# Patient Record
Sex: Female | Born: 1990 | Race: Black or African American | Hispanic: No | Marital: Single | Smoking: Never smoker
Health system: Southern US, Community
[De-identification: ages and names within clinical notes are randomized; demographics above are authoritative.]

## PROBLEM LIST (undated history)

## (undated) ENCOUNTER — Emergency Department (HOSPITAL_COMMUNITY): Discharge status: Absent without leave | Payer: Medicaid Other

## (undated) ENCOUNTER — Inpatient Hospital Stay (HOSPITAL_COMMUNITY): Payer: Self-pay

## (undated) DIAGNOSIS — R160 Hepatomegaly, not elsewhere classified: Secondary | ICD-10-CM

## (undated) DIAGNOSIS — F419 Anxiety disorder, unspecified: Secondary | ICD-10-CM

## (undated) DIAGNOSIS — J45909 Unspecified asthma, uncomplicated: Secondary | ICD-10-CM

## (undated) DIAGNOSIS — K3184 Gastroparesis: Secondary | ICD-10-CM

## (undated) DIAGNOSIS — K219 Gastro-esophageal reflux disease without esophagitis: Secondary | ICD-10-CM

## (undated) DIAGNOSIS — E109 Type 1 diabetes mellitus without complications: Secondary | ICD-10-CM

## (undated) DIAGNOSIS — I10 Essential (primary) hypertension: Secondary | ICD-10-CM

## (undated) DIAGNOSIS — K760 Fatty (change of) liver, not elsewhere classified: Secondary | ICD-10-CM

## (undated) DIAGNOSIS — E1143 Type 2 diabetes mellitus with diabetic autonomic (poly)neuropathy: Secondary | ICD-10-CM

## (undated) DIAGNOSIS — M1990 Unspecified osteoarthritis, unspecified site: Secondary | ICD-10-CM

## (undated) DIAGNOSIS — J189 Pneumonia, unspecified organism: Secondary | ICD-10-CM

## (undated) DIAGNOSIS — R1115 Cyclical vomiting syndrome unrelated to migraine: Secondary | ICD-10-CM

## (undated) DIAGNOSIS — K859 Acute pancreatitis without necrosis or infection, unspecified: Secondary | ICD-10-CM

## (undated) DIAGNOSIS — R011 Cardiac murmur, unspecified: Secondary | ICD-10-CM

## (undated) DIAGNOSIS — K8020 Calculus of gallbladder without cholecystitis without obstruction: Secondary | ICD-10-CM

## (undated) HISTORY — PX: WISDOM TOOTH EXTRACTION: SHX21

---
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
## 2009-10-10 ENCOUNTER — Ambulatory Visit: Payer: Self-pay | Admitting: "Endocrinology

## 2009-12-24 ENCOUNTER — Ambulatory Visit: Payer: Self-pay | Admitting: "Endocrinology

## 2010-01-02 ENCOUNTER — Ambulatory Visit: Payer: Self-pay | Admitting: "Endocrinology

## 2010-04-03 ENCOUNTER — Ambulatory Visit: Payer: Self-pay | Admitting: "Endocrinology

## 2010-05-07 ENCOUNTER — Ambulatory Visit: Payer: Self-pay | Admitting: "Endocrinology

## 2010-06-03 NOTE — Discharge Summary (Signed)
[name], VANORMAN NO.:  [id number]   MEDICAL RECORD NO.:  [id number]          PATIENT TYPE:  INP   LOCATION:  6123                         FACILITY:  [facility]   PHYSICIAN:  Orie Rout, M.D.DATE OF BIRTH:  [birth year]   DATE OF ADMISSION:  10/23/2007  DATE OF DISCHARGE:  11/02/2007                               DISCHARGE SUMMARY   REASON FOR HOSPITALIZATION:  Cellulitis and Type 1 Diabetes.   The patient is an 20 year old female with type 1 diabetes, hypertension,  and asthma who presented with cellulitis of the right buttocks and hip,  likely from insulin pump injection site.  The patient was initially  started on IV clindamycin and then transitioned to oral . clindamycin.  However, the patient continued to spike fevers and clinically did not  improve, so an ultrasound was obtained which did not show any abscess or  drainable fluid collection.  The patient was then switched to IV  vancomycin.  The patient continued to have fevers and then developed  right lower leg swelling.  The patient got Dopplers to rule out DVT and  MRI to rule out muscle necrosis, osteomyelitis, and myositis.  The  patient also complained of pain with inspiration.   Chest x-ray showed left pleural effusion and atelectasis.  D-dimers were  drawn and were 0.973.  Blood cultures were drawn over the course of the  patient's stay and are all no growth to date.  On day of discharge, the  patient is breathing comfortably on room air.  She is afebrile and  taking adequate p.o. intake.   OPERATIONS AND PROCEDURES:  1. Ultrasound on right buttock and hip.  2. Chest x-ray.  3. MRI of right leg.  4. Right lower leg Doppler.   FINAL DIAGNOSES:  1. Right buttock hip cellulitis  2. Parapneumonic effusion.   DISCHARGE MEDICATIONS AND INSTRUCTIONS:  1. Azithromycin 250 mg p.o. daily x3 days.  2. Linezolid 600 mg p.o. b.i.d. x7 days.   Please seek medical attention for increased work of  breathing, coughing  of blood, increased pain with breathing, or any other medical concerns.   PENDING RESULTS:  None.   FOLLOWUP:  Follow up with Dr. Cori Razor at [facility] on November 07, 2007, at  2 p.m.   DISCHARGE WEIGHT:  35 kilos.   DISCHARGE CONDITION:  Stable and improved.      Pediatrics Resident      Orie Rout, M.D.  Electronically Signed    PR/MEDQ  D:  11/02/2007  T:  11/03/2007  Job:  [id number]

## 2010-06-03 NOTE — Consult Note (Signed)
NAMEMarland Braun  [name], [name] NO.:  [id number]   MEDICAL RECORD NO.:  [id number]          PATIENT TYPE:  INP   LOCATION:  6153                         FACILITY:  [facility]   PHYSICIAN:  David Stall, M.D.DATE OF BIRTH:  25-Jan-1990   DATE OF CONSULTATION:  04/20/2007  DATE OF DISCHARGE:  04/22/2007                                 CONSULTATION   SOURCE OF CONSULTATION:  Dr. Posey Rea, pediatric teaching service.   CHIEF COMPLAINT:  Asthma, shortness of breath, type 1 diabetes mellitus,  and recurrent diabetic ketoacidosis.   HISTORY OF PRESENT ILLNESS:  [name] is a 20-year-old African-  American female.  She was interviewed both privately and in the presence  of her mother and sister.   [name] was admitted on April 19, 2007 for severe asthma exacerbation  and shortness of breath.  Three weeks prior to admission, she had an  acute gastroenteritis which resolved.  She developed URI symptoms for  several days prior to this admission.  She had an albuterol inhaler at  home but did not use it.  When she presented to the emergency room on  the day of admission, she was given prednisone 60 mg dose.   Patient has had type 1 diabetes mellitus for about four years.  She has  been on insulin pumps since early 2007 and uses Apidra insulin in her  pump.  She has a long history of noncompliance.  She has had multiple  admissions to the PICU for diabetic ketoacidosis.  Sometimes these have  been triggered by intercurrent illness, sometimes by noncompliance per  say, and sometimes by both.   She was last seen at [facility] on February 22, 2007.  Hemoglobin A1C was 11.  At that point, she was checking her CBGs 2-3 times per day.  She would  take 0-3 boluses of Apidra insulin per day.  She also appeared to be  using control solution in place of her own blood on many of the blood  tests.  She has done this in the past.  In the case, she was having many  values in the 142-149 range,  consistent with the range of the control  solution.   PAST MEDICAL HISTORY:  1. She has asthma, as noted.  2. Goiter.  3. Autonomic neuropathy with tachycardia.  4. Hypertension.  5. Oligomenorrhea.  6. Depression.  7. Surgical history:  None.  8. Allergies:  No known drug allergies.  9. GYN:  She had her last menstrual period the second week of March.      She has been fairly regular in the past few months.   SOCIAL HISTORY:  She lives with her parents and three siblings.  She is  the 11th grade.   FAMILY HISTORY:  Her mom has type 1 diabetes mellitus, which was  diagnosed in her 30s.  There is some atherosclerotic heart disease in  both paternal grandparents.   REVIEW OF SYSTEMS:  Otherwise unremarkable.   PHYSICAL EXAMINATION:  Temperature 36.5, heart rate 105, blood pressure  142/49.  Her peak blood sugar had been 497 at 1700 but it had dropped  down to the 180s range.  GENERAL:  She was alert and oriented to person, place, and time.  Her  affect was normal.  She was breathing comfortably at the time I saw her.  EYES:  The eyes were slightly dry.  MOUTH:  Slightly dry.  NECK:  She has an 18-20 gm goiter.  The goiter is nontender.  LUNGS:  Clear.  She moves air well.  HEART SOUNDS:  S1 and S2 are normal.  ABDOMEN:  Abdomen is soft and nontender.  HANDS:  She had no symptoms.  LEGS:  She had no edema.  NEUROLOGIC:  Sensation was intact in her legs.  She had 5+ strength in  her upper and lower extremities.   ASSESSMENT:  1. Diabetes mellitus:  She has poor control secondary to      noncompliance.  2. Asthma:  She had an acute exacerbation.  This improved during the      hospitalization.  3. Diabetic ketoacidosis:  Resolving.  4. Dehydration:  This was also resolving.   HOSPITAL COURSE:  We put the patient back on her insulin pump and  tapered her insulin infusion over several hours.  On the second day,  being back on her insulin pump, she had several blood glucose  values in  the 40s and 50s.  I then reduced her basal rates by approximately 15%  at each setting.  On April 22, 2007, that brought her blood glucose  values up into the 100s to 200s range.  I made some further increases in  her basal rates at 1700, at midnight, and 0200. At the time of  discharge, she was feeling well.   PLAN:  1. Patient will continue on her current insulin pump settings.  2. She will call me in the middle of the week next week to describe to      me her follow-up blood sugar pattern.  We will determine her follow-      up plan at her clinic visits at that time.           ______________________________  David Stall, M.D.     MJB/MEDQ  D:  04/22/2007  T:  04/23/2007  Job:  [id number]

## 2010-06-03 NOTE — Discharge Summary (Signed)
[name], BRYAND NO.:  [id number]   MEDICAL RECORD NO.:  [id number]          PATIENT TYPE:  INP   LOCATION:  6153                         FACILITY:  [facility]   PHYSICIAN:  Pediatrics Resident    DATE OF BIRTH:  September 24, 1990   DATE OF ADMISSION:  04/19/2007  DATE OF DISCHARGE:  04/22/2007                               DISCHARGE SUMMARY   REASON FOR HOSPITALIZATION:  Diabetic ketoacidosis and wheezing.   SIGNIFICANT FINDINGS.:  Ms. [name] is a 20 year old with known type 1  diabetes admitted with DKA and with question of wheezing.  Wheezing  resolved after first few hours of admission with albuterol and  prednisone.  DKA resolved on insulin pump with 2 bags of IV fluid  resuscitation.  She was transitioned back to her insulin pump and  stabilized on her current regimen.  Her initial pH was 7.12, bicarb was  16, and hemoglobin A1c was 10.5.  Her ketosis resolved, and she was  stable on regular diet on her pump prior to discharge.   TREATMENT:  1. Insulin pump.  2. IV fluids.  3. Albuterol.  4. Prednisone x1 dose.  5. Increased basal rate on insulin pump and restarted back on her      pump.   OPERATIONS/PROCEDURES:  None.   FINAL DIAGNOSES:  1. Diabetic ketoacidosis, resolved.  2. Type 1 diabetes.   DISCHARGE MEDICATIONS AND INSTRUCTIONS:  Insulin pump basal rate at  2400, 0.85; 0400, 1.05; 0800, 1.80; 1700, 1.05; factor 30 one U/10 g of  carbs.  Please continue carbohydrate-consistent diet.  Other medications include;  1. Lisinopril 5 mg p.o. daily.  2. Imitrex 100 mg p.o. p.r.n. migraine.  3. Albuterol MDI 2 puffs with spacer q. 4 hours p.r.n. wheezing.   PENDING RESULTS ISSUES TO BE FOLLOWED:  None.   FOLLOWUP:  1. At [facility] on April 26, 2007, at 9:30 a.m.  2. With Dr. Apolinar Junes of endocrinology.  The patient will call Tuesday      and Wednesday and follow up per Dr. Apolinar Junes.   DISCHARGE WEIGHT:  57 kg.   DISCHARGE CONDITION:  Good.   This was faxed to the primary care doctor, Dr. Carlynn Purl at [facility] and  also faxed to Dr. Apolinar Junes.      Pediatrics Resident     PR/MEDQ  D:  04/22/2007  T:  04/23/2007  Job:  [id number]

## 2010-06-06 NOTE — Discharge Summary (Signed)
NAMEMarland Kitchen  [name], [name] NO.:  [id number]   MEDICAL RECORD NO.:  [id number]          PATIENT TYPE:  INP   LOCATION:  6155                         FACILITY:  [facility]   PHYSICIAN:  Flint Melter, MD      DATE OF BIRTH:  1990-10-19   DATE OF ADMISSION:  11/27/2004  DATE OF DISCHARGE:  11/28/2004                                 DISCHARGE SUMMARY   PRIMARY CARE PHYSICIAN:  [facility].   CONSULTATIONS:  Dr. Marita Kansas, pediatric endocrinology.   REASON FOR ADMISSION:  Diabetic ketoacidosis.   HOSPITAL COURSE:  [name] is a 20 year old African-American female with  insulin-dependent diabetes who was admitted with nausea, vomiting, elevated  blood glucose to the 200s, and large ketones in urine. On presentation to  the emergency department, her initial chemistries showed an anion gap of 12  with serum bicarbonate of 12 and acidosis on her venous blood gas of  7.27/35.  She was given normal saline boluses and started on lactated  Ringers, IV fluids, and her home insulin regimen which included Lantus at  night and sliding scale insulin with carbohydrate counting.  With the IV  fluids and home regimen of insulin, her acidosis corrected, and her urine  ketones cleared.  She tolerated a regular diet and remained off IV fluids  for several hours before discharge.   FINAL DIAGNOSES:  1.  Diabetic ketoacidosis.  2.  Insulin-dependent diabetes mellitus.   DISCHARGE MEDICATIONS:  1.  Lantus 42 units subcutaneously nightly.  2.  NovoLog sliding scale insulin with carbohydrate counting.   FOLLOW UP:  Please follow up with Dr. Marita Kansas of pediatric endocrinology next  week as directed.     ______________________________  Pediatrics Resident    ______________________________  Flint Melter, MD   PR/MEDQ  D:  11/29/2004  T:  11/30/2004  Job:  [id number]   cc:   [facility]   Dr. Earl Many  FAX [phone]  [facility]

## 2010-06-06 NOTE — Discharge Summary (Signed)
NAMEMarland Braun  AYNSLEY, FLEET NO.:  [id number]   MEDICAL RECORD NO.:  [id number]          PATIENT TYPE:  INP   LOCATION:  6153                         FACILITY:  [facility]   PHYSICIAN:  Benn Moulder, M.D.      DATE OF BIRTH:  1990/12/09   DATE OF ADMISSION:  12/03/2004  DATE OF DISCHARGE:  12/05/2004                                 DISCHARGE SUMMARY   DISCHARGE DIAGNOSES:  1.  Type 1 diabetes mellitus.  2.  Diabetic ketoacidosis.   CONSULTATIONS:  1.  Dr. Fransico Michael, Pediatric Endocrinologist.  2.  Dr. Lindie Braun, Pediatric Psychologist.   ADMISSION LABORATORY DATA:  Venous pH 7.024, pCO2 22.9, sodium 132,  potassium 5.5, chloride 114, bicarb 6, BUN 21, creatinine 0.6, glucose 422.  Urine pregnancy negative. LFT's were within normal limits as were amylase  and lipase. White blood cell count 21.5. Hemoglobin and hematocrit 15.6 and  46.5. Platelets 517,000. Urinalysis showed a glucose greater than 1,000,  ketones greater than 80, and was negative for nitrite and leukocyte  esterase.   HISTORY OF PRESENT ILLNESS:  The patient is a 20 year old female with a  history of type 1 diabetes mellitus diagnosed in January of 2006, who  presented to the emergency department with vomiting and abdominal pain x1  day and was found to have ketones in her urine when she checked it at home.  Laboratory data in the emergency room showed DKA with a pH of 7.024, a  glucose of 422, and a bicarb of 6. Of note, the patient had recently been  admitted to the hospital last week and discharge on November 28, 2004 for a  similar episode of DKA.   HOSPITAL COURSE:  The patient was admitted to the Pediatric Intensive Care  Unit and re-hydrated aggressively with IV fluids. An insulin drip was run at  0.04 mg per kg per hour until the acidosis corrected. The patient was then  restarted on her home regimen of Lantus 42 units SQ q.h.s. as well as  NovoLog sliding scale correction and carb dosing. The patient  was tolerating  a regular diet prior to discharge. The patient received extensive teaching  from Dr. Fransico Michael on the importance of checking her blood sugars as directed  and taking her insulin as scheduled. The patient's mother was also made  aware of this importance and she stated that she would help out with  checking Arodhi'kas sugars, to make sure that she is compliant. The patient  also met with the Pediatric Psychologist, Dr. Lindie Braun.   DISCHARGE MEDICATIONS:  1.  Lantus 42 units SQ q.h.s.  2.  NovoLog insulin based on correction dose and carb counting as directed      by Dr. Fransico Michael.   DISCHARGE INSTRUCTIONS:  1.  The patient is to check blood sugars before and after meals and as      directed by Dr. Fransico Michael.  2.  The patient is also advised to drink plenty of fluids.   DISCHARGE WEIGHT:  48.8 kg.   CONDITION ON DISCHARGE:  Improved.   FOLLOW UP:  1.  Dr. Carlynn Purl at [facility] on December 08, 2004 at 4:15 p.m. (Fax# [phone])  2.  Dr. Fransico Michael. The patient will call his office to schedule an      appointment. (Fax# [phone])      Benn Moulder, M.D.     MR/MEDQ  D:  12/05/2004  T:  12/05/2004  Job:  [id number]   cc:   David Stall, M.D.  Fax: [phone]   Maia Breslow, M.D.  Fax: [phone]

## 2010-06-06 NOTE — Consult Note (Signed)
NAMEMarland Braun  JENNIER, [name]:  [id number]   MEDICAL RECORD NO.:  [id number]          PATIENT TYPE:  INP   LOCATION:  6120                         FACILITY:  [facility]   PHYSICIAN:  David Stall, M.D.DATE OF BIRTH:  1990-09-14   DATE OF CONSULTATION:  01/27/2006  DATE OF DISCHARGE:                                 CONSULTATION   CHIEF COMPLAINT:  This patient was admitted on January 25, 2006, for  recurrence of diabetic ketoacidosis, in association with known type 1  diabetes mellitus.   HISTORY OF PRESENT ILLNESS:  [name] is a 20 year old  African/American female.  I saw her and interviewed her privately.  Her  parents were not available at the time.  I have also reviewed the  medical record.  The patient was admitted by the emergency department  for DKA on January 25, 2006.  In retrospect, the patient complained of  having some nausea two nights prior to admission.  She felt better the  next day.  Moreover, the night prior to admission she had more nausea.  On the day of admission she had some nausea and vomiting.  She  attributed this to some stomach flu that other family members had.  She  was therefore brought to the emergency department.  In the emergency  department serum sodium was 130, potassium 4.9, chloride 106 and CO2 of  9.3.  Her glucose was 443.  Her pH was 7.178.  She was placed on an  insulin drip and given flow __________  insulin.  She was then  transferred to the floor when the DKA resolved.  The patient has been on  an insulin pump since February 20, 2005.  She has a Medtronic Mini-Med  Paradim-715 pump.  She currently uses Apidra insulin in her pump.  Her  settings are an insulin to carbohydrate ratio of 10, an insulin  sensitivity of __________  and a blood glucose target of 110.  Her basal  rates are as follows:  As of midnight her basal rate is 0.9, at 4 a.m.  is 1.1, at 8 a.m. is 1.0 and at 5 p.m. is 1.1.   The patient was last seen  on February 02, 2005.  At that time her  hemoglobin A1c was 11.  It was clear that she was not being compliant  with her insulin plan.   PAST MEDICAL HISTORY:  1. She has had type 1 diabetes mellitus for two years.  2. She has frequent hypoglycemia.  3. She also has had tachycardia, secondary to autonomic neuropathy.  4. She also has a goiter.   PAST SURGICAL HISTORY:  None.   ALLERGIES:  No known drug allergies.   REVIEW OF SYSTEMS:  GYN:  Her last menstrual period was the week prior  to Christmas.  The patient now feels well.   SOCIAL HISTORY:  She lives with her parents.  She is in the 10th grade.   FAMILY HISTORY:  The patient's mother has type 1 diabetes mellitus.   PHYSICAL EXAMINATION:  VITAL SIGNS:  Temperature 36.6 degrees C, heart  rate 86, blood pressure 101/65.  Her CBG's today were 116 and 215 in the  morning, 167 at 0725 hours in the morning and 224 at noon.  GENERAL:  She is alert and oriented to person, place and time.  She has  fair insight.  She is in no acute distress.  She admits that she has not  been taking care of herself the way she knows she should.  She is blase  about being just a teenager, and that is kind of the way teenagers are.  HEENT:  Eyes:  She has no arcus.  There is no proptosis.  Mouth:  She  has a geographic tongue.  Her mouth is slightly dry.  NECK:  There were no bruits present.  She has a 20+ gram goiter.  The  goiter is nontender.  LUNGS:  Clear.  She moves air well.  HEART:  Sounds S1 and S2 normal.  ABDOMEN:  Soft, nontender.  HANDS:  She has no tremor.  Her palms are normal.  LEGS:  There is no edema present.  NEUROLOGIC:  She has 5 positive strength in the upper and lower  extremities.  Sensation to touch is intact in her soles.   ASSESSMENT:  1. The patient has type 1 diabetes mellitus.  Her recent hemoglobin      A1c of 11.1 was a little bit better than her 11.8 in November.  She      is still, however, not taking care of  herself adequately.  2. The child was admitted with diabetic ketoacidosis.  It is unclear      whether all of her symptoms were just due to the diabetic      ketoacidosis per se, or if she developed some mild acute      gastroenteritis which tipped her over.  Since she has not had any      diarrhea and since her nausea and vomiting cleared promptly, it is      most likely that this was yet again another episode of admission of      diabetic ketoacidosis.  3. Goiter:  Her goiter is small.  She was euthyroid in October.  4. Hypoglycemia:  None lately.  5. Tachycardia, secondary to autonomic neuropathy:  This is actually      better at this point.   PLAN:  1. I have discussed her pervious course with her.  She will follow up      with me next week at [facility] as planned.  2. If the patient does not straighten out in two months period of      time, I will stop her insulin pump supplies.  She will then be back      on insulin injections daily.  I will also restrict her driving      permit through the state of [place].  I threatened her this      as a way to get her attention.  I hope it works.           ______________________________  David Stall, M.D.     MJB/MEDQ  D:  01/27/2006  T:  01/27/2006  Job:  [id number]   cc:   [facility]

## 2010-06-06 NOTE — Discharge Summary (Signed)
NAMEMarland Kitchen  [name], [name] NO.:  [id number]   MEDICAL RECORD NO.:  [id number]          PATIENT TYPE:  INP   LOCATION:  6124                         FACILITY[facility]  [facility]   PHYSICIAN:  Dyann Ruddle, MDDATE OF BIRTH:  [birth year]   DATE OF ADMISSION:  03/11/2006  DATE OF DISCHARGE:  03/12/2006                               DISCHARGE SUMMARY   PRIMARY CARE PHYSICIAN:  Dr. Cori Razor at [facility].   REASON FOR HOSPITALIZATION:  Diabetic ketoacidosis.   SIGNIFICANT FINDINGS:  The patient was admitted PICU with a capillary  blood glucose of 542, and an ABG that had a pH of 7.23 and a CO2 of 13.  The patient was also complaining of diffuse abdominal pain with some  nausea and vomiting.  Treatment was initiated with a 2 bag method for  fluids and insulin, the DKA quickly resolved and patient was transferred  to the floor on the same day.  Patient was seen by Dr. Fransico Michael and her  insulin pump restarted.  Patient's CBGs has since been well controlled  as follows, at 6 p.m. was 224, at 8 p.m. it was 239, at 10:40 p.m. it  was 212, at 2 a.m. it was 93, at 7 a.m. it was 98.  The patient's  ketones have since resolved and is being discharged home on her insulin  regimen per Dr. Fransico Michael.   OPERATION/PROCEDURE:  None.   FINAL DIAGNOSES:  1. Diabetes mellitus type 1.  2. Diabetic ketoacidosis.   DISCHARGE MEDICATIONS AND INSTRUCTIONS:  1. Insulin pump from 2 a.m. to 4 a.m. 0.59 units per hour; 4 a.m. to 8      a.m. 1.2 units per hour; 8 a.m. to 5 p.m. 2.0 units per hour; and 5      p.m. to 12 a.m. 1.2 units per hour.  2. Carb counts 1 unit for every 15 grams of carbohydrates.  3. Please call Dr. Fransico Michael with any questions or concerns.   PENDING RESULTS/PLUS ISSUES TO BE FOLLOWED:  Not applicable.   FOLLOWUP:  1. With Dr. Cori Razor, [facility], on February 25, at 2:15 p.m.  2. Patient should call Dr. Juluis Mire office to schedule a followup      appointment in 2-3  weeks.   DISCHARGE WEIGHT:  54.9 kilograms.   DISCHARGE ACTIVITIES:  Improved.           ______________________________  Dyann Ruddle, MD     LSP/MEDQ  D:  03/12/2006  T:  03/13/2006  Job:  [id number]   cc:   Maia Breslow, M.D.

## 2010-06-06 NOTE — Consult Note (Signed)
NAMEMarland Kitchen  DEANNAH, ROSSI NO.:  [id number]   MEDICAL RECORD NO.:  [id number]          PATIENT TYPE:  INP   LOCATION:  6124                         FACILITY:  [facility]   PHYSICIAN:  David Stall, M.D.DATE OF BIRTH:  08/26/1990   DATE OF CONSULTATION:  DATE OF DISCHARGE:  03/12/2006                                 CONSULTATION   PEDIATRIC ENDOCRINE CONSULTATION:   SOURCE OF CONSULTATION:  Dr. Gerome Sam with the Pediatric Intensive  Care Unit.   CHIEF COMPLAINT:  Recurrence of diabetic ketoacidosis in a 20 year old  African-American female with type 1 diabetes mellitus.   HISTORY OF PRESENT ILLNESS:  Trini is a 20 year old African-American  woman.  She was interviewed both in the morning and in the evening in  the Pediatric Intensive Care Unit.  1. The patient woke up at about 6:30 a.m. on 03/10/2006 and just did      not feel good.  She was having stomach pains and some nausea, but      no vomiting or diarrhea.  Her blood glucose at that point was in      excess of 300.  She took a correction bolus by her insulin pump but      did not take a fluid bolus and she was not eating at that time.      She checked her ketones approximately a half hour later and they      were negative.  She then checked her blood sugar about every two      hours.  Her blood sugars dropped to the high 200s but did not drop      lower.  She took insulin about two to two and a half hours.  2. The patient went to her primary care [facility] at about 1400 hours.      There the urine ketones were positive.  She was told that she was      dehydrated.  She was told to drink fluids and that she could return      home.  When she returned home, she still did not feel well so she      did not eat.  She drank diet sodas and water.  She continued to      check her blood sugars every two hours for several more hours and      took correction boluses.  Once again her blood sugars did not drop   much below the upper 200s.  She tried to nap and go to sleep but      just did not feel well.  3. She woke up this morning at approximately 0200 hours with severe      nausea and vomiting.  She vomited several times.  Blood glucose at      that point was 524.  Urine ketones were large.  Her mom very      appropriately took her to the emergency department.  She was      admitted through the Emergency Department to the Pediatric      Intensive Care Unit.  4. In the Pediatric Intensive Care Unit  she was found to be quite      dehydrated.  Blood glucose was in excess of 450.  Laboratory data      showed a sodium of 135, potassium 4.3, chloride of 103, and CO2 of      13.  The glucose was 443.  Her venous pH was 7.23.  She was judged      to be in moderate diabetic ketoacidosis.  She therefore was put on      insulin infusion.  Her insulin pump was discontinued at that point.      She was also given IV fluids, initially without glucose and then      with glucose.  5. In retrospect, the patient thinks that her last pump site change      was on Sunday or Monday.  She does not really remember      specifically.  6. The patient was last seen [facility] in January.      Her last hemoglobin A1c was in November.  At that point her      hemoglobin A1c was 11.  7. The patient has had a rather stormy course over the last two years.      She was diagnosed with type 1 diabetes mellitus approximately two      years ago in the setting of new onset type 1 and DKA.  She has had      approximately four admissions for DKA.  On at least one of these      admissions she really had a precipitating illness, but on the      others, it was essentially non compliance and non adherence to her      insulin plan.  On one admission she had been using her glucose      meter control solution and telling her mother those were her real      blood sugar readings in an attempt to fake out her mother and  to      cause her mother to allow her to stay out late and visit friends at      night.  When she checks her blood sugars and is adherent with the      insulin plan, she does very well.  However, in January it was      apparent that she was usually only checking her blood sugars about      once a day.   PAST MEDICAL HISTORY:  1. Medical:  Diabetes and migraines.  She also has a goiter.  Her      thyroid function tests on 11/16/2005 showed a TSH of 0.824, free T4      1.28, free T3 2.4.  2. Surgical:  None.  3. Allergies:  No known drug allergies.   FAMILY HISTORY:  Mother has type 1 diabetes mellitus.  There are other  family members and extended family have type 2 diabetes   SOCIAL HISTORY:  She lives with her five brothers, mom, and dad.  She is  in the 10th grade.  She is an Chief Executive Officer.   REVIEW OF SYSTEMS:  She feels really well tonight.  The remainder of the  review of systems is entirely normal.   PHYSICAL EXAMINATION:  VITAL SIGNS:  Temperature at 97, heart rate 104,  blood pressure 128/80.  CBGs since transfer from the Pediatric Intensive  Care Unit to the ward have all been in the middle 200s.  GENERAL:  She is alert,  bright, and active.  She is in no acute  distress.  HEENT:  Eyes:  Eyes were initially dry this morning when I first saw her  in the PICU but later much improved.  In a similar way, her mouth was  quite dry this morning but has improved substantially since then.  NECK:  Shows no bruits present.  She has a 20-25 gm goiter.  The goiter  is nontender.  LUNGS:  Lungs are clear.  She moves them well.  HEART:  Heart sounds S1 and S2 are normal.  ABDOMEN:  The abdomen is soft and nontender.  HANDS:  There was no tremor.  Her upper arms are normal.  LEGS:  There is no edema.  NEUROLOGIC EXAM:  She has 5+ strength in her upper and lower  extremities.  She had normal sensation to touch in her legs.   ASSESSMENT: 1. Diabetic ketoacidosis and type 1 diabetes  mellitus.      a.     I suspect that the pump site was into its fourth day of       operation and was not working as well as it should.  She was not       getting good blood glucose control yesterday.  Because she was not       eating, her blood sugars to the lower 200s.  She was at that point       afraid to take insulin, so she did not take adequate amounts of       insulin.  In this setting, evolving ketoacidosis or inadequate       insulinization resulted in progression of the DKA.  By the time       she woke up early this morning with nausea and vomiting, she was       in full blown DKA.      b.     Tomeko is a bright kid.  At times her creativity in       dodging her diabetes healthcare responsibilities has gotten her       into severe trouble.  Ironically, she is bright enough to do       really well in diabetes health care when she finally decides to do       them.  2. Dehydration:  Resolving.  3. Goiter:  She was euthyroid in November of 2007.  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  This gives her two different      autoimmune diseases: Type 1 diabetes mellitus and Hashimoto      disease.   PLAN:  1. I think she can safely be discharged in the morning.  2. To preclude late night hypoglycemia, I have changed her correction      bolus targets as of tonight.  Her current targets are: At 2400,      target is 150.  Below 600, the target is 110.  At 2100, the target      is 150.  3. I have continued her at her current insulin pump settings.  Her      insulin sensitivity factor is 30.  Her insulin/carb ratio is 10.  4. Patient will follow up with me in the [facility] in      [place].           ______________________________  David Stall, M.D.     MJB/MEDQ  D:  03/11/2006  T:  03/12/2006  Job:  [id number]   cc:   Dr. T[facility]

## 2010-06-06 NOTE — Discharge Summary (Signed)
NAME:  BURNIE, HANK NO.:  [id number]   MEDICAL RECORD NO.:  [id number]          PATIENT TYPE:  INP   LOCATION:  6120                         FACILITY:  [facility]   PHYSICIAN:  Pediatrics Resident    DATE OF BIRTH:  [birth year]   DATE OF ADMISSION:  01/25/2006  DATE OF DISCHARGE:  01/27/2006                               DISCHARGE SUMMARY   REASON FOR ADMISSION:  This is a 20 year old female with a history of  type 1 diabetes mellitus that was diagnosed 2 years ago, who presented  to the emergency department in DKA who was also noted to not be doing  her accurate carb-counting or correction doses of her insulin.   SIGNIFICANT PHYSICAL FINDINGS:  On admission, the patient had malaise  and emesis with a CBG of 490 and urinary ketones.  She had a normal  physical exam and her vital signs were stable with the exception of her  pulse being 114.  She at home is on basal insulin of 2 units per hour  with correction doses and carb counting.  Her bicarb on admission was  9.3 with an anion gap of 15, and a pH of 7.178.  Her white blood cell  count was 16.2.  Her BMPs were followed and her bicarb normalized (it  was 26 on discharge).  Her A1c is 11.1.  Her CBGs were 116 to 254 in the  24 hours leading up to discharge.  Dr. Fransico Michael was consulted as he has  been following this patient outside the hospital and said that he would  follow up with her outside of this hospitalization as well.  He stated  that if she continues to be nonadherent with this treatment and plan for  the next 2 months he will change her back to her subcutaneous insulin as  she had previously been on.  She had good p.o. intake prior to  discharge.   TREATMENT:  Patient was treated with the 2-bag method for treating DKA  with aggressive rehydration.  She was also given insulin 0.05 units per  kilo per hour and then changed over to her home regimen on the insulin  pump with correction doses and carb counting.   She was given Zofran as  needed for nausea.   OPERATION/PROCEDURE:  None.   FINAL DIAGNOSES:  1. Diabetic ketoacidosis.  2. Diabetes mellitus type 1.  3. Questionable viral gastroenteritis.   DISCHARGE MEDICATIONS/INSTRUCTIONS:  Insulin as per home regimen.   PENDING RESULTS/ISSUES TO BE FOLLOWED:  None.   FOLLOWUP:  Follow up with Dr. Carlynn Purl at 1:30 p.m. on February 01, 2006.  Patient is also to follow up with Dr. Fransico Michael at the time he had set up  with the patient.   DISCHARGE WEIGHT:  52.4 kg.   CONDITION ON DISCHARGE:  Good.           ______________________________  Pediatrics Resident     PR/MEDQ  D:  01/27/2006  T:  01/28/2006  Job:  [id number]   cc:   Maia Breslow, M.D.  David Stall, M.D.

## 2010-06-19 ENCOUNTER — Encounter: Payer: Self-pay | Admitting: Pediatrics

## 2010-06-19 DIAGNOSIS — I10 Essential (primary) hypertension: Secondary | ICD-10-CM | POA: Insufficient documentation

## 2010-06-19 DIAGNOSIS — E049 Nontoxic goiter, unspecified: Secondary | ICD-10-CM | POA: Insufficient documentation

## 2010-06-19 DIAGNOSIS — E1065 Type 1 diabetes mellitus with hyperglycemia: Secondary | ICD-10-CM

## 2010-07-22 ENCOUNTER — Telehealth: Payer: Self-pay | Admitting: *Deleted

## 2010-07-22 ENCOUNTER — Encounter: Payer: Self-pay | Admitting: *Deleted

## 2010-07-22 NOTE — Telephone Encounter (Signed)
6 refils given on Novolog RX.   Pt's insurance only covers Novolog vials.

## 2010-08-12 ENCOUNTER — Ambulatory Visit: Payer: Self-pay | Admitting: "Endocrinology

## 2010-10-10 LAB — URINE MICROSCOPIC-ADD ON

## 2010-10-10 LAB — APTT: aPTT: 27

## 2010-10-10 LAB — I-STAT 8, (EC8 V) (CONVERTED LAB)
Acid-base deficit: 3 — ABNORMAL HIGH
Bicarbonate: 24.8 — ABNORMAL HIGH
Chloride: 103
Glucose, Bld: 139 — ABNORMAL HIGH
Hemoglobin: 15.3
Operator id: 151321
Sodium: 136
pCO2, Ven: 51.2 — ABNORMAL HIGH
pH, Ven: 7.293

## 2010-10-10 LAB — URINE CULTURE

## 2010-10-10 LAB — HEPATIC FUNCTION PANEL: Albumin: 3.6

## 2010-10-10 LAB — DIFFERENTIAL
Basophils Relative: 0
Eosinophils Absolute: 0.2
Eosinophils Relative: 2
Lymphs Abs: 0.7 — ABNORMAL LOW
Monocytes Relative: 6

## 2010-10-10 LAB — POCT I-STAT CREATININE
Creatinine, Ser: 0.8
Operator id: 151321

## 2010-10-10 LAB — CBC
HCT: 41.1
MCV: 90.2
RDW: 12.8

## 2010-10-10 LAB — URINALYSIS, ROUTINE W REFLEX MICROSCOPIC
Bilirubin Urine: NEGATIVE
Ketones, ur: 15 — AB
Specific Gravity, Urine: 1.022
Urobilinogen, UA: 1

## 2010-10-10 LAB — PREGNANCY, URINE: Preg Test, Ur: NEGATIVE

## 2010-10-13 LAB — BASIC METABOLIC PANEL
BUN: 10
BUN: 11
BUN: 13
BUN: 9
CO2: 10 — ABNORMAL LOW
CO2: 11 — ABNORMAL LOW
CO2: 14 — ABNORMAL LOW
CO2: 16 — ABNORMAL LOW
Calcium: 8.6
Calcium: 8.9
Calcium: 9.1
Calcium: 9.1
Calcium: 9.2
Chloride: 110
Chloride: 97
Creatinine, Ser: 1
Creatinine, Ser: 1.05
Glucose, Bld: 269 — ABNORMAL HIGH
Glucose, Bld: 366 — ABNORMAL HIGH
Glucose, Bld: 377 — ABNORMAL HIGH
Glucose, Bld: 510
Glucose, Bld: 518
Potassium: 3.7
Potassium: 5.4 — ABNORMAL HIGH
Sodium: 131 — ABNORMAL LOW
Sodium: 133 — ABNORMAL LOW
Sodium: 134 — ABNORMAL LOW

## 2010-10-13 LAB — POCT I-STAT EG7
Acid-base deficit: 16 — ABNORMAL HIGH
Acid-base deficit: 20 — ABNORMAL HIGH
Bicarbonate: 12.8 — ABNORMAL LOW
Calcium, Ion: 1.32
Calcium, Ion: 1.33 — ABNORMAL HIGH
Calcium, Ion: 1.34 — ABNORMAL HIGH
HCT: 39
Hemoglobin: 13.3
Hemoglobin: 14.3
O2 Saturation: 78
O2 Saturation: 93
Operator id: 177261
Potassium: 4.9
Potassium: 5
Potassium: 5.5 — ABNORMAL HIGH
Sodium: 135
Sodium: 138
TCO2: 10
pCO2, Ven: 23.9 — ABNORMAL LOW
pH, Ven: 7.206 — ABNORMAL LOW
pH, Ven: 7.267
pO2, Ven: 32
pO2, Ven: 56 — ABNORMAL HIGH

## 2010-10-13 LAB — POCT I-STAT, CHEM 8
BUN: 15
Calcium, Ion: 1.15
Calcium, Ion: 1.36 — ABNORMAL HIGH
Creatinine, Ser: 0.8
Glucose, Bld: 524
HCT: 43
Hemoglobin: 14.6
Hemoglobin: 16
Sodium: 133 — ABNORMAL LOW
Sodium: 136
TCO2: 18
TCO2: 9

## 2010-10-13 LAB — POCT I-STAT 3, VENOUS BLOOD GAS (G3P V)
Acid-base deficit: 11 — ABNORMAL HIGH
Acid-base deficit: 18 — ABNORMAL HIGH
Bicarbonate: 16.7 — ABNORMAL LOW
Bicarbonate: 9.9 — ABNORMAL LOW
Operator id: 294521
TCO2: 11
TCO2: 18
pO2, Ven: 41

## 2010-10-13 LAB — HEMOGLOBIN A1C: Hgb A1c MFr Bld: 10.5 — ABNORMAL HIGH

## 2010-10-13 LAB — CBC
HCT: 39
Hemoglobin: 13.1
MCHC: 33.7
Platelets: 320
RDW: 13.4

## 2010-10-13 LAB — URINE MICROSCOPIC-ADD ON

## 2010-10-13 LAB — DIFFERENTIAL
Basophils Absolute: 0
Basophils Relative: 0
Eosinophils Relative: 0
Lymphocytes Relative: 4 — ABNORMAL LOW
Monocytes Absolute: 0.1 — ABNORMAL LOW
Neutro Abs: 15.9 — ABNORMAL HIGH

## 2010-10-13 LAB — URINALYSIS, ROUTINE W REFLEX MICROSCOPIC: Leukocytes, UA: NEGATIVE

## 2010-10-13 LAB — RAPID URINE DRUG SCREEN, HOSP PERFORMED
Barbiturates: NOT DETECTED
Cocaine: NOT DETECTED
Opiates: NOT DETECTED
Tetrahydrocannabinol: NOT DETECTED

## 2010-10-14 LAB — POCT I-STAT EG7
Acid-base deficit: 6 — ABNORMAL HIGH
Bicarbonate: 17.9 — ABNORMAL LOW
HCT: 30 — ABNORMAL LOW
Hemoglobin: 10.2 — ABNORMAL LOW
O2 Saturation: 55
Operator id: 130981
Patient temperature: 36.5
Potassium: 4.3
Potassium: 4.6
Sodium: 140
TCO2: 19
pCO2, Ven: 34.1 — ABNORMAL LOW
pH, Ven: 7.351 — ABNORMAL HIGH

## 2010-10-14 LAB — BASIC METABOLIC PANEL
BUN: 8
BUN: 9
Calcium: 8.8
Calcium: 9
Creatinine, Ser: 0.59
Creatinine, Ser: 0.62
Glucose, Bld: 187 — ABNORMAL HIGH

## 2010-10-16 LAB — RAPID STREP SCREEN (MED CTR MEBANE ONLY): Streptococcus, Group A Screen (Direct): NEGATIVE

## 2010-10-20 LAB — GLUCOSE, CAPILLARY
Glucose-Capillary: 202 — ABNORMAL HIGH
Glucose-Capillary: 115 — ABNORMAL HIGH
Glucose-Capillary: 119 — ABNORMAL HIGH
Glucose-Capillary: 142 — ABNORMAL HIGH
Glucose-Capillary: 155 — ABNORMAL HIGH
Glucose-Capillary: 160 — ABNORMAL HIGH
Glucose-Capillary: 167 — ABNORMAL HIGH
Glucose-Capillary: 169 — ABNORMAL HIGH
Glucose-Capillary: 173 — ABNORMAL HIGH
Glucose-Capillary: 189 — ABNORMAL HIGH
Glucose-Capillary: 197 — ABNORMAL HIGH
Glucose-Capillary: 208 — ABNORMAL HIGH
Glucose-Capillary: 210 — ABNORMAL HIGH
Glucose-Capillary: 210 — ABNORMAL HIGH
Glucose-Capillary: 218 — ABNORMAL HIGH
Glucose-Capillary: 227 — ABNORMAL HIGH
Glucose-Capillary: 228 — ABNORMAL HIGH
Glucose-Capillary: 250 — ABNORMAL HIGH
Glucose-Capillary: 254 — ABNORMAL HIGH
Glucose-Capillary: 260 — ABNORMAL HIGH
Glucose-Capillary: 260 — ABNORMAL HIGH
Glucose-Capillary: 265 — ABNORMAL HIGH
Glucose-Capillary: 266 — ABNORMAL HIGH
Glucose-Capillary: 268 — ABNORMAL HIGH
Glucose-Capillary: 274 — ABNORMAL HIGH
Glucose-Capillary: 275 — ABNORMAL HIGH
Glucose-Capillary: 285 — ABNORMAL HIGH
Glucose-Capillary: 294 — ABNORMAL HIGH
Glucose-Capillary: 304 — ABNORMAL HIGH
Glucose-Capillary: 311 — ABNORMAL HIGH
Glucose-Capillary: 329 — ABNORMAL HIGH
Glucose-Capillary: 368 — ABNORMAL HIGH
Glucose-Capillary: 83
Glucose-Capillary: 93
Glucose-Capillary: 96

## 2010-10-20 LAB — CBC
HCT: 43.8
HCT: 34 — ABNORMAL LOW
HCT: 36.2
Hemoglobin: 15.1
Hemoglobin: 12.5
MCV: 89.5
MCV: 90.2
MCV: 90.6
Platelets: 290
Platelets: 362
RBC: 3.77 — ABNORMAL LOW
RBC: 4.4
WBC: 7.5
WBC: 12.7
WBC: 15.4 — ABNORMAL HIGH
WBC: 20.1 — ABNORMAL HIGH

## 2010-10-20 LAB — URINALYSIS, ROUTINE W REFLEX MICROSCOPIC
Bilirubin Urine: NEGATIVE
Glucose, UA: >1000 — AB
Nitrite: NEGATIVE
Protein, ur: 30 — AB
Specific Gravity, Urine: >1.046 — ABNORMAL HIGH
pH: 6

## 2010-10-20 LAB — WOUND CULTURE

## 2010-10-20 LAB — DIFFERENTIAL
Basophils Absolute: 0
Basophils Relative: 0
Basophils Relative: 1
Eosinophils Relative: 0
Eosinophils Relative: 1
Eosinophils Relative: 2
Lymphocytes Relative: 48
Lymphocytes Relative: 56 — ABNORMAL HIGH
Lymphs Abs: 6.9 — ABNORMAL HIGH
Monocytes Absolute: 1.1 — ABNORMAL HIGH
Monocytes Relative: 2 — ABNORMAL LOW
Monocytes Relative: 7
Neutro Abs: 4.3
Neutro Abs: 6.9
Neutrophils Relative %: 57
Neutrophils Relative %: 38 — ABNORMAL LOW

## 2010-10-20 LAB — COMPREHENSIVE METABOLIC PANEL
ALT: 68 — ABNORMAL HIGH
AST: 60 — ABNORMAL HIGH
Alkaline Phosphatase: 114
Alkaline Phosphatase: 179 — ABNORMAL HIGH
BUN: 11
CO2: 26
Calcium: 8 — ABNORMAL LOW
Chloride: 100
Creatinine, Ser: 0.9
Glucose, Bld: 219 — ABNORMAL HIGH
Glucose, Bld: 259 — ABNORMAL HIGH
Potassium: 3.7
Potassium: 3.9
Sodium: 133 — ABNORMAL LOW
Total Bilirubin: 0.9
Total Protein: 5.6 — ABNORMAL LOW

## 2010-10-20 LAB — POCT I-STAT, CHEM 8
BUN: 4 — ABNORMAL LOW
Calcium, Ion: 1.09 — ABNORMAL LOW
Creatinine, Ser: 0.9
TCO2: 25

## 2010-10-20 LAB — URINE MICROSCOPIC-ADD ON

## 2010-10-20 LAB — CULTURE, BLOOD (SINGLE): Culture: NO GROWTH

## 2010-10-20 LAB — CULTURE, BLOOD (ROUTINE X 2): Culture: NO GROWTH

## 2010-10-20 LAB — D-DIMER, QUANTITATIVE: D-Dimer, Quant: 0.97 — ABNORMAL HIGH

## 2010-10-20 LAB — LACTIC ACID, PLASMA: Lactic Acid, Venous: 1.2

## 2010-10-20 LAB — GRAM STAIN

## 2010-10-20 LAB — C-REACTIVE PROTEIN: CRP: 4.1 — ABNORMAL HIGH (ref <0.6)

## 2010-10-20 LAB — CK TOTAL AND CKMB (NOT AT ARMC): CK, MB: 0.6

## 2010-10-20 LAB — PATHOLOGIST SMEAR REVIEW

## 2010-10-20 LAB — URINE CULTURE: Colony Count: >=100000

## 2010-11-06 LAB — CULTURE, ROUTINE-ABSCESS

## 2010-11-13 ENCOUNTER — Ambulatory Visit: Payer: Self-pay | Admitting: "Endocrinology

## 2011-01-20 NOTE — L&D Delivery Note (Signed)
Delivery Note At 9:42 PM a viable, healthy, late preterm, and LGA female was delivered via Vaginal, Vacuum (Extractor) (Presentation: Left Occiput Anterior).  +3 station, pushed for 3 hours.  APGAR: 8, 8; weight 7 lb 7.2 oz (3380 g).   Placenta status: Intact, Spontaneous.  Cord: 3 vessels.  Uterus explored, Cervix inspected.  Anesthesia: Epidural  Episiotomy: None Lacerations: 2nd degree;Perineal Suture Repair: 3.0 chromic Est. Blood Loss (mL): 400  Mom to postpartum.  Baby to nursery-stable.  Malique Driskill D 01/06/2012, 10:17 PM

## 2011-02-12 ENCOUNTER — Ambulatory Visit: Payer: Self-pay | Admitting: "Endocrinology

## 2011-03-02 ENCOUNTER — Other Ambulatory Visit: Payer: Self-pay | Admitting: *Deleted

## 2011-03-02 DIAGNOSIS — E1065 Type 1 diabetes mellitus with hyperglycemia: Secondary | ICD-10-CM

## 2011-03-02 MED ORDER — INSULIN ASPART 100 UNIT/ML ~~LOC~~ SOLN: SUBCUTANEOUS | Status: DC

## 2011-03-03 ENCOUNTER — Other Ambulatory Visit: Payer: Self-pay | Admitting: *Deleted

## 2011-03-03 DIAGNOSIS — E1065 Type 1 diabetes mellitus with hyperglycemia: Secondary | ICD-10-CM

## 2011-03-03 MED ORDER — INSULIN ASPART 100 UNIT/ML ~~LOC~~ SOLN: SUBCUTANEOUS | Status: DC

## 2011-08-26 LAB — OB RESULTS CONSOLE HEPATITIS B SURFACE ANTIGEN: Hepatitis B Surface Ag: NEGATIVE

## 2011-08-26 LAB — OB RESULTS CONSOLE ANTIBODY SCREEN: Antibody Screen: NEGATIVE

## 2011-08-26 LAB — OB RESULTS CONSOLE ABO/RH

## 2011-08-26 LAB — OB RESULTS CONSOLE HIV ANTIBODY (ROUTINE TESTING): HIV: NONREACTIVE

## 2011-08-26 LAB — OB RESULTS CONSOLE RUBELLA ANTIBODY, IGM: Rubella: IMMUNE

## 2011-08-31 ENCOUNTER — Encounter (HOSPITAL_COMMUNITY): Payer: Self-pay | Admitting: Neurology

## 2011-08-31 ENCOUNTER — Emergency Department (HOSPITAL_COMMUNITY)
Admission: EM | Admit: 2011-08-31 | Discharge: 2011-08-31 | Disposition: A | Discharge status: Home / Self Care | Payer: BC Managed Care – PPO | Attending: Emergency Medicine | Admitting: Emergency Medicine

## 2011-08-31 DIAGNOSIS — J45909 Unspecified asthma, uncomplicated: Secondary | ICD-10-CM | POA: Insufficient documentation

## 2011-08-31 DIAGNOSIS — R51 Headache: Secondary | ICD-10-CM | POA: Insufficient documentation

## 2011-08-31 DIAGNOSIS — Z9641 Presence of insulin pump (external) (internal): Secondary | ICD-10-CM | POA: Insufficient documentation

## 2011-08-31 DIAGNOSIS — E119 Type 2 diabetes mellitus without complications: Secondary | ICD-10-CM | POA: Insufficient documentation

## 2011-08-31 HISTORY — DX: Unspecified asthma, uncomplicated: J45.909

## 2011-08-31 LAB — POCT I-STAT, CHEM 8
BUN: 7 mg/dL (ref 6–23)
Calcium, Ion: 1.25 mmol/L — ABNORMAL HIGH (ref 1.12–1.23)
Creatinine, Ser: 0.6 mg/dL (ref 0.50–1.10)
TCO2: 21 mmol/L (ref 0–100)

## 2011-08-31 MED ORDER — METOCLOPRAMIDE HCL 5 MG/ML IJ SOLN
10.0000 mg | Freq: Once | INTRAMUSCULAR | Status: AC
  Administered 2011-08-31: 10 mg via INTRAVENOUS
  Filled 2011-08-31: qty 2

## 2011-08-31 MED ORDER — SODIUM CHLORIDE 0.9 % IV BOLUS (SEPSIS)
1000.0000 mL | Freq: Once | INTRAVENOUS | Status: AC
  Administered 2011-08-31: 1000 mL via INTRAVENOUS

## 2011-08-31 NOTE — ED Notes (Signed)
Pt reporting h/a x 1 week, comes and goes, reporting nausea/vomiting, pt is 4 months pregnant, denying any abdominal pain/vaginal bleeding. Headache pain is frontal, photophobia. 9/10 h/a. A x 4. Hx of migranes, has been taking tylenol for pain. Last took last night at 11 pm

## 2011-08-31 NOTE — ED Provider Notes (Addendum)
History     CSN: [id number]  Arrival date & time 08/31/11  0909   First MD Initiated Contact with Patient 08/31/11 [phone]      Chief Complaint  Patient presents with  . Headache    (Consider location/radiation/quality/duration/timing/severity/associated sxs/prior treatment) HPI Complains of throbbing headache gradual onset yesterday. Symptoms accompanied by 4 episodes of vomiting 4 or 5 episodes of diarrhea. Treated with Tylenol and Excedrin PM, without relief. No other associated symptoms. Past Medical History  Diagnosis Date  . Diabetes mellitus   . Asthma    Migraine headache History reviewed. No pertinent past surgical history.  No family history on file.  History  Substance Use Topics  . Smoking status: Never Smoker   . Smokeless tobacco: Not on file  . Alcohol Use: No   no drug use  OB History    Grav Para Term Preterm Abortions TAB SAB Ect Mult Living   1               Review of Systems  Constitutional: Negative.   Respiratory: Negative.   Cardiovascular: Negative.   Gastrointestinal: Positive for nausea, vomiting and diarrhea.  Genitourinary:       Currently 4 months pregnant  Musculoskeletal: Negative.   Skin: Negative.   Neurological: Positive for headaches.  Hematological: Negative.   Psychiatric/Behavioral: Negative.   All other systems reviewed and are negative.    Allergies  Review of patient's allergies indicates no known allergies.  Home Medications   Current Outpatient Rx  Name Route Sig Dispense Refill  . ALBUTEROL SULFATE HFA 108 (90 BASE) MCG/ACT IN AERS Inhalation Inhale 2 puffs into the lungs every 6 (six) hours as needed. For shortness of breath    . BECLOMETHASONE DIPROPIONATE 40 MCG/ACT IN AERS Inhalation Inhale 2 puffs into the lungs 2 (two) times daily.    [name] FLUTICASONE PROPIONATE 50 MCG/ACT NA SUSP Nasal Place 2 sprays into the nose daily.    . INSULIN PUMP Subcutaneous Inject 1 each into the skin continuous. Novolog in pump     . PRENATAL MULTIVITAMIN CH Oral Take 1 tablet by mouth daily.      BP 118/69  Pulse 98  Temp 98.6 F (37 C) (Oral)  Resp 16  SpO2 100%  Physical Exam  Nursing note and vitals reviewed. Constitutional: She is oriented to person, place, and time. She appears well-developed and well-nourished.  HENT:  Head: Normocephalic and atraumatic.  Eyes: Conjunctivae are normal. Pupils are equal, round, and reactive to light.  Neck: Neck supple. No tracheal deviation present. No thyromegaly present.  Cardiovascular: Normal rate and regular rhythm.   No murmur heard. Pulmonary/Chest: Effort normal and breath sounds normal.  Abdominal: Soft. Bowel sounds are normal. She exhibits no distension. There is no tenderness.       Fetal heart tones 168  Musculoskeletal: Normal range of motion. She exhibits no edema and no tenderness.  Neurological: She is alert and oriented to person, place, and time. She has normal reflexes. Coordination normal.       Gait normal Romberg normal prior drift normal  Skin: Skin is warm and dry. No rash noted.  Psychiatric: She has a normal mood and affect.    ED Course  Procedures (including critical care time)  Labs Reviewed - No data to display No results found. 30 p.m. feels much improved after treatment with intravenous Reglan and intravenous fluids.Nausea has resolved. Awake alert Glasgow Coma Score 15  No diagnosis found.  Results for orders placed  during the hospital encounter of 08/31/11  POCT I-STAT, CHEM 8      Component Value Range   Sodium 140  135 - 145 mEq/L   Potassium 3.5  3.5 - 5.1 mEq/L   Chloride 104  96 - 112 mEq/L   BUN 7  6 - 23 mg/dL   Creatinine, Ser 1.61  0.50 - 1.10 mg/dL   Glucose, Bld 096 (*) 70 - 99 mg/dL   Calcium, Ion 0.45 (*) 1.12 - 1.23 mmol/L   TCO2 21  0 - 100 mmol/L   Hemoglobin 12.6  12.0 - 15.0 g/dL   HCT 40.9  81.1 - 91.4 %   No results found.   MDM  Plan Tylenol for pain Diagnosis #1 nonspecific headache #2  hyperglycemia        Doug Sou, MD 08/31/11 1237  Doug Sou, MD 08/31/11 1238

## 2011-10-14 ENCOUNTER — Ambulatory Visit (INDEPENDENT_AMBULATORY_CARE_PROVIDER_SITE_OTHER): Payer: BC Managed Care – PPO | Admitting: Cardiovascular Disease

## 2011-10-14 ENCOUNTER — Encounter: Payer: Self-pay | Admitting: Cardiovascular Disease

## 2011-10-14 VITALS — BP 106/64 | HR 97 | Ht 64.0 in | Wt 143.0 lb

## 2011-10-14 DIAGNOSIS — R0989 Other specified symptoms and signs involving the circulatory and respiratory systems: Secondary | ICD-10-CM

## 2011-10-14 DIAGNOSIS — R0600 Dyspnea, unspecified: Secondary | ICD-10-CM | POA: Insufficient documentation

## 2011-10-14 DIAGNOSIS — R011 Cardiac murmur, unspecified: Secondary | ICD-10-CM

## 2011-10-14 NOTE — Assessment & Plan Note (Signed)
I suspect that the patient's increased dyspnea and lower extremity edema is a physiologic response to her pregnancy. However, she reports having a cardiac murmur which she was born. She does have a faint murmur on exam currently but this seems to be a physiologic flow murmur. Nonetheless, I think it's important to rule out structural or valvular heart abnormalities. Thus, I will obtain an echocardiogram for evaluation. If her echocardiogram is normal, no further cardiac workup will be needed.

## 2011-10-14 NOTE — Patient Instructions (Addendum)
Your physician has requested that you have an echocardiogram. Echocardiography is a painless test that uses sound waves to create images of your heart. It provides your doctor with information about the size and shape of your heart and how well your heart's chambers and valves are working. This procedure takes approximately one hour. There are no restrictions for this procedure.  Your physician recommends that you schedule a follow-up appointment in: as needed  

## 2011-10-14 NOTE — Progress Notes (Signed)
HPI  This is a 21 year old female who is here today for consultation regarding dyspnea and a high risk pregnancy. She is [redacted] weeks pregnant. She has a history of type 1 diabetes. She was also told in the past that she had a cardiac murmur when she was born. She used to be followed up regularly for that condition up until she was 21 years old. No cardiac workup since then. She reports progressive dyspnea over the last few weeks. She also has noticed significant lower extremity edema especially after standing up for a long time. She denies any chest pain or palpitations. She has no history of hypertension. No tobacco or alcohol use.  No Known Allergies   Current Outpatient Prescriptions on File Prior to Visit  Medication Sig Dispense Refill  . albuterol (PROVENTIL HFA;VENTOLIN HFA) 108 (90 BASE) MCG/ACT inhaler Inhale 2 puffs into the lungs every 6 (six) hours as needed. For shortness of breath      . beclomethasone (QVAR) 40 MCG/ACT inhaler Inhale 2 puffs into the lungs 2 (two) times daily.      . fluticasone (FLONASE) 50 MCG/ACT nasal spray Place 2 sprays into the nose daily.      . insulin glargine (LANTUS) 100 UNIT/ML injection Inject into the skin as directed.      . Prenatal Vit-Fe Fumarate-FA (PRENATAL MULTIVITAMIN) TABS Take 1 tablet by mouth daily.         Past Medical History  Diagnosis Date  . Diabetes mellitus   . Asthma      No past surgical history on file.   History reviewed. No pertinent family history.   History   Social History  . Marital Status: Single    Spouse Name: N/A    Number of Children: N/A  . Years of Education: N/A   Occupational History  . Not on file.   Social History Main Topics  . Smoking status: Never Smoker   . Smokeless tobacco: Not on file  . Alcohol Use: No  . Drug Use: No  . Sexually Active:    Other Topics Concern  . Not on file   Social History Narrative  . No narrative on file     ROS Constitutional: Negative for  fever, chills, diaphoresis, activity change, appetite change . HENT: Negative for hearing loss, nosebleeds, congestion, sore throat, facial swelling, drooling, trouble swallowing, neck pain, voice change, sinus pressure and tinnitus.  Eyes: Negative for photophobia, pain, discharge and visual disturbance.  Respiratory: Negative for apnea, cough, chest tightness and wheezing.  Cardiovascular: Negative for chest pain, palpitations. Gastrointestinal: Negative for nausea, vomiting, abdominal pain, diarrhea, constipation, blood in stool and abdominal distention.  Genitourinary: Negative for dysuria, urgency, frequency, hematuria and decreased urine volume.  Musculoskeletal: Negative for myalgias, back pain, joint swelling, arthralgias and gait problem.  Skin: Negative for color change, pallor, rash and wound.  Neurological: Negative for dizziness, tremors, seizures, syncope, speech difficulty, weakness, light-headedness, numbness and headaches.  Psychiatric/Behavioral: Negative for suicidal ideas, hallucinations, behavioral problems and agitation. The patient is not nervous/anxious.     PHYSICAL EXAM   BP 106/64  Pulse 97  Ht 5\' 4"  (1.626 m)  Wt 143 lb (64.864 kg)  BMI 24.55 kg/m2 Constitutional: She is oriented to person, place, and time. She appears well-developed and well-nourished. No distress.  HENT: No nasal discharge.  Head: Normocephalic and atraumatic.  Eyes: Pupils are equal and round. Right eye exhibits no discharge. Left eye exhibits no discharge.  Neck: Normal range of  motion. Neck supple. No JVD present. No thyromegaly present.  Cardiovascular: Normal rate, regular rhythm, normal heart sounds. Exam reveals no gallop and no friction rub. Is a 1/6 systolic ejection murmur at the aortic area which seems to be a flow murmur. There is normal splitting of S2.  Pulmonary/Chest: Effort normal and breath sounds normal. No stridor. No respiratory distress. She has no wheezes. She has no  rales. She exhibits no tenderness.  Abdominal: Soft. Bowel sounds are normal. She exhibits no distension. There is no tenderness. There is no rebound and no guarding.  Musculoskeletal: Normal range of motion. She exhibits +1 edema and no tenderness.  Neurological: She is alert and oriented to person, place, and time. Coordination normal.  Skin: Skin is warm and dry. No rash noted. She is not diaphoretic. No erythema. No pallor.  Psychiatric: She has a normal mood and affect. Her behavior is normal. Judgment and thought content normal.     EKG: Normal sinus rhythm with no significant ST or T wave changes. Normal PR and QT intervals.   ASSESSMENT AND PLAN

## 2011-10-15 ENCOUNTER — Telehealth: Payer: Self-pay | Admitting: *Deleted

## 2011-10-15 NOTE — Telephone Encounter (Signed)
Telephone call to patient re Diabetes Testing Supplies Refill Request from [facility].  Pt not seen since 01/02/2010 per EPIC.  Per pt, she was discharged from Dr. Juluis Mire care in Jan. 2013.  [facility] Request was shredded.

## 2011-10-28 ENCOUNTER — Other Ambulatory Visit: Payer: Self-pay

## 2011-10-28 ENCOUNTER — Ambulatory Visit (HOSPITAL_COMMUNITY): Payer: BC Managed Care – PPO | Attending: Cardiovascular Disease | Admitting: Radiology

## 2011-10-28 DIAGNOSIS — R0989 Other specified symptoms and signs involving the circulatory and respiratory systems: Secondary | ICD-10-CM | POA: Insufficient documentation

## 2011-10-28 DIAGNOSIS — I059 Rheumatic mitral valve disease, unspecified: Secondary | ICD-10-CM | POA: Insufficient documentation

## 2011-10-28 DIAGNOSIS — E119 Type 2 diabetes mellitus without complications: Secondary | ICD-10-CM | POA: Insufficient documentation

## 2011-10-28 DIAGNOSIS — R0600 Dyspnea, unspecified: Secondary | ICD-10-CM

## 2011-10-28 DIAGNOSIS — R0609 Other forms of dyspnea: Secondary | ICD-10-CM | POA: Insufficient documentation

## 2011-10-28 DIAGNOSIS — R011 Cardiac murmur, unspecified: Secondary | ICD-10-CM | POA: Insufficient documentation

## 2011-10-28 DIAGNOSIS — I369 Nonrheumatic tricuspid valve disorder, unspecified: Secondary | ICD-10-CM | POA: Insufficient documentation

## 2011-10-28 NOTE — Progress Notes (Signed)
Echocardiogram performed.  

## 2011-12-04 ENCOUNTER — Inpatient Hospital Stay (HOSPITAL_COMMUNITY)
Admission: AD | Admit: 2011-12-04 | Discharge: 2011-12-04 | Disposition: A | Discharge status: Home / Self Care | Payer: BC Managed Care – PPO | Source: Ambulatory Visit | Attending: Obstetrics and Gynecology | Admitting: Obstetrics and Gynecology

## 2011-12-04 ENCOUNTER — Encounter (HOSPITAL_COMMUNITY): Payer: Self-pay | Admitting: *Deleted

## 2011-12-04 DIAGNOSIS — O24319 Unspecified pre-existing diabetes mellitus in pregnancy, unspecified trimester: Secondary | ICD-10-CM

## 2011-12-04 DIAGNOSIS — E119 Type 2 diabetes mellitus without complications: Secondary | ICD-10-CM | POA: Insufficient documentation

## 2011-12-04 DIAGNOSIS — O4700 False labor before 37 completed weeks of gestation, unspecified trimester: Secondary | ICD-10-CM | POA: Insufficient documentation

## 2011-12-04 DIAGNOSIS — O24919 Unspecified diabetes mellitus in pregnancy, unspecified trimester: Secondary | ICD-10-CM | POA: Insufficient documentation

## 2011-12-04 HISTORY — DX: Essential (primary) hypertension: I10

## 2011-12-04 LAB — COMPREHENSIVE METABOLIC PANEL
BUN: 6 mg/dL (ref 6–23)
CO2: 23 mEq/L (ref 19–32)
Calcium: 8.8 mg/dL (ref 8.4–10.5)
Creatinine, Ser: 0.47 mg/dL — ABNORMAL LOW (ref 0.50–1.10)
GFR calc Af Amer: >90 mL/min (ref >90)
GFR calc non Af Amer: >90 mL/min (ref >90)
Glucose, Bld: 172 mg/dL — ABNORMAL HIGH (ref 70–99)

## 2011-12-04 LAB — CBC
Hemoglobin: 11.8 g/dL — ABNORMAL LOW (ref 12.0–15.0)
MCH: 30.3 pg (ref 26.0–34.0)
MCHC: 35.2 g/dL (ref 30.0–36.0)
MCV: 86.1 fL (ref 78.0–100.0)
RBC: 3.89 MIL/uL (ref 3.87–5.11)

## 2011-12-04 LAB — URINE MICROSCOPIC-ADD ON

## 2011-12-04 LAB — URINALYSIS, ROUTINE W REFLEX MICROSCOPIC
Glucose, UA: >1000 mg/dL — AB
Hgb urine dipstick: NEGATIVE
Ketones, ur: 40 mg/dL — AB
Protein, ur: NEGATIVE mg/dL

## 2011-12-04 MED ORDER — LACTATED RINGERS IV BOLUS (SEPSIS)
1000.0000 mL | Freq: Once | INTRAVENOUS | Status: AC
  Administered 2011-12-04: 1000 mL via INTRAVENOUS

## 2011-12-04 MED ORDER — NIFEDIPINE 10 MG PO CAPS
10.0000 mg | ORAL_CAPSULE | ORAL | Status: DC | PRN
  Administered 2011-12-04 (×2): 10 mg via ORAL
  Filled 2011-12-04 (×2): qty 1

## 2011-12-04 NOTE — MAU Note (Signed)
Pt presents to MAU with chief complaint of bilateral side pain and lower back pain times 3 days. Pt is [redacted]w[redacted]d pregnant; G2P0. Says she is having a difficult time sleeping and working due to the pain.

## 2011-12-04 NOTE — MAU Provider Note (Signed)
Chief Complaint:  Back Pain  First Provider Initiated Contact with Patient 12/04/11 1454    HPI: [name] is a 21 y.o. G2P0010 at [redacted]w[redacted]d who presents to maternity admissions reporting bilateral side pain and lower back pain times 3 days. Interferes w/ work and sleep. Unsure if she is contracting. Denies leakage of fluid, urinary complaints, diarrhea, constipation or vaginal bleeding. Good fetal movement. Some nausea this morning and vomited once. Able to eat and drink normally. Type I Diabetic.   Past Medical History: Past Medical History  Diagnosis Date  . Diabetes mellitus   . Asthma   . Hypertension     Past obstetric history: OB History    Grav Para Term Preterm Abortions TAB SAB Ect Mult Living   2 0 0 0 1 1 0 0 0 0      # Outc Date GA Lbr Len/2nd Wgt Sex Del Anes PTL Lv   1 TAB            2 CUR               Past Surgical History: Past Surgical History  Procedure Date  . Wisdom tooth extraction     Family History: History reviewed. No pertinent family history.  Social History: History  Substance Use Topics  . Smoking status: Never Smoker   . Smokeless tobacco: Not on file  . Alcohol Use: No    Allergies:  Allergies  Allergen Reactions  . Peanut-Containing Drug Products Anaphylaxis  . Strawberry Swelling    Meds:  No prescriptions prior to admission    ROS: Pertinent findings in history of present illness.  Physical Exam  Blood pressure 127/88, pulse 110, temperature 98 F (36.7 C), temperature source Oral, resp. rate 18, height 5\' 4"  (1.626 m), weight 64.592 kg (142 lb 6.4 oz).  height is 5\' 4"  (1.626 m) and weight is 64.592 kg (142 lb 6.4 oz). Her oral temperature is 98 F (36.7 C). Her blood pressure is 127/88 and her pulse is 110. Her respiration is 18.   GENERAL: Well-developed, well-nourished female in no acute distress.  HEENT: normocephalic HEART: normal rate RESP: normal effort ABDOMEN: Soft, non-tender, gravid appropriate for  gestational age, No CVAT. Back NT EXTREMITIES: Nontender, no edema NEURO: alert and oriented SPECULUM EXAM: NEFG, physiologic discharge, no blood, cervix clean Dilation: Fingertip Effacement (%): Thick Cervical Position: Posterior Station: Ballotable Presentation: Undeterminable Exam by:: Ivonne Andrew, CNM  FHT:  Baseline 130 , moderate variability, accelerations present, no decelerations Contractions: frequent UI   Labs: Results for orders placed during the hospital encounter of 12/04/11 (from the past 24 hour(s))  URINALYSIS, ROUTINE W REFLEX MICROSCOPIC     Status: Abnormal   Collection Time   12/04/11  1:35 PM      Component Value Range   Color, Urine YELLOW  YELLOW   APPearance HAZY (*) CLEAR   Specific Gravity, Urine 1.015  1.005 - 1.030   pH 6.5  5.0 - 8.0   Glucose, UA >1000 (*) NEGATIVE mg/dL   Hgb urine dipstick NEGATIVE  NEGATIVE   Bilirubin Urine NEGATIVE  NEGATIVE   Ketones, ur 40 (*) NEGATIVE mg/dL   Protein, ur NEGATIVE  NEGATIVE mg/dL   Urobilinogen, UA 0.2  0.0 - 1.0 mg/dL   Nitrite NEGATIVE  NEGATIVE   Leukocytes, UA SMALL (*) NEGATIVE  URINE MICROSCOPIC-ADD ON     Status: Abnormal   Collection Time   12/04/11  1:35 PM      Component Value Range  Squamous Epithelial / LPF MANY (*) RARE   WBC, UA 21-50  <3 WBC/hpf   RBC / HPF 3-6  <3 RBC/hpf   Bacteria, UA MANY (*) RARE   Urine-Other MUCOUS PRESENT    GLUCOSE, CAPILLARY     Status: Abnormal   Collection Time   12/04/11  2:46 PM      Component Value Range   Glucose-Capillary 160 (*) 70 - 99 mg/dL  CBC     Status: Abnormal   Collection Time   12/04/11  2:59 PM      Component Value Range   WBC 7.4  4.0 - 10.5 K/uL   RBC 3.89  3.87 - 5.11 MIL/uL   Hemoglobin 11.8 (*) 12.0 - 15.0 g/dL   HCT 16.1 (*) 09.6 - 04.5 %   MCV 86.1  78.0 - 100.0 fL   MCH 30.3  26.0 - 34.0 pg   MCHC 35.2  30.0 - 36.0 g/dL   RDW 40.9  81.1 - 91.4 %   Platelets 282  150 - 400 K/uL  COMPREHENSIVE METABOLIC PANEL     Status:  Abnormal   Collection Time   12/04/11  2:59 PM      Component Value Range   Sodium 133 (*) 135 - 145 mEq/L   Potassium 3.7  3.5 - 5.1 mEq/L   Chloride 100  96 - 112 mEq/L   CO2 23  19 - 32 mEq/L   Glucose, Bld 172 (*) 70 - 99 mg/dL   BUN 6  6 - 23 mg/dL   Creatinine, Ser 7.82 (*) 0.50 - 1.10 mg/dL   Calcium 8.8  8.4 - 95.6 mg/dL   Total Protein 5.9 (*) 6.0 - 8.3 g/dL   Albumin 2.5 (*) 3.5 - 5.2 g/dL   AST 12  0 - 37 U/L   ALT 8  0 - 35 U/L   Alkaline Phosphatase 124 (*) 39 - 117 U/L   Total Bilirubin 0.2 (*) 0.3 - 1.2 mg/dL   GFR calc non Af Amer >90  >90 mL/min   GFR calc Af Amer >90  >90 mL/min    Imaging:  No results found. MAU Course: UI and pain resolved w/ IV fluids and Procardia 10 mg x 2 doses. Tolerating POs.  Assessment: 1. Preterm uterine contractions, antepartum   2. Preexisting diabetes complicating pregnancy, antepartum     Plan: Discharge home Preterm labor precautions and fetal kick counts Increase fluids. Pelvic rest x 1 week.      Follow-up Information    Follow up with CALLAHAN, SIDNEY, DO. On 12/08/2011.   Contact information:   [address] [place] [address] [phone]       Follow up with [facility] [place] MATERN[facility] ADMISSIONS. (If symptoms worsen as needed)    Contact information:   [address] [id number] mc [place] [address] [phone]          Medication List     As of 12/05/2011 12:07 AM    TAKE these medications         albuterol 108 (90 BASE) MCG/ACT inhaler   Commonly known as: PROVENTIL HFA;VENTOLIN HFA   Inhale 2 puffs into the lungs every 6 (six) hours as needed. For shortness of breath      beclomethasone 40 MCG/ACT inhaler   Commonly known as: QVAR   Inhale 2 puffs into the lungs 2 (two) times daily.      fluticasone 50 MCG/ACT nasal spray  Commonly known as: FLONASE   Place 2 sprays into the nose daily.      insulin detemir 100 UNIT/ML  injection   Commonly known as: LEVEMIR   Inject 32 Units into the skin daily.      insulin detemir 100 UNIT/ML injection   Commonly known as: LEVEMIR   Inject 26 Units into the skin at bedtime.      NOVOLOG FLEXPEN 100 UNIT/ML injection   Generic drug: insulin aspart   Inject into the skin as directed. Pt injects according to sliding scale, 3-10 units min/max      prenatal multivitamin Tabs   Take 1 tablet by mouth daily.          [place], CNM 12/05/2011 4:59 PM

## 2011-12-04 NOTE — MAU Note (Signed)
Pt reports having bad lower back and side pain for the past 3 days.

## 2011-12-05 LAB — URINE CULTURE

## 2011-12-08 ENCOUNTER — Inpatient Hospital Stay (HOSPITAL_COMMUNITY)
Admission: AD | Admit: 2011-12-08 | Discharge: 2011-12-12 | DRG: 379 | Disposition: A | Discharge status: Home / Self Care | Payer: BC Managed Care – PPO | Source: Ambulatory Visit | Attending: Obstetrics and Gynecology | Admitting: Obstetrics and Gynecology

## 2011-12-08 ENCOUNTER — Encounter (HOSPITAL_COMMUNITY): Payer: Self-pay | Admitting: *Deleted

## 2011-12-08 DIAGNOSIS — E109 Type 1 diabetes mellitus without complications: Secondary | ICD-10-CM | POA: Diagnosis present

## 2011-12-08 DIAGNOSIS — O24919 Unspecified diabetes mellitus in pregnancy, unspecified trimester: Secondary | ICD-10-CM | POA: Diagnosis present

## 2011-12-08 DIAGNOSIS — O4700 False labor before 37 completed weeks of gestation, unspecified trimester: Secondary | ICD-10-CM

## 2011-12-08 DIAGNOSIS — O26879 Cervical shortening, unspecified trimester: Secondary | ICD-10-CM | POA: Diagnosis present

## 2011-12-08 DIAGNOSIS — O368390 Maternal care for abnormalities of the fetal heart rate or rhythm, unspecified trimester, not applicable or unspecified: Secondary | ICD-10-CM | POA: Diagnosis present

## 2011-12-08 LAB — URINALYSIS, ROUTINE W REFLEX MICROSCOPIC
Bilirubin Urine: NEGATIVE
Nitrite: NEGATIVE
Specific Gravity, Urine: 1.01 (ref 1.005–1.030)
Urobilinogen, UA: 0.2 mg/dL (ref 0.0–1.0)

## 2011-12-08 LAB — URINE MICROSCOPIC-ADD ON

## 2011-12-08 LAB — GLUCOSE, CAPILLARY
Glucose-Capillary: 190 mg/dL — ABNORMAL HIGH (ref 70–99)
Glucose-Capillary: 307 mg/dL — ABNORMAL HIGH (ref 70–99)

## 2011-12-08 LAB — CBC
Hemoglobin: 11.1 g/dL — ABNORMAL LOW (ref 12.0–15.0)
MCHC: 33.8 g/dL (ref 30.0–36.0)
RDW: 13.4 % (ref 11.5–15.5)

## 2011-12-08 MED ORDER — DOCUSATE SODIUM 100 MG PO CAPS
100.0000 mg | ORAL_CAPSULE | Freq: Every day | ORAL | Status: DC
  Administered 2011-12-10 – 2011-12-11 (×2): 100 mg via ORAL
  Filled 2011-12-08 (×2): qty 1

## 2011-12-08 MED ORDER — LACTATED RINGERS IV SOLN
INTRAVENOUS | Status: DC
  Administered 2011-12-08 – 2011-12-11 (×6): via INTRAVENOUS

## 2011-12-08 MED ORDER — INSULIN DETEMIR 100 UNIT/ML ~~LOC~~ SOLN: 26.0000 [IU] | Freq: Every day | SUBCUTANEOUS | Status: DC

## 2011-12-08 MED ORDER — MAGNESIUM SULFATE 40 MG/ML IJ SOLN
4.0000 g | Freq: Once | INTRAMUSCULAR | Status: AC
  Administered 2011-12-08: 4 g via INTRAVENOUS

## 2011-12-08 MED ORDER — IBUPROFEN 600 MG PO TABS
600.0000 mg | ORAL_TABLET | Freq: Once | ORAL | Status: AC
  Administered 2011-12-08: 600 mg via ORAL
  Filled 2011-12-08: qty 1

## 2011-12-08 MED ORDER — CALCIUM CARBONATE ANTACID 500 MG PO CHEW: 2.0000 | CHEWABLE_TABLET | ORAL | Status: DC | PRN

## 2011-12-08 MED ORDER — DOCUSATE SODIUM 100 MG PO CAPS: 100.0000 mg | ORAL_CAPSULE | Freq: Every day | ORAL | Status: DC

## 2011-12-08 MED ORDER — INSULIN ASPART 100 UNIT/ML ~~LOC~~ SOLN: 10.0000 [IU] | Freq: Three times a day (TID) | SUBCUTANEOUS | Status: DC

## 2011-12-08 MED ORDER — ZOLPIDEM TARTRATE 5 MG PO TABS: 5.0000 mg | ORAL_TABLET | Freq: Every evening | ORAL | Status: DC | PRN

## 2011-12-08 MED ORDER — INSULIN ASPART 100 UNIT/ML ~~LOC~~ SOLN: 5.0000 [IU] | Freq: Once | SUBCUTANEOUS | Status: DC

## 2011-12-08 MED ORDER — LACTATED RINGERS IV BOLUS (SEPSIS)
500.0000 mL | Freq: Once | INTRAVENOUS | Status: AC
  Administered 2011-12-08: 1000 mL via INTRAVENOUS

## 2011-12-08 MED ORDER — ALBUTEROL SULFATE HFA 108 (90 BASE) MCG/ACT IN AERS
2.0000 | INHALATION_SPRAY | Freq: Four times a day (QID) | RESPIRATORY_TRACT | Status: DC | PRN
  Filled 2011-12-08: qty 6.7

## 2011-12-08 MED ORDER — INSULIN DETEMIR 100 UNIT/ML ~~LOC~~ SOLN: 32.0000 [IU] | Freq: Every day | SUBCUTANEOUS | Status: DC

## 2011-12-08 MED ORDER — FLUTICASONE PROPIONATE 50 MCG/ACT NA SUSP
2.0000 | Freq: Every day | NASAL | Status: DC
  Filled 2011-12-08: qty 16

## 2011-12-08 MED ORDER — BETAMETHASONE SOD PHOS & ACET 6 (3-3) MG/ML IJ SUSP
12.0000 mg | Freq: Once | INTRAMUSCULAR | Status: AC
  Administered 2011-12-08: 12 mg via INTRAMUSCULAR
  Filled 2011-12-08: qty 2

## 2011-12-08 MED ORDER — INSULIN DETEMIR 100 UNIT/ML ~~LOC~~ SOLN
26.0000 [IU] | Freq: Every day | SUBCUTANEOUS | Status: DC
  Administered 2011-12-08 – 2011-12-11 (×4): 26 [IU] via SUBCUTANEOUS

## 2011-12-08 MED ORDER — INSULIN ASPART 100 UNIT/ML ~~LOC~~ SOLN
1.0000 [IU] | Freq: Three times a day (TID) | SUBCUTANEOUS | Status: DC
  Administered 2011-12-08: 8 [IU] via SUBCUTANEOUS
  Administered 2011-12-09: 7 [IU] via SUBCUTANEOUS
  Administered 2011-12-09: 4 [IU] via SUBCUTANEOUS
  Administered 2011-12-09: 8 [IU] via SUBCUTANEOUS
  Administered 2011-12-10: 7 [IU] via SUBCUTANEOUS
  Administered 2011-12-10: 5 [IU] via SUBCUTANEOUS
  Administered 2011-12-10 – 2011-12-11 (×2): 4 [IU] via SUBCUTANEOUS

## 2011-12-08 MED ORDER — CALCIUM CARBONATE ANTACID 500 MG PO CHEW
2.0000 | CHEWABLE_TABLET | ORAL | Status: DC | PRN
  Administered 2011-12-10: 400 mg via ORAL
  Filled 2011-12-08: qty 2

## 2011-12-08 MED ORDER — ACETAMINOPHEN 325 MG PO TABS: 650.0000 mg | ORAL_TABLET | ORAL | Status: DC | PRN

## 2011-12-08 MED ORDER — INSULIN ASPART 100 UNIT/ML ~~LOC~~ SOLN
10.0000 [IU] | Freq: Once | SUBCUTANEOUS | Status: AC
  Administered 2011-12-08: 10 [IU] via SUBCUTANEOUS

## 2011-12-08 MED ORDER — PRENATAL MULTIVITAMIN CH: 1.0000 | ORAL_TABLET | Freq: Every day | ORAL | Status: DC

## 2011-12-08 MED ORDER — MAGNESIUM SULFATE 40 G IN LACTATED RINGERS - SIMPLE
2.0000 g/h | INTRAVENOUS | Status: DC
  Administered 2011-12-09 – 2011-12-10 (×2): 2 g/h via INTRAVENOUS
  Filled 2011-12-08 (×3): qty 500

## 2011-12-08 MED ORDER — INSULIN DETEMIR 100 UNIT/ML ~~LOC~~ SOLN
32.0000 [IU] | Freq: Every day | SUBCUTANEOUS | Status: DC
  Administered 2011-12-09 – 2011-12-12 (×4): 32 [IU] via SUBCUTANEOUS
  Filled 2011-12-08: qty 10

## 2011-12-08 MED ORDER — BETAMETHASONE SOD PHOS & ACET 6 (3-3) MG/ML IJ SUSP
12.0000 mg | Freq: Once | INTRAMUSCULAR | Status: AC
  Administered 2011-12-09: 12 mg via INTRAMUSCULAR
  Filled 2011-12-08: qty 2

## 2011-12-08 MED ORDER — NIFEDIPINE 10 MG PO CAPS
10.0000 mg | ORAL_CAPSULE | Freq: Once | ORAL | Status: AC
  Administered 2011-12-08: 10 mg via ORAL
  Filled 2011-12-08: qty 1

## 2011-12-08 MED ORDER — INSULIN ASPART 100 UNIT/ML ~~LOC~~ SOLN
3.0000 [IU] | SUBCUTANEOUS | Status: DC
  Administered 2011-12-08: 10 [IU] via SUBCUTANEOUS
  Administered 2011-12-09: 8 [IU] via SUBCUTANEOUS
  Administered 2011-12-09: 3 [IU] via SUBCUTANEOUS
  Administered 2011-12-09: 7 [IU] via SUBCUTANEOUS
  Administered 2011-12-09: 6 [IU] via SUBCUTANEOUS
  Administered 2011-12-09: 7 [IU] via SUBCUTANEOUS
  Administered 2011-12-10: 6 [IU] via SUBCUTANEOUS
  Administered 2011-12-10: 8 [IU] via SUBCUTANEOUS
  Administered 2011-12-10: 5 [IU] via SUBCUTANEOUS
  Administered 2011-12-10 (×2): 9 [IU] via SUBCUTANEOUS
  Administered 2011-12-11: 5 [IU] via SUBCUTANEOUS
  Administered 2011-12-11: 3 [IU] via SUBCUTANEOUS
  Administered 2011-12-11: 6 [IU] via SUBCUTANEOUS
  Administered 2011-12-11: 9 [IU] via SUBCUTANEOUS
  Administered 2011-12-11: 5 [IU] via SUBCUTANEOUS
  Administered 2011-12-11: 4 [IU] via SUBCUTANEOUS

## 2011-12-08 MED ORDER — BECLOMETHASONE DIPROPIONATE 40 MCG/ACT IN AERS
2.0000 | INHALATION_SPRAY | Freq: Two times a day (BID) | RESPIRATORY_TRACT | Status: DC
Start: 2011-12-09 — End: 2011-12-12
  Administered 2011-12-09 – 2011-12-10 (×2): 2 via RESPIRATORY_TRACT
  Filled 2011-12-08: qty 8.7

## 2011-12-08 MED ORDER — PRENATAL MULTIVITAMIN CH
1.0000 | ORAL_TABLET | Freq: Every day | ORAL | Status: DC
  Administered 2011-12-10 – 2011-12-11 (×2): 1 via ORAL
  Filled 2011-12-08 (×2): qty 1

## 2011-12-08 NOTE — MAU Note (Signed)
Patient was given pitcher of water encourage to drink as she has had very little to drink today.

## 2011-12-08 NOTE — MAU Note (Signed)
Notified Dr. Dareen Piano of CBC results, contractions still every 2-3 minutes.

## 2011-12-08 NOTE — Progress Notes (Signed)
Patient with UC's on monitor but she states that she is not feeling them.  Exam:  Vertex in lower segment.  Cervix is posterior, soft.  External os is open.  I could not easily reach the internal os to confirm effacement or dilatation.  I will give steroid protocol to minimize neonatal risks of prematurity if labor progresses.

## 2011-12-08 NOTE — MAU Note (Signed)
Patient states she is having contractions that are frequent. Denies leaking or bleeding and reports good fetal movement. Patient states she was in the office and was having contractions on the monitor and sent to MAU for evaluation.

## 2011-12-08 NOTE — MAU Provider Note (Signed)
History     CSN: [id number]  Arrival date and time: 12/08/11 1100   First Provider Initiated Contact with Patient 12/08/11 1149      Chief Complaint  Patient presents with  . Labor Eval   HPI [name] is 21 y.o. G2P0010 [redacted]w[redacted]d weeks presenting with contractions.  She was seen today in the office by Dr. Dareen Piano.  Sent her to monitor contractions.  She was here 4 days ago with same sxs.  Was given 2 procardia tabs and IV hydration.  No sexual activity since  During pregnancy.  Denies vaginal bleeding, loss of fluid, abnormal discharge, and decreased fetal movement.  Patient does feel the contractions "in my sides and my lower back".  Hx of Diabetic type 1.  Missed one of her insulin doses today.      Past Medical History  Diagnosis Date  . Diabetes mellitus   . Asthma   . Hypertension     Past Surgical History  Procedure Date  . Wisdom tooth extraction     No family history on file.  History  Substance Use Topics  . Smoking status: Never Smoker   . Smokeless tobacco: Not on file  . Alcohol Use: No    Allergies:  Allergies  Allergen Reactions  . Peanut-Containing Drug Products Anaphylaxis  . Strawberry Swelling    Prescriptions prior to admission  Medication Sig Dispense Refill  . acetaminophen (TYLENOL) 325 MG tablet Take 650 mg by mouth every 6 (six) hours as needed. For pain      . albuterol (PROVENTIL HFA;VENTOLIN HFA) 108 (90 BASE) MCG/ACT inhaler Inhale 2 puffs into the lungs every 6 (six) hours as needed. For shortness of breath      . beclomethasone (QVAR) 40 MCG/ACT inhaler Inhale 2 puffs into the lungs 2 (two) times daily.      . fluticasone (FLONASE) 50 MCG/ACT nasal spray Place 2 sprays into the nose daily.      . insulin aspart (NOVOLOG FLEXPEN) 100 UNIT/ML injection Inject into the skin as directed. Pt injects according to sliding scale, 3-10 units min/max      . insulin detemir (LEVEMIR) 100 UNIT/ML injection Inject 32 Units into the skin  daily.      . insulin detemir (LEVEMIR) 100 UNIT/ML injection Inject 26 Units into the skin at bedtime.      . Prenatal Vit-Fe Fumarate-FA (PRENATAL MULTIVITAMIN) TABS Take 1 tablet by mouth daily.        ROS Physical Exam   Blood pressure 129/84, pulse 107, temperature 98.2 F (36.8 C), temperature source Oral, resp. rate 16, height 5' 3.25" (1.607 m), weight 144 lb 3.2 oz (65.409 kg), SpO2 98.00%.  Physical Exam  Constitutional: She is oriented to person, place, and time. She appears well-developed and well-nourished. No distress.  Neck: Normal range of motion.  Cardiovascular: Normal rate.   Respiratory: Effort normal.  Genitourinary:       Cervical exam by Elnita Maxwell, RN   1cm 40%  Neurological: She is alert and oriented to person, place, and time.   Results for orders placed during the hospital encounter of 12/08/11 (from the past 24 hour(s))  URINALYSIS, ROUTINE W REFLEX MICROSCOPIC     Status: Abnormal   Collection Time   12/08/11 11:15 AM      Component Value Range   Color, Urine YELLOW  YELLOW   APPearance CLEAR  CLEAR   Specific Gravity, Urine 1.010  1.005 - 1.030   pH 6.5  5.0 - 8.0  Glucose, UA >1000 (*) NEGATIVE mg/dL   Hgb urine dipstick NEGATIVE  NEGATIVE   Bilirubin Urine NEGATIVE  NEGATIVE   Ketones, ur 15 (*) NEGATIVE mg/dL   Protein, ur NEGATIVE  NEGATIVE mg/dL   Urobilinogen, UA 0.2  0.0 - 1.0 mg/dL   Nitrite NEGATIVE  NEGATIVE   Leukocytes, UA TRACE (*) NEGATIVE  URINE MICROSCOPIC-ADD ON     Status: Abnormal   Collection Time   12/08/11 11:15 AM      Component Value Range   Squamous Epithelial / LPF FEW (*) RARE   WBC, UA 3-6  <3 WBC/hpf   RBC / HPF 0-2  <3 RBC/hpf   Bacteria, UA FEW (*) RARE  CBC     Status: Abnormal   Collection Time   12/08/11 12:52 PM      Component Value Range   WBC 7.3  4.0 - 10.5 K/uL   RBC 3.81 (*) 3.87 - 5.11 MIL/uL   Hemoglobin 11.1 (*) 12.0 - 15.0 g/dL   HCT 16.1 (*) 09.6 - 04.5 %   MCV 86.1  78.0 - 100.0 fL   MCH  29.1  26.0 - 34.0 pg   MCHC 33.8  30.0 - 36.0 g/dL   RDW 40.9  81.1 - 91.4 %   Platelets 253  150 - 400 K/uL   MAU Course  Procedures  MDM   FMS contracting q2-3 minutes.  FMS REACTIVE with baseline of 130.  MSE reported to DR. Anderson.  Order to check cervix, start IV LR and given Procardia 10mg  po now.   12:25 He is in to see patient.  Order given for a moderate carb diet and give NovoLog 5 units before meal.   Per Cheryl's report, Dr. Dareen Piano is admitting patient for Mag.    Assessment and Plan  A:  Preterm contractions at [redacted]w[redacted]d    Type 1 diabetic\     P: Dr Dareen Piano is admitting patient.  Donis Pinder,EVE M 12/08/2011, 11:56 AM

## 2011-12-09 ENCOUNTER — Encounter (HOSPITAL_COMMUNITY): Payer: Self-pay | Admitting: *Deleted

## 2011-12-09 LAB — GLUCOSE, CAPILLARY
Glucose-Capillary: 207 mg/dL — ABNORMAL HIGH (ref 70–99)
Glucose-Capillary: 147 mg/dL — ABNORMAL HIGH (ref 70–99)

## 2011-12-09 MED ORDER — OXYCODONE-ACETAMINOPHEN 5-325 MG PO TABS: 1.0000 | ORAL_TABLET | ORAL | Status: DC | PRN

## 2011-12-09 MED ORDER — ONDANSETRON HCL 4 MG/2ML IJ SOLN
4.0000 mg | Freq: Once | INTRAMUSCULAR | Status: AC
  Administered 2011-12-09: 4 mg via INTRAVENOUS
  Filled 2011-12-09: qty 2

## 2011-12-09 NOTE — H&P (Signed)
NAMEMarland Kitchen  Stefanie, Braun.:  [id number]  MEDICAL RECORD NO.:  [id number]  LOCATION:  9151                          FACILITY:  [facility]  PHYSICIAN:  Malva Limes, M.D.    DATE OF BIRTH:  04-May-1990  DATE OF ADMISSION:  12/08/2011 DATE OF DISCHARGE:                             HISTORY & PHYSICAL   She was admitted today to [facility] on December 08, 2011.  HISTORY OF PRESENT ILLNESS:  [name] is a 21 year old, black female, G2, P0-0-1-0, [facility] February 05, 2012 at 37 and 4/7th weeks estimated gestational age for preterm labor.  The patient's pregnancy has been complicated by type 1 diabetes mellitus Class B.  She has been followed by Dr. Lucianne Muss, her endocrinologist.  Initially, her blood sugars were not well controlled, however, recently she was attaining significantly better fasting and postprandial glucose levels.  The patient had been seen in the emergency room 4 days ago, complaining of similar complaints.  At that time, she was having contractions every 2-4 minutes.  She was treated with IV fluids and p.o. Procardia.  Her contractions resolved and she was discharged.  At that time, it was reported that her cervix was closed.  Today, she was seen for routine office visit and was noted to have contractions every 2-3 minutes.  She was sent to [facility], where again she was given p.o. Procardia and IV fluids.  At this time, her cervix was noted to be 1 cm dilated and soft.  Despite these measures, her contractions did not abate and therefore, she was admitted for IV Procardia.  The patient was admitted to the antenatal.  The patient did have a ultrasound on November 24, 2011, with estimated fetal weight at the 43rd percentile.  PAST MEDICAL HISTORY:  The patient has type 1 diabetes, as noted above. She also has a history of asthma, but has had no exacerbation recently. The patient had late prenatal care.  Her 1st visit was at 16 weeks.  FAMILY HISTORY:   She has a family history of hypertension and diabetes.  PHYSICAL EXAMINATION:  GENERAL:  The patient is a thin black female, in no apparent distress. HEENT:  Within normal limits. LUNGS:  Clear to auscultation. ABDOMEN:  Gravid.  There is palpable contractions and fetal movement. EXTREMITIES:  Within normal limits. PELVIC:  Her fetal heart tones were reactive without D cells.  She was noted to have contractions every 2-3 minutes.  Per nursing exam, her cervix was 20% effaced, 1 cm dilated and soft.  It was felt that the infant was in the vertex presentation.  IMPRESSION: 1. Intrauterine pregnancy at 71 and half weeks estimated gestational     age. 2. Preterm labor. 3. Type 1 Class B diabetes.  PLAN:  Admit for tocolysis.          ______________________________ Malva Limes, M.D.     MA/MEDQ  D:  12/08/2011  T:  12/09/2011  Job:  [id number]

## 2011-12-09 NOTE — Progress Notes (Signed)
TC from Dr. Arlyce Dice viewing strip from remote location.  Informed that pt continues to have regular UC's and aware of only uc's of longer duration.  No orders.

## 2011-12-09 NOTE — Progress Notes (Signed)
TC to Dr. Arlyce Dice to report elevated CBG after BMZ.  Nurse to recheck CBG in 2 hrs.  Pt to have after meal coverage of Novalog..  See orders.

## 2011-12-09 NOTE — Progress Notes (Signed)
Late entry  Pt comfortable.  Denies ctx, but then states has some mild ctx q52min.  Denies LOF or VB.  Denies any asthma exacerbation.  Good FM.  No other complaints.  Filed Vitals:   12/09/11 1205 12/09/11 1305 12/09/11 1354 12/09/11 1500  BP: 132/81     Pulse: 111   112  Temp: 97.6 F (36.4 C)     TempSrc: Oral     Resp: 18 18 24 24   Height:      Weight:      SpO2:    100%    Abd: soft, NT Ext: SCDs in place, no CT  Lab Results  Component Value Date   WBC 7.3 12/08/2011   HGB 11.1* 12/08/2011   HCT 32.8* 12/08/2011   MCV 86.1 12/08/2011   PLT 253 12/08/2011      A/P Preterm contractions S/p beta x 1, 2nd dose tonight approx 8pm S/p procardia 10mg  x 1 dose Mag sulfate ordered by Dr. Dareen Piano for tocolysis.  Now has had approx 24hrs.  Will d/c tomorrow. Continue current insulin regimen.  Increased BSs noted due to steroid administration.  If labor progresses will switch to insulin drip for tighter sugar control at delivery. Continuous FHT and toco  Routine care.   GBS not yet collected- will order.  Philip Aspen

## 2011-12-09 NOTE — Plan of Care (Signed)
Problem: Consults Goal: Birthing Suites Patient Information Press F2 to bring up selections list   Pt < [redacted] weeks EGA     

## 2011-12-10 ENCOUNTER — Inpatient Hospital Stay (HOSPITAL_COMMUNITY): Payer: BC Managed Care – PPO

## 2011-12-10 LAB — FETAL FIBRONECTIN: Fetal Fibronectin: NEGATIVE

## 2011-12-10 LAB — COMPREHENSIVE METABOLIC PANEL
ALT: 7 U/L (ref 0–35)
Alkaline Phosphatase: 131 U/L — ABNORMAL HIGH (ref 39–117)
CO2: 9 mEq/L — CL (ref 19–32)
Chloride: 103 mEq/L (ref 96–112)
GFR calc Af Amer: >90 mL/min (ref >90)
GFR calc non Af Amer: >90 mL/min (ref >90)
Glucose, Bld: 222 mg/dL — ABNORMAL HIGH (ref 70–99)
Potassium: 4.3 mEq/L (ref 3.5–5.1)
Sodium: 132 mEq/L — ABNORMAL LOW (ref 135–145)
Total Protein: 6.2 g/dL (ref 6.0–8.3)

## 2011-12-10 LAB — GLUCOSE, CAPILLARY
Glucose-Capillary: 225 mg/dL — ABNORMAL HIGH (ref 70–99)
Glucose-Capillary: 242 mg/dL — ABNORMAL HIGH (ref 70–99)

## 2011-12-10 LAB — URINE MICROSCOPIC-ADD ON

## 2011-12-10 LAB — URINALYSIS, ROUTINE W REFLEX MICROSCOPIC
Bilirubin Urine: NEGATIVE
Leukocytes, UA: NEGATIVE
Nitrite: NEGATIVE
Specific Gravity, Urine: >1.03 — ABNORMAL HIGH (ref 1.005–1.030)
pH: 5.5 (ref 5.0–8.0)

## 2011-12-10 LAB — URINE CULTURE

## 2011-12-10 MED ORDER — LACTATED RINGERS IV BOLUS (SEPSIS)
500.0000 mL | Freq: Once | INTRAVENOUS | Status: AC
  Administered 2011-12-10: 500 mL via INTRAVENOUS

## 2011-12-10 NOTE — Progress Notes (Signed)
[name], female   DOB: [birth year], 21 y.o.   MRN: [id number]   S: Pt feeling contractions despite 2 grams of magnesium sulfate per hr.  States pain 7/10 with contractions, however her appearance during contractions does not support this level of discomfort.  Denies bleeding or leaking of fluid.  She only complains of pressure. O:  Filed Vitals:   12/10/11 1102 12/10/11 1205 12/10/11 1215 12/10/11 1259  BP:  161/76 135/68   Pulse:  107 95   Temp:  97.8 F (36.6 C)    TempSrc:  Oral    Resp: 24 18 18 24   Height:      Weight:      SpO2:       AOX3, NAD Gravid, soft, NT FHT 140-150 + accels, no decels. Reactive cvx 1-2/50/-2 toco q2-4  FFN: negative 12/10/11  A/P: 21 yo G1P0 @ 32+1 type 1 DM admitted with Preterm contractions.   1) Contractions: Pt contracting through 2 grams of Magnesium sulfate, however no significant cervical change since sdmission.  FFN not done on admission but done today and negative.  Will stop magnesium sulfate. 2) Glucoreg:  Pt given BMZ for enhancement of lung maturity on admission.  2nd dose last night at 8pm. Blood sugars now elevated due to steroid effect.  Pt now also carb counting and covering herself with novolog insulin. 3) Will obtain an ultrasound for growth, AFI and TVUS

## 2011-12-10 NOTE — Progress Notes (Signed)
Ur done 

## 2011-12-11 LAB — GLUCOSE, CAPILLARY
Glucose-Capillary: 248 mg/dL — ABNORMAL HIGH (ref 70–99)
Glucose-Capillary: 170 mg/dL — ABNORMAL HIGH (ref 70–99)
Glucose-Capillary: 117 mg/dL — ABNORMAL HIGH (ref 70–99)
Glucose-Capillary: 197 mg/dL — ABNORMAL HIGH (ref 70–99)
Glucose-Capillary: 190 mg/dL — ABNORMAL HIGH (ref 70–99)

## 2011-12-11 MED ORDER — NIFEDIPINE 10 MG PO CAPS
10.0000 mg | ORAL_CAPSULE | Freq: Four times a day (QID) | ORAL | Status: DC
  Administered 2011-12-11 – 2011-12-12 (×2): 10 mg via ORAL
  Filled 2011-12-11 (×2): qty 1

## 2011-12-11 MED ORDER — CITRIC ACID-SODIUM CITRATE 334-500 MG/5ML PO SOLN
30.0000 mL | Freq: Once | ORAL | Status: AC
  Administered 2011-12-11: 30 mL via ORAL
  Filled 2011-12-11: qty 15

## 2011-12-11 MED ORDER — FAMOTIDINE 20 MG PO TABS
20.0000 mg | ORAL_TABLET | Freq: Two times a day (BID) | ORAL | Status: DC
  Administered 2011-12-11 – 2011-12-12 (×3): 20 mg via ORAL
  Filled 2011-12-11 (×3): qty 1

## 2011-12-11 NOTE — Progress Notes (Signed)
21 y.o. G2P0010 [redacted]w[redacted]d HD#3 admitted for CONTRACTIONS.  Pt currently stable with c/o ctxes.  Good FM.  Filed Vitals:   12/11/11 0035  BP: 128/60  Pulse: 101  Temp: 98.4 F (36.9 C)  Resp: 18  ;  Lungs CTA Cor RRR Abd  Soft, gravid, nontender Ex SCDs FHTs  120-130ss, good short term variability, NST R; now moderate variables with ctxes Toco  irreg  Results for orders placed during the hospital encounter of 12/08/11 (from the past 24 hour(s))  COMPREHENSIVE METABOLIC PANEL     Status: Abnormal   Collection Time   12/10/11  9:08 AM      Component Value Range   Sodium 132 (*) 135 - 145 mEq/L   Potassium 4.3  3.5 - 5.1 mEq/L   Chloride 103  96 - 112 mEq/L   CO2 9 (*) 19 - 32 mEq/L   Glucose, Bld 222 (*) 70 - 99 mg/dL   BUN 8  6 - 23 mg/dL   Creatinine, Ser 5.40  0.50 - 1.10 mg/dL   Calcium 8.2 (*) 8.4 - 10.5 mg/dL   Total Protein 6.2  6.0 - 8.3 g/dL   Albumin 2.5 (*) 3.5 - 5.2 g/dL   AST 11  0 - 37 U/L   ALT 7  0 - 35 U/L   Alkaline Phosphatase 131 (*) 39 - 117 U/L   Total Bilirubin 0.2 (*) 0.3 - 1.2 mg/dL   GFR calc non Af Amer >90  >90 mL/min   GFR calc Af Amer >90  >90 mL/min  GLUCOSE, CAPILLARY     Status: Abnormal   Collection Time   12/10/11 10:54 AM      Component Value Range   Glucose-Capillary 196 (*) 70 - 99 mg/dL   Comment 1 Documented in Chart     Comment 2 Notify RN    FETAL FIBRONECTIN     Status: Normal   Collection Time   12/10/11 12:05 PM      Component Value Range   Fetal Fibronectin NEGATIVE  NEGATIVE  GLUCOSE, CAPILLARY     Status: Abnormal   Collection Time   12/10/11  1:41 PM      Component Value Range   Glucose-Capillary 204 (*) 70 - 99 mg/dL   Comment 1 Documented in Chart     Comment 2 Notify RN    URINALYSIS, ROUTINE W REFLEX MICROSCOPIC     Status: Abnormal   Collection Time   12/10/11  2:30 PM      Component Value Range   Color, Urine YELLOW  YELLOW   APPearance CLEAR  CLEAR   Specific Gravity, Urine >1.030 (*) 1.005 - 1.030   pH 5.5  5.0 - 8.0   Glucose, UA 250 (*) NEGATIVE mg/dL   Hgb urine dipstick SMALL (*) NEGATIVE   Bilirubin Urine NEGATIVE  NEGATIVE   Ketones, ur >80 (*) NEGATIVE mg/dL   Protein, ur NEGATIVE  NEGATIVE mg/dL   Urobilinogen, UA 0.2  0.0 - 1.0 mg/dL   Nitrite NEGATIVE  NEGATIVE   Leukocytes, UA NEGATIVE  NEGATIVE  URINE MICROSCOPIC-ADD ON     Status: Abnormal   Collection Time   12/10/11  2:30 PM      Component Value Range   Squamous Epithelial / LPF RARE  RARE   WBC, UA 0-2  <3 WBC/hpf   RBC / HPF 0-2  <3 RBC/hpf   Bacteria, UA FEW (*) RARE  GLUCOSE, CAPILLARY     Status: Abnormal  Collection Time   12/10/11  4:45 PM      Component Value Range   Glucose-Capillary 192 (*) 70 - 99 mg/dL   Comment 1 Notify RN    GLUCOSE, CAPILLARY     Status: Abnormal   Collection Time   12/10/11  6:59 PM      Component Value Range   Glucose-Capillary 225 (*) 70 - 99 mg/dL   Comment 1 Documented in Chart     Comment 2 Notify RN    GLUCOSE, CAPILLARY     Status: Abnormal   Collection Time   12/10/11  9:12 PM      Component Value Range   Glucose-Capillary 242 (*) 70 - 99 mg/dL   Comment 1 Documented in Chart     Comment 2 Notify RN    GLUCOSE, CAPILLARY     Status: Abnormal   Collection Time   12/10/11 11:16 PM      Component Value Range   Glucose-Capillary 256 (*) 70 - 99 mg/dL  GLUCOSE, CAPILLARY     Status: Abnormal   Collection Time   12/11/11  2:55 AM      Component Value Range   Glucose-Capillary 248 (*) 70 - 99 mg/dL  GLUCOSE, CAPILLARY     Status: Abnormal   Collection Time   12/11/11  6:39 AM      Component Value Range   Glucose-Capillary 170 (*) 70 - 99 mg/dL   U/S VTX, EFW 1610 - 96%EAV.  AFI 12.9; cervix 2.1 with dynamic change.  A:  HD#3  [redacted]w[redacted]d with Type 1 DM, shortened cervix and preterm ctxes.  P: 1.  S/p 2 doses BMZ- likely cause of sugars being elevated. 2.  Type 1 DM- continue current care- pt adjusting insulin herself. 3.  Moderate variables- will observe for  now.  Myla Mauriello A

## 2011-12-11 NOTE — Progress Notes (Addendum)
Pt having occ ctxes now.  FHTs 130s gstv, NST r. Toco q10-20.  Will give procardia.

## 2011-12-11 NOTE — Progress Notes (Signed)
I visited with pt while making rounds on the unit.  [name] was in good spirits and has a good support system.  She is taking on-line courses and has been able to do some work while in the hospital.  Although this hospitalization was unexpected, she is dealing well with it and has a very focused and positive attitude.  Please page as needs arise, [phone].  [name] 11:35 AM   12/11/11 1100  Clinical Encounter Type  Visited With Family  Visit Type Spiritual support

## 2011-12-11 NOTE — Progress Notes (Signed)
32 2/[redacted] weeks gestation, with PTL.  Height  64" Weight 149 Lbs pre-pregnancy weight 125 Lbs.Pre-pregnancy  BMI 21.5  IBW 120 Lbs  Total weight gain 24 Lbs. Weight gain goals 25-35 Lbs.   Estimated needs: 1650-1850 kcal/day, 60-70 grams protein/day, 1.9 liters fluid/day Carbohydrate modified gestational  diet tolerated well, appetite good. Followed by Dr. Lucianne Muss. For endocrinology . Adjusts her own insulin doses Current diet prescription will provide for increased needs. Nutrition related labs: elevated s/p betamethasone CBG (last 3)   Basename 12/11/11 1146 12/11/11 0639 12/11/11 0255  GLUCAP 117* 170* 248*      Nutrition Dx: Increased nutrient needs r/t pregnancy and fetal growth requirements aeb [redacted] weeks gestation.  No educational needs assessed at this time.  Elisabeth Cara M.Odis Luster LDN Neonatal Nutrition Support Specialist Pager [phone]

## 2011-12-12 LAB — GLUCOSE, CAPILLARY
Glucose-Capillary: 55 mg/dL — ABNORMAL LOW (ref 70–99)
Glucose-Capillary: 58 mg/dL — ABNORMAL LOW (ref 70–99)
Glucose-Capillary: 78 mg/dL (ref 70–99)

## 2011-12-12 MED ORDER — NIFEDIPINE 10 MG PO CAPS: 10.0000 mg | ORAL_CAPSULE | Freq: Four times a day (QID) | ORAL | Status: DC

## 2011-12-12 NOTE — Discharge Summary (Signed)
Physician Discharge Summary  [name] MRN: [id number] DOB/AGE: 10-Oct-1990 21 y.o.  Admit date: 12/08/2011 Discharge date: 12/12/2011  Admission Diagnoses:Preterm contractions, Type 1 DM  Discharge Diagnoses: same Active Problems:  * No active hospital problems. *    Discharged Condition: good  Hospital Course: Pt admitted for preterm contractions and treated with magnesium sulfate for 24 hours.  FFN at that time was negative and magnesium was stopped.  Pt received 2 doses of betamethasone which made her glucose control difficult.  On HD #4, pt's FHTs were reassuring, contractions were gone and glucose control was significantly better.  She was then discharged to home.  Consults: None  Significant Diagnostic Studies: see U/S results; cervix was 2.1 cm.  Treatments: betamethasone and magnesium sulfate.  Discharge Exam: Blood pressure 104/54, pulse 78, temperature 98.6 F (37 C), temperature source Oral, resp. rate 20, height 5\' 4"  (1.626 m), weight 67.677 kg (149 lb 3.2 oz), SpO2 100.00%.   Disposition: 01-Home or Self Care  Discharge Orders    Future Orders Please Complete By Expires   Discharge instructions      Comments:   Modified bedrest.  Refrain from intercourse.  Count baby's movements in 1 hour per day- if you don't get 6 in that hour, call.   LABOR:  When conractions begin, you should start to time them from the beginning of one contraction to the beginning  of the next.  When contractions are 5 - 10 minutes apart or less and have been regular for at least an hour, you should call your health care provider.      Notify physician for bleeding from the vagina      Notify physician for pain or burning when urinating      Notify physician for chills or fever      Notify physician for increase in vaginal discharge      Notify physician for pelvic pressure (sudden increase)      Notify physician if baby moving less than usual      Notify physician for  sudden, constant, or occasional abdominal pain      Notify physician for sudden gushing of fluid from the vagina (with or without continued leaking)      Notify physician for leaking of fluid      Notify physician for fainting spells, "black outs" or loss of consciousness      Notify physician for severe or continued nausea or vomiting      Notify physician for blurring of vision or spots before the eyes      Fetal Kick Count:  Lie on our left side for one hour after a meal, and count the number of times your baby kicks.  If it is less than 5 times, get up, move around and drink some juice.  Repeat the test 30 minutes later.  If it is still less than 5 kicks in an hour, notify your doctor.      Discharge diet:  No restrictions      Do not have sex or do anything that might make you have an orgasm      Discharge activity:  No Restrictions          Medication List     As of 12/12/2011  9:54 AM    TAKE these medications         acetaminophen 325 MG tablet   Commonly known as: TYLENOL   Take 650 mg by mouth every 6 (six) hours as  needed. For pain      albuterol 108 (90 BASE) MCG/ACT inhaler   Commonly known as: PROVENTIL HFA;VENTOLIN HFA   Inhale 2 puffs into the lungs every 6 (six) hours as needed. For shortness of breath      beclomethasone 40 MCG/ACT inhaler   Commonly known as: QVAR   Inhale 2 puffs into the lungs 2 (two) times daily.      fluticasone 50 MCG/ACT nasal spray   Commonly known as: FLONASE   Place 2 sprays into the nose daily.      insulin detemir 100 UNIT/ML injection   Commonly known as: LEVEMIR   Inject 32 Units into the skin daily.      insulin detemir 100 UNIT/ML injection   Commonly known as: LEVEMIR   Inject 26 Units into the skin at bedtime.      NIFEdipine 10 MG capsule   Commonly known as: PROCARDIA   Take 1 capsule (10 mg total) by mouth every 6 (six) hours.      NOVOLOG FLEXPEN 100 UNIT/ML injection   Generic drug: insulin aspart   Inject into  the skin as directed. Pt injects according to sliding scale, 3-10 units min/max      prenatal multivitamin Tabs   Take 1 tablet by mouth daily.           Follow-up Information    Follow up with Levi Aland, MD. Schedule an appointment as soon as possible for a visit in 1 week.   Contact information:   [address] [place] [address] [phone]          Signed: Loney Laurence 12/12/2011, 9:54 AM

## 2011-12-12 NOTE — Progress Notes (Signed)
Discharge instructions reviewed with patient, pt. Verbalized understanding, copy of instructions given.  Pt. Has friend at the bedside to carry her home.  Pt. Discharged via WC.  Prescription called into pharmacy. Pt. Verbalized understanding of how to take medication.

## 2011-12-12 NOTE — Progress Notes (Signed)
21 y.o. G2P0010 [redacted]w[redacted]d HD#4 admitted for CONTRACTIONS.  Pt currently stable with no c/o since last night.  Good FM.  Filed Vitals:   12/12/11 0820  BP: 104/54  Pulse: 78  Temp: 98.6 F (37 C)  Resp: 20     Lungs CTA Cor RRR Abd  Soft, gravid, nontender Ex SCDs FHTs  120s, good short term variability, NST R Toco  rare  Results for orders placed during the hospital encounter of 12/08/11 (from the past 24 hour(s))  GLUCOSE, CAPILLARY     Status: Abnormal   Collection Time   12/11/11 11:46 AM      Component Value Range   Glucose-Capillary 117 (*) 70 - 99 mg/dL  GLUCOSE, CAPILLARY     Status: Abnormal   Collection Time   12/11/11  2:11 PM      Component Value Range   Glucose-Capillary 197 (*) 70 - 99 mg/dL  GLUCOSE, CAPILLARY     Status: Abnormal   Collection Time   12/11/11  5:18 PM      Component Value Range   Glucose-Capillary 190 (*) 70 - 99 mg/dL   Comment 1 Documented in Chart     Comment 2 Notify RN    GLUCOSE, CAPILLARY     Status: Abnormal   Collection Time   12/11/11  7:22 PM      Component Value Range   Glucose-Capillary 219 (*) 70 - 99 mg/dL   Comment 1 Documented in Chart     Comment 2 Notify RN    GLUCOSE, CAPILLARY     Status: Abnormal   Collection Time   12/11/11 10:08 PM      Component Value Range   Glucose-Capillary 190 (*) 70 - 99 mg/dL  GLUCOSE, CAPILLARY     Status: Abnormal   Collection Time   12/12/11  3:01 AM      Component Value Range   Glucose-Capillary 58 (*) 70 - 99 mg/dL  GLUCOSE, CAPILLARY     Status: Abnormal   Collection Time   12/12/11  3:11 AM      Component Value Range   Glucose-Capillary 55 (*) 70 - 99 mg/dL  GLUCOSE, CAPILLARY     Status: Normal   Collection Time   12/12/11  3:30 AM      Component Value Range   Glucose-Capillary 78  70 - 99 mg/dL  GLUCOSE, CAPILLARY     Status: Normal   Collection Time   12/12/11  6:13 AM      Component Value Range   Glucose-Capillary 87  70 - 99 mg/dL    A:  HD#4  [redacted]w[redacted]d with Type  1 DM and preterm ctxes and shortened cervix.  P: Pt managing sugars- they are better now pt is 24 hours after last dose of BMZ. Procardia at home for ctxes.   Korayma Hagwood A

## 2011-12-13 LAB — CULTURE, BETA STREP (GROUP B ONLY)

## 2011-12-28 ENCOUNTER — Inpatient Hospital Stay (HOSPITAL_COMMUNITY)
Admission: AD | Admit: 2011-12-28 | Discharge: 2011-12-28 | Disposition: A | Discharge status: Hospice | Payer: BC Managed Care – PPO | Source: Ambulatory Visit | Attending: Obstetrics & Gynecology | Admitting: Obstetrics & Gynecology

## 2011-12-28 ENCOUNTER — Encounter (HOSPITAL_COMMUNITY): Payer: Self-pay | Admitting: *Deleted

## 2011-12-28 DIAGNOSIS — O4700 False labor before 37 completed weeks of gestation, unspecified trimester: Secondary | ICD-10-CM

## 2011-12-28 LAB — URINE MICROSCOPIC-ADD ON

## 2011-12-28 LAB — URINALYSIS, ROUTINE W REFLEX MICROSCOPIC
Bilirubin Urine: NEGATIVE
Glucose, UA: >1000 mg/dL — AB
Hgb urine dipstick: NEGATIVE
Ketones, ur: >80 mg/dL — AB
Leukocytes, UA: NEGATIVE
Protein, ur: NEGATIVE mg/dL
pH: 5.5 (ref 5.0–8.0)

## 2011-12-28 MED ORDER — ZOLPIDEM TARTRATE 5 MG PO TABS
5.0000 mg | ORAL_TABLET | Freq: Once | ORAL | Status: AC
  Administered 2011-12-28: 5 mg via ORAL
  Filled 2011-12-28: qty 1

## 2011-12-28 MED ORDER — NIFEDIPINE 10 MG PO CAPS
10.0000 mg | ORAL_CAPSULE | Freq: Once | ORAL | Status: AC
  Administered 2011-12-28: 10 mg via ORAL
  Filled 2011-12-28: qty 1

## 2011-12-28 MED ORDER — ZOLPIDEM TARTRATE 5 MG PO TABS: 5.0000 mg | ORAL_TABLET | Freq: Once | ORAL | Status: DC

## 2011-12-28 NOTE — MAU Provider Note (Signed)
History     CSN: [id number]  Arrival date and time: 12/28/11 1191   First Provider Initiated Contact with Patient 12/28/11 2028      Chief Complaint  Patient presents with  . Contractions   HPI [name] 21 y.o. [redacted]w[redacted]d  Comes to MAU with contractions.  Has been admitted for preterm labor previously.  Was seen in the office today and her cervix was 2 cm.  Went home and took Procardia at 3 pm.  Contractions have not stopped and client reports the contractions today as more severe than when she was admitted.  History of Type 1 Diabetes.  OB History    Grav Para Term Preterm Abortions TAB SAB Ect Mult Living   2 0 0 0 1 1 0 0 0 0       Past Medical History  Diagnosis Date  . Diabetes mellitus   . Asthma   . Hypertension   . Headache     Past Surgical History  Procedure Date  . Wisdom tooth extraction     Family History  Problem Relation Age of Onset  . Diabetes Mother   . Hyperlipidemia Mother   . Hypertension Father   . Heart disease Father   . Heart disease Maternal Grandmother   . Heart disease Maternal Grandfather   . Hypertension Paternal Grandmother   . Cancer Paternal Grandfather     History  Substance Use Topics  . Smoking status: Never Smoker   . Smokeless tobacco: Not on file  . Alcohol Use: No    Allergies:  Allergies  Allergen Reactions  . Peanut-Containing Drug Products Anaphylaxis  . Strawberry Swelling    Prescriptions prior to admission  Medication Sig Dispense Refill  . acetaminophen (TYLENOL) 325 MG tablet Take 650 mg by mouth every 6 (six) hours as needed. For pain      . albuterol (PROVENTIL HFA;VENTOLIN HFA) 108 (90 BASE) MCG/ACT inhaler Inhale 2 puffs into the lungs every 6 (six) hours as needed. For shortness of breath      . beclomethasone (QVAR) 40 MCG/ACT inhaler Inhale 2 puffs into the lungs 2 (two) times daily.      . fluticasone (FLONASE) 50 MCG/ACT nasal spray Place 2 sprays into the nose daily.      . insulin aspart  (NOVOLOG FLEXPEN) 100 UNIT/ML injection Inject into the skin as directed. Pt injects according to sliding scale, 3-10 units min/max      . insulin detemir (LEVEMIR) 100 UNIT/ML injection Inject 32 Units into the skin daily.      . insulin detemir (LEVEMIR) 100 UNIT/ML injection Inject 26 Units into the skin at bedtime.      [name] NIFEdipine (PROCARDIA) 10 MG capsule Take 1 capsule (10 mg total) by mouth every 6 (six) hours.  30 capsule  1  . Prenatal Vit-Fe Fumarate-FA (PRENATAL MULTIVITAMIN) TABS Take 1 tablet by mouth daily.        Review of Systems  Constitutional: Negative for fever.  Gastrointestinal: Positive for abdominal pain. Negative for nausea, vomiting, diarrhea and constipation.  Genitourinary: Negative for dysuria.       Having contractions. No vaginal bleeding. No leaking.   Physical Exam   Blood pressure 138/86, pulse 109, temperature 98.2 F (36.8 C), temperature source Oral, resp. rate 18, height 5\' 4"  (1.626 m), weight 65.318 kg (144 lb), SpO2 100.00%.  Physical Exam  Nursing note and vitals reviewed. Constitutional: She is oriented to person, place, and time. She appears well-developed and well-nourished.  HENT:  Head: Normocephalic.  Eyes: EOM are normal.  Neck: Neck supple.  GI: Soft. There is tenderness. There is no rebound and no guarding.       Contractions are 3-4 minutes apart.  Genitourinary:       Bimanual - cervix soft, posterior, 2 cm, vertex, -2  Musculoskeletal: Normal range of motion.  Neurological: She is alert and oriented to person, place, and time.  Skin: Skin is warm and dry.  Psychiatric: She has a normal mood and affect.  FHT baseline 155.  FHT reactive with movement while provider in room with client palpating contractions.  MAU Course  Procedures  MDM 2050  Consult with Dr. Arlyce Dice.  Will give Procardia 10 mg PO now. 2100   Care assumed by H. Mathews Robinsons, CNM  Assessment and Plan    BURLESON,TERRI 12/28/2011, 8:53 PM

## 2011-12-28 NOTE — MAU Note (Signed)
Contractions all day today, seen at MD today. Taking procardia since yesterday but it doesn't seem to be working. Denies bleeding or ROM

## 2011-12-28 NOTE — MAU Provider Note (Signed)
History     CSN: [id number]  Arrival date and time: 12/28/11 7829   First Provider Initiated Contact with Patient 12/28/11 2028      Chief Complaint  Patient presents with  . Contractions   HPI  Please see prior note. Note was not signed, and I was unable to record on that note.   Past Medical History  Diagnosis Date  . Diabetes mellitus   . Asthma   . Hypertension   . Headache     Past Surgical History  Procedure Date  . Wisdom tooth extraction     Family History  Problem Relation Age of Onset  . Diabetes Mother   . Hyperlipidemia Mother   . Hypertension Father   . Heart disease Father   . Heart disease Maternal Grandmother   . Heart disease Maternal Grandfather   . Hypertension Paternal Grandmother   . Cancer Paternal Grandfather     History  Substance Use Topics  . Smoking status: Never Smoker   . Smokeless tobacco: Not on file  . Alcohol Use: No    Allergies:  Allergies  Allergen Reactions  . Peanut-Containing Drug Products Anaphylaxis  . Strawberry Swelling    Prescriptions prior to admission  Medication Sig Dispense Refill  . beclomethasone (QVAR) 40 MCG/ACT inhaler Inhale 2 puffs into the lungs 2 (two) times daily.      . fluticasone (FLONASE) 50 MCG/ACT nasal spray Place 2 sprays into the nose daily.      . insulin aspart (NOVOLOG FLEXPEN) 100 UNIT/ML injection Inject into the skin as directed. Pt injects according to sliding scale, 3-10 units min/max      . insulin detemir (LEVEMIR) 100 UNIT/ML injection Inject 32 Units into the skin daily.      . insulin detemir (LEVEMIR) 100 UNIT/ML injection Inject 26 Units into the skin at bedtime.      [name] NIFEdipine (PROCARDIA) 10 MG capsule Take 1 capsule (10 mg total) by mouth every 6 (six) hours.  30 capsule  1  . Prenatal Vit-Fe Fumarate-FA (PRENATAL MULTIVITAMIN) TABS Take 1 tablet by mouth daily.      [name] acetaminophen (TYLENOL) 325 MG tablet Take 650 mg by mouth every 6 (six) hours as needed. For  pain      . albuterol (PROVENTIL HFA;VENTOLIN HFA) 108 (90 BASE) MCG/ACT inhaler Inhale 2 puffs into the lungs every 6 (six) hours as needed. For shortness of breath        ROS Physical Exam   Blood pressure 138/86, pulse 109, temperature 98.2 F (36.8 C), temperature source Oral, resp. rate 18, height 5\' 4"  (1.626 m), weight 65.318 kg (144 lb), SpO2 100.00%.  Physical Exam  MAU Course  Procedures  2200: Pt states that UCs are decreased in strength since dose of procardia.  SVE: 2/50/high  2203: Spoke with Dr. Arlyce Dice. Pt is OK for d/c home. Please give ambien prior to dc.   Assessment and Plan   1. Preterm uterine contractions, antepartum    FU with Dr. Marzetta Board office as scheduled.   [name], [name]  Home Medication Instructions FAO:[id number]   Printed on:12/28/11 2207  Medication Information                    Prenatal Vit-Fe Fumarate-FA (PRENATAL MULTIVITAMIN) TABS Take 1 tablet by mouth daily.           fluticasone (FLONASE) 50 MCG/ACT nasal spray Place 2 sprays into the nose daily.  albuterol (PROVENTIL HFA;VENTOLIN HFA) 108 (90 BASE) MCG/ACT inhaler Inhale 2 puffs into the lungs every 6 (six) hours as needed. For shortness of breath           beclomethasone (QVAR) 40 MCG/ACT inhaler Inhale 2 puffs into the lungs 2 (two) times daily.           insulin aspart (NOVOLOG FLEXPEN) 100 UNIT/ML injection Inject into the skin as directed. Pt injects according to sliding scale, 3-10 units min/max           insulin detemir (LEVEMIR) 100 UNIT/ML injection Inject 32 Units into the skin daily.           insulin detemir (LEVEMIR) 100 UNIT/ML injection Inject 26 Units into the skin at bedtime.           acetaminophen (TYLENOL) 325 MG tablet Take 650 mg by mouth every 6 (six) hours as needed. For pain           NIFEdipine (PROCARDIA) 10 MG capsule Take 1 capsule (10 mg total) by mouth every 6 (six) hours.           zolpidem (AMBIEN) 5 MG tablet Take 1 tablet  (5 mg total) by mouth once.              Tawnya Crook 12/28/2011, 10:00 PM

## 2012-01-06 ENCOUNTER — Encounter (HOSPITAL_COMMUNITY): Payer: Self-pay | Admitting: Anesthesiology

## 2012-01-06 ENCOUNTER — Encounter (HOSPITAL_COMMUNITY): Payer: Self-pay | Admitting: *Deleted

## 2012-01-06 ENCOUNTER — Inpatient Hospital Stay (HOSPITAL_COMMUNITY)
Admission: AD | Admit: 2012-01-06 | Discharge: 2012-01-09 | DRG: 372 | Disposition: A | Discharge status: Home / Self Care | Payer: BC Managed Care – PPO | Source: Ambulatory Visit | Attending: Obstetrics and Gynecology | Admitting: Obstetrics and Gynecology

## 2012-01-06 ENCOUNTER — Inpatient Hospital Stay (HOSPITAL_COMMUNITY): Payer: BC Managed Care – PPO | Admitting: Anesthesiology

## 2012-01-06 DIAGNOSIS — E119 Type 2 diabetes mellitus without complications: Secondary | ICD-10-CM | POA: Diagnosis present

## 2012-01-06 DIAGNOSIS — E049 Nontoxic goiter, unspecified: Secondary | ICD-10-CM

## 2012-01-06 DIAGNOSIS — O2432 Unspecified pre-existing diabetes mellitus in childbirth: Secondary | ICD-10-CM | POA: Diagnosis present

## 2012-01-06 DIAGNOSIS — I10 Essential (primary) hypertension: Secondary | ICD-10-CM

## 2012-01-06 DIAGNOSIS — E1065 Type 1 diabetes mellitus with hyperglycemia: Secondary | ICD-10-CM

## 2012-01-06 DIAGNOSIS — IMO0002 Reserved for concepts with insufficient information to code with codable children: Principal | ICD-10-CM

## 2012-01-06 DIAGNOSIS — Z3480 Encounter for supervision of other normal pregnancy, unspecified trimester: Secondary | ICD-10-CM

## 2012-01-06 DIAGNOSIS — R0600 Dyspnea, unspecified: Secondary | ICD-10-CM

## 2012-01-06 LAB — URINALYSIS, ROUTINE W REFLEX MICROSCOPIC
Ketones, ur: 15 mg/dL — AB
Leukocytes, UA: NEGATIVE
Nitrite: NEGATIVE
Protein, ur: 100 mg/dL — AB
Urobilinogen, UA: 0.2 mg/dL (ref 0.0–1.0)

## 2012-01-06 LAB — GLUCOSE, CAPILLARY
Glucose-Capillary: 200 mg/dL — ABNORMAL HIGH (ref 70–99)
Glucose-Capillary: 246 mg/dL — ABNORMAL HIGH (ref 70–99)
Glucose-Capillary: 226 mg/dL — ABNORMAL HIGH (ref 70–99)
Glucose-Capillary: 96 mg/dL (ref 70–99)
Glucose-Capillary: 102 mg/dL — ABNORMAL HIGH (ref 70–99)
Glucose-Capillary: 117 mg/dL — ABNORMAL HIGH (ref 70–99)

## 2012-01-06 LAB — RPR: RPR Ser Ql: NONREACTIVE

## 2012-01-06 LAB — COMPREHENSIVE METABOLIC PANEL
ALT: 16 U/L (ref 0–35)
Albumin: 2.3 g/dL — ABNORMAL LOW (ref 3.5–5.2)
BUN: 6 mg/dL (ref 6–23)
Calcium: 8.8 mg/dL (ref 8.4–10.5)
GFR calc Af Amer: >90 mL/min (ref >90)
Glucose, Bld: 219 mg/dL — ABNORMAL HIGH (ref 70–99)
Sodium: 136 mEq/L (ref 135–145)
Total Protein: 5.8 g/dL — ABNORMAL LOW (ref 6.0–8.3)

## 2012-01-06 LAB — OB RESULTS CONSOLE GC/CHLAMYDIA
Chlamydia: NEGATIVE
Gonorrhea: NEGATIVE

## 2012-01-06 LAB — CBC
Hemoglobin: 12.8 g/dL (ref 12.0–15.0)
MCH: 29.2 pg (ref 26.0–34.0)
MCHC: 35.3 g/dL (ref 30.0–36.0)
RDW: 13.4 % (ref 11.5–15.5)

## 2012-01-06 LAB — URIC ACID: Uric Acid, Serum: 4.5 mg/dL (ref 2.4–7.0)

## 2012-01-06 LAB — OB RESULTS CONSOLE GBS: GBS: NEGATIVE

## 2012-01-06 LAB — WET PREP, GENITAL
Clue Cells Wet Prep HPF POC: NONE SEEN
Yeast Wet Prep HPF POC: NONE SEEN

## 2012-01-06 MED ORDER — OXYTOCIN 40 UNITS IN LACTATED RINGERS INFUSION - SIMPLE MED
62.5000 mL/h | INTRAVENOUS | Status: DC
  Filled 2012-01-06: qty 1000

## 2012-01-06 MED ORDER — PHENYLEPHRINE 40 MCG/ML (10ML) SYRINGE FOR IV PUSH (FOR BLOOD PRESSURE SUPPORT): 80.0000 ug | PREFILLED_SYRINGE | INTRAVENOUS | Status: DC | PRN

## 2012-01-06 MED ORDER — OXYTOCIN BOLUS FROM INFUSION
500.0000 mL | INTRAVENOUS | Status: DC
  Administered 2012-01-06: 500 mL via INTRAVENOUS

## 2012-01-06 MED ORDER — LACTATED RINGERS IV SOLN: 500.0000 mL | Freq: Once | INTRAVENOUS | Status: DC

## 2012-01-06 MED ORDER — LACTATED RINGERS IV SOLN: 500.0000 mL | INTRAVENOUS | Status: DC | PRN

## 2012-01-06 MED ORDER — OXYCODONE-ACETAMINOPHEN 5-325 MG PO TABS: 1.0000 | ORAL_TABLET | ORAL | Status: DC | PRN

## 2012-01-06 MED ORDER — BUTORPHANOL TARTRATE 1 MG/ML IJ SOLN
1.0000 mg | INTRAMUSCULAR | Status: DC | PRN
Start: 2012-01-06 — End: 2012-01-07
  Administered 2012-01-06 (×2): 1 mg via INTRAVENOUS
  Filled 2012-01-06 (×2): qty 1

## 2012-01-06 MED ORDER — EPHEDRINE 5 MG/ML INJ
10.0000 mg | INTRAVENOUS | Status: DC | PRN
  Filled 2012-01-06: qty 4

## 2012-01-06 MED ORDER — SODIUM CHLORIDE 0.9 % IV SOLN
INTRAVENOUS | Status: DC
  Administered 2012-01-06: 1.9 [IU]/h via INTRAVENOUS
  Filled 2012-01-06: qty 1

## 2012-01-06 MED ORDER — ONDANSETRON HCL 4 MG/2ML IJ SOLN: 4.0000 mg | Freq: Four times a day (QID) | INTRAMUSCULAR | Status: DC | PRN

## 2012-01-06 MED ORDER — CITRIC ACID-SODIUM CITRATE 334-500 MG/5ML PO SOLN: 30.0000 mL | ORAL | Status: DC | PRN

## 2012-01-06 MED ORDER — OXYTOCIN 40 UNITS IN LACTATED RINGERS INFUSION - SIMPLE MED
1.0000 m[IU]/min | INTRAVENOUS | Status: DC
  Administered 2012-01-06: 2 m[IU]/min via INTRAVENOUS

## 2012-01-06 MED ORDER — FENTANYL 2.5 MCG/ML BUPIVACAINE 1/10 % EPIDURAL INFUSION (WH - ANES)
14.0000 mL/h | INTRAMUSCULAR | Status: DC
  Administered 2012-01-06: 14 mL/h via EPIDURAL
  Filled 2012-01-06 (×2): qty 125

## 2012-01-06 MED ORDER — LIDOCAINE HCL (PF) 1 % IJ SOLN
30.0000 mL | INTRAMUSCULAR | Status: DC | PRN
  Filled 2012-01-06: qty 30

## 2012-01-06 MED ORDER — FENTANYL 2.5 MCG/ML BUPIVACAINE 1/10 % EPIDURAL INFUSION (WH - ANES)
INTRAMUSCULAR | Status: DC | PRN
  Administered 2012-01-06: 14 mL/h via EPIDURAL

## 2012-01-06 MED ORDER — DIPHENHYDRAMINE HCL 50 MG/ML IJ SOLN: 12.5000 mg | INTRAMUSCULAR | Status: DC | PRN

## 2012-01-06 MED ORDER — EPHEDRINE 5 MG/ML INJ: 10.0000 mg | INTRAVENOUS | Status: DC | PRN

## 2012-01-06 MED ORDER — TERBUTALINE SULFATE 1 MG/ML IJ SOLN: 0.2500 mg | Freq: Once | INTRAMUSCULAR | Status: AC | PRN

## 2012-01-06 MED ORDER — PHENYLEPHRINE 40 MCG/ML (10ML) SYRINGE FOR IV PUSH (FOR BLOOD PRESSURE SUPPORT)
80.0000 ug | PREFILLED_SYRINGE | INTRAVENOUS | Status: DC | PRN
  Filled 2012-01-06: qty 5

## 2012-01-06 MED ORDER — ACETAMINOPHEN 325 MG PO TABS: 650.0000 mg | ORAL_TABLET | ORAL | Status: DC | PRN

## 2012-01-06 MED ORDER — LACTATED RINGERS IV SOLN
INTRAVENOUS | Status: DC
  Administered 2012-01-06 (×2): via INTRAVENOUS

## 2012-01-06 MED ORDER — NIFEDIPINE 10 MG PO CAPS
10.0000 mg | ORAL_CAPSULE | Freq: Once | ORAL | Status: AC
  Administered 2012-01-06: 10 mg via ORAL
  Filled 2012-01-06: qty 1

## 2012-01-06 MED ORDER — LIDOCAINE HCL (PF) 1 % IJ SOLN
INTRAMUSCULAR | Status: DC | PRN
  Administered 2012-01-06 (×2): 4 mL

## 2012-01-06 MED ORDER — IBUPROFEN 600 MG PO TABS: 600.0000 mg | ORAL_TABLET | Freq: Four times a day (QID) | ORAL | Status: DC | PRN

## 2012-01-06 NOTE — MAU Provider Note (Signed)
History     CSN: [id number]  Arrival date and time: 01/06/12 0547   Seen by provider at 6:15 am     No chief complaint on file.  HPI [name] 21 y.o. [redacted]w[redacted]d  Comes to MAU thinking her water broke.  When she got up to the bathroom this morning she noticed she was wet between her legs.  No gush of fluid, no puddle of fluid, no leaking with contractions.  Having contractions with abdominal pain.n  Has been on procardia and took last dose at 7 pm last night.  OB History    Grav Para Term Preterm Abortions TAB SAB Ect Mult Living   2 0 0 0 1 1 0 0 0 0       Past Medical History  Diagnosis Date  . Diabetes mellitus   . Asthma   . Hypertension   . Headache     Past Surgical History  Procedure Date  . Wisdom tooth extraction     Family History  Problem Relation Age of Onset  . Diabetes Mother   . Hyperlipidemia Mother   . Hypertension Father   . Heart disease Father   . Heart disease Maternal Grandmother   . Heart disease Maternal Grandfather   . Hypertension Paternal Grandmother   . Cancer Paternal Grandfather     History  Substance Use Topics  . Smoking status: Never Smoker   . Smokeless tobacco: Not on file  . Alcohol Use: No    Allergies:  Allergies  Allergen Reactions  . Peanut-Containing Drug Products Anaphylaxis  . Strawberry Swelling    Prescriptions prior to admission  Medication Sig Dispense Refill  . beclomethasone (QVAR) 40 MCG/ACT inhaler Inhale 2 puffs into the lungs 2 (two) times daily.      . insulin aspart (NOVOLOG FLEXPEN) 100 UNIT/ML injection Inject into the skin as directed. Pt injects according to sliding scale, 3-10 units min/max      . insulin detemir (LEVEMIR) 100 UNIT/ML injection Inject 32 Units into the skin daily.      . insulin detemir (LEVEMIR) 100 UNIT/ML injection Inject 26 Units into the skin at bedtime.      [name] NIFEdipine (PROCARDIA) 10 MG capsule Take 1 capsule (10 mg total) by mouth every 6 (six) hours.  30 capsule   1  . Prenatal Vit-Fe Fumarate-FA (PRENATAL MULTIVITAMIN) TABS Take 1 tablet by mouth daily.      [name] zolpidem (AMBIEN) 5 MG tablet Take 1 tablet (5 mg total) by mouth once.  10 tablet  0  . acetaminophen (TYLENOL) 325 MG tablet Take 650 mg by mouth every 6 (six) hours as needed. For pain      . albuterol (PROVENTIL HFA;VENTOLIN HFA) 108 (90 BASE) MCG/ACT inhaler Inhale 2 puffs into the lungs every 6 (six) hours as needed. For shortness of breath      . fluticasone (FLONASE) 50 MCG/ACT nasal spray Place 2 sprays into the nose daily.        Review of Systems  Gastrointestinal: Positive for abdominal pain. Negative for nausea, vomiting, diarrhea and constipation.  Genitourinary:       Leaking of fluid No dysuria No vaginal bleeding   Physical Exam   Blood pressure 141/98, pulse 96, temperature 98.3 F (36.8 C), temperature source Oral, resp. rate 20, height 5\' 3"  (1.6 m), weight 150 lb 8 oz (68.266 kg). Repeat BP 153/89 Physical Exam  Nursing note and vitals reviewed. Constitutional: She is oriented to person, place, and time. She  appears well-developed and well-nourished.  HENT:  Head: Normocephalic.  Eyes: EOM are normal.  Neck: Neck supple.  GI: Soft. There is tenderness. There is no rebound and no guarding.       Contractions every 3 minutes.  FHT baseline 140.  Genitourinary:       Speculum exam No bleeding, yellow vaginal discharge, no pooling, no leaking with valsalva. Cervical exam- 3 cm, 60 %, bloody show noted with cervical exam  Musculoskeletal: Normal range of motion.  Neurological: She is alert and oriented to person, place, and time.  Skin: Skin is warm and dry.  Psychiatric: She has a normal mood and affect.    MAU Course  Procedures  MDM Fern slide was negative. Consult with Dr. Dareen Piano re: plan of care - Procardia, [facility] labs Results for orders placed during the hospital encounter of 01/06/12 (from the past 24 hour(s))  WET PREP, GENITAL     Status: Abnormal    Collection Time   01/06/12  6:15 AM      Component Value Range   Yeast Wet Prep HPF POC NONE SEEN  NONE SEEN   Trich, Wet Prep NONE SEEN  NONE SEEN   Clue Cells Wet Prep HPF POC NONE SEEN  NONE SEEN   WBC, Wet Prep HPF POC MANY (*) NONE SEEN  GLUCOSE, CAPILLARY     Status: Abnormal   Collection Time   01/06/12  6:47 AM      Component Value Range   Glucose-Capillary 200 (*) 70 - 99 mg/dL  CBC     Status: Normal   Collection Time   01/06/12  6:55 AM      Component Value Range   WBC 5.3  4.0 - 10.5 K/uL   RBC 4.39  3.87 - 5.11 MIL/uL   Hemoglobin 12.8  12.0 - 15.0 g/dL   HCT 62.1  30.8 - 65.7 %   MCV 82.7  78.0 - 100.0 fL   MCH 29.2  26.0 - 34.0 pg   MCHC 35.3  30.0 - 36.0 g/dL   RDW 84.6  96.2 - 95.2 %   Platelets 258  150 - 400 K/uL  COMPREHENSIVE METABOLIC PANEL     Status: Abnormal   Collection Time   01/06/12  6:55 AM      Component Value Range   Sodium 136  135 - 145 mEq/L   Potassium 3.0 (*) 3.5 - 5.1 mEq/L   Chloride 102  96 - 112 mEq/L   CO2 22  19 - 32 mEq/L   Glucose, Bld 219 (*) 70 - 99 mg/dL   BUN 6  6 - 23 mg/dL   Creatinine, Ser 8.41  0.50 - 1.10 mg/dL   Calcium 8.8  8.4 - 32.4 mg/dL   Total Protein 5.8 (*) 6.0 - 8.3 g/dL   Albumin 2.3 (*) 3.5 - 5.2 g/dL   AST 23  0 - 37 U/L   ALT 16  0 - 35 U/L   Alkaline Phosphatase 186 (*) 39 - 117 U/L   Total Bilirubin 0.4  0.3 - 1.2 mg/dL   GFR calc non Af Amer >90  >90 mL/min   GFR calc Af Amer >90  >90 mL/min  URIC ACID     Status: Normal   Collection Time   01/06/12  6:55 AM      Component Value Range   Uric Acid, Serum 4.5  2.4 - 7.0 mg/dL  LACTATE DEHYDROGENASE     Status: Normal   Collection  Time   01/06/12  6:55 AM      Component Value Range   LDH 173  94 - 250 U/L   Arnol Mcgibbon 01/06/2012, 8:48 AM   Discussed lab results, blood pressure and elevated blood glucose with Dr. Arlyce Dice. Will order urine and he will be in to evaluate.   Patient having contractions and complains of nausea now.   BP  141/98  Pulse 96  Temp 98.3 F (36.8 C) (Oral)  Resp 20  Ht 5\' 3"  (1.6 m)  Wt 150 lb 8 oz (68.266 kg)  BMI 26.66 kg/m2

## 2012-01-06 NOTE — Anesthesia Preprocedure Evaluation (Addendum)
Anesthesia Evaluation  Patient identified by MRN, date of birth, ID band Patient awake    Reviewed: Allergy & Precautions, H&P , Patient's Chart, lab work & pertinent test results  Airway Mallampati: III TM Distance: >3 FB Neck ROM: full    Dental No notable dental hx. (+) Teeth Intact   Pulmonary shortness of breath and with exertion, asthma ,  breath sounds clear to auscultation  Pulmonary exam normal       Cardiovascular hypertension, Pt. on medications Rhythm:regular Rate:Normal     Neuro/Psych  Headaches, negative psych ROS   GI/Hepatic negative GI ROS, Neg liver ROS,   Endo/Other  diabetes, Well Controlled, Type 1, Insulin Dependent  Renal/GU negative Renal ROS  negative genitourinary   Musculoskeletal   Abdominal Normal abdominal exam  (+)   Peds  Hematology negative hematology ROS (+)   Anesthesia Other Findings   Reproductive/Obstetrics (+) Pregnancy                           Anesthesia Physical Anesthesia Plan  ASA: II  Anesthesia Plan: Epidural   Post-op Pain Management:    Induction:   Airway Management Planned:   Additional Equipment:   Intra-op Plan:   Post-operative Plan:   Informed Consent: I have reviewed the patients History and Physical, chart, labs and discussed the procedure including the risks, benefits and alternatives for the proposed anesthesia with the patient or authorized representative who has indicated his/her understanding and acceptance.     Plan Discussed with: Anesthesiologist  Anesthesia Plan Comments:         Anesthesia Quick Evaluation

## 2012-01-06 NOTE — MAU Note (Signed)
PT SAYS  SHE WAS ASLEEP AND AWOKE TO GO TO B-ROOM AT 0530-  FELT  HER  INNER THIGHS WERE WET.     DOES  NOT FEEL ANY FLUID COMING  OUT NOW.   VE  ON MON 12-16  3 CM.  TAKING PROCARDIA.   DENIES HSV AND MRSA.

## 2012-01-06 NOTE — Anesthesia Procedure Notes (Signed)
Epidural Patient location during procedure: OB Start time: 01/06/2012 12:32 PM  Staffing Anesthesiologist: Jacobi Ryant A. Performed by: anesthesiologist   Preanesthetic Checklist Completed: patient identified, site marked, surgical consent, pre-op evaluation, timeout performed, IV checked, risks and benefits discussed and monitors and equipment checked  Epidural Patient position: sitting Prep: site prepped and draped and DuraPrep Patient monitoring: continuous pulse ox and blood pressure Approach: midline Injection technique: LOR air  Needle:  Needle type: Tuohy  Needle gauge: 17 G Needle length: 9 cm and 9 Needle insertion depth: 4 cm Catheter type: closed end flexible Catheter size: 19 Gauge Catheter at skin depth: 9 cm Test dose: negative and Other  Assessment Events: blood not aspirated, injection not painful, no injection resistance, negative IV test and no paresthesia  Additional Notes Patient identified. Risks and benefits discussed including failed block, incomplete  Pain control, post dural puncture headache, nerve damage, paralysis, blood pressure Changes, nausea, vomiting, reactions to medications-both toxic and allergic and post Partum back pain. All questions were answered. Patient expressed understanding and wished to proceed. Sterile technique was used throughout procedure. Epidural site was Dressed with sterile barrier dressing. No paresthesias, signs of intravascular injection Or signs of intrathecal spread were encountered.  Patient was more comfortable after the epidural was dosed. Please see RN's note for documentation of vital signs and FHR which are stable.

## 2012-01-06 NOTE — Plan of Care (Signed)
Problem: Consults Goal: Birthing Suites Patient Information Press F2 to bring up selections list   Pt < [redacted] weeks EGA     

## 2012-01-06 NOTE — MAU Note (Signed)
PT SAYS SHE VOMITING ON MON NIGHT AND Tuesday-  STOPPED AT 3 PM ON Tuesday.  WAS ABLE TO KEEP DOWN PROCARDIA  AND BS WERE   NL.

## 2012-01-06 NOTE — H&P (Signed)
21 y.o. G2 P0010  Estimated Date of Delivery: 02/05/12 admitted at 35/[redacted] weeks gestation in labor.  Patient presented to MAU with possible leaking.  In MAU she appeared not to be ruptured but she has been having painful uterine contractions 2-3 minutes with cervical change from 3/50 to 4/90 in 2 hours.  She has persistent BP elevations above 140/90.  Her PIH labs are normal.  Her spot urine showed 100 mg/dl of protein.  Prenatal Transfer Tool  Maternal Diabetes: Yes:  Diabetes Type:  Pre-pregnancy Genetic Screening: Normal Maternal Ultrasounds/Referrals: Normal Fetal Ultrasounds or other Referrals:  None Maternal Substance Abuse:  No Significant Maternal Medications:  Meds include: Other: Insulin Significant Maternal Lab Results: Lab values include: Other: Normal 1st trimester 24 hour urine protein, Creatinine clearance, and TSH.  1st trimester Hb A1C was 8.5. Other Significant Pregnancy Complications:  Late preterm labor, Preeclampsia, Poorly controlled pre-pregnancy DM.   Afebrile, 140/101 Heart and Lungs: No active disease Abdomen: soft, gravid, EFW AGA. Cervical exam:  4/90, vtx -1  Impression: Late preterm labor, pre-eclampsia without severe features, poorly controlled pre-pregnancy diabetes.  Plan:  Vaginal delivery

## 2012-01-07 LAB — GLUCOSE, CAPILLARY
Glucose-Capillary: 136 mg/dL — ABNORMAL HIGH (ref 70–99)
Glucose-Capillary: 226 mg/dL — ABNORMAL HIGH (ref 70–99)
Glucose-Capillary: 269 mg/dL — ABNORMAL HIGH (ref 70–99)
Glucose-Capillary: 399 mg/dL — ABNORMAL HIGH (ref 70–99)
Glucose-Capillary: 88 mg/dL (ref 70–99)

## 2012-01-07 LAB — CBC
MCH: 29.3 pg (ref 26.0–34.0)
MCHC: 35.6 g/dL (ref 30.0–36.0)
MCV: 82.4 fL (ref 78.0–100.0)
Platelets: 232 10*3/uL (ref 150–400)
RDW: 13.5 % (ref 11.5–15.5)
WBC: 12.4 10*3/uL — ABNORMAL HIGH (ref 4.0–10.5)

## 2012-01-07 MED ORDER — TETANUS-DIPHTH-ACELL PERTUSSIS 5-2.5-18.5 LF-MCG/0.5 IM SUSP
0.5000 mL | Freq: Once | INTRAMUSCULAR | Status: AC
  Administered 2012-01-08: 0.5 mL via INTRAMUSCULAR

## 2012-01-07 MED ORDER — PRENATAL MULTIVITAMIN CH
1.0000 | ORAL_TABLET | Freq: Every day | ORAL | Status: DC
  Administered 2012-01-07 – 2012-01-08 (×2): 1 via ORAL
  Filled 2012-01-07 (×2): qty 1

## 2012-01-07 MED ORDER — ONDANSETRON HCL 4 MG/2ML IJ SOLN: 4.0000 mg | INTRAMUSCULAR | Status: DC | PRN

## 2012-01-07 MED ORDER — OXYCODONE-ACETAMINOPHEN 5-325 MG PO TABS
1.0000 | ORAL_TABLET | ORAL | Status: DC | PRN
  Administered 2012-01-07: 1 via ORAL
  Administered 2012-01-07: 2 via ORAL
  Administered 2012-01-07 (×2): 1 via ORAL
  Administered 2012-01-07 – 2012-01-09 (×6): 2 via ORAL
  Filled 2012-01-07 (×2): qty 2
  Filled 2012-01-07: qty 1
  Filled 2012-01-07 (×3): qty 2
  Filled 2012-01-07: qty 1
  Filled 2012-01-07: qty 2
  Filled 2012-01-07: qty 1
  Filled 2012-01-07: qty 2

## 2012-01-07 MED ORDER — ZOLPIDEM TARTRATE 5 MG PO TABS: 5.0000 mg | ORAL_TABLET | Freq: Every evening | ORAL | Status: DC | PRN

## 2012-01-07 MED ORDER — INSULIN DETEMIR 100 UNIT/ML ~~LOC~~ SOLN
32.0000 [IU] | Freq: Every day | SUBCUTANEOUS | Status: DC
  Administered 2012-01-07 – 2012-01-09 (×3): 32 [IU] via SUBCUTANEOUS
  Filled 2012-01-07: qty 10

## 2012-01-07 MED ORDER — SIMETHICONE 80 MG PO CHEW
80.0000 mg | CHEWABLE_TABLET | ORAL | Status: DC | PRN
  Administered 2012-01-07: 80 mg via ORAL

## 2012-01-07 MED ORDER — BENZOCAINE-MENTHOL 20-0.5 % EX AERO
1.0000 "application " | INHALATION_SPRAY | CUTANEOUS | Status: DC | PRN
  Filled 2012-01-07 (×2): qty 56

## 2012-01-07 MED ORDER — DIPHENHYDRAMINE HCL 25 MG PO CAPS: 25.0000 mg | ORAL_CAPSULE | Freq: Four times a day (QID) | ORAL | Status: DC | PRN

## 2012-01-07 MED ORDER — INSULIN ASPART 100 UNIT/ML ~~LOC~~ SOLN
0.0000 [IU] | Freq: Every day | SUBCUTANEOUS | Status: DC
  Administered 2012-01-07: 2 [IU] via SUBCUTANEOUS

## 2012-01-07 MED ORDER — WITCH HAZEL-GLYCERIN EX PADS: 1.0000 "application " | MEDICATED_PAD | CUTANEOUS | Status: DC | PRN

## 2012-01-07 MED ORDER — LANOLIN HYDROUS EX OINT: TOPICAL_OINTMENT | CUTANEOUS | Status: DC | PRN

## 2012-01-07 MED ORDER — INSULIN DETEMIR 100 UNIT/ML ~~LOC~~ SOLN: 26.0000 [IU] | Freq: Every day | SUBCUTANEOUS | Status: DC

## 2012-01-07 MED ORDER — PNEUMOCOCCAL VAC POLYVALENT 25 MCG/0.5ML IJ INJ
0.5000 mL | INJECTION | Freq: Once | INTRAMUSCULAR | Status: AC
  Administered 2012-01-07: 0.5 mL via INTRAMUSCULAR
  Filled 2012-01-07: qty 0.5

## 2012-01-07 MED ORDER — INSULIN ASPART 100 UNIT/ML ~~LOC~~ SOLN: 3.0000 [IU] | SUBCUTANEOUS | Status: DC

## 2012-01-07 MED ORDER — IBUPROFEN 600 MG PO TABS
600.0000 mg | ORAL_TABLET | Freq: Four times a day (QID) | ORAL | Status: DC
  Administered 2012-01-07: 600 mg via ORAL

## 2012-01-07 MED ORDER — SENNOSIDES-DOCUSATE SODIUM 8.6-50 MG PO TABS
2.0000 | ORAL_TABLET | Freq: Every day | ORAL | Status: DC
  Administered 2012-01-07 – 2012-01-08 (×2): 2 via ORAL

## 2012-01-07 MED ORDER — INSULIN ASPART 100 UNIT/ML ~~LOC~~ SOLN
0.0000 [IU] | Freq: Three times a day (TID) | SUBCUTANEOUS | Status: DC
Start: 2012-01-07 — End: 2012-01-09
  Administered 2012-01-07: 12 [IU] via SUBCUTANEOUS
  Administered 2012-01-07: 4 [IU] via SUBCUTANEOUS
  Administered 2012-01-07: 15 [IU] via SUBCUTANEOUS
  Administered 2012-01-08 (×2): 6 [IU] via SUBCUTANEOUS
  Administered 2012-01-08: 5 [IU] via SUBCUTANEOUS
  Administered 2012-01-09: 4 [IU] via SUBCUTANEOUS

## 2012-01-07 MED ORDER — INSULIN ASPART 100 UNIT/ML ~~LOC~~ SOLN
10.0000 [IU] | Freq: Once | SUBCUTANEOUS | Status: AC
  Administered 2012-01-07: 10 [IU] via SUBCUTANEOUS

## 2012-01-07 MED ORDER — INSULIN ASPART 100 UNIT/ML ~~LOC~~ SOLN
5.0000 [IU] | Freq: Once | SUBCUTANEOUS | Status: AC
  Administered 2012-01-07: 5 [IU] via SUBCUTANEOUS

## 2012-01-07 MED ORDER — INSULIN DETEMIR 100 UNIT/ML ~~LOC~~ SOLN
26.0000 [IU] | Freq: Every day | SUBCUTANEOUS | Status: DC
  Administered 2012-01-07 – 2012-01-08 (×2): 26 [IU] via SUBCUTANEOUS

## 2012-01-07 MED ORDER — DIBUCAINE 1 % RE OINT: 1.0000 "application " | TOPICAL_OINTMENT | RECTAL | Status: DC | PRN

## 2012-01-07 MED ORDER — ONDANSETRON HCL 4 MG PO TABS: 4.0000 mg | ORAL_TABLET | ORAL | Status: DC | PRN

## 2012-01-07 NOTE — Progress Notes (Signed)
0645 CBG 399.  Ordered serum gluccose STAT and contacted Arlyce Dice MD.  Informed Arlyce Dice of BS of 399 and according to custom scale patient will be receiving 12 units of Novolog. Arlyce Dice agreed.  Will wait to give insulin until breakfast tray arrives and serum glucose confirms. [name]

## 2012-01-07 NOTE — Progress Notes (Signed)
Patient will give herself short acting insulin on sliding scale.  Will start long acting insulin ppd 1 or 2 when patient has reestablished a normal diet and her post partum insulin requirements are evaluated.

## 2012-01-07 NOTE — Progress Notes (Addendum)
Patient is eating, ambulating, voiding.  Pain control is good.  Filed Vitals:   01/07/12 0002 01/07/12 0100 01/07/12 0205 01/07/12 0524  BP: 161/98 164/96 144/79 135/90  Pulse: 99 110 95 114  Temp:  98.8 F (37.1 C) 99.2 F (37.3 C) 98.4 F (36.9 C)  TempSrc:  Oral Oral Oral  Resp: 18 18 18 18   Height:      Weight:      SpO2:  98% 100%     Fundus firm Perineum without swelling.  Lab Results  Component Value Date   WBC 12.4* 01/07/2012   HGB 12.0 01/07/2012   HCT 33.7* 01/07/2012   MCV 82.4 01/07/2012   PLT 232 01/07/2012    --/--/O POS (12/18 2142)/RI  A/P Post partum day 1.  Pt's BPs are stabilizing.  Pt's blood glucose today was 399-  Received insulin sliding scale.  Eating full diet - will restart Levamir 32 units AM/26 units PM.  Routine care.  Expect d/c tomorrow.    Reighan Hipolito A

## 2012-01-07 NOTE — Progress Notes (Signed)
0100  Pt requested custom insulin scale and Levimir long acting insulin.  Dierdre Searles MD.  Orders given to discontinue Motrin due to correlation with high blood pressures and agreed to have patient use personal customized sliding scale.  Arlyce Dice will wait to order Levimir until patient is eating and post partum insulin requirements are evaluated.  Dahlia Byes Boschen

## 2012-01-07 NOTE — Anesthesia Postprocedure Evaluation (Signed)
   Anesthesia Post-op Note  [name]  Procedure(s) Performed: * No procedures listed *  Patient Location: Mother/Baby  Anesthesia Type:Epidural  Level of Consciousness: awake, alert  and oriented  Airway and Oxygen Therapy: Patient Spontanous Breathing  Post-op Pain: mild  Post-op Assessment: Post-op Vital signs reviewed, Patient's Cardiovascular Status Stable, No headache, No backache, No residual numbness and No residual motor weakness  Post-op Vital Signs: Reviewed and stable  Complications: No apparent anesthesia complications

## 2012-01-07 NOTE — Progress Notes (Signed)
Patient states she takes sliding scale insulin as ordered according to her blood sugar and then adds a meal coverage insulin to it according to what she eats.  Notified Dr. Henderson Cloud in order to add order to [facility].  Dr. Henderson Cloud stated that we may include the meal coverage dose with the sliding scale and have the patient determine her dosage according to her home regimen, then chart along with sliding scale.  Patient also states that she takes levimir at breakfast and at bedtime and takes blood sugars achs and 2 hours postprandial.  Dr. Henderson Cloud ordered that we may adjust orders according to how patient monitors at home.  Will continue to monitor closely. Earl Gala, Linda Hedges Anderson

## 2012-01-08 LAB — COMPREHENSIVE METABOLIC PANEL
ALT: 14 U/L (ref 0–35)
Alkaline Phosphatase: 160 U/L — ABNORMAL HIGH (ref 39–117)
BUN: 8 mg/dL (ref 6–23)
CO2: 21 mEq/L (ref 19–32)
GFR calc Af Amer: >90 mL/min (ref >90)
GFR calc non Af Amer: >90 mL/min (ref >90)
Glucose, Bld: 124 mg/dL — ABNORMAL HIGH (ref 70–99)
Potassium: 3.6 mEq/L (ref 3.5–5.1)
Sodium: 134 mEq/L — ABNORMAL LOW (ref 135–145)

## 2012-01-08 LAB — GLUCOSE, CAPILLARY: Glucose-Capillary: 111 mg/dL — ABNORMAL HIGH (ref 70–99)

## 2012-01-08 LAB — CBC
HCT: 31.7 % — ABNORMAL LOW (ref 36.0–46.0)
Hemoglobin: 11.1 g/dL — ABNORMAL LOW (ref 12.0–15.0)
MCH: 28.8 pg (ref 26.0–34.0)
RBC: 3.85 MIL/uL — ABNORMAL LOW (ref 3.87–5.11)

## 2012-01-08 MED ORDER — INSULIN ASPART 100 UNIT/ML ~~LOC~~ SOLN
2.0000 [IU] | Freq: Once | SUBCUTANEOUS | Status: AC
  Administered 2012-01-08: 2 [IU] via SUBCUTANEOUS

## 2012-01-08 MED ORDER — LABETALOL HCL 200 MG PO TABS
200.0000 mg | ORAL_TABLET | Freq: Three times a day (TID) | ORAL | Status: DC
  Administered 2012-01-08 – 2012-01-09 (×4): 200 mg via ORAL
  Filled 2012-01-08 (×7): qty 1

## 2012-01-08 MED ORDER — INSULIN ASPART 100 UNIT/ML ~~LOC~~ SOLN
4.0000 [IU] | Freq: Once | SUBCUTANEOUS | Status: AC
  Administered 2012-01-08: 4 [IU] via SUBCUTANEOUS

## 2012-01-08 NOTE — Progress Notes (Signed)
Pt started on Labetalol today.  1st dose given at 10:00am.  Pt's BP remains stable at 15:30  - 130/87.  Phoned Dr. Dareen Piano with BP value.  He stated he wants patient monitored for another night and will reevaluate for d/c in the AM.

## 2012-01-08 NOTE — Progress Notes (Signed)
PPD#2 Pt without complaints. Has had elevated B/P's in the last 48 hours. No HA. Baby still on double bili lights. ABD/ non tender. IMP/ HTN PLAN/ Will  Check CMET, CBC.             Start Labetalol

## 2012-01-09 LAB — GLUCOSE, CAPILLARY
Glucose-Capillary: 109 mg/dL — ABNORMAL HIGH (ref 70–99)
Glucose-Capillary: 126 mg/dL — ABNORMAL HIGH (ref 70–99)
Glucose-Capillary: 33 mg/dL — CL (ref 70–99)
Glucose-Capillary: 65 mg/dL — ABNORMAL LOW (ref 70–99)
Glucose-Capillary: 86 mg/dL (ref 70–99)

## 2012-01-09 MED ORDER — OXYCODONE-ACETAMINOPHEN 5-325 MG PO TABS: 1.0000 | ORAL_TABLET | ORAL | Status: DC | PRN

## 2012-01-09 MED ORDER — LABETALOL HCL 200 MG PO TABS: 200.0000 mg | ORAL_TABLET | Freq: Three times a day (TID) | ORAL | Status: DC

## 2012-01-09 NOTE — Discharge Summary (Signed)
Obstetric Discharge Summary Reason for Admission: onset of labor Prenatal Procedures: ultrasound and diabetes management Intrapartum Procedures: vacuum Postpartum Procedures: none Complications-Operative and Postpartum: 2  degree perineal laceration and hypertension Hemoglobin  Date Value Range Status  01/08/2012 11.1* 12.0 - 15.0 g/dL Final     HCT  Date Value Range Status  01/08/2012 31.7* 36.0 - 46.0 % Final    Physical Exam:  General: alert Lochia: appropriate Uterine Fundus: firm  Discharge Diagnoses: Premature labor and insulin dependent diabetes and hypertension  Discharge Information: Date: 01/09/2012 Activity: pelvic rest Diet: routine Medications: PNV, Percocet and labetalol and insulin Condition: stable Instructions: refer to practice specific booklet Discharge to: home Follow-up Information    Follow up with Mickel Baas, MD. Schedule an appointment as soon as possible for a visit in 5 days.   Contact information:   [address] [place] [address] [phone]          Newborn Data: Live born female  Birth Weight: 7 lb 7.2 oz (3379 g) APGAR: 6, 9  Home with mother.  Arvella Massingale E 01/09/2012, 10:43 AM

## 2012-01-09 NOTE — Progress Notes (Signed)
Pt without complaints. B/Ps improved. Baby's bili improved. IMP/ Improved PLAN/ Will discharge to home and follow up in office Thurs. She has restarted her previous insulin.

## 2012-06-29 ENCOUNTER — Inpatient Hospital Stay (HOSPITAL_COMMUNITY)
Admission: EM | Admit: 2012-06-29 | Discharge: 2012-07-01 | DRG: 295 | Disposition: A | Discharge status: Home / Self Care | Payer: BC Managed Care – PPO | Attending: Family Medicine | Admitting: Family Medicine

## 2012-06-29 ENCOUNTER — Emergency Department (HOSPITAL_COMMUNITY): Payer: BC Managed Care – PPO

## 2012-06-29 ENCOUNTER — Encounter (HOSPITAL_COMMUNITY): Payer: Self-pay | Admitting: Emergency Medicine

## 2012-06-29 DIAGNOSIS — I10 Essential (primary) hypertension: Secondary | ICD-10-CM | POA: Diagnosis present

## 2012-06-29 DIAGNOSIS — J45909 Unspecified asthma, uncomplicated: Secondary | ICD-10-CM | POA: Diagnosis present

## 2012-06-29 DIAGNOSIS — E1065 Type 1 diabetes mellitus with hyperglycemia: Secondary | ICD-10-CM

## 2012-06-29 DIAGNOSIS — IMO0002 Reserved for concepts with insufficient information to code with codable children: Secondary | ICD-10-CM | POA: Diagnosis present

## 2012-06-29 DIAGNOSIS — N390 Urinary tract infection, site not specified: Secondary | ICD-10-CM | POA: Diagnosis present

## 2012-06-29 DIAGNOSIS — E109 Type 1 diabetes mellitus without complications: Secondary | ICD-10-CM

## 2012-06-29 DIAGNOSIS — E1110 Type 2 diabetes mellitus with ketoacidosis without coma: Secondary | ICD-10-CM | POA: Diagnosis present

## 2012-06-29 DIAGNOSIS — R0600 Dyspnea, unspecified: Secondary | ICD-10-CM

## 2012-06-29 DIAGNOSIS — E049 Nontoxic goiter, unspecified: Secondary | ICD-10-CM

## 2012-06-29 DIAGNOSIS — E1010 Type 1 diabetes mellitus with ketoacidosis without coma: Principal | ICD-10-CM | POA: Diagnosis present

## 2012-06-29 LAB — GLUCOSE, CAPILLARY
Glucose-Capillary: >600 mg/dL (ref 70–99)
Glucose-Capillary: 383 mg/dL — ABNORMAL HIGH (ref 70–99)
Glucose-Capillary: 276 mg/dL — ABNORMAL HIGH (ref 70–99)
Glucose-Capillary: 177 mg/dL — ABNORMAL HIGH (ref 70–99)
Glucose-Capillary: 192 mg/dL — ABNORMAL HIGH (ref 70–99)
Glucose-Capillary: 132 mg/dL — ABNORMAL HIGH (ref 70–99)
Glucose-Capillary: 157 mg/dL — ABNORMAL HIGH (ref 70–99)

## 2012-06-29 LAB — BASIC METABOLIC PANEL
BUN: 10 mg/dL (ref 6–23)
BUN: 9 mg/dL (ref 6–23)
BUN: 10 mg/dL (ref 6–23)
CO2: 10 mEq/L — CL (ref 19–32)
CO2: 16 mEq/L — ABNORMAL LOW (ref 19–32)
CO2: 19 mEq/L (ref 19–32)
Calcium: 8.3 mg/dL — ABNORMAL LOW (ref 8.4–10.5)
Calcium: 8.1 mg/dL — ABNORMAL LOW (ref 8.4–10.5)
Calcium: 8 mg/dL — ABNORMAL LOW (ref 8.4–10.5)
Calcium: 8 mg/dL — ABNORMAL LOW (ref 8.4–10.5)
Chloride: 104 mEq/L (ref 96–112)
Chloride: 105 mEq/L (ref 96–112)
Creatinine, Ser: 0.63 mg/dL (ref 0.50–1.10)
Creatinine, Ser: 0.53 mg/dL (ref 0.50–1.10)
Creatinine, Ser: 0.56 mg/dL (ref 0.50–1.10)
Creatinine, Ser: 0.57 mg/dL (ref 0.50–1.10)
GFR calc Af Amer: >90 mL/min (ref >90)
GFR calc Af Amer: >90 mL/min (ref >90)
GFR calc Af Amer: >90 mL/min (ref >90)
GFR calc Af Amer: >90 mL/min (ref >90)
GFR calc non Af Amer: >90 mL/min (ref >90)
GFR calc non Af Amer: >90 mL/min (ref >90)
GFR calc non Af Amer: >90 mL/min (ref >90)
GFR calc non Af Amer: >90 mL/min (ref >90)
GFR calc non Af Amer: >90 mL/min (ref >90)
Glucose, Bld: 210 mg/dL — ABNORMAL HIGH (ref 70–99)
Glucose, Bld: 228 mg/dL — ABNORMAL HIGH (ref 70–99)
Glucose, Bld: 141 mg/dL — ABNORMAL HIGH (ref 70–99)
Potassium: 4.6 mEq/L (ref 3.5–5.1)
Potassium: 4.5 mEq/L (ref 3.5–5.1)
Potassium: 3.7 mEq/L (ref 3.5–5.1)
Potassium: 3.4 mEq/L — ABNORMAL LOW (ref 3.5–5.1)
Sodium: 139 mEq/L (ref 135–145)
Sodium: 138 mEq/L (ref 135–145)
Sodium: 136 mEq/L (ref 135–145)
Sodium: 134 mEq/L — ABNORMAL LOW (ref 135–145)

## 2012-06-29 LAB — POCT I-STAT, CHEM 8
BUN: 16 mg/dL (ref 6–23)
Calcium, Ion: 1.16 mmol/L (ref 1.12–1.23)
Chloride: 106 mEq/L (ref 96–112)
Creatinine, Ser: 0.7 mg/dL (ref 0.50–1.10)
Glucose, Bld: 624 mg/dL (ref 70–99)
TCO2: 14 mmol/L (ref 0–100)

## 2012-06-29 LAB — COMPREHENSIVE METABOLIC PANEL
ALT: 77 U/L — ABNORMAL HIGH (ref 0–35)
CO2: 12 mEq/L — ABNORMAL LOW (ref 19–32)
Calcium: 9.1 mg/dL (ref 8.4–10.5)
Chloride: 94 mEq/L — ABNORMAL LOW (ref 96–112)
Creatinine, Ser: 0.66 mg/dL (ref 0.50–1.10)
GFR calc Af Amer: >90 mL/min (ref >90)
GFR calc non Af Amer: >90 mL/min (ref >90)
Glucose, Bld: 660 mg/dL (ref 70–99)
Sodium: 134 mEq/L — ABNORMAL LOW (ref 135–145)
Total Bilirubin: 0.7 mg/dL (ref 0.3–1.2)

## 2012-06-29 LAB — URINE MICROSCOPIC-ADD ON

## 2012-06-29 LAB — CBC WITH DIFFERENTIAL/PLATELET
Eosinophils Relative: 0 % (ref 0–5)
HCT: 41 % (ref 36.0–46.0)
Lymphocytes Relative: 10 % — ABNORMAL LOW (ref 12–46)
Lymphs Abs: 1.8 10*3/uL (ref 0.7–4.0)
MCV: 93 fL (ref 78.0–100.0)
Monocytes Absolute: 0.8 10*3/uL (ref 0.1–1.0)
RBC: 4.41 MIL/uL (ref 3.87–5.11)
WBC: 17.9 10*3/uL — ABNORMAL HIGH (ref 4.0–10.5)

## 2012-06-29 LAB — BLOOD GAS, VENOUS
Acid-base deficit: 17 mmol/L — ABNORMAL HIGH (ref 0.0–2.0)
Patient temperature: 98.6
pCO2, Ven: 37.9 mmHg — ABNORMAL LOW (ref 45.0–50.0)

## 2012-06-29 LAB — URINALYSIS, ROUTINE W REFLEX MICROSCOPIC
Bilirubin Urine: NEGATIVE
Ketones, ur: >80 mg/dL — AB
Nitrite: NEGATIVE
Protein, ur: NEGATIVE mg/dL
Specific Gravity, Urine: 1.032 — ABNORMAL HIGH (ref 1.005–1.030)
Urobilinogen, UA: 0.2 mg/dL (ref 0.0–1.0)

## 2012-06-29 LAB — MRSA PCR SCREENING: MRSA by PCR: NEGATIVE

## 2012-06-29 MED ORDER — BUDESONIDE-FORMOTEROL FUMARATE 160-4.5 MCG/ACT IN AERO
2.0000 | INHALATION_SPRAY | Freq: Two times a day (BID) | RESPIRATORY_TRACT | Status: DC
  Administered 2012-06-29 – 2012-07-01 (×4): 2 via RESPIRATORY_TRACT
  Filled 2012-06-29: qty 6

## 2012-06-29 MED ORDER — MORPHINE SULFATE 4 MG/ML IJ SOLN
4.0000 mg | Freq: Once | INTRAMUSCULAR | Status: AC
  Administered 2012-06-29: 4 mg via INTRAVENOUS
  Filled 2012-06-29: qty 1

## 2012-06-29 MED ORDER — ONDANSETRON HCL 4 MG/2ML IJ SOLN: 4.0000 mg | Freq: Four times a day (QID) | INTRAMUSCULAR | Status: DC | PRN

## 2012-06-29 MED ORDER — SODIUM CHLORIDE 0.9 % IV SOLN
INTRAVENOUS | Status: DC
  Administered 2012-06-29: 5.8 [IU]/h via INTRAVENOUS
  Filled 2012-06-29: qty 1

## 2012-06-29 MED ORDER — ONDANSETRON HCL 4 MG/2ML IJ SOLN
4.0000 mg | Freq: Once | INTRAMUSCULAR | Status: AC
  Administered 2012-06-29: 4 mg via INTRAVENOUS
  Filled 2012-06-29: qty 2

## 2012-06-29 MED ORDER — SODIUM CHLORIDE 0.9 % IJ SOLN: 3.0000 mL | Freq: Two times a day (BID) | INTRAMUSCULAR | Status: DC

## 2012-06-29 MED ORDER — DEXTROSE-NACL 5-0.45 % IV SOLN
INTRAVENOUS | Status: DC
  Administered 2012-06-29: 17:00:00 via INTRAVENOUS

## 2012-06-29 MED ORDER — LORATADINE 10 MG PO TABS
10.0000 mg | ORAL_TABLET | Freq: Every day | ORAL | Status: DC
  Administered 2012-06-29 – 2012-07-01 (×3): 10 mg via ORAL
  Filled 2012-06-29 (×3): qty 1

## 2012-06-29 MED ORDER — ONDANSETRON HCL 4 MG PO TABS: 4.0000 mg | ORAL_TABLET | Freq: Four times a day (QID) | ORAL | Status: DC | PRN

## 2012-06-29 MED ORDER — ONDANSETRON HCL 4 MG/2ML IJ SOLN: 4.0000 mg | Freq: Three times a day (TID) | INTRAMUSCULAR | Status: DC | PRN

## 2012-06-29 MED ORDER — FLUTICASONE PROPIONATE 50 MCG/ACT NA SUSP: 2.0000 | Freq: Every day | NASAL | Status: DC | PRN

## 2012-06-29 MED ORDER — SODIUM CHLORIDE 0.9 % IV BOLUS (SEPSIS)
1000.0000 mL | Freq: Once | INTRAVENOUS | Status: AC
  Administered 2012-06-29: 1000 mL via INTRAVENOUS

## 2012-06-29 MED ORDER — SODIUM CHLORIDE 0.9 % IV SOLN
INTRAVENOUS | Status: DC
  Administered 2012-06-29: 5.1 [IU]/h via INTRAVENOUS
  Administered 2012-06-29: 4.2 [IU]/h via INTRAVENOUS
  Filled 2012-06-29: qty 1

## 2012-06-29 MED ORDER — HEPARIN SODIUM (PORCINE) 5000 UNIT/ML IJ SOLN
5000.0000 [IU] | Freq: Three times a day (TID) | INTRAMUSCULAR | Status: DC
  Administered 2012-06-29 – 2012-07-01 (×5): 5000 [IU] via SUBCUTANEOUS
  Filled 2012-06-29 (×8): qty 1

## 2012-06-29 MED ORDER — DEXTROSE 50 % IV SOLN: 25.0000 mL | INTRAVENOUS | Status: DC | PRN

## 2012-06-29 MED ORDER — CEFTRIAXONE SODIUM 1 G IJ SOLR
1.0000 g | INTRAMUSCULAR | Status: DC
  Administered 2012-06-29 – 2012-06-30 (×2): 1 g via INTRAVENOUS
  Filled 2012-06-29 (×4): qty 10

## 2012-06-29 MED ORDER — INSULIN REGULAR BOLUS VIA INFUSION
0.0000 [IU] | Freq: Three times a day (TID) | INTRAVENOUS | Status: DC
  Filled 2012-06-29: qty 10

## 2012-06-29 MED ORDER — ALBUTEROL SULFATE HFA 108 (90 BASE) MCG/ACT IN AERS: 2.0000 | INHALATION_SPRAY | Freq: Four times a day (QID) | RESPIRATORY_TRACT | Status: DC | PRN

## 2012-06-29 MED ORDER — SODIUM CHLORIDE 0.9 % IV SOLN: INTRAVENOUS | Status: DC

## 2012-06-29 MED ORDER — SODIUM CHLORIDE 0.9 % IV SOLN
INTRAVENOUS | Status: DC
  Administered 2012-06-29: 15:00:00 via INTRAVENOUS

## 2012-06-29 NOTE — ED Notes (Signed)
JYN:[id number]<AO> Expected date:<BR> Expected time:<BR> Means of arrival:<BR> Comments:<BR> Waiting for transportation

## 2012-06-29 NOTE — ED Notes (Addendum)
MD at bedside. 

## 2012-06-29 NOTE — ED Notes (Signed)
Per EMS, Pt, from home, c/o hyperglycemia and n/v, starting this morning.  Denies pain.  Sts her CBG this morning was 120, but CBG was 579 in route.  Vitals stable.  4mg  Zofran given in route.

## 2012-06-29 NOTE — ED Notes (Signed)
ZOX:WRUE<AV> Expected date:<BR> Expected time:<BR> Means of arrival:<BR> Comments:<BR> Hyperglycemic 519

## 2012-06-29 NOTE — ED Provider Notes (Signed)
History     CSN: [id number]  Arrival date & time 06/29/12  1011   First MD Initiated Contact with Patient 06/29/12 1014      Chief Complaint  Patient presents with  . Hyperglycemia  . Emesis    (Consider location/radiation/quality/duration/timing/severity/associated sxs/prior treatment) HPI Comments: Patient presents by EMS with hyperglycemia, nausea and vomiting. She states her sugar was 120 this morning it was 579 for EMS. She reports waking up with multiple episodes of nonbilious emesis with red streaks. Denies any abdominal pain or diarrhea. Denies any fever. She also felt unwell 2 days ago and had 2 episodes of vomiting then. 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 She also states she has been outside in the sun a lot. Denies cough, congestion, chest pain, SOB. She was able to eat and drink normally yesterday.   The history is provided by the patient.    Past Medical History  Diagnosis Date  . Diabetes mellitus   . Asthma   . Hypertension   . IONGEXBM(841.3)     Past Surgical History  Procedure Laterality Date  . Wisdom tooth extraction      Family History  Problem Relation Age of Onset  . Diabetes Mother   . Hyperlipidemia Mother   . Hypertension Father   . Heart disease Father   . Heart disease Maternal Grandmother   . Heart disease Maternal Grandfather   . Hypertension Paternal Grandmother   . Cancer Paternal Grandfather     History  Substance Use Topics  . Smoking status: Never Smoker   . Smokeless tobacco: Never Used  . Alcohol Use: No    OB History   Grav Para Term Preterm Abortions TAB SAB Ect Mult Living   2 1 0 1 1 1 0 0 0 1       Review of Systems  Constitutional: Positive for activity change and fatigue. Negative for fever.  HENT: Negative for congestion and rhinorrhea.   Respiratory: Negative for cough, chest tightness and shortness of breath.   Cardiovascular: Negative for chest pain.  Gastrointestinal:  Positive for nausea, vomiting and abdominal pain. Negative for diarrhea.  Genitourinary: Negative for dysuria and hematuria.  Musculoskeletal: Positive for myalgias and arthralgias. Negative for back pain.  Skin: Negative for rash.  Neurological: Positive for weakness. Negative for dizziness and headaches.  A complete 10 system review of systems was obtained and all systems are negative except as noted in the HPI and PMH.    Allergies  Peanut-containing drug products and Strawberry  Home Medications   No current outpatient prescriptions on file.  BP 121/81  Pulse 103  Temp(Src) 97.9 F (36.6 C) (Oral)  Resp 22  Ht 5\' 4"  (1.626 m)  Wt 119 lb 9.6 oz (54.25 kg)  BMI 20.52 kg/m2  SpO2 100%  LMP 06/26/2012  Breastfeeding? No  Physical Exam  Constitutional: She is oriented to person, place, and time. She appears well-developed and well-nourished. No distress.  HENT:  Head: Normocephalic and atraumatic.  Mouth/Throat: Oropharynx is clear and moist.  Dry mucus membranes  Eyes: Conjunctivae and EOM are normal. Pupils are equal, round, and reactive to light.  Neck: Normal range of motion. Neck supple.  Cardiovascular: Regular rhythm and normal heart sounds.   No murmur heard. tachycardia  Pulmonary/Chest: Effort normal and breath sounds normal. No respiratory distress. She exhibits no tenderness.  Abdominal: Soft. There is no tenderness. There is no rebound and no guarding.  Musculoskeletal: Normal  range of motion. She exhibits no edema and no tenderness.  Neurological: She is alert and oriented to person, place, and time. No cranial nerve deficit. She exhibits normal muscle tone. Coordination normal.  Skin: Skin is warm.    ED Course  Procedures (including critical care time)  Labs Reviewed  CBC WITH DIFFERENTIAL - Abnormal; Notable for the following:    WBC 17.9 (*)    Platelets 415 (*)    Neutrophils Relative % 86 (*)    Neutro Abs 15.3 (*)    Lymphocytes Relative 10  (*)    All other components within normal limits  COMPREHENSIVE METABOLIC PANEL - Abnormal; Notable for the following:    Sodium 134 (*)    Chloride 94 (*)    CO2 12 (*)    Glucose, Bld 660 (*)    AST 181 (*)    ALT 77 (*)    Alkaline Phosphatase 155 (*)    All other components within normal limits  KETONES, QUALITATIVE - Abnormal; Notable for the following:    Acetone, Bld LARGE (*)    All other components within normal limits  BLOOD GAS, VENOUS - Abnormal; Notable for the following:    pH, Ven 7.120 (*)    pCO2, Ven 37.9 (*)    Bicarbonate 11.8 (*)    Acid-base deficit 17.0 (*)    All other components within normal limits  URINALYSIS, ROUTINE W REFLEX MICROSCOPIC - Abnormal; Notable for the following:    APPearance CLOUDY (*)    Specific Gravity, Urine 1.032 (*)    Glucose, UA >1000 (*)    Hgb urine dipstick LARGE (*)    Ketones, ur >80 (*)    Leukocytes, UA SMALL (*)    All other components within normal limits  GLUCOSE, CAPILLARY - Abnormal; Notable for the following:    Glucose-Capillary >600 (*)    All other components within normal limits  URINE MICROSCOPIC-ADD ON - Abnormal; Notable for the following:    Squamous Epithelial / LPF FEW (*)    Bacteria, UA FEW (*)    All other components within normal limits  GLUCOSE, CAPILLARY - Abnormal; Notable for the following:    Glucose-Capillary 574 (*)    All other components within normal limits  GLUCOSE, CAPILLARY - Abnormal; Notable for the following:    Glucose-Capillary 482 (*)    All other components within normal limits  BASIC METABOLIC PANEL - Abnormal; Notable for the following:    CO2 10 (*)    Glucose, Bld 308 (*)    Calcium 8.3 (*)    All other components within normal limits  BASIC METABOLIC PANEL - Abnormal; Notable for the following:    CO2 12 (*)    Glucose, Bld 210 (*)    Calcium 8.1 (*)    All other components within normal limits  GLUCOSE, CAPILLARY - Abnormal; Notable for the following:     Glucose-Capillary 383 (*)    All other components within normal limits  GLUCOSE, CAPILLARY - Abnormal; Notable for the following:    Glucose-Capillary 276 (*)    All other components within normal limits  GLUCOSE, CAPILLARY - Abnormal; Notable for the following:    Glucose-Capillary 224 (*)    All other components within normal limits  POCT I-STAT, CHEM 8 - Abnormal; Notable for the following:    Glucose, Bld 624 (*)    Hemoglobin 15.3 (*)    All other components within normal limits  URINE CULTURE  URINE CULTURE  MRSA PCR SCREENING  PREGNANCY, URINE  BASIC METABOLIC PANEL  BASIC METABOLIC PANEL  BASIC METABOLIC PANEL  BASIC METABOLIC PANEL  BASIC METABOLIC PANEL  BASIC METABOLIC PANEL   Dg Chest 2 View  06/29/2012   *RADIOLOGY REPORT*  Clinical Data: Asthma, diabetes, vomiting  CHEST - 2 VIEW  Comparison:  11/01/2007  Findings:  The heart size and mediastinal contours are within normal limits.  Both lungs are clear.  The visualized skeletal structures are unremarkable.  IMPRESSION: No active cardiopulmonary disease.   Original Report Authenticated By: Judie Petit. Miles Costain, M.D.     1. DKA (diabetic ketoacidoses)   2. DM type 1 (diabetes mellitus, type 1)   3. Hypertension   4. Type I (juvenile type) diabetes mellitus without mention of complication, uncontrolled   5. UTI (lower urinary tract infection)   6. Asthma, unspecified asthma severity, uncomplicated       MDM  Nausea, vomiting, abdominal pain with hyperglycemia, rule out DKA.  Tachycardic, afebrile. Dry mucus membranes  High gap metabolic acidosis on labs. Anion gap 28 with large ketones. Aggressive IVF hydration and insulin gtt for DKA.  Patient states compliance with meds. Possible dehydration from sun exposure, possible UTI.  CRITICAL CARE Performed by: Glynn Octave Total critical care time: 30 Critical care time was exclusive of separately billable procedures and treating other patients. Critical care was  necessary to treat or prevent imminent or life-threatening deterioration. Critical care was time spent personally by me on the following activities: development of treatment plan with patient and/or surrogate as well as nursing, discussions with consultants, evaluation of patient's response to treatment, examination of patient, obtaining history from patient or surrogate, ordering and performing treatments and interventions, ordering and review of laboratory studies, ordering and review of radiographic studies, pulse oximetry and re-evaluation of patient's condition.         Glynn Octave, MD 06/29/12 [phone]

## 2012-06-29 NOTE — Progress Notes (Signed)
Utilization Review completed.  Calvary Difranco RN CM  

## 2012-06-29 NOTE — H&P (Signed)
[facility] History and Physical  [name] ZOX:[id number] DOB: 05-Dec-1990 DOA: 06/29/2012  Referring physician: Dr. Manus Gunning PCP: No primary provider on file.  Specialists: none  Chief Complaint: emesis and hyperglycemia  HPI: [name] is a 22 y.o. female  With history of DM type I, HTN, Asthma.  Presented to the ED complaining of multiple episodes of non bilious emesis.  She denies any recent illnesses and states that she has been out in the sun a lot and at times this has in the past contributed to her developing dehydration and subsequent nausea and emesis.  She reports sick contacts but denies any fevers, chills, sob, cough, or dysuria.   In the ED was found to have an elevated blood sugar level of 579 per ems and 660 on BMP, Patient has blood gas which showed PH of 7.12 with CO2 of 12 on BMP.  We were consulted for admission evaluation and recommendations given DKA.  Review of Systems: 10 point review of system reviewed and negative unless otherwise mentioned above.  Past Medical History  Diagnosis Date  . Diabetes mellitus   . Asthma   . Hypertension   . [id number](295.6)    Past Surgical History  Procedure Laterality Date  . Wisdom tooth extraction     Social History:  reports that she has never smoked. She has never used smokeless tobacco. She reports that she does not drink alcohol or use illicit drugs. Lives at home  Can patient participate in ADLs?yes  Allergies  Allergen Reactions  . Peanut-Containing Drug Products Swelling    Swelling of mouth, lips  . Strawberry Swelling    Swelling of mouth, lips    Family History  Problem Relation Age of Onset  . Diabetes Mother   . Hyperlipidemia Mother   . Hypertension Father   . Heart disease Father   . Heart disease Maternal Grandmother   . Heart disease Maternal Grandfather   . Hypertension Paternal Grandmother   . Cancer Paternal Grandfather     Prior to Admission medications   Medication  Sig Start Date End Date Taking? Authorizing Provider  albuterol (PROVENTIL HFA;VENTOLIN HFA) 108 (90 BASE) MCG/ACT inhaler Inhale 2 puffs into the lungs every 6 (six) hours as needed for shortness of breath.   Yes Historical Provider, MD  albuterol (PROVENTIL) (2.5 MG/3ML) 0.083% nebulizer solution Take 2.5 mg by nebulization every 6 (six) hours as needed for shortness of breath.   Yes Historical Provider, MD  budesonide-formoterol (SYMBICORT) 160-4.5 MCG/ACT inhaler Inhale 2 puffs into the lungs 2 (two) times daily.   Yes Historical Provider, MD  fluticasone (FLONASE) 50 MCG/ACT nasal spray Place 2 sprays into the nose daily as needed for rhinitis or allergies.    Yes Historical Provider, MD  ibuprofen (ADVIL,MOTRIN) 200 MG tablet Take 400 mg by mouth every 6 (six) hours as needed for pain.   Yes Historical Provider, MD  insulin aspart (NOVOLOG FLEXPEN) 100 UNIT/ML injection Inject 0-20 Units into the skin as directed. SSI - carb scale, 0-20 units   Yes Historical Provider, MD  insulin glargine (LANTUS) 100 UNIT/ML injection Inject into the skin 2 (two) times daily. 26 units in the morning, 36 units at night   Yes Historical Provider, MD  loratadine (CLARITIN) 10 MG tablet Take 10 mg by mouth daily.   Yes Historical Provider, MD  Probiotic Product (PROBIOTIC DAILY PO) Take 1 capsule by mouth daily.   Yes Historical Provider, MD   Physical Exam: Filed Vitals:   06/29/12  1028 06/29/12 1153 06/29/12 1200  BP: 109/76 113/55 115/68  Pulse: 118 118 117  Temp: 97.8 F (36.6 C)    TempSrc: Oral    Resp:  24 21  SpO2: 99% 100% 100%     General:  Pt alert and awake, in NAD, non toxic appearing  Eyes: non icteric, EOMI  ENT: normal exterior appearance, dry Mucous membranes  Neck: supple, no goiter  Cardiovascular: RRR, no MRG  Respiratory: CTA BL, no wheezes  Abdomen: soft, Nt, ND  Skin: warm and dry no obvious skin rashes  Musculoskeletal: no cyanosis or clubbing  Psychiatric: mood  and affect appropriate  Neurologic: answers questions appropriately and moves all extremities.  Labs on Admission:  Basic Metabolic Panel:  Recent Labs Lab 06/29/12 1040 06/29/12 1048  NA 134* 135  K 4.6 4.7  CL 94* 106  CO2 12*  --   GLUCOSE 660* 624*  BUN 16 16  CREATININE 0.66 0.70  CALCIUM 9.1  --    Liver Function Tests:  Recent Labs Lab 06/29/12 1040  AST 181*  ALT 77*  ALKPHOS 155*  BILITOT 0.7  PROT 7.7  ALBUMIN 3.9   No results found for this basename: LIPASE, AMYLASE,  in the last 168 hours No results found for this basename: AMMONIA,  in the last 168 hours CBC:  Recent Labs Lab 06/29/12 1040 06/29/12 1048  WBC 17.9*  --   NEUTROABS 15.3*  --   HGB 13.5 15.3*  HCT 41.0 45.0  MCV 93.0  --   PLT 415*  --    Cardiac Enzymes: No results found for this basename: CKTOTAL, CKMB, CKMBINDEX, TROPONINI,  in the last 168 hours  BNP (last 3 results) No results found for this basename: PROBNP,  in the last 8760 hours CBG:  Recent Labs Lab 06/29/12 1039 06/29/12 1146 06/29/12 1249  GLUCAP >600* 574* 482*    Radiological Exams on Admission: No results found.  Assessment/Plan Active Problems:   1. DKA - Will place on step down unit for closer monitoring - Insulin gtt per glucostabilizer protocol - question whether it was 2ary to uti. Patient asymptomatic - BMP q 2 hours  2. DM - As indicated above once patient meets criteria will transition to insulin sq and diabetic diet.  3. UTI - questionable u/a.  Will place order for urine culture and reassess - patient is asymptomatic however has leukocytosis.  4. Asthma - stable, will continue home albuterol regimen   Code Status: full code Family Communication:  Discussed with patient and family at bedside. Disposition Plan:  Pending improvement in condition, for now stepdown.  Time spent: > 60 minutes  Penny Pia [facility] Pager [phone]  If 7PM-7AM, please contact  night-coverage [email or web address] Password [email or web address] 06/29/2012, 1:18 PM

## 2012-06-29 NOTE — ED Notes (Signed)
Notified Rancour, MD glucose 624.

## 2012-06-29 NOTE — Progress Notes (Signed)
Pharmacy Consult - Rocephin   21 yof presented 6/11 with DKA, UA c/w UTI - MD ordered Rocephin for UTI. Afebrile, WBC 17.9K.  Pending urine culture.   Plan: Rocephin 1gm IV q24h.  Pharmacy will sign off.  Geoffry Paradise, PharmD, BCPS Pager: [phone] 2:09 PM Pharmacy #: [id number]

## 2012-06-30 LAB — BASIC METABOLIC PANEL
BUN: 9 mg/dL (ref 6–23)
CO2: 21 mEq/L (ref 19–32)
Calcium: 8.3 mg/dL — ABNORMAL LOW (ref 8.4–10.5)
Calcium: 8.3 mg/dL — ABNORMAL LOW (ref 8.4–10.5)
Calcium: 8.4 mg/dL (ref 8.4–10.5)
Calcium: 8.5 mg/dL (ref 8.4–10.5)
Creatinine, Ser: 0.51 mg/dL (ref 0.50–1.10)
Creatinine, Ser: 0.52 mg/dL (ref 0.50–1.10)
Creatinine, Ser: 0.51 mg/dL (ref 0.50–1.10)
GFR calc Af Amer: >90 mL/min (ref >90)
GFR calc Af Amer: >90 mL/min (ref >90)
GFR calc non Af Amer: >90 mL/min (ref >90)
GFR calc non Af Amer: >90 mL/min (ref >90)
GFR calc non Af Amer: >90 mL/min (ref >90)
Glucose, Bld: 66 mg/dL — ABNORMAL LOW (ref 70–99)
Glucose, Bld: 58 mg/dL — ABNORMAL LOW (ref 70–99)
Sodium: 136 mEq/L (ref 135–145)

## 2012-06-30 LAB — GLUCOSE, CAPILLARY
Glucose-Capillary: 162 mg/dL — ABNORMAL HIGH (ref 70–99)
Glucose-Capillary: 72 mg/dL (ref 70–99)
Glucose-Capillary: 78 mg/dL (ref 70–99)
Glucose-Capillary: 56 mg/dL — ABNORMAL LOW (ref 70–99)
Glucose-Capillary: 72 mg/dL (ref 70–99)
Glucose-Capillary: 110 mg/dL — ABNORMAL HIGH (ref 70–99)
Glucose-Capillary: 316 mg/dL — ABNORMAL HIGH (ref 70–99)

## 2012-06-30 LAB — URINE CULTURE: Colony Count: 30000

## 2012-06-30 MED ORDER — INSULIN GLARGINE 100 UNIT/ML ~~LOC~~ SOLN
36.0000 [IU] | Freq: Every day | SUBCUTANEOUS | Status: DC
  Administered 2012-06-30 (×2): 36 [IU] via SUBCUTANEOUS
  Filled 2012-06-30 (×3): qty 0.36

## 2012-06-30 MED ORDER — SODIUM CHLORIDE 0.9 % IV SOLN
INTRAVENOUS | Status: DC
  Administered 2012-06-30 – 2012-07-01 (×3): via INTRAVENOUS

## 2012-06-30 MED ORDER — INSULIN ASPART 100 UNIT/ML ~~LOC~~ SOLN
0.0000 [IU] | Freq: Three times a day (TID) | SUBCUTANEOUS | Status: DC
  Administered 2012-06-30: 3 [IU] via SUBCUTANEOUS
  Administered 2012-06-30: 2 [IU] via SUBCUTANEOUS

## 2012-06-30 NOTE — Progress Notes (Signed)
[facility] PROGRESS NOTE  Khushbu Pippen LKG:[id number] DOB: 08/31/1990 DOA: 06/29/2012 PCP: No primary provider on file.  Assessment/Plan: 1. DKA - Patient initially placed in step down - Anion gap back to normal at 8. Therefore will stop BMP's q 2 hours - Patient has been transitioned to insulin SQ and this AM had some hypoglycemia which responded well to apple juice. - question whether it was precipitated by uti. Patient asymptomatic for uti though.  - Given reports of sinus tachycardia will transition patient to telemetry  2. DM  - Pt currently on lantus and SSI - continue diabetic  .  3. UTI  - questionable u/a. Urine culture Pending  - patient is asymptomatic however has leukocytosis.   4. Asthma  - stable, will continue home albuterol regimen   Code Status: full Family Communication: No family present at bedside. Disposition Plan: transition to floor   Consultants:  None  Procedures:  Was on insulin drip initially  Antibiotics:  Ceftriaxone   HPI/Subjective: Patient has no new complaints. No acute issues reported overnight.  Objective: Filed Vitals:   06/29/12 2000 06/30/12 0000 06/30/12 0408 06/30/12 0755  BP:  107/68 120/76 121/75  Pulse:  93 83 100  Temp: 98.9 F (37.2 C) 98.3 F (36.8 C) 98 F (36.7 C)   TempSrc: Oral Oral Oral   Resp:  17 15 19   Height:      Weight:      SpO2:  100% 100% 100%    Intake/Output Summary (Last 24 hours) at 06/30/12 0908 Last data filed at 06/30/12 0800  Gross per 24 hour  Intake 3327.74 ml  Output      0 ml  Net 3327.74 ml   Filed Weights   06/29/12 1340  Weight: 54.25 kg (119 lb 9.6 oz)    Exam:   General:  Pt in NAD, Alert and Awake  Cardiovascular: RRR, no MRG  Respiratory: CTA BL, no wheezes  Abdomen: soft, NT, ND  Musculoskeletal: no cyanosis or clubbing   Data Reviewed: Basic Metabolic Panel:  Recent Labs Lab 06/29/12 1947 06/29/12 2220 06/30/12 0343 06/30/12 0630  06/30/12 0745  NA 136 134* 135 136 135  K 3.7 3.4* 3.5 3.5 3.5  CL 104 105 107 108 106  CO2 16* 19 18* 20 21  GLUCOSE 228* 141* 82 66* 58*  BUN 9 10 9 9 9   CREATININE 0.56 0.57 0.51 0.53 0.52  CALCIUM 8.0* 8.0* 8.3* 8.3* 8.4   Liver Function Tests:  Recent Labs Lab 06/29/12 1040  AST 181*  ALT 77*  ALKPHOS 155*  BILITOT 0.7  PROT 7.7  ALBUMIN 3.9   No results found for this basename: LIPASE, AMYLASE,  in the last 168 hours No results found for this basename: AMMONIA,  in the last 168 hours CBC:  Recent Labs Lab 06/29/12 1040 06/29/12 1048  WBC 17.9*  --   NEUTROABS 15.3*  --   HGB 13.5 15.3*  HCT 41.0 45.0  MCV 93.0  --   PLT 415*  --    Cardiac Enzymes: No results found for this basename: CKTOTAL, CKMB, CKMBINDEX, TROPONINI,  in the last 168 hours BNP (last 3 results) No results found for this basename: PROBNP,  in the last 8760 hours CBG:  Recent Labs Lab 06/30/12 0200 06/30/12 0303 06/30/12 0406 06/30/12 0508 06/30/12 0732  GLUCAP 90 72 78 72 56*    Recent Results (from the past 240 hour(s))  MRSA PCR SCREENING     Status: None  Collection Time    06/29/12  2:52 PM      Result Value Range Status   MRSA by PCR NEGATIVE  NEGATIVE Final   Comment:            The GeneXpert MRSA Assay (FDA     approved for NASAL specimens     only), is one component of a     comprehensive MRSA colonization     surveillance program. It is not     intended to diagnose MRSA     infection nor to guide or     monitor treatment for     MRSA infections.     Studies: Dg Chest 2 View  06/29/2012   *RADIOLOGY REPORT*  Clinical Data: Asthma, diabetes, vomiting  CHEST - 2 VIEW  Comparison:  11/01/2007  Findings:  The heart size and mediastinal contours are within normal limits.  Both lungs are clear.  The visualized skeletal structures are unremarkable.  IMPRESSION: No active cardiopulmonary disease.   Original Report Authenticated By: Judie Petit. Shick, M.D.    Scheduled  Meds: . budesonide-formoterol  2 puff Inhalation BID  . cefTRIAXone (ROCEPHIN)  IV  1 g Intravenous Q24H  . heparin  5,000 Units Subcutaneous Q8H  . insulin aspart  0-9 Units Subcutaneous TID WC  . insulin glargine  36 Units Subcutaneous QHS  . loratadine  10 mg Oral Daily  . sodium chloride  3 mL Intravenous Q12H   Continuous Infusions: . sodium chloride 100 mL/hr at 06/30/12 0059    Principal Problem:   DKA (diabetic ketoacidoses) Active Problems:   Type I (juvenile type) diabetes mellitus without mention of complication, uncontrolled   Hypertension   Asthma   DM type 1 (diabetes mellitus, type 1)   UTI (lower urinary tract infection)    Time spent: > 35 minutes    [name]  [facility] Pager [phone] If 7PM-7AM, please contact night-coverage at [email or web address], password [facility] 06/30/2012, 9:08 AM  LOS: 1 day

## 2012-07-01 LAB — BASIC METABOLIC PANEL
Chloride: 106 mEq/L (ref 96–112)
Creatinine, Ser: 0.51 mg/dL (ref 0.50–1.10)
GFR calc Af Amer: >90 mL/min (ref >90)
Potassium: 3.6 mEq/L (ref 3.5–5.1)

## 2012-07-01 LAB — CBC
HCT: 32.3 % — ABNORMAL LOW (ref 36.0–46.0)
Hemoglobin: 10.8 g/dL — ABNORMAL LOW (ref 12.0–15.0)
MCH: 30.2 pg (ref 26.0–34.0)
MCHC: 33.4 g/dL (ref 30.0–36.0)

## 2012-07-01 LAB — GLUCOSE, CAPILLARY: Glucose-Capillary: 120 mg/dL — ABNORMAL HIGH (ref 70–99)

## 2012-07-01 MED ORDER — INSULIN GLARGINE 100 UNIT/ML ~~LOC~~ SOLN: 36.0000 [IU] | Freq: Every day | SUBCUTANEOUS | Status: DC

## 2012-07-01 NOTE — Care Management Note (Signed)
    Page 1 of 1   07/01/2012     1:03:26 PM   CARE MANAGEMENT NOTE 07/01/2012  Patient:  [name], [name]   Account Number:  [id number]  Date Initiated:  07/01/2012  Documentation initiated by:  Lanier Clam  Subjective/Objective Assessment:   ADMITTED W/DKA.     Action/Plan:   FROM HOME.   Anticipated DC Date:  07/01/2012   Anticipated DC Plan:  HOME/SELF CARE      DC Planning Services  CM consult      Choice offered to / List presented to:             Status of service:  Completed, signed off Medicare Important Message given?   (If response is "NO", the following Medicare IM given date fields will be blank) Date Medicare IM given:   Date Additional Medicare IM given:    Discharge Disposition:  HOME/SELF CARE  Per UR Regulation:  Reviewed for med. necessity/level of care/duration of stay  If discussed at Long Length of Stay Meetings, dates discussed:    Comments:  07/01/12 [facility] RN,BSN NCM [phone]

## 2012-07-01 NOTE — Discharge Summary (Signed)
Physician Discharge Summary  Armando Lauman ZOX:[id number] DOB: 07/06/1990 DOA: 06/29/2012  PCP: No primary provider on file.  Admit date: 06/29/2012 Discharge date: 07/01/2012  Time spent: > 35 minutes  Recommendations for Outpatient Follow-up:  1. Please be sure to follow up with blood sugars and adjust patient's insulin regimen pending values  Discharge Diagnoses:  Principal Problem:   DKA (diabetic ketoacidoses) Active Problems:   Type I (juvenile type) diabetes mellitus without mention of complication, uncontrolled   Hypertension   Asthma   DM type 1 (diabetes mellitus, type 1)   UTI (lower urinary tract infection)   Discharge Condition: stable  Diet recommendation: diabetic diet  Filed Weights   06/29/12 1340  Weight: 54.25 kg (119 lb 9.6 oz)    History of present illness:  22 y/o with h/o DM I that presented to the ED in DKA.  Hospital Course:  1. DKA - Patient initially placed in step down  - Anion gap back to normal after insulin gtt to 8.  - Patient has was transitioned to SQ insulin and blood sugars fluctuated from 120-316 on lantus 36 Units SQ qhs and novolog sliding scale. - Etiology uncertain thought to be possibly 2ary to UTI but urine culture did not grow > 100,000 cfu of any organism.   2. DM  - Pt currently on lantus and SSI  - continue diabetic diet - pt instructed to log her blood sugars and present to her pcp on post discharge pcp follow up. .  3. UTI  - urine culture results as mentioned above - will discontinue antibiotics  4. Asthma  - stable, will continue home albuterol regimen   Procedures:  Was initially on insulin gtt  Consultations:  Diabetic coordinator  Discharge Exam: Filed Vitals:   06/30/12 1949 06/30/12 2147 07/01/12 0534 07/01/12 0924  BP:  123/67 122/89   Pulse:  99 90   Temp:  98.4 F (36.9 C) 97.9 F (36.6 C)   TempSrc:  Oral Oral   Resp:  18 16   Height:      Weight:      SpO2: 99% 100% 100% 100%     General: Pt in NAD, Alert and Awake Cardiovascular: RRR, no MRG Respiratory: CTA BL, no wheezes   Discharge Instructions  Discharge Orders   Future Appointments Provider Department Dept Phone   08/08/2012 10:30 AM Reather Littler, MD [facility] [phone]   Future Orders Complete By Expires     Call MD for:  extreme fatigue  As directed     Call MD for:  persistant nausea and vomiting  As directed     Diet - low sodium heart healthy  As directed     Discharge instructions  As directed     Comments:      Please be sure to continue monitoring your blood sugars.  You will need to follow up with your pcp for further evaluation and recommendations regarding your diabetes.    Increase activity slowly  As directed         Medication List    TAKE these medications       albuterol 108 (90 BASE) MCG/ACT inhaler  Commonly known as:  PROVENTIL HFA;VENTOLIN HFA  Inhale 2 puffs into the lungs every 6 (six) hours as needed for shortness of breath.     albuterol (2.5 MG/3ML) 0.083% nebulizer solution  Commonly known as:  PROVENTIL  Take 2.5 mg by nebulization every 6 (six) hours as needed for shortness  of breath.     budesonide-formoterol 160-4.5 MCG/ACT inhaler  Commonly known as:  SYMBICORT  Inhale 2 puffs into the lungs 2 (two) times daily.     fluticasone 50 MCG/ACT nasal spray  Commonly known as:  FLONASE  Place 2 sprays into the nose daily as needed for rhinitis or allergies.     ibuprofen 200 MG tablet  Commonly known as:  ADVIL,MOTRIN  Take 400 mg by mouth every 6 (six) hours as needed for pain.     insulin glargine 100 UNIT/ML injection  Commonly known as:  LANTUS  Inject 0.36 mLs (36 Units total) into the skin at bedtime. 36 units at night     loratadine 10 MG tablet  Commonly known as:  CLARITIN  Take 10 mg by mouth daily.     NOVOLOG FLEXPEN 100 UNIT/ML injection  Generic drug:  insulin aspart  Inject 0-20 Units into the skin as directed.  SSI - carb scale, 0-20 units     PROBIOTIC DAILY PO  Take 1 capsule by mouth daily.       Allergies  Allergen Reactions  . Peanut-Containing Drug Products Swelling    Swelling of mouth, lips  . Strawberry Swelling    Swelling of mouth, lips      The results of significant diagnostics from this hospitalization (including imaging, microbiology, ancillary and laboratory) are listed below for reference.    Significant Diagnostic Studies: Dg Chest 2 View  06/29/2012   *RADIOLOGY REPORT*  Clinical Data: Asthma, diabetes, vomiting  CHEST - 2 VIEW  Comparison:  11/01/2007  Findings:  The heart size and mediastinal contours are within normal limits.  Both lungs are clear.  The visualized skeletal structures are unremarkable.  IMPRESSION: No active cardiopulmonary disease.   Original Report Authenticated By: Judie Petit. Miles Costain, M.D.    Microbiology: Recent Results (from the past 240 hour(s))  URINE CULTURE     Status: None   Collection Time    06/29/12 10:40 AM      Result Value Range Status   Specimen Description URINE, CLEAN CATCH   Final   Special Requests NONE   Final   Culture  Setup Time 06/29/2012 15:57   Final   Colony Count 30,000 COLONIES/ML   Final   Culture     Final   Value: GROUP B STREP(S.AGALACTIAE)ISOLATED     Note: TESTING AGAINST S. AGALACTIAE NOT ROUTINELY PERFORMED DUE TO PREDICTABILITY OF AMP/PEN/VAN SUSCEPTIBILITY.   Report Status 06/30/2012 FINAL   Final  MRSA PCR SCREENING     Status: None   Collection Time    06/29/12  2:52 PM      Result Value Range Status   MRSA by PCR NEGATIVE  NEGATIVE Final   Comment:            The GeneXpert MRSA Assay (FDA     approved for NASAL specimens     only), is one component of a     comprehensive MRSA colonization     surveillance program. It is not     intended to diagnose MRSA     infection nor to guide or     monitor treatment for     MRSA infections.     Labs: Basic Metabolic Panel:  Recent Labs Lab 06/30/12 0343  06/30/12 0630 06/30/12 0745 06/30/12 1303 07/01/12 0519  NA 135 136 135 136 140  K 3.5 3.5 3.5 3.8 3.6  CL 107 108 106 105 106  CO2 18* 20 21 19  23  GLUCOSE 82 66* 58* 166* 166*  BUN 9 9 9 8 6   CREATININE 0.51 0.53 0.52 0.51 0.51  CALCIUM 8.3* 8.3* 8.4 8.5 8.2*   Liver Function Tests:  Recent Labs Lab 06/29/12 1040  AST 181*  ALT 77*  ALKPHOS 155*  BILITOT 0.7  PROT 7.7  ALBUMIN 3.9   No results found for this basename: LIPASE, AMYLASE,  in the last 168 hours No results found for this basename: AMMONIA,  in the last 168 hours CBC:  Recent Labs Lab 06/29/12 1040 06/29/12 1048 07/01/12 0519  WBC 17.9*  --  5.2  NEUTROABS 15.3*  --   --   HGB 13.5 15.3* 10.8*  HCT 41.0 45.0 32.3*  MCV 93.0  --  90.2  PLT 415*  --  274   Cardiac Enzymes: No results found for this basename: CKTOTAL, CKMB, CKMBINDEX, TROPONINI,  in the last 168 hours BNP: BNP (last 3 results) No results found for this basename: PROBNP,  in the last 8760 hours CBG:  Recent Labs Lab 06/30/12 0818 06/30/12 1254 06/30/12 1650 06/30/12 2145 07/01/12 0736  GLUCAP 110* 156* 221* 316* 120*       Signed:  Penny Pia  [facility] 07/01/2012, 10:42 AM

## 2012-08-03 ENCOUNTER — Other Ambulatory Visit: Payer: Self-pay | Admitting: *Deleted

## 2012-08-03 DIAGNOSIS — E1065 Type 1 diabetes mellitus with hyperglycemia: Secondary | ICD-10-CM

## 2012-08-04 ENCOUNTER — Other Ambulatory Visit: Payer: BC Managed Care – PPO

## 2012-08-08 ENCOUNTER — Ambulatory Visit: Payer: BC Managed Care – PPO | Admitting: Endocrinology

## 2012-10-04 ENCOUNTER — Other Ambulatory Visit: Payer: Self-pay | Admitting: *Deleted

## 2012-10-04 MED ORDER — "INSULIN SYRINGE 30G X 5/16"" 0.5 ML MISC": 1.0000 | Freq: Three times a day (TID) | Status: DC

## 2012-10-10 ENCOUNTER — Ambulatory Visit: Payer: BC Managed Care – PPO | Admitting: Endocrinology

## 2012-11-04 ENCOUNTER — Ambulatory Visit: Payer: BC Managed Care – PPO | Admitting: Endocrinology

## 2012-11-07 ENCOUNTER — Encounter: Payer: BC Managed Care – PPO | Admitting: Endocrinology

## 2012-11-07 ENCOUNTER — Other Ambulatory Visit (INDEPENDENT_AMBULATORY_CARE_PROVIDER_SITE_OTHER): Payer: BC Managed Care – PPO

## 2012-11-07 ENCOUNTER — Encounter: Payer: Self-pay | Admitting: Endocrinology

## 2012-11-07 ENCOUNTER — Other Ambulatory Visit: Payer: Self-pay | Admitting: *Deleted

## 2012-11-07 ENCOUNTER — Other Ambulatory Visit (INDEPENDENT_AMBULATORY_CARE_PROVIDER_SITE_OTHER): Payer: BC Managed Care – PPO | Admitting: Endocrinology

## 2012-11-07 DIAGNOSIS — E1065 Type 1 diabetes mellitus with hyperglycemia: Secondary | ICD-10-CM

## 2012-11-07 DIAGNOSIS — IMO0002 Reserved for concepts with insufficient information to code with codable children: Secondary | ICD-10-CM

## 2012-11-07 DIAGNOSIS — Z23 Encounter for immunization: Secondary | ICD-10-CM

## 2012-11-07 LAB — HEMOGLOBIN A1C: Hgb A1c MFr Bld: 8.7 % — ABNORMAL HIGH (ref 4.6–6.5)

## 2012-11-07 LAB — BASIC METABOLIC PANEL
CO2: 23 mEq/L (ref 19–32)
Chloride: 104 mEq/L (ref 96–112)
Creatinine, Ser: 0.7 mg/dL (ref 0.4–1.2)
Glucose, Bld: 136 mg/dL — ABNORMAL HIGH (ref 70–99)

## 2012-11-07 LAB — MICROALBUMIN / CREATININE URINE RATIO: Microalb, Ur: 0.2 mg/dL (ref 0.0–1.9)

## 2012-11-07 LAB — URINALYSIS
Bilirubin Urine: NEGATIVE
Hgb urine dipstick: NEGATIVE
Leukocytes, UA: NEGATIVE
Nitrite: NEGATIVE
Total Protein, Urine: NEGATIVE
Urobilinogen, UA: 0.2 (ref 0.0–1.0)

## 2012-11-11 ENCOUNTER — Telehealth: Payer: Self-pay | Admitting: Endocrinology

## 2012-11-11 ENCOUNTER — Ambulatory Visit: Payer: BC Managed Care – PPO | Admitting: Endocrinology

## 2012-11-11 DIAGNOSIS — Z0289 Encounter for other administrative examinations: Secondary | ICD-10-CM

## 2012-11-11 NOTE — Telephone Encounter (Signed)
Please see the note below.

## 2012-11-11 NOTE — Telephone Encounter (Signed)
Pt has no showed or cancelled in less 24 hours, the last 2 appts she has had with Dr. Lucianne Muss. She has done the same again today - calling the day of the appt to r/s / Sherri

## 2012-11-16 ENCOUNTER — Ambulatory Visit (INDEPENDENT_AMBULATORY_CARE_PROVIDER_SITE_OTHER): Payer: BC Managed Care – PPO | Admitting: Endocrinology

## 2012-11-16 ENCOUNTER — Encounter: Payer: Self-pay | Admitting: Endocrinology

## 2012-11-16 ENCOUNTER — Ambulatory Visit: Payer: BC Managed Care – PPO | Admitting: Endocrinology

## 2012-11-16 VITALS — BP 122/78 | HR 110 | Temp 97.9°F | Resp 12 | Ht 64.0 in | Wt 129.3 lb

## 2012-11-16 DIAGNOSIS — E1065 Type 1 diabetes mellitus with hyperglycemia: Secondary | ICD-10-CM

## 2012-11-16 NOTE — Patient Instructions (Addendum)
Divide carbs by 5 for Novolog  Please check blood sugars at least half the time about 2 hours after any meal and as directed on waking up.  Please bring blood sugar monitor to each visit

## 2012-11-16 NOTE — Progress Notes (Signed)
[name], female   DOB: 03/13/1990, 22 y.o.   MRN: [id number]  [name] is an 22 y.o. female.   Reason for Appointment : Follow up for Type 1 Diabetes  History of Present Illness          Diagnosis: Type 1 diabetes mellitus, long-standing  Past history: She had previously been on an insulin pump with fair control but stopped this because of the cost of supplies She has generally had poor control in the last couple of years at least because of noncompliance with glucose monitoring and periodically running out of insulin. Also has had significant variability in her blood sugars Records from her recent visits are not available yet  INSULIN regimen is described as: Novolog upto 15 lunch and supper;  probably covering meals with 1:6 carb ratio lantus 36 at bedtime  Recent history:  She has checked her blood sugar only sporadically and has started monitoring only in the last week or so She says she has had a lot of personal issues and stress and has not been paying attention to her diabetes management Again blood sugars are overall poorly controlled with high A1c Does not have any consistent pattern of her blood sugars, recently higher in the morning but lower about a week ago including a reading of 50 May possibly forget to take her insulin at mealtimes occasionally She thinks she is counting carbohydrates but appears to be doing her insulin doses somewhat arbitrarily    Glucose monitoring:  done times a day         Glucometer: One Touch.      Blood Glucose readings from meter download: readings before breakfast:  50-264, 11 AM 173, 4 PM 312          Hypoglycemia:  has had one low reading at 8 AM which is unexplained  Self-care: The diet that the patient has been following is: None, eating out at restaurants frequently and not paying attention to choices  Meals: 2- 3 meals per day.          Physical activity: exercise:.None           Dietician visit: Most  recent:.?           Retinal exam: Most recent:    Lab Results  Component Value Date   HGBA1C 8.7* 11/07/2012    Lab Results  Component Value Date   MICROALBUR 0.2 11/07/2012      Medication List       This list is accurate as of: 11/16/12 10:47 AM.  Always use your most recent med list.               albuterol 108 (90 BASE) MCG/ACT inhaler  Commonly known as:  PROVENTIL HFA;VENTOLIN HFA  Inhale 2 puffs into the lungs every 6 (six) hours as needed for shortness of breath.     albuterol (2.5 MG/3ML) 0.083% nebulizer solution  Commonly known as:  PROVENTIL  Take 2.5 mg by nebulization every 6 (six) hours as needed for shortness of breath.     budesonide-formoterol 160-4.5 MCG/ACT inhaler  Commonly known as:  SYMBICORT  Inhale 2 puffs into the lungs 2 (two) times daily.     fluticasone 50 MCG/ACT nasal spray  Commonly known as:  FLONASE  Place 2 sprays into the nose daily as needed for rhinitis or allergies.     ibuprofen 200 MG tablet  Commonly known as:  ADVIL,MOTRIN  Take 400 mg by mouth every 6 (  six) hours as needed for pain.     insulin glargine 100 UNIT/ML injection  Commonly known as:  LANTUS  Inject 0.36 mLs (36 Units total) into the skin at bedtime. 36 units at night     INSULIN SYRINGE .5CC/30GX5/16" 30G X 5/16" 0.5 ML Misc  1 each by Does not apply route 3 (three) times daily.     loratadine 10 MG tablet  Commonly known as:  CLARITIN  Take 10 mg by mouth daily.     NOVOLOG FLEXPEN 100 UNIT/ML injection  Generic drug:  insulin aspart  Inject 0-20 Units into the skin as directed. SSI - carb scale, 0-20 units     PROBIOTIC DAILY PO  Take 1 capsule by mouth daily.        Allergies:  Allergies  Allergen Reactions  . Peanut-Containing Drug Products Swelling    Swelling of mouth, lips  . Strawberry Swelling    Swelling of mouth, lips    Past Medical History  Diagnosis Date  . Diabetes mellitus   . Asthma   . Hypertension   .  ZOXWRUEA(540.9)     Past Surgical History  Procedure Laterality Date  . Wisdom tooth extraction      Family History  Problem Relation Age of Onset  . Diabetes Mother   . Hyperlipidemia Mother   . Hypertension Father   . Heart disease Father   . Heart disease Maternal Grandmother   . Heart disease Maternal Grandfather   . Hypertension Paternal Grandmother   . Cancer Paternal Grandfather     Social History:  reports that she has never smoked. She has never used smokeless tobacco. She reports that she does not drink alcohol or use illicit drugs.    Review of Systems:   No history of hypertension recently  LABS:  No visits with results within 1 Week(s) from this visit. Latest known visit with results is:  Appointment on 11/07/2012  Component Date Value Range Status  . Color, Urine 11/07/2012 LT. YELLOW  Yellow;Lt. Yellow Final  . APPearance 11/07/2012 CLEAR  Clear Final  . Specific Gravity, Urine 11/07/2012 1.020  1.000-1.030 Final  . pH 11/07/2012 5.5  5.0 - 8.0 Final  . Total Protein, Urine 11/07/2012 NEGATIVE  Negative Final  . Urine Glucose 11/07/2012 >=1000  Negative Final  . Ketones, ur 11/07/2012 40  Negative Final  . Bilirubin Urine 11/07/2012 NEGATIVE  Negative Final  . Hgb urine dipstick 11/07/2012 NEGATIVE  Negative Final  . Urobilinogen, UA 11/07/2012 0.2  0.0 - 1.0 Final  . Leukocytes, UA 11/07/2012 NEGATIVE  Negative Final  . Nitrite 11/07/2012 NEGATIVE  Negative Final  . Microalb, Ur 11/07/2012 0.2  0.0 - 1.9 mg/dL Final  . Creatinine,U 81/19/1478 40.9   Final  . Microalb Creat Ratio 11/07/2012 0.5  0.0 - 30.0 mg/g Final  . Hemoglobin A1C 11/07/2012 8.7* 4.6 - 6.5 % Final   Glycemic Control Guidelines for People with Diabetes:Non Diabetic:  <6%Goal of Therapy: <7%Additional Action Suggested:  >8%   . Sodium 11/07/2012 139  135 - 145 mEq/L Final  . Potassium 11/07/2012 4.3  3.5 - 5.1 mEq/L Final  . Chloride 11/07/2012 104  96 - 112 mEq/L Final  . CO2  11/07/2012 23  19 - 32 mEq/L Final  . Glucose, Bld 11/07/2012 136* 70 - 99 mg/dL Final  . BUN 29/56/2130 13  6 - 23 mg/dL Final  . Creatinine, Ser 11/07/2012 0.7  0.4 - 1.2 mg/dL Final  . Calcium 86/57/8469 9.5  8.4 - 10.5 mg/dL Final  . GFR 16/10/9602 142.69  >60.00 mL/min Final    Physical Examination:  BP 122/78  Pulse 110  Temp(Src) 97.9 F (36.6 C)  Resp 12  Ht 5\' 4"  (1.626 m)  Wt 129 lb 4.8 oz (58.65 kg)  BMI 22.18 kg/m2  SpO2 98%  LMP 10/18/2012  Breastfeeding? No         ASSESSMENT:  Diabetes type 1: She has poor control and the following problems were identified and discussed with the patient 1. Very infrequent glucose monitoring and checking mostly in the morning 2. No consistent pattern of blood sugars in the morning 3. Taking mealtime coverage somewhat arbitrarily. Discussed that she will need to try and do carbohydrate counting and for simplicity use a carbohydrate ratio of 1:5 and also adjust for high readings 4. Periodically forgetting mealtime doses 5.  poor diet and eating out frequently 6. Occasional hypoglycemia on waking up which is unexplained 7. Sometimes having difficulty affording her insulin 8. Not clear if he is on a relatively high Lantus insulin dose since morning sugars are variable. Discussed possibly reducing it back by 2- 3 units if morning sugars are more consistently low after doing better with mealtime coverage   Counseling time over 50% of today's 25 minute visit  Kaysia Willard 11/16/2012, 10:47 AM

## 2012-11-29 ENCOUNTER — Encounter: Payer: BC Managed Care – PPO | Attending: Endocrinology | Admitting: Nutrition

## 2012-12-02 ENCOUNTER — Other Ambulatory Visit: Payer: Self-pay | Admitting: *Deleted

## 2012-12-02 MED ORDER — INSULIN ASPART 100 UNIT/ML ~~LOC~~ SOLN: 0.0000 [IU] | SUBCUTANEOUS | Status: DC

## 2012-12-06 ENCOUNTER — Encounter (HOSPITAL_COMMUNITY): Payer: Self-pay | Admitting: Emergency Medicine

## 2012-12-06 ENCOUNTER — Emergency Department (HOSPITAL_COMMUNITY)
Admission: EM | Admit: 2012-12-06 | Discharge: 2012-12-06 | Disposition: A | Discharge status: Home / Self Care | Payer: BC Managed Care – PPO | Attending: Emergency Medicine | Admitting: Emergency Medicine

## 2012-12-06 ENCOUNTER — Emergency Department (HOSPITAL_COMMUNITY): Payer: BC Managed Care – PPO

## 2012-12-06 DIAGNOSIS — J45901 Unspecified asthma with (acute) exacerbation: Secondary | ICD-10-CM | POA: Insufficient documentation

## 2012-12-06 DIAGNOSIS — Z791 Long term (current) use of non-steroidal anti-inflammatories (NSAID): Secondary | ICD-10-CM | POA: Insufficient documentation

## 2012-12-06 DIAGNOSIS — R000 Tachycardia, unspecified: Secondary | ICD-10-CM | POA: Insufficient documentation

## 2012-12-06 DIAGNOSIS — E119 Type 2 diabetes mellitus without complications: Secondary | ICD-10-CM | POA: Insufficient documentation

## 2012-12-06 DIAGNOSIS — Z794 Long term (current) use of insulin: Secondary | ICD-10-CM | POA: Insufficient documentation

## 2012-12-06 DIAGNOSIS — I10 Essential (primary) hypertension: Secondary | ICD-10-CM | POA: Insufficient documentation

## 2012-12-06 DIAGNOSIS — R011 Cardiac murmur, unspecified: Secondary | ICD-10-CM | POA: Insufficient documentation

## 2012-12-06 DIAGNOSIS — Z79899 Other long term (current) drug therapy: Secondary | ICD-10-CM | POA: Insufficient documentation

## 2012-12-06 DIAGNOSIS — R739 Hyperglycemia, unspecified: Secondary | ICD-10-CM

## 2012-12-06 LAB — BASIC METABOLIC PANEL
BUN: 18 mg/dL (ref 6–23)
CO2: 20 mEq/L (ref 19–32)
Calcium: 10.4 mg/dL (ref 8.4–10.5)
Chloride: 99 mEq/L (ref 96–112)
Creatinine, Ser: 0.66 mg/dL (ref 0.50–1.10)
GFR calc Af Amer: >90 mL/min (ref >90)

## 2012-12-06 LAB — URINALYSIS, ROUTINE W REFLEX MICROSCOPIC
Glucose, UA: >1000 mg/dL — AB
Ketones, ur: 15 mg/dL — AB
Nitrite: NEGATIVE
Protein, ur: NEGATIVE mg/dL
Urobilinogen, UA: 0.2 mg/dL (ref 0.0–1.0)
pH: 6 (ref 5.0–8.0)

## 2012-12-06 LAB — POCT I-STAT, CHEM 8
BUN: 21 mg/dL (ref 6–23)
Calcium, Ion: 1.16 mmol/L (ref 1.12–1.23)
Chloride: 103 mEq/L (ref 96–112)
Creatinine, Ser: 0.8 mg/dL (ref 0.50–1.10)
Glucose, Bld: 139 mg/dL — ABNORMAL HIGH (ref 70–99)
HCT: 37 % (ref 36.0–46.0)
Hemoglobin: 12.6 g/dL (ref 12.0–15.0)
Potassium: 3.4 mEq/L — ABNORMAL LOW (ref 3.5–5.1)
Sodium: 141 mEq/L (ref 135–145)
TCO2: 23 mmol/L (ref 0–100)

## 2012-12-06 LAB — URINE MICROSCOPIC-ADD ON

## 2012-12-06 LAB — CBC
HCT: 42.1 % (ref 36.0–46.0)
MCH: 31.8 pg (ref 26.0–34.0)
MCV: 94.2 fL (ref 78.0–100.0)
RDW: 12.6 % (ref 11.5–15.5)
WBC: 8.8 10*3/uL (ref 4.0–10.5)

## 2012-12-06 MED ORDER — SODIUM CHLORIDE 0.9 % IV BOLUS (SEPSIS)
1000.0000 mL | Freq: Once | INTRAVENOUS | Status: AC
  Administered 2012-12-06: 1000 mL via INTRAVENOUS

## 2012-12-06 MED ORDER — ALBUTEROL SULFATE (5 MG/ML) 0.5% IN NEBU
5.0000 mg | INHALATION_SOLUTION | Freq: Once | RESPIRATORY_TRACT | Status: AC
  Administered 2012-12-06: 5 mg via RESPIRATORY_TRACT

## 2012-12-06 MED ORDER — IBUPROFEN 800 MG PO TABS: 800.0000 mg | ORAL_TABLET | Freq: Three times a day (TID) | ORAL | Status: DC

## 2012-12-06 MED ORDER — IBUPROFEN 800 MG PO TABS
800.0000 mg | ORAL_TABLET | Freq: Once | ORAL | Status: AC
  Administered 2012-12-06: 800 mg via ORAL
  Filled 2012-12-06: qty 1

## 2012-12-06 NOTE — ED Notes (Signed)
Pt was in radiology when i went for EKG

## 2012-12-06 NOTE — ED Notes (Signed)
Pt c/o of SOB that started last week. States that she went to her asthma and allergy dr, dx bronchitis. Antibiotics no relief. Also c/o of chest pain 8/10 centralized x2 days. Hx diabetes.

## 2012-12-06 NOTE — ED Provider Notes (Signed)
CSN: [id number]     Arrival date & time 12/06/12  1535 History   First MD Initiated Contact with Patient 12/06/12 1603     Chief Complaint  Patient presents with  . Shortness of Breath  . Chest Pain  . Hyperglycemia   (Consider location/radiation/quality/duration/timing/severity/associated sxs/prior Treatment) The history is provided by the patient and medical records. No language interpreter was used.    [name] is a 22 y.o. female  with a hx of IDDM, Asthma, allergies presents to the Emergency Department complaining of gradual, persistent, progressively worsening SOB, cough onset 3 weeks ago. Associated symptoms include fatigues, fever.  Pt saw asthma and allergy center last week and was dx with bronchitis and given prednisone and azirthromycin without relief.  She reports using her albuterol inhaler, symbicort and singulair at home without relief.  She reports developing L sided chest pain today which is worsened by deep breathing and cold air.  She reports that her coughing episodes are aggravating her asthma.  She has an appointment with the asthma, allergist tomorrow.  Pt reports the albuterol makes the SOB and coughing better only sometimes and being outside and activity makes it worse.  Even with simple tasks like walking to her car are causing SOB and chest pain.  Pt denies current fever, chills, headache, neck pain, feeling of throat closing, neck stiffness, abd pain, N/V/D, weakness, dizziness, syncope, dysuira.     Allergist: ? Darrol Poke   Past Medical History  Diagnosis Date  . Diabetes mellitus   . Asthma   . Hypertension   . [id number](253.6)    Past Surgical History  Procedure Laterality Date  . Wisdom tooth extraction     Family History  Problem Relation Age of Onset  . Diabetes Mother   . Hyperlipidemia Mother   . Hypertension Father   . Heart disease Father   . Heart disease Maternal Grandmother   . Heart disease Maternal Grandfather   .  Hypertension Paternal Grandmother   . Cancer Paternal Grandfather    History  Substance Use Topics  . Smoking status: Never Smoker   . Smokeless tobacco: Never Used  . Alcohol Use: No   OB History   Grav Para Term Preterm Abortions TAB SAB Ect Mult Living   2 1 0 1 1 1 0 0 0 1      Review of Systems  Constitutional: Positive for fever. Negative for diaphoresis, appetite change, fatigue and unexpected weight change.  HENT: Negative for mouth sores.   Eyes: Negative for visual disturbance.  Respiratory: Positive for cough, chest tightness, shortness of breath and wheezing.   Cardiovascular: Positive for chest pain.  Gastrointestinal: Negative for nausea, vomiting, abdominal pain, diarrhea and constipation.  Endocrine: Positive for polyuria. Negative for polydipsia and polyphagia.  Genitourinary: Negative for dysuria, urgency, frequency and hematuria.  Musculoskeletal: Negative for back pain and neck stiffness.  Skin: Negative for rash.  Allergic/Immunologic: Negative for immunocompromised state.  Neurological: Negative for syncope, light-headedness and headaches.  Hematological: Does not bruise/bleed easily.  Psychiatric/Behavioral: Negative for sleep disturbance. The patient is not nervous/anxious.     Allergies  Peanut-containing drug products and Strawberry  Home Medications   Current Outpatient Rx  Name  Route  Sig  Dispense  Refill  . albuterol (PROVENTIL HFA;VENTOLIN HFA) 108 (90 BASE) MCG/ACT inhaler   Inhalation   Inhale 2 puffs into the lungs every 6 (six) hours as needed for shortness of breath.         [name] albuterol (  PROVENTIL) (2.5 MG/3ML) 0.083% nebulizer solution   Nebulization   Take 2.5 mg by nebulization every 6 (six) hours as needed for shortness of breath.         . budesonide-formoterol (SYMBICORT) 160-4.5 MCG/ACT inhaler   Inhalation   Inhale 2 puffs into the lungs 2 (two) times daily.         [name] EPINEPHrine (EPIPEN) 0.3 mg/0.3 mL SOAJ  injection   Intramuscular   Inject 0.3 mg into the muscle once.         . fluticasone (FLONASE) 50 MCG/ACT nasal spray   Nasal   Place 2 sprays into the nose daily as needed for rhinitis or allergies.          [name] ibuprofen (ADVIL,MOTRIN) 200 MG tablet   Oral   Take 400 mg by mouth every 6 (six) hours as needed for pain.         [name] insulin aspart (NOVOLOG FLEXPEN) 100 UNIT/ML injection   Subcutaneous   Inject 0-20 Units into the skin as directed. SSI - carb scale, 0-20 units   4 vial   5     Please disregard previous rx, patient needs 4 vial ...   . insulin glargine (LANTUS) 100 UNIT/ML injection   Subcutaneous   Inject 0.36 mLs (36 Units total) into the skin at bedtime. 36 units at night   10 mL   0   . montelukast (SINGULAIR) 10 MG tablet   Oral   Take 10 mg by mouth daily.         . Probiotic Product (PROBIOTIC DAILY PO)   Oral   Take 1 capsule by mouth daily.         [name] ibuprofen (ADVIL,MOTRIN) 800 MG tablet   Oral   Take 1 tablet (800 mg total) by mouth 3 (three) times daily.   21 tablet   0    BP 124/83  Pulse 104  Temp(Src) 98 F (36.7 C) (Oral)  Resp 20  SpO2 100%  LMP 11/19/2012  Breastfeeding? No Physical Exam  Nursing note and vitals reviewed. Constitutional: She is oriented to person, place, and time. She appears well-developed and well-nourished. No distress.  Awake, alert, nontoxic appearance  HENT:  Head: Normocephalic and atraumatic.  Right Ear: Tympanic membrane, external ear and ear canal normal.  Left Ear: Tympanic membrane, external ear and ear canal normal.  Nose: Mucosal edema and rhinorrhea present. No epistaxis. Right sinus exhibits no maxillary sinus tenderness and no frontal sinus tenderness. Left sinus exhibits no maxillary sinus tenderness and no frontal sinus tenderness.  Mouth/Throat: Uvula is midline, oropharynx is clear and moist and mucous membranes are normal. Mucous membranes are not pale and not cyanotic. No  oropharyngeal exudate, posterior oropharyngeal edema, posterior oropharyngeal erythema or tonsillar abscesses.  Eyes: Conjunctivae are normal. Pupils are equal, round, and reactive to light. No scleral icterus.  Neck: Normal range of motion and full passive range of motion without pain. Neck supple.  Cardiovascular: Regular rhythm and intact distal pulses.  Tachycardia present.   Murmur heard. Pulses:      Radial pulses are 2+ on the right side, and 2+ on the left side.       Dorsalis pedis pulses are 2+ on the right side, and 2+ on the left side.       Posterior tibial pulses are 2+ on the right side, and 2+ on the left side.  tachycardia  Pulmonary/Chest: Effort normal. No accessory muscle usage or stridor. Not tachypneic.  No respiratory distress. She has decreased breath sounds (througout). She has no wheezes. She has rhonchi (in bases). She has no rales. She exhibits tenderness.    Tenderness to palpation along the Left ribs  Abdominal: Soft. Bowel sounds are normal. She exhibits no distension and no mass. There is no tenderness. There is no rebound and no guarding.  Musculoskeletal: Normal range of motion. She exhibits no edema.  Lymphadenopathy:    She has no cervical adenopathy.  Neurological: She is alert and oriented to person, place, and time. She exhibits normal muscle tone. Coordination normal.  Speech is clear and goal oriented Moves extremities without ataxia  Skin: Skin is warm and dry. No rash noted. She is not diaphoretic. No erythema.  Psychiatric: She has a normal mood and affect.    ED Course  Procedures (including critical care time) Labs Review Labs Reviewed  GLUCOSE, CAPILLARY - Abnormal; Notable for the following:    Glucose-Capillary 463 (*)    All other components within normal limits  BASIC METABOLIC PANEL - Abnormal; Notable for the following:    Glucose, Bld 246 (*)    All other components within normal limits  URINALYSIS, ROUTINE W REFLEX MICROSCOPIC -  Abnormal; Notable for the following:    Specific Gravity, Urine 1.041 (*)    Glucose, UA >1000 (*)    Hgb urine dipstick MODERATE (*)    Ketones, ur 15 (*)    Leukocytes, UA SMALL (*)    All other components within normal limits  URINE MICROSCOPIC-ADD ON - Abnormal; Notable for the following:    Squamous Epithelial / LPF FEW (*)    All other components within normal limits  POCT I-STAT, CHEM 8 - Abnormal; Notable for the following:    Potassium 3.4 (*)    Glucose, Bld 139 (*)    All other components within normal limits  CBC   Imaging Review Dg Chest 2 View  12/06/2012   CLINICAL DATA:  Cough, shortness of breath, history of asthma  EXAM: CHEST  2 VIEW  COMPARISON:  06/29/2012  FINDINGS: The heart size and mediastinal contours are within normal limits. Both lungs are clear. The visualized skeletal structures are unremarkable.  IMPRESSION: No active cardiopulmonary disease.   Electronically Signed   By: Elige Ko   On: 12/06/2012 16:55    EKG Interpretation    Date/Time:  Tuesday December 06 2012 15:46:25 EST Ventricular Rate:  116 PR Interval:  140 QRS Duration: 77 QT Interval:  306 QTC Calculation: 425 R Axis:   69 Text Interpretation:  Sinus tachycardia Baseline wander in lead(s) II III aVF V3 V4 Non-specific ST-t changes No old tracing to compare Confirmed by KOHUT  MD, STEPHEN ([id number]) on 12/06/2012 7:07:47 PM            MDM   1. Asthma exacerbation   2. Hyperglycemia      [name] presents with SOB, CP, cough and hyperglycemia.  Concern for possible PNA vs asthma exacerbation.  Awaiting labs to determine if pt is in DKA.    6:16 PM Patient with increased tidal volume after albuterol and audible wheezes in the bases. Will repeat. Chest x-ray without evidence of pneumonia, pneumothorax or pulmonary edema. AG is 19 with hyperglycemia.  Hyperglycemia is likely 2/2 poor control and steroid use.  Will give second fluid bolus and recheck chem 8 to ensure  closing of anion gap.     6:53 PM AG 15 after 1 L NS.  Will give 2nd  liter.  PT with clear and equal breath sounds after 2nd albuterol treatment and reports that L rib pain is almost resolved after albuterol and ibuprofen.    8:24 PM Pt remains alert and oriented, nontoxic, nonseptic appearing. Patient with clear and equal breath sounds after second albuterol treatment. No respiratory distress. Patient given second liter of normal saline. Expect that her anion gap has closed this time and only 15 ketones were found in her urine.  Patient has a followup with her asthma and allergy physician [facility] pulmonology tomorrow. Encourage her to keep that appointment.  It has been determined that no acute conditions requiring further emergency intervention are present at this time. The patient/guardian have been advised of the diagnosis and plan. We have discussed signs and symptoms that warrant return to the ED, such as changes or worsening in symptoms.   Patient/guardian has voiced understanding and agreed to follow-up with the PCP or specialist.      Dierdre Forth, PA-C 12/06/12 2337

## 2012-12-07 NOTE — ED Provider Notes (Signed)
Medical screening examination/treatment/procedure(s) were performed by non-physician practitioner and as supervising physician I was immediately available for consultation/collaboration.  EKG Interpretation    Date/Time:  Tuesday December 06 2012 15:46:25 EST Ventricular Rate:  116 PR Interval:  140 QRS Duration: 77 QT Interval:  306 QTC Calculation: 425 R Axis:   69 Text Interpretation:  Sinus tachycardia Baseline wander in lead(s) II III aVF V3 V4 Non-specific ST-t changes No old tracing to compare Confirmed by Juleen China  MD, Salih Williamson [phone]) on 12/06/2012 7:07:47 PM             Raeford Razor, MD 12/07/12 [phone]

## 2013-01-16 ENCOUNTER — Ambulatory Visit: Payer: BC Managed Care – PPO | Admitting: Endocrinology

## 2013-01-24 ENCOUNTER — Other Ambulatory Visit: Payer: Self-pay | Admitting: *Deleted

## 2013-01-24 MED ORDER — INSULIN GLARGINE 100 UNIT/ML SOLOSTAR PEN: 36.0000 [IU] | PEN_INJECTOR | Freq: Every day | SUBCUTANEOUS | Status: DC

## 2013-02-20 ENCOUNTER — Other Ambulatory Visit: Payer: Self-pay | Admitting: *Deleted

## 2013-02-20 MED ORDER — INSULIN PEN NEEDLE 32G X 4 MM MISC
Status: DC
Start: 2013-02-20 — End: 2014-11-24

## 2013-04-18 ENCOUNTER — Encounter (HOSPITAL_COMMUNITY): Payer: Self-pay | Admitting: Emergency Medicine

## 2013-04-18 ENCOUNTER — Emergency Department (HOSPITAL_COMMUNITY)
Admission: EM | Admit: 2013-04-18 | Discharge: 2013-04-18 | Disposition: A | Discharge status: Home / Self Care | Payer: BC Managed Care – PPO | Attending: Emergency Medicine | Admitting: Emergency Medicine

## 2013-04-18 ENCOUNTER — Emergency Department (HOSPITAL_COMMUNITY): Payer: BC Managed Care – PPO

## 2013-04-18 DIAGNOSIS — I10 Essential (primary) hypertension: Secondary | ICD-10-CM | POA: Insufficient documentation

## 2013-04-18 DIAGNOSIS — G8929 Other chronic pain: Secondary | ICD-10-CM | POA: Insufficient documentation

## 2013-04-18 DIAGNOSIS — R5383 Other fatigue: Secondary | ICD-10-CM

## 2013-04-18 DIAGNOSIS — J45909 Unspecified asthma, uncomplicated: Secondary | ICD-10-CM | POA: Insufficient documentation

## 2013-04-18 DIAGNOSIS — IMO0002 Reserved for concepts with insufficient information to code with codable children: Secondary | ICD-10-CM | POA: Insufficient documentation

## 2013-04-18 DIAGNOSIS — Z794 Long term (current) use of insulin: Secondary | ICD-10-CM | POA: Insufficient documentation

## 2013-04-18 DIAGNOSIS — Z79899 Other long term (current) drug therapy: Secondary | ICD-10-CM | POA: Insufficient documentation

## 2013-04-18 DIAGNOSIS — E119 Type 2 diabetes mellitus without complications: Secondary | ICD-10-CM | POA: Insufficient documentation

## 2013-04-18 DIAGNOSIS — M25569 Pain in unspecified knee: Secondary | ICD-10-CM | POA: Insufficient documentation

## 2013-04-18 DIAGNOSIS — R5381 Other malaise: Secondary | ICD-10-CM | POA: Insufficient documentation

## 2013-04-18 DIAGNOSIS — Z791 Long term (current) use of non-steroidal anti-inflammatories (NSAID): Secondary | ICD-10-CM | POA: Insufficient documentation

## 2013-04-18 MED ORDER — IBUPROFEN 600 MG PO TABS: 600.0000 mg | ORAL_TABLET | Freq: Four times a day (QID) | ORAL | Status: DC | PRN

## 2013-04-18 NOTE — ED Notes (Signed)
Pt c/o rt knee pain since this morning.  States it "gave out today and she almost fell".  Pain w/ ambulation.

## 2013-04-18 NOTE — ED Provider Notes (Signed)
Medical screening examination/treatment/procedure(s) were performed by non-physician practitioner and as supervising physician I was immediately available for consultation/collaboration.   EKG Interpretation None        Mervin Kung, MD 04/18/13 1438

## 2013-04-18 NOTE — ED Provider Notes (Signed)
CSN: [id number]     Arrival date & time 04/18/13  1241 History  This chart was scribed for non-physician practitioner Carlisle Cater, PA-C, working with Mervin Kung, MD, by Neta Ehlers, ED Scribe. This patient was seen in room WTR7/WTR7 and the patient's care was started at 2:05 PM.  First MD Initiated Contact with Patient 04/18/13 1359     Chief Complaint  Patient presents with  . Knee Pain    The history is provided by the patient. No language interpreter was used.   HPI Comments: [name] is a 23 y.o. female who presents to the Emergency Department complaining of chronic right knee pain which worsened this morning. She reports this morning the knee "locked" and became immobile for a time; this was a new symptom. She rates the pain as 8/10 and states the pain radiates up her thigh and down her calf.  She reports a h/o her knee giving out and states intermittent pain and difficulty ambulating. She reports that her knee intermittently gives out  with pressure, such as stepping out of a car. She denies numbness/paresthesia and  loss of sensation. 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 Onset: chronic. Course: worsening. Aggravating factors: ambulation and weight-bearing. Alleviating factors: none.   Past Medical History  Diagnosis Date  . Diabetes mellitus   . Asthma   . Hypertension   . WSFKCLEX(517.0)    Past Surgical History  Procedure Laterality Date  . Wisdom tooth extraction     Family History  Problem Relation Age of Onset  . Diabetes Mother   . Hyperlipidemia Mother   . Hypertension Father   . Heart disease Father   . Heart disease Maternal Grandmother   . Heart disease Maternal Grandfather   . Hypertension Paternal Grandmother   . Cancer Paternal Grandfather    History  Substance Use Topics  . Smoking status: Never Smoker   . Smokeless tobacco: Never Used  . Alcohol Use: No   OB History   Grav Para  Term Preterm Abortions TAB SAB Ect Mult Living   2 1 0 1 1 1 0 0 0 1      Review of Systems  Constitutional: Negative for activity change.  Musculoskeletal: Positive for arthralgias. Negative for back pain, joint swelling and neck pain.  Skin: Negative for wound.  Neurological: Positive for weakness. Negative for numbness.      Allergies  Peanut-containing drug products and Strawberry  Home Medications   Current Outpatient Rx  Name  Route  Sig  Dispense  Refill  . albuterol (PROVENTIL HFA;VENTOLIN HFA) 108 (90 BASE) MCG/ACT inhaler   Inhalation   Inhale 2 puffs into the lungs every 6 (six) hours as needed for shortness of breath.         [name] albuterol (PROVENTIL) (2.5 MG/3ML) 0.083% nebulizer solution   Nebulization   Take 2.5 mg by nebulization every 6 (six) hours as needed for shortness of breath.         . budesonide-formoterol (SYMBICORT) 160-4.5 MCG/ACT inhaler   Inhalation   Inhale 2 puffs into the lungs 2 (two) times daily.         [name] EPINEPHrine (EPIPEN) 0.3 mg/0.3 mL SOAJ injection   Intramuscular   Inject 0.3 mg into the muscle once.         . fluticasone (FLONASE) 50 MCG/ACT nasal spray   Nasal   Place 2 sprays into the nose daily as needed for rhinitis or  allergies.          [name] ibuprofen (ADVIL,MOTRIN) 200 MG tablet   Oral   Take 400 mg by mouth every 6 (six) hours as needed for pain.         [name] ibuprofen (ADVIL,MOTRIN) 800 MG tablet   Oral   Take 1 tablet (800 mg total) by mouth 3 (three) times daily.   21 tablet   0   . insulin aspart (NOVOLOG FLEXPEN) 100 UNIT/ML injection   Subcutaneous   Inject 0-20 Units into the skin as directed. SSI - carb scale, 0-20 units   4 vial   5     Please disregard previous rx, patient needs 4 vial ...   . Insulin Glargine (LANTUS SOLOSTAR) 100 UNIT/ML Solostar Pen   Subcutaneous   Inject 36 Units into the skin daily at 10 pm.   5 pen   1   . Insulin Pen Needle 32G X 4 MM MISC      Use as directed 3  times per day   90 each   3   . montelukast (SINGULAIR) 10 MG tablet   Oral   Take 10 mg by mouth daily.         . Probiotic Product (PROBIOTIC DAILY PO)   Oral   Take 1 capsule by mouth daily.          Triage Vitals: BP 127/80  Pulse 95  Temp(Src) 98.4 F (36.9 C) (Oral)  Resp 16  SpO2 96%  LMP 04/03/2013  Physical Exam  Nursing note and vitals reviewed. Constitutional: She appears well-developed and well-nourished. No distress.  HENT:  Head: Normocephalic and atraumatic.  Eyes: EOM are normal. Pupils are equal, round, and reactive to light.  Neck: Normal range of motion. Neck supple. No tracheal deviation present.  Cardiovascular: Normal rate.  Exam reveals no decreased pulses.   Pulses:      Dorsalis pedis pulses are 2+ on the right side, and 2+ on the left side.       Posterior tibial pulses are 2+ on the right side, and 2+ on the left side.  Pulmonary/Chest: Effort normal. No respiratory distress.  Musculoskeletal: Normal range of motion. She exhibits edema and tenderness.  Medial/lateral joint-line tenderness.  No effusion. Full ROM. Patient ambulatory with pain.   Neurological: She is alert. No sensory deficit.  Motor, sensation, and vascular distal to the injury is fully intact.   Skin: Skin is warm and dry.  Psychiatric: She has a normal mood and affect. Her behavior is normal.    ED Course  Procedures (including critical care time)  DIAGNOSTIC STUDIES: Oxygen Saturation is 96% on room air, normal by my interpretation.    COORDINATION OF CARE:  2:08 PM- Discussed treatment plan with patient, and the patient agreed to the plan. The plan includes imaging and a referral to an orthopedist.   Labs Review Labs Reviewed - No data to display Imaging Review Dg Knee Complete 4 Views Right  04/18/2013   CLINICAL DATA:  Knee pain  EXAM: RIGHT KNEE - COMPLETE 4+ VIEW  COMPARISON:  None.  FINDINGS: There is no evidence of fracture, dislocation, or joint  effusion. There is no evidence of arthropathy or other focal bone abnormality. Soft tissues are unremarkable.  IMPRESSION: Negative.   Electronically Signed   By: Franchot Gallo M.D.   On: 04/18/2013 13:22     EKG Interpretation None      2:31 PM Patient seen and examined. Pt informed  of results.   Vital signs reviewed and are as follows: Filed Vitals:   04/18/13 1253  BP: 127/80  Pulse: 95  Temp: 98.4 F (36.9 C)  Resp: 16   Patient was counseled on RICE protocol and told to rest injury, use ice for no longer than 15 minutes every hour, compress the area, and elevate above the level of their heart as much as possible to reduce swelling. Questions answered. Patient verbalized understanding.      MDM   Final diagnoses:  Knee pain   Patient with knee pain, negative x-ray. Concern exists for meniscal injury given clicking and locking described by the patient. Lower extremity is neurovascularly intact. I feel that she will benefit from orthopedic followup for further evaluation. Rice protocol indicated.  I personally performed the services described in this documentation, which was scribed in my presence. The recorded information has been reviewed and is accurate.     Carlisle Cater, PA-C 04/18/13 1432

## 2013-04-18 NOTE — Discharge Instructions (Signed)
Please read and follow all provided instructions.  Your diagnoses today include:  1. Knee pain     Tests performed today include:  An x-ray of the affected area - does NOT show any broken bones  Vital signs. See below for your results today.   Medications prescribed:   Ibuprofen (Motrin, Advil) - anti-inflammatory pain medication  Do not exceed 600mg  ibuprofen every 6 hours, take with food  You have been prescribed an anti-inflammatory medication or NSAID. Take with food. Take smallest effective dose for the shortest duration needed for your pain. Stop taking if you experience stomach pain or vomiting.   Take any prescribed medications only as directed.  Home care instructions:   Follow any educational materials contained in this packet  Follow R.I.C.E. Protocol:  R - rest your injury   I  - use ice on injury without applying directly to skin  C - compress injury with bandage or splint  E - elevate the injury as much as possible  Follow-up instructions: Please follow-up with your primary care provider or the provided orthopedic physician (bone specialist).   If you do not have a primary care doctor -- see below for referral information.   Return instructions:   Please return if your toes are numb or tingling, appear gray or blue, or you have severe pain (also elevate leg and loosen splint or wrap if you were given one)  Please return to the Emergency Department if you experience worsening symptoms.   Please return if you have any other emergent concerns.  Additional Information:  Your vital signs today were: BP 127/80   Pulse 95   Temp(Src) 98.4 F (36.9 C) (Oral)   Resp 16   SpO2 96%   LMP 04/03/2013 If your blood pressure (BP) was elevated above 135/85 this visit, please have this repeated by your doctor within one month. --------------  Emergency Department Resource Guide 1) Find a Doctor and Pay Out of Pocket Although you won't have to find out who is  covered by your insurance plan, it is a good idea to ask around and get recommendations. You will then need to call the office and see if the doctor you have chosen will accept you as a new patient and what types of options they offer for patients who are self-pay. Some doctors offer discounts or will set up payment plans for their patients who do not have insurance, but you will need to ask so you aren't surprised when you get to your appointment.  2) Contact Your Local Health Department Not all health departments have doctors that can see patients for sick visits, but many do, so it is worth a call to see if yours does. If you don't know where your local health department is, you can check in your phone book. The CDC also has a tool to help you locate your state's health department, and many state websites also have listings of all of their local health departments.  3) Find a Walk-in Clinic If your illness is not likely to be very severe or complicated, you may want to try a walk in clinic. These are popping up all over the country in pharmacies, drugstores, and shopping centers. They're usually staffed by nurse practitioners or physician assistants that have been trained to treat common illnesses and complaints. They're usually fairly quick and inexpensive. However, if you have serious medical issues or chronic medical problems, these are probably not your best option.  No Primary Care Doctor: -  Call Health Connect at  [phone] - they can help you locate a primary care doctor that  accepts your insurance, provides certain services, etc. - Physician Referral Service- [phone]  Chronic Pain Problems: Organization         Address  Phone   Notes  [facility]  [phone] Patients need to be referred by their primary care doctor.   Medication Assistance: Organization         Address  Phone   Notes  [facility] Medication [facility] [address] [place], [place] [address] [phone] --Must be a resident of [facility] -- Must have NO insurance coverage whatsoever (no Medicaid/ Medicare, etc.) -- The pt. MUST have a primary care doctor that directs their care regularly and follows them in the community   MedAssist  [phone]   [employer]  [phone]    Agencies that provide inexpensive medical care: Organization         Address  Phone   Notes  [place]  [phone]   [facility]    [phone]   [facility] [place],  [address] [phone]   [place] [address] [place] [phone]   Planned Parenthood    [phone]   [facility]    [phone]   [place] and [place], [place] Phone:  [phone], Fax:  [phone] Hours of Operation:  9 am - 6 pm, M-F.  Also accepts Medicaid/Medicare and self-pay.  [facility] for [place] [address], [address], [place] Phone: [phone], Fax: [phone]. Hours of Operation:  8:30 am - 5:30 pm, M-F.  Also accepts Medicaid and self-pay.  [facility] [place] [address], [place] Phone: [phone]   [place], [place], [place] [phone], Ext. [phone] Mondays & Thursdays: 7-9 AM.  First 15 patients are seen on a first come, first serve basis.    [place] Providers:  Organization         Address  Phone   Notes  [facility] [address], [place] [phone] Also accepts self-pay patients.  [facility] [id number] [place], [place]  [phone]   [place], [address], [place] [phone]   [facility] [address], [place] [phone]   Lucianne Lei [address], [place]   [phone] Only accepts [place] Access [place] patients after they have their name applied to their card.   Self-Pay (no insurance) in [facility]:  Organization         Address  Phone   Notes  Sickle Cell Patients, [facility] [place] [phone]   [facility] [place] [phone]   [facility] [place]  [place], [place], [place] [phone]   [facility]/Dr. Osei-Bonsu  [address], [place] or [address], [place] [phone] Phone number for both [place] and [place] locations is the same.  Urgent Medical and [facility] [address], [place] [phone]   [place]  [address], [place] or [address] [phone]   [facility] [place] [phone], phone; [phone], fax Sees patients 1st and 3rd Saturday of every month.  Must not qualify for public or private insurance (i.e. Medicaid, Medicare, [place] Health Choice, Veterans' Benefits)  Household income should be no more than 200% of the poverty level The clinic cannot treat you if you are pregnant or think you are pregnant  Sexually transmitted diseases are not treated at the clinic.    Dental Care: Organization         Address  Phone  Notes  [facility] Department of [facility] [phone] Accepts children up to age 42 who are enrolled in [place] or [place]; pregnant women with a Medicaid card; and children who have applied for Medicaid or [place] Health Choice, but were declined, whose parents can pay a reduced fee at time of service.  [facility] Department of [facility]  [address], [place] [phone] Accepts children up to age 20 who are enrolled  in [place] or [place]; pregnant women with a Medicaid card; and children who have applied for Medicaid or [place] Health Choice, but were declined, whose parents can pay a reduced fee at time of service.  [place] Adult [facility] Access PROGRAM  [place] [phone] Patients are seen by appointment only. Walk-ins are not accepted. [place] will see patients 56 years of age and older. Monday - Tuesday (8am-5pm) Most Wednesdays (8:30-5pm) $30 per visit, cash only  [facility] Adult Dental Access PROGRAM  [address], [facility] [phone] Patients are seen by appointment only. Walk-ins are not accepted. [place] will see patients 83 years of age and older. One Wednesday Evening (Monthly: Volunteer Based).  $30 per visit, cash only  [place]  [phone] for adults; Children under age 31, call Graduate Pediatric Dentistry at [phone]. Children aged 18-14, please call [phone] to request a pediatric application.  Dental services are provided in all areas of dental care including fillings, crowns and bridges, complete and partial dentures, implants, gum treatment, root canals, and extractions. Preventive care is also provided. Treatment is provided to both adults and children. Patients are selected via a lottery and there is often a waiting list.   [facility] [address] [place]  [phone] [email or web address]   [facility] [address] [place], [place] [phone], Ext. [phone] Second and Fourth Thursday of each month, opens at 6:30 AM; Clinic ends at 9 AM.  Patients are seen on a first-come first-served basis, and a limited number are seen during each clinic.   [facility]  [address] Hillard Danker [place], [place] [phone]   Eligibility Requirements You must have lived in [place], [place], or [place] counties for at least the last three months.   You cannot be  eligible for state or federal sponsored [employer], including [employer], [place], or [employer].   You generally cannot be eligible for healthcare insurance through your employer.    How to apply: Eligibility screenings are held every Tuesday and Wednesday afternoon from 1:00 pm until 4:00 pm. You do not need an appointment for the interview!  [facility] [address] [place], [place]   [facility]  Department  [place] Department  [place]  [phone]    Behavioral Health Resources in the Community: Intensive Outpatient Programs Organization         Address  Phone  Notes  [place]. [address], [place], [place] [phone]   [facility] Outpatient [address] [place], [place]   ADS: Alcohol & Drug Svcs [address], [place], [place]   [place] [address]  [place], [place] or [phone]   Substance Abuse Resources Organization         Address  Phone  Notes  Alcohol and Drug Services  [phone]   [place]  [phone]   [facility]   Chinita Pester  [phone]   Residential & Outpatient Substance Abuse Program  [phone]   Psychological Services Organization         Address  Phone  Notes  [facility] [place]  [place]  [phone]   [place] [address] [place] or [phone]    Mobile Crisis Teams Organization         Address  Phone  Notes  [facility], Mobile Crisis Care Unit  [phone]   [facility] Psychotherapeutic Services  [address] [place], [place]   [place] [address], [place] [phone]    Self-Help/Support Groups Organization          Address  Phone             Notes  [facility] [place] - variety of support groups  [place] Call for more information  Narcotics Anonymous (NA), [facility] [address], [employer] [place]  2 meetings at this location   Special educational needs teacher         Address  Phone  Notes  [facility]idential Treatment [place],    [place]  [phone]   [facility]  [address], [place] [address]24235, [place], [place]   [place], [place] [phone] Admissions: 8am-3pm M-F  Incentives Substance [place] [address],    [place], [place] [phone]   [facility] [address] [place], [place], [place]   [facility] [address]  [place], [place]   [facility] - Intensive Outpatient [address] Kristeen [address], [place], [place]   [facility] ([place] [address]  [place], [place] [phone] or [phone]   [facility] (RTS) [address] [place], [place] Accepts Medicaid  [facility] [place] [address],  [place] [phone] Substance Abuse/Addiction Treatment   [facility] Organization         Address  Phone  Notes  [facility]  [phone]   Domenic Schwab, PhD [address] Arlis Porta [place], [place]   [phone] or [phone]   [place] Dexter Novice [place], [place] [phone]   [place] [address], [place], [place] [phone] Insurance/Medicaid/sponsorship through [employer] and Families [address]  [place], [place] [phone] [place], [place] [phone]    Dr. Adele Schilder  [phone]   [facility] Dept. 1) [address], [place] 2) [place] 3)  [place] [address], [place] [phone]  [phone]   [place] [phone] or [phone] (After Hours)

## 2013-04-19 ENCOUNTER — Ambulatory Visit: Payer: BC Managed Care – PPO | Admitting: Endocrinology

## 2013-05-04 ENCOUNTER — Ambulatory Visit: Payer: BC Managed Care – PPO | Admitting: Endocrinology

## 2013-07-05 ENCOUNTER — Other Ambulatory Visit: Payer: Self-pay | Admitting: Endocrinology

## 2013-07-09 NOTE — Progress Notes (Signed)
This encounter was created in error - please disregard.

## 2013-08-01 ENCOUNTER — Other Ambulatory Visit: Payer: Self-pay | Admitting: Endocrinology

## 2013-08-10 ENCOUNTER — Other Ambulatory Visit (INDEPENDENT_AMBULATORY_CARE_PROVIDER_SITE_OTHER): Payer: BC Managed Care – PPO

## 2013-08-10 ENCOUNTER — Encounter: Payer: Self-pay | Admitting: Endocrinology

## 2013-08-10 ENCOUNTER — Encounter: Payer: BC Managed Care – PPO | Admitting: Endocrinology

## 2013-08-10 DIAGNOSIS — IMO0002 Reserved for concepts with insufficient information to code with codable children: Secondary | ICD-10-CM

## 2013-08-10 DIAGNOSIS — E1065 Type 1 diabetes mellitus with hyperglycemia: Secondary | ICD-10-CM

## 2013-08-10 LAB — BASIC METABOLIC PANEL
BUN: 13 mg/dL (ref 6–23)
CHLORIDE: 106 meq/L (ref 96–112)
CO2: 25 meq/L (ref 19–32)
Calcium: 9.3 mg/dL (ref 8.4–10.5)
Creatinine, Ser: 0.6 mg/dL (ref 0.4–1.2)
GFR: 164.55 mL/min (ref >60.00)
Glucose, Bld: 162 mg/dL — ABNORMAL HIGH (ref 70–99)
POTASSIUM: 3.7 meq/L (ref 3.5–5.1)
SODIUM: 142 meq/L (ref 135–145)

## 2013-08-10 NOTE — Progress Notes (Deleted)
   Subjective:    [name], female    DOB: 1990-11-13, 23 y.o.   MRN: [id number]  HPI    Review of Systems     Objective:   Physical Exam        Assessment & Plan:

## 2013-08-10 NOTE — Progress Notes (Deleted)
[name], female   DOB: 10/31/1990, 23 y.o.   MRN: [id number]    Reason for Appointment : Follow up for Type 1 Diabetes  History of Present Illness           Diagnosis: Type 1 diabetes mellitus, long-standing  Past history: She had previously been on an insulin pump with fair control but stopped this because of the cost of supplies She has generally had poor control in the last couple of years at least because of noncompliance with glucose monitoring and periodically running out of insulin. Also has had significant variability in her blood sugars Records from her recent visits are not available yet  INSULIN regimen is described as: Novolog upto 15 lunch and supper;  probably covering meals with 1:6 carb ratio lantus 36 at bedtime  Recent history:  She has checked her blood sugar only sporadically and has started monitoring only in the last week or so She says she has had a lot of personal issues and stress and has not been paying attention to her diabetes management Again blood sugars are overall poorly controlled with high A1c Does not have any consistent pattern of her blood sugars, recently higher in the morning but lower about a week ago including a reading of 50 May possibly forget to take her insulin at mealtimes occasionally She thinks she is counting carbohydrates but appears to be doing her insulin doses somewhat arbitrarily    Glucose monitoring:  done times a day         Glucometer: One Touch.      Blood Glucose readings from meter download: readings before breakfast:  50-264, 11 AM 173, 4 PM 312          Hypoglycemia:  has had one low reading at 8 AM which is unexplained  Self-care: The diet that the patient has been following is: None, eating out at restaurants frequently and not paying attention to choices  Meals: 2- 3 meals per day.          Physical activity: exercise:.None           Dietician visit: Most recent:.?           Retinal exam: Most  recent:    Lab Results  Component Value Date   HGBA1C 8.7* 11/07/2012    Lab Results  Component Value Date   MICROALBUR 0.2 11/07/2012      Medication List       This list is accurate as of: 08/10/13  9:16 AM.  Always use your most recent med list.               albuterol 108 (90 BASE) MCG/ACT inhaler  Commonly known as:  PROVENTIL HFA;VENTOLIN HFA  Inhale 2 puffs into the lungs every 6 (six) hours as needed for shortness of breath.     albuterol (2.5 MG/3ML) 0.083% nebulizer solution  Commonly known as:  PROVENTIL  Take 2.5 mg by nebulization every 6 (six) hours as needed for shortness of breath.     budesonide-formoterol 160-4.5 MCG/ACT inhaler  Commonly known as:  SYMBICORT  Inhale 2 puffs into the lungs 2 (two) times daily.     EPIPEN 0.3 mg/0.3 mL Soaj injection  Generic drug:  EPINEPHrine  Inject 0.3 mg into the muscle once.     fluticasone 50 MCG/ACT nasal spray  Commonly known as:  FLONASE  Place 2 sprays into the nose daily as needed for rhinitis or allergies.  ibuprofen 800 MG tablet  Commonly known as:  ADVIL,MOTRIN  Take 1 tablet (800 mg total) by mouth 3 (three) times daily.     ibuprofen 600 MG tablet  Commonly known as:  ADVIL,MOTRIN  Take 1 tablet (600 mg total) by mouth every 6 (six) hours as needed.     ibuprofen 200 MG tablet  Commonly known as:  ADVIL,MOTRIN  Take 400 mg by mouth every 6 (six) hours as needed for pain.     Insulin Glargine 100 UNIT/ML Solostar Pen  Commonly known as:  LANTUS SOLOSTAR  Inject 36 Units into the skin daily at 10 pm.     Insulin Pen Needle 32G X 4 MM Misc  Use as directed 3 times per day     montelukast 10 MG tablet  Commonly known as:  SINGULAIR  Take 10 mg by mouth daily.     NOVOLOG FLEXPEN 100 UNIT/ML FlexPen  Generic drug:  insulin aspart  INJECT 0-20 UNITS INTO THE SKIN AS DIRECTED. SSI - CARB SCALE, 0-20 UNITS     PROBIOTIC DAILY PO  Take 1 capsule by mouth daily.        Allergies:   Allergies  Allergen Reactions  . Peanut-Containing Drug Products Swelling    Swelling of mouth, lips  . Strawberry Swelling    Swelling of mouth, lips    Past Medical History  Diagnosis Date  . Diabetes mellitus   . Asthma   . Hypertension   . NFAOZHYQ(657.8)     Past Surgical History  Procedure Laterality Date  . Wisdom tooth extraction      Family History  Problem Relation Age of Onset  . Diabetes Mother   . Hyperlipidemia Mother   . Hypertension Father   . Heart disease Father   . Heart disease Maternal Grandmother   . Heart disease Maternal Grandfather   . Hypertension Paternal Grandmother   . Cancer Paternal Grandfather     Social History:  reports that she has never smoked. She has never used smokeless tobacco. She reports that she does not drink alcohol or use illicit drugs.    Review of Systems:   No history of hypertension recently  LABS:  No visits with results within 1 Week(s) from this visit. Latest known visit with results is:  Admission on 12/06/2012, Discharged on 12/06/2012  Component Date Value Ref Range Status  . Glucose-Capillary 12/06/2012 463* 70 - 99 mg/dL Final  . WBC 12/06/2012 8.8  4.0 - 10.5 K/uL Final  . RBC 12/06/2012 4.47  3.87 - 5.11 MIL/uL Final  . Hemoglobin 12/06/2012 14.2  12.0 - 15.0 g/dL Final  . HCT 12/06/2012 42.1  36.0 - 46.0 % Final  . MCV 12/06/2012 94.2  78.0 - 100.0 fL Final  . MCH 12/06/2012 31.8  26.0 - 34.0 pg Final  . MCHC 12/06/2012 33.7  30.0 - 36.0 g/dL Final  . RDW 12/06/2012 12.6  11.5 - 15.5 % Final  . Platelets 12/06/2012 355  150 - 400 K/uL Final  . Sodium 12/06/2012 138  135 - 145 mEq/L Final  . Potassium 12/06/2012 3.9  3.5 - 5.1 mEq/L Final  . Chloride 12/06/2012 99  96 - 112 mEq/L Final  . CO2 12/06/2012 20  19 - 32 mEq/L Final  . Glucose, Bld 12/06/2012 246* 70 - 99 mg/dL Final  . BUN 12/06/2012 18  6 - 23 mg/dL Final  . Creatinine, Ser 12/06/2012 0.66  0.50 - 1.10 mg/dL Final  . Calcium  12/06/2012  10.4  8.4 - 10.5 mg/dL Final  . GFR calc non Af Amer 12/06/2012 >90  >90 mL/min Final  . GFR calc Af Amer 12/06/2012 >90  >90 mL/min Final   Comment: (NOTE)                          The eGFR has been calculated using the CKD EPI equation.                          This calculation has not been validated in all clinical situations.                          eGFR's persistently <90 mL/min signify possible Chronic Kidney                          Disease.  . Color, Urine 12/06/2012 YELLOW  YELLOW Final  . APPearance 12/06/2012 CLEAR  CLEAR Final  . Specific Gravity, Urine 12/06/2012 1.041* 1.005 - 1.030 Final  . pH 12/06/2012 6.0  5.0 - 8.0 Final  . Glucose, UA 12/06/2012 >1000* NEGATIVE mg/dL Final  . Hgb urine dipstick 12/06/2012 MODERATE* NEGATIVE Final  . Bilirubin Urine 12/06/2012 NEGATIVE  NEGATIVE Final  . Ketones, ur 12/06/2012 15* NEGATIVE mg/dL Final  . Protein, ur 12/06/2012 NEGATIVE  NEGATIVE mg/dL Final  . Urobilinogen, UA 12/06/2012 0.2  0.0 - 1.0 mg/dL Final  . Nitrite 12/06/2012 NEGATIVE  NEGATIVE Final  . Leukocytes, UA 12/06/2012 SMALL* NEGATIVE Final  . Sodium 12/06/2012 141  135 - 145 mEq/L Final  . Potassium 12/06/2012 3.4* 3.5 - 5.1 mEq/L Final  . Chloride 12/06/2012 103  96 - 112 mEq/L Final  . BUN 12/06/2012 21  6 - 23 mg/dL Final  . Creatinine, Ser 12/06/2012 0.80  0.50 - 1.10 mg/dL Final  . Glucose, Bld 12/06/2012 139* 70 - 99 mg/dL Final  . Calcium, Ion 12/06/2012 1.16  1.12 - 1.23 mmol/L Final  . TCO2 12/06/2012 23  0 - 100 mmol/L Final  . Hemoglobin 12/06/2012 12.6  12.0 - 15.0 g/dL Final  . HCT 12/06/2012 37.0  36.0 - 46.0 % Final  . Squamous Epithelial / LPF 12/06/2012 FEW* RARE Final  . WBC, UA 12/06/2012 3-6  <3 WBC/hpf Final  . RBC / HPF 12/06/2012 7-10  <3 RBC/hpf Final    Physical Examination:  BP 122/74  Pulse 101  Temp(Src) 98.3 F (36.8 C) (Oral)  Ht 5' 4"  (1.626 m)  Wt 120 lb (54.432 kg)  BMI 20.59 kg/m2  SpO2 93%          ASSESSMENT:  Diabetes type 1: She has poor control and the following problems were identified and discussed with the patient 1. Very infrequent glucose monitoring and checking mostly in the morning 2. No consistent pattern of blood sugars in the morning 3. Taking mealtime coverage somewhat arbitrarily. Discussed that she will need to try and do carbohydrate counting and for simplicity use a carbohydrate ratio of 1:5 and also adjust for high readings 4. Periodically forgetting mealtime doses 5.  poor diet and eating out frequently 6. Occasional hypoglycemia on waking up which is unexplained 7. Sometimes having difficulty affording her insulin 8. Not clear if he is on a relatively high Lantus insulin dose since morning sugars are variable. Discussed possibly reducing it back by 2- 3 units if morning sugars are more consistently  low after doing better with mealtime coverage   Counseling time over 50% of today's 25 minute visit  Albert Devaul 08/10/2013, 9:16 AM

## 2013-08-11 ENCOUNTER — Other Ambulatory Visit: Payer: Self-pay | Admitting: Endocrinology

## 2013-08-14 LAB — FRUCTOSAMINE: Fructosamine: 531 umol/L — ABNORMAL HIGH (ref 190–270)

## 2013-08-21 ENCOUNTER — Ambulatory Visit: Payer: BC Managed Care – PPO | Admitting: Endocrinology

## 2013-08-29 ENCOUNTER — Ambulatory Visit: Payer: BC Managed Care – PPO | Admitting: Endocrinology

## 2013-09-07 ENCOUNTER — Ambulatory Visit: Payer: BC Managed Care – PPO | Admitting: Endocrinology

## 2013-09-07 ENCOUNTER — Telehealth: Payer: Self-pay | Admitting: Endocrinology

## 2013-09-07 NOTE — Telephone Encounter (Signed)
Patient ask for refill on Novalog.

## 2013-09-07 NOTE — Telephone Encounter (Signed)
Denied, she needs to be seen with meter for refills

## 2013-09-19 ENCOUNTER — Telehealth: Payer: Self-pay | Admitting: Endocrinology

## 2013-09-19 ENCOUNTER — Other Ambulatory Visit: Payer: Self-pay | Admitting: *Deleted

## 2013-09-19 ENCOUNTER — Ambulatory Visit: Payer: BC Managed Care – PPO | Admitting: Endocrinology

## 2013-09-19 MED ORDER — INSULIN ASPART 100 UNIT/ML FLEXPEN: PEN_INJECTOR | SUBCUTANEOUS | Status: DC

## 2013-09-19 NOTE — Telephone Encounter (Signed)
rx was sent in for a 30 day supply

## 2013-09-19 NOTE — Telephone Encounter (Signed)
Patient would like to have her Novolog called in She has an appointment scheduled Patient was advised to make sire she brings her meter at every visit  Thank you

## 2013-10-13 ENCOUNTER — Encounter: Payer: Self-pay | Admitting: Endocrinology

## 2013-10-13 ENCOUNTER — Other Ambulatory Visit: Payer: BC Managed Care – PPO

## 2013-10-16 ENCOUNTER — Telehealth: Payer: Self-pay | Admitting: Endocrinology

## 2013-10-16 ENCOUNTER — Other Ambulatory Visit: Payer: BC Managed Care – PPO

## 2013-10-16 NOTE — Telephone Encounter (Addendum)
Patient dismissed from [facility] by Elayne Snare MD , effective October 13, 2013. Dismissal letter sent out by certified / registered mail. DAJ  Received signed domestic return receipt verifying delivery of certified letter on October 20, 2013. Article number [id number] [phone] DAJ

## 2013-10-18 ENCOUNTER — Telehealth: Payer: Self-pay | Admitting: Endocrinology

## 2013-10-18 NOTE — Telephone Encounter (Signed)
Please see below and advise.

## 2013-10-18 NOTE — Telephone Encounter (Signed)
Can we submit the 30 day supply since she has been discharged to cover her until she finds a new MD  Lantus, Accucheck test strips, novolog is needed

## 2013-10-18 NOTE — Telephone Encounter (Signed)
ok 

## 2013-10-19 ENCOUNTER — Ambulatory Visit: Payer: BC Managed Care – PPO | Admitting: Endocrinology

## 2013-10-19 ENCOUNTER — Other Ambulatory Visit: Payer: Self-pay | Admitting: *Deleted

## 2013-10-19 ENCOUNTER — Other Ambulatory Visit: Payer: BC Managed Care – PPO

## 2013-10-19 MED ORDER — INSULIN GLARGINE 100 UNIT/ML SOLOSTAR PEN: 36.0000 [IU] | PEN_INJECTOR | Freq: Every day | SUBCUTANEOUS | Status: DC

## 2013-10-19 MED ORDER — INSULIN ASPART 100 UNIT/ML FLEXPEN: PEN_INJECTOR | SUBCUTANEOUS | Status: DC

## 2013-11-07 NOTE — Progress Notes (Signed)
This encounter was created in error - please disregard.

## 2013-11-20 ENCOUNTER — Encounter: Payer: Self-pay | Admitting: Endocrinology

## 2014-01-31 DIAGNOSIS — Z794 Long term (current) use of insulin: Secondary | ICD-10-CM | POA: Insufficient documentation

## 2014-03-07 ENCOUNTER — Emergency Department (HOSPITAL_COMMUNITY)
Admission: EM | Admit: 2014-03-07 | Discharge: 2014-03-07 | Disposition: A | Discharge status: Home / Self Care | Payer: Medicaid Other | Attending: Emergency Medicine | Admitting: Emergency Medicine

## 2014-03-07 ENCOUNTER — Emergency Department (HOSPITAL_COMMUNITY): Payer: Medicaid Other

## 2014-03-07 DIAGNOSIS — Z791 Long term (current) use of non-steroidal anti-inflammatories (NSAID): Secondary | ICD-10-CM | POA: Insufficient documentation

## 2014-03-07 DIAGNOSIS — Z794 Long term (current) use of insulin: Secondary | ICD-10-CM | POA: Insufficient documentation

## 2014-03-07 DIAGNOSIS — I10 Essential (primary) hypertension: Secondary | ICD-10-CM | POA: Insufficient documentation

## 2014-03-07 DIAGNOSIS — Y998 Other external cause status: Secondary | ICD-10-CM | POA: Diagnosis not present

## 2014-03-07 DIAGNOSIS — R42 Dizziness and giddiness: Secondary | ICD-10-CM | POA: Insufficient documentation

## 2014-03-07 DIAGNOSIS — E1165 Type 2 diabetes mellitus with hyperglycemia: Secondary | ICD-10-CM | POA: Diagnosis not present

## 2014-03-07 DIAGNOSIS — Y92218 Other school as the place of occurrence of the external cause: Secondary | ICD-10-CM | POA: Insufficient documentation

## 2014-03-07 DIAGNOSIS — W1830XA Fall on same level, unspecified, initial encounter: Secondary | ICD-10-CM | POA: Diagnosis not present

## 2014-03-07 DIAGNOSIS — R200 Anesthesia of skin: Secondary | ICD-10-CM | POA: Insufficient documentation

## 2014-03-07 DIAGNOSIS — Y9389 Activity, other specified: Secondary | ICD-10-CM | POA: Diagnosis not present

## 2014-03-07 DIAGNOSIS — R000 Tachycardia, unspecified: Secondary | ICD-10-CM

## 2014-03-07 DIAGNOSIS — J45909 Unspecified asthma, uncomplicated: Secondary | ICD-10-CM | POA: Diagnosis not present

## 2014-03-07 DIAGNOSIS — R55 Syncope and collapse: Secondary | ICD-10-CM | POA: Diagnosis present

## 2014-03-07 DIAGNOSIS — Z7951 Long term (current) use of inhaled steroids: Secondary | ICD-10-CM | POA: Diagnosis not present

## 2014-03-07 DIAGNOSIS — Z3202 Encounter for pregnancy test, result negative: Secondary | ICD-10-CM | POA: Insufficient documentation

## 2014-03-07 DIAGNOSIS — R739 Hyperglycemia, unspecified: Secondary | ICD-10-CM

## 2014-03-07 LAB — URINALYSIS, ROUTINE W REFLEX MICROSCOPIC
BILIRUBIN URINE: NEGATIVE
Glucose, UA: >1000 mg/dL — AB
Hgb urine dipstick: NEGATIVE
Ketones, ur: 15 mg/dL — AB
Leukocytes, UA: NEGATIVE
NITRITE: NEGATIVE
PROTEIN: NEGATIVE mg/dL
Specific Gravity, Urine: 1.042 — ABNORMAL HIGH (ref 1.005–1.030)
UROBILINOGEN UA: 0.2 mg/dL (ref 0.0–1.0)
pH: 5 (ref 5.0–8.0)

## 2014-03-07 LAB — D-DIMER, QUANTITATIVE (NOT AT ARMC): D DIMER QUANT: 0.28 ug{FEU}/mL (ref 0.00–0.48)

## 2014-03-07 LAB — I-STAT VENOUS BLOOD GAS, ED
Acid-Base Excess: 1 mmol/L (ref 0.0–2.0)
Bicarbonate: 26.8 mEq/L — ABNORMAL HIGH (ref 20.0–24.0)
O2 SAT: 49 %
TCO2: 28 mmol/L (ref 0–100)
pCO2, Ven: 45 mmHg (ref 45.0–50.0)
pH, Ven: 7.383 — ABNORMAL HIGH (ref 7.250–7.300)
pO2, Ven: 27 mmHg — CL (ref 30.0–45.0)

## 2014-03-07 LAB — BASIC METABOLIC PANEL
ANION GAP: 16 — AB (ref 5–15)
ANION GAP: 6 (ref 5–15)
BUN: 16 mg/dL (ref 6–23)
BUN: 15 mg/dL (ref 6–23)
CALCIUM: 9.5 mg/dL (ref 8.4–10.5)
CHLORIDE: 105 mmol/L (ref 96–112)
CO2: 21 mmol/L (ref 19–32)
CO2: 25 mmol/L (ref 19–32)
Calcium: 8.4 mg/dL (ref 8.4–10.5)
Chloride: 99 mmol/L (ref 96–112)
Creatinine, Ser: 0.87 mg/dL (ref 0.50–1.10)
Creatinine, Ser: 0.73 mg/dL (ref 0.50–1.10)
GFR calc non Af Amer: >90 mL/min (ref >90)
Glucose, Bld: 253 mg/dL — ABNORMAL HIGH (ref 70–99)
Glucose, Bld: 272 mg/dL — ABNORMAL HIGH (ref 70–99)
POTASSIUM: 4.2 mmol/L (ref 3.5–5.1)
POTASSIUM: 4.1 mmol/L (ref 3.5–5.1)
SODIUM: 136 mmol/L (ref 135–145)
Sodium: 136 mmol/L (ref 135–145)

## 2014-03-07 LAB — CBC
HCT: 36.2 % (ref 36.0–46.0)
HEMOGLOBIN: 12 g/dL (ref 12.0–15.0)
MCH: 30.9 pg (ref 26.0–34.0)
MCHC: 33.1 g/dL (ref 30.0–36.0)
MCV: 93.3 fL (ref 78.0–100.0)
Platelets: 347 10*3/uL (ref 150–400)
RBC: 3.88 MIL/uL (ref 3.87–5.11)
RDW: 13.3 % (ref 11.5–15.5)
WBC: 6.3 10*3/uL (ref 4.0–10.5)

## 2014-03-07 LAB — POC URINE PREG, ED: Preg Test, Ur: NEGATIVE

## 2014-03-07 LAB — URINE MICROSCOPIC-ADD ON

## 2014-03-07 LAB — KETONES, QUALITATIVE: Acetone, Bld: NEGATIVE

## 2014-03-07 LAB — CBG MONITORING, ED: GLUCOSE-CAPILLARY: 241 mg/dL — AB (ref 70–99)

## 2014-03-07 MED ORDER — SODIUM CHLORIDE 0.9 % IV BOLUS (SEPSIS)
1000.0000 mL | Freq: Once | INTRAVENOUS | Status: AC
  Administered 2014-03-07: 1000 mL via INTRAVENOUS

## 2014-03-07 MED ORDER — SODIUM CHLORIDE 0.9 % IV BOLUS (SEPSIS): 1000.0000 mL | Freq: Once | INTRAVENOUS | Status: DC

## 2014-03-07 NOTE — ED Provider Notes (Signed)
CSN: [id number]     Arrival date & time 03/07/14  1427 History   First MD Initiated Contact with Patient 03/07/14 1525     Chief Complaint  Patient presents with  . Loss of Consciousness     (Consider location/radiation/quality/duration/timing/severity/associated sxs/prior Treatment) HPI Comments: The patient is a 24 year old female with past medical history of asthma, diabetes present emergency room chief complaint of syncope today. Patient reports feeling lightheaded while walking on a nature trail. She reports a syncopal event, witnessed lasting 2-3 minutes. Patient denies lightheadedness, visual defects. She reports persistent left sided facial numbness, decrease in sensation. No ataxia or aphasia.  No recent travel, family history or personal history of DVT/PE, lower extremity swelling, smoking, cancer, or exogenous estrogen. Denies cardiac history. Reports CBG 150 prior to eating and administration of novolog 6 units at 1100, reports compliance with Lantus 36 units at night.  Reports previous syncopal event with glucose was elevated. PCP: Coletta Memos, PA-C Endocrinologist: Ouida Sills  The history is provided by the patient. No language interpreter was used.    Past Medical History  Diagnosis Date  . Diabetes mellitus   . Asthma   . Hypertension   . UXNATFTD(322.0)    Past Surgical History  Procedure Laterality Date  . Wisdom tooth extraction     Family History  Problem Relation Age of Onset  . Diabetes Mother   . Hyperlipidemia Mother   . Hypertension Father   . Heart disease Father   . Heart disease Maternal Grandmother   . Heart disease Maternal Grandfather   . Hypertension Paternal Grandmother   . Cancer Paternal Grandfather    History  Substance Use Topics  . Smoking status: Never Smoker   . Smokeless tobacco: Never Used  . Alcohol Use: No   OB History    Gravida Para Term Preterm AB TAB SAB Ectopic Multiple Living   2 1 0 1 1 1 0 0 0 1      Review of  Systems  Constitutional: Negative for fever and chills.  Respiratory: Negative for shortness of breath.   Cardiovascular: Negative for chest pain and leg swelling.  Gastrointestinal: Negative for nausea and vomiting.  Genitourinary: Negative for dysuria and urgency.  Neurological: Positive for syncope, light-headedness and numbness. Negative for dizziness, seizures, facial asymmetry and speech difficulty.      Allergies  Peanut-containing drug products and Strawberry  Home Medications   Prior to Admission medications   Medication Sig Start Date End Date Taking? Authorizing Provider  albuterol (PROVENTIL HFA;VENTOLIN HFA) 108 (90 BASE) MCG/ACT inhaler Inhale 2 puffs into the lungs every 6 (six) hours as needed for shortness of breath.   Yes Historical Provider, MD  albuterol (PROVENTIL) (2.5 MG/3ML) 0.083% nebulizer solution Take 2.5 mg by nebulization every 6 (six) hours as needed for shortness of breath.   Yes Historical Provider, MD  budesonide-formoterol (SYMBICORT) 160-4.5 MCG/ACT inhaler Inhale 2 puffs into the lungs 2 (two) times daily.   Yes Historical Provider, MD  fluticasone (FLONASE) 50 MCG/ACT nasal spray Place 2 sprays into the nose daily as needed for rhinitis or allergies.    Yes Historical Provider, MD  insulin aspart (NOVOLOG FLEXPEN) 100 UNIT/ML FlexPen INJECT 0-20 UNITS INTO THE SKIN AS DIRECTED. SSI - CARB SCALE, 0-20 UNITS 10/19/13  Yes Elayne Snare, MD  Insulin Glargine (LANTUS SOLOSTAR) 100 UNIT/ML Solostar Pen Inject 36 Units into the skin daily at 10 pm. 10/19/13  Yes Elayne Snare, MD  Insulin Pen Needle 32G X 4 MM MISC  Use as directed 3 times per day 02/20/13  Yes Elayne Snare, MD  montelukast (SINGULAIR) 10 MG tablet Take 10 mg by mouth daily.   Yes Historical Provider, MD  Probiotic Product (PROBIOTIC DAILY PO) Take 1 capsule by mouth daily.   Yes Historical Provider, MD  EPINEPHrine (EPIPEN) 0.3 mg/0.3 mL SOAJ injection Inject 0.3 mg into the muscle once.    Historical  Provider, MD  ibuprofen (ADVIL,MOTRIN) 600 MG tablet Take 1 tablet (600 mg total) by mouth every 6 (six) hours as needed. 04/18/13   Carlisle Cater, PA-C  ibuprofen (ADVIL,MOTRIN) 800 MG tablet Take 1 tablet (800 mg total) by mouth 3 (three) times daily. 12/06/12   Hannah Muthersbaugh, PA-C   BP 117/85 mmHg  Pulse 108  Temp(Src) 99.2 F (37.3 C) (Oral)  Resp 23  SpO2 100%  LMP 02/28/2014 Physical Exam  Constitutional: She is oriented to person, place, and time. She appears well-developed and well-nourished. No distress.  HENT:  Head: Normocephalic and atraumatic.  Eyes: EOM are normal. Pupils are equal, round, and reactive to light.  Neck: Neck supple.  Cardiovascular: Regular rhythm.  Tachycardia present.   No lower extremity edema. No calf tenderness.  Pulmonary/Chest: Effort normal and breath sounds normal. She has no wheezes. She has no rales.  Abdominal: Soft. There is no tenderness. There is no rebound and no guarding.  Musculoskeletal: Normal range of motion.  Neurological: She is alert and oriented to person, place, and time. She is not disoriented. She displays no tremor. A sensory deficit is present. She exhibits normal muscle tone. Gait normal. GCS eye subscore is 4. GCS verbal subscore is 5. GCS motor subscore is 6.  Speech is clear and goal oriented, follows commands II-Visual fields were normal.   III/IV/VI-Pupils were equal and reacted. Extraocular movements were full and conjugate.  V/VII-no facial droop. Smile equal. Mild decrease in sensation to sharp and soft pressure to left lower face.  VIII-normal.   Motor: Strength 5/5 to upper and lower extremities bilaterally. Moves all 4 extremities equally. Sensory: normal sensation to upper and lower extremities. Cerebellar: Normal finger to nose bilaterally No pronator drift. Normal gait unassisted.   Skin: Skin is warm and dry. She is not diaphoretic.  Psychiatric: She has a normal mood and affect. Her behavior is normal.   Nursing note and vitals reviewed.   ED Course  Procedures (including critical care time) Labs Review Labs Reviewed  BASIC METABOLIC PANEL - Abnormal; Notable for the following:    Glucose, Bld 253 (*)    Anion gap 16 (*)    All other components within normal limits  URINALYSIS, ROUTINE W REFLEX MICROSCOPIC - Abnormal; Notable for the following:    Specific Gravity, Urine 1.042 (*)    Glucose, UA >1000 (*)    Ketones, ur 15 (*)    All other components within normal limits  URINE MICROSCOPIC-ADD ON - Abnormal; Notable for the following:    Squamous Epithelial / LPF FEW (*)    Bacteria, UA FEW (*)    All other components within normal limits  BASIC METABOLIC PANEL - Abnormal; Notable for the following:    Glucose, Bld 272 (*)    All other components within normal limits  CBG MONITORING, ED - Abnormal; Notable for the following:    Glucose-Capillary 241 (*)    All other components within normal limits  I-STAT VENOUS BLOOD GAS, ED - Abnormal; Notable for the following:    pH, Ven 7.383 (*)  pO2, Ven 27.0 (*)    Bicarbonate 26.8 (*)    All other components within normal limits  CBC  D-DIMER, QUANTITATIVE  KETONES, QUALITATIVE  POC URINE PREG, ED    Imaging Review Ct Head Wo Contrast  03/07/2014   CLINICAL DATA:  Syncope episode today at work Headache and lightheaded x 2 days Has had episode several years ago due to diabetes (Type I)No hx of ca No injury No hx of stroke or seizures  EXAM: CT HEAD WITHOUT CONTRAST  TECHNIQUE: Contiguous axial images were obtained from the base of the skull through the vertex without intravenous contrast.  COMPARISON:  06/30/2007  FINDINGS: Ventricles, cisterns and other CSF spaces are normal. There is no mass, mass effect, shift of midline structures or acute hemorrhage. There is no evidence of acute infarction. Bones and soft tissues are unremarkable.  IMPRESSION: No acute intracranial findings.   Electronically Signed   By: Marin Olp M.D.    On: 03/07/2014 16:28     EKG Interpretation   Date/Time:  Wednesday March 07 2014 14:38:05 EST Ventricular Rate:  110 PR Interval:  140 QRS Duration: 77 QT Interval:  325 QTC Calculation: 440 R Axis:   74 Text Interpretation:  Sinus tachycardia Probable left atrial enlargement  No significant change was found Confirmed by Wyvonnia Dusky  MD, STEPHEN [phone])  on 03/07/2014 3:30:57 PM      MDM   Final diagnoses:  Syncope  Hyperglycemia without ketosis  Tachycardia   Patient presents after syncopal episode today, CBG on arrival 218. Patient is tachycardic on exam and has subjective facial numbness. Labs ordered, EKG ordered. EKG shows sinus tachycardia, given tachycardia and syncope will order a d-dimer no clinical signs of DVT on exam, no risk factors.  Pt with no focal deficits, reports subjective decrease in facial sensation, Even smile, able to fully inflate cheeks without difficult.  Dr. Melanee Left also evaluated the patient during this encounter. CMP shows elevated glucose without an elevated anion gap of 16. Patient shows 15 ketones and high specific gravity glucose greater than 100. CT negative for acute findings. Discussed current lab results and CT results with the patient patient's family. Patient reports laying on left side of face during is a component and likely due to cost. Patient in no acute distress. Second fluid bolus given, patient remains tachycardic.  Repeat BMP shows anion gap of 6. I believe the syncopal episode is secondary due to elevated glucose. Discussed lab results, imaging results, and treatment plan with the patient. Return precautions given. Reports understanding and no other concerns at this time.  Patient is stable for discharge at this time.  Meds given in ED:  Medications  sodium chloride 0.9 % bolus 1,000 mL (0 mLs Intravenous Stopped 03/07/14 1814)  sodium chloride 0.9 % bolus 1,000 mL (0 mLs Intravenous Stopped 03/07/14 1841)    Discharge Medication  List as of 03/07/2014  7:22 PM          Harvie Heck, PA-C 03/07/14 2133  Ezequiel Essex, MD 03/07/14 2306

## 2014-03-07 NOTE — ED Notes (Signed)
Pt reports numbness on face. Pt reports pinprick feeling duller to right side than the left side. Pt reports face feels "uneven and numb."

## 2014-03-07 NOTE — Discharge Instructions (Signed)
Call for a follow up appointment with a Family or Primary Care Provider and Endocrinologist. Return if Symptoms worsen.   Take medication as prescribed.  Continue to monitor your blood sugar levels.  Drink plenty of fluids and a well-balanced diet.  Take your insulin as prescribed by your endocrinologist.

## 2014-03-07 NOTE — ED Provider Notes (Addendum)
Medical screening examination/treatment/procedure(s) were conducted as a shared visit with non-physician practitioner(s) and myself.  I personally evaluated the patient during the encounter.   EKG Interpretation   Date/Time:  Wednesday March 07 2014 14:38:05 EST Ventricular Rate:  110 PR Interval:  140 QRS Duration: 77 QT Interval:  325 QTC Calculation: 440 R Axis:   74 Text Interpretation:  Sinus tachycardia Probable left atrial enlargement  No significant change was found Confirmed by Wyvonnia Dusky  MD, Ahlia Lemanski [phone])  on 03/07/2014 3:30:57 PM       Type I diabetic presenting with syncopal episode. She was walking on a trail with children when she began to feel dizzy and lightheaded. She fell to the ground losing consciousness. No chest pain or shortness of breath. Sugar 218. Sinus tachycardia and EKG. Describes bilateral facial numbness left worse than right. Neuro exam otherwise nonfocal.  Oriented 3. She is tachycardic to 120s. No chest pain or shortness of breath. EKG, labs, UA, hCG, orthostatics checked d-dimer given her unexplained tachycardia  Ezequiel Essex, MD 03/07/14 [place], MD 03/07/14 [phone]

## 2014-03-07 NOTE — ED Notes (Signed)
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 Fall witnessed by coworker. LOC approx 2-69min. No seizure activity noted. Denied neck/back pain for EMS. Pt diabetic. CBG 218. 12 lead unremarkable. 20g LAC. C/o some facial numbness at present. Stroke scale neg. Awake, alert, oriented x4.

## 2014-05-10 ENCOUNTER — Telehealth: Payer: Self-pay | Admitting: Cardiovascular Disease

## 2014-05-10 NOTE — Telephone Encounter (Signed)
Received medical records from [facility]. Sent to Dr. Acie Fredrickson. 05/10/14/ss

## 2014-05-18 ENCOUNTER — Ambulatory Visit (INDEPENDENT_AMBULATORY_CARE_PROVIDER_SITE_OTHER): Payer: Self-pay | Admitting: Cardiovascular Disease

## 2014-05-18 ENCOUNTER — Encounter: Payer: Self-pay | Admitting: Cardiovascular Disease

## 2014-05-18 VITALS — BP 122/104 | HR 100 | Ht 64.0 in | Wt 132.4 lb

## 2014-05-18 DIAGNOSIS — R42 Dizziness and giddiness: Secondary | ICD-10-CM

## 2014-05-18 DIAGNOSIS — R55 Syncope and collapse: Secondary | ICD-10-CM

## 2014-05-18 NOTE — Patient Instructions (Addendum)
Medication Instructions:  Your physician recommends that you continue on your current medications as directed. Please refer to the Current Medication list given to you today.   Labwork: None  Testing/Procedures: Your physician has requested that you have an echocardiogram. Echocardiography is a painless test that uses sound waves to create images of your heart. It provides your doctor with information about the size and shape of your heart and how well your heart's chambers and valves are working. This procedure takes approximately one hour. There are no restrictions for this procedure.  Your physician has recommended that you wear an event monitor. Event monitors are medical devices that record the heart's electrical activity. Doctors most often [place] these monitors to diagnose arrhythmias. Arrhythmias are problems with the speed or rhythm of the heartbeat. The monitor is a small, portable device. You can wear one while you do your normal daily activities. This is usually used to diagnose what is causing palpitations/syncope (passing out).   Follow-Up: Your physician recommends that you schedule a follow-up appointment in: 6 weeks with Dr. Acie Fredrickson.  (You may work her in on Friday June 3)

## 2014-05-18 NOTE — Progress Notes (Signed)
Cardiology Office Note  Date:  05/18/2014   ID:  [name], DOB 30-Nov-1990, MRN [id number]  PCP:  Coletta Memos, PA-C  Cardiologist:   Acie Fredrickson Wonda Cheng, MD   Chief Complaint  Patient presents with  . Dizziness   1. Dizziness 2. Type 1 diabetes mellitus 3. Asthma    History of Present Illness: [name] is a 24 y.o. 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  She turned her head and then passed out.  Was out for several minutes.  No seizure activity.  No bowel or bladder incontinence  Has not occurred previously.  Walks regularly .  No CP  Has asthma - is occasionally short of breath She has had some episodes since then - did not last as long     Past Medical History  Diagnosis Date  . Diabetes mellitus   . Asthma   . Hypertension   . VWPVXYIA(165.5)     Past Surgical History  Procedure Laterality Date  . Wisdom tooth extraction       Current Outpatient Prescriptions  Medication Sig Dispense Refill  . albuterol (PROVENTIL HFA;VENTOLIN HFA) 108 (90 BASE) MCG/ACT inhaler Inhale 2 puffs into the lungs every 6 (six) hours as needed for shortness of breath.    [name] albuterol (PROVENTIL) (2.5 MG/3ML) 0.083% nebulizer solution Take 2.5 mg by nebulization every 6 (six) hours as needed for shortness of breath.    . budesonide-formoterol (SYMBICORT) 160-4.5 MCG/ACT inhaler Inhale 2 puffs into the lungs 2 (two) times daily.    [name] EPINEPHrine (EPIPEN) 0.3 mg/0.3 mL SOAJ injection Inject 0.3 mg into the muscle once.    . fluticasone (FLONASE) 50 MCG/ACT nasal spray Place 2 sprays into the nose daily as needed for rhinitis or allergies.     [name] glucagon 1 MG injection Inject 1 mg into the muscle as needed. FOR LOW BLOOD SUGAR    . ibuprofen (ADVIL,MOTRIN) 800 MG tablet Take 1 tablet (800 mg total) by mouth 3 (three) times daily. 21 tablet 0  . insulin aspart (NOVOLOG FLEXPEN) 100 UNIT/ML FlexPen INJECT 0-20  UNITS INTO THE SKIN AS DIRECTED. SSI - CARB SCALE, 0-20 UNITS 5 pen 0  . Insulin Glargine (LANTUS SOLOSTAR) 100 UNIT/ML Solostar Pen Inject 36 Units into the skin daily at 10 pm. 5 pen 0  . Insulin Pen Needle 32G X 4 MM MISC Use as directed 3 times per day 90 each 3  . montelukast (SINGULAIR) 10 MG tablet Take 10 mg by mouth daily.    . Probiotic Product (PROBIOTIC DAILY PO) Take 1 capsule by mouth daily.     No current facility-administered medications for this visit.    Allergies:   Peanut-containing drug products and Strawberry    Social History:  The patient  reports that she has never smoked. She has never used smokeless tobacco. She reports that she does not drink alcohol or use illicit drugs.   Family History:  The patient's family history includes Cancer in her paternal grandfather; Diabetes in her mother; Heart disease in her father, maternal grandfather, and maternal grandmother; Hyperlipidemia in her mother; Hypertension in her father and paternal grandmother.    ROS:  Please see the history of present illness.    Review of Systems: Constitutional:  denies fever, chills, diaphoresis, appetite change and fatigue.  HEENT: denies photophobia, eye pain, redness, hearing loss, ear pain, congestion, sore throat, rhinorrhea, sneezing, neck pain, neck  stiffness and tinnitus.  Respiratory: denies SOB, DOE, cough, chest tightness, and wheezing.  Cardiovascular: denies chest pain, palpitations and leg swelling.  Gastrointestinal: denies nausea, vomiting, abdominal pain, diarrhea, constipation, blood in stool.  Genitourinary: denies dysuria, urgency, frequency, hematuria, flank pain and difficulty urinating.  Musculoskeletal: denies  myalgias, back pain, joint swelling, arthralgias and gait problem.   Skin: denies pallor, rash and wound.  Neurological: denies dizziness, seizures, syncope, weakness, light-headedness, numbness and headaches.   Hematological: denies adenopathy, easy  bruising, personal or family bleeding history.  Psychiatric/ Behavioral: denies suicidal ideation, mood changes, confusion, nervousness, sleep disturbance and agitation.       All other systems are reviewed and negative.    PHYSICAL EXAM: VS:  BP 122/104 mmHg  Pulse 100  Ht 5\' 4"  (1.626 m)  Wt 132 lb 6.4 oz (60.056 kg)  BMI 22.72 kg/m2 , BMI Body mass index is 22.72 kg/(m^2). GEN: Well nourished, well developed, in no acute distress HEENT: normal Neck: no JVD, carotid bruits, or masses Cardiac: RRR; no murmurs, rubs, or gallops,no edema  Respiratory:  clear to auscultation bilaterally, normal work of breathing GI: soft, nontender, nondistended, + BS MS: no deformity or atrophy Skin: warm and dry, no rash Neuro:  Strength and sensation are intact Psych: normal   EKG:  EKG is ordered 03/07/14. . The ekg ordered today demonstrates sinus tach at 110.  No ST or T wave changes.    Recent Labs: 03/07/2014: BUN 15; Creatinine 0.73; Hemoglobin 12.0; Platelets 347; Potassium 4.1; Sodium 136    Lipid Panel No results found for: CHOL, TRIG, HDL, CHOLHDL, VLDL, LDLCALC, LDLDIRECT    Wt Readings from Last 3 Encounters:  05/18/14 132 lb 6.4 oz (60.056 kg)  08/10/13 120 lb (54.432 kg)  11/16/12 129 lb 4.8 oz (58.65 kg)      Other studies Reviewed: Additional studies/ records that were reviewed today include: . Review of the above records demonstrates:    ASSESSMENT AND PLAN:  1. Dizziness - she has sinus tachycardia at baseline. I doubt that her episodes of dizziness/syncopal or due to a cardiac etiology. We will place a 30 day event monitor on her to evaluate her for the possibility of arrhythmias. We will also get an echocardiogram to assess her LV function and chamber size. I'll see her in 6 weeks for follow-up visit.  2. Type 1 diabetes mellitus - her glucose levels have not been well-controlled. Her last hemoglobin A1c was over 10. I suspect that her symptoms of dizziness  have more to do with her diabetes and glucose management.  3. Asthma - no wheezing today    Current medicines are reviewed at length with the patient today.  The patient does not have concerns regarding medicines.  The following changes have been made:  no change  Labs/ tests ordered today include:  No orders of the defined types were placed in this encounter.     Disposition:   FU with me in 6 weeks      Lehua Flores, Wonda Cheng, MD  05/18/2014 9:01 AM    [facility] [place], [place], [place]  [address] Phone: [phone]; Fax: [phone]   [facility]  [address] [place], [place]  [address] [phone]    Fax [phone]

## 2014-05-24 ENCOUNTER — Other Ambulatory Visit: Payer: Self-pay

## 2014-05-24 ENCOUNTER — Telehealth: Payer: Self-pay | Admitting: Nurse Practitioner

## 2014-05-24 ENCOUNTER — Ambulatory Visit (HOSPITAL_COMMUNITY): Payer: Medicaid Other | Attending: Internal Medicine

## 2014-05-24 ENCOUNTER — Ambulatory Visit (INDEPENDENT_AMBULATORY_CARE_PROVIDER_SITE_OTHER): Payer: BLUE CROSS/BLUE SHIELD

## 2014-05-24 DIAGNOSIS — R42 Dizziness and giddiness: Secondary | ICD-10-CM

## 2014-05-24 DIAGNOSIS — R55 Syncope and collapse: Secondary | ICD-10-CM | POA: Insufficient documentation

## 2014-05-24 NOTE — Telephone Encounter (Signed)
Notes Recorded by Emmaline Life, RN on 05/24/2014 at 5:28 PM Results reviewed with patient who verbalized understanding Notes Recorded by Thayer Headings, MD on 05/24/2014 at 5:12 PM Echo looks great

## 2014-06-22 ENCOUNTER — Ambulatory Visit: Payer: Medicaid Other | Admitting: Cardiovascular Disease

## 2014-07-10 ENCOUNTER — Telehealth: Payer: Self-pay | Admitting: Nurse Practitioner

## 2014-07-10 NOTE — Telephone Encounter (Signed)
Reported results of 30 day monitor per Dr. Acie Fredrickson:  NSR, Sinus tachycardia.  Patient states she continues to feel the fast heart rate and occasional dizziness.  I advised her to keep follow-up appointment to discuss treatment options.  She verbalized understanding and agreement.

## 2014-07-30 ENCOUNTER — Ambulatory Visit: Payer: Medicaid Other | Admitting: Physician Assistant

## 2014-09-16 ENCOUNTER — Encounter (HOSPITAL_COMMUNITY): Payer: Self-pay | Admitting: *Deleted

## 2014-09-16 ENCOUNTER — Inpatient Hospital Stay (HOSPITAL_COMMUNITY)
Admission: EM | Admit: 2014-09-16 | Discharge: 2014-09-21 | DRG: 638 | Disposition: A | Discharge status: Home / Self Care | Payer: Medicaid Other | Attending: Internal Medicine | Admitting: Internal Medicine

## 2014-09-16 DIAGNOSIS — IMO0002 Reserved for concepts with insufficient information to code with codable children: Secondary | ICD-10-CM | POA: Diagnosis present

## 2014-09-16 DIAGNOSIS — Z794 Long term (current) use of insulin: Secondary | ICD-10-CM

## 2014-09-16 DIAGNOSIS — K920 Hematemesis: Secondary | ICD-10-CM | POA: Diagnosis present

## 2014-09-16 DIAGNOSIS — Z7951 Long term (current) use of inhaled steroids: Secondary | ICD-10-CM | POA: Diagnosis not present

## 2014-09-16 DIAGNOSIS — J45909 Unspecified asthma, uncomplicated: Secondary | ICD-10-CM | POA: Diagnosis present

## 2014-09-16 DIAGNOSIS — K2970 Gastritis, unspecified, without bleeding: Secondary | ICD-10-CM | POA: Diagnosis present

## 2014-09-16 DIAGNOSIS — Z79899 Other long term (current) drug therapy: Secondary | ICD-10-CM

## 2014-09-16 DIAGNOSIS — R05 Cough: Secondary | ICD-10-CM

## 2014-09-16 DIAGNOSIS — E1010 Type 1 diabetes mellitus with ketoacidosis without coma: Secondary | ICD-10-CM | POA: Diagnosis present

## 2014-09-16 DIAGNOSIS — K449 Diaphragmatic hernia without obstruction or gangrene: Secondary | ICD-10-CM | POA: Diagnosis present

## 2014-09-16 DIAGNOSIS — Z91018 Allergy to other foods: Secondary | ICD-10-CM

## 2014-09-16 DIAGNOSIS — E1065 Type 1 diabetes mellitus with hyperglycemia: Secondary | ICD-10-CM | POA: Diagnosis present

## 2014-09-16 DIAGNOSIS — Z833 Family history of diabetes mellitus: Secondary | ICD-10-CM

## 2014-09-16 DIAGNOSIS — N179 Acute kidney failure, unspecified: Secondary | ICD-10-CM | POA: Diagnosis present

## 2014-09-16 DIAGNOSIS — R110 Nausea: Secondary | ICD-10-CM | POA: Diagnosis not present

## 2014-09-16 DIAGNOSIS — R7989 Other specified abnormal findings of blood chemistry: Secondary | ICD-10-CM | POA: Diagnosis present

## 2014-09-16 DIAGNOSIS — E1110 Type 2 diabetes mellitus with ketoacidosis without coma: Secondary | ICD-10-CM | POA: Diagnosis present

## 2014-09-16 DIAGNOSIS — I10 Essential (primary) hypertension: Secondary | ICD-10-CM | POA: Diagnosis present

## 2014-09-16 DIAGNOSIS — R000 Tachycardia, unspecified: Secondary | ICD-10-CM | POA: Diagnosis not present

## 2014-09-16 DIAGNOSIS — E0810 Diabetes mellitus due to underlying condition with ketoacidosis without coma: Secondary | ICD-10-CM | POA: Diagnosis not present

## 2014-09-16 DIAGNOSIS — Z91010 Allergy to peanuts: Secondary | ICD-10-CM

## 2014-09-16 DIAGNOSIS — R059 Cough, unspecified: Secondary | ICD-10-CM | POA: Insufficient documentation

## 2014-09-16 DIAGNOSIS — R945 Abnormal results of liver function studies: Secondary | ICD-10-CM

## 2014-09-16 DIAGNOSIS — E876 Hypokalemia: Secondary | ICD-10-CM | POA: Diagnosis present

## 2014-09-16 LAB — COMPREHENSIVE METABOLIC PANEL
ALBUMIN: 3.5 g/dL (ref 3.5–5.0)
ALT: 43 U/L (ref 14–54)
AST: 83 U/L — ABNORMAL HIGH (ref 15–41)
Alkaline Phosphatase: 168 U/L — ABNORMAL HIGH (ref 38–126)
Anion gap: 26 — ABNORMAL HIGH (ref 5–15)
BUN: 10 mg/dL (ref 6–20)
CO2: 7 mmol/L — AB (ref 22–32)
CREATININE: 1.57 mg/dL — AB (ref 0.44–1.00)
Calcium: 9 mg/dL (ref 8.9–10.3)
Chloride: 103 mmol/L (ref 101–111)
GFR calc Af Amer: 53 mL/min — ABNORMAL LOW (ref >60)
GFR calc non Af Amer: 45 mL/min — ABNORMAL LOW (ref >60)
Glucose, Bld: 547 mg/dL — ABNORMAL HIGH (ref 65–99)
POTASSIUM: 4.9 mmol/L (ref 3.5–5.1)
SODIUM: 136 mmol/L (ref 135–145)
Total Bilirubin: 2.4 mg/dL — ABNORMAL HIGH (ref 0.3–1.2)
Total Protein: 7.3 g/dL (ref 6.5–8.1)

## 2014-09-16 LAB — GLUCOSE, CAPILLARY
GLUCOSE-CAPILLARY: 241 mg/dL — AB (ref 65–99)
GLUCOSE-CAPILLARY: 183 mg/dL — AB (ref 65–99)
GLUCOSE-CAPILLARY: 165 mg/dL — AB (ref 65–99)
GLUCOSE-CAPILLARY: 177 mg/dL — AB (ref 65–99)
GLUCOSE-CAPILLARY: 177 mg/dL — AB (ref 65–99)
GLUCOSE-CAPILLARY: 182 mg/dL — AB (ref 65–99)
GLUCOSE-CAPILLARY: 167 mg/dL — AB (ref 65–99)
GLUCOSE-CAPILLARY: 143 mg/dL — AB (ref 65–99)
GLUCOSE-CAPILLARY: 167 mg/dL — AB (ref 65–99)
GLUCOSE-CAPILLARY: 163 mg/dL — AB (ref 65–99)
Glucose-Capillary: 388 mg/dL — ABNORMAL HIGH (ref 65–99)
Glucose-Capillary: 322 mg/dL — ABNORMAL HIGH (ref 65–99)
Glucose-Capillary: 190 mg/dL — ABNORMAL HIGH (ref 65–99)
Glucose-Capillary: 135 mg/dL — ABNORMAL HIGH (ref 65–99)
Glucose-Capillary: 170 mg/dL — ABNORMAL HIGH (ref 65–99)
Glucose-Capillary: 173 mg/dL — ABNORMAL HIGH (ref 65–99)

## 2014-09-16 LAB — CBC WITH DIFFERENTIAL/PLATELET
BASOS ABS: 0 10*3/uL (ref 0.0–0.1)
BASOS PCT: 0 % (ref 0–1)
EOS PCT: 2 % (ref 0–5)
Eosinophils Absolute: 0.2 10*3/uL (ref 0.0–0.7)
HCT: 45 % (ref 36.0–46.0)
Hemoglobin: 14.7 g/dL (ref 12.0–15.0)
LYMPHS PCT: 28 % (ref 12–46)
Lymphs Abs: 3.3 10*3/uL (ref 0.7–4.0)
MCH: 32 pg (ref 26.0–34.0)
MCHC: 32.7 g/dL (ref 30.0–36.0)
MCV: 97.8 fL (ref 78.0–100.0)
Monocytes Absolute: 1.1 10*3/uL — ABNORMAL HIGH (ref 0.1–1.0)
Monocytes Relative: 9 % (ref 3–12)
Neutro Abs: 7.3 10*3/uL (ref 1.7–7.7)
Neutrophils Relative %: 61 % (ref 43–77)
Platelets: 308 10*3/uL (ref 150–400)
RBC: 4.6 MIL/uL (ref 3.87–5.11)
RDW: 13.2 % (ref 11.5–15.5)
WBC: 11.9 10*3/uL — AB (ref 4.0–10.5)

## 2014-09-16 LAB — I-STAT VENOUS BLOOD GAS, ED
ACID-BASE DEFICIT: 22 mmol/L — AB (ref 0.0–2.0)
BICARBONATE: 6.8 meq/L — AB (ref 20.0–24.0)
O2 SAT: 46 %
PH VEN: 7.054 — AB (ref 7.250–7.300)
PO2 VEN: 34 mmHg (ref 30.0–45.0)
Patient temperature: 97.7
TCO2: 8 mmol/L (ref 0–100)
pCO2, Ven: 24.3 mmHg — ABNORMAL LOW (ref 45.0–50.0)

## 2014-09-16 LAB — OCCULT BLOOD GASTRIC / DUODENUM (SPECIMEN CUP): OCCULT BLOOD, GASTRIC: POSITIVE — AB

## 2014-09-16 LAB — BASIC METABOLIC PANEL
Anion gap: 18 — ABNORMAL HIGH (ref 5–15)
Anion gap: 17 — ABNORMAL HIGH (ref 5–15)
Anion gap: 12 (ref 5–15)
BUN: 8 mg/dL (ref 6–20)
BUN: 5 mg/dL — AB (ref 6–20)
BUN: 6 mg/dL (ref 6–20)
CALCIUM: 8.8 mg/dL — AB (ref 8.9–10.3)
CHLORIDE: 118 mmol/L — AB (ref 101–111)
CHLORIDE: 116 mmol/L — AB (ref 101–111)
CHLORIDE: 115 mmol/L — AB (ref 101–111)
CO2: 7 mmol/L — ABNORMAL LOW (ref 22–32)
CO2: 11 mmol/L — ABNORMAL LOW (ref 22–32)
CO2: 15 mmol/L — AB (ref 22–32)
CREATININE: 1.28 mg/dL — AB (ref 0.44–1.00)
CREATININE: 1.16 mg/dL — AB (ref 0.44–1.00)
CREATININE: 0.93 mg/dL (ref 0.44–1.00)
Calcium: 9.2 mg/dL (ref 8.9–10.3)
Calcium: 9.3 mg/dL (ref 8.9–10.3)
GFR calc Af Amer: >60 mL/min (ref >60)
GFR calc Af Amer: >60 mL/min (ref >60)
GFR calc Af Amer: >60 mL/min (ref >60)
GFR calc non Af Amer: >60 mL/min (ref >60)
GFR calc non Af Amer: >60 mL/min (ref >60)
GFR, EST NON AFRICAN AMERICAN: 58 mL/min — AB (ref >60)
Glucose, Bld: 221 mg/dL — ABNORMAL HIGH (ref 65–99)
Glucose, Bld: 221 mg/dL — ABNORMAL HIGH (ref 65–99)
Glucose, Bld: 188 mg/dL — ABNORMAL HIGH (ref 65–99)
Potassium: 4.7 mmol/L (ref 3.5–5.1)
Potassium: 4.7 mmol/L (ref 3.5–5.1)
Potassium: 4 mmol/L (ref 3.5–5.1)
SODIUM: 143 mmol/L (ref 135–145)
SODIUM: 144 mmol/L (ref 135–145)
SODIUM: 142 mmol/L (ref 135–145)

## 2014-09-16 LAB — I-STAT CHEM 8, ED
BUN: 9 mg/dL (ref 6–20)
CALCIUM ION: 1.19 mmol/L (ref 1.12–1.23)
CHLORIDE: 107 mmol/L (ref 101–111)
CREATININE: 0.5 mg/dL (ref 0.44–1.00)
GLUCOSE: 576 mg/dL — AB (ref 65–99)
HCT: 50 % — ABNORMAL HIGH (ref 36.0–46.0)
Hemoglobin: 17 g/dL — ABNORMAL HIGH (ref 12.0–15.0)
Potassium: 4.8 mmol/L (ref 3.5–5.1)
Sodium: 137 mmol/L (ref 135–145)
TCO2: 8 mmol/L (ref 0–100)

## 2014-09-16 LAB — I-STAT TROPONIN, ED: Troponin i, poc: 0 ng/mL (ref 0.00–0.08)

## 2014-09-16 LAB — SAMPLE TO BLOOD BANK

## 2014-09-16 LAB — URINE MICROSCOPIC-ADD ON

## 2014-09-16 LAB — POC OCCULT BLOOD, ED: Fecal Occult Bld: POSITIVE — AB

## 2014-09-16 LAB — URINALYSIS, ROUTINE W REFLEX MICROSCOPIC
Bilirubin Urine: NEGATIVE
Glucose, UA: >1000 mg/dL — AB
Hgb urine dipstick: NEGATIVE
Ketones, ur: >80 mg/dL — AB
Leukocytes, UA: NEGATIVE
NITRITE: NEGATIVE
PROTEIN: 30 mg/dL — AB
SPECIFIC GRAVITY, URINE: 1.023 (ref 1.005–1.030)
Urobilinogen, UA: 0.2 mg/dL (ref 0.0–1.0)
pH: 5 (ref 5.0–8.0)

## 2014-09-16 LAB — CBG MONITORING, ED
GLUCOSE-CAPILLARY: 515 mg/dL — AB (ref 65–99)
Glucose-Capillary: 473 mg/dL — ABNORMAL HIGH (ref 65–99)

## 2014-09-16 LAB — LIPASE, BLOOD: Lipase: 15 U/L — ABNORMAL LOW (ref 22–51)

## 2014-09-16 LAB — I-STAT CG4 LACTIC ACID, ED: Lactic Acid, Venous: 1.83 mmol/L (ref 0.5–2.0)

## 2014-09-16 LAB — PREGNANCY, URINE: Preg Test, Ur: NEGATIVE

## 2014-09-16 LAB — HEMOGLOBIN AND HEMATOCRIT, BLOOD
HCT: 42.4 % (ref 36.0–46.0)
HEMOGLOBIN: 13.9 g/dL (ref 12.0–15.0)

## 2014-09-16 LAB — PROTIME-INR
INR: 1.34 (ref 0.00–1.49)
PROTHROMBIN TIME: 16.7 s — AB (ref 11.6–15.2)

## 2014-09-16 MED ORDER — INSULIN ASPART 100 UNIT/ML ~~LOC~~ SOLN
10.0000 [IU] | Freq: Once | SUBCUTANEOUS | Status: DC
Start: 2014-09-16 — End: 2014-09-16

## 2014-09-16 MED ORDER — ONDANSETRON HCL 4 MG/2ML IJ SOLN
4.0000 mg | Freq: Four times a day (QID) | INTRAMUSCULAR | Status: DC | PRN
  Administered 2014-09-16 – 2014-09-20 (×8): 4 mg via INTRAVENOUS
  Filled 2014-09-16 (×8): qty 2

## 2014-09-16 MED ORDER — PANTOPRAZOLE SODIUM 40 MG IV SOLR
40.0000 mg | Freq: Two times a day (BID) | INTRAVENOUS | Status: DC
  Administered 2014-09-16 – 2014-09-21 (×11): 40 mg via INTRAVENOUS
  Filled 2014-09-16 (×15): qty 40

## 2014-09-16 MED ORDER — MONTELUKAST SODIUM 10 MG PO TABS
10.0000 mg | ORAL_TABLET | Freq: Every day | ORAL | Status: DC
  Administered 2014-09-17 – 2014-09-21 (×5): 10 mg via ORAL
  Filled 2014-09-16 (×6): qty 1

## 2014-09-16 MED ORDER — SODIUM CHLORIDE 0.9 % IV BOLUS (SEPSIS)
2000.0000 mL | Freq: Once | INTRAVENOUS | Status: AC
  Administered 2014-09-16: 2000 mL via INTRAVENOUS

## 2014-09-16 MED ORDER — ONDANSETRON HCL 4 MG/2ML IJ SOLN
4.0000 mg | Freq: Once | INTRAMUSCULAR | Status: AC | PRN
  Administered 2014-09-16: 4 mg via INTRAVENOUS
  Filled 2014-09-16: qty 2

## 2014-09-16 MED ORDER — FAMOTIDINE IN NACL 20-0.9 MG/50ML-% IV SOLN
20.0000 mg | Freq: Once | INTRAVENOUS | Status: AC
  Administered 2014-09-16: 20 mg via INTRAVENOUS
  Filled 2014-09-16: qty 50

## 2014-09-16 MED ORDER — SODIUM CHLORIDE 0.9 % IV SOLN
INTRAVENOUS | Status: DC
  Administered 2014-09-17: 2.3 [IU]/h via INTRAVENOUS
  Administered 2014-09-17: 17.8 [IU]/h via INTRAVENOUS
  Administered 2014-09-17: 5.1 [IU]/h via INTRAVENOUS
  Administered 2014-09-17: 0.9 [IU]/h via INTRAVENOUS
  Filled 2014-09-16: qty 2.5

## 2014-09-16 MED ORDER — SODIUM CHLORIDE 0.9 % IV SOLN
INTRAVENOUS | Status: DC
Start: 2014-09-16 — End: 2014-09-19
  Administered 2014-09-16: 06:00:00 via INTRAVENOUS

## 2014-09-16 MED ORDER — SODIUM CHLORIDE 0.9 % IV SOLN
INTRAVENOUS | Status: DC
  Administered 2014-09-16: 4.1 [IU]/h via INTRAVENOUS
  Filled 2014-09-16: qty 2.5

## 2014-09-16 MED ORDER — ALBUTEROL SULFATE HFA 108 (90 BASE) MCG/ACT IN AERS: 2.0000 | INHALATION_SPRAY | Freq: Four times a day (QID) | RESPIRATORY_TRACT | Status: DC | PRN

## 2014-09-16 MED ORDER — PROMETHAZINE HCL 25 MG/ML IJ SOLN
12.5000 mg | Freq: Once | INTRAMUSCULAR | Status: AC
  Administered 2014-09-16: 12.5 mg via INTRAVENOUS
  Filled 2014-09-16: qty 1

## 2014-09-16 MED ORDER — POTASSIUM CHLORIDE 10 MEQ/100ML IV SOLN
10.0000 meq | INTRAVENOUS | Status: AC
  Filled 2014-09-16 (×2): qty 100

## 2014-09-16 MED ORDER — DEXTROSE-NACL 5-0.45 % IV SOLN
INTRAVENOUS | Status: DC
  Administered 2014-09-16 (×2): via INTRAVENOUS

## 2014-09-16 MED ORDER — MORPHINE SULFATE (PF) 2 MG/ML IV SOLN
2.0000 mg | Freq: Four times a day (QID) | INTRAVENOUS | Status: DC | PRN
  Administered 2014-09-17 – 2014-09-21 (×11): 2 mg via INTRAVENOUS
  Filled 2014-09-16 (×10): qty 1

## 2014-09-16 MED ORDER — ALBUTEROL SULFATE (2.5 MG/3ML) 0.083% IN NEBU: 2.5000 mg | INHALATION_SOLUTION | Freq: Four times a day (QID) | RESPIRATORY_TRACT | Status: DC | PRN

## 2014-09-16 MED ORDER — BUDESONIDE-FORMOTEROL FUMARATE 160-4.5 MCG/ACT IN AERO
2.0000 | INHALATION_SPRAY | Freq: Two times a day (BID) | RESPIRATORY_TRACT | Status: DC
  Administered 2014-09-16 – 2014-09-19 (×5): 2 via RESPIRATORY_TRACT
  Filled 2014-09-16 (×2): qty 6

## 2014-09-16 NOTE — ED Notes (Signed)
Mom at bedside.

## 2014-09-16 NOTE — ED Notes (Signed)
CBG of 473 checked

## 2014-09-16 NOTE — ED Notes (Signed)
Dr Claudine Mouton given a copy of Chem 8 and venous blood gas results

## 2014-09-16 NOTE — ED Notes (Signed)
Attempted report 

## 2014-09-16 NOTE — H&P (Signed)
[facility] History and Physical  [name] OEU:[id number] DOB: [birth year] DOA: 09/16/2014  Referring physician: EDP PCP: Stefanie Memos, PA-C   Chief Complaint: DKA   HPI: [name] is a 24 y.o. female with h/o DM1 who presents to the ED with c/o N/V.  Vomiting was initially food stuff but ultimately turned dark and coffee ground.  No melena, no bright red blood,   Review of Systems: Systems reviewed.  As above, otherwise negative  Past Medical History  Diagnosis Date  . Diabetes mellitus   . Asthma   . Hypertension   . XVQMGQQP(619.5)    Past Surgical History  Procedure Laterality Date  . Wisdom tooth extraction     Social History:  reports that she has never smoked. She has never used smokeless tobacco. She reports that she does not drink alcohol or use illicit drugs.  Allergies  Allergen Reactions  . Peanut-Containing Drug Products Swelling    Swelling of mouth, lips  . Strawberry Swelling    Swelling of mouth, lips    Family History  Problem Relation Age of Onset  . Diabetes Mother   . Hyperlipidemia Mother   . Hypertension Father   . Heart disease Father   . Heart disease Maternal Grandmother   . Heart disease Maternal Grandfather   . Hypertension Paternal Grandmother   . Cancer Paternal Grandfather      Prior to Admission medications   Medication Sig Start Date End Date Taking? Authorizing Provider  albuterol (PROVENTIL HFA;VENTOLIN HFA) 108 (90 BASE) MCG/ACT inhaler Inhale 2 puffs into the lungs every 6 (six) hours as needed for shortness of breath.    Historical Provider, MD  albuterol (PROVENTIL) (2.5 MG/3ML) 0.083% nebulizer solution Take 2.5 mg by nebulization every 6 (six) hours as needed for shortness of breath.    Historical Provider, MD  budesonide-formoterol (SYMBICORT) 160-4.5 MCG/ACT inhaler Inhale 2 puffs into the lungs 2 (two) times daily.    Historical Provider, MD  EPINEPHrine (EPIPEN) 0.3 mg/0.3 mL SOAJ injection  Inject 0.3 mg into the muscle once.    Historical Provider, MD  fluticasone (FLONASE) 50 MCG/ACT nasal spray Place 2 sprays into the nose daily as needed for rhinitis or allergies.     Historical Provider, MD  glucagon 1 MG injection Inject 1 mg into the muscle as needed. FOR LOW BLOOD SUGAR 01/31/14 01/31/15  Historical Provider, MD  ibuprofen (ADVIL,MOTRIN) 800 MG tablet Take 1 tablet (800 mg total) by mouth 3 (three) times daily. 12/06/12   Hannah Muthersbaugh, PA-C  insulin aspart (NOVOLOG FLEXPEN) 100 UNIT/ML FlexPen INJECT 0-20 UNITS INTO THE SKIN AS DIRECTED. SSI - CARB SCALE, 0-20 UNITS 10/19/13   Elayne Snare, MD  Insulin Glargine (LANTUS SOLOSTAR) 100 UNIT/ML Solostar Pen Inject 36 Units into the skin daily at 10 pm. 10/19/13   Elayne Snare, MD  Insulin Pen Needle 32G X 4 MM MISC Use as directed 3 times per day 02/20/13   Elayne Snare, MD  montelukast (SINGULAIR) 10 MG tablet Take 10 mg by mouth daily.    Historical Provider, MD  Probiotic Product (PROBIOTIC DAILY PO) Take 1 capsule by mouth daily.    Historical Provider, MD   Physical Exam: Filed Vitals:   09/16/14 0515  BP: 129/73  Pulse: 122  Temp:   Resp: 21    BP 129/73 mmHg  Pulse 122  Temp(Src) 97.7 F (36.5 C) (Oral)  Resp 21  SpO2 100%  LMP 08/02/2014 (Approximate)  General Appearance:    Sleepy but  awakens and is oriented, no distress, appears stated age  Head:    Normocephalic, atraumatic  Eyes:    PERRL, EOMI, sclera non-icteric        Nose:   Nares without drainage or epistaxis. Mucosa, turbinates normal  Throat:   Moist mucous membranes. Oropharynx without erythema or exudate.  Neck:   Supple. No carotid bruits.  No thyromegaly.  No lymphadenopathy.   Back:     No CVA tenderness, no spinal tenderness  Lungs:     Clear to auscultation bilaterally, without wheezes, rhonchi or rales  Chest wall:    No tenderness to palpitation  Heart:    Regular rate and rhythm without murmurs, gallops, rubs  Abdomen:     Soft,  non-tender, nondistended, normal bowel sounds, no organomegaly  Genitalia:    deferred  Rectal:    deferred  Extremities:   No clubbing, cyanosis or edema.  Pulses:   2+ and symmetric all extremities  Skin:   Skin color, texture, turgor normal, no rashes or lesions  Lymph nodes:   Cervical, supraclavicular, and axillary nodes normal  Neurologic:   CNII-XII intact. Normal strength, sensation and reflexes      throughout    Labs on Admission:  Basic Metabolic Panel:  Recent Labs Lab 09/16/14 0448  NA 137  K 4.8  CL 107  GLUCOSE 576*  BUN 9  CREATININE 0.50   Liver Function Tests: No results for input(s): AST, ALT, ALKPHOS, BILITOT, PROT, ALBUMIN in the last 168 hours. No results for input(s): LIPASE, AMYLASE in the last 168 hours. No results for input(s): AMMONIA in the last 168 hours. CBC:  Recent Labs Lab 09/16/14 0420 09/16/14 0448  WBC 11.9*  --   NEUTROABS 7.3  --   HGB 14.7 17.0*  HCT 45.0 50.0*  MCV 97.8  --   PLT 308  --    Cardiac Enzymes: No results for input(s): CKTOTAL, CKMB, CKMBINDEX, TROPONINI in the last 168 hours.  BNP (last 3 results) No results for input(s): PROBNP in the last 8760 hours. CBG:  Recent Labs Lab 09/16/14 0412  GLUCAP 515*    Radiological Exams on Admission: No results found.  EKG: Independently reviewed.  Assessment/Plan Principal Problem:   DKA (diabetic ketoacidoses) Active Problems:   Uncontrolled type 1 diabetes mellitus   Hematemesis   1. DKA - 1. DKA pathway 2. 2L IVF finishing up in ED 3. Insulin gtt per pathway 4. q4h BMP checks 2. Hematemesis - 1. No further vomiting since Zofran given on arrival 2. Suspicion of mallory-weis tear given the history, vomiting due to DKA ultimately turning to coffee ground 3. Ordering Protonix 4. Repeat H/H ordered for 10:00 5. Gastroccult positive 6. SCDs only for DVT ppx 7. Holding NSAIDS 8. Needs GI consult in AM    Code Status: Full Code  Family  Communication: No family in room Disposition Plan: Admit to inpatient   Time spent: 70 min  GARDNER, JARED M. [facility] Pager [phone]  If 7AM-7PM, please contact the day team taking care of the patient [email or web address] Password [email or web address] 09/16/2014, 5:35 AM

## 2014-09-16 NOTE — ED Notes (Addendum)
Per [facility] - pt from home, c/o n/v, abd pain and migraines - pt recently started on imitrex x2 days ago - pt reports no PO intake since Thursday - pt w/ bloody/coffee ground emesis and admits to hx of GI bleed. Pt w/ CBG of 468 by EMS, pt is lethargic on arrival to department however responds to verbal stimuli. Pt administered 4mg  zofran en route d/t continued vomiting.

## 2014-09-16 NOTE — Care Management Note (Signed)
Case Management Note  Patient Details  Name: [name] MRN: [id number] Date of Birth: 01-25-1990  Subjective/Objective:                 Admitted with DKA, hx of DM1. Independent with activities of daily.   Action/Plan: Return to home when medically stable. CM to f/u with d/cdisposition.  Expected Discharge Date:       unknown           Expected Discharge Plan:  Home/Self Care  In-House Referral:     Discharge planning Services  CM Consult  Post Acute Care Choice:    Choice offered to:     DME Arranged:    DME Agency:     HH Arranged:    HH Agency:     Status of Service:  In process, will continue to follow  Medicare Important Message Given:    Date Medicare IM Given:    Medicare IM give by:    Date Additional Medicare IM Given:    Additional Medicare Important Message give by:     If discussed at [place] of Stay Meetings, dates discussed:    Racine Erby (Mother)  [phone] itional Comments:  Whitman Hero [place], [place] [phone] 09/16/2014, 10:04 PM

## 2014-09-16 NOTE — Progress Notes (Signed)
   [facility]                                                                              Patient Demographics  [name], is a 24 y.o. female, DOB - Jun 07, 1990, CHY:[id number]  Admit date - 09/16/2014   Admitting Physician Etta Quill, DO  Outpatient Primary MD for the patient is MICHAELS,CHASE A, PA-C  LOS - 0   Chief Complaint  Patient presents with  . Hyperglycemia  . Emesis  . Abdominal Pain      HPI on 09/16/2014 by Dr. Jennette Kettle Mackenzie Braun is a 24 y.o. female with h/o DM1 who presents to the ED with c/o N/V. Vomiting was initially food stuff but ultimately turned dark and coffee ground. No melena, no bright red blood,   Assessment & Plan   Patient admitted earlier this morning by Dr. Jennette Kettle. See H&P for details.  Diabetic ketoacidosis/Anion gap metabolic acidosis -Continue glucostabilizer and IVF -Continue to monitor BMP q4hrs -Possibly exacerbated by headache  -UA negative for infection -Gap 18, CO2 7  Acute kidney injury -Secondary to the above -Continue to monitor BMP  Nausea/vomiting -Secondary DKA -Continue antiemetics  Hematemesis -? Mallory-Weiss tear from continued vomiting due to DKA -Gastroenterology consult is appreciated -Continue PPI -EGD possibly in the next 24-48 hours electively -Hemoglobin currently stable, will continue to monitor CBC  Code Status: Full   Family Communication: None at bedside, mother via phone  Disposition Plan: Admitted.  Continue to treat DKA  Time Spent in minutes   30 minutes  Procedures  None  Consults   Gastroenterology  DVT Prophylaxis  SCDs  Bradley Handyside D.O. on 09/16/2014 at 12:51 PM  Between 7am to 7pm - Pager - [phone]  After 7pm go to [email or web address] - password [email or web address]  And look for the night coverage person covering for me after hours  [facility] Office  [phone]

## 2014-09-16 NOTE — ED Provider Notes (Signed)
CSN: [id number]     Arrival date & time 09/16/14  0400 History   First MD Initiated Contact with Patient 09/16/14 0402     Chief Complaint  Patient presents with  . Hyperglycemia  . Emesis  . Abdominal Pain     (Consider location/radiation/quality/duration/timing/severity/associated sxs/prior Treatment) HPI Comments: 24 year old female with a history of diabetes mellitus and hypertension presents to the emergency department for further evaluation of nausea and vomiting. Patient also complaining of abdominal pain. Symptoms began yesterday. Patient reports progressive emesis since onset of symptoms. She states that her emesis has now become dark and coffee ground like. Patient states that she has been compliant with her insulin regimen. CBG 468 with EMS. Patient given 4 mg Zofran en route. She states that this did not help her nausea very much. She has no complaints of chest pain, shortness of breath, dysuria, melanotic, or hematochezia. No recent fevers.  Patient is a 24 y.o. female presenting with hyperglycemia, vomiting, and abdominal pain. The history is provided by the patient. No language interpreter was used.  Hyperglycemia Associated symptoms: abdominal pain, fatigue, nausea, vomiting and weakness (generalized)   Associated symptoms: no chest pain, no fever and no shortness of breath   Emesis Associated symptoms: abdominal pain   Associated symptoms: no diarrhea   Abdominal Pain Associated symptoms: fatigue, nausea and vomiting   Associated symptoms: no chest pain, no diarrhea, no fever and no shortness of breath     Past Medical History  Diagnosis Date  . Diabetes mellitus   . Asthma   . Hypertension   . ZSWFUXNA(355.7)    Past Surgical History  Procedure Laterality Date  . Wisdom tooth extraction     Family History  Problem Relation Age of Onset  . Diabetes Mother   . Hyperlipidemia Mother   . Hypertension Father   . Heart disease Father   . Heart disease Maternal  Grandmother   . Heart disease Maternal Grandfather   . Hypertension Paternal Grandmother   . Cancer Paternal Grandfather    Social History  Substance Use Topics  . Smoking status: Never Smoker   . Smokeless tobacco: Never Used  . Alcohol Use: No   OB History    Gravida Para Term Preterm AB TAB SAB Ectopic Multiple Living   2 1 0 1 1 1 0 0 0 1       Review of Systems  Constitutional: Positive for fatigue. Negative for fever.  Respiratory: Negative for shortness of breath.   Cardiovascular: Negative for chest pain.  Gastrointestinal: Positive for nausea, vomiting and abdominal pain. Negative for diarrhea and blood in stool.  Neurological: Positive for weakness (generalized).  All other systems reviewed and are negative.   Allergies  Peanut-containing drug products and Strawberry  Home Medications   Prior to Admission medications   Medication Sig Start Date End Date Taking? Authorizing Provider  albuterol (PROVENTIL HFA;VENTOLIN HFA) 108 (90 BASE) MCG/ACT inhaler Inhale 2 puffs into the lungs every 6 (six) hours as needed for shortness of breath.    Historical Provider, MD  albuterol (PROVENTIL) (2.5 MG/3ML) 0.083% nebulizer solution Take 2.5 mg by nebulization every 6 (six) hours as needed for shortness of breath.    Historical Provider, MD  budesonide-formoterol (SYMBICORT) 160-4.5 MCG/ACT inhaler Inhale 2 puffs into the lungs 2 (two) times daily.    Historical Provider, MD  EPINEPHrine (EPIPEN) 0.3 mg/0.3 mL SOAJ injection Inject 0.3 mg into the muscle once.    Historical Provider, MD  fluticasone (FLONASE) 50  MCG/ACT nasal spray Place 2 sprays into the nose daily as needed for rhinitis or allergies.     Historical Provider, MD  glucagon 1 MG injection Inject 1 mg into the muscle as needed. FOR LOW BLOOD SUGAR 01/31/14 01/31/15  Historical Provider, MD  ibuprofen (ADVIL,MOTRIN) 800 MG tablet Take 1 tablet (800 mg total) by mouth 3 (three) times daily. 12/06/12   Hannah  Muthersbaugh, PA-C  insulin aspart (NOVOLOG FLEXPEN) 100 UNIT/ML FlexPen INJECT 0-20 UNITS INTO THE SKIN AS DIRECTED. SSI - CARB SCALE, 0-20 UNITS 10/19/13   Elayne Snare, MD  Insulin Glargine (LANTUS SOLOSTAR) 100 UNIT/ML Solostar Pen Inject 36 Units into the skin daily at 10 pm. 10/19/13   Elayne Snare, MD  Insulin Pen Needle 32G X 4 MM MISC Use as directed 3 times per day 02/20/13   Elayne Snare, MD  montelukast (SINGULAIR) 10 MG tablet Take 10 mg by mouth daily.    Historical Provider, MD  Probiotic Product (PROBIOTIC DAILY PO) Take 1 capsule by mouth daily.    Historical Provider, MD   BP 115/61 mmHg  Pulse 116  Temp(Src) 97.7 F (36.5 C) (Oral)  Resp 20  SpO2 100%  LMP 08/02/2014 (Approximate)   Physical Exam  Constitutional: She is oriented to person, place, and time. She appears well-developed and well-nourished. No distress.  Patient lethargic  HENT:  Head: Normocephalic and atraumatic.  Dry mm  Eyes: Conjunctivae and EOM are normal. No scleral icterus.  Neck: Normal range of motion.  Cardiovascular: Regular rhythm.  Tachycardia present.   Tachycardia  Pulmonary/Chest: Effort normal. No respiratory distress. She has no wheezes.  Mild tachypnea noted. Lungs CTAB.  Abdominal:  Actively vomiting brown emesis; no gross blood.  Musculoskeletal: Normal range of motion.  Neurological: She is alert and oriented to person, place, and time. She exhibits normal muscle tone. Coordination normal.  GCS 15. Speech is goal oriented. Patient moving all extremities.  Skin: Skin is warm and dry. No rash noted. She is not diaphoretic. No erythema. No pallor.  Psychiatric: She has a normal mood and affect. Her behavior is normal.  Nursing note and vitals reviewed.   ED Course  Procedures (including critical care time) Labs Review Labs Reviewed  CBC WITH DIFFERENTIAL/PLATELET - Abnormal; Notable for the following:    WBC 11.9 (*)    Monocytes Absolute 1.1 (*)    All other components within  normal limits  PROTIME-INR - Abnormal; Notable for the following:    Prothrombin Time 16.7 (*)    All other components within normal limits  CBG MONITORING, ED - Abnormal; Notable for the following:    Glucose-Capillary 515 (*)    All other components within normal limits  POC OCCULT BLOOD, ED - Abnormal; Notable for the following:    Fecal Occult Bld POSITIVE (*)    All other components within normal limits  I-STAT CHEM 8, ED - Abnormal; Notable for the following:    Glucose, Bld 576 (*)    Hemoglobin 17.0 (*)    HCT 50.0 (*)    All other components within normal limits  I-STAT VENOUS BLOOD GAS, ED - Abnormal; Notable for the following:    pH, Ven 7.054 (*)    pCO2, Ven 24.3 (*)    Bicarbonate 6.8 (*)    Acid-base deficit 22.0 (*)    All other components within normal limits  COMPREHENSIVE METABOLIC PANEL  BLOOD GAS, VENOUS  PREGNANCY, URINE  URINALYSIS, ROUTINE W REFLEX MICROSCOPIC (NOT AT [facility])  LIPASE,  BLOOD  I-STAT CG4 LACTIC ACID, ED  POCT GASTRIC OCCULT BLOOD (1-CARD TO LAB)  I-STAT TROPOININ, ED  POCT GASTRIC OCCULT BLOOD (1-CARD TO LAB)  SAMPLE TO BLOOD BANK   0455 - Notified by nurse that gastric sample placed on hemoccult card in error. Patient is NOT heme positive from below; she denies melena and hematochezia  Imaging Review No results found.   I have personally reviewed and evaluated these images and lab results as part of my medical decision-making.   EKG Interpretation   Date/Time:  Sunday September 16 2014 04:06:53 EDT Ventricular Rate:  110 PR Interval:  131 QRS Duration: 83 QT Interval:  334 QTC Calculation: 452 R Axis:   77 Text Interpretation:  Sinus tachycardia Biatrial enlargement No  significant change since last tracing Confirmed by Glynn Octave  [id number]) on 09/16/2014 5:03:35 AM       CRITICAL CARE Performed by: Antonietta Breach   Total critical care time: 40  Critical care time was exclusive of separately billable procedures and  treating other patients.  Critical care was necessary to treat or prevent imminent or life-threatening deterioration.  Critical care was time spent personally by me on the following activities: development of treatment plan with patient and/or surrogate as well as nursing, discussions with consultants, evaluation of patient's response to treatment, examination of patient, obtaining history from patient or surrogate, ordering and performing treatments and interventions, ordering and review of laboratory studies, ordering and review of radiographic studies, pulse oximetry and re-evaluation of patient's condition.   Medications  insulin regular (NOVOLIN R,HUMULIN R) 250 Units in sodium chloride 0.9 % 250 mL (1 Units/mL) infusion (not administered)  insulin aspart (novoLOG) injection 10 Units (not administered)  sodium chloride 0.9 % bolus 2,000 mL (2,000 mLs Intravenous New Bag/Given 09/16/14 0411)  ondansetron (ZOFRAN) injection 4 mg (4 mg Intravenous Given 09/16/14 0411)  promethazine (PHENERGAN) injection 12.5 mg (12.5 mg Intravenous Given 09/16/14 0434)  famotidine (PEPCID) IVPB 20 mg premix (20 mg Intravenous New Bag/Given 09/16/14 0429)    MDM   Final diagnoses:  Diabetic ketoacidosis without coma associated with type 1 diabetes mellitus  Hematemesis with nausea    24 year old female presents to the emergency department for abdominal pain, nausea, and vomiting. Patient vomiting coffee-ground emesis on arrival. Vomiting controlled with Pepcid and Zofran. Patient denies any melanoma or hematochezia. Her gastric sample was placed on a heme occult card accidentally. A heme occult was NOT done in the emergency department. Suspect gastric occult would also be positive. Patient reports progression to hematemesis; symptoms thought to be secondary to PUD vs mallory weiss tear from persistent emesis.  Laboratory work up c/w DKA. Patient given 10mg  IV insulin and started on glucose stabilizer. Will  admit to Step Down. Case discussed with Dr. Alcario Drought who will admit.   Filed Vitals:   09/16/14 0426 09/16/14 0445 09/16/14 0500  BP: 141/90 104/57 115/61  Pulse: 113 116 116  Temp: 97.7 F (36.5 C)    TempSrc: Oral    Resp: 20 21 20   SpO2: 100% 100% 100%       Antonietta Breach, PA-C 09/16/14 3300  Everlene Balls, MD 09/16/14 [phone]

## 2014-09-16 NOTE — Consult Note (Signed)
[facility] CONSULT Reason for consult: Hematemesis Referring Physician: [facility] PCP: Vicenta Aly, NP. Primary G.I.: patient unassigned   [name] is an 24 y.o. female.  HPI: she That has a fairly long history of type I diabetes. She was admitted to the emergency room with DKA, abdominal pain nausea and vomiting. Initially the vomiting was food material and greenish gastric liquid but she did have some coffee ground material area she's had no melena or hematochezia. Her vital signs stable. She is not real cooperative and giving the history but states that she has not had previous ulcers and has never had EGD. She did have a negative gallbladder ultrasound as a teenager.  Past Medical History  Diagnosis Date  . Diabetes mellitus   . Asthma   . Hypertension   . LZJQBHAL(937.9)     Past Surgical History  Procedure Laterality Date  . Wisdom tooth extraction      Family History  Problem Relation Age of Onset  . Diabetes Mother   . Hyperlipidemia Mother   . Hypertension Father   . Heart disease Father   . Heart disease Maternal Grandmother   . Heart disease Maternal Grandfather   . Hypertension Paternal Grandmother   . Cancer Paternal Grandfather     Social History:  reports that she has never smoked. She has never used smokeless tobacco. She reports that she does not drink alcohol or use illicit drugs.  Allergies:  Allergies  Allergen Reactions  . Peanut-Containing Drug Products Swelling    Swelling of mouth, lips  . Strawberry Swelling    Swelling of mouth, lips    Medications; Prior to Admission medications   Medication Sig Start Date End Date Taking? Authorizing Provider  budesonide-formoterol (SYMBICORT) 160-4.5 MCG/ACT inhaler Inhale 2 puffs into the lungs 2 (two) times daily.   Yes Historical Provider, MD  fluticasone (FLONASE) 50 MCG/ACT nasal spray Place 2 sprays into the nose daily as needed for rhinitis or allergies.    Yes  Historical Provider, MD  insulin aspart (NOVOLOG FLEXPEN) 100 UNIT/ML FlexPen INJECT 0-20 UNITS INTO THE SKIN AS DIRECTED. SSI - CARB SCALE, 0-20 UNITS 10/19/13  Yes Elayne Snare, MD  Insulin Glargine (LANTUS SOLOSTAR) 100 UNIT/ML Solostar Pen Inject 36 Units into the skin daily at 10 pm. 10/19/13  Yes Elayne Snare, MD  montelukast (SINGULAIR) 10 MG tablet Take 10 mg by mouth daily.   Yes Historical Provider, MD  Probiotic Product (PROBIOTIC DAILY PO) Take 1 capsule by mouth daily.   Yes Historical Provider, MD  albuterol (PROVENTIL HFA;VENTOLIN HFA) 108 (90 BASE) MCG/ACT inhaler Inhale 2 puffs into the lungs every 6 (six) hours as needed for shortness of breath.    Historical Provider, MD  albuterol (PROVENTIL) (2.5 MG/3ML) 0.083% nebulizer solution Take 2.5 mg by nebulization every 6 (six) hours as needed for shortness of breath.    Historical Provider, MD  EPINEPHrine (EPIPEN) 0.3 mg/0.3 mL SOAJ injection Inject 0.3 mg into the muscle once.    Historical Provider, MD  glucagon 1 MG injection Inject 1 mg into the muscle as needed. FOR LOW BLOOD SUGAR 01/31/14 01/31/15  Historical Provider, MD  ibuprofen (ADVIL,MOTRIN) 800 MG tablet Take 1 tablet (800 mg total) by mouth 3 (three) times daily. Patient not taking: Reported on 09/16/2014 12/06/12   Jarrett Soho Muthersbaugh, PA-C  Insulin Pen Needle 32G X 4 MM MISC Use as directed 3 times per day 02/20/13   Elayne Snare, MD   . budesonide-formoterol  2 puff Inhalation  BID  . montelukast  10 mg Oral Daily  . pantoprazole (PROTONIX) IV  40 mg Intravenous Q12H   PRN Meds albuterol, morphine injection, ondansetron (ZOFRAN) IV Results for orders placed or performed during the hospital encounter of 09/16/14 (from the past 48 hour(s))  CBG monitoring, ED     Status: Abnormal   Collection Time: 09/16/14  4:12 AM  Result Value Ref Range   Glucose-Capillary 515 (H) 65 - 99 mg/dL  CBC with Differential     Status: Abnormal   Collection Time: 09/16/14  4:20 AM  Result  Value Ref Range   WBC 11.9 (H) 4.0 - 10.5 K/uL   RBC 4.60 3.87 - 5.11 MIL/uL   Hemoglobin 14.7 12.0 - 15.0 g/dL   HCT 45.0 36.0 - 46.0 %   MCV 97.8 78.0 - 100.0 fL   MCH 32.0 26.0 - 34.0 pg   MCHC 32.7 30.0 - 36.0 g/dL   RDW 13.2 11.5 - 15.5 %   Platelets 308 150 - 400 K/uL   Neutrophils Relative % 61 43 - 77 %   Neutro Abs 7.3 1.7 - 7.7 K/uL   Lymphocytes Relative 28 12 - 46 %   Lymphs Abs 3.3 0.7 - 4.0 K/uL   Monocytes Relative 9 3 - 12 %   Monocytes Absolute 1.1 (H) 0.1 - 1.0 K/uL   Eosinophils Relative 2 0 - 5 %   Eosinophils Absolute 0.2 0.0 - 0.7 K/uL   Basophils Relative 0 0 - 1 %   Basophils Absolute 0.0 0.0 - 0.1 K/uL  Comprehensive metabolic panel     Status: Abnormal   Collection Time: 09/16/14  4:20 AM  Result Value Ref Range   Sodium 136 135 - 145 mmol/L   Potassium 4.9 3.5 - 5.1 mmol/L   Chloride 103 101 - 111 mmol/L   CO2 7 (L) 22 - 32 mmol/L   Glucose, Bld 547 (H) 65 - 99 mg/dL   BUN 10 6 - 20 mg/dL   Creatinine, Ser 1.57 (H) 0.44 - 1.00 mg/dL   Calcium 9.0 8.9 - 10.3 mg/dL   Total Protein 7.3 6.5 - 8.1 g/dL   Albumin 3.5 3.5 - 5.0 g/dL   AST 83 (H) 15 - 41 U/L   ALT 43 14 - 54 U/L   Alkaline Phosphatase 168 (H) 38 - 126 U/L   Total Bilirubin 2.4 (H) 0.3 - 1.2 mg/dL   GFR calc non Af Amer 45 (L) >60 mL/min   GFR calc Af Amer 53 (L) >60 mL/min    Comment: (NOTE) The eGFR has been calculated using the CKD EPI equation. This calculation has not been validated in all clinical situations. eGFR's persistently <60 mL/min signify possible Chronic Kidney Disease.    Anion gap 26 (H) 5 - 15    Comment: RESULT CHECKED  Protime-INR     Status: Abnormal   Collection Time: 09/16/14  4:20 AM  Result Value Ref Range   Prothrombin Time 16.7 (H) 11.6 - 15.2 seconds   INR 1.34 0.00 - 1.49  Lipase, blood     Status: Abnormal   Collection Time: 09/16/14  4:20 AM  Result Value Ref Range   Lipase 15 (L) 22 - 51 U/L  Sample to Blood Bank     Status: None   Collection  Time: 09/16/14  4:27 AM  Result Value Ref Range   Blood Bank Specimen SAMPLE AVAILABLE FOR TESTING    Sample Expiration 09/17/2014   POC occult blood, ED RN  will collect     Status: Abnormal   Collection Time: 09/16/14  4:34 AM  Result Value Ref Range   Fecal Occult Bld POSITIVE (A) NEGATIVE  I-stat troponin, ED     Status: None   Collection Time: 09/16/14  4:46 AM  Result Value Ref Range   Troponin i, poc 0.00 0.00 - 0.08 ng/mL   Comment 3            Comment: Due to the release kinetics of cTnI, a negative result within the first hours of the onset of symptoms does not rule out myocardial infarction with certainty. If myocardial infarction is still suspected, repeat the test at appropriate intervals.   I-stat chem 8, ed     Status: Abnormal   Collection Time: 09/16/14  4:48 AM  Result Value Ref Range   Sodium 137 135 - 145 mmol/L   Potassium 4.8 3.5 - 5.1 mmol/L   Chloride 107 101 - 111 mmol/L   BUN 9 6 - 20 mg/dL   Creatinine, Ser 0.50 0.44 - 1.00 mg/dL   Glucose, Bld 576 (HH) 65 - 99 mg/dL   Calcium, Ion 1.19 1.12 - 1.23 mmol/L   TCO2 8 0 - 100 mmol/L   Hemoglobin 17.0 (H) 12.0 - 15.0 g/dL   HCT 50.0 (H) 36.0 - 46.0 %   Comment NOTIFIED PHYSICIAN   I-Stat CG4 Lactic Acid, ED     Status: None   Collection Time: 09/16/14  4:49 AM  Result Value Ref Range   Lactic Acid, Venous 1.83 0.5 - 2.0 mmol/L  I-Stat venous blood gas, ED     Status: Abnormal   Collection Time: 09/16/14  4:51 AM  Result Value Ref Range   pH, Ven 7.054 (LL) 7.250 - 7.300   pCO2, Ven 24.3 (L) 45.0 - 50.0 mmHg   pO2, Ven 34.0 30.0 - 45.0 mmHg   Bicarbonate 6.8 (L) 20.0 - 24.0 mEq/L   TCO2 8 0 - 100 mmol/L   O2 Saturation 46.0 %   Acid-base deficit 22.0 (H) 0.0 - 2.0 mmol/L   Patient temperature 97.7 F    Sample type VENOUS    Comment NOTIFIED PHYSICIAN   Occult bld gastric/duodenum (cup to lab)     Status: Abnormal   Collection Time: 09/16/14  5:29 AM  Result Value Ref Range   pH, Gastric NOT  DONE    Occult Blood, Gastric POSITIVE (A) NEGATIVE  CBG monitoring, ED     Status: Abnormal   Collection Time: 09/16/14  5:43 AM  Result Value Ref Range   Glucose-Capillary 473 (H) 65 - 99 mg/dL   Comment 1 Notify RN    Comment 2 Document in Chart   Pregnancy, urine     Status: None   Collection Time: 09/16/14  5:59 AM  Result Value Ref Range   Preg Test, Ur NEGATIVE NEGATIVE    Comment:        THE SENSITIVITY OF THIS METHODOLOGY IS >20 mIU/mL.   Urinalysis, Routine w reflex microscopic (not at [facility])     Status: Abnormal   Collection Time: 09/16/14  5:59 AM  Result Value Ref Range   Color, Urine YELLOW YELLOW   APPearance CLEAR CLEAR   Specific Gravity, Urine 1.023 1.005 - 1.030   pH 5.0 5.0 - 8.0   Glucose, UA >1000 (A) NEGATIVE mg/dL   Hgb urine dipstick NEGATIVE NEGATIVE   Bilirubin Urine NEGATIVE NEGATIVE   Ketones, ur >80 (A) NEGATIVE mg/dL   Protein, ur  30 (A) NEGATIVE mg/dL   Urobilinogen, UA 0.2 0.0 - 1.0 mg/dL   Nitrite NEGATIVE NEGATIVE   Leukocytes, UA NEGATIVE NEGATIVE  Urine microscopic-add on     Status: None   Collection Time: 09/16/14  5:59 AM  Result Value Ref Range   Squamous Epithelial / LPF RARE RARE   WBC, UA 0-2 <3 WBC/hpf   RBC / HPF 0-2 <3 RBC/hpf   Urine-Other RARE YEAST   Glucose, capillary     Status: Abnormal   Collection Time: 09/16/14  7:40 AM  Result Value Ref Range   Glucose-Capillary 388 (H) 65 - 99 mg/dL   Comment 1 Notify RN    Comment 2 Document in Chart   Glucose, capillary     Status: Abnormal   Collection Time: 09/16/14  8:44 AM  Result Value Ref Range   Glucose-Capillary 322 (H) 65 - 99 mg/dL   Comment 1 Notify RN    Comment 2 Document in Chart     No results found.              Blood pressure 129/79, pulse 130, temperature 98.2 F (36.8 C), temperature source Axillary, resp. rate 24, last menstrual period 08/02/2014, SpO2 100 %.  Physical exam:   General--uncooperative African-American female who is  retching somewhat. ENT-- nonicteric Neck-- supple full range of motion Heart-- slightly tachycardic Lungs--clear Abdomen-- upper abdominal discomfort but bowel sounds are normal Psych-- alert and oriented, uncooperative   Assessment: 1. Hematemesis. Patient is having severe nausea and vomiting probably due to her DKA. She's not currently having any ongoing G.I. bleeding area 2. DKA  Plan: 1. Would continue empiric PPI therapy as you are. Would go ahead and aggressively treat her DKA reevaluate in 1 to 2 days consider elective EGD at that time.   Reniyah Gootee JR,Kathleene Bergemann L 09/16/2014, 9:22 AM   Pager: [phone] If no answer or after hours call [phone]

## 2014-09-17 DIAGNOSIS — E876 Hypokalemia: Secondary | ICD-10-CM

## 2014-09-17 DIAGNOSIS — R000 Tachycardia, unspecified: Secondary | ICD-10-CM

## 2014-09-17 DIAGNOSIS — N179 Acute kidney failure, unspecified: Secondary | ICD-10-CM

## 2014-09-17 LAB — GLUCOSE, CAPILLARY
GLUCOSE-CAPILLARY: 140 mg/dL — AB (ref 65–99)
GLUCOSE-CAPILLARY: 143 mg/dL — AB (ref 65–99)
GLUCOSE-CAPILLARY: 148 mg/dL — AB (ref 65–99)
GLUCOSE-CAPILLARY: 150 mg/dL — AB (ref 65–99)
GLUCOSE-CAPILLARY: 153 mg/dL — AB (ref 65–99)
GLUCOSE-CAPILLARY: 156 mg/dL — AB (ref 65–99)
GLUCOSE-CAPILLARY: 193 mg/dL — AB (ref 65–99)
Glucose-Capillary: 144 mg/dL — ABNORMAL HIGH (ref 65–99)
Glucose-Capillary: 145 mg/dL — ABNORMAL HIGH (ref 65–99)
Glucose-Capillary: 143 mg/dL — ABNORMAL HIGH (ref 65–99)
Glucose-Capillary: 165 mg/dL — ABNORMAL HIGH (ref 65–99)
Glucose-Capillary: 145 mg/dL — ABNORMAL HIGH (ref 65–99)
Glucose-Capillary: 136 mg/dL — ABNORMAL HIGH (ref 65–99)
Glucose-Capillary: 178 mg/dL — ABNORMAL HIGH (ref 65–99)
Glucose-Capillary: 105 mg/dL — ABNORMAL HIGH (ref 65–99)
Glucose-Capillary: 229 mg/dL — ABNORMAL HIGH (ref 65–99)
Glucose-Capillary: 148 mg/dL — ABNORMAL HIGH (ref 65–99)
Glucose-Capillary: 157 mg/dL — ABNORMAL HIGH (ref 65–99)
Glucose-Capillary: 126 mg/dL — ABNORMAL HIGH (ref 65–99)
Glucose-Capillary: 157 mg/dL — ABNORMAL HIGH (ref 65–99)
Glucose-Capillary: 165 mg/dL — ABNORMAL HIGH (ref 65–99)

## 2014-09-17 LAB — BASIC METABOLIC PANEL
ANION GAP: 11 (ref 5–15)
Anion gap: 12 (ref 5–15)
Anion gap: 9 (ref 5–15)
Anion gap: 7 (ref 5–15)
Anion gap: 10 (ref 5–15)
Anion gap: 8 (ref 5–15)
BUN: 5 mg/dL — ABNORMAL LOW (ref 6–20)
BUN: 6 mg/dL (ref 6–20)
BUN: 6 mg/dL (ref 6–20)
BUN: 6 mg/dL (ref 6–20)
BUN: 7 mg/dL (ref 6–20)
BUN: 7 mg/dL (ref 6–20)
CALCIUM: 9.6 mg/dL (ref 8.9–10.3)
CALCIUM: 9.2 mg/dL (ref 8.9–10.3)
CALCIUM: 9.2 mg/dL (ref 8.9–10.3)
CALCIUM: 9 mg/dL (ref 8.9–10.3)
CHLORIDE: 112 mmol/L — AB (ref 101–111)
CHLORIDE: 113 mmol/L — AB (ref 101–111)
CO2: 17 mmol/L — AB (ref 22–32)
CO2: 17 mmol/L — AB (ref 22–32)
CO2: 18 mmol/L — ABNORMAL LOW (ref 22–32)
CO2: 20 mmol/L — ABNORMAL LOW (ref 22–32)
CO2: 20 mmol/L — ABNORMAL LOW (ref 22–32)
CO2: 21 mmol/L — AB (ref 22–32)
CREATININE: 0.87 mg/dL (ref 0.44–1.00)
CREATININE: 0.8 mg/dL (ref 0.44–1.00)
CREATININE: 0.87 mg/dL (ref 0.44–1.00)
CREATININE: 0.78 mg/dL (ref 0.44–1.00)
Calcium: 9.2 mg/dL (ref 8.9–10.3)
Calcium: 9.5 mg/dL (ref 8.9–10.3)
Chloride: 113 mmol/L — ABNORMAL HIGH (ref 101–111)
Chloride: 114 mmol/L — ABNORMAL HIGH (ref 101–111)
Chloride: 112 mmol/L — ABNORMAL HIGH (ref 101–111)
Chloride: 112 mmol/L — ABNORMAL HIGH (ref 101–111)
Creatinine, Ser: 0.82 mg/dL (ref 0.44–1.00)
Creatinine, Ser: 0.96 mg/dL (ref 0.44–1.00)
GFR calc Af Amer: >60 mL/min (ref >60)
GFR calc Af Amer: >60 mL/min (ref >60)
GFR calc non Af Amer: >60 mL/min (ref >60)
GFR calc non Af Amer: >60 mL/min (ref >60)
GFR calc non Af Amer: >60 mL/min (ref >60)
GLUCOSE: 170 mg/dL — AB (ref 65–99)
GLUCOSE: 190 mg/dL — AB (ref 65–99)
Glucose, Bld: 172 mg/dL — ABNORMAL HIGH (ref 65–99)
Glucose, Bld: 185 mg/dL — ABNORMAL HIGH (ref 65–99)
Glucose, Bld: 208 mg/dL — ABNORMAL HIGH (ref 65–99)
Glucose, Bld: 162 mg/dL — ABNORMAL HIGH (ref 65–99)
POTASSIUM: 3.5 mmol/L (ref 3.5–5.1)
POTASSIUM: 3.6 mmol/L (ref 3.5–5.1)
Potassium: 3.5 mmol/L (ref 3.5–5.1)
Potassium: 3.4 mmol/L — ABNORMAL LOW (ref 3.5–5.1)
Potassium: 5.1 mmol/L (ref 3.5–5.1)
Potassium: 3.5 mmol/L (ref 3.5–5.1)
SODIUM: 142 mmol/L (ref 135–145)
SODIUM: 141 mmol/L (ref 135–145)
SODIUM: 140 mmol/L (ref 135–145)
SODIUM: 141 mmol/L (ref 135–145)
Sodium: 140 mmol/L (ref 135–145)
Sodium: 142 mmol/L (ref 135–145)

## 2014-09-17 LAB — MRSA PCR SCREENING: MRSA BY PCR: NEGATIVE

## 2014-09-17 MED ORDER — INSULIN GLARGINE 100 UNIT/ML ~~LOC~~ SOLN
5.0000 [IU] | Freq: Once | SUBCUTANEOUS | Status: AC
  Administered 2014-09-17: 5 [IU] via SUBCUTANEOUS
  Filled 2014-09-17: qty 0.05

## 2014-09-17 MED ORDER — POTASSIUM CHLORIDE 10 MEQ/100ML IV SOLN
10.0000 meq | INTRAVENOUS | Status: AC
  Administered 2014-09-17 (×4): 10 meq via INTRAVENOUS
  Filled 2014-09-17 (×4): qty 100

## 2014-09-17 MED ORDER — INSULIN ASPART 100 UNIT/ML ~~LOC~~ SOLN
0.0000 [IU] | SUBCUTANEOUS | Status: DC
  Administered 2014-09-17: 2 [IU] via SUBCUTANEOUS
  Administered 2014-09-18: 7 [IU] via SUBCUTANEOUS
  Administered 2014-09-18: 9 [IU] via SUBCUTANEOUS

## 2014-09-17 MED ORDER — PROMETHAZINE HCL 25 MG/ML IJ SOLN
25.0000 mg | Freq: Four times a day (QID) | INTRAMUSCULAR | Status: DC | PRN
  Administered 2014-09-17: 25 mg via INTRAVENOUS
  Filled 2014-09-17: qty 1

## 2014-09-17 MED ORDER — PROMETHAZINE HCL 25 MG/ML IJ SOLN
12.5000 mg | Freq: Four times a day (QID) | INTRAMUSCULAR | Status: DC | PRN
  Administered 2014-09-17 – 2014-09-20 (×7): 12.5 mg via INTRAVENOUS
  Filled 2014-09-17 (×7): qty 1

## 2014-09-17 NOTE — Progress Notes (Signed)
Inpatient Diabetes Program Recommendations  AACE/ADA: New Consensus Statement on Inpatient Glycemic Control (2013)  Target Ranges:  Prepandial:   less than 140 mg/dL      Peak postprandial:   less than 180 mg/dL (1-2 hours)      Critically ill patients:  140 - 180 mg/dL    Results for [name], [name] (MRN [id number]) as of 09/17/2014 09:07  Ref. Range 09/17/2014 07:09  Sodium Latest Ref Range: 135-145 mmol/L 142  Potassium Latest Ref Range: 3.5-5.1 mmol/L 3.5  Chloride Latest Ref Range: 101-111 mmol/L 113 (H)  CO2 Latest Ref Range: 22-32 mmol/L 20 (L)  BUN Latest Ref Range: 6-20 mg/dL 7  Creatinine Latest Ref Range: 0.44-1.00 mg/dL 0.87  Calcium Latest Ref Range: 8.9-10.3 mg/dL 9.6  EGFR (Non-African Amer.) Latest Ref Range: >60 mL/min >60  EGFR (African American) Latest Ref Range: >60 mL/min >60  Glucose Latest Ref Range: 65-99 mg/dL 172 (H)  Anion gap Latest Ref Range: 5-15  9    Admit with: DKA (initial BMET showed glucose 547 mg/dl, Anion gap 26, CO2 7)  History: Type 1 DM  Home DM Meds: Lantus 36 units QHS        Novolog SSI + Carbohydrate Coverage  Current DM Orders: IV insulin drip per DKA protocol     -AM BMET today shows significant improvement.  Anion gap 9 and CO2 up to 20 this AM.  -Patient likely ready to transition off IV insulin drip.    MD- When you decide to transition patient off IV insulin drip to SQ insulin, please consider the following:  1. Start patient's home dose of Lantus 36 units daily.  Give 1st dose Lantus, continue IV insulin drip for at least 1 hour after Lantus given and then d/c IV insulin drip.  2. Start Novolog Sensitive SSI (0-9 units) TID AC + HS immediately upon d/c of IV insulin drip  3. Start Novolog Meal Coverage as well if patient has appetite and is ready to eat.  Could start with Novolog 4 units tid with meals      Will follow Wyn Quaker RN, MSN, CDE Diabetes Coordinator Inpatient Glycemic Control  Team Team Pager: [phone] (8a-5p)

## 2014-09-17 NOTE — Progress Notes (Signed)
Subjective: Still with nausea and vomiting.  Objective: Vital signs in last 24 hours: Temp:  [97.8 F (36.6 C)-98.5 F (36.9 C)] 97.8 F (36.6 C) (08/29 0742) Pulse Rate:  [118-121] 118 (08/29 0854) Resp:  [17-21] 21 (08/29 0854) BP: (120-133)/(76-98) 120/91 mmHg (08/29 0854) SpO2:  [97 %-100 %] 98 % (08/29 0854) Weight:  [49.5 kg (109 lb 2 oz)] 49.5 kg (109 lb 2 oz) (08/29 0000) Weight change:  Last BM Date: 09/16/14  PE: GEN:  Nauseated, vomiting thin brown liquid into emesis bag  Lab Results: CBC    Component Value Date/Time   WBC 11.9* 09/16/2014 0420   RBC 4.60 09/16/2014 0420   HGB 13.9 09/16/2014 1104   HCT 42.4 09/16/2014 1104   PLT 308 09/16/2014 0420   MCV 97.8 09/16/2014 0420   MCH 32.0 09/16/2014 0420   MCHC 32.7 09/16/2014 0420   RDW 13.2 09/16/2014 0420   LYMPHSABS 3.3 09/16/2014 0420   MONOABS 1.1* 09/16/2014 0420   EOSABS 0.2 09/16/2014 0420   BASOSABS 0.0 09/16/2014 0420   CMP     Component Value Date/Time   NA 141 09/17/2014 1047   K 3.4* 09/17/2014 1047   CL 114* 09/17/2014 1047   CO2 20* 09/17/2014 1047   GLUCOSE 185* 09/17/2014 1047   BUN 7 09/17/2014 1047   CREATININE 0.80 09/17/2014 1047   CALCIUM 9.2 09/17/2014 1047   PROT 7.3 09/16/2014 0420   ALBUMIN 3.5 09/16/2014 0420   AST 83* 09/16/2014 0420   ALT 43 09/16/2014 0420   ALKPHOS 168* 09/16/2014 0420   BILITOT 2.4* 09/16/2014 0420   GFRNONAA >60 09/17/2014 1047   GFRAA >60 09/17/2014 1047   Assessment:  1.  Nausea and vomiting.  Suspect from diabetic ketoacidosis. 2.  Hematemesis.  No bloody emesis at present. 3.  Diabetic ketoacidosis.  Plan:  1.  Keep NPO, at least sips ice chips only. 2.  Continue antiemetics. 3.  Empiric PPI. 4.  Consider EGD Wednesday, assuming ongoing treatment for DKA is successful.  There is no active GI bleeding insofar as I can tell. 5.  [facility] will follow.   Landry Dyke 09/17/2014, 1:53 PM   Pager [phone] If no answer or  after 5 PM call [phone]

## 2014-09-17 NOTE — Progress Notes (Signed)
[facility]                                                                              Patient Demographics  [name], is a 24 y.o. female, DOB - 12/23/1990, CHY:[id number]  Admit date - 09/16/2014   Admitting Physician Etta Quill, DO  Outpatient Primary MD for the patient is MICHAELS,CHASE A, PA-C  LOS - 1   Chief Complaint  Patient presents with  . Hyperglycemia  . Emesis  . Abdominal Pain      HPI on 09/16/2014 by Dr. Jennette Kettle Rodneisha Bonnet is a 24 y.o. female with h/o DM1 who presents to the ED with c/o N/V. Vomiting was initially food stuff but ultimately turned dark and coffee ground. No melena, no bright red blood,   Assessment & Plan   Diabetic ketoacidosis/Anion gap metabolic acidosis -Continue glucostabilizer and IVF, will begin to transition off of glucostabilizer once patient is feeling improved and vomiting has subsided -Continue to monitor BMP q4hrs -Possibly exacerbated by headache and not eating  -UA negative for infection -Gap closed 7, CO2 20 -Hemoglobin A1c 8.7 -Diabetes coordinator consulted and appreciated, recommended using home dose of Lantus 36 units along with 4 units insulin per meal  Acute kidney injury -Resolved.  Secondary to the above -Cr currently 0.08 -Continue to monitor BMP  Nausea/vomiting -Secondary DKA -Continue antiemetics, will add on Phenergan  Hematemesis -? Mallory-Weiss tear from continued vomiting due to DKA -Gastroenterology consult is appreciated -Continue PPI -EGD possibly in the next 24-48 hours electively -Hemoglobin currently stable, will continue to monitor CBC  Hypokalemia -Will replace and continue to monitor BMP  Tachycardia -Upon reviewing patient's chart, patient has a history of sinus tachycardia at baseline. This was seen in a note from Dr. Acie Fredrickson in April 2016.  -Per patient she has always been tachycardic, no medications  Code Status: Full  Family  Communication: None at bedside  Disposition Plan: Admitted. Continue to treat DKA  Time Spent in minutes 30 minutes  Procedures  None  Consults  Gastroenterology  DVT Prophylaxis SCDs  Lab Results  Component Value Date   PLT 308 09/16/2014    Medications  Scheduled Meds: . budesonide-formoterol  2 puff Inhalation BID  . montelukast  10 mg Oral Daily  . pantoprazole (PROTONIX) IV  40 mg Intravenous Q12H   Continuous Infusions: . sodium chloride 150 mL/hr at 09/16/14 0558  . dextrose 5 % and 0.45% NaCl 100 mL/hr at 09/16/14 2106  . insulin (NOVOLIN-R) infusion 0.9 Units/hr (09/17/14 1217)   PRN Meds:.albuterol, morphine injection, ondansetron (ZOFRAN) IV, promethazine  Antibiotics    Anti-infectives    None      Subjective:   Brooklyne Karpf seen and examined today.  Patient continues to complain of vomiting and nausea. Still has dark emesis. Denies current abdominal pain. Wishes to have some fluids or ice chips. Denies chest pain or shortness of breath, headache.  Objective:   Filed Vitals:   09/17/14 0000 09/17/14 0327 09/17/14 0742 09/17/14 0854  BP: 133/98   120/91  Pulse:    118  Temp:  98.1 F (36.7 C) 97.8 F (36.6 C)   TempSrc:  Oral Oral  Resp: 20   21  Height: 5\' 4"  (1.626 m)     Weight: 49.5 kg (109 lb 2 oz)     SpO2:  98%  98%    Wt Readings from Last 3 Encounters:  09/17/14 49.5 kg (109 lb 2 oz)  05/18/14 60.056 kg (132 lb 6.4 oz)  08/10/13 54.432 kg (120 lb)     Intake/Output Summary (Last 24 hours) at 09/17/14 1316 Last data filed at 09/16/14 1400  Gross per 24 hour  Intake  105.2 ml  Output      0 ml  Net  105.2 ml    Exam  General: Well developed, well nourished, mild distress  HEENT: NCAT,  mucous membranes moist.   Cardiovascular: S1 S2 auscultated, no rubs, murmurs or gallops. Tachycardic  Respiratory: Clear to auscultation   Abdomen: Soft, nontender, nondistended, + bowel sounds  Extremities: warm dry  without cyanosis clubbing or edema  Neuro: AAOx3, nonfocal  Psych: Normal affect and demeanor   Data Review   Micro Results No results found for this or any previous visit (from the past 240 hour(s)).  Radiology Reports No results found.  CBC  Recent Labs Lab 09/16/14 0420 09/16/14 0448 09/16/14 1104  WBC 11.9*  --   --   HGB 14.7 17.0* 13.9  HCT 45.0 50.0* 42.4  PLT 308  --   --   MCV 97.8  --   --   MCH 32.0  --   --   MCHC 32.7  --   --   RDW 13.2  --   --   LYMPHSABS 3.3  --   --   MONOABS 1.1*  --   --   EOSABS 0.2  --   --   BASOSABS 0.0  --   --     Chemistries   Recent Labs Lab 09/16/14 0420  09/16/14 1915 09/16/14 2330 09/17/14 0217 09/17/14 0709 09/17/14 1047  NA 136  < > 142 140 142 142 141  K 4.9  < > 4.0 3.6 3.5 3.5 3.4*  CL 103  < > 115* 112* 113* 113* 114*  CO2 7*  < > 15* 17* 17* 20* 20*  GLUCOSE 547*  < > 188* 190* 170* 172* 185*  BUN 10  < > 6 5* 6 7 7   CREATININE 1.57*  < > 0.93 0.96 0.82 0.87 0.80  CALCIUM 9.0  < > 9.3 9.2 9.5 9.6 9.2  AST 83*  --   --   --   --   --   --   ALT 43  --   --   --   --   --   --   ALKPHOS 168*  --   --   --   --   --   --   BILITOT 2.4*  --   --   --   --   --   --   < > = values in this interval not displayed. ------------------------------------------------------------------------------------------------------------------ estimated creatinine clearance is 84.7 mL/min (by C-G formula based on Cr of 0.8). ------------------------------------------------------------------------------------------------------------------ No results for input(s): HGBA1C in the last 72 hours. ------------------------------------------------------------------------------------------------------------------ No results for input(s): CHOL, HDL, LDLCALC, TRIG, CHOLHDL, LDLDIRECT in the last 72 hours. ------------------------------------------------------------------------------------------------------------------ No results for  input(s): TSH, T4TOTAL, T3FREE, THYROIDAB in the last 72 hours.  Invalid input(s): FREET3 ------------------------------------------------------------------------------------------------------------------ No results for input(s): VITAMINB12, FOLATE, FERRITIN, TIBC, IRON, RETICCTPCT in the last 72 hours.  Coagulation profile  Recent Labs Lab 09/16/14 0420  INR 1.34    No results for input(s): DDIMER in the last 72 hours.  Cardiac Enzymes No results for input(s): CKMB, TROPONINI, MYOGLOBIN in the last 168 hours.  Invalid input(s): CK ------------------------------------------------------------------------------------------------------------------ Invalid input(s): POCBNP    Hurman Ketelsen D.O. on 09/17/2014 at 1:16 PM  Between 7am to 7pm - Pager - [phone]  After 7pm go to [email or web address] - password [email or web address]  And look for the night coverage person covering for me after hours  [facility] Office  [phone]

## 2014-09-18 ENCOUNTER — Inpatient Hospital Stay (HOSPITAL_COMMUNITY): Payer: Medicaid Other

## 2014-09-18 DIAGNOSIS — R05 Cough: Secondary | ICD-10-CM | POA: Insufficient documentation

## 2014-09-18 DIAGNOSIS — R059 Cough, unspecified: Secondary | ICD-10-CM | POA: Insufficient documentation

## 2014-09-18 LAB — BASIC METABOLIC PANEL
ANION GAP: 9 (ref 5–15)
Anion gap: 18 — ABNORMAL HIGH (ref 5–15)
Anion gap: 8 (ref 5–15)
Anion gap: 9 (ref 5–15)
BUN: 8 mg/dL (ref 6–20)
BUN: 5 mg/dL — AB (ref 6–20)
CALCIUM: 8.5 mg/dL — AB (ref 8.9–10.3)
CALCIUM: 7.8 mg/dL — AB (ref 8.9–10.3)
CALCIUM: 8 mg/dL — AB (ref 8.9–10.3)
CALCIUM: 7.9 mg/dL — AB (ref 8.9–10.3)
CHLORIDE: 107 mmol/L (ref 101–111)
CO2: 12 mmol/L — AB (ref 22–32)
CO2: 18 mmol/L — AB (ref 22–32)
CO2: 20 mmol/L — AB (ref 22–32)
CO2: 21 mmol/L — AB (ref 22–32)
CREATININE: 1.16 mg/dL — AB (ref 0.44–1.00)
CREATININE: 0.57 mg/dL (ref 0.44–1.00)
CREATININE: 0.54 mg/dL (ref 0.44–1.00)
CREATININE: 0.53 mg/dL (ref 0.44–1.00)
Chloride: 111 mmol/L (ref 101–111)
Chloride: 107 mmol/L (ref 101–111)
Chloride: 106 mmol/L (ref 101–111)
GFR calc Af Amer: >60 mL/min (ref >60)
GFR calc Af Amer: >60 mL/min (ref >60)
GFR calc Af Amer: >60 mL/min (ref >60)
GFR calc non Af Amer: >60 mL/min (ref >60)
GFR calc non Af Amer: >60 mL/min (ref >60)
GFR calc non Af Amer: >60 mL/min (ref >60)
GLUCOSE: 198 mg/dL — AB (ref 65–99)
GLUCOSE: 175 mg/dL — AB (ref 65–99)
GLUCOSE: 157 mg/dL — AB (ref 65–99)
Glucose, Bld: 280 mg/dL — ABNORMAL HIGH (ref 65–99)
Potassium: 3.7 mmol/L (ref 3.5–5.1)
Potassium: 3.5 mmol/L (ref 3.5–5.1)
Potassium: 3.1 mmol/L — ABNORMAL LOW (ref 3.5–5.1)
Potassium: 2.8 mmol/L — ABNORMAL LOW (ref 3.5–5.1)
SODIUM: 137 mmol/L (ref 135–145)
Sodium: 137 mmol/L (ref 135–145)
Sodium: 136 mmol/L (ref 135–145)
Sodium: 136 mmol/L (ref 135–145)

## 2014-09-18 LAB — GLUCOSE, CAPILLARY
GLUCOSE-CAPILLARY: 149 mg/dL — AB (ref 65–99)
GLUCOSE-CAPILLARY: 159 mg/dL — AB (ref 65–99)
GLUCOSE-CAPILLARY: 162 mg/dL — AB (ref 65–99)
GLUCOSE-CAPILLARY: 166 mg/dL — AB (ref 65–99)
GLUCOSE-CAPILLARY: 173 mg/dL — AB (ref 65–99)
GLUCOSE-CAPILLARY: 179 mg/dL — AB (ref 65–99)
GLUCOSE-CAPILLARY: 408 mg/dL — AB (ref 65–99)
Glucose-Capillary: 122 mg/dL — ABNORMAL HIGH (ref 65–99)
Glucose-Capillary: 151 mg/dL — ABNORMAL HIGH (ref 65–99)
Glucose-Capillary: 153 mg/dL — ABNORMAL HIGH (ref 65–99)
Glucose-Capillary: 157 mg/dL — ABNORMAL HIGH (ref 65–99)
Glucose-Capillary: 171 mg/dL — ABNORMAL HIGH (ref 65–99)
Glucose-Capillary: 187 mg/dL — ABNORMAL HIGH (ref 65–99)
Glucose-Capillary: 189 mg/dL — ABNORMAL HIGH (ref 65–99)
Glucose-Capillary: 248 mg/dL — ABNORMAL HIGH (ref 65–99)
Glucose-Capillary: 346 mg/dL — ABNORMAL HIGH (ref 65–99)
Glucose-Capillary: 433 mg/dL — ABNORMAL HIGH (ref 65–99)

## 2014-09-18 LAB — CBC
HEMATOCRIT: 40.3 % (ref 36.0–46.0)
Hemoglobin: 13.4 g/dL (ref 12.0–15.0)
MCH: 31.7 pg (ref 26.0–34.0)
MCHC: 33.3 g/dL (ref 30.0–36.0)
MCV: 95.3 fL (ref 78.0–100.0)
PLATELETS: 255 10*3/uL (ref 150–400)
RBC: 4.23 MIL/uL (ref 3.87–5.11)
RDW: 13.3 % (ref 11.5–15.5)
WBC: 8.2 10*3/uL (ref 4.0–10.5)

## 2014-09-18 MED ORDER — SODIUM CHLORIDE 0.9 % IV SOLN
INTRAVENOUS | Status: AC
  Administered 2014-09-18: 10:00:00 via INTRAVENOUS

## 2014-09-18 MED ORDER — INSULIN ASPART 100 UNIT/ML ~~LOC~~ SOLN: 10.0000 [IU] | Freq: Once | SUBCUTANEOUS | Status: DC

## 2014-09-18 MED ORDER — INSULIN GLARGINE 100 UNIT/ML ~~LOC~~ SOLN
10.0000 [IU] | Freq: Once | SUBCUTANEOUS | Status: AC
  Administered 2014-09-18: 10 [IU] via SUBCUTANEOUS
  Filled 2014-09-18: qty 0.1

## 2014-09-18 MED ORDER — BENZONATATE 100 MG PO CAPS
100.0000 mg | ORAL_CAPSULE | Freq: Three times a day (TID) | ORAL | Status: DC | PRN
  Filled 2014-09-18: qty 1

## 2014-09-18 MED ORDER — POTASSIUM CHLORIDE 10 MEQ/100ML IV SOLN
10.0000 meq | INTRAVENOUS | Status: AC
  Administered 2014-09-18 (×2): 10 meq via INTRAVENOUS
  Filled 2014-09-18 (×2): qty 100

## 2014-09-18 MED ORDER — SODIUM CHLORIDE 0.9 % IV SOLN
INTRAVENOUS | Status: DC
  Administered 2014-09-18: 10:00:00 via INTRAVENOUS

## 2014-09-18 MED ORDER — DEXTROSE-NACL 5-0.45 % IV SOLN
INTRAVENOUS | Status: DC
  Administered 2014-09-18: 10:00:00 via INTRAVENOUS

## 2014-09-18 MED ORDER — INSULIN GLARGINE 100 UNIT/ML ~~LOC~~ SOLN: 30.0000 [IU] | Freq: Once | SUBCUTANEOUS | Status: DC

## 2014-09-18 MED ORDER — SODIUM CHLORIDE 0.9 % IV SOLN
INTRAVENOUS | Status: DC
  Administered 2014-09-18: 1.3 [IU]/h via INTRAVENOUS
  Filled 2014-09-18: qty 2.5

## 2014-09-18 MED ORDER — INSULIN GLARGINE 100 UNIT/ML ~~LOC~~ SOLN
20.0000 [IU] | Freq: Once | SUBCUTANEOUS | Status: AC
  Administered 2014-09-18: 20 [IU] via SUBCUTANEOUS
  Filled 2014-09-18: qty 0.2

## 2014-09-18 NOTE — Progress Notes (Addendum)
Inpatient Diabetes Program Recommendations  AACE/ADA: New Consensus Statement on Inpatient Glycemic Control (2013)  Target Ranges:  Prepandial:   less than 140 mg/dL      Peak postprandial:   less than 180 mg/dL (1-2 hours)      Critically ill patients:  140 - 180 mg/dL    Results for [name], [name] (MRN [id number]) as of 09/18/2014 09:18  Ref. Range 09/18/2014 06:47  Sodium Latest Ref Range: 135-145 mmol/L 137  Potassium Latest Ref Range: 3.5-5.1 mmol/L 3.7  Chloride Latest Ref Range: 101-111 mmol/L 107  CO2 Latest Ref Range: 22-32 mmol/L 12 (L)  BUN Latest Ref Range: 6-20 mg/dL 8  Creatinine Latest Ref Range: 0.44-1.00 mg/dL 1.16 (H)  Calcium Latest Ref Range: 8.9-10.3 mg/dL 8.5 (L)  EGFR (Non-African Amer.) Latest Ref Range: >60 mL/min >60  EGFR (African American) Latest Ref Range: >60 mL/min >60  Glucose Latest Ref Range: 65-99 mg/dL 280 (H)  Anion gap Latest Ref Range: 5-15  18 (H)     Admit with: DKA (initial BMET showed glucose 547 mg/dl, Anion gap 26, CO2 7)  History: Type 1 DM  Home DM Meds: Lantus 36 units QHS  Novolog SSI + Carbohydrate Coverage  Current DM Orders: IV insulin drip per DKA protocol    -8PM BMET last night showed CO2 of 21 and Anion Gap of 8.  Patient was transitioned off IV insulin drip but was only given 5 units Lantus at 10PM to transition.  Patient was not given enough basal insulin and she was still having issues with nausea and vomiting.  -As a result, 7am BMET today showed CO2 down to 12 and Anion Gap up to 18.  -Called Dr. Ree Kida to discuss.  Dr. Ree Kida to place orders to restart IV insulin drip with the DKA protocol.   Addendum 1300: Spoke with patient about all the treatments we are providing to her.  Discussed diagnosis of DKA, treatment, plan for transition off IV insulin drip.  Patient stated she has had DKA before.  Sees Dr. Nonda Lou with [facility] for DM management.  Patient also stated she  uses Lantus and Novolog at home.  Lantus 36 units QHS and Novolog SSI along with carbohydrate coverage.  Patient stated she usually takes about 1 unit Novolog for every 10 grams of carbohydrates she eats.    Will follow Wyn Quaker RN, MSN, CDE Diabetes Coordinator Inpatient Glycemic Control Team Team Pager: [phone] (8a-5p)

## 2014-09-18 NOTE — Progress Notes (Signed)
[facility]                                                                              Patient Demographics  [name], is a 24 y.o. female, DOB - [birth year], JIR:[id number]  Admit date - 09/16/2014   Admitting Physician Etta Quill, DO  Outpatient Primary MD for the patient is MICHAELS,CHASE A, PA-C  LOS - 2   Chief Complaint  Patient presents with  . Hyperglycemia  . Emesis  . Abdominal Pain      HPI on 09/16/2014 by Dr. Jennette Kettle [name] is a 24 y.o. female with h/o DM1 who presents to the ED with c/o N/V. Vomiting was initially food stuff but ultimately turned dark and coffee ground. No melena, no bright red blood  Interim history Continues to need gluocstabilizer.  GI consulted for hematemesis. Currently Hb stable.   Assessment & Plan   Diabetic ketoacidosis/Anion gap metabolic acidosis -Overnight, patient was transitioned to Lantus and sliding scale, however, gap has reopended and CO2 12 -Continue glucostabilizer and IVF -Continue to monitor BMP q4hrs -Possibly exacerbated by headache and not eating  -UA negative for infection -Hemoglobin A1c 8.7 -Diabetes coordinator consulted and appreciated, recommended using home dose of Lantus 36 units along with 4 units insulin per meal  Acute kidney injury -Secondary to the above -Cr currently 1.16 -Continue to monitor BMP  Nausea/vomiting -Secondary DKA -Continue antiemetics  Hematemesis -? Mallory-Weiss tear from continued vomiting due to DKA -Gastroenterology consult is appreciated -Continue PPI -EGD possibly in the next 24-48 hours electively -Hemoglobin currently stable, will continue to monitor CBC  Hypokalemia -Continue to monitor BMP and replace as needed  Tachycardia -Upon reviewing patient's chart, patient has a history of sinus tachycardia at baseline. This was seen in a note from Dr. Acie Fredrickson in April 2016.  -Per patient she has always been tachycardic, no  medications  Code Status: Full  Family Communication: None at bedside  Disposition Plan: Admitted. Continue to treat DKA  Time Spent in minutes 30 minutes  Procedures  None  Consults  Gastroenterology Diabetes Coordinator  DVT Prophylaxis SCDs  Lab Results  Component Value Date   PLT 255 09/18/2014    Medications  Scheduled Meds: . budesonide-formoterol  2 puff Inhalation BID  . montelukast  10 mg Oral Daily  . pantoprazole (PROTONIX) IV  40 mg Intravenous Q12H  . potassium chloride  10 mEq Intravenous Q1H   Continuous Infusions: . sodium chloride 100 mL/hr at 09/17/14 2345  . sodium chloride 999 mL/hr at 09/18/14 0948  . sodium chloride 100 mL/hr at 09/18/14 0945  . dextrose 5 % and 0.45% NaCl 100 mL/hr at 09/18/14 0954  . insulin (NOVOLIN-R) infusion     PRN Meds:.albuterol, benzonatate, morphine injection, ondansetron (ZOFRAN) IV, promethazine  Antibiotics    Anti-infectives    None      Subjective:   [name] seen and examined today.  Patient continues to complain of vomiting and nausea, but is no longer seeing dark emesis.  Denies current abdominal pain. Denies chest pain or shortness of breath, headache.  Objective:   Filed Vitals:   09/18/14 0300 09/18/14 0350 09/18/14 0400 09/18/14 0730  BP:  144/100 136/89   Pulse:      Temp: 98.8 F (37.1 C)   98.8 F (37.1 C)  TempSrc: Oral   Oral  Resp:  12 10   Height:      Weight:      SpO2:  97%      Wt Readings from Last 3 Encounters:  09/17/14 49.5 kg (109 lb 2 oz)  05/18/14 60.056 kg (132 lb 6.4 oz)  08/10/13 54.432 kg (120 lb)     Intake/Output Summary (Last 24 hours) at 09/18/14 1003 Last data filed at 09/17/14 1900  Gross per 24 hour  Intake   1300 ml  Output      0 ml  Net   1300 ml    Exam  General: Well developed, well nourished, mild distress  HEENT: NCAT,  mucous membranes moist.   Cardiovascular: S1 S2 auscultated, no rubs, murmurs or gallops.  Tachycardic  Respiratory: Clear to auscultation   Abdomen: Soft, nontender, nondistended, + bowel sounds  Extremities: warm dry without cyanosis clubbing or edema  Neuro: AAOx3, nonfocal  Psych: Normal affect and demeanor, pleasant  Data Review   Micro Results Recent Results (from the past 240 hour(s))  MRSA PCR Screening     Status: None   Collection Time: 09/17/14 11:28 AM  Result Value Ref Range Status   MRSA by PCR NEGATIVE NEGATIVE Final    Comment:        The GeneXpert MRSA Assay (FDA approved for NASAL specimens only), is one component of a comprehensive MRSA colonization surveillance program. It is not intended to diagnose MRSA infection nor to guide or monitor treatment for MRSA infections.     Radiology Reports Dg Chest Port 1 View  09/18/2014   CLINICAL DATA:  Nonproductive cough, vomiting, history of diabetes and asthma  EXAM: PORTABLE CHEST - 1 VIEW  COMPARISON:  PA and lateral chest x-ray of September 05, 2012  FINDINGS: The lungs are adequately inflated and clear. The heart and mediastinal structures are normal. There is no pleural effusion. The bony thorax exhibits no acute abnormality. Nipple rings are present bilaterally.  IMPRESSION: There is no active cardiopulmonary disease.   Electronically Signed   By: David  Martinique M.D.   On: 09/18/2014 08:24    CBC  Recent Labs Lab 09/16/14 0420 09/16/14 0448 09/16/14 1104 09/18/14 0647  WBC 11.9*  --   --  8.2  HGB 14.7 17.0* 13.9 13.4  HCT 45.0 50.0* 42.4 40.3  PLT 308  --   --  255  MCV 97.8  --   --  95.3  MCH 32.0  --   --  31.7  MCHC 32.7  --   --  33.3  RDW 13.2  --   --  13.3  LYMPHSABS 3.3  --   --   --   MONOABS 1.1*  --   --   --   EOSABS 0.2  --   --   --   BASOSABS 0.0  --   --   --     Chemistries   Recent Labs Lab 09/16/14 0420  09/17/14 0709 09/17/14 1047 09/17/14 1421 09/17/14 1957 09/18/14 0647  NA 136  < > 142 141 140 141 137  K 4.9  < > 3.5 3.4* 5.1 3.5 3.7  CL 103  < >  113* 114* 112* 112* 107  CO2 7*  < > 20* 20* 18* 21* 12*  GLUCOSE 547*  < > 172* 185*  208* 162* 280*  BUN 10  < > 7 7 6 6 8   CREATININE 1.57*  < > 0.87 0.80 0.87 0.78 1.16*  CALCIUM 9.0  < > 9.6 9.2 9.2 9.0 8.5*  AST 83*  --   --   --   --   --   --   ALT 43  --   --   --   --   --   --   ALKPHOS 168*  --   --   --   --   --   --   BILITOT 2.4*  --   --   --   --   --   --   < > = values in this interval not displayed. ------------------------------------------------------------------------------------------------------------------ estimated creatinine clearance is 58.4 mL/min (by C-G formula based on Cr of 1.16). ------------------------------------------------------------------------------------------------------------------ No results for input(s): HGBA1C in the last 72 hours. ------------------------------------------------------------------------------------------------------------------ No results for input(s): CHOL, HDL, LDLCALC, TRIG, CHOLHDL, LDLDIRECT in the last 72 hours. ------------------------------------------------------------------------------------------------------------------ No results for input(s): TSH, T4TOTAL, T3FREE, THYROIDAB in the last 72 hours.  Invalid input(s): FREET3 ------------------------------------------------------------------------------------------------------------------ No results for input(s): VITAMINB12, FOLATE, FERRITIN, TIBC, IRON, RETICCTPCT in the last 72 hours.  Coagulation profile  Recent Labs Lab 09/16/14 0420  INR 1.34    No results for input(s): DDIMER in the last 72 hours.  Cardiac Enzymes No results for input(s): CKMB, TROPONINI, MYOGLOBIN in the last 168 hours.  Invalid input(s): CK ------------------------------------------------------------------------------------------------------------------ Invalid input(s): POCBNP    Jash Wahlen D.O. on 09/18/2014 at 10:03 AM  Between 7am to 7pm - Pager -  [phone]  After 7pm go to [email or web address] - password [email or web address]  And look for the night coverage person covering for me after hours  [facility] Office  [phone]

## 2014-09-19 DIAGNOSIS — E0810 Diabetes mellitus due to underlying condition with ketoacidosis without coma: Secondary | ICD-10-CM

## 2014-09-19 DIAGNOSIS — K920 Hematemesis: Secondary | ICD-10-CM

## 2014-09-19 DIAGNOSIS — R110 Nausea: Secondary | ICD-10-CM

## 2014-09-19 LAB — CBC
HEMATOCRIT: 34.8 % — AB (ref 36.0–46.0)
HEMOGLOBIN: 11.6 g/dL — AB (ref 12.0–15.0)
MCH: 30.7 pg (ref 26.0–34.0)
MCHC: 33.3 g/dL (ref 30.0–36.0)
MCV: 92.1 fL (ref 78.0–100.0)
Platelets: 198 10*3/uL (ref 150–400)
RBC: 3.78 MIL/uL — AB (ref 3.87–5.11)
RDW: 12.7 % (ref 11.5–15.5)
WBC: 5.9 10*3/uL (ref 4.0–10.5)

## 2014-09-19 LAB — GLUCOSE, CAPILLARY
GLUCOSE-CAPILLARY: 157 mg/dL — AB (ref 65–99)
GLUCOSE-CAPILLARY: 184 mg/dL — AB (ref 65–99)
GLUCOSE-CAPILLARY: 169 mg/dL — AB (ref 65–99)
GLUCOSE-CAPILLARY: 175 mg/dL — AB (ref 65–99)
GLUCOSE-CAPILLARY: 197 mg/dL — AB (ref 65–99)
GLUCOSE-CAPILLARY: 145 mg/dL — AB (ref 65–99)
GLUCOSE-CAPILLARY: 166 mg/dL — AB (ref 65–99)
Glucose-Capillary: 129 mg/dL — ABNORMAL HIGH (ref 65–99)
Glucose-Capillary: 149 mg/dL — ABNORMAL HIGH (ref 65–99)
Glucose-Capillary: 152 mg/dL — ABNORMAL HIGH (ref 65–99)
Glucose-Capillary: 158 mg/dL — ABNORMAL HIGH (ref 65–99)
Glucose-Capillary: 159 mg/dL — ABNORMAL HIGH (ref 65–99)
Glucose-Capillary: 177 mg/dL — ABNORMAL HIGH (ref 65–99)
Glucose-Capillary: 180 mg/dL — ABNORMAL HIGH (ref 65–99)

## 2014-09-19 LAB — BASIC METABOLIC PANEL
Anion gap: 10 (ref 5–15)
BUN: <5 mg/dL — ABNORMAL LOW (ref 6–20)
CHLORIDE: 103 mmol/L (ref 101–111)
CO2: 23 mmol/L (ref 22–32)
Calcium: 8.2 mg/dL — ABNORMAL LOW (ref 8.9–10.3)
Creatinine, Ser: 0.51 mg/dL (ref 0.44–1.00)
GFR calc non Af Amer: >60 mL/min (ref >60)
Glucose, Bld: 185 mg/dL — ABNORMAL HIGH (ref 65–99)
POTASSIUM: 3.6 mmol/L (ref 3.5–5.1)
SODIUM: 136 mmol/L (ref 135–145)

## 2014-09-19 MED ORDER — INSULIN ASPART 100 UNIT/ML ~~LOC~~ SOLN
0.0000 [IU] | SUBCUTANEOUS | Status: DC
  Administered 2014-09-19: 3 [IU] via SUBCUTANEOUS
  Administered 2014-09-19: 2 [IU] via SUBCUTANEOUS
  Administered 2014-09-19: 3 [IU] via SUBCUTANEOUS
  Administered 2014-09-20: 5 [IU] via SUBCUTANEOUS
  Administered 2014-09-20: 3 [IU] via SUBCUTANEOUS
  Administered 2014-09-20 (×2): 5 [IU] via SUBCUTANEOUS
  Administered 2014-09-20 (×2): 2 [IU] via SUBCUTANEOUS
  Administered 2014-09-21: 11 [IU] via SUBCUTANEOUS
  Administered 2014-09-21: 8 [IU] via SUBCUTANEOUS
  Administered 2014-09-21: 2 [IU] via SUBCUTANEOUS
  Administered 2014-09-21: 8 [IU] via SUBCUTANEOUS

## 2014-09-19 MED ORDER — POTASSIUM CHLORIDE 10 MEQ/100ML IV SOLN
10.0000 meq | INTRAVENOUS | Status: AC
  Administered 2014-09-19 (×6): 10 meq via INTRAVENOUS
  Filled 2014-09-19 (×6): qty 100

## 2014-09-19 MED ORDER — INSULIN GLARGINE 100 UNIT/ML ~~LOC~~ SOLN
20.0000 [IU] | Freq: Once | SUBCUTANEOUS | Status: AC
  Administered 2014-09-19: 20 [IU] via SUBCUTANEOUS
  Filled 2014-09-19 (×2): qty 0.2

## 2014-09-19 NOTE — Progress Notes (Signed)
Inpatient Diabetes Program Recommendations  AACE/ADA: New Consensus Statement on Inpatient Glycemic Control (2013)  Target Ranges:  Prepandial:   less than 140 mg/dL      Peak postprandial:   less than 180 mg/dL (1-2 hours)      Critically ill patients:  140 - 180 mg/dL    Diabetes history: DM 1 Outpatient Diabetes medications: Lantus 36 units QHS, Nov SSI + Carb coverage Current orders for Inpatient glycemic control: Insulin gtt  Inpatient Diabetes Program Recommendations Insulin - Basal: Acidosis looks to be cleared, please consider starting patient's home dose of Lantus 36 units Q24hrs. Correction (SSI): Consider starting Novolog Sensitive Correction TID and HS scale. Insulin - Meal Coverage: Consider starting Novolog 4-6 units TID meal coverage (patient covers 1 unit for every 10 grams of carbs).  Thanks,  Tama Headings RN, MSN, [facility] Inpatient Diabetes Coordinator Team Pager [phone]

## 2014-09-19 NOTE — Progress Notes (Signed)
Subjective: Nausea and vomiting improved, not resolved. No further hematemesis. No blood in stool. Abdominal pain improving.  Objective: Vital signs in last 24 hours: Temp:  [97.4 F (36.3 C)-98.9 F (37.2 C)] 97.4 F (36.3 C) (08/31 0748) Pulse Rate:  [96-97] 97 (08/31 0732) Resp:  [0-20] 16 (08/31 1126) BP: (123-160)/(86-136) 140/99 mmHg (08/31 1126) SpO2:  [95 %-99 %] 97 % (08/31 1126) Weight change:  Last BM Date: 09/16/14  PE: GEN:  NAD, somewhat dehydrated-appearing ABD:  Soft, mild periumbilical tenderness without peritonitis  Lab Results: CBC    Component Value Date/Time   WBC 5.9 09/19/2014 0651   RBC 3.78* 09/19/2014 0651   HGB 11.6* 09/19/2014 0651   HCT 34.8* 09/19/2014 0651   PLT 198 09/19/2014 0651   MCV 92.1 09/19/2014 0651   MCH 30.7 09/19/2014 0651   MCHC 33.3 09/19/2014 0651   RDW 12.7 09/19/2014 0651   LYMPHSABS 3.3 09/16/2014 0420   MONOABS 1.1* 09/16/2014 0420   EOSABS 0.2 09/16/2014 0420   BASOSABS 0.0 09/16/2014 0420   CMP     Component Value Date/Time   NA 136 09/19/2014 0651   K 3.6 09/19/2014 0651   CL 103 09/19/2014 0651   CO2 23 09/19/2014 0651   GLUCOSE 185* 09/19/2014 0651   BUN <5* 09/19/2014 0651   CREATININE 0.51 09/19/2014 0651   CALCIUM 8.2* 09/19/2014 0651   PROT 7.3 09/16/2014 0420   ALBUMIN 3.5 09/16/2014 0420   AST 83* 09/16/2014 0420   ALT 43 09/16/2014 0420   ALKPHOS 168* 09/16/2014 0420   BILITOT 2.4* 09/16/2014 0420   GFRNONAA >60 09/19/2014 0651   GFRAA >60 09/19/2014 0651   Assessment:  1. Nausea and vomiting. Suspect from diabetic ketoacidosis. 2. Hematemesis. No bloody emesis at present. 3. Diabetic ketoacidosis.  Plan:  1.  Continue antiemetics and PPI. 2.  Plan for endoscopy tomorrow late morning. 3.  [facility] will follow.   [name] 09/19/2014, 12:06 PM   Pager [phone] If no answer or after 5 PM call [phone]

## 2014-09-19 NOTE — Progress Notes (Signed)
[facility]                                                                              Patient Demographics  [name], is a 24 y.o. female, DOB - January 20, 1990, ZOX:[id number]  Admit date - 09/16/2014   Admitting Physician Etta Quill, DO  Outpatient Primary MD for the patient is MICHAELS,CHASE A, PA-C  LOS - 3   Chief Complaint  Patient presents with  . Hyperglycemia  . Emesis  . Abdominal Pain      HPI on 09/16/2014 by Dr. Jennette Kettle [name] is a 24 y.o. female with h/o DM1 who presents to the ED with c/o N/V. Vomiting was initially food stuff but ultimately turned dark and coffee ground. No melena, no bright red blood  Interim history Feeling better, denies ab pain, no vomiting, but report nausea, npo, anion gap closed,  Assessment & Plan   Diabetic ketoacidosis/Anion gap metabolic acidosis -Overnight, patient was transitioned to Lantus and sliding scale, however, gap has reopended and CO2 12 -restarted glucostabilizer and IVF, gap closed on 8/31 am, transition off insulin drip, remain npo awaiting for egd. -UA negative for infection -Hemoglobin A1c 8.7 -Diabetes coordinator consulted and appreciated, recommended using home dose of Lantus 36 units along with 4 units insulin per meal Due to npo status, will start lantus 20units now, and ssi, insulin need to be further adjusted once able to eat.  Acute kidney injury -Secondary to the above -Cr currently 1.16 -Continue to monitor BMP  Nausea/vomiting -Secondary DKA -Continue antiemetics  Hematemesis -? Mallory-Weiss tear from continued vomiting due to DKA -Gastroenterology consult is appreciated -Continue PPI -EGD possibly in the next 24-48 hours electively -Hemoglobin currently stable, will continue to monitor CBC  Hypokalemia -Continue to monitor BMP and replace as needed  Tachycardia -Upon reviewing patient's chart, patient has a history of sinus tachycardia at baseline. This  was seen in a note from Dr. Acie Fredrickson in April 2016.  -Per patient she has always been tachycardic, no medications  Code Status: Full  Family Communication: None at bedside  Disposition Plan: Admitted. Continue to treat DKA  Time Spent in minutes 35 minutes  Procedures  None  Consults  Gastroenterology Diabetes Coordinator  DVT Prophylaxis SCDs  Lab Results  Component Value Date   PLT 198 09/19/2014    Medications  Scheduled Meds: . budesonide-formoterol  2 puff Inhalation BID  . insulin aspart  0-15 Units Subcutaneous 6 times per day  . montelukast  10 mg Oral Daily  . pantoprazole (PROTONIX) IV  40 mg Intravenous Q12H   Continuous Infusions: . sodium chloride Stopped (09/18/14 2300)   PRN Meds:.albuterol, benzonatate, morphine injection, ondansetron (ZOFRAN) IV, promethazine  Antibiotics    Anti-infectives    None      Subjective:   [name] seen and examined today.  Feeling better, still nauseous, no vomiting, no pain.   Objective:   Filed Vitals:   09/19/14 0748 09/19/14 1126 09/19/14 1500 09/19/14 1543  BP:  140/99  142/98  Pulse:    103  Temp: 97.4 F (36.3 C) 98.1 F (36.7 C) 99 F (37.2 C)   TempSrc: Oral Oral Oral  Resp:  16  16  Height:      Weight:      SpO2:  97%  95%    Wt Readings from Last 3 Encounters:  09/17/14 109 lb 2 oz (49.5 kg)  05/18/14 132 lb 6.4 oz (60.056 kg)  08/10/13 120 lb (54.432 kg)     Intake/Output Summary (Last 24 hours) at 09/19/14 1849 Last data filed at 09/19/14 1400  Gross per 24 hour  Intake 3001.75 ml  Output      0 ml  Net 3001.75 ml    Exam  General: Well developed, well nourished, NAD  HEENT: NCAT,  mucous membranes moist.   Cardiovascular: S1 S2 auscultated, no rubs, murmurs or gallops. Tachycardic  Respiratory: Clear to auscultation   Abdomen: Soft, nontender, nondistended, + bowel sounds  Extremities: warm dry without cyanosis clubbing or edema  Neuro:  AAOx3, nonfocal  Psych: Normal affect and demeanor, pleasant  Data Review   Micro Results Recent Results (from the past 240 hour(s))  MRSA PCR Screening     Status: None   Collection Time: 09/17/14 11:28 AM  Result Value Ref Range Status   MRSA by PCR NEGATIVE NEGATIVE Final    Comment:        The GeneXpert MRSA Assay (FDA approved for NASAL specimens only), is one component of a comprehensive MRSA colonization surveillance program. It is not intended to diagnose MRSA infection nor to guide or monitor treatment for MRSA infections.     Radiology Reports Dg Chest Port 1 View  09/18/2014   CLINICAL DATA:  Nonproductive cough, vomiting, history of diabetes and asthma  EXAM: PORTABLE CHEST - 1 VIEW  COMPARISON:  PA and lateral chest x-ray of September 05, 2012  FINDINGS: The lungs are adequately inflated and clear. The heart and mediastinal structures are normal. There is no pleural effusion. The bony thorax exhibits no acute abnormality. Nipple rings are present bilaterally.  IMPRESSION: There is no active cardiopulmonary disease.   Electronically Signed   By: David  Martinique M.D.   On: 09/18/2014 08:24    CBC  Recent Labs Lab 09/16/14 0420 09/16/14 0448 09/16/14 1104 09/18/14 0647 09/19/14 0651  WBC 11.9*  --   --  8.2 5.9  HGB 14.7 17.0* 13.9 13.4 11.6*  HCT 45.0 50.0* 42.4 40.3 34.8*  PLT 308  --   --  255 198  MCV 97.8  --   --  95.3 92.1  MCH 32.0  --   --  31.7 30.7  MCHC 32.7  --   --  33.3 33.3  RDW 13.2  --   --  13.3 12.7  LYMPHSABS 3.3  --   --   --   --   MONOABS 1.1*  --   --   --   --   EOSABS 0.2  --   --   --   --   BASOSABS 0.0  --   --   --   --     Chemistries   Recent Labs Lab 09/16/14 0420  09/18/14 0647 09/18/14 1345 09/18/14 1728 09/18/14 2110 09/19/14 0651  NA 136  < > 137 137 136 136 136  K 4.9  < > 3.7 3.5 3.1* 2.8* 3.6  CL 103  < > 107 111 107 106 103  CO2 7*  < > 12* 18* 20* 21* 23  GLUCOSE 547*  < > 280* 198* 175* 157* 185*  BUN  10  < > 8 5* <5* <5* <  5*  CREATININE 1.57*  < > 1.16* 0.57 0.54 0.53 0.51  CALCIUM 9.0  < > 8.5* 7.8* 8.0* 7.9* 8.2*  AST 83*  --   --   --   --   --   --   ALT 43  --   --   --   --   --   --   ALKPHOS 168*  --   --   --   --   --   --   BILITOT 2.4*  --   --   --   --   --   --   < > = values in this interval not displayed. ------------------------------------------------------------------------------------------------------------------ estimated creatinine clearance is 84.7 mL/min (by C-G formula based on Cr of 0.51). ------------------------------------------------------------------------------------------------------------------ No results for input(s): HGBA1C in the last 72 hours. ------------------------------------------------------------------------------------------------------------------ No results for input(s): CHOL, HDL, LDLCALC, TRIG, CHOLHDL, LDLDIRECT in the last 72 hours. ------------------------------------------------------------------------------------------------------------------ No results for input(s): TSH, T4TOTAL, T3FREE, THYROIDAB in the last 72 hours.  Invalid input(s): FREET3 ------------------------------------------------------------------------------------------------------------------ No results for input(s): VITAMINB12, FOLATE, FERRITIN, TIBC, IRON, RETICCTPCT in the last 72 hours.  Coagulation profile  Recent Labs Lab 09/16/14 0420  INR 1.34    No results for input(s): DDIMER in the last 72 hours.  Cardiac Enzymes No results for input(s): CKMB, TROPONINI, MYOGLOBIN in the last 168 hours.  Invalid input(s): CK ------------------------------------------------------------------------------------------------------------------ Invalid input(s): POCBNP    Yoana Staib MD PhD. on 09/19/2014 at 6:49 PM  Between 7am to 7pm - Pager - [phone]  After 7pm go to [email or web address] - password [email or web address]  And look for the night coverage person covering for me  after hours  [facility] Office  [phone]

## 2014-09-20 ENCOUNTER — Inpatient Hospital Stay (HOSPITAL_COMMUNITY): Payer: Medicaid Other | Admitting: Certified Registered Nurse Anesthetist

## 2014-09-20 ENCOUNTER — Encounter (HOSPITAL_COMMUNITY)
Admission: EM | Disposition: A | Discharge status: Home / Self Care | Payer: Self-pay | Source: Home / Self Care | Attending: Internal Medicine

## 2014-09-20 ENCOUNTER — Encounter (HOSPITAL_COMMUNITY): Payer: Self-pay | Admitting: Certified Registered Nurse Anesthetist

## 2014-09-20 DIAGNOSIS — E1065 Type 1 diabetes mellitus with hyperglycemia: Secondary | ICD-10-CM

## 2014-09-20 HISTORY — PX: ESOPHAGOGASTRODUODENOSCOPY (EGD) WITH PROPOFOL: SHX5813

## 2014-09-20 LAB — GLUCOSE, CAPILLARY
GLUCOSE-CAPILLARY: 202 mg/dL — AB (ref 65–99)
Glucose-Capillary: 122 mg/dL — ABNORMAL HIGH (ref 65–99)
Glucose-Capillary: 142 mg/dL — ABNORMAL HIGH (ref 65–99)
Glucose-Capillary: 165 mg/dL — ABNORMAL HIGH (ref 65–99)
Glucose-Capillary: 223 mg/dL — ABNORMAL HIGH (ref 65–99)
Glucose-Capillary: 245 mg/dL — ABNORMAL HIGH (ref 65–99)
Glucose-Capillary: 312 mg/dL — ABNORMAL HIGH (ref 65–99)

## 2014-09-20 LAB — COMPREHENSIVE METABOLIC PANEL
ALBUMIN: 2.8 g/dL — AB (ref 3.5–5.0)
ALK PHOS: 136 U/L — AB (ref 38–126)
ALT: 22 U/L (ref 14–54)
AST: 42 U/L — ABNORMAL HIGH (ref 15–41)
Anion gap: 10 (ref 5–15)
BILIRUBIN TOTAL: 0.9 mg/dL (ref 0.3–1.2)
BUN: <5 mg/dL — ABNORMAL LOW (ref 6–20)
CALCIUM: 8.6 mg/dL — AB (ref 8.9–10.3)
CO2: 27 mmol/L (ref 22–32)
CREATININE: 0.51 mg/dL (ref 0.44–1.00)
Chloride: 99 mmol/L — ABNORMAL LOW (ref 101–111)
GFR calc Af Amer: >60 mL/min (ref >60)
GFR calc non Af Amer: >60 mL/min (ref >60)
GLUCOSE: 166 mg/dL — AB (ref 65–99)
Potassium: 3.2 mmol/L — ABNORMAL LOW (ref 3.5–5.1)
SODIUM: 136 mmol/L (ref 135–145)
TOTAL PROTEIN: 5.9 g/dL — AB (ref 6.5–8.1)

## 2014-09-20 LAB — TSH: TSH: 1.674 u[IU]/mL (ref 0.350–4.500)

## 2014-09-20 LAB — MAGNESIUM: Magnesium: 1.5 mg/dL — ABNORMAL LOW (ref 1.7–2.4)

## 2014-09-20 SURGERY — ESOPHAGOGASTRODUODENOSCOPY (EGD) WITH PROPOFOL
Anesthesia: Monitor Anesthesia Care | Laterality: Left

## 2014-09-20 MED ORDER — LIDOCAINE HCL (CARDIAC) 20 MG/ML IV SOLN
INTRAVENOUS | Status: DC | PRN
  Administered 2014-09-20: 40 mg via INTRAVENOUS

## 2014-09-20 MED ORDER — INSULIN GLARGINE 100 UNIT/ML ~~LOC~~ SOLN
20.0000 [IU] | Freq: Every day | SUBCUTANEOUS | Status: DC
  Administered 2014-09-20 – 2014-09-21 (×2): 20 [IU] via SUBCUTANEOUS
  Filled 2014-09-20 (×2): qty 0.2

## 2014-09-20 MED ORDER — PROPOFOL 10 MG/ML IV BOLUS
INTRAVENOUS | Status: DC | PRN
  Administered 2014-09-20 (×2): 20 mg via INTRAVENOUS
  Administered 2014-09-20: 10 mg via INTRAVENOUS

## 2014-09-20 MED ORDER — FLUCONAZOLE 200 MG PO TABS
200.0000 mg | ORAL_TABLET | Freq: Once | ORAL | Status: AC
  Administered 2014-09-20: 200 mg via ORAL
  Filled 2014-09-20: qty 1

## 2014-09-20 MED ORDER — MAGNESIUM SULFATE 2 GM/50ML IV SOLN
2.0000 g | Freq: Once | INTRAVENOUS | Status: AC
  Administered 2014-09-20: 2 g via INTRAVENOUS
  Filled 2014-09-20: qty 50

## 2014-09-20 MED ORDER — SODIUM CHLORIDE 0.9 % IV SOLN
INTRAVENOUS | Status: DC
Start: 2014-09-20 — End: 2014-09-20
  Administered 2014-09-20: 12:00:00 via INTRAVENOUS

## 2014-09-20 MED ORDER — BUTAMBEN-TETRACAINE-BENZOCAINE 2-2-14 % EX AERO
INHALATION_SPRAY | CUTANEOUS | Status: DC | PRN
  Administered 2014-09-20: 2 via TOPICAL

## 2014-09-20 NOTE — Interval H&P Note (Signed)
History and Physical Interval Note:  09/20/2014 11:54 AM  [name]  has presented today for surgery, with the diagnosis of Nausea, vomiting, hematemesis  The various methods of treatment have been discussed with the patient and family. After consideration of risks, benefits and other options for treatment, the patient has consented to  Procedure(s): ESOPHAGOGASTRODUODENOSCOPY (EGD) WITH PROPOFOL (Left) as a surgical intervention .  The patient's history has been reviewed, patient examined, no change in status, stable for surgery.  I have reviewed the patient's chart and labs.  Questions were answered to the patient's satisfaction.     Marv Alfrey M  Assessment:  1.  Nausea and vomiting; generalized abdominal pain.  Suspect from diabetic ketoacidosis. 2.  Hematemesis, resolved, suspect healing Mallory-Weiss tear versus reflux esophagitis.  Plan:  1.  Endoscopy. 2.  Risks (bleeding, infection, bowel perforation that could require surgery, sedation-related changes in cardiopulmonary systems), benefits (identification and possible treatment of source of symptoms, exclusion of certain causes of symptoms), and alternatives (watchful waiting, radiographic imaging studies, empiric medical treatment) of upper endoscopy (EGD) were explained to patient/family in detail and patient wishes to proceed.

## 2014-09-20 NOTE — Progress Notes (Addendum)
Inpatient Diabetes Program Recommendations  AACE/ADA: New Consensus Statement on Inpatient Glycemic Control (2013)  Target Ranges:  Prepandial:   less than 140 mg/dL      Peak postprandial:   less than 180 mg/dL (1-2 hours)      Critically ill patients:  140 - 180 mg/dL   Results for EVERLEY, EVORA (MRN [id number]) as of 09/20/2014 08:20  Ref. Range 09/19/2014 20:56 09/20/2014 00:05 09/20/2014 03:36  Glucose-Capillary Latest Ref Range: 65-99 mg/dL 149 (H) 142 (H) 165 (H)   Diabetes history: DM 1 Outpatient Diabetes medications: Lantus 36 units QHS, Nov SSI + Carb coverage Current orders for Inpatient glycemic control: Novolog Moderate scale Q4hrs   Inpatient Diabetes Program Recommendations  Insulin - Basal: Patient only received a one time dose of 20 units of Lantus when transitioned off IV insulin. Patient is in the 160's with 20 of basal.  Please consider restarting Lantus 20 units Q24hrs.  Correction (SSI): Consider starting Novolog Sensitive Correction TID and HS scale.  Insulin - Meal Coverage: Patient is a type 1 DM and will need meal coverage. Consider starting Novolog 3-5 units TID meal coverage (patient covers 1 unit for every 10 grams of carbs).  Note: Paged Dr. Erlinda Hong about patient needing basal restarted at 0810.  Thanks,  Tama Headings RN, MSN, [facility] Inpatient Diabetes Coordinator Team Pager [phone]

## 2014-09-20 NOTE — Progress Notes (Signed)
Utilization review complete. Natalio Salois RN CCM Case Mgmt phone [phone] 

## 2014-09-20 NOTE — H&P (View-Only) (Signed)
Subjective: Nausea and vomiting improved, not resolved. No further hematemesis. No blood in stool. Abdominal pain improving.  Objective: Vital signs in last 24 hours: Temp:  [97.4 F (36.3 C)-98.9 F (37.2 C)] 97.4 F (36.3 C) (08/31 0748) Pulse Rate:  [96-97] 97 (08/31 0732) Resp:  [0-20] 16 (08/31 1126) BP: (123-160)/(86-136) 140/99 mmHg (08/31 1126) SpO2:  [95 %-99 %] 97 % (08/31 1126) Weight change:  Last BM Date: 09/16/14  PE: GEN:  NAD, somewhat dehydrated-appearing ABD:  Soft, mild periumbilical tenderness without peritonitis  Lab Results: CBC    Component Value Date/Time   WBC 5.9 09/19/2014 0651   RBC 3.78* 09/19/2014 0651   HGB 11.6* 09/19/2014 0651   HCT 34.8* 09/19/2014 0651   PLT 198 09/19/2014 0651   MCV 92.1 09/19/2014 0651   MCH 30.7 09/19/2014 0651   MCHC 33.3 09/19/2014 0651   RDW 12.7 09/19/2014 0651   LYMPHSABS 3.3 09/16/2014 0420   MONOABS 1.1* 09/16/2014 0420   EOSABS 0.2 09/16/2014 0420   BASOSABS 0.0 09/16/2014 0420   CMP     Component Value Date/Time   NA 136 09/19/2014 0651   K 3.6 09/19/2014 0651   CL 103 09/19/2014 0651   CO2 23 09/19/2014 0651   GLUCOSE 185* 09/19/2014 0651   BUN <5* 09/19/2014 0651   CREATININE 0.51 09/19/2014 0651   CALCIUM 8.2* 09/19/2014 0651   PROT 7.3 09/16/2014 0420   ALBUMIN 3.5 09/16/2014 0420   AST 83* 09/16/2014 0420   ALT 43 09/16/2014 0420   ALKPHOS 168* 09/16/2014 0420   BILITOT 2.4* 09/16/2014 0420   GFRNONAA >60 09/19/2014 0651   GFRAA >60 09/19/2014 0651   Assessment:  1. Nausea and vomiting. Suspect from diabetic ketoacidosis. 2. Hematemesis. No bloody emesis at present. 3. Diabetic ketoacidosis.  Plan:  1.  Continue antiemetics and PPI. 2.  Plan for endoscopy tomorrow late morning. 3.  [facility] will follow.   Landry Dyke 09/19/2014, 12:06 PM   Pager [phone] If no answer or after 5 PM call [phone]

## 2014-09-20 NOTE — Progress Notes (Signed)
Report received for transfer to [phone]

## 2014-09-20 NOTE — Progress Notes (Signed)
[facility]                                                                              Patient Demographics  [name], is a 24 y.o. female, DOB - [birth year], OHY:[id number]  Admit date - 09/16/2014   Admitting Physician Etta Quill, DO  Outpatient Primary MD for the patient is MICHAELS,CHASE A, PA-C  LOS - 4   Chief Complaint  Patient presents with  . Hyperglycemia  . Emesis  . Abdominal Pain      HPI on 09/16/2014 by Dr. Jennette Kettle [name] is a 24 y.o. female with h/o DM1 who presents to the ED with c/o N/V. Vomiting was initially food stuff but ultimately turned dark and coffee ground. No melena, no bright red blood   Assessment & Plan   Diabetic ketoacidosis/Anion gap metabolic acidosis -was treated with insulin drip initially then gap has reopended , restarted insulin drip, gap has remain closed, off insulin drip since 8/31 -UA negative for infection -Hemoglobin A1c 8.7 in 2014, 10.6 07/2014 -Diabetes coordinator consulted and appreciated,  -continue adjust insulin dose  Acute kidney injury -Secondary to the above -Cr currently 1.16 -normalized  Nausea/vomiting -Secondary DKA -Continue antiemetics -gastroparesis? Reglan?  Hematemesis -hgb stable, egd mild gastritis -Gastroenterology consult is appreciated -Continue PPI qd fot 6weeks per Gi   Hypokalemia -Continue to monitor BMP and replace as needed  Tachycardia -Upon reviewing patient's chart, patient has a history of sinus tachycardia at baseline. This was seen in a note from Dr. Acie Fredrickson in April 2016.  -Per patient she has always been tachycardic, no medications -tsh wnl, consider low dose betablocker  Code Status: Full  Family Communication: patient and her Aunt  Disposition Plan: improving, transfer to floor with tele, likely home tomorrow if no more vomiting  Time Spent in minutes 35 minutes  Procedures  None  Consults   Gastroenterology Diabetes Coordinator  DVT Prophylaxis SCDs  Lab Results  Component Value Date   PLT 198 09/19/2014    Medications  Scheduled Meds: . budesonide-formoterol  2 puff Inhalation BID  . insulin aspart  0-15 Units Subcutaneous 6 times per day  . insulin glargine  20 Units Subcutaneous Daily  . montelukast  10 mg Oral Daily  . pantoprazole (PROTONIX) IV  40 mg Intravenous Q12H   Continuous Infusions: . sodium chloride Stopped (09/18/14 2300)   PRN Meds:.albuterol, benzonatate, morphine injection, ondansetron (ZOFRAN) IV, promethazine  Antibiotics    Anti-infectives    None      Subjective:   [name] seen and examined today.  Feeling better, still nauseous, intermittent epigastric pain, vomited small amount of clear liquid this am.  Objective:   Filed Vitals:   09/20/14 1143 09/20/14 1230 09/20/14 1235 09/20/14 1356  BP: 145/103 142/98  138/97  Pulse:  115 111 105  Temp: 99.2 F (37.3 C) 98.7 F (37.1 C)  98.5 F (36.9 C)  TempSrc: Oral Oral  Oral  Resp: 20 18 14 12   Height:      Weight:      SpO2: 100% 99% 99% 95%    Wt Readings from Last 3 Encounters:  09/17/14 109 lb 2 oz (49.5  kg)  05/18/14 132 lb 6.4 oz (60.056 kg)  08/10/13 120 lb (54.432 kg)     Intake/Output Summary (Last 24 hours) at 09/20/14 1516 Last data filed at 09/20/14 1453  Gross per 24 hour  Intake    780 ml  Output      0 ml  Net    780 ml    Exam  General: Well developed, well nourished, NAD  HEENT: NCAT,  mucous membranes moist.   Cardiovascular: S1 S2 auscultated, no rubs, murmurs or gallops. Tachycardic  Respiratory: Clear to auscultation   Abdomen: Soft, nontender, nondistended, + bowel sounds  Extremities: warm dry without cyanosis clubbing or edema  Neuro: AAOx3, nonfocal  Psych: Normal affect and demeanor, pleasant  Data Review   Micro Results Recent Results (from the past 240 hour(s))  MRSA PCR Screening     Status: None    Collection Time: 09/17/14 11:28 AM  Result Value Ref Range Status   MRSA by PCR NEGATIVE NEGATIVE Final    Comment:        The GeneXpert MRSA Assay (FDA approved for NASAL specimens only), is one component of a comprehensive MRSA colonization surveillance program. It is not intended to diagnose MRSA infection nor to guide or monitor treatment for MRSA infections.     Radiology Reports Dg Chest Port 1 View  09/18/2014   CLINICAL DATA:  Nonproductive cough, vomiting, history of diabetes and asthma  EXAM: PORTABLE CHEST - 1 VIEW  COMPARISON:  PA and lateral chest x-ray of September 05, 2012  FINDINGS: The lungs are adequately inflated and clear. The heart and mediastinal structures are normal. There is no pleural effusion. The bony thorax exhibits no acute abnormality. Nipple rings are present bilaterally.  IMPRESSION: There is no active cardiopulmonary disease.   Electronically Signed   By: David  Martinique M.D.   On: 09/18/2014 08:24    CBC  Recent Labs Lab 09/16/14 0420 09/16/14 0448 09/16/14 1104 09/18/14 0647 09/19/14 0651  WBC 11.9*  --   --  8.2 5.9  HGB 14.7 17.0* 13.9 13.4 11.6*  HCT 45.0 50.0* 42.4 40.3 34.8*  PLT 308  --   --  255 198  MCV 97.8  --   --  95.3 92.1  MCH 32.0  --   --  31.7 30.7  MCHC 32.7  --   --  33.3 33.3  RDW 13.2  --   --  13.3 12.7  LYMPHSABS 3.3  --   --   --   --   MONOABS 1.1*  --   --   --   --   EOSABS 0.2  --   --   --   --   BASOSABS 0.0  --   --   --   --     Chemistries   Recent Labs Lab 09/16/14 0420  09/18/14 1345 09/18/14 1728 09/18/14 2110 09/19/14 0651 09/20/14 0336  NA 136  < > 137 136 136 136 136  K 4.9  < > 3.5 3.1* 2.8* 3.6 3.2*  CL 103  < > 111 107 106 103 99*  CO2 7*  < > 18* 20* 21* 23 27  GLUCOSE 547*  < > 198* 175* 157* 185* 166*  BUN 10  < > 5* <5* <5* <5* <5*  CREATININE 1.57*  < > 0.57 0.54 0.53 0.51 0.51  CALCIUM 9.0  < > 7.8* 8.0* 7.9* 8.2* 8.6*  MG  --   --   --   --   --   --  1.5*  AST 83*  --   --    --   --   --  42*  ALT 43  --   --   --   --   --  22  ALKPHOS 168*  --   --   --   --   --  136*  BILITOT 2.4*  --   --   --   --   --  0.9  < > = values in this interval not displayed. ------------------------------------------------------------------------------------------------------------------ estimated creatinine clearance is 84.7 mL/min (by C-G formula based on Cr of 0.51). ------------------------------------------------------------------------------------------------------------------ No results for input(s): HGBA1C in the last 72 hours. ------------------------------------------------------------------------------------------------------------------ No results for input(s): CHOL, HDL, LDLCALC, TRIG, CHOLHDL, LDLDIRECT in the last 72 hours. ------------------------------------------------------------------------------------------------------------------  Recent Labs  09/20/14 0336  TSH 1.674   ------------------------------------------------------------------------------------------------------------------ No results for input(s): VITAMINB12, FOLATE, FERRITIN, TIBC, IRON, RETICCTPCT in the last 72 hours.  Coagulation profile  Recent Labs Lab 09/16/14 0420  INR 1.34    No results for input(s): DDIMER in the last 72 hours.  Cardiac Enzymes No results for input(s): CKMB, TROPONINI, MYOGLOBIN in the last 168 hours.  Invalid input(s): CK ------------------------------------------------------------------------------------------------------------------ Invalid input(s): POCBNP    Maryann Mccall MD PhD. on 09/20/2014 at 3:16 PM  Between 7am to 7pm - Pager - [phone]  After 7pm go to [email or web address] - password [email or web address]  And look for the night coverage person covering for me after hours  [facility] Office  [phone]

## 2014-09-20 NOTE — Transfer of Care (Signed)
Immediate Anesthesia Transfer of Care Note  [name]  Procedure(s) Performed: Procedure(s): ESOPHAGOGASTRODUODENOSCOPY (EGD) WITH PROPOFOL (Left)  Patient Location: Endoscopy Unit  Anesthesia Type:MAC  Level of Consciousness: awake, alert , oriented and patient cooperative  Airway & Oxygen Therapy: Patient Spontanous Breathing  Post-op Assessment: Report given to RN, Post -op Vital signs reviewed and stable and Patient moving all extremities  Post vital signs: Reviewed and stable  Last Vitals:  Filed Vitals:   09/20/14 1356  BP: 138/97  Pulse: 105  Temp: 36.9 C  Resp: 12    Complications: No apparent anesthesia complications

## 2014-09-20 NOTE — Anesthesia Preprocedure Evaluation (Signed)
Anesthesia Evaluation  Patient identified by MRN, date of birth, ID band Patient awake    Reviewed: Allergy & Precautions, NPO status , Patient's Chart, lab work & pertinent test results  History of Anesthesia Complications Negative for: history of anesthetic complications  Airway Mallampati: II  TM Distance: >3 FB Neck ROM: Full    Dental  (+) Teeth Intact,    Pulmonary asthma ,  breath sounds clear to auscultation        Cardiovascular negative cardio ROS  Rhythm:Regular     Neuro/Psych  Headaches, negative psych ROS   GI/Hepatic negative GI ROS, Neg liver ROS,   Endo/Other  diabetes, Type 1, Insulin Dependent  Renal/GU      Musculoskeletal   Abdominal   Peds  Hematology  (+) anemia ,   Anesthesia Other Findings   Reproductive/Obstetrics                             Anesthesia Physical Anesthesia Plan  ASA: II  Anesthesia Plan: MAC   Post-op Pain Management:    Induction: Intravenous  Airway Management Planned: Nasal Cannula  Additional Equipment:   Intra-op Plan:   Post-operative Plan:   Informed Consent: I have reviewed the patients History and Physical, chart, labs and discussed the procedure including the risks, benefits and alternatives for the proposed anesthesia with the patient or authorized representative who has indicated his/her understanding and acceptance.   Dental advisory given  Plan Discussed with: CRNA and Surgeon  Anesthesia Plan Comments:         Anesthesia Quick Evaluation

## 2014-09-20 NOTE — Progress Notes (Signed)
NURSING PROGRESS NOTE  [name] [id number] Transfer Data: 09/20/2014 6:50 PM Attending Provider: Florencia Reasons, MD SPQ:[id number],QJFHL A, PA-C Code Status: FULL   [name] is a 24 y.o. female patient transferred from 3s  -No acute distress noted.  -No complaints of shortness of breath.  -No complaints of chest pain.   Cardiac Monitoring: Box # 04 in place. Cardiac monitor yields:sinus tachycardia.  Blood pressure 125/89, pulse 104, temperature 98.8 F (37.1 C), temperature source Oral, resp. rate 15, height 5\' 4"  (1.626 m), weight 49.5 kg (109 lb 2 oz), last menstrual period 08/02/2014, SpO2 100 %.   IV Fluids:  IV in place, occlusive dsg intact without redness, IV cath hand right, condition patent and no redness none.   Allergies:  Peanut-containing drug products and Strawberry  Past Medical History:   has a past medical history of Diabetes mellitus; Asthma; Hypertension; and Headache(784.0).  Past Surgical History:   has past surgical history that includes Wisdom tooth extraction.  Social History:   reports that she has never smoked. She has never used smokeless tobacco. She reports that she does not drink alcohol or use illicit drugs.  Skin: intact  Patient/Family orientated to room. Information packet given to patient/family. Admission inpatient armband information verified with patient/family to include name and date of birth and placed on patient arm. Side rails up x 2, fall assessment and education completed with patient/family. Patient/family able to verbalize understanding of risk associated with falls and verbalized understanding to call for assistance before getting out of bed. Call light within reach. Patient/family able to voice and demonstrate understanding of unit orientation instructions.    Will continue to evaluate and treat per MD orders.

## 2014-09-20 NOTE — Op Note (Signed)
[facility] [place], [id number]   ENDOSCOPY PROCEDURE REPORT  [name], [name]  MR#: [id number] BIRTHDATE: 11/15/1990 , 23  yrs. old GENDER: female ENDOSCOPIST: Arta Silence, MD REFERRED BY:  [facility] PROCEDURE DATE:  10/09/2014 PROCEDURE:  EGD, diagnostic ASA CLASS:     Class III INDICATIONS:  abdominal pain, nausea, vomiting, hematemesis. MEDICATIONS: Monitored anesthesia care TOPICAL ANESTHETIC:  DESCRIPTION OF PROCEDURE: After the risks benefits and alternatives of the procedure were thoroughly explained, informed consent was obtained.  The Pentax Gastroscope [id number] endoscope was introduced through the mouth and advanced to the second portion of the duodenum. The instrument was slowly withdrawn as the mucosa was fully examined. Estimated blood loss is zero unless otherwise noted in this procedure report.    Findings:  Normal esophagus.  Small hiatal hernia.  Mild diffuse gastritis.  Otherwise normal stomach and pylorus.  Normal retroflexed view of cardia.  Normal duodenum to the second portion. No ulcer, mass, AVM or other pathology seen.  No old or fresh blood seen to the extent of our examination.             The scope was then withdrawn from the patient and the procedure completed.  COMPLICATIONS: There were no immediate complications.  ENDOSCOPIC IMPRESSION:     As above.  No explanation for patient's reported hematemesis.  Might have had some coffee ground material from her gastritis, but no lesion seen on upper endoscopy that puts patient at high-risk for any further rebleeding.  RECOMMENDATIONS:     1.  Watch for potential complications of procedure. 2.  Pantoprazole 40 mg po qd for the next six weeks, then stop. 3.  If patient has recurrent nausea and vomiting as outpatient, while not in DKA, would consider gastric emptying study to assess for possible gastroparesis. 4.  Full liquid diet,  advance slowly to diabetic prudent as tolerated. 5.  [facility] will sign-off; please call with questions; thank you for the consultation.  eSigned:  Arta Silence, MD 2014-10-09 12:33 PM   CC:  CPT CODES: ICD CODES:  The ICD and CPT codes recommended by this software are interpretations from the data that the clinical staff has captured with the software.  The verification of the translation of this report to the ICD and CPT codes and modifiers is the sole responsibility of the health care institution and practicing physician where this report was generated.  [email or web address] will not be held responsible for the validity of the ICD and CPT codes included on this report.  AMA assumes no liability for data contained or not contained herein. CPT is a Designer, television/film set of the [employer].

## 2014-09-21 ENCOUNTER — Inpatient Hospital Stay (HOSPITAL_COMMUNITY): Payer: Medicaid Other

## 2014-09-21 ENCOUNTER — Encounter (HOSPITAL_COMMUNITY): Payer: Self-pay | Admitting: Gastroenterology

## 2014-09-21 LAB — CBC
HCT: 34.9 % — ABNORMAL LOW (ref 36.0–46.0)
Hemoglobin: 11.7 g/dL — ABNORMAL LOW (ref 12.0–15.0)
MCH: 30.9 pg (ref 26.0–34.0)
MCHC: 33.5 g/dL (ref 30.0–36.0)
MCV: 92.1 fL (ref 78.0–100.0)
PLATELETS: 242 10*3/uL (ref 150–400)
RBC: 3.79 MIL/uL — ABNORMAL LOW (ref 3.87–5.11)
RDW: 12.7 % (ref 11.5–15.5)
WBC: 4.9 10*3/uL (ref 4.0–10.5)

## 2014-09-21 LAB — HEMOGLOBIN A1C
HEMOGLOBIN A1C: 11.1 % — AB (ref 4.8–5.6)
MEAN PLASMA GLUCOSE: 272 mg/dL

## 2014-09-21 LAB — COMPREHENSIVE METABOLIC PANEL
ALBUMIN: 2.7 g/dL — AB (ref 3.5–5.0)
ALT: 18 U/L (ref 14–54)
ANION GAP: 9 (ref 5–15)
AST: 35 U/L (ref 15–41)
Alkaline Phosphatase: 133 U/L — ABNORMAL HIGH (ref 38–126)
BUN: 7 mg/dL (ref 6–20)
CHLORIDE: 97 mmol/L — AB (ref 101–111)
CO2: 30 mmol/L (ref 22–32)
Calcium: 8.6 mg/dL — ABNORMAL LOW (ref 8.9–10.3)
Creatinine, Ser: 0.64 mg/dL (ref 0.44–1.00)
GFR calc Af Amer: >60 mL/min (ref >60)
Glucose, Bld: 227 mg/dL — ABNORMAL HIGH (ref 65–99)
POTASSIUM: 3 mmol/L — AB (ref 3.5–5.1)
Sodium: 136 mmol/L (ref 135–145)
TOTAL PROTEIN: 5.4 g/dL — AB (ref 6.5–8.1)
Total Bilirubin: 1 mg/dL (ref 0.3–1.2)

## 2014-09-21 LAB — GLUCOSE, CAPILLARY
GLUCOSE-CAPILLARY: 270 mg/dL — AB (ref 65–99)
Glucose-Capillary: 142 mg/dL — ABNORMAL HIGH (ref 65–99)
Glucose-Capillary: 270 mg/dL — ABNORMAL HIGH (ref 65–99)

## 2014-09-21 LAB — MAGNESIUM: MAGNESIUM: 1.8 mg/dL (ref 1.7–2.4)

## 2014-09-21 MED ORDER — POTASSIUM CHLORIDE CRYS ER 20 MEQ PO TBCR
40.0000 meq | EXTENDED_RELEASE_TABLET | Freq: Once | ORAL | Status: AC
  Administered 2014-09-21: 40 meq via ORAL
  Filled 2014-09-21: qty 2

## 2014-09-21 MED ORDER — PANTOPRAZOLE SODIUM 40 MG PO TBEC: 40.0000 mg | DELAYED_RELEASE_TABLET | Freq: Every day | ORAL | Status: DC

## 2014-09-21 NOTE — Care Management Note (Signed)
Case Management Note  Patient Details  Name: Davionna Blacksher MRN: [id number] Date of Birth: Jan 17, 1991  Subjective/Objective:     Patient is indep, has insurance , has no problem getting medications, lives with her mom and her child.  She has transportation at discharge.  Patient is for dc today.  No needs.               Action/Plan:   Expected Discharge Date:                  Expected Discharge Plan:  Home/Self Care  In-House Referral:     Discharge planning Services  CM Consult  Post Acute Care Choice:    Choice offered to:     DME Arranged:    DME Agency:     HH Arranged:    [place] Agency:     Status of Service:  Completed, signed off  Medicare Important Message Given:    Date Medicare IM Given:    Medicare IM give by:    Date Additional Medicare IM Given:    Additional Medicare Important Message give by:     If discussed at [place] of Stay Meetings, dates discussed:    Additional Comments:  Zenon Mayo, RN 09/21/2014, 11:09 AM

## 2014-09-21 NOTE — Progress Notes (Signed)
Pt discharged to home with mom. Discharge instructions given to pt adn mother with no further questions at this time. Belongings with pt

## 2014-09-21 NOTE — Discharge Summary (Signed)
Discharge Summary  [name] ZWC:[id number] DOB: 07/20/1990  PCP: Coletta Memos, PA-C  Admit date: 09/16/2014 Discharge date: 09/21/2014  Time spent: <76mins  Recommendations for Outpatient Follow-up:  1. F/u with PMD for hospital discharge follow up  Discharge Diagnoses:  Active Hospital Problems   Diagnosis Date Noted  . DKA (diabetic ketoacidoses) 06/29/2012  . Cough   . Hematemesis 09/16/2014  . Uncontrolled type 1 diabetes mellitus 06/19/2010    Resolved Hospital Problems   Diagnosis Date Noted Date Resolved  No resolved problems to display.    Discharge Condition: stable  Diet recommendation: heart healthy/carb modified  Filed Weights   09/17/14 0000  Weight: 109 lb 2 oz (49.5 kg)    History of present illness:  [name] is a 24 y.o. female with h/o DM1 who presents to the ED with c/o N/V. Vomiting was initially food stuff but ultimately turned dark and coffee ground. No melena, no bright red blood,   Hospital Course:  Principal Problem:   DKA (diabetic ketoacidoses) Active Problems:   Uncontrolled type 1 diabetes mellitus   Hematemesis   Cough Diabetic ketoacidosis/Anion gap metabolic acidosis -was treated with insulin drip initially then gap has reopended , restarted insulin drip, gap has remain closed, off insulin drip since 8/31 -UA negative for infection -Hemoglobin A1c 8.7 in 2014, 10.6 07/2014, A1C 11.1 on 09/20/2014 -Diabetes coordinator consulted and appreciated,  -PMD continue adjust insulin dose  Mild elevation of lft, resolved at time of discharge, live ultrasound result pending, acute hepatitis panel pending, pmd to follow up with final result.  Acute kidney injury -Secondary to the above -Cr currently 1.16 -normalized  Nausea/vomiting -Secondary DKA -Continue antiemetics -resolved  Hematemesis -hgb stable, egd mild gastritis -Gastroenterology consult is appreciated -Continue PPI qd fot 6weeks per  Gi   Hypokalemia -Continue to monitor BMP and replace as needed  Tachycardia -Upon reviewing patient's chart, patient has a history of sinus tachycardia at baseline. This was seen in a note from Dr. Acie Fredrickson in April 2016.  -Per patient she has always been tachycardic, no medications -tsh wnl, tachycardia resolved at time of discharge  Code Status: Full  Family Communication: patient   Disposition Plan: home   Procedures  EGD 9/1  Consults  Gastroenterology Diabetes Coordinator   Discharge Exam: BP 119/86 mmHg  Pulse 92  Temp(Src) 98.4 F (36.9 C) (Oral)  Resp 16  Ht 5\' 4"  (1.626 m)  Wt 109 lb 2 oz (49.5 kg)  BMI 18.72 kg/m2  SpO2 100%  LMP 08/02/2014 (Approximate)   General: Well developed, well nourished, NAD  HEENT: NCAT, mucous membranes moist.   Cardiovascular: S1 S2 auscultated, no rubs, murmurs or gallops.   Respiratory: Clear to auscultation  Abdomen: Soft, nontender, nondistended, + bowel sounds  Extremities: warm dry without cyanosis clubbing or edema  Neuro: AAOx3, nonfocal  Psych: Normal affect and demeanor, pleasant  Discharge Instructions You were cared for by a hospitalist during your hospital stay. If you have any questions about your discharge medications or the care you received while you were in the hospital after you are discharged, you can call the unit and asked to speak with the hospitalist on call if the hospitalist that took care of you is not available. Once you are discharged, your primary care physician will handle any further medical issues. Please note that NO REFILLS for any discharge medications will be authorized once you are discharged, as it is imperative that you return to your primary care physician (or establish a  relationship with a primary care physician if you do not have one) for your aftercare needs so that they can reassess your need for medications and monitor your lab values.  Discharge Instructions     Diet - low sodium heart healthy    Complete by:  As directed   Carb modified     Increase activity slowly    Complete by:  As directed             Medication List    STOP taking these medications        ibuprofen 800 MG tablet  Commonly known as:  ADVIL,MOTRIN      TAKE these medications        albuterol 108 (90 BASE) MCG/ACT inhaler  Commonly known as:  PROVENTIL HFA;VENTOLIN HFA  Inhale 2 puffs into the lungs every 6 (six) hours as needed for shortness of breath.     albuterol (2.5 MG/3ML) 0.083% nebulizer solution  Commonly known as:  PROVENTIL  Take 2.5 mg by nebulization every 6 (six) hours as needed for shortness of breath.     budesonide-formoterol 160-4.5 MCG/ACT inhaler  Commonly known as:  SYMBICORT  Inhale 2 puffs into the lungs 2 (two) times daily.     EPIPEN 0.3 mg/0.3 mL Soaj injection  Generic drug:  EPINEPHrine  Inject 0.3 mg into the muscle once.     fluticasone 50 MCG/ACT nasal spray  Commonly known as:  FLONASE  Place 2 sprays into the nose daily as needed for rhinitis or allergies.     glucagon 1 MG injection  Inject 1 mg into the muscle as needed. FOR LOW BLOOD SUGAR     insulin aspart 100 UNIT/ML FlexPen  Commonly known as:  NOVOLOG FLEXPEN  INJECT 0-20 UNITS INTO THE SKIN AS DIRECTED. SSI - CARB SCALE, 0-20 UNITS     Insulin Glargine 100 UNIT/ML Solostar Pen  Commonly known as:  LANTUS SOLOSTAR  Inject 36 Units into the skin daily at 10 pm.     Insulin Pen Needle 32G X 4 MM Misc  Use as directed 3 times per day     montelukast 10 MG tablet  Commonly known as:  SINGULAIR  Take 10 mg by mouth daily.     pantoprazole 40 MG tablet  Commonly known as:  PROTONIX  Take 1 tablet (40 mg total) by mouth daily.     PROBIOTIC DAILY PO  Take 1 capsule by mouth daily.       Allergies  Allergen Reactions  . Peanut-Containing Drug Products Swelling    Swelling of mouth, lips  . Strawberry Swelling    Swelling of mouth, lips        Follow-up Information    Follow up with MICHAELS,CHASE A, PA-C In 1 week.   Specialty:  General Practice   Why:  hospital discharge follow up   Contact information:   [address] [place] [address] [phone]        The results of significant diagnostics from this hospitalization (including imaging, microbiology, ancillary and laboratory) are listed below for reference.    Significant Diagnostic Studies: Dg Chest Port 1 View  09/18/2014   CLINICAL DATA:  Nonproductive cough, vomiting, history of diabetes and asthma  EXAM: PORTABLE CHEST - 1 VIEW  COMPARISON:  PA and lateral chest x-ray of September 05, 2012  FINDINGS: The lungs are adequately inflated and clear. The heart and mediastinal structures are normal. There is no pleural effusion. The bony thorax  exhibits no acute abnormality. Nipple rings are present bilaterally.  IMPRESSION: There is no active cardiopulmonary disease.   Electronically Signed   By: David  Martinique M.D.   On: 09/18/2014 08:24    Microbiology: Recent Results (from the past 240 hour(s))  MRSA PCR Screening     Status: None   Collection Time: 09/17/14 11:28 AM  Result Value Ref Range Status   MRSA by PCR NEGATIVE NEGATIVE Final    Comment:        The GeneXpert MRSA Assay (FDA approved for NASAL specimens only), is one component of a comprehensive MRSA colonization surveillance program. It is not intended to diagnose MRSA infection nor to guide or monitor treatment for MRSA infections.      Labs: Basic Metabolic Panel:  Recent Labs Lab 09/18/14 1728 09/18/14 2110 09/19/14 0651 09/20/14 0336 09/21/14 0616  NA 136 136 136 136 136  K 3.1* 2.8* 3.6 3.2* 3.0*  CL 107 106 103 99* 97*  CO2 20* 21* 23 27 30   GLUCOSE 175* 157* 185* 166* 227*  BUN <5* <5* <5* <5* 7  CREATININE 0.54 0.53 0.51 0.51 0.64  CALCIUM 8.0* 7.9* 8.2* 8.6* 8.6*  MG  --   --   --  1.5* 1.8   Liver Function Tests:  Recent Labs Lab 09/16/14 0420 09/20/14 0336  09/21/14 0616  AST 83* 42* 35  ALT 43 22 18  ALKPHOS 168* 136* 133*  BILITOT 2.4* 0.9 1.0  PROT 7.3 5.9* 5.4*  ALBUMIN 3.5 2.8* 2.7*    Recent Labs Lab 09/16/14 0420  LIPASE 15*   No results for input(s): AMMONIA in the last 168 hours. CBC:  Recent Labs Lab 09/16/14 0420 09/16/14 0448 09/16/14 1104 09/18/14 0647 09/19/14 0651 09/21/14 0616  WBC 11.9*  --   --  8.2 5.9 4.9  NEUTROABS 7.3  --   --   --   --   --   HGB 14.7 17.0* 13.9 13.4 11.6* 11.7*  HCT 45.0 50.0* 42.4 40.3 34.8* 34.9*  MCV 97.8  --   --  95.3 92.1 92.1  PLT 308  --   --  255 198 242   Cardiac Enzymes: No results for input(s): CKTOTAL, CKMB, CKMBINDEX, TROPONINI in the last 168 hours. BNP: BNP (last 3 results) No results for input(s): BNP in the last 8760 hours.  ProBNP (last 3 results) No results for input(s): PROBNP in the last 8760 hours.  CBG:  Recent Labs Lab 09/20/14 2011 09/20/14 2330 09/21/14 0355 09/21/14 0832 09/21/14 1255  GLUCAP 245* 312* 142* 270* 270*       Signed:  Ahmaud Duthie MD, PhD  [facility] 09/21/2014, 4:35 PM

## 2014-09-21 NOTE — Progress Notes (Signed)
Inpatient Diabetes Program Recommendations  AACE/ADA: New Consensus Statement on Inpatient Glycemic Control (2013)  Target Ranges:  Prepandial:   less than 140 mg/dL      Peak postprandial:   less than 180 mg/dL (1-2 hours)      Critically ill patients:  140 - 180 mg/dL    Results for MONITA, SWIER (MRN [id number]) as of 09/21/2014 09:49  Ref. Range 09/20/2014 00:05 09/20/2014 03:36 09/20/2014 07:48 09/20/2014 13:17 09/20/2014 16:05 09/20/2014 20:11  Glucose-Capillary Latest Ref Range: 65-99 mg/dL 142 (H) 165 (H) 223 (H) 202 (H) 122 (H) 245 (H)    Results for FADIA, MARLAR (MRN [id number]) as of 09/21/2014 09:49  Ref. Range 09/20/2014 23:30 09/21/2014 03:55 09/21/2014 08:32  Glucose-Capillary Latest Ref Range: 65-99 mg/dL 312 (H) 142 (H) 270 (H)    Home DM Meds: Lantus 36 units QHS  Novolog SSI + Carbohydrate Coverage  Current DM Orders: Lantus 20 units daily    Novolog Moderate SSI Q4 hours      MD- Please consider the following in-hospital insulin adjustments:  1. Increase Lantus to 25 units daily  2. Change Novolog SSI to Sensitive scale TID AC + HS (currently ordered as Moderate scale Q4 hours)  3. Start low dose Novolog Meal Coverage- Novolog 3 units tid with meals     Will follow Wyn Quaker RN, MSN, CDE Diabetes Coordinator Inpatient Glycemic Control Team Team Pager: [phone] (8a-5p)

## 2014-09-22 LAB — HEPATITIS PANEL, ACUTE
HCV Ab: <0.1 s/co ratio (ref 0.0–0.9)
HEP A IGM: NEGATIVE
HEP B C IGM: NEGATIVE
Hepatitis B Surface Ag: NEGATIVE

## 2014-09-25 NOTE — Anesthesia Postprocedure Evaluation (Signed)
  Anesthesia Post-op Note  Patient: [name]  Procedure(s) Performed: Procedure(s): ESOPHAGOGASTRODUODENOSCOPY (EGD) WITH PROPOFOL (Left)  Patient Location: PACU  Anesthesia Type:MAC  Level of Consciousness: awake  Airway and Oxygen Therapy: Patient Spontanous Breathing  Post-op Pain: none  Post-op Assessment: Post-op Vital signs reviewed, Patient's Cardiovascular Status Stable, Respiratory Function Stable, Patent Airway, No signs of Nausea or vomiting and Pain level controlled LLE Motor Response: Purposeful movement, Responds to commands   RLE Motor Response: Purposeful movement, Responds to commands        Post-op Vital Signs: Reviewed and stable  Last Vitals:  Filed Vitals:   09/21/14 1430  BP: 119/86  Pulse: 92  Temp: 36.9 C  Resp: 16    Complications: No apparent anesthesia complications

## 2014-11-24 ENCOUNTER — Encounter (HOSPITAL_COMMUNITY): Payer: Self-pay | Admitting: Emergency Medicine

## 2014-11-24 ENCOUNTER — Inpatient Hospital Stay (HOSPITAL_COMMUNITY)
Admission: EM | Admit: 2014-11-24 | Discharge: 2014-11-29 | DRG: 871 | Disposition: A | Discharge status: Home / Self Care | Payer: BLUE CROSS/BLUE SHIELD | Attending: Internal Medicine | Admitting: Internal Medicine

## 2014-11-24 ENCOUNTER — Emergency Department (HOSPITAL_COMMUNITY): Payer: BLUE CROSS/BLUE SHIELD

## 2014-11-24 DIAGNOSIS — E1043 Type 1 diabetes mellitus with diabetic autonomic (poly)neuropathy: Secondary | ICD-10-CM | POA: Diagnosis present

## 2014-11-24 DIAGNOSIS — E1010 Type 1 diabetes mellitus with ketoacidosis without coma: Secondary | ICD-10-CM | POA: Diagnosis present

## 2014-11-24 DIAGNOSIS — K3184 Gastroparesis: Secondary | ICD-10-CM | POA: Diagnosis present

## 2014-11-24 DIAGNOSIS — N179 Acute kidney failure, unspecified: Secondary | ICD-10-CM | POA: Diagnosis present

## 2014-11-24 DIAGNOSIS — R7989 Other specified abnormal findings of blood chemistry: Secondary | ICD-10-CM

## 2014-11-24 DIAGNOSIS — R112 Nausea with vomiting, unspecified: Secondary | ICD-10-CM | POA: Diagnosis present

## 2014-11-24 DIAGNOSIS — R160 Hepatomegaly, not elsewhere classified: Secondary | ICD-10-CM | POA: Diagnosis present

## 2014-11-24 DIAGNOSIS — IMO0002 Reserved for concepts with insufficient information to code with codable children: Secondary | ICD-10-CM | POA: Diagnosis present

## 2014-11-24 DIAGNOSIS — J4520 Mild intermittent asthma, uncomplicated: Secondary | ICD-10-CM | POA: Diagnosis not present

## 2014-11-24 DIAGNOSIS — Z833 Family history of diabetes mellitus: Secondary | ICD-10-CM | POA: Diagnosis not present

## 2014-11-24 DIAGNOSIS — G43909 Migraine, unspecified, not intractable, without status migrainosus: Secondary | ICD-10-CM | POA: Diagnosis present

## 2014-11-24 DIAGNOSIS — Z23 Encounter for immunization: Secondary | ICD-10-CM

## 2014-11-24 DIAGNOSIS — J45909 Unspecified asthma, uncomplicated: Secondary | ICD-10-CM | POA: Diagnosis present

## 2014-11-24 DIAGNOSIS — E1065 Type 1 diabetes mellitus with hyperglycemia: Secondary | ICD-10-CM | POA: Diagnosis present

## 2014-11-24 DIAGNOSIS — R748 Abnormal levels of other serum enzymes: Secondary | ICD-10-CM | POA: Diagnosis present

## 2014-11-24 DIAGNOSIS — Z809 Family history of malignant neoplasm, unspecified: Secondary | ICD-10-CM

## 2014-11-24 DIAGNOSIS — D649 Anemia, unspecified: Secondary | ICD-10-CM | POA: Diagnosis present

## 2014-11-24 DIAGNOSIS — K760 Fatty (change of) liver, not elsewhere classified: Secondary | ICD-10-CM | POA: Diagnosis present

## 2014-11-24 DIAGNOSIS — E1110 Type 2 diabetes mellitus with ketoacidosis without coma: Secondary | ICD-10-CM | POA: Diagnosis present

## 2014-11-24 DIAGNOSIS — E8770 Fluid overload, unspecified: Secondary | ICD-10-CM | POA: Diagnosis present

## 2014-11-24 DIAGNOSIS — I10 Essential (primary) hypertension: Secondary | ICD-10-CM | POA: Diagnosis present

## 2014-11-24 DIAGNOSIS — A419 Sepsis, unspecified organism: Principal | ICD-10-CM | POA: Diagnosis present

## 2014-11-24 DIAGNOSIS — Z91010 Allergy to peanuts: Secondary | ICD-10-CM

## 2014-11-24 DIAGNOSIS — K859 Acute pancreatitis without necrosis or infection, unspecified: Secondary | ICD-10-CM | POA: Diagnosis present

## 2014-11-24 DIAGNOSIS — E1310 Other specified diabetes mellitus with ketoacidosis without coma: Secondary | ICD-10-CM

## 2014-11-24 DIAGNOSIS — Z79899 Other long term (current) drug therapy: Secondary | ICD-10-CM | POA: Diagnosis not present

## 2014-11-24 DIAGNOSIS — Z794 Long term (current) use of insulin: Secondary | ICD-10-CM

## 2014-11-24 DIAGNOSIS — R109 Unspecified abdominal pain: Secondary | ICD-10-CM

## 2014-11-24 DIAGNOSIS — R000 Tachycardia, unspecified: Secondary | ICD-10-CM | POA: Diagnosis present

## 2014-11-24 DIAGNOSIS — Z8249 Family history of ischemic heart disease and other diseases of the circulatory system: Secondary | ICD-10-CM | POA: Diagnosis not present

## 2014-11-24 DIAGNOSIS — E876 Hypokalemia: Secondary | ICD-10-CM | POA: Diagnosis present

## 2014-11-24 DIAGNOSIS — E109 Type 1 diabetes mellitus without complications: Secondary | ICD-10-CM | POA: Diagnosis present

## 2014-11-24 DIAGNOSIS — R945 Abnormal results of liver function studies: Secondary | ICD-10-CM

## 2014-11-24 DIAGNOSIS — K769 Liver disease, unspecified: Secondary | ICD-10-CM

## 2014-11-24 DIAGNOSIS — E0810 Diabetes mellitus due to underlying condition with ketoacidosis without coma: Secondary | ICD-10-CM | POA: Diagnosis not present

## 2014-11-24 HISTORY — DX: Acute pancreatitis without necrosis or infection, unspecified: K85.90

## 2014-11-24 HISTORY — DX: Fatty (change of) liver, not elsewhere classified: K76.0

## 2014-11-24 HISTORY — DX: Hepatomegaly, not elsewhere classified: R16.0

## 2014-11-24 LAB — CBC
HCT: 44.7 % (ref 36.0–46.0)
Hemoglobin: 13.8 g/dL (ref 12.0–15.0)
MCH: 32.1 pg (ref 26.0–34.0)
MCHC: 30.9 g/dL (ref 30.0–36.0)
MCV: 104 fL — AB (ref 78.0–100.0)
PLATELETS: 444 10*3/uL — AB (ref 150–400)
RBC: 4.3 MIL/uL (ref 3.87–5.11)
RDW: 14.4 % (ref 11.5–15.5)
WBC: 17.5 10*3/uL — AB (ref 4.0–10.5)

## 2014-11-24 LAB — PROTIME-INR
INR: 1.31 (ref 0.00–1.49)
PROTHROMBIN TIME: 16.4 s — AB (ref 11.6–15.2)

## 2014-11-24 LAB — COMPREHENSIVE METABOLIC PANEL
ALBUMIN: 4.4 g/dL (ref 3.5–5.0)
ALK PHOS: 147 U/L — AB (ref 38–126)
ALT: 58 U/L — AB (ref 14–54)
AST: 84 U/L — AB (ref 15–41)
Anion gap: 26 — ABNORMAL HIGH (ref 5–15)
BUN: 14 mg/dL (ref 6–20)
CALCIUM: 9.1 mg/dL (ref 8.9–10.3)
CHLORIDE: 102 mmol/L (ref 101–111)
CO2: 9 mmol/L — AB (ref 22–32)
CREATININE: 1.09 mg/dL — AB (ref 0.44–1.00)
GFR calc Af Amer: >60 mL/min (ref >60)
GFR calc non Af Amer: >60 mL/min (ref >60)
GLUCOSE: 525 mg/dL — AB (ref 65–99)
Potassium: 4.8 mmol/L (ref 3.5–5.1)
SODIUM: 137 mmol/L (ref 135–145)
Total Bilirubin: 2.7 mg/dL — ABNORMAL HIGH (ref 0.3–1.2)
Total Protein: 8.2 g/dL — ABNORMAL HIGH (ref 6.5–8.1)

## 2014-11-24 LAB — URINALYSIS, ROUTINE W REFLEX MICROSCOPIC
BILIRUBIN URINE: NEGATIVE
HGB URINE DIPSTICK: NEGATIVE
Ketones, ur: >80 mg/dL — AB
Leukocytes, UA: NEGATIVE
Nitrite: NEGATIVE
Protein, ur: NEGATIVE mg/dL
SPECIFIC GRAVITY, URINE: 1.03 (ref 1.005–1.030)
Urobilinogen, UA: 0.2 mg/dL (ref 0.0–1.0)
pH: 5 (ref 5.0–8.0)

## 2014-11-24 LAB — CBC WITH DIFFERENTIAL/PLATELET
Basophils Absolute: 0 10*3/uL (ref 0.0–0.1)
Basophils Relative: 0 %
EOS ABS: 0.1 10*3/uL (ref 0.0–0.7)
Eosinophils Relative: 1 %
HEMATOCRIT: 44.8 % (ref 36.0–46.0)
HEMOGLOBIN: 14.8 g/dL (ref 12.0–15.0)
LYMPHS ABS: 2.7 10*3/uL (ref 0.7–4.0)
Lymphocytes Relative: 26 %
MCH: 32.9 pg (ref 26.0–34.0)
MCHC: 33 g/dL (ref 30.0–36.0)
MCV: 99.6 fL (ref 78.0–100.0)
MONO ABS: 0.8 10*3/uL (ref 0.1–1.0)
MONOS PCT: 7 %
NEUTROS PCT: 66 %
Neutro Abs: 6.8 10*3/uL (ref 1.7–7.7)
Platelets: 413 10*3/uL — ABNORMAL HIGH (ref 150–400)
RBC: 4.5 MIL/uL (ref 3.87–5.11)
RDW: 14.1 % (ref 11.5–15.5)
WBC: 10.4 10*3/uL (ref 4.0–10.5)

## 2014-11-24 LAB — PREGNANCY, URINE: Preg Test, Ur: NEGATIVE

## 2014-11-24 LAB — I-STAT CHEM 8, ED
BUN: 13 mg/dL (ref 6–20)
CHLORIDE: 105 mmol/L (ref 101–111)
CREATININE: 0.6 mg/dL (ref 0.44–1.00)
Calcium, Ion: 1.09 mmol/L — ABNORMAL LOW (ref 1.12–1.23)
GLUCOSE: 534 mg/dL — AB (ref 65–99)
HCT: 50 % — ABNORMAL HIGH (ref 36.0–46.0)
Hemoglobin: 17 g/dL — ABNORMAL HIGH (ref 12.0–15.0)
POTASSIUM: 4.7 mmol/L (ref 3.5–5.1)
Sodium: 138 mmol/L (ref 135–145)
TCO2: 10 mmol/L (ref 0–100)

## 2014-11-24 LAB — BASIC METABOLIC PANEL
Anion gap: 23 — ABNORMAL HIGH (ref 5–15)
Anion gap: 16 — ABNORMAL HIGH (ref 5–15)
Anion gap: 13 (ref 5–15)
BUN: 12 mg/dL (ref 6–20)
BUN: 10 mg/dL (ref 6–20)
BUN: 9 mg/dL (ref 6–20)
CALCIUM: 8.7 mg/dL — AB (ref 8.9–10.3)
CALCIUM: 8.1 mg/dL — AB (ref 8.9–10.3)
CO2: 7 mmol/L — AB (ref 22–32)
CO2: 7 mmol/L — AB (ref 22–32)
CO2: 10 mmol/L — AB (ref 22–32)
CREATININE: 1.1 mg/dL — AB (ref 0.44–1.00)
CREATININE: 0.81 mg/dL (ref 0.44–1.00)
CREATININE: 0.67 mg/dL (ref 0.44–1.00)
Calcium: 8 mg/dL — ABNORMAL LOW (ref 8.9–10.3)
Chloride: 115 mmol/L — ABNORMAL HIGH (ref 101–111)
Chloride: 120 mmol/L — ABNORMAL HIGH (ref 101–111)
Chloride: 120 mmol/L — ABNORMAL HIGH (ref 101–111)
GFR calc Af Amer: >60 mL/min (ref >60)
GFR calc non Af Amer: >60 mL/min (ref >60)
GLUCOSE: 327 mg/dL — AB (ref 65–99)
GLUCOSE: 180 mg/dL — AB (ref 65–99)
Glucose, Bld: 176 mg/dL — ABNORMAL HIGH (ref 65–99)
Potassium: 4.8 mmol/L (ref 3.5–5.1)
Potassium: 4.2 mmol/L (ref 3.5–5.1)
Potassium: 4.2 mmol/L (ref 3.5–5.1)
Sodium: 145 mmol/L (ref 135–145)
Sodium: 143 mmol/L (ref 135–145)
Sodium: 143 mmol/L (ref 135–145)

## 2014-11-24 LAB — GLUCOSE, CAPILLARY
GLUCOSE-CAPILLARY: 359 mg/dL — AB (ref 65–99)
GLUCOSE-CAPILLARY: 216 mg/dL — AB (ref 65–99)
GLUCOSE-CAPILLARY: 164 mg/dL — AB (ref 65–99)
GLUCOSE-CAPILLARY: 221 mg/dL — AB (ref 65–99)
GLUCOSE-CAPILLARY: 160 mg/dL — AB (ref 65–99)
GLUCOSE-CAPILLARY: 179 mg/dL — AB (ref 65–99)
GLUCOSE-CAPILLARY: 190 mg/dL — AB (ref 65–99)
Glucose-Capillary: 103 mg/dL — ABNORMAL HIGH (ref 65–99)
Glucose-Capillary: 116 mg/dL — ABNORMAL HIGH (ref 65–99)
Glucose-Capillary: 166 mg/dL — ABNORMAL HIGH (ref 65–99)
Glucose-Capillary: 456 mg/dL — ABNORMAL HIGH (ref 65–99)

## 2014-11-24 LAB — CBG MONITORING, ED
GLUCOSE-CAPILLARY: 486 mg/dL — AB (ref 65–99)
GLUCOSE-CAPILLARY: 499 mg/dL — AB (ref 65–99)
Glucose-Capillary: 402 mg/dL — ABNORMAL HIGH (ref 65–99)

## 2014-11-24 LAB — URINE MICROSCOPIC-ADD ON

## 2014-11-24 LAB — LACTIC ACID, PLASMA
LACTIC ACID, VENOUS: 4.7 mmol/L — AB (ref 0.5–2.0)
Lactic Acid, Venous: 3.9 mmol/L (ref 0.5–2.0)

## 2014-11-24 LAB — APTT: aPTT: 27 seconds (ref 24–37)

## 2014-11-24 LAB — MAGNESIUM: Magnesium: 2 mg/dL (ref 1.7–2.4)

## 2014-11-24 LAB — MRSA PCR SCREENING: MRSA by PCR: NEGATIVE

## 2014-11-24 LAB — LIPASE, BLOOD: Lipase: 30 U/L (ref 11–51)

## 2014-11-24 LAB — PROCALCITONIN: Procalcitonin: 0.21 ng/mL

## 2014-11-24 LAB — PHOSPHORUS: PHOSPHORUS: 3.7 mg/dL (ref 2.5–4.6)

## 2014-11-24 MED ORDER — DEXTROSE-NACL 5-0.45 % IV SOLN
INTRAVENOUS | Status: DC
Start: 2014-11-24 — End: 2014-11-24

## 2014-11-24 MED ORDER — PIPERACILLIN-TAZOBACTAM 3.375 G IVPB
3.3750 g | Freq: Three times a day (TID) | INTRAVENOUS | Status: DC
  Administered 2014-11-25 – 2014-11-29 (×14): 3.375 g via INTRAVENOUS
  Filled 2014-11-24 (×17): qty 50

## 2014-11-24 MED ORDER — BUDESONIDE-FORMOTEROL FUMARATE 160-4.5 MCG/ACT IN AERO
2.0000 | INHALATION_SPRAY | Freq: Two times a day (BID) | RESPIRATORY_TRACT | Status: DC
  Administered 2014-11-24 – 2014-11-26 (×2): 2 via RESPIRATORY_TRACT
  Filled 2014-11-24: qty 6

## 2014-11-24 MED ORDER — SODIUM CHLORIDE 0.9 % IV BOLUS (SEPSIS)
1000.0000 mL | Freq: Once | INTRAVENOUS | Status: AC
  Administered 2014-11-24: 1000 mL via INTRAVENOUS

## 2014-11-24 MED ORDER — VANCOMYCIN HCL IN DEXTROSE 1-5 GM/200ML-% IV SOLN
1000.0000 mg | Freq: Once | INTRAVENOUS | Status: AC
  Administered 2014-11-24: 1000 mg via INTRAVENOUS
  Filled 2014-11-24: qty 200

## 2014-11-24 MED ORDER — POTASSIUM CHLORIDE 10 MEQ/100ML IV SOLN
10.0000 meq | INTRAVENOUS | Status: AC
Start: 2014-11-24 — End: 2014-11-24
  Administered 2014-11-24 (×2): 10 meq via INTRAVENOUS
  Filled 2014-11-24 (×2): qty 100

## 2014-11-24 MED ORDER — ENOXAPARIN SODIUM 40 MG/0.4ML ~~LOC~~ SOLN
40.0000 mg | Freq: Every day | SUBCUTANEOUS | Status: DC
  Administered 2014-11-24 – 2014-11-29 (×6): 40 mg via SUBCUTANEOUS
  Filled 2014-11-24 (×7): qty 0.4

## 2014-11-24 MED ORDER — CHLORHEXIDINE GLUCONATE 0.12 % MT SOLN
15.0000 mL | Freq: Two times a day (BID) | OROMUCOSAL | Status: DC
  Administered 2014-11-24 – 2014-11-29 (×8): 15 mL via OROMUCOSAL
  Filled 2014-11-24 (×10): qty 15

## 2014-11-24 MED ORDER — SODIUM CHLORIDE 0.9 % IV SOLN
INTRAVENOUS | Status: DC
  Administered 2014-11-24: 14:00:00 via INTRAVENOUS

## 2014-11-24 MED ORDER — DEXTROSE-NACL 5-0.45 % IV SOLN
INTRAVENOUS | Status: DC
  Administered 2014-11-24 – 2014-11-25 (×3): via INTRAVENOUS
  Administered 2014-11-25: 1000 mL via INTRAVENOUS
  Administered 2014-11-25 – 2014-11-26 (×2): via INTRAVENOUS

## 2014-11-24 MED ORDER — SODIUM CHLORIDE 0.9 % IV SOLN: 1000.0000 mL | INTRAVENOUS | Status: DC

## 2014-11-24 MED ORDER — SODIUM CHLORIDE 0.9 % IV SOLN: INTRAVENOUS | Status: DC

## 2014-11-24 MED ORDER — VANCOMYCIN HCL 500 MG IV SOLR
500.0000 mg | Freq: Three times a day (TID) | INTRAVENOUS | Status: DC
  Administered 2014-11-25 – 2014-11-26 (×4): 500 mg via INTRAVENOUS
  Filled 2014-11-24 (×7): qty 500

## 2014-11-24 MED ORDER — ALBUTEROL SULFATE HFA 108 (90 BASE) MCG/ACT IN AERS: 2.0000 | INHALATION_SPRAY | Freq: Four times a day (QID) | RESPIRATORY_TRACT | Status: DC | PRN

## 2014-11-24 MED ORDER — ALBUTEROL SULFATE (2.5 MG/3ML) 0.083% IN NEBU: 2.5000 mg | INHALATION_SOLUTION | Freq: Four times a day (QID) | RESPIRATORY_TRACT | Status: DC | PRN

## 2014-11-24 MED ORDER — PROMETHAZINE HCL 25 MG/ML IJ SOLN
12.5000 mg | Freq: Four times a day (QID) | INTRAMUSCULAR | Status: DC | PRN
  Administered 2014-11-24 – 2014-11-27 (×9): 12.5 mg via INTRAVENOUS
  Filled 2014-11-24 (×11): qty 1

## 2014-11-24 MED ORDER — ONDANSETRON HCL 4 MG/2ML IJ SOLN
4.0000 mg | Freq: Once | INTRAMUSCULAR | Status: AC
  Administered 2014-11-24: 4 mg via INTRAVENOUS
  Filled 2014-11-24: qty 2

## 2014-11-24 MED ORDER — MONTELUKAST SODIUM 10 MG PO TABS
10.0000 mg | ORAL_TABLET | Freq: Every day | ORAL | Status: DC
  Administered 2014-11-24 – 2014-11-29 (×5): 10 mg via ORAL
  Filled 2014-11-24 (×7): qty 1

## 2014-11-24 MED ORDER — KETOROLAC TROMETHAMINE 30 MG/ML IJ SOLN
30.0000 mg | Freq: Four times a day (QID) | INTRAMUSCULAR | Status: AC | PRN
  Administered 2014-11-24 – 2014-11-28 (×8): 30 mg via INTRAVENOUS
  Filled 2014-11-24 (×8): qty 1

## 2014-11-24 MED ORDER — ONDANSETRON HCL 4 MG/2ML IJ SOLN
4.0000 mg | Freq: Four times a day (QID) | INTRAMUSCULAR | Status: DC | PRN
  Administered 2014-11-24 – 2014-11-27 (×10): 4 mg via INTRAVENOUS
  Filled 2014-11-24 (×10): qty 2

## 2014-11-24 MED ORDER — CETYLPYRIDINIUM CHLORIDE 0.05 % MT LIQD
7.0000 mL | Freq: Two times a day (BID) | OROMUCOSAL | Status: DC
  Administered 2014-11-24 – 2014-11-29 (×6): 7 mL via OROMUCOSAL

## 2014-11-24 MED ORDER — SODIUM CHLORIDE 0.9 % IV SOLN
INTRAVENOUS | Status: DC
  Filled 2014-11-24: qty 2.5

## 2014-11-24 MED ORDER — INSULIN REGULAR HUMAN 100 UNIT/ML IJ SOLN
INTRAMUSCULAR | Status: DC
  Administered 2014-11-24: 4.4 [IU]/h via INTRAVENOUS
  Filled 2014-11-24: qty 2.5

## 2014-11-24 MED ORDER — PROMETHAZINE HCL 25 MG/ML IJ SOLN
12.5000 mg | Freq: Once | INTRAMUSCULAR | Status: AC
  Administered 2014-11-24: 12.5 mg via INTRAVENOUS
  Filled 2014-11-24: qty 1

## 2014-11-24 MED ORDER — PIPERACILLIN-TAZOBACTAM 3.375 G IVPB 30 MIN
3.3750 g | Freq: Once | INTRAVENOUS | Status: AC
  Administered 2014-11-24: 3.375 g via INTRAVENOUS
  Filled 2014-11-24 (×2): qty 50

## 2014-11-24 NOTE — Progress Notes (Signed)
ANTIBIOTIC CONSULT NOTE - INITIAL  Pharmacy Consult for vancomycin and zosyn Indication: rule out sepsis  Allergies  Allergen Reactions  . Peanut-Containing Drug Products Swelling    Swelling of mouth, lips  . Strawberry Extract Swelling    Swelling of mouth, lips    Patient Measurements: Height: 5\' 4"  (162.6 cm) Weight: 112 lb 10.5 oz (51.1 kg) IBW/kg (Calculated) : 54.7   Vital Signs: Temp: 98.1 F (36.7 C) (11/05 1252) Temp Source: Oral (11/05 1252) BP: 133/73 mmHg (11/05 1800) Pulse Rate: 125 (11/05 1800) Intake/Output from previous day:   Intake/Output from this shift:    Labs:  Recent Labs  11/24/14 0947 11/24/14 0956 11/24/14 1416 11/24/14 1801  WBC 10.4  --  17.5*  --   HGB 14.8 17.0* 13.8  --   PLT 413*  --  444*  --   CREATININE 1.09* 0.60 1.10* 0.81   Estimated Creatinine Clearance: 85.6 mL/min (by C-G formula based on Cr of 0.81). No results for input(s): VANCOTROUGH, VANCOPEAK, VANCORANDOM, GENTTROUGH, GENTPEAK, GENTRANDOM, TOBRATROUGH, TOBRAPEAK, TOBRARND, AMIKACINPEAK, AMIKACINTROU, AMIKACIN in the last 72 hours.   Microbiology: Recent Results (from the past 720 hour(s))  MRSA PCR Screening     Status: None   Collection Time: 11/24/14 12:00 PM  Result Value Ref Range Status   MRSA by PCR NEGATIVE NEGATIVE Final    Comment:        The GeneXpert MRSA Assay (FDA approved for NASAL specimens only), is one component of a comprehensive MRSA colonization surveillance program. It is not intended to diagnose MRSA infection nor to guide or monitor treatment for MRSA infections.     Medical History: Past Medical History  Diagnosis Date  . Diabetes mellitus   . Asthma   . Hypertension   . Headache(784.0)    Assessment: Patient's a 24 y.o presented to the ED on 11/5 with c/o n/v and hematemesis.  She was subsequently found to have elevated cbgs and in DKA. LA now elevated-- to start broad abx for suspected sepsis.  Goal of Therapy:   Vancomycin trough level 15-20 mcg/ml  Plan:  - zosyn 3.375 gm IV x1 over 30 minutes, then 3.375 gm IV q8h over 4 hours - vancomycin 1gm IV x1, then 500 mg IV q8h  Branton Einstein P 11/24/2014,7:02 PM

## 2014-11-24 NOTE — ED Notes (Signed)
Pt arrived via EMS with report of "a lot" of n/v and abd pain since this morning. Pt reported having hematemesis. Last dose of Novolog and Lantus was last night. Pt was given Zofran 8mg  IV, NS 480ml, and Albuterol 5mg  for exp wheezing.

## 2014-11-24 NOTE — H&P (Signed)
History and Physical:    [name]   QHU:[id number] DOB: 04-27-1990 DOA: 11/24/2014  Referring MD/provider: Dr. Davonna Belling PCP: Coletta Memos, PA-C   Chief Complaint: Migraine headaches  History of Present Illness:   [name] is an 24 y.o. female with a PMH of type 1 diabetes, hypertension, asthma, and migraine headaches who presents with a 2 week history of migraine headaches, unrelieved by Advil and Motrin.  She says she had suffered with migraines as a teenager, but they had gone away.  No associated fevers, but has had some chills.  Developed nausea and vomiting earlier today and became concerned that she was going into DKA.  She last checked her blood sugar last night, and said it was 230 and she took her usual dose of Lantus and 10 units of NovoLOG.  She says she had an episode of DKA in September, but otherwise only has DKA 1-2 times per year.  She thinks her last hemoglobin A1c was around 11.  Denies skipping her medications. She thinks her migraine headaches are triggering the DKA. She had an episode of vomiting in the ED and ED physician thought there might be some blood in the emesis. Of note, the patient underwent EGD for suspected hematemesis 09/20/14 and this was negative. Upon initial evaluation in the ED, the patient was noted to have a bicarbonate of 9, and anion gap of 24, and a glucose of 534.  ROS:   Review of Systems  Constitutional: Positive for chills, weight loss and malaise/fatigue. Negative for fever and diaphoresis.  HENT: Positive for congestion and nosebleeds. Negative for ear discharge, ear pain, sore throat and tinnitus.   Eyes: Positive for photophobia. Negative for blurred vision, double vision and discharge.  Respiratory: Positive for cough, sputum production, shortness of breath and wheezing. Negative for stridor.        Yellow green mucous  Cardiovascular: Positive for palpitations. Negative for chest pain.  Gastrointestinal:  Positive for heartburn, nausea and vomiting. Negative for abdominal pain, diarrhea, blood in stool and melena.  Genitourinary: Positive for dysuria. Negative for frequency.  Musculoskeletal: Negative for myalgias, joint pain and neck pain.  Neurological: Positive for headaches. Negative for dizziness, speech change, loss of consciousness and weakness.  Endo/Heme/Allergies: Positive for polydipsia.  Psychiatric/Behavioral: Positive for depression. The patient is nervous/anxious.      Past Medical History:   Past Medical History  Diagnosis Date  . Diabetes mellitus   . Asthma   . Hypertension   . WSFKCLEX(517.0)     Past Surgical History:   Past Surgical History  Procedure Laterality Date  . Wisdom tooth extraction    . Esophagogastroduodenoscopy (egd) with propofol Left 09/20/2014    Procedure: ESOPHAGOGASTRODUODENOSCOPY (EGD) WITH PROPOFOL;  Surgeon: Arta Silence, MD;  Location: [facility] ENDOSCOPY;  Service: Endoscopy;  Laterality: Left;    Social History:   Social History   Social History  . Marital Status: Single    Spouse Name: N/A  . Number of Children: 1  . Years of Education: 12   Occupational History  . Unemployed.    Social History Main Topics  . Smoking status: Never Smoker   . Smokeless tobacco: Never Used  . Alcohol Use: No  . Drug Use: Yes    Special: Marijuana  . Sexual Activity: Not Currently   Other Topics Concern  . Not on file   Social History Narrative   Lives with her mother.  Unemployed.  Trying to qualify for disability.  Family history:   Family History  Problem Relation Age of Onset  . Diabetes Mother   . Hyperlipidemia Mother   . Hypertension Father   . Heart disease Father   . Heart disease Maternal Grandmother   . Heart disease Maternal Grandfather   . Hypertension Paternal Grandmother   . Cancer Paternal Grandfather     Allergies   Peanut-containing drug products and Strawberry extract  Current Medications:   Prior to  Admission medications   Medication Sig Start Date End Date Taking? Authorizing Provider  albuterol (PROVENTIL HFA;VENTOLIN HFA) 108 (90 BASE) MCG/ACT inhaler Inhale 2 puffs into the lungs every 6 (six) hours as needed for shortness of breath.   Yes Historical Provider, MD  albuterol (PROVENTIL) (2.5 MG/3ML) 0.083% nebulizer solution Take 2.5 mg by nebulization every 6 (six) hours as needed for shortness of breath.   Yes Historical Provider, MD  budesonide-formoterol (SYMBICORT) 160-4.5 MCG/ACT inhaler Inhale 2 puffs into the lungs 2 (two) times daily.   Yes Historical Provider, MD  EPINEPHrine (EPIPEN) 0.3 mg/0.3 mL SOAJ injection Inject 0.3 mg into the muscle once.   Yes Historical Provider, MD  glucagon 1 MG injection Inject 1 mg into the muscle as needed. FOR LOW BLOOD SUGAR 01/31/14 01/31/15 Yes Historical Provider, MD  insulin aspart (NOVOLOG FLEXPEN) 100 UNIT/ML FlexPen INJECT 0-20 UNITS INTO THE SKIN AS DIRECTED. SSI - CARB SCALE, 0-20 UNITS Patient taking differently: Inject into the skin 4 (four) times daily as needed for high blood sugar. Sliding scale 10/19/13  Yes Elayne Snare, MD  Insulin Glargine (LANTUS SOLOSTAR) 100 UNIT/ML Solostar Pen Inject 36 Units into the skin daily at 10 pm. 10/19/13  Yes Elayne Snare, MD  montelukast (SINGULAIR) 10 MG tablet Take 10 mg by mouth daily.   Yes Historical Provider, MD  pantoprazole (PROTONIX) 40 MG tablet Take 1 tablet (40 mg total) by mouth daily. Patient not taking: Reported on 11/24/2014 09/21/14   Florencia Reasons, MD    Physical Exam:   Filed Vitals:   11/24/14 1200 11/24/14 1252 11/24/14 1300 11/24/14 1400  BP: 102/68  152/84 149/86  Pulse: 29  141 145  Temp:  98.1 F (36.7 C)    TempSrc:  Oral    Resp: 30  24 32  Height: 5\' 4"  (1.626 m)     Weight: 51.1 kg (112 lb 10.5 oz)     SpO2: 100%  100% 100%     Physical Exam: Blood pressure 149/86, pulse 145, temperature 98.1 F (36.7 C), temperature source Oral, resp. rate 32, height 5\' 4"  (1.626 m),  weight 51.1 kg (112 lb 10.5 oz), last menstrual period 10/24/2014, SpO2 100 %. Gen: No acute distress. Ill-appearing. Head: Normocephalic, atraumatic. Eyes: PERRL, EOMI, sclerae nonicteric. Mouth: Oropharynx with dry mucous membranes. Neck: Supple, no thyromegaly, no lymphadenopathy, no jugular venous distention. Chest: Lungs are diminished but clear. CV: Heart sounds are tachycardic, regular. Abdomen: Soft, nontender, nondistended with normal active bowel sounds. Extremities: Extremities are without clubbing, edema, or cyanosis. Skin: Warm and dry. Neuro: Alert and oriented times 3; cranial nerves II through XII grossly intact. Psych: Mood and affect normal.   Data Review:    Labs: Basic Metabolic Panel:  Recent Labs Lab 11/24/14 0947 11/24/14 0956  NA 137 138  K 4.8 4.7  CL 102 105  CO2 9*  --   GLUCOSE 525* 534*  BUN 14 13  CREATININE 1.09* 0.60  CALCIUM 9.1  --    Liver Function Tests:  Recent Labs Lab  11/24/14 0947  AST 84*  ALT 58*  ALKPHOS 147*  BILITOT 2.7*  PROT 8.2*  ALBUMIN 4.4    Recent Labs Lab 11/24/14 0950  LIPASE 30   No results for input(s): AMMONIA in the last 168 hours. CBC:  Recent Labs Lab 11/24/14 0947 11/24/14 0956  WBC 10.4  --   NEUTROABS 6.8  --   HGB 14.8 17.0*  HCT 44.8 50.0*  MCV 99.6  --   PLT 413*  --    Cardiac Enzymes: No results for input(s): CKTOTAL, CKMB, CKMBINDEX, TROPONINI in the last 168 hours.  BNP (last 3 results) No results for input(s): PROBNP in the last 8760 hours. CBG:  Recent Labs Lab 11/24/14 0906 11/24/14 1038 11/24/14 1130 11/24/14 1357  GLUCAP 486* 402* 499* 359*   Urinalysis    Component Value Date/Time   COLORURINE YELLOW 11/24/2014 1045   APPEARANCEUR CLEAR 11/24/2014 1045   LABSPEC 1.030 11/24/2014 1045   PHURINE 5.0 11/24/2014 1045   GLUCOSEU >1000* 11/24/2014 1045   GLUCOSEU >=1000 11/07/2012 1205   HGBUR NEGATIVE 11/24/2014 [place] 11/24/2014 1045     KETONESUR >80* 11/24/2014 1045   PROTEINUR NEGATIVE 11/24/2014 1045   UROBILINOGEN 0.2 11/24/2014 1045   NITRITE NEGATIVE 11/24/2014 1045   LEUKOCYTESUR NEGATIVE 11/24/2014 1045     Radiographic Studies: Dg Chest Portable 1 View  11/24/2014  CLINICAL DATA:  Shortness of breath.  Pain EXAM: PORTABLE CHEST 1 VIEW COMPARISON:  09/18/2014 FINDINGS: The heart size and mediastinal contours are within normal limits. Both lungs are clear. The visualized skeletal structures are unremarkable. IMPRESSION: No active disease. Electronically Signed   By: Kerby Moors M.D.   On: 11/24/2014 10:11   *I have personally reviewed the images above*  EKG: Independently reviewed. Sinus tachycardia at 121 bpm.   Assessment/Plan:   Principal Problem:   DKA, type 1 ([place]) in a patient with uncontrolled type 1 diabetes mellitus ([place]) - Status post IV fluid bolus in the ER consisting of normal saline x2 liters, continued to vigorously hydrate with normal saline. - Start on insulin drip per glucose stabilizer protocol with every hour CBG checks and every 4 hour BMET checks until stable. - Supplement potassium if needed. Given 2 runs of potassium. - Add dextrose to IV fluids when serum glucose less than 250. - Transition to basal/bolus insulin when the ketoacidosis has resolved and the patient is able to eat. Continue IV insulin infusion for one to two hours after initiating the SQ insulin, to avoid recurrent hyperglycemia. - DM coordinator & dietician consultations.  Active Problems:   Asthma - Continue Singulair and bronchodilators as needed.    Elevated LFTs - Monitor. Consider right upper quadrant ultrasound if no improvement with hydration.    Acute kidney injury ([place]) - Secondary to osmotic diuresis. Creatinine normalized with IV fluids.    Migraine headache - Toradol ordered.  DVT prophylaxis - Lovenox ordered.  Code Status / Family Communication / Disposition Plan:   Code Status:  Full. Family Communication: Mother is emergency contact. Disposition Plan: Home when stable, likely 48 hours.  Attestation regarding necessity of inpatient status:   The appropriate admission status for this patient is INPATIENT. Inpatient status is judged to be reasonable and necessary in order to provide the required intensity of service to ensure the patient's safety. The patient's presenting symptoms, physical exam findings, and initial radiographic and laboratory data in the context of their chronic comorbidities is felt to place them at high risk  for further clinical deterioration. Furthermore, it is not anticipated that the patient will be medically stable for discharge from the hospital within 2 midnights of admission. The following factors support the admission status of inpatient.   -The patient's presenting symptoms include headache, nausea, polydipsia. - The worrisome physical exam findings include tachycardia. - The initial radiographic and laboratory data are worrisome because of metabolic acidosis with a bicarbonate of 9. - The chronic co-morbidities include poorly controlled type 1 diabetes. - Patient requires inpatient status due to high intensity of service, high risk for further deterioration and high frequency of surveillance required. - I certify that at the point of admission it is my clinical judgment that the patient will require inpatient hospital care spanning beyond 2 midnights from the point of admission.   Time spent: 70 minutes.  Anely Spiewak [facility] Pager [phone] Cell: [phone]   If 7PM-7AM, please contact night-coverage [email or web address] Password [email or web address] 11/24/2014, 2:42 PM

## 2014-11-24 NOTE — Progress Notes (Signed)
CRITICAL VALUE ALERT  Critical value received:  Lactic Acid of 4.7  Date of notification:  11/24/14  Time of notification:  9093  Critical value read back:Yes.    Nurse who received alert:  Gladys Damme  MD notified (1st page):  Rama, C  Time of first page:  32  MD notified (2nd page):  Time of second page:  Responding MD:  Isa Rankin  Time MD responded:  [phone]

## 2014-11-24 NOTE — ED Provider Notes (Signed)
CSN: [id number]     Arrival date & time 11/24/14  8527 History   First MD Initiated Contact with Patient 11/24/14 [phone]     Chief Complaint  Patient presents with  . Diabetic Ketoacidosis     (Consider location/radiation/quality/duration/timing/severity/associated sxs/prior Treatment) The history is provided by the patient.   patient presents with nausea vomiting and suspected DKA. States she's had a headache and has been throwing up. States she feels like a migraine. She checked her sugar last night was around 200. She states today that is higher. 500 upon arrival. She has abdominal pain. She also has had some brown emesis and here vomited some red blood. She denies possibility of pregnancy. States she's had some chills.  Past Medical History  Diagnosis Date  . Diabetes mellitus   . Asthma   . Hypertension   . MPNTIRWE(315.4)    Past Surgical History  Procedure Laterality Date  . Wisdom tooth extraction    . Esophagogastroduodenoscopy (egd) with propofol Left 09/20/2014    Procedure: ESOPHAGOGASTRODUODENOSCOPY (EGD) WITH PROPOFOL;  Surgeon: Arta Silence, MD;  Location: [facility]COPY;  Service: Endoscopy;  Laterality: Left;   Family History  Problem Relation Age of Onset  . Diabetes Mother   . Hyperlipidemia Mother   . Hypertension Father   . Heart disease Father   . Heart disease Maternal Grandmother   . Heart disease Maternal Grandfather   . Hypertension Paternal Grandmother   . Cancer Paternal Grandfather    Social History  Substance Use Topics  . Smoking status: Never Smoker   . Smokeless tobacco: Never Used  . Alcohol Use: No   OB History    Gravida Para Term Preterm AB TAB SAB Ectopic Multiple Living   2 1 0 1 1 1 0 0 0 1      Review of Systems  Constitutional: Positive for chills and appetite change. Negative for activity change.  Eyes: Negative for pain.  Respiratory: Positive for shortness of breath. Negative for chest tightness.   Cardiovascular: Negative  for chest pain and leg swelling.  Gastrointestinal: Positive for nausea and abdominal pain. Negative for vomiting and diarrhea.  Genitourinary: Negative for flank pain.  Musculoskeletal: Negative for back pain and neck stiffness.  Skin: Negative for rash.  Neurological: Positive for headaches. Negative for weakness and numbness.  Psychiatric/Behavioral: Negative for behavioral problems.      Allergies  Peanut-containing drug products and Strawberry extract  Home Medications   Prior to Admission medications   Medication Sig Start Date End Date Taking? Authorizing Provider  albuterol (PROVENTIL HFA;VENTOLIN HFA) 108 (90 BASE) MCG/ACT inhaler Inhale 2 puffs into the lungs every 6 (six) hours as needed for shortness of breath.   Yes Historical Provider, MD  albuterol (PROVENTIL) (2.5 MG/3ML) 0.083% nebulizer solution Take 2.5 mg by nebulization every 6 (six) hours as needed for shortness of breath.   Yes Historical Provider, MD  budesonide-formoterol (SYMBICORT) 160-4.5 MCG/ACT inhaler Inhale 2 puffs into the lungs 2 (two) times daily.   Yes Historical Provider, MD  EPINEPHrine (EPIPEN) 0.3 mg/0.3 mL SOAJ injection Inject 0.3 mg into the muscle once.   Yes Historical Provider, MD  glucagon 1 MG injection Inject 1 mg into the muscle as needed. FOR LOW BLOOD SUGAR 01/31/14 01/31/15 Yes Historical Provider, MD  insulin aspart (NOVOLOG FLEXPEN) 100 UNIT/ML FlexPen INJECT 0-20 UNITS INTO THE SKIN AS DIRECTED. SSI - CARB SCALE, 0-20 UNITS Patient taking differently: Inject into the skin 4 (four) times daily as needed for high blood  sugar. Sliding scale 10/19/13  Yes Elayne Snare, MD  Insulin Glargine (LANTUS SOLOSTAR) 100 UNIT/ML Solostar Pen Inject 36 Units into the skin daily at 10 pm. 10/19/13  Yes Elayne Snare, MD  montelukast (SINGULAIR) 10 MG tablet Take 10 mg by mouth daily.   Yes Historical Provider, MD  pantoprazole (PROTONIX) 40 MG tablet Take 1 tablet (40 mg total) by mouth daily. Patient not  taking: Reported on 11/24/2014 09/21/14   Florencia Reasons, MD   BP 120/64 mmHg  Pulse 128  Temp(Src) 97.8 F (36.6 C) (Oral)  Resp 20  Ht 5\' 4"  (1.626 m)  Wt 111 lb 4.8 oz (50.485 kg)  BMI 19.10 kg/m2  SpO2 100%  LMP 10/24/2014 Physical Exam  Constitutional: She appears well-developed.  Patient appears uncomfortable  HENT:  Head: Atraumatic.  Neck: Neck supple.  Cardiovascular:  Tachycardia  Pulmonary/Chest:  Tachypnea  Abdominal: Soft.  Mild diffuse tenderness  Musculoskeletal: Normal range of motion.  Neurological: She is alert.  Skin: Skin is warm.    ED Course  Procedures (including critical care time) Labs Review Labs Reviewed  COMPREHENSIVE METABOLIC PANEL - Abnormal; Notable for the following:    CO2 9 (*)    Glucose, Bld 525 (*)    Creatinine, Ser 1.09 (*)    Total Protein 8.2 (*)    AST 84 (*)    ALT 58 (*)    Alkaline Phosphatase 147 (*)    Total Bilirubin 2.7 (*)    Anion gap 26 (*)    All other components within normal limits  BLOOD GAS, VENOUS - Abnormal; Notable for the following:    pH, Ven 7.319 (*)    pO2, Ven 66.6 (*)    All other components within normal limits  CBC WITH DIFFERENTIAL/PLATELET - Abnormal; Notable for the following:    Platelets 413 (*)    All other components within normal limits  URINALYSIS, ROUTINE W REFLEX MICROSCOPIC (NOT AT [facility]) - Abnormal; Notable for the following:    Glucose, UA >1000 (*)    Ketones, ur >80 (*)    All other components within normal limits  URINE MICROSCOPIC-ADD ON - Abnormal; Notable for the following:    Squamous Epithelial / LPF MANY (*)    Bacteria, UA FEW (*)    All other components within normal limits  CBG MONITORING, ED - Abnormal; Notable for the following:    Glucose-Capillary 486 (*)    All other components within normal limits  I-STAT CHEM 8, ED - Abnormal; Notable for the following:    Glucose, Bld 534 (*)    Calcium, Ion 1.09 (*)    Hemoglobin 17.0 (*)    HCT 50.0 (*)    All other  components within normal limits  CBG MONITORING, ED - Abnormal; Notable for the following:    Glucose-Capillary 402 (*)    All other components within normal limits  PREGNANCY, URINE  LIPASE, BLOOD  BLOOD GAS, VENOUS    Imaging Review Dg Chest Portable 1 View  11/24/2014  CLINICAL DATA:  Shortness of breath.  Pain EXAM: PORTABLE CHEST 1 VIEW COMPARISON:  09/18/2014 FINDINGS: The heart size and mediastinal contours are within normal limits. Both lungs are clear. The visualized skeletal structures are unremarkable. IMPRESSION: No active disease. Electronically Signed   By: Kerby Moors M.D.   On: 11/24/2014 10:11   I have personally reviewed and evaluated these images and lab results as part of my medical decision-making.   EKG Interpretation   Date/Time:  Saturday November 24 2014 09:09:13 EDT Ventricular Rate:  121 PR Interval:  128 QRS Duration: 79 QT Interval:  330 QTC Calculation: 468 R Axis:   76 Text Interpretation:  Sinus tachycardia Consider right atrial enlargement  Confirmed by Alvino Chapel  MD, Ovid Curd [id number]) on 11/24/2014 11:13:27 AM      MDM   Final diagnoses:  Diabetic ketoacidosis without coma associated with other specified diabetes mellitus ([place])    Patient presents with nausea vomiting. History of same with her DKA. Appears to be in DKA. There has been an apparent lab error and what shows as the venous gases actually on a different patient. Patient's actual venous pH is 7.15 with a bicarbonate of 11, not what shows in the results. Will admit to stepdown bed. IV inisultin started and fluid boluses given.   CRITICAL CARE Performed by: Mackie Pai Total critical care time: 30 minutes Critical care time was exclusive of separately billable procedures and treating other patients. Critical care was necessary to treat or prevent imminent or life-threatening deterioration. Critical care was time spent personally by me on the following activities: development  of treatment plan with patient and/or surrogate as well as nursing, discussions with consultants, evaluation of patient's response to treatment, examination of patient, obtaining history from patient or surrogate, ordering and performing treatments and interventions, ordering and review of laboratory studies, ordering and review of radiographic studies, pulse oximetry and re-evaluation of patient's condition.     Davonna Belling, MD 11/24/14 1113

## 2014-11-24 NOTE — ED Notes (Signed)
Bed: QH60 Expected date: 11/24/14 Expected time:  Means of arrival:  Comments: Ems vomiting

## 2014-11-25 ENCOUNTER — Inpatient Hospital Stay (HOSPITAL_COMMUNITY): Payer: BLUE CROSS/BLUE SHIELD

## 2014-11-25 DIAGNOSIS — R000 Tachycardia, unspecified: Secondary | ICD-10-CM

## 2014-11-25 DIAGNOSIS — A419 Sepsis, unspecified organism: Principal | ICD-10-CM

## 2014-11-25 DIAGNOSIS — R748 Abnormal levels of other serum enzymes: Secondary | ICD-10-CM | POA: Diagnosis present

## 2014-11-25 LAB — COMPREHENSIVE METABOLIC PANEL
ALK PHOS: 102 U/L (ref 38–126)
ALT: 36 U/L (ref 14–54)
AST: 51 U/L — AB (ref 15–41)
Albumin: 3.1 g/dL — ABNORMAL LOW (ref 3.5–5.0)
Anion gap: 13 (ref 5–15)
BILIRUBIN TOTAL: 0.8 mg/dL (ref 0.3–1.2)
BUN: 5 mg/dL — AB (ref 6–20)
CHLORIDE: 117 mmol/L — AB (ref 101–111)
CO2: 10 mmol/L — ABNORMAL LOW (ref 22–32)
Calcium: 7.9 mg/dL — ABNORMAL LOW (ref 8.9–10.3)
Creatinine, Ser: 0.65 mg/dL (ref 0.44–1.00)
Glucose, Bld: 193 mg/dL — ABNORMAL HIGH (ref 65–99)
Potassium: 3.7 mmol/L (ref 3.5–5.1)
Sodium: 140 mmol/L (ref 135–145)
Total Protein: 6 g/dL — ABNORMAL LOW (ref 6.5–8.1)

## 2014-11-25 LAB — BASIC METABOLIC PANEL
Anion gap: 11 (ref 5–15)
Anion gap: 17 — ABNORMAL HIGH (ref 5–15)
Anion gap: 19 — ABNORMAL HIGH (ref 5–15)
Anion gap: 15 (ref 5–15)
Anion gap: 11 (ref 5–15)
BUN: 7 mg/dL (ref 6–20)
BUN: <5 mg/dL — ABNORMAL LOW (ref 6–20)
CALCIUM: 7.2 mg/dL — AB (ref 8.9–10.3)
CALCIUM: 7.8 mg/dL — AB (ref 8.9–10.3)
CHLORIDE: 111 mmol/L (ref 101–111)
CO2: 11 mmol/L — ABNORMAL LOW (ref 22–32)
CO2: 11 mmol/L — AB (ref 22–32)
CO2: 10 mmol/L — ABNORMAL LOW (ref 22–32)
CO2: 14 mmol/L — ABNORMAL LOW (ref 22–32)
CO2: 20 mmol/L — ABNORMAL LOW (ref 22–32)
CREATININE: 0.59 mg/dL (ref 0.44–1.00)
CREATININE: 0.6 mg/dL (ref 0.44–1.00)
CREATININE: 0.62 mg/dL (ref 0.44–1.00)
CREATININE: 0.65 mg/dL (ref 0.44–1.00)
Calcium: 8 mg/dL — ABNORMAL LOW (ref 8.9–10.3)
Calcium: 7.9 mg/dL — ABNORMAL LOW (ref 8.9–10.3)
Calcium: 7.2 mg/dL — ABNORMAL LOW (ref 8.9–10.3)
Chloride: 119 mmol/L — ABNORMAL HIGH (ref 101–111)
Chloride: 107 mmol/L (ref 101–111)
Chloride: 111 mmol/L (ref 101–111)
Chloride: 108 mmol/L (ref 101–111)
Creatinine, Ser: 0.6 mg/dL (ref 0.44–1.00)
GFR calc non Af Amer: >60 mL/min (ref >60)
GLUCOSE: 154 mg/dL — AB (ref 65–99)
Glucose, Bld: 244 mg/dL — ABNORMAL HIGH (ref 65–99)
Glucose, Bld: 284 mg/dL — ABNORMAL HIGH (ref 65–99)
Glucose, Bld: 137 mg/dL — ABNORMAL HIGH (ref 65–99)
Glucose, Bld: 144 mg/dL — ABNORMAL HIGH (ref 65–99)
POTASSIUM: 3.6 mmol/L (ref 3.5–5.1)
POTASSIUM: 3.4 mmol/L — AB (ref 3.5–5.1)
Potassium: 3.8 mmol/L (ref 3.5–5.1)
Potassium: 3.3 mmol/L — ABNORMAL LOW (ref 3.5–5.1)
Potassium: 2.3 mmol/L — CL (ref 3.5–5.1)
SODIUM: 139 mmol/L (ref 135–145)
SODIUM: 136 mmol/L (ref 135–145)
SODIUM: 140 mmol/L (ref 135–145)
SODIUM: 139 mmol/L (ref 135–145)
Sodium: 141 mmol/L (ref 135–145)

## 2014-11-25 LAB — BLOOD GAS, ARTERIAL
ACID-BASE DEFICIT: 20.4 mmol/L — AB (ref 0.0–2.0)
ACID-BASE DEFICIT: 3.3 mmol/L — AB (ref 0.0–2.0)
Bicarbonate: 6.5 mEq/L — ABNORMAL LOW (ref 20.0–24.0)
Bicarbonate: 19.2 mEq/L — ABNORMAL LOW (ref 20.0–24.0)
Drawn by: 232811
Drawn by: 232811
FIO2: 0.21
FIO2: 0.21
O2 SAT: 98.3 %
O2 SAT: 98.5 %
PATIENT TEMPERATURE: 98
PATIENT TEMPERATURE: 99.9
PCO2 ART: 17.5 mmHg — AB (ref 35.0–45.0)
PO2 ART: 127 mmHg — AB (ref 80.0–100.0)
PO2 ART: 105 mmHg — AB (ref 80.0–100.0)
TCO2: 6.2 mmol/L (ref 0–100)
TCO2: 17.5 mmol/L (ref 0–100)
pCO2 arterial: 29.1 mmHg — ABNORMAL LOW (ref 35.0–45.0)
pH, Arterial: 7.193 — CL (ref 7.350–7.450)
pH, Arterial: 7.44 (ref 7.350–7.450)

## 2014-11-25 LAB — LIPASE, BLOOD
LIPASE: 386 U/L — AB (ref 11–51)
LIPASE: 343 U/L — AB (ref 11–51)

## 2014-11-25 LAB — CBC WITH DIFFERENTIAL/PLATELET
BASOS ABS: 0 10*3/uL (ref 0.0–0.1)
BASOS PCT: 0 %
EOS ABS: 0.1 10*3/uL (ref 0.0–0.7)
EOS PCT: 2 %
HCT: 33.6 % — ABNORMAL LOW (ref 36.0–46.0)
HEMOGLOBIN: 11.2 g/dL — AB (ref 12.0–15.0)
LYMPHS ABS: 1.1 10*3/uL (ref 0.7–4.0)
Lymphocytes Relative: 14 %
MCH: 31.5 pg (ref 26.0–34.0)
MCHC: 33.3 g/dL (ref 30.0–36.0)
MCV: 94.6 fL (ref 78.0–100.0)
Monocytes Absolute: 0.9 10*3/uL (ref 0.1–1.0)
Monocytes Relative: 12 %
NEUTROS PCT: 72 %
Neutro Abs: 5.5 10*3/uL (ref 1.7–7.7)
PLATELETS: 281 10*3/uL (ref 150–400)
RBC: 3.55 MIL/uL — AB (ref 3.87–5.11)
RDW: 13.7 % (ref 11.5–15.5)
WBC: 7.6 10*3/uL (ref 4.0–10.5)

## 2014-11-25 LAB — CBC
HEMATOCRIT: 43.2 % (ref 36.0–46.0)
HEMOGLOBIN: 13.8 g/dL (ref 12.0–15.0)
MCH: 32.2 pg (ref 26.0–34.0)
MCHC: 31.9 g/dL (ref 30.0–36.0)
MCV: 100.7 fL — ABNORMAL HIGH (ref 78.0–100.0)
Platelets: 317 10*3/uL (ref 150–400)
RBC: 4.29 MIL/uL (ref 3.87–5.11)
RDW: 14.4 % (ref 11.5–15.5)
WBC: 10.7 10*3/uL — AB (ref 4.0–10.5)

## 2014-11-25 LAB — CK TOTAL AND CKMB (NOT AT ARMC)
CK TOTAL: 41 U/L (ref 38–234)
CK, MB: 5.1 ng/mL — AB (ref 0.5–5.0)
Relative Index: INVALID (ref 0.0–2.5)

## 2014-11-25 LAB — GLUCOSE, CAPILLARY
GLUCOSE-CAPILLARY: 118 mg/dL — AB (ref 65–99)
GLUCOSE-CAPILLARY: 131 mg/dL — AB (ref 65–99)
GLUCOSE-CAPILLARY: 135 mg/dL — AB (ref 65–99)
GLUCOSE-CAPILLARY: 136 mg/dL — AB (ref 65–99)
GLUCOSE-CAPILLARY: 151 mg/dL — AB (ref 65–99)
GLUCOSE-CAPILLARY: 160 mg/dL — AB (ref 65–99)
GLUCOSE-CAPILLARY: 160 mg/dL — AB (ref 65–99)
GLUCOSE-CAPILLARY: 169 mg/dL — AB (ref 65–99)
GLUCOSE-CAPILLARY: 171 mg/dL — AB (ref 65–99)
GLUCOSE-CAPILLARY: 198 mg/dL — AB (ref 65–99)
GLUCOSE-CAPILLARY: 207 mg/dL — AB (ref 65–99)
GLUCOSE-CAPILLARY: 215 mg/dL — AB (ref 65–99)
GLUCOSE-CAPILLARY: 216 mg/dL — AB (ref 65–99)
GLUCOSE-CAPILLARY: 221 mg/dL — AB (ref 65–99)
GLUCOSE-CAPILLARY: 248 mg/dL — AB (ref 65–99)
GLUCOSE-CAPILLARY: 292 mg/dL — AB (ref 65–99)
GLUCOSE-CAPILLARY: 325 mg/dL — AB (ref 65–99)
GLUCOSE-CAPILLARY: 97 mg/dL (ref 65–99)
Glucose-Capillary: 135 mg/dL — ABNORMAL HIGH (ref 65–99)
Glucose-Capillary: 137 mg/dL — ABNORMAL HIGH (ref 65–99)
Glucose-Capillary: 174 mg/dL — ABNORMAL HIGH (ref 65–99)
Glucose-Capillary: 176 mg/dL — ABNORMAL HIGH (ref 65–99)
Glucose-Capillary: 238 mg/dL — ABNORMAL HIGH (ref 65–99)

## 2014-11-25 LAB — PROCALCITONIN
PROCALCITONIN: 0.15 ng/mL
PROCALCITONIN: 0.17 ng/mL

## 2014-11-25 LAB — ACETAMINOPHEN LEVEL: Acetaminophen (Tylenol), Serum: <10 ug/mL — ABNORMAL LOW (ref 10–30)

## 2014-11-25 LAB — TROPONIN I
Troponin I: <0.03 ng/mL (ref <0.031)
Troponin I: <0.03 ng/mL (ref <0.031)

## 2014-11-25 LAB — PHOSPHORUS: PHOSPHORUS: 1.9 mg/dL — AB (ref 2.5–4.6)

## 2014-11-25 LAB — AMYLASE
AMYLASE: 378 U/L — AB (ref 28–100)
Amylase: 392 U/L — ABNORMAL HIGH (ref 28–100)

## 2014-11-25 LAB — LACTIC ACID, PLASMA
LACTIC ACID, VENOUS: 4 mmol/L — AB (ref 0.5–2.0)
Lactic Acid, Venous: 4.4 mmol/L (ref 0.5–2.0)
Lactic Acid, Venous: 4.7 mmol/L (ref 0.5–2.0)
Lactic Acid, Venous: 5.6 mmol/L (ref 0.5–2.0)
Lactic Acid, Venous: 7.5 mmol/L (ref 0.5–2.0)

## 2014-11-25 LAB — MAGNESIUM: Magnesium: 1.5 mg/dL — ABNORMAL LOW (ref 1.7–2.4)

## 2014-11-25 MED ORDER — SODIUM BICARBONATE 8.4 % IV SOLN
INTRAVENOUS | Status: DC
  Administered 2014-11-25 (×2): via INTRAVENOUS
  Filled 2014-11-25 (×2): qty 150

## 2014-11-25 MED ORDER — SODIUM CHLORIDE 0.9 % IV SOLN
Freq: Once | INTRAVENOUS | Status: AC
  Administered 2014-11-25: 02:00:00 via INTRAVENOUS

## 2014-11-25 MED ORDER — METOCLOPRAMIDE HCL 5 MG/ML IJ SOLN
5.0000 mg | Freq: Four times a day (QID) | INTRAMUSCULAR | Status: AC
  Administered 2014-11-25 – 2014-11-26 (×4): 5 mg via INTRAVENOUS
  Filled 2014-11-25 (×4): qty 2

## 2014-11-25 MED ORDER — MAGNESIUM SULFATE 2 GM/50ML IV SOLN
2.0000 g | Freq: Once | INTRAVENOUS | Status: AC
  Administered 2014-11-25: 2 g via INTRAVENOUS
  Filled 2014-11-25: qty 50

## 2014-11-25 MED ORDER — POTASSIUM CHLORIDE 10 MEQ/100ML IV SOLN: 10.0000 meq | INTRAVENOUS | Status: DC

## 2014-11-25 MED ORDER — IOHEXOL 300 MG/ML  SOLN
100.0000 mL | Freq: Once | INTRAMUSCULAR | Status: AC | PRN
  Administered 2014-11-25: 100 mL via INTRAVENOUS

## 2014-11-25 MED ORDER — INFLUENZA VAC SPLIT QUAD 0.5 ML IM SUSY
0.5000 mL | PREFILLED_SYRINGE | INTRAMUSCULAR | Status: AC
  Administered 2014-11-26: 0.5 mL via INTRAMUSCULAR
  Filled 2014-11-25 (×2): qty 0.5

## 2014-11-25 MED ORDER — ENSURE ENLIVE PO LIQD: 237.0000 mL | Freq: Two times a day (BID) | ORAL | Status: DC

## 2014-11-25 MED ORDER — SODIUM CHLORIDE 0.9 % IV BOLUS (SEPSIS): 2000.0000 mL | Freq: Once | INTRAVENOUS | Status: AC

## 2014-11-25 MED ORDER — SODIUM CHLORIDE 0.9 % IV BOLUS (SEPSIS)
2000.0000 mL | Freq: Once | INTRAVENOUS | Status: AC
  Administered 2014-11-25: 2000 mL via INTRAVENOUS

## 2014-11-25 MED ORDER — POTASSIUM CHLORIDE 10 MEQ/100ML IV SOLN
10.0000 meq | INTRAVENOUS | Status: AC
  Administered 2014-11-25 – 2014-11-26 (×4): 10 meq via INTRAVENOUS
  Filled 2014-11-25 (×4): qty 100

## 2014-11-25 MED ORDER — POTASSIUM PHOSPHATES 15 MMOLE/5ML IV SOLN
20.0000 mmol | Freq: Once | INTRAVENOUS | Status: AC
  Administered 2014-11-25: 20 mmol via INTRAVENOUS
  Filled 2014-11-25: qty 6.67

## 2014-11-25 MED ORDER — SODIUM BICARBONATE 8.4 % IV SOLN
100.0000 meq | Freq: Once | INTRAVENOUS | Status: AC
  Administered 2014-11-25: 100 meq via INTRAVENOUS
  Filled 2014-11-25: qty 100

## 2014-11-25 MED ORDER — SODIUM CHLORIDE 0.9 % IV BOLUS (SEPSIS)
1000.0000 mL | Freq: Once | INTRAVENOUS | Status: AC
  Administered 2014-11-25: 1000 mL via INTRAVENOUS

## 2014-11-25 NOTE — Progress Notes (Signed)
MEDICATION RELATED CONSULT NOTE - INITIAL   Pharmacy Consult for Phosphorous replacment  Allergies  Allergen Reactions  . Peanut-Containing Drug Products Swelling    Swelling of mouth, lips  . Strawberry Extract Swelling    Swelling of mouth, lips    Patient Measurements: Height: 5\' 4"  (162.6 cm) Weight: 126 lb 8.7 oz (57.4 kg) IBW/kg (Calculated) : 54.7 Adjusted Body Weight:   Vital Signs: Temp: 98.9 F (37.2 C) (11/06 0500) Temp Source: Oral (11/06 0500) BP: 133/92 mmHg (11/06 0700) Pulse Rate: 110 (11/06 0700) Intake/Output from previous day: 11/05 0701 - 11/06 0700 In: 3226.7 [P.O.:470; I.V.:2706.7; IV Piggyback:50] Out: 1681 [Urine:1250; Emesis/NG output:430; Stool:1] Intake/Output from this shift:    Labs:  Recent Labs  11/24/14 0947 11/24/14 0956 11/24/14 1416  11/24/14 1919 11/24/14 2020 11/25/14 0035 11/25/14 0346 11/25/14 0624  WBC 10.4  --  17.5*  --   --   --   --   --  10.7*  HGB 14.8 17.0* 13.8  --   --   --   --   --  13.8  HCT 44.8 50.0* 44.7  --   --   --   --   --  43.2  PLT 413*  --  444*  --   --   --   --   --  317  APTT  --   --   --   --  27  --   --   --   --   CREATININE 1.09* 0.60 1.10*  < >  --  0.67 0.65 0.65  --   MG  --   --  2.0  --   --   --   --   --  1.5*  PHOS  --   --  3.7  --   --   --   --   --  1.9*  ALBUMIN 4.4  --   --   --   --   --   --  3.1*  --   PROT 8.2*  --   --   --   --   --   --  6.0*  --   AST 84*  --   --   --   --   --   --  51*  --   ALT 58*  --   --   --   --   --   --  36  --   ALKPHOS 147*  --   --   --   --   --   --  102  --   BILITOT 2.7*  --   --   --   --   --   --  0.8  --   < > = values in this interval not displayed. Estimated Creatinine Clearance: 92.8 mL/min (by C-G formula based on Cr of 0.65).    Medical History: Past Medical History  Diagnosis Date  . Diabetes mellitus   . Asthma   . Hypertension   . Headache(784.0)     Assessment: 56 YOF presents with headache, found to be  in DKA.  Phos this am lost at 1.9, pharmacy asked to replace.  Goal of Therapy:  Replete phos  Plan:   Potassium phosphate 61mmol IVPB x 1  This will provide 30 meq K+, so d/c KCl runs x 3  Doreene Eland, PharmD, BCPS.   Pager: [phone] 11/25/2014 7:37 AM

## 2014-11-25 NOTE — Progress Notes (Signed)
CRITICAL VALUE ALERT  Critical value received: lactic acid 4.4 Date of notification:  11/25/14  Time of notification:  0156  Critical value read back:Yes.    Nurse who received alert:  H.peng  MD notified (1st page):  [place]  Time of first page:  0156  MD notified (2nd page):  Time of second page:  Responding MD:  [place]  Time MD responded:  0200

## 2014-11-25 NOTE — Progress Notes (Signed)
Utilization Review Completed.Koleson Reifsteck T11/06/2014  

## 2014-11-25 NOTE — Progress Notes (Signed)
Initial Nutrition Assessment  DOCUMENTATION CODES:   Not applicable  INTERVENTION:  Ensure Enlive po BID, each supplement provides 350 kcal and 20 grams of protein  NUTRITION DIAGNOSIS:   Inadequate oral intake related to poor appetite (metabolic acidosis) as evidenced by per patient/family report.  GOAL:   Patient will meet greater than or equal to 90% of their needs  MONITOR:   I & O's, PO intake, Labs, Supplement acceptance  REASON FOR ASSESSMENT:   Consult Assessment of nutrition requirement/status  ASSESSMENT:   [name] is an 24 y.o. female with a PMH of type 1 diabetes, hypertension, asthma, and migraine headaches who presents with a 2 week history of migraine headaches, unrelieved by Advil and Motrin. She says she had suffered with migraines as a teenager, but they had gone away. No associated fevers, but has had some chills. Developed nausea and vomiting earlier today and became concerned that she was going into DKA. She last checked her blood sugar last night, and said it was 230  Spoke with pt at bedside.  She admits to poor appetite due to migraines, and feelings of DKA.  Nutrition-Focused physical exam completed. Findings are mild fat depletion, no muscle depletion, and no edema.   No other nutrition issues at this point in time.  Will provide Ensure Enlive for extra calories during stay. Follow for acceptance  Diet Order:  Diet clear liquid Room service appropriate?: Yes; Fluid consistency:: Thin  Skin:  Reviewed, no issues  Last BM:  11/25/2014  Height:   Ht Readings from Last 1 Encounters:  11/24/14 5\' 4"  (1.626 m)    Weight:   Wt Readings from Last 1 Encounters:  11/25/14 126 lb 8.7 oz (57.4 kg)    Ideal Body Weight:  54.54 kg  BMI:  Body mass index is 21.71 kg/(m^2).  Estimated Nutritional Needs:   Kcal:  1700-1900 calories  Protein:  46 - 57 grams  Fluid:  >/= 1.7L  EDUCATION NEEDS:   No education needs identified  at this time  Satira Anis. Jaceon Heiberger, MS, RD LDN After Hours/Weekend Pager [phone]

## 2014-11-25 NOTE — Progress Notes (Signed)
CRITICAL VALUE ALERT  Critical value received:  Lactic acid 3.9  Date of notification:  11/24/14  Time of notification:  2140  Critical value read back:Yes.    Nurse who received alert:  H. Annamary Carolin  MD notified (1st page):  [place]  Time of first page:  2140  MD notified (2nd page):  Time of second page:  Responding MD:  na  Time MD responded:  na

## 2014-11-25 NOTE — Progress Notes (Signed)
CRITICAL VALUE ALERT  Critical value received lactic acid 4.7  Date of notification:  11/25/14  Time of notification:  1245  Critical value read back:Yes.    Nurse who received alert:  H. Annamary Carolin  MD notified (1st page):  [place]  Time of first page:  0451  MD notified (2nd page):  Time of second page:  Responding MD:  [place]  Time MD responded:  8099 and elink called

## 2014-11-25 NOTE — Progress Notes (Signed)
Progress Note   Dana Debo ZOX:[id number] DOB: 1990/08/05 DOA: 11/24/2014 PCP: Coletta Memos, PA-C   Brief Narrative:   [name] is an 24 y.o. female with a PMH of type 1 diabetes, hypertension, asthma, and migraine headaches who was admitted 11/24/14 with a 2 week history of migraine headaches and DKA. Upon initial workup, the patient was noted to have an elevated lactic acid worrisome for sepsis of unclear etiology.  Assessment/Plan:   Principal Problem:  DKA, type 1 ([place]) in a patient with uncontrolled type 1 diabetes mellitus ([place]) / sepsis - Status post IV fluid bolus in the ER consisting of normal saline x2 liters, followed by an additional 5 L overnight. - Continue insulin drip per glucose stabilizer protocol with every hour CBG checks. CBGs 118-248. - Continue every 4 hour BMET checks until acidosis resolved and anion gap closed. - Supplement potassium if needed. Given 2 runs of potassium. We'll give an additional 3 runs today. - Felt to be septic due to elevated lactic acid, but no clear source. Abdominal ultrasound ordered given elevated LFTs. - Started on empiric vancomycin and Zosyn 11/24/14. Follow-up blood cultures. Pro calcitonin reassuring. - Add dextrose to IV fluids when serum glucose less than 250. - Transition to basal/bolus insulin when the ketoacidosis has resolved and the patient is able to eat.  - Continue IV insulin infusion for one to two hours after initiating the SQ insulin, to avoid recurrent hyperglycemia. - DM coordinator & dietician consultations.  Active Problems:   Sinus tachycardia - Secondary sepsis. Monitor on telemetry. Troponin not elevated.    Elevated lipase/amylase - Abdominal ultrasound pending. Bowel rest.    Hypomagnesemia/hypophosphatemia - We'll give 2 g of IV magnesium and replace phosphorus.   Asthma - Continue Singulair and bronchodilators as needed.   Elevated LFTs - Monitor. Follow-up right upper  quadrant ultrasound. LFTs have improved with hydration. - Tylenol level not elevated.   Acute kidney injury ([place]) - Secondary to osmotic diuresis. Creatinine normalized with IV fluids.   Migraine headache - Toradol ordered.    DVT prophylaxis - Lovenox ordered.   Family Communication/Anticipated D/C date and plan/Code Status   Family Communication: No family at bedside, mother will be by later today, instructed her to have the RN page me if she'd like to speak with me. Disposition Plan: Home when DKA resolves and sepsis treated. Anticipated D/C date:   3 days. Code Status:     Code Status Orders        Start     Ordered   11/24/14 1308  Full code   Continuous     11/24/14 1309       IV Access:    Peripheral IV   Procedures and diagnostic studies:   Dg Chest Portable 1 View  11/24/2014  CLINICAL DATA:  Shortness of breath.  Pain EXAM: PORTABLE CHEST 1 VIEW COMPARISON:  09/18/2014 FINDINGS: The heart size and mediastinal contours are within normal limits. Both lungs are clear. The visualized skeletal structures are unremarkable. IMPRESSION: No active disease. Electronically Signed   By: Kerby Moors M.D.   On: 11/24/2014 10:11   Dg Abd Portable 1v  11/25/2014  CLINICAL DATA:  Generalized abdominal pain EXAM: PORTABLE ABDOMEN - 1 VIEW COMPARISON:  None. FINDINGS: The bowel gas pattern is normal. No radio-opaque calculi or other significant radiographic abnormality are seen. IMPRESSION: Negative. Electronically Signed   By: Andreas Newport M.D.   On: 11/25/2014 05:44     Medical Consultants:  PCCM  Anti-Infectives:   Anti-infectives    Start     Dose/Rate Route Frequency Ordered Stop   11/25/14 0500  vancomycin (VANCOCIN) 500 mg in sodium chloride 0.9 % 100 mL IVPB     500 mg 100 mL/hr over 60 Minutes Intravenous Every 8 hours 11/24/14 1913     11/25/14 0300  piperacillin-tazobactam (ZOSYN) IVPB 3.375 g     3.375 g 12.5 mL/hr over 240 Minutes  Intravenous Every 8 hours 11/24/14 1913     11/24/14 1915  piperacillin-tazobactam (ZOSYN) IVPB 3.375 g     3.375 g 100 mL/hr over 30 Minutes Intravenous  Once 11/24/14 1901 11/24/14 2148   11/24/14 1915  vancomycin (VANCOCIN) IVPB 1000 mg/200 mL premix     1,000 mg 200 mL/hr over 60 Minutes Intravenous  Once 11/24/14 1901 11/24/14 2219      Subjective:   Wally Teeple had multiple episodes of nausea/vomiting last night.  No diarrhea.  Headache better.  No abdominal pain.  No mouth sores or skin lesions other than a small abrasion to left ear.    Objective:    Filed Vitals:   11/25/14 0100 11/25/14 0400 11/25/14 0500 11/25/14 0700  BP:  116/54  133/92  Pulse:  131  110  Temp: 99.1 F (37.3 C)  98.9 F (37.2 C)   TempSrc: Oral  Oral   Resp:  22  22  Height:      Weight:   57.4 kg (126 lb 8.7 oz)   SpO2:  100%  100%    Intake/Output Summary (Last 24 hours) at 11/25/14 0724 Last data filed at 11/25/14 0304  Gross per 24 hour  Intake 3226.66 ml  Output   1681 ml  Net 1545.66 ml   Filed Weights   11/24/14 1107 11/24/14 1200 11/25/14 0500  Weight: 50.485 kg (111 lb 4.8 oz) 51.1 kg (112 lb 10.5 oz) 57.4 kg (126 lb 8.7 oz)    Exam: Gen:  NAD Cardiovascular:  Tachy, No M/R/G Respiratory:  Lungs CTAB Gastrointestinal:  Abdomen soft, NT/ND, + BS Extremities:  No C/E/C   Data Reviewed:    Labs: Basic Metabolic Panel:  Recent Labs Lab 11/24/14 1416 11/24/14 1801 11/24/14 2020 11/25/14 0035 11/25/14 0346 11/25/14 0624  NA 145 143 143 141 140  --   K 4.8 4.2 4.2 3.8 3.7  --   CL 115* 120* 120* 119* 117*  --   CO2 7* 7* 10* 11* 10*  --   GLUCOSE 327* 180* 176* 154* 193*  --   BUN 12 10 9 7  5*  --   CREATININE 1.10* 0.81 0.67 0.65 0.65  --   CALCIUM 8.7* 8.1* 8.0* 7.8* 7.9*  --   MG 2.0  --   --   --   --  1.5*  PHOS 3.7  --   --   --   --  1.9*   GFR Estimated Creatinine Clearance: 92.8 mL/min (by C-G formula based on Cr of 0.65). Liver Function  Tests:  Recent Labs Lab 11/24/14 0947 11/25/14 0346  AST 84* 51*  ALT 58* 36  ALKPHOS 147* 102  BILITOT 2.7* 0.8  PROT 8.2* 6.0*  ALBUMIN 4.4 3.1*    Recent Labs Lab 11/24/14 0950 11/25/14 0624  LIPASE 30 386*  AMYLASE  --  378*   Coagulation profile  Recent Labs Lab 11/24/14 1919  INR 1.31    CBC:  Recent Labs Lab 11/24/14 0947 11/24/14 0956 11/24/14 1416 11/25/14 0624  WBC  10.4  --  17.5* 10.7*  NEUTROABS 6.8  --   --   --   HGB 14.8 17.0* 13.8 13.8  HCT 44.8 50.0* 44.7 43.2  MCV 99.6  --  104.0* 100.7*  PLT 413*  --  444* 317   Cardiac Enzymes:  Recent Labs Lab 11/25/14 0624  TROPONINI <0.03   CBG:  Recent Labs Lab 11/25/14 0155 11/25/14 0302 11/25/14 0405 11/25/14 0505 11/25/14 0615  GLUCAP 135* 118* 198* 248* 160*   Sepsis Labs:  Recent Labs Lab 11/24/14 0947 11/24/14 1416 11/24/14 1801 11/24/14 2020 11/25/14 0035 11/25/14 0346 11/25/14 0624  PROCALCITON  --   --  0.21  --   --  0.15 0.17  WBC 10.4 17.5*  --   --   --   --  10.7*  LATICACIDVEN  --   --  4.7* 3.9* 4.4* 4.7*  --    Microbiology Recent Results (from the past 240 hour(s))  MRSA PCR Screening     Status: None   Collection Time: 11/24/14 12:00 PM  Result Value Ref Range Status   MRSA by PCR NEGATIVE NEGATIVE Final    Comment:        The GeneXpert MRSA Assay (FDA approved for NASAL specimens only), is one component of a comprehensive MRSA colonization surveillance program. It is not intended to diagnose MRSA infection nor to guide or monitor treatment for MRSA infections.   Culture, blood (routine x 2)     Status: None (Preliminary result)   Collection Time: 11/24/14  8:20 PM  Result Value Ref Range Status   Specimen Description BLOOD RIGHT HAND  Final   Special Requests BOTTLES DRAWN AEROBIC ONLY 10CC  Final   Culture PENDING  Incomplete   Report Status PENDING  Incomplete     Medications:   . antiseptic oral rinse  7 mL Mouth Rinse q12n4p  .  budesonide-formoterol  2 puff Inhalation BID  . chlorhexidine  15 mL Mouth Rinse BID  . enoxaparin (LOVENOX) injection  40 mg Subcutaneous Daily  . [START ON 11/26/2014] Influenza vac split quadrivalent PF  0.5 mL Intramuscular Tomorrow-1000  . magnesium sulfate 1 - 4 g bolus IVPB  2 g Intravenous Once  . montelukast  10 mg Oral Daily  . piperacillin-tazobactam (ZOSYN)  IV  3.375 g Intravenous Q8H  . potassium chloride  10 mEq Intravenous Q1 Hr x 3  . vancomycin  500 mg Intravenous Q8H   Continuous Infusions: . sodium chloride Stopped (11/24/14 1512)  . dextrose 5 % and 0.45% NaCl 125 mL/hr at 11/25/14 0541  . insulin (NOVOLIN-R) infusion 5.6 Units/hr (11/25/14 0510)  .  sodium bicarbonate  infusion 1000 mL 100 mL/hr at 11/25/14 0717    Time spent: 35 minutes.  The patient is medically complex with multiple co-morbidities and is at high risk for clinical deterioration and requires high complexity decision making.    LOS: 1 day   RAMA,CHRISTINA  [facility] Pager [phone]. If unable to reach me by pager, please call my cell phone at [phone].  *Please refer to [email or web address], password [email or web address] to get updated schedule on who will round on this patient, as hospitalists switch teams weekly. If 7PM-7AM, please contact night-coverage at [email or web address], password [email or web address] for any overnight needs.  11/25/2014, 7:24 AM

## 2014-11-25 NOTE — Progress Notes (Signed)
CRITICAL VALUE ALERT  Critical value received:  Lactic acid 7.5  Date of notification:  11/25/2014  Time of notification:  2203  Critical value read back:Yes.    Nurse who received alert:  Reche Dixon  MD notified (1st page):  L Harduk  Time of first page:  2204  MD notified (2nd page):  Time of second page:  Responding MD:  Roger Shelter  Time MD responded:  2213

## 2014-11-25 NOTE — Progress Notes (Signed)
Call to elink from PA  Concern of non-clearing lactate in setting of DKA since admit/ Total > 5L since admit 11/24/2014  9:00 AM via ER    Recent Labs Lab 11/24/14 1801 11/24/14 2020 11/25/14 0035 11/25/14 0346  LATICACIDVEN 4.7* 3.9* 4.4* 4.7*  PROCALCITON 0.21  --   --  0.15   But Creat normal  Recent Labs Lab 11/24/14 1416 11/24/14 1801 11/24/14 2020 11/25/14 0035 11/25/14 0346  CREATININE 1.10* 0.81 0.67 0.65 0.65     PLAN -2L fluid bolus and recheck lactate - check trop, amylase, CK  Dr. Brand Males, M.D., F.C.C.P Pulmonary and Critical Care Medicine Staff Physician [facility] Pager: [phone], If no answer or between  15:00h - 7:00h: call [phone]  11/25/2014 5:23 AM

## 2014-11-25 NOTE — Progress Notes (Signed)
CRITICAL VALUE ALERT  Critical value received:  K 2.3  Date of notification:  11/25/2014  Time of notification:  2222  Critical value read back:Yes.    Nurse who received alert:  Reche Dixon  MD notified (1st page):  L Harduk  Time of first page:  2223  MD notified (2nd page):  Time of second page:  Responding MD:  Roger Shelter  Time MD responded: 2230

## 2014-11-25 NOTE — Progress Notes (Signed)
Paged PA Harduk to contact elink directly for consult.

## 2014-11-25 NOTE — Progress Notes (Signed)
CRITICAL VALUE ALERT  Critical value received:Panic level ABG   Date of notification:  11/6  Time of notification:  0605  Critical value read back:Yes.    Nurse who received alert:  Jacklynn Ganong  MD notified (1st page):  Ramaswamy  Time of first page:  0606  MD notified (2nd page):  Time of second page:  Responding MD:  Chase Caller  Time MD responded:  [phone]

## 2014-11-26 ENCOUNTER — Encounter (HOSPITAL_COMMUNITY): Payer: Self-pay | Admitting: Internal Medicine

## 2014-11-26 DIAGNOSIS — K760 Fatty (change of) liver, not elsewhere classified: Secondary | ICD-10-CM

## 2014-11-26 DIAGNOSIS — E876 Hypokalemia: Secondary | ICD-10-CM

## 2014-11-26 DIAGNOSIS — R160 Hepatomegaly, not elsewhere classified: Secondary | ICD-10-CM

## 2014-11-26 DIAGNOSIS — E8770 Fluid overload, unspecified: Secondary | ICD-10-CM | POA: Diagnosis present

## 2014-11-26 DIAGNOSIS — E8779 Other fluid overload: Secondary | ICD-10-CM

## 2014-11-26 DIAGNOSIS — E0810 Diabetes mellitus due to underlying condition with ketoacidosis without coma: Secondary | ICD-10-CM

## 2014-11-26 DIAGNOSIS — K859 Acute pancreatitis without necrosis or infection, unspecified: Secondary | ICD-10-CM

## 2014-11-26 HISTORY — DX: Acute pancreatitis without necrosis or infection, unspecified: K85.90

## 2014-11-26 HISTORY — DX: Fatty (change of) liver, not elsewhere classified: K76.0

## 2014-11-26 HISTORY — DX: Hepatomegaly, not elsewhere classified: R16.0

## 2014-11-26 LAB — BASIC METABOLIC PANEL
ANION GAP: 13 (ref 5–15)
Anion gap: 13 (ref 5–15)
BUN: <5 mg/dL — ABNORMAL LOW (ref 6–20)
CHLORIDE: 104 mmol/L (ref 101–111)
CO2: 21 mmol/L — AB (ref 22–32)
CO2: 20 mmol/L — ABNORMAL LOW (ref 22–32)
Calcium: 7.3 mg/dL — ABNORMAL LOW (ref 8.9–10.3)
Calcium: 7.3 mg/dL — ABNORMAL LOW (ref 8.9–10.3)
Chloride: 106 mmol/L (ref 101–111)
Creatinine, Ser: 0.61 mg/dL (ref 0.44–1.00)
Creatinine, Ser: 0.72 mg/dL (ref 0.44–1.00)
GFR calc Af Amer: >60 mL/min (ref >60)
GFR calc non Af Amer: >60 mL/min (ref >60)
GFR calc non Af Amer: >60 mL/min (ref >60)
GLUCOSE: 181 mg/dL — AB (ref 65–99)
Glucose, Bld: 168 mg/dL — ABNORMAL HIGH (ref 65–99)
Potassium: 2.6 mmol/L — CL (ref 3.5–5.1)
Potassium: 2.8 mmol/L — ABNORMAL LOW (ref 3.5–5.1)
SODIUM: 139 mmol/L (ref 135–145)
Sodium: 138 mmol/L (ref 135–145)

## 2014-11-26 LAB — GLUCOSE, CAPILLARY
GLUCOSE-CAPILLARY: 132 mg/dL — AB (ref 65–99)
GLUCOSE-CAPILLARY: 146 mg/dL — AB (ref 65–99)
GLUCOSE-CAPILLARY: 171 mg/dL — AB (ref 65–99)
GLUCOSE-CAPILLARY: 173 mg/dL — AB (ref 65–99)
GLUCOSE-CAPILLARY: 174 mg/dL — AB (ref 65–99)
GLUCOSE-CAPILLARY: 187 mg/dL — AB (ref 65–99)
GLUCOSE-CAPILLARY: 201 mg/dL — AB (ref 65–99)
GLUCOSE-CAPILLARY: 255 mg/dL — AB (ref 65–99)
GLUCOSE-CAPILLARY: 269 mg/dL — AB (ref 65–99)
GLUCOSE-CAPILLARY: 280 mg/dL — AB (ref 65–99)
Glucose-Capillary: 129 mg/dL — ABNORMAL HIGH (ref 65–99)
Glucose-Capillary: 118 mg/dL — ABNORMAL HIGH (ref 65–99)
Glucose-Capillary: 182 mg/dL — ABNORMAL HIGH (ref 65–99)
Glucose-Capillary: 123 mg/dL — ABNORMAL HIGH (ref 65–99)
Glucose-Capillary: 98 mg/dL (ref 65–99)

## 2014-11-26 LAB — BLOOD GAS, VENOUS
ACID-BASE DEFICIT: 18.1 mmol/L — AB (ref 0.0–2.0)
BICARBONATE: 9.6 meq/L — AB (ref 20.0–24.0)
O2 SAT: 34.6 %
PATIENT TEMPERATURE: 36.8
PO2 VEN: 26.7 mmHg — AB (ref 30.0–45.0)
TCO2: 9.2 mmol/L (ref 0–100)
pCO2, Ven: 28.3 mmHg — ABNORMAL LOW (ref 45.0–50.0)
pH, Ven: 7.159 — CL (ref 7.250–7.300)

## 2014-11-26 LAB — CBC
HEMATOCRIT: 33.8 % — AB (ref 36.0–46.0)
HEMOGLOBIN: 11.4 g/dL — AB (ref 12.0–15.0)
MCH: 32.3 pg (ref 26.0–34.0)
MCHC: 33.7 g/dL (ref 30.0–36.0)
MCV: 95.8 fL (ref 78.0–100.0)
Platelets: 295 10*3/uL (ref 150–400)
RBC: 3.53 MIL/uL — AB (ref 3.87–5.11)
RDW: 13.7 % (ref 11.5–15.5)
WBC: 8.2 10*3/uL (ref 4.0–10.5)

## 2014-11-26 LAB — PROCALCITONIN: Procalcitonin: 0.15 ng/mL

## 2014-11-26 LAB — VANCOMYCIN, TROUGH: Vancomycin Tr: 5 ug/mL — ABNORMAL LOW (ref 10.0–20.0)

## 2014-11-26 LAB — HEPATIC FUNCTION PANEL
ALBUMIN: 2.6 g/dL — AB (ref 3.5–5.0)
ALK PHOS: 92 U/L (ref 38–126)
ALT: 37 U/L (ref 14–54)
AST: 70 U/L — ABNORMAL HIGH (ref 15–41)
BILIRUBIN DIRECT: 0.1 mg/dL (ref 0.1–0.5)
BILIRUBIN INDIRECT: 0.5 mg/dL (ref 0.3–0.9)
BILIRUBIN TOTAL: 0.6 mg/dL (ref 0.3–1.2)
Total Protein: 5.2 g/dL — ABNORMAL LOW (ref 6.5–8.1)

## 2014-11-26 LAB — PHOSPHORUS: Phosphorus: <1.1 mg/dL — ABNORMAL LOW (ref 2.5–4.6)

## 2014-11-26 LAB — LIPASE, BLOOD: LIPASE: 270 U/L — AB (ref 11–51)

## 2014-11-26 LAB — LACTIC ACID, PLASMA: LACTIC ACID, VENOUS: 7.3 mmol/L — AB (ref 0.5–2.0)

## 2014-11-26 LAB — MAGNESIUM: Magnesium: 1.5 mg/dL — ABNORMAL LOW (ref 1.7–2.4)

## 2014-11-26 MED ORDER — VANCOMYCIN HCL IN DEXTROSE 750-5 MG/150ML-% IV SOLN
750.0000 mg | Freq: Three times a day (TID) | INTRAVENOUS | Status: DC
  Filled 2014-11-26 (×2): qty 150

## 2014-11-26 MED ORDER — INSULIN GLARGINE 100 UNIT/ML ~~LOC~~ SOLN
40.0000 [IU] | SUBCUTANEOUS | Status: DC
  Administered 2014-11-26 – 2014-11-29 (×4): 40 [IU] via SUBCUTANEOUS
  Filled 2014-11-26 (×4): qty 0.4

## 2014-11-26 MED ORDER — INSULIN GLARGINE 100 UNIT/ML ~~LOC~~ SOLN: 28.0000 [IU] | SUBCUTANEOUS | Status: DC

## 2014-11-26 MED ORDER — POTASSIUM PHOSPHATES 15 MMOLE/5ML IV SOLN
20.0000 mmol | Freq: Once | INTRAVENOUS | Status: AC
  Administered 2014-11-26: 20 mmol via INTRAVENOUS
  Filled 2014-11-26: qty 6.67

## 2014-11-26 MED ORDER — PROMETHAZINE HCL 25 MG/ML IJ SOLN
25.0000 mg | Freq: Once | INTRAMUSCULAR | Status: AC
  Administered 2014-11-26: 25 mg via INTRAVENOUS
  Filled 2014-11-26: qty 1

## 2014-11-26 MED ORDER — POTASSIUM CHLORIDE 10 MEQ/100ML IV SOLN
10.0000 meq | INTRAVENOUS | Status: AC
  Administered 2014-11-26 (×4): 10 meq via INTRAVENOUS
  Filled 2014-11-26 (×2): qty 100

## 2014-11-26 MED ORDER — KCL IN DEXTROSE-NACL 40-5-0.9 MEQ/L-%-% IV SOLN
INTRAVENOUS | Status: DC
  Administered 2014-11-26 – 2014-11-28 (×4): via INTRAVENOUS
  Filled 2014-11-26 (×6): qty 1000

## 2014-11-26 MED ORDER — FUROSEMIDE 10 MG/ML IJ SOLN
20.0000 mg | Freq: Once | INTRAMUSCULAR | Status: AC
  Administered 2014-11-26: 20 mg via INTRAVENOUS
  Filled 2014-11-26: qty 2

## 2014-11-26 MED ORDER — INSULIN ASPART 100 UNIT/ML ~~LOC~~ SOLN
0.0000 [IU] | SUBCUTANEOUS | Status: DC
  Administered 2014-11-26: 8 [IU] via SUBCUTANEOUS
  Administered 2014-11-26: 2 [IU] via SUBCUTANEOUS
  Administered 2014-11-27 (×2): 3 [IU] via SUBCUTANEOUS
  Administered 2014-11-28: 2 [IU] via SUBCUTANEOUS
  Administered 2014-11-28 – 2014-11-29 (×4): 3 [IU] via SUBCUTANEOUS
  Administered 2014-11-29: 2 [IU] via SUBCUTANEOUS

## 2014-11-26 MED ORDER — MAGNESIUM SULFATE 2 GM/50ML IV SOLN
2.0000 g | Freq: Once | INTRAVENOUS | Status: AC
  Administered 2014-11-26: 2 g via INTRAVENOUS
  Filled 2014-11-26: qty 50

## 2014-11-26 MED ORDER — INSULIN GLARGINE 100 UNIT/ML ~~LOC~~ SOLN: 40.0000 [IU] | SUBCUTANEOUS | Status: DC

## 2014-11-26 NOTE — Progress Notes (Signed)
CRITICAL VALUE ALERT  Critical value received:  Phosphorus < 1.1  Date of notification:  11/26/2014  Time of notification:  0558  Critical value read back:Yes.    Nurse who received alert:  Reche Dixon  MD notified (1st page):  L Harduk  Time of first page:  0559  MD notified (2nd page):  Time of second page:  Responding MD:  Roger Shelter  Time MD responded:  [phone]

## 2014-11-26 NOTE — Progress Notes (Signed)
Insulin drip stopped per GlucoStabilizer DKA protocol. MD Rama notified. Orders received.

## 2014-11-26 NOTE — Progress Notes (Signed)
Date: November 26, 2014 Chart reviewed for concurrent status and case management needs. Will continue to follow patient for changes and needs: Iv insulin drip and Sodium bicar drip stopped am of [id number].  D5w and ns at 125cc/hr Velva Harman, RN, BSN, [place]   [phone]

## 2014-11-26 NOTE — Progress Notes (Addendum)
ANTIBIOTIC CONSULT NOTE - INITIAL  Pharmacy Consult for vancomycin and zosyn Indication: rule out sepsis  Allergies  Allergen Reactions  . Peanut-Containing Drug Products Swelling    Swelling of mouth, lips  . Strawberry Extract Swelling    Swelling of mouth, lips   Patient Measurements: Height: 5\' 4"  (162.6 cm) Weight: 126 lb 8.7 oz (57.4 kg) IBW/kg (Calculated) : 54.7  Vital Signs: Temp: 99 F (37.2 C) (11/07 1200) Temp Source: Oral (11/07 1200) BP: 143/109 mmHg (11/07 1300) Pulse Rate: 106 (11/07 1300) Intake/Output from previous day: 11/06 0701 - 11/07 0700 In: 7505 [I.V.:5198.4; IV Piggyback:2306.7] Out: 7125 [Urine:7125] Intake/Output from this shift: Total I/O In: 605.8 [I.V.:205.8; IV Piggyback:400] Out: 300 [Urine:300]  Labs:  Recent Labs  11/25/14 0624  11/25/14 2116 11/26/14 0126 11/26/14 0512 11/26/14 1305  WBC 10.7*  --  7.6  --  8.2  --   HGB 13.8  --  11.2*  --  11.4*  --   PLT 317  --  281  --  295  --   CREATININE  --   < > 0.62 0.61  --  0.72  < > = values in this interval not displayed. Estimated Creatinine Clearance: 92.8 mL/min (by C-G formula based on Cr of 0.72).  Recent Labs  11/26/14 1305  [place]oz[place] 5*    Microbiology: Recent Results (from the past 720 hour(s))  MRSA PCR Screening     Status: None   Collection Time: 11/24/14 12:00 PM  Result Value Ref Range Status   MRSA by PCR NEGATIVE NEGATIVE Final    Comment:        The GeneXpert MRSA Assay (FDA approved for NASAL specimens only), is one component of a comprehensive MRSA colonization surveillance program. It is not intended to diagnose MRSA infection nor to guide or monitor treatment for MRSA infections.   Culture, blood (routine x 2)     Status: None (Preliminary result)   Collection Time: 11/24/14  8:15 PM  Result Value Ref Range Status   Specimen Description BLOOD LEFT ARM  Final   Special Requests   Final    BOTTLES DRAWN AEROBIC AND ANAEROBIC [place] BOTH  BOTTLES   Culture   Final    NO GROWTH 1 DAY Performed at [facility]    Report Status PENDING  Incomplete  Culture, blood (routine x 2)     Status: None (Preliminary result)   Collection Time: 11/24/14  8:20 PM  Result Value Ref Range Status   Specimen Description BLOOD RIGHT HAND  Final   Special Requests BOTTLES DRAWN AEROBIC ONLY 10CC  Final   Culture   Final    NO GROWTH 1 DAY Performed at [facility]    Report Status PENDING  Incomplete   Medical History: Past Medical History  Diagnosis Date  . Diabetes mellitus   . Asthma   . Hypertension   . Headache(784.0)   . Pancreatitis, acute 11/26/2014  . Hepatic steatosis 11/26/2014  . Liver mass 11/26/2014   Assessment: Patient's a 24 y.o presented to the ED on 11/5 with c/o n/v and hematemesis.  She was subsequently found to have elevated cbgs and in DKA. LA continues elevated-- started broad spectrum abx for suspected sepsis.  ProCalcitonin levels unremarkable, Lactic acid continues elevated  Tmax 99, WBC wnl, SCr wnl  No growth blood cx from 11/5  Vancomycin trough 26mcg/ml on 500mg  IV q8hr   Goal of Therapy:  Vancomycin trough level 15-20 mcg/ml  Plan:  Continue Zosyn 3.375gm q8, 4 hr infusion  Increase Vancomycin to 750mg  q8hr  Minda Ditto PharmD Pager [phone] 11/26/2014, 2:13 PM   Addendum:  Discontinue Vancomycin, continue Marinda Elk PharmD Pager [phone] 11/26/2014, 2:17 PM

## 2014-11-26 NOTE — Progress Notes (Signed)
CRITICAL VALUE ALERT  Critical value received:  K 2.6  Date of notification:  11/26/2014  Time of notification:  0200  Critical value read back:Yes.    Nurse who received alert:  Reche Dixon  MD notified (1st page):  L Harduk  Time of first page:  0202  MD notified (2nd page):  Time of second page:  Responding MD:  L Harduk  Time MD responded:  0600

## 2014-11-26 NOTE — Progress Notes (Signed)
Called on-call [facility] NP to report that patient meets the criteria to move to the next phase of the DKA protocol; NP ordered RN to continue the current DKA phase since the patient is not eating; Patient remains on insulin drip and D5 - 0.45% NS.

## 2014-11-26 NOTE — Progress Notes (Signed)
Inpatient Diabetes Program Recommendations  AACE/ADA: New Consensus Statement on Inpatient Glycemic Control (2015)  Target Ranges:  Prepandial:   less than 140 mg/dL      Peak postprandial:   less than 180 mg/dL (1-2 hours)      Critically ill patients:  140 - 180 mg/dL    Admit with: Migraines and DKA  History: Type 1 DM, HTN  Home DM Meds: Lantus 36 units QHS       Novolog QID per SSI regimen  Current Insulin Orders: IV Insulin drip per DKA Protocol orders    -Note patient admitted with Migraines and DKA.  Initial glucose 525 mg/dl, CO2 of 9, and Anion Gap 26.  Treated with IVF boluses and started on IV Insulin drip at 11:30 am on 11/24/14.  -Note that patient's Lactic Acid level has been elevated.  Patient requiring potassium supplementation as well.  -BMET from 1:26am today showed glucose of 168 mg/dl, CO2 of 21, and Anion Gap 13.  -Through Care Everywhere/Chart Review, discovered that patient was recently admitted for DKA back in August.  This DM Coordinator spoke with patient during that visit regarding her DM care regimen at home.  Patient stated she was seeing Dr. Nonda Lou with [facility] practice on [address] in [place].  During that admission, patient stated she was taking Lantus 36 units QHS along with Novolog SSI and Novolog meal coverage (was taking 1 unit Novolog for every 10 grams of carbohydrates).  Note through further investigation that patient went to see Dr. Ouida Sills on 09/28/14.  Patient did not bring her blood glucose meter or her blood glucose logbook to that visit.  Dr. Ouida Sills stated in her notes that it was difficult to help patient titrate her insulins since she never brings her CBG readings to any of her MD visits.  Dr. Ouida Sills referred patient to an Endocrinologist, however, I am unsure if she has seen the Endocrinologist yet.     MD- When patient is stable and ready to transition off the IV insulin drip, recommend  starting at least 80% of her home dose of Lantus (Lantus 28 units).  Give Lantus and continue IV insulin drip at least 1 hour after Lantus given.  Then can start Novolog Sensitive SSI (can order Q4 hours if patient remains NPO).  Once patient starts eating, she will need Novolog Meal Coverage as well.  Per notes, patient usually doses her carbohydrate coverage as 1 unit for every 10 grams of carbohydrates.      --Will follow patient during hospitalization--  Wyn Quaker RN, MSN, CDE Diabetes Coordinator Inpatient Glycemic Control Team Team Pager: [phone] (8a-5p)

## 2014-11-26 NOTE — Progress Notes (Addendum)
Progress Note   Vernita Tague ZGY:[id number] DOB: 1990-06-01 DOA: 11/24/2014 PCP: Coletta Memos, PA-C   Brief Narrative:   [name] is an 24 y.o. female with a PMH of type 1 diabetes, hypertension, asthma, and migraine headaches who was admitted 11/24/14 with a 2 week history of migraine headaches and DKA. Upon initial workup, the patient was noted to have an elevated lactic acid worrisome for sepsis of unclear etiology.  Assessment/Plan:   Principal Problem:  DKA, type 1 ([place]) in a patient with uncontrolled type 1 diabetes mellitus ([place]) / sepsis - Status post aggressive volume replacement. - Anion gap 13, bicarbonate 21. IV fluids containing bicarbonate discontinued. - Continue aggressive electrolyte replacement. - Felt to be septic due to elevated lactic acid, sepsis workup initiated. - Started on empiric vancomycin and Zosyn 11/24/14. Follow-up blood cultures. Pro calcitonin reassuring. - Add dextrose to IV fluids when serum glucose less than 250. - Transition to basal/bolus insulin now that ketoacidosis has resolved, CBGs 132-280..  - Continue IV insulin infusion for one to two hours after initiating the SQ insulin, to avoid recurrent hyperglycemia. - DM coordinator & dietician consultations.  Active Problems:   Volume overload in the setting of aggressive fluid volume resuscitation  - Given Lasix last night. IV fluids reduced.    Acute pancreatitis with elevated lipase and amylase - Abdominal ultrasound unrevealing. - CT of the abdomen and pelvis done 11/25/14: Volume overload, diffuse retroperitoneal edema, pancreatitis. - Repeat pancreatic enzymes shows persistent elevation. - Continue bowel rest.    Sinus tachycardia - Secondary sepsis. Monitor on telemetry. Troponin not elevated.    Hypomagnesemia/hypophosphatemia/hypokalemia - Monitor and replace electrolytes as needed.   Asthma - Continue Symbicort, Singulair and bronchodilators as needed.    Elevated LFTs/hepatic steatosis/liver mass - Monitor. Abdominal ultrasound shows hepatic steatosis. LFTs have improved with hydration. - Tylenol level not elevated. - Liver mass noted on CT abdomen, recommend follow-up MRI when stable.   Acute kidney injury ([place]) - Secondary to osmotic diuresis. Creatinine normalized with IV fluids.   Migraine headache - Toradol ordered.    DVT prophylaxis - Lovenox ordered.   Family Communication/Anticipated D/C date and plan/Code Status   Family Communication: Mother updated at the bedside 11/25/14. Disposition Plan: Home when DKA resolves and sepsis/pancreatitis treated. Anticipated D/C date:   2-3 days. Code Status:     Code Status Orders        Start     Ordered   11/24/14 1308  Full code   Continuous     11/24/14 1309       IV Access:    Peripheral IV   Procedures and diagnostic studies:   US Abdomen Complete  11/25/2014  CLINICAL DATA:  History of diabetic ketoacidosis and acute renal failure. EXAM: ULTRASOUND ABDOMEN COMPLETE COMPARISON:  Right upper quadrant abdominal ultrasound - 09/21/2014; abdominal ultrasound - 02/27/2014; abdominal radiograph -11/25/2014 FINDINGS: Gallbladder: Sonographically normal. No echogenic gallstones or gall sludge. No gallbladder wall thickening or pericholecystic fluid. Negative sonographic Murphy's sign. Common bile duct:  Normal in size measuring 2.7 mm in diameter Liver: There is mild diffuse increased echogenicity of the hepatic parenchyma. There is an approximately 2.4 x 3.1 x 2.5 cm nearly isoechoic ill-defined nodule within the right lobe of the liver (image 29) as well as an additional isoechoic approximately 2.0 x 1.4 x 2.3 cm nodule within the central aspect of the right lobe of the liver (image 33), both of which appear unchanged since the 09/2014 examination. No  new discrete hepatic lesions. No intrahepatic biliary duct dilatation. No ascites. IVC: No abnormality visualized. Pancreas:  Limited visualization of the pancreatic head and neck is normal. Visualization of the pancreatic body and tail is obscured by bowel gas. Spleen: Normal in size measuring 5.3 cm in length Right Kidney: Normal cortical thickness, echogenicity and size, measuring 12.5 cm in length. No focal renal lesions. No echogenic renal stones. No urinary obstruction. Left Kidney: Normal cortical thickness, echogenicity and size, measuring 11.7 cm in length. No focal renal lesions. No echogenic renal stones. No urinary obstruction. Abdominal aorta: No aneurysm visualized. Other findings: None. IMPRESSION: 1. No definite explanation for patient's acute renal failure. Specifically, no evidence of urinary obstruction. 2. Similar findings of suspected hepatic steatosis. 3. Ill-defined nearly isoechoic nodules within the right lobe of the liver appear grossly unchanged since the 09/2014 examination and while technically indeterminate, in the absence of a known primary malignancy, these nodules are favored to be benign in etiology potentially representative of hemangiomas or FNH. Further evaluation with nonemergent abdominal MRI could be performed as clinically indicated. Electronically Signed   By: Sandi Mariscal M.D.   On: 11/25/2014 09:05   Ct Abdomen Pelvis W Contrast  11/25/2014  CLINICAL DATA:  Abdominal pain and abnormal liver function tests after recent DKA. EXAM: CT ABDOMEN AND PELVIS WITH CONTRAST TECHNIQUE: Multidetector CT imaging of the abdomen and pelvis was performed using the standard protocol following bolus administration of intravenous contrast. CONTRAST:  158mL OMNIPAQUE IOHEXOL 300 MG/ML  SOLN COMPARISON:  None. FINDINGS: Lower chest and abdominal wall: Small right pleural effusion which is layering. Lung opacity at the right base has a linear morphology favoring atelectasis. No definitive pneumonia. Hepatobiliary: Large (22 cm craniocaudal span) and steatotic appearing liver. There is a lobulated mass in segment 5  measuring up to 27 mm, size stable compared to 09/21/2014 sonography. Other masses on prior sonography are not visualized on this single phase study. Liver lesions on previous ultrasound had a indeterminate appearance with hypoechoic halos. Cholelithiasis. No evidence of acute cholecystitis. Pancreas: Diffuse retroperitoneal edema without notable pancreas expansion. No ductal enlargement. Spleen: Unremarkable. Adrenals/Urinary Tract: Negative adrenals. No hydronephrosis or stone. Dilated bladder without wall thickening. Reproductive:No pathologic findings. Stomach/Bowel:  No obstruction. Vascular/Lymphatic: No acute vascular abnormality. No mass or adenopathy. Peritoneal: No ascites or pneumoperitoneum. Musculoskeletal: No acute abnormalities. IMPRESSION: 1. Retroperitoneal edema partially attributed to pancreatitis given serum enzymes. 2. Volume overload with small right pleural effusion, ascites, and retroperitoneal edema. 3. 27 mm segment 5 liver mass. Additional masses seen by sonography 09/21/2014 are not visualized today. When the patient is clinically stable and able to follow directions and hold their breath (preferably as an outpatient) further evaluation with liver MRI recommended for definitive characterization. 4. Hepatomegaly and steatosis. 5. Cholelithiasis. Electronically Signed   By: Monte Fantasia M.D.   On: 11/25/2014 23:41   Dg Chest Portable 1 View  11/24/2014  CLINICAL DATA:  Shortness of breath.  Pain EXAM: PORTABLE CHEST 1 VIEW COMPARISON:  09/18/2014 FINDINGS: The heart size and mediastinal contours are within normal limits. Both lungs are clear. The visualized skeletal structures are unremarkable. IMPRESSION: No active disease. Electronically Signed   By: Kerby Moors M.D.   On: 11/24/2014 10:11   Dg Abd Portable 1v  11/25/2014  CLINICAL DATA:  Generalized abdominal pain EXAM: PORTABLE ABDOMEN - 1 VIEW COMPARISON:  None. FINDINGS: The bowel gas pattern is normal. No radio-opaque  calculi or other significant radiographic abnormality are seen. IMPRESSION: Negative. Electronically  Signed   By: Andreas Newport M.D.   On: 11/25/2014 05:44      Medical Consultants:    PCCM  Anti-Infectives:   Anti-infectives    Start     Dose/Rate Route Frequency Ordered Stop   11/25/14 0500  vancomycin (VANCOCIN) 500 mg in sodium chloride 0.9 % 100 mL IVPB     500 mg 100 mL/hr over 60 Minutes Intravenous Every 8 hours 11/24/14 1913     11/25/14 0300  piperacillin-tazobactam (ZOSYN) IVPB 3.375 g     3.375 g 12.5 mL/hr over 240 Minutes Intravenous Every 8 hours 11/24/14 1913     11/24/14 1915  piperacillin-tazobactam (ZOSYN) IVPB 3.375 g     3.375 g 100 mL/hr over 30 Minutes Intravenous  Once 11/24/14 1901 11/24/14 2148   11/24/14 1915  vancomycin (VANCOCIN) IVPB 1000 mg/200 mL premix     1,000 mg 200 mL/hr over 60 Minutes Intravenous  Once 11/24/14 1901 11/24/14 2219      Subjective:   Katiana Brandt continues to have nausea and dry heaves.  No dyspnea/cough.  No diarrhea.  Has periumbilical abdominal pain, rated 6/10.  No further headaches.      Objective:    Filed Vitals:   11/25/14 1700 11/25/14 2003 11/26/14 0006 11/26/14 0400  BP:  124/82 140/94 130/82  Pulse:  106 103 110  Temp: 99 F (37.2 C) 99.2 F (37.3 C) 99.2 F (37.3 C) 99 F (37.2 C)  TempSrc: Oral Oral Oral Axillary  Resp:  21 24 19   Height:      Weight:      SpO2:  100% 97% 100%    Intake/Output Summary (Last 24 hours) at 11/26/14 0715 Last data filed at 11/26/14 7412  Gross per 24 hour  Intake 7413.36 ml  Output   7125 ml  Net 288.36 ml   Filed Weights   11/24/14 1107 11/24/14 1200 11/25/14 0500  Weight: 50.485 kg (111 lb 4.8 oz) 51.1 kg (112 lb 10.5 oz) 57.4 kg (126 lb 8.7 oz)    Exam: Gen:  NAD, lethargic Cardiovascular:  Tachy, No M/R/G Respiratory:  Lungs CTAB Gastrointestinal:  Abdomen soft, NT/ND, + BS Extremities:  1+ edema   Data Reviewed:    Labs: Basic  Metabolic Panel:  Recent Labs Lab 11/24/14 1416  11/25/14 0624 11/25/14 0937 11/25/14 1330 11/25/14 1715 11/25/14 2116 11/26/14 0126 11/26/14 0512  NA 145  < >  --  139 136 140 139 138  --   K 4.8  < >  --  3.6 3.4* 3.3* 2.3* 2.6*  --   CL 115*  < >  --  111 107 111 108 104  --   CO2 7*  < >  --  11* 10* 14* 20* 21*  --   GLUCOSE 327*  < >  --  244* 284* 137* 144* 168*  --   BUN 12  < >  --  <5* <5* <5* <5* <5*  --   CREATININE 1.10*  < >  --  0.60 0.60 0.59 0.62 0.61  --   CALCIUM 8.7*  < >  --  8.0* 7.9* 7.2* 7.2* 7.3*  --   MG 2.0  --  1.5*  --   --   --   --   --  1.5*  PHOS 3.7  --  1.9*  --   --   --   --   --  <1.1*  < > = values in this interval not  displayed. GFR Estimated Creatinine Clearance: 92.8 mL/min (by C-G formula based on Cr of 0.61). Liver Function Tests:  Recent Labs Lab 11/24/14 0947 11/25/14 0346 11/26/14 0512  AST 84* 51* 70*  ALT 58* 36 37  ALKPHOS 147* 102 92  BILITOT 2.7* 0.8 0.6  PROT 8.2* 6.0* 5.2*  ALBUMIN 4.4 3.1* 2.6*    Recent Labs Lab 11/24/14 0950 11/25/14 0624 11/25/14 2116 11/26/14 0512  LIPASE 30 386* 343* 270*  AMYLASE  --  378* 392*  --    Coagulation profile  Recent Labs Lab 11/24/14 1919  INR 1.31    CBC:  Recent Labs Lab 11/24/14 0947 11/24/14 0956 11/24/14 1416 11/25/14 0624 11/25/14 2116 11/26/14 0512  WBC 10.4  --  17.5* 10.7* 7.6 8.2  NEUTROABS 6.8  --   --   --  5.5  --   HGB 14.8 17.0* 13.8 13.8 11.2* 11.4*  HCT 44.8 50.0* 44.7 43.2 33.6* 33.8*  MCV 99.6  --  104.0* 100.7* 94.6 95.8  PLT 413*  --  444* 317 281 295   Cardiac Enzymes:  Recent Labs Lab 11/25/14 0624 11/25/14 1330 11/25/14 2116  CKTOTAL 41  --   --   CKMB 5.1*  --   --   TROPONINI <0.03 <0.03 <0.03   CBG:  Recent Labs Lab 11/26/14 0043 11/26/14 0128 11/26/14 0239 11/26/14 0356 11/26/14 0639  GLUCAP 132* 146* 280* 201* 173*   Sepsis Labs:  Recent Labs Lab 11/24/14 1416 11/24/14 1801  11/25/14 0346  11/25/14 0624 11/25/14 0937 11/25/14 1330 11/25/14 2116 11/26/14 0126 11/26/14 0512  PROCALCITON  --  0.21  --  0.15 0.17  --   --   --   --  0.15  WBC 17.5*  --   --   --  10.7*  --   --  7.6  --  8.2  LATICACIDVEN  --  4.7*  < > 4.7*  --  4.0* 5.6* 7.5* 7.3*  --   < > = values in this interval not displayed. Microbiology Recent Results (from the past 240 hour(s))  MRSA PCR Screening     Status: None   Collection Time: 11/24/14 12:00 PM  Result Value Ref Range Status   MRSA by PCR NEGATIVE NEGATIVE Final    Comment:        The GeneXpert MRSA Assay (FDA approved for NASAL specimens only), is one component of a comprehensive MRSA colonization surveillance program. It is not intended to diagnose MRSA infection nor to guide or monitor treatment for MRSA infections.   Culture, blood (routine x 2)     Status: None (Preliminary result)   Collection Time: 11/24/14  8:20 PM  Result Value Ref Range Status   Specimen Description BLOOD RIGHT HAND  Final   Special Requests BOTTLES DRAWN AEROBIC ONLY 10CC  Final   Culture PENDING  Incomplete   Report Status PENDING  Incomplete     Medications:   . antiseptic oral rinse  7 mL Mouth Rinse q12n4p  . budesonide-formoterol  2 puff Inhalation BID  . chlorhexidine  15 mL Mouth Rinse BID  . enoxaparin (LOVENOX) injection  40 mg Subcutaneous Daily  . Influenza vac split quadrivalent PF  0.5 mL Intramuscular Tomorrow-1000  . metoCLOPramide (REGLAN) injection  5 mg Intravenous 4 times per day  . montelukast  10 mg Oral Daily  . piperacillin-tazobactam (ZOSYN)  IV  3.375 g Intravenous Q8H  . vancomycin  500 mg Intravenous Q8H   Continuous Infusions: .  dextrose 5 % and 0.45% NaCl 125 mL/hr at 11/26/14 0608  . insulin (NOVOLIN-R) infusion 2.3 Units/hr (11/26/14 0644)    Time spent: 35 minutes.  The patient is medically complex with multiple co-morbidities and is at high risk for clinical deterioration and requires high complexity decision  making.    LOS: 2 days   Morris Longenecker  [facility] Pager [phone]. If unable to reach me by pager, please call my cell phone at [phone].  *Please refer to [email or web address], password [email or web address] to get updated schedule on who will round on this patient, as hospitalists switch teams weekly. If 7PM-7AM, please contact night-coverage at [email or web address], password [email or web address] for any overnight needs.  11/26/2014, 7:15 AM

## 2014-11-26 NOTE — Progress Notes (Signed)
CRITICAL VALUE ALERT  Critical value received:  Lactic acid 7.3  Date of notification:  11/26/2014  Time of notification:  0208  Critical value read back:Yes.    Nurse who received alert:  Reche Dixon  MD notified (1st page):  [place]  Time of first page:  0210  MD notified (2nd page):  Time of second page:  Responding MD:  Deterding  Time MD responded:  [phone]

## 2014-11-27 ENCOUNTER — Inpatient Hospital Stay (HOSPITAL_COMMUNITY): Payer: BLUE CROSS/BLUE SHIELD

## 2014-11-27 DIAGNOSIS — R1110 Vomiting, unspecified: Secondary | ICD-10-CM

## 2014-11-27 DIAGNOSIS — R112 Nausea with vomiting, unspecified: Secondary | ICD-10-CM | POA: Diagnosis present

## 2014-11-27 LAB — GLUCOSE, CAPILLARY
GLUCOSE-CAPILLARY: 101 mg/dL — AB (ref 65–99)
GLUCOSE-CAPILLARY: 157 mg/dL — AB (ref 65–99)
GLUCOSE-CAPILLARY: 119 mg/dL — AB (ref 65–99)
GLUCOSE-CAPILLARY: 118 mg/dL — AB (ref 65–99)
Glucose-Capillary: 192 mg/dL — ABNORMAL HIGH (ref 65–99)
Glucose-Capillary: 140 mg/dL — ABNORMAL HIGH (ref 65–99)

## 2014-11-27 LAB — BASIC METABOLIC PANEL
ANION GAP: 7 (ref 5–15)
BUN: <5 mg/dL — ABNORMAL LOW (ref 6–20)
CO2: 25 mmol/L (ref 22–32)
CREATININE: 0.45 mg/dL (ref 0.44–1.00)
Calcium: 7.6 mg/dL — ABNORMAL LOW (ref 8.9–10.3)
Chloride: 109 mmol/L (ref 101–111)
GFR calc non Af Amer: >60 mL/min (ref >60)
Glucose, Bld: 113 mg/dL — ABNORMAL HIGH (ref 65–99)
Potassium: 3.7 mmol/L (ref 3.5–5.1)
SODIUM: 141 mmol/L (ref 135–145)

## 2014-11-27 LAB — PHOSPHORUS: PHOSPHORUS: 1.8 mg/dL — AB (ref 2.5–4.6)

## 2014-11-27 LAB — LACTIC ACID, PLASMA: LACTIC ACID, VENOUS: 4.6 mmol/L — AB (ref 0.5–2.0)

## 2014-11-27 LAB — MAGNESIUM: MAGNESIUM: 1.9 mg/dL (ref 1.7–2.4)

## 2014-11-27 LAB — PROCALCITONIN: PROCALCITONIN: 0.18 ng/mL

## 2014-11-27 MED ORDER — MORPHINE SULFATE (PF) 2 MG/ML IV SOLN
2.0000 mg | Freq: Once | INTRAVENOUS | Status: AC
Start: 2014-11-27 — End: 2014-11-27
  Administered 2014-11-27: 2 mg via INTRAVENOUS
  Filled 2014-11-27: qty 1

## 2014-11-27 MED ORDER — POTASSIUM PHOSPHATES 15 MMOLE/5ML IV SOLN
20.0000 mmol | Freq: Once | INTRAVENOUS | Status: AC
  Administered 2014-11-27: 20 mmol via INTRAVENOUS
  Filled 2014-11-27: qty 6.67

## 2014-11-27 MED ORDER — PROMETHAZINE HCL 25 MG/ML IJ SOLN
12.5000 mg | INTRAMUSCULAR | Status: DC | PRN
  Administered 2014-11-27 – 2014-11-28 (×3): 12.5 mg via INTRAVENOUS
  Filled 2014-11-27 (×4): qty 1

## 2014-11-27 MED ORDER — MORPHINE SULFATE (PF) 2 MG/ML IV SOLN
1.0000 mg | INTRAVENOUS | Status: AC | PRN
  Administered 2014-11-28 (×2): 1 mg via INTRAVENOUS
  Filled 2014-11-27 (×2): qty 1

## 2014-11-27 MED ORDER — SODIUM CHLORIDE 0.9 % IV SOLN
INTRAVENOUS | Status: DC
  Administered 2014-11-27 – 2014-11-28 (×3): via INTRAVENOUS

## 2014-11-27 MED ORDER — PROMETHAZINE HCL 25 MG/ML IJ SOLN
25.0000 mg | Freq: Once | INTRAMUSCULAR | Status: AC
  Administered 2014-11-27: 25 mg via INTRAVENOUS

## 2014-11-27 MED ORDER — ONDANSETRON HCL 4 MG/2ML IJ SOLN
4.0000 mg | INTRAMUSCULAR | Status: DC | PRN
  Administered 2014-11-27 – 2014-11-28 (×4): 4 mg via INTRAVENOUS
  Filled 2014-11-27 (×4): qty 2

## 2014-11-27 MED ORDER — GADOBENATE DIMEGLUMINE 529 MG/ML IV SOLN
15.0000 mL | Freq: Once | INTRAVENOUS | Status: AC | PRN
  Administered 2014-11-27: 11 mL via INTRAVENOUS

## 2014-11-27 MED ORDER — HYDRALAZINE HCL 20 MG/ML IJ SOLN
5.0000 mg | Freq: Four times a day (QID) | INTRAMUSCULAR | Status: DC | PRN
  Administered 2014-11-27 – 2014-11-28 (×2): 5 mg via INTRAVENOUS
  Filled 2014-11-27 (×2): qty 1

## 2014-11-27 NOTE — Progress Notes (Signed)
Progress Note   [name] HLK:[id number] DOB: 02-Jan-1991 DOA: 11/24/2014 PCP: Coletta Memos, PA-C   Brief Narrative:   [name] is an 24 y.o. female with a PMH of type 1 diabetes, hypertension, asthma, and migraine headaches who was admitted 11/24/14 with a 2 week history of migraine headaches and DKA. Upon initial workup, the patient was noted to have an elevated lactic acid worrisome for sepsis of unclear etiology.  Assessment/Plan:   Principal Problem:  DKA, type 1 ([place]) in a patient with uncontrolled type 1 diabetes mellitus ([place]) / sepsis - Status post aggressive volume replacement. Anion gap/acidosis resolved 11/26/14. - Continue aggressive electrolyte replacement as needed. - Felt to be septic due to elevated lactic acid, sepsis workup initiated.  - Blood cultures negative to date. Pro calcitonin reassuring. Lactate beginning to clear. - Started on empiric vancomycin and Zosyn 11/24/14. Vancomycin discontinued 11/26/14. - Transitioned to basal/bolus insulin 11/26/14 CBGs 98-255. - DM coordinator & dietician consultations performed.  Active Problems:   Volume overload in the setting of aggressive fluid volume resuscitation  - Given Lasix 11/25/14. Respiratory status stable.    Acute pancreatitis with elevated lipase and amylase, intractable nausea and vomiting - Abdominal ultrasound unrevealing. - CT of the abdomen and pelvis done 11/25/14: Volume overload, diffuse retroperitoneal edema, pancreatitis. - Repeat pancreatic enzymes shows persistent elevation. - GI consultation requested. - Try sips clear liquids.    Sinus tachycardia - Secondary sepsis. Monitor on telemetry. Troponin not elevated.    Hypomagnesemia/hypophosphatemia/hypokalemia - Monitor and replace electrolytes as needed.   Asthma - Continue Symbicort, Singulair and bronchodilators as needed.   Elevated LFTs/hepatic steatosis/liver mass - Monitor. Abdominal ultrasound shows hepatic  steatosis. LFTs have improved with hydration. - Tylenol level not elevated. - Liver mass noted on CT abdomen, recommend follow-up MRI when stable.   Acute kidney injury ([place]) - Secondary to osmotic diuresis. Creatinine normalized with IV fluids.   Migraine headache - Toradol ordered as needed.    DVT prophylaxis - Lovenox ordered.   Family Communication/Anticipated D/C date and plan/Code Status   Family Communication: Mother updated at the bedside 11/25/14. Disposition Plan: Home when DKA resolves and sepsis/pancreatitis treated. Anticipated D/C date:   2-3 days. Code Status:     Code Status Orders        Start     Ordered   11/24/14 1308  Full code   Continuous     11/24/14 1309       IV Access:    Peripheral IV   Procedures and diagnostic studies:   US Abdomen Complete  11/25/2014  CLINICAL DATA:  History of diabetic ketoacidosis and acute renal failure. EXAM: ULTRASOUND ABDOMEN COMPLETE COMPARISON:  Right upper quadrant abdominal ultrasound - 09/21/2014; abdominal ultrasound - 02/27/2014; abdominal radiograph -11/25/2014 FINDINGS: Gallbladder: Sonographically normal. No echogenic gallstones or gall sludge. No gallbladder wall thickening or pericholecystic fluid. Negative sonographic Murphy's sign. Common bile duct:  Normal in size measuring 2.7 mm in diameter Liver: There is mild diffuse increased echogenicity of the hepatic parenchyma. There is an approximately 2.4 x 3.1 x 2.5 cm nearly isoechoic ill-defined nodule within the right lobe of the liver (image 29) as well as an additional isoechoic approximately 2.0 x 1.4 x 2.3 cm nodule within the central aspect of the right lobe of the liver (image 33), both of which appear unchanged since the 09/2014 examination. No new discrete hepatic lesions. No intrahepatic biliary duct dilatation. No ascites. IVC: No abnormality visualized. Pancreas: Limited visualization of  the pancreatic head and neck is normal. Visualization of  the pancreatic body and tail is obscured by bowel gas. Spleen: Normal in size measuring 5.3 cm in length Right Kidney: Normal cortical thickness, echogenicity and size, measuring 12.5 cm in length. No focal renal lesions. No echogenic renal stones. No urinary obstruction. Left Kidney: Normal cortical thickness, echogenicity and size, measuring 11.7 cm in length. No focal renal lesions. No echogenic renal stones. No urinary obstruction. Abdominal aorta: No aneurysm visualized. Other findings: None. IMPRESSION: 1. No definite explanation for patient's acute renal failure. Specifically, no evidence of urinary obstruction. 2. Similar findings of suspected hepatic steatosis. 3. Ill-defined nearly isoechoic nodules within the right lobe of the liver appear grossly unchanged since the 09/2014 examination and while technically indeterminate, in the absence of a known primary malignancy, these nodules are favored to be benign in etiology potentially representative of hemangiomas or FNH. Further evaluation with nonemergent abdominal MRI could be performed as clinically indicated. Electronically Signed   By: Sandi Mariscal M.D.   On: 11/25/2014 09:05   Ct Abdomen Pelvis W Contrast  11/25/2014  CLINICAL DATA:  Abdominal pain and abnormal liver function tests after recent DKA. EXAM: CT ABDOMEN AND PELVIS WITH CONTRAST TECHNIQUE: Multidetector CT imaging of the abdomen and pelvis was performed using the standard protocol following bolus administration of intravenous contrast. CONTRAST:  169mL OMNIPAQUE IOHEXOL 300 MG/ML  SOLN COMPARISON:  None. FINDINGS: Lower chest and abdominal wall: Small right pleural effusion which is layering. Lung opacity at the right base has a linear morphology favoring atelectasis. No definitive pneumonia. Hepatobiliary: Large (22 cm craniocaudal span) and steatotic appearing liver. There is a lobulated mass in segment 5 measuring up to 27 mm, size stable compared to 09/21/2014 sonography. Other masses  on prior sonography are not visualized on this single phase study. Liver lesions on previous ultrasound had a indeterminate appearance with hypoechoic halos. Cholelithiasis. No evidence of acute cholecystitis. Pancreas: Diffuse retroperitoneal edema without notable pancreas expansion. No ductal enlargement. Spleen: Unremarkable. Adrenals/Urinary Tract: Negative adrenals. No hydronephrosis or stone. Dilated bladder without wall thickening. Reproductive:No pathologic findings. Stomach/Bowel:  No obstruction. Vascular/Lymphatic: No acute vascular abnormality. No mass or adenopathy. Peritoneal: No ascites or pneumoperitoneum. Musculoskeletal: No acute abnormalities. IMPRESSION: 1. Retroperitoneal edema partially attributed to pancreatitis given serum enzymes. 2. Volume overload with small right pleural effusion, ascites, and retroperitoneal edema. 3. 27 mm segment 5 liver mass. Additional masses seen by sonography 09/21/2014 are not visualized today. When the patient is clinically stable and able to follow directions and hold their breath (preferably as an outpatient) further evaluation with liver MRI recommended for definitive characterization. 4. Hepatomegaly and steatosis. 5. Cholelithiasis. Electronically Signed   By: Monte Fantasia M.D.   On: 11/25/2014 23:41   Dg Chest Portable 1 View  11/24/2014  CLINICAL DATA:  Shortness of breath.  Pain EXAM: PORTABLE CHEST 1 VIEW COMPARISON:  09/18/2014 FINDINGS: The heart size and mediastinal contours are within normal limits. Both lungs are clear. The visualized skeletal structures are unremarkable. IMPRESSION: No active disease. Electronically Signed   By: Kerby Moors M.D.   On: 11/24/2014 10:11   Dg Abd Portable 1v  11/25/2014  CLINICAL DATA:  Generalized abdominal pain EXAM: PORTABLE ABDOMEN - 1 VIEW COMPARISON:  None. FINDINGS: The bowel gas pattern is normal. No radio-opaque calculi or other significant radiographic abnormality are seen. IMPRESSION: Negative.  Electronically Signed   By: Andreas Newport M.D.   On: 11/25/2014 05:44      Medical  Consultants:    PCCM  Anti-Infectives:   Anti-infectives    Start     Dose/Rate Route Frequency Ordered Stop   11/26/14 1430  vancomycin (VANCOCIN) IVPB 750 mg/150 ml premix  Status:  Discontinued     750 mg 150 mL/hr over 60 Minutes Intravenous 3 times per day 11/26/14 1408 11/26/14 1417   11/25/14 0500  vancomycin (VANCOCIN) 500 mg in sodium chloride 0.9 % 100 mL IVPB  Status:  Discontinued     500 mg 100 mL/hr over 60 Minutes Intravenous Every 8 hours 11/24/14 1913 11/26/14 1407   11/25/14 0300  piperacillin-tazobactam (ZOSYN) IVPB 3.375 g     3.375 g 12.5 mL/hr over 240 Minutes Intravenous Every 8 hours 11/24/14 1913     11/24/14 1915  piperacillin-tazobactam (ZOSYN) IVPB 3.375 g     3.375 g 100 mL/hr over 30 Minutes Intravenous  Once 11/24/14 1901 11/24/14 2148   11/24/14 1915  vancomycin (VANCOCIN) IVPB 1000 mg/200 mL premix     1,000 mg 200 mL/hr over 60 Minutes Intravenous  Once 11/24/14 1901 11/24/14 2219      Subjective:   [name] continues to have nausea and dry heaves.  No dyspnea/cough.  Bowels moved yesterday, formed stool.  Has flank and abdominal pain, occasional headache.      Objective:    Filed Vitals:   11/27/14 0500 11/27/14 0600 11/27/14 0700 11/27/14 0800  BP:      Pulse: 108 116 101 102  Temp:    99 F (37.2 C)  TempSrc:    Oral  Resp: 22 25 23 24   Height:      Weight:      SpO2: 99% 100% 100% 100%    Intake/Output Summary (Last 24 hours) at 11/27/14 0856 Last data filed at 11/27/14 0800  Gross per 24 hour  Intake 2708.51 ml  Output   2125 ml  Net 583.51 ml   Filed Weights   11/24/14 1107 11/24/14 1200 11/25/14 0500  Weight: 50.485 kg (111 lb 4.8 oz) 51.1 kg (112 lb 10.5 oz) 57.4 kg (126 lb 8.7 oz)    Exam: Gen:  NAD Cardiovascular:  Tachy, No M/R/G Respiratory:  Lungs CTAB Gastrointestinal:  Abdomen soft, NT/ND, +  BS Extremities:  1+ edema   Data Reviewed:    Labs: Basic Metabolic Panel:  Recent Labs Lab 11/24/14 1416  11/25/14 0624  11/25/14 1715 11/25/14 2116 11/26/14 0126 11/26/14 0512 11/26/14 1305 11/27/14 0340  NA 145  < >  --   < > 140 139 138  --  139 141  K 4.8  < >  --   < > 3.3* 2.3* 2.6*  --  2.8* 3.7  CL 115*  < >  --   < > 111 108 104  --  106 109  CO2 7*  < >  --   < > 14* 20* 21*  --  20* 25  GLUCOSE 327*  < >  --   < > 137* 144* 168*  --  181* 113*  BUN 12  < >  --   < > <5* <5* <5*  --  <5* <5*  CREATININE 1.10*  < >  --   < > 0.59 0.62 0.61  --  0.72 0.45  CALCIUM 8.7*  < >  --   < > 7.2* 7.2* 7.3*  --  7.3* 7.6*  MG 2.0  --  1.5*  --   --   --   --  1.5*  --  1.9  PHOS 3.7  --  1.9*  --   --   --   --  <1.1*  --  1.8*  < > = values in this interval not displayed. GFR Estimated Creatinine Clearance: 92.8 mL/min (by C-G formula based on Cr of 0.45). Liver Function Tests:  Recent Labs Lab 11/24/14 0947 11/25/14 0346 11/26/14 0512  AST 84* 51* 70*  ALT 58* 36 37  ALKPHOS 147* 102 92  BILITOT 2.7* 0.8 0.6  PROT 8.2* 6.0* 5.2*  ALBUMIN 4.4 3.1* 2.6*    Recent Labs Lab 11/24/14 0950 11/25/14 0624 11/25/14 2116 11/26/14 0512  LIPASE 30 386* 343* 270*  AMYLASE  --  378* 392*  --    Coagulation profile  Recent Labs Lab 11/24/14 1919  INR 1.31    CBC:  Recent Labs Lab 11/24/14 0947 11/24/14 0956 11/24/14 1416 11/25/14 0624 11/25/14 2116 11/26/14 0512  WBC 10.4  --  17.5* 10.7* 7.6 8.2  NEUTROABS 6.8  --   --   --  5.5  --   HGB 14.8 17.0* 13.8 13.8 11.2* 11.4*  HCT 44.8 50.0* 44.7 43.2 33.6* 33.8*  MCV 99.6  --  104.0* 100.7* 94.6 95.8  PLT 413*  --  444* 317 281 295   Cardiac Enzymes:  Recent Labs Lab 11/25/14 0624 11/25/14 1330 11/25/14 2116  CKTOTAL 41  --   --   CKMB 5.1*  --   --   TROPONINI <0.03 <0.03 <0.03   CBG:  Recent Labs Lab 11/26/14 1607 11/26/14 1953 11/26/14 2246 11/27/14 0311 11/27/14 0735  GLUCAP  255* 123* 98 101* 157*   Sepsis Labs:  Recent Labs Lab 11/24/14 1416  11/25/14 0346 11/25/14 0624  11/25/14 1330 11/25/14 2116 11/26/14 0126 11/26/14 0512 11/27/14 0340  PROCALCITON  --   < > 0.15 0.17  --   --   --   --  0.15 0.18  WBC 17.5*  --   --  10.7*  --   --  7.6  --  8.2  --   LATICACIDVEN  --   < > 4.7*  --   < > 5.6* 7.5* 7.3*  --  4.6*  < > = values in this interval not displayed. Microbiology Recent Results (from the past 240 hour(s))  MRSA PCR Screening     Status: None   Collection Time: 11/24/14 12:00 PM  Result Value Ref Range Status   MRSA by PCR NEGATIVE NEGATIVE Final    Comment:        The GeneXpert MRSA Assay (FDA approved for NASAL specimens only), is one component of a comprehensive MRSA colonization surveillance program. It is not intended to diagnose MRSA infection nor to guide or monitor treatment for MRSA infections.   Culture, blood (routine x 2)     Status: None (Preliminary result)   Collection Time: 11/24/14  8:15 PM  Result Value Ref Range Status   Specimen Description BLOOD LEFT ARM  Final   Special Requests   Final    BOTTLES DRAWN AEROBIC AND ANAEROBIC [place] BOTH BOTTLES   Culture   Final    NO GROWTH 1 DAY Performed at [facility]    Report Status PENDING  Incomplete  Culture, blood (routine x 2)     Status: None (Preliminary result)   Collection Time: 11/24/14  8:20 PM  Result Value Ref Range Status   Specimen Description BLOOD RIGHT HAND  Final   Special Requests BOTTLES  DRAWN AEROBIC ONLY 10CC  Final   Culture   Final    NO GROWTH 1 DAY Performed at [facility]    Report Status PENDING  Incomplete     Medications:   . antiseptic oral rinse  7 mL Mouth Rinse q12n4p  . budesonide-formoterol  2 puff Inhalation BID  . chlorhexidine  15 mL Mouth Rinse BID  . enoxaparin (LOVENOX) injection  40 mg Subcutaneous Daily  . insulin aspart  0-15 Units Subcutaneous 6 times per day  . insulin glargine  40 Units  Subcutaneous Q24H  . montelukast  10 mg Oral Daily  . piperacillin-tazobactam (ZOSYN)  IV  3.375 g Intravenous Q8H   Continuous Infusions: . sodium chloride 10 mL/hr at 11/27/14 0518  . dextrose 5 % and 0.9 % NaCl with KCl 40 mEq/L 100 mL/hr at 11/27/14 0517    Time spent: 35 minutes.  The patient is medically complex with multiple co-morbidities and is at high risk for clinical deterioration and requires high complexity decision making.    LOS: 3 days   [place] [facility] Pager [phone]. If unable to reach me by pager, please call my cell phone at [phone].  *Please refer to [email or web address], password [email or web address] to get updated schedule on who will round on this patient, as hospitalists switch teams weekly. If 7PM-7AM, please contact night-coverage at [email or web address], password [email or web address] for any overnight needs.  11/27/2014, 8:56 AM

## 2014-11-27 NOTE — Consult Note (Signed)
[facility] Consultation Note  Referring Provider: Dr. Jacquelynn Cree [facility]) Primary Care Physician:  Coletta Memos, PA-C  Reason for Consultation:  Pancreatitis   HPI: [name] is a 24 y.o. female whom we've been asked to see for pancreatitis.  Recent admission for DKA, found to have elevated lipase, CT showed liver lesions and gallstones and retroperitoneal edema around pancreas which could represent pancreatitis.  Liver tests slightly elevated.  No alcohol.  Denies prior pancreatitis.     Past Medical History  Diagnosis Date  . Diabetes mellitus   . Asthma   . Hypertension   . Headache(784.0)   . Pancreatitis, acute 11/26/2014  . Hepatic steatosis 11/26/2014  . Liver mass 11/26/2014    Past Surgical History  Procedure Laterality Date  . Wisdom tooth extraction    . Esophagogastroduodenoscopy (egd) with propofol Left 09/20/2014    Procedure: ESOPHAGOGASTRODUODENOSCOPY (EGD) WITH PROPOFOL;  Surgeon: Arta Silence, MD;  Location: [facility] ENDOSCOPY;  Service: Endoscopy;  Laterality: Left;    Prior to Admission medications   Medication Sig Start Date End Date Taking? Authorizing Provider  albuterol (PROVENTIL HFA;VENTOLIN HFA) 108 (90 BASE) MCG/ACT inhaler Inhale 2 puffs into the lungs every 6 (six) hours as needed for shortness of breath.   Yes Historical Provider, MD  albuterol (PROVENTIL) (2.5 MG/3ML) 0.083% nebulizer solution Take 2.5 mg by nebulization every 6 (six) hours as needed for shortness of breath.   Yes Historical Provider, MD  budesonide-formoterol (SYMBICORT) 160-4.5 MCG/ACT inhaler Inhale 2 puffs into the lungs 2 (two) times daily.   Yes Historical Provider, MD  EPINEPHrine (EPIPEN) 0.3 mg/0.3 mL SOAJ injection Inject 0.3 mg into the muscle once.   Yes Historical Provider, MD  glucagon 1 MG injection Inject 1 mg into the muscle as needed. FOR LOW BLOOD SUGAR 01/31/14 01/31/15 Yes Historical Provider, MD  insulin aspart (NOVOLOG FLEXPEN) 100 UNIT/ML FlexPen  INJECT 0-20 UNITS INTO THE SKIN AS DIRECTED. SSI - CARB SCALE, 0-20 UNITS Patient taking differently: Inject into the skin 4 (four) times daily as needed for high blood sugar. Sliding scale 10/19/13  Yes Elayne Snare, MD  Insulin Glargine (LANTUS SOLOSTAR) 100 UNIT/ML Solostar Pen Inject 36 Units into the skin daily at 10 pm. 10/19/13  Yes Elayne Snare, MD  montelukast (SINGULAIR) 10 MG tablet Take 10 mg by mouth daily.   Yes Historical Provider, MD  pantoprazole (PROTONIX) 40 MG tablet Take 1 tablet (40 mg total) by mouth daily. Patient not taking: Reported on 11/24/2014 09/21/14   Florencia Reasons, MD    Current Facility-Administered Medications  Medication Dose Route Frequency Provider Last Rate Last Dose  . 0.9 %  sodium chloride infusion   Intravenous Continuous Venetia Maxon Rama, MD 10 mL/hr at 11/27/14 0518    . albuterol (PROVENTIL) (2.5 MG/3ML) 0.083% nebulizer solution 2.5 mg  2.5 mg Nebulization Q6H PRN Christina P Rama, MD      . antiseptic oral rinse (CPC / CETYLPYRIDINIUM CHLORIDE 0.05%) solution 7 mL  7 mL Mouth Rinse q12n4p Venetia Maxon Rama, MD   7 mL at 11/26/14 1619  . budesonide-formoterol (SYMBICORT) 160-4.5 MCG/ACT inhaler 2 puff  2 puff Inhalation BID Venetia Maxon Rama, MD   2 puff at 11/26/14 5462  . chlorhexidine (PERIDEX) 0.12 % solution 15 mL  15 mL Mouth Rinse BID Venetia Maxon Rama, MD   15 mL at 11/27/14 1026  . dextrose 5 % and 0.9 % NaCl with KCl 40 mEq/L infusion   Intravenous Continuous Venetia Maxon Rama, MD 100  mL/hr at 11/27/14 0517    . enoxaparin (LOVENOX) injection 40 mg  40 mg Subcutaneous Daily Venetia Maxon Rama, MD   40 mg at 11/27/14 1022  . insulin aspart (novoLOG) injection 0-15 Units  0-15 Units Subcutaneous 6 times per day Venetia Maxon Rama, MD   3 Units at 11/27/14 1159  . insulin glargine (LANTUS) injection 40 Units  40 Units Subcutaneous Q24H Venetia Maxon Rama, MD   40 Units at 11/27/14 1021  . ketorolac (TORADOL) 30 MG/ML injection 30 mg  30 mg Intravenous Q6H PRN Venetia Maxon Rama, MD   30 mg at 11/27/14 0849  . montelukast (SINGULAIR) tablet 10 mg  10 mg Oral Daily Venetia Maxon Rama, MD   10 mg at 11/27/14 1022  . ondansetron (ZOFRAN) injection 4 mg  4 mg Intravenous Q4H PRN Venetia Maxon Rama, MD   4 mg at 11/27/14 1036  . piperacillin-tazobactam (ZOSYN) IVPB 3.375 g  3.375 g Intravenous Q8H Anh P Pham, RPH   3.375 g at 11/27/14 1140  . potassium phosphate 20 mmol in dextrose 5 % 500 mL infusion  20 mmol Intravenous Once Venetia Maxon Rama, MD   20 mmol at 11/27/14 1014  . promethazine (PHENERGAN) injection 12.5 mg  12.5 mg Intravenous Q4H PRN Venetia Maxon Rama, MD   12.5 mg at 11/27/14 1140    Allergies as of 11/24/2014 - Review Complete 11/24/2014  Allergen Reaction Noted  . Peanut-containing drug products Swelling 12/04/2011  . Strawberry extract Swelling 12/04/2011    Family History  Problem Relation Age of Onset  . Diabetes Mother   . Hyperlipidemia Mother   . Hypertension Father   . Heart disease Father   . Heart disease Maternal Grandmother   . Heart disease Maternal Grandfather   . Hypertension Paternal Grandmother   . Cancer Paternal Grandfather     Social History   Social History  . Marital Status: Single    Spouse Name: N/A  . Number of Children: 1  . Years of Education: 12   Occupational History  . Unemployed.    Social History Main Topics  . Smoking status: Never Smoker   . Smokeless tobacco: Never Used  . Alcohol Use: No  . Drug Use: Yes    Special: Marijuana  . Sexual Activity: Not Currently   Other Topics Concern  . Not on file   Social History Narrative   Lives with her mother.  Unemployed.  Trying to qualify for disability.    Review of Systems: ROS Dr. Rockne Menghini 11/24/14 reviewed and I agree  Physical Exam: Vital signs in last 24 hours: Temp:  [98.8 F (37.1 C)-99.4 F (37.4 C)] 98.8 F (37.1 C) (11/08 1200) Pulse Rate:  [100-123] 102 (11/08 1300) Resp:  [15-26] 25 (11/08 1300) BP: (133-146)/(98-112) 137/112 mmHg  (11/08 1100) SpO2:  [99 %-100 %] 100 % (11/08 1300) Last BM Date: 11/27/14 General:   Alert,  Well-developed, well-nourished, pleasant and cooperative in NAD Head:  Normocephalic and atraumatic. Eyes:  Sclera clear, no icterus.   Conjunctiva pink. Ears:  Normal auditory acuity. Nose:  No deformity, discharge,  or lesions. Mouth:  No deformity or lesions.  Dry mucous membranes Abdomen:  Soft, nontender and nondistended. No masses, hepatosplenomegaly or hernias noted. Normal bowel sounds, without guarding, and without rebound.     Psych:  Flat affect, Alert and cooperative. Depressed mood   Lab Results:  Recent Labs  11/25/14 0624 11/25/14 2116 11/26/14 0512  WBC 10.7* 7.6 8.2  HGB  13.8 11.2* 11.4*  HCT 43.2 33.6* 33.8*  PLT 317 281 295   BMET  Recent Labs  11/26/14 0126 11/26/14 1305 11/27/14 0340  NA 138 139 141  K 2.6* 2.8* 3.7  CL 104 106 109  CO2 21* 20* 25  GLUCOSE 168* 181* 113*  BUN <5* <5* <5*  CREATININE 0.61 0.72 0.45  CALCIUM 7.3* 7.3* 7.6*   LFT  Recent Labs  11/26/14 0512  PROT 5.2*  ALBUMIN 2.6*  AST 70*  ALT 37  ALKPHOS 92  BILITOT 0.6  BILIDIR 0.1  IBILI 0.5   PT/INR  Recent Labs  11/24/14 1919  LABPROT 16.4*  INR 1.31    Studies/Results: Ct Abdomen Pelvis W Contrast  11/25/2014  CLINICAL DATA:  Abdominal pain and abnormal liver function tests after recent DKA. EXAM: CT ABDOMEN AND PELVIS WITH CONTRAST TECHNIQUE: Multidetector CT imaging of the abdomen and pelvis was performed using the standard protocol following bolus administration of intravenous contrast. CONTRAST:  114mL OMNIPAQUE IOHEXOL 300 MG/ML  SOLN COMPARISON:  None. FINDINGS: Lower chest and abdominal wall: Small right pleural effusion which is layering. Lung opacity at the right base has a linear morphology favoring atelectasis. No definitive pneumonia. Hepatobiliary: Large (22 cm craniocaudal span) and steatotic appearing liver. There is a lobulated mass in segment 5  measuring up to 27 mm, size stable compared to 09/21/2014 sonography. Other masses on prior sonography are not visualized on this single phase study. Liver lesions on previous ultrasound had a indeterminate appearance with hypoechoic halos. Cholelithiasis. No evidence of acute cholecystitis. Pancreas: Diffuse retroperitoneal edema without notable pancreas expansion. No ductal enlargement. Spleen: Unremarkable. Adrenals/Urinary Tract: Negative adrenals. No hydronephrosis or stone. Dilated bladder without wall thickening. Reproductive:No pathologic findings. Stomach/Bowel:  No obstruction. Vascular/Lymphatic: No acute vascular abnormality. No mass or adenopathy. Peritoneal: No ascites or pneumoperitoneum. Musculoskeletal: No acute abnormalities. IMPRESSION: 1. Retroperitoneal edema partially attributed to pancreatitis given serum enzymes. 2. Volume overload with small right pleural effusion, ascites, and retroperitoneal edema. 3. 27 mm segment 5 liver mass. Additional masses seen by sonography 09/21/2014 are not visualized today. When the patient is clinically stable and able to follow directions and hold their breath (preferably as an outpatient) further evaluation with liver MRI recommended for definitive characterization. 4. Hepatomegaly and steatosis. 5. Cholelithiasis. Electronically Signed   By: Monte Fantasia M.D.   On: 11/25/2014 23:41    Impression:  1.  Pancreatitis.  She denies significant abdominal pain at this time. 2.  Gallstones. 3.  Brittle diabetes with recurrent admissions for diabetic ketoacidosis with nausea and vomiting. 4.  Liver lesions, unclear significance.  Plan:  1.  MRI/MRCP, to assess liver lesions, assess for pancreas divisum, assess for choledocholithiasis. 2.  Next step in management pending MRI/MRCP findings.   LOS: 3 days   Domique Reardon M  11/27/2014, 1:34 PM  Pager [phone] If no answer or after 5 PM call [phone]

## 2014-11-27 NOTE — Progress Notes (Signed)
CRITICAL VALUE ALERT  Critical value received:  Lactate 4.6  Date of notification:  11/27/2014  Time of notification:  4259  Critical value read back:Yes.    Nurse who received alert:  c Kyvon Hu  MD notified (1st page): n/a-value improved from previous values Time of first page:  n/a MD notified (2nd page):  Time of second page:  Responding MD: n/a Time MD responded:  n/a

## 2014-11-28 DIAGNOSIS — E1010 Type 1 diabetes mellitus with ketoacidosis without coma: Secondary | ICD-10-CM

## 2014-11-28 DIAGNOSIS — N179 Acute kidney failure, unspecified: Secondary | ICD-10-CM

## 2014-11-28 LAB — GLUCOSE, CAPILLARY
GLUCOSE-CAPILLARY: 113 mg/dL — AB (ref 65–99)
GLUCOSE-CAPILLARY: 127 mg/dL — AB (ref 65–99)
GLUCOSE-CAPILLARY: 197 mg/dL — AB (ref 65–99)
Glucose-Capillary: 129 mg/dL — ABNORMAL HIGH (ref 65–99)
Glucose-Capillary: 155 mg/dL — ABNORMAL HIGH (ref 65–99)
Glucose-Capillary: 177 mg/dL — ABNORMAL HIGH (ref 65–99)
Glucose-Capillary: 80 mg/dL (ref 65–99)

## 2014-11-28 LAB — CBC
HCT: 32.4 % — ABNORMAL LOW (ref 36.0–46.0)
Hemoglobin: 10.8 g/dL — ABNORMAL LOW (ref 12.0–15.0)
MCH: 32.1 pg (ref 26.0–34.0)
MCHC: 33.3 g/dL (ref 30.0–36.0)
MCV: 96.4 fL (ref 78.0–100.0)
PLATELETS: 267 10*3/uL (ref 150–400)
RBC: 3.36 MIL/uL — ABNORMAL LOW (ref 3.87–5.11)
RDW: 13.7 % (ref 11.5–15.5)
WBC: 4.3 10*3/uL (ref 4.0–10.5)

## 2014-11-28 LAB — BASIC METABOLIC PANEL
Anion gap: 6 (ref 5–15)
CHLORIDE: 106 mmol/L (ref 101–111)
CO2: 27 mmol/L (ref 22–32)
CREATININE: 0.49 mg/dL (ref 0.44–1.00)
Calcium: 8.5 mg/dL — ABNORMAL LOW (ref 8.9–10.3)
GFR calc Af Amer: >60 mL/min (ref >60)
GFR calc non Af Amer: >60 mL/min (ref >60)
Glucose, Bld: 116 mg/dL — ABNORMAL HIGH (ref 65–99)
Potassium: 3.5 mmol/L (ref 3.5–5.1)
SODIUM: 139 mmol/L (ref 135–145)

## 2014-11-28 LAB — PHOSPHORUS: Phosphorus: 2.2 mg/dL — ABNORMAL LOW (ref 2.5–4.6)

## 2014-11-28 LAB — MAGNESIUM: Magnesium: 1.9 mg/dL (ref 1.7–2.4)

## 2014-11-28 LAB — LACTIC ACID, PLASMA: Lactic Acid, Venous: 3.2 mmol/L (ref 0.5–2.0)

## 2014-11-28 MED ORDER — OXYCODONE-ACETAMINOPHEN 5-325 MG PO TABS
1.0000 | ORAL_TABLET | ORAL | Status: DC | PRN
  Administered 2014-11-28 (×2): 2 via ORAL
  Filled 2014-11-28 (×2): qty 2

## 2014-11-28 MED ORDER — LABETALOL HCL 5 MG/ML IV SOLN
10.0000 mg | INTRAVENOUS | Status: DC | PRN
  Administered 2014-11-28: 10 mg via INTRAVENOUS
  Filled 2014-11-28 (×2): qty 4

## 2014-11-28 MED ORDER — POTASSIUM PHOSPHATES 15 MMOLE/5ML IV SOLN
30.0000 mmol | Freq: Once | INTRAVENOUS | Status: AC
  Administered 2014-11-28: 30 mmol via INTRAVENOUS
  Filled 2014-11-28: qty 10

## 2014-11-28 NOTE — Progress Notes (Signed)
MEDICATION RELATED CONSULT NOTE - INITIAL   Pharmacy Consult for Phosphorous replacment  Allergies  Allergen Reactions  . Peanut-Containing Drug Products Swelling    Swelling of mouth, lips  . Strawberry Extract Swelling    Swelling of mouth, lips    Patient Measurements: Height: 5\' 4"  (162.6 cm) Weight: 121 lb 14.6 oz (55.3 kg) IBW/kg (Calculated) : 54.7 Adjusted Body Weight:   Vital Signs: Temp: 98.5 F (36.9 C) (11/09 0800) Temp Source: Oral (11/09 0800) BP: 126/99 mmHg (11/09 0600) Pulse Rate: 109 (11/09 0800) Intake/Output from previous day: 11/08 0701 - 11/09 0700 In: 2562.3 [I.V.:1955.7; IV Piggyback:606.7] Out: 8250 [Urine:4390] Intake/Output from this shift: Total I/O In: -  Out: 700 [Urine:700]  Labs:  Recent Labs  11/25/14 2116  11/26/14 0512 11/26/14 1305 11/27/14 0340 11/28/14 0337  WBC 7.6  --  8.2  --   --  4.3  HGB 11.2*  --  11.4*  --   --  10.8*  HCT 33.6*  --  33.8*  --   --  32.4*  PLT 281  --  295  --   --  267  CREATININE 0.62  < >  --  0.72 0.45 0.49  MG  --   --  1.5*  --  1.9 1.9  PHOS  --   --  <1.1*  --  1.8* 2.2*  ALBUMIN  --   --  2.6*  --   --   --   PROT  --   --  5.2*  --   --   --   AST  --   --  70*  --   --   --   ALT  --   --  37  --   --   --   ALKPHOS  --   --  92  --   --   --   BILITOT  --   --  0.6  --   --   --   BILIDIR  --   --  0.1  --   --   --   IBILI  --   --  0.5  --   --   --   < > = values in this interval not displayed. Estimated Creatinine Clearance: 92.8 mL/min (by C-G formula based on Cr of 0.49).  Medical History: Past Medical History  Diagnosis Date  . Diabetes mellitus   . Asthma   . Hypertension   . Headache(784.0)   . Pancreatitis, acute 11/26/2014  . Hepatic steatosis 11/26/2014  . Liver mass 11/26/2014   Assessment: 79 YOF presents with headache, found to be in DKA. Continues with N/V, electrolyte imbalance.  Requested Phosphorus replacement by Rx beginning 11/6.   Phos 1.9 on 11/6,  received K Phos 20 mMol boluses daily 11/6-11/9  Phos level this am 2.2, K 3.5    Lactic acid improved from high 7.5 (3.2 this am)  Goal of Therapy:  Replete phos  Plan:   Potassium phosphate 58mmol IVPB x 1 today  Continue daily Bmet, Phosphorus level in am [place] Levels PharmD Pager [phone] 11/28/2014, 12:14 PM

## 2014-11-28 NOTE — Progress Notes (Signed)
Progress Note   [name] YNW:[id number] DOB: [birth year] DOA: 11/24/2014 PCP: Coletta Memos, PA-C   Brief Narrative:   [name] is an 24 y.o. female with a PMH of type 1 diabetes, hypertension, asthma, and migraine headaches who was admitted 11/24/14 with a 2 week history of migraine headaches and DKA, was found to have pancreatitis of unclear etiology. MRCP did not re veal a pancreatic division or any choledocholithiasis.   Assessment/Plan:   Principal Problem:  DKA, type 1 ([place]) in a patient with uncontrolled type 1 diabetes mellitus ([place]) / sepsis - Status post aggressive volume replacement. Anion gap/acidosis resolved 11/26/14. - Continue aggressive electrolyte replacement as needed. - she was started epirically on IV antibiotics for elevated lactic acid and DKA.  - Blood cultures negative to date so far. Pro calcitonin reassuring. Lactic acid improving.  - Started on empiric vancomycin and Zosyn 11/24/14. Vancomycin discontinued 11/26/14. Plan to discontinue zosyn tomorrow, as MRI or ct did not show any necrotizing pancreatitis.  - Transitioned to basal/bolus insulin 11/26/14 .  -  CBG (last 3)   Recent Labs  11/28/14 0005 11/28/14 0405 11/28/14 0718  GLUCAP 129* 113* 197*     - DM coordinator & dietician consultations performed. - currently onclear liquid diet, advanced diet to full liquid.   Active Problems:   Volume overload in the setting of aggressive fluid volume resuscitation  - Given Lasix 11/25/14. Respiratory status stable.    Acute pancreatitis with elevated lipase and amylase, intractable nausea and vomiting - Abdominal ultrasound unrevealing. - CT of the abdomen and pelvis done 11/25/14: Volume overload, diffuse retroperitoneal edema, pancreatitis. -  Symptoms improving.  - GI consultation requested. Recommended MRCP for evaluation of pancreatic division and choledocholithiasis, which were negative, showed some benign hepatic adenomas  ,recommend follow up MRI in 6 months.  - check lipase levels in am.     Sinus tachycardia - possibly from pancreatitis and DKA, improving. Asymptomatic.     Hypomagnesemia/hypophosphatemia/hypokalemia - Monitor and replace electrolytes as needed.   Asthma - Continue Symbicort, Singulair and bronchodilators as needed.   Elevated LFTs/hepatic steatosis/liver mass - Monitor. Abdominal ultrasound shows hepatic steatosis. LFTs have improved with hydration. - Tylenol level not elevated. - Liver mass noted on CT abdomen, , MRI of the abdomen showed benign hepatic adenomas, recommended follow up MRI with and without contrast in 6 months.    Acute kidney injury ([place]) - Secondary to osmotic diuresis. Creatinine normalized with IV fluids.   Migraine headache - Toradol ordered as needed.    DVT prophylaxis - Lovenox ordered.   Family Communication/Anticipated D/C date and plan/Code Status   Family Communication: none at bedside.  Disposition Plan: transfer patient to med surg.  Anticipated D/C date: pending resolution of pancreatitis.  Code Status:     Code Status Orders        Start     Ordered   11/24/14 1308  Full code   Continuous     11/24/14 1309       IV Access:    Peripheral IV   Procedures and diagnostic studies:   US Abdomen Complete  11/25/2014  CLINICAL DATA:  History of diabetic ketoacidosis and acute renal failure. EXAM: ULTRASOUND ABDOMEN COMPLETE COMPARISON:  Right upper quadrant abdominal ultrasound - 09/21/2014; abdominal ultrasound - 02/27/2014; abdominal radiograph -11/25/2014 FINDINGS: Gallbladder: Sonographically normal. No echogenic gallstones or gall sludge. No gallbladder wall thickening or pericholecystic fluid. Negative sonographic Murphy's sign. Common bile duct:  Normal in size  measuring 2.7 mm in diameter Liver: There is mild diffuse increased echogenicity of the hepatic parenchyma. There is an approximately 2.4 x 3.1 x 2.5 cm nearly  isoechoic ill-defined nodule within the right lobe of the liver (image 29) as well as an additional isoechoic approximately 2.0 x 1.4 x 2.3 cm nodule within the central aspect of the right lobe of the liver (image 33), both of which appear unchanged since the 09/2014 examination. No new discrete hepatic lesions. No intrahepatic biliary duct dilatation. No ascites. IVC: No abnormality visualized. Pancreas: Limited visualization of the pancreatic head and neck is normal. Visualization of the pancreatic body and tail is obscured by bowel gas. Spleen: Normal in size measuring 5.3 cm in length Right Kidney: Normal cortical thickness, echogenicity and size, measuring 12.5 cm in length. No focal renal lesions. No echogenic renal stones. No urinary obstruction. Left Kidney: Normal cortical thickness, echogenicity and size, measuring 11.7 cm in length. No focal renal lesions. No echogenic renal stones. No urinary obstruction. Abdominal aorta: No aneurysm visualized. Other findings: None. IMPRESSION: 1. No definite explanation for patient's acute renal failure. Specifically, no evidence of urinary obstruction. 2. Similar findings of suspected hepatic steatosis. 3. Ill-defined nearly isoechoic nodules within the right lobe of the liver appear grossly unchanged since the 09/2014 examination and while technically indeterminate, in the absence of a known primary malignancy, these nodules are favored to be benign in etiology potentially representative of hemangiomas or FNH. Further evaluation with nonemergent abdominal MRI could be performed as clinically indicated. Electronically Signed   By: Sandi Mariscal M.D.   On: 11/25/2014 09:05   Ct Abdomen Pelvis W Contrast  11/25/2014  CLINICAL DATA:  Abdominal pain and abnormal liver function tests after recent DKA. EXAM: CT ABDOMEN AND PELVIS WITH CONTRAST TECHNIQUE: Multidetector CT imaging of the abdomen and pelvis was performed using the standard protocol following bolus  administration of intravenous contrast. CONTRAST:  178mL OMNIPAQUE IOHEXOL 300 MG/ML  SOLN COMPARISON:  None. FINDINGS: Lower chest and abdominal wall: Small right pleural effusion which is layering. Lung opacity at the right base has a linear morphology favoring atelectasis. No definitive pneumonia. Hepatobiliary: Large (22 cm craniocaudal span) and steatotic appearing liver. There is a lobulated mass in segment 5 measuring up to 27 mm, size stable compared to 09/21/2014 sonography. Other masses on prior sonography are not visualized on this single phase study. Liver lesions on previous ultrasound had a indeterminate appearance with hypoechoic halos. Cholelithiasis. No evidence of acute cholecystitis. Pancreas: Diffuse retroperitoneal edema without notable pancreas expansion. No ductal enlargement. Spleen: Unremarkable. Adrenals/Urinary Tract: Negative adrenals. No hydronephrosis or stone. Dilated bladder without wall thickening. Reproductive:No pathologic findings. Stomach/Bowel:  No obstruction. Vascular/Lymphatic: No acute vascular abnormality. No mass or adenopathy. Peritoneal: No ascites or pneumoperitoneum. Musculoskeletal: No acute abnormalities. IMPRESSION: 1. Retroperitoneal edema partially attributed to pancreatitis given serum enzymes. 2. Volume overload with small right pleural effusion, ascites, and retroperitoneal edema. 3. 27 mm segment 5 liver mass. Additional masses seen by sonography 09/21/2014 are not visualized today. When the patient is clinically stable and able to follow directions and hold their breath (preferably as an outpatient) further evaluation with liver MRI recommended for definitive characterization. 4. Hepatomegaly and steatosis. 5. Cholelithiasis. Electronically Signed   By: Monte Fantasia M.D.   On: 11/25/2014 23:41   Mr 3d Recon At Scanner  11/27/2014  CLINICAL DATA:  Pancreatitis, gallstones, liver lesion on CT EXAM: MRI ABDOMEN WITHOUT AND WITH CONTRAST (INCLUDING MRCP)  TECHNIQUE: Multiplanar multisequence MR imaging  of the abdomen was performed both before and after the administration of intravenous contrast. Heavily T2-weighted images of the biliary and pancreatic ducts were obtained, and three-dimensional MRCP images were rendered by post processing. CONTRAST:  72mL MULTIHANCE GADOBENATE DIMEGLUMINE 529 MG/ML IV SOLN COMPARISON:  CT abdomen pelvis dated 11/25/2014 FINDINGS: Lower chest: Small bilateral pleural effusions, right greater than left. Hepatobiliary: Moderate hepatic steatosis. At least four liver lesions are present, best visualized on the postcontrast subtraction images (series 11301): --1.8 x 2.7 cm lesion in segment 5 (image 70), with intracellular lipid, corresponding to the CT abnormality --12 mm lesion in segment 7 (image 47), with intracellular lipid --11 mm lesion in segment 8 (image 32) --7 mm lesion in segment 2 (image 31) Overall, these are favored to reflect hepatic adenomas. [place] is considered less likely given the lack of hypervascularity on CT. The enhancement pattern is not characteristic for hemangiomas. Layering tiny gallstones (series 12/ image 39). No associated inflammatory changes. No intrahepatic or extrahepatic ductal dilatation. Common duct measures 3 mm. No choledocholithiasis is seen. Pancreas: Mild peripancreatic inflammatory changes with prominence of the pancreatic head, likely reflecting acute pancreatitis. No pancreatic ductal dilatation. No evidence of pancreatic divisum. No peripancreatic pseudocyst. Spleen: Within normal limits. Adrenals/Urinary Tract: Adrenal glands are within normal limits. Kidneys are within normal limits.  No hydronephrosis. Stomach/Bowel: Stomach and visualized bowel are unremarkable. Vascular/Lymphatic: No evidence of abdominal aortic aneurysm. No suspicious abdominal lymphadenopathy. Other: No abdominal ascites. Musculoskeletal: No focal osseous lesions. IMPRESSION: No evidence of pancreatic divisum. Layering  tiny gallstones. No intrahepatic or extrahepatic ductal dilatation. Common duct measures 3 mm. No choledocholithiasis is seen. Four enhancing hepatic lesions, measuring up to 2.7 cm in segment 5, favored to reflect benign hepatic adenomas. Consider follow-up MRI abdomen with/without contrast in 6 months, ideally with Eovist contrast. If this patient is on OCPs, consider withdrawal prior to repeat imaging. Electronically Signed   By: Julian Hy M.D.   On: 11/27/2014 16:03   Dg Chest Portable 1 View  11/24/2014  CLINICAL DATA:  Shortness of breath.  Pain EXAM: PORTABLE CHEST 1 VIEW COMPARISON:  09/18/2014 FINDINGS: The heart size and mediastinal contours are within normal limits. Both lungs are clear. The visualized skeletal structures are unremarkable. IMPRESSION: No active disease. Electronically Signed   By: Kerby Moors M.D.   On: 11/24/2014 10:11   Dg Abd Portable 1v  11/25/2014  CLINICAL DATA:  Generalized abdominal pain EXAM: PORTABLE ABDOMEN - 1 VIEW COMPARISON:  None. FINDINGS: The bowel gas pattern is normal. No radio-opaque calculi or other significant radiographic abnormality are seen. IMPRESSION: Negative. Electronically Signed   By: Andreas Newport M.D.   On: 11/25/2014 05:44   Mr Abd W/wo Cm/mrcp  11/27/2014  CLINICAL DATA:  Pancreatitis, gallstones, liver lesion on CT EXAM: MRI ABDOMEN WITHOUT AND WITH CONTRAST (INCLUDING MRCP) TECHNIQUE: Multiplanar multisequence MR imaging of the abdomen was performed both before and after the administration of intravenous contrast. Heavily T2-weighted images of the biliary and pancreatic ducts were obtained, and three-dimensional MRCP images were rendered by post processing. CONTRAST:  87mL MULTIHANCE GADOBENATE DIMEGLUMINE 529 MG/ML IV SOLN COMPARISON:  CT abdomen pelvis dated 11/25/2014 FINDINGS: Lower chest: Small bilateral pleural effusions, right greater than left. Hepatobiliary: Moderate hepatic steatosis. At least four liver lesions are  present, best visualized on the postcontrast subtraction images (series 11301): --1.8 x 2.7 cm lesion in segment 5 (image 70), with intracellular lipid, corresponding to the CT abnormality --12 mm lesion in segment 7 (image 47),  with intracellular lipid --11 mm lesion in segment 8 (image 32) --7 mm lesion in segment 2 (image 31) Overall, these are favored to reflect hepatic adenomas. [place] is considered less likely given the lack of hypervascularity on CT. The enhancement pattern is not characteristic for hemangiomas. Layering tiny gallstones (series 12/ image 39). No associated inflammatory changes. No intrahepatic or extrahepatic ductal dilatation. Common duct measures 3 mm. No choledocholithiasis is seen. Pancreas: Mild peripancreatic inflammatory changes with prominence of the pancreatic head, likely reflecting acute pancreatitis. No pancreatic ductal dilatation. No evidence of pancreatic divisum. No peripancreatic pseudocyst. Spleen: Within normal limits. Adrenals/Urinary Tract: Adrenal glands are within normal limits. Kidneys are within normal limits.  No hydronephrosis. Stomach/Bowel: Stomach and visualized bowel are unremarkable. Vascular/Lymphatic: No evidence of abdominal aortic aneurysm. No suspicious abdominal lymphadenopathy. Other: No abdominal ascites. Musculoskeletal: No focal osseous lesions. IMPRESSION: No evidence of pancreatic divisum. Layering tiny gallstones. No intrahepatic or extrahepatic ductal dilatation. Common duct measures 3 mm. No choledocholithiasis is seen. Four enhancing hepatic lesions, measuring up to 2.7 cm in segment 5, favored to reflect benign hepatic adenomas. Consider follow-up MRI abdomen with/without contrast in 6 months, ideally with Eovist contrast. If this patient is on OCPs, consider withdrawal prior to repeat imaging. Electronically Signed   By: Julian Hy M.D.   On: 11/27/2014 16:03      Medical Consultants:    PCCM  Anti-Infectives:   Anti-infectives     Start     Dose/Rate Route Frequency Ordered Stop   11/26/14 1430  vancomycin (VANCOCIN) IVPB 750 mg/150 ml premix  Status:  Discontinued     750 mg 150 mL/hr over 60 Minutes Intravenous 3 times per day 11/26/14 1408 11/26/14 1417   11/25/14 0500  vancomycin (VANCOCIN) 500 mg in sodium chloride 0.9 % 100 mL IVPB  Status:  Discontinued     500 mg 100 mL/hr over 60 Minutes Intravenous Every 8 hours 11/24/14 1913 11/26/14 1407   11/25/14 0300  piperacillin-tazobactam (ZOSYN) IVPB 3.375 g     3.375 g 12.5 mL/hr over 240 Minutes Intravenous Every 8 hours 11/24/14 1913     11/24/14 1915  piperacillin-tazobactam (ZOSYN) IVPB 3.375 g     3.375 g 100 mL/hr over 30 Minutes Intravenous  Once 11/24/14 1901 11/24/14 2148   11/24/14 1915  vancomycin (VANCOCIN) IVPB 1000 mg/200 mL premix     1,000 mg 200 mL/hr over 60 Minutes Intravenous  Once 11/24/14 1901 11/24/14 2219      Subjective:   [name] continues to have nausea, abdominal pain improved.   Objective:    Filed Vitals:   11/28/14 0400 11/28/14 0416 11/28/14 0600 11/28/14 0800  BP: 145/95  126/99   Pulse: 97  102 109  Temp: 98.5 F (36.9 C)   98.5 F (36.9 C)  TempSrc: Oral   Oral  Resp: 12  28 16   Height:      Weight:  55.3 kg (121 lb 14.6 oz)    SpO2: 99%  99% 100%    Intake/Output Summary (Last 24 hours) at 11/28/14 0919 Last data filed at 11/28/14 0600  Gross per 24 hour  Intake 2342.34 ml  Output   4090 ml  Net -1747.66 ml   Filed Weights   11/24/14 1200 11/25/14 0500 11/28/14 0416  Weight: 51.1 kg (112 lb 10.5 oz) 57.4 kg (126 lb 8.7 oz) 55.3 kg (121 lb 14.6 oz)    Exam: Gen:  NAD Cardiovascular:  Tachy, No M/R/G Respiratory:  Lungs CTAB Gastrointestinal:  Abdomen soft, NT/ND, + BS Extremities:  1+ edema   Data Reviewed:    Labs: Basic Metabolic Panel:  Recent Labs Lab 11/24/14 1416  11/25/14 0624  11/25/14 2116 11/26/14 0126 11/26/14 0512 11/26/14 1305 11/27/14 0340 11/28/14 0337    NA 145  < >  --   < > 139 138  --  139 141 139  K 4.8  < >  --   < > 2.3* 2.6*  --  2.8* 3.7 3.5  CL 115*  < >  --   < > 108 104  --  106 109 106  CO2 7*  < >  --   < > 20* 21*  --  20* 25 27  GLUCOSE 327*  < >  --   < > 144* 168*  --  181* 113* 116*  BUN 12  < >  --   < > <5* <5*  --  <5* <5* <5*  CREATININE 1.10*  < >  --   < > 0.62 0.61  --  0.72 0.45 0.49  CALCIUM 8.7*  < >  --   < > 7.2* 7.3*  --  7.3* 7.6* 8.5*  MG 2.0  --  1.5*  --   --   --  1.5*  --  1.9 1.9  PHOS 3.7  --  1.9*  --   --   --  <1.1*  --  1.8* 2.2*  < > = values in this interval not displayed. GFR Estimated Creatinine Clearance: 92.8 mL/min (by C-G formula based on Cr of 0.49). Liver Function Tests:  Recent Labs Lab 11/24/14 0947 11/25/14 0346 11/26/14 0512  AST 84* 51* 70*  ALT 58* 36 37  ALKPHOS 147* 102 92  BILITOT 2.7* 0.8 0.6  PROT 8.2* 6.0* 5.2*  ALBUMIN 4.4 3.1* 2.6*    Recent Labs Lab 11/24/14 0950 11/25/14 0624 11/25/14 2116 11/26/14 0512  LIPASE 30 386* 343* 270*  AMYLASE  --  378* 392*  --    Coagulation profile  Recent Labs Lab 11/24/14 1919  INR 1.31    CBC:  Recent Labs Lab 11/24/14 0947  11/24/14 1416 11/25/14 0624 11/25/14 2116 11/26/14 0512 11/28/14 0337  WBC 10.4  --  17.5* 10.7* 7.6 8.2 4.3  NEUTROABS 6.8  --   --   --  5.5  --   --   HGB 14.8  < > 13.8 13.8 11.2* 11.4* 10.8*  HCT 44.8  < > 44.7 43.2 33.6* 33.8* 32.4*  MCV 99.6  --  104.0* 100.7* 94.6 95.8 96.4  PLT 413*  --  444* 317 281 295 267  < > = values in this interval not displayed. Cardiac Enzymes:  Recent Labs Lab 11/25/14 0624 11/25/14 1330 11/25/14 2116  CKTOTAL 41  --   --   CKMB 5.1*  --   --   TROPONINI <0.03 <0.03 <0.03   CBG:  Recent Labs Lab 11/27/14 1936 11/27/14 2135 11/28/14 0005 11/28/14 0405 11/28/14 0718  GLUCAP 140* 118* 129* 113* 197*   Sepsis Labs:  Recent Labs Lab 11/25/14 0346 11/25/14 0624  11/25/14 2116 11/26/14 0126 11/26/14 0512 11/27/14 0340  11/28/14 0337  PROCALCITON 0.15 0.17  --   --   --  0.15 0.18  --   WBC  --  10.7*  --  7.6  --  8.2  --  4.3  LATICACIDVEN 4.7*  --   < > 7.5* 7.3*  --  4.6* 3.2*  < > =  values in this interval not displayed. Microbiology Recent Results (from the past 240 hour(s))  MRSA PCR Screening     Status: None   Collection Time: 11/24/14 12:00 PM  Result Value Ref Range Status   MRSA by PCR NEGATIVE NEGATIVE Final    Comment:        The GeneXpert MRSA Assay (FDA approved for NASAL specimens only), is one component of a comprehensive MRSA colonization surveillance program. It is not intended to diagnose MRSA infection nor to guide or monitor treatment for MRSA infections.   Culture, blood (routine x 2)     Status: None (Preliminary result)   Collection Time: 11/24/14  8:15 PM  Result Value Ref Range Status   Specimen Description BLOOD LEFT ARM  Final   Special Requests   Final    BOTTLES DRAWN AEROBIC AND ANAEROBIC [place] BOTH BOTTLES   Culture   Final    NO GROWTH 2 DAYS Performed at [facility]    Report Status PENDING  Incomplete  Culture, blood (routine x 2)     Status: None (Preliminary result)   Collection Time: 11/24/14  8:20 PM  Result Value Ref Range Status   Specimen Description BLOOD RIGHT HAND  Final   Special Requests BOTTLES DRAWN AEROBIC ONLY 10CC  Final   Culture   Final    NO GROWTH 2 DAYS Performed at [facility]    Report Status PENDING  Incomplete     Medications:   . antiseptic oral rinse  7 mL Mouth Rinse q12n4p  . budesonide-formoterol  2 puff Inhalation BID  . chlorhexidine  15 mL Mouth Rinse BID  . enoxaparin (LOVENOX) injection  40 mg Subcutaneous Daily  . insulin aspart  0-15 Units Subcutaneous 6 times per day  . insulin glargine  40 Units Subcutaneous Q24H  . montelukast  10 mg Oral Daily  . piperacillin-tazobactam (ZOSYN)  IV  3.375 g Intravenous Q8H  . potassium phosphate IVPB (mmol)  30 mmol Intravenous Once   Continuous  Infusions: . sodium chloride 10 mL/hr at 11/28/14 0407  . dextrose 5 % and 0.9 % NaCl with KCl 40 mEq/L 100 mL/hr at 11/28/14 0700    Time spent: 25  Minutes.     LOS: 4 days   [place] [facility] Pager [phone]   *Please refer to [email or web address], password [email or web address] to get updated schedule on who will round on this patient, as hospitalists switch teams weekly. If 7PM-7AM, please contact night-coverage at [email or web address], password [email or web address] for any overnight needs.  11/28/2014, 9:19 AM

## 2014-11-28 NOTE — Progress Notes (Signed)
Subjective: Minimal abdominal pain. Still nausea and vomiting; receiving ondansetron.  Objective: Vital signs in last 24 hours: Temp:  [98.5 F (36.9 C)-99 F (37.2 C)] 98.5 F (36.9 C) (11/09 0800) Pulse Rate:  [95-109] 109 (11/09 0800) Resp:  [12-28] 16 (11/09 0800) BP: (126-148)/(87-117) 126/99 mmHg (11/09 0600) SpO2:  [99 %-100 %] 100 % (11/09 0800) Weight:  [55.3 kg (121 lb 14.6 oz)-57.153 kg (126 lb)] 55.3 kg (121 lb 14.6 oz) (11/09 0416) Weight change:  Last BM Date: 11/27/14  PE: GEN:  NAD NEURO:  Alert and oriented, flat affect  Lab Results: CBC    Component Value Date/Time   WBC 4.3 11/28/2014 0337   RBC 3.36* 11/28/2014 0337   HGB 10.8* 11/28/2014 0337   HCT 32.4* 11/28/2014 0337   PLT 267 11/28/2014 0337   MCV 96.4 11/28/2014 0337   MCH 32.1 11/28/2014 0337   MCHC 33.3 11/28/2014 0337   RDW 13.7 11/28/2014 0337   LYMPHSABS 1.1 11/25/2014 2116   MONOABS 0.9 11/25/2014 2116   EOSABS 0.1 11/25/2014 2116   BASOSABS 0.0 11/25/2014 2116   CMP     Component Value Date/Time   NA 139 11/28/2014 0337   K 3.5 11/28/2014 0337   CL 106 11/28/2014 0337   CO2 27 11/28/2014 0337   GLUCOSE 116* 11/28/2014 0337   BUN <5* 11/28/2014 0337   CREATININE 0.49 11/28/2014 0337   CALCIUM 8.5* 11/28/2014 0337   PROT 5.2* 11/26/2014 0512   ALBUMIN 2.6* 11/26/2014 0512   AST 70* 11/26/2014 0512   ALT 37 11/26/2014 0512   ALKPHOS 92 11/26/2014 0512   BILITOT 0.6 11/26/2014 0512   GFRNONAA >60 11/28/2014 0337   GFRAA >60 11/28/2014 9169   Studies/Results: MRI/MRCP:  Few benign-appearing liver adenomas; mild peripancreatic inflammation especially around pancreatic head; no biliary ductal dilatation; no choledocholithiasis seen.  Assessment:  1. Pancreatitis. She denies significant abdominal pain at this time.  Possibly gallstone-mediated.  No choledocholithiasis or biliary ductal dilatation identified at this time. 2. Gallstones. 3. 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 4. Liver lesions, look like adenomas on MRI.  Plan:  1.  Continue ondansetron as-needed. 2.  Advance diet (full liquids). 3.  If nausea and vomiting persists over the next couple days, would consider HIDA scan; diabetes is under control at this time; thus doubt DKA or hyperglycemia-induced nausea and vomiting. 4.  Will follow.   Landry Dyke 11/28/2014, 1:38 PM   Pager [phone] If no answer or after 5 PM call [phone]

## 2014-11-29 LAB — GLUCOSE, CAPILLARY
GLUCOSE-CAPILLARY: 78 mg/dL (ref 65–99)
GLUCOSE-CAPILLARY: 82 mg/dL (ref 65–99)
Glucose-Capillary: 107 mg/dL — ABNORMAL HIGH (ref 65–99)
Glucose-Capillary: 47 mg/dL — ABNORMAL LOW (ref 65–99)
Glucose-Capillary: 64 mg/dL — ABNORMAL LOW (ref 65–99)
Glucose-Capillary: 180 mg/dL — ABNORMAL HIGH (ref 65–99)

## 2014-11-29 LAB — PHOSPHORUS: Phosphorus: 3.9 mg/dL (ref 2.5–4.6)

## 2014-11-29 LAB — BASIC METABOLIC PANEL
Anion gap: 5 (ref 5–15)
BUN: 6 mg/dL (ref 6–20)
CALCIUM: 8.6 mg/dL — AB (ref 8.9–10.3)
CHLORIDE: 107 mmol/L (ref 101–111)
CO2: 25 mmol/L (ref 22–32)
CREATININE: 0.51 mg/dL (ref 0.44–1.00)
GFR calc Af Amer: >60 mL/min (ref >60)
GFR calc non Af Amer: >60 mL/min (ref >60)
Glucose, Bld: 86 mg/dL (ref 65–99)
Potassium: 3.5 mmol/L (ref 3.5–5.1)
SODIUM: 137 mmol/L (ref 135–145)

## 2014-11-29 LAB — LACTIC ACID, PLASMA: LACTIC ACID, VENOUS: 2.3 mmol/L — AB (ref 0.5–2.0)

## 2014-11-29 MED ORDER — INSULIN GLARGINE 100 UNIT/ML ~~LOC~~ SOLN: 38.0000 [IU] | SUBCUTANEOUS | Status: DC

## 2014-11-29 MED ORDER — ONDANSETRON HCL 4 MG PO TABS: 4.0000 mg | ORAL_TABLET | Freq: Three times a day (TID) | ORAL | Status: DC | PRN

## 2014-11-29 MED ORDER — INSULIN ASPART 100 UNIT/ML ~~LOC~~ SOLN: 0.0000 [IU] | Freq: Every day | SUBCUTANEOUS | Status: DC

## 2014-11-29 MED ORDER — INSULIN ASPART 100 UNIT/ML ~~LOC~~ SOLN: 0.0000 [IU] | Freq: Three times a day (TID) | SUBCUTANEOUS | Status: DC

## 2014-11-29 NOTE — Progress Notes (Signed)
  Date: November10, 2016 Chart reviewed for concurrent status and case management needs. Will continue to follow patient for changes and needs: Velva Harman, RN, BSN, [place]   [phone]

## 2014-11-29 NOTE — Progress Notes (Signed)
Pt discharged from the unit. Pt wanted to ambulate to the car. RN and CNA offered wheelchair to downstairs. AVS was given to the pt and explained about upcoming MD appointments. Pt has no new questions at discharge. Andalyn Heckstall W Oney Tatlock, RN

## 2014-11-29 NOTE — Progress Notes (Signed)
Previous CBG at 2344; CBG: 127 and 2 units of insulin were administered at 0005. At 0405 CBG: 47. Patient stated she is not experiencing jitteriness, dizziness, nor lightheadedness. Patient is alert and oriented at this time. Hypoglycemia protocol initiated. Patient had 118 mL of orange juice. Repeat CBG obtained at 044; CBG: 64. Another 118 mL of orange juice given. Repeat CBG obtained at 0516; CBG: 78.

## 2014-11-29 NOTE — Progress Notes (Addendum)
Inpatient Diabetes Program Recommendations  AACE/ADA: New Consensus Statement on Inpatient Glycemic Control (2015)  Target Ranges:  Prepandial:   less than 140 mg/dL      Peak postprandial:   less than 180 mg/dL (1-2 hours)      Critically ill patients:  140 - 180 mg/dL    Results for [name], [name] (MRN [id number]) as of 11/29/2014 09:22  Ref. Range 11/28/2014 23:44 11/29/2014 04:04 11/29/2014 04:40 11/29/2014 05:16 11/29/2014 07:44  Glucose-Capillary Latest Ref Range: 65-99 mg/dL 127 (H) 47 (L) 64 (L) 78 82    Admit with: Migraines and DKA  History: Type 1 DM, HTN  Home DM Meds: Lantus 36 units QHS  Novolog QID per SSI regimen  Current Insulin Orders: Lantus 40 units daily      Novolog Moderate SSI (0-15 units) Q4 hours      MD- Patient with Hypoglycemia this morning- CBG 47 mg/dl at 4am.  Please consider the following in-hospital insulin adjustments:  1. Reduce Lantus to home dose of 36 units daily  2. Change Novolog Moderate SSI to TID AC + HS (currently ordered as Q4 hours)     --Will follow patient during hospitalization--  Wyn Quaker RN, MSN, CDE Diabetes Coordinator Inpatient Glycemic Control Team Team Pager: [phone] (8a-5p)

## 2014-11-29 NOTE — Progress Notes (Signed)
Subjective: No abdominal pain. No nausea/vomiting. Tolerating diet.  Objective: Vital signs in last 24 hours: Temp:  [97.3 F (36.3 C)-98.8 F (37.1 C)] 97.8 F (36.6 C) (11/10 0852) Pulse Rate:  [91-104] 91 (11/10 0852) Resp:  [18] 18 (11/10 0852) BP: (117-150)/(70-105) 118/89 mmHg (11/10 0852) SpO2:  [99 %-100 %] 100 % (11/10 0852) Weight change:  Last BM Date: 11/27/14  PE: GEN:  NAD ABD:  Soft  Lab Results: CBC    Component Value Date/Time   WBC 4.3 11/28/2014 0337   RBC 3.36* 11/28/2014 0337   HGB 10.8* 11/28/2014 0337   HCT 32.4* 11/28/2014 0337   PLT 267 11/28/2014 0337   MCV 96.4 11/28/2014 0337   MCH 32.1 11/28/2014 0337   MCHC 33.3 11/28/2014 0337   RDW 13.7 11/28/2014 0337   LYMPHSABS 1.1 11/25/2014 2116   MONOABS 0.9 11/25/2014 2116   EOSABS 0.1 11/25/2014 2116   BASOSABS 0.0 11/25/2014 2116   CMP     Component Value Date/Time   NA 137 11/29/2014 0510   K 3.5 11/29/2014 0510   CL 107 11/29/2014 0510   CO2 25 11/29/2014 0510   GLUCOSE 86 11/29/2014 0510   BUN 6 11/29/2014 0510   CREATININE 0.51 11/29/2014 0510   CALCIUM 8.6* 11/29/2014 0510   PROT 5.2* 11/26/2014 0512   ALBUMIN 2.6* 11/26/2014 0512   AST 70* 11/26/2014 0512   ALT 37 11/26/2014 0512   ALKPHOS 92 11/26/2014 0512   BILITOT 0.6 11/26/2014 0512   GFRNONAA >60 11/29/2014 0510   GFRAA >60 11/29/2014 0510   Assessment:  1.  Nausea and vomiting, resolved.  DKA etiology most likely.  Probably also has gastroparesis component.  However, could have component of gallbladder dysfunction. 2.  Gallstones.  Unclear significance.  Unclear if causing some nausea/vomiting symptoms. 3.  Pancreatitis.  Unclear etiology, but could be gallstone-mediated, though no choledocholithiasis or biliary ductal dilatation on recent MRCP. 4.  Liver lesions, suspect benign adenomas on MRI, she is not on OCP's. 5.  Anemia, no overt GI bleeding, suspect dilutional.  Plan:  1.  PPI upon discharge (Protonix  40 mg po qd). 2.  Gastroparesis-type diet (small, frequent meals; avoid raw fruits/vegetables, avoid exceedingly rich or spicy foods). 3.  Ondansetron prn for nausea/vomiting. 4.  Follow-up with Dr. Paulita Fujita in the next couple weeks. 5.  Will sign-off; please call with questions; thank you for the consultation.   Landry Dyke 11/29/2014, 1:20 PM   Pager [phone] If no answer or after 5 PM call [phone]

## 2014-11-30 LAB — CULTURE, BLOOD (ROUTINE X 2)
CULTURE: NO GROWTH
Culture: NO GROWTH

## 2014-11-30 NOTE — Discharge Summary (Signed)
Physician Discharge Summary  [name] [id number] DOB: 11/08/1990 DOA: 11/24/2014  PCP: Gaynelle Cage A, PA-C  Admit date: 11/24/2014 Discharge date: 11/29/2014  Time spent: 25  minutes  Recommendations for Outpatient Follow-up:  1. Follow up with MRCP in 6 months to follow the liver lesions 2. Follow upw ith Dr Paulita Fujita as recommended.  3. Please follow up with PCP in one week.   Discharge Diagnoses:  Principal Problem:   DKA, type 1 ([place]) Active Problems:   Uncontrolled type 1 diabetes mellitus (HCC)   Asthma   Elevated LFTs   Acute kidney injury ([place])   Migraine headache   Sepsis ([place])   Sinus tachycardia (HCC)   Hypomagnesemia   Hypophosphatemia   Elevated amylase and lipase   Pancreatitis, acute   Volume overload   Hypokalemia   Hepatic steatosis   Liver mass   Nausea and vomiting   Discharge Condition: improved  Diet recommendation: carb modified diet.   Filed Weights   11/24/14 1200 11/25/14 0500 11/28/14 0416  Weight: 51.1 kg (112 lb 10.5 oz) 57.4 kg (126 lb 8.7 oz) 55.3 kg (121 lb 14.6 oz)    History of present illness:  [name] is an 24 y.o. female with a PMH of type 1 diabetes, hypertension, asthma, and migraine headaches who was admitted 11/24/14 with a 2 week history of migraine headaches and DKA, was found to have pancreatitis of unclear etiology. MRCP did not re veal a pancreatic division or any choledocholithiasis.  Hospital Course:     - DM coordinator & dietician consultations performed. Started  onclear liquid diet, advanced diet to regular diet.   Active Problems:  Volume overload in the setting of aggressive fluid volume resuscitation - Given Lasix 11/25/14. Respiratory status stable.   Acute pancreatitis with elevated lipase and amylase, intractable nausea and vomiting - Abdominal ultrasound unrevealing. - CT of the abdomen and pelvis done 11/25/14: Volume overload, diffuse retroperitoneal edema, pancreatitis. -  Symptoms resolved. - GI consultation requested. Recommended MRCP for evaluation of pancreatic division and choledocholithiasis, which were negative, showed some benign hepatic adenomas ,recommend follow up MRI in 6 months.     Sinus tachycardia - possibly from pancreatitis and DKA, improving. Asymptomatic.    Hypomagnesemia/hypophosphatemia/hypokalemia - Monitor and replace electrolytes as needed.   Asthma - Continue Symbicort, Singulair and bronchodilators as needed.   Elevated LFTs/hepatic steatosis/liver mass - Monitor. Abdominal ultrasound shows hepatic steatosis. LFTs have improved with hydration. - Tylenol level not elevated. - Liver mass noted on CT abdomen, , MRI of the abdomen showed benign hepatic adenomas, recommended follow up MRI with and without contrast in 6 months.    Acute kidney injury ([place]) - Secondary to osmotic diuresis. Creatinine normalized with IV fluids.   Migraine headache - Toradol ordered as needed.       Procedures:  MRI of the abdomen.   Consultations:  gastroenterology Discharge Exam: Filed Vitals:   11/29/14 1412  BP: 100/64  Pulse: 106  Temp: 98.3 F (36.8 C)  Resp: 18    General: alert afebrile comfortable Cardiovascular: s1s2 Respiratory: ctab  Discharge Instructions   Discharge Instructions    Diet Carb Modified    Complete by:  As directed      Discharge instructions    Complete by:  As directed   Follow up with gastroenterology Dr Paulita Fujita as recommended.          Discharge Medication List as of 11/29/2014  3:43 PM    START taking these medications  Details  ondansetron (ZOFRAN) 4 MG tablet Take 1 tablet (4 mg total) by mouth every 8 (eight) hours as needed for nausea or vomiting., Starting 11/29/2014, Until Discontinued, Print      CONTINUE these medications which have NOT CHANGED   Details  albuterol (PROVENTIL HFA;VENTOLIN HFA) 108 (90 BASE) MCG/ACT inhaler Inhale 2 puffs into the lungs every 6 (six)  hours as needed for shortness of breath., Until Discontinued, Historical Med    albuterol (PROVENTIL) (2.5 MG/3ML) 0.083% nebulizer solution Take 2.5 mg by nebulization every 6 (six) hours as needed for shortness of breath., Until Discontinued, Historical Med    budesonide-formoterol (SYMBICORT) 160-4.5 MCG/ACT inhaler Inhale 2 puffs into the lungs 2 (two) times daily., Until Discontinued, Historical Med    EPINEPHrine (EPIPEN) 0.3 mg/0.3 mL SOAJ injection Inject 0.3 mg into the muscle once., Historical Med    glucagon 1 MG injection Inject 1 mg into the muscle as needed. FOR LOW BLOOD SUGAR, Starting 01/31/2014, Until Thu 01/31/15, Historical Med    insulin aspart (NOVOLOG FLEXPEN) 100 UNIT/ML FlexPen INJECT 0-20 UNITS INTO THE SKIN AS DIRECTED. SSI - CARB SCALE, 0-20 UNITS, Normal    Insulin Glargine (LANTUS SOLOSTAR) 100 UNIT/ML Solostar Pen Inject 36 Units into the skin daily at 10 pm., Starting 10/19/2013, Until Discontinued, Normal    montelukast (SINGULAIR) 10 MG tablet Take 10 mg by mouth daily., Until Discontinued, Historical Med    pantoprazole (PROTONIX) 40 MG tablet Take 1 tablet (40 mg total) by mouth daily., Starting 09/21/2014, Until Discontinued, Normal       Allergies  Allergen Reactions  . Peanut-Containing Drug Products Swelling    Swelling of mouth, lips  . Strawberry Extract Swelling    Swelling of mouth, lips   Follow-up Information    Follow up with MICHAELS,CHASE A, PA-C. Schedule an appointment as soon as possible for a visit in 1 week.   Specialty:  General Practice   Contact information:   [place] [address] [phone]       Follow up with Landry Dyke, MD. Schedule an appointment as soon as possible for a visit in 4 weeks.   Specialty:  Gastroenterology   Contact information:   [id number] [place] [address] [phone]        The results of significant diagnostics from this hospitalization  (including imaging, microbiology, ancillary and laboratory) are listed below for reference.    Significant Diagnostic Studies: US Abdomen Complete  11/25/2014  CLINICAL DATA:  History of diabetic ketoacidosis and acute renal failure. EXAM: ULTRASOUND ABDOMEN COMPLETE COMPARISON:  Right upper quadrant abdominal ultrasound - 09/21/2014; abdominal ultrasound - 02/27/2014; abdominal radiograph -11/25/2014 FINDINGS: Gallbladder: Sonographically normal. No echogenic gallstones or gall sludge. No gallbladder wall thickening or pericholecystic fluid. Negative sonographic Murphy's sign. Common bile duct:  Normal in size measuring 2.7 mm in diameter Liver: There is mild diffuse increased echogenicity of the hepatic parenchyma. There is an approximately 2.4 x 3.1 x 2.5 cm nearly isoechoic ill-defined nodule within the right lobe of the liver (image 29) as well as an additional isoechoic approximately 2.0 x 1.4 x 2.3 cm nodule within the central aspect of the right lobe of the liver (image 33), both of which appear unchanged since the 09/2014 examination. No new discrete hepatic lesions. No intrahepatic biliary duct dilatation. No ascites. IVC: No abnormality visualized. Pancreas: Limited visualization of the pancreatic head and neck is normal. Visualization of the pancreatic body and tail is obscured by  bowel gas. Spleen: Normal in size measuring 5.3 cm in length Right Kidney: Normal cortical thickness, echogenicity and size, measuring 12.5 cm in length. No focal renal lesions. No echogenic renal stones. No urinary obstruction. Left Kidney: Normal cortical thickness, echogenicity and size, measuring 11.7 cm in length. No focal renal lesions. No echogenic renal stones. No urinary obstruction. Abdominal aorta: No aneurysm visualized. Other findings: None. IMPRESSION: 1. No definite explanation for patient's acute renal failure. Specifically, no evidence of urinary obstruction. 2. Similar findings of suspected hepatic  steatosis. 3. Ill-defined nearly isoechoic nodules within the right lobe of the liver appear grossly unchanged since the 09/2014 examination and while technically indeterminate, in the absence of a known primary malignancy, these nodules are favored to be benign in etiology potentially representative of hemangiomas or FNH. Further evaluation with nonemergent abdominal MRI could be performed as clinically indicated. Electronically Signed   By: Sandi Mariscal M.D.   On: 11/25/2014 09:05   Ct Abdomen Pelvis W Contrast  11/25/2014  CLINICAL DATA:  Abdominal pain and abnormal liver function tests after recent DKA. EXAM: CT ABDOMEN AND PELVIS WITH CONTRAST TECHNIQUE: Multidetector CT imaging of the abdomen and pelvis was performed using the standard protocol following bolus administration of intravenous contrast. CONTRAST:  148mL OMNIPAQUE IOHEXOL 300 MG/ML  SOLN COMPARISON:  None. FINDINGS: Lower chest and abdominal wall: Small right pleural effusion which is layering. Lung opacity at the right base has a linear morphology favoring atelectasis. No definitive pneumonia. Hepatobiliary: Large (22 cm craniocaudal span) and steatotic appearing liver. There is a lobulated mass in segment 5 measuring up to 27 mm, size stable compared to 09/21/2014 sonography. Other masses on prior sonography are not visualized on this single phase study. Liver lesions on previous ultrasound had a indeterminate appearance with hypoechoic halos. Cholelithiasis. No evidence of acute cholecystitis. Pancreas: Diffuse retroperitoneal edema without notable pancreas expansion. No ductal enlargement. Spleen: Unremarkable. Adrenals/Urinary Tract: Negative adrenals. No hydronephrosis or stone. Dilated bladder without wall thickening. Reproductive:No pathologic findings. Stomach/Bowel:  No obstruction. Vascular/Lymphatic: No acute vascular abnormality. No mass or adenopathy. Peritoneal: No ascites or pneumoperitoneum. Musculoskeletal: No acute  abnormalities. IMPRESSION: 1. Retroperitoneal edema partially attributed to pancreatitis given serum enzymes. 2. Volume overload with small right pleural effusion, ascites, and retroperitoneal edema. 3. 27 mm segment 5 liver mass. Additional masses seen by sonography 09/21/2014 are not visualized today. When the patient is clinically stable and able to follow directions and hold their breath (preferably as an outpatient) further evaluation with liver MRI recommended for definitive characterization. 4. Hepatomegaly and steatosis. 5. Cholelithiasis. Electronically Signed   By: Monte Fantasia M.D.   On: 11/25/2014 23:41   Mr 3d Recon At Scanner  11/27/2014  CLINICAL DATA:  Pancreatitis, gallstones, liver lesion on CT EXAM: MRI ABDOMEN WITHOUT AND WITH CONTRAST (INCLUDING MRCP) TECHNIQUE: Multiplanar multisequence MR imaging of the abdomen was performed both before and after the administration of intravenous contrast. Heavily T2-weighted images of the biliary and pancreatic ducts were obtained, and three-dimensional MRCP images were rendered by post processing. CONTRAST:  64mL MULTIHANCE GADOBENATE DIMEGLUMINE 529 MG/ML IV SOLN COMPARISON:  CT abdomen pelvis dated 11/25/2014 FINDINGS: Lower chest: Small bilateral pleural effusions, right greater than left. Hepatobiliary: Moderate hepatic steatosis. At least four liver lesions are present, best visualized on the postcontrast subtraction images (series 11301): --1.8 x 2.7 cm lesion in segment 5 (image 70), with intracellular lipid, corresponding to the CT abnormality --12 mm lesion in segment 7 (image 47), with intracellular lipid --11 mm  lesion in segment 8 (image 32) --7 mm lesion in segment 2 (image 31) Overall, these are favored to reflect hepatic adenomas. [place] is considered less likely given the lack of hypervascularity on CT. The enhancement pattern is not characteristic for hemangiomas. Layering tiny gallstones (series 12/ image 39). No associated inflammatory  changes. No intrahepatic or extrahepatic ductal dilatation. Common duct measures 3 mm. No choledocholithiasis is seen. Pancreas: Mild peripancreatic inflammatory changes with prominence of the pancreatic head, likely reflecting acute pancreatitis. No pancreatic ductal dilatation. No evidence of pancreatic divisum. No peripancreatic pseudocyst. Spleen: Within normal limits. Adrenals/Urinary Tract: Adrenal glands are within normal limits. Kidneys are within normal limits.  No hydronephrosis. Stomach/Bowel: Stomach and visualized bowel are unremarkable. Vascular/Lymphatic: No evidence of abdominal aortic aneurysm. No suspicious abdominal lymphadenopathy. Other: No abdominal ascites. Musculoskeletal: No focal osseous lesions. IMPRESSION: No evidence of pancreatic divisum. Layering tiny gallstones. No intrahepatic or extrahepatic ductal dilatation. Common duct measures 3 mm. No choledocholithiasis is seen. Four enhancing hepatic lesions, measuring up to 2.7 cm in segment 5, favored to reflect benign hepatic adenomas. Consider follow-up MRI abdomen with/without contrast in 6 months, ideally with Eovist contrast. If this patient is on OCPs, consider withdrawal prior to repeat imaging. Electronically Signed   By: Julian Hy M.D.   On: 11/27/2014 16:03   Dg Chest Portable 1 View  11/24/2014  CLINICAL DATA:  Shortness of breath.  Pain EXAM: PORTABLE CHEST 1 VIEW COMPARISON:  09/18/2014 FINDINGS: The heart size and mediastinal contours are within normal limits. Both lungs are clear. The visualized skeletal structures are unremarkable. IMPRESSION: No active disease. Electronically Signed   By: Kerby Moors M.D.   On: 11/24/2014 10:11   Dg Abd Portable 1v  11/25/2014  CLINICAL DATA:  Generalized abdominal pain EXAM: PORTABLE ABDOMEN - 1 VIEW COMPARISON:  None. FINDINGS: The bowel gas pattern is normal. No radio-opaque calculi or other significant radiographic abnormality are seen. IMPRESSION: Negative.  Electronically Signed   By: Andreas Newport M.D.   On: 11/25/2014 05:44   Mr Abd W/wo Cm/mrcp  11/27/2014  CLINICAL DATA:  Pancreatitis, gallstones, liver lesion on CT EXAM: MRI ABDOMEN WITHOUT AND WITH CONTRAST (INCLUDING MRCP) TECHNIQUE: Multiplanar multisequence MR imaging of the abdomen was performed both before and after the administration of intravenous contrast. Heavily T2-weighted images of the biliary and pancreatic ducts were obtained, and three-dimensional MRCP images were rendered by post processing. CONTRAST:  40mL MULTIHANCE GADOBENATE DIMEGLUMINE 529 MG/ML IV SOLN COMPARISON:  CT abdomen pelvis dated 11/25/2014 FINDINGS: Lower chest: Small bilateral pleural effusions, right greater than left. Hepatobiliary: Moderate hepatic steatosis. At least four liver lesions are present, best visualized on the postcontrast subtraction images (series 11301): --1.8 x 2.7 cm lesion in segment 5 (image 70), with intracellular lipid, corresponding to the CT abnormality --12 mm lesion in segment 7 (image 47), with intracellular lipid --11 mm lesion in segment 8 (image 32) --7 mm lesion in segment 2 (image 31) Overall, these are favored to reflect hepatic adenomas. [place] is considered less likely given the lack of hypervascularity on CT. The enhancement pattern is not characteristic for hemangiomas. Layering tiny gallstones (series 12/ image 39). No associated inflammatory changes. No intrahepatic or extrahepatic ductal dilatation. Common duct measures 3 mm. No choledocholithiasis is seen. Pancreas: Mild peripancreatic inflammatory changes with prominence of the pancreatic head, likely reflecting acute pancreatitis. No pancreatic ductal dilatation. No evidence of pancreatic divisum. No peripancreatic pseudocyst. Spleen: Within normal limits. Adrenals/Urinary Tract: Adrenal glands are within normal limits. Kidneys  are within normal limits.  No hydronephrosis. Stomach/Bowel: Stomach and visualized bowel are  unremarkable. Vascular/Lymphatic: No evidence of abdominal aortic aneurysm. No suspicious abdominal lymphadenopathy. Other: No abdominal ascites. Musculoskeletal: No focal osseous lesions. IMPRESSION: No evidence of pancreatic divisum. Layering tiny gallstones. No intrahepatic or extrahepatic ductal dilatation. Common duct measures 3 mm. No choledocholithiasis is seen. Four enhancing hepatic lesions, measuring up to 2.7 cm in segment 5, favored to reflect benign hepatic adenomas. Consider follow-up MRI abdomen with/without contrast in 6 months, ideally with Eovist contrast. If this patient is on OCPs, consider withdrawal prior to repeat imaging. Electronically Signed   By: Julian Hy M.D.   On: 11/27/2014 16:03    Microbiology: Recent Results (from the past 240 hour(s))  MRSA PCR Screening     Status: None   Collection Time: 11/24/14 12:00 PM  Result Value Ref Range Status   MRSA by PCR NEGATIVE NEGATIVE Final    Comment:        The GeneXpert MRSA Assay (FDA approved for NASAL specimens only), is one component of a comprehensive MRSA colonization surveillance program. It is not intended to diagnose MRSA infection nor to guide or monitor treatment for MRSA infections.   Culture, blood (routine x 2)     Status: None (Preliminary result)   Collection Time: 11/24/14  8:15 PM  Result Value Ref Range Status   Specimen Description BLOOD LEFT ARM  Final   Special Requests   Final    BOTTLES DRAWN AEROBIC AND ANAEROBIC [place] BOTH BOTTLES   Culture   Final    NO GROWTH 4 DAYS Performed at [facility]    Report Status PENDING  Incomplete  Culture, blood (routine x 2)     Status: None (Preliminary result)   Collection Time: 11/24/14  8:20 PM  Result Value Ref Range Status   Specimen Description BLOOD RIGHT HAND  Final   Special Requests BOTTLES DRAWN AEROBIC ONLY 10CC  Final   Culture   Final    NO GROWTH 4 DAYS Performed at [facility]    Report Status PENDING   Incomplete     Labs: Basic Metabolic Panel:  Recent Labs Lab 11/24/14 1416  11/25/14 0624  11/26/14 0126 11/26/14 0512 11/26/14 1305 11/27/14 0340 11/28/14 0337 11/29/14 0510  NA 145  < >  --   < > 138  --  139 141 139 137  K 4.8  < >  --   < > 2.6*  --  2.8* 3.7 3.5 3.5  CL 115*  < >  --   < > 104  --  106 109 106 107  CO2 7*  < >  --   < > 21*  --  20* 25 27 25   GLUCOSE 327*  < >  --   < > 168*  --  181* 113* 116* 86  BUN 12  < >  --   < > <5*  --  <5* <5* <5* 6  CREATININE 1.10*  < >  --   < > 0.61  --  0.72 0.45 0.49 0.51  CALCIUM 8.7*  < >  --   < > 7.3*  --  7.3* 7.6* 8.5* 8.6*  MG 2.0  --  1.5*  --   --  1.5*  --  1.9 1.9  --   PHOS 3.7  --  1.9*  --   --  <1.1*  --  1.8* 2.2* 3.9  < > = values  in this interval not displayed. Liver Function Tests:  Recent Labs Lab 11/24/14 0947 11/25/14 0346 11/26/14 0512  AST 84* 51* 70*  ALT 58* 36 37  ALKPHOS 147* 102 92  BILITOT 2.7* 0.8 0.6  PROT 8.2* 6.0* 5.2*  ALBUMIN 4.4 3.1* 2.6*    Recent Labs Lab 11/24/14 0950 11/25/14 0624 11/25/14 2116 11/26/14 0512  LIPASE 30 386* 343* 270*  AMYLASE  --  378* 392*  --    No results for input(s): AMMONIA in the last 168 hours. CBC:  Recent Labs Lab 11/24/14 0947  11/24/14 1416 11/25/14 0624 11/25/14 2116 11/26/14 0512 11/28/14 0337  WBC 10.4  --  17.5* 10.7* 7.6 8.2 4.3  NEUTROABS 6.8  --   --   --  5.5  --   --   HGB 14.8  < > 13.8 13.8 11.2* 11.4* 10.8*  HCT 44.8  < > 44.7 43.2 33.6* 33.8* 32.4*  MCV 99.6  --  104.0* 100.7* 94.6 95.8 96.4  PLT 413*  --  444* 317 281 295 267  < > = values in this interval not displayed. Cardiac Enzymes:  Recent Labs Lab 11/25/14 0624 11/25/14 1330 11/25/14 2116  CKTOTAL 41  --   --   CKMB 5.1*  --   --   TROPONINI <0.03 <0.03 <0.03   BNP: BNP (last 3 results) No results for input(s): BNP in the last 8760 hours.  ProBNP (last 3 results) No results for input(s): PROBNP in the last 8760 hours.  CBG:  Recent  Labs Lab 11/29/14 0404 11/29/14 0440 11/29/14 0516 11/29/14 0744 11/29/14 1217  GLUCAP 47* 64* 78 82 180*       Signed:  Keil Braun  [facility] 11/30/2014, 9:50 AM

## 2014-12-18 ENCOUNTER — Encounter (HOSPITAL_COMMUNITY): Payer: Self-pay | Admitting: Emergency Medicine

## 2014-12-18 ENCOUNTER — Emergency Department (HOSPITAL_COMMUNITY): Payer: BLUE CROSS/BLUE SHIELD

## 2014-12-18 ENCOUNTER — Emergency Department (HOSPITAL_COMMUNITY)
Admission: EM | Admit: 2014-12-18 | Discharge: 2014-12-18 | Disposition: A | Discharge status: Home / Self Care | Payer: BLUE CROSS/BLUE SHIELD | Attending: Emergency Medicine | Admitting: Emergency Medicine

## 2014-12-18 DIAGNOSIS — E119 Type 2 diabetes mellitus without complications: Secondary | ICD-10-CM | POA: Insufficient documentation

## 2014-12-18 DIAGNOSIS — Z79899 Other long term (current) drug therapy: Secondary | ICD-10-CM | POA: Diagnosis not present

## 2014-12-18 DIAGNOSIS — Z794 Long term (current) use of insulin: Secondary | ICD-10-CM | POA: Insufficient documentation

## 2014-12-18 DIAGNOSIS — J45909 Unspecified asthma, uncomplicated: Secondary | ICD-10-CM | POA: Insufficient documentation

## 2014-12-18 DIAGNOSIS — R1011 Right upper quadrant pain: Secondary | ICD-10-CM | POA: Diagnosis present

## 2014-12-18 DIAGNOSIS — I10 Essential (primary) hypertension: Secondary | ICD-10-CM | POA: Insufficient documentation

## 2014-12-18 DIAGNOSIS — Z7951 Long term (current) use of inhaled steroids: Secondary | ICD-10-CM | POA: Diagnosis not present

## 2014-12-18 DIAGNOSIS — K8020 Calculus of gallbladder without cholecystitis without obstruction: Secondary | ICD-10-CM | POA: Diagnosis not present

## 2014-12-18 LAB — COMPREHENSIVE METABOLIC PANEL
ALBUMIN: 4.4 g/dL (ref 3.5–5.0)
ALK PHOS: 130 U/L — AB (ref 38–126)
ALT: 42 U/L (ref 14–54)
ANION GAP: 8 (ref 5–15)
AST: 42 U/L — ABNORMAL HIGH (ref 15–41)
BILIRUBIN TOTAL: 0.4 mg/dL (ref 0.3–1.2)
BUN: 14 mg/dL (ref 6–20)
CALCIUM: 9.9 mg/dL (ref 8.9–10.3)
CO2: 28 mmol/L (ref 22–32)
Chloride: 106 mmol/L (ref 101–111)
Creatinine, Ser: 0.53 mg/dL (ref 0.44–1.00)
GLUCOSE: 50 mg/dL — AB (ref 65–99)
Potassium: 3.5 mmol/L (ref 3.5–5.1)
Sodium: 142 mmol/L (ref 135–145)
TOTAL PROTEIN: 8.1 g/dL (ref 6.5–8.1)

## 2014-12-18 LAB — URINALYSIS, ROUTINE W REFLEX MICROSCOPIC
BILIRUBIN URINE: NEGATIVE
Ketones, ur: NEGATIVE mg/dL
Leukocytes, UA: NEGATIVE
NITRITE: NEGATIVE
PH: 7.5 (ref 5.0–8.0)
Protein, ur: NEGATIVE mg/dL
SPECIFIC GRAVITY, URINE: 1.035 — AB (ref 1.005–1.030)

## 2014-12-18 LAB — CBC
HCT: 40.4 % (ref 36.0–46.0)
HEMOGLOBIN: 13.4 g/dL (ref 12.0–15.0)
MCH: 32 pg (ref 26.0–34.0)
MCHC: 33.2 g/dL (ref 30.0–36.0)
MCV: 96.4 fL (ref 78.0–100.0)
Platelets: 429 10*3/uL — ABNORMAL HIGH (ref 150–400)
RBC: 4.19 MIL/uL (ref 3.87–5.11)
RDW: 13.2 % (ref 11.5–15.5)
WBC: 7 10*3/uL (ref 4.0–10.5)

## 2014-12-18 LAB — CBG MONITORING, ED: Glucose-Capillary: 82 mg/dL (ref 65–99)

## 2014-12-18 LAB — URINE MICROSCOPIC-ADD ON

## 2014-12-18 LAB — LIPASE, BLOOD: Lipase: 56 U/L — ABNORMAL HIGH (ref 11–51)

## 2014-12-18 MED ORDER — HYDROCODONE-ACETAMINOPHEN 5-325 MG PO TABS: 1.0000 | ORAL_TABLET | ORAL | Status: DC | PRN

## 2014-12-18 MED ORDER — KCL IN DEXTROSE-NACL 20-5-0.45 MEQ/L-%-% IV SOLN
Freq: Once | INTRAVENOUS | Status: AC
  Administered 2014-12-18: 19:00:00 via INTRAVENOUS
  Filled 2014-12-18: qty 1000

## 2014-12-18 MED ORDER — HYDROMORPHONE HCL 1 MG/ML IJ SOLN
1.0000 mg | Freq: Once | INTRAMUSCULAR | Status: AC
  Administered 2014-12-18: 1 mg via INTRAVENOUS
  Filled 2014-12-18: qty 1

## 2014-12-18 NOTE — ED Notes (Signed)
2 attempts at IV placement made.

## 2014-12-18 NOTE — ED Notes (Signed)
Ultrasound bedside.

## 2014-12-18 NOTE — ED Notes (Signed)
Per pt, states right upper abdominal pain since yesterday-states diagnosed with gallstones one month ago

## 2014-12-18 NOTE — ED Provider Notes (Signed)
CSN: [id number]     Arrival date & time 12/18/14  1232 History   First MD Initiated Contact with Patient 12/18/14 1616     Chief Complaint  Patient presents with  . Abdominal Pain     (Consider location/radiation/quality/duration/timing/severity/associated sxs/prior Treatment) Patient is a 24 y.o. female presenting with abdominal pain.  Abdominal Pain Associated symptoms: no chills, no constipation, no cough, no diarrhea, no fatigue, no fever, no nausea, no shortness of breath and no vomiting    24 year old female with history of cholelithiasis presents to the emergency room today with 1 day of progressively worsening right upper quadrant sharp and shooting abdominal pain. Radiates up towards her right armpit and was epigastric area. No exacerbating or relieving factors. Feels similar to previous episodes. No pleuritic component or fevers. No cough or shortness of breath.  Past Medical History  Diagnosis Date  . Diabetes mellitus   . Asthma   . Hypertension   . Headache(784.0)   . Pancreatitis, acute 11/26/2014  . Hepatic steatosis 11/26/2014  . Liver mass 11/26/2014   Past Surgical History  Procedure Laterality Date  . Wisdom tooth extraction    . Esophagogastroduodenoscopy (egd) with propofol Left 09/20/2014    Procedure: ESOPHAGOGASTRODUODENOSCOPY (EGD) WITH PROPOFOL;  Surgeon: Arta Silence, MD;  Location: [facility] ENDOSCOPY;  Service: Endoscopy;  Laterality: Left;   Family History  Problem Relation Age of Onset  . Diabetes Mother   . Hyperlipidemia Mother   . Hypertension Father   . Heart disease Father   . Heart disease Maternal Grandmother   . Heart disease Maternal Grandfather   . Hypertension Paternal Grandmother   . Cancer Paternal Grandfather    Social History  Substance Use Topics  . Smoking status: Never Smoker   . Smokeless tobacco: Never Used  . Alcohol Use: No   OB History    Gravida Para Term Preterm AB TAB SAB Ectopic Multiple Living   2 1 0 1 1 1 0 0 0 1       Review of Systems  Constitutional: Negative for fever, chills and fatigue.  HENT: Negative for congestion.   Eyes: Negative for photophobia and pain.  Respiratory: Negative for cough and shortness of breath.   Gastrointestinal: Positive for abdominal pain. Negative for nausea, vomiting, diarrhea, constipation and blood in stool.  Endocrine: Negative for polydipsia and polyuria.  Musculoskeletal: Negative for back pain and joint swelling.  All other systems reviewed and are negative.     Allergies  Peanut-containing drug products and Strawberry extract  Home Medications   Prior to Admission medications   Medication Sig Start Date End Date Taking? Authorizing Provider  albuterol (PROVENTIL HFA;VENTOLIN HFA) 108 (90 BASE) MCG/ACT inhaler Inhale 2 puffs into the lungs every 6 (six) hours as needed for shortness of breath.   Yes Historical Provider, MD  albuterol (PROVENTIL) (2.5 MG/3ML) 0.083% nebulizer solution Take 2.5 mg by nebulization every 6 (six) hours as needed for shortness of breath.   Yes Historical Provider, MD  budesonide-formoterol (SYMBICORT) 160-4.5 MCG/ACT inhaler Inhale 2 puffs into the lungs 2 (two) times daily.   Yes Historical Provider, MD  EPINEPHrine (EPIPEN) 0.3 mg/0.3 mL SOAJ injection Inject 0.3 mg into the muscle once.   Yes Historical Provider, MD  glucagon 1 MG injection Inject 1 mg into the muscle as needed. FOR LOW BLOOD SUGAR 01/31/14 01/31/15 Yes Historical Provider, MD  insulin aspart (NOVOLOG FLEXPEN) 100 UNIT/ML FlexPen INJECT 0-20 UNITS INTO THE SKIN AS DIRECTED. SSI - CARB SCALE, 0-20 UNITS  Patient taking differently: Inject into the skin 4 (four) times daily as needed for high blood sugar. Sliding scale 10/19/13  Yes Elayne Snare, MD  Insulin Glargine (LANTUS SOLOSTAR) 100 UNIT/ML Solostar Pen Inject 36 Units into the skin daily at 10 pm. 10/19/13  Yes Elayne Snare, MD  montelukast (SINGULAIR) 10 MG tablet Take 10 mg by mouth daily.   Yes Historical  Provider, MD  ondansetron (ZOFRAN) 4 MG tablet Take 1 tablet (4 mg total) by mouth every 8 (eight) hours as needed for nausea or vomiting. 11/29/14  Yes Hosie Poisson, MD  HYDROcodone-acetaminophen (NORCO/VICODIN) 5-325 MG tablet Take 1 tablet by mouth every 4 (four) hours as needed. 12/18/14   Merrily Pew, MD  pantoprazole (PROTONIX) 40 MG tablet Take 1 tablet (40 mg total) by mouth daily. Patient not taking: Reported on 11/24/2014 09/21/14   Florencia Reasons, MD   BP 126/95 mmHg  Pulse 82  Temp(Src) 98.2 F (36.8 C) (Oral)  Resp 16  SpO2 100%  LMP 12/11/2014 Physical Exam  Constitutional: She appears well-developed and well-nourished.  HENT:  Head: Normocephalic and atraumatic.  Neck: Normal range of motion. Neck supple.  Cardiovascular: Normal rate and regular rhythm.   Pulmonary/Chest: Effort normal. No stridor. No respiratory distress. She has no wheezes.  Abdominal: Soft. Bowel sounds are normal. She exhibits no distension. There is tenderness (ruq).  Musculoskeletal: Normal range of motion. She exhibits no edema or tenderness.  Neurological: She is alert.  Nursing note and vitals reviewed.   ED Course  Procedures (including critical care time) Labs Review Labs Reviewed  LIPASE, BLOOD - Abnormal; Notable for the following:    Lipase 56 (*)    All other components within normal limits  COMPREHENSIVE METABOLIC PANEL - Abnormal; Notable for the following:    Glucose, Bld 50 (*)    AST 42 (*)    Alkaline Phosphatase 130 (*)    All other components within normal limits  CBC - Abnormal; Notable for the following:    Platelets 429 (*)    All other components within normal limits  URINALYSIS, ROUTINE W REFLEX MICROSCOPIC (NOT AT [facility]) - Abnormal; Notable for the following:    Specific Gravity, Urine 1.035 (*)    Glucose, UA >1000 (*)    Hgb urine dipstick TRACE (*)    All other components within normal limits  URINE MICROSCOPIC-ADD ON - Abnormal; Notable for the following:     Squamous Epithelial / LPF 0-5 (*)    Bacteria, UA RARE (*)    All other components within normal limits  CBG MONITORING, ED    Imaging Review US Abdomen Limited Ruq  12/18/2014  CLINICAL DATA:  Right upper quadrant abdominal pain for 2 days EXAM: US ABDOMEN LIMITED - RIGHT UPPER QUADRANT COMPARISON:  11/27/2014 MRI, 11/25/2014 CT scan FINDINGS: Gallbladder: Mobile hyperechoic foci within the gallbladder suggesting small calculi. 2 mm hyperechoic focus attached to the nondependent wall. Gallbladder wall thickness is normal and there is no Murphy's sign. Common bile duct: Diameter: 2 mm Liver: Diffusely echogenic suggesting fatty infiltration. There are 4 masses visualized in the liver, measuring 14, 20, 19, and 28 mm SPECT only. These are described in detail on recent MRI with recommendations for follow-up discussed. IMPRESSION: Cholelithiasis with no evidence of acute cholecystitis. There is also possibly a tiny adherent calculus versus a tiny polyp. Refer to recent 11/27/2014 MRI abdomen for recommendations for follow-up of liver masses. Electronically Signed   By: Skipper Cliche M.D.   On: 12/18/2014  18:29   I have personally reviewed and evaluated these images and lab results as part of my medical decision-making.   EKG Interpretation None      MDM   Final diagnoses:  Calculus of gallbladder without cholecystitis without obstruction   Will eval for cholecystitis, if negative will dc with surgery follow up . PERC negative, doubt PE. Has a liver mass which can be characterized as well. No DKA, will continue to monitor CBG.  [place] negative for cholecystitis. Symptoms improved. Will give EGS FU information.     Merrily Pew, MD 12/19/14 [phone]

## 2014-12-18 NOTE — ED Notes (Signed)
Patient states that she cant give sample at this time.

## 2015-02-05 ENCOUNTER — Ambulatory Visit: Payer: Self-pay | Admitting: General Surgery

## 2015-02-05 NOTE — Patient Instructions (Addendum)
YOUR PROCEDURE IS SCHEDULED ON :  02/11/15  REPORT TO [facility] MAIN ENTRANCE FOLLOW SIGNS TO EAST ELEVATOR - GO TO 3rd FLOOR CHECK IN AT 3 EAST NURSES STATION (SHORT STAY) AT:  9:45 AM  CALL THIS NUMBER IF YOU HAVE PROBLEMS THE MORNING OF SURGERY [phone]  REMEMBER:ONLY 1 PER PERSON MAY GO TO SHORT STAY WITH YOU TO GET READY THE MORNING OF YOUR SURGERY  DO NOT EAT FOOD OR DRINK LIQUIDS AFTER MIDNIGHT  TAKE THESE MEDICINES THE MORNING OF SURGERY: SINGULAIR / USE SYMBICORT / TAKE 1/2 DOSE (12 UNITS ) OF INSULIN THE NIGHT BEFORE SURGERY  YOU MAY NOT HAVE ANY METAL ON YOUR BODY INCLUDING HAIR PINS AND PIERCING'S. DO NOT WEAR JEWELRY, MAKEUP, LOTIONS, POWDERS OR PERFUMES. DO NOT WEAR NAIL POLISH. DO NOT SHAVE 48 HRS PRIOR TO SURGERY. MEN MAY SHAVE FACE AND NECK.  DO NOT [place]. [facility] IS NOT RESPONSIBLE FOR VALUABLES.  CONTACTS, DENTURES OR PARTIALS MAY NOT BE WORN TO SURGERY. LEAVE SUITCASE IN CAR. CAN BE BROUGHT TO ROOM AFTER SURGERY.  PATIENTS DISCHARGED THE DAY OF SURGERY WILL NOT BE ALLOWED TO DRIVE HOME.  PLEASE READ OVER THE FOLLOWING INSTRUCTION SHEETS _________________________________________________________________________________                                          [facility] - PREPARING FOR SURGERY  Before surgery, you can play an important role.  Because skin is not sterile, your skin needs to be as free of germs as possible.  You can reduce the number of germs on your skin by washing with CHG (chlorahexidine gluconate) soap before surgery.  CHG is an antiseptic cleaner which kills germs and bonds with the skin to continue killing germs even after washing. Please DO NOT use if you have an allergy to CHG or antibacterial soaps.  If your skin becomes reddened/irritated stop using the CHG and inform your nurse when you arrive at Short Stay. Do not shave (including legs and underarms) for at least 48 hours prior to the first  CHG shower.  You may shave your face. Please follow these instructions carefully:   1.  Shower with CHG Soap the night before surgery and the  morning of Surgery.   2.  If you choose to wash your hair, wash your hair first as usual with your  normal  Shampoo.   3.  After you shampoo, rinse your hair and body thoroughly to remove the  shampoo.                                         4.  Use CHG as you would any other liquid soap.  You can apply chg directly  to the skin and wash . Gently wash with scrungie or clean wascloth    5.  Apply the CHG Soap to your body ONLY FROM THE NECK DOWN.   Do not use on open                           Wound or open sores. Avoid contact with eyes, ears mouth and genitals (private parts).  Genitals (private parts) with your normal soap.              6.  Wash thoroughly, paying special attention to the area where your surgery  will be performed.   7.  Thoroughly rinse your body with warm water from the neck down.   8.  DO NOT shower/wash with your normal soap after using and rinsing off  the CHG Soap .                9.  Pat yourself dry with a clean towel.             10.  Wear clean night clothes to bed after shower             11.  Place clean sheets on your bed the night of your first shower and do not  sleep with pets.  Day of Surgery : Do not apply any lotions/deodorants the morning of surgery.  Please wear clean clothes to the hospital/surgery center.  FAILURE TO FOLLOW THESE INSTRUCTIONS MAY RESULT IN THE CANCELLATION OF YOUR SURGERY    PATIENT SIGNATURE_________________________________  ______________________________________________________________________

## 2015-02-05 NOTE — H&P (Signed)
[name] 01/04/2015 8:40 AM Location: [facility] Patient #: [id number] DOB: [birth year] Single / Language: [name] / Race: Black or African American Female   History of Present Illness Stefanie Hiss M. Everitt Wenner MD; 01/04/2015 9:16 AM) The patient is a 25 year old female who presents for evaluation of gallbladder disease. She is referred by Dr. Ouida Braun to discuss gallbladder issues. She was hospitalized in early November for pancreatitis. She was found to have gallstones. She was also admitted in DKA. She had nausea and vomiting as her primary complaints. She ended up back in the emergency room at the end of November with epigastric and right upper quadrant pain. She's undergone an exhaustive workup including ultrasound, CT, and MRI. She was also found to have hepatic steatosis. As well as four masses in the liver. On MRI these masses appeared to reflect benign hepatic adenomas. Follow-up MRI was recommended in 6 months. In late November when she came back to the emergency room with epigastric and right upper quadrant pain she had a mildly elevated AST at 42 and an elevated alkaline phosphatase at 130. She denies any current belly pain. She denies any fevers or chills. She denies any current nausea or vomiting. She reports that her sugars are doing well. She denies any tobacco.   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 Hydrocodone-Acetaminophen (5-325MG  Tablet, Oral) Active. Lantus SoloStar (100UNIT/ML Soln Pen-inj, Subcutaneous) Active. NovoLOG FlexPen (100UNIT/ML Soln Pen-inj, Subcutaneous) Active. Pantoprazole Sodium (40MG  Tablet DR, Oral) Active. Montelukast Sodium (10MG  Tablet, Oral) Active. Ondansetron (4MG  Tablet, Oral) Active. Glucagon (1MG  For Solution, Injection) Active. Medications Reconciled  Social History [name], CMA; 01/04/2015 8:40 AM) Alcohol use Occasional alcohol use. No caffeine use No drug use Tobacco use Never smoker.  Family History [name], [place]; 01/04/2015 8:40 AM) Diabetes Mellitus Mother. Hypertension Father.  Pregnancy / Birth History [name], CMA; 01/04/2015 8:40 AM) Age at menarche 42 years. Contraceptive History Oral contraceptives. Gravida 2 Maternal age 55-25 Para 1 Regular periods    Review of Systems [name] CMA; 01/04/2015 8:40 AM) General Present- Chills and Night Sweats. Not Present- Appetite Loss, Fatigue, Fever, Weight Gain and Weight Loss. Skin Not Present- Change in Wart/Mole, Dryness, Hives, Jaundice, New Lesions, Non-Healing Wounds, Rash and Ulcer. HEENT Present- Seasonal Allergies. Not Present- Earache, Hearing Loss, Hoarseness, Nose Bleed, Oral Ulcers, Ringing in the Ears, Sinus Pain, Sore Throat, Visual Disturbances, Wears glasses/contact lenses and Yellow Eyes. Respiratory Not Present- Bloody sputum, Chronic Cough, Difficulty Breathing, Snoring and Wheezing. Breast Not Present- Breast Mass, Breast Pain, Nipple Discharge and Skin Changes. Cardiovascular Not Present- Chest Pain, Difficulty Breathing Lying Down, Leg Cramps, Palpitations, Rapid Heart Rate, Shortness of Breath and Swelling of Extremities. Gastrointestinal Present- Abdominal Pain. Not Present- Bloating, Bloody Stool, Change in Bowel  Habits, Chronic diarrhea, Constipation, Difficulty Swallowing, Excessive gas, Gets full quickly at meals, Hemorrhoids, Indigestion, Nausea, Rectal Pain and Vomiting. Female Genitourinary Not Present- Frequency, Nocturia, Painful Urination, Pelvic Pain and Urgency. Musculoskeletal Not Present- Back Pain,  Joint Pain, Joint Stiffness, Muscle Pain, Muscle Weakness and Swelling of Extremities. Neurological Not Present- Decreased Memory, Fainting, Headaches, Numbness, Seizures, Tingling, Tremor, Trouble walking and Weakness. Psychiatric Present- Anxiety. Not Present- Bipolar, Change in Sleep Pattern, Depression, Fearful and Frequent crying. Endocrine Not Present- Cold Intolerance, Excessive Hunger, Hair Changes, Heat Intolerance, Hot flashes and New Diabetes. Hematology Not Present- Easy Bruising, Excessive bleeding, Gland problems, HIV and Persistent Infections.  Vitals [name] CMA; 01/04/2015 8:42 AM) 01/04/2015 8:42 AM Weight: 118 lb Height: 64in Body Surface Area: 1.56 m Body Mass Index: 20.25 kg/m  Temp.: 99.6F(Temporal)  Pulse: 96 (Regular)  BP: 130/68 (Sitting, Left Arm, Standard)       Physical Exam Stefanie Hiss M. Emmely Bittinger MD; 01/04/2015 9:07 AM) General Mental Status-Alert. General Appearance-Consistent with stated age. Hydration-Well hydrated. Voice-Normal.  Head and Neck Head-normocephalic, atraumatic with no lesions or palpable masses. Trachea-midline. Thyroid Gland Characteristics - normal size and consistency.  Eye Eyeball - Bilateral-Extraocular movements intact. Sclera/Conjunctiva - Bilateral-No scleral icterus.  Chest and Lung Exam Chest and lung exam reveals -quiet, even and easy respiratory effort with no use of accessory muscles and on auscultation, normal breath sounds, no adventitious sounds and normal vocal resonance. Inspection Chest Wall - Normal. Back - normal.  Breast - Did not examine.  Cardiovascular Cardiovascular  examination reveals -normal heart sounds, regular rate and rhythm with no murmurs and normal pedal pulses bilaterally.  Abdomen Inspection Inspection of the abdomen reveals - No Hernias. Skin - Scar - no surgical scars. Palpation/Percussion Palpation and Percussion of the abdomen reveal - Soft, Non Tender, No Rebound tenderness, No Rigidity (guarding) and No hepatosplenomegaly. Auscultation Auscultation of the abdomen reveals - Bowel sounds normal.  Peripheral Vascular Upper Extremity Palpation - Pulses bilaterally normal.  Neurologic Neurologic evaluation reveals -alert and oriented x 3 with no impairment of recent or remote memory. Mental Status-Normal.  Neuropsychiatric The patient's mood and affect are described as -normal. Judgment and Insight-insight is appropriate concerning matters relevant to self.  Musculoskeletal Normal Exam - Left-Upper Extremity Strength Normal and Lower Extremity Strength Normal. Normal Exam - Right-Upper Extremity Strength Normal and Lower Extremity Strength Normal.  Lymphatic Head & Neck  General Head & Neck Lymphatics: Bilateral - Description - Normal. Axillary - Did not examine. Femoral & Inguinal - Did not examine.    Assessment & Plan Stefanie Hiss M. Peityn Payton MD; 01/04/2015 9:19 AM) SYMPTOMATIC CHOLELITHIASIS (K80.20) Impression: I believe some of the patient's symptoms are consistent with gallbladder disease.  We discussed gallbladder disease. The patient was given Neurosurgeon. We discussed non-operative and operative management. We discussed the signs & symptoms of acute cholecystitis  I discussed laparoscopic cholecystectomy with IOC in detail. The patient was given educational material as well as diagrams detailing the procedure. We discussed the risks and benefits of a laparoscopic cholecystectomy including, but not limited to bleeding, infection, injury to surrounding structures such as the intestine or liver, bile  leak, retained gallstones, need to convert to an open procedure, prolonged diarrhea, blood clots such as DVT, common bile duct injury, anesthesia risks, and possible need for additional procedures. We discussed the typical post-operative recovery course. I explained that the likelihood of improvement of their symptoms is fair to good.  The patient has elected to proceed with surgery. Current Plans Pt Education - Pamphlet Given - Laparoscopic Gallbladder Surgery: discussed with patient and provided information. You are being scheduled for surgery - Our schedulers will call you.  You should hear from our office's scheduling department within 5 working  days about the location, date, and time of surgery. We try to make accommodations for patient's preferences in scheduling surgery, but sometimes the OR schedule or the surgeon's schedule prevents [place] from making those accommodations.  If you have not heard from our office [phone]) in 5 working days, call the office and ask for your surgeon's nurse.  If you have other questions about your diagnosis, plan, or surgery, call the office and ask for your surgeon's nurse.  HEPATIC STEATOSIS (K76.0) Impression: It is unclear the etiology of her hepatic steatosis given the fact that she is not morbidly obese. I have recommended that she probably needs to get established with the [facility] liver program in the area to monitor and workup her hepatic steatosis. I did explain that her mildly elevated LFTs in the past may not be normalized after cholecystectomy. They may be in fact due to her hepatic steatosis. 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 We'll defer this to her primary care physician  Leighton Ruff. Redmond Pulling, MD, FACS General, Bariatric, & Minimally Invasive Surgery [facility], [place]

## 2015-02-06 ENCOUNTER — Encounter (HOSPITAL_COMMUNITY): Payer: Self-pay

## 2015-02-06 ENCOUNTER — Encounter (HOSPITAL_COMMUNITY)
Admission: RE | Admit: 2015-02-06 | Discharge: 2015-02-06 | Disposition: A | Discharge status: Home / Self Care | Payer: BLUE CROSS/BLUE SHIELD | Source: Ambulatory Visit | Attending: General Surgery | Admitting: General Surgery

## 2015-02-06 DIAGNOSIS — Z01812 Encounter for preprocedural laboratory examination: Secondary | ICD-10-CM | POA: Diagnosis not present

## 2015-02-06 HISTORY — DX: Calculus of gallbladder without cholecystitis without obstruction: K80.20

## 2015-02-06 HISTORY — DX: Anxiety disorder, unspecified: F41.9

## 2015-02-06 HISTORY — DX: Unspecified osteoarthritis, unspecified site: M19.90

## 2015-02-06 HISTORY — DX: Cardiac murmur, unspecified: R01.1

## 2015-02-06 LAB — CBC WITH DIFFERENTIAL/PLATELET
BASOS ABS: 0 10*3/uL (ref 0.0–0.1)
BASOS PCT: 0 %
EOS PCT: 2 %
Eosinophils Absolute: 0.1 10*3/uL (ref 0.0–0.7)
HEMATOCRIT: 37.5 % (ref 36.0–46.0)
Hemoglobin: 12.5 g/dL (ref 12.0–15.0)
LYMPHS PCT: 34 %
Lymphs Abs: 1.8 10*3/uL (ref 0.7–4.0)
MCH: 30.4 pg (ref 26.0–34.0)
MCHC: 33.3 g/dL (ref 30.0–36.0)
MCV: 91.2 fL (ref 78.0–100.0)
Monocytes Absolute: 0.3 10*3/uL (ref 0.1–1.0)
Monocytes Relative: 5 %
NEUTROS ABS: 3.1 10*3/uL (ref 1.7–7.7)
Neutrophils Relative %: 59 %
PLATELETS: 314 10*3/uL (ref 150–400)
RBC: 4.11 MIL/uL (ref 3.87–5.11)
RDW: 12.6 % (ref 11.5–15.5)
WBC: 5.2 10*3/uL (ref 4.0–10.5)

## 2015-02-06 LAB — COMPREHENSIVE METABOLIC PANEL
ALBUMIN: 3.7 g/dL (ref 3.5–5.0)
ALT: 12 U/L — AB (ref 14–54)
AST: 28 U/L (ref 15–41)
Alkaline Phosphatase: 84 U/L (ref 38–126)
Anion gap: 10 (ref 5–15)
BUN: 13 mg/dL (ref 6–20)
CHLORIDE: 104 mmol/L (ref 101–111)
CO2: 27 mmol/L (ref 22–32)
CREATININE: 0.6 mg/dL (ref 0.44–1.00)
Calcium: 9.5 mg/dL (ref 8.9–10.3)
GFR calc Af Amer: >60 mL/min (ref >60)
GLUCOSE: 327 mg/dL — AB (ref 65–99)
POTASSIUM: 4.8 mmol/L (ref 3.5–5.1)
Sodium: 141 mmol/L (ref 135–145)
Total Bilirubin: 0.4 mg/dL (ref 0.3–1.2)
Total Protein: 7 g/dL (ref 6.5–8.1)

## 2015-02-06 LAB — HCG, SERUM, QUALITATIVE: Preg, Serum: NEGATIVE

## 2015-02-06 NOTE — Progress Notes (Addendum)
Abnormal CMET faxed to Dr.Wilson Glucose result called to office - Claiborne Billings will let Dr.Wilson know

## 2015-02-08 NOTE — Anesthesia Preprocedure Evaluation (Addendum)
Anesthesia Evaluation  Patient identified by MRN, date of birth, ID band Patient awake    Reviewed: Allergy & Precautions, NPO status , Patient's Chart, lab work & pertinent test results  Airway Mallampati: II   Neck ROM: Full    Dental  (+) Teeth Intact, Dental Advisory Given   Pulmonary neg pulmonary ROS, asthma ,    breath sounds clear to auscultation       Cardiovascular hypertension, Pt. on medications negative cardio ROS   Rhythm:Regular  EKG Sinus Tach   Neuro/Psych  Headaches, Anxiety negative neurological ROS  negative psych ROS   GI/Hepatic negative GI ROS, Neg liver ROS, Fatty Liver, 4 liver masses thought to be adenomas, Pancreatitis 11/2014   Endo/Other  negative endocrine ROSdiabetes, Poorly Controlled, Type 1, Insulin DependentHas had DKA before  Renal/GU negative Renal ROS  negative genitourinary   Musculoskeletal negative musculoskeletal ROS (+)   Abdominal (+)  Abdomen: soft.    Peds negative pediatric ROS (+)  Hematology negative hematology ROS (+) 12/37   Anesthesia Other Findings   Reproductive/Obstetrics negative OB ROS                           Anesthesia Physical Anesthesia Plan  ASA: II  Anesthesia Plan: General   Post-op Pain Management:    Induction:   Airway Management Planned: Oral ETT  Additional Equipment:   Intra-op Plan:   Post-operative Plan: Extubation in OR  Informed Consent: I have reviewed the patients History and Physical, chart, labs and discussed the procedure including the risks, benefits and alternatives for the proposed anesthesia with the patient or authorized representative who has indicated his/her understanding and acceptance.     Plan Discussed with:   Anesthesia Plan Comments: (No Tylenol, fatty liver, PG test, DKA in past Type I DM)        Anesthesia Quick Evaluation

## 2015-02-11 ENCOUNTER — Ambulatory Visit (HOSPITAL_COMMUNITY): Payer: BLUE CROSS/BLUE SHIELD | Admitting: Anesthesiology

## 2015-02-11 ENCOUNTER — Ambulatory Visit (HOSPITAL_COMMUNITY): Payer: BLUE CROSS/BLUE SHIELD

## 2015-02-11 ENCOUNTER — Inpatient Hospital Stay (HOSPITAL_COMMUNITY)
Admission: AD | Admit: 2015-02-11 | Discharge: 2015-02-12 | DRG: 419 | Disposition: A | Discharge status: Home / Self Care | Payer: BLUE CROSS/BLUE SHIELD | Source: Ambulatory Visit | Attending: General Surgery | Admitting: General Surgery

## 2015-02-11 ENCOUNTER — Encounter (HOSPITAL_COMMUNITY): Payer: Self-pay | Admitting: *Deleted

## 2015-02-11 ENCOUNTER — Encounter (HOSPITAL_COMMUNITY)
Admission: AD | Disposition: A | Discharge status: Home / Self Care | Payer: Self-pay | Source: Ambulatory Visit | Attending: General Surgery

## 2015-02-11 DIAGNOSIS — K760 Fatty (change of) liver, not elsewhere classified: Secondary | ICD-10-CM | POA: Diagnosis present

## 2015-02-11 DIAGNOSIS — Z9049 Acquired absence of other specified parts of digestive tract: Secondary | ICD-10-CM | POA: Diagnosis not present

## 2015-02-11 DIAGNOSIS — F419 Anxiety disorder, unspecified: Secondary | ICD-10-CM | POA: Diagnosis present

## 2015-02-11 DIAGNOSIS — I10 Essential (primary) hypertension: Secondary | ICD-10-CM | POA: Diagnosis present

## 2015-02-11 DIAGNOSIS — K8000 Calculus of gallbladder with acute cholecystitis without obstruction: Secondary | ICD-10-CM | POA: Diagnosis present

## 2015-02-11 DIAGNOSIS — E109 Type 1 diabetes mellitus without complications: Secondary | ICD-10-CM | POA: Diagnosis present

## 2015-02-11 DIAGNOSIS — Z79899 Other long term (current) drug therapy: Secondary | ICD-10-CM | POA: Diagnosis not present

## 2015-02-11 DIAGNOSIS — Z794 Long term (current) use of insulin: Secondary | ICD-10-CM | POA: Diagnosis not present

## 2015-02-11 DIAGNOSIS — Z8249 Family history of ischemic heart disease and other diseases of the circulatory system: Secondary | ICD-10-CM | POA: Diagnosis not present

## 2015-02-11 DIAGNOSIS — Z01812 Encounter for preprocedural laboratory examination: Secondary | ICD-10-CM | POA: Diagnosis not present

## 2015-02-11 DIAGNOSIS — R061 Stridor: Secondary | ICD-10-CM

## 2015-02-11 DIAGNOSIS — R109 Unspecified abdominal pain: Secondary | ICD-10-CM | POA: Diagnosis present

## 2015-02-11 DIAGNOSIS — J45909 Unspecified asthma, uncomplicated: Secondary | ICD-10-CM

## 2015-02-11 DIAGNOSIS — J9589 Other postprocedural complications and disorders of respiratory system, not elsewhere classified: Secondary | ICD-10-CM | POA: Insufficient documentation

## 2015-02-11 DIAGNOSIS — Z419 Encounter for procedure for purposes other than remedying health state, unspecified: Secondary | ICD-10-CM

## 2015-02-11 HISTORY — PX: CHOLECYSTECTOMY: SHX55

## 2015-02-11 LAB — GLUCOSE, CAPILLARY
GLUCOSE-CAPILLARY: 339 mg/dL — AB (ref 65–99)
GLUCOSE-CAPILLARY: 147 mg/dL — AB (ref 65–99)
GLUCOSE-CAPILLARY: 183 mg/dL — AB (ref 65–99)
Glucose-Capillary: 209 mg/dL — ABNORMAL HIGH (ref 65–99)
Glucose-Capillary: 136 mg/dL — ABNORMAL HIGH (ref 65–99)

## 2015-02-11 LAB — MRSA PCR SCREENING: MRSA BY PCR: NEGATIVE

## 2015-02-11 SURGERY — LAPAROSCOPIC CHOLECYSTECTOMY WITH INTRAOPERATIVE CHOLANGIOGRAM
Anesthesia: General

## 2015-02-11 MED ORDER — DEXAMETHASONE SODIUM PHOSPHATE 10 MG/ML IJ SOLN: 10.0000 mg | Freq: Once | INTRAMUSCULAR | Status: DC

## 2015-02-11 MED ORDER — IOHEXOL 300 MG/ML  SOLN
INTRAMUSCULAR | Status: DC | PRN
  Administered 2015-02-11: 5 mL via ORAL

## 2015-02-11 MED ORDER — ALBUTEROL SULFATE (2.5 MG/3ML) 0.083% IN NEBU
INHALATION_SOLUTION | RESPIRATORY_TRACT | Status: AC
  Filled 2015-02-11: qty 3

## 2015-02-11 MED ORDER — ONDANSETRON HCL 4 MG/2ML IJ SOLN
INTRAMUSCULAR | Status: DC | PRN
  Administered 2015-02-11: 4 mg via INTRAVENOUS

## 2015-02-11 MED ORDER — PANTOPRAZOLE SODIUM 40 MG IV SOLR
40.0000 mg | Freq: Every day | INTRAVENOUS | Status: DC
  Administered 2015-02-11: 40 mg via INTRAVENOUS
  Filled 2015-02-11: qty 40

## 2015-02-11 MED ORDER — MIDAZOLAM HCL 5 MG/5ML IJ SOLN
INTRAMUSCULAR | Status: DC | PRN
  Administered 2015-02-11: 2 mg via INTRAVENOUS

## 2015-02-11 MED ORDER — PROMETHAZINE HCL 25 MG/ML IJ SOLN: 6.2500 mg | INTRAMUSCULAR | Status: DC | PRN

## 2015-02-11 MED ORDER — SODIUM CHLORIDE 0.9 % IJ SOLN: 3.0000 mL | INTRAMUSCULAR | Status: DC | PRN

## 2015-02-11 MED ORDER — DEXAMETHASONE SODIUM PHOSPHATE 10 MG/ML IJ SOLN
INTRAMUSCULAR | Status: AC
  Filled 2015-02-11: qty 1

## 2015-02-11 MED ORDER — BUPIVACAINE-EPINEPHRINE 0.25% -1:200000 IJ SOLN
INTRAMUSCULAR | Status: DC | PRN
  Administered 2015-02-11: 30 mL

## 2015-02-11 MED ORDER — NEOSTIGMINE METHYLSULFATE 10 MG/10ML IV SOLN
INTRAVENOUS | Status: AC
  Filled 2015-02-11: qty 1

## 2015-02-11 MED ORDER — FENTANYL CITRATE (PF) 100 MCG/2ML IJ SOLN
INTRAMUSCULAR | Status: DC | PRN
  Administered 2015-02-11: 100 ug via INTRAVENOUS
  Administered 2015-02-11 (×3): 50 ug via INTRAVENOUS

## 2015-02-11 MED ORDER — LACTATED RINGERS IV SOLN
INTRAVENOUS | Status: DC | PRN
  Administered 2015-02-11: 12:00:00 via INTRAVENOUS
  Administered 2015-02-11: 1000 mL

## 2015-02-11 MED ORDER — ROCURONIUM BROMIDE 100 MG/10ML IV SOLN
INTRAVENOUS | Status: DC | PRN
  Administered 2015-02-11: 10 mg via INTRAVENOUS
  Administered 2015-02-11: 30 mg via INTRAVENOUS
  Administered 2015-02-11: 10 mg via INTRAVENOUS

## 2015-02-11 MED ORDER — IBUPROFEN 200 MG PO TABS
600.0000 mg | ORAL_TABLET | Freq: Four times a day (QID) | ORAL | Status: DC | PRN
  Administered 2015-02-12: 600 mg via ORAL
  Filled 2015-02-11: qty 3

## 2015-02-11 MED ORDER — DEXAMETHASONE SODIUM PHOSPHATE 4 MG/ML IJ SOLN
4.0000 mg | Freq: Four times a day (QID) | INTRAMUSCULAR | Status: DC
  Administered 2015-02-11 – 2015-02-12 (×3): 4 mg via INTRAVENOUS
  Filled 2015-02-11 (×9): qty 1

## 2015-02-11 MED ORDER — BUPIVACAINE-EPINEPHRINE (PF) 0.25% -1:200000 IJ SOLN
INTRAMUSCULAR | Status: AC
  Filled 2015-02-11: qty 30

## 2015-02-11 MED ORDER — MORPHINE SULFATE (PF) 10 MG/ML IV SOLN
1.0000 mg | INTRAVENOUS | Status: DC | PRN
  Administered 2015-02-12: 1 mg via INTRAVENOUS
  Filled 2015-02-11: qty 1

## 2015-02-11 MED ORDER — FENTANYL CITRATE (PF) 100 MCG/2ML IJ SOLN
INTRAMUSCULAR | Status: AC
  Filled 2015-02-11: qty 2

## 2015-02-11 MED ORDER — FENTANYL CITRATE (PF) 100 MCG/2ML IJ SOLN
25.0000 ug | INTRAMUSCULAR | Status: DC | PRN
  Administered 2015-02-11 (×3): 50 ug via INTRAVENOUS

## 2015-02-11 MED ORDER — ONDANSETRON 4 MG PO TBDP
4.0000 mg | ORAL_TABLET | Freq: Four times a day (QID) | ORAL | Status: DC | PRN
  Filled 2015-02-11: qty 1

## 2015-02-11 MED ORDER — INSULIN ASPART 100 UNIT/ML ~~LOC~~ SOLN
3.0000 [IU] | Freq: Three times a day (TID) | SUBCUTANEOUS | Status: DC
  Administered 2015-02-11 – 2015-02-12 (×3): 3 [IU] via SUBCUTANEOUS

## 2015-02-11 MED ORDER — INSULIN ASPART 100 UNIT/ML ~~LOC~~ SOLN
4.0000 [IU] | Freq: Once | SUBCUTANEOUS | Status: AC
  Administered 2015-02-11: 4 [IU] via SUBCUTANEOUS
  Filled 2015-02-11: qty 1

## 2015-02-11 MED ORDER — ACETAMINOPHEN 325 MG PO TABS: 650.0000 mg | ORAL_TABLET | ORAL | Status: DC | PRN

## 2015-02-11 MED ORDER — CEFOTETAN DISODIUM-DEXTROSE 2-2.08 GM-% IV SOLR
INTRAVENOUS | Status: AC
  Filled 2015-02-11: qty 50

## 2015-02-11 MED ORDER — SODIUM CHLORIDE 0.9 % IV SOLN: 250.0000 mL | INTRAVENOUS | Status: DC | PRN

## 2015-02-11 MED ORDER — ONDANSETRON HCL 4 MG/2ML IJ SOLN: 4.0000 mg | Freq: Four times a day (QID) | INTRAMUSCULAR | Status: DC | PRN

## 2015-02-11 MED ORDER — GLYCOPYRROLATE 0.2 MG/ML IJ SOLN
INTRAMUSCULAR | Status: AC
  Filled 2015-02-11: qty 3

## 2015-02-11 MED ORDER — CHLORHEXIDINE GLUCONATE 4 % EX LIQD: 1.0000 "application " | Freq: Once | CUTANEOUS | Status: DC

## 2015-02-11 MED ORDER — GLYCOPYRROLATE 0.2 MG/ML IJ SOLN
INTRAMUSCULAR | Status: DC | PRN
  Administered 2015-02-11: 0.4 mg via INTRAVENOUS

## 2015-02-11 MED ORDER — SIMETHICONE 80 MG PO CHEW
40.0000 mg | CHEWABLE_TABLET | Freq: Four times a day (QID) | ORAL | Status: DC | PRN
  Administered 2015-02-12: 40 mg via ORAL
  Filled 2015-02-11 (×2): qty 1

## 2015-02-11 MED ORDER — ALBUTEROL SULFATE (2.5 MG/3ML) 0.083% IN NEBU
2.5000 mg | INHALATION_SOLUTION | Freq: Four times a day (QID) | RESPIRATORY_TRACT | Status: DC | PRN
Start: 2015-02-11 — End: 2015-02-11

## 2015-02-11 MED ORDER — MIDAZOLAM HCL 2 MG/2ML IJ SOLN
INTRAMUSCULAR | Status: AC
  Filled 2015-02-11: qty 2

## 2015-02-11 MED ORDER — 0.9 % SODIUM CHLORIDE (POUR BTL) OPTIME
TOPICAL | Status: DC | PRN
  Administered 2015-02-11: 1000 mL

## 2015-02-11 MED ORDER — SODIUM CHLORIDE 0.9 % IJ SOLN: 3.0000 mL | Freq: Two times a day (BID) | INTRAMUSCULAR | Status: DC

## 2015-02-11 MED ORDER — ALBUTEROL SULFATE (2.5 MG/3ML) 0.083% IN NEBU
2.5000 mg | INHALATION_SOLUTION | Freq: Once | RESPIRATORY_TRACT | Status: AC
  Administered 2015-02-11: 2.5 mg via RESPIRATORY_TRACT

## 2015-02-11 MED ORDER — METHOCARBAMOL 500 MG PO TABS: 500.0000 mg | ORAL_TABLET | Freq: Four times a day (QID) | ORAL | Status: DC | PRN

## 2015-02-11 MED ORDER — KETOROLAC TROMETHAMINE 30 MG/ML IJ SOLN
INTRAMUSCULAR | Status: AC
  Filled 2015-02-11: qty 1

## 2015-02-11 MED ORDER — SODIUM CHLORIDE 0.9 % IV SOLN
INTRAVENOUS | Status: DC
  Administered 2015-02-11: 16:00:00 via INTRAVENOUS

## 2015-02-11 MED ORDER — MONTELUKAST SODIUM 10 MG PO TABS
10.0000 mg | ORAL_TABLET | Freq: Every day | ORAL | Status: DC
  Administered 2015-02-12: 10 mg via ORAL
  Filled 2015-02-11: qty 1

## 2015-02-11 MED ORDER — RACEPINEPHRINE HCL 2.25 % IN NEBU: 0.5000 mL | INHALATION_SOLUTION | Freq: Once | RESPIRATORY_TRACT | Status: DC

## 2015-02-11 MED ORDER — LIDOCAINE HCL (CARDIAC) 20 MG/ML IV SOLN
INTRAVENOUS | Status: AC
  Filled 2015-02-11: qty 5

## 2015-02-11 MED ORDER — CEFOTETAN DISODIUM-DEXTROSE 2-2.08 GM-% IV SOLR
2.0000 g | INTRAVENOUS | Status: AC
  Administered 2015-02-11: 2 g via INTRAVENOUS

## 2015-02-11 MED ORDER — ALBUTEROL SULFATE (2.5 MG/3ML) 0.083% IN NEBU: 3.0000 mL | INHALATION_SOLUTION | Freq: Four times a day (QID) | RESPIRATORY_TRACT | Status: DC | PRN

## 2015-02-11 MED ORDER — KETOROLAC TROMETHAMINE 30 MG/ML IJ SOLN
INTRAMUSCULAR | Status: DC | PRN
  Administered 2015-02-11: 30 mg via INTRAVENOUS

## 2015-02-11 MED ORDER — HEPARIN SODIUM (PORCINE) 5000 UNIT/ML IJ SOLN
5000.0000 [IU] | Freq: Three times a day (TID) | INTRAMUSCULAR | Status: DC
  Administered 2015-02-12 (×2): 5000 [IU] via SUBCUTANEOUS
  Filled 2015-02-11 (×2): qty 1

## 2015-02-11 MED ORDER — BUDESONIDE-FORMOTEROL FUMARATE 160-4.5 MCG/ACT IN AERO
2.0000 | INHALATION_SPRAY | Freq: Two times a day (BID) | RESPIRATORY_TRACT | Status: DC
  Administered 2015-02-11 – 2015-02-12 (×2): 2 via RESPIRATORY_TRACT
  Filled 2015-02-11: qty 6

## 2015-02-11 MED ORDER — INSULIN ASPART 100 UNIT/ML ~~LOC~~ SOLN: 0.0000 [IU] | Freq: Three times a day (TID) | SUBCUTANEOUS | Status: DC

## 2015-02-11 MED ORDER — OXYCODONE HCL 5 MG PO TABS
5.0000 mg | ORAL_TABLET | ORAL | Status: DC | PRN
  Administered 2015-02-11 – 2015-02-12 (×2): 5 mg via ORAL
  Filled 2015-02-11 (×2): qty 1

## 2015-02-11 MED ORDER — LIDOCAINE HCL (CARDIAC) 20 MG/ML IV SOLN
INTRAVENOUS | Status: DC | PRN
  Administered 2015-02-11: 100 mg via INTRAVENOUS

## 2015-02-11 MED ORDER — PROPOFOL 10 MG/ML IV BOLUS
INTRAVENOUS | Status: DC | PRN
  Administered 2015-02-11: 140 mg via INTRAVENOUS

## 2015-02-11 MED ORDER — INSULIN ASPART 100 UNIT/ML ~~LOC~~ SOLN
0.0000 [IU] | SUBCUTANEOUS | Status: DC
  Administered 2015-02-11: 4 [IU] via SUBCUTANEOUS

## 2015-02-11 MED ORDER — FENTANYL CITRATE (PF) 250 MCG/5ML IJ SOLN
INTRAMUSCULAR | Status: AC
  Filled 2015-02-11: qty 5

## 2015-02-11 MED ORDER — NEOSTIGMINE METHYLSULFATE 10 MG/10ML IV SOLN
INTRAVENOUS | Status: DC | PRN
  Administered 2015-02-11: 3 mg via INTRAVENOUS

## 2015-02-11 MED ORDER — ONDANSETRON HCL 4 MG/2ML IJ SOLN
INTRAMUSCULAR | Status: AC
  Filled 2015-02-11: qty 2

## 2015-02-11 MED ORDER — DIPHENHYDRAMINE HCL 12.5 MG/5ML PO ELIX: 12.5000 mg | ORAL_SOLUTION | Freq: Four times a day (QID) | ORAL | Status: DC | PRN

## 2015-02-11 MED ORDER — KETOROLAC TROMETHAMINE 30 MG/ML IJ SOLN
30.0000 mg | Freq: Four times a day (QID) | INTRAMUSCULAR | Status: DC | PRN
  Administered 2015-02-11 – 2015-02-12 (×2): 30 mg via INTRAVENOUS
  Filled 2015-02-11 (×2): qty 1

## 2015-02-11 MED ORDER — OXYCODONE HCL 5 MG PO TABS: 5.0000 mg | ORAL_TABLET | ORAL | Status: DC | PRN

## 2015-02-11 MED ORDER — HEMOSTATIC AGENTS (NO CHARGE) OPTIME
TOPICAL | Status: DC | PRN
  Administered 2015-02-11: 1

## 2015-02-11 MED ORDER — ROCURONIUM BROMIDE 100 MG/10ML IV SOLN
INTRAVENOUS | Status: AC
  Filled 2015-02-11: qty 1

## 2015-02-11 MED ORDER — DIPHENHYDRAMINE HCL 50 MG/ML IJ SOLN: 12.5000 mg | Freq: Four times a day (QID) | INTRAMUSCULAR | Status: DC | PRN

## 2015-02-11 MED ORDER — ACETAMINOPHEN 650 MG RE SUPP
650.0000 mg | RECTAL | Status: DC | PRN
  Filled 2015-02-11: qty 1

## 2015-02-11 MED ORDER — INSULIN GLARGINE 100 UNIT/ML ~~LOC~~ SOLN
24.0000 [IU] | Freq: Every day | SUBCUTANEOUS | Status: DC
  Administered 2015-02-11: 24 [IU] via SUBCUTANEOUS
  Filled 2015-02-11 (×2): qty 0.24

## 2015-02-11 MED ORDER — POTASSIUM CHLORIDE IN NACL 20-0.45 MEQ/L-% IV SOLN
INTRAVENOUS | Status: DC
  Filled 2015-02-11 (×2): qty 1000

## 2015-02-11 MED ORDER — MEPERIDINE HCL 50 MG/ML IJ SOLN: 6.2500 mg | INTRAMUSCULAR | Status: DC | PRN

## 2015-02-11 MED ORDER — DEXAMETHASONE SODIUM PHOSPHATE 10 MG/ML IJ SOLN
INTRAMUSCULAR | Status: AC
  Administered 2015-02-11: 10 mg
  Filled 2015-02-11: qty 1

## 2015-02-11 MED ORDER — LACTATED RINGERS IR SOLN
Status: DC | PRN
  Administered 2015-02-11: 1000 mL

## 2015-02-11 MED ORDER — RACEPINEPHRINE HCL 2.25 % IN NEBU
INHALATION_SOLUTION | RESPIRATORY_TRACT | Status: AC
  Administered 2015-02-11: 0.5 mL
  Filled 2015-02-11: qty 0.5

## 2015-02-11 SURGICAL SUPPLY — 41 items
APPLICATOR ARISTA FLEXITIP XL (MISCELLANEOUS) ×3 IMPLANT
APPLIER CLIP 5 13 M/L LIGAMAX5 (MISCELLANEOUS) ×3
APPLIER CLIP ROT 10 11.4 M/L (STAPLE)
CABLE HIGH FREQUENCY MONO STRZ (ELECTRODE) ×3 IMPLANT
CHLORAPREP W/TINT 26ML (MISCELLANEOUS) ×3 IMPLANT
CLIP APPLIE 5 13 M/L LIGAMAX5 (MISCELLANEOUS) ×1 IMPLANT
CLIP APPLIE ROT 10 11.4 M/L (STAPLE) IMPLANT
COVER MAYO STAND STRL (DRAPES) IMPLANT
COVER SURGICAL LIGHT HANDLE (MISCELLANEOUS) ×3 IMPLANT
DECANTER SPIKE VIAL GLASS SM (MISCELLANEOUS) ×3 IMPLANT
DERMABOND ADVANCED (GAUZE/BANDAGES/DRESSINGS) ×2
DERMABOND ADVANCED .7 DNX12 (GAUZE/BANDAGES/DRESSINGS) ×1 IMPLANT
DRAPE C-ARM 42X120 X-RAY (DRAPES) IMPLANT
DRAPE LAPAROSCOPIC ABDOMINAL (DRAPES) ×3 IMPLANT
ELECT REM PT RETURN 9FT ADLT (ELECTROSURGICAL) ×3
ELECTRODE REM PT RTRN 9FT ADLT (ELECTROSURGICAL) ×1 IMPLANT
GLOVE BIOGEL M STRL SZ7.5 (GLOVE) ×3 IMPLANT
GLOVE BIOGEL PI IND STRL 7.0 (GLOVE) ×1 IMPLANT
GLOVE BIOGEL PI INDICATOR 7.0 (GLOVE) ×2
GOWN STRL REUS W/TWL XL LVL3 (GOWN DISPOSABLE) ×9 IMPLANT
HEMOSTAT ARISTA ABSORB 3G PWDR (MISCELLANEOUS) ×3 IMPLANT
HEMOSTAT SNOW SURGICEL 2X4 (HEMOSTASIS) ×3 IMPLANT
KIT BASIN OR (CUSTOM PROCEDURE TRAY) ×3 IMPLANT
LIQUID BAND (GAUZE/BANDAGES/DRESSINGS) ×3 IMPLANT
MARKER SKIN DUAL TIP RULER LAB (MISCELLANEOUS) ×3 IMPLANT
NS IRRIG 1000ML POUR BTL (IV SOLUTION) ×3 IMPLANT
POUCH RETRIEVAL ECOSAC 10 (ENDOMECHANICALS) ×1 IMPLANT
POUCH RETRIEVAL ECOSAC 10MM (ENDOMECHANICALS) ×2
POUCH SPECIMEN RETRIEVAL 10MM (ENDOMECHANICALS) ×3 IMPLANT
SCISSORS LAP 5X35 DISP (ENDOMECHANICALS) ×3 IMPLANT
SET CHOLANGIOGRAPH MIX (MISCELLANEOUS) IMPLANT
SET IRRIG TUBING LAPAROSCOPIC (IRRIGATION / IRRIGATOR) ×3 IMPLANT
SLEEVE XCEL OPT CAN 5 100 (ENDOMECHANICALS) ×6 IMPLANT
SUT MNCRL AB 4-0 PS2 18 (SUTURE) ×3 IMPLANT
SUT VICRYL 0 UR6 27IN ABS (SUTURE) IMPLANT
TOWEL OR 17X26 10 PK STRL BLUE (TOWEL DISPOSABLE) ×3 IMPLANT
TOWEL OR NON WOVEN STRL DISP B (DISPOSABLE) ×3 IMPLANT
TRAY LAPAROSCOPIC (CUSTOM PROCEDURE TRAY) ×3 IMPLANT
TROCAR BLADELESS OPT 5 100 (ENDOMECHANICALS) ×3 IMPLANT
TROCAR XCEL BLUNT TIP 100MML (ENDOMECHANICALS) ×3 IMPLANT
TROCAR XCEL NON-BLD 11X100MML (ENDOMECHANICALS) IMPLANT

## 2015-02-11 NOTE — Anesthesia Procedure Notes (Signed)
Procedure Name: Intubation Date/Time: 02/11/2015 11:56 AM Performed by: Glory Buff Pre-anesthesia Checklist: Patient identified, Emergency Drugs available, Suction available and Patient being monitored Patient Re-evaluated:Patient Re-evaluated prior to inductionOxygen Delivery Method: Circle System Utilized Preoxygenation: Pre-oxygenation with 100% oxygen Intubation Type: IV induction Ventilation: Mask ventilation without difficulty Laryngoscope Size: Miller and 3 Grade View: Grade I Tube type: Oral Tube size: 7.0 mm Number of attempts: 1 Airway Equipment and Method: Stylet and Oral airway Placement Confirmation: ETT inserted through vocal cords under direct vision,  positive ETCO2 and breath sounds checked- equal and bilateral Secured at: 20 cm Tube secured with: Tape Dental Injury: Teeth and Oropharynx as per pre-operative assessment

## 2015-02-11 NOTE — Discharge Instructions (Signed)
[facility] LAPAROSCOPIC SURGERY: POST OP INSTRUCTIONS Always review your discharge instruction sheet given to you by the facility where your surgery was performed. IF YOU HAVE DISABILITY OR FAMILY LEAVE FORMS, YOU MUST BRING THEM TO THE OFFICE FOR PROCESSING.   DO NOT GIVE THEM TO YOUR DOCTOR.  1. A prescription for pain medication may be given to you upon discharge.  Take your pain medication as prescribed, if needed.  If narcotic pain medicine is not needed, then you may take acetaminophen (Tylenol) or ibuprofen (Advil) as needed. 2. Take your usually prescribed medications unless otherwise directed. 3. If you need a refill on your pain medication, please contact your pharmacy.  They will contact our office to request authorization. Prescriptions will not be filled after 5pm or on week-ends. 4. You should follow a light diet the first few days after arrival home, such as soup and crackers, etc.  Be sure to include lots of fluids daily. 5. Most patients will experience some swelling and bruising in the area of the incisions.  Ice packs will help.  Swelling and bruising can take several days to resolve.  6. It is common to experience some constipation if taking pain medication after surgery.  Increasing fluid intake and taking a stool softener (such as Colace) will usually help or prevent this problem from occurring.  A mild laxative (Milk of Magnesia or Miralax) should be taken according to package instructions if there are no bowel movements after 48 hours. 7.  If your surgeon used skin glue on the incision, you may shower in 24 hours.  The glue will flake off over the next 2-3 weeks.  Any sutures or staples will be removed at the office during your follow-up visit. 8. ACTIVITIES:  You may resume regular (light) daily activities beginning the next day--such as daily self-care, walking, climbing stairs--gradually increasing activities as tolerated.  You may have sexual  intercourse when it is comfortable.  Refrain from any heavy lifting or straining until approved by your doctor. a. You may drive when you are no longer taking prescription pain medication, you can comfortably wear a seatbelt, and you can safely maneuver your car and apply brakes. 9. You should see your doctor in the office for a follow-up appointment approximately 2-3 weeks after your surgery.  Make sure that you call for this appointment within a day or two after you arrive home to insure a convenient appointment time. 10. OTHER INSTRUCTIONS:DO NOT LIFT, PUSH, OR PULL ANYTHING GREATER THAN 10 POUNDS FOR 2 WEEKS  WHEN TO CALL YOUR DOCTOR: 1. Fever over 101.0 2. Inability to urinate 3. Continued bleeding from incision. 4. Increased pain, redness, or drainage from the incision. 5. Increasing abdominal pain  The clinic staff is available to answer your questions during regular business hours.  Please dont hesitate to call and ask to speak to one of the nurses for clinical concerns.  If you have a medical emergency, go to the nearest emergency room or call 911.  A surgeon from [facility] is always on call at the hospital. [address], [place], [place], [place]  [address] ? [address], [place], [place]   [address] [phone] ? [phone] ? FAX [phone] Web site: [email or web address]

## 2015-02-11 NOTE — Progress Notes (Signed)
CCM Marni Griffon at bedside

## 2015-02-11 NOTE — Progress Notes (Signed)
Inpatient Diabetes Program Recommendations  AACE/ADA: New Consensus Statement on Inpatient Glycemic Control (2015)  Target Ranges:  Prepandial:   less than 140 mg/dL      Peak postprandial:   less than 180 mg/dL (1-2 hours)      Critically ill patients:  140 - 180 mg/dL   Review of Glycemic Control  Diabetes history: DM1 Outpatient Diabetes medications: Lantus 24 units QHS, Novolog 0-20 tidwc and hs Current orders for Inpatient glycemic control: Received Novolog 4 units prior to GB surgery  Results for GRACEANNA, MAMULA (MRN [id number]) as of 02/11/2015 10:37  Ref. Range 02/11/2015 09:51  Glucose-Capillary Latest Ref Range: 65-99 mg/dL 339 (H)  Will need close f/u of blood sugars while inpatient.  Hx of DKA. Will need basal insulin + meal coverage and correction. See recs below.  Inpatient Diabetes Program Recommendations:    Novolog sensitive Q4H - when taking po's, change to tidwc and hs Will need meal coverage when diet advanced to CHO mod med - Novolog 4 units tidwc Start Lantus 12 units after surgery. (Took 12 units last night) Please order Lantus 12 units QHS for 1/23. Beginning 1/24, Lantus 24 units QHS.  Will follow while inpatient.  Thank you. Lorenda Peck, RD, LDN, CDE Inpatient Diabetes Coordinator [phone]

## 2015-02-11 NOTE — Progress Notes (Signed)
Dr Redmond Pulling aware of pt with stridor, and treatments given in pacu.  He will consult pulmonology.

## 2015-02-11 NOTE — Op Note (Signed)
[name] [id number] 24-Sep-1990 02/11/2015  Laparoscopic Cholecystectomy with IOC Procedure Note  Indications: This patient presents with symptomatic gallbladder disease and will undergo laparoscopic cholecystectomy.  Pre-operative Diagnosis: symptomatic cholelithiasis, hepatic steatosis  Post-operative Diagnosis: Same  Surgeon: [name]   Assistants: Johnathan Hausen MD FACS  Anesthesia: General endotracheal anesthesia  Procedure Details  The patient was seen again in the Holding Room. The risks, benefits, complications, treatment options, and expected outcomes were discussed with the patient. The possibilities of reaction to medication, pulmonary aspiration, perforation of viscus, bleeding, recurrent infection, finding a normal gallbladder, the need for additional procedures, failure to diagnose a condition, the possible need to convert to an open procedure, and creating a complication requiring transfusion or operation were discussed with the patient. The likelihood of improving the patient's symptoms with return to their baseline status is good.  The patient and/or family concurred with the proposed plan, giving informed consent. The site of surgery properly noted. The patient was taken to Operating Room, identified as [name] and the procedure verified as Laparoscopic Cholecystectomy with Intraoperative Cholangiogram. A Time Out was held and the above information confirmed. Antibiotic prophylaxis was administered.   Prior to the induction of general anesthesia, antibiotic prophylaxis was administered. General endotracheal anesthesia was then administered and tolerated well. After the induction, the abdomen was prepped with Chloraprep and draped in the sterile fashion. The patient was positioned in the supine position.  Local anesthetic agent was injected into the skin near the umbilicus and an incision made. We dissected down to the abdominal fascia with blunt  dissection.  The fascia was incised vertically and we entered the peritoneal cavity bluntly.  A pursestring suture of 0-Vicryl was placed around the fascial opening.  The Hasson cannula was inserted and secured with the stay suture.  Pneumoperitoneum was then created with CO2 and tolerated well without any adverse changes in the patient's vital signs. An 5-mm port was placed in the subxiphoid position.  Two 5-mm ports were placed in the right upper quadrant. All skin incisions were infiltrated with a local anesthetic agent before making the incision and placing the trocars.   The patient had a very enlarged fatty liver. Although her body frame is small, the right lobe of liver went all the way down to the right ASIS. The edge of right liver also came down to the level of the umbilicus. We positioned the patient in reverse Trendelenburg, tilted slightly to the patient's left.  The gallbladder was identified, the fundus grasped and retracted cephalad. Adhesions were lysed bluntly and with the electrocautery where indicated, taking care not to injure any adjacent organs or viscus. The infundibulum was grasped and retracted laterally, exposing the peritoneum overlying the triangle of Calot. This was then divided and exposed in a blunt fashion. A critical view of the cystic duct and cystic artery was obtained.  The cystic duct was clearly identified and bluntly dissected circumferentially. The cystic duct was ligated with a clip distally.   An incision was made in the cystic duct and the [facility] cholangiogram catheter introduced. The catheter was secured using a clip. A cholangiogram was then obtained which showed good visualization of the distal and proximal biliary tree with no sign of filling defects or obstruction.  Contrast flowed easily into the duodenum. The catheter was then removed.   The cystic duct was then ligated with clips and divided. The cystic artery which had been identified & dissected free was  ligated with clips and divided as well.  The gallbladder was dissected from the liver bed in retrograde fashion with the electrocautery. The gallbladder was removed and placed in an Ecco sac.  The gallbladder and Ecco sac were then removed through the umbilical port site. The liver bed was irrigated and inspected. Hemostasis was achieved with the electrocautery. Copious irrigation was utilized and was repeatedly aspirated until clear. Arista was placed into the gallbladder fossa.  The pursestring suture was used to close the umbilical fascia.    We again inspected the right upper quadrant for hemostasis.  The umbilical closure was inspected and there was no air leak and nothing trapped within the closure. Pneumoperitoneum was released as we removed the trocars.  4-0 Monocryl was used to close the skin.  Dermabond was applied. The patient was then extubated and brought to the recovery room in stable condition. Instrument, sponge, and needle counts were correct at closure and at the conclusion of the case.   Findings: Cholelithiasis; +critical view; normal IOC; very fatty liver  Estimated Blood Loss: Minimal         Drains: none         Specimens: Gallbladder           Complications: None; patient tolerated the procedure well.         Disposition: PACU - hemodynamically stable.         Condition: stable  Leighton Ruff. Redmond Pulling, MD, FACS General, Bariatric, & Minimally Invasive Surgery [facility], [place]

## 2015-02-11 NOTE — Consult Note (Signed)
Name: [name] MRN: [id number] DOB: 1990/06/12    ADMISSION DATE:  02/11/2015 CONSULTATION DATE:  1/23  REFERRING MD :  Redmond Pulling   CHIEF COMPLAINT:  Post-op Stridor   BRIEF PATIENT DESCRIPTION:  This is a 25 year old female w/ h/o asthma, GB disease, DM w/ prior admit for DKA and hepatic Steatosis. Underwent laparoscopic Cholecystectomy on 1/23. Intra-op events unremarkable w/ general anesthesia and endotracheal intubation. She was extubated in OR, post-op course notable for inspiratory stridor. She was treated X 2 w/ inhaled racemic epi w/out significant improvement. PCCM was asked to see to assist in evaluation and treatment of stridor.   SIGNIFICANT EVENTS    STUDIES:     HISTORY OF PRESENT ILLNESS:  As above   PAST MEDICAL HISTORY :   has a past medical history of Diabetes mellitus; Asthma; Pancreatitis, acute (11/26/2014); Hepatic steatosis (11/26/2014); Liver mass (11/26/2014); Hypertension; Heart murmur; Arthritis; Gallstones; and Anxiety.  has past surgical history that includes Wisdom tooth extraction and Esophagogastroduodenoscopy (egd) with propofol (Left, 09/20/2014). Prior to Admission medications   Medication Sig Start Date End Date Taking? Authorizing Provider  budesonide-formoterol (SYMBICORT) 160-4.5 MCG/ACT inhaler Inhale 2 puffs into the lungs 2 (two) times daily.   Yes Historical Provider, MD  EPINEPHrine (EPIPEN) 0.3 mg/0.3 mL SOAJ injection Inject 0.3 mg into the muscle once.   Yes Historical Provider, MD  glucagon 1 MG injection Inject 1 mg into the muscle as needed. FOR LOW BLOOD SUGAR 01/31/14 02/06/15 Yes Historical Provider, MD  insulin aspart (NOVOLOG FLEXPEN) 100 UNIT/ML FlexPen INJECT 0-20 UNITS INTO THE SKIN AS DIRECTED. SSI - CARB SCALE, 0-20 UNITS Patient taking differently: Inject into the skin 4 (four) times daily as needed for high blood sugar. Sliding scale 10/19/13  Yes Elayne Snare, MD  Insulin Glargine (LANTUS SOLOSTAR) 100 UNIT/ML Solostar Pen  Inject 36 Units into the skin daily at 10 pm. Patient taking differently: Inject 24 Units into the skin daily at 10 pm.  10/19/13  Yes Elayne Snare, MD  montelukast (SINGULAIR) 10 MG tablet Take 10 mg by mouth daily.   Yes Historical Provider, MD  albuterol (PROVENTIL HFA;VENTOLIN HFA) 108 (90 BASE) MCG/ACT inhaler Inhale 2 puffs into the lungs every 6 (six) hours as needed for shortness of breath.    Historical Provider, MD  albuterol (PROVENTIL) (2.5 MG/3ML) 0.083% nebulizer solution Take 2.5 mg by nebulization every 6 (six) hours as needed for shortness of breath.    Historical Provider, MD  HYDROcodone-acetaminophen (NORCO/VICODIN) 5-325 MG tablet Take 1 tablet by mouth every 4 (four) hours as needed. Patient not taking: Reported on 02/06/2015 12/18/14   Merrily Pew, MD  ondansetron (ZOFRAN) 4 MG tablet Take 1 tablet (4 mg total) by mouth every 8 (eight) hours as needed for nausea or vomiting. Patient not taking: Reported on 02/06/2015 11/29/14   Hosie Poisson, MD  oxyCODONE (OXY IR/ROXICODONE) 5 MG immediate release tablet Take 1-2 tablets (5-10 mg total) by mouth every 4 (four) hours as needed for moderate pain, severe pain or breakthrough pain. 02/11/15   Greer Pickerel, MD  pantoprazole (PROTONIX) 40 MG tablet Take 1 tablet (40 mg total) by mouth daily. Patient not taking: Reported on 11/24/2014 09/21/14   Florencia Reasons, MD   Allergies  Allergen Reactions  . Peanut-Containing Drug Products Swelling    Swelling of mouth, lips  . Strawberry Extract Swelling    Swelling of mouth, lips    FAMILY HISTORY:  family history includes Cancer in her paternal grandfather; Diabetes  in her mother; Heart disease in her father, maternal grandfather, and maternal grandmother; Hyperlipidemia in her mother; Hypertension in her father and paternal grandmother. SOCIAL HISTORY:  reports that she has never smoked. She has never used smokeless tobacco. She reports that she uses illicit drugs (Marijuana). She reports that she  does not drink alcohol.  REVIEW OF SYSTEMS:   Constitutional: Negative for fever, chills, weight loss, malaise/fatigue and diaphoresis.  HENT: Negative for hearing loss, ear pain, nosebleeds, congestion, sore throat, neck pain, tinnitus and ear discharge.   Eyes: Negative for blurred vision, double vision, photophobia, pain, discharge and redness.  Respiratory: Negative for cough, hemoptysis, sputum production, shortness of breath, wheezing and stridor consistent and present w/ phonation as well as at rest.   Cardiovascular: Negative for chest pain, palpitations, orthopnea, claudication, leg swelling and PND.  Gastrointestinal: Negative for heartburn, nausea, vomiting, abdominal pain, diarrhea, constipation, blood in stool and melena.  Genitourinary: Negative for dysuria, urgency, frequency, hematuria and flank pain.  Musculoskeletal: Negative for myalgias, back pain, joint pain and falls.  Skin: Negative for itching and rash.  Neurological: Negative for dizziness, tingling, tremors, sensory change, speech change, focal weakness, seizures, loss of consciousness, weakness and headaches.  Endo/Heme/Allergies: Negative for environmental allergies and polydipsia. Does not bruise/bleed easily.  SUBJECTIVE:  A little short of breath  VITAL SIGNS: Temp:  [97.8 F (36.6 C)-98.5 F (36.9 C)] 98.5 F (36.9 C) (01/23 1311) Pulse Rate:  [96-117] 117 (01/23 1430) Resp:  [12-23] 13 (01/23 1430) BP: (127-146)/(65-100) 135/65 mmHg (01/23 1430) SpO2:  [95 %-100 %] 97 % (01/23 1430) Weight:  [119 lb (53.978 kg)] 119 lb (53.978 kg) (01/23 1013)  PHYSICAL EXAMINATION: General:  Frail 25 yof. Awake, not in distress but does have loud raspy inspiratory stridor  Neuro:  Awake, alert, calm, not anxious. No distress  HEENT:  NCAT. Marked raspy upper airway stridor  Cardiovascular:  rrr w/out MRG  Lungs:  Clear w/ mild accessory muscle use, transmitted upper airway noises  Abdomen:  dressig  CD&I Musculoskeletal:  Intact equal st and bulk Skin:  Warm and dry    Recent Labs Lab 02/06/15 1150  NA 141  K 4.8  CL 104  CO2 27  BUN 13  CREATININE 0.60  GLUCOSE 327*    Recent Labs Lab 02/06/15 1150  HGB 12.5  HCT 37.5  WBC 5.2  PLT 314   Dg Cholangiogram Operative  02/11/2015  CLINICAL DATA:  Cholelithiasis EXAM: INTRAOPERATIVE CHOLANGIOGRAM TECHNIQUE: Cholangiographic images from the C-arm fluoroscopic device were submitted for interpretation post-operatively. Please see the procedural report for the amount of contrast and the fluoroscopy time utilized. COMPARISON:  Ultrasound 12/18/2014 FINDINGS: No persistent filling defects in the common duct. Intrahepatic ducts are incompletely visualized, appearing decompressed centrally. Contrast passes into the duodenum. : Negative for retained common duct stone. Electronically Signed   By: [name] M.D.   On: 02/11/2015 13:49    ASSESSMENT / PLAN:  Post-op stridor Could be a psychosomatic component but fairly convincing  Plan Scheduled decadron  Humidified O2 Monitor in SDU setting Add flutter valve  Asthma. No evidence of acute exacerbation plan symbicort PRN SABA  S/p laparoscopic Cholecystectomy  Plan  Per surgery   DM w/ hyperglycemia  Anticipate decadron w/ complicate this Plan ssi    Erick Colace ACNP-BC [place] Pager # [phone] OR # [phone] if no answer  02/11/2015, 2:59 PM

## 2015-02-11 NOTE — Interval H&P Note (Signed)
History and Physical Interval Note:  02/11/2015 11:19 AM  [name]  has presented today for surgery, with the diagnosis of symptomatic cholelithasis  The various methods of treatment have been discussed with the patient and family. After consideration of risks, benefits and other options for treatment, the patient has consented to  Procedure(s): LAPAROSCOPIC CHOLECYSTECTOMY WITH INTRAOPERATIVE CHOLANGIOGRAM (N/A) as a surgical intervention .  The patient's history has been reviewed, patient examined, no change in status, stable for surgery.  I have reviewed the patient's chart and labs.  Questions were answered to the patient's satisfaction.    Leighton Ruff. Redmond Pulling, MD, [place], Bariatric, & Minimally Invasive Surgery [facility], [place]   [facility]

## 2015-02-11 NOTE — Consult Note (Signed)
Reason for Consult: Stridor  Referring Physician: Pulmonary  [name] is an 25 y.o. female.  HPI: 25 year old who's had no previous issues with stridor awakened from her procedure today with expiratory stridor. She had a relatively short cholecystectomy. She has not really improved her stridor through the day. It's been approximately 6 hours since her procedure. Her stridor has not worsened. She has had no respiratory distress. She's been comfortable without oxygen and no desaturation. Her voice has been slightly raspy and slightly weak..Does have some history of asthma. She is able to swallow liquids. She has had a racemic epinephrine treatment as well as steroids.  Past Medical History  Diagnosis Date  . Diabetes mellitus   . Asthma   . Pancreatitis, acute 11/26/2014  . Hepatic steatosis 11/26/2014  . Liver mass 11/26/2014  . Hypertension     NOT CURRENTLY ON ANY BP MED  . Heart murmur   . Arthritis   . Gallstones   . Anxiety     Past Surgical History  Procedure Laterality Date  . Wisdom tooth extraction    . Esophagogastroduodenoscopy (egd) with propofol Left 09/20/2014    Procedure: ESOPHAGOGASTRODUODENOSCOPY (EGD) WITH PROPOFOL;  Surgeon: Arta Silence, MD;  Location: [facility] ENDOSCOPY;  Service: Endoscopy;  Laterality: Left;  . Cholecystectomy N/A 02/11/2015    Procedure: LAPAROSCOPIC CHOLECYSTECTOMY WITH INTRAOPERATIVE CHOLANGIOGRAM;  Surgeon: Greer Pickerel, MD;  Location: [facility] ORS;  Service: General;  Laterality: N/A;    Family History  Problem Relation Age of Onset  . Diabetes Mother   . Hyperlipidemia Mother   . Hypertension Father   . Heart disease Father   . Heart disease Maternal Grandmother   . Heart disease Maternal Grandfather   . Hypertension Paternal Grandmother   . Cancer Paternal Grandfather     Social History:  reports that she has never smoked. She has never used smokeless tobacco. She reports that she uses illicit drugs (Marijuana). She reports that she  does not drink alcohol.  Allergies:  Allergies  Allergen Reactions  . Peanut-Containing Drug Products Swelling    Swelling of mouth, lips  . Strawberry Extract Swelling    Swelling of mouth, lips    Medications: I have reviewed the patient's current medications.  Results for orders placed or performed during the hospital encounter of 02/11/15 (from the past 48 hour(s))  Glucose, capillary     Status: Abnormal   Collection Time: 02/11/15  9:51 AM  Result Value Ref Range   Glucose-Capillary 339 (H) 65 - 99 mg/dL   Comment 1 Notify RN   Glucose, capillary     Status: Abnormal   Collection Time: 02/11/15 11:29 AM  Result Value Ref Range   Glucose-Capillary 209 (H) 65 - 99 mg/dL   Comment 1 Document in Chart    Comment 2 Call MD NNP PA CNM   Glucose, capillary     Status: Abnormal   Collection Time: 02/11/15  1:16 PM  Result Value Ref Range   Glucose-Capillary 147 (H) 65 - 99 mg/dL  MRSA PCR Screening     Status: None   Collection Time: 02/11/15  3:34 PM  Result Value Ref Range   MRSA by PCR NEGATIVE NEGATIVE    Comment:        The GeneXpert MRSA Assay (FDA approved for NASAL specimens only), is one component of a comprehensive MRSA colonization surveillance program. It is not intended to diagnose MRSA infection nor to guide or monitor treatment for MRSA infections.   Glucose, capillary  Status: Abnormal   Collection Time: 02/11/15  3:47 PM  Result Value Ref Range   Glucose-Capillary 183 (H) 65 - 99 mg/dL    Dg Cholangiogram Operative  02/11/2015  CLINICAL DATA:  Cholelithiasis EXAM: INTRAOPERATIVE CHOLANGIOGRAM TECHNIQUE: Cholangiographic images from the C-arm fluoroscopic device were submitted for interpretation post-operatively. Please see the procedural report for the amount of contrast and the fluoroscopy time utilized. COMPARISON:  Ultrasound 12/18/2014 FINDINGS: No persistent filling defects in the common duct. Intrahepatic ducts are incompletely visualized,  appearing decompressed centrally. Contrast passes into the duodenum. : Negative for retained common duct stone. Electronically Signed   By: [name] M.D.   On: 02/11/2015 13:49    ROS Blood pressure 123/90, pulse 107, temperature 98.5 F (36.9 C), resp. rate 18, height 5\' 4"  (1.626 m), weight 53.978 kg (119 lb), last menstrual period 02/10/2015, SpO2 100 %. Physical Exam  Constitutional: She appears well-developed and well-nourished.  HENT:  Head: Normocephalic and atraumatic.  Nose: Nose normal.  Mouth/Throat: Oropharynx is clear and moist.  Fiberoptic-the nasopharynx, pharynx, hypopharynx are without evidence of any swelling or lesions. Both vocal cords are positioned in the paramedian position and do not seem to abduct well. There is some erythema and slight swelling at the vocal process bilaterally equal. As I watched her phonate she does have some expiratory stridor but then if you ask her to take a deep breath and she does have inspiratory stridor. The cord do not open well with this maneuver. Her false cord seemed to adduct somewhat over the anterior portion of the cord and then periodically through her cycle and as watching for a period of time they open up and at that point her airway gets much better as the entire glottis is exposed. Her voice is raspy.  Eyes: Conjunctivae are normal. Pupils are equal, round, and reactive to light.  Neck: Normal range of motion. Neck supple.    Assessment/Plan: Stridor-the fiberoptic exam does indicate the endotracheal tube created some erythema and edema. She also has some vocal cord dysfunction which is contributing to the stridor as it is not entirely the swelling that is creating the narrowing. It is not clear exactly the etiology of this vocal cord dysfunction. I would continue her on the Decadron. A benzodiazepine possibly could be helpful but I would not try that until tomorrow based on whether she still having the problem. Since she is having no  desaturation or air hunger observation is the treatment of choice at this time.  Melissa Montane 02/11/2015, 6:22 PM

## 2015-02-11 NOTE — H&P (View-Only) (Signed)
[name] 01/04/2015 8:40 AM Location: [facility] Patient #: [id number] DOB: 07/25/1990 Single / Language: [name] / Race: Black or African American Female   History of Present Illness Randall Hiss M. Delorise Hunkele MD; 01/04/2015 9:16 AM) The patient is a 25 year old female who presents for evaluation of gallbladder disease. She is referred by Dr. Ouida Sills to discuss gallbladder issues. She was hospitalized in early November for pancreatitis. She was found to have gallstones. She was also admitted in DKA. She had nausea and vomiting as her primary complaints. She ended up back in the emergency room at the end of November with epigastric and right upper quadrant pain. She's undergone an exhaustive workup including ultrasound, CT, and MRI. She was also found to have hepatic steatosis. As well as four masses in the liver. On MRI these masses appeared to reflect benign hepatic adenomas. Follow-up MRI was recommended in 6 months. In late November when she came back to the emergency room with epigastric and right upper quadrant pain she had a mildly elevated AST at 42 and an elevated alkaline phosphatase at 130. She denies any current belly pain. She denies any fevers or chills. She denies any current nausea or vomiting. She reports that her sugars are doing well. She denies any tobacco.   Problem List/Past Medical [name], MD; 01/04/2015 9:19 AM) HEPATIC STEATOSIS (K76.0) HEPATIC ADENOMA (D13.4) SYMPTOMATIC CHOLELITHIASIS (K80.20)  Other Problems [name], MD; 01/04/2015 9:19 AM) Anxiety Disorder Asthma Cholelithiasis Diabetes Mellitus Heart murmur Pancreatitis  Past Surgical History Elbert Ewings, [place]; 01/04/2015 8:40 AM) Oral Surgery  Diagnostic Studies History Elbert Ewings, CMA; 01/04/2015 8:40 AM) Colonoscopy never Mammogram never Pap Smear 1-5 years ago  Allergies Elbert Ewings, CMA; 01/04/2015 8:40 AM) Peanuts  Medication History  Elbert Ewings, CMA; 01/04/2015 8:42 AM) Albuterol Sulfate ((2.5 MG/3ML)0.083% Nebulized Soln, Inhalation) Active. Hydrocodone-Acetaminophen (5-325MG  Tablet, Oral) Active. Lantus SoloStar (100UNIT/ML Soln Pen-inj, Subcutaneous) Active. NovoLOG FlexPen (100UNIT/ML Soln Pen-inj, Subcutaneous) Active. Pantoprazole Sodium (40MG  Tablet DR, Oral) Active. Montelukast Sodium (10MG  Tablet, Oral) Active. Ondansetron (4MG  Tablet, Oral) Active. Glucagon (1MG  For Solution, Injection) Active. Medications Reconciled  Social History Elbert Ewings, CMA; 01/04/2015 8:40 AM) Alcohol use Occasional alcohol use. No caffeine use No drug use Tobacco use Never smoker.  Family History Elbert Ewings, [place]; 01/04/2015 8:40 AM) Diabetes Mellitus Mother. Hypertension Father.  Pregnancy / Birth History Elbert Ewings, CMA; 01/04/2015 8:40 AM) Age at menarche 53 years. Contraceptive History Oral contraceptives. Gravida 2 Maternal age 27-25 Para 1 Regular periods    Review of Systems Elbert Ewings CMA; 01/04/2015 8:40 AM) General Present- Chills and Night Sweats. Not Present- Appetite Loss, Fatigue, Fever, Weight Gain and Weight Loss. Skin Not Present- Change in Wart/Mole, Dryness, Hives, Jaundice, New Lesions, Non-Healing Wounds, Rash and Ulcer. HEENT Present- Seasonal Allergies. Not Present- Earache, Hearing Loss, Hoarseness, Nose Bleed, Oral Ulcers, Ringing in the Ears, Sinus Pain, Sore Throat, Visual Disturbances, Wears glasses/contact lenses and Yellow Eyes. Respiratory Not Present- Bloody sputum, Chronic Cough, Difficulty Breathing, Snoring and Wheezing. Breast Not Present- Breast Mass, Breast Pain, Nipple Discharge and Skin Changes. Cardiovascular Not Present- Chest Pain, Difficulty Breathing Lying Down, Leg Cramps, Palpitations, Rapid Heart Rate, Shortness of Breath and Swelling of Extremities. Gastrointestinal Present- Abdominal Pain. Not Present- Bloating, Bloody Stool, Change in Bowel  Habits, Chronic diarrhea, Constipation, Difficulty Swallowing, Excessive gas, Gets full quickly at meals, Hemorrhoids, Indigestion, Nausea, Rectal Pain and Vomiting. Female Genitourinary Not Present- Frequency, Nocturia, Painful Urination, Pelvic Pain and Urgency. Musculoskeletal Not Present- Back Pain,  Joint Pain, Joint Stiffness, Muscle Pain, Muscle Weakness and Swelling of Extremities. Neurological Not Present- Decreased Memory, Fainting, Headaches, Numbness, Seizures, Tingling, Tremor, Trouble walking and Weakness. Psychiatric Present- Anxiety. Not Present- Bipolar, Change in Sleep Pattern, Depression, Fearful and Frequent crying. Endocrine Not Present- Cold Intolerance, Excessive Hunger, Hair Changes, Heat Intolerance, Hot flashes and New Diabetes. Hematology Not Present- Easy Bruising, Excessive bleeding, Gland problems, HIV and Persistent Infections.  Vitals Elbert Ewings CMA; 01/04/2015 8:42 AM) 01/04/2015 8:42 AM Weight: 118 lb Height: 64in Body Surface Area: 1.56 m Body Mass Index: 20.25 kg/m  Temp.: 99.80F(Temporal)  Pulse: 96 (Regular)  BP: 130/68 (Sitting, Left Arm, Standard)       Physical Exam Randall Hiss M. Amando Ishikawa MD; 01/04/2015 9:07 AM) General Mental Status-Alert. General Appearance-Consistent with stated age. Hydration-Well hydrated. Voice-Normal.  Head and Neck Head-normocephalic, atraumatic with no lesions or palpable masses. Trachea-midline. Thyroid Gland Characteristics - normal size and consistency.  Eye Eyeball - Bilateral-Extraocular movements intact. Sclera/Conjunctiva - Bilateral-No scleral icterus.  Chest and Lung Exam Chest and lung exam reveals -quiet, even and easy respiratory effort with no use of accessory muscles and on auscultation, normal breath sounds, no adventitious sounds and normal vocal resonance. Inspection Chest Wall - Normal. Back - normal.  Breast - Did not examine.  Cardiovascular Cardiovascular  examination reveals -normal heart sounds, regular rate and rhythm with no murmurs and normal pedal pulses bilaterally.  Abdomen Inspection Inspection of the abdomen reveals - No Hernias. Skin - Scar - no surgical scars. Palpation/Percussion Palpation and Percussion of the abdomen reveal - Soft, Non Tender, No Rebound tenderness, No Rigidity (guarding) and No hepatosplenomegaly. Auscultation Auscultation of the abdomen reveals - Bowel sounds normal.  Peripheral Vascular Upper Extremity Palpation - Pulses bilaterally normal.  Neurologic Neurologic evaluation reveals -alert and oriented x 3 with no impairment of recent or remote memory. Mental Status-Normal.  Neuropsychiatric The patient's mood and affect are described as -normal. Judgment and Insight-insight is appropriate concerning matters relevant to self.  Musculoskeletal Normal Exam - Left-Upper Extremity Strength Normal and Lower Extremity Strength Normal. Normal Exam - Right-Upper Extremity Strength Normal and Lower Extremity Strength Normal.  Lymphatic Head & Neck  General Head & Neck Lymphatics: Bilateral - Description - Normal. Axillary - Did not examine. Femoral & Inguinal - Did not examine.    Assessment & Plan Randall Hiss M. Kelsi Benham MD; 01/04/2015 9:19 AM) SYMPTOMATIC CHOLELITHIASIS (K80.20) Impression: I believe some of the patient's symptoms are consistent with gallbladder disease.  We discussed gallbladder disease. The patient was given Neurosurgeon. We discussed non-operative and operative management. We discussed the signs & symptoms of acute cholecystitis  I discussed laparoscopic cholecystectomy with IOC in detail. The patient was given educational material as well as diagrams detailing the procedure. We discussed the risks and benefits of a laparoscopic cholecystectomy including, but not limited to bleeding, infection, injury to surrounding structures such as the intestine or liver, bile  leak, retained gallstones, need to convert to an open procedure, prolonged diarrhea, blood clots such as DVT, common bile duct injury, anesthesia risks, and possible need for additional procedures. We discussed the typical post-operative recovery course. I explained that the likelihood of improvement of their symptoms is fair to good.  The patient has elected to proceed with surgery. Current Plans Pt Education - Pamphlet Given - Laparoscopic Gallbladder Surgery: discussed with patient and provided information. You are being scheduled for surgery - Our schedulers will call you.  You should hear from our office's scheduling department within 5 working  days about the location, date, and time of surgery. We try to make accommodations for patient's preferences in scheduling surgery, but sometimes the OR schedule or the surgeon's schedule prevents [place] from making those accommodations.  If you have not heard from our office [phone]) in 5 working days, call the office and ask for your surgeon's nurse.  If you have other questions about your diagnosis, plan, or surgery, call the office and ask for your surgeon's nurse.  HEPATIC STEATOSIS (K76.0) Impression: It is unclear the etiology of her hepatic steatosis given the fact that she is not morbidly obese. I have recommended that she probably needs to get established with the [facility] liver program in the area to monitor and workup her hepatic steatosis. I did explain that her mildly elevated LFTs in the past may not be normalized after cholecystectomy. They may be in fact due to her hepatic steatosis. 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 We'll defer this to her primary care physician  Leighton Ruff. Redmond Pulling, MD, FACS General, Bariatric, & Minimally Invasive Surgery [facility], [place]

## 2015-02-11 NOTE — Plan of Care (Signed)
Problem: Safety: Goal: Ability to remain free from injury will improve Outcome: Progressing Patient's bed is in the lowest position.  Call bell is within reach of patient. Bed alarm is on. Education provided about using the call bell.

## 2015-02-11 NOTE — Transfer of Care (Signed)
Immediate Anesthesia Transfer of Care Note  [name]  Procedure(s) Performed: Procedure(s): LAPAROSCOPIC CHOLECYSTECTOMY WITH INTRAOPERATIVE CHOLANGIOGRAM (N/A)  Patient Location: PACU  Anesthesia Type:General  Level of Consciousness: awake, alert  and oriented  Airway & Oxygen Therapy: Patient Spontanous Breathing and Patient connected to face mask oxygen  Post-op Assessment: Report given to RN and Post -op Vital signs reviewed and stable  Post vital signs: Reviewed and stable  Last Vitals:  Filed Vitals:   02/11/15 1024  BP: 127/79  Pulse: 110  Temp: 36.6 C  Resp: 16    Complications: No apparent anesthesia complications

## 2015-02-11 NOTE — Anesthesia Postprocedure Evaluation (Signed)
Anesthesia Post Note  [name]  Procedure(s) Performed: Procedure(s) (LRB): LAPAROSCOPIC CHOLECYSTECTOMY WITH INTRAOPERATIVE CHOLANGIOGRAM (N/A)  Patient location during evaluation: PACU Anesthesia Type: General Level of consciousness: awake, awake and alert and oriented Pain management: pain level controlled Vital Signs Assessment: post-procedure vital signs reviewed and stable Respiratory status: spontaneous breathing and patient connected to face mask oxygen Cardiovascular status: stable Postop Assessment: no headache and no backache Comments: Patient appears to have stridor.  Intubation was not traumatic.  Initially improved with inhaled epi treatment.  Lungs actually sound clear.  Her stridor has now returned, inspiratory only.  She does want to spend the night rather than go home.  Pulmonology has been asked to see her.  I am not convinced that there is not an emotional component to this issue.    Last Vitals:  Filed Vitals:   02/11/15 1415 02/11/15 1430  BP: 133/81 135/65  Pulse: 113 117  Temp:    Resp: 15 13    Last Pain:  Filed Vitals:   02/11/15 1444  PainSc: 5                  [name]

## 2015-02-11 NOTE — Progress Notes (Signed)
Decadron 10 mg given per CCM order.

## 2015-02-12 LAB — COMPREHENSIVE METABOLIC PANEL
ALT: 58 U/L — AB (ref 14–54)
AST: 140 U/L — AB (ref 15–41)
Albumin: 3.2 g/dL — ABNORMAL LOW (ref 3.5–5.0)
Alkaline Phosphatase: 71 U/L (ref 38–126)
Anion gap: 12 (ref 5–15)
BUN: 11 mg/dL (ref 6–20)
CHLORIDE: 104 mmol/L (ref 101–111)
CO2: 21 mmol/L — AB (ref 22–32)
CREATININE: 0.57 mg/dL (ref 0.44–1.00)
Calcium: 9.2 mg/dL (ref 8.9–10.3)
Glucose, Bld: 258 mg/dL — ABNORMAL HIGH (ref 65–99)
POTASSIUM: 4.5 mmol/L (ref 3.5–5.1)
SODIUM: 137 mmol/L (ref 135–145)
Total Bilirubin: 0.5 mg/dL (ref 0.3–1.2)
Total Protein: 6.4 g/dL — ABNORMAL LOW (ref 6.5–8.1)

## 2015-02-12 LAB — CBC
HEMATOCRIT: 36.3 % (ref 36.0–46.0)
Hemoglobin: 11.7 g/dL — ABNORMAL LOW (ref 12.0–15.0)
MCH: 30.3 pg (ref 26.0–34.0)
MCHC: 32.2 g/dL (ref 30.0–36.0)
MCV: 94 fL (ref 78.0–100.0)
PLATELETS: 324 10*3/uL (ref 150–400)
RBC: 3.86 MIL/uL — AB (ref 3.87–5.11)
RDW: 13 % (ref 11.5–15.5)
WBC: 7.1 10*3/uL (ref 4.0–10.5)

## 2015-02-12 LAB — GLUCOSE, CAPILLARY
GLUCOSE-CAPILLARY: 304 mg/dL — AB (ref 65–99)
GLUCOSE-CAPILLARY: 382 mg/dL — AB (ref 65–99)

## 2015-02-12 MED ORDER — INSULIN ASPART 100 UNIT/ML ~~LOC~~ SOLN
3.0000 [IU] | Freq: Once | SUBCUTANEOUS | Status: AC
  Administered 2015-02-12: 3 [IU] via SUBCUTANEOUS

## 2015-02-12 NOTE — Progress Notes (Signed)
1 Day Post-Op  Subjective: Some soreness but controlled with pain meds. No n/v. Had crackers overnight. No sob. Voice sounds ok.   Objective: Vital signs in last 24 hours: Temp:  [97.8 F (36.6 C)-98.5 F (36.9 C)] 98.4 F (36.9 C) (01/24 0341) Pulse Rate:  [88-122] 91 (01/24 0600) Resp:  [12-26] 15 (01/24 0600) BP: (118-146)/(65-100) 135/94 mmHg (01/24 0600) SpO2:  [95 %-100 %] 99 % (01/24 0600) Weight:  [53.978 kg (119 lb)] 53.978 kg (119 lb) (01/23 1013) Last BM Date: 02/10/15  Intake/Output from previous day: 01/23 0701 - 01/24 0700 In: 1504.2 [I.V.:1504.2] Out: -  Intake/Output this shift:    Alert, talking on cell phone Symmetric chest rise, nonlabored Reg Soft, nd, incisions c/d/i; approp mild TTP No edema  Lab Results:   Recent Labs  02/12/15 0344  WBC 7.1  HGB 11.7*  HCT 36.3  PLT 324   BMET  Recent Labs  02/12/15 0344  NA 137  K 4.5  CL 104  CO2 21*  GLUCOSE 258*  BUN 11  CREATININE 0.57  CALCIUM 9.2   PT/INR No results for input(s): LABPROT, INR in the last 72 hours. ABG No results for input(s): PHART, HCO3 in the last 72 hours.  Invalid input(s): PCO2, PO2  Studies/Results: Dg Cholangiogram Operative  02/11/2015  CLINICAL DATA:  Cholelithiasis EXAM: INTRAOPERATIVE CHOLANGIOGRAM TECHNIQUE: Cholangiographic images from the C-arm fluoroscopic device were submitted for interpretation post-operatively. Please see the procedural report for the amount of contrast and the fluoroscopy time utilized. COMPARISON:  Ultrasound 12/18/2014 FINDINGS: No persistent filling defects in the common duct. Intrahepatic ducts are incompletely visualized, appearing decompressed centrally. Contrast passes into the duodenum. : Negative for retained common duct stone. Electronically Signed   By: [name] M.D.   On: 02/11/2015 13:49    Anti-infectives: Anti-infectives    Start     Dose/Rate Route Frequency Ordered Stop   02/11/15 0952  cefoTEtan in Dextrose 5%  (CEFOTAN) IVPB 2 g     2 g Intravenous On call to O.R. 02/11/15 0952 02/11/15 1157      Assessment/Plan: s/p Procedure(s): LAPAROSCOPIC CHOLECYSTECTOMY WITH INTRAOPERATIVE CHOLANGIOGRAM (N/A) Postop stridor  Appreciate CCM and ENT assist.  Doing well from my perspective Adv to carb modified diet Oob, pulm toilet Chemical vte prophylaxis Plan to discharge later today once cleared by CCM and ENT Discussed dc instructions with pt  Leighton Ruff. Redmond Pulling, MD, FACS General, Bariatric, & Minimally Invasive Surgery [facility], [place]   LOS: 1 day    [name] 02/12/2015

## 2015-02-12 NOTE — Plan of Care (Signed)
Problem: Tissue Perfusion: Goal: Risk factors for ineffective tissue perfusion will decrease Outcome: Progressing Risk factors for VTE have been assessed.  Measures are being taken to prevent VTE.  Those measures include SQ Heparin, and bilateral SCDs.

## 2015-02-12 NOTE — Progress Notes (Signed)
Dr Redmond Pulling notifed of cbg 382 .3units novolog insulin given.

## 2015-02-12 NOTE — Consult Note (Signed)
Name: [name] MRN: [id number] DOB: 04-26-1990    ADMISSION DATE:  02/11/2015 CONSULTATION DATE:  1/23  REFERRING MD :  Redmond Pulling   CHIEF COMPLAINT:  Post-op Stridor   BRIEF PATIENT DESCRIPTION:  This is a 25 year old female w/ h/o asthma, GB disease, DM w/ prior admit for DKA and hepatic Steatosis. Underwent laparoscopic Cholecystectomy on 1/23. Intra-op events unremarkable w/ general anesthesia and endotracheal intubation. She was extubated in OR, post-op course notable for inspiratory stridor. She was treated X 2 w/ inhaled racemic epi w/out significant improvement. PCCM was asked to see to assist in evaluation and treatment of stridor.   SIGNIFICANT EVENTS    STUDIES:     HISTORY OF PRESENT ILLNESS:  As above   PAST MEDICAL HISTORY :   has a past medical history of Diabetes mellitus; Asthma; Pancreatitis, acute (11/26/2014); Hepatic steatosis (11/26/2014); Liver mass (11/26/2014); Hypertension; Heart murmur; Arthritis; Gallstones; and Anxiety.  has past surgical history that includes Wisdom tooth extraction; Esophagogastroduodenoscopy (egd) with propofol (Left, 09/20/2014); and Cholecystectomy (N/A, 02/11/2015). Prior to Admission medications   Medication Sig Start Date End Date Taking? Authorizing Provider  budesonide-formoterol (SYMBICORT) 160-4.5 MCG/ACT inhaler Inhale 2 puffs into the lungs 2 (two) times daily.   Yes Historical Provider, MD  EPINEPHrine (EPIPEN) 0.3 mg/0.3 mL SOAJ injection Inject 0.3 mg into the muscle once.   Yes Historical Provider, MD  glucagon 1 MG injection Inject 1 mg into the muscle as needed. FOR LOW BLOOD SUGAR 01/31/14 02/06/15 Yes Historical Provider, MD  insulin aspart (NOVOLOG FLEXPEN) 100 UNIT/ML FlexPen INJECT 0-20 UNITS INTO THE SKIN AS DIRECTED. SSI - CARB SCALE, 0-20 UNITS Patient taking differently: Inject into the skin 4 (four) times daily as needed for high blood sugar. Sliding scale 10/19/13  Yes Elayne Snare, MD  Insulin Glargine (LANTUS  SOLOSTAR) 100 UNIT/ML Solostar Pen Inject 36 Units into the skin daily at 10 pm. Patient taking differently: Inject 24 Units into the skin daily at 10 pm.  10/19/13  Yes Elayne Snare, MD  montelukast (SINGULAIR) 10 MG tablet Take 10 mg by mouth daily.   Yes Historical Provider, MD  albuterol (PROVENTIL HFA;VENTOLIN HFA) 108 (90 BASE) MCG/ACT inhaler Inhale 2 puffs into the lungs every 6 (six) hours as needed for shortness of breath.    Historical Provider, MD  albuterol (PROVENTIL) (2.5 MG/3ML) 0.083% nebulizer solution Take 2.5 mg by nebulization every 6 (six) hours as needed for shortness of breath.    Historical Provider, MD  HYDROcodone-acetaminophen (NORCO/VICODIN) 5-325 MG tablet Take 1 tablet by mouth every 4 (four) hours as needed. Patient not taking: Reported on 02/06/2015 12/18/14   Merrily Pew, MD  ondansetron (ZOFRAN) 4 MG tablet Take 1 tablet (4 mg total) by mouth every 8 (eight) hours as needed for nausea or vomiting. Patient not taking: Reported on 02/06/2015 11/29/14   Hosie Poisson, MD  oxyCODONE (OXY IR/ROXICODONE) 5 MG immediate release tablet Take 1-2 tablets (5-10 mg total) by mouth every 4 (four) hours as needed for moderate pain, severe pain or breakthrough pain. 02/11/15   Greer Pickerel, MD  pantoprazole (PROTONIX) 40 MG tablet Take 1 tablet (40 mg total) by mouth daily. Patient not taking: Reported on 11/24/2014 09/21/14   Florencia Reasons, MD   Allergies  Allergen Reactions  . Peanut-Containing Drug Products Swelling    Swelling of mouth, lips  . Strawberry Extract Swelling    Swelling of mouth, lips    FAMILY HISTORY:  family history includes Cancer in her  paternal grandfather; Diabetes in her mother; Heart disease in her father, maternal grandfather, and maternal grandmother; Hyperlipidemia in her mother; Hypertension in her father and paternal grandmother. SOCIAL HISTORY:  reports that she has never smoked. She has never used smokeless tobacco. She reports that she uses illicit drugs  (Marijuana). She reports that she does not drink alcohol.  REVIEW OF SYSTEMS:   Constitutional: Negative for fever, chills, weight loss, malaise/fatigue and diaphoresis.  HENT: Negative for hearing loss, ear pain, nosebleeds, congestion, sore throat, neck pain, tinnitus and ear discharge.   Eyes: Negative for blurred vision, double vision, photophobia, pain, discharge and redness.  Respiratory: Negative for cough, hemoptysis, sputum production, Denies shortness of breath, No No wheezing or stridor  Cardiovascular: Negative for chest pain, palpitations, orthopnea, claudication, leg swelling and PND.  Gastrointestinal: Negative for heartburn, nausea, vomiting, abdominal pain, diarrhea, constipation, blood in stool and melena.  Genitourinary: Negative for dysuria, urgency, frequency, hematuria and flank pain.  Musculoskeletal: Negative for myalgias, back pain, joint pain and falls.  Skin: Negative for itching and rash.  Neurological: Negative for dizziness, tingling, tremors, sensory change, speech change, focal weakness, seizures, loss of consciousness, weakness and headaches.  Endo/Heme/Allergies: Negative for environmental allergies and polydipsia. Does not bruise/bleed easily.  SUBJECTIVE:  A little short of breath  VITAL SIGNS: Temp:  [97.8 F (36.6 C)-98.8 F (37.1 C)] 98.8 F (37.1 C) (01/24 0800) Pulse Rate:  [88-122] 98 (01/24 0800) Resp:  [12-26] 21 (01/24 0800) BP: (118-146)/(65-100) 133/92 mmHg (01/24 0800) SpO2:  [95 %-100 %] 100 % (01/24 0800) Weight:  [119 lb (53.978 kg)] 119 lb (53.978 kg) (01/23 1013)  PHYSICAL EXAMINATION: General:  No distress, awake oriented Neuro:  No focal deficits,  HEENT:  Moist mucous membranes, No thyromegaly or JVD, no stridor. Cardiovascular:  RRR, no MRG Lungs:  Clear. No wheeze orcrackles. Abdomen:  Dressing clean Skin:  Warm and dry, intact.   Recent Labs Lab 02/06/15 1150 02/12/15 0344  NA 141 137  K 4.8 4.5  CL 104 104  CO2  27 21*  BUN 13 11  CREATININE 0.60 0.57  GLUCOSE 327* 258*    Recent Labs Lab 02/06/15 1150 02/12/15 0344  HGB 12.5 11.7*  HCT 37.5 36.3  WBC 5.2 7.1  PLT 314 324   Dg Cholangiogram Operative  02/11/2015  CLINICAL DATA:  Cholelithiasis EXAM: INTRAOPERATIVE CHOLANGIOGRAM TECHNIQUE: Cholangiographic images from the C-arm fluoroscopic device were submitted for interpretation post-operatively. Please see the procedural report for the amount of contrast and the fluoroscopy time utilized. COMPARISON:  Ultrasound 12/18/2014 FINDINGS: No persistent filling defects in the common duct. Intrahepatic ducts are incompletely visualized, appearing decompressed centrally. Contrast passes into the duodenum. : Negative for retained common duct stone. Electronically Signed   By: [name] M.D.   On: 02/11/2015 13:49    ASSESSMENT / PLAN:  Post-op stridor Vocal cord edema, dysfunction Feeling better today Plan Continue Decadron. Will defer to ENT regarding the duration of therapy. Will likely need ENT follow up.  Humidified O2  Asthma. No evidence of acute exacerbation plan Symbicort PRN SABA  S/p laparoscopic Cholecystectomy  Plan  Per surgery   DM w/ hyperglycemia  Anticipate decadron w/ complicate this Plan SSI  She appears stable with marked improvement in stridor.   PCCM will sign off. Please call back as needed.   Marshell Garfinkel MD [facility] Pager [phone] If no answer or after 3pm call: [phone] 02/12/2015, 9:08 AM

## 2015-02-13 LAB — HEMOGLOBIN A1C
Hgb A1c MFr Bld: 12.2 % — ABNORMAL HIGH (ref 4.8–5.6)
Mean Plasma Glucose: 303 mg/dL

## 2015-02-14 ENCOUNTER — Encounter (HOSPITAL_COMMUNITY): Payer: Self-pay | Admitting: General Surgery

## 2015-02-17 NOTE — Discharge Summary (Signed)
Physician Discharge Summary  [name] [id number] DOB: [birth year] DOA: 02/11/2015  PCP: Vicenta Aly, FNP  Admit date: 02/11/2015 Discharge date: 02/12/2015  Recommendations for Outpatient Follow-up:   Follow-up Information    Follow up with [name], MD. Call in 3 weeks.   Specialty:  General Surgery   Why:  For wound re-check   Contact information:   [name] [address] [phone]      Discharge Diagnoses:  Symptomatic cholelithiasis Type 1 diabetes mellitus Fatty liver   Status post laparoscopic cholecystectomy   Postextubation stridor  Surgical Procedure: laparoscopic cholecystectomy with ioc  Discharge Condition: good Disposition: home  Diet recommendation: diabetic  Filed Weights   02/11/15 1013  Weight: 53.978 kg (119 lb)   Hospital Course:  She came in for planned lap cholecystectomy for symptomatic cholelithiasis. Her preop imaging also suggested a fatty liver. This was confirmed intraoperative. She had a very enlarged fatty liver. In pacu, she developed stridor. It was treated with racemic epi. She had no voice or SOB issues. But given the stridor I admitted her overnight for monitoring. Pulmonary was consulted and recommended monitoring in stepdown. She was also evaluated with Dr [name] of ENT. The following day she was doing well. She was breathing well without oxygen. She was tolerating a diet. Her vitals were stable. Her stridor had resolved. Pulmonary and ENT were ok with her being discharged. I discussed dc instructions.    Discharge Instructions  Discharge Instructions    Call MD for:  difficulty breathing, headache or visual disturbances    Complete by:  As directed      Call MD for:  persistant dizziness or light-headedness    Complete by:  As directed      Call MD for:  persistant nausea and vomiting    Complete by:  As directed      Call MD for:  redness, tenderness, or signs of infection (pain,  swelling, redness, odor or green/yellow discharge around incision site)    Complete by:  As directed      Call MD for:  severe uncontrolled pain    Complete by:  As directed      Call MD for:    Complete by:  As directed   Temp >101     Diet Carb Modified    Complete by:  As directed      Increase activity slowly    Complete by:  As directed             Medication List    STOP taking these medications        HYDROcodone-acetaminophen 5-325 MG tablet  Commonly known as:  NORCO/VICODIN     ondansetron 4 MG tablet  Commonly known as:  ZOFRAN     pantoprazole 40 MG tablet  Commonly known as:  PROTONIX      TAKE these medications        albuterol 108 (90 Base) MCG/ACT inhaler  Commonly known as:  PROVENTIL HFA;VENTOLIN HFA  Inhale 2 puffs into the lungs every 6 (six) hours as needed for shortness of breath.     albuterol (2.5 MG/3ML) 0.083% nebulizer solution  Commonly known as:  PROVENTIL  Take 2.5 mg by nebulization every 6 (six) hours as needed for shortness of breath.     budesonide-formoterol 160-4.5 MCG/ACT inhaler  Commonly known as:  SYMBICORT  Inhale 2 puffs into the lungs 2 (two) times daily.     EPIPEN 0.3 mg/0.3 mL  Soaj injection  Generic drug:  EPINEPHrine  Inject 0.3 mg into the muscle once.     glucagon 1 MG injection  Inject 1 mg into the muscle as needed. FOR LOW BLOOD SUGAR     insulin aspart 100 UNIT/ML FlexPen  Commonly known as:  NOVOLOG FLEXPEN  INJECT 0-20 UNITS INTO THE SKIN AS DIRECTED. SSI - CARB SCALE, 0-20 UNITS     Insulin Glargine 100 UNIT/ML Solostar Pen  Commonly known as:  LANTUS SOLOSTAR  Inject 36 Units into the skin daily at 10 pm.     montelukast 10 MG tablet  Commonly known as:  SINGULAIR  Take 10 mg by mouth daily.     oxyCODONE 5 MG immediate release tablet  Commonly known as:  Oxy IR/ROXICODONE  Take 1-2 tablets (5-10 mg total) by mouth every 4 (four) hours as needed for moderate pain, severe pain or breakthrough  pain.           Follow-up Information    Follow up with [name], MD. Call in 3 weeks.   Specialty:  General Surgery   Why:  For wound re-check   Contact information:   [address] [place] [address] [phone]        The results of significant diagnostics from this hospitalization (including imaging, microbiology, ancillary and laboratory) are listed below for reference.    Significant Diagnostic Studies: Dg Cholangiogram Operative  02/11/2015  CLINICAL DATA:  Cholelithiasis EXAM: INTRAOPERATIVE CHOLANGIOGRAM TECHNIQUE: Cholangiographic images from the C-arm fluoroscopic device were submitted for interpretation post-operatively. Please see the procedural report for the amount of contrast and the fluoroscopy time utilized. COMPARISON:  Ultrasound 12/18/2014 FINDINGS: No persistent filling defects in the common duct. Intrahepatic ducts are incompletely visualized, appearing decompressed centrally. Contrast passes into the duodenum. : Negative for retained common duct stone. Electronically Signed   By: [name] M.D.   On: 02/11/2015 13:49    Microbiology: Recent Results (from the past 240 hour(s))  MRSA PCR Screening     Status: None   Collection Time: 02/11/15  3:34 PM  Result Value Ref Range Status   MRSA by PCR NEGATIVE NEGATIVE Final    Comment:        The GeneXpert MRSA Assay (FDA approved for NASAL specimens only), is one component of a comprehensive MRSA colonization surveillance program. It is not intended to diagnose MRSA infection nor to guide or monitor treatment for MRSA infections.      Labs: Basic Metabolic Panel:  Recent Labs Lab 02/12/15 0344  NA 137  K 4.5  CL 104  CO2 21*  GLUCOSE 258*  BUN 11  CREATININE 0.57  CALCIUM 9.2   Liver Function Tests:  Recent Labs Lab 02/12/15 0344  AST 140*  ALT 58*  ALKPHOS 71  BILITOT 0.5  PROT 6.4*  ALBUMIN 3.2*   No results for input(s): LIPASE, AMYLASE in the last 168  hours. No results for input(s): AMMONIA in the last 168 hours. CBC:  Recent Labs Lab 02/12/15 0344  WBC 7.1  HGB 11.7*  HCT 36.3  MCV 94.0  PLT 324   Cardiac Enzymes: No results for input(s): CKTOTAL, CKMB, CKMBINDEX, TROPONINI in the last 168 hours. BNP: BNP (last 3 results) No results for input(s): BNP in the last 8760 hours.  ProBNP (last 3 results) No results for input(s): PROBNP in the last 8760 hours.  CBG:  Recent Labs Lab 02/11/15 1316 02/11/15 1547 02/11/15 2116 02/12/15 0844 02/12/15 1214  GLUCAP 147* 183* 136* 304* 382*    Active Problems:   S/P laparoscopic cholecystectomy   Status post laparoscopic cholecystectomy   Postextubation stridor   Time coordinating discharge: 10 minutes  Signed:  [name], MD [facility], [place] [phone] 02/17/2015, 2:10 PM

## 2015-03-21 ENCOUNTER — Inpatient Hospital Stay (HOSPITAL_COMMUNITY)
Admission: EM | Admit: 2015-03-21 | Discharge: 2015-03-25 | DRG: 639 | Disposition: A | Discharge status: Home / Self Care | Payer: BLUE CROSS/BLUE SHIELD | Attending: Internal Medicine | Admitting: Internal Medicine

## 2015-03-21 ENCOUNTER — Encounter (HOSPITAL_COMMUNITY): Payer: Self-pay | Admitting: Emergency Medicine

## 2015-03-21 DIAGNOSIS — D72829 Elevated white blood cell count, unspecified: Secondary | ICD-10-CM | POA: Diagnosis present

## 2015-03-21 DIAGNOSIS — E1110 Type 2 diabetes mellitus with ketoacidosis without coma: Secondary | ICD-10-CM | POA: Diagnosis present

## 2015-03-21 DIAGNOSIS — E876 Hypokalemia: Secondary | ICD-10-CM | POA: Diagnosis present

## 2015-03-21 DIAGNOSIS — J45909 Unspecified asthma, uncomplicated: Secondary | ICD-10-CM | POA: Diagnosis present

## 2015-03-21 DIAGNOSIS — Z9049 Acquired absence of other specified parts of digestive tract: Secondary | ICD-10-CM

## 2015-03-21 DIAGNOSIS — R112 Nausea with vomiting, unspecified: Secondary | ICD-10-CM | POA: Diagnosis not present

## 2015-03-21 DIAGNOSIS — E1043 Type 1 diabetes mellitus with diabetic autonomic (poly)neuropathy: Secondary | ICD-10-CM | POA: Diagnosis present

## 2015-03-21 DIAGNOSIS — Z794 Long term (current) use of insulin: Secondary | ICD-10-CM

## 2015-03-21 DIAGNOSIS — E1010 Type 1 diabetes mellitus with ketoacidosis without coma: Secondary | ICD-10-CM | POA: Diagnosis not present

## 2015-03-21 DIAGNOSIS — K3184 Gastroparesis: Secondary | ICD-10-CM | POA: Diagnosis present

## 2015-03-21 DIAGNOSIS — R000 Tachycardia, unspecified: Secondary | ICD-10-CM | POA: Diagnosis present

## 2015-03-21 DIAGNOSIS — R197 Diarrhea, unspecified: Secondary | ICD-10-CM | POA: Diagnosis present

## 2015-03-21 LAB — COMPREHENSIVE METABOLIC PANEL
ALK PHOS: 96 U/L (ref 38–126)
ALT: 16 U/L (ref 14–54)
AST: 26 U/L (ref 15–41)
Albumin: 4.6 g/dL (ref 3.5–5.0)
Anion gap: 13 (ref 5–15)
BILIRUBIN TOTAL: 0.6 mg/dL (ref 0.3–1.2)
BUN: 19 mg/dL (ref 6–20)
CO2: 21 mmol/L — ABNORMAL LOW (ref 22–32)
CREATININE: 0.68 mg/dL (ref 0.44–1.00)
Calcium: 9.4 mg/dL (ref 8.9–10.3)
Chloride: 104 mmol/L (ref 101–111)
GFR calc Af Amer: >60 mL/min (ref >60)
Glucose, Bld: 134 mg/dL — ABNORMAL HIGH (ref 65–99)
Potassium: 3.8 mmol/L (ref 3.5–5.1)
Sodium: 138 mmol/L (ref 135–145)
TOTAL PROTEIN: 8.2 g/dL — AB (ref 6.5–8.1)

## 2015-03-21 LAB — URINALYSIS, ROUTINE W REFLEX MICROSCOPIC
Hgb urine dipstick: NEGATIVE
LEUKOCYTES UA: NEGATIVE
NITRITE: NEGATIVE
PROTEIN: NEGATIVE mg/dL
Specific Gravity, Urine: 1.027 (ref 1.005–1.030)
pH: 5.5 (ref 5.0–8.0)

## 2015-03-21 LAB — URINE MICROSCOPIC-ADD ON: RBC / HPF: NONE SEEN RBC/hpf (ref 0–5)

## 2015-03-21 LAB — CBC
HCT: 41.9 % (ref 36.0–46.0)
Hemoglobin: 14 g/dL (ref 12.0–15.0)
MCH: 31.1 pg (ref 26.0–34.0)
MCHC: 33.4 g/dL (ref 30.0–36.0)
MCV: 93.1 fL (ref 78.0–100.0)
PLATELETS: 424 10*3/uL — AB (ref 150–400)
RBC: 4.5 MIL/uL (ref 3.87–5.11)
RDW: 13.8 % (ref 11.5–15.5)
WBC: 8.2 10*3/uL (ref 4.0–10.5)

## 2015-03-21 LAB — CBG MONITORING, ED
GLUCOSE-CAPILLARY: 398 mg/dL — AB (ref 65–99)
GLUCOSE-CAPILLARY: 99 mg/dL (ref 65–99)
Glucose-Capillary: 84 mg/dL (ref 65–99)

## 2015-03-21 LAB — POC URINE PREG, ED: PREG TEST UR: NEGATIVE

## 2015-03-21 LAB — LIPASE, BLOOD: Lipase: 19 U/L (ref 11–51)

## 2015-03-21 MED ORDER — METOCLOPRAMIDE HCL 5 MG/ML IJ SOLN
10.0000 mg | Freq: Once | INTRAMUSCULAR | Status: AC
  Administered 2015-03-21: 10 mg via INTRAVENOUS
  Filled 2015-03-21: qty 2

## 2015-03-21 MED ORDER — ONDANSETRON HCL 4 MG/2ML IJ SOLN
4.0000 mg | Freq: Once | INTRAMUSCULAR | Status: AC
  Administered 2015-03-21: 4 mg via INTRAVENOUS
  Filled 2015-03-21: qty 2

## 2015-03-21 MED ORDER — SODIUM CHLORIDE 0.9 % IV BOLUS (SEPSIS)
1000.0000 mL | Freq: Once | INTRAVENOUS | Status: AC
  Administered 2015-03-21: 1000 mL via INTRAVENOUS

## 2015-03-21 MED ORDER — ACETAMINOPHEN 325 MG PO TABS
650.0000 mg | ORAL_TABLET | Freq: Once | ORAL | Status: AC
  Administered 2015-03-21: 650 mg via ORAL
  Filled 2015-03-21: qty 2

## 2015-03-21 MED ORDER — SODIUM CHLORIDE 0.9 % IV BOLUS (SEPSIS)
2000.0000 mL | Freq: Once | INTRAVENOUS | Status: AC
  Administered 2015-03-21: 2000 mL via INTRAVENOUS

## 2015-03-21 NOTE — ED Notes (Signed)
Pt has tolerated the ginger ale but still reports feeling badly and desires admission.  EDP rounded on her

## 2015-03-21 NOTE — ED Notes (Signed)
Pt was getting her blood drawn and reported to the phlebotomist that she was feeling lightheaded. Pt moved to a room with a triage stretcher for the time being.

## 2015-03-21 NOTE — ED Notes (Signed)
Pt has been eating some ice and has tolerated this.  Gave her some gingerale.  EDP notified

## 2015-03-21 NOTE — ED Notes (Signed)
Pt with Hx of DM c/o emesis, diarrhea x 2 days, went to PCP who sent her to ED for ketoacidosis.

## 2015-03-21 NOTE — ED Notes (Signed)
Pt is nauseated, edp notified as I come back to room pt is vomiting

## 2015-03-21 NOTE — ED Provider Notes (Addendum)
CSN: [id number]     Arrival date & time 03/21/15  1455 History   First MD Initiated Contact with Patient 03/21/15 1631     Chief Complaint  Patient presents with  . Ketones in Urine   . Emesis     (Consider location/radiation/quality/duration/timing/severity/associated sxs/prior Treatment) HPI Patient complains of vomiting and diarrhea onset 3 days ago. She denies pain anywhere. She vomited approximately 10 times today. She does not feel nauseated at present. Her last episode of diarrhea was yesterday. Had 5 episodes of diarrhea yesterday. She was seen by her primary care physician today though to have ketones in her urine sent here for further evaluation. She denies fever denies abdominal pain denies headache denies chest pain only complaint presently is generalized weakness. No other associated symptoms. Last normal menstrual period 03/16/2015 nothing makes symptoms better or worse. No other associated symptoms. No recent travel. No recent antibiotic Past Medical History  Diagnosis Date  . Diabetes mellitus   . Asthma   . Pancreatitis, acute 11/26/2014  . Hepatic steatosis 11/26/2014  . Liver mass 11/26/2014  . Hypertension     NOT CURRENTLY ON ANY BP MED  . Heart murmur   . Arthritis   . Gallstones   . Anxiety    Past Surgical History  Procedure Laterality Date  . Wisdom tooth extraction    . Esophagogastroduodenoscopy (egd) with propofol Left 09/20/2014    Procedure: ESOPHAGOGASTRODUODENOSCOPY (EGD) WITH PROPOFOL;  Surgeon: Arta Silence, MD;  Location: [facility] ENDOSCOPY;  Service: Endoscopy;  Laterality: Left;  . Cholecystectomy N/A 02/11/2015    Procedure: LAPAROSCOPIC CHOLECYSTECTOMY WITH INTRAOPERATIVE CHOLANGIOGRAM;  Surgeon: Greer Pickerel, MD;  Location: [facility] ORS;  Service: General;  Laterality: N/A;   Family History  Problem Relation Age of Onset  . Diabetes Mother   . Hyperlipidemia Mother   . Hypertension Father   . Heart disease Father   . Heart disease Maternal Grandmother    . Heart disease Maternal Grandfather   . Hypertension Paternal Grandmother   . Cancer Paternal Grandfather    Social History  Substance Use Topics  . Smoking status: Never Smoker   . Smokeless tobacco: Never Used  . Alcohol Use: No   OB History    Gravida Para Term Preterm AB TAB SAB Ectopic Multiple Living   2 1 0 1 1 1 0 0 0 1      Review of Systems  HENT: Negative.   Respiratory: Negative.   Cardiovascular: Negative.   Gastrointestinal: Positive for vomiting and diarrhea.  Musculoskeletal: Negative.   Skin: Negative.   Allergic/Immunologic: Positive for immunocompromised state.       Diabetic  Neurological: Positive for weakness.  Psychiatric/Behavioral: Negative.   All other systems reviewed and are negative.     Allergies  Peanut-containing drug products and Strawberry extract  Home Medications   Prior to Admission medications   Medication Sig Start Date End Date Taking? Authorizing Provider  albuterol (PROVENTIL HFA;VENTOLIN HFA) 108 (90 BASE) MCG/ACT inhaler Inhale 2 puffs into the lungs every 6 (six) hours as needed for shortness of breath.   Yes Historical Provider, MD  albuterol (PROVENTIL) (2.5 MG/3ML) 0.083% nebulizer solution Take 2.5 mg by nebulization every 6 (six) hours as needed for shortness of breath.   Yes Historical Provider, MD  budesonide-formoterol (SYMBICORT) 160-4.5 MCG/ACT inhaler Inhale 2 puffs into the lungs 2 (two) times daily as needed (SOB, wheezing).    Yes Historical Provider, MD  EPINEPHrine (EPIPEN) 0.3 mg/0.3 mL SOAJ injection Inject 0.3 mg into  the muscle once.   Yes Historical Provider, MD  insulin aspart (NOVOLOG FLEXPEN) 100 UNIT/ML FlexPen INJECT 0-20 UNITS INTO THE SKIN AS DIRECTED. SSI - CARB SCALE, 0-20 UNITS Patient taking differently: Inject into the skin 4 (four) times daily as needed for high blood sugar. Sliding scale 10/19/13  Yes Elayne Snare, MD  Insulin Glargine (LANTUS SOLOSTAR) 100 UNIT/ML Solostar Pen Inject 36 Units  into the skin daily at 10 pm. Patient taking differently: Inject 24 Units into the skin daily at 10 pm.  10/19/13  Yes Elayne Snare, MD  montelukast (SINGULAIR) 10 MG tablet Take 10 mg by mouth daily.   Yes Historical Provider, MD  glucagon 1 MG injection Inject 1 mg into the muscle as needed. FOR LOW BLOOD SUGAR 01/31/14 02/06/15  Historical Provider, MD  oxyCODONE (OXY IR/ROXICODONE) 5 MG immediate release tablet Take 1-2 tablets (5-10 mg total) by mouth every 4 (four) hours as needed for moderate pain, severe pain or breakthrough pain. Patient not taking: Reported on 03/21/2015 02/11/15   Greer Pickerel, MD   BP 122/98 mmHg  Pulse 118  Temp(Src) 98.3 F (36.8 C) (Oral)  Resp 18  SpO2 100% Physical Exam  Constitutional: She appears well-developed and well-nourished. No distress.  HENT:  Head: Normocephalic and atraumatic.  Mucous membranes dry  Eyes: Conjunctivae are normal. Pupils are equal, round, and reactive to light.  Neck: Neck supple. No tracheal deviation present. No thyromegaly present.  Cardiovascular: Regular rhythm.   No murmur heard. Mildly tachycardic  Pulmonary/Chest: Effort normal and breath sounds normal.  Abdominal: Soft. Bowel sounds are normal. She exhibits no distension. There is no tenderness.  Musculoskeletal: Normal range of motion. She exhibits no edema or tenderness.  Neurological: She is alert. Coordination normal.  Skin: Skin is warm and dry. No rash noted.  Psychiatric: She has a normal mood and affect.  Nursing note and vitals reviewed.   ED Course  Procedures (including critical care time) Labs Review Labs Reviewed  CBC - Abnormal; Notable for the following:    Platelets 424 (*)    All other components within normal limits  LIPASE, BLOOD  COMPREHENSIVE METABOLIC PANEL  URINALYSIS, ROUTINE W REFLEX MICROSCOPIC (NOT AT [facility])  CBG MONITORING, ED  CBG MONITORING, ED  POC URINE PREG, ED    Imaging Review No results found. I have personally reviewed  and evaluated these images and lab results as part of my medical decision-making.   EKG Interpretation None     2 2:10 PM after being treated with 3 L of normal saline intravenously and 3 doses of intravenous Zofran she is still nauseated and still vomiting. Complains of diffuse body aches. Intravenous Reglan ordered. 12 midnight patient continues to complain of nausea. She vomited again at 12:10 AM CBG at 20 3:55 PM is 398.  Iv morphine ordered at 1214 am for difuse body aches Results for orders placed or performed during the hospital encounter of 03/21/15  Lipase, blood  Result Value Ref Range   Lipase 19 11 - 51 U/L  Comprehensive metabolic panel  Result Value Ref Range   Sodium 138 135 - 145 mmol/L   Potassium 3.8 3.5 - 5.1 mmol/L   Chloride 104 101 - 111 mmol/L   CO2 21 (L) 22 - 32 mmol/L   Glucose, Bld 134 (H) 65 - 99 mg/dL   BUN 19 6 - 20 mg/dL   Creatinine, Ser 0.68 0.44 - 1.00 mg/dL   Calcium 9.4 8.9 - 10.3 mg/dL   Total  Protein 8.2 (H) 6.5 - 8.1 g/dL   Albumin 4.6 3.5 - 5.0 g/dL   AST 26 15 - 41 U/L   ALT 16 14 - 54 U/L   Alkaline Phosphatase 96 38 - 126 U/L   Total Bilirubin 0.6 0.3 - 1.2 mg/dL   GFR calc non Af Amer >60 >60 mL/min   GFR calc Af Amer >60 >60 mL/min   Anion gap 13 5 - 15  CBC  Result Value Ref Range   WBC 8.2 4.0 - 10.5 K/uL   RBC 4.50 3.87 - 5.11 MIL/uL   Hemoglobin 14.0 12.0 - 15.0 g/dL   HCT 41.9 36.0 - 46.0 %   MCV 93.1 78.0 - 100.0 fL   MCH 31.1 26.0 - 34.0 pg   MCHC 33.4 30.0 - 36.0 g/dL   RDW 13.8 11.5 - 15.5 %   Platelets 424 (H) 150 - 400 K/uL  Urinalysis, Routine w reflex microscopic (not at [facility])  Result Value Ref Range   Color, Urine YELLOW YELLOW   APPearance CLEAR CLEAR   Specific Gravity, Urine 1.027 1.005 - 1.030   pH 5.5 5.0 - 8.0   Glucose, UA >1000 (A) NEGATIVE mg/dL   Hgb urine dipstick NEGATIVE NEGATIVE   Bilirubin Urine MODERATE (A) NEGATIVE   Ketones, ur >80 (A) NEGATIVE mg/dL   Protein, ur NEGATIVE NEGATIVE mg/dL    Nitrite NEGATIVE NEGATIVE   Leukocytes, UA NEGATIVE NEGATIVE  Urine microscopic-add on  Result Value Ref Range   Squamous Epithelial / LPF 0-5 (A) NONE SEEN   WBC, UA 0-5 0 - 5 WBC/hpf   RBC / HPF NONE SEEN 0 - 5 RBC/hpf   Bacteria, UA RARE (A) NONE SEEN   Urine-Other MUCOUS PRESENT   Blood gas, venous  Result Value Ref Range   FIO2 0.21    Delivery systems ROOM AIR    pH, Ven 7.170 (LL) 7.250 - 7.300   pCO2, Ven 29.2 (L) 45.0 - 50.0 mmHg   pO2, Ven  VALUE BELOW REPORTABLE RANGE. 30.0 - 45.0 mmHg   Bicarbonate 10.2 (L) 20.0 - 24.0 mEq/L   TCO2 9.7 0 - 100 mmol/L   Acid-base deficit 17.3 (H) 0.0 - 2.0 mmol/L   O2 Saturation 50.0 %   Patient temperature 98.6    Collection site VEIN    Drawn by COLLECTED BY NURSE    Sample type VENOUS   CBG monitoring, ED  Result Value Ref Range   Glucose-Capillary 84 65 - 99 mg/dL  CBG monitoring, ED  Result Value Ref Range   Glucose-Capillary 99 65 - 99 mg/dL  POC urine preg, ED (not at [facility])  Result Value Ref Range   Preg Test, Ur NEGATIVE NEGATIVE  CBG monitoring, ED  Result Value Ref Range   Glucose-Capillary 398 (H) 65 - 99 mg/dL   No results found.  MDM  Venous blood gas confirms diabetic ketoacidosis. Dr.Kakrakandy consulted. She will be admitted to stepdown unit. he will check repeat basic metabolic. We will continue to administer intravenous fluids pending basic metabolic results. Diagnoses #1 diabetic ketoacidosis #2 dehydration CRITICAL CARE Performed by: Orlie Dakin Total critical care time: 40 minutes Critical care time was exclusive of separately billable procedures and treating other patients. Critical care was necessary to treat or prevent imminent or life-threatening deterioration. Critical care was time spent personally by me on the following activities: development of treatment plan with patient and/or surrogate as well as nursing, discussions with consultants, evaluation of patient's response to treatment,  examination  of patient, obtaining history from patient or surrogate, ordering and performing treatments and interventions, ordering and review of laboratory studies, ordering and review of radiographic studies, pulse oximetry and re-evaluation of patient's condition. Final diagnoses:  None      Orlie Dakin, MD 03/22/15 [id number]  Orlie Dakin, MD 03/22/15 0110

## 2015-03-22 ENCOUNTER — Encounter (HOSPITAL_COMMUNITY): Payer: Self-pay | Admitting: Internal Medicine

## 2015-03-22 ENCOUNTER — Inpatient Hospital Stay (HOSPITAL_COMMUNITY): Payer: BLUE CROSS/BLUE SHIELD

## 2015-03-22 DIAGNOSIS — E876 Hypokalemia: Secondary | ICD-10-CM | POA: Diagnosis present

## 2015-03-22 DIAGNOSIS — K3184 Gastroparesis: Secondary | ICD-10-CM | POA: Diagnosis present

## 2015-03-22 DIAGNOSIS — R197 Diarrhea, unspecified: Secondary | ICD-10-CM | POA: Diagnosis not present

## 2015-03-22 DIAGNOSIS — E1043 Type 1 diabetes mellitus with diabetic autonomic (poly)neuropathy: Secondary | ICD-10-CM | POA: Diagnosis present

## 2015-03-22 DIAGNOSIS — D72829 Elevated white blood cell count, unspecified: Secondary | ICD-10-CM | POA: Diagnosis present

## 2015-03-22 DIAGNOSIS — G43A0 Cyclical vomiting, not intractable: Secondary | ICD-10-CM

## 2015-03-22 DIAGNOSIS — E1010 Type 1 diabetes mellitus with ketoacidosis without coma: Principal | ICD-10-CM

## 2015-03-22 DIAGNOSIS — R112 Nausea with vomiting, unspecified: Secondary | ICD-10-CM | POA: Diagnosis not present

## 2015-03-22 DIAGNOSIS — J45909 Unspecified asthma, uncomplicated: Secondary | ICD-10-CM | POA: Diagnosis present

## 2015-03-22 DIAGNOSIS — E0810 Diabetes mellitus due to underlying condition with ketoacidosis without coma: Secondary | ICD-10-CM | POA: Diagnosis not present

## 2015-03-22 DIAGNOSIS — E1110 Type 2 diabetes mellitus with ketoacidosis without coma: Secondary | ICD-10-CM | POA: Diagnosis present

## 2015-03-22 DIAGNOSIS — R000 Tachycardia, unspecified: Secondary | ICD-10-CM | POA: Diagnosis present

## 2015-03-22 DIAGNOSIS — Z794 Long term (current) use of insulin: Secondary | ICD-10-CM | POA: Diagnosis not present

## 2015-03-22 DIAGNOSIS — Z9049 Acquired absence of other specified parts of digestive tract: Secondary | ICD-10-CM | POA: Diagnosis not present

## 2015-03-22 LAB — RAPID URINE DRUG SCREEN, HOSP PERFORMED
AMPHETAMINES: NOT DETECTED
BENZODIAZEPINES: NOT DETECTED
Barbiturates: NOT DETECTED
Cocaine: NOT DETECTED
Opiates: POSITIVE — AB
Tetrahydrocannabinol: NOT DETECTED

## 2015-03-22 LAB — BLOOD GAS, VENOUS
Acid-base deficit: 17.3 mmol/L — ABNORMAL HIGH (ref 0.0–2.0)
Bicarbonate: 10.2 mEq/L — ABNORMAL LOW (ref 20.0–24.0)
FIO2: 0.21
O2 SAT: 50 %
PATIENT TEMPERATURE: 98.6
PCO2 VEN: 29.2 mmHg — AB (ref 45.0–50.0)
TCO2: 9.7 mmol/L (ref 0–100)
pH, Ven: 7.17 — CL (ref 7.250–7.300)

## 2015-03-22 LAB — BLOOD GAS, ARTERIAL
Acid-base deficit: 20.1 mmol/L — ABNORMAL HIGH (ref 0.0–2.0)
BICARBONATE: 6.7 meq/L — AB (ref 20.0–24.0)
FIO2: 0.21
O2 Saturation: 98.1 %
PATIENT TEMPERATURE: 98.6
PCO2 ART: 17.7 mmHg — AB (ref 35.0–45.0)
PH ART: 7.202 — AB (ref 7.350–7.450)
PO2 ART: 119 mmHg — AB (ref 80.0–100.0)
TCO2: 6.3 mmol/L (ref 0–100)

## 2015-03-22 LAB — CK: CK TOTAL: 66 U/L (ref 38–234)

## 2015-03-22 LAB — CBG MONITORING, ED
GLUCOSE-CAPILLARY: 475 mg/dL — AB (ref 65–99)
GLUCOSE-CAPILLARY: 387 mg/dL — AB (ref 65–99)
GLUCOSE-CAPILLARY: 152 mg/dL — AB (ref 65–99)
GLUCOSE-CAPILLARY: 185 mg/dL — AB (ref 65–99)
Glucose-Capillary: 496 mg/dL — ABNORMAL HIGH (ref 65–99)
Glucose-Capillary: 264 mg/dL — ABNORMAL HIGH (ref 65–99)
Glucose-Capillary: 125 mg/dL — ABNORMAL HIGH (ref 65–99)
Glucose-Capillary: 178 mg/dL — ABNORMAL HIGH (ref 65–99)
Glucose-Capillary: 194 mg/dL — ABNORMAL HIGH (ref 65–99)
Glucose-Capillary: 160 mg/dL — ABNORMAL HIGH (ref 65–99)
Glucose-Capillary: 127 mg/dL — ABNORMAL HIGH (ref 65–99)
Glucose-Capillary: 158 mg/dL — ABNORMAL HIGH (ref 65–99)
Glucose-Capillary: 133 mg/dL — ABNORMAL HIGH (ref 65–99)

## 2015-03-22 LAB — BASIC METABOLIC PANEL
ANION GAP: 11 (ref 5–15)
ANION GAP: 12 (ref 5–15)
ANION GAP: 15 (ref 5–15)
ANION GAP: 20 — AB (ref 5–15)
BUN: 14 mg/dL (ref 6–20)
BUN: 15 mg/dL (ref 6–20)
BUN: 10 mg/dL (ref 6–20)
BUN: 9 mg/dL (ref 6–20)
BUN: 11 mg/dL (ref 6–20)
CALCIUM: 8.3 mg/dL — AB (ref 8.9–10.3)
CALCIUM: 8.3 mg/dL — AB (ref 8.9–10.3)
CALCIUM: 8.5 mg/dL — AB (ref 8.9–10.3)
CO2: 7 mmol/L — ABNORMAL LOW (ref 22–32)
CO2: <7 mmol/L — ABNORMAL LOW (ref 22–32)
CO2: 9 mmol/L — ABNORMAL LOW (ref 22–32)
CO2: 10 mmol/L — ABNORMAL LOW (ref 22–32)
CO2: 13 mmol/L — ABNORMAL LOW (ref 22–32)
CREATININE: 0.99 mg/dL (ref 0.44–1.00)
Calcium: 8.1 mg/dL — ABNORMAL LOW (ref 8.9–10.3)
Calcium: 8.6 mg/dL — ABNORMAL LOW (ref 8.9–10.3)
Chloride: 109 mmol/L (ref 101–111)
Chloride: 111 mmol/L (ref 101–111)
Chloride: 112 mmol/L — ABNORMAL HIGH (ref 101–111)
Chloride: 112 mmol/L — ABNORMAL HIGH (ref 101–111)
Chloride: 112 mmol/L — ABNORMAL HIGH (ref 101–111)
Creatinine, Ser: 0.75 mg/dL (ref 0.44–1.00)
Creatinine, Ser: 0.76 mg/dL (ref 0.44–1.00)
Creatinine, Ser: 0.6 mg/dL (ref 0.44–1.00)
Creatinine, Ser: 0.56 mg/dL (ref 0.44–1.00)
GFR calc Af Amer: >60 mL/min (ref >60)
GFR calc Af Amer: >60 mL/min (ref >60)
GFR calc Af Amer: >60 mL/min (ref >60)
GFR calc Af Amer: >60 mL/min (ref >60)
GFR calc non Af Amer: >60 mL/min (ref >60)
GLUCOSE: 198 mg/dL — AB (ref 65–99)
GLUCOSE: 212 mg/dL — AB (ref 65–99)
GLUCOSE: 233 mg/dL — AB (ref 65–99)
GLUCOSE: 520 mg/dL — AB (ref 65–99)
Glucose, Bld: 475 mg/dL — ABNORMAL HIGH (ref 65–99)
POTASSIUM: 4.3 mmol/L (ref 3.5–5.1)
Potassium: 5.1 mmol/L (ref 3.5–5.1)
Potassium: 5 mmol/L (ref 3.5–5.1)
Potassium: 4.5 mmol/L (ref 3.5–5.1)
Potassium: 3.7 mmol/L (ref 3.5–5.1)
SODIUM: 139 mmol/L (ref 135–145)
SODIUM: 136 mmol/L (ref 135–145)
SODIUM: 134 mmol/L — AB (ref 135–145)
SODIUM: 136 mmol/L (ref 135–145)
Sodium: 136 mmol/L (ref 135–145)

## 2015-03-22 LAB — GLUCOSE, CAPILLARY
GLUCOSE-CAPILLARY: 197 mg/dL — AB (ref 65–99)
GLUCOSE-CAPILLARY: 124 mg/dL — AB (ref 65–99)
Glucose-Capillary: 158 mg/dL — ABNORMAL HIGH (ref 65–99)
Glucose-Capillary: 155 mg/dL — ABNORMAL HIGH (ref 65–99)

## 2015-03-22 LAB — INFLUENZA PANEL BY PCR (TYPE A & B)
H1N1FLUPCR: NOT DETECTED
Influenza A By PCR: NEGATIVE
Influenza B By PCR: NEGATIVE

## 2015-03-22 LAB — CBC
HCT: 46.3 % — ABNORMAL HIGH (ref 36.0–46.0)
Hemoglobin: 14.5 g/dL (ref 12.0–15.0)
MCH: 31.2 pg (ref 26.0–34.0)
MCHC: 31.3 g/dL (ref 30.0–36.0)
MCV: 99.6 fL (ref 78.0–100.0)
PLATELETS: 467 10*3/uL — AB (ref 150–400)
RBC: 4.65 MIL/uL (ref 3.87–5.11)
RDW: 14.1 % (ref 11.5–15.5)
WBC: 21.3 10*3/uL — AB (ref 4.0–10.5)

## 2015-03-22 LAB — TROPONIN I: Troponin I: <0.03 ng/mL (ref <0.031)

## 2015-03-22 LAB — MRSA PCR SCREENING: MRSA by PCR: NEGATIVE

## 2015-03-22 MED ORDER — CETYLPYRIDINIUM CHLORIDE 0.05 % MT LIQD
7.0000 mL | Freq: Two times a day (BID) | OROMUCOSAL | Status: DC
  Administered 2015-03-23 – 2015-03-25 (×5): 7 mL via OROMUCOSAL

## 2015-03-22 MED ORDER — POTASSIUM CHLORIDE 10 MEQ/100ML IV SOLN
10.0000 meq | INTRAVENOUS | Status: DC
  Administered 2015-03-22: 10 meq via INTRAVENOUS
  Filled 2015-03-22 (×2): qty 100

## 2015-03-22 MED ORDER — CLOTRIMAZOLE 1 % VA CREA
1.0000 | TOPICAL_CREAM | Freq: Every day | VAGINAL | Status: DC
  Administered 2015-03-22 – 2015-03-24 (×3): 1 via VAGINAL
  Filled 2015-03-22: qty 45

## 2015-03-22 MED ORDER — HYDROCODONE-ACETAMINOPHEN 5-325 MG PO TABS
1.0000 | ORAL_TABLET | ORAL | Status: DC | PRN
  Administered 2015-03-22 – 2015-03-24 (×4): 1 via ORAL
  Filled 2015-03-22 (×5): qty 1

## 2015-03-22 MED ORDER — SODIUM CHLORIDE 0.9 % IV SOLN
INTRAVENOUS | Status: DC
  Administered 2015-03-22: 1000 mL via INTRAVENOUS

## 2015-03-22 MED ORDER — MORPHINE SULFATE (PF) 2 MG/ML IV SOLN
1.0000 mg | Freq: Once | INTRAVENOUS | Status: AC
  Administered 2015-03-22: 1 mg via INTRAVENOUS
  Filled 2015-03-22: qty 1

## 2015-03-22 MED ORDER — CHLORHEXIDINE GLUCONATE 0.12 % MT SOLN
15.0000 mL | Freq: Two times a day (BID) | OROMUCOSAL | Status: DC
  Administered 2015-03-22 – 2015-03-25 (×6): 15 mL via OROMUCOSAL
  Filled 2015-03-22 (×5): qty 15

## 2015-03-22 MED ORDER — DEXTROSE-NACL 5-0.45 % IV SOLN
INTRAVENOUS | Status: DC
  Administered 2015-03-22 – 2015-03-23 (×3): via INTRAVENOUS

## 2015-03-22 MED ORDER — ONDANSETRON HCL 4 MG/2ML IJ SOLN
4.0000 mg | Freq: Four times a day (QID) | INTRAMUSCULAR | Status: DC | PRN
  Administered 2015-03-22 – 2015-03-24 (×3): 4 mg via INTRAVENOUS
  Filled 2015-03-22 (×3): qty 2

## 2015-03-22 MED ORDER — MORPHINE SULFATE (PF) 4 MG/ML IV SOLN
4.0000 mg | Freq: Once | INTRAVENOUS | Status: AC
  Administered 2015-03-22: 4 mg via INTRAVENOUS
  Filled 2015-03-22: qty 1

## 2015-03-22 MED ORDER — ENOXAPARIN SODIUM 40 MG/0.4ML ~~LOC~~ SOLN
40.0000 mg | SUBCUTANEOUS | Status: DC
  Administered 2015-03-22 – 2015-03-25 (×4): 40 mg via SUBCUTANEOUS
  Filled 2015-03-22 (×4): qty 0.4

## 2015-03-22 MED ORDER — CLOTRIMAZOLE 2 % VA CREA
1.0000 | TOPICAL_CREAM | Freq: Every day | VAGINAL | Status: DC
  Filled 2015-03-22: qty 21

## 2015-03-22 MED ORDER — HYDROMORPHONE HCL 1 MG/ML IJ SOLN
0.5000 mg | INTRAMUSCULAR | Status: DC | PRN
  Administered 2015-03-22 – 2015-03-25 (×13): 0.5 mg via INTRAVENOUS
  Filled 2015-03-22 (×13): qty 1

## 2015-03-22 MED ORDER — ALBUTEROL SULFATE (2.5 MG/3ML) 0.083% IN NEBU: 2.5000 mg | INHALATION_SOLUTION | Freq: Four times a day (QID) | RESPIRATORY_TRACT | Status: DC | PRN

## 2015-03-22 MED ORDER — SODIUM CHLORIDE 0.9 % IV SOLN
INTRAVENOUS | Status: DC
  Administered 2015-03-22: 4.4 [IU]/h via INTRAVENOUS
  Filled 2015-03-22: qty 2.5

## 2015-03-22 MED ORDER — MOMETASONE FURO-FORMOTEROL FUM 200-5 MCG/ACT IN AERO
2.0000 | INHALATION_SPRAY | Freq: Two times a day (BID) | RESPIRATORY_TRACT | Status: DC
  Filled 2015-03-22: qty 8.8

## 2015-03-22 NOTE — Progress Notes (Signed)
Utilization Review completed.  Miyoko Hashimi RN CM  

## 2015-03-22 NOTE — Progress Notes (Signed)
Inpatient Diabetes Program Recommendations  AACE/ADA: New Consensus Statement on Inpatient Glycemic Control (2015)  Target Ranges:  Prepandial:   less than 140 mg/dL      Peak postprandial:   less than 180 mg/dL (1-2 hours)      Critically ill patients:  140 - 180 mg/dL   Results for [name], [name] (MRN [id number]) as of 03/22/2015 10:03  Ref. Range 03/22/2015 00:37  Sodium Latest Ref Range: 135-145 mmol/L 136  Potassium Latest Ref Range: 3.5-5.1 mmol/L 4.3  Chloride Latest Ref Range: 101-111 mmol/L 109  CO2 Latest Ref Range: 22-32 mmol/L 7 (L)  BUN Latest Ref Range: 6-20 mg/dL 14  Creatinine Latest Ref Range: 0.44-1.00 mg/dL 0.75  Calcium Latest Ref Range: 8.9-10.3 mg/dL 8.1 (L)  EGFR (Non-African Amer.) Latest Ref Range: >60 mL/min >60  EGFR (African American) Latest Ref Range: >60 mL/min >60  Glucose Latest Ref Range: 65-99 mg/dL 520 (H)  Anion gap Latest Ref Range: 5-15  20 (H)    Admit with: DKA  History: Type 1 DM  Home DM Meds: Basaglar (Insulin glargine) 24 units QHS        Novolog 1 unit for every 10 grams of carbohydrates consumed        Novolog 1 unit for every 50 mg/dl above target CBG of 150 mg/dl  Current Insulin Orders: IV Insulin drip started at 3am      -Note through review of Care Everywhere tab, patient last saw her Endocrinologist (Dr. Hartford Poli with [facility]) on 01/31/15.  Insulins were adjusted to the above listed regimen.  -Results from 5am BMET show patient still acidotic and not ready to transition off IV insulin drip.  -Note Dextrose added to IVF at 9am today per DKA protocol orders.    MD- When patient's CO2 is 20 or greater and Anion Gap has closed, please consider the following transition to SQ insulin recommendations:  1. Start home dose of basal insulin- Lantus 24 units daily (make sure to give 1st dose at least one hour before IV insulin drip stopped)  2. Start Novolog Sensitive Correction Scale/ SSI (0-9 units) TID AC + HS  3. Patient  will also need Novolog Meal Coverage once she is eating- Can do a custom scale for her like she does at home.  1 unit Novolog for every 10 grams of Carbohydrates.       --Will follow patient during hospitalization--  Wyn Quaker RN, MSN, CDE Diabetes Coordinator Inpatient Glycemic Control Team Team Pager: [phone] (8a-5p)

## 2015-03-22 NOTE — ED Notes (Signed)
Continue with insulin drip per dr tat

## 2015-03-22 NOTE — H&P (Signed)
[facility] History and Physical  [name] [id number] DOB: 1990/08/02 DOA: 03/21/2015  Referring physician: Dr. Cathleen Fears. PCP: Vicenta Aly, FNP  Specialists: None.  Chief Complaint: Nausea vomiting and diarrhea.  HPI: [name] is a 25 y.o. female with history of diabetes mellitus type 1, asthma, hypertension who was recently admitted for lap cholecystectomy in January 2017 process to the ER because of persistent nausea vomiting and diarrhea over last 2 days. Patient denies any abdominal pain. Has been having generalized body aches. Denies any chest pain shortness of breath or productive cough. Patient had gone to her PCP yesterday and found to have low blood sugar in the 60s. Patient was referred to the ER. In the ER patient blood sugar started to increase slowly and repeat blood work shows patient is in DKA. Patient states she has not missed her long-acting medications. Patient is admitted for diabetic ketoacidosis. Patient states she has been having clear vomiting and diarrhea with no blood in it.   Review of Systems: As presented in the history of presenting illness, rest negative.  Past Medical History  Diagnosis Date  . Diabetes mellitus   . Asthma   . Pancreatitis, acute 11/26/2014  . Hepatic steatosis 11/26/2014  . Liver mass 11/26/2014  . Hypertension     NOT CURRENTLY ON ANY BP MED  . Heart murmur   . Arthritis   . Gallstones   . Anxiety    Past Surgical History  Procedure Laterality Date  . Wisdom tooth extraction    . Esophagogastroduodenoscopy (egd) with propofol Left 09/20/2014    Procedure: ESOPHAGOGASTRODUODENOSCOPY (EGD) WITH PROPOFOL;  Surgeon: Arta Silence, MD;  Location: [facility] ENDOSCOPY;  Service: Endoscopy;  Laterality: Left;  . Cholecystectomy N/A 02/11/2015    Procedure: LAPAROSCOPIC CHOLECYSTECTOMY WITH INTRAOPERATIVE CHOLANGIOGRAM;  Surgeon: Greer Pickerel, MD;  Location: [facility] ORS;  Service: General;  Laterality: N/A;   Social History:   reports that she has never smoked. She has never used smokeless tobacco. She reports that she uses illicit drugs (Marijuana). She reports that she does not drink alcohol. Where does patient live home. Can patient participate in ADLs? Yes.  Allergies  Allergen Reactions  . Peanut-Containing Drug Products Swelling    Swelling of mouth, lips  . Strawberry Extract Swelling    Swelling of mouth, lips    Family History:  Family History  Problem Relation Age of Onset  . Diabetes Mother   . Hyperlipidemia Mother   . Hypertension Father   . Heart disease Father   . Heart disease Maternal Grandmother   . Heart disease Maternal Grandfather   . Hypertension Paternal Grandmother   . Cancer Paternal Grandfather       Prior to Admission medications   Medication Sig Start Date End Date Taking? Authorizing Provider  albuterol (PROVENTIL HFA;VENTOLIN HFA) 108 (90 BASE) MCG/ACT inhaler Inhale 2 puffs into the lungs every 6 (six) hours as needed for shortness of breath.   Yes Historical Provider, MD  albuterol (PROVENTIL) (2.5 MG/3ML) 0.083% nebulizer solution Take 2.5 mg by nebulization every 6 (six) hours as needed for shortness of breath.   Yes Historical Provider, MD  budesonide-formoterol (SYMBICORT) 160-4.5 MCG/ACT inhaler Inhale 2 puffs into the lungs 2 (two) times daily as needed (SOB, wheezing).    Yes Historical Provider, MD  EPINEPHrine (EPIPEN) 0.3 mg/0.3 mL SOAJ injection Inject 0.3 mg into the muscle once.   Yes Historical Provider, MD  insulin aspart (NOVOLOG FLEXPEN) 100 UNIT/ML FlexPen INJECT 0-20 UNITS INTO THE SKIN AS DIRECTED.  SSI - CARB SCALE, 0-20 UNITS Patient taking differently: Inject into the skin 4 (four) times daily as needed for high blood sugar. Sliding scale 10/19/13  Yes Elayne Snare, MD  Insulin Glargine (LANTUS SOLOSTAR) 100 UNIT/ML Solostar Pen Inject 36 Units into the skin daily at 10 pm. Patient taking differently: Inject 24 Units into the skin daily at 10 pm.   10/19/13  Yes Elayne Snare, MD  montelukast (SINGULAIR) 10 MG tablet Take 10 mg by mouth daily.   Yes Historical Provider, MD  glucagon 1 MG injection Inject 1 mg into the muscle as needed. FOR LOW BLOOD SUGAR 01/31/14 02/06/15  Historical Provider, MD  oxyCODONE (OXY IR/ROXICODONE) 5 MG immediate release tablet Take 1-2 tablets (5-10 mg total) by mouth every 4 (four) hours as needed for moderate pain, severe pain or breakthrough pain. Patient not taking: Reported on 03/21/2015 02/11/15   Greer Pickerel, MD    Physical Exam: Filed Vitals:   03/21/15 2335 03/21/15 2352 03/22/15 0100 03/22/15 0130  BP:  137/78 106/50 114/63  Pulse: 118 125 125   Temp: 99.3 F (37.4 C)     TempSrc: Rectal     Resp:      SpO2: 100% 100% 97%      General:  Moderately built and nourished.  Eyes: Anicteric no pallor.  ENT: No discharge from the ears eyes nose and mouth.  Neck: No mass felt.  Cardiovascular: S1-S2 heard.  Respiratory: No rhonchi or crepitations.  Abdomen: Soft nontender bowel sounds present.  Skin: No rash.  Musculoskeletal: No edema.  Psychiatric: Appears normal.  Neurologic: Alert awake oriented to time place and person. Moves all his diminished.  Labs on Admission:  Basic Metabolic Panel:  Recent Labs Lab 03/21/15 1618 03/22/15 0037  NA 138 136  K 3.8 4.3  CL 104 109  CO2 21* 7*  GLUCOSE 134* 520*  BUN 19 14  CREATININE 0.68 0.75  CALCIUM 9.4 8.1*   Liver Function Tests:  Recent Labs Lab 03/21/15 1618  AST 26  ALT 16  ALKPHOS 96  BILITOT 0.6  PROT 8.2*  ALBUMIN 4.6    Recent Labs Lab 03/21/15 1618  LIPASE 19   No results for input(s): AMMONIA in the last 168 hours. CBC:  Recent Labs Lab 03/21/15 1618  WBC 8.2  HGB 14.0  HCT 41.9  MCV 93.1  PLT 424*   Cardiac Enzymes: No results for input(s): CKTOTAL, CKMB, CKMBINDEX, TROPONINI in the last 168 hours.  BNP (last 3 results) No results for input(s): BNP in the last 8760 hours.  ProBNP (last 3  results) No results for input(s): PROBNP in the last 8760 hours.  CBG:  Recent Labs Lab 03/21/15 1528 03/21/15 1628 03/21/15 2355  GLUCAP 84 99 398*    Radiological Exams on Admission: Dg Abd Acute W/chest  03/22/2015  CLINICAL DATA:  Fever, nausea, vomiting and diarrhea for 2 days. Status post cholecystectomy 2 months ago. History of pancreatitis, liver mass, diabetes. EXAM: DG ABDOMEN ACUTE W/ 1V CHEST COMPARISON:  Chest radiograph November 24, 2014 FINDINGS: Cardiomediastinal silhouette is normal. Lungs are clear, no pleural effusions. No pneumothorax. Soft tissue planes and included osseous structures are unremarkable. Paucity of small bowel gas. Scattered nondistended gas in the large bowel. Mild amount of retained large bowel stool. Surgical clips in the included right abdomen compatible with cholecystectomy. No intra-abdominal mass effect, pathologic calcifications or free air. Phleboliths project in the pelvis. Soft tissue planes and included osseous structures are non-suspicious. IMPRESSION: Normal chest.  Nonspecific bowel gas pattern. Electronically Signed   By: Elon Alas M.D.   On: 03/22/2015 02:07    EKG: Independently reviewed. Sinus tachycardia.  Assessment/Plan Principal Problem:   DKA (diabetic ketoacidoses) (HCC) Active Problems:   Nausea vomiting and diarrhea   1. Severe diabetic ketoacidosis - probably precipitated by patient's nausea vomiting and diarrhea. Patient has been placed on aggressive IV fluids has already received 4 L normal saline bolus. Continue with IV insulin infusion. Closely follow metabolic panel for any ongoing Correction. Months and Gets corrected changed to long-acting subcutaneous insulin. Check CK levels troponin. 2. Nausea vomiting and diarrhea - abdomen appears benign. LFTs and lipase are normal. Acute abdominal series is unremarkable. Will check stool for C. difficile since patient has had recent admission. 3. History of hypertension  presently on no medications. 4. History of asthma present he not wheezing.   DVT Prophylaxis Lovenox.  Code Status: Full code.  Family Communication: Discussed with patient.  Disposition Plan: Admit to inpatient.    Kapri Nero N. [facility] Pager [phone].  If 7PM-7AM, please contact night-coverage [email or web address] Password [email or web address] 03/22/2015, 2:44 AM

## 2015-03-22 NOTE — ED Notes (Signed)
0642 blood draw delayed, time conflict.  Previous sample was done at 0500.  RN made aware.

## 2015-03-22 NOTE — Progress Notes (Signed)
PROGRESS NOTE  [name] [id number] DOB: Jun 29, 1990 DOA: 03/21/2015 PCP: Vicenta Aly, FNP Brief History 25 year old female with a history of diabetes mellitus type 1, recent pancreatitis, liver and normal, anxiety, hepatic steatosis, asthma presented with 2-3 day history of nausea, vomiting, and diarrhea as well as generalized myalgias and arthralgias. The patient denies any hematochezia, melena, hematemesis. She denied any fevers or chills, but had a low-grade fever in the emergency department 99.24F. She denied any abdominal pain, dysuria, hematuria or headache, neck pain, chest pain, soreness of breath. She complains of some sinus congestion. She went to see her primary care provider on the day of admission, and was noted to have ketones in her urine. There was concern for DKA or other causes of ketosis, and the patient was sent to the emergency department. Initially, the patient had a serum glucose of 134 the time of admission, but the patient had stated that she had recently taken some NovoLog at home prior to coming to the emergency department. She endorses compliance with all her medications. Since arrival, the patient states that her diarrhea has improved. Repeat lab work in the emergency department revealed serum glucose of 123456 and metabolic acidosis with anion gap. Treatment was initiated for DKA.  The patient was also recently discharged from the hospital on January 24 after undergoing a laparoscopic cholecystectomy on 07/12/2015. Assessment/Plan: Diabetic ketoacidosis -Continue insulin drip -Continue aggressive fluid resuscitation -CBGs every hour -BMP every 4 hours -suspect initial labs on 03/21/15 may have been spurious -02/12/2015 hemoglobin A1c 12.2 Leukocytosis -Given the patient's low-grade fever, myalgias, arthralgias--check influenza -?stress demargination -Blood cultures 2 sets -Urinalysis negative for pyuria -Acute abdominal series revealed negative  chest x-ray and normal bowel gas pattern Asthma -No wheezing or stridor -Stable on room air -Continue LABA -continue singulair  Family Communication:   No family at beside Disposition Plan:   Home 2-3 days      Procedures/Studies: Dg Abd Acute W/chest  03/22/2015  CLINICAL DATA:  Fever, nausea, vomiting and diarrhea for 2 days. Status post cholecystectomy 2 months ago. History of pancreatitis, liver mass, diabetes. EXAM: DG ABDOMEN ACUTE W/ 1V CHEST COMPARISON:  Chest radiograph November 24, 2014 FINDINGS: Cardiomediastinal silhouette is normal. Lungs are clear, no pleural effusions. No pneumothorax. Soft tissue planes and included osseous structures are unremarkable. Paucity of small bowel gas. Scattered nondistended gas in the large bowel. Mild amount of retained large bowel stool. Surgical clips in the included right abdomen compatible with cholecystectomy. No intra-abdominal mass effect, pathologic calcifications or free air. Phleboliths project in the pelvis. Soft tissue planes and included osseous structures are non-suspicious. IMPRESSION: Normal chest. Nonspecific bowel gas pattern. Electronically Signed   By: Elon Alas M.D.   On: 03/22/2015 02:07         Subjective: Patient states the diarrhea has improved. Has not had any emesis and since 9 PM on 03/21/2015. Denies any fevers, chills, abdominal pain, dysuria, hematuria. Denies any headache or neck pain. No rashes or synovitis.  Objective: Filed Vitals:   03/22/15 0500 03/22/15 0634 03/22/15 0700 03/22/15 0730  BP: 143/99 114/88 115/82 109/74  Pulse: 129 130 114 106  Temp:      TempSrc:      Resp:  24 16 26   SpO2: 100% 100% 100% 99%    Intake/Output Summary (Last 24 hours) at 03/22/15 0813 Last data filed at 03/21/15 2354  Gross per 24 hour  Intake   4000 ml  Output  0 ml  Net   4000 ml   Weight change:  Exam:   General:  Pt is alert, follows commands appropriately, not in acute distress  HEENT: No  icterus, No thrush, No neck mass, [place]/AT  Cardiovascular: RRR, S1/S2, no rubs, no gallops  Respiratory: CTA bilaterally, no wheezing, no crackles, no rhonchi  Abdomen: Soft/+BS, non tender, non distended, no guarding; no hepatosplenomegaly  Extremities: No edema, No lymphangitis, No petechiae, No rashes, no synovitis  Data Reviewed: Basic Metabolic Panel:  Recent Labs Lab 03/21/15 1618 03/22/15 0037 03/22/15 0454  NA 138 136 139  K 3.8 4.3 5.1  CL 104 109 111  CO2 21* 7* <7*  GLUCOSE 134* 520* 475*  BUN 19 14 15   CREATININE 0.68 0.75 0.99  CALCIUM 9.4 8.1* 8.6*   Liver Function Tests:  Recent Labs Lab 03/21/15 1618  AST 26  ALT 16  ALKPHOS 96  BILITOT 0.6  PROT 8.2*  ALBUMIN 4.6    Recent Labs Lab 03/21/15 1618  LIPASE 19   No results for input(s): AMMONIA in the last 168 hours. CBC:  Recent Labs Lab 03/21/15 1618 03/22/15 0454  WBC 8.2 21.3*  HGB 14.0 14.5  HCT 41.9 46.3*  MCV 93.1 99.6  PLT 424* 467*   Cardiac Enzymes:  Recent Labs Lab 03/22/15 0454  CKTOTAL 66  TROPONINI <0.03   BNP: Invalid input(s): POCBNP CBG:  Recent Labs Lab 03/22/15 0250 03/22/15 0423 03/22/15 0531 03/22/15 0633 03/22/15 0750  GLUCAP 496* 475* 387* 264* 152*    No results found for this or any previous visit (from the past 240 hour(s)).   Scheduled Meds: . enoxaparin (LOVENOX) injection  40 mg Subcutaneous Q24H  . mometasone-formoterol  2 puff Inhalation BID   Continuous Infusions: . sodium chloride 1,000 mL (03/22/15 0319)  . dextrose 5 % and 0.45% NaCl    . insulin (NOVOLIN-R) infusion 1.8 Units/hr (03/22/15 0754)     Richard Ritchey, DO  [facility] Pager [phone]  If 7PM-7AM, please contact night-coverage [email or web address] Password [email or web address] 03/22/2015, 8:13 AM   LOS: 0 days

## 2015-03-22 NOTE — ED Notes (Signed)
2 attempt unable to collect labs

## 2015-03-22 NOTE — ED Notes (Addendum)
Pt reports that she is feeling "discomfort down there."  Sts she thinks that she has a yeast infection.  When asked if she had informed the provider, Pt sts "no, it just started."  Primary RN informed.

## 2015-03-23 DIAGNOSIS — R197 Diarrhea, unspecified: Secondary | ICD-10-CM

## 2015-03-23 DIAGNOSIS — R112 Nausea with vomiting, unspecified: Secondary | ICD-10-CM

## 2015-03-23 LAB — BASIC METABOLIC PANEL
Anion gap: 11 (ref 5–15)
Anion gap: 11 (ref 5–15)
Anion gap: 12 (ref 5–15)
Anion gap: 13 (ref 5–15)
BUN: 5 mg/dL — AB (ref 6–20)
BUN: 6 mg/dL (ref 6–20)
BUN: 8 mg/dL (ref 6–20)
BUN: 9 mg/dL (ref 6–20)
CHLORIDE: 108 mmol/L (ref 101–111)
CHLORIDE: 112 mmol/L — AB (ref 101–111)
CHLORIDE: 112 mmol/L — AB (ref 101–111)
CHLORIDE: 111 mmol/L (ref 101–111)
CO2: 13 mmol/L — AB (ref 22–32)
CO2: 14 mmol/L — ABNORMAL LOW (ref 22–32)
CO2: 14 mmol/L — AB (ref 22–32)
CO2: 14 mmol/L — ABNORMAL LOW (ref 22–32)
CREATININE: 0.63 mg/dL (ref 0.44–1.00)
CREATININE: 0.5 mg/dL (ref 0.44–1.00)
Calcium: 7.9 mg/dL — ABNORMAL LOW (ref 8.9–10.3)
Calcium: 8.3 mg/dL — ABNORMAL LOW (ref 8.9–10.3)
Calcium: 8.3 mg/dL — ABNORMAL LOW (ref 8.9–10.3)
Calcium: 8.6 mg/dL — ABNORMAL LOW (ref 8.9–10.3)
Creatinine, Ser: 0.51 mg/dL (ref 0.44–1.00)
Creatinine, Ser: 0.69 mg/dL (ref 0.44–1.00)
GFR calc non Af Amer: >60 mL/min (ref >60)
GFR calc non Af Amer: >60 mL/min (ref >60)
Glucose, Bld: 152 mg/dL — ABNORMAL HIGH (ref 65–99)
Glucose, Bld: 163 mg/dL — ABNORMAL HIGH (ref 65–99)
Glucose, Bld: 164 mg/dL — ABNORMAL HIGH (ref 65–99)
Glucose, Bld: 169 mg/dL — ABNORMAL HIGH (ref 65–99)
POTASSIUM: 3.4 mmol/L — AB (ref 3.5–5.1)
POTASSIUM: 3.8 mmol/L (ref 3.5–5.1)
POTASSIUM: 3.2 mmol/L — AB (ref 3.5–5.1)
Potassium: 4.8 mmol/L (ref 3.5–5.1)
SODIUM: 133 mmol/L — AB (ref 135–145)
SODIUM: 138 mmol/L (ref 135–145)
SODIUM: 137 mmol/L (ref 135–145)
Sodium: 137 mmol/L (ref 135–145)

## 2015-03-23 LAB — GLUCOSE, CAPILLARY
GLUCOSE-CAPILLARY: 181 mg/dL — AB (ref 65–99)
GLUCOSE-CAPILLARY: 203 mg/dL — AB (ref 65–99)
GLUCOSE-CAPILLARY: 190 mg/dL — AB (ref 65–99)
GLUCOSE-CAPILLARY: 108 mg/dL — AB (ref 65–99)
GLUCOSE-CAPILLARY: 128 mg/dL — AB (ref 65–99)
GLUCOSE-CAPILLARY: 158 mg/dL — AB (ref 65–99)
GLUCOSE-CAPILLARY: 131 mg/dL — AB (ref 65–99)
GLUCOSE-CAPILLARY: 252 mg/dL — AB (ref 65–99)
GLUCOSE-CAPILLARY: 151 mg/dL — AB (ref 65–99)
GLUCOSE-CAPILLARY: 233 mg/dL — AB (ref 65–99)
Glucose-Capillary: 121 mg/dL — ABNORMAL HIGH (ref 65–99)
Glucose-Capillary: 127 mg/dL — ABNORMAL HIGH (ref 65–99)
Glucose-Capillary: 147 mg/dL — ABNORMAL HIGH (ref 65–99)
Glucose-Capillary: 176 mg/dL — ABNORMAL HIGH (ref 65–99)
Glucose-Capillary: 167 mg/dL — ABNORMAL HIGH (ref 65–99)
Glucose-Capillary: 163 mg/dL — ABNORMAL HIGH (ref 65–99)
Glucose-Capillary: 200 mg/dL — ABNORMAL HIGH (ref 65–99)
Glucose-Capillary: 216 mg/dL — ABNORMAL HIGH (ref 65–99)

## 2015-03-23 LAB — CBC
HEMATOCRIT: 34.9 % — AB (ref 36.0–46.0)
HEMOGLOBIN: 11.6 g/dL — AB (ref 12.0–15.0)
MCH: 31.2 pg (ref 26.0–34.0)
MCHC: 33.2 g/dL (ref 30.0–36.0)
MCV: 93.8 fL (ref 78.0–100.0)
Platelets: 312 10*3/uL (ref 150–400)
RBC: 3.72 MIL/uL — ABNORMAL LOW (ref 3.87–5.11)
RDW: 14 % (ref 11.5–15.5)
WBC: 7.1 10*3/uL (ref 4.0–10.5)

## 2015-03-23 MED ORDER — POTASSIUM CHLORIDE 10 MEQ/100ML IV SOLN
10.0000 meq | INTRAVENOUS | Status: AC
  Administered 2015-03-23 (×2): 10 meq via INTRAVENOUS
  Filled 2015-03-23 (×2): qty 100

## 2015-03-23 MED ORDER — INSULIN ASPART 100 UNIT/ML ~~LOC~~ SOLN
0.0000 [IU] | Freq: Three times a day (TID) | SUBCUTANEOUS | Status: DC
  Administered 2015-03-23: 2 [IU] via SUBCUTANEOUS
  Administered 2015-03-24 – 2015-03-25 (×2): 1 [IU] via SUBCUTANEOUS
  Administered 2015-03-25: 2 [IU] via SUBCUTANEOUS

## 2015-03-23 MED ORDER — INSULIN GLARGINE 100 UNIT/ML ~~LOC~~ SOLN
24.0000 [IU] | Freq: Every day | SUBCUTANEOUS | Status: DC
  Administered 2015-03-23 – 2015-03-25 (×3): 24 [IU] via SUBCUTANEOUS
  Filled 2015-03-23 (×4): qty 0.24

## 2015-03-23 MED ORDER — SODIUM CHLORIDE 0.9 % IV SOLN: INTRAVENOUS | Status: DC

## 2015-03-23 MED ORDER — INSULIN ASPART 100 UNIT/ML ~~LOC~~ SOLN
0.0000 [IU] | Freq: Every day | SUBCUTANEOUS | Status: DC
  Administered 2015-03-23: 2 [IU] via SUBCUTANEOUS

## 2015-03-23 MED ORDER — SODIUM CHLORIDE 0.9 % IV SOLN
INTRAVENOUS | Status: DC
  Administered 2015-03-23: 125 mL/h via INTRAVENOUS
  Administered 2015-03-24: 12:00:00 via INTRAVENOUS

## 2015-03-23 MED ORDER — DIPHENHYDRAMINE HCL 50 MG/ML IJ SOLN
12.5000 mg | Freq: Once | INTRAMUSCULAR | Status: AC
  Administered 2015-03-23: 12.5 mg via INTRAVENOUS
  Filled 2015-03-23: qty 1

## 2015-03-23 NOTE — Progress Notes (Signed)
PROGRESS NOTE  [name] [id number] DOB: [birth year] DOA: 03/21/2015 PCP: Vicenta Aly, FNP Brief History 25 year old female with a history of diabetes mellitus type 1, recent pancreatitis, liver and normal, anxiety, hepatic steatosis, asthma presented with 2-3 day history of nausea, vomiting, and diarrhea as well as generalized myalgias and arthralgias. The patient denies any hematochezia, melena, hematemesis. She denied any fevers or chills, but had a low-grade fever in the emergency department 99.28F. She denied any abdominal pain, dysuria, hematuria or headache, neck pain, chest pain, soreness of breath. She complains of some sinus congestion. She went to see her primary care provider on the day of admission, and was noted to have ketones in her urine. There was concern for DKA or other causes of ketosis, and the patient was sent to the emergency department. Initially, the patient had a serum glucose of 134 the time of admission, but the patient had stated that she had recently taken some NovoLog at home prior to coming to the emergency department. She endorses compliance with all her medications. Since arrival, the patient states that her diarrhea has improved. Repeat lab work in the emergency department revealed serum glucose of 123456 and metabolic acidosis with anion gap. Treatment was initiated for DKA. The patient was also recently discharged from the hospital on January 24 after undergoing a laparoscopic cholecystectomy on 07/12/2015. Assessment/Plan: Diabetic ketoacidosis -Continue insulin drip-->transition to [place] insulin -start Lantus 24 units daily -start sensitive SSI -start diet -Continue aggressive fluid resuscitation -suspect initial labs on 03/21/15 may have been spurious -02/12/2015 hemoglobin A1c 12.2 Leukocytosis -Given the patient's low-grade fever, myalgias, arthralgias--check influenza -?stress demargination -Urinalysis negative for pyuria -Acute  abdominal series revealed negative chest x-ray and normal bowel gas pattern -am CBC Asthma -No wheezing or stridor -Stable on room air -Continue LABA -continue singulair Sinus tachycardia -overall improving -continue IVF -TSH  Family Communication: No family at beside Disposition Plan: Home 1-2 days; transfer to med surg      Procedures/Studies: Dg Abd Acute W/chest  03/22/2015  CLINICAL DATA:  Fever, nausea, vomiting and diarrhea for 2 days. Status post cholecystectomy 2 months ago. History of pancreatitis, liver mass, diabetes. EXAM: DG ABDOMEN ACUTE W/ 1V CHEST COMPARISON:  Chest radiograph November 24, 2014 FINDINGS: Cardiomediastinal silhouette is normal. Lungs are clear, no pleural effusions. No pneumothorax. Soft tissue planes and included osseous structures are unremarkable. Paucity of small bowel gas. Scattered nondistended gas in the large bowel. Mild amount of retained large bowel stool. Surgical clips in the included right abdomen compatible with cholecystectomy. No intra-abdominal mass effect, pathologic calcifications or free air. Phleboliths project in the pelvis. Soft tissue planes and included osseous structures are non-suspicious. IMPRESSION: Normal chest. Nonspecific bowel gas pattern. Electronically Signed   By: Elon Alas M.D.   On: 03/22/2015 02:07         Subjective: Patient complains of a headache. She denies any fevers, chills, chest pain, shortness breath, vomiting, diarrhea, abdominal pain. No dysuria or hematuria. No rashes.  Objective: Filed Vitals:   03/23/15 0000 03/23/15 0400 03/23/15 0700 03/23/15 0800  BP: 126/95 126/93  120/87  Pulse: 106 100 105 100  Temp: 98.2 F (36.8 C) 98.3 F (36.8 C)  98.1 F (36.7 C)  TempSrc: Oral Oral  Oral  Resp: 16 17 15 15   Height:      Weight:      SpO2: 100% 100% 100% 100%    Intake/Output Summary (Last 24 hours) at 03/23/15  1534 Last data filed at 03/23/15 0630  Gross per 24 hour  Intake  2790.95 ml  Output    900 ml  Net 1890.95 ml   Weight change:  Exam:   General:  Pt is alert, follows commands appropriately, not in acute distress  HEENT: No icterus, No thrush, No neck mass, [place]/AT  Cardiovascular: RRR, S1/S2, no rubs, no gallops  Respiratory: CTA bilaterally, no wheezing, no crackles, no rhonchi  Abdomen: Soft/+BS, non tender, non distended, no guarding; no hepatosplenomegaly  Extremities: No edema, No lymphangitis, No petechiae, No rashes, no synovitis  Data Reviewed: Basic Metabolic Panel:  Recent Labs Lab 03/22/15 1939 03/22/15 2353 03/23/15 0354 03/23/15 0840 03/23/15 1245  NA 136 138 137 137 133*  K 3.7 3.4* 3.2* 3.8 4.8  CL 112* 112* 111 112* 108  CO2 13* 13* 14* 14* 14*  GLUCOSE 198* 164* 163* 152* 169*  BUN 9 9 8 6  5*  CREATININE 0.56 0.69 0.51 0.63 0.50  CALCIUM 8.5* 8.6* 8.3* 8.3* 7.9*   Liver Function Tests:  Recent Labs Lab 03/21/15 1618  AST 26  ALT 16  ALKPHOS 96  BILITOT 0.6  PROT 8.2*  ALBUMIN 4.6    Recent Labs Lab 03/21/15 1618  LIPASE 19   No results for input(s): AMMONIA in the last 168 hours. CBC:  Recent Labs Lab 03/21/15 1618 03/22/15 0454 03/23/15 0354  WBC 8.2 21.3* 7.1  HGB 14.0 14.5 11.6*  HCT 41.9 46.3* 34.9*  MCV 93.1 99.6 93.8  PLT 424* 467* 312   Cardiac Enzymes:  Recent Labs Lab 03/22/15 0454  CKTOTAL 73  TROPONINI <0.03   BNP: Invalid input(s): POCBNP CBG:  Recent Labs Lab 03/23/15 0947 03/23/15 1119 03/23/15 1228 03/23/15 1334 03/23/15 1438  GLUCAP 158* 167* 163* 131* 252*    Recent Results (from the past 240 hour(s))  MRSA PCR Screening     Status: None   Collection Time: 03/22/15  5:57 PM  Result Value Ref Range Status   MRSA by PCR NEGATIVE NEGATIVE Final    Comment:        The GeneXpert MRSA Assay (FDA approved for NASAL specimens only), is one component of a comprehensive MRSA colonization surveillance program. It is not intended to diagnose MRSA infection  nor to guide or monitor treatment for MRSA infections.      Scheduled Meds: . antiseptic oral rinse  7 mL Mouth Rinse q12n4p  . chlorhexidine  15 mL Mouth Rinse BID  . clotrimazole  1 Applicatorful Vaginal QHS  . enoxaparin (LOVENOX) injection  40 mg Subcutaneous Q24H  . insulin aspart  0-5 Units Subcutaneous QHS  . insulin aspart  0-9 Units Subcutaneous TID WC  . insulin glargine  24 Units Subcutaneous Daily  . mometasone-formoterol  2 puff Inhalation BID   Continuous Infusions: . sodium chloride Stopped (03/22/15 0907)  . dextrose 5 % and 0.45% NaCl 125 mL/hr at 03/23/15 [id number]     Adrian Dinovo, DO  [facility] Pager [phone]  If 7PM-7AM, please contact night-coverage [email or web address] Password [email or web address] 03/23/2015, 3:34 PM   LOS: 1 day

## 2015-03-23 NOTE — Progress Notes (Addendum)
1940 BMP and 2340 BMP paged to [facility] upon resulting. No new orders received. 0400 BMP discussed with [facility]. Orders received.

## 2015-03-23 NOTE — Discharge Summary (Signed)
Physician Discharge Summary  [name] [id number] DOB: 12-Nov-1990 DOA: 03/21/2015  PCP: Vicenta Aly, FNP  Admit date: 03/21/2015 Discharge date: 03/24/15 Recommendations for Outpatient Follow-up:  1. Pt will need to follow up with PCP in 2 weeks post discharge 2. Please obtain BMP in one week  Discharge Diagnoses:  Diabetic ketoacidosis -Continue insulin drip-->transition to [place] insulin -start Lantus 24 units daily -start sensitive SSI -Once the patient's insulin was transitioned to subcutaneous insulin, the patient was started on a diet and her diet was advanced. -Continue aggressive fluid resuscitation -suspect initial labs on 03/21/15 may have been spurious -02/12/2015 hemoglobin A1c 12.2 -UDS positive for opiates Leukocytosis -Given the patient's low-grade fever, myalgias, arthralgias--check influenza--negative -?stress demargination -Urinalysis negative for pyuria -Acute abdominal series revealed negative chest x-ray and normal bowel gas pattern -am CBC Asthma -No wheezing or stridor -Stable on room air -Continue LABA -continue singulair Sinus tachycardia -overall improving -continue IVF -TSH Hypokalemia -Replete -Check magnesium  Discharge Condition: Stable  Disposition:  home  Diet: Carbohydrate modified Wt Readings from Last 3 Encounters:  03/22/15 52.9 kg (116 lb 10 oz)  02/11/15 53.978 kg (119 lb)  02/06/15 53.978 kg (119 lb)    History of present illness:  25 year old female with a history of diabetes mellitus type 1, recent pancreatitis, liver and normal, anxiety, hepatic steatosis, asthma presented with 2-3 day history of nausea, vomiting, and diarrhea as well as generalized myalgias and arthralgias. The patient denies any hematochezia, melena, hematemesis. She denied any fevers or chills, but had a low-grade fever in the emergency department 99.55F. She denied any abdominal pain, dysuria, hematuria or headache, neck pain, chest pain, soreness  of breath. She complains of some sinus congestion. She went to see her primary care provider on the day of admission, and was noted to have ketones in her urine. There was concern for DKA or other causes of ketosis, and the patient was sent to the emergency department. Initially, the patient had a serum glucose of 134 the time of admission, but the patient had stated that she had recently taken some NovoLog at home prior to coming to the emergency department. She endorses compliance with all her medications. Since arrival, the patient states that her diarrhea has improved. Repeat lab work in the emergency department revealed serum glucose of 123456 and metabolic acidosis with anion gap. Treatment was initiated for DKA. The patient was also recently discharged from the hospital on January 24 after undergoing a laparoscopic cholecystectomy on 07/12/2015. The patient was started on insulin drip and intravenous fluids. She improved clinically albeit slow. She was transitioned to subcutaneous insulin and her diet was advanced.  Discharge Exam: Filed Vitals:   03/23/15 1500 03/23/15 1600  BP:  136/99  Pulse: 117 100  Temp:    Resp: 27 20   Filed Vitals:   03/23/15 1300 03/23/15 1400 03/23/15 1500 03/23/15 1600  BP:    136/99  Pulse: 97 104 117 100  Temp:      TempSrc:      Resp: 14 27 27 20   Height:      Weight:      SpO2: 100% 99% 97% 100%   General: A&O x 3, NAD, pleasant, cooperative Cardiovascular: RRR, no rub, no gallop, no S3 Respiratory: CTAB, no wheeze, no rhonchi Abdomen:soft, nontender, nondistended, positive bowel sounds Extremities: No edema, No lymphangitis, no petechiae  Discharge Instructions     Medication List    STOP taking these medications        oxyCODONE  5 MG immediate release tablet  Commonly known as:  Oxy IR/ROXICODONE      TAKE these medications        albuterol 108 (90 Base) MCG/ACT inhaler  Commonly known as:  PROVENTIL HFA;VENTOLIN HFA  Inhale 2 puffs  into the lungs every 6 (six) hours as needed for shortness of breath.     albuterol (2.5 MG/3ML) 0.083% nebulizer solution  Commonly known as:  PROVENTIL  Take 2.5 mg by nebulization every 6 (six) hours as needed for shortness of breath.     budesonide-formoterol 160-4.5 MCG/ACT inhaler  Commonly known as:  SYMBICORT  Inhale 2 puffs into the lungs 2 (two) times daily as needed (SOB, wheezing).     EPIPEN 0.3 mg/0.3 mL Soaj injection  Generic drug:  EPINEPHrine  Inject 0.3 mg into the muscle once.     glucagon 1 MG injection  Inject 1 mg into the muscle as needed. FOR LOW BLOOD SUGAR     insulin aspart 100 UNIT/ML FlexPen  Commonly known as:  NOVOLOG FLEXPEN  INJECT 0-20 UNITS INTO THE SKIN AS DIRECTED. SSI - CARB SCALE, 0-20 UNITS     Insulin Glargine 100 UNIT/ML Solostar Pen  Commonly known as:  LANTUS SOLOSTAR  Inject 36 Units into the skin daily at 10 pm.     montelukast 10 MG tablet  Commonly known as:  SINGULAIR  Take 10 mg by mouth daily.         The results of significant diagnostics from this hospitalization (including imaging, microbiology, ancillary and laboratory) are listed below for reference.    Significant Diagnostic Studies: Dg Abd Acute W/chest  03/22/2015  CLINICAL DATA:  Fever, nausea, vomiting and diarrhea for 2 days. Status post cholecystectomy 2 months ago. History of pancreatitis, liver mass, diabetes. EXAM: DG ABDOMEN ACUTE W/ 1V CHEST COMPARISON:  Chest radiograph November 24, 2014 FINDINGS: Cardiomediastinal silhouette is normal. Lungs are clear, no pleural effusions. No pneumothorax. Soft tissue planes and included osseous structures are unremarkable. Paucity of small bowel gas. Scattered nondistended gas in the large bowel. Mild amount of retained large bowel stool. Surgical clips in the included right abdomen compatible with cholecystectomy. No intra-abdominal mass effect, pathologic calcifications or free air. Phleboliths project in the pelvis. Soft  tissue planes and included osseous structures are non-suspicious. IMPRESSION: Normal chest. Nonspecific bowel gas pattern. Electronically Signed   By: Elon Alas M.D.   On: 03/22/2015 02:07     Microbiology: Recent Results (from the past 240 hour(s))  MRSA PCR Screening     Status: None   Collection Time: 03/22/15  5:57 PM  Result Value Ref Range Status   MRSA by PCR NEGATIVE NEGATIVE Final    Comment:        The GeneXpert MRSA Assay (FDA approved for NASAL specimens only), is one component of a comprehensive MRSA colonization surveillance program. It is not intended to diagnose MRSA infection nor to guide or monitor treatment for MRSA infections.      Labs: Basic Metabolic Panel:  Recent Labs Lab 03/22/15 1939 03/22/15 2353 03/23/15 0354 03/23/15 0840 03/23/15 1245  NA 136 138 137 137 133*  K 3.7 3.4* 3.2* 3.8 4.8  CL 112* 112* 111 112* 108  CO2 13* 13* 14* 14* 14*  GLUCOSE 198* 164* 163* 152* 169*  BUN 9 9 8 6  5*  CREATININE 0.56 0.69 0.51 0.63 0.50  CALCIUM 8.5* 8.6* 8.3* 8.3* 7.9*   Liver Function Tests:  Recent Labs Lab 03/21/15 1618  AST 26  ALT 16  ALKPHOS 96  BILITOT 0.6  PROT 8.2*  ALBUMIN 4.6    Recent Labs Lab 03/21/15 1618  LIPASE 19   No results for input(s): AMMONIA in the last 168 hours. CBC:  Recent Labs Lab 03/21/15 1618 03/22/15 0454 03/23/15 0354  WBC 8.2 21.3* 7.1  HGB 14.0 14.5 11.6*  HCT 41.9 46.3* 34.9*  MCV 93.1 99.6 93.8  PLT 424* 467* 312   Cardiac Enzymes:  Recent Labs Lab 03/22/15 0454  CKTOTAL 66  TROPONINI <0.03   BNP: Invalid input(s): POCBNP CBG:  Recent Labs Lab 03/23/15 1119 03/23/15 1228 03/23/15 1334 03/23/15 1438 03/23/15 1553  GLUCAP 167* 163* 131* 252* 216*    Time coordinating discharge:  Greater than 30 minutes  Signed:  Canyon Willow, DO [facility] Pager: [phone] 03/23/2015, 6:03 PM

## 2015-03-23 NOTE — Progress Notes (Signed)
Pt. Arrived to floor via wheelchair from ICU. Alert and oriented x 4. No respiratory distress noted.

## 2015-03-24 LAB — COMPREHENSIVE METABOLIC PANEL
ALBUMIN: 2.9 g/dL — AB (ref 3.5–5.0)
ALT: 11 U/L — AB (ref 14–54)
AST: 21 U/L (ref 15–41)
Alkaline Phosphatase: 67 U/L (ref 38–126)
Anion gap: 9 (ref 5–15)
BILIRUBIN TOTAL: 0.6 mg/dL (ref 0.3–1.2)
CHLORIDE: 113 mmol/L — AB (ref 101–111)
CO2: 19 mmol/L — ABNORMAL LOW (ref 22–32)
CREATININE: 0.49 mg/dL (ref 0.44–1.00)
Calcium: 8.2 mg/dL — ABNORMAL LOW (ref 8.9–10.3)
GFR calc Af Amer: >60 mL/min (ref >60)
GLUCOSE: 114 mg/dL — AB (ref 65–99)
POTASSIUM: 3.3 mmol/L — AB (ref 3.5–5.1)
Sodium: 141 mmol/L (ref 135–145)
TOTAL PROTEIN: 5.4 g/dL — AB (ref 6.5–8.1)

## 2015-03-24 LAB — CBC
HCT: 32.8 % — ABNORMAL LOW (ref 36.0–46.0)
HEMOGLOBIN: 11 g/dL — AB (ref 12.0–15.0)
MCH: 30.8 pg (ref 26.0–34.0)
MCHC: 33.5 g/dL (ref 30.0–36.0)
MCV: 91.9 fL (ref 78.0–100.0)
Platelets: 273 10*3/uL (ref 150–400)
RBC: 3.57 MIL/uL — AB (ref 3.87–5.11)
RDW: 13.9 % (ref 11.5–15.5)
WBC: 4.2 10*3/uL (ref 4.0–10.5)

## 2015-03-24 LAB — GLUCOSE, CAPILLARY
GLUCOSE-CAPILLARY: 316 mg/dL — AB (ref 65–99)
GLUCOSE-CAPILLARY: 87 mg/dL (ref 65–99)
GLUCOSE-CAPILLARY: 102 mg/dL — AB (ref 65–99)
GLUCOSE-CAPILLARY: 108 mg/dL — AB (ref 65–99)
Glucose-Capillary: 351 mg/dL — ABNORMAL HIGH (ref 65–99)
Glucose-Capillary: 127 mg/dL — ABNORMAL HIGH (ref 65–99)

## 2015-03-24 LAB — TSH: TSH: 0.942 u[IU]/mL (ref 0.350–4.500)

## 2015-03-24 MED ORDER — SODIUM CHLORIDE 0.9 % IV SOLN
INTRAVENOUS | Status: DC
  Administered 2015-03-24 – 2015-03-25 (×2): via INTRAVENOUS
  Filled 2015-03-24 (×5): qty 1000

## 2015-03-24 MED ORDER — POTASSIUM CHLORIDE 10 MEQ/100ML IV SOLN
10.0000 meq | INTRAVENOUS | Status: AC
  Administered 2015-03-24 (×2): 10 meq via INTRAVENOUS
  Filled 2015-03-24 (×2): qty 100

## 2015-03-24 MED ORDER — DIPHENHYDRAMINE HCL 25 MG PO CAPS
25.0000 mg | ORAL_CAPSULE | Freq: Three times a day (TID) | ORAL | Status: DC | PRN
  Administered 2015-03-24 – 2015-03-25 (×2): 25 mg via ORAL
  Filled 2015-03-24 (×2): qty 1

## 2015-03-24 MED ORDER — POTASSIUM CHLORIDE CRYS ER 20 MEQ PO TBCR: 20.0000 meq | EXTENDED_RELEASE_TABLET | Freq: Once | ORAL | Status: DC

## 2015-03-24 NOTE — Progress Notes (Signed)
PROGRESS NOTE  [name] [id number] DOB: 05-27-1990 DOA: 03/21/2015 PCP: Vicenta Aly, FNP Brief History 25 year old female with a history of diabetes mellitus type 1, recent pancreatitis, liver and normal, anxiety, hepatic steatosis, asthma presented with 2-3 day history of nausea, vomiting, and diarrhea as well as generalized myalgias and arthralgias. The patient denies any hematochezia, melena, hematemesis. She denied any fevers or chills, but had a low-grade fever in the emergency department 99.80F. She denied any abdominal pain, dysuria, hematuria or headache, neck pain, chest pain, soreness of breath. She complains of some sinus congestion. She went to see her primary care provider on the day of admission, and was noted to have ketones in her urine. There was concern for DKA or other causes of ketosis, and the patient was sent to the emergency department. Initially, the patient had a serum glucose of 134 the time of admission, but the patient had stated that she had recently taken some NovoLog at home prior to coming to the emergency department. She endorses compliance with all her medications. Since arrival, the patient states that her diarrhea has improved. Repeat lab work in the emergency department revealed serum glucose of 123456 and metabolic acidosis with anion gap. Treatment was initiated for DKA. The patient was also recently discharged from the hospital on January 24 after undergoing a laparoscopic cholecystectomy on 02/11/2015. The patient was started on insulin drip and intravenous fluids. She improved clinically albeit slow. She was transitioned to subcutaneous insulin and her diet was advanced. Assessment/Plan: Diabetic ketoacidosis -Continue insulin drip-->transition to [place] insulin -continue Lantus 24 units daily -continue sensitive SSI -Once the patient's insulin was transitioned to subcutaneous insulin, the patient was started on a diet and her diet was  advanced. -03/24/14--pt endorsed vomiting, but did not notify RN -Continue  fluid resuscitation -suspect initial labs on 03/21/15 may have been spurious -02/12/2015 hemoglobin A1c 12.2 -UDS positive for opiates Nausea and vomiting -likely gastroparesis related to her DM -03/24/15--episode of emesis, RN could not verify -downgrade to clear liquid diet Leukocytosis -Given the patient's low-grade fever, myalgias, arthralgias--check influenza--negative -?stress demargination -Urinalysis negative for pyuria -Acute abdominal series revealed negative chest x-ray and normal bowel gas pattern -improved Asthma -No wheezing or stridor -Stable on room air -Continue LABA -continue singulair Sinus tachycardia -overall improving -continue IVF -TSH--0.942   Family Communication:   No family at beside Disposition Plan:   Home        Procedures/Studies: Dg Abd Acute W/chest  03/22/2015  CLINICAL DATA:  Fever, nausea, vomiting and diarrhea for 2 days. Status post cholecystectomy 2 months ago. History of pancreatitis, liver mass, diabetes. EXAM: DG ABDOMEN ACUTE W/ 1V CHEST COMPARISON:  Chest radiograph November 24, 2014 FINDINGS: Cardiomediastinal silhouette is normal. Lungs are clear, no pleural effusions. No pneumothorax. Soft tissue planes and included osseous structures are unremarkable. Paucity of small bowel gas. Scattered nondistended gas in the large bowel. Mild amount of retained large bowel stool. Surgical clips in the included right abdomen compatible with cholecystectomy. No intra-abdominal mass effect, pathologic calcifications or free air. Phleboliths project in the pelvis. Soft tissue planes and included osseous structures are non-suspicious. IMPRESSION: Normal chest. Nonspecific bowel gas pattern. Electronically Signed   By: Elon Alas M.D.   On: 03/22/2015 02:07         Subjective: Patient states that she had an episode of nausea and vomiting this morning. Denies any  fevers, chills, chest pain, shortness breath, diarrhea, hematochezia, melena. She complains  of intermittent headache. She says abdominal pain overall has improved.  Objective: Filed Vitals:   03/23/15 1800 03/23/15 1837 03/24/15 0530 03/24/15 1357  BP:  137/98 128/84 124/87  Pulse: 112 109 97 95  Temp:  98.9 F (37.2 C) 98.4 F (36.9 C) 98.8 F (37.1 C)  TempSrc:  Oral Oral Oral  Resp: 17 17 18 18   Height:      Weight:      SpO2: 100% 100% 100% 100%    Intake/Output Summary (Last 24 hours) at 03/24/15 1702 Last data filed at 03/24/15 1620  Gross per 24 hour  Intake 1586.25 ml  Output   1551 ml  Net  35.25 ml   Weight change:  Exam:   General:  Pt is alert, follows commands appropriately, not in acute distress  HEENT: No icterus, No thrush, No neck mass, [place]/AT  Cardiovascular: RRR, S1/S2, no rubs, no gallops  Respiratory: CTA bilaterally, no wheezing, no crackles, no rhonchi  Abdomen: Soft/+BS, non tender, non distended, no guarding; no hepatosplenomegaly  Extremities: No edema, No lymphangitis, No petechiae, No rashes, no synovitis  Data Reviewed: Basic Metabolic Panel:  Recent Labs Lab 03/22/15 2353 03/23/15 0354 03/23/15 0840 03/23/15 1245 03/24/15 0430  NA 138 137 137 133* 141  K 3.4* 3.2* 3.8 4.8 3.3*  CL 112* 111 112* 108 113*  CO2 13* 14* 14* 14* 19*  GLUCOSE 164* 163* 152* 169* 114*  BUN 9 8 6  5* <5*  CREATININE 0.69 0.51 0.63 0.50 0.49  CALCIUM 8.6* 8.3* 8.3* 7.9* 8.2*   Liver Function Tests:  Recent Labs Lab 03/21/15 1618 03/24/15 0430  AST 26 21  ALT 16 11*  ALKPHOS 96 67  BILITOT 0.6 0.6  PROT 8.2* 5.4*  ALBUMIN 4.6 2.9*    Recent Labs Lab 03/21/15 1618  LIPASE 19   No results for input(s): AMMONIA in the last 168 hours. CBC:  Recent Labs Lab 03/21/15 1618 03/22/15 0454 03/23/15 0354 03/24/15 0430  WBC 8.2 21.3* 7.1 4.2  HGB 14.0 14.5 11.6* 11.0*  HCT 41.9 46.3* 34.9* 32.8*  MCV 93.1 99.6 93.8 91.9  PLT 424* 467*  312 273   Cardiac Enzymes:  Recent Labs Lab 03/22/15 0454  CKTOTAL 61  TROPONINI <0.03   BNP: Invalid input(s): POCBNP CBG:  Recent Labs Lab 03/23/15 1553 03/23/15 1652 03/23/15 2217 03/24/15 0744 03/24/15 1135  GLUCAP 216* 151* 233* 87 127*    Recent Results (from the past 240 hour(s))  MRSA PCR Screening     Status: None   Collection Time: 03/22/15  5:57 PM  Result Value Ref Range Status   MRSA by PCR NEGATIVE NEGATIVE Final    Comment:        The GeneXpert MRSA Assay (FDA approved for NASAL specimens only), is one component of a comprehensive MRSA colonization surveillance program. It is not intended to diagnose MRSA infection nor to guide or monitor treatment for MRSA infections.      Scheduled Meds: . antiseptic oral rinse  7 mL Mouth Rinse q12n4p  . chlorhexidine  15 mL Mouth Rinse BID  . clotrimazole  1 Applicatorful Vaginal QHS  . enoxaparin (LOVENOX) injection  40 mg Subcutaneous Q24H  . insulin aspart  0-5 Units Subcutaneous QHS  . insulin aspart  0-9 Units Subcutaneous TID WC  . insulin glargine  24 Units Subcutaneous Daily  . mometasone-formoterol  2 puff Inhalation BID   Continuous Infusions: . sodium chloride 0.9 % 1,000 mL with potassium chloride 20 mEq infusion  125 mL/hr at 03/24/15 1358     Elder Davidian, DO  [facility] Pager [phone]  If 7PM-7AM, please contact night-coverage [email or web address] Password [email or web address] 03/24/2015, 5:02 PM   LOS: 2 days

## 2015-03-25 LAB — GLUCOSE, CAPILLARY
Glucose-Capillary: 167 mg/dL — ABNORMAL HIGH (ref 65–99)
Glucose-Capillary: 183 mg/dL — ABNORMAL HIGH (ref 65–99)

## 2015-03-25 LAB — BASIC METABOLIC PANEL
ANION GAP: 8 (ref 5–15)
CHLORIDE: 109 mmol/L (ref 101–111)
CO2: 22 mmol/L (ref 22–32)
Calcium: 8 mg/dL — ABNORMAL LOW (ref 8.9–10.3)
Creatinine, Ser: 0.48 mg/dL (ref 0.44–1.00)
GFR calc Af Amer: >60 mL/min (ref >60)
GLUCOSE: 219 mg/dL — AB (ref 65–99)
POTASSIUM: 3.6 mmol/L (ref 3.5–5.1)
Sodium: 139 mmol/L (ref 135–145)

## 2015-03-25 LAB — MAGNESIUM: Magnesium: 1.6 mg/dL — ABNORMAL LOW (ref 1.7–2.4)

## 2015-03-25 MED ORDER — MAGNESIUM SULFATE 2 GM/50ML IV SOLN
2.0000 g | Freq: Once | INTRAVENOUS | Status: AC
  Administered 2015-03-25: 2 g via INTRAVENOUS
  Filled 2015-03-25: qty 50

## 2015-03-25 MED ORDER — HYDROXYZINE HCL 50 MG/ML IM SOLN
25.0000 mg | Freq: Once | INTRAMUSCULAR | Status: AC
  Administered 2015-03-25: 25 mg via INTRAMUSCULAR
  Filled 2015-03-25: qty 0.5

## 2015-03-25 NOTE — Progress Notes (Signed)
Physician Discharge Summary  [name] [id number] DOB: 25-Aug-1990 DOA: 03/21/2015  PCP: Vicenta Aly, FNP  Admit date: 03/21/2015 Discharge date: 03/25/2015  Recommendations for Outpatient Follow-up:  1. Pt will need to follow up with PCP in 2 weeks post discharge 2. Please obtain BMP in one week  Discharge Diagnoses:  Diabetic ketoacidosis -Continue insulin drip-->transition to [place] insulin -continue Lantus 24 units daily -continue sensitive SSI--home with pt's previous sliding scale -Once the patient's insulin was transitioned to subcutaneous insulin, the patient was started on a diet and her diet was advanced. -03/24/14--pt endorsed vomiting, but did not notify RN -03/25/14--no further vomiting, diet advanced which patient tolerated -suspect initial labs on 03/21/15 may have been spurious -02/12/2015 hemoglobin A1c 12.2 -UDS positive for opiates Nausea and vomiting -likely gastroparesis related to her DM -03/24/15--episode of emesis, RN could not verify -downgrade to clear liquid diet-->advanced back to carb modified diet which she tolerated Leukocytosis -Given the patient's low-grade fever, myalgias, arthralgias--check influenza--negative -Due to stress demargination -Urinalysis negative for pyuria -Acute abdominal series revealed negative chest x-ray and normal bowel gas pattern -improved Asthma -No wheezing or stridor -Stable on room air -Continue LABA -continue singulair Sinus tachycardia -overall improving -continue IVF-->improved -TSH--0.942  Discharge Condition: stable  Disposition:  home Diet:carb modified Wt Readings from Last 3 Encounters:  03/22/15 52.9 kg (116 lb 10 oz)  02/11/15 53.978 kg (119 lb)  02/06/15 53.978 kg (119 lb)    History of present illness:  25 year old female with a history of diabetes mellitus type 1, recent pancreatitis, liver and normal, anxiety, hepatic steatosis, asthma presented with 2-3 day history of nausea, vomiting, and  diarrhea as well as generalized myalgias and arthralgias. The patient denies any hematochezia, melena, hematemesis. She denied any fevers or chills, but had a low-grade fever in the emergency department 99.58F. She denied any abdominal pain, dysuria, hematuria or headache, neck pain, chest pain, soreness of breath. She complains of some sinus congestion. She went to see her primary care provider on the day of admission, and was noted to have ketones in her urine. There was concern for DKA or other causes of ketosis, and the patient was sent to the emergency department. Initially, the patient had a serum glucose of 134 the time of admission, but the patient had stated that she had recently taken some NovoLog at home prior to coming to the emergency department. She endorses compliance with all her medications. Since arrival, the patient states that her diarrhea has improved. Repeat lab work in the emergency department revealed serum glucose of 123456 and metabolic acidosis with anion gap. Treatment was initiated for DKA. The patient was also recently discharged from the hospital on January 24 after undergoing a laparoscopic cholecystectomy on 02/11/2015. The patient was started on insulin drip and intravenous fluids. She improved clinically albeit slow. She was transitioned to subcutaneous insulin and her diet was advanced.  Discharge Exam: Filed Vitals:   03/24/15 2147 03/25/15 0605  BP: 127/79 136/83  Pulse: 88 95  Temp: 98.7 F (37.1 C) 99 F (37.2 C)  Resp: 18 18   Filed Vitals:   03/24/15 0530 03/24/15 1357 03/24/15 2147 03/25/15 0605  BP: 128/84 124/87 127/79 136/83  Pulse: 97 95 88 95  Temp: 98.4 F (36.9 C) 98.8 F (37.1 C) 98.7 F (37.1 C) 99 F (37.2 C)  TempSrc: Oral Oral Oral Oral  Resp: 18 18 18 18   Height:      Weight:      SpO2: 100% 100% 100% 97%  General: A&O x 3, NAD, pleasant, cooperative Cardiovascular: RRR, no rub, no gallop, no S3 Respiratory: CTAB, no wheeze, no  rhonchi Abdomen:soft, nontender, nondistended, positive bowel sounds Extremities: No edema, No lymphangitis, no petechiae  Discharge Instructions     Medication List    STOP taking these medications        oxyCODONE 5 MG immediate release tablet  Commonly known as:  Oxy IR/ROXICODONE      TAKE these medications        albuterol 108 (90 Base) MCG/ACT inhaler  Commonly known as:  PROVENTIL HFA;VENTOLIN HFA  Inhale 2 puffs into the lungs every 6 (six) hours as needed for shortness of breath.     albuterol (2.5 MG/3ML) 0.083% nebulizer solution  Commonly known as:  PROVENTIL  Take 2.5 mg by nebulization every 6 (six) hours as needed for shortness of breath.     budesonide-formoterol 160-4.5 MCG/ACT inhaler  Commonly known as:  SYMBICORT  Inhale 2 puffs into the lungs 2 (two) times daily as needed (SOB, wheezing).     EPIPEN 0.3 mg/0.3 mL Soaj injection  Generic drug:  EPINEPHrine  Inject 0.3 mg into the muscle once.     glucagon 1 MG injection  Inject 1 mg into the muscle as needed. FOR LOW BLOOD SUGAR     insulin aspart 100 UNIT/ML FlexPen  Commonly known as:  NOVOLOG FLEXPEN  INJECT 0-20 UNITS INTO THE SKIN AS DIRECTED. SSI - CARB SCALE, 0-20 UNITS     Insulin Glargine 100 UNIT/ML Solostar Pen  Commonly known as:  LANTUS SOLOSTAR  Inject 36 Units into the skin daily at 10 pm.     montelukast 10 MG tablet  Commonly known as:  SINGULAIR  Take 10 mg by mouth daily.         The results of significant diagnostics from this hospitalization (including imaging, microbiology, ancillary and laboratory) are listed below for reference.    Significant Diagnostic Studies: Dg Abd Acute W/chest  03/22/2015  CLINICAL DATA:  Fever, nausea, vomiting and diarrhea for 2 days. Status post cholecystectomy 2 months ago. History of pancreatitis, liver mass, diabetes. EXAM: DG ABDOMEN ACUTE W/ 1V CHEST COMPARISON:  Chest radiograph November 24, 2014 FINDINGS: Cardiomediastinal silhouette  is normal. Lungs are clear, no pleural effusions. No pneumothorax. Soft tissue planes and included osseous structures are unremarkable. Paucity of small bowel gas. Scattered nondistended gas in the large bowel. Mild amount of retained large bowel stool. Surgical clips in the included right abdomen compatible with cholecystectomy. No intra-abdominal mass effect, pathologic calcifications or free air. Phleboliths project in the pelvis. Soft tissue planes and included osseous structures are non-suspicious. IMPRESSION: Normal chest. Nonspecific bowel gas pattern. Electronically Signed   By: Elon Alas M.D.   On: 03/22/2015 02:07     Microbiology: Recent Results (from the past 240 hour(s))  MRSA PCR Screening     Status: None   Collection Time: 03/22/15  5:57 PM  Result Value Ref Range Status   MRSA by PCR NEGATIVE NEGATIVE Final    Comment:        The GeneXpert MRSA Assay (FDA approved for NASAL specimens only), is one component of a comprehensive MRSA colonization surveillance program. It is not intended to diagnose MRSA infection nor to guide or monitor treatment for MRSA infections.      Labs: Basic Metabolic Panel:  Recent Labs Lab 03/23/15 0354 03/23/15 0840 03/23/15 1245 03/24/15 0430 03/25/15 0328  NA 137 137 133* 141 139  K 3.2*  3.8 4.8 3.3* 3.6  CL 111 112* 108 113* 109  CO2 14* 14* 14* 19* 22  GLUCOSE 163* 152* 169* 114* 219*  BUN 8 6 5* <5* <5*  CREATININE 0.51 0.63 0.50 0.49 0.48  CALCIUM 8.3* 8.3* 7.9* 8.2* 8.0*  MG  --   --   --   --  1.6*   Liver Function Tests:  Recent Labs Lab 03/21/15 1618 03/24/15 0430  AST 26 21  ALT 16 11*  ALKPHOS 96 67  BILITOT 0.6 0.6  PROT 8.2* 5.4*  ALBUMIN 4.6 2.9*    Recent Labs Lab 03/21/15 1618  LIPASE 19   No results for input(s): AMMONIA in the last 168 hours. CBC:  Recent Labs Lab 03/21/15 1618 03/22/15 0454 03/23/15 0354 03/24/15 0430  WBC 8.2 21.3* 7.1 4.2  HGB 14.0 14.5 11.6* 11.0*  HCT  41.9 46.3* 34.9* 32.8*  MCV 93.1 99.6 93.8 91.9  PLT 424* 467* 312 273   Cardiac Enzymes:  Recent Labs Lab 03/22/15 0454  CKTOTAL 66  TROPONINI <0.03   BNP: Invalid input(s): POCBNP CBG:  Recent Labs Lab 03/24/15 0744 03/24/15 1135 03/24/15 1647 03/24/15 2140 03/25/15 0811  GLUCAP 87 127* 102* 108* 167*    Time coordinating discharge:  Greater than 30 minutes  Signed:  Abron Neddo, DO [facility] Pager: [phone] 03/25/2015, 1:28 PM

## 2015-03-25 NOTE — Progress Notes (Signed)
Discharge instructions reviewed with patient, questions answered, verbalized understanding.  Script given for blood glucose monitor.  Notified MD of BP 148/92, per MD okay for discharge.  RN transported patient via wheelchair to front of hospital to be taken home by her family member.

## 2015-03-25 NOTE — Progress Notes (Signed)
Inpatient Diabetes Program Recommendations  AACE/ADA: New Consensus Statement on Inpatient Glycemic Control (2015)  Target Ranges:  Prepandial:   less than 140 mg/dL      Peak postprandial:   less than 180 mg/dL (1-2 hours)      Critically ill patients:  140 - 180 mg/dL   Review of Glycemic Control  Results for NASTEHA, CORNE (MRN [id number]) as of 03/25/2015 13:05  Ref. Range 03/24/2015 11:35 03/24/2015 16:47 03/24/2015 21:40 03/25/2015 08:11  Glucose-Capillary Latest Ref Range: 65-99 mg/dL 127 (H) 102 (H) 108 (H) 167 (H)   Blood sugars acceptable. Just started CHO mod diet. Will need meal coverage insulin.  Inpatient Diabetes Program Recommendations:    Add Novolog 4 units tidwc for meal coverage insulin - titrate if post-prandial blood sugars > 180 mg/dL.  Will continue to follow. Thank you. Lorenda Peck, RD, LDN, CDE Inpatient Diabetes Coordinator [phone]

## 2015-04-08 ENCOUNTER — Encounter (HOSPITAL_COMMUNITY): Payer: Self-pay | Admitting: Emergency Medicine

## 2015-04-08 ENCOUNTER — Inpatient Hospital Stay (HOSPITAL_COMMUNITY)
Admission: EM | Admit: 2015-04-08 | Discharge: 2015-04-15 | DRG: 638 | Disposition: A | Discharge status: Home / Self Care | Payer: BLUE CROSS/BLUE SHIELD | Attending: Internal Medicine | Admitting: Internal Medicine

## 2015-04-08 DIAGNOSIS — J45909 Unspecified asthma, uncomplicated: Secondary | ICD-10-CM | POA: Diagnosis present

## 2015-04-08 DIAGNOSIS — Z79899 Other long term (current) drug therapy: Secondary | ICD-10-CM | POA: Diagnosis not present

## 2015-04-08 DIAGNOSIS — Z9049 Acquired absence of other specified parts of digestive tract: Secondary | ICD-10-CM

## 2015-04-08 DIAGNOSIS — Z794 Long term (current) use of insulin: Secondary | ICD-10-CM | POA: Diagnosis not present

## 2015-04-08 DIAGNOSIS — K920 Hematemesis: Secondary | ICD-10-CM | POA: Diagnosis not present

## 2015-04-08 DIAGNOSIS — IMO0002 Reserved for concepts with insufficient information to code with codable children: Secondary | ICD-10-CM | POA: Diagnosis present

## 2015-04-08 DIAGNOSIS — Z8249 Family history of ischemic heart disease and other diseases of the circulatory system: Secondary | ICD-10-CM | POA: Diagnosis not present

## 2015-04-08 DIAGNOSIS — I5032 Chronic diastolic (congestive) heart failure: Secondary | ICD-10-CM | POA: Diagnosis present

## 2015-04-08 DIAGNOSIS — E876 Hypokalemia: Secondary | ICD-10-CM | POA: Diagnosis not present

## 2015-04-08 DIAGNOSIS — E1043 Type 1 diabetes mellitus with diabetic autonomic (poly)neuropathy: Secondary | ICD-10-CM | POA: Diagnosis present

## 2015-04-08 DIAGNOSIS — R000 Tachycardia, unspecified: Secondary | ICD-10-CM | POA: Diagnosis present

## 2015-04-08 DIAGNOSIS — E1010 Type 1 diabetes mellitus with ketoacidosis without coma: Secondary | ICD-10-CM | POA: Diagnosis not present

## 2015-04-08 DIAGNOSIS — Z833 Family history of diabetes mellitus: Secondary | ICD-10-CM

## 2015-04-08 DIAGNOSIS — R030 Elevated blood-pressure reading, without diagnosis of hypertension: Secondary | ICD-10-CM | POA: Diagnosis not present

## 2015-04-08 DIAGNOSIS — K3184 Gastroparesis: Secondary | ICD-10-CM | POA: Diagnosis not present

## 2015-04-08 DIAGNOSIS — R1110 Vomiting, unspecified: Secondary | ICD-10-CM | POA: Diagnosis not present

## 2015-04-08 DIAGNOSIS — R112 Nausea with vomiting, unspecified: Secondary | ICD-10-CM | POA: Diagnosis present

## 2015-04-08 DIAGNOSIS — E1065 Type 1 diabetes mellitus with hyperglycemia: Secondary | ICD-10-CM | POA: Diagnosis present

## 2015-04-08 DIAGNOSIS — J4520 Mild intermittent asthma, uncomplicated: Secondary | ICD-10-CM | POA: Diagnosis not present

## 2015-04-08 LAB — COMPREHENSIVE METABOLIC PANEL
ALBUMIN: 4 g/dL (ref 3.5–5.0)
ALT: 14 U/L (ref 14–54)
ANION GAP: 17 — AB (ref 5–15)
AST: 19 U/L (ref 15–41)
Alkaline Phosphatase: 99 U/L (ref 38–126)
BUN: 14 mg/dL (ref 6–20)
CHLORIDE: 102 mmol/L (ref 101–111)
CO2: 16 mmol/L — AB (ref 22–32)
Calcium: 9.2 mg/dL (ref 8.9–10.3)
Creatinine, Ser: 0.52 mg/dL (ref 0.44–1.00)
GFR calc Af Amer: >60 mL/min (ref >60)
GFR calc non Af Amer: >60 mL/min (ref >60)
GLUCOSE: 394 mg/dL — AB (ref 65–99)
POTASSIUM: 4.5 mmol/L (ref 3.5–5.1)
SODIUM: 135 mmol/L (ref 135–145)
TOTAL PROTEIN: 7.3 g/dL (ref 6.5–8.1)
Total Bilirubin: 1.5 mg/dL — ABNORMAL HIGH (ref 0.3–1.2)

## 2015-04-08 LAB — URINALYSIS, ROUTINE W REFLEX MICROSCOPIC
BILIRUBIN URINE: NEGATIVE
Hgb urine dipstick: NEGATIVE
Ketones, ur: >80 mg/dL — AB
Leukocytes, UA: NEGATIVE
NITRITE: NEGATIVE
PH: 6 (ref 5.0–8.0)
Protein, ur: NEGATIVE mg/dL
SPECIFIC GRAVITY, URINE: 1.038 — AB (ref 1.005–1.030)

## 2015-04-08 LAB — LIPASE, BLOOD: Lipase: 18 U/L (ref 11–51)

## 2015-04-08 LAB — CBC
HEMATOCRIT: 41.3 % (ref 36.0–46.0)
HEMOGLOBIN: 13.7 g/dL (ref 12.0–15.0)
MCH: 31 pg (ref 26.0–34.0)
MCHC: 33.2 g/dL (ref 30.0–36.0)
MCV: 93.4 fL (ref 78.0–100.0)
Platelets: 380 10*3/uL (ref 150–400)
RBC: 4.42 MIL/uL (ref 3.87–5.11)
RDW: 13.2 % (ref 11.5–15.5)
WBC: 7.5 10*3/uL (ref 4.0–10.5)

## 2015-04-08 LAB — URINE MICROSCOPIC-ADD ON: RBC / HPF: NONE SEEN RBC/hpf (ref 0–5)

## 2015-04-08 LAB — OCCULT BLOOD GASTRIC / DUODENUM (SPECIMEN CUP)
Occult Blood, Gastric: POSITIVE — AB
pH, Gastric: 2

## 2015-04-08 LAB — PHOSPHORUS: Phosphorus: 2.3 mg/dL — ABNORMAL LOW (ref 2.5–4.6)

## 2015-04-08 LAB — CBG MONITORING, ED
GLUCOSE-CAPILLARY: 338 mg/dL — AB (ref 65–99)
GLUCOSE-CAPILLARY: 386 mg/dL — AB (ref 65–99)
GLUCOSE-CAPILLARY: 401 mg/dL — AB (ref 65–99)

## 2015-04-08 LAB — MAGNESIUM: Magnesium: 1.7 mg/dL (ref 1.7–2.4)

## 2015-04-08 LAB — I-STAT BETA HCG BLOOD, ED (MC, WL, AP ONLY): I-stat hCG, quantitative: <5 m[IU]/mL (ref <5)

## 2015-04-08 MED ORDER — SODIUM CHLORIDE 0.9 % IV BOLUS (SEPSIS)
1000.0000 mL | Freq: Once | INTRAVENOUS | Status: AC
  Administered 2015-04-08: 1000 mL via INTRAVENOUS

## 2015-04-08 MED ORDER — POTASSIUM CHLORIDE 10 MEQ/100ML IV SOLN
10.0000 meq | INTRAVENOUS | Status: AC
  Administered 2015-04-08 – 2015-04-09 (×2): 10 meq via INTRAVENOUS
  Filled 2015-04-08: qty 100

## 2015-04-08 MED ORDER — PANTOPRAZOLE SODIUM 40 MG IV SOLR
40.0000 mg | Freq: Two times a day (BID) | INTRAVENOUS | Status: DC
  Administered 2015-04-12 – 2015-04-15 (×7): 40 mg via INTRAVENOUS
  Filled 2015-04-08 (×7): qty 40

## 2015-04-08 MED ORDER — ONDANSETRON HCL 4 MG/2ML IJ SOLN
4.0000 mg | Freq: Four times a day (QID) | INTRAMUSCULAR | Status: DC | PRN
  Administered 2015-04-11 – 2015-04-13 (×3): 4 mg via INTRAVENOUS
  Filled 2015-04-08 (×7): qty 2

## 2015-04-08 MED ORDER — MORPHINE SULFATE (PF) 4 MG/ML IV SOLN
4.0000 mg | Freq: Once | INTRAVENOUS | Status: AC
  Administered 2015-04-08: 4 mg via INTRAVENOUS
  Filled 2015-04-08: qty 1

## 2015-04-08 MED ORDER — ONDANSETRON HCL 4 MG PO TABS: 4.0000 mg | ORAL_TABLET | Freq: Four times a day (QID) | ORAL | Status: DC | PRN

## 2015-04-08 MED ORDER — DEXTROSE-NACL 5-0.45 % IV SOLN: INTRAVENOUS | Status: DC

## 2015-04-08 MED ORDER — SODIUM CHLORIDE 0.9 % IV SOLN
INTRAVENOUS | Status: DC
  Administered 2015-04-08: 3.4 [IU]/h via INTRAVENOUS
  Administered 2015-04-09: 2.1 [IU]/h via INTRAVENOUS
  Administered 2015-04-12: 0.7 [IU]/h via INTRAVENOUS
  Filled 2015-04-08 (×2): qty 2.5

## 2015-04-08 MED ORDER — SODIUM CHLORIDE 0.9 % IV SOLN
80.0000 mg | Freq: Once | INTRAVENOUS | Status: AC
  Administered 2015-04-08: 80 mg via INTRAVENOUS
  Filled 2015-04-08: qty 80

## 2015-04-08 MED ORDER — ONDANSETRON 8 MG PO TBDP: 8.0000 mg | ORAL_TABLET | Freq: Once | ORAL | Status: DC

## 2015-04-08 MED ORDER — SODIUM CHLORIDE 0.9 % IV SOLN
8.0000 mg/h | INTRAVENOUS | Status: DC
  Administered 2015-04-08 – 2015-04-09 (×2): 8 mg/h via INTRAVENOUS
  Filled 2015-04-08 (×4): qty 80

## 2015-04-08 MED ORDER — DEXTROSE-NACL 5-0.45 % IV SOLN
INTRAVENOUS | Status: DC
  Administered 2015-04-09 – 2015-04-11 (×4): via INTRAVENOUS

## 2015-04-08 MED ORDER — SODIUM CHLORIDE 0.9 % IV SOLN
INTRAVENOUS | Status: AC
  Administered 2015-04-08: 23:00:00 via INTRAVENOUS

## 2015-04-08 MED ORDER — ONDANSETRON HCL 4 MG/2ML IJ SOLN
4.0000 mg | Freq: Four times a day (QID) | INTRAMUSCULAR | Status: DC | PRN
  Administered 2015-04-08 – 2015-04-11 (×7): 4 mg via INTRAVENOUS
  Filled 2015-04-08 (×3): qty 2

## 2015-04-08 MED ORDER — SODIUM CHLORIDE 0.9 % IV SOLN
INTRAVENOUS | Status: DC
  Administered 2015-04-08: 23:00:00 via INTRAVENOUS

## 2015-04-08 NOTE — ED Notes (Signed)
Warm blanket given

## 2015-04-08 NOTE — ED Notes (Signed)
IV attempt x2 unsuccessful. 2nd RN requested to bedside. Dr Olevia Bowens aware there is a delay on starting insulin drip.

## 2015-04-08 NOTE — ED Notes (Signed)
Occult gastric specimen from emesis, sent to lab

## 2015-04-08 NOTE — H&P (Signed)
[facility] History and Physical  Kylia Brubaker [id number] DOB: 02-14-1990 DOA: 04/08/2015  Referring physician: Milton Ferguson, MD PCP: Vicenta Aly, FNP   Chief Complaint: Vomiting and diarrhea.  HPI: [name] is a 25 y.o. female with a past medical history of type 1 diabetes, asthma, hypertension, heart murmur who comes emergency department due to nausea, vomiting and diarrhea since this morning.  Per mother, the patient started having nausea and emesis at home. Which was followed by diarrhea. Apparently she has not had any fevers and there are no known sick contacts. The patient states that she feels bodyaches and discomfort like she is having a DKA. Workup in the ER shows an elevated blood glucose, increased anion gap and ketonuria.  Review of Systems:  Unable to provide due to symptoms discomfort. History was provided by the patient's mother.  Past Medical History  Diagnosis Date  . Diabetes mellitus   . Asthma   . Pancreatitis, acute 11/26/2014  . Hepatic steatosis 11/26/2014  . Liver mass 11/26/2014  . Hypertension     NOT CURRENTLY ON ANY BP MED  . Heart murmur   . Arthritis   . Gallstones   . Anxiety    Past Surgical History  Procedure Laterality Date  . Wisdom tooth extraction    . Esophagogastroduodenoscopy (egd) with propofol Left 09/20/2014    Procedure: ESOPHAGOGASTRODUODENOSCOPY (EGD) WITH PROPOFOL;  Surgeon: Arta Silence, MD;  Location: [facility] ENDOSCOPY;  Service: Endoscopy;  Laterality: Left;  . Cholecystectomy N/A 02/11/2015    Procedure: LAPAROSCOPIC CHOLECYSTECTOMY WITH INTRAOPERATIVE CHOLANGIOGRAM;  Surgeon: Greer Pickerel, MD;  Location: [facility] ORS;  Service: General;  Laterality: N/A;   Social History:  reports that she has never smoked. She has never used smokeless tobacco. She reports that she uses illicit drugs (Marijuana). She reports that she does not drink alcohol.  Allergies  Allergen Reactions  . Peanut-Containing Drug Products  Swelling    Swelling of mouth, lips  . Strawberry Extract Swelling    Swelling of mouth, lips    Family History  Problem Relation Age of Onset  . Diabetes Mother   . Hyperlipidemia Mother   . Hypertension Father   . Heart disease Father   . Heart disease Maternal Grandmother   . Heart disease Maternal Grandfather   . Hypertension Paternal Grandmother   . Cancer Paternal Grandfather     Prior to Admission medications   Medication Sig Start Date End Date Taking? Authorizing Provider  albuterol (PROVENTIL HFA;VENTOLIN HFA) 108 (90 BASE) MCG/ACT inhaler Inhale 2 puffs into the lungs every 6 (six) hours as needed for shortness of breath.   Yes Historical Provider, MD  albuterol (PROVENTIL) (2.5 MG/3ML) 0.083% nebulizer solution Take 2.5 mg by nebulization every 6 (six) hours as needed for shortness of breath.   Yes Historical Provider, MD  budesonide-formoterol (SYMBICORT) 160-4.5 MCG/ACT inhaler Inhale 2 puffs into the lungs 2 (two) times daily as needed (SOB, wheezing).    Yes Historical Provider, MD  EPINEPHrine (EPIPEN) 0.3 mg/0.3 mL SOAJ injection Inject 0.3 mg into the muscle once.   Yes Historical Provider, MD  insulin aspart (NOVOLOG FLEXPEN) 100 UNIT/ML FlexPen INJECT 0-20 UNITS INTO THE SKIN AS DIRECTED. SSI - CARB SCALE, 0-20 UNITS Patient taking differently: Inject into the skin 4 (four) times daily as needed for high blood sugar. Sliding scale 10/19/13  Yes Elayne Snare, MD  Insulin Glargine (LANTUS SOLOSTAR) 100 UNIT/ML Solostar Pen Inject 36 Units into the skin daily at 10 pm. Patient taking differently: Inject  24 Units into the skin daily at 10 pm.  10/19/13  Yes Elayne Snare, MD  montelukast (SINGULAIR) 10 MG tablet Take 10 mg by mouth daily.   Yes Historical Provider, MD   Physical Exam: Filed Vitals:   04/08/15 1857 04/08/15 2103 04/08/15 2159 04/08/15 2200  BP: 132/93 124/91  130/85  Pulse: 111 109    Temp: 98.5 F (36.9 C)     TempSrc: Oral     Resp: 16 20  27   SpO2:  100% 100% 100%     Wt Readings from Last 3 Encounters:  03/22/15 52.9 kg (116 lb 10 oz)  02/11/15 53.978 kg (119 lb)  02/06/15 53.978 kg (119 lb)    General:  In acute distress due to nausea and abdominal pain. Eyes: PERRL, normal lids, irises & conjunctiva ENT: grossly normal hearing, lips & tongue. Oral mucosa is very dry. Neck: no LAD, masses or thyromegaly Cardiovascular: Tachycardic, no m/r/g. No LE edema. Telemetry: Sinus tachycardia at 106 bpm.  Respiratory: CTA bilaterally, no w/r/r. Normal respiratory effort. Abdomen: Bowel sounds +, soft, positive epigastric tenderness, no guarding, no rebound. Skin: no rash or induration seen on limited exam Musculoskeletal: grossly normal tone BUE/BLE Psychiatric: grossly normal mood and affect, speech fluent and appropriate Neurologic: Awake, alert, oriented 3 grossly non-focal.          Labs on Admission:  Basic Metabolic Panel:  Recent Labs Lab 04/08/15 1938  NA 135  K 4.5  CL 102  CO2 16*  GLUCOSE 394*  BUN 14  CREATININE 0.52  CALCIUM 9.2  MG 1.7  PHOS 2.3*   Liver Function Tests:  Recent Labs Lab 04/08/15 1938  AST 19  ALT 14  ALKPHOS 99  BILITOT 1.5*  PROT 7.3  ALBUMIN 4.0    Recent Labs Lab 04/08/15 1938  LIPASE 18   CBC:  Recent Labs Lab 04/08/15 1938  WBC 7.5  HGB 13.7  HCT 41.3  MCV 93.4  PLT 380   CBG:  Recent Labs Lab 04/08/15 2002 04/08/15 2241  GLUCAP 386* 36*    Assessment/Plan Principal Problem:   DKA, type 1 ([place])   Uncontrolled type 1 diabetes mellitus ([place]) Admit to a stepdown. Keep nothing by mouth for now. Zofran and/or Phenergan as needed for nausea or emesis. Continue IV fluids. Continue insulin infusion. Monitor anion gap, electrolytes, BUN/creatinine, CBG and phosphorus closely.  Active Problems:   Asthma Stable. Bronchodilators as needed.    Sinus tachycardia ([place]) Cardiac monitoring. Continue IV fluids and DKA treatment.    Nausea and  vomiting Zofran and/or Phenergan as needed for nausea or emesis.    Diarrhea. 3 episodes per patient. Continue IV fluids.    UGI bleed Patient had a small episode of hematemesis while in the ER. Keep nothing by mouth. Protonix loading dose and continues infusion. Monitor H&H. Please consult GI in a.m.    Code Status: Full code. DVT Prophylaxis: SCDs. Family Communication: Her mother was present in the room and provided history. Disposition Plan: Admit stepdown for IV fluids rehydration and insulin infusion treatment.  Time spent: Over 70 minutes were spent in the process of his admission.  Reubin Milan, M.D. [facility] Pager [phone].

## 2015-04-08 NOTE — ED Notes (Signed)
Per EMS, Pt from home, Pt c/o nausea, vomiting, diarrhea since this morning. Pt denies fevers. A&Ox4 and ambulatory.

## 2015-04-08 NOTE — ED Notes (Signed)
Pt given 500 mLs NS and 8 mg zofran by EMS

## 2015-04-08 NOTE — ED Notes (Signed)
Pt sts she is unable to give a urine sample at this time 

## 2015-04-08 NOTE — ED Provider Notes (Signed)
CSN: [id number]     Arrival date & time 04/08/15  [id number] History   First MD Initiated Contact with Patient 04/08/15 2117     Chief Complaint  Patient presents with  . Emesis  . Diarrhea     (Consider location/radiation/quality/duration/timing/severity/associated sxs/prior Treatment) Patient is a 25 y.o. female presenting with vomiting and diarrhea. The history is provided by the patient (Patient complains of vomiting and diarrhea.).  Emesis Severity:  Moderate Timing:  Constant Quality:  Bilious material Able to tolerate:  Liquids Progression:  Worsening Chronicity:  New Context: not post-tussive   Associated symptoms: abdominal pain and diarrhea   Associated symptoms: no headaches   Diarrhea Associated symptoms: abdominal pain and vomiting   Associated symptoms: no headaches     Past Medical History  Diagnosis Date  . Diabetes mellitus   . Asthma   . Pancreatitis, acute 11/26/2014  . Hepatic steatosis 11/26/2014  . Liver mass 11/26/2014  . Hypertension     NOT CURRENTLY ON ANY BP MED  . Heart murmur   . Arthritis   . Gallstones   . Anxiety    Past Surgical History  Procedure Laterality Date  . Wisdom tooth extraction    . Esophagogastroduodenoscopy (egd) with propofol Left 09/20/2014    Procedure: ESOPHAGOGASTRODUODENOSCOPY (EGD) WITH PROPOFOL;  Surgeon: Arta Silence, MD;  Location: [facility] ENDOSCOPY;  Service: Endoscopy;  Laterality: Left;  . Cholecystectomy N/A 02/11/2015    Procedure: LAPAROSCOPIC CHOLECYSTECTOMY WITH INTRAOPERATIVE CHOLANGIOGRAM;  Surgeon: Greer Pickerel, MD;  Location: [facility] ORS;  Service: General;  Laterality: N/A;   Family History  Problem Relation Age of Onset  . Diabetes Mother   . Hyperlipidemia Mother   . Hypertension Father   . Heart disease Father   . Heart disease Maternal Grandmother   . Heart disease Maternal Grandfather   . Hypertension Paternal Grandmother   . Cancer Paternal Grandfather    Social History  Substance Use Topics  .  Smoking status: Never Smoker   . Smokeless tobacco: Never Used  . Alcohol Use: No   OB History    Gravida Para Term Preterm AB TAB SAB Ectopic Multiple Living   2 1 0 1 1 1 0 0 0 1      Review of Systems  Constitutional: Negative for appetite change and fatigue.  HENT: Negative for congestion, ear discharge and sinus pressure.   Eyes: Negative for discharge.  Respiratory: Negative for cough.   Cardiovascular: Negative for chest pain.  Gastrointestinal: Positive for nausea, vomiting, abdominal pain and diarrhea.  Genitourinary: Negative for frequency and hematuria.  Musculoskeletal: Negative for back pain.  Skin: Negative for rash.  Neurological: Negative for seizures and headaches.  Psychiatric/Behavioral: Negative for hallucinations.      Allergies  Peanut-containing drug products and Strawberry extract  Home Medications   Prior to Admission medications   Medication Sig Start Date End Date Taking? Authorizing Provider  albuterol (PROVENTIL HFA;VENTOLIN HFA) 108 (90 BASE) MCG/ACT inhaler Inhale 2 puffs into the lungs every 6 (six) hours as needed for shortness of breath.    Historical Provider, MD  albuterol (PROVENTIL) (2.5 MG/3ML) 0.083% nebulizer solution Take 2.5 mg by nebulization every 6 (six) hours as needed for shortness of breath.    Historical Provider, MD  budesonide-formoterol (SYMBICORT) 160-4.5 MCG/ACT inhaler Inhale 2 puffs into the lungs 2 (two) times daily as needed (SOB, wheezing).     Historical Provider, MD  EPINEPHrine (EPIPEN) 0.3 mg/0.3 mL SOAJ injection Inject 0.3 mg into the muscle once.  Historical Provider, MD  glucagon 1 MG injection Inject 1 mg into the muscle as needed. FOR LOW BLOOD SUGAR 01/31/14 02/06/15  Historical Provider, MD  insulin aspart (NOVOLOG FLEXPEN) 100 UNIT/ML FlexPen INJECT 0-20 UNITS INTO THE SKIN AS DIRECTED. SSI - CARB SCALE, 0-20 UNITS Patient taking differently: Inject into the skin 4 (four) times daily as needed for high blood  sugar. Sliding scale 10/19/13   Elayne Snare, MD  Insulin Glargine (LANTUS SOLOSTAR) 100 UNIT/ML Solostar Pen Inject 36 Units into the skin daily at 10 pm. Patient taking differently: Inject 24 Units into the skin daily at 10 pm.  10/19/13   Elayne Snare, MD  montelukast (SINGULAIR) 10 MG tablet Take 10 mg by mouth daily.    Historical Provider, MD   BP 124/91 mmHg  Pulse 109  Temp(Src) 98.5 F (36.9 C) (Oral)  Resp 20  SpO2 100%  LMP 03/19/2015 Physical Exam  Constitutional: She is oriented to person, place, and time. She appears well-developed.  HENT:  Head: Normocephalic.  Eyes: Conjunctivae and EOM are normal. No scleral icterus.  Neck: Neck supple. No thyromegaly present.  Cardiovascular: Normal rate and regular rhythm.  Exam reveals no gallop and no friction rub.   No murmur heard. Pulmonary/Chest: No stridor. She has no wheezes. She has no rales. She exhibits no tenderness.  Abdominal: She exhibits no distension. There is tenderness. There is no rebound.  Musculoskeletal: Normal range of motion. She exhibits no edema.  Lymphadenopathy:    She has no cervical adenopathy.  Neurological: She is oriented to person, place, and time. She exhibits normal muscle tone. Coordination normal.  Skin: No rash noted. No erythema.  Psychiatric: She has a normal mood and affect. Her behavior is normal.    ED Course  Procedures (including critical care time) Labs Review Labs Reviewed  COMPREHENSIVE METABOLIC PANEL - Abnormal; Notable for the following:    CO2 16 (*)    Glucose, Bld 394 (*)    Total Bilirubin 1.5 (*)    Anion gap 17 (*)    All other components within normal limits  CBG MONITORING, ED - Abnormal; Notable for the following:    Glucose-Capillary 386 (*)    All other components within normal limits  LIPASE, BLOOD  CBC  URINALYSIS, ROUTINE W REFLEX MICROSCOPIC (NOT AT [facility])  I-STAT BETA HCG BLOOD, ED ([facility], [facility], [facility] ONLY)    Imaging Review No results found. I have personally  reviewed and evaluated these images and lab results as part of my medical decision-making.   EKG Interpretation None     CRITICAL CARE Performed by: Ananda Caya L Total critical care time: 35  minutes Critical care time was exclusive of separately billable procedures and treating other patients. Critical care was necessary to treat or prevent imminent or life-threatening deterioration. Critical care was time spent personally by me on the following activities: development of treatment plan with patient and/or surrogate as well as nursing, discussions with consultants, evaluation of patient's response to treatment, examination of patient, obtaining history from patient or surrogate, ordering and performing treatments and interventions, ordering and review of laboratory studies, ordering and review of radiographic studies, pulse oximetry and re-evaluation of patient's condition.  MDM   Final diagnoses:  Type 1 diabetes mellitus with ketoacidosis without coma ([place])    Patient with vomiting diarrhea. DKA she will be given insulin drip and be admitted to medicine    Milton Ferguson, MD 04/08/15 2204

## 2015-04-09 ENCOUNTER — Encounter (HOSPITAL_COMMUNITY): Payer: Self-pay

## 2015-04-09 LAB — BASIC METABOLIC PANEL
ANION GAP: 14 (ref 5–15)
ANION GAP: 13 (ref 5–15)
BUN: 13 mg/dL (ref 6–20)
BUN: 8 mg/dL (ref 6–20)
CALCIUM: 8.7 mg/dL — AB (ref 8.9–10.3)
CHLORIDE: 111 mmol/L (ref 101–111)
CO2: 15 mmol/L — AB (ref 22–32)
CO2: 16 mmol/L — ABNORMAL LOW (ref 22–32)
Calcium: 8.7 mg/dL — ABNORMAL LOW (ref 8.9–10.3)
Chloride: 104 mmol/L (ref 101–111)
Creatinine, Ser: 0.6 mg/dL (ref 0.44–1.00)
Creatinine, Ser: 0.42 mg/dL — ABNORMAL LOW (ref 0.44–1.00)
GFR calc Af Amer: >60 mL/min (ref >60)
GFR calc non Af Amer: >60 mL/min (ref >60)
GLUCOSE: 235 mg/dL — AB (ref 65–99)
GLUCOSE: 260 mg/dL — AB (ref 65–99)
Potassium: 4.3 mmol/L (ref 3.5–5.1)
Potassium: 3.6 mmol/L (ref 3.5–5.1)
Sodium: 140 mmol/L (ref 135–145)
Sodium: 133 mmol/L — ABNORMAL LOW (ref 135–145)

## 2015-04-09 LAB — GLUCOSE, CAPILLARY
GLUCOSE-CAPILLARY: 150 mg/dL — AB (ref 65–99)
GLUCOSE-CAPILLARY: 123 mg/dL — AB (ref 65–99)
Glucose-Capillary: 197 mg/dL — ABNORMAL HIGH (ref 65–99)
Glucose-Capillary: 149 mg/dL — ABNORMAL HIGH (ref 65–99)
Glucose-Capillary: 207 mg/dL — ABNORMAL HIGH (ref 65–99)

## 2015-04-09 LAB — CBG MONITORING, ED
GLUCOSE-CAPILLARY: 134 mg/dL — AB (ref 65–99)
GLUCOSE-CAPILLARY: 143 mg/dL — AB (ref 65–99)
GLUCOSE-CAPILLARY: 149 mg/dL — AB (ref 65–99)
GLUCOSE-CAPILLARY: 154 mg/dL — AB (ref 65–99)
GLUCOSE-CAPILLARY: 154 mg/dL — AB (ref 65–99)
GLUCOSE-CAPILLARY: 163 mg/dL — AB (ref 65–99)
GLUCOSE-CAPILLARY: 245 mg/dL — AB (ref 65–99)
GLUCOSE-CAPILLARY: 260 mg/dL — AB (ref 65–99)
Glucose-Capillary: 152 mg/dL — ABNORMAL HIGH (ref 65–99)
Glucose-Capillary: 119 mg/dL — ABNORMAL HIGH (ref 65–99)
Glucose-Capillary: 268 mg/dL — ABNORMAL HIGH (ref 65–99)

## 2015-04-09 LAB — CBC WITH DIFFERENTIAL/PLATELET
BASOS ABS: 0 10*3/uL (ref 0.0–0.1)
BASOS PCT: 0 %
EOS PCT: 0 %
Eosinophils Absolute: 0 10*3/uL (ref 0.0–0.7)
HCT: 36.2 % (ref 36.0–46.0)
Hemoglobin: 12.1 g/dL (ref 12.0–15.0)
Lymphocytes Relative: 19 %
Lymphs Abs: 1.8 10*3/uL (ref 0.7–4.0)
MCH: 31.3 pg (ref 26.0–34.0)
MCHC: 33.4 g/dL (ref 30.0–36.0)
MCV: 93.5 fL (ref 78.0–100.0)
MONO ABS: 0.5 10*3/uL (ref 0.1–1.0)
Monocytes Relative: 5 %
Neutro Abs: 7 10*3/uL (ref 1.7–7.7)
Neutrophils Relative %: 76 %
PLATELETS: 357 10*3/uL (ref 150–400)
RBC: 3.87 MIL/uL (ref 3.87–5.11)
RDW: 13.5 % (ref 11.5–15.5)
WBC: 9.3 10*3/uL (ref 4.0–10.5)

## 2015-04-09 LAB — COMPREHENSIVE METABOLIC PANEL
ALBUMIN: 3.4 g/dL — AB (ref 3.5–5.0)
ALT: 11 U/L — ABNORMAL LOW (ref 14–54)
AST: 15 U/L (ref 15–41)
Alkaline Phosphatase: 83 U/L (ref 38–126)
Anion gap: 9 (ref 5–15)
BUN: 11 mg/dL (ref 6–20)
CHLORIDE: 113 mmol/L — AB (ref 101–111)
CO2: 17 mmol/L — ABNORMAL LOW (ref 22–32)
Calcium: 8 mg/dL — ABNORMAL LOW (ref 8.9–10.3)
Creatinine, Ser: 0.45 mg/dL (ref 0.44–1.00)
GFR calc Af Amer: >60 mL/min (ref >60)
GLUCOSE: 165 mg/dL — AB (ref 65–99)
POTASSIUM: 3.8 mmol/L (ref 3.5–5.1)
Sodium: 139 mmol/L (ref 135–145)
Total Bilirubin: 0.9 mg/dL (ref 0.3–1.2)
Total Protein: 6.1 g/dL — ABNORMAL LOW (ref 6.5–8.1)

## 2015-04-09 LAB — PHOSPHORUS: Phosphorus: 2.3 mg/dL — ABNORMAL LOW (ref 2.5–4.6)

## 2015-04-09 LAB — MRSA PCR SCREENING: MRSA by PCR: NEGATIVE

## 2015-04-09 LAB — INFLUENZA PANEL BY PCR (TYPE A & B)
H1N1 flu by pcr: NOT DETECTED
Influenza A By PCR: NEGATIVE
Influenza B By PCR: NEGATIVE

## 2015-04-09 MED ORDER — PROMETHAZINE HCL 25 MG/ML IJ SOLN
12.5000 mg | Freq: Four times a day (QID) | INTRAMUSCULAR | Status: DC | PRN
  Administered 2015-04-09 – 2015-04-10 (×4): 12.5 mg via INTRAVENOUS
  Filled 2015-04-09 (×3): qty 1

## 2015-04-09 MED ORDER — METOCLOPRAMIDE HCL 5 MG/ML IJ SOLN
5.0000 mg | Freq: Three times a day (TID) | INTRAMUSCULAR | Status: DC
  Administered 2015-04-09 – 2015-04-10 (×3): 5 mg via INTRAVENOUS
  Filled 2015-04-09 (×3): qty 2

## 2015-04-09 MED ORDER — MORPHINE SULFATE (PF) 2 MG/ML IV SOLN
2.0000 mg | INTRAVENOUS | Status: DC | PRN
  Administered 2015-04-09 – 2015-04-14 (×23): 2 mg via INTRAVENOUS
  Filled 2015-04-09 (×23): qty 1

## 2015-04-09 NOTE — Progress Notes (Signed)
Inpatient Diabetes Program Recommendations  AACE/ADA: New Consensus Statement on Inpatient Glycemic Control (2015)  Target Ranges:  Prepandial:   less than 140 mg/dL      Peak postprandial:   less than 180 mg/dL (1-2 hours)      Critically ill patients:  140 - 180 mg/dL   Results for [name], [name] (MRN [id number]) as of 04/09/2015 13:44  Ref. Range 04/08/2015 19:38  Sodium Latest Ref Range: 135-145 mmol/L 135  Potassium Latest Ref Range: 3.5-5.1 mmol/L 4.5  Chloride Latest Ref Range: 101-111 mmol/L 102  CO2 Latest Ref Range: 22-32 mmol/L 16 (L)  BUN Latest Ref Range: 6-20 mg/dL 14  Creatinine Latest Ref Range: 0.44-1.00 mg/dL 0.52  Calcium Latest Ref Range: 8.9-10.3 mg/dL 9.2  EGFR (Non-African Amer.) Latest Ref Range: >60 mL/min >60  EGFR (African American) Latest Ref Range: >60 mL/min >60  Glucose Latest Ref Range: 65-99 mg/dL 394 (H)  Anion gap Latest Ref Range: 5-15  17 (H)    Admit with: DKA  History: Type 1 DM  Home DM Meds: Basaglar (insulin glargine) 24 units QHS        Novolog 1 unit for every 10 grams of carbohydrates        Novolog 1 unit for every 50 mg/dl above target CBG of 150 mg/dl  Current Insulin Orders: IV Insulin drip per DKA orders     -Note through review of Care Everywhere tab, patient last saw her Endocrinologist (Dr. Hartford Poli with [facility]) on 01/31/15. Insulins were adjusted to the above listed regimen.  -Note Dextrose added to IVF at 1:30am today per DKA protocol orders.     MD- When patient's CO2 is 20 or greater and Anion Gap has closed, please consider the following transition to SQ insulin recommendations:  1. Start home dose of basal insulin- Lantus 24 units daily (make sure to give 1st dose at least one hour before IV insulin drip stopped)  2. Start Novolog Sensitive Correction Scale/ SSI (0-9 units) TID AC + HS  3. Patient will also need Novolog Meal Coverage once she is eating- Can do a custom scale for her like she does at  home. 1 unit Novolog for every 10 grams of Carbohydrates.      --Will follow patient during hospitalization--  Wyn Quaker RN, MSN, CDE Diabetes Coordinator Inpatient Glycemic Control Team Team Pager: [phone] (8a-5p)

## 2015-04-09 NOTE — ED Notes (Signed)
Patient resting quietly, eyes closed, chest observed for rise and fall. Patient easily aroused for nursing tasks.

## 2015-04-09 NOTE — ED Notes (Signed)
Lab at bedside

## 2015-04-09 NOTE — ED Notes (Signed)
Pharmacy notfication: need iv protonix and iv dose of phenergan

## 2015-04-09 NOTE — ED Notes (Signed)
Insulin drip continued per dr. Stacie Acres.  Rate 2.1 per glucose stabilizer.

## 2015-04-09 NOTE — ED Notes (Signed)
Main lab requested for lab draws

## 2015-04-09 NOTE — ED Notes (Signed)
Dr. Stacie Acres paged x2. 1 - insulin gtt - do we continue? 2 - needs pain med for abdominal pain? 3 - Does she still need stepdown (no beds available)

## 2015-04-09 NOTE — Progress Notes (Addendum)
[name] pcp pt confirms she did not f/u with pcp since last admission  pt with 4 admissions in the last 6 months  Pt agreed to allow CM to set her up with a hospital follow up appt Cm spoke with Carly at her pcp office to schedule a 3 pm appt on 04/16/15 with teresa Lonni Fix also confirmed pt had not called the office for an appt since her 03/24/15 hospital admission  Pt updated on her up coming appt time and date [facility] hospital[facility] came to evaluate pt as Cm left room   Entered in d/c instructions  [name] Go on 04/16/2015 You have a hospital follow up appointment on 04/16/15 at 3 pm with [name] [place] [id number] [phone]

## 2015-04-09 NOTE — ED Notes (Signed)
Writer called main lab for am blood draws

## 2015-04-09 NOTE — Progress Notes (Signed)
Report called to Maudie Mercury, RN ICU.  Pt is to go to room 1233.  All questions answered.

## 2015-04-09 NOTE — Progress Notes (Signed)
[facility] PROGRESS NOTE  [name] [id number] DOB: 12-05-1990 DOA: 04/08/2015 PCP: Vicenta Aly, FNP  Assessment/Plan: 1. Diabetic ketoacidosis. -She presented to the emergency department on 04/08/2015 with complaints of intractable nausea vomiting associate with abdominal pain. -Initial lab work revealed elevated anion gap of 17 having a bicarbonate of 16 and urinalysis showing the presence of ketones -She was placed on the DKA protocol, treated with IV insulin, IV fluid administration, I like to let replacement. -She continues to have nausea and vomiting, unable to tolerate by mouth. -Last BMP showed an iron gap closing at 9, however, since she continues to have nausea vomiting will continue half-normal dextrose with IV insulin running.  2.  Intractable nausea and vomiting. -Lab work on admission showed lipase and transaminases within normal limits -This could be related to diabetic ketoacidosis although diabetic gastroparesis is also a possibility -Continue supportive care, IV antiemetic therapy (will schedule Reglan 5 mg IV 3 times a day) -Will check urine beta hCG  3.  Abdominal pain. -I suspect related to diabetic ketoacidosis and multiple episodes of nausea and vomiting. -Lipase and LFTs within normal limits.  4.  Question upper GI bleed. -She reported possibly having episode of hematemesis at home -I spoke with nursing staff who reported that she has not had hematemesis in the emergency department. I personally inspected emesis in the bag where she had been vomiting into and it was nonbloody and light colored.  -Her hemoglobin was stable at 12.1, which seems baseline for her. -Will continue to monitor and will consult GI if she has any episodes of hematemesis in the step down unit -Continue IV Protonix for now.  Code Status: Full code Family Communication:  Disposition Plan: She'll be admitted to the stepdown unit    HPI/Subjective: [name] is  a 25 year old female with a history of type 1 diabetes mellitus, presented to the emergency department on 04/08/2015 with complaints of intractable nausea and vomiting associated with abdominal pain. Lab work in the emergency department revealed a blood sugar of 394 with bicarbonate of 16 and an ANA gap of 17. She was treated for diabetic ketoacidosis, placed on DKA protocol, started on IV fluids, IV insulin with electrolyte correction.  Objective: Filed Vitals:   04/09/15 1428 04/09/15 1500  BP: 129/97 143/95  Pulse: 103 102  Temp: 98.3 F (36.8 C)   Resp: 22 15   No intake or output data in the 24 hours ending 04/09/15 1526 There were no vitals filed for this visit.  Exam:   General:  Ill-appearing, had several episodes of nausea vomiting while I was in the room, emesis was nonbloody and clear  Cardiovascular: Tachycardic, regular rhythm normal S1-S2  Respiratory: Normal respiratory effort lungs are clear to auscultation bilaterally  Abdomen: Having generalized pain to palpation, no rebound tenderness or guarding  Musculoskeletal: No edema  Data Reviewed: Basic Metabolic Panel:  Recent Labs Lab 04/08/15 1938 04/09/15 0120 04/09/15 0415  NA 135 140 139  K 4.5 4.3 3.8  CL 102 111 113*  CO2 16* 15* 17*  GLUCOSE 394* 235* 165*  BUN 14 13 11   CREATININE 0.52 0.60 0.45  CALCIUM 9.2 8.7* 8.0*  MG 1.7  --   --   PHOS 2.3* 2.3*  --    Liver Function Tests:  Recent Labs Lab 04/08/15 1938 04/09/15 0415  AST 19 15  ALT 14 11*  ALKPHOS 99 83  BILITOT 1.5* 0.9  PROT 7.3 6.1*  ALBUMIN 4.0 3.4*    Recent Labs  Lab 04/08/15 1938  LIPASE 18   No results for input(s): AMMONIA in the last 168 hours. CBC:  Recent Labs Lab 04/08/15 1938 04/09/15 0415  WBC 7.5 9.3  NEUTROABS  --  7.0  HGB 13.7 12.1  HCT 41.3 36.2  MCV 93.4 93.5  PLT 380 357   Cardiac Enzymes: No results for input(s): CKTOTAL, CKMB, CKMBINDEX, TROPONINI in the last 168 hours. BNP (last 3  results) No results for input(s): BNP in the last 8760 hours.  ProBNP (last 3 results) No results for input(s): PROBNP in the last 8760 hours.  CBG:  Recent Labs Lab 04/09/15 0931 04/09/15 1051 04/09/15 1214 04/09/15 1337 04/09/15 1502  GLUCAP 149* 134* 268* 260* 197*    No results found for this or any previous visit (from the past 240 hour(s)).   Studies: No results found.  Scheduled Meds: . [START ON 04/12/2015] pantoprazole (PROTONIX) IV  40 mg Intravenous Q12H   Continuous Infusions: . sodium chloride 150 mL/hr at 04/08/15 2322  . dextrose 5 % and 0.45% NaCl 125 mL/hr at 04/09/15 0951  . insulin (NOVOLIN-R) infusion 4.1 Units/hr (04/09/15 1508)  . pantoprozole (PROTONIX) infusion 8 mg/hr (04/09/15 1242)    Principal Problem:   DKA, type 1 ([place]) Active Problems:   Uncontrolled type 1 diabetes mellitus ([place])   Asthma   Sinus tachycardia ([facility])   Nausea and vomiting   UGI bleed    Time spent: 35 minutes    Kelvin Cellar  [facility] Pager [phone]. If 7PM-7AM, please contact night-coverage at [email or web address], password [facility] 04/09/2015, 3:26 PM  LOS: 1 day

## 2015-04-09 NOTE — ED Notes (Signed)
Main lab at bedside to attempt blood collection

## 2015-04-10 DIAGNOSIS — K3184 Gastroparesis: Secondary | ICD-10-CM

## 2015-04-10 LAB — GLUCOSE, CAPILLARY
GLUCOSE-CAPILLARY: 171 mg/dL — AB (ref 65–99)
GLUCOSE-CAPILLARY: 157 mg/dL — AB (ref 65–99)
GLUCOSE-CAPILLARY: 109 mg/dL — AB (ref 65–99)
GLUCOSE-CAPILLARY: 176 mg/dL — AB (ref 65–99)
GLUCOSE-CAPILLARY: 194 mg/dL — AB (ref 65–99)
GLUCOSE-CAPILLARY: 168 mg/dL — AB (ref 65–99)
GLUCOSE-CAPILLARY: 178 mg/dL — AB (ref 65–99)
GLUCOSE-CAPILLARY: 190 mg/dL — AB (ref 65–99)
GLUCOSE-CAPILLARY: 108 mg/dL — AB (ref 65–99)
GLUCOSE-CAPILLARY: 129 mg/dL — AB (ref 65–99)
Glucose-Capillary: 123 mg/dL — ABNORMAL HIGH (ref 65–99)
Glucose-Capillary: 140 mg/dL — ABNORMAL HIGH (ref 65–99)
Glucose-Capillary: 142 mg/dL — ABNORMAL HIGH (ref 65–99)
Glucose-Capillary: 232 mg/dL — ABNORMAL HIGH (ref 65–99)
Glucose-Capillary: 128 mg/dL — ABNORMAL HIGH (ref 65–99)
Glucose-Capillary: 150 mg/dL — ABNORMAL HIGH (ref 65–99)
Glucose-Capillary: 143 mg/dL — ABNORMAL HIGH (ref 65–99)
Glucose-Capillary: 140 mg/dL — ABNORMAL HIGH (ref 65–99)
Glucose-Capillary: 182 mg/dL — ABNORMAL HIGH (ref 65–99)
Glucose-Capillary: 215 mg/dL — ABNORMAL HIGH (ref 65–99)
Glucose-Capillary: 116 mg/dL — ABNORMAL HIGH (ref 65–99)
Glucose-Capillary: 163 mg/dL — ABNORMAL HIGH (ref 65–99)
Glucose-Capillary: 169 mg/dL — ABNORMAL HIGH (ref 65–99)
Glucose-Capillary: 168 mg/dL — ABNORMAL HIGH (ref 65–99)

## 2015-04-10 LAB — BASIC METABOLIC PANEL
ANION GAP: 12 (ref 5–15)
Anion gap: 8 (ref 5–15)
BUN: 6 mg/dL (ref 6–20)
BUN: <5 mg/dL — ABNORMAL LOW (ref 6–20)
CALCIUM: 8.7 mg/dL — AB (ref 8.9–10.3)
CHLORIDE: 106 mmol/L (ref 101–111)
CO2: 17 mmol/L — ABNORMAL LOW (ref 22–32)
CO2: 18 mmol/L — AB (ref 22–32)
CREATININE: 0.33 mg/dL — AB (ref 0.44–1.00)
Calcium: 8.6 mg/dL — ABNORMAL LOW (ref 8.9–10.3)
Chloride: 108 mmol/L (ref 101–111)
Creatinine, Ser: 0.36 mg/dL — ABNORMAL LOW (ref 0.44–1.00)
GFR calc Af Amer: >60 mL/min (ref >60)
GLUCOSE: 152 mg/dL — AB (ref 65–99)
Glucose, Bld: 154 mg/dL — ABNORMAL HIGH (ref 65–99)
POTASSIUM: 3.4 mmol/L — AB (ref 3.5–5.1)
Potassium: 3.3 mmol/L — ABNORMAL LOW (ref 3.5–5.1)
SODIUM: 135 mmol/L (ref 135–145)
Sodium: 134 mmol/L — ABNORMAL LOW (ref 135–145)

## 2015-04-10 LAB — CBC
HCT: 38.3 % (ref 36.0–46.0)
Hemoglobin: 13.1 g/dL (ref 12.0–15.0)
MCH: 31.5 pg (ref 26.0–34.0)
MCHC: 34.2 g/dL (ref 30.0–36.0)
MCV: 92.1 fL (ref 78.0–100.0)
PLATELETS: 280 10*3/uL (ref 150–400)
RBC: 4.16 MIL/uL (ref 3.87–5.11)
RDW: 13.2 % (ref 11.5–15.5)
WBC: 6.3 10*3/uL (ref 4.0–10.5)

## 2015-04-10 LAB — TYPE AND SCREEN
ABO/RH(D): O POS
ANTIBODY SCREEN: NEGATIVE

## 2015-04-10 LAB — ABO/RH: ABO/RH(D): O POS

## 2015-04-10 LAB — PREGNANCY, URINE: PREG TEST UR: NEGATIVE

## 2015-04-10 MED ORDER — HYDRALAZINE HCL 20 MG/ML IJ SOLN
10.0000 mg | INTRAMUSCULAR | Status: DC | PRN
  Administered 2015-04-10 – 2015-04-11 (×2): 10 mg via INTRAVENOUS
  Filled 2015-04-10 (×2): qty 1

## 2015-04-10 MED ORDER — SODIUM CHLORIDE 0.9 % IV BOLUS (SEPSIS)
1000.0000 mL | Freq: Once | INTRAVENOUS | Status: AC
  Administered 2015-04-10: 1000 mL via INTRAVENOUS

## 2015-04-10 MED ORDER — METOCLOPRAMIDE HCL 5 MG/ML IJ SOLN
10.0000 mg | Freq: Three times a day (TID) | INTRAMUSCULAR | Status: DC
  Administered 2015-04-10 – 2015-04-11 (×4): 10 mg via INTRAVENOUS
  Filled 2015-04-10 (×4): qty 2

## 2015-04-10 MED ORDER — PROMETHAZINE HCL 25 MG/ML IJ SOLN
25.0000 mg | Freq: Four times a day (QID) | INTRAMUSCULAR | Status: DC | PRN
  Administered 2015-04-10: 25 mg via INTRAVENOUS
  Administered 2015-04-10: 12.5 mg via INTRAVENOUS
  Administered 2015-04-11 (×2): 25 mg via INTRAVENOUS
  Filled 2015-04-10 (×4): qty 1

## 2015-04-10 MED ORDER — POTASSIUM CHLORIDE 10 MEQ/100ML IV SOLN
10.0000 meq | INTRAVENOUS | Status: AC
  Administered 2015-04-10 (×4): 10 meq via INTRAVENOUS
  Filled 2015-04-10 (×4): qty 100

## 2015-04-10 NOTE — Progress Notes (Signed)
Tolerates Ice chips with vomiting.

## 2015-04-10 NOTE — Progress Notes (Signed)
[facility] PROGRESS NOTE  Ronnika Pinch [id number] DOB: 1991-01-15 DOA: 04/08/2015 PCP: Vicenta Aly, FNP  Interim summary [name] is a 25 year old female with a history of type 1 diabetes mellitus, presented to the emergency department on 04/08/2015 with complaints of intractable nausea and vomiting associated with abdominal pain. Lab work in the emergency department revealed a blood sugar of 394 with bicarbonate of 16 and an ANA gap of 17. She was treated for diabetic ketoacidosis, placed on DKA protocol, started on IV fluids, IV insulin with electrolyte correction. Initially I believe that nausea vomiting was horribly related to DKA however symptoms persisting despite resolution of diabetic ketoacidosis. I wonder about the possibility of this reflecting a diabetic gastroparesis exacerbation. Given inability to advance her diet I have kept her on IV insulin with D 5 running. She is insulin-dependent. Now on scheduled Reglan 10 mg IV 3 times a day. May need NG tube placement, will reassess this afternoon.   Assessment/Plan: 1. Diabetic ketoacidosis. -She presented to the emergency department on 04/08/2015 with complaints of intractable nausea vomiting associate with abdominal pain. -Initial lab work revealed elevated anion gap of 17 having a bicarbonate of 16 and urinalysis showing the presence of ketones -She was placed on the DKA protocol, treated with IV insulin, IV fluid administration, I like to let replacement. -She continues to have nausea and vomiting, unable to tolerate by mouth. -Her DKA has resolved, however, since she continues to have nausea/vomiting precluding ability to advance diet, will continue half-normal dextrose with IV insulin running.   2.  Intractable nausea and vomiting secondary to diabetic gastroparesis exacerbation. -Lab work on admission showed lipase and transaminases within normal limits -Initially I thought this could be related to diabetic  ketoacidosis, however symptoms persist despite resolution of DKA. I think this could be secondary to diabetic gastroparesis exacerbation. -She continues to have symptoms for which will increase Reglan to 10 mg IV 3 times a day scheduled -Beta hCG negative -I think if there is no improvement may need to place NG tube  -Will reassess this afternoon  3.  Abdominal pain. -I suspect related to diabetic ketoacidosis and multiple episodes of nausea and vomiting. -Lipase and LFTs within normal limits.  4.  Question upper GI bleed. -She reported possibly having episode of hematemesis at home -I spoke with nursing staff who reported that she has not had hematemesis in the emergency department. I personally inspected emesis in the bag where she had been vomiting into and it was nonbloody and light colored.  -Her hemoglobin was stable at 12.1, which seems baseline for her. -Repeat labs on 04/10/2015 actually showing hemoglobin of 13.1 -Continue IV Protonix for now. Monitor.  Code Status: Full code Family Communication:  Disposition Plan: Continue close monitoring in the stepdown unit    HPI/Subjective: She reports ongoing nausea and vomiting  Objective: Filed Vitals:   04/10/15 0800 04/10/15 1200  BP: 142/98   Pulse: 104   Temp: 98.3 F (36.8 C) 98.8 F (37.1 C)  Resp: 22     Intake/Output Summary (Last 24 hours) at 04/10/15 1249 Last data filed at 04/10/15 1231  Gross per 24 hour  Intake 2484.16 ml  Output   2550 ml  Net -65.84 ml   Filed Weights   04/09/15 1500  Weight: 50.2 kg (110 lb 10.7 oz)    Exam:   General:  Ill-appearing, having dry heaves during my encounter.  Cardiovascular: Tachycardic, regular rhythm normal S1-S2  Respiratory: Normal respiratory effort lungs are clear to  auscultation bilaterally  Abdomen: Having generalized pain to palpation, no rebound tenderness or guarding  Musculoskeletal: No edema  Data Reviewed: Basic Metabolic Panel:  Recent  Labs Lab 04/08/15 1938 04/09/15 0120 04/09/15 0415 04/09/15 2021 04/10/15 0047 04/10/15 0550  NA 135 140 139 133* 134* 135  K 4.5 4.3 3.8 3.6 3.3* 3.4*  CL 102 111 113* 104 108 106  CO2 16* 15* 17* 16* 18* 17*  GLUCOSE 394* 235* 165* 260* 154* 152*  BUN 14 13 11 8 6  <5*  CREATININE 0.52 0.60 0.45 0.42* 0.33* 0.36*  CALCIUM 9.2 8.7* 8.0* 8.7* 8.7* 8.6*  MG 1.7  --   --   --   --   --   PHOS 2.3* 2.3*  --   --   --   --    Liver Function Tests:  Recent Labs Lab 04/08/15 1938 04/09/15 0415  AST 19 15  ALT 14 11*  ALKPHOS 99 83  BILITOT 1.5* 0.9  PROT 7.3 6.1*  ALBUMIN 4.0 3.4*    Recent Labs Lab 04/08/15 1938  LIPASE 18   No results for input(s): AMMONIA in the last 168 hours. CBC:  Recent Labs Lab 04/08/15 1938 04/09/15 0415 04/10/15 0550  WBC 7.5 9.3 6.3  NEUTROABS  --  7.0  --   HGB 13.7 12.1 13.1  HCT 41.3 36.2 38.3  MCV 93.4 93.5 92.1  PLT 380 357 280   Cardiac Enzymes: No results for input(s): CKTOTAL, CKMB, CKMBINDEX, TROPONINI in the last 168 hours. BNP (last 3 results) No results for input(s): BNP in the last 8760 hours.  ProBNP (last 3 results) No results for input(s): PROBNP in the last 8760 hours.  CBG:  Recent Labs Lab 04/10/15 0524 04/10/15 [id number] 04/10/15 0735 04/10/15 0842 04/10/15 1226  GLUCAP 128* 176* 194* 150* 143*    Recent Results (from the past 240 hour(s))  MRSA PCR Screening     Status: None   Collection Time: 04/09/15  3:10 PM  Result Value Ref Range Status   MRSA by PCR NEGATIVE NEGATIVE Final    Comment:        The GeneXpert MRSA Assay (FDA approved for NASAL specimens only), is one component of a comprehensive MRSA colonization surveillance program. It is not intended to diagnose MRSA infection nor to guide or monitor treatment for MRSA infections.      Studies: No results found.  Scheduled Meds: . metoCLOPramide (REGLAN) injection  10 mg Intravenous 3 times per day  . [START ON 04/12/2015]  pantoprazole (PROTONIX) IV  40 mg Intravenous Q12H   Continuous Infusions: . dextrose 5 % and 0.45% NaCl 100 mL/hr at 04/09/15 1722  . insulin (NOVOLIN-R) infusion 2.5 Units/hr (04/10/15 1231)    Principal Problem:   DKA, type 1 ([place]) Active Problems:   Uncontrolled type 1 diabetes mellitus ([place])   Asthma   Sinus tachycardia ([facility])   Nausea and vomiting   UGI bleed    Time spent: 35 minutes    Kelvin Cellar  [facility] Pager [phone]. If 7PM-7AM, please contact night-coverage at [email or web address], password [email or web address] 04/10/2015, 12:49 PM  LOS: 2 days

## 2015-04-11 ENCOUNTER — Inpatient Hospital Stay (HOSPITAL_COMMUNITY): Payer: BLUE CROSS/BLUE SHIELD

## 2015-04-11 DIAGNOSIS — E1043 Type 1 diabetes mellitus with diabetic autonomic (poly)neuropathy: Secondary | ICD-10-CM

## 2015-04-11 DIAGNOSIS — I5032 Chronic diastolic (congestive) heart failure: Secondary | ICD-10-CM | POA: Diagnosis present

## 2015-04-11 LAB — GLUCOSE, CAPILLARY
GLUCOSE-CAPILLARY: 162 mg/dL — AB (ref 65–99)
GLUCOSE-CAPILLARY: 134 mg/dL — AB (ref 65–99)
GLUCOSE-CAPILLARY: 186 mg/dL — AB (ref 65–99)
GLUCOSE-CAPILLARY: 136 mg/dL — AB (ref 65–99)
GLUCOSE-CAPILLARY: 357 mg/dL — AB (ref 65–99)
GLUCOSE-CAPILLARY: 251 mg/dL — AB (ref 65–99)
GLUCOSE-CAPILLARY: 171 mg/dL — AB (ref 65–99)
GLUCOSE-CAPILLARY: 183 mg/dL — AB (ref 65–99)
GLUCOSE-CAPILLARY: 159 mg/dL — AB (ref 65–99)
GLUCOSE-CAPILLARY: 157 mg/dL — AB (ref 65–99)
Glucose-Capillary: 129 mg/dL — ABNORMAL HIGH (ref 65–99)
Glucose-Capillary: 246 mg/dL — ABNORMAL HIGH (ref 65–99)
Glucose-Capillary: 179 mg/dL — ABNORMAL HIGH (ref 65–99)
Glucose-Capillary: 258 mg/dL — ABNORMAL HIGH (ref 65–99)
Glucose-Capillary: 134 mg/dL — ABNORMAL HIGH (ref 65–99)
Glucose-Capillary: 161 mg/dL — ABNORMAL HIGH (ref 65–99)
Glucose-Capillary: 160 mg/dL — ABNORMAL HIGH (ref 65–99)
Glucose-Capillary: 139 mg/dL — ABNORMAL HIGH (ref 65–99)
Glucose-Capillary: 137 mg/dL — ABNORMAL HIGH (ref 65–99)
Glucose-Capillary: 172 mg/dL — ABNORMAL HIGH (ref 65–99)
Glucose-Capillary: 177 mg/dL — ABNORMAL HIGH (ref 65–99)
Glucose-Capillary: 173 mg/dL — ABNORMAL HIGH (ref 65–99)

## 2015-04-11 LAB — BASIC METABOLIC PANEL
ANION GAP: 10 (ref 5–15)
Anion gap: 13 (ref 5–15)
Anion gap: 10 (ref 5–15)
BUN: <5 mg/dL — ABNORMAL LOW (ref 6–20)
CHLORIDE: 108 mmol/L (ref 101–111)
CHLORIDE: 107 mmol/L (ref 101–111)
CHLORIDE: 107 mmol/L (ref 101–111)
CO2: 18 mmol/L — ABNORMAL LOW (ref 22–32)
CO2: 17 mmol/L — AB (ref 22–32)
CO2: 17 mmol/L — AB (ref 22–32)
CREATININE: 0.46 mg/dL (ref 0.44–1.00)
CREATININE: 0.43 mg/dL — AB (ref 0.44–1.00)
CREATININE: 0.39 mg/dL — AB (ref 0.44–1.00)
Calcium: 8.4 mg/dL — ABNORMAL LOW (ref 8.9–10.3)
Calcium: 8.6 mg/dL — ABNORMAL LOW (ref 8.9–10.3)
Calcium: 8.3 mg/dL — ABNORMAL LOW (ref 8.9–10.3)
GFR calc Af Amer: >60 mL/min (ref >60)
GFR calc Af Amer: >60 mL/min (ref >60)
GFR calc non Af Amer: >60 mL/min (ref >60)
GFR calc non Af Amer: >60 mL/min (ref >60)
GFR calc non Af Amer: >60 mL/min (ref >60)
GLUCOSE: 176 mg/dL — AB (ref 65–99)
Glucose, Bld: 176 mg/dL — ABNORMAL HIGH (ref 65–99)
Glucose, Bld: 193 mg/dL — ABNORMAL HIGH (ref 65–99)
POTASSIUM: 2.7 mmol/L — AB (ref 3.5–5.1)
Potassium: 4 mmol/L (ref 3.5–5.1)
Potassium: 3.9 mmol/L (ref 3.5–5.1)
SODIUM: 136 mmol/L (ref 135–145)
Sodium: 137 mmol/L (ref 135–145)
Sodium: 134 mmol/L — ABNORMAL LOW (ref 135–145)

## 2015-04-11 LAB — CBC
HCT: 35 % — ABNORMAL LOW (ref 36.0–46.0)
Hemoglobin: 12.1 g/dL (ref 12.0–15.0)
MCH: 30.6 pg (ref 26.0–34.0)
MCHC: 34.6 g/dL (ref 30.0–36.0)
MCV: 88.6 fL (ref 78.0–100.0)
PLATELETS: 322 10*3/uL (ref 150–400)
RBC: 3.95 MIL/uL (ref 3.87–5.11)
RDW: 13 % (ref 11.5–15.5)
WBC: 7.7 10*3/uL (ref 4.0–10.5)

## 2015-04-11 LAB — BRAIN NATRIURETIC PEPTIDE: B Natriuretic Peptide: 43.5 pg/mL (ref 0.0–100.0)

## 2015-04-11 MED ORDER — PROCHLORPERAZINE 25 MG RE SUPP
25.0000 mg | Freq: Four times a day (QID) | RECTAL | Status: DC | PRN
  Administered 2015-04-11 – 2015-04-12 (×2): 25 mg via RECTAL
  Filled 2015-04-11 (×6): qty 1

## 2015-04-11 MED ORDER — POTASSIUM CHLORIDE 10 MEQ/100ML IV SOLN
10.0000 meq | INTRAVENOUS | Status: AC
  Administered 2015-04-11 (×6): 10 meq via INTRAVENOUS
  Filled 2015-04-11 (×5): qty 100

## 2015-04-11 MED ORDER — METOPROLOL TARTRATE 1 MG/ML IV SOLN
5.0000 mg | Freq: Four times a day (QID) | INTRAVENOUS | Status: DC
  Administered 2015-04-11 – 2015-04-15 (×16): 5 mg via INTRAVENOUS
  Filled 2015-04-11 (×16): qty 5

## 2015-04-11 MED ORDER — PROCHLORPERAZINE 25 MG RE SUPP
25.0000 mg | RECTAL | Status: AC
  Administered 2015-04-11: 25 mg via RECTAL
  Filled 2015-04-11 (×2): qty 1

## 2015-04-11 MED ORDER — METOCLOPRAMIDE HCL 5 MG/ML IJ SOLN
10.0000 mg | Freq: Four times a day (QID) | INTRAMUSCULAR | Status: DC
  Administered 2015-04-11 – 2015-04-15 (×15): 10 mg via INTRAVENOUS
  Filled 2015-04-11 (×15): qty 2

## 2015-04-11 MED ORDER — SODIUM CHLORIDE 0.9 % IV BOLUS (SEPSIS)
1000.0000 mL | Freq: Once | INTRAVENOUS | Status: AC
  Administered 2015-04-11: 1000 mL via INTRAVENOUS

## 2015-04-11 MED ORDER — PROMETHAZINE HCL 25 MG/ML IJ SOLN
12.5000 mg | INTRAMUSCULAR | Status: DC | PRN
  Administered 2015-04-12 – 2015-04-13 (×3): 12.5 mg via INTRAVENOUS
  Filled 2015-04-11 (×4): qty 1

## 2015-04-11 MED ORDER — KCL IN DEXTROSE-NACL 40-5-0.45 MEQ/L-%-% IV SOLN
INTRAVENOUS | Status: DC
  Administered 2015-04-11 – 2015-04-12 (×3): via INTRAVENOUS
  Filled 2015-04-11 (×3): qty 1000

## 2015-04-11 NOTE — Progress Notes (Signed)
Tolerated P.O. Denies nausea or vomiting at this time.

## 2015-04-11 NOTE — Progress Notes (Addendum)
CRITICAL VALUE ALERT  Critical value received:  k+  Date of notification:  04/11/15  Time of notification: 0420   Critical value read back yes   Nurse who received alert: Tye Maryland RN   MD notified (1st page): Baltazar Najjar   Time of first page: 0430  MD notified (2nd page):  Time of second page:  Responding MD:   Time MD responded: new orders received.

## 2015-04-11 NOTE — Progress Notes (Signed)
Pt vomited x 2; approximately 123ml of thin bilious fluid.  Pt has recently received Phenergan, Zofran, and Reglan.  Dr Lyman Speller aware.  Orders for Compazine supp received.  Will administer and monitor pt.

## 2015-04-11 NOTE — Progress Notes (Signed)
PROGRESS NOTE  [name] [id number] DOB: [birth year] DOA: 04/08/2015 PCP: Vicenta Aly, FNP  HPI/Recap of past 24 hours: Patient is a 25 year old female past oral history of poorly controlled type 1 diabetes mellitus admitted on 3/20 with 1 day of nausea plus vomiting plus abdominal pain and found to be in DKA area initially started on IV fluids and insulin and while anion gap DKA resolved, nausea vomiting persisted. Patient felt to also be in diabetic gastroparesis. Unable to eat to take by mouth, has been on insulin and dextrose and requiring Reglan, Phenergan and Zofran around-the-clock.  Patient this morning still having nausea and vomiting. Feels very weak.  Assessment/Plan: Principal Problem:   DKA, type 1 ([place]) in patient with uncontrolled type 1 diabetes mellitus with complications on long-term insulin: DKA resolved. Anion gap now normal. Continue insulin drip plus dextrose in IV fluids until she is able to take by mouth. A1c 3 months ago at 12.2. Rechecking. Active Problems:    Asthma: Stable   Sinus tachycardia ([place]): Have added beta blocker. I will he sees some of this may be from nausea vomiting and pain, although with diastolic heart failure, checking BNP  Hematemesis: Hemoglobin stable. Likely from some forceful vomiting. No evidence of bleed Diabetic gastroparesis with secondary nausea and vomiting: Continue symptomatic medications. Tried Compazine. Checking abdominal x-ray to ensure no other issues such as constipation Chronic diastolic heart failure: Noted elevated blood pressures given that we've been aggressively hydrating her, concerned about putting her into acute failure. Checking BNP.  Code Status: Full code   Family Communication: left message w/family   Disposition Plan: checking axr, may need NG tube    Consultants:  Diabetes coordinator   Procedures:  none   Antibiotics:  none    Objective: BP 150/89 mmHg  Pulse 110  Temp(Src)  98.4 F (36.9 C) (Oral)  Resp 20  Ht 5\' 4"  (1.626 m)  Wt 54.4 kg (119 lb 14.9 oz)  BMI 20.58 kg/m2  SpO2 100%  LMP 03/19/2015  Intake/Output Summary (Last 24 hours) at 04/11/15 1126 Last data filed at 04/11/15 1118  Gross per 24 hour  Intake 2558.47 ml  Output   2000 ml  Net 558.47 ml   Filed Weights   04/09/15 1500 04/11/15 0613  Weight: 50.2 kg (110 lb 10.7 oz) 54.4 kg (119 lb 14.9 oz)    Exam:   General:  Alert & oriented x3, mild distress from n/v   Cardiovascular: reg rhythm, tachy   Respiratory: Clear to auscultation bilaterally   Abdomen: Soft, generalized nonspecific tenderness, few bowel sounds   Musculoskeletal: No clubbing or cyanosis or edema    Data Reviewed: Basic Metabolic Panel:  Recent Labs Lab 04/08/15 1938 04/09/15 0120 04/09/15 0415 04/09/15 2021 04/10/15 0047 04/10/15 0550 04/11/15 0325  NA 135 140 139 133* 134* 135 136  K 4.5 4.3 3.8 3.6 3.3* 3.4* 2.7*  CL 102 111 113* 104 108 106 108  CO2 16* 15* 17* 16* 18* 17* 18*  GLUCOSE 394* 235* 165* 260* 154* 152* 176*  BUN 14 13 11 8 6  <5* <5*  CREATININE 0.52 0.60 0.45 0.42* 0.33* 0.36* 0.46  CALCIUM 9.2 8.7* 8.0* 8.7* 8.7* 8.6* 8.4*  MG 1.7  --   --   --   --   --   --   PHOS 2.3* 2.3*  --   --   --   --   --    Liver Function Tests:  Recent Labs Lab 04/08/15 1938 04/09/15  0415  AST 19 15  ALT 14 11*  ALKPHOS 99 83  BILITOT 1.5* 0.9  PROT 7.3 6.1*  ALBUMIN 4.0 3.4*    Recent Labs Lab 04/08/15 1938  LIPASE 18   No results for input(s): AMMONIA in the last 168 hours. CBC:  Recent Labs Lab 04/08/15 1938 04/09/15 0415 04/10/15 0550 04/11/15 0325  WBC 7.5 9.3 6.3 7.7  NEUTROABS  --  7.0  --   --   HGB 13.7 12.1 13.1 12.1  HCT 41.3 36.2 38.3 35.0*  MCV 93.4 93.5 92.1 88.6  PLT 380 357 280 322   Cardiac Enzymes:   No results for input(s): CKTOTAL, CKMB, CKMBINDEX, TROPONINI in the last 168 hours. BNP (last 3 results) No results for input(s): BNP in the last 8760  hours.  ProBNP (last 3 results) No results for input(s): PROBNP in the last 8760 hours.  CBG:  Recent Labs Lab 04/11/15 0256 04/11/15 0406 04/11/15 0521 04/11/15 0609 04/11/15 0729  GLUCAP 162* 134* 186* 179* 136*    Recent Results (from the past 240 hour(s))  MRSA PCR Screening     Status: None   Collection Time: 04/09/15  3:10 PM  Result Value Ref Range Status   MRSA by PCR NEGATIVE NEGATIVE Final    Comment:        The GeneXpert MRSA Assay (FDA approved for NASAL specimens only), is one component of a comprehensive MRSA colonization surveillance program. It is not intended to diagnose MRSA infection nor to guide or monitor treatment for MRSA infections.      Studies: No results found.  Scheduled Meds: . metoCLOPramide (REGLAN) injection  10 mg Intravenous 3 times per day  . metoprolol  5 mg Intravenous 4 times per day  . [START ON 04/12/2015] pantoprazole (PROTONIX) IV  40 mg Intravenous Q12H    Continuous Infusions: . dextrose 5 % and 0.45 % NaCl with KCl 40 mEq/L 100 mL/hr at 04/11/15 1044  . insulin (NOVOLIN-R) infusion 5.7 Units/hr (04/11/15 1034)     Time spent: 25 min   [facility] Pager [phone] . If 7PM-7AM, please contact night-coverage at [email or web address], password [facility] 04/11/2015, 11:26 AM  LOS: 3 days

## 2015-04-11 NOTE — Progress Notes (Signed)
Date:  April 12, 2015 Chart reviewed for concurrent status and case management needs. Will continue to follow patient for changes and needs: iv insulin drip and titeration Velva Harman, BSN, [place], [place]   [phone]

## 2015-04-11 NOTE — Progress Notes (Signed)
BMET results text paged to Dr Lyman Speller.  Await further orders.

## 2015-04-12 DIAGNOSIS — R000 Tachycardia, unspecified: Secondary | ICD-10-CM

## 2015-04-12 DIAGNOSIS — J4520 Mild intermittent asthma, uncomplicated: Secondary | ICD-10-CM

## 2015-04-12 DIAGNOSIS — R1110 Vomiting, unspecified: Secondary | ICD-10-CM

## 2015-04-12 DIAGNOSIS — E1010 Type 1 diabetes mellitus with ketoacidosis without coma: Principal | ICD-10-CM

## 2015-04-12 DIAGNOSIS — I5032 Chronic diastolic (congestive) heart failure: Secondary | ICD-10-CM

## 2015-04-12 DIAGNOSIS — K920 Hematemesis: Secondary | ICD-10-CM

## 2015-04-12 LAB — BASIC METABOLIC PANEL
Anion gap: 9 (ref 5–15)
Anion gap: 14 (ref 5–15)
Anion gap: 17 — ABNORMAL HIGH (ref 5–15)
Anion gap: 12 (ref 5–15)
Anion gap: 9 (ref 5–15)
BUN: <5 mg/dL — ABNORMAL LOW (ref 6–20)
BUN: <5 mg/dL — ABNORMAL LOW (ref 6–20)
CALCIUM: 8.7 mg/dL — AB (ref 8.9–10.3)
CALCIUM: 8.8 mg/dL — AB (ref 8.9–10.3)
CALCIUM: 9 mg/dL (ref 8.9–10.3)
CALCIUM: 8.4 mg/dL — AB (ref 8.9–10.3)
CHLORIDE: 106 mmol/L (ref 101–111)
CHLORIDE: 104 mmol/L (ref 101–111)
CHLORIDE: 105 mmol/L (ref 101–111)
CO2: 20 mmol/L — AB (ref 22–32)
CO2: 20 mmol/L — AB (ref 22–32)
CO2: 14 mmol/L — AB (ref 22–32)
CO2: 18 mmol/L — AB (ref 22–32)
CO2: 21 mmol/L — AB (ref 22–32)
CREATININE: 0.39 mg/dL — AB (ref 0.44–1.00)
CREATININE: 0.47 mg/dL (ref 0.44–1.00)
CREATININE: 0.55 mg/dL (ref 0.44–1.00)
CREATININE: 0.39 mg/dL — AB (ref 0.44–1.00)
CREATININE: 0.5 mg/dL (ref 0.44–1.00)
Calcium: 8.4 mg/dL — ABNORMAL LOW (ref 8.9–10.3)
Chloride: 104 mmol/L (ref 101–111)
Chloride: 107 mmol/L (ref 101–111)
GFR calc Af Amer: >60 mL/min (ref >60)
GFR calc Af Amer: >60 mL/min (ref >60)
GFR calc Af Amer: >60 mL/min (ref >60)
GFR calc non Af Amer: >60 mL/min (ref >60)
GFR calc non Af Amer: >60 mL/min (ref >60)
GFR calc non Af Amer: >60 mL/min (ref >60)
GFR calc non Af Amer: >60 mL/min (ref >60)
GLUCOSE: 242 mg/dL — AB (ref 65–99)
GLUCOSE: 265 mg/dL — AB (ref 65–99)
GLUCOSE: 400 mg/dL — AB (ref 65–99)
GLUCOSE: 152 mg/dL — AB (ref 65–99)
GLUCOSE: 206 mg/dL — AB (ref 65–99)
Potassium: 3.5 mmol/L (ref 3.5–5.1)
Potassium: 3.8 mmol/L (ref 3.5–5.1)
Potassium: 5.2 mmol/L — ABNORMAL HIGH (ref 3.5–5.1)
Potassium: 5.6 mmol/L — ABNORMAL HIGH (ref 3.5–5.1)
Potassium: 4 mmol/L (ref 3.5–5.1)
Sodium: 135 mmol/L (ref 135–145)
Sodium: 138 mmol/L (ref 135–145)
Sodium: 135 mmol/L (ref 135–145)
Sodium: 137 mmol/L (ref 135–145)
Sodium: 135 mmol/L (ref 135–145)

## 2015-04-12 LAB — GLUCOSE, CAPILLARY
GLUCOSE-CAPILLARY: 233 mg/dL — AB (ref 65–99)
GLUCOSE-CAPILLARY: 200 mg/dL — AB (ref 65–99)
GLUCOSE-CAPILLARY: 264 mg/dL — AB (ref 65–99)
GLUCOSE-CAPILLARY: 262 mg/dL — AB (ref 65–99)
GLUCOSE-CAPILLARY: 137 mg/dL — AB (ref 65–99)
GLUCOSE-CAPILLARY: 116 mg/dL — AB (ref 65–99)
GLUCOSE-CAPILLARY: 107 mg/dL — AB (ref 65–99)
GLUCOSE-CAPILLARY: 228 mg/dL — AB (ref 65–99)
Glucose-Capillary: 133 mg/dL — ABNORMAL HIGH (ref 65–99)
Glucose-Capillary: 134 mg/dL — ABNORMAL HIGH (ref 65–99)
Glucose-Capillary: 151 mg/dL — ABNORMAL HIGH (ref 65–99)
Glucose-Capillary: 154 mg/dL — ABNORMAL HIGH (ref 65–99)
Glucose-Capillary: 163 mg/dL — ABNORMAL HIGH (ref 65–99)
Glucose-Capillary: 175 mg/dL — ABNORMAL HIGH (ref 65–99)
Glucose-Capillary: 176 mg/dL — ABNORMAL HIGH (ref 65–99)
Glucose-Capillary: 176 mg/dL — ABNORMAL HIGH (ref 65–99)
Glucose-Capillary: 186 mg/dL — ABNORMAL HIGH (ref 65–99)
Glucose-Capillary: 283 mg/dL — ABNORMAL HIGH (ref 65–99)
Glucose-Capillary: 382 mg/dL — ABNORMAL HIGH (ref 65–99)

## 2015-04-12 LAB — BASIC METABOLIC PANEL WITH GFR
Anion gap: 8 (ref 5–15)
BUN: <5 mg/dL — ABNORMAL LOW (ref 6–20)
CO2: 19 mmol/L — ABNORMAL LOW (ref 22–32)
Calcium: 8.4 mg/dL — ABNORMAL LOW (ref 8.9–10.3)
Chloride: 107 mmol/L (ref 101–111)
Creatinine, Ser: 0.41 mg/dL — ABNORMAL LOW (ref 0.44–1.00)
GFR calc Af Amer: >60 mL/min
GFR calc non Af Amer: >60 mL/min
Glucose, Bld: 287 mg/dL — ABNORMAL HIGH (ref 65–99)
Potassium: 3.3 mmol/L — ABNORMAL LOW (ref 3.5–5.1)
Sodium: 134 mmol/L — ABNORMAL LOW (ref 135–145)

## 2015-04-12 MED ORDER — POTASSIUM CHLORIDE CRYS ER 20 MEQ PO TBCR
30.0000 meq | EXTENDED_RELEASE_TABLET | Freq: Once | ORAL | Status: AC
  Administered 2015-04-12: 30 meq via ORAL
  Filled 2015-04-12: qty 1

## 2015-04-12 MED ORDER — INSULIN ASPART 100 UNIT/ML ~~LOC~~ SOLN
0.0000 [IU] | Freq: Every day | SUBCUTANEOUS | Status: DC
  Administered 2015-04-12: 2 [IU] via SUBCUTANEOUS
  Administered 2015-04-13: 3 [IU] via SUBCUTANEOUS
  Administered 2015-04-14: 4 [IU] via SUBCUTANEOUS

## 2015-04-12 MED ORDER — INSULIN ASPART 100 UNIT/ML ~~LOC~~ SOLN
0.0000 [IU] | Freq: Three times a day (TID) | SUBCUTANEOUS | Status: DC
  Administered 2015-04-13: 7 [IU] via SUBCUTANEOUS
  Administered 2015-04-13: 5 [IU] via SUBCUTANEOUS
  Administered 2015-04-13: 2 [IU] via SUBCUTANEOUS
  Administered 2015-04-14: 5 [IU] via SUBCUTANEOUS
  Administered 2015-04-14 – 2015-04-15 (×2): 3 [IU] via SUBCUTANEOUS
  Administered 2015-04-15: 2 [IU] via SUBCUTANEOUS

## 2015-04-12 MED ORDER — INSULIN GLARGINE 100 UNIT/ML ~~LOC~~ SOLN
24.0000 [IU] | Freq: Every day | SUBCUTANEOUS | Status: DC
  Administered 2015-04-12 – 2015-04-14 (×3): 24 [IU] via SUBCUTANEOUS
  Filled 2015-04-12 (×4): qty 0.24

## 2015-04-12 NOTE — Progress Notes (Addendum)
Inpatient Diabetes Program Recommendations  AACE/ADA: New Consensus Statement on Inpatient Glycemic Control (2015)  Target Ranges:  Prepandial:   less than 140 mg/dL      Peak postprandial:   less than 180 mg/dL (1-2 hours)      Critically ill patients:  140 - 180 mg/dL   Review of Glycemic Control Admit with: DKA  History: Type 1 DM  Home DM Meds: Basaglar (insulin glargine) 24 units QHS  Novolog 1 unit for every 10 grams of carbohydrates  Novolog 1 unit for every 50 mg/dl above target CBG of 150 mg/dl  Current Insulin Orders: IV Insulin drip per DKA orders  Inpatient Diabetes Program Recommendations:   MD- When patient's CO2 is 20 or greater and Anion Gap has closed, please consider the following transition to SQ insulin recommendations:  1. Start home dose of basal insulin- Lantus 24 units daily (make sure to give 1st dose at least one hour before IV insulin drip stopped)  2. Start Novolog Sensitive Correction Scale/ SSI (0-9 units) TID AC + HS  3. Patient will also need Novolog Meal Coverage once she is eating- Can do a custom scale for her like she does at home. 1 unit Novolog for every 10 grams of Carbohydrates.  Attempted to speak with patient at bedside, but patient did not feel like speaking. States she just started seeing Dr. Hartford Poli for her endocrinologist.  Thank you, Nani Gasser. Amaiah Cristiano, RN, MSN, CDE Inpatient Glycemic Control Team Team Pager [phone] (8am-5pm) 04/12/2015 10:04 AM

## 2015-04-12 NOTE — Progress Notes (Signed)
[facility]                                                                              Patient Demographics  [name], is a 25 y.o. female, DOB - [birth year], [id number]  Admit date - 04/08/2015   Admitting Physician Reubin Milan, MD  Outpatient Primary MD for the patient is ANDERSON,TERESA, [place]  LOS - 4   Chief Complaint  Patient presents with  . Emesis  . Diarrhea      HPI on 04/08/2015 by Dr. Shanon Brow [name] is a 24 y.o. female with a past medical history of type 1 diabetes, asthma, hypertension, heart murmur who comes emergency department due to nausea, vomiting and diarrhea since this morning. Per mother, the patient started having nausea and emesis at home. Which was followed by diarrhea. Apparently she has not had any fevers and there are no known sick contacts. The patient states that she feels bodyaches and discomfort like she is having a DKA. Workup in the ER shows an elevated blood glucose, increased anion gap and ketonuria.  Interim history DKA resolved, anion gap closed. Patient continues to have nausea and vomiting and requiring antiemetics. Patient is not eating.   Assessment & Plan   Diabetic ketoacidosis in a patient with uncontrolled diabetes mellitus, type I -DKA has resolved, anion gap normal -Continues to be on insulin drip with dextrose and IV fluids as patient has been unable to take much by mouth and is actively vomiting -Hemoglobin A1c -Diabetes coordinator consulted  Nausea/vomiting/Diabetic gastroparesis -Continue antiemetics, Reglan -Abdominal x-ray unremarkable  Asthma -Stable  Sinus tachycardia -Likely secondary to continued nausea, vomiting and pain -Continue IV metoprolol  Chronic diastolic heart failure -BNP 43 -Echocardiogram 05/24/2014 showed EF [id number], grade 1 diastolic dysfunction -Currently euvolemic -monitor I/Os, daily weights  Hematemesis/upper GI bleed -Hemoglobin appears to be  stable, likely secondary to forceful vomiting -Continue IV Protonix -Continue to monitor CBC  Code Status: Full  Family Communication: None at bedside  Disposition Plan: admitted  Time Spent in minutes   30 minutes  Procedures  None  Consults   Diabetes coordinator  DVT Prophylaxis  SCDs  Lab Results  Component Value Date   PLT 322 04/11/2015    Medications  Scheduled Meds: . metoCLOPramide (REGLAN) injection  10 mg Intravenous 4 times per day  . metoprolol  5 mg Intravenous 4 times per day  . pantoprazole (PROTONIX) IV  40 mg Intravenous Q12H   Continuous Infusions: . dextrose 5 % and 0.45 % NaCl with KCl 40 mEq/L 100 mL/hr at 04/12/15 0629  . insulin (NOVOLIN-R) infusion 4.1 Units/hr (04/12/15 1041)   PRN Meds:.hydrALAZINE, morphine injection, ondansetron **OR** ondansetron (ZOFRAN) IV, prochlorperazine, promethazine  Antibiotics    Anti-infectives    None     Subjective:   [name] seen and examined today. Patient continues to complain of nausea and vomiting.  Denies chest pain, shortness of breath.    Objective:   Filed Vitals:   04/12/15 0200 04/12/15 0400 04/12/15 0600 04/12/15 0800  BP: 114/83 123/91 123/78 154/110  Pulse: 103 96 102 102  Temp:  99.1 F (37.3 C)  98.2 F (36.8 C)  TempSrc:  Oral  Oral  Resp: 12 19 14 11   Height:      Weight:  54.8 kg (120 lb 13 oz)    SpO2: 99% 100% 100% 100%    Wt Readings from Last 3 Encounters:  04/12/15 54.8 kg (120 lb 13 oz)  03/22/15 52.9 kg (116 lb 10 oz)  02/11/15 53.978 kg (119 lb)     Intake/Output Summary (Last 24 hours) at 04/12/15 1124 Last data filed at 04/12/15 0815  Gross per 24 hour  Intake 2222.84 ml  Output   2925 ml  Net -702.16 ml    Exam  General: Well developed, well nourished, mild distress  HEENT: NCAT,mucous membranes moist.   Cardiovascular: S1 S2 auscultated, tachycardic  Respiratory: Clear to auscultation bilaterally  Abdomen: Soft, generalized TTP,  nondistended, + bowel sounds  Extremities: warm dry without cyanosis clubbing or edema  Neuro: AAOx3, nonfocal  Psych: Normal affect and demeanor   Data Review   Micro Results Recent Results (from the past 240 hour(s))  MRSA PCR Screening     Status: None   Collection Time: 04/09/15  3:10 PM  Result Value Ref Range Status   MRSA by PCR NEGATIVE NEGATIVE Final    Comment:        The GeneXpert MRSA Assay (FDA approved for NASAL specimens only), is one component of a comprehensive MRSA colonization surveillance program. It is not intended to diagnose MRSA infection nor to guide or monitor treatment for MRSA infections.     Radiology Reports Dg Abd 2 Views  04/11/2015  CLINICAL DATA:  Intractable nausea and vomiting, abdominal pain EXAM: ABDOMEN - 2 VIEW COMPARISON:  None. FINDINGS: The bowel gas pattern is normal. There is no evidence of free air. No radio-opaque calculi or other significant radiographic abnormality is seen. IMPRESSION: Negative. Electronically Signed   By: Kathreen Devoid   On: 04/11/2015 13:51   Dg Abd Acute W/chest  03/22/2015  CLINICAL DATA:  Fever, nausea, vomiting and diarrhea for 2 days. Status post cholecystectomy 2 months ago. History of pancreatitis, liver mass, diabetes. EXAM: DG ABDOMEN ACUTE W/ 1V CHEST COMPARISON:  Chest radiograph November 24, 2014 FINDINGS: Cardiomediastinal silhouette is normal. Lungs are clear, no pleural effusions. No pneumothorax. Soft tissue planes and included osseous structures are unremarkable. Paucity of small bowel gas. Scattered nondistended gas in the large bowel. Mild amount of retained large bowel stool. Surgical clips in the included right abdomen compatible with cholecystectomy. No intra-abdominal mass effect, pathologic calcifications or free air. Phleboliths project in the pelvis. Soft tissue planes and included osseous structures are non-suspicious. IMPRESSION: Normal chest. Nonspecific bowel gas pattern. Electronically  Signed   By: Elon Alas M.D.   On: 03/22/2015 02:07    CBC  Recent Labs Lab 04/08/15 1938 04/09/15 0415 04/10/15 0550 04/11/15 0325  WBC 7.5 9.3 6.3 7.7  HGB 13.7 12.1 13.1 12.1  HCT 41.3 36.2 38.3 35.0*  PLT 380 357 280 322  MCV 93.4 93.5 92.1 88.6  MCH 31.0 31.3 31.5 30.6  MCHC 33.2 33.4 34.2 34.6  RDW 13.2 13.5 13.2 13.0  LYMPHSABS  --  1.8  --   --   MONOABS  --  0.5  --   --   EOSABS  --  0.0  --   --   BASOSABS  --  0.0  --   --     Chemistries   Recent Labs Lab 04/08/15 1938  04/09/15 0415  04/11/15 1350 04/11/15 2006 04/12/15 0124  04/12/15 0547 04/12/15 1001  NA 135  < > 139  < > 137 134* 134* 135 138  K 4.5  < > 3.8  < > 4.0 3.9 3.3* 3.5 3.8  CL 102  < > 113*  < > 107 107 107 106 104  CO2 16*  < > 17*  < > 17* 17* 19* 20* 20*  GLUCOSE 394*  < > 165*  < > 193* 176* 287* 242* 265*  BUN 14  < > 11  < > <5* <5* <5* <5* <5*  CREATININE 0.52  < > 0.45  < > 0.43* 0.39* 0.41* 0.39* 0.47  CALCIUM 9.2  < > 8.0*  < > 8.6* 8.3* 8.4* 8.4* 8.7*  MG 1.7  --   --   --   --   --   --   --   --   AST 19  --  15  --   --   --   --   --   --   ALT 14  --  11*  --   --   --   --   --   --   ALKPHOS 99  --  83  --   --   --   --   --   --   BILITOT 1.5*  --  0.9  --   --   --   --   --   --   < > = values in this interval not displayed. ------------------------------------------------------------------------------------------------------------------ estimated creatinine clearance is 92.8 mL/min (by C-G formula based on Cr of 0.47). ------------------------------------------------------------------------------------------------------------------ No results for input(s): HGBA1C in the last 72 hours. ------------------------------------------------------------------------------------------------------------------ No results for input(s): CHOL, HDL, LDLCALC, TRIG, CHOLHDL, LDLDIRECT in the last 72  hours. ------------------------------------------------------------------------------------------------------------------ No results for input(s): TSH, T4TOTAL, T3FREE, THYROIDAB in the last 72 hours.  Invalid input(s): FREET3 ------------------------------------------------------------------------------------------------------------------ No results for input(s): VITAMINB12, FOLATE, FERRITIN, TIBC, IRON, RETICCTPCT in the last 72 hours.  Coagulation profile No results for input(s): INR, PROTIME in the last 168 hours.  No results for input(s): DDIMER in the last 72 hours.  Cardiac Enzymes No results for input(s): CKMB, TROPONINI, MYOGLOBIN in the last 168 hours.  Invalid input(s): CK ------------------------------------------------------------------------------------------------------------------ Invalid input(s): POCBNP    Goodwin Kamphaus D.O. on 04/12/2015 at 11:24 AM  Between 7am to 7pm - Pager - [phone]  After 7pm go to [email or web address] - password [email or web address]  And look for the night coverage person covering for me after hours  [facility] Office  [phone]

## 2015-04-13 LAB — CBC
HCT: 41.8 % (ref 36.0–46.0)
Hemoglobin: 14.4 g/dL (ref 12.0–15.0)
MCH: 30.8 pg (ref 26.0–34.0)
MCHC: 34.4 g/dL (ref 30.0–36.0)
MCV: 89.3 fL (ref 78.0–100.0)
PLATELETS: 270 10*3/uL (ref 150–400)
RBC: 4.68 MIL/uL (ref 3.87–5.11)
RDW: 13.2 % (ref 11.5–15.5)
WBC: 7.8 10*3/uL (ref 4.0–10.5)

## 2015-04-13 LAB — BASIC METABOLIC PANEL
ANION GAP: 10 (ref 5–15)
ANION GAP: 16 — AB (ref 5–15)
ANION GAP: 12 (ref 5–15)
ANION GAP: 10 (ref 5–15)
Anion gap: 15 (ref 5–15)
Anion gap: 10 (ref 5–15)
BUN: <5 mg/dL — ABNORMAL LOW (ref 6–20)
BUN: <5 mg/dL — ABNORMAL LOW (ref 6–20)
BUN: <5 mg/dL — ABNORMAL LOW (ref 6–20)
BUN: <5 mg/dL — ABNORMAL LOW (ref 6–20)
BUN: <5 mg/dL — ABNORMAL LOW (ref 6–20)
CALCIUM: 8.7 mg/dL — AB (ref 8.9–10.3)
CALCIUM: 9 mg/dL (ref 8.9–10.3)
CALCIUM: 8.4 mg/dL — AB (ref 8.9–10.3)
CHLORIDE: 101 mmol/L (ref 101–111)
CHLORIDE: 97 mmol/L — AB (ref 101–111)
CO2: 16 mmol/L — ABNORMAL LOW (ref 22–32)
CO2: 17 mmol/L — ABNORMAL LOW (ref 22–32)
CO2: 18 mmol/L — ABNORMAL LOW (ref 22–32)
CO2: 25 mmol/L (ref 22–32)
CO2: 27 mmol/L (ref 22–32)
CO2: 27 mmol/L (ref 22–32)
CREATININE: 0.36 mg/dL — AB (ref 0.44–1.00)
CREATININE: 0.46 mg/dL (ref 0.44–1.00)
CREATININE: 0.52 mg/dL (ref 0.44–1.00)
Calcium: 8.8 mg/dL — ABNORMAL LOW (ref 8.9–10.3)
Calcium: 9 mg/dL (ref 8.9–10.3)
Calcium: 8.4 mg/dL — ABNORMAL LOW (ref 8.9–10.3)
Chloride: 106 mmol/L (ref 101–111)
Chloride: 102 mmol/L (ref 101–111)
Chloride: 101 mmol/L (ref 101–111)
Chloride: 97 mmol/L — ABNORMAL LOW (ref 101–111)
Creatinine, Ser: 0.57 mg/dL (ref 0.44–1.00)
Creatinine, Ser: 0.53 mg/dL (ref 0.44–1.00)
Creatinine, Ser: 0.49 mg/dL (ref 0.44–1.00)
GFR calc non Af Amer: >60 mL/min (ref >60)
GLUCOSE: 178 mg/dL — AB (ref 65–99)
GLUCOSE: 176 mg/dL — AB (ref 65–99)
Glucose, Bld: 381 mg/dL — ABNORMAL HIGH (ref 65–99)
Glucose, Bld: 292 mg/dL — ABNORMAL HIGH (ref 65–99)
Glucose, Bld: 355 mg/dL — ABNORMAL HIGH (ref 65–99)
Glucose, Bld: 300 mg/dL — ABNORMAL HIGH (ref 65–99)
POTASSIUM: 4 mmol/L (ref 3.5–5.1)
POTASSIUM: 3.8 mmol/L (ref 3.5–5.1)
POTASSIUM: 3.7 mmol/L (ref 3.5–5.1)
Potassium: 5.4 mmol/L — ABNORMAL HIGH (ref 3.5–5.1)
Potassium: 4.4 mmol/L (ref 3.5–5.1)
Potassium: 4.6 mmol/L (ref 3.5–5.1)
SODIUM: 132 mmol/L — AB (ref 135–145)
SODIUM: 134 mmol/L — AB (ref 135–145)
SODIUM: 138 mmol/L (ref 135–145)
SODIUM: 134 mmol/L — AB (ref 135–145)
Sodium: 135 mmol/L (ref 135–145)
Sodium: 134 mmol/L — ABNORMAL LOW (ref 135–145)

## 2015-04-13 LAB — GLUCOSE, CAPILLARY
GLUCOSE-CAPILLARY: 181 mg/dL — AB (ref 65–99)
GLUCOSE-CAPILLARY: 215 mg/dL — AB (ref 65–99)
GLUCOSE-CAPILLARY: 232 mg/dL — AB (ref 65–99)
GLUCOSE-CAPILLARY: 273 mg/dL — AB (ref 65–99)
Glucose-Capillary: 303 mg/dL — ABNORMAL HIGH (ref 65–99)
Glucose-Capillary: 327 mg/dL — ABNORMAL HIGH (ref 65–99)
Glucose-Capillary: 158 mg/dL — ABNORMAL HIGH (ref 65–99)

## 2015-04-13 MED ORDER — STERILE WATER FOR INJECTION IV SOLN
INTRAVENOUS | Status: DC
  Administered 2015-04-13 – 2015-04-14 (×2): via INTRAVENOUS
  Filled 2015-04-13 (×2): qty 850

## 2015-04-13 MED ORDER — DEXTROSE-NACL 5-0.45 % IV SOLN
INTRAVENOUS | Status: DC
  Administered 2015-04-13: 04:00:00 via INTRAVENOUS

## 2015-04-13 MED ORDER — ONDANSETRON HCL 4 MG/2ML IJ SOLN
4.0000 mg | Freq: Four times a day (QID) | INTRAMUSCULAR | Status: DC
  Administered 2015-04-13 – 2015-04-15 (×9): 4 mg via INTRAVENOUS
  Filled 2015-04-13 (×9): qty 2

## 2015-04-13 MED ORDER — SODIUM BICARBONATE 650 MG PO TABS: 650.0000 mg | ORAL_TABLET | Freq: Two times a day (BID) | ORAL | Status: DC

## 2015-04-13 MED ORDER — SODIUM BICARBONATE 8.4 % IV SOLN
INTRAVENOUS | Status: DC
  Filled 2015-04-13: qty 150

## 2015-04-13 NOTE — Progress Notes (Signed)
[facility]                                                                              Patient Demographics  [name], is a 25 y.o. female, DOB - 1990-06-06, UA:[id number]  Admit date - 04/08/2015   Admitting Physician Reubin Milan, MD  Outpatient Primary MD for the patient is ANDERSON,TERESA, [place]  LOS - 5   Chief Complaint  Patient presents with  . Emesis  . Diarrhea      HPI on 04/08/2015 by Dr. Shanon Brow [name] is a 25 y.o. female with a past medical history of type 1 diabetes, asthma, hypertension, heart murmur who comes emergency department due to nausea, vomiting and diarrhea since this morning. Per mother, the patient started having nausea and emesis at home. Which was followed by diarrhea. Apparently she has not had any fevers and there are no known sick contacts. The patient states that she feels bodyaches and discomfort like she is having a DKA. Workup in the ER shows an elevated blood glucose, increased anion gap and ketonuria.  Interim history DKA resolved, anion gap closed. Patient continues to have nausea and vomiting and requiring antiemetics. Patient is not eating.   Assessment & Plan   Diabetic ketoacidosis in a patient with uncontrolled diabetes mellitus, type I -DKA has resolved, anion gap normal -Insulin drip was discontinued yseterday, however, patient developed more nausea and vomiting this morning -May need to restart insulin drip, will continue to monitor BMP -Continue lantus with ISS for now. -Hemoglobin A1c pending -Diabetes coordinator consulted  Nausea/vomiting/Diabetic gastroparesis -Continue antiemetics, Reglan -Abdominal x-ray unremarkable -scheduled zofran and reglan along with PRN Compazine, Phenergan, Zofran  Asthma -Stable  Sinus tachycardia -Likely secondary to continued nausea, vomiting and pain -Continue IV metoprolol  Chronic diastolic heart failure -BNP 43 -Echocardiogram 05/24/2014  showed EF Q000111Q, grade 1 diastolic dysfunction -Currently euvolemic -monitor I/Os, daily weights  Hematemesis/upper GI bleed -Hemoglobin appears to be stable, likely secondary to forceful vomiting -Continue IV Protonix -Continue to monitor CBC  Situational hypertension -Likely secondary to nausea and vomiting -Continue IV hydralazine as needed  Code Status: Full  Family Communication: None at bedside  Disposition Plan: admitted. Continue to monitor in step down.  Time Spent in minutes   30 minutes  Procedures  None  Consults   Diabetes coordinator  DVT Prophylaxis  SCDs  Lab Results  Component Value Date   PLT 270 04/13/2015    Medications  Scheduled Meds: . insulin aspart  0-5 Units Subcutaneous QHS  . insulin aspart  0-9 Units Subcutaneous TID WC  . insulin glargine  24 Units Subcutaneous QHS  . metoCLOPramide (REGLAN) injection  10 mg Intravenous 4 times per day  . metoprolol  5 mg Intravenous 4 times per day  . ondansetron (ZOFRAN) IV  4 mg Intravenous 4 times per day  . pantoprazole (PROTONIX) IV  40 mg Intravenous Q12H   Continuous Infusions: . insulin (NOVOLIN-R) infusion Stopped (04/12/15 2310)  .  sodium bicarbonate 150 mEq in sterile water 1000 mL infusion 75 mL/hr at 04/13/15 1010   PRN Meds:.hydrALAZINE, morphine injection, ondansetron **OR** ondansetron (ZOFRAN) IV, prochlorperazine, promethazine  Antibiotics  Anti-infectives    None     Subjective:   [name] seen and examined today. Patient continues to complain of nausea and vomiting.  Denies chest pain, shortness of breath, dizziness.   Objective:   Filed Vitals:   04/13/15 0200 04/13/15 0400 04/13/15 0600 04/13/15 0800  BP: 123/68  136/91 176/126  Pulse: 109  110 146  Temp:  98.6 F (37 C)  98.4 F (36.9 C)  TempSrc:  Oral  Oral  Resp: 19  21 22   Height:      Weight:      SpO2: 96%  98% 99%    Wt Readings from Last 3 Encounters:  04/12/15 54.8 kg (120 lb 13  oz)  03/22/15 52.9 kg (116 lb 10 oz)  02/11/15 53.978 kg (119 lb)     Intake/Output Summary (Last 24 hours) at 04/13/15 1148 Last data filed at 04/13/15 1010  Gross per 24 hour  Intake 2096.91 ml  Output   2950 ml  Net -853.09 ml    Exam  General: Well developed, well nourished, mild distress  HEENT: NCAT,mucous membranes moist.   Cardiovascular: S1 S2 auscultated, tachycardic  Respiratory: Clear to auscultation bilaterally  Abdomen: Soft, generalized TTP, nondistended, + bowel sounds  Extremities: warm dry without cyanosis clubbing or edema  Neuro: AAOx3, nonfocal  Psych: Appropriate  Data Review   Micro Results Recent Results (from the past 240 hour(s))  MRSA PCR Screening     Status: None   Collection Time: 04/09/15  3:10 PM  Result Value Ref Range Status   MRSA by PCR NEGATIVE NEGATIVE Final    Comment:        The GeneXpert MRSA Assay (FDA approved for NASAL specimens only), is one component of a comprehensive MRSA colonization surveillance program. It is not intended to diagnose MRSA infection nor to guide or monitor treatment for MRSA infections.     Radiology Reports Dg Abd 2 Views  04/11/2015  CLINICAL DATA:  Intractable nausea and vomiting, abdominal pain EXAM: ABDOMEN - 2 VIEW COMPARISON:  None. FINDINGS: The bowel gas pattern is normal. There is no evidence of free air. No radio-opaque calculi or other significant radiographic abnormality is seen. IMPRESSION: Negative. Electronically Signed   By: Kathreen Devoid   On: 04/11/2015 13:51   Dg Abd Acute W/chest  03/22/2015  CLINICAL DATA:  Fever, nausea, vomiting and diarrhea for 2 days. Status post cholecystectomy 2 months ago. History of pancreatitis, liver mass, diabetes. EXAM: DG ABDOMEN ACUTE W/ 1V CHEST COMPARISON:  Chest radiograph November 24, 2014 FINDINGS: Cardiomediastinal silhouette is normal. Lungs are clear, no pleural effusions. No pneumothorax. Soft tissue planes and included osseous  structures are unremarkable. Paucity of small bowel gas. Scattered nondistended gas in the large bowel. Mild amount of retained large bowel stool. Surgical clips in the included right abdomen compatible with cholecystectomy. No intra-abdominal mass effect, pathologic calcifications or free air. Phleboliths project in the pelvis. Soft tissue planes and included osseous structures are non-suspicious. IMPRESSION: Normal chest. Nonspecific bowel gas pattern. Electronically Signed   By: Elon Alas M.D.   On: 03/22/2015 02:07    CBC  Recent Labs Lab 04/08/15 1938 04/09/15 0415 04/10/15 0550 04/11/15 0325 04/13/15 0200  WBC 7.5 9.3 6.3 7.7 7.8  HGB 13.7 12.1 13.1 12.1 14.4  HCT 41.3 36.2 38.3 35.0* 41.8  PLT 380 357 280 322 270  MCV 93.4 93.5 92.1 88.6 89.3  MCH 31.0 31.3 31.5 30.6 30.8  MCHC 33.2 33.4 34.2 34.6  34.4  RDW 13.2 13.5 13.2 13.0 13.2  LYMPHSABS  --  1.8  --   --   --   MONOABS  --  0.5  --   --   --   EOSABS  --  0.0  --   --   --   BASOSABS  --  0.0  --   --   --     Chemistries   Recent Labs Lab 04/08/15 1938  04/09/15 0415  04/12/15 1848 04/12/15 2146 04/13/15 0200 04/13/15 0542 04/13/15 0930  NA 135  < > 139  < > 137 135 132* 135 134*  K 4.5  < > 3.8  < > 5.6* 4.0 5.4* 4.4 4.6  CL 102  < > 113*  < > 107 105 106 102 101  CO2 16*  < > 17*  < > 18* 21* 16* 17* 18*  GLUCOSE 394*  < > 165*  < > 152* 206* 381* 292* 355*  BUN 14  < > 11  < > <5* <5* <5* <5* <5*  CREATININE 0.52  < > 0.45  < > 0.39* 0.50 0.36* 0.46 0.52  CALCIUM 9.2  < > 8.0*  < > 9.0 8.4* 8.7* 9.0 8.8*  MG 1.7  --   --   --   --   --   --   --   --   AST 19  --  15  --   --   --   --   --   --   ALT 14  --  11*  --   --   --   --   --   --   ALKPHOS 99  --  83  --   --   --   --   --   --   BILITOT 1.5*  --  0.9  --   --   --   --   --   --   < > = values in this interval not  displayed. ------------------------------------------------------------------------------------------------------------------ estimated creatinine clearance is 92.8 mL/min (by C-G formula based on Cr of 0.52). ------------------------------------------------------------------------------------------------------------------ No results for input(s): HGBA1C in the last 72 hours. ------------------------------------------------------------------------------------------------------------------ No results for input(s): CHOL, HDL, LDLCALC, TRIG, CHOLHDL, LDLDIRECT in the last 72 hours. ------------------------------------------------------------------------------------------------------------------ No results for input(s): TSH, T4TOTAL, T3FREE, THYROIDAB in the last 72 hours.  Invalid input(s): FREET3 ------------------------------------------------------------------------------------------------------------------ No results for input(s): VITAMINB12, FOLATE, FERRITIN, TIBC, IRON, RETICCTPCT in the last 72 hours.  Coagulation profile No results for input(s): INR, PROTIME in the last 168 hours.  No results for input(s): DDIMER in the last 72 hours.  Cardiac Enzymes No results for input(s): CKMB, TROPONINI, MYOGLOBIN in the last 168 hours.  Invalid input(s): CK ------------------------------------------------------------------------------------------------------------------ Invalid input(s): POCBNP    [name] D.O. on 04/13/2015 at 11:48 AM  Between 7am to 7pm - Pager - [phone]  After 7pm go to [email or web address] - password [email or web address]  And look for the night coverage person covering for me after hours  [facility] Office  [phone]

## 2015-04-14 DIAGNOSIS — E876 Hypokalemia: Secondary | ICD-10-CM

## 2015-04-14 LAB — BASIC METABOLIC PANEL
ANION GAP: 14 (ref 5–15)
CHLORIDE: 98 mmol/L — AB (ref 101–111)
CO2: 24 mmol/L (ref 22–32)
Calcium: 8.9 mg/dL (ref 8.9–10.3)
Creatinine, Ser: 0.57 mg/dL (ref 0.44–1.00)
GFR calc Af Amer: >60 mL/min (ref >60)
GLUCOSE: 248 mg/dL — AB (ref 65–99)
POTASSIUM: 3.3 mmol/L — AB (ref 3.5–5.1)
Sodium: 136 mmol/L (ref 135–145)

## 2015-04-14 LAB — CBC
HEMATOCRIT: 35.2 % — AB (ref 36.0–46.0)
HEMOGLOBIN: 12.1 g/dL (ref 12.0–15.0)
MCH: 31.1 pg (ref 26.0–34.0)
MCHC: 34.4 g/dL (ref 30.0–36.0)
MCV: 90.5 fL (ref 78.0–100.0)
Platelets: 336 10*3/uL (ref 150–400)
RBC: 3.89 MIL/uL (ref 3.87–5.11)
RDW: 13 % (ref 11.5–15.5)
WBC: 5.6 10*3/uL (ref 4.0–10.5)

## 2015-04-14 LAB — GLUCOSE, CAPILLARY
GLUCOSE-CAPILLARY: 106 mg/dL — AB (ref 65–99)
GLUCOSE-CAPILLARY: 291 mg/dL — AB (ref 65–99)
Glucose-Capillary: 214 mg/dL — ABNORMAL HIGH (ref 65–99)
Glucose-Capillary: 333 mg/dL — ABNORMAL HIGH (ref 65–99)

## 2015-04-14 MED ORDER — POTASSIUM CHLORIDE CRYS ER 20 MEQ PO TBCR
40.0000 meq | EXTENDED_RELEASE_TABLET | Freq: Once | ORAL | Status: AC
  Administered 2015-04-14: 40 meq via ORAL
  Filled 2015-04-14: qty 2

## 2015-04-14 NOTE — Progress Notes (Signed)
[facility]                                                                              Patient Demographics  [name], is a 25 y.o. female, DOB - [birth year], UA:[id number]  Admit date - 04/08/2015   Admitting Physician Reubin Milan, MD  Outpatient Primary MD for the patient is ANDERSON,TERESA, [place]  LOS - 6   Chief Complaint  Patient presents with  . Emesis  . Diarrhea      HPI on 04/08/2015 by Dr. Shanon Brow Manual Kateisha Nowden is a 25 y.o. female with a past medical history of type 1 diabetes, asthma, hypertension, heart murmur who comes emergency department due to nausea, vomiting and diarrhea since this morning. Per mother, the patient started having nausea and emesis at home. Which was followed by diarrhea. Apparently she has not had any fevers and there are no known sick contacts. The patient states that she feels bodyaches and discomfort like she is having a DKA. Workup in the ER shows an elevated blood glucose, increased anion gap and ketonuria.  Interim history DKA resolved, anion gap closed. Patient continues to have nausea and vomiting and requiring antiemetics. Patient is not eating.   Assessment & Plan   Diabetic ketoacidosis in a patient with uncontrolled diabetes mellitus, type I -DKA has resolved, anion gap normal -Insulin drip was discontinued and patient transitioned to lantus and ISS -Hemoglobin A1c pending -Diabetes coordinator consulted  Nausea/vomiting/Diabetic gastroparesis -Continue antiemetics, Reglan -Abdominal x-ray unremarkable -scheduled zofran and reglan along with PRN Compazine, Phenergan, Zofran -Appears to be improving -Patient placed of soft diet yesterday and was able to tolerate -Will advance diet to carb modified  Asthma -Stable  Sinus tachycardia -Likely secondary to continued nausea, vomiting and pain -Continue IV metoprolol  Chronic diastolic heart failure -BNP 43 -Echocardiogram 05/24/2014 showed  EF Q000111Q, grade 1 diastolic dysfunction -Currently euvolemic -monitor I/Os, daily weights  Hematemesis/upper GI bleed -Hemoglobin appears to be stable, likely secondary to forceful vomiting -Continue IV Protonix -Continue to monitor CBC  Situational hypertension -Likely secondary to nausea and vomiting -improved -Continue IV hydralazine as needed  Hypokalemia -Will replace and continue to monitor BMP  Code Status: Full  Family Communication: None at bedside  Disposition Plan: admitted. Patient improving. Will transfer to out of step down. Monitor for additional 24 hours.  Time Spent in minutes   30 minutes  Procedures  None  Consults   Diabetes coordinator  DVT Prophylaxis  SCDs  Lab Results  Component Value Date   PLT 336 04/14/2015    Medications  Scheduled Meds: . insulin aspart  0-5 Units Subcutaneous QHS  . insulin aspart  0-9 Units Subcutaneous TID WC  . insulin glargine  24 Units Subcutaneous QHS  . metoCLOPramide (REGLAN) injection  10 mg Intravenous 4 times per day  . metoprolol  5 mg Intravenous 4 times per day  . ondansetron (ZOFRAN) IV  4 mg Intravenous 4 times per day  . pantoprazole (PROTONIX) IV  40 mg Intravenous Q12H   Continuous Infusions: . insulin (NOVOLIN-R) infusion Stopped (04/12/15 2310)  .  sodium bicarbonate 150 mEq in sterile water 1000 mL infusion 75 mL/hr at  04/14/15 0103   PRN Meds:.hydrALAZINE, morphine injection, ondansetron **OR** ondansetron (ZOFRAN) IV, prochlorperazine, promethazine  Antibiotics    Anti-infectives    None     Subjective:   Jisela Winton seen and examined today. Patient states her nausea and vomiting have improved.  Would like to eat.  Denies chest pain, shortness of breath, dizziness.   Objective:   Filed Vitals:   04/14/15 0400 04/14/15 0500 04/14/15 0800 04/14/15 1027  BP: 122/80   123/84  Pulse: 96   104  Temp: 98.3 F (36.8 C)  98.2 F (36.8 C) 98.4 F (36.9 C)  TempSrc: Oral  Oral  Oral  Resp: 15   16  Height:    5\' 4"  (1.626 m)  Weight:  50.2 kg (110 lb 10.7 oz)  51.619 kg (113 lb 12.8 oz)  SpO2: 99%   100%    Wt Readings from Last 3 Encounters:  04/14/15 51.619 kg (113 lb 12.8 oz)  03/22/15 52.9 kg (116 lb 10 oz)  02/11/15 53.978 kg (119 lb)     Intake/Output Summary (Last 24 hours) at 04/14/15 1041 Last data filed at 04/14/15 0500  Gross per 24 hour  Intake 1412.5 ml  Output    600 ml  Net  812.5 ml    Exam  General: Well developed, well nourished, NAD  HEENT: NCAT,mucous membranes moist.   Cardiovascular: S1 S2 auscultated, tachycardic  Respiratory: Clear to auscultation bilaterally  Abdomen: Soft, nontender, nondistended, + bowel sounds  Extremities: warm dry without cyanosis clubbing or edema  Neuro: AAOx3, nonfocal  Psych: Appropriate mood and affet  Data Review   Micro Results Recent Results (from the past 240 hour(s))  MRSA PCR Screening     Status: None   Collection Time: 04/09/15  3:10 PM  Result Value Ref Range Status   MRSA by PCR NEGATIVE NEGATIVE Final    Comment:        The GeneXpert MRSA Assay (FDA approved for NASAL specimens only), is one component of a comprehensive MRSA colonization surveillance program. It is not intended to diagnose MRSA infection nor to guide or monitor treatment for MRSA infections.     Radiology Reports Dg Abd 2 Views  04/11/2015  CLINICAL DATA:  Intractable nausea and vomiting, abdominal pain EXAM: ABDOMEN - 2 VIEW COMPARISON:  None. FINDINGS: The bowel gas pattern is normal. There is no evidence of free air. No radio-opaque calculi or other significant radiographic abnormality is seen. IMPRESSION: Negative. Electronically Signed   By: Kathreen Devoid   On: 04/11/2015 13:51   Dg Abd Acute W/chest  03/22/2015  CLINICAL DATA:  Fever, nausea, vomiting and diarrhea for 2 days. Status post cholecystectomy 2 months ago. History of pancreatitis, liver mass, diabetes. EXAM: DG ABDOMEN ACUTE W/ 1V  CHEST COMPARISON:  Chest radiograph November 24, 2014 FINDINGS: Cardiomediastinal silhouette is normal. Lungs are clear, no pleural effusions. No pneumothorax. Soft tissue planes and included osseous structures are unremarkable. Paucity of small bowel gas. Scattered nondistended gas in the large bowel. Mild amount of retained large bowel stool. Surgical clips in the included right abdomen compatible with cholecystectomy. No intra-abdominal mass effect, pathologic calcifications or free air. Phleboliths project in the pelvis. Soft tissue planes and included osseous structures are non-suspicious. IMPRESSION: Normal chest. Nonspecific bowel gas pattern. Electronically Signed   By: Elon Alas M.D.   On: 03/22/2015 02:07    CBC  Recent Labs Lab 04/09/15 0415 04/10/15 0550 04/11/15 0325 04/13/15 0200 04/14/15 0146  WBC 9.3 6.3  7.7 7.8 5.6  HGB 12.1 13.1 12.1 14.4 12.1  HCT 36.2 38.3 35.0* 41.8 35.2*  PLT 357 280 322 270 336  MCV 93.5 92.1 88.6 89.3 90.5  MCH 31.3 31.5 30.6 30.8 31.1  MCHC 33.4 34.2 34.6 34.4 34.4  RDW 13.5 13.2 13.0 13.2 13.0  LYMPHSABS 1.8  --   --   --   --   MONOABS 0.5  --   --   --   --   EOSABS 0.0  --   --   --   --   BASOSABS 0.0  --   --   --   --     Chemistries   Recent Labs Lab 04/08/15 1938  04/09/15 0415  04/13/15 0930 04/13/15 1341 04/13/15 1814 04/13/15 2146 04/14/15 0138  NA 135  < > 139  < > 134* 138 134* 134* 136  K 4.5  < > 3.8  < > 4.6 4.0 3.8 3.7 3.3*  CL 102  < > 113*  < > 101 101 97* 97* 98*  CO2 16*  < > 17*  < > 18* 25 27 27 24   GLUCOSE 394*  < > 165*  < > 355* 178* 176* 300* 248*  BUN 14  < > 11  < > <5* <5* <5* <5* <5*  CREATININE 0.52  < > 0.45  < > 0.52 0.57 0.53 0.49 0.57  CALCIUM 9.2  < > 8.0*  < > 8.8* 9.0 8.4* 8.4* 8.9  MG 1.7  --   --   --   --   --   --   --   --   AST 19  --  15  --   --   --   --   --   --   ALT 14  --  11*  --   --   --   --   --   --   ALKPHOS 99  --  83  --   --   --   --   --   --   BILITOT  1.5*  --  0.9  --   --   --   --   --   --   < > = values in this interval not displayed. ------------------------------------------------------------------------------------------------------------------ estimated creatinine clearance is 87.6 mL/min (by C-G formula based on Cr of 0.57). ------------------------------------------------------------------------------------------------------------------ No results for input(s): HGBA1C in the last 72 hours. ------------------------------------------------------------------------------------------------------------------ No results for input(s): CHOL, HDL, LDLCALC, TRIG, CHOLHDL, LDLDIRECT in the last 72 hours. ------------------------------------------------------------------------------------------------------------------ No results for input(s): TSH, T4TOTAL, T3FREE, THYROIDAB in the last 72 hours.  Invalid input(s): FREET3 ------------------------------------------------------------------------------------------------------------------ No results for input(s): VITAMINB12, FOLATE, FERRITIN, TIBC, IRON, RETICCTPCT in the last 72 hours.  Coagulation profile No results for input(s): INR, PROTIME in the last 168 hours.  No results for input(s): DDIMER in the last 72 hours.  Cardiac Enzymes No results for input(s): CKMB, TROPONINI, MYOGLOBIN in the last 168 hours.  Invalid input(s): CK ------------------------------------------------------------------------------------------------------------------ Invalid input(s): POCBNP    Galadriel Shroff D.O. on 04/14/2015 at 10:41 AM  Between 7am to 7pm - Pager - [phone]  After 7pm go to [email or web address] - password [email or web address]  And look for the night coverage person covering for me after hours  [facility] Office  [phone]

## 2015-04-15 LAB — CBC
HCT: 33.2 % — ABNORMAL LOW (ref 36.0–46.0)
Hemoglobin: 11.5 g/dL — ABNORMAL LOW (ref 12.0–15.0)
MCH: 31.6 pg (ref 26.0–34.0)
MCHC: 34.6 g/dL (ref 30.0–36.0)
MCV: 91.2 fL (ref 78.0–100.0)
PLATELETS: 329 10*3/uL (ref 150–400)
RBC: 3.64 MIL/uL — ABNORMAL LOW (ref 3.87–5.11)
RDW: 12.9 % (ref 11.5–15.5)
WBC: 5.8 10*3/uL (ref 4.0–10.5)

## 2015-04-15 LAB — BASIC METABOLIC PANEL
Anion gap: 9 (ref 5–15)
BUN: 10 mg/dL (ref 6–20)
CALCIUM: 8.7 mg/dL — AB (ref 8.9–10.3)
CO2: 28 mmol/L (ref 22–32)
CREATININE: 0.54 mg/dL (ref 0.44–1.00)
Chloride: 99 mmol/L — ABNORMAL LOW (ref 101–111)
Glucose, Bld: 162 mg/dL — ABNORMAL HIGH (ref 65–99)
Potassium: 3.5 mmol/L (ref 3.5–5.1)
SODIUM: 136 mmol/L (ref 135–145)

## 2015-04-15 LAB — HEMOGLOBIN A1C
HEMOGLOBIN A1C: 9.3 % — AB (ref 4.8–5.6)
MEAN PLASMA GLUCOSE: 220 mg/dL

## 2015-04-15 LAB — MAGNESIUM: MAGNESIUM: 1.6 mg/dL — AB (ref 1.7–2.4)

## 2015-04-15 LAB — GLUCOSE, CAPILLARY
GLUCOSE-CAPILLARY: 166 mg/dL — AB (ref 65–99)
Glucose-Capillary: 231 mg/dL — ABNORMAL HIGH (ref 65–99)

## 2015-04-15 MED ORDER — METOCLOPRAMIDE HCL 10 MG PO TABS: 10.0000 mg | ORAL_TABLET | Freq: Four times a day (QID) | ORAL | Status: DC

## 2015-04-15 MED ORDER — ONDANSETRON HCL 4 MG PO TABS: 4.0000 mg | ORAL_TABLET | Freq: Four times a day (QID) | ORAL | Status: DC | PRN

## 2015-04-15 NOTE — Progress Notes (Addendum)
NUTRITION NOTE  Per rounds this AM, pt with poorly controlled Type 1 DM and would benefit from diet education. DM Coordinator saw pt today with note written at 2 and DM Coordinator ordered outpatient diet education for pt.  No inpatient DM education will be done by this RD at this time. Pt to d/c today with order and summary already written.    Jarome Matin, RD, LDN Inpatient Clinical Dietitian Pager # [phone] After hours/weekend pager # [phone]

## 2015-04-15 NOTE — Discharge Summary (Signed)
Physician Discharge Summary  [name] [id number] DOB: February 28, 1990 DOA: 04/08/2015  PCP: Stefanie Aly, FNP  Admit date: 04/08/2015 Discharge date: 04/15/2015  Time spent: 45 minutes  Recommendations for Outpatient Follow-up:  Patient will be discharged to home.  Patient will need to follow up with primary care provider within one week of discharge.  Follow up with endocrinology within one week.  Patient should continue medications as prescribed.  Patient should follow a heart healthy/carb modified diet.   Discharge Diagnoses:  Principal Problem:   DKA, type 1 ([place]) Active Problems:   Uncontrolled type 1 diabetes mellitus (HCC)   Asthma   Sinus tachycardia (HCC)   Nausea and vomiting   Hematemesis   Chronic diastolic heart failure ([place])   Discharge Condition: Stable  Diet recommendation: heart healthy/carb modified  Filed Weights   04/14/15 0500 04/14/15 1027 04/15/15 0509  Weight: 50.2 kg (110 lb 10.7 oz) 51.619 kg (113 lb 12.8 oz) 51.574 kg (113 lb 11.2 oz)    History of present illness:  on 04/08/2015 by [name] is a 25 y.o. female with a past medical history of type 1 diabetes, asthma, hypertension, heart murmur who comes emergency department due to nausea, vomiting and diarrhea since this morning. Per mother, the patient started having nausea and emesis at home. Which was followed by diarrhea. Apparently she has not had any fevers and there are no known sick contacts. The patient states that she feels bodyaches and discomfort like she is having a DKA. Workup in the ER shows an elevated blood glucose, increased anion gap and ketonuria.  Hospital Course:  Diabetic ketoacidosis in a patient with uncontrolled diabetes mellitus, type I -DKA has resolved, anion gap normal -Insulin drip was discontinued and patient transitioned to lantus and ISS -Hemoglobin A1c 9.3 -Diabetes coordinator consulted  Nausea/vomiting/Diabetic  gastroparesis -Resolved -Continue antiemetics, Reglan -Abdominal x-ray unremarkable -scheduled zofran and reglan along with PRN Compazine, Phenergan, Zofran -Able to tolerate carb modified diet  Asthma -Stable  Sinus tachycardia -Likely secondary to continued nausea, vomiting and pain  Chronic diastolic heart failure -BNP 43 -Echocardiogram 05/24/2014 showed EF [id number], grade 1 diastolic dysfunction -Currently euvolemic  Hematemesis/upper GI bleed -Hemoglobin appears to be stable, likely secondary to forceful vomiting  Situational hypertension -Likely secondary to nausea and vomiting -Resolved  Hypokalemia -Resolved.  -Repeat BMP in one week  Procedures  None  Consults  Diabetes coordinator  Discharge Exam: Filed Vitals:   04/14/15 2315 04/15/15 0509  BP: 108/68 104/71  Pulse: 97 92  Temp:  98.3 F (36.8 C)  Resp:  18    Exam  General: Well developed, well nourished, NAD  HEENT: NCAT,mucous membranes moist.   Cardiovascular: S1 S2 auscultated, tachycardic  Respiratory: Clear to auscultation bilaterally  Abdomen: Soft, nontender, nondistended, + bowel sounds  Extremities: warm dry without cyanosis clubbing or edema  Neuro: AAOx3, nonfocal  Psych: Appropriate mood and affet  Discharge Instructions      Discharge Instructions    Discharge instructions    Complete by:  As directed   Patient will be discharged to home.  Patient will need to follow up with primary care provider within one week of discharge.  Follow up with endocrinology within one week.  Patient should continue medications as prescribed.  Patient should follow a heart healthy/carb modified diet.            Medication List    TAKE these medications        albuterol 108 (90 Base)  MCG/ACT inhaler  Commonly known as:  PROVENTIL HFA;VENTOLIN HFA  Inhale 2 puffs into the lungs every 6 (six) hours as needed for shortness of breath.     albuterol (2.5 MG/3ML) 0.083% nebulizer  solution  Commonly known as:  PROVENTIL  Take 2.5 mg by nebulization every 6 (six) hours as needed for shortness of breath.     budesonide-formoterol 160-4.5 MCG/ACT inhaler  Commonly known as:  SYMBICORT  Inhale 2 puffs into the lungs 2 (two) times daily as needed (SOB, wheezing).     EPIPEN 0.3 mg/0.3 mL Soaj injection  Generic drug:  EPINEPHrine  Inject 0.3 mg into the muscle once.     insulin aspart 100 UNIT/ML FlexPen  Commonly known as:  NOVOLOG FLEXPEN  INJECT 0-20 UNITS INTO THE SKIN AS DIRECTED. SSI - CARB SCALE, 0-20 UNITS     Insulin Glargine 100 UNIT/ML Solostar Pen  Commonly known as:  LANTUS SOLOSTAR  Inject 36 Units into the skin daily at 10 pm.     metoCLOPramide 10 MG tablet  Commonly known as:  REGLAN  Take 1 tablet (10 mg total) by mouth 4 (four) times daily.     montelukast 10 MG tablet  Commonly known as:  SINGULAIR  Take 10 mg by mouth daily.     ondansetron 4 MG tablet  Commonly known as:  ZOFRAN  Take 1 tablet (4 mg total) by mouth every 6 (six) hours as needed for nausea.       Allergies  Allergen Reactions  . Peanut-Containing Drug Products Swelling    Swelling of mouth, lips  . Strawberry Extract Swelling    Swelling of mouth, lips   Follow-up Information    Follow up with Stefanie Aly, FNP. Go on 04/16/2015.   Specialty:  Nurse Practitioner   Why:  You have a hospital follow up appointment on 04/16/15 at 3 pm with [name]    Contact information:   [address] [place] [address] [phone]        The results of significant diagnostics from this hospitalization (including imaging, microbiology, ancillary and laboratory) are listed below for reference.    Significant Diagnostic Studies: Dg Abd 2 Views  04/11/2015  CLINICAL DATA:  Intractable nausea and vomiting, abdominal pain EXAM: ABDOMEN - 2 VIEW COMPARISON:  None. FINDINGS: The bowel gas pattern is normal. There is no evidence of free air. No  radio-opaque calculi or other significant radiographic abnormality is seen. IMPRESSION: Negative. Electronically Signed   By: Kathreen Devoid   On: 04/11/2015 13:51   Dg Abd Acute W/chest  03/22/2015  CLINICAL DATA:  Fever, nausea, vomiting and diarrhea for 2 days. Status post cholecystectomy 2 months ago. History of pancreatitis, liver mass, diabetes. EXAM: DG ABDOMEN ACUTE W/ 1V CHEST COMPARISON:  Chest radiograph November 24, 2014 FINDINGS: Cardiomediastinal silhouette is normal. Lungs are clear, no pleural effusions. No pneumothorax. Soft tissue planes and included osseous structures are unremarkable. Paucity of small bowel gas. Scattered nondistended gas in the large bowel. Mild amount of retained large bowel stool. Surgical clips in the included right abdomen compatible with cholecystectomy. No intra-abdominal mass effect, pathologic calcifications or free air. Phleboliths project in the pelvis. Soft tissue planes and included osseous structures are non-suspicious. IMPRESSION: Normal chest. Nonspecific bowel gas pattern. Electronically Signed   By: Elon Alas M.D.   On: 03/22/2015 02:07    Microbiology: Recent Results (from the past 240 hour(s))  MRSA PCR Screening     Status: None  Collection Time: 04/09/15  3:10 PM  Result Value Ref Range Status   MRSA by PCR NEGATIVE NEGATIVE Final    Comment:        The GeneXpert MRSA Assay (FDA approved for NASAL specimens only), is one component of a comprehensive MRSA colonization surveillance program. It is not intended to diagnose MRSA infection nor to guide or monitor treatment for MRSA infections.      Labs: Basic Metabolic Panel:  Recent Labs Lab 04/08/15 1938 04/09/15 0120  04/13/15 1341 04/13/15 1814 04/13/15 2146 04/14/15 0138 04/15/15 0455  NA 135 140  < > 138 134* 134* 136 136  K 4.5 4.3  < > 4.0 3.8 3.7 3.3* 3.5  CL 102 111  < > 101 97* 97* 98* 99*  CO2 16* 15*  < > 25 27 27 24 28   GLUCOSE 394* 235*  < > 178* 176*  300* 248* 162*  BUN 14 13  < > <5* <5* <5* <5* 10  CREATININE 0.52 0.60  < > 0.57 0.53 0.49 0.57 0.54  CALCIUM 9.2 8.7*  < > 9.0 8.4* 8.4* 8.9 8.7*  MG 1.7  --   --   --   --   --   --  1.6*  PHOS 2.3* 2.3*  --   --   --   --   --   --   < > = values in this interval not displayed. Liver Function Tests:  Recent Labs Lab 04/08/15 1938 04/09/15 0415  AST 19 15  ALT 14 11*  ALKPHOS 99 83  BILITOT 1.5* 0.9  PROT 7.3 6.1*  ALBUMIN 4.0 3.4*    Recent Labs Lab 04/08/15 1938  LIPASE 18   No results for input(s): AMMONIA in the last 168 hours. CBC:  Recent Labs Lab 04/09/15 0415 04/10/15 0550 04/11/15 0325 04/13/15 0200 04/14/15 0146 04/15/15 0455  WBC 9.3 6.3 7.7 7.8 5.6 5.8  NEUTROABS 7.0  --   --   --   --   --   HGB 12.1 13.1 12.1 14.4 12.1 11.5*  HCT 36.2 38.3 35.0* 41.8 35.2* 33.2*  MCV 93.5 92.1 88.6 89.3 90.5 91.2  PLT 357 280 322 270 336 329   Cardiac Enzymes: No results for input(s): CKTOTAL, CKMB, CKMBINDEX, TROPONINI in the last 168 hours. BNP: BNP (last 3 results)  Recent Labs  04/11/15 0325  BNP 43.5    ProBNP (last 3 results) No results for input(s): PROBNP in the last 8760 hours.  CBG:  Recent Labs Lab 04/14/15 0744 04/14/15 1151 04/14/15 1625 04/14/15 2052 04/15/15 0732  GLUCAP 106* 291* 214* 333* 166*       Signed:  Cristal Ford  [facility] 04/15/2015, 10:53 AM

## 2015-04-15 NOTE — Progress Notes (Signed)
Inpatient Diabetes Program Recommendations  AACE/ADA: New Consensus Statement on Inpatient Glycemic Control (2015)  Target Ranges:  Prepandial:   less than 140 mg/dL      Peak postprandial:   less than 180 mg/dL (1-2 hours)      Critically ill patients:  140 - 180 mg/dL   Review of Glycemic Control  Results for [name], [name] (MRN [id number]) as of 04/15/2015 10:43  Ref. Range 04/14/2015 07:44 04/14/2015 11:51 04/14/2015 16:25 04/14/2015 20:52 04/15/2015 07:32  Glucose-Capillary Latest Ref Range: 65-99 mg/dL 106 (H) 291 (H) 214 (H) 333 (H) 166 (H)   Post-prandial blood sugars elevated. Needs meal coverage insulin.  Inpatient Diabetes Program Recommendations:    Consider addition of Novolog 4 units tidwc for meal coverage insulin if pt eats > 50% meal.  Will talk with pt this afternoon.  Thank you. Lorenda Peck, RD, LDN, CDE Inpatient Diabetes Coordinator [phone]

## 2015-04-15 NOTE — Progress Notes (Signed)
Inpatient Diabetes Program Recommendations  AACE/ADA: New Consensus Statement on Inpatient Glycemic Control (2015)  Target Ranges:  Prepandial:   less than 140 mg/dL      Peak postprandial:   less than 180 mg/dL (1-2 hours)      Critically ill patients:  140 - 180 mg/dL   Review of Glycemic Control  Met with pt regarding her diabetes control. Has had Type 1 DM since age 25. States "I know what to do. I'm trying to do better at controlling my blood sugar. It was < 200 mg/dL before I came in the hospital. I had to wait a long time in ED and it went sky-high. Pt states she has been under a lot of stress lately with family. Has appt with new MD - Dr. Hartford Poli, next week. Discussed HgbA1C results and importance of improving her control.  Will order OP Diabetes Education for uncontrolled blood sugars.  Novolog 0-20 units tidwc and hs Lantus 36 units QHS - on 24 units QHS  Questions answered. Thank you. Lorenda Peck, RD, LDN, CDE Inpatient Diabetes Coordinator [phone]

## 2015-04-15 NOTE — Discharge Instructions (Signed)
Diabetic Ketoacidosis °Diabetic ketoacidosis is a life-threatening complication of diabetes. If it is not treated, it can cause severe dehydration and organ damage and can lead to a coma or death. °CAUSES °This condition develops when there is not enough of the hormone insulin in the body. Insulin helps the body to break down sugar for energy. Without insulin, the body cannot break down sugar, so it breaks down fats instead. This leads to the production of acids that are called ketones. Ketones are poisonous at high levels. °This condition can be triggered by: °· Stress on the body that is brought on by an illness. °· Medicines that raise blood glucose levels. °· Not taking diabetes medicine. °SYMPTOMS °Symptoms of this condition include: °· Fatigue. °· Weight loss. °· Excessive thirst. °· Light-headedness. °· Fruity or sweet-smelling breath. °· Excessive urination. °· Vision changes. °· Confusion or irritability. °· Nausea. °· Vomiting. °· Rapid breathing. °· Abdominal pain. °· Feeling flushed. °DIAGNOSIS °This condition is diagnosed based on a medical history, a physical exam, and blood tests. You may also have a urine test that checks for ketones. °TREATMENT °This condition may be treated with: °· Fluid replacement. This may be done to correct dehydration. °· Insulin injections. These may be given through the skin or through an IV tube. °· Electrolyte replacement. Electrolytes, such as potassium and sodium, may be given in pill form or through an IV tube. °· Antibiotic medicines. These may be prescribed if your condition was caused by an infection. °HOME CARE INSTRUCTIONS °Eating and Drinking °· Drink enough fluids to keep your urine clear or pale yellow. °· If you cannot eat, alternate between drinking fluids with sugar (such as juice) and salty fluids (such as broth or bouillon). °· If you can eat, follow your usual diet and drink sugar-free liquids, such as water. °Other Instructions °· Take insulin as  directed by your health care provider. Do not skip insulin injections. Do not use expired insulin. °· If your blood sugar is over 240 mg/dL, monitor your urine ketones every 4-6 hours. °· If you were prescribed an antibiotic medicine, finish all of it even if you start to feel better. °· Rest and exercise only as directed by your health care provider. °· If you get sick, call your health care provider and begin treatment quickly. Your body often needs extra insulin to fight an illness. °· Check your blood glucose levels regularly. If your blood glucose is high, drink plenty of fluids. This helps to flush out ketones. °SEEK MEDICAL CARE IF: °· Your blood glucose level is too high or too low. °· You have ketones in your urine. °· You have a fever. °· You cannot eat. °· You cannot tolerate fluids. °· You have been vomiting for more than 2 hours. °· You continue to have symptoms of this condition. °· You develop new symptoms. °SEEK IMMEDIATE MEDICAL CARE IF: °· Your blood glucose levels continue to be high (elevated). °· Your monitor reads "high" even when you are taking insulin. °· You faint. °· You have chest pain. °· You have trouble breathing. °· You have a sudden, severe headache. °· You have sudden weakness in one arm or one leg. °· You have sudden trouble speaking or swallowing. °· You have vomiting or diarrhea that gets worse after 3 hours. °· You feel severely fatigued. °· You have trouble thinking. °· You have abdominal pain. °· You are severely dehydrated. Symptoms of severe dehydration include: °¨ Extreme thirst. °¨ Dry mouth. °¨ Blue lips. °¨   Cold hands and feet. °¨ Rapid breathing. °  °This information is not intended to replace advice given to you by your health care provider. Make sure you discuss any questions you have with your health care provider. °  °Document Released: 01/03/2000 Document Revised: 05/22/2014 Document Reviewed: 12/13/2013 °Elsevier Interactive Patient Education ©2016 [employer]  [employer] ° °

## 2015-04-20 ENCOUNTER — Inpatient Hospital Stay (HOSPITAL_COMMUNITY)
Admission: EM | Admit: 2015-04-20 | Discharge: 2015-04-23 | DRG: 638 | Disposition: A | Discharge status: Home / Self Care | Payer: BLUE CROSS/BLUE SHIELD | Attending: Internal Medicine | Admitting: Internal Medicine

## 2015-04-20 DIAGNOSIS — Z7951 Long term (current) use of inhaled steroids: Secondary | ICD-10-CM

## 2015-04-20 DIAGNOSIS — E1010 Type 1 diabetes mellitus with ketoacidosis without coma: Principal | ICD-10-CM | POA: Diagnosis present

## 2015-04-20 DIAGNOSIS — E1065 Type 1 diabetes mellitus with hyperglycemia: Secondary | ICD-10-CM

## 2015-04-20 DIAGNOSIS — E1143 Type 2 diabetes mellitus with diabetic autonomic (poly)neuropathy: Secondary | ICD-10-CM | POA: Insufficient documentation

## 2015-04-20 DIAGNOSIS — J45909 Unspecified asthma, uncomplicated: Secondary | ICD-10-CM | POA: Diagnosis present

## 2015-04-20 DIAGNOSIS — D72829 Elevated white blood cell count, unspecified: Secondary | ICD-10-CM | POA: Diagnosis present

## 2015-04-20 DIAGNOSIS — Z809 Family history of malignant neoplasm, unspecified: Secondary | ICD-10-CM

## 2015-04-20 DIAGNOSIS — R112 Nausea with vomiting, unspecified: Secondary | ICD-10-CM

## 2015-04-20 DIAGNOSIS — Z79899 Other long term (current) drug therapy: Secondary | ICD-10-CM

## 2015-04-20 DIAGNOSIS — R160 Hepatomegaly, not elsewhere classified: Secondary | ICD-10-CM | POA: Diagnosis present

## 2015-04-20 DIAGNOSIS — Z91018 Allergy to other foods: Secondary | ICD-10-CM

## 2015-04-20 DIAGNOSIS — N179 Acute kidney failure, unspecified: Secondary | ICD-10-CM

## 2015-04-20 DIAGNOSIS — I10 Essential (primary) hypertension: Secondary | ICD-10-CM | POA: Diagnosis present

## 2015-04-20 DIAGNOSIS — E1110 Type 2 diabetes mellitus with ketoacidosis without coma: Secondary | ICD-10-CM | POA: Diagnosis present

## 2015-04-20 DIAGNOSIS — K3184 Gastroparesis: Secondary | ICD-10-CM | POA: Insufficient documentation

## 2015-04-20 DIAGNOSIS — E876 Hypokalemia: Secondary | ICD-10-CM

## 2015-04-20 DIAGNOSIS — K920 Hematemesis: Secondary | ICD-10-CM | POA: Diagnosis present

## 2015-04-20 DIAGNOSIS — K760 Fatty (change of) liver, not elsewhere classified: Secondary | ICD-10-CM

## 2015-04-20 DIAGNOSIS — IMO0002 Reserved for concepts with insufficient information to code with codable children: Secondary | ICD-10-CM

## 2015-04-20 DIAGNOSIS — Z91010 Allergy to peanuts: Secondary | ICD-10-CM

## 2015-04-20 DIAGNOSIS — Z9049 Acquired absence of other specified parts of digestive tract: Secondary | ICD-10-CM

## 2015-04-20 DIAGNOSIS — R109 Unspecified abdominal pain: Secondary | ICD-10-CM

## 2015-04-20 DIAGNOSIS — R000 Tachycardia, unspecified: Secondary | ICD-10-CM

## 2015-04-20 DIAGNOSIS — E860 Dehydration: Secondary | ICD-10-CM | POA: Insufficient documentation

## 2015-04-20 DIAGNOSIS — Z794 Long term (current) use of insulin: Secondary | ICD-10-CM

## 2015-04-20 DIAGNOSIS — R748 Abnormal levels of other serum enzymes: Secondary | ICD-10-CM

## 2015-04-20 DIAGNOSIS — R7989 Other specified abnormal findings of blood chemistry: Secondary | ICD-10-CM

## 2015-04-20 DIAGNOSIS — R197 Diarrhea, unspecified: Secondary | ICD-10-CM

## 2015-04-20 DIAGNOSIS — Z8249 Family history of ischemic heart disease and other diseases of the circulatory system: Secondary | ICD-10-CM

## 2015-04-20 DIAGNOSIS — E1043 Type 1 diabetes mellitus with diabetic autonomic (poly)neuropathy: Secondary | ICD-10-CM | POA: Diagnosis present

## 2015-04-20 DIAGNOSIS — I5032 Chronic diastolic (congestive) heart failure: Secondary | ICD-10-CM

## 2015-04-20 DIAGNOSIS — R945 Abnormal results of liver function studies: Secondary | ICD-10-CM

## 2015-04-20 DIAGNOSIS — Z833 Family history of diabetes mellitus: Secondary | ICD-10-CM

## 2015-04-20 DIAGNOSIS — E103219 Type 1 diabetes mellitus with mild nonproliferative diabetic retinopathy with macular edema, unspecified eye: Secondary | ICD-10-CM

## 2015-04-20 NOTE — ED Notes (Signed)
Bed: [id number] Expected date:  Expected time:  Means of arrival:  Comments: EMS 25FNV x1 week

## 2015-04-20 NOTE — ED Notes (Addendum)
Per EMS. N/V x1 week. Released Monday for same complaint. Tachy in 130s and lethargic. 4mg  Iv zofran and 433ml NS given by EMS. Dark brown/black emesis noted.

## 2015-04-21 ENCOUNTER — Encounter (HOSPITAL_COMMUNITY): Payer: Self-pay

## 2015-04-21 ENCOUNTER — Inpatient Hospital Stay (HOSPITAL_COMMUNITY): Payer: BLUE CROSS/BLUE SHIELD

## 2015-04-21 DIAGNOSIS — R000 Tachycardia, unspecified: Secondary | ICD-10-CM | POA: Diagnosis present

## 2015-04-21 DIAGNOSIS — Z9049 Acquired absence of other specified parts of digestive tract: Secondary | ICD-10-CM | POA: Diagnosis not present

## 2015-04-21 DIAGNOSIS — E1043 Type 1 diabetes mellitus with diabetic autonomic (poly)neuropathy: Secondary | ICD-10-CM | POA: Diagnosis present

## 2015-04-21 DIAGNOSIS — Z794 Long term (current) use of insulin: Secondary | ICD-10-CM | POA: Diagnosis not present

## 2015-04-21 DIAGNOSIS — Z833 Family history of diabetes mellitus: Secondary | ICD-10-CM | POA: Diagnosis not present

## 2015-04-21 DIAGNOSIS — Z91018 Allergy to other foods: Secondary | ICD-10-CM | POA: Diagnosis not present

## 2015-04-21 DIAGNOSIS — Z8659 Personal history of other mental and behavioral disorders: Secondary | ICD-10-CM | POA: Diagnosis not present

## 2015-04-21 DIAGNOSIS — Z8739 Personal history of other diseases of the musculoskeletal system and connective tissue: Secondary | ICD-10-CM | POA: Diagnosis not present

## 2015-04-21 DIAGNOSIS — G8929 Other chronic pain: Secondary | ICD-10-CM | POA: Diagnosis not present

## 2015-04-21 DIAGNOSIS — Z7951 Long term (current) use of inhaled steroids: Secondary | ICD-10-CM | POA: Diagnosis not present

## 2015-04-21 DIAGNOSIS — K760 Fatty (change of) liver, not elsewhere classified: Secondary | ICD-10-CM | POA: Diagnosis present

## 2015-04-21 DIAGNOSIS — D72829 Elevated white blood cell count, unspecified: Secondary | ICD-10-CM | POA: Diagnosis present

## 2015-04-21 DIAGNOSIS — J45909 Unspecified asthma, uncomplicated: Secondary | ICD-10-CM | POA: Diagnosis not present

## 2015-04-21 DIAGNOSIS — Z809 Family history of malignant neoplasm, unspecified: Secondary | ICD-10-CM | POA: Diagnosis not present

## 2015-04-21 DIAGNOSIS — R112 Nausea with vomiting, unspecified: Secondary | ICD-10-CM | POA: Diagnosis not present

## 2015-04-21 DIAGNOSIS — Z8249 Family history of ischemic heart disease and other diseases of the circulatory system: Secondary | ICD-10-CM | POA: Diagnosis not present

## 2015-04-21 DIAGNOSIS — Z8719 Personal history of other diseases of the digestive system: Secondary | ICD-10-CM | POA: Diagnosis not present

## 2015-04-21 DIAGNOSIS — R109 Unspecified abdominal pain: Secondary | ICD-10-CM | POA: Diagnosis present

## 2015-04-21 DIAGNOSIS — Z91010 Allergy to peanuts: Secondary | ICD-10-CM | POA: Diagnosis not present

## 2015-04-21 DIAGNOSIS — K3184 Gastroparesis: Secondary | ICD-10-CM | POA: Diagnosis not present

## 2015-04-21 DIAGNOSIS — E109 Type 1 diabetes mellitus without complications: Secondary | ICD-10-CM | POA: Diagnosis not present

## 2015-04-21 DIAGNOSIS — E860 Dehydration: Secondary | ICD-10-CM | POA: Diagnosis not present

## 2015-04-21 DIAGNOSIS — R160 Hepatomegaly, not elsewhere classified: Secondary | ICD-10-CM | POA: Diagnosis present

## 2015-04-21 DIAGNOSIS — R011 Cardiac murmur, unspecified: Secondary | ICD-10-CM | POA: Diagnosis not present

## 2015-04-21 DIAGNOSIS — R1013 Epigastric pain: Secondary | ICD-10-CM | POA: Diagnosis not present

## 2015-04-21 DIAGNOSIS — I10 Essential (primary) hypertension: Secondary | ICD-10-CM | POA: Diagnosis not present

## 2015-04-21 DIAGNOSIS — E1010 Type 1 diabetes mellitus with ketoacidosis without coma: Secondary | ICD-10-CM | POA: Diagnosis not present

## 2015-04-21 DIAGNOSIS — E1143 Type 2 diabetes mellitus with diabetic autonomic (poly)neuropathy: Secondary | ICD-10-CM | POA: Diagnosis not present

## 2015-04-21 DIAGNOSIS — K920 Hematemesis: Secondary | ICD-10-CM | POA: Diagnosis present

## 2015-04-21 DIAGNOSIS — Z79899 Other long term (current) drug therapy: Secondary | ICD-10-CM | POA: Diagnosis not present

## 2015-04-21 DIAGNOSIS — Z3202 Encounter for pregnancy test, result negative: Secondary | ICD-10-CM | POA: Diagnosis not present

## 2015-04-21 LAB — COMPREHENSIVE METABOLIC PANEL
ALBUMIN: 4.4 g/dL (ref 3.5–5.0)
ALK PHOS: 83 U/L (ref 38–126)
ALT: 10 U/L — AB (ref 14–54)
AST: 16 U/L (ref 15–41)
Anion gap: 18 — ABNORMAL HIGH (ref 5–15)
BILIRUBIN TOTAL: 1.2 mg/dL (ref 0.3–1.2)
BUN: 15 mg/dL (ref 6–20)
CO2: 15 mmol/L — ABNORMAL LOW (ref 22–32)
Calcium: 9.7 mg/dL (ref 8.9–10.3)
Chloride: 106 mmol/L (ref 101–111)
Creatinine, Ser: 0.68 mg/dL (ref 0.44–1.00)
GFR calc Af Amer: >60 mL/min (ref >60)
GFR calc non Af Amer: >60 mL/min (ref >60)
GLUCOSE: 195 mg/dL — AB (ref 65–99)
POTASSIUM: 4.1 mmol/L (ref 3.5–5.1)
Sodium: 139 mmol/L (ref 135–145)
TOTAL PROTEIN: 7.6 g/dL (ref 6.5–8.1)

## 2015-04-21 LAB — GLUCOSE, CAPILLARY
GLUCOSE-CAPILLARY: 322 mg/dL — AB (ref 65–99)
GLUCOSE-CAPILLARY: 203 mg/dL — AB (ref 65–99)
GLUCOSE-CAPILLARY: 177 mg/dL — AB (ref 65–99)
GLUCOSE-CAPILLARY: 158 mg/dL — AB (ref 65–99)
GLUCOSE-CAPILLARY: 156 mg/dL — AB (ref 65–99)
GLUCOSE-CAPILLARY: 129 mg/dL — AB (ref 65–99)
Glucose-Capillary: 501 mg/dL — ABNORMAL HIGH (ref 65–99)
Glucose-Capillary: 248 mg/dL — ABNORMAL HIGH (ref 65–99)
Glucose-Capillary: 180 mg/dL — ABNORMAL HIGH (ref 65–99)
Glucose-Capillary: 141 mg/dL — ABNORMAL HIGH (ref 65–99)
Glucose-Capillary: 133 mg/dL — ABNORMAL HIGH (ref 65–99)

## 2015-04-21 LAB — URINE MICROSCOPIC-ADD ON: RBC / HPF: NONE SEEN RBC/hpf (ref 0–5)

## 2015-04-21 LAB — BASIC METABOLIC PANEL
Anion gap: 22 — ABNORMAL HIGH (ref 5–15)
Anion gap: 15 (ref 5–15)
Anion gap: 9 (ref 5–15)
Anion gap: 7 (ref 5–15)
BUN: 15 mg/dL (ref 6–20)
BUN: 17 mg/dL (ref 6–20)
BUN: 15 mg/dL (ref 6–20)
BUN: 12 mg/dL (ref 6–20)
BUN: 12 mg/dL (ref 6–20)
CALCIUM: 8.9 mg/dL (ref 8.9–10.3)
CHLORIDE: 108 mmol/L (ref 101–111)
CHLORIDE: 109 mmol/L (ref 101–111)
CHLORIDE: 113 mmol/L — AB (ref 101–111)
CO2: 7 mmol/L — AB (ref 22–32)
CO2: <7 mmol/L — ABNORMAL LOW (ref 22–32)
CO2: 7 mmol/L — ABNORMAL LOW (ref 22–32)
CO2: 11 mmol/L — ABNORMAL LOW (ref 22–32)
CO2: 14 mmol/L — ABNORMAL LOW (ref 22–32)
CREATININE: 0.52 mg/dL (ref 0.44–1.00)
Calcium: 8.6 mg/dL — ABNORMAL LOW (ref 8.9–10.3)
Calcium: 8.6 mg/dL — ABNORMAL LOW (ref 8.9–10.3)
Calcium: 8.3 mg/dL — ABNORMAL LOW (ref 8.9–10.3)
Calcium: 8.4 mg/dL — ABNORMAL LOW (ref 8.9–10.3)
Chloride: 112 mmol/L — ABNORMAL HIGH (ref 101–111)
Chloride: 116 mmol/L — ABNORMAL HIGH (ref 101–111)
Creatinine, Ser: 1 mg/dL (ref 0.44–1.00)
Creatinine, Ser: 1.02 mg/dL — ABNORMAL HIGH (ref 0.44–1.00)
Creatinine, Ser: 0.89 mg/dL (ref 0.44–1.00)
Creatinine, Ser: 0.61 mg/dL (ref 0.44–1.00)
GFR calc Af Amer: >60 mL/min (ref >60)
GFR calc Af Amer: >60 mL/min (ref >60)
GFR calc non Af Amer: >60 mL/min (ref >60)
GFR calc non Af Amer: >60 mL/min (ref >60)
GFR calc non Af Amer: >60 mL/min (ref >60)
GFR calc non Af Amer: >60 mL/min (ref >60)
GLUCOSE: 517 mg/dL — AB (ref 65–99)
Glucose, Bld: 417 mg/dL — ABNORMAL HIGH (ref 65–99)
Glucose, Bld: 213 mg/dL — ABNORMAL HIGH (ref 65–99)
Glucose, Bld: 166 mg/dL — ABNORMAL HIGH (ref 65–99)
Glucose, Bld: 160 mg/dL — ABNORMAL HIGH (ref 65–99)
POTASSIUM: 5.9 mmol/L — AB (ref 3.5–5.1)
Potassium: 5.5 mmol/L — ABNORMAL HIGH (ref 3.5–5.1)
Potassium: 4.8 mmol/L (ref 3.5–5.1)
Potassium: 4 mmol/L (ref 3.5–5.1)
Potassium: 3.8 mmol/L (ref 3.5–5.1)
SODIUM: 133 mmol/L — AB (ref 135–145)
SODIUM: 134 mmol/L — AB (ref 135–145)
Sodium: 137 mmol/L (ref 135–145)
Sodium: 134 mmol/L — ABNORMAL LOW (ref 135–145)
Sodium: 136 mmol/L (ref 135–145)

## 2015-04-21 LAB — URINALYSIS, ROUTINE W REFLEX MICROSCOPIC
Bilirubin Urine: NEGATIVE
Hgb urine dipstick: NEGATIVE
Ketones, ur: >80 mg/dL — AB
Nitrite: NEGATIVE
PH: 5.5 (ref 5.0–8.0)
Protein, ur: NEGATIVE mg/dL
SPECIFIC GRAVITY, URINE: 1.027 (ref 1.005–1.030)

## 2015-04-21 LAB — CBC WITH DIFFERENTIAL/PLATELET
Basophils Absolute: 0 10*3/uL (ref 0.0–0.1)
Basophils Relative: 0 %
Eosinophils Absolute: 0 10*3/uL (ref 0.0–0.7)
Eosinophils Relative: 0 %
HCT: 42.9 % (ref 36.0–46.0)
Hemoglobin: 14.2 g/dL (ref 12.0–15.0)
Lymphocytes Relative: 7 %
Lymphs Abs: 2 10*3/uL (ref 0.7–4.0)
MCH: 31.2 pg (ref 26.0–34.0)
MCHC: 33.1 g/dL (ref 30.0–36.0)
MCV: 94.3 fL (ref 78.0–100.0)
Monocytes Absolute: 2 10*3/uL — ABNORMAL HIGH (ref 0.1–1.0)
Monocytes Relative: 7 %
Neutro Abs: 24.2 10*3/uL — ABNORMAL HIGH (ref 1.7–7.7)
Neutrophils Relative %: 86 %
Platelets: 471 10*3/uL — ABNORMAL HIGH (ref 150–400)
RBC: 4.55 MIL/uL (ref 3.87–5.11)
RDW: 13 % (ref 11.5–15.5)
WBC: 28.2 10*3/uL — ABNORMAL HIGH (ref 4.0–10.5)

## 2015-04-21 LAB — CBG MONITORING, ED
GLUCOSE-CAPILLARY: 226 mg/dL — AB (ref 65–99)
Glucose-Capillary: 489 mg/dL — ABNORMAL HIGH (ref 65–99)

## 2015-04-21 LAB — I-STAT BETA HCG BLOOD, ED (MC, WL, AP ONLY): I-stat hCG, quantitative: <5 m[IU]/mL (ref <5)

## 2015-04-21 LAB — LIPASE, BLOOD: Lipase: 18 U/L (ref 11–51)

## 2015-04-21 LAB — CBC
HEMATOCRIT: 44.2 % (ref 36.0–46.0)
Hemoglobin: 14.9 g/dL (ref 12.0–15.0)
MCH: 30.8 pg (ref 26.0–34.0)
MCHC: 33.7 g/dL (ref 30.0–36.0)
MCV: 91.5 fL (ref 78.0–100.0)
Platelets: 395 10*3/uL (ref 150–400)
RBC: 4.83 MIL/uL (ref 3.87–5.11)
RDW: 13.1 % (ref 11.5–15.5)
WBC: 9.4 10*3/uL (ref 4.0–10.5)

## 2015-04-21 MED ORDER — PANTOPRAZOLE SODIUM 40 MG IV SOLR
40.0000 mg | Freq: Once | INTRAVENOUS | Status: AC
  Administered 2015-04-21: 40 mg via INTRAVENOUS
  Filled 2015-04-21: qty 40

## 2015-04-21 MED ORDER — ENOXAPARIN SODIUM 40 MG/0.4ML ~~LOC~~ SOLN
40.0000 mg | SUBCUTANEOUS | Status: DC
Start: 2015-04-21 — End: 2015-04-23
  Administered 2015-04-21 – 2015-04-23 (×3): 40 mg via SUBCUTANEOUS
  Filled 2015-04-21 (×4): qty 0.4

## 2015-04-21 MED ORDER — INSULIN GLARGINE 100 UNIT/ML ~~LOC~~ SOLN
24.0000 [IU] | Freq: Every day | SUBCUTANEOUS | Status: DC
  Administered 2015-04-21 – 2015-04-22 (×2): 24 [IU] via SUBCUTANEOUS
  Filled 2015-04-21 (×2): qty 0.24

## 2015-04-21 MED ORDER — IOPAMIDOL (ISOVUE-300) INJECTION 61%
100.0000 mL | Freq: Once | INTRAVENOUS | Status: AC | PRN
  Administered 2015-04-21: 100 mL via INTRAVENOUS

## 2015-04-21 MED ORDER — DEXTROSE-NACL 5-0.45 % IV SOLN: INTRAVENOUS | Status: DC

## 2015-04-21 MED ORDER — ALBUTEROL SULFATE HFA 108 (90 BASE) MCG/ACT IN AERS: 2.0000 | INHALATION_SPRAY | Freq: Four times a day (QID) | RESPIRATORY_TRACT | Status: DC | PRN

## 2015-04-21 MED ORDER — ONDANSETRON HCL 4 MG/2ML IJ SOLN
4.0000 mg | Freq: Once | INTRAMUSCULAR | Status: AC | PRN
  Administered 2015-04-21: 4 mg via INTRAVENOUS
  Filled 2015-04-21: qty 2

## 2015-04-21 MED ORDER — SODIUM CHLORIDE 0.9 % IV SOLN
INTRAVENOUS | Status: DC
  Administered 2015-04-21: 10:00:00 via INTRAVENOUS

## 2015-04-21 MED ORDER — METOCLOPRAMIDE HCL 5 MG/ML IJ SOLN
10.0000 mg | Freq: Four times a day (QID) | INTRAMUSCULAR | Status: DC | PRN
  Administered 2015-04-21 – 2015-04-22 (×4): 10 mg via INTRAVENOUS
  Filled 2015-04-21 (×4): qty 2

## 2015-04-21 MED ORDER — INSULIN ASPART 100 UNIT/ML ~~LOC~~ SOLN
3.0000 [IU] | Freq: Three times a day (TID) | SUBCUTANEOUS | Status: DC
  Administered 2015-04-22: 3 [IU] via SUBCUTANEOUS

## 2015-04-21 MED ORDER — INSULIN ASPART 100 UNIT/ML ~~LOC~~ SOLN
0.0000 [IU] | Freq: Three times a day (TID) | SUBCUTANEOUS | Status: DC
  Administered 2015-04-22: 8 [IU] via SUBCUTANEOUS
  Administered 2015-04-22: 3 [IU] via SUBCUTANEOUS

## 2015-04-21 MED ORDER — INSULIN ASPART 100 UNIT/ML ~~LOC~~ SOLN: 0.0000 [IU] | Freq: Every day | SUBCUTANEOUS | Status: DC

## 2015-04-21 MED ORDER — ALBUTEROL SULFATE (2.5 MG/3ML) 0.083% IN NEBU: 2.5000 mg | INHALATION_SOLUTION | Freq: Four times a day (QID) | RESPIRATORY_TRACT | Status: DC | PRN

## 2015-04-21 MED ORDER — INSULIN GLARGINE 100 UNIT/ML ~~LOC~~ SOLN: 24.0000 [IU] | Freq: Every day | SUBCUTANEOUS | Status: DC

## 2015-04-21 MED ORDER — SODIUM CHLORIDE 0.9 % IV SOLN: INTRAVENOUS | Status: DC

## 2015-04-21 MED ORDER — DEXTROSE-NACL 5-0.45 % IV SOLN
INTRAVENOUS | Status: DC
  Administered 2015-04-21: 18:00:00 via INTRAVENOUS
  Administered 2015-04-21: 200 mL/h via INTRAVENOUS

## 2015-04-21 MED ORDER — MONTELUKAST SODIUM 10 MG PO TABS
10.0000 mg | ORAL_TABLET | Freq: Every day | ORAL | Status: DC
  Administered 2015-04-21 – 2015-04-23 (×3): 10 mg via ORAL
  Filled 2015-04-21 (×4): qty 1

## 2015-04-21 MED ORDER — ONDANSETRON HCL 4 MG/2ML IJ SOLN
4.0000 mg | Freq: Four times a day (QID) | INTRAMUSCULAR | Status: DC | PRN
  Administered 2015-04-21 – 2015-04-23 (×4): 4 mg via INTRAVENOUS
  Filled 2015-04-21 (×4): qty 2

## 2015-04-21 MED ORDER — SODIUM CHLORIDE 0.9 % IV BOLUS (SEPSIS)
1000.0000 mL | Freq: Once | INTRAVENOUS | Status: AC
  Administered 2015-04-21: 1000 mL via INTRAVENOUS

## 2015-04-21 MED ORDER — PANTOPRAZOLE SODIUM 40 MG IV SOLR
40.0000 mg | Freq: Two times a day (BID) | INTRAVENOUS | Status: DC
  Administered 2015-04-21 – 2015-04-23 (×5): 40 mg via INTRAVENOUS
  Filled 2015-04-21 (×5): qty 40

## 2015-04-21 MED ORDER — SODIUM CHLORIDE 0.9 % IV SOLN
INTRAVENOUS | Status: AC
  Administered 2015-04-21: 4.3 [IU]/h via INTRAVENOUS
  Filled 2015-04-21: qty 2.5

## 2015-04-21 MED ORDER — MOMETASONE FURO-FORMOTEROL FUM 200-5 MCG/ACT IN AERO
2.0000 | INHALATION_SPRAY | Freq: Two times a day (BID) | RESPIRATORY_TRACT | Status: DC
  Filled 2015-04-21: qty 8.8

## 2015-04-21 MED ORDER — METOCLOPRAMIDE HCL 5 MG/ML IJ SOLN
10.0000 mg | Freq: Once | INTRAMUSCULAR | Status: AC
  Administered 2015-04-21: 10 mg via INTRAVENOUS
  Filled 2015-04-21: qty 2

## 2015-04-21 MED ORDER — MORPHINE SULFATE (PF) 2 MG/ML IV SOLN
2.0000 mg | INTRAVENOUS | Status: DC | PRN
  Administered 2015-04-21 – 2015-04-22 (×7): 2 mg via INTRAVENOUS
  Filled 2015-04-21 (×7): qty 1

## 2015-04-21 NOTE — ED Notes (Signed)
MD at bedside. EDOP BELFIE MADE AWARE OF PT CURRENT VITAL SIGNS AND CURRENT STATUS

## 2015-04-21 NOTE — ED Provider Notes (Signed)
Care taken over from Dr. Christy Gentles.  PT's repeat labs are worse, with glucose >500, bicarb has dropped to 7.  Pt has already had 3 liters of NS, started on maintenance.  Started pt on glucostabilizer protocol.  Her HR is in 130s, she is tachypneic.  Consulted with Dr. Dreama Saa with [facility] who will admit pt to step-down.  Results for orders placed or performed during the hospital encounter of 04/20/15  Lipase, blood  Result Value Ref Range   Lipase 18 11 - 51 U/L  Comprehensive metabolic panel  Result Value Ref Range   Sodium 139 135 - 145 mmol/L   Potassium 4.1 3.5 - 5.1 mmol/L   Chloride 106 101 - 111 mmol/L   CO2 15 (L) 22 - 32 mmol/L   Glucose, Bld 195 (H) 65 - 99 mg/dL   BUN 15 6 - 20 mg/dL   Creatinine, Ser 0.68 0.44 - 1.00 mg/dL   Calcium 9.7 8.9 - 10.3 mg/dL   Total Protein 7.6 6.5 - 8.1 g/dL   Albumin 4.4 3.5 - 5.0 g/dL   AST 16 15 - 41 U/L   ALT 10 (L) 14 - 54 U/L   Alkaline Phosphatase 83 38 - 126 U/L   Total Bilirubin 1.2 0.3 - 1.2 mg/dL   GFR calc non Af Amer >60 >60 mL/min   GFR calc Af Amer >60 >60 mL/min   Anion gap 18 (H) 5 - 15  CBC  Result Value Ref Range   WBC 9.4 4.0 - 10.5 K/uL   RBC 4.83 3.87 - 5.11 MIL/uL   Hemoglobin 14.9 12.0 - 15.0 g/dL   HCT 44.2 36.0 - 46.0 %   MCV 91.5 78.0 - 100.0 fL   MCH 30.8 26.0 - 34.0 pg   MCHC 33.7 30.0 - 36.0 g/dL   RDW 13.1 11.5 - 15.5 %   Platelets 395 150 - 400 K/uL  Urinalysis, Routine w reflex microscopic (not at [facility])  Result Value Ref Range   Color, Urine YELLOW YELLOW   APPearance CLOUDY (A) CLEAR   Specific Gravity, Urine 1.027 1.005 - 1.030   pH 5.5 5.0 - 8.0   Glucose, UA >1000 (A) NEGATIVE mg/dL   Hgb urine dipstick NEGATIVE NEGATIVE   Bilirubin Urine NEGATIVE NEGATIVE   Ketones, ur >80 (A) NEGATIVE mg/dL   Protein, ur NEGATIVE NEGATIVE mg/dL   Nitrite NEGATIVE NEGATIVE   Leukocytes, UA TRACE (A) NEGATIVE  Urine microscopic-add on  Result Value Ref Range   Squamous Epithelial / LPF 0-5 (A) NONE SEEN   WBC, UA 0-5 0 - 5 WBC/hpf   RBC / HPF NONE SEEN 0 - 5 RBC/hpf   Bacteria, UA RARE (A) NONE SEEN   Casts HYALINE CASTS (A) NEGATIVE   Urine-Other YEAST PRESENT   Basic metabolic panel  Result Value Ref Range   Sodium 137 135 - 145 mmol/L   Potassium 5.5 (H) 3.5 - 5.1 mmol/L   Chloride 108 101 - 111 mmol/L   CO2 7 (L) 22 - 32 mmol/L   Glucose, Bld 517 (H) 65 - 99 mg/dL   BUN 15 6 - 20 mg/dL   Creatinine, Ser 1.00 0.44 - 1.00 mg/dL   Calcium 8.9 8.9 - 10.3 mg/dL   GFR calc non Af Amer >60 >60 mL/min   GFR calc Af Amer >60 >60 mL/min   Anion gap 22 (H) 5 - 15  I-Stat beta hCG blood, ED ([facility], [facility], [facility] only)  Result Value Ref Range   I-stat hCG,  quantitative <5.0 <5 mIU/mL   Comment 3          CBG monitoring, ED  Result Value Ref Range   Glucose-Capillary 226 (H) 65 - 99 mg/dL  CBG monitoring, ED  Result Value Ref Range   Glucose-Capillary 489 (H) 65 - 99 mg/dL   Dg Abd 2 Views  04/11/2015  CLINICAL DATA:  Intractable nausea and vomiting, abdominal pain EXAM: ABDOMEN - 2 VIEW COMPARISON:  None. FINDINGS: The bowel gas pattern is normal. There is no evidence of free air. No radio-opaque calculi or other significant radiographic abnormality is seen. IMPRESSION: Negative. Electronically Signed   By: Kathreen Devoid   On: 04/11/2015 13:51      CRITICAL CARE Performed by: Malvin Johns Total critical care time: 30 minutes Critical care time was exclusive of separately billable procedures and treating other patients. Critical care was necessary to treat or prevent imminent or life-threatening deterioration. Critical care was time spent personally by me on the following activities: development of treatment plan with patient and/or surrogate as well as nursing, discussions with consultants, evaluation of patient's response to treatment, examination of patient, obtaining history from patient or surrogate, ordering and performing treatments and interventions, ordering and review of laboratory  studies, ordering and review of radiographic studies, pulse oximetry and re-evaluation of patient's condition.   Malvin Johns, MD 04/21/15 [phone]

## 2015-04-21 NOTE — ED Notes (Signed)
MD at bedside. ADMITTING MD PRESENT 

## 2015-04-21 NOTE — H&P (Signed)
History and Physical  [name] [id number] DOB: 1990/11/17 DOA: 04/20/2015  PCP: Vicenta Aly, FNP   Chief Complaint: nausea and vomiting   History of Present Illness:  Patient is a 25 yo female with history of type I DM who came to the ED with cc of nausea and vomiting for the past few days that has not resolved with oral meds at home, poor oral intake, and epigastric/periubilical abdominal pain that started after N/V. She also had couple of episodes of hematemesis over the last 2 days. She has no diarrhea. No fever/chills. No chest pain or cough but she had dyspnea. No dysuria. No headache. She has had significant weight loss of over 15 lbs per mom with night sweats over the last few weeks and several ER admissions. She is complaint with medications.   Review of Systems:  CONSTITUTIONAL:  +night sweats.  +fatigue, malaise, lethargy.  No fever or chills. Eyes:  No visual changes.  No eye pain.  No eye discharge.   ENT:    No epistaxis.  No sinus pain.  No sore throat.  No ear pain.  No congestion. RESPIRATORY:  No cough.  No wheeze.  No hemoptysis.  +shortness of breath. CARDIOVASCULAR:  No chest pains.  +palpitations. GASTROINTESTINAL:  +abdominal pain.  +nausea +vomiting.  No diarrhea or constipation.  +hematemesis.  No hematochezia.  No melena. GENITOURINARY:  No urgency.  No frequency.  No dysuria.  No hematuria.  No obstructive symptoms.  No discharge.  No pain.  No significant abnormal bleeding. MUSCULOSKELETAL:  No musculoskeletal pain.  No joint swelling.  No arthritis. NEUROLOGICAL:  No confusion.  +weakness. No headache. No seizure. PSYCHIATRIC:  No depression. No anxiety. No suicidal ideation. SKIN:  No rashes.  No lesions.  No wounds. ENDOCRINE:  No unexplained weight loss.  No polydipsia.  +polyuria.  No polyphagia. HEMATOLOGIC:  No anemia.  No purpura.  No petechiae.  No bleeding.  ALLERGIC AND IMMUNOLOGIC:  No pruritus.  No swelling Other:  Past  Medical and Surgical History:   Past Medical History  Diagnosis Date  . Diabetes mellitus   . Asthma   . Pancreatitis, acute 11/26/2014  . Hepatic steatosis 11/26/2014  . Liver mass 11/26/2014  . Hypertension     NOT CURRENTLY ON ANY BP MED  . Heart murmur   . Arthritis   . Gallstones   . Anxiety    Past Surgical History  Procedure Laterality Date  . Wisdom tooth extraction    . Esophagogastroduodenoscopy (egd) with propofol Left 09/20/2014    Procedure: ESOPHAGOGASTRODUODENOSCOPY (EGD) WITH PROPOFOL;  Surgeon: Arta Silence, MD;  Location: [facility] ENDOSCOPY;  Service: Endoscopy;  Laterality: Left;  . Cholecystectomy N/A 02/11/2015    Procedure: LAPAROSCOPIC CHOLECYSTECTOMY WITH INTRAOPERATIVE CHOLANGIOGRAM;  Surgeon: Greer Pickerel, MD;  Location: [facility] ORS;  Service: General;  Laterality: N/A;    Social History:   reports that she has never smoked. She has never used smokeless tobacco. She reports that she uses illicit drugs (Marijuana). She reports that she does not drink alcohol.   Allergies  Allergen Reactions  . Peanut-Containing Drug Products Swelling    Swelling of mouth, lips  . Strawberry Extract Swelling    Swelling of mouth, lips    Family History  Problem Relation Age of Onset  . Diabetes Mother   . Hyperlipidemia Mother   . Hypertension Father   . Heart disease Father   . Heart disease Maternal Grandmother   . Heart disease Maternal Grandfather   .  Hypertension Paternal Grandmother   . Cancer Paternal Grandfather       Prior to Admission medications   Medication Sig Start Date End Date Taking? Authorizing Provider  albuterol (PROVENTIL HFA;VENTOLIN HFA) 108 (90 BASE) MCG/ACT inhaler Inhale 2 puffs into the lungs every 6 (six) hours as needed for shortness of breath.   Yes Historical Provider, MD  albuterol (PROVENTIL) (2.5 MG/3ML) 0.083% nebulizer solution Take 2.5 mg by nebulization every 6 (six) hours as needed for shortness of breath.   Yes Historical Provider,  MD  budesonide-formoterol (SYMBICORT) 160-4.5 MCG/ACT inhaler Inhale 2 puffs into the lungs 2 (two) times daily as needed (SOB, wheezing).    Yes Historical Provider, MD  insulin aspart (NOVOLOG FLEXPEN) 100 UNIT/ML FlexPen INJECT 0-20 UNITS INTO THE SKIN AS DIRECTED. SSI - CARB SCALE, 0-20 UNITS Patient taking differently: Inject into the skin 4 (four) times daily as needed for high blood sugar. Sliding scale 10/19/13  Yes Elayne Snare, MD  Insulin Glargine (LANTUS SOLOSTAR) 100 UNIT/ML Solostar Pen Inject 36 Units into the skin daily at 10 pm. Patient taking differently: Inject 24 Units into the skin daily at 10 pm.  10/19/13  Yes Elayne Snare, MD  metoCLOPramide (REGLAN) 10 MG tablet Take 1 tablet (10 mg total) by mouth 4 (four) times daily. 04/15/15  Yes [name], DO  montelukast (SINGULAIR) 10 MG tablet Take 10 mg by mouth daily.   Yes Historical Provider, MD  ondansetron (ZOFRAN) 4 MG tablet Take 1 tablet (4 mg total) by mouth every 6 (six) hours as needed for nausea. 04/15/15  Yes [name], DO  EPINEPHrine (EPIPEN) 0.3 mg/0.3 mL SOAJ injection Inject 0.3 mg into the muscle once.    Historical Provider, MD    Physical Exam: BP 141/88 mmHg  Pulse 132  Temp(Src) 97.7 F (36.5 C) (Oral)  Resp 26  SpO2 100%  LMP 03/19/2015  GENERAL : Well developed, well nourished, alert and cooperative, and appears to be in moderate acute distress. HEAD: normocephalic. EYES: PERRL, EOMI.  NOSE: No nasal discharge. THROAT: Oral cavity and pharynx: dry. NECK: Neck supple CARDIAC: Normal S1 and S2. No S3, S4 or murmurs. tachycardic. There is no peripheral edema, LUNGS: Clear to auscultation  ABDOMEN: Positive bowel sounds. Soft, nondistended, tender to palpation. No guarding or rebound. No masses. EXTREMITIES: No significant deformity or joint abnormality.  NEUROLOGICAL: The mental examination revealed the patient was oriented to person, place, and time.CN II-XII intact. Strength and sensation  symmetric and intact throughout.  SKIN: Skin normal color PSYCHIATRIC:  The patient was able to demonstrate good judgement and reason, without hallucinations, abnormal affect or abnormal behaviors during the examination. Patient is not suicidal.          Labs on Admission:  Reviewed.   Radiological Exams on Admission: No results found.    Assessment/Plan  DKA: Admit to step down, insulin drip per protocol for DKA, monitor electrolytes Q4H IVF per DKA protocol, NS then switch to D5/half normal NPO for now until AG closes reglan prn PPI IV BID Recheck CBC for hematemesis  Consult to GI  Asthma: continue Singulair and albuterol  Multiple admissions for DKA but also history of night sweats/fatigue/weight loss/poor appetite: will check CT abdomen/pelvis   DVT prophylaxis: [place] enoxaparin GI prophylaxis:PPI Consultants: GI Code Status: Full     Gennaro Africa M.D [facility]

## 2015-04-21 NOTE — ED Provider Notes (Signed)
CSN: [id number]     Arrival date & time 04/20/15  2357 History  By signing my name below, I, [name], attest that this documentation has been prepared under the direction and in the presence of Ripley Fraise, MD. Electronically Signed: [name], ED Scribe. 04/21/2015. 12:41 AM.     Chief Complaint  Patient presents with  . Emesis   Patient is a 25 y.o. female presenting with vomiting. The history is provided by the patient. No language interpreter was used.  Emesis  HPI Comments: [name] is a 25 y.o. female with a hx of DM, HTN, pancreatitis, and hepatic steatosis who presents to the Emergency Department complaining of multiple episodes of moderate non-bloody vomiting onset several days ago. She reports associated moderate generalized abdominal pain. She states that her glucose level was approx. 111 PTA and that she is currently on Insulin injections. No alleviating factors noted. Pt has not taken any medications PTA. She was seen on 04/08/15 for similar symptoms where she was in DKA and given insulin drip. She denies any diarrhea, cough, SOB, CP, or back pain.    Past Medical History  Diagnosis Date  . Diabetes mellitus   . Asthma   . Pancreatitis, acute 11/26/2014  . Hepatic steatosis 11/26/2014  . Liver mass 11/26/2014  . Hypertension     NOT CURRENTLY ON ANY BP MED  . Heart murmur   . Arthritis   . Gallstones   . Anxiety    Past Surgical History  Procedure Laterality Date  . Wisdom tooth extraction    . Esophagogastroduodenoscopy (egd) with propofol Left 09/20/2014    Procedure: ESOPHAGOGASTRODUODENOSCOPY (EGD) WITH PROPOFOL;  Surgeon: Arta Silence, MD;  Location: [facility] ENDOSCOPY;  Service: Endoscopy;  Laterality: Left;  . Cholecystectomy N/A 02/11/2015    Procedure: LAPAROSCOPIC CHOLECYSTECTOMY WITH INTRAOPERATIVE CHOLANGIOGRAM;  Surgeon: Greer Pickerel, MD;  Location: [facility] ORS;  Service: General;  Laterality: N/A;   Family History  Problem Relation Age of  Onset  . Diabetes Mother   . Hyperlipidemia Mother   . Hypertension Father   . Heart disease Father   . Heart disease Maternal Grandmother   . Heart disease Maternal Grandfather   . Hypertension Paternal Grandmother   . Cancer Paternal Grandfather    Social History  Substance Use Topics  . Smoking status: Never Smoker   . Smokeless tobacco: Never Used  . Alcohol Use: No   OB History    Gravida Para Term Preterm AB TAB SAB Ectopic Multiple Living   2 1 0 1 1 1 0 0 0 1      Review of Systems  Constitutional: Positive for fatigue.  Gastrointestinal: Positive for nausea and vomiting.  All other systems reviewed and are negative.     Allergies  Peanut-containing drug products and Strawberry extract  Home Medications   Prior to Admission medications   Medication Sig Start Date End Date Taking? Authorizing Provider  albuterol (PROVENTIL HFA;VENTOLIN HFA) 108 (90 BASE) MCG/ACT inhaler Inhale 2 puffs into the lungs every 6 (six) hours as needed for shortness of breath.    Historical Provider, MD  albuterol (PROVENTIL) (2.5 MG/3ML) 0.083% nebulizer solution Take 2.5 mg by nebulization every 6 (six) hours as needed for shortness of breath.    Historical Provider, MD  budesonide-formoterol (SYMBICORT) 160-4.5 MCG/ACT inhaler Inhale 2 puffs into the lungs 2 (two) times daily as needed (SOB, wheezing).     Historical Provider, MD  EPINEPHrine (EPIPEN) 0.3 mg/0.3 mL SOAJ injection Inject 0.3 mg into  the muscle once.    Historical Provider, MD  insulin aspart (NOVOLOG FLEXPEN) 100 UNIT/ML FlexPen INJECT 0-20 UNITS INTO THE SKIN AS DIRECTED. SSI - CARB SCALE, 0-20 UNITS Patient taking differently: Inject into the skin 4 (four) times daily as needed for high blood sugar. Sliding scale 10/19/13   Elayne Snare, MD  Insulin Glargine (LANTUS SOLOSTAR) 100 UNIT/ML Solostar Pen Inject 36 Units into the skin daily at 10 pm. Patient taking differently: Inject 24 Units into the skin daily at 10 pm.   10/19/13   Elayne Snare, MD  metoCLOPramide (REGLAN) 10 MG tablet Take 1 tablet (10 mg total) by mouth 4 (four) times daily. 04/15/15   [name], DO  montelukast (SINGULAIR) 10 MG tablet Take 10 mg by mouth daily.    Historical Provider, MD  ondansetron (ZOFRAN) 4 MG tablet Take 1 tablet (4 mg total) by mouth every 6 (six) hours as needed for nausea. 04/15/15   [name], DO   BP 128/97 mmHg  Pulse 124  Temp(Src) 98.1 F (36.7 C) (Oral)  Resp 20  SpO2 100%  LMP 03/19/2015 Physical Exam CONSTITUTIONAL: Chronically ill-appearing. Pt smells ketotic  HEAD: Normocephalic/atraumatic EYES: EOMI/PERRL ENMT: Mucous membranes dry NECK: supple no meningeal signs SPINE/BACK:entire spine nontender CV: S1/S2 noted, no murmurs/rubs/gallops noted LUNGS: Lungs are clear to auscultation bilaterally, no apparent distress ABDOMEN: soft, nontender, no rebound or guarding, bowel sounds noted throughout abdomen GU:no cva tenderness NEURO: Pt is awake/alert/appropriate, moves all extremitiesx4.  No facial droop.   EXTREMITIES: pulses normal/equal, full ROM SKIN: warm, color normal PSYCH: Anxious ED Course  Procedures  DIAGNOSTIC STUDIES: Oxygen Saturation is 100% on RA, normal by my interpretation.    COORDINATION OF CARE: 12:38 AM- Pt advised of plan for treatment and pt agrees. Pt will receive IV fluids and Zofran IM.   5:19 AM After monitoring in the ED, pt is improving No further vomiting IV fluids infusing Will need to have BMP recheck She just got discharged from hospital for similar episode She has h/o hematemesis previously, likely due to forceful vomiting per recent chart, HGB stable, having dark emesis in the ED after arrival, but this is improved 8:22 AM Pt without any further vomiting episodes, though still tachycardic Repeat BMP pending Plan at signout to dr Tamera Punt, f/u on labs and is not improved will need admitted Pt was just discharged from hospital on 3/27 Labs  Review Labs Reviewed  COMPREHENSIVE METABOLIC PANEL - Abnormal; Notable for the following:    CO2 15 (*)    Glucose, Bld 195 (*)    ALT 10 (*)    Anion gap 18 (*)    All other components within normal limits  URINALYSIS, ROUTINE W REFLEX MICROSCOPIC (NOT AT [facility]) - Abnormal; Notable for the following:    APPearance CLOUDY (*)    Glucose, UA >1000 (*)    Ketones, ur >80 (*)    Leukocytes, UA TRACE (*)    All other components within normal limits  URINE MICROSCOPIC-ADD ON - Abnormal; Notable for the following:    Squamous Epithelial / LPF 0-5 (*)    Bacteria, UA RARE (*)    Casts HYALINE CASTS (*)    All other components within normal limits  CBG MONITORING, ED - Abnormal; Notable for the following:    Glucose-Capillary 226 (*)    All other components within normal limits  LIPASE, BLOOD  CBC  BASIC METABOLIC PANEL  I-STAT BETA HCG BLOOD, ED ([facility], [facility], [facility] ONLY)  I have personally reviewed and evaluated these lab results as part of my medical decision-making.    MDM   Final diagnoses:  Dehydration  Diabetic gastroparesis ([place])    Nursing notes including past medical history and social history reviewed and considered in documentation Labs/vital reviewed myself and considered during evaluation Previous records reviewed and considered    I personally performed the services described in this documentation, which was scribed in my presence. The recorded information has been reviewed and is accurate.       Ripley Fraise, MD 04/21/15 [phone]

## 2015-04-21 NOTE — ED Notes (Addendum)
CHANGE BED TO 33. Dante

## 2015-04-22 DIAGNOSIS — E1010 Type 1 diabetes mellitus with ketoacidosis without coma: Principal | ICD-10-CM

## 2015-04-22 DIAGNOSIS — E1143 Type 2 diabetes mellitus with diabetic autonomic (poly)neuropathy: Secondary | ICD-10-CM | POA: Insufficient documentation

## 2015-04-22 DIAGNOSIS — E860 Dehydration: Secondary | ICD-10-CM | POA: Insufficient documentation

## 2015-04-22 DIAGNOSIS — K3184 Gastroparesis: Secondary | ICD-10-CM

## 2015-04-22 LAB — BASIC METABOLIC PANEL
ANION GAP: 7 (ref 5–15)
Anion gap: 7 (ref 5–15)
BUN: 12 mg/dL (ref 6–20)
BUN: 11 mg/dL (ref 6–20)
CHLORIDE: 116 mmol/L — AB (ref 101–111)
CO2: 13 mmol/L — ABNORMAL LOW (ref 22–32)
CO2: 15 mmol/L — ABNORMAL LOW (ref 22–32)
CREATININE: 0.48 mg/dL (ref 0.44–1.00)
Calcium: 8.3 mg/dL — ABNORMAL LOW (ref 8.9–10.3)
Calcium: 8.3 mg/dL — ABNORMAL LOW (ref 8.9–10.3)
Chloride: 115 mmol/L — ABNORMAL HIGH (ref 101–111)
Creatinine, Ser: 0.48 mg/dL (ref 0.44–1.00)
GFR calc Af Amer: >60 mL/min (ref >60)
GFR calc Af Amer: >60 mL/min (ref >60)
GFR calc non Af Amer: >60 mL/min (ref >60)
GLUCOSE: 166 mg/dL — AB (ref 65–99)
GLUCOSE: 156 mg/dL — AB (ref 65–99)
POTASSIUM: 3.7 mmol/L (ref 3.5–5.1)
POTASSIUM: 3.5 mmol/L (ref 3.5–5.1)
Sodium: 136 mmol/L (ref 135–145)
Sodium: 137 mmol/L (ref 135–145)

## 2015-04-22 LAB — GLUCOSE, CAPILLARY
GLUCOSE-CAPILLARY: 157 mg/dL — AB (ref 65–99)
GLUCOSE-CAPILLARY: 152 mg/dL — AB (ref 65–99)
GLUCOSE-CAPILLARY: 154 mg/dL — AB (ref 65–99)
GLUCOSE-CAPILLARY: 254 mg/dL — AB (ref 65–99)
GLUCOSE-CAPILLARY: 150 mg/dL — AB (ref 65–99)
Glucose-Capillary: 80 mg/dL (ref 65–99)

## 2015-04-22 LAB — CBC
HEMATOCRIT: 34.4 % — AB (ref 36.0–46.0)
Hemoglobin: 11.8 g/dL — ABNORMAL LOW (ref 12.0–15.0)
MCH: 30.5 pg (ref 26.0–34.0)
MCHC: 34.3 g/dL (ref 30.0–36.0)
MCV: 88.9 fL (ref 78.0–100.0)
PLATELETS: 298 10*3/uL (ref 150–400)
RBC: 3.87 MIL/uL (ref 3.87–5.11)
RDW: 13 % (ref 11.5–15.5)
WBC: 9.8 10*3/uL (ref 4.0–10.5)

## 2015-04-22 MED ORDER — POTASSIUM CHLORIDE IN NACL 20-0.9 MEQ/L-% IV SOLN
INTRAVENOUS | Status: DC
  Administered 2015-04-22: 08:00:00 via INTRAVENOUS
  Administered 2015-04-22: 1000 mL via INTRAVENOUS
  Administered 2015-04-23: via INTRAVENOUS
  Filled 2015-04-22 (×8): qty 1000

## 2015-04-22 MED ORDER — METOCLOPRAMIDE HCL 5 MG/ML IJ SOLN
5.0000 mg | Freq: Three times a day (TID) | INTRAMUSCULAR | Status: DC
Start: 2015-04-22 — End: 2015-04-23
  Administered 2015-04-22 – 2015-04-23 (×2): 5 mg via INTRAVENOUS
  Filled 2015-04-22 (×2): qty 2

## 2015-04-22 MED ORDER — UNJURY CHICKEN SOUP POWDER
8.0000 [oz_av] | Freq: Two times a day (BID) | ORAL | Status: DC
  Administered 2015-04-22: 8 [oz_av] via ORAL
  Filled 2015-04-22 (×4): qty 27

## 2015-04-22 MED ORDER — SODIUM CHLORIDE 0.9 % IV SOLN: INTRAVENOUS | Status: DC

## 2015-04-22 NOTE — Care Management Note (Signed)
Case Management Note  Patient Details  Name: [name] MRN: [id number] Date of Birth: 05-02-1990  Subjective/Objective:             dka       Action/Plan:Date:  April 22, 2015 Chart reviewed for concurrent status and case management needs. Will continue to follow patient for changes and needs: Velva Harman, BSN, RN, [place]   [phone]   Expected Discharge Date:                  Expected Discharge Plan:  Home/Self Care  In-House Referral:  NA  Discharge planning Services  CM Consult  Post Acute Care Choice:  NA Choice offered to:  NA  DME Arranged:    DME Agency:     HH Arranged:    [place] Agency:     Status of Service:  Completed, signed off  Medicare Important Message Given:    Date Medicare IM Given:    Medicare IM give by:    Date Additional Medicare IM Given:    Additional Medicare Important Message give by:     If discussed at [place] of Stay Meetings, dates discussed:    Additional Comments:  Leeroy Cha, RN 04/22/2015, 10:22 AM

## 2015-04-22 NOTE — Progress Notes (Signed)
   04/22/15 1000  Clinical Encounter Type  Visited With Patient  Visit Type Initial;Psychological support;Spiritual support;Critical Care  Spiritual Encounters  Spiritual Needs Emotional;Other (Comment) (Pastoral Conversation)  Stress Factors  Patient Stress Factors Health changes   I visited with [name] while doing rounds in the ICU. I have seen her multiple times in the census. Upon visiting with [name] she stated that it was hard being in the hospital so many times; but stated that she was ok for now.   Please contact Spiritual Care for further assistance.    [place] M.Div.

## 2015-04-22 NOTE — Progress Notes (Signed)
Initial Nutrition Assessment  DOCUMENTATION CODES:   Severe malnutrition in context of acute illness/injury  INTERVENTION:   Provide Unjury Chicken Soup BID, each provides 100 kcal and 21g protein RD to continue to monitor for supplement and educational needs  NUTRITION DIAGNOSIS:   Malnutrition related to acute illness as evidenced by percent weight loss, energy intake < or equal to 50% for > or equal to 5 days.  GOAL:   Patient will meet greater than or equal to 90% of their needs  MONITOR:   PO intake, Supplement acceptance, Diet advancement, Labs, Weight trends, I & O's  REASON FOR ASSESSMENT:   Malnutrition Screening Tool    ASSESSMENT:   25 year old female with a history of diabetes mellitus type 1, recent pancreatitis, liver adenomas, anxiety, hepatic steatosis, asthma presented with 2-3 day history of nausea, vomiting, and diarrhea as well as generalized myalgias and arthralgias. The patient has had numerous admissions in the past 6 months secondary to DKA. Most recently, the patient was discharged after an admission from 03/21/2015 through 03/25/2015 and an admission from 04/08/2015 through 04/15/2015. During her admissions, it has been characteristic for slow recovery due to slow recovering metabolic acidosis which is initially gapped and subsequently nongapped. In addition, her admissions have been characterized by profound volume depletion requiring significant fluid resuscitation which subsequently resulted in improvement in the patient's sinus tachycardia and metabolic acidosis. Finally, her previous admissions have been characterized by opioid dependence for her "abdominal pain"which has resulted in exacerbation of her gastroparesis resulting in slow recovery of her nausea and vomiting.  Patient reports N/V PTA that did not improve after previous admission last week. Pt reports she was discharged on Zofran and Reglan which did not help her symptoms. Pt states her blood  sugars were within normal range. She has been diagnosed with gastroparesis and she has liver masses. Pt plans to see a GI doctor and liver doctor for these conditions after discharge. During her previous admission last week she was being set up for outpatient diabetes education, she reports she has not seen anyone yet. Pt is still interested in diet education when more appropriate.  Pt is on clear liquids, she only consumed diet gingerale this morning. Pt is willing to try Un[place] Chicken Soup, RD to order. Pt has consumed very little over the past week. Per weight history, pt has lost 10 lb since 3/27 (9% wt loss x 1 week, significant for time frame). No signs of muscle or fat depletion.  Medications: Reglan PRN, Morphine PRN  Labs reviewed: CBGs: 152-157 A1c: 9.3  Diet Order:  Diet clear liquid Room service appropriate?: Yes; Fluid consistency:: Thin  Skin:  Reviewed, no issues  Last BM:  3/20  Height:   Ht Readings from Last 1 Encounters:  04/21/15 5' (1.524 m)    Weight:   Wt Readings from Last 1 Encounters:  04/21/15 103 lb 13.4 oz (47.1 kg)    Ideal Body Weight:  45.5 kg  BMI:  Body mass index is 20.28 kg/(m^2).  Estimated Nutritional Needs:   Kcal:  1400-1600  Protein:  70-80g  Fluid:  1.6L/day  EDUCATION NEEDS:   Education needs no appropriate at this time (Patient still interested diet education)  Clayton Bibles, MS, RD, LDN Pager: [phone] After Hours Pager: [phone]

## 2015-04-22 NOTE — Progress Notes (Signed)
PROGRESS NOTE  [name] [id number] DOB: 09/27/1990 DOA: 04/20/2015 PCP: Vicenta Aly, FNP Brief History 25 year old female with a history of diabetes mellitus type 1, recent pancreatitis, liver adenomas, anxiety, hepatic steatosis, asthma presented with 2-3 day history of nausea, vomiting, and diarrhea as well as generalized myalgias and arthralgias. The patient has had numerous admissions in the past 6 months secondary to DKA. Most recently, the patient was discharged after an admission from 03/21/2015 through 03/25/2015 and an admission from 04/08/2015 through 04/15/2015. During her admissions, it has been characteristic for slow recovery due to slow recovering metabolic acidosis which is initially gapped and subsequently nongapped. In addition, her admissions have been characterized by profound volume depletion requiring significant fluid resuscitation which subsequently resulted in improvement in the patient's sinus tachycardia and metabolic acidosis. Finally, her previous admissions have been characterized by opioid dependence for her "abdominal pain"which has resulted in exacerbation of her gastroparesis resulting in slow recovery of her nausea and vomiting. On this admission, the patient once again presented with a gapped metabolic acidosis with serum glucose of 517. The patient was started on an intravenous insulin drip and aggressive fluid resuscitation. Her anion gap has closed, although the patient continues to have a non-gapped metabolic acidosis. She has been transitioned to subcutaneous insulin.  Assessment/Plan: Diabetic ketoacidosis -Continue insulin drip-->transition to [place] insulin -continue Lantus 24 units daily -continue sensitive SSI -Continue fluid resuscitation -04/13/2015 hemoglobin A1c 9.3 -03/22/2015 UDS positive for opiates Nausea and vomiting/Abdominal pain -likely gastroparesis related to her DM -04/21/2015 CT abdomen and pelvis--stable hepatic  masses, normal pancreas, normal bowel wall thickening or inflammation, normal spleen and pancreas Leukocytosis -Given the patient's low-grade fever, myalgias, arthralgias--check influenza--negative -?stress demargination -Urinalysis negative for pyuria -improved Hematemesis/upper GI bleed -Hemoglobin appears to be stable, likely secondary to forceful vomiting, 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 -start PPI -Patient had same complaint on previous admission on 04/10/2015 -Although the patient has dilution drop in her hemoglobin, her hemoglobin overall remained stable when compared to her multiple admissions -Continue to trend hemoglobin Asthma -No wheezing or stridor -Stable on room air -Continue LABA -continue singulair Sinus tachycardia -Secondary to stress response from the patient's volume depletion, vomiting, and pain -As discussed, aggressive fluid resuscitation and conservative measures usually results in an improvement -continue IVF -Echocardiogram 05/24/2014 showed EF Q000111Q, grade 1 diastolic dysfunction -[id number] Elevated blood pressure -Likely situational without diagnosis of hypertension -Overall, the preponderance of her blood pressures eventually normalizes -Do not plan to start anti-HTN regimen    Family Communication:   No family at beside Disposition Plan:   Home when medically stable Total time 35 min; >50% spent counseling pt and coordinating care      Procedures/Studies: Ct Abdomen Pelvis W Contrast  04/21/2015  CLINICAL DATA:  Moderate generalized abdominal pain, loss of approximately 15 pounds over the last few weeks with several ER admissions. History of diabetes, hypertension, pancreatitis and hepatic steatosis presenting to the ER complaining of multiple episodes of moderate nonbloody vomiting onset several days ago. History of previous hematemesis. EXAM: CT ABDOMEN AND PELVIS WITH CONTRAST TECHNIQUE: Multidetector CT imaging of the  abdomen and pelvis was performed using the standard protocol following bolus administration of intravenous contrast. CONTRAST:  1107mL ISOVUE-300 IOPAMIDOL (ISOVUE-300) INJECTION 61% COMPARISON:  CT abdomen dated 11/25/2014. FINDINGS: Lower chest:  No acute findings. Hepatobiliary: Stable hypodense mass within the right liver lobe, segment 5, measures 2.6 x 1.7 cm. Additional small mass within the left liver lobe,  segment 2, measures 1 cm greatest dimension. Two additional masses were seen on the ultrasound of 12/18/2014. Liver again appears enlarged and low in density suggesting fatty infiltration. Patient is status post cholecystectomy. No bile duct dilatation. Pancreas: No mass, inflammatory changes, or other significant abnormality. Spleen: Within normal limits in size and appearance. Adrenals/Urinary Tract: No masses identified. No evidence of hydronephrosis. There is mild pelviectasis bilaterally due to prominent bladder distension. Stomach/Bowel: Bowel is normal in caliber. No bowel wall thickening or convincing evidence of bowel wall inflammation seen. Appendix is not seen but there are no inflammatory changes about the cecum to suggest acute appendicitis. Stomach appears normal. Vascular/Lymphatic: No pathologically enlarged lymph nodes. No evidence of abdominal aortic aneurysm. Reproductive: No mass or other significant abnormality. Other: No free fluid or abscess collections seen. No free intraperitoneal air. Musculoskeletal: No acute or suspicious osseous lesion. Superficial soft tissues are unremarkable. IMPRESSION: 1. Two liver lesions, largest within the right hepatic lobe (bordering segments 5 and 6) is stable in size measuring 2.6 x 1.7 cm. 1 cm lesion within the left hepatic lobe (segment 2), was not visible on the earlier CT but was seen on an earlier ultrasound and MRI. Four separate liver lesions were identified on the abdomen ultrasound of 12/18/2014 and on earlier MRI of 11/27/2014. On the MRI  report, these were favored to represent benign hepatic adenomas. Six-month follow-up liver MRI was recommended on that report which would be due in May of 2017. 2. Hepatomegaly with probable fatty infiltration, as also described on previous reports. 3. Bladder distension, at least moderate in degree. Bladder otherwise unremarkable. 4. Remainder of the abdomen and pelvis CT is unremarkable, as detailed above. Electronically Signed   By: Franki Cabot M.D.   On: 04/21/2015 12:18   Dg Abd 2 Views  04/11/2015  CLINICAL DATA:  Intractable nausea and vomiting, abdominal pain EXAM: ABDOMEN - 2 VIEW COMPARISON:  None. FINDINGS: The bowel gas pattern is normal. There is no evidence of free air. No radio-opaque calculi or other significant radiographic abnormality is seen. IMPRESSION: Negative. Electronically Signed   By: Kathreen Devoid   On: 04/11/2015 13:51         Subjective: Patient still complains of some nausea. She says her abdominal pain is improved without any more vomiting. She denies any fevers, chills, chest pain, shortness breath, hematochezia, melena. No hematemesis right now.  Objective: Filed Vitals:   04/21/15 1536 04/21/15 1946 04/22/15 0000 04/22/15 0500  BP: 124/68     Pulse:      Temp:  98.2 F (36.8 C) 98.7 F (37.1 C) 97.8 F (36.6 C)  TempSrc:   Oral Oral  Resp: 20     Height:      Weight:      SpO2: 100%       Intake/Output Summary (Last 24 hours) at 04/22/15 0647 Last data filed at 04/21/15 1800  Gross per 24 hour  Intake 1975.46 ml  Output   1900 ml  Net  75.46 ml   Weight change:  Exam:   General:  Pt is alert, follows commands appropriately, not in acute distress  HEENT: No icterus, No thrush, No neck mass, [place]/AT  Cardiovascular: RRR, S1/S2, no rubs, no gallops  Respiratory: CTA bilaterally, no wheezing, no crackles, no rhonchi  Abdomen: Soft/+BS, non tender, non distended, no guarding  Extremities: No edema, No lymphangitis, No petechiae, No  rashes, no synovitis  Data Reviewed: Basic Metabolic Panel:  Recent Labs Lab 04/21/15 1119 04/21/15  1510 04/21/15 1843 04/21/15 2254 04/22/15 0238  NA 133* 134* 136 134* 136  K 5.9* 4.8 4.0 3.8 3.7  CL 109 112* 116* 113* 116*  CO2 <7* 7* 11* 14* 13*  GLUCOSE 417* 213* 166* 160* 166*  BUN 17 15 12 12 12   CREATININE 1.02* 0.89 0.61 0.52 0.48  CALCIUM 8.6* 8.6* 8.3* 8.4* 8.3*   Liver Function Tests:  Recent Labs Lab 04/21/15 0054  AST 16  ALT 10*  ALKPHOS 83  BILITOT 1.2  PROT 7.6  ALBUMIN 4.4    Recent Labs Lab 04/21/15 0054  LIPASE 18   No results for input(s): AMMONIA in the last 168 hours. CBC:  Recent Labs Lab 04/21/15 0054 04/21/15 1119  WBC 9.4 28.2*  NEUTROABS  --  24.2*  HGB 14.9 14.2  HCT 44.2 42.9  MCV 91.5 94.3  PLT 395 471*   Cardiac Enzymes: No results for input(s): CKTOTAL, CKMB, CKMBINDEX, TROPONINI in the last 168 hours. BNP: Invalid input(s): POCBNP CBG:  Recent Labs Lab 04/21/15 1846 04/21/15 2005 04/21/15 2104 04/21/15 2207 04/22/15 0102  GLUCAP 156* 141* 129* 133* 157*    No results found for this or any previous visit (from the past 240 hour(s)).   Scheduled Meds: . enoxaparin (LOVENOX) injection  40 mg Subcutaneous Q24H  . insulin aspart  0-15 Units Subcutaneous TID WC  . insulin aspart  0-5 Units Subcutaneous QHS  . insulin aspart  3 Units Subcutaneous TID WC  . insulin glargine  24 Units Subcutaneous QHS  . mometasone-formoterol  2 puff Inhalation BID  . montelukast  10 mg Oral Daily  . pantoprazole (PROTONIX) IV  40 mg Intravenous Q12H   Continuous Infusions: . sodium chloride 100 mL/hr at 04/21/15 2257     Jeffery Bachmeier, DO  [facility] Pager [phone]  If 7PM-7AM, please contact night-coverage [email or web address] Password [email or web address] 04/22/2015, 6:47 AM   LOS: 1 day

## 2015-04-23 ENCOUNTER — Encounter (HOSPITAL_COMMUNITY): Payer: Self-pay

## 2015-04-23 ENCOUNTER — Emergency Department (HOSPITAL_COMMUNITY)
Admission: EM | Admit: 2015-04-23 | Discharge: 2015-04-23 | Disposition: A | Discharge status: Home / Self Care | Payer: BLUE CROSS/BLUE SHIELD | Attending: Emergency Medicine | Admitting: Emergency Medicine

## 2015-04-23 DIAGNOSIS — G8929 Other chronic pain: Secondary | ICD-10-CM | POA: Insufficient documentation

## 2015-04-23 DIAGNOSIS — R1013 Epigastric pain: Secondary | ICD-10-CM | POA: Insufficient documentation

## 2015-04-23 DIAGNOSIS — Z794 Long term (current) use of insulin: Secondary | ICD-10-CM | POA: Insufficient documentation

## 2015-04-23 DIAGNOSIS — Z8739 Personal history of other diseases of the musculoskeletal system and connective tissue: Secondary | ICD-10-CM | POA: Insufficient documentation

## 2015-04-23 DIAGNOSIS — Z3202 Encounter for pregnancy test, result negative: Secondary | ICD-10-CM | POA: Insufficient documentation

## 2015-04-23 DIAGNOSIS — E876 Hypokalemia: Secondary | ICD-10-CM

## 2015-04-23 DIAGNOSIS — J45909 Unspecified asthma, uncomplicated: Secondary | ICD-10-CM | POA: Insufficient documentation

## 2015-04-23 DIAGNOSIS — I10 Essential (primary) hypertension: Secondary | ICD-10-CM | POA: Insufficient documentation

## 2015-04-23 DIAGNOSIS — R011 Cardiac murmur, unspecified: Secondary | ICD-10-CM | POA: Insufficient documentation

## 2015-04-23 DIAGNOSIS — E109 Type 1 diabetes mellitus without complications: Secondary | ICD-10-CM | POA: Insufficient documentation

## 2015-04-23 DIAGNOSIS — R112 Nausea with vomiting, unspecified: Secondary | ICD-10-CM

## 2015-04-23 DIAGNOSIS — Z8659 Personal history of other mental and behavioral disorders: Secondary | ICD-10-CM | POA: Insufficient documentation

## 2015-04-23 DIAGNOSIS — K760 Fatty (change of) liver, not elsewhere classified: Secondary | ICD-10-CM

## 2015-04-23 DIAGNOSIS — Z8719 Personal history of other diseases of the digestive system: Secondary | ICD-10-CM | POA: Insufficient documentation

## 2015-04-23 DIAGNOSIS — Z79899 Other long term (current) drug therapy: Secondary | ICD-10-CM | POA: Insufficient documentation

## 2015-04-23 LAB — COMPREHENSIVE METABOLIC PANEL
ALT: 10 U/L — ABNORMAL LOW (ref 14–54)
AST: 18 U/L (ref 15–41)
Albumin: 3.7 g/dL (ref 3.5–5.0)
Alkaline Phosphatase: 75 U/L (ref 38–126)
Anion gap: 10 (ref 5–15)
BILIRUBIN TOTAL: 0.6 mg/dL (ref 0.3–1.2)
CO2: 25 mmol/L (ref 22–32)
CREATININE: 0.38 mg/dL — AB (ref 0.44–1.00)
Calcium: 8.8 mg/dL — ABNORMAL LOW (ref 8.9–10.3)
Chloride: 106 mmol/L (ref 101–111)
Glucose, Bld: 128 mg/dL — ABNORMAL HIGH (ref 65–99)
POTASSIUM: 3.2 mmol/L — AB (ref 3.5–5.1)
Sodium: 141 mmol/L (ref 135–145)
TOTAL PROTEIN: 6.5 g/dL (ref 6.5–8.1)

## 2015-04-23 LAB — CBC
HCT: 34.4 % — ABNORMAL LOW (ref 36.0–46.0)
Hemoglobin: 12.2 g/dL (ref 12.0–15.0)
MCH: 31.1 pg (ref 26.0–34.0)
MCHC: 35.5 g/dL (ref 30.0–36.0)
MCV: 87.8 fL (ref 78.0–100.0)
PLATELETS: 261 10*3/uL (ref 150–400)
RBC: 3.92 MIL/uL (ref 3.87–5.11)
RDW: 12.6 % (ref 11.5–15.5)
WBC: 4.2 10*3/uL (ref 4.0–10.5)

## 2015-04-23 LAB — URINALYSIS, ROUTINE W REFLEX MICROSCOPIC
BILIRUBIN URINE: NEGATIVE
GLUCOSE, UA: 100 mg/dL — AB
Hgb urine dipstick: NEGATIVE
KETONES UR: 15 mg/dL — AB
NITRITE: NEGATIVE
PH: 7 (ref 5.0–8.0)
Protein, ur: NEGATIVE mg/dL
Specific Gravity, Urine: 1.009 (ref 1.005–1.030)

## 2015-04-23 LAB — BASIC METABOLIC PANEL
ANION GAP: 6 (ref 5–15)
BUN: <5 mg/dL — ABNORMAL LOW (ref 6–20)
CALCIUM: 7.9 mg/dL — AB (ref 8.9–10.3)
CO2: 20 mmol/L — ABNORMAL LOW (ref 22–32)
CREATININE: 0.38 mg/dL — AB (ref 0.44–1.00)
Chloride: 115 mmol/L — ABNORMAL HIGH (ref 101–111)
GFR calc Af Amer: >60 mL/min (ref >60)
GLUCOSE: 91 mg/dL (ref 65–99)
Potassium: 3.3 mmol/L — ABNORMAL LOW (ref 3.5–5.1)
Sodium: 141 mmol/L (ref 135–145)

## 2015-04-23 LAB — I-STAT BETA HCG BLOOD, ED (MC, WL, AP ONLY)

## 2015-04-23 LAB — GLUCOSE, CAPILLARY
GLUCOSE-CAPILLARY: 90 mg/dL (ref 65–99)
Glucose-Capillary: 71 mg/dL (ref 65–99)
Glucose-Capillary: 72 mg/dL (ref 65–99)

## 2015-04-23 LAB — URINE MICROSCOPIC-ADD ON

## 2015-04-23 LAB — LIPASE, BLOOD: Lipase: 21 U/L (ref 11–51)

## 2015-04-23 MED ORDER — HALOPERIDOL LACTATE 5 MG/ML IJ SOLN
5.0000 mg | Freq: Once | INTRAMUSCULAR | Status: AC
  Administered 2015-04-23: 5 mg via INTRAVENOUS
  Filled 2015-04-23: qty 1

## 2015-04-23 MED ORDER — PANTOPRAZOLE SODIUM 40 MG IV SOLR
40.0000 mg | Freq: Once | INTRAVENOUS | Status: AC
  Administered 2015-04-23: 40 mg via INTRAVENOUS
  Filled 2015-04-23: qty 40

## 2015-04-23 MED ORDER — SODIUM CHLORIDE 0.9 % IV BOLUS (SEPSIS)
1000.0000 mL | Freq: Once | INTRAVENOUS | Status: AC
  Administered 2015-04-23: 1000 mL via INTRAVENOUS

## 2015-04-23 MED ORDER — POTASSIUM CHLORIDE CRYS ER 20 MEQ PO TBCR
20.0000 meq | EXTENDED_RELEASE_TABLET | Freq: Once | ORAL | Status: AC
  Administered 2015-04-23: 20 meq via ORAL
  Filled 2015-04-23: qty 1

## 2015-04-23 MED ORDER — ONDANSETRON HCL 4 MG/2ML IJ SOLN
4.0000 mg | Freq: Once | INTRAMUSCULAR | Status: DC
  Filled 2015-04-23: qty 2

## 2015-04-23 MED ORDER — ONDANSETRON HCL 4 MG PO TABS: 4.0000 mg | ORAL_TABLET | Freq: Four times a day (QID) | ORAL | Status: DC

## 2015-04-23 MED ORDER — METOCLOPRAMIDE HCL 5 MG/ML IJ SOLN
10.0000 mg | Freq: Once | INTRAMUSCULAR | Status: AC
  Administered 2015-04-23: 10 mg via INTRAVENOUS
  Filled 2015-04-23: qty 2

## 2015-04-23 MED ORDER — OMEPRAZOLE 40 MG PO CPDR: 40.0000 mg | DELAYED_RELEASE_CAPSULE | Freq: Every day | ORAL | Status: DC

## 2015-04-23 NOTE — Discharge Summary (Signed)
Physician Discharge Summary  [name] [id number] DOB: Jul 04, 1990 DOA: 04/20/2015  PCP: Vicenta Aly, FNP  Admit date: 04/20/2015 Discharge date: 04/23/2015  Recommendations for Outpatient Follow-up:  1. Pt will need to follow up with PCP in 2 weeks post discharge 2. Please obtain BMP and CBC in 1 week Discharge Diagnoses:  Diabetic ketoacidosis in type 1 DM -Continue insulin drip-->transition to [place] insulin -continue Lantus 24 units daily -continue sensitive SSI -Continue fluid resuscitation -04/13/2015 hemoglobin A1c 9.3  -03/22/2015 UDS positive for opiates Nausea and vomiting/Abdominal pain -likely gastroparesis related to her DM and DKA -04/21/2015 CT abdomen and pelvis--stable hepatic masses, normal pancreas, normal bowel wall thickening or inflammation, normal spleen and pancreas -diet advanced and pt tolerated Leukocytosis -Given the patient's low-grade fever, myalgias, arthralgias--check influenza--negative -?stress demargination -Urinalysis negative for pyuria -improved Hematemesis/upper GI bleed -Hemoglobin appears to be stable, likely secondary to forceful vomiting, 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 -start PPI--home with omeprazole 40 mg daily -Patient had same complaint on previous admission on 04/10/2015 -Although the patient has dilution drop in her hemoglobin, her hemoglobin overall remained stable when compared to her multiple admissions -Continue to trend hemoglobin--stable Asthma -No wheezing or stridor -Stable on room air -Continue LABA -continue singulair Sinus tachycardia -Secondary to stress response from the patient's volume depletion, vomiting, and pain -As discussed, aggressive fluid resuscitation and conservative measures usually results in an improvement -continue IVF-->improved -Echocardiogram 05/24/2014 showed EF Q000111Q, grade 1 diastolic dysfunction -123456 Elevated blood pressure -Likely situational without  diagnosis of hypertension -Overall, the preponderance of her blood pressures eventually normalizes -Do not plan to start anti-HTN regimen  Discharge Condition: stable  Disposition: home  Diet:carb modified Wt Readings from Last 3 Encounters:  04/23/15 50.984 kg (112 lb 6.4 oz)  04/15/15 51.574 kg (113 lb 11.2 oz)  03/22/15 52.9 kg (116 lb 10 oz)    History of present illness:  25 year old female with a history of diabetes mellitus type 1, recent pancreatitis, liver adenomas, anxiety, hepatic steatosis, asthma presented with 2-3 day history of nausea, vomiting, and diarrhea as well as generalized myalgias and arthralgias. The patient has had numerous admissions in the past 6 months secondary to DKA. Most recently, the patient was discharged after an admission from 03/21/2015 through 03/25/2015 and an admission from 04/08/2015 through 04/15/2015. During her admissions, it has been characteristic for slow recovery due to slow recovering metabolic acidosis which is initially gapped and subsequently nongapped. In addition, her admissions have been characterized by profound volume depletion requiring significant fluid resuscitation which subsequently resulted in improvement in the patient's sinus tachycardia and metabolic acidosis. Finally, her previous admissions have been characterized by opioid dependence for her "abdominal pain"which has resulted in exacerbation of her gastroparesis resulting in slow recovery of her nausea and vomiting. On this admission, the patient once again presented with a gapped metabolic acidosis with serum glucose of 517. The patient was started on an intravenous insulin drip and aggressive fluid resuscitation. Her anion gap has closed, although the patient continues to have a non-gapped metabolic acidosis. She has been transitioned to subcutaneous insulin.  Her diet was advanced and she tolerated it    Discharge Exam: Filed Vitals:   04/23/15 0225 04/23/15 0619  BP:  131/92 132/92  Pulse: 94 92  Temp: 98.7 F (37.1 C) 98.7 F (37.1 C)  Resp: 16 13   Filed Vitals:   04/22/15 1312 04/22/15 2120 04/23/15 0225 04/23/15 0619  BP: 125/82 128/88 131/92 132/92  Pulse: 102 93 94 92  Temp: 98.9  F (37.2 C) 98.3 F (36.8 C) 98.7 F (37.1 C) 98.7 F (37.1 C)  TempSrc: Oral Oral Oral Oral  Resp: 16 16 16 13   Height:      Weight:    50.984 kg (112 lb 6.4 oz)  SpO2: 100% 100% 96% 100%   General: A&O x 3, NAD, pleasant, cooperative Cardiovascular: RRR, no rub, no gallop, no S3 Respiratory: CTAB, no wheeze, no rhonchi Abdomen:soft, nontender, nondistended, positive bowel sounds Extremities: No edema, No lymphangitis, no petechiae  Discharge Instructions  Discharge Instructions    Diet Carb Modified    Complete by:  As directed      Increase activity slowly    Complete by:  As directed             Medication List    TAKE these medications        albuterol 108 (90 Base) MCG/ACT inhaler  Commonly known as:  PROVENTIL HFA;VENTOLIN HFA  Inhale 2 puffs into the lungs every 6 (six) hours as needed for shortness of breath.     albuterol (2.5 MG/3ML) 0.083% nebulizer solution  Commonly known as:  PROVENTIL  Take 2.5 mg by nebulization every 6 (six) hours as needed for shortness of breath.     budesonide-formoterol 160-4.5 MCG/ACT inhaler  Commonly known as:  SYMBICORT  Inhale 2 puffs into the lungs 2 (two) times daily as needed (SOB, wheezing).     EPIPEN 0.3 mg/0.3 mL Soaj injection  Generic drug:  EPINEPHrine  Inject 0.3 mg into the muscle once.     insulin aspart 100 UNIT/ML FlexPen  Commonly known as:  NOVOLOG FLEXPEN  INJECT 0-20 UNITS INTO THE SKIN AS DIRECTED. SSI - CARB SCALE, 0-20 UNITS     Insulin Glargine 100 UNIT/ML Solostar Pen  Commonly known as:  LANTUS SOLOSTAR  Inject 36 Units into the skin daily at 10 pm.     metoCLOPramide 10 MG tablet  Commonly known as:  REGLAN  Take 1 tablet (10 mg total) by mouth 4 (four) times  daily.     montelukast 10 MG tablet  Commonly known as:  SINGULAIR  Take 10 mg by mouth daily.     omeprazole 40 MG capsule  Commonly known as:  PRILOSEC  Take 1 capsule (40 mg total) by mouth daily.     ondansetron 4 MG tablet  Commonly known as:  ZOFRAN  Take 1 tablet (4 mg total) by mouth every 6 (six) hours as needed for nausea.         The results of significant diagnostics from this hospitalization (including imaging, microbiology, ancillary and laboratory) are listed below for reference.    Significant Diagnostic Studies: Ct Abdomen Pelvis W Contrast  04/21/2015  CLINICAL DATA:  Moderate generalized abdominal pain, loss of approximately 15 pounds over the last few weeks with several ER admissions. History of diabetes, hypertension, pancreatitis and hepatic steatosis presenting to the ER complaining of multiple episodes of moderate nonbloody vomiting onset several days ago. History of previous hematemesis. EXAM: CT ABDOMEN AND PELVIS WITH CONTRAST TECHNIQUE: Multidetector CT imaging of the abdomen and pelvis was performed using the standard protocol following bolus administration of intravenous contrast. CONTRAST:  155mL ISOVUE-300 IOPAMIDOL (ISOVUE-300) INJECTION 61% COMPARISON:  CT abdomen dated 11/25/2014. FINDINGS: Lower chest:  No acute findings. Hepatobiliary: Stable hypodense mass within the right liver lobe, segment 5, measures 2.6 x 1.7 cm. Additional small mass within the left liver lobe, segment 2, measures 1 cm greatest dimension. Two additional masses  were seen on the ultrasound of 12/18/2014. Liver again appears enlarged and low in density suggesting fatty infiltration. Patient is status post cholecystectomy. No bile duct dilatation. Pancreas: No mass, inflammatory changes, or other significant abnormality. Spleen: Within normal limits in size and appearance. Adrenals/Urinary Tract: No masses identified. No evidence of hydronephrosis. There is mild pelviectasis bilaterally  due to prominent bladder distension. Stomach/Bowel: Bowel is normal in caliber. No bowel wall thickening or convincing evidence of bowel wall inflammation seen. Appendix is not seen but there are no inflammatory changes about the cecum to suggest acute appendicitis. Stomach appears normal. Vascular/Lymphatic: No pathologically enlarged lymph nodes. No evidence of abdominal aortic aneurysm. Reproductive: No mass or other significant abnormality. Other: No free fluid or abscess collections seen. No free intraperitoneal air. Musculoskeletal: No acute or suspicious osseous lesion. Superficial soft tissues are unremarkable. IMPRESSION: 1. Two liver lesions, largest within the right hepatic lobe (bordering segments 5 and 6) is stable in size measuring 2.6 x 1.7 cm. 1 cm lesion within the left hepatic lobe (segment 2), was not visible on the earlier CT but was seen on an earlier ultrasound and MRI. Four separate liver lesions were identified on the abdomen ultrasound of 12/18/2014 and on earlier MRI of 11/27/2014. On the MRI report, these were favored to represent benign hepatic adenomas. Six-month follow-up liver MRI was recommended on that report which would be due in May of 2017. 2. Hepatomegaly with probable fatty infiltration, as also described on previous reports. 3. Bladder distension, at least moderate in degree. Bladder otherwise unremarkable. 4. Remainder of the abdomen and pelvis CT is unremarkable, as detailed above. Electronically Signed   By: Franki Cabot M.D.   On: 04/21/2015 12:18   Dg Abd 2 Views  04/11/2015  CLINICAL DATA:  Intractable nausea and vomiting, abdominal pain EXAM: ABDOMEN - 2 VIEW COMPARISON:  None. FINDINGS: The bowel gas pattern is normal. There is no evidence of free air. No radio-opaque calculi or other significant radiographic abnormality is seen. IMPRESSION: Negative. Electronically Signed   By: Kathreen Devoid   On: 04/11/2015 13:51     Microbiology: No results found for this or  any previous visit (from the past 240 hour(s)).   Labs: Basic Metabolic Panel:  Recent Labs Lab 04/21/15 1843 04/21/15 2254 04/22/15 0238 04/22/15 0751 04/23/15 0335  NA 136 134* 136 137 141  K 4.0 3.8 3.7 3.5 3.3*  CL 116* 113* 116* 115* 115*  CO2 11* 14* 13* 15* 20*  GLUCOSE 166* 160* 166* 156* 91  BUN 12 12 12 11  <5*  CREATININE 0.61 0.52 0.48 0.48 0.38*  CALCIUM 8.3* 8.4* 8.3* 8.3* 7.9*   Liver Function Tests:  Recent Labs Lab 04/21/15 0054  AST 16  ALT 10*  ALKPHOS 83  BILITOT 1.2  PROT 7.6  ALBUMIN 4.4    Recent Labs Lab 04/21/15 0054  LIPASE 18   No results for input(s): AMMONIA in the last 168 hours. CBC:  Recent Labs Lab 04/21/15 0054 04/21/15 1119 04/22/15 0751  WBC 9.4 28.2* 9.8  NEUTROABS  --  24.2*  --   HGB 14.9 14.2 11.8*  HCT 44.2 42.9 34.4*  MCV 91.5 94.3 88.9  PLT 395 471* 298   Cardiac Enzymes: No results for input(s): CKTOTAL, CKMB, CKMBINDEX, TROPONINI in the last 168 hours. BNP: Invalid input(s): POCBNP CBG:  Recent Labs Lab 04/22/15 1627 04/22/15 2122 04/23/15 0722 04/23/15 0804 04/23/15 0847  GLUCAP 254* 150* 71 72 90    Time coordinating discharge:  Greater than 30 minutes  Signed:  Analayah Brooke, DO [facility] Pager: [phone] 04/23/2015, 10:47 AM

## 2015-04-23 NOTE — ED Provider Notes (Signed)
CSN: [id number]     Arrival date & time 04/23/15  1733 History   First MD Initiated Contact with Patient 04/23/15 1748     Chief Complaint  Patient presents with  . Abdominal Pain  . Emesis   HPI [name] is a 25 y.o. female PMH significant for DM I, pancreatitis, HTN, hepatic steatosis presenting with a chronic history of epigastric pain and nausea/vomiting. She was recently admitted to [facility] 04/20/15 for DKA, and was discharged today. She states since her discharge, she has vomited repeatedly. She describes her abdominal pain as 10/10 pain scale, chronic, intermittent, stabbing, exactly like her previous gastroparesis exacerbations. She endorses compliance with her home insulin, and that her glucose usually ranges in the low 100s-120s. She denies fevers, chills, CP, SOB, back pain, cough, changes in bowel/bladder habits.   Past Medical History  Diagnosis Date  . Diabetes mellitus   . Asthma   . Pancreatitis, acute 11/26/2014  . Hepatic steatosis 11/26/2014  . Liver mass 11/26/2014  . Hypertension     NOT CURRENTLY ON ANY BP MED  . Heart murmur   . Arthritis   . Gallstones   . Anxiety    Past Surgical History  Procedure Laterality Date  . Wisdom tooth extraction    . Esophagogastroduodenoscopy (egd) with propofol Left 09/20/2014    Procedure: ESOPHAGOGASTRODUODENOSCOPY (EGD) WITH PROPOFOL;  Surgeon: Arta Silence, MD;  Location: [facility] END[facility]COPY;  Service: Endoscopy;  Laterality: Left;  . Cholecystectomy N/A 02/11/2015    Procedure: LAPAROSCOPIC CHOLECYSTECTOMY WITH INTRAOPERATIVE CHOLANGIOGRAM;  Surgeon: Greer Pickerel, MD;  Location: [facility] ORS;  Service: General;  Laterality: N/A;   Family History  Problem Relation Age of Onset  . Diabetes Mother   . Hyperlipidemia Mother   . Hypertension Father   . Heart disease Father   . Heart disease Maternal Grandmother   . Heart disease Maternal Grandfather   . Hypertension Paternal Grandmother   . Cancer Paternal Grandfather     Social History  Substance Use Topics  . Smoking status: Never Smoker   . Smokeless tobacco: Never Used  . Alcohol Use: No   OB History    Gravida Para Term Preterm AB TAB SAB Ectopic Multiple Living   2 1 0 1 1 1 0 0 0 1      Review of Systems  Ten systems are reviewed and are negative for acute change except as noted in the HPI  Allergies  Peanut-containing drug products and Strawberry extract  Home Medications   Prior to Admission medications   Medication Sig Start Date End Date Taking? Authorizing Provider  insulin aspart (NOVOLOG FLEXPEN) 100 UNIT/ML FlexPen INJECT 0-20 UNITS INTO THE SKIN AS DIRECTED. SSI - CARB SCALE, 0-20 UNITS Patient taking differently: Inject into the skin 4 (four) times daily as needed for high blood sugar. Sliding scale 10/19/13  Yes Elayne Snare, MD  Insulin Glargine (LANTUS SOLOSTAR) 100 UNIT/ML Solostar Pen Inject 36 Units into the skin daily at 10 pm. Patient taking differently: Inject 24 Units into the skin daily at 10 pm.  10/19/13  Yes Elayne Snare, MD  metoCLOPramide (REGLAN) 10 MG tablet Take 1 tablet (10 mg total) by mouth 4 (four) times daily. 04/15/15  Yes [name], DO  montelukast (SINGULAIR) 10 MG tablet Take 10 mg by mouth daily.   Yes Historical Provider, MD  omeprazole (PRILOSEC) 40 MG capsule Take 1 capsule (40 mg total) by mouth daily. 04/23/15  Yes Orson Eva, MD  ondansetron (ZOFRAN) 4 MG tablet  Take 1 tablet (4 mg total) by mouth every 6 (six) hours as needed for nausea. 04/15/15  Yes [name], DO  albuterol (PROVENTIL HFA;VENTOLIN HFA) 108 (90 BASE) MCG/ACT inhaler Inhale 2 puffs into the lungs every 6 (six) hours as needed for shortness of breath.    Historical Provider, MD  albuterol (PROVENTIL) (2.5 MG/3ML) 0.083% nebulizer solution Take 2.5 mg by nebulization every 6 (six) hours as needed for shortness of breath.    Historical Provider, MD  budesonide-formoterol (SYMBICORT) 160-4.5 MCG/ACT inhaler Inhale 2 puffs into the  lungs 2 (two) times daily as needed (SOB, wheezing).     Historical Provider, MD  EPINEPHrine (EPIPEN) 0.3 mg/0.3 mL SOAJ injection Inject 0.3 mg into the muscle once.    Historical Provider, MD   BP 139/98 mmHg  Pulse 94  Temp(Src) 98.4 F (36.9 C) (Oral)  Resp 18  Ht 5\' 4"  (1.626 m)  Wt 49.442 kg  BMI 18.70 kg/m2  SpO2 100%  LMP 04/09/2015 Physical Exam  Constitutional: She appears well-developed and well-nourished. No distress.  HENT:  Head: Normocephalic and atraumatic.  Mouth/Throat: Oropharynx is clear and moist. No oropharyngeal exudate.  Eyes: Conjunctivae are normal. Pupils are equal, round, and reactive to light. Right eye exhibits no discharge. Left eye exhibits no discharge. No scleral icterus.  Neck: No tracheal deviation present.  Cardiovascular: Normal rate, regular rhythm, normal heart sounds and intact distal pulses.  Exam reveals no gallop and no friction rub.   No murmur heard. Pulmonary/Chest: Effort normal and breath sounds normal. No respiratory distress. She has no wheezes. She has no rales. She exhibits no tenderness.  Abdominal: Soft. Bowel sounds are normal. She exhibits no distension and no mass. There is tenderness. There is no rebound and no guarding.  Mild epigastric tenderness  Musculoskeletal: She exhibits no edema.  Lymphadenopathy:    She has no cervical adenopathy.  Neurological: She is alert. Coordination normal.  Skin: Skin is warm and dry. No rash noted. She is not diaphoretic. No erythema.  Psychiatric: She has a normal mood and affect. Her behavior is normal.  Nursing note and vitals reviewed.   ED Course  Procedures  Labs Review Labs Reviewed  CBC - Abnormal; Notable for the following:    HCT 34.4 (*)    All other components within normal limits  COMPREHENSIVE METABOLIC PANEL - Abnormal; Notable for the following:    Potassium 3.2 (*)    Glucose, Bld 128 (*)    BUN <5 (*)    Creatinine, Ser 0.38 (*)    Calcium 8.8 (*)    ALT 10  (*)    All other components within normal limits  LIPASE, BLOOD  URINALYSIS, ROUTINE W REFLEX MICROSCOPIC (NOT AT [facility])  I-STAT BETA HCG BLOOD, ED ([facility], [facility], [facility] ONLY)   MDM   Final diagnoses:  Epigastric pain  Nausea and vomiting, vomiting of unspecified type   Patient nontoxic appearing, VSS. Based on patient history and physical exam, most likely etiologies are GERD vs gastroparesis. Less likely etiologies include pancreatitis, MI.  CMP with hyperglycemia of 128, no anion gap.  UA with glucosuria of 100, 15 ketones. Lipase, CBC, hcg unremarkable.  It has been documented previously that patient has an opioid dependence, so patient will be given haldol instead for her gastroparesis Upon reassessment, patient feeling improved after antiemetics and haldol. Patient is requesting to leave. Patient may be safely discharged home. Discussed reasons for return. Patient to follow-up with primary care provider within one week.  Patient in understanding and agreement with the plan. Case discussed with Dr. Billy Fischer who agrees with above plan.  [place]ford Lions, PA-C 04/28/15 1619  Gareth Morgan, MD 04/29/15 [phone]

## 2015-04-23 NOTE — Discharge Instructions (Signed)
[name],  Nice meeting you! Please follow-up with your primary care provider. Return to the emergency department if you develop increased abdominal pain, inability to keep foods down. Feel better soon!  S. Wendie Simmer, PA-C  Abdominal Pain, Adult Many things can cause abdominal pain. Usually, abdominal pain is not caused by a disease and will improve without treatment. It can often be observed and treated at home. Your health care provider will do a physical exam and possibly order blood tests and X-rays to help determine the seriousness of your pain. However, in many cases, more time must pass before a clear cause of the pain can be found. Before that point, your health care provider may not know if you need more testing or further treatment. HOME CARE INSTRUCTIONS Monitor your abdominal pain for any changes. The following actions may help to alleviate any discomfort you are experiencing:  Only take over-the-counter or prescription medicines as directed by your health care provider.  Do not take laxatives unless directed to do so by your health care provider.  Try a clear liquid diet (broth, tea, or water) as directed by your health care provider. Slowly move to a bland diet as tolerated. SEEK MEDICAL CARE IF:  You have unexplained abdominal pain.  You have abdominal pain associated with nausea or diarrhea.  You have pain when you urinate or have a bowel movement.  You experience abdominal pain that wakes you in the night.  You have abdominal pain that is worsened or improved by eating food.  You have abdominal pain that is worsened with eating fatty foods.  You have a fever. SEEK IMMEDIATE MEDICAL CARE IF:  Your pain does not go away within 2 hours.  You keep throwing up (vomiting).  Your pain is felt only in portions of the abdomen, such as the right side or the left lower portion of the abdomen.  You pass bloody or black tarry stools. MAKE SURE  YOU:  Understand these instructions.  Will watch your condition.  Will get help right away if you are not doing well or get worse.   This information is not intended to replace advice given to you by your health care provider. Make sure you discuss any questions you have with your health care provider.   Document Released: 10/15/2004 Document Revised: 09/26/2014 Document Reviewed: 09/14/2012 Elsevier Interactive Patient Education [employer].

## 2015-04-23 NOTE — ED Notes (Signed)
Patient states she has been seen several times for gastroparesis. Patient is vomiting in triage.

## 2015-05-24 ENCOUNTER — Encounter (HOSPITAL_COMMUNITY): Payer: Self-pay | Admitting: Emergency Medicine

## 2015-05-24 ENCOUNTER — Observation Stay (HOSPITAL_COMMUNITY)
Admission: EM | Admit: 2015-05-24 | Discharge: 2015-05-25 | Disposition: A | Discharge status: Home / Self Care | Payer: BLUE CROSS/BLUE SHIELD | Attending: Internal Medicine | Admitting: Internal Medicine

## 2015-05-24 DIAGNOSIS — R000 Tachycardia, unspecified: Secondary | ICD-10-CM | POA: Diagnosis not present

## 2015-05-24 DIAGNOSIS — IMO0002 Reserved for concepts with insufficient information to code with codable children: Secondary | ICD-10-CM | POA: Diagnosis present

## 2015-05-24 DIAGNOSIS — Z79899 Other long term (current) drug therapy: Secondary | ICD-10-CM | POA: Insufficient documentation

## 2015-05-24 DIAGNOSIS — R1110 Vomiting, unspecified: Secondary | ICD-10-CM | POA: Diagnosis not present

## 2015-05-24 DIAGNOSIS — E1143 Type 2 diabetes mellitus with diabetic autonomic (poly)neuropathy: Secondary | ICD-10-CM | POA: Diagnosis not present

## 2015-05-24 DIAGNOSIS — E860 Dehydration: Secondary | ICD-10-CM | POA: Diagnosis not present

## 2015-05-24 DIAGNOSIS — E1043 Type 1 diabetes mellitus with diabetic autonomic (poly)neuropathy: Principal | ICD-10-CM | POA: Insufficient documentation

## 2015-05-24 DIAGNOSIS — R109 Unspecified abdominal pain: Secondary | ICD-10-CM | POA: Diagnosis present

## 2015-05-24 DIAGNOSIS — Z794 Long term (current) use of insulin: Secondary | ICD-10-CM | POA: Insufficient documentation

## 2015-05-24 DIAGNOSIS — E1065 Type 1 diabetes mellitus with hyperglycemia: Secondary | ICD-10-CM | POA: Diagnosis present

## 2015-05-24 DIAGNOSIS — E103219 Type 1 diabetes mellitus with mild nonproliferative diabetic retinopathy with macular edema, unspecified eye: Secondary | ICD-10-CM

## 2015-05-24 DIAGNOSIS — R1013 Epigastric pain: Secondary | ICD-10-CM | POA: Diagnosis not present

## 2015-05-24 DIAGNOSIS — I10 Essential (primary) hypertension: Secondary | ICD-10-CM | POA: Diagnosis present

## 2015-05-24 DIAGNOSIS — Z9049 Acquired absence of other specified parts of digestive tract: Secondary | ICD-10-CM | POA: Insufficient documentation

## 2015-05-24 DIAGNOSIS — R112 Nausea with vomiting, unspecified: Secondary | ICD-10-CM | POA: Diagnosis present

## 2015-05-24 DIAGNOSIS — J45909 Unspecified asthma, uncomplicated: Secondary | ICD-10-CM | POA: Insufficient documentation

## 2015-05-24 DIAGNOSIS — K3184 Gastroparesis: Secondary | ICD-10-CM | POA: Insufficient documentation

## 2015-05-24 LAB — URINALYSIS, ROUTINE W REFLEX MICROSCOPIC
Bilirubin Urine: NEGATIVE
Glucose, UA: >1000 mg/dL — AB
Hgb urine dipstick: NEGATIVE
Ketones, ur: >80 mg/dL — AB
Leukocytes, UA: NEGATIVE
Nitrite: NEGATIVE
Protein, ur: NEGATIVE mg/dL
Specific Gravity, Urine: 1.031 — ABNORMAL HIGH (ref 1.005–1.030)
pH: 5.5 (ref 5.0–8.0)

## 2015-05-24 LAB — COMPREHENSIVE METABOLIC PANEL
ALK PHOS: 121 U/L (ref 38–126)
ALT: 19 U/L (ref 14–54)
ANION GAP: 18 — AB (ref 5–15)
AST: 35 U/L (ref 15–41)
Albumin: 5.5 g/dL — ABNORMAL HIGH (ref 3.5–5.0)
BILIRUBIN TOTAL: 1 mg/dL (ref 0.3–1.2)
BUN: 19 mg/dL (ref 6–20)
CALCIUM: 10.9 mg/dL — AB (ref 8.9–10.3)
CO2: 22 mmol/L (ref 22–32)
Chloride: 105 mmol/L (ref 101–111)
Creatinine, Ser: 0.84 mg/dL (ref 0.44–1.00)
Glucose, Bld: 100 mg/dL — ABNORMAL HIGH (ref 65–99)
POTASSIUM: 3.6 mmol/L (ref 3.5–5.1)
Sodium: 145 mmol/L (ref 135–145)
TOTAL PROTEIN: 9.6 g/dL — AB (ref 6.5–8.1)

## 2015-05-24 LAB — CBC
HEMATOCRIT: 47.7 % — AB (ref 36.0–46.0)
HEMOGLOBIN: 16.5 g/dL — AB (ref 12.0–15.0)
MCH: 31.4 pg (ref 26.0–34.0)
MCHC: 34.6 g/dL (ref 30.0–36.0)
MCV: 90.9 fL (ref 78.0–100.0)
Platelets: 529 10*3/uL — ABNORMAL HIGH (ref 150–400)
RBC: 5.25 MIL/uL — ABNORMAL HIGH (ref 3.87–5.11)
RDW: 12.7 % (ref 11.5–15.5)
WBC: 11.9 10*3/uL — AB (ref 4.0–10.5)

## 2015-05-24 LAB — URINE MICROSCOPIC-ADD ON

## 2015-05-24 LAB — I-STAT BETA HCG BLOOD, ED (MC, WL, AP ONLY): I-stat hCG, quantitative: <5 m[IU]/mL (ref <5)

## 2015-05-24 LAB — LIPASE, BLOOD: Lipase: 16 U/L (ref 11–51)

## 2015-05-24 MED ORDER — ONDANSETRON HCL 4 MG/2ML IJ SOLN
4.0000 mg | Freq: Four times a day (QID) | INTRAMUSCULAR | Status: DC | PRN
  Administered 2015-05-25: 4 mg via INTRAVENOUS
  Filled 2015-05-24: qty 2

## 2015-05-24 MED ORDER — INSULIN ASPART 100 UNIT/ML ~~LOC~~ SOLN
0.0000 [IU] | SUBCUTANEOUS | Status: DC
  Administered 2015-05-25: 9 [IU] via SUBCUTANEOUS
  Administered 2015-05-25: 2 [IU] via SUBCUTANEOUS

## 2015-05-24 MED ORDER — HYDROMORPHONE HCL 1 MG/ML IJ SOLN
0.5000 mg | INTRAMUSCULAR | Status: DC | PRN
Start: 2015-05-24 — End: 2015-05-25
  Administered 2015-05-25: 1 mg via INTRAVENOUS
  Filled 2015-05-24: qty 1

## 2015-05-24 MED ORDER — SODIUM CHLORIDE 0.9 % IV SOLN: INTRAVENOUS | Status: DC

## 2015-05-24 MED ORDER — METOCLOPRAMIDE HCL 5 MG/ML IJ SOLN
10.0000 mg | Freq: Four times a day (QID) | INTRAMUSCULAR | Status: DC
  Administered 2015-05-25 (×3): 10 mg via INTRAVENOUS
  Filled 2015-05-24 (×3): qty 2

## 2015-05-24 MED ORDER — SODIUM CHLORIDE 0.9% FLUSH: 3.0000 mL | Freq: Two times a day (BID) | INTRAVENOUS | Status: DC

## 2015-05-24 MED ORDER — SODIUM CHLORIDE 0.9 % IV BOLUS (SEPSIS)
1000.0000 mL | Freq: Once | INTRAVENOUS | Status: AC
  Administered 2015-05-24: 1000 mL via INTRAVENOUS

## 2015-05-24 MED ORDER — SODIUM CHLORIDE 0.9 % IV SOLN
INTRAVENOUS | Status: DC
  Administered 2015-05-25 (×2): via INTRAVENOUS

## 2015-05-24 MED ORDER — PROCHLORPERAZINE EDISYLATE 5 MG/ML IJ SOLN
10.0000 mg | Freq: Once | INTRAMUSCULAR | Status: AC
  Administered 2015-05-24: 10 mg via INTRAVENOUS
  Filled 2015-05-24: qty 2

## 2015-05-24 MED ORDER — HYDROMORPHONE HCL 1 MG/ML IJ SOLN
1.0000 mg | Freq: Once | INTRAMUSCULAR | Status: AC
Start: 2015-05-24 — End: 2015-05-24
  Administered 2015-05-24: 1 mg via INTRAVENOUS
  Filled 2015-05-24: qty 1

## 2015-05-24 MED ORDER — ACETAMINOPHEN 325 MG PO TABS: 650.0000 mg | ORAL_TABLET | Freq: Four times a day (QID) | ORAL | Status: DC | PRN

## 2015-05-24 MED ORDER — INSULIN GLARGINE 100 UNIT/ML ~~LOC~~ SOLN
24.0000 [IU] | Freq: Every day | SUBCUTANEOUS | Status: DC
  Administered 2015-05-25: 24 [IU] via SUBCUTANEOUS
  Filled 2015-05-24 (×2): qty 0.24

## 2015-05-24 MED ORDER — ONDANSETRON HCL 4 MG PO TABS: 4.0000 mg | ORAL_TABLET | Freq: Four times a day (QID) | ORAL | Status: DC | PRN

## 2015-05-24 MED ORDER — ENOXAPARIN SODIUM 40 MG/0.4ML ~~LOC~~ SOLN
40.0000 mg | SUBCUTANEOUS | Status: DC
  Administered 2015-05-25: 40 mg via SUBCUTANEOUS
  Filled 2015-05-24: qty 0.4

## 2015-05-24 MED ORDER — ACETAMINOPHEN 650 MG RE SUPP: 650.0000 mg | Freq: Four times a day (QID) | RECTAL | Status: DC | PRN

## 2015-05-24 MED ORDER — SODIUM CHLORIDE 0.9 % IV BOLUS (SEPSIS)
2000.0000 mL | Freq: Once | INTRAVENOUS | Status: AC
  Administered 2015-05-24 (×2): 1000 mL via INTRAVENOUS

## 2015-05-24 MED ORDER — ONDANSETRON HCL 4 MG/2ML IJ SOLN
4.0000 mg | Freq: Once | INTRAMUSCULAR | Status: AC | PRN
  Administered 2015-05-24: 4 mg via INTRAVENOUS
  Filled 2015-05-24: qty 2

## 2015-05-24 MED ORDER — HYDROMORPHONE HCL 1 MG/ML IJ SOLN
1.0000 mg | Freq: Once | INTRAMUSCULAR | Status: AC
  Administered 2015-05-24: 1 mg via INTRAVENOUS
  Filled 2015-05-24: qty 1

## 2015-05-24 NOTE — ED Notes (Signed)
Pt offered PO fluids 

## 2015-05-24 NOTE — ED Provider Notes (Signed)
CSN: [id number]     Arrival date & time 05/24/15  1646 History   First MD Initiated Contact with Patient 05/24/15 1657     Chief Complaint  Patient presents with  . Nausea  . Emesis    hx gastroparesis      (Consider location/radiation/quality/duration/timing/severity/associated sxs/prior Treatment) HPI Complains of diffuse abdominal pain typical of diabetic gastroparesis, he by multiple episodes of vomiting onset 3 days ago. Last bowel movement yesterday normal. Last normal menstrual period 1 week ago. No vaginal discharge. Treated with Reglan and with Zofran, without relief. Nothing makes symptoms better or worse. No other associated symptoms pain is constant and severe and diffuse Past Medical History  Diagnosis Date  . Diabetes mellitus   . Asthma   . Pancreatitis, acute 11/26/2014  . Hepatic steatosis 11/26/2014  . Liver mass 11/26/2014  . Hypertension     NOT CURRENTLY ON ANY BP MED  . Heart murmur   . Arthritis   . Gallstones   . Anxiety    Past Surgical History  Procedure Laterality Date  . Wisdom tooth extraction    . Esophagogastroduodenoscopy (egd) with propofol Left 09/20/2014    Procedure: ESOPHAGOGASTRODUODENOSCOPY (EGD) WITH PROPOFOL;  Surgeon: Arta Silence, MD;  Location: [facility] ENDOSCOPY;  Service: Endoscopy;  Laterality: Left;  . Cholecystectomy N/A 02/11/2015    Procedure: LAPAROSCOPIC CHOLECYSTECTOMY WITH INTRAOPERATIVE CHOLANGIOGRAM;  Surgeon: Greer Pickerel, MD;  Location: [facility] ORS;  Service: General;  Laterality: N/A;   Family History  Problem Relation Age of Onset  . Diabetes Mother   . Hyperlipidemia Mother   . Hypertension Father   . Heart disease Father   . Heart disease Maternal Grandmother   . Heart disease Maternal Grandfather   . Hypertension Paternal Grandmother   . Cancer Paternal Grandfather    Social History  Substance Use Topics  . Smoking status: Never Smoker   . Smokeless tobacco: Never Used  . Alcohol Use: No   OB History    Gravida Para  Term Preterm AB TAB SAB Ectopic Multiple Living   2 1 0 1 1 1 0 0 0 1      Review of Systems  Constitutional: Negative.   HENT: Negative.   Respiratory: Negative.   Cardiovascular: Negative.   Gastrointestinal: Positive for nausea, vomiting and abdominal pain.  Musculoskeletal: Negative.   Skin: Negative.   Allergic/Immunologic: Positive for immunocompromised state.       Diabetic  Neurological: Negative.   Psychiatric/Behavioral: Negative.   All other systems reviewed and are negative.     Allergies  Peanut-containing drug products and Strawberry extract  Home Medications   Prior to Admission medications   Medication Sig Start Date End Date Taking? Authorizing Provider  albuterol (PROVENTIL HFA;VENTOLIN HFA) 108 (90 BASE) MCG/ACT inhaler Inhale 2 puffs into the lungs every 6 (six) hours as needed for shortness of breath.    Historical Provider, MD  albuterol (PROVENTIL) (2.5 MG/3ML) 0.083% nebulizer solution Take 2.5 mg by nebulization every 6 (six) hours as needed for shortness of breath.    Historical Provider, MD  budesonide-formoterol (SYMBICORT) 160-4.5 MCG/ACT inhaler Inhale 2 puffs into the lungs 2 (two) times daily as needed (SOB, wheezing).     Historical Provider, MD  EPINEPHrine (EPIPEN) 0.3 mg/0.3 mL SOAJ injection Inject 0.3 mg into the muscle once.    Historical Provider, MD  insulin aspart (NOVOLOG FLEXPEN) 100 UNIT/ML FlexPen INJECT 0-20 UNITS INTO THE SKIN AS DIRECTED. SSI - CARB SCALE, 0-20 UNITS Patient taking differently: Inject into  the skin 4 (four) times daily as needed for high blood sugar. Sliding scale 10/19/13   Elayne Snare, MD  Insulin Glargine (LANTUS SOLOSTAR) 100 UNIT/ML Solostar Pen Inject 36 Units into the skin daily at 10 pm. Patient taking differently: Inject 24 Units into the skin daily at 10 pm.  10/19/13   Elayne Snare, MD  metoCLOPramide (REGLAN) 10 MG tablet Take 1 tablet (10 mg total) by mouth 4 (four) times daily. 04/15/15   [name], DO   montelukast (SINGULAIR) 10 MG tablet Take 10 mg by mouth daily.    Historical Provider, MD  omeprazole (PRILOSEC) 40 MG capsule Take 1 capsule (40 mg total) by mouth daily. 04/23/15   Orson Eva, MD  ondansetron (ZOFRAN) 4 MG tablet Take 1 tablet (4 mg total) by mouth every 6 (six) hours as needed for nausea. 04/15/15   [name], DO  ondansetron (ZOFRAN) 4 MG tablet Take 1 tablet (4 mg total) by mouth every 6 (six) hours. 04/23/15   [place] Lions, PA-C   SpO2 99%  LMP 05/13/2015 Physical Exam  Constitutional: She appears well-developed and well-nourished. She appears distressed.  Appears uncomfortable, writhing on the bed  HENT:  Head: Normocephalic and atraumatic.  Mucous membranes dry  Eyes: Conjunctivae are normal. Pupils are equal, round, and reactive to light.  Neck: Neck supple. No tracheal deviation present. No thyromegaly present.  Cardiovascular: Regular rhythm.   No murmur heard. Tachycardic  Pulmonary/Chest: Effort normal and breath sounds normal.  Abdominal: Soft. She exhibits no distension. There is tenderness.  Diminished bowel sounds. Diffusely tender  Musculoskeletal: Normal range of motion. She exhibits no edema or tenderness.  Neurological: She is alert. Coordination normal.  Skin: Skin is warm and dry. No rash noted.  Psychiatric: She has a normal mood and affect.  Nursing note and vitals reviewed.   ED Course  Procedures (including critical care time) Labs Review Labs Reviewed  LIPASE, BLOOD  COMPREHENSIVE METABOLIC PANEL  CBC  URINALYSIS, ROUTINE W REFLEX MICROSCOPIC (NOT AT [facility])    Imaging Review No results found. I have personally reviewed and evaluated these images and lab results as part of my medical decision-making.   EKG Interpretation None     7:55 PM feels much improved after treatment with intravenous fluids, antiemetics and opioid pain medication and remains tachycardic. Additional IV fluids ordered.  10:45 PM patient is  alert , feels improved but she states the pain is recurring. Nauseated presently under control. She is  lightheaded on standing after 3 L of intravenous fluids and remains mildly tachycardic. Additional intravenous pain medicine and intravenous fluids ordered here patient does not feel well enough to go home at this point Results for orders placed or performed during the hospital encounter of 05/24/15  Lipase, blood  Result Value Ref Range   Lipase 16 11 - 51 U/L  Comprehensive metabolic panel  Result Value Ref Range   Sodium 145 135 - 145 mmol/L   Potassium 3.6 3.5 - 5.1 mmol/L   Chloride 105 101 - 111 mmol/L   CO2 22 22 - 32 mmol/L   Glucose, Bld 100 (H) 65 - 99 mg/dL   BUN 19 6 - 20 mg/dL   Creatinine, Ser 0.84 0.44 - 1.00 mg/dL   Calcium 10.9 (H) 8.9 - 10.3 mg/dL   Total Protein 9.6 (H) 6.5 - 8.1 g/dL   Albumin 5.5 (H) 3.5 - 5.0 g/dL   AST 35 15 - 41 U/L   ALT 19 14 - 54  U/L   Alkaline Phosphatase 121 38 - 126 U/L   Total Bilirubin 1.0 0.3 - 1.2 mg/dL   GFR calc non Af Amer >60 >60 mL/min   GFR calc Af Amer >60 >60 mL/min   Anion gap 18 (H) 5 - 15  CBC  Result Value Ref Range   WBC 11.9 (H) 4.0 - 10.5 K/uL   RBC 5.25 (H) 3.87 - 5.11 MIL/uL   Hemoglobin 16.5 (H) 12.0 - 15.0 g/dL   HCT 47.7 (H) 36.0 - 46.0 %   MCV 90.9 78.0 - 100.0 fL   MCH 31.4 26.0 - 34.0 pg   MCHC 34.6 30.0 - 36.0 g/dL   RDW 12.7 11.5 - 15.5 %   Platelets 529 (H) 150 - 400 K/uL  Urinalysis, Routine w reflex microscopic  Result Value Ref Range   Color, Urine YELLOW YELLOW   APPearance CLOUDY (A) CLEAR   Specific Gravity, Urine 1.031 (H) 1.005 - 1.030   pH 5.5 5.0 - 8.0   Glucose, UA >1000 (A) NEGATIVE mg/dL   Hgb urine dipstick NEGATIVE NEGATIVE   Bilirubin Urine NEGATIVE NEGATIVE   Ketones, ur >80 (A) NEGATIVE mg/dL   Protein, ur NEGATIVE NEGATIVE mg/dL   Nitrite NEGATIVE NEGATIVE   Leukocytes, UA NEGATIVE NEGATIVE  Urine microscopic-add on  Result Value Ref Range   Squamous Epithelial / LPF  6-30 (A) NONE SEEN   WBC, UA 0-5 0 - 5 WBC/hpf   RBC / HPF 0-5 0 - 5 RBC/hpf   Bacteria, UA MANY (A) NONE SEEN   Urine-Other MUCOUS PRESENT   I-Stat Beta hCG blood, ED ([facility], [facility], [facility] only)  Result Value Ref Range   I-stat hCG, quantitative <5.0 <5 mIU/mL   Comment 3           No results found.  MDM  Dr. Arnoldo Morale from hospitalist service consulted and will see patient in the hospital plan 23 hour observation, telemetry, intravenous hydration. Clear liquid diet. Pain control and nausea control Final diagnoses:  None  Symptoms felt secondary to diabetic gastroparesis Diagnosis #1 abdominal pain #2 nausea and vomiting      Orlie Dakin, MD 05/24/15 2315

## 2015-05-24 NOTE — H&P (Signed)
[facility] Admission History and Physical       [name] [id number] DOB: 24-Jun-1990 DOA: 05/24/2015  Referring physician: EDP PCP: Vicenta Aly, FNP  Specialists:   Chief Complaint: Nausea and Vomiting   HPI: [name] is a 25 y.o. female with a history of Type I DM who presents to the ED with complaints of Nausea and Vomiting x 3 days, and 8/10 Epigastric ABD Pain.    She denies any fever or chills or hematemesis.  She denieis any diarrhea or constipation, her last BM was yesterday.  She reports that she is not able to hold down food or liquids or her medications.   She did not improve with medications administered in the ED, and she was referred for medical admission and placed in observation.      Review of Systems:   Constitutional: No Weight Loss, No Weight Gain, Night Sweats, Fevers, Chills, Dizziness, Light Headedness, Fatigue, or Generalized Weakness HEENT: No Headaches, Difficulty Swallowing,Tooth/Dental Problems,Sore Throat,  No Sneezing, Rhinitis, Ear Ache, Nasal Congestion, or Post Nasal Drip,  Cardio-vascular:  No Chest pain, Orthopnea, PND, Edema in Lower Extremities, Anasarca, Dizziness, Palpitations  Resp: No Dyspnea, No DOE, No Productive Cough, No Non-Productive Cough, No Hemoptysis, No Wheezing.    GI: No Heartburn, Indigestion, Abdominal Pain, Nausea, Vomiting, Diarrhea, Constipation, Hematemesis, Hematochezia, Melena, Change in Bowel Habits,  Loss of Appetite  GU: No Dysuria, No Change in Color of Urine, No Urgency or Urinary Frequency, No Flank pain.  Musculoskeletal: No Joint Pain or Swelling, No Decreased Range of Motion, No Back Pain.  Neurologic: No Syncope, No Seizures, Muscle Weakness, Paresthesia, Vision Disturbance or Loss, No Diplopia, No Vertigo, No Difficulty Walking,  Skin: No Rash or Lesions. Psych: No Change in Mood or Affect, No Depression or Anxiety, No Memory loss, No Confusion, or Hallucinations   Past Medical  History  Diagnosis Date  . Diabetes mellitus   . Asthma   . Pancreatitis, acute 11/26/2014  . Hepatic steatosis 11/26/2014  . Liver mass 11/26/2014  . Hypertension     NOT CURRENTLY ON ANY BP MED  . Heart murmur   . Arthritis   . Gallstones   . Anxiety      Past Surgical History  Procedure Laterality Date  . Wisdom tooth extraction    . Esophagogastroduodenoscopy (egd) with propofol Left 09/20/2014    Procedure: ESOPHAGOGASTRODUODENOSCOPY (EGD) WITH PROPOFOL;  Surgeon: Arta Silence, MD;  Location: [facility] ENDOSCOPY;  Service: Endoscopy;  Laterality: Left;  . Cholecystectomy N/A 02/11/2015    Procedure: LAPAROSCOPIC CHOLECYSTECTOMY WITH INTRAOPERATIVE CHOLANGIOGRAM;  Surgeon: Greer Pickerel, MD;  Location: [facility] ORS;  Service: General;  Laterality: N/A;      Prior to Admission medications   Medication Sig Start Date End Date Taking? Authorizing Provider  albuterol (PROVENTIL HFA;VENTOLIN HFA) 108 (90 BASE) MCG/ACT inhaler Inhale 2 puffs into the lungs every 6 (six) hours as needed for shortness of breath.   Yes Historical Provider, MD  albuterol (PROVENTIL) (2.5 MG/3ML) 0.083% nebulizer solution Take 2.5 mg by nebulization every 6 (six) hours as needed for shortness of breath.   Yes Historical Provider, MD  budesonide-formoterol (SYMBICORT) 160-4.5 MCG/ACT inhaler Inhale 2 puffs into the lungs 2 (two) times daily.    Yes Historical Provider, MD  EPINEPHrine (EPIPEN) 0.3 mg/0.3 mL SOAJ injection Inject 0.3 mg into the muscle once.   Yes Historical Provider, MD  insulin aspart (NOVOLOG FLEXPEN) 100 UNIT/ML FlexPen INJECT 0-20 UNITS INTO THE SKIN AS DIRECTED. SSI - CARB SCALE, 0-20  UNITS Patient taking differently: Inject into the skin 4 (four) times daily as needed for high blood sugar. Sliding scale 10/19/13  Yes Elayne Snare, MD  Insulin Glargine (LANTUS SOLOSTAR) 100 UNIT/ML Solostar Pen Inject 36 Units into the skin daily at 10 pm. Patient taking differently: Inject 24 Units into the skin daily at  10 pm.  10/19/13  Yes Elayne Snare, MD  metoCLOPramide (REGLAN) 10 MG tablet Take 1 tablet (10 mg total) by mouth 4 (four) times daily. 04/15/15  Yes [name], DO  montelukast (SINGULAIR) 10 MG tablet Take 10 mg by mouth daily.   Yes Historical Provider, MD  omeprazole (PRILOSEC) 40 MG capsule Take 1 capsule (40 mg total) by mouth daily. 04/23/15  Yes Orson Eva, MD  ondansetron (ZOFRAN) 4 MG tablet Take 1 tablet (4 mg total) by mouth every 6 (six) hours as needed for nausea. 04/15/15  Yes [name], DO  ondansetron (ZOFRAN) 4 MG tablet Take 1 tablet (4 mg total) by mouth every 6 (six) hours. 04/23/15  Yes Patoka Lions, PA-C     Allergies  Allergen Reactions  . Peanut-Containing Drug Products Swelling    Swelling of mouth, lips  . Strawberry Extract Swelling    Swelling of mouth, lips    Social History:  reports that she has never smoked. She has never used smokeless tobacco. She reports that she uses illicit drugs (Marijuana). She reports that she does not drink alcohol.    Family History  Problem Relation Age of Onset  . Diabetes Mother   . Hyperlipidemia Mother   . Hypertension Father   . Heart disease Father   . Heart disease Maternal Grandmother   . Heart disease Maternal Grandfather   . Hypertension Paternal Grandmother   . Cancer Paternal Grandfather        Physical Exam:  GEN:    Pleasant Thin  25 y.o.  African American female examined and in no acute distress; cooperative with exam Filed Vitals:   05/24/15 2000 05/24/15 2100 05/24/15 2131 05/24/15 2200  BP: 130/86 118/78 118/78 123/70  Pulse: 118 123 123 119  Temp:      TempSrc:      Resp: 13 13 14 15   Height:      SpO2: 100% 97% 97% 97%   Blood pressure 123/70, pulse 119, temperature 98.2 F (36.8 C), temperature source Oral, resp. rate 15, height 5\' 4"  (1.626 m), last menstrual period 05/13/2015, SpO2 97 %. PSYCH: She is alert and oriented x4; does not appear anxious does not appear depressed;  affect is normal HEENT: Normocephalic and Atraumatic, Mucous membranes pink; PERRLA; EOM intact; Fundi:  Benign;  No scleral icterus, Nares: Patent, Oropharynx: Clear, Fair Dentition,    Neck:  FROM, No Cervical Lymphadenopathy nor Thyromegaly or Carotid Bruit; No JVD; Breasts:: Not examined CHEST WALL: No tenderness CHEST: Normal respiration, clear to auscultation bilaterally HEART: Regular rate and rhythm; no murmurs rubs or gallops BACK: No kyphosis or scoliosis; No CVA tenderness ABDOMEN: Positive Bowel Sounds, Scaphoid, Soft Non-Tender, No Rebound or Guarding; No Masses, No Organomegaly. Rectal Exam: Not done EXTREMITIES: No Cyanosis, Clubbing, or Edema; No Ulcerations. Genitalia: not examined PULSES: 2+ and symmetric SKIN: Normal hydration no rash or ulceration CNS:  Alert and Oriented x 4, No Focal Deficits Vascular: pulses palpable throughout    Labs on Admission:  Basic Metabolic Panel:  Recent Labs Lab 05/24/15 1653  NA 145  K 3.6  CL 105  CO2 22  GLUCOSE 100*  BUN  19  CREATININE 0.84  CALCIUM 10.9*   Liver Function Tests:  Recent Labs Lab 05/24/15 1653  AST 35  ALT 19  ALKPHOS 121  BILITOT 1.0  PROT 9.6*  ALBUMIN 5.5*    Recent Labs Lab 05/24/15 1653  LIPASE 16   No results for input(s): AMMONIA in the last 168 hours. CBC:  Recent Labs Lab 05/24/15 1653  WBC 11.9*  HGB 16.5*  HCT 47.7*  MCV 90.9  PLT 529*   Cardiac Enzymes: No results for input(s): CKTOTAL, CKMB, CKMBINDEX, TROPONINI in the last 168 hours.  BNP (last 3 results)  Recent Labs  04/11/15 0325  BNP 43.5    ProBNP (last 3 results) No results for input(s): PROBNP in the last 8760 hours.  CBG: No results for input(s): GLUCAP in the last 168 hours.  Radiological Exams on Admission: No results found.     Assessment/Plan:     25 y.o. female with  Principal Problem:    Nausea and vomiting   IV Zofran PRN   IV Reglan    IVFs   Clear Liquids   Active  Problems:    Abdominal pain   Pain Control PRN   IV Protonix         Diabetic gastroparesis (HCC)   IV Reglan      Sinus tachycardia (HCC)   IVFs   Monitor      Dehydration   IVFs      Uncontrolled type 1 diabetes mellitus (HCC)   Continue Lantus Insulin  ` SSI coverage PRN      Hypertension   Not on Meds for BP   IV Hydaralazine PRN   Monitor    DVT Prophylaxis   Lovenox     Code Status:     FULL CODE       Family Communication:   No Family Present    Disposition Plan:    Observation Status        Time spent:  7 Minutes      Bron Snellings C [facility] Pager [phone]   If 7AM -7PM Please Contact the Day Rounding Team MD for [facility]  If 7PM-7AM, Please Contact Night-Floor Coverage  [email or web address] Password [email or web address] 05/24/2015, 11:13 PM     ADDENDUM:   Patient was seen and examined on 05/24/2015

## 2015-05-24 NOTE — ED Notes (Signed)
Bed: WA22 Expected date:  Expected time:  Means of arrival:  Comments: EMS-vomiting 

## 2015-05-24 NOTE — ED Notes (Signed)
Pt successfully tolerated 600 cc of diet ginger ale without emesis.

## 2015-05-24 NOTE — ED Notes (Signed)
Pt from home via EMS with complaints of generalized abdominal pain and emesis. Pt has history of gastroparesis and diabetes. Pt's CBG in route was 99. Pt is tachycardic and is experiencing frequent episodes of emesis at time of assessment. Pt's heart rate by EMS is 146 but is sinus.

## 2015-05-25 ENCOUNTER — Encounter (HOSPITAL_COMMUNITY): Payer: Self-pay | Admitting: Oncology

## 2015-05-25 ENCOUNTER — Emergency Department (HOSPITAL_COMMUNITY)
Admission: EM | Admit: 2015-05-25 | Discharge: 2015-05-26 | Disposition: A | Discharge status: Home / Self Care | Payer: BLUE CROSS/BLUE SHIELD | Source: Home / Self Care | Attending: Emergency Medicine | Admitting: Emergency Medicine

## 2015-05-25 ENCOUNTER — Encounter (HOSPITAL_COMMUNITY): Payer: Self-pay | Admitting: Emergency Medicine

## 2015-05-25 DIAGNOSIS — Z79899 Other long term (current) drug therapy: Secondary | ICD-10-CM

## 2015-05-25 DIAGNOSIS — R1084 Generalized abdominal pain: Secondary | ICD-10-CM | POA: Insufficient documentation

## 2015-05-25 DIAGNOSIS — Z8739 Personal history of other diseases of the musculoskeletal system and connective tissue: Secondary | ICD-10-CM

## 2015-05-25 DIAGNOSIS — E119 Type 2 diabetes mellitus without complications: Secondary | ICD-10-CM

## 2015-05-25 DIAGNOSIS — R011 Cardiac murmur, unspecified: Secondary | ICD-10-CM | POA: Insufficient documentation

## 2015-05-25 DIAGNOSIS — Z794 Long term (current) use of insulin: Secondary | ICD-10-CM | POA: Insufficient documentation

## 2015-05-25 DIAGNOSIS — Z8719 Personal history of other diseases of the digestive system: Secondary | ICD-10-CM

## 2015-05-25 DIAGNOSIS — I10 Essential (primary) hypertension: Secondary | ICD-10-CM

## 2015-05-25 DIAGNOSIS — Z7951 Long term (current) use of inhaled steroids: Secondary | ICD-10-CM | POA: Insufficient documentation

## 2015-05-25 DIAGNOSIS — Z9049 Acquired absence of other specified parts of digestive tract: Secondary | ICD-10-CM | POA: Insufficient documentation

## 2015-05-25 DIAGNOSIS — R112 Nausea with vomiting, unspecified: Secondary | ICD-10-CM | POA: Insufficient documentation

## 2015-05-25 DIAGNOSIS — Z8659 Personal history of other mental and behavioral disorders: Secondary | ICD-10-CM | POA: Insufficient documentation

## 2015-05-25 DIAGNOSIS — R109 Unspecified abdominal pain: Secondary | ICD-10-CM | POA: Insufficient documentation

## 2015-05-25 DIAGNOSIS — E1143 Type 2 diabetes mellitus with diabetic autonomic (poly)neuropathy: Secondary | ICD-10-CM | POA: Diagnosis not present

## 2015-05-25 DIAGNOSIS — K3184 Gastroparesis: Secondary | ICD-10-CM | POA: Diagnosis not present

## 2015-05-25 DIAGNOSIS — J45909 Unspecified asthma, uncomplicated: Secondary | ICD-10-CM | POA: Insufficient documentation

## 2015-05-25 DIAGNOSIS — R1110 Vomiting, unspecified: Secondary | ICD-10-CM | POA: Diagnosis not present

## 2015-05-25 LAB — BASIC METABOLIC PANEL
Anion gap: 12 (ref 5–15)
BUN: 12 mg/dL (ref 6–20)
CHLORIDE: 109 mmol/L (ref 101–111)
CO2: 13 mmol/L — ABNORMAL LOW (ref 22–32)
CREATININE: 0.54 mg/dL (ref 0.44–1.00)
Calcium: 8 mg/dL — ABNORMAL LOW (ref 8.9–10.3)
GFR calc non Af Amer: >60 mL/min (ref >60)
GLUCOSE: 143 mg/dL — AB (ref 65–99)
POTASSIUM: 3.7 mmol/L (ref 3.5–5.1)
SODIUM: 134 mmol/L — AB (ref 135–145)

## 2015-05-25 LAB — CBC
HCT: 36.4 % (ref 36.0–46.0)
HEMATOCRIT: 33.3 % — AB (ref 36.0–46.0)
Hemoglobin: 11.1 g/dL — ABNORMAL LOW (ref 12.0–15.0)
Hemoglobin: 12.4 g/dL (ref 12.0–15.0)
MCH: 30.8 pg (ref 26.0–34.0)
MCH: 30.4 pg (ref 26.0–34.0)
MCHC: 33.3 g/dL (ref 30.0–36.0)
MCHC: 34.1 g/dL (ref 30.0–36.0)
MCV: 92.5 fL (ref 78.0–100.0)
MCV: 89.2 fL (ref 78.0–100.0)
PLATELETS: 344 10*3/uL (ref 150–400)
Platelets: 371 10*3/uL (ref 150–400)
RBC: 3.6 MIL/uL — ABNORMAL LOW (ref 3.87–5.11)
RBC: 4.08 MIL/uL (ref 3.87–5.11)
RDW: 13 % (ref 11.5–15.5)
RDW: 12.4 % (ref 11.5–15.5)
WBC: 9.4 10*3/uL (ref 4.0–10.5)
WBC: 5.2 10*3/uL (ref 4.0–10.5)

## 2015-05-25 LAB — COMPREHENSIVE METABOLIC PANEL
ALT: 11 U/L — ABNORMAL LOW (ref 14–54)
AST: 18 U/L (ref 15–41)
Albumin: 3.6 g/dL (ref 3.5–5.0)
Alkaline Phosphatase: 78 U/L (ref 38–126)
Anion gap: 12 (ref 5–15)
BUN: 7 mg/dL (ref 6–20)
CO2: 19 mmol/L — ABNORMAL LOW (ref 22–32)
Calcium: 8.3 mg/dL — ABNORMAL LOW (ref 8.9–10.3)
Chloride: 102 mmol/L (ref 101–111)
Creatinine, Ser: 0.38 mg/dL — ABNORMAL LOW (ref 0.44–1.00)
GFR calc Af Amer: >60 mL/min (ref >60)
GFR calc non Af Amer: >60 mL/min (ref >60)
Glucose, Bld: 150 mg/dL — ABNORMAL HIGH (ref 65–99)
Potassium: 3.5 mmol/L (ref 3.5–5.1)
Sodium: 133 mmol/L — ABNORMAL LOW (ref 135–145)
Total Bilirubin: 1.1 mg/dL (ref 0.3–1.2)
Total Protein: 6.6 g/dL (ref 6.5–8.1)

## 2015-05-25 LAB — GLUCOSE, CAPILLARY
GLUCOSE-CAPILLARY: 110 mg/dL — AB (ref 65–99)
Glucose-Capillary: 117 mg/dL — ABNORMAL HIGH (ref 65–99)
Glucose-Capillary: 152 mg/dL — ABNORMAL HIGH (ref 65–99)
Glucose-Capillary: 393 mg/dL — ABNORMAL HIGH (ref 65–99)

## 2015-05-25 LAB — CBG MONITORING, ED: Glucose-Capillary: 140 mg/dL — ABNORMAL HIGH (ref 65–99)

## 2015-05-25 LAB — LIPASE, BLOOD: Lipase: 13 U/L (ref 11–51)

## 2015-05-25 MED ORDER — METOCLOPRAMIDE HCL 5 MG/ML IJ SOLN
10.0000 mg | Freq: Once | INTRAMUSCULAR | Status: AC
  Administered 2015-05-25: 10 mg via INTRAVENOUS
  Filled 2015-05-25: qty 2

## 2015-05-25 MED ORDER — SODIUM CHLORIDE 0.9 % IV BOLUS (SEPSIS)
1000.0000 mL | Freq: Once | INTRAVENOUS | Status: AC
Start: 2015-05-25 — End: 2015-05-25
  Administered 2015-05-25: 1000 mL via INTRAVENOUS

## 2015-05-25 MED ORDER — BOOST / RESOURCE BREEZE PO LIQD: 1.0000 | Freq: Three times a day (TID) | ORAL | Status: DC

## 2015-05-25 MED ORDER — HYDROMORPHONE HCL 1 MG/ML IJ SOLN
1.0000 mg | Freq: Once | INTRAMUSCULAR | Status: AC
  Administered 2015-05-25: 1 mg via INTRAVENOUS
  Filled 2015-05-25: qty 1

## 2015-05-25 MED ORDER — INSULIN GLARGINE 100 UNIT/ML SOLOSTAR PEN: 24.0000 [IU] | PEN_INJECTOR | Freq: Every day | SUBCUTANEOUS | Status: DC

## 2015-05-25 NOTE — Progress Notes (Signed)
PROGRESS NOTE    [name]  [id number] DOB: [birth year] DOA: 05/24/2015 PCP: Vicenta Aly, FNP Outpatient Specialists: Brief Narrative: [name] is a 25 y.o. female with a history of Type I DM who presents to the ED with complaints of Nausea and Vomiting x 3 days, and 8/10 Epigastric ABD Pain. She denies any fever or chills or hematemesis. She denied any diarrhea or constipation,last BM was 5/4. Improvin   Assessment & Plan:  1. Nausea and vomiting -presumed DM gastropareiss -improving, advance diet  -continue Reglan and PRN Zofran, PPI -home today if tol lunch -needs gastric emptying scan per PCP  2. Abd pain -due to #1, resolved, exam benign -continue supportive care  3. Uncontrolled type 1 diabetes mellitus  -Continue Lantus, SSI   4. Hypertension -stable, Not on Meds for this -IV Hydaralazine PRN  5. H/o Asthma -stable, nebs PRN  DVT Prophylaxis: Lovenox  Code Status: FULL CODE  Family Communication: No Family Present  Disposition Plan:home later today if tol lunch        Subjective: Feels well, no distress, no further N/V, belly feels ok  Objective: Filed Vitals:   05/24/15 2330 05/25/15 0002 05/25/15 0023 05/25/15 0407  BP:   142/89 100/56  Pulse: 123  123 107  Temp:   98.1 F (36.7 C) 98.1 F (36.7 C)  TempSrc:   Oral Oral  Resp: 18  18 18   Height:   5\' 4"  (1.626 m)   Weight:  49.4 kg (108 lb 14.5 oz) 52.028 kg (114 lb 11.2 oz)   SpO2: 100%  100% 99%   No intake or output data in the 24 hours ending 05/25/15 1304 Filed Weights   05/25/15 0002 05/25/15 0023  Weight: 49.4 kg (108 lb 14.5 oz) 52.028 kg (114 lb 11.2 oz)    Examination:  General exam: Appears calm and comfortable , thinly built, no distress Respiratory system: Clear to auscultation. Respiratory effort normal. Cardiovascular system: S1 & S2 heard, RRR. No JVD, murmurs, rubs, gallops or clicks. No pedal edema. Gastrointestinal system:  Abdomen is nondistended, soft and nontender. No organomegaly or masses felt. Normal bowel sounds heard. Central nervous system: Alert and oriented. No focal neurological deficits. Extremities: Symmetric 5 x 5 power. Skin: No rashes, lesions or ulcers Psychiatry: Judgement and insight appear normal. Mood & affect appropriate.     Data Reviewed: I have personally reviewed following labs and imaging studies  CBC:  Recent Labs Lab 05/24/15 1653 05/25/15 0531  WBC 11.9* 9.4  HGB 16.5* 11.1*  HCT 47.7* 33.3*  MCV 90.9 92.5  PLT 529* [id number]   Basic Metabolic Panel:  Recent Labs Lab 05/24/15 1653 05/25/15 0531  NA 145 134*  K 3.6 3.7  CL 105 109  CO2 22 13*  GLUCOSE 100* 143*  BUN 19 12  CREATININE 0.84 0.54  CALCIUM 10.9* 8.0*   GFR: Estimated Creatinine Clearance: 88.2 mL/min (by C-G formula based on Cr of 0.54). Liver Function Tests:  Recent Labs Lab 05/24/15 1653  AST 35  ALT 19  ALKPHOS 121  BILITOT 1.0  PROT 9.6*  ALBUMIN 5.5*    Recent Labs Lab 05/24/15 1653  LIPASE 16   No results for input(s): AMMONIA in the last 168 hours. Coagulation Profile: No results for input(s): INR, PROTIME in the last 168 hours. Cardiac Enzymes: No results for input(s): CKTOTAL, CKMB, CKMBINDEX, TROPONINI in the last 168 hours. BNP (last 3 results) No results for input(s): PROBNP in the last 8760 hours. HbA1C: No results for  input(s): HGBA1C in the last 72 hours. CBG:  Recent Labs Lab 05/25/15 0009 05/25/15 0405 05/25/15 0740 05/25/15 1152  GLUCAP 393* 117* 110* 152*   Lipid Profile: No results for input(s): CHOL, HDL, LDLCALC, TRIG, CHOLHDL, LDLDIRECT in the last 72 hours. Thyroid Function Tests: No results for input(s): TSH, T4TOTAL, FREET4, T3FREE, THYROIDAB in the last 72 hours. Anemia Panel: No results for input(s): VITAMINB12, FOLATE, FERRITIN, TIBC, IRON, RETICCTPCT in the last 72 hours. Urine analysis:    Component Value Date/Time   COLORURINE YELLOW  05/24/2015 1937   APPEARANCEUR CLOUDY* 05/24/2015 1937   LABSPEC 1.031* 05/24/2015 1937   PHURINE 5.5 05/24/2015 1937   GLUCOSEU >1000* 05/24/2015 1937   GLUCOSEU >=1000 11/07/2012 1205   HGBUR NEGATIVE 05/24/2015 1937   BILIRUBINUR NEGATIVE 05/24/2015 1937   KETONESUR >80* 05/24/2015 1937   PROTEINUR NEGATIVE 05/24/2015 1937   UROBILINOGEN 0.2 11/24/2014 1045   NITRITE NEGATIVE 05/24/2015 1937   LEUKOCYTESUR NEGATIVE 05/24/2015 1937   Sepsis Labs: @LABRCNTIP (procalcitonin:4,lacticidven:4)  )No results found for this or any previous visit (from the past 240 hour(s)).       Radiology Studies: No results found.      Scheduled Meds: . sodium chloride   Intravenous STAT  . enoxaparin (LOVENOX) injection  40 mg Subcutaneous Q24H  . feeding supplement  1 Container Oral TID BM  . insulin aspart  0-9 Units Subcutaneous Q4H  . insulin glargine  24 Units Subcutaneous Q2200  . metoCLOPramide (REGLAN) injection  10 mg Intravenous Q6H  . sodium chloride flush  3 mL Intravenous Q12H   Continuous Infusions: . sodium chloride 75 mL/hr at 05/25/15 1111        Time spent: 68min    Domenic Polite, MD [facility] Pager [phone]  If 7PM-7AM, please contact night-coverage [email or web address] Password [email or web address] 05/25/2015, 1:04 PM

## 2015-05-25 NOTE — ED Notes (Signed)
Per pt, states vomiting since yesterday-states her sugars have been normal although smells fruity per mother

## 2015-05-25 NOTE — ED Provider Notes (Signed)
CSN: [id number]     Arrival date & time 05/25/15  1853 History   First MD Initiated Contact with Patient 05/25/15 1954     Chief Complaint  Patient presents with  . Emesis     (Consider location/radiation/quality/duration/timing/severity/associated sxs/prior Treatment) HPI   25 year old female with abdominal pain and nausea/vomiting. Worsening since yesterday. She has a past history of gastroparesis. States her current symptoms are consistent with prior exacerbations. She was seen in the emergency room yesterday for the same. She was improved at the time of discharge. Her symptoms returned again shortly after getting home. She reports compliance with her medications. She says her blood sugars have been running in the 100s. No fevers. No urinary complaints. No sick contacts.  Past Medical History  Diagnosis Date  . Diabetes mellitus   . Asthma   . Pancreatitis, acute 11/26/2014  . Hepatic steatosis 11/26/2014  . Liver mass 11/26/2014  . Hypertension     NOT CURRENTLY ON ANY BP MED  . Heart murmur   . Arthritis   . Gallstones   . Anxiety    Past Surgical History  Procedure Laterality Date  . Wisdom tooth extraction    . Esophagogastroduodenoscopy (egd) with propofol Left 09/20/2014    Procedure: ESOPHAGOGASTRODUODENOSCOPY (EGD) WITH PROPOFOL;  Surgeon: Arta Silence, MD;  Location: [facility] ENDOSCOPY;  Service: Endoscopy;  Laterality: Left;  . Cholecystectomy N/A 02/11/2015    Procedure: LAPAROSCOPIC CHOLECYSTECTOMY WITH INTRAOPERATIVE CHOLANGIOGRAM;  Surgeon: Greer Pickerel, MD;  Location: [facility] ORS;  Service: General;  Laterality: N/A;   Family History  Problem Relation Age of Onset  . Diabetes Mother   . Hyperlipidemia Mother   . Hypertension Father   . Heart disease Father   . Heart disease Maternal Grandmother   . Heart disease Maternal Grandfather   . Hypertension Paternal Grandmother   . Cancer Paternal Grandfather    Social History  Substance Use Topics  . Smoking status: Never  Smoker   . Smokeless tobacco: Never Used  . Alcohol Use: No   OB History    Gravida Para Term Preterm AB TAB SAB Ectopic Multiple Living   2 1 0 1 1 1 0 0 0 1      Review of Systems  All systems reviewed and negative, other than as noted in HPI.   Allergies  Peanut-containing drug products and Strawberry extract  Home Medications   Prior to Admission medications   Medication Sig Start Date End Date Taking? Authorizing Provider  albuterol (PROVENTIL HFA;VENTOLIN HFA) 108 (90 BASE) MCG/ACT inhaler Inhale 2 puffs into the lungs every 6 (six) hours as needed for shortness of breath.    Historical Provider, MD  albuterol (PROVENTIL) (2.5 MG/3ML) 0.083% nebulizer solution Take 2.5 mg by nebulization every 6 (six) hours as needed for shortness of breath.    Historical Provider, MD  budesonide-formoterol (SYMBICORT) 160-4.5 MCG/ACT inhaler Inhale 2 puffs into the lungs 2 (two) times daily.     Historical Provider, MD  EPINEPHrine (EPIPEN) 0.3 mg/0.3 mL SOAJ injection Inject 0.3 mg into the muscle once.    Historical Provider, MD  insulin aspart (NOVOLOG FLEXPEN) 100 UNIT/ML FlexPen INJECT 0-20 UNITS INTO THE SKIN AS DIRECTED. SSI - CARB SCALE, 0-20 UNITS Patient taking differently: Inject into the skin 4 (four) times daily as needed for high blood sugar. Sliding scale 10/19/13   Elayne Snare, MD  Insulin Glargine (LANTUS SOLOSTAR) 100 UNIT/ML Solostar Pen Inject 24 Units into the skin daily at 10 pm. 05/25/15   Jacinta Shoe  Broadus John, MD  metoCLOPramide (REGLAN) 10 MG tablet Take 1 tablet (10 mg total) by mouth 4 (four) times daily. 04/15/15   [name], DO  montelukast (SINGULAIR) 10 MG tablet Take 10 mg by mouth daily.    Historical Provider, MD  omeprazole (PRILOSEC) 40 MG capsule Take 1 capsule (40 mg total) by mouth daily. 04/23/15   Orson Eva, MD  ondansetron (ZOFRAN) 4 MG tablet Take 1 tablet (4 mg total) by mouth every 6 (six) hours. 04/23/15   [place] Lions, PA-C   LMP  05/13/2015 Physical Exam  Constitutional: She appears well-developed and well-nourished.  Laying on side retching. Appears uncomfortable.   HENT:  Head: Normocephalic and atraumatic.  Eyes: Conjunctivae are normal. Right eye exhibits no discharge. Left eye exhibits no discharge.  Neck: Neck supple.  Cardiovascular: Normal rate, regular rhythm and normal heart sounds.  Exam reveals no gallop and no friction rub.   No murmur heard. Pulmonary/Chest: Effort normal and breath sounds normal. No respiratory distress.  Abdominal: Soft. She exhibits no distension. There is tenderness.  Mild diffuse tenderness.  Musculoskeletal: She exhibits no edema or tenderness.  Neurological: She is alert.  Skin: Skin is warm and dry.  Psychiatric: She has a normal mood and affect. Her behavior is normal. Thought content normal.  Nursing note and vitals reviewed.   ED Course  Procedures (including critical care time) Labs Review Labs Reviewed  CBG MONITORING, ED - Abnormal; Notable for the following:    Glucose-Capillary 140 (*)    All other components within normal limits  CBC  LIPASE, BLOOD  COMPREHENSIVE METABOLIC PANEL  URINALYSIS, ROUTINE W REFLEX MICROSCOPIC (NOT AT [facility])    Imaging Review No results found. I have personally reviewed and evaluated these images and lab results as part of my medical decision-making.   EKG Interpretation None      MDM   Final diagnoses:  Non-intractable vomiting with nausea, vomiting of unspecified type  Generalized abdominal pain    Symptoms typical of ger gastroparesis. Now significant improved. She would like to go home.     Virgel Manifold, MD 05/30/15 [phone]

## 2015-05-26 ENCOUNTER — Emergency Department (HOSPITAL_COMMUNITY)
Admission: EM | Admit: 2015-05-26 | Discharge: 2015-05-26 | Disposition: A | Discharge status: Home / Self Care | Payer: BLUE CROSS/BLUE SHIELD | Source: Home / Self Care | Attending: Emergency Medicine | Admitting: Emergency Medicine

## 2015-05-26 ENCOUNTER — Encounter (HOSPITAL_COMMUNITY): Payer: Self-pay

## 2015-05-26 DIAGNOSIS — I10 Essential (primary) hypertension: Secondary | ICD-10-CM | POA: Insufficient documentation

## 2015-05-26 DIAGNOSIS — Z79899 Other long term (current) drug therapy: Secondary | ICD-10-CM | POA: Insufficient documentation

## 2015-05-26 DIAGNOSIS — Z79891 Long term (current) use of opiate analgesic: Secondary | ICD-10-CM | POA: Insufficient documentation

## 2015-05-26 DIAGNOSIS — Z91010 Allergy to peanuts: Secondary | ICD-10-CM

## 2015-05-26 DIAGNOSIS — E109 Type 1 diabetes mellitus without complications: Secondary | ICD-10-CM

## 2015-05-26 DIAGNOSIS — J45909 Unspecified asthma, uncomplicated: Secondary | ICD-10-CM | POA: Insufficient documentation

## 2015-05-26 DIAGNOSIS — K3184 Gastroparesis: Secondary | ICD-10-CM | POA: Insufficient documentation

## 2015-05-26 DIAGNOSIS — Z7951 Long term (current) use of inhaled steroids: Secondary | ICD-10-CM

## 2015-05-26 LAB — CBC WITH DIFFERENTIAL/PLATELET
Basophils Absolute: 0 K/uL (ref 0.0–0.1)
Basophils Relative: 0 %
Eosinophils Absolute: 0.1 K/uL (ref 0.0–0.7)
Eosinophils Relative: 2 %
HCT: 33.1 % — ABNORMAL LOW (ref 36.0–46.0)
Hemoglobin: 11.4 g/dL — ABNORMAL LOW (ref 12.0–15.0)
Lymphocytes Relative: 26 %
Lymphs Abs: 1.4 K/uL (ref 0.7–4.0)
MCH: 30.3 pg (ref 26.0–34.0)
MCHC: 34.4 g/dL (ref 30.0–36.0)
MCV: 88 fL (ref 78.0–100.0)
Monocytes Absolute: 0.2 K/uL (ref 0.1–1.0)
Monocytes Relative: 5 %
Neutro Abs: 3.5 K/uL (ref 1.7–7.7)
Neutrophils Relative %: 67 %
Platelets: 320 K/uL (ref 150–400)
RBC: 3.76 MIL/uL — ABNORMAL LOW (ref 3.87–5.11)
RDW: 12.2 % (ref 11.5–15.5)
WBC: 5.2 K/uL (ref 4.0–10.5)

## 2015-05-26 LAB — URINE MICROSCOPIC-ADD ON
Bacteria, UA: NONE SEEN
RBC / HPF: NONE SEEN RBC/hpf (ref 0–5)
RBC / HPF: NONE SEEN RBC/hpf (ref 0–5)

## 2015-05-26 LAB — URINALYSIS, ROUTINE W REFLEX MICROSCOPIC
Bilirubin Urine: NEGATIVE
Bilirubin Urine: NEGATIVE
Glucose, UA: >1000 mg/dL — AB
Glucose, UA: >1000 mg/dL — AB
Hgb urine dipstick: NEGATIVE
Hgb urine dipstick: NEGATIVE
Ketones, ur: >80 mg/dL — AB
Ketones, ur: >80 mg/dL — AB
Leukocytes, UA: NEGATIVE
Nitrite: NEGATIVE
Nitrite: NEGATIVE
Protein, ur: NEGATIVE mg/dL
Protein, ur: NEGATIVE mg/dL
Specific Gravity, Urine: 1.015 (ref 1.005–1.030)
Specific Gravity, Urine: 1.021 (ref 1.005–1.030)
pH: 5.5 (ref 5.0–8.0)
pH: 6 (ref 5.0–8.0)

## 2015-05-26 LAB — COMPREHENSIVE METABOLIC PANEL WITH GFR
ALT: 13 U/L — ABNORMAL LOW (ref 14–54)
AST: 17 U/L (ref 15–41)
Albumin: 3.4 g/dL — ABNORMAL LOW (ref 3.5–5.0)
Alkaline Phosphatase: 73 U/L (ref 38–126)
Anion gap: 12 (ref 5–15)
BUN: <5 mg/dL — ABNORMAL LOW (ref 6–20)
CO2: 21 mmol/L — ABNORMAL LOW (ref 22–32)
Calcium: 8.2 mg/dL — ABNORMAL LOW (ref 8.9–10.3)
Chloride: 105 mmol/L (ref 101–111)
Creatinine, Ser: 0.49 mg/dL (ref 0.44–1.00)
GFR calc Af Amer: >60 mL/min
GFR calc non Af Amer: >60 mL/min
Glucose, Bld: 125 mg/dL — ABNORMAL HIGH (ref 65–99)
Potassium: 3.4 mmol/L — ABNORMAL LOW (ref 3.5–5.1)
Sodium: 138 mmol/L (ref 135–145)
Total Bilirubin: 1.3 mg/dL — ABNORMAL HIGH (ref 0.3–1.2)
Total Protein: 6.3 g/dL — ABNORMAL LOW (ref 6.5–8.1)

## 2015-05-26 LAB — LIPASE, BLOOD: Lipase: 12 U/L (ref 11–51)

## 2015-05-26 MED ORDER — HYDROMORPHONE HCL 1 MG/ML IJ SOLN
1.0000 mg | Freq: Once | INTRAMUSCULAR | Status: AC
  Administered 2015-05-26: 1 mg via INTRAVENOUS
  Filled 2015-05-26: qty 1

## 2015-05-26 MED ORDER — PROMETHAZINE HCL 25 MG PO TABS: 25.0000 mg | ORAL_TABLET | Freq: Four times a day (QID) | ORAL | Status: DC | PRN

## 2015-05-26 MED ORDER — METOCLOPRAMIDE HCL 5 MG/ML IJ SOLN
10.0000 mg | Freq: Once | INTRAMUSCULAR | Status: AC
  Administered 2015-05-26: 10 mg via INTRAVENOUS
  Filled 2015-05-26: qty 2

## 2015-05-26 MED ORDER — OXYCODONE-ACETAMINOPHEN 5-325 MG PO TABS: 1.0000 | ORAL_TABLET | ORAL | Status: DC | PRN

## 2015-05-26 MED ORDER — SODIUM CHLORIDE 0.9 % IV BOLUS (SEPSIS)
1000.0000 mL | Freq: Once | INTRAVENOUS | Status: AC
  Administered 2015-05-26: 1000 mL via INTRAVENOUS

## 2015-05-26 NOTE — ED Notes (Signed)
Pt c/o severe diffuse abdominal pain, nausea, emesis. Pt seen in ED last night for the same, hx gastritis and DM 1.

## 2015-05-26 NOTE — Discharge Instructions (Signed)

## 2015-05-26 NOTE — ED Notes (Signed)
She c/o persistent abd. Pain--seen here for same within last 24 hours.  She arrives in no distress.

## 2015-05-26 NOTE — ED Notes (Signed)
Bed: [id number] Expected date:  Expected time:  Means of arrival:  Comments: EMS n/v

## 2015-05-26 NOTE — ED Provider Notes (Signed)
CSN: [id number]     Arrival date & time 05/26/15  1149 History   First MD Initiated Contact with Patient 05/26/15 1236     Chief Complaint  Patient presents with  . Abdominal Pain     (Consider location/radiation/quality/duration/timing/severity/associated sxs/prior Treatment) HPI   Shenaya Sainthilaire is a 25 year old female with a past mental history of type 1 diabetes, gastroparesis, HTN who presents the ED today complaining of abdominal pain, nausea and vomiting. Patient was recently admitted to the hospital for gastroparesis flare for continued IV hydration and analgesia. She was discharged 2 days ago with improved symptoms however her symptoms returned yesterday so she came back to the ED. She receives IV fluids and analgesia with improved symptoms yesterday. However, patient states that when she woke up this morning she felt severe diffuse abdominal pain and had too many to count episodes of nonbloody, nonbilious emesis. She tried taking her home Zofran without relief. She feels this is a recurrence of her gastroparesis. She denies fevers, chills, dysuria, diarrhea, melena, hematochezia.   Past Medical History  Diagnosis Date  . Diabetes mellitus   . Asthma   . Pancreatitis, acute 11/26/2014  . Hepatic steatosis 11/26/2014  . Liver mass 11/26/2014  . Hypertension     NOT CURRENTLY ON ANY BP MED  . Heart murmur   . Arthritis   . Gallstones   . Anxiety    Past Surgical History  Procedure Laterality Date  . Wisdom tooth extraction    . Esophagogastroduodenoscopy (egd) with propofol Left 09/20/2014    Procedure: ESOPHAGOGASTRODUODENOSCOPY (EGD) WITH PROPOFOL;  Surgeon: Arta Silence, MD;  Location: [facility];  Service: Endoscopy;  Laterality: Left;  . Cholecystectomy N/A 02/11/2015    Procedure: LAPAROSCOPIC CHOLECYSTECTOMY WITH INTRAOPERATIVE CHOLANGIOGRAM;  Surgeon: Greer Pickerel, MD;  Location: [facility];  Service: General;  Laterality: N/A;   Family History  Problem Relation Age of  Onset  . Diabetes Mother   . Hyperlipidemia Mother   . Hypertension Father   . Heart disease Father   . Heart disease Maternal Grandmother   . Heart disease Maternal Grandfather   . Hypertension Paternal Grandmother   . Cancer Paternal Grandfather    Social History  Substance Use Topics  . Smoking status: Never Smoker   . Smokeless tobacco: Never Used  . Alcohol Use: No   OB History    Gravida Para Term Preterm AB TAB SAB Ectopic Multiple Living   2 1 0 1 1 1 0 0 0 1      Review of Systems  All other systems reviewed and are negative.     Allergies  Peanut-containing drug products and Strawberry extract  Home Medications   Prior to Admission medications   Medication Sig Start Date End Date Taking? Authorizing Provider  ondansetron (ZOFRAN) 4 MG tablet Take 1 tablet (4 mg total) by mouth every 6 (six) hours. 04/23/15  Yes McMullen Lions, PA-C  albuterol (PROVENTIL HFA;VENTOLIN HFA) 108 (90 BASE) MCG/ACT inhaler Inhale 2 puffs into the lungs every 6 (six) hours as needed for shortness of breath.    Historical Provider, MD  albuterol (PROVENTIL) (2.5 MG/3ML) 0.083% nebulizer solution Take 2.5 mg by nebulization every 6 (six) hours as needed for shortness of breath.    Historical Provider, MD  budesonide-formoterol (SYMBICORT) 160-4.5 MCG/ACT inhaler Inhale 2 puffs into the lungs 2 (two) times daily.     Historical Provider, MD  EPINEPHrine (EPIPEN) 0.3 mg/0.3 mL SOAJ injection Inject 0.3 mg into the muscle once.  Historical Provider, MD  insulin aspart (NOVOLOG FLEXPEN) 100 UNIT/ML FlexPen INJECT 0-20 UNITS INTO THE SKIN AS DIRECTED. SSI - CARB SCALE, 0-20 UNITS Patient taking differently: Inject into the skin 4 (four) times daily as needed for high blood sugar. Sliding scale 10/19/13   Elayne Snare, MD  Insulin Glargine (LANTUS SOLOSTAR) 100 UNIT/ML Solostar Pen Inject 24 Units into the skin daily at 10 pm. 05/25/15   Domenic Polite, MD  metoCLOPramide (REGLAN) 10 MG tablet  Take 1 tablet (10 mg total) by mouth 4 (four) times daily. 04/15/15   [name], DO  montelukast (SINGULAIR) 10 MG tablet Take 10 mg by mouth daily.    Historical Provider, MD  omeprazole (PRILOSEC) 40 MG capsule Take 1 capsule (40 mg total) by mouth daily. 04/23/15   Orson Eva, MD  oxyCODONE-acetaminophen (PERCOCET/ROXICET) 5-325 MG tablet Take 1 tablet by mouth every 4 (four) hours as needed for severe pain. 05/26/15   Virgel Manifold, MD   BP 141/109 mmHg  Pulse 106  Temp(Src) 98.7 F (37.1 C) (Oral)  Resp 22  SpO2 98%  LMP 05/13/2015 Physical Exam  Constitutional: She is oriented to person, place, and time. She appears well-developed and well-nourished. No distress.  Patient actively vomiting and moaning in pain  HENT:  Head: Normocephalic and atraumatic.  Mouth/Throat: No oropharyngeal exudate.  Eyes: Conjunctivae and EOM are normal. Pupils are equal, round, and reactive to light. Right eye exhibits no discharge. Left eye exhibits no discharge. No scleral icterus.  Cardiovascular: Normal rate, regular rhythm, normal heart sounds and intact distal pulses.  Exam reveals no gallop and no friction rub.   No murmur heard. Pulmonary/Chest: Effort normal and breath sounds normal. No respiratory distress. She has no wheezes. She has no rales. She exhibits no tenderness.  Abdominal: Soft. Bowel sounds are normal. She exhibits no distension and no mass. There is tenderness ( Diffuse). There is no rebound and no guarding.  Musculoskeletal: Normal range of motion. She exhibits no edema.  Neurological: She is alert and oriented to person, place, and time.  Skin: Skin is warm and dry. No rash noted. She is not diaphoretic. No erythema. No pallor.  Psychiatric: She has a normal mood and affect. Her behavior is normal.  Nursing note and vitals reviewed.   ED Course  Procedures (including critical care time) Labs Review Labs Reviewed  CBC WITH DIFFERENTIAL/PLATELET - Abnormal; Notable for the  following:    RBC 3.76 (*)    Hemoglobin 11.4 (*)    HCT 33.1 (*)    All other components within normal limits  COMPREHENSIVE METABOLIC PANEL - Abnormal; Notable for the following:    Potassium 3.4 (*)    CO2 21 (*)    Glucose, Bld 125 (*)    BUN <5 (*)    Calcium 8.2 (*)    Total Protein 6.3 (*)    Albumin 3.4 (*)    ALT 13 (*)    Total Bilirubin 1.3 (*)    All other components within normal limits  URINALYSIS, ROUTINE W REFLEX MICROSCOPIC (NOT AT [facility]) - Abnormal; Notable for the following:    Glucose, UA >1000 (*)    Ketones, ur >80 (*)    All other components within normal limits  URINE MICROSCOPIC-ADD ON - Abnormal; Notable for the following:    Squamous Epithelial / LPF 0-5 (*)    All other components within normal limits  LIPASE, BLOOD    Imaging Review No results found. I have personally reviewed and  evaluated these images and lab results as part of my medical decision-making.   EKG Interpretation None      MDM   Final diagnoses:  Gastroparesis   25 year old female with history of diabetes and gastroparesis presents the ED with severe abdominal pain, nausea and vomiting. Patient states this feels like her typical gastroparesis flare. She was seen in ED yesterday for similar symptoms. She was improved after IV analgesia but today when she woke up was unable to control her symptoms with home medications. Presentation to ED patient appears to be very uncomfortable and is actively vomiting. Abdomen is tender, diffusely but is not rigid or peritoneal. There is IV fluids, pain medication and Reglan given. Upon reexamination patient reports complete symptomatic improvement. There were no further episodes of emesis while in the ED. Patient requesting prescription for home Phenergan. She was given pain medication prescription last night and still has remaining tablets at home. Will not give additional home pain medications at this time. No sign of DKA on blood work. Glucose is  125. No anion gap. Discussed treatment options with patient and she would like to go home and attempt to manage her symptoms there. She has a follow-up appointment with her gastroenterologist soon. She is scheduled to have a gastric emptying study done. Pt is hemodynamically stable and ready for discharge.      Dondra Spry [place], PA-C 05/26/15 2030  Varney Biles, MD 05/28/15 0201

## 2015-05-26 NOTE — Discharge Instructions (Signed)
Gastroparesis °Gastroparesis, also called delayed gastric emptying, is a condition in which food takes longer than normal to empty from the stomach. The condition is usually long-lasting (chronic). °CAUSES °This condition may be caused by: °· An endocrine disorder, such as hypothyroidism or diabetes. Diabetes is the most common cause of this condition. °· A nervous system disease, such as Parkinson disease or multiple sclerosis. °· Cancer, infection, or surgery of the stomach or vagus nerve. °· A connective tissue disorder, such as scleroderma. °· Certain medicines. °In most cases, the cause is not known. °RISK FACTORS °This condition is more likely to develop in: °· People with certain disorders, including endocrine disorders, eating disorders, amyloidosis, and scleroderma. °· People with certain diseases, including Parkinson disease or multiple sclerosis. °· People with cancer or infection of the stomach or vagus nerve. °· People who have had surgery on the stomach or vagus nerve. °· People who take certain medicines. °· Women. °SYMPTOMS °Symptoms of this condition include: °· An early feeling of fullness when eating. °· Nausea. °· Weight loss. °· Vomiting. °· Heartburn. °· Abdominal bloating. °· Inconsistent blood glucose levels. °· Lack of appetite. °· Acid from the stomach coming up into the esophagus (gastroesophageal reflux). °· Spasms of the stomach. °Symptoms may come and go. °DIAGNOSIS °This condition is diagnosed with tests, such as: °· Tests that check how long it takes food to move through the stomach and intestines. These tests include: °¨ Upper gastrointestinal (GI) series. In this test, X-rays of the intestines are taken after you drink a liquid. The liquid makes the intestines show up better on the X-rays. °¨ Gastric emptying scintigraphy. In this test, scans are taken after you eat food that contains a small amount of radioactive material. °¨ Wireless capsule GI monitoring system. This test  involves swallowing a capsule that records information about movement through the stomach. °· Gastric manometry. This test measures electrical and muscular activity in the stomach. It is done with a thin tube that is passed down the throat and into the stomach. °· Endoscopy. This test checks for abnormalities in the lining of the stomach. It is done with a long, thin tube that is passed down the throat and into the stomach. °· An ultrasound. This test can help rule out gallbladder disease or pancreatitis as a cause of your symptoms. It uses sound waves to take pictures of the inside of your body. °TREATMENT °There is no cure for gastroparesis. This condition may be managed with: °· Treatment of the underlying condition causing the gastroparesis. °· Lifestyle changes, including exercise and dietary changes. Dietary changes can include: °¨ Changes in what and when you eat. °¨ Eating smaller meals more often. °¨ Eating low-fat foods. °¨ Eating low-fiber forms of high-fiber foods, such as cooked vegetables instead of raw vegetables. °¨ Having liquid foods in place of solid foods. Liquid foods are easier to digest. °· Medicines. These may be given to control nausea and vomiting and to stimulate stomach muscles. °· Getting food through a feeding tube. This may be done in severe cases. °· A gastric neurostimulator. This is a device that is inserted into the body with surgery. It helps improve stomach emptying and control nausea and vomiting. °HOME CARE INSTRUCTIONS °· Follow your health care provider's instructions about exercise and diet. °· Take medicines only as directed by your health care provider. °SEEK MEDICAL CARE IF: °· Your symptoms do not improve with treatment. °· You have new symptoms. °SEEK IMMEDIATE MEDICAL CARE IF: °· You have   severe abdominal pain that does not improve with treatment.  You have nausea that does not go away.  You cannot keep fluids down.   This information is not intended to replace  advice given to you by your health care provider. Make sure you discuss any questions you have with your health care provider.  Follow up with your GI doctor as soon as possible for reevaluation. Continue taking home pain medication and antinausea medication as scheduled. Encourage adequate hydration, drink plenty of fluids. Return to the ED if you experience worsening or symptoms, fevers, chills, vomiting.

## 2015-05-26 NOTE — Discharge Summary (Addendum)
Physician Discharge Summary  [name] [id number] DOB: [birth year] DOA: 05/24/2015  PCP: Vicenta Aly, FNP  Admit date: 05/24/2015 Discharge date: 05/25/2015  Time spent: 35 minutes  Recommendations for Outpatient Follow-up:  1. PCP Vicenta Aly, would benefit from Gastric Emptying scan after FU   Discharge Diagnoses:  Principal Problem:   Nausea and vomiting Active Problems:   Uncontrolled type 1 diabetes mellitus with hyperglycemia   Sinus tachycardia (HCC)   Dehydration   Diabetic gastroparesis (HCC)   Abdominal pain   Hypertension   Abdominal pain in female   Discharge Condition: stable  Diet recommendation: DIabetic  Filed Weights   05/25/15 0002 05/25/15 0023  Weight: 49.4 kg (108 lb 14.5 oz) 52.028 kg (114 lb 11.2 oz)    History of present illness:  Chief Complaint: Nausea and Vomiting HPI: [name] is a 25 y.o. female with a history of Type I DM who presented to the ED with complaints of Nausea and Vomiting x 3 days, and 8/10 Epigastric ABD Pain.No fever/chills or hematemesis. She denied any diarrhea or constipation, her last BM was 5/4. She reported not being able to hold down food or liquids or her medications. She did not improve with medications administered in the ED, and she was referred for medical admission   Hospital Course:  1. Nausea and vomiting -presumed DM gastropareiss -improved with supportive care, IVF, Reglan, diet advanced and tolerated breakfast and lunch today without any vomiting -needs gastric emptying scan per PCP  2. Abd pain -due to #1, resolved, exam benign -continue supportive care  3. Uncontrolled type 1 diabetes mellitus with hyperglycemia -Continue Lantus, SSI   4. Hypertension -stable, Not on Meds for this  5. H/o Asthma -stable, nebs PRN  Discharge Exam: Filed Vitals:   05/25/15 0407 05/25/15 1347  BP: 100/56 130/86  Pulse: 107 107  Temp: 98.1 F (36.7 C) 98.3 F (36.8 C)  Resp: 18 18     General: AAOx3 Cardiovascular: S1S2/RRR Respiratory: CTAB  Discharge Instructions   Discharge Instructions    Diet Carb Modified    Complete by:  As directed      Discharge instructions    Complete by:  As directed   Bland diabetic diet, advance slowly as tolerated     Increase activity slowly    Complete by:  As directed           Discharge Medication List as of 05/25/2015  3:28 PM    CONTINUE these medications which have CHANGED   Details  Insulin Glargine (LANTUS SOLOSTAR) 100 UNIT/ML Solostar Pen Inject 24 Units into the skin daily at 10 pm., Starting 05/25/2015, Until Discontinued, No Print      CONTINUE these medications which have NOT CHANGED   Details  albuterol (PROVENTIL HFA;VENTOLIN HFA) 108 (90 BASE) MCG/ACT inhaler Inhale 2 puffs into the lungs every 6 (six) hours as needed for shortness of breath., Until Discontinued, Historical Med    albuterol (PROVENTIL) (2.5 MG/3ML) 0.083% nebulizer solution Take 2.5 mg by nebulization every 6 (six) hours as needed for shortness of breath., Until Discontinued, Historical Med    budesonide-formoterol (SYMBICORT) 160-4.5 MCG/ACT inhaler Inhale 2 puffs into the lungs 2 (two) times daily. , Until Discontinued, Historical Med    EPINEPHrine (EPIPEN) 0.3 mg/0.3 mL SOAJ injection Inject 0.3 mg into the muscle once., Historical Med    insulin aspart (NOVOLOG FLEXPEN) 100 UNIT/ML FlexPen INJECT 0-20 UNITS INTO THE SKIN AS DIRECTED. SSI - CARB SCALE, 0-20 UNITS, Normal    metoCLOPramide (REGLAN)  10 MG tablet Take 1 tablet (10 mg total) by mouth 4 (four) times daily., Starting 04/15/2015, Until Discontinued, Normal    montelukast (SINGULAIR) 10 MG tablet Take 10 mg by mouth daily., Until Discontinued, Historical Med    omeprazole (PRILOSEC) 40 MG capsule Take 1 capsule (40 mg total) by mouth daily., Starting 04/23/2015, Until Discontinued, Normal    ondansetron (ZOFRAN) 4 MG tablet Take 1 tablet (4 mg total) by mouth every 6 (six)  hours., Starting 04/23/2015, Until Discontinued, Print       Allergies  Allergen Reactions  . Peanut-Containing Drug Products Swelling    Swelling of mouth, lips  . Strawberry Extract Swelling    Swelling of mouth, lips   Follow-up Information    Follow up with Vicenta Aly, FNP. Schedule an appointment as soon as possible for a visit in 1 week.   Specialty:  Nurse Practitioner   Contact information:   [address] [place] [address] [phone]        The results of significant diagnostics from this hospitalization (including imaging, microbiology, ancillary and laboratory) are listed below for reference.    Significant Diagnostic Studies: No results found.  Microbiology: No results found for this or any previous visit (from the past 240 hour(s)).   Labs: Basic Metabolic Panel:  Recent Labs Lab 05/24/15 1653 05/25/15 0531 05/25/15 1923  NA 145 134* 133*  K 3.6 3.7 3.5  CL 105 109 102  CO2 22 13* 19*  GLUCOSE 100* 143* 150*  BUN 19 12 7   CREATININE 0.84 0.54 0.38*  CALCIUM 10.9* 8.0* 8.3*   Liver Function Tests:  Recent Labs Lab 05/24/15 1653 05/25/15 1923  AST 35 18  ALT 19 11*  ALKPHOS 121 78  BILITOT 1.0 1.1  PROT 9.6* 6.6  ALBUMIN 5.5* 3.6    Recent Labs Lab 05/24/15 1653 05/25/15 1923  LIPASE 16 13   No results for input(s): AMMONIA in the last 168 hours. CBC:  Recent Labs Lab 05/24/15 1653 05/25/15 0531 05/25/15 1923  WBC 11.9* 9.4 5.2  HGB 16.5* 11.1* 12.4  HCT 47.7* 33.3* 36.4  MCV 90.9 92.5 89.2  PLT 529* 344 371   Cardiac Enzymes: No results for input(s): CKTOTAL, CKMB, CKMBINDEX, TROPONINI in the last 168 hours. BNP: BNP (last 3 results)  Recent Labs  04/11/15 0325  BNP 43.5    ProBNP (last 3 results) No results for input(s): PROBNP in the last 8760 hours.  CBG:  Recent Labs Lab 05/25/15 0009 05/25/15 0405 05/25/15 0740 05/25/15 1152 05/25/15 1922  GLUCAP 393* 117* 110* 152* 140*        SignedDomenic Polite MD.  [facility] 05/26/2015, 11:48 AM

## 2015-05-27 ENCOUNTER — Emergency Department (HOSPITAL_COMMUNITY)
Admission: EM | Admit: 2015-05-27 | Discharge: 2015-05-27 | Disposition: A | Discharge status: Home / Self Care | Payer: BLUE CROSS/BLUE SHIELD | Source: Home / Self Care | Attending: Emergency Medicine | Admitting: Emergency Medicine

## 2015-05-27 ENCOUNTER — Encounter (HOSPITAL_COMMUNITY): Payer: Self-pay | Admitting: Emergency Medicine

## 2015-05-27 DIAGNOSIS — Z79899 Other long term (current) drug therapy: Secondary | ICD-10-CM | POA: Insufficient documentation

## 2015-05-27 DIAGNOSIS — R112 Nausea with vomiting, unspecified: Secondary | ICD-10-CM

## 2015-05-27 DIAGNOSIS — Z91010 Allergy to peanuts: Secondary | ICD-10-CM

## 2015-05-27 DIAGNOSIS — E109 Type 1 diabetes mellitus without complications: Secondary | ICD-10-CM

## 2015-05-27 DIAGNOSIS — Z7951 Long term (current) use of inhaled steroids: Secondary | ICD-10-CM

## 2015-05-27 DIAGNOSIS — K3184 Gastroparesis: Secondary | ICD-10-CM

## 2015-05-27 DIAGNOSIS — Z79891 Long term (current) use of opiate analgesic: Secondary | ICD-10-CM

## 2015-05-27 DIAGNOSIS — M1990 Unspecified osteoarthritis, unspecified site: Secondary | ICD-10-CM | POA: Insufficient documentation

## 2015-05-27 DIAGNOSIS — I10 Essential (primary) hypertension: Secondary | ICD-10-CM | POA: Insufficient documentation

## 2015-05-27 DIAGNOSIS — J45909 Unspecified asthma, uncomplicated: Secondary | ICD-10-CM

## 2015-05-27 DIAGNOSIS — E1010 Type 1 diabetes mellitus with ketoacidosis without coma: Secondary | ICD-10-CM | POA: Diagnosis not present

## 2015-05-27 LAB — URINALYSIS, ROUTINE W REFLEX MICROSCOPIC
Bilirubin Urine: NEGATIVE
GLUCOSE, UA: 500 mg/dL — AB
Hgb urine dipstick: NEGATIVE
KETONES UR: 40 mg/dL — AB
Leukocytes, UA: NEGATIVE
NITRITE: NEGATIVE
PH: 7 (ref 5.0–8.0)
Protein, ur: NEGATIVE mg/dL
SPECIFIC GRAVITY, URINE: 1.01 (ref 1.005–1.030)

## 2015-05-27 LAB — COMPREHENSIVE METABOLIC PANEL
ALT: 13 U/L — AB (ref 14–54)
ANION GAP: 14 (ref 5–15)
AST: 21 U/L (ref 15–41)
Albumin: 4 g/dL (ref 3.5–5.0)
Alkaline Phosphatase: 83 U/L (ref 38–126)
BILIRUBIN TOTAL: 1.1 mg/dL (ref 0.3–1.2)
CHLORIDE: 99 mmol/L — AB (ref 101–111)
CO2: 21 mmol/L — ABNORMAL LOW (ref 22–32)
Calcium: 8.8 mg/dL — ABNORMAL LOW (ref 8.9–10.3)
Creatinine, Ser: 0.64 mg/dL (ref 0.44–1.00)
GFR calc non Af Amer: >60 mL/min (ref >60)
Glucose, Bld: 199 mg/dL — ABNORMAL HIGH (ref 65–99)
Potassium: 2.8 mmol/L — ABNORMAL LOW (ref 3.5–5.1)
SODIUM: 134 mmol/L — AB (ref 135–145)
Total Protein: 7.2 g/dL (ref 6.5–8.1)

## 2015-05-27 LAB — CBC
HEMATOCRIT: 36.7 % (ref 36.0–46.0)
HEMOGLOBIN: 12.7 g/dL (ref 12.0–15.0)
MCH: 30.6 pg (ref 26.0–34.0)
MCHC: 34.6 g/dL (ref 30.0–36.0)
MCV: 88.4 fL (ref 78.0–100.0)
Platelets: 362 10*3/uL (ref 150–400)
RBC: 4.15 MIL/uL (ref 3.87–5.11)
RDW: 12.4 % (ref 11.5–15.5)
WBC: 4.5 10*3/uL (ref 4.0–10.5)

## 2015-05-27 LAB — HEMOGLOBIN A1C
HEMOGLOBIN A1C: 9.2 % — AB (ref 4.8–5.6)
MEAN PLASMA GLUCOSE: 217 mg/dL

## 2015-05-27 LAB — CBG MONITORING, ED
GLUCOSE-CAPILLARY: 187 mg/dL — AB (ref 65–99)
Glucose-Capillary: 132 mg/dL — ABNORMAL HIGH (ref 65–99)

## 2015-05-27 LAB — LIPASE, BLOOD: Lipase: 13 U/L (ref 11–51)

## 2015-05-27 MED ORDER — POTASSIUM CHLORIDE 10 MEQ/100ML IV SOLN
10.0000 meq | Freq: Once | INTRAVENOUS | Status: AC
  Administered 2015-05-27: 10 meq via INTRAVENOUS
  Filled 2015-05-27: qty 100

## 2015-05-27 MED ORDER — NAPROXEN 500 MG PO TABS: 500.0000 mg | ORAL_TABLET | Freq: Two times a day (BID) | ORAL | Status: DC

## 2015-05-27 MED ORDER — METOCLOPRAMIDE HCL 5 MG/ML IJ SOLN
10.0000 mg | Freq: Once | INTRAMUSCULAR | Status: AC
  Administered 2015-05-27: 10 mg via INTRAVENOUS
  Filled 2015-05-27: qty 2

## 2015-05-27 MED ORDER — POTASSIUM CHLORIDE CRYS ER 20 MEQ PO TBCR
40.0000 meq | EXTENDED_RELEASE_TABLET | Freq: Once | ORAL | Status: AC
  Administered 2015-05-27: 40 meq via ORAL
  Filled 2015-05-27: qty 2

## 2015-05-27 MED ORDER — HYDROMORPHONE HCL 1 MG/ML IJ SOLN
1.0000 mg | Freq: Once | INTRAMUSCULAR | Status: AC
  Administered 2015-05-27: 1 mg via INTRAVENOUS
  Filled 2015-05-27: qty 1

## 2015-05-27 MED ORDER — METOCLOPRAMIDE HCL 10 MG PO TABS: 10.0000 mg | ORAL_TABLET | Freq: Three times a day (TID) | ORAL | Status: DC

## 2015-05-27 MED ORDER — KETOROLAC TROMETHAMINE 30 MG/ML IJ SOLN
30.0000 mg | Freq: Once | INTRAMUSCULAR | Status: AC
  Administered 2015-05-27: 30 mg via INTRAVENOUS
  Filled 2015-05-27: qty 1

## 2015-05-27 NOTE — ED Provider Notes (Signed)
CSN: [id number]     Arrival date & time 05/27/15  1139 History   First MD Initiated Contact with Patient 05/27/15 1302     Chief Complaint  Patient presents with  . Nausea  . Emesis   (Consider location/radiation/quality/duration/timing/severity/associated sxs/prior Treatment) HPI  25 y.o. female with a hx of Gastroparesis, DM Type 1, HTN presents to the Emergency Department today complaining of abdominal pain, nausea and vomiting for the past 3 days. Rates pain 10/10 and constant with no relief with medication. Noted recent visits yesterday and the day before for similar symptoms. Recently admitted to the hospital for gastroparesis with continued IV hydration and analgesia with improvement of symptoms. Pt has had symptoms in the past few months. Has scheduled GI appointment for gastric emptying study. No diarrhea. No CP/SOB. No fevers. No headaches. No other symptoms noted.    Past Medical History  Diagnosis Date  . Diabetes mellitus   . Asthma   . Pancreatitis, acute 11/26/2014  . Hepatic steatosis 11/26/2014  . Liver mass 11/26/2014  . Hypertension     NOT CURRENTLY ON ANY BP MED  . Heart murmur   . Arthritis   . Gallstones   . Anxiety    Past Surgical History  Procedure Laterality Date  . Wisdom tooth extraction    . Esophagogastroduodenoscopy (egd) with propofol Left 09/20/2014    Procedure: ESOPHAGOGASTRODUODENOSCOPY (EGD) WITH PROPOFOL;  Surgeon: Arta Silence, MD;  Location: [facility] ENDOSCOPY;  Service: Endoscopy;  Laterality: Left;  . Cholecystectomy N/A 02/11/2015    Procedure: LAPAROSCOPIC CHOLECYSTECTOMY WITH INTRAOPERATIVE CHOLANGIOGRAM;  Surgeon: Greer Pickerel, MD;  Location: [facility] ORS;  Service: General;  Laterality: N/A;   Family History  Problem Relation Age of Onset  . Diabetes Mother   . Hyperlipidemia Mother   . Hypertension Father   . Heart disease Father   . Heart disease Maternal Grandmother   . Heart disease Maternal Grandfather   . Hypertension Paternal Grandmother    . Cancer Paternal Grandfather    Social History  Substance Use Topics  . Smoking status: Never Smoker   . Smokeless tobacco: Never Used  . Alcohol Use: No   OB History    Gravida Para Term Preterm AB TAB SAB Ectopic Multiple Living   2 1 0 1 1 1 0 0 0 1      Review of Systems ROS reviewed and all are negative for acute change except as noted in the HPI.  Allergies  Peanut-containing drug products and Strawberry extract  Home Medications   Prior to Admission medications   Medication Sig Start Date End Date Taking? Authorizing Provider  albuterol (PROVENTIL HFA;VENTOLIN HFA) 108 (90 BASE) MCG/ACT inhaler Inhale 2 puffs into the lungs every 6 (six) hours as needed for shortness of breath.    Historical Provider, MD  albuterol (PROVENTIL) (2.5 MG/3ML) 0.083% nebulizer solution Take 2.5 mg by nebulization every 6 (six) hours as needed for shortness of breath.    Historical Provider, MD  budesonide-formoterol (SYMBICORT) 160-4.5 MCG/ACT inhaler Inhale 2 puffs into the lungs 2 (two) times daily.     Historical Provider, MD  EPINEPHrine (EPIPEN) 0.3 mg/0.3 mL SOAJ injection Inject 0.3 mg into the muscle once.    Historical Provider, MD  insulin aspart (NOVOLOG FLEXPEN) 100 UNIT/ML FlexPen INJECT 0-20 UNITS INTO THE SKIN AS DIRECTED. SSI - CARB SCALE, 0-20 UNITS Patient taking differently: Inject into the skin 4 (four) times daily as needed for high blood sugar. Sliding scale 10/19/13   Elayne Snare, MD  Insulin Glargine (LANTUS SOLOSTAR) 100 UNIT/ML Solostar Pen Inject 24 Units into the skin daily at 10 pm. 05/25/15   Domenic Polite, MD  metoCLOPramide (REGLAN) 10 MG tablet Take 1 tablet (10 mg total) by mouth 4 (four) times daily. 04/15/15   [name], DO  montelukast (SINGULAIR) 10 MG tablet Take 10 mg by mouth daily.    Historical Provider, MD  omeprazole (PRILOSEC) 40 MG capsule Take 1 capsule (40 mg total) by mouth daily. 04/23/15   Orson Eva, MD  ondansetron (ZOFRAN) 4 MG tablet Take 1  tablet (4 mg total) by mouth every 6 (six) hours. 04/23/15   Wallace Lions, PA-C  oxyCODONE-acetaminophen (PERCOCET/ROXICET) 5-325 MG tablet Take 1 tablet by mouth every 4 (four) hours as needed for severe pain. 05/26/15   Virgel Manifold, MD  promethazine (PHENERGAN) 25 MG tablet Take 1 tablet (25 mg total) by mouth every 6 (six) hours as needed for nausea or vomiting. 05/26/15   Samantha Tripp Dowless, PA-C   BP 151/103 mmHg  Pulse 106  Temp(Src) 97.7 F (36.5 C) (Oral)  Resp 18  Wt 51.71 kg  SpO2 100%  LMP 05/13/2015\  Physical Exam  Constitutional: She is oriented to person, place, and time. Vital signs are normal. She appears well-developed and well-nourished.  Pt actively vomiting and moaning due to pain   HENT:  Head: Normocephalic and atraumatic.  Eyes: EOM are normal. Pupils are equal, round, and reactive to light.  Neck: Normal range of motion. Neck supple. No tracheal deviation present.  Cardiovascular: Normal rate, regular rhythm, normal heart sounds and intact distal pulses.   No murmur heard. Pulmonary/Chest: Effort normal and breath sounds normal. No respiratory distress. She has no wheezes. She has no rales. She exhibits no tenderness.  Abdominal: Soft. Normal appearance and bowel sounds are normal. There is generalized tenderness. There is no rigidity, no rebound, no guarding, no tenderness at McBurney's point and negative Murphy's sign.  Abdomen soft  Musculoskeletal: Normal range of motion.  Neurological: She is alert and oriented to person, place, and time.  Skin: Skin is warm and dry.  Psychiatric: She has a normal mood and affect. Her behavior is normal. Thought content normal.  Nursing note and vitals reviewed.  ED Course  Procedures (including critical care time) Labs Review Labs Reviewed  COMPREHENSIVE METABOLIC PANEL - Abnormal; Notable for the following:    Sodium 134 (*)    Potassium 2.8 (*)    Chloride 99 (*)    CO2 21 (*)    Glucose, Bld 199 (*)     BUN <5 (*)    Calcium 8.8 (*)    ALT 13 (*)    All other components within normal limits  URINALYSIS, ROUTINE W REFLEX MICROSCOPIC (NOT AT [facility]) - Abnormal; Notable for the following:    Glucose, UA 500 (*)    Ketones, ur 40 (*)    All other components within normal limits  CBG MONITORING, ED - Abnormal; Notable for the following:    Glucose-Capillary 132 (*)    All other components within normal limits  CBG MONITORING, ED - Abnormal; Notable for the following:    Glucose-Capillary 187 (*)    All other components within normal limits  LIPASE, BLOOD  CBC   Imaging Review No results found. I have personally reviewed and evaluated these images and lab results as part of my medical decision-making.   EKG Interpretation None      MDM  I have reviewed and evaluated the  relevant laboratory values I have reviewed and evaluated the relevant imaging studies.   I have reviewed the relevant previous healthcare records.I have reviewed EMS Documentation. I obtained HPI from historian.  ED Course:  Assessment: Patient is a 24yF presents with abdominal pain x 3 days. History of the same. Recent admission for gastroparesis with improvement of symptoms. Has GI follow up for gastric emptying study. On exam, nontoxic, nonseptic appearing, in no apparent distress. Patient's pain and other symptoms adequately managed in emergency department.  Fluid bolus and analgesia given.  Labs, imaging and vitals reviewed. CT last month due to similar problem showed no acute abnormalities. I do not suspect surgical abdomen. Abdomen is soft. Hypokalemia noted. Given KDur and IV Potassium. No anion gap. Lipase unremarkable. Patient does not meet the SIRS or Sepsis criteria.  On repeat exam patient does not have a surgical abdomen and there are no peritoneal signs.  No indication of appendicitis, bowel obstruction, bowel perforation, cholecystitis, diverticulitis, PID or ectopic pregnancy. Patient discharged home with  symptomatic treatment and given strict instructions for follow-up with their primary care physician and GI.  I have also discussed reasons to return immediately to the ER.  Patient expresses understanding and agrees with plan. 2:25 PM- On reexamination, pt appearing well. Pain control with no emesis. Able to tolerate PO. Abdominal exam unremarkable on reexamination. Plan is to have pt follow up with GI and PCP for further management. Consulted with GI has scheduled for gastric Emptying Scan on Friday 03/31/15 at 1:30p with Alonza Bogus, PA-C  Disposition/Plan:  [place] Additional Verbal discharge instructions given and discussed with patient.  Pt Instructed to f/u with GI at scheduled appointment as well as PCP at scheduled appointment for evaluation and treatment of symptoms. Return precautions given Pt acknowledges and agrees with plan  Supervising Physician Charlesetta Shanks, MD   Final diagnoses:  Gastroparesis  Non-intractable vomiting with nausea, vomiting of unspecified type      Shary Decamp, PA-C 05/27/15 [place], MD 05/28/15 [phone]

## 2015-05-27 NOTE — ED Notes (Signed)
Bed: WA06 Expected date:  Expected time:  Means of arrival:  Comments: EMS 

## 2015-05-27 NOTE — ED Notes (Signed)
Pt had an incontinent episode and is unable to give urine sample at this time

## 2015-05-27 NOTE — ED Notes (Signed)
Patient nauseated with diffuse abdominal pain.  She has a diagnosis gastroparesis and has been unable to hold down a job due to her condition.  Her doctor is aware and they would like to start the process of disability as she has a daughter to care for and cannot do so.  Her aunt reports she is well for a week then her symptoms start again.  Her endocrinologist is Dr. Hartford Poli in [place].

## 2015-05-27 NOTE — Discharge Instructions (Signed)
Please read and follow all provided instructions.  Your diagnoses today include:  1. Gastroparesis   2. Non-intractable vomiting with nausea, vomiting of unspecified type    Tests performed today include:  Vital signs. See below for your results today.   Medications prescribed:   Take as prescribed   Home care instructions:  Follow any educational materials contained in this packet.  Follow-up instructions: Please follow-up with your primary care provider and gastroenterologist at your scheduled appointment for further evaluation of symptoms and treatment   Return instructions:   Please return to the Emergency Department if you do not get better, if you get worse, or new symptoms OR  - Fever (temperature greater than 101.60F)  - Bleeding that does not stop with holding pressure to the area    -Severe pain (please note that you may be more sore the day after your accident)  - Chest Pain  - Difficulty breathing  - Severe nausea or vomiting  - Inability to tolerate food and liquids  - Passing out  - Skin becoming red around your wounds  - Change in mental status (confusion or lethargy)  - New numbness or weakness     Please return if you have any other emergent concerns.  Additional Information:  Your vital signs today were: BP 103/79 mmHg   Pulse 95   Temp(Src) 97.7 F (36.5 C) (Oral)   Resp 18   Wt 51.71 kg   SpO2 100%   LMP 05/13/2015 If your blood pressure (BP) was elevated above 135/85 this visit, please have this repeated by your doctor within one month. ---------------

## 2015-05-27 NOTE — ED Notes (Signed)
Patient here from home with complaints of nausea, vomiting x3 days. Reports Zofran, phenergan, and Reglan with no relief. States that she feels better when she comes to the hospital.

## 2015-05-28 ENCOUNTER — Encounter (HOSPITAL_COMMUNITY): Payer: Self-pay | Admitting: *Deleted

## 2015-05-28 ENCOUNTER — Inpatient Hospital Stay (HOSPITAL_COMMUNITY)
Admission: EM | Admit: 2015-05-28 | Discharge: 2015-06-03 | DRG: 638 | Disposition: A | Discharge status: Home / Self Care | Payer: BLUE CROSS/BLUE SHIELD | Attending: Internal Medicine | Admitting: Internal Medicine

## 2015-05-28 DIAGNOSIS — Z79899 Other long term (current) drug therapy: Secondary | ICD-10-CM

## 2015-05-28 DIAGNOSIS — K760 Fatty (change of) liver, not elsewhere classified: Secondary | ICD-10-CM | POA: Diagnosis present

## 2015-05-28 DIAGNOSIS — E860 Dehydration: Secondary | ICD-10-CM | POA: Diagnosis not present

## 2015-05-28 DIAGNOSIS — Z794 Long term (current) use of insulin: Secondary | ICD-10-CM

## 2015-05-28 DIAGNOSIS — J4520 Mild intermittent asthma, uncomplicated: Secondary | ICD-10-CM

## 2015-05-28 DIAGNOSIS — J45909 Unspecified asthma, uncomplicated: Secondary | ICD-10-CM | POA: Diagnosis present

## 2015-05-28 DIAGNOSIS — R030 Elevated blood-pressure reading, without diagnosis of hypertension: Secondary | ICD-10-CM | POA: Diagnosis present

## 2015-05-28 DIAGNOSIS — E1043 Type 1 diabetes mellitus with diabetic autonomic (poly)neuropathy: Secondary | ICD-10-CM | POA: Diagnosis present

## 2015-05-28 DIAGNOSIS — K920 Hematemesis: Secondary | ICD-10-CM | POA: Diagnosis present

## 2015-05-28 DIAGNOSIS — R17 Unspecified jaundice: Secondary | ICD-10-CM | POA: Insufficient documentation

## 2015-05-28 DIAGNOSIS — Z91010 Allergy to peanuts: Secondary | ICD-10-CM

## 2015-05-28 DIAGNOSIS — E1065 Type 1 diabetes mellitus with hyperglycemia: Secondary | ICD-10-CM

## 2015-05-28 DIAGNOSIS — Z7951 Long term (current) use of inhaled steroids: Secondary | ICD-10-CM

## 2015-05-28 DIAGNOSIS — E1010 Type 1 diabetes mellitus with ketoacidosis without coma: Principal | ICD-10-CM | POA: Diagnosis present

## 2015-05-28 DIAGNOSIS — K2970 Gastritis, unspecified, without bleeding: Secondary | ICD-10-CM | POA: Diagnosis present

## 2015-05-28 DIAGNOSIS — I10 Essential (primary) hypertension: Secondary | ICD-10-CM | POA: Diagnosis present

## 2015-05-28 DIAGNOSIS — R110 Nausea: Secondary | ICD-10-CM

## 2015-05-28 DIAGNOSIS — R739 Hyperglycemia, unspecified: Secondary | ICD-10-CM | POA: Insufficient documentation

## 2015-05-28 DIAGNOSIS — R000 Tachycardia, unspecified: Secondary | ICD-10-CM | POA: Diagnosis present

## 2015-05-28 DIAGNOSIS — R1110 Vomiting, unspecified: Secondary | ICD-10-CM | POA: Diagnosis present

## 2015-05-28 DIAGNOSIS — IMO0002 Reserved for concepts with insufficient information to code with codable children: Secondary | ICD-10-CM | POA: Diagnosis present

## 2015-05-28 DIAGNOSIS — Z91018 Allergy to other foods: Secondary | ICD-10-CM

## 2015-05-28 DIAGNOSIS — F419 Anxiety disorder, unspecified: Secondary | ICD-10-CM | POA: Diagnosis present

## 2015-05-28 DIAGNOSIS — R109 Unspecified abdominal pain: Secondary | ICD-10-CM

## 2015-05-28 DIAGNOSIS — K3184 Gastroparesis: Secondary | ICD-10-CM | POA: Diagnosis present

## 2015-05-28 DIAGNOSIS — R112 Nausea with vomiting, unspecified: Secondary | ICD-10-CM | POA: Diagnosis present

## 2015-05-28 DIAGNOSIS — K8020 Calculus of gallbladder without cholecystitis without obstruction: Secondary | ICD-10-CM

## 2015-05-28 LAB — CBC
HEMATOCRIT: 36.1 % (ref 36.0–46.0)
Hemoglobin: 12.4 g/dL (ref 12.0–15.0)
MCH: 30.9 pg (ref 26.0–34.0)
MCHC: 34.3 g/dL (ref 30.0–36.0)
MCV: 90 fL (ref 78.0–100.0)
PLATELETS: 336 10*3/uL (ref 150–400)
RBC: 4.01 MIL/uL (ref 3.87–5.11)
RDW: 12.5 % (ref 11.5–15.5)
WBC: 7.7 10*3/uL (ref 4.0–10.5)

## 2015-05-28 LAB — COMPREHENSIVE METABOLIC PANEL
ALBUMIN: 3.7 g/dL (ref 3.5–5.0)
ALK PHOS: 82 U/L (ref 38–126)
ALT: 13 U/L — ABNORMAL LOW (ref 14–54)
AST: 19 U/L (ref 15–41)
Anion gap: 14 (ref 5–15)
BILIRUBIN TOTAL: 1.1 mg/dL (ref 0.3–1.2)
BUN: 6 mg/dL (ref 6–20)
CALCIUM: 8.7 mg/dL — AB (ref 8.9–10.3)
CO2: 22 mmol/L (ref 22–32)
CREATININE: 0.5 mg/dL (ref 0.44–1.00)
Chloride: 103 mmol/L (ref 101–111)
GFR calc Af Amer: >60 mL/min (ref >60)
GFR calc non Af Amer: >60 mL/min (ref >60)
GLUCOSE: 396 mg/dL — AB (ref 65–99)
Potassium: 3.5 mmol/L (ref 3.5–5.1)
Sodium: 139 mmol/L (ref 135–145)
TOTAL PROTEIN: 6.7 g/dL (ref 6.5–8.1)

## 2015-05-28 LAB — BLOOD GAS, VENOUS
Acid-base deficit: 1.5 mmol/L (ref 0.0–2.0)
Bicarbonate: 23.5 mEq/L (ref 20.0–24.0)
O2 Saturation: 62.2 %
PATIENT TEMPERATURE: 98.6
TCO2: 21.3 mmol/L (ref 0–100)
pCO2, Ven: 42.9 mmHg — ABNORMAL LOW (ref 45.0–50.0)
pH, Ven: 7.357 — ABNORMAL HIGH (ref 7.250–7.300)
pO2, Ven: 35 mmHg (ref 31.0–45.0)

## 2015-05-28 LAB — LIPASE, BLOOD: Lipase: 16 U/L (ref 11–51)

## 2015-05-28 LAB — MAGNESIUM: Magnesium: 1.5 mg/dL — ABNORMAL LOW (ref 1.7–2.4)

## 2015-05-28 LAB — URINALYSIS, ROUTINE W REFLEX MICROSCOPIC
BILIRUBIN URINE: NEGATIVE
Glucose, UA: >1000 mg/dL — AB
Hgb urine dipstick: NEGATIVE
Leukocytes, UA: NEGATIVE
NITRITE: NEGATIVE
PH: 6 (ref 5.0–8.0)
Protein, ur: NEGATIVE mg/dL
Specific Gravity, Urine: 1.028 (ref 1.005–1.030)

## 2015-05-28 LAB — CBG MONITORING, ED
GLUCOSE-CAPILLARY: 341 mg/dL — AB (ref 65–99)
GLUCOSE-CAPILLARY: 322 mg/dL — AB (ref 65–99)
GLUCOSE-CAPILLARY: 238 mg/dL — AB (ref 65–99)

## 2015-05-28 LAB — I-STAT BETA HCG BLOOD, ED (MC, WL, AP ONLY): I-stat hCG, quantitative: <5 m[IU]/mL (ref <5)

## 2015-05-28 LAB — URINE MICROSCOPIC-ADD ON: RBC / HPF: NONE SEEN RBC/hpf (ref 0–5)

## 2015-05-28 LAB — PHOSPHORUS: Phosphorus: 2.1 mg/dL — ABNORMAL LOW (ref 2.5–4.6)

## 2015-05-28 LAB — GLUCOSE, CAPILLARY
GLUCOSE-CAPILLARY: 235 mg/dL — AB (ref 65–99)
GLUCOSE-CAPILLARY: 185 mg/dL — AB (ref 65–99)
GLUCOSE-CAPILLARY: 119 mg/dL — AB (ref 65–99)
Glucose-Capillary: 165 mg/dL — ABNORMAL HIGH (ref 65–99)

## 2015-05-28 LAB — TSH: TSH: 0.438 u[IU]/mL (ref 0.350–4.500)

## 2015-05-28 MED ORDER — MAGNESIUM SULFATE 2 GM/50ML IV SOLN
2.0000 g | Freq: Once | INTRAVENOUS | Status: AC
  Administered 2015-05-28: 2 g via INTRAVENOUS
  Filled 2015-05-28: qty 50

## 2015-05-28 MED ORDER — PANTOPRAZOLE SODIUM 40 MG IV SOLR
40.0000 mg | Freq: Two times a day (BID) | INTRAVENOUS | Status: DC
  Administered 2015-05-28 – 2015-06-03 (×12): 40 mg via INTRAVENOUS
  Filled 2015-05-28 (×14): qty 40

## 2015-05-28 MED ORDER — INSULIN GLARGINE 100 UNIT/ML ~~LOC~~ SOLN
15.0000 [IU] | Freq: Every day | SUBCUTANEOUS | Status: DC
  Administered 2015-05-28: 15 [IU] via SUBCUTANEOUS
  Filled 2015-05-28 (×2): qty 0.15

## 2015-05-28 MED ORDER — BUDESONIDE 0.25 MG/2ML IN SUSP
0.2500 mg | Freq: Two times a day (BID) | RESPIRATORY_TRACT | Status: DC
  Administered 2015-05-28 – 2015-05-29 (×2): 0.25 mg via RESPIRATORY_TRACT
  Filled 2015-05-28 (×3): qty 2

## 2015-05-28 MED ORDER — OXYCODONE-ACETAMINOPHEN 5-325 MG PO TABS
1.0000 | ORAL_TABLET | ORAL | Status: DC | PRN
  Administered 2015-05-28 – 2015-06-01 (×2): 1 via ORAL
  Filled 2015-05-28 (×3): qty 1

## 2015-05-28 MED ORDER — POTASSIUM CHLORIDE 2 MEQ/ML IV SOLN
INTRAVENOUS | Status: DC
  Administered 2015-05-28 – 2015-05-29 (×4): via INTRAVENOUS
  Filled 2015-05-28 (×11): qty 1000

## 2015-05-28 MED ORDER — SODIUM CHLORIDE 0.9 % IV SOLN
INTRAVENOUS | Status: DC
  Administered 2015-05-28: 2.6 [IU]/h via INTRAVENOUS
  Filled 2015-05-28: qty 2.5

## 2015-05-28 MED ORDER — PANTOPRAZOLE SODIUM 40 MG IV SOLR
40.0000 mg | Freq: Once | INTRAVENOUS | Status: AC
  Administered 2015-05-28: 40 mg via INTRAVENOUS
  Filled 2015-05-28: qty 40

## 2015-05-28 MED ORDER — ONDANSETRON HCL 4 MG/2ML IJ SOLN
4.0000 mg | Freq: Once | INTRAMUSCULAR | Status: AC | PRN
  Administered 2015-05-28: 4 mg via INTRAVENOUS
  Filled 2015-05-28: qty 2

## 2015-05-28 MED ORDER — MORPHINE SULFATE (PF) 2 MG/ML IV SOLN
2.0000 mg | INTRAVENOUS | Status: DC | PRN
  Administered 2015-05-28 – 2015-06-01 (×18): 2 mg via INTRAVENOUS
  Filled 2015-05-28 (×18): qty 1

## 2015-05-28 MED ORDER — METOCLOPRAMIDE HCL 5 MG/ML IJ SOLN
10.0000 mg | Freq: Four times a day (QID) | INTRAMUSCULAR | Status: DC
  Administered 2015-05-28 – 2015-06-03 (×24): 10 mg via INTRAVENOUS
  Filled 2015-05-28 (×37): qty 2

## 2015-05-28 MED ORDER — ONDANSETRON HCL 4 MG/2ML IJ SOLN
4.0000 mg | Freq: Four times a day (QID) | INTRAMUSCULAR | Status: DC | PRN
  Administered 2015-05-28 – 2015-06-01 (×12): 4 mg via INTRAVENOUS
  Filled 2015-05-28 (×12): qty 2

## 2015-05-28 MED ORDER — ONDANSETRON HCL 4 MG PO TABS: 4.0000 mg | ORAL_TABLET | Freq: Four times a day (QID) | ORAL | Status: DC | PRN

## 2015-05-28 MED ORDER — ACETAMINOPHEN 650 MG RE SUPP: 650.0000 mg | Freq: Four times a day (QID) | RECTAL | Status: DC | PRN

## 2015-05-28 MED ORDER — ACETAMINOPHEN 325 MG PO TABS: 650.0000 mg | ORAL_TABLET | Freq: Four times a day (QID) | ORAL | Status: DC | PRN

## 2015-05-28 MED ORDER — HYDRALAZINE HCL 20 MG/ML IJ SOLN: 10.0000 mg | Freq: Three times a day (TID) | INTRAMUSCULAR | Status: DC | PRN

## 2015-05-28 MED ORDER — KCL-LACTATED RINGERS 20 MEQ/L IV SOLN
INTRAVENOUS | Status: DC
  Filled 2015-05-28: qty 1000

## 2015-05-28 MED ORDER — SODIUM CHLORIDE 0.9 % IV BOLUS (SEPSIS)
1000.0000 mL | Freq: Once | INTRAVENOUS | Status: AC
Start: 2015-05-28 — End: 2015-05-28
  Administered 2015-05-28: 1000 mL via INTRAVENOUS

## 2015-05-28 MED ORDER — INSULIN ASPART 100 UNIT/ML ~~LOC~~ SOLN
0.0000 [IU] | Freq: Three times a day (TID) | SUBCUTANEOUS | Status: DC
  Administered 2015-05-28: 3 [IU] via SUBCUTANEOUS
  Administered 2015-05-29: 1 [IU] via SUBCUTANEOUS
  Administered 2015-05-29: 3 [IU] via SUBCUTANEOUS
  Administered 2015-05-29: 5 [IU] via SUBCUTANEOUS

## 2015-05-28 MED ORDER — HYDROMORPHONE HCL 1 MG/ML IJ SOLN
1.0000 mg | Freq: Once | INTRAMUSCULAR | Status: AC
  Administered 2015-05-28: 1 mg via INTRAVENOUS
  Filled 2015-05-28: qty 1

## 2015-05-28 MED ORDER — ONDANSETRON 4 MG PO TBDP: 4.0000 mg | ORAL_TABLET | Freq: Once | ORAL | Status: DC | PRN

## 2015-05-28 MED ORDER — ALBUTEROL SULFATE (2.5 MG/3ML) 0.083% IN NEBU: 2.5000 mg | INHALATION_SOLUTION | Freq: Four times a day (QID) | RESPIRATORY_TRACT | Status: DC | PRN

## 2015-05-28 MED ORDER — CHLORHEXIDINE GLUCONATE 0.12 % MT SOLN
15.0000 mL | Freq: Two times a day (BID) | OROMUCOSAL | Status: DC
  Administered 2015-05-29 – 2015-06-03 (×8): 15 mL via OROMUCOSAL
  Filled 2015-05-28 (×14): qty 15

## 2015-05-28 MED ORDER — SODIUM CHLORIDE 0.9 % IV BOLUS (SEPSIS)
1000.0000 mL | Freq: Once | INTRAVENOUS | Status: AC
  Administered 2015-05-28: 1000 mL via INTRAVENOUS

## 2015-05-28 MED ORDER — ONDANSETRON HCL 4 MG/2ML IJ SOLN: 4.0000 mg | Freq: Four times a day (QID) | INTRAMUSCULAR | Status: DC | PRN

## 2015-05-28 MED ORDER — METOCLOPRAMIDE HCL 5 MG/ML IJ SOLN
10.0000 mg | Freq: Once | INTRAMUSCULAR | Status: AC
  Administered 2015-05-28: 10 mg via INTRAVENOUS
  Filled 2015-05-28: qty 2

## 2015-05-28 NOTE — ED Notes (Signed)
Bed: WA12 Expected date:  Expected time:  Means of arrival:  Comments: Triage 1  

## 2015-05-28 NOTE — ED Notes (Signed)
Pt's great aunt reports pt was seen for same yesterday and was discharged home.  Pt was "passed out" while in wheelchair, drooling.  Pt became alert after painful stimuli.  Pt then started to groan and moan loudly and started to vomit.  Pt has hx of gastroparesis.  +hematemesis.  Denies having this yesterday.

## 2015-05-28 NOTE — ED Notes (Signed)
Writer attempted x 2 to obtain blood without successful. Gwyndolyn Saxon, lead tech notified of need.

## 2015-05-28 NOTE — H&P (Signed)
History and Physical    [name] [id number] DOB: [birth year] DOA: 05/28/2015  Referring Provider: Dr. Alfonse Spruce  PCP: Vicenta Aly, [place]    Patient coming from: Home  Chief Complaint: Abdominal pain, intractable nausea and vomiting, hematemesis  HPI: [name] is a 25 y.o. female with PMH significant for uncontrolled type 1 diabetes mellitus, asthma, hypertension (not currently on any antihypertensive agent), GERD and presumed gastroparesis; who presented to the emergency department secondary to abdominal pain, intractable nausea/vomiting, dehydration and hematemesis. Patient reports that her symptoms have been present for the last 3-4 days now and having difficulty to take any medications, food or liquids. For the last 40 hours she has been essentially vomiting constantly; and had required to come to the emergency department for treatment; every time she was rehydrated, received antiemetics medications and discharged home. Today patient endorses ongoing symptoms and after multiple vomiting episodes started to notice blood in her emesis. Patient denies chest pain, shortness of breath, fever, chills, dysuria, hematuria, melena, hematochezia or any other acute complaints.  ED Course: Patient received IV fluids, antiemetics and blood work were check; which where essentially stable except for hyperglycemia. No elevated anion gap and normal bicarbonate. Due to hematemesis episodes, [facility] hospitalist has been called to admit the patient for further evaluation and treatment.  Review of Systems:  All other systems reviewed and apart from HPI, are negative.  Past Medical History  Diagnosis Date  . Diabetes mellitus   . Asthma   . Pancreatitis, acute 11/26/2014  . Hepatic steatosis 11/26/2014  . Liver mass 11/26/2014  . Hypertension     NOT CURRENTLY ON ANY BP MED  . Heart murmur   . Arthritis   . Gallstones   . Anxiety     Past Surgical History  Procedure Laterality Date  .  Wisdom tooth extraction    . Esophagogastroduodenoscopy (egd) with propofol Left 09/20/2014    Procedure: ESOPHAGOGASTRODUODENOSCOPY (EGD) WITH PROPOFOL;  Surgeon: Arta Silence, MD;  Location: [facility] ENDOSCOPY;  Service: Endoscopy;  Laterality: Left;  . Cholecystectomy N/A 02/11/2015    Procedure: LAPAROSCOPIC CHOLECYSTECTOMY WITH INTRAOPERATIVE CHOLANGIOGRAM;  Surgeon: Greer Pickerel, MD;  Location: [facility] ORS;  Service: General;  Laterality: N/A;     reports that she has never smoked. She has never used smokeless tobacco. She reports that she uses illicit drugs (Marijuana). She reports that she does not drink alcohol.  Allergies  Allergen Reactions  . Peanut-Containing Drug Products Swelling    Swelling of mouth, lips  . Strawberry Extract Swelling    Swelling of mouth, lips    Family History  Problem Relation Age of Onset  . Diabetes Mother   . Hyperlipidemia Mother   . Hypertension Father   . Heart disease Father   . Heart disease Maternal Grandmother   . Heart disease Maternal Grandfather   . Hypertension Paternal Grandmother   . Cancer Paternal Grandfather      Prior to Admission medications   Medication Sig Start Date End Date Taking? Authorizing Provider  albuterol (PROVENTIL HFA;VENTOLIN HFA) 108 (90 BASE) MCG/ACT inhaler Inhale 2 puffs into the lungs every 6 (six) hours as needed for shortness of breath.   Yes Historical Provider, MD  albuterol (PROVENTIL) (2.5 MG/3ML) 0.083% nebulizer solution Take 2.5 mg by nebulization every 6 (six) hours as needed for shortness of breath.   Yes Historical Provider, MD  budesonide-formoterol (SYMBICORT) 160-4.5 MCG/ACT inhaler Inhale 2 puffs into the lungs 2 (two) times daily.    Yes Historical Provider, MD  EPINEPHrine (  EPIPEN) 0.3 mg/0.3 mL SOAJ injection Inject 0.3 mg into the muscle once. As needed for allergic reaction   Yes Historical Provider, MD  insulin aspart (NOVOLOG FLEXPEN) 100 UNIT/ML FlexPen INJECT 0-20 UNITS INTO THE SKIN AS  DIRECTED. SSI - CARB SCALE, 0-20 UNITS Patient taking differently: Inject into the skin 4 (four) times daily as needed for high blood sugar. Sliding scale 10/19/13  Yes Elayne Snare, MD  Insulin Glargine (LANTUS SOLOSTAR) 100 UNIT/ML Solostar Pen Inject 24 Units into the skin daily at 10 pm. 05/25/15  Yes Domenic Polite, MD  metoCLOPramide (REGLAN) 10 MG tablet Take 1 tablet (10 mg total) by mouth 3 (three) times daily before meals. 05/27/15  Yes Shary Decamp, PA-C  montelukast (SINGULAIR) 10 MG tablet Take 10 mg by mouth daily.   Yes Historical Provider, MD  omeprazole (PRILOSEC) 40 MG capsule Take 1 capsule (40 mg total) by mouth daily. 04/23/15  Yes Orson Eva, MD  ondansetron (ZOFRAN) 4 MG tablet Take 1 tablet (4 mg total) by mouth every 6 (six) hours. 04/23/15  Yes Teague Lions, PA-C  oxyCODONE-acetaminophen (PERCOCET/ROXICET) 5-325 MG tablet Take 1 tablet by mouth every 4 (four) hours as needed for severe pain. 05/26/15  Yes Virgel Manifold, MD  promethazine (PHENERGAN) 25 MG tablet Take 1 tablet (25 mg total) by mouth every 6 (six) hours as needed for nausea or vomiting. 05/26/15  Yes Samantha Tripp Dowless, PA-C    Physical Exam: Filed Vitals:   05/28/15 1445  BP: 139/100  Pulse: 97  Temp: 98.5 F (36.9 C)  TempSrc: Oral  Resp: 20  Height: 5\' 4"  (1.626 m)  Weight: 51.7 kg (113 lb 15.7 oz)  SpO2: 100%    Constitutional: Patient was calm and in no acute distress. Currently without active vomiting but complaining of nausea. She denies chest pain and shortness of breath. Alert, awake and oriented 3; able to follow commands and answer questions appropriately. Eyes: PERRLA, lids and conjunctivae normal, no icterus, no nystagmus ENMT: Mucous membranes dry on exam. Posterior pharynx clear of any exudate or lesions. Normal dentition. No thrush  Neck: normal, supple, no masses, no thyromegaly, no JVD Respiratory: clear to auscultation bilaterally, no wheezing, no crackles. Normal respiratory effort.  No accessory muscle use.  Cardiovascular: S1 & S2 heard, mild tachycardia, sinus rhythm; soft systolic ejection murmur, no rubs or gallops..  Abdomen: No distension, tender to palpation diffusely; no guarding, no rebound. Positive bowel sounds.  Musculoskeletal: no clubbing / cyanosis. No joint deformity upper and lower extremities. Good ROM, no contractures. Normal muscle tone.  Skin: no rashes, lesions, ulcers. No induration Neurologic: CN 2-12 grossly intact. Sensation intact, DTR normal. Strength 5/5 in all 4 limbs.  Psychiatric: Normal judgment and insight. Alert and oriented x 3. Normal mood.    Labs on Admission: I have personally reviewed following labs and imaging studies  CBC:  Recent Labs Lab 05/25/15 0531 05/25/15 1923 05/26/15 1315 05/27/15 1231 05/28/15 1034  WBC 9.4 5.2 5.2 4.5 7.7  NEUTROABS  --   --  3.5  --   --   HGB 11.1* 12.4 11.4* 12.7 12.4  HCT 33.3* 36.4 33.1* 36.7 36.1  MCV 92.5 89.2 88.0 88.4 90.0  PLT 344 371 320 362 [id number]   Basic Metabolic Panel:  Recent Labs Lab 05/25/15 0531 05/25/15 1923 05/26/15 1315 05/27/15 1231 05/28/15 1034  NA 134* 133* 138 134* 139  K 3.7 3.5 3.4* 2.8* 3.5  CL 109 102 105 99* 103  CO2 13* 19*  21* 21* 22  GLUCOSE 143* 150* 125* 199* 396*  BUN 12 7 <5* <5* 6  CREATININE 0.54 0.38* 0.49 0.64 0.50  CALCIUM 8.0* 8.3* 8.2* 8.8* 8.7*   GFR: Estimated Creatinine Clearance: 87.7 mL/min (by C-G formula based on Cr of 0.5). Liver Function Tests:  Recent Labs Lab 05/24/15 1653 05/25/15 1923 05/26/15 1315 05/27/15 1231 05/28/15 1034  AST 35 18 17 21 19   ALT 19 11* 13* 13* 13*  ALKPHOS 121 78 73 83 82  BILITOT 1.0 1.1 1.3* 1.1 1.1  PROT 9.6* 6.6 6.3* 7.2 6.7  ALBUMIN 5.5* 3.6 3.4* 4.0 3.7    Recent Labs Lab 05/24/15 1653 05/25/15 1923 05/26/15 1315 05/27/15 1231 05/28/15 1034  LIPASE 16 13 12 13 16    CBG:  Recent Labs Lab 05/27/15 1220 05/28/15 0926 05/28/15 1201 05/28/15 1338 05/28/15 1427    GLUCAP 187* 341* 322* 238* 165*   Urine analysis:    Component Value Date/Time   COLORURINE YELLOW 05/28/2015 [place] 05/28/2015 1145   LABSPEC 1.028 05/28/2015 1145   PHURINE 6.0 05/28/2015 1145   GLUCOSEU >1000* 05/28/2015 1145   GLUCOSEU >=1000 11/07/2012 1205   HGBUR NEGATIVE 05/28/2015 1145   BILIRUBINUR NEGATIVE 05/28/2015 1145   KETONESUR >80* 05/28/2015 1145   PROTEINUR NEGATIVE 05/28/2015 1145   UROBILINOGEN 0.2 11/24/2014 1045   NITRITE NEGATIVE 05/28/2015 1145   LEUKOCYTESUR NEGATIVE 05/28/2015 1145   Radiological Exams on Admission: No results found.  EKG:  None  Assessment/Plan 1-abdominal pain, intractable nausea and vomiting: Appears to be secondary to gastroparesis -Patient will be admitted for pain control -As needed antiemetics and is scheduled Reglan -Nothing by mouth currently for bowel rest -Giving concerns for potential ulcers or any other causes for intractable pain in her abdomen GI has been consulted (patient might benefit of EGD). -Will use PPI twice a day through her pains currently. -Normal lipase  2-hematemesis: Patient reporting several episodes of vomiting in the last two days; raising suspicious for Mallory-Weiss  -Patient will be placed on IV PPI  -Will provide fluid resuscitation and as needed antiemetics  -GI was consulted for advice on potential need for EGD   3-Uncontrolled type 1 diabetes mellitus ([place]) -Most recent A1c 9.2 -Patient will be placed on a sliding scale insulin and reduce dose of Lantus since she will be nothing by mouth -CBGs every 4 hours. -Normal bicarbonate and normal anion gap.  4-Asthma: -Currently no wheezing and with good air movement. -Will continue as needed nebulizer treatment. -Continue schedule Pulmicort  5-Dehydration: -Due to ongoing GI losses and inability to keep food and liquids down -Will provide fluid resuscitation -As needed antiemetics  6-Hypertension: -Patient was not  receiving any antihypertensive regimen prior to admission -Will use as needed hydralazine -Monitor vital signs  DVT prophylaxis: SCDs  Code Status: Full code Family Communication: No family at bedside  Disposition Plan: Anticipate discharge home once medically stable Consults called: GI consult Velora Heckler and [place]; they would decide who will see patient in am) Admission status: Observation, MedSurg, length of stay < 2 midnights  Barton Dubois MD [facility] Pager [phone]  If 7PM-7AM, please contact night-coverage [email or web address] Password [email or web address]  05/28/2015, 4:12 PM

## 2015-05-28 NOTE — ED Provider Notes (Signed)
CSN: [id number]     Arrival date & time 05/28/15  [id number] History   First MD Initiated Contact with Patient 05/28/15 [phone]     Chief Complaint  Patient presents with  . Abdominal Pain  . Emesis     (Consider location/radiation/quality/duration/timing/severity/associated sxs/prior Treatment) HPI Comments: 25 year old female with history of type 1 diabetes, gastroparesis presents for uncontrolled vomiting. The patient was seen here yesterday for the same. She was treated with pain medication and antiemetics and she said the symptoms did improve although they never went away. She was vomiting throughout the night. This morning she began vomiting blood. She reports diffuse abdominal pain. She says that she took her insulin this morning but her blood sugar is still elevated over 340. Her great aunt at the bedside said that she came over to the house this morning and was just the patient and her 68-year-old daughter at home. This is an ongoing issue that is episodic for the patient.  Patient is a 25 y.o. female presenting with abdominal pain and vomiting.  Abdominal Pain Associated symptoms: nausea and vomiting   Associated symptoms: no chest pain, no chills, no constipation, no cough, no diarrhea, no dysuria, no fatigue, no fever, no hematuria and no shortness of breath   Emesis Associated symptoms: abdominal pain   Associated symptoms: no chills, no diarrhea, no headaches and no myalgias     Past Medical History  Diagnosis Date  . Diabetes mellitus   . Asthma   . Pancreatitis, acute 11/26/2014  . Hepatic steatosis 11/26/2014  . Liver mass 11/26/2014  . Hypertension     NOT CURRENTLY ON ANY BP MED  . Heart murmur   . Arthritis   . Gallstones   . Anxiety    Past Surgical History  Procedure Laterality Date  . Wisdom tooth extraction    . Esophagogastroduodenoscopy (egd) with propofol Left 09/20/2014    Procedure: ESOPHAGOGASTRODUODENOSCOPY (EGD) WITH PROPOFOL;  Surgeon: Arta Silence, MD;   Location: [facility] ENDOSCOPY;  Service: Endoscopy;  Laterality: Left;  . Cholecystectomy N/A 02/11/2015    Procedure: LAPAROSCOPIC CHOLECYSTECTOMY WITH INTRAOPERATIVE CHOLANGIOGRAM;  Surgeon: Greer Pickerel, MD;  Location: [facility] ORS;  Service: General;  Laterality: N/A;   Family History  Problem Relation Age of Onset  . Diabetes Mother   . Hyperlipidemia Mother   . Hypertension Father   . Heart disease Father   . Heart disease Maternal Grandmother   . Heart disease Maternal Grandfather   . Hypertension Paternal Grandmother   . Cancer Paternal Grandfather    Social History  Substance Use Topics  . Smoking status: Never Smoker   . Smokeless tobacco: Never Used  . Alcohol Use: No   OB History    Gravida Para Term Preterm AB TAB SAB Ectopic Multiple Living   2 1 0 1 1 1 0 0 0 1      Review of Systems  Constitutional: Negative for fever, chills and fatigue.  HENT: Negative for congestion, postnasal drip and rhinorrhea.   Eyes: Negative for visual disturbance.  Respiratory: Negative for cough, chest tightness and shortness of breath.   Cardiovascular: Negative for chest pain and palpitations.  Gastrointestinal: Positive for nausea, vomiting and abdominal pain. Negative for diarrhea and constipation.  Genitourinary: Negative for dysuria, urgency and hematuria.  Musculoskeletal: Negative for myalgias and back pain.  Skin: Negative for rash.  Neurological: Negative for dizziness, weakness, light-headedness and headaches.  Hematological: Does not bruise/bleed easily.      Allergies  Peanut-containing drug products and  Strawberry extract  Home Medications   Prior to Admission medications   Medication Sig Start Date End Date Taking? Authorizing Provider  albuterol (PROVENTIL HFA;VENTOLIN HFA) 108 (90 BASE) MCG/ACT inhaler Inhale 2 puffs into the lungs every 6 (six) hours as needed for shortness of breath.   Yes Historical Provider, MD  albuterol (PROVENTIL) (2.5 MG/3ML) 0.083% nebulizer  solution Take 2.5 mg by nebulization every 6 (six) hours as needed for shortness of breath.   Yes Historical Provider, MD  budesonide-formoterol (SYMBICORT) 160-4.5 MCG/ACT inhaler Inhale 2 puffs into the lungs 2 (two) times daily.    Yes Historical Provider, MD  EPINEPHrine (EPIPEN) 0.3 mg/0.3 mL SOAJ injection Inject 0.3 mg into the muscle once. As needed for allergic reaction   Yes Historical Provider, MD  insulin aspart (NOVOLOG FLEXPEN) 100 UNIT/ML FlexPen INJECT 0-20 UNITS INTO THE SKIN AS DIRECTED. SSI - CARB SCALE, 0-20 UNITS Patient taking differently: Inject into the skin 4 (four) times daily as needed for high blood sugar. Sliding scale 10/19/13  Yes Elayne Snare, MD  Insulin Glargine (LANTUS SOLOSTAR) 100 UNIT/ML Solostar Pen Inject 24 Units into the skin daily at 10 pm. 05/25/15  Yes Domenic Polite, MD  metoCLOPramide (REGLAN) 10 MG tablet Take 1 tablet (10 mg total) by mouth 3 (three) times daily before meals. 05/27/15  Yes Shary Decamp, PA-C  montelukast (SINGULAIR) 10 MG tablet Take 10 mg by mouth daily.   Yes Historical Provider, MD  omeprazole (PRILOSEC) 40 MG capsule Take 1 capsule (40 mg total) by mouth daily. 04/23/15  Yes Orson Eva, MD  ondansetron (ZOFRAN) 4 MG tablet Take 1 tablet (4 mg total) by mouth every 6 (six) hours. 04/23/15  Yes Rowley Lions, PA-C  oxyCODONE-acetaminophen (PERCOCET/ROXICET) 5-325 MG tablet Take 1 tablet by mouth every 4 (four) hours as needed for severe pain. 05/26/15  Yes Virgel Manifold, MD  promethazine (PHENERGAN) 25 MG tablet Take 1 tablet (25 mg total) by mouth every 6 (six) hours as needed for nausea or vomiting. 05/26/15  Yes Samantha Tripp Dowless, PA-C   BP 115/82 mmHg  Pulse 97  Temp(Src) 99.1 F (37.3 C) (Oral)  Resp 19  Ht 5\' 4"  (1.626 m)  Wt 113 lb 15.7 oz (51.7 kg)  BMI 19.55 kg/m2  SpO2 100%  LMP 05/13/2015 Physical Exam  Constitutional: She is oriented to person, place, and time. She appears distressed (actively vomiting with  hematemesis).  HENT:  Head: Normocephalic and atraumatic.  Right Ear: External ear normal.  Left Ear: External ear normal.  Nose: Nose normal.  Mouth/Throat: Oropharynx is clear and moist. No oropharyngeal exudate.  Eyes: EOM are normal. Pupils are equal, round, and reactive to light.  Neck: Normal range of motion. Neck supple.  Cardiovascular: Regular rhythm, normal heart sounds and intact distal pulses.  Tachycardia present.   No murmur heard. Pulmonary/Chest: Effort normal. No respiratory distress. She has no wheezes. She has no rales.  Abdominal: Soft. She exhibits no distension. There is tenderness (diffuse without focal tenderness).  Musculoskeletal: Normal range of motion. She exhibits no edema or tenderness.  Neurological: She is alert and oriented to person, place, and time.  Skin: Skin is warm. No rash noted. She is diaphoretic.  Vitals reviewed.   ED Course  Procedures (including critical care time)  CRITICAL CARE Performed by: Earlie Server   Total critical care time: 35 minutes  Critical care time was exclusive of separately billable procedures and treating other patients.  Critical care was necessary to treat  or prevent imminent or life-threatening deterioration.  Critical care was time spent personally by me on the following activities: development of treatment plan with patient and/or surrogate as well as nursing, discussions with consultants, evaluation of patient's response to treatment, examination of patient, obtaining history from patient or surrogate, ordering and performing treatments and interventions, ordering and review of laboratory studies, ordering and review of radiographic studies, pulse oximetry and re-evaluation of patient's condition.   Labs Review Labs Reviewed  COMPREHENSIVE METABOLIC PANEL - Abnormal; Notable for the following:    Glucose, Bld 396 (*)    Calcium 8.7 (*)    ALT 13 (*)    All other components within normal limits   URINALYSIS, ROUTINE W REFLEX MICROSCOPIC (NOT AT [facility]) - Abnormal; Notable for the following:    Glucose, UA >1000 (*)    Ketones, ur >80 (*)    All other components within normal limits  BLOOD GAS, VENOUS - Abnormal; Notable for the following:    pH, Ven 7.357 (*)    pCO2, Ven 42.9 (*)    All other components within normal limits  URINE MICROSCOPIC-ADD ON - Abnormal; Notable for the following:    Squamous Epithelial / LPF 0-5 (*)    Bacteria, UA RARE (*)    All other components within normal limits  GLUCOSE, CAPILLARY - Abnormal; Notable for the following:    Glucose-Capillary 165 (*)    All other components within normal limits  MAGNESIUM - Abnormal; Notable for the following:    Magnesium 1.5 (*)    All other components within normal limits  PHOSPHORUS - Abnormal; Notable for the following:    Phosphorus 2.1 (*)    All other components within normal limits  GLUCOSE, CAPILLARY - Abnormal; Notable for the following:    Glucose-Capillary 235 (*)    All other components within normal limits  GLUCOSE, CAPILLARY - Abnormal; Notable for the following:    Glucose-Capillary 185 (*)    All other components within normal limits  CBG MONITORING, ED - Abnormal; Notable for the following:    Glucose-Capillary 341 (*)    All other components within normal limits  CBG MONITORING, ED - Abnormal; Notable for the following:    Glucose-Capillary 322 (*)    All other components within normal limits  CBG MONITORING, ED - Abnormal; Notable for the following:    Glucose-Capillary 238 (*)    All other components within normal limits  LIPASE, BLOOD  CBC  TSH  BASIC METABOLIC PANEL  CBC  I-STAT BETA HCG BLOOD, ED ([facility], [facility], [facility] ONLY)    Imaging Review No results found. I have personally reviewed and evaluated these images and lab results as part of my medical decision-making.   EKG Interpretation None      MDM  Patient was seen and evaluated at bedside with great aunt present.  Patient  in obvious discomfort, actively vomiting with blood in her vomit.  History concerning for MAllory Weiss tear.  Labs showed hyperglycemia without acidosis.  In light of her inability tolerate PO patient was started on IV insulin to control glucose.  She was given multiple antiemetics, pain medication, protonix without control or relief of symptoms.  In light of inability to tolerate PO, abnormal labs, hematemesis, case was discussed with Dr. Dyann Kief who agreed with admission and the patient was admitted under his care. Final diagnoses:  Intractable vomiting with nausea, vomiting of unspecified type  Hyperglycemia  Hematemesis with nausea    1. Gastroparesis  2.  Intractable vomiting  3. Hematemesis  4. Hyperlgycemia    Harvel Quale, MD 05/28/15 [phone]

## 2015-05-29 ENCOUNTER — Encounter (HOSPITAL_COMMUNITY): Payer: Self-pay | Admitting: Physician Assistant

## 2015-05-29 DIAGNOSIS — R1110 Vomiting, unspecified: Secondary | ICD-10-CM | POA: Diagnosis not present

## 2015-05-29 DIAGNOSIS — R1084 Generalized abdominal pain: Secondary | ICD-10-CM | POA: Diagnosis not present

## 2015-05-29 DIAGNOSIS — E1065 Type 1 diabetes mellitus with hyperglycemia: Secondary | ICD-10-CM

## 2015-05-29 DIAGNOSIS — R17 Unspecified jaundice: Secondary | ICD-10-CM | POA: Diagnosis not present

## 2015-05-29 DIAGNOSIS — K920 Hematemesis: Secondary | ICD-10-CM | POA: Diagnosis not present

## 2015-05-29 DIAGNOSIS — E1010 Type 1 diabetes mellitus with ketoacidosis without coma: Secondary | ICD-10-CM | POA: Diagnosis not present

## 2015-05-29 DIAGNOSIS — E860 Dehydration: Secondary | ICD-10-CM | POA: Diagnosis not present

## 2015-05-29 LAB — GLUCOSE, CAPILLARY
GLUCOSE-CAPILLARY: 81 mg/dL (ref 65–99)
GLUCOSE-CAPILLARY: 209 mg/dL — AB (ref 65–99)
Glucose-Capillary: 147 mg/dL — ABNORMAL HIGH (ref 65–99)
Glucose-Capillary: 287 mg/dL — ABNORMAL HIGH (ref 65–99)
Glucose-Capillary: 230 mg/dL — ABNORMAL HIGH (ref 65–99)

## 2015-05-29 LAB — BASIC METABOLIC PANEL
Anion gap: 13 (ref 5–15)
CALCIUM: 8.5 mg/dL — AB (ref 8.9–10.3)
CO2: 23 mmol/L (ref 22–32)
CREATININE: 0.45 mg/dL (ref 0.44–1.00)
Chloride: 103 mmol/L (ref 101–111)
GFR calc non Af Amer: >60 mL/min (ref >60)
GLUCOSE: 108 mg/dL — AB (ref 65–99)
Potassium: 3.7 mmol/L (ref 3.5–5.1)
SODIUM: 139 mmol/L (ref 135–145)

## 2015-05-29 LAB — CBC
HCT: 33.8 % — ABNORMAL LOW (ref 36.0–46.0)
Hemoglobin: 11.7 g/dL — ABNORMAL LOW (ref 12.0–15.0)
MCH: 30.7 pg (ref 26.0–34.0)
MCHC: 34.6 g/dL (ref 30.0–36.0)
MCV: 88.7 fL (ref 78.0–100.0)
PLATELETS: 351 10*3/uL (ref 150–400)
RBC: 3.81 MIL/uL — ABNORMAL LOW (ref 3.87–5.11)
RDW: 12.6 % (ref 11.5–15.5)
WBC: 4.7 10*3/uL (ref 4.0–10.5)

## 2015-05-29 MED ORDER — INSULIN GLARGINE 100 UNIT/ML ~~LOC~~ SOLN
20.0000 [IU] | Freq: Every day | SUBCUTANEOUS | Status: DC
  Administered 2015-05-29: 20 [IU] via SUBCUTANEOUS
  Filled 2015-05-29: qty 0.2

## 2015-05-29 MED ORDER — MOMETASONE FURO-FORMOTEROL FUM 200-5 MCG/ACT IN AERO
2.0000 | INHALATION_SPRAY | Freq: Two times a day (BID) | RESPIRATORY_TRACT | Status: DC
  Filled 2015-05-29 (×2): qty 8.8

## 2015-05-29 NOTE — Progress Notes (Signed)
PROGRESS NOTE  [name] [id number] DOB: 1990/02/28 DOA: 05/28/2015 PCP: Vicenta Aly, FNP  Brief History:  25 year old female with a history of diabetes mellitus type 1, recent pancreatitis, liver adenomas, anxiety, hepatic steatosis, asthma presented with 2-3 day history of nausea, vomiting.  The patient has had numerous admissions in the past 6 months secondary to DKA as well as for N/V. Most recently, the patient was discharged after an admission from 03/21/2015 through 03/25/2015 and an admission from 04/08/2015 through 04/15/2015 for DKA.  She was also recently discharged on 05/26/2015 for treatment of intractable nausea and vomiting. During her admissions, it has been characteristic for slow recovery due to slow recovering metabolic acidosis which is initially gapped and subsequently nongapped. In addition, her admissions have been characterized by profound volume depletion requiring significant fluid resuscitation which subsequently resulted in improvement in the patient's sinus tachycardia and metabolic acidosis. Finally, her previous admissions have been characterized by opioid dependence for her "abdominal pain"which has resulted in exacerbation of her presumed gastroparesis resulting in slow recovery of her nausea and vomiting.  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 -Patient had same complaint on numerous  previous admissions -09/23/2014 EGD--diffuse gastritis without clear explanation of the patient's hematemesis -Although the patient has dilution drop in her hemoglobin, her hemoglobin overall remained stable when compared to her multiple admissions -Continue to trend hemoglobin--stable -Baseline hemoglobin 11-12  DM type I, uncontrolled -continue sensitive SSI -Continue fluid resuscitation -04/13/2015 hemoglobin A1c 9.3  -03/22/2015 UDS positive for opiates -increase Lantus to 20 units  Asthma -No wheezing or stridor -Stable on room air -Continue LABA -continue singulair  Sinus tachycardia/Dehydration -Secondary to stress response from the patient's volume depletion, vomiting, and pain -As discussed, aggressive fluid resuscitation and conservative measures usually results in an improvement -continue IVF-->improved -Echocardiogram 05/24/2014 showed EF [id number], grade 1 diastolic dysfunction -[id number]  Elevated blood pressure -Likely situational without diagnosis of hypertension -Overall, the preponderance of her blood pressures eventually normalizes -Do not plan to start anti-HTN regimen   Disposition Plan:   Home in 2-3 days  Family Communication:  No  Family at beside  Consultants:  [facility]  Code Status:  FULL    Subjective: Patient complains persistent vomiting. Denies any fevers, chills, chest pain, headache, neck pain, diarrhea. Denies any rashes, synovitis, dysuria, hematuria.  Objective: Filed Vitals:   05/28/15 2025 05/29/15 0648 05/29/15 0820 05/29/15 1515  BP: 115/82 140/95  143/104  Pulse: 97 106  118  Temp: 99.1 F (37.3 C) 98.2 F (36.8 C)  99.1 F (37.3 C)  TempSrc: Oral Oral    Resp: 19 19  18   Height:      Weight:      SpO2: 100% 98% 100% 100%    Intake/Output Summary (Last 24 hours) at 05/29/15 1747 Last data filed at 05/29/15 0513  Gross per 24 hour  Intake    988 ml  Output      0 ml  Net    988 ml   Weight change:    Exam:   General:  Pt  is alert, follows commands appropriately, not in acute distress  HEENT: No icterus, No thrush, No neck mass, [place]/AT  Cardiovascular: RRR, S1/S2, no rubs, no gallops  Respiratory: CTA bilaterally, no wheezing, no crackles, no rhonchi  Abdomen: Soft/+BS, mild epigastric tender without rebound non distended, no guarding  Extremities: No edema, No lymphangitis, No petechiae, No rashes, no synovitis   Data Reviewed: I have personally reviewed following labs and imaging studies Basic Metabolic Panel:  Recent Labs Lab 05/25/15 1923 05/26/15 1315 05/27/15 1231 05/28/15 1034 05/28/15 1657 05/29/15 0529  NA 133* 138 134* 139  --  139  K 3.5 3.4* 2.8* 3.5  --  3.7  CL 102 105 99* 103  --  103  CO2 19* 21* 21* 22  --  23  GLUCOSE 150* 125* 199* 396*  --  108*  BUN 7 <5* <5* 6  --  <5*  CREATININE 0.38* 0.49 0.64 0.50  --  0.45  CALCIUM 8.3* 8.2* 8.8* 8.7*  --  8.5*  MG  --   --   --   --  1.5*  --   PHOS  --   --   --   --  2.1*  --    Liver Function Tests:  Recent Labs Lab 05/24/15 1653 05/25/15 1923 05/26/15 1315 05/27/15 1231 05/28/15 1034  AST 35 18 17 21 19   ALT 19 11* 13* 13* 13*  ALKPHOS 121 78 73 83 82  BILITOT 1.0 1.1 1.3* 1.1 1.1  PROT 9.6* 6.6 6.3* 7.2 6.7  ALBUMIN 5.5* 3.6 3.4* 4.0 3.7    Recent Labs Lab 05/24/15 1653 05/25/15 1923 05/26/15 1315 05/27/15 1231 05/28/15 1034  LIPASE 16 13 12 13 16    No results for input(s): AMMONIA in the last 168 hours. Coagulation Profile: No results for input(s): INR, PROTIME in the last 168 hours. CBC:  Recent Labs Lab 05/25/15 1923 05/26/15 1315 05/27/15 1231 05/28/15 1034 05/29/15 0529  WBC 5.2 5.2 4.5 7.7 4.7  NEUTROABS  --  3.5  --   --   --   HGB 12.4 11.4* 12.7 12.4 11.7*  HCT 36.4 33.1* 36.7 36.1 33.8*  MCV 89.2 88.0 88.4 90.0 88.7  PLT 371 320 362 336 351   Cardiac Enzymes: No results for input(s): CKTOTAL, CKMB, CKMBINDEX, TROPONINI in the last 168  hours. BNP: Invalid input(s): POCBNP CBG:  Recent Labs Lab 05/28/15 2318 05/29/15 0458 05/29/15 0737 05/29/15 1153 05/29/15 1640  GLUCAP 119* 81 147* 287* 230*   HbA1C: No results for input(s): HGBA1C in the last 72 hours. Urine analysis:    Component Value Date/Time   COLORURINE YELLOW 05/28/2015 1145   APPEARANCEUR CLEAR 05/28/2015 1145   LABSPEC 1.028 05/28/2015 1145   PHURINE 6.0 05/28/2015 1145   GLUCOSEU >1000* 05/28/2015 1145   GLUCOSEU >=1000 11/07/2012 1205   HGBUR NEGATIVE 05/28/2015 1145   BILIRUBINUR NEGATIVE 05/28/2015 1145   KETONESUR >80* 05/28/2015 1145   PROTEINUR NEGATIVE 05/28/2015 1145   UROBILINOGEN 0.2 11/24/2014 1045   NITRITE NEGATIVE 05/28/2015 1145   LEUKOCYTESUR NEGATIVE 05/28/2015 1145   Sepsis Labs: @LABRCNTIP (procalcitonin:4,lacticidven:4) )No results found for this or any previous visit (from the past 240 hour(s)).   Scheduled Meds: . budesonide (PULMICORT) nebulizer solution  0.25 mg Nebulization BID  . chlorhexidine  15 mL Mouth/Throat BID  . insulin aspart  0-9 Units Subcutaneous TID WC  . insulin glargine  15 Units Subcutaneous q1800  . metoCLOPramide (REGLAN) injection  10 mg Intravenous Q6H  . pantoprazole (PROTONIX)  IV  40 mg Intravenous Q12H   Continuous Infusions: . lactated ringers with kcl 125 mL/hr at 05/29/15 1602    Procedures/Studies: No results found.  Kaleel Schmieder, DO  [facility] Pager [phone]  If 7PM-7AM, please contact night-coverage [email or web address] Password [facility] 05/29/2015, 5:47 PM

## 2015-05-29 NOTE — Progress Notes (Signed)
Initial Nutrition Assessment  DOCUMENTATION CODES:   Not applicable  INTERVENTION:  -RD continue to monitor -Diet advancement per MD  NUTRITION DIAGNOSIS:   Inadequate oral intake related to nausea, vomiting, inability to eat as evidenced by per patient/family report.  GOAL:   Patient will meet greater than or equal to 90% of their needs  MONITOR:   Labs, Diet advancement, Weight trends, I & O's, Skin  REASON FOR ASSESSMENT:   Malnutrition Screening Tool    ASSESSMENT:   [name] is a 25 y.o. female with PMH significant for uncontrolled type 1 diabetes mellitus, asthma, hypertension (not currently on any antihypertensive agent), GERD and presumed gastroparesis; who presented to the emergency department secondary to abdominal pain, intractable nausea/vomiting, dehydration and hematemesis  Briefly spoke with [name] at bedside. She wasn't feeling well during my visit, continues to vomit and feel nauseous. States medication is not helping. She endorses 12# wt loss PTA, however per chart, pt exhibits only a 6#/5% insignificant wt loss over 4 months.  PTA - she was experiencing symptoms for 3-4 days, constantly vomiting for ~ 40 hours.   She is also an uncontrolled Type 1 diabetic -> most recent A1C 9.2 No signs of DKA upon admit.  Currently NPO.  Unable to complete Nutrition-Focused physical exam at this time.   Will follow-up when pt is feeling better/diet is advanced. Unable to dx malnutrition at this time.  Diet Order:  Diet NPO time specified Except for: Ice Chips  Skin:  Reviewed, no issues  Last BM:  5/4  Height:   Ht Readings from Last 1 Encounters:  05/28/15 5\' 4"  (1.626 m)    Weight:   Wt Readings from Last 1 Encounters:  05/28/15 113 lb 15.7 oz (51.7 kg)    Ideal Body Weight:  54.54 kg  BMI:  Body mass index is 19.55 kg/(m^2).  Estimated Nutritional Needs:   Kcal:  1250-1550  Protein:  50-60 grams  Fluid:  >/=  1.3L  EDUCATION NEEDS:   No education needs identified at this time  Satira Anis. Stefanie Weisel, MS, RD LDN After Hours/Weekend Pager [phone]

## 2015-05-29 NOTE — Consult Note (Signed)
Referring Provider: Dr. Dyann Kief Primary Care Physician:  Vicenta Aly, FNP Primary Gastroenterologist:  Althia Forts, discharged from [facility] recently  Reason for Consultation:  Gastroparesis; nausea/vomiting; hematemesis  HPI: [name] is a 25 y.o. female with PMH significant for uncontrolled type 1 diabetes mellitus, asthma, hypertension (not currently on any antihypertensive agent), recent cholecystectomy for symptomatic gallstones, GERD, and presumed gastroparesis.  She presented to the emergency department secondary to abdominal pain, intractable nausea/vomiting, dehydration and hematemesis. Patient reports that her symptoms have been worse over the past month or so with constant, daily nausea and vomiting/dry heaves.  Yesterday, upon presenting to the ED, the patient endorsed ongoing symptoms and after multiple vomiting episodes started to notice blood in her emesis.  GI was consulted.  She has not seen any further blood since her admission.  Hgb stable at 11.7 grams, which is about her baseline.  She continues to dry heave and retch during our visit, spitting saliva/mucus into the emesis basin.  Blood sugars fairly controlled.  She is on Reglan 10 mg IV ACHS and zofran prn.  She says that Reglan usually helps.  Zofran does not help much.  Phenergan usually helps but that is apparently on a shortage.  Is on BID PPI.  Past Medical History  Diagnosis Date  . Diabetes mellitus   . Asthma   . Pancreatitis, acute 11/26/2014  . Hepatic steatosis 11/26/2014  . Liver mass 11/26/2014  . Hypertension     NOT CURRENTLY ON ANY BP MED  . Heart murmur   . Arthritis   . Gallstones   . Anxiety     Past Surgical History  Procedure Laterality Date  . Wisdom tooth extraction    . Esophagogastroduodenoscopy (egd) with propofol Left 09/20/2014    Procedure: ESOPHAGOGASTRODUODENOSCOPY (EGD) WITH PROPOFOL;  Surgeon: Arta Silence, MD;  Location: [facility] ENDOSCOPY;  Service: Endoscopy;  Laterality:  Left;  . Cholecystectomy N/A 02/11/2015    Procedure: LAPAROSCOPIC CHOLECYSTECTOMY WITH INTRAOPERATIVE CHOLANGIOGRAM;  Surgeon: Greer Pickerel, MD;  Location: [facility] ORS;  Service: General;  Laterality: N/A;    Prior to Admission medications   Medication Sig Start Date End Date Taking? Authorizing Provider  albuterol (PROVENTIL HFA;VENTOLIN HFA) 108 (90 BASE) MCG/ACT inhaler Inhale 2 puffs into the lungs every 6 (six) hours as needed for shortness of breath.   Yes Historical Provider, MD  albuterol (PROVENTIL) (2.5 MG/3ML) 0.083% nebulizer solution Take 2.5 mg by nebulization every 6 (six) hours as needed for shortness of breath.   Yes Historical Provider, MD  budesonide-formoterol (SYMBICORT) 160-4.5 MCG/ACT inhaler Inhale 2 puffs into the lungs 2 (two) times daily.    Yes Historical Provider, MD  EPINEPHrine (EPIPEN) 0.3 mg/0.3 mL SOAJ injection Inject 0.3 mg into the muscle once. As needed for allergic reaction   Yes Historical Provider, MD  insulin aspart (NOVOLOG FLEXPEN) 100 UNIT/ML FlexPen INJECT 0-20 UNITS INTO THE SKIN AS DIRECTED. SSI - CARB SCALE, 0-20 UNITS Patient taking differently: Inject into the skin 4 (four) times daily as needed for high blood sugar. Sliding scale 10/19/13  Yes Elayne Snare, MD  Insulin Glargine (LANTUS SOLOSTAR) 100 UNIT/ML Solostar Pen Inject 24 Units into the skin daily at 10 pm. 05/25/15  Yes Domenic Polite, MD  metoCLOPramide (REGLAN) 10 MG tablet Take 1 tablet (10 mg total) by mouth 3 (three) times daily before meals. 05/27/15  Yes Shary Decamp, PA-C  montelukast (SINGULAIR) 10 MG tablet Take 10 mg by mouth daily.   Yes Historical Provider, MD  omeprazole (PRILOSEC) 40  MG capsule Take 1 capsule (40 mg total) by mouth daily. 04/23/15  Yes Orson Eva, MD  ondansetron (ZOFRAN) 4 MG tablet Take 1 tablet (4 mg total) by mouth every 6 (six) hours. 04/23/15  Yes Ontario Lions, PA-C  oxyCODONE-acetaminophen (PERCOCET/ROXICET) 5-325 MG tablet Take 1 tablet by mouth every 4 (four)  hours as needed for severe pain. 05/26/15  Yes Virgel Manifold, MD  promethazine (PHENERGAN) 25 MG tablet Take 1 tablet (25 mg total) by mouth every 6 (six) hours as needed for nausea or vomiting. 05/26/15  Yes Samantha Tripp Dowless, PA-C    Current Facility-Administered Medications  Medication Dose Route Frequency Provider Last Rate Last Dose  . acetaminophen (TYLENOL) tablet 650 mg  650 mg Oral Q6H PRN Barton Dubois, MD       Or  . acetaminophen (TYLENOL) suppository 650 mg  650 mg Rectal Q6H PRN Barton Dubois, MD      . albuterol (PROVENTIL) (2.5 MG/3ML) 0.083% nebulizer solution 2.5 mg  2.5 mg Nebulization Q6H PRN Barton Dubois, MD      . budesonide (PULMICORT) nebulizer solution 0.25 mg  0.25 mg Nebulization BID Barton Dubois, MD   0.25 mg at 05/29/15 0820  . chlorhexidine (PERIDEX) 0.12 % solution 15 mL  15 mL Mouth/Throat BID Barton Dubois, MD   15 mL at 05/29/15 1005  . hydrALAZINE (APRESOLINE) injection 10 mg  10 mg Intravenous Q8H PRN Barton Dubois, MD      . insulin aspart (novoLOG) injection 0-9 Units  0-9 Units Subcutaneous TID WC Barton Dubois, MD   1 Units at 05/29/15 0747  . insulin glargine (LANTUS) injection 15 Units  15 Units Subcutaneous q1800 Barton Dubois, MD   15 Units at 05/28/15 1708  . lactated ringers 1,000 mL with potassium chloride 20 mEq infusion   Intravenous Continuous Harvel Quale, MD 125 mL/hr at 05/29/15 0511    . metoCLOPramide (REGLAN) injection 10 mg  10 mg Intravenous Q6H Barton Dubois, MD   10 mg at 05/29/15 1155  . morphine 2 MG/ML injection 2 mg  2 mg Intravenous Q4H PRN Barton Dubois, MD   2 mg at 05/29/15 0924  . ondansetron (ZOFRAN) tablet 4 mg  4 mg Oral Q6H PRN Barton Dubois, MD       Or  . ondansetron [facility]) injection 4 mg  4 mg Intravenous Q6H PRN Barton Dubois, MD   4 mg at 05/29/15 0746  . oxyCODONE-acetaminophen (PERCOCET/ROXICET) 5-325 MG per tablet 1 tablet  1 tablet Oral Q4H PRN Barton Dubois, MD   1 tablet at 05/28/15 1625  . pantoprazole  (PROTONIX) injection 40 mg  40 mg Intravenous Q12H Barton Dubois, MD   40 mg at 05/29/15 [id number]    Allergies as of 05/28/2015 - Review Complete 05/28/2015  Allergen Reaction Noted  . Peanut-containing drug products Swelling 12/04/2011  . Strawberry extract Swelling 12/04/2011    Family History  Problem Relation Age of Onset  . Diabetes Mother   . Hyperlipidemia Mother   . Hypertension Father   . Heart disease Father   . Heart disease Maternal Grandmother   . Heart disease Maternal Grandfather   . Hypertension Paternal Grandmother   . Cancer Paternal Grandfather     Social History   Social History  . Marital Status: Single    Spouse Name: N/A  . Number of Children: 1  . Years of Education: 12   Occupational History  . Unemployed.    Social History Main Topics  . Smoking  status: Never Smoker   . Smokeless tobacco: Never Used  . Alcohol Use: No  . Drug Use: Yes    Special: Marijuana     Comment: X3 PER WEEK  last time used was 1400 02/10/15  . Sexual Activity: Not Currently   Other Topics Concern  . Not on file   Social History Narrative   Lives with her mother.  Unemployed.  Trying to qualify for disability.    Review of Systems: Ten point ROS is O/W negative except as mentioned in HPI.  Physical Exam: Vital signs in last 24 hours: Temp:  [98.2 F (36.8 C)-99.1 F (37.3 C)] 98.2 F (36.8 C) (05/10 0648) Pulse Rate:  [97-108] 106 (05/10 0648) Resp:  [19-20] 19 (05/10 0648) BP: (115-140)/(82-100) 140/95 mmHg (05/10 0648) SpO2:  [98 %-100 %] 100 % (05/10 0820) Weight:  [113 lb 15.7 oz (51.7 kg)] 113 lb 15.7 oz (51.7 kg) (05/09 1445)   General:  Alert, Well-developed, well-nourished, cooperative but moaning, moving all around, dry heaving while I was in the room Head:  Normocephalic and atraumatic. Eyes:  Sclera clear, no icterus.  Conjunctiva pink. Ears:  Normal auditory acuity. Mouth:  No deformity or lesions.   Lungs:  Clear throughout to auscultation.   No wheezes, crackles, or rhonchi.  Heart:  Slightly tachy.  No murmurs. Abdomen:  Soft, non-distended.  BS present.  Minimal TTP. Rectal:  Deferred  Msk:  Symmetrical without gross deformities. Pulses:  Normal pulses noted. Extremities:  Without clubbing or edema. Neurologic:  Alert and oriented x 4;  grossly normal neurologically. Skin:  Intact without significant lesions or rashes.   Intake/Output from previous day: 05/09 0701 - 05/10 0700 In: 1988 [I.V.:988; IV Piggyback:1000] Out: -   Lab Results:  Recent Labs  05/27/15 1231 05/28/15 1034 05/29/15 0529  WBC 4.5 7.7 4.7  HGB 12.7 12.4 11.7*  HCT 36.7 36.1 33.8*  PLT 362 336 351   BMET  Recent Labs  05/27/15 1231 05/28/15 1034 05/29/15 0529  NA 134* 139 139  K 2.8* 3.5 3.7  CL 99* 103 103  CO2 21* 22 23  GLUCOSE 199* 396* 108*  BUN <5* 6 <5*  CREATININE 0.64 0.50 0.45  CALCIUM 8.8* 8.7* 8.5*   LFT  Recent Labs  05/28/15 1034  PROT 6.7  ALBUMIN 3.7  AST 19  ALT 13*  ALKPHOS 82  BILITOT 1.1   IMPRESSION:  1. Intractable nausea and vomiting, has been recurrent but worsened and constant over the past month associated with upper abdominal pains.  Has presumed gastroparesis but I do not see that she's had a GES performed.  2.  Hematemesis:  No further episodes.  Hgb is stable. Suspected that this was related to her recurrent vomiting, ie, MWT, esophagitis, etc. 3. Liver lesions, suspect benign adenomas on MRI, she is not on OCP's. 4. Hypokalemia:  Resolved.   5.  IDDM:  Blood sugars overall controlled recently.  PLAN: -Will discuss with Dr. Hilarie Fredrickson.  Doubt endoscopy will be useful. -Monitor Hgb and other labs. -Continue symptomatic management with Reglan 10 mg IV ACHS, zofran prn, etc for now.  Says that phenergan works, but apparently there is a Event organiser of that. -Minimize narcotic use, which I discussed with her. -Eventual GES, probably as outpatient.  Heidie Krall D.  05/29/2015, 12:41  PM  Pager number [phone]

## 2015-05-30 DIAGNOSIS — E1043 Type 1 diabetes mellitus with diabetic autonomic (poly)neuropathy: Secondary | ICD-10-CM | POA: Diagnosis present

## 2015-05-30 DIAGNOSIS — R1110 Vomiting, unspecified: Secondary | ICD-10-CM | POA: Diagnosis not present

## 2015-05-30 DIAGNOSIS — J45909 Unspecified asthma, uncomplicated: Secondary | ICD-10-CM | POA: Diagnosis present

## 2015-05-30 DIAGNOSIS — R112 Nausea with vomiting, unspecified: Secondary | ICD-10-CM | POA: Diagnosis present

## 2015-05-30 DIAGNOSIS — E1065 Type 1 diabetes mellitus with hyperglycemia: Secondary | ICD-10-CM | POA: Diagnosis not present

## 2015-05-30 DIAGNOSIS — R17 Unspecified jaundice: Secondary | ICD-10-CM | POA: Insufficient documentation

## 2015-05-30 DIAGNOSIS — Z79899 Other long term (current) drug therapy: Secondary | ICD-10-CM | POA: Diagnosis not present

## 2015-05-30 DIAGNOSIS — K760 Fatty (change of) liver, not elsewhere classified: Secondary | ICD-10-CM | POA: Diagnosis present

## 2015-05-30 DIAGNOSIS — Z7951 Long term (current) use of inhaled steroids: Secondary | ICD-10-CM | POA: Diagnosis not present

## 2015-05-30 DIAGNOSIS — R000 Tachycardia, unspecified: Secondary | ICD-10-CM | POA: Diagnosis present

## 2015-05-30 DIAGNOSIS — K3184 Gastroparesis: Secondary | ICD-10-CM | POA: Diagnosis present

## 2015-05-30 DIAGNOSIS — F419 Anxiety disorder, unspecified: Secondary | ICD-10-CM | POA: Diagnosis present

## 2015-05-30 DIAGNOSIS — E860 Dehydration: Secondary | ICD-10-CM | POA: Diagnosis present

## 2015-05-30 DIAGNOSIS — Z91010 Allergy to peanuts: Secondary | ICD-10-CM | POA: Diagnosis not present

## 2015-05-30 DIAGNOSIS — K920 Hematemesis: Secondary | ICD-10-CM | POA: Diagnosis present

## 2015-05-30 DIAGNOSIS — Z794 Long term (current) use of insulin: Secondary | ICD-10-CM | POA: Diagnosis not present

## 2015-05-30 DIAGNOSIS — E1010 Type 1 diabetes mellitus with ketoacidosis without coma: Principal | ICD-10-CM

## 2015-05-30 DIAGNOSIS — Z91018 Allergy to other foods: Secondary | ICD-10-CM | POA: Diagnosis not present

## 2015-05-30 DIAGNOSIS — K2970 Gastritis, unspecified, without bleeding: Secondary | ICD-10-CM | POA: Diagnosis present

## 2015-05-30 DIAGNOSIS — R1084 Generalized abdominal pain: Secondary | ICD-10-CM | POA: Diagnosis not present

## 2015-05-30 DIAGNOSIS — R030 Elevated blood-pressure reading, without diagnosis of hypertension: Secondary | ICD-10-CM | POA: Diagnosis present

## 2015-05-30 LAB — BASIC METABOLIC PANEL
Anion gap: 21 — ABNORMAL HIGH (ref 5–15)
Anion gap: 14 (ref 5–15)
Anion gap: 14 (ref 5–15)
BUN: 6 mg/dL (ref 6–20)
BUN: 7 mg/dL (ref 6–20)
BUN: 6 mg/dL (ref 6–20)
BUN: 6 mg/dL (ref 6–20)
CALCIUM: 9.2 mg/dL (ref 8.9–10.3)
CALCIUM: 9.1 mg/dL (ref 8.9–10.3)
CHLORIDE: 105 mmol/L (ref 101–111)
CHLORIDE: 109 mmol/L (ref 101–111)
CO2: 7 mmol/L — ABNORMAL LOW (ref 22–32)
CO2: 12 mmol/L — ABNORMAL LOW (ref 22–32)
CO2: 13 mmol/L — ABNORMAL LOW (ref 22–32)
CREATININE: 0.95 mg/dL (ref 0.44–1.00)
CREATININE: 0.81 mg/dL (ref 0.44–1.00)
CREATININE: 0.7 mg/dL (ref 0.44–1.00)
CREATININE: 0.68 mg/dL (ref 0.44–1.00)
Calcium: 9.1 mg/dL (ref 8.9–10.3)
Calcium: 9.3 mg/dL (ref 8.9–10.3)
Chloride: 108 mmol/L (ref 101–111)
Chloride: 109 mmol/L (ref 101–111)
GFR calc Af Amer: >60 mL/min (ref >60)
GFR calc Af Amer: >60 mL/min (ref >60)
GFR calc Af Amer: >60 mL/min (ref >60)
GFR calc non Af Amer: >60 mL/min (ref >60)
GLUCOSE: 370 mg/dL — AB (ref 65–99)
GLUCOSE: 182 mg/dL — AB (ref 65–99)
GLUCOSE: 159 mg/dL — AB (ref 65–99)
GLUCOSE: 153 mg/dL — AB (ref 65–99)
POTASSIUM: 3.6 mmol/L (ref 3.5–5.1)
Potassium: 5.1 mmol/L (ref 3.5–5.1)
Potassium: 4.3 mmol/L (ref 3.5–5.1)
Potassium: 3.8 mmol/L (ref 3.5–5.1)
SODIUM: 136 mmol/L (ref 135–145)
SODIUM: 135 mmol/L (ref 135–145)
SODIUM: 136 mmol/L (ref 135–145)
Sodium: 135 mmol/L (ref 135–145)

## 2015-05-30 LAB — GLUCOSE, CAPILLARY
GLUCOSE-CAPILLARY: 102 mg/dL — AB (ref 65–99)
GLUCOSE-CAPILLARY: 137 mg/dL — AB (ref 65–99)
GLUCOSE-CAPILLARY: 153 mg/dL — AB (ref 65–99)
GLUCOSE-CAPILLARY: 181 mg/dL — AB (ref 65–99)
GLUCOSE-CAPILLARY: 297 mg/dL — AB (ref 65–99)
Glucose-Capillary: 339 mg/dL — ABNORMAL HIGH (ref 65–99)
Glucose-Capillary: 233 mg/dL — ABNORMAL HIGH (ref 65–99)
Glucose-Capillary: 169 mg/dL — ABNORMAL HIGH (ref 65–99)
Glucose-Capillary: 217 mg/dL — ABNORMAL HIGH (ref 65–99)
Glucose-Capillary: 146 mg/dL — ABNORMAL HIGH (ref 65–99)
Glucose-Capillary: 173 mg/dL — ABNORMAL HIGH (ref 65–99)
Glucose-Capillary: 136 mg/dL — ABNORMAL HIGH (ref 65–99)

## 2015-05-30 LAB — MAGNESIUM: Magnesium: 1.7 mg/dL (ref 1.7–2.4)

## 2015-05-30 MED ORDER — DEXTROSE 50 % IV SOLN
25.0000 mL | INTRAVENOUS | Status: DC | PRN
  Administered 2015-06-01: 25 mL via INTRAVENOUS
  Filled 2015-05-30: qty 50

## 2015-05-30 MED ORDER — SODIUM CHLORIDE 0.9 % IV BOLUS (SEPSIS)
2000.0000 mL | Freq: Once | INTRAVENOUS | Status: AC
Start: 2015-05-30 — End: 2015-05-31
  Administered 2015-05-30: 2000 mL via INTRAVENOUS

## 2015-05-30 MED ORDER — INSULIN REGULAR BOLUS VIA INFUSION
0.0000 [IU] | Freq: Three times a day (TID) | INTRAVENOUS | Status: DC
  Filled 2015-05-30: qty 10

## 2015-05-30 MED ORDER — PROCHLORPERAZINE EDISYLATE 5 MG/ML IJ SOLN
5.0000 mg | INTRAMUSCULAR | Status: AC
  Administered 2015-05-30: 5 mg via INTRAVENOUS
  Filled 2015-05-30: qty 2

## 2015-05-30 MED ORDER — PROCHLORPERAZINE EDISYLATE 5 MG/ML IJ SOLN
10.0000 mg | Freq: Once | INTRAMUSCULAR | Status: AC
  Administered 2015-05-30: 10 mg via INTRAVENOUS
  Filled 2015-05-30: qty 2

## 2015-05-30 MED ORDER — INSULIN ASPART 100 UNIT/ML ~~LOC~~ SOLN
0.0000 [IU] | SUBCUTANEOUS | Status: DC
  Administered 2015-05-30: 7 [IU] via SUBCUTANEOUS
  Administered 2015-05-30: 3 [IU] via SUBCUTANEOUS

## 2015-05-30 MED ORDER — PROCHLORPERAZINE EDISYLATE 5 MG/ML IJ SOLN
5.0000 mg | Freq: Four times a day (QID) | INTRAMUSCULAR | Status: DC | PRN
  Administered 2015-05-30: 5 mg via INTRAVENOUS
  Filled 2015-05-30: qty 2

## 2015-05-30 MED ORDER — DEXTROSE-NACL 5-0.45 % IV SOLN
INTRAVENOUS | Status: DC
  Administered 2015-05-30 – 2015-05-31 (×2): via INTRAVENOUS
  Administered 2015-05-31: 1000 mL via INTRAVENOUS

## 2015-05-30 MED ORDER — SODIUM CHLORIDE 0.9 % IV SOLN: INTRAVENOUS | Status: DC

## 2015-05-30 MED ORDER — INSULIN REGULAR HUMAN 100 UNIT/ML IJ SOLN
INTRAMUSCULAR | Status: DC
  Administered 2015-05-30: 0.4 [IU]/h via INTRAVENOUS
  Filled 2015-05-30: qty 2.5

## 2015-05-30 NOTE — Progress Notes (Signed)
PROGRESS NOTE  [name] [id number] DOB: 04-26-1990 DOA: 05/28/2015 PCP: Vicenta Aly, FNP  Brief History:  25 year old female with a history of diabetes mellitus type 1, recent pancreatitis, liver adenomas, anxiety, hepatic steatosis, asthma presented with 2-3 day history of nausea, vomiting. The patient has had numerous admissions in the past 6 months secondary to DKA as well as for N/V. Most recently, the patient was discharged after an admission from 03/21/2015 through 03/25/2015 and an admission from 04/08/2015 through 04/15/2015 for DKA. She was also recently discharged on 05/26/2015 for treatment of intractable nausea and vomiting. During her admissions, it has been characteristic for slow recovery due to slow recovering metabolic acidosis which is initially gapped and subsequently nongapped. In addition, her admissions have been characterized by profound volume depletion requiring significant fluid resuscitation which subsequently resulted in improvement in the patient's sinus tachycardia and metabolic acidosis. Finally, her previous admissions have been characterized by opioid dependence for her "abdominal pain"which has resulted in exacerbation of her presumed gastroparesis resulting in slow recovery of her nausea and vomiting.  Assessment/Plan: DKA, type 1 -05/30/2015 labs suggest DKA-CBG 370 and elevated anion gap -start IV insulin -start IVF -BMP q 4 hours  Nausea and vomiting/Abdominal pain -likely gastroparesis related to her DM  -04/21/2015 CT abdomen and pelvis--stable hepatic masses, normal pancreas, normal bowel wall thickening or inflammation, normal spleen and pancreas -09/23/2014 EGD--diffuse gastritis without clear explanation of the patient's hematemesis -appreciate GI input-->symptomatic treatment; low yield for endoscopy -Continue IV fluid, metoclopramide -Minimize opioids -continue PPI  Hematemesis -Hemoglobin appears to be stable, likely  secondary to forceful vomiting, 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 -Patient had same complaint on numerous previous admissions -09/23/2014 EGD--diffuse gastritis without clear explanation of the patient's hematemesis -Although the patient has dilution drop in her hemoglobin, her hemoglobin overall remained stable when compared to her multiple admissions -Continue to trend hemoglobin--stable -Baseline hemoglobin 11-12  DM type I, uncontrolled -continue sensitive SSI-->now back on insulin drip -Continue fluid resuscitation -04/13/2015 hemoglobin A1c 9.3  -03/22/2015 UDS positive for opiates  Asthma -No wheezing or stridor -Stable on room air -Continue LABA -continue singulair  Sinus tachycardia/Dehydration -Secondary to stress response from the patient's volume depletion, vomiting, and pain -As discussed, aggressive fluid resuscitation and conservative measures usually results in an improvement -continue IVF-->improved -Echocardiogram 05/24/2014 showed EF [id number], grade 1 diastolic dysfunction -[id number]  Elevated blood pressure -Likely situational without diagnosis of hypertension -Overall, the preponderance of her blood pressures eventually normalizes -Do not plan to start anti-HTN regimen   Disposition Plan: Transfer to stepdown Family Communication: Mother updated on phone 05/30/15  Consultants: [facility]  Code Status: FULL   Subjective: The patient continues to complain of vomiting without any blood now. Denies any chest pain, shortness breath, diarrhea, dysuria, hematuria. Denies any fevers, chills, headache, neck pain.  Objective: Filed Vitals:   05/29/15 0820 05/29/15 1515 05/29/15 2037 05/30/15 0510  BP:  143/104 125/97 120/62  Pulse:  118 137 134  Temp:  99.1 F (37.3 C) 98.8 F (37.1 C) 98.6 F (37 C)  TempSrc:   Oral Oral  Resp:  18 17 17   Height:      Weight:      SpO2: 100% 100% 100% 100%    Intake/Output Summary (Last 24  hours) at 05/30/15 0946 Last data filed at 05/30/15 0600  Gross per 24 hour  Intake 3097.92 ml  Output      0 ml  Net 3097.92 ml  Weight change:  Exam:   General:  Pt is alert, follows commands appropriately, not in acute distress  HEENT: No icterus, No thrush, No neck mass, [place]/AT  Cardiovascular: RRR, S1/S2, no rubs, no gallops  Respiratory: CTA bilaterally, no wheezing, no crackles, no rhonchi  Abdomen: Soft/+BS, non tender, non distended, no guarding  Extremities: No edema, No lymphangitis, No petechiae, No rashes, no synovitis   Data Reviewed: I have personally reviewed following labs and imaging studies Basic Metabolic Panel:  Recent Labs Lab 05/26/15 1315 05/27/15 1231 05/28/15 1034 05/28/15 1657 05/29/15 0529 05/30/15 0527  NA 138 134* 139  --  139 135  K 3.4* 2.8* 3.5  --  3.7 5.1  CL 105 99* 103  --  103 105  CO2 21* 21* 22  --  23 <7*  GLUCOSE 125* 199* 396*  --  108* 370*  BUN <5* <5* 6  --  <5* 6  CREATININE 0.49 0.64 0.50  --  0.45 0.95  CALCIUM 8.2* 8.8* 8.7*  --  8.5* 9.2  MG  --   --   --  1.5*  --  1.7  PHOS  --   --   --  2.1*  --   --    Liver Function Tests:  Recent Labs Lab 05/24/15 1653 05/25/15 1923 05/26/15 1315 05/27/15 1231 05/28/15 1034  AST 35 18 17 21 19   ALT 19 11* 13* 13* 13*  ALKPHOS 121 78 73 83 82  BILITOT 1.0 1.1 1.3* 1.1 1.1  PROT 9.6* 6.6 6.3* 7.2 6.7  ALBUMIN 5.5* 3.6 3.4* 4.0 3.7    Recent Labs Lab 05/24/15 1653 05/25/15 1923 05/26/15 1315 05/27/15 1231 05/28/15 1034  LIPASE 16 13 12 13 16    No results for input(s): AMMONIA in the last 168 hours. Coagulation Profile: No results for input(s): INR, PROTIME in the last 168 hours. CBC:  Recent Labs Lab 05/25/15 1923 05/26/15 1315 05/27/15 1231 05/28/15 1034 05/29/15 0529  WBC 5.2 5.2 4.5 7.7 4.7  NEUTROABS  --  3.5  --   --   --   HGB 12.4 11.4* 12.7 12.4 11.7*  HCT 36.4 33.1* 36.7 36.1 33.8*  MCV 89.2 88.0 88.4 90.0 88.7  PLT 371 320 362 336  351   Cardiac Enzymes: No results for input(s): CKTOTAL, CKMB, CKMBINDEX, TROPONINI in the last 168 hours. BNP: Invalid input(s): POCBNP CBG:  Recent Labs Lab 05/29/15 1640 05/29/15 2040 05/30/15 0023 05/30/15 0459 05/30/15 0747  GLUCAP 230* 209* 297* 339* 233*   HbA1C: No results for input(s): HGBA1C in the last 72 hours. Urine analysis:    Component Value Date/Time   COLORURINE YELLOW 05/28/2015 1145   APPEARANCEUR CLEAR 05/28/2015 1145   LABSPEC 1.028 05/28/2015 1145   PHURINE 6.0 05/28/2015 1145   GLUCOSEU >1000* 05/28/2015 1145   GLUCOSEU >=1000 11/07/2012 1205   HGBUR NEGATIVE 05/28/2015 1145   BILIRUBINUR NEGATIVE 05/28/2015 1145   KETONESUR >80* 05/28/2015 1145   PROTEINUR NEGATIVE 05/28/2015 1145   UROBILINOGEN 0.2 11/24/2014 1045   NITRITE NEGATIVE 05/28/2015 1145   LEUKOCYTESUR NEGATIVE 05/28/2015 1145   Sepsis Labs: @LABRCNTIP (procalcitonin:4,lacticidven:4) )No results found for this or any previous visit (from the past 240 hour(s)).   Scheduled Meds: . chlorhexidine  15 mL Mouth/Throat BID  . insulin regular  0-10 Units Intravenous TID WC  . metoCLOPramide (REGLAN) injection  10 mg Intravenous Q6H  . mometasone-formoterol  2 puff Inhalation BID  . pantoprazole (PROTONIX) IV  40 mg Intravenous Q12H  Continuous Infusions: . sodium chloride    . dextrose 5 % and 0.45% NaCl    . insulin (NOVOLIN-R) infusion      Procedures/Studies: No results found.  Britne Borelli, DO  [facility] Pager [phone]  If 7PM-7AM, please contact night-coverage [email or web address] Password [facility] 05/30/2015, 9:46 AM

## 2015-05-30 NOTE — Progress Notes (Signed)
     [facility] Progress Note  Subjective:  Says that her last episode of vomiting was early this AM.  Last dose of pain medication was around 430 am and says that she is not having pain currently (took 5 doses of morphine yesterday).  Objective:  Vital signs in last 24 hours: Temp:  [98.6 F (37 C)-99.1 F (37.3 C)] 98.6 F (37 C) (05/11 0510) Pulse Rate:  [118-137] 134 (05/11 0510) Resp:  [17-18] 17 (05/11 0510) BP: (120-143)/(62-104) 120/62 mmHg (05/11 0510) SpO2:  [100 %] 100 % (05/11 0510)   General:  Alert, Well-developed, in NAD; resting comfortably in bed. Heart:  Tachy.  No murmurs noted. Pulm:  CTAB.  No W/R/R. Abdomen:  Soft, non-distended.  BS present.  Non-tender.  Extremities:  Without edema. Neurologic:  Alert and oriented x 4;  grossly normal neurologically. Psych:  Alert and cooperative. Normal mood and affect.  Intake/Output from previous day: 05/10 0701 - 05/11 0700 In: 3097.9 [I.V.:3097.9] Out: -   Lab Results:  Recent Labs  05/27/15 1231 05/28/15 1034 05/29/15 0529  WBC 4.5 7.7 4.7  HGB 12.7 12.4 11.7*  HCT 36.7 36.1 33.8*  PLT 362 336 351   BMET  Recent Labs  05/28/15 1034 05/29/15 0529 05/30/15 0527  NA 139 139 135  K 3.5 3.7 5.1  CL 103 103 105  CO2 22 23 <7*  GLUCOSE 396* 108* 370*  BUN 6 <5* 6  CREATININE 0.50 0.45 0.95  CALCIUM 8.7* 8.5* 9.2   LFT  Recent Labs  05/28/15 1034  PROT 6.7  ALBUMIN 3.7  AST 19  ALT 13*  ALKPHOS 82  BILITOT 1.1   Assessment / Plan: 1. Intractable nausea and vomiting, has been recurrent but worsened and constant over the past month associated with upper abdominal pains. Has presumed gastroparesis but I do not see that she's had a GES performed. Blood sugar was quite high this AM. 2. Hematemesis: No further episodes. Hgb is stable. Suspected that this was related to her recurrent vomiting, ie, MWT, esophagitis, etc. 3. Liver lesions, suspect benign adenomas on MRI, she  is not on OCP's. 4. Hypokalemia: Resolved.  5. IDDM: Brittle.   -Will need outpatient GES. -? Trial of clear liquids. -Continue current measures/supportive care.    Barbara Ahart D.  05/30/2015, 9:36 AM  Pager number [phone]

## 2015-05-31 ENCOUNTER — Ambulatory Visit: Payer: BLUE CROSS/BLUE SHIELD | Admitting: Gastroenterology

## 2015-05-31 DIAGNOSIS — R1084 Generalized abdominal pain: Secondary | ICD-10-CM

## 2015-05-31 LAB — BASIC METABOLIC PANEL
Anion gap: 8 (ref 5–15)
Anion gap: 11 (ref 5–15)
Anion gap: 8 (ref 5–15)
BUN: 5 mg/dL — AB (ref 6–20)
BUN: <5 mg/dL — ABNORMAL LOW (ref 6–20)
CALCIUM: 7.7 mg/dL — AB (ref 8.9–10.3)
CALCIUM: 8.6 mg/dL — AB (ref 8.9–10.3)
CHLORIDE: 116 mmol/L — AB (ref 101–111)
CO2: 15 mmol/L — AB (ref 22–32)
CO2: 16 mmol/L — ABNORMAL LOW (ref 22–32)
CO2: 21 mmol/L — AB (ref 22–32)
CREATININE: 0.56 mg/dL (ref 0.44–1.00)
CREATININE: 0.52 mg/dL (ref 0.44–1.00)
Calcium: 8 mg/dL — ABNORMAL LOW (ref 8.9–10.3)
Chloride: 111 mmol/L (ref 101–111)
Chloride: 109 mmol/L (ref 101–111)
Creatinine, Ser: 0.5 mg/dL (ref 0.44–1.00)
GFR calc Af Amer: >60 mL/min (ref >60)
GFR calc non Af Amer: >60 mL/min (ref >60)
GFR calc non Af Amer: >60 mL/min (ref >60)
GFR calc non Af Amer: >60 mL/min (ref >60)
GLUCOSE: 137 mg/dL — AB (ref 65–99)
Glucose, Bld: 147 mg/dL — ABNORMAL HIGH (ref 65–99)
Glucose, Bld: 184 mg/dL — ABNORMAL HIGH (ref 65–99)
POTASSIUM: 3.2 mmol/L — AB (ref 3.5–5.1)
Potassium: 3.3 mmol/L — ABNORMAL LOW (ref 3.5–5.1)
Potassium: 3.4 mmol/L — ABNORMAL LOW (ref 3.5–5.1)
SODIUM: 138 mmol/L (ref 135–145)
Sodium: 139 mmol/L (ref 135–145)
Sodium: 138 mmol/L (ref 135–145)

## 2015-05-31 LAB — GLUCOSE, CAPILLARY
GLUCOSE-CAPILLARY: 194 mg/dL — AB (ref 65–99)
GLUCOSE-CAPILLARY: 131 mg/dL — AB (ref 65–99)
GLUCOSE-CAPILLARY: 136 mg/dL — AB (ref 65–99)
GLUCOSE-CAPILLARY: 161 mg/dL — AB (ref 65–99)
GLUCOSE-CAPILLARY: 146 mg/dL — AB (ref 65–99)
GLUCOSE-CAPILLARY: 127 mg/dL — AB (ref 65–99)
GLUCOSE-CAPILLARY: 116 mg/dL — AB (ref 65–99)
GLUCOSE-CAPILLARY: 166 mg/dL — AB (ref 65–99)
Glucose-Capillary: 135 mg/dL — ABNORMAL HIGH (ref 65–99)
Glucose-Capillary: 136 mg/dL — ABNORMAL HIGH (ref 65–99)
Glucose-Capillary: 145 mg/dL — ABNORMAL HIGH (ref 65–99)
Glucose-Capillary: 149 mg/dL — ABNORMAL HIGH (ref 65–99)
Glucose-Capillary: 195 mg/dL — ABNORMAL HIGH (ref 65–99)
Glucose-Capillary: 227 mg/dL — ABNORMAL HIGH (ref 65–99)
Glucose-Capillary: 162 mg/dL — ABNORMAL HIGH (ref 65–99)
Glucose-Capillary: 187 mg/dL — ABNORMAL HIGH (ref 65–99)
Glucose-Capillary: 116 mg/dL — ABNORMAL HIGH (ref 65–99)
Glucose-Capillary: 142 mg/dL — ABNORMAL HIGH (ref 65–99)
Glucose-Capillary: 157 mg/dL — ABNORMAL HIGH (ref 65–99)

## 2015-05-31 MED ORDER — POTASSIUM CHLORIDE 10 MEQ/100ML IV SOLN
10.0000 meq | INTRAVENOUS | Status: AC
  Administered 2015-05-31 (×3): 10 meq via INTRAVENOUS
  Filled 2015-05-31 (×3): qty 100

## 2015-05-31 MED ORDER — POTASSIUM CHLORIDE IN NACL 20-0.9 MEQ/L-% IV SOLN
INTRAVENOUS | Status: DC
  Administered 2015-05-31 – 2015-06-01 (×2): via INTRAVENOUS
  Administered 2015-06-01: 1 mL via INTRAVENOUS
  Administered 2015-06-01: 01:00:00 via INTRAVENOUS
  Filled 2015-05-31 (×5): qty 1000

## 2015-05-31 MED ORDER — SODIUM CHLORIDE 0.9 % IV SOLN: INTRAVENOUS | Status: DC

## 2015-05-31 MED ORDER — SODIUM CHLORIDE 0.9 % IV BOLUS (SEPSIS)
2000.0000 mL | Freq: Once | INTRAVENOUS | Status: AC
  Administered 2015-05-31: 2000 mL via INTRAVENOUS

## 2015-05-31 MED ORDER — INSULIN ASPART 100 UNIT/ML ~~LOC~~ SOLN
0.0000 [IU] | Freq: Three times a day (TID) | SUBCUTANEOUS | Status: DC
  Administered 2015-05-31: 2 [IU] via SUBCUTANEOUS
  Administered 2015-06-01 (×2): 3 [IU] via SUBCUTANEOUS

## 2015-05-31 MED ORDER — INSULIN ASPART 100 UNIT/ML ~~LOC~~ SOLN: 0.0000 [IU] | Freq: Every day | SUBCUTANEOUS | Status: DC

## 2015-05-31 MED ORDER — INSULIN GLARGINE 100 UNIT/ML ~~LOC~~ SOLN
20.0000 [IU] | Freq: Every day | SUBCUTANEOUS | Status: DC
  Administered 2015-05-31 – 2015-06-01 (×2): 20 [IU] via SUBCUTANEOUS
  Filled 2015-05-31 (×2): qty 0.2

## 2015-05-31 NOTE — Progress Notes (Signed)
Pt admitted to Room 1421. Agree with ICU RN's Shift Assessment.  Lind Guest, RN

## 2015-05-31 NOTE — Progress Notes (Signed)
Inpatient Diabetes Program Recommendations  AACE/ADA: New Consensus Statement on Inpatient Glycemic Control (2015)  Target Ranges:  Prepandial:   less than 140 mg/dL      Peak postprandial:   less than 180 mg/dL (1-2 hours)      Critically ill patients:  140 - 180 mg/dL   Review of Glycemic Control  Results for KAYLENE, DAWN (MRN [id number]) as of 05/31/2015 09:48  Ref. Range 05/31/2015 04:45 05/31/2015 05:50 05/31/2015 06:38 05/31/2015 07:52 05/31/2015 09:01  Glucose-Capillary Latest Ref Range: 65-99 mg/dL 161 (H) 195 (H) 227 (H) 187 (H) 162 (H)  Results for MACALL, MCCROSKEY (MRN [id number]) as of 05/31/2015 09:48  Ref. Range 05/31/2015 01:02 05/31/2015 05:09  Sodium Latest Ref Range: 135-145 mmol/L 139 138  Potassium Latest Ref Range: 3.5-5.1 mmol/L 3.3 (L) 3.4 (L)  Chloride Latest Ref Range: 101-111 mmol/L 116 (H) 111  CO2 Latest Ref Range: 22-32 mmol/L 15 (L) 16 (L)  BUN Latest Ref Range: 6-20 mg/dL 5 (L) <5 (L)  Creatinine Latest Ref Range: 0.44-1.00 mg/dL 0.56 0.52  Calcium Latest Ref Range: 8.9-10.3 mg/dL 8.0 (L) 7.7 (L)  EGFR (Non-African Amer.) Latest Ref Range: >60 mL/min >60 >60  EGFR (African American) Latest Ref Range: >60 mL/min >60 >60  Glucose Latest Ref Range: 65-99 mg/dL 147 (H) 184 (H)  Anion gap Latest Ref Range: 5-_0 AG closed. CO2 still low.  Inpatient Diabetes Program Recommendations:    Continue with GlucoStabilizer until criteria met for transition. Give Lantus 20 units 1-2 hours prior to discontinuation of drip. Advance diet to CHO mod med - RN to bolus for CHOs on [employer]  Will speak with pt today. Thank you. Lorenda Peck, RD, LDN, CDE Inpatient Diabetes Coordinator [phone]

## 2015-05-31 NOTE — Progress Notes (Signed)
Called NP because glucose stabilizer set insulin rate to zero; NP responded to not stop the drip and to renew it at the lowest rate.  Drip restarted at 0.5 units/hr; Will continue to monitor

## 2015-05-31 NOTE — Progress Notes (Signed)
PROGRESS NOTE  [name] [id number] DOB: August 15, 1990 DOA: 05/28/2015 PCP: Vicenta Aly, FNP Brief History:  25 year old female with a history of diabetes mellitus type 1, recent pancreatitis, liver adenomas, anxiety, hepatic steatosis, asthma presented with 2-3 day history of nausea, vomiting. The patient has had numerous admissions in the past 6 months secondary to DKA as well as for N/V. Most recently, the patient was discharged after an admission from 03/21/2015 through 03/25/2015 and an admission from 04/08/2015 through 04/15/2015 for DKA. She was also recently discharged on 05/26/2015 for treatment of intractable nausea and vomiting. During her admissions, it has been characteristic for slow recovery due to slow recovering metabolic acidosis which is initially gapped and subsequently nongapped. In addition, her admissions have been characterized by profound volume depletion requiring significant fluid resuscitation which subsequently resulted in improvement in the patient's sinus tachycardia and metabolic acidosis. Finally, her previous admissions have been characterized by opioid dependence for her "abdominal pain"which has resulted in exacerbation of her presumed gastroparesis resulting in slow recovery of her nausea and vomiting.  Assessment/Plan: DKA, type 1 -05/30/2015 labs suggest DKA-CBG 370 and elevated anion gap -start IV insulin --Patient was started on intravenous fluids and intravenous insulin protocol -BMP every 4 hours was ordered -Anion gap subsequently closed, and the patient was transitioned to subcutaneous insulin -Transferred to regular medical floor  Nausea and vomiting/Abdominal pain -likely gastroparesis related to her DM  -04/21/2015 CT abdomen and pelvis--stable hepatic masses, normal pancreas, normal bowel wall thickening or inflammation, normal spleen and pancreas -09/23/2014 EGD--diffuse gastritis without clear explanation of the patient's  hematemesis -appreciate GI input-->symptomatic treatment; low yield for endoscopy -Continue IV fluid, metoclopramide -Minimize opioids -continue PPI  Hematemesis -Hemoglobin appears to be stable, likely secondary to forceful vomiting, 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 -Patient had same complaint on numerous previous admissions -09/23/2014 EGD--diffuse gastritis without clear explanation of the patient's hematemesis -Although the patient has dilution drop in her hemoglobin, her hemoglobin overall remained stable when compared to her multiple admissions -Continue to trend hemoglobin--stable -Baseline hemoglobin 11-12  DM type I, uncontrolled -continue sensitive SSI-->now back on insulin drip -Continue fluid resuscitation -04/13/2015 hemoglobin A1c 9.3  -03/22/2015 UDS positive for opiates  Asthma -No wheezing or stridor -Stable on room air -Continue LABA -continue singulair  Sinus tachycardia/Dehydration -Secondary to stress response from the patient's volume depletion, vomiting, and pain -As discussed, aggressive fluid resuscitation and conservative measures usually results in an improvement -continue IVF-->improved -Echocardiogram 05/24/2014 showed EF Q000111Q, grade 1 diastolic dysfunction -[id number]  Elevated blood pressure -Likely situational without diagnosis of hypertension -Overall, the preponderance of her blood pressures eventually normalizes -Do not plan to start anti-HTN regimen   Disposition Plan: Transfer to tele Family Communication: Mother updated on phone 05/30/15  Consultants: [facility]  Code Status: FULL   Subjective: Patient did have an emesis this morning or afternoon. Denies any fevers, chills, chest pain, shortness breath, headache, abdominal pain. No dysuria or hematuria.  Objective: Filed Vitals:   05/31/15 1200 05/31/15 1300 05/31/15 1400 05/31/15 1708  BP: 135/93  154/106 131/101  Pulse: 115 115 119 113  Temp: 98.1  F (36.7 C)   98.7 F (37.1 C)  TempSrc: Oral   Oral  Resp: 14 17 18 18   Height:      Weight:      SpO2: 100% 100% 100% 100%    Intake/Output Summary (Last 24 hours) at 05/31/15 1836 Last data filed at 05/31/15 1631  Gross  per 24 hour  Intake 7007.77 ml  Output    800 ml  Net 6207.77 ml   Weight change:  Exam:   General:  Pt is alert, follows commands appropriately, not in acute distress  HEENT: No icterus, No thrush, No neck mass, [place]/AT  Cardiovascular: RRR, S1/S2, no rubs, no gallops  Respiratory: CTA bilaterally, no wheezing, no crackles, no rhonchi  Abdomen: Soft/+BS, non tender, non distended, no guarding  Extremities: No edema, No lymphangitis, No petechiae, No rashes, no synovitis   Data Reviewed: I have personally reviewed following labs and imaging studies Basic Metabolic Panel:  Recent Labs Lab 05/28/15 1657  05/30/15 0527  05/30/15 1657 05/30/15 2049 05/31/15 0102 05/31/15 0509 05/31/15 1249  NA  --   < > 135  < > 135 136 139 138 138  K  --   < > 5.1  < > 3.8 3.6 3.3* 3.4* 3.2*  CL  --   < > 105  < > 109 109 116* 111 109  CO2  --   < > <7*  < > 12* 13* 15* 16* 21*  GLUCOSE  --   < > 370*  < > 159* 153* 147* 184* 137*  BUN  --   < > 6  < > 6 6 5* <5* <5*  CREATININE  --   < > 0.95  < > 0.70 0.68 0.56 0.52 0.50  CALCIUM  --   < > 9.2  < > 9.1 9.3 8.0* 7.7* 8.6*  MG 1.5*  --  1.7  --   --   --   --   --   --   PHOS 2.1*  --   --   --   --   --   --   --   --   < > = values in this interval not displayed. Liver Function Tests:  Recent Labs Lab 05/25/15 1923 05/26/15 1315 05/27/15 1231 05/28/15 1034  AST 18 17 21 19   ALT 11* 13* 13* 13*  ALKPHOS 78 73 83 82  BILITOT 1.1 1.3* 1.1 1.1  PROT 6.6 6.3* 7.2 6.7  ALBUMIN 3.6 3.4* 4.0 3.7    Recent Labs Lab 05/25/15 1923 05/26/15 1315 05/27/15 1231 05/28/15 1034  LIPASE 13 12 13 16    No results for input(s): AMMONIA in the last 168 hours. Coagulation Profile: No results for input(s):  INR, PROTIME in the last 168 hours. CBC:  Recent Labs Lab 05/25/15 1923 05/26/15 1315 05/27/15 1231 05/28/15 1034 05/29/15 0529  WBC 5.2 5.2 4.5 7.7 4.7  NEUTROABS  --  3.5  --   --   --   HGB 12.4 11.4* 12.7 12.4 11.7*  HCT 36.4 33.1* 36.7 36.1 33.8*  MCV 89.2 88.0 88.4 90.0 88.7  PLT 371 320 362 336 351   Cardiac Enzymes: No results for input(s): CKTOTAL, CKMB, CKMBINDEX, TROPONINI in the last 168 hours. BNP: Invalid input(s): POCBNP CBG:  Recent Labs Lab 05/31/15 1140 05/31/15 1247 05/31/15 1403 05/31/15 1509 05/31/15 1729  GLUCAP 127* 116* 142* 116* 166*   HbA1C: No results for input(s): HGBA1C in the last 72 hours. Urine analysis:    Component Value Date/Time   COLORURINE YELLOW 05/28/2015 [place] 05/28/2015 1145   LABSPEC 1.028 05/28/2015 1145   PHURINE 6.0 05/28/2015 1145   GLUCOSEU >1000* 05/28/2015 1145   GLUCOSEU >=1000 11/07/2012 1205   HGBUR NEGATIVE 05/28/2015 1145   BILIRUBINUR NEGATIVE 05/28/2015 1145   KETONESUR >80* 05/28/2015  [place] 05/28/2015 1145   UROBILINOGEN 0.2 11/24/2014 1045   NITRITE NEGATIVE 05/28/2015 1145   LEUKOCYTESUR NEGATIVE 05/28/2015 1145   Sepsis Labs: @LABRCNTIP (QA348G) )No results found for this or any previous visit (from the past 240 hour(s)).   Scheduled Meds: . chlorhexidine  15 mL Mouth/Throat BID  . insulin aspart  0-5 Units Subcutaneous QHS  . insulin aspart  0-9 Units Subcutaneous TID WC  . insulin glargine  20 Units Subcutaneous Daily  . metoCLOPramide (REGLAN) injection  10 mg Intravenous Q6H  . mometasone-formoterol  2 puff Inhalation BID  . pantoprazole (PROTONIX) IV  40 mg Intravenous Q12H   Continuous Infusions: . 0.9 % NaCl with KCl 20 mEq / L 100 mL/hr at 05/31/15 1420    Procedures/Studies: No results found.  Creola Krotz, DO  [facility] Pager [phone]  If 7PM-7AM, please contact night-coverage [email or web address] Password  [facility] 05/31/2015, 6:36 PM   LOS: 1 day

## 2015-06-01 LAB — BASIC METABOLIC PANEL
Anion gap: 10 (ref 5–15)
BUN: <5 mg/dL — ABNORMAL LOW (ref 6–20)
CALCIUM: 8.6 mg/dL — AB (ref 8.9–10.3)
CO2: 23 mmol/L (ref 22–32)
CREATININE: 0.49 mg/dL (ref 0.44–1.00)
Chloride: 108 mmol/L (ref 101–111)
GFR calc non Af Amer: >60 mL/min (ref >60)
Glucose, Bld: 233 mg/dL — ABNORMAL HIGH (ref 65–99)
Potassium: 3.6 mmol/L (ref 3.5–5.1)
Sodium: 141 mmol/L (ref 135–145)

## 2015-06-01 LAB — CBC
HCT: 34.6 % — ABNORMAL LOW (ref 36.0–46.0)
Hemoglobin: 12.1 g/dL (ref 12.0–15.0)
MCH: 30.6 pg (ref 26.0–34.0)
MCHC: 35 g/dL (ref 30.0–36.0)
MCV: 87.4 fL (ref 78.0–100.0)
PLATELETS: 353 10*3/uL (ref 150–400)
RBC: 3.96 MIL/uL (ref 3.87–5.11)
RDW: 12.6 % (ref 11.5–15.5)
WBC: 4.8 10*3/uL (ref 4.0–10.5)

## 2015-06-01 LAB — GLUCOSE, CAPILLARY
GLUCOSE-CAPILLARY: 109 mg/dL — AB (ref 65–99)
Glucose-Capillary: 248 mg/dL — ABNORMAL HIGH (ref 65–99)
Glucose-Capillary: 202 mg/dL — ABNORMAL HIGH (ref 65–99)
Glucose-Capillary: 60 mg/dL — ABNORMAL LOW (ref 65–99)
Glucose-Capillary: 135 mg/dL — ABNORMAL HIGH (ref 65–99)

## 2015-06-01 MED ORDER — BISACODYL 10 MG RE SUPP
10.0000 mg | Freq: Once | RECTAL | Status: AC
  Administered 2015-06-01: 10 mg via RECTAL
  Filled 2015-06-01: qty 1

## 2015-06-01 MED ORDER — ONDANSETRON HCL 4 MG/2ML IJ SOLN
4.0000 mg | Freq: Four times a day (QID) | INTRAMUSCULAR | Status: DC
  Administered 2015-06-01 – 2015-06-03 (×8): 4 mg via INTRAVENOUS
  Filled 2015-06-01 (×8): qty 2

## 2015-06-01 MED ORDER — INSULIN ASPART 100 UNIT/ML ~~LOC~~ SOLN
0.0000 [IU] | SUBCUTANEOUS | Status: DC
  Administered 2015-06-02: 3 [IU] via SUBCUTANEOUS
  Administered 2015-06-02: 1 [IU] via SUBCUTANEOUS

## 2015-06-01 MED ORDER — INSULIN GLARGINE 100 UNIT/ML ~~LOC~~ SOLN
24.0000 [IU] | Freq: Every day | SUBCUTANEOUS | Status: DC
  Administered 2015-06-02 – 2015-06-03 (×2): 24 [IU] via SUBCUTANEOUS
  Filled 2015-06-01 (×2): qty 0.24

## 2015-06-01 MED ORDER — INSULIN ASPART 100 UNIT/ML ~~LOC~~ SOLN: 0.0000 [IU] | Freq: Three times a day (TID) | SUBCUTANEOUS | Status: DC

## 2015-06-01 MED ORDER — MORPHINE SULFATE (PF) 2 MG/ML IV SOLN
2.0000 mg | Freq: Four times a day (QID) | INTRAVENOUS | Status: DC | PRN
  Administered 2015-06-01 – 2015-06-03 (×3): 2 mg via INTRAVENOUS
  Filled 2015-06-01 (×3): qty 1

## 2015-06-01 NOTE — Progress Notes (Signed)
PROGRESS NOTE  [name] [id number] DOB: 11-Jun-1990 DOA: 05/28/2015 PCP: Vicenta Aly, FNP Brief History:  25 year old female with a history of diabetes mellitus type 1, recent pancreatitis, liver adenomas, anxiety, hepatic steatosis, asthma presented with 2-3 day history of nausea, vomiting. The patient has had numerous admissions in the past 6 months secondary to DKA as well as for N/V. Most recently, the patient was discharged after an admission from 03/21/2015 through 03/25/2015 and an admission from 04/08/2015 through 04/15/2015 for DKA. She was also recently discharged on 05/26/2015 for treatment of intractable nausea and vomiting. During her admissions, it has been characteristic for slow recovery due to slow recovering metabolic acidosis which is initially gapped and subsequently nongapped. In addition, her admissions have been characterized by profound volume depletion requiring significant fluid resuscitation which subsequently resulted in improvement in the patient's sinus tachycardia and metabolic acidosis. Finally, her previous admissions have been characterized by opioid dependence for her "abdominal pain"which has resulted in exacerbation of her presumed gastroparesis resulting in slow recovery of her nausea and vomiting.  Assessment/Plan: DKA, type 1 -05/30/2015 labs suggest DKA-CBG 370 and elevated anion gap --Patient was started on intravenous fluids and intravenous insulin protocol -BMP every 4 hours was ordered -Anion gap subsequently closed, and the patient was transitioned to subcutaneous insulin -Transferred to regular medical floor  Nausea and vomiting/Abdominal pain -likely gastroparesis related to her DM  -04/21/2015 CT abdomen and pelvis--stable hepatic masses, normal pancreas, normal bowel wall thickening or inflammation, normal spleen and pancreas -09/23/2014 EGD--diffuse gastritis without clear explanation of the patient's  hematemesis -appreciate GI input-->symptomatic treatment; low yield for endoscopy -Continue IV fluid, metoclopramide -5/13 Minimize opioids--decrease frequency to q 6 hrs -06/01/15--make npo except sips with meds -scheduled zofran q 6hrs -continue PPI  Hematemesis -Hemoglobin appears to be stable, likely secondary to forceful vomiting, 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 -Patient had same complaint on numerous previous admissions -09/23/2014 EGD--diffuse gastritis without clear explanation of the patient's hematemesis -Although the patient has dilution drop in her hemoglobin, her hemoglobin overall remained stable when compared to her multiple admissions -Continue to trend hemoglobin--stable -Baseline hemoglobin 11-12  DM type I, uncontrolled -Continue fluid resuscitation -04/13/2015 hemoglobin A1c 9.3  -03/22/2015 UDS positive for opiates -increase lantus to 24 units  Asthma -No wheezing or stridor -Stable on room air -Continue LABA -continue singulair  Sinus tachycardia/Dehydration -Secondary to stress response from the patient's volume depletion, vomiting, and pain -As discussed, aggressive fluid resuscitation and conservative measures usually results in an improvement -increase IVF to 125 cc/hr -Echocardiogram 05/24/2014 showed EF Q000111Q, grade 1 diastolic dysfunction -123456  Elevated blood pressure -Likely situational without diagnosis of hypertension -Overall, the preponderance of her blood pressures eventually normalizes -Do not plan to start anti-HTN regimen   Disposition Plan: 2-3 days Family Communication: Mother updated on phone 05/30/15  Consultants: [facility]  Code Status: FULL  Subjective: Patient continues to have nausea and vomiting although RN states that most of it appears to be just saliva. No hematemesis. She denies fevers, chills, headache, neck pain, chest pain, short of breath, diarrhea, hematochezia, melena. No dysuria  or hematuria.  Objective: Filed Vitals:   05/31/15 1708 05/31/15 2126 06/01/15 0540 06/01/15 1412  BP: 131/101 116/74 124/88 136/99  Pulse: 113 110 101 114  Temp: 98.7 F (37.1 C) 98.6 F (37 C) 98.5 F (36.9 C) 98.6 F (37 C)  TempSrc: Oral Oral Oral Oral  Resp: 18 18 18 20   Height:  Weight:      SpO2: 100% 100% 100% 100%    Intake/Output Summary (Last 24 hours) at 06/01/15 1552 Last data filed at 06/01/15 0300  Gross per 24 hour  Intake 1366.67 ml  Output      0 ml  Net 1366.67 ml   Weight change:  Exam:   General:  Pt is alert, follows commands appropriately, not in acute distress  HEENT: No icterus, No thrush, No neck mass, [place]/AT  Cardiovascular: RRR, S1/S2, no rubs, no gallops  Respiratory: CTA bilaterally, no wheezing, no crackles, no rhonchi  Abdomen: Soft/+BS, non tender, non distended, no guarding  Extremities: No edema, No lymphangitis, No petechiae, No rashes, no synovitis   Data Reviewed: I have personally reviewed following labs and imaging studies Basic Metabolic Panel:  Recent Labs Lab 05/28/15 1657  05/30/15 0527  05/30/15 2049 05/31/15 0102 05/31/15 0509 05/31/15 1249 06/01/15 0503  NA  --   < > 135  < > 136 139 138 138 141  K  --   < > 5.1  < > 3.6 3.3* 3.4* 3.2* 3.6  CL  --   < > 105  < > 109 116* 111 109 108  CO2  --   < > <7*  < > 13* 15* 16* 21* 23  GLUCOSE  --   < > 370*  < > 153* 147* 184* 137* 233*  BUN  --   < > 6  < > 6 5* <5* <5* <5*  CREATININE  --   < > 0.95  < > 0.68 0.56 0.52 0.50 0.49  CALCIUM  --   < > 9.2  < > 9.3 8.0* 7.7* 8.6* 8.6*  MG 1.5*  --  1.7  --   --   --   --   --   --   PHOS 2.1*  --   --   --   --   --   --   --   --   < > = values in this interval not displayed. Liver Function Tests:  Recent Labs Lab 05/25/15 1923 05/26/15 1315 05/27/15 1231 05/28/15 1034  AST 18 17 21 19   ALT 11* 13* 13* 13*  ALKPHOS 78 73 83 82  BILITOT 1.1 1.3* 1.1 1.1  PROT 6.6 6.3* 7.2 6.7  ALBUMIN 3.6 3.4* 4.0  3.7    Recent Labs Lab 05/25/15 1923 05/26/15 1315 05/27/15 1231 05/28/15 1034  LIPASE 13 12 13 16    No results for input(s): AMMONIA in the last 168 hours. Coagulation Profile: No results for input(s): INR, PROTIME in the last 168 hours. CBC:  Recent Labs Lab 05/26/15 1315 05/27/15 1231 05/28/15 1034 05/29/15 0529 06/01/15 0503  WBC 5.2 4.5 7.7 4.7 4.8  NEUTROABS 3.5  --   --   --   --   HGB 11.4* 12.7 12.4 11.7* 12.1  HCT 33.1* 36.7 36.1 33.8* 34.6*  MCV 88.0 88.4 90.0 88.7 87.4  PLT 320 362 336 351 353   Cardiac Enzymes: No results for input(s): CKTOTAL, CKMB, CKMBINDEX, TROPONINI in the last 168 hours. BNP: Invalid input(s): POCBNP CBG:  Recent Labs Lab 05/31/15 1509 05/31/15 1729 05/31/15 2124 06/01/15 0745 06/01/15 1153  GLUCAP 116* 166* 157* 248* 202*   HbA1C: No results for input(s): HGBA1C in the last 72 hours. Urine analysis:    Component Value Date/Time   COLORURINE YELLOW 05/28/2015 [place] 05/28/2015 1145   LABSPEC 1.028 05/28/2015 1145   PHURINE  6.0 05/28/2015 1145   GLUCOSEU >1000* 05/28/2015 1145   GLUCOSEU >=1000 11/07/2012 1205   HGBUR NEGATIVE 05/28/2015 1145   BILIRUBINUR NEGATIVE 05/28/2015 1145   KETONESUR >80* 05/28/2015 1145   PROTEINUR NEGATIVE 05/28/2015 1145   UROBILINOGEN 0.2 11/24/2014 1045   NITRITE NEGATIVE 05/28/2015 1145   LEUKOCYTESUR NEGATIVE 05/28/2015 1145   Sepsis Labs: @LABRCNTIP (procalcitonin:4,lacticidven:4) )No results found for this or any previous visit (from the past 240 hour(s)).   Scheduled Meds: . chlorhexidine  15 mL Mouth/Throat BID  . insulin aspart  0-9 Units Subcutaneous Q4H  . [START ON 06/02/2015] insulin glargine  24 Units Subcutaneous Daily  . metoCLOPramide (REGLAN) injection  10 mg Intravenous Q6H  . ondansetron (ZOFRAN) IV  4 mg Intravenous Q6H  . pantoprazole (PROTONIX) IV  40 mg Intravenous Q12H   Continuous Infusions: . 0.9 % NaCl with KCl 20 mEq / L 100 mL/hr  at 06/01/15 1043    Procedures/Studies: No results found.  Arabela Basaldua, DO  [facility] Pager [phone]  If 7PM-7AM, please contact night-coverage [email or web address] Password [email or web address] 06/01/2015, 3:52 PM   LOS: 2 days

## 2015-06-02 DIAGNOSIS — R112 Nausea with vomiting, unspecified: Secondary | ICD-10-CM | POA: Insufficient documentation

## 2015-06-02 DIAGNOSIS — R739 Hyperglycemia, unspecified: Secondary | ICD-10-CM | POA: Insufficient documentation

## 2015-06-02 LAB — BASIC METABOLIC PANEL
Anion gap: 10 (ref 5–15)
CALCIUM: 8.5 mg/dL — AB (ref 8.9–10.3)
CO2: 26 mmol/L (ref 22–32)
Chloride: 105 mmol/L (ref 101–111)
Creatinine, Ser: 0.49 mg/dL (ref 0.44–1.00)
Glucose, Bld: 94 mg/dL (ref 65–99)
Potassium: 2.9 mmol/L — ABNORMAL LOW (ref 3.5–5.1)
SODIUM: 141 mmol/L (ref 135–145)

## 2015-06-02 LAB — GLUCOSE, CAPILLARY
GLUCOSE-CAPILLARY: 72 mg/dL (ref 65–99)
GLUCOSE-CAPILLARY: 145 mg/dL — AB (ref 65–99)
GLUCOSE-CAPILLARY: 216 mg/dL — AB (ref 65–99)
Glucose-Capillary: 122 mg/dL — ABNORMAL HIGH (ref 65–99)
Glucose-Capillary: 94 mg/dL (ref 65–99)

## 2015-06-02 MED ORDER — POTASSIUM CHLORIDE 10 MEQ/100ML IV SOLN
10.0000 meq | INTRAVENOUS | Status: AC
Start: 2015-06-02 — End: 2015-06-02
  Administered 2015-06-02 (×5): 10 meq via INTRAVENOUS
  Filled 2015-06-02 (×5): qty 100

## 2015-06-02 MED ORDER — SODIUM CHLORIDE 0.9 % IV SOLN: INTRAVENOUS | Status: DC

## 2015-06-02 MED ORDER — INSULIN ASPART 100 UNIT/ML ~~LOC~~ SOLN: 0.0000 [IU] | Freq: Every day | SUBCUTANEOUS | Status: DC

## 2015-06-02 MED ORDER — INSULIN ASPART 100 UNIT/ML ~~LOC~~ SOLN
0.0000 [IU] | Freq: Three times a day (TID) | SUBCUTANEOUS | Status: DC
  Administered 2015-06-02 – 2015-06-03 (×2): 1 [IU] via SUBCUTANEOUS

## 2015-06-02 MED ORDER — POTASSIUM CHLORIDE IN NACL 20-0.9 MEQ/L-% IV SOLN
INTRAVENOUS | Status: DC
  Administered 2015-06-02 – 2015-06-03 (×3): via INTRAVENOUS
  Filled 2015-06-02 (×4): qty 1000

## 2015-06-02 MED ORDER — DEXTROSE-NACL 5-0.45 % IV SOLN
INTRAVENOUS | Status: DC
  Administered 2015-06-02 (×2): via INTRAVENOUS

## 2015-06-02 NOTE — Progress Notes (Signed)
Hypoglycemic Event  CBG: 60  Treatment: D50 IV 25 mL  Symptoms: None  Follow-up CBG: Time:2202 CBG Result:135  Possible Reasons for Event: Inadequate meal intake  Comments/MD notified:    Conception Oms

## 2015-06-02 NOTE — Progress Notes (Signed)
Called by RN the patient has decided to stay in the hospital. -Start clear liquid diet -A.m. BMP -Change CBGs to before meals and at bedtime -Continue intravenous fluids for now until the patient is able to tolerate po DTat

## 2015-06-02 NOTE — Discharge Summary (Signed)
Physician Discharge Summary  [name] [id number] DOB: 09/14/1990 DOA: 05/28/2015  PCP: Vicenta Aly, FNP  Admit date: 05/28/2015 Discharge date: 06/02/2015  PATIENT LEAVING AGAINST MEDICAL ADVICE -discussed risks including but not limited to worsening glycemic control, DKA, and death.  Pt expressed understanding and wishes to leave AMA  Discharge Diagnoses:  DKA, type 1 -05/30/2015 labs suggest DKA-CBG 370 and elevated anion gap --Patient was started on intravenous fluids and intravenous insulin protocol -BMP every 4 hours was ordered -Anion gap subsequently closed, and the patient was transitioned to subcutaneous insulin -Transferred to regular medical floor  Nausea and vomiting/Abdominal pain -likely gastroparesis related to her DM  -04/21/2015 CT abdomen and pelvis--stable hepatic masses, normal pancreas, normal bowel wall thickening or inflammation, normal spleen and pancreas -09/23/2014 EGD--diffuse gastritis without clear explanation of the patient's hematemesis -appreciate GI input-->symptomatic treatment; low yield for endoscopy -Continue IV fluid, metoclopramide -5/13 Minimize opioids--decrease frequency to q 6 hrs -06/01/15--make npo except sips with meds -scheduled zofran q 6hrs -continue PPI -06/02/2015--overall vomiting is improving, but has not yet demonstrated she is able to tolerate any liquids or solid food--patient demanded to leave AGAINST MEDICAL ADVICE  Hematemesis -Hemoglobin appears to be stable, likely secondary to forceful vomiting, 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 -Patient had same complaint on numerous previous admissions -09/23/2014 EGD--diffuse gastritis without clear explanation of the patient's hematemesis -Although the patient has dilution drop in her hemoglobin, her hemoglobin overall remained stable when compared to her multiple admissions -Continue to trend hemoglobin--stable -Baseline hemoglobin 11-12  DM type I,  uncontrolled -Continue fluid resuscitation -04/13/2015 hemoglobin A1c 9.3  -03/22/2015 UDS positive for opiates -increase lantus to 24 units  Asthma -No wheezing or stridor -Stable on room air -Continue LABA -continue singulair  Sinus tachycardia/Dehydration -Secondary to stress response from the patient's volume depletion, vomiting, and pain -As discussed, aggressive fluid resuscitation and conservative measures usually results in an improvement -increase IVF to 125 cc/hr -Echocardiogram 05/24/2014 showed EF Q000111Q, grade 1 diastolic dysfunction -[id number]  Elevated blood pressure -Likely situational without diagnosis of hypertension -Overall, the preponderance of her blood pressures eventually normalizes -Do not plan to start anti-HTN regimen  Discharge Condition: Leaving AGAINST MEDICAL ADVICE  Disposition:  leaving AGAINST MEDICAL ADVICE  Diet: Carbohydrate modified Wt Readings from Last 3 Encounters:  05/28/15 51.7 kg (113 lb 15.7 oz)  05/27/15 51.71 kg (114 lb)  05/25/15 52.028 kg (114 lb 11.2 oz)    History of present illness:  25 year old female with a history of diabetes mellitus type 1, recent pancreatitis, liver adenomas, anxiety, hepatic steatosis, asthma presented with 2-3 day history of nausea, vomiting. The patient has had numerous admissions in the past 6 months secondary to DKA as well as for N/V. Most recently, the patient was discharged after an admission from 03/21/2015 through 03/25/2015 and an admission from 04/08/2015 through 04/15/2015 for DKA. She was also recently discharged on 05/26/2015 for treatment of intractable nausea and vomiting. During her admissions, it has been characteristic for slow recovery due to slow recovering metabolic acidosis which is initially gapped and subsequently nongapped. In addition, her admissions have been characterized by profound volume depletion requiring significant fluid resuscitation which subsequently  resulted in improvement in the patient's sinus tachycardia and metabolic acidosis. Finally, her previous admissions have been characterized by opioid dependence for her "abdominal pain"which has resulted in exacerbation of her presumed gastroparesis resulting in slow recovery of her nausea and vomiting.  However, when the patient's opioids are decreased in dose and frequency, the patient's vomiting improves.  In addition when the patient's opioids are decreased, the patient has had a propensity to leave [place] in the past as well as this admission.  Discharge Exam: Filed Vitals:   06/02/15 0458 06/02/15 1309  BP: 142/93 137/98  Pulse: 102 96  Temp: 98.7 F (37.1 C) 98.4 F (36.9 C)  Resp:  20   Filed Vitals:   06/01/15 1412 06/01/15 2102 06/02/15 0458 06/02/15 1309  BP: 136/99 140/96 142/93 137/98  Pulse: 114 113 102 96  Temp: 98.6 F (37 C) 99 F (37.2 C) 98.7 F (37.1 C) 98.4 F (36.9 C)  TempSrc: Oral Oral Oral Oral  Resp: 20 20  20   Height:      Weight:      SpO2: 100% 98% 100% 100%   General: A&O x 3, NAD, pleasant, cooperative Cardiovascular: RRR, no rub, no gallop, no S3 Respiratory: CTAB, no wheeze, no rhonchi Abdomen:soft, nontender, nondistended, positive bowel sounds Extremities: No edema, No lymphangitis, no petechiae  Discharge Instructions     Medication List    STOP taking these medications        oxyCODONE-acetaminophen 5-325 MG tablet  Commonly known as:  PERCOCET/ROXICET      TAKE these medications        albuterol 108 (90 Base) MCG/ACT inhaler  Commonly known as:  PROVENTIL HFA;VENTOLIN HFA  Inhale 2 puffs into the lungs every 6 (six) hours as needed for shortness of breath.     albuterol (2.5 MG/3ML) 0.083% nebulizer solution  Commonly known as:  PROVENTIL  Take 2.5 mg by nebulization every 6 (six) hours as needed for shortness of breath.     budesonide-formoterol 160-4.5 MCG/ACT inhaler  Commonly known as:  SYMBICORT  Inhale  2 puffs into the lungs 2 (two) times daily.     EPIPEN 0.3 mg/0.3 mL Soaj injection  Generic drug:  EPINEPHrine  Inject 0.3 mg into the muscle once. As needed for allergic reaction     insulin aspart 100 UNIT/ML FlexPen  Commonly known as:  NOVOLOG FLEXPEN  INJECT 0-20 UNITS INTO THE SKIN AS DIRECTED. SSI - CARB SCALE, 0-20 UNITS     Insulin Glargine 100 UNIT/ML Solostar Pen  Commonly known as:  LANTUS SOLOSTAR  Inject 24 Units into the skin daily at 10 pm.     metoCLOPramide 10 MG tablet  Commonly known as:  REGLAN  Take 1 tablet (10 mg total) by mouth 3 (three) times daily before meals.     montelukast 10 MG tablet  Commonly known as:  SINGULAIR  Take 10 mg by mouth daily.     omeprazole 40 MG capsule  Commonly known as:  PRILOSEC  Take 1 capsule (40 mg total) by mouth daily.     ondansetron 4 MG tablet  Commonly known as:  ZOFRAN  Take 1 tablet (4 mg total) by mouth every 6 (six) hours.     promethazine 25 MG tablet  Commonly known as:  PHENERGAN  Take 1 tablet (25 mg total) by mouth every 6 (six) hours as needed for nausea or vomiting.         The results of significant diagnostics from this hospitalization (including imaging, microbiology, ancillary and laboratory) are listed below for reference.    Significant Diagnostic Studies: No results found.   Microbiology: No results found for this or any previous visit (from the past 240 hour(s)).   Labs: Basic Metabolic Panel:  Recent Labs Lab 05/28/15 1657  05/30/15 0527  05/31/15 0102 05/31/15  [id number] 05/31/15 1249 06/01/15 0503 06/02/15 0529  NA  --   < > 135  < > 139 138 138 141 141  K  --   < > 5.1  < > 3.3* 3.4* 3.2* 3.6 2.9*  CL  --   < > 105  < > 116* 111 109 108 105  CO2  --   < > <7*  < > 15* 16* 21* 23 26  GLUCOSE  --   < > 370*  < > 147* 184* 137* 233* 94  BUN  --   < > 6  < > 5* <5* <5* <5* <5*  CREATININE  --   < > 0.95  < > 0.56 0.52 0.50 0.49 0.49  CALCIUM  --   < > 9.2  < > 8.0* 7.7* 8.6*  8.6* 8.5*  MG 1.5*  --  1.7  --   --   --   --   --   --   PHOS 2.1*  --   --   --   --   --   --   --   --   < > = values in this interval not displayed. Liver Function Tests:  Recent Labs Lab 05/27/15 1231 05/28/15 1034  AST 21 19  ALT 13* 13*  ALKPHOS 83 82  BILITOT 1.1 1.1  PROT 7.2 6.7  ALBUMIN 4.0 3.7    Recent Labs Lab 05/27/15 1231 05/28/15 1034  LIPASE 13 16   No results for input(s): AMMONIA in the last 168 hours. CBC:  Recent Labs Lab 05/27/15 1231 05/28/15 1034 05/29/15 0529 06/01/15 0503  WBC 4.5 7.7 4.7 4.8  HGB 12.7 12.4 11.7* 12.1  HCT 36.7 36.1 33.8* 34.6*  MCV 88.4 90.0 88.7 87.4  PLT 362 336 351 353   Cardiac Enzymes: No results for input(s): CKTOTAL, CKMB, CKMBINDEX, TROPONINI in the last 168 hours. BNP: Invalid input(s): POCBNP CBG:  Recent Labs Lab 06/01/15 2202 06/01/15 2358 06/02/15 0406 06/02/15 0736 06/02/15 1133  GLUCAP 135* 94 72 145* 216*    Time coordinating discharge:  Greater than 30 minutes  Signed:  Lorance Pickeral, DO [facility] Pager: [phone] 06/02/2015, 4:38 PM

## 2015-06-02 NOTE — Progress Notes (Deleted)
PT had cbg of 60. PT was given 25 ml of D 50. cbg rechecked and went to 135. Will continue to monitor.

## 2015-06-03 DIAGNOSIS — R739 Hyperglycemia, unspecified: Secondary | ICD-10-CM

## 2015-06-03 LAB — BASIC METABOLIC PANEL
Anion gap: 4 — ABNORMAL LOW (ref 5–15)
CALCIUM: 8.5 mg/dL — AB (ref 8.9–10.3)
CO2: 27 mmol/L (ref 22–32)
Chloride: 110 mmol/L (ref 101–111)
Creatinine, Ser: 0.52 mg/dL (ref 0.44–1.00)
GFR calc Af Amer: >60 mL/min (ref >60)
GLUCOSE: 97 mg/dL (ref 65–99)
Potassium: 3.3 mmol/L — ABNORMAL LOW (ref 3.5–5.1)
Sodium: 141 mmol/L (ref 135–145)

## 2015-06-03 LAB — GLUCOSE, CAPILLARY
GLUCOSE-CAPILLARY: 146 mg/dL — AB (ref 65–99)
Glucose-Capillary: 125 mg/dL — ABNORMAL HIGH (ref 65–99)
Glucose-Capillary: 120 mg/dL — ABNORMAL HIGH (ref 65–99)

## 2015-06-03 MED ORDER — POTASSIUM CHLORIDE 10 MEQ/100ML IV SOLN
10.0000 meq | INTRAVENOUS | Status: AC
  Administered 2015-06-03 (×3): 10 meq via INTRAVENOUS
  Filled 2015-06-03 (×3): qty 100

## 2015-06-03 NOTE — Care Management Note (Signed)
Case Management Note  Patient Details  Name: [name] MRN: [id number] Date of Birth: [birth year]  Subjective/Objective:                    Action/Plan:d/c home no needs or orders.   Expected Discharge Date:   (unknown)               Expected Discharge Plan:  Home/Self Care  In-House Referral:     Discharge planning Services  CM Consult  Post Acute Care Choice:    Choice offered to:     DME Arranged:    DME Agency:     HH Arranged:    [place] Agency:     Status of Service:  Completed, signed off  Medicare Important Message Given:    Date Medicare IM Given:    Medicare IM give by:    Date Additional Medicare IM Given:    Additional Medicare Important Message give by:     If discussed at [place] of Stay Meetings, dates discussed:    Additional Comments:  Dessa Phi, RN 06/03/2015, 2:15 PM

## 2015-06-03 NOTE — Discharge Summary (Signed)
Physician Discharge Summary  Margrie Ballard [id number] DOB: [birth year] DOA: 05/28/2015  PCP: Vicenta Aly, FNP  Admit date: 05/28/2015 Discharge date: 06/03/2015  Recommendations for Outpatient Follow-up:  1. Pt will need to follow up with PCP in 2 weeks post discharge 2. Please obtain BMP and CBC in 1-2 weeks   Discharge Diagnoses:  DKA, type 1 -05/30/2015 labs suggest DKA-CBG 370 and elevated anion gap --Patient was started on intravenous fluids and intravenous insulin protocol -BMP every 4 hours was ordered -Anion gap subsequently closed, and the patient was transitioned to subcutaneous insulin -diet gradually advanced which pt tolerated  Nausea and vomiting/Abdominal pain -likely gastroparesis related to her DM  -04/21/2015 CT abdomen and pelvis--stable hepatic masses, normal pancreas, normal bowel wall thickening or inflammation, normal spleen and pancreas -09/23/2014 EGD--diffuse gastritis without clear explanation of the patient's hematemesis -appreciate GI input-->symptomatic treatment; low yield for endoscopy -Continue IV fluid, metoclopramide -5/13 Minimize opioids--decrease frequency to q 6 hrs -06/01/15--make npo except sips with meds -scheduled zofran q 6hrs -continue PPI -06/02/2015--overall vomiting is improving-->diet gradually advanced which pt tolerated  Hematemesis -Hemoglobin appears to be stable, likely secondary to forceful vomiting, 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 -Patient had same complaint on numerous previous admissions -09/23/2014 EGD--diffuse gastritis without clear explanation of the patient's hematemesis -Although the patient has dilution drop in her hemoglobin, her hemoglobin overall remained stable when compared to her multiple admissions -Continue to trend hemoglobin--stable -Baseline hemoglobin 11-12 -no further hematemesis x 3 days prior to admission  DM type I, uncontrolled -Continue fluid resuscitation -04/13/2015  hemoglobin A1c 9.3  -03/22/2015 UDS positive for opiates -increase lantus to 24 units  Asthma -No wheezing or stridor -Stable on room air -Continue LABA--pt refuses -continue singulair  Sinus tachycardia/Dehydration -Secondary to stress response from the patient's volume depletion, vomiting, and pain -As discussed, aggressive fluid resuscitation and conservative measures usually results in an improvement -increase IVF to 125 cc/hr -Echocardiogram 05/24/2014 showed EF Q000111Q, grade 1 diastolic dysfunction -123456  Elevated blood pressure -Likely situational without diagnosis of hypertension -Overall, the preponderance of her blood pressures eventually normalizes -Do not plan to start anti-HTN regimen  Discharge Condition: stable  Disposition: home  Diet:carb modified Wt Readings from Last 3 Encounters:  05/28/15 51.7 kg (113 lb 15.7 oz)  05/27/15 51.71 kg (114 lb)  05/25/15 52.028 kg (114 lb 11.2 oz)    History of present illness:  25 year old female with a history of diabetes mellitus type 1, recent pancreatitis, liver adenomas, anxiety, hepatic steatosis, asthma presented with 2-3 day history of nausea, vomiting. The patient has had numerous admissions in the past 6 months secondary to DKA as well as for N/V. Most recently, the patient was discharged after an admission from 03/21/2015 through 03/25/2015 and an admission from 04/08/2015 through 04/15/2015 for DKA. She was also recently discharged on 05/26/2015 for treatment of intractable nausea and vomiting. During her admissions, it has been characteristic for slow recovery due to slow recovering metabolic acidosis which is initially gapped and subsequently nongapped. In addition, her admissions have been characterized by profound volume depletion requiring significant fluid resuscitation which subsequently resulted in improvement in the patient's sinus tachycardia and metabolic acidosis. Finally, her previous  admissions have been characterized by opioid dependence for her "abdominal pain"which has resulted in exacerbation of her presumed gastroparesis resulting in slow recovery of her nausea and vomiting. However, when the patient's opioids are decreased in dose and frequency, the patient's vomiting improves. In addition when the patient's opioids are decreased, the patient has had  a propensity to leave [place] in the past as well as this admission which she threatened during this admission, but she ultimately agreed to stay.  Her diet was gradually advanced which she tolerated.  Discharge Exam: Filed Vitals:   06/02/15 2145 06/03/15 0633  BP: 124/88 123/89  Pulse: 100 97  Temp: 99 F (37.2 C) 98.3 F (36.8 C)  Resp: 15 14   Filed Vitals:   06/02/15 0458 06/02/15 1309 06/02/15 2145 06/03/15 0633  BP: 142/93 137/98 124/88 123/89  Pulse: 102 96 100 97  Temp: 98.7 F (37.1 C) 98.4 F (36.9 C) 99 F (37.2 C) 98.3 F (36.8 C)  TempSrc: Oral Oral Oral Oral  Resp:  20 15 14   Height:      Weight:      SpO2: 100% 100% 99% 98%   General: A&O x 3, NAD, pleasant, cooperative Cardiovascular: RRR, no rub, no gallop, no S3 Respiratory: CTAB, no wheeze, no rhonchi Abdomen:soft, nontender, nondistended, positive bowel sounds Extremities: No edema, No lymphangitis, no petechiae  Discharge Instructions     Medication List    STOP taking these medications        oxyCODONE-acetaminophen 5-325 MG tablet  Commonly known as:  PERCOCET/ROXICET      TAKE these medications        albuterol 108 (90 Base) MCG/ACT inhaler  Commonly known as:  PROVENTIL HFA;VENTOLIN HFA  Inhale 2 puffs into the lungs every 6 (six) hours as needed for shortness of breath.     albuterol (2.5 MG/3ML) 0.083% nebulizer solution  Commonly known as:  PROVENTIL  Take 2.5 mg by nebulization every 6 (six) hours as needed for shortness of breath.     budesonide-formoterol 160-4.5 MCG/ACT inhaler  Commonly  known as:  SYMBICORT  Inhale 2 puffs into the lungs 2 (two) times daily.     EPIPEN 0.3 mg/0.3 mL Soaj injection  Generic drug:  EPINEPHrine  Inject 0.3 mg into the muscle once. As needed for allergic reaction     insulin aspart 100 UNIT/ML FlexPen  Commonly known as:  NOVOLOG FLEXPEN  INJECT 0-20 UNITS INTO THE SKIN AS DIRECTED. SSI - CARB SCALE, 0-20 UNITS     Insulin Glargine 100 UNIT/ML Solostar Pen  Commonly known as:  LANTUS SOLOSTAR  Inject 24 Units into the skin daily at 10 pm.     metoCLOPramide 10 MG tablet  Commonly known as:  REGLAN  Take 1 tablet (10 mg total) by mouth 3 (three) times daily before meals.     montelukast 10 MG tablet  Commonly known as:  SINGULAIR  Take 10 mg by mouth daily.     omeprazole 40 MG capsule  Commonly known as:  PRILOSEC  Take 1 capsule (40 mg total) by mouth daily.     ondansetron 4 MG tablet  Commonly known as:  ZOFRAN  Take 1 tablet (4 mg total) by mouth every 6 (six) hours.     promethazine 25 MG tablet  Commonly known as:  PHENERGAN  Take 1 tablet (25 mg total) by mouth every 6 (six) hours as needed for nausea or vomiting.         The results of significant diagnostics from this hospitalization (including imaging, microbiology, ancillary and laboratory) are listed below for reference.    Significant Diagnostic Studies: No results found.   Microbiology: No results found for this or any previous visit (from the past 240 hour(s)).   Labs: Basic Metabolic Panel:  Recent Labs Lab  05/28/15 1657  05/30/15 0527  05/31/15 0509 05/31/15 1249 06/01/15 0503 06/02/15 0529 06/03/15 0447  NA  --   < > 135  < > 138 138 141 141 141  K  --   < > 5.1  < > 3.4* 3.2* 3.6 2.9* 3.3*  CL  --   < > 105  < > 111 109 108 105 110  CO2  --   < > <7*  < > 16* 21* 23 26 27   GLUCOSE  --   < > 370*  < > 184* 137* 233* 94 97  BUN  --   < > 6  < > <5* <5* <5* <5* <5*  CREATININE  --   < > 0.95  < > 0.52 0.50 0.49 0.49 0.52  CALCIUM  --   <  > 9.2  < > 7.7* 8.6* 8.6* 8.5* 8.5*  MG 1.5*  --  1.7  --   --   --   --   --   --   PHOS 2.1*  --   --   --   --   --   --   --   --   < > = values in this interval not displayed. Liver Function Tests:  Recent Labs Lab 05/28/15 1034  AST 19  ALT 13*  ALKPHOS 82  BILITOT 1.1  PROT 6.7  ALBUMIN 3.7    Recent Labs Lab 05/28/15 1034  LIPASE 16   No results for input(s): AMMONIA in the last 168 hours. CBC:  Recent Labs Lab 05/28/15 1034 05/29/15 0529 06/01/15 0503  WBC 7.7 4.7 4.8  HGB 12.4 11.7* 12.1  HCT 36.1 33.8* 34.6*  MCV 90.0 88.7 87.4  PLT 336 351 353   Cardiac Enzymes: No results for input(s): CKTOTAL, CKMB, CKMBINDEX, TROPONINI in the last 168 hours. BNP: Invalid input(s): POCBNP CBG:  Recent Labs Lab 06/02/15 1133 06/02/15 1654 06/02/15 2145 06/03/15 0747 06/03/15 1146  GLUCAP 216* 122* 125* 120* 146*    Time coordinating discharge:  Greater than 30 minutes  Signed:  Baily Hovanec, DO [facility] Pager: [phone] 06/03/2015, 12:42 PM

## 2015-06-03 NOTE — Progress Notes (Signed)
Nutrition Follow-up  DOCUMENTATION CODES:   Not applicable  INTERVENTION:  - Continue diet advancement as medically feasible - Encourage PO intakes  - RD will continue to monitor for needs prior to d/c  NUTRITION DIAGNOSIS:   Inadequate oral intake related to acute illness, poor appetite as evidenced by per patient/family report, meal completion < 25%. -revised  GOAL:   Patient will meet greater than or equal to 90% of their needs -unmet  MONITOR:   PO intake, Diet advancement, Weight trends, Labs, I & O's  ASSESSMENT:   [name] is a 25 y.o. female with PMH significant for uncontrolled type 1 diabetes mellitus, asthma, hypertension (not currently on any antihypertensive agent), GERD and presumed gastroparesis; who presented to the emergency department secondary to abdominal pain, intractable nausea/vomiting, dehydration and hematemesis  5/15 Diet advanced from NPO to CLD 5/14 at 1651. Pt states she had a bowl of chicken broth last night with no other intakes since diet advancement. She denies abdominal pain or nausea following intake last night and denies abdominal pain or nausea this AM. She states despite this she does not want anything to eat/drink this AM.   Per chart review, no new weight since admission. No muscle or fat wasting noted at this time. Pt does not meet criteria for malnutrition at this time based on nutrition-related parameters; will continue to monitor. Not meeting needs. Medications reviewed; sliding scale Novolog, 24 units Lantus/day, 10 mg IV Reglan every 6 hours, 4 mg IV Zofran every 6 hours. IVF: NS-20 mEq KCl @ 125 mL/hr. Labs reviewed; K: 3.3 mmol/L, BUN <5 mg/dL, Ca: 8.5 mg/dL, CBGs: 72-216 mg/dL since yesterday AM.   5/10 - Pt not feeling well during RD visit; continues to vomit and feel nauseous.  - States medication is not helping. Pt is currently NPO. - She endorses 12# wt loss PTA, however per chart, pt exhibits only a 6#/5% insignificant  wt loss over 4 months. - PTA - she was experiencing symptoms for 3-4 days, constantly vomiting for ~ 40 hours.  - Uncontrolled DM with recent HgbA1c: 9.2%. - Unable to complete Nutrition-Focused physical exam at this time.  Diet Order:  Diet clear liquid Room service appropriate?: Yes; Fluid consistency:: Thin  Skin:  Reviewed, no issues  Last BM:  5/13  Height:   Ht Readings from Last 1 Encounters:  05/28/15 5\' 4"  (1.626 m)    Weight:   Wt Readings from Last 1 Encounters:  05/28/15 113 lb 15.7 oz (51.7 kg)    Ideal Body Weight:  54.54 kg  BMI:  Body mass index is 19.55 kg/(m^2).  Estimated Nutritional Needs:   Kcal:  1250-1550  Protein:  50-60 grams  Fluid:  >/= 1.3L  EDUCATION NEEDS:   No education needs identified at this time     Jarome Matin, RD, LDN Inpatient Clinical Dietitian Pager # [phone] After hours/weekend pager # [phone]

## 2015-06-05 ENCOUNTER — Ambulatory Visit: Payer: BLUE CROSS/BLUE SHIELD | Admitting: *Deleted

## 2015-06-07 ENCOUNTER — Encounter (HOSPITAL_COMMUNITY): Payer: Self-pay

## 2015-06-07 ENCOUNTER — Emergency Department (HOSPITAL_COMMUNITY)
Admission: EM | Admit: 2015-06-07 | Discharge: 2015-06-07 | Disposition: A | Discharge status: Home / Self Care | Payer: BLUE CROSS/BLUE SHIELD | Attending: Emergency Medicine | Admitting: Emergency Medicine

## 2015-06-07 DIAGNOSIS — Z79899 Other long term (current) drug therapy: Secondary | ICD-10-CM | POA: Insufficient documentation

## 2015-06-07 DIAGNOSIS — Z794 Long term (current) use of insulin: Secondary | ICD-10-CM | POA: Diagnosis not present

## 2015-06-07 DIAGNOSIS — I10 Essential (primary) hypertension: Secondary | ICD-10-CM | POA: Diagnosis not present

## 2015-06-07 DIAGNOSIS — K3184 Gastroparesis: Secondary | ICD-10-CM | POA: Diagnosis not present

## 2015-06-07 DIAGNOSIS — R739 Hyperglycemia, unspecified: Secondary | ICD-10-CM

## 2015-06-07 DIAGNOSIS — J45909 Unspecified asthma, uncomplicated: Secondary | ICD-10-CM | POA: Insufficient documentation

## 2015-06-07 DIAGNOSIS — R112 Nausea with vomiting, unspecified: Secondary | ICD-10-CM | POA: Diagnosis present

## 2015-06-07 DIAGNOSIS — M1990 Unspecified osteoarthritis, unspecified site: Secondary | ICD-10-CM | POA: Insufficient documentation

## 2015-06-07 DIAGNOSIS — E1165 Type 2 diabetes mellitus with hyperglycemia: Secondary | ICD-10-CM | POA: Insufficient documentation

## 2015-06-07 LAB — CBC WITH DIFFERENTIAL/PLATELET
Basophils Absolute: 0 10*3/uL (ref 0.0–0.1)
Basophils Relative: 0 %
EOS PCT: 0 %
Eosinophils Absolute: 0 10*3/uL (ref 0.0–0.7)
HCT: 44 % (ref 36.0–46.0)
Hemoglobin: 15.8 g/dL — ABNORMAL HIGH (ref 12.0–15.0)
LYMPHS ABS: 1.8 10*3/uL (ref 0.7–4.0)
LYMPHS PCT: 19 %
MCH: 30.8 pg (ref 26.0–34.0)
MCHC: 35.9 g/dL (ref 30.0–36.0)
MCV: 85.8 fL (ref 78.0–100.0)
MONO ABS: 0.5 10*3/uL (ref 0.1–1.0)
MONOS PCT: 5 %
Neutro Abs: 7.1 10*3/uL (ref 1.7–7.7)
Neutrophils Relative %: 76 %
PLATELETS: 334 10*3/uL (ref 150–400)
RBC: 5.13 MIL/uL — AB (ref 3.87–5.11)
RDW: 12.7 % (ref 11.5–15.5)
WBC: 9.4 10*3/uL (ref 4.0–10.5)

## 2015-06-07 LAB — URINE MICROSCOPIC-ADD ON

## 2015-06-07 LAB — COMPREHENSIVE METABOLIC PANEL
ALT: 20 U/L (ref 14–54)
AST: 32 U/L (ref 15–41)
Albumin: 5.3 g/dL — ABNORMAL HIGH (ref 3.5–5.0)
Alkaline Phosphatase: 101 U/L (ref 38–126)
Anion gap: 15 (ref 5–15)
BUN: 14 mg/dL (ref 6–20)
CHLORIDE: 96 mmol/L — AB (ref 101–111)
CO2: 25 mmol/L (ref 22–32)
CREATININE: 0.72 mg/dL (ref 0.44–1.00)
Calcium: 10.3 mg/dL (ref 8.9–10.3)
Glucose, Bld: 377 mg/dL — ABNORMAL HIGH (ref 65–99)
Potassium: 3.8 mmol/L (ref 3.5–5.1)
Sodium: 136 mmol/L (ref 135–145)
Total Bilirubin: 1.7 mg/dL — ABNORMAL HIGH (ref 0.3–1.2)
Total Protein: 9.2 g/dL — ABNORMAL HIGH (ref 6.5–8.1)

## 2015-06-07 LAB — CBG MONITORING, ED: GLUCOSE-CAPILLARY: 361 mg/dL — AB (ref 65–99)

## 2015-06-07 LAB — URINALYSIS, ROUTINE W REFLEX MICROSCOPIC
BILIRUBIN URINE: NEGATIVE
Glucose, UA: >1000 mg/dL — AB
Leukocytes, UA: NEGATIVE
Nitrite: NEGATIVE
PROTEIN: NEGATIVE mg/dL
Specific Gravity, Urine: 1.04 — ABNORMAL HIGH (ref 1.005–1.030)
pH: 6.5 (ref 5.0–8.0)

## 2015-06-07 LAB — POC URINE PREG, ED: PREG TEST UR: NEGATIVE

## 2015-06-07 MED ORDER — HYDROMORPHONE HCL 1 MG/ML IJ SOLN
0.5000 mg | Freq: Once | INTRAMUSCULAR | Status: AC
  Administered 2015-06-07: 0.5 mg via INTRAVENOUS
  Filled 2015-06-07: qty 1

## 2015-06-07 MED ORDER — SODIUM CHLORIDE 0.9 % IV SOLN
Freq: Once | INTRAVENOUS | Status: AC
  Administered 2015-06-07: 07:00:00 via INTRAVENOUS

## 2015-06-07 MED ORDER — SODIUM CHLORIDE 0.9 % IV BOLUS (SEPSIS)
500.0000 mL | Freq: Once | INTRAVENOUS | Status: AC
  Administered 2015-06-07: 500 mL via INTRAVENOUS

## 2015-06-07 MED ORDER — HYDROMORPHONE HCL 1 MG/ML IJ SOLN
1.0000 mg | Freq: Once | INTRAMUSCULAR | Status: AC
  Administered 2015-06-07: 1 mg via INTRAVENOUS
  Filled 2015-06-07: qty 1

## 2015-06-07 MED ORDER — METOCLOPRAMIDE HCL 5 MG/ML IJ SOLN
10.0000 mg | Freq: Once | INTRAMUSCULAR | Status: AC
  Administered 2015-06-07: 10 mg via INTRAVENOUS
  Filled 2015-06-07: qty 2

## 2015-06-07 MED ORDER — ONDANSETRON HCL 4 MG/2ML IJ SOLN
4.0000 mg | Freq: Once | INTRAMUSCULAR | Status: AC
  Administered 2015-06-07: 4 mg via INTRAVENOUS
  Filled 2015-06-07: qty 2

## 2015-06-07 MED ORDER — SODIUM CHLORIDE 0.9 % IV BOLUS (SEPSIS)
1000.0000 mL | Freq: Once | INTRAVENOUS | Status: AC
  Administered 2015-06-07: 1000 mL via INTRAVENOUS

## 2015-06-07 NOTE — ED Notes (Signed)
Pt states she started vomiting yesterday about 4pm, hx of gastroparesis

## 2015-06-07 NOTE — ED Notes (Signed)
Discharge instructions and follow up care reviewed with patient. Patient verbalized understanding. 

## 2015-06-07 NOTE — ED Notes (Signed)
PA at bedside.

## 2015-06-07 NOTE — ED Provider Notes (Signed)
Pt in ED signed out to me at shift change. Pt with hx of DM and gastroparesis, presented to ED with complaint of n/v/abdominal pain. Pt received 1mg  of dilaudid and 10mg  of reglan. Plan to follow up on labs.   6:23 AM Pt's glucose 377, anion gap is 15, bicarb is 15. Pt reassessed, feeling better. Will administer 2nd bag of NS. Will PO challenge. UA pending.   8:21 AM Patient is feeling much better. She is drinking water and diet Coke. She states that her nausea and vomiting has improved. Her pain is improved as well. He urinalysis does show greater than 80 ketones, no signs of infection. She was hydrated with 3 L of normal saline. She has Zofran, Phenergan, Reglan at home. She wants to go home. I will discharge her home, encourage oral fluids at home, take antiemetics, follow-up with primary care doctor. Return precautions discussed  Filed Vitals:   06/07/15 0656 06/07/15 0753 06/07/15 0800 06/07/15 0815  BP: 137/105 132/98 136/97   Pulse: 103 100 103 101  TempSrc:      Resp: 18 16 16    Height:      Weight:      SpO2: 100% 100% 100% 100%     Jeannett Senior, PA-C 06/07/15 0828  Carmin Muskrat, MD 06/07/15 [phone]

## 2015-06-07 NOTE — Discharge Instructions (Signed)
Take your nausea medicines at home. Please drink plenty of fluids to stay hydrated. Make sure to take your diabetes medications. Follow-up with your doctor in 2 days. Return if symptoms are worsening.   Gastroparesis Gastroparesis, also called delayed gastric emptying, is a condition in which food takes longer than normal to empty from the stomach. The condition is usually long-lasting (chronic). CAUSES This condition may be caused by:  An endocrine disorder, such as hypothyroidism or diabetes. Diabetes is the most common cause of this condition.  A nervous system disease, such as Parkinson disease or multiple sclerosis.  Cancer, infection, or surgery of the stomach or vagus nerve.  A connective tissue disorder, such as scleroderma.  Certain medicines. In most cases, the cause is not known. RISK FACTORS This condition is more likely to develop in:  People with certain disorders, including endocrine disorders, eating disorders, amyloidosis, and scleroderma.  People with certain diseases, including Parkinson disease or multiple sclerosis.  People with cancer or infection of the stomach or vagus nerve.  People who have had surgery on the stomach or vagus nerve.  People who take certain medicines.  Women. SYMPTOMS Symptoms of this condition include:  An early feeling of fullness when eating.  Nausea.  Weight loss.  Vomiting.  Heartburn.  Abdominal bloating.  Inconsistent blood glucose levels.  Lack of appetite.  Acid from the stomach coming up into the esophagus (gastroesophageal reflux).  Spasms of the stomach. Symptoms may come and go. DIAGNOSIS This condition is diagnosed with tests, such as:  Tests that check how long it takes food to move through the stomach and intestines. These tests include:  Upper gastrointestinal (GI) series. In this test, X-rays of the intestines are taken after you drink a liquid. The liquid makes the intestines show up better on  the X-rays.  Gastric emptying scintigraphy. In this test, scans are taken after you eat food that contains a small amount of radioactive material.  Wireless capsule GI monitoring system. This test involves swallowing a capsule that records information about movement through the stomach.  Gastric manometry. This test measures electrical and muscular activity in the stomach. It is done with a thin tube that is passed down the throat and into the stomach.  Endoscopy. This test checks for abnormalities in the lining of the stomach. It is done with a long, thin tube that is passed down the throat and into the stomach.  An ultrasound. This test can help rule out gallbladder disease or pancreatitis as a cause of your symptoms. It uses sound waves to take pictures of the inside of your body. TREATMENT There is no cure for gastroparesis. This condition may be managed with:  Treatment of the underlying condition causing the gastroparesis.  Lifestyle changes, including exercise and dietary changes. Dietary changes can include:  Changes in what and when you eat.  Eating smaller meals more often.  Eating low-fat foods.  Eating low-fiber forms of high-fiber foods, such as cooked vegetables instead of raw vegetables.  Having liquid foods in place of solid foods. Liquid foods are easier to digest.  Medicines. These may be given to control nausea and vomiting and to stimulate stomach muscles.  Getting food through a feeding tube. This may be done in severe cases.  A gastric neurostimulator. This is a device that is inserted into the body with surgery. It helps improve stomach emptying and control nausea and vomiting. HOME CARE INSTRUCTIONS  Follow your health care provider's instructions about exercise and diet.  Take medicines only as directed by your health care provider. SEEK MEDICAL CARE IF:  Your symptoms do not improve with treatment.  You have new symptoms. SEEK IMMEDIATE MEDICAL  CARE IF:  You have severe abdominal pain that does not improve with treatment.  You have nausea that does not go away.  You cannot keep fluids down.   This information is not intended to replace advice given to you by your health care provider. Make sure you discuss any questions you have with your health care provider.   Document Released: 01/05/2005 Document Revised: 05/22/2014 Document Reviewed: 01/01/2014 Elsevier Interactive Patient Education [employer].

## 2015-06-07 NOTE — ED Provider Notes (Signed)
CSN: [id number]     Arrival date & time 06/07/15  0436 History   First MD Initiated Contact with Patient 06/07/15 0501     Chief Complaint  Patient presents with  . Emesis     (Consider location/radiation/quality/duration/timing/severity/associated sxs/prior Treatment) HPI Comments: This 25 year old type I diabetic with a history of gastroparesis who states she started vomiting about 4 PM yesterday afternoon.  Despite the use of Zofran, Reglan at home.  She has persistently been vomiting and has developed worsening abdominal pain.  Patient is a 25 y.o. female presenting with vomiting. The history is provided by the patient.  Emesis Severity:  Severe Duration:  1 day Timing:  Intermittent Quality:  Bilious material Progression:  Worsening Chronicity:  Recurrent Recent urination:  Decreased Relieved by:  Nothing Ineffective treatments:  Antiemetics Associated symptoms: abdominal pain   Associated symptoms: no chills, no diarrhea and no fever   Risk factors: diabetes     Past Medical History  Diagnosis Date  . Diabetes mellitus 2007    IDDM.  poorly controlled, multiple admits with DKA  . Asthma   . Pancreatitis, acute 11/26/2014  . Hepatic steatosis 11/26/2014    and hepatomegaly  . Liver mass 11/26/2014  . Hypertension     NOT CURRENTLY ON ANY BP MED  . Heart murmur   . Arthritis   . Gallstones   . Anxiety    Past Surgical History  Procedure Laterality Date  . Wisdom tooth extraction    . Esophagogastroduodenoscopy (egd) with propofol Left 09/20/2014    Procedure: ESOPHAGOGASTRODUODENOSCOPY (EGD) WITH PROPOFOL;  Surgeon: Arta Silence, MD;  Location: [facility] ENDOSCOPY;  Service: Endoscopy;  Laterality: Left;  . Cholecystectomy N/A 02/11/2015    Procedure: LAPAROSCOPIC CHOLECYSTECTOMY WITH INTRAOPERATIVE CHOLANGIOGRAM;  Surgeon: Greer Pickerel, MD;  Location: [facility] ORS;  Service: General;  Laterality: N/A;   Family History  Problem Relation Age of Onset  . Diabetes Mother   .  Hyperlipidemia Mother   . Hypertension Father   . Heart disease Father   . Heart disease Maternal Grandmother   . Heart disease Maternal Grandfather   . Hypertension Paternal Grandmother   . Cancer Paternal Grandfather    Social History  Substance Use Topics  . Smoking status: Never Smoker   . Smokeless tobacco: Never Used  . Alcohol Use: No   OB History    Gravida Para Term Preterm AB TAB SAB Ectopic Multiple Living   2 1 0 1 1 1 0 0 0 1      Review of Systems  Constitutional: Negative for fever and chills.  Respiratory: Negative for shortness of breath.   Cardiovascular: Negative for chest pain.  Gastrointestinal: Positive for nausea, vomiting and abdominal pain. Negative for diarrhea.  Genitourinary: Negative for dysuria and frequency.  All other systems reviewed and are negative.     Allergies  Peanut-containing drug products and Strawberry extract  Home Medications   Prior to Admission medications   Medication Sig Start Date End Date Taking? Authorizing Provider  albuterol (PROVENTIL HFA;VENTOLIN HFA) 108 (90 BASE) MCG/ACT inhaler Inhale 2 puffs into the lungs every 6 (six) hours as needed for shortness of breath.   Yes Historical Provider, MD  albuterol (PROVENTIL) (2.5 MG/3ML) 0.083% nebulizer solution Take 2.5 mg by nebulization every 6 (six) hours as needed for shortness of breath.   Yes Historical Provider, MD  budesonide-formoterol (SYMBICORT) 160-4.5 MCG/ACT inhaler Inhale 2 puffs into the lungs daily.    Yes Historical Provider, MD  insulin aspart (NOVOLOG  FLEXPEN) 100 UNIT/ML FlexPen INJECT 0-20 UNITS INTO THE SKIN AS DIRECTED. SSI - CARB SCALE, 0-20 UNITS Patient taking differently: Inject 0-20 Units into the skin 4 (four) times daily as needed for high blood sugar. Sliding scale 10/19/13  Yes Elayne Snare, MD  Insulin Glargine (LANTUS SOLOSTAR) 100 UNIT/ML Solostar Pen Inject 24 Units into the skin daily at 10 pm. 05/25/15  Yes Domenic Polite, MD  metoCLOPramide  (REGLAN) 10 MG tablet Take 1 tablet (10 mg total) by mouth 3 (three) times daily before meals. Patient taking differently: Take 10 mg by mouth every 6 (six) hours as needed for nausea or vomiting.  05/27/15  Yes Shary Decamp, PA-C  montelukast (SINGULAIR) 10 MG tablet Take 10 mg by mouth daily.   Yes Historical Provider, MD  omeprazole (PRILOSEC) 40 MG capsule Take 1 capsule (40 mg total) by mouth daily. 04/23/15  Yes Orson Eva, MD  ondansetron (ZOFRAN) 4 MG tablet Take 1 tablet (4 mg total) by mouth every 6 (six) hours. Patient taking differently: Take 4 mg by mouth every 8 (eight) hours as needed for nausea or vomiting.  04/23/15  Yes Sutherlin Lions, PA-C  EPINEPHrine (EPIPEN) 0.3 mg/0.3 mL SOAJ injection Inject 0.3 mg into the muscle once. As needed for allergic reaction. Never had to use before    Historical Provider, MD  promethazine (PHENERGAN) 25 MG suppository Place 1 suppository (25 mg total) rectally every 6 (six) hours as needed for nausea or vomiting. 06/08/15   Okey Regal, PA-C   BP 136/97 mmHg  Pulse 101  Temp(Src)   Resp 16  Ht 5\' 4"  (1.626 m)  Wt 52.164 kg  BMI 19.73 kg/m2  SpO2 100%  LMP 05/13/2015 Physical Exam  Constitutional: She appears well-developed and well-nourished. She appears distressed.  HENT:  Head: Normocephalic.  Eyes: Pupils are equal, round, and reactive to light.  Neck: Normal range of motion.  Cardiovascular: Regular rhythm.  Tachycardia present.   Pulmonary/Chest: Effort normal.  Abdominal: Soft. She exhibits no distension. Bowel sounds are increased. There is tenderness. There is no rebound and no guarding.  Musculoskeletal: Normal range of motion.  Neurological: She is alert.  Skin: Skin is warm and dry. There is pallor.  Nursing note and vitals reviewed.   ED Course  Procedures (including critical care time) Labs Review Labs Reviewed  CBC WITH DIFFERENTIAL/PLATELET - Abnormal; Notable for the following:    RBC 5.13 (*)    Hemoglobin  15.8 (*)    All other components within normal limits  COMPREHENSIVE METABOLIC PANEL - Abnormal; Notable for the following:    Chloride 96 (*)    Glucose, Bld 377 (*)    Total Protein 9.2 (*)    Albumin 5.3 (*)    Total Bilirubin 1.7 (*)    All other components within normal limits  URINALYSIS, ROUTINE W REFLEX MICROSCOPIC (NOT AT [facility]) - Abnormal; Notable for the following:    Specific Gravity, Urine 1.040 (*)    Glucose, UA >1000 (*)    Hgb urine dipstick MODERATE (*)    Ketones, ur >80 (*)    All other components within normal limits  URINE MICROSCOPIC-ADD ON - Abnormal; Notable for the following:    Squamous Epithelial / LPF 0-5 (*)    Bacteria, UA FEW (*)    All other components within normal limits  CBG MONITORING, ED - Abnormal; Notable for the following:    Glucose-Capillary 361 (*)    All other components within normal limits  POC  URINE PREG, ED    Imaging Review No results found. I have personally reviewed and evaluated these images and lab results as part of my medical decision-making.   EKG Interpretation None      MDM   Final diagnoses:  Gastroparesis  Non-intractable vomiting with nausea, vomiting of unspecified type  Hyperglycemia         Junius Creamer, NP 06/09/15 [place], MD 06/10/15 [id number]

## 2015-06-08 ENCOUNTER — Emergency Department (HOSPITAL_COMMUNITY)
Admission: EM | Admit: 2015-06-08 | Discharge: 2015-06-08 | Disposition: A | Discharge status: Home / Self Care | Payer: BLUE CROSS/BLUE SHIELD | Attending: Emergency Medicine | Admitting: Emergency Medicine

## 2015-06-08 ENCOUNTER — Encounter (HOSPITAL_COMMUNITY): Payer: Self-pay | Admitting: Emergency Medicine

## 2015-06-08 DIAGNOSIS — Z91010 Allergy to peanuts: Secondary | ICD-10-CM | POA: Diagnosis not present

## 2015-06-08 DIAGNOSIS — J45909 Unspecified asthma, uncomplicated: Secondary | ICD-10-CM | POA: Diagnosis not present

## 2015-06-08 DIAGNOSIS — I10 Essential (primary) hypertension: Secondary | ICD-10-CM | POA: Insufficient documentation

## 2015-06-08 DIAGNOSIS — Z79899 Other long term (current) drug therapy: Secondary | ICD-10-CM | POA: Insufficient documentation

## 2015-06-08 DIAGNOSIS — K3184 Gastroparesis: Secondary | ICD-10-CM

## 2015-06-08 DIAGNOSIS — R112 Nausea with vomiting, unspecified: Secondary | ICD-10-CM | POA: Diagnosis present

## 2015-06-08 DIAGNOSIS — E1043 Type 1 diabetes mellitus with diabetic autonomic (poly)neuropathy: Secondary | ICD-10-CM | POA: Diagnosis not present

## 2015-06-08 LAB — COMPREHENSIVE METABOLIC PANEL
ALT: 21 U/L (ref 14–54)
AST: 44 U/L — ABNORMAL HIGH (ref 15–41)
Albumin: 4.7 g/dL (ref 3.5–5.0)
Alkaline Phosphatase: 87 U/L (ref 38–126)
Anion gap: 15 (ref 5–15)
BUN: 13 mg/dL (ref 6–20)
CO2: 20 mmol/L — ABNORMAL LOW (ref 22–32)
Calcium: 9.9 mg/dL (ref 8.9–10.3)
Chloride: 103 mmol/L (ref 101–111)
Creatinine, Ser: 0.84 mg/dL (ref 0.44–1.00)
GFR calc Af Amer: >60 mL/min (ref >60)
GFR calc non Af Amer: >60 mL/min (ref >60)
Glucose, Bld: 131 mg/dL — ABNORMAL HIGH (ref 65–99)
Potassium: 4.4 mmol/L (ref 3.5–5.1)
Sodium: 138 mmol/L (ref 135–145)
Total Bilirubin: 2.9 mg/dL — ABNORMAL HIGH (ref 0.3–1.2)
Total Protein: 8.2 g/dL — ABNORMAL HIGH (ref 6.5–8.1)

## 2015-06-08 LAB — CBC WITH DIFFERENTIAL/PLATELET
Basophils Absolute: 0 10*3/uL (ref 0.0–0.1)
Basophils Relative: 0 %
Eosinophils Absolute: 0.2 10*3/uL (ref 0.0–0.7)
Eosinophils Relative: 2 %
HCT: 43.8 % (ref 36.0–46.0)
Hemoglobin: 14.9 g/dL (ref 12.0–15.0)
Lymphocytes Relative: 24 %
Lymphs Abs: 2.3 10*3/uL (ref 0.7–4.0)
MCH: 30 pg (ref 26.0–34.0)
MCHC: 34 g/dL (ref 30.0–36.0)
MCV: 88.3 fL (ref 78.0–100.0)
Monocytes Absolute: 0.6 10*3/uL (ref 0.1–1.0)
Monocytes Relative: 6 %
Neutro Abs: 6.4 10*3/uL (ref 1.7–7.7)
Neutrophils Relative %: 68 %
Platelets: 337 10*3/uL (ref 150–400)
RBC: 4.96 MIL/uL (ref 3.87–5.11)
RDW: 13 % (ref 11.5–15.5)
WBC: 9.5 10*3/uL (ref 4.0–10.5)

## 2015-06-08 LAB — LIPASE, BLOOD: Lipase: 12 U/L (ref 11–51)

## 2015-06-08 LAB — CBG MONITORING, ED: Glucose-Capillary: 163 mg/dL — ABNORMAL HIGH (ref 65–99)

## 2015-06-08 MED ORDER — PROMETHAZINE HCL 25 MG RE SUPP: 25.0000 mg | Freq: Four times a day (QID) | RECTAL | Status: DC | PRN

## 2015-06-08 MED ORDER — HALOPERIDOL LACTATE 5 MG/ML IJ SOLN
4.0000 mg | Freq: Once | INTRAMUSCULAR | Status: AC
  Administered 2015-06-08: 4 mg via INTRAVENOUS
  Filled 2015-06-08: qty 1

## 2015-06-08 MED ORDER — ONDANSETRON HCL 4 MG/2ML IJ SOLN
4.0000 mg | Freq: Once | INTRAMUSCULAR | Status: DC
Start: 2015-06-08 — End: 2015-06-08

## 2015-06-08 MED ORDER — METOCLOPRAMIDE HCL 5 MG/ML IJ SOLN
10.0000 mg | Freq: Once | INTRAMUSCULAR | Status: AC
  Administered 2015-06-08: 10 mg via INTRAVENOUS
  Filled 2015-06-08: qty 2

## 2015-06-08 MED ORDER — SODIUM CHLORIDE 0.9 % IV BOLUS (SEPSIS)
1000.0000 mL | Freq: Once | INTRAVENOUS | Status: AC
  Administered 2015-06-08: 1000 mL via INTRAVENOUS

## 2015-06-08 NOTE — ED Notes (Signed)
Pt resting in bed with eyes closed in NAD. Pt reports no improvement of symptoms however pt is no longer dry heaving at this time.

## 2015-06-08 NOTE — Discharge Instructions (Signed)
Please follow-up with your gastroenterologist for reevaluation and further management. Please return the emergency room if any new or worsening signs or symptoms present.

## 2015-06-08 NOTE — ED Provider Notes (Signed)
CSN: [id number]     Arrival date & time 06/08/15  1154 History   First MD Initiated Contact with Patient 06/08/15 1214     Chief Complaint  Patient presents with  . Emesis  . Loss of Consciousness    HPI   25 year old female with a history of gastroparesis presents today for chronic abdominal pain, nausea and vomiting. Patient notes that over the last 1-2 months she's had almost daily abdominal discomfort, described as diffuse. She's had constant vomiting, is unable to quantify the amount. She reports today's presentation is typical of her previous presentations, notes that she is followed by Dr. Paulita Fujita but has not been able to see him in the last several months as she has been in and out of the hospital. Patient notes that he she attempted taking Zofran by mouth this morning, but continued to have vomiting. Patient denies any fever, diarrhea, chest pain or shortness of breath. Patient notes that she was sitting in a wheelchair at home today and passed out for an undetermined amount of time. Mother is at bedside reports this is typical, notes that she continues to have symptoms and recurring episodes of syncope.    Past Medical History  Diagnosis Date  . Diabetes mellitus 2007    IDDM.  poorly controlled, multiple admits with DKA  . Asthma   . Pancreatitis, acute 11/26/2014  . Hepatic steatosis 11/26/2014    and hepatomegaly  . Liver mass 11/26/2014  . Hypertension     NOT CURRENTLY ON ANY BP MED  . Heart murmur   . Arthritis   . Gallstones   . Anxiety    Past Surgical History  Procedure Laterality Date  . Wisdom tooth extraction    . Esophagogastroduodenoscopy (egd) with propofol Left 09/20/2014    Procedure: ESOPHAGOGASTRODUODENOSCOPY (EGD) WITH PROPOFOL;  Surgeon: Arta Silence, MD;  Location[facility] ENDOSCOPY;  Service: Endoscopy;  Laterality: Left;  . Cholecystectomy N/A 02/11/2015    Procedure: LAPAROSCOPIC CHOLECYSTECTOMY WITH INTRAOPERATIVE CHOLANGIOGRAM;  Surgeon: Greer Pickerel, MD;   Location: [facility] ORS;  Service: General;  Laterality: N/A;   Family History  Problem Relation Age of Onset  . Diabetes Mother   . Hyperlipidemia Mother   . Hypertension Father   . Heart disease Father   . Heart disease Maternal Grandmother   . Heart disease Maternal Grandfather   . Hypertension Paternal Grandmother   . Cancer Paternal Grandfather    Social History  Substance Use Topics  . Smoking status: Never Smoker   . Smokeless tobacco: Never Used  . Alcohol Use: No   OB History    Gravida Para Term Preterm AB TAB SAB Ectopic Multiple Living   2 1 0 1 1 1 0 0 0 1      Review of Systems  All other systems reviewed and are negative.   Allergies  Peanut-containing drug products and Strawberry extract  Home Medications   Prior to Admission medications   Medication Sig Start Date End Date Taking? Authorizing Provider  albuterol (PROVENTIL HFA;VENTOLIN HFA) 108 (90 BASE) MCG/ACT inhaler Inhale 2 puffs into the lungs every 6 (six) hours as needed for shortness of breath.   Yes Historical Provider, MD  albuterol (PROVENTIL) (2.5 MG/3ML) 0.083% nebulizer solution Take 2.5 mg by nebulization every 6 (six) hours as needed for shortness of breath.   Yes Historical Provider, MD  budesonide-formoterol (SYMBICORT) 160-4.5 MCG/ACT inhaler Inhale 2 puffs into the lungs daily.    Yes Historical Provider, MD  EPINEPHrine (EPIPEN) 0.3 mg/0.3  mL SOAJ injection Inject 0.3 mg into the muscle once. As needed for allergic reaction. Never had to use before   Yes Historical Provider, MD  insulin aspart (NOVOLOG FLEXPEN) 100 UNIT/ML FlexPen INJECT 0-20 UNITS INTO THE SKIN AS DIRECTED. SSI - CARB SCALE, 0-20 UNITS Patient taking differently: Inject 0-20 Units into the skin 4 (four) times daily as needed for high blood sugar. Sliding scale 10/19/13  Yes Elayne Snare, MD  Insulin Glargine (LANTUS SOLOSTAR) 100 UNIT/ML Solostar Pen Inject 24 Units into the skin daily at 10 pm. 05/25/15  Yes Domenic Polite, MD   metoCLOPramide (REGLAN) 10 MG tablet Take 1 tablet (10 mg total) by mouth 3 (three) times daily before meals. Patient taking differently: Take 10 mg by mouth every 6 (six) hours as needed for nausea or vomiting.  05/27/15  Yes Shary Decamp, PA-C  montelukast (SINGULAIR) 10 MG tablet Take 10 mg by mouth daily.   Yes Historical Provider, MD  omeprazole (PRILOSEC) 40 MG capsule Take 1 capsule (40 mg total) by mouth daily. 04/23/15  Yes Orson Eva, MD  ondansetron (ZOFRAN) 4 MG tablet Take 1 tablet (4 mg total) by mouth every 6 (six) hours. Patient taking differently: Take 4 mg by mouth every 8 (eight) hours as needed for nausea or vomiting.  04/23/15  Yes  Lions, PA-C  promethazine (PHENERGAN) 25 MG suppository Place 1 suppository (25 mg total) rectally every 6 (six) hours as needed for nausea or vomiting. 06/08/15   Okey Regal, PA-C    BP 131/92 mmHg  Pulse 109  Resp 15  SpO2 100%  LMP 05/13/2015    Physical Exam  Constitutional: She is oriented to person, place, and time. She appears well-developed and well-nourished.  HENT:  Head: Normocephalic and atraumatic.  Eyes: Conjunctivae are normal. Pupils are equal, round, and reactive to light. Right eye exhibits no discharge. Left eye exhibits no discharge. No scleral icterus.  Neck: Normal range of motion. No JVD present. No tracheal deviation present.  Pulmonary/Chest: Effort normal. No stridor.  Abdominal: Soft. She exhibits no distension and no mass. There is tenderness. There is guarding. There is no rebound.  Neurological: She is alert and oriented to person, place, and time. Coordination normal.  Skin: Skin is warm and dry. No rash noted. No pallor.  Psychiatric: She has a normal mood and affect. Her behavior is normal. Judgment and thought content normal.  Nursing note and vitals reviewed.   ED Course  Procedures (including critical care time) Labs Review Labs Reviewed  COMPREHENSIVE METABOLIC PANEL - Abnormal; Notable  for the following:    CO2 20 (*)    Glucose, Bld 131 (*)    Total Protein 8.2 (*)    AST 44 (*)    Total Bilirubin 2.9 (*)    All other components within normal limits  CBG MONITORING, ED - Abnormal; Notable for the following:    Glucose-Capillary 163 (*)    All other components within normal limits  CBC WITH DIFFERENTIAL/PLATELET  LIPASE, BLOOD    Imaging Review No results found. I have personally reviewed and evaluated these images and lab results as part of my medical decision-making.   EKG Interpretation None      MDM   Final diagnoses:  Gastroparesis    Labs: CBC. CMP, Lipase, CBG- no significant findings  Imaging:   Consults:  Therapeutics: Normal saline, Haldol, Reglan   Discharge Meds:   Assessment/Plan:  Patient presents with typical gastroparesis episode. Patient was given Haldol, Reglan,  normal saline. Recheck of patient notes that she is sleeping comfortably in exam bed with no nausea, no signs of distress. Her labs are reassuring with no significant abnormalities.  Patient is awake, no acute distress, nausea gone, very minimal abdominal discomfort. Patient requesting something to drink, she was given several drinks and tolerated without any vomiting. Patient has no abnormalities on her laboratory analysis do not indicate need for hospital admission. Patient will be discharged home with suppository promethazine, encouraged follow-up with both her primary care and gastroenterologist. Strict return precautions given. Both patient and her family verbalized understanding and agreement today's plan had no further questions or concerns at time of discharge       Okey Regal, PA-C 06/08/15 1535  Harvel Quale, MD 06/16/15 [phone]

## 2015-06-08 NOTE — ED Notes (Signed)
Pt here yesterday for gastroparesis. Came here yesterday for same. Pt had syncopal episode in triage. HR 150. CBG in 300s at home.

## 2015-06-09 ENCOUNTER — Encounter (HOSPITAL_COMMUNITY): Payer: Self-pay | Admitting: Emergency Medicine

## 2015-06-09 ENCOUNTER — Emergency Department (HOSPITAL_COMMUNITY): Payer: BLUE CROSS/BLUE SHIELD

## 2015-06-09 ENCOUNTER — Observation Stay (HOSPITAL_COMMUNITY)
Admission: EM | Admit: 2015-06-09 | Discharge: 2015-06-11 | Disposition: A | Discharge status: Home / Self Care | Payer: BLUE CROSS/BLUE SHIELD | Attending: Internal Medicine | Admitting: Internal Medicine

## 2015-06-09 DIAGNOSIS — E1065 Type 1 diabetes mellitus with hyperglycemia: Secondary | ICD-10-CM | POA: Insufficient documentation

## 2015-06-09 DIAGNOSIS — E1010 Type 1 diabetes mellitus with ketoacidosis without coma: Secondary | ICD-10-CM

## 2015-06-09 DIAGNOSIS — J45909 Unspecified asthma, uncomplicated: Secondary | ICD-10-CM | POA: Diagnosis not present

## 2015-06-09 DIAGNOSIS — R000 Tachycardia, unspecified: Secondary | ICD-10-CM | POA: Diagnosis not present

## 2015-06-09 DIAGNOSIS — K3184 Gastroparesis: Secondary | ICD-10-CM

## 2015-06-09 DIAGNOSIS — R1110 Vomiting, unspecified: Secondary | ICD-10-CM

## 2015-06-09 DIAGNOSIS — K219 Gastro-esophageal reflux disease without esophagitis: Secondary | ICD-10-CM | POA: Diagnosis not present

## 2015-06-09 DIAGNOSIS — Z9049 Acquired absence of other specified parts of digestive tract: Secondary | ICD-10-CM | POA: Diagnosis not present

## 2015-06-09 DIAGNOSIS — I10 Essential (primary) hypertension: Secondary | ICD-10-CM | POA: Diagnosis present

## 2015-06-09 DIAGNOSIS — IMO0002 Reserved for concepts with insufficient information to code with codable children: Secondary | ICD-10-CM | POA: Diagnosis present

## 2015-06-09 DIAGNOSIS — E1043 Type 1 diabetes mellitus with diabetic autonomic (poly)neuropathy: Secondary | ICD-10-CM | POA: Insufficient documentation

## 2015-06-09 DIAGNOSIS — E869 Volume depletion, unspecified: Secondary | ICD-10-CM | POA: Diagnosis not present

## 2015-06-09 DIAGNOSIS — E43 Unspecified severe protein-calorie malnutrition: Secondary | ICD-10-CM | POA: Diagnosis not present

## 2015-06-09 DIAGNOSIS — K920 Hematemesis: Secondary | ICD-10-CM

## 2015-06-09 DIAGNOSIS — Z79899 Other long term (current) drug therapy: Secondary | ICD-10-CM | POA: Diagnosis not present

## 2015-06-09 DIAGNOSIS — E861 Hypovolemia: Secondary | ICD-10-CM | POA: Diagnosis present

## 2015-06-09 DIAGNOSIS — Z794 Long term (current) use of insulin: Secondary | ICD-10-CM | POA: Diagnosis not present

## 2015-06-09 DIAGNOSIS — R112 Nausea with vomiting, unspecified: Principal | ICD-10-CM | POA: Diagnosis present

## 2015-06-09 DIAGNOSIS — R1084 Generalized abdominal pain: Secondary | ICD-10-CM

## 2015-06-09 DIAGNOSIS — E1143 Type 2 diabetes mellitus with diabetic autonomic (poly)neuropathy: Secondary | ICD-10-CM | POA: Diagnosis present

## 2015-06-09 DIAGNOSIS — F1290 Cannabis use, unspecified, uncomplicated: Secondary | ICD-10-CM | POA: Insufficient documentation

## 2015-06-09 DIAGNOSIS — E860 Dehydration: Secondary | ICD-10-CM | POA: Diagnosis not present

## 2015-06-09 DIAGNOSIS — E10649 Type 1 diabetes mellitus with hypoglycemia without coma: Secondary | ICD-10-CM | POA: Diagnosis not present

## 2015-06-09 LAB — CBC WITH DIFFERENTIAL/PLATELET
Basophils Absolute: 0 10*3/uL (ref 0.0–0.1)
Basophils Relative: 0 %
EOS PCT: 2 %
Eosinophils Absolute: 0.1 10*3/uL (ref 0.0–0.7)
HCT: 38.6 % (ref 36.0–46.0)
Hemoglobin: 13.8 g/dL (ref 12.0–15.0)
LYMPHS ABS: 1.3 10*3/uL (ref 0.7–4.0)
LYMPHS PCT: 21 %
MCH: 30.5 pg (ref 26.0–34.0)
MCHC: 35.8 g/dL (ref 30.0–36.0)
MCV: 85.2 fL (ref 78.0–100.0)
MONO ABS: 0.4 10*3/uL (ref 0.1–1.0)
Monocytes Relative: 7 %
Neutro Abs: 4 10*3/uL (ref 1.7–7.7)
Neutrophils Relative %: 70 %
PLATELETS: 278 10*3/uL (ref 150–400)
RBC: 4.53 MIL/uL (ref 3.87–5.11)
RDW: 12.9 % (ref 11.5–15.5)
WBC: 5.8 10*3/uL (ref 4.0–10.5)

## 2015-06-09 LAB — COMPREHENSIVE METABOLIC PANEL
ALT: 12 U/L — AB (ref 14–54)
AST: 22 U/L (ref 15–41)
Albumin: 4.1 g/dL (ref 3.5–5.0)
Alkaline Phosphatase: 74 U/L (ref 38–126)
Anion gap: 12 (ref 5–15)
BUN: 12 mg/dL (ref 6–20)
CHLORIDE: 107 mmol/L (ref 101–111)
CO2: 19 mmol/L — ABNORMAL LOW (ref 22–32)
CREATININE: 0.66 mg/dL (ref 0.44–1.00)
Calcium: 8.8 mg/dL — ABNORMAL LOW (ref 8.9–10.3)
Glucose, Bld: 223 mg/dL — ABNORMAL HIGH (ref 65–99)
Potassium: 3.7 mmol/L (ref 3.5–5.1)
Sodium: 138 mmol/L (ref 135–145)
TOTAL PROTEIN: 7.4 g/dL (ref 6.5–8.1)
Total Bilirubin: 1.2 mg/dL (ref 0.3–1.2)

## 2015-06-09 LAB — BLOOD GAS, VENOUS
Acid-base deficit: 7.5 mmol/L — ABNORMAL HIGH (ref 0.0–2.0)
BICARBONATE: 17.2 meq/L — AB (ref 20.0–24.0)
O2 Saturation: 77.4 %
PCO2 VEN: 33.9 mmHg — AB (ref 45.0–50.0)
PO2 VEN: 44.9 mmHg (ref 31.0–45.0)
Patient temperature: 98.6
TCO2: 15.8 mmol/L (ref 0–100)
pH, Ven: 7.326 — ABNORMAL HIGH (ref 7.250–7.300)

## 2015-06-09 LAB — I-STAT TROPONIN, ED: Troponin i, poc: 0 ng/mL (ref 0.00–0.08)

## 2015-06-09 LAB — MAGNESIUM: Magnesium: 1.5 mg/dL — ABNORMAL LOW (ref 1.7–2.4)

## 2015-06-09 LAB — LIPASE, BLOOD: LIPASE: 14 U/L (ref 11–51)

## 2015-06-09 LAB — I-STAT BETA HCG BLOOD, ED (MC, WL, AP ONLY)

## 2015-06-09 LAB — CBG MONITORING, ED: Glucose-Capillary: 219 mg/dL — ABNORMAL HIGH (ref 65–99)

## 2015-06-09 LAB — PHOSPHORUS: Phosphorus: 2.7 mg/dL (ref 2.5–4.6)

## 2015-06-09 MED ORDER — MORPHINE SULFATE (PF) 4 MG/ML IV SOLN
4.0000 mg | Freq: Once | INTRAVENOUS | Status: AC
  Administered 2015-06-10: 4 mg via INTRAVENOUS
  Filled 2015-06-09: qty 1

## 2015-06-09 MED ORDER — PANTOPRAZOLE SODIUM 40 MG IV SOLR
40.0000 mg | Freq: Once | INTRAVENOUS | Status: AC
  Administered 2015-06-09: 40 mg via INTRAVENOUS
  Filled 2015-06-09: qty 40

## 2015-06-09 MED ORDER — METOCLOPRAMIDE HCL 5 MG/ML IJ SOLN
10.0000 mg | Freq: Once | INTRAMUSCULAR | Status: AC
  Administered 2015-06-09: 10 mg via INTRAVENOUS
  Filled 2015-06-09: qty 2

## 2015-06-09 MED ORDER — LORAZEPAM 2 MG/ML IJ SOLN
0.5000 mg | Freq: Once | INTRAMUSCULAR | Status: AC
  Administered 2015-06-09: 0.5 mg via INTRAVENOUS
  Filled 2015-06-09: qty 1

## 2015-06-09 MED ORDER — SODIUM CHLORIDE 0.9 % IV BOLUS (SEPSIS)
1000.0000 mL | Freq: Once | INTRAVENOUS | Status: AC
Start: 2015-06-09 — End: 2015-06-09
  Administered 2015-06-09: 1000 mL via INTRAVENOUS

## 2015-06-09 MED ORDER — MORPHINE SULFATE (PF) 4 MG/ML IV SOLN
4.0000 mg | Freq: Once | INTRAVENOUS | Status: AC
  Administered 2015-06-09: 4 mg via INTRAVENOUS
  Filled 2015-06-09: qty 1

## 2015-06-09 MED ORDER — PROCHLORPERAZINE EDISYLATE 5 MG/ML IJ SOLN
10.0000 mg | Freq: Once | INTRAMUSCULAR | Status: AC
  Administered 2015-06-10: 10 mg via INTRAVENOUS
  Filled 2015-06-09: qty 2

## 2015-06-09 NOTE — ED Provider Notes (Signed)
CSN: [id number]     Arrival date & time 06/09/15  2024 History   First MD Initiated Contact with Patient 06/09/15 2103     Chief Complaint  Patient presents with  . Emesis     (Consider location/radiation/quality/duration/timing/severity/associated sxs/prior Treatment) HPI Comments: [name] is a 25 y.o. female with a PMHx of DM1 with multiple admits for DKA, asthma, pancreatitis, hepatic steatosis, HTN, anxiety, and mallory weiss tear, and a PSHx of cholecystectomy in Jan 2017 by Dr. Redmond Pulling, who presents to the ED with complaints of recurrent nausea, vomiting, and generalized abdominal pain that began around 7:30 PM approximately 1 hour prior to arrival. Patient's mother aids in giving history, and states this is a recurrent issue due to her gastroparesis. She has been seen in the ER 3 times in the last 3 days, states that every time she goes home she does fine and then symptoms will recur when she attempts to eat anything other than watermelon and cherries. Patient's mother states that she attempted to eat a small amount of chicken and mac & cheese when symptoms recurred this evening. She reports that she has had 4 episodes of emesis since onset, and states that upon arrival one of the episodes of emesis had bright red blood, but she has since thrown this bag out. Denies any coffee ground appearance. She is tried PO Phenergan without relief. She has not tried any other antiemetics. She describes her abdominal pain is 10/10 constant aching generalized throughout her abdomen, nonradiating, worse with eating, and with no treatments tried prior to arrival. This is similar to prior episodes. Her gastroenterologist is Dr. Paulita Fujita but she hasn't been able to see him in several months due to being in and out of the hospital. Her last admission was 05/28/15.  She denies any fevers, chills, chest pain, shortness breath, diarrhea, constipation, melena, hematochezia, obstipation, dysuria, hematuria, vaginal  bleeding or discharge, numbness, tingling, weakness, recent travel, sick contacts, suspicious food intake, alcohol use, or chronic NSAID use. No other abd surgeries aside from cholecystectomy.  Patient is a 25 y.o. female presenting with vomiting. The history is provided by the patient, medical records and a parent. No language interpreter was used.  Emesis Severity:  Severe Duration:  1 hour Timing:  Constant Number of daily episodes:  4x Quality:  Bright red blood and stomach contents Progression:  Unchanged Chronicity:  Recurrent Relieved by:  Nothing Worsened by:  Nothing tried Ineffective treatments:  Antiemetics Associated symptoms: abdominal pain   Associated symptoms: no arthralgias, no chills, no diarrhea, no fever and no myalgias   Risk factors: diabetes and prior abdominal surgery (cholecystectomy in January 2017)   Risk factors: no alcohol use, no sick contacts, no suspect food intake and no travel to endemic areas     Past Medical History  Diagnosis Date  . Diabetes mellitus 2007    IDDM.  poorly controlled, multiple admits with DKA  . Asthma   . Pancreatitis, acute 11/26/2014  . Hepatic steatosis 11/26/2014    and hepatomegaly  . Liver mass 11/26/2014  . Hypertension     NOT CURRENTLY ON ANY BP MED  . Heart murmur   . Arthritis   . Gallstones   . Anxiety    Past Surgical History  Procedure Laterality Date  . Wisdom tooth extraction    . Esophagogastroduodenoscopy (egd) with propofol Left 09/20/2014    Procedure: ESOPHAGOGASTRODUODENOSCOPY (EGD) WITH PROPOFOL;  Surgeon: Arta Silence, MD;  Location: [facility] ENDOSCOPY;  Service: Endoscopy;  Laterality:  Left;  . Cholecystectomy N/A 02/11/2015    Procedure: LAPAROSCOPIC CHOLECYSTECTOMY WITH INTRAOPERATIVE CHOLANGIOGRAM;  Surgeon: Greer Pickerel, MD;  Location: [facility] ORS;  Service: General;  Laterality: N/A;   Family History  Problem Relation Age of Onset  . Diabetes Mother   . Hyperlipidemia Mother   . Hypertension Father    . Heart disease Father   . Heart disease Maternal Grandmother   . Heart disease Maternal Grandfather   . Hypertension Paternal Grandmother   . Cancer Paternal Grandfather    Social History  Substance Use Topics  . Smoking status: Never Smoker   . Smokeless tobacco: Never Used  . Alcohol Use: No   OB History    Gravida Para Term Preterm AB TAB SAB Ectopic Multiple Living   2 1 0 1 1 1 0 0 0 1      Review of Systems  Constitutional: Negative for fever and chills.  Respiratory: Negative for shortness of breath.   Cardiovascular: Negative for chest pain.  Gastrointestinal: Positive for nausea, vomiting and abdominal pain. Negative for diarrhea, constipation, blood in stool and anal bleeding.       +hematemesis  Genitourinary: Negative for dysuria, hematuria, vaginal bleeding and vaginal discharge.  Musculoskeletal: Negative for myalgias and arthralgias.  Skin: Negative for color change.  Allergic/Immunologic: Positive for immunocompromised state (diabetic).  Neurological: Negative for weakness and numbness.  Psychiatric/Behavioral: Negative for confusion.   10 Systems reviewed and are negative for acute change except as noted in the HPI.    Allergies  Peanut-containing drug products and Strawberry extract  Home Medications   Prior to Admission medications   Medication Sig Start Date End Date Taking? Authorizing Provider  albuterol (PROVENTIL HFA;VENTOLIN HFA) 108 (90 BASE) MCG/ACT inhaler Inhale 2 puffs into the lungs every 6 (six) hours as needed for shortness of breath.    Historical Provider, MD  albuterol (PROVENTIL) (2.5 MG/3ML) 0.083% nebulizer solution Take 2.5 mg by nebulization every 6 (six) hours as needed for shortness of breath.    Historical Provider, MD  budesonide-formoterol (SYMBICORT) 160-4.5 MCG/ACT inhaler Inhale 2 puffs into the lungs daily.     Historical Provider, MD  EPINEPHrine (EPIPEN) 0.3 mg/0.3 mL SOAJ injection Inject 0.3 mg into the muscle once. As  needed for allergic reaction. Never had to use before    Historical Provider, MD  insulin aspart (NOVOLOG FLEXPEN) 100 UNIT/ML FlexPen INJECT 0-20 UNITS INTO THE SKIN AS DIRECTED. SSI - CARB SCALE, 0-20 UNITS Patient taking differently: Inject 0-20 Units into the skin 4 (four) times daily as needed for high blood sugar. Sliding scale 10/19/13   Elayne Snare, MD  Insulin Glargine (LANTUS SOLOSTAR) 100 UNIT/ML Solostar Pen Inject 24 Units into the skin daily at 10 pm. 05/25/15   Domenic Polite, MD  metoCLOPramide (REGLAN) 10 MG tablet Take 1 tablet (10 mg total) by mouth 3 (three) times daily before meals. Patient taking differently: Take 10 mg by mouth every 6 (six) hours as needed for nausea or vomiting.  05/27/15   Shary Decamp, PA-C  montelukast (SINGULAIR) 10 MG tablet Take 10 mg by mouth daily.    Historical Provider, MD  omeprazole (PRILOSEC) 40 MG capsule Take 1 capsule (40 mg total) by mouth daily. 04/23/15   Orson Eva, MD  ondansetron (ZOFRAN) 4 MG tablet Take 1 tablet (4 mg total) by mouth every 6 (six) hours. Patient taking differently: Take 4 mg by mouth every 8 (eight) hours as needed for nausea or vomiting.  04/23/15   Aldona Bar  Wendie Simmer, PA-C  promethazine (PHENERGAN) 25 MG suppository Place 1 suppository (25 mg total) rectally every 6 (six) hours as needed for nausea or vomiting. 06/08/15   Okey Regal, PA-C   BP 148/102 mmHg  Pulse 143  Temp(Src) 99.6 F (37.6 C) (Oral)  Resp 20  SpO2 100%  LMP 05/13/2015 Physical Exam  Constitutional: She is oriented to person, place, and time. She appears well-developed and well-nourished.  Non-toxic appearance. She appears distressed (vomiting).  Low-grade temp 99.6 similar to prior visits, nontoxic, actively vomiting/heaving, tachycardic likely from vomiting  HENT:  Head: Normocephalic and atraumatic.  Mouth/Throat: Oropharynx is clear and moist. Mucous membranes are dry.  Dry mucous membranes  Eyes: Conjunctivae and EOM are normal. Right eye  exhibits no discharge. Left eye exhibits no discharge.  Neck: Normal range of motion. Neck supple.  Cardiovascular: Regular rhythm, normal heart sounds and intact distal pulses.  Tachycardia present.  Exam reveals no gallop and no friction rub.   No murmur heard. Tachycardic in the 120s likely from vomiting/dehydration  Pulmonary/Chest: Effort normal and breath sounds normal. No respiratory distress. She has no decreased breath sounds. She has no wheezes. She has no rhonchi. She has no rales.  Abdominal: Soft. Normal appearance and bowel sounds are normal. She exhibits no distension. There is generalized tenderness. There is no rigidity, no rebound, no guarding, no CVA tenderness, no tenderness at McBurney's point and negative Murphy's sign.  Soft, nondistended, +BS throughout, with generalized abd TTP, no r/g/r, neg murphy's, neg mcburney's, no CVA TTP   Musculoskeletal: Normal range of motion.  Neurological: She is alert and oriented to person, place, and time. She has normal strength. No sensory deficit.  Skin: Skin is warm, dry and intact. No rash noted.  Psychiatric: She has a normal mood and affect.  Nursing note and vitals reviewed.   ED Course  Procedures (including critical care time)  CRITICAL CARE-early DKA, intractable N/V Performed by: Corine Shelter   Total critical care time: 30 minutes  Critical care time was exclusive of separately billable procedures and treating other patients.  Critical care was necessary to treat or prevent imminent or life-threatening deterioration.  Critical care was time spent personally by me on the following activities: development of treatment plan with patient and/or surrogate as well as nursing, discussions with consultants, evaluation of patient's response to treatment, examination of patient, obtaining history from patient or surrogate, ordering and performing treatments and interventions, ordering and review of laboratory  studies, ordering and review of radiographic studies, pulse oximetry and re-evaluation of patient's condition.   Labs Review Labs Reviewed  COMPREHENSIVE METABOLIC PANEL - Abnormal; Notable for the following:    CO2 19 (*)    Glucose, Bld 223 (*)    Calcium 8.8 (*)    ALT 12 (*)    All other components within normal limits  MAGNESIUM - Abnormal; Notable for the following:    Magnesium 1.5 (*)    All other components within normal limits  BLOOD GAS, VENOUS - Abnormal; Notable for the following:    pH, Ven 7.326 (*)    pCO2, Ven 33.9 (*)    Bicarbonate 17.2 (*)    Acid-base deficit 7.5 (*)    All other components within normal limits  CBG MONITORING, ED - Abnormal; Notable for the following:    Glucose-Capillary 219 (*)    All other components within normal limits  CBG MONITORING, ED - Abnormal; Notable for the following:    Glucose-Capillary 169 (*)  All other components within normal limits  CBC WITH DIFFERENTIAL/PLATELET  LIPASE, BLOOD  PHOSPHORUS  URINALYSIS, ROUTINE W REFLEX MICROSCOPIC (NOT AT [facility])  I-STAT BETA HCG BLOOD, ED ([facility], [facility], [facility] ONLY)  I-STAT TROPOININ, ED    Imaging Review Dg Abd Acute W/chest  06/10/2015  CLINICAL DATA:  Acute onset of nausea, vomiting and generalized abdominal pain. Initial encounter. EXAM: DG ABDOMEN ACUTE W/ 1V CHEST COMPARISON:  Chest and abdominal radiographs performed 03/22/2015, and CT of the abdomen and pelvis performed 04/21/2015 FINDINGS: The lungs are well-aerated and clear. There is no evidence of focal opacification, pleural effusion or pneumothorax. The cardiomediastinal silhouette is within normal limits. The visualized bowel gas pattern is unremarkable. Scattered stool and air are seen within the colon; there is no evidence of small bowel dilatation to suggest obstruction. No free intra-abdominal air is identified on the provided decubitus view. Clips are noted within the right upper quadrant, reflecting prior cholecystectomy. No  acute osseous abnormalities are seen; the sacroiliac joints are unremarkable in appearance. IMPRESSION: 1. Unremarkable bowel gas pattern; no free intra-abdominal air seen. 2. No acute cardiopulmonary process seen. Electronically Signed   By: Garald Balding M.D.   On: 06/10/2015 00:00   I have personally reviewed and evaluated these images and lab results as part of my medical decision-making.   EKG Interpretation   Date/Time:  Sunday Jun 09 2015 21:27:29 EDT Ventricular Rate:  126 PR Interval:  121 QRS Duration: 75 QT Interval:  293 QTC Calculation: 424 R Axis:   80 Text Interpretation:  Sinus tachycardia Consider right atrial enlargement  LVH by voltage Nonspecific T abnormalities, diffuse leads Confirmed by  [facility] MD, JULIE ([id number]) on 06/09/2015 9:35:48 PM      MDM   Final diagnoses:  Generalized abdominal pain  Intractable vomiting with nausea, vomiting of unspecified type  Hematemesis with nausea  Type 1 diabetes mellitus with hyperglycemia (HCC)  Tachycardia  Dehydration  Gastroparesis  Diabetic ketoacidosis without coma associated with type 1 diabetes mellitus ([place])    25 y.o. female here with recurrent n/v/abd pain that began this evening at 7:30pm. She has been seen for the last 3 days for similar symptoms, states this is an ongoing recurrent issue. Mother reports that she had one episode of hematemesis upon arrival, but threw it out so it can't be found for guaiac testing. On exam, pt tachycardic in the 120s, dry mucous membranes, diffuse abd tenderness without focal areas of tenderness, nonperitoneal. Actively dry heaving. No kussmaul respirations. CBG in triage 219, has hx of DKA. Will get labs including VBG, Mg, Phos, and trop/EKG; will give morphine, protonix, ativan, reglan, and fluids. Will obtain acute abd series to ensure no perf/obstruction. I suspect that she will be a difficult pt to achieve adequate control of her n/v, and may need to admit pt, but will  reassess after labs/meds done.   11:35 PM Labs still pending aside from CBC w/diff which is WNL and stable H/H. Trop and betaHCG not resulted, but state they're completed. CMP, VBG, lipase, Phos, and Mg still pending. Acute abd series pending. EKG grossly unchanged from prior, no acute ischemic findings. Pt requesting more pain meds and nausea meds, will try another dose of morphine since it helped some initially, and give her compazine next. Once labs return, will see how she does, if still having intractable symptoms then will need admission  12:20 AM  Beta HCG neg. Trop neg. VBG with slightly low pH 7.326 which is borderline level, she  may be in early DKA. CMP with bicarb 19, gluc 223, no anion gap luckily. Lipase WNL. Phos WNL. Mg 1.5, borderline low. Acute abd series unremarkable. Pt still with symptoms after meds given, still tachycardic in the 120s, given the borderline pH/bicarb values, and intractable symptoms, will proceed with admission. Will start glucostabilizer to continue giving fluids and give insulin. Of note, U/A still not obtained, will ensure this is collected.   12:42 AM Dr. Olevia Bowens TRH down to see pt, will admit. He will place admit orders. Please see his notes for further documentation of care. Pt stable at this time  BP 147/98 mmHg  Pulse 120  Temp(Src) 99.6 F (37.6 C) (Oral)  Resp 19  SpO2 100%  LMP 05/13/2015  Meds ordered this encounter  Medications  . sodium chloride 0.9 % bolus 1,000 mL    Sig:   . metoCLOPramide (REGLAN) injection 10 mg    Sig:   . LORazepam (ATIVAN) injection 0.5 mg    Sig:   . pantoprazole (PROTONIX) injection 40 mg    Sig:   . morphine 4 MG/ML injection 4 mg    Sig:   . morphine 4 MG/ML injection 4 mg    Sig:   . prochlorperazine (COMPAZINE) injection 10 mg    Sig:   . dextrose 5 %-0.45 % sodium chloride infusion    Sig:   . insulin regular (NOVOLIN R,HUMULIN R) 250 Units in sodium chloride 0.9 % 250 mL (1 Units/mL) infusion     Sig:     Order Specific Question:  GlucoStabilizer low target:    Answer:  140    Order Specific Question:  GlucoStabilizer high target:    Answer:  180    Order Specific Question:  Soil scientist:    Answer:  0.01      Yosselin Zoeller Camprubi-Soms, PA-C 06/10/15 [id number]  Isla Pence, MD 06/11/15 [phone]

## 2015-06-09 NOTE — ED Notes (Signed)
Pt states that she has been vomiting since 1930 tonight. Hx of gastroparesis. Taking reglan at home. Hasn't started taking phenergan suppository. Alert and oriented.

## 2015-06-10 ENCOUNTER — Encounter (HOSPITAL_COMMUNITY): Payer: Self-pay | Admitting: Internal Medicine

## 2015-06-10 DIAGNOSIS — R1110 Vomiting, unspecified: Secondary | ICD-10-CM | POA: Diagnosis not present

## 2015-06-10 DIAGNOSIS — E869 Volume depletion, unspecified: Secondary | ICD-10-CM | POA: Diagnosis present

## 2015-06-10 DIAGNOSIS — E861 Hypovolemia: Secondary | ICD-10-CM | POA: Diagnosis present

## 2015-06-10 DIAGNOSIS — E43 Unspecified severe protein-calorie malnutrition: Secondary | ICD-10-CM | POA: Insufficient documentation

## 2015-06-10 LAB — URINALYSIS, ROUTINE W REFLEX MICROSCOPIC
Glucose, UA: >1000 mg/dL — AB
Ketones, ur: >80 mg/dL — AB
Nitrite: NEGATIVE
Protein, ur: NEGATIVE mg/dL
SPECIFIC GRAVITY, URINE: 1.031 — AB (ref 1.005–1.030)
pH: 6 (ref 5.0–8.0)

## 2015-06-10 LAB — GLUCOSE, CAPILLARY
GLUCOSE-CAPILLARY: 129 mg/dL — AB (ref 65–99)
GLUCOSE-CAPILLARY: 37 mg/dL — AB (ref 65–99)
GLUCOSE-CAPILLARY: 279 mg/dL — AB (ref 65–99)
GLUCOSE-CAPILLARY: 195 mg/dL — AB (ref 65–99)
Glucose-Capillary: 114 mg/dL — ABNORMAL HIGH (ref 65–99)
Glucose-Capillary: 189 mg/dL — ABNORMAL HIGH (ref 65–99)
Glucose-Capillary: 216 mg/dL — ABNORMAL HIGH (ref 65–99)

## 2015-06-10 LAB — URINE MICROSCOPIC-ADD ON

## 2015-06-10 LAB — CBG MONITORING, ED: GLUCOSE-CAPILLARY: 169 mg/dL — AB (ref 65–99)

## 2015-06-10 MED ORDER — ENOXAPARIN SODIUM 30 MG/0.3ML ~~LOC~~ SOLN
30.0000 mg | SUBCUTANEOUS | Status: DC
  Filled 2015-06-10: qty 0.3

## 2015-06-10 MED ORDER — MONTELUKAST SODIUM 10 MG PO TABS
10.0000 mg | ORAL_TABLET | Freq: Every day | ORAL | Status: DC
  Administered 2015-06-10: 10 mg via ORAL
  Filled 2015-06-10: qty 1

## 2015-06-10 MED ORDER — SODIUM CHLORIDE 0.9% FLUSH
3.0000 mL | Freq: Two times a day (BID) | INTRAVENOUS | Status: DC
  Administered 2015-06-10 – 2015-06-11 (×3): 3 mL via INTRAVENOUS

## 2015-06-10 MED ORDER — MOMETASONE FURO-FORMOTEROL FUM 200-5 MCG/ACT IN AERO
2.0000 | INHALATION_SPRAY | Freq: Two times a day (BID) | RESPIRATORY_TRACT | Status: DC
Start: 2015-06-10 — End: 2015-06-11
  Administered 2015-06-10: 2 via RESPIRATORY_TRACT
  Filled 2015-06-10: qty 8.8

## 2015-06-10 MED ORDER — ONDANSETRON HCL 4 MG/2ML IJ SOLN
4.0000 mg | Freq: Four times a day (QID) | INTRAMUSCULAR | Status: DC | PRN
  Administered 2015-06-10: 4 mg via INTRAVENOUS
  Filled 2015-06-10 (×2): qty 2

## 2015-06-10 MED ORDER — ONDANSETRON HCL 4 MG PO TABS: 4.0000 mg | ORAL_TABLET | Freq: Four times a day (QID) | ORAL | Status: DC | PRN

## 2015-06-10 MED ORDER — SUCRALFATE 1 GM/10ML PO SUSP
1.0000 g | Freq: Three times a day (TID) | ORAL | Status: DC
  Administered 2015-06-10 – 2015-06-11 (×3): 1 g via ORAL
  Filled 2015-06-10 (×3): qty 10

## 2015-06-10 MED ORDER — INSULIN GLARGINE 100 UNIT/ML ~~LOC~~ SOLN
24.0000 [IU] | Freq: Every day | SUBCUTANEOUS | Status: DC
  Administered 2015-06-10: 24 [IU] via SUBCUTANEOUS
  Filled 2015-06-10: qty 0.24

## 2015-06-10 MED ORDER — ALBUTEROL SULFATE (2.5 MG/3ML) 0.083% IN NEBU: 2.5000 mg | INHALATION_SOLUTION | RESPIRATORY_TRACT | Status: DC | PRN

## 2015-06-10 MED ORDER — DEXTROSE-NACL 5-0.45 % IV SOLN: INTRAVENOUS | Status: DC

## 2015-06-10 MED ORDER — LORAZEPAM 2 MG/ML IJ SOLN: 0.5000 mg | INTRAMUSCULAR | Status: DC | PRN

## 2015-06-10 MED ORDER — MORPHINE SULFATE (PF) 4 MG/ML IV SOLN
4.0000 mg | INTRAVENOUS | Status: DC | PRN
  Administered 2015-06-10 (×2): 4 mg via INTRAVENOUS
  Filled 2015-06-10 (×2): qty 1

## 2015-06-10 MED ORDER — PANTOPRAZOLE SODIUM 40 MG IV SOLR: 40.0000 mg | Freq: Two times a day (BID) | INTRAVENOUS | Status: DC

## 2015-06-10 MED ORDER — INSULIN GLARGINE 100 UNIT/ML ~~LOC~~ SOLN
12.0000 [IU] | Freq: Every day | SUBCUTANEOUS | Status: DC
  Administered 2015-06-10: 12 [IU] via SUBCUTANEOUS
  Filled 2015-06-10: qty 0.12

## 2015-06-10 MED ORDER — ENSURE ENLIVE PO LIQD: 237.0000 mL | Freq: Two times a day (BID) | ORAL | Status: DC

## 2015-06-10 MED ORDER — ENOXAPARIN SODIUM 40 MG/0.4ML ~~LOC~~ SOLN
40.0000 mg | SUBCUTANEOUS | Status: DC
  Administered 2015-06-10: 40 mg via SUBCUTANEOUS
  Filled 2015-06-10: qty 0.4

## 2015-06-10 MED ORDER — POTASSIUM CHLORIDE IN NACL 20-0.9 MEQ/L-% IV SOLN
INTRAVENOUS | Status: DC
Start: 2015-06-10 — End: 2015-06-10
  Administered 2015-06-10 (×2): via INTRAVENOUS
  Filled 2015-06-10 (×2): qty 1000

## 2015-06-10 MED ORDER — METOCLOPRAMIDE HCL 5 MG/ML IJ SOLN
5.0000 mg | Freq: Four times a day (QID) | INTRAMUSCULAR | Status: DC
  Administered 2015-06-10 (×3): 5 mg via INTRAVENOUS
  Filled 2015-06-10 (×3): qty 2

## 2015-06-10 MED ORDER — MAGNESIUM SULFATE 4 GM/100ML IV SOLN
4.0000 g | Freq: Once | INTRAVENOUS | Status: AC
  Administered 2015-06-10: 4 g via INTRAVENOUS
  Filled 2015-06-10: qty 100

## 2015-06-10 MED ORDER — METOCLOPRAMIDE HCL 5 MG/ML IJ SOLN
10.0000 mg | Freq: Three times a day (TID) | INTRAMUSCULAR | Status: DC
  Administered 2015-06-10 – 2015-06-11 (×2): 10 mg via INTRAVENOUS
  Filled 2015-06-10 (×2): qty 2

## 2015-06-10 MED ORDER — GI COCKTAIL ~~LOC~~
30.0000 mL | Freq: Three times a day (TID) | ORAL | Status: DC | PRN
  Administered 2015-06-10: 30 mL via ORAL
  Filled 2015-06-10: qty 30

## 2015-06-10 MED ORDER — PANTOPRAZOLE SODIUM 40 MG PO TBEC
40.0000 mg | DELAYED_RELEASE_TABLET | Freq: Every day | ORAL | Status: DC
  Administered 2015-06-10 – 2015-06-11 (×2): 40 mg via ORAL
  Filled 2015-06-10 (×2): qty 1

## 2015-06-10 MED ORDER — SODIUM CHLORIDE 0.9 % IV SOLN
INTRAVENOUS | Status: DC
  Filled 2015-06-10: qty 2.5

## 2015-06-10 MED ORDER — INSULIN ASPART 100 UNIT/ML ~~LOC~~ SOLN
0.0000 [IU] | Freq: Three times a day (TID) | SUBCUTANEOUS | Status: DC
  Administered 2015-06-10: 2 [IU] via SUBCUTANEOUS
  Administered 2015-06-10 – 2015-06-11 (×2): 1 [IU] via SUBCUTANEOUS
  Administered 2015-06-11: 7 [IU] via SUBCUTANEOUS

## 2015-06-10 NOTE — Progress Notes (Signed)
Hypoglycemic Event  CBG:37  Treatment: 15 GM carbohydrate snack  Symptoms: Shaky  Follow-up CBG: Time:*1115 CBG Result:114  Possible Reasons for Event: Inadequate meal intake  Comments/MD notified:Ghimire    Roe Rutherford

## 2015-06-10 NOTE — Progress Notes (Signed)
Initial Nutrition Assessment  DOCUMENTATION CODES:   Severe malnutrition in context of acute illness/injury  INTERVENTION:  Snacks TID.  Discontinue Ensure per pt preference.   NUTRITION DIAGNOSIS:   Inadequate oral intake related to poor appetite, acute illness (Gastroparesis Flair) as evidenced by per patient/family report.  GOAL:   Patient will meet greater than or equal to 90% of their needs  MONITOR:   PO intake, Supplement acceptance, Diet advancement, Weight trends, Labs, Skin, I & O's  REASON FOR ASSESSMENT:   Malnutrition Screening Tool    ASSESSMENT:   Pt with medical history significant of poorly controlled IDDM, diabetic gastroparesis, asthma, not sure, hypertension, anxiety who comes to the emergency department, for the third time in 3 days, due to persistent nausea and vomiting since Thursday evening.   Pt reports eating once per day for about 2 months d/t gastroparesis flair up.  Her meals consisted of foods like chicken, rice, and green beans. She was trying to eat small meals to eliminate s/s. She reports N/V and poor appetite for the past 2 months.   Her UBW is 125 lbs but she currently weighs 102 lbs. She has experienced an 18% weight loss in the past two months per pt report. Per chart, she has experienced a 10% weight loss in the past week. Both are significant for time frame.  Pt refuses nutrition supplements d/t dislike of taste. She would like to receive snacks. Prefers ice cream popsickles, jello, fruit (not strawberries), and crackers. Will order snacks as diet order deems appropriate.  NFPE: Mild-moderate muscle and fat depletion (clavicles, lower extremities), no edema. Labs reviewed; CBGs 37-169. Meds reviewed.  Diet Order:  Diet full liquid Room service appropriate?: Yes; Fluid consistency:: Thin  Skin:  Reviewed, no issues  Last BM:  5/20  Height:   Ht Readings from Last 1 Encounters:  06/10/15 5\' 4"  (1.626 m)    Weight:   Wt  Readings from Last 1 Encounters:  06/10/15 102 lb (46.267 kg)    Ideal Body Weight:  54.5 kg  BMI:  Body mass index is 17.5 kg/(m^2).  Estimated Nutritional Needs:   Kcal:  1350-1550  Protein:  65-75  Fluid:  1.5 L  EDUCATION NEEDS:   No education needs identified at this time  Geoffery Lyons, [place] Dietetic Intern Pager [phone]

## 2015-06-10 NOTE — Progress Notes (Addendum)
Inpatient Diabetes Program Recommendations  AACE/ADA: New Consensus Statement on Inpatient Glycemic Control (2015)  Target Ranges:  Prepandial:   less than 140 mg/dL      Peak postprandial:   less than 180 mg/dL (1-2 hours)      Critically ill patients:  140 - 180 mg/dL   Review of Glycemic Control  Inpatient Diabetes Program Recommendations:    Noted patient with hypoglycemia this am following lantus at 24 units last HS. Pt states she tries to eat something at HS in order to avoid hypoglycemia, but she was unable to eat last night  Talked with patient for long time regarding her diabetes, but primarily her gastroparesis. She states she has had her diabetes fairly well controlled until the gastroparesis started. She states she sometimes has no warning that she will be sick. Thus she has to run her glucose slightly high to avoid hypoglycemia. She does however take her full dose of basal lantus or lowers it slightly if can't eat a snack at bedtime. Pt states she is extremely depressed as can be well understood. She does not have a lack of knowledge regarding any aspect of her diabetes; she does not need education for her type 1 dm. She states that as a teenager she did not control her blood sugars well, but once she was around 18, she has taken care of it and did well during her pregnancy.  Patient may have hyperlgycemia in the am, as 12 units lantus may not cover her metabolic needs throughout the night. Will follow and assess as needed.  Ordered a spiritual care consult to pray with patient.  Thank you Rosita Kea, RN, MSN, CDE  Diabetes Inpatient Program Office: [phone] Pager: [phone] 8:00 am to 5:00 pm

## 2015-06-10 NOTE — H&P (Signed)
History and Physical    [name] [id number] DOB: [birth year] DOA: 06/09/2015  PCP: Vicenta Aly, FNP   Patient coming from: Home.  Chief Complaint: Nausea and vomiting.  HPI: [name] is a 25 y.o. female with medical history significant of poorly controlled IDDM, diabetic gastroparesis, asthma, not sure, hypertension, anxiety who comes to the emergency department, for the third time in 3 days, due to persistent nausea and vomiting since Thursday evening.   She has multiple admissions for similar GI complaints and DKA in the past. The patient frequently complains of hematemesis without a substantial drop in her hemoglobin level, other than was expected after hemoconcentration is treated with IV fluids. On 09/23/2014, EGD showed diffuse gastritis without clear explanation of the patient's hematemesis.  ED Course: Patient was given IV fluids, antiemetics and analgesics. Workup shows an unremarkable three-way abdomen x-ray series, hyperglycemia in the 200s and a venous pH of 7.33.   Review of Systems: As per HPI otherwise 10 point review of systems negative.    Past Medical History  Diagnosis Date  . Diabetes mellitus 2007    IDDM.  poorly controlled, multiple admits with DKA  . Asthma   . Pancreatitis, acute 11/26/2014  . Hepatic steatosis 11/26/2014    and hepatomegaly  . Liver mass 11/26/2014  . Hypertension     NOT CURRENTLY ON ANY BP MED  . Heart murmur   . Arthritis   . Gallstones   . Anxiety     Past Surgical History  Procedure Laterality Date  . Wisdom tooth extraction    . Esophagogastroduodenoscopy (egd) with propofol Left 09/20/2014    Procedure: ESOPHAGOGASTRODUODENOSCOPY (EGD) WITH PROPOFOL;  Surgeon: Arta Silence, MD;  Location: [facility] ENDOSCOPY;  Service: Endoscopy;  Laterality: Left;  . Cholecystectomy N/A 02/11/2015    Procedure: LAPAROSCOPIC CHOLECYSTECTOMY WITH INTRAOPERATIVE CHOLANGIOGRAM;  Surgeon: Greer Pickerel, MD;  Location: [facility] ORS;   Service: General;  Laterality: N/A;     reports that she has never smoked. She has never used smokeless tobacco. She reports that she uses illicit drugs (Marijuana). She reports that she does not drink alcohol.  Allergies  Allergen Reactions  . Peanut-Containing Drug Products Swelling    Swelling of mouth, lips  . Strawberry Extract Swelling    Swelling of mouth, lips    Family History  Problem Relation Age of Onset  . Diabetes Mother   . Hyperlipidemia Mother   . Hypertension Father   . Heart disease Father   . Heart disease Maternal Grandmother   . Heart disease Maternal Grandfather   . Hypertension Paternal Grandmother   . Cancer Paternal Grandfather     Prior to Admission medications   Medication Sig Start Date End Date Taking? Authorizing Provider  albuterol (PROVENTIL HFA;VENTOLIN HFA) 108 (90 BASE) MCG/ACT inhaler Inhale 2 puffs into the lungs every 6 (six) hours as needed for shortness of breath.   Yes Historical Provider, MD  albuterol (PROVENTIL) (2.5 MG/3ML) 0.083% nebulizer solution Take 2.5 mg by nebulization every 6 (six) hours as needed for shortness of breath.   Yes Historical Provider, MD  budesonide-formoterol (SYMBICORT) 160-4.5 MCG/ACT inhaler Inhale 2 puffs into the lungs daily.    Yes Historical Provider, MD  EPINEPHrine (EPIPEN) 0.3 mg/0.3 mL SOAJ injection Inject 0.3 mg into the muscle once. As needed for allergic reaction. Never had to use before   Yes Historical Provider, MD  insulin aspart (NOVOLOG FLEXPEN) 100 UNIT/ML FlexPen INJECT 0-20 UNITS INTO THE SKIN AS DIRECTED. SSI - CARB SCALE,  0-20 UNITS Patient taking differently: Inject 0-20 Units into the skin 4 (four) times daily as needed for high blood sugar. Sliding scale 10/19/13  Yes Elayne Snare, MD  Insulin Glargine (LANTUS SOLOSTAR) 100 UNIT/ML Solostar Pen Inject 24 Units into the skin daily at 10 pm. 05/25/15  Yes Domenic Polite, MD  metoCLOPramide (REGLAN) 10 MG tablet Take 1 tablet (10 mg total) by  mouth 3 (three) times daily before meals. Patient taking differently: Take 10 mg by mouth every 6 (six) hours as needed for nausea or vomiting.  05/27/15  Yes Shary Decamp, PA-C  montelukast (SINGULAIR) 10 MG tablet Take 10 mg by mouth daily.   Yes Historical Provider, MD  omeprazole (PRILOSEC) 40 MG capsule Take 1 capsule (40 mg total) by mouth daily. 04/23/15  Yes Orson Eva, MD  ondansetron (ZOFRAN) 4 MG tablet Take 1 tablet (4 mg total) by mouth every 6 (six) hours. Patient taking differently: Take 4 mg by mouth every 8 (eight) hours as needed for nausea or vomiting.  04/23/15  Yes [place] Lions, PA-C  promethazine (PHENERGAN) 25 MG suppository Place 1 suppository (25 mg total) rectally every 6 (six) hours as needed for nausea or vomiting. Patient not taking: Reported on 06/09/2015 06/08/15   Okey Regal, PA-C    Physical Exam: Filed Vitals:   06/09/15 2201 06/09/15 2300 06/09/15 2330 06/10/15 0000  BP: 135/95 152/111 139/100 147/98  Pulse: 119 115 117 120  Temp:      TempSrc:      Resp: 19 16 19 19   SpO2: 99% 100% 100% 100%      Constitutional: Mildly anxious. Filed Vitals:   06/09/15 2201 06/09/15 2300 06/09/15 2330 06/10/15 0000  BP: 135/95 152/111 139/100 147/98  Pulse: 119 115 117 120  Temp:      TempSrc:      Resp: 19 16 19 19   SpO2: 99% 100% 100% 100%   Eyes: PERRL, lids and conjunctivae normal ENMT: Mucous membranes are dry. Posterior pharynx clear of any exudate. Neck: normal, supple, no masses, no thyromegaly Respiratory: clear to auscultation bilaterally, no wheezing, no crackles. Normal respiratory effort. No accessory muscle use.  Cardiovascular: Tachycardic at 120 BP and with regular rhythm, no murmurs / rubs / gallops. No extremity edema. 2+ pedal pulses. No carotid bruits.  Abdomen: Diffuse tenderness without guarding or rebound, no masses palpated. No hepatosplenomegaly. Bowel sounds positive.  Musculoskeletal: no clubbing / cyanosis. No joint deformity  upper and lower extremities. Good ROM, no contractures. Normal muscle tone.  Skin: no rashes, lesions, ulcers. No induration Neurologic: CN 2-12 grossly intact. Sensation intact, DTR normal.                      Strength 5/5 in all 4 extremities.  Psychiatric: Normal judgment and insight. Alert and oriented x 4. Normal mood.    Labs on Admission: I have personally reviewed following labs and imaging studies  CBC:  Recent Labs Lab 06/07/15 0525 06/08/15 1228 06/09/15 2301  WBC 9.4 9.5 5.8  NEUTROABS 7.1 6.4 4.0  HGB 15.8* 14.9 13.8  HCT 44.0 43.8 38.6  MCV 85.8 88.3 85.2  PLT 334 337 0000000   Basic Metabolic Panel:  Recent Labs Lab 06/03/15 0447 06/07/15 0525 06/08/15 1228 06/09/15 2301  NA 141 136 138 138  K 3.3* 3.8 4.4 3.7  CL 110 96* 103 107  CO2 27 25 20* 19*  GLUCOSE 97 377* 131* 223*  BUN <5* 14 13 12   CREATININE  0.52 0.72 0.84 0.66  CALCIUM 8.5* 10.3 9.9 8.8*  MG  --   --   --  1.5*  PHOS  --   --   --  2.7   GFR: Estimated Creatinine Clearance: 88.6 mL/min (by C-G formula based on Cr of 0.66). Liver Function Tests:  Recent Labs Lab 06/07/15 0525 06/08/15 1228 06/09/15 2301  AST 32 44* 22  ALT 20 21 12*  ALKPHOS 101 87 74  BILITOT 1.7* 2.9* 1.2  PROT 9.2* 8.2* 7.4  ALBUMIN 5.3* 4.7 4.1    Recent Labs Lab 06/08/15 1228 06/09/15 2301  LIPASE 12 14   CBG:  Recent Labs Lab 06/03/15 1146 06/07/15 0529 06/08/15 1210 06/09/15 2029 06/10/15 0038  GLUCAP 146* 361* 163* 219* 169*   Urine analysis:    Component Value Date/Time   COLORURINE YELLOW 06/07/2015 [place] 06/07/2015 0737   LABSPEC 1.040* 06/07/2015 0737   PHURINE 6.5 06/07/2015 0737   GLUCOSEU >1000* 06/07/2015 0737   GLUCOSEU >=1000 11/07/2012 1205   HGBUR MODERATE* 06/07/2015 0737   BILIRUBINUR NEGATIVE 06/07/2015 0737   KETONESUR >80* 06/07/2015 0737   PROTEINUR NEGATIVE 06/07/2015 0737   UROBILINOGEN 0.2 11/24/2014 1045   NITRITE NEGATIVE 06/07/2015  0737   LEUKOCYTESUR NEGATIVE 06/07/2015 0737    Radiological Exams on Admission: Dg Abd Acute W/chest  06/10/2015  CLINICAL DATA:  Acute onset of nausea, vomiting and generalized abdominal pain. Initial encounter. EXAM: DG ABDOMEN ACUTE W/ 1V CHEST COMPARISON:  Chest and abdominal radiographs performed 03/22/2015, and CT of the abdomen and pelvis performed 04/21/2015 FINDINGS: The lungs are well-aerated and clear. There is no evidence of focal opacification, pleural effusion or pneumothorax. The cardiomediastinal silhouette is within normal limits. The visualized bowel gas pattern is unremarkable. Scattered stool and air are seen within the colon; there is no evidence of small bowel dilatation to suggest obstruction. No free intra-abdominal air is identified on the provided decubitus view. Clips are noted within the right upper quadrant, reflecting prior cholecystectomy. No acute osseous abnormalities are seen; the sacroiliac joints are unremarkable in appearance. IMPRESSION: 1. Unremarkable bowel gas pattern; no free intra-abdominal air seen. 2. No acute cardiopulmonary process seen. Electronically Signed   By: Garald Balding M.D.   On: 06/10/2015 00:00    EKG: Independently reviewed. Vent. rate 126 BPM PR interval 121 ms QRS duration 75 ms QT/QTc 293/424 ms P-R-T axes 80 80 - Sinus tachycardia Consider right atrial enlargement LVH by voltage Nonspecific T abnormalities, diffuse leads  Assessment/Plan Principal Problem:   Intractable nausea and vomiting   Volume depletion   Diabetic gastroparesis (HCC) Admit to telemetry/observation. Nothing by mouth. Continue IV hydration. Continue electrolyte supplementation. Continue antiemetics and analgesics as needed. Follow-up CBC and CMP in a.m. Consider oral trial of clear liquids if the patient's symptoms improved  Active Problems:   Uncontrolled type 1 diabetes mellitus (HCC) Continue Lantus 24 units SQ p.m. CBG monitoring with regular  insulin sliding scale.    Asthma No symptoms at this time. Continue Symbicort. Bronchodilators as needed.    Hypomagnesemia Magnesium sulfate 4 g IVPB 1    Hypertension Not on antihypertensives at this time. Monitor blood pressure closely.      DVT prophylaxis: SCDs. Code Status: Full code. Family Communication:  Disposition Plan: Admit for rehydration and symptoms management. Consults called:  Admission status: Observation/telemetry.   Reubin Milan MD [facility] Pager [phone].  If 7PM-7AM, please contact night-coverage [email or web address] Password [email or web address]  06/10/2015, 1:02 AM

## 2015-06-10 NOTE — Progress Notes (Signed)
Inpatient Diabetes Program Recommendations  AACE/ADA: New Consensus Statement on Inpatient Glycemic Control (2015)  Target Ranges:  Prepandial:   less than 140 mg/dL      Peak postprandial:   less than 180 mg/dL (1-2 hours)      Critically ill patients:  140 - 180 mg/dL   Review of Glycemic Control  Diabetes history: DM type 1 Outpatient Diabetes medications: Lanrus 24 units daily and 0-20 units novolog (correction/cho coverage) 0-20 units 4 times daily Current orders for Inpatient glycemic control: Lantus 12 units (decreased from home dose of 24 units following am hypoglycemia) And sensitive correction tidwc  Inpatient Diabetes Program Recommendations:    Due to patient's gastroparesis and nausea and vomiting, please consider checking cbg's q 4 hrs, using correction only as ordered sensitive tidwc.  Thank you Rosita Kea, RN, MSN, CDE  Diabetes Inpatient Program Office: [phone] Pager: [phone] 8:00 am to 5:00 pm

## 2015-06-10 NOTE — Progress Notes (Signed)
PROGRESS NOTE        PATIENT DETAILS Name: [name] Age: 25 y.o. Sex: female Date of Birth: 07-Sep-1990 Admit Date: 06/09/2015 Admitting Physician Reubin Milan, MD [id number], FNP  Brief Narrative: Patient is a 25 y.o. female with PMHx of DM-1 with numerous recent admission for recurrent nausea/vomiting secondary to presumed Gastroparesis, re-admitted with recurrent vomiting. Does acknowledge smoking marijuana.  Subjective: No vomiting since yesterday pm. No abd pain.   Assessment/Plan: Principal Problem: Intractable nausea and vomiting: although likely 2/2 gastroparesis, she does acknowledge smoking marijuana as well-hence cyclical vomiting syndrome is also possible. Since already better-start full liquids, continue scheduled reglan and follow clinical course. Have re-emphasized regarding importance of low portion diet and importance from abstaining from marijuana usage in the future. Suspect if clinical improvement continues, she should be able to go home in am.  Active Problems: Uncontrolled type 1 diabetes mellitus with hypoglycemia: decrease lantus to 12 units and continue SSI. Follow CBG's.  Hypomagnesemia:repleted, likely 2/2 GI loss.   Asthma: stable-lungs clear-continue bronchodilators  GERD:continue PPI  Protein-calorie malnutrition, severe:continue supplements  Presumed Hepatic Adenoma's:needs repeat MRI -defer to outpatient setting.  DVT Prophylaxis: Prophylactic Lovenox   Code Status: Full code   Family Communication: Spouse at bedside  Disposition Plan: Remain inpatient-home tomorrow if tolerating diet  Antimicrobial agents: None  Procedures: None  CONSULTS:  None  Time spent: 25 minutes-Greater than 50% of this time was spent in counseling, explanation of diagnosis, planning of further management, and coordination of care.  MEDICATIONS: Anti-infectives    None      Scheduled Meds: . feeding  supplement (ENSURE ENLIVE)  237 mL Oral BID BM  . insulin aspart  0-9 Units Subcutaneous TID WC  . insulin glargine  12 Units Subcutaneous QHS  . metoCLOPramide (REGLAN) injection  10 mg Intravenous TID AC  . [START ON 06/11/2015] pantoprazole (PROTONIX) IV  40 mg Intravenous Q12H  . sodium chloride flush  3 mL Intravenous Q12H   Continuous Infusions:  PRN Meds:.LORazepam, ondansetron **OR** ondansetron (ZOFRAN) IV   PHYSICAL EXAM: Vital signs: Filed Vitals:   06/10/15 0030 06/10/15 0150 06/10/15 0520 06/10/15 1455  BP: 130/87 138/99 105/67 114/76  Pulse: 122 115 100 100  Temp:  98.8 F (37.1 C) 98.5 F (36.9 C) 98.6 F (37 C)  TempSrc:  Oral Oral Oral  Resp: 27 11 14 18   Height:  5\' 4"  (1.626 m)    Weight:  46.267 kg (102 lb) 46.267 kg (102 lb)   SpO2: 95% 100% 97% 100%   Filed Weights   06/10/15 0150 06/10/15 0520  Weight: 46.267 kg (102 lb) 46.267 kg (102 lb)   Body mass index is 17.5 kg/(m^2).   Gen Exam: Awake and alert with clear speech. Not in any distress. Appears cachectic Neck: Supple, No JVD.   Chest: B/L Clear.   CVS: S1 S2 Regular, no murmurs. Abdomen: soft, BS +, non tender, non distended.  Extremities: no edema, lower extremities warm to touch. Neurologic: Non Focal.   Skin: No Rash or lesions  Wounds: N/A.    LABORATORY DATA: CBC:  Recent Labs Lab 06/07/15 0525 06/08/15 1228 06/09/15 2301  WBC 9.4 9.5 5.8  NEUTROABS 7.1 6.4 4.0  HGB 15.8* 14.9 13.8  HCT 44.0 43.8 38.6  MCV 85.8 88.3 85.2  PLT 334 337 0000000    Basic Metabolic Panel:  Recent Labs Lab 06/07/15 0525 06/08/15 1228 06/09/15 2301  NA 136 138 138  K 3.8 4.4 3.7  CL 96* 103 107  CO2 25 20* 19*  GLUCOSE 377* 131* 223*  BUN 14 13 12   CREATININE 0.72 0.84 0.66  CALCIUM 10.3 9.9 8.8*  MG  --   --  1.5*  PHOS  --   --  2.7    GFR: Estimated Creatinine Clearance: 78.6 mL/min (by C-G formula based on Cr of 0.66).  Liver Function Tests:  Recent Labs Lab 06/07/15 0525  06/08/15 1228 06/09/15 2301  AST 32 44* 22  ALT 20 21 12*  ALKPHOS 101 87 74  BILITOT 1.7* 2.9* 1.2  PROT 9.2* 8.2* 7.4  ALBUMIN 5.3* 4.7 4.1    Recent Labs Lab 06/08/15 1228 06/09/15 2301  LIPASE 12 14   No results for input(s): AMMONIA in the last 168 hours.  Coagulation Profile: No results for input(s): INR, PROTIME in the last 168 hours.  Cardiac Enzymes: No results for input(s): CKTOTAL, CKMB, CKMBINDEX, TROPONINI in the last 168 hours.  BNP (last 3 results) No results for input(s): PROBNP in the last 8760 hours.  HbA1C: No results for input(s): HGBA1C in the last 72 hours.  CBG:  Recent Labs Lab 06/09/15 2029 06/10/15 0038 06/10/15 0746 06/10/15 1048 06/10/15 1117  GLUCAP 219* 169* 129* 37* 114*    Lipid Profile: No results for input(s): CHOL, HDL, LDLCALC, TRIG, CHOLHDL, LDLDIRECT in the last 72 hours.  Thyroid Function Tests: No results for input(s): TSH, T4TOTAL, FREET4, T3FREE, THYROIDAB in the last 72 hours.  Anemia Panel: No results for input(s): VITAMINB12, FOLATE, FERRITIN, TIBC, IRON, RETICCTPCT in the last 72 hours.  Urine analysis:    Component Value Date/Time   COLORURINE YELLOW 06/10/2015 0430   APPEARANCEUR CLOUDY* 06/10/2015 0430   LABSPEC 1.031* 06/10/2015 0430   PHURINE 6.0 06/10/2015 0430   GLUCOSEU >1000* 06/10/2015 0430   GLUCOSEU >=1000 11/07/2012 1205   HGBUR NEGATIVE 06/10/2015 0430   BILIRUBINUR SMALL* 06/10/2015 0430   KETONESUR >80* 06/10/2015 0430   PROTEINUR NEGATIVE 06/10/2015 0430   UROBILINOGEN 0.2 11/24/2014 1045   NITRITE NEGATIVE 06/10/2015 0430   LEUKOCYTESUR TRACE* 06/10/2015 0430    Sepsis Labs: Lactic Acid, Venous    Component Value Date/Time   LATICACIDVEN 2.3* 11/29/2014 0510    MICROBIOLOGY: No results found for this or any previous visit (from the past 240 hour(s)).  RADIOLOGY STUDIES/RESULTS: Dg Abd Acute W/chest  06/10/2015  CLINICAL DATA:  Acute onset of nausea, vomiting and  generalized abdominal pain. Initial encounter. EXAM: DG ABDOMEN ACUTE W/ 1V CHEST COMPARISON:  Chest and abdominal radiographs performed 03/22/2015, and CT of the abdomen and pelvis performed 04/21/2015 FINDINGS: The lungs are well-aerated and clear. There is no evidence of focal opacification, pleural effusion or pneumothorax. The cardiomediastinal silhouette is within normal limits. The visualized bowel gas pattern is unremarkable. Scattered stool and air are seen within the colon; there is no evidence of small bowel dilatation to suggest obstruction. No free intra-abdominal air is identified on the provided decubitus view. Clips are noted within the right upper quadrant, reflecting prior cholecystectomy. No acute osseous abnormalities are seen; the sacroiliac joints are unremarkable in appearance. IMPRESSION: 1. Unremarkable bowel gas pattern; no free intra-abdominal air seen. 2. No acute cardiopulmonary process seen. Electronically Signed   By: Garald Balding M.D.   On: 06/10/2015 00:00       Oren Binet, MD  [facility] Pager:[phone]  If 7PM-7AM, please  contact night-coverage [email or web address] Password [facility] 06/10/2015, 3:08 PM

## 2015-06-11 DIAGNOSIS — E1065 Type 1 diabetes mellitus with hyperglycemia: Secondary | ICD-10-CM | POA: Diagnosis not present

## 2015-06-11 DIAGNOSIS — I10 Essential (primary) hypertension: Secondary | ICD-10-CM

## 2015-06-11 DIAGNOSIS — K3184 Gastroparesis: Secondary | ICD-10-CM

## 2015-06-11 DIAGNOSIS — R1110 Vomiting, unspecified: Secondary | ICD-10-CM | POA: Diagnosis not present

## 2015-06-11 DIAGNOSIS — E1143 Type 2 diabetes mellitus with diabetic autonomic (poly)neuropathy: Secondary | ICD-10-CM | POA: Diagnosis not present

## 2015-06-11 LAB — GLUCOSE, CAPILLARY
GLUCOSE-CAPILLARY: 98 mg/dL (ref 65–99)
Glucose-Capillary: 126 mg/dL — ABNORMAL HIGH (ref 65–99)
Glucose-Capillary: 317 mg/dL — ABNORMAL HIGH (ref 65–99)

## 2015-06-11 MED ORDER — ENSURE ENLIVE PO LIQD: 237.0000 mL | Freq: Two times a day (BID) | ORAL | Status: DC

## 2015-06-11 MED ORDER — METOCLOPRAMIDE HCL 10 MG PO TABS: 10.0000 mg | ORAL_TABLET | Freq: Three times a day (TID) | ORAL | Status: DC

## 2015-06-11 MED ORDER — METOCLOPRAMIDE HCL 10 MG PO TABS
5.0000 mg | ORAL_TABLET | Freq: Once | ORAL | Status: AC
  Administered 2015-06-11: 5 mg via ORAL
  Filled 2015-06-11: qty 1

## 2015-06-11 NOTE — Discharge Summary (Signed)
PATIENT DETAILS Name: [name] Age: 25 y.o. Sex: female Date of Birth: 06-05-1990 MRN: [id number]. Admitting Physician: Reubin Milan, MD [id number], FNP  Admit Date: 06/09/2015 Discharge date: 06/11/2015  Recommendations for Outpatient Follow-up:  1. Ensure follow-up with gastroenterology 2. Minimize or avoid narcotics as much as possible 3. Needs to avoid marijuana use  4. Please repeat CBC/BMET at next visit 5. Repeat MRI abdomen to reassess presumed hepatic adenomas   PRIMARY DISCHARGE DIAGNOSIS:  Principal Problem:   Intractable nausea and vomiting Active Problems:   Uncontrolled type 1 diabetes mellitus (HCC)   Asthma   Hypomagnesemia   Diabetic gastroparesis (HCC)   Hypertension   Volume depletion   Protein-calorie malnutrition, severe      PAST MEDICAL HISTORY: Past Medical History  Diagnosis Date  . Diabetes mellitus 2007    IDDM.  poorly controlled, multiple admits with DKA  . Asthma   . Pancreatitis, acute 11/26/2014  . Hepatic steatosis 11/26/2014    and hepatomegaly  . Liver mass 11/26/2014  . Hypertension     NOT CURRENTLY ON ANY BP MED  . Heart murmur   . Arthritis   . Gallstones   . Anxiety     DISCHARGE MEDICATIONS: Current Discharge Medication List    START taking these medications   Details  feeding supplement, ENSURE ENLIVE, (ENSURE ENLIVE) LIQD Take 237 mLs by mouth 2 (two) times daily between meals. Qty: 237 mL, Refills: 12      CONTINUE these medications which have CHANGED   Details  metoCLOPramide (REGLAN) 10 MG tablet Take 1 tablet (10 mg total) by mouth 3 (three) times daily before meals. Qty: 45 tablet, Refills: 0      CONTINUE these medications which have NOT CHANGED   Details  albuterol (PROVENTIL HFA;VENTOLIN HFA) 108 (90 BASE) MCG/ACT inhaler Inhale 2 puffs into the lungs every 6 (six) hours as needed for shortness of breath.    albuterol (PROVENTIL) (2.5 MG/3ML) 0.083% nebulizer solution Take 2.5  mg by nebulization every 6 (six) hours as needed for shortness of breath.    budesonide-formoterol (SYMBICORT) 160-4.5 MCG/ACT inhaler Inhale 2 puffs into the lungs daily.     EPINEPHrine (EPIPEN) 0.3 mg/0.3 mL SOAJ injection Inject 0.3 mg into the muscle once. As needed for allergic reaction. Never had to use before    insulin aspart (NOVOLOG FLEXPEN) 100 UNIT/ML FlexPen INJECT 0-20 UNITS INTO THE SKIN AS DIRECTED. SSI - CARB SCALE, 0-20 UNITS Qty: 5 pen, Refills: 0    Insulin Glargine (LANTUS SOLOSTAR) 100 UNIT/ML Solostar Pen Inject 24 Units into the skin daily at 10 pm. Refills: 0    montelukast (SINGULAIR) 10 MG tablet Take 10 mg by mouth daily.    omeprazole (PRILOSEC) 40 MG capsule Take 1 capsule (40 mg total) by mouth daily. Qty: 30 capsule, Refills: 1    ondansetron (ZOFRAN) 4 MG tablet Take 1 tablet (4 mg total) by mouth every 6 (six) hours. Qty: 12 tablet, Refills: 0      STOP taking these medications     promethazine (PHENERGAN) 25 MG suppository         ALLERGIES:   Allergies  Allergen Reactions  . Peanut-Containing Drug Products Swelling    Swelling of mouth, lips  . Strawberry Extract Swelling    Swelling of mouth, lips    BRIEF HPI:  See H&P, Labs, Consult and Test reports for all details in brief, Patient is a 25 y.o. female with PMHx of DM-1 with numerous recent  admission for recurrent nausea/vomiting secondary to presumed Gastroparesis, re-admitted with recurrent vomiting. Does acknowledge smoking marijuana. CONSULTATIONS:   None  PERTINENT RADIOLOGIC STUDIES: Dg Abd Acute W/chest  06/10/2015  CLINICAL DATA:  Acute onset of nausea, vomiting and generalized abdominal pain. Initial encounter. EXAM: DG ABDOMEN ACUTE W/ 1V CHEST COMPARISON:  Chest and abdominal radiographs performed 03/22/2015, and CT of the abdomen and pelvis performed 04/21/2015 FINDINGS: The lungs are well-aerated and clear. There is no evidence of focal opacification, pleural effusion  or pneumothorax. The cardiomediastinal silhouette is within normal limits. The visualized bowel gas pattern is unremarkable. Scattered stool and air are seen within the colon; there is no evidence of small bowel dilatation to suggest obstruction. No free intra-abdominal air is identified on the provided decubitus view. Clips are noted within the right upper quadrant, reflecting prior cholecystectomy. No acute osseous abnormalities are seen; the sacroiliac joints are unremarkable in appearance. IMPRESSION: 1. Unremarkable bowel gas pattern; no free intra-abdominal air seen. 2. No acute cardiopulmonary process seen. Electronically Signed   By: Garald Balding M.D.   On: 06/10/2015 00:00     PERTINENT LAB RESULTS: CBC:  Recent Labs  06/08/15 1228 06/09/15 2301  WBC 9.5 5.8  HGB 14.9 13.8  HCT 43.8 38.6  PLT 337 278   CMET CMP     Component Value Date/Time   NA 138 06/09/2015 2301   K 3.7 06/09/2015 2301   CL 107 06/09/2015 2301   CO2 19* 06/09/2015 2301   GLUCOSE 223* 06/09/2015 2301   BUN 12 06/09/2015 2301   CREATININE 0.66 06/09/2015 2301   CALCIUM 8.8* 06/09/2015 2301   PROT 7.4 06/09/2015 2301   ALBUMIN 4.1 06/09/2015 2301   AST 22 06/09/2015 2301   ALT 12* 06/09/2015 2301   ALKPHOS 74 06/09/2015 2301   BILITOT 1.2 06/09/2015 2301   GFRNONAA >60 06/09/2015 2301   GFRAA >60 06/09/2015 2301    GFR Estimated Creatinine Clearance: 78.6 mL/min (by C-G formula based on Cr of 0.66).  Recent Labs  06/08/15 1228 06/09/15 2301  LIPASE 12 14   No results for input(s): CKTOTAL, CKMB, CKMBINDEX, TROPONINI in the last 72 hours. Invalid input(s): POCBNP No results for input(s): DDIMER in the last 72 hours. No results for input(s): HGBA1C in the last 72 hours. No results for input(s): CHOL, HDL, LDLCALC, TRIG, CHOLHDL, LDLDIRECT in the last 72 hours. No results for input(s): TSH, T4TOTAL, T3FREE, THYROIDAB in the last 72 hours.  Invalid input(s): FREET3 No results for  input(s): VITAMINB12, FOLATE, FERRITIN, TIBC, IRON, RETICCTPCT in the last 72 hours. Coags: No results for input(s): INR in the last 72 hours.  Invalid input(s): PT Microbiology: No results found for this or any previous visit (from the past 240 hour(s)).   BRIEF HOSPITAL COURSE:  Intractable nausea and vomiting: although likely 2/2 gastroparesis, she does acknowledge smoking marijuana as well-hence cyclical vomiting syndrome is also possible. She was admitted and provided supportive care with scheduled Reglan, diet was slowly advanced. She rapidly improved, by day of discharge she is easily tolerating a regular diet he did she has not had any vomiting since this hospitalization, this a.m. her abdomen is soft and nontender. She was counseled regarding importance of low portion diet and completely abstaining from marijuana usage in the future. She is aware of the importance of avoiding narcotics as much as possible.Since she is tolerating regular diet, and is no longer vomiting, she is stable to be discharged home. I have asked her to follow-up  with her primary care practitioner and primary gastrologist in the next 1-2 weeks.   Active Problems: Uncontrolled type 1 diabetes mellitus with hypoglycemia: One episode of hypoglycemia-when patient was nothing by mouth-insulin regimen was adjusted, since she is now tolerating regular diet and is feeling much better, she will be discharged back on her usual regimen of insulin. She will require further optimization in the outpatient setting. Defer to PCP.[name]  Hypomagnesemia:repleted, likely 2/2 GI loss. Recheck A lites periodically  Asthma: stable-lungs clear-continue bronchodilators  GERD:continue PPI  Protein-calorie malnutrition, severe:continue supplements  Presumed Hepatic Adenoma's:needs repeat MRI -defer to outpatient setting.  TODAY-DAY OF DISCHARGE:  Subjective:   [name] today has no headache,no chest abdominal pain,no new  weakness tingling or numbness, feels much better wants to go home today.   Objective:   Blood pressure 125/93, pulse 93, temperature 98.9 F (37.2 C), temperature source Oral, resp. rate 16, height 5\' 4"  (1.626 m), weight 46.267 kg (102 lb), last menstrual period 05/13/2015, SpO2 100 %.  Intake/Output Summary (Last 24 hours) at 06/11/15 1103 Last data filed at 06/10/15 2000  Gross per 24 hour  Intake 1395.83 ml  Output      0 ml  Net 1395.83 ml   Filed Weights   06/10/15 0150 06/10/15 0520  Weight: 46.267 kg (102 lb) 46.267 kg (102 lb)    Exam Awake Alert, Oriented *3, No new F.N deficits, Normal affect [place].AT,PERRAL Supple Neck,No JVD, No cervical lymphadenopathy appriciated.  Symmetrical Chest wall movement, Good air movement bilaterally, CTAB RRR,No Gallops,Rubs or new Murmurs, No Parasternal Heave +ve B.Sounds, Abd Soft, Non tender, No organomegaly appriciated, No rebound -guarding or rigidity. No Cyanosis, Clubbing or edema, No new Rash or bruise  DISCHARGE CONDITION: Stable  DISPOSITION: Home  DISCHARGE INSTRUCTIONS:    Activity:  As tolerated   Get Medicines reviewed and adjusted: Please take all your medications with you for your next visit with your Primary MD  Please request your Primary MD to go over all hospital tests and procedure/radiological results at the follow up, please ask your Primary MD to get all Hospital records sent to his/her office.  If you experience worsening of your admission symptoms, develop shortness of breath, life threatening emergency, suicidal or homicidal thoughts you must seek medical attention immediately by calling 911 or calling your MD immediately  if symptoms less severe.  You must read complete instructions/literature along with all the possible adverse reactions/side effects for all the Medicines you take and that have been prescribed to you. Take any new Medicines after you have completely understood and accpet all the  possible adverse reactions/side effects.   Do not drive when taking Pain medications.   Do not take more than prescribed Pain, Sleep and Anxiety Medications  Special Instructions: If you have smoked or chewed Tobacco  in the last 2 yrs please stop smoking, stop any regular Alcohol  and or any Recreational drug use.  Wear Seat belts while driving.  Please note  You were cared for by a hospitalist during your hospital stay. Once you are discharged, your primary care physician will handle any further medical issues. Please note that NO REFILLS for any discharge medications will be authorized once you are discharged, as it is imperative that you return to your primary care physician (or establish a relationship with a primary care physician if you do not have one) for your aftercare needs so that they can reassess your need for medications and monitor your lab values.  Diet recommendation: Diabetic Diet Heart Healthy diet Gastroparesis diet  Discharge Instructions    Call MD for:  persistant nausea and vomiting    Complete by:  As directed      Call MD for:  severe uncontrolled pain    Complete by:  As directed      Diet - low sodium heart healthy    Complete by:  As directed      Diet Carb Modified    Complete by:  As directed      Increase activity slowly    Complete by:  As directed            Follow-up Information    Follow up with Vicenta Aly, [place]. Schedule an appointment as soon as possible for a visit in 1 week.   Specialty:  Nurse Practitioner   Why:  Hospital follow up   Contact information:   [place]  [address] [phone]       Follow up with Landry Dyke, MD. Schedule an appointment as soon as possible for a visit in 1 week.   Specialty:  Gastroenterology   Contact information:   [id number] [place] [address] [phone]      Total Time spent on discharge equals 25   minutes.  SignedOren Binet 06/11/2015 11:03 AM

## 2015-06-11 NOTE — Progress Notes (Signed)
Went over all discharge information with patient. Explained importance of going to follow up appointment and taking medications as prescribed.  All questions answered.  Discharge summary and prescriptions given to patient.

## 2015-06-11 NOTE — Progress Notes (Signed)
Doing well-no nausea or vomiting. Denies any abdominal pain. Tolerated regular diet for breakfast. Stable for discharge. Counseled extensively regarding importance of avoiding narcotics and marijuana use. She is aware of the importance of low portion diet. She is to follow-up with the primary gastroenterologist Dr. Paulita Fujita.

## 2015-06-22 ENCOUNTER — Encounter (HOSPITAL_COMMUNITY): Payer: Self-pay | Admitting: *Deleted

## 2015-06-22 ENCOUNTER — Observation Stay (HOSPITAL_COMMUNITY)
Admission: EM | Admit: 2015-06-22 | Discharge: 2015-06-24 | Disposition: A | Discharge status: Home / Self Care | Payer: BLUE CROSS/BLUE SHIELD | Attending: Internal Medicine | Admitting: Internal Medicine

## 2015-06-22 DIAGNOSIS — Z79899 Other long term (current) drug therapy: Secondary | ICD-10-CM | POA: Insufficient documentation

## 2015-06-22 DIAGNOSIS — R4182 Altered mental status, unspecified: Secondary | ICD-10-CM | POA: Diagnosis present

## 2015-06-22 DIAGNOSIS — M1990 Unspecified osteoarthritis, unspecified site: Secondary | ICD-10-CM | POA: Insufficient documentation

## 2015-06-22 DIAGNOSIS — E1065 Type 1 diabetes mellitus with hyperglycemia: Secondary | ICD-10-CM | POA: Diagnosis not present

## 2015-06-22 DIAGNOSIS — J45909 Unspecified asthma, uncomplicated: Secondary | ICD-10-CM | POA: Diagnosis not present

## 2015-06-22 DIAGNOSIS — I10 Essential (primary) hypertension: Secondary | ICD-10-CM | POA: Insufficient documentation

## 2015-06-22 DIAGNOSIS — E43 Unspecified severe protein-calorie malnutrition: Secondary | ICD-10-CM | POA: Insufficient documentation

## 2015-06-22 DIAGNOSIS — Z794 Long term (current) use of insulin: Secondary | ICD-10-CM | POA: Diagnosis not present

## 2015-06-22 DIAGNOSIS — E1010 Type 1 diabetes mellitus with ketoacidosis without coma: Secondary | ICD-10-CM | POA: Diagnosis not present

## 2015-06-22 DIAGNOSIS — Z7951 Long term (current) use of inhaled steroids: Secondary | ICD-10-CM | POA: Insufficient documentation

## 2015-06-22 DIAGNOSIS — K3184 Gastroparesis: Secondary | ICD-10-CM | POA: Diagnosis not present

## 2015-06-22 DIAGNOSIS — R739 Hyperglycemia, unspecified: Secondary | ICD-10-CM

## 2015-06-22 DIAGNOSIS — R1110 Vomiting, unspecified: Secondary | ICD-10-CM | POA: Diagnosis present

## 2015-06-22 DIAGNOSIS — E1043 Type 1 diabetes mellitus with diabetic autonomic (poly)neuropathy: Secondary | ICD-10-CM | POA: Diagnosis not present

## 2015-06-22 DIAGNOSIS — R400 Somnolence: Secondary | ICD-10-CM | POA: Diagnosis not present

## 2015-06-22 LAB — BASIC METABOLIC PANEL WITH GFR
Anion gap: 9 (ref 5–15)
BUN: 18 mg/dL (ref 6–20)
CO2: 23 mmol/L (ref 22–32)
Calcium: 8.2 mg/dL — ABNORMAL LOW (ref 8.9–10.3)
Chloride: 105 mmol/L (ref 101–111)
Creatinine, Ser: 0.49 mg/dL (ref 0.44–1.00)
GFR calc Af Amer: >60 mL/min
GFR calc non Af Amer: >60 mL/min
Glucose, Bld: 299 mg/dL — ABNORMAL HIGH (ref 65–99)
Potassium: 4 mmol/L (ref 3.5–5.1)
Sodium: 137 mmol/L (ref 135–145)

## 2015-06-22 LAB — RAPID URINE DRUG SCREEN, HOSP PERFORMED
Amphetamines: NOT DETECTED
Barbiturates: NOT DETECTED
Benzodiazepines: POSITIVE — AB
Cocaine: NOT DETECTED
Opiates: POSITIVE — AB
Tetrahydrocannabinol: POSITIVE — AB

## 2015-06-22 LAB — URINALYSIS, ROUTINE W REFLEX MICROSCOPIC
BILIRUBIN URINE: NEGATIVE
Glucose, UA: >1000 mg/dL — AB
HGB URINE DIPSTICK: NEGATIVE
Leukocytes, UA: NEGATIVE
Nitrite: NEGATIVE
PROTEIN: NEGATIVE mg/dL
Specific Gravity, Urine: 1.039 — ABNORMAL HIGH (ref 1.005–1.030)
pH: 5.5 (ref 5.0–8.0)

## 2015-06-22 LAB — CBC
HCT: 41.2 % (ref 36.0–46.0)
Hemoglobin: 14.3 g/dL (ref 12.0–15.0)
MCH: 30.4 pg (ref 26.0–34.0)
MCHC: 34.7 g/dL (ref 30.0–36.0)
MCV: 87.5 fL (ref 78.0–100.0)
PLATELETS: 448 10*3/uL — AB (ref 150–400)
RBC: 4.71 MIL/uL (ref 3.87–5.11)
RDW: 13.2 % (ref 11.5–15.5)
WBC: 7.1 10*3/uL (ref 4.0–10.5)

## 2015-06-22 LAB — COMPREHENSIVE METABOLIC PANEL
ALBUMIN: 4.4 g/dL (ref 3.5–5.0)
ALT: 18 U/L (ref 14–54)
AST: 27 U/L (ref 15–41)
Alkaline Phosphatase: 93 U/L (ref 38–126)
Anion gap: 19 — ABNORMAL HIGH (ref 5–15)
BUN: 21 mg/dL — AB (ref 6–20)
CALCIUM: 9.7 mg/dL (ref 8.9–10.3)
CHLORIDE: 97 mmol/L — AB (ref 101–111)
CO2: 19 mmol/L — AB (ref 22–32)
Creatinine, Ser: 0.69 mg/dL (ref 0.44–1.00)
GFR calc Af Amer: >60 mL/min (ref >60)
Glucose, Bld: 445 mg/dL — ABNORMAL HIGH (ref 65–99)
POTASSIUM: 3.4 mmol/L — AB (ref 3.5–5.1)
SODIUM: 135 mmol/L (ref 135–145)
Total Bilirubin: 0.9 mg/dL (ref 0.3–1.2)
Total Protein: 8.5 g/dL — ABNORMAL HIGH (ref 6.5–8.1)

## 2015-06-22 LAB — GLUCOSE, CAPILLARY
GLUCOSE-CAPILLARY: 190 mg/dL — AB (ref 65–99)
Glucose-Capillary: 323 mg/dL — ABNORMAL HIGH (ref 65–99)
Glucose-Capillary: 375 mg/dL — ABNORMAL HIGH (ref 65–99)
Glucose-Capillary: 285 mg/dL — ABNORMAL HIGH (ref 65–99)

## 2015-06-22 LAB — URINE MICROSCOPIC-ADD ON

## 2015-06-22 LAB — BLOOD GAS, VENOUS
ACID-BASE DEFICIT: 8.2 mmol/L — AB (ref 0.0–2.0)
BICARBONATE: 17 meq/L — AB (ref 20.0–24.0)
O2 Saturation: 78.8 %
PATIENT TEMPERATURE: 98.6
PCO2 VEN: 35.6 mmHg — AB (ref 45.0–50.0)
PH VEN: 7.301 — AB (ref 7.250–7.300)
PO2 VEN: 48.8 mmHg — AB (ref 31.0–45.0)
TCO2: 15.8 mmol/L (ref 0–100)

## 2015-06-22 LAB — I-STAT BETA HCG BLOOD, ED (MC, WL, AP ONLY): I-stat hCG, quantitative: <5 m[IU]/mL (ref <5)

## 2015-06-22 LAB — CBG MONITORING, ED
GLUCOSE-CAPILLARY: 433 mg/dL — AB (ref 65–99)
GLUCOSE-CAPILLARY: 176 mg/dL — AB (ref 65–99)

## 2015-06-22 LAB — LIPASE, BLOOD: LIPASE: 17 U/L (ref 11–51)

## 2015-06-22 MED ORDER — DEXTROSE-NACL 5-0.45 % IV SOLN: INTRAVENOUS | Status: DC

## 2015-06-22 MED ORDER — SODIUM CHLORIDE 0.9 % IV BOLUS (SEPSIS): 1000.0000 mL | Freq: Once | INTRAVENOUS | Status: DC

## 2015-06-22 MED ORDER — INSULIN ASPART 100 UNIT/ML ~~LOC~~ SOLN
0.0000 [IU] | Freq: Three times a day (TID) | SUBCUTANEOUS | Status: DC
  Administered 2015-06-22: 15 [IU] via SUBCUTANEOUS
  Administered 2015-06-22: 3 [IU] via SUBCUTANEOUS
  Administered 2015-06-23: 11 [IU] via SUBCUTANEOUS

## 2015-06-22 MED ORDER — HALOPERIDOL LACTATE 5 MG/ML IJ SOLN
5.0000 mg | Freq: Once | INTRAMUSCULAR | Status: AC
  Administered 2015-06-22: 5 mg via INTRAVENOUS
  Filled 2015-06-22: qty 1

## 2015-06-22 MED ORDER — HYDROMORPHONE HCL 1 MG/ML IJ SOLN: 0.5000 mg | Freq: Once | INTRAMUSCULAR | Status: DC

## 2015-06-22 MED ORDER — PROMETHAZINE HCL 25 MG/ML IJ SOLN
25.0000 mg | Freq: Once | INTRAMUSCULAR | Status: AC
  Administered 2015-06-22: 25 mg via INTRAVENOUS
  Filled 2015-06-22: qty 1

## 2015-06-22 MED ORDER — ALBUTEROL SULFATE (2.5 MG/3ML) 0.083% IN NEBU: 3.0000 mL | INHALATION_SOLUTION | Freq: Four times a day (QID) | RESPIRATORY_TRACT | Status: DC | PRN

## 2015-06-22 MED ORDER — ONDANSETRON HCL 4 MG/2ML IJ SOLN: 4.0000 mg | Freq: Four times a day (QID) | INTRAMUSCULAR | Status: DC | PRN

## 2015-06-22 MED ORDER — HYDROMORPHONE HCL 1 MG/ML IJ SOLN
1.0000 mg | Freq: Once | INTRAMUSCULAR | Status: AC
Start: 2015-06-22 — End: 2015-06-22
  Administered 2015-06-22: 1 mg via INTRAVENOUS
  Filled 2015-06-22: qty 1

## 2015-06-22 MED ORDER — MOMETASONE FURO-FORMOTEROL FUM 200-5 MCG/ACT IN AERO
2.0000 | INHALATION_SPRAY | Freq: Two times a day (BID) | RESPIRATORY_TRACT | Status: DC
  Filled 2015-06-22: qty 8.8

## 2015-06-22 MED ORDER — ESCITALOPRAM OXALATE 5 MG PO TABS
5.0000 mg | ORAL_TABLET | Freq: Every day | ORAL | Status: DC
  Administered 2015-06-22 – 2015-06-24 (×3): 5 mg via ORAL
  Filled 2015-06-22 (×4): qty 1

## 2015-06-22 MED ORDER — SODIUM CHLORIDE 0.9 % IV SOLN
INTRAVENOUS | Status: DC
  Filled 2015-06-22: qty 2.5

## 2015-06-22 MED ORDER — SODIUM CHLORIDE 0.9 % IV SOLN: INTRAVENOUS | Status: DC

## 2015-06-22 MED ORDER — POTASSIUM CHLORIDE 10 MEQ/100ML IV SOLN
10.0000 meq | INTRAVENOUS | Status: AC
  Administered 2015-06-22 (×4): 10 meq via INTRAVENOUS
  Filled 2015-06-22 (×4): qty 100

## 2015-06-22 MED ORDER — SODIUM CHLORIDE 0.9 % IV SOLN
INTRAVENOUS | Status: DC
  Administered 2015-06-22 – 2015-06-23 (×4): via INTRAVENOUS

## 2015-06-22 MED ORDER — METOCLOPRAMIDE HCL 5 MG/ML IJ SOLN
10.0000 mg | Freq: Four times a day (QID) | INTRAMUSCULAR | Status: DC
  Administered 2015-06-22 – 2015-06-24 (×8): 10 mg via INTRAVENOUS
  Filled 2015-06-22 (×8): qty 2

## 2015-06-22 MED ORDER — SODIUM CHLORIDE 0.9 % IV BOLUS (SEPSIS)
2000.0000 mL | Freq: Once | INTRAVENOUS | Status: AC
  Administered 2015-06-22: 2000 mL via INTRAVENOUS

## 2015-06-22 MED ORDER — SODIUM CHLORIDE 0.9 % IV SOLN
INTRAVENOUS | Status: DC
  Administered 2015-06-22: 1000 mL via INTRAVENOUS

## 2015-06-22 MED ORDER — MONTELUKAST SODIUM 10 MG PO TABS
10.0000 mg | ORAL_TABLET | Freq: Every day | ORAL | Status: DC
  Administered 2015-06-22 – 2015-06-24 (×3): 10 mg via ORAL
  Filled 2015-06-22 (×3): qty 1

## 2015-06-22 MED ORDER — ALBUTEROL SULFATE (2.5 MG/3ML) 0.083% IN NEBU
2.5000 mg | INHALATION_SOLUTION | Freq: Four times a day (QID) | RESPIRATORY_TRACT | Status: DC | PRN
Start: 2015-06-22 — End: 2015-06-22

## 2015-06-22 MED ORDER — ONDANSETRON HCL 4 MG PO TABS
4.0000 mg | ORAL_TABLET | Freq: Three times a day (TID) | ORAL | Status: DC | PRN
  Administered 2015-06-23: 4 mg via ORAL
  Filled 2015-06-22: qty 1

## 2015-06-22 MED ORDER — PANTOPRAZOLE SODIUM 40 MG PO TBEC
80.0000 mg | DELAYED_RELEASE_TABLET | Freq: Every day | ORAL | Status: DC
  Administered 2015-06-22 – 2015-06-24 (×3): 80 mg via ORAL
  Filled 2015-06-22 (×3): qty 2

## 2015-06-22 MED ORDER — ENOXAPARIN SODIUM 40 MG/0.4ML ~~LOC~~ SOLN
40.0000 mg | SUBCUTANEOUS | Status: DC
  Administered 2015-06-22 – 2015-06-23 (×2): 40 mg via SUBCUTANEOUS
  Filled 2015-06-22 (×3): qty 0.4

## 2015-06-22 MED ORDER — INSULIN GLARGINE 100 UNIT/ML ~~LOC~~ SOLN
10.0000 [IU] | Freq: Every day | SUBCUTANEOUS | Status: DC
  Administered 2015-06-22: 10 [IU] via SUBCUTANEOUS
  Filled 2015-06-22 (×2): qty 0.1

## 2015-06-22 MED ORDER — TRAMADOL HCL 50 MG PO TABS
50.0000 mg | ORAL_TABLET | Freq: Four times a day (QID) | ORAL | Status: DC | PRN
  Administered 2015-06-22 – 2015-06-23 (×3): 50 mg via ORAL
  Filled 2015-06-22 (×3): qty 1

## 2015-06-22 MED ORDER — SODIUM CHLORIDE 0.9 % IV SOLN
INTRAVENOUS | Status: DC
  Filled 2015-06-22 (×2): qty 2.5

## 2015-06-22 MED ORDER — ONDANSETRON HCL 4 MG/2ML IJ SOLN
4.0000 mg | Freq: Once | INTRAMUSCULAR | Status: AC
  Administered 2015-06-22: 4 mg via INTRAVENOUS
  Filled 2015-06-22: qty 2

## 2015-06-22 MED ORDER — LORAZEPAM 2 MG/ML IJ SOLN
2.0000 mg | Freq: Once | INTRAMUSCULAR | Status: AC
  Administered 2015-06-22: 2 mg via INTRAVENOUS
  Filled 2015-06-22: qty 1

## 2015-06-22 NOTE — Progress Notes (Addendum)
Inpatient Diabetes Program Recommendations  AACE/ADA: New Consensus Statement on Inpatient Glycemic Control (2015)  Target Ranges:  Prepandial:   less than 140 mg/dL      Peak postprandial:   less than 180 mg/dL (1-2 hours)      Critically ill patients:  140 - 180 mg/dL   Results for MARYRUTH, APPLE (MRN [id number]) as of 06/22/2015 15:02  Ref. Range 06/22/2015 03:27  Sodium Latest Ref Range: 135-145 mmol/L 135  Potassium Latest Ref Range: 3.5-5.1 mmol/L 3.4 (L)  Chloride Latest Ref Range: 101-111 mmol/L 97 (L)  CO2 Latest Ref Range: 22-32 mmol/L 19 (L)  BUN Latest Ref Range: 6-20 mg/dL 21 (H)  Creatinine Latest Ref Range: 0.44-1.00 mg/dL 0.69  Calcium Latest Ref Range: 8.9-10.3 mg/dL 9.7  EGFR (Non-African Amer.) Latest Ref Range: >60 mL/min >60  EGFR (African American) Latest Ref Range: >60 mL/min >60  Glucose Latest Ref Range: 65-99 mg/dL 445 (H)  Anion gap Latest Ref Range: 5-15  19 (H)    Results for YARIELIZ, WASSER (MRN [id number]) as of 06/22/2015 15:02  Ref. Range 06/22/2015 02:58 06/22/2015 04:36 06/22/2015 08:32 06/22/2015 11:36  Glucose-Capillary Latest Ref Range: 65-99 mg/dL 433 (H) 176 (H) 323 (H) 375 (H)     Admit with: Mild DKA/ Gastroparesis  History: DM Type 1, Gastroparesis, Frequent DKA/Hospitalizations  Home DM Meds: Basglar insulin 24 units QHS       Novolog 1 unit for every 10 grams of Carbohydrates eaten (give after meal is finished)       Novolog 1 unit for every 50 mg/dl above target CBG of 150 mg/dl  Current Insulin Orders: Lantus 10 units daily      Novolog Moderate Correction Scale/ SSI (0-15 units) TID AC    Patient well known to the Inpatient Diabetes Program  MD- Note patient saw her Endocrinologist (Dr. Hartford Poli with [facility]) on 06/14/15.  Patient was instructed to continue her current insulin regimen (see above for details).  She was also instructed to make sure to check her CBGs before all meals and 2 hours after all meals.  MD- Please consider  the following in-hospital insulin adjustments:   1. Increase Lantus to 24 units daily (home dose).  May want to go ahead and give extra 10 units Lantus tonight at bedtime.  2. Reduce Novolog Correction Scale/ SSI to Sensitive scale (0-9 units) TID AC + HS  3. Start Custom Novolog Meal Coverage- 1 unit for every 10 grams of Carbohydrates eaten.  Make sure to give this dose after patient finishes her meal due to her gastroparesis.       --Will follow patient during hospitalization--  Wyn Quaker RN, MSN, CDE Diabetes Coordinator Inpatient Glycemic Control Team Team Pager: [phone] (8a-5p)

## 2015-06-22 NOTE — ED Notes (Signed)
Pt reports onset of vomiting at 0200 today. Hx of gastroparesis.

## 2015-06-22 NOTE — Progress Notes (Signed)
Patient is sedated  sedated from the medicines she had on the emergency room.

## 2015-06-22 NOTE — ED Notes (Signed)
CBG performed and is 433 at this time

## 2015-06-22 NOTE — ED Provider Notes (Addendum)
CSN: [id number]     Arrival date & time 06/22/15  0243 History   By signing my name below, I, Maud Deed. Royston Sinner, attest that this documentation has been prepared under the direction and in the presence of Wandra Arthurs, MD.  Electronically Signed: Maud Deed. Royston Sinner, ED Scribe. 06/22/2015. 3:27 AM.   Chief Complaint  Patient presents with  . Emesis   The history is provided by the patient. No language interpreter was used.    HPI Comments: [name] is a 25 y.o. female with a PMHx of DM, HTN, and gallstones who presents to the Emergency Department complaining of intermittent, unchanged nausea and vomiting onset 2:00 AM this morning. She also reports ongoing chills and abdominal pain. No aggravating or alleviating factors at this time. No OTC medications or home remedies attempted prior to arrival. No recent fever or chills. Pt states blood sugars typically run in the 100s but shot up to 400 prior to arrival. Mother states pt was recently discharged from the hospital after being admitted for similar approximately 3 weeks ago.   PCP: Vicenta Aly, FNP    Past Medical History  Diagnosis Date  . Diabetes mellitus 2007    IDDM.  poorly controlled, multiple admits with DKA  . Asthma   . Pancreatitis, acute 11/26/2014  . Hepatic steatosis 11/26/2014    and hepatomegaly  . Liver mass 11/26/2014  . Hypertension     NOT CURRENTLY ON ANY BP MED  . Heart murmur   . Arthritis   . Gallstones   . Anxiety    Past Surgical History  Procedure Laterality Date  . Wisdom tooth extraction    . Esophagogastroduodenoscopy (egd) with propofol Left 09/20/2014    Procedure: ESOPHAGOGASTRODUODENOSCOPY (EGD) WITH PROPOFOL;  Surgeon: Arta Silence, MD;  Location: [facility] ENDOSCOPY;  Service: Endoscopy;  Laterality: Left;  . Cholecystectomy N/A 02/11/2015    Procedure: LAPAROSCOPIC CHOLECYSTECTOMY WITH INTRAOPERATIVE CHOLANGIOGRAM;  Surgeon: Greer Pickerel, MD;  Location: [facility] ORS;  Service: General;  Laterality: N/A;    Family History  Problem Relation Age of Onset  . Diabetes Mother   . Hyperlipidemia Mother   . Hypertension Father   . Heart disease Father   . Heart disease Maternal Grandmother   . Heart disease Maternal Grandfather   . Hypertension Paternal Grandmother   . Cancer Paternal Grandfather    Social History  Substance Use Topics  . Smoking status: Never Smoker   . Smokeless tobacco: Never Used  . Alcohol Use: No   OB History    Gravida Para Term Preterm AB TAB SAB Ectopic Multiple Living   2 1 0 1 1 1 0 0 0 1      Review of Systems  Constitutional: Negative for fever and chills.  Gastrointestinal: Positive for nausea, vomiting and abdominal pain.  All other systems reviewed and are negative.     Allergies  Peanut-containing drug products and Strawberry extract  Home Medications   Prior to Admission medications   Medication Sig Start Date End Date Taking? Authorizing Provider  albuterol (PROVENTIL HFA;VENTOLIN HFA) 108 (90 BASE) MCG/ACT inhaler Inhale 2 puffs into the lungs every 6 (six) hours as needed for shortness of breath.   Yes Historical Provider, MD  albuterol (PROVENTIL) (2.5 MG/3ML) 0.083% nebulizer solution Take 2.5 mg by nebulization every 6 (six) hours as needed for shortness of breath.   Yes Historical Provider, MD  budesonide-formoterol (SYMBICORT) 160-4.5 MCG/ACT inhaler Inhale 2 puffs into the lungs daily.    Yes Historical Provider,  MD  EPINEPHrine (EPIPEN) 0.3 mg/0.3 mL SOAJ injection Inject 0.3 mg into the muscle once. As needed for allergic reaction. Never had to use before   Yes Historical Provider, MD  escitalopram (LEXAPRO) 10 MG tablet Take 5 mg by mouth daily. 06/20/15 06/19/16 Yes Historical Provider, MD  insulin aspart (NOVOLOG FLEXPEN) 100 UNIT/ML FlexPen INJECT 0-20 UNITS INTO THE SKIN AS DIRECTED. SSI - CARB SCALE, 0-20 UNITS Patient taking differently: Inject 0-20 Units into the skin 4 (four) times daily as needed for high blood sugar. Sliding  scale 10/19/13  Yes Elayne Snare, MD  Insulin Glargine (LANTUS SOLOSTAR) 100 UNIT/ML Solostar Pen Inject 24 Units into the skin daily at 10 pm. 05/25/15  Yes Domenic Polite, MD  metoCLOPramide (REGLAN) 10 MG tablet Take 1 tablet (10 mg total) by mouth 3 (three) times daily before meals. 06/11/15  Yes Shanker Kristeen Mans, MD  montelukast (SINGULAIR) 10 MG tablet Take 10 mg by mouth daily.   Yes Historical Provider, MD  omeprazole (PRILOSEC) 40 MG capsule Take 1 capsule (40 mg total) by mouth daily. 04/23/15  Yes Orson Eva, MD  ondansetron (ZOFRAN) 4 MG tablet Take 1 tablet (4 mg total) by mouth every 6 (six) hours. Patient taking differently: Take 4 mg by mouth every 8 (eight) hours as needed for nausea or vomiting.  04/23/15  Yes Dulac Lions, PA-C  feeding supplement, ENSURE ENLIVE, (ENSURE ENLIVE) LIQD Take 237 mLs by mouth 2 (two) times daily between meals. Patient not taking: Reported on 06/22/2015 06/11/15   Jonetta Osgood, MD   Triage Vitals: BP 96/60 mmHg  Pulse 105  Temp(Src) 98.2 F (36.8 C)  Resp 15  Ht 5\' 2"  (1.575 m)  Wt 102 lb (46.267 kg)  BMI 18.65 kg/m2  SpO2 97%  LMP 05/13/2015   Physical Exam  Constitutional: She is oriented to person, place, and time. She appears well-developed and well-nourished. No distress.  HENT:  Head: Normocephalic and atraumatic.  Dry mucous membranes   Eyes: EOM are normal.  Neck: Normal range of motion.  Cardiovascular: Normal rate, regular rhythm and normal heart sounds.   Pulmonary/Chest: Effort normal and breath sounds normal.  Abdominal: Soft. She exhibits no distension. There is tenderness.  Mild epigastric tenderness   Musculoskeletal: Normal range of motion.  Neurological: She is alert and oriented to person, place, and time.  Skin: Skin is warm and dry.  Psychiatric: She has a normal mood and affect. Judgment normal.  Nursing note and vitals reviewed.   ED Course  Procedures (including critical care time)  DIAGNOSTIC  STUDIES: Oxygen Saturation is 98% on RA, Normal by my interpretation.    COORDINATION OF CARE: 3:25 AM- Will order blood work, urinalysis. Will give Dilaudid, Ativan, Phenergan, Haldol, fluids, and Zofran, Discussed treatment plan with pt at bedside and pt agreed to plan.      CRITICAL CARE Performed by: Darl Householder, Kissa Campoy   Total critical care time: 30 minutes  Critical care time was exclusive of separately billable procedures and treating other patients.  Critical care was necessary to treat or prevent imminent or life-threatening deterioration.  Critical care was time spent personally by me on the following activities: development of treatment plan with patient and/or surrogate as well as nursing, discussions with consultants, evaluation of patient's response to treatment, examination of patient, obtaining history from patient or surrogate, ordering and performing treatments and interventions, ordering and review of laboratory studies, ordering and review of radiographic studies, pulse oximetry and re-evaluation of patient's condition.  Labs Review Labs Reviewed  COMPREHENSIVE METABOLIC PANEL - Abnormal; Notable for the following:    Potassium 3.4 (*)    Chloride 97 (*)    CO2 19 (*)    Glucose, Bld 445 (*)    BUN 21 (*)    Total Protein 8.5 (*)    Anion gap 19 (*)    All other components within normal limits  CBC - Abnormal; Notable for the following:    Platelets 448 (*)    All other components within normal limits  BLOOD GAS, VENOUS - Abnormal; Notable for the following:    pH, Ven 7.301 (*)    pCO2, Ven 35.6 (*)    pO2, Ven 48.8 (*)    Bicarbonate 17.0 (*)    Acid-base deficit 8.2 (*)    All other components within normal limits  CBG MONITORING, ED - Abnormal; Notable for the following:    Glucose-Capillary 433 (*)    All other components within normal limits  CBG MONITORING, ED - Abnormal; Notable for the following:    Glucose-Capillary 176 (*)    All other components  within normal limits  LIPASE, BLOOD  URINALYSIS, ROUTINE W REFLEX MICROSCOPIC (NOT AT [facility])  I-STAT BETA HCG BLOOD, ED ([facility], [facility], [facility] ONLY)    Imaging Review No results found. I have personally reviewed and evaluated these images and lab results as part of my medical decision-making.   EKG Interpretation None      MDM   Final diagnoses:  None    Nikkole Gressley is a 25 y.o. female here with vomiting from gastroparesis. CBG was 433. Consider DKA. Will get labs, vbg, UA. Will give antiemetics and hydrate and reassess.  4:48 AM Glucose 445, AG 19 likely dehydration vs DKA. Given 2 L NS and antiemetics and now more comfortable and not retching anymore. Will start on glucostabilizer. Will admit.    I personally performed the services described in this documentation, which was scribed in my presence. The recorded information has been reviewed and is accurate.   Wandra Arthurs, MD 06/22/15 [id number]  Wandra Arthurs, MD 06/22/15 [phone]

## 2015-06-22 NOTE — H&P (Signed)
History and Physical    [name] [id number] DOB: Mar 07, 1990 DOA: 06/22/2015   PCP: Vicenta Aly, FNP Chief Complaint:  Chief Complaint  Patient presents with  . Emesis    HPI: [name] is a 25 y.o. female with medical history significant of DM1, HTN, recurrent visits and admits for diabetic gastroparesis.  Patient presents to the ED with c/o N/V onset at 2am.  Symptoms unchanged since onset.  She reports not taking her lantus last night.  No meds PTA.  ED Course: Patient was given haldol, phenergan, zofran, ativan, and dilaudid.  Patient will now open eyes and wake up but is very sedated and cannot carry on a conversation.  Given IVF bolus for DKA.  Review of Systems: Unable to perform due to AMS.   Past Medical History  Diagnosis Date  . Diabetes mellitus 2007    IDDM.  poorly controlled, multiple admits with DKA  . Asthma   . Pancreatitis, acute 11/26/2014  . Hepatic steatosis 11/26/2014    and hepatomegaly  . Liver mass 11/26/2014  . Hypertension     NOT CURRENTLY ON ANY BP MED  . Heart murmur   . Arthritis   . Gallstones   . Anxiety     Past Surgical History  Procedure Laterality Date  . Wisdom tooth extraction    . Esophagogastroduodenoscopy (egd) with propofol Left 09/20/2014    Procedure: ESOPHAGOGASTRODUODENOSCOPY (EGD) WITH PROPOFOL;  Surgeon: Arta Silence, MD;  Location: [facility] ENDOSCOPY;  Service: Endoscopy;  Laterality: Left;  . Cholecystectomy N/A 02/11/2015    Procedure: LAPAROSCOPIC CHOLECYSTECTOMY WITH INTRAOPERATIVE CHOLANGIOGRAM;  Surgeon: Greer Pickerel, MD;  Location: [facility] ORS;  Service: General;  Laterality: N/A;     reports that she has never smoked. She has never used smokeless tobacco. She reports that she uses illicit drugs (Marijuana). She reports that she does not drink alcohol.  Allergies  Allergen Reactions  . Peanut-Containing Drug Products Swelling    Swelling of mouth, lips  . Strawberry Extract Swelling    Swelling of  mouth, lips    Family History  Problem Relation Age of Onset  . Diabetes Mother   . Hyperlipidemia Mother   . Hypertension Father   . Heart disease Father   . Heart disease Maternal Grandmother   . Heart disease Maternal Grandfather   . Hypertension Paternal Grandmother   . Cancer Paternal Grandfather      Prior to Admission medications   Medication Sig Start Date End Date Taking? Authorizing Provider  albuterol (PROVENTIL HFA;VENTOLIN HFA) 108 (90 BASE) MCG/ACT inhaler Inhale 2 puffs into the lungs every 6 (six) hours as needed for shortness of breath.   Yes Historical Provider, MD  albuterol (PROVENTIL) (2.5 MG/3ML) 0.083% nebulizer solution Take 2.5 mg by nebulization every 6 (six) hours as needed for shortness of breath.   Yes Historical Provider, MD  budesonide-formoterol (SYMBICORT) 160-4.5 MCG/ACT inhaler Inhale 2 puffs into the lungs daily.    Yes Historical Provider, MD  EPINEPHrine (EPIPEN) 0.3 mg/0.3 mL SOAJ injection Inject 0.3 mg into the muscle once. As needed for allergic reaction. Never had to use before   Yes Historical Provider, MD  escitalopram (LEXAPRO) 10 MG tablet Take 5 mg by mouth daily. 06/20/15 06/19/16 Yes Historical Provider, MD  insulin aspart (NOVOLOG FLEXPEN) 100 UNIT/ML FlexPen INJECT 0-20 UNITS INTO THE SKIN AS DIRECTED. SSI - CARB SCALE, 0-20 UNITS Patient taking differently: Inject 0-20 Units into the skin 4 (four) times daily as needed for high blood sugar. Sliding scale  10/19/13  Yes Elayne Snare, MD  Insulin Glargine (LANTUS SOLOSTAR) 100 UNIT/ML Solostar Pen Inject 24 Units into the skin daily at 10 pm. 05/25/15  Yes Domenic Polite, MD  metoCLOPramide (REGLAN) 10 MG tablet Take 1 tablet (10 mg total) by mouth 3 (three) times daily before meals. 06/11/15  Yes Shanker Kristeen Mans, MD  montelukast (SINGULAIR) 10 MG tablet Take 10 mg by mouth daily.   Yes Historical Provider, MD  omeprazole (PRILOSEC) 40 MG capsule Take 1 capsule (40 mg total) by mouth daily. 04/23/15   Yes Orson Eva, MD  ondansetron (ZOFRAN) 4 MG tablet Take 1 tablet (4 mg total) by mouth every 6 (six) hours. Patient taking differently: Take 4 mg by mouth every 8 (eight) hours as needed for nausea or vomiting.  04/23/15  Yes Farm[place] Lions, PA-C  feeding supplement, ENSURE ENLIVE, (ENSURE ENLIVE) LIQD Take 237 mLs by mouth 2 (two) times daily between meals. Patient not taking: Reported on 06/22/2015 06/11/15   Jonetta Osgood, MD    Physical Exam: Filed Vitals:   06/22/15 0400 06/22/15 0430 06/22/15 0500 06/22/15 0526  BP: 96/60 101/69 98/65 97/52   Pulse: 105 108 106 102  Temp:    97.7 F (36.5 C)  TempSrc:    Oral  Resp: 15 15 30 25   Height:      Weight:      SpO2: 97% 98% 98% 100%      Constitutional: NAD, calm, comfortable, Sedated Eyes: PERRL, lids and conjunctivae normal ENMT: Mucous membranes are moist. Posterior pharynx clear of any exudate or lesions.Normal dentition.  Neck: normal, supple, no masses, no thyromegaly Respiratory: clear to auscultation bilaterally, no wheezing, no crackles. Normal respiratory effort. No accessory muscle use.  Cardiovascular: Regular rate and rhythm, no murmurs / rubs / gallops. No extremity edema. 2+ pedal pulses. No carotid bruits.  Abdomen: no tenderness, no masses palpated. No hepatosplenomegaly. Bowel sounds positive.  Musculoskeletal: no clubbing / cyanosis. No joint deformity upper and lower extremities. Good ROM, no contractures. Normal muscle tone.  Skin: no rashes, lesions, ulcers. No induration Neurologic: Grossly non-focal but patient is sedated, eye opening to voice. Psychiatric: Normal judgment and insight. Alert and oriented x 3. Normal mood.    Labs on Admission: I have personally reviewed following labs and imaging studies  CBC:  Recent Labs Lab 06/22/15 0327  WBC 7.1  HGB 14.3  HCT 41.2  MCV 87.5  PLT [id number]*   Basic Metabolic Panel:  Recent Labs Lab 06/22/15 0327  NA 135  K 3.4*  CL 97*  CO2 19*    GLUCOSE 445*  BUN 21*  CREATININE 0.69  CALCIUM 9.7   GFR: Estimated Creatinine Clearance: 78.6 mL/min (by C-G formula based on Cr of 0.69). Liver Function Tests:  Recent Labs Lab 06/22/15 0327  AST 27  ALT 18  ALKPHOS 93  BILITOT 0.9  PROT 8.5*  ALBUMIN 4.4    Recent Labs Lab 06/22/15 0327  LIPASE 17   No results for input(s): AMMONIA in the last 168 hours. Coagulation Profile: No results for input(s): INR, PROTIME in the last 168 hours. Cardiac Enzymes: No results for input(s): CKTOTAL, CKMB, CKMBINDEX, TROPONINI in the last 168 hours. BNP (last 3 results) No results for input(s): PROBNP in the last 8760 hours. HbA1C: No results for input(s): HGBA1C in the last 72 hours. CBG:  Recent Labs Lab 06/22/15 0258 06/22/15 0436  GLUCAP 433* 176*   Lipid Profile: No results for input(s): CHOL, HDL, LDLCALC, TRIG, CHOLHDL,  LDLDIRECT in the last 72 hours. Thyroid Function Tests: No results for input(s): TSH, T4TOTAL, FREET4, T3FREE, THYROIDAB in the last 72 hours. Anemia Panel: No results for input(s): VITAMINB12, FOLATE, FERRITIN, TIBC, IRON, RETICCTPCT in the last 72 hours. Urine analysis:    Component Value Date/Time   COLORURINE YELLOW 06/10/2015 0430   APPEARANCEUR CLOUDY* 06/10/2015 0430   LABSPEC 1.031* 06/10/2015 0430   PHURINE 6.0 06/10/2015 0430   GLUCOSEU >1000* 06/10/2015 0430   GLUCOSEU >=1000 11/07/2012 1205   HGBUR TRACE* 06/10/2015 0430   BILIRUBINUR SMALL* 06/10/2015 0430   KETONESUR >80* 06/10/2015 0430   PROTEINUR NEGATIVE 06/10/2015 0430   UROBILINOGEN 0.2 11/24/2014 1045   NITRITE NEGATIVE 06/10/2015 0430   LEUKOCYTESUR TRACE* 06/10/2015 0430   Sepsis Labs: @LABRCNTIP (procalcitonin:4,lacticidven:4) )No results found for this or any previous visit (from the past 240 hour(s)).   Radiological Exams on Admission: No results found.  EKG: Independently reviewed.  Assessment/Plan Principal Problem:   DKA, type 1 (HCC) Active  Problems:   Gastroparesis   Altered mental status   DKA type 1 -  DKA pathway  Bolus done in ED  Insulin gtt per protocol  BMP per protocol  Gastroparesis -  Converting reglan to 10mg  IV QID (normally takes 10mg  PO QID).  Avoid narcotics  AMS - Iatrogenic due to numerous sedating meds given in ED, will put patient on tele monitor while awaiting meds to wear off.   DVT prophylaxis: Lovenox Code Status: full Family Communication: No family in room Consults called: None Admission status: Admit to obs   Zamariya Neal, [facility] Pager [phone] from 7PM-7AM  If 7AM-7PM, please contact the day physician for the patient [email or web address] Password [email or web address]  06/22/2015, 6:44 AM

## 2015-06-22 NOTE — Progress Notes (Signed)
Pt seen and examined, admitted this am 24/F with DM 1 and recurrent admissions with N/V from presumed gastroparesis, admitted with Hyperglycemia, mildk DKA, N/V Anion gap corrected this am and hasnt started Insulin gtt yet, will resume lantus and SSI. Diet when more awake, sedated with multiple meds in ED H/o Cannabis use likely precipitating cyclical vomiting too UDS pending Continue Reglan, IVF, SSI Needs GES as outpatient H/o EGD in 12/16: normal  Domenic Polite, MD

## 2015-06-22 NOTE — ED Notes (Signed)
Notified RN,Kari, pt. CBG 176.

## 2015-06-23 DIAGNOSIS — R400 Somnolence: Secondary | ICD-10-CM | POA: Diagnosis not present

## 2015-06-23 DIAGNOSIS — K3184 Gastroparesis: Secondary | ICD-10-CM | POA: Diagnosis not present

## 2015-06-23 DIAGNOSIS — E1010 Type 1 diabetes mellitus with ketoacidosis without coma: Secondary | ICD-10-CM | POA: Diagnosis not present

## 2015-06-23 DIAGNOSIS — E1065 Type 1 diabetes mellitus with hyperglycemia: Secondary | ICD-10-CM | POA: Diagnosis not present

## 2015-06-23 LAB — CBC
HCT: 33.2 % — ABNORMAL LOW (ref 36.0–46.0)
Hemoglobin: 11.3 g/dL — ABNORMAL LOW (ref 12.0–15.0)
MCH: 30.6 pg (ref 26.0–34.0)
MCHC: 34 g/dL (ref 30.0–36.0)
MCV: 90 fL (ref 78.0–100.0)
PLATELETS: 338 10*3/uL (ref 150–400)
RBC: 3.69 MIL/uL — ABNORMAL LOW (ref 3.87–5.11)
RDW: 13.1 % (ref 11.5–15.5)
WBC: 5.2 10*3/uL (ref 4.0–10.5)

## 2015-06-23 LAB — BASIC METABOLIC PANEL
ANION GAP: 7 (ref 5–15)
BUN: 12 mg/dL (ref 6–20)
CALCIUM: 8.5 mg/dL — AB (ref 8.9–10.3)
CO2: 24 mmol/L (ref 22–32)
CREATININE: 0.43 mg/dL — AB (ref 0.44–1.00)
Chloride: 102 mmol/L (ref 101–111)
GFR calc Af Amer: >60 mL/min (ref >60)
GLUCOSE: 360 mg/dL — AB (ref 65–99)
Potassium: 4.3 mmol/L (ref 3.5–5.1)
Sodium: 133 mmol/L — ABNORMAL LOW (ref 135–145)

## 2015-06-23 LAB — GLUCOSE, CAPILLARY
GLUCOSE-CAPILLARY: 240 mg/dL — AB (ref 65–99)
Glucose-Capillary: 326 mg/dL — ABNORMAL HIGH (ref 65–99)
Glucose-Capillary: 141 mg/dL — ABNORMAL HIGH (ref 65–99)
Glucose-Capillary: 194 mg/dL — ABNORMAL HIGH (ref 65–99)

## 2015-06-23 MED ORDER — DIPHENHYDRAMINE HCL 25 MG PO CAPS
25.0000 mg | ORAL_CAPSULE | Freq: Once | ORAL | Status: AC
  Administered 2015-06-23: 25 mg via ORAL
  Filled 2015-06-23: qty 1

## 2015-06-23 MED ORDER — INSULIN ASPART 100 UNIT/ML ~~LOC~~ SOLN: 0.0000 [IU] | Freq: Every day | SUBCUTANEOUS | Status: DC

## 2015-06-23 MED ORDER — ACETAMINOPHEN 325 MG PO TABS: 650.0000 mg | ORAL_TABLET | Freq: Four times a day (QID) | ORAL | Status: DC | PRN

## 2015-06-23 MED ORDER — INSULIN ASPART 100 UNIT/ML ~~LOC~~ SOLN
2.0000 [IU] | Freq: Three times a day (TID) | SUBCUTANEOUS | Status: DC
  Administered 2015-06-23 – 2015-06-24 (×3): 2 [IU] via SUBCUTANEOUS

## 2015-06-23 MED ORDER — INSULIN ASPART 100 UNIT/ML ~~LOC~~ SOLN
0.0000 [IU] | Freq: Three times a day (TID) | SUBCUTANEOUS | Status: DC
  Administered 2015-06-23: 1 [IU] via SUBCUTANEOUS
  Administered 2015-06-23: 3 [IU] via SUBCUTANEOUS

## 2015-06-23 MED ORDER — INSULIN GLARGINE 100 UNIT/ML ~~LOC~~ SOLN
12.0000 [IU] | Freq: Every day | SUBCUTANEOUS | Status: AC
  Administered 2015-06-23: 12 [IU] via SUBCUTANEOUS
  Filled 2015-06-23: qty 0.12

## 2015-06-23 MED ORDER — INSULIN GLARGINE 100 UNIT/ML ~~LOC~~ SOLN
25.0000 [IU] | Freq: Every day | SUBCUTANEOUS | Status: DC
  Administered 2015-06-23: 25 [IU] via SUBCUTANEOUS
  Filled 2015-06-23: qty 0.25

## 2015-06-23 NOTE — Progress Notes (Signed)
Pt reported itching after administration of tramadol. MD paged. Orders received and implemented. See mar.

## 2015-06-23 NOTE — Progress Notes (Addendum)
PROGRESS NOTE  [name] [id number] DOB: [birth year] DOA: 06/22/2015 PCP: Vicenta Aly, FNP Brief History:  25 year old female with a history of diabetes mellitus type 1, recent pancreatitis, liver adenomas, anxiety, hepatic steatosis, asthma presented with 2-3 day history of nausea, vomiting. The patient has had numerous admissions in the past 6 months secondary to DKA as well as for N/V. Most recently, the patient was discharged after an admission from 03/21/2015 through 03/25/2015 and an admission from 04/08/2015 through 04/15/2015 for DKA. She was also recently discharged on 05/26/2015 for treatment of intractable nausea and vomiting. During her admissions, it has been characteristic for slow recovery due to slow recovering metabolic acidosis which is initially gapped and subsequently nongapped. In addition, her admissions have been characterized by profound volume depletion requiring significant fluid resuscitation which subsequently resulted in improvement in the patient's sinus tachycardia and metabolic acidosis. Finally, her previous admissions have been characterized by opioid dependence for her "abdominal pain"which has resulted in exacerbation of her presumed gastroparesis resulting in slow recovery of her nausea and vomiting.  Assessment/Plan: DKA, type 1 -mild -corrected with IVf and Insulin, missed lantus dose PTA -increase lantus back to home dose QHS and give extra this am x1  Nausea and vomiting/Abdominal pain -likely gastroparesis related to her DM  -04/21/2015 CT abdomen and pelvis--stable hepatic masses, normal pancreas, normal bowel wall thickening or inflammation, normal spleen and pancreas -09/23/2014 EGD--diffuse gastritis without clear explanation of the patient's hematemesis -Continue IV fluid, metoclopramide -avoid narcotics -needs gastric emptying scan as outpatient -continue PPI  DM type I, uncontrolled -last Hbaic was 9.3 -as  above  Asthma -stable, nebs PRN  Severe protein calorie malnutrition -supplement diet as tol  Full Code Family Communication: none at bedside Dispo: home tomorrow if [employer], CBGs better  Subjective: Some nausea, no vomiting  Objective: Filed Vitals:   06/22/15 1808 06/22/15 2100 06/23/15 0136 06/23/15 0543  BP: 128/76 121/79 119/80 119/82  Pulse: 113 106 95 91  Temp: 98.9 F (37.2 C) 98.4 F (36.9 C) 98.7 F (37.1 C) 98.2 F (36.8 C)  TempSrc: Oral Oral Oral Oral  Resp: 16 16 18 16   Height:      Weight:      SpO2: 100% 99% 99% 100%    Intake/Output Summary (Last 24 hours) at 06/23/15 0950 Last data filed at 06/23/15 0848  Gross per 24 hour  Intake 2101.25 ml  Output   2050 ml  Net  51.25 ml   Weight change:  Exam:   General:  Pt is alert, follows commands appropriately, not in acute distress  HEENT: No icterus, No thrush, No neck mass, [place]/AT  Cardiovascular: RRR, S1/S2, no rubs, no gallops  Respiratory: CTA bilaterally, no wheezing, no crackles, no rhonchi  Abdomen: Soft/+BS, non tender, non distended, no guarding  Extremities: No edema, No lymphangitis, No petechiae, No rashes, no synovitis   Data Reviewed: I have personally reviewed following labs and imaging studies Basic Metabolic Panel:  Recent Labs Lab 06/22/15 0327 06/22/15 0725 06/23/15 0402  NA 135 137 133*  K 3.4* 4.0 4.3  CL 97* 105 102  CO2 19* 23 24  GLUCOSE 445* 299* 360*  BUN 21* 18 12  CREATININE 0.69 0.49 0.43*  CALCIUM 9.7 8.2* 8.5*   Liver Function Tests:  Recent Labs Lab 06/22/15 0327  AST 27  ALT 18  ALKPHOS 93  BILITOT 0.9  PROT 8.5*  ALBUMIN 4.4    Recent Labs Lab  06/22/15 0327  LIPASE 17   No results for input(s): AMMONIA in the last 168 hours. Coagulation Profile: No results for input(s): INR, PROTIME in the last 168 hours. CBC:  Recent Labs Lab 06/22/15 0327 06/23/15 0402  WBC 7.1 5.2  HGB 14.3 11.3*  HCT 41.2 33.2*  MCV 87.5 90.0   PLT 448* 338   Cardiac Enzymes: No results for input(s): CKTOTAL, CKMB, CKMBINDEX, TROPONINI in the last 168 hours. BNP: Invalid input(s): POCBNP CBG:  Recent Labs Lab 06/22/15 0832 06/22/15 1136 06/22/15 1713 06/22/15 2128 06/23/15 0833  GLUCAP 323* 375* 190* 285* 326*   HbA1C: No results for input(s): HGBA1C in the last 72 hours. Urine analysis:    Component Value Date/Time   COLORURINE YELLOW 06/22/2015 1140   APPEARANCEUR CLOUDY* 06/22/2015 1140   LABSPEC 1.039* 06/22/2015 1140   PHURINE 5.5 06/22/2015 1140   GLUCOSEU >1000* 06/22/2015 1140   GLUCOSEU >=1000 11/07/2012 1205   HGBUR NEGATIVE 06/22/2015 1140   BILIRUBINUR NEGATIVE 06/22/2015 1140   KETONESUR >80* 06/22/2015 1140   PROTEINUR NEGATIVE 06/22/2015 1140   UROBILINOGEN 0.2 11/24/2014 1045   NITRITE NEGATIVE 06/22/2015 1140   LEUKOCYTESUR NEGATIVE 06/22/2015 1140   Sepsis Labs: @LABRCNTIP (procalcitonin:4,lacticidven:4) )No results found for this or any previous visit (from the past 240 hour(s)).   Scheduled Meds: . enoxaparin (LOVENOX) injection  40 mg Subcutaneous Q24H  . escitalopram  5 mg Oral Daily  . insulin aspart  0-15 Units Subcutaneous TID WC  . insulin glargine  25 Units Subcutaneous QHS  . metoCLOPramide (REGLAN) injection  10 mg Intravenous Q6H  . mometasone-formoterol  2 puff Inhalation BID  . montelukast  10 mg Oral Daily  . pantoprazole  80 mg Oral Daily   Continuous Infusions: . sodium chloride 75 mL/hr at 06/22/15 2108    Procedures/Studies: Dg Abd Acute W/chest  06/10/2015  CLINICAL DATA:  Acute onset of nausea, vomiting and generalized abdominal pain. Initial encounter. EXAM: DG ABDOMEN ACUTE W/ 1V CHEST COMPARISON:  Chest and abdominal radiographs performed 03/22/2015, and CT of the abdomen and pelvis performed 04/21/2015 FINDINGS: The lungs are well-aerated and clear. There is no evidence of focal opacification, pleural effusion or pneumothorax. The cardiomediastinal  silhouette is within normal limits. The visualized bowel gas pattern is unremarkable. Scattered stool and air are seen within the colon; there is no evidence of small bowel dilatation to suggest obstruction. No free intra-abdominal air is identified on the provided decubitus view. Clips are noted within the right upper quadrant, reflecting prior cholecystectomy. No acute osseous abnormalities are seen; the sacroiliac joints are unremarkable in appearance. IMPRESSION: 1. Unremarkable bowel gas pattern; no free intra-abdominal air seen. 2. No acute cardiopulmonary process seen. Electronically Signed   By: Garald Balding M.D.   On: 06/10/2015 00:00    Domenic Polite, MD [facility] Pager [phone]  Time spent: 47min  If 7PM-7AM, please contact night-coverage [email or web address] Password [email or web address] 06/23/2015, 9:50 AM

## 2015-06-24 ENCOUNTER — Other Ambulatory Visit: Payer: Self-pay | Admitting: Gastroenterology

## 2015-06-24 DIAGNOSIS — E1065 Type 1 diabetes mellitus with hyperglycemia: Secondary | ICD-10-CM

## 2015-06-24 DIAGNOSIS — K769 Liver disease, unspecified: Secondary | ICD-10-CM

## 2015-06-24 DIAGNOSIS — K3184 Gastroparesis: Secondary | ICD-10-CM | POA: Diagnosis not present

## 2015-06-24 DIAGNOSIS — E1010 Type 1 diabetes mellitus with ketoacidosis without coma: Secondary | ICD-10-CM | POA: Diagnosis not present

## 2015-06-24 LAB — GLUCOSE, CAPILLARY
GLUCOSE-CAPILLARY: 45 mg/dL — AB (ref 65–99)
Glucose-Capillary: 129 mg/dL — ABNORMAL HIGH (ref 65–99)
Glucose-Capillary: 107 mg/dL — ABNORMAL HIGH (ref 65–99)

## 2015-06-24 LAB — BASIC METABOLIC PANEL
Anion gap: 4 — ABNORMAL LOW (ref 5–15)
BUN: 8 mg/dL (ref 6–20)
CALCIUM: 8.6 mg/dL — AB (ref 8.9–10.3)
CHLORIDE: 105 mmol/L (ref 101–111)
CO2: 29 mmol/L (ref 22–32)
CREATININE: 0.39 mg/dL — AB (ref 0.44–1.00)
GFR calc non Af Amer: >60 mL/min (ref >60)
Glucose, Bld: 45 mg/dL — ABNORMAL LOW (ref 65–99)
Potassium: 3.5 mmol/L (ref 3.5–5.1)
SODIUM: 138 mmol/L (ref 135–145)

## 2015-06-24 LAB — CBC
HCT: 33 % — ABNORMAL LOW (ref 36.0–46.0)
HEMOGLOBIN: 11.3 g/dL — AB (ref 12.0–15.0)
MCH: 29.8 pg (ref 26.0–34.0)
MCHC: 34.2 g/dL (ref 30.0–36.0)
MCV: 87.1 fL (ref 78.0–100.0)
Platelets: 370 10*3/uL (ref 150–400)
RBC: 3.79 MIL/uL — ABNORMAL LOW (ref 3.87–5.11)
RDW: 12.9 % (ref 11.5–15.5)
WBC: 6.5 10*3/uL (ref 4.0–10.5)

## 2015-06-24 MED ORDER — DIPHENHYDRAMINE HCL 25 MG PO CAPS
25.0000 mg | ORAL_CAPSULE | Freq: Three times a day (TID) | ORAL | Status: DC | PRN
  Administered 2015-06-24: 25 mg via ORAL
  Filled 2015-06-24: qty 1

## 2015-06-24 NOTE — Progress Notes (Signed)
Hypoglycemic Event  CBG: 45  Treatment: 15 GM carbohydrate snack  Symptoms: Pale and Shaky  Follow-up CBG: Time: [id number] CBG Result: 129  Possible Reasons for Event: medication regimen  Comments/MD notified: Pt stable    Marcy Salvo

## 2015-06-24 NOTE — Progress Notes (Signed)
Pt's vitals are wnl, tolerating diet and pain is under control. Discussed discharge instructions with patient. Discharged to home.

## 2015-07-01 ENCOUNTER — Encounter (HOSPITAL_COMMUNITY): Payer: Self-pay | Admitting: Nurse Practitioner

## 2015-07-01 ENCOUNTER — Inpatient Hospital Stay (HOSPITAL_COMMUNITY)
Admission: EM | Admit: 2015-07-01 | Discharge: 2015-07-06 | DRG: 639 | Disposition: A | Discharge status: Home / Self Care | Payer: BLUE CROSS/BLUE SHIELD | Attending: Internal Medicine | Admitting: Internal Medicine

## 2015-07-01 ENCOUNTER — Inpatient Hospital Stay: Admission: RE | Admit: 2015-07-01 | Payer: BLUE CROSS/BLUE SHIELD | Source: Ambulatory Visit

## 2015-07-01 DIAGNOSIS — J45909 Unspecified asthma, uncomplicated: Secondary | ICD-10-CM | POA: Diagnosis present

## 2015-07-01 DIAGNOSIS — Z885 Allergy status to narcotic agent status: Secondary | ICD-10-CM | POA: Diagnosis not present

## 2015-07-01 DIAGNOSIS — E1065 Type 1 diabetes mellitus with hyperglycemia: Secondary | ICD-10-CM | POA: Diagnosis present

## 2015-07-01 DIAGNOSIS — E1043 Type 1 diabetes mellitus with diabetic autonomic (poly)neuropathy: Secondary | ICD-10-CM | POA: Diagnosis present

## 2015-07-01 DIAGNOSIS — Z9049 Acquired absence of other specified parts of digestive tract: Secondary | ICD-10-CM | POA: Diagnosis not present

## 2015-07-01 DIAGNOSIS — Z794 Long term (current) use of insulin: Secondary | ICD-10-CM | POA: Diagnosis not present

## 2015-07-01 DIAGNOSIS — IMO0002 Reserved for concepts with insufficient information to code with codable children: Secondary | ICD-10-CM | POA: Diagnosis present

## 2015-07-01 DIAGNOSIS — K3184 Gastroparesis: Secondary | ICD-10-CM | POA: Diagnosis present

## 2015-07-01 DIAGNOSIS — E1010 Type 1 diabetes mellitus with ketoacidosis without coma: Secondary | ICD-10-CM | POA: Diagnosis present

## 2015-07-01 DIAGNOSIS — E1311 Other specified diabetes mellitus with ketoacidosis with coma: Secondary | ICD-10-CM | POA: Diagnosis not present

## 2015-07-01 DIAGNOSIS — R109 Unspecified abdominal pain: Secondary | ICD-10-CM | POA: Diagnosis present

## 2015-07-01 DIAGNOSIS — R112 Nausea with vomiting, unspecified: Secondary | ICD-10-CM | POA: Diagnosis present

## 2015-07-01 DIAGNOSIS — E876 Hypokalemia: Secondary | ICD-10-CM | POA: Diagnosis not present

## 2015-07-01 DIAGNOSIS — E0810 Diabetes mellitus due to underlying condition with ketoacidosis without coma: Secondary | ICD-10-CM | POA: Diagnosis not present

## 2015-07-01 DIAGNOSIS — I10 Essential (primary) hypertension: Secondary | ICD-10-CM | POA: Diagnosis present

## 2015-07-01 DIAGNOSIS — E1143 Type 2 diabetes mellitus with diabetic autonomic (poly)neuropathy: Secondary | ICD-10-CM | POA: Diagnosis not present

## 2015-07-01 DIAGNOSIS — R1110 Vomiting, unspecified: Secondary | ICD-10-CM | POA: Diagnosis not present

## 2015-07-01 DIAGNOSIS — E1110 Type 2 diabetes mellitus with ketoacidosis without coma: Secondary | ICD-10-CM | POA: Diagnosis present

## 2015-07-01 LAB — CBC WITH DIFFERENTIAL/PLATELET
BASOS PCT: 0 %
Basophils Absolute: 0 10*3/uL (ref 0.0–0.1)
Basophils Absolute: 0 10*3/uL (ref 0.0–0.1)
Basophils Relative: 0 %
Eosinophils Absolute: 0.1 10*3/uL (ref 0.0–0.7)
Eosinophils Absolute: 0 10*3/uL (ref 0.0–0.7)
Eosinophils Relative: 1 %
Eosinophils Relative: 0 %
HCT: 45 % (ref 36.0–46.0)
HEMATOCRIT: 42 % (ref 36.0–46.0)
HEMOGLOBIN: 13.7 g/dL (ref 12.0–15.0)
Hemoglobin: 15.2 g/dL — ABNORMAL HIGH (ref 12.0–15.0)
LYMPHS PCT: 14 %
Lymphocytes Relative: 21 %
Lymphs Abs: 1.8 10*3/uL (ref 0.7–4.0)
Lymphs Abs: 2.6 10*3/uL (ref 0.7–4.0)
MCH: 30.8 pg (ref 26.0–34.0)
MCH: 31.1 pg (ref 26.0–34.0)
MCHC: 33.8 g/dL (ref 30.0–36.0)
MCHC: 32.6 g/dL (ref 30.0–36.0)
MCV: 91.3 fL (ref 78.0–100.0)
MCV: 95.2 fL (ref 78.0–100.0)
MONOS PCT: 6 %
Monocytes Absolute: 0.4 10*3/uL (ref 0.1–1.0)
Monocytes Absolute: 1.2 10*3/uL — ABNORMAL HIGH (ref 0.1–1.0)
Monocytes Relative: 5 %
NEUTROS ABS: 14.8 10*3/uL — AB (ref 1.7–7.7)
NEUTROS PCT: 80 %
Neutro Abs: 6.3 10*3/uL (ref 1.7–7.7)
Neutrophils Relative %: 73 %
Platelets: 581 10*3/uL — ABNORMAL HIGH (ref 150–400)
Platelets: 564 10*3/uL — ABNORMAL HIGH (ref 150–400)
RBC: 4.93 MIL/uL (ref 3.87–5.11)
RBC: 4.41 MIL/uL (ref 3.87–5.11)
RDW: 13.9 % (ref 11.5–15.5)
RDW: 14 % (ref 11.5–15.5)
WBC: 8.6 10*3/uL (ref 4.0–10.5)
WBC: 18.6 10*3/uL — ABNORMAL HIGH (ref 4.0–10.5)

## 2015-07-01 LAB — CBG MONITORING, ED
Glucose-Capillary: 456 mg/dL — ABNORMAL HIGH (ref 65–99)
Glucose-Capillary: 489 mg/dL — ABNORMAL HIGH (ref 65–99)
Glucose-Capillary: 313 mg/dL — ABNORMAL HIGH (ref 65–99)

## 2015-07-01 LAB — BASIC METABOLIC PANEL
ANION GAP: 20 — AB (ref 5–15)
Anion gap: 17 — ABNORMAL HIGH (ref 5–15)
BUN: 15 mg/dL (ref 6–20)
BUN: 15 mg/dL (ref 6–20)
CHLORIDE: 109 mmol/L (ref 101–111)
CO2: 18 mmol/L — ABNORMAL LOW (ref 22–32)
CO2: 7 mmol/L — AB (ref 22–32)
Calcium: 9.8 mg/dL (ref 8.9–10.3)
Calcium: 8.6 mg/dL — ABNORMAL LOW (ref 8.9–10.3)
Chloride: 101 mmol/L (ref 101–111)
Creatinine, Ser: 0.66 mg/dL (ref 0.44–1.00)
Creatinine, Ser: 1 mg/dL (ref 0.44–1.00)
GFR calc Af Amer: >60 mL/min (ref >60)
GFR calc non Af Amer: >60 mL/min (ref >60)
GFR calc non Af Amer: >60 mL/min (ref >60)
Glucose, Bld: 253 mg/dL — ABNORMAL HIGH (ref 65–99)
Glucose, Bld: 492 mg/dL — ABNORMAL HIGH (ref 65–99)
Potassium: 4.1 mmol/L (ref 3.5–5.1)
Potassium: 5.4 mmol/L — ABNORMAL HIGH (ref 3.5–5.1)
Sodium: 136 mmol/L (ref 135–145)
Sodium: 136 mmol/L (ref 135–145)

## 2015-07-01 LAB — URINALYSIS, ROUTINE W REFLEX MICROSCOPIC
Bilirubin Urine: NEGATIVE
Glucose, UA: >1000 mg/dL — AB
Hgb urine dipstick: NEGATIVE
Ketones, ur: >80 mg/dL — AB
Leukocytes, UA: NEGATIVE
Nitrite: NEGATIVE
Protein, ur: NEGATIVE mg/dL
Specific Gravity, Urine: 1.034 — ABNORMAL HIGH (ref 1.005–1.030)
pH: 5.5 (ref 5.0–8.0)

## 2015-07-01 LAB — GLUCOSE, CAPILLARY: GLUCOSE-CAPILLARY: 222 mg/dL — AB (ref 65–99)

## 2015-07-01 LAB — URINE MICROSCOPIC-ADD ON: RBC / HPF: NONE SEEN RBC/hpf (ref 0–5)

## 2015-07-01 MED ORDER — METOCLOPRAMIDE HCL 5 MG/ML IJ SOLN
10.0000 mg | Freq: Once | INTRAMUSCULAR | Status: AC
  Administered 2015-07-01: 10 mg via INTRAVENOUS
  Filled 2015-07-01: qty 2

## 2015-07-01 MED ORDER — DEXTROSE-NACL 5-0.45 % IV SOLN: INTRAVENOUS | Status: DC

## 2015-07-01 MED ORDER — SODIUM CHLORIDE 0.9 % IV SOLN
INTRAVENOUS | Status: DC
  Administered 2015-07-01: 23:00:00 via INTRAVENOUS

## 2015-07-01 MED ORDER — MOMETASONE FURO-FORMOTEROL FUM 200-5 MCG/ACT IN AERO
2.0000 | INHALATION_SPRAY | Freq: Two times a day (BID) | RESPIRATORY_TRACT | Status: DC
  Filled 2015-07-01: qty 8.8

## 2015-07-01 MED ORDER — HYDROMORPHONE HCL 1 MG/ML IJ SOLN
1.0000 mg | Freq: Once | INTRAMUSCULAR | Status: AC
  Administered 2015-07-01: 1 mg via INTRAVENOUS
  Filled 2015-07-01: qty 1

## 2015-07-01 MED ORDER — ENOXAPARIN SODIUM 40 MG/0.4ML ~~LOC~~ SOLN
40.0000 mg | Freq: Every day | SUBCUTANEOUS | Status: DC
  Administered 2015-07-02 – 2015-07-05 (×5): 40 mg via SUBCUTANEOUS
  Filled 2015-07-01 (×5): qty 0.4

## 2015-07-01 MED ORDER — ALBUTEROL SULFATE HFA 108 (90 BASE) MCG/ACT IN AERS: 2.0000 | INHALATION_SPRAY | Freq: Four times a day (QID) | RESPIRATORY_TRACT | Status: DC | PRN

## 2015-07-01 MED ORDER — INSULIN ASPART 100 UNIT/ML ~~LOC~~ SOLN
10.0000 [IU] | Freq: Once | SUBCUTANEOUS | Status: AC
  Administered 2015-07-01: 10 [IU] via INTRAVENOUS
  Filled 2015-07-01: qty 1

## 2015-07-01 MED ORDER — FLUCONAZOLE 200 MG PO TABS
200.0000 mg | ORAL_TABLET | Freq: Once | ORAL | Status: AC
  Administered 2015-07-01: 200 mg via ORAL
  Filled 2015-07-01: qty 1

## 2015-07-01 MED ORDER — FAMOTIDINE IN NACL 20-0.9 MG/50ML-% IV SOLN
20.0000 mg | Freq: Two times a day (BID) | INTRAVENOUS | Status: DC
  Administered 2015-07-02 – 2015-07-05 (×8): 20 mg via INTRAVENOUS
  Filled 2015-07-01 (×9): qty 50

## 2015-07-01 MED ORDER — MONTELUKAST SODIUM 10 MG PO TABS
10.0000 mg | ORAL_TABLET | Freq: Every day | ORAL | Status: DC
  Administered 2015-07-04 – 2015-07-06 (×3): 10 mg via ORAL
  Filled 2015-07-01 (×3): qty 1

## 2015-07-01 MED ORDER — SODIUM CHLORIDE 0.9 % IV BOLUS (SEPSIS)
1000.0000 mL | Freq: Once | INTRAVENOUS | Status: AC
  Administered 2015-07-01: 1000 mL via INTRAVENOUS

## 2015-07-01 MED ORDER — DEXTROSE-NACL 5-0.45 % IV SOLN
INTRAVENOUS | Status: DC
  Administered 2015-07-02 – 2015-07-04 (×8): via INTRAVENOUS

## 2015-07-01 MED ORDER — ALBUTEROL SULFATE (2.5 MG/3ML) 0.083% IN NEBU: 2.5000 mg | INHALATION_SOLUTION | Freq: Four times a day (QID) | RESPIRATORY_TRACT | Status: DC | PRN

## 2015-07-01 MED ORDER — ONDANSETRON HCL 4 MG/2ML IJ SOLN
4.0000 mg | Freq: Four times a day (QID) | INTRAMUSCULAR | Status: DC | PRN
  Administered 2015-07-02 – 2015-07-05 (×10): 4 mg via INTRAVENOUS
  Filled 2015-07-01 (×10): qty 2

## 2015-07-01 MED ORDER — MORPHINE SULFATE (PF) 2 MG/ML IV SOLN
2.0000 mg | INTRAVENOUS | Status: DC | PRN
  Administered 2015-07-02 – 2015-07-05 (×19): 2 mg via INTRAVENOUS
  Filled 2015-07-01 (×19): qty 1

## 2015-07-01 MED ORDER — ESCITALOPRAM OXALATE 10 MG PO TABS
5.0000 mg | ORAL_TABLET | Freq: Every day | ORAL | Status: DC
  Administered 2015-07-04 – 2015-07-06 (×3): 5 mg via ORAL
  Filled 2015-07-01 (×5): qty 0.5

## 2015-07-01 MED ORDER — SODIUM CHLORIDE 0.9 % IV SOLN
INTRAVENOUS | Status: DC
  Administered 2015-07-01: 4.3 [IU]/h via INTRAVENOUS
  Administered 2015-07-03: 2.1 [IU]/h via INTRAVENOUS
  Administered 2015-07-04: 1.7 [IU]/h via INTRAVENOUS
  Filled 2015-07-01 (×3): qty 2.5

## 2015-07-01 MED ORDER — ONDANSETRON HCL 4 MG/2ML IJ SOLN
4.0000 mg | Freq: Once | INTRAMUSCULAR | Status: AC
  Administered 2015-07-01: 4 mg via INTRAVENOUS
  Filled 2015-07-01: qty 2

## 2015-07-01 MED ORDER — SODIUM CHLORIDE 0.9 % IV BOLUS (SEPSIS)
1000.0000 mL | Freq: Once | INTRAVENOUS | Status: DC
  Administered 2015-07-01: 1000 mL via INTRAVENOUS

## 2015-07-01 NOTE — ED Provider Notes (Signed)
Care assumed from Dr. Wilson Singer at 32 with plan for f/u lab work to r/o DKA.   Results:  BP 144/98 mmHg  Pulse 139  Temp(Src) 98 F (36.7 C) (Oral)  Resp 26  SpO2 100%  LMP 05/31/2015 (Approximate)  Results for orders placed or performed during the hospital encounter of 07/01/15  CBC with Differential  Result Value Ref Range   WBC 8.6 4.0 - 10.5 K/uL   RBC 4.93 3.87 - 5.11 MIL/uL   Hemoglobin 15.2 (H) 12.0 - 15.0 g/dL   HCT 45.0 36.0 - 46.0 %   MCV 91.3 78.0 - 100.0 fL   MCH 30.8 26.0 - 34.0 pg   MCHC 33.8 30.0 - 36.0 g/dL   RDW 13.9 11.5 - 15.5 %   Platelets 581 (H) 150 - 400 K/uL   Neutrophils Relative % 73 %   Neutro Abs 6.3 1.7 - 7.7 K/uL   Lymphocytes Relative 21 %   Lymphs Abs 1.8 0.7 - 4.0 K/uL   Monocytes Relative 5 %   Monocytes Absolute 0.4 0.1 - 1.0 K/uL   Eosinophils Relative 1 %   Eosinophils Absolute 0.1 0.0 - 0.7 K/uL   Basophils Relative 0 %   Basophils Absolute 0.0 0.0 - 0.1 K/uL  Basic metabolic panel  Result Value Ref Range   Sodium 136 135 - 145 mmol/L   Potassium 4.1 3.5 - 5.1 mmol/L   Chloride 101 101 - 111 mmol/L   CO2 18 (L) 22 - 32 mmol/L   Glucose, Bld 253 (H) 65 - 99 mg/dL   BUN 15 6 - 20 mg/dL   Creatinine, Ser 0.66 0.44 - 1.00 mg/dL   Calcium 9.8 8.9 - 10.3 mg/dL   GFR calc non Af Amer >60 >60 mL/min   GFR calc Af Amer >60 >60 mL/min   Anion gap 17 (H) 5 - 15  Urinalysis, Routine w reflex microscopic (not at [facility])  Result Value Ref Range   Color, Urine YELLOW YELLOW   APPearance CLEAR CLEAR   Specific Gravity, Urine 1.034 (H) 1.005 - 1.030   pH 5.5 5.0 - 8.0   Glucose, UA >1000 (A) NEGATIVE mg/dL   Hgb urine dipstick NEGATIVE NEGATIVE   Bilirubin Urine NEGATIVE NEGATIVE   Ketones, ur >80 (A) NEGATIVE mg/dL   Protein, ur NEGATIVE NEGATIVE mg/dL   Nitrite NEGATIVE NEGATIVE   Leukocytes, UA NEGATIVE NEGATIVE  CBC with Differential  Result Value Ref Range   WBC 18.6 (H) 4.0 - 10.5 K/uL   RBC 4.41 3.87 - 5.11 MIL/uL   Hemoglobin  13.7 12.0 - 15.0 g/dL   HCT 42.0 36.0 - 46.0 %   MCV 95.2 78.0 - 100.0 fL   MCH 31.1 26.0 - 34.0 pg   MCHC 32.6 30.0 - 36.0 g/dL   RDW 14.0 11.5 - 15.5 %   Platelets 564 (H) 150 - 400 K/uL   Neutrophils Relative % 80 %   Neutro Abs 14.8 (H) 1.7 - 7.7 K/uL   Lymphocytes Relative 14 %   Lymphs Abs 2.6 0.7 - 4.0 K/uL   Monocytes Relative 6 %   Monocytes Absolute 1.2 (H) 0.1 - 1.0 K/uL   Eosinophils Relative 0 %   Eosinophils Absolute 0.0 0.0 - 0.7 K/uL   Basophils Relative 0 %   Basophils Absolute 0.0 0.0 - 0.1 K/uL  Basic metabolic panel  Result Value Ref Range   Sodium 136 135 - 145 mmol/L   Potassium 5.4 (H) 3.5 - 5.1 mmol/L   Chloride  109 101 - 111 mmol/L   CO2 7 (L) 22 - 32 mmol/L   Glucose, Bld 492 (H) 65 - 99 mg/dL   BUN 15 6 - 20 mg/dL   Creatinine, Ser 1.00 0.44 - 1.00 mg/dL   Calcium 8.6 (L) 8.9 - 10.3 mg/dL   GFR calc non Af Amer >60 >60 mL/min   GFR calc Af Amer >60 >60 mL/min   Anion gap 20 (H) 5 - 15  Urine microscopic-add on  Result Value Ref Range   Squamous Epithelial / LPF 0-5 (A) NONE SEEN   WBC, UA 0-5 0 - 5 WBC/hpf   RBC / HPF NONE SEEN 0 - 5 RBC/hpf   Bacteria, UA RARE (A) NONE SEEN   Urine-Other YEAST PRESENT   CBG monitoring, ED  Result Value Ref Range   Glucose-Capillary 456 (H) 65 - 99 mg/dL  CBG monitoring, ED  Result Value Ref Range   Glucose-Capillary 489 (H) 65 - 99 mg/dL    No results found.  Radiology and laboratory examinations were reviewed by me and used in medical decision making if performed.   MDM:  There was difficulty having lab run the blood samples d/t clerical error with wrong labels so lab work was delayed 6 hours. When returned it was apparent the Pt had been in DKA with CO2 of 18 and gap of 17. Due to long delay in this information insulin bolus and infusion were started and labs were redrawn showing worsening gap acidosis. Hospitalist was consulted for admission and will see the patient in the emergency department.  Respirations increased up to 24. Will require stepdown level of care for close monitoring until gap closes.   CRITICAL CARE Performed by: Leo Grosser Total critical care time: 30 minutes Critical care time was exclusive of separately billable procedures and treating other patients. Critical care was necessary to treat or prevent imminent or life-threatening deterioration. Critical care was time spent personally by me on the following activities: development of treatment plan with patient and/or surrogate as well as nursing, discussions with consultants, evaluation of patient's response to treatment, examination of patient, obtaining history from patient or surrogate, ordering and performing treatments and interventions, ordering and review of laboratory studies, ordering and review of radiographic studies, pulse oximetry and re-evaluation of patient's condition.   Diagnoses that have been ruled out:  None  Diagnoses that are still under consideration:  None  Final diagnoses:  Non-intractable vomiting with nausea, vomiting of unspecified type  Abdominal pain, unspecified abdominal location  Diabetic ketoacidosis without coma associated with type 1 diabetes mellitus (HCC)     Leo Grosser, MD 07/01/15 2246

## 2015-07-01 NOTE — ED Notes (Signed)
Pt requested a drink while. Mouth swab relief offered but declined.

## 2015-07-01 NOTE — H&P (Signed)
History and Physical  [name] [id number] DOB: 1990/11/15 DOA: 07/01/2015  PCP:  Vicenta Aly, FNP   Chief Complaint:  Nausea and vomiting.  History of Present Illness:  Patient is a 24 year old female with history of DMI, asthma and gastroparesis who came today with cc of N/V. She saw some blood in her vomitus after repeated vomiting but no coffee ground hematemesis. She has diffuse abdominal pain without diarrhea. No fever or chills. No chest pain,dyspnea or cough.she has headache.   Review of Systems:  CONSTITUTIONAL:     No night sweats.  No fatigue.  No fever. No chills. Eyes:                            No visual changes.  No eye pain.  No eye discharge.   ENT:                              No epistaxis.  No sinus pain.  No sore throat.   No congestion. RESPIRATORY:           No cough.  No wheeze.  No hemoptysis.  No dyspnea CARDIOVASCULAR   :  No chest pains.  No palpitations. GASTROINTESTINAL:  +abdominal pain.  +nausea. +vomiting.  No diarrhea. No constipation.  No hematemesis.  No hematochezia.  No melena. GENITOURINARY:      No urgency.  No frequency.  No dysuria.  No hematuria.  No   obstructive symptoms.  No discharge.  No pain.   MUSCULOSKELETAL:  No musculoskeletal pain.  No joint swelling.  No arthritis. NEUROLOGICAL:        No confusion.  No weakness. + headache. No seizure. PSYCHIATRIC:             No depression. No anxiety. No suicidal ideation. SKIN:                             No rashes.  No lesions.  No wounds. ENDOCRINE:                No weight loss.  No polydipsia.  No polyuria.  No polyphagia. HEMATOLOGIC:           No purpura.  No petechiae.  No bleeding.  ALLERGIC                    : No pruritus.  No angioedema Other:  Past Medical and Surgical History:   Past Medical History  Diagnosis Date  . Diabetes mellitus 2007    IDDM.  poorly controlled, multiple admits with DKA  . Asthma   . Pancreatitis, acute 11/26/2014  . Hepatic  steatosis 11/26/2014    and hepatomegaly  . Liver mass 11/26/2014  . Hypertension     NOT CURRENTLY ON ANY BP MED  . Heart murmur   . Arthritis   . Gallstones   . Anxiety    Past Surgical History  Procedure Laterality Date  . Wisdom tooth extraction    . Esophagogastroduodenoscopy (egd) with propofol Left 09/20/2014    Procedure: ESOPHAGOGASTRODUODENOSCOPY (EGD) WITH PROPOFOL;  Surgeon: Arta Silence, MD;  Location: [facility] ENDOSCOPY;  Service: Endoscopy;  Laterality: Left;  . Cholecystectomy N/A 02/11/2015    Procedure: LAPAROSCOPIC CHOLECYSTECTOMY WITH INTRAOPERATIVE CHOLANGIOGRAM;  Surgeon: Greer Pickerel, MD;  Location: [facility] ORS;  Service: General;  Laterality: N/A;  Social History:   reports that she has never smoked. She has never used smokeless tobacco. She reports that she uses illicit drugs (Marijuana). She reports that she does not drink alcohol.    Allergies  Allergen Reactions  . Peanut-Containing Drug Products Swelling    Swelling of mouth, lips  . Strawberry Extract Swelling    Swelling of mouth, lips  . Ultram [Tramadol] Itching    Family History  Problem Relation Age of Onset  . Diabetes Mother   . Hyperlipidemia Mother   . Hypertension Father   . Heart disease Father   . Heart disease Maternal Grandmother   . Heart disease Maternal Grandfather   . Hypertension Paternal Grandmother   . Cancer Paternal Grandfather       Prior to Admission medications   Medication Sig Start Date End Date Taking? Authorizing Provider  albuterol (PROVENTIL HFA;VENTOLIN HFA) 108 (90 BASE) MCG/ACT inhaler Inhale 2 puffs into the lungs every 6 (six) hours as needed for shortness of breath.   Yes Historical Provider, MD  albuterol (PROVENTIL) (2.5 MG/3ML) 0.083% nebulizer solution Take 2.5 mg by nebulization every 6 (six) hours as needed for shortness of breath.   Yes Historical Provider, MD  budesonide-formoterol (SYMBICORT) 160-4.5 MCG/ACT inhaler Inhale 2 puffs into the lungs daily.     Yes Historical Provider, MD  EPINEPHrine (EPIPEN) 0.3 mg/0.3 mL SOAJ injection Inject 0.3 mg into the muscle once. As needed for allergic reaction. Never had to use before   Yes Historical Provider, MD  escitalopram (LEXAPRO) 10 MG tablet Take 5 mg by mouth daily. 06/20/15 06/19/16 Yes Historical Provider, MD  insulin aspart (NOVOLOG FLEXPEN) 100 UNIT/ML FlexPen INJECT 0-20 UNITS INTO THE SKIN AS DIRECTED. SSI - CARB SCALE, 0-20 UNITS Patient taking differently: Inject 0-20 Units into the skin 4 (four) times daily as needed for high blood sugar. Sliding scale 10/19/13  Yes Elayne Snare, MD  Insulin Glargine (LANTUS SOLOSTAR) 100 UNIT/ML Solostar Pen Inject 24 Units into the skin daily at 10 pm. 05/25/15  Yes Domenic Polite, MD  metoCLOPramide (REGLAN) 10 MG tablet Take 1 tablet (10 mg total) by mouth 3 (three) times daily before meals. 06/11/15  Yes Shanker Kristeen Mans, MD  montelukast (SINGULAIR) 10 MG tablet Take 10 mg by mouth daily.   Yes Historical Provider, MD  ondansetron (ZOFRAN) 4 MG tablet Take 1 tablet (4 mg total) by mouth every 6 (six) hours. Patient taking differently: Take 4 mg by mouth every 8 (eight) hours as needed for nausea or vomiting.  04/23/15  Yes [place] Lions, PA-C  oxyCODONE-acetaminophen (PERCOCET/ROXICET) 5-325 MG tablet Take 1 tablet by mouth every 8 (eight) hours as needed for moderate pain or severe pain.   Yes Historical Provider, MD  omeprazole (PRILOSEC) 40 MG capsule Take 1 capsule (40 mg total) by mouth daily. Patient not taking: Reported on 07/01/2015 04/23/15   Orson Eva, MD    Physical Exam: BP 159/92 mmHg  Pulse 139  Temp(Src) 98 F (36.7 C) (Oral)  Resp 24  SpO2 100%  LMP 05/31/2015 (Approximate)  GENERAL :   Alert and cooperative, and appears to be in no acute distress. HEAD:           normocephalic. EYES:            PERRL, EOMI.  vision is grossly intact. EARS:           hearing grossly intact. NOSE:           No nasal discharge.  THROAT:     Oral  cavity and pharynx normal.   NECK:          supple, non-tender.  CARDIAC:    Normal S1 and S2. No gallop. No murmurs.  Vascular:     no peripheral edema.  LUNGS:       Clear to auscultation  ABDOMEN: Positive bowel sounds. Soft, nondistended, diffuse tenderness. No guarding or rebound.      MSK:           No joint erythema or tenderness. Normal muscular development. EXT           : No significant deformity or joint abnormality. Neuro        : Alert, oriented to person, place, and time.                      CN II-XII intact.                      SKIN:            No rash. No lesions. PSYCH:       No hallucination. Patient is not suicidal.          Labs on Admission:  Reviewed.   Radiological Exams on Admission: No results found.   Assessment/Plan  Diabetic Ketoacidosis: No history suggestive of infection,leukocytosis likely reactive.will monitor Given 3L of NS in the ER.will continue with DKA protocol NS@200  cc/hr until CBG<250 then 1/2NS/D5 @150cc  Admit to step down with CBGQ1H Insulin drip per protocol with BMP Q4H until AG is closed Keep NPO until AG is closed.  No K, will monitor.   DMI: on insulin drip, will restart home meds when AG is closed.   Asthma: continue home meds.  Gastroparesis:  NPO Zofran prn Anti H2 IV Morphine prn Monitor Hb  Input & Output: NA Lines & Tubes: PIV DVT prophylaxis: [place] enoxaparin  GI prophylaxis: Zantac  Consultants: NA Code Status: Full  Family Communication: None at bedside  Disposition Plan: Pam Drown M.D [facility]

## 2015-07-01 NOTE — Progress Notes (Signed)
EDCM went to speak to patient at bedside, however, patient being assisted to bathroom

## 2015-07-01 NOTE — ED Notes (Signed)
Spoke with phlebotomy about patient needing blood.

## 2015-07-01 NOTE — Discharge Instructions (Signed)

## 2015-07-01 NOTE — ED Notes (Signed)
Per [facility] pt from home she has hx gastroparesis and was seen here last week for the same. Pt started vomiting at 4 am and reports about 20 episodes. EMS reports seeing her vomit 4 times while in their care. Pt also reports diabetes 1 and CBG was 200. BP 118/88 HR 128, Sats 98% on RA.

## 2015-07-01 NOTE — ED Provider Notes (Signed)
CSN: [id number]     Arrival date & time 07/01/15  1307 History   First MD Initiated Contact with Patient 07/01/15 1424     Chief Complaint  Patient presents with  . Vomiting     (Consider location/radiation/quality/duration/timing/severity/associated sxs/prior Treatment) HPI   25 year old female with abdominal pain and nausea/vomiting. She has a past history of diabetic gastroparesis. She states her current symptoms feel similar to this. Worsening this morning. Not improved with her medications. Says she could not keep anything down. No fevers or chills. No urinary complaints. No diarrhea. No sick contacts.  Past Medical History  Diagnosis Date  . Diabetes mellitus 2007    IDDM.  poorly controlled, multiple admits with DKA  . Asthma   . Pancreatitis, acute 11/26/2014  . Hepatic steatosis 11/26/2014    and hepatomegaly  . Liver mass 11/26/2014  . Hypertension     NOT CURRENTLY ON ANY BP MED  . Heart murmur   . Arthritis   . Gallstones   . Anxiety    Past Surgical History  Procedure Laterality Date  . Wisdom tooth extraction    . Esophagogastroduodenoscopy (egd) with propofol Left 09/20/2014    Procedure: ESOPHAGOGASTRODUODENOSCOPY (EGD) WITH PROPOFOL;  Surgeon: Arta Silence, MD;  Location: [facility] ENDOSCOPY;  Service: Endoscopy;  Laterality: Left;  . Cholecystectomy N/A 02/11/2015    Procedure: LAPAROSCOPIC CHOLECYSTECTOMY WITH INTRAOPERATIVE CHOLANGIOGRAM;  Surgeon: Greer Pickerel, MD;  Location: [facility] ORS;  Service: General;  Laterality: N/A;   Family History  Problem Relation Age of Onset  . Diabetes Mother   . Hyperlipidemia Mother   . Hypertension Father   . Heart disease Father   . Heart disease Maternal Grandmother   . Heart disease Maternal Grandfather   . Hypertension Paternal Grandmother   . Cancer Paternal Grandfather    Social History  Substance Use Topics  . Smoking status: Never Smoker   . Smokeless tobacco: Never Used  . Alcohol Use: No   OB History    Gravida  Para Term Preterm AB TAB SAB Ectopic Multiple Living   2 1 0 1 1 1 0 0 0 1      Review of Systems  All systems reviewed and negative, other than as noted in HPI.   Allergies  Peanut-containing drug products; Strawberry extract; and Ultram  Home Medications   Prior to Admission medications   Medication Sig Start Date End Date Taking? Authorizing Provider  albuterol (PROVENTIL HFA;VENTOLIN HFA) 108 (90 BASE) MCG/ACT inhaler Inhale 2 puffs into the lungs every 6 (six) hours as needed for shortness of breath.    Historical Provider, MD  albuterol (PROVENTIL) (2.5 MG/3ML) 0.083% nebulizer solution Take 2.5 mg by nebulization every 6 (six) hours as needed for shortness of breath.    Historical Provider, MD  budesonide-formoterol (SYMBICORT) 160-4.5 MCG/ACT inhaler Inhale 2 puffs into the lungs daily.     Historical Provider, MD  EPINEPHrine (EPIPEN) 0.3 mg/0.3 mL SOAJ injection Inject 0.3 mg into the muscle once. As needed for allergic reaction. Never had to use before    Historical Provider, MD  escitalopram (LEXAPRO) 10 MG tablet Take 5 mg by mouth daily. 06/20/15 06/19/16  Historical Provider, MD  insulin aspart (NOVOLOG FLEXPEN) 100 UNIT/ML FlexPen INJECT 0-20 UNITS INTO THE SKIN AS DIRECTED. SSI - CARB SCALE, 0-20 UNITS Patient taking differently: Inject 0-20 Units into the skin 4 (four) times daily as needed for high blood sugar. Sliding scale 10/19/13   Elayne Snare, MD  Insulin Glargine (LANTUS SOLOSTAR) 100  UNIT/ML Solostar Pen Inject 24 Units into the skin daily at 10 pm. 05/25/15   Domenic Polite, MD  metoCLOPramide (REGLAN) 10 MG tablet Take 1 tablet (10 mg total) by mouth 3 (three) times daily before meals. 06/11/15   Shanker Kristeen Mans, MD  montelukast (SINGULAIR) 10 MG tablet Take 10 mg by mouth daily.    Historical Provider, MD  omeprazole (PRILOSEC) 40 MG capsule Take 1 capsule (40 mg total) by mouth daily. 04/23/15   Orson Eva, MD  ondansetron (ZOFRAN) 4 MG tablet Take 1 tablet (4 mg total)  by mouth every 6 (six) hours. Patient taking differently: Take 4 mg by mouth every 8 (eight) hours as needed for nausea or vomiting.  04/23/15   [place] Lions, PA-C   BP 121/77 mmHg  Pulse 118  Temp(Src) 98.2 F (36.8 C) (Oral)  Resp 18  SpO2 100%  LMP 05/31/2015 (Approximate) Physical Exam  Constitutional: She appears well-developed.  Laying on right side. Appears uncomfortable.  HENT:  Head: Normocephalic and atraumatic.  Eyes: Conjunctivae are normal. Right eye exhibits no discharge. Left eye exhibits no discharge.  Neck: Neck supple.  Cardiovascular: Regular rhythm and normal heart sounds.  Exam reveals no gallop and no friction rub.   No murmur heard. Mildly tachycardic  Pulmonary/Chest: Effort normal and breath sounds normal. No respiratory distress.  Abdominal: Soft. She exhibits no distension. There is tenderness.  Mild, diffuse abdominal tenderness. No guarding. No rebound or distention.  Musculoskeletal: She exhibits no edema or tenderness.  Neurological: She is alert.  Skin: Skin is warm and dry.  Psychiatric: She has a normal mood and affect. Her behavior is normal. Thought content normal.  Nursing note and vitals reviewed.   ED Course  Procedures (including critical care time) Labs Review Labs Reviewed  CBC WITH DIFFERENTIAL/PLATELET  BASIC METABOLIC PANEL  URINALYSIS, ROUTINE W REFLEX MICROSCOPIC (NOT AT [facility])    Imaging Review No results found. I have personally reviewed and evaluated these images and lab results as part of my medical decision-making.   EKG Interpretation None      MDM   Final diagnoses:  Non-intractable vomiting with nausea, vomiting of unspecified type  Abdominal pain, unspecified abdominal location  Diabetic ketoacidosis without coma associated with type 1 diabetes mellitus ([place])    5:10 PM Patient states that she currently feels much better. She requested some Diet Coke which she tolerated. At this time labs are still  pending. Assuming that they do not show anything significantly abnormal that needs to be acutely addressed, I feel she can be discharged. Primary concern would be to r/o DKA particularly with her history.   Virgel Manifold, MD 07/12/15 2131

## 2015-07-01 NOTE — Progress Notes (Addendum)
Pt with [facility] 7 ED visits and 8 admissions for emesis, uncontrolled DM, gastroparesis   Last d/c on 06/24/15 home  pcp listed as Vicenta Aly  No ED CP

## 2015-07-01 NOTE — ED Notes (Signed)
Pt presents to ED with symptoms consistent with past episodes of gastroparesis.

## 2015-07-01 NOTE — ED Notes (Signed)
ICU is going to call when primary nurse is ready for patient.

## 2015-07-02 DIAGNOSIS — K3184 Gastroparesis: Secondary | ICD-10-CM

## 2015-07-02 DIAGNOSIS — R1110 Vomiting, unspecified: Secondary | ICD-10-CM

## 2015-07-02 DIAGNOSIS — E1311 Other specified diabetes mellitus with ketoacidosis with coma: Secondary | ICD-10-CM

## 2015-07-02 LAB — GLUCOSE, CAPILLARY
GLUCOSE-CAPILLARY: 174 mg/dL — AB (ref 65–99)
GLUCOSE-CAPILLARY: 141 mg/dL — AB (ref 65–99)
GLUCOSE-CAPILLARY: 184 mg/dL — AB (ref 65–99)
GLUCOSE-CAPILLARY: 127 mg/dL — AB (ref 65–99)
GLUCOSE-CAPILLARY: 129 mg/dL — AB (ref 65–99)
GLUCOSE-CAPILLARY: 136 mg/dL — AB (ref 65–99)
GLUCOSE-CAPILLARY: 199 mg/dL — AB (ref 65–99)
GLUCOSE-CAPILLARY: 181 mg/dL — AB (ref 65–99)
GLUCOSE-CAPILLARY: 127 mg/dL — AB (ref 65–99)
GLUCOSE-CAPILLARY: 225 mg/dL — AB (ref 65–99)
GLUCOSE-CAPILLARY: 162 mg/dL — AB (ref 65–99)
Glucose-Capillary: 120 mg/dL — ABNORMAL HIGH (ref 65–99)
Glucose-Capillary: 135 mg/dL — ABNORMAL HIGH (ref 65–99)
Glucose-Capillary: 140 mg/dL — ABNORMAL HIGH (ref 65–99)
Glucose-Capillary: 157 mg/dL — ABNORMAL HIGH (ref 65–99)
Glucose-Capillary: 157 mg/dL — ABNORMAL HIGH (ref 65–99)
Glucose-Capillary: 167 mg/dL — ABNORMAL HIGH (ref 65–99)
Glucose-Capillary: 174 mg/dL — ABNORMAL HIGH (ref 65–99)
Glucose-Capillary: 184 mg/dL — ABNORMAL HIGH (ref 65–99)
Glucose-Capillary: 196 mg/dL — ABNORMAL HIGH (ref 65–99)
Glucose-Capillary: 205 mg/dL — ABNORMAL HIGH (ref 65–99)
Glucose-Capillary: 207 mg/dL — ABNORMAL HIGH (ref 65–99)

## 2015-07-02 LAB — MRSA PCR SCREENING: MRSA by PCR: NEGATIVE

## 2015-07-02 LAB — BASIC METABOLIC PANEL
ANION GAP: 8 (ref 5–15)
ANION GAP: 11 (ref 5–15)
ANION GAP: 8 (ref 5–15)
ANION GAP: 9 (ref 5–15)
ANION GAP: 9 (ref 5–15)
Anion gap: 17 — ABNORMAL HIGH (ref 5–15)
BUN: 10 mg/dL (ref 6–20)
BUN: 10 mg/dL (ref 6–20)
BUN: 10 mg/dL (ref 6–20)
BUN: 9 mg/dL (ref 6–20)
BUN: 8 mg/dL (ref 6–20)
BUN: 15 mg/dL (ref 6–20)
CALCIUM: 8.7 mg/dL — AB (ref 8.9–10.3)
CALCIUM: 8.5 mg/dL — AB (ref 8.9–10.3)
CALCIUM: 8.4 mg/dL — AB (ref 8.9–10.3)
CALCIUM: 8.6 mg/dL — AB (ref 8.9–10.3)
CHLORIDE: 116 mmol/L — AB (ref 101–111)
CHLORIDE: 119 mmol/L — AB (ref 101–111)
CO2: 10 mmol/L — AB (ref 22–32)
CO2: 10 mmol/L — ABNORMAL LOW (ref 22–32)
CO2: 14 mmol/L — AB (ref 22–32)
CO2: 14 mmol/L — AB (ref 22–32)
CO2: 15 mmol/L — ABNORMAL LOW (ref 22–32)
CO2: 8 mmol/L — ABNORMAL LOW (ref 22–32)
CREATININE: 0.54 mg/dL (ref 0.44–1.00)
CREATININE: 0.6 mg/dL (ref 0.44–1.00)
CREATININE: 0.52 mg/dL (ref 0.44–1.00)
Calcium: 8.3 mg/dL — ABNORMAL LOW (ref 8.9–10.3)
Calcium: 8.6 mg/dL — ABNORMAL LOW (ref 8.9–10.3)
Chloride: 116 mmol/L — ABNORMAL HIGH (ref 101–111)
Chloride: 115 mmol/L — ABNORMAL HIGH (ref 101–111)
Chloride: 113 mmol/L — ABNORMAL HIGH (ref 101–111)
Chloride: 113 mmol/L — ABNORMAL HIGH (ref 101–111)
Creatinine, Ser: 0.61 mg/dL (ref 0.44–1.00)
Creatinine, Ser: 0.64 mg/dL (ref 0.44–1.00)
Creatinine, Ser: 0.88 mg/dL (ref 0.44–1.00)
GFR calc non Af Amer: >60 mL/min (ref >60)
GFR calc non Af Amer: >60 mL/min (ref >60)
GFR calc non Af Amer: >60 mL/min (ref >60)
Glucose, Bld: 135 mg/dL — ABNORMAL HIGH (ref 65–99)
Glucose, Bld: 157 mg/dL — ABNORMAL HIGH (ref 65–99)
Glucose, Bld: 165 mg/dL — ABNORMAL HIGH (ref 65–99)
Glucose, Bld: 197 mg/dL — ABNORMAL HIGH (ref 65–99)
Glucose, Bld: 215 mg/dL — ABNORMAL HIGH (ref 65–99)
Glucose, Bld: 282 mg/dL — ABNORMAL HIGH (ref 65–99)
POTASSIUM: 4 mmol/L (ref 3.5–5.1)
POTASSIUM: 5 mmol/L (ref 3.5–5.1)
Potassium: 3.8 mmol/L (ref 3.5–5.1)
Potassium: 3.6 mmol/L (ref 3.5–5.1)
Potassium: 3.5 mmol/L (ref 3.5–5.1)
Potassium: 3.7 mmol/L (ref 3.5–5.1)
SODIUM: 136 mmol/L (ref 135–145)
SODIUM: 137 mmol/L (ref 135–145)
SODIUM: 137 mmol/L (ref 135–145)
SODIUM: 137 mmol/L (ref 135–145)
SODIUM: 137 mmol/L (ref 135–145)
SODIUM: 141 mmol/L (ref 135–145)

## 2015-07-02 MED ORDER — PROMETHAZINE HCL 25 MG/ML IJ SOLN
12.5000 mg | Freq: Four times a day (QID) | INTRAMUSCULAR | Status: DC | PRN
  Administered 2015-07-02 – 2015-07-05 (×9): 12.5 mg via INTRAVENOUS
  Filled 2015-07-02 (×9): qty 1

## 2015-07-02 MED ORDER — POTASSIUM CHLORIDE 10 MEQ/100ML IV SOLN
10.0000 meq | INTRAVENOUS | Status: AC
  Administered 2015-07-03 (×4): 10 meq via INTRAVENOUS
  Filled 2015-07-02 (×3): qty 100

## 2015-07-02 MED ORDER — METOCLOPRAMIDE HCL 5 MG/ML IJ SOLN
5.0000 mg | Freq: Four times a day (QID) | INTRAMUSCULAR | Status: DC | PRN
  Administered 2015-07-02: 5 mg via INTRAVENOUS
  Filled 2015-07-02: qty 2

## 2015-07-02 MED ORDER — PROMETHAZINE HCL 25 MG/ML IJ SOLN
12.5000 mg | Freq: Once | INTRAMUSCULAR | Status: AC
  Administered 2015-07-02: 12.5 mg via INTRAVENOUS
  Filled 2015-07-02: qty 1

## 2015-07-02 MED ORDER — SODIUM CHLORIDE 0.9 % IV BOLUS (SEPSIS)
1000.0000 mL | Freq: Once | INTRAVENOUS | Status: AC
  Administered 2015-07-03: 1000 mL via INTRAVENOUS

## 2015-07-02 MED ORDER — SODIUM CHLORIDE 0.9 % IV BOLUS (SEPSIS)
1000.0000 mL | Freq: Once | INTRAVENOUS | Status: AC
  Administered 2015-07-02: 1000 mL via INTRAVENOUS

## 2015-07-02 MED ORDER — METOCLOPRAMIDE HCL 5 MG/ML IJ SOLN
10.0000 mg | Freq: Three times a day (TID) | INTRAMUSCULAR | Status: DC
  Administered 2015-07-02 – 2015-07-06 (×12): 10 mg via INTRAVENOUS
  Filled 2015-07-02 (×13): qty 2

## 2015-07-02 NOTE — Progress Notes (Signed)
PROGRESS NOTE    [name]  [id number] DOB: [birth year] DOA: 07/01/2015 PCP: Vicenta Aly, FNP   Brief Narrative:  [name] is a 25 year old female with a history of type 1 diabetes mellitus, having previous hospitalizations for diabetic ketoacidosis who was admitted overnight to the medicine service when she presented with intractable nausea and vomiting associate with abdominal pain. Initial lab work revealed diabetic ketoacidosis with an anion gap of 17. He was started on the DKA protocol, given IV fluids and IV insulin admitted to the stepdown unit.   Assessment & Plan:   Principal Problem:   DKA (diabetic ketoacidoses) ([place]) Active Problems:   Uncontrolled type 1 diabetes mellitus ([facility])   Nausea and vomiting  1.  Diabetic ketoacidosis -[name] is a 25 year old female having a history of type 1 diabetes mellitus having previous hospitalizations for diabetic ketoacidosis. -She presents with intractable nausea and vomiting, lab work showed anion gap of 17 -She was started on the DKA protocol treated with IV insulin and IV fluids with her anion gap closing overnight -This morning she continues to have repeated episodes of nausea and vomiting and unlikely to tolerate by mouth intake. -Plan to continue D5 half-normal saline with IV insulin, continue monitoring sugars  2.  Probable diabetic gastroparesis flare-up -Patient having a history of diabetic gastroparesis, having intractable nausea and vomiting -She is unable to tolerate by mouth intake at this time, will continue IV insulin for now with close monitoring of blood sugars. -Plan to schedule Reglan 10 mg IV 3 times a day -Provide IV fluids and supportive care  3.  Leukocytosis. -Lab work showing a white count of 18,600 -I suspect secondary to diabetic ketoacidosis -Workup has not revealed obvious source of infection and she has remained afebrile overnight  4.  History of asthma -Stable, she does not  appear to have acute respiratory issues at this time  DVT prophylaxis: Lovenox Code Status: Full code Family Communication: I spoke with her mother over telephone, updated her on patient's condition Disposition Plan: Plan to continue IV insulin, remains in step down unit   Subjective: Still having nausea vomiting, feels ill  Objective: Filed Vitals:   07/02/15 0300 07/02/15 0400 07/02/15 0500 07/02/15 0600  BP:  128/76  145/90  Pulse: 131 130 128 128  Temp:  99 F (37.2 C)    TempSrc:  Oral    Resp: 19 21 17 22   Height:      Weight:      SpO2: 100% 100% 100% 100%    Intake/Output Summary (Last 24 hours) at 07/02/15 0756 Last data filed at 07/02/15 0600  Gross per 24 hour  Intake 1037.58 ml  Output      0 ml  Net 1037.58 ml   Filed Weights   07/02/15 0000  Weight: 48.3 kg (106 lb 7.7 oz)    Examination:  General exam: Ill-appearing Respiratory system: Clear to auscultation. Respiratory effort normal. Cardiovascular system: S1 & S2 heard, RRR. No JVD, murmurs, rubs, gallops or clicks. No pedal edema. Gastrointestinal system: There is generalized tenderness to palpation, no palpable masses, rebound tenderness Central nervous system: Alert and oriented. No focal neurological deficits. Extremities: Symmetric 5 x 5 power. Skin: No rashes, lesions or ulcers Psychiatry: Judgement and insight appear normal. Mood & affect appropriate.     Data Reviewed: I have personally reviewed following labs and imaging studies  CBC:  Recent Labs Lab 07/01/15 1400 07/01/15 2030  WBC 8.6 18.6*  NEUTROABS 6.3 14.8*  HGB 15.2* 13.7  HCT 45.0 42.0  MCV 91.3 95.2  PLT 581* [id number]*   Basic Metabolic Panel:  Recent Labs Lab 07/01/15 1400 07/01/15 2030 07/02/15 0014 07/02/15 0412 07/02/15 0610  NA 136 136 141 137 137  K 4.1 5.4* 5.0 4.0 3.8  CL 101 109 116* 119* 116*  CO2 18* 7* 8* 10* 10*  GLUCOSE 253* 492* 282* 157* 165*  BUN 15 15 15 10 10   CREATININE 0.66 1.00 0.88  0.61 0.64  CALCIUM 9.8 8.6* 8.6* 8.3* 8.7*   GFR: Estimated Creatinine Clearance: 82 mL/min (by C-G formula based on Cr of 0.64). Liver Function Tests: No results for input(s): AST, ALT, ALKPHOS, BILITOT, PROT, ALBUMIN in the last 168 hours. No results for input(s): LIPASE, AMYLASE in the last 168 hours. No results for input(s): AMMONIA in the last 168 hours. Coagulation Profile: No results for input(s): INR, PROTIME in the last 168 hours. Cardiac Enzymes: No results for input(s): CKTOTAL, CKMB, CKMBINDEX, TROPONINI in the last 168 hours. BNP (last 3 results) No results for input(s): PROBNP in the last 8760 hours. HbA1C: No results for input(s): HGBA1C in the last 72 hours. CBG:  Recent Labs Lab 07/02/15 0300 07/02/15 0357 07/02/15 0500 07/02/15 0559 07/02/15 0700  GLUCAP 174* 141* 184* 167* 127*   Lipid Profile: No results for input(s): CHOL, HDL, LDLCALC, TRIG, CHOLHDL, LDLDIRECT in the last 72 hours. Thyroid Function Tests: No results for input(s): TSH, T4TOTAL, FREET4, T3FREE, THYROIDAB in the last 72 hours. Anemia Panel: No results for input(s): VITAMINB12, FOLATE, FERRITIN, TIBC, IRON, RETICCTPCT in the last 72 hours. Sepsis Labs: No results for input(s): PROCALCITON, LATICACIDVEN in the last 168 hours.  Recent Results (from the past 240 hour(s))  MRSA PCR Screening     Status: None   Collection Time: 07/01/15 11:59 PM  Result Value Ref Range Status   MRSA by PCR NEGATIVE NEGATIVE Final    Comment:        The GeneXpert MRSA Assay (FDA approved for NASAL specimens only), is one component of a comprehensive MRSA colonization surveillance program. It is not intended to diagnose MRSA infection nor to guide or monitor treatment for MRSA infections.          Radiology Studies: No results found.      Scheduled Meds: . enoxaparin (LOVENOX) injection  40 mg Subcutaneous QHS  . escitalopram  5 mg Oral Daily  . famotidine (PEPCID) IV  20 mg Intravenous  Q12H  . metoCLOPramide (REGLAN) injection  10 mg Intravenous Q8H  . mometasone-formoterol  2 puff Inhalation BID  . montelukast  10 mg Oral Daily   Continuous Infusions: . sodium chloride Stopped (07/02/15 0010)  . dextrose 5 % and 0.45% NaCl 150 mL/hr at 07/02/15 0734  . insulin (NOVOLIN-R) infusion 1.3 Units/hr (07/02/15 0701)     LOS: 1 day    Time spent: 35 minutes    Kelvin Cellar, MD [facility] Pager [phone]  If 7PM-7AM, please contact night-coverage [email or web address] Password [facility] 07/02/2015, 7:56 AM

## 2015-07-02 NOTE — Plan of Care (Signed)
Problem: Fluid Volume: Goal: Ability to maintain a balanced intake and output will improve Outcome: Not Met (add Reason) Pt NPO at this time  Problem: Nutrition: Goal: Adequate nutrition will be maintained Outcome: Not Met (add Reason) Pt NPO at this time

## 2015-07-02 NOTE — Progress Notes (Signed)
Initial Nutrition Assessment  DOCUMENTATION CODES:   Underweight  INTERVENTION:  - Diet advancement as medically feasible. - RD will order nutrition supplements and/or snacks with diet advancement.  NUTRITION DIAGNOSIS:   Inadequate oral intake related to inability to eat as evidenced by NPO status.  GOAL:   Patient will meet greater than or equal to 90% of their needs  MONITOR:   Diet advancement, Weight trends, Labs, I & O's  REASON FOR ASSESSMENT:   Other (Comment) (Underweight BMI)  ASSESSMENT:   25 year old female with history of DMI, asthma and gastroparesis who came today with cc of N/V. She saw some blood in her vomitus after repeated vomiting but no coffee ground hematemesis. She has diffuse abdominal pain without diarrhea. No fever or chills. No chest pain,dyspnea or cough.she has headache.   Pt seen for low BMI. Pt has been NPO since admission and unable to meet nutrition needs. Pt sleeping at time of visit with no family/visitors present; did not want to awake pt at this time so that she could comfortably rest. Spoke with RN who states that pt was not feeling well throughout the morning and was having ongoing nausea with dry heaving but no emesis. Documented hx of type 1 DM with gastroparesis and admission for DKA.  Physical assessment will be completed at follow-up with findings documented accordingly. Pt seen by RD 06/24/15 with documented mild/moderate muscle and mild/moderate fat wasting to all areas. Per chart review, pt has lost 9 lbs (7.8% body weight) in the past 1 month which is significant for time frame. Pt meets criteria for malnutrition but will document on severity at follow-up.   Medications reviewed. IVF: D5-1/2 NS @ 150 mL/hr (612 kcal). Labs reviewed; CBGs: 120-207 mg/dL, Cl: 116 mmol/L, Ca: 8.7 mg/dL.   Diet Order:  Diet NPO time specified  Skin:  Reviewed, no issues  Last BM:  6/13  Height:   Ht Readings from Last 1 Encounters:  07/02/15 5'  4" (1.626 m)    Weight:   Wt Readings from Last 1 Encounters:  07/02/15 106 lb 7.7 oz (48.3 kg)    Ideal Body Weight:  54.54 kg (kg)  BMI:  Body mass index is 18.27 kg/(m^2).  Estimated Nutritional Needs:   Kcal:  1450-1650  Protein:  50-60 grams  Fluid:  >/= 1.6 L/day  EDUCATION NEEDS:   No education needs identified at this time     Jarome Matin, MS, RD, LDN Inpatient Clinical Dietitian Pager # [phone] After hours/weekend pager # [phone]

## 2015-07-03 DIAGNOSIS — E0810 Diabetes mellitus due to underlying condition with ketoacidosis without coma: Secondary | ICD-10-CM

## 2015-07-03 DIAGNOSIS — E876 Hypokalemia: Secondary | ICD-10-CM

## 2015-07-03 LAB — BASIC METABOLIC PANEL
ANION GAP: 9 (ref 5–15)
ANION GAP: 10 (ref 5–15)
ANION GAP: 9 (ref 5–15)
Anion gap: 7 (ref 5–15)
Anion gap: 10 (ref 5–15)
Anion gap: 10 (ref 5–15)
BUN: 7 mg/dL (ref 6–20)
BUN: <5 mg/dL — ABNORMAL LOW (ref 6–20)
BUN: <5 mg/dL — ABNORMAL LOW (ref 6–20)
BUN: <5 mg/dL — ABNORMAL LOW (ref 6–20)
BUN: <5 mg/dL — ABNORMAL LOW (ref 6–20)
CALCIUM: 8.5 mg/dL — AB (ref 8.9–10.3)
CALCIUM: 7.9 mg/dL — AB (ref 8.9–10.3)
CALCIUM: 8.1 mg/dL — AB (ref 8.9–10.3)
CALCIUM: 8.2 mg/dL — AB (ref 8.9–10.3)
CHLORIDE: 109 mmol/L (ref 101–111)
CHLORIDE: 109 mmol/L (ref 101–111)
CHLORIDE: 109 mmol/L (ref 101–111)
CO2: 15 mmol/L — AB (ref 22–32)
CO2: 16 mmol/L — AB (ref 22–32)
CO2: 14 mmol/L — ABNORMAL LOW (ref 22–32)
CO2: 18 mmol/L — ABNORMAL LOW (ref 22–32)
CO2: 19 mmol/L — ABNORMAL LOW (ref 22–32)
CO2: 18 mmol/L — ABNORMAL LOW (ref 22–32)
CREATININE: 0.46 mg/dL (ref 0.44–1.00)
CREATININE: 0.57 mg/dL (ref 0.44–1.00)
CREATININE: 0.59 mg/dL (ref 0.44–1.00)
CREATININE: 0.38 mg/dL — AB (ref 0.44–1.00)
CREATININE: 0.45 mg/dL (ref 0.44–1.00)
CREATININE: 0.43 mg/dL — AB (ref 0.44–1.00)
Calcium: 8.3 mg/dL — ABNORMAL LOW (ref 8.9–10.3)
Calcium: 8.3 mg/dL — ABNORMAL LOW (ref 8.9–10.3)
Chloride: 114 mmol/L — ABNORMAL HIGH (ref 101–111)
Chloride: 115 mmol/L — ABNORMAL HIGH (ref 101–111)
Chloride: 113 mmol/L — ABNORMAL HIGH (ref 101–111)
GFR calc non Af Amer: >60 mL/min (ref >60)
Glucose, Bld: 163 mg/dL — ABNORMAL HIGH (ref 65–99)
Glucose, Bld: 142 mg/dL — ABNORMAL HIGH (ref 65–99)
Glucose, Bld: 163 mg/dL — ABNORMAL HIGH (ref 65–99)
Glucose, Bld: 174 mg/dL — ABNORMAL HIGH (ref 65–99)
Glucose, Bld: 173 mg/dL — ABNORMAL HIGH (ref 65–99)
Glucose, Bld: 180 mg/dL — ABNORMAL HIGH (ref 65–99)
POTASSIUM: 3.4 mmol/L — AB (ref 3.5–5.1)
Potassium: 3 mmol/L — ABNORMAL LOW (ref 3.5–5.1)
Potassium: 3 mmol/L — ABNORMAL LOW (ref 3.5–5.1)
Potassium: 3.2 mmol/L — ABNORMAL LOW (ref 3.5–5.1)
Potassium: 3.3 mmol/L — ABNORMAL LOW (ref 3.5–5.1)
Potassium: 3 mmol/L — ABNORMAL LOW (ref 3.5–5.1)
SODIUM: 138 mmol/L (ref 135–145)
SODIUM: 137 mmol/L (ref 135–145)
SODIUM: 136 mmol/L (ref 135–145)
SODIUM: 138 mmol/L (ref 135–145)
SODIUM: 137 mmol/L (ref 135–145)
Sodium: 138 mmol/L (ref 135–145)

## 2015-07-03 LAB — GLUCOSE, CAPILLARY
GLUCOSE-CAPILLARY: 115 mg/dL — AB (ref 65–99)
GLUCOSE-CAPILLARY: 137 mg/dL — AB (ref 65–99)
GLUCOSE-CAPILLARY: 148 mg/dL — AB (ref 65–99)
GLUCOSE-CAPILLARY: 149 mg/dL — AB (ref 65–99)
GLUCOSE-CAPILLARY: 152 mg/dL — AB (ref 65–99)
GLUCOSE-CAPILLARY: 156 mg/dL — AB (ref 65–99)
GLUCOSE-CAPILLARY: 163 mg/dL — AB (ref 65–99)
GLUCOSE-CAPILLARY: 164 mg/dL — AB (ref 65–99)
GLUCOSE-CAPILLARY: 167 mg/dL — AB (ref 65–99)
GLUCOSE-CAPILLARY: 168 mg/dL — AB (ref 65–99)
GLUCOSE-CAPILLARY: 169 mg/dL — AB (ref 65–99)
GLUCOSE-CAPILLARY: 180 mg/dL — AB (ref 65–99)
GLUCOSE-CAPILLARY: 184 mg/dL — AB (ref 65–99)
GLUCOSE-CAPILLARY: 269 mg/dL — AB (ref 65–99)
Glucose-Capillary: 133 mg/dL — ABNORMAL HIGH (ref 65–99)
Glucose-Capillary: 159 mg/dL — ABNORMAL HIGH (ref 65–99)
Glucose-Capillary: 210 mg/dL — ABNORMAL HIGH (ref 65–99)
Glucose-Capillary: 206 mg/dL — ABNORMAL HIGH (ref 65–99)
Glucose-Capillary: 139 mg/dL — ABNORMAL HIGH (ref 65–99)
Glucose-Capillary: 147 mg/dL — ABNORMAL HIGH (ref 65–99)
Glucose-Capillary: 147 mg/dL — ABNORMAL HIGH (ref 65–99)
Glucose-Capillary: 186 mg/dL — ABNORMAL HIGH (ref 65–99)
Glucose-Capillary: 170 mg/dL — ABNORMAL HIGH (ref 65–99)
Glucose-Capillary: 178 mg/dL — ABNORMAL HIGH (ref 65–99)

## 2015-07-03 LAB — CBC
HCT: 36 % (ref 36.0–46.0)
Hemoglobin: 12.1 g/dL (ref 12.0–15.0)
MCH: 30.2 pg (ref 26.0–34.0)
MCHC: 33.6 g/dL (ref 30.0–36.0)
MCV: 89.8 fL (ref 78.0–100.0)
PLATELETS: 408 10*3/uL — AB (ref 150–400)
RBC: 4.01 MIL/uL (ref 3.87–5.11)
RDW: 13.7 % (ref 11.5–15.5)
WBC: 9.7 10*3/uL (ref 4.0–10.5)

## 2015-07-03 MED ORDER — POTASSIUM CHLORIDE 10 MEQ/100ML IV SOLN
10.0000 meq | INTRAVENOUS | Status: AC
  Administered 2015-07-03 – 2015-07-04 (×4): 10 meq via INTRAVENOUS
  Filled 2015-07-03 (×4): qty 100

## 2015-07-03 MED ORDER — SODIUM CHLORIDE 0.9 % IV BOLUS (SEPSIS)
1000.0000 mL | Freq: Once | INTRAVENOUS | Status: AC
  Administered 2015-07-03: 1000 mL via INTRAVENOUS

## 2015-07-03 MED ORDER — SODIUM CHLORIDE 0.9 % IV BOLUS (SEPSIS)
500.0000 mL | Freq: Once | INTRAVENOUS | Status: AC
  Administered 2015-07-03: 500 mL via INTRAVENOUS

## 2015-07-03 MED ORDER — POTASSIUM CHLORIDE 10 MEQ/100ML IV SOLN
10.0000 meq | INTRAVENOUS | Status: AC
  Administered 2015-07-03 (×2): 10 meq via INTRAVENOUS
  Filled 2015-07-03 (×2): qty 100

## 2015-07-03 MED ORDER — POTASSIUM CHLORIDE 10 MEQ/100ML IV SOLN
10.0000 meq | INTRAVENOUS | Status: AC
  Administered 2015-07-03 (×2): 10 meq via INTRAVENOUS
  Filled 2015-07-03 (×3): qty 100

## 2015-07-03 NOTE — Progress Notes (Signed)
NUTRITION NOTE  Pt seen for full assessment 6/13. Pt asleep again this AM with no family/visitors present. NGT now in place (placed this AM) to suction with no drainage present in canister at this time. Emesis basin resting next to pt with a few wet spots on paper towels in basin but no overt emesis present.   Will complete physical assessment at follow-up rather than today d/t respect for pt's comfort. Able to visualize muscle and fat wasting but will document degree of wasting, degree of malnutrition at follow-up. As documented in note yesterday, pt has lost 9 lbs (7.8% body weight) in the past 1 month which is significant for time frame.   Pt continues to be NPO and unable to meet nutrition needs. Medications reviewed; 10 mg Reglan every 8 hours, 4 mg IV Zofran every 6 hours PRN, 10 mEq IV KCl x4 runs today, 12.5 mg IV Phenergan every 6 hours PRN, insulin drip.  IVF: D5-1/2 NS @ 150 mL/hr (612 kcal).  Labs reviewed; CBGs: 133-210 mg/dL, K: 3.2 mmol/L, Ca: 8.1 mg/dL, Cl: 113 mmol/L, BUN <5 mg/dL.  RD will follow-up 6/16.  Jarome Matin, MS, RD, LDN Inpatient Clinical Dietitian Pager # [phone] After hours/weekend pager # [phone]

## 2015-07-03 NOTE — Discharge Summary (Signed)
Physician Discharge Summary  [name] [id number] DOB: 1990-11-23 DOA: 06/22/2015  PCP: Vicenta Aly, FNP  Admit date: 06/22/2015 Discharge date: 06/24/2015  Time spent: 35 minutes  Recommendations for Outpatient Follow-up:  1. PCP Vicenta Aly in 1 week 2. Please schedule gastric emptying study when stable as outpatient   Discharge Diagnoses:  Principal Problem:   DKA, type 1 ([place]) Active Problems:   Gastroparesis   Altered mental status   Uncontrolled type 1 diabetes mellitus with hyperglycemia ([place])   Discharge Condition:stable  Diet recommendation: diabetic, small portion sizes  Filed Weights   06/22/15 0252  Weight: 46.267 kg (102 lb)    History of present illness:  [name] is a 25 y.o. female with medical history significant of DM1, HTN, recurrent visits and admits for diabetic gastroparesis. Patient presented to the ED with c/o N/V onset at 2am on 6/3. Symptoms unchanged since onset. She reports not taking her lantus day before admit  Hospital Course:   DKA, type 1 -mild -corrected with IVf and Insulin, missed lantus dose Prior to admission -increase lantus back to home dose QHS and give extra this am x1  Nausea and vomiting/Abdominal pain -likely gastroparesis related to her DM  -04/21/2015 CT abdomen and pelvis--stable hepatic masses, normal pancreas, normal bowel wall thickening or inflammation, normal spleen and pancreas -09/23/2014 EGD--diffuse gastritis without clear explanation of the patient's hematemesis -Continue IV fluid, metoclopramide -avoid narcotics -needs gastric emptying scan as outpatient -continue PPI  DM type I, uncontrolled -last Hbaic was 9.3 -as above  Asthma -stable, nebs PRN  Severe protein calorie malnutrition -supplement diet as tol  Discharge Exam: Filed Vitals:   06/24/15 0536 06/24/15 1015  BP: 124/83 133/90  Pulse: 100 100  Temp: 98.1 F (36.7 C) 98.1 F (36.7 C)  Resp: 14 16     General: AAox3 Cardiovascular:S1S2/RRR Respiratory: CTAB  Discharge Instructions   Discharge Instructions    Diet Carb Modified    Complete by:  As directed      Increase activity slowly    Complete by:  As directed           Discharge Medication List as of 06/24/2015 12:08 PM    CONTINUE these medications which have NOT CHANGED   Details  albuterol (PROVENTIL HFA;VENTOLIN HFA) 108 (90 BASE) MCG/ACT inhaler Inhale 2 puffs into the lungs every 6 (six) hours as needed for shortness of breath., Until Discontinued, Historical Med    albuterol (PROVENTIL) (2.5 MG/3ML) 0.083% nebulizer solution Take 2.5 mg by nebulization every 6 (six) hours as needed for shortness of breath., Until Discontinued, Historical Med    budesonide-formoterol (SYMBICORT) 160-4.5 MCG/ACT inhaler Inhale 2 puffs into the lungs daily. , Until Discontinued, Historical Med    EPINEPHrine (EPIPEN) 0.3 mg/0.3 mL SOAJ injection Inject 0.3 mg into the muscle once. As needed for allergic reaction. Never had to use before, Historical Med    escitalopram (LEXAPRO) 10 MG tablet Take 5 mg by mouth daily., Starting 06/20/2015, Until Fri 06/19/16, Historical Med    insulin aspart (NOVOLOG FLEXPEN) 100 UNIT/ML FlexPen INJECT 0-20 UNITS INTO THE SKIN AS DIRECTED. SSI - CARB SCALE, 0-20 UNITS, Normal    Insulin Glargine (LANTUS SOLOSTAR) 100 UNIT/ML Solostar Pen Inject 24 Units into the skin daily at 10 pm., Starting 05/25/2015, Until Discontinued, No Print    metoCLOPramide (REGLAN) 10 MG tablet Take 1 tablet (10 mg total) by mouth 3 (three) times daily before meals., Starting 06/11/2015, Until Discontinued, Print    montelukast (SINGULAIR) 10 MG  tablet Take 10 mg by mouth daily., Until Discontinued, Historical Med    omeprazole (PRILOSEC) 40 MG capsule Take 1 capsule (40 mg total) by mouth daily., Starting 04/23/2015, Until Discontinued, Normal    ondansetron (ZOFRAN) 4 MG tablet Take 1 tablet (4 mg total) by mouth every 6 (six)  hours., Starting 04/23/2015, Until Discontinued, Print      STOP taking these medications     feeding supplement, ENSURE ENLIVE, (ENSURE ENLIVE) LIQD        Allergies  Allergen Reactions  . Peanut-Containing Drug Products Swelling    Swelling of mouth, lips  . Strawberry Extract Swelling    Swelling of mouth, lips  . Ultram [Tramadol] Itching   Follow-up Information    Follow up with Vicenta Aly, FNP. Schedule an appointment as soon as possible for a visit in 1 week.   Specialty:  Nurse Practitioner   Contact information:   [place] [address] [phone]       Follow up with Vicenta Aly, [place].   Specialty:  Nurse Practitioner   Contact information:   [place] [id number] [phone]       Follow up with Vicenta Aly, [place]. Schedule an appointment as soon as possible for a visit in 1 week.   Specialty:  Nurse Practitioner   Contact information:   [address] [place] [address] [phone]        The results of significant diagnostics from this hospitalization (including imaging, microbiology, ancillary and laboratory) are listed below for reference.    Significant Diagnostic Studies: Dg Abd Acute W/chest  06/10/2015  CLINICAL DATA:  Acute onset of nausea, vomiting and generalized abdominal pain. Initial encounter. EXAM: DG ABDOMEN ACUTE W/ 1V CHEST COMPARISON:  Chest and abdominal radiographs performed 03/22/2015, and CT of the abdomen and pelvis performed 04/21/2015 FINDINGS: The lungs are well-aerated and clear. There is no evidence of focal opacification, pleural effusion or pneumothorax. The cardiomediastinal silhouette is within normal limits. The visualized bowel gas pattern is unremarkable. Scattered stool and air are seen within the colon; there is no evidence of small bowel dilatation to suggest obstruction. No free intra-abdominal air is identified on the provided  decubitus view. Clips are noted within the right upper quadrant, reflecting prior cholecystectomy. No acute osseous abnormalities are seen; the sacroiliac joints are unremarkable in appearance. IMPRESSION: 1. Unremarkable bowel gas pattern; no free intra-abdominal air seen. 2. No acute cardiopulmonary process seen. Electronically Signed   By: Garald Balding M.D.   On: 06/10/2015 00:00    Microbiology: Recent Results (from the past 240 hour(s))  MRSA PCR Screening     Status: None   Collection Time: 07/01/15 11:59 PM  Result Value Ref Range Status   MRSA by PCR NEGATIVE NEGATIVE Final    Comment:        The GeneXpert MRSA Assay (FDA approved for NASAL specimens only), is one component of a comprehensive MRSA colonization surveillance program. It is not intended to diagnose MRSA infection nor to guide or monitor treatment for MRSA infections.      Labs: Basic Metabolic Panel:  Recent Labs Lab 07/02/15 2215 07/03/15 0149 07/03/15 0542 07/03/15 0952 07/03/15 1347  NA 138 138 137 136 138  K 3.0* 3.0* 3.2* 3.4* 3.3*  CL 114* 115* 113* 109 109  CO2 15* 16* 14* 18* 19*  GLUCOSE 163* 142* 163* 174* 173*  BUN 7 <5* <5* <5* <5*  CREATININE 0.46  0.57 0.59 0.38* 0.45  CALCIUM 8.5* 7.9* 8.1* 8.3* 8.3*   Liver Function Tests: No results for input(s): AST, ALT, ALKPHOS, BILITOT, PROT, ALBUMIN in the last 168 hours. No results for input(s): LIPASE, AMYLASE in the last 168 hours. No results for input(s): AMMONIA in the last 168 hours. CBC:  Recent Labs Lab 07/01/15 1400 07/01/15 2030 07/03/15 0149  WBC 8.6 18.6* 9.7  NEUTROABS 6.3 14.8*  --   HGB 15.2* 13.7 12.1  HCT 45.0 42.0 36.0  MCV 91.3 95.2 89.8  PLT 581* 564* 408*   Cardiac Enzymes: No results for input(s): CKTOTAL, CKMB, CKMBINDEX, TROPONINI in the last 168 hours. BNP: BNP (last 3 results)  Recent Labs  04/11/15 0325  BNP 43.5    ProBNP (last 3 results) No results for input(s): PROBNP in the last 8760  hours.  CBG:  Recent Labs Lab 07/03/15 0637 07/03/15 0740 07/03/15 0901 07/03/15 1003 07/03/15 1102  GLUCAP 139* 184* 137* 164* 168*       SignedDomenic Polite MD.  [facility] 07/03/2015, 3:29 PM

## 2015-07-03 NOTE — Progress Notes (Signed)
Inpatient Diabetes Program Recommendations  AACE/ADA: New Consensus Statement on Inpatient Glycemic Control (2015)  Target Ranges:  Prepandial:   less than 140 mg/dL      Peak postprandial:   less than 180 mg/dL (1-2 hours)      Critically ill patients:  140 - 180 mg/dL   Lab Results  Component Value Date   GLUCAP 139* 07/03/2015   HGBA1C 9.2* 05/25/2015    Review of Glycemic Control  Diabetes history: DM 1 Outpatient Diabetes medications: Lantus 24 units and novolog 0-20 units 4 times/day depending on cbg value and carbohydrate intake. Current orders for Inpatient glycemic control: DKA order set, to be discontinued once GAP is close. Inpatient Diabetes Program Recommendations:    Noted GAP is closed as of 0630 this am. CO2 still low at 14.  Recommend patient be ordered 20 units lantus 1-2 hrs prior to discontinuation of IV insulin drip and to start sensitive correction at the time the drip is discontinued, then either q 4 hrs if not able to eat or tidwc and HS scale once eating.   Thank you Rosita Kea, RN, MSN, CDE  Diabetes Inpatient Program Office: [phone] Pager: [phone] 8:00 am to 5:00 pm

## 2015-07-03 NOTE — Progress Notes (Signed)
PROGRESS NOTE    [name]  [id number] DOB: May 01, 1990 DOA: 07/01/2015 PCP: Vicenta Aly, FNP   Brief Narrative:  [name] is a 25 year old female with a history of type 1 diabetes mellitus, having previous hospitalizations for diabetic ketoacidosis who was admitted overnight to the medicine service when she presented with intractable nausea and vomiting associate with abdominal pain. Initial lab work revealed diabetic ketoacidosis with an anion gap of 17. He was started on the DKA protocol, given IV fluids and IV insulin admitted to the stepdown unit.   Assessment & Plan:   Principal Problem:   DKA (diabetic ketoacidoses) ([place]) Active Problems:   Uncontrolled type 1 diabetes mellitus ([facility])   Nausea and vomiting  1.  Diabetic ketoacidosis -[name] is a 25 year old female having a history of type 1 diabetes mellitus having previous hospitalizations for diabetic ketoacidosis. -She presents with intractable nausea and vomiting, lab work showed anion gap of 17 -She was started on the DKA protocol treated with IV insulin and IV fluids with her anion gap closing overnight -She continues to have repeated episodes of nausea and vomiting despite scheduled Reglan and other antiemetic therapy -Remaining symptomatic despite multiple antibiotics placement of NG tube on 07/03/2015 -Plan to continue D5 half-normal saline with IV insulin, continue monitoring sugars.   2.  Probable diabetic gastroparesis flare-up -Patient having a history of diabetic gastroparesis, having intractable nausea and vomiting -She is unable to tolerate by mouth intake at this time, will continue IV insulin for now with close monitoring of blood sugars. -Despite scheduled Reglan 10 mg IV 3 times a day and phenergan therapy she continues to have intractible N/V -Will place NG tube  3.  Leukocytosis. -Lab work showing a white count of 18,600 -I suspect secondary to diabetic ketoacidosis -Workup  has not revealed obvious source of infection and she has remained afebrile overnight  4.  History of asthma -Stable, she does not appear to have acute respiratory issues at this time  5.  Hypokalemia -likely related to GI losses from intractable nausea and vomiting -Lab work showing potassium of 3.2, was given for runs of IV potassium yesterday. -Will give an additional 4 runs today  DVT prophylaxis: Lovenox Code Status: Full code Family Communication: I spoke with her mother over telephone, updated her on patient's condition Disposition Plan: Plan to continue IV insulin, remains in step down unit, will place NG tube   Subjective: Still having nausea vomiting, feels ill, no change from yesterday  Objective: Filed Vitals:   07/03/15 0000 07/03/15 0200 07/03/15 0400 07/03/15 0600  BP:  161/113 158/114 160/118  Pulse:  108 111 106  Temp: 98.9 F (37.2 C)  98.7 F (37.1 C)   TempSrc: Oral  Oral   Resp:  16 23 19   Height:      Weight:   50.9 kg (112 lb 3.4 oz)   SpO2:  100% 100% 100%    Intake/Output Summary (Last 24 hours) at 07/03/15 0801 Last data filed at 07/03/15 0714  Gross per 24 hour  Intake 3631.81 ml  Output   2301 ml  Net 1330.81 ml   Filed Weights   07/02/15 0000 07/03/15 0400  Weight: 48.3 kg (106 lb 7.7 oz) 50.9 kg (112 lb 3.4 oz)    Examination:  General exam: Ill-appearing, actively having nausea Respiratory system: Clear to auscultation. Respiratory effort normal. Cardiovascular system: S1 & S2 heard, RRR. No JVD, murmurs, rubs, gallops or clicks. No pedal edema. Gastrointestinal system: There is generalized tenderness to  palpation, no palpable masses, rebound tenderness Central nervous system: Alert and oriented. No focal neurological deficits. Extremities: Symmetric 5 x 5 power. Skin: No rashes, lesions or ulcers Psychiatry: Judgement and insight appear normal. Mood & affect appropriate.     Data Reviewed: I have personally reviewed following  labs and imaging studies  CBC:  Recent Labs Lab 07/01/15 1400 07/01/15 2030 07/03/15 0149  WBC 8.6 18.6* 9.7  NEUTROABS 6.3 14.8*  --   HGB 15.2* 13.7 12.1  HCT 45.0 42.0 36.0  MCV 91.3 95.2 89.8  PLT 581* 564* 123XX123*   Basic Metabolic Panel:  Recent Labs Lab 07/02/15 1347 07/02/15 1819 07/02/15 2215 07/03/15 0149 07/03/15 0542  NA 136 137 138 138 137  K 3.5 3.7 3.0* 3.0* 3.2*  CL 113* 113* 114* 115* 113*  CO2 14* 15* 15* 16* 14*  GLUCOSE 197* 215* 163* 142* 163*  BUN 9 8 7  <5* <5*  CREATININE 0.60 0.52 0.46 0.57 0.59  CALCIUM 8.4* 8.6* 8.5* 7.9* 8.1*   GFR: Estimated Creatinine Clearance: 86.4 mL/min (by C-G formula based on Cr of 0.59). Liver Function Tests: No results for input(s): AST, ALT, ALKPHOS, BILITOT, PROT, ALBUMIN in the last 168 hours. No results for input(s): LIPASE, AMYLASE in the last 168 hours. No results for input(s): AMMONIA in the last 168 hours. Coagulation Profile: No results for input(s): INR, PROTIME in the last 168 hours. Cardiac Enzymes: No results for input(s): CKTOTAL, CKMB, CKMBINDEX, TROPONINI in the last 168 hours. BNP (last 3 results) No results for input(s): PROBNP in the last 8760 hours. HbA1C: No results for input(s): HGBA1C in the last 72 hours. CBG:  Recent Labs Lab 07/03/15 0134 07/03/15 0239 07/03/15 0338 07/03/15 0435 07/03/15 0637  GLUCAP 133* 159* 210* 206* 139*   Lipid Profile: No results for input(s): CHOL, HDL, LDLCALC, TRIG, CHOLHDL, LDLDIRECT in the last 72 hours. Thyroid Function Tests: No results for input(s): TSH, T4TOTAL, FREET4, T3FREE, THYROIDAB in the last 72 hours. Anemia Panel: No results for input(s): VITAMINB12, FOLATE, FERRITIN, TIBC, IRON, RETICCTPCT in the last 72 hours. Sepsis Labs: No results for input(s): PROCALCITON, LATICACIDVEN in the last 168 hours.  Recent Results (from the past 240 hour(s))  MRSA PCR Screening     Status: None   Collection Time: 07/01/15 11:59 PM  Result Value  Ref Range Status   MRSA by PCR NEGATIVE NEGATIVE Final    Comment:        The GeneXpert MRSA Assay (FDA approved for NASAL specimens only), is one component of a comprehensive MRSA colonization surveillance program. It is not intended to diagnose MRSA infection nor to guide or monitor treatment for MRSA infections.          Radiology Studies: No results found.      Scheduled Meds: . enoxaparin (LOVENOX) injection  40 mg Subcutaneous QHS  . escitalopram  5 mg Oral Daily  . famotidine (PEPCID) IV  20 mg Intravenous Q12H  . metoCLOPramide (REGLAN) injection  10 mg Intravenous Q8H  . mometasone-formoterol  2 puff Inhalation BID  . montelukast  10 mg Oral Daily  . potassium chloride  10 mEq Intravenous Q1 Hr x 4   Continuous Infusions: . dextrose 5 % and 0.45% NaCl 200 mL/hr at 07/03/15 0728  . insulin (NOVOLIN-R) infusion 3.7 Units/hr (07/03/15 0741)     LOS: 2 days    Time spent: 35 minutes    Kelvin Cellar, MD [facility] Pager [phone]  If 7PM-7AM, please contact night-coverage [email or web address] Password  [email or web address] 07/03/2015, 8:01 AM

## 2015-07-04 DIAGNOSIS — E1143 Type 2 diabetes mellitus with diabetic autonomic (poly)neuropathy: Secondary | ICD-10-CM

## 2015-07-04 LAB — GLUCOSE, CAPILLARY
GLUCOSE-CAPILLARY: 168 mg/dL — AB (ref 65–99)
GLUCOSE-CAPILLARY: 244 mg/dL — AB (ref 65–99)
GLUCOSE-CAPILLARY: 159 mg/dL — AB (ref 65–99)
GLUCOSE-CAPILLARY: 145 mg/dL — AB (ref 65–99)
GLUCOSE-CAPILLARY: 126 mg/dL — AB (ref 65–99)
GLUCOSE-CAPILLARY: 147 mg/dL — AB (ref 65–99)
GLUCOSE-CAPILLARY: 176 mg/dL — AB (ref 65–99)
GLUCOSE-CAPILLARY: 131 mg/dL — AB (ref 65–99)
GLUCOSE-CAPILLARY: 296 mg/dL — AB (ref 65–99)
Glucose-Capillary: 139 mg/dL — ABNORMAL HIGH (ref 65–99)
Glucose-Capillary: 140 mg/dL — ABNORMAL HIGH (ref 65–99)
Glucose-Capillary: 147 mg/dL — ABNORMAL HIGH (ref 65–99)
Glucose-Capillary: 164 mg/dL — ABNORMAL HIGH (ref 65–99)
Glucose-Capillary: 174 mg/dL — ABNORMAL HIGH (ref 65–99)
Glucose-Capillary: 239 mg/dL — ABNORMAL HIGH (ref 65–99)

## 2015-07-04 LAB — BASIC METABOLIC PANEL
Anion gap: 10 (ref 5–15)
Anion gap: 10 (ref 5–15)
Anion gap: 10 (ref 5–15)
CHLORIDE: 109 mmol/L (ref 101–111)
CHLORIDE: 108 mmol/L (ref 101–111)
CHLORIDE: 108 mmol/L (ref 101–111)
CO2: 18 mmol/L — AB (ref 22–32)
CO2: 18 mmol/L — AB (ref 22–32)
CO2: 20 mmol/L — AB (ref 22–32)
CREATININE: 0.5 mg/dL (ref 0.44–1.00)
CREATININE: 0.48 mg/dL (ref 0.44–1.00)
CREATININE: 0.41 mg/dL — AB (ref 0.44–1.00)
Calcium: 8 mg/dL — ABNORMAL LOW (ref 8.9–10.3)
Calcium: 7.9 mg/dL — ABNORMAL LOW (ref 8.9–10.3)
Calcium: 8 mg/dL — ABNORMAL LOW (ref 8.9–10.3)
GFR calc Af Amer: >60 mL/min (ref >60)
GFR calc Af Amer: >60 mL/min (ref >60)
GFR calc Af Amer: >60 mL/min (ref >60)
GFR calc non Af Amer: >60 mL/min (ref >60)
GFR calc non Af Amer: >60 mL/min (ref >60)
GFR calc non Af Amer: >60 mL/min (ref >60)
GLUCOSE: 190 mg/dL — AB (ref 65–99)
GLUCOSE: 249 mg/dL — AB (ref 65–99)
Glucose, Bld: 154 mg/dL — ABNORMAL HIGH (ref 65–99)
POTASSIUM: 2.8 mmol/L — AB (ref 3.5–5.1)
POTASSIUM: 2.9 mmol/L — AB (ref 3.5–5.1)
POTASSIUM: 3.3 mmol/L — AB (ref 3.5–5.1)
SODIUM: 136 mmol/L (ref 135–145)
SODIUM: 138 mmol/L (ref 135–145)
Sodium: 137 mmol/L (ref 135–145)

## 2015-07-04 LAB — CBC
HEMATOCRIT: 34.2 % — AB (ref 36.0–46.0)
HEMOGLOBIN: 11.7 g/dL — AB (ref 12.0–15.0)
MCH: 30.3 pg (ref 26.0–34.0)
MCHC: 34.2 g/dL (ref 30.0–36.0)
MCV: 88.6 fL (ref 78.0–100.0)
Platelets: 403 10*3/uL — ABNORMAL HIGH (ref 150–400)
RBC: 3.86 MIL/uL — AB (ref 3.87–5.11)
RDW: 13.7 % (ref 11.5–15.5)
WBC: 9.7 10*3/uL (ref 4.0–10.5)

## 2015-07-04 MED ORDER — POTASSIUM CHLORIDE 10 MEQ/100ML IV SOLN
10.0000 meq | INTRAVENOUS | Status: AC
  Administered 2015-07-04 (×6): 10 meq via INTRAVENOUS
  Filled 2015-07-04 (×6): qty 100

## 2015-07-04 MED ORDER — SODIUM CHLORIDE 0.9 % IV SOLN
INTRAVENOUS | Status: DC
  Administered 2015-07-04: 16:00:00 via INTRAVENOUS

## 2015-07-04 MED ORDER — SODIUM CHLORIDE 0.9 % IV BOLUS (SEPSIS)
500.0000 mL | Freq: Once | INTRAVENOUS | Status: AC
  Administered 2015-07-04: 500 mL via INTRAVENOUS

## 2015-07-04 MED ORDER — DEXTROSE-NACL 5-0.45 % IV SOLN
INTRAVENOUS | Status: DC
  Administered 2015-07-04 – 2015-07-05 (×3): via INTRAVENOUS

## 2015-07-04 NOTE — Progress Notes (Signed)
PROGRESS NOTE    [name]  [id number] DOB: August 20, 1990 DOA: 07/01/2015 PCP: Vicenta Aly, FNP   Brief Narrative:  [name] is a 25 year old female with a history of type 1 diabetes mellitus, having previous hospitalizations for diabetic ketoacidosis who was admitted overnight to the medicine service when she presented with intractable nausea and vomiting associate with abdominal pain. Initial lab work revealed diabetic ketoacidosis with an anion gap of 17. He was started on the DKA protocol, given IV fluids and IV insulin admitted to the stepdown unit.   Assessment & Plan:   Principal Problem:   DKA (diabetic ketoacidoses) ([facility]) Active Problems:   Uncontrolled type 1 diabetes mellitus (HCC)   Nausea and vomiting  1.  Diabetic ketoacidosis -[name] is a 25 year old female having a history of type 1 diabetes mellitus having previous hospitalizations for diabetic ketoacidosis. -She presents with intractable nausea and vomiting, lab work showed anion gap of 17 -She was started on the DKA protocol treated with IV insulin and IV fluids with [name] anion gap closing overnight -Having repeated episodes of nausea and vomiting despite scheduled Reglan and other antiemetic therapy, NG tube was placed on 07/03/2015 -On my assessment on 07/04/2015 she is reporting feeling much better -Plan to continue D5 half-normal saline with IV insulin until she is able to tolerate by mouth -Labs otherwise showing improvement with bicarbonate increasing to 20  2.  Probable diabetic gastroparesis flare-up -Patient having a history of diabetic gastroparesis, having intractable nausea and vomiting -She is unable to tolerate by mouth intake at this time, will continue IV insulin for now with close monitoring of blood sugars. -Despite scheduled Reglan 10 mg IV 3 times a day and phenergan therapy she continues to have intractible N/V -An NG tube was placed on 07/03/2015 -Short systematic  improvement by 07/04/2015, will clamp [name] NG tube and give [name] a trial of clears. I think if she tolerates this well we can DC the NG tube and slowly advance [name] diet.  3.  Leukocytosis. -Lab work showing a white count of 18,600 -I suspect secondary to diabetic ketoacidosis -Workup has not revealed obvious source of infection and she has remained afebrile overnight -Repeat labs on 07/03/2068 showing downward trend white count 9700  4.  History of asthma -Stable, she does not appear to have acute respiratory issues at this time  5.  Hypokalemia -likely related to GI losses from intractable nausea and vomiting -Lab work showing potassium of 3.3, will provide IV potassium chloride today.  DVT prophylaxis: Lovenox Code Status: Full code Family Communication: I spoke with [name] mother over telephone, updated [name] on patient's condition Disposition Plan: She seems to be doing better today, will clamp to give a trial of clears.   Subjective: Reports feeling better today, NG tube in place  Objective: Filed Vitals:   07/04/15 0000 07/04/15 0200 07/04/15 0400 07/04/15 0600  BP: 149/120 157/91 158/120 166/105  Pulse: 109 125 116 112  Temp: 98.7 F (37.1 C)  98.6 F (37 C)   TempSrc: Oral  Oral   Resp: 18 18 17 13   Height:      Weight:      SpO2: 100% 99% 100% 100%    Intake/Output Summary (Last 24 hours) at 07/04/15 0800 Last data filed at 07/04/15 0724  Gross per 24 hour  Intake 3860.67 ml  Output   5002 ml  Net -1141.33 ml   Filed Weights   07/02/15 0000 07/03/15 0400  Weight: 48.3 kg (106 lb 7.7 oz)  50.9 kg (112 lb 3.4 oz)    Examination:  General exam: Overall looks better, NG tube in place awake and alert. Respiratory system: Clear to auscultation. Respiratory effort normal. Cardiovascular system: S1 & S2 heard, RRR. No JVD, murmurs, rubs, gallops or clicks. No pedal edema. Gastrointestinal system: There is generalized tenderness to palpation, no palpable masses,  rebound tenderness Central nervous system: Alert and oriented. No focal neurological deficits. Extremities: Symmetric 5 x 5 power. Skin: No rashes, lesions or ulcers Psychiatry: Judgement and insight appear normal. Mood & affect appropriate.     Data Reviewed: I have personally reviewed following labs and imaging studies  CBC:  Recent Labs Lab 07/01/15 1400 07/01/15 2030 07/03/15 0149 07/04/15 0250  WBC 8.6 18.6* 9.7 9.7  NEUTROABS 6.3 14.8*  --   --   HGB 15.2* 13.7 12.1 11.7*  HCT 45.0 42.0 36.0 34.2*  MCV 91.3 95.2 89.8 88.6  PLT 581* 564* 408* [id number]*   Basic Metabolic Panel:  Recent Labs Lab 07/03/15 1347 07/03/15 1818 07/03/15 2312 07/04/15 0250 07/04/15 0652  NA 138 137 137 136 138  K 3.3* 3.0* 2.8* 2.9* 3.3*  CL 109 109 109 108 108  CO2 19* 18* 18* 18* 20*  GLUCOSE 173* 180* 190* 249* 154*  BUN <5* <5* <5* <5* <5*  CREATININE 0.45 0.43* 0.50 0.48 0.41*  CALCIUM 8.3* 8.2* 8.0* 7.9* 8.0*   GFR: Estimated Creatinine Clearance: 86.4 mL/min (by C-G formula based on Cr of 0.41). Liver Function Tests: No results for input(s): AST, ALT, ALKPHOS, BILITOT, PROT, ALBUMIN in the last 168 hours. No results for input(s): LIPASE, AMYLASE in the last 168 hours. No results for input(s): AMMONIA in the last 168 hours. Coagulation Profile: No results for input(s): INR, PROTIME in the last 168 hours. Cardiac Enzymes: No results for input(s): CKTOTAL, CKMB, CKMBINDEX, TROPONINI in the last 168 hours. BNP (last 3 results) No results for input(s): PROBNP in the last 8760 hours. HbA1C: No results for input(s): HGBA1C in the last 72 hours. CBG:  Recent Labs Lab 07/04/15 0018 07/04/15 0112 07/04/15 0220 07/04/15 0327 07/04/15 0631  GLUCAP 139* 168* 244* 159* 164*   Lipid Profile: No results for input(s): CHOL, HDL, LDLCALC, TRIG, CHOLHDL, LDLDIRECT in the last 72 hours. Thyroid Function Tests: No results for input(s): TSH, T4TOTAL, FREET4, T3FREE, THYROIDAB in the  last 72 hours. Anemia Panel: No results for input(s): VITAMINB12, FOLATE, FERRITIN, TIBC, IRON, RETICCTPCT in the last 72 hours. Sepsis Labs: No results for input(s): PROCALCITON, LATICACIDVEN in the last 168 hours.  Recent Results (from the past 240 hour(s))  MRSA PCR Screening     Status: None   Collection Time: 07/01/15 11:59 PM  Result Value Ref Range Status   MRSA by PCR NEGATIVE NEGATIVE Final    Comment:        The GeneXpert MRSA Assay (FDA approved for NASAL specimens only), is one component of a comprehensive MRSA colonization surveillance program. It is not intended to diagnose MRSA infection nor to guide or monitor treatment for MRSA infections.          Radiology Studies: No results found.      Scheduled Meds: . enoxaparin (LOVENOX) injection  40 mg Subcutaneous QHS  . escitalopram  5 mg Oral Daily  . famotidine (PEPCID) IV  20 mg Intravenous Q12H  . metoCLOPramide (REGLAN) injection  10 mg Intravenous Q8H  . mometasone-formoterol  2 puff Inhalation BID  . montelukast  10 mg Oral Daily  . potassium chloride  10 mEq Intravenous Q1 Hr x 6   Continuous Infusions: . dextrose 5 % and 0.45% NaCl 150 mL/hr at 07/04/15 0227  . insulin (NOVOLIN-R) infusion 0.7 Units/hr (07/04/15 0738)     LOS: 3 days    Time spent: 30 minutes    Kelvin Cellar, MD [facility] Pager [phone]  If 7PM-7AM, please contact night-coverage [email or web address] Password [email or web address] 07/04/2015, 8:00 AM

## 2015-07-04 NOTE — Progress Notes (Signed)
Inpatient Diabetes Program Recommendations  AACE/ADA: New Consensus Statement on Inpatient Glycemic Control (2015)  Target Ranges:  Prepandial:   less than 140 mg/dL      Peak postprandial:   less than 180 mg/dL (1-2 hours)      Critically ill patients:  140 - 180 mg/dL   Results for [name], [name] (MRN [id number]) as of 07/04/2015 11:00  Ref. Range 07/04/2015 06:52  Sodium Latest Ref Range: 135-145 mmol/L 138  Potassium Latest Ref Range: 3.5-5.1 mmol/L 3.3 (L)  Chloride Latest Ref Range: 101-111 mmol/L 108  CO2 Latest Ref Range: 22-32 mmol/L 20 (L)  BUN Latest Ref Range: 6-20 mg/dL <5 (L)  Creatinine Latest Ref Range: 0.44-1.00 mg/dL 0.41 (L)  Calcium Latest Ref Range: 8.9-10.3 mg/dL 8.0 (L)  EGFR (Non-African Amer.) Latest Ref Range: >60 mL/min >60  EGFR (African American) Latest Ref Range: >60 mL/min >60  Glucose Latest Ref Range: 65-99 mg/dL 154 (H)  Anion gap Latest Ref Range: 5-15  10    Home DM Meds: Lantus 24 units QHS       Novolog SSI  Current Insulin Orders: IV Insulin drip     MD- When patient is ready to transition off IV insulin drip, please make sure to start at least 80% of patient's home dose of Lantus at least 1 hour before IV insulin drip stopped (per home records, patient taking Lantus 24 units QHS)  Once drip stopped, please also start Novolog Sensitive Correction Scale/ SSI (0-9 units) Q4 hours     --Will follow patient during hospitalization--  Wyn Quaker RN, MSN, CDE Diabetes Coordinator Inpatient Glycemic Control Team Team Pager: [phone] (8a-5p)

## 2015-07-05 ENCOUNTER — Inpatient Hospital Stay (HOSPITAL_COMMUNITY): Payer: BLUE CROSS/BLUE SHIELD

## 2015-07-05 LAB — GLUCOSE, CAPILLARY
GLUCOSE-CAPILLARY: 114 mg/dL — AB (ref 65–99)
GLUCOSE-CAPILLARY: 128 mg/dL — AB (ref 65–99)
GLUCOSE-CAPILLARY: 135 mg/dL — AB (ref 65–99)
GLUCOSE-CAPILLARY: 136 mg/dL — AB (ref 65–99)
GLUCOSE-CAPILLARY: 147 mg/dL — AB (ref 65–99)
GLUCOSE-CAPILLARY: 160 mg/dL — AB (ref 65–99)
GLUCOSE-CAPILLARY: 191 mg/dL — AB (ref 65–99)
GLUCOSE-CAPILLARY: 247 mg/dL — AB (ref 65–99)
Glucose-Capillary: 180 mg/dL — ABNORMAL HIGH (ref 65–99)
Glucose-Capillary: 184 mg/dL — ABNORMAL HIGH (ref 65–99)
Glucose-Capillary: 133 mg/dL — ABNORMAL HIGH (ref 65–99)
Glucose-Capillary: 161 mg/dL — ABNORMAL HIGH (ref 65–99)
Glucose-Capillary: 242 mg/dL — ABNORMAL HIGH (ref 65–99)
Glucose-Capillary: 131 mg/dL — ABNORMAL HIGH (ref 65–99)
Glucose-Capillary: 290 mg/dL — ABNORMAL HIGH (ref 65–99)
Glucose-Capillary: 236 mg/dL — ABNORMAL HIGH (ref 65–99)
Glucose-Capillary: 192 mg/dL — ABNORMAL HIGH (ref 65–99)
Glucose-Capillary: 133 mg/dL — ABNORMAL HIGH (ref 65–99)
Glucose-Capillary: 219 mg/dL — ABNORMAL HIGH (ref 65–99)
Glucose-Capillary: 163 mg/dL — ABNORMAL HIGH (ref 65–99)
Glucose-Capillary: 104 mg/dL — ABNORMAL HIGH (ref 65–99)
Glucose-Capillary: 165 mg/dL — ABNORMAL HIGH (ref 65–99)
Glucose-Capillary: 241 mg/dL — ABNORMAL HIGH (ref 65–99)
Glucose-Capillary: 169 mg/dL — ABNORMAL HIGH (ref 65–99)
Glucose-Capillary: 173 mg/dL — ABNORMAL HIGH (ref 65–99)
Glucose-Capillary: 206 mg/dL — ABNORMAL HIGH (ref 65–99)
Glucose-Capillary: 159 mg/dL — ABNORMAL HIGH (ref 65–99)

## 2015-07-05 LAB — BASIC METABOLIC PANEL
Anion gap: 12 (ref 5–15)
CHLORIDE: 101 mmol/L (ref 101–111)
CO2: 24 mmol/L (ref 22–32)
CREATININE: 0.45 mg/dL (ref 0.44–1.00)
Calcium: 8.6 mg/dL — ABNORMAL LOW (ref 8.9–10.3)
GFR calc Af Amer: >60 mL/min (ref >60)
GFR calc non Af Amer: >60 mL/min (ref >60)
GLUCOSE: 317 mg/dL — AB (ref 65–99)
Potassium: 3 mmol/L — ABNORMAL LOW (ref 3.5–5.1)
SODIUM: 137 mmol/L (ref 135–145)

## 2015-07-05 LAB — CBC
HCT: 35.1 % — ABNORMAL LOW (ref 36.0–46.0)
Hemoglobin: 12.4 g/dL (ref 12.0–15.0)
MCH: 30 pg (ref 26.0–34.0)
MCHC: 35.3 g/dL (ref 30.0–36.0)
MCV: 84.8 fL (ref 78.0–100.0)
PLATELETS: 337 10*3/uL (ref 150–400)
RBC: 4.14 MIL/uL (ref 3.87–5.11)
RDW: 13.5 % (ref 11.5–15.5)
WBC: 8.1 10*3/uL (ref 4.0–10.5)

## 2015-07-05 MED ORDER — INSULIN ASPART 100 UNIT/ML ~~LOC~~ SOLN
0.0000 [IU] | SUBCUTANEOUS | Status: DC
  Administered 2015-07-05 – 2015-07-06 (×2): 3 [IU] via SUBCUTANEOUS
  Administered 2015-07-06: 8 [IU] via SUBCUTANEOUS

## 2015-07-05 MED ORDER — SODIUM CHLORIDE 0.9 % IV BOLUS (SEPSIS)
1000.0000 mL | Freq: Once | INTRAVENOUS | Status: AC
  Administered 2015-07-05: 1000 mL via INTRAVENOUS

## 2015-07-05 MED ORDER — INSULIN GLARGINE 100 UNIT/ML ~~LOC~~ SOLN
25.0000 [IU] | Freq: Every day | SUBCUTANEOUS | Status: DC
  Administered 2015-07-05: 25 [IU] via SUBCUTANEOUS
  Filled 2015-07-05: qty 0.25

## 2015-07-05 MED ORDER — POTASSIUM CHLORIDE 10 MEQ/100ML IV SOLN
10.0000 meq | INTRAVENOUS | Status: AC
  Administered 2015-07-05 (×6): 10 meq via INTRAVENOUS
  Filled 2015-07-05 (×4): qty 100

## 2015-07-05 NOTE — Progress Notes (Signed)
PROGRESS NOTE    [name]  [id number] DOB: [birth year] DOA: 07/01/2015 PCP: Vicenta Aly, FNP   Brief Narrative:  [name] is a 25 year old female with a history of type 1 diabetes mellitus, having previous hospitalizations for diabetic ketoacidosis who was admitted overnight to the medicine service when she presented with intractable nausea and vomiting associate with abdominal pain. Initial lab work revealed diabetic ketoacidosis with an anion gap of 17. He was started on the DKA protocol, given IV fluids and IV insulin admitted to the stepdown unit.   Assessment & Plan:   Principal Problem:   DKA (diabetic ketoacidoses) ([place]) Active Problems:   Uncontrolled type 1 diabetes mellitus (H[facility])   Nausea and vomiting  1.  Diabetic ketoacidosis -[name] is a 25 year old female having a history of type 1 diabetes mellitus having previous hospitalizations for diabetic ketoacidosis. -She presents with intractable nausea and vomiting, lab work showed anion gap of 17 -She was started on the DKA protocol treated with IV insulin and IV fluids with her anion gap closing overnight -Having repeated episodes of nausea and vomiting despite scheduled Reglan and other antiemetic therapy, NG tube was placed on 07/03/2015 -On 07/04/2015 NG tube was clamped and she was given a trial of clears, unable to tolerate for which her NG tube was continued for the following 24 hours. On reassessment on 07/05/2015 she thinks she is feeling better with nursing staff reporting that she has not required as much antiemetic therapy. Labs reviewed, bicarbonate improved to 24.  -We'll see how she does over the course of the day, check a KUB, consider another trial of clears later on today.  2.  Probable diabetic gastroparesis flare-up -Patient having a history of diabetic gastroparesis, having intractable nausea and vomiting -She is unable to tolerate by mouth intake at this time, will continue IV  insulin for now with close monitoring of blood sugars. -Despite scheduled Reglan 10 mg IV 3 times a day and phenergan therapy she continues to have intractible N/V -An NG tube was placed on 07/03/2015 -On 07/04/2015 she was unable to tolerate by mouth. On physical exam she had hypoactive bowel sounds, denies passing flatus, reports generalized abdominal pain. Will check a KUB.   3.  Leukocytosis. -Lab work showing a white count of 18,600 -I suspect secondary to diabetic ketoacidosis -Workup has not revealed obvious source of infection and she has remained afebrile overnight -Repeat labs on 07/03/2068 showing downward trend white count 9700  4.  History of asthma -Stable, she does not appear to have acute respiratory issues at this time  5.  Hypokalemia -likely related to GI losses from intractable nausea and vomiting -A.m. labs showing potassium of 3.0, will provide IV potassium replacement today.  DVT prophylaxis: Lovenox Code Status: Full code Family Communication: I spoke with her mother over telephone yesterday evening Disposition Plan: Remains in step down unit   Subjective: Reports feeling better today, NG tube in place  Objective: Filed Vitals:   07/05/15 0400 07/05/15 0500 07/05/15 0600 07/05/15 0747  BP: 160/109  112/57 112/57  Pulse: 126 145 132 125  Temp: 99.1 F (37.3 C)     TempSrc: Oral     Resp: 16 16 17 17   Height:      Weight:      SpO2: 99% 98% 96% 100%    Intake/Output Summary (Last 24 hours) at 07/05/15 0749 Last data filed at 07/05/15 0612  Gross per 24 hour  Intake 3279.72 ml  Output   4750 ml  Net -1470.28 ml   Filed Weights   07/02/15 0000 07/03/15 0400  Weight: 48.3 kg (106 lb 7.7 oz) 50.9 kg (112 lb 3.4 oz)    Examination:  General exam: Overall looks better, NG tube in place awake and alert. Respiratory system: Clear to auscultation. Respiratory effort normal. Cardiovascular system: S1 & S2 heard, RRR. No JVD, murmurs, rubs, gallops  or clicks. No pedal edema. Gastrointestinal system: Hypoactive bowel sounds, abdomen did not appear to be distended, there is mild generalized tenderness to palpation Central nervous system: Alert and oriented. No focal neurological deficits. Extremities: Symmetric 5 x 5 power. Skin: No rashes, lesions or ulcers Psychiatry: Judgement and insight appear normal. Mood & affect appropriate.     Data Reviewed: I have personally reviewed following labs and imaging studies  CBC:  Recent Labs Lab 07/01/15 1400 07/01/15 2030 07/03/15 0149 07/04/15 0250 07/05/15 0304  WBC 8.6 18.6* 9.7 9.7 8.1  NEUTROABS 6.3 14.8*  --   --   --   HGB 15.2* 13.7 12.1 11.7* 12.4  HCT 45.0 42.0 36.0 34.2* 35.1*  MCV 91.3 95.2 89.8 88.6 84.8  PLT 581* 564* 408* 403* [id number]   Basic Metabolic Panel:  Recent Labs Lab 07/03/15 1818 07/03/15 2312 07/04/15 0250 07/04/15 0652 07/05/15 0304  NA 137 137 136 138 137  K 3.0* 2.8* 2.9* 3.3* 3.0*  CL 109 109 108 108 101  CO2 18* 18* 18* 20* 24  GLUCOSE 180* 190* 249* 154* 317*  BUN <5* <5* <5* <5* <5*  CREATININE 0.43* 0.50 0.48 0.41* 0.45  CALCIUM 8.2* 8.0* 7.9* 8.0* 8.6*   GFR: Estimated Creatinine Clearance: 86.4 mL/min (by C-G formula based on Cr of 0.45). Liver Function Tests: No results for input(s): AST, ALT, ALKPHOS, BILITOT, PROT, ALBUMIN in the last 168 hours. No results for input(s): LIPASE, AMYLASE in the last 168 hours. No results for input(s): AMMONIA in the last 168 hours. Coagulation Profile: No results for input(s): INR, PROTIME in the last 168 hours. Cardiac Enzymes: No results for input(s): CKTOTAL, CKMB, CKMBINDEX, TROPONINI in the last 168 hours. BNP (last 3 results) No results for input(s): PROBNP in the last 8760 hours. HbA1C: No results for input(s): HGBA1C in the last 72 hours. CBG:  Recent Labs Lab 07/05/15 0247 07/05/15 0354 07/05/15 0504 07/05/15 0610 07/05/15 0717  GLUCAP 290* 191* 135* 236* 192*   Lipid  Profile: No results for input(s): CHOL, HDL, LDLCALC, TRIG, CHOLHDL, LDLDIRECT in the last 72 hours. Thyroid Function Tests: No results for input(s): TSH, T4TOTAL, FREET4, T3FREE, THYROIDAB in the last 72 hours. Anemia Panel: No results for input(s): VITAMINB12, FOLATE, FERRITIN, TIBC, IRON, RETICCTPCT in the last 72 hours. Sepsis Labs: No results for input(s): PROCALCITON, LATICACIDVEN in the last 168 hours.  Recent Results (from the past 240 hour(s))  MRSA PCR Screening     Status: None   Collection Time: 07/01/15 11:59 PM  Result Value Ref Range Status   MRSA by PCR NEGATIVE NEGATIVE Final    Comment:        The GeneXpert MRSA Assay (FDA approved for NASAL specimens only), is one component of a comprehensive MRSA colonization surveillance program. It is not intended to diagnose MRSA infection nor to guide or monitor treatment for MRSA infections.          Radiology Studies: No results found.      Scheduled Meds: . enoxaparin (LOVENOX) injection  40 mg Subcutaneous QHS  . escitalopram  5 mg Oral Daily  . famotidine (  PEPCID) IV  20 mg Intravenous Q12H  . metoCLOPramide (REGLAN) injection  10 mg Intravenous Q8H  . mometasone-formoterol  2 puff Inhalation BID  . montelukast  10 mg Oral Daily  . potassium chloride  10 mEq Intravenous Q1 Hr x 6  . sodium chloride  1,000 mL Intravenous Once   Continuous Infusions: . dextrose 5 % and 0.45% NaCl 100 mL/hr at 07/05/15 0600  . insulin (NOVOLIN-R) infusion 4 Units/hr (07/05/15 0718)     LOS: 4 days    Time spent: 30 minutes    Kelvin Cellar, MD [facility] Pager [phone]  If 7PM-7AM, please contact night-coverage [email or web address] Password [email or web address] 07/05/2015, 7:49 AM

## 2015-07-05 NOTE — Progress Notes (Addendum)
Nutrition Follow-up  DOCUMENTATION CODES:   Severe malnutrition in context of acute illness/injury, Underweight  INTERVENTION:  - Continue CLD with sips and continue to advance diet as medically feasible. - RD will continue to follow up and will provide supplements and/or snacks per pt preference with further diet advancement.  NUTRITION DIAGNOSIS:   Inadequate oral intake related to inability to eat as evidenced by NPO status. -advanced to CLD with sips allowed yesterday AM; ongoing  GOAL:   Patient will meet greater than or equal to 90% of their needs -unmet  MONITOR:   Diet advancement, PO intake, Weight trends, Labs, I & O's  ASSESSMENT:   25 year old female with history of DMI, asthma and gastroparesis who came today with cc of N/V. She saw some blood in her vomitus after repeated vomiting but no coffee ground hematemesis. She has diffuse abdominal pain without diarrhea. No fever or chills. No chest pain,dyspnea or cough.she has headache.   6/16 NGT clamped and diet advanced to CLD with allowed sips yesterday AM; no documented intakes at this time. Pt again sleeping this AM with no family/visitors present. Pt meets criteria for severe malnutrition in acute illness based on weight loss and <50% intakes for >/= 5 days.   RD will follow-up with further diet advancement and provide nutrition supplements and/or snacks per pt preference at that time. Not meeting needs since admission. Medications reviewed; 6 runs of KCl yesterday and today, scheduled IV Reglan, PRN Phenergan and Zofran. Labs reviewed; CBGs: 128-290 mg/dL today, K: 3 mmol/L, BUN <5 mg/dL, Ca: 8.6 mg/dL.  ADDENDUM: IVF: D5-1/2 NS @ 75 mL/hr (306 kcal) Drip: 0.7 units/hr insulin    6/14 - Pt asleep again this AM with no family/visitors present.  - NGT now in place (placed this AM) to suction with no drainage present in canister at this time.  - Emesis basin resting next to pt with a few wet spots on paper towels  in basin but no overt emesis present.  - Will complete physical assessment at follow-up rather than today d/t respect for pt's comfort.  - Able to visualize muscle and fat wasting. - As documented in note yesterday, pt has lost 9 lbs (7.8% body weight) in the past 1 month which is significant for time frame.  - Pt continues to be NPO and unable to meet nutrition needs.   6/13 - Pt has been NPO since admission and unable to meet nutrition needs.  - Pt sleeping at time of visit with no family/visitors present. - Spoke with RN who states that pt was not feeling well throughout the morning and was having ongoing nausea with dry heaving but no emesis.  - Documented hx of type 1 DM with gastroparesis and admission for DKA. - Physical assessment will be completed at follow-up with findings documented accordingly.  - Pt seen by RD 06/24/15 with documented mild/moderate muscle and mild/moderate fat wasting to all areas.  - Per chart review, pt has lost 9 lbs (7.8% body weight) in the past 1 month which is significant for time frame. - Pt meets criteria for malnutrition but will document on severity at follow-up.   Diet Order:  Diet clear liquid Room service appropriate?: Yes; Fluid consistency:: Thin  Skin:  Reviewed, no issues  Last BM:  6/13  Height:   Ht Readings from Last 1 Encounters:  07/02/15 5\' 4"  (1.626 m)    Weight:   Wt Readings from Last 1 Encounters:  07/03/15 112 lb 3.4  oz (50.9 kg)    Ideal Body Weight:  54.54 kg (kg)  BMI:  Body mass index is 19.25 kg/(m^2).  Estimated Nutritional Needs:   Kcal:  1450-1650  Protein:  50-60 grams  Fluid:  >/= 1.6 L/day  EDUCATION NEEDS:   No education needs identified at this time     Jarome Matin, MS, RD, LDN Inpatient Clinical Dietitian Pager # [phone] After hours/weekend pager # [phone]

## 2015-07-06 LAB — GLUCOSE, CAPILLARY
GLUCOSE-CAPILLARY: 46 mg/dL — AB (ref 65–99)
GLUCOSE-CAPILLARY: 127 mg/dL — AB (ref 65–99)
GLUCOSE-CAPILLARY: 103 mg/dL — AB (ref 65–99)
GLUCOSE-CAPILLARY: 289 mg/dL — AB (ref 65–99)
Glucose-Capillary: 69 mg/dL (ref 65–99)
Glucose-Capillary: 186 mg/dL — ABNORMAL HIGH (ref 65–99)

## 2015-07-06 MED ORDER — POTASSIUM CHLORIDE CRYS ER 20 MEQ PO TBCR: 40.0000 meq | EXTENDED_RELEASE_TABLET | Freq: Once | ORAL | Status: DC

## 2015-07-06 MED ORDER — POTASSIUM CHLORIDE 10 MEQ/100ML IV SOLN
10.0000 meq | INTRAVENOUS | Status: AC
  Administered 2015-07-06: 10 meq via INTRAVENOUS
  Filled 2015-07-06 (×2): qty 100

## 2015-07-06 MED ORDER — POTASSIUM CHLORIDE CRYS ER 20 MEQ PO TBCR: 40.0000 meq | EXTENDED_RELEASE_TABLET | ORAL | Status: DC

## 2015-07-06 MED ORDER — PROMETHAZINE HCL 12.5 MG RE SUPP: 12.5000 mg | Freq: Four times a day (QID) | RECTAL | Status: DC | PRN

## 2015-07-06 MED ORDER — POTASSIUM CHLORIDE CRYS ER 20 MEQ PO TBCR
40.0000 meq | EXTENDED_RELEASE_TABLET | ORAL | Status: AC
  Administered 2015-07-06 (×2): 40 meq via ORAL
  Filled 2015-07-06 (×2): qty 2

## 2015-07-06 MED ORDER — POTASSIUM CHLORIDE CRYS ER 20 MEQ PO TBCR: 40.0000 meq | EXTENDED_RELEASE_TABLET | Freq: Four times a day (QID) | ORAL | Status: DC

## 2015-07-06 NOTE — Discharge Summary (Signed)
Physician Discharge Summary  [name] [id number] DOB: 02-04-1990 DOA: 07/01/2015  PCP: Stefanie Aly, FNP  Admit date: 07/01/2015 Discharge date: 07/06/2015  Time spent: 35 minutes  Recommendations for Outpatient Follow-up:  1. Please follow up on blood sugars, she was admitted for DKA. I recommended that she follow up with her endocrinologist Dr [name] this week 2. Follow up on BMP on hospital follow up, she was hypokalemic during this hospitalization. On day of discharge she had a K of 3.0, replaced with PO and IV potassium.   Discharge Diagnoses:  Principal Problem:   DKA (diabetic ketoacidoses) ([place]) Active Problems:   Uncontrolled type 1 diabetes mellitus (HCC)   Nausea and vomiting   Discharge Condition: Stable/Improved  Diet recommendation: Carb Mod Diet  Filed Weights   07/02/15 0000 07/03/15 0400  Weight: 48.3 kg (106 lb 7.7 oz) 50.9 kg (112 lb 3.4 oz)    History of present illness:  Patient is a 25 year old female with history of DMI, asthma and gastroparesis who came today with cc of N/V. She saw some blood in her vomitus after repeated vomiting but no coffee ground hematemesis. She has diffuse abdominal pain without diarrhea. No fever or chills. No chest pain,dyspnea or cough.she has headache.   Hospital Course:  1. Diabetic ketoacidosis -[name] is a 25 year old female having a history of type 1 diabetes mellitus having previous hospitalizations for diabetic ketoacidosis. -She presents with intractable nausea and vomiting, lab work showed anion gap of 17 -She was started on the DKA protocol treated with IV insulin and IV fluids with her anion gap closing overnight -Having repeated episodes of nausea and vomiting despite scheduled Reglan and other antiemetic therapy, NG tube was placed on 07/03/2015 -On 07/04/2015 NG tube was clamped and she was given a trial of clears, unable to tolerate for which her NG tube was continued for the following 24  hours. On reassessment on 07/05/2015 she thinks she is feeling better with nursing staff reporting that she has not required as much antiemetic therapy. Labs reviewed, bicarbonate improved to 24.  -She continued to improve as NG tube was discontinued and her diet was advanced. She was placed on her basal insulin.   2. Probable diabetic gastroparesis flare-up -Patient having a history of diabetic gastroparesis, having intractable nausea and vomiting -She is unable to tolerate by mouth intake at this time, will continue IV insulin for now with close monitoring of blood sugars. -Despite scheduled Reglan 10 mg IV 3 times a day and phenergan therapy she continues to have intractible N/V -An NG tube was placed on 07/03/2015 -She showed clinical improvement and NG tube was removed. -By 07/06/2015 she was tolerating PO intake  3. Leukocytosis. -Lab work showing a white count of 18,600 -I suspect secondary to diabetic ketoacidosis -Workup has not revealed obvious source of infection and she has remained afebrile overnight -Repeat labs on 07/03/2068 showing downward trend white count 9700  4. History of asthma -Stable, she does not appear to have acute respiratory issues at this time  5. Hypokalemia -likely related to GI losses from intractable nausea and vomiting -A.m. labs showing potassium of 3.0 she was given PO and IV potassium replacement prior to discharge   Procedures:  NG tube placement   Discharge Exam: Filed Vitals:   07/06/15 0400 07/06/15 0500  BP: 145/100   Pulse: 97 99  Temp: 99.3 F (37.4 C)   Resp: 17 15    General exam: Looks much better, NG tube out.  Respiratory  system: Clear to auscultation. Respiratory effort normal. Cardiovascular system: S1 & S2 heard, RRR. No JVD, murmurs, rubs, gallops or clicks. No pedal edema. Gastrointestinal system:NT, ND, having positive bowel sounds Central nervous system: Alert and oriented. No focal neurological  deficits. Extremities: Symmetric 5 x 5 power. Skin: No rashes, lesions or ulcers Psychiatry: Judgement and insight appear normal. Mood & affect appropriate.   Discharge Instructions   Discharge Instructions    Call MD for:  difficulty breathing, headache or visual disturbances    Complete by:  As directed      Call MD for:  extreme fatigue    Complete by:  As directed      Call MD for:  hives    Complete by:  As directed      Call MD for:  persistant dizziness or light-headedness    Complete by:  As directed      Call MD for:  persistant nausea and vomiting    Complete by:  As directed      Call MD for:  redness, tenderness, or signs of infection (pain, swelling, redness, odor or green/yellow discharge around incision site)    Complete by:  As directed      Call MD for:  severe uncontrolled pain    Complete by:  As directed      Call MD for:  temperature >100.4    Complete by:  As directed      Call MD for:    Complete by:  As directed      Diet - low sodium heart healthy    Complete by:  As directed      Increase activity slowly    Complete by:  As directed           Current Discharge Medication List    START taking these medications   Details  promethazine (PHENERGAN) 12.5 MG suppository Place 1 suppository (12.5 mg total) rectally every 6 (six) hours as needed for nausea or vomiting. Qty: 8 each, Refills: 0      CONTINUE these medications which have NOT CHANGED   Details  albuterol (PROVENTIL HFA;VENTOLIN HFA) 108 (90 BASE) MCG/ACT inhaler Inhale 2 puffs into the lungs every 6 (six) hours as needed for shortness of breath.    albuterol (PROVENTIL) (2.5 MG/3ML) 0.083% nebulizer solution Take 2.5 mg by nebulization every 6 (six) hours as needed for shortness of breath.    budesonide-formoterol (SYMBICORT) 160-4.5 MCG/ACT inhaler Inhale 2 puffs into the lungs daily.     EPINEPHrine (EPIPEN) 0.3 mg/0.3 mL SOAJ injection Inject 0.3 mg into the muscle once. As needed  for allergic reaction. Never had to use before    escitalopram (LEXAPRO) 10 MG tablet Take 5 mg by mouth daily.    insulin aspart (NOVOLOG FLEXPEN) 100 UNIT/ML FlexPen INJECT 0-20 UNITS INTO THE SKIN AS DIRECTED. SSI - CARB SCALE, 0-20 UNITS Qty: 5 pen, Refills: 0    Insulin Glargine (LANTUS SOLOSTAR) 100 UNIT/ML Solostar Pen Inject 24 Units into the skin daily at 10 pm. Refills: 0    metoCLOPramide (REGLAN) 10 MG tablet Take 1 tablet (10 mg total) by mouth 3 (three) times daily before meals. Qty: 45 tablet, Refills: 0    montelukast (SINGULAIR) 10 MG tablet Take 10 mg by mouth daily.    ondansetron (ZOFRAN) 4 MG tablet Take 1 tablet (4 mg total) by mouth every 6 (six) hours. Qty: 12 tablet, Refills: 0    omeprazole (PRILOSEC) 40 MG capsule Take 1 capsule (  40 mg total) by mouth daily. Qty: 30 capsule, Refills: 1      STOP taking these medications     oxyCODONE-acetaminophen (PERCOCET/ROXICET) 5-325 MG tablet        Allergies  Allergen Reactions  . Peanut-Containing Drug Products Swelling    Swelling of mouth, lips  . Strawberry Extract Swelling    Swelling of mouth, lips  . Ultram [Tramadol] Itching   Follow-up Information    Follow up with Stefanie Aly, FNP.   Specialty:  Nurse Practitioner   Contact information:   [place] [address] [phone]       Follow up with LEVY, MATTHEW E, MD In 4 days.   Specialty:  Endocrinology   Contact information:   [id number] [place] [address] [phone]        The results of significant diagnostics from this hospitalization (including imaging, microbiology, ancillary and laboratory) are listed below for reference.    Significant Diagnostic Studies: Dg Abd 1 View  07/05/2015  CLINICAL DATA:  Abdominal distension, nausea and vomiting EXAM: ABDOMEN - 1 VIEW COMPARISON:  06/09/2015 FINDINGS: There is normal small bowel gas pattern. NG tube in place with tip in  distal stomach/pyloric region. IMPRESSION: Normal small bowel gas pattern. NG tube in place with tip in distal stomach/ pyloric region. Electronically Signed   By: Lahoma Crocker M.D.   On: 07/05/2015 09:18   Dg Abd Acute W/chest  06/10/2015  CLINICAL DATA:  Acute onset of nausea, vomiting and generalized abdominal pain. Initial encounter. EXAM: DG ABDOMEN ACUTE W/ 1V CHEST COMPARISON:  Chest and abdominal radiographs performed 03/22/2015, and CT of the abdomen and pelvis performed 04/21/2015 FINDINGS: The lungs are well-aerated and clear. There is no evidence of focal opacification, pleural effusion or pneumothorax. The cardiomediastinal silhouette is within normal limits. The visualized bowel gas pattern is unremarkable. Scattered stool and air are seen within the colon; there is no evidence of small bowel dilatation to suggest obstruction. No free intra-abdominal air is identified on the provided decubitus view. Clips are noted within the right upper quadrant, reflecting prior cholecystectomy. No acute osseous abnormalities are seen; the sacroiliac joints are unremarkable in appearance. IMPRESSION: 1. Unremarkable bowel gas pattern; no free intra-abdominal air seen. 2. No acute cardiopulmonary process seen. Electronically Signed   By: Garald Balding M.D.   On: 06/10/2015 00:00    Microbiology: Recent Results (from the past 240 hour(s))  MRSA PCR Screening     Status: None   Collection Time: 07/01/15 11:59 PM  Result Value Ref Range Status   MRSA by PCR NEGATIVE NEGATIVE Final    Comment:        The GeneXpert MRSA Assay (FDA approved for NASAL specimens only), is one component of a comprehensive MRSA colonization surveillance program. It is not intended to diagnose MRSA infection nor to guide or monitor treatment for MRSA infections.      Labs: Basic Metabolic Panel:  Recent Labs Lab 07/03/15 1818 07/03/15 2312 07/04/15 0250 07/04/15 0652 07/05/15 0304  NA 137 137 136 138 137  K  3.0* 2.8* 2.9* 3.3* 3.0*  CL 109 109 108 108 101  CO2 18* 18* 18* 20* 24  GLUCOSE 180* 190* 249* 154* 317*  BUN <5* <5* <5* <5* <5*  CREATININE 0.43* 0.50 0.48 0.41* 0.45  CALCIUM 8.2* 8.0* 7.9* 8.0* 8.6*   Liver Function Tests: No results for input(s): AST, ALT, ALKPHOS, BILITOT, PROT, ALBUMIN in the last 168 hours.  No results for input(s): LIPASE, AMYLASE in the last 168 hours. No results for input(s): AMMONIA in the last 168 hours. CBC:  Recent Labs Lab 07/01/15 1400 07/01/15 2030 07/03/15 0149 07/04/15 0250 07/05/15 0304  WBC 8.6 18.6* 9.7 9.7 8.1  NEUTROABS 6.3 14.8*  --   --   --   HGB 15.2* 13.7 12.1 11.7* 12.4  HCT 45.0 42.0 36.0 34.2* 35.1*  MCV 91.3 95.2 89.8 88.6 84.8  PLT 581* 564* 408* 403* 337   Cardiac Enzymes: No results for input(s): CKTOTAL, CKMB, CKMBINDEX, TROPONINI in the last 168 hours. BNP: BNP (last 3 results)  Recent Labs  04/11/15 0325  BNP 43.5    ProBNP (last 3 results) No results for input(s): PROBNP in the last 8760 hours.  CBG:  Recent Labs Lab 07/05/15 1957 07/05/15 2100 07/06/15 0334 07/06/15 0407 07/06/15 0454  GLUCAP 206* 159* 46* 69 127*       Signed:  Kelvin Cellar MD.  [facility] 07/06/2015, 7:22 AM

## 2015-07-09 ENCOUNTER — Encounter (HOSPITAL_COMMUNITY): Payer: Self-pay | Admitting: Emergency Medicine

## 2015-07-09 ENCOUNTER — Emergency Department (HOSPITAL_COMMUNITY)
Admission: EM | Admit: 2015-07-09 | Discharge: 2015-07-09 | Disposition: A | Discharge status: Home / Self Care | Payer: BLUE CROSS/BLUE SHIELD | Source: Home / Self Care | Attending: Emergency Medicine | Admitting: Emergency Medicine

## 2015-07-09 DIAGNOSIS — Z79899 Other long term (current) drug therapy: Secondary | ICD-10-CM | POA: Insufficient documentation

## 2015-07-09 DIAGNOSIS — I10 Essential (primary) hypertension: Secondary | ICD-10-CM

## 2015-07-09 DIAGNOSIS — M1990 Unspecified osteoarthritis, unspecified site: Secondary | ICD-10-CM | POA: Insufficient documentation

## 2015-07-09 DIAGNOSIS — E119 Type 2 diabetes mellitus without complications: Secondary | ICD-10-CM

## 2015-07-09 DIAGNOSIS — F1290 Cannabis use, unspecified, uncomplicated: Secondary | ICD-10-CM

## 2015-07-09 DIAGNOSIS — J45909 Unspecified asthma, uncomplicated: Secondary | ICD-10-CM | POA: Insufficient documentation

## 2015-07-09 DIAGNOSIS — K3184 Gastroparesis: Secondary | ICD-10-CM

## 2015-07-09 DIAGNOSIS — E1043 Type 1 diabetes mellitus with diabetic autonomic (poly)neuropathy: Secondary | ICD-10-CM | POA: Diagnosis not present

## 2015-07-09 DIAGNOSIS — Z794 Long term (current) use of insulin: Secondary | ICD-10-CM

## 2015-07-09 DIAGNOSIS — R112 Nausea with vomiting, unspecified: Secondary | ICD-10-CM | POA: Diagnosis not present

## 2015-07-09 LAB — URINALYSIS, ROUTINE W REFLEX MICROSCOPIC
BILIRUBIN URINE: NEGATIVE
Glucose, UA: >1000 mg/dL — AB
Hgb urine dipstick: NEGATIVE
Ketones, ur: >80 mg/dL — AB
LEUKOCYTES UA: NEGATIVE
NITRITE: NEGATIVE
PH: 8 (ref 5.0–8.0)
Protein, ur: 30 mg/dL — AB
SPECIFIC GRAVITY, URINE: 1.029 (ref 1.005–1.030)

## 2015-07-09 LAB — URINE MICROSCOPIC-ADD ON

## 2015-07-09 LAB — CBC
HEMATOCRIT: 40.8 % (ref 36.0–46.0)
HEMOGLOBIN: 14 g/dL (ref 12.0–15.0)
MCH: 30.1 pg (ref 26.0–34.0)
MCHC: 34.3 g/dL (ref 30.0–36.0)
MCV: 87.7 fL (ref 78.0–100.0)
Platelets: 492 10*3/uL — ABNORMAL HIGH (ref 150–400)
RBC: 4.65 MIL/uL (ref 3.87–5.11)
RDW: 13.6 % (ref 11.5–15.5)
WBC: 9.3 10*3/uL (ref 4.0–10.5)

## 2015-07-09 LAB — COMPREHENSIVE METABOLIC PANEL
ALT: 14 U/L (ref 14–54)
ANION GAP: 16 — AB (ref 5–15)
AST: 26 U/L (ref 15–41)
Albumin: 4.6 g/dL (ref 3.5–5.0)
Alkaline Phosphatase: 105 U/L (ref 38–126)
BUN: 15 mg/dL (ref 6–20)
CO2: 22 mmol/L (ref 22–32)
Calcium: 9.7 mg/dL (ref 8.9–10.3)
Chloride: 102 mmol/L (ref 101–111)
Creatinine, Ser: 0.55 mg/dL (ref 0.44–1.00)
GFR calc Af Amer: >60 mL/min (ref >60)
GLUCOSE: 108 mg/dL — AB (ref 65–99)
POTASSIUM: 3.5 mmol/L (ref 3.5–5.1)
SODIUM: 140 mmol/L (ref 135–145)
TOTAL PROTEIN: 8.1 g/dL (ref 6.5–8.1)
Total Bilirubin: 1 mg/dL (ref 0.3–1.2)

## 2015-07-09 LAB — LIPASE, BLOOD: LIPASE: 12 U/L (ref 11–51)

## 2015-07-09 MED ORDER — SODIUM CHLORIDE 0.9 % IV SOLN: INTRAVENOUS | Status: DC

## 2015-07-09 MED ORDER — HALOPERIDOL LACTATE 5 MG/ML IJ SOLN: 5.0000 mg | Freq: Once | INTRAMUSCULAR | Status: DC

## 2015-07-09 MED ORDER — HALOPERIDOL LACTATE 5 MG/ML IJ SOLN
4.0000 mg | Freq: Once | INTRAMUSCULAR | Status: AC
  Administered 2015-07-09: 4 mg via INTRAVENOUS
  Filled 2015-07-09: qty 1

## 2015-07-09 MED ORDER — PROMETHAZINE HCL 25 MG PO TABS: 25.0000 mg | ORAL_TABLET | Freq: Four times a day (QID) | ORAL | Status: DC | PRN

## 2015-07-09 MED ORDER — SODIUM CHLORIDE 0.9 % IV BOLUS (SEPSIS)
1000.0000 mL | Freq: Once | INTRAVENOUS | Status: AC
  Administered 2015-07-09: 1000 mL via INTRAVENOUS

## 2015-07-09 MED ORDER — SODIUM CHLORIDE 0.9 % IV BOLUS (SEPSIS): 1000.0000 mL | Freq: Once | INTRAVENOUS | Status: DC

## 2015-07-09 MED ORDER — METOCLOPRAMIDE HCL 5 MG/ML IJ SOLN
10.0000 mg | Freq: Once | INTRAMUSCULAR | Status: AC
  Administered 2015-07-09: 10 mg via INTRAVENOUS
  Filled 2015-07-09: qty 2

## 2015-07-09 MED ORDER — PROMETHAZINE HCL 25 MG RE SUPP: 25.0000 mg | Freq: Four times a day (QID) | RECTAL | Status: DC | PRN

## 2015-07-09 NOTE — ED Provider Notes (Signed)
CSN: [id number]     Arrival date & time 07/09/15  1327 History   First MD Initiated Contact with Patient 07/09/15 1421     Chief Complaint  Patient presents with  . Emesis  . Hematemesis     (Consider location/radiation/quality/duration/timing/severity/associated sxs/prior Treatment) Patient is a 25 y.o. female presenting with vomiting. The history is provided by the patient.  Emesis Associated symptoms: abdominal pain   Associated symptoms: no headaches and no myalgias   Patient with multiple visits for gastroparesis chronic abdominal pain and DKA. Patient is discharged 2 days ago. Patient started this morning with the abdominal pain nausea and vomiting. Patient has Reglan at home but states that she ran out of her Phenergan. Patient denies fevers.  Past Medical History  Diagnosis Date  . Diabetes mellitus 2007    IDDM.  poorly controlled, multiple admits with DKA  . Asthma   . Pancreatitis, acute 11/26/2014  . Hepatic steatosis 11/26/2014    and hepatomegaly  . Liver mass 11/26/2014  . Hypertension     NOT CURRENTLY ON ANY BP MED  . Heart murmur   . Arthritis   . Gallstones   . Anxiety    Past Surgical History  Procedure Laterality Date  . Wisdom tooth extraction    . Esophagogastroduodenoscopy (egd) with propofol Left 09/20/2014    Procedure: ESOPHAGOGASTRODUODENOSCOPY (EGD) WITH PROPOFOL;  Surgeon: Arta Silence, MD;  Location: [facility] ENDOSCOPY;  Service: Endoscopy;  Laterality: Left;  . Cholecystectomy N/A 02/11/2015    Procedure: LAPAROSCOPIC CHOLECYSTECTOMY WITH INTRAOPERATIVE CHOLANGIOGRAM;  Surgeon: Greer Pickerel, MD;  Location: [facility] ORS;  Service: General;  Laterality: N/A;   Family History  Problem Relation Age of Onset  . Diabetes Mother   . Hyperlipidemia Mother   . Hypertension Father   . Heart disease Father   . Heart disease Maternal Grandmother   . Heart disease Maternal Grandfather   . Hypertension Paternal Grandmother   . Cancer Paternal Grandfather    Social  History  Substance Use Topics  . Smoking status: Never Smoker   . Smokeless tobacco: Never Used  . Alcohol Use: No   OB History    Gravida Para Term Preterm AB TAB SAB Ectopic Multiple Living   2 1 0 1 1 1 0 0 0 1      Review of Systems  Constitutional: Negative for fever.  HENT: Negative for congestion.   Eyes: Negative for visual disturbance.  Respiratory: Negative for shortness of breath.   Cardiovascular: Negative for chest pain.  Gastrointestinal: Positive for nausea, vomiting and abdominal pain.  Genitourinary: Negative for dysuria.  Musculoskeletal: Negative for myalgias.  Skin: Negative for rash.  Neurological: Negative for headaches.  Hematological: Does not bruise/bleed easily.  Psychiatric/Behavioral: Negative for confusion.      Allergies  Peanut-containing drug products; Strawberry extract; and Ultram  Home Medications   Prior to Admission medications   Medication Sig Start Date End Date Taking? Authorizing Provider  albuterol (PROVENTIL HFA;VENTOLIN HFA) 108 (90 BASE) MCG/ACT inhaler Inhale 2 puffs into the lungs every 6 (six) hours as needed for shortness of breath.    Historical Provider, MD  albuterol (PROVENTIL) (2.5 MG/3ML) 0.083% nebulizer solution Take 2.5 mg by nebulization every 6 (six) hours as needed for shortness of breath.    Historical Provider, MD  budesonide-formoterol (SYMBICORT) 160-4.5 MCG/ACT inhaler Inhale 2 puffs into the lungs daily.     Historical Provider, MD  EPINEPHrine (EPIPEN) 0.3 mg/0.3 mL SOAJ injection Inject 0.3 mg into the muscle once.  As needed for allergic reaction. Never had to use before    Historical Provider, MD  escitalopram (LEXAPRO) 10 MG tablet Take 5 mg by mouth daily. 06/20/15 06/19/16  Historical Provider, MD  insulin aspart (NOVOLOG FLEXPEN) 100 UNIT/ML FlexPen INJECT 0-20 UNITS INTO THE SKIN AS DIRECTED. SSI - CARB SCALE, 0-20 UNITS Patient taking differently: Inject 0-20 Units into the skin 4 (four) times daily as  needed for high blood sugar. Sliding scale 10/19/13   Elayne Snare, MD  Insulin Glargine (LANTUS SOLOSTAR) 100 UNIT/ML Solostar Pen Inject 24 Units into the skin daily at 10 pm. 05/25/15   Domenic Polite, MD  metoCLOPramide (REGLAN) 10 MG tablet Take 1 tablet (10 mg total) by mouth 3 (three) times daily before meals. 06/11/15   Shanker Kristeen Mans, MD  montelukast (SINGULAIR) 10 MG tablet Take 10 mg by mouth daily.    Historical Provider, MD  omeprazole (PRILOSEC) 40 MG capsule Take 1 capsule (40 mg total) by mouth daily. Patient not taking: Reported on 07/01/2015 04/23/15   Orson Eva, MD  ondansetron (ZOFRAN) 4 MG tablet Take 1 tablet (4 mg total) by mouth every 6 (six) hours. Patient taking differently: Take 4 mg by mouth every 8 (eight) hours as needed for nausea or vomiting.  04/23/15   Yalaha Lions, PA-C  promethazine (PHENERGAN) 12.5 MG suppository Place 1 suppository (12.5 mg total) rectally every 6 (six) hours as needed for nausea or vomiting. 07/06/15   Kelvin Cellar, MD  promethazine (PHENERGAN) 25 MG suppository Place 1 suppository (25 mg total) rectally every 6 (six) hours as needed for nausea or vomiting. 07/09/15   Fredia Sorrow, MD  promethazine (PHENERGAN) 25 MG tablet Take 1 tablet (25 mg total) by mouth every 6 (six) hours as needed for nausea or vomiting. 07/09/15   Fredia Sorrow, MD   BP 127/87 mmHg  Pulse 111  Temp(Src) 98.7 F (37.1 C) (Oral)  Resp 24  SpO2 97%  LMP 05/31/2015 (Approximate) Physical Exam  Constitutional: She is oriented to person, place, and time. She appears well-developed and well-nourished. She appears distressed.  HENT:  Head: Normocephalic and atraumatic.  Eyes: Conjunctivae and EOM are normal. Pupils are equal, round, and reactive to light.  Neck: Normal range of motion. Neck supple.  Cardiovascular: Normal rate and normal heart sounds.   No murmur heard. Pulmonary/Chest: Effort normal and breath sounds normal. No respiratory distress.   Abdominal: Soft. Bowel sounds are normal. There is no tenderness.  Musculoskeletal: Normal range of motion. She exhibits no edema.  Neurological: She is alert and oriented to person, place, and time. No cranial nerve deficit. She exhibits normal muscle tone. Coordination normal.  Skin: Skin is warm.  Nursing note and vitals reviewed.   ED Course  Procedures (including critical care time) Labs Review Labs Reviewed  COMPREHENSIVE METABOLIC PANEL - Abnormal; Notable for the following:    Glucose, Bld 108 (*)    Anion gap 16 (*)    All other components within normal limits  CBC - Abnormal; Notable for the following:    Platelets 492 (*)    All other components within normal limits  URINALYSIS, ROUTINE W REFLEX MICROSCOPIC (NOT AT [facility]) - Abnormal; Notable for the following:    APPearance CLOUDY (*)    Glucose, UA >1000 (*)    Ketones, ur >80 (*)    Protein, ur 30 (*)    All other components within normal limits  URINE MICROSCOPIC-ADD ON - Abnormal; Notable for the following:  Squamous Epithelial / LPF 6-30 (*)    Bacteria, UA FEW (*)    All other components within normal limits  LIPASE, BLOOD   Results for orders placed or performed during the hospital encounter of 07/09/15  Lipase, blood  Result Value Ref Range   Lipase 12 11 - 51 U/L  Comprehensive metabolic panel  Result Value Ref Range   Sodium 140 135 - 145 mmol/L   Potassium 3.5 3.5 - 5.1 mmol/L   Chloride 102 101 - 111 mmol/L   CO2 22 22 - 32 mmol/L   Glucose, Bld 108 (H) 65 - 99 mg/dL   BUN 15 6 - 20 mg/dL   Creatinine, Ser 0.55 0.44 - 1.00 mg/dL   Calcium 9.7 8.9 - 10.3 mg/dL   Total Protein 8.1 6.5 - 8.1 g/dL   Albumin 4.6 3.5 - 5.0 g/dL   AST 26 15 - 41 U/L   ALT 14 14 - 54 U/L   Alkaline Phosphatase 105 38 - 126 U/L   Total Bilirubin 1.0 0.3 - 1.2 mg/dL   GFR calc non Af Amer >60 >60 mL/min   GFR calc Af Amer >60 >60 mL/min   Anion gap 16 (H) 5 - 15  CBC  Result Value Ref Range   WBC 9.3 4.0 - 10.5  K/uL   RBC 4.65 3.87 - 5.11 MIL/uL   Hemoglobin 14.0 12.0 - 15.0 g/dL   HCT 40.8 36.0 - 46.0 %   MCV 87.7 78.0 - 100.0 fL   MCH 30.1 26.0 - 34.0 pg   MCHC 34.3 30.0 - 36.0 g/dL   RDW 13.6 11.5 - 15.5 %   Platelets 492 (H) 150 - 400 K/uL  Urinalysis, Routine w reflex microscopic  Result Value Ref Range   Color, Urine YELLOW YELLOW   APPearance CLOUDY (A) CLEAR   Specific Gravity, Urine 1.029 1.005 - 1.030   pH 8.0 5.0 - 8.0   Glucose, UA >1000 (A) NEGATIVE mg/dL   Hgb urine dipstick NEGATIVE NEGATIVE   Bilirubin Urine NEGATIVE NEGATIVE   Ketones, ur >80 (A) NEGATIVE mg/dL   Protein, ur 30 (A) NEGATIVE mg/dL   Nitrite NEGATIVE NEGATIVE   Leukocytes, UA NEGATIVE NEGATIVE  Urine microscopic-add on  Result Value Ref Range   Squamous Epithelial / LPF 6-30 (A) NONE SEEN   WBC, UA 0-5 0 - 5 WBC/hpf   RBC / HPF 0-5 0 - 5 RBC/hpf   Bacteria, UA FEW (A) NONE SEEN     Imaging Review No results found. I have personally reviewed and evaluated these images and lab results as part of my medical decision-making.   EKG Interpretation None      MDM   Final diagnoses:  Gastroparesis    Patient with known history of diabetes and gastroparesis. Today no evidence of diabetic ketoacidosis or any significant electrolyte abnormalities. Patient improved her significantly with Reglan and Haldol. Patient also received 2 L of normal saline. Patient with no further vomiting. Patient will be discharged home with her renewal for her Phenergan. Patient has primary care doctor follow-up.    Fredia Sorrow, MD 07/09/15 [id number]

## 2015-07-09 NOTE — ED Notes (Signed)
MD at bedside. 

## 2015-07-09 NOTE — ED Notes (Signed)
Pt is aware of urine sample 

## 2015-07-09 NOTE — Discharge Instructions (Signed)
Continue your Reglan supplement with the Phenergan either suppository or tablet as needed. Make an appointment to follow-up with your regular doctor. Return for any new or worse symptoms.    Gastroparesis Gastroparesis, also called delayed gastric emptying, is a condition in which food takes longer than normal to empty from the stomach. The condition is usually long-lasting (chronic). CAUSES This condition may be caused by:  An endocrine disorder, such as hypothyroidism or diabetes. Diabetes is the most common cause of this condition.  A nervous system disease, such as Parkinson disease or multiple sclerosis.  Cancer, infection, or surgery of the stomach or vagus nerve.  A connective tissue disorder, such as scleroderma.  Certain medicines. In most cases, the cause is not known. RISK FACTORS This condition is more likely to develop in:  People with certain disorders, including endocrine disorders, eating disorders, amyloidosis, and scleroderma.  People with certain diseases, including Parkinson disease or multiple sclerosis.  People with cancer or infection of the stomach or vagus nerve.  People who have had surgery on the stomach or vagus nerve.  People who take certain medicines.  Women. SYMPTOMS Symptoms of this condition include:  An early feeling of fullness when eating.  Nausea.  Weight loss.  Vomiting.  Heartburn.  Abdominal bloating.  Inconsistent blood glucose levels.  Lack of appetite.  Acid from the stomach coming up into the esophagus (gastroesophageal reflux).  Spasms of the stomach. Symptoms may come and go. DIAGNOSIS This condition is diagnosed with tests, such as:  Tests that check how long it takes food to move through the stomach and intestines. These tests include:  Upper gastrointestinal (GI) series. In this test, X-rays of the intestines are taken after you drink a liquid. The liquid makes the intestines show up better on the  X-rays.  Gastric emptying scintigraphy. In this test, scans are taken after you eat food that contains a small amount of radioactive material.  Wireless capsule GI monitoring system. This test involves swallowing a capsule that records information about movement through the stomach.  Gastric manometry. This test measures electrical and muscular activity in the stomach. It is done with a thin tube that is passed down the throat and into the stomach.  Endoscopy. This test checks for abnormalities in the lining of the stomach. It is done with a long, thin tube that is passed down the throat and into the stomach.  An ultrasound. This test can help rule out gallbladder disease or pancreatitis as a cause of your symptoms. It uses sound waves to take pictures of the inside of your body. TREATMENT There is no cure for gastroparesis. This condition may be managed with:  Treatment of the underlying condition causing the gastroparesis.  Lifestyle changes, including exercise and dietary changes. Dietary changes can include:  Changes in what and when you eat.  Eating smaller meals more often.  Eating low-fat foods.  Eating low-fiber forms of high-fiber foods, such as cooked vegetables instead of raw vegetables.  Having liquid foods in place of solid foods. Liquid foods are easier to digest.  Medicines. These may be given to control nausea and vomiting and to stimulate stomach muscles.  Getting food through a feeding tube. This may be done in severe cases.  A gastric neurostimulator. This is a device that is inserted into the body with surgery. It helps improve stomach emptying and control nausea and vomiting. HOME CARE INSTRUCTIONS  Follow your health care provider's instructions about exercise and diet.  Take medicines only  as directed by your health care provider. SEEK MEDICAL CARE IF:  Your symptoms do not improve with treatment.  You have new symptoms. SEEK IMMEDIATE MEDICAL CARE  IF:  You have severe abdominal pain that does not improve with treatment.  You have nausea that does not go away.  You cannot keep fluids down.   This information is not intended to replace advice given to you by your health care provider. Make sure you discuss any questions you have with your health care provider.   Document Released: 01/05/2005 Document Revised: 05/22/2014 Document Reviewed: 01/01/2014 Elsevier Interactive Patient Education [employer].

## 2015-07-09 NOTE — ED Notes (Signed)
Per EMS. Emesis since 0900. Hx of gastroparesis, has not taken home meds. Dry heaves mostly with EMS, with 70ml brown emesis. EMS gave 8mg  zofran and 372ml NS prior to arrival. Pt also reports diffuse abd pain. Hx of type 1 DM as well; cbg 92 with EMS.

## 2015-07-10 ENCOUNTER — Other Ambulatory Visit: Payer: Self-pay

## 2015-07-10 ENCOUNTER — Inpatient Hospital Stay (HOSPITAL_COMMUNITY)
Admission: EM | Admit: 2015-07-10 | Discharge: 2015-07-13 | DRG: 073 | Disposition: A | Discharge status: Home / Self Care | Payer: BLUE CROSS/BLUE SHIELD | Attending: Internal Medicine | Admitting: Internal Medicine

## 2015-07-10 ENCOUNTER — Encounter (HOSPITAL_COMMUNITY): Payer: Self-pay | Admitting: Emergency Medicine

## 2015-07-10 DIAGNOSIS — I5032 Chronic diastolic (congestive) heart failure: Secondary | ICD-10-CM | POA: Diagnosis present

## 2015-07-10 DIAGNOSIS — E1143 Type 2 diabetes mellitus with diabetic autonomic (poly)neuropathy: Secondary | ICD-10-CM | POA: Diagnosis not present

## 2015-07-10 DIAGNOSIS — Z681 Body mass index (BMI) 19 or less, adult: Secondary | ICD-10-CM

## 2015-07-10 DIAGNOSIS — E43 Unspecified severe protein-calorie malnutrition: Secondary | ICD-10-CM | POA: Diagnosis present

## 2015-07-10 DIAGNOSIS — E1065 Type 1 diabetes mellitus with hyperglycemia: Secondary | ICD-10-CM | POA: Diagnosis present

## 2015-07-10 DIAGNOSIS — J4520 Mild intermittent asthma, uncomplicated: Secondary | ICD-10-CM

## 2015-07-10 DIAGNOSIS — Z794 Long term (current) use of insulin: Secondary | ICD-10-CM

## 2015-07-10 DIAGNOSIS — E860 Dehydration: Secondary | ICD-10-CM | POA: Diagnosis present

## 2015-07-10 DIAGNOSIS — IMO0002 Reserved for concepts with insufficient information to code with codable children: Secondary | ICD-10-CM | POA: Diagnosis present

## 2015-07-10 DIAGNOSIS — F418 Other specified anxiety disorders: Secondary | ICD-10-CM | POA: Diagnosis not present

## 2015-07-10 DIAGNOSIS — E876 Hypokalemia: Secondary | ICD-10-CM | POA: Diagnosis present

## 2015-07-10 DIAGNOSIS — K3184 Gastroparesis: Secondary | ICD-10-CM | POA: Diagnosis present

## 2015-07-10 DIAGNOSIS — J45909 Unspecified asthma, uncomplicated: Secondary | ICD-10-CM | POA: Diagnosis present

## 2015-07-10 DIAGNOSIS — E1043 Type 1 diabetes mellitus with diabetic autonomic (poly)neuropathy: Principal | ICD-10-CM | POA: Diagnosis present

## 2015-07-10 DIAGNOSIS — Z9049 Acquired absence of other specified parts of digestive tract: Secondary | ICD-10-CM

## 2015-07-10 DIAGNOSIS — Z8249 Family history of ischemic heart disease and other diseases of the circulatory system: Secondary | ICD-10-CM

## 2015-07-10 DIAGNOSIS — K219 Gastro-esophageal reflux disease without esophagitis: Secondary | ICD-10-CM | POA: Diagnosis present

## 2015-07-10 DIAGNOSIS — Z833 Family history of diabetes mellitus: Secondary | ICD-10-CM

## 2015-07-10 DIAGNOSIS — R112 Nausea with vomiting, unspecified: Secondary | ICD-10-CM | POA: Diagnosis present

## 2015-07-10 LAB — CBG MONITORING, ED: Glucose-Capillary: 84 mg/dL (ref 65–99)

## 2015-07-10 LAB — COMPREHENSIVE METABOLIC PANEL
ALT: 13 U/L — ABNORMAL LOW (ref 14–54)
AST: 23 U/L (ref 15–41)
Albumin: 5 g/dL (ref 3.5–5.0)
Alkaline Phosphatase: 113 U/L (ref 38–126)
Anion gap: 14 (ref 5–15)
BUN: 13 mg/dL (ref 6–20)
CHLORIDE: 99 mmol/L — AB (ref 101–111)
CO2: 22 mmol/L (ref 22–32)
Calcium: 10.1 mg/dL (ref 8.9–10.3)
Creatinine, Ser: 0.61 mg/dL (ref 0.44–1.00)
Glucose, Bld: 117 mg/dL — ABNORMAL HIGH (ref 65–99)
POTASSIUM: 3.8 mmol/L (ref 3.5–5.1)
Sodium: 135 mmol/L (ref 135–145)
Total Bilirubin: 1.3 mg/dL — ABNORMAL HIGH (ref 0.3–1.2)
Total Protein: 8.7 g/dL — ABNORMAL HIGH (ref 6.5–8.1)

## 2015-07-10 LAB — CBC
HEMATOCRIT: 43.5 % (ref 36.0–46.0)
Hemoglobin: 15 g/dL (ref 12.0–15.0)
MCH: 30.4 pg (ref 26.0–34.0)
MCHC: 34.5 g/dL (ref 30.0–36.0)
MCV: 88.1 fL (ref 78.0–100.0)
PLATELETS: 562 10*3/uL — AB (ref 150–400)
RBC: 4.94 MIL/uL (ref 3.87–5.11)
RDW: 13.8 % (ref 11.5–15.5)
WBC: 7.6 10*3/uL (ref 4.0–10.5)

## 2015-07-10 LAB — URINALYSIS, ROUTINE W REFLEX MICROSCOPIC
Bilirubin Urine: NEGATIVE
Hgb urine dipstick: NEGATIVE
Ketones, ur: >80 mg/dL — AB
LEUKOCYTES UA: NEGATIVE
NITRITE: NEGATIVE
PROTEIN: NEGATIVE mg/dL
Specific Gravity, Urine: 1.031 — ABNORMAL HIGH (ref 1.005–1.030)
pH: 6 (ref 5.0–8.0)

## 2015-07-10 LAB — I-STAT BETA HCG BLOOD, ED (MC, WL, AP ONLY): I-stat hCG, quantitative: <5 m[IU]/mL (ref <5)

## 2015-07-10 LAB — GLUCOSE, CAPILLARY
GLUCOSE-CAPILLARY: 134 mg/dL — AB (ref 65–99)
Glucose-Capillary: 78 mg/dL (ref 65–99)

## 2015-07-10 LAB — RAPID URINE DRUG SCREEN, HOSP PERFORMED
Amphetamines: NOT DETECTED
Barbiturates: NOT DETECTED
Benzodiazepines: NOT DETECTED
Cocaine: NOT DETECTED
Opiates: NOT DETECTED
Tetrahydrocannabinol: POSITIVE — AB

## 2015-07-10 LAB — URINE MICROSCOPIC-ADD ON

## 2015-07-10 LAB — LIPASE, BLOOD: LIPASE: 14 U/L (ref 11–51)

## 2015-07-10 MED ORDER — EPINEPHRINE 0.3 MG/0.3ML IJ SOAJ: 0.3000 mg | INTRAMUSCULAR | Status: DC | PRN

## 2015-07-10 MED ORDER — METOCLOPRAMIDE HCL 5 MG/ML IJ SOLN
10.0000 mg | Freq: Three times a day (TID) | INTRAMUSCULAR | Status: DC
  Administered 2015-07-10 – 2015-07-13 (×8): 10 mg via INTRAVENOUS
  Filled 2015-07-10 (×8): qty 2

## 2015-07-10 MED ORDER — PROMETHAZINE HCL 25 MG/ML IJ SOLN
12.5000 mg | Freq: Four times a day (QID) | INTRAMUSCULAR | Status: DC | PRN
  Administered 2015-07-10 – 2015-07-12 (×7): 12.5 mg via INTRAVENOUS
  Filled 2015-07-10 (×10): qty 1

## 2015-07-10 MED ORDER — SODIUM CHLORIDE 0.9 % IV BOLUS (SEPSIS)
1000.0000 mL | Freq: Once | INTRAVENOUS | Status: AC
  Administered 2015-07-10: 1000 mL via INTRAVENOUS

## 2015-07-10 MED ORDER — SODIUM CHLORIDE 0.9% FLUSH: 3.0000 mL | Freq: Two times a day (BID) | INTRAVENOUS | Status: DC

## 2015-07-10 MED ORDER — INSULIN ASPART 100 UNIT/ML ~~LOC~~ SOLN
0.0000 [IU] | Freq: Three times a day (TID) | SUBCUTANEOUS | Status: DC
  Administered 2015-07-11: 3 [IU] via SUBCUTANEOUS
  Administered 2015-07-11: 7 [IU] via SUBCUTANEOUS
  Administered 2015-07-11: 3 [IU] via SUBCUTANEOUS
  Administered 2015-07-12: 2 [IU] via SUBCUTANEOUS
  Administered 2015-07-12 (×2): 3 [IU] via SUBCUTANEOUS
  Administered 2015-07-13: 1 [IU] via SUBCUTANEOUS

## 2015-07-10 MED ORDER — ESCITALOPRAM OXALATE 10 MG PO TABS
5.0000 mg | ORAL_TABLET | Freq: Every day | ORAL | Status: DC
  Administered 2015-07-11 – 2015-07-13 (×2): 5 mg via ORAL
  Filled 2015-07-10 (×3): qty 1

## 2015-07-10 MED ORDER — MOMETASONE FURO-FORMOTEROL FUM 200-5 MCG/ACT IN AERO
2.0000 | INHALATION_SPRAY | Freq: Two times a day (BID) | RESPIRATORY_TRACT | Status: DC
  Administered 2015-07-11: 2 via RESPIRATORY_TRACT
  Filled 2015-07-10: qty 8.8

## 2015-07-10 MED ORDER — ENOXAPARIN SODIUM 40 MG/0.4ML ~~LOC~~ SOLN
40.0000 mg | SUBCUTANEOUS | Status: DC
  Administered 2015-07-10 – 2015-07-12 (×3): 40 mg via SUBCUTANEOUS
  Filled 2015-07-10 (×3): qty 0.4

## 2015-07-10 MED ORDER — ONDANSETRON HCL 4 MG/2ML IJ SOLN
4.0000 mg | Freq: Three times a day (TID) | INTRAMUSCULAR | Status: DC | PRN
  Administered 2015-07-11 – 2015-07-12 (×2): 4 mg via INTRAVENOUS
  Filled 2015-07-10 (×2): qty 2

## 2015-07-10 MED ORDER — METOCLOPRAMIDE HCL 5 MG/ML IJ SOLN
10.0000 mg | Freq: Once | INTRAMUSCULAR | Status: AC
  Administered 2015-07-10: 10 mg via INTRAVENOUS
  Filled 2015-07-10: qty 2

## 2015-07-10 MED ORDER — KETOROLAC TROMETHAMINE 30 MG/ML IJ SOLN
30.0000 mg | Freq: Once | INTRAMUSCULAR | Status: AC
  Administered 2015-07-10: 30 mg via INTRAVENOUS
  Filled 2015-07-10: qty 1

## 2015-07-10 MED ORDER — SODIUM CHLORIDE 0.9 % IV SOLN
INTRAVENOUS | Status: DC
  Administered 2015-07-10 – 2015-07-12 (×4): via INTRAVENOUS

## 2015-07-10 MED ORDER — HALOPERIDOL LACTATE 5 MG/ML IJ SOLN
4.0000 mg | Freq: Once | INTRAMUSCULAR | Status: AC
  Administered 2015-07-10: 4 mg via INTRAVENOUS
  Filled 2015-07-10: qty 1

## 2015-07-10 MED ORDER — HALOPERIDOL LACTATE 5 MG/ML IJ SOLN
4.0000 mg | Freq: Once | INTRAMUSCULAR | Status: AC
Start: 2015-07-10 — End: 2015-07-10
  Administered 2015-07-10: 4 mg via INTRAVENOUS
  Filled 2015-07-10: qty 1

## 2015-07-10 MED ORDER — FAMOTIDINE IN NACL 20-0.9 MG/50ML-% IV SOLN
20.0000 mg | Freq: Two times a day (BID) | INTRAVENOUS | Status: DC
  Administered 2015-07-10 – 2015-07-13 (×6): 20 mg via INTRAVENOUS
  Filled 2015-07-10 (×6): qty 50

## 2015-07-10 MED ORDER — ALBUTEROL SULFATE (2.5 MG/3ML) 0.083% IN NEBU: 2.5000 mg | INHALATION_SOLUTION | RESPIRATORY_TRACT | Status: DC | PRN

## 2015-07-10 MED ORDER — DEXTROSE-NACL 5-0.45 % IV SOLN
INTRAVENOUS | Status: DC
  Administered 2015-07-10: 18:00:00 via INTRAVENOUS
  Administered 2015-07-11: 1 mL via INTRAVENOUS

## 2015-07-10 MED ORDER — ACETAMINOPHEN 650 MG RE SUPP: 650.0000 mg | Freq: Four times a day (QID) | RECTAL | Status: DC | PRN

## 2015-07-10 MED ORDER — INSULIN GLARGINE 100 UNIT/ML ~~LOC~~ SOLN
12.0000 [IU] | Freq: Every day | SUBCUTANEOUS | Status: DC
  Administered 2015-07-10 – 2015-07-11 (×2): 12 [IU] via SUBCUTANEOUS
  Filled 2015-07-10 (×3): qty 0.12

## 2015-07-10 MED ORDER — ENSURE ENLIVE PO LIQD: 237.0000 mL | Freq: Two times a day (BID) | ORAL | Status: DC

## 2015-07-10 MED ORDER — ACETAMINOPHEN 325 MG PO TABS: 650.0000 mg | ORAL_TABLET | Freq: Four times a day (QID) | ORAL | Status: DC | PRN

## 2015-07-10 MED ORDER — MORPHINE SULFATE (PF) 2 MG/ML IV SOLN
2.0000 mg | INTRAVENOUS | Status: DC | PRN
  Administered 2015-07-10 – 2015-07-11 (×5): 2 mg via INTRAVENOUS
  Filled 2015-07-10 (×5): qty 1

## 2015-07-10 MED ORDER — MONTELUKAST SODIUM 10 MG PO TABS
10.0000 mg | ORAL_TABLET | Freq: Every day | ORAL | Status: DC
  Administered 2015-07-11 – 2015-07-13 (×2): 10 mg via ORAL
  Filled 2015-07-10 (×3): qty 1

## 2015-07-10 NOTE — H&P (Signed)
History and Physical    Chrysti Traum [id number] DOB: Mar 16, 1990 DOA: 07/10/2015  Referring MD/NP/PA:   PCP: Vicenta Aly, FNP   Patient coming from:  The patient is coming from home.  At baseline, pt is independent for herADL.       Chief Complaint: Intractable nausea, vomiting and abdominal pain  HPI: Virlie Zeng is a 25 y.o. female with medical history significant of poorly controlled type 1 diabetes, gastroparesis, hypertension, GERD, depression, anxiety, diastolic congestive heart failure, pancreatitis, asthma, who presents with intractable nausea, vomiting and abdominal pain.  Patient reports that she started having severe nausea, vomiting, abdominal pain sinceyesterday, which has been progressively getting worse. She does not have diarrhea. She has vomited more than 15 times without blood in the vomitus today. She has diffused abdominal pain, constant, 10 out of 10 in severity, nonradiating. No fever, chills. Patient denies chest pain, shortness breath, cough, symptoms of UTI or unilateral weakness. She has a past history of diabetic gastroparesis. She states her current symptoms feel similar to this. Not improved with her home medications. Says she could not keep anything down.  ED Course: pt was found to have a negative urinalysis, WBC 7.6, negative pregnancy test, lipase 14, tachycardia, tachypnea, temperature normal, electrolytes and renal function okay. Patient is placed on telemetry bed for observation.  Review of Systems:   General: no fevers, chills, no changes in body weight, has poor appetite, has fatigue HEENT: no blurry vision, hearing changes or sore throat Pulm: no dyspnea, coughing, wheezing CV: no chest pain, no palpitations Abd: has nausea, vomiting, abdominal pain, no diarrhea, constipation GU: no dysuria, burning on urination, increased urinary frequency, hematuria  Ext: no leg edema Neuro: no unilateral weakness, numbness, or tingling, no  vision change or hearing loss Skin: no rash MSK: No muscle spasm, no deformity, no limitation of range of movement in spin Heme: No easy bruising.  Travel history: No recent long distant travel.  Allergy:  Allergies  Allergen Reactions  . Peanut-Containing Drug Products Swelling    Swelling of mouth, lips  . Strawberry Extract Swelling    Swelling of mouth, lips  . Ultram [Tramadol] Itching    Past Medical History  Diagnosis Date  . Diabetes mellitus 2007    IDDM.  poorly controlled, multiple admits with DKA  . Asthma   . Pancreatitis, acute 11/26/2014  . Hepatic steatosis 11/26/2014    and hepatomegaly  . Liver mass 11/26/2014  . Hypertension     NOT CURRENTLY ON ANY BP MED  . Heart murmur   . Arthritis   . Gallstones   . Anxiety     Past Surgical History  Procedure Laterality Date  . Wisdom tooth extraction    . Esophagogastroduodenoscopy (egd) with propofol Left 09/20/2014    Procedure: ESOPHAGOGASTRODUODENOSCOPY (EGD) WITH PROPOFOL;  Surgeon: Arta Silence, MD;  [facility] END[facility]COPY;  Service: Endoscopy;  Laterality: Left;  . Cholecystectomy N/A 02/11/2015    Procedure: LAPAROSCOPIC CHOLECYSTECTOMY WITH INTRAOPERATIVE CHOLANGIOGRAM;  Surgeon: Greer Pickerel, MD;  Location: [facility] ORS;  Service: General;  Laterality: N/A;    Social History:  reports that she has never smoked. She has never used smokeless tobacco. She reports that she uses illicit drugs (Marijuana). She reports that she does not drink alcohol.  Family History:  Family History  Problem Relation Age of Onset  . Diabetes Mother   . Hyperlipidemia Mother   . Hypertension Father   . Heart disease Father   . Heart disease Maternal Grandmother   .  Heart disease Maternal Grandfather   . Hypertension Paternal Grandmother   . Cancer Paternal Grandfather      Prior to Admission medications   Medication Sig Start Date End Date Taking? Authorizing Provider  albuterol (PROVENTIL HFA;VENTOLIN HFA) 108 (90 BASE)  MCG/ACT inhaler Inhale 2 puffs into the lungs every 6 (six) hours as needed for shortness of breath.    Historical Provider, MD  albuterol (PROVENTIL) (2.5 MG/3ML) 0.083% nebulizer solution Take 2.5 mg by nebulization every 6 (six) hours as needed for shortness of breath.    Historical Provider, MD  budesonide-formoterol (SYMBICORT) 160-4.5 MCG/ACT inhaler Inhale 2 puffs into the lungs daily.     Historical Provider, MD  EPINEPHrine (EPIPEN) 0.3 mg/0.3 mL SOAJ injection Inject 0.3 mg into the muscle once. As needed for allergic reaction. Never had to use before    Historical Provider, MD  escitalopram (LEXAPRO) 10 MG tablet Take 5 mg by mouth daily. 06/20/15 06/19/16  Historical Provider, MD  insulin aspart (NOVOLOG FLEXPEN) 100 UNIT/ML FlexPen INJECT 0-20 UNITS INTO THE SKIN AS DIRECTED. SSI - CARB SCALE, 0-20 UNITS Patient taking differently: Inject 0-20 Units into the skin 4 (four) times daily as needed for high blood sugar. Sliding scale 10/19/13   Elayne Snare, MD  Insulin Glargine (LANTUS SOLOSTAR) 100 UNIT/ML Solostar Pen Inject 24 Units into the skin daily at 10 pm. 05/25/15   Domenic Polite, MD  metoCLOPramide (REGLAN) 10 MG tablet Take 1 tablet (10 mg total) by mouth 3 (three) times daily before meals. 06/11/15   Shanker Kristeen Mans, MD  montelukast (SINGULAIR) 10 MG tablet Take 10 mg by mouth daily.    Historical Provider, MD  omeprazole (PRILOSEC) 40 MG capsule Take 1 capsule (40 mg total) by mouth daily. Patient not taking: Reported on 07/01/2015 04/23/15   Orson Eva, MD  ondansetron (ZOFRAN) 4 MG tablet Take 1 tablet (4 mg total) by mouth every 6 (six) hours. Patient taking differently: Take 4 mg by mouth every 8 (eight) hours as needed for nausea or vomiting.  04/23/15   [place], PA-C  promethazine (PHENERGAN) 12.5 MG suppository Place 1 suppository (12.5 mg total) rectally every 6 (six) hours as needed for nausea or vomiting. 07/06/15   Kelvin Cellar, MD  promethazine (PHENERGAN) 25 MG  suppository Place 1 suppository (25 mg total) rectally every 6 (six) hours as needed for nausea or vomiting. 07/09/15   Fredia Sorrow, MD  promethazine (PHENERGAN) 25 MG tablet Take 1 tablet (25 mg total) by mouth every 6 (six) hours as needed for nausea or vomiting. 07/09/15   Fredia Sorrow, MD    Physical Exam: Filed Vitals:   07/10/15 1530 07/10/15 1600 07/10/15 1630 07/10/15 1726  BP: 128/87 143/95 144/96 131/86  Pulse: 120 125 120 115  Temp:    98.8 F (37.1 C)  TempSrc:    Oral  Resp: 33 15 16 16   Height:    5\' 4"  (1.626 m)  Weight:    47.673 kg (105 lb 1.6 oz)  SpO2: 100% 100% 100% 100%   General: In moderate acute distress. Dry mucous membranes. HEENT:       Eyes: PERRL, EOMI, no scleral icterus.       ENT: No discharge from the ears and nose, no pharynx injection, no tonsillar enlargement.        Neck: No JVD, no bruit, no mass felt. Heme: No neck lymph node enlargement. Cardiac: S1/S2, RRR, No murmurs, No gallops or rubs. Pulm: No rales, wheezing, rhonchi  or rubs. Abd: Soft, nondistended, diffused tenderness, no rebound pain, no organomegaly, BS present. GU: No hematuria Ext: No pitting leg edema bilaterally. 2+DP/PT pulse bilaterally. Musculoskeletal: No joint deformities, No joint redness or warmth, no limitation of ROM in spin. Skin: No rashes.  Neuro: Alert, oriented X3, cranial nerves II-XII grossly intact, moves all extremities normally. Psych: Patient is not psychotic, no suicidal or hemocidal ideation.  Labs on Admission: I have personally reviewed following labs and imaging studies  CBC:  Recent Labs Lab 07/04/15 0250 07/05/15 0304 07/09/15 1354 07/10/15 1235  WBC 9.7 8.1 9.3 7.6  HGB 11.7* 12.4 14.0 15.0  HCT 34.2* 35.1* 40.8 43.5  MCV 88.6 84.8 87.7 88.1  PLT 403* 337 492* [id number]*   Basic Metabolic Panel:  Recent Labs Lab 07/04/15 0250 07/04/15 0652 07/05/15 0304 07/09/15 1354 07/10/15 1235  NA 136 138 137 140 135  K 2.9* 3.3* 3.0* 3.5  3.8  CL 108 108 101 102 99*  CO2 18* 20* 24 22 22   GLUCOSE 249* 154* 317* 108* 117*  BUN <5* <5* <5* 15 13  CREATININE 0.48 0.41* 0.45 0.55 0.61  CALCIUM 7.9* 8.0* 8.6* 9.7 10.1   GFR: Estimated Creatinine Clearance: 80.9 mL/min (by C-G formula based on Cr of 0.61). Liver Function Tests:  Recent Labs Lab 07/09/15 1354 07/10/15 1235  AST 26 23  ALT 14 13*  ALKPHOS 105 113  BILITOT 1.0 1.3*  PROT 8.1 8.7*  ALBUMIN 4.6 5.0    Recent Labs Lab 07/09/15 1354 07/10/15 1235  LIPASE 12 14   No results for input(s): AMMONIA in the last 168 hours. Coagulation Profile: No results for input(s): INR, PROTIME in the last 168 hours. Cardiac Enzymes: No results for input(s): CKTOTAL, CKMB, CKMBINDEX, TROPONINI in the last 168 hours. BNP (last 3 results) No results for input(s): PROBNP in the last 8760 hours. HbA1C: No results for input(s): HGBA1C in the last 72 hours. CBG:  Recent Labs Lab 07/06/15 0454 07/06/15 0811 07/06/15 1210 07/10/15 1655 07/10/15 1737  GLUCAP 127* 103* 289* 84 78   Lipid Profile: No results for input(s): CHOL, HDL, LDLCALC, TRIG, CHOLHDL, LDLDIRECT in the last 72 hours. Thyroid Function Tests: No results for input(s): TSH, T4TOTAL, FREET4, T3FREE, THYROIDAB in the last 72 hours. Anemia Panel: No results for input(s): VITAMINB12, FOLATE, FERRITIN, TIBC, IRON, RETICCTPCT in the last 72 hours. Urine analysis:    Component Value Date/Time   COLORURINE YELLOW 07/10/2015 1552   APPEARANCEUR CLOUDY* 07/10/2015 1552   LABSPEC 1.031* 07/10/2015 1552   PHURINE 6.0 07/10/2015 1552   GLUCOSEU >1000* 07/10/2015 1552   GLUCOSEU >=1000 11/07/2012 1205   HGBUR NEGATIVE 07/10/2015 1552   BILIRUBINUR NEGATIVE 07/10/2015 1552   KETONESUR >80* 07/10/2015 1552   PROTEINUR NEGATIVE 07/10/2015 1552   UROBILINOGEN 0.2 11/24/2014 1045   NITRITE NEGATIVE 07/10/2015 1552   LEUKOCYTESUR NEGATIVE 07/10/2015 1552   Sepsis  Labs: @LABRCNTIP (procalcitonin:4,lacticidven:4) ) Recent Results (from the past 240 hour(s))  MRSA PCR Screening     Status: None   Collection Time: 07/01/15 11:59 PM  Result Value Ref Range Status   MRSA by PCR NEGATIVE NEGATIVE Final    Comment:        The GeneXpert MRSA Assay (FDA approved for NASAL specimens only), is one component of a comprehensive MRSA colonization surveillance program. It is not intended to diagnose MRSA infection nor to guide or monitor treatment for MRSA infections.      Radiological Exams on Admission: No results found.   EKG:  Not done in ED, will get one.   Assessment/Plan Principal Problem:   Diabetic gastroparesis (HCC) Active Problems:   Uncontrolled type 1 diabetes mellitus (HCC)   Asthma   Nausea and vomiting   Chronic diastolic heart failure (HCC)   Protein-calorie malnutrition, severe   Gastroparesis   GERD (gastroesophageal reflux disease)   Depression with anxiety   Flare up of diabetic gastroparesis [facility]): Patient's intractable nausea, vomiting, abdominal pain are most likely caused by gastroparesis flareup. She has a poorly controlled diabetes with recent A1c 9.25/6/17. Lipase 14, negative urinalysis, negative pregnancy test. Patient is clinically dehydrated, hemodynamically stable.  -place on tele bed for obs -Scheduled Reglan 10 mg 3 times a day - when necessary Phenergan -When necessary morphine for pain (patient cannot tolerate oral pain medication) -When necessary Zofran for nausea and vomiting -IVF: 3L NS and then 75 cc/h-->due to her blood sugar is 70 ~80, changed to D5-1/2NS at 75 cc/h  DM-II: Last A1c 9.2 on 05/25/15, poorly controled. Patient is taking Lantus at home -will decrease Lantus dose from 24-->12 units daily  -SSI -Consult to diabetic educator  Asthma: stable.. No wheezing or rhonchi on auscultation. -continue when necessary albuterol nebulizer -Dulera inhaler  Chronic diastolic heart failure ([place]):  2-D echo 05/24/14 showed EF of 65-7% discreet 1 diastolic dysfunction. Patient is not on diuretics at home. Patient is clinically dehydrated on admission. -Check BNP -Water volume status closely while patient is receiving IV fluid treatment.  -Protein-calorie malnutrition, severe: -Consult nutrition  GERD (gastroesophageal reflux disease) -Pepcid IV  Depression with anxiety: stable. No SI or HI ideation. -continue Lexapro    DVT ppx: SQ Lovenox Code Status: Full code Family Communication: None at bed side.  Disposition Plan:  Anticipate discharge back to previous home environment Consults called:  none Admission status: Obs / tele   Date of Service 07/10/2015    Ivor Costa [facility] Pager [phone]  If 7PM-7AM, please contact night-coverage [email or web address] Password [facility] 07/10/2015, 5:47 PM

## 2015-07-10 NOTE — ED Notes (Signed)
Unsuccessful blood collection to left [facility]

## 2015-07-10 NOTE — ED Notes (Addendum)
Pt having flare up of gastroparesis. Was here yesterday for same. Tried home medications with no relief. Dry heaving in triage. CBG in 100s at home

## 2015-07-10 NOTE — ED Provider Notes (Signed)
CSN: [id number]     Arrival date & time 07/10/15  1207 History   First MD Initiated Contact with Patient 07/10/15 1300     Chief Complaint  Patient presents with  . Emesis    HPI Comments: 25 year old female presents with abdominal pain, nausea, vomiting. Past medical history significant for diabetes and gastroparesis. Patient states that she is having symptoms from her gastroparesis which is typical for her. She was last seen yesterday for the same. She was given Reglan, Haldol, 2 L of IV fluids and had improvement in symptoms and was discharged home as she was not showing any signs of being in DKA. She was discharged home with a Phenergan suppository. Today she is presenting for same symptoms she states that her symptoms never improved when she went home. He has been taking the Phenergan suppository and Reglan without relief. She has not been able to keep anything down. Denies fever, chills, chest pain, shortness of breath, diarrhea, constipation, dysuria.  Patient is a 25 y.o. female presenting with vomiting.  Emesis Associated symptoms: abdominal pain   Associated symptoms: no chills and no diarrhea     Past Medical History  Diagnosis Date  . Diabetes mellitus 2007    IDDM.  poorly controlled, multiple admits with DKA  . Asthma   . Pancreatitis, acute 11/26/2014  . Hepatic steatosis 11/26/2014    and hepatomegaly  . Liver mass 11/26/2014  . Hypertension     NOT CURRENTLY ON ANY BP MED  . Heart murmur   . Arthritis   . Gallstones   . Anxiety    Past Surgical History  Procedure Laterality Date  . Wisdom tooth extraction    . Esophagogastroduodenoscopy (egd) with propofol Left 09/20/2014    Procedure: ESOPHAGOGASTRODUODENOSCOPY (EGD) WITH PROPOFOL;  Surgeon: Arta Silence, MD;  Location: [facility] ENDOSCOPY;  Service: Endoscopy;  Laterality: Left;  . Cholecystectomy N/A 02/11/2015    Procedure: LAPAROSCOPIC CHOLECYSTECTOMY WITH INTRAOPERATIVE CHOLANGIOGRAM;  Surgeon: Greer Pickerel, MD;   Location: [facility] ORS;  Service: General;  Laterality: N/A;   Family History  Problem Relation Age of Onset  . Diabetes Mother   . Hyperlipidemia Mother   . Hypertension Father   . Heart disease Father   . Heart disease Maternal Grandmother   . Heart disease Maternal Grandfather   . Hypertension Paternal Grandmother   . Cancer Paternal Grandfather    Social History  Substance Use Topics  . Smoking status: Never Smoker   . Smokeless tobacco: Never Used  . Alcohol Use: No   OB History    Gravida Para Term Preterm AB TAB SAB Ectopic Multiple Living   2 1 0 1 1 1 0 0 0 1      Review of Systems  Constitutional: Negative for fever and chills.  Gastrointestinal: Positive for nausea, vomiting and abdominal pain. Negative for diarrhea.  All other systems reviewed and are negative.  Allergies  Peanut-containing drug products; Strawberry extract; and Ultram  Home Medications   Prior to Admission medications   Medication Sig Start Date End Date Taking? Authorizing Provider  albuterol (PROVENTIL HFA;VENTOLIN HFA) 108 (90 BASE) MCG/ACT inhaler Inhale 2 puffs into the lungs every 6 (six) hours as needed for shortness of breath.    Historical Provider, MD  albuterol (PROVENTIL) (2.5 MG/3ML) 0.083% nebulizer solution Take 2.5 mg by nebulization every 6 (six) hours as needed for shortness of breath.    Historical Provider, MD  budesonide-formoterol (SYMBICORT) 160-4.5 MCG/ACT inhaler Inhale 2 puffs into the lungs daily.  Historical Provider, MD  EPINEPHrine (EPIPEN) 0.3 mg/0.3 mL SOAJ injection Inject 0.3 mg into the muscle once. As needed for allergic reaction. Never had to use before    Historical Provider, MD  escitalopram (LEXAPRO) 10 MG tablet Take 5 mg by mouth daily. 06/20/15 06/19/16  Historical Provider, MD  insulin aspart (NOVOLOG FLEXPEN) 100 UNIT/ML FlexPen INJECT 0-20 UNITS INTO THE SKIN AS DIRECTED. SSI - CARB SCALE, 0-20 UNITS Patient taking differently: Inject 0-20 Units into the  skin 4 (four) times daily as needed for high blood sugar. Sliding scale 10/19/13   Elayne Snare, MD  Insulin Glargine (LANTUS SOLOSTAR) 100 UNIT/ML Solostar Pen Inject 24 Units into the skin daily at 10 pm. 05/25/15   Domenic Polite, MD  metoCLOPramide (REGLAN) 10 MG tablet Take 1 tablet (10 mg total) by mouth 3 (three) times daily before meals. 06/11/15   Shanker Kristeen Mans, MD  montelukast (SINGULAIR) 10 MG tablet Take 10 mg by mouth daily.    Historical Provider, MD  omeprazole (PRILOSEC) 40 MG capsule Take 1 capsule (40 mg total) by mouth daily. Patient not taking: Reported on 07/01/2015 04/23/15   Orson Eva, MD  ondansetron (ZOFRAN) 4 MG tablet Take 1 tablet (4 mg total) by mouth every 6 (six) hours. Patient taking differently: Take 4 mg by mouth every 8 (eight) hours as needed for nausea or vomiting.  04/23/15   [place] Lions, PA-C  promethazine (PHENERGAN) 12.5 MG suppository Place 1 suppository (12.5 mg total) rectally every 6 (six) hours as needed for nausea or vomiting. 07/06/15   Kelvin Cellar, MD  promethazine (PHENERGAN) 25 MG suppository Place 1 suppository (25 mg total) rectally every 6 (six) hours as needed for nausea or vomiting. 07/09/15   Fredia Sorrow, MD  promethazine (PHENERGAN) 25 MG tablet Take 1 tablet (25 mg total) by mouth every 6 (six) hours as needed for nausea or vomiting. 07/09/15   Fredia Sorrow, MD   BP 138/86 mmHg  Pulse 122  Temp(Src) 98 F (36.7 C) (Oral)  Resp 23  SpO2 100%  LMP 05/31/2015 (Approximate)   Physical Exam  Constitutional: She is oriented to person, place, and time. She appears well-developed and well-nourished. She appears distressed.  HENT:  Head: Normocephalic and atraumatic.  Eyes: Conjunctivae are normal. Pupils are equal, round, and reactive to light. Right eye exhibits no discharge. Left eye exhibits no discharge. No scleral icterus.  Neck: Normal range of motion.  Cardiovascular: Regular rhythm.  Tachycardia present.  Exam reveals no  gallop and no friction rub.   No murmur heard. Pulmonary/Chest: Effort normal and breath sounds normal. No respiratory distress. She has no wheezes. She has no rales. She exhibits no tenderness.  Abdominal: Soft. Bowel sounds are normal. She exhibits no distension and no mass. There is tenderness. There is no rebound and no guarding.  Generalized tenderness - mostly in epigastrium  Neurological: She is alert and oriented to person, place, and time.  Normal gait  Skin: Skin is warm and dry. She is not diaphoretic.  Psychiatric: She has a normal mood and affect. Her behavior is normal.    ED Course  Procedures (including critical care time) Labs Review Labs Reviewed  COMPREHENSIVE METABOLIC PANEL - Abnormal; Notable for the following:    Chloride 99 (*)    Glucose, Bld 117 (*)    Total Protein 8.7 (*)    ALT 13 (*)    Total Bilirubin 1.3 (*)    All other components within normal limits  CBC -  Abnormal; Notable for the following:    Platelets 562 (*)    All other components within normal limits  LIPASE, BLOOD  URINALYSIS, ROUTINE W REFLEX MICROSCOPIC (NOT AT [facility])  I-STAT BETA HCG BLOOD, ED ([facility], [facility], [facility] ONLY)    Imaging Review No results found. I have personally reviewed and evaluated these images and lab results as part of my medical decision-making.   EKG Interpretation None      MDM   Final diagnoses:  Gastroparesis due to DM [facility])   25 year old female presents with abdominal pain, nausea, vomiting consistent with gastroparesis flareup. She has been persistently tachycardic here in the ED. She has received Reglan and Haldol 2 with no relief per patient. 2 L of IV fluids given. Patient does not appear to be in DKA. No Anion gap. Glucose is 117. UA is pending due to patient being unable to provide sample. CBC remarkable for platelet count of 562. She has been dry heaving in the room and has a small amount of brown emesis in the emesis bag. Due to persistent symptoms and  vital signs without abnormalities will consult for admission. Spoke with Dr. Blaine Hamper who will place patient in obs tele bed.   Recardo Evangelist, PA-C 07/10/15 [place] Liu, MD 07/10/15 [phone]

## 2015-07-11 DIAGNOSIS — K3184 Gastroparesis: Secondary | ICD-10-CM | POA: Diagnosis not present

## 2015-07-11 DIAGNOSIS — R1110 Vomiting, unspecified: Secondary | ICD-10-CM | POA: Diagnosis not present

## 2015-07-11 DIAGNOSIS — E43 Unspecified severe protein-calorie malnutrition: Secondary | ICD-10-CM | POA: Diagnosis not present

## 2015-07-11 DIAGNOSIS — E1143 Type 2 diabetes mellitus with diabetic autonomic (poly)neuropathy: Secondary | ICD-10-CM | POA: Diagnosis not present

## 2015-07-11 LAB — CBC
HCT: 34.8 % — ABNORMAL LOW (ref 36.0–46.0)
HEMOGLOBIN: 11.7 g/dL — AB (ref 12.0–15.0)
MCH: 30.1 pg (ref 26.0–34.0)
MCHC: 33.6 g/dL (ref 30.0–36.0)
MCV: 89.5 fL (ref 78.0–100.0)
PLATELETS: 432 10*3/uL — AB (ref 150–400)
RBC: 3.89 MIL/uL (ref 3.87–5.11)
RDW: 13.9 % (ref 11.5–15.5)
WBC: 5.7 10*3/uL (ref 4.0–10.5)

## 2015-07-11 LAB — BASIC METABOLIC PANEL
ANION GAP: 7 (ref 5–15)
BUN: 8 mg/dL (ref 6–20)
CALCIUM: 8.5 mg/dL — AB (ref 8.9–10.3)
CO2: 22 mmol/L (ref 22–32)
Chloride: 105 mmol/L (ref 101–111)
Creatinine, Ser: 0.39 mg/dL — ABNORMAL LOW (ref 0.44–1.00)
Glucose, Bld: 208 mg/dL — ABNORMAL HIGH (ref 65–99)
Potassium: 3.4 mmol/L — ABNORMAL LOW (ref 3.5–5.1)
Sodium: 134 mmol/L — ABNORMAL LOW (ref 135–145)

## 2015-07-11 LAB — GLUCOSE, CAPILLARY
GLUCOSE-CAPILLARY: 218 mg/dL — AB (ref 65–99)
Glucose-Capillary: 329 mg/dL — ABNORMAL HIGH (ref 65–99)
Glucose-Capillary: 224 mg/dL — ABNORMAL HIGH (ref 65–99)
Glucose-Capillary: 178 mg/dL — ABNORMAL HIGH (ref 65–99)

## 2015-07-11 LAB — BRAIN NATRIURETIC PEPTIDE: B Natriuretic Peptide: 6.9 pg/mL (ref 0.0–100.0)

## 2015-07-11 MED ORDER — MORPHINE SULFATE (PF) 2 MG/ML IV SOLN
1.0000 mg | Freq: Once | INTRAVENOUS | Status: AC
  Administered 2015-07-11: 1 mg via INTRAVENOUS

## 2015-07-11 MED ORDER — MORPHINE SULFATE (PF) 2 MG/ML IV SOLN
1.0000 mg | INTRAVENOUS | Status: DC | PRN
  Administered 2015-07-11 – 2015-07-12 (×5): 1 mg via INTRAVENOUS
  Filled 2015-07-11 (×6): qty 1

## 2015-07-11 MED ORDER — METOCLOPRAMIDE HCL 5 MG/ML IJ SOLN
5.0000 mg | Freq: Once | INTRAMUSCULAR | Status: AC
  Administered 2015-07-11: 5 mg via INTRAVENOUS
  Filled 2015-07-11: qty 2

## 2015-07-11 NOTE — Progress Notes (Signed)
Inpatient Diabetes Program Recommendations  AACE/ADA: New Consensus Statement on Inpatient Glycemic Control (2015)  Target Ranges:  Prepandial:   less than 140 mg/dL      Peak postprandial:   less than 180 mg/dL (1-2 hours)      Critically ill patients:  140 - 180 mg/dL   Lab Results  Component Value Date   GLUCAP 329* 07/11/2015   HGBA1C 9.2* 05/25/2015    Review of Glycemic Control  Diabetes history: DM2 Outpatient Diabetes medications: Lantus 20 units QHS, Novolog 5 units tidwc Current orders for Inpatient glycemic control: Lantus 20 units QHS, Novolog sensitive tidwc  Inpatient Diabetes Program Recommendations:    MD- Please consider the following in-hospital insulin adjustments:   1. Increase Lantus to 22 units daily (home dose).   2. Reduce Novolog Correction Scale/ SSI to Sensitive scale (0-9 units) TID AC + HS  3. Start Custom Novolog Meal Coverage- 1 unit for every 10 grams of Carbohydrates eaten. Make sure to give this dose after patient finishes her meal due to her gastroparesis.  Will follow. Thank you. Lorenda Peck, RD, LDN, CDE Inpatient Diabetes Coordinator [phone]

## 2015-07-11 NOTE — Care Management Note (Signed)
Case Management Note  Patient Details  Name: [name] MRN: [id number] Date of Birth: 1990-06-10  Subjective/Objective:  25 y/o f admitted w/Diabetic gastroparesis. From home.                  Action/Plan:d/c plan home.   Expected Discharge Date:   (unknown)               Expected Discharge Plan:  Home/Self Care  In-House Referral:     Discharge planning Services  CM Consult  Post Acute Care Choice:    Choice offered to:     DME Arranged:    DME Agency:     HH Arranged:    HH Agency:     Status of Service:  In process, will continue to follow  If discussed at Long Length of Stay Meetings, dates discussed:    Additional Comments:  Dessa Phi, RN 07/11/2015, 12:48 PM

## 2015-07-11 NOTE — Progress Notes (Addendum)
Initial Nutrition Assessment  DOCUMENTATION CODES:   Severe malnutrition in context of acute illness/injury, Underweight  INTERVENTION:  - Continue FLD and advance diet as medically feasible. - Continue Ensure Enlive TID, each supplement provides 350 kcal and 20 grams of protein - RD will continue to follow for needs, including need for diet education prior to d/c.  NUTRITION DIAGNOSIS:   Inadequate oral intake related to acute illness, nausea, vomiting as evidenced by per patient/family report.  GOAL:   Patient will meet greater than or equal to 90% of their needs, Weight gain  MONITOR:   PO intake, Diet advancement, Supplement acceptance, Weight trends, Labs, I & O's  REASON FOR ASSESSMENT:   Malnutrition Screening Tool, Consult Assessment of nutrition requirement/status  ASSESSMENT:   25 y.o. female with medical history significant of poorly controlled type 1 diabetes, gastroparesis, hypertension, GERD, depression, anxiety, diastolic congestive heart failure, pancreatitis, asthma, who presents with intractable nausea, vomiting and abdominal pain.  Pt seen for MST and consult. BMI indicates underweight status. Pt on FLD and chart review indicates 100% completion of lunch today. Pt reports abdominal pain with N/V for several days PTA and reports last episode of emesis was yesterday. She states that nausea and abdominal pain had improved earlier today but became worse again after lunch.   Unable to have in depth conversation with pt at this time due to pt's need to use the restroom and asking RD to visit another day. Physical assessment indicates moderate muscle and fat wasting to upper and lower body. Per chart review, pt has lost 10 lbs (9% body weight) in the past 1 month which is significant for time frame.   Pt with hx of poorly controlled type 1 DM and gastroparesis. Will need to assess need for diet education prior to d/c. Please consult prior to d/c for the same, if  possible. Ensure Enlive has been ordered TID; will maintain this order rather than switching to Glucerna at this time due to protein content but will monitor for need to adjust.  Medications reviewed; 10 mg IV Reglan every 8 hours, sliding scale Novolog, 12 units Lantus/day, 12.5 mg IV Phenergan every 6 hours PRN. Labs reviewed; CBGs: 218 and 329 mg/dL today, Na: 134 mmol/L, K: 3.4 mmol/L, creatinine low, Ca: 8.5 mg/dL.  IVF: NS @ 75 mL/hr.   ADDENDUM: Notes indicate last HgbA1c was on 07/06/15: 9.25%.   Diet Order:  Diet full liquid Room service appropriate?: Yes; Fluid consistency:: Thin  Skin:  Reviewed, no issues  Last BM:  6/20  Height:   Ht Readings from Last 1 Encounters:  07/10/15 5\' 4"  (1.626 m)    Weight:   Wt Readings from Last 1 Encounters:  07/10/15 105 lb 1.6 oz (47.673 kg)    Ideal Body Weight:  54.54 kg (kg)  BMI:  Body mass index is 18.03 kg/(m^2).  Estimated Nutritional Needs:   Kcal:  1450-1650  Protein:  50-60 grams  Fluid:  >/= 1.6 L/day  EDUCATION NEEDS:   Education needs no appropriate at this time     Jarome Matin, MS, RD, LDN Inpatient Clinical Dietitian Pager # [phone] After hours/weekend pager # [phone]

## 2015-07-11 NOTE — Progress Notes (Signed)
PROGRESS NOTE  [name] [id number] DOB: January 24, 1990 DOA: 07/10/2015 PCP: Vicenta Aly, FNP  Endocrinologist Dr Hartford Poli  Brief Narrative:  25 year old woman with diabetes mellitus type 1, gastroparesis presented with intractable nausea, vomiting, abdominal pain consistent with previous flares of gastroparesis. Admitted for flare diabetic gastroparesis.  Assessment/Plan: 1. Nausea, vomiting, abdominal pain secondary to flare diabetic gastroparesis. Anion gap 7. Fasting blood sugar 208. Some improvement today, vomiting has resolved. Symptoms typical gastroparesis flare. 2. Diabetes mellitus type 1, uncontrolled with associated gastroparesis, hemoglobin A1c 9.2. Somewhat hyperglycemic although labile. 3. Asthma. Stable. 4. Mild anemia, stable compared to previous studies. 5. Chronic diastolic congestive heart failure. Not on diuretics. 6. Protein calorie malnutrition, severe 7. Urine drug screen positive for marijuana 8. Recent admission with discharge 6/17, DKA, gastroparesis flare,   Appears stable, still with nausea and poor oral intake.  Continue liquid diet, IV fluids, antiemetics, supportive care, insulin.  DVT prophylaxis: Lovenox Code Status: full  Family Communication: none Disposition Plan: home  Murray Hodgkins, MD  [facility] Direct contact: [phone] --[place] [place]  --[email or web address]; password [email or web address]  7PM-7AM contact night coverage as above 07/11/2015, 4:22 PM    Consultants:    Procedures:    Antimicrobials:    HPI/Subjective: Feels better but still nauseous, no vomiting, tolerating liquids. Some diarrhea. Some abd pain. Symptoms consistent with gastroparesis.  Objective: Filed Vitals:   07/11/15 0710 07/11/15 0741 07/11/15 0911 07/11/15 1336  BP: 124/84 122/86  150/97  Pulse:  100  111  Temp:  98.1 F (36.7 C)  98.1 F (36.7 C)  TempSrc:  Oral  Oral  Resp:  18  16  Height:      Weight:      SpO2:  99% 99% 100%     Intake/Output Summary (Last 24 hours) at 07/11/15 1622 Last data filed at 07/11/15 1156  Gross per 24 hour  Intake 338.75 ml  Output      0 ml  Net 338.75 ml     Filed Weights   07/10/15 1726  Weight: 47.673 kg (105 lb 1.6 oz)    Exam: Constitutional:  . Appears calm and mildly uncomfortable Respiratory:  . CTA bilaterally, no w/r/r.  . Respiratory effort normal. No retractions or accessory muscle use Cardiovascular:  . RRR, no m/r/g . No LE extremity edema   Abdomen:  . Abdomen appears normal; no tenderness or masses Psychiatric:  . judgement and insight appear normal . Mental status o Mood, affect appropriate  I have personally reviewed following labs and imaging studies:  Glucose 100-300s  Potassium 3.4  Hemoglobin 11.7, no leukocytosis.  Scheduled Meds: . enoxaparin (LOVENOX) injection  40 mg Subcutaneous Q24H  . escitalopram  5 mg Oral Daily  . famotidine (PEPCID) IV  20 mg Intravenous Q12H  . feeding supplement (ENSURE ENLIVE)  237 mL Oral BID BM  . insulin aspart  0-9 Units Subcutaneous TID WC  . insulin glargine  12 Units Subcutaneous Q2200  . metoCLOPramide (REGLAN) injection  10 mg Intravenous Q8H  . mometasone-formoterol  2 puff Inhalation BID  . montelukast  10 mg Oral Daily  . sodium chloride flush  3 mL Intravenous Q12H   Continuous Infusions: . sodium chloride Stopped (07/10/15 1814)    Principal Problem:   Nausea and vomiting Active Problems:   Uncontrolled type 1 diabetes mellitus (HCC)   Asthma   Diabetic gastroparesis (HCC)   Protein-calorie malnutrition, severe   Gastroparesis   GERD (gastroesophageal reflux disease)   Depression with anxiety  Time spent 20 minutes

## 2015-07-12 DIAGNOSIS — E1043 Type 1 diabetes mellitus with diabetic autonomic (poly)neuropathy: Secondary | ICD-10-CM | POA: Diagnosis present

## 2015-07-12 DIAGNOSIS — R1110 Vomiting, unspecified: Secondary | ICD-10-CM | POA: Diagnosis not present

## 2015-07-12 DIAGNOSIS — K219 Gastro-esophageal reflux disease without esophagitis: Secondary | ICD-10-CM | POA: Diagnosis present

## 2015-07-12 DIAGNOSIS — R112 Nausea with vomiting, unspecified: Secondary | ICD-10-CM | POA: Diagnosis present

## 2015-07-12 DIAGNOSIS — Z794 Long term (current) use of insulin: Secondary | ICD-10-CM | POA: Diagnosis not present

## 2015-07-12 DIAGNOSIS — K3184 Gastroparesis: Secondary | ICD-10-CM | POA: Diagnosis not present

## 2015-07-12 DIAGNOSIS — Z9049 Acquired absence of other specified parts of digestive tract: Secondary | ICD-10-CM | POA: Diagnosis not present

## 2015-07-12 DIAGNOSIS — I5032 Chronic diastolic (congestive) heart failure: Secondary | ICD-10-CM | POA: Diagnosis present

## 2015-07-12 DIAGNOSIS — E43 Unspecified severe protein-calorie malnutrition: Secondary | ICD-10-CM | POA: Diagnosis not present

## 2015-07-12 DIAGNOSIS — F418 Other specified anxiety disorders: Secondary | ICD-10-CM | POA: Diagnosis present

## 2015-07-12 DIAGNOSIS — E1065 Type 1 diabetes mellitus with hyperglycemia: Secondary | ICD-10-CM | POA: Diagnosis present

## 2015-07-12 DIAGNOSIS — E1143 Type 2 diabetes mellitus with diabetic autonomic (poly)neuropathy: Secondary | ICD-10-CM | POA: Diagnosis not present

## 2015-07-12 DIAGNOSIS — Z833 Family history of diabetes mellitus: Secondary | ICD-10-CM | POA: Diagnosis not present

## 2015-07-12 DIAGNOSIS — E876 Hypokalemia: Secondary | ICD-10-CM | POA: Diagnosis present

## 2015-07-12 DIAGNOSIS — J45909 Unspecified asthma, uncomplicated: Secondary | ICD-10-CM | POA: Diagnosis present

## 2015-07-12 DIAGNOSIS — Z8249 Family history of ischemic heart disease and other diseases of the circulatory system: Secondary | ICD-10-CM | POA: Diagnosis not present

## 2015-07-12 DIAGNOSIS — Z681 Body mass index (BMI) 19 or less, adult: Secondary | ICD-10-CM | POA: Diagnosis not present

## 2015-07-12 DIAGNOSIS — E860 Dehydration: Secondary | ICD-10-CM | POA: Diagnosis present

## 2015-07-12 LAB — GLUCOSE, CAPILLARY
GLUCOSE-CAPILLARY: 173 mg/dL — AB (ref 65–99)
Glucose-Capillary: 197 mg/dL — ABNORMAL HIGH (ref 65–99)
Glucose-Capillary: 213 mg/dL — ABNORMAL HIGH (ref 65–99)
Glucose-Capillary: 223 mg/dL — ABNORMAL HIGH (ref 65–99)
Glucose-Capillary: 276 mg/dL — ABNORMAL HIGH (ref 65–99)

## 2015-07-12 MED ORDER — INSULIN GLARGINE 100 UNIT/ML ~~LOC~~ SOLN
18.0000 [IU] | Freq: Every day | SUBCUTANEOUS | Status: DC
  Administered 2015-07-12: 18 [IU] via SUBCUTANEOUS
  Filled 2015-07-12: qty 0.18

## 2015-07-12 MED ORDER — MORPHINE SULFATE (PF) 2 MG/ML IV SOLN
2.0000 mg | INTRAVENOUS | Status: DC | PRN
  Administered 2015-07-12: 2 mg via INTRAVENOUS
  Filled 2015-07-12 (×2): qty 1

## 2015-07-12 MED ORDER — ONDANSETRON HCL 4 MG/2ML IJ SOLN
4.0000 mg | Freq: Four times a day (QID) | INTRAMUSCULAR | Status: DC
  Administered 2015-07-12 – 2015-07-13 (×3): 4 mg via INTRAVENOUS
  Filled 2015-07-12 (×4): qty 2

## 2015-07-12 NOTE — Progress Notes (Signed)
PROGRESS NOTE  [name] [id number] DOB: 1990/06/25 DOA: 07/10/2015 PCP: Vicenta Aly, FNP Endocrinologist Dr [place] Poli  Brief Narrative: 25 year old woman with diabetes mellitus type 1, gastroparesis presented with intractable nausea, vomiting, abdominal pain consistent with previous flares of gastroparesis. Admitted for flare diabetic gastroparesis.  Assessment/Plan: 1. Nausea, vomiting, abdominal pain secondary to diabetic gastroparesis. Symptoms ongoing with no clinical improvement. 2. Diabetes mellitus type 1 uncontrolled with hemoglobin A1c 9.2, associated gastroparesis. Blood sugars poorly controlled. 3. Protein calorie malnutrition, severe 4. Urine drug screen positive for marijuana 5. Recent discharge 6/17, DKA, gastroparesis flare   No improvement thus far.  Schedule Zofran, continue Reglan, increase analgesics. Patient obviously uncomfortable with poor pain control. She understands the relationship between narcotics and gastroparesis.  Increase long-acting insulin. Continue IV fluids.  DVT prophylaxis: Lovenox Code Status: full Family Communication: none Disposition Plan: home  Murray Hodgkins, MD  [facility] Direct contact: [phone] --[place] [place]  --[email or web address]; password [email or web address]  7PM-7AM contact night coverage as above 07/12/2015, 5:19 PM    Consultants:    Procedures:    Antimicrobials:    HPI/Subjective: Feels poorly. Multiple episodes of vomiting today. Nauseous. Ongoing generalized abdominal pain.  Objective: Filed Vitals:   07/11/15 1336 07/11/15 2211 07/12/15 0540 07/12/15 1405  BP: 150/97 159/114 133/104 108/80  Pulse: 111 123 95 112  Temp: 98.1 F (36.7 C) 98.9 F (37.2 C) 98.2 F (36.8 C) 99.5 F (37.5 C)  TempSrc: Oral Oral Oral Oral  Resp: 16 18 16 20   Height:      Weight:      SpO2: 100% 98% 98% 100%    Intake/Output Summary (Last 24 hours) at 07/12/15 1719 Last data filed at 07/12/15 1500  Gross per  24 hour  Intake 2188.33 ml  Output    400 ml  Net 1788.33 ml     Filed Weights   07/10/15 1726  Weight: 47.673 kg (105 lb 1.6 oz)    Exam:    Constitutional:  . Appears calm, uncomfortable, nontoxic ENMT:  . grossly normal hearing  Respiratory:  . CTA bilaterally, no w/r/r.  . Respiratory effort normal. No retractions or accessory muscle use Cardiovascular:  . RRR, no m/r/g . No LE extremity edema   Abdomen:  . Abdomen appears normal, mild tenderness, no masses Psychiatric:  . judgement and insight appear normal . Mental status o Mood, affect appropriate  I have personally reviewed following labs and imaging studies:  Blood sugars 173, 223  Scheduled Meds: . enoxaparin (LOVENOX) injection  40 mg Subcutaneous Q24H  . escitalopram  5 mg Oral Daily  . famotidine (PEPCID) IV  20 mg Intravenous Q12H  . feeding supplement (ENSURE ENLIVE)  237 mL Oral BID BM  . insulin aspart  0-9 Units Subcutaneous TID WC  . insulin glargine  18 Units Subcutaneous Q2200  . metoCLOPramide (REGLAN) injection  10 mg Intravenous Q8H  . mometasone-formoterol  2 puff Inhalation BID  . montelukast  10 mg Oral Daily  . ondansetron  4 mg Intravenous Q6H  . sodium chloride flush  3 mL Intravenous Q12H   Continuous Infusions: . sodium chloride 100 mL/hr at 07/12/15 1454    Principal Problem:   Nausea and vomiting Active Problems:   Uncontrolled type 1 diabetes mellitus (HCC)   Asthma   Diabetic gastroparesis (HCC)   Protein-calorie malnutrition, severe   Gastroparesis   GERD (gastroesophageal reflux disease)   Depression with anxiety     Time spent 20 minutes

## 2015-07-13 DIAGNOSIS — K3184 Gastroparesis: Secondary | ICD-10-CM

## 2015-07-13 DIAGNOSIS — E1143 Type 2 diabetes mellitus with diabetic autonomic (poly)neuropathy: Secondary | ICD-10-CM

## 2015-07-13 LAB — CBC
HCT: 30.2 % — ABNORMAL LOW (ref 36.0–46.0)
Hemoglobin: 10 g/dL — ABNORMAL LOW (ref 12.0–15.0)
MCH: 28.8 pg (ref 26.0–34.0)
MCHC: 33.1 g/dL (ref 30.0–36.0)
MCV: 87 fL (ref 78.0–100.0)
PLATELETS: 340 10*3/uL (ref 150–400)
RBC: 3.47 MIL/uL — AB (ref 3.87–5.11)
RDW: 13.5 % (ref 11.5–15.5)
WBC: 4.9 10*3/uL (ref 4.0–10.5)

## 2015-07-13 LAB — BASIC METABOLIC PANEL
Anion gap: 5 (ref 5–15)
BUN: <5 mg/dL — ABNORMAL LOW (ref 6–20)
CHLORIDE: 111 mmol/L (ref 101–111)
CO2: 24 mmol/L (ref 22–32)
CREATININE: 0.42 mg/dL — AB (ref 0.44–1.00)
Calcium: 8.4 mg/dL — ABNORMAL LOW (ref 8.9–10.3)
Glucose, Bld: 182 mg/dL — ABNORMAL HIGH (ref 65–99)
POTASSIUM: 3.1 mmol/L — AB (ref 3.5–5.1)
SODIUM: 140 mmol/L (ref 135–145)

## 2015-07-13 LAB — GLUCOSE, CAPILLARY: GLUCOSE-CAPILLARY: 140 mg/dL — AB (ref 65–99)

## 2015-07-13 MED ORDER — POTASSIUM CHLORIDE CRYS ER 20 MEQ PO TBCR
40.0000 meq | EXTENDED_RELEASE_TABLET | Freq: Four times a day (QID) | ORAL | Status: DC
  Administered 2015-07-13: 40 meq via ORAL
  Filled 2015-07-13: qty 2

## 2015-07-13 MED ORDER — MAGNESIUM SULFATE IN D5W 1-5 GM/100ML-% IV SOLN
1.0000 g | Freq: Once | INTRAVENOUS | Status: AC
  Administered 2015-07-13: 1 g via INTRAVENOUS
  Filled 2015-07-13: qty 100

## 2015-07-13 MED ORDER — ONDANSETRON HCL 4 MG PO TABS: 4.0000 mg | ORAL_TABLET | Freq: Three times a day (TID) | ORAL | Status: DC | PRN

## 2015-07-13 MED ORDER — POTASSIUM CHLORIDE IN NACL 40-0.9 MEQ/L-% IV SOLN
INTRAVENOUS | Status: DC
  Administered 2015-07-13: 75 mL/h via INTRAVENOUS
  Filled 2015-07-13: qty 1000

## 2015-07-13 MED ORDER — PROMETHAZINE HCL 25 MG RE SUPP: 25.0000 mg | Freq: Four times a day (QID) | RECTAL | Status: DC | PRN

## 2015-07-13 NOTE — Discharge Summary (Signed)
[name], is a 25 y.o. female  DOB [birth year]  MRN [id number].  Admission date:  07/10/2015  Admitting Physician  Ivor Costa, MD  Discharge Date:  07/13/2015   Primary MD  Vicenta Aly, FNP  Recommendations for primary care physician for things to follow:   Check BMP & Magnesium level in 3-4 days, monitor CBGs closely   Admission Diagnosis  Gastroparesis due to DM [facility]) [E11.43, K31.84]   Discharge Diagnosis  Gastroparesis due to DM ([place]) [E11.43, K31.84]    Principal Problem:   Nausea and vomiting Active Problems:   Uncontrolled type 1 diabetes mellitus ([place])   Asthma   Diabetic gastroparesis ([place])   Protein-calorie malnutrition, severe   Gastroparesis   GERD (gastroesophageal reflux disease)   Depression with anxiety      Past Medical History  Diagnosis Date  . Diabetes mellitus 2007    IDDM.  poorly controlled, multiple admits with DKA  . Asthma   . Pancreatitis, acute 11/26/2014  . Hepatic steatosis 11/26/2014    and hepatomegaly  . Liver mass 11/26/2014  . Hypertension     NOT CURRENTLY ON ANY BP MED  . Heart murmur   . Arthritis   . Gallstones   . Anxiety     Past Surgical History  Procedure Laterality Date  . Wisdom tooth extraction    . Esophagogastroduodenoscopy (egd) with propofol Left 09/20/2014    Procedure: ESOPHAGOGASTRODUODENOSCOPY (EGD) WITH PROPOFOL;  Surgeon: Arta Silence, MD;  Location: [facility] ENDOSCOPY;  Service: Endoscopy;  Laterality: Left;  . Cholecystectomy N/A 02/11/2015    Procedure: LAPAROSCOPIC CHOLECYSTECTOMY WITH INTRAOPERATIVE CHOLANGIOGRAM;  Surgeon: Greer Pickerel, MD;  Location: [facility] ORS;  Service: General;  Laterality: N/A;     HPI  from the history and physical done on the day of admission:   25 year old woman with diabetes mellitus type 1, gastroparesis  presented with intractable nausea, vomiting, abdominal pain consistent with previous flares of gastroparesis. Admitted for flare diabetic gastroparesis.   Hospital Course:     1. Nausea, vomiting, abdominal pain secondary to diabetic gastroparesis. Symptoms resolved with conservative care, Now symptom-free, tolerating diet, will be discharged home on home medications unchanged.. 2. Diabetes mellitus type 1 uncontrolled with hemoglobin A1c 9.2, associated gastroparesis. Counseled on insulin compliance and good glycemic control, requested to follow with PCP for CBG monitoring within 2-3 days. 3. Protein calorie malnutrition, severe 4. Urine drug screen positive for marijuana, counseled to quit. 5. Hypokalemia. Replaced. Request PCP to check next visit.   Follow UP  Follow-up Information    Follow up with Vicenta Aly, FNP. Schedule an appointment as soon as possible for a visit in 3 days.   Specialty:  Nurse Practitioner   Contact information:   [address] [place]  [address] [phone]        Consults obtained - none  Discharge Condition: Stable  Diet and Activity recommendation: See Discharge Instructions below  Discharge Instructions       Discharge Instructions    Call MD for:  persistant nausea  and vomiting    Complete by:  As directed      Diet - low sodium heart healthy    Complete by:  As directed      Discharge instructions    Complete by:  As directed   Follow with Primary MD ANDERSON,TERESA, FNP in 7 days   Get CBC, CMP, 2 view Chest X ray checked  by Primary MD next visit.    Activity: As tolerated with Full fall precautions use walker/cane & assistance as needed   Disposition Home    Diet:   Low Carb soft diet.  For Heart failure patients - Check your Weight same time everyday, if you gain over 2 pounds, or you develop in leg swelling, experience more shortness of breath or chest pain, call your Primary MD immediately. Follow  Cardiac Low Salt Diet and 1.5 lit/day fluid restriction.   On your next visit with your primary care physician please Get Medicines reviewed and adjusted.   Please request your Prim.MD to go over all Hospital Tests and Procedure/Radiological results at the follow up, please get all Hospital records sent to your Prim MD by signing hospital release before you go home.   If you experience worsening of your admission symptoms, develop shortness of breath, life threatening emergency, suicidal or homicidal thoughts you must seek medical attention immediately by calling 911 or calling your MD immediately  if symptoms less severe.  You Must read complete instructions/literature along with all the possible adverse reactions/side effects for all the Medicines you take and that have been prescribed to you. Take any new Medicines after you have completely understood and accpet all the possible adverse reactions/side effects.   Do not drive, operate heavy machinery, perform activities at heights, swimming or participation in water activities or provide baby sitting services if your were admitted for syncope or siezures until you have seen by Primary MD or a Neurologist and advised to do so again.  Do not drive when taking Pain medications.    Do not take more than prescribed Pain, Sleep and Anxiety Medications  Special Instructions: If you have smoked or chewed Tobacco  in the last 2 yrs please stop smoking, stop any regular Alcohol  and or any Recreational drug use.  Wear Seat belts while driving.   Please note  You were cared for by a hospitalist during your hospital stay. If you have any questions about your discharge medications or the care you received while you were in the hospital after you are discharged, you can call the unit and asked to speak with the hospitalist on call if the hospitalist that took care of you is not available. Once you are discharged, your primary care physician will  handle any further medical issues. Please note that NO REFILLS for any discharge medications will be authorized once you are discharged, as it is imperative that you return to your primary care physician (or establish a relationship with a primary care physician if you do not have one) for your aftercare needs so that they can reassess your need for medications and monitor your lab values.     Increase activity slowly    Complete by:  As directed              Discharge Medications       Medication List    TAKE these medications        albuterol 108 (90 Base) MCG/ACT inhaler  Commonly known as:  PROVENTIL HFA;VENTOLIN HFA  Inhale 2 puffs into the lungs every 6 (six) hours as needed for shortness of breath.     albuterol (2.5 MG/3ML) 0.083% nebulizer solution  Commonly known as:  PROVENTIL  Take 2.5 mg by nebulization every 6 (six) hours as needed for shortness of breath.     budesonide-formoterol 160-4.5 MCG/ACT inhaler  Commonly known as:  SYMBICORT  Inhale 2 puffs into the lungs daily.     EPIPEN 0.3 mg/0.3 mL Soaj injection  Generic drug:  EPINEPHrine  Inject 0.3 mg into the muscle once. As needed for allergic reaction. Never had to use before     insulin aspart 100 UNIT/ML FlexPen  Commonly known as:  NOVOLOG FLEXPEN  INJECT 0-20 UNITS INTO THE SKIN AS DIRECTED. SSI - CARB SCALE, 0-20 UNITS     Insulin Glargine 100 UNIT/ML Solostar Pen  Commonly known as:  LANTUS SOLOSTAR  Inject 24 Units into the skin daily at 10 pm.     LEXAPRO 10 MG tablet  Generic drug:  escitalopram  Take 5 mg by mouth daily.     metoCLOPramide 10 MG tablet  Commonly known as:  REGLAN  Take 1 tablet (10 mg total) by mouth 3 (three) times daily before meals.     montelukast 10 MG tablet  Commonly known as:  SINGULAIR  Take 10 mg by mouth daily.     omeprazole 40 MG capsule  Commonly known as:  PRILOSEC  Take 1 capsule (40 mg total) by mouth daily.     ondansetron 4 MG tablet  Commonly  known as:  ZOFRAN  Take 1 tablet (4 mg total) by mouth every 8 (eight) hours as needed for nausea or vomiting.     promethazine 25 MG tablet  Commonly known as:  PHENERGAN  Take 1 tablet (25 mg total) by mouth every 6 (six) hours as needed for nausea or vomiting.     promethazine 25 MG suppository  Commonly known as:  PHENERGAN  Place 1 suppository (25 mg total) rectally every 6 (six) hours as needed for nausea or vomiting.        Major procedures and Radiology Reports - PLEASE review detailed and final reports for all details, in brief -       Dg Abd 1 View  07/05/2015  CLINICAL DATA:  Abdominal distension, nausea and vomiting EXAM: ABDOMEN - 1 VIEW COMPARISON:  06/09/2015 FINDINGS: There is normal small bowel gas pattern. NG tube in place with tip in distal stomach/pyloric region. IMPRESSION: Normal small bowel gas pattern. NG tube in place with tip in distal stomach/ pyloric region. Electronically Signed   By: Lahoma Crocker M.D.   On: 07/05/2015 09:18    Micro Results      No results found for this or any previous visit (from the past 240 hour(s)).     Today   Subjective    [name] today has no headache,no chest abdominal pain,no new weakness tingling or numbness, feels much better wants to go home today.     Objective   Blood pressure 110/81, pulse 92, temperature 98.2 F (36.8 C), temperature source Oral, resp. rate 16, height 5\' 4"  (1.626 m), weight 47.673 kg (105 lb 1.6 oz), last menstrual period 05/31/2015, SpO2 100 %.   Intake/Output Summary (Last 24 hours) at 07/13/15 0952 Last data filed at 07/13/15 0813  Gross per 24 hour  Intake 2563.34 ml  Output    250 ml  Net 2313.34 ml    Exam Awake Alert, Oriented  x 3, No new F.N deficits, Normal affect [place].AT,PERRAL Supple Neck,No JVD, No cervical lymphadenopathy appriciated.  Symmetrical Chest wall movement, Good air movement bilaterally, CTAB RRR,No Gallops,Rubs or new Murmurs, No Parasternal  Heave +ve B.Sounds, Abd Soft, Non tender, No organomegaly appriciated, No rebound -guarding or rigidity. No Cyanosis, Clubbing or edema, No new Rash or bruise   Data Review   CBC w Diff: Lab Results  Component Value Date   WBC 4.9 07/13/2015   HGB 10.0* 07/13/2015   HCT 30.2* 07/13/2015   PLT 340 07/13/2015   LYMPHOPCT 14 07/01/2015   MONOPCT 6 07/01/2015   EOSPCT 0 07/01/2015   BASOPCT 0 07/01/2015    CMP: Lab Results  Component Value Date   NA 140 07/13/2015   K 3.1* 07/13/2015   CL 111 07/13/2015   CO2 24 07/13/2015   BUN <5* 07/13/2015   CREATININE 0.42* 07/13/2015   PROT 8.7* 07/10/2015   ALBUMIN 5.0 07/10/2015   BILITOT 1.3* 07/10/2015   ALKPHOS 113 07/10/2015   AST 23 07/10/2015   ALT 13* 07/10/2015  .   Total Time in preparing paper work, data evaluation and todays exam - 35 minutes  Thurnell Lose M.D on 07/13/2015 at 9:52 AM  [facility]   Office  [phone]

## 2015-07-13 NOTE — Discharge Instructions (Signed)
Follow with Primary MD [name],TERESA, FNP in 7 days   Get CBC, CMP, 2 view Chest X ray checked  by Primary MD next visit.    Activity: As tolerated with Full fall precautions use walker/cane & assistance as needed   Disposition Home    Diet:   Low Carb soft diet.  For Heart failure patients - Check your Weight same time everyday, if you gain over 2 pounds, or you develop in leg swelling, experience more shortness of breath or chest pain, call your Primary MD immediately. Follow Cardiac Low Salt Diet and 1.5 lit/day fluid restriction.   On your next visit with your primary care physician please Get Medicines reviewed and adjusted.   Please request your Prim.MD to go over all Hospital Tests and Procedure/Radiological results at the follow up, please get all Hospital records sent to your Prim MD by signing hospital release before you go home.   If you experience worsening of your admission symptoms, develop shortness of breath, life threatening emergency, suicidal or homicidal thoughts you must seek medical attention immediately by calling 911 or calling your MD immediately  if symptoms less severe.  You Must read complete instructions/literature along with all the possible adverse reactions/side effects for all the Medicines you take and that have been prescribed to you. Take any new Medicines after you have completely understood and accpet all the possible adverse reactions/side effects.   Do not drive, operate heavy machinery, perform activities at heights, swimming or participation in water activities or provide baby sitting services if your were admitted for syncope or siezures until you have seen by Primary MD or a Neurologist and advised to do so again.  Do not drive when taking Pain medications.    Do not take more than prescribed Pain, Sleep and Anxiety Medications  Special Instructions: If you have smoked or chewed Tobacco  in the last 2 yrs please stop smoking, stop any  regular Alcohol  and or any Recreational drug use.  Wear Seat belts while driving.   Please note  You were cared for by a hospitalist during your hospital stay. If you have any questions about your discharge medications or the care you received while you were in the hospital after you are discharged, you can call the unit and asked to speak with the hospitalist on call if the hospitalist that took care of you is not available. Once you are discharged, your primary care physician will handle any further medical issues. Please note that NO REFILLS for any discharge medications will be authorized once you are discharged, as it is imperative that you return to your primary care physician (or establish a relationship with a primary care physician if you do not have one) for your aftercare needs so that they can reassess your need for medications and monitor your lab values.

## 2015-08-01 ENCOUNTER — Inpatient Hospital Stay: Admission: RE | Admit: 2015-08-01 | Payer: BLUE CROSS/BLUE SHIELD | Source: Ambulatory Visit

## 2015-08-09 ENCOUNTER — Inpatient Hospital Stay: Admission: RE | Admit: 2015-08-09 | Payer: BLUE CROSS/BLUE SHIELD | Source: Ambulatory Visit

## 2015-11-01 ENCOUNTER — Encounter (HOSPITAL_COMMUNITY): Payer: Self-pay | Admitting: *Deleted

## 2015-11-01 ENCOUNTER — Inpatient Hospital Stay (HOSPITAL_COMMUNITY)
Admission: EM | Admit: 2015-11-01 | Discharge: 2015-11-06 | DRG: 638 | Disposition: A | Discharge status: Home / Self Care | Payer: Medicaid Other | Attending: Internal Medicine | Admitting: Internal Medicine

## 2015-11-01 ENCOUNTER — Emergency Department (HOSPITAL_COMMUNITY): Payer: Medicaid Other

## 2015-11-01 DIAGNOSIS — R109 Unspecified abdominal pain: Secondary | ICD-10-CM

## 2015-11-01 DIAGNOSIS — E860 Dehydration: Secondary | ICD-10-CM | POA: Diagnosis present

## 2015-11-01 DIAGNOSIS — E1143 Type 2 diabetes mellitus with diabetic autonomic (poly)neuropathy: Secondary | ICD-10-CM | POA: Diagnosis present

## 2015-11-01 DIAGNOSIS — E876 Hypokalemia: Secondary | ICD-10-CM | POA: Diagnosis not present

## 2015-11-01 DIAGNOSIS — Z7951 Long term (current) use of inhaled steroids: Secondary | ICD-10-CM

## 2015-11-01 DIAGNOSIS — Z8249 Family history of ischemic heart disease and other diseases of the circulatory system: Secondary | ICD-10-CM

## 2015-11-01 DIAGNOSIS — F1290 Cannabis use, unspecified, uncomplicated: Secondary | ICD-10-CM | POA: Diagnosis present

## 2015-11-01 DIAGNOSIS — E1010 Type 1 diabetes mellitus with ketoacidosis without coma: Principal | ICD-10-CM | POA: Diagnosis present

## 2015-11-01 DIAGNOSIS — Z833 Family history of diabetes mellitus: Secondary | ICD-10-CM

## 2015-11-01 DIAGNOSIS — E10649 Type 1 diabetes mellitus with hypoglycemia without coma: Secondary | ICD-10-CM | POA: Diagnosis not present

## 2015-11-01 DIAGNOSIS — N3289 Other specified disorders of bladder: Secondary | ICD-10-CM | POA: Diagnosis not present

## 2015-11-01 DIAGNOSIS — I5032 Chronic diastolic (congestive) heart failure: Secondary | ICD-10-CM | POA: Diagnosis present

## 2015-11-01 DIAGNOSIS — R339 Retention of urine, unspecified: Secondary | ICD-10-CM | POA: Diagnosis present

## 2015-11-01 DIAGNOSIS — E0810 Diabetes mellitus due to underlying condition with ketoacidosis without coma: Secondary | ICD-10-CM

## 2015-11-01 DIAGNOSIS — K219 Gastro-esophageal reflux disease without esophagitis: Secondary | ICD-10-CM | POA: Diagnosis present

## 2015-11-01 DIAGNOSIS — IMO0002 Reserved for concepts with insufficient information to code with codable children: Secondary | ICD-10-CM | POA: Diagnosis present

## 2015-11-01 DIAGNOSIS — N1330 Unspecified hydronephrosis: Secondary | ICD-10-CM | POA: Diagnosis present

## 2015-11-01 DIAGNOSIS — J45909 Unspecified asthma, uncomplicated: Secondary | ICD-10-CM | POA: Diagnosis present

## 2015-11-01 DIAGNOSIS — R1110 Vomiting, unspecified: Secondary | ICD-10-CM

## 2015-11-01 DIAGNOSIS — E1065 Type 1 diabetes mellitus with hyperglycemia: Secondary | ICD-10-CM | POA: Diagnosis present

## 2015-11-01 DIAGNOSIS — E1043 Type 1 diabetes mellitus with diabetic autonomic (poly)neuropathy: Secondary | ICD-10-CM | POA: Diagnosis present

## 2015-11-01 DIAGNOSIS — K3184 Gastroparesis: Secondary | ICD-10-CM

## 2015-11-01 DIAGNOSIS — R338 Other retention of urine: Secondary | ICD-10-CM

## 2015-11-01 DIAGNOSIS — F419 Anxiety disorder, unspecified: Secondary | ICD-10-CM | POA: Diagnosis present

## 2015-11-01 DIAGNOSIS — E1110 Type 2 diabetes mellitus with ketoacidosis without coma: Secondary | ICD-10-CM | POA: Diagnosis present

## 2015-11-01 DIAGNOSIS — R112 Nausea with vomiting, unspecified: Secondary | ICD-10-CM

## 2015-11-01 DIAGNOSIS — Z794 Long term (current) use of insulin: Secondary | ICD-10-CM

## 2015-11-01 DIAGNOSIS — I110 Hypertensive heart disease with heart failure: Secondary | ICD-10-CM | POA: Diagnosis present

## 2015-11-01 LAB — COMPREHENSIVE METABOLIC PANEL
ALBUMIN: 5.2 g/dL — AB (ref 3.5–5.0)
ALT: 19 U/L (ref 14–54)
AST: 42 U/L — AB (ref 15–41)
Alkaline Phosphatase: 119 U/L (ref 38–126)
Anion gap: 18 — ABNORMAL HIGH (ref 5–15)
BUN: 14 mg/dL (ref 6–20)
CHLORIDE: 101 mmol/L (ref 101–111)
CO2: 20 mmol/L — AB (ref 22–32)
Calcium: 10.3 mg/dL (ref 8.9–10.3)
Creatinine, Ser: 0.74 mg/dL (ref 0.44–1.00)
GFR calc Af Amer: >60 mL/min (ref >60)
GFR calc non Af Amer: >60 mL/min (ref >60)
GLUCOSE: 99 mg/dL (ref 65–99)
POTASSIUM: 3.4 mmol/L — AB (ref 3.5–5.1)
Sodium: 139 mmol/L (ref 135–145)
Total Bilirubin: 0.7 mg/dL (ref 0.3–1.2)
Total Protein: 9.8 g/dL — ABNORMAL HIGH (ref 6.5–8.1)

## 2015-11-01 LAB — URINE MICROSCOPIC-ADD ON: RBC / HPF: NONE SEEN RBC/hpf (ref 0–5)

## 2015-11-01 LAB — URINALYSIS, ROUTINE W REFLEX MICROSCOPIC
BILIRUBIN URINE: NEGATIVE
Hgb urine dipstick: NEGATIVE
Nitrite: NEGATIVE
PH: 6 (ref 5.0–8.0)
Protein, ur: 30 mg/dL — AB
Specific Gravity, Urine: 1.034 — ABNORMAL HIGH (ref 1.005–1.030)

## 2015-11-01 LAB — I-STAT BETA HCG BLOOD, ED (MC, WL, AP ONLY): I-stat hCG, quantitative: <5 m[IU]/mL (ref <5)

## 2015-11-01 LAB — CBC
HEMATOCRIT: 45.1 % (ref 36.0–46.0)
Hemoglobin: 15.1 g/dL — ABNORMAL HIGH (ref 12.0–15.0)
MCH: 30.3 pg (ref 26.0–34.0)
MCHC: 33.5 g/dL (ref 30.0–36.0)
MCV: 90.6 fL (ref 78.0–100.0)
Platelets: 451 10*3/uL — ABNORMAL HIGH (ref 150–400)
RBC: 4.98 MIL/uL (ref 3.87–5.11)
RDW: 13.3 % (ref 11.5–15.5)
WBC: 12.2 10*3/uL — ABNORMAL HIGH (ref 4.0–10.5)

## 2015-11-01 LAB — LIPASE, BLOOD: LIPASE: 12 U/L (ref 11–51)

## 2015-11-01 LAB — RAPID URINE DRUG SCREEN, HOSP PERFORMED
Amphetamines: NOT DETECTED
BARBITURATES: NOT DETECTED
BENZODIAZEPINES: NOT DETECTED
Cocaine: NOT DETECTED
Opiates: NOT DETECTED
TETRAHYDROCANNABINOL: POSITIVE — AB

## 2015-11-01 MED ORDER — HALOPERIDOL LACTATE 5 MG/ML IJ SOLN
2.0000 mg | Freq: Once | INTRAMUSCULAR | Status: AC
  Administered 2015-11-01: 2 mg via INTRAVENOUS
  Filled 2015-11-01: qty 1

## 2015-11-01 MED ORDER — KETOROLAC TROMETHAMINE 30 MG/ML IJ SOLN
30.0000 mg | Freq: Once | INTRAMUSCULAR | Status: AC
  Administered 2015-11-01: 30 mg via INTRAVENOUS
  Filled 2015-11-01: qty 1

## 2015-11-01 MED ORDER — METOCLOPRAMIDE HCL 5 MG/ML IJ SOLN
10.0000 mg | Freq: Once | INTRAMUSCULAR | Status: AC
  Administered 2015-11-01: 10 mg via INTRAVENOUS
  Filled 2015-11-01: qty 2

## 2015-11-01 MED ORDER — ONDANSETRON HCL 4 MG/2ML IJ SOLN
4.0000 mg | Freq: Once | INTRAMUSCULAR | Status: DC | PRN
  Filled 2015-11-01: qty 2

## 2015-11-01 MED ORDER — FAMOTIDINE IN NACL 20-0.9 MG/50ML-% IV SOLN
20.0000 mg | Freq: Once | INTRAVENOUS | Status: AC
  Administered 2015-11-01: 20 mg via INTRAVENOUS
  Filled 2015-11-01: qty 50

## 2015-11-01 MED ORDER — DICYCLOMINE HCL 10 MG/ML IM SOLN
20.0000 mg | Freq: Once | INTRAMUSCULAR | Status: AC
  Administered 2015-11-01: 20 mg via INTRAMUSCULAR
  Filled 2015-11-01: qty 2

## 2015-11-01 MED ORDER — SODIUM CHLORIDE 0.9 % IV BOLUS (SEPSIS)
1000.0000 mL | Freq: Once | INTRAVENOUS | Status: AC
  Administered 2015-11-01: 1000 mL via INTRAVENOUS

## 2015-11-01 MED ORDER — PROMETHAZINE HCL 25 MG/ML IJ SOLN
12.5000 mg | Freq: Once | INTRAMUSCULAR | Status: AC
  Administered 2015-11-01: 12.5 mg via INTRAVENOUS
  Filled 2015-11-01: qty 1

## 2015-11-01 NOTE — ED Notes (Signed)
Pt actively vomiting.  PA aware.

## 2015-11-01 NOTE — ED Notes (Signed)
Patient sleeping with no vomiting at this time.  When drawing blood patient states she is still in pain.

## 2015-11-01 NOTE — ED Provider Notes (Signed)
[facility] DEPT Provider Note   CSN: [id number] Arrival date & time: 11/01/15  1619  By signing my name below, I, Gwenlyn Fudge, attest that this documentation has been prepared under the direction and in the presence of [employer], PA-C. Electronically Signed: Gwenlyn Fudge, ED Scribe. 11/01/15. 8:33 PM.  History   Chief Complaint Chief Complaint  Patient presents with  . Emesis  . Abdominal Pain   The history is provided by the patient and a parent. No language interpreter was used.   HPI Comments: [name] is a 25 y.o. female with PMHx of Gallstones, Pancreatitis, HTN, Gastroparesis and DM who presents to the Emergency Department complaining of gradual onset, episodic vomiting onset this afternoon at 3 PM. Pt reports associated abdominal pain. She states she has had flatulence and had a bowel movement today. Denies fever, diarrhea. Pt currently taking insulin for DM.   Past Medical History:  Diagnosis Date  . Anxiety   . Arthritis   . Asthma   . Diabetes mellitus 2007   IDDM.  poorly controlled, multiple admits with DKA  . Gallstones   . Heart murmur   . Hepatic steatosis 11/26/2014   and hepatomegaly  . Hypertension    NOT CURRENTLY ON ANY BP MED  . Liver mass 11/26/2014  . Pancreatitis, acute 11/26/2014    Patient Active Problem List   Diagnosis Date Noted  . Gastroparesis due to DM ([place]) 07/10/2015  . GERD (gastroesophageal reflux disease) 07/10/2015  . Depression with anxiety 07/10/2015  . Uncontrolled type 1 diabetes mellitus with hyperglycemia ([place])   . Gastroparesis 06/22/2015  . Altered mental status 06/22/2015  . Volume depletion 06/10/2015  . Protein-calorie malnutrition, severe 06/10/2015  . Hyperglycemia   . Uncontrollable vomiting   . Elevated bilirubin   . Type 1 diabetes mellitus with hyperglycemia ([place]) 05/29/2015  . Hematemesis with nausea   . Intractable vomiting 05/28/2015  . Intractable nausea and vomiting 05/28/2015  . Abdominal  pain in female   . Abdominal pain 05/24/2015  . Hypertension 05/24/2015  . Dehydration   . Diabetic gastroparesis ([place])   . Chronic diastolic heart failure ([place]) 04/11/2015  . Hematemesis 04/08/2015  . DKA (diabetic ketoacidoses) ([place]) 03/22/2015  . Nausea vomiting and diarrhea 03/22/2015  . Nausea & vomiting   . Diabetic ketoacidosis without coma associated with type 1 diabetes mellitus ([place])   . S/P laparoscopic cholecystectomy 02/11/2015  . Status post laparoscopic cholecystectomy 02/11/2015  . Postextubation stridor   . Nausea and vomiting 11/27/2014  . Pancreatitis, acute 11/26/2014  . Volume overload 11/26/2014  . Hypokalemia 11/26/2014  . Hepatic steatosis 11/26/2014  . Liver mass 11/26/2014  . Sepsis ([place]) 11/25/2014  . Sinus tachycardia 11/25/2014  . Hypomagnesemia 11/25/2014  . Hypophosphatemia 11/25/2014  . Elevated amylase and lipase 11/25/2014  . DKA, type 1 ([place]) 11/24/2014  . Elevated LFTs 11/24/2014  . Acute kidney injury ([place]) 11/24/2014  . Migraine headache 11/24/2014  . Asthma 06/29/2012  . Uncontrolled type 1 diabetes mellitus ([place]) 06/19/2010  . Goiter, unspecified 06/19/2010    Past Surgical History:  Procedure Laterality Date  . CHOLECYSTECTOMY N/A 02/11/2015   Procedure: LAPAROSCOPIC CHOLECYSTECTOMY WITH INTRAOPERATIVE CHOLANGIOGRAM;  Surgeon: Greer Pickerel, MD;  Location: [facility] ORS;  Service: General;  Laterality: N/A;  . ESOPHAGOGASTRODUODENOSCOPY (EGD) WITH PROPOFOL Left 09/20/2014   Procedure: ESOPHAGOGASTRODUODENOSCOPY (EGD) WITH PROPOFOL;  Surgeon: Arta Silence, MD;  Location: [facility] ENDOSCOPY;  Service: Endoscopy;  Laterality: Left;  . WISDOM TOOTH EXTRACTION      OB  History    Gravida Para Term Preterm AB Living   2 1 0 1 1 1    SAB TAB Ectopic Multiple Live Births   0 1 0 0 1       Home Medications    Prior to Admission medications   Medication Sig Start Date End Date Taking? Authorizing Provider  albuterol (PROVENTIL HFA;VENTOLIN HFA) 108  (90 BASE) MCG/ACT inhaler Inhale 2 puffs into the lungs every 6 (six) hours as needed for shortness of breath.    Historical Provider, MD  albuterol (PROVENTIL) (2.5 MG/3ML) 0.083% nebulizer solution Take 2.5 mg by nebulization every 6 (six) hours as needed for shortness of breath.    Historical Provider, MD  budesonide-formoterol (SYMBICORT) 160-4.5 MCG/ACT inhaler Inhale 2 puffs into the lungs daily.     Historical Provider, MD  EPINEPHrine (EPIPEN) 0.3 mg/0.3 mL SOAJ injection Inject 0.3 mg into the muscle once. As needed for allergic reaction. Never had to use before    Historical Provider, MD  escitalopram (LEXAPRO) 10 MG tablet Take 5 mg by mouth daily. 06/20/15 06/19/16  Historical Provider, MD  insulin aspart (NOVOLOG FLEXPEN) 100 UNIT/ML FlexPen INJECT 0-20 UNITS INTO THE SKIN AS DIRECTED. SSI - CARB SCALE, 0-20 UNITS Patient taking differently: Inject 0-20 Units into the skin 4 (four) times daily as needed for high blood sugar. Sliding scale 10/19/13   Elayne Snare, MD  Insulin Glargine (LANTUS SOLOSTAR) 100 UNIT/ML Solostar Pen Inject 24 Units into the skin daily at 10 pm. 05/25/15   Domenic Polite, MD  metoCLOPramide (REGLAN) 10 MG tablet Take 1 tablet (10 mg total) by mouth 3 (three) times daily before meals. 06/11/15   Shanker Kristeen Mans, MD  montelukast (SINGULAIR) 10 MG tablet Take 10 mg by mouth daily.    Historical Provider, MD  omeprazole (PRILOSEC) 40 MG capsule Take 1 capsule (40 mg total) by mouth daily. Patient not taking: Reported on 07/01/2015 04/23/15   Orson Eva, MD  ondansetron (ZOFRAN) 4 MG tablet Take 1 tablet (4 mg total) by mouth every 8 (eight) hours as needed for nausea or vomiting. 07/13/15   Thurnell Lose, MD  promethazine (PHENERGAN) 25 MG suppository Place 1 suppository (25 mg total) rectally every 6 (six) hours as needed for nausea or vomiting. 07/13/15   Thurnell Lose, MD  promethazine (PHENERGAN) 25 MG tablet Take 1 tablet (25 mg total) by mouth every 6 (six) hours as  needed for nausea or vomiting. 07/09/15   Fredia Sorrow, MD    Family History Family History  Problem Relation Age of Onset  . Heart disease Maternal Grandmother   . Heart disease Maternal Grandfather   . Diabetes Mother   . Hyperlipidemia Mother   . Hypertension Father   . Heart disease Father   . Hypertension Paternal Grandmother   . Cancer Paternal Grandfather     Social History Social History  Substance Use Topics  . Smoking status: Never Smoker  . Smokeless tobacco: Never Used  . Alcohol use No     Allergies   Peanut-containing drug products; Strawberry extract; and Ultram [tramadol]   Review of Systems Review of Systems  Constitutional: Negative for fever.  Gastrointestinal: Positive for abdominal pain and vomiting. Negative for diarrhea.  Ten systems reviewed and are negative for acute change, except as noted in the HPI.     Physical Exam Updated Vital Signs BP 143/94 (BP Location: Left Arm)   Pulse 116   Temp 98.3 F (36.8 C) (Oral)  Resp 18   Wt 120 lb (54.4 kg)   LMP 10/19/2015   SpO2 100%   BMI 20.60 kg/m   Physical Exam  Constitutional: She is oriented to person, place, and time. She appears well-developed and well-nourished. She is active. No distress.  Patient actively dry heaving.  HENT:  Head: Normocephalic and atraumatic.  Eyes: Conjunctivae and EOM are normal. No scleral icterus.  Neck: Normal range of motion.  Cardiovascular: Regular rhythm and intact distal pulses.   Tachycardic  Pulmonary/Chest: Effort normal. No respiratory distress. She has no wheezes.  Lungs CTAB. Chest expansion symmetric.  Abdominal: Soft. She exhibits no distension. There is tenderness.  Soft, nondistended, diffusely tender abdomen. No focal TTP or peritoneal signs.  Musculoskeletal: Normal range of motion.  Neurological: She is alert and oriented to person, place, and time. She exhibits normal muscle tone. Coordination normal.  Patient moving all  extremities.  Skin: Skin is warm and dry. No rash noted. She is not diaphoretic. No erythema. No pallor.  Psychiatric: She has a normal mood and affect. Her behavior is normal.  Nursing note and vitals reviewed.    ED Treatments / Results  DIAGNOSTIC STUDIES: Oxygen Saturation is 97% on RA, adequate by my interpretation.    COORDINATION OF CARE:  8:33 PM  Discussed treatment plan with pt at bedside which includes DG Abdomen, Pepcid, Toradol, and Haldol and pt agreed to plan.  10:04 PM Patient reassessed. She has stopped vomiting, but continues to complain of nausea. She reports that pain is mildly improved. UDS positive for marijuana.  12:30 AM Patient still with emesis and c/o abdominal pain despite antiemetics. Case discussed with Dr. Shanon Brow who will admit. Dr. Shanon Brow requested add-on troponin. This has been ordered.   Labs (all labs ordered are listed, but only abnormal results are displayed) Labs Reviewed  COMPREHENSIVE METABOLIC PANEL - Abnormal; Notable for the following:       Result Value   Potassium 3.4 (*)    CO2 20 (*)    Total Protein 9.8 (*)    Albumin 5.2 (*)    AST 42 (*)    Anion gap 18 (*)    All other components within normal limits  CBC - Abnormal; Notable for the following:    WBC 12.2 (*)    Hemoglobin 15.1 (*)    Platelets 451 (*)    All other components within normal limits  URINALYSIS, ROUTINE W REFLEX MICROSCOPIC (NOT AT [facility]) - Abnormal; Notable for the following:    APPearance CLOUDY (*)    Specific Gravity, Urine 1.034 (*)    Glucose, UA >1000 (*)    Ketones, ur >80 (*)    Protein, ur 30 (*)    Leukocytes, UA SMALL (*)    All other components within normal limits  RAPID URINE DRUG SCREEN, HOSP PERFORMED - Abnormal; Notable for the following:    Tetrahydrocannabinol POSITIVE (*)    All other components within normal limits  URINE MICROSCOPIC-ADD ON - Abnormal; Notable for the following:    Squamous Epithelial / LPF 6-30 (*)    Bacteria, UA  RARE (*)    All other components within normal limits  LIPASE, BLOOD  I-STAT BETA HCG BLOOD, ED ([facility], [facility], [facility] ONLY)    EKG  EKG Interpretation None       Radiology Dg Abd 2 Views  Result Date: 11/01/2015 CLINICAL DATA:  Vomiting. Nausea and diffuse abdominal pain for 1 day. EXAM: ABDOMEN - 2 VIEW COMPARISON:  Radiographs 07/05/2015.  CT 04/21/2015 FINDINGS: No dilated bowel loops to suggest obstruction. Small volume of stool in the descending colon. There is otherwise generalized paucity of bowel gas. This is unchanged from scout tomogram from CT which demonstrated normal bowel. No free air. No radiopaque calculi. Calcifications in the pelvis are phleboliths. The lung bases are clear. There is no osseous abnormality. IMPRESSION: Small stool burden.  No bowel dilatation to suggest obstruction. Electronically Signed   By: Jeb Levering M.D.   On: 11/01/2015 22:18    Procedures Procedures (including critical care time)  Medications Ordered in ED Medications  ondansetron (ZOFRAN) injection 4 mg (not administered)  sodium chloride 0.9 % bolus 2,000 mL (not administered)  metoCLOPramide (REGLAN) injection 10 mg (10 mg Intravenous Given 11/01/15 1653)  sodium chloride 0.9 % bolus 1,000 mL (0 mLs Intravenous Stopped 11/01/15 2134)  haloperidol lactate (HALDOL) injection 2 mg (2 mg Intravenous Given 11/01/15 2035)  famotidine (PEPCID) IVPB 20 mg premix (0 mg Intravenous Stopped 11/01/15 2108)  ketorolac (TORADOL) 30 MG/ML injection 30 mg (30 mg Intravenous Given 11/01/15 2037)  promethazine (PHENERGAN) injection 12.5 mg (12.5 mg Intravenous Given 11/01/15 2216)  dicyclomine (BENTYL) injection 20 mg (20 mg Intramuscular Given 11/01/15 2216)     Initial Impression / Assessment and Plan / ED Course  I have reviewed the triage vital signs and the nursing notes.  Pertinent labs & imaging results that were available during my care of the patient were reviewed by me and considered in my  medical decision making (see chart for details).  Clinical Course    25 year old female comes to the emergency department for evaluation of nausea and vomiting. She has a history of gastroparesis. Patient is a type I diabetic. No evidence of DKA today. Patient is positive for marijuana. Question tinnitus hyperemesis. Symptoms uncontrolled after multiple antiemetics and fluids. Will admit to [facility] for further management.   Final Clinical Impressions(s) / ED Diagnoses   Final diagnoses:  Vomiting  Intractable vomiting with nausea, unspecified vomiting type  Gastroparesis  Episodic cannabis use    New Prescriptions New Prescriptions   No medications on file    I personally performed the services described in this documentation, which was scribed in my presence. The recorded information has been reviewed and is accurate.      Antonietta Breach, PA-C 11/02/15 [id number]    Charlesetta Shanks, MD 11/02/15 [phone]

## 2015-11-01 NOTE — ED Triage Notes (Addendum)
Pt complains of emesis and abdominal pain since this afternoon. Pt has hx of DM and gastroparesis. Pt denies diarrhea.   CBG 90.

## 2015-11-02 ENCOUNTER — Observation Stay (HOSPITAL_COMMUNITY): Payer: Medicaid Other

## 2015-11-02 ENCOUNTER — Encounter (HOSPITAL_COMMUNITY): Payer: Self-pay | Admitting: Radiology

## 2015-11-02 DIAGNOSIS — E860 Dehydration: Secondary | ICD-10-CM

## 2015-11-02 DIAGNOSIS — R000 Tachycardia, unspecified: Secondary | ICD-10-CM

## 2015-11-02 DIAGNOSIS — K3184 Gastroparesis: Secondary | ICD-10-CM

## 2015-11-02 DIAGNOSIS — R112 Nausea with vomiting, unspecified: Secondary | ICD-10-CM

## 2015-11-02 DIAGNOSIS — I5032 Chronic diastolic (congestive) heart failure: Secondary | ICD-10-CM

## 2015-11-02 DIAGNOSIS — E1143 Type 2 diabetes mellitus with diabetic autonomic (poly)neuropathy: Secondary | ICD-10-CM

## 2015-11-02 LAB — BASIC METABOLIC PANEL
Anion gap: 16 — ABNORMAL HIGH (ref 5–15)
BUN: 10 mg/dL (ref 6–20)
CHLORIDE: 111 mmol/L (ref 101–111)
CO2: 13 mmol/L — AB (ref 22–32)
Calcium: 8.8 mg/dL — ABNORMAL LOW (ref 8.9–10.3)
Creatinine, Ser: 0.64 mg/dL (ref 0.44–1.00)
GFR calc Af Amer: >60 mL/min (ref >60)
GFR calc non Af Amer: >60 mL/min (ref >60)
GLUCOSE: 191 mg/dL — AB (ref 65–99)
POTASSIUM: 4.2 mmol/L (ref 3.5–5.1)
Sodium: 140 mmol/L (ref 135–145)

## 2015-11-02 LAB — GLUCOSE, CAPILLARY
Glucose-Capillary: 175 mg/dL — ABNORMAL HIGH (ref 65–99)
Glucose-Capillary: 290 mg/dL — ABNORMAL HIGH (ref 65–99)
Glucose-Capillary: 226 mg/dL — ABNORMAL HIGH (ref 65–99)
Glucose-Capillary: 279 mg/dL — ABNORMAL HIGH (ref 65–99)
Glucose-Capillary: 231 mg/dL — ABNORMAL HIGH (ref 65–99)

## 2015-11-02 LAB — LACTIC ACID, PLASMA
LACTIC ACID, VENOUS: 2.9 mmol/L — AB (ref 0.5–1.9)
LACTIC ACID, VENOUS: 1.3 mmol/L (ref 0.5–1.9)

## 2015-11-02 LAB — CBC
HEMATOCRIT: 37.5 % (ref 36.0–46.0)
HEMOGLOBIN: 12.7 g/dL (ref 12.0–15.0)
MCH: 30.1 pg (ref 26.0–34.0)
MCHC: 33.9 g/dL (ref 30.0–36.0)
MCV: 88.9 fL (ref 78.0–100.0)
Platelets: 382 10*3/uL (ref 150–400)
RBC: 4.22 MIL/uL (ref 3.87–5.11)
RDW: 13.5 % (ref 11.5–15.5)
WBC: 13.5 10*3/uL — ABNORMAL HIGH (ref 4.0–10.5)

## 2015-11-02 LAB — I-STAT TROPONIN, ED: TROPONIN I, POC: 0 ng/mL (ref 0.00–0.08)

## 2015-11-02 LAB — PROCALCITONIN: Procalcitonin: 0.14 ng/mL

## 2015-11-02 LAB — CBG MONITORING, ED: Glucose-Capillary: 397 mg/dL — ABNORMAL HIGH (ref 65–99)

## 2015-11-02 LAB — MAGNESIUM: MAGNESIUM: 2.1 mg/dL (ref 1.7–2.4)

## 2015-11-02 MED ORDER — SODIUM CHLORIDE 0.9 % IV BOLUS (SEPSIS)
2000.0000 mL | Freq: Once | INTRAVENOUS | Status: DC
  Administered 2015-11-02: 2000 mL via INTRAVENOUS

## 2015-11-02 MED ORDER — POTASSIUM CHLORIDE IN NACL 20-0.9 MEQ/L-% IV SOLN
INTRAVENOUS | Status: DC
  Administered 2015-11-02: 1000 mL via INTRAVENOUS
  Administered 2015-11-02: 13:00:00 via INTRAVENOUS
  Filled 2015-11-02 (×3): qty 1000

## 2015-11-02 MED ORDER — METOCLOPRAMIDE HCL 5 MG/ML IJ SOLN
10.0000 mg | Freq: Three times a day (TID) | INTRAMUSCULAR | Status: DC
  Administered 2015-11-02 – 2015-11-06 (×14): 10 mg via INTRAVENOUS
  Filled 2015-11-02 (×14): qty 2

## 2015-11-02 MED ORDER — INSULIN ASPART 100 UNIT/ML FLEXPEN: 0.0000 [IU] | PEN_INJECTOR | Freq: Four times a day (QID) | SUBCUTANEOUS | Status: DC | PRN

## 2015-11-02 MED ORDER — INSULIN ASPART 100 UNIT/ML ~~LOC~~ SOLN
0.0000 [IU] | SUBCUTANEOUS | Status: DC
  Administered 2015-11-02 (×3): 3 [IU] via SUBCUTANEOUS
  Administered 2015-11-02: 9 [IU] via SUBCUTANEOUS
  Administered 2015-11-02: 5 [IU] via SUBCUTANEOUS
  Administered 2015-11-02: 2 [IU] via SUBCUTANEOUS
  Administered 2015-11-03: 9 [IU] via SUBCUTANEOUS

## 2015-11-02 MED ORDER — SODIUM CHLORIDE 0.9 % IV BOLUS (SEPSIS)
500.0000 mL | Freq: Once | INTRAVENOUS | Status: AC
  Administered 2015-11-02: 500 mL via INTRAVENOUS

## 2015-11-02 MED ORDER — MORPHINE SULFATE (PF) 2 MG/ML IV SOLN
2.0000 mg | INTRAVENOUS | Status: DC | PRN
  Administered 2015-11-02 – 2015-11-03 (×5): 2 mg via INTRAVENOUS
  Filled 2015-11-02 (×6): qty 1

## 2015-11-02 MED ORDER — IOPAMIDOL (ISOVUE-300) INJECTION 61%
30.0000 mL | Freq: Once | INTRAVENOUS | Status: AC | PRN
  Administered 2015-11-02: 30 mL via ORAL

## 2015-11-02 MED ORDER — ONDANSETRON HCL 4 MG PO TABS: 4.0000 mg | ORAL_TABLET | Freq: Four times a day (QID) | ORAL | Status: DC | PRN

## 2015-11-02 MED ORDER — METOPROLOL TARTRATE 5 MG/5ML IV SOLN
5.0000 mg | INTRAVENOUS | Status: DC | PRN
  Administered 2015-11-02 – 2015-11-04 (×5): 5 mg via INTRAVENOUS
  Filled 2015-11-02 (×5): qty 5

## 2015-11-02 MED ORDER — SODIUM CHLORIDE 4 MEQ/ML IV SOLN
INTRAVENOUS | Status: DC
  Administered 2015-11-02 – 2015-11-04 (×4): via INTRAVENOUS
  Filled 2015-11-02 (×4): qty 9.71

## 2015-11-02 MED ORDER — ENOXAPARIN SODIUM 30 MG/0.3ML ~~LOC~~ SOLN: 30.0000 mg | SUBCUTANEOUS | Status: DC

## 2015-11-02 MED ORDER — MONTELUKAST SODIUM 10 MG PO TABS
10.0000 mg | ORAL_TABLET | Freq: Every day | ORAL | Status: DC
  Administered 2015-11-05 – 2015-11-06 (×2): 10 mg via ORAL
  Filled 2015-11-02 (×4): qty 1

## 2015-11-02 MED ORDER — KETOROLAC TROMETHAMINE 15 MG/ML IJ SOLN
15.0000 mg | Freq: Once | INTRAMUSCULAR | Status: AC
  Administered 2015-11-02: 15 mg via INTRAVENOUS
  Filled 2015-11-02: qty 1

## 2015-11-02 MED ORDER — FAMOTIDINE IN NACL 20-0.9 MG/50ML-% IV SOLN
20.0000 mg | Freq: Two times a day (BID) | INTRAVENOUS | Status: DC
  Administered 2015-11-02 – 2015-11-05 (×8): 20 mg via INTRAVENOUS
  Filled 2015-11-02 (×10): qty 50

## 2015-11-02 MED ORDER — ESCITALOPRAM OXALATE 10 MG PO TABS
5.0000 mg | ORAL_TABLET | Freq: Every day | ORAL | Status: DC
  Administered 2015-11-05 – 2015-11-06 (×2): 5 mg via ORAL
  Filled 2015-11-02 (×4): qty 1

## 2015-11-02 MED ORDER — SODIUM CHLORIDE 0.9 % IV BOLUS (SEPSIS): 1000.0000 mL | Freq: Once | INTRAVENOUS | Status: DC

## 2015-11-02 MED ORDER — ALBUTEROL SULFATE (2.5 MG/3ML) 0.083% IN NEBU: 2.5000 mg | INHALATION_SOLUTION | Freq: Four times a day (QID) | RESPIRATORY_TRACT | Status: DC | PRN

## 2015-11-02 MED ORDER — ONDANSETRON HCL 4 MG/2ML IJ SOLN
4.0000 mg | Freq: Once | INTRAMUSCULAR | Status: AC
  Administered 2015-11-02: 4 mg via INTRAVENOUS
  Filled 2015-11-02: qty 2

## 2015-11-02 MED ORDER — ENOXAPARIN SODIUM 40 MG/0.4ML ~~LOC~~ SOLN
40.0000 mg | SUBCUTANEOUS | Status: DC
  Administered 2015-11-02: 40 mg via SUBCUTANEOUS
  Filled 2015-11-02: qty 0.4

## 2015-11-02 MED ORDER — LORAZEPAM 2 MG/ML IJ SOLN
1.0000 mg | Freq: Once | INTRAMUSCULAR | Status: AC
  Administered 2015-11-02: 1 mg via INTRAVENOUS
  Filled 2015-11-02: qty 1

## 2015-11-02 MED ORDER — INSULIN GLARGINE 100 UNIT/ML ~~LOC~~ SOLN
24.0000 [IU] | Freq: Every day | SUBCUTANEOUS | Status: DC
  Administered 2015-11-02 – 2015-11-04 (×3): 24 [IU] via SUBCUTANEOUS
  Filled 2015-11-02 (×5): qty 0.24

## 2015-11-02 MED ORDER — ALBUTEROL SULFATE HFA 108 (90 BASE) MCG/ACT IN AERS: 2.0000 | INHALATION_SPRAY | Freq: Four times a day (QID) | RESPIRATORY_TRACT | Status: DC | PRN

## 2015-11-02 MED ORDER — ONDANSETRON HCL 4 MG/2ML IJ SOLN
4.0000 mg | Freq: Four times a day (QID) | INTRAMUSCULAR | Status: DC | PRN
  Administered 2015-11-02 – 2015-11-04 (×8): 4 mg via INTRAVENOUS
  Filled 2015-11-02 (×8): qty 2

## 2015-11-02 NOTE — Progress Notes (Addendum)
Report called to [facility].  4th floor telemetry.

## 2015-11-02 NOTE — Progress Notes (Signed)
PROGRESS NOTE    [name]  [id number] DOB: [birth year] DOA: 11/01/2015 PCP: Vicenta Aly, FNP    Brief Narrative:  25 y.o. female with medical history significant of DM1 since age 55 yo, gastroparesis, htn, pancreatitis, asthma, anxiety comes in with acute onset of intractable vomiting for one day.  Patient admitted after midnight.  Assessment & Plan:   #  Diabetic gastroparesis [facility]): Patient with nausea vomiting and abdominal pain on admission likely due to diabetic gastroparesis. She has soft, nontender and not distended abdomen on physical exam. She wants to try clear liquid. Denied vomiting today but continued to have abdominal pain. Lipase level not elevated and abdominal x-ray was showed no acute finding. -Continue Reglan IV every 8 hourly with Zofran as needed for the management of nausea. -Increased IV fluid rate -Added morphine when necessary for abdominal pain. Patient reported that her pain improved with morphine in the past and it usually helps -If no improvement in symptoms with current management I will consider gastroenterology consult.  #Sinus tachycardia likely contributed by pain and possible dehydration: Patient received IV fluid bolus in the ER. I will increase the rate of maintenance IV fluid to 1 25 mL an hour. Added morphine for the pain management. EKG reviewed which reveals sinus tachycardia. Transfer patient to telemetry floor for close monitoring. Blood pressure acceptable. Patient denies chest pain, shortness of breath headache or dizziness. She has normal thyroid function test in May 2017.  #Uncontrolled type 1 diabetes with hyperglycemia: Start diabetic diet. Monitor blood sugar level. Continue current insulin regimen.  #Intractable nausea vomiting and abdominal pain: Continue management as above.  DVT prophylaxis: Low dose Lovenox subcutaneous Code Status: Full code Family Communication: No family present at bedside Disposition Plan: Likely  discharge home in 1-2 days   Consultants:   None  Procedures: None Antimicrobials: None  Subjective: The patient was seen and examined at bedside. Patient reported abdomen pain. Denied nausea vomiting, chest pain, shortness of breath, dizziness or lightheadedness. She feels general weak. Denied dysuria, urgency, frequency, diarrhea or constipation.   Objective: Vitals:   11/02/15 0152 11/02/15 0622 11/02/15 0741 11/02/15 0900  BP: 134/85 123/65 139/70 134/65  Pulse: (!) 119 (!) 137 (!) 134 (!) 131  Resp: 18 18  16   Temp:  99.1 F (37.3 C)  99.1 F (37.3 C)  TempSrc:  Axillary  Oral  SpO2: 100% 100% 100% 100%  Weight:        Intake/Output Summary (Last 24 hours) at 11/02/15 1005 Last data filed at 11/02/15 [id number]  Gross per 24 hour  Intake             1050 ml  Output             1300 ml  Net             -250 ml   Filed Weights   11/01/15 1624  Weight: 54.4 kg (120 lb)    Examination:  General exam: Pain young female lying comfortably Respiratory system: Clear to auscultation. Respiratory effort normal. Cardiovascular system: Tachycardia, regular, S1-S2 normal. No murmur or extra beat appreciated Gastrointestinal system: Abdomen is nondistended, soft and nontender. No organomegaly or masses felt. Normal bowel sounds heard. Central nervous system: Alert and oriented. No focal neurological deficits. Extremities: Symmetric 5 x 5 power. Skin: No rashes, lesions or ulcers Psychiatry: Judgement and insight appear normal. Mood & affect appropriate.     Data Reviewed: I have personally reviewed following labs and imaging studies  CBC:  Recent Labs Lab 11/01/15 1658 11/02/15 0536  WBC 12.2* 13.5*  HGB 15.1* 12.7  HCT 45.1 37.5  MCV 90.6 88.9  PLT 451* [id number]   Basic Metabolic Panel:  Recent Labs Lab 11/01/15 1658 11/01/15 1708 11/02/15 0536  NA 139  --  140  K 3.4*  --  4.2  CL 101  --  111  CO2 20*  --  13*  GLUCOSE 99  --  191*  BUN 14  --  10    CREATININE 0.74  --  0.64  CALCIUM 10.3  --  8.8*  MG  --  2.1  --    GFR: Estimated Creatinine Clearance: 91.5 mL/min (by C-G formula based on SCr of 0.64 mg/dL). Liver Function Tests:  Recent Labs Lab 11/01/15 1658  AST 42*  ALT 19  ALKPHOS 119  BILITOT 0.7  PROT 9.8*  ALBUMIN 5.2*    Recent Labs Lab 11/01/15 1658  LIPASE 12   No results for input(s): AMMONIA in the last 168 hours. Coagulation Profile: No results for input(s): INR, PROTIME in the last 168 hours. Cardiac Enzymes: No results for input(s): CKTOTAL, CKMB, CKMBINDEX, TROPONINI in the last 168 hours. BNP (last 3 results) No results for input(s): PROBNP in the last 8760 hours. HbA1C: No results for input(s): HGBA1C in the last 72 hours. CBG:  Recent Labs Lab 11/02/15 0057 11/02/15 0437 11/02/15 0754  GLUCAP 397* 175* 290*   Lipid Profile: No results for input(s): CHOL, HDL, LDLCALC, TRIG, CHOLHDL, LDLDIRECT in the last 72 hours. Thyroid Function Tests: No results for input(s): TSH, T4TOTAL, FREET4, T3FREE, THYROIDAB in the last 72 hours. Anemia Panel: No results for input(s): VITAMINB12, FOLATE, FERRITIN, TIBC, IRON, RETICCTPCT in the last 72 hours. Sepsis Labs: No results for input(s): PROCALCITON, LATICACIDVEN in the last 168 hours.  No results found for this or any previous visit (from the past 240 hour(s)).       Radiology Studies: Dg Abd 2 Views  Result Date: 11/01/2015 CLINICAL DATA:  Vomiting. Nausea and diffuse abdominal pain for 1 day. EXAM: ABDOMEN - 2 VIEW COMPARISON:  Radiographs 07/05/2015.  CT 04/21/2015 FINDINGS: No dilated bowel loops to suggest obstruction. Small volume of stool in the descending colon. There is otherwise generalized paucity of bowel gas. This is unchanged from scout tomogram from CT which demonstrated normal bowel. No free air. No radiopaque calculi. Calcifications in the pelvis are phleboliths. The lung bases are clear. There is no osseous abnormality.  IMPRESSION: Small stool burden.  No bowel dilatation to suggest obstruction. Electronically Signed   By: Jeb Levering M.D.   On: 11/01/2015 22:18        Scheduled Meds: . escitalopram  5 mg Oral Daily  . insulin aspart  0-9 Units Subcutaneous Q4H  . insulin glargine  24 Units Subcutaneous Q2200  . metoCLOPramide (REGLAN) injection  10 mg Intravenous Q8H  . montelukast  10 mg Oral Daily   Continuous Infusions: . 0.9 % NaCl with KCl 20 mEq / L 125 mL/hr at 11/02/15 0837     LOS: 0 days    Time spent: 28 minutes    Dron Tanna Furry, MD [facility] Pager [phone]  If 7PM-7AM, please contact night-coverage [email or web address] Password [email or web address] 11/02/2015, 10:05 AM

## 2015-11-02 NOTE — Progress Notes (Signed)
Called Lactic acid report to MD. SRP, RN

## 2015-11-02 NOTE — Progress Notes (Addendum)
Addendum:  Lactic acid mildly elevated can be explained by vomiting. No improvement in abdomen pain and still has tachycardia.  Plan: -500 NS bolus -CT abdomen and pelvis stat -added pepcid IV bid -continue pain medications and IVF -continue tele monitoring  Discussed with Rn.   Addendum: 6.15 PM  Patient was re-examined at bedside. Still has abdomen pain and nausea. Finished oral contrast and going for CT scan abdomen pelvis.  She denied dysuria, urgency, diarrhea, cough or SOB. Not hypoxic. Will hold off on antibiotics.   -f/u CT abdomen -will add bicarbonate in IVF. -procalcitonin level.

## 2015-11-02 NOTE — H&P (Signed)
History and Physical    [name] [id number] DOB: 02-Mar-1990 DOA: 11/01/2015  PCP: Vicenta Aly, FNP  Patient coming from:  home  Chief Complaint:   vomiting  HPI: [name] is a 25 y.o. female with medical history significant of DM1 since age 27 yo, gastroparesis, htn, pancreatitis, asthma, anxiety comes in with acute onset of intractable vomiting since 3pm earlier today.  She is compliant with her home meds for her gastroparesis except she use to be on a PPI and no longer is.  She denies fevers.  She has generalized abdominal pain with vomiting which is nonbloody.  No diarrhea.  She reports no change in her diet, this suddenly came on today typical for when she has a flare of her gastroparesis, she has not been hospitalized in 4 months for this.  Pt has been given reglan, phenergan, haldol, bentyl, pepcid and ativan all iv in the ED and she has not improved and is still wretching.  Pt being referred for admission for gastroparesis flare with intractable vomiting and pain.  Review of Systems: As per HPI otherwise 10 point review of systems negative.   Past Medical History:  Diagnosis Date  . Anxiety   . Arthritis   . Asthma   . Diabetes mellitus 2007   IDDM.  poorly controlled, multiple admits with DKA  . Gallstones   . Heart murmur   . Hepatic steatosis 11/26/2014   and hepatomegaly  . Hypertension    NOT CURRENTLY ON ANY BP MED  . Liver mass 11/26/2014  . Pancreatitis, acute 11/26/2014    Past Surgical History:  Procedure Laterality Date  . CHOLECYSTECTOMY N/A 02/11/2015   Procedure: LAPAROSCOPIC CHOLECYSTECTOMY WITH INTRAOPERATIVE CHOLANGIOGRAM;  Surgeon: Greer Pickerel, MD;  Location: [facility] ORS;  Service: General;  Laterality: N/A;  . ESOPHAGOGASTRODUODENOSCOPY (EGD) WITH PROPOFOL Left 09/20/2014   Procedure: ESOPHAGOGASTRODUODENOSCOPY (EGD) WITH PROPOFOL;  Surgeon: Arta Silence, MD;  Location: [facility] ENDOSCOPY;  Service: Endoscopy;  Laterality: Left;  . WISDOM  TOOTH EXTRACTION       reports that she has never smoked. She has never used smokeless tobacco. She reports that she uses drugs, including Marijuana. She reports that she does not drink alcohol.  Allergies  Allergen Reactions  . Peanut-Containing Drug Products Swelling    Swelling of mouth, lips  . Strawberry Extract Swelling    Swelling of mouth, lips  . Ultram [Tramadol] Itching    Family History  Problem Relation Age of Onset  . Heart disease Maternal Grandmother   . Heart disease Maternal Grandfather   . Diabetes Mother   . Hyperlipidemia Mother   . Hypertension Father   . Heart disease Father   . Hypertension Paternal Grandmother   . Cancer Paternal Grandfather     Prior to Admission medications   Medication Sig Start Date End Date Taking? Authorizing Provider  albuterol (PROVENTIL HFA;VENTOLIN HFA) 108 (90 BASE) MCG/ACT inhaler Inhale 2 puffs into the lungs every 6 (six) hours as needed for shortness of breath.    Historical Provider, MD  albuterol (PROVENTIL) (2.5 MG/3ML) 0.083% nebulizer solution Take 2.5 mg by nebulization every 6 (six) hours as needed for shortness of breath.    Historical Provider, MD  budesonide-formoterol (SYMBICORT) 160-4.5 MCG/ACT inhaler Inhale 2 puffs into the lungs daily.     Historical Provider, MD  EPINEPHrine (EPIPEN) 0.3 mg/0.3 mL SOAJ injection Inject 0.3 mg into the muscle once. As needed for allergic reaction. Never had to use before    Historical Provider, MD  escitalopram (LEXAPRO) 10 MG tablet Take 5 mg by mouth daily. 06/20/15 06/19/16  Historical Provider, MD  insulin aspart (NOVOLOG FLEXPEN) 100 UNIT/ML FlexPen INJECT 0-20 UNITS INTO THE SKIN AS DIRECTED. SSI - CARB SCALE, 0-20 UNITS Patient taking differently: Inject 0-20 Units into the skin 4 (four) times daily as needed for high blood sugar. Sliding scale 10/19/13   Elayne Snare, MD  Insulin Glargine (LANTUS SOLOSTAR) 100 UNIT/ML Solostar Pen Inject 24 Units into the skin daily at 10 pm.  05/25/15   Domenic Polite, MD  metoCLOPramide (REGLAN) 10 MG tablet Take 1 tablet (10 mg total) by mouth 3 (three) times daily before meals. 06/11/15   Shanker Kristeen Mans, MD  montelukast (SINGULAIR) 10 MG tablet Take 10 mg by mouth daily.    Historical Provider, MD  omeprazole (PRILOSEC) 40 MG capsule Take 1 capsule (40 mg total) by mouth daily. Patient not taking: Reported on 07/01/2015 04/23/15   Orson Eva, MD  ondansetron (ZOFRAN) 4 MG tablet Take 1 tablet (4 mg total) by mouth every 8 (eight) hours as needed for nausea or vomiting. 07/13/15   Thurnell Lose, MD  promethazine (PHENERGAN) 25 MG suppository Place 1 suppository (25 mg total) rectally every 6 (six) hours as needed for nausea or vomiting. 07/13/15   Thurnell Lose, MD  promethazine (PHENERGAN) 25 MG tablet Take 1 tablet (25 mg total) by mouth every 6 (six) hours as needed for nausea or vomiting. 07/09/15   Fredia Sorrow, MD    Physical Exam: Vitals:   11/01/15 1623 11/01/15 1624 11/01/15 2056  BP: 103/88  143/94  Pulse: (!) 121  116  Resp: 18  18  Temp: 98.3 F (36.8 C)    TempSrc: Oral    SpO2: 97%  100%  Weight:  54.4 kg (120 lb)    Constitutional:   In obvious discomfort, dry heaving during interview Vitals:   11/01/15 1623 11/01/15 1624 11/01/15 2056  BP: 103/88  143/94  Pulse: (!) 121  116  Resp: 18  18  Temp: 98.3 F (36.8 C)    TempSrc: Oral    SpO2: 97%  100%  Weight:  54.4 kg (120 lb)    Eyes: PERRL, lids and conjunctivae normal ENMT: Mucous membranes are moist. Posterior pharynx clear of any exudate or lesions.Normal dentition.  Neck: normal, supple, no masses, no thyromegaly Respiratory: clear to auscultation bilaterally, no wheezing, no crackles. Normal respiratory effort. No accessory muscle use.  Cardiovascular: Regular rate and rhythm, no murmurs / rubs / gallops. No extremity edema. 2+ pedal pulses. No carotid bruits.  Abdomen: no tenderness, no masses palpated. No hepatosplenomegaly. Bowel sounds  positive.  Musculoskeletal: no clubbing / cyanosis. No joint deformity upper and lower extremities. Good ROM, no contractures. Normal muscle tone.  Skin: no rashes, lesions, ulcers. No induration Neurologic: CN 2-12 grossly intact. Sensation intact, DTR normal. Strength 5/5 in all 4.  Psychiatric: Normal judgment and insight. Alert and oriented x 3. Normal mood.    Labs on Admission: I have personally reviewed following labs and imaging studies  CBC:  Recent Labs Lab 11/01/15 1658  WBC 12.2*  HGB 15.1*  HCT 45.1  MCV 90.6  PLT [id number]*   Basic Metabolic Panel:  Recent Labs Lab 11/01/15 1658  NA 139  K 3.4*  CL 101  CO2 20*  GLUCOSE 99  BUN 14  CREATININE 0.74  CALCIUM 10.3   GFR: Estimated Creatinine Clearance: 91.5 mL/min (by C-G formula based on SCr of 0.74 mg/dL). Liver  Function Tests:  Recent Labs Lab 11/01/15 1658  AST 42*  ALT 19  ALKPHOS 119  BILITOT 0.7  PROT 9.8*  ALBUMIN 5.2*    Recent Labs Lab 11/01/15 1658  LIPASE 12    Urine analysis:    Component Value Date/Time   COLORURINE YELLOW 11/01/2015 2055   APPEARANCEUR CLOUDY (A) 11/01/2015 2055   LABSPEC 1.034 (H) 11/01/2015 2055   PHURINE 6.0 11/01/2015 2055   GLUCOSEU >1000 (A) 11/01/2015 2055   GLUCOSEU >=1000 11/07/2012 1205   [place] 11/01/2015 2055   BILIRUBINUR NEGATIVE 11/01/2015 2055   KETONESUR >80 (A) 11/01/2015 2055   PROTEINUR 30 (A) 11/01/2015 2055   UROBILINOGEN 0.2 11/24/2014 1045   NITRITE NEGATIVE 11/01/2015 2055   LEUKOCYTESUR SMALL (A) 11/01/2015 2055     Radiological Exams on Admission: Dg Abd 2 Views  Result Date: 11/01/2015 CLINICAL DATA:  Vomiting. Nausea and diffuse abdominal pain for 1 day. EXAM: ABDOMEN - 2 VIEW COMPARISON:  Radiographs 07/05/2015.  CT 04/21/2015 FINDINGS: No dilated bowel loops to suggest obstruction. Small volume of stool in the descending colon. There is otherwise generalized paucity of bowel gas. This is unchanged from scout  tomogram from CT which demonstrated normal bowel. No free air. No radiopaque calculi. Calcifications in the pelvis are phleboliths. The lung bases are clear. There is no osseous abnormality. IMPRESSION: Small stool burden.  No bowel dilatation to suggest obstruction. Electronically Signed   By: Jeb Levering M.D.   On: 11/01/2015 22:18     Assessment/Plan 25 yo female with type 1 diabetes with gastroparesis exacerbation  Principal Problem:   Diabetic gastroparesis ([place])- abd is soft and nd.  Her labs are unrevealing with nml lfts and lipase.  This is all likely due to her gastroparesis.  The only thing she has not gotten that she usually does get and she says helps is morphine.  Will give her morphine 4mg  iv now.  Provide reglan iv at regular intervals along with zofran.    Active Problems:   Uncontrolled type 1 diabetes mellitus ([place])- not in DKA, but dehydrated.  Place on q 4 hours glucose checks and ssi   Asthma- cont her albuterol and singulair   Chronic diastolic heart failure ([place])- pt denies this history of chf, but is documented in her chart   Dehydration- given 2 liters of ivf bolus in ED, cont maintenance overnight with k repletion.  Check mag level.   DVT prophylaxis:  scds Code Status:   full Family Communication:  none Disposition Plan:  Per day team Consults called:   none Admission status:   observation   Sumiya Mamaril A MD [facility]  If 7PM-7AM, please contact night-coverage [email or web address] Password [email or web address]  11/02/2015, 12:40 AM

## 2015-11-02 NOTE — Progress Notes (Signed)
Dr Carolin Sicks notified patient heart rate in 130's.  Orders received.  EKG done physician reviewed.

## 2015-11-02 NOTE — ED Notes (Signed)
Gave report to RN on [address].

## 2015-11-03 DIAGNOSIS — Z794 Long term (current) use of insulin: Secondary | ICD-10-CM | POA: Diagnosis not present

## 2015-11-03 DIAGNOSIS — N3289 Other specified disorders of bladder: Secondary | ICD-10-CM | POA: Diagnosis not present

## 2015-11-03 DIAGNOSIS — F1290 Cannabis use, unspecified, uncomplicated: Secondary | ICD-10-CM | POA: Diagnosis present

## 2015-11-03 DIAGNOSIS — K219 Gastro-esophageal reflux disease without esophagitis: Secondary | ICD-10-CM | POA: Diagnosis present

## 2015-11-03 DIAGNOSIS — J45909 Unspecified asthma, uncomplicated: Secondary | ICD-10-CM | POA: Diagnosis present

## 2015-11-03 DIAGNOSIS — F419 Anxiety disorder, unspecified: Secondary | ICD-10-CM | POA: Diagnosis present

## 2015-11-03 DIAGNOSIS — Z7951 Long term (current) use of inhaled steroids: Secondary | ICD-10-CM | POA: Diagnosis not present

## 2015-11-03 DIAGNOSIS — E876 Hypokalemia: Secondary | ICD-10-CM | POA: Diagnosis not present

## 2015-11-03 DIAGNOSIS — E1043 Type 1 diabetes mellitus with diabetic autonomic (poly)neuropathy: Secondary | ICD-10-CM | POA: Diagnosis present

## 2015-11-03 DIAGNOSIS — R338 Other retention of urine: Secondary | ICD-10-CM

## 2015-11-03 DIAGNOSIS — R1110 Vomiting, unspecified: Secondary | ICD-10-CM | POA: Diagnosis present

## 2015-11-03 DIAGNOSIS — E860 Dehydration: Secondary | ICD-10-CM | POA: Diagnosis present

## 2015-11-03 DIAGNOSIS — Z833 Family history of diabetes mellitus: Secondary | ICD-10-CM | POA: Diagnosis not present

## 2015-11-03 DIAGNOSIS — E1010 Type 1 diabetes mellitus with ketoacidosis without coma: Secondary | ICD-10-CM | POA: Diagnosis present

## 2015-11-03 DIAGNOSIS — R339 Retention of urine, unspecified: Secondary | ICD-10-CM | POA: Diagnosis present

## 2015-11-03 DIAGNOSIS — I5032 Chronic diastolic (congestive) heart failure: Secondary | ICD-10-CM | POA: Diagnosis present

## 2015-11-03 DIAGNOSIS — I110 Hypertensive heart disease with heart failure: Secondary | ICD-10-CM | POA: Diagnosis present

## 2015-11-03 DIAGNOSIS — E10649 Type 1 diabetes mellitus with hypoglycemia without coma: Secondary | ICD-10-CM | POA: Diagnosis not present

## 2015-11-03 DIAGNOSIS — Z8249 Family history of ischemic heart disease and other diseases of the circulatory system: Secondary | ICD-10-CM | POA: Diagnosis not present

## 2015-11-03 DIAGNOSIS — K3184 Gastroparesis: Secondary | ICD-10-CM | POA: Diagnosis present

## 2015-11-03 DIAGNOSIS — N1330 Unspecified hydronephrosis: Secondary | ICD-10-CM | POA: Diagnosis present

## 2015-11-03 LAB — BASIC METABOLIC PANEL
ANION GAP: 11 (ref 5–15)
ANION GAP: 13 (ref 5–15)
ANION GAP: 9 (ref 5–15)
BUN: 7 mg/dL (ref 6–20)
BUN: 6 mg/dL (ref 6–20)
BUN: <5 mg/dL — ABNORMAL LOW (ref 6–20)
BUN: <5 mg/dL — ABNORMAL LOW (ref 6–20)
BUN: <5 mg/dL — ABNORMAL LOW (ref 6–20)
BUN: <5 mg/dL — ABNORMAL LOW (ref 6–20)
CALCIUM: 9.1 mg/dL (ref 8.9–10.3)
CALCIUM: 8.1 mg/dL — AB (ref 8.9–10.3)
CALCIUM: 8 mg/dL — AB (ref 8.9–10.3)
CALCIUM: 7.8 mg/dL — AB (ref 8.9–10.3)
CHLORIDE: 111 mmol/L (ref 101–111)
CHLORIDE: 110 mmol/L (ref 101–111)
CO2: <7 mmol/L — ABNORMAL LOW (ref 22–32)
CO2: <7 mmol/L — ABNORMAL LOW (ref 22–32)
CO2: <7 mmol/L — ABNORMAL LOW (ref 22–32)
CO2: 13 mmol/L — ABNORMAL LOW (ref 22–32)
CO2: 12 mmol/L — ABNORMAL LOW (ref 22–32)
CO2: 18 mmol/L — ABNORMAL LOW (ref 22–32)
CREATININE: 0.81 mg/dL (ref 0.44–1.00)
Calcium: 8.7 mg/dL — ABNORMAL LOW (ref 8.9–10.3)
Calcium: 8.3 mg/dL — ABNORMAL LOW (ref 8.9–10.3)
Chloride: 108 mmol/L (ref 101–111)
Chloride: 110 mmol/L (ref 101–111)
Chloride: 110 mmol/L (ref 101–111)
Chloride: 106 mmol/L (ref 101–111)
Creatinine, Ser: 0.88 mg/dL (ref 0.44–1.00)
Creatinine, Ser: 0.76 mg/dL (ref 0.44–1.00)
Creatinine, Ser: 0.55 mg/dL (ref 0.44–1.00)
Creatinine, Ser: 0.58 mg/dL (ref 0.44–1.00)
Creatinine, Ser: 0.51 mg/dL (ref 0.44–1.00)
GFR calc Af Amer: >60 mL/min (ref >60)
GFR calc Af Amer: >60 mL/min (ref >60)
GFR calc non Af Amer: >60 mL/min (ref >60)
GFR calc non Af Amer: >60 mL/min (ref >60)
GLUCOSE: 343 mg/dL — AB (ref 65–99)
GLUCOSE: 134 mg/dL — AB (ref 65–99)
GLUCOSE: 165 mg/dL — AB (ref 65–99)
GLUCOSE: 148 mg/dL — AB (ref 65–99)
Glucose, Bld: 203 mg/dL — ABNORMAL HIGH (ref 65–99)
Glucose, Bld: 278 mg/dL — ABNORMAL HIGH (ref 65–99)
POTASSIUM: 4.4 mmol/L (ref 3.5–5.1)
POTASSIUM: 4.8 mmol/L (ref 3.5–5.1)
POTASSIUM: 3.2 mmol/L — AB (ref 3.5–5.1)
POTASSIUM: 4.1 mmol/L (ref 3.5–5.1)
Potassium: 4.7 mmol/L (ref 3.5–5.1)
Potassium: 2.7 mmol/L — CL (ref 3.5–5.1)
SODIUM: 134 mmol/L — AB (ref 135–145)
SODIUM: 132 mmol/L — AB (ref 135–145)
SODIUM: 134 mmol/L — AB (ref 135–145)
SODIUM: 135 mmol/L (ref 135–145)
SODIUM: 133 mmol/L — AB (ref 135–145)
Sodium: 132 mmol/L — ABNORMAL LOW (ref 135–145)

## 2015-11-03 LAB — CBC
HCT: 40.2 % (ref 36.0–46.0)
HEMOGLOBIN: 13.7 g/dL (ref 12.0–15.0)
MCH: 31.4 pg (ref 26.0–34.0)
MCHC: 34.1 g/dL (ref 30.0–36.0)
MCV: 92 fL (ref 78.0–100.0)
Platelets: 397 10*3/uL (ref 150–400)
RBC: 4.37 MIL/uL (ref 3.87–5.11)
RDW: 13.7 % (ref 11.5–15.5)
WBC: 11.4 10*3/uL — ABNORMAL HIGH (ref 4.0–10.5)

## 2015-11-03 LAB — TROPONIN I

## 2015-11-03 LAB — MRSA PCR SCREENING: MRSA BY PCR: NEGATIVE

## 2015-11-03 LAB — GLUCOSE, CAPILLARY
GLUCOSE-CAPILLARY: 127 mg/dL — AB (ref 65–99)
GLUCOSE-CAPILLARY: 142 mg/dL — AB (ref 65–99)
GLUCOSE-CAPILLARY: 168 mg/dL — AB (ref 65–99)
GLUCOSE-CAPILLARY: 171 mg/dL — AB (ref 65–99)
GLUCOSE-CAPILLARY: 258 mg/dL — AB (ref 65–99)
GLUCOSE-CAPILLARY: 365 mg/dL — AB (ref 65–99)
Glucose-Capillary: 152 mg/dL — ABNORMAL HIGH (ref 65–99)
Glucose-Capillary: 127 mg/dL — ABNORMAL HIGH (ref 65–99)
Glucose-Capillary: 158 mg/dL — ABNORMAL HIGH (ref 65–99)
Glucose-Capillary: 232 mg/dL — ABNORMAL HIGH (ref 65–99)
Glucose-Capillary: 261 mg/dL — ABNORMAL HIGH (ref 65–99)
Glucose-Capillary: 271 mg/dL — ABNORMAL HIGH (ref 65–99)
Glucose-Capillary: 194 mg/dL — ABNORMAL HIGH (ref 65–99)
Glucose-Capillary: 124 mg/dL — ABNORMAL HIGH (ref 65–99)
Glucose-Capillary: 82 mg/dL (ref 65–99)
Glucose-Capillary: 182 mg/dL — ABNORMAL HIGH (ref 65–99)
Glucose-Capillary: 153 mg/dL — ABNORMAL HIGH (ref 65–99)
Glucose-Capillary: 143 mg/dL — ABNORMAL HIGH (ref 65–99)

## 2015-11-03 LAB — URINALYSIS, ROUTINE W REFLEX MICROSCOPIC
Leukocytes, UA: NEGATIVE
Nitrite: NEGATIVE
PROTEIN: 30 mg/dL — AB
Specific Gravity, Urine: 1.022 (ref 1.005–1.030)
pH: 5.5 (ref 5.0–8.0)

## 2015-11-03 LAB — URINE MICROSCOPIC-ADD ON

## 2015-11-03 LAB — PHOSPHORUS: Phosphorus: 1.5 mg/dL — ABNORMAL LOW (ref 2.5–4.6)

## 2015-11-03 LAB — TSH: TSH: 1.316 u[IU]/mL (ref 0.350–4.500)

## 2015-11-03 LAB — D-DIMER, QUANTITATIVE (NOT AT ARMC): D DIMER QUANT: 0.28 ug{FEU}/mL (ref 0.00–0.50)

## 2015-11-03 LAB — MAGNESIUM: Magnesium: 1.9 mg/dL (ref 1.7–2.4)

## 2015-11-03 MED ORDER — SODIUM CHLORIDE 0.9 % IV SOLN
INTRAVENOUS | Status: AC
Start: 2015-11-03 — End: 2015-11-03
  Administered 2015-11-03: 04:00:00 via INTRAVENOUS

## 2015-11-03 MED ORDER — DEXTROSE-NACL 5-0.45 % IV SOLN
INTRAVENOUS | Status: DC
  Administered 2015-11-03 – 2015-11-04 (×3): via INTRAVENOUS

## 2015-11-03 MED ORDER — MORPHINE SULFATE (PF) 2 MG/ML IV SOLN
2.0000 mg | INTRAVENOUS | Status: DC | PRN
  Administered 2015-11-03 – 2015-11-06 (×12): 2 mg via INTRAVENOUS
  Filled 2015-11-03 (×12): qty 1

## 2015-11-03 MED ORDER — SODIUM CHLORIDE 0.9 % IV SOLN
INTRAVENOUS | Status: DC
  Administered 2015-11-03: 04:00:00 via INTRAVENOUS

## 2015-11-03 MED ORDER — PROMETHAZINE HCL 25 MG/ML IJ SOLN
6.2500 mg | Freq: Four times a day (QID) | INTRAMUSCULAR | Status: DC | PRN
  Administered 2015-11-03 – 2015-11-04 (×4): 6.25 mg via INTRAVENOUS
  Filled 2015-11-03 (×4): qty 1

## 2015-11-03 MED ORDER — INSULIN ASPART 100 UNIT/ML ~~LOC~~ SOLN: 0.0000 [IU] | Freq: Three times a day (TID) | SUBCUTANEOUS | Status: DC

## 2015-11-03 MED ORDER — SODIUM CHLORIDE 0.9 % IV SOLN
INTRAVENOUS | Status: DC
  Administered 2015-11-03: 2 [IU]/h via INTRAVENOUS
  Filled 2015-11-03: qty 2.5

## 2015-11-03 MED ORDER — DEXTROSE 5 % IV SOLN
40.0000 meq | Freq: Once | INTRAVENOUS | Status: DC
  Filled 2015-11-03: qty 9.09

## 2015-11-03 MED ORDER — MORPHINE SULFATE (PF) 4 MG/ML IV SOLN: 4.0000 mg | INTRAVENOUS | Status: DC | PRN

## 2015-11-03 MED ORDER — MAGNESIUM SULFATE 2 GM/50ML IV SOLN
2.0000 g | Freq: Once | INTRAVENOUS | Status: AC
  Administered 2015-11-03: 2 g via INTRAVENOUS
  Filled 2015-11-03: qty 50

## 2015-11-03 MED ORDER — ENOXAPARIN SODIUM 40 MG/0.4ML ~~LOC~~ SOLN
40.0000 mg | SUBCUTANEOUS | Status: DC
Start: 2015-11-03 — End: 2015-11-06
  Administered 2015-11-03 – 2015-11-05 (×3): 40 mg via SUBCUTANEOUS
  Filled 2015-11-03 (×3): qty 0.4

## 2015-11-03 MED ORDER — SODIUM PHOSPHATES 45 MMOLE/15ML IV SOLN
40.0000 meq | Freq: Once | INTRAVENOUS | Status: AC
  Administered 2015-11-03: 40 meq via INTRAVENOUS
  Filled 2015-11-03: qty 10

## 2015-11-03 MED ORDER — POTASSIUM CHLORIDE 10 MEQ/100ML IV SOLN
10.0000 meq | INTRAVENOUS | Status: AC
  Administered 2015-11-03 (×2): 10 meq via INTRAVENOUS
  Filled 2015-11-03 (×2): qty 100

## 2015-11-03 MED ORDER — NALOXONE HCL 0.4 MG/ML IJ SOLN
INTRAMUSCULAR | Status: DC
Start: 2015-11-03 — End: 2015-11-03
  Filled 2015-11-03: qty 1

## 2015-11-03 MED ORDER — INSULIN ASPART 100 UNIT/ML ~~LOC~~ SOLN: 0.0000 [IU] | Freq: Every day | SUBCUTANEOUS | Status: DC

## 2015-11-03 NOTE — Progress Notes (Signed)
CRITICAL VALUE ALERT  Critical value received: K+ 2.7  Date of notification: 10/15  Time of notification: 2356  Critical value read back:Yes.    Nurse who received alert:  Darrin Nipper, RN  MD notified (1st page): [facility] on call  Time of first page: 2358

## 2015-11-03 NOTE — Procedures (Signed)
Unable to result venous blood gas in epic. Called results to pts RN. Results are: Ph -7.083 Co2 - below reportable range(less than 19) p02 -45.9 ctHb - 13.7 Fo2Hb -1.4 FMetHb -1.1 So2- 74.2

## 2015-11-03 NOTE — Progress Notes (Addendum)
PROGRESS NOTE    [name]  [id number] DOB: 02/10/1990 DOA: 11/01/2015 PCP: Vicenta Aly, FNP    Brief Narrative:  25 y.o. female with medical history significant of DM1 since age 45 yo, gastroparesis, htn, pancreatitis, asthma, anxiety comes in with acute onset of intractable vomiting for one day. CT abdomen pelvis showed bladder distention and b/l mild hydronephrosis. S/p foley catheter insertion with 1300 cc of urine output. Developed DKA and transferred to step down.  Assessment & Plan:  # Acute urinary retention with severe bladder distention and mild b/l hydronephrosis: Patient presented with nausea vomiting and abdominal pain. CT scan of abdomen revealed. Exact etiology of urinary retention unknown, may be contributed by medications. Status post Foley catheter insertion with significant amount of urine output. Patient is clinically improving. Continue IV fluid and supportive care.  #Uncontrolled type 1 diabetes with diabetic ketoacidosis without coma: Continue IV fluid, insulin drip. Monitor BMP and electrolytes and blood sugar level closely. -added mg and phos level   #  Diabetic gastroparesis [facility]): has nausea and vomiting.  -Continue Reglan IV every 8 hourly with Zofran as needed for the management of nausea. Added phenergan prn.  -morphine prn for pain control -continue supportive care -currently npo  #Sinus tachycardia due to pain and DKA: HR improving. EKG showed sinus tachy. Monitor electrolytes.  -on telemonitor. -d-dimer not elevated. Pro-calcitonin level unremarkable.  #Intractable nausea vomiting and abdominal pain: Continue management as above.  DVT prophylaxis: Low dose Lovenox subcutaneous Code Status: Full code Family Communication: No family present at bedside Disposition Plan: Likely discharge home in 1-2 days   Consultants:   None  Procedures: None Antimicrobials: None  Subjective: The patient was seen and examined at bedside.  Overnight events noted. Patient was transferred to stepdown. Patient reported nausea vomiting and abdominal pain. The pain is improving after insertion of Foley catheter. Denied chest pain or shortness of breath, fever, chills, cough, diarrhea, constipation.  Objective: Vitals:   11/03/15 0600 11/03/15 0800 11/03/15 0900 11/03/15 1000  BP: (!) 143/87 (!) 158/88 (!) 151/97 (!) 151/101  Pulse: (!) 123 (!) 132 (!) 125 (!) 133  Resp: (!) 21 19 (!) 42 18  Temp:  97.9 F (36.6 C)    TempSrc:  Oral    SpO2: 100% 100% 100% 100%  Weight:      Height:        Intake/Output Summary (Last 24 hours) at 11/03/15 1049 Last data filed at 11/03/15 1000  Gross per 24 hour  Intake           5479.9 ml  Output             2800 ml  Net           2679.9 ml   Filed Weights   11/01/15 1624 11/02/15 0900 11/03/15 0330  Weight: 54.4 kg (120 lb) 54.4 kg (120 lb) 54.4 kg (119 lb 14.9 oz)    Examination:  General exam: Thin young female lying on. And reporting abdominal pain Respiratory system: Clear to auscultation bilateral, no wheezing or crackle Cardiovascular system: Sinus tachycardia, S1-S2 normal. No murmur appreciated Gastrointestinal system: Abdomen is soft, nontender and not distended. Bowel sound positive.  Central nervous system: Alert, awake, oriented. No focal neurological deficit. Extremities: Symmetric 5 x 5 power. Skin: No rash or ulcer Psychiatry: Judgement and insight appear normal. Mood & affect appropriate.     Data Reviewed: I have personally reviewed following labs and imaging studies  CBC:  Recent Labs Lab 11/01/15 1658  11/02/15 0536 11/03/15 0323  WBC 12.2* 13.5* 11.4*  HGB 15.1* 12.7 13.7  HCT 45.1 37.5 40.2  MCV 90.6 88.9 92.0  PLT 451* 382 [id number]   Basic Metabolic Panel:  Recent Labs Lab 11/01/15 1658 11/01/15 1708 11/02/15 0536 11/03/15 0146 11/03/15 0323 11/03/15 0728  NA 139  --  140 132* 134* 132*  K 3.4*  --  4.2 4.7 4.4 4.8  CL 101  --  111 108 111  110  CO2 20*  --  13* <7* <7* <7*  GLUCOSE 99  --  191* 343* 203* 278*  BUN 14  --  10 7 6  <5*  CREATININE 0.74  --  0.64 0.81 0.88 0.76  CALCIUM 10.3  --  8.8* 9.1 8.7* 8.3*  MG  --  2.1  --   --   --   --    GFR: Estimated Creatinine Clearance: 88.2 mL/min (by C-G formula based on SCr of 0.76 mg/dL). Liver Function Tests:  Recent Labs Lab 11/01/15 1658  AST 42*  ALT 19  ALKPHOS 119  BILITOT 0.7  PROT 9.8*  ALBUMIN 5.2*    Recent Labs Lab 11/01/15 1658  LIPASE 12   No results for input(s): AMMONIA in the last 168 hours. Coagulation Profile: No results for input(s): INR, PROTIME in the last 168 hours. Cardiac Enzymes:  Recent Labs Lab 11/03/15 0323  TROPONINI <0.03   BNP (last 3 results) No results for input(s): PROBNP in the last 8760 hours. HbA1C: No results for input(s): HGBA1C in the last 72 hours. CBG:  Recent Labs Lab 11/03/15 0522 11/03/15 0628 11/03/15 0730 11/03/15 0847 11/03/15 0926  GLUCAP 127* 158* 232* 271* 261*   Lipid Profile: No results for input(s): CHOL, HDL, LDLCALC, TRIG, CHOLHDL, LDLDIRECT in the last 72 hours. Thyroid Function Tests:  Recent Labs  11/03/15 0323  TSH 1.316   Anemia Panel: No results for input(s): VITAMINB12, FOLATE, FERRITIN, TIBC, IRON, RETICCTPCT in the last 72 hours. Sepsis Labs:  Recent Labs Lab 11/02/15 1020 11/02/15 1315 11/02/15 2038  PROCALCITON  --   --  0.14  LATICACIDVEN 2.9* 1.3  --     Recent Results (from the past 240 hour(s))  MRSA PCR Screening     Status: None   Collection Time: 11/03/15  3:06 AM  Result Value Ref Range Status   MRSA by PCR NEGATIVE NEGATIVE Final    Comment:        The GeneXpert MRSA Assay (FDA approved for NASAL specimens only), is one component of a comprehensive MRSA colonization surveillance program. It is not intended to diagnose MRSA infection nor to guide or monitor treatment for MRSA infections.          Radiology Studies: Ct Abdomen Pelvis  Wo Contrast  Result Date: 11/02/2015 CLINICAL DATA:  25 y.o. female with medical history significant of DM1 since age 24 yo, gastroparesis, htn, pancreatitis, asthma, anxiety comes in with acute onset of intractable vomiting since 3pm earlier. Generalized abdominal pain with vomiting. Patient being referred for admission for gastroparesis with intractable vomiting and pain. : CT ABDOMEN AND PELVIS WITHOUT CONTRAST TECHNIQUE: Multidetector CT imaging of the abdomen and pelvis was performed following the standard protocol without IV contrast. COMPARISON:  CT abdomen dated 04/21/2015 and CT abdomen dated 11/25/2014. FINDINGS: Lower chest: No acute abnormality. Hepatobiliary: Characterization of the liver is limited without intravascular contrast. The 2 dominant lesions within the right liver lobe, better demonstrated on earlier contrast enhanced CT of 04/21/2015, are grossly stable.  The smaller hypodense lesion within the left liver lobe is also grossly stable. No new liver lesions identified. Patient is status post cholecystectomy. No bile duct dilatation appreciated. Pancreas: Unremarkable. No pancreatic ductal dilatation or surrounding inflammatory changes. Spleen: Normal in size without focal abnormality. Adrenals/Urinary Tract: Adrenal glands appear normal. Bladder is prominently distended. Mild bilateral hydronephrosis, right greater the left, likely related to the bladder distension. No renal, ureteral or bladder calculi identified. Stomach/Bowel: Bowel is normal in caliber. No bowel wall thickening or evidence of bowel wall inflammation seen. Appendix appears normal. Appendix is not seen. Vascular/Lymphatic: No significant vascular findings are present. No enlarged abdominal or pelvic lymph nodes. Reproductive: Unremarkable. Other: No free fluid or abscess collections seen. No free intraperitoneal air. Musculoskeletal: No osseous abnormality. Superficial soft tissues are unremarkable. IMPRESSION: 1. Fairly  severe bladder distension, extending into the mid abdomen. Mild bilateral hydronephrosis, right greater than left, presumably related to the bladder distension. No perinephric inflammation. No renal, ureteral or bladder calculi. 2. Three liver lesions appear grossly stable compared to the earlier exams, better demonstrated on the earlier contrast enhanced CT and MRI studies. 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 Per the MRI report of 11/27/2014, a recommended six-month follow-up MRI was due in May of 2017. 3. Remainder of the abdomen and pelvis CT is unremarkable. No bowel obstruction or evidence of bowel wall inflammation seen. No free fluid. Electronically Signed   By: Franki Cabot M.D.   On: 11/02/2015 19:39   Dg Abd 2 Views  Result Date: 11/01/2015 CLINICAL DATA:  Vomiting. Nausea and diffuse abdominal pain for 1 day. EXAM: ABDOMEN - 2 VIEW COMPARISON:  Radiographs 07/05/2015.  CT 04/21/2015 FINDINGS: No dilated bowel loops to suggest obstruction. Small volume of stool in the descending colon. There is otherwise generalized paucity of bowel gas. This is unchanged from scout tomogram from CT which demonstrated normal bowel. No free air. No radiopaque calculi. Calcifications in the pelvis are phleboliths. The lung bases are clear. There is no osseous abnormality. IMPRESSION: Small stool burden.  No bowel dilatation to suggest obstruction. Electronically Signed   By: Jeb Levering M.D.   On: 11/01/2015 22:18        Scheduled Meds: . enoxaparin (LOVENOX) injection  40 mg Subcutaneous Q24H  . escitalopram  5 mg Oral Daily  . famotidine (PEPCID) IV  20 mg Intravenous Q12H  . insulin glargine  24 Units Subcutaneous Q2200  . metoCLOPramide (REGLAN) injection  10 mg Intravenous Q8H  . montelukast  10 mg Oral Daily   Continuous Infusions: . sodium chloride 200 mL/hr at 11/03/15 1000  . dextrose 5 % and 0.45% NaCl 75 mL/hr at 11/03/15 1000  . insulin  (NOVOLIN-R) infusion 5.4 Units/hr (11/03/15 1030)  .  sodium bicarbonate infusion 1/4 NS 1000 mL 100 mL/hr at 11/03/15 1000     LOS: 0 days    Time spent: 30 minutes    Tamzin Bertling Tanna Furry, MD [facility] Pager [phone]  If 7PM-7AM, please contact night-coverage [email or web address] Password [email or web address] 11/03/2015, 10:49 AM

## 2015-11-03 NOTE — Progress Notes (Signed)
Dr. Shanon Brow called this RN close to the beginning of the shift and stated the patient's CT scan of the abdomen showed severe bladder distention. Patient urinating, and not complaining of any problems with urination. Bladder scan was done on patient after patient voided 400cc. It showed greater than 999. Dr. Shanon Brow was called back and new orders were given for a foley and to bladder scan every 6 hrs. Foley placed and 850cc emptied immediately. After foley was in place for a while, patient is now asleep.

## 2015-11-03 NOTE — Progress Notes (Signed)
Around 0100, patient called out stating she felt like she was going to vomit. RN went to get nausea medicine and sent NT in to get patient's BS. NT called out and stated patient felt like she was going to faint. Right after that she passed out and multiple nurses ran into the room. Patient came to with sternal rubbing. Rapid response nurse was called and on call was notified. BP was 148/88, 100% on 2L, and HR in the 130's. On call MD called ED doctor to come up and look at patient. Patient passed out one more time while rapid response RN was in the room and came to with sternal rubbing. BS was 365, so RN gave the scheduled dose of insulin and also gave patient zofran IV. Lab work was ordered and a UA was ordered. At 0245, RRT notified RN of blood gas results and then RN notified Dr. Maudie Mercury of results and Dr. Maudie Mercury gave transfer orders for ICU. RN brought patient to 1222 and called patient's to let her know.

## 2015-11-04 DIAGNOSIS — E0810 Diabetes mellitus due to underlying condition with ketoacidosis without coma: Secondary | ICD-10-CM

## 2015-11-04 LAB — BASIC METABOLIC PANEL
ANION GAP: 8 (ref 5–15)
ANION GAP: 8 (ref 5–15)
ANION GAP: 5 (ref 5–15)
BUN: <5 mg/dL — ABNORMAL LOW (ref 6–20)
BUN: <5 mg/dL — ABNORMAL LOW (ref 6–20)
CALCIUM: 7.9 mg/dL — AB (ref 8.9–10.3)
CALCIUM: 7.9 mg/dL — AB (ref 8.9–10.3)
CHLORIDE: 109 mmol/L (ref 101–111)
CO2: 22 mmol/L (ref 22–32)
CO2: 23 mmol/L (ref 22–32)
CO2: 26 mmol/L (ref 22–32)
CREATININE: 0.48 mg/dL (ref 0.44–1.00)
Calcium: 7.9 mg/dL — ABNORMAL LOW (ref 8.9–10.3)
Chloride: 108 mmol/L (ref 101–111)
Chloride: 106 mmol/L (ref 101–111)
Creatinine, Ser: 0.47 mg/dL (ref 0.44–1.00)
Creatinine, Ser: 0.52 mg/dL (ref 0.44–1.00)
GFR calc Af Amer: >60 mL/min (ref >60)
GFR calc non Af Amer: >60 mL/min (ref >60)
GFR calc non Af Amer: >60 mL/min (ref >60)
GLUCOSE: 152 mg/dL — AB (ref 65–99)
GLUCOSE: 162 mg/dL — AB (ref 65–99)
Glucose, Bld: 165 mg/dL — ABNORMAL HIGH (ref 65–99)
POTASSIUM: 3 mmol/L — AB (ref 3.5–5.1)
POTASSIUM: 3.2 mmol/L — AB (ref 3.5–5.1)
Potassium: 3.1 mmol/L — ABNORMAL LOW (ref 3.5–5.1)
SODIUM: 137 mmol/L (ref 135–145)
Sodium: 138 mmol/L (ref 135–145)
Sodium: 140 mmol/L (ref 135–145)

## 2015-11-04 LAB — GLUCOSE, CAPILLARY
GLUCOSE-CAPILLARY: 152 mg/dL — AB (ref 65–99)
GLUCOSE-CAPILLARY: 150 mg/dL — AB (ref 65–99)
GLUCOSE-CAPILLARY: 153 mg/dL — AB (ref 65–99)
GLUCOSE-CAPILLARY: 145 mg/dL — AB (ref 65–99)
GLUCOSE-CAPILLARY: 153 mg/dL — AB (ref 65–99)
GLUCOSE-CAPILLARY: 178 mg/dL — AB (ref 65–99)
GLUCOSE-CAPILLARY: 140 mg/dL — AB (ref 65–99)
GLUCOSE-CAPILLARY: 155 mg/dL — AB (ref 65–99)
GLUCOSE-CAPILLARY: 164 mg/dL — AB (ref 65–99)
GLUCOSE-CAPILLARY: 327 mg/dL — AB (ref 65–99)
Glucose-Capillary: 124 mg/dL — ABNORMAL HIGH (ref 65–99)
Glucose-Capillary: 126 mg/dL — ABNORMAL HIGH (ref 65–99)
Glucose-Capillary: 135 mg/dL — ABNORMAL HIGH (ref 65–99)
Glucose-Capillary: 141 mg/dL — ABNORMAL HIGH (ref 65–99)
Glucose-Capillary: 150 mg/dL — ABNORMAL HIGH (ref 65–99)
Glucose-Capillary: 174 mg/dL — ABNORMAL HIGH (ref 65–99)
Glucose-Capillary: 194 mg/dL — ABNORMAL HIGH (ref 65–99)
Glucose-Capillary: 206 mg/dL — ABNORMAL HIGH (ref 65–99)

## 2015-11-04 LAB — PHOSPHORUS: Phosphorus: 1.4 mg/dL — ABNORMAL LOW (ref 2.5–4.6)

## 2015-11-04 LAB — MAGNESIUM: Magnesium: 1.8 mg/dL (ref 1.7–2.4)

## 2015-11-04 MED ORDER — POTASSIUM CHLORIDE 10 MEQ/100ML IV SOLN
10.0000 meq | INTRAVENOUS | Status: AC
  Administered 2015-11-04 (×4): 10 meq via INTRAVENOUS
  Filled 2015-11-04 (×3): qty 100

## 2015-11-04 MED ORDER — MAGNESIUM SULFATE 2 GM/50ML IV SOLN
2.0000 g | Freq: Once | INTRAVENOUS | Status: AC
  Administered 2015-11-04: 2 g via INTRAVENOUS
  Filled 2015-11-04: qty 50

## 2015-11-04 MED ORDER — POTASSIUM CHLORIDE 20 MEQ/15ML (10%) PO SOLN: 40.0000 meq | Freq: Once | ORAL | Status: DC

## 2015-11-04 MED ORDER — INSULIN ASPART 100 UNIT/ML ~~LOC~~ SOLN
0.0000 [IU] | Freq: Three times a day (TID) | SUBCUTANEOUS | Status: DC
  Administered 2015-11-04: 2 [IU] via SUBCUTANEOUS

## 2015-11-04 MED ORDER — POTASSIUM PHOSPHATE MONOBASIC 500 MG PO TABS
500.0000 mg | ORAL_TABLET | Freq: Three times a day (TID) | ORAL | Status: DC
  Administered 2015-11-04 – 2015-11-06 (×7): 500 mg via ORAL
  Filled 2015-11-04 (×11): qty 1

## 2015-11-04 MED ORDER — POTASSIUM CHLORIDE 10 MEQ/100ML IV SOLN
10.0000 meq | INTRAVENOUS | Status: DC
  Filled 2015-11-04: qty 100

## 2015-11-04 MED ORDER — SODIUM CHLORIDE 0.9 % IV SOLN
INTRAVENOUS | Status: DC
  Administered 2015-11-04 – 2015-11-05 (×3): via INTRAVENOUS

## 2015-11-04 MED ORDER — INSULIN ASPART 100 UNIT/ML ~~LOC~~ SOLN
0.0000 [IU] | SUBCUTANEOUS | Status: DC
  Administered 2015-11-04: 11 [IU] via SUBCUTANEOUS
  Administered 2015-11-04 – 2015-11-05 (×2): 3 [IU] via SUBCUTANEOUS
  Administered 2015-11-05: 5 [IU] via SUBCUTANEOUS
  Administered 2015-11-05: 2 [IU] via SUBCUTANEOUS

## 2015-11-04 MED ORDER — POTASSIUM CHLORIDE 10 MEQ/100ML IV SOLN: 10.0000 meq | INTRAVENOUS | Status: DC

## 2015-11-04 NOTE — Progress Notes (Signed)
Received pt from ICU, pt stable no distress noted. SRP <RN

## 2015-11-04 NOTE — Progress Notes (Signed)
PROGRESS NOTE    [name]  [id number] DOB: 1990/12/24 DOA: 11/01/2015 PCP: Vicenta Aly, FNP    Brief Narrative:  25 y.o. female with medical history significant of DM1 since age 60 yo, gastroparesis, htn, pancreatitis, asthma, anxiety comes in with acute onset of intractable vomiting for one day. CT abdomen pelvis showed bladder distention and b/l mild hydronephrosis. S/p foley catheter insertion with 1300 cc of urine output. Developed DKA and transferred to step down.  Assessment & Plan:  # Acute urinary retention with severe bladder distention and mild b/l hydronephrosis: Patient presented with nausea vomiting and abdominal pain. CT scan of abdomen revealed. Exact etiology of urinary retention unknown, may be contributed by medications. Status post Foley catheter insertion with significant amount of urine output. Patient is clinically improving. Continue IV fluid and supportive care. -continue foley for now, at step down unit.  #Uncontrolled type 1 diabetes with diabetic ketoacidosis without coma: required insulin drip last night. Received lantus last night. Try to wean off drip.  -continue IVF, KCL repletion and close monitoring of lab -start clear liquid diet when she is off insulin drip.  I discussed with patient's nurses in detail.  # Hypokalemia, hypophosphatemia: Added Kphos, KCL IV and oral if tolerated oral.    #  Diabetic gastroparesis ([place]): has nausea and vomiting.  -Continue Reglan IV every 8 hourly with Zofran and phenergan as needed for the management of nausea.  -morphine prn for pain control -continue supportive care -if no improvement in abdomen pain after treatment of DKA and urinary retention, I will consider GI consult. No gastroparesis study while patient is on reglan.   #Sinus tachycardia due to pain and DKA: HR improving. EKG showed sinus tachy. Monitor electrolytes.  -on telemonitor. -d-dimer not elevated. Pro-calcitonin level  unremarkable.  #Intractable nausea vomiting and abdominal pain: Continue management as above.  DVT prophylaxis: Low dose Lovenox subcutaneous Code Status: Full code Family Communication: No family present at bedside Disposition Plan: Likely discharge home in 1-2 days   Consultants:   None  Procedures: None Antimicrobials: None  Subjective: The patient was seen and examined at bedside. Patient continued to have nausea vomiting and abdominal pain. Heart rate is better controlled. Patient looks clinically better. Denied headache, dizziness, chills, chest pain, shortness of breath.  Objective: Vitals:   11/04/15 0400 11/04/15 0500 11/04/15 0600 11/04/15 0800  BP: (!) 150/93  (!) 152/101 (!) 151/107  Pulse: (!) 105 (!) 109 (!) 107 95  Resp: 14 16 15 19   Temp: 98.7 F (37.1 C)   98.5 F (36.9 C)  TempSrc: Oral   Oral  SpO2: 99% 99% 99% 99%  Weight:      Height:        Intake/Output Summary (Last 24 hours) at 11/04/15 1051 Last data filed at 11/04/15 0525  Gross per 24 hour  Intake          3644.96 ml  Output             3250 ml  Net           394.96 ml   Filed Weights   11/01/15 1624 11/02/15 0900 11/03/15 0330  Weight: 54.4 kg (120 lb) 54.4 kg (120 lb) 54.4 kg (119 lb 14.9 oz)    Examination:  General exam: Thin female complaining of nausea vomiting and pain. Respiratory system: Clear to auscultation bilateral, no wheezing or crackle  Cardiovascular system: Sinus tachycardia, S1 and S2 normal. No murmur appreciated Gastrointestinal system: Abdomen is soft, nontender and  nondistended. Bowel sound positive. Foley catheter and bag with clear urine Central nervous system: Alert, awake, oriented. No focal deficit.  Extremities: Symmetric 5 x 5 power. Skin: No rash or ulcer Psychiatry: Judgement and insight appear normal. Mood & affect appropriate.     Data Reviewed: I have personally reviewed following labs and imaging studies  CBC:  Recent Labs Lab 11/01/15 1658  11/02/15 0536 11/03/15 0323  WBC 12.2* 13.5* 11.4*  HGB 15.1* 12.7 13.7  HCT 45.1 37.5 40.2  MCV 90.6 88.9 92.0  PLT 451* 382 [id number]   Basic Metabolic Panel:  Recent Labs Lab 11/01/15 1708  11/03/15 0728 11/03/15 1544 11/03/15 1900 11/03/15 2315 11/04/15 0345 11/04/15 0723  NA  --   < > 132* 134* 135 133* 138 137  K  --   < > 4.8 3.2* 4.1 2.7* 3.0* 3.1*  CL  --   < > 110 110 110 106 108 106  CO2  --   < > <7* 13* 12* 18* 22 23  GLUCOSE  --   < > 278* 134* 165* 148* 152* 165*  BUN  --   < > <5* <5* <5* <5* <5* <5*  CREATININE  --   < > 0.76 0.55 0.58 0.51 0.47 0.48  CALCIUM  --   < > 8.3* 8.1* 8.0* 7.8* 7.9* 7.9*  MG 2.1  --  1.9  --   --   --  1.8  --   PHOS  --   --  1.5*  --   --   --  1.4*  --   < > = values in this interval not displayed. GFR: Estimated Creatinine Clearance: 88.2 mL/min (by C-G formula based on SCr of 0.48 mg/dL). Liver Function Tests:  Recent Labs Lab 11/01/15 1658  AST 42*  ALT 19  ALKPHOS 119  BILITOT 0.7  PROT 9.8*  ALBUMIN 5.2*    Recent Labs Lab 11/01/15 1658  LIPASE 12   No results for input(s): AMMONIA in the last 168 hours. Coagulation Profile: No results for input(s): INR, PROTIME in the last 168 hours. Cardiac Enzymes:  Recent Labs Lab 11/03/15 0323  TROPONINI <0.03   BNP (last 3 results) No results for input(s): PROBNP in the last 8760 hours. HbA1C: No results for input(s): HGBA1C in the last 72 hours. CBG:  Recent Labs Lab 11/04/15 0522 11/04/15 0628 11/04/15 0731 11/04/15 0843 11/04/15 0921  GLUCAP 153* 178* 174* 155* 140*   Lipid Profile: No results for input(s): CHOL, HDL, LDLCALC, TRIG, CHOLHDL, LDLDIRECT in the last 72 hours. Thyroid Function Tests:  Recent Labs  11/03/15 0323  TSH 1.316   Anemia Panel: No results for input(s): VITAMINB12, FOLATE, FERRITIN, TIBC, IRON, RETICCTPCT in the last 72 hours. Sepsis Labs:  Recent Labs Lab 11/02/15 1020 11/02/15 1315 11/02/15 2038  PROCALCITON  --    --  0.14  LATICACIDVEN 2.9* 1.3  --     Recent Results (from the past 240 hour(s))  MRSA PCR Screening     Status: None   Collection Time: 11/03/15  3:06 AM  Result Value Ref Range Status   MRSA by PCR NEGATIVE NEGATIVE Final    Comment:        The GeneXpert MRSA Assay (FDA approved for NASAL specimens only), is one component of a comprehensive MRSA colonization surveillance program. It is not intended to diagnose MRSA infection nor to guide or monitor treatment for MRSA infections.          Radiology  Studies: Ct Abdomen Pelvis Wo Contrast  Result Date: 11/02/2015 CLINICAL DATA:  25 y.o. female with medical history significant of DM1 since age 59 yo, gastroparesis, htn, pancreatitis, asthma, anxiety comes in with acute onset of intractable vomiting since 3pm earlier. Generalized abdominal pain with vomiting. Patient being referred for admission for gastroparesis with intractable vomiting and pain. : CT ABDOMEN AND PELVIS WITHOUT CONTRAST TECHNIQUE: Multidetector CT imaging of the abdomen and pelvis was performed following the standard protocol without IV contrast. COMPARISON:  CT abdomen dated 04/21/2015 and CT abdomen dated 11/25/2014. FINDINGS: Lower chest: No acute abnormality. Hepatobiliary: Characterization of the liver is limited without intravascular contrast. The 2 dominant lesions within the right liver lobe, better demonstrated on earlier contrast enhanced CT of 04/21/2015, are grossly stable. The smaller hypodense lesion within the left liver lobe is also grossly stable. No new liver lesions identified. Patient is status post cholecystectomy. No bile duct dilatation appreciated. Pancreas: Unremarkable. No pancreatic ductal dilatation or surrounding inflammatory changes. Spleen: Normal in size without focal abnormality. Adrenals/Urinary Tract: Adrenal glands appear normal. Bladder is prominently distended. Mild bilateral hydronephrosis, right greater the left, likely related  to the bladder distension. No renal, ureteral or bladder calculi identified. Stomach/Bowel: Bowel is normal in caliber. No bowel wall thickening or evidence of bowel wall inflammation seen. Appendix appears normal. Appendix is not seen. Vascular/Lymphatic: No significant vascular findings are present. No enlarged abdominal or pelvic lymph nodes. Reproductive: Unremarkable. Other: No free fluid or abscess collections seen. No free intraperitoneal air. Musculoskeletal: No osseous abnormality. Superficial soft tissues are unremarkable. IMPRESSION: 1. Fairly severe bladder distension, extending into the mid abdomen. Mild bilateral hydronephrosis, right greater than left, presumably related to the bladder distension. No perinephric inflammation. No renal, ureteral or bladder calculi. 2. Three liver lesions appear grossly stable compared to the earlier exams, better demonstrated on the earlier contrast enhanced CT and MRI studies. 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 Per the MRI report of 11/27/2014, a recommended six-month follow-up MRI was due in May of 2017. 3. Remainder of the abdomen and pelvis CT is unremarkable. No bowel obstruction or evidence of bowel wall inflammation seen. No free fluid. Electronically Signed   By: Franki Cabot M.D.   On: 11/02/2015 19:39        Scheduled Meds: . enoxaparin (LOVENOX) injection  40 mg Subcutaneous Q24H  . escitalopram  5 mg Oral Daily  . famotidine (PEPCID) IV  20 mg Intravenous Q12H  . insulin aspart  0-15 Units Subcutaneous TID WC  . insulin glargine  24 Units Subcutaneous Q2200  . magnesium sulfate 1 - 4 g bolus IVPB  2 g Intravenous Once  . metoCLOPramide (REGLAN) injection  10 mg Intravenous Q8H  . montelukast  10 mg Oral Daily  . potassium chloride  10 mEq Intravenous Q1 Hr x 4  . potassium phosphate (monobasic)  500 mg Oral TID WC & HS   Continuous Infusions: . sodium chloride 200 mL/hr at 11/03/15 1600  .   sodium bicarbonate infusion 1/4 NS 1000 mL 100 mL/hr at 11/04/15 0525     LOS: 1 day    Time spent: 26 minutes    Lukus Binion Tanna Furry, MD [facility] Pager [phone]  If 7PM-7AM, please contact night-coverage [email or web address] Password [email or web address] 11/04/2015, 10:51 AM

## 2015-11-04 NOTE — Progress Notes (Signed)
Did not cover current CBG, pt had full liquid prior to transfer and was covered 3 u of insulin for blood sugar. SRP, RN

## 2015-11-05 DIAGNOSIS — E876 Hypokalemia: Secondary | ICD-10-CM

## 2015-11-05 LAB — POTASSIUM: Potassium: 3.5 mmol/L (ref 3.5–5.1)

## 2015-11-05 LAB — BASIC METABOLIC PANEL
Anion gap: 8 (ref 5–15)
Anion gap: 4 — ABNORMAL LOW (ref 5–15)
CHLORIDE: 113 mmol/L — AB (ref 101–111)
CO2: 20 mmol/L — ABNORMAL LOW (ref 22–32)
CO2: 24 mmol/L (ref 22–32)
CREATININE: 0.49 mg/dL (ref 0.44–1.00)
CREATININE: 0.42 mg/dL — AB (ref 0.44–1.00)
Calcium: 7.5 mg/dL — ABNORMAL LOW (ref 8.9–10.3)
Calcium: 7.4 mg/dL — ABNORMAL LOW (ref 8.9–10.3)
Chloride: 113 mmol/L — ABNORMAL HIGH (ref 101–111)
GFR calc Af Amer: >60 mL/min (ref >60)
GFR calc Af Amer: >60 mL/min (ref >60)
GLUCOSE: 195 mg/dL — AB (ref 65–99)
GLUCOSE: 147 mg/dL — AB (ref 65–99)
POTASSIUM: 2.6 mmol/L — AB (ref 3.5–5.1)
Potassium: 3 mmol/L — ABNORMAL LOW (ref 3.5–5.1)
SODIUM: 141 mmol/L (ref 135–145)
SODIUM: 141 mmol/L (ref 135–145)

## 2015-11-05 LAB — GLUCOSE, CAPILLARY
GLUCOSE-CAPILLARY: 128 mg/dL — AB (ref 65–99)
GLUCOSE-CAPILLARY: 218 mg/dL — AB (ref 65–99)
Glucose-Capillary: 51 mg/dL — ABNORMAL LOW (ref 65–99)
Glucose-Capillary: 72 mg/dL (ref 65–99)
Glucose-Capillary: 201 mg/dL — ABNORMAL HIGH (ref 65–99)
Glucose-Capillary: 170 mg/dL — ABNORMAL HIGH (ref 65–99)

## 2015-11-05 LAB — PHOSPHORUS: PHOSPHORUS: 1.8 mg/dL — AB (ref 2.5–4.6)

## 2015-11-05 LAB — CBC
HCT: 28.7 % — ABNORMAL LOW (ref 36.0–46.0)
Hemoglobin: 9.8 g/dL — ABNORMAL LOW (ref 12.0–15.0)
MCH: 30.4 pg (ref 26.0–34.0)
MCHC: 34.1 g/dL (ref 30.0–36.0)
MCV: 89.1 fL (ref 78.0–100.0)
PLATELETS: 268 10*3/uL (ref 150–400)
RBC: 3.22 MIL/uL — ABNORMAL LOW (ref 3.87–5.11)
RDW: 13.2 % (ref 11.5–15.5)
WBC: 4.9 10*3/uL (ref 4.0–10.5)

## 2015-11-05 LAB — MAGNESIUM: Magnesium: 2 mg/dL (ref 1.7–2.4)

## 2015-11-05 MED ORDER — POTASSIUM CHLORIDE 10 MEQ/100ML IV SOLN
10.0000 meq | INTRAVENOUS | Status: AC
  Administered 2015-11-05 (×5): 10 meq via INTRAVENOUS
  Filled 2015-11-05 (×3): qty 100

## 2015-11-05 MED ORDER — INSULIN ASPART 100 UNIT/ML ~~LOC~~ SOLN
0.0000 [IU] | Freq: Every day | SUBCUTANEOUS | Status: DC
  Administered 2015-11-05: 2 [IU] via SUBCUTANEOUS

## 2015-11-05 MED ORDER — INSULIN GLARGINE 100 UNIT/ML ~~LOC~~ SOLN
22.0000 [IU] | Freq: Every day | SUBCUTANEOUS | Status: DC
  Administered 2015-11-05: 22 [IU] via SUBCUTANEOUS
  Filled 2015-11-05: qty 0.22

## 2015-11-05 MED ORDER — INSULIN ASPART 100 UNIT/ML ~~LOC~~ SOLN
0.0000 [IU] | Freq: Three times a day (TID) | SUBCUTANEOUS | Status: DC
  Administered 2015-11-05 – 2015-11-06 (×2): 2 [IU] via SUBCUTANEOUS

## 2015-11-05 MED ORDER — POTASSIUM CHLORIDE CRYS ER 20 MEQ PO TBCR
40.0000 meq | EXTENDED_RELEASE_TABLET | Freq: Two times a day (BID) | ORAL | Status: DC
  Administered 2015-11-05 – 2015-11-06 (×3): 40 meq via ORAL
  Filled 2015-11-05 (×3): qty 2

## 2015-11-05 MED ORDER — POTASSIUM CHLORIDE 10 MEQ/100ML IV SOLN
INTRAVENOUS | Status: AC
  Filled 2015-11-05: qty 100

## 2015-11-05 NOTE — Progress Notes (Signed)
CRITICAL VALUE ALERT  Critical value received:  K+ 2.6  Date of notification:  11/05/2015  Time of notification:  [id number]  Critical value read back:Yes.    Nurse who received alert:  L. Vergel de Larkin Ina RN  MD notified (1st page):  Tylene Fantasia NP  Time of first page:  0600  MD notified (2nd page):  Time of second page:  Responding MD:  Lamar Blinks NP  Time MD responded:  [phone]

## 2015-11-05 NOTE — Progress Notes (Signed)
CBG 51. Refused orange juice but requested apple juice and graham crackers. Rechecked and was up to 72.   K. Hardin Negus RN

## 2015-11-05 NOTE — Progress Notes (Signed)
PROGRESS NOTE    [name]  [id number] DOB: February 01, 1990 DOA: 11/01/2015 PCP: Vicenta Aly, FNP    Brief Narrative:  25 y.o. female with medical history significant of DM1 since age 50 yo, gastroparesis, htn, pancreatitis, asthma, anxiety comes in with acute onset of intractable vomiting for one day. CT abdomen pelvis showed bladder distention and b/l mild hydronephrosis. S/p foley catheter insertion with 1300 cc of urine output. Developed DKA improved now.  Assessment & Plan:  # Acute urinary retention with severe bladder distention and mild b/l hydronephrosis: Patient presented with nausea vomiting and abdominal pain. CT scan of abdomen revealed. Exact etiology of urinary retention unknown, may be contributed by medications. Status post Foley catheter insertion with significant amount of urine output. Patient is clinically improving. -The Foley catheter was successfully removed and patient was able to void afterward. Need to follow-up outpatient for mild hydronephrosis. I expect this to improve with improvement of urinary retention.  #Uncontrolled type 1 diabetes with diabetic ketoacidosis without coma: Status post insulin drip and IV fluid treatment. DKA improved. Patient was resumed on home dose of long-acting insulin. Patient developed hypoglycemia this morning therefore I reduced the dose of Lantus to 22 unit. Also changed sliding scale to lower dose with at bedtime coverage. Education provided to the patient regarding her diabetic management and close monitoring of blood sugar level. -I will discontinue IV fluid because of trace lower extremity edema.  # Hypokalemia, hypophosphatemia: Patient had severe hypokalemia this morning. Repleted with potassium chloride IV. On potassium chloride oral and potassium phosphate. Repeat lab to monitor electrolytes. Encourage oral intake. Patient has improving oral intake today.   #  Diabetic gastroparesis [facility]): has nausea and vomiting.    -Continue Reglan IV every 8 hourly with Zofran and phenergan as needed for the management of nausea. May be able to switch to oral Reglan when patient has good oral intake. -morphine prn for pain control -continue supportive care -May benefit from outpatient follow-up with the gastroenterologist to evaluate for gastroparesis and further treatment.   #Sinus tachycardia due to pain and DKA: HR improving. EKG showed sinus tachy. Monitor electrolytes.  -on telemonitor. -d-dimer not elevated. Pro-calcitonin level unremarkable. Continue supportive care  #Intractable nausea vomiting and abdominal pain: Continue management as above.  DVT prophylaxis: Low dose Lovenox subcutaneous Code Status: Full code Family Communication: No family present at bedside Disposition Plan: Likely discharge home in 1-2 days   Consultants:   None  Procedures: None Antimicrobials: None  Subjective: The patient was seen and examined at bedside. Patient looks clinically much better today. Denied nausea, vomiting or abdominal pain. Reported trying to eat. No fever, chills, headache, dizziness, chest pain or shortness of breath. She reported trace lower extremity edema.  Objective: Vitals:   11/04/15 2236 11/05/15 0418 11/05/15 0500 11/05/15 1409  BP: 112/72 113/70  134/89  Pulse: (!) 105 96  (!) 101  Resp: 18 18  16   Temp: 99.4 F (37.4 C) 98.1 F (36.7 C)  98.2 F (36.8 C)  TempSrc: Oral Oral  Oral  SpO2: 100% 100%  100%  Weight:   59.3 kg (130 lb 12.8 oz)   Height:        Intake/Output Summary (Last 24 hours) at 11/05/15 1500 Last data filed at 11/05/15 [id number]  Gross per 24 hour  Intake             2615 ml  Output                0  ml  Net             2615 ml   Filed Weights   11/03/15 0330 11/04/15 1752 11/05/15 0500  Weight: 54.4 kg (119 lb 14.9 oz) 58.4 kg (128 lb 12 oz) 59.3 kg (130 lb 12.8 oz)    Examination:  General exam: Not on distress. Looking  comfortable today. Respiratory system:  Clear to auscultation bilateral, no wheezing or crackle. Cardiovascular system: Sinus rhythm, S1-S2 normal. No murmur appreciated.  Gastrointestinal system: Abdomen soft, nontender, nondistended. Bowel sound positive.  Central nervous system: Alert, awake, oriented. No focal deficit.  Extremities: Symmetric 5 x 5 power. Skin: No rash or ulcer Psychiatry: Judgement and insight appear normal. Mood & affect appropriate.     Data Reviewed: I have personally reviewed following labs and imaging studies  CBC:  Recent Labs Lab 11/01/15 1658 11/02/15 0536 11/03/15 0323 11/05/15 0344  WBC 12.2* 13.5* 11.4* 4.9  HGB 15.1* 12.7 13.7 9.8*  HCT 45.1 37.5 40.2 28.7*  MCV 90.6 88.9 92.0 89.1  PLT 451* 382 397 [id number]   Basic Metabolic Panel:  Recent Labs Lab 11/01/15 1708  11/03/15 0728  11/04/15 0345 11/04/15 0723 11/04/15 1515 11/04/15 2316 11/05/15 0344  NA  --   < > 132*  < > 138 137 140 141 141  K  --   < > 4.8  < > 3.0* 3.1* 3.2* 3.0* 2.6*  CL  --   < > 110  < > 108 106 109 113* 113*  CO2  --   < > <7*  < > 22 23 26  20* 24  GLUCOSE  --   < > 278*  < > 152* 165* 162* 195* 147*  BUN  --   < > <5*  < > <5* <5* <5* <5* <5*  CREATININE  --   < > 0.76  < > 0.47 0.48 0.52 0.49 0.42*  CALCIUM  --   < > 8.3*  < > 7.9* 7.9* 7.9* 7.5* 7.4*  MG 2.1  --  1.9  --  1.8  --   --   --  2.0  PHOS  --   --  1.5*  --  1.4*  --   --   --  1.8*  < > = values in this interval not displayed. GFR: Estimated Creatinine Clearance: 92 mL/min (by C-G formula based on SCr of 0.42 mg/dL (L)). Liver Function Tests:  Recent Labs Lab 11/01/15 1658  AST 42*  ALT 19  ALKPHOS 119  BILITOT 0.7  PROT 9.8*  ALBUMIN 5.2*    Recent Labs Lab 11/01/15 1658  LIPASE 12   No results for input(s): AMMONIA in the last 168 hours. Coagulation Profile: No results for input(s): INR, PROTIME in the last 168 hours. Cardiac Enzymes:  Recent Labs Lab 11/03/15 0323  TROPONINI <0.03   BNP (last 3 results) No  results for input(s): PROBNP in the last 8760 hours. HbA1C: No results for input(s): HGBA1C in the last 72 hours. CBG:  Recent Labs Lab 11/04/15 2348 11/05/15 0413 11/05/15 0750 11/05/15 0825 11/05/15 1230  GLUCAP 194* 128* 51* 72 201*   Lipid Profile: No results for input(s): CHOL, HDL, LDLCALC, TRIG, CHOLHDL, LDLDIRECT in the last 72 hours. Thyroid Function Tests:  Recent Labs  11/03/15 0323  TSH 1.316   Anemia Panel: No results for input(s): VITAMINB12, FOLATE, FERRITIN, TIBC, IRON, RETICCTPCT in the last 72 hours. Sepsis Labs:  Recent Labs Lab 11/02/15 1020 11/02/15 1315 11/02/15  2038  PROCALCITON  --   --  0.14  LATICACIDVEN 2.9* 1.3  --     Recent Results (from the past 240 hour(s))  MRSA PCR Screening     Status: None   Collection Time: 11/03/15  3:06 AM  Result Value Ref Range Status   MRSA by PCR NEGATIVE NEGATIVE Final    Comment:        The GeneXpert MRSA Assay (FDA approved for NASAL specimens only), is one component of a comprehensive MRSA colonization surveillance program. It is not intended to diagnose MRSA infection nor to guide or monitor treatment for MRSA infections.          Radiology Studies: No results found.      Scheduled Meds: . potassium chloride      . enoxaparin (LOVENOX) injection  40 mg Subcutaneous Q24H  . escitalopram  5 mg Oral Daily  . famotidine (PEPCID) IV  20 mg Intravenous Q12H  . insulin aspart  0-5 Units Subcutaneous QHS  . insulin aspart  0-9 Units Subcutaneous TID WC  . insulin glargine  22 Units Subcutaneous Q2200  . metoCLOPramide (REGLAN) injection  10 mg Intravenous Q8H  . montelukast  10 mg Oral Daily  . potassium chloride  10 mEq Intravenous Q1 Hr x 5  . potassium chloride  40 mEq Oral BID  . potassium phosphate (monobasic)  500 mg Oral TID WC & HS   Continuous Infusions: . sodium chloride 100 mL/hr at 11/05/15 0459     LOS: 2 days    Time spent: 25 minutes    Asah Lamay Tanna Furry, MD [facility] Pager [phone]  If 7PM-7AM, please contact night-coverage [email or web address] Password [email or web address] 11/05/2015, 3:00 PM

## 2015-11-05 NOTE — Progress Notes (Addendum)
Inpatient Diabetes Program Recommendations  AACE/ADA: New Consensus Statement on Inpatient Glycemic Control (2015)  Target Ranges:  Prepandial:   less than 140 mg/dL      Peak postprandial:   less than 180 mg/dL (1-2 hours)      Critically ill patients:  140 - 180 mg/dL   Lab Results  Component Value Date   GLUCAP 51 (L) 11/05/2015   HGBA1C 9.2 (H) 05/25/2015    Review of Glycemic Control  Diabetes history: DM 2 Outpatient Diabetes medications: Basaglar 24 units, Novolog 0-20 units 4x/daily Current orders for Inpatient glycemic control: Lantus 24 units, Novolog Moderate Q4hrs  Inpatient Diabetes Program Recommendations:   Patient had hypoglycemia in the 50's this am. Patient has diet ordered and is eating. Consider changing Novolog Correction to Novolog Sensitive TID + HS scale. May also need to decrease basal slightly to Lantus 20-22 units.  Thanks,  Tama Headings RN, MSN, [facility] Inpatient Diabetes Coordinator Team Pager [phone] (8a-5p)

## 2015-11-06 LAB — BASIC METABOLIC PANEL
ANION GAP: 5 (ref 5–15)
BUN: <5 mg/dL — ABNORMAL LOW (ref 6–20)
CO2: 26 mmol/L (ref 22–32)
Calcium: 8.3 mg/dL — ABNORMAL LOW (ref 8.9–10.3)
Chloride: 108 mmol/L (ref 101–111)
Creatinine, Ser: 0.37 mg/dL — ABNORMAL LOW (ref 0.44–1.00)
GFR calc Af Amer: >60 mL/min (ref >60)
GLUCOSE: 191 mg/dL — AB (ref 65–99)
POTASSIUM: 3.6 mmol/L (ref 3.5–5.1)
Sodium: 139 mmol/L (ref 135–145)

## 2015-11-06 LAB — GLUCOSE, CAPILLARY: Glucose-Capillary: 153 mg/dL — ABNORMAL HIGH (ref 65–99)

## 2015-11-06 LAB — PHOSPHORUS: Phosphorus: 2.3 mg/dL — ABNORMAL LOW (ref 2.5–4.6)

## 2015-11-06 MED ORDER — PROMETHAZINE HCL 25 MG PO TABS: 25.0000 mg | ORAL_TABLET | Freq: Four times a day (QID) | ORAL | 0 refills | Status: DC | PRN

## 2015-11-06 MED ORDER — METOCLOPRAMIDE HCL 10 MG PO TABS: 10.0000 mg | ORAL_TABLET | Freq: Three times a day (TID) | ORAL | 0 refills | Status: DC

## 2015-11-06 MED ORDER — EPINEPHRINE 0.3 MG/0.3ML IJ SOAJ: 0.3000 mg | Freq: Once | INTRAMUSCULAR | 0 refills | Status: AC

## 2015-11-06 NOTE — Discharge Summary (Addendum)
Physician Discharge Summary  [name] [id number] DOB: 1990-08-24 DOA: 11/01/2015  PCP: Vicenta Aly, FNP  Admit date: 11/01/2015 Discharge date: 11/06/2015  Admitted From: home  Disposition:  home  Recommendations for Outpatient Follow-up:  1. Follow up with PCP in 1-2 weeks.  Needs repeat A1c if not up to date. 2. Endocrinology follow up when insurance allows  Home Health:  none  Equipment/Devices:  None  Discharge Condition:  Stable, improved CODE STATUS:  Full code  Diet recommendation:  Diabetic   Brief/Interim Summary:  25 y.o.femalewith medical history significant of DM1 since age 78 yo, gastroparesis, htn, pancreatitis, asthma, anxiety comes in with acute onset of intractable vomiting for one day. CT abdomen pelvis showed bladder distention and b/l mild hydronephrosis. S/p foley catheter insertion with 1300 cc of urine output. Developed DKA improved now.  Discharge Diagnoses:  Principal Problem:   Diabetic gastroparesis ([name]) Active Problems:   Uncontrolled type 1 diabetes mellitus ([name])   Asthma   DKA (diabetic ketoacidoses) (HCC)   Chronic diastolic heart failure (HCC)   Dehydration   Intractable nausea and vomiting   Acute urinary retention  Acute urinary retention with severe bladder distention and mild b/l hydronephrosis: Patient presented with nausea vomiting and abdominal pain. CT scan of abdomen revealed. Exact etiology of urinary retention unknown, may be contributed by medications or neurogenic bladder. Status post Foley catheter insertion with significant amount of urine output.  Foley catheter was successfully removed and patient was able to void afterward. Need to follow-up outpatient for mild hydronephrosis. I expect this to improve with improvement of urinary retention.  Uncontrolled type 1 diabetes with diabetic ketoacidosis without coma:  Last A1c 9.2 from 05/2015.  Status post insulin drip and IV fluid treatment. DKA improved. Patient  was resumed on home dose of long-acting insulin. Patient developed hypoglycemia this morning so reduced the dose of Lantus to 22 unit but advised her to resume her previous insulin regimen as prescribed by her endocrinologist at discharge.  Education provided to the patient regarding her diabetic management and close monitoring of blood sugar level.  Hypokalemia, hypophosphatemia improved with oral and IV supplementation.  Anticipate her hypophosphatemia will continue to improve now that her DKA has resolved and she is eating again.    Diabetic gastroparesis [facility]): has nausea, vomiting, and abdominal pain.  Improved with reglan and antiemetics.  Advised follow up with endocrinology.     Sinus tachycardia due to pain and DKA.  Resolved with DKA treatment and IVF.  D-dimer not elevated. Pro-calcitonin level unremarkable.    Discharge Instructions  Discharge Instructions    Call MD for:  difficulty breathing, headache or visual disturbances    Complete by:  As directed    Call MD for:  extreme fatigue    Complete by:  As directed    Call MD for:  hives    Complete by:  As directed    Call MD for:  persistant dizziness or light-headedness    Complete by:  As directed    Call MD for:  persistant nausea and vomiting    Complete by:  As directed    Call MD for:  severe uncontrolled pain    Complete by:  As directed    Call MD for:  temperature >100.4    Complete by:  As directed    Diet Carb Modified    Complete by:  As directed    Increase activity slowly    Complete by:  As directed  Medication List    TAKE these medications   albuterol 108 (90 Base) MCG/ACT inhaler Commonly known as:  PROVENTIL HFA;VENTOLIN HFA Inhale 2 puffs into the lungs every 6 (six) hours as needed for shortness of breath.   albuterol (2.5 MG/3ML) 0.083% nebulizer solution Commonly known as:  PROVENTIL Take 2.5 mg by nebulization every 6 (six) hours as needed for shortness of breath.   BASAGLAR  KWIKPEN Rawlings Inject 24 Units into the skin at bedtime. What changed:  Another medication with the same name was removed. Continue taking this medication, and follow the directions you see here.   budesonide-formoterol 160-4.5 MCG/ACT inhaler Commonly known as:  SYMBICORT Inhale 2 puffs into the lungs daily.   EPINEPHrine 0.3 mg/0.3 mL Soaj injection Commonly known as:  EPIPEN Inject 0.3 mLs (0.3 mg total) into the muscle once. As needed for allergic reaction. Never had to use before   insulin aspart 100 UNIT/ML FlexPen Commonly known as:  NOVOLOG FLEXPEN INJECT 0-20 UNITS INTO THE SKIN AS DIRECTED. SSI - CARB SCALE, 0-20 UNITS What changed:  how much to take  how to take this  when to take this  reasons to take this  additional instructions   LEXAPRO 10 MG tablet Generic drug:  escitalopram Take 5 mg by mouth daily.   metoCLOPramide 10 MG tablet Commonly known as:  REGLAN Take 1 tablet (10 mg total) by mouth 3 (three) times daily before meals.   montelukast 10 MG tablet Commonly known as:  SINGULAIR Take 10 mg by mouth daily.   omeprazole 40 MG capsule Commonly known as:  PRILOSEC Take 1 capsule (40 mg total) by mouth daily.   ondansetron 4 MG tablet Commonly known as:  ZOFRAN Take 1 tablet (4 mg total) by mouth every 8 (eight) hours as needed for nausea or vomiting.   promethazine 25 MG suppository Commonly known as:  PHENERGAN Place 1 suppository (25 mg total) rectally every 6 (six) hours as needed for nausea or vomiting. What changed:  Another medication with the same name was changed. Make sure you understand how and when to take each.   promethazine 25 MG tablet Commonly known as:  PHENERGAN Take 1 tablet (25 mg total) by mouth every 6 (six) hours as needed for refractory nausea / vomiting. What changed:  reasons to take this      Follow-up Information    Braun,TERESA, FNP. Schedule an appointment as soon as possible for a visit in 1 week(s).    Specialty:  Nurse Practitioner Contact information: [place] [place] [address] [phone]        Braun, Stefanie E, MD Follow up in 2 week(s).   Specialty:  Endocrinology Why:  schedule appointment once insurance verified Contact information: [id number] [place]  [id number] [phone]          Allergies  Allergen Reactions  . Peanut-Containing Drug Products Swelling    Swelling of mouth, lips  . Strawberry Extract Swelling    Swelling of mouth, lips  . Ultram [Tramadol] Itching    Consultations: none    Procedures/Studies: Ct Abdomen Pelvis Wo Contrast  Result Date: 11/02/2015 CLINICAL DATA:  25 y.o. female with medical history significant of DM1 since age 23 yo, gastroparesis, htn, pancreatitis, asthma, anxiety comes in with acute onset of intractable vomiting since 3pm earlier. Generalized abdominal pain with vomiting. Patient being referred for admission for gastroparesis with intractable vomiting and pain. : CT ABDOMEN AND PELVIS WITHOUT CONTRAST TECHNIQUE:  Multidetector CT imaging of the abdomen and pelvis was performed following the standard protocol without IV contrast. COMPARISON:  CT abdomen dated 04/21/2015 and CT abdomen dated 11/25/2014. FINDINGS: Lower chest: No acute abnormality. Hepatobiliary: Characterization of the liver is limited without intravascular contrast. The 2 dominant lesions within the right liver lobe, better demonstrated on earlier contrast enhanced CT of 04/21/2015, are grossly stable. The smaller hypodense lesion within the left liver lobe is also grossly stable. No new liver lesions identified. Patient is status post cholecystectomy. No bile duct dilatation appreciated. Pancreas: Unremarkable. No pancreatic ductal dilatation or surrounding inflammatory changes. Spleen: Normal in size without focal abnormality. Adrenals/Urinary Tract: Adrenal glands appear normal. Bladder is prominently distended.  Mild bilateral hydronephrosis, right greater the left, likely related to the bladder distension. No renal, ureteral or bladder calculi identified. Stomach/Bowel: Bowel is normal in caliber. No bowel wall thickening or evidence of bowel wall inflammation seen. Appendix appears normal. Appendix is not seen. Vascular/Lymphatic: No significant vascular findings are present. No enlarged abdominal or pelvic lymph nodes. Reproductive: Unremarkable. Other: No free fluid or abscess collections seen. No free intraperitoneal air. Musculoskeletal: No osseous abnormality. Superficial soft tissues are unremarkable. IMPRESSION: 1. Fairly severe bladder distension, extending into the mid abdomen. Mild bilateral hydronephrosis, right greater than left, presumably related to the bladder distension. No perinephric inflammation. No renal, ureteral or bladder calculi. 2. Three liver lesions appear grossly stable compared to the earlier exams, better demonstrated on the earlier contrast enhanced CT and MRI studies. 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 Per the MRI report of 11/27/2014, a recommended six-month follow-up MRI was due in May of 2017. 3. Remainder of the abdomen and pelvis CT is unremarkable. No bowel obstruction or evidence of bowel wall inflammation seen. No free fluid. Electronically Signed   By: Franki Cabot M.D.   On: 11/02/2015 19:39   Dg Abd 2 Views  Result Date: 11/01/2015 CLINICAL DATA:  Vomiting. Nausea and diffuse abdominal pain for 1 day. EXAM: ABDOMEN - 2 VIEW COMPARISON:  Radiographs 07/05/2015.  CT 04/21/2015 FINDINGS: No dilated bowel loops to suggest obstruction. Small volume of stool in the descending colon. There is otherwise generalized paucity of bowel gas. This is unchanged from scout tomogram from CT which demonstrated normal bowel. No free air. No radiopaque calculi. Calcifications in the pelvis are phleboliths. The lung bases are clear. There is no osseous  abnormality. IMPRESSION: Small stool burden.  No bowel dilatation to suggest obstruction. Electronically Signed   By: Jeb Levering M.D.   On: 11/01/2015 22:18    Subjective: Feeling well and asking to go home.  Ate breakfast without difficulty this morning.    Discharge Exam: Vitals:   11/05/15 2202 11/06/15 0545  BP: (!) 147/100 115/86  Pulse: 100 92  Resp: 18 18  Temp: 98.7 F (37.1 C) 98.4 F (36.9 C)   Vitals:   11/05/15 1409 11/05/15 2202 11/06/15 0545 11/06/15 0552  BP: 134/89 (!) 147/100 115/86   Pulse: (!) 101 100 92   Resp: 16 18 18    Temp: 98.2 F (36.8 C) 98.7 F (37.1 C) 98.4 F (36.9 C)   TempSrc: Oral Oral Oral   SpO2: 100% 100% 100%   Weight:    61 kg (134 lb 8 oz)  Height:        General: Pt is alert, awake, not in acute distress Cardiovascular: RRR, S1/S2 +, no rubs, no gallops Respiratory: CTA bilaterally, no wheezing, no rhonchi Abdominal: Soft,  NT, ND, bowel sounds + Extremities: no edema, no cyanosis    The results of significant diagnostics from this hospitalization (including imaging, microbiology, ancillary and laboratory) are listed below for reference.     Microbiology: Recent Results (from the past 240 hour(s))  MRSA PCR Screening     Status: None   Collection Time: 11/03/15  3:06 AM  Result Value Ref Range Status   MRSA by PCR NEGATIVE NEGATIVE Final    Comment:        The GeneXpert MRSA Assay (FDA approved for NASAL specimens only), is one component of a comprehensive MRSA colonization surveillance program. It is not intended to diagnose MRSA infection nor to guide or monitor treatment for MRSA infections.      Labs: BNP (last 3 results)  Recent Labs  04/11/15 0325 07/11/15 0353  BNP 43.5 6.9   Basic Metabolic Panel:  Recent Labs Lab 11/01/15 1708  11/03/15 0728  11/04/15 0345 11/04/15 0723 11/04/15 1515 11/04/15 2316 11/05/15 0344 11/05/15 1813 11/06/15 0457  NA  --   < > 132*  < > 138 137 140 141 141   --  139  K  --   < > 4.8  < > 3.0* 3.1* 3.2* 3.0* 2.6* 3.5 3.6  CL  --   < > 110  < > 108 106 109 113* 113*  --  108  CO2  --   < > <7*  < > 22 23 26  20* 24  --  26  GLUCOSE  --   < > 278*  < > 152* 165* 162* 195* 147*  --  191*  BUN  --   < > <5*  < > <5* <5* <5* <5* <5*  --  <5*  CREATININE  --   < > 0.76  < > 0.47 0.48 0.52 0.49 0.42*  --  0.37*  CALCIUM  --   < > 8.3*  < > 7.9* 7.9* 7.9* 7.5* 7.4*  --  8.3*  MG 2.1  --  1.9  --  1.8  --   --   --  2.0  --   --   PHOS  --   --  1.5*  --  1.4*  --   --   --  1.8*  --  2.3*  < > = values in this interval not displayed. Liver Function Tests:  Recent Labs Lab 11/01/15 1658  AST 42*  ALT 19  ALKPHOS 119  BILITOT 0.7  PROT 9.8*  ALBUMIN 5.2*    Recent Labs Lab 11/01/15 1658  LIPASE 12   No results for input(s): AMMONIA in the last 168 hours. CBC:  Recent Labs Lab 11/01/15 1658 11/02/15 0536 11/03/15 0323 11/05/15 0344  WBC 12.2* 13.5* 11.4* 4.9  HGB 15.1* 12.7 13.7 9.8*  HCT 45.1 37.5 40.2 28.7*  MCV 90.6 88.9 92.0 89.1  PLT 451* 382 397 268   Cardiac Enzymes:  Recent Labs Lab 11/03/15 0323  TROPONINI <0.03   BNP: Invalid input(s): POCBNP CBG:  Recent Labs Lab 11/05/15 0825 11/05/15 1230 11/05/15 1716 11/05/15 2200 11/06/15 0724  GLUCAP 72 201* 170* 218* 153*   D-Dimer No results for input(s): DDIMER in the last 72 hours. Hgb A1c No results for input(s): HGBA1C in the last 72 hours. Lipid Profile No results for input(s): CHOL, HDL, LDLCALC, TRIG, CHOLHDL, LDLDIRECT in the last 72 hours. Thyroid function studies No results for input(s): TSH, T4TOTAL, T3FREE, THYROIDAB in the last 72 hours.  Invalid  input(s): FREET3 Anemia work up No results for input(s): VITAMINB12, FOLATE, FERRITIN, TIBC, IRON, RETICCTPCT in the last 72 hours. Urinalysis    Component Value Date/Time   COLORURINE YELLOW 11/03/2015 0147   APPEARANCEUR CLEAR 11/03/2015 0147   LABSPEC 1.022 11/03/2015 0147   PHURINE 5.5  11/03/2015 0147   GLUCOSEU >1000 (A) 11/03/2015 0147   GLUCOSEU >=1000 11/07/2012 1205   HGBUR SMALL (A) 11/03/2015 0147   BILIRUBINUR MODERATE (A) 11/03/2015 0147   KETONESUR >80 (A) 11/03/2015 0147   PROTEINUR 30 (A) 11/03/2015 0147   UROBILINOGEN 0.2 11/24/2014 1045   NITRITE NEGATIVE 11/03/2015 0147   LEUKOCYTESUR NEGATIVE 11/03/2015 0147   Sepsis Labs Invalid input(s): PROCALCITONIN,  WBC,  LACTICIDVEN   Time coordinating discharge: Over 30 minutes  SIGNED:   Janece Canterbury, MD  [facility] 11/06/2015, 4:40 PM Pager   If 7PM-7AM, please contact night-coverage [email or web address] Password [email or web address]

## 2015-11-09 ENCOUNTER — Inpatient Hospital Stay (HOSPITAL_COMMUNITY): Payer: Medicaid Other

## 2015-11-09 ENCOUNTER — Inpatient Hospital Stay (HOSPITAL_COMMUNITY)
Admission: EM | Admit: 2015-11-09 | Discharge: 2015-11-12 | DRG: 637 | Disposition: A | Discharge status: Home / Self Care | Payer: Medicaid Other | Attending: Internal Medicine | Admitting: Internal Medicine

## 2015-11-09 ENCOUNTER — Encounter (HOSPITAL_COMMUNITY): Payer: Self-pay | Admitting: *Deleted

## 2015-11-09 DIAGNOSIS — K3184 Gastroparesis: Secondary | ICD-10-CM | POA: Diagnosis present

## 2015-11-09 DIAGNOSIS — R112 Nausea with vomiting, unspecified: Secondary | ICD-10-CM

## 2015-11-09 DIAGNOSIS — I5032 Chronic diastolic (congestive) heart failure: Secondary | ICD-10-CM | POA: Diagnosis present

## 2015-11-09 DIAGNOSIS — E876 Hypokalemia: Secondary | ICD-10-CM | POA: Diagnosis present

## 2015-11-09 DIAGNOSIS — J45909 Unspecified asthma, uncomplicated: Secondary | ICD-10-CM | POA: Diagnosis present

## 2015-11-09 DIAGNOSIS — J4520 Mild intermittent asthma, uncomplicated: Secondary | ICD-10-CM

## 2015-11-09 DIAGNOSIS — Z9049 Acquired absence of other specified parts of digestive tract: Secondary | ICD-10-CM | POA: Diagnosis not present

## 2015-11-09 DIAGNOSIS — E1043 Type 1 diabetes mellitus with diabetic autonomic (poly)neuropathy: Secondary | ICD-10-CM | POA: Diagnosis present

## 2015-11-09 DIAGNOSIS — R8271 Bacteriuria: Secondary | ICD-10-CM | POA: Diagnosis present

## 2015-11-09 DIAGNOSIS — E1010 Type 1 diabetes mellitus with ketoacidosis without coma: Principal | ICD-10-CM | POA: Diagnosis present

## 2015-11-09 DIAGNOSIS — E1110 Type 2 diabetes mellitus with ketoacidosis without coma: Secondary | ICD-10-CM | POA: Diagnosis present

## 2015-11-09 DIAGNOSIS — E43 Unspecified severe protein-calorie malnutrition: Secondary | ICD-10-CM | POA: Diagnosis present

## 2015-11-09 DIAGNOSIS — Z681 Body mass index (BMI) 19 or less, adult: Secondary | ICD-10-CM | POA: Diagnosis not present

## 2015-11-09 DIAGNOSIS — R197 Diarrhea, unspecified: Secondary | ICD-10-CM | POA: Diagnosis present

## 2015-11-09 DIAGNOSIS — E860 Dehydration: Secondary | ICD-10-CM | POA: Diagnosis present

## 2015-11-09 DIAGNOSIS — R339 Retention of urine, unspecified: Secondary | ICD-10-CM | POA: Diagnosis present

## 2015-11-09 DIAGNOSIS — Z8249 Family history of ischemic heart disease and other diseases of the circulatory system: Secondary | ICD-10-CM | POA: Diagnosis not present

## 2015-11-09 DIAGNOSIS — Z7951 Long term (current) use of inhaled steroids: Secondary | ICD-10-CM

## 2015-11-09 DIAGNOSIS — E0810 Diabetes mellitus due to underlying condition with ketoacidosis without coma: Secondary | ICD-10-CM

## 2015-11-09 DIAGNOSIS — F1290 Cannabis use, unspecified, uncomplicated: Secondary | ICD-10-CM | POA: Diagnosis present

## 2015-11-09 DIAGNOSIS — Z794 Long term (current) use of insulin: Secondary | ICD-10-CM

## 2015-11-09 DIAGNOSIS — I110 Hypertensive heart disease with heart failure: Secondary | ICD-10-CM | POA: Diagnosis present

## 2015-11-09 DIAGNOSIS — C799 Secondary malignant neoplasm of unspecified site: Secondary | ICD-10-CM

## 2015-11-09 DIAGNOSIS — E1143 Type 2 diabetes mellitus with diabetic autonomic (poly)neuropathy: Secondary | ICD-10-CM | POA: Diagnosis present

## 2015-11-09 DIAGNOSIS — Z833 Family history of diabetes mellitus: Secondary | ICD-10-CM

## 2015-11-09 LAB — CBC WITH DIFFERENTIAL/PLATELET
BASOS ABS: 0 10*3/uL (ref 0.0–0.1)
BASOS PCT: 0 %
EOS ABS: 0.1 10*3/uL (ref 0.0–0.7)
EOS PCT: 1 %
HCT: 42 % (ref 36.0–46.0)
Hemoglobin: 14.1 g/dL (ref 12.0–15.0)
LYMPHS PCT: 10 %
Lymphs Abs: 1 10*3/uL (ref 0.7–4.0)
MCH: 30.6 pg (ref 26.0–34.0)
MCHC: 33.6 g/dL (ref 30.0–36.0)
MCV: 91.1 fL (ref 78.0–100.0)
MONO ABS: 0.2 10*3/uL (ref 0.1–1.0)
Monocytes Relative: 2 %
Neutro Abs: 8.2 10*3/uL — ABNORMAL HIGH (ref 1.7–7.7)
Neutrophils Relative %: 87 %
PLATELETS: 512 10*3/uL — AB (ref 150–400)
RBC: 4.61 MIL/uL (ref 3.87–5.11)
RDW: 13.8 % (ref 11.5–15.5)
WBC: 9.4 10*3/uL (ref 4.0–10.5)

## 2015-11-09 LAB — GLUCOSE, CAPILLARY
GLUCOSE-CAPILLARY: 142 mg/dL — AB (ref 65–99)
GLUCOSE-CAPILLARY: 161 mg/dL — AB (ref 65–99)
Glucose-Capillary: 206 mg/dL — ABNORMAL HIGH (ref 65–99)
Glucose-Capillary: 123 mg/dL — ABNORMAL HIGH (ref 65–99)

## 2015-11-09 LAB — I-STAT BETA HCG BLOOD, ED (MC, WL, AP ONLY)

## 2015-11-09 LAB — URINALYSIS, ROUTINE W REFLEX MICROSCOPIC
Bilirubin Urine: NEGATIVE
Glucose, UA: >1000 mg/dL — AB
Hgb urine dipstick: NEGATIVE
Ketones, ur: >80 mg/dL — AB
Leukocytes, UA: NEGATIVE
NITRITE: NEGATIVE
PH: 6 (ref 5.0–8.0)
Protein, ur: NEGATIVE mg/dL
SPECIFIC GRAVITY, URINE: 1.026 (ref 1.005–1.030)

## 2015-11-09 LAB — BASIC METABOLIC PANEL
Anion gap: 13 (ref 5–15)
BUN: 13 mg/dL (ref 6–20)
CALCIUM: 8.7 mg/dL — AB (ref 8.9–10.3)
CHLORIDE: 113 mmol/L — AB (ref 101–111)
CO2: 13 mmol/L — ABNORMAL LOW (ref 22–32)
CREATININE: 0.63 mg/dL (ref 0.44–1.00)
GFR calc non Af Amer: >60 mL/min (ref >60)
Glucose, Bld: 183 mg/dL — ABNORMAL HIGH (ref 65–99)
Potassium: 4.2 mmol/L (ref 3.5–5.1)
SODIUM: 139 mmol/L (ref 135–145)

## 2015-11-09 LAB — RAPID URINE DRUG SCREEN, HOSP PERFORMED
AMPHETAMINES: NOT DETECTED
BARBITURATES: NOT DETECTED
Benzodiazepines: NOT DETECTED
Cocaine: NOT DETECTED
OPIATES: NOT DETECTED
TETRAHYDROCANNABINOL: POSITIVE — AB

## 2015-11-09 LAB — URINE MICROSCOPIC-ADD ON

## 2015-11-09 LAB — BLOOD GAS, ARTERIAL
ACID-BASE DEFICIT: 12.8 mmol/L — AB (ref 0.0–2.0)
BICARBONATE: 12.4 mmol/L — AB (ref 20.0–28.0)
FIO2: 0.21
O2 SAT: 96.3 %
PATIENT TEMPERATURE: 98.6
pCO2 arterial: 27.1 mmHg — ABNORMAL LOW (ref 32.0–48.0)
pH, Arterial: 7.281 — ABNORMAL LOW (ref 7.350–7.450)
pO2, Arterial: 94.5 mmHg (ref 83.0–108.0)

## 2015-11-09 LAB — COMPREHENSIVE METABOLIC PANEL
ALT: 16 U/L (ref 14–54)
AST: 27 U/L (ref 15–41)
Albumin: 5.1 g/dL — ABNORMAL HIGH (ref 3.5–5.0)
Alkaline Phosphatase: 129 U/L — ABNORMAL HIGH (ref 38–126)
Anion gap: 19 — ABNORMAL HIGH (ref 5–15)
BUN: 19 mg/dL (ref 6–20)
CHLORIDE: 103 mmol/L (ref 101–111)
CO2: 16 mmol/L — AB (ref 22–32)
Calcium: 10 mg/dL (ref 8.9–10.3)
Creatinine, Ser: 0.78 mg/dL (ref 0.44–1.00)
GFR calc Af Amer: >60 mL/min (ref >60)
Glucose, Bld: 284 mg/dL — ABNORMAL HIGH (ref 65–99)
POTASSIUM: 4.4 mmol/L (ref 3.5–5.1)
SODIUM: 138 mmol/L (ref 135–145)
Total Bilirubin: 1.5 mg/dL — ABNORMAL HIGH (ref 0.3–1.2)
Total Protein: 9.3 g/dL — ABNORMAL HIGH (ref 6.5–8.1)

## 2015-11-09 LAB — I-STAT CG4 LACTIC ACID, ED: Lactic Acid, Venous: 2.26 mmol/L (ref 0.5–1.9)

## 2015-11-09 LAB — MAGNESIUM: Magnesium: 1.7 mg/dL (ref 1.7–2.4)

## 2015-11-09 LAB — I-STAT CHEM 8, ED
BUN: 21 mg/dL — ABNORMAL HIGH (ref 6–20)
CALCIUM ION: 1.13 mmol/L — AB (ref 1.15–1.40)
Chloride: 106 mmol/L (ref 101–111)
Creatinine, Ser: 0.4 mg/dL — ABNORMAL LOW (ref 0.44–1.00)
GLUCOSE: 287 mg/dL — AB (ref 65–99)
HCT: 46 % (ref 36.0–46.0)
HEMOGLOBIN: 15.6 g/dL — AB (ref 12.0–15.0)
Potassium: 4.5 mmol/L (ref 3.5–5.1)
Sodium: 137 mmol/L (ref 135–145)
TCO2: 17 mmol/L (ref 0–100)

## 2015-11-09 LAB — CBG MONITORING, ED
GLUCOSE-CAPILLARY: 330 mg/dL — AB (ref 65–99)
Glucose-Capillary: 371 mg/dL — ABNORMAL HIGH (ref 65–99)
Glucose-Capillary: 222 mg/dL — ABNORMAL HIGH (ref 65–99)

## 2015-11-09 LAB — BETA-HYDROXYBUTYRIC ACID: Beta-Hydroxybutyric Acid: 4.74 mmol/L — ABNORMAL HIGH (ref 0.05–0.27)

## 2015-11-09 LAB — PHOSPHORUS: Phosphorus: 2.2 mg/dL — ABNORMAL LOW (ref 2.5–4.6)

## 2015-11-09 LAB — TROPONIN I

## 2015-11-09 MED ORDER — ORAL CARE MOUTH RINSE
15.0000 mL | Freq: Two times a day (BID) | OROMUCOSAL | Status: DC
Start: 2015-11-09 — End: 2015-11-12
  Administered 2015-11-09 – 2015-11-12 (×3): 15 mL via OROMUCOSAL

## 2015-11-09 MED ORDER — ACETAMINOPHEN 325 MG PO TABS: 650.0000 mg | ORAL_TABLET | Freq: Four times a day (QID) | ORAL | Status: DC | PRN

## 2015-11-09 MED ORDER — POTASSIUM CHLORIDE 10 MEQ/100ML IV SOLN
10.0000 meq | INTRAVENOUS | Status: AC
  Administered 2015-11-09 (×2): 10 meq via INTRAVENOUS
  Filled 2015-11-09 (×2): qty 100

## 2015-11-09 MED ORDER — SODIUM CHLORIDE 0.9 % IV BOLUS (SEPSIS)
1000.0000 mL | Freq: Once | INTRAVENOUS | Status: AC
  Administered 2015-11-09: 1000 mL via INTRAVENOUS

## 2015-11-09 MED ORDER — SODIUM CHLORIDE 0.9 % IV SOLN: INTRAVENOUS | Status: DC

## 2015-11-09 MED ORDER — DEXTROSE 50 % IV SOLN: 25.0000 mL | INTRAVENOUS | Status: DC | PRN

## 2015-11-09 MED ORDER — KETOROLAC TROMETHAMINE 15 MG/ML IJ SOLN
15.0000 mg | Freq: Four times a day (QID) | INTRAMUSCULAR | Status: DC | PRN
  Administered 2015-11-09 – 2015-11-10 (×3): 15 mg via INTRAVENOUS
  Filled 2015-11-09 (×3): qty 1

## 2015-11-09 MED ORDER — PROMETHAZINE HCL 25 MG/ML IJ SOLN
25.0000 mg | Freq: Once | INTRAMUSCULAR | Status: AC
  Administered 2015-11-09: 25 mg via INTRAVENOUS
  Filled 2015-11-09: qty 1

## 2015-11-09 MED ORDER — DEXTROSE-NACL 5-0.45 % IV SOLN: INTRAVENOUS | Status: DC

## 2015-11-09 MED ORDER — ALBUTEROL SULFATE (2.5 MG/3ML) 0.083% IN NEBU
2.5000 mg | INHALATION_SOLUTION | Freq: Four times a day (QID) | RESPIRATORY_TRACT | Status: DC | PRN
Start: 2015-11-09 — End: 2015-11-12

## 2015-11-09 MED ORDER — HALOPERIDOL LACTATE 5 MG/ML IJ SOLN
2.0000 mg | Freq: Once | INTRAMUSCULAR | Status: AC
  Administered 2015-11-09: 2 mg via INTRAVENOUS
  Filled 2015-11-09: qty 1

## 2015-11-09 MED ORDER — ONDANSETRON HCL 4 MG/2ML IJ SOLN
4.0000 mg | Freq: Four times a day (QID) | INTRAMUSCULAR | Status: DC | PRN
  Administered 2015-11-10 (×3): 4 mg via INTRAVENOUS
  Filled 2015-11-09 (×3): qty 2

## 2015-11-09 MED ORDER — INSULIN REGULAR BOLUS VIA INFUSION
0.0000 [IU] | Freq: Three times a day (TID) | INTRAVENOUS | Status: DC
  Filled 2015-11-09: qty 10

## 2015-11-09 MED ORDER — ACETAMINOPHEN 650 MG RE SUPP: 650.0000 mg | Freq: Four times a day (QID) | RECTAL | Status: DC | PRN

## 2015-11-09 MED ORDER — ESCITALOPRAM OXALATE 10 MG PO TABS
5.0000 mg | ORAL_TABLET | Freq: Every day | ORAL | Status: DC
  Administered 2015-11-11 – 2015-11-12 (×2): 5 mg via ORAL
  Filled 2015-11-09 (×2): qty 1

## 2015-11-09 MED ORDER — MONTELUKAST SODIUM 10 MG PO TABS
10.0000 mg | ORAL_TABLET | Freq: Every day | ORAL | Status: DC
  Administered 2015-11-11 – 2015-11-12 (×2): 10 mg via ORAL
  Filled 2015-11-09 (×2): qty 1

## 2015-11-09 MED ORDER — ONDANSETRON HCL 4 MG PO TABS: 4.0000 mg | ORAL_TABLET | Freq: Four times a day (QID) | ORAL | Status: DC | PRN

## 2015-11-09 MED ORDER — DEXTROSE-NACL 5-0.45 % IV SOLN
INTRAVENOUS | Status: DC
  Administered 2015-11-09 – 2015-11-10 (×2): via INTRAVENOUS

## 2015-11-09 MED ORDER — METOCLOPRAMIDE HCL 5 MG/ML IJ SOLN
10.0000 mg | Freq: Once | INTRAMUSCULAR | Status: AC
  Administered 2015-11-09: 10 mg via INTRAVENOUS
  Filled 2015-11-09: qty 2

## 2015-11-09 MED ORDER — METOCLOPRAMIDE HCL 5 MG/ML IJ SOLN
5.0000 mg | Freq: Four times a day (QID) | INTRAMUSCULAR | Status: DC
  Administered 2015-11-09 – 2015-11-12 (×10): 5 mg via INTRAVENOUS
  Filled 2015-11-09 (×10): qty 2

## 2015-11-09 MED ORDER — INSULIN REGULAR HUMAN 100 UNIT/ML IJ SOLN
INTRAMUSCULAR | Status: DC
  Administered 2015-11-09: 3.1 [IU]/h via INTRAVENOUS
  Filled 2015-11-09: qty 2.5

## 2015-11-09 MED ORDER — MOMETASONE FURO-FORMOTEROL FUM 200-5 MCG/ACT IN AERO
2.0000 | INHALATION_SPRAY | Freq: Two times a day (BID) | RESPIRATORY_TRACT | Status: DC
  Administered 2015-11-09 – 2015-11-11 (×5): 2 via RESPIRATORY_TRACT
  Filled 2015-11-09 (×2): qty 8.8

## 2015-11-09 MED ORDER — ENOXAPARIN SODIUM 40 MG/0.4ML ~~LOC~~ SOLN
40.0000 mg | SUBCUTANEOUS | Status: DC
  Administered 2015-11-09 – 2015-11-11 (×3): 40 mg via SUBCUTANEOUS
  Filled 2015-11-09 (×3): qty 0.4

## 2015-11-09 MED ORDER — OXYCODONE HCL 5 MG PO TABS: 5.0000 mg | ORAL_TABLET | ORAL | Status: DC | PRN

## 2015-11-09 MED ORDER — ONDANSETRON HCL 4 MG/2ML IJ SOLN
4.0000 mg | Freq: Once | INTRAMUSCULAR | Status: AC
  Administered 2015-11-09: 4 mg via INTRAVENOUS
  Filled 2015-11-09: qty 2

## 2015-11-09 MED ORDER — SODIUM CHLORIDE 0.9% FLUSH
3.0000 mL | Freq: Two times a day (BID) | INTRAVENOUS | Status: DC
  Administered 2015-11-09 – 2015-11-12 (×5): 3 mL via INTRAVENOUS

## 2015-11-09 NOTE — ED Notes (Signed)
RT called for ABG.

## 2015-11-09 NOTE — ED Notes (Signed)
RT at bedside for ABG

## 2015-11-09 NOTE — Progress Notes (Signed)
Inpatient Diabetes Program Recommendations  AACE/ADA: New Consensus Statement on Inpatient Glycemic Control (2015)  Target Ranges:  Prepandial:   less than 140 mg/dL      Peak postprandial:   less than 180 mg/dL (1-2 hours)      Critically ill patients:  140 - 180 mg/dL   Results for BREYON, BLASS (MRN [id number]) as of 11/09/2015 21:44  Ref. Range 11/09/2015 16:08  Sodium Latest Ref Range: 135 - 145 mmol/L 138  Potassium Latest Ref Range: 3.5 - 5.1 mmol/L 4.4  Chloride Latest Ref Range: 101 - 111 mmol/L 103  CO2 Latest Ref Range: 22 - 32 mmol/L 16 (L)  BUN Latest Ref Range: 6 - 20 mg/dL 19  Creatinine Latest Ref Range: 0.44 - 1.00 mg/dL 0.78  Calcium Latest Ref Range: 8.9 - 10.3 mg/dL 10.0  EGFR (Non-African Amer.) Latest Ref Range: >60 mL/min >60  EGFR (African American) Latest Ref Range: >60 mL/min >60  Glucose Latest Ref Range: 65 - 99 mg/dL 284 (H)  Anion gap Latest Ref Range: 5 - 15  19 (H)     Admit with: DKA  History: DM1, Repeat DKA, Gastroparesis  Home DM Meds: Basaglar (Insulin glargine) 24 units QHS       Novolog 0-20 units QID per SSI  Current Insulin Orders: IV Insulin drip     -Patient well known to the Inpatient DM Program.  This is patient's 11th admission since January of this year.  -Has been counseled about the importance of glucose control during past admissions.  Has stated to our team before that she "knows what to do" and just needs "to do better".    -Per review of Care Everywhere tab, found that patient saw her Endocrinologist Dr. Hartford Poli with [facility] on 10/02/15.  At that visit, patient was counseled at length about the dangers of uncontrolled CBGs.  Review of her CBG meter showed she was only checking her CBGs 0-1 times per day.  Patient then admitted to missing Novolog doses often and has trouble taking charge of her diabetes.  Told Dr. Hartford Poli that she just wants to "be normal".  Dr. Hartford Poli reinforced to patient that her diabetes is not going to go  away and that she has to take responsibility for her health so that she can continue to care for her young child.  Patient was referred to the Outpatient Diabetes Educator with [facility] and was also referred to Mental Health counselor.  Unsure of pt has followed up with Mental Health referral.  -At that visit, Ms. Woodell was instructed to: 1. Reduce her Basaglar (insulin glargine) to 20 units QHS 2. Continue Novolog 1 unit for every 10 grams Carbohydrates eaten 2. Continue Novolog 1 unit for every 50 mg/dl above target CBG of 150 mg/dl     MD- Once patient ready to transition to SQ Insulin (CO2 20, Anion Gap less than 12), please consider the following:  1. Start Lantus 20 units daily (make sure to give 1st Lantus dose at least 1 hour before IV Insulin drip stopped)  2. Start Novolog Sensitive Correction Scale/ SSI (0-9 units) TID AC + HS  3. Start Novolog Meal Coverage: Novolog 4 units TIDWC (hold if pt eats <50% of meal)       --Will follow patient during hospitalization--  Wyn Quaker RN, MSN, CDE Diabetes Coordinator Inpatient Glycemic Control Team Team Pager: [phone] (8a-5p)

## 2015-11-09 NOTE — H&P (Signed)
[name] [id number] DOB: November 14, 1990 DOA: 11/09/2015     PCP: Vicenta Aly, FNP   Outpatient Specialists: Kenn File unable to see since has no insurance Patient coming from:   home Lives   With family    Chief Complaint: Nausea vomiting diarrhea  HPI: [name] is a 25 y.o. female with medical history significant of DM1, gastroparesis, HTN, pancreatitis, asthma, and anxiety    Presented with acute onset of intractable vomiting similar to prior presentations. Patient reports generalized abdominal pain nausea vomiting since this morning this was associated diarrhea. She has had a few watery BM denies any antibiotics exposure no sick contacts. She report frequent bouts of diarrhea since she had cholecystectomy.   She was discharged 4 days ago for the same presentation. Patient attempted to use Zofran and Phenergan and Reglan at home but did not help.  she's been smoking marijuana on a regular basis. Patient denies noncompliance of insulin no fevers or chills no chest pain or shortness of breath  she reports normal voiding.  She reports that the last vomitus was brown no bright red no coffee ground.    Regarding pertinent Chronic problems: She has recurrent admissions for DKA type 1 diabetes and gastroparesis as well as marijuana induced hyperemesis During her last admission her stay was complicated by bladder distention and mild bilateral hydronephrosis improved with foleycatheter placement. Gastroparesis in the past improved with Reglan and 19 medics A1c 9.2 from 05/2015. IN ER:  Temp (24hrs), Avg:97.5 F (36.4 C), Min:97.5 F (36.4 C), Max:97.5 F (36.4 C)     Initially heart rate up to 140 patient was found to be very uncomfortable she was given Reglan Zofran and Haldol is no significant improvement in nausea and patient was trying to be assisted to use the bathroom patient apparently has collapsed. She continues to be vomiting activity in emergency  department  Bladder scan was done showing 999 mL  Heart rate 1:30 BP 149/99 glucose 330  Lactic acid 2.26 Sodium 137 potassium 5.5 creatinine 0.78 bicarbonate 16 albumin 5.1 WBC 9.4 hemoglobin 14.1 Anion gap 19  7.281/27/94  Following Medications were ordered in ER: Medications  dextrose 5 %-0.45 % sodium chloride infusion (not administered)  insulin regular bolus via infusion 0-10 Units (not administered)  insulin regular (NOVOLIN R,HUMULIN R) 250 Units in sodium chloride 0.9 % 250 mL (1 Units/mL) infusion (5.4 Units/hr Intravenous Rate/Dose Change 11/09/15 1854)  dextrose 50 % solution 25 mL (not administered)  0.9 %  sodium chloride infusion (not administered)  promethazine (PHENERGAN) injection 25 mg (not administered)  sodium chloride 0.9 % bolus 1,000 mL (0 mLs Intravenous Stopped 11/09/15 1853)  sodium chloride 0.9 % bolus 1,000 mL (0 mLs Intravenous Stopped 11/09/15 1728)  metoCLOPramide (REGLAN) injection 10 mg (10 mg Intravenous Given 11/09/15 1621)  ondansetron (ZOFRAN) injection 4 mg (4 mg Intravenous Given 11/09/15 1621)  haloperidol lactate (HALDOL) injection 2 mg (2 mg Intravenous Given 11/09/15 1747)     Hospitalist was called for admission for DKA in a setting of dehydration and urinary retention  Review of Systems:    Pertinent positives include:  abdominal pain, nausea, vomiting, diarrhea  loss of appetite, Constitutional:  No weight loss, night sweats, Fevers, chills, fatigue, weight loss  HEENT:  No headaches, Difficulty swallowing,Tooth/dental problems,Sore throat,  No sneezing, itching, ear ache, nasal congestion, post nasal drip,  Cardio-vascular:  No chest pain, Orthopnea, PND, anasarca, dizziness, palpitations.no Bilateral lower extremity swelling  GI:  No heartburn, indigestion change in bowel  habits, melena, blood in stool, hematemesis Resp:  no shortness of breath at rest. No dyspnea on exertion, No excess mucus, no productive cough, No  non-productive cough, No coughing up of blood.No change in color of mucus.No wheezing. Skin:  no rash or lesions. No jaundice GU:  no dysuria, change in color of urine, no urgency or frequency. No straining to urinate.  No flank pain.  Musculoskeletal:  No joint pain or no joint swelling. No decreased range of motion. No back pain.  Psych:  No change in mood or affect. No depression or anxiety. No memory loss.  Neuro: no localizing neurological complaints, no tingling, no weakness, no double vision, no gait abnormality, no slurred speech, no confusion  As per HPI otherwise 10 point review of systems negative.   Past Medical History: Past Medical History:  Diagnosis Date  . Anxiety   . Arthritis   . Asthma   . Diabetes mellitus 2007   IDDM.  poorly controlled, multiple admits with DKA  . Gallstones   . Heart murmur   . Hepatic steatosis 11/26/2014   and hepatomegaly  . Hypertension    NOT CURRENTLY ON ANY BP MED  . Liver mass 11/26/2014  . Pancreatitis, acute 11/26/2014   Past Surgical History:  Procedure Laterality Date  . CHOLECYSTECTOMY N/A 02/11/2015   Procedure: LAPAROSCOPIC CHOLECYSTECTOMY WITH INTRAOPERATIVE CHOLANGIOGRAM;  Surgeon: Greer Pickerel, MD;  Location: [facility];  Service: General;  Laterality: N/A;  . ESOPHAGOGASTRODUODENOSCOPY (EGD) WITH PROPOFOL Left 09/20/2014   Procedure: ESOPHAGOGASTRODUODENOSCOPY (EGD) WITH PROPOFOL;  Surgeon: Arta Silence, MD;  Location: [facility] ENDOSCOPY;  Service: Endoscopy;  Laterality: Left;  . WISDOM TOOTH EXTRACTION       Social History:  Ambulatory  independently     reports that she has never smoked. She has never used smokeless tobacco. She reports that she uses drugs, including Marijuana. She reports that she does not drink alcohol.  Allergies:   Allergies  Allergen Reactions  . Peanut-Containing Drug Products Swelling    Swelling of mouth, lips  . Strawberry Extract Swelling    Swelling of mouth, lips  . Ultram [Tramadol]  Itching       Family History:  Family History  Problem Relation Age of Onset  . Heart disease Maternal Grandmother   . Heart disease Maternal Grandfather   . Diabetes Mother   . Hyperlipidemia Mother   . Hypertension Father   . Heart disease Father   . Hypertension Paternal Grandmother   . Cancer Paternal Grandfather     Medications: Prior to Admission medications   Medication Sig Start Date End Date Taking? Authorizing Provider  albuterol (PROVENTIL HFA;VENTOLIN HFA) 108 (90 BASE) MCG/ACT inhaler Inhale 2 puffs into the lungs every 6 (six) hours as needed for shortness of breath.   Yes Historical Provider, MD  albuterol (PROVENTIL) (2.5 MG/3ML) 0.083% nebulizer solution Take 2.5 mg by nebulization every 6 (six) hours as needed for shortness of breath.   Yes Historical Provider, MD  budesonide-formoterol (SYMBICORT) 160-4.5 MCG/ACT inhaler Inhale 2 puffs into the lungs daily.    Yes Historical Provider, MD  escitalopram (LEXAPRO) 10 MG tablet Take 5 mg by mouth daily. 06/20/15 06/19/16 Yes Historical Provider, MD  insulin aspart (NOVOLOG FLEXPEN) 100 UNIT/ML FlexPen INJECT 0-20 UNITS INTO THE SKIN AS DIRECTED. SSI - CARB SCALE, 0-20 UNITS Patient taking differently: Inject 0-20 Units into the skin 4 (four) times daily as needed for high blood sugar. Sliding scale 10/19/13  Yes Elayne Snare, MD  Insulin  Glargine (BASAGLAR KWIKPEN [place]) Inject 24 Units into the skin at bedtime.   Yes Historical Provider, MD  metoCLOPramide (REGLAN) 10 MG tablet Take 1 tablet (10 mg total) by mouth 3 (three) times daily before meals. 11/06/15  Yes Janece Canterbury, MD  montelukast (SINGULAIR) 10 MG tablet Take 10 mg by mouth daily.   Yes Historical Provider, MD  promethazine (PHENERGAN) 25 MG tablet Take 1 tablet (25 mg total) by mouth every 6 (six) hours as needed for refractory nausea / vomiting. 11/06/15  Yes Janece Canterbury, MD  omeprazole (PRILOSEC) 40 MG capsule Take 1 capsule (40 mg total) by mouth  daily. Patient not taking: Reported on 11/09/2015 04/23/15   Orson Eva, MD  ondansetron (ZOFRAN) 4 MG tablet Take 1 tablet (4 mg total) by mouth every 8 (eight) hours as needed for nausea or vomiting. Patient not taking: Reported on 11/09/2015 07/13/15   Thurnell Lose, MD  promethazine (PHENERGAN) 25 MG suppository Place 1 suppository (25 mg total) rectally every 6 (six) hours as needed for nausea or vomiting. Patient not taking: Reported on 11/09/2015 07/13/15   Thurnell Lose, MD    Physical Exam: Patient Vitals for the past 24 hrs:  BP Temp Pulse Resp SpO2  11/09/15 1701 141/75 - 102 18 93 %  11/09/15 1450 (!) 162/123 - (!) 141 20 100 %  11/09/15 1449 - 97.5 F (36.4 C) - - -    1. General:  in No Acute distress 2. Psychological: Alert and Oriented 3. Head/ENT:     Dry Mucous Membranes                          Head Non traumatic, neck supple                          Normal  Dentition 4. SKIN:  decreased Skin turgor,  Skin clean Dry and intact no rash 5. Heart:Tachycardic Regular rate and rhythm no  Murmur, Rub or gallop 6. Lungs:  Clear to auscultation bilaterally, no wheezes or crackles   7. Abdomen: Soft, mildly tender distended palpable bladder,   distended 8. Lower extremities: no clubbing, cyanosis, or edema 9. Neurologically Grossly intact, moving all 4 extremities equally  10. MSK: Normal range of motion   body mass index is unknown because there is no height or weight on file.  Labs on Admission:   Labs on Admission: I have personally reviewed following labs and imaging studies  CBC:  Recent Labs Lab 11/03/15 0323 11/05/15 0344 11/09/15 1608 11/09/15 1623  WBC 11.4* 4.9 9.4  --   NEUTROABS  --   --  8.2*  --   HGB 13.7 9.8* 14.1 15.6*  HCT 40.2 28.7* 42.0 46.0  MCV 92.0 89.1 91.1  --   PLT 397 268 512*  --    Basic Metabolic Panel:  Recent Labs Lab 11/03/15 0728  11/04/15 0345  11/04/15 1515 11/04/15 2316 11/05/15 0344 11/05/15 1813  11/06/15 0457 11/09/15 1608 11/09/15 1623  NA 132*  < > 138  < > 140 141 141  --  139 138 137  K 4.8  < > 3.0*  < > 3.2* 3.0* 2.6* 3.5 3.6 4.4 4.5  CL 110  < > 108  < > 109 113* 113*  --  108 103 106  CO2 <7*  < > 22  < > 26 20* 24  --  26 16*  --  GLUCOSE 278*  < > 152*  < > 162* 195* 147*  --  191* 284* 287*  BUN <5*  < > <5*  < > <5* <5* <5*  --  <5* 19 21*  CREATININE 0.76  < > 0.47  < > 0.52 0.49 0.42*  --  0.37* 0.78 0.40*  CALCIUM 8.3*  < > 7.9*  < > 7.9* 7.5* 7.4*  --  8.3* 10.0  --   MG 1.9  --  1.8  --   --   --  2.0  --   --   --   --   PHOS 1.5*  --  1.4*  --   --   --  1.8*  --  2.3*  --   --   < > = values in this interval not displayed. GFR: Estimated Creatinine Clearance: 92 mL/min (by C-G formula based on SCr of 0.4 mg/dL (L)). Liver Function Tests:  Recent Labs Lab 11/09/15 1608  AST 27  ALT 16  ALKPHOS 129*  BILITOT 1.5*  PROT 9.3*  ALBUMIN 5.1*   No results for input(s): LIPASE, AMYLASE in the last 168 hours. No results for input(s): AMMONIA in the last 168 hours. Coagulation Profile: No results for input(s): INR, PROTIME in the last 168 hours. Cardiac Enzymes:  Recent Labs Lab 11/03/15 0323  TROPONINI <0.03   BNP (last 3 results) No results for input(s): PROBNP in the last 8760 hours. HbA1C: No results for input(s): HGBA1C in the last 72 hours. CBG:  Recent Labs Lab 11/05/15 1716 11/05/15 2200 11/06/15 0724 11/09/15 1700 11/09/15 1827  GLUCAP 170* 218* 153* 371* 330*   Lipid Profile: No results for input(s): CHOL, HDL, LDLCALC, TRIG, CHOLHDL, LDLDIRECT in the last 72 hours. Thyroid Function Tests: No results for input(s): TSH, T4TOTAL, FREET4, T3FREE, THYROIDAB in the last 72 hours. Anemia Panel: No results for input(s): VITAMINB12, FOLATE, FERRITIN, TIBC, IRON, RETICCTPCT in the last 72 hours. Urine analysis:    Component Value Date/Time   COLORURINE YELLOW 11/09/2015 1749   APPEARANCEUR CLOUDY (A) 11/09/2015 1749   LABSPEC  1.026 11/09/2015 1749   PHURINE 6.0 11/09/2015 1749   GLUCOSEU >1000 (A) 11/09/2015 1749   GLUCOSEU >=1000 11/07/2012 1205   HGBUR NEGATIVE 11/09/2015 1749   BILIRUBINUR NEGATIVE 11/09/2015 1749   KETONESUR >80 (A) 11/09/2015 1749   PROTEINUR NEGATIVE 11/09/2015 1749   UROBILINOGEN 0.2 11/24/2014 1045   NITRITE NEGATIVE 11/09/2015 1749   LEUKOCYTESUR NEGATIVE 11/09/2015 1749   Sepsis Labs: @LABRCNTIP (procalcitonin:4,lacticidven:4) ) Recent Results (from the past 240 hour(s))  MRSA PCR Screening     Status: None   Collection Time: 11/03/15  3:06 AM  Result Value Ref Range Status   MRSA by PCR NEGATIVE NEGATIVE Final    Comment:        The GeneXpert MRSA Assay (FDA approved for NASAL specimens only), is one component of a comprehensive MRSA colonization surveillance program. It is not intended to diagnose MRSA infection nor to guide or monitor treatment for MRSA infections.      UA  no evidence of UTI    Lab Results  Component Value Date   HGBA1C 9.2 (H) 05/25/2015    Estimated Creatinine Clearance: 92 mL/min (by C-G formula based on SCr of 0.4 mg/dL (L)).  BNP (last 3 results) No results for input(s): PROBNP in the last 8760 hours.   ECG REPORT  Independently reviewed Rate: 116  Rhythm: Sinus tachycardia ST&T Change: No acute ischemic changes   QTC 442  There were no vitals filed for this visit.   Cultures:    Component Value Date/Time   SDES BLOOD RIGHT HAND 11/24/2014 2020   SPECREQUEST BOTTLES DRAWN AEROBIC ONLY 10CC 11/24/2014 2020   CULT  11/24/2014 2020    NO GROWTH 5 DAYS Performed at [place] 11/30/2014 FINAL 11/24/2014 2020     Radiological Exams on Admission: No results found.  Chart has been reviewed    Assessment/Plan   26 y.o. female with medical history significant of DM1, gastroparesis, HTN, pancreatitis, asthma, and anxiety being admitted for DKA in the setting of acute urinary retention nausea vomiting  diarrhea  Present on Admission: . DKA (diabetic ketoacidoses) ([place]) - will admit per DKA protocol, obtain serial BMET, start on glucosestabalizer, aggressive IVF. Change IVF to D5 1/2Na after BG <250 . Will work up cause of DKA with CXR, ECG one set of cardiac enzymes, UA. Monitor in [place]. Replace potassium as needed.   Check magnesium and phosphate level  . Asthma - Chronic stable continue home medications . Chronic diastolic heart failure ([facility]) currently appears to be dehydrated we will monitor for fluid overload . Dehydration rehydrate . Diabetic gastroparesis ([place]) patient has benefited from [place] also has used deceased 1 dose of Haldol given history of marijuana abuse. Patient has some urinary retention will attempt to treat and see if symptoms improved   . Intractable nausea and vomiting supportive management with scheduled Reglan when necessary Zofran . Protein-calorie malnutrition, severe torsional consult check prealbumin . Diarrhea check gastric panel. Given chronic illness check for c.diff    Other plan as per orders.  DVT prophylaxis:  Lovenox     Code Status:  FULL CODE   Family Communication:   Family not  at  Bedside    Disposition Plan:  To home once workup is complete and patient is stable                          Diabetes coordinator   Nutrition  consulted                          Consults called: none   Admission status:    inpatient      Level of care        SDU      I have spent a total of 57 min on this admission     Britlyn Martine 11/09/2015, 7:42 PM    [facility]  Pager [phone]   after 2 AM please page floor coverage PA If 7AM-7PM, please contact the day team taking care of the patient  [email or web address]  Password [email or web address]

## 2015-11-09 NOTE — ED Notes (Signed)
Failed attmpt to collect I stat

## 2015-11-09 NOTE — ED Notes (Signed)
Bladder scanner: 999ML

## 2015-11-09 NOTE — ED Triage Notes (Signed)
Pt complains of generalized abdominal pain, n/v/d since this morning. Pt has hx of gastroparesis and diabetes.

## 2015-11-09 NOTE — ED Notes (Signed)
Pt started to throw up over trash can after using the restroom. When walking to the restroom to assist her she collapsed as soon as I grabbed her hand. I was able to pick her up and place her in a wheel chair with the help of Rica Mote, Therapist, sports, and a Engineer, structural on duty.

## 2015-11-09 NOTE — ED Provider Notes (Signed)
[facility] DEPT Provider Note   CSN: [id number] Arrival date & time: 11/09/15  1440     History   Chief Complaint Chief Complaint  Patient presents with  . Abdominal Pain  . Emesis  . Diarrhea    HPI [name] is a 25 y.o. female.  HPI [name] is a 25 y.o. female with hx of diabetes mellitus type 1, poorly controlled, gastroparesis, htn, marijuana abuse, presents to ED with nausea, vomiting, diarrhea, abdominal pain, weakness. Pt was just discharged from the hospital 4 days ago for the same. States symptoms return this morning. Reports numerous episodes of watery diarrhea, nausea, vomiting. Tried taking zofran, phenergan, reglan at home with no relief. Admits she continues  to smoke marijuana, reports compliance with her insulin. Denies urinary symptoms. No back pain. No chest pain or SOB.   Past Medical History:  Diagnosis Date  . Anxiety   . Arthritis   . Asthma   . Diabetes mellitus 2007   IDDM.  poorly controlled, multiple admits with DKA  . Gallstones   . Heart murmur   . Hepatic steatosis 11/26/2014   and hepatomegaly  . Hypertension    NOT CURRENTLY ON ANY BP MED  . Liver mass 11/26/2014  . Pancreatitis, acute 11/26/2014    Patient Active Problem List   Diagnosis Date Noted  . Acute urinary retention   . Gastroparesis due to DM ([place]) 07/10/2015  . GERD (gastroesophageal reflux disease) 07/10/2015  . Depression with anxiety 07/10/2015  . Uncontrolled type 1 diabetes mellitus with hyperglycemia ([place])   . Gastroparesis 06/22/2015  . Altered mental status 06/22/2015  . Volume depletion 06/10/2015  . Protein-calorie malnutrition, severe 06/10/2015  . Hyperglycemia   . Intractable vomiting with nausea   . Elevated bilirubin   . Type 1 diabetes mellitus with hyperglycemia ([place]) 05/29/2015  . Hematemesis with nausea   . Intractable vomiting 05/28/2015  . Intractable nausea and vomiting 05/28/2015  . Abdominal pain in female   . Abdominal  pain 05/24/2015  . Hypertension 05/24/2015  . Dehydration   . Diabetic gastroparesis ([place])   . Chronic diastolic heart failure ([place]) 04/11/2015  . Hematemesis 04/08/2015  . DKA (diabetic ketoacidoses) ([place]) 03/22/2015  . Nausea vomiting and diarrhea 03/22/2015  . Nausea & vomiting   . Diabetic ketoacidosis without coma associated with type 1 diabetes mellitus ([place])   . S/P laparoscopic cholecystectomy 02/11/2015  . Status post laparoscopic cholecystectomy 02/11/2015  . Postextubation stridor   . Nausea and vomiting 11/27/2014  . Pancreatitis, acute 11/26/2014  . Volume overload 11/26/2014  . Hypokalemia 11/26/2014  . Hepatic steatosis 11/26/2014  . Liver mass 11/26/2014  . Sepsis ([place]) 11/25/2014  . Sinus tachycardia 11/25/2014  . Hypomagnesemia 11/25/2014  . Hypophosphatemia 11/25/2014  . Elevated amylase and lipase 11/25/2014  . DKA, type 1 ([place]) 11/24/2014  . Elevated LFTs 11/24/2014  . Acute kidney injury ([place]) 11/24/2014  . Migraine headache 11/24/2014  . Asthma 06/29/2012  . Uncontrolled type 1 diabetes mellitus ([place]) 06/19/2010  . Goiter, unspecified 06/19/2010    Past Surgical History:  Procedure Laterality Date  . CHOLECYSTECTOMY N/A 02/11/2015   Procedure: LAPAROSCOPIC CHOLECYSTECTOMY WITH INTRAOPERATIVE CHOLANGIOGRAM;  Surgeon: Greer Pickerel, MD;  Location: [facility] ORS;  Service: General;  Laterality: N/A;  . ESOPHAGOGASTRODUODENOSCOPY (EGD) WITH PROPOFOL Left 09/20/2014   Procedure: ESOPHAGOGASTRODUODENOSCOPY (EGD) WITH PROPOFOL;  Surgeon: Arta Silence, MD;  Location: [facility] ENDOSCOPY;  Service: Endoscopy;  Laterality: Left;  . WISDOM TOOTH EXTRACTION      OB History  Gravida Para Term Preterm AB Living   2 1 0 1 1 1    SAB TAB Ectopic Multiple Live Births   0 1 0 0 1       Home Medications    Prior to Admission medications   Medication Sig Start Date End Date Taking? Authorizing Provider  albuterol (PROVENTIL HFA;VENTOLIN HFA) 108 (90 BASE) MCG/ACT inhaler  Inhale 2 puffs into the lungs every 6 (six) hours as needed for shortness of breath.    Historical Provider, MD  albuterol (PROVENTIL) (2.5 MG/3ML) 0.083% nebulizer solution Take 2.5 mg by nebulization every 6 (six) hours as needed for shortness of breath.    Historical Provider, MD  budesonide-formoterol (SYMBICORT) 160-4.5 MCG/ACT inhaler Inhale 2 puffs into the lungs daily.     Historical Provider, MD  escitalopram (LEXAPRO) 10 MG tablet Take 5 mg by mouth daily. 06/20/15 06/19/16  Historical Provider, MD  insulin aspart (NOVOLOG FLEXPEN) 100 UNIT/ML FlexPen INJECT 0-20 UNITS INTO THE SKIN AS DIRECTED. SSI - CARB SCALE, 0-20 UNITS Patient taking differently: Inject 0-20 Units into the skin 4 (four) times daily as needed for high blood sugar. Sliding scale 10/19/13   Elayne Snare, MD  Insulin Glargine (BASAGLAR KWIKPEN [place]) Inject 24 Units into the skin at bedtime.    Historical Provider, MD  metoCLOPramide (REGLAN) 10 MG tablet Take 1 tablet (10 mg total) by mouth 3 (three) times daily before meals. 11/06/15   Janece Canterbury, MD  montelukast (SINGULAIR) 10 MG tablet Take 10 mg by mouth daily.    Historical Provider, MD  omeprazole (PRILOSEC) 40 MG capsule Take 1 capsule (40 mg total) by mouth daily. Patient not taking: Reported on 11/02/2015 04/23/15   Orson Eva, MD  ondansetron (ZOFRAN) 4 MG tablet Take 1 tablet (4 mg total) by mouth every 8 (eight) hours as needed for nausea or vomiting. Patient not taking: Reported on 11/02/2015 07/13/15   Thurnell Lose, MD  promethazine (PHENERGAN) 25 MG suppository Place 1 suppository (25 mg total) rectally every 6 (six) hours as needed for nausea or vomiting. Patient not taking: Reported on 11/02/2015 07/13/15   Thurnell Lose, MD  promethazine (PHENERGAN) 25 MG tablet Take 1 tablet (25 mg total) by mouth every 6 (six) hours as needed for refractory nausea / vomiting. 11/06/15   Janece Canterbury, MD    Family History Family History  Problem Relation Age of  Onset  . Heart disease Maternal Grandmother   . Heart disease Maternal Grandfather   . Diabetes Mother   . Hyperlipidemia Mother   . Hypertension Father   . Heart disease Father   . Hypertension Paternal Grandmother   . Cancer Paternal Grandfather     Social History Social History  Substance Use Topics  . Smoking status: Never Smoker  . Smokeless tobacco: Never Used  . Alcohol use No     Allergies   Peanut-containing drug products; Strawberry extract; and Ultram [tramadol]   Review of Systems Review of Systems  Constitutional: Negative for chills and fever.  Respiratory: Negative for cough, chest tightness and shortness of breath.   Cardiovascular: Negative for chest pain, palpitations and leg swelling.  Gastrointestinal: Positive for abdominal pain, diarrhea, nausea and vomiting.  Genitourinary: Negative for dysuria, flank pain, pelvic pain, vaginal bleeding, vaginal discharge and vaginal pain.  Musculoskeletal: Negative for arthralgias, myalgias, neck pain and neck stiffness.  Skin: Negative for rash.  Neurological: Negative for dizziness, weakness and headaches.  All other systems reviewed and are negative.  Physical Exam Updated Vital Signs BP (!) 162/123 (BP Location: Right Arm)   Pulse (!) 141   Temp 97.5 F (36.4 C)   Resp 20   LMP 10/19/2015   SpO2 100%   Physical Exam  Constitutional: She appears well-developed and well-nourished.  Pt rolling around in bed, unable to keep still. Hitting her head on the rails of the stretcher.   HENT:  Head: Normocephalic.  Eyes: Conjunctivae are normal.  Neck: Neck supple.  Cardiovascular: Regular rhythm and normal heart sounds.   tachycardic  Pulmonary/Chest: Effort normal and breath sounds normal. No respiratory distress. She has no wheezes. She has no rales.  Abdominal: Soft. Bowel sounds are normal. She exhibits no distension. There is tenderness. There is no rebound.  Diffuse tenderness  Genitourinary:    Genitourinary Comments: [name]  Musculoskeletal: She exhibits no edema.  Neurological: She is alert.  Skin: Skin is warm and dry.  Psychiatric: She has a normal mood and affect. Her behavior is normal.  Nursing note and vitals reviewed.    ED Treatments / Results  Labs (all labs ordered are listed, but only abnormal results are displayed) Labs Reviewed  CBC WITH DIFFERENTIAL/PLATELET - Abnormal; Notable for the following:       Result Value   Platelets 512 (*)    Neutro Abs 8.2 (*)    All other components within normal limits  COMPREHENSIVE METABOLIC PANEL - Abnormal; Notable for the following:    CO2 16 (*)    Glucose, Bld 284 (*)    Total Protein 9.3 (*)    Albumin 5.1 (*)    Alkaline Phosphatase 129 (*)    Total Bilirubin 1.5 (*)    Anion gap 19 (*)    All other components within normal limits  URINALYSIS, ROUTINE W REFLEX MICROSCOPIC (NOT AT [facility]) - Abnormal; Notable for the following:    APPearance CLOUDY (*)    Glucose, UA >1000 (*)    Ketones, ur >80 (*)    All other components within normal limits  BLOOD GAS, ARTERIAL - Abnormal; Notable for the following:    pH, Arterial 7.281 (*)    pCO2 arterial 27.1 (*)    Bicarbonate 12.4 (*)    Acid-base deficit 12.8 (*)    All other components within normal limits  URINE MICROSCOPIC-ADD ON - Abnormal; Notable for the following:    Squamous Epithelial / LPF 6-30 (*)    Bacteria, UA RARE (*)    All other components within normal limits  BASIC METABOLIC PANEL - Abnormal; Notable for the following:    Chloride 113 (*)    CO2 13 (*)    Glucose, Bld 183 (*)    Calcium 8.7 (*)    All other components within normal limits  PHOSPHORUS - Abnormal; Notable for the following:    Phosphorus 2.2 (*)    All other components within normal limits  BETA-HYDROXYBUTYRIC ACID - Abnormal; Notable for the following:    Beta-Hydroxybutyric Acid 4.74 (*)    All other components within normal limits  RAPID URINE DRUG SCREEN, HOSP PERFORMED -  Abnormal; Notable for the following:    Tetrahydrocannabinol POSITIVE (*)    All other components within normal limits  GLUCOSE, CAPILLARY - Abnormal; Notable for the following:    Glucose-Capillary 206 (*)    All other components within normal limits  GLUCOSE, CAPILLARY - Abnormal; Notable for the following:    Glucose-Capillary 142 (*)    All other components within normal limits  GLUCOSE,  CAPILLARY - Abnormal; Notable for the following:    Glucose-Capillary 123 (*)    All other components within normal limits  GLUCOSE, CAPILLARY - Abnormal; Notable for the following:    Glucose-Capillary 161 (*)    All other components within normal limits  I-STAT CG4 LACTIC ACID, ED - Abnormal; Notable for the following:    Lactic Acid, Venous 2.26 (*)    All other components within normal limits  I-STAT CHEM 8, ED - Abnormal; Notable for the following:    BUN 21 (*)    Creatinine, Ser 0.40 (*)    Glucose, Bld 287 (*)    Calcium, Ion 1.13 (*)    Hemoglobin 15.6 (*)    All other components within normal limits  CBG MONITORING, ED - Abnormal; Notable for the following:    Glucose-Capillary 371 (*)    All other components within normal limits  CBG MONITORING, ED - Abnormal; Notable for the following:    Glucose-Capillary 330 (*)    All other components within normal limits  CBG MONITORING, ED - Abnormal; Notable for the following:    Glucose-Capillary 222 (*)    All other components within normal limits  GASTROINTESTINAL PANEL BY PCR, STOOL (REPLACES STOOL CULTURE)  C DIFFICILE QUICK SCREEN W PCR REFLEX  TROPONIN I  MAGNESIUM  VOLATILES,BLD-ACETONE,ETHANOL,ISOPROP,METHANOL  PREALBUMIN  MAGNESIUM  PHOSPHORUS  TSH  COMPREHENSIVE METABOLIC PANEL  CBC  BASIC METABOLIC PANEL  BASIC METABOLIC PANEL  TROPONIN I  TROPONIN I  HEMOGLOBIN [id number]  BASIC METABOLIC PANEL  I-STAT BETA HCG BLOOD, ED ([facility], [facility], [facility] ONLY)  I-STAT CG4 LACTIC ACID, ED    EKG  EKG Interpretation None        Radiology  Procedures Procedures (including critical care time)  Medications Ordered in ED   Initial Impression / Assessment and Plan / ED Course  I have reviewed the triage vital signs and the nursing notes.  Pertinent labs & imaging results that were available during my care of the patient were reviewed by me and considered in my medical decision making (see chart for details).  Clinical Course   3:21 PM Patient seen and examined. Patient with nausea, vomiting, diarrhea, abdominal pain. History of gastroparesis and cyclical vomiting syndrome. Patient is tachycardic, heart rate is 140s. She appears to be uncomfortable. Will get labs, start IV fluids, Reglan and Zofran ordered.  5:01 PM Pt still nauseated and vomiting. Will try haldol. Labs pending. Anion gap 10. Glucose 284. Will start on glucose stabilizer to help close the gap.   Patient admitted for gastroparesis/DKA. Beta hydroxybutyrate acid is elevated as well. Spoke with hospice, will place on stepdown. Pt receiving fluids and insulin. VS continue to show tachycardia. BP elevated. No evidence of sepsis.     Final Clinical Impressions(s) / ED Diagnoses   Final diagnoses:  Nausea vomiting and diarrhea  Diabetic ketoacidosis without coma associated with type 1 diabetes mellitus [facility])    New Prescriptions Current Discharge Medication List       Jeannett Senior, PA-C 11/10/15 0047    Malvin Johns, MD 11/10/15 1400

## 2015-11-09 NOTE — ED Notes (Signed)
Nurse is in the room doing a ultrasound IV

## 2015-11-10 ENCOUNTER — Inpatient Hospital Stay (HOSPITAL_COMMUNITY): Payer: Medicaid Other

## 2015-11-10 DIAGNOSIS — E1010 Type 1 diabetes mellitus with ketoacidosis without coma: Principal | ICD-10-CM

## 2015-11-10 DIAGNOSIS — E43 Unspecified severe protein-calorie malnutrition: Secondary | ICD-10-CM

## 2015-11-10 DIAGNOSIS — E1143 Type 2 diabetes mellitus with diabetic autonomic (poly)neuropathy: Secondary | ICD-10-CM

## 2015-11-10 DIAGNOSIS — I5032 Chronic diastolic (congestive) heart failure: Secondary | ICD-10-CM

## 2015-11-10 DIAGNOSIS — K3184 Gastroparesis: Secondary | ICD-10-CM

## 2015-11-10 DIAGNOSIS — E860 Dehydration: Secondary | ICD-10-CM

## 2015-11-10 DIAGNOSIS — R197 Diarrhea, unspecified: Secondary | ICD-10-CM

## 2015-11-10 LAB — URINE MICROSCOPIC-ADD ON

## 2015-11-10 LAB — BASIC METABOLIC PANEL
ANION GAP: 11 (ref 5–15)
ANION GAP: 7 (ref 5–15)
ANION GAP: 8 (ref 5–15)
ANION GAP: 14 (ref 5–15)
ANION GAP: 9 (ref 5–15)
BUN: 11 mg/dL (ref 6–20)
BUN: 9 mg/dL (ref 6–20)
BUN: 6 mg/dL (ref 6–20)
BUN: <5 mg/dL — ABNORMAL LOW (ref 6–20)
BUN: <5 mg/dL — ABNORMAL LOW (ref 6–20)
CHLORIDE: 107 mmol/L (ref 101–111)
CHLORIDE: 108 mmol/L (ref 101–111)
CHLORIDE: 106 mmol/L (ref 101–111)
CO2: 20 mmol/L — AB (ref 22–32)
CO2: 22 mmol/L (ref 22–32)
CO2: 23 mmol/L (ref 22–32)
CO2: 12 mmol/L — AB (ref 22–32)
CO2: 17 mmol/L — AB (ref 22–32)
Calcium: 8.9 mg/dL (ref 8.9–10.3)
Calcium: 8.5 mg/dL — ABNORMAL LOW (ref 8.9–10.3)
Calcium: 8.2 mg/dL — ABNORMAL LOW (ref 8.9–10.3)
Calcium: 8.6 mg/dL — ABNORMAL LOW (ref 8.9–10.3)
Calcium: 8.9 mg/dL (ref 8.9–10.3)
Chloride: 111 mmol/L (ref 101–111)
Chloride: 109 mmol/L (ref 101–111)
Creatinine, Ser: 0.6 mg/dL (ref 0.44–1.00)
Creatinine, Ser: 0.46 mg/dL (ref 0.44–1.00)
Creatinine, Ser: 0.48 mg/dL (ref 0.44–1.00)
Creatinine, Ser: 0.47 mg/dL (ref 0.44–1.00)
Creatinine, Ser: 0.51 mg/dL (ref 0.44–1.00)
GFR calc Af Amer: >60 mL/min (ref >60)
GFR calc Af Amer: >60 mL/min (ref >60)
GFR calc Af Amer: >60 mL/min (ref >60)
GFR calc non Af Amer: >60 mL/min (ref >60)
GFR calc non Af Amer: >60 mL/min (ref >60)
GLUCOSE: 136 mg/dL — AB (ref 65–99)
GLUCOSE: 149 mg/dL — AB (ref 65–99)
GLUCOSE: 278 mg/dL — AB (ref 65–99)
GLUCOSE: 161 mg/dL — AB (ref 65–99)
GLUCOSE: 163 mg/dL — AB (ref 65–99)
POTASSIUM: 3.4 mmol/L — AB (ref 3.5–5.1)
POTASSIUM: 3.7 mmol/L (ref 3.5–5.1)
POTASSIUM: 4.7 mmol/L (ref 3.5–5.1)
POTASSIUM: 3.1 mmol/L — AB (ref 3.5–5.1)
POTASSIUM: 4.1 mmol/L (ref 3.5–5.1)
Sodium: 137 mmol/L (ref 135–145)
Sodium: 135 mmol/L (ref 135–145)
Sodium: 137 mmol/L (ref 135–145)
Sodium: 138 mmol/L (ref 135–145)
Sodium: 137 mmol/L (ref 135–145)

## 2015-11-10 LAB — CBC
HEMATOCRIT: 36.5 % (ref 36.0–46.0)
HEMOGLOBIN: 12.2 g/dL (ref 12.0–15.0)
MCH: 30.3 pg (ref 26.0–34.0)
MCHC: 33.4 g/dL (ref 30.0–36.0)
MCV: 90.8 fL (ref 78.0–100.0)
Platelets: 511 10*3/uL — ABNORMAL HIGH (ref 150–400)
RBC: 4.02 MIL/uL (ref 3.87–5.11)
RDW: 14.1 % (ref 11.5–15.5)
WBC: 12.4 10*3/uL — AB (ref 4.0–10.5)

## 2015-11-10 LAB — GLUCOSE, CAPILLARY
GLUCOSE-CAPILLARY: 137 mg/dL — AB (ref 65–99)
GLUCOSE-CAPILLARY: 140 mg/dL — AB (ref 65–99)
GLUCOSE-CAPILLARY: 144 mg/dL — AB (ref 65–99)
GLUCOSE-CAPILLARY: 148 mg/dL — AB (ref 65–99)
GLUCOSE-CAPILLARY: 154 mg/dL — AB (ref 65–99)
GLUCOSE-CAPILLARY: 154 mg/dL — AB (ref 65–99)
GLUCOSE-CAPILLARY: 161 mg/dL — AB (ref 65–99)
GLUCOSE-CAPILLARY: 177 mg/dL — AB (ref 65–99)
Glucose-Capillary: 194 mg/dL — ABNORMAL HIGH (ref 65–99)
Glucose-Capillary: 135 mg/dL — ABNORMAL HIGH (ref 65–99)
Glucose-Capillary: 164 mg/dL — ABNORMAL HIGH (ref 65–99)
Glucose-Capillary: 181 mg/dL — ABNORMAL HIGH (ref 65–99)
Glucose-Capillary: 160 mg/dL — ABNORMAL HIGH (ref 65–99)
Glucose-Capillary: 153 mg/dL — ABNORMAL HIGH (ref 65–99)
Glucose-Capillary: 138 mg/dL — ABNORMAL HIGH (ref 65–99)
Glucose-Capillary: 167 mg/dL — ABNORMAL HIGH (ref 65–99)
Glucose-Capillary: 328 mg/dL — ABNORMAL HIGH (ref 65–99)

## 2015-11-10 LAB — URINALYSIS, ROUTINE W REFLEX MICROSCOPIC
BILIRUBIN URINE: NEGATIVE
Glucose, UA: NEGATIVE mg/dL
KETONES UR: 15 mg/dL — AB
NITRITE: NEGATIVE
Protein, ur: NEGATIVE mg/dL
Specific Gravity, Urine: 1.008 (ref 1.005–1.030)
pH: 6.5 (ref 5.0–8.0)

## 2015-11-10 LAB — COMPREHENSIVE METABOLIC PANEL
ALT: 12 U/L — ABNORMAL LOW (ref 14–54)
ANION GAP: 13 (ref 5–15)
AST: 14 U/L — AB (ref 15–41)
Albumin: 3.9 g/dL (ref 3.5–5.0)
Alkaline Phosphatase: 107 U/L (ref 38–126)
BUN: 9 mg/dL (ref 6–20)
CHLORIDE: 107 mmol/L (ref 101–111)
CO2: 16 mmol/L — ABNORMAL LOW (ref 22–32)
Calcium: 9.2 mg/dL (ref 8.9–10.3)
Creatinine, Ser: 0.59 mg/dL (ref 0.44–1.00)
Glucose, Bld: 183 mg/dL — ABNORMAL HIGH (ref 65–99)
POTASSIUM: 3.4 mmol/L — AB (ref 3.5–5.1)
Sodium: 136 mmol/L (ref 135–145)
Total Bilirubin: 1 mg/dL (ref 0.3–1.2)
Total Protein: 7.7 g/dL (ref 6.5–8.1)

## 2015-11-10 LAB — TROPONIN I: Troponin I: <0.03 ng/mL (ref <0.03)

## 2015-11-10 LAB — MAGNESIUM
MAGNESIUM: 1.7 mg/dL (ref 1.7–2.4)
Magnesium: 2.2 mg/dL (ref 1.7–2.4)

## 2015-11-10 LAB — TSH: TSH: 2.005 u[IU]/mL (ref 0.350–4.500)

## 2015-11-10 LAB — PREALBUMIN: Prealbumin: 27.2 mg/dL (ref 18–38)

## 2015-11-10 LAB — PHOSPHORUS: PHOSPHORUS: 3 mg/dL (ref 2.5–4.6)

## 2015-11-10 LAB — LACTIC ACID, PLASMA
LACTIC ACID, VENOUS: 1.3 mmol/L (ref 0.5–1.9)
Lactic Acid, Venous: 1.3 mmol/L (ref 0.5–1.9)

## 2015-11-10 MED ORDER — SODIUM BICARBONATE 8.4 % IV SOLN
50.0000 meq | Freq: Once | INTRAVENOUS | Status: AC
  Administered 2015-11-10: 50 meq via INTRAVENOUS
  Filled 2015-11-10: qty 50

## 2015-11-10 MED ORDER — INSULIN GLARGINE 100 UNIT/ML ~~LOC~~ SOLN
20.0000 [IU] | Freq: Once | SUBCUTANEOUS | Status: AC
  Administered 2015-11-10: 20 [IU] via SUBCUTANEOUS
  Filled 2015-11-10: qty 0.2

## 2015-11-10 MED ORDER — LACTATED RINGERS IV BOLUS (SEPSIS)
1000.0000 mL | Freq: Once | INTRAVENOUS | Status: AC
  Administered 2015-11-10: 1000 mL via INTRAVENOUS

## 2015-11-10 MED ORDER — POTASSIUM PHOSPHATES 15 MMOLE/5ML IV SOLN
20.0000 meq | Freq: Once | INTRAVENOUS | Status: AC
  Administered 2015-11-10: 20 meq via INTRAVENOUS
  Filled 2015-11-10: qty 4.55

## 2015-11-10 MED ORDER — FLUCONAZOLE IN SODIUM CHLORIDE 200-0.9 MG/100ML-% IV SOLN
200.0000 mg | Freq: Once | INTRAVENOUS | Status: AC
  Administered 2015-11-10: 200 mg via INTRAVENOUS
  Filled 2015-11-10 (×2): qty 100

## 2015-11-10 MED ORDER — KCL-LACTATED RINGERS 20 MEQ/L IV SOLN
INTRAVENOUS | Status: DC
  Filled 2015-11-10: qty 1000

## 2015-11-10 MED ORDER — MORPHINE SULFATE (PF) 2 MG/ML IV SOLN
2.0000 mg | INTRAVENOUS | Status: DC | PRN
  Administered 2015-11-10 (×3): 2 mg via INTRAVENOUS
  Filled 2015-11-10 (×3): qty 1

## 2015-11-10 MED ORDER — SODIUM BICARBONATE 8.4 % IV SOLN
INTRAVENOUS | Status: AC
  Filled 2015-11-10: qty 50

## 2015-11-10 MED ORDER — POTASSIUM CHLORIDE 2 MEQ/ML IV SOLN
INTRAVENOUS | Status: DC
  Administered 2015-11-10: 19:00:00 via INTRAVENOUS
  Filled 2015-11-10 (×2): qty 1000

## 2015-11-10 MED ORDER — INSULIN ASPART 100 UNIT/ML ~~LOC~~ SOLN
0.0000 [IU] | Freq: Every day | SUBCUTANEOUS | Status: DC
  Administered 2015-11-10: 4 [IU] via SUBCUTANEOUS

## 2015-11-10 MED ORDER — INSULIN ASPART 100 UNIT/ML ~~LOC~~ SOLN
0.0000 [IU] | Freq: Three times a day (TID) | SUBCUTANEOUS | Status: DC
  Administered 2015-11-10 – 2015-11-11 (×3): 1 [IU] via SUBCUTANEOUS

## 2015-11-10 MED ORDER — PROMETHAZINE HCL 25 MG/ML IJ SOLN
12.5000 mg | Freq: Three times a day (TID) | INTRAMUSCULAR | Status: DC | PRN
  Administered 2015-11-10: 12.5 mg via INTRAVENOUS
  Filled 2015-11-10: qty 1

## 2015-11-10 MED ORDER — SODIUM BICARBONATE 8.4 % IV SOLN
100.0000 meq | Freq: Once | INTRAVENOUS | Status: AC
  Administered 2015-11-10: 100 meq via INTRAVENOUS
  Filled 2015-11-10: qty 50

## 2015-11-10 MED ORDER — FLUCONAZOLE IN SODIUM CHLORIDE 100-0.9 MG/50ML-% IV SOLN
100.0000 mg | INTRAVENOUS | Status: DC
  Administered 2015-11-11 – 2015-11-12 (×2): 100 mg via INTRAVENOUS
  Filled 2015-11-10 (×2): qty 50

## 2015-11-10 MED ORDER — MAGNESIUM SULFATE 2 GM/50ML IV SOLN
2.0000 g | Freq: Once | INTRAVENOUS | Status: AC
  Administered 2015-11-10: 2 g via INTRAVENOUS
  Filled 2015-11-10: qty 50

## 2015-11-10 MED ORDER — SODIUM CHLORIDE 0.9 % IV SOLN
INTRAVENOUS | Status: DC
  Administered 2015-11-10: 23:00:00 via INTRAVENOUS

## 2015-11-10 MED ORDER — PROMETHAZINE HCL 25 MG/ML IJ SOLN
6.2500 mg | INTRAMUSCULAR | Status: DC | PRN
Start: 2015-11-10 — End: 2015-11-12
  Administered 2015-11-10 (×2): 6.25 mg via INTRAVENOUS
  Filled 2015-11-10 (×2): qty 1

## 2015-11-10 MED ORDER — POTASSIUM CHLORIDE 10 MEQ/100ML IV SOLN
10.0000 meq | INTRAVENOUS | Status: AC
  Administered 2015-11-10 (×4): 10 meq via INTRAVENOUS
  Filled 2015-11-10 (×4): qty 100

## 2015-11-10 MED ORDER — KCL IN DEXTROSE-NACL 20-5-0.45 MEQ/L-%-% IV SOLN
INTRAVENOUS | Status: DC
  Administered 2015-11-10: 11:00:00 via INTRAVENOUS
  Filled 2015-11-10: qty 1000

## 2015-11-10 MED ORDER — METOPROLOL TARTRATE 5 MG/5ML IV SOLN
2.5000 mg | Freq: Three times a day (TID) | INTRAVENOUS | Status: DC
  Administered 2015-11-10 – 2015-11-11 (×3): 2.5 mg via INTRAVENOUS
  Filled 2015-11-10 (×3): qty 5

## 2015-11-10 MED ORDER — INFLUENZA VAC SPLIT QUAD 0.5 ML IM SUSY
0.5000 mL | PREFILLED_SYRINGE | INTRAMUSCULAR | Status: AC
  Administered 2015-11-11: 0.5 mL via INTRAMUSCULAR
  Filled 2015-11-10: qty 0.5

## 2015-11-10 NOTE — Progress Notes (Addendum)
PROGRESS NOTE  [name] [id number] DOB: [birth year] DOA: 11/09/2015 PCP: Vicenta Aly, FNP  HPI/Recap of past 24 hours:  Remain intermittent vomiting Ab pain better, remain on insulin drip, npo No diarrhea today ( she report having intermittent diarrhea since her cholecystectomy)  She report has lost her insurance a few months ago when she turns to 25. Currently no pmd due to no insurance.  Assessment/Plan: Active Problems:   Asthma   DKA (diabetic ketoacidoses) (HCC)   Diabetic ketoacidosis without coma associated with type 1 diabetes mellitus (HCC)   Chronic diastolic heart failure (HCC)   Dehydration   Diabetic gastroparesis (H[facility])   Intractable nausea and vomiting   Protein-calorie malnutrition, severe   Diarrhea    DKA (diabetic ketoacidoses) ([place]) -  CXR unremarkable, ECG sinus tachycardia, negative cardiac enzymes, UA concentrated, + glucose, + ketone, + yeast.   admitted to stepdown per DKA protocol, insulin drip, IVF. Change IVF to D5 1/2Na after BG <250 . Bicarb supplement prn.   Intractable nausea and vomiting  with h/o this and recurrent admissions due to this. She has a diagnosis of diabetic gastroparesis, she also use marijuana.  supportive management with scheduled Reglan when necessary Zofran  Sinus tachycardia: likely stress and dehydration induced, patient has h/o of the same, heart rate improved with correction dehydration and dka. Continue monitor on tele. tsh wnl  Yeast in the urine, with recent h/o urinary retention, and uncontrolled diabetes, will treat with diflucan. urine is milky, repeat ua and urine culture  Hypokalemia: replace k, check mag  Type I diabetes, a1c pending, currently on insulin drip, diabetes RN input appreciated.  .h/o Asthma - lung clear, Chronic stable continue home medications  H/o Chronic diastolic heart failure ([place]) currently appears to be dehydrated , close monitor volume status  recent h/o  urinary  retention required foley, currently no problem, monitor    . Protein-calorie malnutrition, severe, nutrition consult  . Diarrhea: gastric panel in process.   c.diff not collected  + marijuana: education provided regarding cannbinoid hyperemesis.  2pm Addendum: Repeat bmp showed gap closed, blood sugar better controlled, will transition of insulin drip, start clears, continue cycle bmp q4hrs, if she remains having n/v, repeat bmp worsening, she may needs to be back on insulin drip again  Code Status: full  Family Communication: patient   Disposition Plan: remain in stepdown   Consultants:  none  Procedures:  none  Antibiotics:  none   Objective: BP (!) 160/91   Pulse (!) 128   Temp 98.7 F (37.1 C) (Oral)   Resp 20   Ht 5\' 4"  (1.626 m)   Wt 52.7 kg (116 lb 2.9 oz)   LMP 10/19/2015   SpO2 100%   BMI 19.94 kg/m   Intake/Output Summary (Last 24 hours) at 11/10/15 0810 Last data filed at 11/10/15 0700  Gross per 24 hour  Intake          3227.57 ml  Output              900 ml  Net          2327.57 ml   Filed Weights   11/09/15 2044  Weight: 52.7 kg (116 lb 2.9 oz)    Exam:   General:  NAD  Cardiovascular: sinus tachcyardia  Respiratory: CTABL  Abdomen: Soft/ND/NT, positive BS  Musculoskeletal: No Edema  Neuro: aaox3  Data Reviewed: Basic Metabolic Panel:  Recent Labs Lab 11/04/15 0345  11/05/15 0344  11/06/15 0457 11/09/15 1608 11/09/15 1623  11/09/15 2124 11/10/15 0058 11/10/15 0610  NA 138  < > 141  --  139 138 137 139 137 136  K 3.0*  < > 2.6*  < > 3.6 4.4 4.5 4.2 4.1 3.4*  CL 108  < > 113*  --  108 103 106 113* 111 107  CO2 22  < > 24  --  26 16*  --  13* 12* 16*  GLUCOSE 152*  < > 147*  --  191* 284* 287* 183* 163* 183*  BUN <5*  < > <5*  --  <5* 19 21* 13 11 9   CREATININE 0.47  < > 0.42*  --  0.37* 0.78 0.40* 0.63 0.60 0.59  CALCIUM 7.9*  < > 7.4*  --  8.3* 10.0  --  8.7* 8.9 9.2  MG 1.8  --  2.0  --   --   --   --  1.7  --   1.7  PHOS 1.4*  --  1.8*  --  2.3*  --   --  2.2*  --  3.0  < > = values in this interval not displayed. Liver Function Tests:  Recent Labs Lab 11/09/15 1608 11/10/15 0610  AST 27 14*  ALT 16 12*  ALKPHOS 129* 107  BILITOT 1.5* 1.0  PROT 9.3* 7.7  ALBUMIN 5.1* 3.9   No results for input(s): LIPASE, AMYLASE in the last 168 hours. No results for input(s): AMMONIA in the last 168 hours. CBC:  Recent Labs Lab 11/05/15 0344 11/09/15 1608 11/09/15 1623 11/10/15 0610  WBC 4.9 9.4  --  12.4*  NEUTROABS  --  8.2*  --   --   HGB 9.8* 14.1 15.6* 12.2  HCT 28.7* 42.0 46.0 36.5  MCV 89.1 91.1  --  90.8  PLT 268 512*  --  511*   Cardiac Enzymes:    Recent Labs Lab 11/09/15 2124 11/10/15 0058  TROPONINI <0.03 <0.03   BNP (last 3 results)  Recent Labs  04/11/15 0325 07/11/15 0353  BNP 43.5 6.9    ProBNP (last 3 results) No results for input(s): PROBNP in the last 8760 hours.  CBG:  Recent Labs Lab 11/10/15 0343 11/10/15 0459 11/10/15 0604 11/10/15 0701 11/10/15 0756  GLUCAP 135* 164* 181* 177* 161*    Recent Results (from the past 240 hour(s))  MRSA PCR Screening     Status: None   Collection Time: 11/03/15  3:06 AM  Result Value Ref Range Status   MRSA by PCR NEGATIVE NEGATIVE Final    Comment:        The GeneXpert MRSA Assay (FDA approved for NASAL specimens only), is one component of a comprehensive MRSA colonization surveillance program. It is not intended to diagnose MRSA infection nor to guide or monitor treatment for MRSA infections.      Studies: Portable Chest X-ray (1 View)  Result Date: 11/09/2015 CLINICAL DATA:  DKA type 1. H/o asthma, heart murmur, HTN. Smokes marijuana. EXAM: PORTABLE CHEST 1 VIEW COMPARISON:  06/09/2015 FINDINGS: The heart size and mediastinal contours are within normal limits. Both lungs are clear. The visualized skeletal structures are unremarkable. IMPRESSION: No active disease. Electronically Signed   By:  Nolon Nations M.D.   On: 11/09/2015 23:29    Scheduled Meds: . enoxaparin (LOVENOX) injection  40 mg Subcutaneous Q24H  . escitalopram  5 mg Oral Daily  . insulin regular  0-10 Units Intravenous TID WC  . mouth rinse  15 mL Mouth Rinse BID  .  metoCLOPramide (REGLAN) injection  5 mg Intravenous Q6H  . mometasone-formoterol  2 puff Inhalation BID  . montelukast  10 mg Oral Daily  . potassium phosphate IVPB (mEq)  20 mEq Intravenous Once  . sodium chloride flush  3 mL Intravenous Q12H    Continuous Infusions: . sodium chloride    . dextrose 5 % and 0.45% NaCl 125 mL/hr at 11/10/15 0510  . insulin (NOVOLIN-R) infusion 3.5 Units/hr (11/10/15 0700)     Time spent: 26mins  Aero Drummonds MD, PhD  [facility] Pager [phone]. If 7PM-7AM, please contact night-coverage at [email or web address], password [facility] 11/10/2015, 8:10 AM  LOS: 1 day

## 2015-11-10 NOTE — Progress Notes (Signed)
While emptying pt/s catheter it was noticed that there was a change in urine from yellow/straw and cloudy to milky white with a very slight yellow tinge. MD was notified of this color and order was added for sample to be sent down for further evaluation. Will continue to monitor.

## 2015-11-10 NOTE — Progress Notes (Signed)
Per MD call lab to see if C.diff sample can be added to GI panel that has already been collected. Per lab, sample has already been sent and therefore they are unable to add it. Since sample has already been sent, per MD await for next bowel movement and collect C.diff sample. Will continue to follow up.

## 2015-11-11 DIAGNOSIS — N390 Urinary tract infection, site not specified: Secondary | ICD-10-CM

## 2015-11-11 DIAGNOSIS — B3741 Candidal cystitis and urethritis: Secondary | ICD-10-CM

## 2015-11-11 DIAGNOSIS — R339 Retention of urine, unspecified: Secondary | ICD-10-CM

## 2015-11-11 DIAGNOSIS — R112 Nausea with vomiting, unspecified: Secondary | ICD-10-CM

## 2015-11-11 LAB — MAGNESIUM: MAGNESIUM: 1.9 mg/dL (ref 1.7–2.4)

## 2015-11-11 LAB — GLUCOSE, CAPILLARY
GLUCOSE-CAPILLARY: 103 mg/dL — AB (ref 65–99)
GLUCOSE-CAPILLARY: 150 mg/dL — AB (ref 65–99)
GLUCOSE-CAPILLARY: 195 mg/dL — AB (ref 65–99)
Glucose-Capillary: 145 mg/dL — ABNORMAL HIGH (ref 65–99)

## 2015-11-11 LAB — CBC
HCT: 34 % — ABNORMAL LOW (ref 36.0–46.0)
HEMOGLOBIN: 11.2 g/dL — AB (ref 12.0–15.0)
MCH: 29.9 pg (ref 26.0–34.0)
MCHC: 32.9 g/dL (ref 30.0–36.0)
MCV: 90.7 fL (ref 78.0–100.0)
PLATELETS: 428 10*3/uL — AB (ref 150–400)
RBC: 3.75 MIL/uL — AB (ref 3.87–5.11)
RDW: 13.8 % (ref 11.5–15.5)
WBC: 8.6 10*3/uL (ref 4.0–10.5)

## 2015-11-11 LAB — VOLATILES,BLD-ACETONE,ETHANOL,ISOPROP,METHANOL
ACETONE, BLOOD: 0.026 % — AB (ref 0.000–0.010)
Ethanol, blood: NEGATIVE % (ref 0.000–0.010)
Isopropanol, blood: NEGATIVE % (ref 0.000–0.010)
Methanol, blood: NEGATIVE % (ref 0.000–0.010)

## 2015-11-11 LAB — BASIC METABOLIC PANEL
Anion gap: 8 (ref 5–15)
CHLORIDE: 106 mmol/L (ref 101–111)
CO2: 22 mmol/L (ref 22–32)
CREATININE: 0.51 mg/dL (ref 0.44–1.00)
Calcium: 8.6 mg/dL — ABNORMAL LOW (ref 8.9–10.3)
Glucose, Bld: 201 mg/dL — ABNORMAL HIGH (ref 65–99)
POTASSIUM: 3.8 mmol/L (ref 3.5–5.1)
SODIUM: 136 mmol/L (ref 135–145)

## 2015-11-11 LAB — HEMOGLOBIN A1C
Hgb A1c MFr Bld: 10.3 % — ABNORMAL HIGH (ref 4.8–5.6)
Mean Plasma Glucose: 249 mg/dL

## 2015-11-11 LAB — LACTIC ACID, PLASMA
LACTIC ACID, VENOUS: 3.5 mmol/L — AB (ref 0.5–1.9)
Lactic Acid, Venous: 2.4 mmol/L (ref 0.5–1.9)

## 2015-11-11 MED ORDER — SODIUM CHLORIDE 0.9 % IV BOLUS (SEPSIS)
1000.0000 mL | Freq: Once | INTRAVENOUS | Status: AC
  Administered 2015-11-11: 1000 mL via INTRAVENOUS

## 2015-11-11 MED ORDER — INSULIN ASPART 100 UNIT/ML ~~LOC~~ SOLN
3.0000 [IU] | Freq: Three times a day (TID) | SUBCUTANEOUS | Status: DC
  Administered 2015-11-11: 3 [IU] via SUBCUTANEOUS

## 2015-11-11 MED ORDER — INSULIN GLARGINE 100 UNIT/ML ~~LOC~~ SOLN
10.0000 [IU] | Freq: Every day | SUBCUTANEOUS | Status: AC
  Administered 2015-11-11: 10 [IU] via SUBCUTANEOUS
  Filled 2015-11-11: qty 0.1

## 2015-11-11 MED ORDER — DEXTROSE 5 % IV SOLN
1.0000 g | INTRAVENOUS | Status: DC
  Administered 2015-11-11 – 2015-11-12 (×2): 1 g via INTRAVENOUS
  Filled 2015-11-11 (×2): qty 10

## 2015-11-11 MED ORDER — INSULIN GLARGINE 100 UNIT/ML ~~LOC~~ SOLN
20.0000 [IU] | Freq: Two times a day (BID) | SUBCUTANEOUS | Status: DC
  Administered 2015-11-11: 20 [IU] via SUBCUTANEOUS
  Filled 2015-11-11: qty 0.2

## 2015-11-11 NOTE — Progress Notes (Signed)
Inpatient Diabetes Program Recommendations  AACE/ADA: New Consensus Statement on Inpatient Glycemic Control (2015)  Target Ranges:  Prepandial:   less than 140 mg/dL      Peak postprandial:   less than 180 mg/dL (1-2 hours)      Critically ill patients:  140 - 180 mg/dL   Lab Results  Component Value Date   GLUCAP 103 (H) 11/11/2015   HGBA1C 10.3 (H) 11/10/2015    Review of Glycemic Control  Transitioned off insulin drip on 10/22 at 1600. Given Lantus 20 units prior to discontinuation of drip. Also received Lantus 20 units QHS. Morning blood sugar 103. On Lantus 20 units bid currently.   Inpatient Diabetes Program Recommendations:    Add Novolog 6 units tidwc for meal coverage insulin. Give after meal to help with gastroparesis. Lantus 5 units QHS (1 time order on 10/23 at 2200) Lantus 25 units QHS to begin on 10/24  Reduce Novolog correction to sensitive tidwc and hs, as pt is Type 1 and sensitive to Insulin.  Pt will need affordable insulin at discharge. Pt's MD (Dr. Hartford Poli) has been giving pt samples of Basaglar and Novolog.  Will follow closely. Discussed with MD and RN.  Thank you. Lorenda Peck, RD, LDN, CDE Inpatient Diabetes Coordinator [phone]

## 2015-11-11 NOTE — Progress Notes (Signed)
Initial Nutrition Assessment  DOCUMENTATION CODES:   Not applicable  INTERVENTION:  - Continue to encourage PO intakes. - Continue FLD and advance diet further as medically feasible. - RD will continue to monitor for additional nutrition-related needs.  NUTRITION DIAGNOSIS:   Altered nutrition lab value related to acute illness as evidenced by other (see comment) (lactic acid: 2.4 mmol/L).  GOAL:   Patient will meet greater than or equal to 90% of their needs  MONITOR:   PO intake, Diet advancement, Weight trends, Labs, I & O's  REASON FOR ASSESSMENT:   Consult Assessment of nutrition requirement/status  ASSESSMENT:   25 y.o. female with medical history significant of DM1, gastroparesis, HTN, pancreatitis, asthma, and anxiety. Presented with acute onset of intractable vomiting similar to prior presentations. Patient reports generalized abdominal pain nausea vomiting since this morning (10/21) this was associated diarrhea. She has had a few watery BM denies any antibiotics exposure no sick contacts. She report frequent bouts of diarrhea since she had cholecystectomy. She was discharged 4 days ago for the same presentation. Patient attempted to use Zofran and Phenergan and Reglan at home but did not help. She's been smoking marijuana on a regular basis. Patient denies noncompliance of insulin.  Pt seen for consult. BMI indicates normal weight. Pt was on CLD until diet advancement to FLD today at 0945. At time of visit, she had received lunch tray and was sipping on cream of potato soup. Pt denies N/V today and denies beginning to feel nauseated after any PO intakes. Pt states she has a good appetite. Notes indicate pt was recently d/c'ed from the hospital. She denies irregular highs or lows concerning CBGs during the short time she was home. Pt confirms that N/V began on day of admission and that last episode of vomiting was yesterday evening; flow sheet indicates at 2000 and that it  was bile-like.  Unable to perform physical assessment at this time per pt request as she was eating. Per chart review, CBW consistent with weight from May and mid-June 2017. Weight chart indicates pt gained 29 lbs from 07/10/15-11/06/15 and subsequently lost 18 lbs (13% body weight) from 11/06/15-11/09/15. Will continue to monitor weight trends during hospitalization as unsure of accuracy of this substantial weight change and weight from 11/06/15 is inconsistent with weight trends since May.  Medications reviewed; sliding scale Novolog, 3 units Novolog TID, 10 units Lantus at bedtime, 5 mg IV Reglan QID, PRN Zofran, 10 mEq IV KCl x4 runs yesterday, PRN IV Phenergan. Labs reviewed; CBG: 103 mg/dL this AM, BUN: <5 mg/dL, Ca: 8.6 mg/dL, lactic acid: 2.4 mmol/L and trending down.    Diet Order:  Diet full liquid Room service appropriate? Yes; Fluid consistency: Thin  Skin:  Reviewed, no issues  Last BM:  10/21  Height:   Ht Readings from Last 1 Encounters:  11/09/15 5\' 4"  (1.626 m)    Weight:   Wt Readings from Last 1 Encounters:  11/09/15 116 lb 2.9 oz (52.7 kg)    Ideal Body Weight:  54.54 kg  BMI:  Body mass index is 19.94 kg/m.  Estimated Nutritional Needs:   Kcal:  1315-1580 (25-30 kcal/kg)  Protein:  50-60 grams  Fluid:  >/= 1.6 L/day  EDUCATION NEEDS:   No education needs identified at this time    Jarome Matin, MS, RD, LDN Inpatient Clinical Dietitian Pager # [phone] After hours/weekend pager # [phone]

## 2015-11-11 NOTE — Progress Notes (Signed)
PROGRESS NOTE  [name] [id number] DOB: [birth year] DOA: 11/09/2015 PCP: Vicenta Aly, FNP  HPI/Recap of past 24 hours:  Patient had multiple episodes of vomiting during the day on 10/22, none since 10/22-23 night, doing better this am  No diarrhea, no bm since being admitted ( she report having intermittent diarrhea since her cholecystectomy)  Foley in for urinary retention  She report has lost her insurance a few months ago when she turns to 25. Currently no pmd due to no insurance.  Assessment/Plan: Active Problems:   Asthma   DKA (diabetic ketoacidoses) (HCC)   Diabetic ketoacidosis without coma associated with type 1 diabetes mellitus (HCC)   Chronic diastolic heart failure (HCC)   Dehydration   Diabetic gastroparesis (HCC)   Intractable nausea and vomiting   Protein-calorie malnutrition, severe   Diarrhea    DKA (diabetic ketoacidoses) ([place]) -  CXR unremarkable, ECG sinus tachycardia, negative cardiac enzymes, UA concentrated, + glucose, + ketone, + yeast.   admitted to stepdown per DKA protocol, insulin drip, IVF. Change IVF to D5 1/2Na after BG <250 . Bicarb supplement prn. Better off insulin drip, started diet, advance as tolerated   Intractable nausea and vomiting  with h/o this and recurrent admissions due to this. She has a diagnosis of diabetic gastroparesis, she also use marijuana.  supportive management with scheduled Reglan when necessary Zofran Better, advance diet  Sinus tachycardia: likely stress and dehydration induced, patient has h/o of the same, heart rate improved with correction dehydration and dka. Continue monitor on tele. tsh wnl She required two doses of lopressor on 10/22, heart rate better on 10/23.  Yeast  And bacteria in the urine, urinary retention  with recent h/o urinary retention, and uncontrolled diabetes, will treat with diflucan. urine is milky, repeat ua and urine culture, add rocephin Urine seems clearing up on  10/23, voiding trial  Hypokalemia: replace k, mag 1.9  Type I diabetes, a1c 10.3, off insulin drip, titrate subQ insulin, diabetes RN input appreciated.  .h/o Asthma - lung clear, Chronic stable continue home medications  H/o Chronic diastolic heart failure ([place]) currently appears to be dehydrated , close monitor volume status   . Protein-calorie malnutrition, severe, nutrition consult  . 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   Code Status: full  Family Communication: patient   Disposition Plan: likely able to transfer out of stepdown if able to tolerate diet advancement   Consultants:  none  Procedures:  none  Antibiotics:  Rocephin from 10/23   Objective: BP 117/78   Pulse 92   Temp 98.1 F (36.7 C) (Oral)   Resp 17   Ht 5\' 4"  (1.626 m)   Wt 52.7 kg (116 lb 2.9 oz)   LMP 10/19/2015   SpO2 100%   BMI 19.94 kg/m   Intake/Output Summary (Last 24 hours) at 11/11/15 0933 Last data filed at 11/11/15 [id number]  Gross per 24 hour  Intake          3822.61 ml  Output             4680 ml  Net          -857.39 ml   Filed Weights   11/09/15 2044  Weight: 52.7 kg (116 lb 2.9 oz)    Exam:   General:  NAD  Cardiovascular: sinus tachycardia has resolved  Respiratory: CTABL  Abdomen: Soft/ND/NT, positive BS  Musculoskeletal: No Edema  Neuro: aaox3  Data Reviewed: Basic Metabolic Panel:  Recent Labs Lab 11/05/15 0344  11/06/15 0457  11/09/15 2124  11/10/15 0610 11/10/15 0835 11/10/15 1314 11/10/15 1641 11/10/15 2026 11/11/15 0329  NA 141  --  139  < > 139  < > 136 135 137 138 137 136  K 2.6*  < > 3.6  < > 4.2  < > 3.4* 3.1* 3.4* 3.7 4.7 3.8  CL 113*  --  108  < > 113*  < > 107 109 107 108 106 106  CO2 24  --  26  < > 13*  < > 16* 17* 23 22 20* 22  GLUCOSE 147*  --  191*  < > 183*  < > 183* 161* 149* 136* 278* 201*  BUN <5*  --  <5*  < > 13  < > 9  9 6  <5* <5* <5*  CREATININE 0.42*  --  0.37*  < > 0.63  < > 0.59 0.46 0.48 0.47 0.51 0.51  CALCIUM 7.4*  --  8.3*  < > 8.7*  < > 9.2 8.5* 8.2* 8.6* 8.9 8.6*  MG 2.0  --   --   --  1.7  --  1.7  --  2.2  --   --  1.9  PHOS 1.8*  --  2.3*  --  2.2*  --  3.0  --   --   --   --   --   < > = values in this interval not displayed. Liver Function Tests:  Recent Labs Lab 11/09/15 1608 11/10/15 0610  AST 27 14*  ALT 16 12*  ALKPHOS 129* 107  BILITOT 1.5* 1.0  PROT 9.3* 7.7  ALBUMIN 5.1* 3.9   No results for input(s): LIPASE, AMYLASE in the last 168 hours. No results for input(s): AMMONIA in the last 168 hours. CBC:  Recent Labs Lab 11/05/15 0344 11/09/15 1608 11/09/15 1623 11/10/15 0610 11/11/15 0329  WBC 4.9 9.4  --  12.4* 8.6  NEUTROABS  --  8.2*  --   --   --   HGB 9.8* 14.1 15.6* 12.2 11.2*  HCT 28.7* 42.0 46.0 36.5 34.0*  MCV 89.1 91.1  --  90.8 90.7  PLT 268 512*  --  511* 428*   Cardiac Enzymes:    Recent Labs Lab 11/09/15 2124 11/10/15 0058 11/10/15 0835  TROPONINI <0.03 <0.03 <0.03   BNP (last 3 results)  Recent Labs  04/11/15 0325 07/11/15 0353  BNP 43.5 6.9    ProBNP (last 3 results) No results for input(s): PROBNP in the last 8760 hours.  CBG:  Recent Labs Lab 11/10/15 1359 11/10/15 1501 11/10/15 1601 11/10/15 2126 11/11/15 0823  GLUCAP 140* 137* 144* 328* 103*    Recent Results (from the past 240 hour(s))  MRSA PCR Screening     Status: None   Collection Time: 11/03/15  3:06 AM  Result Value Ref Range Status   MRSA by PCR NEGATIVE NEGATIVE Final    Comment:        The GeneXpert MRSA Assay (FDA approved for NASAL specimens only), is one component of a comprehensive MRSA colonization surveillance program. It is not intended to diagnose MRSA infection nor to guide or monitor treatment for MRSA infections.      Studies: Dg Abd 1 View  Result Date: 11/10/2015 CLINICAL DATA:  Nausea and vomiting today EXAM: ABDOMEN - 1 VIEW  COMPARISON:  None. FINDINGS: The bowel gas pattern is normal. No radio-opaque calculi or other significant  radiographic abnormality are seen. IMPRESSION: Negative. Electronically Signed   By: Lahoma Crocker M.D.   On: 11/10/2015 15:07    Scheduled Meds: . cefTRIAXone (ROCEPHIN)  IV  1 g Intravenous Q24H  . enoxaparin (LOVENOX) injection  40 mg Subcutaneous Q24H  . escitalopram  5 mg Oral Daily  . fluconazole (DIFLUCAN) IV  100 mg Intravenous Q24H  . Influenza vac split quadrivalent PF  0.5 mL Intramuscular Tomorrow-1000  . insulin aspart  0-5 Units Subcutaneous QHS  . insulin aspart  0-9 Units Subcutaneous TID WC  . insulin glargine  20 Units Subcutaneous BID  . mouth rinse  15 mL Mouth Rinse BID  . metoCLOPramide (REGLAN) injection  5 mg Intravenous Q6H  . mometasone-formoterol  2 puff Inhalation BID  . montelukast  10 mg Oral Daily  . sodium chloride flush  3 mL Intravenous Q12H    Continuous Infusions:     Time spent: 29mins  Maleko Greulich MD, PhD  [facility] Pager [phone]. If 7PM-7AM, please contact night-coverage at [email or web address], password [facility] 11/11/2015, 9:33 AM  LOS: 2 days

## 2015-11-11 NOTE — Progress Notes (Signed)
CRITICAL VALUE ALERT  Critical value received:  Lactic Acid 3.5  Date of notification:  11/11/2015  Time of notification:  [id number]  Critical value read back:Yes.    Nurse who received alert:  Luther Parody, RN  MD notified (1st page):  K. Schorr  Time of first page:  [phone]  MD notified (2nd page):  Time of second page:  Responding MD: Lamar Blinks  Time MD responded:  [phone]

## 2015-11-12 LAB — BASIC METABOLIC PANEL
Anion gap: 8 (ref 5–15)
CHLORIDE: 105 mmol/L (ref 101–111)
CO2: 28 mmol/L (ref 22–32)
CREATININE: 0.41 mg/dL — AB (ref 0.44–1.00)
Calcium: 8.9 mg/dL (ref 8.9–10.3)
Glucose, Bld: 147 mg/dL — ABNORMAL HIGH (ref 65–99)
POTASSIUM: 3.3 mmol/L — AB (ref 3.5–5.1)
SODIUM: 141 mmol/L (ref 135–145)

## 2015-11-12 LAB — URINE CULTURE: CULTURE: NO GROWTH

## 2015-11-12 LAB — CBC
HCT: 32.9 % — ABNORMAL LOW (ref 36.0–46.0)
HEMOGLOBIN: 10.8 g/dL — AB (ref 12.0–15.0)
MCH: 30.1 pg (ref 26.0–34.0)
MCHC: 32.8 g/dL (ref 30.0–36.0)
MCV: 91.6 fL (ref 78.0–100.0)
PLATELETS: 377 10*3/uL (ref 150–400)
RBC: 3.59 MIL/uL — AB (ref 3.87–5.11)
RDW: 13.8 % (ref 11.5–15.5)
WBC: 5.2 10*3/uL (ref 4.0–10.5)

## 2015-11-12 LAB — GLUCOSE, CAPILLARY
GLUCOSE-CAPILLARY: 417 mg/dL — AB (ref 65–99)
GLUCOSE-CAPILLARY: 239 mg/dL — AB (ref 65–99)
Glucose-Capillary: 69 mg/dL (ref 65–99)

## 2015-11-12 LAB — MAGNESIUM: MAGNESIUM: 1.7 mg/dL (ref 1.7–2.4)

## 2015-11-12 MED ORDER — CEPHALEXIN 500 MG PO CAPS: 500.0000 mg | ORAL_CAPSULE | Freq: Two times a day (BID) | ORAL | 0 refills | Status: DC

## 2015-11-12 MED ORDER — POTASSIUM CHLORIDE CRYS ER 20 MEQ PO TBCR
40.0000 meq | EXTENDED_RELEASE_TABLET | ORAL | Status: DC
  Administered 2015-11-12: 40 meq via ORAL
  Filled 2015-11-12: qty 2

## 2015-11-12 MED ORDER — MAGNESIUM SULFATE 2 GM/50ML IV SOLN
2.0000 g | Freq: Once | INTRAVENOUS | Status: AC
  Administered 2015-11-12: 2 g via INTRAVENOUS
  Filled 2015-11-12: qty 50

## 2015-11-12 MED ORDER — CEPHALEXIN 500 MG PO CAPS: 500.0000 mg | ORAL_CAPSULE | Freq: Two times a day (BID) | ORAL | 0 refills | Status: AC

## 2015-11-12 MED ORDER — FLUCONAZOLE 100 MG PO TABS: 100.0000 mg | ORAL_TABLET | Freq: Every day | ORAL | 0 refills | Status: AC

## 2015-11-12 MED ORDER — FLUCONAZOLE 100 MG PO TABS: 100.0000 mg | ORAL_TABLET | Freq: Every day | ORAL | 0 refills | Status: DC

## 2015-11-12 NOTE — Progress Notes (Signed)
Date: November 12, 2015 Discharge orders checked for needs. Information for visit to [facility] and wellness clinic given to patient with information to make appointment. Velva Harman, RN, BSN, [place]   [phone]

## 2015-11-12 NOTE — Progress Notes (Signed)
DC instructions reviewed with pt and all questions and concerns were addressed. Pt independent, VSS, no c/o pain, no apparent distress or discomfort at this time.

## 2015-11-12 NOTE — Discharge Summary (Addendum)
Discharge Summary  Teauna Pridgeon [id number] DOB: 1991-01-02  PCP: Vicenta Aly, FNP  Admit date: 11/09/2015 Discharge date: 11/12/2015  Time spent: <30mins  Recommendations for Outpatient Follow-up:  1. F/u with PMD within a week  for hospital discharge follow up, repeat cbc/bmp at follow up, continue work with your primary care doctor for diabetes control 2. pmd to follow up on final urine culture result  Discharge Diagnoses:  Active Hospital Problems   Diagnosis Date Noted  . Diarrhea 11/09/2015  . Protein-calorie malnutrition, severe 06/10/2015  . Intractable nausea and vomiting 05/28/2015  . Dehydration   . Diabetic gastroparesis ([place])   . Chronic diastolic heart failure ([place]) 04/11/2015  . DKA (diabetic ketoacidoses) ([place]) 03/22/2015  . Diabetic ketoacidosis without coma associated with type 1 diabetes mellitus ([place])   . Asthma 06/29/2012    Resolved Hospital Problems   Diagnosis Date Noted Date Resolved  No resolved problems to display.    Discharge Condition: stable  Diet recommendation: carb modified  Filed Weights   11/09/15 2044  Weight: 52.7 kg (116 lb 2.9 oz)    History of present illness:  PCP: Vicenta Aly, FNP   Outpatient Specialists: Kenn File unable to see since has no insurance Patient coming from:   home Lives   With family    Chief Complaint: Nausea vomiting diarrhea  HPI: [name] is a 25 y.o. female with medical history significant of DM1, gastroparesis, HTN, pancreatitis, asthma, and anxiety    Presented with acute onset of intractable vomiting similar to prior presentations. Patient reports generalized abdominal pain nausea vomiting since this morning this was associated diarrhea. She has had a few watery BM denies any antibiotics exposure no sick contacts. She report frequent bouts of diarrhea since she had cholecystectomy.   She was discharged 4 days ago for the same presentation. Patient attempted to  use Zofran and Phenergan and Reglan at home but did not help.  she's been smoking marijuana on a regular basis. Patient denies noncompliance of insulin no fevers or chills no chest pain or shortness of breath  she reports normal voiding.  She reports that the last vomitus was brown no bright red no coffee ground.    Regarding pertinent Chronic problems: She has recurrent admissions for DKA type 1 diabetes and gastroparesis as well as marijuana induced hyperemesis During her last admission her stay was complicated by bladder distention and mild bilateral hydronephrosis improved with foleycatheter placement. Gastroparesis in the past improved with Reglan and 19 medics A1c 9.2 from 05/2015. IN ER:  Temp (24hrs), Avg:97.5 F (36.4 C), Min:97.5 F (36.4 C), Max:97.5 F (36.4 C)     Initially heart rate up to 140 patient was found to be very uncomfortable she was given Reglan Zofran and Haldol is no significant improvement in nausea and patient was trying to be assisted to use the bathroom patient apparently has collapsed. She continues to be vomiting activity in emergency department  Bladder scan was done showing 999 mL  Heart rate 1:30 BP 149/99 glucose 330  Lactic acid 2.26 Sodium 137 potassium 5.5 creatinine 0.78 bicarbonate 16 albumin 5.1 WBC 9.4 hemoglobin 14.1 Anion gap 19  7.281/27/94  Following Medications were ordered in ER: Medications  dextrose 5 %-0.45 % sodium chloride infusion (not administered)  insulin regular bolus via infusion 0-10 Units (not administered)  insulin regular (NOVOLIN R,HUMULIN R) 250 Units in sodium chloride 0.9 % 250 mL (1 Units/mL) infusion (5.4 Units/hr Intravenous Rate/Dose Change 11/09/15 1854)  dextrose 50 % solution 25 mL (  not administered)  0.9 %  sodium chloride infusion (not administered)  promethazine (PHENERGAN) injection 25 mg (not administered)  sodium chloride 0.9 % bolus 1,000 mL (0 mLs Intravenous Stopped 11/09/15 1853)  sodium  chloride 0.9 % bolus 1,000 mL (0 mLs Intravenous Stopped 11/09/15 1728)  metoCLOPramide (REGLAN) injection 10 mg (10 mg Intravenous Given 11/09/15 1621)  ondansetron (ZOFRAN) injection 4 mg (4 mg Intravenous Given 11/09/15 1621)  haloperidol lactate (HALDOL) injection 2 mg (2 mg Intravenous Given 11/09/15 1747)     Hospitalist was called for admission for DKA in a setting of dehydration and urinary retention   Hospital Course:  Active Problems:   Asthma   DKA (diabetic ketoacidoses) ([place])   Diabetic ketoacidosis without coma associated with type 1 diabetes mellitus ([facility])   Chronic diastolic heart failure (H[facility])   Dehydration   Diabetic gastroparesis ([facility])   Intractable nausea and vomiting   Protein-calorie malnutrition, severe   Diarrhea   DKA (diabetic ketoacidoses) ([place]) -  CXR unremarkable, ECG sinus tachycardia, negative cardiac enzymes, UA concentrated, + glucose, + ketone, + yeast.   admitted to stepdown per DKA protocol, insulin drip, IVF. Change IVF to D5 1/2Na after BG <250 . Bicarb supplement prn. Better off insulin drip, started diet and tolerated diet advancement.  Intractable nausea and vomiting with h/o this and recurrent admissions due to this. She has a diagnosis of diabetic gastroparesis, she also use marijuana.  supportive management with scheduled Reglan when necessary Zofran Symptom resolved,  diet advanced  Sinus tachycardia: likely stress and dehydration induced, patient has h/o of the same, heart rate improved with correction dehydration and dka. Continue monitor on tele. tsh wnl She required two doses of lopressor on 10/22, heart rate better on 10/23. Heart rate normalized at discharge without rate control meds at discharge.  Yeast  And bacteria in the urine,  with urinary retention required foley catheter, and uncontrolled diabetes,  urine is milky,  urine culture pending at discharge She is treated with rocephin and diflucan Urine  clearing up on  10/23, able to void spontaneously at discharge She is discharged on keflex and diflucan to finish treatment.  Hypokalemia: replace k, mag 1.9  Type I diabetes, a1c 10.3, off insulin drip, titrate subQ insulin, diabetes RN input appreciated. She lost her insurance, she is getting free samples from her pmd office. She is also provided info regarding [employer] brand insulin   .h/o Asthma - lung clear, Chronic stable continue home medications  H/o Chronic diastolic heart failure (HCC)currently appears to be dehydrated , close monitor volume status   .Protein-calorie malnutrition,  Weight loss, lost 18 lbs (13% body weight) from 11/06/15-11/09/15.  nutrition consult Body mass index is 19.94 kg/m.   Marland KitchenDiarrhea: no diarrhea in the hospital, no stool sample collected,   + marijuana: education provided regarding cannbinoid hyperemesis.   Code Status: full  Family Communication: patient   Disposition Plan: d/c home   Consultants:  none  Procedures:  none  Antibiotics:  Rocephin from 10/23   Discharge Exam: BP (!) 128/98   Pulse 93   Temp 98.2 F (36.8 C) (Oral)   Resp 15   Ht 5\' 4"  (1.626 m)   Wt 52.7 kg (116 lb 2.9 oz)   LMP 10/19/2015   SpO2 97%   BMI 19.94 kg/m     General:  NAD  Cardiovascular: sinus tachycardia has resolved  Respiratory: CTABL  Abdomen: Soft/ND/NT, positive BS  Musculoskeletal: No Edema  Neuro: aaox3    Discharge Instructions  You were cared for by a hospitalist during your hospital stay. If you have any questions about your discharge medications or the care you received while you were in the hospital after you are discharged, you can call the unit and asked to speak with the hospitalist on call if the hospitalist that took care of you is not available. Once you are discharged, your primary care physician will handle any further medical issues. Please note that NO REFILLS for any discharge medications will be  authorized once you are discharged, as it is imperative that you return to your primary care physician (or establish a relationship with a primary care physician if you do not have one) for your aftercare needs so that they can reassess your need for medications and monitor your lab values.  Discharge Instructions    Diet Carb Modified    Complete by:  As directed    Increase activity slowly    Complete by:  As directed        Medication List    STOP taking these medications   omeprazole 40 MG capsule Commonly known as:  PRILOSEC   ondansetron 4 MG tablet Commonly known as:  ZOFRAN     TAKE these medications   albuterol 108 (90 Base) MCG/ACT inhaler Commonly known as:  PROVENTIL HFA;VENTOLIN HFA Inhale 2 puffs into the lungs every 6 (six) hours as needed for shortness of breath.   albuterol (2.5 MG/3ML) 0.083% nebulizer solution Commonly known as:  PROVENTIL Take 2.5 mg by nebulization every 6 (six) hours as needed for shortness of breath.   BASAGLAR KWIKPEN [place] Inject 24 Units into the skin at bedtime.   budesonide-formoterol 160-4.5 MCG/ACT inhaler Commonly known as:  SYMBICORT Inhale 2 puffs into the lungs daily.   cephALEXin 500 MG capsule Commonly known as:  KEFLEX Take 1 capsule (500 mg total) by mouth 2 (two) times daily.   fluconazole 100 MG tablet Commonly known as:  DIFLUCAN Take 1 tablet (100 mg total) by mouth daily.   insulin aspart 100 UNIT/ML FlexPen Commonly known as:  NOVOLOG FLEXPEN INJECT 0-20 UNITS INTO THE SKIN AS DIRECTED. SSI - CARB SCALE, 0-20 UNITS What changed:  how much to take  how to take this  when to take this  reasons to take this  additional instructions   LEXAPRO 10 MG tablet Generic drug:  escitalopram Take 5 mg by mouth daily.   metoCLOPramide 10 MG tablet Commonly known as:  REGLAN Take 1 tablet (10 mg total) by mouth 3 (three) times daily before meals.   montelukast 10 MG tablet Commonly known as:   SINGULAIR Take 10 mg by mouth daily.   promethazine 25 MG tablet Commonly known as:  PHENERGAN Take 1 tablet (25 mg total) by mouth every 6 (six) hours as needed for refractory nausea / vomiting. What changed:  Another medication with the same name was removed. Continue taking this medication, and follow the directions you see here.      Allergies  Allergen Reactions  . Peanut-Containing Drug Products Swelling    Swelling of mouth, lips  . Strawberry Extract Swelling    Swelling of mouth, lips  . Ultram [Tramadol] Itching   Follow-up [place] Follow up in 2 week(s).   Why:  hospital discharge follow up, please contact your pmd for final urine culture result. Contact information: [address] [place] [phone]  The results of significant diagnostics from this hospitalization (including imaging, microbiology, ancillary and laboratory) are listed below for reference.    Significant Diagnostic Studies: Ct Abdomen Pelvis Wo Contrast  Result Date: 11/02/2015 CLINICAL DATA:  25 y.o. female with medical history significant of DM1 since age 73 yo, gastroparesis, htn, pancreatitis, asthma, anxiety comes in with acute onset of intractable vomiting since 3pm earlier. Generalized abdominal pain with vomiting. Patient being referred for admission for gastroparesis with intractable vomiting and pain. : CT ABDOMEN AND PELVIS WITHOUT CONTRAST TECHNIQUE: Multidetector CT imaging of the abdomen and pelvis was performed following the standard protocol without IV contrast. COMPARISON:  CT abdomen dated 04/21/2015 and CT abdomen dated 11/25/2014. FINDINGS: Lower chest: No acute abnormality. Hepatobiliary: Characterization of the liver is limited without intravascular contrast. The 2 dominant lesions within the right liver lobe, better demonstrated on earlier contrast enhanced CT of 04/21/2015, are grossly  stable. The smaller hypodense lesion within the left liver lobe is also grossly stable. No new liver lesions identified. Patient is status post cholecystectomy. No bile duct dilatation appreciated. Pancreas: Unremarkable. No pancreatic ductal dilatation or surrounding inflammatory changes. Spleen: Normal in size without focal abnormality. Adrenals/Urinary Tract: Adrenal glands appear normal. Bladder is prominently distended. Mild bilateral hydronephrosis, right greater the left, likely related to the bladder distension. No renal, ureteral or bladder calculi identified. Stomach/Bowel: Bowel is normal in caliber. No bowel wall thickening or evidence of bowel wall inflammation seen. Appendix appears normal. Appendix is not seen. Vascular/Lymphatic: No significant vascular findings are present. No enlarged abdominal or pelvic lymph nodes. Reproductive: Unremarkable. Other: No free fluid or abscess collections seen. No free intraperitoneal air. Musculoskeletal: No osseous abnormality. Superficial soft tissues are unremarkable. IMPRESSION: 1. Fairly severe bladder distension, extending into the mid abdomen. Mild bilateral hydronephrosis, right greater than left, presumably related to the bladder distension. No perinephric inflammation. No renal, ureteral or bladder calculi. 2. Three liver lesions appear grossly stable compared to the earlier exams, better demonstrated on the earlier contrast enhanced CT and MRI studies. 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 Per the MRI report of 11/27/2014, a recommended six-month follow-up MRI was due in May of 2017. 3. Remainder of the abdomen and pelvis CT is unremarkable. No bowel obstruction or evidence of bowel wall inflammation seen. No free fluid. Electronically Signed   By: Franki Cabot M.D.   On: 11/02/2015 19:39   Dg Abd 1 View  Result Date: 11/10/2015 CLINICAL DATA:  Nausea and vomiting today EXAM: ABDOMEN - 1 VIEW COMPARISON:   None. FINDINGS: The bowel gas pattern is normal. No radio-opaque calculi or other significant radiographic abnormality are seen. IMPRESSION: Negative. Electronically Signed   By: Lahoma Crocker M.D.   On: 11/10/2015 15:07   Portable Chest X-ray (1 View)  Result Date: 11/09/2015 CLINICAL DATA:  DKA type 1. H/o asthma, heart murmur, HTN. Smokes marijuana. EXAM: PORTABLE CHEST 1 VIEW COMPARISON:  06/09/2015 FINDINGS: The heart size and mediastinal contours are within normal limits. Both lungs are clear. The visualized skeletal structures are unremarkable. IMPRESSION: No active disease. Electronically Signed   By: Nolon Nations M.D.   On: 11/09/2015 23:29   Dg Abd 2 Views  Result Date: 11/01/2015 CLINICAL DATA:  Vomiting. Nausea and diffuse abdominal pain for 1 day. EXAM: ABDOMEN - 2 VIEW COMPARISON:  Radiographs 07/05/2015.  CT 04/21/2015 FINDINGS: No dilated bowel loops to suggest obstruction. Small volume of stool in the descending colon. There is otherwise generalized paucity of  bowel gas. This is unchanged from scout tomogram from CT which demonstrated normal bowel. No free air. No radiopaque calculi. Calcifications in the pelvis are phleboliths. The lung bases are clear. There is no osseous abnormality. IMPRESSION: Small stool burden.  No bowel dilatation to suggest obstruction. Electronically Signed   By: Jeb Levering M.D.   On: 11/01/2015 22:18    Microbiology: Recent Results (from the past 240 hour(s))  MRSA PCR Screening     Status: None   Collection Time: 11/03/15  3:06 AM  Result Value Ref Range Status   MRSA by PCR NEGATIVE NEGATIVE Final    Comment:        The GeneXpert MRSA Assay (FDA approved for NASAL specimens only), is one component of a comprehensive MRSA colonization surveillance program. It is not intended to diagnose MRSA infection nor to guide or monitor treatment for MRSA infections.   Culture, blood (Routine X 2) w Reflex to ID Panel     Status: None  (Preliminary result)   Collection Time: 11/10/15  2:04 AM  Result Value Ref Range Status   Specimen Description BLOOD BLOOD LEFT HAND  Final   Special Requests IN PEDIATRIC BOTTLE 2ML  Final   Culture   Final    NO GROWTH 1 DAY Performed at [facility]    Report Status PENDING  Incomplete  Culture, blood (Routine X 2) w Reflex to ID Panel     Status: None (Preliminary result)   Collection Time: 11/10/15  6:10 AM  Result Value Ref Range Status   Specimen Description BLOOD LEFT ANTECUBITAL  Final   Special Requests IN PEDIATRIC BOTTLE [place]  Final   Culture   Final    NO GROWTH 1 DAY Performed at [facility]    Report Status PENDING  Incomplete     Labs: Basic Metabolic Panel:  Recent Labs Lab 11/06/15 0457  11/09/15 2124  11/10/15 0610  11/10/15 1314 11/10/15 1641 11/10/15 2026 11/11/15 0329 11/12/15 0327  NA 139  < > 139  < > 136  < > 137 138 137 136 141  K 3.6  < > 4.2  < > 3.4*  < > 3.4* 3.7 4.7 3.8 3.3*  CL 108  < > 113*  < > 107  < > 107 108 106 106 105  CO2 26  < > 13*  < > 16*  < > 23 22 20* 22 28  GLUCOSE 191*  < > 183*  < > 183*  < > 149* 136* 278* 201* 147*  BUN <5*  < > 13  < > 9  < > 6 <5* <5* <5* <5*  CREATININE 0.37*  < > 0.63  < > 0.59  < > 0.48 0.47 0.51 0.51 0.41*  CALCIUM 8.3*  < > 8.7*  < > 9.2  < > 8.2* 8.6* 8.9 8.6* 8.9  MG  --   --  1.7  --  1.7  --  2.2  --   --  1.9 1.7  PHOS 2.3*  --  2.2*  --  3.0  --   --   --   --   --   --   < > = values in this interval not displayed. Liver Function Tests:  Recent Labs Lab 11/09/15 1608 11/10/15 0610  AST 27 14*  ALT 16 12*  ALKPHOS 129* 107  BILITOT 1.5* 1.0  PROT 9.3* 7.7  ALBUMIN 5.1* 3.9   No results for input(s): LIPASE,  AMYLASE in the last 168 hours. No results for input(s): AMMONIA in the last 168 hours. CBC:  Recent Labs Lab 11/09/15 1608 11/09/15 1623 11/10/15 0610 11/11/15 0329 11/12/15 0327  WBC 9.4  --  12.4* 8.6 5.2  NEUTROABS 8.2*  --   --   --   --   HGB  14.1 15.6* 12.2 11.2* 10.8*  HCT 42.0 46.0 36.5 34.0* 32.9*  MCV 91.1  --  90.8 90.7 91.6  PLT 512*  --  511* 428* 377   Cardiac Enzymes:  Recent Labs Lab 11/09/15 2124 11/10/15 0058 11/10/15 0835  TROPONINI <0.03 <0.03 <0.03   BNP: BNP (last 3 results)  Recent Labs  04/11/15 0325 07/11/15 0353  BNP 43.5 6.9    ProBNP (last 3 results) No results for input(s): PROBNP in the last 8760 hours.  CBG:  Recent Labs Lab 11/11/15 0823 11/11/15 1227 11/11/15 1652 11/11/15 2127 11/12/15 0814  GLUCAP 103* 150* 145* 195* 69       Signed:  Rozetta Stumpp MD, PhD  [facility] 11/12/2015, 9:28 AM

## 2015-11-15 LAB — CULTURE, BLOOD (ROUTINE X 2)
CULTURE: NO GROWTH
Culture: NO GROWTH

## 2015-11-18 ENCOUNTER — Inpatient Hospital Stay (HOSPITAL_COMMUNITY)
Admission: EM | Admit: 2015-11-18 | Discharge: 2015-11-23 | DRG: 639 | Disposition: A | Discharge status: Home / Self Care | Payer: Medicaid Other | Attending: Internal Medicine | Admitting: Internal Medicine

## 2015-11-18 ENCOUNTER — Encounter (HOSPITAL_COMMUNITY): Payer: Self-pay | Admitting: Emergency Medicine

## 2015-11-18 DIAGNOSIS — R1110 Vomiting, unspecified: Secondary | ICD-10-CM

## 2015-11-18 DIAGNOSIS — I10 Essential (primary) hypertension: Secondary | ICD-10-CM | POA: Diagnosis present

## 2015-11-18 DIAGNOSIS — K3184 Gastroparesis: Secondary | ICD-10-CM | POA: Diagnosis present

## 2015-11-18 DIAGNOSIS — Z833 Family history of diabetes mellitus: Secondary | ICD-10-CM | POA: Diagnosis not present

## 2015-11-18 DIAGNOSIS — R112 Nausea with vomiting, unspecified: Secondary | ICD-10-CM

## 2015-11-18 DIAGNOSIS — F1290 Cannabis use, unspecified, uncomplicated: Secondary | ICD-10-CM | POA: Diagnosis present

## 2015-11-18 DIAGNOSIS — Z794 Long term (current) use of insulin: Secondary | ICD-10-CM | POA: Diagnosis not present

## 2015-11-18 DIAGNOSIS — E1010 Type 1 diabetes mellitus with ketoacidosis without coma: Principal | ICD-10-CM | POA: Diagnosis present

## 2015-11-18 DIAGNOSIS — E876 Hypokalemia: Secondary | ICD-10-CM | POA: Diagnosis not present

## 2015-11-18 DIAGNOSIS — Z8249 Family history of ischemic heart disease and other diseases of the circulatory system: Secondary | ICD-10-CM

## 2015-11-18 DIAGNOSIS — Z9119 Patient's noncompliance with other medical treatment and regimen: Secondary | ICD-10-CM | POA: Diagnosis not present

## 2015-11-18 DIAGNOSIS — E1143 Type 2 diabetes mellitus with diabetic autonomic (poly)neuropathy: Secondary | ICD-10-CM | POA: Diagnosis present

## 2015-11-18 DIAGNOSIS — K920 Hematemesis: Secondary | ICD-10-CM | POA: Diagnosis present

## 2015-11-18 DIAGNOSIS — E1043 Type 1 diabetes mellitus with diabetic autonomic (poly)neuropathy: Secondary | ICD-10-CM | POA: Diagnosis present

## 2015-11-18 DIAGNOSIS — E869 Volume depletion, unspecified: Secondary | ICD-10-CM | POA: Diagnosis present

## 2015-11-18 DIAGNOSIS — E1110 Type 2 diabetes mellitus with ketoacidosis without coma: Secondary | ICD-10-CM | POA: Diagnosis present

## 2015-11-18 DIAGNOSIS — E1310 Other specified diabetes mellitus with ketoacidosis without coma: Secondary | ICD-10-CM

## 2015-11-18 LAB — URINE MICROSCOPIC-ADD ON

## 2015-11-18 LAB — CBC
HEMATOCRIT: 41.9 % (ref 36.0–46.0)
HEMOGLOBIN: 13.6 g/dL (ref 12.0–15.0)
MCH: 30.2 pg (ref 26.0–34.0)
MCHC: 32.5 g/dL (ref 30.0–36.0)
MCV: 92.9 fL (ref 78.0–100.0)
Platelets: 449 10*3/uL — ABNORMAL HIGH (ref 150–400)
RBC: 4.51 MIL/uL (ref 3.87–5.11)
RDW: 13.6 % (ref 11.5–15.5)
WBC: 9 10*3/uL (ref 4.0–10.5)

## 2015-11-18 LAB — CBG MONITORING, ED
GLUCOSE-CAPILLARY: 446 mg/dL — AB (ref 65–99)
GLUCOSE-CAPILLARY: 380 mg/dL — AB (ref 65–99)
GLUCOSE-CAPILLARY: 278 mg/dL — AB (ref 65–99)
GLUCOSE-CAPILLARY: 139 mg/dL — AB (ref 65–99)

## 2015-11-18 LAB — BASIC METABOLIC PANEL
ANION GAP: 15 (ref 5–15)
BUN: 18 mg/dL (ref 6–20)
CHLORIDE: 113 mmol/L — AB (ref 101–111)
CO2: 11 mmol/L — AB (ref 22–32)
Calcium: 8.9 mg/dL (ref 8.9–10.3)
Creatinine, Ser: 0.73 mg/dL (ref 0.44–1.00)
GFR calc non Af Amer: >60 mL/min (ref >60)
GLUCOSE: 229 mg/dL — AB (ref 65–99)
Potassium: 4.5 mmol/L (ref 3.5–5.1)
Sodium: 139 mmol/L (ref 135–145)

## 2015-11-18 LAB — COMPREHENSIVE METABOLIC PANEL
ALBUMIN: 4.5 g/dL (ref 3.5–5.0)
ALT: 15 U/L (ref 14–54)
ANION GAP: 19 — AB (ref 5–15)
AST: 24 U/L (ref 15–41)
Alkaline Phosphatase: 109 U/L (ref 38–126)
BILIRUBIN TOTAL: 1.8 mg/dL — AB (ref 0.3–1.2)
BUN: 20 mg/dL (ref 6–20)
CO2: 9 mmol/L — ABNORMAL LOW (ref 22–32)
Calcium: 8.9 mg/dL (ref 8.9–10.3)
Chloride: 106 mmol/L (ref 101–111)
Creatinine, Ser: 0.92 mg/dL (ref 0.44–1.00)
GFR calc non Af Amer: >60 mL/min (ref >60)
GLUCOSE: 482 mg/dL — AB (ref 65–99)
POTASSIUM: 4.7 mmol/L (ref 3.5–5.1)
SODIUM: 134 mmol/L — AB (ref 135–145)
TOTAL PROTEIN: 8.4 g/dL — AB (ref 6.5–8.1)

## 2015-11-18 LAB — URINALYSIS, ROUTINE W REFLEX MICROSCOPIC
Bilirubin Urine: NEGATIVE
Ketones, ur: >80 mg/dL — AB
LEUKOCYTES UA: NEGATIVE
NITRITE: NEGATIVE
PH: 6 (ref 5.0–8.0)
Protein, ur: NEGATIVE mg/dL
SPECIFIC GRAVITY, URINE: 1.026 (ref 1.005–1.030)

## 2015-11-18 LAB — BLOOD GAS, VENOUS
ACID-BASE DEFICIT: 18.4 mmol/L — AB (ref 0.0–2.0)
Bicarbonate: 8.9 mmol/L — ABNORMAL LOW (ref 20.0–28.0)
O2 SAT: 80.2 %
PATIENT TEMPERATURE: 98.6
PCO2 VEN: 24.9 mmHg — AB (ref 44.0–60.0)
pH, Ven: 7.176 — CL (ref 7.250–7.430)
pO2, Ven: 58.2 mmHg — ABNORMAL HIGH (ref 32.0–45.0)

## 2015-11-18 LAB — TYPE AND SCREEN
ABO/RH(D): O POS
ANTIBODY SCREEN: NEGATIVE

## 2015-11-18 LAB — TROPONIN I

## 2015-11-18 LAB — GLUCOSE, CAPILLARY
Glucose-Capillary: 219 mg/dL — ABNORMAL HIGH (ref 65–99)
Glucose-Capillary: 231 mg/dL — ABNORMAL HIGH (ref 65–99)

## 2015-11-18 LAB — LACTIC ACID, PLASMA
LACTIC ACID, VENOUS: 1.2 mmol/L (ref 0.5–1.9)
LACTIC ACID, VENOUS: 2.4 mmol/L — AB (ref 0.5–1.9)

## 2015-11-18 LAB — LIPASE, BLOOD: LIPASE: 15 U/L (ref 11–51)

## 2015-11-18 LAB — POC OCCULT BLOOD, ED: FECAL OCCULT BLD: NEGATIVE

## 2015-11-18 MED ORDER — DEXTROSE-NACL 5-0.45 % IV SOLN
INTRAVENOUS | Status: DC
  Administered 2015-11-19 – 2015-11-20 (×3): via INTRAVENOUS

## 2015-11-18 MED ORDER — INSULIN REGULAR BOLUS VIA INFUSION
0.0000 [IU] | Freq: Three times a day (TID) | INTRAVENOUS | Status: DC
  Filled 2015-11-18: qty 10

## 2015-11-18 MED ORDER — ONDANSETRON HCL 4 MG PO TABS: 4.0000 mg | ORAL_TABLET | Freq: Four times a day (QID) | ORAL | Status: DC | PRN

## 2015-11-18 MED ORDER — METOCLOPRAMIDE HCL 5 MG/ML IJ SOLN
10.0000 mg | Freq: Four times a day (QID) | INTRAMUSCULAR | Status: DC
  Administered 2015-11-18 – 2015-11-23 (×18): 10 mg via INTRAVENOUS
  Filled 2015-11-18 (×18): qty 2

## 2015-11-18 MED ORDER — ACETAMINOPHEN 325 MG PO TABS: 650.0000 mg | ORAL_TABLET | Freq: Four times a day (QID) | ORAL | Status: DC | PRN

## 2015-11-18 MED ORDER — MORPHINE SULFATE (PF) 2 MG/ML IV SOLN
1.0000 mg | INTRAVENOUS | Status: DC | PRN
  Administered 2015-11-18 – 2015-11-20 (×10): 1 mg via INTRAVENOUS
  Filled 2015-11-18 (×10): qty 1

## 2015-11-18 MED ORDER — ONDANSETRON HCL 4 MG/2ML IJ SOLN
4.0000 mg | Freq: Once | INTRAMUSCULAR | Status: AC
  Administered 2015-11-18: 4 mg via INTRAVENOUS
  Filled 2015-11-18: qty 2

## 2015-11-18 MED ORDER — DEXTROSE 50 % IV SOLN: 25.0000 mL | INTRAVENOUS | Status: DC | PRN

## 2015-11-18 MED ORDER — PANTOPRAZOLE SODIUM 40 MG IV SOLR
40.0000 mg | Freq: Two times a day (BID) | INTRAVENOUS | Status: DC
  Administered 2015-11-18 – 2015-11-22 (×9): 40 mg via INTRAVENOUS
  Filled 2015-11-18 (×9): qty 40

## 2015-11-18 MED ORDER — DEXTROSE-NACL 5-0.45 % IV SOLN
INTRAVENOUS | Status: DC
  Administered 2015-11-18: 22:00:00 via INTRAVENOUS

## 2015-11-18 MED ORDER — ORAL CARE MOUTH RINSE
15.0000 mL | Freq: Two times a day (BID) | OROMUCOSAL | Status: DC
  Administered 2015-11-19 – 2015-11-22 (×3): 15 mL via OROMUCOSAL

## 2015-11-18 MED ORDER — SODIUM CHLORIDE 0.9 % IV SOLN: INTRAVENOUS | Status: DC | PRN

## 2015-11-18 MED ORDER — ONDANSETRON HCL 4 MG/2ML IJ SOLN
4.0000 mg | Freq: Four times a day (QID) | INTRAMUSCULAR | Status: DC | PRN
  Administered 2015-11-18 – 2015-11-22 (×11): 4 mg via INTRAVENOUS
  Filled 2015-11-18 (×13): qty 2

## 2015-11-18 MED ORDER — SODIUM CHLORIDE 0.9 % IV BOLUS (SEPSIS)
1000.0000 mL | Freq: Once | INTRAVENOUS | Status: AC
  Administered 2015-11-18: 1000 mL via INTRAVENOUS

## 2015-11-18 MED ORDER — ACETAMINOPHEN 650 MG RE SUPP: 650.0000 mg | Freq: Four times a day (QID) | RECTAL | Status: DC | PRN

## 2015-11-18 MED ORDER — SODIUM CHLORIDE 0.9 % IV SOLN: INTRAVENOUS | Status: DC

## 2015-11-18 MED ORDER — SODIUM CHLORIDE 0.9 % IV SOLN
INTRAVENOUS | Status: DC
  Administered 2015-11-18: 19:00:00 via INTRAVENOUS

## 2015-11-18 MED ORDER — SODIUM CHLORIDE 0.9 % IV SOLN
INTRAVENOUS | Status: DC
  Administered 2015-11-18: 3.2 [IU]/h via INTRAVENOUS
  Filled 2015-11-18: qty 2.5

## 2015-11-18 MED ORDER — MORPHINE SULFATE (PF) 2 MG/ML IV SOLN
2.0000 mg | Freq: Once | INTRAVENOUS | Status: AC
  Administered 2015-11-18: 2 mg via INTRAVENOUS
  Filled 2015-11-18: qty 1

## 2015-11-18 MED ORDER — SODIUM CHLORIDE 0.9 % IV SOLN
INTRAVENOUS | Status: DC
  Administered 2015-11-20: 2.8 [IU]/h via INTRAVENOUS
  Administered 2015-11-20: 0.5 [IU]/h via INTRAVENOUS
  Administered 2015-11-20: 1.8 [IU]/h via INTRAVENOUS
  Filled 2015-11-18: qty 2.5

## 2015-11-18 MED ORDER — INSULIN ASPART 100 UNIT/ML ~~LOC~~ SOLN: 0.0000 [IU] | SUBCUTANEOUS | Status: DC

## 2015-11-18 MED ORDER — MOMETASONE FURO-FORMOTEROL FUM 200-5 MCG/ACT IN AERO
2.0000 | INHALATION_SPRAY | Freq: Two times a day (BID) | RESPIRATORY_TRACT | Status: DC
  Administered 2015-11-19: 2 via RESPIRATORY_TRACT
  Filled 2015-11-18: qty 8.8

## 2015-11-18 MED ORDER — SODIUM CHLORIDE 0.9% FLUSH
3.0000 mL | Freq: Two times a day (BID) | INTRAVENOUS | Status: DC
  Administered 2015-11-18 – 2015-11-19 (×3): 3 mL via INTRAVENOUS

## 2015-11-18 MED ORDER — INSULIN ASPART 100 UNIT/ML ~~LOC~~ SOLN
10.0000 [IU] | Freq: Once | SUBCUTANEOUS | Status: AC
  Administered 2015-11-18: 10 [IU] via INTRAVENOUS
  Filled 2015-11-18: qty 1

## 2015-11-18 NOTE — H&P (Signed)
[facility] History and Physical  Tamir Suski [id number] DOB: 04-Aug-1990 DOA: 11/18/2015   PCP: Vicenta Aly, FNP  Specialists: Followed by an endocrinologist at [facility] however, patient has not seen this provider in many months due to financial issues.  Chief Complaint: Nausea, vomiting, since this morning  HPI: [name] is a 25 y.o. female with a past medical history of poorly controlled type 1 diabetes, severe diabetic gastroparesis who has been hospitalized numerous times in the past few months for recurrent episodes of DKA. Patient was last discharged from this facility on October 24. She tells me that she was taking her insulin regularly. She denied any problems up until this morning when she started having nausea and vomiting. She also had abdominal discomfort and pain. On 2 occasions she had episodes of blood in the emesis, which she described as small quantity. Since then she hasn't noticed any blood in the emesis. She denies any diarrhea. Denies any blood in the stool. No black stools. No urinary complaints. Denies any fever or chills. No cough. She took some of her home medications without any relief and so decided to come into the emergency department.  In the ED, patient was found to be in diabetic ketoacidosis. She will require hospitalization for further management.  Home Medications: Prior to Admission medications   Medication Sig Start Date End Date Taking? Authorizing Provider  albuterol (PROVENTIL HFA;VENTOLIN HFA) 108 (90 BASE) MCG/ACT inhaler Inhale 2 puffs into the lungs every 6 (six) hours as needed for shortness of breath.   Yes Historical Provider, MD  albuterol (PROVENTIL) (2.5 MG/3ML) 0.083% nebulizer solution Take 2.5 mg by nebulization every 6 (six) hours as needed for shortness of breath.   Yes Historical Provider, MD  budesonide-formoterol (SYMBICORT) 160-4.5 MCG/ACT inhaler Inhale 2 puffs into the lungs daily.    Yes Historical  Provider, MD  escitalopram (LEXAPRO) 10 MG tablet Take 5 mg by mouth daily. 06/20/15 06/19/16 Yes Historical Provider, MD  insulin aspart (NOVOLOG FLEXPEN) 100 UNIT/ML FlexPen INJECT 0-20 UNITS INTO THE SKIN AS DIRECTED. SSI - CARB SCALE, 0-20 UNITS Patient taking differently: Inject 0-20 Units into the skin 4 (four) times daily as needed for high blood sugar. Sliding scale 10/19/13  Yes Elayne Snare, MD  Insulin Glargine (BASAGLAR KWIKPEN [facility] M. [facility]) Inject 24 Units into the skin at bedtime.   Yes Historical Provider, MD  metoCLOPramide (REGLAN) 10 MG tablet Take 1 tablet (10 mg total) by mouth 3 (three) times daily before meals. 11/06/15  Yes Janece Canterbury, MD  montelukast (SINGULAIR) 10 MG tablet Take 10 mg by mouth daily.   Yes Historical Provider, MD  promethazine (PHENERGAN) 25 MG tablet Take 1 tablet (25 mg total) by mouth every 6 (six) hours as needed for refractory nausea / vomiting. 11/06/15  Yes Janece Canterbury, MD    Allergies:  Allergies  Allergen Reactions  . Peanut-Containing Drug Products Swelling    Swelling of mouth, lips  . Strawberry Extract Swelling    Swelling of mouth, lips  . Ultram [Tramadol] Itching    Past Medical History: Past Medical History:  Diagnosis Date  . Anxiety   . Arthritis   . Asthma   . Diabetes mellitus 2007   IDDM.  poorly controlled, multiple admits with DKA  . Gallstones   . Heart murmur   . Hepatic steatosis 11/26/2014   and hepatomegaly  . Hypertension    NOT CURRENTLY ON ANY BP MED  . Liver mass 11/26/2014  . Pancreatitis, acute 11/26/2014  Past Surgical History:  Procedure Laterality Date  . CHOLECYSTECTOMY N/A 02/11/2015   Procedure: LAPAROSCOPIC CHOLECYSTECTOMY WITH INTRAOPERATIVE CHOLANGIOGRAM;  Surgeon: Greer Pickerel, MD;  Location: [facility];  Service: General;  Laterality: N/A;  . ESOPHAGOGASTRODUODENOSCOPY (EGD) WITH PROPOFOL Left 09/20/2014   Procedure: ESOPHAGOGASTRODUODENOSCOPY (EGD) WITH PROPOFOL;  Surgeon: Arta Silence, MD;  Location:  [facility] ENDOSCOPY;  Service: Endoscopy;  Laterality: Left;  . WISDOM TOOTH EXTRACTION      Social History: Denies any history of smoking or alcohol consumption. She does report history of marijuana use. Independent with activities.  Family History:  Family History  Problem Relation Age of Onset  . Heart disease Maternal Grandmother   . Heart disease Maternal Grandfather   . Diabetes Mother   . Hyperlipidemia Mother   . Hypertension Father   . Heart disease Father   . Hypertension Paternal Grandmother   . Cancer Paternal Grandfather      Review of Systems - Patient not cooperating with this due to significant discomfort.  Physical Examination  Vitals:   11/18/15 1925 11/18/15 1930 11/18/15 2000 11/18/15 2050  BP: 150/90 155/96 (!) 150/103 (!) 146/112  Pulse: (!) 127 (!) 136 (!) 137 (!) 136  Resp: 20 22 18 16   Temp:      TempSrc:      SpO2: 100% 100% 100% 100%  Weight:      Height:        BP (!) 146/112   Pulse (!) 136   Temp 97.7 F (36.5 C) (Oral)   Resp 16   Ht 5\' 3"  (1.6 m)   Wt 54.4 kg (120 lb)   LMP 10/19/2015   SpO2 100%   BMI 21.26 kg/m   General appearance: alert, cooperative, appears stated age and no distress. Noted to be in discomfort. Head: Normocephalic, without obvious abnormality, atraumatic Eyes: conjunctivae/corneas clear. PERRL, EOM's intact. Throat: Extremely dry mucous membranes. Neck: no adenopathy, no carotid bruit, no JVD, supple, symmetrical, trachea midline and thyroid not enlarged, symmetric, no tenderness/mass/nodules Back: symmetric, no curvature. ROM normal. No CVA tenderness. Resp: clear to auscultation bilaterally Cardio: S1, S2 is tachycardic. Regular. No S3, S4. No rubs, murmurs, or bruit. No pedal edema. GI: Abdomen is soft. Nontender. Nondistended. Bowel sounds present. No masses or organomegaly. Extremities: extremities normal, atraumatic, no cyanosis or edema Pulses: 2+ and symmetric Skin: Skin color, texture, turgor normal. No  rashes or lesions Lymph nodes: Cervical, supraclavicular, and axillary nodes normal. Neurologic: Awake and alert. No obvious focal neurological deficits noted.   Labs on Admission: I have personally reviewed following labs and imaging studies  CBC:  Recent Labs Lab 11/12/15 0327 11/18/15 1853  WBC 5.2 9.0  HGB 10.8* 13.6  HCT 32.9* 41.9  MCV 91.6 92.9  PLT 377 [id number]*   Basic Metabolic Panel:  Recent Labs Lab 11/12/15 0327 11/18/15 1853  NA 141 134*  K 3.3* 4.7  CL 105 106  CO2 28 9*  GLUCOSE 147* 482*  BUN <5* 20  CREATININE 0.41* 0.92  CALCIUM 8.9 8.9  MG 1.7  --    GFR: Estimated Creatinine Clearance: 76.7 mL/min (by C-G formula based on SCr of 0.92 mg/dL). Liver Function Tests:  Recent Labs Lab 11/18/15 1853  AST 24  ALT 15  ALKPHOS 109  BILITOT 1.8*  PROT 8.4*  ALBUMIN 4.5    Recent Labs Lab 11/18/15 1853  LIPASE 15   Cardiac Enzymes:  Recent Labs Lab 11/18/15 1940  TROPONINI <0.03   CBG:  Recent Labs Lab 11/12/15  [id number] 11/12/15 1146 11/18/15 1821 11/18/15 1929 11/18/15 2039  GLUCAP 69 239* 446* 380* 278*    Problem List  Principal Problem:   DKA (diabetic ketoacidoses) ([place]) Active Problems:   Hematemesis   Diabetic gastroparesis (HCC)   Intractable nausea and vomiting   Assessment: This is a 25 year old female with a past medical history of type 1 diabetes who presents with nausea and vomiting and is found to diabetic ketoacidosis. No obvious precipitant factor has been identified for her DKA. This has been the case previously as well. Patient does have a history of severe diabetic gastroparesis which could be the precipitant factor. She states that she has been compliant with her medications.  Plan: #1 Diabetic ketoacidosis in the setting of type 1 diabetes: Patient will be admitted to the hospital. She'll be placed on insulin infusion as per the DKA protocol. Last HbA1c was greater than 10. She'll be given IV fluids. Once her  acidosis corrects she'll be transitioned back to her basal insulin. Check UA.  #2 nausea, vomiting with hematemesis in the setting of known diabetic gastroparesis: Hematemesis was small amount. This is most likely secondary to mild Mallory-Weiss tear. She has not had any further episodes in the emergency department. She'll be given PPI. She will be placed on intravenous Reglan every 6 hours. Keep her nothing by mouth for now.  #3 Sinus tachycardia: This has been noted during her previous hospitalizations as well. Most likely due to her symptoms, as well as volume depletion. She'll be monitored on telemetry. Lactic acid level is normal. She'll be given IV fluids.  DVT Prophylaxis: SCDs Code Status: Full code Family Communication: Discussed with the patient  Disposition Plan: Stepdown  Consults called: None  Admission status: Inpatient  Further management decisions will depend on results of further testing and patient's response to treatment.   [place]  [facility] Pager [phone]  If 7PM-7AM, please contact night-coverage [email or web address] Password [email or web address]  11/18/2015, 8:52 PM

## 2015-11-18 NOTE — Progress Notes (Addendum)
Patient noted to have had 8 admissions within the last six months.  Patient with IDDM.  EDCM spoke to patient at bedside.  Patient has [employer].  This type of Medicaid does not cover prescription cost.  She reports her pcp is still Dr. Ouida Sills.  She reports since she lost her insurance she has been unable to see her endocrinologist.  She reports she has difficulty affording her medications.  She confirms she has a glucometer at home.  She states she is taking her insulin as prescribed, does not have any difficulty with transportation to her doctor appointments.  Patient is agreeable for [facility] to email [facility] in efforts to obtain an appointment.  Patient may qualify for [facility] clinic with [facility].  Patient thankful for assistance.  No further EDCM needs at this time.  Patient provided phone number [phone] for contact.

## 2015-11-18 NOTE — ED Notes (Signed)
I attempted twice to collect labs and was unsuccessful 

## 2015-11-18 NOTE — ED Notes (Signed)
Contacted lab and they will come and draw blood.

## 2015-11-18 NOTE — ED Triage Notes (Signed)
Pt BIB mom, c/o hematemesis x1 day. Hx gastroparesis. Pt actively vomiting in triage.

## 2015-11-18 NOTE — ED Provider Notes (Signed)
[facility] DEPT Provider Note   CSN: [id number] Arrival date & time: 11/18/15  1653     History   Chief Complaint Chief Complaint  Patient presents with  . Abdominal Pain  . Hematemesis    HPI [name] is a 25 y.o. female.  HPI   Patient is a 25 year old female who has been to the emergency department 15 times the last 6 months. Patient is type I diabetic with history of gastroparesis. Patient started having nausea and vomiting last night. This continued until this morning. Mom reports that she feels that her sugar was running high prior to arrival. Patient noticed red streaks in her vomit after multiple times vomiting.  No fever.  Past Medical History:  Diagnosis Date  . Anxiety   . Arthritis   . Asthma   . Diabetes mellitus 2007   IDDM.  poorly controlled, multiple admits with DKA  . Gallstones   . Heart murmur   . Hepatic steatosis 11/26/2014   and hepatomegaly  . Hypertension    NOT CURRENTLY ON ANY BP MED  . Liver mass 11/26/2014  . Pancreatitis, acute 11/26/2014    Patient Active Problem List   Diagnosis Date Noted  . Diarrhea 11/09/2015  . Acute urinary retention   . Gastroparesis due to DM ([place]) 07/10/2015  . GERD (gastroesophageal reflux disease) 07/10/2015  . Depression with anxiety 07/10/2015  . Uncontrolled type 1 diabetes mellitus with hyperglycemia ([place])   . Gastroparesis 06/22/2015  . Altered mental status 06/22/2015  . Volume depletion 06/10/2015  . Protein-calorie malnutrition, severe 06/10/2015  . Hyperglycemia   . Intractable vomiting with nausea   . Elevated bilirubin   . Type 1 diabetes mellitus with hyperglycemia ([place]) 05/29/2015  . Hematemesis with nausea   . Intractable vomiting 05/28/2015  . Intractable nausea and vomiting 05/28/2015  . Abdominal pain in female   . Abdominal pain 05/24/2015  . Hypertension 05/24/2015  . Dehydration   . Diabetic gastroparesis ([place])   . Chronic diastolic heart failure ([place]) 04/11/2015   . Hematemesis 04/08/2015  . DKA (diabetic ketoacidoses) ([place]) 03/22/2015  . Nausea vomiting and diarrhea 03/22/2015  . Nausea & vomiting   . Diabetic ketoacidosis without coma associated with type 1 diabetes mellitus ([place])   . S/P laparoscopic cholecystectomy 02/11/2015  . Status post laparoscopic cholecystectomy 02/11/2015  . Postextubation stridor   . Nausea and vomiting 11/27/2014  . Pancreatitis, acute 11/26/2014  . Volume overload 11/26/2014  . Hypokalemia 11/26/2014  . Hepatic steatosis 11/26/2014  . Liver mass 11/26/2014  . Sepsis ([place]) 11/25/2014  . Sinus tachycardia 11/25/2014  . Hypomagnesemia 11/25/2014  . Hypophosphatemia 11/25/2014  . Elevated amylase and lipase 11/25/2014  . DKA, type 1 ([place]) 11/24/2014  . Elevated LFTs 11/24/2014  . Acute kidney injury ([place]) 11/24/2014  . Migraine headache 11/24/2014  . Asthma 06/29/2012  . Uncontrolled type 1 diabetes mellitus ([place]) 06/19/2010  . Goiter, unspecified 06/19/2010    Past Surgical History:  Procedure Laterality Date  . CHOLECYSTECTOMY N/A 02/11/2015   Procedure: LAPAROSCOPIC CHOLECYSTECTOMY WITH INTRAOPERATIVE CHOLANGIOGRAM;  Surgeon: Greer Pickerel, MD;  Location: [facility];  Service: General;  Laterality: N/A;  . ESOPHAGOGASTRODUODENOSCOPY (EGD) WITH PROPOFOL Left 09/20/2014   Procedure: ESOPHAGOGASTRODUODENOSCOPY (EGD) WITH PROPOFOL;  Surgeon: Arta Silence, MD;  Location: [facility] ENDOSCOPY;  Service: Endoscopy;  Laterality: Left;  . WISDOM TOOTH EXTRACTION      OB History    Gravida Para Term Preterm AB Living   2 1 0 1 1 1  SAB TAB Ectopic Multiple Live Births   0 1 0 0 1       Home Medications    Prior to Admission medications   Medication Sig Start Date End Date Taking? Authorizing Provider  albuterol (PROVENTIL HFA;VENTOLIN HFA) 108 (90 BASE) MCG/ACT inhaler Inhale 2 puffs into the lungs every 6 (six) hours as needed for shortness of breath.   Yes Historical Provider, MD  albuterol (PROVENTIL) (2.5 MG/3ML)  0.083% nebulizer solution Take 2.5 mg by nebulization every 6 (six) hours as needed for shortness of breath.   Yes Historical Provider, MD  budesonide-formoterol (SYMBICORT) 160-4.5 MCG/ACT inhaler Inhale 2 puffs into the lungs daily.    Yes Historical Provider, MD  escitalopram (LEXAPRO) 10 MG tablet Take 5 mg by mouth daily. 06/20/15 06/19/16 Yes Historical Provider, MD  insulin aspart (NOVOLOG FLEXPEN) 100 UNIT/ML FlexPen INJECT 0-20 UNITS INTO THE SKIN AS DIRECTED. SSI - CARB SCALE, 0-20 UNITS Patient taking differently: Inject 0-20 Units into the skin 4 (four) times daily as needed for high blood sugar. Sliding scale 10/19/13  Yes Elayne Snare, MD  Insulin Glargine (BASAGLAR KWIKPEN [place]) Inject 24 Units into the skin at bedtime.   Yes Historical Provider, MD  metoCLOPramide (REGLAN) 10 MG tablet Take 1 tablet (10 mg total) by mouth 3 (three) times daily before meals. 11/06/15  Yes Janece Canterbury, MD  montelukast (SINGULAIR) 10 MG tablet Take 10 mg by mouth daily.   Yes Historical Provider, MD  promethazine (PHENERGAN) 25 MG tablet Take 1 tablet (25 mg total) by mouth every 6 (six) hours as needed for refractory nausea / vomiting. 11/06/15  Yes Janece Canterbury, MD    Family History Family History  Problem Relation Age of Onset  . Heart disease Maternal Grandmother   . Heart disease Maternal Grandfather   . Diabetes Mother   . Hyperlipidemia Mother   . Hypertension Father   . Heart disease Father   . Hypertension Paternal Grandmother   . Cancer Paternal Grandfather     Social History Social History  Substance Use Topics  . Smoking status: Never Smoker  . Smokeless tobacco: Never Used  . Alcohol use No     Allergies   Peanut-containing drug products; Strawberry extract; and Ultram [tramadol]   Review of Systems Review of Systems  Constitutional: Negative for fatigue and fever.  Cardiovascular: Negative for chest pain.  Gastrointestinal: Positive for abdominal pain.    Psychiatric/Behavioral: Positive for confusion.  All other systems reviewed and are negative.    Physical Exam Updated Vital Signs BP 146/100   Pulse (!) 127   Temp 97.7 F (36.5 C) (Oral)   Resp 19   Ht 5\' 3"  (1.6 m)   Wt 120 lb (54.4 kg)   LMP 10/19/2015   SpO2 100%   BMI 21.26 kg/m   Physical Exam  Constitutional: She is oriented to person, place, and time. She appears well-developed and well-nourished.  HENT:  Head: Normocephalic and atraumatic.  Dry mucous membranes  Eyes: Pupils are equal, round, and reactive to light. Right eye exhibits no discharge.  Cardiovascular:  tachycardua  Pulmonary/Chest: No respiratory distress.  tachypnic  Abdominal: Soft. There is tenderness.  Musculoskeletal: Normal range of motion.  Neurological: She is oriented to person, place, and time.  Skin: Skin is warm and dry. She is not diaphoretic.  Nursing note and vitals reviewed.    ED Treatments / Results  Labs (all labs ordered are listed, but only abnormal results are displayed) Labs Reviewed  COMPREHENSIVE METABOLIC PANEL - Abnormal; Notable for the following:       Result Value   Sodium 134 (*)    CO2 9 (*)    Glucose, Bld 482 (*)    Total Protein 8.4 (*)    Total Bilirubin 1.8 (*)    Anion gap 19 (*)    All other components within normal limits  CBC - Abnormal; Notable for the following:    Platelets 449 (*)    All other components within normal limits  BLOOD GAS, VENOUS - Abnormal; Notable for the following:    pH, Ven 7.176 (*)    pCO2, Ven 24.9 (*)    pO2, Ven 58.2 (*)    Bicarbonate 8.9 (*)    Acid-base deficit 18.4 (*)    All other components within normal limits  GLUCOSE, CAPILLARY - Abnormal; Notable for the following:    Glucose-Capillary 219 (*)    All other components within normal limits  CBG MONITORING, ED - Abnormal; Notable for the following:    Glucose-Capillary 446 (*)    All other components within normal limits  CBG MONITORING, ED - Abnormal;  Notable for the following:    Glucose-Capillary 380 (*)    All other components within normal limits  CBG MONITORING, ED - Abnormal; Notable for the following:    Glucose-Capillary 278 (*)    All other components within normal limits  CBG MONITORING, ED - Abnormal; Notable for the following:    Glucose-Capillary 139 (*)    All other components within normal limits  LIPASE, BLOOD  TROPONIN I  LACTIC ACID, PLASMA  LACTIC ACID, PLASMA  BASIC METABOLIC PANEL  BASIC METABOLIC PANEL  BASIC METABOLIC PANEL  BASIC METABOLIC PANEL  URINALYSIS, ROUTINE W REFLEX MICROSCOPIC (NOT AT [facility])  CBC  POC OCCULT BLOOD, ED  TYPE AND SCREEN    EKG  EKG Interpretation None       Radiology No results found.  Procedures Procedures (including critical care time)  Medications Ordered in ED Medications  mometasone-formoterol (DULERA) 200-5 MCG/ACT inhaler 2 puff (not administered)  0.9 %  sodium chloride infusion (not administered)  dextrose 5 %-0.45 % sodium chloride infusion (not administered)  insulin regular (NOVOLIN R,HUMULIN R) 250 Units in sodium chloride 0.9 % 250 mL (1 Units/mL) infusion (not administered)  sodium chloride flush (NS) 0.9 % injection 3 mL (not administered)  acetaminophen (TYLENOL) tablet 650 mg (not administered)    Or  acetaminophen (TYLENOL) suppository 650 mg (not administered)  ondansetron (ZOFRAN) tablet 4 mg (not administered)    Or  ondansetron (ZOFRAN) injection 4 mg (not administered)  pantoprazole (PROTONIX) injection 40 mg (not administered)  morphine 2 MG/ML injection 1 mg (1 mg Intravenous Given 11/18/15 2121)  metoCLOPramide (REGLAN) injection 10 mg (not administered)  sodium chloride 0.9 % bolus 1,000 mL (0 mLs Intravenous Stopped 11/18/15 2130)  ondansetron (ZOFRAN) injection 4 mg (4 mg Intravenous Given 11/18/15 1831)  morphine 2 MG/ML injection 2 mg (2 mg Intravenous Given 11/18/15 1831)  insulin aspart (novoLOG) injection 10 Units (10 Units  Intravenous Given 11/18/15 1858)  sodium chloride 0.9 % bolus 1,000 mL (0 mLs Intravenous Stopped 11/18/15 2130)     Initial Impression / Assessment and Plan / ED Course  I have reviewed the triage vital signs and the nursing notes.  Pertinent labs & imaging results that were available during my care of the patient were reviewed by me and considered in my medical decision making (see chart for details).  Clinical Course    Patient is a 25 year old female with history of gastroparesis and type 1 diabetes presenting today with tachypnea, tachycardia, vomiting and a sugar of 400. Suspect DKA versus gastroparesis. Will treat symptomatically, get labs. Suspect Mallory-Weiss tear given multiple episodes of vomiting and then having blood in her vomit. No active bleeidng.   Did do type and cross.   No other source known for DKA.  SHe had had multiple admission for gastroparesis/DKA with no known inciting event.   Will start DKA orderset and admit to stepdown.  CRITICAL CARE Performed by: Gardiner Sleeper Total critical care time: 64minutes Critical care time was exclusive of separately billable procedures and treating other patients. Critical care was necessary to treat or prevent imminent or life-threatening deterioration. Critical care was time spent personally by me on the following activities: development of treatment plan with patient and/or surrogate as well as nursing, discussions with consultants, evaluation of patient's response to treatment, examination of patient, obtaining history from patient or surrogate, ordering and performing treatments and interventions, ordering and review of laboratory studies, ordering and review of radiographic studies, pulse oximetry and re-evaluation of patient's condition.   Final Clinical Impressions(s) / ED Diagnoses   Final diagnoses:  None    New Prescriptions Current Discharge Medication List       Zenaida Tesar Julio Alm, MD 11/18/15  2250

## 2015-11-19 DIAGNOSIS — E1310 Other specified diabetes mellitus with ketoacidosis without coma: Secondary | ICD-10-CM

## 2015-11-19 LAB — CBC
HCT: 33.8 % — ABNORMAL LOW (ref 36.0–46.0)
Hemoglobin: 11.1 g/dL — ABNORMAL LOW (ref 12.0–15.0)
MCH: 30.4 pg (ref 26.0–34.0)
MCHC: 32.8 g/dL (ref 30.0–36.0)
MCV: 92.6 fL (ref 78.0–100.0)
PLATELETS: 397 10*3/uL (ref 150–400)
RBC: 3.65 MIL/uL — ABNORMAL LOW (ref 3.87–5.11)
RDW: 14 % (ref 11.5–15.5)
WBC: 15.7 10*3/uL — AB (ref 4.0–10.5)

## 2015-11-19 LAB — BASIC METABOLIC PANEL
ANION GAP: 10 (ref 5–15)
Anion gap: 9 (ref 5–15)
Anion gap: 9 (ref 5–15)
Anion gap: 7 (ref 5–15)
Anion gap: 9 (ref 5–15)
BUN: 13 mg/dL (ref 6–20)
BUN: 10 mg/dL (ref 6–20)
BUN: 8 mg/dL (ref 6–20)
BUN: 6 mg/dL (ref 6–20)
CALCIUM: 7.7 mg/dL — AB (ref 8.9–10.3)
CALCIUM: 8 mg/dL — AB (ref 8.9–10.3)
CALCIUM: 8.1 mg/dL — AB (ref 8.9–10.3)
CO2: 11 mmol/L — ABNORMAL LOW (ref 22–32)
CO2: 12 mmol/L — ABNORMAL LOW (ref 22–32)
CO2: 15 mmol/L — ABNORMAL LOW (ref 22–32)
CO2: 14 mmol/L — ABNORMAL LOW (ref 22–32)
CO2: 15 mmol/L — ABNORMAL LOW (ref 22–32)
CREATININE: 0.49 mg/dL (ref 0.44–1.00)
CREATININE: 0.48 mg/dL (ref 0.44–1.00)
CREATININE: 0.49 mg/dL (ref 0.44–1.00)
Calcium: 7.7 mg/dL — ABNORMAL LOW (ref 8.9–10.3)
Calcium: 8.4 mg/dL — ABNORMAL LOW (ref 8.9–10.3)
Chloride: 119 mmol/L — ABNORMAL HIGH (ref 101–111)
Chloride: 115 mmol/L — ABNORMAL HIGH (ref 101–111)
Chloride: 114 mmol/L — ABNORMAL HIGH (ref 101–111)
Chloride: 111 mmol/L (ref 101–111)
Chloride: 113 mmol/L — ABNORMAL HIGH (ref 101–111)
Creatinine, Ser: 0.48 mg/dL (ref 0.44–1.00)
Creatinine, Ser: 0.39 mg/dL — ABNORMAL LOW (ref 0.44–1.00)
GFR calc Af Amer: >60 mL/min (ref >60)
GFR calc Af Amer: >60 mL/min (ref >60)
GLUCOSE: 190 mg/dL — AB (ref 65–99)
GLUCOSE: 178 mg/dL — AB (ref 65–99)
Glucose, Bld: 138 mg/dL — ABNORMAL HIGH (ref 65–99)
Glucose, Bld: 190 mg/dL — ABNORMAL HIGH (ref 65–99)
Glucose, Bld: 165 mg/dL — ABNORMAL HIGH (ref 65–99)
Potassium: 3.9 mmol/L (ref 3.5–5.1)
Potassium: 3.6 mmol/L (ref 3.5–5.1)
Potassium: 3.3 mmol/L — ABNORMAL LOW (ref 3.5–5.1)
Potassium: 4.1 mmol/L (ref 3.5–5.1)
Potassium: 3.2 mmol/L — ABNORMAL LOW (ref 3.5–5.1)
SODIUM: 139 mmol/L (ref 135–145)
SODIUM: 136 mmol/L (ref 135–145)
SODIUM: 136 mmol/L (ref 135–145)
SODIUM: 135 mmol/L (ref 135–145)
Sodium: 137 mmol/L (ref 135–145)

## 2015-11-19 LAB — LACTIC ACID, PLASMA: LACTIC ACID, VENOUS: 1.4 mmol/L (ref 0.5–1.9)

## 2015-11-19 LAB — GLUCOSE, CAPILLARY
GLUCOSE-CAPILLARY: 137 mg/dL — AB (ref 65–99)
GLUCOSE-CAPILLARY: 122 mg/dL — AB (ref 65–99)
GLUCOSE-CAPILLARY: 153 mg/dL — AB (ref 65–99)
GLUCOSE-CAPILLARY: 177 mg/dL — AB (ref 65–99)
GLUCOSE-CAPILLARY: 165 mg/dL — AB (ref 65–99)
GLUCOSE-CAPILLARY: 142 mg/dL — AB (ref 65–99)
GLUCOSE-CAPILLARY: 164 mg/dL — AB (ref 65–99)
GLUCOSE-CAPILLARY: 150 mg/dL — AB (ref 65–99)
GLUCOSE-CAPILLARY: 211 mg/dL — AB (ref 65–99)
GLUCOSE-CAPILLARY: 191 mg/dL — AB (ref 65–99)
GLUCOSE-CAPILLARY: 158 mg/dL — AB (ref 65–99)
GLUCOSE-CAPILLARY: 149 mg/dL — AB (ref 65–99)
Glucose-Capillary: 115 mg/dL — ABNORMAL HIGH (ref 65–99)
Glucose-Capillary: 143 mg/dL — ABNORMAL HIGH (ref 65–99)
Glucose-Capillary: 143 mg/dL — ABNORMAL HIGH (ref 65–99)
Glucose-Capillary: 143 mg/dL — ABNORMAL HIGH (ref 65–99)
Glucose-Capillary: 148 mg/dL — ABNORMAL HIGH (ref 65–99)
Glucose-Capillary: 166 mg/dL — ABNORMAL HIGH (ref 65–99)
Glucose-Capillary: 178 mg/dL — ABNORMAL HIGH (ref 65–99)
Glucose-Capillary: 189 mg/dL — ABNORMAL HIGH (ref 65–99)
Glucose-Capillary: 203 mg/dL — ABNORMAL HIGH (ref 65–99)
Glucose-Capillary: 207 mg/dL — ABNORMAL HIGH (ref 65–99)

## 2015-11-19 MED ORDER — SODIUM CHLORIDE 0.9 % IV BOLUS (SEPSIS)
2000.0000 mL | Freq: Once | INTRAVENOUS | Status: AC
  Administered 2015-11-19: 2000 mL via INTRAVENOUS

## 2015-11-19 MED ORDER — KETOROLAC TROMETHAMINE 30 MG/ML IJ SOLN
30.0000 mg | Freq: Four times a day (QID) | INTRAMUSCULAR | Status: DC | PRN
  Administered 2015-11-19 – 2015-11-21 (×5): 30 mg via INTRAVENOUS
  Filled 2015-11-19 (×5): qty 1

## 2015-11-19 MED ORDER — PROCHLORPERAZINE EDISYLATE 5 MG/ML IJ SOLN
5.0000 mg | Freq: Once | INTRAMUSCULAR | Status: AC
  Administered 2015-11-19: 5 mg via INTRAVENOUS
  Filled 2015-11-19: qty 2

## 2015-11-19 MED ORDER — POTASSIUM CHLORIDE 10 MEQ/100ML IV SOLN
10.0000 meq | INTRAVENOUS | Status: AC
Start: 2015-11-19 — End: 2015-11-19
  Administered 2015-11-19 (×2): 10 meq via INTRAVENOUS
  Filled 2015-11-19 (×2): qty 100

## 2015-11-19 MED ORDER — POTASSIUM CHLORIDE 10 MEQ/100ML IV SOLN
10.0000 meq | INTRAVENOUS | Status: AC
  Administered 2015-11-19 (×2): 10 meq via INTRAVENOUS
  Filled 2015-11-19 (×2): qty 100

## 2015-11-19 MED ORDER — SODIUM CHLORIDE 0.9 % IV BOLUS (SEPSIS)
1000.0000 mL | Freq: Once | INTRAVENOUS | Status: AC
  Administered 2015-11-19: 1000 mL via INTRAVENOUS

## 2015-11-19 MED ORDER — POTASSIUM CHLORIDE 10 MEQ/100ML IV SOLN
10.0000 meq | INTRAVENOUS | Status: AC
  Administered 2015-11-19 – 2015-11-20 (×3): 10 meq via INTRAVENOUS
  Filled 2015-11-19 (×3): qty 100

## 2015-11-19 NOTE — Progress Notes (Signed)
Resumed patient care at this time. Agree with previous RN assessment. Orders reviewed. Pt resting comfortably in bed, no complains of pain or nausea at this time. Will continue to monitor. Adah Salvage, RN

## 2015-11-19 NOTE — Progress Notes (Signed)
Inpatient Diabetes Program Recommendations  AACE/ADA: New Consensus Statement on Inpatient Glycemic Control (2015)  Target Ranges:  Prepandial:   less than 140 mg/dL      Peak postprandial:   less than 180 mg/dL (1-2 hours)      Critically ill patients:  140 - 180 mg/dL   Lab Results  Component Value Date   GLUCAP 178 (H) 11/19/2015   HGBA1C 10.3 (H) 11/10/2015    Inpatient Diabetes Program Recommendations:     Admit with: DKA  History: DM1, Repeat DKA, Gastroparesis  Home DM Meds: Basaglar (Insulin glargine) 24 units QHS                             Novolog 0-20 units QID per SSI  Current Insulin Orders: IV Insulin drip     -Patient well known to the Inpatient DM Program.  This is patient's 11th admission since January of this year.  -Has been counseled about the importance of glucose control during past admissions.  Has stated to our team before that she "knows what to do" and just needs "to do better".    -Per review of Care Everywhere tab, found that patient saw her Endocrinologist Dr. Hartford Poli with [facility] on 10/02/15.  At that visit, patient was counseled at length about the dangers of uncontrolled CBGs.  Review of her CBG meter showed she was only checking her CBGs 0-1 times per day.  Patient then admitted to missing Novolog doses often and has trouble taking charge of her diabetes.  Told Dr. Hartford Poli that she just wants to "be normal".  Dr. Hartford Poli reinforced to patient that her diabetes is not going to go away and that she has to take responsibility for her health so that she can continue to care for her young child.  Patient was referred to the Outpatient Diabetes Educator with [facility] and was also referred to Mental Health counselor.  Unsure of pt has followed up with Mental Health referral.  -At that visit, Ms. Mathew was instructed to: 1. Reduce her Basaglar (insulin glargine) to 20 units QHS 2. Continue Novolog 1 unit for every 10 grams Carbohydrates eaten 2.  Continue Novolog 1 unit for every 50 mg/dl above target CBG of 150 mg/dl     MD- Once patient ready to transition to SQ Insulin (CO2 20, Anion Gap less than 12), please consider the following:  1. Start Lantus 20 units daily (make sure to give 1st Lantus dose at least 1 hour before IV Insulin drip stopped) 2. Novolog sensitive tidwc and hs 3. Novolog 4 units tidwc for meal coverage insulin if pt eats > 50% meal.   Will follow. Thank you. Lorenda Peck, RD, LDN, CDE Inpatient Diabetes Coordinator [phone]

## 2015-11-19 NOTE — Progress Notes (Signed)
CRITICAL VALUE ALERT  Critical value received: Lactic Acid 2.4  Date of notification: 11/18/15  Time of notification:  2349  Critical value read back:Yes.    Nurse who received alert:  Duard Larsen, RN  MD notified (1st page): Tylene Fantasia, NP  Time of first page:  0000

## 2015-11-19 NOTE — Progress Notes (Signed)
PROGRESS NOTE    [name]  [id number] DOB: 1990-03-12 DOA: 11/18/2015 PCP: Vicenta Aly, FNP    Brief Narrative:  25 y/o that presented with DKA. Reportedly didn't f/u with endocrinologist as outpatient due to financial constraints.   Assessment & Plan:   Principal Problem:   DKA (diabetic ketoacidoses) ([place]) - on insulin gtt. CO2 15 as such will continue insulin gtt - Once bicarbonate normalizes and acidosis resolves will plan on transitioning to SQ insulin regimen - Will need to replace potassium while patient is on insulin gtt  Hypokalemia - replace K   Active Problems:   Hematemesis - secondary to elevated acid levels secondary to principle problem. Will continue supportive therapy    Diabetic gastroparesis ([place]) - will continue reglan   DVT prophylaxis: early ambulation and scd's Code Status: Full Family Communication: none at bedside Disposition Plan: step down while acidotic   Consultants:   None   Procedures: None   Antimicrobials: None   Subjective: Pt has no new complaints. No acute issues overnight.  Objective: Vitals:   11/19/15 0600 11/19/15 0700 11/19/15 0800 11/19/15 0832  BP: (!) 151/87  120/69   Pulse: (!) 122 (!) 115 (!) 112 (!) 111  Resp: 15 15 18 16   Temp:   98.6 F (37 C)   TempSrc:   Oral   SpO2: 100% 100% 99% 100%  Weight:      Height:        Intake/Output Summary (Last 24 hours) at 11/19/15 1143 Last data filed at 11/19/15 0900  Gross per 24 hour  Intake          3514.16 ml  Output             2700 ml  Net           814.16 ml   Filed Weights   11/18/15 1717 11/18/15 2236  Weight: 54.4 kg (120 lb) 48.8 kg (107 lb 9.4 oz)    Examination:  General exam: Appears calm and comfortable, in nad. Respiratory system: Clear to auscultation. Respiratory effort normal, equal chest rise. Cardiovascular system: S1 & S2 heard, RRR. No JVD, murmurs, rubs, gallops or clicks. No pedal edema. Gastrointestinal  system: Abdomen is nondistended, soft and nontender. No organomegaly or masses felt. Normal bowel sounds heard. Central nervous system: Alert and oriented. No focal neurological deficits. Extremities: Symmetric 5 x 5 power. Skin: No rashes, lesions or ulcers, on limited exam. Psychiatry: Judgement and insight appear normal. Mood & affect appropriate.   Data Reviewed: I have personally reviewed following labs and imaging studies  CBC:  Recent Labs Lab 11/18/15 1853 11/19/15 0301  WBC 9.0 15.7*  HGB 13.6 11.1*  HCT 41.9 33.8*  MCV 92.9 92.6  PLT 449* [id number]   Basic Metabolic Panel:  Recent Labs Lab 11/18/15 1853 11/18/15 2245 11/19/15 0301 11/19/15 0652 11/19/15 1040  NA 134* 139 139 136 136  K 4.7 4.5 3.9 3.6 3.3*  CL 106 113* 119* 115* 114*  CO2 9* 11* 11* 12* 15*  GLUCOSE 482* 229* 138* 190* 165*  BUN 20 18 13 10 8   CREATININE 0.92 0.73 0.49 0.48 0.49  CALCIUM 8.9 8.9 7.7* 7.7* 8.0*   GFR: Estimated Creatinine Clearance: 82.1 mL/min (by C-G formula based on SCr of 0.49 mg/dL). Liver Function Tests:  Recent Labs Lab 11/18/15 1853  AST 24  ALT 15  ALKPHOS 109  BILITOT 1.8*  PROT 8.4*  ALBUMIN 4.5    Recent Labs Lab 11/18/15 1853  LIPASE 15  No results for input(s): AMMONIA in the last 168 hours. Coagulation Profile: No results for input(s): INR, PROTIME in the last 168 hours. Cardiac Enzymes:  Recent Labs Lab 11/18/15 1940  TROPONINI <0.03   BNP (last 3 results) No results for input(s): PROBNP in the last 8760 hours. HbA1C: No results for input(s): HGBA1C in the last 72 hours. CBG:  Recent Labs Lab 11/19/15 0644 11/19/15 0747 11/19/15 0854 11/19/15 0957 11/19/15 1100  GLUCAP 165* 189* 178* 142* 143*   Lipid Profile: No results for input(s): CHOL, HDL, LDLCALC, TRIG, CHOLHDL, LDLDIRECT in the last 72 hours. Thyroid Function Tests: No results for input(s): TSH, T4TOTAL, FREET4, T3FREE, THYROIDAB in the last 72 hours. Anemia Panel: No  results for input(s): VITAMINB12, FOLATE, FERRITIN, TIBC, IRON, RETICCTPCT in the last 72 hours. Sepsis Labs:  Recent Labs Lab 11/18/15 1902 11/18/15 2245 11/19/15 [id number]  LATICACIDVEN 1.2 2.4* 1.4    Recent Results (from the past 240 hour(s))  Culture, blood (Routine X 2) w Reflex to ID Panel     Status: None   Collection Time: 11/10/15  2:04 AM  Result Value Ref Range Status   Specimen Description BLOOD BLOOD LEFT HAND  Final   Special Requests IN PEDIATRIC BOTTLE 2ML  Final   Culture   Final    NO GROWTH 5 DAYS Performed at [facility]    Report Status 11/15/2015 FINAL  Final  Culture, blood (Routine X 2) w Reflex to ID Panel     Status: None   Collection Time: 11/10/15  6:10 AM  Result Value Ref Range Status   Specimen Description BLOOD LEFT ANTECUBITAL  Final   Special Requests IN PEDIATRIC BOTTLE [place]  Final   Culture   Final    NO GROWTH 5 DAYS Performed at [facility]    Report Status 11/15/2015 FINAL  Final  Urine culture     Status: None   Collection Time: 11/10/15  5:25 PM  Result Value Ref Range Status   Specimen Description URINE, RANDOM  Final   Special Requests NONE  Final   Culture NO GROWTH Performed at [facility]   Final   Report Status 11/12/2015 FINAL  Final         Radiology Studies: No results found.      Scheduled Meds: . mouth rinse  15 mL Mouth Rinse BID  . metoCLOPramide (REGLAN) injection  10 mg Intravenous Q6H  . mometasone-formoterol  2 puff Inhalation BID  . pantoprazole  40 mg Intravenous Q12H  . sodium chloride flush  3 mL Intravenous Q12H   Continuous Infusions: . dextrose 5 % and 0.45% NaCl 125 mL/hr at 11/19/15 0520  . insulin (NOVOLIN-R) infusion 1.7 Units/hr (11/19/15 1121)     LOS: 1 day    Time spent: > 35 minutes  Velvet Bathe, MD [facility] Pager [phone]  If 7PM-7AM, please contact night-coverage [email or web address] Password [email or web address] 11/19/2015, 11:43 AM

## 2015-11-19 NOTE — Care Management Note (Signed)
Case Management Note  Patient Details  Name: [name] MRN: [id number] Date of Birth: 07-07-1990  Subjective/Objective:         uti in a known diabetic with sepsis           Action/Plan: home   Expected Discharge Date:   (unknown)               Expected Discharge Plan:  Home/Self Care  In-House Referral:     Discharge planning Services     Post Acute Care Choice:    Choice offered to:     DME Arranged:    DME Agency:     HH Arranged:    [place] Agency:     Status of Service:  In process, will continue to follow  If discussed at Long Length of Stay Meetings, dates discussed:    Additional Comments:Date:  November 19, 2015 Chart reviewed for concurrent status and case management needs. Will continue to follow the patient for status change: Discharge Planning: following for needs Expected discharge date: [id number] Velva Harman, BSN, [place], [place]  Leeroy Cha, RN 11/19/2015, 8:26 AM

## 2015-11-20 ENCOUNTER — Inpatient Hospital Stay (HOSPITAL_COMMUNITY): Payer: Medicaid Other

## 2015-11-20 DIAGNOSIS — G43A1 Cyclical vomiting, intractable: Secondary | ICD-10-CM

## 2015-11-20 LAB — GLUCOSE, CAPILLARY
GLUCOSE-CAPILLARY: 133 mg/dL — AB (ref 65–99)
GLUCOSE-CAPILLARY: 135 mg/dL — AB (ref 65–99)
GLUCOSE-CAPILLARY: 149 mg/dL — AB (ref 65–99)
GLUCOSE-CAPILLARY: 154 mg/dL — AB (ref 65–99)
GLUCOSE-CAPILLARY: 157 mg/dL — AB (ref 65–99)
GLUCOSE-CAPILLARY: 162 mg/dL — AB (ref 65–99)
GLUCOSE-CAPILLARY: 177 mg/dL — AB (ref 65–99)
GLUCOSE-CAPILLARY: 219 mg/dL — AB (ref 65–99)
GLUCOSE-CAPILLARY: 224 mg/dL — AB (ref 65–99)
Glucose-Capillary: 121 mg/dL — ABNORMAL HIGH (ref 65–99)
Glucose-Capillary: 126 mg/dL — ABNORMAL HIGH (ref 65–99)
Glucose-Capillary: 133 mg/dL — ABNORMAL HIGH (ref 65–99)
Glucose-Capillary: 135 mg/dL — ABNORMAL HIGH (ref 65–99)
Glucose-Capillary: 136 mg/dL — ABNORMAL HIGH (ref 65–99)
Glucose-Capillary: 141 mg/dL — ABNORMAL HIGH (ref 65–99)
Glucose-Capillary: 142 mg/dL — ABNORMAL HIGH (ref 65–99)
Glucose-Capillary: 149 mg/dL — ABNORMAL HIGH (ref 65–99)
Glucose-Capillary: 150 mg/dL — ABNORMAL HIGH (ref 65–99)
Glucose-Capillary: 166 mg/dL — ABNORMAL HIGH (ref 65–99)
Glucose-Capillary: 200 mg/dL — ABNORMAL HIGH (ref 65–99)
Glucose-Capillary: 217 mg/dL — ABNORMAL HIGH (ref 65–99)
Glucose-Capillary: 248 mg/dL — ABNORMAL HIGH (ref 65–99)

## 2015-11-20 LAB — CBC
HCT: 32.8 % — ABNORMAL LOW (ref 36.0–46.0)
Hemoglobin: 10.9 g/dL — ABNORMAL LOW (ref 12.0–15.0)
MCH: 30.2 pg (ref 26.0–34.0)
MCHC: 33.2 g/dL (ref 30.0–36.0)
MCV: 90.9 fL (ref 78.0–100.0)
PLATELETS: 345 10*3/uL (ref 150–400)
RBC: 3.61 MIL/uL — ABNORMAL LOW (ref 3.87–5.11)
RDW: 13.5 % (ref 11.5–15.5)
WBC: 8 10*3/uL (ref 4.0–10.5)

## 2015-11-20 LAB — BLOOD GAS, ARTERIAL
ACID-BASE DEFICIT: 7.4 mmol/L — AB (ref 0.0–2.0)
Bicarbonate: 16.8 mmol/L — ABNORMAL LOW (ref 20.0–28.0)
DRAWN BY: 295031
FIO2: 21
O2 SAT: 98.1 %
PATIENT TEMPERATURE: 98.6
pCO2 arterial: 31 mmHg — ABNORMAL LOW (ref 32.0–48.0)
pH, Arterial: 7.352 (ref 7.350–7.450)
pO2, Arterial: 99 mmHg (ref 83.0–108.0)

## 2015-11-20 LAB — BASIC METABOLIC PANEL
ANION GAP: 12 (ref 5–15)
Anion gap: 4 — ABNORMAL LOW (ref 5–15)
Anion gap: 5 (ref 5–15)
Anion gap: 8 (ref 5–15)
BUN: <5 mg/dL — ABNORMAL LOW (ref 6–20)
CALCIUM: 7.7 mg/dL — AB (ref 8.9–10.3)
CALCIUM: 8 mg/dL — AB (ref 8.9–10.3)
CHLORIDE: 111 mmol/L (ref 101–111)
CHLORIDE: 108 mmol/L (ref 101–111)
CO2: 16 mmol/L — ABNORMAL LOW (ref 22–32)
CO2: 18 mmol/L — ABNORMAL LOW (ref 22–32)
CO2: 18 mmol/L — ABNORMAL LOW (ref 22–32)
CO2: 20 mmol/L — AB (ref 22–32)
CREATININE: 0.4 mg/dL — AB (ref 0.44–1.00)
CREATININE: 0.43 mg/dL — AB (ref 0.44–1.00)
Calcium: 8.5 mg/dL — ABNORMAL LOW (ref 8.9–10.3)
Calcium: 8.5 mg/dL — ABNORMAL LOW (ref 8.9–10.3)
Chloride: 114 mmol/L — ABNORMAL HIGH (ref 101–111)
Chloride: 114 mmol/L — ABNORMAL HIGH (ref 101–111)
Creatinine, Ser: 0.46 mg/dL (ref 0.44–1.00)
Creatinine, Ser: 0.6 mg/dL (ref 0.44–1.00)
GFR calc Af Amer: >60 mL/min (ref >60)
GFR calc Af Amer: >60 mL/min (ref >60)
GFR calc Af Amer: >60 mL/min (ref >60)
GFR calc Af Amer: >60 mL/min (ref >60)
GFR calc non Af Amer: >60 mL/min (ref >60)
GLUCOSE: 203 mg/dL — AB (ref 65–99)
GLUCOSE: 151 mg/dL — AB (ref 65–99)
GLUCOSE: 154 mg/dL — AB (ref 65–99)
Glucose, Bld: 154 mg/dL — ABNORMAL HIGH (ref 65–99)
POTASSIUM: 3.2 mmol/L — AB (ref 3.5–5.1)
POTASSIUM: 3.5 mmol/L (ref 3.5–5.1)
Potassium: 3.3 mmol/L — ABNORMAL LOW (ref 3.5–5.1)
Potassium: 3.3 mmol/L — ABNORMAL LOW (ref 3.5–5.1)
SODIUM: 139 mmol/L (ref 135–145)
SODIUM: 136 mmol/L (ref 135–145)
Sodium: 136 mmol/L (ref 135–145)
Sodium: 137 mmol/L (ref 135–145)

## 2015-11-20 LAB — PREGNANCY, URINE: PREG TEST UR: NEGATIVE

## 2015-11-20 LAB — MAGNESIUM: MAGNESIUM: 1.4 mg/dL — AB (ref 1.7–2.4)

## 2015-11-20 MED ORDER — MAGNESIUM SULFATE 2 GM/50ML IV SOLN
2.0000 g | Freq: Once | INTRAVENOUS | Status: AC
Start: 2015-11-20 — End: 2015-11-20
  Administered 2015-11-20: 2 g via INTRAVENOUS
  Filled 2015-11-20: qty 50

## 2015-11-20 MED ORDER — KCL IN DEXTROSE-NACL 20-5-0.45 MEQ/L-%-% IV SOLN
INTRAVENOUS | Status: DC
  Administered 2015-11-20 (×3): via INTRAVENOUS
  Filled 2015-11-20 (×3): qty 1000

## 2015-11-20 MED ORDER — SODIUM CHLORIDE 0.9 % IV BOLUS (SEPSIS)
1000.0000 mL | Freq: Once | INTRAVENOUS | Status: AC
  Administered 2015-11-20: 1000 mL via INTRAVENOUS

## 2015-11-20 MED ORDER — INSULIN GLARGINE 100 UNIT/ML ~~LOC~~ SOLN
24.0000 [IU] | Freq: Every day | SUBCUTANEOUS | Status: DC
  Administered 2015-11-20: 24 [IU] via SUBCUTANEOUS
  Filled 2015-11-20: qty 0.24

## 2015-11-20 MED ORDER — PROMETHAZINE HCL 25 MG/ML IJ SOLN
12.5000 mg | Freq: Once | INTRAMUSCULAR | Status: AC
Start: 2015-11-20 — End: 2015-11-20
  Administered 2015-11-20: 12.5 mg via INTRAVENOUS
  Filled 2015-11-20: qty 1

## 2015-11-20 MED ORDER — POTASSIUM CHLORIDE 10 MEQ/100ML IV SOLN
10.0000 meq | INTRAVENOUS | Status: AC
  Administered 2015-11-20 (×3): 10 meq via INTRAVENOUS
  Filled 2015-11-20 (×3): qty 100

## 2015-11-20 MED ORDER — PROMETHAZINE HCL 25 MG/ML IJ SOLN
12.5000 mg | Freq: Once | INTRAMUSCULAR | Status: AC
  Administered 2015-11-20: 12.5 mg via INTRAVENOUS
  Filled 2015-11-20: qty 1

## 2015-11-20 MED ORDER — INSULIN ASPART 100 UNIT/ML ~~LOC~~ SOLN: 0.0000 [IU] | Freq: Three times a day (TID) | SUBCUTANEOUS | Status: DC

## 2015-11-20 MED ORDER — SODIUM CHLORIDE 0.9 % IV SOLN
INTRAVENOUS | Status: DC
  Administered 2015-11-20: 22:00:00 via INTRAVENOUS

## 2015-11-20 MED ORDER — INSULIN ASPART 100 UNIT/ML ~~LOC~~ SOLN: 0.0000 [IU] | Freq: Every day | SUBCUTANEOUS | Status: DC

## 2015-11-20 MED ORDER — DEXTROSE 10 % IV SOLN: INTRAVENOUS | Status: DC

## 2015-11-20 NOTE — Progress Notes (Signed)
PROGRESS NOTE    [name]  [id number] DOB: 06-17-1990 DOA: 11/18/2015 PCP: Stefanie Aly, FNP    Brief Narrative:  25 y/o that presented with DKA. Reportedly didn't f/u with endocrinologist as outpatient due to financial constraints.   Assessment & Plan:   Principal Problem:   DKA (diabetic ketoacidoses) ([place]) - on insulin gtt. CO2 18  as such will continue insulin gtt, D5 half-normal saline with potassium - Once bicarbonate normalizes and acidosis resolves will plan on transitioning to SQ insulin regimen - Will need to replace potassium while patient is on insulin gtt  Hypokalemia - replace K    Gastroparesis-continue Reglan Marijuana probably exacerbating the picture    Hematemesis  Hemoglobin at baseline, will recheck, no active hematemesis at this time, continue PPI     Diabetic gastroparesis ([place]) - will continue reglan  Hypomagnesemia-replete     DVT prophylaxis: early ambulation and scd's Code Status: Full Family Communication: none at bedside Disposition Plan: Continue stepdown, transfer to telemetry tomorrow   Consultants:   None   Procedures: None   Antimicrobials: None   Subjective: Complaining of severe abdominal pain and nausea  Objective: Vitals:   11/19/15 2336 11/20/15 0353 11/20/15 0400 11/20/15 0800  BP:   117/82   Pulse:   100   Resp:   16   Temp: 98.7 F (37.1 C) 99 F (37.2 C)  98.5 F (36.9 C)  TempSrc: Oral Oral  Oral  SpO2:   99%   Weight:      Height:        Intake/Output Summary (Last 24 hours) at 11/20/15 0831 Last data filed at 11/20/15 0600  Gross per 24 hour  Intake           8123.8 ml  Output             3760 ml  Net           4363.8 ml   Filed Weights   11/18/15 1717 11/18/15 2236  Weight: 54.4 kg (120 lb) 48.8 kg (107 lb 9.4 oz)    Examination:  General exam: Appears calm and comfortable, in nad. Respiratory system: Clear to auscultation. Respiratory effort normal, equal chest  rise. Cardiovascular system: S1 & S2 heard, RRR. No JVD, murmurs, rubs, gallops or clicks. No pedal edema. Gastrointestinal system: Abdomen is nondistended, soft and nontender. No organomegaly or masses felt. Normal bowel sounds heard. Central nervous system: Alert and oriented. No focal neurological deficits. Extremities: Symmetric 5 x 5 power. Skin: No rashes, lesions or ulcers, on limited exam. Psychiatry: Judgement and insight appear normal. Mood & affect appropriate.   Data Reviewed: I have personally reviewed following labs and imaging studies  CBC:  Recent Labs Lab 11/18/15 1853 11/19/15 0301  WBC 9.0 15.7*  HGB 13.6 11.1*  HCT 41.9 33.8*  MCV 92.9 92.6  PLT 449* [id number]   Basic Metabolic Panel:  Recent Labs Lab 11/19/15 1040 11/19/15 1556 11/19/15 2035 11/20/15 0000 11/20/15 0329  NA 136 135 137 137 136  K 3.3* 4.1 3.2* 3.3* 3.3*  CL 114* 111 113* 114* 114*  CO2 15* 14* 15* 18* 18*  GLUCOSE 165* 190* 178* 151* 154*  BUN 8 6 <5* <5* <5*  CREATININE 0.49 0.48 0.39* 0.43* 0.46  CALCIUM 8.0* 8.4* 8.1* 8.0* 7.7*  MG  --   --   --   --  1.4*   GFR: Estimated Creatinine Clearance: 82.1 mL/min (by C-G formula based on SCr of 0.46 mg/dL). Liver Function Tests:  Recent Labs Lab 11/18/15 1853  AST 24  ALT 15  ALKPHOS 109  BILITOT 1.8*  PROT 8.4*  ALBUMIN 4.5    Recent Labs Lab 11/18/15 1853  LIPASE 15   No results for input(s): AMMONIA in the last 168 hours. Coagulation Profile: No results for input(s): INR, PROTIME in the last 168 hours. Cardiac Enzymes:  Recent Labs Lab 11/18/15 1940  TROPONINI <0.03   BNP (last 3 results) No results for input(s): PROBNP in the last 8760 hours. HbA1C: No results for input(s): HGBA1C in the last 72 hours. CBG:  Recent Labs Lab 11/20/15 0349 11/20/15 0453 11/20/15 0548 11/20/15 0646 11/20/15 0804  GLUCAP 141* 149* 135* 126* 177*   Lipid Profile: No results for input(s): CHOL, HDL, LDLCALC, TRIG, CHOLHDL,  LDLDIRECT in the last 72 hours. Thyroid Function Tests: No results for input(s): TSH, T4TOTAL, FREET4, T3FREE, THYROIDAB in the last 72 hours. Anemia Panel: No results for input(s): VITAMINB12, FOLATE, FERRITIN, TIBC, IRON, RETICCTPCT in the last 72 hours. Sepsis Labs:  Recent Labs Lab 11/18/15 1902 11/18/15 2245 11/19/15 [id number]  LATICACIDVEN 1.2 2.4* 1.4    Recent Results (from the past 240 hour(s))  Urine culture     Status: None   Collection Time: 11/10/15  5:25 PM  Result Value Ref Range Status   Specimen Description URINE, RANDOM  Final   Special Requests NONE  Final   Culture NO GROWTH Performed at [facility]   Final   Report Status 11/12/2015 FINAL  Final         Radiology Studies: No results found.      Scheduled Meds: . mouth rinse  15 mL Mouth Rinse BID  . metoCLOPramide (REGLAN) injection  10 mg Intravenous Q6H  . mometasone-formoterol  2 puff Inhalation BID  . pantoprazole  40 mg Intravenous Q12H  . promethazine  12.5 mg Intravenous Once   Continuous Infusions: . dextrose 5 % and 0.45 % NaCl with KCl 20 mEq/L 150 mL/hr at 11/20/15 0737  . insulin (NOVOLIN-R) infusion 0.5 Units/hr (11/20/15 0813)     LOS: 2 days    Time spent: > 35 minutes  [name], MD [facility] Pager [phone]  If 7PM-7AM, please contact night-coverage [email or web address] Password [email or web address] 11/20/2015, 8:31 AM

## 2015-11-20 NOTE — Progress Notes (Signed)
Shift summary: Notified Dr. Allyson Sabal of persistent N&V; green bile. Abdomen soft, distended with no active bowel sounds heard. Complains of continous abd pain. Withdrawn, sleeps except to request medication for pain and nausea. Hemodynamics stable. Up to [facility].  KUB done.

## 2015-11-21 LAB — GLUCOSE, CAPILLARY
GLUCOSE-CAPILLARY: 103 mg/dL — AB (ref 65–99)
GLUCOSE-CAPILLARY: 143 mg/dL — AB (ref 65–99)
GLUCOSE-CAPILLARY: 152 mg/dL — AB (ref 65–99)
GLUCOSE-CAPILLARY: 160 mg/dL — AB (ref 65–99)
GLUCOSE-CAPILLARY: 162 mg/dL — AB (ref 65–99)
GLUCOSE-CAPILLARY: 193 mg/dL — AB (ref 65–99)
GLUCOSE-CAPILLARY: 208 mg/dL — AB (ref 65–99)
GLUCOSE-CAPILLARY: 214 mg/dL — AB (ref 65–99)
GLUCOSE-CAPILLARY: 237 mg/dL — AB (ref 65–99)
GLUCOSE-CAPILLARY: 74 mg/dL (ref 65–99)
GLUCOSE-CAPILLARY: 93 mg/dL (ref 65–99)
Glucose-Capillary: 54 mg/dL — ABNORMAL LOW (ref 65–99)
Glucose-Capillary: 63 mg/dL — ABNORMAL LOW (ref 65–99)
Glucose-Capillary: 74 mg/dL (ref 65–99)
Glucose-Capillary: 169 mg/dL — ABNORMAL HIGH (ref 65–99)
Glucose-Capillary: 144 mg/dL — ABNORMAL HIGH (ref 65–99)
Glucose-Capillary: 145 mg/dL — ABNORMAL HIGH (ref 65–99)
Glucose-Capillary: 170 mg/dL — ABNORMAL HIGH (ref 65–99)

## 2015-11-21 LAB — BASIC METABOLIC PANEL
ANION GAP: 10 (ref 5–15)
ANION GAP: 10 (ref 5–15)
BUN: <5 mg/dL — ABNORMAL LOW (ref 6–20)
BUN: <5 mg/dL — ABNORMAL LOW (ref 6–20)
CHLORIDE: 111 mmol/L (ref 101–111)
CHLORIDE: 106 mmol/L (ref 101–111)
CO2: 16 mmol/L — AB (ref 22–32)
CO2: 22 mmol/L (ref 22–32)
Calcium: 8.6 mg/dL — ABNORMAL LOW (ref 8.9–10.3)
Calcium: 8.4 mg/dL — ABNORMAL LOW (ref 8.9–10.3)
Creatinine, Ser: 0.53 mg/dL (ref 0.44–1.00)
Creatinine, Ser: 0.48 mg/dL (ref 0.44–1.00)
GFR calc non Af Amer: >60 mL/min (ref >60)
GFR calc non Af Amer: >60 mL/min (ref >60)
GLUCOSE: 203 mg/dL — AB (ref 65–99)
Glucose, Bld: 184 mg/dL — ABNORMAL HIGH (ref 65–99)
POTASSIUM: 4.5 mmol/L (ref 3.5–5.1)
Potassium: 4.1 mmol/L (ref 3.5–5.1)
SODIUM: 138 mmol/L (ref 135–145)
Sodium: 137 mmol/L (ref 135–145)

## 2015-11-21 LAB — BLOOD GAS, ARTERIAL
Acid-base deficit: 2 mmol/L (ref 0.0–2.0)
Bicarbonate: 21.5 mmol/L (ref 20.0–28.0)
DRAWN BY: 295031
FIO2: 21
O2 Saturation: 96.1 %
PATIENT TEMPERATURE: 98.6
PH ART: 7.41 (ref 7.350–7.450)
pCO2 arterial: 34.6 mmHg (ref 32.0–48.0)
pO2, Arterial: 80.5 mmHg — ABNORMAL LOW (ref 83.0–108.0)

## 2015-11-21 LAB — URINE MICROSCOPIC-ADD ON
Bacteria, UA: NONE SEEN
RBC / HPF: NONE SEEN RBC/hpf (ref 0–5)
Squamous Epithelial / LPF: NONE SEEN
WBC UA: NONE SEEN WBC/hpf (ref 0–5)

## 2015-11-21 LAB — COMPREHENSIVE METABOLIC PANEL
ALBUMIN: 3 g/dL — AB (ref 3.5–5.0)
ALK PHOS: 77 U/L (ref 38–126)
ALT: 11 U/L — AB (ref 14–54)
ANION GAP: 6 (ref 5–15)
AST: 18 U/L (ref 15–41)
BUN: <5 mg/dL — ABNORMAL LOW (ref 6–20)
CALCIUM: 7.9 mg/dL — AB (ref 8.9–10.3)
CO2: 20 mmol/L — AB (ref 22–32)
Chloride: 111 mmol/L (ref 101–111)
Creatinine, Ser: 0.43 mg/dL — ABNORMAL LOW (ref 0.44–1.00)
GFR calc Af Amer: >60 mL/min (ref >60)
GFR calc non Af Amer: >60 mL/min (ref >60)
GLUCOSE: 245 mg/dL — AB (ref 65–99)
Potassium: 2.8 mmol/L — ABNORMAL LOW (ref 3.5–5.1)
SODIUM: 137 mmol/L (ref 135–145)
Total Bilirubin: 1.1 mg/dL (ref 0.3–1.2)
Total Protein: 5.6 g/dL — ABNORMAL LOW (ref 6.5–8.1)

## 2015-11-21 LAB — CBC
HCT: 32.1 % — ABNORMAL LOW (ref 36.0–46.0)
HEMOGLOBIN: 11 g/dL — AB (ref 12.0–15.0)
MCH: 29.9 pg (ref 26.0–34.0)
MCHC: 34.3 g/dL (ref 30.0–36.0)
MCV: 87.2 fL (ref 78.0–100.0)
Platelets: 330 10*3/uL (ref 150–400)
RBC: 3.68 MIL/uL — AB (ref 3.87–5.11)
RDW: 13.1 % (ref 11.5–15.5)
WBC: 5.5 10*3/uL (ref 4.0–10.5)

## 2015-11-21 LAB — URINALYSIS, ROUTINE W REFLEX MICROSCOPIC
Bilirubin Urine: NEGATIVE
Glucose, UA: >1000 mg/dL — AB
KETONES UR: 40 mg/dL — AB
NITRITE: NEGATIVE
PH: 6.5 (ref 5.0–8.0)
Protein, ur: NEGATIVE mg/dL
SPECIFIC GRAVITY, URINE: 1.013 (ref 1.005–1.030)

## 2015-11-21 LAB — LIPASE, BLOOD: LIPASE: 16 U/L (ref 11–51)

## 2015-11-21 LAB — MAGNESIUM: MAGNESIUM: 1.6 mg/dL — AB (ref 1.7–2.4)

## 2015-11-21 MED ORDER — POTASSIUM CHLORIDE IN NACL 40-0.9 MEQ/L-% IV SOLN
INTRAVENOUS | Status: DC
  Administered 2015-11-21: 100 mL/h via INTRAVENOUS
  Filled 2015-11-21: qty 1000

## 2015-11-21 MED ORDER — MAGNESIUM SULFATE IN D5W 1-5 GM/100ML-% IV SOLN
1.0000 g | Freq: Once | INTRAVENOUS | Status: AC
  Administered 2015-11-21: 1 g via INTRAVENOUS
  Filled 2015-11-21: qty 100

## 2015-11-21 MED ORDER — SODIUM CHLORIDE 0.9 % IV SOLN
INTRAVENOUS | Status: DC
  Administered 2015-11-21: 1.3 [IU]/h via INTRAVENOUS
  Filled 2015-11-21: qty 2.5

## 2015-11-21 MED ORDER — FENTANYL CITRATE (PF) 100 MCG/2ML IJ SOLN
25.0000 ug | INTRAMUSCULAR | Status: DC | PRN
  Administered 2015-11-21 – 2015-11-22 (×4): 25 ug via INTRAVENOUS
  Filled 2015-11-21 (×4): qty 2

## 2015-11-21 MED ORDER — PROMETHAZINE HCL 25 MG/ML IJ SOLN
25.0000 mg | Freq: Once | INTRAMUSCULAR | Status: AC
  Administered 2015-11-21: 25 mg via INTRAVENOUS
  Filled 2015-11-21: qty 1

## 2015-11-21 MED ORDER — DEXTROSE-NACL 5-0.45 % IV SOLN
INTRAVENOUS | Status: DC
Start: 2015-11-21 — End: 2015-11-21

## 2015-11-21 MED ORDER — PROMETHAZINE HCL 25 MG/ML IJ SOLN
12.5000 mg | Freq: Once | INTRAMUSCULAR | Status: AC
  Administered 2015-11-21: 12.5 mg via INTRAVENOUS
  Filled 2015-11-21: qty 1

## 2015-11-21 MED ORDER — POTASSIUM CHLORIDE 10 MEQ/100ML IV SOLN
10.0000 meq | INTRAVENOUS | Status: AC
  Administered 2015-11-21 (×4): 10 meq via INTRAVENOUS
  Filled 2015-11-21 (×2): qty 100

## 2015-11-21 MED ORDER — INSULIN GLARGINE 100 UNIT/ML ~~LOC~~ SOLN
20.0000 [IU] | Freq: Every day | SUBCUTANEOUS | Status: DC
  Filled 2015-11-21: qty 0.2

## 2015-11-21 MED ORDER — SODIUM CHLORIDE 0.9 % IV SOLN
3.0000 mg/kg | Freq: Three times a day (TID) | INTRAVENOUS | Status: AC
  Administered 2015-11-21 – 2015-11-22 (×3): 145 mg via INTRAVENOUS
  Filled 2015-11-21 (×4): qty 2.9

## 2015-11-21 MED ORDER — INSULIN ASPART 100 UNIT/ML ~~LOC~~ SOLN
4.0000 [IU] | Freq: Once | SUBCUTANEOUS | Status: AC
  Administered 2015-11-21: 4 [IU] via SUBCUTANEOUS

## 2015-11-21 MED ORDER — DEXTROSE-NACL 5-0.45 % IV SOLN
INTRAVENOUS | Status: DC
  Administered 2015-11-21 (×2): via INTRAVENOUS

## 2015-11-21 MED ORDER — KCL IN DEXTROSE-NACL 20-5-0.45 MEQ/L-%-% IV SOLN: INTRAVENOUS | Status: DC

## 2015-11-21 MED ORDER — SODIUM CHLORIDE 0.9 % IV SOLN: INTRAVENOUS | Status: DC

## 2015-11-21 MED ORDER — SODIUM CHLORIDE 0.9 % IV SOLN
INTRAVENOUS | Status: DC
Start: 2015-11-21 — End: 2015-11-21

## 2015-11-21 NOTE — Progress Notes (Signed)
Inpatient Diabetes Program Recommendations  AACE/ADA: New Consensus Statement on Inpatient Glycemic Control (2015)  Target Ranges:  Prepandial:   less than 140 mg/dL      Peak postprandial:   less than 180 mg/dL (1-2 hours)      Critically ill patients:  140 - 180 mg/dL   Lab Results  Component Value Date   GLUCAP 169 (H) 11/21/2015   HGBA1C 10.3 (H) 11/10/2015   Results for Stefanie, Braun (MRN [id number]) as of 11/21/2015 14:25  Ref. Range 11/21/2015 08:27 11/21/2015 11:38 11/21/2015 11:47 11/21/2015 12:04 11/21/2015 13:19  Glucose-Capillary Latest Ref Range: 65 - 99 mg/dL 74 63 (L) 54 (L) 74 169 (H)   Review of Glycemic Control   Hypoglycemia likely d/t rapid acting insulin at 0400 and no po intake this am.  Transfer to floor cancelled with pt being nauseated.  Will talk with pt regarding her gastroparesis in am.   Recommendations: Agree with Lantus 20 units QHS Change Novolog to sensitive Q4H while no po intake.  Continue to follow. Thank you. Lorenda Peck, RD, LDN, CDE Inpatient Diabetes Coordinator [phone]

## 2015-11-21 NOTE — Progress Notes (Signed)
Hypoglycemic Event  CBG: 63, 54  Treatment: 15 GM carbohydrate snack  Symptoms: None  Follow-up CBG: Time:1204 CBG Result:74  Possible Reasons for Event: Inadequate meal intake  Comments/MD notified:[place]rol, MD    [name]

## 2015-11-21 NOTE — Progress Notes (Addendum)
PROGRESS NOTE    [name]  [id number] DOB: 03-20-1990 DOA: 11/18/2015 PCP: Vicenta Aly, FNP    Brief Narrative:   25 y.o. female with a past medical history of poorly controlled type 1 diabetes, severe diabetic gastroparesis who has been hospitalized numerous times in the past few months for recurrent episodes of DKA. Patient was last discharged from this facility on October 24. Admitted again with DKA.    Assessment & Plan:   Principal Problem:   DKA (diabetic ketoacidoses) ([facility])-did resolve however patient kept  vomiting has gone back into DKA this afternoon  bicarbonate 20>16 this afternoon, change   Lantus and sliding scale insulin back to insulin gtt  continue aggressive IV fluids Previous hemoglobin A1c 10.3 on 11/10/15 Recurrent DKA probably secondary to noncompliance , see diabetes coordinator note from 10/31   Hypokalemia Continue to replace, check magnesium   Gastroparesis/ cyclic vomiting syndrome-continue Reglan scheduled, continue prn Zofran Marijuana probably exacerbating the picture Not improving , will start erythromycin IV for 24 hrs and monitor qtc     Hematemesis-resolved  Hemoglobin at baseline, of around 11, continue PPI     Diabetic gastroparesis ([place]) - will continue reglan  Hypomagnesemia-replete     DVT prophylaxis: early ambulation and scd's Code Status: Full Family Communication: none at bedside Disposition Plan: continue stepdown    Consultants:   None   Procedures: None   Antimicrobials: None   Subjective: Nausea vomiting worse this afternoon  Objective: Vitals:   11/21/15 0000 11/21/15 0030 11/21/15 0300 11/21/15 0400  BP: 112/72   118/73  Pulse:      Resp: 16   (!) 21  Temp:  98.8 F (37.1 C) 98.6 F (37 C)   TempSrc:  Oral Oral   SpO2: 100%   99%  Weight:      Height:        Intake/Output Summary (Last 24 hours) at 11/21/15 0903 Last data filed at 11/21/15 0230  Gross per 24 hour  Intake            1625.3 ml  Output             4775 ml  Net          -3149.7 ml   Filed Weights   11/18/15 1717 11/18/15 2236  Weight: 54.4 kg (120 lb) 48.8 kg (107 lb 9.4 oz)    Examination:  General exam: Appears calm and comfortable, in nad. Respiratory system: Clear to auscultation. Respiratory effort normal, equal chest rise. Cardiovascular system: S1 & S2 heard, RRR. No JVD, murmurs, rubs, gallops or clicks. No pedal edema. Gastrointestinal system: Abdomen is nondistended, soft and nontender. No organomegaly or masses felt. Normal bowel sounds heard. Central nervous system: Alert and oriented. No focal neurological deficits. Extremities: Symmetric 5 x 5 power. Skin: No rashes, lesions or ulcers, on limited exam. Psychiatry: Judgement and insight appear normal. Mood & affect appropriate.   Data Reviewed: I have personally reviewed following labs and imaging studies  CBC:  Recent Labs Lab 11/18/15 1853 11/19/15 0301 11/20/15 0329 11/21/15 0313  WBC 9.0 15.7* 8.0 5.5  HGB 13.6 11.1* 10.9* 11.0*  HCT 41.9 33.8* 32.8* 32.1*  MCV 92.9 92.6 90.9 87.2  PLT 449* 397 345 [id number]   Basic Metabolic Panel:  Recent Labs Lab 11/20/15 0000 11/20/15 0329 11/20/15 1157 11/20/15 1947 11/21/15 0313  NA 137 136 139 136 137  K 3.3* 3.3* 3.2* 3.5 2.8*  CL 114* 114* 111 108 111  CO2 18* 18* 16*  20* 20*  GLUCOSE 151* 154* 154* 203* 245*  BUN <5* <5* <5* <5* <5*  CREATININE 0.43* 0.46 0.60 0.40* 0.43*  CALCIUM 8.0* 7.7* 8.5* 8.5* 7.9*  MG  --  1.4*  --   --   --    GFR: Estimated Creatinine Clearance: 82.1 mL/min (by C-G formula based on SCr of 0.43 mg/dL (L)). Liver Function Tests:  Recent Labs Lab 11/18/15 1853 11/21/15 0313  AST 24 18  ALT 15 11*  ALKPHOS 109 77  BILITOT 1.8* 1.1  PROT 8.4* 5.6*  ALBUMIN 4.5 3.0*    Recent Labs Lab 11/18/15 1853  LIPASE 15   No results for input(s): AMMONIA in the last 168 hours. Coagulation Profile: No results for input(s): INR,  PROTIME in the last 168 hours. Cardiac Enzymes:  Recent Labs Lab 11/18/15 1940  TROPONINI <0.03   BNP (last 3 results) No results for input(s): PROBNP in the last 8760 hours. HbA1C: No results for input(s): HGBA1C in the last 72 hours. CBG:  Recent Labs Lab 11/21/15 0157 11/21/15 0304 11/21/15 0508 11/21/15 0657 11/21/15 0827  GLUCAP 214* 237* 93 103* 74   Lipid Profile: No results for input(s): CHOL, HDL, LDLCALC, TRIG, CHOLHDL, LDLDIRECT in the last 72 hours. Thyroid Function Tests: No results for input(s): TSH, T4TOTAL, FREET4, T3FREE, THYROIDAB in the last 72 hours. Anemia Panel: No results for input(s): VITAMINB12, FOLATE, FERRITIN, TIBC, IRON, RETICCTPCT in the last 72 hours. Sepsis Labs:  Recent Labs Lab 11/18/15 1902 11/18/15 2245 11/19/15 [id number]  LATICACIDVEN 1.2 2.4* 1.4    No results found for this or any previous visit (from the past 240 hour(s)).       Radiology Studies: Dg Abd 1 View  Result Date: 11/20/2015 CLINICAL DATA:  Vomiting, DKA EXAM: ABDOMEN - 1 VIEW COMPARISON:  11/10/2015 FINDINGS: Surgical clips in the upper abdomen. Nonspecific, nonobstructed gas pattern. No pathologic calcifications. IMPRESSION: Nonspecific nonobstructed gas pattern Electronically Signed   By: Donavan Foil M.D.   On: 11/20/2015 17:26        Scheduled Meds: . insulin aspart  0-5 Units Subcutaneous QHS  . insulin aspart  0-9 Units Subcutaneous TID WC  . insulin glargine  24 Units Subcutaneous QHS  . mouth rinse  15 mL Mouth Rinse BID  . metoCLOPramide (REGLAN) injection  10 mg Intravenous Q6H  . mometasone-formoterol  2 puff Inhalation BID  . pantoprazole  40 mg Intravenous Q12H   Continuous Infusions: . 0.9 % NaCl with KCl 40 mEq / L 100 mL/hr (11/21/15 0829)  . insulin (NOVOLIN-R) infusion Stopped (11/21/15 0000)     LOS: 3 days    Time spent: > 35 minutes  Dequincy Born, MD [facility] Pager [phone]  If 7PM-7AM, please contact  night-coverage [email or web address] Password [email or web address] 11/21/2015, 9:03 AM

## 2015-11-22 DIAGNOSIS — E1340 Other specified diabetes mellitus with diabetic neuropathy, unspecified: Secondary | ICD-10-CM

## 2015-11-22 DIAGNOSIS — E876 Hypokalemia: Secondary | ICD-10-CM

## 2015-11-22 DIAGNOSIS — Z794 Long term (current) use of insulin: Secondary | ICD-10-CM

## 2015-11-22 DIAGNOSIS — E1365 Other specified diabetes mellitus with hyperglycemia: Secondary | ICD-10-CM

## 2015-11-22 DIAGNOSIS — K3184 Gastroparesis: Secondary | ICD-10-CM

## 2015-11-22 DIAGNOSIS — E1143 Type 2 diabetes mellitus with diabetic autonomic (poly)neuropathy: Secondary | ICD-10-CM

## 2015-11-22 DIAGNOSIS — E1010 Type 1 diabetes mellitus with ketoacidosis without coma: Principal | ICD-10-CM

## 2015-11-22 LAB — GLUCOSE, CAPILLARY
GLUCOSE-CAPILLARY: 200 mg/dL — AB (ref 65–99)
GLUCOSE-CAPILLARY: 156 mg/dL — AB (ref 65–99)
GLUCOSE-CAPILLARY: 135 mg/dL — AB (ref 65–99)
GLUCOSE-CAPILLARY: 214 mg/dL — AB (ref 65–99)
Glucose-Capillary: 104 mg/dL — ABNORMAL HIGH (ref 65–99)
Glucose-Capillary: 126 mg/dL — ABNORMAL HIGH (ref 65–99)
Glucose-Capillary: 137 mg/dL — ABNORMAL HIGH (ref 65–99)
Glucose-Capillary: 147 mg/dL — ABNORMAL HIGH (ref 65–99)
Glucose-Capillary: 164 mg/dL — ABNORMAL HIGH (ref 65–99)
Glucose-Capillary: 165 mg/dL — ABNORMAL HIGH (ref 65–99)
Glucose-Capillary: 233 mg/dL — ABNORMAL HIGH (ref 65–99)
Glucose-Capillary: 233 mg/dL — ABNORMAL HIGH (ref 65–99)
Glucose-Capillary: 243 mg/dL — ABNORMAL HIGH (ref 65–99)
Glucose-Capillary: 304 mg/dL — ABNORMAL HIGH (ref 65–99)

## 2015-11-22 LAB — BASIC METABOLIC PANEL
ANION GAP: 9 (ref 5–15)
ANION GAP: 9 (ref 5–15)
BUN: <5 mg/dL — ABNORMAL LOW (ref 6–20)
CALCIUM: 8.7 mg/dL — AB (ref 8.9–10.3)
CHLORIDE: 106 mmol/L (ref 101–111)
CO2: 23 mmol/L (ref 22–32)
CO2: 22 mmol/L (ref 22–32)
CREATININE: 0.37 mg/dL — AB (ref 0.44–1.00)
Calcium: 8.8 mg/dL — ABNORMAL LOW (ref 8.9–10.3)
Chloride: 108 mmol/L (ref 101–111)
Creatinine, Ser: 0.45 mg/dL (ref 0.44–1.00)
GFR calc Af Amer: >60 mL/min (ref >60)
GFR calc non Af Amer: >60 mL/min (ref >60)
GFR calc non Af Amer: >60 mL/min (ref >60)
GLUCOSE: 258 mg/dL — AB (ref 65–99)
Glucose, Bld: 190 mg/dL — ABNORMAL HIGH (ref 65–99)
POTASSIUM: 3.2 mmol/L — AB (ref 3.5–5.1)
Potassium: 3.1 mmol/L — ABNORMAL LOW (ref 3.5–5.1)
SODIUM: 138 mmol/L (ref 135–145)
Sodium: 139 mmol/L (ref 135–145)

## 2015-11-22 MED ORDER — SODIUM CHLORIDE 0.9 % IV SOLN
INTRAVENOUS | Status: DC
  Administered 2015-11-22 – 2015-11-23 (×2): via INTRAVENOUS

## 2015-11-22 MED ORDER — FENTANYL CITRATE (PF) 100 MCG/2ML IJ SOLN
25.0000 ug | INTRAMUSCULAR | Status: DC | PRN
  Administered 2015-11-22 – 2015-11-23 (×4): 25 ug via INTRAVENOUS
  Filled 2015-11-22 (×4): qty 2

## 2015-11-22 MED ORDER — INSULIN GLARGINE 100 UNIT/ML ~~LOC~~ SOLN
5.0000 [IU] | Freq: Once | SUBCUTANEOUS | Status: AC
  Administered 2015-11-22: 5 [IU] via SUBCUTANEOUS
  Filled 2015-11-22: qty 0.05

## 2015-11-22 MED ORDER — INSULIN GLARGINE 100 UNIT/ML ~~LOC~~ SOLN
20.0000 [IU] | Freq: Every day | SUBCUTANEOUS | Status: DC
  Administered 2015-11-23: 20 [IU] via SUBCUTANEOUS
  Filled 2015-11-22 (×2): qty 0.2

## 2015-11-22 MED ORDER — INSULIN ASPART 100 UNIT/ML ~~LOC~~ SOLN
0.0000 [IU] | Freq: Three times a day (TID) | SUBCUTANEOUS | Status: DC
  Administered 2015-11-22: 2 [IU] via SUBCUTANEOUS
  Administered 2015-11-22: 11 [IU] via SUBCUTANEOUS
  Administered 2015-11-23: 5 [IU] via SUBCUTANEOUS

## 2015-11-22 MED ORDER — POTASSIUM CHLORIDE CRYS ER 20 MEQ PO TBCR
40.0000 meq | EXTENDED_RELEASE_TABLET | Freq: Once | ORAL | Status: AC
  Administered 2015-11-22: 40 meq via ORAL
  Filled 2015-11-22: qty 2

## 2015-11-22 MED ORDER — INSULIN GLARGINE 100 UNIT/ML ~~LOC~~ SOLN: 24.0000 [IU] | Freq: Every day | SUBCUTANEOUS | Status: DC

## 2015-11-22 MED ORDER — PROMETHAZINE HCL 25 MG/ML IJ SOLN
25.0000 mg | Freq: Four times a day (QID) | INTRAMUSCULAR | Status: DC | PRN
  Administered 2015-11-22 (×2): 25 mg via INTRAVENOUS
  Filled 2015-11-22 (×3): qty 1

## 2015-11-22 NOTE — Progress Notes (Signed)
Date:  November 22, 2015 Chart reviewed for concurrent status and case management needs. Will continue to follow the patient for status change: remains on iv insulin drip Discharge Planning: following for needs Expected discharge date: [id number] Rhonda Davis, BSN, [place], [place]

## 2015-11-22 NOTE — Hospital Discharge Follow-Up (Signed)
This Case Manager received communication from Livia Snellen, RN CM to determine if patient qualifies for [facility]. Discussed with Dr. Jarold Song who indicated patient does not qualify for [facility]. Hospital follow-up appointment needed. Also discussed with Velva Harman, RN CM who also indicated patient needing hospital follow-up appointment. Spoke with patient. Discussed the medical management and resources (including onsite pharmacy, Financial Counseling) available at [facility] and [facility]. Patient appreciative of information and agreeable to appointment being scheduled. Appointment scheduled for 11/28/15 at 1100. AVS updated. Velva Harman, RN CM also updated.

## 2015-11-22 NOTE — Progress Notes (Addendum)
[name], female   DOB: 29-Sep-1990, 25 y.o.   MRN: [id number]  PROGRESS NOTE    [name]  IHK:[id number] DOB: Jan 10, 1991 DOA: 11/18/2015  PCP: Vicenta Aly, FNP   Brief Narrative:  25 y.o.femalewith a past medical history of poorly controlled type 1 diabetes, severe diabetic gastroparesis multiple admissions for recurrent episodes of DKA. She was admitted to hospital for DKA this time as well. She required admission to stepdown unit because she is on insulin drip.   Assessment & Plan:     Principal Problem: DKA (diabetic ketoacidoses) without coma associated with type 1 DM ([place]) - Diabetic ketoacidosis criteria met on the admission - Patient required admission to stepdown unit because she required insulin drip per DKA protocol - Blood work this morning shows normal CO2 and closed anion gap - We will transition to subcutaneous insulin, will place order for Lantus 5 units first dose now and sliding scale insulin. Insulin drip will be stopped 2 hours per DKA protocol - We will transfer to medical floor once patient off of insulin drip  Active problems: Uncontrolled type 1 diabetes mellitus with diabetic neuropathy with long-term insulin use - Last A1c in October 2017 was 10.3 indicating poor glycemic control - Appreciate diabetic coordinator assessment and recommendation  - We will resume Lantus at bedtime 20 units and sliding scale insulin   Diabetic gastroparesis - Secondary to uncontrolled diabetes - No acute findings on abdominal x-ray  - Continue Reglan 10 mg every 6 hours IV   Hypokalemia - Due to GI losses  - Supplemented  - Check BMP and magnesium tomorrow morning    DVT prophylaxis: SCDs bilaterally Code Status: full code  Family Communication: No family at the bedside Disposition Plan: We will transfer to medical floor once patient off of insulin drip   Consultants:   Diabetic coordinator  Procedures:    None  Antimicrobials:   None   Subjective: No overnight events.  Objective: Vitals:   11/22/15 0000 11/22/15 0317 11/22/15 0400 11/22/15 0800  BP: (!) 150/105  107/78 101/75  Pulse:      Resp: 10  18 19   Temp:  98.4 F (36.9 C)  98.6 F (37 C)  TempSrc:  Oral  Oral  SpO2: 99%  100% 100%  Weight:      Height:        Intake/Output Summary (Last 24 hours) at 11/22/15 1012 Last data filed at 11/22/15 0821  Gross per 24 hour  Intake          3889.94 ml  Output             2350 ml  Net          1539.94 ml   Filed Weights   11/18/15 1717 11/18/15 2236  Weight: 54.4 kg (120 lb) 48.8 kg (107 lb 9.4 oz)    Examination:  General exam: Appears calm and comfortable  Respiratory system: Clear to auscultation. Respiratory effort normal. Cardiovascular system: S1 & S2 heard, RRR. No JVD, murmurs, rubs, gallops or clicks. No pedal edema. Gastrointestinal system: Abdomen is nondistended, soft and nontender. No organomegaly or masses felt. Normal bowel sounds heard. Central nervous system: Alert and oriented. No focal neurological deficits. Extremities: Symmetric 5 x 5 power. Skin: No rashes, lesions or ulcers Psychiatry: Judgement and insight appear normal. Mood & affect appropriate.   Data Reviewed: I have personally reviewed following labs and imaging studies  CBC:  Recent Labs Lab 11/18/15 1853 11/19/15 0301 11/20/15 0329 11/21/15  0313  WBC 9.0 15.7* 8.0 5.5  HGB 13.6 11.1* 10.9* 11.0*  HCT 41.9 33.8* 32.8* 32.1*  MCV 92.9 92.6 90.9 87.2  PLT 449* 397 345 353   Basic Metabolic Panel:  Recent Labs Lab 11/20/15 0329  11/21/15 0313 11/21/15 1344 11/21/15 1802 11/22/15 0213 11/22/15 0713  NA 136  < > 137 137 138 138 139  K 3.3*  < > 2.8* 4.5 4.1 3.2* 3.1*  CL 114*  < > 111 111 106 106 108  CO2 18*  < > 20* 16* 22 23 22   GLUCOSE 154*  < > 245* 203* 184* 190* 258*  BUN <5*  < > <5* <5* <5* <5* <5*  CREATININE 0.46  < > 0.43* 0.53 0.48 0.37* 0.45   CALCIUM 7.7*  < > 7.9* 8.6* 8.4* 8.8* 8.7*  MG 1.4*  --  1.6*  --   --   --   --   < > = values in this interval not displayed. GFR: Estimated Creatinine Clearance: 82.1 mL/min (by C-G formula based on SCr of 0.45 mg/dL). Liver Function Tests:  Recent Labs Lab 11/18/15 1853 11/21/15 0313  AST 24 18  ALT 15 11*  ALKPHOS 109 77  BILITOT 1.8* 1.1  PROT 8.4* 5.6*  ALBUMIN 4.5 3.0*    Recent Labs Lab 11/18/15 1853 11/21/15 0907  LIPASE 15 16   No results for input(s): AMMONIA in the last 168 hours. Coagulation Profile: No results for input(s): INR, PROTIME in the last 168 hours. Cardiac Enzymes:  Recent Labs Lab 11/18/15 1940  TROPONINI <0.03   BNP (last 3 results) No results for input(s): PROBNP in the last 8760 hours. HbA1C: No results for input(s): HGBA1C in the last 72 hours. CBG:  Recent Labs Lab 11/22/15 0550 11/22/15 0645 11/22/15 0746 11/22/15 0848 11/22/15 1006  GLUCAP 137* 233* 243* 165* 233*   Lipid Profile: No results for input(s): CHOL, HDL, LDLCALC, TRIG, CHOLHDL, LDLDIRECT in the last 72 hours. Thyroid Function Tests: No results for input(s): TSH, T4TOTAL, FREET4, T3FREE, THYROIDAB in the last 72 hours. Anemia Panel: No results for input(s): VITAMINB12, FOLATE, FERRITIN, TIBC, IRON, RETICCTPCT in the last 72 hours. Urine analysis:    Component Value Date/Time   COLORURINE YELLOW 11/21/2015 1546   APPEARANCEUR CLOUDY (A) 11/21/2015 1546   LABSPEC 1.013 11/21/2015 1546   PHURINE 6.5 11/21/2015 1546   GLUCOSEU >1000 (A) 11/21/2015 1546   GLUCOSEU >=1000 11/07/2012 1205   HGBUR TRACE (A) 11/21/2015 1546   BILIRUBINUR NEGATIVE 11/21/2015 1546   KETONESUR 40 (A) 11/21/2015 1546   PROTEINUR NEGATIVE 11/21/2015 1546   UROBILINOGEN 0.2 11/24/2014 1045   NITRITE NEGATIVE 11/21/2015 1546   LEUKOCYTESUR TRACE (A) 11/21/2015 1546   Sepsis Labs: @LABRCNTIP (procalcitonin:4,lacticidven:4)   )No results found for this or any previous visit (from  the past 240 hour(s)).    Radiology Studies: Dg Abd 1 View Result Date: 11/20/2015  Nonspecific nonobstructed gas pattern Electronically Signed   By: Donavan Foil M.D.   On: 11/20/2015 17:26     Scheduled Meds: . insulin aspart  0-15 Units Subcutaneous TID WC  . insulin glargine  20 Units Subcutaneous QHS  . mouth rinse  15 mL Mouth Rinse BID  . metoCLOPramide (REGLAN) injection  10 mg Intravenous Q6H  . mometasone-formoterol  2 puff Inhalation BID  . pantoprazole  40 mg Intravenous Q12H   Continuous Infusions: . dextrose 5 % and 0.45% NaCl 150 mL/hr at 11/21/15 2353  . insulin (NOVOLIN-R) infusion 2.1 Units/hr (  11/22/15 0849)     LOS: 4 days    Time spent: 25 minutes  Greater than 50% of the time spent on counseling and coordinating the care.   Leisa Lenz, MD [facility] Pager [phone]  If 7PM-7AM, please contact night-coverage [email or web address] Password [email or web address] 11/22/2015, 10:12 AM

## 2015-11-23 DIAGNOSIS — E0810 Diabetes mellitus due to underlying condition with ketoacidosis without coma: Secondary | ICD-10-CM

## 2015-11-23 LAB — BASIC METABOLIC PANEL
Anion gap: 7 (ref 5–15)
CALCIUM: 8.4 mg/dL — AB (ref 8.9–10.3)
CO2: 22 mmol/L (ref 22–32)
CREATININE: 0.54 mg/dL (ref 0.44–1.00)
Chloride: 109 mmol/L (ref 101–111)
GFR calc non Af Amer: >60 mL/min (ref >60)
Glucose, Bld: 289 mg/dL — ABNORMAL HIGH (ref 65–99)
Potassium: 2.9 mmol/L — ABNORMAL LOW (ref 3.5–5.1)
SODIUM: 138 mmol/L (ref 135–145)

## 2015-11-23 LAB — GLUCOSE, CAPILLARY: GLUCOSE-CAPILLARY: 241 mg/dL — AB (ref 65–99)

## 2015-11-23 LAB — MAGNESIUM: Magnesium: 1.6 mg/dL — ABNORMAL LOW (ref 1.7–2.4)

## 2015-11-23 MED ORDER — BASAGLAR KWIKPEN 100 UNIT/ML ~~LOC~~ SOPN: 20.0000 [IU] | PEN_INJECTOR | Freq: Every day | SUBCUTANEOUS | 1 refills | Status: DC

## 2015-11-23 MED ORDER — POTASSIUM CHLORIDE CRYS ER 20 MEQ PO TBCR
40.0000 meq | EXTENDED_RELEASE_TABLET | Freq: Once | ORAL | Status: AC
Start: 2015-11-23 — End: 2015-11-23
  Administered 2015-11-23: 40 meq via ORAL
  Filled 2015-11-23: qty 2

## 2015-11-23 MED ORDER — POTASSIUM CHLORIDE CRYS ER 20 MEQ PO TBCR: 20.0000 meq | EXTENDED_RELEASE_TABLET | Freq: Every day | ORAL | 0 refills | Status: DC

## 2015-11-23 NOTE — Discharge Instructions (Signed)
Blood Glucose Monitoring, Adult °Monitoring your blood glucose (also know as blood sugar) helps you to manage your diabetes. It also helps you and your health care provider monitor your diabetes and determine how well your treatment plan is working. °WHY SHOULD YOU MONITOR YOUR BLOOD GLUCOSE? °· It can help you understand how food, exercise, and medicine affect your blood glucose. °· It allows you to know what your blood glucose is at any given moment. You can quickly tell if you are having low blood glucose (hypoglycemia) or high blood glucose (hyperglycemia). °· It can help you and your health care provider know how to adjust your medicines. °· It can help you understand how to manage an illness or adjust medicine for exercise. °WHEN SHOULD YOU TEST? °Your health care provider will help you decide how often you should check your blood glucose. This may depend on the type of diabetes you have, your diabetes control, or the types of medicines you are taking. Be sure to write down all of your blood glucose readings so that this information can be reviewed with your health care provider. See below for examples of testing times that your health care provider may suggest. °Type 1 Diabetes °· Test at least 2 times per day if your diabetes is well controlled, if you are using an insulin pump, or if you perform multiple daily injections. °· If your diabetes is not well controlled or if you are sick, you may need to test more often. °· It is a good idea to also test: °¨ Before every insulin injection. °¨ Before and after exercise. °¨ Between meals and 2 hours after a meal. °¨ Occasionally between 2:00 a.m. and 3:00 a.m. °Type 2 Diabetes °· If you are taking insulin, test at least 2 times per day. However, it is best to test before every insulin injection. °· If you take medicines by mouth (orally), test 2 times a day. °· If you are on a controlled diet, test once a day. °· If your diabetes is not well controlled or if you  are sick, you may need to monitor more often. °HOW TO MONITOR YOUR BLOOD GLUCOSE °Supplies Needed °· Blood glucose meter. °· Test strips for your meter. Each meter has its own strips. You must use the strips that go with your own meter. °· A pricking needle (lancet). °· A device that holds the lancet (lancing device). °· A journal or log book to write down your results. °Procedure °· Wash your hands with soap and water. Alcohol is not preferred. °· Prick the side of your finger (not the tip) with the lancet. °· Gently milk the finger until a small drop of blood appears. °· Follow the instructions that come with your meter for inserting the test strip, applying blood to the strip, and using your blood glucose meter. °Other Areas to Get Blood for Testing °Some meters allow you to use other areas of your body (other than your finger) to test your blood. These areas are called alternative sites. The most common alternative sites are: °· The forearm. °· The thigh. °· The back area of the lower leg. °· The palm of the hand. °The blood flow in these areas is slower. Therefore, the blood glucose values you get may be delayed, and the numbers are different from what you would get from your fingers. Do not use alternative sites if you think you are having hypoglycemia. Your reading will not be accurate. Always use a finger if you are   having hypoglycemia. Also, if you cannot feel your lows (hypoglycemia unawareness), always use your fingers for your blood glucose checks. ADDITIONAL TIPS FOR GLUCOSE MONITORING  Do not reuse lancets.  Always carry your supplies with you.  All blood glucose meters have a 24-hour "hotline" number to call if you have questions or need help.  Adjust (calibrate) your blood glucose meter with a control solution after finishing a few boxes of strips. BLOOD GLUCOSE RECORD KEEPING It is a good idea to keep a daily record or log of your blood glucose readings. Most glucose meters, if not all,  keep your glucose records stored in the meter. Some meters come with the ability to download your records to your home computer. Keeping a record of your blood glucose readings is especially helpful if you are wanting to look for patterns. Make notes to go along with the blood glucose readings because you might forget what happened at that exact time. Keeping good records helps you and your health care provider to work together to achieve good diabetes management.    This information is not intended to replace advice given to you by your health care provider. Make sure you discuss any questions you have with your health care provider.   Document Released: 01/08/2003 Document Revised: 01/26/2014 Document Reviewed: 05/30/2012 Elsevier Interactive Patient Education 2016 [employer].   Diabetes and Exercise Diabetes mellitus is a common, chronic disease in which a person's blood sugar (glucose) levels are often too high. Getting exercise on a regular basis is an important part of diabetes treatment. WHY SHOULD I EXERCISE REGULARLY IF I HAVE DIABETES? Exercising regularly provides many benefits for people who have diabetes. Some of these benefits include:  Lowering your blood glucose level.  Maintaining a healthy body weight and structure.  Reducing your risk of heart disease by:  Lowering your LDL cholesterol levels. LDL is sometimes called bad cholesterol.  Lowering your triglyceride levels.  Raising your HDL cholesterol levels. HDL is sometimes called good cholesterol.  Decreasing your blood pressure.  Lowering your hemoglobin A1C levels. A1C is a blood test that determines your average blood glucose level over a 14-month period. Doing resistance exercises on a regular basis can help to lower these levels.  Improving your body's ability to use insulin and glucose. WHAT TYPES OF EXERCISE ARE RECOMMENDED FOR PEOPLE WHO HAVE DIABETES? A combination of resistance exercises and aerobic  exercise is recommended for people who have diabetes. Resistance exercises are those that you do with free weights or weight machines. Aerobic exercise includes any exercise that raises your heart rate and breathing rate for a sustained amount of time. This can include activities such as:  Brisk walking.  Water or dry-land aerobics.  Bicycling or riding a stationary bike.  Jogging.  Dancing.  Hiking.  Moderate to vigorous gardening. Ask your health care provider what types of exercise are safe for you. HOW LONG AND HOW OFTEN SHOULD I EXERCISE? The American Diabetes Association and the [place] Department of Health recommend the following:  Children who have diabetes or are at risk of developing diabetes (have prediabetes) should get 1 hour or more of exercise every day.  Adults who have diabetes should exercise with moderate intensity for 150 minutes or more per week. Moderate-intensity exercise is any exercise that raises your heart rate to 50-70% of your maximum heart rate. Ask your health care provider how to calculate this.  Adults who have diabetes should spread exercise over at least 3 days per week,  but they should not go more than 2 days in a row without exercising.  Adults who have diabetes should do resistance exercise at least 2 days per week.  Adults who have diabetes should avoid sitting for more than 90 minutes at a time. WHAT SHOULD I DO BEFORE I START AN EXERCISE PROGRAM?  Talk with your health care provider before you start a new exercise program. If you have, or are at risk for, certain medical conditions, you may need to have a physical exam and testing. Testing may include:  A chest X-ray.  An electrocardiogram (ECG). This test checks the electrical functioning of your heart.  Blood tests.  If you have type 1 diabetes, talk with your health care provider about managing your blood glucose levels with insulin before, during, and after exercise.  Understand that  exercise affects blood glucose levels differently depending on how long you exercise and depending on other factors such as whether you are sick. Be ready to treat high blood glucose (hyperglycemia) and low blood glucose (hypoglycemia) right away if either occurs during or after exercise.  Talk with your health care provider about diabetes-related problems that can occur during exercise. Your health care provider can help you to decide on an exercise plan that allows you to avoid these problems. This plan might include instructions about choosing the proper footwear for exercise and staying well hydrated during and after exercise. HOW CAN I MANAGE MY BLOOD GLUCOSE LEVEL WHILE I EXERCISE?  Eat 1-3 hours before you exercise.  Check your blood glucose level immediately before and after exercise.  Do not start exercising if:  Your blood glucose is more than 250 mg/dL andyou do not feel well.  You have ketones in your urine.  Your blood glucose is less than 100 mg/dL.  If you do not feel well while exercising, take a break and check your blood glucose.  Stop exercising if you find that your blood glucose has dropped below 100 mg/dL. If this occurs, do the following:  Eat a 15-gram to 20-gram carbohydrate-rich snack.  Recheck your blood glucose level.  If your blood glucose level does not rise above 100 mg/dL, have another 15-gram to 20-gram carbohydrate snack.  Stop exercising if youfindthat your blood glucose has risen above 250 mg/dL while exercising.  Do not go on with your workout until:  Your blood glucose level rises above 100 mg/dL if it was below this level.  Your blood glucose level drops below 250 mg/dL if it was above this level.  If you take insulin, you may need to alter your insulin schedule on the days that you exercise. This depends on how your blood glucose responds to exercise. Ask your health care provider how to do this.  Drink fluids during and after exercise  to avoid dehydration. For exercise that lasts a long time, use a sports drink to maintain your glucose level. SEEK MEDICAL CARE IF:  You have stopped exercising because of hypoglycemia and your blood glucose does not rise above 100 mg/dL after you have two 15-gram to 20-gram carbohydrate-rich snacks.  You notice a loss of sensation in your feet during or after exercise.  You have increased numbness, tingling, or pins-and-needles sensations after exercise.  You have a fast, irregular heartbeat (palpitations) during or after exercise.  Your exercise tolerance gets worse.  You have dizzy spells or feel faint for brief periods during or after exercise. SEEK IMMEDIATE MEDICAL CARE IF:  You have chest pain during or after  exercise.  You pass out (lose consciousness) during or after exercise.  You have the following in addition to hyperglycemia:  Fatigue.  Dry or flushed skin.  Difficulty breathing.  Confusion.  Nausea, vomiting, or pain in the abdomen.  Fruity-smelling breath.   This information is not intended to replace advice given to you by your health care provider. Make sure you discuss any questions you have with your health care provider.   Document Released: 01/05/2005 Document Revised: 09/26/2014 Document Reviewed: 03/06/2014 Elsevier Interactive Patient Education 2016 [employer].  Diabetic Ketoacidosis Diabetic ketoacidosis is a life-threatening complication of diabetes. If it is not treated, it can cause severe dehydration and organ damage and can lead to a coma or death. CAUSES This condition develops when there is not enough of the hormone insulin in the body. Insulin helps the body to break down sugar for energy. Without insulin, the body cannot break down sugar, so it breaks down fats instead. This leads to the production of acids that are called ketones. Ketones are poisonous at high levels. This condition can be triggered by:  Stress on the body that is  brought on by an illness.  Medicines that raise blood glucose levels.  Not taking diabetes medicine. SYMPTOMS Symptoms of this condition include:  Fatigue.  Weight loss.  Excessive thirst.  Light-headedness.  Fruity or sweet-smelling breath.  Excessive urination.  Vision changes.  Confusion or irritability.  Nausea.  Vomiting.  Rapid breathing.  Abdominal pain.  Feeling flushed. DIAGNOSIS This condition is diagnosed based on a medical history, a physical exam, and blood tests. You may also have a urine test that checks for ketones. TREATMENT This condition may be treated with:  Fluid replacement. This may be done to correct dehydration.  Insulin injections. These may be given through the skin or through an IV tube.  Electrolyte replacement. Electrolytes, such as potassium and sodium, may be given in pill form or through an IV tube.  Antibiotic medicines. These may be prescribed if your condition was caused by an infection. HOME CARE INSTRUCTIONS Eating and Drinking  Drink enough fluids to keep your urine clear or pale yellow.  If you cannot eat, alternate between drinking fluids with sugar (such as juice) and salty fluids (such as broth or bouillon).  If you can eat, follow your usual diet and drink sugar-free liquids, such as water. Other Instructions  Take insulin as directed by your health care provider. Do not skip insulin injections. Do not use expired insulin.  If your blood sugar is over 240 mg/dL, monitor your urine ketones every 4-6 hours.  If you were prescribed an antibiotic medicine, finish all of it even if you start to feel better.  Rest and exercise only as directed by your health care provider.  If you get sick, call your health care provider and begin treatment quickly. Your body often needs extra insulin to fight an illness.  Check your blood glucose levels regularly. If your blood glucose is high, drink plenty of fluids. This helps  to flush out ketones. SEEK MEDICAL CARE IF:  Your blood glucose level is too high or too low.  You have ketones in your urine.  You have a fever.  You cannot eat.  You cannot tolerate fluids.  You have been vomiting for more than 2 hours.  You continue to have symptoms of this condition.  You develop new symptoms. SEEK IMMEDIATE MEDICAL CARE IF:  Your blood glucose levels continue to be high (elevated).  Your  monitor reads "high" even when you are taking insulin.  You faint.  You have chest pain.  You have trouble breathing.  You have a sudden, severe headache.  You have sudden weakness in one arm or one leg.  You have sudden trouble speaking or swallowing.  You have vomiting or diarrhea that gets worse after 3 hours.  You feel severely fatigued.  You have trouble thinking.  You have abdominal pain.  You are severely dehydrated. Symptoms of severe dehydration include:  Extreme thirst.  Dry mouth.  Blue lips.  Cold hands and feet.  Rapid breathing.   This information is not intended to replace advice given to you by your health care provider. Make sure you discuss any questions you have with your health care provider.   Document Released: 01/03/2000 Document Revised: 05/22/2014 Document Reviewed: 12/13/2013 Elsevier Interactive Patient Education [employer].

## 2015-11-23 NOTE — Progress Notes (Signed)
Patient discharged to home with family, discharge instructions reviewed with patient who verbalized understanding. No new RX.

## 2015-11-23 NOTE — Discharge Summary (Signed)
Physician Discharge Summary  [name] OFH:[id number] DOB: December 29, 1990 DOA: 11/18/2015  PCP: Vicenta Aly, FNP  Admit date: 11/18/2015 Discharge date: 11/23/2015  Recommendations for Outpatient Follow-up:  Take potassium supplement for 5 days on discharge.  Discharge Diagnoses:  Principal Problem:   DKA (diabetic ketoacidoses) ([place]) Active Problems:   Hematemesis   Diabetic gastroparesis (HCC)   Intractable nausea and vomiting    Discharge Condition: stable   Diet recommendation: as tolerated   History of present illness:  25 y.o.femalewith a past medical history of poorly controlled type 1 diabetes, severe diabetic gastroparesis multiple admissions for recurrent episodes of DKA. She was admitted to hospital for DKA this time as well. She required admission to stepdown unit because she was on insulin drip.   Hospital Course:   Assessment & Plan:   Principal Problem: DKA (diabetic ketoacidoses) without coma associated with type 1 DM (HCC) - Diabetic ketoacidosis criteria met on the admission - Patient required admission to stepdown unit because she required insulin drip per DKA protocol - Now off of insulin drip so transitioned to home dose insulin which she will continue on discharge - Transferred from SDU to floor 11/4  Active problems: Uncontrolled type 1 diabetes mellitus with diabetic neuropathy with long-term insulin use - Last A1c in October 2017 was 10.3 indicating poor glycemic control - Appreciate diabetic coordinator assessment and recommendation  - Resume home insulin regime on discharge   Diabetic gastroparesis - Secondary to uncontrolled diabetes - No acute findings on abdominal x-ray  - Feels better, tolerated regular diet prior to discharge   Hypokalemia - Due to GI losses  - Supplemented  - She will cotninue 5 more days of low dose potassium supplementation     DVT prophylaxis: SCDs bilaterally Code Status: full code  Family  Communication: No family at the bedside    Consultants:   Diabetic coordinator  Procedures:   None  Antimicrobials:   None   Signed:  Leisa Lenz, MD  [facility] 11/23/2015, 8:22 AM  Pager #: [phone]  Time spent in minutes: less than 30 minutes   Discharge Exam: Vitals:   11/22/15 2134 11/23/15 0518  BP: (!) 140/100 106/71  Pulse: (!) 129 95  Resp: 16 16  Temp: 100.1 F (37.8 C) 98.5 F (36.9 C)   Vitals:   11/22/15 1600 11/22/15 1700 11/22/15 2134 11/23/15 0518  BP: (!) 148/90 (!) 147/92 (!) 140/100 106/71  Pulse:   (!) 129 95  Resp: 18 18 16 16   Temp:  99.5 F (37.5 C) 100.1 F (37.8 C) 98.5 F (36.9 C)  TempSrc:   Oral Oral  SpO2: 100% 100% 100% 99%  Weight:      Height:        General: Pt is alert, follows commands appropriately, not in acute distress Cardiovascular: Regular rate and rhythm, S1/S2 + Respiratory: Clear to auscultation bilaterally, no wheezing, no crackles, no rhonchi Abdominal: Soft, non tender, non distended, bowel sounds +, no guarding Extremities: no edema, no cyanosis, pulses palpable bilaterally DP and PT Neuro: Grossly nonfocal  Discharge Instructions  Discharge Instructions    Call MD for:  persistant nausea and vomiting    Complete by:  As directed    Call MD for:  redness, tenderness, or signs of infection (pain, swelling, redness, odor or green/yellow discharge around incision site)    Complete by:  As directed    Call MD for:  severe uncontrolled pain    Complete by:  As directed  Diet - low sodium heart healthy    Complete by:  As directed    Discharge instructions    Complete by:  As directed    Take potassium supplement for 5 days on discharge.   Increase activity slowly    Complete by:  As directed        Medication List    TAKE these medications   albuterol 108 (90 Base) MCG/ACT inhaler Commonly known as:  PROVENTIL HFA;VENTOLIN HFA Inhale 2 puffs into the lungs every 6 (six)  hours as needed for shortness of breath.   albuterol (2.5 MG/3ML) 0.083% nebulizer solution Commonly known as:  PROVENTIL Take 2.5 mg by nebulization every 6 (six) hours as needed for shortness of breath.   BASAGLAR KWIKPEN 100 UNIT/ML Sopn Inject 0.2 mLs (20 Units total) into the skin at bedtime. What changed:  medication strength  how much to take   budesonide-formoterol 160-4.5 MCG/ACT inhaler Commonly known as:  SYMBICORT Inhale 2 puffs into the lungs daily.   insulin aspart 100 UNIT/ML FlexPen Commonly known as:  NOVOLOG FLEXPEN INJECT 0-20 UNITS INTO THE SKIN AS DIRECTED. SSI - CARB SCALE, 0-20 UNITS What changed:  how much to take  how to take this  when to take this  reasons to take this  additional instructions   LEXAPRO 10 MG tablet Generic drug:  escitalopram Take 5 mg by mouth daily.   metoCLOPramide 10 MG tablet Commonly known as:  REGLAN Take 1 tablet (10 mg total) by mouth 3 (three) times daily before meals.   montelukast 10 MG tablet Commonly known as:  SINGULAIR Take 10 mg by mouth daily.   potassium chloride SA 20 MEQ tablet Commonly known as:  K-DUR,KLOR-CON Take 1 tablet (20 mEq total) by mouth daily.   promethazine 25 MG tablet Commonly known as:  PHENERGAN Take 1 tablet (25 mg total) by mouth every 6 (six) hours as needed for refractory nausea / vomiting.      Follow-up [place] Follow up on 11/28/2015.   Why:  Hospital follow-up appointment on 11/28/15 at 11:00 am. Contact information: [place] [address] [phone]       Vicenta Aly, [place]. Schedule an appointment as soon as possible for a visit in 2 week(s).   Specialty:  Nurse Practitioner Contact information: [address] [place]  [address] [phone]            The results of significant diagnostics from this hospitalization (including imaging,  microbiology, ancillary and laboratory) are listed below for reference.    Significant Diagnostic Studies: Ct Abdomen Pelvis Wo Contrast  Result Date: 11/02/2015 CLINICAL DATA:  25 y.o. female with medical history significant of DM1 since age 49 yo, gastroparesis, htn, pancreatitis, asthma, anxiety comes in with acute onset of intractable vomiting since 3pm earlier. Generalized abdominal pain with vomiting. Patient being referred for admission for gastroparesis with intractable vomiting and pain. : CT ABDOMEN AND PELVIS WITHOUT CONTRAST TECHNIQUE: Multidetector CT imaging of the abdomen and pelvis was performed following the standard protocol without IV contrast. COMPARISON:  CT abdomen dated 04/21/2015 and CT abdomen dated 11/25/2014. FINDINGS: Lower chest: No acute abnormality. Hepatobiliary: Characterization of the liver is limited without intravascular contrast. The 2 dominant lesions within the right liver lobe, better demonstrated on earlier contrast enhanced CT of 04/21/2015, are grossly stable. The smaller hypodense lesion within the left liver lobe is also grossly stable. No new liver  lesions identified. Patient is status post cholecystectomy. No bile duct dilatation appreciated. Pancreas: Unremarkable. No pancreatic ductal dilatation or surrounding inflammatory changes. Spleen: Normal in size without focal abnormality. Adrenals/Urinary Tract: Adrenal glands appear normal. Bladder is prominently distended. Mild bilateral hydronephrosis, right greater the left, likely related to the bladder distension. No renal, ureteral or bladder calculi identified. Stomach/Bowel: Bowel is normal in caliber. No bowel wall thickening or evidence of bowel wall inflammation seen. Appendix appears normal. Appendix is not seen. Vascular/Lymphatic: No significant vascular findings are present. No enlarged abdominal or pelvic lymph nodes. Reproductive: Unremarkable. Other: No free fluid or abscess collections seen. No free  intraperitoneal air. Musculoskeletal: No osseous abnormality. Superficial soft tissues are unremarkable. IMPRESSION: 1. Fairly severe bladder distension, extending into the mid abdomen. Mild bilateral hydronephrosis, right greater than left, presumably related to the bladder distension. No perinephric inflammation. No renal, ureteral or bladder calculi. 2. Three liver lesions appear grossly stable compared to the earlier exams, better demonstrated on the earlier contrast enhanced CT and MRI studies. 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 Per the MRI report of 11/27/2014, a recommended six-month follow-up MRI was due in May of 2017. 3. Remainder of the abdomen and pelvis CT is unremarkable. No bowel obstruction or evidence of bowel wall inflammation seen. No free fluid. Electronically Signed   By: Franki Cabot M.D.   On: 11/02/2015 19:39   Dg Abd 1 View  Result Date: 11/20/2015 CLINICAL DATA:  Vomiting, DKA EXAM: ABDOMEN - 1 VIEW COMPARISON:  11/10/2015 FINDINGS: Surgical clips in the upper abdomen. Nonspecific, nonobstructed gas pattern. No pathologic calcifications. IMPRESSION: Nonspecific nonobstructed gas pattern Electronically Signed   By: Donavan Foil M.D.   On: 11/20/2015 17:26   Dg Abd 1 View  Result Date: 11/10/2015 CLINICAL DATA:  Nausea and vomiting today EXAM: ABDOMEN - 1 VIEW COMPARISON:  None. FINDINGS: The bowel gas pattern is normal. No radio-opaque calculi or other significant radiographic abnormality are seen. IMPRESSION: Negative. Electronically Signed   By: Lahoma Crocker M.D.   On: 11/10/2015 15:07   Portable Chest X-ray (1 View)  Result Date: 11/09/2015 CLINICAL DATA:  DKA type 1. H/o asthma, heart murmur, HTN. Smokes marijuana. EXAM: PORTABLE CHEST 1 VIEW COMPARISON:  06/09/2015 FINDINGS: The heart size and mediastinal contours are within normal limits. Both lungs are clear. The visualized skeletal structures are unremarkable. IMPRESSION: No  active disease. Electronically Signed   By: Nolon Nations M.D.   On: 11/09/2015 23:29   Dg Abd 2 Views  Result Date: 11/01/2015 CLINICAL DATA:  Vomiting. Nausea and diffuse abdominal pain for 1 day. EXAM: ABDOMEN - 2 VIEW COMPARISON:  Radiographs 07/05/2015.  CT 04/21/2015 FINDINGS: No dilated bowel loops to suggest obstruction. Small volume of stool in the descending colon. There is otherwise generalized paucity of bowel gas. This is unchanged from scout tomogram from CT which demonstrated normal bowel. No free air. No radiopaque calculi. Calcifications in the pelvis are phleboliths. The lung bases are clear. There is no osseous abnormality. IMPRESSION: Small stool burden.  No bowel dilatation to suggest obstruction. Electronically Signed   By: Jeb Levering M.D.   On: 11/01/2015 22:18    Microbiology: No results found for this or any previous visit (from the past 240 hour(s)).   Labs: Basic Metabolic Panel:  Recent Labs Lab 11/20/15 0329  11/21/15 0313 11/21/15 1344 11/21/15 1802 11/22/15 0213 11/22/15 0713 11/23/15 0428  NA 136  < > 137 137 138 138 139 138  K 3.3*  < > 2.8* 4.5 4.1 3.2* 3.1* 2.9*  CL 114*  < > 111 111 106 106 108 109  CO2 18*  < > 20* 16* 22 23 22 22   GLUCOSE 154*  < > 245* 203* 184* 190* 258* 289*  BUN <5*  < > <5* <5* <5* <5* <5* <5*  CREATININE 0.46  < > 0.43* 0.53 0.48 0.37* 0.45 0.54  CALCIUM 7.7*  < > 7.9* 8.6* 8.4* 8.8* 8.7* 8.4*  MG 1.4*  --  1.6*  --   --   --   --  1.6*  < > = values in this interval not displayed. Liver Function Tests:  Recent Labs Lab 11/18/15 1853 11/21/15 0313  AST 24 18  ALT 15 11*  ALKPHOS 109 77  BILITOT 1.8* 1.1  PROT 8.4* 5.6*  ALBUMIN 4.5 3.0*    Recent Labs Lab 11/18/15 1853 11/21/15 0907  LIPASE 15 16   No results for input(s): AMMONIA in the last 168 hours. CBC:  Recent Labs Lab 11/18/15 1853 11/19/15 0301 11/20/15 0329 11/21/15 0313  WBC 9.0 15.7* 8.0 5.5  HGB 13.6 11.1* 10.9* 11.0*  HCT  41.9 33.8* 32.8* 32.1*  MCV 92.9 92.6 90.9 87.2  PLT 449* 397 345 330   Cardiac Enzymes:  Recent Labs Lab 11/18/15 1940  TROPONINI <0.03   BNP: BNP (last 3 results)  Recent Labs  04/11/15 0325 07/11/15 0353  BNP 43.5 6.9    ProBNP (last 3 results) No results for input(s): PROBNP in the last 8760 hours.  CBG:  Recent Labs Lab 11/22/15 1111 11/22/15 1147 11/22/15 1713 11/22/15 2130 11/23/15 0741  GLUCAP 214* 147* 304* 104* 241*

## 2015-11-28 ENCOUNTER — Inpatient Hospital Stay: Payer: Medicaid Other

## 2016-01-16 ENCOUNTER — Encounter (HOSPITAL_COMMUNITY): Payer: Self-pay | Admitting: Emergency Medicine

## 2016-01-16 ENCOUNTER — Inpatient Hospital Stay (HOSPITAL_COMMUNITY)
Admission: EM | Admit: 2016-01-16 | Discharge: 2016-01-19 | DRG: 638 | Disposition: A | Discharge status: Home / Self Care | Payer: Medicaid Other | Attending: Internal Medicine | Admitting: Internal Medicine

## 2016-01-16 DIAGNOSIS — E1065 Type 1 diabetes mellitus with hyperglycemia: Secondary | ICD-10-CM

## 2016-01-16 DIAGNOSIS — Z885 Allergy status to narcotic agent status: Secondary | ICD-10-CM

## 2016-01-16 DIAGNOSIS — K760 Fatty (change of) liver, not elsewhere classified: Secondary | ICD-10-CM | POA: Diagnosis present

## 2016-01-16 DIAGNOSIS — K3184 Gastroparesis: Secondary | ICD-10-CM | POA: Diagnosis present

## 2016-01-16 DIAGNOSIS — E1143 Type 2 diabetes mellitus with diabetic autonomic (poly)neuropathy: Secondary | ICD-10-CM | POA: Diagnosis present

## 2016-01-16 DIAGNOSIS — N390 Urinary tract infection, site not specified: Secondary | ICD-10-CM | POA: Diagnosis present

## 2016-01-16 DIAGNOSIS — R000 Tachycardia, unspecified: Secondary | ICD-10-CM | POA: Diagnosis present

## 2016-01-16 DIAGNOSIS — F329 Major depressive disorder, single episode, unspecified: Secondary | ICD-10-CM | POA: Diagnosis present

## 2016-01-16 DIAGNOSIS — F32A Depression, unspecified: Secondary | ICD-10-CM

## 2016-01-16 DIAGNOSIS — F419 Anxiety disorder, unspecified: Secondary | ICD-10-CM | POA: Diagnosis present

## 2016-01-16 DIAGNOSIS — Z7951 Long term (current) use of inhaled steroids: Secondary | ICD-10-CM

## 2016-01-16 DIAGNOSIS — G894 Chronic pain syndrome: Secondary | ICD-10-CM | POA: Diagnosis present

## 2016-01-16 DIAGNOSIS — K920 Hematemesis: Secondary | ICD-10-CM | POA: Diagnosis present

## 2016-01-16 DIAGNOSIS — E860 Dehydration: Secondary | ICD-10-CM

## 2016-01-16 DIAGNOSIS — E1043 Type 1 diabetes mellitus with diabetic autonomic (poly)neuropathy: Secondary | ICD-10-CM | POA: Diagnosis present

## 2016-01-16 DIAGNOSIS — Z91010 Allergy to peanuts: Secondary | ICD-10-CM

## 2016-01-16 DIAGNOSIS — Z91018 Allergy to other foods: Secondary | ICD-10-CM

## 2016-01-16 DIAGNOSIS — Z8249 Family history of ischemic heart disease and other diseases of the circulatory system: Secondary | ICD-10-CM

## 2016-01-16 DIAGNOSIS — J45909 Unspecified asthma, uncomplicated: Secondary | ICD-10-CM | POA: Diagnosis present

## 2016-01-16 DIAGNOSIS — Z833 Family history of diabetes mellitus: Secondary | ICD-10-CM

## 2016-01-16 DIAGNOSIS — Z79899 Other long term (current) drug therapy: Secondary | ICD-10-CM

## 2016-01-16 DIAGNOSIS — E1010 Type 1 diabetes mellitus with ketoacidosis without coma: Secondary | ICD-10-CM | POA: Diagnosis present

## 2016-01-16 DIAGNOSIS — R1110 Vomiting, unspecified: Secondary | ICD-10-CM

## 2016-01-16 LAB — COMPREHENSIVE METABOLIC PANEL
ALT: 16 U/L (ref 14–54)
ANION GAP: 16 — AB (ref 5–15)
AST: 31 U/L (ref 15–41)
Albumin: 4.7 g/dL (ref 3.5–5.0)
Alkaline Phosphatase: 108 U/L (ref 38–126)
BILIRUBIN TOTAL: 1.2 mg/dL (ref 0.3–1.2)
BUN: 16 mg/dL (ref 6–20)
CO2: 22 mmol/L (ref 22–32)
Calcium: 10.1 mg/dL (ref 8.9–10.3)
Chloride: 106 mmol/L (ref 101–111)
Creatinine, Ser: 0.83 mg/dL (ref 0.44–1.00)
GFR calc Af Amer: >60 mL/min (ref >60)
Glucose, Bld: 163 mg/dL — ABNORMAL HIGH (ref 65–99)
POTASSIUM: 3.7 mmol/L (ref 3.5–5.1)
Sodium: 144 mmol/L (ref 135–145)
Total Protein: 8.7 g/dL — ABNORMAL HIGH (ref 6.5–8.1)

## 2016-01-16 LAB — I-STAT BETA HCG BLOOD, ED (MC, WL, AP ONLY)

## 2016-01-16 LAB — CBC
HEMATOCRIT: 42.3 % (ref 36.0–46.0)
Hemoglobin: 14.4 g/dL (ref 12.0–15.0)
MCH: 29.3 pg (ref 26.0–34.0)
MCHC: 34 g/dL (ref 30.0–36.0)
MCV: 86 fL (ref 78.0–100.0)
Platelets: 472 10*3/uL — ABNORMAL HIGH (ref 150–400)
RBC: 4.92 MIL/uL (ref 3.87–5.11)
RDW: 12.9 % (ref 11.5–15.5)
WBC: 9.3 10*3/uL (ref 4.0–10.5)

## 2016-01-16 LAB — LIPASE, BLOOD: Lipase: 11 U/L (ref 11–51)

## 2016-01-16 NOTE — ED Notes (Signed)
Pt unable to get urine at this time. Pt aware of urine sample.

## 2016-01-16 NOTE — ED Triage Notes (Signed)
Pt states she has gastroparesis and has been vomiting for the past 4 hours  Pt is c/o abd pain  Denies diarrhea

## 2016-01-17 ENCOUNTER — Inpatient Hospital Stay (HOSPITAL_COMMUNITY): Payer: Medicaid Other

## 2016-01-17 DIAGNOSIS — K3184 Gastroparesis: Secondary | ICD-10-CM | POA: Diagnosis not present

## 2016-01-17 DIAGNOSIS — Z7951 Long term (current) use of inhaled steroids: Secondary | ICD-10-CM | POA: Diagnosis not present

## 2016-01-17 DIAGNOSIS — K760 Fatty (change of) liver, not elsewhere classified: Secondary | ICD-10-CM | POA: Diagnosis present

## 2016-01-17 DIAGNOSIS — E1043 Type 1 diabetes mellitus with diabetic autonomic (poly)neuropathy: Secondary | ICD-10-CM | POA: Diagnosis present

## 2016-01-17 DIAGNOSIS — Z91010 Allergy to peanuts: Secondary | ICD-10-CM | POA: Diagnosis not present

## 2016-01-17 DIAGNOSIS — F329 Major depressive disorder, single episode, unspecified: Secondary | ICD-10-CM | POA: Diagnosis present

## 2016-01-17 DIAGNOSIS — E1010 Type 1 diabetes mellitus with ketoacidosis without coma: Secondary | ICD-10-CM | POA: Diagnosis not present

## 2016-01-17 DIAGNOSIS — G894 Chronic pain syndrome: Secondary | ICD-10-CM | POA: Diagnosis present

## 2016-01-17 DIAGNOSIS — E1065 Type 1 diabetes mellitus with hyperglycemia: Secondary | ICD-10-CM | POA: Diagnosis not present

## 2016-01-17 DIAGNOSIS — E1143 Type 2 diabetes mellitus with diabetic autonomic (poly)neuropathy: Secondary | ICD-10-CM | POA: Diagnosis not present

## 2016-01-17 DIAGNOSIS — Z91018 Allergy to other foods: Secondary | ICD-10-CM | POA: Diagnosis not present

## 2016-01-17 DIAGNOSIS — Z885 Allergy status to narcotic agent status: Secondary | ICD-10-CM | POA: Diagnosis not present

## 2016-01-17 DIAGNOSIS — Z8249 Family history of ischemic heart disease and other diseases of the circulatory system: Secondary | ICD-10-CM | POA: Diagnosis not present

## 2016-01-17 DIAGNOSIS — R112 Nausea with vomiting, unspecified: Secondary | ICD-10-CM | POA: Diagnosis not present

## 2016-01-17 DIAGNOSIS — K219 Gastro-esophageal reflux disease without esophagitis: Secondary | ICD-10-CM | POA: Diagnosis not present

## 2016-01-17 DIAGNOSIS — N390 Urinary tract infection, site not specified: Secondary | ICD-10-CM | POA: Diagnosis present

## 2016-01-17 DIAGNOSIS — F419 Anxiety disorder, unspecified: Secondary | ICD-10-CM | POA: Diagnosis present

## 2016-01-17 DIAGNOSIS — E860 Dehydration: Secondary | ICD-10-CM | POA: Diagnosis present

## 2016-01-17 DIAGNOSIS — J45909 Unspecified asthma, uncomplicated: Secondary | ICD-10-CM | POA: Diagnosis present

## 2016-01-17 DIAGNOSIS — K920 Hematemesis: Secondary | ICD-10-CM | POA: Diagnosis present

## 2016-01-17 DIAGNOSIS — R000 Tachycardia, unspecified: Secondary | ICD-10-CM | POA: Diagnosis present

## 2016-01-17 DIAGNOSIS — Z79899 Other long term (current) drug therapy: Secondary | ICD-10-CM | POA: Diagnosis not present

## 2016-01-17 DIAGNOSIS — Z833 Family history of diabetes mellitus: Secondary | ICD-10-CM | POA: Diagnosis not present

## 2016-01-17 LAB — BASIC METABOLIC PANEL
ANION GAP: 10 (ref 5–15)
ANION GAP: 11 (ref 5–15)
ANION GAP: 11 (ref 5–15)
BUN: 16 mg/dL (ref 6–20)
BUN: 17 mg/dL (ref 6–20)
BUN: 14 mg/dL (ref 6–20)
BUN: 13 mg/dL (ref 6–20)
BUN: 12 mg/dL (ref 6–20)
CALCIUM: 9.4 mg/dL (ref 8.9–10.3)
CHLORIDE: 111 mmol/L (ref 101–111)
CHLORIDE: 111 mmol/L (ref 101–111)
CHLORIDE: 108 mmol/L (ref 101–111)
CHLORIDE: 109 mmol/L (ref 101–111)
CO2: <7 mmol/L — ABNORMAL LOW (ref 22–32)
CO2: 14 mmol/L — AB (ref 22–32)
CO2: 15 mmol/L — ABNORMAL LOW (ref 22–32)
CO2: 17 mmol/L — ABNORMAL LOW (ref 22–32)
CREATININE: 0.92 mg/dL (ref 0.44–1.00)
Calcium: 9.2 mg/dL (ref 8.9–10.3)
Calcium: 8.2 mg/dL — ABNORMAL LOW (ref 8.9–10.3)
Calcium: 8.5 mg/dL — ABNORMAL LOW (ref 8.9–10.3)
Calcium: 8.8 mg/dL — ABNORMAL LOW (ref 8.9–10.3)
Chloride: 110 mmol/L (ref 101–111)
Creatinine, Ser: 0.89 mg/dL (ref 0.44–1.00)
Creatinine, Ser: 0.49 mg/dL (ref 0.44–1.00)
Creatinine, Ser: 0.6 mg/dL (ref 0.44–1.00)
Creatinine, Ser: 0.48 mg/dL (ref 0.44–1.00)
GFR calc non Af Amer: >60 mL/min (ref >60)
GFR calc non Af Amer: >60 mL/min (ref >60)
GLUCOSE: 184 mg/dL — AB (ref 65–99)
Glucose, Bld: 450 mg/dL — ABNORMAL HIGH (ref 65–99)
Glucose, Bld: 129 mg/dL — ABNORMAL HIGH (ref 65–99)
Glucose, Bld: 234 mg/dL — ABNORMAL HIGH (ref 65–99)
Glucose, Bld: 177 mg/dL — ABNORMAL HIGH (ref 65–99)
POTASSIUM: 5.8 mmol/L — AB (ref 3.5–5.1)
POTASSIUM: 4 mmol/L (ref 3.5–5.1)
POTASSIUM: 3.7 mmol/L (ref 3.5–5.1)
Potassium: 4.8 mmol/L (ref 3.5–5.1)
Potassium: 4 mmol/L (ref 3.5–5.1)
SODIUM: 140 mmol/L (ref 135–145)
SODIUM: 134 mmol/L — AB (ref 135–145)
SODIUM: 137 mmol/L (ref 135–145)
Sodium: 138 mmol/L (ref 135–145)
Sodium: 135 mmol/L (ref 135–145)

## 2016-01-17 LAB — URINALYSIS, ROUTINE W REFLEX MICROSCOPIC
Bilirubin Urine: NEGATIVE
Glucose, UA: >=500 mg/dL — AB
HGB URINE DIPSTICK: NEGATIVE
Ketones, ur: 80 mg/dL — AB
NITRITE: NEGATIVE
PROTEIN: 30 mg/dL — AB
SPECIFIC GRAVITY, URINE: 1.021 (ref 1.005–1.030)
Squamous Epithelial / LPF: NONE SEEN
pH: 6 (ref 5.0–8.0)

## 2016-01-17 LAB — BLOOD GAS, VENOUS
ACID-BASE DEFICIT: 22.8 mmol/L — AB (ref 0.0–2.0)
BICARBONATE: 6.2 mmol/L — AB (ref 20.0–28.0)
O2 Saturation: 79.6 %
PATIENT TEMPERATURE: 98.6
PH VEN: 7.107 — AB (ref 7.250–7.430)
PO2 VEN: 59.1 mmHg — AB (ref 32.0–45.0)
pCO2, Ven: 20.5 mmHg — ABNORMAL LOW (ref 44.0–60.0)

## 2016-01-17 LAB — I-STAT CHEM 8, ED
BUN: 17 mg/dL (ref 6–20)
CREATININE: 0.4 mg/dL — AB (ref 0.44–1.00)
Calcium, Ion: 1.24 mmol/L (ref 1.15–1.40)
Chloride: 115 mmol/L — ABNORMAL HIGH (ref 101–111)
Glucose, Bld: 472 mg/dL — ABNORMAL HIGH (ref 65–99)
HEMATOCRIT: 46 % (ref 36.0–46.0)
HEMOGLOBIN: 15.6 g/dL — AB (ref 12.0–15.0)
POTASSIUM: 5.6 mmol/L — AB (ref 3.5–5.1)
SODIUM: 139 mmol/L (ref 135–145)
TCO2: 9 mmol/L (ref 0–100)

## 2016-01-17 LAB — GLUCOSE, CAPILLARY
GLUCOSE-CAPILLARY: 191 mg/dL — AB (ref 65–99)
GLUCOSE-CAPILLARY: 151 mg/dL — AB (ref 65–99)
GLUCOSE-CAPILLARY: 158 mg/dL — AB (ref 65–99)
GLUCOSE-CAPILLARY: 95 mg/dL (ref 65–99)
GLUCOSE-CAPILLARY: 130 mg/dL — AB (ref 65–99)
GLUCOSE-CAPILLARY: 199 mg/dL — AB (ref 65–99)
GLUCOSE-CAPILLARY: 158 mg/dL — AB (ref 65–99)
GLUCOSE-CAPILLARY: 208 mg/dL — AB (ref 65–99)
Glucose-Capillary: 334 mg/dL — ABNORMAL HIGH (ref 65–99)
Glucose-Capillary: 146 mg/dL — ABNORMAL HIGH (ref 65–99)
Glucose-Capillary: 111 mg/dL — ABNORMAL HIGH (ref 65–99)
Glucose-Capillary: 196 mg/dL — ABNORMAL HIGH (ref 65–99)
Glucose-Capillary: 233 mg/dL — ABNORMAL HIGH (ref 65–99)
Glucose-Capillary: 145 mg/dL — ABNORMAL HIGH (ref 65–99)
Glucose-Capillary: 179 mg/dL — ABNORMAL HIGH (ref 65–99)

## 2016-01-17 LAB — CBG MONITORING, ED
GLUCOSE-CAPILLARY: 430 mg/dL — AB (ref 65–99)
GLUCOSE-CAPILLARY: 454 mg/dL — AB (ref 65–99)

## 2016-01-17 LAB — MRSA PCR SCREENING: MRSA by PCR: NEGATIVE

## 2016-01-17 LAB — I-STAT CG4 LACTIC ACID, ED: LACTIC ACID, VENOUS: 1.63 mmol/L (ref 0.5–1.9)

## 2016-01-17 MED ORDER — DEXTROSE-NACL 5-0.45 % IV SOLN
INTRAVENOUS | Status: DC
  Administered 2016-01-17 (×2): via INTRAVENOUS

## 2016-01-17 MED ORDER — ESCITALOPRAM OXALATE 10 MG PO TABS
5.0000 mg | ORAL_TABLET | Freq: Every day | ORAL | Status: DC
  Administered 2016-01-17 – 2016-01-19 (×2): 5 mg via ORAL
  Filled 2016-01-17 (×2): qty 0.5
  Filled 2016-01-17: qty 1

## 2016-01-17 MED ORDER — SODIUM CHLORIDE 0.9 % IV SOLN: INTRAVENOUS | Status: DC

## 2016-01-17 MED ORDER — HALOPERIDOL LACTATE 5 MG/ML IJ SOLN
5.0000 mg | Freq: Once | INTRAMUSCULAR | Status: AC
  Administered 2016-01-17: 5 mg via INTRAVENOUS
  Filled 2016-01-17: qty 1

## 2016-01-17 MED ORDER — ENOXAPARIN SODIUM 40 MG/0.4ML ~~LOC~~ SOLN
40.0000 mg | SUBCUTANEOUS | Status: DC
  Administered 2016-01-17 – 2016-01-19 (×3): 40 mg via SUBCUTANEOUS
  Filled 2016-01-17 (×3): qty 0.4

## 2016-01-17 MED ORDER — SODIUM CHLORIDE 0.9 % IV BOLUS (SEPSIS)
1000.0000 mL | Freq: Once | INTRAVENOUS | Status: AC
  Administered 2016-01-17: 1000 mL via INTRAVENOUS

## 2016-01-17 MED ORDER — SODIUM CHLORIDE 0.9 % IV SOLN
INTRAVENOUS | Status: DC
  Administered 2016-01-17: 3.9 [IU]/h via INTRAVENOUS
  Filled 2016-01-17: qty 2.5

## 2016-01-17 MED ORDER — MONTELUKAST SODIUM 10 MG PO TABS
10.0000 mg | ORAL_TABLET | Freq: Every day | ORAL | Status: DC
  Administered 2016-01-17 – 2016-01-19 (×2): 10 mg via ORAL
  Filled 2016-01-17 (×2): qty 1

## 2016-01-17 MED ORDER — PROMETHAZINE HCL 25 MG/ML IJ SOLN
25.0000 mg | Freq: Four times a day (QID) | INTRAMUSCULAR | Status: DC | PRN
  Administered 2016-01-17 – 2016-01-19 (×7): 25 mg via INTRAVENOUS
  Filled 2016-01-17 (×7): qty 1

## 2016-01-17 MED ORDER — ONDANSETRON HCL 4 MG/2ML IJ SOLN
4.0000 mg | Freq: Once | INTRAMUSCULAR | Status: AC
  Administered 2016-01-17: 4 mg via INTRAVENOUS
  Filled 2016-01-17: qty 2

## 2016-01-17 MED ORDER — FAMOTIDINE IN NACL 20-0.9 MG/50ML-% IV SOLN
20.0000 mg | Freq: Once | INTRAVENOUS | Status: AC
  Administered 2016-01-17: 20 mg via INTRAVENOUS
  Filled 2016-01-17: qty 50

## 2016-01-17 MED ORDER — MORPHINE SULFATE (PF) 4 MG/ML IV SOLN
4.0000 mg | Freq: Once | INTRAVENOUS | Status: AC
  Administered 2016-01-17: 4 mg via INTRAVENOUS
  Filled 2016-01-17: qty 1

## 2016-01-17 MED ORDER — METOCLOPRAMIDE HCL 5 MG/ML IJ SOLN
10.0000 mg | Freq: Once | INTRAMUSCULAR | Status: AC
  Administered 2016-01-17: 10 mg via INTRAVENOUS
  Filled 2016-01-17: qty 2

## 2016-01-17 MED ORDER — INSULIN GLARGINE 100 UNIT/ML ~~LOC~~ SOLN: 15.0000 [IU] | Freq: Every day | SUBCUTANEOUS | Status: DC

## 2016-01-17 MED ORDER — SODIUM CHLORIDE 0.9 % IV SOLN
INTRAVENOUS | Status: AC
  Administered 2016-01-17: 11:00:00 via INTRAVENOUS

## 2016-01-17 MED ORDER — HYDROCODONE-ACETAMINOPHEN 5-325 MG PO TABS
1.0000 | ORAL_TABLET | ORAL | Status: DC | PRN
  Administered 2016-01-17 – 2016-01-19 (×6): 2 via ORAL
  Filled 2016-01-17 (×2): qty 2
  Filled 2016-01-17: qty 1
  Filled 2016-01-17 (×4): qty 2

## 2016-01-17 MED ORDER — INSULIN ASPART 100 UNIT/ML ~~LOC~~ SOLN: 0.0000 [IU] | SUBCUTANEOUS | Status: DC

## 2016-01-17 MED ORDER — PROMETHAZINE HCL 25 MG/ML IJ SOLN
25.0000 mg | Freq: Once | INTRAMUSCULAR | Status: AC
  Administered 2016-01-17: 25 mg via INTRAVENOUS
  Filled 2016-01-17: qty 1

## 2016-01-17 MED ORDER — ALBUTEROL SULFATE HFA 108 (90 BASE) MCG/ACT IN AERS
2.0000 | INHALATION_SPRAY | Freq: Four times a day (QID) | RESPIRATORY_TRACT | Status: DC | PRN
Start: 2016-01-17 — End: 2016-01-17

## 2016-01-17 MED ORDER — MOMETASONE FURO-FORMOTEROL FUM 200-5 MCG/ACT IN AERO
2.0000 | INHALATION_SPRAY | Freq: Two times a day (BID) | RESPIRATORY_TRACT | Status: DC
  Filled 2016-01-17: qty 8.8

## 2016-01-17 MED ORDER — ALBUTEROL SULFATE (2.5 MG/3ML) 0.083% IN NEBU: 2.5000 mg | INHALATION_SOLUTION | Freq: Four times a day (QID) | RESPIRATORY_TRACT | Status: DC | PRN

## 2016-01-17 MED ORDER — LACTATED RINGERS IV BOLUS (SEPSIS)
1000.0000 mL | Freq: Once | INTRAVENOUS | Status: AC
  Administered 2016-01-17: 1000 mL via INTRAVENOUS

## 2016-01-17 MED ORDER — SODIUM CHLORIDE 0.9 % IV SOLN
INTRAVENOUS | Status: DC
  Administered 2016-01-17 – 2016-01-19 (×3): via INTRAVENOUS

## 2016-01-17 MED ORDER — MORPHINE SULFATE (PF) 4 MG/ML IV SOLN
4.0000 mg | Freq: Once | INTRAVENOUS | Status: AC
Start: 2016-01-17 — End: 2016-01-17
  Administered 2016-01-17: 4 mg via INTRAVENOUS
  Filled 2016-01-17: qty 1

## 2016-01-17 MED ORDER — METOCLOPRAMIDE HCL 5 MG/ML IJ SOLN
10.0000 mg | Freq: Four times a day (QID) | INTRAMUSCULAR | Status: DC
  Administered 2016-01-17 – 2016-01-19 (×8): 10 mg via INTRAVENOUS
  Filled 2016-01-17 (×9): qty 2

## 2016-01-17 NOTE — H&P (Signed)
History and Physical    [name] [id number] DOB: 30-May-1990 DOA: 01/16/2016   PCP: Vicenta Aly, FNP Chief Complaint:  Chief Complaint  Patient presents with  . Emesis    HPI: [name] is a 25 y.o. female with medical history significant of IDDM, cyclic vomiting believed to be due to diabetic gastroparesis.  Patient presents to the ED with c/o 8 hours of severe nausea and vomiting.  Some blood mixed into vomit.  Feels same as prior gastroparesis attacks she states.  Takes reglan at home but says phenergan works better.  ED Course: In ED patient given reglan, morphine, phenergan.  Initially not in DKA, she does end up going in to DKA during the ED visit.  Review of Systems: As per HPI otherwise 10 point review of systems negative.    Past Medical History:  Diagnosis Date  . Anxiety   . Arthritis   . Asthma   . Diabetes mellitus 2007   IDDM.  poorly controlled, multiple admits with DKA  . Gallstones   . Heart murmur   . Hepatic steatosis 11/26/2014   and hepatomegaly  . Hypertension    NOT CURRENTLY ON ANY BP MED  . Liver mass 11/26/2014  . Pancreatitis, acute 11/26/2014    Past Surgical History:  Procedure Laterality Date  . CHOLECYSTECTOMY N/A 02/11/2015   Procedure: LAPAROSCOPIC CHOLECYSTECTOMY WITH INTRAOPERATIVE CHOLANGIOGRAM;  Surgeon: Greer Pickerel, MD;  Location: [facility];  Service: General;  Laterality: N/A;  . ESOPHAGOGASTRODUODENOSCOPY (EGD) WITH PROPOFOL Left 09/20/2014   Procedure: ESOPHAGOGASTRODUODENOSCOPY (EGD) WITH PROPOFOL;  Surgeon: Arta Silence, MD;  Location: [facility] END[facility];  Service: Endoscopy;  Laterality: Left;  . WISDOM TOOTH EXTRACTION       reports that she has never smoked. She has never used smokeless tobacco. She reports that she uses drugs, including Marijuana. She reports that she does not drink alcohol.  Allergies  Allergen Reactions  . Peanut-Containing Drug Products Swelling    Swelling of mouth, lips  . Strawberry  Extract Swelling    Swelling of mouth, lips  . Ultram [Tramadol] Itching    Family History  Problem Relation Age of Onset  . Heart disease Maternal Grandmother   . Heart disease Maternal Grandfather   . Diabetes Mother   . Hyperlipidemia Mother   . Hypertension Father   . Heart disease Father   . Hypertension Paternal Grandmother   . Cancer Paternal Grandfather       Prior to Admission medications   Medication Sig Start Date End Date Taking? Authorizing Provider  albuterol (PROVENTIL HFA;VENTOLIN HFA) 108 (90 BASE) MCG/ACT inhaler Inhale 2 puffs into the lungs every 6 (six) hours as needed for shortness of breath.    Historical Provider, MD  albuterol (PROVENTIL) (2.5 MG/3ML) 0.083% nebulizer solution Take 2.5 mg by nebulization every 6 (six) hours as needed for shortness of breath.    Historical Provider, MD  budesonide-formoterol (SYMBICORT) 160-4.5 MCG/ACT inhaler Inhale 2 puffs into the lungs daily.     Historical Provider, MD  escitalopram (LEXAPRO) 10 MG tablet Take 5 mg by mouth daily. 06/20/15 06/19/16  Historical Provider, MD  Insulin Glargine (BASAGLAR KWIKPEN) 100 UNIT/ML SOPN Inject 0.2 mLs (20 Units total) into the skin at bedtime. 11/23/15   Robbie Lis, MD  metoCLOPramide (REGLAN) 10 MG tablet Take 1 tablet (10 mg total) by mouth 3 (three) times daily before meals. 11/06/15   Janece Canterbury, MD  montelukast (SINGULAIR) 10 MG tablet Take 10 mg by mouth daily.  Historical Provider, MD  potassium chloride SA (K-DUR,KLOR-CON) 20 MEQ tablet Take 1 tablet (20 mEq total) by mouth daily. 11/23/15   Robbie Lis, MD  promethazine (PHENERGAN) 25 MG tablet Take 1 tablet (25 mg total) by mouth every 6 (six) hours as needed for refractory nausea / vomiting. 11/06/15   Janece Canterbury, MD    Physical Exam: Vitals:   01/17/16 0112 01/17/16 0300 01/17/16 0330 01/17/16 0400  BP: 137/79 142/89 139/88 133/86  Pulse: 116     Resp: 19 17 18 16   Temp:      TempSrc:      SpO2: 100%           Constitutional: NAD, calm, comfortable Eyes: PERRL, lids and conjunctivae normal ENMT: Mucous membranes are moist. Posterior pharynx clear of any exudate or lesions.Normal dentition.  Neck: normal, supple, no masses, no thyromegaly Respiratory: clear to auscultation bilaterally, no wheezing, no crackles. Normal respiratory effort. No accessory muscle use.  Cardiovascular: Regular rate and rhythm, no murmurs / rubs / gallops. No extremity edema. 2+ pedal pulses. No carotid bruits.  Abdomen: no tenderness, no masses palpated. No hepatosplenomegaly. Bowel sounds positive.  Musculoskeletal: no clubbing / cyanosis. No joint deformity upper and lower extremities. Good ROM, no contractures. Normal muscle tone.  Skin: no rashes, lesions, ulcers. No induration Neurologic: CN 2-12 grossly intact. Sensation intact, DTR normal. Strength 5/5 in all 4.  Psychiatric: Normal judgment and insight. Alert and oriented x 3. Normal mood.    Labs on Admission: I have personally reviewed following labs and imaging studies  CBC:  Recent Labs Lab 01/16/16 2226 01/17/16 0609  WBC 9.3  --   HGB 14.4 15.6*  HCT 42.3 46.0  MCV 86.0  --   PLT 472*  --    Basic Metabolic Panel:  Recent Labs Lab 01/16/16 2226 01/17/16 0609  NA 144 139  K 3.7 5.6*  CL 106 115*  CO2 22  --   GLUCOSE 163* 472*  BUN 16 17  CREATININE 0.83 0.40*  CALCIUM 10.1  --    GFR: CrCl cannot be calculated (Unknown ideal weight.). Liver Function Tests:  Recent Labs Lab 01/16/16 2226  AST 31  ALT 16  ALKPHOS 108  BILITOT 1.2  PROT 8.7*  ALBUMIN 4.7    Recent Labs Lab 01/16/16 2226  LIPASE 11   No results for input(s): AMMONIA in the last 168 hours. Coagulation Profile: No results for input(s): INR, PROTIME in the last 168 hours. Cardiac Enzymes: No results for input(s): CKTOTAL, CKMB, CKMBINDEX, TROPONINI in the last 168 hours. BNP (last 3 results) No results for input(s): PROBNP in the last 8760  hours. HbA1C: No results for input(s): HGBA1C in the last 72 hours. CBG:  Recent Labs Lab 01/17/16 0522  GLUCAP 430*   Lipid Profile: No results for input(s): CHOL, HDL, LDLCALC, TRIG, CHOLHDL, LDLDIRECT in the last 72 hours. Thyroid Function Tests: No results for input(s): TSH, T4TOTAL, FREET4, T3FREE, THYROIDAB in the last 72 hours. Anemia Panel: No results for input(s): VITAMINB12, FOLATE, FERRITIN, TIBC, IRON, RETICCTPCT in the last 72 hours. Urine analysis:    Component Value Date/Time   COLORURINE YELLOW 11/21/2015 1546   APPEARANCEUR CLOUDY (A) 11/21/2015 1546   LABSPEC 1.013 11/21/2015 1546   PHURINE 6.5 11/21/2015 1546   GLUCOSEU >1000 (A) 11/21/2015 1546   GLUCOSEU >=1000 11/07/2012 1205   HGBUR TRACE (A) 11/21/2015 1546   BILIRUBINUR NEGATIVE 11/21/2015 1546   KETONESUR 40 (A) 11/21/2015 1546   PROTEINUR NEGATIVE  11/21/2015 1546   UROBILINOGEN 0.2 11/24/2014 1045   NITRITE NEGATIVE 11/21/2015 1546   LEUKOCYTESUR TRACE (A) 11/21/2015 1546   Sepsis Labs: @LABRCNTIP (procalcitonin:4,lacticidven:4) )No results found for this or any previous visit (from the past 240 hour(s)).   Radiological Exams on Admission: No results found.  EKG: Independently reviewed.  Assessment/Plan Principal Problem:   DKA, type 1 ([place]) Active Problems:   Diabetic gastroparesis ([place])   Type 1 diabetes mellitus with hyperglycemia ([place])    1. DKA type - 1. DKA pathway 2. IVF 3. Insulin gtt 4. BMP q4h 2. Diabetic gastroparesis - 1. Scheduled reglan 2. PRN phenergan 3. Avoiding narcotics   DVT prophylaxis: SCDs due to likely mallory-weis tear Code Status: Full Family Communication: No family in room Consults called: None Admission status: Admit to inpatient   Etta Quill DO [facility] Pager [phone] from 7PM-7AM  If 7AM-7PM, please contact the day physician for the patient [email or web address] Password [email or web address]  01/17/2016, 6:42 AM

## 2016-01-17 NOTE — Progress Notes (Signed)
Paged MD about pt. Glucose level and insulin gtt turned to 0 per glucose stabilizer protocol.

## 2016-01-17 NOTE — Progress Notes (Signed)
Inpatient Diabetes Program Recommendations  AACE/ADA: New Consensus Statement on Inpatient Glycemic Control (2015)  Target Ranges:  Prepandial:   less than 140 mg/dL      Peak postprandial:   less than 180 mg/dL (1-2 hours)      Critically ill patients:  140 - 180 mg/dL   Results for [name], [name] (MRN [id number]) as of 01/17/2016 09:59  Ref. Range 01/17/2016 05:55  Sodium Latest Ref Range: 135 - 145 mmol/L 140  Potassium Latest Ref Range: 3.5 - 5.1 mmol/L 5.8 (H)  Chloride Latest Ref Range: 101 - 111 mmol/L 110  CO2 Latest Ref Range: 22 - 32 mmol/L <7 (L)  BUN Latest Ref Range: 6 - 20 mg/dL 16  Creatinine Latest Ref Range: 0.44 - 1.00 mg/dL 0.89  Calcium Latest Ref Range: 8.9 - 10.3 mg/dL 9.4  EGFR (Non-African Amer.) Latest Ref Range: >60 mL/min >60  EGFR (African American) Latest Ref Range: >60 mL/min >60  Glucose Latest Ref Range: 65 - 99 mg/dL 450 (H)  Anion gap Latest Ref Range: 5 - 15  NOT CALCULATED    Admit with: Vomiting/ DKA  History: T1DM, Gastroparesis  Home DM Meds: Basaglar 20 units QHS       Novolog 0-20 units QID  Current Insulin Orders: IV Insulin drip (started at 7am)      -Patient saw her Endocrinologist (Dr. Francetta Found with [facility] in [place]) on 12/27/15.  At that visit pt was given the following instructions:  Patient Instructions - Gerome Apley, MD - 12/27/2015 10:14 AM EST 1. Lower Basaglar Kwikpen from 20 to 16 units at bedtime. Can raise back up to 18 units if fasting blood sugars above 120. Will switch to Lantus Solostar when you run out of Basaglar. 2. Continue Novolog Flexpen 1 unit per 10 grams carbohydrates plus sliding scale. 3. Try to limit meals to 45-60 grams carbohydrates and snacks to under 15 grams. 4. Check blood sugars before and 1-2 hours after each meal. Bring glucose logs and meter to each visit.    -Patient well known to the Inpatient DM Program.  This is pt's 13th admission to [facility] since January  of 2017.    --Will follow patient during hospitalization--  Wyn Quaker RN, MSN, CDE Diabetes Coordinator Inpatient Glycemic Control Team Team Pager: [phone] (8a-5p)

## 2016-01-17 NOTE — Progress Notes (Signed)
PROGRESS NOTE  [name] [id number] DOB: [birth year] DOA: 01/16/2016 PCP: Vicenta Aly, FNP  Brief History:  25 year old female with a history of diabetes mellitus type 1, recent pancreatitis, liver adenomas, anxiety, hepatic steatosis, asthma presented with 1-2 day history of nausea, vomiting. The patient has had numerous admissions in the past 6 months secondary to DKA as well as for N/V from presumptive gastroparesis. During her admissions, it has been characteristic for slow recovery due to slow recovering metabolic acidosis which is initially gapped and subsequently nongapped. In addition, her admissions have been characterized by profound volume depletion requiring significant fluid resuscitation which subsequently resulted in improvement in the patient's sinus tachycardia and metabolic acidosis. Finally, her previous admissions have been characterized by opioid dependence for her "abdominal pain"which has resulted in exacerbation of her presumed gastroparesis resulting in slow recovery of her nausea and vomiting. However, when the patient's opioids are decreased in dose and frequency, the patient's vomiting improves. In addition when the patient's opioids are decreased, the patient has had a propensity to leave [place] in the past.  The patient initially presented to the emergency department on 01/16/2016 secondary to nausea and vomiting. She claimed to have had at least 10 episodes of emesis, and subsequently developed a small amount of hematemesis. Initially, her labs on 01/16/2016 at 2226 showed serum glucose 163 with anion gap of 16.  Six hours later, her serum glucose was 450 with anion gap >23.  Assessment/Plan: DKA in DM type 1 -patient started on IV insulin with q 1 hour CBG check and q 4 hour BMPs -pt started on aggressive fluid resuscitation -Electrolytes were monitored and repleted -transitioned to [facility] insulin once anion gap is closed -she  normally takes insulin glargine 20 units q hs, which she claims compliance -11/10/2015 HbA1C--10.3  Nausea and vomiting/Abdominal pain -likely gastroparesis related to her DM  -11/02/2015 CT abdomen pelvis--stable hepatic masses, normal pancreas, normal bowel wall thickening or inflammation -04/21/2015 CT abdomen and pelvis--stable hepatic masses, normal pancreas, normal bowel wall thickening or inflammation, normal spleen and pancreas -09/23/2014 EGD--diffuse gastritis without clear explanation of the patient's hematemesis -Continue IV fluid, metoclopramide -minimize opioids -scheduled zofran q 6hrs -continue PPI  Hematemesis -Hemoglobin appears to be stable, likely secondary to forceful vomiting, 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 -Patient had same complaint on numerous previous admissions -09/23/2014 EGD--diffuse gastritis without clear explanation of the patient's hematemesis -Baseline hemoglobin 11-12 -serial CBC  DM type I, uncontrolled -Continue fluid resuscitation -11/10/2015 hemoglobin A1c 10.3  Asthma -No wheezing or stridor -Stable on room air -Continue LABA--pt refuses -continue singulair  Sinus tachycardia/Dehydration -Secondary to stress response from the patient's volume depletion, vomiting, and pain -As discussed, aggressive fluid resuscitation and conservative measures usually results in an improvement -Echocardiogram 05/24/2014 showed EF [id number], grade 1 diastolic dysfunction -[id number]  Dyspnea -Likely secondary to metabolic acidosis -Chest x-ray  Pyuria -Send urine culture -will not start antibiotics presently as pt is afebrile, hemodynamically stable with normal WBC and lactate   Disposition Plan:   Home in 2-3 days  Family Communication:   No Family at bedside--Total time spent 35 minutes.  Greater than 50% spent face to face counseling and coordinating care. 0755 to 0830  Consultants:  none  Code Status:  FULL  DVT  Prophylaxis:   [place] Lovenox   Procedures: As Listed in Progress Note Above  Antibiotics: None    Subjective: Patient states that she has some nausea but states that her  emesis is a little bit better. He is complaining of abdominal pain. Denies any dysuria, hematuria, diarrhea, hematochezia, fevers, chills, chest pain. She states that she has some shortness of breath.  Objective: Vitals:   01/17/16 0330 01/17/16 0400 01/17/16 0500 01/17/16 0530  BP: 139/88 133/86 132/87 109/60  Pulse:   (!) 132 (!) 131  Resp: 18 16 25 16   Temp:      TempSrc:      SpO2:   99% 99%    Intake/Output Summary (Last 24 hours) at 01/17/16 0754 Last data filed at 01/17/16 0301  Gross per 24 hour  Intake             1000 ml  Output                0 ml  Net             1000 ml   Weight change:  Exam:   General:  Pt is alert, follows commands appropriately, not in acute distress  HEENT: No icterus, No thrush, No neck mass, [place]/AT  Cardiovascular: RRR, S1/S2, no rubs, no gallops  Respiratory: CTA bilaterally, no wheezing, no crackles, no rhonchi  Abdomen: Soft/+BS, non tender, non distended, no guarding  Extremities: No edema, No lymphangitis, No petechiae, No rashes, no synovitis   Data Reviewed: I have personally reviewed following labs and imaging studies Basic Metabolic Panel:  Recent Labs Lab 01/16/16 2226 01/17/16 0555 01/17/16 0609  NA 144 140 139  K 3.7 5.8* 5.6*  CL 106 110 115*  CO2 22 <7*  --   GLUCOSE 163* 450* 472*  BUN 16 16 17   CREATININE 0.83 0.89 0.40*  CALCIUM 10.1 9.4  --    Liver Function Tests:  Recent Labs Lab 01/16/16 2226  AST 31  ALT 16  ALKPHOS 108  BILITOT 1.2  PROT 8.7*  ALBUMIN 4.7    Recent Labs Lab 01/16/16 2226  LIPASE 11   No results for input(s): AMMONIA in the last 168 hours. Coagulation Profile: No results for input(s): INR, PROTIME in the last 168 hours. CBC:  Recent Labs Lab 01/16/16 2226 01/17/16 0609  WBC 9.3  --     HGB 14.4 15.6*  HCT 42.3 46.0  MCV 86.0  --   PLT 472*  --    Cardiac Enzymes: No results for input(s): CKTOTAL, CKMB, CKMBINDEX, TROPONINI in the last 168 hours. BNP: Invalid input(s): POCBNP CBG:  Recent Labs Lab 01/17/16 0522  GLUCAP 430*   HbA1C: No results for input(s): HGBA1C in the last 72 hours. Urine analysis:    Component Value Date/Time   COLORURINE YELLOW 01/17/2016 0703   APPEARANCEUR CLOUDY (A) 01/17/2016 0703   LABSPEC 1.021 01/17/2016 0703   PHURINE 6.0 01/17/2016 0703   GLUCOSEU >=500 (A) 01/17/2016 0703   GLUCOSEU >=1000 11/07/2012 1205   HGBUR NEGATIVE 01/17/2016 0703   BILIRUBINUR NEGATIVE 01/17/2016 0703   KETONESUR 80 (A) 01/17/2016 0703   PROTEINUR 30 (A) 01/17/2016 0703   UROBILINOGEN 0.2 11/24/2014 1045   NITRITE NEGATIVE 01/17/2016 0703   LEUKOCYTESUR MODERATE (A) 01/17/2016 0703   Sepsis Labs: @LABRCNTIP (procalcitonin:4,lacticidven:4) )No results found for this or any previous visit (from the past 240 hour(s)).   Scheduled Meds: . escitalopram  5 mg Oral Daily  . metoCLOPramide (REGLAN) injection  10 mg Intravenous Q6H  . mometasone-formoterol  2 puff Inhalation BID  . montelukast  10 mg Oral Daily   Continuous Infusions: . sodium chloride    . sodium chloride    .  dextrose 5 % and 0.45% NaCl    . insulin (NOVOLIN-R) infusion      Procedures/Studies: No results found.  Treson Laura, DO  [facility] Pager [phone]  If 7PM-7AM, please contact night-coverage [email or web address] Password [email or web address] 01/17/2016, 7:54 AM   LOS: 0 days

## 2016-01-17 NOTE — Progress Notes (Signed)
Pt. Complain of abdominal pain 8/10. RN offered PO pain med that was ordered and pt. Stated that she could not take med due to her being nauseated. Pt. Received nausea medication. MD made aware. Will continue to monitor.

## 2016-01-17 NOTE — ED Provider Notes (Addendum)
[facility] DEPT Provider Note   CSN: [id number] Arrival date & time: 01/16/16  2124  By signing my name below, I, [name], attest that this documentation has been prepared under the direction and in the presence of Varney Biles, MD. Electronically Signed: [name], ER Scribe. 08/31/2015. 1:03 AM.  History   Chief Complaint Chief Complaint  Patient presents with  . Emesis   The history is provided by the patient. No language interpreter was used.  Emesis   Associated symptoms include abdominal pain. Pertinent negatives include no chills and no fever.    HPI Comments: [name] is a 25 y.o. female who presents to the Emergency Department complaining of eight hours of severe nausea and vomiting with 10+ events. She has some blood mixed into her vomit. She has a PMHx of DM and gastroparesis and states this feels the same as past gastroparesis attacks. She takes reglan at home but states that phenergan works better. She has been prescribed morphine in the past to manage her pain during a gastroparesis.   Past Medical History:  Diagnosis Date  . Anxiety   . Arthritis   . Asthma   . Diabetes mellitus 2007   IDDM.  poorly controlled, multiple admits with DKA  . Gallstones   . Heart murmur   . Hepatic steatosis 11/26/2014   and hepatomegaly  . Hypertension    NOT CURRENTLY ON ANY BP MED  . Liver mass 11/26/2014  . Pancreatitis, acute 11/26/2014    Patient Active Problem List   Diagnosis Date Noted  . Diarrhea 11/09/2015  . Acute urinary retention   . Gastroparesis due to DM ([place]) 07/10/2015  . GERD (gastroesophageal reflux disease) 07/10/2015  . Depression with anxiety 07/10/2015  . Uncontrolled type 1 diabetes mellitus with hyperglycemia ([place])   . Gastroparesis 06/22/2015  . Altered mental status 06/22/2015  . Volume depletion 06/10/2015  . Protein-calorie malnutrition, severe 06/10/2015  . Hyperglycemia   . Intractable vomiting with nausea   .  Elevated bilirubin   . Type 1 diabetes mellitus with hyperglycemia ([place]) 05/29/2015  . Hematemesis with nausea   . Intractable vomiting 05/28/2015  . Intractable nausea and vomiting 05/28/2015  . Abdominal pain in female   . Abdominal pain 05/24/2015  . Hypertension 05/24/2015  . Dehydration   . Diabetic gastroparesis ([place])   . Chronic diastolic heart failure ([place]) 04/11/2015  . Hematemesis 04/08/2015  . DKA (diabetic ketoacidoses) ([place]) 03/22/2015  . Nausea vomiting and diarrhea 03/22/2015  . Nausea & vomiting   . Diabetic ketoacidosis without coma associated with type 1 diabetes mellitus ([place])   . S/P laparoscopic cholecystectomy 02/11/2015  . Status post laparoscopic cholecystectomy 02/11/2015  . Postextubation stridor   . Nausea and vomiting 11/27/2014  . Pancreatitis, acute 11/26/2014  . Volume overload 11/26/2014  . Hypokalemia 11/26/2014  . Hepatic steatosis 11/26/2014  . Liver mass 11/26/2014  . Sepsis ([place]) 11/25/2014  . Sinus tachycardia 11/25/2014  . Hypomagnesemia 11/25/2014  . Hypophosphatemia 11/25/2014  . Elevated amylase and lipase 11/25/2014  . DKA, type 1 ([place]) 11/24/2014  . Elevated LFTs 11/24/2014  . Acute kidney injury ([place]) 11/24/2014  . Migraine headache 11/24/2014  . Asthma 06/29/2012  . Uncontrolled type 1 diabetes mellitus ([place]) 06/19/2010  . Goiter, unspecified 06/19/2010    Past Surgical History:  Procedure Laterality Date  . CHOLECYSTECTOMY N/A 02/11/2015   Procedure: LAPAROSCOPIC CHOLECYSTECTOMY WITH INTRAOPERATIVE CHOLANGIOGRAM;  Surgeon: Greer Pickerel, MD;  Location: [facility] ORS;  Service: General;  Laterality:  N/A;  . ESOPHAGOGASTRODUODENOSCOPY (EGD) WITH PROPOFOL Left 09/20/2014   Procedure: ESOPHAGOGASTRODUODENOSCOPY (EGD) WITH PROPOFOL;  Surgeon: Arta Silence, MD;  Location: [facility] ENDOSCOPY;  Service: Endoscopy;  Laterality: Left;  . WISDOM TOOTH EXTRACTION      OB History    Gravida Para Term Preterm AB Living   2 1 0 1 1 1    SAB TAB  Ectopic Multiple Live Births   0 1 0 0 1       Home Medications    Prior to Admission medications   Medication Sig Start Date End Date Taking? Authorizing Provider  albuterol (PROVENTIL HFA;VENTOLIN HFA) 108 (90 BASE) MCG/ACT inhaler Inhale 2 puffs into the lungs every 6 (six) hours as needed for shortness of breath.    Historical Provider, MD  albuterol (PROVENTIL) (2.5 MG/3ML) 0.083% nebulizer solution Take 2.5 mg by nebulization every 6 (six) hours as needed for shortness of breath.    Historical Provider, MD  budesonide-formoterol (SYMBICORT) 160-4.5 MCG/ACT inhaler Inhale 2 puffs into the lungs daily.     Historical Provider, MD  escitalopram (LEXAPRO) 10 MG tablet Take 5 mg by mouth daily. 06/20/15 06/19/16  Historical Provider, MD  Insulin Glargine (BASAGLAR KWIKPEN) 100 UNIT/ML SOPN Inject 0.2 mLs (20 Units total) into the skin at bedtime. 11/23/15   Robbie Lis, MD  metoCLOPramide (REGLAN) 10 MG tablet Take 1 tablet (10 mg total) by mouth 3 (three) times daily before meals. 11/06/15   Janece Canterbury, MD  montelukast (SINGULAIR) 10 MG tablet Take 10 mg by mouth daily.    Historical Provider, MD  potassium chloride SA (K-DUR,KLOR-CON) 20 MEQ tablet Take 1 tablet (20 mEq total) by mouth daily. 11/23/15   Robbie Lis, MD  promethazine (PHENERGAN) 25 MG tablet Take 1 tablet (25 mg total) by mouth every 6 (six) hours as needed for refractory nausea / vomiting. 11/06/15   Janece Canterbury, MD    Family History Family History  Problem Relation Age of Onset  . Heart disease Maternal Grandmother   . Heart disease Maternal Grandfather   . Diabetes Mother   . Hyperlipidemia Mother   . Hypertension Father   . Heart disease Father   . Hypertension Paternal Grandmother   . Cancer Paternal Grandfather     Social History Social History  Substance Use Topics  . Smoking status: Never Smoker  . Smokeless tobacco: Never Used  . Alcohol use No     Allergies   Peanut-containing drug  products; Strawberry extract; and Ultram [tramadol]   Review of Systems Review of Systems  Constitutional: Negative for chills and fever.  Gastrointestinal: Positive for abdominal pain, nausea and vomiting.  A complete 10 system review of systems was obtained and all systems are negative except as noted in the HPI and PMH.    ROS 10 Systems reviewed and are negative for acute change except as noted in the HPI.     Physical Exam Updated Vital Signs BP 133/86   Pulse 116   Temp 98.3 F (36.8 C) (Oral)   Resp 16   LMP  (LMP Unknown)   SpO2 100%   Physical Exam  Constitutional: She is oriented to person, place, and time. She appears well-developed and well-nourished. No distress.  HENT:  Head: Normocephalic and atraumatic.  Eyes: Pupils are equal, round, and reactive to light.  Neck: Neck supple.  Cardiovascular: Normal rate.   Pulmonary/Chest: Effort normal. No respiratory distress.  Abdominal: There is tenderness. There is no rebound and no guarding.  Upper quadrant tenderness  Musculoskeletal: Normal range of motion.  Neurological: She is alert and oriented to person, place, and time. Coordination normal.  Skin: Skin is warm and dry. She is not diaphoretic.  Psychiatric: She has a normal mood and affect. Her behavior is normal.  Nursing note and vitals reviewed.    ED Treatments / Results  COORDINATION OF CARE: 7:15 AM Discussed treatment plan with pt at bedside and pt agreed to plan.  Labs (all labs ordered are listed, but only abnormal results are displayed) Labs Reviewed  COMPREHENSIVE METABOLIC PANEL - Abnormal; Notable for the following:       Result Value   Glucose, Bld 163 (*)    Total Protein 8.7 (*)    Anion gap 16 (*)    All other components within normal limits  CBC - Abnormal; Notable for the following:    Platelets 472 (*)    All other components within normal limits  BASIC METABOLIC PANEL - Abnormal; Notable for the following:    Potassium 5.8  (*)    CO2 <7 (*)    Glucose, Bld 450 (*)    All other components within normal limits  BLOOD GAS, VENOUS - Abnormal; Notable for the following:    pH, Ven 7.107 (*)    pCO2, Ven 20.5 (*)    pO2, Ven 59.1 (*)    Bicarbonate 6.2 (*)    Acid-base deficit 22.8 (*)    All other components within normal limits  CBG MONITORING, ED - Abnormal; Notable for the following:    Glucose-Capillary 430 (*)    All other components within normal limits  I-STAT CHEM 8, ED - Abnormal; Notable for the following:    Potassium 5.6 (*)    Chloride 115 (*)    Creatinine, Ser 0.40 (*)    Glucose, Bld 472 (*)    Hemoglobin 15.6 (*)    All other components within normal limits  LIPASE, BLOOD  URINALYSIS, ROUTINE W REFLEX MICROSCOPIC  BASIC METABOLIC PANEL  BASIC METABOLIC PANEL  BASIC METABOLIC PANEL  BASIC METABOLIC PANEL  I-STAT BETA HCG BLOOD, ED ([facility], [facility], [facility] ONLY)  I-STAT CG4 LACTIC ACID, ED  CBG MONITORING, ED    EKG  EKG Interpretation  Date/Time:  Friday January 17 2016 02:53:38 EST Ventricular Rate:  119 PR Interval:    QRS Duration: 83 QT Interval:  313 QTC Calculation: 441 R Axis:   79 Text Interpretation:  Sinus tachycardia Biatrial enlargement No acute changes No old tracing to compare Confirmed by Kathrynn Humble, MD, Thelma Comp [id number]) on 01/17/2016 3:39:11 AM       Radiology No results found.  Procedures .Critical Care Performed by: Varney Biles Authorized by: Varney Biles   Critical care provider statement:    Critical care time (minutes):  40   Critical care time was exclusive of:  Separately billable procedures and treating other patients   Critical care was necessary to treat or prevent imminent or life-threatening deterioration of the following conditions:  Metabolic crisis   Critical care was time spent personally by me on the following activities:  Blood draw for specimens, development of treatment plan with patient or surrogate, discussions with consultants,  evaluation of patient's response to treatment, examination of patient, obtaining history from patient or surrogate, ordering and performing treatments and interventions, ordering and review of laboratory studies, ordering and review of radiographic studies, pulse oximetry, re-evaluation of patient's condition and review of old charts   (including critical care time)  Medications Ordered  in ED Medications  lactated ringers bolus 1,000 mL (1,000 mLs Intravenous New Bag/Given 01/17/16 0519)  escitalopram (LEXAPRO) tablet 5 mg (not administered)  mometasone-formoterol (DULERA) 200-5 MCG/ACT inhaler 2 puff (not administered)  albuterol (PROVENTIL) (2.5 MG/3ML) 0.083% nebulizer solution 2.5 mg (not administered)  albuterol (PROVENTIL HFA;VENTOLIN HFA) 108 (90 Base) MCG/ACT inhaler 2 puff (not administered)  montelukast (SINGULAIR) tablet 10 mg (not administered)  metoCLOPramide (REGLAN) injection 10 mg (not administered)  promethazine (PHENERGAN) injection 25 mg (not administered)  0.9 %  sodium chloride infusion (not administered)  dextrose 5 %-0.45 % sodium chloride infusion (not administered)  insulin regular (NOVOLIN R,HUMULIN R) 250 Units in sodium chloride 0.9 % 250 mL (1 Units/mL) infusion (not administered)  0.9 %  sodium chloride infusion (not administered)  sodium chloride 0.9 % bolus 1,000 mL (0 mLs Intravenous Stopped 01/17/16 0301)  metoCLOPramide (REGLAN) injection 10 mg (10 mg Intravenous Given 01/17/16 0027)  morphine 4 MG/ML injection 4 mg (4 mg Intravenous Given 01/17/16 0151)  promethazine (PHENERGAN) injection 25 mg (25 mg Intravenous Given 01/17/16 0151)  haloperidol lactate (HALDOL) injection 5 mg (5 mg Intravenous Given 01/17/16 0351)  morphine 4 MG/ML injection 4 mg (4 mg Intravenous Given 01/17/16 0519)  promethazine (PHENERGAN) injection 25 mg (25 mg Intravenous Given 01/17/16 0540)  famotidine (PEPCID) IVPB 20 mg premix (20 mg Intravenous New Bag/Given 01/17/16 0519)      Initial Impression / Assessment and Plan / ED Course  I have reviewed the triage vital signs and the nursing notes.  Pertinent labs & imaging results that were available during my care of the patient were reviewed by me and considered in my medical decision making (see chart for details).  Clinical Course as of Jan 16 714  Fri Jan 17, 2016  0327 Pt is still having nausea and pain. Dry heaving still present. We will give haldol. Oral challenge has been initiated.  [AN]  0510 PO challenge failed. Pt's HR in the 140s with just getting up. We will give more iv fluids. S/p iv pain meds, nausea meds and haldol.   [AN]  [id number] CBG is elevated. Pt took her lantus at noon, her typical time. Hospitalist aware that we have ordered BMP and lactate.  [AN]    Clinical Course User Index [AN] Varney Biles, MD    Pt comes in with cc of n/v/diarrhea/abd pain.  Blood in the emesis - likely mallory weiss tear. EGD from 2016 was neg for gastritis.  N/V/abd pain - consistent with gastroparesis per patient, and clinically I agree. Mild AG - likely due to dehydration/lactoc acidosis, and not DKA. We will hydrate.   Final Clinical Impressions(s) / ED Diagnoses   Final diagnoses:  Gastroparesis  Dehydration  Diabetic ketoacidosis without coma associated with type 1 diabetes mellitus (HCC)    New Prescriptions New Prescriptions   No medications on file     I personally performed the services described in this documentation, which was scribed in my presence. The recorded information has been reviewed and is accurate.       Varney Biles, MD 01/17/16 [place], MD 01/17/16 [id number]    Varney Biles, MD 01/17/16 [id number]

## 2016-01-18 DIAGNOSIS — E1065 Type 1 diabetes mellitus with hyperglycemia: Secondary | ICD-10-CM

## 2016-01-18 DIAGNOSIS — E1143 Type 2 diabetes mellitus with diabetic autonomic (poly)neuropathy: Secondary | ICD-10-CM

## 2016-01-18 DIAGNOSIS — K3184 Gastroparesis: Secondary | ICD-10-CM

## 2016-01-18 DIAGNOSIS — E876 Hypokalemia: Secondary | ICD-10-CM

## 2016-01-18 DIAGNOSIS — E1010 Type 1 diabetes mellitus with ketoacidosis without coma: Principal | ICD-10-CM

## 2016-01-18 DIAGNOSIS — K219 Gastro-esophageal reflux disease without esophagitis: Secondary | ICD-10-CM

## 2016-01-18 DIAGNOSIS — G894 Chronic pain syndrome: Secondary | ICD-10-CM

## 2016-01-18 LAB — BASIC METABOLIC PANEL
Anion gap: 10 (ref 5–15)
Anion gap: 10 (ref 5–15)
BUN: 11 mg/dL (ref 6–20)
BUN: 10 mg/dL (ref 6–20)
CALCIUM: 8.9 mg/dL (ref 8.9–10.3)
CHLORIDE: 108 mmol/L (ref 101–111)
CO2: 19 mmol/L — AB (ref 22–32)
CO2: 19 mmol/L — AB (ref 22–32)
Calcium: 9 mg/dL (ref 8.9–10.3)
Chloride: 109 mmol/L (ref 101–111)
Creatinine, Ser: 0.36 mg/dL — ABNORMAL LOW (ref 0.44–1.00)
Creatinine, Ser: 0.5 mg/dL (ref 0.44–1.00)
GFR calc Af Amer: >60 mL/min (ref >60)
GFR calc Af Amer: >60 mL/min (ref >60)
GFR calc non Af Amer: >60 mL/min (ref >60)
GLUCOSE: 141 mg/dL — AB (ref 65–99)
Glucose, Bld: 174 mg/dL — ABNORMAL HIGH (ref 65–99)
POTASSIUM: 3.4 mmol/L — AB (ref 3.5–5.1)
Potassium: 3.5 mmol/L (ref 3.5–5.1)
Sodium: 138 mmol/L (ref 135–145)
Sodium: 137 mmol/L (ref 135–145)

## 2016-01-18 LAB — CBC
HEMATOCRIT: 36.2 % (ref 36.0–46.0)
HEMOGLOBIN: 12.1 g/dL (ref 12.0–15.0)
MCH: 29.5 pg (ref 26.0–34.0)
MCHC: 33.4 g/dL (ref 30.0–36.0)
MCV: 88.3 fL (ref 78.0–100.0)
Platelets: 398 10*3/uL (ref 150–400)
RBC: 4.1 MIL/uL (ref 3.87–5.11)
RDW: 13 % (ref 11.5–15.5)
WBC: 9.4 10*3/uL (ref 4.0–10.5)

## 2016-01-18 LAB — GLUCOSE, CAPILLARY
GLUCOSE-CAPILLARY: 120 mg/dL — AB (ref 65–99)
GLUCOSE-CAPILLARY: 140 mg/dL — AB (ref 65–99)
GLUCOSE-CAPILLARY: 121 mg/dL — AB (ref 65–99)
GLUCOSE-CAPILLARY: 152 mg/dL — AB (ref 65–99)
GLUCOSE-CAPILLARY: 166 mg/dL — AB (ref 65–99)
GLUCOSE-CAPILLARY: 257 mg/dL — AB (ref 65–99)
Glucose-Capillary: 121 mg/dL — ABNORMAL HIGH (ref 65–99)
Glucose-Capillary: 123 mg/dL — ABNORMAL HIGH (ref 65–99)
Glucose-Capillary: 138 mg/dL — ABNORMAL HIGH (ref 65–99)
Glucose-Capillary: 154 mg/dL — ABNORMAL HIGH (ref 65–99)
Glucose-Capillary: 159 mg/dL — ABNORMAL HIGH (ref 65–99)
Glucose-Capillary: 176 mg/dL — ABNORMAL HIGH (ref 65–99)
Glucose-Capillary: 182 mg/dL — ABNORMAL HIGH (ref 65–99)
Glucose-Capillary: 184 mg/dL — ABNORMAL HIGH (ref 65–99)

## 2016-01-18 LAB — URINE CULTURE

## 2016-01-18 MED ORDER — POTASSIUM CHLORIDE 10 MEQ/100ML IV SOLN
10.0000 meq | INTRAVENOUS | Status: AC
  Administered 2016-01-18 (×3): 10 meq via INTRAVENOUS
  Filled 2016-01-18 (×3): qty 100

## 2016-01-18 MED ORDER — MAGNESIUM SULFATE 2 GM/50ML IV SOLN
2.0000 g | Freq: Once | INTRAVENOUS | Status: AC
  Administered 2016-01-18: 2 g via INTRAVENOUS
  Filled 2016-01-18: qty 50

## 2016-01-18 MED ORDER — METOPROLOL TARTRATE 5 MG/5ML IV SOLN
2.5000 mg | Freq: Once | INTRAVENOUS | Status: AC
  Administered 2016-01-18: 2.5 mg via INTRAVENOUS
  Filled 2016-01-18: qty 5

## 2016-01-18 MED ORDER — POTASSIUM CHLORIDE CRYS ER 20 MEQ PO TBCR: 40.0000 meq | EXTENDED_RELEASE_TABLET | Freq: Every day | ORAL | Status: DC

## 2016-01-18 MED ORDER — INSULIN ASPART 100 UNIT/ML ~~LOC~~ SOLN
0.0000 [IU] | Freq: Every day | SUBCUTANEOUS | Status: DC
  Administered 2016-01-18: 3 [IU] via SUBCUTANEOUS

## 2016-01-18 MED ORDER — SODIUM CHLORIDE 0.9 % IV SOLN: 30.0000 meq | Freq: Once | INTRAVENOUS | Status: DC

## 2016-01-18 MED ORDER — INSULIN GLARGINE 100 UNIT/ML ~~LOC~~ SOLN
20.0000 [IU] | Freq: Every day | SUBCUTANEOUS | Status: DC
  Administered 2016-01-18 – 2016-01-19 (×2): 20 [IU] via SUBCUTANEOUS
  Filled 2016-01-18 (×2): qty 0.2

## 2016-01-18 MED ORDER — PANTOPRAZOLE SODIUM 40 MG PO TBEC
40.0000 mg | DELAYED_RELEASE_TABLET | Freq: Two times a day (BID) | ORAL | Status: DC
  Administered 2016-01-18 – 2016-01-19 (×2): 40 mg via ORAL
  Filled 2016-01-18 (×2): qty 1

## 2016-01-18 MED ORDER — MORPHINE SULFATE (PF) 2 MG/ML IV SOLN
2.0000 mg | Freq: Once | INTRAVENOUS | Status: AC
  Administered 2016-01-18: 2 mg via INTRAVENOUS
  Filled 2016-01-18: qty 1

## 2016-01-18 MED ORDER — INSULIN ASPART 100 UNIT/ML ~~LOC~~ SOLN
0.0000 [IU] | Freq: Three times a day (TID) | SUBCUTANEOUS | Status: DC
  Administered 2016-01-18: 3 [IU] via SUBCUTANEOUS
  Administered 2016-01-18: 2 [IU] via SUBCUTANEOUS
  Administered 2016-01-19: 8 [IU] via SUBCUTANEOUS
  Administered 2016-01-19: 2 [IU] via SUBCUTANEOUS

## 2016-01-18 NOTE — Progress Notes (Signed)
PROGRESS NOTE  [name] [id number] DOB: August 20, 1990 DOA: 01/16/2016 PCP: Vicenta Aly, FNP  Brief History:  25 year old female with a history of diabetes mellitus type 1, recent pancreatitis, liver adenomas, anxiety, hepatic steatosis, asthma presented with 1-2 day history of nausea, vomiting. The patient has had numerous admissions in the past 6 months secondary to DKA as well as for N/V from presumptive gastroparesis. During her admissions, it has been characteristic for slow recovery due to slow recovering metabolic acidosis which is initially gapped and subsequently nongapped. In addition, her admissions have been characterized by profound volume depletion requiring significant fluid resuscitation which subsequently resulted in improvement in the patient's sinus tachycardia and metabolic acidosis. Finally, her previous admissions have been characterized by opioid dependence for her "abdominal pain"which has resulted in exacerbation of her presumed gastroparesis resulting in slow recovery of her nausea and vomiting. However, when the patient's opioids are decreased in dose and frequency, the patient's vomiting improves. In addition when the patient's opioids are decreased, the patient has had a propensity to leave [place] in the past. The patient initially presented to the emergency department on 01/16/2016 secondary to nausea and vomiting. She claimed to have had at least 10 episodes of emesis, and subsequently developed a small amount of hematemesis. Initially, her labs on 01/16/2016 at 2226 showed serum glucose 163 with anion gap of 16.  Six hours later, her serum glucose was 450 with anion gap >23.  Assessment/Plan: DKA in DM type 1 -Anion Gap is close, Bicarb 19 and CBG's < 180 in more than 3 occasion  -will transition to lantus and SSI -advance diet and stop D5 1/2 NS infusion -ok to continue saline IVF's -will replete her potassium and check  A1C  Nausea and vomiting/Abdominal pain -likely gastroparesis related to her DM  -most recent CT scan of her abdomen on 11/02/2015--stable hepatic masses, normal pancreas, normal bowel wall thickening or inflammation. 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 -Patient had same complaint on numerous previous admissions -last 09/23/2014 EGD--diffuse gastritis without clear explanation of the patient's hematemesis -Baseline hemoglobin 11-12, stable -will follow hgb trend -continue PPI  DM type I, uncontrolled -Continue fluid resuscitation -will check A1C -as mentioned above will start SSI and lantus -will adjust as needed -started on carb modified diet   Asthma -No wheezing or stridor -Stable on room air -Continue LABA and continue singulair  Sinus tachycardia/Dehydration -Secondary to stress response from the patient's volume depletion, vomiting, and pain -As discussed, aggressive fluid resuscitation and conservative measures usually results in an improvement -Echocardiogram 05/24/2014 showed EF Q000111Q, grade 1 diastolic dysfunction  Dyspnea -Likely secondary to metabolic acidosis -Chest x-ray w/o signs of acute infection  -she denies SOB today  Pyuria -Send urine culture -will not start antibiotics presently as pt is afebrile, hemodynamically stable and has normal WBC and lactate  Chronic pain syndrome -will use PO pain meds   Disposition Plan:   Home in 1-2 days; will transfer out of stepdown Family Communication:   No Family at bedside--Total time spent 35 minutes.  Greater than 50% spent face to face counseling and coordinating care.   Consultants:  none  Code Status:  FULL  DVT Prophylaxis:     Lovenox   Procedures: As Listed in Progress  Note Above  Antibiotics: None    Subjective: Patient states that she has some nausea and is complaining of abd pain. Patient requesting IV narcotics and refusing to take PO medications. Denies any dysuria, hematuria, diarrhea, hematochezia, fevers, chills, chest pain and SOB.    Objective: Vitals:   01/17/16 2338 01/18/16 0000 01/18/16 0200 01/18/16 0600  BP:  (!) 158/105 (!) 141/83 (!) 149/100  Pulse:  (!) 120 (!) 124 (!) 113  Resp:  19 (!) 21 20  Temp: 98.5 F (36.9 C)     TempSrc: Oral     SpO2:  99% 99% 98%  Weight:      Height:        Intake/Output Summary (Last 24 hours) at 01/18/16 0840 Last data filed at 01/18/16 0700  Gross per 24 hour  Intake          4503.14 ml  Output             1700 ml  Net          2803.14 ml   Weight change:  Exam:   General:  Pt is alert, follows commands appropriately, not in acute distress. Reports some nausea and abd pain. Anion Gap close and DKA resolved for now.  HEENT: No icterus, No thrush, No neck mass, [place]/AT  Cardiovascular: RRR, S1/S2, no rubs, no gallops  Respiratory: CTA bilaterally, no wheezing, no crackles, no rhonchi  Abdomen: Soft/+BS, non tender, non distended, no guarding  Extremities: No edema, No lymphangitis, No petechiae, No rashes, no synovitis   Data Reviewed: I have personally reviewed following labs and imaging studies  Basic Metabolic Panel:  Recent Labs Lab 01/17/16 1426 01/17/16 1820 01/17/16 2208 01/18/16 0201 01/18/16 0630  NA 135 134* 137 138 137  K 4.0 4.0 3.7 3.5 3.4*  CL 111 108 109 109 108  CO2 14* 15* 17* 19* 19*  GLUCOSE 129* 234* 177* 141* 174*  BUN 14 13 12 11 10   CREATININE 0.49 0.60 0.48 0.36* 0.50  CALCIUM 8.2* 8.5* 8.8* 8.9 9.0   Liver Function Tests:  Recent Labs Lab 01/16/16 2226  AST 31  ALT 16  ALKPHOS 108  BILITOT 1.2  PROT 8.7*  ALBUMIN 4.7    Recent Labs Lab 01/16/16 2226  LIPASE 11    CBC:  Recent Labs Lab 01/16/16 2226 01/17/16 0609 01/18/16 0201  WBC 9.3  --  9.4  HGB 14.4 15.6* 12.1  HCT 42.3 46.0 36.2  MCV 86.0  --  88.3  PLT 472*  --  398   Cardiac Enzymes: No results for input(s): CKTOTAL, CKMB, CKMBINDEX, TROPONINI in the last 168 hours. BNP: Invalid input(s): POCBNP CBG:  Recent Labs Lab 01/18/16 0434 01/18/16 0533 01/18/16 0634 01/18/16 0729 01/18/16 0833  GLUCAP 140* 182* 152* 121* 184*   HbA1C: No results for input(s): HGBA1C in the last 72 hours. Urine analysis:    Component Value Date/Time   COLORURINE YELLOW 01/17/2016 0703   APPEARANCEUR CLOUDY (A) 01/17/2016 0703   LABSPEC 1.021 01/17/2016 0703   PHURINE 6.0 01/17/2016 0703   GLUCOSEU >=500 (A) 01/17/2016 0703   GLUCOSEU >=1000 11/07/2012 1205   HGBUR NEGATIVE 01/17/2016 0703   BILIRUBINUR NEGATIVE 01/17/2016 0703   KETONESUR 80 (A) 01/17/2016 0703   PROTEINUR 30 (A) 01/17/2016 0703   UROBILINOGEN 0.2 11/24/2014 1045   NITRITE NEGATIVE 01/17/2016 0703   LEUKOCYTESUR MODERATE (A) 01/17/2016 0703   Sepsis Labs: @LABRCNTIP (procalcitonin:4,lacticidven:4) ) Recent Results (from the past 240 hour(s))  MRSA PCR Screening  Status: None   Collection Time: 01/17/16  9:02 AM  Result Value Ref Range Status   MRSA by PCR NEGATIVE NEGATIVE Final    Comment:        The GeneXpert MRSA Assay (FDA approved for NASAL specimens only), is one component of a comprehensive MRSA colonization surveillance program. It is not intended to diagnose MRSA infection nor to guide or monitor treatment for MRSA infections.      Scheduled Meds: . enoxaparin (LOVENOX) injection  40 mg Subcutaneous Q24H  . escitalopram  5 mg Oral Daily  . insulin aspart  0-15 Units Subcutaneous TID WC  . insulin aspart  0-5 Units Subcutaneous QHS  . insulin glargine  20 Units Subcutaneous Daily  . metoCLOPramide (REGLAN) injection  10 mg Intravenous Q6H  . mometasone-formoterol  2 puff Inhalation BID  .  montelukast  10 mg Oral Daily  .  morphine injection  2 mg Intravenous Once  . pantoprazole  40 mg Oral BID  . potassium chloride  40 mEq Oral Daily   Continuous Infusions: . sodium chloride Stopped (01/17/16 1051)  . insulin (NOVOLIN-R) infusion 1.2 Units/hr (01/18/16 0730)    Procedures/Studies: Dg Abd Acute W/chest  Result Date: 01/17/2016 CLINICAL DATA:  Intractable vomiting EXAM: DG ABDOMEN ACUTE W/ 1V CHEST COMPARISON:  Abdominal radiograph November 20, 2015 FINDINGS: PA chest: No edema or consolidation. The heart size and pulmonary vascularity are normal. No adenopathy. Supine and left lateral decubitus abdomen: There is no bowel dilatation or air-fluid level suggesting bowel obstruction. No free air. There is soft tissue fullness in the pelvis, possibly a distended urinary bladder. There are surgical clips in the right upper quadrant medially. There is a small phlebolith in the lower left pelvis. IMPRESSION: No bowel obstruction or free air. Soft tissue fullness in the pelvis. Question distended urinary bladder. If this structure does not represent a distended urinary bladder, pelvic mass must be of concern. Lungs clear. Electronically Signed   By: Lowella Grip III M.D.   On: 01/17/2016 10:43    Barton Dubois, MD  [facility] Pager [phone]  If 7PM-7AM, please contact night-coverage [email or web address] Password [email or web address] 01/18/2016, 8:40 AM   LOS: 1 day

## 2016-01-19 DIAGNOSIS — R112 Nausea with vomiting, unspecified: Secondary | ICD-10-CM

## 2016-01-19 DIAGNOSIS — F329 Major depressive disorder, single episode, unspecified: Secondary | ICD-10-CM

## 2016-01-19 DIAGNOSIS — E860 Dehydration: Secondary | ICD-10-CM

## 2016-01-19 DIAGNOSIS — F32A Depression, unspecified: Secondary | ICD-10-CM

## 2016-01-19 LAB — BASIC METABOLIC PANEL
ANION GAP: 12 (ref 5–15)
BUN: 7 mg/dL (ref 6–20)
CALCIUM: 8.3 mg/dL — AB (ref 8.9–10.3)
CO2: 22 mmol/L (ref 22–32)
CREATININE: 0.35 mg/dL — AB (ref 0.44–1.00)
Chloride: 104 mmol/L (ref 101–111)
GLUCOSE: 280 mg/dL — AB (ref 65–99)
Potassium: 3.2 mmol/L — ABNORMAL LOW (ref 3.5–5.1)
Sodium: 138 mmol/L (ref 135–145)

## 2016-01-19 LAB — HEMOGLOBIN A1C
Hgb A1c MFr Bld: 10.1 % — ABNORMAL HIGH (ref 4.8–5.6)
MEAN PLASMA GLUCOSE: 243 mg/dL

## 2016-01-19 LAB — GLUCOSE, CAPILLARY
Glucose-Capillary: 261 mg/dL — ABNORMAL HIGH (ref 65–99)
Glucose-Capillary: 134 mg/dL — ABNORMAL HIGH (ref 65–99)

## 2016-01-19 MED ORDER — METOCLOPRAMIDE HCL 10 MG PO TABS: 10.0000 mg | ORAL_TABLET | Freq: Three times a day (TID) | ORAL | 2 refills | Status: DC

## 2016-01-19 MED ORDER — ESCITALOPRAM OXALATE 10 MG PO TABS: 5.0000 mg | ORAL_TABLET | Freq: Every day | ORAL | 0 refills | Status: DC

## 2016-01-19 MED ORDER — PANTOPRAZOLE SODIUM 40 MG PO TBEC: 40.0000 mg | DELAYED_RELEASE_TABLET | Freq: Two times a day (BID) | ORAL | 1 refills | Status: DC

## 2016-01-19 MED ORDER — PROMETHAZINE HCL 25 MG PO TABS: 25.0000 mg | ORAL_TABLET | Freq: Three times a day (TID) | ORAL | 0 refills | Status: DC | PRN

## 2016-01-19 MED ORDER — POTASSIUM CHLORIDE CRYS ER 20 MEQ PO TBCR: 20.0000 meq | EXTENDED_RELEASE_TABLET | Freq: Every day | ORAL | 0 refills | Status: DC

## 2016-01-19 MED ORDER — BASAGLAR KWIKPEN 100 UNIT/ML ~~LOC~~ SOPN: 26.0000 [IU] | PEN_INJECTOR | Freq: Every day | SUBCUTANEOUS | 3 refills | Status: DC

## 2016-01-19 NOTE — Progress Notes (Signed)
Completed D/C teaching. Gave prescriptions. Answered questions. Patient will be D/C home in stable condition.

## 2016-01-19 NOTE — Discharge Summary (Signed)
Physician Discharge Summary  [name] [id number] DOB: 14-Feb-1990 DOA: 01/16/2016  PCP: Vicenta Aly, FNP  Admit date: 01/16/2016 Discharge date: 01/19/2016  Time spent: 35 minutes  Recommendations for Outpatient Follow-up:  Repeat BMET to follow electrolytes and renal function  Close follow up to her CBG's and diabetes in general; further adjustment to hypoglycemic regimen to be done as needed. Repeat CBC to follow Hgb trend   Discharge Diagnoses:  Principal Problem:   DKA, type 1 ([place]) Active Problems:   Diabetic gastroparesis ([place])   Type 1 diabetes mellitus with hyperglycemia ([name])   Depression   Discharge Condition: stable and improved. No active episodes of vomiting and well controlled nausea with medications. Patient discharge home with instructions to follow up with PCP in 10 days.  Diet recommendation: modified carbohydrates diet   Filed Weights   01/17/16 0900  Weight: 52.2 kg (115 lb 1.3 oz)    History of present illness:  25 year old female with a history of diabetes mellitus type 1, recent pancreatitis, liver adenomas, anxiety, hepatic steatosis, asthma presented with 1-2 day history of nausea, vomiting. The patient has had numerous admissions in the past 6 months secondary to DKA as well as for N/V from presumptive gastroparesis. During her admissions, it has been characteristic for slow recovery due to slow recovering metabolic acidosis which is initially gapped and subsequently nongapped. In addition, her admissions have been characterized by profound volume depletion requiring significant fluid resuscitation which subsequently resulted in improvement in the patient's sinus tachycardia and metabolic acidosis. Finally, her previous admissions have been characterized by opioid dependence for her "abdominal pain"which has resulted in exacerbation of her presumed gastroparesis resulting in slow recovery of her nausea and vomiting. However, when the  patient's opioids are decreased in dose and frequency, the patient's vomiting improves. In addition when the patient's opioids are decreased, the patient has had a propensity to leave [place] in the past. The patient initially presented to the emergency department on 01/16/2016 secondary to nausea and vomiting. She claimed to have had at least 10 episodes of emesis, and subsequently developed a small amount of hematemesis. Initially, her labs on 01/16/2016 at 2226 showed serum glucose 163 with anion gap of 16.  Six hours later, her serum glucose was 450 with anion gap >23.  Hospital Course:  DKA in DM type 1 -Anion Gap closed, Bicarb 22 and CBG's stable in low to mid 200's range (with good fluctuation around insulin therapy) -will discharge home with lantus and SSI regimen -Lantus dose adjusted for better control   Nausea and vomiting/Abdominal pain -likely gastroparesis related to her DM  -No signs of infection appreciated currently to suggest underlying abnormality -Advise to keep herself well hydrated and will continue metoclopramide -minimize/avoid opioids -continue PPI -continue PRN antiemetics  Hematemesis -Hemoglobin appears to be stable, likely secondary to forceful vomiting, 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 -Patient had same complaint on numerous previous admissions -last 09/23/2014 EGD--diffuse gastritis without clear explanation of the patient's hematemesis -Baseline hemoglobin 11-12, which remained stable -discharge on PPI  DM type I, uncontrolled -Continue fluid resuscitation -A1C 10.1 -patient instructed to follow modified carb diet and discharge on SSI and lantus -will need further adjustment to hypoglycemic regimen as an outpatient  -advised to be compliant with medications    Asthma -No wheezing or stridor -Stable on room air -Continue LABA and continue singulair  Sinus tachycardia/Dehydration -Secondary to stress response from  the patient's volume depletion, vomiting, and pain -resolved with aggressive fluid resuscitation and resolution  of her DKA -Echocardiogram 05/24/2014 showed EF Q000111Q, grade 1 diastolic dysfunction  Dyspnea -Likely secondary to metabolic acidosis -Chest x-ray w/o signs of acute infection  -she denies SOB at time of discharge once DKA resolved  Pyuria -Not started on antibiotics presently as pt was afebrile, hemodynamically stable and has normal WBC and lactate -patient also denies dysuria   Chronic pain syndrome -treated with PO pain meds -advise to use tylenol as needed at home -also educated about pain with gastroparesis and how narcotics make condition worse   Procedures:  See below for x-ray reports   Consultations:  None   Discharge Exam: Vitals:   01/19/16 0145 01/19/16 0626  BP: (!) 140/92 (!) 137/96  Pulse: (!) 109 (!) 105  Resp: 20 16  Temp: 99.3 F (37.4 C) 99.3 F (37.4 C)     General:  Pt is alert, follows commands appropriately, not in acute distress. Reports some nausea, but no vomiting and abd pain is well controlled and not an acute issue currently. Anion Gap closed, bicarb 22 and DKA resolved for now.  HEENT: No icterus, No thrush, No neck mass, [place]/AT  Cardiovascular: RRR, S1/S2, no rubs, no gallops  Respiratory: CTA bilaterally, no wheezing, no crackles, no rhonchi  Abdomen: Soft/+BS, non tender, non distended, no guarding  Extremities: No edema, No lymphangitis, No petechiae, No rashes, no synovitis   Discharge Instructions   Discharge Instructions    Diet Carb Modified    Complete by:  As directed    Discharge instructions    Complete by:  As directed    Keep yourself well hydrated Take medications as prescribed Please do not skip meals and do not missed insulin treatment  Arrange follow up with PCP in 10 days  Be compliant with your medications   Increase activity slowly    Complete by:  As directed      Current Discharge  Medication List    START taking these medications   Details  pantoprazole (PROTONIX) 40 MG tablet Take 1 tablet (40 mg total) by mouth 2 (two) times daily. Qty: 60 tablet, Refills: 1      CONTINUE these medications which have CHANGED   Details  escitalopram (LEXAPRO) 10 MG tablet Take 0.5 tablets (5 mg total) by mouth daily. Qty: 30 tablet, Refills: 0    Insulin Glargine (BASAGLAR KWIKPEN) 100 UNIT/ML SOPN Inject 0.26 mLs (26 Units total) into the skin at bedtime. Qty: 1 pen, Refills: 3    metoCLOPramide (REGLAN) 10 MG tablet Take 1 tablet (10 mg total) by mouth 3 (three) times daily before meals. Qty: 90 tablet, Refills: 2    potassium chloride SA (K-DUR,KLOR-CON) 20 MEQ tablet Take 1 tablet (20 mEq total) by mouth daily. Qty: 5 tablet, Refills: 0    promethazine (PHENERGAN) 25 MG tablet Take 1 tablet (25 mg total) by mouth every 8 (eight) hours as needed for nausea or vomiting. Qty: 30 tablet, Refills: 0      CONTINUE these medications which have NOT CHANGED   Details  budesonide-formoterol (SYMBICORT) 160-4.5 MCG/ACT inhaler Inhale 2 puffs into the lungs daily.     montelukast (SINGULAIR) 10 MG tablet Take 10 mg by mouth daily.    NOVOLOG FLEXPEN 100 UNIT/ML FlexPen 1 UNIT FOR EVERY 10 GRAMS CARB W/MEALS/SNACKS PLUS SLIDING SCALE: 1 UNIT GLUCOSE 50 ABOVE 150.TDD 20 Refills: 5    albuterol (PROVENTIL HFA;VENTOLIN HFA) 108 (90 BASE) MCG/ACT inhaler Inhale 2 puffs into the lungs every 6 (six) hours as needed for  shortness of breath.    albuterol (PROVENTIL) (2.5 MG/3ML) 0.083% nebulizer solution Take 2.5 mg by nebulization every 6 (six) hours as needed for shortness of breath.    Vitamin D, Ergocalciferol, (DRISDOL) 50000 units CAPS capsule TAKE ONE CAPSULE (50,000 UNITS TOTAL) BY MOUTH ONCE A WEEK. Saturday Refills: 0       Allergies  Allergen Reactions  . Peanut-Containing Drug Products Swelling    Swelling of mouth, lips  . Strawberry Extract Swelling    Swelling  of mouth, lips  . Ultram [Tramadol] Itching   Follow-up Information    ANDERSON,TERESA, FNP. Schedule an appointment as soon as possible for a visit in 10 day(s).   Specialty:  Nurse Practitioner Contact information: [address] [place] [address] [phone]            The results of significant diagnostics from this hospitalization (including imaging, microbiology, ancillary and laboratory) are listed below for reference.    Significant Diagnostic Studies: Dg Abd Acute W/chest  Result Date: 01/17/2016 CLINICAL DATA:  Intractable vomiting EXAM: DG ABDOMEN ACUTE W/ 1V CHEST COMPARISON:  Abdominal radiograph November 20, 2015 FINDINGS: PA chest: No edema or consolidation. The heart size and pulmonary vascularity are normal. No adenopathy. Supine and left lateral decubitus abdomen: There is no bowel dilatation or air-fluid level suggesting bowel obstruction. No free air. There is soft tissue fullness in the pelvis, possibly a distended urinary bladder. There are surgical clips in the right upper quadrant medially. There is a small phlebolith in the lower left pelvis. IMPRESSION: No bowel obstruction or free air. Soft tissue fullness in the pelvis. Question distended urinary bladder. If this structure does not represent a distended urinary bladder, pelvic mass must be of concern. Lungs clear. Electronically Signed   By: Lowella Grip III M.D.   On: 01/17/2016 10:43    Microbiology: Recent Results (from the past 240 hour(s))  Culture, Urine     Status: Abnormal   Collection Time: 01/17/16  7:03 AM  Result Value Ref Range Status   Specimen Description URINE, CLEAN CATCH  Final   Special Requests NONE  Final   Culture MULTIPLE SPECIES PRESENT, SUGGEST RECOLLECTION (A)  Final   Report Status 01/18/2016 FINAL  Final  MRSA PCR Screening     Status: None   Collection Time: 01/17/16  9:02 AM  Result Value Ref Range Status   MRSA by PCR NEGATIVE NEGATIVE Final     Comment:        The GeneXpert MRSA Assay (FDA approved for NASAL specimens only), is one component of a comprehensive MRSA colonization surveillance program. It is not intended to diagnose MRSA infection nor to guide or monitor treatment for MRSA infections.      Labs: Basic Metabolic Panel:  Recent Labs Lab 01/17/16 1820 01/17/16 2208 01/18/16 0201 01/18/16 0630 01/19/16 0745  NA 134* 137 138 137 138  K 4.0 3.7 3.5 3.4* 3.2*  CL 108 109 109 108 104  CO2 15* 17* 19* 19* 22  GLUCOSE 234* 177* 141* 174* 280*  BUN 13 12 11 10 7   CREATININE 0.60 0.48 0.36* 0.50 0.35*  CALCIUM 8.5* 8.8* 8.9 9.0 8.3*   Liver Function Tests:  Recent Labs Lab 01/16/16 2226  AST 31  ALT 16  ALKPHOS 108  BILITOT 1.2  PROT 8.7*  ALBUMIN 4.7    Recent Labs Lab 01/16/16 2226  LIPASE 11   No results for input(s): AMMONIA in the last 168 hours. CBC:  Recent Labs Lab 01/16/16 2226 01/17/16 0609 01/18/16 0201  WBC 9.3  --  9.4  HGB 14.4 15.6* 12.1  HCT 42.3 46.0 36.2  MCV 86.0  --  88.3  PLT 472*  --  398   Cardiac Enzymes: No results for input(s): CKTOTAL, CKMB, CKMBINDEX, TROPONINI in the last 168 hours. BNP: BNP (last 3 results)  Recent Labs  04/11/15 0325 07/11/15 0353  BNP 43.5 6.9    ProBNP (last 3 results) No results for input(s): PROBNP in the last 8760 hours.  CBG:  Recent Labs Lab 01/18/16 1202 01/18/16 1623 01/18/16 2216 01/19/16 0810 01/19/16 1135  GLUCAP 121* 166* 257* 261* 134*       Signed:  Barton Dubois MD.  [facility] 01/19/2016, 3:55 PM

## 2016-01-22 ENCOUNTER — Inpatient Hospital Stay (HOSPITAL_COMMUNITY)
Admission: EM | Admit: 2016-01-22 | Discharge: 2016-01-25 | DRG: 637 | Disposition: A | Discharge status: Home / Self Care | Payer: Medicaid Other | Attending: Internal Medicine | Admitting: Internal Medicine

## 2016-01-22 ENCOUNTER — Encounter (HOSPITAL_COMMUNITY): Payer: Self-pay | Admitting: *Deleted

## 2016-01-22 DIAGNOSIS — N390 Urinary tract infection, site not specified: Secondary | ICD-10-CM

## 2016-01-22 DIAGNOSIS — Z794 Long term (current) use of insulin: Secondary | ICD-10-CM

## 2016-01-22 DIAGNOSIS — E1010 Type 1 diabetes mellitus with ketoacidosis without coma: Secondary | ICD-10-CM | POA: Diagnosis not present

## 2016-01-22 DIAGNOSIS — R339 Retention of urine, unspecified: Secondary | ICD-10-CM | POA: Diagnosis present

## 2016-01-22 DIAGNOSIS — R000 Tachycardia, unspecified: Secondary | ICD-10-CM | POA: Diagnosis present

## 2016-01-22 DIAGNOSIS — Z91018 Allergy to other foods: Secondary | ICD-10-CM

## 2016-01-22 DIAGNOSIS — R319 Hematuria, unspecified: Secondary | ICD-10-CM

## 2016-01-22 DIAGNOSIS — Z885 Allergy status to narcotic agent status: Secondary | ICD-10-CM

## 2016-01-22 DIAGNOSIS — E860 Dehydration: Secondary | ICD-10-CM | POA: Diagnosis present

## 2016-01-22 DIAGNOSIS — E1065 Type 1 diabetes mellitus with hyperglycemia: Secondary | ICD-10-CM

## 2016-01-22 DIAGNOSIS — B3741 Candidal cystitis and urethritis: Secondary | ICD-10-CM | POA: Diagnosis present

## 2016-01-22 DIAGNOSIS — R338 Other retention of urine: Secondary | ICD-10-CM | POA: Diagnosis not present

## 2016-01-22 DIAGNOSIS — Z8249 Family history of ischemic heart disease and other diseases of the circulatory system: Secondary | ICD-10-CM

## 2016-01-22 DIAGNOSIS — Z7951 Long term (current) use of inhaled steroids: Secondary | ICD-10-CM

## 2016-01-22 DIAGNOSIS — K5900 Constipation, unspecified: Secondary | ICD-10-CM | POA: Diagnosis present

## 2016-01-22 DIAGNOSIS — G8929 Other chronic pain: Secondary | ICD-10-CM | POA: Diagnosis present

## 2016-01-22 DIAGNOSIS — K3184 Gastroparesis: Secondary | ICD-10-CM | POA: Diagnosis present

## 2016-01-22 DIAGNOSIS — K226 Gastro-esophageal laceration-hemorrhage syndrome: Secondary | ICD-10-CM | POA: Diagnosis present

## 2016-01-22 DIAGNOSIS — J45909 Unspecified asthma, uncomplicated: Secondary | ICD-10-CM | POA: Diagnosis present

## 2016-01-22 DIAGNOSIS — E876 Hypokalemia: Secondary | ICD-10-CM | POA: Diagnosis present

## 2016-01-22 DIAGNOSIS — F419 Anxiety disorder, unspecified: Secondary | ICD-10-CM | POA: Diagnosis present

## 2016-01-22 DIAGNOSIS — Z91010 Allergy to peanuts: Secondary | ICD-10-CM

## 2016-01-22 DIAGNOSIS — E1043 Type 1 diabetes mellitus with diabetic autonomic (poly)neuropathy: Secondary | ICD-10-CM | POA: Diagnosis present

## 2016-01-22 DIAGNOSIS — Z79899 Other long term (current) drug therapy: Secondary | ICD-10-CM

## 2016-01-22 DIAGNOSIS — Z833 Family history of diabetes mellitus: Secondary | ICD-10-CM

## 2016-01-22 DIAGNOSIS — I10 Essential (primary) hypertension: Secondary | ICD-10-CM | POA: Diagnosis present

## 2016-01-22 DIAGNOSIS — Z765 Malingerer [conscious simulation]: Secondary | ICD-10-CM

## 2016-01-22 DIAGNOSIS — K760 Fatty (change of) liver, not elsewhere classified: Secondary | ICD-10-CM | POA: Diagnosis present

## 2016-01-22 LAB — GLUCOSE, CAPILLARY
GLUCOSE-CAPILLARY: 134 mg/dL — AB (ref 65–99)
GLUCOSE-CAPILLARY: 181 mg/dL — AB (ref 65–99)
Glucose-Capillary: 124 mg/dL — ABNORMAL HIGH (ref 65–99)
Glucose-Capillary: 145 mg/dL — ABNORMAL HIGH (ref 65–99)
Glucose-Capillary: 148 mg/dL — ABNORMAL HIGH (ref 65–99)
Glucose-Capillary: 153 mg/dL — ABNORMAL HIGH (ref 65–99)
Glucose-Capillary: 154 mg/dL — ABNORMAL HIGH (ref 65–99)
Glucose-Capillary: 160 mg/dL — ABNORMAL HIGH (ref 65–99)
Glucose-Capillary: 162 mg/dL — ABNORMAL HIGH (ref 65–99)
Glucose-Capillary: 202 mg/dL — ABNORMAL HIGH (ref 65–99)
Glucose-Capillary: 220 mg/dL — ABNORMAL HIGH (ref 65–99)
Glucose-Capillary: 230 mg/dL — ABNORMAL HIGH (ref 65–99)
Glucose-Capillary: 254 mg/dL — ABNORMAL HIGH (ref 65–99)

## 2016-01-22 LAB — URINALYSIS, ROUTINE W REFLEX MICROSCOPIC
Bilirubin Urine: NEGATIVE
Glucose, UA: >=500 mg/dL — AB
Ketones, ur: 80 mg/dL — AB
NITRITE: NEGATIVE
PH: 6 (ref 5.0–8.0)
Protein, ur: NEGATIVE mg/dL
SPECIFIC GRAVITY, URINE: 1.011 (ref 1.005–1.030)
Squamous Epithelial / LPF: NONE SEEN

## 2016-01-22 LAB — BASIC METABOLIC PANEL
ANION GAP: 16 — AB (ref 5–15)
ANION GAP: 16 — AB (ref 5–15)
ANION GAP: 11 (ref 5–15)
ANION GAP: 13 (ref 5–15)
ANION GAP: 12 (ref 5–15)
BUN: 20 mg/dL (ref 6–20)
BUN: 16 mg/dL (ref 6–20)
BUN: 13 mg/dL (ref 6–20)
BUN: 11 mg/dL (ref 6–20)
BUN: 9 mg/dL (ref 6–20)
CALCIUM: 7.7 mg/dL — AB (ref 8.9–10.3)
CALCIUM: 8.3 mg/dL — AB (ref 8.9–10.3)
CALCIUM: 8.3 mg/dL — AB (ref 8.9–10.3)
CHLORIDE: 103 mmol/L (ref 101–111)
CO2: 16 mmol/L — ABNORMAL LOW (ref 22–32)
CO2: 10 mmol/L — ABNORMAL LOW (ref 22–32)
CO2: 15 mmol/L — ABNORMAL LOW (ref 22–32)
CO2: 13 mmol/L — ABNORMAL LOW (ref 22–32)
CO2: 16 mmol/L — ABNORMAL LOW (ref 22–32)
CREATININE: 0.75 mg/dL (ref 0.44–1.00)
CREATININE: 0.54 mg/dL (ref 0.44–1.00)
Calcium: 8.6 mg/dL — ABNORMAL LOW (ref 8.9–10.3)
Calcium: 7.7 mg/dL — ABNORMAL LOW (ref 8.9–10.3)
Chloride: 112 mmol/L — ABNORMAL HIGH (ref 101–111)
Chloride: 111 mmol/L (ref 101–111)
Chloride: 109 mmol/L (ref 101–111)
Chloride: 108 mmol/L (ref 101–111)
Creatinine, Ser: 0.69 mg/dL (ref 0.44–1.00)
Creatinine, Ser: 0.54 mg/dL (ref 0.44–1.00)
Creatinine, Ser: 0.54 mg/dL (ref 0.44–1.00)
GFR calc Af Amer: >60 mL/min (ref >60)
GFR calc non Af Amer: >60 mL/min (ref >60)
GLUCOSE: 168 mg/dL — AB (ref 65–99)
GLUCOSE: 175 mg/dL — AB (ref 65–99)
Glucose, Bld: 141 mg/dL — ABNORMAL HIGH (ref 65–99)
Glucose, Bld: 264 mg/dL — ABNORMAL HIGH (ref 65–99)
Glucose, Bld: 206 mg/dL — ABNORMAL HIGH (ref 65–99)
POTASSIUM: 2.8 mmol/L — AB (ref 3.5–5.1)
Potassium: 4.7 mmol/L (ref 3.5–5.1)
Potassium: 3.6 mmol/L (ref 3.5–5.1)
Potassium: 3.9 mmol/L (ref 3.5–5.1)
Potassium: 3.1 mmol/L — ABNORMAL LOW (ref 3.5–5.1)
SODIUM: 135 mmol/L (ref 135–145)
SODIUM: 138 mmol/L (ref 135–145)
Sodium: 137 mmol/L (ref 135–145)
Sodium: 135 mmol/L (ref 135–145)
Sodium: 136 mmol/L (ref 135–145)

## 2016-01-22 LAB — MAGNESIUM: MAGNESIUM: 1.6 mg/dL — AB (ref 1.7–2.4)

## 2016-01-22 LAB — CBC WITH DIFFERENTIAL/PLATELET
BASOS ABS: 0 10*3/uL (ref 0.0–0.1)
BASOS PCT: 0 %
Eosinophils Absolute: 0.3 10*3/uL (ref 0.0–0.7)
Eosinophils Relative: 4 %
HEMATOCRIT: 36.9 % (ref 36.0–46.0)
Hemoglobin: 12.7 g/dL (ref 12.0–15.0)
Lymphocytes Relative: 33 %
Lymphs Abs: 2.4 10*3/uL (ref 0.7–4.0)
MCH: 29.1 pg (ref 26.0–34.0)
MCHC: 34.4 g/dL (ref 30.0–36.0)
MCV: 84.6 fL (ref 78.0–100.0)
MONO ABS: 0.7 10*3/uL (ref 0.1–1.0)
Monocytes Relative: 10 %
Neutro Abs: 3.9 10*3/uL (ref 1.7–7.7)
Neutrophils Relative %: 53 %
PLATELETS: 428 10*3/uL — AB (ref 150–400)
RBC: 4.36 MIL/uL (ref 3.87–5.11)
RDW: 12.9 % (ref 11.5–15.5)
WBC: 7.3 10*3/uL (ref 4.0–10.5)

## 2016-01-22 LAB — BLOOD GAS, VENOUS
Acid-base deficit: 6.7 mmol/L — ABNORMAL HIGH (ref 0.0–2.0)
Bicarbonate: 17.3 mmol/L — ABNORMAL LOW (ref 20.0–28.0)
FIO2: 21
O2 SAT: 82.4 %
PCO2 VEN: 31.9 mmHg — AB (ref 44.0–60.0)
PH VEN: 7.354 (ref 7.250–7.430)
Patient temperature: 98.6
pO2, Ven: 50 mmHg — ABNORMAL HIGH (ref 32.0–45.0)

## 2016-01-22 LAB — I-STAT CHEM 8, ED
BUN: 21 mg/dL — ABNORMAL HIGH (ref 6–20)
CHLORIDE: 105 mmol/L (ref 101–111)
Calcium, Ion: 1.1 mmol/L — ABNORMAL LOW (ref 1.15–1.40)
Creatinine, Ser: 0.7 mg/dL (ref 0.44–1.00)
GLUCOSE: 177 mg/dL — AB (ref 65–99)
HCT: 45 % (ref 36.0–46.0)
HEMOGLOBIN: 15.3 g/dL — AB (ref 12.0–15.0)
Potassium: 3 mmol/L — ABNORMAL LOW (ref 3.5–5.1)
SODIUM: 135 mmol/L (ref 135–145)
TCO2: 14 mmol/L (ref 0–100)

## 2016-01-22 LAB — I-STAT CG4 LACTIC ACID, ED
LACTIC ACID, VENOUS: 0.72 mmol/L (ref 0.5–1.9)
Lactic Acid, Venous: 0.94 mmol/L (ref 0.5–1.9)

## 2016-01-22 LAB — I-STAT BETA HCG BLOOD, ED (MC, WL, AP ONLY)

## 2016-01-22 LAB — CBG MONITORING, ED
GLUCOSE-CAPILLARY: 124 mg/dL — AB (ref 65–99)
Glucose-Capillary: 263 mg/dL — ABNORMAL HIGH (ref 65–99)

## 2016-01-22 MED ORDER — SODIUM CHLORIDE 0.9 % IV SOLN: INTRAVENOUS | Status: DC

## 2016-01-22 MED ORDER — METOCLOPRAMIDE HCL 5 MG/ML IJ SOLN
10.0000 mg | Freq: Once | INTRAMUSCULAR | Status: AC
  Administered 2016-01-22: 10 mg via INTRAVENOUS
  Filled 2016-01-22: qty 2

## 2016-01-22 MED ORDER — ENOXAPARIN SODIUM 40 MG/0.4ML ~~LOC~~ SOLN
40.0000 mg | SUBCUTANEOUS | Status: DC
  Administered 2016-01-22: 40 mg via SUBCUTANEOUS
  Filled 2016-01-22 (×2): qty 0.4

## 2016-01-22 MED ORDER — POTASSIUM CHLORIDE 10 MEQ/100ML IV SOLN
10.0000 meq | INTRAVENOUS | Status: AC
  Administered 2016-01-22 (×3): 10 meq via INTRAVENOUS
  Filled 2016-01-22 (×3): qty 100

## 2016-01-22 MED ORDER — PROMETHAZINE HCL 25 MG/ML IJ SOLN
12.5000 mg | Freq: Four times a day (QID) | INTRAMUSCULAR | Status: DC | PRN
  Administered 2016-01-22 – 2016-01-23 (×4): 12.5 mg via INTRAVENOUS
  Filled 2016-01-22 (×4): qty 1

## 2016-01-22 MED ORDER — SODIUM CHLORIDE 0.9 % IV SOLN
INTRAVENOUS | Status: DC
  Administered 2016-01-22: 12:00:00 via INTRAVENOUS

## 2016-01-22 MED ORDER — DEXTROSE 5 % IV SOLN
1.0000 g | INTRAVENOUS | Status: DC
  Administered 2016-01-23 – 2016-01-24 (×2): 1 g via INTRAVENOUS
  Filled 2016-01-22 (×2): qty 10

## 2016-01-22 MED ORDER — SODIUM CHLORIDE 0.9 % IV BOLUS (SEPSIS): 1000.0000 mL | Freq: Once | INTRAVENOUS | Status: DC

## 2016-01-22 MED ORDER — SODIUM CHLORIDE 0.9 % IV BOLUS (SEPSIS)
2000.0000 mL | Freq: Once | INTRAVENOUS | Status: AC
  Administered 2016-01-22: 2000 mL via INTRAVENOUS

## 2016-01-22 MED ORDER — DEXTROSE 5 % IV SOLN
1.0000 g | Freq: Once | INTRAVENOUS | Status: AC
  Administered 2016-01-22: 1 g via INTRAVENOUS
  Filled 2016-01-22: qty 10

## 2016-01-22 MED ORDER — HYDRALAZINE HCL 20 MG/ML IJ SOLN: 5.0000 mg | Freq: Four times a day (QID) | INTRAMUSCULAR | Status: DC | PRN

## 2016-01-22 MED ORDER — TAMSULOSIN HCL 0.4 MG PO CAPS
0.4000 mg | ORAL_CAPSULE | Freq: Every day | ORAL | Status: DC
  Administered 2016-01-22 – 2016-01-25 (×3): 0.4 mg via ORAL
  Filled 2016-01-22 (×3): qty 1

## 2016-01-22 MED ORDER — ALBUTEROL SULFATE (2.5 MG/3ML) 0.083% IN NEBU: 2.5000 mg | INHALATION_SOLUTION | Freq: Four times a day (QID) | RESPIRATORY_TRACT | Status: DC | PRN

## 2016-01-22 MED ORDER — FENTANYL CITRATE (PF) 100 MCG/2ML IJ SOLN
50.0000 ug | Freq: Once | INTRAMUSCULAR | Status: AC
  Administered 2016-01-22: 50 ug via INTRAVENOUS
  Filled 2016-01-22: qty 2

## 2016-01-22 MED ORDER — SODIUM CHLORIDE 0.9 % IV SOLN
INTRAVENOUS | Status: DC
  Filled 2016-01-22: qty 2.5

## 2016-01-22 MED ORDER — INSULIN REGULAR HUMAN 100 UNIT/ML IJ SOLN
INTRAMUSCULAR | Status: DC
  Administered 2016-01-22: 1.9 [IU]/h via INTRAVENOUS
  Filled 2016-01-22 (×2): qty 2.5

## 2016-01-22 MED ORDER — PANTOPRAZOLE SODIUM 40 MG PO TBEC
40.0000 mg | DELAYED_RELEASE_TABLET | Freq: Two times a day (BID) | ORAL | Status: DC
  Administered 2016-01-22 – 2016-01-23 (×2): 40 mg via ORAL
  Filled 2016-01-22 (×2): qty 1

## 2016-01-22 MED ORDER — PROMETHAZINE HCL 25 MG PO TABS
12.5000 mg | ORAL_TABLET | Freq: Four times a day (QID) | ORAL | Status: DC | PRN
  Administered 2016-01-22: 12.5 mg via ORAL
  Filled 2016-01-22 (×2): qty 1

## 2016-01-22 MED ORDER — DEXTROSE-NACL 5-0.45 % IV SOLN
INTRAVENOUS | Status: DC
  Administered 2016-01-22 – 2016-01-23 (×2): via INTRAVENOUS

## 2016-01-22 MED ORDER — ALBUTEROL SULFATE HFA 108 (90 BASE) MCG/ACT IN AERS: 2.0000 | INHALATION_SPRAY | Freq: Four times a day (QID) | RESPIRATORY_TRACT | Status: DC | PRN

## 2016-01-22 MED ORDER — ESCITALOPRAM OXALATE 10 MG PO TABS
5.0000 mg | ORAL_TABLET | Freq: Every day | ORAL | Status: DC
  Administered 2016-01-22 – 2016-01-25 (×3): 5 mg via ORAL
  Filled 2016-01-22 (×6): qty 1

## 2016-01-22 MED ORDER — SODIUM CHLORIDE 0.9 % IV BOLUS (SEPSIS)
1000.0000 mL | Freq: Once | INTRAVENOUS | Status: AC
  Administered 2016-01-22: 1000 mL via INTRAVENOUS

## 2016-01-22 MED ORDER — MOMETASONE FURO-FORMOTEROL FUM 200-5 MCG/ACT IN AERO
2.0000 | INHALATION_SPRAY | Freq: Two times a day (BID) | RESPIRATORY_TRACT | Status: DC
  Administered 2016-01-22: 2 via RESPIRATORY_TRACT
  Filled 2016-01-22 (×2): qty 8.8

## 2016-01-22 MED ORDER — MORPHINE SULFATE (PF) 2 MG/ML IV SOLN
1.0000 mg | INTRAVENOUS | Status: DC | PRN
  Administered 2016-01-22: 1 mg via INTRAVENOUS
  Filled 2016-01-22: qty 1

## 2016-01-22 MED ORDER — POTASSIUM CHLORIDE CRYS ER 20 MEQ PO TBCR
20.0000 meq | EXTENDED_RELEASE_TABLET | Freq: Every day | ORAL | Status: DC
  Administered 2016-01-22 – 2016-01-25 (×3): 20 meq via ORAL
  Filled 2016-01-22 (×3): qty 1

## 2016-01-22 MED ORDER — DEXTROSE-NACL 5-0.45 % IV SOLN: INTRAVENOUS | Status: DC

## 2016-01-22 MED ORDER — KCL IN DEXTROSE-NACL 40-5-0.9 MEQ/L-%-% IV SOLN
INTRAVENOUS | Status: DC
  Filled 2016-01-22: qty 1000

## 2016-01-22 MED ORDER — HYDROCODONE-ACETAMINOPHEN 5-325 MG PO TABS
1.0000 | ORAL_TABLET | ORAL | Status: DC | PRN
  Administered 2016-01-22 – 2016-01-23 (×4): 1 via ORAL
  Filled 2016-01-22 (×3): qty 1

## 2016-01-22 MED ORDER — MONTELUKAST SODIUM 10 MG PO TABS
10.0000 mg | ORAL_TABLET | Freq: Every day | ORAL | Status: DC
  Administered 2016-01-22 – 2016-01-25 (×3): 10 mg via ORAL
  Filled 2016-01-22 (×3): qty 1

## 2016-01-22 MED ORDER — POTASSIUM CHLORIDE 2 MEQ/ML IV SOLN: 30.0000 meq | Freq: Once | INTRAVENOUS | Status: DC

## 2016-01-22 MED ORDER — METOCLOPRAMIDE HCL 10 MG PO TABS
10.0000 mg | ORAL_TABLET | Freq: Three times a day (TID) | ORAL | Status: DC
  Administered 2016-01-22 – 2016-01-25 (×9): 10 mg via ORAL
  Filled 2016-01-22 (×9): qty 1

## 2016-01-22 MED ORDER — POTASSIUM CHLORIDE CRYS ER 20 MEQ PO TBCR
40.0000 meq | EXTENDED_RELEASE_TABLET | Freq: Once | ORAL | Status: AC
Start: 2016-01-22 — End: 2016-01-22
  Administered 2016-01-22: 40 meq via ORAL
  Filled 2016-01-22: qty 2

## 2016-01-22 MED ORDER — SODIUM CHLORIDE 0.9 % IV SOLN
INTRAVENOUS | Status: AC
  Administered 2016-01-22: 11:00:00 via INTRAVENOUS

## 2016-01-22 MED ORDER — HYDROCODONE-ACETAMINOPHEN 5-325 MG PO TABS
1.0000 | ORAL_TABLET | Freq: Once | ORAL | Status: DC
  Filled 2016-01-22: qty 1

## 2016-01-22 MED ORDER — MAGNESIUM SULFATE 2 GM/50ML IV SOLN
2.0000 g | Freq: Once | INTRAVENOUS | Status: AC
  Administered 2016-01-22: 2 g via INTRAVENOUS
  Filled 2016-01-22: qty 50

## 2016-01-22 MED ORDER — VITAMIN D (ERGOCALCIFEROL) 1.25 MG (50000 UNIT) PO CAPS
50000.0000 [IU] | ORAL_CAPSULE | ORAL | Status: DC
Start: 2016-01-22 — End: 2016-01-25
  Administered 2016-01-22: 50000 [IU] via ORAL
  Filled 2016-01-22: qty 1

## 2016-01-22 NOTE — ED Provider Notes (Signed)
PROGRESS NOTE                                                                                                                 This is a sign-out from [place] at shift change: [name] is a 26 y.o. female presenting with acute urinary retention onset last night at approximately midnight with associated lower abdominal pain and swelling. She denies dysuria, hematuria, fever, chills, nausea, vomiting, flank pain. Plan is to admit for DKA. Please refer to previous note for full HPI, ROS, PMH and PE.   Patient has an elevated anion gap of 17, bicarbonate is low at 16, potassium low at 2.8 however blood glucose is relatively normal at 141. Will hold glucose stabilizer however this could be a euglycemic DKA. She does have 80 ketones in her urine. She has a normal pH on VBG however it again demonstrates a low bicarbonate. Urinalysis is consistent with infection, she has received Rocephin, urine culture pending.  Patient seen and examined at the bedside, exterior genitalia exam chaperoned by PA student: No rashes or lesions, Foley catheter is in place. Abdominal exam benign with no tenderness to deep palpation of any quadrant, no CVA tenderness to percussion bilaterally. Lumbar spinous tenderness to percussion, no saddle anesthesia. +shaking rigors (normal lactic acid no leukocytosis, will check a rectal temp.)  Given her urinary retention, hypokalemia, low bicarbonate and will need admission. It think the urinary retention is likely secondary to UTI. I doubt this is a cauda equina, she has no signs of herpes.  Discussed with [facility] hospital[facility] Dr. Carles Collet who thinks this is likely DKA and agrees with starting glucose stabilizer, and informed him her potassium is 2.8 and we have repleted orally with 40 mEq and also IV, confirms that it is okay to start the glucose stabilizer without potassium. He will evaluate the patient and put him holding orders.   Stefanie Ricks Danice Dippolito, PA-C 01/22/16 [id number]    Sherwood Gambler, MD 01/22/16 2256

## 2016-01-22 NOTE — ED Triage Notes (Signed)
Patient is alert and oriented x4.  She is complaining of urinary retention with abdominal swelling that started last night.  Patient was recently seen for DKA in this ED.  Currently she rates her pain 10 of 10.

## 2016-01-22 NOTE — Progress Notes (Signed)
Pt's BP at 1730 was 159/101, retook it and got 161/105 (122). MD made aware.

## 2016-01-22 NOTE — H&P (Signed)
History and Physical  [name] [id number] DOB: 31-Jul-1990 DOA: 01/22/2016   PCP: Vicenta Aly, FNP   Patient coming from: Home  Chief Complaint: abdominal distension  HPI:  [name] is a 26 y.o. female with medical history of diabetes mellitus type 1, recent pancreatitis, liver adenomas, anxiety, hepatic steatosis, asthma presented with 1 day history of abdominal distention and abdominal pain. Initial workup in emergency department revealed urinary retention and a Foley catheter was placed with urine output of at least 600 mL. The patient continues to complain of her chronic abdominal pain without any emesis but complains of some nausea. She denied any fevers, chills, chest pain, short of breath, headache, neck pain, dysuria, hematuria. Notably, the patient was recently discharged from the hospital on 01/19/2016 after treatment for DKA.   In the emergency department, the patient was noted to have unremarkable CBC, but BMP showed an anion gap of 16 even though her serum glucose was only 141. I requested that the patient is started on insulin drip and placed on the DKA protocol. Urinalysis showed  TNTC WBC. The patient was admitted for treatment of urinary retention, UTI, and DKA.  The patient has had numerous admissions in the past 6 months secondary to DKA as well as for N/V from presumptive gastroparesis. During her admissions, it has been characteristic for slow recovery due to slow recovering metabolic acidosis which is initially gapped and subsequently nongapped. In addition, her admissions have been characterized by profound volume depletion requiring significant fluid resuscitation which subsequently resulted in improvement in the patient's sinus tachycardia and metabolic acidosis. Finally, her previous admissions have been characterized by opioid dependence for her "abdominal pain" which has resulted in exacerbation of her presumed gastroparesis resulting in slow  recovery of her nausea and vomiting. However, when the patient's opioids are decreased in dose and frequency, the patient's vomiting improves. In addition when the patient's opioids are decreased, the patient has had a propensity to leave [place] in the past.  Assessment/Plan: DKA in DM type 1 -patient started on IV insulin with q 1 hour CBG check and q 4 hour BMPs -pt started on aggressive fluid resuscitation -Electrolytes were monitored and repleted -transitioned to [facility] insulin once anion gap is closed -she normally takes insulin glargine 20 units q hs, which she claims compliance -11/10/2015 HbA1C--10.3  UTI/Urine Retention -foley placed in ED -voiding trial prior to d/c -she has had urine retention problems in past due to opioids -empiric ceftriaxone pending urine culture -01/22/16 UA--TNTC WBC   Chronic Abdominal pain -likely gastroparesis related to her DM  -do Not feel this is related to her UTI/pyelonephritis as she is afebrile without leukocytosis -abd pain very similar character to numerous prior admits -11/02/2015 CT abdomen pelvis--stable hepatic masses, normal pancreas, normal bowel wall thickening or inflammation -04/21/2015 CT abdomen and pelvis--stable hepatic masses, normal pancreas, normal bowel wall thickening or inflammation, normal spleen and pancreas -09/23/2014 EGD--diffuse gastritis without clear explanation of the patient's hematemesis -Continue IV fluid, metoclopramide -minimize opioids  -she has narcotic seeking behavior -scheduled zofran q 6hrs -continue PPI  Hematemesis -resolved since her recent admission -Hemoglobin appears to be stable, likely secondary to forceful vomiting, 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 -Patient had same complaint on numerous previous admissions -09/23/2014 EGD--diffuse gastritis without clear explanation of the patient's hematemesis -Baseline hemoglobin 11-12 -serial CBC  DM type I,  uncontrolled -Continue fluid resuscitation -01/18/2016 hemoglobin A1c 10.1  -she is suppose to be on Basaglar 26 units at  bedtime  Asthma -No wheezing or stridor -Stable on room air -Continue LABA -continue singulair  Sinus tachycardia/Dehydration -Secondary to stress response from the patient's volume depletion, vomiting, and pain -As discussed, aggressive fluid resuscitation and conservative measures usually results in an improvement -Echocardiogram 05/24/2014 showed EF [id number], grade 1 diastolic dysfunction -[id number]  Hypokalemia/Hypomagnesemia -replete        Past Medical History:  Diagnosis Date  . Anxiety   . Arthritis   . Asthma   . Diabetes mellitus 2007   IDDM.  poorly controlled, multiple admits with DKA  . Gallstones   . Heart murmur   . Hepatic steatosis 11/26/2014   and hepatomegaly  . Hypertension    NOT CURRENTLY ON ANY BP MED  . Liver mass 11/26/2014  . Pancreatitis, acute 11/26/2014   Past Surgical History:  Procedure Laterality Date  . CHOLECYSTECTOMY N/A 02/11/2015   Procedure: LAPAROSCOPIC CHOLECYSTECTOMY WITH INTRAOPERATIVE CHOLANGIOGRAM;  Surgeon: Greer Pickerel, MD;  Location: [facility] ORS;  Service: General;  Laterality: N/A;  . ESOPHAGOGASTRODUODENOSCOPY (EGD) WITH PROPOFOL Left 09/20/2014   Procedure: ESOPHAGOGASTRODUODENOSCOPY (EGD) WITH PROPOFOL;  Surgeon: Arta Silence, MD;  Location[facility] END[facility]COPY;  Service: Endoscopy;  Laterality: Left;  . WISDOM TOOTH EXTRACTION     Social History:  reports that she has never smoked. She has never used smokeless tobacco. She reports that she uses drugs, including Marijuana. She reports that she does not drink alcohol.   Family History  Problem Relation Age of Onset  . Heart disease Maternal Grandmother   . Heart disease Maternal Grandfather   . Diabetes Mother   . Hyperlipidemia Mother   . Hypertension Father   . Heart disease Father   . Hypertension Paternal Grandmother   . Cancer Paternal  Grandfather      Allergies  Allergen Reactions  . Peanut-Containing Drug Products Swelling    Swelling of mouth, lips  . Strawberry Extract Swelling    Swelling of mouth, lips  . Ultram [Tramadol] Itching     Prior to Admission medications   Medication Sig Start Date End Date Taking? Authorizing Provider  albuterol (PROVENTIL HFA;VENTOLIN HFA) 108 (90 BASE) MCG/ACT inhaler Inhale 2 puffs into the lungs every 6 (six) hours as needed for shortness of breath.   Yes Historical Provider, MD  albuterol (PROVENTIL) (2.5 MG/3ML) 0.083% nebulizer solution Take 2.5 mg by nebulization every 6 (six) hours as needed for shortness of breath.   Yes Historical Provider, MD  budesonide-formoterol (SYMBICORT) 160-4.5 MCG/ACT inhaler Inhale 2 puffs into the lungs daily.    Yes Historical Provider, MD  escitalopram (LEXAPRO) 10 MG tablet Take 0.5 tablets (5 mg total) by mouth daily. 01/19/16  Yes Barton Dubois, MD  Insulin Glargine (BASAGLAR KWIKPEN) 100 UNIT/ML SOPN Inject 0.26 mLs (26 Units total) into the skin at bedtime. 01/19/16  Yes Barton Dubois, MD  metoCLOPramide (REGLAN) 10 MG tablet Take 1 tablet (10 mg total) by mouth 3 (three) times daily before meals. 01/19/16  Yes Barton Dubois, MD  montelukast (SINGULAIR) 10 MG tablet Take 10 mg by mouth daily.   Yes Historical Provider, MD  NOVOLOG FLEXPEN 100 UNIT/ML FlexPen 1 UNIT FOR EVERY 10 GRAMS CARB W/MEALS/SNACKS PLUS SLIDING SCALE: 1 UNIT GLUCOSE 50 ABOVE 150.TDD 20 12/26/15  Yes Historical Provider, MD  pantoprazole (PROTONIX) 40 MG tablet Take 1 tablet (40 mg total) by mouth 2 (two) times daily. 01/19/16  Yes Barton Dubois, MD  potassium chloride SA (K-DUR,KLOR-CON) 20 MEQ tablet Take 1 tablet (20 mEq total) by mouth daily.  01/19/16  Yes Barton Dubois, MD  promethazine (PHENERGAN) 25 MG tablet Take 1 tablet (25 mg total) by mouth every 8 (eight) hours as needed for nausea or vomiting. 01/19/16  Yes Barton Dubois, MD  Vitamin D, Ergocalciferol,  (DRISDOL) 50000 units CAPS capsule TAKE ONE CAPSULE (50,000 UNITS TOTAL) BY MOUTH ONCE A WEEK. Saturday 12/27/15  Yes Historical Provider, MD    Review of Systems:  Constitutional:  No weight loss, night sweats, Fevers, chills, fatigue.  Head&Eyes: No headache.  No vision loss.  No eye pain or scotoma ENT:  No Difficulty swallowing,Tooth/dental problems,Sore throat,  No ear ache, post nasal drip,  Cardio-vascular:  No chest pain, Orthopnea, PND, swelling in lower extremities,  dizziness, palpitations  GI:  No  vomiting, diarrhea, loss of appetite, hematochezia, melena, heartburn, indigestion, Resp:  No shortness of breath with exertion or at rest. No cough. No coughing up of blood .No wheezing.No chest wall deformity  Skin:  no rash or lesions.  GU:  no dysuria, change in color of urine, no urgency or frequency. No flank pain.  Musculoskeletal:  No joint pain or swelling. No decreased range of motion. No back pain.  Psych:  No change in mood or affect. No depression or anxiety. Neurologic: No headache, no dysesthesia, no focal weakness, no vision loss. No syncope  Physical Exam: Vitals:   01/22/16 0500 01/22/16 0525 01/22/16 0530 01/22/16 0830  BP: 106/75 106/75 130/90 143/97  Pulse: (!) 127 (!) 128 (!) 125 (!) 122  Resp:  19  23  Temp:      TempSrc:      SpO2: 100% 100% 100% 100%   General:  A&O x 3, NAD, nontoxic, pleasant/cooperative Head/Eye: No conjunctival hemorrhage, no icterus, [place]/AT, No nystagmus ENT:  No icterus,  No thrush, good dentition, no pharyngeal exudate Neck:  No masses, no lymphadenpathy, no bruits CV:  RRR, no rub, no gallop, no S3 Lung:  CTAB, good air movement, no wheeze, no rhonchi Abdomen: soft/NT, +BS, nondistended, no peritoneal signs Ext: No cyanosis, No rashes, No petechiae, No lymphangitis, No edema Neuro: CNII-XII intact, strength 4/5 in bilateral upper and lower extremities, no dysmetria  Labs on Admission:  Basic Metabolic  Panel:  Recent Labs Lab 01/17/16 2208 01/18/16 0201 01/18/16 0630 01/19/16 0745 01/22/16 0329 01/22/16 0632  NA 137 138 137 138 135 135  K 3.7 3.5 3.4* 3.2* 3.0* 2.8*  CL 109 109 108 104 105 103  CO2 17* 19* 19* 22  --  16*  GLUCOSE 177* 141* 174* 280* 177* 141*  BUN 12 11 10 7  21* 20  CREATININE 0.48 0.36* 0.50 0.35* 0.70 0.75  CALCIUM 8.8* 8.9 9.0 8.3*  --  8.6*  MG  --   --   --   --   --  1.6*   Liver Function Tests:  Recent Labs Lab 01/16/16 2226  AST 31  ALT 16  ALKPHOS 108  BILITOT 1.2  PROT 8.7*  ALBUMIN 4.7    Recent Labs Lab 01/16/16 2226  LIPASE 11   No results for input(s): AMMONIA in the last 168 hours. CBC:  Recent Labs Lab 01/16/16 2226 01/17/16 0609 01/18/16 0201 01/22/16 0329 01/22/16 0632  WBC 9.3  --  9.4  --  7.3  NEUTROABS  --   --   --   --  3.9  HGB 14.4 15.6* 12.1 15.3* 12.7  HCT 42.3 46.0 36.2 45.0 36.9  MCV 86.0  --  88.3  --  84.6  PLT 472*  --  398  --  428*   Coagulation Profile: No results for input(s): INR, PROTIME in the last 168 hours. Cardiac Enzymes: No results for input(s): CKTOTAL, CKMB, CKMBINDEX, TROPONINI in the last 168 hours. BNP: Invalid input(s): POCBNP CBG:  Recent Labs Lab 01/18/16 2216 01/19/16 0810 01/19/16 1135 01/22/16 0138 01/22/16 0703  GLUCAP 257* 261* 134* 263* 124*   Urine analysis:    Component Value Date/Time   COLORURINE YELLOW 01/22/2016 0315   APPEARANCEUR CLOUDY (A) 01/22/2016 0315   LABSPEC 1.011 01/22/2016 0315   PHURINE 6.0 01/22/2016 0315   GLUCOSEU >=500 (A) 01/22/2016 0315   GLUCOSEU >=1000 11/07/2012 1205   HGBUR SMALL (A) 01/22/2016 0315   BILIRUBINUR NEGATIVE 01/22/2016 0315   KETONESUR 80 (A) 01/22/2016 0315   PROTEINUR NEGATIVE 01/22/2016 0315   UROBILINOGEN 0.2 11/24/2014 1045   NITRITE NEGATIVE 01/22/2016 0315   LEUKOCYTESUR LARGE (A) 01/22/2016 0315   Sepsis Labs: @LABRCNTIP (procalcitonin:4,lacticidven:4) ) Recent Results (from the past 240 hour(s))   Culture, Urine     Status: Abnormal   Collection Time: 01/17/16  7:03 AM  Result Value Ref Range Status   Specimen Description URINE, CLEAN CATCH  Final   Special Requests NONE  Final   Culture MULTIPLE SPECIES PRESENT, SUGGEST RECOLLECTION (A)  Final   Report Status 01/18/2016 FINAL  Final  MRSA PCR Screening     Status: None   Collection Time: 01/17/16  9:02 AM  Result Value Ref Range Status   MRSA by PCR NEGATIVE NEGATIVE Final    Comment:        The GeneXpert MRSA Assay (FDA approved for NASAL specimens only), is one component of a comprehensive MRSA colonization surveillance program. It is not intended to diagnose MRSA infection nor to guide or monitor treatment for MRSA infections.      Radiological Exams on Admission: No results found.     Time spent:50 minutes Code Status:   FULL Family Communication:  No Family at bedside Disposition Plan: expect 1-2 day hospitalization Consults called: none DVT Prophylaxis:  Lovenox [place]  Eniola Cerullo, DO  [facility] Pager [phone]  If 7PM-7AM, please contact night-coverage [email or web address] Password [email or web address] 01/22/2016, 8:45 AM

## 2016-01-22 NOTE — ED Provider Notes (Signed)
[facility] DEPT Provider Note   CSN: [id number] Arrival date & time: 01/22/16  0122     History   Chief Complaint Chief Complaint  Patient presents with  . Urinary Retention  . Tachycardia    HPI [name] is a 26 y.o. female.  Patient presents with lower abdominal pain and retention of urine. Last urinated approximately 6 hours ago. No dysuria or difficulty urinating prior to sudden onset retention. No vomiting, fever. She also reports constipation with no bowel movement in 2 days. She is s/p cholescystectomy and reports her usual habit is multiple loose BMs per day. She denies any history of retention in the past. She was recently admitted with DKA and reports since discharge she has been taking her medications as prescribed and monitoring her sugar regularly.    The history is provided by the patient. No language interpreter was used.    Past Medical History:  Diagnosis Date  . Anxiety   . Arthritis   . Asthma   . Diabetes mellitus 2007   IDDM.  poorly controlled, multiple admits with DKA  . Gallstones   . Heart murmur   . Hepatic steatosis 11/26/2014   and hepatomegaly  . Hypertension    NOT CURRENTLY ON ANY BP MED  . Liver mass 11/26/2014  . Pancreatitis, acute 11/26/2014    Patient Active Problem List   Diagnosis Date Noted  . Depression   . Diarrhea 11/09/2015  . Acute urinary retention   . Gastroparesis due to DM ([place]) 07/10/2015  . GERD (gastroesophageal reflux disease) 07/10/2015  . Depression with anxiety 07/10/2015  . Uncontrolled type 1 diabetes mellitus with hyperglycemia ([place])   . Gastroparesis 06/22/2015  . Altered mental status 06/22/2015  . Volume depletion 06/10/2015  . Protein-calorie malnutrition, severe 06/10/2015  . Hyperglycemia   . Intractable vomiting with nausea   . Elevated bilirubin   . Type 1 diabetes mellitus with hyperglycemia ([place]) 05/29/2015  . Hematemesis with nausea   . Intractable vomiting 05/28/2015  .  Intractable nausea and vomiting 05/28/2015  . Abdominal pain in female   . Abdominal pain 05/24/2015  . Hypertension 05/24/2015  . Dehydration   . Diabetic gastroparesis ([place])   . Chronic diastolic heart failure ([place]) 04/11/2015  . Hematemesis 04/08/2015  . DKA (diabetic ketoacidoses) ([place]) 03/22/2015  . Nausea vomiting and diarrhea 03/22/2015  . Nausea & vomiting   . Diabetic ketoacidosis without coma associated with type 1 diabetes mellitus ([place])   . S/P laparoscopic cholecystectomy 02/11/2015  . Status post laparoscopic cholecystectomy 02/11/2015  . Postextubation stridor   . Nausea and vomiting 11/27/2014  . Pancreatitis, acute 11/26/2014  . Volume overload 11/26/2014  . Hypokalemia 11/26/2014  . Hepatic steatosis 11/26/2014  . Liver mass 11/26/2014  . Sepsis ([place]) 11/25/2014  . Sinus tachycardia 11/25/2014  . Hypomagnesemia 11/25/2014  . Hypophosphatemia 11/25/2014  . Elevated amylase and lipase 11/25/2014  . DKA, type 1 ([place]) 11/24/2014  . Elevated LFTs 11/24/2014  . Acute kidney injury ([place]) 11/24/2014  . Migraine headache 11/24/2014  . Asthma 06/29/2012  . Uncontrolled type 1 diabetes mellitus ([place]) 06/19/2010  . Goiter, unspecified 06/19/2010    Past Surgical History:  Procedure Laterality Date  . CHOLECYSTECTOMY N/A 02/11/2015   Procedure: LAPAROSCOPIC CHOLECYSTECTOMY WITH INTRAOPERATIVE CHOLANGIOGRAM;  Surgeon: Greer Pickerel, MD;  Location: [facility] ORS;  Service: General;  Laterality: N/A;  . ESOPHAGOGASTRODUODENOSCOPY (EGD) WITH PROPOFOL Left 09/20/2014   Procedure: ESOPHAGOGASTRODUODENOSCOPY (EGD) WITH PROPOFOL;  Surgeon: Arta Silence, MD;  Location: [facility] ENDOSCOPY;  Service: Endoscopy;  Laterality: Left;  . WISDOM TOOTH EXTRACTION      OB History    Gravida Para Term Preterm AB Living   2 1 0 1 1 1    SAB TAB Ectopic Multiple Live Births   0 1 0 0 1       Home Medications    Prior to Admission medications   Medication Sig Start Date End Date Taking?  Authorizing Provider  albuterol (PROVENTIL HFA;VENTOLIN HFA) 108 (90 BASE) MCG/ACT inhaler Inhale 2 puffs into the lungs every 6 (six) hours as needed for shortness of breath.    Historical Provider, MD  albuterol (PROVENTIL) (2.5 MG/3ML) 0.083% nebulizer solution Take 2.5 mg by nebulization every 6 (six) hours as needed for shortness of breath.    Historical Provider, MD  budesonide-formoterol (SYMBICORT) 160-4.5 MCG/ACT inhaler Inhale 2 puffs into the lungs daily.     Historical Provider, MD  escitalopram (LEXAPRO) 10 MG tablet Take 0.5 tablets (5 mg total) by mouth daily. 01/19/16   Barton Dubois, MD  Insulin Glargine (BASAGLAR KWIKPEN) 100 UNIT/ML SOPN Inject 0.26 mLs (26 Units total) into the skin at bedtime. 01/19/16   Barton Dubois, MD  metoCLOPramide (REGLAN) 10 MG tablet Take 1 tablet (10 mg total) by mouth 3 (three) times daily before meals. 01/19/16   Barton Dubois, MD  montelukast (SINGULAIR) 10 MG tablet Take 10 mg by mouth daily.    Historical Provider, MD  NOVOLOG FLEXPEN 100 UNIT/ML FlexPen 1 UNIT FOR EVERY 10 GRAMS CARB W/MEALS/SNACKS PLUS SLIDING SCALE: 1 UNIT GLUCOSE 50 ABOVE 150.TDD 20 12/26/15   Historical Provider, MD  pantoprazole (PROTONIX) 40 MG tablet Take 1 tablet (40 mg total) by mouth 2 (two) times daily. 01/19/16   Barton Dubois, MD  potassium chloride SA (K-DUR,KLOR-CON) 20 MEQ tablet Take 1 tablet (20 mEq total) by mouth daily. 01/19/16   Barton Dubois, MD  promethazine (PHENERGAN) 25 MG tablet Take 1 tablet (25 mg total) by mouth every 8 (eight) hours as needed for nausea or vomiting. 01/19/16   Barton Dubois, MD  Vitamin D, Ergocalciferol, (DRISDOL) 50000 units CAPS capsule TAKE ONE CAPSULE (50,000 UNITS TOTAL) BY MOUTH ONCE A WEEK. Saturday 12/27/15   Historical Provider, MD    Family History Family History  Problem Relation Age of Onset  . Heart disease Maternal Grandmother   . Heart disease Maternal Grandfather   . Diabetes Mother   . Hyperlipidemia Mother   .  Hypertension Father   . Heart disease Father   . Hypertension Paternal Grandmother   . Cancer Paternal Grandfather     Social History Social History  Substance Use Topics  . Smoking status: Never Smoker  . Smokeless tobacco: Never Used  . Alcohol use No     Allergies   Peanut-containing drug products; Strawberry extract; and Ultram [tramadol]   Review of Systems Review of Systems  Constitutional: Negative for chills and fever.  Respiratory: Negative.  Negative for shortness of breath.   Cardiovascular: Negative.  Negative for chest pain.  Gastrointestinal: Positive for abdominal distention, abdominal pain and constipation. Negative for nausea and vomiting.  Genitourinary: Positive for difficulty urinating. Negative for dysuria and flank pain.  Musculoskeletal: Negative.  Negative for back pain.  Skin: Negative.   Neurological: Negative.      Physical Exam Updated Vital Signs BP 105/76 (BP Location: Left Arm)   Pulse (!) 147   Temp 97.6 F (36.4 C) (Oral)   Resp 18   LMP  (LMP Unknown)  SpO2 98%   Physical Exam  Constitutional: She is oriented to person, place, and time. She appears well-developed and well-nourished.  Neck: Normal range of motion.  Cardiovascular: Tachycardia present.   No murmur heard. Pulmonary/Chest: Effort normal. No respiratory distress. She has no wheezes. She has no rales.  Abdominal: She exhibits distension. There is tenderness. There is guarding.  Lower abdominal distention and firmness. Tender to palpation with guarding. Upper abdomen non-tender, non-distended.  Neurological: She is alert and oriented to person, place, and time.  Skin: Skin is warm and dry.  Psychiatric: She has a normal mood and affect.     ED Treatments / Results  Labs (all labs ordered are listed, but only abnormal results are displayed) Labs Reviewed  CBG MONITORING, ED - Abnormal; Notable for the following:       Result Value   Glucose-Capillary 263 (*)     All other components within normal limits  URINALYSIS, ROUTINE W REFLEX MICROSCOPIC  I-STAT CHEM 8, ED   Results for orders placed or performed during the hospital encounter of 01/22/16  Urinalysis, Routine w reflex microscopic  Result Value Ref Range   Color, Urine YELLOW YELLOW   APPearance CLOUDY (A) CLEAR   Specific Gravity, Urine 1.011 1.005 - 1.030   pH 6.0 5.0 - 8.0   Glucose, UA >=500 (A) NEGATIVE mg/dL   Hgb urine dipstick SMALL (A) NEGATIVE   Bilirubin Urine NEGATIVE NEGATIVE   Ketones, ur 80 (A) NEGATIVE mg/dL   Protein, ur NEGATIVE NEGATIVE mg/dL   Nitrite NEGATIVE NEGATIVE   Leukocytes, UA LARGE (A) NEGATIVE   RBC / HPF TOO NUMEROUS TO COUNT 0 - 5 RBC/hpf   WBC, UA TOO NUMEROUS TO COUNT 0 - 5 WBC/hpf   Bacteria, UA RARE (A) NONE SEEN   Squamous Epithelial / LPF NONE SEEN NONE SEEN   WBC Clumps PRESENT    Amorphous Crystal PRESENT    Ca Oxalate Crys, UA PRESENT   POC CBG, ED  Result Value Ref Range   Glucose-Capillary 263 (H) 65 - 99 mg/dL   Comment 1 Notify RN    Comment 2 Document in Chart   I-stat Chem 8, ED  Result Value Ref Range   Sodium 135 135 - 145 mmol/L   Potassium 3.0 (L) 3.5 - 5.1 mmol/L   Chloride 105 101 - 111 mmol/L   BUN 21 (H) 6 - 20 mg/dL   Creatinine, Ser 0.70 0.44 - 1.00 mg/dL   Glucose, Bld 177 (H) 65 - 99 mg/dL   Calcium, Ion 1.10 (L) 1.15 - 1.40 mmol/L   TCO2 14 0 - 100 mmol/L   Hemoglobin 15.3 (H) 12.0 - 15.0 g/dL   HCT 45.0 36.0 - 46.0 %     EKG  EKG Interpretation None       Radiology No results found.  Procedures Procedures (including critical care time)  Medications Ordered in ED Medications - No data to display   Initial Impression / Assessment and Plan / ED Course  I have reviewed the triage vital signs and the nursing notes.  Pertinent labs & imaging results that were available during my care of the patient were reviewed by me and considered in my medical decision making (see chart for details).  Clinical  Course     Patient presents with complaint of urinary retention that started yesterday. She reports pain when she attempts to pass urine but is unable. No fever.   Initial bladder scan showed 900 cc urine. Shortly after  being placed in the examination room the patient asks for a bedside commode and was able to move her bowels and pass urine. Bladder scan performed after urination showed 300 cc urine. Foley catheter was placed and showed a measured amount of urine of 600 cc dark yellow appearing urine. She is found to have a UTI and Rocephin is provided. She is also hypokalemic. PO potassium supplementation also provided.  Blood sure is 263 on arrival. She is significantly tachycardic. No vomiting in ED. Pain is managed.   Patient care signed out to [facility] for additional labs. Anticipate admission.  Final Clinical Impressions(s) / ED Diagnoses   Final diagnoses:  None   1. Acute urinary retention 2. UTI 3. Hypokalemia 4. Hyperglycemia  New Prescriptions New Prescriptions   No medications on file     Charlann Lange, PA-C 01/23/16 [place], MD 01/23/16 [phone]

## 2016-01-23 DIAGNOSIS — K3184 Gastroparesis: Secondary | ICD-10-CM | POA: Diagnosis present

## 2016-01-23 DIAGNOSIS — F419 Anxiety disorder, unspecified: Secondary | ICD-10-CM | POA: Diagnosis present

## 2016-01-23 DIAGNOSIS — Z765 Malingerer [conscious simulation]: Secondary | ICD-10-CM | POA: Diagnosis not present

## 2016-01-23 DIAGNOSIS — J45909 Unspecified asthma, uncomplicated: Secondary | ICD-10-CM | POA: Diagnosis present

## 2016-01-23 DIAGNOSIS — R140 Abdominal distension (gaseous): Secondary | ICD-10-CM | POA: Diagnosis not present

## 2016-01-23 DIAGNOSIS — E876 Hypokalemia: Secondary | ICD-10-CM | POA: Diagnosis present

## 2016-01-23 DIAGNOSIS — B3741 Candidal cystitis and urethritis: Secondary | ICD-10-CM

## 2016-01-23 DIAGNOSIS — Z91010 Allergy to peanuts: Secondary | ICD-10-CM | POA: Diagnosis not present

## 2016-01-23 DIAGNOSIS — R338 Other retention of urine: Secondary | ICD-10-CM | POA: Diagnosis not present

## 2016-01-23 DIAGNOSIS — I10 Essential (primary) hypertension: Secondary | ICD-10-CM | POA: Diagnosis present

## 2016-01-23 DIAGNOSIS — R339 Retention of urine, unspecified: Secondary | ICD-10-CM | POA: Diagnosis present

## 2016-01-23 DIAGNOSIS — N390 Urinary tract infection, site not specified: Secondary | ICD-10-CM | POA: Diagnosis not present

## 2016-01-23 DIAGNOSIS — Z833 Family history of diabetes mellitus: Secondary | ICD-10-CM | POA: Diagnosis not present

## 2016-01-23 DIAGNOSIS — K5900 Constipation, unspecified: Secondary | ICD-10-CM | POA: Diagnosis present

## 2016-01-23 DIAGNOSIS — Z794 Long term (current) use of insulin: Secondary | ICD-10-CM | POA: Diagnosis not present

## 2016-01-23 DIAGNOSIS — Z885 Allergy status to narcotic agent status: Secondary | ICD-10-CM | POA: Diagnosis not present

## 2016-01-23 DIAGNOSIS — Z8249 Family history of ischemic heart disease and other diseases of the circulatory system: Secondary | ICD-10-CM | POA: Diagnosis not present

## 2016-01-23 DIAGNOSIS — K226 Gastro-esophageal laceration-hemorrhage syndrome: Secondary | ICD-10-CM | POA: Diagnosis present

## 2016-01-23 DIAGNOSIS — E1043 Type 1 diabetes mellitus with diabetic autonomic (poly)neuropathy: Secondary | ICD-10-CM | POA: Diagnosis present

## 2016-01-23 DIAGNOSIS — Z91018 Allergy to other foods: Secondary | ICD-10-CM | POA: Diagnosis not present

## 2016-01-23 DIAGNOSIS — E860 Dehydration: Secondary | ICD-10-CM | POA: Diagnosis present

## 2016-01-23 DIAGNOSIS — E1010 Type 1 diabetes mellitus with ketoacidosis without coma: Secondary | ICD-10-CM | POA: Diagnosis not present

## 2016-01-23 DIAGNOSIS — G8929 Other chronic pain: Secondary | ICD-10-CM | POA: Diagnosis present

## 2016-01-23 DIAGNOSIS — Z7951 Long term (current) use of inhaled steroids: Secondary | ICD-10-CM | POA: Diagnosis not present

## 2016-01-23 DIAGNOSIS — R000 Tachycardia, unspecified: Secondary | ICD-10-CM | POA: Diagnosis present

## 2016-01-23 DIAGNOSIS — K760 Fatty (change of) liver, not elsewhere classified: Secondary | ICD-10-CM | POA: Diagnosis present

## 2016-01-23 LAB — BASIC METABOLIC PANEL
ANION GAP: 6 (ref 5–15)
Anion gap: 12 (ref 5–15)
Anion gap: 8 (ref 5–15)
Anion gap: 11 (ref 5–15)
Anion gap: 8 (ref 5–15)
BUN: 9 mg/dL (ref 6–20)
BUN: 7 mg/dL (ref 6–20)
CO2: 18 mmol/L — ABNORMAL LOW (ref 22–32)
CO2: 18 mmol/L — ABNORMAL LOW (ref 22–32)
CO2: 18 mmol/L — ABNORMAL LOW (ref 22–32)
CO2: 20 mmol/L — ABNORMAL LOW (ref 22–32)
CO2: 23 mmol/L (ref 22–32)
CREATININE: 0.45 mg/dL (ref 0.44–1.00)
CREATININE: 0.45 mg/dL (ref 0.44–1.00)
CREATININE: 0.45 mg/dL (ref 0.44–1.00)
Calcium: 7.7 mg/dL — ABNORMAL LOW (ref 8.9–10.3)
Calcium: 8.2 mg/dL — ABNORMAL LOW (ref 8.9–10.3)
Calcium: 8.1 mg/dL — ABNORMAL LOW (ref 8.9–10.3)
Calcium: 8.2 mg/dL — ABNORMAL LOW (ref 8.9–10.3)
Calcium: 8.3 mg/dL — ABNORMAL LOW (ref 8.9–10.3)
Chloride: 112 mmol/L — ABNORMAL HIGH (ref 101–111)
Chloride: 108 mmol/L (ref 101–111)
Chloride: 109 mmol/L (ref 101–111)
Chloride: 105 mmol/L (ref 101–111)
Chloride: 106 mmol/L (ref 101–111)
Creatinine, Ser: <0.3 mg/dL — ABNORMAL LOW (ref 0.44–1.00)
Creatinine, Ser: 0.43 mg/dL — ABNORMAL LOW (ref 0.44–1.00)
GFR calc Af Amer: >60 mL/min (ref >60)
GFR calc Af Amer: >60 mL/min (ref >60)
GFR calc Af Amer: >60 mL/min (ref >60)
GFR calc Af Amer: >60 mL/min (ref >60)
Glucose, Bld: 114 mg/dL — ABNORMAL HIGH (ref 65–99)
Glucose, Bld: 255 mg/dL — ABNORMAL HIGH (ref 65–99)
Glucose, Bld: 162 mg/dL — ABNORMAL HIGH (ref 65–99)
Glucose, Bld: 152 mg/dL — ABNORMAL HIGH (ref 65–99)
Glucose, Bld: 199 mg/dL — ABNORMAL HIGH (ref 65–99)
POTASSIUM: 3.5 mmol/L (ref 3.5–5.1)
POTASSIUM: 3.5 mmol/L (ref 3.5–5.1)
POTASSIUM: 3 mmol/L — AB (ref 3.5–5.1)
POTASSIUM: 3.2 mmol/L — AB (ref 3.5–5.1)
Potassium: 3.2 mmol/L — ABNORMAL LOW (ref 3.5–5.1)
SODIUM: 138 mmol/L (ref 135–145)
SODIUM: 135 mmol/L (ref 135–145)
SODIUM: 136 mmol/L (ref 135–145)
SODIUM: 137 mmol/L (ref 135–145)
Sodium: 136 mmol/L (ref 135–145)

## 2016-01-23 LAB — GLUCOSE, CAPILLARY
GLUCOSE-CAPILLARY: 112 mg/dL — AB (ref 65–99)
GLUCOSE-CAPILLARY: 114 mg/dL — AB (ref 65–99)
GLUCOSE-CAPILLARY: 117 mg/dL — AB (ref 65–99)
GLUCOSE-CAPILLARY: 123 mg/dL — AB (ref 65–99)
GLUCOSE-CAPILLARY: 126 mg/dL — AB (ref 65–99)
GLUCOSE-CAPILLARY: 136 mg/dL — AB (ref 65–99)
GLUCOSE-CAPILLARY: 145 mg/dL — AB (ref 65–99)
GLUCOSE-CAPILLARY: 152 mg/dL — AB (ref 65–99)
GLUCOSE-CAPILLARY: 153 mg/dL — AB (ref 65–99)
GLUCOSE-CAPILLARY: 165 mg/dL — AB (ref 65–99)
GLUCOSE-CAPILLARY: 175 mg/dL — AB (ref 65–99)
GLUCOSE-CAPILLARY: 196 mg/dL — AB (ref 65–99)
GLUCOSE-CAPILLARY: 206 mg/dL — AB (ref 65–99)
GLUCOSE-CAPILLARY: 210 mg/dL — AB (ref 65–99)
GLUCOSE-CAPILLARY: 218 mg/dL — AB (ref 65–99)
Glucose-Capillary: 146 mg/dL — ABNORMAL HIGH (ref 65–99)
Glucose-Capillary: 98 mg/dL (ref 65–99)
Glucose-Capillary: 112 mg/dL — ABNORMAL HIGH (ref 65–99)
Glucose-Capillary: 188 mg/dL — ABNORMAL HIGH (ref 65–99)
Glucose-Capillary: 202 mg/dL — ABNORMAL HIGH (ref 65–99)
Glucose-Capillary: 232 mg/dL — ABNORMAL HIGH (ref 65–99)
Glucose-Capillary: 156 mg/dL — ABNORMAL HIGH (ref 65–99)
Glucose-Capillary: 174 mg/dL — ABNORMAL HIGH (ref 65–99)
Glucose-Capillary: 179 mg/dL — ABNORMAL HIGH (ref 65–99)

## 2016-01-23 LAB — URINE CULTURE

## 2016-01-23 LAB — MAGNESIUM: MAGNESIUM: 1.5 mg/dL — AB (ref 1.7–2.4)

## 2016-01-23 MED ORDER — KETOROLAC TROMETHAMINE 15 MG/ML IJ SOLN: 7.5000 mg | Freq: Three times a day (TID) | INTRAMUSCULAR | Status: DC | PRN

## 2016-01-23 MED ORDER — DEXTROSE-NACL 5-0.9 % IV SOLN
INTRAVENOUS | Status: DC
  Administered 2016-01-23: 10:00:00 via INTRAVENOUS

## 2016-01-23 MED ORDER — DEXTROSE-NACL 5-0.9 % IV SOLN: INTRAVENOUS | Status: AC

## 2016-01-23 MED ORDER — DEXTROSE 10 % IV SOLN
INTRAVENOUS | Status: DC
  Administered 2016-01-23: 03:00:00 via INTRAVENOUS
  Filled 2016-01-23 (×2): qty 1000

## 2016-01-23 MED ORDER — SODIUM CHLORIDE 0.9 % IV BOLUS (SEPSIS)
1000.0000 mL | Freq: Once | INTRAVENOUS | Status: AC
  Administered 2016-01-23: 1000 mL via INTRAVENOUS

## 2016-01-23 MED ORDER — FLUCONAZOLE IN SODIUM CHLORIDE 200-0.9 MG/100ML-% IV SOLN
200.0000 mg | Freq: Once | INTRAVENOUS | Status: AC
  Administered 2016-01-23: 200 mg via INTRAVENOUS
  Filled 2016-01-23: qty 100

## 2016-01-23 MED ORDER — SODIUM CHLORIDE 0.9 % IV BOLUS (SEPSIS)
1500.0000 mL | Freq: Once | INTRAVENOUS | Status: AC
  Administered 2016-01-23: 1500 mL via INTRAVENOUS

## 2016-01-23 MED ORDER — POTASSIUM CHLORIDE 10 MEQ/100ML IV SOLN
10.0000 meq | INTRAVENOUS | Status: AC
Start: 2016-01-23 — End: 2016-01-23
  Administered 2016-01-23 (×4): 10 meq via INTRAVENOUS
  Filled 2016-01-23 (×4): qty 100

## 2016-01-23 MED ORDER — FLUCONAZOLE IN SODIUM CHLORIDE 100-0.9 MG/50ML-% IV SOLN: 100.0000 mg | INTRAVENOUS | Status: DC

## 2016-01-23 MED ORDER — INSULIN GLARGINE 100 UNIT/ML ~~LOC~~ SOLN
20.0000 [IU] | SUBCUTANEOUS | Status: DC
  Administered 2016-01-23: 20 [IU] via SUBCUTANEOUS
  Filled 2016-01-23: qty 0.2

## 2016-01-23 MED ORDER — INSULIN ASPART 100 UNIT/ML ~~LOC~~ SOLN
0.0000 [IU] | Freq: Three times a day (TID) | SUBCUTANEOUS | Status: DC
  Administered 2016-01-24: 5 [IU] via SUBCUTANEOUS
  Administered 2016-01-24: 1 [IU] via SUBCUTANEOUS
  Administered 2016-01-24 – 2016-01-25 (×2): 3 [IU] via SUBCUTANEOUS
  Administered 2016-01-25: 2 [IU] via SUBCUTANEOUS

## 2016-01-23 MED ORDER — LACTATED RINGERS IV BOLUS (SEPSIS)
1000.0000 mL | Freq: Once | INTRAVENOUS | Status: AC
  Administered 2016-01-23: 1000 mL via INTRAVENOUS

## 2016-01-23 MED ORDER — PANTOPRAZOLE SODIUM 40 MG IV SOLR
40.0000 mg | Freq: Two times a day (BID) | INTRAVENOUS | Status: DC
  Administered 2016-01-23 – 2016-01-24 (×2): 40 mg via INTRAVENOUS
  Filled 2016-01-23 (×2): qty 40

## 2016-01-23 MED ORDER — POTASSIUM CHLORIDE CRYS ER 20 MEQ PO TBCR
40.0000 meq | EXTENDED_RELEASE_TABLET | Freq: Once | ORAL | Status: AC
  Administered 2016-01-23: 40 meq via ORAL
  Filled 2016-01-23: qty 2

## 2016-01-23 NOTE — Progress Notes (Signed)
Inpatient Diabetes Program Recommendations  AACE/ADA: New Consensus Statement on Inpatient Glycemic Control (2015)  Target Ranges:  Prepandial:   less than 140 mg/dL      Peak postprandial:   less than 180 mg/dL (1-2 hours)      Critically ill patients:  140 - 180 mg/dL   Lab Results  Component Value Date   GLUCAP 232 (H) 01/23/2016   HGBA1C 10.1 (H) 01/18/2016   Results for BRENDIA, SOLER (MRN [id number]) as of 01/23/2016 09:51  Ref. Range 01/23/2016 04:51 01/23/2016 05:53 01/23/2016 07:04 01/23/2016 08:06 01/23/2016 09:06  Glucose-Capillary Latest Ref Range: 65 - 99 mg/dL 202 (H) 153 (H) 218 (H) 145 (H) 232 (H)    Admit with: Vomiting/ DKA  History: T1DM, Gastroparesis  Home DM Meds: Basaglar 20 units QHS                             Novolog 0-20 units QID  Current Insulin Orders: IV Insulin drip (started at 7am)   -Patient saw her Endocrinologist (Dr. Francetta Found with [facility] in [place]) on 12/27/15.  At that visit pt was given the following instructions:  Patient Instructions - Gerome Apley, MD - 12/27/2015 10:14 AM EST 1. Lower Basaglar Kwikpen from 20 to 16 units at bedtime. Can raise back up to 18 units if fasting blood sugars above 120. Will switch to Lantus Solostar when you run out of Basaglar. 2. Continue Novolog Flexpen 1 unit per 10 grams carbohydrates plus sliding scale. 3. Try to limit meals to 45-60 grams carbohydrates and snacks to under 15 grams. 4. Check blood sugars before and 1-2 hours after each meal. Bring glucose logs and meter to each visit.  -Patient well known to the Inpatient DM Program.  This is pt's 13th admission to [facility] since January of 2017.  Not ready to be transitioned to SQ insulin. Continue to follow.  Thank you. Lorenda Peck, RD, LDN, CDE Inpatient Diabetes Coordinator [phone]

## 2016-01-23 NOTE — Progress Notes (Signed)
PROGRESS NOTE  [name] [id number] DOB: 1990/12/18 DOA: 01/22/2016 PCP: Vicenta Aly, FNP  HPI/Recap of past 24 hours:  Continue vomit, nonbloody,  Continue to have sinus tachycardia She looks drowsy, c/o abdominal pain, foley in placed  Assessment/Plan: Active Problems:   DKA, type 1 (HCC)   Hypomagnesemia   Hypokalemia   Uncontrolled type 1 diabetes mellitus with hyperglycemia ([place])   Acute urinary retention   UTI (urinary tract infection)   DKA in DM type 1, recurrent, frequent hospitalization -patient started on IV insulin with q 1 hour CBG check and q 4 hour BMPs -pt started on aggressive fluid resuscitation -Electrolytes were monitored and repleted -transitioned to [facility] insulin once anion gap is closed -she normally takes insulin glargine 20 units q hs, which she claims compliance -11/10/2015 HbA1C--10.3  UTI/Urine Retention/yeast cystitis -foley placed in ED -voiding trial prior to d/c -she has had urine retention problems in past due to opioids -empiric ceftriaxone pending urine culture -01/22/16 UA--TNTC WBC Start diflucan   Chronic Abdominal pain -likely gastroparesis related to her DM  -she  Also has cystitis, urine culture + yeast -abd pain very similar character to numerous prior admits -11/02/2015 CT abdomen pelvis--stable hepatic masses, normal pancreas, normal bowel wall thickening or inflammation -04/21/2015 CT abdomen and pelvis--stable hepatic masses, normal pancreas, normal bowel wall thickening or inflammation, normal spleen and pancreas -09/23/2014 EGD--diffuse gastritis without clear explanation of the patient's hematemesis -Continue IV fluid, metoclopramide -minimize opioids  -she has narcotic seeking behavior -scheduled zofran q 6hrs -continue PPI  Hematemesis -resolved since her recent admission -Hemoglobin appears to be stable, likely secondary to forceful vomiting, 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 -Patient had  same complaint on numerous previous admissions -09/23/2014 EGD--diffuse gastritis without clear explanation of the patient's hematemesis -pregnancy test negative, uds + marijuan  -Baseline hemoglobin 11-12, -serial CBC   Asthma -No wheezing or stridor -Stable on room air -Continue LABA -continue singulair  Sinus tachycardia/Dehydration -Secondary to stress response from the patient's volume depletion, vomiting, and pain -As discussed, aggressive fluid resuscitation and conservative measures usually results in an improvement -Echocardiogram 05/24/2014 showed EF Q000111Q, grade 1 diastolic dysfunction -A999333  Hypokalemia/Hypomagnesemia -replete    Code Status: full  Family Communication: patient   Disposition Plan: remain in stepdown on insulin drip   Consultants:  none  Procedures:  none  Antibiotics:  none   Objective: BP (!) 164/105   Pulse (!) 118   Temp 98.9 F (37.2 C) (Oral)   Resp 20   Ht 5\' 4"  (1.626 m)   Wt 52.8 kg (116 lb 6.5 oz)   LMP  (LMP Unknown)   SpO2 99%   BMI 19.98 kg/m   Intake/Output Summary (Last 24 hours) at 01/23/16 0822 Last data filed at 01/23/16 0600  Gross per 24 hour  Intake          5123.07 ml  Output             3650 ml  Net          1473.07 ml   Filed Weights   01/22/16 0948 01/22/16 1100  Weight: 52.2 kg (115 lb) 52.8 kg (116 lb 6.5 oz)    Exam:   General:  Does not look comfortable  Cardiovascular: sinus tachycardia   Respiratory: CTABL  Abdomen: epigastric tenderness, no guarding, no rebound, positive BS  Musculoskeletal: No Edema  Neuro: aaox3  Data Reviewed: Basic Metabolic Panel:  Recent Labs Lab 01/22/16 0632  01/22/16 1425 01/22/16 1848 01/22/16 2226 01/23/16  0240 01/23/16 0640  NA 135  < > 137 135 136 136 138  K 2.8*  < > 3.6 3.9 3.1* 3.5 3.5  CL 103  < > 111 109 108 112* 108  CO2 16*  < > 15* 13* 16* 18* 18*  GLUCOSE 141*  < > 168* 175* 206* 114* 255*  BUN 20  <  > 13 11 9 9 7   CREATININE 0.75  < > 0.54 0.54 0.54 <0.30* 0.43*  CALCIUM 8.6*  < > 7.7* 8.3* 8.3* 7.7* 8.2*  MG 1.6*  --   --   --   --  1.5*  --   < > = values in this interval not displayed. Liver Function Tests:  Recent Labs Lab 01/16/16 2226  AST 31  ALT 16  ALKPHOS 108  BILITOT 1.2  PROT 8.7*  ALBUMIN 4.7    Recent Labs Lab 01/16/16 2226  LIPASE 11   No results for input(s): AMMONIA in the last 168 hours. CBC:  Recent Labs Lab 01/16/16 2226 01/17/16 0609 01/18/16 0201 01/22/16 0329 01/22/16 0632  WBC 9.3  --  9.4  --  7.3  NEUTROABS  --   --   --   --  3.9  HGB 14.4 15.6* 12.1 15.3* 12.7  HCT 42.3 46.0 36.2 45.0 36.9  MCV 86.0  --  88.3  --  84.6  PLT 472*  --  398  --  428*   Cardiac Enzymes:   No results for input(s): CKTOTAL, CKMB, CKMBINDEX, TROPONINI in the last 168 hours. BNP (last 3 results)  Recent Labs  04/11/15 0325 07/11/15 0353  BNP 43.5 6.9    ProBNP (last 3 results) No results for input(s): PROBNP in the last 8760 hours.  CBG:  Recent Labs Lab 01/23/16 0351 01/23/16 0451 01/23/16 0553 01/23/16 0704 01/23/16 0806  GLUCAP 188* 202* 153* 218* 145*    Recent Results (from the past 240 hour(s))  Culture, Urine     Status: Abnormal   Collection Time: 01/17/16  7:03 AM  Result Value Ref Range Status   Specimen Description URINE, CLEAN CATCH  Final   Special Requests NONE  Final   Culture MULTIPLE SPECIES PRESENT, SUGGEST RECOLLECTION (A)  Final   Report Status 01/18/2016 FINAL  Final  MRSA PCR Screening     Status: None   Collection Time: 01/17/16  9:02 AM  Result Value Ref Range Status   MRSA by PCR NEGATIVE NEGATIVE Final    Comment:        The GeneXpert MRSA Assay (FDA approved for NASAL specimens only), is one component of a comprehensive MRSA colonization surveillance program. It is not intended to diagnose MRSA infection nor to guide or monitor treatment for MRSA infections.      Studies: No results  found.  Scheduled Meds: . cefTRIAXone (ROCEPHIN)  IV  1 g Intravenous Q24H  . enoxaparin (LOVENOX) injection  40 mg Subcutaneous Q24H  . escitalopram  5 mg Oral Daily  . HYDROcodone-acetaminophen  1 tablet Oral Once  . metoCLOPramide  10 mg Oral TID AC  . mometasone-formoterol  2 puff Inhalation BID  . montelukast  10 mg Oral Daily  . pantoprazole  40 mg Oral BID WC  . potassium chloride SA  20 mEq Oral Daily  . sodium chloride  1,000 mL Intravenous Once  . tamsulosin  0.4 mg Oral Daily  . Vitamin D (Ergocalciferol)  50,000 Units Oral Q7 days    Continuous Infusions: . sodium chloride    .  dextrose 100 mL/hr at 01/23/16 0303  . insulin (NOVOLIN-R) infusion    . insulin (NOVOLIN-R) infusion 2.6 Units/hr (01/23/16 0809)     Time spent: 27mins  Tyberius Ryner MD, PhD  [facility] Pager [phone]. If 7PM-7AM, please contact night-coverage at [email or web address], password [facility] 01/23/2016, 8:22 AM  LOS: 0 days

## 2016-01-24 LAB — GLUCOSE, CAPILLARY
GLUCOSE-CAPILLARY: 143 mg/dL — AB (ref 65–99)
GLUCOSE-CAPILLARY: 283 mg/dL — AB (ref 65–99)
GLUCOSE-CAPILLARY: 257 mg/dL — AB (ref 65–99)
Glucose-Capillary: 139 mg/dL — ABNORMAL HIGH (ref 65–99)
Glucose-Capillary: 140 mg/dL — ABNORMAL HIGH (ref 65–99)

## 2016-01-24 LAB — BASIC METABOLIC PANEL
Anion gap: 11 (ref 5–15)
CALCIUM: 8.3 mg/dL — AB (ref 8.9–10.3)
CHLORIDE: 106 mmol/L (ref 101–111)
CO2: 22 mmol/L (ref 22–32)
CREATININE: 0.45 mg/dL (ref 0.44–1.00)
GFR calc Af Amer: >60 mL/min (ref >60)
Glucose, Bld: 229 mg/dL — ABNORMAL HIGH (ref 65–99)
Potassium: 3.4 mmol/L — ABNORMAL LOW (ref 3.5–5.1)
SODIUM: 139 mmol/L (ref 135–145)

## 2016-01-24 LAB — MAGNESIUM: MAGNESIUM: 1.3 mg/dL — AB (ref 1.7–2.4)

## 2016-01-24 MED ORDER — INSULIN ASPART 100 UNIT/ML ~~LOC~~ SOLN
6.0000 [IU] | Freq: Three times a day (TID) | SUBCUTANEOUS | Status: DC
  Administered 2016-01-24 – 2016-01-25 (×3): 6 [IU] via SUBCUTANEOUS

## 2016-01-24 MED ORDER — AMOXICILLIN-POT CLAVULANATE 875-125 MG PO TABS
1.0000 | ORAL_TABLET | Freq: Two times a day (BID) | ORAL | Status: DC
  Administered 2016-01-24 – 2016-01-25 (×3): 1 via ORAL
  Filled 2016-01-24 (×3): qty 1

## 2016-01-24 MED ORDER — INSULIN ASPART 100 UNIT/ML ~~LOC~~ SOLN: 3.0000 [IU] | Freq: Three times a day (TID) | SUBCUTANEOUS | Status: DC

## 2016-01-24 MED ORDER — INSULIN ASPART 100 UNIT/ML ~~LOC~~ SOLN: 4.0000 [IU] | Freq: Three times a day (TID) | SUBCUTANEOUS | Status: DC

## 2016-01-24 MED ORDER — PANTOPRAZOLE SODIUM 40 MG PO TBEC
40.0000 mg | DELAYED_RELEASE_TABLET | Freq: Two times a day (BID) | ORAL | Status: DC
  Administered 2016-01-24 – 2016-01-25 (×2): 40 mg via ORAL
  Filled 2016-01-24 (×2): qty 1

## 2016-01-24 MED ORDER — SODIUM CHLORIDE 0.9 % IV SOLN
INTRAVENOUS | Status: AC
  Administered 2016-01-24 – 2016-01-25 (×2): via INTRAVENOUS

## 2016-01-24 MED ORDER — SODIUM CHLORIDE 0.9 % IV BOLUS (SEPSIS)
1000.0000 mL | Freq: Once | INTRAVENOUS | Status: AC
  Administered 2016-01-24: 1000 mL via INTRAVENOUS

## 2016-01-24 MED ORDER — MAGNESIUM SULFATE 4 GM/100ML IV SOLN
4.0000 g | Freq: Once | INTRAVENOUS | Status: AC
  Administered 2016-01-24: 4 g via INTRAVENOUS
  Filled 2016-01-24: qty 100

## 2016-01-24 MED ORDER — INSULIN GLARGINE 100 UNIT/ML ~~LOC~~ SOLN
25.0000 [IU] | SUBCUTANEOUS | Status: DC
  Administered 2016-01-24: 25 [IU] via SUBCUTANEOUS
  Filled 2016-01-24: qty 0.25

## 2016-01-24 MED ORDER — FLUCONAZOLE 100 MG PO TABS
100.0000 mg | ORAL_TABLET | Freq: Every day | ORAL | Status: DC
  Administered 2016-01-24 – 2016-01-25 (×2): 100 mg via ORAL
  Filled 2016-01-24 (×2): qty 1

## 2016-01-24 NOTE — Progress Notes (Signed)
Inpatient Diabetes Program Recommendations  AACE/ADA: New Consensus Statement on Inpatient Glycemic Control (2015)  Target Ranges:  Prepandial:   less than 140 mg/dL      Peak postprandial:   less than 180 mg/dL (1-2 hours)      Critically ill patients:  140 - 180 mg/dL   Lab Results  Component Value Date   GLUCAP 283 (H) 01/24/2016   HGBA1C 10.1 (H) 01/18/2016    Review of Glycemic Control  Transitioned off insulin drip to Lantus and Novolog. Will need meal coverage insulin when po intake is > 50%.   Inpatient Diabetes Program Recommendations:    Add Novolog 4 units tidwc for meal coverage insulin if pt eats > 50% meal. F/U with endo in [place] within 1 week of discharge for diabetes management.  Continue to follow while inpatient. Thank you. Lorenda Peck, RD, LDN, CDE Inpatient Diabetes Coordinator [phone]

## 2016-01-24 NOTE — Progress Notes (Addendum)
PROGRESS NOTE  [name] [id number] DOB: 10/18/1990 DOA: 01/22/2016 PCP: Vicenta Aly, FNP  HPI/Recap of past 24 hours:  Feeling much better, off insulin drip No n/v, eat breakfast sinus tachycardia has much improved. Foley removed, has no urinated yet. Denies abdominal pain at this moment.  Assessment/Plan: Active Problems:   DKA, type 1 ([place])   Hypomagnesemia   Hypokalemia   Uncontrolled type 1 diabetes mellitus with hyperglycemia ([place])   Acute urinary retention   UTI (urinary tract infection)   DKA in DM type 1, recurrent, frequent hospitalization, she was discharged a few days ago -patient is admitted to stepdown unit and  started on IV insulin drip with q 1 hour CBG check and q 4 hour BMPs -pt started on aggressive fluid resuscitation  -Electrolytes were monitored and repleted -better, transitioned to [place] insulin on 1/5-started  insulin glargine 25 units daily, meal coverage 6units, ssi -11/10/2015 HbA1C--10.3  Per her endocrinology recommendation: "strongly recommended keeping meals to 45-60 grams carbohydrates and snacks to under 15 grams. She will continue Novolog Flexpen 1 unit per 10 grams carbohydrates plus low dose sliding scale" Dietician consulted for diabetic diet education.  diabetic RN input appreciated.    UTI/Urine Retention/yeast cystitis -foley placed in ED, she has had urine retention problems in past due to opioids --01/22/16 UA--TNTC WBC -Urine culture + yeast, Start diflucan, continue abx to finish total of 5 days treatment, voiding trial on 1/5, avoid narcotics     Chronic Abdominal pain -likely gastroparesis related to her DM  -she  Also has cystitis, urine culture + yeast -abd pain very similar character to numerous prior admits -11/02/2015 CT abdomen pelvis--stable hepatic masses, normal pancreas, normal bowel wall thickening or inflammation -04/21/2015 CT abdomen and pelvis--stable hepatic masses, normal pancreas, normal  bowel wall thickening or inflammation, normal spleen and pancreas -09/23/2014 EGD--diffuse gastritis without clear explanation of the patient's hematemesis -Continue IV fluid, metoclopramide -minimize opioids  -she has narcotic seeking behavior -scheduled zofran q 6hrs -continue PPI  Hematemesis -resolved since her recent admission -Hemoglobin appears to be stable, likely secondary to forceful vomiting, 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 -Patient had same complaint on numerous previous admissions -09/23/2014 EGD--diffuse gastritis without clear explanation of the patient's hematemesis -pregnancy test negative, uds + marijuan in past, uds was no done this admission -Baseline hemoglobin 11-12, -serial CBC   Asthma -No wheezing or stridor -Stable on room air -Continue LABA -continue singulair  Sinus tachycardia/Dehydration -Secondary to stress response from the patient's volume depletion, vomiting, and pain -As discussed, aggressive fluid resuscitation and conservative measures usually results in an improvement -Echocardiogram 05/24/2014 showed EF Q000111Q, grade 1 diastolic dysfunction -A999333 -improving  Hypokalemia/Hypomagnesemia -replete    Code Status: full  Family Communication: patient   Disposition Plan: out of  stepdown to med tele, anticipate discharge on 1/6   Consultants:  Diabetes RN  Dietician   Procedures:  none  Antibiotics:  Rocephin and diflucan IV  Then augmentin and diflucan orally from 1/5.   Objective: BP 121/84 (BP Location: Left Arm)   Pulse 96   Temp 98.4 F (36.9 C) (Oral)   Resp (!) 28   Ht 5\' 4"  (1.626 m)   Wt 52.8 kg (116 lb 6.5 oz)   LMP  (LMP Unknown)   SpO2 100%   BMI 19.98 kg/m   Intake/Output Summary (Last 24 hours) at 01/24/16 0851 Last data filed at 01/24/16 0600  Gross per 24 hour  Intake  1563.94 ml  Output             3835 ml  Net         -2271.06 ml   Filed Weights    01/22/16 0948 01/22/16 1100  Weight: 52.2 kg (115 lb) 52.8 kg (116 lb 6.5 oz)    Exam:   General:  Much better  Cardiovascular: less sinus tachycardia   Respiratory: CTABL  Abdomen: epigastric tenderness resolved, no guarding, no rebound, positive BS, foley removed.  Musculoskeletal: No Edema  Neuro: aaox3  Data Reviewed: Basic Metabolic Panel:  Recent Labs Lab 01/22/16 0632  01/23/16 0240 01/23/16 0640 01/23/16 1012 01/23/16 1415 01/23/16 1818 01/24/16 0325  NA 135  < > 136 138 135 136 137 139  K 2.8*  < > 3.5 3.5 3.0* 3.2* 3.2* 3.4*  CL 103  < > 112* 108 109 105 106 106  CO2 16*  < > 18* 18* 18* 20* 23 22  GLUCOSE 141*  < > 114* 255* 162* 152* 199* 229*  BUN 20  < > 9 7 <5* <5* <5* <5*  CREATININE 0.75  < > <0.30* 0.43* 0.45 0.45 0.45 0.45  CALCIUM 8.6*  < > 7.7* 8.2* 8.1* 8.2* 8.3* 8.3*  MG 1.6*  --  1.5*  --   --   --   --  1.3*  < > = values in this interval not displayed. Liver Function Tests: No results for input(s): AST, ALT, ALKPHOS, BILITOT, PROT, ALBUMIN in the last 168 hours. No results for input(s): LIPASE, AMYLASE in the last 168 hours. No results for input(s): AMMONIA in the last 168 hours. CBC:  Recent Labs Lab 01/18/16 0201 01/22/16 0329 01/22/16 0632  WBC 9.4  --  7.3  NEUTROABS  --   --  3.9  HGB 12.1 15.3* 12.7  HCT 36.2 45.0 36.9  MCV 88.3  --  84.6  PLT 398  --  428*   Cardiac Enzymes:   No results for input(s): CKTOTAL, CKMB, CKMBINDEX, TROPONINI in the last 168 hours. BNP (last 3 results)  Recent Labs  04/11/15 0325 07/11/15 0353  BNP 43.5 6.9    ProBNP (last 3 results) No results for input(s): PROBNP in the last 8760 hours.  CBG:  Recent Labs Lab 01/23/16 2118 01/23/16 2234 01/23/16 2337 01/24/16 0034 01/24/16 0728  GLUCAP 123* 210* 196* 139* 143*    Recent Results (from the past 240 hour(s))  Culture, Urine     Status: Abnormal   Collection Time: 01/17/16  7:03 AM  Result Value Ref Range Status    Specimen Description URINE, CLEAN CATCH  Final   Special Requests NONE  Final   Culture MULTIPLE SPECIES PRESENT, SUGGEST RECOLLECTION (A)  Final   Report Status 01/18/2016 FINAL  Final  MRSA PCR Screening     Status: None   Collection Time: 01/17/16  9:02 AM  Result Value Ref Range Status   MRSA by PCR NEGATIVE NEGATIVE Final    Comment:        The GeneXpert MRSA Assay (FDA approved for NASAL specimens only), is one component of a comprehensive MRSA colonization surveillance program. It is not intended to diagnose MRSA infection nor to guide or monitor treatment for MRSA infections.   Urine culture     Status: Abnormal   Collection Time: 01/22/16  3:15 AM  Result Value Ref Range Status   Specimen Description URINE, CLEAN CATCH  Final   Special Requests NONE  Final   Culture >=  100,000 COLONIES/mL YEAST (A)  Final   Report Status 01/23/2016 FINAL  Final     Studies: No results found.  Scheduled Meds: . cefTRIAXone (ROCEPHIN)  IV  1 g Intravenous Q24H  . enoxaparin (LOVENOX) injection  40 mg Subcutaneous Q24H  . escitalopram  5 mg Oral Daily  . fluconazole (DIFLUCAN) IV  100 mg Intravenous Q24H  . HYDROcodone-acetaminophen  1 tablet Oral Once  . insulin aspart  0-9 Units Subcutaneous TID WC  . insulin glargine  20 Units Subcutaneous Q24H  . magnesium sulfate 1 - 4 g bolus IVPB  4 g Intravenous Once  . metoCLOPramide  10 mg Oral TID AC  . mometasone-formoterol  2 puff Inhalation BID  . montelukast  10 mg Oral Daily  . pantoprazole (PROTONIX) IV  40 mg Intravenous Q12H  . potassium chloride SA  20 mEq Oral Daily  . tamsulosin  0.4 mg Oral Daily  . Vitamin D (Ergocalciferol)  50,000 Units Oral Q7 days    Continuous Infusions:    Time spent: 33mins  Deadrian Toya MD, PhD  [facility] Pager [phone]. If 7PM-7AM, please contact night-coverage at [email or web address], password [facility] 01/24/2016, 8:51 AM  LOS: 1 day

## 2016-01-24 NOTE — Progress Notes (Signed)
PHARMACIST - PHYSICIAN COMMUNICATION  DR:  Erlinda Hong  CONCERNING: IV to Oral Route Change Policy  RECOMMENDATION: This patient is receiving Protonix by the intravenous route.  Based on criteria approved by the Pharmacy and Therapeutics Committee, the intravenous medication(s) is/are being converted to the equivalent oral dose form(s).   DESCRIPTION: These criteria include:  The patient is eating (either orally or via tube) and/or has been taking other orally administered medications for a least 24 hours  The patient has no evidence of active gastrointestinal bleeding or impaired GI absorption (gastrectomy, short bowel, patient on TNA or NPO).  If you have questions about this conversion, please contact the Pharmacy Department  []   [phone] )  [facility] []   [phone] )  [facility] []   [phone] )  [facility] []   [phone] )  [facility] [x]   [phone] )  [facility], [place], [facility] 01/24/2016 10:12 AM

## 2016-01-25 DIAGNOSIS — N390 Urinary tract infection, site not specified: Secondary | ICD-10-CM

## 2016-01-25 LAB — BASIC METABOLIC PANEL
ANION GAP: 9 (ref 5–15)
BUN: 5 mg/dL — ABNORMAL LOW (ref 6–20)
CALCIUM: 8.1 mg/dL — AB (ref 8.9–10.3)
CHLORIDE: 104 mmol/L (ref 101–111)
CO2: 23 mmol/L (ref 22–32)
Creatinine, Ser: 0.54 mg/dL (ref 0.44–1.00)
GFR calc non Af Amer: >60 mL/min (ref >60)
GLUCOSE: 244 mg/dL — AB (ref 65–99)
POTASSIUM: 3.7 mmol/L (ref 3.5–5.1)
Sodium: 136 mmol/L (ref 135–145)

## 2016-01-25 LAB — GLUCOSE, CAPILLARY
GLUCOSE-CAPILLARY: 178 mg/dL — AB (ref 65–99)
GLUCOSE-CAPILLARY: 236 mg/dL — AB (ref 65–99)

## 2016-01-25 LAB — CBC
HCT: 29.9 % — ABNORMAL LOW (ref 36.0–46.0)
HEMOGLOBIN: 10 g/dL — AB (ref 12.0–15.0)
MCH: 29.2 pg (ref 26.0–34.0)
MCHC: 33.4 g/dL (ref 30.0–36.0)
MCV: 87.4 fL (ref 78.0–100.0)
PLATELETS: 303 10*3/uL (ref 150–400)
RBC: 3.42 MIL/uL — AB (ref 3.87–5.11)
RDW: 13.1 % (ref 11.5–15.5)
WBC: 5 10*3/uL (ref 4.0–10.5)

## 2016-01-25 LAB — MAGNESIUM: Magnesium: 1.6 mg/dL — ABNORMAL LOW (ref 1.7–2.4)

## 2016-01-25 MED ORDER — FLUCONAZOLE 100 MG PO TABS: 100.0000 mg | ORAL_TABLET | Freq: Every day | ORAL | 0 refills | Status: DC

## 2016-01-25 MED ORDER — FAMOTIDINE 20 MG PO TABS: 20.0000 mg | ORAL_TABLET | Freq: Every day | ORAL | 0 refills | Status: DC

## 2016-01-25 MED ORDER — MAGNESIUM OXIDE 400 MG PO TABS: 400.0000 mg | ORAL_TABLET | Freq: Every day | ORAL | 0 refills | Status: DC

## 2016-01-25 MED ORDER — FLUCONAZOLE 100 MG PO TABS: 100.0000 mg | ORAL_TABLET | Freq: Every day | ORAL | 0 refills | Status: AC

## 2016-01-25 MED ORDER — MAGNESIUM SULFATE 4 GM/100ML IV SOLN
4.0000 g | Freq: Once | INTRAVENOUS | Status: AC
  Administered 2016-01-25: 4 g via INTRAVENOUS
  Filled 2016-01-25: qty 100

## 2016-01-25 NOTE — Discharge Summary (Signed)
Discharge Summary  [name] [id number] DOB: [birth year]  PCP: Vicenta Aly, FNP  Admit date: 01/22/2016 Discharge date: 01/25/2016  Time spent: <58mins  Recommendations for Outpatient Follow-up:  1. F/u with PMD within a week  for hospital discharge follow up, repeat cbc/bmp at follow up 2. F/u with endocrinology in a week for diabetes control  Discharge Diagnoses:  Active Hospital Problems   Diagnosis Date Noted  . UTI (urinary tract infection) 01/22/2016  . Acute urinary retention   . Uncontrolled type 1 diabetes mellitus with hyperglycemia ([place])   . Hypokalemia 11/26/2014  . Hypomagnesemia 11/25/2014  . DKA, type 1 ([place]) 11/24/2014    Resolved Hospital Problems   Diagnosis Date Noted Date Resolved  No resolved problems to display.    Discharge Condition: stable  Diet recommendation: carb modified  Filed Weights   01/22/16 0948 01/22/16 1100  Weight: 52.2 kg (115 lb) 52.8 kg (116 lb 6.5 oz)    History of present illness:  Patient coming from: Home  Chief Complaint: abdominal distension  HPI:  [name] is a 26 y.o. female with medical history of diabetes mellitus type 1, recent pancreatitis, liver adenomas, anxiety, hepatic steatosis, asthma presented with 1day history of abdominal distention and abdominal pain. Initial workup in emergency department revealed urinary retention and a Foley catheter was placed with urine output of at least 600 mL. The patient continues to complain of her chronic abdominal pain without any emesis but complains of some nausea. She denied any fevers, chills, chest pain, short of breath, headache, neck pain, dysuria, hematuria. Notably, the patient was recently discharged from the hospital on 01/19/2016 after treatment for DKA.   In the emergency department, the patient was noted to have unremarkable CBC, but BMP showed an anion gap of 16 even though her serum glucose was only 141. I requested that the patient is  started on insulin drip and placed on the DKA protocol. Urinalysis showed  TNTC WBC. The patient was admitted for treatment of urinary retention, UTI, and DKA.  The patient has had numerous admissions in the past 6 months secondary to DKA as well as for N/V from presumptive gastroparesis. During her admissions, it has been characteristic for slow recovery due to slow recovering metabolic acidosis which is initially gapped and subsequently nongapped. In addition, her admissions have been characterized by profound volume depletion requiring significant fluid resuscitation which subsequently resulted in improvement in the patient's sinus tachycardia and metabolic acidosis. Finally, her previous admissions have been characterized by opioid dependence for her "abdominal pain" which has resulted in exacerbation of her presumed gastroparesis resulting in slow recovery of her nausea and vomiting. However, when the patient's opioids are decreased in dose and frequency, the patient's vomiting improves. In addition when the patient's opioids are decreased, the patient has had a propensity to leave [facility] in the past.   Hospital Course:  Active Problems:   DKA, type 1 ([place])   Hypomagnesemia   Hypokalemia   Uncontrolled type 1 diabetes mellitus with hyperglycemia ([place])   Acute urinary retention   UTI (urinary tract infection)  DKA in DM type 1, recurrent, frequent hospitalization, she was discharged a few days ago -patient is admitted to stepdown unit and  started on IV insulin drip with q 1 hour CBG check and q 4 hour BMPs -pt started on aggressive fluid resuscitation  -Electrolytes were monitored and repleted -better, transitioned to [place] insulin on 1/5-started  insulin glargine 25 units daily, meal coverage 6units, ssi -11/10/2015 HbA1C--10.3  Per her endocrinology recommendation: "strongly recommended keeping meals to 45-60 grams carbohydrates and snacks to under 15 grams. She will  continue Novolog Flexpen 1 unit per 10 grams carbohydrates plus low dose sliding scale" Dietician consulted for diabetic diet education.  diabetic RN input appreciated.  Patient report having hypoglycemia episodes in the middle of the night about once per months, there is no hypoglycemia episode in the hospital. She is discharged on lantus 26unit qhs, meal coverage with novolog 6units and ssi     UTI/Urine Retention/yeast cystitis -foley placed in ED, she has had urine retention problems in past due to opioids --01/22/16 UA--TNTC WBC -Urine culture + yeast,  -she is on flomax in the hospital,  -Start diflucan, voided after foley removal on 1/5, avoid narcotics    Chronic Abdominal pain -likely gastroparesis related to her DM  -she  Also has cystitis, urine culture + yeast -abd pain very similar character to numerous prior admits -11/02/2015 CT abdomen pelvis--stable hepatic masses, normal pancreas, normal bowel wall thickening or inflammation -04/21/2015 CT abdomen and pelvis--stable hepatic masses, normal pancreas, normal bowel wall thickening or inflammation, normal spleen and pancreas -09/23/2014 EGD--diffuse gastritis without clear explanation of the patient's hematemesis -Continue IV fluid, metoclopramide -minimize opioids  -she has narcotic seeking behavior -scheduled zofran q 6hrs -continue PPI -no pain,  Tolerate regular diet  at discharge  Hematemesis -likely secondary to forceful vomiting, ? mallory-weiss syndrome -Patient had same complaint on numerous previous admissions -09/23/2014 EGD--diffuse gastritis without clear explanation of the patient's hematemesis -pregnancy test negative, uds + marijuan in past, uds was no done this admission -Baseline hemoglobin 11-12, -serial CBC No n/v at discharge, no abdominal pain , no epigastric pain, tolerating regular diet ,she is on protonix in the hospital ,discharged on pepcid.   Asthma -No wheezing or  stridor -Stable on room air -Continue LABA -continue singulair  Sinus tachycardia/Dehydration -Secondary to stress response from the patient's volume depletion, vomiting, and pain -As discussed, aggressive fluid resuscitation and conservative measures usually results in an improvement -Echocardiogram 05/24/2014 showed EF [id number], grade 1 diastolic dysfunction -[id number] -sinus tachycardia resolved, she is RRR at discharge  Hypokalemia/Hypomagnesemia -replete, d/c home on oral k and mag supplement    Code Status: full  Family Communication: patient   Disposition Plan:  discharge home on 1/6   Consultants:  Diabetes RN  Dietician   Procedures:  none  Antibiotics:  Rocephin and diflucan IV  Then augmentin and diflucan orally from 1/5.    Discharge Exam: BP 126/89 (BP Location: Left Arm)   Pulse 91   Temp 98 F (36.7 C) (Oral)   Resp 18   Ht 5\' 4"  (1.626 m)   Wt 52.8 kg (116 lb 6.5 oz)   LMP  (LMP Unknown)   SpO2 100%   BMI 19.98 kg/m   General: * Cardiovascular: * Respiratory: *  Discharge Instructions You were cared for by a hospitalist during your hospital stay. If you have any questions about your discharge medications or the care you received while you were in the hospital after you are discharged, you can call the unit and asked to speak with the hospitalist on call if the hospitalist that took care of you is not available. Once you are discharged, your primary care physician will handle any further medical issues. Please note that NO REFILLS for any discharge medications will be authorized once you are discharged, as it is imperative that you return to your primary care physician (or establish a relationship with  a primary care physician if you do not have one) for your aftercare needs so that they can reassess your need for medications and monitor your lab values.  Discharge Instructions    Diet Carb Modified    Complete by:  As  directed    strongly recommended keeping meals to 45-60 grams carbohydrates and snacks to under 15 grams.  continue Novolog Flexpen 1 unit per 10 grams carbohydrates plus low dose sliding scale   Increase activity slowly    Complete by:  As directed    Increase activity slowly    Complete by:  As directed      Allergies as of 01/25/2016      Reactions   Peanut-containing Drug Products Swelling   Swelling of mouth, lips   Strawberry Extract Swelling   Swelling of mouth, lips   Ultram [tramadol] Itching      Medication List    TAKE these medications   albuterol 108 (90 Base) MCG/ACT inhaler Commonly known as:  PROVENTIL HFA;VENTOLIN HFA Inhale 2 puffs into the lungs every 6 (six) hours as needed for shortness of breath.   albuterol (2.5 MG/3ML) 0.083% nebulizer solution Commonly known as:  PROVENTIL Take 2.5 mg by nebulization every 6 (six) hours as needed for shortness of breath.   BASAGLAR KWIKPEN 100 UNIT/ML Sopn Inject 0.26 mLs (26 Units total) into the skin at bedtime.   budesonide-formoterol 160-4.5 MCG/ACT inhaler Commonly known as:  SYMBICORT Inhale 2 puffs into the lungs daily.   escitalopram 10 MG tablet Commonly known as:  LEXAPRO Take 0.5 tablets (5 mg total) by mouth daily.   famotidine 20 MG tablet Commonly known as:  PEPCID Take 1 tablet (20 mg total) by mouth at bedtime.   fluconazole 100 MG tablet Commonly known as:  DIFLUCAN Take 1 tablet (100 mg total) by mouth daily.   magnesium oxide 400 MG tablet Commonly known as:  MAG-OX Take 1 tablet (400 mg total) by mouth daily.   metoCLOPramide 10 MG tablet Commonly known as:  REGLAN Take 1 tablet (10 mg total) by mouth 3 (three) times daily before meals.   montelukast 10 MG tablet Commonly known as:  SINGULAIR Take 10 mg by mouth daily.   NOVOLOG FLEXPEN 100 UNIT/ML FlexPen Generic drug:  insulin aspart 1 UNIT FOR EVERY 10 GRAMS CARB W/MEALS/SNACKS PLUS SLIDING SCALE: 1 UNIT GLUCOSE 50 ABOVE  150.TDD 20   pantoprazole 40 MG tablet Commonly known as:  PROTONIX Take 1 tablet (40 mg total) by mouth 2 (two) times daily.   potassium chloride SA 20 MEQ tablet Commonly known as:  K-DUR,KLOR-CON Take 1 tablet (20 mEq total) by mouth daily.   promethazine 25 MG tablet Commonly known as:  PHENERGAN Take 1 tablet (25 mg total) by mouth every 8 (eight) hours as needed for nausea or vomiting.   Vitamin D (Ergocalciferol) 50000 units Caps capsule Commonly known as:  DRISDOL TAKE ONE CAPSULE (50,000 UNITS TOTAL) BY MOUTH ONCE A WEEK. Saturday      Allergies  Allergen Reactions  . Peanut-Containing Drug Products Swelling    Swelling of mouth, lips  . Strawberry Extract Swelling    Swelling of mouth, lips  . Ultram [Tramadol] Itching   Follow-up Information    ANDERSON,TERESA, FNP Follow up in 1 week(s).   Specialty:  Nurse Practitioner Why:  hospital discharge follow up Contact information: [address] [place] [address] [phone]        LEVY, Rexene Agent, MD Follow  up.   Specialty:  Endocrinology Why:  recurrent DKA, a1c 10% on 12/30 Contact information: [id number] [place] [address] [phone]            The results of significant diagnostics from this hospitalization (including imaging, microbiology, ancillary and laboratory) are listed below for reference.    Significant Diagnostic Studies: Dg Abd Acute W/chest  Result Date: 01/17/2016 CLINICAL DATA:  Intractable vomiting EXAM: DG ABDOMEN ACUTE W/ 1V CHEST COMPARISON:  Abdominal radiograph November 20, 2015 FINDINGS: PA chest: No edema or consolidation. The heart size and pulmonary vascularity are normal. No adenopathy. Supine and left lateral decubitus abdomen: There is no bowel dilatation or air-fluid level suggesting bowel obstruction. No free air. There is soft tissue fullness in the pelvis, possibly a distended urinary bladder. There are surgical clips in  the right upper quadrant medially. There is a small phlebolith in the lower left pelvis. IMPRESSION: No bowel obstruction or free air. Soft tissue fullness in the pelvis. Question distended urinary bladder. If this structure does not represent a distended urinary bladder, pelvic mass must be of concern. Lungs clear. Electronically Signed   By: Lowella Grip III M.D.   On: 01/17/2016 10:43    Microbiology: Recent Results (from the past 240 hour(s))  Culture, Urine     Status: Abnormal   Collection Time: 01/17/16  7:03 AM  Result Value Ref Range Status   Specimen Description URINE, CLEAN CATCH  Final   Special Requests NONE  Final   Culture MULTIPLE SPECIES PRESENT, SUGGEST RECOLLECTION (A)  Final   Report Status 01/18/2016 FINAL  Final  MRSA PCR Screening     Status: None   Collection Time: 01/17/16  9:02 AM  Result Value Ref Range Status   MRSA by PCR NEGATIVE NEGATIVE Final    Comment:        The GeneXpert MRSA Assay (FDA approved for NASAL specimens only), is one component of a comprehensive MRSA colonization surveillance program. It is not intended to diagnose MRSA infection nor to guide or monitor treatment for MRSA infections.   Urine culture     Status: Abnormal   Collection Time: 01/22/16  3:15 AM  Result Value Ref Range Status   Specimen Description URINE, CLEAN CATCH  Final   Special Requests NONE  Final   Culture >=100,000 COLONIES/mL YEAST (A)  Final   Report Status 01/23/2016 FINAL  Final     Labs: Basic Metabolic Panel:  Recent Labs Lab 01/22/16 [id number]  01/23/16 0240  01/23/16 1012 01/23/16 1415 01/23/16 1818 01/24/16 0325 01/25/16 0312  NA 135  < > 136  < > 135 136 137 139 136  K 2.8*  < > 3.5  < > 3.0* 3.2* 3.2* 3.4* 3.7  CL 103  < > 112*  < > 109 105 106 106 104  CO2 16*  < > 18*  < > 18* 20* 23 22 23   GLUCOSE 141*  < > 114*  < > 162* 152* 199* 229* 244*  BUN 20  < > 9  < > <5* <5* <5* <5* 5*  CREATININE 0.75  < > <0.30*  < > 0.45 0.45 0.45 0.45  0.54  CALCIUM 8.6*  < > 7.7*  < > 8.1* 8.2* 8.3* 8.3* 8.1*  MG 1.6*  --  1.5*  --   --   --   --  1.3* 1.6*  < > = values in this interval not displayed. Liver Function Tests: No results  for input(s): AST, ALT, ALKPHOS, BILITOT, PROT, ALBUMIN in the last 168 hours. No results for input(s): LIPASE, AMYLASE in the last 168 hours. No results for input(s): AMMONIA in the last 168 hours. CBC:  Recent Labs Lab 01/22/16 0329 01/22/16 0632 01/25/16 0312  WBC  --  7.3 5.0  NEUTROABS  --  3.9  --   HGB 15.3* 12.7 10.0*  HCT 45.0 36.9 29.9*  MCV  --  84.6 87.4  PLT  --  428* 303   Cardiac Enzymes: No results for input(s): CKTOTAL, CKMB, CKMBINDEX, TROPONINI in the last 168 hours. BNP: BNP (last 3 results)  Recent Labs  04/11/15 0325 07/11/15 0353  BNP 43.5 6.9    ProBNP (last 3 results) No results for input(s): PROBNP in the last 8760 hours.  CBG:  Recent Labs Lab 01/24/16 0728 01/24/16 1147 01/24/16 1717 01/24/16 2142 01/25/16 0803  GLUCAP 143* 283* 257* 140* 178*       Signed:  Shemuel Harkleroad MD, PhD  [facility] 01/25/2016, 10:22 AM

## 2016-01-25 NOTE — Progress Notes (Signed)
Reviewed discharge information with patient. Answered all questions. Patient able to teach back medications and reasons to contact MD/911. Patient verbalizes importance of PCP follow up appointment.  Jeronimo Hellberg M. Denim Start, RN   

## 2016-02-05 ENCOUNTER — Emergency Department (HOSPITAL_COMMUNITY): Payer: Medicaid Other

## 2016-02-05 ENCOUNTER — Emergency Department (HOSPITAL_COMMUNITY)
Admission: EM | Admit: 2016-02-05 | Discharge: 2016-02-05 | Disposition: A | Discharge status: Home / Self Care | Payer: Medicaid Other | Attending: Emergency Medicine | Admitting: Emergency Medicine

## 2016-02-05 ENCOUNTER — Encounter (HOSPITAL_COMMUNITY): Payer: Self-pay | Admitting: Nurse Practitioner

## 2016-02-05 DIAGNOSIS — Z79899 Other long term (current) drug therapy: Secondary | ICD-10-CM | POA: Insufficient documentation

## 2016-02-05 DIAGNOSIS — R112 Nausea with vomiting, unspecified: Secondary | ICD-10-CM | POA: Diagnosis present

## 2016-02-05 DIAGNOSIS — J45909 Unspecified asthma, uncomplicated: Secondary | ICD-10-CM | POA: Insufficient documentation

## 2016-02-05 DIAGNOSIS — E1043 Type 1 diabetes mellitus with diabetic autonomic (poly)neuropathy: Secondary | ICD-10-CM | POA: Diagnosis not present

## 2016-02-05 DIAGNOSIS — I10 Essential (primary) hypertension: Secondary | ICD-10-CM | POA: Diagnosis not present

## 2016-02-05 DIAGNOSIS — E1143 Type 2 diabetes mellitus with diabetic autonomic (poly)neuropathy: Secondary | ICD-10-CM

## 2016-02-05 DIAGNOSIS — K3184 Gastroparesis: Secondary | ICD-10-CM

## 2016-02-05 DIAGNOSIS — Z91010 Allergy to peanuts: Secondary | ICD-10-CM | POA: Insufficient documentation

## 2016-02-05 LAB — CBC WITH DIFFERENTIAL/PLATELET
Basophils Absolute: 0 10*3/uL (ref 0.0–0.1)
Basophils Relative: 0 %
Eosinophils Absolute: 0 10*3/uL (ref 0.0–0.7)
Eosinophils Relative: 0 %
HEMATOCRIT: 42 % (ref 36.0–46.0)
HEMOGLOBIN: 14.2 g/dL (ref 12.0–15.0)
LYMPHS ABS: 0.9 10*3/uL (ref 0.7–4.0)
LYMPHS PCT: 9 %
MCH: 29.6 pg (ref 26.0–34.0)
MCHC: 33.8 g/dL (ref 30.0–36.0)
MCV: 87.7 fL (ref 78.0–100.0)
MONOS PCT: 4 %
Monocytes Absolute: 0.4 10*3/uL (ref 0.1–1.0)
NEUTROS ABS: 9.1 10*3/uL — AB (ref 1.7–7.7)
NEUTROS PCT: 87 %
Platelets: 379 10*3/uL (ref 150–400)
RBC: 4.79 MIL/uL (ref 3.87–5.11)
RDW: 13.5 % (ref 11.5–15.5)
WBC: 10.5 10*3/uL (ref 4.0–10.5)

## 2016-02-05 LAB — COMPREHENSIVE METABOLIC PANEL
ALK PHOS: 110 U/L (ref 38–126)
ALT: 16 U/L (ref 14–54)
ANION GAP: 17 — AB (ref 5–15)
AST: 27 U/L (ref 15–41)
Albumin: 4.9 g/dL (ref 3.5–5.0)
BUN: 26 mg/dL — ABNORMAL HIGH (ref 6–20)
CALCIUM: 10.2 mg/dL (ref 8.9–10.3)
CO2: 22 mmol/L (ref 22–32)
CREATININE: 0.76 mg/dL (ref 0.44–1.00)
Chloride: 101 mmol/L (ref 101–111)
GFR calc Af Amer: >60 mL/min (ref >60)
GFR calc non Af Amer: >60 mL/min (ref >60)
GLUCOSE: 214 mg/dL — AB (ref 65–99)
Potassium: 4.2 mmol/L (ref 3.5–5.1)
Sodium: 140 mmol/L (ref 135–145)
Total Bilirubin: 0.9 mg/dL (ref 0.3–1.2)
Total Protein: 9 g/dL — ABNORMAL HIGH (ref 6.5–8.1)

## 2016-02-05 LAB — CBG MONITORING, ED: GLUCOSE-CAPILLARY: 209 mg/dL — AB (ref 65–99)

## 2016-02-05 LAB — RAPID URINE DRUG SCREEN, HOSP PERFORMED
AMPHETAMINES: NOT DETECTED
Barbiturates: NOT DETECTED
Benzodiazepines: NOT DETECTED
Cocaine: NOT DETECTED
OPIATES: NOT DETECTED
Tetrahydrocannabinol: NOT DETECTED

## 2016-02-05 LAB — LIPASE, BLOOD: Lipase: 15 U/L (ref 11–51)

## 2016-02-05 LAB — PREGNANCY, URINE: PREG TEST UR: NEGATIVE

## 2016-02-05 MED ORDER — SODIUM CHLORIDE 0.9 % IV BOLUS (SEPSIS)
1000.0000 mL | Freq: Once | INTRAVENOUS | Status: AC
  Administered 2016-02-05: 1000 mL via INTRAVENOUS

## 2016-02-05 MED ORDER — SODIUM CHLORIDE 0.9 % IV BOLUS (SEPSIS)
1000.0000 mL | INTRAVENOUS | Status: AC
  Administered 2016-02-05: 1000 mL via INTRAVENOUS

## 2016-02-05 MED ORDER — METOCLOPRAMIDE HCL 5 MG/ML IJ SOLN
10.0000 mg | INTRAMUSCULAR | Status: AC
  Administered 2016-02-05: 10 mg via INTRAVENOUS
  Filled 2016-02-05: qty 2

## 2016-02-05 MED ORDER — DICYCLOMINE HCL 10 MG/ML IM SOLN
20.0000 mg | Freq: Once | INTRAMUSCULAR | Status: AC
  Administered 2016-02-05: 20 mg via INTRAMUSCULAR
  Filled 2016-02-05: qty 2

## 2016-02-05 MED ORDER — KETOROLAC TROMETHAMINE 30 MG/ML IJ SOLN
30.0000 mg | Freq: Once | INTRAMUSCULAR | Status: AC
  Administered 2016-02-05: 30 mg via INTRAVENOUS
  Filled 2016-02-05: qty 1

## 2016-02-05 MED ORDER — HALOPERIDOL LACTATE 5 MG/ML IJ SOLN
2.0000 mg | Freq: Once | INTRAMUSCULAR | Status: AC
  Administered 2016-02-05: 2 mg via INTRAVENOUS
  Filled 2016-02-05: qty 1

## 2016-02-05 MED ORDER — FAMOTIDINE IN NACL 20-0.9 MG/50ML-% IV SOLN
20.0000 mg | Freq: Once | INTRAVENOUS | Status: AC
  Administered 2016-02-05: 20 mg via INTRAVENOUS
  Filled 2016-02-05: qty 50

## 2016-02-05 NOTE — ED Notes (Signed)
Pt A&OX4, ambulatory at d/c with steady gait, NAD 

## 2016-02-05 NOTE — ED Notes (Signed)
Patient given sprite per fluid challenge, tolerating well.

## 2016-02-05 NOTE — ED Triage Notes (Signed)
Per EMS: Patient brought in from with nausea and vomiting. She informed them that she has gastroparesis and it is flaring up. States it happens several times a year. She is a known diabetic and her CBG 238. HR 130, NSR, 100% RA, 140/100, she was given Zofran 4mg  @ 2358, no episodes of nausea in route, but did have an episode during transport inside of hospital. She is A&O x4. She is also complaining of abdominal pain.

## 2016-02-05 NOTE — ED Notes (Signed)
Bed: WA02 Expected date:  Expected time:  Means of arrival:  Comments: EMS 26 yo female hx gastroparesis/blood streaked emesis HR 130

## 2016-02-05 NOTE — ED Provider Notes (Signed)
[facility] DEPT Provider Note   CSN: [id number] Arrival date & time: 02/05/16  0051  By signing my name below, I, Ephriam Jenkins, attest that this documentation has been prepared under the direction and in the presence of Antonietta Breach, PA-C  Electronically Signed: Ephriam Jenkins, ED Scribe. 02/05/16. 1:19 AM.   History   Chief Complaint Chief Complaint  Patient presents with  . Nausea  . Emesis   HPI HPI Comments: [name] is a 26 y.o. female, with Hx of Diabetic gastroparesis, Pancreatitis, who presents to the Emergency Department complaining of gradual onset nausea and multiple episodes of vomiting that started approximately one hour ago. Pt states that her gastroparesis is flaring up. This has happened to her several times this year. She is a known DM and is poorly controlled. Her CBG via EMS was 238. She was given Zofran 4mg  in route and also took a Reglan at home prior to calling EMS. She also complains of sharp abdominal pain during episodes of vomiting. No fever.  The history is provided by the patient. No language interpreter was used.    Past Medical History:  Diagnosis Date  . Anxiety   . Arthritis   . Asthma   . Diabetes mellitus 2007   IDDM.  poorly controlled, multiple admits with DKA  . Gallstones   . Heart murmur   . Hepatic steatosis 11/26/2014   and hepatomegaly  . Hypertension    NOT CURRENTLY ON ANY BP MED  . Liver mass 11/26/2014  . Pancreatitis, acute 11/26/2014    Patient Active Problem List   Diagnosis Date Noted  . UTI (urinary tract infection) 01/22/2016  . Depression   . Diarrhea 11/09/2015  . Acute urinary retention   . Gastroparesis due to DM ([place]) 07/10/2015  . GERD (gastroesophageal reflux disease) 07/10/2015  . Depression with anxiety 07/10/2015  . Uncontrolled type 1 diabetes mellitus with hyperglycemia ([place])   . Gastroparesis 06/22/2015  . Altered mental status 06/22/2015  . Volume depletion 06/10/2015  . Protein-calorie  malnutrition, severe 06/10/2015  . Hyperglycemia   . Intractable vomiting with nausea   . Elevated bilirubin   . Type 1 diabetes mellitus with hyperglycemia ([place]) 05/29/2015  . Hematemesis with nausea   . Intractable vomiting 05/28/2015  . Intractable nausea and vomiting 05/28/2015  . Abdominal pain in female   . Abdominal pain 05/24/2015  . Hypertension 05/24/2015  . Dehydration   . Diabetic gastroparesis ([place])   . Chronic diastolic heart failure ([place]) 04/11/2015  . Hematemesis 04/08/2015  . DKA (diabetic ketoacidoses) ([place]) 03/22/2015  . Nausea vomiting and diarrhea 03/22/2015  . Nausea & vomiting   . Diabetic ketoacidosis without coma associated with type 1 diabetes mellitus ([place])   . S/P laparoscopic cholecystectomy 02/11/2015  . Status post laparoscopic cholecystectomy 02/11/2015  . Postextubation stridor   . Nausea and vomiting 11/27/2014  . Pancreatitis, acute 11/26/2014  . Volume overload 11/26/2014  . Hypokalemia 11/26/2014  . Hepatic steatosis 11/26/2014  . Liver mass 11/26/2014  . Sepsis ([place]) 11/25/2014  . Sinus tachycardia 11/25/2014  . Hypomagnesemia 11/25/2014  . Hypophosphatemia 11/25/2014  . Elevated amylase and lipase 11/25/2014  . DKA, type 1 ([place]) 11/24/2014  . Elevated LFTs 11/24/2014  . Acute kidney injury ([place]) 11/24/2014  . Migraine headache 11/24/2014  . Asthma 06/29/2012  . Uncontrolled type 1 diabetes mellitus ([place]) 06/19/2010  . Goiter, unspecified 06/19/2010    Past Surgical History:  Procedure Laterality Date  . CHOLECYSTECTOMY N/A 02/11/2015   Procedure: LAPAROSCOPIC  CHOLECYSTECTOMY WITH INTRAOPERATIVE CHOLANGIOGRAM;  Surgeon: Greer Pickerel, MD;  Location: [facility]S;  Service: General;  Laterality: N/A;  . ESOPHAGOGASTRODUODENOSCOPY (EGD) WITH PROPOFOL Left 09/20/2014   Procedure: ESOPHAGOGASTRODUODENOSCOPY (EGD) WITH PROPOFOL;  Surgeon: Arta Silence, MD;  Location: [facility]OSCOPY;  Service: Endoscopy;  Laterality: Left;  . WISDOM TOOTH  EXTRACTION      OB History    Gravida Para Term Preterm AB Living   2 1 0 1 1 1    SAB TAB Ectopic Multiple Live Births   0 1 0 0 1       Home Medications    Prior to Admission medications   Medication Sig Start Date End Date Taking? Authorizing Provider  albuterol (PROVENTIL HFA;VENTOLIN HFA) 108 (90 BASE) MCG/ACT inhaler Inhale 2 puffs into the lungs every 6 (six) hours as needed for shortness of breath.    Historical Provider, MD  albuterol (PROVENTIL) (2.5 MG/3ML) 0.083% nebulizer solution Take 2.5 mg by nebulization every 6 (six) hours as needed for shortness of breath.    Historical Provider, MD  budesonide-formoterol (SYMBICORT) 160-4.5 MCG/ACT inhaler Inhale 2 puffs into the lungs daily.     Historical Provider, MD  escitalopram (LEXAPRO) 10 MG tablet Take 0.5 tablets (5 mg total) by mouth daily. 01/19/16   [place] Dubois, MD  famotidine (PEPCID) 20 MG tablet Take 1 tablet (20 mg total) by mouth at bedtime. 01/25/16   Florencia Reasons, MD  Insulin Glargine (BASAGLAR KWIKPEN) 100 UNIT/ML SOPN Inject 0.26 mLs (26 Units total) into the skin at bedtime. 01/19/16   [place] Dubois, MD  magnesium oxide (MAG-OX) 400 MG tablet Take 1 tablet (400 mg total) by mouth daily. 01/25/16   Florencia Reasons, MD  metoCLOPramide (REGLAN) 10 MG tablet Take 1 tablet (10 mg total) by mouth 3 (three) times daily before meals. 01/19/16   [place] Dubois, MD  montelukast (SINGULAIR) 10 MG tablet Take 10 mg by mouth daily.    Historical Provider, MD  NOVOLOG FLEXPEN 100 UNIT/ML FlexPen 1 UNIT FOR EVERY 10 GRAMS CARB W/MEALS/SNACKS PLUS SLIDING SCALE: 1 UNIT GLUCOSE 50 ABOVE 150.TDD 20 12/26/15   Historical Provider, MD  pantoprazole (PROTONIX) 40 MG tablet Take 1 tablet (40 mg total) by mouth 2 (two) times daily. 01/19/16   [place] Dubois, MD  potassium chloride SA (K-DUR,KLOR-CON) 20 MEQ tablet Take 1 tablet (20 mEq total) by mouth daily. 01/19/16   [place] Dubois, MD  promethazine (PHENERGAN) 25 MG tablet Take 1 tablet (25 mg total)  by mouth every 8 (eight) hours as needed for nausea or vomiting. 01/19/16   [place] Dubois, MD  Vitamin D, Ergocalciferol, (DRISDOL) 50000 units CAPS capsule TAKE ONE CAPSULE (50,000 UNITS TOTAL) BY MOUTH ONCE A WEEK. Saturday 12/27/15   Historical Provider, MD    Family History Family History  Problem Relation Age of Onset  . Heart disease Maternal Grandmother   . Heart disease Maternal Grandfather   . Diabetes Mother   . Hyperlipidemia Mother   . Hypertension Father   . Heart disease Father   . Hypertension Paternal Grandmother   . Cancer Paternal Grandfather     Social History Social History  Substance Use Topics  . Smoking status: Never Smoker  . Smokeless tobacco: Never Used  . Alcohol use No     Allergies   Peanut-containing drug products; Strawberry extract; and Ultram [tramadol]   Review of Systems Review of Systems A complete 10 system review of systems was obtained and all systems are negative except as noted in the  HPI and PMH.    Physical Exam Updated Vital Signs BP 132/97 (BP Location: Right Arm)   Pulse (!) 126   Temp 98.3 F (36.8 C) (Oral)   Resp 21   Ht 5\' 4"  (1.626 m)   Wt 116 lb (52.6 kg)   LMP  (LMP Unknown)   SpO2 100%   BMI 19.91 kg/m   Physical Exam  Constitutional: She is oriented to person, place, and time. She appears well-developed and well-nourished. No distress.  Patient actively vomiting.  HENT:  Head: Normocephalic and atraumatic.  Eyes: Conjunctivae and EOM are normal. No scleral icterus.  Neck: Normal range of motion.  Cardiovascular: Regular rhythm and intact distal pulses.   Tachycardia  Pulmonary/Chest: Effort normal. No respiratory distress. She has no wheezes.  Respirations even and unlabored  Abdominal: Soft. She exhibits no distension and no mass.  Generalized TTP. No distension or rigidity.  Musculoskeletal: Normal range of motion.  Neurological: She is alert and oriented to person, place, and time. She exhibits  normal muscle tone. Coordination normal.  GCS 15. Patient moving all extremities.  Skin: Skin is warm and dry. No rash noted. She is not diaphoretic. No erythema. No pallor.  Psychiatric: She has a normal mood and affect. Her behavior is normal.  Nursing note and vitals reviewed.    ED Treatments / Results  DIAGNOSTIC STUDIES: Oxygen Saturation is 100% on RA, normal by my interpretation.  COORDINATION OF CARE: 1:18 AM-Discussed treatment plan with pt at bedside and pt agreed to plan.   Labs (all labs ordered are listed, but only abnormal results are displayed) Labs Reviewed  CBG MONITORING, ED - Abnormal; Notable for the following:       Result Value   Glucose-Capillary 209 (*)    All other components within normal limits  RAPID URINE DRUG SCREEN, HOSP PERFORMED  CBC WITH DIFFERENTIAL/PLATELET  COMPREHENSIVE METABOLIC PANEL  LIPASE, BLOOD    EKG  EKG Interpretation None       Radiology No results found.  Procedures Procedures (including critical care time)  Medications Ordered in ED Medications - No data to display   Initial Impression / Assessment and Plan / ED Course  I have reviewed the triage vital signs and the nursing notes.  Pertinent labs & imaging results that were available during my care of the patient were reviewed by me and considered in my medical decision making (see chart for details).  Clinical Course     4:00 AM Patient reassessed. She is resting comfortably. No c/o nausea or abdominal pain. Mild hyperglycemia. No significant anion gap. No leukocytosis. Will continue to monitor. Additional IVF ordered.  5:30 AM Patient has tolerated sprite without emesis. No c/o nausea or pain. Patient is still tachycardic to 120bpm. She is not typically tachycardic on hospital discharge. Second liter of IVF infusion. Will allow for completion of fluids prior to discharge.  6:00 AM Patient signed out to oncoming midlevel provider who will discharge if  tachycardia improves after fluids.   Final Clinical Impressions(s) / ED Diagnoses   Final diagnoses:  Intractable nausea and vomiting  Gastroparesis due to DM [facility])    New Prescriptions New Prescriptions   No medications on file    I personally performed the services described in this documentation, which was scribed in my presence. The recorded information has been reviewed and is accurate.       Antonietta Breach, PA-C 02/05/16 [id number]    Veryl Speak, MD 02/05/16 [phone]

## 2016-02-05 NOTE — ED Provider Notes (Signed)
6:05 AM Patient signed out to me at change of shift by Antonietta Breach, PA-C.  Pt with hx gastroparesis, presents with same.  Pain and N/V currently controlled, pt remains tachycardic.  Plan for continued IVF, anticipate tachycardia to resolve and pt continue tolerate PO.  Anticipate d/c home.   6:35 AM Pt reports she is feeling much better, resting comfortably.  Denies any pain.  Is tolerating PO.  HR 117 on monitor.  Will give additional L IVF.     HR much improved after second liter.  D/C home.     [place], PA-C 02/05/16 [place], MD 02/06/16 [phone]

## 2016-02-05 NOTE — ED Notes (Signed)
Pt aware that a urine sample is needed. Pt will alert staff when she is able to go to the bathroom.

## 2016-02-05 NOTE — Discharge Instructions (Signed)
Read the information below.  You may return to the Emergency Department at any time for worsening condition or any new symptoms that concern you.  If you develop high fevers, worsening abdominal pain, uncontrolled vomiting, or are unable to tolerate fluids by mouth, return to the ER for a recheck.   °

## 2016-02-20 ENCOUNTER — Inpatient Hospital Stay (HOSPITAL_COMMUNITY)
Admission: EM | Admit: 2016-02-20 | Discharge: 2016-02-25 | DRG: 638 | Disposition: A | Discharge status: Home / Self Care | Payer: Medicaid Other | Attending: Internal Medicine | Admitting: Internal Medicine

## 2016-02-20 ENCOUNTER — Emergency Department (HOSPITAL_COMMUNITY): Payer: Medicaid Other

## 2016-02-20 ENCOUNTER — Observation Stay (HOSPITAL_COMMUNITY): Payer: Medicaid Other

## 2016-02-20 ENCOUNTER — Encounter (HOSPITAL_COMMUNITY): Payer: Self-pay | Admitting: Family Medicine

## 2016-02-20 DIAGNOSIS — R339 Retention of urine, unspecified: Secondary | ICD-10-CM | POA: Diagnosis present

## 2016-02-20 DIAGNOSIS — R109 Unspecified abdominal pain: Secondary | ICD-10-CM | POA: Diagnosis present

## 2016-02-20 DIAGNOSIS — Z9049 Acquired absence of other specified parts of digestive tract: Secondary | ICD-10-CM

## 2016-02-20 DIAGNOSIS — N1330 Unspecified hydronephrosis: Secondary | ICD-10-CM | POA: Diagnosis present

## 2016-02-20 DIAGNOSIS — J45909 Unspecified asthma, uncomplicated: Secondary | ICD-10-CM | POA: Diagnosis present

## 2016-02-20 DIAGNOSIS — Z833 Family history of diabetes mellitus: Secondary | ICD-10-CM

## 2016-02-20 DIAGNOSIS — R197 Diarrhea, unspecified: Secondary | ICD-10-CM

## 2016-02-20 DIAGNOSIS — K219 Gastro-esophageal reflux disease without esophagitis: Secondary | ICD-10-CM | POA: Diagnosis present

## 2016-02-20 DIAGNOSIS — Z87440 Personal history of urinary (tract) infections: Secondary | ICD-10-CM

## 2016-02-20 DIAGNOSIS — N319 Neuromuscular dysfunction of bladder, unspecified: Secondary | ICD-10-CM | POA: Diagnosis present

## 2016-02-20 DIAGNOSIS — E860 Dehydration: Secondary | ICD-10-CM | POA: Diagnosis present

## 2016-02-20 DIAGNOSIS — R112 Nausea with vomiting, unspecified: Secondary | ICD-10-CM | POA: Diagnosis present

## 2016-02-20 DIAGNOSIS — R000 Tachycardia, unspecified: Secondary | ICD-10-CM | POA: Diagnosis present

## 2016-02-20 DIAGNOSIS — E0810 Diabetes mellitus due to underlying condition with ketoacidosis without coma: Secondary | ICD-10-CM

## 2016-02-20 DIAGNOSIS — K920 Hematemesis: Secondary | ICD-10-CM | POA: Diagnosis present

## 2016-02-20 DIAGNOSIS — Z8249 Family history of ischemic heart disease and other diseases of the circulatory system: Secondary | ICD-10-CM

## 2016-02-20 DIAGNOSIS — I10 Essential (primary) hypertension: Secondary | ICD-10-CM | POA: Diagnosis present

## 2016-02-20 DIAGNOSIS — E1010 Type 1 diabetes mellitus with ketoacidosis without coma: Secondary | ICD-10-CM

## 2016-02-20 DIAGNOSIS — B3749 Other urogenital candidiasis: Secondary | ICD-10-CM | POA: Diagnosis present

## 2016-02-20 DIAGNOSIS — Z794 Long term (current) use of insulin: Secondary | ICD-10-CM

## 2016-02-20 DIAGNOSIS — E1110 Type 2 diabetes mellitus with ketoacidosis without coma: Secondary | ICD-10-CM | POA: Diagnosis present

## 2016-02-20 LAB — BASIC METABOLIC PANEL
ANION GAP: 13 (ref 5–15)
Anion gap: 6 (ref 5–15)
BUN: 15 mg/dL (ref 6–20)
BUN: 10 mg/dL (ref 6–20)
CHLORIDE: 111 mmol/L (ref 101–111)
CHLORIDE: 116 mmol/L — AB (ref 101–111)
CO2: 15 mmol/L — ABNORMAL LOW (ref 22–32)
CO2: 16 mmol/L — ABNORMAL LOW (ref 22–32)
CREATININE: 0.56 mg/dL (ref 0.44–1.00)
Calcium: 8.5 mg/dL — ABNORMAL LOW (ref 8.9–10.3)
Calcium: 7.4 mg/dL — ABNORMAL LOW (ref 8.9–10.3)
Creatinine, Ser: 0.72 mg/dL (ref 0.44–1.00)
GFR calc Af Amer: >60 mL/min (ref >60)
GFR calc Af Amer: >60 mL/min (ref >60)
GFR calc non Af Amer: >60 mL/min (ref >60)
GLUCOSE: 126 mg/dL — AB (ref 65–99)
Glucose, Bld: 134 mg/dL — ABNORMAL HIGH (ref 65–99)
POTASSIUM: 4 mmol/L (ref 3.5–5.1)
Potassium: 4 mmol/L (ref 3.5–5.1)
Sodium: 139 mmol/L (ref 135–145)
Sodium: 138 mmol/L (ref 135–145)

## 2016-02-20 LAB — URINALYSIS, ROUTINE W REFLEX MICROSCOPIC
BACTERIA UA: NONE SEEN
Bilirubin Urine: NEGATIVE
Glucose, UA: >=500 mg/dL — AB
Ketones, ur: 80 mg/dL — AB
Nitrite: NEGATIVE
PROTEIN: 100 mg/dL — AB
Specific Gravity, Urine: 1.023 (ref 1.005–1.030)
pH: 5 (ref 5.0–8.0)

## 2016-02-20 LAB — INFLUENZA PANEL BY PCR (TYPE A & B)
INFLAPCR: NEGATIVE
INFLBPCR: NEGATIVE

## 2016-02-20 LAB — TYPE AND SCREEN
ABO/RH(D): O POS
Antibody Screen: NEGATIVE

## 2016-02-20 LAB — COMPREHENSIVE METABOLIC PANEL
ALBUMIN: 4.8 g/dL (ref 3.5–5.0)
ALK PHOS: 128 U/L — AB (ref 38–126)
ALT: 18 U/L (ref 14–54)
ANION GAP: 24 — AB (ref 5–15)
AST: 25 U/L (ref 15–41)
BUN: 18 mg/dL (ref 6–20)
CALCIUM: 9.7 mg/dL (ref 8.9–10.3)
CO2: 10 mmol/L — ABNORMAL LOW (ref 22–32)
Chloride: 102 mmol/L (ref 101–111)
Creatinine, Ser: 0.95 mg/dL (ref 0.44–1.00)
GFR calc Af Amer: >60 mL/min (ref >60)
GFR calc non Af Amer: >60 mL/min (ref >60)
GLUCOSE: 543 mg/dL — AB (ref 65–99)
Potassium: 4.9 mmol/L (ref 3.5–5.1)
SODIUM: 136 mmol/L (ref 135–145)
Total Bilirubin: 2.3 mg/dL — ABNORMAL HIGH (ref 0.3–1.2)
Total Protein: 9.1 g/dL — ABNORMAL HIGH (ref 6.5–8.1)

## 2016-02-20 LAB — CBC WITH DIFFERENTIAL/PLATELET
Basophils Absolute: 0 10*3/uL (ref 0.0–0.1)
Basophils Relative: 0 %
EOS ABS: 0 10*3/uL (ref 0.0–0.7)
Eosinophils Relative: 0 %
HCT: 42.9 % (ref 36.0–46.0)
HEMOGLOBIN: 13.3 g/dL (ref 12.0–15.0)
LYMPHS ABS: 0.9 10*3/uL (ref 0.7–4.0)
Lymphocytes Relative: 7 %
MCH: 28.3 pg (ref 26.0–34.0)
MCHC: 31 g/dL (ref 30.0–36.0)
MCV: 91.3 fL (ref 78.0–100.0)
MONOS PCT: 3 %
Monocytes Absolute: 0.3 10*3/uL (ref 0.1–1.0)
NEUTROS PCT: 90 %
Neutro Abs: 11.2 10*3/uL — ABNORMAL HIGH (ref 1.7–7.7)
Platelets: 567 10*3/uL — ABNORMAL HIGH (ref 150–400)
RBC: 4.7 MIL/uL (ref 3.87–5.11)
RDW: 14.3 % (ref 11.5–15.5)
WBC: 12.4 10*3/uL — ABNORMAL HIGH (ref 4.0–10.5)

## 2016-02-20 LAB — CBG MONITORING, ED
GLUCOSE-CAPILLARY: 308 mg/dL — AB (ref 65–99)
GLUCOSE-CAPILLARY: 235 mg/dL — AB (ref 65–99)
Glucose-Capillary: 507 mg/dL (ref 65–99)
Glucose-Capillary: 380 mg/dL — ABNORMAL HIGH (ref 65–99)
Glucose-Capillary: 225 mg/dL — ABNORMAL HIGH (ref 65–99)
Glucose-Capillary: 140 mg/dL — ABNORMAL HIGH (ref 65–99)
Glucose-Capillary: 150 mg/dL — ABNORMAL HIGH (ref 65–99)
Glucose-Capillary: 153 mg/dL — ABNORMAL HIGH (ref 65–99)

## 2016-02-20 LAB — HEMOGLOBIN AND HEMATOCRIT, BLOOD
HCT: 34.8 % — ABNORMAL LOW (ref 36.0–46.0)
Hemoglobin: 11.6 g/dL — ABNORMAL LOW (ref 12.0–15.0)

## 2016-02-20 LAB — GLUCOSE, CAPILLARY
GLUCOSE-CAPILLARY: 100 mg/dL — AB (ref 65–99)
GLUCOSE-CAPILLARY: 170 mg/dL — AB (ref 65–99)
GLUCOSE-CAPILLARY: 150 mg/dL — AB (ref 65–99)
Glucose-Capillary: 96 mg/dL (ref 65–99)
Glucose-Capillary: 193 mg/dL — ABNORMAL HIGH (ref 65–99)
Glucose-Capillary: 141 mg/dL — ABNORMAL HIGH (ref 65–99)
Glucose-Capillary: 153 mg/dL — ABNORMAL HIGH (ref 65–99)

## 2016-02-20 LAB — BLOOD GAS, VENOUS
Acid-base deficit: 11.2 mmol/L — ABNORMAL HIGH (ref 0.0–2.0)
BICARBONATE: 14.2 mmol/L — AB (ref 20.0–28.0)
FIO2: 21
O2 Saturation: 88.7 %
PCO2 VEN: 31.3 mmHg — AB (ref 44.0–60.0)
PH VEN: 7.279 (ref 7.250–7.430)
PO2 VEN: 59.8 mmHg — AB (ref 32.0–45.0)
Patient temperature: 98.6

## 2016-02-20 LAB — LIPASE, BLOOD: Lipase: 10 U/L — ABNORMAL LOW (ref 11–51)

## 2016-02-20 LAB — I-STAT BETA HCG BLOOD, ED (MC, WL, AP ONLY)

## 2016-02-20 LAB — MRSA PCR SCREENING: MRSA by PCR: NEGATIVE

## 2016-02-20 LAB — MAGNESIUM: MAGNESIUM: 2.1 mg/dL (ref 1.7–2.4)

## 2016-02-20 MED ORDER — POTASSIUM CHLORIDE 10 MEQ/100ML IV SOLN
10.0000 meq | INTRAVENOUS | Status: AC
  Administered 2016-02-20 (×3): 10 meq via INTRAVENOUS
  Filled 2016-02-20 (×3): qty 100

## 2016-02-20 MED ORDER — METOCLOPRAMIDE HCL 5 MG/ML IJ SOLN
10.0000 mg | Freq: Once | INTRAMUSCULAR | Status: AC
  Administered 2016-02-20: 10 mg via INTRAVENOUS
  Filled 2016-02-20: qty 2

## 2016-02-20 MED ORDER — SODIUM CHLORIDE 0.9 % IV SOLN: INTRAVENOUS | Status: DC

## 2016-02-20 MED ORDER — DEXTROSE 5 % IV SOLN
1.0000 g | INTRAVENOUS | Status: DC
  Administered 2016-02-20 – 2016-02-23 (×4): 1 g via INTRAVENOUS
  Filled 2016-02-20 (×4): qty 10

## 2016-02-20 MED ORDER — DEXTROSE 50 % IV SOLN: 25.0000 mL | INTRAVENOUS | Status: DC | PRN

## 2016-02-20 MED ORDER — DIPHENHYDRAMINE HCL 50 MG/ML IJ SOLN
25.0000 mg | Freq: Once | INTRAMUSCULAR | Status: AC
  Administered 2016-02-20: 25 mg via INTRAVENOUS
  Filled 2016-02-20: qty 1

## 2016-02-20 MED ORDER — PROMETHAZINE HCL 25 MG/ML IJ SOLN
12.5000 mg | Freq: Four times a day (QID) | INTRAMUSCULAR | Status: DC | PRN
  Administered 2016-02-20 – 2016-02-23 (×8): 12.5 mg via INTRAVENOUS
  Filled 2016-02-20 (×8): qty 1

## 2016-02-20 MED ORDER — MOMETASONE FURO-FORMOTEROL FUM 200-5 MCG/ACT IN AERO
2.0000 | INHALATION_SPRAY | Freq: Two times a day (BID) | RESPIRATORY_TRACT | Status: DC
  Filled 2016-02-20: qty 8.8

## 2016-02-20 MED ORDER — SODIUM CHLORIDE 0.9 % IV SOLN
8.0000 mg/h | INTRAVENOUS | Status: AC
  Administered 2016-02-20 – 2016-02-22 (×7): 8 mg/h via INTRAVENOUS
  Filled 2016-02-20 (×14): qty 80

## 2016-02-20 MED ORDER — SODIUM CHLORIDE 0.9 % IV SOLN: INTRAVENOUS | Status: AC

## 2016-02-20 MED ORDER — SODIUM CHLORIDE 0.9 % IV BOLUS (SEPSIS)
1000.0000 mL | INTRAVENOUS | Status: AC
  Administered 2016-02-20 (×3): 1000 mL via INTRAVENOUS

## 2016-02-20 MED ORDER — SODIUM CHLORIDE 0.9 % IV SOLN: 30.0000 meq | Freq: Once | INTRAVENOUS | Status: DC

## 2016-02-20 MED ORDER — DEXTROSE-NACL 5-0.45 % IV SOLN
INTRAVENOUS | Status: DC
  Administered 2016-02-20 – 2016-02-22 (×5): via INTRAVENOUS

## 2016-02-20 MED ORDER — SODIUM CHLORIDE 0.9 % IV SOLN
INTRAVENOUS | Status: DC
  Administered 2016-02-20: 4.5 [IU]/h via INTRAVENOUS
  Filled 2016-02-20: qty 2.5

## 2016-02-20 MED ORDER — DEXTROSE-NACL 5-0.45 % IV SOLN: INTRAVENOUS | Status: DC

## 2016-02-20 MED ORDER — IOPAMIDOL (ISOVUE-300) INJECTION 61%
INTRAVENOUS | Status: AC
  Filled 2016-02-20: qty 30

## 2016-02-20 MED ORDER — MORPHINE SULFATE (PF) 2 MG/ML IV SOLN
2.0000 mg | INTRAVENOUS | Status: DC | PRN
  Administered 2016-02-20 – 2016-02-24 (×21): 2 mg via INTRAVENOUS
  Filled 2016-02-20 (×22): qty 1

## 2016-02-20 MED ORDER — SODIUM CHLORIDE 0.9 % IV BOLUS (SEPSIS)
1000.0000 mL | Freq: Once | INTRAVENOUS | Status: AC
  Administered 2016-02-20: 1000 mL via INTRAVENOUS

## 2016-02-20 MED ORDER — ESCITALOPRAM OXALATE 10 MG PO TABS
5.0000 mg | ORAL_TABLET | Freq: Every day | ORAL | Status: DC
  Administered 2016-02-23 – 2016-02-25 (×3): 5 mg via ORAL
  Filled 2016-02-20 (×6): qty 1

## 2016-02-20 MED ORDER — MONTELUKAST SODIUM 10 MG PO TABS
10.0000 mg | ORAL_TABLET | Freq: Every day | ORAL | Status: DC
  Administered 2016-02-23 – 2016-02-25 (×3): 10 mg via ORAL
  Filled 2016-02-20 (×3): qty 1

## 2016-02-20 MED ORDER — SODIUM CHLORIDE 0.9 % IV SOLN
INTRAVENOUS | Status: DC
  Administered 2016-02-20: 05:00:00 via INTRAVENOUS

## 2016-02-20 MED ORDER — POTASSIUM CHLORIDE 20 MEQ/15ML (10%) PO SOLN
20.0000 meq | Freq: Every day | ORAL | Status: DC
  Administered 2016-02-20 – 2016-02-25 (×5): 20 meq via ORAL
  Filled 2016-02-20 (×5): qty 15

## 2016-02-20 MED ORDER — KETOROLAC TROMETHAMINE 15 MG/ML IJ SOLN
15.0000 mg | Freq: Once | INTRAMUSCULAR | Status: AC
  Administered 2016-02-20: 15 mg via INTRAVENOUS
  Filled 2016-02-20: qty 1

## 2016-02-20 MED ORDER — SODIUM CHLORIDE 0.9 % IV SOLN
INTRAVENOUS | Status: DC
  Administered 2016-02-20: 2.7 [IU]/h via INTRAVENOUS
  Filled 2016-02-20: qty 2.5

## 2016-02-20 MED ORDER — PANTOPRAZOLE SODIUM 40 MG IV SOLR
40.0000 mg | Freq: Once | INTRAVENOUS | Status: AC
  Administered 2016-02-20: 40 mg via INTRAVENOUS
  Filled 2016-02-20: qty 40

## 2016-02-20 MED ORDER — SODIUM CHLORIDE 0.9 % IV SOLN
INTRAVENOUS | Status: AC
  Administered 2016-02-20: 15:00:00 via INTRAVENOUS

## 2016-02-20 MED ORDER — ONDANSETRON HCL 4 MG/2ML IJ SOLN
4.0000 mg | Freq: Three times a day (TID) | INTRAMUSCULAR | Status: AC | PRN
  Administered 2016-02-20 – 2016-02-21 (×2): 4 mg via INTRAVENOUS
  Filled 2016-02-20 (×2): qty 2

## 2016-02-20 MED ORDER — INSULIN REGULAR BOLUS VIA INFUSION
0.0000 [IU] | Freq: Three times a day (TID) | INTRAVENOUS | Status: DC
  Filled 2016-02-20: qty 10

## 2016-02-20 MED ORDER — SODIUM CHLORIDE 0.9 % IV BOLUS (SEPSIS)
1000.0000 mL | INTRAVENOUS | Status: AC
  Administered 2016-02-20: 1000 mL via INTRAVENOUS

## 2016-02-20 MED ORDER — PANTOPRAZOLE SODIUM 40 MG IV SOLR
40.0000 mg | Freq: Two times a day (BID) | INTRAVENOUS | Status: DC
  Administered 2016-02-23 – 2016-02-25 (×4): 40 mg via INTRAVENOUS
  Filled 2016-02-20 (×5): qty 40

## 2016-02-20 MED ORDER — MORPHINE SULFATE (PF) 4 MG/ML IV SOLN
4.0000 mg | Freq: Once | INTRAVENOUS | Status: AC
  Administered 2016-02-20: 4 mg via INTRAVENOUS
  Filled 2016-02-20: qty 1

## 2016-02-20 MED ORDER — FAMOTIDINE 20 MG PO TABS
20.0000 mg | ORAL_TABLET | Freq: Every day | ORAL | Status: DC
  Administered 2016-02-24: 20 mg via ORAL
  Filled 2016-02-20 (×3): qty 1

## 2016-02-20 MED ORDER — IOPAMIDOL (ISOVUE-300) INJECTION 61%
30.0000 mL | Freq: Once | INTRAVENOUS | Status: AC | PRN
  Administered 2016-02-20: 30 mL via ORAL

## 2016-02-20 NOTE — H&P (Addendum)
[facility] H&P   Patient Demographics:    [name], is a 26 y.o. female  MRN: [id number]   DOB - 1990-05-04  Admit Date - 02/20/2016  Outpatient Primary MD for the patient is Vicenta Aly, FNP  Referring MD/NP/PA: Dr Roxanne Mins  Patient coming from: Home  Chief Complaint  Patient presents with  . Abdominal Pain  . Emesis      HPI:    [name]  is a 26 y.o. female, with medical history of diabetes mellitus type 1, recent pancreatitis, liver adenomas, anxiety, hepatic steatosis, asthma , presents today with abdominal pain and coffee-ground emesis, reports she did have dental procedure yesterday, she reports nausea, vomiting, hematemesis and diarrhea over last 24 hours, had recent hospitalization for similar complaints as well, where she was found with DKA. She denies fever, chills, chest pain, dyspnea, neck pain, dysuria, polyuria or hematuria. In ED workup significant for anion gap of 24, hyperglycemia, hemoglobin stabilized at 10.3, potassium of 4.9, started on Protonix drip, insulin DKA drip, and hospitalist requested to admit.    Review of systems:    In addition to the HPI above, No Fever-chills, No Headache, No changes with Vision or hearing, No problems swallowing food or Liquids, No Chest pain, Cough or Shortness of Breath, Has abdominal pain, nausea vomiting, and coffee-ground emesis , No Blood in  or Urine, she reports coffee-ground emesis No dysuria, No new skin rashes or bruises, No new joints pains-aches,  No new weakness, tingling, numbness in any extremity, report generalized weakness and fatigue No recent weight gain or loss, No polyuria, polydypsia or polyphagia, No significant Mental Stressors.  A full 10 point Review of Systems was done, except as stated above, all other Review of Systems were negative.   With Past History of the following :      Past Medical History:  Diagnosis Date  . Anxiety   . Arthritis   . Asthma   . Diabetes mellitus 2007   IDDM.  poorly controlled, multiple admits with DKA  . Gallstones   . Heart murmur   . Hepatic steatosis 11/26/2014   and hepatomegaly  . Hypertension    NOT CURRENTLY ON ANY BP MED  . Liver mass 11/26/2014  . Pancreatitis, acute 11/26/2014      Past Surgical History:  Procedure Laterality Date  . CHOLECYSTECTOMY N/A 02/11/2015   Procedure: LAPAROSCOPIC CHOLECYSTECTOMY WITH INTRAOPERATIVE CHOLANGIOGRAM;  Surgeon: Greer Pickerel, MD;  Location: [facility] ORS;  Service: General;  Laterality: N/A;  . ESOPHAGOGASTRODUODENOSCOPY (EGD) WITH PROPOFOL Left 09/20/2014   Procedure: ESOPHAGOGASTRODUODENOSCOPY (EGD) WITH PROPOFOL;  Surgeon: Arta Silence, MD;  Location: [facility] ENDOSCOPY;  Service: Endoscopy;  Laterality: Left;  . WISDOM TOOTH EXTRACTION        Social History:     Social History  Substance Use Topics  . Smoking status: Never Smoker  . Smokeless tobacco: Never Used  . Alcohol use  No     Lives - At home  Mobility - independent     Family History :     Family History  Problem Relation Age of Onset  . Heart disease Maternal Grandmother   . Heart disease Maternal Grandfather   . Diabetes Mother   . Hyperlipidemia Mother   . Hypertension Father   . Heart disease Father   . Hypertension Paternal Grandmother   . Cancer Paternal Grandfather      Home Medications:   Prior to Admission medications   Medication Sig Start Date End Date Taking? Authorizing Provider  albuterol (PROVENTIL HFA;VENTOLIN HFA) 108 (90 BASE) MCG/ACT inhaler Inhale 2 puffs into the lungs every 6 (six) hours as needed for shortness of breath.   Yes Historical Provider, MD  albuterol (PROVENTIL) (2.5 MG/3ML) 0.083% nebulizer solution Take 2.5 mg by nebulization every 6 (six) hours as needed for shortness of breath.   Yes Historical Provider, MD  budesonide-formoterol (SYMBICORT) 160-4.5 MCG/ACT inhaler  Inhale 2 puffs into the lungs daily.    Yes Historical Provider, MD  escitalopram (LEXAPRO) 10 MG tablet Take 0.5 tablets (5 mg total) by mouth daily. 01/19/16  Yes [place] Dubois, MD  famotidine (PEPCID) 20 MG tablet Take 1 tablet (20 mg total) by mouth at bedtime. 01/25/16  Yes Florencia Reasons, MD  Insulin Glargine (BASAGLAR KWIKPEN) 100 UNIT/ML SOPN Inject 0.26 mLs (26 Units total) into the skin at bedtime. 01/19/16  Yes [place] Dubois, MD  magnesium oxide (MAG-OX) 400 MG tablet Take 1 tablet (400 mg total) by mouth daily. 01/25/16  Yes Florencia Reasons, MD  metoCLOPramide (REGLAN) 10 MG tablet Take 1 tablet (10 mg total) by mouth 3 (three) times daily before meals. 01/19/16  Yes [place] Dubois, MD  montelukast (SINGULAIR) 10 MG tablet Take 10 mg by mouth daily.   Yes Historical Provider, MD  NOVOLOG FLEXPEN 100 UNIT/ML FlexPen 1 UNIT FOR EVERY 10 GRAMS CARB W/MEALS/SNACKS PLUS SLIDING SCALE: 1 UNIT GLUCOSE 50 ABOVE 150.TDD 20 12/26/15  Yes Historical Provider, MD  pantoprazole (PROTONIX) 40 MG tablet Take 1 tablet (40 mg total) by mouth 2 (two) times daily. 01/19/16  Yes [place] Dubois, MD  potassium chloride SA (K-DUR,KLOR-CON) 20 MEQ tablet Take 1 tablet (20 mEq total) by mouth daily. 01/19/16  Yes [place] Dubois, MD  promethazine (PHENERGAN) 25 MG tablet Take 1 tablet (25 mg total) by mouth every 8 (eight) hours as needed for nausea or vomiting. 01/19/16  Yes [place] Dubois, MD  Vitamin D, Ergocalciferol, (DRISDOL) 50000 units CAPS capsule TAKE ONE CAPSULE (50,000 UNITS TOTAL) BY MOUTH ONCE A WEEK. Saturday 12/27/15  Yes Historical Provider, MD     Allergies:     Allergies  Allergen Reactions  . Peanut-Containing Drug Products Swelling    Swelling of mouth, lips  . Strawberry Extract Swelling    Swelling of mouth, lips  . Ultram [Tramadol] Itching     Physical Exam:   Vitals  Blood pressure 128/76, pulse (!) 138, resp. rate 19, height 5\' 4"  (1.626 m), weight 52.6 kg (116 lb), last menstrual period  02/19/2016, SpO2 100 %.   1. General  lying in bed in NAD,   2. Normal affect and insight, Not Suicidal or Homicidal, Awake Alert, Oriented X 3.  3. No F.N deficits, ALL C.Nerves Intact, Strength 5/5 all 4 extremities, Sensation intact all 4 extremities, Plantars down going.  4. Ears and Eyes appear Normal, Conjunctivae clear, PERRLA. Dry Oral Mucosa, as well dry blood and lips and tongue  5. Supple Neck, No JVD, No cervical lymphadenopathy appriciated, No Carotid Bruits.  6. Symmetrical Chest wall movement, Good air movement bilaterally, CTAB.  7. Tachycardic, No Gallops, Rubs or Murmurs, No Parasternal Heave.  8. Positive Bowel Sounds, Abdomen Soft,Mild diffuse tenderness, No organomegaly appriciated,No rebound -guarding or rigidity.  9.  No Cyanosis,Late Skin Turgor, No Skin Rash or Bruise.  10. Good muscle tone,  joints appear normal , no effusions, Normal ROM.  11. No Palpable Lymph Nodes in Neck or Axillae     Data Review:    CBC  Recent Labs Lab 02/20/16 0253  WBC 12.4*  HGB 13.3  HCT 42.9  PLT 567*  MCV 91.3  MCH 28.3  MCHC 31.0  RDW 14.3  LYMPHSABS 0.9  MONOABS 0.3  EOSABS 0.0  BASOSABS 0.0   ------------------------------------------------------------------------------------------------------------------  Chemistries   Recent Labs Lab 02/20/16 0253  NA 136  K 4.9  CL 102  CO2 10*  GLUCOSE 543*  BUN 18  CREATININE 0.95  CALCIUM 9.7  MG 2.1  AST 25  ALT 18  ALKPHOS 128*  BILITOT 2.3*   ------------------------------------------------------------------------------------------------------------------ estimated creatinine clearance is 74.5 mL/min (by C-G formula based on SCr of 0.95 mg/dL). ------------------------------------------------------------------------------------------------------------------ No results for input(s): TSH, T4TOTAL, T3FREE, THYROIDAB in the last 72 hours.  Invalid input(s): FREET3  Coagulation profile No  results for input(s): INR, PROTIME in the last 168 hours. ------------------------------------------------------------------------------------------------------------------- No results for input(s): DDIMER in the last 72 hours. -------------------------------------------------------------------------------------------------------------------  Cardiac Enzymes No results for input(s): CKMB, TROPONINI, MYOGLOBIN in the last 168 hours.  Invalid input(s): CK ------------------------------------------------------------------------------------------------------------------    Component Value Date/Time   BNP 6.9 07/11/2015 0353     ---------------------------------------------------------------------------------------------------------------  Urinalysis    Component Value Date/Time   COLORURINE YELLOW 02/20/2016 0422   APPEARANCEUR CLOUDY (A) 02/20/2016 0422   LABSPEC 1.023 02/20/2016 0422   PHURINE 5.0 02/20/2016 0422   GLUCOSEU >=500 (A) 02/20/2016 0422   GLUCOSEU >=1000 11/07/2012 1205   HGBUR LARGE (A) 02/20/2016 0422   BILIRUBINUR NEGATIVE 02/20/2016 0422   KETONESUR 80 (A) 02/20/2016 0422   PROTEINUR 100 (A) 02/20/2016 0422   UROBILINOGEN 0.2 11/24/2014 1045   NITRITE NEGATIVE 02/20/2016 0422   LEUKOCYTESUR LARGE (A) 02/20/2016 0422    ----------------------------------------------------------------------------------------------------------------   Imaging Results:    Dg Chest Port 1 View  Result Date: 02/20/2016 CLINICAL DATA:  Nausea and vomiting. EXAM: PORTABLE CHEST 1 VIEW COMPARISON:  01/17/2016 FINDINGS: The cardiomediastinal contours are normal. Bronchial thickening. Pulmonary vasculature is normal. No consolidation, pleural effusion, or pneumothorax. No acute osseous abnormalities are seen. IMPRESSION: Bronchial thickening.  No focal airspace consolidation Electronically Signed   By: Jeb Levering M.D.   On: 02/20/2016 04:40     Assessment & Plan:    Active  Problems:   DKA (diabetic ketoacidoses) (HCC)   Nausea vomiting and diarrhea   Hematemesis   Dehydration   Abdominal pain  DKA diabetes mellitus type 1 - Recent hospitalization for similar problem, will be admitted to step down, and started on insulin drip DKA protocol. - Continue with IV fluids - Monitor glucose closely, and replete as needed. - Check hemoglobin A1c -  significant dehydration, with sinus tachycardia, continue with IV fluids.  UTI - Positive urinalysis, will follow on urine cultures, given how toxic she looks will start on Rocephin  Abdominal pain, nausea vomiting/coffee ground emesis - EGD 2016 with diffuse gastritis, continue with Protonix drip, keep nothing by mouth, GI consulted. - Monitor H&H closely and transfuse as needed -  Most recent CT in October 2017, will repeat CT abdomen and pelvis further evaluate her abdominal pain(has some chronic abdominal pain, but not as intense, she has known history of gastroparesis) - Lipase within normal limits - Follow on influenza panel  Asthma - Continue home medication, continue with when necessary albuterol  Urinary retention - bladder scan >913ml, will order when necessary straight cath in and out, and bladder scan every 8 hours.  DVT Prophylaxis SCD  AM Labs Ordered, also please review Full Orders  Family Communication: Admission, patients condition and plan of care including tests being ordered have been discussed with the patient who indicate understanding and agree with the plan and Code Status.  Code Status Full  Likely DC to home  Condition GUARDED    Consults called: [facility]  Admission status: inpatient  Time spent in minutes : 70 minutes   ELGERGAWY, DAWOOD M.D on 02/20/2016 at 9:46 AM  Between 7am to 7pm - Pager - [phone]. After 7pm go to [email or web address] - password [facility]  [facility] - Office  [phone]

## 2016-02-20 NOTE — Progress Notes (Signed)
PT RN- states they have been unable to obtain blood sample to complete ordered VBG.

## 2016-02-20 NOTE — ED Notes (Signed)
Pt is aware that urine is needed.   

## 2016-02-20 NOTE — ED Triage Notes (Signed)
Patient is experiencing generalized abd pain with nauseam, vomiting, and diarrhea. Pt had a tooth pulled yesterday feels like this has contributed to her symptoms. Pt has not took any medications.

## 2016-02-20 NOTE — ED Notes (Signed)
Patients mother had to leave- call for update or to let know when being discharged.    Stefanie [phone]

## 2016-02-20 NOTE — Progress Notes (Signed)
Although uninsured pt confirms with ED CM that she sees T Anderson for [employer] CM spoke with pt who confirms uninsured [employer] resident with no pcp.  CM discussed and provided written information to assist pt with determining choice for uninsured accepting pcps, discussed the importance of pcp vs EDP services for f/u care, www.needymeds.org, www.goodrx.com, discounted pharmacies and other [employer] such as [employer] [facility] [employer], affordable care act, financial assistance, uninsured dental services, [place] med assist, DSS and  health department  Reviewed resources for [employer] uninsured accepting pcps like [facility], family medicine at [employer], [facility], [facility], local urgent care centers, [facility], [facility] family practice, general medical clinics, [facility] of [facility] [place], [facility] urgent care plus others, medication resources, [facility] out patient [facility]ies and housing Pt voiced understanding and appreciation of resources provided  Encouraged use of goodrx for assist with discounted medications Provided [facility] contact information

## 2016-02-20 NOTE — ED Notes (Signed)
Unable to obtain labs at this time due to two IV lines infusing. RN Ati instructed to not obtain labs.

## 2016-02-20 NOTE — ED Notes (Signed)
Bed: WA10 Expected date:  Expected time:  Means of arrival:  Comments: Hold for triage 2 

## 2016-02-20 NOTE — ED Provider Notes (Signed)
[facility] DEPT Provider Note   CSN: [id number] Arrival date & time: 02/20/16  0014   By signing my name below, I, Delton Prairie, attest that this documentation has been prepared under the direction and in the presence of Delora Fuel, MD  Electronically Signed: Delton Prairie, ED Scribe. 02/20/16. 2:14 AM.   History   Chief Complaint Chief Complaint  Patient presents with  . Abdominal Pain  . Emesis   The history is provided by the patient. No language interpreter was used.   HPI Comments:  [name] is a 26 y.o. female, with a hx of DM and HTN, who presents to the Emergency Department complaining of acute onset, generalized, "10/10" abdominal pain which began yesterday. Pt also reports nausea, vomiting, hematemesis and diarrhea. She states she had a tooth pulled yesterday and thinks the medication she was given caused her symptoms. Pt does not know what medication she had. Pt denies back pain or any other associated symptoms at this time. She notes phenergan and Reglan usually help her symptoms.   Past Medical History:  Diagnosis Date  . Anxiety   . Arthritis   . Asthma   . Diabetes mellitus 2007   IDDM.  poorly controlled, multiple admits with DKA  . Gallstones   . Heart murmur   . Hepatic steatosis 11/26/2014   and hepatomegaly  . Hypertension    NOT CURRENTLY ON ANY BP MED  . Liver mass 11/26/2014  . Pancreatitis, acute 11/26/2014    Patient Active Problem List   Diagnosis Date Noted  . UTI (urinary tract infection) 01/22/2016  . Depression   . Diarrhea 11/09/2015  . Acute urinary retention   . Gastroparesis due to DM ([place]) 07/10/2015  . GERD (gastroesophageal reflux disease) 07/10/2015  . Depression with anxiety 07/10/2015  . Uncontrolled type 1 diabetes mellitus with hyperglycemia ([place])   . Gastroparesis 06/22/2015  . Altered mental status 06/22/2015  . Volume depletion 06/10/2015  . Protein-calorie malnutrition, severe 06/10/2015  . Hyperglycemia   .  Intractable vomiting with nausea   . Elevated bilirubin   . Type 1 diabetes mellitus with hyperglycemia ([place]) 05/29/2015  . Hematemesis with nausea   . Intractable vomiting 05/28/2015  . Intractable nausea and vomiting 05/28/2015  . Abdominal pain in female   . Abdominal pain 05/24/2015  . Hypertension 05/24/2015  . Dehydration   . Diabetic gastroparesis ([place])   . Chronic diastolic heart failure ([place]) 04/11/2015  . Hematemesis 04/08/2015  . DKA (diabetic ketoacidoses) ([place]) 03/22/2015  . Nausea vomiting and diarrhea 03/22/2015  . Nausea & vomiting   . Diabetic ketoacidosis without coma associated with type 1 diabetes mellitus ([place])   . S/P laparoscopic cholecystectomy 02/11/2015  . Status post laparoscopic cholecystectomy 02/11/2015  . Postextubation stridor   . Nausea and vomiting 11/27/2014  . Pancreatitis, acute 11/26/2014  . Volume overload 11/26/2014  . Hypokalemia 11/26/2014  . Hepatic steatosis 11/26/2014  . Liver mass 11/26/2014  . Sepsis ([place]) 11/25/2014  . Sinus tachycardia 11/25/2014  . Hypomagnesemia 11/25/2014  . Hypophosphatemia 11/25/2014  . Elevated amylase and lipase 11/25/2014  . DKA, type 1 ([place]) 11/24/2014  . Elevated LFTs 11/24/2014  . Acute kidney injury ([place]) 11/24/2014  . Migraine headache 11/24/2014  . Asthma 06/29/2012  . Uncontrolled type 1 diabetes mellitus ([place]) 06/19/2010  . Goiter, unspecified 06/19/2010    Past Surgical History:  Procedure Laterality Date  . CHOLECYSTECTOMY N/A 02/11/2015   Procedure: LAPAROSCOPIC CHOLECYSTECTOMY WITH INTRAOPERATIVE CHOLANGIOGRAM;  Surgeon: Greer Pickerel, MD;  Location: [facility];  Service: General;  Laterality: N/A;  . ESOPHAGOGASTRODUODENOSCOPY (EGD) WITH PROPOFOL Left 09/20/2014   Procedure: ESOPHAGOGASTRODUODENOSCOPY (EGD) WITH PROPOFOL;  Surgeon: Arta Silence, MD;  [facility];  Service: Endoscopy;  Laterality: Left;  . WISDOM TOOTH EXTRACTION      OB History    Gravida Para Term Preterm AB  Living   2 1 0 1 1 1    SAB TAB Ectopic Multiple Live Births   0 1 0 0 1       Home Medications    Prior to Admission medications   Medication Sig Start Date End Date Taking? Authorizing Provider  albuterol (PROVENTIL HFA;VENTOLIN HFA) 108 (90 BASE) MCG/ACT inhaler Inhale 2 puffs into the lungs every 6 (six) hours as needed for shortness of breath.    Historical Provider, MD  albuterol (PROVENTIL) (2.5 MG/3ML) 0.083% nebulizer solution Take 2.5 mg by nebulization every 6 (six) hours as needed for shortness of breath.    Historical Provider, MD  budesonide-formoterol (SYMBICORT) 160-4.5 MCG/ACT inhaler Inhale 2 puffs into the lungs daily.     Historical Provider, MD  escitalopram (LEXAPRO) 10 MG tablet Take 0.5 tablets (5 mg total) by mouth daily. 01/19/16   Barton Dubois, MD  famotidine (PEPCID) 20 MG tablet Take 1 tablet (20 mg total) by mouth at bedtime. 01/25/16   Florencia Reasons, MD  Insulin Glargine (BASAGLAR KWIKPEN) 100 UNIT/ML SOPN Inject 0.26 mLs (26 Units total) into the skin at bedtime. 01/19/16   Barton Dubois, MD  magnesium oxide (MAG-OX) 400 MG tablet Take 1 tablet (400 mg total) by mouth daily. 01/25/16   Florencia Reasons, MD  metoCLOPramide (REGLAN) 10 MG tablet Take 1 tablet (10 mg total) by mouth 3 (three) times daily before meals. 01/19/16   Barton Dubois, MD  montelukast (SINGULAIR) 10 MG tablet Take 10 mg by mouth daily.    Historical Provider, MD  NOVOLOG FLEXPEN 100 UNIT/ML FlexPen 1 UNIT FOR EVERY 10 GRAMS CARB W/MEALS/SNACKS PLUS SLIDING SCALE: 1 UNIT GLUCOSE 50 ABOVE 150.TDD 20 12/26/15   Historical Provider, MD  pantoprazole (PROTONIX) 40 MG tablet Take 1 tablet (40 mg total) by mouth 2 (two) times daily. 01/19/16   Barton Dubois, MD  potassium chloride SA (K-DUR,KLOR-CON) 20 MEQ tablet Take 1 tablet (20 mEq total) by mouth daily. 01/19/16   Barton Dubois, MD  promethazine (PHENERGAN) 25 MG tablet Take 1 tablet (25 mg total) by mouth every 8 (eight) hours as needed for nausea or vomiting.  01/19/16   Barton Dubois, MD  Vitamin D, Ergocalciferol, (DRISDOL) 50000 units CAPS capsule TAKE ONE CAPSULE (50,000 UNITS TOTAL) BY MOUTH ONCE A WEEK. Saturday 12/27/15   Historical Provider, MD    Family History Family History  Problem Relation Age of Onset  . Heart disease Maternal Grandmother   . Heart disease Maternal Grandfather   . Diabetes Mother   . Hyperlipidemia Mother   . Hypertension Father   . Heart disease Father   . Hypertension Paternal Grandmother   . Cancer Paternal Grandfather     Social History Social History  Substance Use Topics  . Smoking status: Never Smoker  . Smokeless tobacco: Never Used  . Alcohol use No     Allergies   Peanut-containing drug products; Strawberry extract; and Ultram [tramadol]   Review of Systems Review of Systems  Gastrointestinal: Positive for abdominal pain, diarrhea, nausea and vomiting.  Musculoskeletal: Negative for back pain.  All other systems reviewed and are negative.    Physical Exam  Updated Vital Signs BP (!) 156/116 (BP Location: Right Arm)   Pulse (!) 139   Resp 20   Ht 5\' 4"  (1.626 m)   Wt 116 lb (52.6 kg)   LMP 02/19/2016   SpO2 99%   BMI 19.91 kg/m   Physical Exam  Constitutional: She is oriented to person, place, and time. She appears well-developed and well-nourished.  Appears uncomfortable. Actively retching.   HENT:  Head: Normocephalic and atraumatic.  Eyes: EOM are normal. Pupils are equal, round, and reactive to light.  Neck: Normal range of motion. Neck supple. No JVD present.  Cardiovascular: Normal rate, regular rhythm and normal heart sounds.   No murmur heard. Pulmonary/Chest: Effort normal and breath sounds normal. She has no wheezes. She has no rales. She exhibits no tenderness.  Abdominal: Soft. She exhibits no distension and no mass. There is no tenderness.  Bowel sounds are decreased.   Musculoskeletal: Normal range of motion. She exhibits no edema.  Lymphadenopathy:    She  has no cervical adenopathy.  Neurological: She is alert and oriented to person, place, and time. No cranial nerve deficit. She exhibits normal muscle tone. Coordination normal.  Skin: Skin is warm and dry. No rash noted.  Psychiatric: She has a normal mood and affect. Her behavior is normal. Judgment and thought content normal.  Nursing note and vitals reviewed.    ED Treatments / Results  DIAGNOSTIC STUDIES:  Oxygen Saturation is 99% on RA, normal by my interpretation.    COORDINATION OF CARE:  2:14 AM Discussed treatment plan with pt at bedside and pt agreed to plan.  Labs (all labs ordered are listed, but only abnormal results are displayed) Labs Reviewed  COMPREHENSIVE METABOLIC PANEL - Abnormal; Notable for the following:       Result Value   CO2 10 (*)    Glucose, Bld 543 (*)    Total Protein 9.1 (*)    Alkaline Phosphatase 128 (*)    Total Bilirubin 2.3 (*)    Anion gap 24 (*)    All other components within normal limits  LIPASE, BLOOD - Abnormal; Notable for the following:    Lipase 10 (*)    All other components within normal limits  CBC WITH DIFFERENTIAL/PLATELET - Abnormal; Notable for the following:    WBC 12.4 (*)    Platelets 567 (*)    Neutro Abs 11.2 (*)    All other components within normal limits  URINALYSIS, ROUTINE W REFLEX MICROSCOPIC - Abnormal; Notable for the following:    APPearance CLOUDY (*)    Glucose, UA >=500 (*)    Hgb urine dipstick LARGE (*)    Ketones, ur 80 (*)    Protein, ur 100 (*)    Leukocytes, UA LARGE (*)    Squamous Epithelial / LPF 0-5 (*)    All other components within normal limits  CBG MONITORING, ED - Abnormal; Notable for the following:    Glucose-Capillary 507 (*)    All other components within normal limits  MAGNESIUM  BLOOD GAS, VENOUS  I-STAT BETA HCG BLOOD, ED ([facility], [facility], [facility] ONLY)  TYPE AND SCREEN    Radiology No results found.  Procedures Procedures (including critical care time) CRITICAL CARE Performed  by: [id number] Total critical care time: 55 minutes Critical care time was exclusive of separately billable procedures and treating other patients. Critical care was necessary to treat or prevent imminent or life-threatening deterioration. Critical care was time spent personally by me on the following activities:  development of treatment plan with patient and/or surrogate as well as nursing, discussions with consultants, evaluation of patient's response to treatment, examination of patient, obtaining history from patient or surrogate, ordering and performing treatments and interventions, ordering and review of laboratory studies, ordering and review of radiographic studies, pulse oximetry and re-evaluation of patient's condition.  Medications Ordered in ED Medications  dextrose 5 %-0.45 % sodium chloride infusion (not administered)  insulin regular bolus via infusion 0-10 Units (not administered)  insulin regular (NOVOLIN R,HUMULIN R) 250 Units in sodium chloride 0.9 % 250 mL (1 Units/mL) infusion (not administered)  dextrose 50 % solution 25 mL (not administered)  0.9 %  sodium chloride infusion (not administered)  pantoprazole (PROTONIX) 80 mg in sodium chloride 0.9 % 250 mL (0.32 mg/mL) infusion (not administered)  pantoprazole (PROTONIX) injection 40 mg (not administered)  pantoprazole (PROTONIX) injection 40 mg (not administered)  sodium chloride 0.9 % bolus 1,000 mL (1,000 mLs Intravenous New Bag/Given 02/20/16 0256)  metoCLOPramide (REGLAN) injection 10 mg (10 mg Intravenous Given 02/20/16 0251)  diphenhydrAMINE (BENADRYL) injection 25 mg (25 mg Intravenous Given 02/20/16 0253)  ketorolac (TORADOL) 15 MG/ML injection 15 mg (15 mg Intravenous Given 02/20/16 0250)  pantoprazole (PROTONIX) injection 40 mg (40 mg Intravenous Given 02/20/16 0253)     Initial Impression / Assessment and Plan / ED Course  I have reviewed the triage vital signs and the nursing notes.  Pertinent labs & imaging  results that were available during my care of the patient were reviewed by me and considered in my medical decision making (see chart for details).  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 Old records are reviewed showing to recent hospitalizations-1 for ketoacidosis and 1 for gastroparesis. Emesis does appear blood-tinged and is tested via Hemoccult and is Hemoccult positive. She started on intravenous pantoprazole. She is given metoclopramide for nausea and IV fluids are initiated. She is also given a dose of ketorolac for pain. Laboratory workup shows evidence of ketoacidosis with elevated anion gap. She started on glucose stabilizer. Case is discussed with Dr. Hal Hope of [facility] hospital[facility] who agrees to admit the patient to stepdown unit.  Final Clinical Impressions(s) / ED Diagnoses   Final diagnoses:  Diabetic ketoacidosis without coma associated with type 1 diabetes mellitus ([place])  Hematemesis with nausea    New Prescriptions New Prescriptions   No medications on file   I personally performed the services described in this documentation, which was scribed in my presence. The recorded information has been reviewed and is accurate.       Delora Fuel, MD [phone]

## 2016-02-20 NOTE — Consult Note (Signed)
[facility] Consult Note  Referring Provider: No ref. provider found Primary Care Physician:  Vicenta Aly, FNP Primary Gastroenterologist:  Dr.  Laurel Dimmer Complaint: Vomiting blood HPI: [name] is an 26 y.o. black female  diabetic who presents with vomiting yesterday initially nonbloody reportedly becoming bloody later today. He has a normal hemoglobin and BUN but a glucose of 665 with a metabolic acidosis consistent with DKA. She vomited on the floor witnessed by nurses described as green to brown with no blood or coffee grounds. She is writhing on the bed complaining of pain all over particularly in her abdomen. She had an EGD in 2016 which was completely normal.  Past Medical History:  Diagnosis Date  . Anxiety   . Arthritis   . Asthma   . Diabetes mellitus 2007   IDDM.  poorly controlled, multiple admits with DKA  . Gallstones   . Heart murmur   . Hepatic steatosis 11/26/2014   and hepatomegaly  . Hypertension    NOT CURRENTLY ON ANY BP MED  . Liver mass 11/26/2014  . Pancreatitis, acute 11/26/2014    Past Surgical History:  Procedure Laterality Date  . CHOLECYSTECTOMY N/A 02/11/2015   Procedure: LAPAROSCOPIC CHOLECYSTECTOMY WITH INTRAOPERATIVE CHOLANGIOGRAM;  Surgeon: Greer Pickerel, MD;  Location: [facility];  Service: General;  Laterality: N/A;  . ESOPHAGOGASTRODUODENOSCOPY (EGD) WITH PROPOFOL Left 09/20/2014   Procedure: ESOPHAGOGASTRODUODENOSCOPY (EGD) WITH PROPOFOL;  Surgeon: Arta Silence, MD;  Location: [facility] ENDOSCOPY;  Service: Endoscopy;  Laterality: Left;  . WISDOM TOOTH EXTRACTION       (Not in a hospital admission)  Allergies:  Allergies  Allergen Reactions  . Peanut-Containing Drug Products Swelling    Swelling of mouth, lips  . Strawberry Extract Swelling    Swelling of mouth, lips  . Ultram [Tramadol] Itching    Family History  Problem Relation Age of Onset  . Heart disease Maternal Grandmother   . Heart disease Maternal Grandfather    . Diabetes Mother   . Hyperlipidemia Mother   . Hypertension Father   . Heart disease Father   . Hypertension Paternal Grandmother   . Cancer Paternal Grandfather     Social History:  reports that she has never smoked. She has never used smokeless tobacco. She reports that she uses drugs, including Marijuana. She reports that she does not drink alcohol.  Review of Systems: negative except As above currently unobtainable   Blood pressure 116/66, pulse 118, resp. rate 14, height _0  (1.626 m), weight 52.6 kg (116 lb), last menstrual period 02/19/2016, SpO2 99 %. Head: Normocephalic, without obvious abnormality, atraumatic Neck: no adenopathy, no carotid bruit, no JVD, supple, symmetrical, trachea midline and thyroid not enlarged, symmetric, no tenderness/mass/nodules Resp: clear to auscultation bilaterally Cardio: regular rate and rhythm, S1, S2 normal, no murmur, click, rub or gallop GI: Abdomen diffusely tender somewhat inconsistent difficult examination no obvious organomegaly Extremities: extremities normal, atraumatic, no cyanosis or edema  Results for orders placed or performed during the hospital encounter of 02/20/16 (from the past 48 hour(s))  Comprehensive metabolic panel     Status: Abnormal   Collection Time: 02/20/16  2:53 AM  Result Value Ref Range   Sodium 136 135 - 145 mmol/L   Potassium 4.9 3.5 - 5.1 mmol/L   Chloride 102 101 - 111 mmol/L   CO2 10 (L) 22 - 32 mmol/L   Glucose, Bld 543 (HH) 65 - 99 mg/dL    Comment: CRITICAL RESULT CALLED TO, READ BACK BY AND VERIFIED WITH: E BEAULAURIER  RN @ 0348 ON 02/20/16 BY C DAVIS    BUN 18 6 - 20 mg/dL   Creatinine, Ser 0.95 0.44 - 1.00 mg/dL   Calcium 9.7 8.9 - 10.3 mg/dL   Total Protein 9.1 (H) 6.5 - 8.1 g/dL   Albumin 4.8 3.5 - 5.0 g/dL   AST 25 15 - 41 U/L   ALT 18 14 - 54 U/L   Alkaline Phosphatase 128 (H) 38 - 126 U/L   Total Bilirubin 2.3 (H) 0.3 - 1.2 mg/dL   GFR calc non Af Amer >60 >60 mL/min   GFR calc Af  Amer >60 >60 mL/min    Comment: (NOTE) The eGFR has been calculated using the CKD EPI equation. This calculation has not been validated in all clinical situations. eGFR's persistently <60 mL/min signify possible Chronic Kidney Disease.    Anion gap 24 (H) 5 - 15  Lipase, blood     Status: Abnormal   Collection Time: 02/20/16  2:53 AM  Result Value Ref Range   Lipase 10 (L) 11 - 51 U/L  CBC with Differential     Status: Abnormal   Collection Time: 02/20/16  2:53 AM  Result Value Ref Range   WBC 12.4 (H) 4.0 - 10.5 K/uL   RBC 4.70 3.87 - 5.11 MIL/uL   Hemoglobin 13.3 12.0 - 15.0 g/dL   HCT 42.9 36.0 - 46.0 %   MCV 91.3 78.0 - 100.0 fL   MCH 28.3 26.0 - 34.0 pg   MCHC 31.0 30.0 - 36.0 g/dL   RDW 14.3 11.5 - 15.5 %   Platelets 567 (H) 150 - 400 K/uL   Neutrophils Relative % 90 %   Neutro Abs 11.2 (H) 1.7 - 7.7 K/uL   Lymphocytes Relative 7 %   Lymphs Abs 0.9 0.7 - 4.0 K/uL   Monocytes Relative 3 %   Monocytes Absolute 0.3 0.1 - 1.0 K/uL   Eosinophils Relative 0 %   Eosinophils Absolute 0.0 0.0 - 0.7 K/uL   Basophils Relative 0 %   Basophils Absolute 0.0 0.0 - 0.1 K/uL  Magnesium     Status: None   Collection Time: 02/20/16  2:53 AM  Result Value Ref Range   Magnesium 2.1 1.7 - 2.4 mg/dL  Type and screen [place]     Status: None   Collection Time: 02/20/16  2:54 AM  Result Value Ref Range   ABO/RH(D) O POS    Antibody Screen NEG    Sample Expiration 02/23/2016   I-Stat beta hCG blood, ED     Status: None   Collection Time: 02/20/16  3:07 AM  Result Value Ref Range   I-stat hCG, quantitative <5.0 <5 mIU/mL   Comment 3            Comment:   GEST. AGE      CONC.  (mIU/mL)   <=1 WEEK        5 - 50     2 WEEKS       50 - 500     3 WEEKS       100 - 10,000     4 WEEKS     1,000 - 30,000        FEMALE AND NON-PREGNANT FEMALE:     LESS THAN 5 mIU/mL   Urinalysis, Routine w reflex microscopic     Status: Abnormal   Collection Time: 02/20/16  4:22 AM   Result Value Ref Range   Color, Urine YELLOW  YELLOW   APPearance CLOUDY (A) CLEAR   Specific Gravity, Urine 1.023 1.005 - 1.030   pH 5.0 5.0 - 8.0   Glucose, UA >=500 (A) NEGATIVE mg/dL   Hgb urine dipstick LARGE (A) NEGATIVE   Bilirubin Urine NEGATIVE NEGATIVE   Ketones, ur 80 (A) NEGATIVE mg/dL   Protein, ur 100 (A) NEGATIVE mg/dL   Nitrite NEGATIVE NEGATIVE   Leukocytes, UA LARGE (A) NEGATIVE   RBC / HPF TOO NUMEROUS TO COUNT 0 - 5 RBC/hpf   WBC, UA TOO NUMEROUS TO COUNT 0 - 5 WBC/hpf   Bacteria, UA NONE SEEN NONE SEEN   Squamous Epithelial / LPF 0-5 (A) NONE SEEN   Amorphous Crystal PRESENT   CBG monitoring, ED     Status: Abnormal   Collection Time: 02/20/16  4:38 AM  Result Value Ref Range   Glucose-Capillary 507 (HH) 65 - 99 mg/dL  CBG monitoring, ED     Status: Abnormal   Collection Time: 02/20/16  6:05 AM  Result Value Ref Range   Glucose-Capillary 380 (H) 65 - 99 mg/dL  CBG monitoring, ED     Status: Abnormal   Collection Time: 02/20/16  7:29 AM  Result Value Ref Range   Glucose-Capillary 308 (H) 65 - 99 mg/dL  Influenza panel by PCR (type A & B)     Status: None   Collection Time: 02/20/16  8:35 AM  Result Value Ref Range   Influenza A By PCR NEGATIVE NEGATIVE   Influenza B By PCR NEGATIVE NEGATIVE    Comment: (NOTE) The Xpert Xpress Flu assay is intended as an aid in the diagnosis of  influenza and should not be used as a sole basis for treatment.  This  assay is FDA approved for nasopharyngeal swab specimens only. Nasal  washings and aspirates are unacceptable for Xpert Xpress Flu testing.   CBG monitoring, ED     Status: Abnormal   Collection Time: 02/20/16  8:36 AM  Result Value Ref Range   Glucose-Capillary 225 (H) 65 - 99 mg/dL  CBG monitoring, ED     Status: Abnormal   Collection Time: 02/20/16  9:45 AM  Result Value Ref Range   Glucose-Capillary 235 (H) 65 - 99 mg/dL  CBG monitoring, ED     Status: Abnormal   Collection Time: 02/20/16 10:59 AM   Result Value Ref Range   Glucose-Capillary 140 (H) 65 - 99 mg/dL  CBG monitoring, ED     Status: Abnormal   Collection Time: 02/20/16 12:40 PM  Result Value Ref Range   Glucose-Capillary 150 (H) 65 - 99 mg/dL  CBG monitoring, ED     Status: Abnormal   Collection Time: 02/20/16  1:59 PM  Result Value Ref Range   Glucose-Capillary 153 (H) 65 - 99 mg/dL   Ct Abdomen Pelvis Wo Contrast  Result Date: 02/20/2016 CLINICAL DATA:  Diabetic patient with abdominal pain coffee ground emesis today. Recent diagnosis of pancreatitis. EXAM: CT ABDOMEN AND PELVIS WITHOUT CONTRAST TECHNIQUE: Multidetector CT imaging of the abdomen and pelvis was performed following the standard protocol without IV contrast. COMPARISON:  CT abdomen and pelvis 11/02/2015 and 04/21/2015. MRI abdomen 11/27/2014. FINDINGS: Lower chest: Lung bases are clear. No pleural or pericardial effusion. Hepatobiliary: Status post cholecystectomy. Three low attenuating lesions in the liver measuring 1.8 cm on, 1.5 cm on image 16 image 23 and 2.5 cm on image 35 are unchanged and compatible with adenomas based on prior imaging. Pancreas: Unremarkable. No pancreatic ductal dilatation or surrounding  inflammatory changes. Spleen: Normal in size without focal abnormality. Adrenals/Urinary Tract: The adrenal glands are unremarkable. Mild right hydronephrosis is presumably due to marked bladder distention which is present on the prior exams. The left kidney appears normal. No urinary tract stone is identified. Stomach/Bowel: Stomach is within normal limits. Appendix appears normal. No evidence of bowel wall thickening, distention, or inflammatory changes. Vascular/Lymphatic: No significant vascular findings are present. No enlarged abdominal or pelvic lymph nodes. Reproductive: Uterus and bilateral adnexa are unremarkable. Other: No abdominal wall hernia or abnormality. No abdominopelvic ascites. Musculoskeletal: Negative. IMPRESSION: No acute abnormality. Mild  right hydronephrosis is presumably due to marked distention of the urinary bladder, a chronic abnormality. Cause for distention is not identified. No change in 3 liver lesions presumably representing adenomas based on prior exams. Electronically Signed   By: Inge Rise M.D.   On: 02/20/2016 12:29   Dg Chest Port 1 View  Result Date: 02/20/2016 CLINICAL DATA:  Nausea and vomiting. EXAM: PORTABLE CHEST 1 VIEW COMPARISON:  01/17/2016 FINDINGS: The cardiomediastinal contours are normal. Bronchial thickening. Pulmonary vasculature is normal. No consolidation, pleural effusion, or pneumothorax. No acute osseous abnormalities are seen. IMPRESSION: Bronchial thickening.  No focal airspace consolidation Electronically Signed   By: Jeb Levering M.D.   On: 02/20/2016 04:40    Assessment: Reported hematemesis apparently very low-grade it at the moment, associated with probable diabetic ketoacidosis Plan:  Observe for more signs of significant GI bleeding otherwise treat empirically with PPI consider Carafate as well as anti-medics as needed. Will follow-up tomorrow and determine if she needs EGD. Amrita Radu C 02/20/2016, 2:09 PM  Pager [phone] If no answer or after 5 PM call [phone]

## 2016-02-21 DIAGNOSIS — N1330 Unspecified hydronephrosis: Secondary | ICD-10-CM | POA: Diagnosis present

## 2016-02-21 DIAGNOSIS — R000 Tachycardia, unspecified: Secondary | ICD-10-CM | POA: Diagnosis present

## 2016-02-21 DIAGNOSIS — J45909 Unspecified asthma, uncomplicated: Secondary | ICD-10-CM | POA: Diagnosis present

## 2016-02-21 DIAGNOSIS — Z87440 Personal history of urinary (tract) infections: Secondary | ICD-10-CM | POA: Diagnosis not present

## 2016-02-21 DIAGNOSIS — I10 Essential (primary) hypertension: Secondary | ICD-10-CM | POA: Diagnosis present

## 2016-02-21 DIAGNOSIS — E860 Dehydration: Secondary | ICD-10-CM | POA: Diagnosis present

## 2016-02-21 DIAGNOSIS — B3749 Other urogenital candidiasis: Secondary | ICD-10-CM | POA: Diagnosis present

## 2016-02-21 DIAGNOSIS — E1010 Type 1 diabetes mellitus with ketoacidosis without coma: Secondary | ICD-10-CM | POA: Diagnosis present

## 2016-02-21 DIAGNOSIS — R1084 Generalized abdominal pain: Secondary | ICD-10-CM

## 2016-02-21 DIAGNOSIS — R112 Nausea with vomiting, unspecified: Secondary | ICD-10-CM | POA: Diagnosis present

## 2016-02-21 DIAGNOSIS — R339 Retention of urine, unspecified: Secondary | ICD-10-CM | POA: Diagnosis present

## 2016-02-21 DIAGNOSIS — K219 Gastro-esophageal reflux disease without esophagitis: Secondary | ICD-10-CM | POA: Diagnosis present

## 2016-02-21 DIAGNOSIS — Z8249 Family history of ischemic heart disease and other diseases of the circulatory system: Secondary | ICD-10-CM | POA: Diagnosis not present

## 2016-02-21 DIAGNOSIS — K920 Hematemesis: Secondary | ICD-10-CM | POA: Diagnosis present

## 2016-02-21 DIAGNOSIS — Z794 Long term (current) use of insulin: Secondary | ICD-10-CM | POA: Diagnosis not present

## 2016-02-21 DIAGNOSIS — Z833 Family history of diabetes mellitus: Secondary | ICD-10-CM | POA: Diagnosis not present

## 2016-02-21 DIAGNOSIS — Z9049 Acquired absence of other specified parts of digestive tract: Secondary | ICD-10-CM | POA: Diagnosis not present

## 2016-02-21 DIAGNOSIS — N319 Neuromuscular dysfunction of bladder, unspecified: Secondary | ICD-10-CM | POA: Diagnosis present

## 2016-02-21 LAB — BASIC METABOLIC PANEL
ANION GAP: 8 (ref 5–15)
ANION GAP: 8 (ref 5–15)
Anion gap: 9 (ref 5–15)
Anion gap: 11 (ref 5–15)
Anion gap: 11 (ref 5–15)
Anion gap: 8 (ref 5–15)
BUN: 6 mg/dL (ref 6–20)
BUN: 9 mg/dL (ref 6–20)
BUN: 8 mg/dL (ref 6–20)
BUN: <5 mg/dL — ABNORMAL LOW (ref 6–20)
BUN: <5 mg/dL — ABNORMAL LOW (ref 6–20)
CALCIUM: 8.5 mg/dL — AB (ref 8.9–10.3)
CALCIUM: 8.3 mg/dL — AB (ref 8.9–10.3)
CALCIUM: 8.3 mg/dL — AB (ref 8.9–10.3)
CO2: 15 mmol/L — AB (ref 22–32)
CO2: 15 mmol/L — ABNORMAL LOW (ref 22–32)
CO2: 16 mmol/L — AB (ref 22–32)
CO2: 16 mmol/L — ABNORMAL LOW (ref 22–32)
CO2: 18 mmol/L — ABNORMAL LOW (ref 22–32)
CO2: 20 mmol/L — ABNORMAL LOW (ref 22–32)
CREATININE: 0.52 mg/dL (ref 0.44–1.00)
CREATININE: 0.48 mg/dL (ref 0.44–1.00)
CREATININE: 0.45 mg/dL (ref 0.44–1.00)
CREATININE: 0.51 mg/dL (ref 0.44–1.00)
Calcium: 7.9 mg/dL — ABNORMAL LOW (ref 8.9–10.3)
Calcium: 7.9 mg/dL — ABNORMAL LOW (ref 8.9–10.3)
Calcium: 8.2 mg/dL — ABNORMAL LOW (ref 8.9–10.3)
Chloride: 108 mmol/L (ref 101–111)
Chloride: 108 mmol/L (ref 101–111)
Chloride: 109 mmol/L (ref 101–111)
Chloride: 110 mmol/L (ref 101–111)
Chloride: 113 mmol/L — ABNORMAL HIGH (ref 101–111)
Chloride: 114 mmol/L — ABNORMAL HIGH (ref 101–111)
Creatinine, Ser: 0.6 mg/dL (ref 0.44–1.00)
Creatinine, Ser: 0.73 mg/dL (ref 0.44–1.00)
GFR calc Af Amer: >60 mL/min (ref >60)
GFR calc non Af Amer: >60 mL/min (ref >60)
GFR calc non Af Amer: >60 mL/min (ref >60)
GFR calc non Af Amer: >60 mL/min (ref >60)
GLUCOSE: 134 mg/dL — AB (ref 65–99)
Glucose, Bld: 134 mg/dL — ABNORMAL HIGH (ref 65–99)
Glucose, Bld: 139 mg/dL — ABNORMAL HIGH (ref 65–99)
Glucose, Bld: 160 mg/dL — ABNORMAL HIGH (ref 65–99)
Glucose, Bld: 180 mg/dL — ABNORMAL HIGH (ref 65–99)
Glucose, Bld: 180 mg/dL — ABNORMAL HIGH (ref 65–99)
POTASSIUM: 3.7 mmol/L (ref 3.5–5.1)
Potassium: 3.6 mmol/L (ref 3.5–5.1)
Potassium: 3.4 mmol/L — ABNORMAL LOW (ref 3.5–5.1)
Potassium: 3.8 mmol/L (ref 3.5–5.1)
Potassium: 3.3 mmol/L — ABNORMAL LOW (ref 3.5–5.1)
Potassium: 2.9 mmol/L — ABNORMAL LOW (ref 3.5–5.1)
SODIUM: 135 mmol/L (ref 135–145)
SODIUM: 136 mmol/L (ref 135–145)
SODIUM: 136 mmol/L (ref 135–145)
SODIUM: 137 mmol/L (ref 135–145)
SODIUM: 138 mmol/L (ref 135–145)
Sodium: 135 mmol/L (ref 135–145)

## 2016-02-21 LAB — GLUCOSE, CAPILLARY
GLUCOSE-CAPILLARY: 115 mg/dL — AB (ref 65–99)
GLUCOSE-CAPILLARY: 118 mg/dL — AB (ref 65–99)
GLUCOSE-CAPILLARY: 127 mg/dL — AB (ref 65–99)
GLUCOSE-CAPILLARY: 140 mg/dL — AB (ref 65–99)
GLUCOSE-CAPILLARY: 153 mg/dL — AB (ref 65–99)
GLUCOSE-CAPILLARY: 156 mg/dL — AB (ref 65–99)
GLUCOSE-CAPILLARY: 178 mg/dL — AB (ref 65–99)
GLUCOSE-CAPILLARY: 185 mg/dL — AB (ref 65–99)
GLUCOSE-CAPILLARY: 187 mg/dL — AB (ref 65–99)
GLUCOSE-CAPILLARY: 190 mg/dL — AB (ref 65–99)
GLUCOSE-CAPILLARY: 197 mg/dL — AB (ref 65–99)
Glucose-Capillary: 118 mg/dL — ABNORMAL HIGH (ref 65–99)
Glucose-Capillary: 124 mg/dL — ABNORMAL HIGH (ref 65–99)
Glucose-Capillary: 131 mg/dL — ABNORMAL HIGH (ref 65–99)
Glucose-Capillary: 133 mg/dL — ABNORMAL HIGH (ref 65–99)
Glucose-Capillary: 137 mg/dL — ABNORMAL HIGH (ref 65–99)
Glucose-Capillary: 155 mg/dL — ABNORMAL HIGH (ref 65–99)
Glucose-Capillary: 158 mg/dL — ABNORMAL HIGH (ref 65–99)
Glucose-Capillary: 159 mg/dL — ABNORMAL HIGH (ref 65–99)
Glucose-Capillary: 180 mg/dL — ABNORMAL HIGH (ref 65–99)
Glucose-Capillary: 187 mg/dL — ABNORMAL HIGH (ref 65–99)
Glucose-Capillary: 206 mg/dL — ABNORMAL HIGH (ref 65–99)
Glucose-Capillary: 215 mg/dL — ABNORMAL HIGH (ref 65–99)

## 2016-02-21 LAB — CBC
HCT: 33.8 % — ABNORMAL LOW (ref 36.0–46.0)
HEMOGLOBIN: 10.9 g/dL — AB (ref 12.0–15.0)
MCH: 29.2 pg (ref 26.0–34.0)
MCHC: 32.2 g/dL (ref 30.0–36.0)
MCV: 90.6 fL (ref 78.0–100.0)
Platelets: 422 10*3/uL — ABNORMAL HIGH (ref 150–400)
RBC: 3.73 MIL/uL — ABNORMAL LOW (ref 3.87–5.11)
RDW: 14.8 % (ref 11.5–15.5)
WBC: 10 10*3/uL (ref 4.0–10.5)

## 2016-02-21 LAB — HEMOGLOBIN A1C
Hgb A1c MFr Bld: 9.6 % — ABNORMAL HIGH (ref 4.8–5.6)
MEAN PLASMA GLUCOSE: 229 mg/dL

## 2016-02-21 MED ORDER — SODIUM CHLORIDE 0.9 % IV BOLUS (SEPSIS)
500.0000 mL | Freq: Once | INTRAVENOUS | Status: DC
Start: 2016-02-21 — End: 2016-02-25

## 2016-02-21 MED ORDER — SODIUM BICARBONATE 650 MG PO TABS: 650.0000 mg | ORAL_TABLET | Freq: Two times a day (BID) | ORAL | Status: DC

## 2016-02-21 MED ORDER — METOCLOPRAMIDE HCL 5 MG/ML IJ SOLN
5.0000 mg | Freq: Four times a day (QID) | INTRAMUSCULAR | Status: DC
  Administered 2016-02-21 – 2016-02-23 (×10): 5 mg via INTRAVENOUS
  Filled 2016-02-21 (×10): qty 2

## 2016-02-21 MED ORDER — POTASSIUM CHLORIDE 2 MEQ/ML IV SOLN
30.0000 meq | Freq: Once | INTRAVENOUS | Status: AC
  Administered 2016-02-21: 30 meq via INTRAVENOUS
  Filled 2016-02-21: qty 15

## 2016-02-21 MED ORDER — SODIUM CHLORIDE 0.9 % IV SOLN
30.0000 meq | INTRAVENOUS | Status: AC
  Administered 2016-02-21 – 2016-02-22 (×2): 30 meq via INTRAVENOUS
  Filled 2016-02-21 (×2): qty 15

## 2016-02-21 MED ORDER — ALBUTEROL SULFATE (2.5 MG/3ML) 0.083% IN NEBU: 2.5000 mg | INHALATION_SOLUTION | RESPIRATORY_TRACT | Status: DC | PRN

## 2016-02-21 MED ORDER — ONDANSETRON HCL 4 MG/2ML IJ SOLN
4.0000 mg | Freq: Four times a day (QID) | INTRAMUSCULAR | Status: DC | PRN
  Administered 2016-02-21 – 2016-02-24 (×7): 4 mg via INTRAVENOUS
  Filled 2016-02-21 (×7): qty 2

## 2016-02-21 MED ORDER — HYDRALAZINE HCL 20 MG/ML IJ SOLN
10.0000 mg | Freq: Four times a day (QID) | INTRAMUSCULAR | Status: DC | PRN
  Administered 2016-02-23: 10 mg via INTRAVENOUS
  Filled 2016-02-21 (×2): qty 1

## 2016-02-21 MED ORDER — METOCLOPRAMIDE HCL 5 MG/ML IJ SOLN: 5.0000 mg | Freq: Four times a day (QID) | INTRAMUSCULAR | Status: DC

## 2016-02-21 MED ORDER — SODIUM CHLORIDE 0.9 % IV BOLUS (SEPSIS)
500.0000 mL | Freq: Once | INTRAVENOUS | Status: AC
  Administered 2016-02-21: 500 mL via INTRAVENOUS

## 2016-02-21 MED ORDER — SODIUM BICARBONATE 650 MG PO TABS: 1300.0000 mg | ORAL_TABLET | Freq: Two times a day (BID) | ORAL | Status: DC

## 2016-02-21 MED ORDER — POTASSIUM CHLORIDE CRYS ER 20 MEQ PO TBCR: 20.0000 meq | EXTENDED_RELEASE_TABLET | Freq: Once | ORAL | Status: DC

## 2016-02-21 NOTE — Progress Notes (Addendum)
Inpatient Diabetes Program Recommendations  AACE/ADA: New Consensus Statement on Inpatient Glycemic Control (2015)  Target Ranges:  Prepandial:   less than 140 mg/dL      Peak postprandial:   less than 180 mg/dL (1-2 hours)      Critically ill patients:  140 - 180 mg/dL   Lab Results  Component Value Date   GLUCAP 187 (H) 02/21/2016   HGBA1C 9.6 (H) 02/20/2016    Review of Glycemic Control  6th Admission in the past 6 months for DKA  Diabetes history: DM1 Outpatient Diabetes medications: Lantus 26 units QHS, Novolog 1:10 CHO ratio, 1 units for 50 above 150 mg/dL Current orders for Inpatient glycemic control: IV insulin per DKA Glycemic Control.  CO2 - 15. Not ready for transition.  -Patient saw her Endocrinologist (Dr. Francetta Found with [facility] in [place]) on 12/27/15. At that visit pt was given the following instructions:  Patient Instructions - Gerome Apley, MD - 12/27/2015 10:14 AM EST 1. Lower Basaglar Kwikpen from 20 to 16 units at bedtime. Can raise back up to 18 units if fasting blood sugars above 120. Will switch to Lantus Solostar when you run out of Basaglar. 2. Continue Novolog Flexpen 1 unit per 10 grams carbohydrates plus sliding scale. 3. Try to limit meals to 45-60 grams carbohydrates and snacks to under 15 grams. 4. Check blood sugars before and 1-2 hours after each meal. Bring glucose logs and meter to each visit.    Will follow. Thank you. Lorenda Peck, RD, LDN, CDE Inpatient Diabetes Coordinator [phone]

## 2016-02-21 NOTE — Progress Notes (Signed)
[facility] Progress Note  Subjective: From from her patient asleep and I did not rales. Spelled with her nurse who says she is continuing to complain of intermittent nausea and has vomited today with the appearance of green bile without coffee grounds. She is complaining of generalized pain and receiving morphine. She had urinary retention and had bladder catheter was 1700 mL of urine retrieved. No stools since admission.  Objective: Vital signs in last 24 hours: Temp:  [97.9 F (36.6 C)-99.1 F (37.3 C)] 99.1 F (37.3 C) (02/02 1200) Pulse Rate:  [105-139] 115 (02/02 1540) Resp:  [13-30] 19 (02/02 1540) BP: (106-172)/(46-117) 136/87 (02/02 1540) SpO2:  [98 %-100 %] 100 % (02/02 1540) Weight change:    PE: Deferred  Lab Results: Results for orders placed or performed during the hospital encounter of 02/20/16 (from the past 24 hour(s))  Glucose, capillary     Status: None   Collection Time: 02/20/16  4:05 PM  Result Value Ref Range   Glucose-Capillary 96 65 - 99 mg/dL  Glucose, capillary     Status: Abnormal   Collection Time: 02/20/16  5:08 PM  Result Value Ref Range   Glucose-Capillary 193 (H) 65 - 99 mg/dL  Glucose, capillary     Status: Abnormal   Collection Time: 02/20/16  6:12 PM  Result Value Ref Range   Glucose-Capillary 170 (H) 65 - 99 mg/dL  Glucose, capillary     Status: Abnormal   Collection Time: 02/20/16  7:20 PM  Result Value Ref Range   Glucose-Capillary 141 (H) 65 - 99 mg/dL  Basic metabolic panel     Status: Abnormal   Collection Time: 02/20/16  7:40 PM  Result Value Ref Range   Sodium 138 135 - 145 mmol/L   Potassium 4.0 3.5 - 5.1 mmol/L   Chloride 116 (H) 101 - 111 mmol/L   CO2 16 (L) 22 - 32 mmol/L   Glucose, Bld 134 (H) 65 - 99 mg/dL   BUN 10 6 - 20 mg/dL   Creatinine, Ser 0.56 0.44 - 1.00 mg/dL   Calcium 7.4 (L) 8.9 - 10.3 mg/dL   GFR calc non Af Amer >60 >60 mL/min   GFR calc Af Amer >60 >60 mL/min   Anion gap 6 5 - 15  Glucose,  capillary     Status: Abnormal   Collection Time: 02/20/16  8:24 PM  Result Value Ref Range   Glucose-Capillary 118 (H) 65 - 99 mg/dL  Glucose, capillary     Status: Abnormal   Collection Time: 02/20/16  9:29 PM  Result Value Ref Range   Glucose-Capillary 153 (H) 65 - 99 mg/dL  Glucose, capillary     Status: Abnormal   Collection Time: 02/20/16 10:13 PM  Result Value Ref Range   Glucose-Capillary 150 (H) 65 - 99 mg/dL  Basic metabolic panel     Status: Abnormal   Collection Time: 02/20/16 11:30 PM  Result Value Ref Range   Sodium 137 135 - 145 mmol/L   Potassium 3.7 3.5 - 5.1 mmol/L   Chloride 114 (H) 101 - 111 mmol/L   CO2 15 (L) 22 - 32 mmol/L   Glucose, Bld 134 (H) 65 - 99 mg/dL   BUN 9 6 - 20 mg/dL   Creatinine, Ser 0.60 0.44 - 1.00 mg/dL   Calcium 7.9 (L) 8.9 - 10.3 mg/dL   GFR calc non Af Amer >60 >60 mL/min   GFR calc Af Amer >60 >60 mL/min   Anion gap 8 5 -  15  Glucose, capillary     Status: Abnormal   Collection Time: 02/20/16 11:43 PM  Result Value Ref Range   Glucose-Capillary 118 (H) 65 - 99 mg/dL  Glucose, capillary     Status: Abnormal   Collection Time: 02/21/16 12:46 AM  Result Value Ref Range   Glucose-Capillary 215 (H) 65 - 99 mg/dL   Comment 1 Notify RN    Comment 2 Document in Chart   Glucose, capillary     Status: Abnormal   Collection Time: 02/21/16  1:49 AM  Result Value Ref Range   Glucose-Capillary 197 (H) 65 - 99 mg/dL   Comment 1 Notify RN    Comment 2 Document in Chart   Glucose, capillary     Status: Abnormal   Collection Time: 02/21/16  2:58 AM  Result Value Ref Range   Glucose-Capillary 131 (H) 65 - 99 mg/dL  CBC     Status: Abnormal   Collection Time: 02/21/16  3:35 AM  Result Value Ref Range   WBC 10.0 4.0 - 10.5 K/uL   RBC 3.73 (L) 3.87 - 5.11 MIL/uL   Hemoglobin 10.9 (L) 12.0 - 15.0 g/dL   HCT 33.8 (L) 36.0 - 46.0 %   MCV 90.6 78.0 - 100.0 fL   MCH 29.2 26.0 - 34.0 pg   MCHC 32.2 30.0 - 36.0 g/dL   RDW 14.8 11.5 - 15.5 %    Platelets 422 (H) 150 - 400 K/uL  Basic metabolic panel     Status: Abnormal   Collection Time: 02/21/16  3:35 AM  Result Value Ref Range   Sodium 138 135 - 145 mmol/L   Potassium 3.6 3.5 - 5.1 mmol/L   Chloride 113 (H) 101 - 111 mmol/L   CO2 16 (L) 22 - 32 mmol/L   Glucose, Bld 134 (H) 65 - 99 mg/dL   BUN 8 6 - 20 mg/dL   Creatinine, Ser 0.73 0.44 - 1.00 mg/dL   Calcium 8.2 (L) 8.9 - 10.3 mg/dL   GFR calc non Af Amer >60 >60 mL/min   GFR calc Af Amer >60 >60 mL/min   Anion gap 9 5 - 15  Glucose, capillary     Status: Abnormal   Collection Time: 02/21/16  4:13 AM  Result Value Ref Range   Glucose-Capillary 206 (H) 65 - 99 mg/dL   Comment 1 Notify RN    Comment 2 Document in Chart   Glucose, capillary     Status: Abnormal   Collection Time: 02/21/16  5:30 AM  Result Value Ref Range   Glucose-Capillary 187 (H) 65 - 99 mg/dL  Glucose, capillary     Status: Abnormal   Collection Time: 02/21/16  6:34 AM  Result Value Ref Range   Glucose-Capillary 155 (H) 65 - 99 mg/dL   Comment 1 Notify RN    Comment 2 Document in Chart   Glucose, capillary     Status: Abnormal   Collection Time: 02/21/16  7:42 AM  Result Value Ref Range   Glucose-Capillary 127 (H) 65 - 99 mg/dL  Basic metabolic panel     Status: Abnormal   Collection Time: 02/21/16  8:18 AM  Result Value Ref Range   Sodium 135 135 - 145 mmol/L   Potassium 3.4 (L) 3.5 - 5.1 mmol/L   Chloride 108 101 - 111 mmol/L   CO2 16 (L) 22 - 32 mmol/L   Glucose, Bld 139 (H) 65 - 99 mg/dL   BUN 6 6 - 20 mg/dL  Creatinine, Ser 0.52 0.44 - 1.00 mg/dL   Calcium 8.5 (L) 8.9 - 10.3 mg/dL   GFR calc non Af Amer >60 >60 mL/min   GFR calc Af Amer >60 >60 mL/min   Anion gap 11 5 - 15  Glucose, capillary     Status: Abnormal   Collection Time: 02/21/16  8:53 AM  Result Value Ref Range   Glucose-Capillary 159 (H) 65 - 99 mg/dL  Glucose, capillary     Status: Abnormal   Collection Time: 02/21/16 10:24 AM  Result Value Ref Range    Glucose-Capillary 115 (H) 65 - 99 mg/dL  Glucose, capillary     Status: Abnormal   Collection Time: 02/21/16 11:46 AM  Result Value Ref Range   Glucose-Capillary 133 (H) 65 - 99 mg/dL  Basic metabolic panel     Status: Abnormal   Collection Time: 02/21/16 12:16 PM  Result Value Ref Range   Sodium 135 135 - 145 mmol/L   Potassium 3.8 3.5 - 5.1 mmol/L   Chloride 109 101 - 111 mmol/L   CO2 15 (L) 22 - 32 mmol/L   Glucose, Bld 180 (H) 65 - 99 mg/dL   BUN <5 (L) 6 - 20 mg/dL   Creatinine, Ser 0.48 0.44 - 1.00 mg/dL   Calcium 7.9 (L) 8.9 - 10.3 mg/dL   GFR calc non Af Amer >60 >60 mL/min   GFR calc Af Amer >60 >60 mL/min   Anion gap 11 5 - 15  Glucose, capillary     Status: Abnormal   Collection Time: 02/21/16  1:04 PM  Result Value Ref Range   Glucose-Capillary 187 (H) 65 - 99 mg/dL   Comment 1 Notify RN    Comment 2 Document in Chart   Glucose, capillary     Status: Abnormal   Collection Time: 02/21/16  2:12 PM  Result Value Ref Range   Glucose-Capillary 140 (H) 65 - 99 mg/dL  Glucose, capillary     Status: Abnormal   Collection Time: 02/21/16  3:17 PM  Result Value Ref Range   Glucose-Capillary 158 (H) 65 - 99 mg/dL    Studies/Results: Ct Abdomen Pelvis Wo Contrast  Result Date: 02/20/2016 CLINICAL DATA:  Diabetic patient with abdominal pain coffee ground emesis today. Recent diagnosis of pancreatitis. EXAM: CT ABDOMEN AND PELVIS WITHOUT CONTRAST TECHNIQUE: Multidetector CT imaging of the abdomen and pelvis was performed following the standard protocol without IV contrast. COMPARISON:  CT abdomen and pelvis 11/02/2015 and 04/21/2015. MRI abdomen 11/27/2014. FINDINGS: Lower chest: Lung bases are clear. No pleural or pericardial effusion. Hepatobiliary: Status post cholecystectomy. Three low attenuating lesions in the liver measuring 1.8 cm on, 1.5 cm on image 16 image 23 and 2.5 cm on image 35 are unchanged and compatible with adenomas based on prior imaging. Pancreas: Unremarkable.  No pancreatic ductal dilatation or surrounding inflammatory changes. Spleen: Normal in size without focal abnormality. Adrenals/Urinary Tract: The adrenal glands are unremarkable. Mild right hydronephrosis is presumably due to marked bladder distention which is present on the prior exams. The left kidney appears normal. No urinary tract stone is identified. Stomach/Bowel: Stomach is within normal limits. Appendix appears normal. No evidence of bowel wall thickening, distention, or inflammatory changes. Vascular/Lymphatic: No significant vascular findings are present. No enlarged abdominal or pelvic lymph nodes. Reproductive: Uterus and bilateral adnexa are unremarkable. Other: No abdominal wall hernia or abnormality. No abdominopelvic ascites. Musculoskeletal: Negative. IMPRESSION: No acute abnormality. Mild right hydronephrosis is presumably due to marked distention of the urinary bladder,  a chronic abnormality. Cause for distention is not identified. No change in 3 liver lesions presumably representing adenomas based on prior exams. Electronically Signed   By: Inge Rise M.D.   On: 02/20/2016 12:29   Dg Chest Port 1 View  Result Date: 02/20/2016 CLINICAL DATA:  Nausea and vomiting. EXAM: PORTABLE CHEST 1 VIEW COMPARISON:  01/17/2016 FINDINGS: The cardiomediastinal contours are normal. Bronchial thickening. Pulmonary vasculature is normal. No consolidation, pleural effusion, or pneumothorax. No acute osseous abnormalities are seen. IMPRESSION: Bronchial thickening.  No focal airspace consolidation Electronically Signed   By: Jeb Levering M.D.   On: 02/20/2016 04:40      Assessment: Diabetic ketoacidosis Nausea and vomiting, initial suspicion of GI bleeding which would not appear to be significant at this point Generalized abdominal and overall body pain  Plan: Overall GI presentation appears nonspecific and possibly secondary to DKA Recommend empiric treatment with PPI, anti-medics as  needed, no need for endoscopy unless persistent intractable nausea and vomiting. Will follow at a distance. Please call if further GI input needed.    Alba Kriesel C 02/21/2016, 3:46 PM  Pager [phone] If no answer or after 5 PM call [phone]

## 2016-02-21 NOTE — Consult Note (Signed)
Urology Consult   Physician requesting consult: Dr. Niel Hummer  Reason for consult: urinary retention  History of Present Illness: [name] is a 26 y.o. with history of diabetes mellitus type 1 which is poor controlled. She was admitted with DKA yesterday. She was found to have a very distended bladder with mild right hydronephrosis down to her bladder on CT imaging. In addition, her UA was consistent with a UTI. A foley catheter was placed and urine culture sent. She was started on Ceftriaxone for the UTI with urine culture still pending. Creatinine has improved from 0.95 on admission to 0.48. She denies dysuria, hematuria, urolithiasis, GU malignancy/trauma/surgery. She does report a history of UTIs but is always asymptomatic and she is typically told she has a UTI after bacteria is found in the urine.    Past Medical History:  Diagnosis Date  . Anxiety   . Arthritis   . Asthma   . Diabetes mellitus 2007   IDDM.  poorly controlled, multiple admits with DKA  . Gallstones   . Heart murmur   . Hepatic steatosis 11/26/2014   and hepatomegaly  . Hypertension    NOT CURRENTLY ON ANY BP MED  . Liver mass 11/26/2014  . Pancreatitis, acute 11/26/2014    Past Surgical History:  Procedure Laterality Date  . CHOLECYSTECTOMY N/A 02/11/2015   Procedure: LAPAROSCOPIC CHOLECYSTECTOMY WITH INTRAOPERATIVE CHOLANGIOGRAM;  Surgeon: Greer Pickerel, MD;  Location: [facility];  Service: General;  Laterality: N/A;  . ESOPHAGOGASTRODUODENOSCOPY (EGD) WITH PROPOFOL Left 09/20/2014   Procedure: ESOPHAGOGASTRODUODENOSCOPY (EGD) WITH PROPOFOL;  Surgeon: Arta Silence, MD;  Location: [facility] ENDOSCOPY;  Service: Endoscopy;  Laterality: Left;  . WISDOM TOOTH EXTRACTION       Current Hospital Medications:  Scheduled Meds: . cefTRIAXone (ROCEPHIN)  IV  1 g Intravenous Q24H  . escitalopram  5 mg Oral Daily  . famotidine  20 mg Oral QHS  . insulin regular  0-10 Units Intravenous TID WC  . metoCLOPramide  (REGLAN) injection  5 mg Intravenous Q6H  . mometasone-formoterol  2 puff Inhalation BID  . montelukast  10 mg Oral Daily  . [START ON 02/23/2016] pantoprazole  40 mg Intravenous Q12H  . potassium chloride  20 mEq Oral Daily  . sodium chloride  500 mL Intravenous Once   Continuous Infusions: . dextrose 5 % and 0.45% NaCl 125 mL/hr at 02/21/16 1200  . insulin (NOVOLIN-R) infusion 2.5 Units/hr (02/21/16 1309)  . pantoprozole (PROTONIX) infusion 8 mg/hr (02/21/16 1211)   PRN Meds:.albuterol, dextrose, morphine injection, ondansetron (ZOFRAN) IV, promethazine  Allergies:  Allergies  Allergen Reactions  . Peanut-Containing Drug Products Swelling    Swelling of mouth, lips  . Strawberry Extract Swelling    Swelling of mouth, lips  . Ultram [Tramadol] Itching    Family History  Problem Relation Age of Onset  . Heart disease Maternal Grandmother   . Heart disease Maternal Grandfather   . Diabetes Mother   . Hyperlipidemia Mother   . Hypertension Father   . Heart disease Father   . Hypertension Paternal Grandmother   . Cancer Paternal Grandfather     Social History:  reports that she has never smoked. She has never used smokeless tobacco. She reports that she uses drugs, including Marijuana. She reports that she does not drink alcohol.  ROS: A complete review of systems was performed.  All systems are negative except for pertinent findings as noted.  Physical Exam:  Vital signs in last 24 hours: Temp:  [97.9 F (36.6  C)-99.1 F (37.3 C)] 99.1 F (37.3 C) (02/02 1200) Pulse Rate:  [105-139] 123 (02/02 1200) Resp:  [13-30] 27 (02/02 1200) BP: (106-168)/(46-110) 168/101 (02/02 1200) SpO2:  [98 %-100 %] 99 % (02/02 1200) Constitutional:  Alert and oriented, No acute distress Cardiovascular: tachycardic on monitor Respiratory: Normal respiratory effort  GI: Abdomen is soft, mild diffuse tenderness, nondistended, no abdominal masses GU: foley catheter in place draining clear  yellow urine Neurologic: Grossly intact, no focal deficits Psychiatric: Normal mood and affect  Laboratory Data:   Recent Labs  02/20/16 0253 02/20/16 1400 02/21/16 0335  WBC 12.4*  --  10.0  HGB 13.3 11.6* 10.9*  HCT 42.9 34.8* 33.8*  PLT 567*  --  422*     Recent Labs  02/20/16 1940 02/20/16 2330 02/21/16 0335 02/21/16 0818 02/21/16 1216  NA 138 137 138 135 135  K 4.0 3.7 3.6 3.4* 3.8  CL 116* 114* 113* 108 109  GLUCOSE 134* 134* 134* 139* 180*  BUN 10 9 8 6  <5*  CALCIUM 7.4* 7.9* 8.2* 8.5* 7.9*  CREATININE 0.56 0.60 0.73 0.52 0.48     Results for orders placed or performed during the hospital encounter of 02/20/16 (from the past 24 hour(s))  CBG monitoring, ED     Status: Abnormal   Collection Time: 02/20/16  1:59 PM  Result Value Ref Range   Glucose-Capillary 153 (H) 65 - 99 mg/dL  Hemoglobin and hematocrit, blood     Status: Abnormal   Collection Time: 02/20/16  2:00 PM  Result Value Ref Range   Hemoglobin 11.6 (L) 12.0 - 15.0 g/dL   HCT 34.8 (L) 36.0 - [id number] %  Basic metabolic panel     Status: Abnormal   Collection Time: 02/20/16  2:00 PM  Result Value Ref Range   Sodium 139 135 - 145 mmol/L   Potassium 4.0 3.5 - 5.1 mmol/L   Chloride 111 101 - 111 mmol/L   CO2 15 (L) 22 - 32 mmol/L   Glucose, Bld 126 (H) 65 - 99 mg/dL   BUN 15 6 - 20 mg/dL   Creatinine, Ser 0.72 0.44 - 1.00 mg/dL   Calcium 8.5 (L) 8.9 - 10.3 mg/dL   GFR calc non Af Amer >60 >60 mL/min   GFR calc Af Amer >60 >60 mL/min   Anion gap 13 5 - 15  Hemoglobin A1c     Status: Abnormal   Collection Time: 02/20/16  2:00 PM  Result Value Ref Range   Hgb A1c MFr Bld 9.6 (H) 4.8 - 5.6 %   Mean Plasma Glucose 229 mg/dL  Blood gas, venous     Status: Abnormal   Collection Time: 02/20/16  2:25 PM  Result Value Ref Range   FIO2 21.00    pH, Ven 7.279 7.250 - 7.430   pCO2, Ven 31.3 (L) 44.0 - 60.0 mmHg   pO2, Ven 59.8 (H) 32.0 - 45.0 mmHg   Bicarbonate 14.2 (L) 20.0 - 28.0 mmol/L    Acid-base deficit 11.2 (H) 0.0 - 2.0 mmol/L   O2 Saturation 88.7 %   Patient temperature 98.6    Collection site VEIN    Drawn by COLLECTED BY NURSE    Sample type VENOUS   MRSA PCR Screening     Status: None   Collection Time: 02/20/16  2:40 PM  Result Value Ref Range   MRSA by PCR NEGATIVE NEGATIVE  Glucose, capillary     Status: Abnormal   Collection Time: 02/20/16  2:55 PM  Result Value Ref Range   Glucose-Capillary 100 (H) 65 - 99 mg/dL  Glucose, capillary     Status: None   Collection Time: 02/20/16  4:05 PM  Result Value Ref Range   Glucose-Capillary 96 65 - 99 mg/dL  Glucose, capillary     Status: Abnormal   Collection Time: 02/20/16  5:08 PM  Result Value Ref Range   Glucose-Capillary 193 (H) 65 - 99 mg/dL  Glucose, capillary     Status: Abnormal   Collection Time: 02/20/16  6:12 PM  Result Value Ref Range   Glucose-Capillary 170 (H) 65 - 99 mg/dL  Glucose, capillary     Status: Abnormal   Collection Time: 02/20/16  7:20 PM  Result Value Ref Range   Glucose-Capillary 141 (H) 65 - 99 mg/dL  Basic metabolic panel     Status: Abnormal   Collection Time: 02/20/16  7:40 PM  Result Value Ref Range   Sodium 138 135 - 145 mmol/L   Potassium 4.0 3.5 - 5.1 mmol/L   Chloride 116 (H) 101 - 111 mmol/L   CO2 16 (L) 22 - 32 mmol/L   Glucose, Bld 134 (H) 65 - 99 mg/dL   BUN 10 6 - 20 mg/dL   Creatinine, Ser 0.56 0.44 - 1.00 mg/dL   Calcium 7.4 (L) 8.9 - 10.3 mg/dL   GFR calc non Af Amer >60 >60 mL/min   GFR calc Af Amer >60 >60 mL/min   Anion gap 6 5 - 15  Glucose, capillary     Status: Abnormal   Collection Time: 02/20/16  8:24 PM  Result Value Ref Range   Glucose-Capillary 118 (H) 65 - 99 mg/dL  Glucose, capillary     Status: Abnormal   Collection Time: 02/20/16  9:29 PM  Result Value Ref Range   Glucose-Capillary 153 (H) 65 - 99 mg/dL  Glucose, capillary     Status: Abnormal   Collection Time: 02/20/16 10:13 PM  Result Value Ref Range   Glucose-Capillary 150 (H) 65  - 99 mg/dL  Basic metabolic panel     Status: Abnormal   Collection Time: 02/20/16 11:30 PM  Result Value Ref Range   Sodium 137 135 - 145 mmol/L   Potassium 3.7 3.5 - 5.1 mmol/L   Chloride 114 (H) 101 - 111 mmol/L   CO2 15 (L) 22 - 32 mmol/L   Glucose, Bld 134 (H) 65 - 99 mg/dL   BUN 9 6 - 20 mg/dL   Creatinine, Ser 0.60 0.44 - 1.00 mg/dL   Calcium 7.9 (L) 8.9 - 10.3 mg/dL   GFR calc non Af Amer >60 >60 mL/min   GFR calc Af Amer >60 >60 mL/min   Anion gap 8 5 - 15  Glucose, capillary     Status: Abnormal   Collection Time: 02/20/16 11:43 PM  Result Value Ref Range   Glucose-Capillary 118 (H) 65 - 99 mg/dL  Glucose, capillary     Status: Abnormal   Collection Time: 02/21/16 12:46 AM  Result Value Ref Range   Glucose-Capillary 215 (H) 65 - 99 mg/dL   Comment 1 Notify RN    Comment 2 Document in Chart   Glucose, capillary     Status: Abnormal   Collection Time: 02/21/16  1:49 AM  Result Value Ref Range   Glucose-Capillary 197 (H) 65 - 99 mg/dL   Comment 1 Notify RN    Comment 2 Document in Chart   Glucose, capillary     Status: Abnormal  Collection Time: 02/21/16  2:58 AM  Result Value Ref Range   Glucose-Capillary 131 (H) 65 - 99 mg/dL  CBC     Status: Abnormal   Collection Time: 02/21/16  3:35 AM  Result Value Ref Range   WBC 10.0 4.0 - 10.5 K/uL   RBC 3.73 (L) 3.87 - 5.11 MIL/uL   Hemoglobin 10.9 (L) 12.0 - 15.0 g/dL   HCT 33.8 (L) 36.0 - 46.0 %   MCV 90.6 78.0 - 100.0 fL   MCH 29.2 26.0 - 34.0 pg   MCHC 32.2 30.0 - 36.0 g/dL   RDW 14.8 11.5 - 15.5 %   Platelets 422 (H) 150 - 400 K/uL  Basic metabolic panel     Status: Abnormal   Collection Time: 02/21/16  3:35 AM  Result Value Ref Range   Sodium 138 135 - 145 mmol/L   Potassium 3.6 3.5 - 5.1 mmol/L   Chloride 113 (H) 101 - 111 mmol/L   CO2 16 (L) 22 - 32 mmol/L   Glucose, Bld 134 (H) 65 - 99 mg/dL   BUN 8 6 - 20 mg/dL   Creatinine, Ser 0.73 0.44 - 1.00 mg/dL   Calcium 8.2 (L) 8.9 - 10.3 mg/dL   GFR calc  non Af Amer >60 >60 mL/min   GFR calc Af Amer >60 >60 mL/min   Anion gap 9 5 - 15  Glucose, capillary     Status: Abnormal   Collection Time: 02/21/16  4:13 AM  Result Value Ref Range   Glucose-Capillary 206 (H) 65 - 99 mg/dL   Comment 1 Notify RN    Comment 2 Document in Chart   Glucose, capillary     Status: Abnormal   Collection Time: 02/21/16  5:30 AM  Result Value Ref Range   Glucose-Capillary 187 (H) 65 - 99 mg/dL  Glucose, capillary     Status: Abnormal   Collection Time: 02/21/16  6:34 AM  Result Value Ref Range   Glucose-Capillary 155 (H) 65 - 99 mg/dL   Comment 1 Notify RN    Comment 2 Document in Chart   Glucose, capillary     Status: Abnormal   Collection Time: 02/21/16  7:42 AM  Result Value Ref Range   Glucose-Capillary 127 (H) 65 - 99 mg/dL  Basic metabolic panel     Status: Abnormal   Collection Time: 02/21/16  8:18 AM  Result Value Ref Range   Sodium 135 135 - 145 mmol/L   Potassium 3.4 (L) 3.5 - 5.1 mmol/L   Chloride 108 101 - 111 mmol/L   CO2 16 (L) 22 - 32 mmol/L   Glucose, Bld 139 (H) 65 - 99 mg/dL   BUN 6 6 - 20 mg/dL   Creatinine, Ser 0.52 0.44 - 1.00 mg/dL   Calcium 8.5 (L) 8.9 - 10.3 mg/dL   GFR calc non Af Amer >60 >60 mL/min   GFR calc Af Amer >60 >60 mL/min   Anion gap 11 5 - 15  Glucose, capillary     Status: Abnormal   Collection Time: 02/21/16  8:53 AM  Result Value Ref Range   Glucose-Capillary 159 (H) 65 - 99 mg/dL  Glucose, capillary     Status: Abnormal   Collection Time: 02/21/16 10:24 AM  Result Value Ref Range   Glucose-Capillary 115 (H) 65 - 99 mg/dL  Basic metabolic panel     Status: Abnormal   Collection Time: 02/21/16 12:16 PM  Result Value Ref Range   Sodium 135 135 -  145 mmol/L   Potassium 3.8 3.5 - 5.1 mmol/L   Chloride 109 101 - 111 mmol/L   CO2 15 (L) 22 - 32 mmol/L   Glucose, Bld 180 (H) 65 - 99 mg/dL   BUN <5 (L) 6 - 20 mg/dL   Creatinine, Ser 0.48 0.44 - 1.00 mg/dL   Calcium 7.9 (L) 8.9 - 10.3 mg/dL   GFR calc  non Af Amer >60 >60 mL/min   GFR calc Af Amer >60 >60 mL/min   Anion gap 11 5 - 15  Glucose, capillary     Status: Abnormal   Collection Time: 02/21/16  1:04 PM  Result Value Ref Range   Glucose-Capillary 187 (H) 65 - 99 mg/dL   Comment 1 Notify RN    Comment 2 Document in Chart    Recent Results (from the past 240 hour(s))  MRSA PCR Screening     Status: None   Collection Time: 02/20/16  2:40 PM  Result Value Ref Range Status   MRSA by PCR NEGATIVE NEGATIVE Final    Comment:        The GeneXpert MRSA Assay (FDA approved for NASAL specimens only), is one component of a comprehensive MRSA colonization surveillance program. It is not intended to diagnose MRSA infection nor to guide or monitor treatment for MRSA infections.     Renal Function:  Recent Labs  02/20/16 0253 02/20/16 1400 02/20/16 1940 02/20/16 2330 02/21/16 0335 02/21/16 0818 02/21/16 1216  CREATININE 0.95 0.72 0.56 0.60 0.73 0.52 0.48   Estimated Creatinine Clearance: 88.5 mL/min (by C-G formula based on SCr of 0.48 mg/dL).  Radiologic Imaging: Ct Abdomen Pelvis Wo Contrast  Result Date: 02/20/2016 CLINICAL DATA:  Diabetic patient with abdominal pain coffee ground emesis today. Recent diagnosis of pancreatitis. EXAM: CT ABDOMEN AND PELVIS WITHOUT CONTRAST TECHNIQUE: Multidetector CT imaging of the abdomen and pelvis was performed following the standard protocol without IV contrast. COMPARISON:  CT abdomen and pelvis 11/02/2015 and 04/21/2015. MRI abdomen 11/27/2014. FINDINGS: Lower chest: Lung bases are clear. No pleural or pericardial effusion. Hepatobiliary: Status post cholecystectomy. Three low attenuating lesions in the liver measuring 1.8 cm on, 1.5 cm on image 16 image 23 and 2.5 cm on image 35 are unchanged and compatible with adenomas based on prior imaging. Pancreas: Unremarkable. No pancreatic ductal dilatation or surrounding inflammatory changes. Spleen: Normal in size without focal abnormality.  Adrenals/Urinary Tract: The adrenal glands are unremarkable. Mild right hydronephrosis is presumably due to marked bladder distention which is present on the prior exams. The left kidney appears normal. No urinary tract stone is identified. Stomach/Bowel: Stomach is within normal limits. Appendix appears normal. No evidence of bowel wall thickening, distention, or inflammatory changes. Vascular/Lymphatic: No significant vascular findings are present. No enlarged abdominal or pelvic lymph nodes. Reproductive: Uterus and bilateral adnexa are unremarkable. Other: No abdominal wall hernia or abnormality. No abdominopelvic ascites. Musculoskeletal: Negative. IMPRESSION: No acute abnormality. Mild right hydronephrosis is presumably due to marked distention of the urinary bladder, a chronic abnormality. Cause for distention is not identified. No change in 3 liver lesions presumably representing adenomas based on prior exams. Electronically Signed   By: Inge Rise M.D.   On: 02/20/2016 12:29   Dg Chest Port 1 View  Result Date: 02/20/2016 CLINICAL DATA:  Nausea and vomiting. EXAM: PORTABLE CHEST 1 VIEW COMPARISON:  01/17/2016 FINDINGS: The cardiomediastinal contours are normal. Bronchial thickening. Pulmonary vasculature is normal. No consolidation, pleural effusion, or pneumothorax. No acute osseous abnormalities are seen. IMPRESSION: Bronchial  thickening.  No focal airspace consolidation Electronically Signed   By: Jeb Levering M.D.   On: 02/20/2016 04:40    I independently reviewed the above imaging studies.  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 She will need to have her urinary retention managed going forward to prevent renal injury from reflux. The options are foley catheter drainage, suprapubic catheter drainage or clean intermittent catheterization. All three were  discussed with the patient. Ideally, she should learn clean intermittent catheterization as this will allow her to continue voiding spontaneously and minimize the risk of UTI. The rationale for this was discussed with her,  1) Her right hydronephrosis is almost assuredly due to her bladder distension and should resolve with drainage. 2) Recommend patient learns how to self catheterize every 6 hours during the day, or more frequently to keep volumes <563ml. This can be deferred until she is not diuresing as much.  If this is not feasible in the inpatient setting, she can be discharged with foley catheter in place and be taught CIC in urology clinic.  We will arrange for follow up.  Pieter Partridge A Sukhu 02/21/2016, 1:35 PM   I have reviewed the chart and recommendations and spoken with the patient.   I concur with the above assessment.

## 2016-02-21 NOTE — Progress Notes (Signed)
PROGRESS NOTE    [name]  HYQ:[id number] DOB: 1990/04/26 DOA: 02/20/2016 PCP: Vicenta Aly, FNP    Brief Narrative:  [name]  is a 26 y.o. female, with medical history of diabetes mellitus type 1, recent pancreatitis, liver adenomas, anxiety, hepatic steatosis, asthma , presents today with abdominal pain and coffee-ground emesis, reports she did have dental procedure yesterday, she reports nausea, vomiting, hematemesis and diarrhea over last 24 hours, had recent hospitalization for similar complaints as well, where she was found with DKA. She denies fever, chills, chest pain, dyspnea, neck pain, dysuria, polyuria or hematuria.  Assessment & Plan:   Active Problems:   DKA (diabetic ketoacidoses) (HCC)   Nausea vomiting and diarrhea   Hematemesis   Dehydration   Abdominal pain   1-DKA; DM type 1; Presents with Hyperglycemia, anion gap acidosis.  She has received IV fluids.  Gap has close, but bicarb is still at 16. She continue to have nausea and vomiting. Wont probably be able to tolerates oral intake. Will continue with IV insulin, IV fluids with D 5 and follow B-met.  IV bolus.   Abdominal pain, nausea vomiting/coffee ground emesis CT abdomen with no acute abnormalities. Mild right hydronephrosis. Marked distended urinary bladder.  No further coffee ground emesis.  Hb 10.9 stable.  IV protonix GI following.  Continue with IV Reglan.   Urinary retention. Right hydronephrosis;  Placed foley catheter.  Urology consulted.  Patient might need to be discharge with foley catheter.   UTI; follow urine culture.  Continue with ceftriaxone.   Asthma;  Continue with montelukast. Dulera.  Add prn albuterol.     DVT prophylaxis: SCD.  Code Status: Full Code.  Family Communication: Care discussed with patient  Disposition Plan: remain in the step down unit   Consultants:   Urology    Procedures:   none   Antimicrobials:  Ceftriaxone  02-20-2016   Subjective: Complaining of abdominal pain, nausea and vomiting persist.  Denies dysuria.   Objective: Vitals:   02/21/16 0322 02/21/16 0400 02/21/16 0500 02/21/16 0600  BP:  (!) 158/97  (!) 152/110  Pulse:  (!) 139 (!) 127 (!) 125  Resp:  (!) 30 (!) 21 17  Temp: 98.7 F (37.1 C)     TempSrc: Oral     SpO2:  100% 100% 100%  Weight:      Height:        Intake/Output Summary (Last 24 hours) at 02/21/16 0805 Last data filed at 02/21/16 0600  Gross per 24 hour  Intake          7361.67 ml  Output             1000 ml  Net          6361.67 ml   Filed Weights   02/20/16 0029  Weight: 52.6 kg (116 lb)    Examination:  General exam: alert, nauseous.  Respiratory system: Clear to auscultation. Respiratory effort normal. Cardiovascular system: S1 & S2 heard, RRR. No JVD, murmurs, rubs, gallops or clicks. No pedal edema. Gastrointestinal system: Abdomen is nondistended, soft and nontender. No organomegaly or masses felt. Normal bowel sounds heard. Central nervous system: Alert and oriented. No focal neurological deficits. Extremities: Symmetric 5 x 5 power. Skin: No rashes, lesions or ulcers     Data Reviewed: I have personally reviewed following labs and imaging studies  CBC:  Recent Labs Lab 02/20/16 0253 02/20/16 1400 02/21/16 0335  WBC 12.4*  --  10.0  NEUTROABS 11.2*  --   --  HGB 13.3 11.6* 10.9*  HCT 42.9 34.8* 33.8*  MCV 91.3  --  90.6  PLT 567*  --  015*   Basic Metabolic Panel:  Recent Labs Lab 02/20/16 0253 02/20/16 1400 02/20/16 1940 02/20/16 2330 02/21/16 0335  NA 136 139 138 137 138  K 4.9 4.0 4.0 3.7 3.6  CL 102 111 116* 114* 113*  CO2 10* 15* 16* 15* 16*  GLUCOSE 543* 126* 134* 134* 134*  BUN _0 CREATININE 0.95 0.72 0.56 0.60 0.73  CALCIUM 9.7 8.5* 7.4* 7.9* 8.2*  MG 2.1  --   --   --   --    GFR: Estimated Creatinine Clearance: 88.5 mL/min (by C-G formula based on SCr of 0.73 mg/dL). Liver Function  Tests:  Recent Labs Lab 02/20/16 0253  AST 25  ALT 18  ALKPHOS 128*  BILITOT 2.3*  PROT 9.1*  ALBUMIN 4.8    Recent Labs Lab 02/20/16 0253  LIPASE 10*   No results for input(s): AMMONIA in the last 168 hours. Coagulation Profile: No results for input(s): INR, PROTIME in the last 168 hours. Cardiac Enzymes: No results for input(s): CKTOTAL, CKMB, CKMBINDEX, TROPONINI in the last 168 hours. BNP (last 3 results) No results for input(s): PROBNP in the last 8760 hours. HbA1C:  Recent Labs  02/20/16 1400  HGBA1C 9.6*   CBG:  Recent Labs Lab 02/21/16 0258 02/21/16 0413 02/21/16 0530 02/21/16 0634 02/21/16 0742  GLUCAP 131* 206* 187* 155* 127*   Lipid Profile: No results for input(s): CHOL, HDL, LDLCALC, TRIG, CHOLHDL, LDLDIRECT in the last 72 hours. Thyroid Function Tests: No results for input(s): TSH, T4TOTAL, FREET4, T3FREE, THYROIDAB in the last 72 hours. Anemia Panel: No results for input(s): VITAMINB12, FOLATE, FERRITIN, TIBC, IRON, RETICCTPCT in the last 72 hours. Sepsis Labs: No results for input(s): PROCALCITON, LATICACIDVEN in the last 168 hours.  Recent Results (from the past 240 hour(s))  MRSA PCR Screening     Status: None   Collection Time: 02/20/16  2:40 PM  Result Value Ref Range Status   MRSA by PCR NEGATIVE NEGATIVE Final    Comment:        The GeneXpert MRSA Assay (FDA approved for NASAL specimens only), is one component of a comprehensive MRSA colonization surveillance program. It is not intended to diagnose MRSA infection nor to guide or monitor treatment for MRSA infections.          Radiology Studies: Ct Abdomen Pelvis Wo Contrast  Result Date: 02/20/2016 CLINICAL DATA:  Diabetic patient with abdominal pain coffee ground emesis today. Recent diagnosis of pancreatitis. EXAM: CT ABDOMEN AND PELVIS WITHOUT CONTRAST TECHNIQUE: Multidetector CT imaging of the abdomen and pelvis was performed following the standard protocol without  IV contrast. COMPARISON:  CT abdomen and pelvis 11/02/2015 and 04/21/2015. MRI abdomen 11/27/2014. FINDINGS: Lower chest: Lung bases are clear. No pleural or pericardial effusion. Hepatobiliary: Status post cholecystectomy. Three low attenuating lesions in the liver measuring 1.8 cm on, 1.5 cm on image 16 image 23 and 2.5 cm on image 35 are unchanged and compatible with adenomas based on prior imaging. Pancreas: Unremarkable. No pancreatic ductal dilatation or surrounding inflammatory changes. Spleen: Normal in size without focal abnormality. Adrenals/Urinary Tract: The adrenal glands are unremarkable. Mild right hydronephrosis is presumably due to marked bladder distention which is present on the prior exams. The left kidney appears normal. No urinary tract stone is identified. Stomach/Bowel: Stomach is within normal limits. Appendix appears normal. No evidence of bowel  wall thickening, distention, or inflammatory changes. Vascular/Lymphatic: No significant vascular findings are present. No enlarged abdominal or pelvic lymph nodes. Reproductive: Uterus and bilateral adnexa are unremarkable. Other: No abdominal wall hernia or abnormality. No abdominopelvic ascites. Musculoskeletal: Negative. IMPRESSION: No acute abnormality. Mild right hydronephrosis is presumably due to marked distention of the urinary bladder, a chronic abnormality. Cause for distention is not identified. No change in 3 liver lesions presumably representing adenomas based on prior exams. Electronically Signed   By: Inge Rise M.D.   On: 02/20/2016 12:29   Dg Chest Port 1 View  Result Date: 02/20/2016 CLINICAL DATA:  Nausea and vomiting. EXAM: PORTABLE CHEST 1 VIEW COMPARISON:  01/17/2016 FINDINGS: The cardiomediastinal contours are normal. Bronchial thickening. Pulmonary vasculature is normal. No consolidation, pleural effusion, or pneumothorax. No acute osseous abnormalities are seen. IMPRESSION: Bronchial thickening.  No focal airspace  consolidation Electronically Signed   By: Jeb Levering M.D.   On: 02/20/2016 04:40        Scheduled Meds: . cefTRIAXone (ROCEPHIN)  IV  1 g Intravenous Q24H  . escitalopram  5 mg Oral Daily  . famotidine  20 mg Oral QHS  . insulin regular  0-10 Units Intravenous TID WC  . metoCLOPramide (REGLAN) injection  5 mg Intravenous Q6H  . mometasone-formoterol  2 puff Inhalation BID  . montelukast  10 mg Oral Daily  . [START ON 02/23/2016] pantoprazole  40 mg Intravenous Q12H  . potassium chloride  20 mEq Oral Daily   Continuous Infusions: . sodium chloride Stopped (02/20/16 1809)  . sodium chloride Stopped (02/20/16 1450)  . dextrose 5 % and 0.45% NaCl 100 mL/hr at 02/21/16 0600  . insulin (NOVOLIN-R) infusion 4 Units/hr (02/20/16 1710)  . insulin (NOVOLIN-R) infusion 3.8 Units/hr (02/21/16 0636)  . pantoprozole (PROTONIX) infusion 8 mg/hr (02/21/16 0600)     LOS: 1 day    Time spent: 35 minutes.     Elmarie Shiley, MD [facility] Pager [phone]  If 7PM-7AM, please contact night-coverage [email or web address] Password [email or web address] 02/21/2016, 8:05 AM

## 2016-02-22 LAB — GLUCOSE, CAPILLARY
GLUCOSE-CAPILLARY: 112 mg/dL — AB (ref 65–99)
GLUCOSE-CAPILLARY: 158 mg/dL — AB (ref 65–99)
GLUCOSE-CAPILLARY: 200 mg/dL — AB (ref 65–99)
GLUCOSE-CAPILLARY: 203 mg/dL — AB (ref 65–99)
GLUCOSE-CAPILLARY: 214 mg/dL — AB (ref 65–99)
GLUCOSE-CAPILLARY: 256 mg/dL — AB (ref 65–99)
Glucose-Capillary: 157 mg/dL — ABNORMAL HIGH (ref 65–99)
Glucose-Capillary: 136 mg/dL — ABNORMAL HIGH (ref 65–99)
Glucose-Capillary: 130 mg/dL — ABNORMAL HIGH (ref 65–99)
Glucose-Capillary: 214 mg/dL — ABNORMAL HIGH (ref 65–99)
Glucose-Capillary: 147 mg/dL — ABNORMAL HIGH (ref 65–99)
Glucose-Capillary: 202 mg/dL — ABNORMAL HIGH (ref 65–99)
Glucose-Capillary: 221 mg/dL — ABNORMAL HIGH (ref 65–99)
Glucose-Capillary: 178 mg/dL — ABNORMAL HIGH (ref 65–99)

## 2016-02-22 LAB — CBC
HEMATOCRIT: 37.9 % (ref 36.0–46.0)
HEMOGLOBIN: 12.7 g/dL (ref 12.0–15.0)
MCH: 29 pg (ref 26.0–34.0)
MCHC: 33.5 g/dL (ref 30.0–36.0)
MCV: 86.5 fL (ref 78.0–100.0)
Platelets: 447 10*3/uL — ABNORMAL HIGH (ref 150–400)
RBC: 4.38 MIL/uL (ref 3.87–5.11)
RDW: 14.1 % (ref 11.5–15.5)
WBC: 6.6 10*3/uL (ref 4.0–10.5)

## 2016-02-22 LAB — URINE CULTURE: Culture: >=100000 — AB

## 2016-02-22 LAB — BASIC METABOLIC PANEL
ANION GAP: 10 (ref 5–15)
ANION GAP: 9 (ref 5–15)
Anion gap: 11 (ref 5–15)
BUN: <5 mg/dL — ABNORMAL LOW (ref 6–20)
BUN: <5 mg/dL — ABNORMAL LOW (ref 6–20)
CALCIUM: 8.5 mg/dL — AB (ref 8.9–10.3)
CALCIUM: 8.4 mg/dL — AB (ref 8.9–10.3)
CALCIUM: 8 mg/dL — AB (ref 8.9–10.3)
CO2: 20 mmol/L — ABNORMAL LOW (ref 22–32)
CO2: 20 mmol/L — AB (ref 22–32)
CO2: 21 mmol/L — ABNORMAL LOW (ref 22–32)
Chloride: 107 mmol/L (ref 101–111)
Chloride: 108 mmol/L (ref 101–111)
Chloride: 107 mmol/L (ref 101–111)
Creatinine, Ser: 0.49 mg/dL (ref 0.44–1.00)
Creatinine, Ser: 0.55 mg/dL (ref 0.44–1.00)
Creatinine, Ser: 0.55 mg/dL (ref 0.44–1.00)
GFR calc Af Amer: >60 mL/min (ref >60)
GLUCOSE: 185 mg/dL — AB (ref 65–99)
Glucose, Bld: 193 mg/dL — ABNORMAL HIGH (ref 65–99)
Glucose, Bld: 166 mg/dL — ABNORMAL HIGH (ref 65–99)
Potassium: 3.7 mmol/L (ref 3.5–5.1)
Potassium: 3.7 mmol/L (ref 3.5–5.1)
Potassium: 3.4 mmol/L — ABNORMAL LOW (ref 3.5–5.1)
SODIUM: 139 mmol/L (ref 135–145)
Sodium: 137 mmol/L (ref 135–145)
Sodium: 137 mmol/L (ref 135–145)

## 2016-02-22 MED ORDER — INSULIN GLARGINE 100 UNIT/ML ~~LOC~~ SOLN
15.0000 [IU] | Freq: Every day | SUBCUTANEOUS | Status: DC
  Administered 2016-02-22: 15 [IU] via SUBCUTANEOUS
  Filled 2016-02-22 (×2): qty 0.15

## 2016-02-22 MED ORDER — INSULIN ASPART 100 UNIT/ML ~~LOC~~ SOLN
0.0000 [IU] | Freq: Every day | SUBCUTANEOUS | Status: DC
  Administered 2016-02-22 – 2016-02-23 (×2): 2 [IU] via SUBCUTANEOUS

## 2016-02-22 MED ORDER — POTASSIUM CHLORIDE CRYS ER 20 MEQ PO TBCR
40.0000 meq | EXTENDED_RELEASE_TABLET | Freq: Once | ORAL | Status: AC
  Administered 2016-02-22: 40 meq via ORAL
  Filled 2016-02-22: qty 2

## 2016-02-22 MED ORDER — SODIUM CHLORIDE 0.9 % IV SOLN
INTRAVENOUS | Status: DC
  Administered 2016-02-22 – 2016-02-24 (×4): via INTRAVENOUS

## 2016-02-22 MED ORDER — INSULIN ASPART 100 UNIT/ML ~~LOC~~ SOLN
0.0000 [IU] | Freq: Three times a day (TID) | SUBCUTANEOUS | Status: DC
  Administered 2016-02-22 (×2): 3 [IU] via SUBCUTANEOUS

## 2016-02-22 MED ORDER — INSULIN ASPART 100 UNIT/ML ~~LOC~~ SOLN
0.0000 [IU] | Freq: Three times a day (TID) | SUBCUTANEOUS | Status: DC
  Administered 2016-02-23: 5 [IU] via SUBCUTANEOUS
  Administered 2016-02-23: 2 [IU] via SUBCUTANEOUS
  Administered 2016-02-23: 9 [IU] via SUBCUTANEOUS
  Administered 2016-02-24: 3 [IU] via SUBCUTANEOUS
  Administered 2016-02-24: 7 [IU] via SUBCUTANEOUS
  Administered 2016-02-24: 1 [IU] via SUBCUTANEOUS
  Administered 2016-02-25 (×2): 2 [IU] via SUBCUTANEOUS

## 2016-02-22 MED ORDER — FLUCONAZOLE IN SODIUM CHLORIDE 100-0.9 MG/50ML-% IV SOLN
100.0000 mg | INTRAVENOUS | Status: DC
  Administered 2016-02-22 – 2016-02-25 (×4): 100 mg via INTRAVENOUS
  Filled 2016-02-22 (×7): qty 50

## 2016-02-22 NOTE — Progress Notes (Signed)
PROGRESS NOTE    [name]  [id number] DOB: [birth year] DOA: 02/20/2016 PCP: Vicenta Aly, FNP    Brief Narrative:  [name]  is a 26 y.o. female, with medical history of diabetes mellitus type 1, recent pancreatitis, liver adenomas, anxiety, hepatic steatosis, asthma , presents today with abdominal pain and coffee-ground emesis, reports she did have dental procedure yesterday, she reports nausea, vomiting, hematemesis and diarrhea over last 24 hours, had recent hospitalization for similar complaints as well, where she was found with DKA. She denies fever, chills, chest pain, dyspnea, neck pain, dysuria, polyuria or hematuria.  Assessment & Plan:   Active Problems:   DKA (diabetic ketoacidoses) (HCC)   Nausea vomiting and diarrhea   Hematemesis   Dehydration   Abdominal pain   1-DKA; DM type 1; Presents with Hyperglycemia, anion gap acidosis.  She has received IV fluids.  Gap has close, but bicarb has increased to 20.   Feels she will be able to tolerates clears.  Will transition from insulin Gtt to lantus.  SSI. Change IV fluids to NS after stopping insulin gtt.   Abdominal pain, nausea vomiting/coffee ground emesis CT abdomen with no acute abnormalities. Mild right hydronephrosis. Marked distended urinary bladder.  IV protonix GI following. No endoscopy unless no improvement.  Continue with IV Reglan.  Await labs for the morning.   Urinary retention. Right hydronephrosis;  Placed foley catheter.  Urology consulted. She will need intermittent catheterization at discharge and follow up with urology.    UTI; follow urine culture. Growing 100,000 colonies yeast  Continue with ceftriaxone day2/3.  Started fluconazole.   Asthma;  Continue with montelukast. Dulera.  Add prn albuterol.     DVT prophylaxis: SCD.  Code Status: Full Code.  Family Communication: Care discussed with patient  Disposition Plan: remain in the step down  unit   Consultants:   Urology    Procedures:   none   Antimicrobials:  Ceftriaxone 02-20-2016   Subjective: Feels some improvement this morning.  Nausea has improved. No abdominal pain at this time.   Objective: Vitals:   02/22/16 0400 02/22/16 0452 02/22/16 0500 02/22/16 0600  BP: (!) 99/50  125/89 (!) 138/92  Pulse: (!) 110  (!) 115 (!) 117  Resp: 14  (!) 24 15  Temp:  98.8 F (37.1 C)    TempSrc:  Oral    SpO2: 98%  100% 99%  Weight:      Height:        Intake/Output Summary (Last 24 hours) at 02/22/16 0709 Last data filed at 02/22/16 0600  Gross per 24 hour  Intake          5262.73 ml  Output             5950 ml  Net          -687.27 ml   Filed Weights   02/20/16 0029  Weight: 52.6 kg (116 lb)    Examination:  General exam: alert, no distress.  Respiratory system: Clear to auscultation. Respiratory effort normal. Cardiovascular system: S1 & S2 heard, RRR. No JVD, murmurs, rubs, gallops or clicks. No pedal edema. Gastrointestinal system: Abdomen is nondistended, soft and nontender. No organomegaly or masses felt. Normal bowel sounds heard. Central nervous system: Alert and oriented. No focal neurological deficits. Extremities: Symmetric 5 x 5 power. Skin: No rashes, lesions or ulcers     Data Reviewed: I have personally reviewed following labs and imaging studies  CBC:  Recent Labs Lab 02/20/16 0253 02/20/16 1400  02/21/16 0335  WBC 12.4*  --  10.0  NEUTROABS 11.2*  --   --   HGB 13.3 11.6* 10.9*  HCT 42.9 34.8* 33.8*  MCV 91.3  --  90.6  PLT 567*  --  [id number]*   Basic Metabolic Panel:  Recent Labs Lab 02/20/16 0253  02/21/16 1216 02/21/16 1600 02/21/16 2015 02/22/16 0035 02/22/16 0450  NA 136  < > 135 136 136 137 137  K 4.9  < > 3.8 3.3* 2.9* 3.7 3.7  CL 102  < > 109 110 108 107 108  CO2 10*  < > 15* 18* 20* 20* 20*  GLUCOSE 543*  < > 180* 180* 160* 193* 166*  BUN 18  < > <5* <5* <5* <5* <5*  CREATININE 0.95  < > 0.48 0.45  0.51 0.49 0.55  CALCIUM 9.7  < > 7.9* 8.3* 8.3* 8.5* 8.0*  MG 2.1  --   --   --   --   --   --   < > = values in this interval not displayed. GFR: Estimated Creatinine Clearance: 88.5 mL/min (by C-G formula based on SCr of 0.55 mg/dL). Liver Function Tests:  Recent Labs Lab 02/20/16 0253  AST 25  ALT 18  ALKPHOS 128*  BILITOT 2.3*  PROT 9.1*  ALBUMIN 4.8    Recent Labs Lab 02/20/16 0253  LIPASE 10*   No results for input(s): AMMONIA in the last 168 hours. Coagulation Profile: No results for input(s): INR, PROTIME in the last 168 hours. Cardiac Enzymes: No results for input(s): CKTOTAL, CKMB, CKMBINDEX, TROPONINI in the last 168 hours. BNP (last 3 results) No results for input(s): PROBNP in the last 8760 hours. HbA1C:  Recent Labs  02/20/16 1400  HGBA1C 9.6*   CBG:  Recent Labs Lab 02/22/16 0256 02/22/16 0355 02/22/16 0453 02/22/16 0602 02/22/16 0702  GLUCAP 136* 200* 158* 130* 112*   Lipid Profile: No results for input(s): CHOL, HDL, LDLCALC, TRIG, CHOLHDL, LDLDIRECT in the last 72 hours. Thyroid Function Tests: No results for input(s): TSH, T4TOTAL, FREET4, T3FREE, THYROIDAB in the last 72 hours. Anemia Panel: No results for input(s): VITAMINB12, FOLATE, FERRITIN, TIBC, IRON, RETICCTPCT in the last 72 hours. Sepsis Labs: No results for input(s): PROCALCITON, LATICACIDVEN in the last 168 hours.  Recent Results (from the past 240 hour(s))  MRSA PCR Screening     Status: None   Collection Time: 02/20/16  2:40 PM  Result Value Ref Range Status   MRSA by PCR NEGATIVE NEGATIVE Final    Comment:        The GeneXpert MRSA Assay (FDA approved for NASAL specimens only), is one component of a comprehensive MRSA colonization surveillance program. It is not intended to diagnose MRSA infection nor to guide or monitor treatment for MRSA infections.          Radiology Studies: Ct Abdomen Pelvis Wo Contrast  Result Date: 02/20/2016 CLINICAL DATA:   Diabetic patient with abdominal pain coffee ground emesis today. Recent diagnosis of pancreatitis. EXAM: CT ABDOMEN AND PELVIS WITHOUT CONTRAST TECHNIQUE: Multidetector CT imaging of the abdomen and pelvis was performed following the standard protocol without IV contrast. COMPARISON:  CT abdomen and pelvis 11/02/2015 and 04/21/2015. MRI abdomen 11/27/2014. FINDINGS: Lower chest: Lung bases are clear. No pleural or pericardial effusion. Hepatobiliary: Status post cholecystectomy. Three low attenuating lesions in the liver measuring 1.8 cm on, 1.5 cm on image 16 image 23 and 2.5 cm on image 35 are unchanged and compatible with adenomas based  on prior imaging. Pancreas: Unremarkable. No pancreatic ductal dilatation or surrounding inflammatory changes. Spleen: Normal in size without focal abnormality. Adrenals/Urinary Tract: The adrenal glands are unremarkable. Mild right hydronephrosis is presumably due to marked bladder distention which is present on the prior exams. The left kidney appears normal. No urinary tract stone is identified. Stomach/Bowel: Stomach is within normal limits. Appendix appears normal. No evidence of bowel wall thickening, distention, or inflammatory changes. Vascular/Lymphatic: No significant vascular findings are present. No enlarged abdominal or pelvic lymph nodes. Reproductive: Uterus and bilateral adnexa are unremarkable. Other: No abdominal wall hernia or abnormality. No abdominopelvic ascites. Musculoskeletal: Negative. IMPRESSION: No acute abnormality. Mild right hydronephrosis is presumably due to marked distention of the urinary bladder, a chronic abnormality. Cause for distention is not identified. No change in 3 liver lesions presumably representing adenomas based on prior exams. Electronically Signed   By: Inge Rise M.D.   On: 02/20/2016 12:29        Scheduled Meds: . cefTRIAXone (ROCEPHIN)  IV  1 g Intravenous Q24H  . escitalopram  5 mg Oral Daily  . famotidine  20  mg Oral QHS  . insulin regular  0-10 Units Intravenous TID WC  . metoCLOPramide (REGLAN) injection  5 mg Intravenous Q6H  . mometasone-formoterol  2 puff Inhalation BID  . montelukast  10 mg Oral Daily  . [START ON 02/23/2016] pantoprazole  40 mg Intravenous Q12H  . potassium chloride  20 mEq Oral Daily  . sodium bicarbonate  1,300 mg Oral BID  . sodium chloride  500 mL Intravenous Once   Continuous Infusions: . dextrose 5 % and 0.45% NaCl 125 mL/hr at 02/22/16 0600  . insulin (NOVOLIN-R) infusion 1.4 Units/hr (02/22/16 0603)  . pantoprozole (PROTONIX) infusion 8 mg/hr (02/22/16 0600)     LOS: 2 days    Time spent: 35 minutes.     Elmarie Shiley, MD [facility] Pager [phone]  If 7PM-7AM, please contact night-coverage [email or web address] Password [email or web address] 02/22/2016, 7:09 AM

## 2016-02-22 NOTE — Progress Notes (Signed)
Subjective: The patient did not want to engage in much conversation because she says she feels terrible this morning. Creatinine stable. Urine culture growing yeast. Foley draining well.  Objective: Vital signs in last 24 hours: Temp:  [98.2 F (36.8 C)-99.7 F (37.6 C)] 99.7 F (37.6 C) (02/03 0800) Pulse Rate:  [105-125] 117 (02/03 0600) Resp:  [12-27] 15 (02/03 0600) BP: (99-172)/(50-117) 138/92 (02/03 0600) SpO2:  [97 %-100 %] 99 % (02/03 0600)  Intake/Output from previous day: 02/02 0701 - 02/03 0700 In: 5262.7 [I.V.:3467.7; IV Piggyback:1795] Out: [id number] [Urine:5920; Emesis/NG output:30] Intake/Output this shift: No intake/output data recorded.  Physical Exam:  General: Alert and oriented. CV: tachycardic on monitor Lungs: normal work of breathing Urine: Clear yellow in foley Extremities: Nontender, no erythema, no edema.  Lab Results:  Recent Labs  02/20/16 1400 02/21/16 0335 02/22/16 0813  HGB 11.6* 10.9* 12.7  HCT 34.8* 33.8* 37.9          Recent Labs  02/21/16 2015 02/22/16 0035 02/22/16 0450  CREATININE 0.51 0.49 0.55           Results for orders placed or performed during the hospital encounter of 02/20/16 (from the past 24 hour(s))  Glucose, capillary     Status: Abnormal   Collection Time: 02/21/16 10:24 AM  Result Value Ref Range   Glucose-Capillary 115 (H) 65 - 99 mg/dL  Glucose, capillary     Status: Abnormal   Collection Time: 02/21/16 11:46 AM  Result Value Ref Range   Glucose-Capillary 133 (H) 65 - 99 mg/dL  Basic metabolic panel     Status: Abnormal   Collection Time: 02/21/16 12:16 PM  Result Value Ref Range   Sodium 135 135 - 145 mmol/L   Potassium 3.8 3.5 - 5.1 mmol/L   Chloride 109 101 - 111 mmol/L   CO2 15 (L) 22 - 32 mmol/L   Glucose, Bld 180 (H) 65 - 99 mg/dL   BUN <5 (L) 6 - 20 mg/dL   Creatinine, Ser 0.48 0.44 - 1.00 mg/dL   Calcium 7.9 (L) 8.9 - 10.3 mg/dL   GFR calc non Af Amer >60 >60 mL/min   GFR calc Af Amer >60  >60 mL/min   Anion gap 11 5 - 15  Glucose, capillary     Status: Abnormal   Collection Time: 02/21/16  1:04 PM  Result Value Ref Range   Glucose-Capillary 187 (H) 65 - 99 mg/dL   Comment 1 Notify RN    Comment 2 Document in Chart   Glucose, capillary     Status: Abnormal   Collection Time: 02/21/16  2:12 PM  Result Value Ref Range   Glucose-Capillary 140 (H) 65 - 99 mg/dL  Glucose, capillary     Status: Abnormal   Collection Time: 02/21/16  3:17 PM  Result Value Ref Range   Glucose-Capillary 158 (H) 65 - 99 mg/dL  Basic metabolic panel     Status: Abnormal   Collection Time: 02/21/16  4:00 PM  Result Value Ref Range   Sodium 136 135 - 145 mmol/L   Potassium 3.3 (L) 3.5 - 5.1 mmol/L   Chloride 110 101 - 111 mmol/L   CO2 18 (L) 22 - 32 mmol/L   Glucose, Bld 180 (H) 65 - 99 mg/dL   BUN <5 (L) 6 - 20 mg/dL   Creatinine, Ser 0.45 0.44 - 1.00 mg/dL   Calcium 8.3 (L) 8.9 - 10.3 mg/dL   GFR calc non Af Amer >60 >60 mL/min  GFR calc Af Amer >60 >60 mL/min   Anion gap 8 5 - 15  Glucose, capillary     Status: Abnormal   Collection Time: 02/21/16  4:23 PM  Result Value Ref Range   Glucose-Capillary 180 (H) 65 - 99 mg/dL  Glucose, capillary     Status: Abnormal   Collection Time: 02/21/16  5:26 PM  Result Value Ref Range   Glucose-Capillary 185 (H) 65 - 99 mg/dL  Glucose, capillary     Status: Abnormal   Collection Time: 02/21/16  6:25 PM  Result Value Ref Range   Glucose-Capillary 137 (H) 65 - 99 mg/dL  Glucose, capillary     Status: Abnormal   Collection Time: 02/21/16  7:33 PM  Result Value Ref Range   Glucose-Capillary 190 (H) 65 - 99 mg/dL  Basic metabolic panel     Status: Abnormal   Collection Time: 02/21/16  8:15 PM  Result Value Ref Range   Sodium 136 135 - 145 mmol/L   Potassium 2.9 (L) 3.5 - 5.1 mmol/L   Chloride 108 101 - 111 mmol/L   CO2 20 (L) 22 - 32 mmol/L   Glucose, Bld 160 (H) 65 - 99 mg/dL   BUN <5 (L) 6 - 20 mg/dL   Creatinine, Ser 0.51 0.44 - 1.00 mg/dL    Calcium 8.3 (L) 8.9 - 10.3 mg/dL   GFR calc non Af Amer >60 >60 mL/min   GFR calc Af Amer >60 >60 mL/min   Anion gap 8 5 - 15  Glucose, capillary     Status: Abnormal   Collection Time: 02/21/16  8:39 PM  Result Value Ref Range   Glucose-Capillary 124 (H) 65 - 99 mg/dL  Glucose, capillary     Status: Abnormal   Collection Time: 02/21/16  9:38 PM  Result Value Ref Range   Glucose-Capillary 178 (H) 65 - 99 mg/dL  Glucose, capillary     Status: Abnormal   Collection Time: 02/21/16 10:41 PM  Result Value Ref Range   Glucose-Capillary 156 (H) 65 - 99 mg/dL  Glucose, capillary     Status: Abnormal   Collection Time: 02/21/16 11:38 PM  Result Value Ref Range   Glucose-Capillary 153 (H) 65 - 99 mg/dL   Comment 1 Notify RN   Basic metabolic panel     Status: Abnormal   Collection Time: 02/22/16 12:35 AM  Result Value Ref Range   Sodium 137 135 - 145 mmol/L   Potassium 3.7 3.5 - 5.1 mmol/L   Chloride 107 101 - 111 mmol/L   CO2 20 (L) 22 - 32 mmol/L   Glucose, Bld 193 (H) 65 - 99 mg/dL   BUN <5 (L) 6 - 20 mg/dL   Creatinine, Ser 0.49 0.44 - 1.00 mg/dL   Calcium 8.5 (L) 8.9 - 10.3 mg/dL   GFR calc non Af Amer >60 >60 mL/min   GFR calc Af Amer >60 >60 mL/min   Anion gap 10 5 - 15  Glucose, capillary     Status: Abnormal   Collection Time: 02/22/16 12:45 AM  Result Value Ref Range   Glucose-Capillary 203 (H) 65 - 99 mg/dL  Glucose, capillary     Status: Abnormal   Collection Time: 02/22/16  1:50 AM  Result Value Ref Range   Glucose-Capillary 157 (H) 65 - 99 mg/dL  Glucose, capillary     Status: Abnormal   Collection Time: 02/22/16  2:56 AM  Result Value Ref Range   Glucose-Capillary 136 (H) 65 - 99  mg/dL  Glucose, capillary     Status: Abnormal   Collection Time: 02/22/16  3:55 AM  Result Value Ref Range   Glucose-Capillary 200 (H) 65 - 99 mg/dL  Basic metabolic panel     Status: Abnormal   Collection Time: 02/22/16  4:50 AM  Result Value Ref Range   Sodium 137 135 - 145  mmol/L   Potassium 3.7 3.5 - 5.1 mmol/L   Chloride 108 101 - 111 mmol/L   CO2 20 (L) 22 - 32 mmol/L   Glucose, Bld 166 (H) 65 - 99 mg/dL   BUN <5 (L) 6 - 20 mg/dL   Creatinine, Ser 0.55 0.44 - 1.00 mg/dL   Calcium 8.0 (L) 8.9 - 10.3 mg/dL   GFR calc non Af Amer >60 >60 mL/min   GFR calc Af Amer >60 >60 mL/min   Anion gap 9 5 - 15  Glucose, capillary     Status: Abnormal   Collection Time: 02/22/16  4:53 AM  Result Value Ref Range   Glucose-Capillary 158 (H) 65 - 99 mg/dL  Glucose, capillary     Status: Abnormal   Collection Time: 02/22/16  6:02 AM  Result Value Ref Range   Glucose-Capillary 130 (H) 65 - 99 mg/dL  Glucose, capillary     Status: Abnormal   Collection Time: 02/22/16  7:02 AM  Result Value Ref Range   Glucose-Capillary 112 (H) 65 - 99 mg/dL  CBC     Status: Abnormal   Collection Time: 02/22/16  8:13 AM  Result Value Ref Range   WBC 6.6 4.0 - 10.5 K/uL   RBC 4.38 3.87 - 5.11 MIL/uL   Hemoglobin 12.7 12.0 - 15.0 g/dL   HCT 37.9 36.0 - 46.0 %   MCV 86.5 78.0 - 100.0 fL   MCH 29.0 26.0 - 34.0 pg   MCHC 33.5 30.0 - 36.0 g/dL   RDW 14.1 11.5 - 15.5 %   Platelets 447 (H) 150 - 400 K/uL  Glucose, capillary     Status: Abnormal   Collection Time: 02/22/16  8:29 AM  Result Value Ref Range   Glucose-Capillary 256 (H) 65 - 99 mg/dL    Assessment/Plan: 27 year old female with history of diabetes mellitus type 1 and 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 She will need to learn clean intermittent catheterization as this will allow her to continue voiding spontaneously and minimize the risk of UTI.   1) Recommend patient learns how to self catheterize every 6 hours during the day, or more frequently to keep volumes <519ml. This can be deferred until she is not diuresing as much.  If this is not feasible in the inpatient setting, she can be discharged with foley catheter in place and be taught CIC in urology clinic. 2) Recommend  treatment of fungal UTI. Likely can discontinue Ceftriaxone.   LOS: 2 days   Lolita Rieger 02/22/2016, 9:04 AM

## 2016-02-23 LAB — CBC
HEMATOCRIT: 31.8 % — AB (ref 36.0–46.0)
HEMOGLOBIN: 11.2 g/dL — AB (ref 12.0–15.0)
MCH: 30.2 pg (ref 26.0–34.0)
MCHC: 35.2 g/dL (ref 30.0–36.0)
MCV: 85.7 fL (ref 78.0–100.0)
Platelets: 353 10*3/uL (ref 150–400)
RBC: 3.71 MIL/uL — AB (ref 3.87–5.11)
RDW: 14 % (ref 11.5–15.5)
WBC: 8.4 10*3/uL (ref 4.0–10.5)

## 2016-02-23 LAB — GLUCOSE, CAPILLARY
GLUCOSE-CAPILLARY: 260 mg/dL — AB (ref 65–99)
GLUCOSE-CAPILLARY: 186 mg/dL — AB (ref 65–99)
Glucose-Capillary: 390 mg/dL — ABNORMAL HIGH (ref 65–99)
Glucose-Capillary: 241 mg/dL — ABNORMAL HIGH (ref 65–99)

## 2016-02-23 LAB — BASIC METABOLIC PANEL
ANION GAP: 12 (ref 5–15)
BUN: <5 mg/dL — ABNORMAL LOW (ref 6–20)
CHLORIDE: 105 mmol/L (ref 101–111)
CO2: 20 mmol/L — AB (ref 22–32)
Calcium: 8.1 mg/dL — ABNORMAL LOW (ref 8.9–10.3)
Creatinine, Ser: 0.5 mg/dL (ref 0.44–1.00)
GFR calc non Af Amer: >60 mL/min (ref >60)
Glucose, Bld: 291 mg/dL — ABNORMAL HIGH (ref 65–99)
POTASSIUM: 4.2 mmol/L (ref 3.5–5.1)
Sodium: 137 mmol/L (ref 135–145)

## 2016-02-23 LAB — HEPATIC FUNCTION PANEL
ALBUMIN: 2.9 g/dL — AB (ref 3.5–5.0)
ALT: 10 U/L — ABNORMAL LOW (ref 14–54)
AST: 17 U/L (ref 15–41)
Alkaline Phosphatase: 90 U/L (ref 38–126)
BILIRUBIN TOTAL: 0.7 mg/dL (ref 0.3–1.2)
Bilirubin, Direct: 0.1 mg/dL (ref 0.1–0.5)
Indirect Bilirubin: 0.6 mg/dL (ref 0.3–0.9)
Total Protein: 5.8 g/dL — ABNORMAL LOW (ref 6.5–8.1)

## 2016-02-23 MED ORDER — METOPROLOL TARTRATE 5 MG/5ML IV SOLN
5.0000 mg | Freq: Four times a day (QID) | INTRAVENOUS | Status: DC | PRN
  Filled 2016-02-23: qty 5

## 2016-02-23 MED ORDER — INSULIN GLARGINE 100 UNIT/ML ~~LOC~~ SOLN
20.0000 [IU] | Freq: Every day | SUBCUTANEOUS | Status: DC
  Administered 2016-02-23 – 2016-02-24 (×2): 20 [IU] via SUBCUTANEOUS
  Filled 2016-02-23 (×2): qty 0.2

## 2016-02-23 MED ORDER — METOCLOPRAMIDE HCL 5 MG/ML IJ SOLN
10.0000 mg | Freq: Three times a day (TID) | INTRAMUSCULAR | Status: DC
  Administered 2016-02-23 – 2016-02-25 (×5): 10 mg via INTRAVENOUS
  Filled 2016-02-23 (×5): qty 2

## 2016-02-23 MED ORDER — SODIUM CHLORIDE 0.9 % IV BOLUS (SEPSIS)
500.0000 mL | Freq: Once | INTRAVENOUS | Status: AC
  Administered 2016-02-23: 500 mL via INTRAVENOUS

## 2016-02-23 MED ORDER — PROMETHAZINE HCL 25 MG/ML IJ SOLN
6.2500 mg | Freq: Four times a day (QID) | INTRAMUSCULAR | Status: DC | PRN
  Administered 2016-02-23 (×2): 6.25 mg via INTRAVENOUS
  Filled 2016-02-23 (×2): qty 1

## 2016-02-23 NOTE — Progress Notes (Addendum)
PROGRESS NOTE    [name]  [id number] DOB: 1990/05/26 DOA: 02/20/2016 PCP: Vicenta Aly, FNP    Brief Narrative:  [name]  is a 26 y.o. female, with medical history of diabetes mellitus type 1, recent pancreatitis, liver adenomas, anxiety, hepatic steatosis, asthma , presents today with abdominal pain and coffee-ground emesis, reports she did have dental procedure yesterday, she reports nausea, vomiting, hematemesis and diarrhea over last 24 hours, had recent hospitalization for similar complaints as well, where she was found with DKA. She denies fever, chills, chest pain, dyspnea, neck pain, dysuria, polyuria or hematuria.  Assessment & Plan:   Active Problems:   DKA (diabetic ketoacidoses) (HCC)   Nausea vomiting and diarrhea   Hematemesis   Dehydration   Abdominal pain   1-DKA; DM type 1; Presents with Hyperglycemia, anion gap acidosis.  She has received IV fluids. She was initially treated with insulin Gtt.  Gap has close, but bicarb has increased to 20.   Increase lantus to 20 units. Continue with SSI.   Abdominal pain, nausea vomiting/coffee ground emesis CT abdomen with no acute abnormalities. Mild right hydronephrosis. Marked distended urinary bladder.  IV protonix GI following. No endoscopy unless no improvement.  Continue with IV Reglan.  Vomited multiples times yesterday. Feels better this morning.  Increase Reglan to 10 mg IV TID>   Urinary retention. Right hydronephrosis;  Placed foley catheter.  Urology consulted. She will need intermittent catheterization at discharge and follow up with urology.    UTI; follow urine culture. Growing 100,000 colonies yeast  Continue with ceftriaxone day3/3. Discontinue ceftriaxone.  Started fluconazole.   Asthma;  Continue with montelukast. Dulera.  Add prn albuterol.     DVT prophylaxis: SCD.  Code Status: Full Code.  Family Communication: Care discussed with patient  Disposition Plan:     Consultants:   Urology    Procedures:   none   Antimicrobials:  Ceftriaxone 02-20-2016   Subjective: Vomit all day yesterday. Was feeling well this morning.  Addendum;  Called by nurse , patient started to vomit again.   Objective: Vitals:   02/23/16 0400 02/23/16 0500 02/23/16 0600 02/23/16 0800  BP: (!) 165/111  (!) 171/112   Pulse: (!) 113 (!) 122 (!) 123   Resp: 14 17 (!) 21   Temp:  98.8 F (37.1 C)  98.9 F (37.2 C)  TempSrc:  Oral  Oral  SpO2: 100% 98% 99%   Weight:      Height:        Intake/Output Summary (Last 24 hours) at 02/23/16 0932 Last data filed at 02/23/16 0600  Gross per 24 hour  Intake          3101.25 ml  Output             7520 ml  Net         -4418.75 ml   Filed Weights   02/20/16 0029  Weight: 52.6 kg (116 lb)    Examination:  General exam: alert, no distress.  Respiratory system: Clear to auscultation. Respiratory effort normal. Cardiovascular system: S1 & S2 heard, RRR. No JVD, murmurs, rubs, gallops or clicks. No pedal edema. Gastrointestinal system: Abdomen is nondistended, soft and nontender. No organomegaly or masses felt. Normal bowel sounds heard. Central nervous system: Alert and oriented. No focal neurological deficits. Extremities: Symmetric 5 x 5 power. Skin: No rashes, lesions or ulcers     Data Reviewed: I have personally reviewed following labs and imaging studies  CBC:  Recent Labs Lab  02/20/16 0253 02/20/16 1400 02/21/16 0335 02/22/16 0813 02/23/16 0339  WBC 12.4*  --  10.0 6.6 8.4  NEUTROABS 11.2*  --   --   --   --   HGB 13.3 11.6* 10.9* 12.7 11.2*  HCT 42.9 34.8* 33.8* 37.9 31.8*  MCV 91.3  --  90.6 86.5 85.7  PLT 567*  --  422* 447* 0000000   Basic Metabolic Panel:  Recent Labs Lab 02/20/16 0253  02/21/16 2015 02/22/16 0035 02/22/16 0450 02/22/16 1525 02/23/16 0339  NA 136  < > 136 137 137 139 137  K 4.9  < > 2.9* 3.7 3.7 3.4* 4.2  CL 102  < > 108 107 108 107 105  CO2 10*  < > 20*  20* 20* 21* 20*  GLUCOSE 543*  < > 160* 193* 166* 185* 291*  BUN 18  < > <5* <5* <5* <5* <5*  CREATININE 0.95  < > 0.51 0.49 0.55 0.55 0.50  CALCIUM 9.7  < > 8.3* 8.5* 8.0* 8.4* 8.1*  MG 2.1  --   --   --   --   --   --   < > = values in this interval not displayed. GFR: Estimated Creatinine Clearance: 88.5 mL/min (by C-G formula based on SCr of 0.5 mg/dL). Liver Function Tests:  Recent Labs Lab 02/20/16 0253  AST 25  ALT 18  ALKPHOS 128*  BILITOT 2.3*  PROT 9.1*  ALBUMIN 4.8    Recent Labs Lab 02/20/16 0253  LIPASE 10*   No results for input(s): AMMONIA in the last 168 hours. Coagulation Profile: No results for input(s): INR, PROTIME in the last 168 hours. Cardiac Enzymes: No results for input(s): CKTOTAL, CKMB, CKMBINDEX, TROPONINI in the last 168 hours. BNP (last 3 results) No results for input(s): PROBNP in the last 8760 hours. HbA1C:  Recent Labs  02/20/16 1400  HGBA1C 9.6*   CBG:  Recent Labs Lab 02/22/16 1215 02/22/16 1616 02/22/16 2005 02/22/16 2312 02/23/16 0740  GLUCAP 202* 221* 178* 214* 390*   Lipid Profile: No results for input(s): CHOL, HDL, LDLCALC, TRIG, CHOLHDL, LDLDIRECT in the last 72 hours. Thyroid Function Tests: No results for input(s): TSH, T4TOTAL, FREET4, T3FREE, THYROIDAB in the last 72 hours. Anemia Panel: No results for input(s): VITAMINB12, FOLATE, FERRITIN, TIBC, IRON, RETICCTPCT in the last 72 hours. Sepsis Labs: No results for input(s): PROCALCITON, LATICACIDVEN in the last 168 hours.  Recent Results (from the past 240 hour(s))  MRSA PCR Screening     Status: None   Collection Time: 02/20/16  2:40 PM  Result Value Ref Range Status   MRSA by PCR NEGATIVE NEGATIVE Final    Comment:        The GeneXpert MRSA Assay (FDA approved for NASAL specimens only), is one component of a comprehensive MRSA colonization surveillance program. It is not intended to diagnose MRSA infection nor to guide or monitor treatment  for MRSA infections.   Culture, Urine     Status: Abnormal   Collection Time: 02/20/16  3:45 PM  Result Value Ref Range Status   Specimen Description URINE, CLEAN CATCH  Final   Special Requests NONE  Final   Culture >=100,000 COLONIES/mL YEAST (A)  Final   Report Status 02/22/2016 FINAL  Final         Radiology Studies: No results found.      Scheduled Meds: . cefTRIAXone (ROCEPHIN)  IV  1 g Intravenous Q24H  . escitalopram  5 mg Oral Daily  .  famotidine  20 mg Oral QHS  . fluconazole (DIFLUCAN) IV  100 mg Intravenous Q24H  . insulin aspart  0-5 Units Subcutaneous QHS  . insulin aspart  0-9 Units Subcutaneous TID WC  . insulin glargine  20 Units Subcutaneous Daily  . metoCLOPramide (REGLAN) injection  5 mg Intravenous Q6H  . mometasone-formoterol  2 puff Inhalation BID  . montelukast  10 mg Oral Daily  . pantoprazole  40 mg Intravenous Q12H  . potassium chloride  20 mEq Oral Daily  . sodium chloride  500 mL Intravenous Once  . sodium chloride  500 mL Intravenous Once   Continuous Infusions: . sodium chloride 125 mL/hr at 02/23/16 0500     LOS: 3 days    Time spent: 35 minutes.     Elmarie Shiley, MD [facility] Pager [phone]  If 7PM-7AM, please contact night-coverage [email or web address] Password [facility] 02/23/2016, 9:32 AM

## 2016-02-24 LAB — CBC
HCT: 31.9 % — ABNORMAL LOW (ref 36.0–46.0)
Hemoglobin: 11.2 g/dL — ABNORMAL LOW (ref 12.0–15.0)
MCH: 30.1 pg (ref 26.0–34.0)
MCHC: 35.1 g/dL (ref 30.0–36.0)
MCV: 85.8 fL (ref 78.0–100.0)
PLATELETS: 364 10*3/uL (ref 150–400)
RBC: 3.72 MIL/uL — ABNORMAL LOW (ref 3.87–5.11)
RDW: 14 % (ref 11.5–15.5)
WBC: 9.2 10*3/uL (ref 4.0–10.5)

## 2016-02-24 LAB — GLUCOSE, CAPILLARY
GLUCOSE-CAPILLARY: 224 mg/dL — AB (ref 65–99)
GLUCOSE-CAPILLARY: 186 mg/dL — AB (ref 65–99)
GLUCOSE-CAPILLARY: 147 mg/dL — AB (ref 65–99)
Glucose-Capillary: 329 mg/dL — ABNORMAL HIGH (ref 65–99)
Glucose-Capillary: 69 mg/dL (ref 65–99)
Glucose-Capillary: 104 mg/dL — ABNORMAL HIGH (ref 65–99)

## 2016-02-24 LAB — BASIC METABOLIC PANEL
ANION GAP: 12 (ref 5–15)
BUN: 5 mg/dL — ABNORMAL LOW (ref 6–20)
CALCIUM: 8.4 mg/dL — AB (ref 8.9–10.3)
CO2: 17 mmol/L — ABNORMAL LOW (ref 22–32)
Chloride: 105 mmol/L (ref 101–111)
Creatinine, Ser: 0.62 mg/dL (ref 0.44–1.00)
Glucose, Bld: 237 mg/dL — ABNORMAL HIGH (ref 65–99)
POTASSIUM: 3.8 mmol/L (ref 3.5–5.1)
SODIUM: 134 mmol/L — AB (ref 135–145)

## 2016-02-24 MED ORDER — STERILE WATER FOR INJECTION IV SOLN
INTRAVENOUS | Status: DC
  Administered 2016-02-24 – 2016-02-25 (×3): via INTRAVENOUS
  Filled 2016-02-24 (×5): qty 850

## 2016-02-24 MED ORDER — SODIUM CHLORIDE 0.9 % IV BOLUS (SEPSIS)
500.0000 mL | Freq: Once | INTRAVENOUS | Status: AC
  Administered 2016-02-24: 500 mL via INTRAVENOUS

## 2016-02-24 MED ORDER — INSULIN GLARGINE 100 UNIT/ML ~~LOC~~ SOLN
25.0000 [IU] | Freq: Every day | SUBCUTANEOUS | Status: DC
  Administered 2016-02-25: 25 [IU] via SUBCUTANEOUS
  Filled 2016-02-24: qty 0.25

## 2016-02-24 MED ORDER — INSULIN ASPART 100 UNIT/ML ~~LOC~~ SOLN
3.0000 [IU] | Freq: Three times a day (TID) | SUBCUTANEOUS | Status: DC
  Administered 2016-02-24 – 2016-02-25 (×2): 3 [IU] via SUBCUTANEOUS

## 2016-02-24 MED ORDER — INSULIN GLARGINE 100 UNIT/ML ~~LOC~~ SOLN
5.0000 [IU] | Freq: Once | SUBCUTANEOUS | Status: AC
  Administered 2016-02-24: 5 [IU] via SUBCUTANEOUS
  Filled 2016-02-24: qty 0.05

## 2016-02-24 NOTE — Progress Notes (Signed)
Inpatient Diabetes Program Recommendations  AACE/ADA: New Consensus Statement on Inpatient Glycemic Control (2015)  Target Ranges:  Prepandial:   less than 140 mg/dL      Peak postprandial:   less than 180 mg/dL (1-2 hours)      Critically ill patients:  140 - 180 mg/dL   Lab Results  Component Value Date   GLUCAP 224 (H) 02/24/2016   HGBA1C 9.6 (H) 02/20/2016   Review of Glycemic Control  Diabetes history: DM 1 Outpatient Diabetes medications: Basaglar 16 units, Novolog 1:10 gramscarbs, 1 units for every 50 points over 150 mg/dl Current orders for Inpatient glycemic control: Lantus 20, Novolog Sensitive + HS scale  Inpatient Diabetes Program Recommendations:   Glucose in the 200's this am. Please consider increasing Lantus to 22 units. Also consider Novolog 2-3 units TID meal coverage in addition to correction scale.  Thanks,  Tama Headings RN, MSN, [facility] Inpatient Diabetes Coordinator Team Pager [phone] (8a-5p)

## 2016-02-24 NOTE — Progress Notes (Signed)
Hypoglycemic Event  CBG: 69  Treatment: 15 GM carbohydrate snack  Symptoms: None  Follow-up CBG: Time:2245 CBG Result:104  Possible Reasons for Event: Unknown  Comments/MD notified:    Michaela Corner

## 2016-02-24 NOTE — Progress Notes (Signed)
PROGRESS NOTE    [name]  [id number] DOB: 11-17-1990 DOA: 02/20/2016 PCP: Vicenta Aly, FNP    Brief Narrative:  [name]  is a 26 y.o. female, with medical history of diabetes mellitus type 1, recent pancreatitis, liver adenomas, anxiety, hepatic steatosis, asthma , presents today with abdominal pain and coffee-ground emesis, reports she did have dental procedure yesterday, she reports nausea, vomiting, hematemesis and diarrhea over last 24 hours, had recent hospitalization for similar complaints as well, where she was found with DKA. She denies fever, chills, chest pain, dyspnea, neck pain, dysuria, polyuria or hematuria.  Assessment & Plan:   Active Problems:   DKA (diabetic ketoacidoses) (HCC)   Nausea vomiting and diarrhea   Hematemesis   Dehydration   Abdominal pain   1-DKA; DM type 1; Presents with Hyperglycemia, anion gap acidosis.  She has received IV fluids. She was initially treated with insulin Gtt.  Gap has close, but bicarb has increased to 20.   Increase lantus to 25 units. Continue with SSI.  Add meals coverage.   Abdominal pain, nausea vomiting/coffee ground emesis CT abdomen with no acute abnormalities. Mild right hydronephrosis. Marked distended urinary bladder.  IV protonix GI following. No endoscopy unless no improvement.  Increased Reglan to 10 mg IV TID> 2-04 Tolerated breakfast.   Urinary retention. Right hydronephrosis;  Placed foley catheter.  Urology consulted. She will need intermittent catheterization at discharge and follow up with urology.  Need to be teach to do in and out cath   UTI; follow urine culture. Growing 100,000 colonies yeast  Continue with ceftriaxone day3/3. Discontinue ceftriaxone.  Started fluconazole.   Asthma;  Continue with montelukast. Dulera.  Add prn albuterol.     DVT prophylaxis: SCD.  Code Status: Full Code.  Family Communication: Care discussed with patient  Disposition Plan: transfer  to telemetry    Consultants:   Urology    Procedures:   none   Antimicrobials:  Ceftriaxone 02-20-2016   Subjective: She is feeling better, no further vomiting since last night    Objective: Vitals:   02/24/16 0500 02/24/16 0600 02/24/16 0800 02/24/16 1100  BP:  (!) 133/97 127/88   Pulse: (!) 106 (!) 107 95   Resp: (!) 31 20 14    Temp:   98.5 F (36.9 C) 98.6 F (37 C)  TempSrc:   Oral Oral  SpO2: 98% 98% 99%   Weight:      Height:        Intake/Output Summary (Last 24 hours) at 02/24/16 1330 Last data filed at 02/24/16 0000  Gross per 24 hour  Intake             2250 ml  Output             2300 ml  Net              -50 ml   Filed Weights   02/20/16 0029  Weight: 52.6 kg (116 lb)    Examination:  General exam: alert, no distress.  Respiratory system: Clear to auscultation. Respiratory effort normal. Cardiovascular system: S1 & S2 heard, RRR. No JVD, murmurs, rubs, gallops or clicks. No pedal edema. Gastrointestinal system: Abdomen is nondistended, soft and nontender. No organomegaly or masses felt. Normal bowel sounds heard. Central nervous system: Alert and oriented. No focal neurological deficits. Extremities: Symmetric 5 x 5 power. Skin: No rashes, lesions or ulcers     Data Reviewed: I have personally reviewed following labs and imaging studies  CBC:  Recent  Labs Lab 02/20/16 0253 02/20/16 1400 02/21/16 0335 02/22/16 0813 02/23/16 0339 02/24/16 0342  WBC 12.4*  --  10.0 6.6 8.4 9.2  NEUTROABS 11.2*  --   --   --   --   --   HGB 13.3 11.6* 10.9* 12.7 11.2* 11.2*  HCT 42.9 34.8* 33.8* 37.9 31.8* 31.9*  MCV 91.3  --  90.6 86.5 85.7 85.8  PLT 567*  --  422* 447* 353 [id number]   Basic Metabolic Panel:  Recent Labs Lab 02/20/16 0253  02/22/16 0035 02/22/16 0450 02/22/16 1525 02/23/16 0339 02/24/16 0342  NA 136  < > 137 137 139 137 134*  K 4.9  < > 3.7 3.7 3.4* 4.2 3.8  CL 102  < > 107 108 107 105 105  CO2 10*  < > 20* 20* 21* 20*  17*  GLUCOSE 543*  < > 193* 166* 185* 291* 237*  BUN 18  < > <5* <5* <5* <5* 5*  CREATININE 0.95  < > 0.49 0.55 0.55 0.50 0.62  CALCIUM 9.7  < > 8.5* 8.0* 8.4* 8.1* 8.4*  MG 2.1  --   --   --   --   --   --   < > = values in this interval not displayed. GFR: Estimated Creatinine Clearance: 88.5 mL/min (by C-G formula based on SCr of 0.62 mg/dL). Liver Function Tests:  Recent Labs Lab 02/20/16 0253 02/23/16 1129  AST 25 17  ALT 18 10*  ALKPHOS 128* 90  BILITOT 2.3* 0.7  PROT 9.1* 5.8*  ALBUMIN 4.8 2.9*    Recent Labs Lab 02/20/16 0253  LIPASE 10*   No results for input(s): AMMONIA in the last 168 hours. Coagulation Profile: No results for input(s): INR, PROTIME in the last 168 hours. Cardiac Enzymes: No results for input(s): CKTOTAL, CKMB, CKMBINDEX, TROPONINI in the last 168 hours. BNP (last 3 results) No results for input(s): PROBNP in the last 8760 hours. HbA1C: No results for input(s): HGBA1C in the last 72 hours. CBG:  Recent Labs Lab 02/23/16 1148 02/23/16 1610 02/23/16 2129 02/24/16 0812 02/24/16 1130  GLUCAP 260* 186* 241* 224* 329*   Lipid Profile: No results for input(s): CHOL, HDL, LDLCALC, TRIG, CHOLHDL, LDLDIRECT in the last 72 hours. Thyroid Function Tests: No results for input(s): TSH, T4TOTAL, FREET4, T3FREE, THYROIDAB in the last 72 hours. Anemia Panel: No results for input(s): VITAMINB12, FOLATE, FERRITIN, TIBC, IRON, RETICCTPCT in the last 72 hours. Sepsis Labs: No results for input(s): PROCALCITON, LATICACIDVEN in the last 168 hours.  Recent Results (from the past 240 hour(s))  MRSA PCR Screening     Status: None   Collection Time: 02/20/16  2:40 PM  Result Value Ref Range Status   MRSA by PCR NEGATIVE NEGATIVE Final    Comment:        The GeneXpert MRSA Assay (FDA approved for NASAL specimens only), is one component of a comprehensive MRSA colonization surveillance program. It is not intended to diagnose MRSA infection nor to  guide or monitor treatment for MRSA infections.   Culture, Urine     Status: Abnormal   Collection Time: 02/20/16  3:45 PM  Result Value Ref Range Status   Specimen Description URINE, CLEAN CATCH  Final   Special Requests NONE  Final   Culture >=100,000 COLONIES/mL YEAST (A)  Final   Report Status 02/22/2016 FINAL  Final         Radiology Studies: No results found.      Scheduled Meds: .  escitalopram  5 mg Oral Daily  . famotidine  20 mg Oral QHS  . fluconazole (DIFLUCAN) IV  100 mg Intravenous Q24H  . insulin aspart  0-5 Units Subcutaneous QHS  . insulin aspart  0-9 Units Subcutaneous TID WC  . [START ON 02/25/2016] insulin glargine  25 Units Subcutaneous Daily  . insulin glargine  5 Units Subcutaneous Once  . metoCLOPramide (REGLAN) injection  10 mg Intravenous Q8H  . mometasone-formoterol  2 puff Inhalation BID  . montelukast  10 mg Oral Daily  . pantoprazole  40 mg Intravenous Q12H  . potassium chloride  20 mEq Oral Daily  . sodium chloride  500 mL Intravenous Once   Continuous Infusions: .  sodium bicarbonate (isotonic) infusion in sterile water 100 mL/hr at 02/24/16 0949     LOS: 4 days    Time spent: 35 minutes.     Elmarie Shiley, MD [facility] Pager [phone]  If 7PM-7AM, please contact night-coverage [email or web address] Password [email or web address] 02/24/2016, 1:30 PM

## 2016-02-25 DIAGNOSIS — R112 Nausea with vomiting, unspecified: Secondary | ICD-10-CM

## 2016-02-25 DIAGNOSIS — E0810 Diabetes mellitus due to underlying condition with ketoacidosis without coma: Secondary | ICD-10-CM

## 2016-02-25 DIAGNOSIS — R197 Diarrhea, unspecified: Secondary | ICD-10-CM

## 2016-02-25 LAB — GLUCOSE, CAPILLARY
GLUCOSE-CAPILLARY: 93 mg/dL (ref 65–99)
GLUCOSE-CAPILLARY: 192 mg/dL — AB (ref 65–99)
Glucose-Capillary: 187 mg/dL — ABNORMAL HIGH (ref 65–99)

## 2016-02-25 LAB — BASIC METABOLIC PANEL
Anion gap: 8 (ref 5–15)
BUN: 9 mg/dL (ref 6–20)
CO2: 33 mmol/L — ABNORMAL HIGH (ref 22–32)
CREATININE: 0.5 mg/dL (ref 0.44–1.00)
Calcium: 8.2 mg/dL — ABNORMAL LOW (ref 8.9–10.3)
Chloride: 98 mmol/L — ABNORMAL LOW (ref 101–111)
GFR calc Af Amer: >60 mL/min (ref >60)
Glucose, Bld: 194 mg/dL — ABNORMAL HIGH (ref 65–99)
POTASSIUM: 2.9 mmol/L — AB (ref 3.5–5.1)
SODIUM: 139 mmol/L (ref 135–145)

## 2016-02-25 MED ORDER — FLUCONAZOLE 100 MG PO TABS: 100.0000 mg | ORAL_TABLET | Freq: Every day | ORAL | 0 refills | Status: DC

## 2016-02-25 MED ORDER — BASAGLAR KWIKPEN 100 UNIT/ML ~~LOC~~ SOPN: 25.0000 [IU] | PEN_INJECTOR | Freq: Every day | SUBCUTANEOUS | 3 refills | Status: DC

## 2016-02-25 MED ORDER — METOCLOPRAMIDE HCL 10 MG PO TABS
10.0000 mg | ORAL_TABLET | Freq: Three times a day (TID) | ORAL | Status: DC
  Administered 2016-02-25: 10 mg via ORAL
  Filled 2016-02-25: qty 1

## 2016-02-25 MED ORDER — PANTOPRAZOLE SODIUM 40 MG PO TBEC: 40.0000 mg | DELAYED_RELEASE_TABLET | Freq: Two times a day (BID) | ORAL | 1 refills | Status: DC

## 2016-02-25 MED ORDER — ONDANSETRON HCL 4 MG/2ML IJ SOLN: 4.0000 mg | Freq: Four times a day (QID) | INTRAMUSCULAR | 0 refills | Status: DC | PRN

## 2016-02-25 MED ORDER — POTASSIUM CHLORIDE CRYS ER 20 MEQ PO TBCR: 20.0000 meq | EXTENDED_RELEASE_TABLET | Freq: Every day | ORAL | 0 refills | Status: DC

## 2016-02-25 MED ORDER — BISACODYL 10 MG RE SUPP: 10.0000 mg | Freq: Once | RECTAL | Status: DC

## 2016-02-25 MED ORDER — POTASSIUM CHLORIDE CRYS ER 20 MEQ PO TBCR
40.0000 meq | EXTENDED_RELEASE_TABLET | ORAL | Status: DC
  Administered 2016-02-25: 40 meq via ORAL
  Filled 2016-02-25: qty 2

## 2016-02-25 MED ORDER — MAGNESIUM OXIDE 400 MG PO TABS: 400.0000 mg | ORAL_TABLET | Freq: Every day | ORAL | 0 refills | Status: DC

## 2016-02-25 NOTE — Care Management Note (Signed)
Case Management Note  Patient Details  Name: [name] MRN: [id number] Date of Birth: November 12, 1990  Subjective/Objective: Spoke to patient in rm about d/c plans-patient states she has medicaid-noted it shows medicaid potential.She has a pcp-Dr. Chinita Pester, has a pharmacy-[facility] [address].States she can learn how to do I/o cath-nsg updated. MD ordered HHRN-patient states she can learn how to do the I/o cath. No further CM needs.                   Action/Plan:d/c home.   Expected Discharge Date:  02/25/16               Expected Discharge Plan:  Home/Self Care  In-House Referral:     Discharge planning Services  CM Consult  Post Acute Care Choice:    Choice offered to:     DME Arranged:    DME Agency:     HH Arranged:    HH Agency:     Status of Service:  Completed, signed off  If discussed at [employer] of Stay Meetings, dates discussed:    Additional Comments:  Dessa Phi, RN 02/25/2016, 12:35 PM

## 2016-02-25 NOTE — Discharge Summary (Signed)
Physician Discharge Summary  [name] [id number] DOB: Mar 28, 1990 DOA: 02/20/2016  PCP: Vicenta Aly, FNP  Admit date: 02/20/2016 Discharge date: 02/25/2016  Admitted From: Home  Disposition:  Home   Recommendations for Outpatient Follow-up:  1. Follow up with PCP in 1-2 weeks 2. Please obtain BMP/CBC in one week 3. Needs to follow up with urology for further care of urinary retention.   Home Health: yes, Nurse.    Discharge Condition stable.  CODE STATUS: Full code.  Diet recommendation:Carb Modified  Brief/Interim Summary: [name] a 26 y.o.female,with medical history of diabetes mellitus type 1, recent pancreatitis, liver adenomas, anxiety, hepatic steatosis, asthma , presents today with abdominal pain and coffee-ground emesis, reports she did have dental procedure yesterday, she reports nausea, vomiting, hematemesis and diarrhea over last 24 hours, had recent hospitalization for similar complaints as well, where she was found with DKA.She denies fever, chills, chest pain, dyspnea, neck pain, dysuria, polyuria or hematuria.  Assessment & Plan:  1-DKA; DM type 1; Presents with Hyperglycemia, anion gap acidosis.  She has received IV fluids. She was initially treated with insulin Gtt.  Gap has close, but bicarb has increased to 20.   Continue with  lantus to 25 units. meals coverage.   Abdominal pain, nausea vomiting/coffee ground emesis CT abdomen with no acute abnormalities. Mild right hydronephrosis. Marked distended urinary bladder.  IV protonix GI following. No endoscopy unless no improvement.  Increased Reglan to 10 mg IV TID> 2-04 Tolerating diet/ home today   Urinary retention. Right hydronephrosis;  Placed foley catheter.  Urology consulted. She will need intermittent catheterization at discharge and follow up with urology.  Nurse will  Teach patient how  to do in and out cath. [place] nurse arranged.   UTI; follow urine culture. Growing  100,000 colonies yeast  Continue with ceftriaxone day3/3. Discontinue ceftriaxone.  Started fluconazole. Will provide 5 days of diflucan.   Asthma;  Continue with montelukast. Dulera.  Add prn albuterol.    Discharge Diagnoses:  Active Problems:   DKA (diabetic ketoacidoses) (HCC)   Nausea vomiting and diarrhea   Hematemesis   Dehydration   Abdominal pain    Discharge Instructions  Discharge Instructions    Diet Carb Modified    Complete by:  As directed    Increase activity slowly    Complete by:  As directed      Allergies as of 02/25/2016      Reactions   Peanut-containing Drug Products Swelling   Swelling of mouth, lips   Strawberry Extract Swelling   Swelling of mouth, lips   Ultram [tramadol] Itching      Medication List    TAKE these medications   albuterol 108 (90 Base) MCG/ACT inhaler Commonly known as:  PROVENTIL HFA;VENTOLIN HFA Inhale 2 puffs into the lungs every 6 (six) hours as needed for shortness of breath.   albuterol (2.5 MG/3ML) 0.083% nebulizer solution Commonly known as:  PROVENTIL Take 2.5 mg by nebulization every 6 (six) hours as needed for shortness of breath.   BASAGLAR KWIKPEN 100 UNIT/ML Sopn Inject 0.25 mLs (25 Units total) into the skin at bedtime. What changed:  how much to take   budesonide-formoterol 160-4.5 MCG/ACT inhaler Commonly known as:  SYMBICORT Inhale 2 puffs into the lungs daily.   escitalopram 10 MG tablet Commonly known as:  LEXAPRO Take 0.5 tablets (5 mg total) by mouth daily.   famotidine 20 MG tablet Commonly known as:  PEPCID Take 1 tablet (20 mg total) by mouth at bedtime.  fluconazole 100 MG tablet Commonly known as:  DIFLUCAN Take 1 tablet (100 mg total) by mouth daily.   magnesium oxide 400 MG tablet Commonly known as:  MAG-OX Take 1 tablet (400 mg total) by mouth daily.   metoCLOPramide 10 MG tablet Commonly known as:  REGLAN Take 1 tablet (10 mg total) by mouth 3 (three) times daily  before meals.   montelukast 10 MG tablet Commonly known as:  SINGULAIR Take 10 mg by mouth daily.   NOVOLOG FLEXPEN 100 UNIT/ML FlexPen Generic drug:  insulin aspart 1 UNIT FOR EVERY 10 GRAMS CARB W/MEALS/SNACKS PLUS SLIDING SCALE: 1 UNIT GLUCOSE 50 ABOVE 150.TDD 20   ondansetron 4 MG/2ML Soln injection Commonly known as:  ZOFRAN Inject 2 mLs (4 mg total) into the vein every 6 (six) hours as needed for nausea or vomiting.   pantoprazole 40 MG tablet Commonly known as:  PROTONIX Take 1 tablet (40 mg total) by mouth 2 (two) times daily.   potassium chloride SA 20 MEQ tablet Commonly known as:  K-DUR,KLOR-CON Take 1 tablet (20 mEq total) by mouth daily.   promethazine 25 MG tablet Commonly known as:  PHENERGAN Take 1 tablet (25 mg total) by mouth every 8 (eight) hours as needed for nausea or vomiting.   Vitamin D (Ergocalciferol) 50000 units Caps capsule Commonly known as:  DRISDOL TAKE ONE CAPSULE (50,000 UNITS TOTAL) BY MOUTH ONCE A WEEK. Saturday      Follow-up Information    ANDERSON,TERESA, FNP Follow up in 1 week(s).   Specialty:  Nurse Practitioner Contact information: [address] [place] [address] [phone]        WRENN,JOHN J, MD Follow up in 1 week(s).   Specialty:  Urology Why:  call to arrange follow up  Contact information: [address]  [place] [address] [phone]          Allergies  Allergen Reactions  . Peanut-Containing Drug Products Swelling    Swelling of mouth, lips  . Strawberry Extract Swelling    Swelling of mouth, lips  . Ultram [Tramadol] Itching    Consultations:  GI    Procedures/Studies: Ct Abdomen Pelvis Wo Contrast  Result Date: 02/20/2016 CLINICAL DATA:  Diabetic patient with abdominal pain coffee ground emesis today. Recent diagnosis of pancreatitis. EXAM: CT ABDOMEN AND PELVIS WITHOUT CONTRAST TECHNIQUE: Multidetector CT imaging of the abdomen and pelvis was performed following the  standard protocol without IV contrast. COMPARISON:  CT abdomen and pelvis 11/02/2015 and 04/21/2015. MRI abdomen 11/27/2014. FINDINGS: Lower chest: Lung bases are clear. No pleural or pericardial effusion. Hepatobiliary: Status post cholecystectomy. Three low attenuating lesions in the liver measuring 1.8 cm on, 1.5 cm on image 16 image 23 and 2.5 cm on image 35 are unchanged and compatible with adenomas based on prior imaging. Pancreas: Unremarkable. No pancreatic ductal dilatation or surrounding inflammatory changes. Spleen: Normal in size without focal abnormality. Adrenals/Urinary Tract: The adrenal glands are unremarkable. Mild right hydronephrosis is presumably due to marked bladder distention which is present on the prior exams. The left kidney appears normal. No urinary tract stone is identified. Stomach/Bowel: Stomach is within normal limits. Appendix appears normal. No evidence of bowel wall thickening, distention, or inflammatory changes. Vascular/Lymphatic: No significant vascular findings are present. No enlarged abdominal or pelvic lymph nodes. Reproductive: Uterus and bilateral adnexa are unremarkable. Other: No abdominal wall hernia or abnormality. No abdominopelvic ascites. Musculoskeletal: Negative. IMPRESSION: No acute abnormality. Mild right hydronephrosis is presumably due to marked distention of  the urinary bladder, a chronic abnormality. Cause for distention is not identified. No change in 3 liver lesions presumably representing adenomas based on prior exams. Electronically Signed   By: Inge Rise M.D.   On: 02/20/2016 12:29   Dg Chest Port 1 View  Result Date: 02/20/2016 CLINICAL DATA:  Nausea and vomiting. EXAM: PORTABLE CHEST 1 VIEW COMPARISON:  01/17/2016 FINDINGS: The cardiomediastinal contours are normal. Bronchial thickening. Pulmonary vasculature is normal. No consolidation, pleural effusion, or pneumothorax. No acute osseous abnormalities are seen. IMPRESSION: Bronchial  thickening.  No focal airspace consolidation Electronically Signed   By: Jeb Levering M.D.   On: 02/20/2016 04:40   Dg Abd 2 Views  Result Date: 02/05/2016 CLINICAL DATA:  Intractable nausea and vomiting. Mid abdominal pain. EXAM: ABDOMEN - 2 VIEW COMPARISON:  Most recent radiographs 01/17/2016. Most recent CT 11/02/2015 FINDINGS: No evidence of free air on decubitus view. There is air within the stomach which is nondistended. Mottled appearing air to the right of the upper lumbar spine is nonspecific. Relative paucity of bowel gas in the lower abdomen and pelvis. Pelvic phlebolith is noted. Cholecystectomy clips in the right upper quadrant. No acute osseous abnormalities IMPRESSION: Mottled bowel air in the right upper abdomen, may be air within small bowel/duodenum, however is nonspecific. If there is concern for to duodenal perforation, recommend CT. There is otherwise a relative paucity of bowel gas in the lower abdomen and pelvis. Electronically Signed   By: Jeb Levering M.D.   On: 02/05/2016 04:36      Subjective: Tolerating diet, nausea and vomiting has resolved.   Discharge Exam: Vitals:   02/25/16 0525 02/25/16 1140  BP: 120/85 120/82  Pulse: 90 95  Resp: 18 18  Temp: 98.5 F (36.9 C) 98.4 F (36.9 C)   Vitals:   02/24/16 1600 02/24/16 2210 02/25/16 0525 02/25/16 1140  BP: 124/87 117/74 120/85 120/82  Pulse: (!) 109 (!) 102 90 95  Resp: 16 18 18 18   Temp: 98.5 F (36.9 C) 98.2 F (36.8 C) 98.5 F (36.9 C) 98.4 F (36.9 C)  TempSrc: Oral Oral Oral Oral  SpO2: 100% 100% 100% 100%  Weight:      Height:        General: Pt is alert, awake, not in acute distress Cardiovascular: RRR, S1/S2 +, no rubs, no gallops Respiratory: CTA bilaterally, no wheezing, no rhonchi Abdominal: Soft, NT, ND, bowel sounds + Extremities: no edema, no cyanosis    The results of significant diagnostics from this hospitalization (including imaging, microbiology, ancillary and  laboratory) are listed below for reference.     Microbiology: Recent Results (from the past 240 hour(s))  MRSA PCR Screening     Status: None   Collection Time: 02/20/16  2:40 PM  Result Value Ref Range Status   MRSA by PCR NEGATIVE NEGATIVE Final    Comment:        The GeneXpert MRSA Assay (FDA approved for NASAL specimens only), is one component of a comprehensive MRSA colonization surveillance program. It is not intended to diagnose MRSA infection nor to guide or monitor treatment for MRSA infections.   Culture, Urine     Status: Abnormal   Collection Time: 02/20/16  3:45 PM  Result Value Ref Range Status   Specimen Description URINE, CLEAN CATCH  Final   Special Requests NONE  Final   Culture >=100,000 COLONIES/mL YEAST (A)  Final   Report Status 02/22/2016 FINAL  Final     Labs: BNP (last 3  results)  Recent Labs  04/11/15 0325 07/11/15 0353  BNP 43.5 6.9   Basic Metabolic Panel:  Recent Labs Lab 02/20/16 0253  02/22/16 0450 02/22/16 1525 02/23/16 0339 02/24/16 0342 02/25/16 0817  NA 136  < > 137 139 137 134* 139  K 4.9  < > 3.7 3.4* 4.2 3.8 2.9*  CL 102  < > 108 107 105 105 98*  CO2 10*  < > 20* 21* 20* 17* 33*  GLUCOSE 543*  < > 166* 185* 291* 237* 194*  BUN 18  < > <5* <5* <5* 5* 9  CREATININE 0.95  < > 0.55 0.55 0.50 0.62 0.50  CALCIUM 9.7  < > 8.0* 8.4* 8.1* 8.4* 8.2*  MG 2.1  --   --   --   --   --   --   < > = values in this interval not displayed. Liver Function Tests:  Recent Labs Lab 02/20/16 0253 02/23/16 1129  AST 25 17  ALT 18 10*  ALKPHOS 128* 90  BILITOT 2.3* 0.7  PROT 9.1* 5.8*  ALBUMIN 4.8 2.9*    Recent Labs Lab 02/20/16 0253  LIPASE 10*   No results for input(s): AMMONIA in the last 168 hours. CBC:  Recent Labs Lab 02/20/16 0253 02/20/16 1400 02/21/16 0335 02/22/16 0813 02/23/16 0339 02/24/16 0342  WBC 12.4*  --  10.0 6.6 8.4 9.2  NEUTROABS 11.2*  --   --   --   --   --   HGB 13.3 11.6* 10.9* 12.7 11.2*  11.2*  HCT 42.9 34.8* 33.8* 37.9 31.8* 31.9*  MCV 91.3  --  90.6 86.5 85.7 85.8  PLT 567*  --  422* 447* 353 364   Cardiac Enzymes: No results for input(s): CKTOTAL, CKMB, CKMBINDEX, TROPONINI in the last 168 hours. BNP: Invalid input(s): POCBNP CBG:  Recent Labs Lab 02/24/16 2206 02/24/16 2247 02/25/16 0142 02/25/16 0731 02/25/16 1139  GLUCAP 69 104* 93 187* 192*   D-Dimer No results for input(s): DDIMER in the last 72 hours. Hgb A1c No results for input(s): HGBA1C in the last 72 hours. Lipid Profile No results for input(s): CHOL, HDL, LDLCALC, TRIG, CHOLHDL, LDLDIRECT in the last 72 hours. Thyroid function studies No results for input(s): TSH, T4TOTAL, T3FREE, THYROIDAB in the last 72 hours.  Invalid input(s): FREET3 Anemia work up No results for input(s): VITAMINB12, FOLATE, FERRITIN, TIBC, IRON, RETICCTPCT in the last 72 hours. Urinalysis    Component Value Date/Time   COLORURINE YELLOW 02/20/2016 0422   APPEARANCEUR CLOUDY (A) 02/20/2016 0422   LABSPEC 1.023 02/20/2016 0422   PHURINE 5.0 02/20/2016 0422   GLUCOSEU >=500 (A) 02/20/2016 0422   GLUCOSEU >=1000 11/07/2012 1205   HGBUR LARGE (A) 02/20/2016 0422   BILIRUBINUR NEGATIVE 02/20/2016 0422   KETONESUR 80 (A) 02/20/2016 0422   PROTEINUR 100 (A) 02/20/2016 0422   UROBILINOGEN 0.2 11/24/2014 1045   NITRITE NEGATIVE 02/20/2016 0422   LEUKOCYTESUR LARGE (A) 02/20/2016 0422   Sepsis Labs Invalid input(s): PROCALCITONIN,  WBC,  LACTICIDVEN Microbiology Recent Results (from the past 240 hour(s))  MRSA PCR Screening     Status: None   Collection Time: 02/20/16  2:40 PM  Result Value Ref Range Status   MRSA by PCR NEGATIVE NEGATIVE Final    Comment:        The GeneXpert MRSA Assay (FDA approved for NASAL specimens only), is one component of a comprehensive MRSA colonization surveillance program. It is not intended to diagnose MRSA infection nor to  guide or monitor treatment for MRSA infections.    Culture, Urine     Status: Abnormal   Collection Time: 02/20/16  3:45 PM  Result Value Ref Range Status   Specimen Description URINE, CLEAN CATCH  Final   Special Requests NONE  Final   Culture >=100,000 COLONIES/mL YEAST (A)  Final   Report Status 02/22/2016 FINAL  Final     Time coordinating discharge: Over 30 minutes  SIGNED:   Elmarie Shiley, MD  [facility] 02/25/2016, 1:38 PM Pager   If 7PM-7AM, please contact night-coverage [email or web address] Password [email or web address]

## 2016-02-25 NOTE — Progress Notes (Signed)
Patient's discharge paperwork and instructions gone over with pt.   I/O teaching done at bedside pt was able to successfully cath herself in her first attempt and did not want to try anymore.  Patient's prescriptions printed and given with discharge paperwork.  Patient taken to front of hospital via wheelchair by NT where family was waiting.

## 2016-02-28 ENCOUNTER — Emergency Department (HOSPITAL_COMMUNITY)
Admission: EM | Admit: 2016-02-28 | Discharge: 2016-02-29 | Disposition: A | Discharge status: Home / Self Care | Payer: Medicaid Other | Source: Home / Self Care | Attending: Emergency Medicine | Admitting: Emergency Medicine

## 2016-02-28 ENCOUNTER — Encounter (HOSPITAL_COMMUNITY): Payer: Self-pay

## 2016-02-28 DIAGNOSIS — Z91010 Allergy to peanuts: Secondary | ICD-10-CM | POA: Insufficient documentation

## 2016-02-28 DIAGNOSIS — J45909 Unspecified asthma, uncomplicated: Secondary | ICD-10-CM | POA: Insufficient documentation

## 2016-02-28 DIAGNOSIS — E1043 Type 1 diabetes mellitus with diabetic autonomic (poly)neuropathy: Secondary | ICD-10-CM

## 2016-02-28 DIAGNOSIS — R8281 Pyuria: Secondary | ICD-10-CM

## 2016-02-28 DIAGNOSIS — I10 Essential (primary) hypertension: Secondary | ICD-10-CM

## 2016-02-28 DIAGNOSIS — Z79899 Other long term (current) drug therapy: Secondary | ICD-10-CM | POA: Insufficient documentation

## 2016-02-28 DIAGNOSIS — R1084 Generalized abdominal pain: Secondary | ICD-10-CM | POA: Insufficient documentation

## 2016-02-28 DIAGNOSIS — N390 Urinary tract infection, site not specified: Secondary | ICD-10-CM

## 2016-02-28 DIAGNOSIS — K3184 Gastroparesis: Secondary | ICD-10-CM

## 2016-02-28 HISTORY — DX: Gastroparesis: K31.84

## 2016-02-28 LAB — COMPREHENSIVE METABOLIC PANEL
ALT: 13 U/L — AB (ref 14–54)
AST: 33 U/L (ref 15–41)
Albumin: 4.3 g/dL (ref 3.5–5.0)
Alkaline Phosphatase: 105 U/L (ref 38–126)
Anion gap: 18 — ABNORMAL HIGH (ref 5–15)
BUN: 19 mg/dL (ref 6–20)
CHLORIDE: 99 mmol/L — AB (ref 101–111)
CO2: 23 mmol/L (ref 22–32)
CREATININE: 0.76 mg/dL (ref 0.44–1.00)
Calcium: 9.9 mg/dL (ref 8.9–10.3)
Glucose, Bld: 127 mg/dL — ABNORMAL HIGH (ref 65–99)
POTASSIUM: 3.3 mmol/L — AB (ref 3.5–5.1)
SODIUM: 140 mmol/L (ref 135–145)
Total Bilirubin: 0.6 mg/dL (ref 0.3–1.2)
Total Protein: 8.1 g/dL (ref 6.5–8.1)

## 2016-02-28 LAB — CBC
HEMATOCRIT: 38 % (ref 36.0–46.0)
Hemoglobin: 13 g/dL (ref 12.0–15.0)
MCH: 30.1 pg (ref 26.0–34.0)
MCHC: 34.2 g/dL (ref 30.0–36.0)
MCV: 88 fL (ref 78.0–100.0)
PLATELETS: 580 10*3/uL — AB (ref 150–400)
RBC: 4.32 MIL/uL (ref 3.87–5.11)
RDW: 14.3 % (ref 11.5–15.5)
WBC: 12.6 10*3/uL — AB (ref 4.0–10.5)

## 2016-02-28 LAB — CBG MONITORING, ED
Glucose-Capillary: 114 mg/dL — ABNORMAL HIGH (ref 65–99)
Glucose-Capillary: 135 mg/dL — ABNORMAL HIGH (ref 65–99)

## 2016-02-28 LAB — LIPASE, BLOOD: LIPASE: 12 U/L (ref 11–51)

## 2016-02-28 MED ORDER — ONDANSETRON 4 MG PO TBDP
4.0000 mg | ORAL_TABLET | Freq: Once | ORAL | Status: AC | PRN
  Administered 2016-02-28: 4 mg via ORAL
  Filled 2016-02-28: qty 1

## 2016-02-28 MED ORDER — FAMOTIDINE IN NACL 20-0.9 MG/50ML-% IV SOLN
20.0000 mg | Freq: Once | INTRAVENOUS | Status: AC
  Administered 2016-02-28: 20 mg via INTRAVENOUS
  Filled 2016-02-28: qty 50

## 2016-02-28 MED ORDER — SODIUM CHLORIDE 0.9 % IV BOLUS (SEPSIS)
1000.0000 mL | Freq: Once | INTRAVENOUS | Status: AC
  Administered 2016-02-28: 1000 mL via INTRAVENOUS

## 2016-02-28 MED ORDER — KETOROLAC TROMETHAMINE 30 MG/ML IJ SOLN
30.0000 mg | Freq: Once | INTRAMUSCULAR | Status: AC
  Administered 2016-02-28: 30 mg via INTRAVENOUS
  Filled 2016-02-28: qty 1

## 2016-02-28 MED ORDER — DICYCLOMINE HCL 10 MG/ML IM SOLN
20.0000 mg | Freq: Once | INTRAMUSCULAR | Status: AC
  Administered 2016-02-28: 20 mg via INTRAMUSCULAR
  Filled 2016-02-28: qty 2

## 2016-02-28 MED ORDER — HALOPERIDOL LACTATE 5 MG/ML IJ SOLN
5.0000 mg | Freq: Once | INTRAMUSCULAR | Status: AC
  Administered 2016-02-28: 5 mg via INTRAVENOUS
  Filled 2016-02-28: qty 1

## 2016-02-28 NOTE — ED Notes (Signed)
Bed: [id number] Expected date:  Expected time:  Means of arrival:  Comments: NO BED

## 2016-02-28 NOTE — ED Provider Notes (Signed)
[facility] DEPT Provider Note   CSN: [id number] Arrival date & time: 02/28/16  2101  By signing my name below, I, Jeanell Sparrow, attest that this documentation has been prepared under the direction and in the presence of non-physician practitioner, Antonietta Breach, PA-C. Electronically Signed: Jeanell Sparrow, Scribe. 02/28/2016. 10:10 PM.   History   Chief Complaint Chief Complaint  Patient presents with  . Emesis    The history is provided by the patient. No language interpreter was used.    HPI Comments: [name] is a 26 y.o. female who presents to the Emergency Department complaining of abdominal pain and vomiting. Patient states that symptoms began today. She has had constant abdominal pain which is diffuse. She states that it hurts "all over". She denies any noticeable blood in her emesis. She last took her insulin about 2 hours ago. She took Reglan PTA without relief. She denies any other complaints. Patient has been seen in the emergency department multiple times for similar complaints. She was most recently discharged on 02/25/2016 after an admission for abdominal pain, vomiting, and ketoacidosis.  PCP: Vicenta Aly, FNP   Past Medical History:  Diagnosis Date  . Anxiety   . Arthritis   . Asthma   . Diabetes mellitus 2007   IDDM.  poorly controlled, multiple admits with DKA  . Gallstones   . Gastroparesis   . Heart murmur   . Hepatic steatosis 11/26/2014   and hepatomegaly  . Hypertension    NOT CURRENTLY ON ANY BP MED  . Liver mass 11/26/2014  . Pancreatitis, acute 11/26/2014    Patient Active Problem List   Diagnosis Date Noted  . UTI (urinary tract infection) 01/22/2016  . Depression   . Diarrhea 11/09/2015  . Acute urinary retention   . Gastroparesis due to DM ([place]) 07/10/2015  . GERD (gastroesophageal reflux disease) 07/10/2015  . Depression with anxiety 07/10/2015  . Uncontrolled type 1 diabetes mellitus with hyperglycemia ([place])   . Gastroparesis  06/22/2015  . Altered mental status 06/22/2015  . Volume depletion 06/10/2015  . Protein-calorie malnutrition, severe 06/10/2015  . Hyperglycemia   . Intractable vomiting with nausea   . Elevated bilirubin   . Type 1 diabetes mellitus with hyperglycemia ([place]) 05/29/2015  . Hematemesis with nausea   . Intractable vomiting 05/28/2015  . Intractable nausea and vomiting 05/28/2015  . Abdominal pain in female   . Abdominal pain 05/24/2015  . Hypertension 05/24/2015  . Dehydration   . Diabetic gastroparesis ([place])   . Chronic diastolic heart failure ([place]) 04/11/2015  . Hematemesis 04/08/2015  . DKA (diabetic ketoacidoses) ([place]) 03/22/2015  . Nausea vomiting and diarrhea 03/22/2015  . Nausea & vomiting   . Diabetic ketoacidosis without coma associated with type 1 diabetes mellitus ([place])   . S/P laparoscopic cholecystectomy 02/11/2015  . Status post laparoscopic cholecystectomy 02/11/2015  . Postextubation stridor   . Nausea and vomiting 11/27/2014  . Pancreatitis, acute 11/26/2014  . Volume overload 11/26/2014  . Hypokalemia 11/26/2014  . Hepatic steatosis 11/26/2014  . Liver mass 11/26/2014  . Sepsis ([place]) 11/25/2014  . Sinus tachycardia 11/25/2014  . Hypomagnesemia 11/25/2014  . Hypophosphatemia 11/25/2014  . Elevated amylase and lipase 11/25/2014  . DKA, type 1 ([place]) 11/24/2014  . Elevated LFTs 11/24/2014  . Acute kidney injury ([place]) 11/24/2014  . Migraine headache 11/24/2014  . Asthma 06/29/2012  . Uncontrolled type 1 diabetes mellitus ([place]) 06/19/2010  . Goiter, unspecified 06/19/2010    Past Surgical History:  Procedure Laterality Date  . CHOLECYSTECTOMY  N/A 02/11/2015   Procedure: LAPAROSCOPIC CHOLECYSTECTOMY WITH INTRAOPERATIVE CHOLANGIOGRAM;  Surgeon: Greer Pickerel, MD;  Location: [facility] ORS;  Service: General;  Laterality: N/A;  . ESOPHAGOGASTRODUODENOSCOPY (EGD) WITH PROPOFOL Left 09/20/2014   Procedure: ESOPHAGOGASTRODUODENOSCOPY (EGD) WITH PROPOFOL;  Surgeon: Arta Silence, MD;  Location: [facility] ENDOSCOPY;  Service: Endoscopy;  Laterality: Left;  . WISDOM TOOTH EXTRACTION      OB History    Gravida Para Term Preterm AB Living   2 1 0 1 1 1    SAB TAB Ectopic Multiple Live Births   0 1 0 0 1       Home Medications    Prior to Admission medications   Medication Sig Start Date End Date Taking? Authorizing Provider  albuterol (PROVENTIL HFA;VENTOLIN HFA) 108 (90 BASE) MCG/ACT inhaler Inhale 2 puffs into the lungs every 6 (six) hours as needed for shortness of breath.   Yes Historical Provider, MD  albuterol (PROVENTIL) (2.5 MG/3ML) 0.083% nebulizer solution Take 2.5 mg by nebulization every 6 (six) hours as needed for shortness of breath.   Yes Historical Provider, MD  budesonide-formoterol (SYMBICORT) 160-4.5 MCG/ACT inhaler Inhale 2 puffs into the lungs daily.    Yes Historical Provider, MD  escitalopram (LEXAPRO) 10 MG tablet Take 0.5 tablets (5 mg total) by mouth daily. 01/19/16  Yes Barton Dubois, MD  fluconazole (DIFLUCAN) 100 MG tablet Take 1 tablet (100 mg total) by mouth daily. 02/25/16  Yes Belkys A Regalado, MD  Insulin Glargine (BASAGLAR KWIKPEN) 100 UNIT/ML SOPN Inject 0.25 mLs (25 Units total) into the skin at bedtime. 02/25/16  Yes Belkys A Regalado, MD  montelukast (SINGULAIR) 10 MG tablet Take 10 mg by mouth daily.   Yes Historical Provider, MD  NOVOLOG FLEXPEN 100 UNIT/ML FlexPen 1 UNIT FOR EVERY 10 GRAMS CARB W/MEALS/SNACKS PLUS SLIDING SCALE: 1 UNIT GLUCOSE 50 ABOVE 150.TDD 20 12/26/15  Yes Historical Provider, MD  pantoprazole (PROTONIX) 40 MG tablet Take 1 tablet (40 mg total) by mouth 2 (two) times daily. 02/25/16  Yes Belkys A Regalado, MD  potassium chloride SA (K-DUR,KLOR-CON) 20 MEQ tablet Take 1 tablet (20 mEq total) by mouth daily. 02/25/16  Yes Belkys A Regalado, MD  Vitamin D, Ergocalciferol, (DRISDOL) 50000 units CAPS capsule TAKE ONE CAPSULE (50,000 UNITS TOTAL) BY MOUTH ONCE A WEEK. Saturday 12/27/15  Yes Historical Provider, MD    famotidine (PEPCID) 20 MG tablet Take 1 tablet (20 mg total) by mouth at bedtime. 02/29/16   Antonietta Breach, PA-C  magnesium oxide (MAG-OX) 400 MG tablet Take 1 tablet (400 mg total) by mouth daily. 02/25/16   Belkys A Regalado, MD  metoCLOPramide (REGLAN) 10 MG tablet Take 1 tablet (10 mg total) by mouth 3 (three) times daily before meals. 02/29/16   Antonietta Breach, PA-C    Family History Family History  Problem Relation Age of Onset  . Heart disease Maternal Grandmother   . Heart disease Maternal Grandfather   . Diabetes Mother   . Hyperlipidemia Mother   . Hypertension Father   . Heart disease Father   . Hypertension Paternal Grandmother   . Cancer Paternal Grandfather     Social History Social History  Substance Use Topics  . Smoking status: Never Smoker  . Smokeless tobacco: Never Used  . Alcohol use No     Allergies   Peanut-containing drug products; Strawberry extract; and Ultram [tramadol]   Review of Systems Review of Systems A complete 10 system review of systems was obtained and all systems are negative except as  noted in the HPI and PMH.    Physical Exam Updated Vital Signs BP 181/62 (BP Location: Left Arm)   Pulse 71   Temp 98.2 F (36.8 C) (Axillary)   Resp 20   Ht 5\' 4"  (1.626 m)   Wt 125 lb (56.7 kg)   LMP 02/19/2016   SpO2 100%   BMI 21.46 kg/m   Physical Exam  Constitutional: She is oriented to person, place, and time. She appears well-developed and well-nourished. No distress.  HENT:  Head: Normocephalic and atraumatic.  Mildly dry mucous membranes  Eyes: Conjunctivae and EOM are normal. No scleral icterus.  Neck: Normal range of motion.  Cardiovascular: Regular rhythm and intact distal pulses.   Tachycardic rate  Pulmonary/Chest: Effort normal. No respiratory distress. She has no wheezes. She has no rales.  Respirations even and unlabored. Lungs CTAB.  Musculoskeletal: Normal range of motion.  Neurological: She is alert and oriented to person,  place, and time. She exhibits normal muscle tone. Coordination normal.  GCS 15. Patient moving all extremities.  Skin: Skin is warm and dry. No rash noted. She is not diaphoretic. No erythema. No pallor.  Psychiatric: She has a normal mood and affect. Her behavior is normal.  Nursing note and vitals reviewed.    ED Treatments / Results  DIAGNOSTIC STUDIES: Oxygen Saturation is 100% on RA, normal by my interpretation.    COORDINATION OF CARE: 10:14 PM- Pt advised of plan for treatment and pt agrees.  Labs (all labs ordered are listed, but only abnormal results are displayed) Labs Reviewed  COMPREHENSIVE METABOLIC PANEL - Abnormal; Notable for the following:       Result Value   Potassium 3.3 (*)    Chloride 99 (*)    Glucose, Bld 127 (*)    ALT 13 (*)    Anion gap 18 (*)    All other components within normal limits  CBC - Abnormal; Notable for the following:    WBC 12.6 (*)    Platelets 580 (*)    All other components within normal limits  URINALYSIS, ROUTINE W REFLEX MICROSCOPIC - Abnormal; Notable for the following:    Color, Urine STRAW (*)    APPearance CLOUDY (*)    Glucose, UA >=500 (*)    Ketones, ur 80 (*)    Protein, ur 30 (*)    Leukocytes, UA LARGE (*)    All other components within normal limits  CBG MONITORING, ED - Abnormal; Notable for the following:    Glucose-Capillary 135 (*)    All other components within normal limits  CBG MONITORING, ED - Abnormal; Notable for the following:    Glucose-Capillary 114 (*)    All other components within normal limits  LIPASE, BLOOD  RAPID URINE DRUG SCREEN, HOSP PERFORMED    EKG  EKG Interpretation None       Radiology No results found.  Procedures Procedures (including critical care time)  Medications Ordered in ED Medications  fluconazole (DIFLUCAN) tablet 150 mg (not administered)  ondansetron (ZOFRAN-ODT) disintegrating tablet 4 mg (4 mg Oral Given 02/28/16 2122)  haloperidol lactate (HALDOL)  injection 5 mg (5 mg Intravenous Given 02/28/16 2222)  famotidine (PEPCID) IVPB 20 mg premix (0 mg Intravenous Stopped 02/28/16 2253)  sodium chloride 0.9 % bolus 1,000 mL (0 mLs Intravenous Stopped 02/29/16 0100)  dicyclomine (BENTYL) injection 20 mg (20 mg Intramuscular Given 02/28/16 2228)  ketorolac (TORADOL) 30 MG/ML injection 30 mg (30 mg Intravenous Given 02/28/16 2358)  metoCLOPramide (REGLAN) injection  10 mg (10 mg Intravenous Given 02/29/16 0125)  sodium chloride 0.9 % bolus 1,000 mL (1,000 mLs Intravenous New Bag/Given 02/29/16 0442)  ondansetron (ZOFRAN) injection 4 mg (4 mg Intravenous Given 02/29/16 0442)     Initial Impression / Assessment and Plan / ED Course  I have reviewed the triage vital signs and the nursing notes.  Pertinent labs & imaging results that were available during my care of the patient were reviewed by me and considered in my medical decision making (see chart for details).     Patient presents to the emergency department for evaluation of abdominal pain, nausea, and vomiting. She has been seen in the emergency department numerous times for similar symptoms. She was just discharged 3 days ago after admission for same. Leukocytosis is consistent with baseline at ED presentation. She has no evidence of diabetic ketoacidosis. Mild hypokalemia is chronic. The patient has been hydrated in the emergency department with at least 2L of IV fluids. Her symptoms have resolved with medical management.  Patient given a tablet of Diflucan prior to discharge. She was noted to have pyuria at the beginning of January secondary to yeast. This is likely the cause of patient's pyuria today. She has no complaints of dysuria. No bacteria or nitrites. Urine culture ordered. Patient to be discharged with instruction for outpatient primary care follow-up. Return precautions discussed and provided. Patient discharged in stable condition.   Final Clinical Impressions(s) / ED Diagnoses   Final  diagnoses:  Generalized abdominal pain  Gastroparesis  Pyuria    New Prescriptions Current Discharge Medication List     I personally performed the services described in this documentation, which was scribed in my presence. The recorded information has been reviewed and is accurate.        Antonietta Breach, PA-C 02/29/16 [id number]    Malvin Johns, MD 02/29/16 [phone]

## 2016-02-28 NOTE — ED Triage Notes (Signed)
PT C/O NON-STOP VOMITING X1 HR PTA. PT STS SHE HAS A HX OF GASTROPARESIS.

## 2016-02-29 ENCOUNTER — Observation Stay (HOSPITAL_COMMUNITY): Payer: Medicaid Other

## 2016-02-29 ENCOUNTER — Encounter (HOSPITAL_COMMUNITY): Payer: Self-pay

## 2016-02-29 ENCOUNTER — Inpatient Hospital Stay (HOSPITAL_COMMUNITY)
Admission: EM | Admit: 2016-02-29 | Discharge: 2016-03-04 | DRG: 638 | Disposition: A | Discharge status: Home / Self Care | Payer: Medicaid Other | Attending: Family Medicine | Admitting: Family Medicine

## 2016-02-29 DIAGNOSIS — R339 Retention of urine, unspecified: Secondary | ICD-10-CM

## 2016-02-29 DIAGNOSIS — N1330 Unspecified hydronephrosis: Secondary | ICD-10-CM | POA: Diagnosis present

## 2016-02-29 DIAGNOSIS — E0810 Diabetes mellitus due to underlying condition with ketoacidosis without coma: Secondary | ICD-10-CM

## 2016-02-29 DIAGNOSIS — Z8249 Family history of ischemic heart disease and other diseases of the circulatory system: Secondary | ICD-10-CM

## 2016-02-29 DIAGNOSIS — Z794 Long term (current) use of insulin: Secondary | ICD-10-CM

## 2016-02-29 DIAGNOSIS — E1010 Type 1 diabetes mellitus with ketoacidosis without coma: Secondary | ICD-10-CM | POA: Diagnosis present

## 2016-02-29 DIAGNOSIS — E876 Hypokalemia: Secondary | ICD-10-CM | POA: Diagnosis present

## 2016-02-29 DIAGNOSIS — Z833 Family history of diabetes mellitus: Secondary | ICD-10-CM

## 2016-02-29 DIAGNOSIS — J45909 Unspecified asthma, uncomplicated: Secondary | ICD-10-CM | POA: Diagnosis present

## 2016-02-29 DIAGNOSIS — I110 Hypertensive heart disease with heart failure: Secondary | ICD-10-CM | POA: Diagnosis present

## 2016-02-29 DIAGNOSIS — E1043 Type 1 diabetes mellitus with diabetic autonomic (poly)neuropathy: Secondary | ICD-10-CM | POA: Diagnosis present

## 2016-02-29 DIAGNOSIS — Z9114 Patient's other noncompliance with medication regimen: Secondary | ICD-10-CM

## 2016-02-29 DIAGNOSIS — E1143 Type 2 diabetes mellitus with diabetic autonomic (poly)neuropathy: Secondary | ICD-10-CM | POA: Diagnosis present

## 2016-02-29 DIAGNOSIS — I5032 Chronic diastolic (congestive) heart failure: Secondary | ICD-10-CM | POA: Diagnosis present

## 2016-02-29 DIAGNOSIS — K3184 Gastroparesis: Secondary | ICD-10-CM | POA: Diagnosis present

## 2016-02-29 DIAGNOSIS — Z9049 Acquired absence of other specified parts of digestive tract: Secondary | ICD-10-CM

## 2016-02-29 DIAGNOSIS — K760 Fatty (change of) liver, not elsewhere classified: Secondary | ICD-10-CM | POA: Diagnosis present

## 2016-02-29 DIAGNOSIS — F419 Anxiety disorder, unspecified: Secondary | ICD-10-CM | POA: Diagnosis present

## 2016-02-29 DIAGNOSIS — A419 Sepsis, unspecified organism: Secondary | ICD-10-CM | POA: Diagnosis present

## 2016-02-29 DIAGNOSIS — R6510 Systemic inflammatory response syndrome (SIRS) of non-infectious origin without acute organ dysfunction: Secondary | ICD-10-CM | POA: Diagnosis present

## 2016-02-29 DIAGNOSIS — E860 Dehydration: Secondary | ICD-10-CM | POA: Diagnosis present

## 2016-02-29 DIAGNOSIS — R338 Other retention of urine: Secondary | ICD-10-CM | POA: Diagnosis present

## 2016-02-29 DIAGNOSIS — D72829 Elevated white blood cell count, unspecified: Secondary | ICD-10-CM

## 2016-02-29 LAB — RAPID URINE DRUG SCREEN, HOSP PERFORMED
AMPHETAMINES: NOT DETECTED
Amphetamines: NOT DETECTED
BARBITURATES: NOT DETECTED
BENZODIAZEPINES: NOT DETECTED
BENZODIAZEPINES: NOT DETECTED
Barbiturates: NOT DETECTED
COCAINE: NOT DETECTED
COCAINE: NOT DETECTED
OPIATES: NOT DETECTED
Opiates: NOT DETECTED
TETRAHYDROCANNABINOL: NOT DETECTED
TETRAHYDROCANNABINOL: NOT DETECTED

## 2016-02-29 LAB — BASIC METABOLIC PANEL
Anion gap: 14 (ref 5–15)
BUN: 10 mg/dL (ref 6–20)
CO2: 11 mmol/L — AB (ref 22–32)
Calcium: 8.6 mg/dL — ABNORMAL LOW (ref 8.9–10.3)
Chloride: 113 mmol/L — ABNORMAL HIGH (ref 101–111)
Creatinine, Ser: 0.89 mg/dL (ref 0.44–1.00)
GFR calc Af Amer: >60 mL/min (ref >60)
GFR calc non Af Amer: >60 mL/min (ref >60)
Glucose, Bld: 174 mg/dL — ABNORMAL HIGH (ref 65–99)
POTASSIUM: 4.4 mmol/L (ref 3.5–5.1)
SODIUM: 138 mmol/L (ref 135–145)

## 2016-02-29 LAB — CBC WITH DIFFERENTIAL/PLATELET
BASOS ABS: 0 10*3/uL (ref 0.0–0.1)
BASOS PCT: 0 %
EOS PCT: 0 %
Eosinophils Absolute: 0 10*3/uL (ref 0.0–0.7)
HCT: 38.1 % (ref 36.0–46.0)
Hemoglobin: 12.7 g/dL (ref 12.0–15.0)
Lymphocytes Relative: 14 %
Lymphs Abs: 2.2 10*3/uL (ref 0.7–4.0)
MCH: 30 pg (ref 26.0–34.0)
MCHC: 33.3 g/dL (ref 30.0–36.0)
MCV: 89.9 fL (ref 78.0–100.0)
MONO ABS: 1 10*3/uL (ref 0.1–1.0)
Monocytes Relative: 7 %
Neutro Abs: 12.2 10*3/uL — ABNORMAL HIGH (ref 1.7–7.7)
Neutrophils Relative %: 79 %
PLATELETS: 427 10*3/uL — AB (ref 150–400)
RBC: 4.24 MIL/uL (ref 3.87–5.11)
RDW: 14.7 % (ref 11.5–15.5)
WBC: 15.5 10*3/uL — AB (ref 4.0–10.5)

## 2016-02-29 LAB — BLOOD GAS, VENOUS
Acid-base deficit: 14.1 mmol/L — ABNORMAL HIGH (ref 0.0–2.0)
Bicarbonate: 11 mmol/L — ABNORMAL LOW (ref 20.0–28.0)
FIO2: 21
O2 Saturation: 55.7 %
PATIENT TEMPERATURE: 98.6
pCO2, Ven: 24.2 mmHg — ABNORMAL LOW (ref 44.0–60.0)
pH, Ven: 7.279 (ref 7.250–7.430)
pO2, Ven: 31.4 mmHg — CL (ref 32.0–45.0)

## 2016-02-29 LAB — CBG MONITORING, ED
Glucose-Capillary: 390 mg/dL — ABNORMAL HIGH (ref 65–99)
Glucose-Capillary: 463 mg/dL — ABNORMAL HIGH (ref 65–99)
Glucose-Capillary: 348 mg/dL — ABNORMAL HIGH (ref 65–99)
Glucose-Capillary: 286 mg/dL — ABNORMAL HIGH (ref 65–99)

## 2016-02-29 LAB — PROCALCITONIN: PROCALCITONIN: 0.14 ng/mL

## 2016-02-29 LAB — LIPASE, BLOOD: Lipase: <10 U/L — ABNORMAL LOW (ref 11–51)

## 2016-02-29 LAB — GLUCOSE, CAPILLARY
GLUCOSE-CAPILLARY: 177 mg/dL — AB (ref 65–99)
GLUCOSE-CAPILLARY: 227 mg/dL — AB (ref 65–99)

## 2016-02-29 LAB — LACTIC ACID, PLASMA: Lactic Acid, Venous: 2.1 mmol/L (ref 0.5–1.9)

## 2016-02-29 LAB — URINALYSIS, ROUTINE W REFLEX MICROSCOPIC
BACTERIA UA: NONE SEEN
BILIRUBIN URINE: NEGATIVE
Bilirubin Urine: NEGATIVE
Glucose, UA: >=500 mg/dL — AB
Hgb urine dipstick: NEGATIVE
KETONES UR: 80 mg/dL — AB
Ketones, ur: 80 mg/dL — AB
NITRITE: NEGATIVE
Nitrite: NEGATIVE
PH: 6 (ref 5.0–8.0)
PROTEIN: 30 mg/dL — AB
Protein, ur: NEGATIVE mg/dL
SPECIFIC GRAVITY, URINE: 1.023 (ref 1.005–1.030)
SQUAMOUS EPITHELIAL / LPF: NONE SEEN
Specific Gravity, Urine: 1.015 (ref 1.005–1.030)
pH: 7 (ref 5.0–8.0)

## 2016-02-29 LAB — COMPREHENSIVE METABOLIC PANEL
ALBUMIN: 4.1 g/dL (ref 3.5–5.0)
ALK PHOS: 97 U/L (ref 38–126)
ALT: 13 U/L — ABNORMAL LOW (ref 14–54)
ANION GAP: 21 — AB (ref 5–15)
AST: 21 U/L (ref 15–41)
BILIRUBIN TOTAL: 2 mg/dL — AB (ref 0.3–1.2)
BUN: 12 mg/dL (ref 6–20)
CALCIUM: 9 mg/dL (ref 8.9–10.3)
CO2: 11 mmol/L — ABNORMAL LOW (ref 22–32)
Chloride: 101 mmol/L (ref 101–111)
Creatinine, Ser: 0.88 mg/dL (ref 0.44–1.00)
GFR calc Af Amer: >60 mL/min (ref >60)
GLUCOSE: 450 mg/dL — AB (ref 65–99)
Potassium: 4.1 mmol/L (ref 3.5–5.1)
Sodium: 133 mmol/L — ABNORMAL LOW (ref 135–145)
Total Protein: 7.4 g/dL (ref 6.5–8.1)

## 2016-02-29 LAB — TROPONIN I: Troponin I: 0.03 ng/mL (ref <0.03)

## 2016-02-29 LAB — PREGNANCY, URINE: Preg Test, Ur: NEGATIVE

## 2016-02-29 MED ORDER — METOCLOPRAMIDE HCL 5 MG/ML IJ SOLN
10.0000 mg | Freq: Once | INTRAMUSCULAR | Status: AC
  Administered 2016-02-29: 10 mg via INTRAVENOUS
  Filled 2016-02-29: qty 2

## 2016-02-29 MED ORDER — ALBUTEROL SULFATE (2.5 MG/3ML) 0.083% IN NEBU: 2.5000 mg | INHALATION_SOLUTION | Freq: Four times a day (QID) | RESPIRATORY_TRACT | Status: DC | PRN

## 2016-02-29 MED ORDER — SODIUM CHLORIDE 0.9 % IV SOLN
INTRAVENOUS | Status: DC
  Administered 2016-02-29: 2.3 [IU]/h via INTRAVENOUS
  Administered 2016-03-01: 2.6 [IU]/h via INTRAVENOUS
  Filled 2016-02-29 (×2): qty 2.5

## 2016-02-29 MED ORDER — METOCLOPRAMIDE HCL 5 MG/ML IJ SOLN
10.0000 mg | Freq: Four times a day (QID) | INTRAMUSCULAR | Status: AC
  Administered 2016-03-01 (×4): 10 mg via INTRAVENOUS
  Filled 2016-02-29 (×4): qty 2

## 2016-02-29 MED ORDER — SODIUM CHLORIDE 0.9 % IV SOLN
INTRAVENOUS | Status: DC
  Administered 2016-02-29: 4 [IU]/h via INTRAVENOUS
  Filled 2016-02-29: qty 2.5

## 2016-02-29 MED ORDER — FAMOTIDINE IN NACL 20-0.9 MG/50ML-% IV SOLN
20.0000 mg | Freq: Once | INTRAVENOUS | Status: AC
  Administered 2016-02-29: 20 mg via INTRAVENOUS
  Filled 2016-02-29: qty 50

## 2016-02-29 MED ORDER — PROMETHAZINE HCL 25 MG/ML IJ SOLN
12.5000 mg | Freq: Once | INTRAMUSCULAR | Status: AC
  Administered 2016-02-29: 12.5 mg via INTRAVENOUS
  Filled 2016-02-29: qty 1

## 2016-02-29 MED ORDER — PANTOPRAZOLE SODIUM 40 MG PO TBEC
40.0000 mg | DELAYED_RELEASE_TABLET | Freq: Two times a day (BID) | ORAL | Status: DC
  Administered 2016-03-01 – 2016-03-04 (×6): 40 mg via ORAL
  Filled 2016-02-29 (×7): qty 1

## 2016-02-29 MED ORDER — SODIUM CHLORIDE 0.9 % IV BOLUS (SEPSIS)
1000.0000 mL | Freq: Once | INTRAVENOUS | Status: AC
  Administered 2016-02-29: 1000 mL via INTRAVENOUS

## 2016-02-29 MED ORDER — METOCLOPRAMIDE HCL 10 MG PO TABS: 10.0000 mg | ORAL_TABLET | Freq: Three times a day (TID) | ORAL | 0 refills | Status: DC

## 2016-02-29 MED ORDER — MONTELUKAST SODIUM 10 MG PO TABS
10.0000 mg | ORAL_TABLET | Freq: Every day | ORAL | Status: DC
  Administered 2016-03-02 – 2016-03-04 (×3): 10 mg via ORAL
  Filled 2016-02-29 (×4): qty 1

## 2016-02-29 MED ORDER — FLUCONAZOLE 150 MG PO TABS
150.0000 mg | ORAL_TABLET | Freq: Once | ORAL | Status: AC
  Administered 2016-02-29: 150 mg via ORAL
  Filled 2016-02-29: qty 1

## 2016-02-29 MED ORDER — DEXTROSE-NACL 5-0.45 % IV SOLN
INTRAVENOUS | Status: DC
  Administered 2016-02-29: 22:00:00 via INTRAVENOUS
  Administered 2016-03-01 (×2): 1000 mL via INTRAVENOUS

## 2016-02-29 MED ORDER — FAMOTIDINE 20 MG PO TABS: 20.0000 mg | ORAL_TABLET | Freq: Every day | ORAL | 0 refills | Status: DC

## 2016-02-29 MED ORDER — DEXTROSE-NACL 5-0.45 % IV SOLN: INTRAVENOUS | Status: DC

## 2016-02-29 MED ORDER — SODIUM CHLORIDE 0.9 % IV SOLN: INTRAVENOUS | Status: DC

## 2016-02-29 MED ORDER — MOMETASONE FURO-FORMOTEROL FUM 200-5 MCG/ACT IN AERO
2.0000 | INHALATION_SPRAY | Freq: Two times a day (BID) | RESPIRATORY_TRACT | Status: DC
  Filled 2016-02-29: qty 8.8

## 2016-02-29 MED ORDER — ESCITALOPRAM OXALATE 10 MG PO TABS
5.0000 mg | ORAL_TABLET | Freq: Every day | ORAL | Status: DC
  Administered 2016-03-02 – 2016-03-04 (×3): 5 mg via ORAL
  Filled 2016-02-29 (×4): qty 1

## 2016-02-29 MED ORDER — SODIUM CHLORIDE 0.9 % IV BOLUS (SEPSIS)
1000.0000 mL | Freq: Once | INTRAVENOUS | Status: AC
Start: 2016-02-29 — End: 2016-02-29
  Administered 2016-02-29: 1000 mL via INTRAVENOUS

## 2016-02-29 MED ORDER — ONDANSETRON HCL 4 MG/2ML IJ SOLN
4.0000 mg | Freq: Once | INTRAMUSCULAR | Status: AC
  Administered 2016-02-29: 4 mg via INTRAVENOUS
  Filled 2016-02-29: qty 2

## 2016-02-29 MED ORDER — ENOXAPARIN SODIUM 40 MG/0.4ML ~~LOC~~ SOLN
40.0000 mg | Freq: Every day | SUBCUTANEOUS | Status: DC
  Administered 2016-02-29 – 2016-03-03 (×4): 40 mg via SUBCUTANEOUS
  Filled 2016-02-29 (×5): qty 0.4

## 2016-02-29 MED ORDER — PROMETHAZINE HCL 25 MG/ML IJ SOLN
12.5000 mg | Freq: Three times a day (TID) | INTRAMUSCULAR | Status: DC | PRN
  Administered 2016-02-29 – 2016-03-01 (×2): 12.5 mg via INTRAVENOUS
  Filled 2016-02-29 (×2): qty 1

## 2016-02-29 MED ORDER — SODIUM CHLORIDE 0.9 % IV SOLN
30.0000 meq | Freq: Once | INTRAVENOUS | Status: AC
  Administered 2016-02-29: 30 meq via INTRAVENOUS
  Filled 2016-02-29: qty 15

## 2016-02-29 MED ORDER — KETOROLAC TROMETHAMINE 15 MG/ML IJ SOLN
15.0000 mg | Freq: Four times a day (QID) | INTRAMUSCULAR | Status: DC | PRN
Start: 2016-02-29 — End: 2016-03-04
  Administered 2016-02-29 – 2016-03-03 (×6): 15 mg via INTRAVENOUS
  Filled 2016-02-29 (×7): qty 1

## 2016-02-29 NOTE — ED Notes (Signed)
Pt is drinking diet coke

## 2016-02-29 NOTE — ED Provider Notes (Signed)
[facility] DEPT Provider Note   CSN: [id number] Arrival date & time: 02/29/16  1534     History   Chief Complaint Chief Complaint  Patient presents with  . Abdominal Pain  . Nausea  . Emesis    HPI [name] is a 26 y.o. female.  Patient is a 7 Route female with a history of diabetes and gastroparesis who presents with vomiting. She's had frequent past admissions for gastroparesis and DKA. She's a poorly controlled diabetic. She was recently admitted this month and discharged on February 6. She was also seen here yesterday in the emergency department and did not have any evidence of DKA. She was tolerating fluids and was sent home. She states that she got home and continued having nausea and vomiting. She feels like she is in DKA at this point. Also noted on her last recent admission she had a repeat CT scan of her abdomen and pelvis which showed bladder distention with right hydronephrosis. Urology was consulted and felt that she had a neurogenic bladder and would have to do self caths at home. She denies any recent fevers. No URI symptoms. No change in bowel habits. She denies any hematemesis or blood in her stool.      Past Medical History:  Diagnosis Date  . Anxiety   . Arthritis   . Asthma   . Diabetes mellitus 2007   IDDM.  poorly controlled, multiple admits with DKA  . Gallstones   . Gastroparesis   . Heart murmur   . Hepatic steatosis 11/26/2014   and hepatomegaly  . Hypertension    NOT CURRENTLY ON ANY BP MED  . Liver mass 11/26/2014  . Pancreatitis, acute 11/26/2014    Patient Active Problem List   Diagnosis Date Noted  . UTI (urinary tract infection) 01/22/2016  . Depression   . Diarrhea 11/09/2015  . Acute urinary retention   . Gastroparesis due to DM ([place]) 07/10/2015  . GERD (gastroesophageal reflux disease) 07/10/2015  . Depression with anxiety 07/10/2015  . Uncontrolled type 1 diabetes mellitus with hyperglycemia ([place])   . Gastroparesis  06/22/2015  . Altered mental status 06/22/2015  . Volume depletion 06/10/2015  . Protein-calorie malnutrition, severe 06/10/2015  . Hyperglycemia   . Intractable vomiting with nausea   . Elevated bilirubin   . Type 1 diabetes mellitus with hyperglycemia ([place]) 05/29/2015  . Hematemesis with nausea   . Intractable vomiting 05/28/2015  . Intractable nausea and vomiting 05/28/2015  . Abdominal pain in female   . Abdominal pain 05/24/2015  . Hypertension 05/24/2015  . Dehydration   . Diabetic gastroparesis ([place])   . Chronic diastolic heart failure ([place]) 04/11/2015  . Hematemesis 04/08/2015  . DKA (diabetic ketoacidoses) ([place]) 03/22/2015  . Nausea vomiting and diarrhea 03/22/2015  . Nausea & vomiting   . Diabetic ketoacidosis without coma associated with type 1 diabetes mellitus ([place])   . S/P laparoscopic cholecystectomy 02/11/2015  . Status post laparoscopic cholecystectomy 02/11/2015  . Postextubation stridor   . Nausea and vomiting 11/27/2014  . Pancreatitis, acute 11/26/2014  . Volume overload 11/26/2014  . Hypokalemia 11/26/2014  . Hepatic steatosis 11/26/2014  . Liver mass 11/26/2014  . Sepsis ([place]) 11/25/2014  . Sinus tachycardia 11/25/2014  . Hypomagnesemia 11/25/2014  . Hypophosphatemia 11/25/2014  . Elevated amylase and lipase 11/25/2014  . DKA, type 1 ([place]) 11/24/2014  . Elevated LFTs 11/24/2014  . Acute kidney injury ([place]) 11/24/2014  . Migraine headache 11/24/2014  . Asthma 06/29/2012  . Uncontrolled type 1  diabetes mellitus ([place]) 06/19/2010  . Goiter, unspecified 06/19/2010    Past Surgical History:  Procedure Laterality Date  . CHOLECYSTECTOMY N/A 02/11/2015   Procedure: LAPAROSCOPIC CHOLECYSTECTOMY WITH INTRAOPERATIVE CHOLANGIOGRAM;  Surgeon: Greer Pickerel, MD;  Location: [facility] ORS;  Service: General;  Laterality: N/A;  . ESOPHAGOGASTRODUODENOSCOPY (EGD) WITH PROPOFOL Left 09/20/2014   Procedure: ESOPHAGOGASTRODUODENOSCOPY (EGD) WITH PROPOFOL;  Surgeon: Arta Silence, MD;  Location: [facility] ENDOSCOPY;  Service: Endoscopy;  Laterality: Left;  . WISDOM TOOTH EXTRACTION      OB History    Gravida Para Term Preterm AB Living   2 1 0 1 1 1    SAB TAB Ectopic Multiple Live Births   0 1 0 0 1       Home Medications    Prior to Admission medications   Medication Sig Start Date End Date Taking? Authorizing Provider  albuterol (PROVENTIL HFA;VENTOLIN HFA) 108 (90 BASE) MCG/ACT inhaler Inhale 2 puffs into the lungs every 6 (six) hours as needed for shortness of breath.    Historical Provider, MD  albuterol (PROVENTIL) (2.5 MG/3ML) 0.083% nebulizer solution Take 2.5 mg by nebulization every 6 (six) hours as needed for shortness of breath.    Historical Provider, MD  budesonide-formoterol (SYMBICORT) 160-4.5 MCG/ACT inhaler Inhale 2 puffs into the lungs daily.     Historical Provider, MD  escitalopram (LEXAPRO) 10 MG tablet Take 0.5 tablets (5 mg total) by mouth daily. 01/19/16   Barton Dubois, MD  famotidine (PEPCID) 20 MG tablet Take 1 tablet (20 mg total) by mouth at bedtime. 02/29/16   Antonietta Breach, PA-C  fluconazole (DIFLUCAN) 100 MG tablet Take 1 tablet (100 mg total) by mouth daily. 02/25/16   Belkys A Regalado, MD  Insulin Glargine (BASAGLAR KWIKPEN) 100 UNIT/ML SOPN Inject 0.25 mLs (25 Units total) into the skin at bedtime. 02/25/16   Belkys A Regalado, MD  magnesium oxide (MAG-OX) 400 MG tablet Take 1 tablet (400 mg total) by mouth daily. 02/25/16   Belkys A Regalado, MD  metoCLOPramide (REGLAN) 10 MG tablet Take 1 tablet (10 mg total) by mouth 3 (three) times daily before meals. 02/29/16   Antonietta Breach, PA-C  montelukast (SINGULAIR) 10 MG tablet Take 10 mg by mouth daily.    Historical Provider, MD  NOVOLOG FLEXPEN 100 UNIT/ML FlexPen 1 UNIT FOR EVERY 10 GRAMS CARB W/MEALS/SNACKS PLUS SLIDING SCALE: 1 UNIT GLUCOSE 50 ABOVE 150.TDD 20 12/26/15   Historical Provider, MD  pantoprazole (PROTONIX) 40 MG tablet Take 1 tablet (40 mg total) by mouth 2 (two) times daily.  02/25/16   Belkys A Regalado, MD  potassium chloride SA (K-DUR,KLOR-CON) 20 MEQ tablet Take 1 tablet (20 mEq total) by mouth daily. 02/25/16   Belkys A Regalado, MD  Vitamin D, Ergocalciferol, (DRISDOL) 50000 units CAPS capsule TAKE ONE CAPSULE (50,000 UNITS TOTAL) BY MOUTH ONCE A WEEK. Saturday 12/27/15   Historical Provider, MD    Family History Family History  Problem Relation Age of Onset  . Heart disease Maternal Grandmother   . Heart disease Maternal Grandfather   . Diabetes Mother   . Hyperlipidemia Mother   . Hypertension Father   . Heart disease Father   . Hypertension Paternal Grandmother   . Cancer Paternal Grandfather     Social History Social History  Substance Use Topics  . Smoking status: Never Smoker  . Smokeless tobacco: Never Used  . Alcohol use No     Allergies   Peanut-containing drug products; Strawberry extract; and Ultram [tramadol]   Review  of Systems Review of Systems  Constitutional: Negative for chills, diaphoresis, fatigue and fever.  HENT: Negative for congestion, rhinorrhea and sneezing.   Eyes: Negative.   Respiratory: Negative for cough, chest tightness and shortness of breath.   Cardiovascular: Negative for chest pain and leg swelling.  Gastrointestinal: Positive for abdominal pain, nausea and vomiting. Negative for blood in stool and diarrhea.  Genitourinary: Negative for difficulty urinating, flank pain, frequency and hematuria.  Musculoskeletal: Negative for arthralgias and back pain.  Skin: Negative for rash.  Neurological: Negative for dizziness, speech difficulty, weakness, numbness and headaches.     Physical Exam Updated Vital Signs BP 143/80   Pulse (!) 144   Temp 99.1 F (37.3 C) (Oral)   Resp (!) 29   LMP 02/19/2016   SpO2 100%   Physical Exam  Constitutional: She is oriented to person, place, and time. She appears well-developed and well-nourished.  HENT:  Head: Normocephalic and atraumatic.  Eyes: Pupils are equal,  round, and reactive to light.  Neck: Normal range of motion. Neck supple.  Cardiovascular: Regular rhythm and normal heart sounds.  Tachycardia present.   Pulmonary/Chest: Effort normal and breath sounds normal. No respiratory distress. She has no wheezes. She has no rales. She exhibits no tenderness.  Abdominal: Soft. Bowel sounds are normal. There is tenderness (mild epigastric pain). There is no rebound and no guarding.  Musculoskeletal: Normal range of motion. She exhibits no edema.  Lymphadenopathy:    She has no cervical adenopathy.  Neurological: She is alert and oriented to person, place, and time.  Skin: Skin is warm and dry. No rash noted.  Psychiatric: She has a normal mood and affect.     ED Treatments / Results  Labs (all labs ordered are listed, but only abnormal results are displayed) Labs Reviewed  COMPREHENSIVE METABOLIC PANEL - Abnormal; Notable for the following:       Result Value   Sodium 133 (*)    CO2 11 (*)    Glucose, Bld 450 (*)    ALT 13 (*)    Total Bilirubin 2.0 (*)    Anion gap 21 (*)    All other components within normal limits  CBC WITH DIFFERENTIAL/PLATELET - Abnormal; Notable for the following:    WBC 15.5 (*)    Platelets 427 (*)    Neutro Abs 12.2 (*)    All other components within normal limits  URINALYSIS, ROUTINE W REFLEX MICROSCOPIC - Abnormal; Notable for the following:    Color, Urine STRAW (*)    Glucose, UA >=500 (*)    Hgb urine dipstick SMALL (*)    Ketones, ur 80 (*)    Leukocytes, UA MODERATE (*)    Bacteria, UA RARE (*)    Squamous Epithelial / LPF 0-5 (*)    All other components within normal limits  LIPASE, BLOOD - Abnormal; Notable for the following:    Lipase <10 (*)    All other components within normal limits  CBG MONITORING, ED - Abnormal; Notable for the following:    Glucose-Capillary 390 (*)    All other components within normal limits  PREGNANCY, URINE  BLOOD GAS, VENOUS    EKG  EKG  Interpretation  Date/Time:  Saturday February 29 2016 15:55:39 EST Ventricular Rate:  128 PR Interval:    QRS Duration: 78 QT Interval:  284 QTC Calculation: 415 R Axis:   76 Text Interpretation:  Sinus tachycardia LAE, consider biatrial enlargement Borderline T abnormalities, inferior leads since last tracing  no significant change Confirmed by London Nonaka  MD, Dquan Cortopassi ([id number]) on 02/29/2016 4:13:47 PM       Radiology No results found.  Procedures Procedures (including critical care time)  Medications Ordered in ED Medications  dextrose 5 %-0.45 % sodium chloride infusion (not administered)  insulin regular (NOVOLIN R,HUMULIN R) 250 Units in sodium chloride 0.9 % 250 mL (1 Units/mL) infusion (not administered)  sodium chloride 0.9 % bolus 1,000 mL (1,000 mLs Intravenous New Bag/Given 02/29/16 1803)    And  0.9 %  sodium chloride infusion (not administered)  sodium chloride 0.9 % bolus 1,000 mL (0 mLs Intravenous Stopped 02/29/16 1802)  promethazine (PHENERGAN) injection 12.5 mg (12.5 mg Intravenous Given 02/29/16 1754)  famotidine (PEPCID) IVPB 20 mg premix (0 mg Intravenous Stopped 02/29/16 1726)     Initial Impression / Assessment and Plan / ED Course  I have reviewed the triage vital signs and the nursing notes.  Pertinent labs & imaging results that were available during my care of the patient were reviewed by me and considered in my medical decision making (see chart for details).     Patient presents with vomiting diarrhea in the setting of diabetes. It does appear that she's in DKA. She's tachycardic with a heart rate in the 140s. Her blood pressure is stable. She was given IV fluids and started on insulin drip. She has an anion gap of 21 and a bicarbonate of 11. Urinalysis is not consistent with the UTI. She did however have over 1 L of urine on bladder scan. She does say that she's been doing self caths at home every 6 hours and given earlier today with not much urine return. She  did have an in and out cath performed in the ED which produced over 1 L of urine.  She will need to be admitted for stabilization of her DKA. I will consult the hospitalist.  I spoke with Dr. Roel Cluck who will admit the pt.  CRITICAL CARE Performed by: Raeshaun Simson Total critical care time: 45 minutes Critical care time was exclusive of separately billable procedures and treating other patients. Critical care was necessary to treat or prevent imminent or life-threatening deterioration. Critical care was time spent personally by me on the following activities: development of treatment plan with patient and/or surrogate as well as nursing, discussions with consultants, evaluation of patient's response to treatment, examination of patient, obtaining history from patient or surrogate, ordering and performing treatments and interventions, ordering and review of laboratory studies, ordering and review of radiographic studies, pulse oximetry and re-evaluation of patient's condition.  Final Clinical Impressions(s) / ED Diagnoses   Final diagnoses:  Diabetic ketoacidosis without coma associated with diabetes mellitus due to underlying condition [facility])  Urinary retention    New Prescriptions New Prescriptions   No medications on file     Malvin Johns, MD 02/29/16 2304

## 2016-02-29 NOTE — H&P (Signed)
[name] UA:[id number] DOB: 09-Dec-1990 DOA: 02/29/2016     PCP: Vicenta Aly, FNP   Outpatient Specialists: Gillermina Hu ,  Patient coming from:   home Lives   With family    Chief Complaint: nausea and vomiting  HPI: [name] is a 26 y.o. female with medical history significant of DM type 1, recurrent DKA, gastroparesis  recent pancreatitis, liver adenomas, anxiety, hepatic steatosis, asthma possibly drug-seeking behavior    Presented with a day history of persistent nausea and vomiting she has been evaluated for this in emergency department yesterday she was not found to have any evidence of DKA at that time. She was rehydrated was able to be discharged home today she comes back again with persistent nausea and vomiting abdominal pain 10 out of 10. Denies any fever no chills. He shouldn't requesting "pain medications" stating she just wants to go to sleep Patient has had multiple recurrent admissions last time she was admitted on February 1 at that time she had coffee-ground emesis after having had a dental procedure and intractable nausea and vomiting. She was found to be in DKA She was discharged on a 6th of February. CT scan of abdomen at that time showed no acute abnormalities but marked distention of urinary bladder patient was instructed to do in and out catheterizations this was associated with right hydronephrosis urology was consulted in her Reglan was increased. UA urine grew disease and she was treated with ceftriaxone for 3 days and started, so for 5 days.     IN ER:  Temp (24hrs), Avg:98.7 F (37.1 C), Min:98.2 F (36.8 C), Max:99.1 F (37.3 C)    RR 29 oxygen 100% HR 145 BG 463 started on IV fluids and glucose stabilizer AG 21 bicarb11 She was again noted to have urinary retention the 1 L of urine in bladder  Following Medications were ordered in ER: Medications  dextrose 5 %-0.45 % sodium chloride infusion (not administered)  insulin regular  (NOVOLIN R,HUMULIN R) 250 Units in sodium chloride 0.9 % 250 mL (1 Units/mL) infusion (4 Units/hr Intravenous New Bag/Given 02/29/16 1815)  sodium chloride 0.9 % bolus 1,000 mL (1,000 mLs Intravenous New Bag/Given 02/29/16 1803)    And  0.9 %  sodium chloride infusion (not administered)  sodium chloride 0.9 % bolus 1,000 mL (0 mLs Intravenous Stopped 02/29/16 1802)  promethazine (PHENERGAN) injection 12.5 mg (12.5 mg Intravenous Given 02/29/16 1754)  famotidine (PEPCID) IVPB 20 mg premix (0 mg Intravenous Stopped 02/29/16 1726)      Hospitalist was called for admission forDKA in the setting of recurrent DKA admissions with medical noncompliance and gastroparesis  Review of Systems:    Pertinent positives include:  abdominal pain, ,nausea, vomiting,  Constitutional:  No weight loss, night sweats, Fevers, chills, fatigue, weight loss  HEENT:  No headaches, Difficulty swallowing,Tooth/dental problems,Sore throat,  No sneezing, itching, ear ache, nasal congestion, post nasal drip,  Cardio-vascular:  No chest pain, Orthopnea, PND, anasarca, dizziness, palpitations.no Bilateral lower extremity swelling  GI:  No heartburn, indigestion diarrhea, change in bowel habits, loss of appetite, melena, blood in stool, hematemesis Resp:  no shortness of breath at rest. No dyspnea on exertion, No excess mucus, no productive cough, No non-productive cough, No coughing up of blood.No change in color of mucus.No wheezing. Skin:  no rash or lesions. No jaundice GU:  no dysuria, change in color of urine, no urgency or frequency. No straining to urinate.  No flank pain.  Musculoskeletal:  No joint pain  or no joint swelling. No decreased range of motion. No back pain.  Psych:  No change in mood or affect. No depression or anxiety. No memory loss.  Neuro: no localizing neurological complaints, no tingling, no weakness, no double vision, no gait abnormality, no slurred speech, no confusion  As per HPI  otherwise 10 point review of systems negative.   Past Medical History: Past Medical History:  Diagnosis Date  . Anxiety   . Arthritis   . Asthma   . Diabetes mellitus 2007   IDDM.  poorly controlled, multiple admits with DKA  . Gallstones   . Gastroparesis   . Heart murmur   . Hepatic steatosis 11/26/2014   and hepatomegaly  . Hypertension    NOT CURRENTLY ON ANY BP MED  . Liver mass 11/26/2014  . Pancreatitis, acute 11/26/2014   Past Surgical History:  Procedure Laterality Date  . CHOLECYSTECTOMY N/A 02/11/2015   Procedure: LAPAROSCOPIC CHOLECYSTECTOMY WITH INTRAOPERATIVE CHOLANGIOGRAM;  Surgeon: Greer Pickerel, MD;  Location: [facility];  Service: General;  Laterality: N/A;  . ESOPHAGOGASTRODUODENOSCOPY (EGD) WITH PROPOFOL Left 09/20/2014   Procedure: ESOPHAGOGASTRODUODENOSCOPY (EGD) WITH PROPOFOL;  Surgeon: Arta Silence, MD;  Location: [facility];  Service: Endoscopy;  Laterality: Left;  . WISDOM TOOTH EXTRACTION       Social History:  Ambulatory   independently      reports that she has never smoked. She has never used smokeless tobacco. She reports that she uses drugs, including Marijuana. She reports that she does not drink alcohol.  Allergies:   Allergies  Allergen Reactions  . Peanut-Containing Drug Products Swelling    Swelling of mouth, lips  . Strawberry Extract Swelling    Swelling of mouth, lips  . Ultram [Tramadol] Itching       Family History:   Family History  Problem Relation Age of Onset  . Heart disease Maternal Grandmother   . Heart disease Maternal Grandfather   . Diabetes Mother   . Hyperlipidemia Mother   . Hypertension Father   . Heart disease Father   . Hypertension Paternal Grandmother   . Cancer Paternal Grandfather     Medications: Prior to Admission medications   Medication Sig Start Date End Date Taking? Authorizing Provider  albuterol (PROVENTIL HFA;VENTOLIN HFA) 108 (90 BASE) MCG/ACT inhaler Inhale 2 puffs into the lungs every  6 (six) hours as needed for shortness of breath.   Yes Historical Provider, MD  albuterol (PROVENTIL) (2.5 MG/3ML) 0.083% nebulizer solution Take 2.5 mg by nebulization every 6 (six) hours as needed for shortness of breath.   Yes Historical Provider, MD  budesonide-formoterol (SYMBICORT) 160-4.5 MCG/ACT inhaler Inhale 2 puffs into the lungs daily.    Yes Historical Provider, MD  escitalopram (LEXAPRO) 10 MG tablet Take 0.5 tablets (5 mg total) by mouth daily. 01/19/16  Yes Barton Dubois, MD  famotidine (PEPCID) 20 MG tablet Take 1 tablet (20 mg total) by mouth at bedtime. 02/29/16  Yes Antonietta Breach, PA-C  Insulin Glargine (BASAGLAR KWIKPEN) 100 UNIT/ML SOPN Inject 0.25 mLs (25 Units total) into the skin at bedtime. 02/25/16  Yes Belkys A Regalado, MD  montelukast (SINGULAIR) 10 MG tablet Take 10 mg by mouth daily.   Yes Historical Provider, MD  NOVOLOG FLEXPEN 100 UNIT/ML FlexPen 1 UNIT FOR EVERY 10 GRAMS CARB W/MEALS/SNACKS PLUS SLIDING SCALE: 1 UNIT GLUCOSE 50 ABOVE 150.TDD 20 12/26/15  Yes Historical Provider, MD  pantoprazole (PROTONIX) 40 MG tablet Take 1 tablet (40 mg total) by mouth 2 (two) times daily.  02/25/16  Yes Belkys A Regalado, MD  Vitamin D, Ergocalciferol, (DRISDOL) 50000 units CAPS capsule TAKE ONE CAPSULE (50,000 UNITS TOTAL) BY MOUTH ONCE A WEEK. Saturday 12/27/15  Yes Historical Provider, MD  fluconazole (DIFLUCAN) 100 MG tablet Take 1 tablet (100 mg total) by mouth daily. Patient not taking: Reported on 02/29/2016 02/25/16   Belkys A Regalado, MD  magnesium oxide (MAG-OX) 400 MG tablet Take 1 tablet (400 mg total) by mouth daily. 02/25/16   Belkys A Regalado, MD  metoCLOPramide (REGLAN) 10 MG tablet Take 1 tablet (10 mg total) by mouth 3 (three) times daily before meals. 02/29/16   Antonietta Breach, PA-C  potassium chloride SA (K-DUR,KLOR-CON) 20 MEQ tablet Take 1 tablet (20 mEq total) by mouth daily. Patient not taking: Reported on 02/29/2016 02/25/16   Elmarie Shiley, MD    Physical  Exam: Patient Vitals for the past 24 hrs:  BP Temp Temp src Pulse Resp SpO2  02/29/16 1801 143/80 - - (!) 144 (!) 29 100 %  02/29/16 1800 143/80 - - (!) 144 (!) 28 100 %  02/29/16 1557 (!) 152/101 99.1 F (37.3 C) Oral (!) 138 18 100 %  02/29/16 1538 136/88 98.8 F (37.1 C) Oral (!) 135 16 100 %    1. General:  in No Acute distress 2. Psychological: Alert and  Oriented 3. Head/ENT:    Dry Mucous Membranes                          Head Non traumatic, neck supple                           normal Dentition 4. SKIN:   decreased Skin turgor,  Skin clean Dry and intact no rash 5. Heart: Regular rate and rhythm no    Murmur, Rub or gallop 6. Lungs:  no wheezes or crackles   7. Abdomen: Soft, non-tender, Non distended 8. Lower extremities: no clubbing, cyanosis, or edema 9. Neurologically Grossly intact, moving all 4 extremities equally   10. MSK: Normal range of motion   body mass index is unknown because there is no height or weight on file.  Labs on Admission:   Labs on Admission: I have personally reviewed following labs and imaging studies  CBC:  Recent Labs Lab 02/23/16 0339 02/24/16 0342 02/28/16 2226 02/29/16 1645  WBC 8.4 9.2 12.6* 15.5*  NEUTROABS  --   --   --  12.2*  HGB 11.2* 11.2* 13.0 12.7  HCT 31.8* 31.9* 38.0 38.1  MCV 85.7 85.8 88.0 89.9  PLT 353 364 580* [id number]*   Basic Metabolic Panel:  Recent Labs Lab 02/23/16 0339 02/24/16 0342 02/25/16 0817 02/28/16 2226 02/29/16 1645  NA 137 134* 139 140 133*  K 4.2 3.8 2.9* 3.3* 4.1  CL 105 105 98* 99* 101  CO2 20* 17* 33* 23 11*  GLUCOSE 291* 237* 194* 127* 450*  BUN <5* 5* 9 19 12   CREATININE 0.50 0.62 0.50 0.76 0.88  CALCIUM 8.1* 8.4* 8.2* 9.9 9.0   GFR: Estimated Creatinine Clearance: 83.7 mL/min (by C-G formula based on SCr of 0.88 mg/dL). Liver Function Tests:  Recent Labs Lab 02/23/16 1129 02/28/16 2226 02/29/16 1645  AST 17 33 21  ALT 10* 13* 13*  ALKPHOS 90 105 97  BILITOT 0.7 0.6 2.0*   PROT 5.8* 8.1 7.4  ALBUMIN 2.9* 4.3 4.1    Recent Labs Lab 02/28/16 2226  02/29/16 1645  LIPASE 12 <10*   No results for input(s): AMMONIA in the last 168 hours. Coagulation Profile: No results for input(s): INR, PROTIME in the last 168 hours. Cardiac Enzymes: No results for input(s): CKTOTAL, CKMB, CKMBINDEX, TROPONINI in the last 168 hours. BNP (last 3 results) No results for input(s): PROBNP in the last 8760 hours. HbA1C: No results for input(s): HGBA1C in the last 72 hours. CBG:  Recent Labs Lab 02/25/16 1139 02/28/16 2220 02/28/16 2342 02/29/16 1546 02/29/16 1813  GLUCAP 192* 135* 114* 390* 463*   Lipid Profile: No results for input(s): CHOL, HDL, LDLCALC, TRIG, CHOLHDL, LDLDIRECT in the last 72 hours. Thyroid Function Tests: No results for input(s): TSH, T4TOTAL, FREET4, T3FREE, THYROIDAB in the last 72 hours. Anemia Panel: No results for input(s): VITAMINB12, FOLATE, FERRITIN, TIBC, IRON, RETICCTPCT in the last 72 hours. Urine analysis:    Component Value Date/Time   COLORURINE STRAW (A) 02/29/2016 1645   APPEARANCEUR CLEAR 02/29/2016 1645   LABSPEC 1.015 02/29/2016 1645   PHURINE 6.0 02/29/2016 1645   GLUCOSEU >=500 (A) 02/29/2016 1645   GLUCOSEU >=1000 11/07/2012 1205   HGBUR SMALL (A) 02/29/2016 1645   BILIRUBINUR NEGATIVE 02/29/2016 1645   KETONESUR 80 (A) 02/29/2016 1645   PROTEINUR NEGATIVE 02/29/2016 1645   UROBILINOGEN 0.2 11/24/2014 1045   NITRITE NEGATIVE 02/29/2016 1645   LEUKOCYTESUR MODERATE (A) 02/29/2016 1645   Sepsis Labs: @LABRCNTIP (procalcitonin:4,lacticidven:4) ) Recent Results (from the past 240 hour(s))  MRSA PCR Screening     Status: None   Collection Time: 02/20/16  2:40 PM  Result Value Ref Range Status   MRSA by PCR NEGATIVE NEGATIVE Final    Comment:        The GeneXpert MRSA Assay (FDA approved for NASAL specimens only), is one component of a comprehensive MRSA colonization surveillance program. It is not intended  to diagnose MRSA infection nor to guide or monitor treatment for MRSA infections.   Culture, Urine     Status: Abnormal   Collection Time: 02/20/16  3:45 PM  Result Value Ref Range Status   Specimen Description URINE, CLEAN CATCH  Final   Special Requests NONE  Final   Culture >=100,000 COLONIES/mL YEAST (A)  Final   Report Status 02/22/2016 FINAL  Final         UA   evidence of UTI Urine culture sent possible colonization  Lab Results  Component Value Date   HGBA1C 9.6 (H) 02/20/2016    Estimated Creatinine Clearance: 83.7 mL/min (by C-G formula based on SCr of 0.88 mg/dL).  BNP (last 3 results) No results for input(s): PROBNP in the last 8760 hours.   ECG REPORT  Independently reviewed Rate: 128  Rhythm: Sinus tachycardia ST&T Change: No acute ischemic changes  [id number]  There were no vitals filed for this visit.   Cultures:    Component Value Date/Time   SDES URINE, CLEAN CATCH 02/20/2016 1545   SPECREQUEST NONE 02/20/2016 1545   CULT >=100,000 COLONIES/mL YEAST (A) 02/20/2016 1545   REPTSTATUS 02/22/2016 FINAL 02/20/2016 1545     Radiological Exams on Admission: No results found.  Chart has been reviewed    Assessment/Plan  26 y.o. female with medical history significant of DM type 1, recurrent DKA, gastroparesis  recent pancreatitis, liver adenomas, anxiety, hepatic steatosis, asthma   admitted for recurrent DKA urinary retention and gastroparesis resulting in intractable nausea and vomiting  Present on Admission: . DKA, type 1, not at goal [facility]) - will admit per DKA protocol, obtain  serial BMET, start on glucosestabalizer, aggressive IVF. Change IVF to D5 1/2Na after BG <250 . Will work up cause of DKA with CXR, ECG one set of cardiac enzymes, UA. Monitor in [place]. Replace potassium as needed.     . Asthma stable continue home medication . Chronic diastolic heart failure (HCC) iron appears to be dehydrated we'll rehydrate avoid fluid overload .  Dehydration rehydrate . Gastroparesis due to DM ([place]) treated with Reglan avoid IV narcotics Urinary retention - given aggressive Fluid Resuscitation will Place, Foley need to follow-up with urology Leukocytosis evaluate for sources of infection patient recently finished fluconazole will await results of urine cultures so far afebrile Meeting sepsis criteria with leukocytosis and tachycardia most likely secondary to dehydration no monitor for any sign of underlying infection Other plan as per orders.  DVT prophylaxis:  SCD   Code Status:  FULL CODE as per patient    Family Communication:   Family  not at  Bedside   Disposition Plan:      To home once workup is complete and patient is stable                                                 Consults called: Diabetes coordinator   Admission status  obs   Level of care         SDU      I have spent a total of 56 min on this admission   Sidney Kann 02/29/2016, 9:55 PM     [facility]  Pager [phone]   after 2 AM please page floor coverage PA If 7AM-7PM, please contact the day team taking care of the patient  [email or web address]  Password [email or web address]

## 2016-02-29 NOTE — Progress Notes (Signed)
CRITICAL VALUE ALERT  Critical value received:  Troponin 0.03, p02 (venous) 31.4  Date of notification:  02/29/2016  Time of notification:  2210, 2221  Critical value read back:Yes.    Nurse who received alert:  Odis Hollingshead RN   MD notified (1st page):  Dr. Almyra Free  Time of first page:  2225  Responding MD:  Dr. Almyra Free  Time MD responded:  2235

## 2016-02-29 NOTE — ED Notes (Signed)
Bed: WA14 Expected date:  Expected time:  Means of arrival:  Comments: Hold for Resus B 

## 2016-02-29 NOTE — Progress Notes (Signed)
CRITICAL VALUE ALERT  Critical value received:  LA 2.1  Date of notification:  02/29/2016  Time of notification:  2248  Critical value read back:Yes.    Nurse who received alert:  Lorretta Harp RN   MD notified (1st page):  Dr. Almyra Free  Time of first page:  2315  Responding MD:  Dr. Almyra Free  Time MD responded:  2320

## 2016-02-29 NOTE — ED Notes (Signed)
Patient in and out cath for 1100 ml of urine.

## 2016-02-29 NOTE — ED Triage Notes (Signed)
Patient came in with complain of nausea and vomiting. Patient states she has gastroparesis. Patient also complain of abdominal  pain 10/10.

## 2016-03-01 DIAGNOSIS — F419 Anxiety disorder, unspecified: Secondary | ICD-10-CM | POA: Diagnosis present

## 2016-03-01 DIAGNOSIS — K3184 Gastroparesis: Secondary | ICD-10-CM | POA: Diagnosis present

## 2016-03-01 DIAGNOSIS — R6510 Systemic inflammatory response syndrome (SIRS) of non-infectious origin without acute organ dysfunction: Secondary | ICD-10-CM | POA: Diagnosis present

## 2016-03-01 DIAGNOSIS — Z8249 Family history of ischemic heart disease and other diseases of the circulatory system: Secondary | ICD-10-CM | POA: Diagnosis not present

## 2016-03-01 DIAGNOSIS — I5032 Chronic diastolic (congestive) heart failure: Secondary | ICD-10-CM | POA: Diagnosis present

## 2016-03-01 DIAGNOSIS — J45909 Unspecified asthma, uncomplicated: Secondary | ICD-10-CM | POA: Diagnosis present

## 2016-03-01 DIAGNOSIS — N1330 Unspecified hydronephrosis: Secondary | ICD-10-CM | POA: Diagnosis present

## 2016-03-01 DIAGNOSIS — I110 Hypertensive heart disease with heart failure: Secondary | ICD-10-CM | POA: Diagnosis present

## 2016-03-01 DIAGNOSIS — E1043 Type 1 diabetes mellitus with diabetic autonomic (poly)neuropathy: Secondary | ICD-10-CM | POA: Diagnosis present

## 2016-03-01 DIAGNOSIS — K760 Fatty (change of) liver, not elsewhere classified: Secondary | ICD-10-CM | POA: Diagnosis present

## 2016-03-01 DIAGNOSIS — E860 Dehydration: Secondary | ICD-10-CM | POA: Diagnosis present

## 2016-03-01 DIAGNOSIS — Z833 Family history of diabetes mellitus: Secondary | ICD-10-CM | POA: Diagnosis not present

## 2016-03-01 DIAGNOSIS — E876 Hypokalemia: Secondary | ICD-10-CM | POA: Diagnosis present

## 2016-03-01 DIAGNOSIS — Z9114 Patient's other noncompliance with medication regimen: Secondary | ICD-10-CM | POA: Diagnosis not present

## 2016-03-01 DIAGNOSIS — E1010 Type 1 diabetes mellitus with ketoacidosis without coma: Secondary | ICD-10-CM | POA: Diagnosis present

## 2016-03-01 DIAGNOSIS — R112 Nausea with vomiting, unspecified: Secondary | ICD-10-CM | POA: Diagnosis present

## 2016-03-01 DIAGNOSIS — Z794 Long term (current) use of insulin: Secondary | ICD-10-CM | POA: Diagnosis not present

## 2016-03-01 DIAGNOSIS — Z9049 Acquired absence of other specified parts of digestive tract: Secondary | ICD-10-CM | POA: Diagnosis not present

## 2016-03-01 LAB — BASIC METABOLIC PANEL
ANION GAP: 9 (ref 5–15)
Anion gap: 11 (ref 5–15)
Anion gap: 16 — ABNORMAL HIGH (ref 5–15)
Anion gap: 13 (ref 5–15)
Anion gap: 11 (ref 5–15)
BUN: 9 mg/dL (ref 6–20)
BUN: 8 mg/dL (ref 6–20)
BUN: 8 mg/dL (ref 6–20)
BUN: 7 mg/dL (ref 6–20)
BUN: 7 mg/dL (ref 6–20)
CALCIUM: 8.6 mg/dL — AB (ref 8.9–10.3)
CALCIUM: 9 mg/dL (ref 8.9–10.3)
CALCIUM: 9 mg/dL (ref 8.9–10.3)
CHLORIDE: 109 mmol/L (ref 101–111)
CO2: 12 mmol/L — AB (ref 22–32)
CO2: 9 mmol/L — AB (ref 22–32)
CO2: 12 mmol/L — AB (ref 22–32)
CO2: 16 mmol/L — ABNORMAL LOW (ref 22–32)
CO2: 20 mmol/L — ABNORMAL LOW (ref 22–32)
CREATININE: 0.73 mg/dL (ref 0.44–1.00)
CREATININE: 0.71 mg/dL (ref 0.44–1.00)
CREATININE: 0.65 mg/dL (ref 0.44–1.00)
CREATININE: 0.48 mg/dL (ref 0.44–1.00)
Calcium: 9.1 mg/dL (ref 8.9–10.3)
Calcium: 9.3 mg/dL (ref 8.9–10.3)
Chloride: 116 mmol/L — ABNORMAL HIGH (ref 101–111)
Chloride: 113 mmol/L — ABNORMAL HIGH (ref 101–111)
Chloride: 113 mmol/L — ABNORMAL HIGH (ref 101–111)
Chloride: 112 mmol/L — ABNORMAL HIGH (ref 101–111)
Creatinine, Ser: 0.59 mg/dL (ref 0.44–1.00)
GFR calc Af Amer: >60 mL/min (ref >60)
GFR calc Af Amer: >60 mL/min (ref >60)
GFR calc Af Amer: >60 mL/min (ref >60)
GFR calc Af Amer: >60 mL/min (ref >60)
GFR calc Af Amer: >60 mL/min (ref >60)
GFR calc non Af Amer: >60 mL/min (ref >60)
GFR calc non Af Amer: >60 mL/min (ref >60)
GLUCOSE: 137 mg/dL — AB (ref 65–99)
GLUCOSE: 131 mg/dL — AB (ref 65–99)
GLUCOSE: 136 mg/dL — AB (ref 65–99)
Glucose, Bld: 175 mg/dL — ABNORMAL HIGH (ref 65–99)
Glucose, Bld: 138 mg/dL — ABNORMAL HIGH (ref 65–99)
POTASSIUM: 4 mmol/L (ref 3.5–5.1)
Potassium: 4.6 mmol/L (ref 3.5–5.1)
Potassium: 5 mmol/L (ref 3.5–5.1)
Potassium: 4 mmol/L (ref 3.5–5.1)
Potassium: 3.7 mmol/L (ref 3.5–5.1)
SODIUM: 139 mmol/L (ref 135–145)
Sodium: 139 mmol/L (ref 135–145)
Sodium: 138 mmol/L (ref 135–145)
Sodium: 138 mmol/L (ref 135–145)
Sodium: 138 mmol/L (ref 135–145)

## 2016-03-01 LAB — GLUCOSE, CAPILLARY
GLUCOSE-CAPILLARY: 138 mg/dL — AB (ref 65–99)
GLUCOSE-CAPILLARY: 134 mg/dL — AB (ref 65–99)
GLUCOSE-CAPILLARY: 158 mg/dL — AB (ref 65–99)
GLUCOSE-CAPILLARY: 143 mg/dL — AB (ref 65–99)
GLUCOSE-CAPILLARY: 141 mg/dL — AB (ref 65–99)
GLUCOSE-CAPILLARY: 129 mg/dL — AB (ref 65–99)
GLUCOSE-CAPILLARY: 195 mg/dL — AB (ref 65–99)
GLUCOSE-CAPILLARY: 141 mg/dL — AB (ref 65–99)
GLUCOSE-CAPILLARY: 149 mg/dL — AB (ref 65–99)
GLUCOSE-CAPILLARY: 160 mg/dL — AB (ref 65–99)
Glucose-Capillary: 114 mg/dL — ABNORMAL HIGH (ref 65–99)
Glucose-Capillary: 117 mg/dL — ABNORMAL HIGH (ref 65–99)
Glucose-Capillary: 126 mg/dL — ABNORMAL HIGH (ref 65–99)
Glucose-Capillary: 142 mg/dL — ABNORMAL HIGH (ref 65–99)
Glucose-Capillary: 145 mg/dL — ABNORMAL HIGH (ref 65–99)
Glucose-Capillary: 152 mg/dL — ABNORMAL HIGH (ref 65–99)
Glucose-Capillary: 155 mg/dL — ABNORMAL HIGH (ref 65–99)
Glucose-Capillary: 164 mg/dL — ABNORMAL HIGH (ref 65–99)
Glucose-Capillary: 171 mg/dL — ABNORMAL HIGH (ref 65–99)
Glucose-Capillary: 186 mg/dL — ABNORMAL HIGH (ref 65–99)
Glucose-Capillary: 186 mg/dL — ABNORMAL HIGH (ref 65–99)
Glucose-Capillary: 187 mg/dL — ABNORMAL HIGH (ref 65–99)
Glucose-Capillary: 218 mg/dL — ABNORMAL HIGH (ref 65–99)
Glucose-Capillary: 233 mg/dL — ABNORMAL HIGH (ref 65–99)

## 2016-03-01 LAB — TROPONIN I
Troponin I: <0.03 ng/mL (ref <0.03)
Troponin I: <0.03 ng/mL (ref <0.03)

## 2016-03-01 LAB — MRSA PCR SCREENING: MRSA by PCR: NEGATIVE

## 2016-03-01 LAB — BETA-HYDROXYBUTYRIC ACID: BETA-HYDROXYBUTYRIC ACID: 3.26 mmol/L — AB (ref 0.05–0.27)

## 2016-03-01 LAB — PHOSPHORUS: PHOSPHORUS: 1.7 mg/dL — AB (ref 2.5–4.6)

## 2016-03-01 LAB — MAGNESIUM: Magnesium: 1.8 mg/dL (ref 1.7–2.4)

## 2016-03-01 MED ORDER — DICYCLOMINE HCL 10 MG/ML IM SOLN
20.0000 mg | Freq: Four times a day (QID) | INTRAMUSCULAR | Status: DC | PRN
  Administered 2016-03-01 – 2016-03-03 (×4): 20 mg via INTRAMUSCULAR
  Filled 2016-03-01 (×5): qty 2

## 2016-03-01 MED ORDER — OXYCODONE HCL 5 MG PO TABS
5.0000 mg | ORAL_TABLET | Freq: Three times a day (TID) | ORAL | Status: DC | PRN
  Administered 2016-03-02 (×2): 5 mg via ORAL
  Filled 2016-03-01 (×3): qty 1

## 2016-03-01 MED ORDER — FAMOTIDINE 20 MG PO TABS
20.0000 mg | ORAL_TABLET | Freq: Every day | ORAL | Status: DC
  Administered 2016-03-01 – 2016-03-03 (×3): 20 mg via ORAL
  Filled 2016-03-01 (×3): qty 1

## 2016-03-01 MED ORDER — ONDANSETRON HCL 4 MG/2ML IJ SOLN
4.0000 mg | Freq: Once | INTRAMUSCULAR | Status: AC
  Administered 2016-03-01: 4 mg via INTRAVENOUS
  Filled 2016-03-01: qty 2

## 2016-03-01 MED ORDER — PROCHLORPERAZINE EDISYLATE 5 MG/ML IJ SOLN
10.0000 mg | Freq: Four times a day (QID) | INTRAMUSCULAR | Status: DC | PRN
  Administered 2016-03-01 – 2016-03-03 (×5): 10 mg via INTRAVENOUS
  Filled 2016-03-01 (×5): qty 2

## 2016-03-01 MED ORDER — PROMETHAZINE HCL 25 MG/ML IJ SOLN
12.5000 mg | Freq: Four times a day (QID) | INTRAMUSCULAR | Status: DC | PRN
  Administered 2016-03-01 – 2016-03-03 (×5): 12.5 mg via INTRAVENOUS
  Filled 2016-03-01 (×5): qty 1

## 2016-03-01 NOTE — Progress Notes (Signed)
[facility] PROGRESS NOTE  [name] RKY:[id number] DOB: September 17, 1990 DOA: 02/29/2016 PCP: Vicenta Aly, FNP  Interim summary and HPI 26 y.o. female with medical history significant of DM type 1, recurrent DKA, gastroparesis, liver adenomas, anxiety, hepatic steatosis and asthma. Admitted for recurrent DKA, urinary retention and gastroparesis resulting in intractable nausea and vomiting.  Assessment/Plan: 1-DKA type 1: -continue IVF's, IV insulin drip, NPO status (except for ice chips and sips of water with medications) -follow BMET and CBG closely -patient DKA appears to be associated with medication non-compliance. No sources of infection appreciated. Troponin neg -continue PRN antiemetics  -continue supportive care  2-gastroparesis: -minimize narcotics -will continue reglan -will add bentyl PRN -continue PPI -patient educated about sugar control  3-asthma: -stable and well controlled -continue home inhalers and Singulair  4-hx of chronic diastolic heart failure: -well compensated -will continue follow daily weights and I's and O's  5-urinary retention  -will continue foley -outpatient follow up with urology  6-SIRS: patient met SIRS criteria on admission -most likely from DKA precess  -no sepsis currently -will monitor -holding on abx's for now   Code Status: Full Family Communication: no family at bedside  Disposition Plan: remains in stepdown bed; still with ongoing DKA (anion gap 16, CO2 9).   Consultants:  None   Procedures:  See below for x-ray reports   Antibiotics:  None   HPI/Subjective: Afebrile, no CP, no SOB. Reports not feeling well, nauseated with intermittent episodes of vomiting and complaining of abd pain.  Objective: Vitals:   03/01/16 0600 03/01/16 0800  BP: (!) 162/100   Pulse: (!) 132   Resp: 14   Temp:  99 F (37.2 C)    Intake/Output Summary (Last 24 hours) at 03/01/16 0900 Last data filed at 03/01/16  0600  Gross per 24 hour  Intake          3052.57 ml  Output             2750 ml  Net           302.57 ml   Filed Weights   02/29/16 2203  Weight: 50 kg (110 lb 3.7 oz)    Exam:   General: afebrile, complaining of nausea and actively vomiting intermittently. Reports ongoing pain in her abdomen and not feeling well.    Cardiovascular: tachycardic, no rubs, no gallops, no murmurs  Respiratory: CTA bilaterally   Abdomen: soft, no guarding, tender to palpation on her epigastric/mid abdomen (slightly out proportion base on physical exam and pain description)  Musculoskeletal: no edema, no cyanosis or clubbing   Data Reviewed: Basic Metabolic Panel:  Recent Labs Lab 02/28/16 2226 02/29/16 1645 02/29/16 2131 03/01/16 0124 03/01/16 0605  NA 140 133* 138 139 138  K 3.3* 4.1 4.4 4.6 5.0  CL 99* 101 113* 116* 113*  CO2 23 11* 11* 12* 9*  GLUCOSE 127* 450* 174* 137* 131*  BUN _0 CREATININE 0.76 0.88 0.89 0.73 0.71  CALCIUM 9.9 9.0 8.6* 8.6* 9.0  MG  --   --   --  1.8  --   PHOS  --   --   --  1.7*  --    Liver Function Tests:  Recent Labs Lab 02/23/16 1129 02/28/16 2226 02/29/16 1645  AST 17 33 21  ALT 10* 13* 13*  ALKPHOS 90 105 97  BILITOT 0.7 0.6 2.0*  PROT 5.8* 8.1 7.4  ALBUMIN 2.9* 4.3 4.1    Recent Labs Lab 02/28/16 2226  02/29/16 1645  LIPASE 12 <10*   CBC:  Recent Labs Lab 02/24/16 0342 02/28/16 2226 02/29/16 1645  WBC 9.2 12.6* 15.5*  NEUTROABS  --   --  12.2*  HGB 11.2* 13.0 12.7  HCT 31.9* 38.0 38.1  MCV 85.8 88.0 89.9  PLT 364 580* 427*   Cardiac Enzymes:  Recent Labs Lab 02/29/16 2131 03/01/16 0124 03/01/16 0605  TROPONINI 0.03* <0.03 <0.03   BNP (last 3 results)  Recent Labs  04/11/15 0325 07/11/15 0353  BNP 43.5 6.9   CBG:  Recent Labs Lab 03/01/16 0400 03/01/16 0506 03/01/16 0604 03/01/16 0656 03/01/16 0800  GLUCAP 233* 138* 134* 158* 143*    Recent Results (from the past 240 hour(s))  MRSA PCR  Screening     Status: None   Collection Time: 02/20/16  2:40 PM  Result Value Ref Range Status   MRSA by PCR NEGATIVE NEGATIVE Final    Comment:        The GeneXpert MRSA Assay (FDA approved for NASAL specimens only), is one component of a comprehensive MRSA colonization surveillance program. It is not intended to diagnose MRSA infection nor to guide or monitor treatment for MRSA infections.   Culture, Urine     Status: Abnormal   Collection Time: 02/20/16  3:45 PM  Result Value Ref Range Status   Specimen Description URINE, CLEAN CATCH  Final   Special Requests NONE  Final   Culture >=100,000 COLONIES/mL YEAST (A)  Final   Report Status 02/22/2016 FINAL  Final  MRSA PCR Screening     Status: None   Collection Time: 03/01/16  3:16 AM  Result Value Ref Range Status   MRSA by PCR NEGATIVE NEGATIVE Final    Comment:        The GeneXpert MRSA Assay (FDA approved for NASAL specimens only), is one component of a comprehensive MRSA colonization surveillance program. It is not intended to diagnose MRSA infection nor to guide or monitor treatment for MRSA infections.      Studies: Dg Chest Port 1 View  Result Date: 02/29/2016 CLINICAL DATA:  Leukocytosis nausea and vomiting EXAM: PORTABLE CHEST 1 VIEW COMPARISON:  02/20/2016 FINDINGS: The heart size and mediastinal contours are within normal limits. Both lungs are clear. The visualized skeletal structures are unremarkable. IMPRESSION: No active disease. Electronically Signed   By: Donavan Foil M.D.   On: 02/29/2016 22:33    Scheduled Meds: . enoxaparin (LOVENOX) injection  40 mg Subcutaneous QHS  . escitalopram  5 mg Oral Daily  . famotidine  20 mg Oral QHS  . metoCLOPramide (REGLAN) injection  10 mg Intravenous Q6H  . mometasone-formoterol  2 puff Inhalation BID  . montelukast  10 mg Oral Daily  . pantoprazole  40 mg Oral BID   Continuous Infusions: . sodium chloride    . dextrose 5 % and 0.45% NaCl 1,000 mL (03/01/16  0816)  . insulin (NOVOLIN-R) infusion 1.7 Units/hr (03/01/16 0804)    Active Problems:   Asthma   Sepsis ([place])   Chronic diastolic heart failure ([place])   Dehydration   Gastroparesis due to DM [facility])   Urinary retention   DKA, type 1, not at goal [facility])    Time spent: 30 minutes    Barton Dubois  [facility] Pager [phone]. If 7PM-7AM, please contact night-coverage at [email or web address], password [facility] 03/01/2016, 9:00 AM  LOS: 0 days

## 2016-03-02 DIAGNOSIS — E876 Hypokalemia: Secondary | ICD-10-CM

## 2016-03-02 LAB — GLUCOSE, CAPILLARY
GLUCOSE-CAPILLARY: 188 mg/dL — AB (ref 65–99)
GLUCOSE-CAPILLARY: 101 mg/dL — AB (ref 65–99)
GLUCOSE-CAPILLARY: 169 mg/dL — AB (ref 65–99)
GLUCOSE-CAPILLARY: 237 mg/dL — AB (ref 65–99)
GLUCOSE-CAPILLARY: 229 mg/dL — AB (ref 65–99)
Glucose-Capillary: 109 mg/dL — ABNORMAL HIGH (ref 65–99)
Glucose-Capillary: 135 mg/dL — ABNORMAL HIGH (ref 65–99)
Glucose-Capillary: 136 mg/dL — ABNORMAL HIGH (ref 65–99)
Glucose-Capillary: 152 mg/dL — ABNORMAL HIGH (ref 65–99)

## 2016-03-02 LAB — BASIC METABOLIC PANEL
ANION GAP: 7 (ref 5–15)
Anion gap: 10 (ref 5–15)
BUN: 6 mg/dL (ref 6–20)
BUN: 6 mg/dL (ref 6–20)
CALCIUM: 8.9 mg/dL (ref 8.9–10.3)
CO2: 18 mmol/L — ABNORMAL LOW (ref 22–32)
CO2: 21 mmol/L — AB (ref 22–32)
CREATININE: 0.37 mg/dL — AB (ref 0.44–1.00)
Calcium: 8.8 mg/dL — ABNORMAL LOW (ref 8.9–10.3)
Chloride: 110 mmol/L (ref 101–111)
Chloride: 111 mmol/L (ref 101–111)
Creatinine, Ser: 0.58 mg/dL (ref 0.44–1.00)
GFR calc Af Amer: >60 mL/min (ref >60)
GFR calc non Af Amer: >60 mL/min (ref >60)
Glucose, Bld: 135 mg/dL — ABNORMAL HIGH (ref 65–99)
Glucose, Bld: 144 mg/dL — ABNORMAL HIGH (ref 65–99)
Potassium: 3 mmol/L — ABNORMAL LOW (ref 3.5–5.1)
Potassium: 2.9 mmol/L — ABNORMAL LOW (ref 3.5–5.1)
SODIUM: 138 mmol/L (ref 135–145)
Sodium: 139 mmol/L (ref 135–145)

## 2016-03-02 LAB — MAGNESIUM: MAGNESIUM: 1.4 mg/dL — AB (ref 1.7–2.4)

## 2016-03-02 LAB — TROPONIN I
Troponin I: <0.03 ng/mL (ref <0.03)
Troponin I: <0.03 ng/mL (ref <0.03)
Troponin I: <0.03 ng/mL (ref <0.03)

## 2016-03-02 LAB — HEMOGLOBIN A1C
HEMOGLOBIN A1C: 8.9 % — AB (ref 4.8–5.6)
Mean Plasma Glucose: 209 mg/dL

## 2016-03-02 LAB — PHOSPHORUS: Phosphorus: 3 mg/dL (ref 2.5–4.6)

## 2016-03-02 LAB — PROCALCITONIN

## 2016-03-02 MED ORDER — SODIUM CHLORIDE 0.9 % IV SOLN
INTRAVENOUS | Status: DC
Start: 2016-03-02 — End: 2016-03-04
  Administered 2016-03-02 – 2016-03-04 (×5): via INTRAVENOUS

## 2016-03-02 MED ORDER — SODIUM CHLORIDE 0.9 % IV SOLN
30.0000 meq | Freq: Once | INTRAVENOUS | Status: AC
  Administered 2016-03-02: 30 meq via INTRAVENOUS
  Filled 2016-03-02: qty 15

## 2016-03-02 MED ORDER — INSULIN GLARGINE 100 UNIT/ML ~~LOC~~ SOLN
20.0000 [IU] | Freq: Every day | SUBCUTANEOUS | Status: DC
  Administered 2016-03-02 (×2): 20 [IU] via SUBCUTANEOUS
  Filled 2016-03-02 (×3): qty 0.2

## 2016-03-02 MED ORDER — INSULIN ASPART 100 UNIT/ML ~~LOC~~ SOLN
0.0000 [IU] | Freq: Three times a day (TID) | SUBCUTANEOUS | Status: DC
  Administered 2016-03-02: 2 [IU] via SUBCUTANEOUS
  Administered 2016-03-02 – 2016-03-03 (×2): 5 [IU] via SUBCUTANEOUS
  Administered 2016-03-03 – 2016-03-04 (×2): 3 [IU] via SUBCUTANEOUS
  Administered 2016-03-04: 2 [IU] via SUBCUTANEOUS

## 2016-03-02 MED ORDER — HYDRALAZINE HCL 20 MG/ML IJ SOLN
10.0000 mg | Freq: Three times a day (TID) | INTRAMUSCULAR | Status: DC | PRN
  Administered 2016-03-03: 10 mg via INTRAVENOUS
  Filled 2016-03-02: qty 1

## 2016-03-02 MED ORDER — MAGNESIUM SULFATE 2 GM/50ML IV SOLN
2.0000 g | Freq: Once | INTRAVENOUS | Status: AC
  Administered 2016-03-02: 2 g via INTRAVENOUS
  Filled 2016-03-02: qty 50

## 2016-03-02 MED ORDER — METOCLOPRAMIDE HCL 5 MG/ML IJ SOLN
10.0000 mg | Freq: Four times a day (QID) | INTRAMUSCULAR | Status: AC
  Administered 2016-03-02 – 2016-03-03 (×4): 10 mg via INTRAVENOUS
  Filled 2016-03-02 (×4): qty 2

## 2016-03-02 NOTE — Progress Notes (Signed)
[facility] PROGRESS NOTE  [name] [id number]:[id number] DOB: 1990/11/07 DOA: 02/29/2016 PCP: Vicenta Aly, FNP  Interim summary and HPI 26 y.o. female with medical history significant of DM type 1, recurrent DKA, gastroparesis, liver adenomas, anxiety, hepatic steatosis and asthma. Admitted for recurrent DKA, urinary retention and gastroparesis resulting in intractable nausea and vomiting.  Assessment/Plan: 1-DKA type 1: -continue IVF's, now transitioned off insulin drip -DKA has resolved -diet advance to modified carbohydrate -using lantus and SSI -follow CBG closely and adjust hypoglycemic regimen as needed  -patient DKA appears to be associated with medication non-compliance. No sources of infection appreciated. Troponin neg -continue PRN antiemetics  -continue supportive care  2-gastroparesis: -minimize narcotics -will continue reglan and PRN Bentyl -continue PPI -patient educated about sugar control and how important it is for better control of her gastroparesis   3-asthma: -stable and well controlled -continue home inhalers and Singulair  4-hx of chronic diastolic heart failure: -well compensated -will continue follow daily weights and I's and O's -given ongoing dehydration from GI loses, will maintain IVF's at current rate for another 24 hours. -assess volume closely  5-urinary retention  -will continue foley -outpatient follow up with urology  6-SIRS: patient met SIRS criteria on admission -most likely from DKA precess  -no sepsis currently (sepsis R/O) -procalcitonin < 0.10 -holding on abx's for now  7-hypokalemia and hypomagnesemia  -will replete and follow electrolytes levels.  Code Status: Full Family Communication: no family at bedside  Disposition Plan: remains inpatient; will transfer to telemetry bed (given elevated/fluctuating HR); transitioned of insulin drip and currently adjusting hypoglycemic regimen and advancing diet. DKA has  resolved. Will replete electrolytes (CBG 109, anion gap 10, CO2 18).   Consultants:  None   Procedures:  See below for x-ray reports   Antibiotics:  None   HPI/Subjective: Afebrile, no CP, no SOB. Reports feeling slightly better today. Still nauseated and continue complaining of intermittent abd pain.   Objective: Vitals:   03/02/16 0400 03/02/16 0600  BP: 104/71 111/81  Pulse: 100 97  Resp: (!) 21 17  Temp:      Intake/Output Summary (Last 24 hours) at 03/02/16 0755 Last data filed at 03/02/16 0400  Gross per 24 hour  Intake           2416.7 ml  Output             1125 ml  Net           1291.7 ml   Filed Weights   02/29/16 2203 03/02/16 0400  Weight: 50 kg (110 lb 3.7 oz) 49.9 kg (110 lb 0.2 oz)    Exam:   General: afebrile, no CP. Continue to report pain in her abdomen and is slightly nauseated. Endorses feeling slightly better.    Cardiovascular: tachycardic, no rubs, no gallops, no murmurs  Respiratory: CTA bilaterally   Abdomen: soft, no guarding, tender to palpation on her epigastric/mid abdomen; positive BS  Musculoskeletal: no edema, no cyanosis or clubbing   Data Reviewed: Basic Metabolic Panel:  Recent Labs Lab 03/01/16 0124  03/01/16 0855 03/01/16 1229 03/01/16 2111 03/02/16 0054 03/02/16 0320 03/02/16 0600  NA 139  < > 138 139 138 138 139  --   K 4.6  < > 4.0 3.7 4.0 3.0* 2.9*  --   CL 116*  < > 113* 112* 109 110 111  --   CO2 12*  < > 12* 16* 20* 21* 18*  --   GLUCOSE 137*  < > 175* 138* 136*  135* 144*  --   BUN 9  < > 8 7 7 6 6   --   CREATININE 0.73  < > 0.65 0.48 0.59 0.37* 0.58  --   CALCIUM 8.6*  < > 9.0 9.1 9.3 8.9 8.8*  --   MG 1.8  --   --   --   --   --   --  1.4*  PHOS 1.7*  --   --   --   --   --   --  3.0  < > = values in this interval not displayed. Liver Function Tests:  Recent Labs Lab 02/28/16 2226 02/29/16 1645  AST 33 21  ALT 13* 13*  ALKPHOS 105 97  BILITOT 0.6 2.0*  PROT 8.1 7.4  ALBUMIN 4.3 4.1     Recent Labs Lab 02/28/16 2226 02/29/16 1645  LIPASE 12 <10*   CBC:  Recent Labs Lab 02/28/16 2226 02/29/16 1645  WBC 12.6* 15.5*  NEUTROABS  --  12.2*  HGB 13.0 12.7  HCT 38.0 38.1  MCV 88.0 89.9  PLT 580* 427*   Cardiac Enzymes:  Recent Labs Lab 03/01/16 0605 03/01/16 1229 03/01/16 2111 03/02/16 0054 03/02/16 0600  TROPONINI <0.03 <0.03 <0.03 <0.03 <0.03   BNP (last 3 results)  Recent Labs  04/11/15 0325 07/11/15 0353  BNP 43.5 6.9   CBG:  Recent Labs Lab 03/02/16 0017 03/02/16 0121 03/02/16 0220 03/02/16 0320 03/02/16 0420  GLUCAP 136* 135* 188* 152* 109*    Recent Results (from the past 240 hour(s))  MRSA PCR Screening     Status: None   Collection Time: 03/01/16  3:16 AM  Result Value Ref Range Status   MRSA by PCR NEGATIVE NEGATIVE Final    Comment:        The GeneXpert MRSA Assay (FDA approved for NASAL specimens only), is one component of a comprehensive MRSA colonization surveillance program. It is not intended to diagnose MRSA infection nor to guide or monitor treatment for MRSA infections.      Studies: Dg Chest Port 1 View  Result Date: 02/29/2016 CLINICAL DATA:  Leukocytosis nausea and vomiting EXAM: PORTABLE CHEST 1 VIEW COMPARISON:  02/20/2016 FINDINGS: The heart size and mediastinal contours are within normal limits. Both lungs are clear. The visualized skeletal structures are unremarkable. IMPRESSION: No active disease. Electronically Signed   By: Donavan Foil M.D.   On: 02/29/2016 22:33    Scheduled Meds: . enoxaparin (LOVENOX) injection  40 mg Subcutaneous QHS  . escitalopram  5 mg Oral Daily  . famotidine  20 mg Oral QHS  . insulin aspart  0-9 Units Subcutaneous TID WC  . insulin glargine  20 Units Subcutaneous QHS  . magnesium sulfate 1 - 4 g bolus IVPB  2 g Intravenous Once  . mometasone-formoterol  2 puff Inhalation BID  . montelukast  10 mg Oral Daily  . pantoprazole  40 mg Oral BID  . potassium chloride  (KCL MULTIRUN) 30 mEq in 265 mL IVPB  30 mEq Intravenous Once   Continuous Infusions:   Active Problems:   Asthma   DKA, type 1 ([place])   Sepsis ([place])   Chronic diastolic heart failure ([place])   Dehydration   Gastroparesis due to DM [facility])   Urinary retention   DKA, type 1, not at goal [facility])    Time spent: 25 minutes    Barton Dubois  [facility] Pager [phone]. If 7PM-7AM, please contact night-coverage at [email or web address], password [facility] 03/02/2016, 7:55 AM  LOS: 1 day

## 2016-03-03 DIAGNOSIS — K210 Gastro-esophageal reflux disease with esophagitis: Secondary | ICD-10-CM

## 2016-03-03 LAB — BASIC METABOLIC PANEL
Anion gap: 7 (ref 5–15)
BUN: 6 mg/dL (ref 6–20)
CALCIUM: 8.2 mg/dL — AB (ref 8.9–10.3)
CO2: 21 mmol/L — AB (ref 22–32)
Chloride: 108 mmol/L (ref 101–111)
Creatinine, Ser: 0.48 mg/dL (ref 0.44–1.00)
GFR calc Af Amer: >60 mL/min (ref >60)
GLUCOSE: 307 mg/dL — AB (ref 65–99)
Potassium: 3.3 mmol/L — ABNORMAL LOW (ref 3.5–5.1)
Sodium: 136 mmol/L (ref 135–145)

## 2016-03-03 LAB — CBC
HCT: 31.6 % — ABNORMAL LOW (ref 36.0–46.0)
Hemoglobin: 10.5 g/dL — ABNORMAL LOW (ref 12.0–15.0)
MCH: 29.2 pg (ref 26.0–34.0)
MCHC: 33.2 g/dL (ref 30.0–36.0)
MCV: 88 fL (ref 78.0–100.0)
PLATELETS: 353 10*3/uL (ref 150–400)
RBC: 3.59 MIL/uL — ABNORMAL LOW (ref 3.87–5.11)
RDW: 14.2 % (ref 11.5–15.5)
WBC: 7.5 10*3/uL (ref 4.0–10.5)

## 2016-03-03 LAB — GLUCOSE, CAPILLARY
GLUCOSE-CAPILLARY: 185 mg/dL — AB (ref 65–99)
Glucose-Capillary: 236 mg/dL — ABNORMAL HIGH (ref 65–99)
Glucose-Capillary: 113 mg/dL — ABNORMAL HIGH (ref 65–99)
Glucose-Capillary: 268 mg/dL — ABNORMAL HIGH (ref 65–99)

## 2016-03-03 LAB — TROPONIN I: Troponin I: <0.03 ng/mL (ref <0.03)

## 2016-03-03 LAB — MAGNESIUM: MAGNESIUM: 1.6 mg/dL — AB (ref 1.7–2.4)

## 2016-03-03 MED ORDER — ACETAMINOPHEN 325 MG PO TABS: 650.0000 mg | ORAL_TABLET | ORAL | Status: DC | PRN

## 2016-03-03 MED ORDER — PHENYLEPHRINE HCL 0.25 % NA SOLN
2.0000 | Freq: Four times a day (QID) | NASAL | Status: DC | PRN
  Administered 2016-03-03: 2 via NASAL
  Filled 2016-03-03: qty 15

## 2016-03-03 MED ORDER — INSULIN GLARGINE 100 UNIT/ML ~~LOC~~ SOLN
25.0000 [IU] | Freq: Every day | SUBCUTANEOUS | Status: DC
  Administered 2016-03-03: 25 [IU] via SUBCUTANEOUS
  Filled 2016-03-03 (×2): qty 0.25

## 2016-03-03 MED ORDER — SODIUM CHLORIDE 0.9 % IV SOLN
30.0000 meq | Freq: Four times a day (QID) | INTRAVENOUS | Status: AC
  Administered 2016-03-03 (×2): 30 meq via INTRAVENOUS
  Filled 2016-03-03 (×2): qty 15

## 2016-03-03 MED ORDER — METOCLOPRAMIDE HCL 5 MG PO TABS
5.0000 mg | ORAL_TABLET | Freq: Three times a day (TID) | ORAL | Status: DC
  Administered 2016-03-03 – 2016-03-04 (×3): 5 mg via ORAL
  Filled 2016-03-03 (×3): qty 1

## 2016-03-03 MED ORDER — MAGNESIUM SULFATE 2 GM/50ML IV SOLN
2.0000 g | Freq: Once | INTRAVENOUS | Status: AC
  Administered 2016-03-03: 2 g via INTRAVENOUS
  Filled 2016-03-03: qty 50

## 2016-03-03 NOTE — Progress Notes (Signed)
[facility] PROGRESS NOTE  Tesha Archambeau IDP:[id number] DOB: September 19, 1990 DOA: 02/29/2016 PCP: Vicenta Aly, FNP  Interim summary and HPI 26 y.o. female with medical history significant of DM type 1, recurrent DKA, gastroparesis, liver adenomas, anxiety, hepatic steatosis and asthma. Admitted for recurrent DKA, urinary retention and gastroparesis resulting in intractable nausea and vomiting.  Assessment/Plan: 1-DKA type 1: -continue IVF's, now transitioned off insulin drip -DKA has resolved -diet advance to modified carbohydrate -using lantus (adjusted to home regimen dose, 25 units) and SSI -follow CBG closely and adjust hypoglycemic regimen as needed  -patient DKA appears to be associated with medication non-compliance. No sources of infection appreciated. Troponin neg -continue PRN antiemetics  -continue supportive care  2-gastroparesis: -minimize narcotics -will continue reglan (now by mouth) and PRN Bentyl -continue PPI -patient educated about sugar control and how important it is for better control of her gastroparesis   3-asthma: -stable and well controlled -continue home inhalers and Singulair  4-hx of chronic diastolic heart failure: -well compensated -will continue follow daily weights and I's and O's -given ongoing dehydration from GI loses, will maintain IVF's at current rate for another 24 hours. -assess volume closely  5-urinary retention  -will continue foley for now -outpatient follow up with urology (needs appointment)  6-SIRS: patient met SIRS criteria on admission -most likely from DKA precess  -no sepsis currently (sepsis R/O) -procalcitonin < 0.10 -holding on abx's for now  7-hypokalemia and hypomagnesemia  -will replete and follow electrolytes levels.  Code Status: Full Family Communication: no family at bedside  Disposition Plan: remains inpatient; will transfer to telemetry bed (given elevated/fluctuating HR); transitioned of insulin  drip and currently adjusting hypoglycemic regimen and advancing diet. DKA has resolved. Will replete electrolytes (potassium and Mg); also will try to control her ongoing vomiting.    Consultants:  None   Procedures:  See below for x-ray reports   Antibiotics:  None   HPI/Subjective: Afebrile, no CP, no SOB. Patient complaining of active vomiting and with nausea. Pain is better in her abd. Sugar in 200-300 range  Objective: Vitals:   03/03/16 0538 03/03/16 1429  BP: 114/80 (!) 167/116  Pulse: 87 (!) 111  Resp: 18 20  Temp: 98.4 F (36.9 C) 98.5 F (36.9 C)    Intake/Output Summary (Last 24 hours) at 03/03/16 1746 Last data filed at 03/03/16 1452  Gross per 24 hour  Intake             3600 ml  Output             3525 ml  Net               75 ml   Filed Weights   02/29/16 2203 03/02/16 0400 03/03/16 0542  Weight: 50 kg (110 lb 3.7 oz) 49.9 kg (110 lb 0.2 oz) 50.3 kg (111 lb)    Exam:   General: afebrile. Reports some improvement in her abd; but actively vomiting and with elevated BP today. (SB 160's range and DBP 110 range). Sugar in 200-300 range. No CP, no hematemesis, no SOB..    Cardiovascular: tachycardic, no rubs, no gallops, no murmurs  Respiratory: CTA bilaterally   Abdomen: soft, no guarding, tender to palpation on her epigastric/mid abdomen; positive BS  Musculoskeletal: no edema, no cyanosis or clubbing   Data Reviewed: Basic Metabolic Panel:  Recent Labs Lab 03/01/16 0124  03/01/16 1229 03/01/16 2111 03/02/16 0054 03/02/16 0320 03/02/16 0600 03/03/16 0525  NA 139  < > 139 138 138 139  --  136  K 4.6  < > 3.7 4.0 3.0* 2.9*  --  3.3*  CL 116*  < > 112* 109 110 111  --  108  CO2 12*  < > 16* 20* 21* 18*  --  21*  GLUCOSE 137*  < > 138* 136* 135* 144*  --  307*  BUN 9  < > _0 --  6  CREATININE 0.73  < > 0.48 0.59 0.37* 0.58  --  0.48  CALCIUM 8.6*  < > 9.1 9.3 8.9 8.8*  --  8.2*  MG 1.8  --   --   --   --   --  1.4* 1.6*  PHOS  1.7*  --   --   --   --   --  3.0  --   < > = values in this interval not displayed. Liver Function Tests:  Recent Labs Lab 02/28/16 2226 02/29/16 1645  AST 33 21  ALT 13* 13*  ALKPHOS 105 97  BILITOT 0.6 2.0*  PROT 8.1 7.4  ALBUMIN 4.3 4.1    Recent Labs Lab 02/28/16 2226 02/29/16 1645  LIPASE 12 <10*   CBC:  Recent Labs Lab 02/28/16 2226 02/29/16 1645 03/03/16 0525  WBC 12.6* 15.5* 7.5  NEUTROABS  --  12.2*  --   HGB 13.0 12.7 10.5*  HCT 38.0 38.1 31.6*  MCV 88.0 89.9 88.0  PLT 580* 427* 353   Cardiac Enzymes:  Recent Labs Lab 03/02/16 0600 03/02/16 1205 03/02/16 1806 03/02/16 2353 03/03/16 0525  TROPONINI <0.03 <0.03 <0.03 <0.03 <0.03   BNP (last 3 results)  Recent Labs  04/11/15 0325 07/11/15 0353  BNP 43.5 6.9   CBG:  Recent Labs Lab 03/02/16 1646 03/02/16 2221 03/03/16 0727 03/03/16 1148 03/03/16 1624  GLUCAP 237* 229* 236* 113* 268*    Recent Results (from the past 240 hour(s))  Culture, blood (routine x 2)     Status: None (Preliminary result)   Collection Time: 03/01/16  1:30 AM  Result Value Ref Range Status   Specimen Description BLOOD RIGHT ANTECUBITAL  Final   Special Requests BOTTLES DRAWN AEROBIC ONLY 5CC  Final   Culture   Final    NO GROWTH 2 DAYS Performed at [facility], [place] [address] [place], [place] [address]    Report Status PENDING  Incomplete  Culture, blood (routine x 2)     Status: None (Preliminary result)   Collection Time: 03/01/16  1:30 AM  Result Value Ref Range Status   Specimen Description BLOOD BLOOD RIGHT FOREARM  Final   Special Requests BOTTLES DRAWN AEROBIC ONLY 5CC  Final   Culture   Final    NO GROWTH 2 DAYS Performed at [facility], [place] [address], [place], [place] [address]    Report Status PENDING  Incomplete  MRSA PCR Screening     Status: None   Collection Time: 03/01/16  3:16 AM  Result Value Ref Range Status   MRSA by PCR NEGATIVE NEGATIVE Final    Comment:         The GeneXpert MRSA Assay (FDA approved for NASAL specimens only), is one component of a comprehensive MRSA colonization surveillance program. It is not intended to diagnose MRSA infection nor to guide or monitor treatment for MRSA infections.      Studies: No results found.  Scheduled Meds: . enoxaparin (LOVENOX) injection  40 mg Subcutaneous QHS  . escitalopram  5 mg Oral Daily  .  famotidine  20 mg Oral QHS  . insulin aspart  0-9 Units Subcutaneous TID WC  . insulin glargine  20 Units Subcutaneous QHS  . mometasone-formoterol  2 puff Inhalation BID  . montelukast  10 mg Oral Daily  . pantoprazole  40 mg Oral BID   Continuous Infusions: . sodium chloride 100 mL/hr at 03/03/16 1658    Active Problems:   Asthma   DKA, type 1 ([place])   Sepsis ([place])   Chronic diastolic heart failure ([place])   Dehydration   Gastroparesis due to DM ([place])   Urinary retention   DKA, type 1, not at goal [facility])    Time spent: 25 minutes    [facility] Pager [phone]. If 7PM-7AM, please contact night-coverage at [email or web address], password [facility] 03/03/2016, 5:46 PM  LOS: 2 days

## 2016-03-03 NOTE — Care Management Note (Signed)
Case Management Note  Patient Details  Name: [name] MRN: [id number] Date of Birth: 1990/06/03  Subjective/Objective:   26 y/o f admitted w/DKA. From home. Readmit.                 Action/Plan:d/c plan home.   Expected Discharge Date:                  Expected Discharge Plan:  Home/Self Care  In-House Referral:     Discharge planning Services  CM Consult  Post Acute Care Choice:    Choice offered to:     DME Arranged:    DME Agency:     HH Arranged:    HH Agency:     Status of Service:  In process, will continue to follow  If discussed at Long Length of Stay Meetings, dates discussed:    Additional Comments:  Dessa Phi, RN 03/03/2016, 1:23 PM

## 2016-03-04 DIAGNOSIS — J4520 Mild intermittent asthma, uncomplicated: Secondary | ICD-10-CM

## 2016-03-04 DIAGNOSIS — E1010 Type 1 diabetes mellitus with ketoacidosis without coma: Principal | ICD-10-CM

## 2016-03-04 LAB — BASIC METABOLIC PANEL
ANION GAP: 5 (ref 5–15)
BUN: 6 mg/dL (ref 6–20)
CHLORIDE: 109 mmol/L (ref 101–111)
CO2: 24 mmol/L (ref 22–32)
Calcium: 9 mg/dL (ref 8.9–10.3)
Creatinine, Ser: 0.48 mg/dL (ref 0.44–1.00)
GFR calc Af Amer: >60 mL/min (ref >60)
GFR calc non Af Amer: >60 mL/min (ref >60)
GLUCOSE: 182 mg/dL — AB (ref 65–99)
POTASSIUM: 3.2 mmol/L — AB (ref 3.5–5.1)
Sodium: 138 mmol/L (ref 135–145)

## 2016-03-04 LAB — GLUCOSE, CAPILLARY
Glucose-Capillary: 153 mg/dL — ABNORMAL HIGH (ref 65–99)
Glucose-Capillary: 241 mg/dL — ABNORMAL HIGH (ref 65–99)

## 2016-03-04 LAB — MAGNESIUM: Magnesium: 1.6 mg/dL — ABNORMAL LOW (ref 1.7–2.4)

## 2016-03-04 MED ORDER — POTASSIUM CHLORIDE CRYS ER 20 MEQ PO TBCR: 40.0000 meq | EXTENDED_RELEASE_TABLET | Freq: Every day | ORAL | 0 refills | Status: DC

## 2016-03-04 MED ORDER — POTASSIUM CHLORIDE CRYS ER 20 MEQ PO TBCR
40.0000 meq | EXTENDED_RELEASE_TABLET | Freq: Two times a day (BID) | ORAL | Status: DC
  Administered 2016-03-04: 40 meq via ORAL
  Filled 2016-03-04: qty 2

## 2016-03-04 MED ORDER — MAGNESIUM SULFATE 4 GM/100ML IV SOLN
4.0000 g | Freq: Once | INTRAVENOUS | Status: AC
  Administered 2016-03-04: 4 g via INTRAVENOUS
  Filled 2016-03-04: qty 100

## 2016-03-04 MED ORDER — MAGNESIUM OXIDE 400 MG PO TABS: 800.0000 mg | ORAL_TABLET | Freq: Every day | ORAL | 0 refills | Status: DC

## 2016-03-04 NOTE — Care Management Note (Signed)
Case Management Note  Patient Details  Name: [name] MRN: [id number] Date of Birth: July 01, 1990  Subjective/Objective: atient states she is able to get meds,has pharmacy,has glucometer,has pcp, & has support. No CM needs or orders.                  Action/Plan:d/c home.   Expected Discharge Date:  03/04/16               Expected Discharge Plan:  Home/Self Care  In-House Referral:     Discharge planning Services  CM Consult  Post Acute Care Choice:    Choice offered to:     DME Arranged:    DME Agency:     HH Arranged:    HH Agency:     Status of Service:  Completed, signed off  If discussed at [employer] of Stay Meetings, dates discussed:    Additional Comments:  Dessa Phi, RN 03/04/2016, 11:34 AM

## 2016-03-04 NOTE — Discharge Summary (Signed)
Physician Discharge Summary  [name] [id number] DOB: 03/07/1990 DOA: 02/29/2016  PCP: Vicenta Aly, FNP  Admit date: 02/29/2016 Discharge date: 03/04/2016  Time spent: 35 minutes  Recommendations for Outpatient Follow-up:  1. Patient will need follow-up with primary care physician and monitoring of her condition 2. Will need a bmet in about one month 3. She will need a chronic magnesium replacement as an outpatient and recheck on her lab 4. Patient should obtain her insulin and is advised to continue using her glucometer to check her blood sugars 5. She has demonstrated not lives in the past and has had frequent readmissions  Discharge Diagnoses:  Active Problems:   Asthma   DKA, type 1 ([place])   Sepsis ([place])   Chronic diastolic heart failure ([facility])   Dehydration   Gastroparesis due to DM [facility])   Urinary retention   DKA, type 1, not at goal [facility])   Discharge Condition: Alert pleasant  Diet recommendation: Diabetic heart healthy  Filed Weights   03/02/16 0400 03/03/16 0542 03/04/16 0535  Weight: 49.9 kg (110 lb 0.2 oz) 50.3 kg (111 lb) 49.1 kg (108 lb 3.9 oz)    History of present illness:   26 y.o. ? DM type 1,  recurrent DKA,  Gastroparesis, liver adenomas,  anxiety, hepatic steatosis   asthma.  She has Chr pain as well and  When opioids decreased typically leaves AMA Re-admitted for recurrent DKA 02/29/12, urinary retention and gastroparesis resulting in intractable nausea and vomiting.  Has been admitted  2/1-->02/25/16 1/3--01/25/16 12/29--12/31 10/30->11/04  Hospital Course:  Patient was admitted to stepdown unit and was initially There but subsequently rapidly transitioned off the [place] She was taken off multiple narcotic's and her urinary symptom and in terms of her retention were improved She had a sensation to void on discharge and catheter was removed and an outpatient follow-up visit with She should be stable for discharge and was  counseled strongly regarding compliance with medications Her electrolytes were low in terms of potassium and magnesium and these were replaced aggressively Blood sugars well controlled on discharge Once again compliance was strongly recommended   Discharge Exam: Vitals:   03/03/16 2331 03/04/16 0535  BP:  (!) 126/91  Pulse:  96  Resp:  16  Temp: 98.6 F (37 C) 98.5 F (36.9 C)   Alert doesn't oriented no apparent distress Tolerating some diet No chest pain Mild pallor  General: Alert frail no apparent distress Cardiovascular: S1-S2 no murmur rub or gallop Respiratory: Mild crackles on the posterior right side but no other issues  Discharge Instructions   Discharge Instructions    Diet - low sodium heart healthy    Complete by:  As directed    Discharge instructions    Complete by:  As directed    Make sure that you continue your home doses of insulin without fail and monitor your blood sugars appropriately You will need to have labs done in about a week's time Please make sure that if you need to you get follow-up for your urinary problems as an outpatient and I do not think you'll need a catheter   Increase activity slowly    Complete by:  As directed      Current Discharge Medication List    CONTINUE these medications which have CHANGED   Details  magnesium oxide (MAG-OX) 400 MG tablet Take 2 tablets (800 mg total) by mouth daily. Qty: 30 tablet, Refills: 0      CONTINUE these medications which have  NOT CHANGED   Details  albuterol (PROVENTIL HFA;VENTOLIN HFA) 108 (90 BASE) MCG/ACT inhaler Inhale 2 puffs into the lungs every 6 (six) hours as needed for shortness of breath.    albuterol (PROVENTIL) (2.5 MG/3ML) 0.083% nebulizer solution Take 2.5 mg by nebulization every 6 (six) hours as needed for shortness of breath.    budesonide-formoterol (SYMBICORT) 160-4.5 MCG/ACT inhaler Inhale 2 puffs into the lungs daily.     escitalopram (LEXAPRO) 10 MG tablet Take  0.5 tablets (5 mg total) by mouth daily. Qty: 30 tablet, Refills: 0    famotidine (PEPCID) 20 MG tablet Take 1 tablet (20 mg total) by mouth at bedtime. Qty: 30 tablet, Refills: 0    Insulin Glargine (BASAGLAR KWIKPEN) 100 UNIT/ML SOPN Inject 0.25 mLs (25 Units total) into the skin at bedtime. Qty: 1 pen, Refills: 3    montelukast (SINGULAIR) 10 MG tablet Take 10 mg by mouth daily.    NOVOLOG FLEXPEN 100 UNIT/ML FlexPen 1 UNIT FOR EVERY 10 GRAMS CARB W/MEALS/SNACKS PLUS SLIDING SCALE: 1 UNIT GLUCOSE 50 ABOVE 150.TDD 20 Refills: 5    pantoprazole (PROTONIX) 40 MG tablet Take 1 tablet (40 mg total) by mouth 2 (two) times daily. Qty: 60 tablet, Refills: 1    Vitamin D, Ergocalciferol, (DRISDOL) 50000 units CAPS capsule TAKE ONE CAPSULE (50,000 UNITS TOTAL) BY MOUTH ONCE A WEEK. Saturday Refills: 0    fluconazole (DIFLUCAN) 100 MG tablet Take 1 tablet (100 mg total) by mouth daily. Qty: 5 tablet, Refills: 0    metoCLOPramide (REGLAN) 10 MG tablet Take 1 tablet (10 mg total) by mouth 3 (three) times daily before meals. Qty: 20 tablet, Refills: 0    potassium chloride SA (K-DUR,KLOR-CON) 20 MEQ tablet Take 1 tablet (20 mEq total) by mouth daily. Qty: 5 tablet, Refills: 0       Allergies  Allergen Reactions  . Peanut-Containing Drug Products Swelling    Swelling of mouth, lips  . Strawberry Extract Swelling    Swelling of mouth, lips  . Ultram [Tramadol] Itching      The results of significant diagnostics from this hospitalization (including imaging, microbiology, ancillary and laboratory) are listed below for reference.    Significant Diagnostic Studies: Ct Abdomen Pelvis Wo Contrast  Result Date: 02/20/2016 CLINICAL DATA:  Diabetic patient with abdominal pain coffee ground emesis today. Recent diagnosis of pancreatitis. EXAM: CT ABDOMEN AND PELVIS WITHOUT CONTRAST TECHNIQUE: Multidetector CT imaging of the abdomen and pelvis was performed following the standard protocol  without IV contrast. COMPARISON:  CT abdomen and pelvis 11/02/2015 and 04/21/2015. MRI abdomen 11/27/2014. FINDINGS: Lower chest: Lung bases are clear. No pleural or pericardial effusion. Hepatobiliary: Status post cholecystectomy. Three low attenuating lesions in the liver measuring 1.8 cm on, 1.5 cm on image 16 image 23 and 2.5 cm on image 35 are unchanged and compatible with adenomas based on prior imaging. Pancreas: Unremarkable. No pancreatic ductal dilatation or surrounding inflammatory changes. Spleen: Normal in size without focal abnormality. Adrenals/Urinary Tract: The adrenal glands are unremarkable. Mild right hydronephrosis is presumably due to marked bladder distention which is present on the prior exams. The left kidney appears normal. No urinary tract stone is identified. Stomach/Bowel: Stomach is within normal limits. Appendix appears normal. No evidence of bowel wall thickening, distention, or inflammatory changes. Vascular/Lymphatic: No significant vascular findings are present. No enlarged abdominal or pelvic lymph nodes. Reproductive: Uterus and bilateral adnexa are unremarkable. Other: No abdominal wall hernia or abnormality. No abdominopelvic ascites. Musculoskeletal: Negative. IMPRESSION: No acute abnormality.  Mild right hydronephrosis is presumably due to marked distention of the urinary bladder, a chronic abnormality. Cause for distention is not identified. No change in 3 liver lesions presumably representing adenomas based on prior exams. Electronically Signed   By: Inge Rise M.D.   On: 02/20/2016 12:29   Dg Chest Port 1 View  Result Date: 02/29/2016 CLINICAL DATA:  Leukocytosis nausea and vomiting EXAM: PORTABLE CHEST 1 VIEW COMPARISON:  02/20/2016 FINDINGS: The heart size and mediastinal contours are within normal limits. Both lungs are clear. The visualized skeletal structures are unremarkable. IMPRESSION: No active disease. Electronically Signed   By: Donavan Foil M.D.   On:  02/29/2016 22:33   Dg Chest Port 1 View  Result Date: 02/20/2016 CLINICAL DATA:  Nausea and vomiting. EXAM: PORTABLE CHEST 1 VIEW COMPARISON:  01/17/2016 FINDINGS: The cardiomediastinal contours are normal. Bronchial thickening. Pulmonary vasculature is normal. No consolidation, pleural effusion, or pneumothorax. No acute osseous abnormalities are seen. IMPRESSION: Bronchial thickening.  No focal airspace consolidation Electronically Signed   By: Jeb Levering M.D.   On: 02/20/2016 04:40   Dg Abd 2 Views  Result Date: 02/05/2016 CLINICAL DATA:  Intractable nausea and vomiting. Mid abdominal pain. EXAM: ABDOMEN - 2 VIEW COMPARISON:  Most recent radiographs 01/17/2016. Most recent CT 11/02/2015 FINDINGS: No evidence of free air on decubitus view. There is air within the stomach which is nondistended. Mottled appearing air to the right of the upper lumbar spine is nonspecific. Relative paucity of bowel gas in the lower abdomen and pelvis. Pelvic phlebolith is noted. Cholecystectomy clips in the right upper quadrant. No acute osseous abnormalities IMPRESSION: Mottled bowel air in the right upper abdomen, may be air within small bowel/duodenum, however is nonspecific. If there is concern for to duodenal perforation, recommend CT. There is otherwise a relative paucity of bowel gas in the lower abdomen and pelvis. Electronically Signed   By: Jeb Levering M.D.   On: 02/05/2016 04:36    Microbiology: Recent Results (from the past 240 hour(s))  Culture, blood (routine x 2)     Status: None (Preliminary result)   Collection Time: 03/01/16  1:30 AM  Result Value Ref Range Status   Specimen Description BLOOD RIGHT ANTECUBITAL  Final   Special Requests BOTTLES DRAWN AEROBIC ONLY 5CC  Final   Culture   Final    NO GROWTH 2 DAYS Performed at [facility], [address], [place], [place] [address]    Report Status PENDING  Incomplete  Culture, blood (routine x 2)     Status: None (Preliminary  result)   Collection Time: 03/01/16  1:30 AM  Result Value Ref Range Status   Specimen Description BLOOD BLOOD RIGHT FOREARM  Final   Special Requests BOTTLES DRAWN AEROBIC ONLY 5CC  Final   Culture   Final    NO GROWTH 2 DAYS Performed at [facility], [place] [address], [place], [place] [address]    Report Status PENDING  Incomplete  MRSA PCR Screening     Status: None   Collection Time: 03/01/16  3:16 AM  Result Value Ref Range Status   MRSA by PCR NEGATIVE NEGATIVE Final    Comment:        The GeneXpert MRSA Assay (FDA approved for NASAL specimens only), is one component of a comprehensive MRSA colonization surveillance program. It is not intended to diagnose MRSA infection nor to guide or monitor treatment for MRSA infections.      Labs: Basic Metabolic  Panel:  Recent Labs Lab 03/01/16 0124  03/01/16 2111 03/02/16 0054 03/02/16 0320 03/02/16 0600 03/03/16 0525 03/04/16 0512  NA 139  < > 138 138 139  --  136 138  K 4.6  < > 4.0 3.0* 2.9*  --  3.3* 3.2*  CL 116*  < > 109 110 111  --  108 109  CO2 12*  < > 20* 21* 18*  --  21* 24  GLUCOSE 137*  < > 136* 135* 144*  --  307* 182*  BUN 9  < > 7 6 6   --  6 6  CREATININE 0.73  < > 0.59 0.37* 0.58  --  0.48 0.48  CALCIUM 8.6*  < > 9.3 8.9 8.8*  --  8.2* 9.0  MG 1.8  --   --   --   --  1.4* 1.6* 1.6*  PHOS 1.7*  --   --   --   --  3.0  --   --   < > = values in this interval not displayed. Liver Function Tests:  Recent Labs Lab 02/28/16 2226 02/29/16 1645  AST 33 21  ALT 13* 13*  ALKPHOS 105 97  BILITOT 0.6 2.0*  PROT 8.1 7.4  ALBUMIN 4.3 4.1    Recent Labs Lab 02/28/16 2226 02/29/16 1645  LIPASE 12 <10*   No results for input(s): AMMONIA in the last 168 hours. CBC:  Recent Labs Lab 02/28/16 2226 02/29/16 1645 03/03/16 0525  WBC 12.6* 15.5* 7.5  NEUTROABS  --  12.2*  --   HGB 13.0 12.7 10.5*  HCT 38.0 38.1 31.6*  MCV 88.0 89.9 88.0  PLT 580* 427* 353   Cardiac Enzymes:  Recent  Labs Lab 03/02/16 0600 03/02/16 1205 03/02/16 1806 03/02/16 2353 03/03/16 0525  TROPONINI <0.03 <0.03 <0.03 <0.03 <0.03   BNP: BNP (last 3 results)  Recent Labs  04/11/15 0325 07/11/15 0353  BNP 43.5 6.9    ProBNP (last 3 results) No results for input(s): PROBNP in the last 8760 hours.  CBG:  Recent Labs Lab 03/03/16 0727 03/03/16 1148 03/03/16 1624 03/03/16 2137 03/04/16 0730  GLUCAP 236* 113* 268* 185* 153*       Signed:  Nita Sells MD   [facility] 03/04/2016, 11:23 AM

## 2016-03-04 NOTE — Progress Notes (Signed)
Patients foley was discontinued. Pt was able to void x 1.

## 2016-03-04 NOTE — Progress Notes (Signed)
Nutrition Brief Note  Patient identified during rounds this AM.   Wt Readings from Last 15 Encounters:  03/04/16 108 lb 3.9 oz (49.1 kg)  02/28/16 125 lb (56.7 kg)  02/20/16 116 lb (52.6 kg)  02/05/16 116 lb (52.6 kg)  01/22/16 116 lb 6.5 oz (52.8 kg)  01/17/16 115 lb 1.3 oz (52.2 kg)  11/18/15 107 lb 9.4 oz (48.8 kg)  11/09/15 116 lb 2.9 oz (52.7 kg)  11/06/15 134 lb 8 oz (61 kg)  07/10/15 105 lb 1.6 oz (47.7 kg)  07/03/15 112 lb 3.4 oz (50.9 kg)  06/22/15 102 lb (46.3 kg)  06/10/15 102 lb (46.3 kg)  06/07/15 115 lb (52.2 kg)  05/28/15 113 lb 15.7 oz (51.7 kg)    Body mass index is 18.58 kg/m. Patient meets criteria for normal weight/borderline underweight based on current BMI. Skin WDL. Physical assessment performed and did not show any muscle or any fat wasting at this time. Unable to state malnutrition at this as unable to truly quantify PO intakes PTA; pt is at very high risk based on recurrent DKA, worsening of gastroparesis, and symptoms of these causing periods of poor PO intakes.   Current diet order is Carb Modified. No intakes documented earlier in this admission; pt consumed 50% of breakfast this AM without any issues. She states that at home she takes Reglan every 4 hours or every 6 hours and that she also takes Zofran as needed to help with symptoms of gastroparesis. She tries to eat smaller, frequent meals to also assist. Despite these things, she feels that her gastroparesis has been worsening and that the Reglan is not been helping much.   She states that her weight fluctuates; that it decreases during times of DKA or issues with gastroparesis but then trends back up when she is able to eat as she is able. This is consistent with weight trends outlined above. Pt reported during rounds that she is taking insulin as instructed.   Pt denied any questions or concerns at this time. Highly encouraged her to alert RN should she have questions prior to d/c so that RD can  re-visit.   Medications reviewed; sliding scale Novolog, 25 units Lantus/day, 5 mg oral Reglan TID.  Labs reviewed; CBG: 153 mg/dL this AM.  No nutrition interventions warranted at this time. If nutrition issues arise, please consult RD.     Jarome Matin, MS, RD, LDN, [facility] Inpatient Clinical Dietitian Pager # [phone] After hours/weekend pager # [phone]

## 2016-03-04 NOTE — Progress Notes (Signed)
Patient discharge. Pt is a&ox4, ambulatory. Pt was encouraged to schedule post dc appt. Pt request to schedule herself. Questions, concerns denied.

## 2016-03-06 LAB — CULTURE, BLOOD (ROUTINE X 2)
Culture: NO GROWTH
Culture: NO GROWTH

## 2016-05-04 ENCOUNTER — Inpatient Hospital Stay (HOSPITAL_COMMUNITY)
Admission: EM | Admit: 2016-05-04 | Discharge: 2016-05-10 | DRG: 638 | Disposition: A | Discharge status: Home / Self Care | Payer: Medicaid Other | Attending: Family Medicine | Admitting: Family Medicine

## 2016-05-04 ENCOUNTER — Encounter (HOSPITAL_COMMUNITY): Payer: Self-pay | Admitting: Emergency Medicine

## 2016-05-04 ENCOUNTER — Inpatient Hospital Stay (HOSPITAL_COMMUNITY): Payer: Medicaid Other

## 2016-05-04 DIAGNOSIS — D72829 Elevated white blood cell count, unspecified: Secondary | ICD-10-CM | POA: Diagnosis present

## 2016-05-04 DIAGNOSIS — Z833 Family history of diabetes mellitus: Secondary | ICD-10-CM

## 2016-05-04 DIAGNOSIS — J45909 Unspecified asthma, uncomplicated: Secondary | ICD-10-CM | POA: Diagnosis present

## 2016-05-04 DIAGNOSIS — E860 Dehydration: Secondary | ICD-10-CM | POA: Diagnosis present

## 2016-05-04 DIAGNOSIS — E1069 Type 1 diabetes mellitus with other specified complication: Secondary | ICD-10-CM | POA: Diagnosis present

## 2016-05-04 DIAGNOSIS — I5032 Chronic diastolic (congestive) heart failure: Secondary | ICD-10-CM | POA: Diagnosis present

## 2016-05-04 DIAGNOSIS — Z9049 Acquired absence of other specified parts of digestive tract: Secondary | ICD-10-CM

## 2016-05-04 DIAGNOSIS — E1143 Type 2 diabetes mellitus with diabetic autonomic (poly)neuropathy: Secondary | ICD-10-CM | POA: Diagnosis not present

## 2016-05-04 DIAGNOSIS — D134 Benign neoplasm of liver: Secondary | ICD-10-CM | POA: Diagnosis present

## 2016-05-04 DIAGNOSIS — K760 Fatty (change of) liver, not elsewhere classified: Secondary | ICD-10-CM | POA: Diagnosis present

## 2016-05-04 DIAGNOSIS — K3184 Gastroparesis: Secondary | ICD-10-CM | POA: Diagnosis present

## 2016-05-04 DIAGNOSIS — Z9102 Food additives allergy status: Secondary | ICD-10-CM | POA: Diagnosis not present

## 2016-05-04 DIAGNOSIS — R112 Nausea with vomiting, unspecified: Secondary | ICD-10-CM | POA: Diagnosis not present

## 2016-05-04 DIAGNOSIS — E1010 Type 1 diabetes mellitus with ketoacidosis without coma: Secondary | ICD-10-CM | POA: Diagnosis present

## 2016-05-04 DIAGNOSIS — E1043 Type 1 diabetes mellitus with diabetic autonomic (poly)neuropathy: Secondary | ICD-10-CM | POA: Diagnosis present

## 2016-05-04 DIAGNOSIS — R1084 Generalized abdominal pain: Secondary | ICD-10-CM | POA: Diagnosis not present

## 2016-05-04 DIAGNOSIS — R339 Retention of urine, unspecified: Secondary | ICD-10-CM | POA: Diagnosis present

## 2016-05-04 DIAGNOSIS — R011 Cardiac murmur, unspecified: Secondary | ICD-10-CM | POA: Diagnosis present

## 2016-05-04 DIAGNOSIS — R109 Unspecified abdominal pain: Secondary | ICD-10-CM

## 2016-05-04 DIAGNOSIS — Z91010 Allergy to peanuts: Secondary | ICD-10-CM | POA: Diagnosis not present

## 2016-05-04 DIAGNOSIS — Z794 Long term (current) use of insulin: Secondary | ICD-10-CM | POA: Diagnosis not present

## 2016-05-04 DIAGNOSIS — E1065 Type 1 diabetes mellitus with hyperglycemia: Secondary | ICD-10-CM | POA: Diagnosis not present

## 2016-05-04 DIAGNOSIS — R0600 Dyspnea, unspecified: Secondary | ICD-10-CM

## 2016-05-04 DIAGNOSIS — F329 Major depressive disorder, single episode, unspecified: Secondary | ICD-10-CM | POA: Diagnosis present

## 2016-05-04 DIAGNOSIS — F1290 Cannabis use, unspecified, uncomplicated: Secondary | ICD-10-CM | POA: Diagnosis present

## 2016-05-04 DIAGNOSIS — Z79899 Other long term (current) drug therapy: Secondary | ICD-10-CM

## 2016-05-04 DIAGNOSIS — E876 Hypokalemia: Secondary | ICD-10-CM | POA: Diagnosis present

## 2016-05-04 DIAGNOSIS — N319 Neuromuscular dysfunction of bladder, unspecified: Secondary | ICD-10-CM | POA: Diagnosis present

## 2016-05-04 DIAGNOSIS — Z8249 Family history of ischemic heart disease and other diseases of the circulatory system: Secondary | ICD-10-CM

## 2016-05-04 DIAGNOSIS — Z888 Allergy status to other drugs, medicaments and biological substances status: Secondary | ICD-10-CM

## 2016-05-04 DIAGNOSIS — Z87440 Personal history of urinary (tract) infections: Secondary | ICD-10-CM

## 2016-05-04 DIAGNOSIS — R000 Tachycardia, unspecified: Secondary | ICD-10-CM | POA: Diagnosis not present

## 2016-05-04 DIAGNOSIS — M1990 Unspecified osteoarthritis, unspecified site: Secondary | ICD-10-CM | POA: Diagnosis present

## 2016-05-04 DIAGNOSIS — F418 Other specified anxiety disorders: Secondary | ICD-10-CM | POA: Diagnosis present

## 2016-05-04 DIAGNOSIS — K219 Gastro-esophageal reflux disease without esophagitis: Secondary | ICD-10-CM | POA: Diagnosis present

## 2016-05-04 DIAGNOSIS — Z8639 Personal history of other endocrine, nutritional and metabolic disease: Secondary | ICD-10-CM

## 2016-05-04 DIAGNOSIS — R338 Other retention of urine: Secondary | ICD-10-CM | POA: Diagnosis present

## 2016-05-04 DIAGNOSIS — I110 Hypertensive heart disease with heart failure: Secondary | ICD-10-CM | POA: Diagnosis present

## 2016-05-04 LAB — RAPID URINE DRUG SCREEN, HOSP PERFORMED
Amphetamines: NOT DETECTED
BENZODIAZEPINES: NOT DETECTED
Barbiturates: NOT DETECTED
COCAINE: NOT DETECTED
OPIATES: NOT DETECTED
Tetrahydrocannabinol: POSITIVE — AB

## 2016-05-04 LAB — URINALYSIS, ROUTINE W REFLEX MICROSCOPIC
BACTERIA UA: NONE SEEN
Bilirubin Urine: NEGATIVE
KETONES UR: 80 mg/dL — AB
NITRITE: NEGATIVE
PROTEIN: NEGATIVE mg/dL
Specific Gravity, Urine: 1.024 (ref 1.005–1.030)
pH: 5 (ref 5.0–8.0)

## 2016-05-04 LAB — BASIC METABOLIC PANEL
ANION GAP: 18 — AB (ref 5–15)
BUN: 14 mg/dL (ref 6–20)
CHLORIDE: 109 mmol/L (ref 101–111)
CO2: 13 mmol/L — ABNORMAL LOW (ref 22–32)
Calcium: 9.4 mg/dL (ref 8.9–10.3)
Creatinine, Ser: 0.75 mg/dL (ref 0.44–1.00)
GFR calc Af Amer: >60 mL/min (ref >60)
GFR calc non Af Amer: >60 mL/min (ref >60)
Glucose, Bld: 223 mg/dL — ABNORMAL HIGH (ref 65–99)
Potassium: 4.3 mmol/L (ref 3.5–5.1)
SODIUM: 140 mmol/L (ref 135–145)

## 2016-05-04 LAB — COMPREHENSIVE METABOLIC PANEL
ALBUMIN: 4.5 g/dL (ref 3.5–5.0)
ALK PHOS: 117 U/L (ref 38–126)
ALT: 18 U/L (ref 14–54)
ANION GAP: 14 (ref 5–15)
AST: 27 U/L (ref 15–41)
BILIRUBIN TOTAL: 1.2 mg/dL (ref 0.3–1.2)
BUN: 17 mg/dL (ref 6–20)
CHLORIDE: 103 mmol/L (ref 101–111)
CO2: 20 mmol/L — AB (ref 22–32)
Calcium: 9.4 mg/dL (ref 8.9–10.3)
Creatinine, Ser: 0.67 mg/dL (ref 0.44–1.00)
GFR calc Af Amer: >60 mL/min (ref >60)
GFR calc non Af Amer: >60 mL/min (ref >60)
GLUCOSE: 277 mg/dL — AB (ref 65–99)
POTASSIUM: 3.9 mmol/L (ref 3.5–5.1)
Sodium: 137 mmol/L (ref 135–145)
TOTAL PROTEIN: 8.5 g/dL — AB (ref 6.5–8.1)

## 2016-05-04 LAB — CBC
HEMATOCRIT: 39.6 % (ref 36.0–46.0)
HEMOGLOBIN: 13.4 g/dL (ref 12.0–15.0)
MCH: 30.4 pg (ref 26.0–34.0)
MCHC: 33.8 g/dL (ref 30.0–36.0)
MCV: 89.8 fL (ref 78.0–100.0)
Platelets: 446 10*3/uL — ABNORMAL HIGH (ref 150–400)
RBC: 4.41 MIL/uL (ref 3.87–5.11)
RDW: 13.1 % (ref 11.5–15.5)
WBC: 14.8 10*3/uL — ABNORMAL HIGH (ref 4.0–10.5)

## 2016-05-04 LAB — LIPASE, BLOOD: Lipase: 14 U/L (ref 11–51)

## 2016-05-04 LAB — I-STAT BETA HCG BLOOD, ED (MC, WL, AP ONLY)

## 2016-05-04 LAB — CBG MONITORING, ED
GLUCOSE-CAPILLARY: 156 mg/dL — AB (ref 65–99)
GLUCOSE-CAPILLARY: 329 mg/dL — AB (ref 65–99)
GLUCOSE-CAPILLARY: 274 mg/dL — AB (ref 65–99)
Glucose-Capillary: 280 mg/dL — ABNORMAL HIGH (ref 65–99)
Glucose-Capillary: 169 mg/dL — ABNORMAL HIGH (ref 65–99)
Glucose-Capillary: 245 mg/dL — ABNORMAL HIGH (ref 65–99)

## 2016-05-04 LAB — MAGNESIUM: MAGNESIUM: 1.8 mg/dL (ref 1.7–2.4)

## 2016-05-04 LAB — GLUCOSE, CAPILLARY
GLUCOSE-CAPILLARY: 128 mg/dL — AB (ref 65–99)
Glucose-Capillary: 133 mg/dL — ABNORMAL HIGH (ref 65–99)

## 2016-05-04 LAB — I-STAT CG4 LACTIC ACID, ED: Lactic Acid, Venous: 1.56 mmol/L (ref 0.5–1.9)

## 2016-05-04 LAB — BETA-HYDROXYBUTYRIC ACID: Beta-Hydroxybutyric Acid: 3.39 mmol/L — ABNORMAL HIGH (ref 0.05–0.27)

## 2016-05-04 MED ORDER — PROMETHAZINE HCL 25 MG/ML IJ SOLN
25.0000 mg | Freq: Once | INTRAMUSCULAR | Status: AC
Start: 2016-05-04 — End: 2016-05-04
  Administered 2016-05-04: 25 mg via INTRAVENOUS
  Filled 2016-05-04: qty 1

## 2016-05-04 MED ORDER — VITAMIN D (ERGOCALCIFEROL) 1.25 MG (50000 UNIT) PO CAPS
50000.0000 [IU] | ORAL_CAPSULE | ORAL | Status: DC
  Filled 2016-05-04: qty 1

## 2016-05-04 MED ORDER — ESCITALOPRAM OXALATE 10 MG PO TABS
5.0000 mg | ORAL_TABLET | Freq: Every day | ORAL | Status: DC
  Administered 2016-05-06 – 2016-05-10 (×4): 5 mg via ORAL
  Filled 2016-05-04 (×6): qty 1

## 2016-05-04 MED ORDER — PROCHLORPERAZINE EDISYLATE 5 MG/ML IJ SOLN
10.0000 mg | Freq: Once | INTRAMUSCULAR | Status: AC
Start: 2016-05-04 — End: 2016-05-04
  Administered 2016-05-04: 10 mg via INTRAMUSCULAR
  Filled 2016-05-04: qty 2

## 2016-05-04 MED ORDER — ENOXAPARIN SODIUM 40 MG/0.4ML ~~LOC~~ SOLN
40.0000 mg | SUBCUTANEOUS | Status: DC
  Administered 2016-05-04 – 2016-05-09 (×6): 40 mg via SUBCUTANEOUS
  Filled 2016-05-04 (×6): qty 0.4

## 2016-05-04 MED ORDER — POTASSIUM CHLORIDE 10 MEQ/100ML IV SOLN
10.0000 meq | INTRAVENOUS | Status: AC
  Administered 2016-05-04 (×3): 10 meq via INTRAVENOUS
  Filled 2016-05-04 (×3): qty 100

## 2016-05-04 MED ORDER — SODIUM CHLORIDE 0.9 % IV SOLN: 30.0000 meq | Freq: Once | INTRAVENOUS | Status: DC

## 2016-05-04 MED ORDER — SODIUM CHLORIDE 0.9 % IV SOLN
INTRAVENOUS | Status: DC
  Administered 2016-05-04: 5.6 [IU]/h via INTRAVENOUS
  Administered 2016-05-04: 2.7 [IU]/h via INTRAVENOUS
  Filled 2016-05-04: qty 2.5

## 2016-05-04 MED ORDER — INSULIN ASPART 100 UNIT/ML ~~LOC~~ SOLN: 0.0000 [IU] | SUBCUTANEOUS | Status: DC

## 2016-05-04 MED ORDER — INSULIN REGULAR BOLUS VIA INFUSION
0.0000 [IU] | Freq: Three times a day (TID) | INTRAVENOUS | Status: DC
  Filled 2016-05-04: qty 10

## 2016-05-04 MED ORDER — MONTELUKAST SODIUM 10 MG PO TABS
10.0000 mg | ORAL_TABLET | Freq: Every day | ORAL | Status: DC
  Administered 2016-05-06 – 2016-05-10 (×4): 10 mg via ORAL
  Filled 2016-05-04 (×4): qty 1

## 2016-05-04 MED ORDER — DEXTROSE-NACL 5-0.45 % IV SOLN
INTRAVENOUS | Status: DC
  Administered 2016-05-04 – 2016-05-05 (×3): via INTRAVENOUS

## 2016-05-04 MED ORDER — MOMETASONE FURO-FORMOTEROL FUM 200-5 MCG/ACT IN AERO
2.0000 | INHALATION_SPRAY | Freq: Two times a day (BID) | RESPIRATORY_TRACT | Status: DC
  Administered 2016-05-06: 2 via RESPIRATORY_TRACT
  Filled 2016-05-04: qty 8.8

## 2016-05-04 MED ORDER — DEXTROSE 50 % IV SOLN: 25.0000 mL | INTRAVENOUS | Status: DC | PRN

## 2016-05-04 MED ORDER — SODIUM CHLORIDE 0.9 % IV SOLN
INTRAVENOUS | Status: DC
  Administered 2016-05-04: 16:00:00 via INTRAVENOUS

## 2016-05-04 MED ORDER — ONDANSETRON HCL 4 MG/2ML IJ SOLN
4.0000 mg | Freq: Once | INTRAMUSCULAR | Status: AC
  Administered 2016-05-04: 4 mg via INTRAVENOUS
  Filled 2016-05-04: qty 2

## 2016-05-04 MED ORDER — PROMETHAZINE HCL 25 MG/ML IJ SOLN
25.0000 mg | Freq: Four times a day (QID) | INTRAMUSCULAR | Status: DC | PRN
  Administered 2016-05-04 – 2016-05-08 (×13): 25 mg via INTRAVENOUS
  Filled 2016-05-04 (×13): qty 1

## 2016-05-04 MED ORDER — DEXTROSE-NACL 5-0.45 % IV SOLN
INTRAVENOUS | Status: DC
  Administered 2016-05-04: 1000 mL via INTRAVENOUS

## 2016-05-04 MED ORDER — SODIUM CHLORIDE 0.9 % IV SOLN: INTRAVENOUS | Status: DC

## 2016-05-04 MED ORDER — SODIUM CHLORIDE 0.9 % IV SOLN
INTRAVENOUS | Status: AC
  Administered 2016-05-04 (×2): via INTRAVENOUS

## 2016-05-04 MED ORDER — FAMOTIDINE IN NACL 20-0.9 MG/50ML-% IV SOLN
20.0000 mg | Freq: Once | INTRAVENOUS | Status: AC
  Administered 2016-05-04: 20 mg via INTRAVENOUS
  Filled 2016-05-04: qty 50

## 2016-05-04 MED ORDER — SODIUM CHLORIDE 0.9 % IV BOLUS (SEPSIS)
1000.0000 mL | Freq: Once | INTRAVENOUS | Status: AC
  Administered 2016-05-04: 1000 mL via INTRAVENOUS

## 2016-05-04 NOTE — ED Triage Notes (Signed)
Pt reports she began to throw up at 0500 this am. Hx of DM and gastroparesis. Pt reports her CBG at home was fine. Pt actively vomiting in triage.

## 2016-05-04 NOTE — H&P (Signed)
History and Physical  [name] HQP:[id number] DOB: 28-Oct-1990 DOA: 05/04/2016   PCP: Vicenta Aly, FNP   Patient coming from: Home  Chief Complaint: n/v abdominal pain  HPI:  [name] is a 26 y.o. female with medical history of diabetes mellitus type 1, recent pancreatitis, liver adenomas, anxiety, hepatic steatosis, asthma presented with 1day history of N/V and abdominal pain.  She denied any fevers, chills, chest pain, short of breath, headache, neck pain, dysuria, hematuria. Notably, the patient was recently discharged from the hospital on 03/04/16 after treatment for DKA.  The patient endorses compliance with her insulin and all her other medications. She denies any recent sick contacts. She has been using cannabis, but denies any other illegal drug use.  In the emergency department, the patient was afebrile and hemodynamically stable with oxygen saturation 100% on room air. She was tachycardic with a heart rate 115-120.  Hepatic enzymes were unremarkable. BMP showed serum glucose of 277 with anion gap of 20. WBC was 14.8, hemoglobin 13.4, platelets 446,000.  Lactic acid was 1.56.  The patient has had numerous admissions in the past 6 months secondary to DKA as well as for N/V from presumptive gastroparesis. During her admissions, it has been characteristic for slow recovery due to slow recovering metabolic acidosis which is initially gapped and subsequently nongapped. In addition, her admissions have been characterized by profound volume depletion requiring significant fluid resuscitation which subsequently resulted in improvement in the patient's sinus tachycardia and metabolic acidosis. Finally, her previous admissions have been characterized by opioid dependence for her "abdominal pain" which has resulted in exacerbation of her presumed gastroparesis resulting in slow recovery of her nausea and vomiting. However, when the patient's opioids are decreased in dose and  frequency, the patient's vomiting improves. In addition when the patient's opioids are decreased, the patient has had a propensity to leave [place] in the past.  Assessment/Plan: DKA in DM type 1 -patient started on IV insulin with q 1 hour CBG check and q 4 hour BMPs -pt started on aggressive fluid resuscitation -Electrolytes were monitored and repleted -transitioned to [facility] insulin once anion gap is closed -she normally takes insulin glargine 25 units q hs, which she claims compliance -03/01/16 HbA1C--8.9  Nausea and vomiting/Abdominal pain -likely gastroparesis related to her DM  -11/02/2015 CT abdomen pelvis--stable hepatic masses, normal pancreas, normal bowel wall thickening or inflammation -04/21/2015 CT abdomen and pelvis--stable hepatic masses, normal pancreas, normal bowel wall thickening or inflammation, normal spleen and pancreas -09/23/2014 EGD--diffuse gastritis without clear explanation of the patient's hematemesis -Continue IV fluid, metoclopramide -minimize opioids -scheduled zofran q 6hrs -continue PPI  DM type I, uncontrolled -Continue fluid resuscitation --03/01/16 HbA1C--8.9  Asthma -No wheezing or stridor -Stable on room air -Continue LABA--pt refuses -continue singulair  Sinus tachycardia/Dehydration -Secondary to stress response from the patient's volume depletion, vomiting, and pain -As discussed, aggressive fluid resuscitation and conservative measures usually results in an improvement -Echocardiogram 05/24/2014 showed EF 59-93%, grade 1 diastolic dysfunction -57/01/77--LTJ--0.300  Dyspnea -Likely secondary to metabolic acidosis -Chest x-ray         Past Medical History:  Diagnosis Date  . Anxiety   . Arthritis   . Asthma   . Diabetes mellitus 2007   IDDM.  poorly controlled, multiple admits with DKA  . Gallstones   . Gastroparesis   . Heart murmur   . Hepatic steatosis 11/26/2014   and hepatomegaly  .  Hypertension    NOT CURRENTLY ON ANY BP  MED  . Liver mass 11/26/2014  . Pancreatitis, acute 11/26/2014   Past Surgical History:  Procedure Laterality Date  . CHOLECYSTECTOMY N/A 02/11/2015   Procedure: LAPAROSCOPIC CHOLECYSTECTOMY WITH INTRAOPERATIVE CHOLANGIOGRAM;  Surgeon: Greer Pickerel, MD;  Location: [facility];  Service: General;  Laterality: N/A;  . ESOPHAGOGASTRODUODENOSCOPY (EGD) WITH PROPOFOL Left 09/20/2014   Procedure: ESOPHAGOGASTRODUODENOSCOPY (EGD) WITH PROPOFOL;  Surgeon: Arta Silence, MD;  Location: [facility] ENDOSCOPY;  Service: Endoscopy;  Laterality: Left;  . WISDOM TOOTH EXTRACTION     Social History:  reports that she has never smoked. She has never used smokeless tobacco. She reports that she uses drugs, including Marijuana. She reports that she does not drink alcohol.   Family History  Problem Relation Age of Onset  . Heart disease Maternal Grandmother   . Heart disease Maternal Grandfather   . Diabetes Mother   . Hyperlipidemia Mother   . Hypertension Father   . Heart disease Father   . Hypertension Paternal Grandmother   . Cancer Paternal Grandfather      Allergies  Allergen Reactions  . Peanut-Containing Drug Products Swelling    Swelling of mouth, lips  . Strawberry Extract Swelling    Swelling of mouth, lips  . Ultram [Tramadol] Itching     Prior to Admission medications   Medication Sig Start Date End Date Taking? Authorizing Provider  albuterol (PROVENTIL HFA;VENTOLIN HFA) 108 (90 BASE) MCG/ACT inhaler Inhale 2 puffs into the lungs every 6 (six) hours as needed for shortness of breath.    Historical Provider, MD  albuterol (PROVENTIL) (2.5 MG/3ML) 0.083% nebulizer solution Take 2.5 mg by nebulization every 6 (six) hours as needed for shortness of breath.    Historical Provider, MD  budesonide-formoterol (SYMBICORT) 160-4.5 MCG/ACT inhaler Inhale 2 puffs into the lungs daily.     Historical Provider, MD  escitalopram (LEXAPRO) 10 MG tablet Take 0.5 tablets (5  mg total) by mouth daily. 01/19/16   Barton Dubois, MD  famotidine (PEPCID) 20 MG tablet Take 1 tablet (20 mg total) by mouth at bedtime. 02/29/16   Antonietta Breach, PA-C  fluconazole (DIFLUCAN) 100 MG tablet Take 1 tablet (100 mg total) by mouth daily. Patient not taking: Reported on 02/29/2016 02/25/16   Elmarie Shiley, MD  Insulin Glargine (BASAGLAR KWIKPEN) 100 UNIT/ML SOPN Inject 0.25 mLs (25 Units total) into the skin at bedtime. 02/25/16   Belkys A Regalado, MD  magnesium oxide (MAG-OX) 400 MG tablet Take 2 tablets (800 mg total) by mouth daily. 03/04/16   Nita Sells, MD  metoCLOPramide (REGLAN) 10 MG tablet Take 1 tablet (10 mg total) by mouth 3 (three) times daily before meals. 02/29/16   Antonietta Breach, PA-C  montelukast (SINGULAIR) 10 MG tablet Take 10 mg by mouth daily.    Historical Provider, MD  NOVOLOG FLEXPEN 100 UNIT/ML FlexPen 1 UNIT FOR EVERY 10 GRAMS CARB W/MEALS/SNACKS PLUS SLIDING SCALE: 1 UNIT GLUCOSE 50 ABOVE 150.TDD 20 12/26/15   Historical Provider, MD  pantoprazole (PROTONIX) 40 MG tablet Take 1 tablet (40 mg total) by mouth 2 (two) times daily. 02/25/16   Belkys A Regalado, MD  potassium chloride SA (K-DUR,KLOR-CON) 20 MEQ tablet Take 2 tablets (40 mEq total) by mouth daily. 03/04/16   Nita Sells, MD  Vitamin D, Ergocalciferol, (DRISDOL) 50000 units CAPS capsule TAKE ONE CAPSULE (50,000 UNITS TOTAL) BY MOUTH ONCE A WEEK. Saturday 12/27/15   Historical Provider, MD    Review of Systems:  Constitutional:  No weight loss, night sweats, Fevers, chills, fatigue.  Head&Eyes: No headache.  No vision loss.  No eye pain or scotoma ENT:  No Difficulty swallowing,Tooth/dental problems,Sore throat,  No ear ache, post nasal drip,  Cardio-vascular:  No chest pain, Orthopnea, PND, swelling in lower extremities,  dizziness, palpitations  GI:  No  diarrhea, loss of appetite, hematochezia, melena, heartburn, indigestion, Resp:   No cough. No coughing up of blood .No wheezing.No  chest wall deformity  Skin:  no rash or lesions.  GU:  no dysuria, change in color of urine, no urgency or frequency. No flank pain.  Musculoskeletal:  No joint pain or swelling. No decreased range of motion. No back pain.  Psych:  No change in mood or affect. No depression or anxiety. Neurologic: No headache, no dysesthesia, no focal weakness, no vision loss. No syncope  Physical Exam: Vitals:   05/04/16 1037 05/04/16 1506  BP: (!) 152/108 (!) 149/105  Pulse: (!) 105 (!) 117  Resp: 18 16  Temp: 97.3 F (36.3 C)   TempSrc: Oral   SpO2: 96% 100%   General:  A&O x 3, NAD, nontoxic, pleasant/cooperative Head/Eye: No conjunctival hemorrhage, no icterus, [place]/AT, No nystagmus ENT:  No icterus,  No thrush, good dentition, no pharyngeal exudate Neck:  No masses, no lymphadenpathy, no bruits CV:  RRR, no rub, no gallop, no S3 Lung:  CTAB, good air movement, no wheeze, no rhonchi Abdomen: soft/NT, +BS, nondistended, no peritoneal signs Ext: No cyanosis, No rashes, No petechiae, No lymphangitis, No edema Neuro: CNII-XII intact, strength 4/5 in bilateral upper and lower extremities, no dysmetria  Labs on Admission:  Basic Metabolic Panel:  Recent Labs Lab 05/04/16 1247  NA 137  K 3.9  CL 103  CO2 20*  GLUCOSE 277*  BUN 17  CREATININE 0.67  CALCIUM 9.4   Liver Function Tests:  Recent Labs Lab 05/04/16 1247  AST 27  ALT 18  ALKPHOS 117  BILITOT 1.2  PROT 8.5*  ALBUMIN 4.5    Recent Labs Lab 05/04/16 1247  LIPASE 14   No results for input(s): AMMONIA in the last 168 hours. CBC:  Recent Labs Lab 05/04/16 1247  WBC 14.8*  HGB 13.4  HCT 39.6  MCV 89.8  PLT 446*   Coagulation Profile: No results for input(s): INR, PROTIME in the last 168 hours. Cardiac Enzymes: No results for input(s): CKTOTAL, CKMB, CKMBINDEX, TROPONINI in the last 168 hours. BNP: Invalid input(s): POCBNP CBG:  Recent Labs Lab 05/04/16 1100 05/04/16 1457  GLUCAP 156* 329*    Urine analysis:    Component Value Date/Time   COLORURINE STRAW (A) 02/29/2016 [place] 02/29/2016 1645   LABSPEC 1.015 02/29/2016 1645   PHURINE 6.0 02/29/2016 1645   GLUCOSEU >=500 (A) 02/29/2016 1645   GLUCOSEU >=1000 11/07/2012 1205   HGBUR SMALL (A) 02/29/2016 [place] 02/29/2016 1645   KETONESUR 80 (A) 02/29/2016 1645   PROTEINUR NEGATIVE 02/29/2016 1645   UROBILINOGEN 0.2 11/24/2014 1045   NITRITE NEGATIVE 02/29/2016 1645   LEUKOCYTESUR MODERATE (A) 02/29/2016 1645   Sepsis Labs: @LABRCNTIP (procalcitonin:4,lacticidven:4) )No results found for this or any previous visit (from the past 240 hour(s)).   Radiological Exams on Admission: No results found.      Time spent:60 minutes Code Status:   FULL Family Communication:  No Family at bedside Disposition Plan: expect 2-3 day hospitalization Consults called: none DVT Prophylaxis: [place] Lovenox  Antar Milks, DO  [facility] Pager [phone]  If 7PM-7AM, please contact night-coverage [email or web address] Password [facility] 05/04/2016,  3:23 PM

## 2016-05-04 NOTE — Progress Notes (Signed)
   05/04/16 1200  Clinical Encounter Type  Visited With Patient  Visit Type Follow-up;Psychological support;Spiritual support;ED  Referral From Patient  Consult/Referral To Chaplain  Spiritual Encounters  Spiritual Needs Emotional;Other (Comment) (Pastoral Conversation/Support)  Stress Factors  Patient Stress Factors Health changes   I visited with the patient as a follow-up from previous visits during her previous admissions. The patient stated that she was feeling nauseous and began to throw up.  When asked what support she needed from me, she stated that she did not need anything at this time.   Please, contact Spiritual Care for further assistance.   [place] M.Div.

## 2016-05-04 NOTE — ED Notes (Signed)
2 unsuccessful IV start attempts by this RN.

## 2016-05-04 NOTE — ED Provider Notes (Signed)
[place] DEPT Provider Note   CSN: [id number] Arrival date & time: 05/04/16  1034     History   Chief Complaint Chief Complaint  Patient presents with  . Emesis    HPI [name] is a 26 y.o. female.  HPI [name] is a 26 y.o. female with history of poorly controlled type 1 diabetes, gastroparesis, hypertension, hepatic steatosis, pancreatitis, presents to emergency department complaining nausea and vomiting and complaining of abdominal pain. Patient states her symptoms began early this morning. She reports numerous episodes of nausea and vomiting. Reports upper abdominal pain. Reports generalized weakness. States she took Zofran at home which did not help. Patient reports compliance with all of her medications including her insulin. She has history of frequent admissions for gastroparesis and DKA. Pt denies fever or chills. Nothing making her symptoms better or worse. Denies any other complaints.   Past Medical History:  Diagnosis Date  . Anxiety   . Arthritis   . Asthma   . Diabetes mellitus 2007   IDDM.  poorly controlled, multiple admits with DKA  . Gallstones   . Gastroparesis   . Heart murmur   . Hepatic steatosis 11/26/2014   and hepatomegaly  . Hypertension    NOT CURRENTLY ON ANY BP MED  . Liver mass 11/26/2014  . Pancreatitis, acute 11/26/2014    Patient Active Problem List   Diagnosis Date Noted  . DKA, type 1, not at goal [facility]) 02/29/2016  . UTI (urinary tract infection) 01/22/2016  . Depression   . Diarrhea 11/09/2015  . Urinary retention   . Gastroparesis due to DM ([place]) 07/10/2015  . GERD (gastroesophageal reflux disease) 07/10/2015  . Depression with anxiety 07/10/2015  . Uncontrolled type 1 diabetes mellitus with hyperglycemia ([place])   . Gastroparesis 06/22/2015  . Altered mental status 06/22/2015  . Volume depletion 06/10/2015  . Protein-calorie malnutrition, severe 06/10/2015  . Hyperglycemia   . Intractable vomiting with  nausea   . Elevated bilirubin   . Type 1 diabetes mellitus with hyperglycemia ([place]) 05/29/2015  . Hematemesis with nausea   . Intractable vomiting 05/28/2015  . Intractable nausea and vomiting 05/28/2015  . Abdominal pain in female   . Abdominal pain 05/24/2015  . Hypertension 05/24/2015  . Dehydration   . Diabetic gastroparesis ([place])   . Chronic diastolic heart failure ([place]) 04/11/2015  . Hematemesis 04/08/2015  . DKA (diabetic ketoacidoses) ([place]) 03/22/2015  . Nausea vomiting and diarrhea 03/22/2015  . Nausea & vomiting   . Diabetic ketoacidosis without coma associated with type 1 diabetes mellitus ([place])   . S/P laparoscopic cholecystectomy 02/11/2015  . Status post laparoscopic cholecystectomy 02/11/2015  . Postextubation stridor   . Nausea and vomiting 11/27/2014  . Pancreatitis, acute 11/26/2014  . Volume overload 11/26/2014  . Hypokalemia 11/26/2014  . Hepatic steatosis 11/26/2014  . Liver mass 11/26/2014  . Sepsis ([place]) 11/25/2014  . Sinus tachycardia 11/25/2014  . Hypomagnesemia 11/25/2014  . Hypophosphatemia 11/25/2014  . Elevated amylase and lipase 11/25/2014  . DKA, type 1 ([place]) 11/24/2014  . Elevated LFTs 11/24/2014  . Acute kidney injury ([place]) 11/24/2014  . Migraine headache 11/24/2014  . Asthma 06/29/2012  . Uncontrolled type 1 diabetes mellitus ([place]) 06/19/2010  . Goiter, unspecified 06/19/2010    Past Surgical History:  Procedure Laterality Date  . CHOLECYSTECTOMY N/A 02/11/2015   Procedure: LAPAROSCOPIC CHOLECYSTECTOMY WITH INTRAOPERATIVE CHOLANGIOGRAM;  Surgeon: Greer Pickerel, MD;  Location: [facility];  Service: General;  Laterality: N/A;  . ESOPHAGOGASTRODUODENOSCOPY (EGD) WITH PROPOFOL Left  09/20/2014   Procedure: ESOPHAGOGASTRODUODENOSCOPY (EGD) WITH PROPOFOL;  Surgeon: Arta Silence, MD;  Location[facility] ENDOSCOPY;  Service: Endoscopy;  Laterality: Left;  . WISDOM TOOTH EXTRACTION      OB History    Gravida Para Term Preterm AB Living   2 1 0 1 1 1     SAB TAB Ectopic Multiple Live Births   0 1 0 0 1       Home Medications    Prior to Admission medications   Medication Sig Start Date End Date Taking? Authorizing Provider  albuterol (PROVENTIL HFA;VENTOLIN HFA) 108 (90 BASE) MCG/ACT inhaler Inhale 2 puffs into the lungs every 6 (six) hours as needed for shortness of breath.    Historical Provider, MD  albuterol (PROVENTIL) (2.5 MG/3ML) 0.083% nebulizer solution Take 2.5 mg by nebulization every 6 (six) hours as needed for shortness of breath.    Historical Provider, MD  budesonide-formoterol (SYMBICORT) 160-4.5 MCG/ACT inhaler Inhale 2 puffs into the lungs daily.     Historical Provider, MD  escitalopram (LEXAPRO) 10 MG tablet Take 0.5 tablets (5 mg total) by mouth daily. 01/19/16   Barton Dubois, MD  famotidine (PEPCID) 20 MG tablet Take 1 tablet (20 mg total) by mouth at bedtime. 02/29/16   Antonietta Breach, PA-C  fluconazole (DIFLUCAN) 100 MG tablet Take 1 tablet (100 mg total) by mouth daily. Patient not taking: Reported on 02/29/2016 02/25/16   Elmarie Shiley, MD  Insulin Glargine (BASAGLAR KWIKPEN) 100 UNIT/ML SOPN Inject 0.25 mLs (25 Units total) into the skin at bedtime. 02/25/16   Belkys A Regalado, MD  magnesium oxide (MAG-OX) 400 MG tablet Take 2 tablets (800 mg total) by mouth daily. 03/04/16   Nita Sells, MD  metoCLOPramide (REGLAN) 10 MG tablet Take 1 tablet (10 mg total) by mouth 3 (three) times daily before meals. 02/29/16   Antonietta Breach, PA-C  montelukast (SINGULAIR) 10 MG tablet Take 10 mg by mouth daily.    Historical Provider, MD  NOVOLOG FLEXPEN 100 UNIT/ML FlexPen 1 UNIT FOR EVERY 10 GRAMS CARB W/MEALS/SNACKS PLUS SLIDING SCALE: 1 UNIT GLUCOSE 50 ABOVE 150.TDD 20 12/26/15   Historical Provider, MD  pantoprazole (PROTONIX) 40 MG tablet Take 1 tablet (40 mg total) by mouth 2 (two) times daily. 02/25/16   Belkys A Regalado, MD  potassium chloride SA (K-DUR,KLOR-CON) 20 MEQ tablet Take 2 tablets (40 mEq total) by mouth daily.  03/04/16   Nita Sells, MD  Vitamin D, Ergocalciferol, (DRISDOL) 50000 units CAPS capsule TAKE ONE CAPSULE (50,000 UNITS TOTAL) BY MOUTH ONCE A WEEK. Saturday 12/27/15   Historical Provider, MD    Family History Family History  Problem Relation Age of Onset  . Heart disease Maternal Grandmother   . Heart disease Maternal Grandfather   . Diabetes Mother   . Hyperlipidemia Mother   . Hypertension Father   . Heart disease Father   . Hypertension Paternal Grandmother   . Cancer Paternal Grandfather     Social History Social History  Substance Use Topics  . Smoking status: Never Smoker  . Smokeless tobacco: Never Used  . Alcohol use No     Allergies   Peanut-containing drug products; Strawberry extract; and Ultram [tramadol]   Review of Systems Review of Systems  Constitutional: Positive for fatigue. Negative for chills and fever.  Respiratory: Negative for cough, chest tightness and shortness of breath.   Cardiovascular: Negative for chest pain, palpitations and leg swelling.  Gastrointestinal: Positive for abdominal pain, nausea and vomiting. Negative for diarrhea.  Genitourinary: Negative for dysuria, flank pain, pelvic pain, vaginal bleeding, vaginal discharge and vaginal pain.  Musculoskeletal: Negative for arthralgias, myalgias, neck pain and neck stiffness.  Skin: Negative for rash.  Neurological: Positive for weakness. Negative for dizziness and headaches.  All other systems reviewed and are negative.    Physical Exam Updated Vital Signs BP (!) 149/105 (BP Location: Right Arm)   Pulse (!) 117   Temp 97.3 F (36.3 C) (Oral)   Resp 16   LMP 04/19/2016   SpO2 100%   Physical Exam  Constitutional: She is oriented to person, place, and time. She appears well-developed and well-nourished.  Actively vomiting  HENT:  Head: Normocephalic.  Eyes: Conjunctivae are normal.  Neck: Neck supple.  Cardiovascular: Regular rhythm and normal heart sounds.     tachycardic  Pulmonary/Chest: Effort normal and breath sounds normal. No respiratory distress. She has no wheezes. She has no rales.  Abdominal: Soft. Bowel sounds are normal. She exhibits no distension. There is tenderness. There is no rebound.  Diffuse tenderness  Musculoskeletal: She exhibits no edema.  Neurological: She is alert and oriented to person, place, and time.  Skin: Skin is warm and dry.  Psychiatric: She has a normal mood and affect. Her behavior is normal.  Nursing note and vitals reviewed.    ED Treatments / Results  Labs (all labs ordered are listed, but only abnormal results are displayed) Labs Reviewed  COMPREHENSIVE METABOLIC PANEL - Abnormal; Notable for the following:       Result Value   CO2 20 (*)    Glucose, Bld 277 (*)    Total Protein 8.5 (*)    All other components within normal limits  CBC - Abnormal; Notable for the following:    WBC 14.8 (*)    Platelets 446 (*)    All other components within normal limits  BETA-HYDROXYBUTYRIC ACID - Abnormal; Notable for the following:    Beta-Hydroxybutyric Acid 3.39 (*)    All other components within normal limits  CBG MONITORING, ED - Abnormal; Notable for the following:    Glucose-Capillary 156 (*)    All other components within normal limits  CBG MONITORING, ED - Abnormal; Notable for the following:    Glucose-Capillary 329 (*)    All other components within normal limits  LIPASE, BLOOD  URINALYSIS, ROUTINE W REFLEX MICROSCOPIC  BLOOD GAS, VENOUS  OCCULT BLOOD GASTRIC / DUODENUM (SPECIMEN CUP)  I-STAT BETA HCG BLOOD, ED ([facility], [facility], [facility] ONLY)  I-STAT CG4 LACTIC ACID, ED  I-STAT CG4 LACTIC ACID, ED    EKG  EKG Interpretation None       Radiology No results found.  Procedures Procedures (including critical care time)  Medications Ordered in ED Medications  sodium chloride 0.9 % bolus 1,000 mL (not administered)  dextrose 5 %-0.45 % sodium chloride infusion (not administered)  insulin  regular bolus via infusion 0-10 Units (not administered)  insulin regular (NOVOLIN R,HUMULIN R) 250 Units in sodium chloride 0.9 % 250 mL (1 Units/mL) infusion (not administered)  dextrose 50 % solution 25 mL (not administered)  0.9 %  sodium chloride infusion (not administered)  ondansetron (ZOFRAN) injection 4 mg (not administered)  sodium chloride 0.9 % bolus 1,000 mL (1,000 mLs Intravenous New Bag/Given 05/04/16 1244)  promethazine (PHENERGAN) injection 25 mg (25 mg Intravenous Given 05/04/16 1244)  famotidine (PEPCID) IVPB 20 mg premix (20 mg Intravenous New Bag/Given 05/04/16 1244)  prochlorperazine (COMPAZINE) injection 10 mg (10 mg Intramuscular Given 05/04/16 1420)  Initial Impression / Assessment and Plan / ED Course  I have reviewed the triage vital signs and the nursing notes.  Pertinent labs & imaging results that were available during my care of the patient were reviewed by me and considered in my medical decision making (see chart for details).     Patient in emergency department with nausea, vomiting, abdominal pain, weakness. Onset of symptoms this morning. Frequent admissions for DKA and gastroparesis. Will check labs, including urinalysis, VBG, blood ketones. Will start IV fluids.   Patient already got 25 mg Phenergan IV with IV stopped working. Delay in obtaining another IV due to poor vascular access. IV nurse consult pending. Patient was given Compazine 10 mg IM for persistent nausea and vomiting. Patient did ask for pain medications, advised that we will work on getting her nausea and vomiting under better control and will give her Pepcid once she gets her IV.  3:13 PM Patient continues to have nausea and vomiting, Ativan and Zofran. She now has an IV and has fluids running. I will start her on glucose stabilizer. Patient's glucose is 277, her bicarbonate is 20, her serum ketones are elevated. White blood cell count is 14.8. Patient unable to provide [place] any urine at  this time. She is vomiting dark emesis, I will get gastro-occult of the emesis. Her hemoglobin is normal at this time.   Will call for admission.   3:21 PM Spoke with hospitalist, will admit.  Pt continues to be tachycardic.   Vitals:   05/04/16 1037 05/04/16 1506  BP: (!) 152/108 (!) 149/105  Pulse: (!) 105 (!) 117  Resp: 18 16  Temp: 97.3 F (36.3 C)   TempSrc: Oral   SpO2: 96% 100%     Final Clinical Impressions(s) / ED Diagnoses   Final diagnoses:  Diabetic ketoacidosis without coma associated with type 1 diabetes mellitus [facility])    New Prescriptions New Prescriptions   No medications on file     Jeannett Senior, PA-C 05/04/16 [place], MD 05/05/16 1640

## 2016-05-05 LAB — MRSA PCR SCREENING: MRSA by PCR: NEGATIVE

## 2016-05-05 LAB — BASIC METABOLIC PANEL
Anion gap: 12 (ref 5–15)
Anion gap: 10 (ref 5–15)
BUN: 10 mg/dL (ref 6–20)
BUN: 9 mg/dL (ref 6–20)
CALCIUM: 8.6 mg/dL — AB (ref 8.9–10.3)
CALCIUM: 8.9 mg/dL (ref 8.9–10.3)
CO2: 14 mmol/L — ABNORMAL LOW (ref 22–32)
CO2: 17 mmol/L — ABNORMAL LOW (ref 22–32)
Chloride: 111 mmol/L (ref 101–111)
Chloride: 109 mmol/L (ref 101–111)
Creatinine, Ser: 0.52 mg/dL (ref 0.44–1.00)
Creatinine, Ser: 0.44 mg/dL (ref 0.44–1.00)
GFR calc Af Amer: >60 mL/min (ref >60)
GFR calc non Af Amer: >60 mL/min (ref >60)
Glucose, Bld: 215 mg/dL — ABNORMAL HIGH (ref 65–99)
Glucose, Bld: 144 mg/dL — ABNORMAL HIGH (ref 65–99)
Potassium: 4.1 mmol/L (ref 3.5–5.1)
Potassium: 3.5 mmol/L (ref 3.5–5.1)
SODIUM: 137 mmol/L (ref 135–145)
SODIUM: 136 mmol/L (ref 135–145)

## 2016-05-05 LAB — GLUCOSE, CAPILLARY
GLUCOSE-CAPILLARY: 302 mg/dL — AB (ref 65–99)
GLUCOSE-CAPILLARY: 197 mg/dL — AB (ref 65–99)
GLUCOSE-CAPILLARY: 317 mg/dL — AB (ref 65–99)
Glucose-Capillary: 163 mg/dL — ABNORMAL HIGH (ref 65–99)
Glucose-Capillary: 165 mg/dL — ABNORMAL HIGH (ref 65–99)
Glucose-Capillary: 183 mg/dL — ABNORMAL HIGH (ref 65–99)
Glucose-Capillary: 185 mg/dL — ABNORMAL HIGH (ref 65–99)

## 2016-05-05 LAB — HIV ANTIBODY (ROUTINE TESTING W REFLEX): HIV Screen 4th Generation wRfx: NONREACTIVE

## 2016-05-05 MED ORDER — FENTANYL CITRATE (PF) 100 MCG/2ML IJ SOLN
12.5000 ug | INTRAMUSCULAR | Status: DC | PRN
  Administered 2016-05-05 – 2016-05-07 (×8): 12.5 ug via INTRAVENOUS
  Filled 2016-05-05 (×9): qty 2

## 2016-05-05 MED ORDER — INSULIN GLARGINE 100 UNIT/ML ~~LOC~~ SOLN
12.0000 [IU] | Freq: Every day | SUBCUTANEOUS | Status: DC
  Administered 2016-05-05: 12 [IU] via SUBCUTANEOUS
  Filled 2016-05-05 (×2): qty 0.12

## 2016-05-05 MED ORDER — INSULIN ASPART 100 UNIT/ML ~~LOC~~ SOLN
0.0000 [IU] | Freq: Three times a day (TID) | SUBCUTANEOUS | Status: DC
  Administered 2016-05-05: 3 [IU] via SUBCUTANEOUS

## 2016-05-05 MED ORDER — INSULIN ASPART 100 UNIT/ML ~~LOC~~ SOLN
0.0000 [IU] | Freq: Every day | SUBCUTANEOUS | Status: DC
Start: 2016-05-05 — End: 2016-05-06
  Administered 2016-05-05: 4 [IU] via SUBCUTANEOUS

## 2016-05-05 MED ORDER — SODIUM CHLORIDE 0.9 % IV SOLN
INTRAVENOUS | Status: DC
  Administered 2016-05-05 – 2016-05-06 (×2): via INTRAVENOUS
  Filled 2016-05-05 (×4): qty 1000

## 2016-05-05 MED ORDER — METOCLOPRAMIDE HCL 5 MG/ML IJ SOLN
5.0000 mg | Freq: Three times a day (TID) | INTRAMUSCULAR | Status: DC
  Administered 2016-05-05 – 2016-05-09 (×19): 5 mg via INTRAVENOUS
  Filled 2016-05-05 (×19): qty 2

## 2016-05-05 NOTE — Progress Notes (Signed)
PROGRESS NOTE  [name] XMI:[id number] DOB: Sep 17, 1990 DOA: 05/04/2016 PCP: Vicenta Aly, FNP  Brief History:   26 y.o. female with medical history of diabetes mellitus type 1, recent pancreatitis, liver adenomas, anxiety, hepatic steatosis, asthma presented with 1day history of N/V and abdominal pain.  She denied any fevers, chills, chest pain, short of breath, headache, neck pain, dysuria, hematuria. Notably, the patient was recently discharged from the hospital on 03/04/16 after treatment for DKA.  The patient endorses compliance with her insulin and all her other medications.   She was found to be in DKA again and started on IVF and IV insulin. The patient has had numerous admissions in the past 9 months secondary to DKA as well as for N/V from presumptive gastroparesis. During her admissions, she has been characteristic for slow recovery due to slow recovering metabolic acidosis which is initially gapped and subsequently nongapped. In addition, her admissions have been characterized by profound volume depletion requiring significant fluid resuscitation which subsequently resulted in improvement in the patient's sinus tachycardia and metabolic acidosis. Finally, her previous admissions have been characterized by opioid dependence for her "abdominal pain" which has resulted in exacerbation of her presumed gastroparesis resulting in slow recovery of her nausea and vomiting. However, when the patient's opioids are decreased in dose and frequency, the patient's vomiting improves. In addition when the patient's opioids are decreased, the patient has had a propensity to leave [place] in the past  Assessment/Plan: DKA in DM type 1 -patient started on IV insulin with q 1 hour CBG check and q 4 hour BMPs -pt started on aggressive fluid resuscitation -Electrolytes were monitored and repleted -transitioned to [facility] insulin once anion gap is closed -she normally takes  insulin glargine 25 units q hs, which she claims compliance -03/01/16 HbA1C--8.9 -05/05/16--transition to lower dose Lantus  Nausea and vomiting/Abdominal pain -likely gastroparesis related to her DM  -11/02/2015 CT abdomen pelvis--stable hepatic masses, normal pancreas, normal bowel wall thickening or inflammation -04/21/2015 CT abdomen and pelvis--stable hepatic masses, normal pancreas, normal bowel wall thickening or inflammation, normal spleen and pancreas -09/23/2014 EGD--diffuse gastritis without clear explanation of the patient's hematemesis -Continue IV fluid, metoclopramide -minimize opioids -scheduled zofran q 6hrs -continue PPI  DM type I, uncontrolled -Continue fluid resuscitation --03/01/16 HbA1C--8.9  Asthma -No wheezing or stridor -Stable on room air -Continue LABA--pt refuses -continue singulair  Sinus tachycardia/Dehydration -Secondary to stress response from the patient's volume depletion, vomiting, and pain -As discussed, aggressive fluid resuscitation and conservative measures usually results in an improvement -Echocardiogram 05/24/2014 showed EF 82-50%, grade 1 diastolic dysfunction -03/70/48--GQB--1.694  Dyspnea -Likely secondary to metabolic acidosis -Chest x-ray--neg    Disposition Plan:   Home in 1-2 days  Family Communication:   No Family at bedside  Consultants:  none  Code Status:  FULL   DVT Prophylaxis:  [place] Lovenox   Procedures: As Listed in Progress Note Above  Antibiotics: None    Subjective: The patient continues to complain of nausea and vomiting. She denies any fevers, chills, chest pain, short spell, headache, neck pain. She complains of abdominal pain. Denies dysuria or hematuria. No hematemesis or hematochezia or melena  Objective: Vitals:   05/05/16 1146 05/05/16 1155 05/05/16 1200 05/05/16 1522  BP:  (!) 165/103    Pulse:  (!) 113 (!) 112   Resp:  19 20   Temp: 99 F (37.2 C)   98.7 F (37.1 C)  TempSrc:  Oral  Oral  SpO2:  100% 100%   Weight:      Height:        Intake/Output Summary (Last 24 hours) at 05/05/16 1625 Last data filed at 05/05/16 1300  Gross per 24 hour  Intake          5106.88 ml  Output              300 ml  Net          4806.88 ml   Weight change:  Exam:   General:  Pt is alert, follows commands appropriately, not in acute distress  HEENT: No icterus, No thrush, No neck mass, [place]/AT  Cardiovascular: RRR, S1/S2, no rubs, no gallops  Respiratory: CTA bilaterally, no wheezing, no crackles, no rhonchi  Abdomen: Soft/+BS, non tender, non distended, no guarding  Extremities: No edema, No lymphangitis, No petechiae, No rashes, no synovitis   Data Reviewed: I have personally reviewed following labs and imaging studies Basic Metabolic Panel:  Recent Labs Lab 05/04/16 1247 05/04/16 2208 05/05/16 0153 05/05/16 0702  NA 137 140 137 136  K 3.9 4.3 4.1 3.5  CL 103 109 111 109  CO2 20* 13* 14* 17*  GLUCOSE 277* 223* 215* 144*  BUN 17 14 10 9   CREATININE 0.67 0.75 0.52 0.44  CALCIUM 9.4 9.4 8.6* 8.9  MG  --  1.8  --   --    Liver Function Tests:  Recent Labs Lab 05/04/16 1247  AST 27  ALT 18  ALKPHOS 117  BILITOT 1.2  PROT 8.5*  ALBUMIN 4.5    Recent Labs Lab 05/04/16 1247  LIPASE 14   No results for input(s): AMMONIA in the last 168 hours. Coagulation Profile: No results for input(s): INR, PROTIME in the last 168 hours. CBC:  Recent Labs Lab 05/04/16 1247  WBC 14.8*  HGB 13.4  HCT 39.6  MCV 89.8  PLT 446*   Cardiac Enzymes: No results for input(s): CKTOTAL, CKMB, CKMBINDEX, TROPONINI in the last 168 hours. BNP: Invalid input(s): POCBNP CBG:  Recent Labs Lab 05/05/16 0047 05/05/16 0147 05/05/16 0253 05/05/16 0350 05/05/16 0454  GLUCAP 163* 197* 165* 185* 183*   HbA1C: No results for input(s): HGBA1C in the last 72 hours. Urine analysis:    Component Value Date/Time   COLORURINE YELLOW 05/04/2016 1745   APPEARANCEUR  CLOUDY (A) 05/04/2016 1745   LABSPEC 1.024 05/04/2016 1745   PHURINE 5.0 05/04/2016 1745   GLUCOSEU >=500 (A) 05/04/2016 1745   GLUCOSEU >=1000 11/07/2012 1205   HGBUR SMALL (A) 05/04/2016 1745   BILIRUBINUR NEGATIVE 05/04/2016 1745   KETONESUR 80 (A) 05/04/2016 1745   PROTEINUR NEGATIVE 05/04/2016 1745   UROBILINOGEN 0.2 11/24/2014 1045   NITRITE NEGATIVE 05/04/2016 1745   LEUKOCYTESUR SMALL (A) 05/04/2016 1745   Sepsis Labs: @LABRCNTIP (procalcitonin:4,lacticidven:4) ) Recent Results (from the past 240 hour(s))  MRSA PCR Screening     Status: None   Collection Time: 05/04/16 11:00 PM  Result Value Ref Range Status   MRSA by PCR NEGATIVE NEGATIVE Final    Comment:        The GeneXpert MRSA Assay (FDA approved for NASAL specimens only), is one component of a comprehensive MRSA colonization surveillance program. It is not intended to diagnose MRSA infection nor to guide or monitor treatment for MRSA infections.      Scheduled Meds: . enoxaparin (LOVENOX) injection  40 mg Subcutaneous Q24H  . escitalopram  5 mg Oral Daily  . insulin aspart  0-5 Units Subcutaneous  QHS  . insulin aspart  0-9 Units Subcutaneous TID WC  . insulin glargine  12 Units Subcutaneous Daily  . metoCLOPramide (REGLAN) injection  5 mg Intravenous TID AC & HS  . mometasone-formoterol  2 puff Inhalation BID  . montelukast  10 mg Oral Daily  . Vitamin D (Ergocalciferol)  50,000 Units Oral Q7 days   Continuous Infusions: . sodium chloride 0.9 % 1,000 mL with potassium chloride 20 mEq infusion 125 mL/hr at 05/05/16 1623    Procedures/Studies: Dg Chest 2 View  Result Date: 05/04/2016 CLINICAL DATA:  Acute onset of nausea, vomiting and generalized abdominal tenderness. Dark emesis. Severe lethargy. Initial encounter. EXAM: CHEST  2 VIEW COMPARISON:  Chest radiograph from 02/29/2016 FINDINGS: The lungs are well-aerated. Pulmonary vascularity is at the upper limits of normal. There is no evidence of focal  opacification, pleural effusion or pneumothorax. The heart is normal in size; the mediastinal contour is within normal limits. No acute osseous abnormalities are seen. IMPRESSION: No acute cardiopulmonary process seen. Electronically Signed   By: Garald Balding M.D.   On: 05/04/2016 23:32    Kawan Valladolid, DO  [facility] Pager [phone]  If 7PM-7AM, please contact night-coverage [email or web address] Password [email or web address] 05/05/2016, 4:25 PM   LOS: 1 day

## 2016-05-05 NOTE — Progress Notes (Signed)
Pt. Bladder scanned w/ volume 999. In/out cath attempted X2. Pt ambulated to bathroom and voided 400

## 2016-05-05 NOTE — Care Management Note (Signed)
Case Management Note  Patient Details  Name: [name] MRN: [id number] Date of Birth: 1990-10-04  Subjective/Objective:           gstroporesis and dka1/nausea and vomiting/iv insulin         Action/Plan:Date:  May 05, 2016 Chart reviewed for concurrent status and case management needs. Will continue to follow patient progress. Discharge Planning: following for needs Expected discharge date: [id number] Velva Harman, BSN, [place], [place]  Expected Discharge Date:   (unknown)               Expected Discharge Plan:  Home/Self Care  In-House Referral:     Discharge planning Services     Post Acute Care Choice:    Choice offered to:     DME Arranged:    DME Agency:     HH Arranged:    [place] Agency:     Status of Service:  In process, will continue to follow  If discussed at Long Length of Stay Meetings, dates discussed:    Additional Comments:  Leeroy Cha, RN 05/05/2016, 10:19 AM

## 2016-05-05 NOTE — Progress Notes (Signed)
Nutrition Brief Note  Patient identified on the Malnutrition Screening Tool (MST) Report with score correlating to weight loss of 2.0 (which indicates pt is unsure).  Wt Readings from Last 15 Encounters:  05/04/16 122 lb 9.2 oz (55.6 kg)  03/04/16 108 lb 3.9 oz (49.1 kg)  02/28/16 125 lb (56.7 kg)  02/20/16 116 lb (52.6 kg)  02/05/16 116 lb (52.6 kg)  01/22/16 116 lb 6.5 oz (52.8 kg)  01/17/16 115 lb 1.3 oz (52.2 kg)  11/18/15 107 lb 9.4 oz (48.8 kg)  11/09/15 116 lb 2.9 oz (52.7 kg)  11/06/15 134 lb 8 oz (61 kg)  07/10/15 105 lb 1.6 oz (47.7 kg)  07/03/15 112 lb 3.4 oz (50.9 kg)  06/22/15 102 lb (46.3 kg)  06/10/15 102 lb (46.3 kg)  06/07/15 115 lb (52.2 kg)    Body mass index is 21.71 kg/m. Patient meets criteria for normal weight based on current BMI. Skin WDL. Pt with hx of poorly controlled Type 1 DM and gastroparesis who began experiencing N/V at 0500 yesterday (4/16). She continued to experience the same once she arrived to ED. She had taken Zofran at home PTA but experienced minimal to no relief from this. She states that she has been taking insulin as instructed prior to this admission.   Familiar with pt from previous admissions and have attempted education during those times. Pt continues to report that she does not have any questions or concerns regarding diet at this time.   Current diet order is NPO. Medications reviewed; Labs reviewed; CBGs: 163-197 mg/dL today; HgbA1c on 03/01/16: 8.9%. IVF: D5-1/2 NS @ 125 mL/hr (510 kcal).   No nutrition interventions warranted at this time. If nutrition issues arise, please consult RD.     Jarome Matin, MS, RD, LDN, [facility] Inpatient Clinical Dietitian Pager # [phone] After hours/weekend pager # [phone]

## 2016-05-06 DIAGNOSIS — E1143 Type 2 diabetes mellitus with diabetic autonomic (poly)neuropathy: Secondary | ICD-10-CM

## 2016-05-06 DIAGNOSIS — E1010 Type 1 diabetes mellitus with ketoacidosis without coma: Principal | ICD-10-CM

## 2016-05-06 DIAGNOSIS — R112 Nausea with vomiting, unspecified: Secondary | ICD-10-CM

## 2016-05-06 DIAGNOSIS — R000 Tachycardia, unspecified: Secondary | ICD-10-CM

## 2016-05-06 DIAGNOSIS — I5032 Chronic diastolic (congestive) heart failure: Secondary | ICD-10-CM

## 2016-05-06 DIAGNOSIS — K3184 Gastroparesis: Secondary | ICD-10-CM

## 2016-05-06 DIAGNOSIS — E1065 Type 1 diabetes mellitus with hyperglycemia: Secondary | ICD-10-CM

## 2016-05-06 LAB — BASIC METABOLIC PANEL
Anion gap: 18 — ABNORMAL HIGH (ref 5–15)
Anion gap: 13 (ref 5–15)
Anion gap: 12 (ref 5–15)
BUN: 10 mg/dL (ref 6–20)
BUN: 12 mg/dL (ref 6–20)
BUN: 6 mg/dL (ref 6–20)
BUN: 7 mg/dL (ref 6–20)
CALCIUM: 8.7 mg/dL — AB (ref 8.9–10.3)
CALCIUM: 8.7 mg/dL — AB (ref 8.9–10.3)
CHLORIDE: 109 mmol/L (ref 101–111)
CHLORIDE: 108 mmol/L (ref 101–111)
CO2: 8 mmol/L — AB (ref 22–32)
CO2: 13 mmol/L — AB (ref 22–32)
CO2: 14 mmol/L — AB (ref 22–32)
CREATININE: 0.96 mg/dL (ref 0.44–1.00)
CREATININE: 0.7 mg/dL (ref 0.44–1.00)
Calcium: 8.7 mg/dL — ABNORMAL LOW (ref 8.9–10.3)
Calcium: 8.3 mg/dL — ABNORMAL LOW (ref 8.9–10.3)
Chloride: 111 mmol/L (ref 101–111)
Chloride: 108 mmol/L (ref 101–111)
Creatinine, Ser: 0.75 mg/dL (ref 0.44–1.00)
Creatinine, Ser: 0.65 mg/dL (ref 0.44–1.00)
GFR calc Af Amer: >60 mL/min (ref >60)
GFR calc Af Amer: >60 mL/min (ref >60)
GFR calc non Af Amer: >60 mL/min (ref >60)
GLUCOSE: 451 mg/dL — AB (ref 65–99)
GLUCOSE: 152 mg/dL — AB (ref 65–99)
GLUCOSE: 150 mg/dL — AB (ref 65–99)
Glucose, Bld: 452 mg/dL — ABNORMAL HIGH (ref 65–99)
POTASSIUM: 4.5 mmol/L (ref 3.5–5.1)
POTASSIUM: 3.3 mmol/L — AB (ref 3.5–5.1)
Potassium: 5.4 mmol/L — ABNORMAL HIGH (ref 3.5–5.1)
Potassium: 3.6 mmol/L (ref 3.5–5.1)
SODIUM: 135 mmol/L (ref 135–145)
SODIUM: 137 mmol/L (ref 135–145)
Sodium: 134 mmol/L — ABNORMAL LOW (ref 135–145)
Sodium: 134 mmol/L — ABNORMAL LOW (ref 135–145)

## 2016-05-06 LAB — GLUCOSE, CAPILLARY
GLUCOSE-CAPILLARY: 396 mg/dL — AB (ref 65–99)
GLUCOSE-CAPILLARY: 219 mg/dL — AB (ref 65–99)
GLUCOSE-CAPILLARY: 230 mg/dL — AB (ref 65–99)
GLUCOSE-CAPILLARY: 167 mg/dL — AB (ref 65–99)
GLUCOSE-CAPILLARY: 136 mg/dL — AB (ref 65–99)
GLUCOSE-CAPILLARY: 133 mg/dL — AB (ref 65–99)
Glucose-Capillary: 135 mg/dL — ABNORMAL HIGH (ref 65–99)
Glucose-Capillary: 159 mg/dL — ABNORMAL HIGH (ref 65–99)
Glucose-Capillary: 190 mg/dL — ABNORMAL HIGH (ref 65–99)
Glucose-Capillary: 193 mg/dL — ABNORMAL HIGH (ref 65–99)
Glucose-Capillary: 240 mg/dL — ABNORMAL HIGH (ref 65–99)
Glucose-Capillary: 319 mg/dL — ABNORMAL HIGH (ref 65–99)
Glucose-Capillary: 374 mg/dL — ABNORMAL HIGH (ref 65–99)

## 2016-05-06 LAB — BLOOD GAS, ARTERIAL
ACID-BASE DEFICIT: 11 mmol/L — AB (ref 0.0–2.0)
BICARBONATE: 12.4 mmol/L — AB (ref 20.0–28.0)
Drawn by: 103701
Drawn by: 103701
FIO2: 21
FIO2: 21
O2 SAT: 96.4 %
O2 Saturation: 98.6 %
PATIENT TEMPERATURE: 98.6
PH ART: 7.376 (ref 7.350–7.450)
PO2 ART: 135 mmHg — AB (ref 83.0–108.0)
Patient temperature: 98.6
pCO2 arterial: 21.6 mmHg — ABNORMAL LOW (ref 32.0–48.0)
pH, Arterial: 6.982 — CL (ref 7.350–7.450)
pO2, Arterial: 112 mmHg — ABNORMAL HIGH (ref 83.0–108.0)

## 2016-05-06 LAB — URINE CULTURE

## 2016-05-06 LAB — CBC
HEMATOCRIT: 38.1 % (ref 36.0–46.0)
Hemoglobin: 12.5 g/dL (ref 12.0–15.0)
MCH: 30.3 pg (ref 26.0–34.0)
MCHC: 32.8 g/dL (ref 30.0–36.0)
MCV: 92.3 fL (ref 78.0–100.0)
Platelets: 393 10*3/uL (ref 150–400)
RBC: 4.13 MIL/uL (ref 3.87–5.11)
RDW: 13.5 % (ref 11.5–15.5)
WBC: 16.6 10*3/uL — AB (ref 4.0–10.5)

## 2016-05-06 LAB — MAGNESIUM: Magnesium: 1.8 mg/dL (ref 1.7–2.4)

## 2016-05-06 MED ORDER — SODIUM BICARBONATE 8.4 % IV SOLN
INTRAVENOUS | Status: AC
  Filled 2016-05-06: qty 100

## 2016-05-06 MED ORDER — POTASSIUM CHLORIDE CRYS ER 20 MEQ PO TBCR
20.0000 meq | EXTENDED_RELEASE_TABLET | Freq: Once | ORAL | Status: AC
  Administered 2016-05-06: 20 meq via ORAL
  Filled 2016-05-06: qty 1

## 2016-05-06 MED ORDER — STERILE WATER FOR INJECTION IV SOLN: INTRAVENOUS | Status: DC

## 2016-05-06 MED ORDER — DEXTROSE-NACL 5-0.45 % IV SOLN
INTRAVENOUS | Status: DC
  Administered 2016-05-06 – 2016-05-07 (×2): via INTRAVENOUS

## 2016-05-06 MED ORDER — SODIUM CHLORIDE 0.9 % IV SOLN
INTRAVENOUS | Status: DC
  Administered 2016-05-06 – 2016-05-10 (×7): via INTRAVENOUS

## 2016-05-06 MED ORDER — SODIUM CHLORIDE 0.9 % IV BOLUS (SEPSIS)
500.0000 mL | Freq: Once | INTRAVENOUS | Status: AC
  Administered 2016-05-06: 500 mL via INTRAVENOUS

## 2016-05-06 MED ORDER — INSULIN REGULAR HUMAN 100 UNIT/ML IJ SOLN
INTRAMUSCULAR | Status: DC
  Administered 2016-05-06: 3.6 [IU]/h via INTRAVENOUS
  Filled 2016-05-06 (×2): qty 2.5

## 2016-05-06 MED ORDER — LACTATED RINGERS IV BOLUS (SEPSIS)
1000.0000 mL | Freq: Once | INTRAVENOUS | Status: AC
  Administered 2016-05-06: 1000 mL via INTRAVENOUS

## 2016-05-06 MED ORDER — SODIUM CHLORIDE 0.9 % IV SOLN
INTRAVENOUS | Status: AC
  Administered 2016-05-06: 09:00:00 via INTRAVENOUS

## 2016-05-06 MED ORDER — SODIUM BICARBONATE 8.4 % IV SOLN: 50.0000 meq | Freq: Once | INTRAVENOUS | Status: DC

## 2016-05-06 MED ORDER — STERILE WATER FOR INJECTION IV SOLN
Freq: Once | INTRAVENOUS | Status: AC
  Administered 2016-05-06: 12:00:00 via INTRAVENOUS
  Filled 2016-05-06: qty 850

## 2016-05-06 MED ORDER — SODIUM BICARBONATE 8.4 % IV SOLN
50.0000 meq | Freq: Once | INTRAVENOUS | Status: AC
  Administered 2016-05-06: 50 meq via INTRAVENOUS

## 2016-05-06 MED ORDER — INSULIN GLARGINE 100 UNIT/ML ~~LOC~~ SOLN
25.0000 [IU] | Freq: Every day | SUBCUTANEOUS | Status: DC
  Filled 2016-05-06: qty 0.25

## 2016-05-06 MED ORDER — SODIUM CHLORIDE 0.9 % IV BOLUS (SEPSIS)
1000.0000 mL | Freq: Once | INTRAVENOUS | Status: AC
  Administered 2016-05-06: 1000 mL via INTRAVENOUS

## 2016-05-06 NOTE — Progress Notes (Signed)
PROGRESS NOTE    [name]  ZOX:[id number] DOB: September 30, 1990 DOA: 05/04/2016 PCP: Vicenta Aly, FNP   Brief Narrative: [name] is a 26 y.o. female with a history of DM, type 1, pancreatitis, liver adenomas, anxiety, asthma, hepatic steatosis. She presented in DKA. She was transitioned to subcutaneous insulin on 4/17 but reverted back to DKA on 4/18.   Assessment & Plan:   Active Problems:   DKA, type 1 (HCC)   Sinus tachycardia   Chronic diastolic heart failure (HCC)   Diabetic gastroparesis ([place])   Uncontrolled type 1 diabetes mellitus with hyperglycemia (HCC)   Gastroparesis due to DM ([place])  DKA Diabetes mellitus, type 1 Diabetes uncontrolled. Patient transitioned off of drip on 4/17 and given subq insulin, however, back in DKA this morning. Significant metabolic acidosis with CO2 of less than 5 on BMP. ABG obtained and pH significantly low at 6.982 and undetectable (low) pCO2. Patient at high risk for decompensation and possible intubation. -insulin drip -BMP q4 hours and trend CO2 and anion gap -give bicarb in fluids -ABG -IV NS bolus -CCM consult -cancel transfer out of stepdown  Sinus tachycardia Likely secondary to DKA and overall dehydrated state -fluids as above  Diabetic gastroparesis Stable -continue Reglan  Urinary retention Acute. Unknown etiology. Possibly secondary to medication effect. Foley placed -continue foley for now  DVT prophylaxis: Lovenox Code Status: Full code Family Communication: None at bedside Disposition Plan: Discharge home when medically stable   Consultants:   PCCM  Procedures:   ABG (4/18)  Antimicrobials:  None    Subjective: Patient reports no nausea or vomiting. She is thirsty. No abdominal pain. Last bowel movement three days ago. Tachypnea without chest pain or dyspnea.  Objective: Vitals:   05/06/16 0344 05/06/16 0428 05/06/16 0700 05/06/16 0800  BP:  (!) 161/98    Pulse:  (!) 140 (!) 147    Resp:  (!) 24 (!) 23   Temp: 99.4 F (37.4 C)   98.7 F (37.1 C)  TempSrc: Oral   Oral  SpO2:  100% 100%   Weight:      Height:        Intake/Output Summary (Last 24 hours) at 05/06/16 0929 Last data filed at 05/06/16 0811  Gross per 24 hour  Intake          3015.72 ml  Output             4300 ml  Net         -1284.28 ml   Filed Weights   05/04/16 2200  Weight: 55.6 kg (122 lb 9.2 oz)    Examination:  General exam: Appears calm and comfortable  Respiratory system: Clear to auscultation. Respiratory effort normal with tachypnea. Cardiovascular system: S1 & S2 heard, tachycardia, normal rhythm. No murmurs, rubs, gallops or clicks. Gastrointestinal system: Abdomen is nondistended, soft and nontender. Normal bowel sounds heard. Central nervous system: Alert and oriented. No focal neurological deficits. Extremities: No edema. No calf tenderness Skin: No cyanosis. No rashes Psychiatry: Judgement and insight appear normal. Mood & affect appropriate.     Data Reviewed: I have personally reviewed following labs and imaging studies  CBC:  Recent Labs Lab 05/04/16 1247 05/06/16 0332  WBC 14.8* 16.6*  HGB 13.4 12.5  HCT 39.6 38.1  MCV 89.8 92.3  PLT 446* 811   Basic Metabolic Panel:  Recent Labs Lab 05/04/16 1247 05/04/16 2208 05/05/16 0153 05/05/16 0702 05/06/16 0332  NA 137 140 137 136 135  K 3.9 4.3 4.1  3.5 4.5  CL 103 109 111 109 109  CO2 20* 13* 14* 17* 8*  GLUCOSE 277* 223* 215* 144* 452*  BUN 17 14 10 9 10   CREATININE 0.67 0.75 0.52 0.44 0.75  CALCIUM 9.4 9.4 8.6* 8.9 8.7*  MG  --  1.8  --   --  1.8   GFR: Estimated Creatinine Clearance: 88.2 mL/min (by C-G formula based on SCr of 0.75 mg/dL). Liver Function Tests:  Recent Labs Lab 05/04/16 1247  AST 27  ALT 18  ALKPHOS 117  BILITOT 1.2  PROT 8.5*  ALBUMIN 4.5    Recent Labs Lab 05/04/16 1247  LIPASE 14   No results for input(s): AMMONIA in the last 168 hours. Coagulation  Profile: No results for input(s): INR, PROTIME in the last 168 hours. Cardiac Enzymes: No results for input(s): CKTOTAL, CKMB, CKMBINDEX, TROPONINI in the last 168 hours. BNP (last 3 results) No results for input(s): PROBNP in the last 8760 hours. HbA1C: No results for input(s): HGBA1C in the last 72 hours. CBG:  Recent Labs Lab 05/05/16 0350 05/05/16 0454 05/05/16 2150 05/06/16 0445 05/06/16 0727  GLUCAP 185* 183* 317* 374* 396*   Lipid Profile: No results for input(s): CHOL, HDL, LDLCALC, TRIG, CHOLHDL, LDLDIRECT in the last 72 hours. Thyroid Function Tests: No results for input(s): TSH, T4TOTAL, FREET4, T3FREE, THYROIDAB in the last 72 hours. Anemia Panel: No results for input(s): VITAMINB12, FOLATE, FERRITIN, TIBC, IRON, RETICCTPCT in the last 72 hours. Sepsis Labs:  Recent Labs Lab 05/04/16 1300  LATICACIDVEN 1.56    Recent Results (from the past 240 hour(s))  Urine culture     Status: Abnormal   Collection Time: 05/04/16  5:45 PM  Result Value Ref Range Status   Specimen Description URINE, RANDOM  Final   Special Requests NONE  Final   Culture MULTIPLE SPECIES PRESENT, SUGGEST RECOLLECTION (A)  Final   Report Status 05/06/2016 FINAL  Final  MRSA PCR Screening     Status: None   Collection Time: 05/04/16 11:00 PM  Result Value Ref Range Status   MRSA by PCR NEGATIVE NEGATIVE Final    Comment:        The GeneXpert MRSA Assay (FDA approved for NASAL specimens only), is one component of a comprehensive MRSA colonization surveillance program. It is not intended to diagnose MRSA infection nor to guide or monitor treatment for MRSA infections.          Radiology Studies: Dg Chest 2 View  Result Date: 05/04/2016 CLINICAL DATA:  Acute onset of nausea, vomiting and generalized abdominal tenderness. Dark emesis. Severe lethargy. Initial encounter. EXAM: CHEST  2 VIEW COMPARISON:  Chest radiograph from 02/29/2016 FINDINGS: The lungs are well-aerated.  Pulmonary vascularity is at the upper limits of normal. There is no evidence of focal opacification, pleural effusion or pneumothorax. The heart is normal in size; the mediastinal contour is within normal limits. No acute osseous abnormalities are seen. IMPRESSION: No acute cardiopulmonary process seen. Electronically Signed   By: Garald Balding M.D.   On: 05/04/2016 23:32        Scheduled Meds: . enoxaparin (LOVENOX) injection  40 mg Subcutaneous Q24H  . escitalopram  5 mg Oral Daily  . metoCLOPramide (REGLAN) injection  5 mg Intravenous TID AC & HS  . mometasone-formoterol  2 puff Inhalation BID  . montelukast  10 mg Oral Daily  . Vitamin D (Ergocalciferol)  50,000 Units Oral Q7 days   Continuous Infusions: . sodium chloride 999 mL/hr at 05/06/16  6295  . sodium chloride    . dextrose 5 % and 0.45% NaCl    . insulin (NOVOLIN-R) infusion 3.6 Units/hr (05/06/16 0918)     LOS: 2 days     Cordelia Poche, MD [facility] 05/06/2016, 9:29 AM Pager: [phone]  If 7PM-7AM, please contact night-coverage [email or web address] Password [facility] 05/06/2016, 9:29 AM

## 2016-05-06 NOTE — Progress Notes (Signed)
   05/06/16 1100  Clinical Encounter Type  Visited With Patient and family together;Health care provider  Visit Type Follow-up;Psychological support;Spiritual support;Critical Care  Referral From Nurse  Consult/Referral To Chaplain  Spiritual Encounters  Spiritual Needs Emotional;Other (Comment) (Pastoral Conversation/Support)  Stress Factors  Patient Stress Factors Major life changes;Health changes  Family Stress Factors Health changes;Major life changes   I followed up with the patient in the ICU. The patient was having difficulty breathing at the time of my visit. The patient's great aunt was present at the bedside and was being a supportive presence to the patient. The patient's aunt requested that we pray for the patient and her recent health changes.  The patient acknowledged the seriousness of her condition and was getting anxious about her difficulty breathing.  We prayed at the bedside with the patient.  I provided the patient with a cap to help keep her head warm.   Please, contact Spiritual Care for further assistance.   [place] M.Div.

## 2016-05-06 NOTE — Progress Notes (Signed)
Pt CBG 396 at 0727. Anion gap 18. CO2 8. MD ordered to restart insulin gtt and glucostabilizer. HR sustaining in 140's. MD notified. 2 L boluses ordered. Will continue to monitor.

## 2016-05-06 NOTE — Progress Notes (Signed)
Inpatient Diabetes Program Recommendations  AACE/ADA: New Consensus Statement on Inpatient Glycemic Control (2015)  Target Ranges:  Prepandial:   less than 140 mg/dL      Peak postprandial:   less than 180 mg/dL (1-2 hours)      Critically ill patients:  140 - 180 mg/dL   Lab Results  Component Value Date   GLUCAP 219 (H) 05/06/2016   HGBA1C 8.9 (H) 03/01/2016    Review of Glycemic Control  Diabetes history: DM1 Outpatient Diabetes medications: Basaglar 25 units QHS, Novolog 1:10 CHO ratio, and 1 units for every 50 over 150 mg/dL. Current orders for Inpatient glycemic control: IV insulin per DKA order set  Inpatient Diabetes Program Recommendations:     When criteria met to transition off drip: Give Lantus 2 hours prior to discontinuation of drip  Increase Lantus to 18 units QHS Novolog 0-9 units Q4H x 12 H, then tidwc and hs Novolog 6 units tidwc for meal coverage insulin if pt eats > 50% meal  Will continue to follow.  Thank you. Lorenda Peck, RD, LDN, CDE Inpatient Diabetes Coordinator [phone]

## 2016-05-06 NOTE — Consult Note (Signed)
PULMONARY / CRITICAL CARE MEDICINE   Name: [name] MRN: [id number] DOB: December 06, 1990    ADMISSION DATE:  05/04/2016 CONSULTATION DATE:  05/06/16  REFERRING MD:  Georgena Spurling M.D.  CHIEF COMPLAINT:  DKA acidosis  HISTORY OF PRESENT ILLNESS:   This is a 26 year old female w/ h/o asthma, GB disease, DM w/ prior admits for DKA, diabetic gastroparesis, pancreatitis, liver adenomas. Admitted on 4/16 with nausea, vomiting, abdominal pain. She was found to be in DKA. She was transitioned off insulin drip yesterday but today her glucose is higher with opening up of her gap, profound metabolic acidosis on ABG. PCCM consulted for help with further management, need for intubation  PAST MEDICAL HISTORY :  She  has a past medical history of Anxiety; Arthritis; Asthma; Diabetes mellitus (2007); Gallstones; Gastroparesis; Heart murmur; Hepatic steatosis (11/26/2014); Hypertension; Liver mass (11/26/2014); and Pancreatitis, acute (11/26/2014).  PAST SURGICAL HISTORY: She  has a past surgical history that includes Wisdom tooth extraction; Esophagogastroduodenoscopy (egd) with propofol (Left, 09/20/2014); and Cholecystectomy (N/A, 02/11/2015).  Allergies  Allergen Reactions  . Peanut-Containing Drug Products Swelling    Swelling of mouth, lips  . Strawberry Extract Swelling    Swelling of mouth, lips  . Ultram [Tramadol] Itching    No current facility-administered medications on file prior to encounter.    Current Outpatient Prescriptions on File Prior to Encounter  Medication Sig  . albuterol (PROVENTIL HFA;VENTOLIN HFA) 108 (90 BASE) MCG/ACT inhaler Inhale 2 puffs into the lungs every 6 (six) hours as needed for shortness of breath.  [name] albuterol (PROVENTIL) (2.5 MG/3ML) 0.083% nebulizer solution Take 2.5 mg by nebulization every 6 (six) hours as needed for shortness of breath.  . budesonide-formoterol (SYMBICORT) 160-4.5 MCG/ACT inhaler Inhale 2 puffs into the lungs daily.   [name] escitalopram (LEXAPRO)  10 MG tablet Take 0.5 tablets (5 mg total) by mouth daily.  . famotidine (PEPCID) 20 MG tablet Take 1 tablet (20 mg total) by mouth at bedtime.  . Insulin Glargine (BASAGLAR KWIKPEN) 100 UNIT/ML SOPN Inject 0.25 mLs (25 Units total) into the skin at bedtime.  [name] NOVOLOG FLEXPEN 100 UNIT/ML FlexPen 1 UNIT FOR EVERY 10 GRAMS CARB W/MEALS/SNACKS PLUS SLIDING SCALE: 1 UNIT GLUCOSE 50 ABOVE 150.TDD 20  . fluconazole (DIFLUCAN) 100 MG tablet Take 1 tablet (100 mg total) by mouth daily. (Patient not taking: Reported on 02/29/2016)  . magnesium oxide (MAG-OX) 400 MG tablet Take 2 tablets (800 mg total) by mouth daily. (Patient not taking: Reported on 05/05/2016)  . metoCLOPramide (REGLAN) 10 MG tablet Take 1 tablet (10 mg total) by mouth 3 (three) times daily before meals. (Patient not taking: Reported on 05/05/2016)  . pantoprazole (PROTONIX) 40 MG tablet Take 1 tablet (40 mg total) by mouth 2 (two) times daily. (Patient not taking: Reported on 05/05/2016)  . potassium chloride SA (K-DUR,KLOR-CON) 20 MEQ tablet Take 2 tablets (40 mEq total) by mouth daily. (Patient not taking: Reported on 05/05/2016)    FAMILY HISTORY:  Her indicated that the status of her mother is unknown. She indicated that the status of her father is unknown. She indicated that her maternal grandmother is deceased. She indicated that her maternal grandfather is deceased. She indicated that the status of her paternal grandmother is unknown. She indicated that the status of her paternal grandfather is unknown.    SOCIAL HISTORY: She  reports that she has never smoked. She has never used smokeless tobacco. She reports that she uses drugs, including Marijuana. She reports that she  does not drink alcohol.  REVIEW OF SYSTEMS:   Complains of dyspnea. Denies any cough, sputum production, wheezing Positive for nausea, vomiting. Denies any diarrhea, constipation Denies any abdominal pain, fevers, chills Denies any chest pain, palpitations and all  other review of systems negative  SUBJECTIVE:    VITAL SIGNS: BP (!) 159/87 (BP Location: Right Arm)   Pulse (!) 148   Temp 98.7 F (37.1 C) (Oral)   Resp (!) 27   Ht 5\' 3"  (1.6 m)   Wt 122 lb 9.2 oz (55.6 kg)   LMP 04/19/2016   SpO2 100%   BMI 21.71 kg/m   HEMODYNAMICS:    VENTILATOR SETTINGS:    INTAKE / OUTPUT: I/O last 3 completed shifts: In: 7262.2 [I.V.:6962.2; IV [id number]:381] Out: 8299 [Urine:3850; Emesis/NG output:300]  PHYSICAL EXAMINATION: Blood pressure (!) 159/87, pulse (!) 148, temperature 98.7 F (37.1 C), temperature source Oral, resp. rate (!) 27, height 5\' 3"  (1.6 m), weight 122 lb 9.2 oz (55.6 kg), last menstrual period 04/19/2016, SpO2 100 %. Gen:      No acute distress HEENT:  EOMI, sclera anicteric Neck:     No masses; no thyromegaly Lungs:    tachypnea, Clear to auscultation bilaterally; moderate increase in resp effort. CV:         Regular rate and rhythm; no murmurs Abd:      + bowel sounds; soft, non-tender; no palpable masses, no distension Ext:    No edema; adequate peripheral perfusion Skin:      Warm and dry; no rash Neuro: alert and oriented x 3 Psych: normal mood and affect  LABS:  BMET  Recent Labs Lab 05/05/16 0702 05/06/16 0332 05/06/16 0853  NA 136 135 137  K 3.5 4.5 5.4*  CL 109 109 111  CO2 17* 8* <5*  BUN 9 10 12   CREATININE 0.44 0.75 0.96  GLUCOSE 144* 452* 451*    Electrolytes  Recent Labs Lab 05/04/16 2208  05/05/16 0702 05/06/16 0332 05/06/16 0853  CALCIUM 9.4  < > 8.9 8.7* 8.7*  MG 1.8  --   --  1.8  --   < > = values in this interval not displayed.  CBC  Recent Labs Lab 05/04/16 1247 05/06/16 0332  WBC 14.8* 16.6*  HGB 13.4 12.5  HCT 39.6 38.1  PLT 446* 393    Coag's No results for input(s): APTT, INR in the last 168 hours.  Sepsis Markers  Recent Labs Lab 05/04/16 1300  LATICACIDVEN 1.56    ABG  Recent Labs Lab 05/06/16 1030  PHART 6.982*  PCO2ART BELOW REPORTABLE RANGE   PO2ART 135*    Liver Enzymes  Recent Labs Lab 05/04/16 1247  AST 27  ALT 18  ALKPHOS 117  BILITOT 1.2  ALBUMIN 4.5    Cardiac Enzymes No results for input(s): TROPONINI, PROBNP in the last 168 hours.  Glucose  Recent Labs Lab 05/05/16 0350 05/05/16 0454 05/05/16 2150 05/06/16 0445 05/06/16 0727 05/06/16 1028  GLUCAP 185* 183* 317* 374* 396* 319*    Imaging CXR 05/05/15- no acute cardiopulmonary abnormality. I have reviewed the images personally.  STUDIES:    CULTURES:  ANTIBIOTICS:   SIGNIFICANT EVENTS:   LINES/TUBES:  DISCUSSION: 26 year old with recurrent DKA. Gap closed initially but opened up again.  Now with profound metabolic acidosis.  I reviewed her labs. Her ABG shows nearly undetectable CO2. She is responding appropriately with her increased respiratory rate to compensate for metabolic acidosis. I do not think we'll be able to  match this on the ventilator and would cause harm with worsening acidosis if we intubate her.  DKA Metabolic acidosis - Continue insulin drip - Give 2 amps bicarb - Agree with bicarb drip at 200cc/hr - Continue aggressive fluid resuscitation. 1 lt LR bolus - Replete lyes per DKA protocol - Repeat ABG in 2-3 hrs.   Sinus tachycardia Likely from volume depletion - Continue to monitor - Check lactic acid  Diabetic gastroparesis H/O pancreatitis. Normal lipase on admission - Keep NPO for now.  Asthma - No evidence of exacerbation.  - Continue dulera inhaler  Leukocytosis. Likely stress No evidence of infection - Montior off abx  We will continue to monitor.  The patient is critically ill with multiple organ system failure and requires high complexity decision making for assessment and support, frequent evaluation and titration of therapies, advanced monitoring, review of radiographic studies and interpretation of complex data.   Critical Care Time devoted to patient care services, exclusive of separately  billable procedures, described in this note is 45 minutes.   Marshell Garfinkel MD [facility] Pager [phone] If no answer or after 3pm call: [phone] 05/06/2016, 11:34 AM

## 2016-05-07 DIAGNOSIS — R0600 Dyspnea, unspecified: Secondary | ICD-10-CM

## 2016-05-07 LAB — BASIC METABOLIC PANEL
ANION GAP: 10 (ref 5–15)
ANION GAP: 7 (ref 5–15)
ANION GAP: 7 (ref 5–15)
Anion gap: 14 (ref 5–15)
Anion gap: 9 (ref 5–15)
BUN: 6 mg/dL (ref 6–20)
BUN: <5 mg/dL — ABNORMAL LOW (ref 6–20)
BUN: <5 mg/dL — ABNORMAL LOW (ref 6–20)
BUN: <5 mg/dL — ABNORMAL LOW (ref 6–20)
CALCIUM: 8.7 mg/dL — AB (ref 8.9–10.3)
CALCIUM: 7.8 mg/dL — AB (ref 8.9–10.3)
CHLORIDE: 106 mmol/L (ref 101–111)
CHLORIDE: 107 mmol/L (ref 101–111)
CHLORIDE: 106 mmol/L (ref 101–111)
CO2: 13 mmol/L — AB (ref 22–32)
CO2: 19 mmol/L — ABNORMAL LOW (ref 22–32)
CO2: 21 mmol/L — ABNORMAL LOW (ref 22–32)
CO2: 23 mmol/L (ref 22–32)
CO2: 23 mmol/L (ref 22–32)
Calcium: 8.9 mg/dL (ref 8.9–10.3)
Calcium: 8.3 mg/dL — ABNORMAL LOW (ref 8.9–10.3)
Calcium: 8 mg/dL — ABNORMAL LOW (ref 8.9–10.3)
Chloride: 107 mmol/L (ref 101–111)
Chloride: 108 mmol/L (ref 101–111)
Creatinine, Ser: 0.58 mg/dL (ref 0.44–1.00)
Creatinine, Ser: 0.47 mg/dL (ref 0.44–1.00)
Creatinine, Ser: 0.48 mg/dL (ref 0.44–1.00)
Creatinine, Ser: 0.41 mg/dL — ABNORMAL LOW (ref 0.44–1.00)
Creatinine, Ser: 0.42 mg/dL — ABNORMAL LOW (ref 0.44–1.00)
GFR calc Af Amer: >60 mL/min (ref >60)
GFR calc Af Amer: >60 mL/min (ref >60)
GFR calc non Af Amer: >60 mL/min (ref >60)
GFR calc non Af Amer: >60 mL/min (ref >60)
GFR calc non Af Amer: >60 mL/min (ref >60)
GFR calc non Af Amer: >60 mL/min (ref >60)
GFR calc non Af Amer: >60 mL/min (ref >60)
GLUCOSE: 182 mg/dL — AB (ref 65–99)
GLUCOSE: 186 mg/dL — AB (ref 65–99)
GLUCOSE: 138 mg/dL — AB (ref 65–99)
GLUCOSE: 216 mg/dL — AB (ref 65–99)
Glucose, Bld: 163 mg/dL — ABNORMAL HIGH (ref 65–99)
POTASSIUM: 3.4 mmol/L — AB (ref 3.5–5.1)
POTASSIUM: 2.6 mmol/L — AB (ref 3.5–5.1)
POTASSIUM: 2.7 mmol/L — AB (ref 3.5–5.1)
Potassium: 3.1 mmol/L — ABNORMAL LOW (ref 3.5–5.1)
Potassium: 3 mmol/L — ABNORMAL LOW (ref 3.5–5.1)
Sodium: 134 mmol/L — ABNORMAL LOW (ref 135–145)
Sodium: 136 mmol/L (ref 135–145)
Sodium: 137 mmol/L (ref 135–145)
Sodium: 137 mmol/L (ref 135–145)
Sodium: 136 mmol/L (ref 135–145)

## 2016-05-07 LAB — GLUCOSE, CAPILLARY
GLUCOSE-CAPILLARY: 125 mg/dL — AB (ref 65–99)
GLUCOSE-CAPILLARY: 135 mg/dL — AB (ref 65–99)
GLUCOSE-CAPILLARY: 201 mg/dL — AB (ref 65–99)
GLUCOSE-CAPILLARY: 155 mg/dL — AB (ref 65–99)
GLUCOSE-CAPILLARY: 150 mg/dL — AB (ref 65–99)
GLUCOSE-CAPILLARY: 143 mg/dL — AB (ref 65–99)
GLUCOSE-CAPILLARY: 122 mg/dL — AB (ref 65–99)
GLUCOSE-CAPILLARY: 153 mg/dL — AB (ref 65–99)
GLUCOSE-CAPILLARY: 144 mg/dL — AB (ref 65–99)
GLUCOSE-CAPILLARY: 167 mg/dL — AB (ref 65–99)
GLUCOSE-CAPILLARY: 150 mg/dL — AB (ref 65–99)
GLUCOSE-CAPILLARY: 148 mg/dL — AB (ref 65–99)
GLUCOSE-CAPILLARY: 150 mg/dL — AB (ref 65–99)
GLUCOSE-CAPILLARY: 159 mg/dL — AB (ref 65–99)
GLUCOSE-CAPILLARY: 133 mg/dL — AB (ref 65–99)
Glucose-Capillary: 168 mg/dL — ABNORMAL HIGH (ref 65–99)
Glucose-Capillary: 215 mg/dL — ABNORMAL HIGH (ref 65–99)
Glucose-Capillary: 415 mg/dL — ABNORMAL HIGH (ref 65–99)
Glucose-Capillary: 158 mg/dL — ABNORMAL HIGH (ref 65–99)
Glucose-Capillary: 163 mg/dL — ABNORMAL HIGH (ref 65–99)
Glucose-Capillary: 155 mg/dL — ABNORMAL HIGH (ref 65–99)
Glucose-Capillary: 146 mg/dL — ABNORMAL HIGH (ref 65–99)
Glucose-Capillary: 183 mg/dL — ABNORMAL HIGH (ref 65–99)
Glucose-Capillary: 168 mg/dL — ABNORMAL HIGH (ref 65–99)
Glucose-Capillary: 196 mg/dL — ABNORMAL HIGH (ref 65–99)
Glucose-Capillary: 170 mg/dL — ABNORMAL HIGH (ref 65–99)
Glucose-Capillary: 153 mg/dL — ABNORMAL HIGH (ref 65–99)
Glucose-Capillary: 133 mg/dL — ABNORMAL HIGH (ref 65–99)
Glucose-Capillary: 270 mg/dL — ABNORMAL HIGH (ref 65–99)

## 2016-05-07 LAB — LACTIC ACID, PLASMA: LACTIC ACID, VENOUS: 1.1 mmol/L (ref 0.5–1.9)

## 2016-05-07 MED ORDER — POTASSIUM CHLORIDE CRYS ER 20 MEQ PO TBCR
40.0000 meq | EXTENDED_RELEASE_TABLET | Freq: Once | ORAL | Status: DC
  Filled 2016-05-07: qty 2

## 2016-05-07 MED ORDER — POTASSIUM CHLORIDE 10 MEQ/100ML IV SOLN
10.0000 meq | INTRAVENOUS | Status: AC
Start: 2016-05-07 — End: 2016-05-08
  Administered 2016-05-07 – 2016-05-08 (×6): 10 meq via INTRAVENOUS
  Filled 2016-05-07 (×6): qty 100

## 2016-05-07 MED ORDER — SODIUM CHLORIDE 0.9 % IV BOLUS (SEPSIS)
1000.0000 mL | Freq: Once | INTRAVENOUS | Status: AC
  Administered 2016-05-07: 1000 mL via INTRAVENOUS

## 2016-05-07 MED ORDER — SODIUM CHLORIDE 0.9 % IV SOLN: 30.0000 meq | Freq: Once | INTRAVENOUS | Status: DC

## 2016-05-07 MED ORDER — SODIUM CHLORIDE 0.9 % IV BOLUS (SEPSIS)
2000.0000 mL | Freq: Once | INTRAVENOUS | Status: AC
  Administered 2016-05-07: 2000 mL via INTRAVENOUS

## 2016-05-07 MED ORDER — MORPHINE SULFATE (PF) 4 MG/ML IV SOLN
2.0000 mg | INTRAVENOUS | Status: DC | PRN
  Administered 2016-05-07 – 2016-05-09 (×9): 2 mg via INTRAVENOUS
  Filled 2016-05-07 (×9): qty 1

## 2016-05-07 MED ORDER — POTASSIUM CHLORIDE 10 MEQ/100ML IV SOLN
10.0000 meq | INTRAVENOUS | Status: AC
  Administered 2016-05-07 (×3): 10 meq via INTRAVENOUS
  Filled 2016-05-07 (×3): qty 100

## 2016-05-07 MED ORDER — INSULIN ASPART 100 UNIT/ML ~~LOC~~ SOLN
0.0000 [IU] | Freq: Every day | SUBCUTANEOUS | Status: DC
  Administered 2016-05-07: 3 [IU] via SUBCUTANEOUS

## 2016-05-07 MED ORDER — INSULIN GLARGINE 100 UNIT/ML ~~LOC~~ SOLN
20.0000 [IU] | Freq: Every day | SUBCUTANEOUS | Status: DC
  Administered 2016-05-07: 20 [IU] via SUBCUTANEOUS
  Filled 2016-05-07 (×2): qty 0.2

## 2016-05-07 MED ORDER — INSULIN ASPART 100 UNIT/ML ~~LOC~~ SOLN
0.0000 [IU] | Freq: Three times a day (TID) | SUBCUTANEOUS | Status: DC
  Administered 2016-05-07: 1 [IU] via SUBCUTANEOUS

## 2016-05-07 MED ORDER — POTASSIUM CHLORIDE CRYS ER 20 MEQ PO TBCR: 30.0000 meq | EXTENDED_RELEASE_TABLET | ORAL | Status: DC

## 2016-05-07 NOTE — Progress Notes (Signed)
MD paged about BMT result and K 3.0.

## 2016-05-07 NOTE — Progress Notes (Signed)
Inpatient Diabetes Program Recommendations  AACE/ADA: New Consensus Statement on Inpatient Glycemic Control (2015)  Target Ranges:  Prepandial:   less than 140 mg/dL      Peak postprandial:   less than 180 mg/dL (1-2 hours)      Critically ill patients:  140 - 180 mg/dL   Lab Results  Component Value Date   GLUCAP 168 (H) 05/07/2016   HGBA1C 8.9 (H) 03/01/2016    Review of Glycemic Control  Diabetes history: DM1 Outpatient Diabetes medications: Lantus 25 units QHS, Novolog 1:10 CHO ratio and CF 50. Current orders for Inpatient glycemic control: IV insulin  When ready to transition to SQ insulin, give Lantus 2 hours prior to discontinuing drip.  Inpatient Diabetes Program Recommendations:    Lantus 18 units Q24H Novolog 5 units tidwc + 0-9 units Q4H x 12H, then tidwc and hs.  Will follow. Thank you. Lorenda Peck, RD, LDN, CDE Inpatient Diabetes Coordinator [phone]

## 2016-05-07 NOTE — Progress Notes (Signed)
PROGRESS NOTE    [name]  ZDG:[id number] DOB: 26-Aug-1990 DOA: 05/04/2016 PCP: Vicenta Aly, FNP   Brief Narrative: [name] is a 26 y.o. female with a history of DM, type 1, pancreatitis, liver adenomas, anxiety, asthma, hepatic steatosis. She presented in DKA. She was transitioned to subcutaneous insulin on 4/17 but reverted back to DKA on 4/18.   Assessment & Plan:   Active Problems:   DKA, type 1 (HCC)   Sinus tachycardia   Chronic diastolic heart failure (HCC)   Diabetic gastroparesis ([place])   Uncontrolled type 1 diabetes mellitus with hyperglycemia (HCC)   Gastroparesis due to DM ([place])  DKA Diabetes mellitus, type 1 Diabetes uncontrolled. Previously transitioned but gap reopened and patient became significantly acidemic. Drip restarted with improvement in DKA. Gap closed x1 with continued metabolic acidosis -insulin drip -BMP q4 hours and trend CO2 and anion gap  Sinus tachycardia Likely secondary to DKA and overall dehydrated state -fluids  Diabetic gastroparesis Stable -continue Reglan  Urinary retention Acute. Unknown etiology. Possibly secondary to medication effect. Foley placed -continue foley for now -voiding trial in AM  DVT prophylaxis: Lovenox Code Status: Full code Family Communication: None at bedside Disposition Plan: Discharge home when medically stable   Consultants:   PCCM  Procedures:   ABG (4/18)  Antimicrobials:  None    Subjective: Patient reports emesis overnight. Some abdominal pain from emesis.   Objective: Vitals:   05/07/16 0400 05/07/16 0528 05/07/16 0800 05/07/16 0940  BP: (!) 150/97  (!) 134/94   Pulse: (!) 118  (!) 121 (!) 115  Resp: 20  16 (!) 23  Temp:  98.4 F (36.9 C) 98.7 F (37.1 C)   TempSrc:  Oral Oral   SpO2: 100%  100% 100%  Weight:  50.8 kg (111 lb 15.9 oz)    Height:        Intake/Output Summary (Last 24 hours) at 05/07/16 1128 Last data filed at 05/07/16 0612  Gross per  24 hour  Intake          2135.48 ml  Output             4970 ml  Net         -2834.52 ml   Filed Weights   05/04/16 2200 05/07/16 0528  Weight: 55.6 kg (122 lb 9.2 oz) 50.8 kg (111 lb 15.9 oz)    Examination:  General exam: Appears calm and comfortable  Respiratory system: Clear to auscultation. Respiratory effort normal with tachypnea. Cardiovascular system: S1 & S2 heard, tachycardia, normal rhythm. No murmurs, rubs, gallops or clicks. Gastrointestinal system: Abdomen is nondistended, soft and nontender. Normal bowel sounds heard. Central nervous system: Alert and oriented. No focal neurological deficits. Extremities: No edema. No calf tenderness Skin: No cyanosis. No rashes Psychiatry: Judgement and insight appear normal. Mood & affect appropriate.     Data Reviewed: I have personally reviewed following labs and imaging studies  CBC:  Recent Labs Lab 05/04/16 1247 05/06/16 0332  WBC 14.8* 16.6*  HGB 13.4 12.5  HCT 39.6 38.1  MCV 89.8 92.3  PLT 446* 951   Basic Metabolic Panel:  Recent Labs Lab 05/04/16 2208  05/06/16 0332 05/06/16 0853 05/06/16 1959 05/06/16 2312 05/07/16 0112 05/07/16 0603  NA 140  < > 135 137 134* 134* 134* 136  K 4.3  < > 4.5 5.4* 3.6 3.3* 3.1* 3.4*  CL 109  < > 109 111 108 108 107 108  CO2 13*  < > 8* <5* 13* 14*  13* 19*  GLUCOSE 223*  < > 452* 451* 152* 150* 182* 163*  BUN 14  < > 10 12 6 7 6  <5*  CREATININE 0.75  < > 0.75 0.96 0.70 0.65 0.58 0.47  CALCIUM 9.4  < > 8.7* 8.7* 8.7* 8.3* 8.7* 7.8*  MG 1.8  --  1.8  --   --   --   --   --   < > = values in this interval not displayed. GFR: Estimated Creatinine Clearance: 85.5 mL/min (by C-G formula based on SCr of 0.47 mg/dL). Liver Function Tests:  Recent Labs Lab 05/04/16 1247  AST 27  ALT 18  ALKPHOS 117  BILITOT 1.2  PROT 8.5*  ALBUMIN 4.5    Recent Labs Lab 05/04/16 1247  LIPASE 14   No results for input(s): AMMONIA in the last 168 hours. Coagulation Profile: No  results for input(s): INR, PROTIME in the last 168 hours. Cardiac Enzymes: No results for input(s): CKTOTAL, CKMB, CKMBINDEX, TROPONINI in the last 168 hours. BNP (last 3 results) No results for input(s): PROBNP in the last 8760 hours. HbA1C: No results for input(s): HGBA1C in the last 72 hours. CBG:  Recent Labs Lab 05/07/16 0511 05/07/16 0608 05/07/16 0755 05/07/16 0859 05/07/16 1003  GLUCAP 148* 146* 183* 150* 168*   Lipid Profile: No results for input(s): CHOL, HDL, LDLCALC, TRIG, CHOLHDL, LDLDIRECT in the last 72 hours. Thyroid Function Tests: No results for input(s): TSH, T4TOTAL, FREET4, T3FREE, THYROIDAB in the last 72 hours. Anemia Panel: No results for input(s): VITAMINB12, FOLATE, FERRITIN, TIBC, IRON, RETICCTPCT in the last 72 hours. Sepsis Labs:  Recent Labs Lab 05/04/16 1300 05/07/16 0112  LATICACIDVEN 1.56 1.1    Recent Results (from the past 240 hour(s))  Urine culture     Status: Abnormal   Collection Time: 05/04/16  5:45 PM  Result Value Ref Range Status   Specimen Description URINE, RANDOM  Final   Special Requests NONE  Final   Culture MULTIPLE SPECIES PRESENT, SUGGEST RECOLLECTION (A)  Final   Report Status 05/06/2016 FINAL  Final  MRSA PCR Screening     Status: None   Collection Time: 05/04/16 11:00 PM  Result Value Ref Range Status   MRSA by PCR NEGATIVE NEGATIVE Final    Comment:        The GeneXpert MRSA Assay (FDA approved for NASAL specimens only), is one component of a comprehensive MRSA colonization surveillance program. It is not intended to diagnose MRSA infection nor to guide or monitor treatment for MRSA infections.          Radiology Studies: No results found.      Scheduled Meds: . enoxaparin (LOVENOX) injection  40 mg Subcutaneous Q24H  . escitalopram  5 mg Oral Daily  . metoCLOPramide (REGLAN) injection  5 mg Intravenous TID AC & HS  . mometasone-formoterol  2 puff Inhalation BID  . montelukast  10 mg Oral  Daily  . Vitamin D (Ergocalciferol)  50,000 Units Oral Q7 days   Continuous Infusions: . sodium chloride Stopped (05/06/16 1148)  . dextrose 5 % and 0.45% NaCl 100 mL/hr at 05/07/16 0415  . insulin (NOVOLIN-R) infusion 4.1 Units/hr (05/07/16 1119)     LOS: 3 days     Cordelia Poche, MD [facility] 05/07/2016, 11:28 AM Pager: [phone]  If 7PM-7AM, please contact night-coverage [email or web address] Password [facility] 05/07/2016, 11:28 AM

## 2016-05-07 NOTE — Progress Notes (Signed)
PULMONARY / CRITICAL CARE MEDICINE   Name: [name] MRN: [id number] DOB: Jun 27, 1990    ADMISSION DATE:  05/04/2016 CONSULTATION DATE:  05/06/16  REFERRING MD:  Georgena Spurling M.D.  CHIEF COMPLAINT:  DKA acidosis  BRIEF SUMMARY:  26 year old female w/ h/o asthma, GB disease, DM w/ prior admits for DKA, diabetic gastroparesis, pancreatitis, liver adenomas. Admitted on 4/16 with nausea, vomiting, abdominal pain. She was found to be in DKA. She was transitioned off insulin drip 4/17 but was noted 4/18 to have an increase in glucose and widened AG with profound metabolic acidosis.  PCCM called back for assistance.    SUBJECTIVE: Pt reports ongoing nausea / vomiting.  States she "threw up all night".   VITAL SIGNS: BP (!) 134/94   Pulse (!) 115   Temp 98.7 F (37.1 C) (Oral)   Resp (!) 23   Ht 5\' 3"  (1.6 m)   Wt 111 lb 15.9 oz (50.8 kg)   LMP 04/19/2016   SpO2 100%   BMI 19.84 kg/m   HEMODYNAMICS:    VENTILATOR SETTINGS:    INTAKE / OUTPUT: I/O last 3 completed shifts: In: 4111 [I.V.:3811; IV Piggyback:300] Out: 8770 [Urine:8770]  PHYSICAL EXAMINATION: General: well developed adult female in NAD, appears uncomfortable  HEENT: MM pink/dry PSY: calm/appropriate Neuro: AAOx4, speech clear, MAE CV: s1s2 rrr, no m/r/g PULM: even/non-labored, lungs bilaterally clear GE:XBMW, non-tender, bsx4 active  Extremities: warm/dry, no edema  Skin: no rashes or lesions   LABS:  BMET  Recent Labs Lab 05/06/16 2312 05/07/16 0112 05/07/16 0603  NA 134* 134* 136  K 3.3* 3.1* 3.4*  CL 108 107 108  CO2 14* 13* 19*  BUN 7 6 <5*  CREATININE 0.65 0.58 0.47  GLUCOSE 150* 182* 163*    Electrolytes  Recent Labs Lab 05/04/16 2208  05/06/16 0332  05/06/16 2312 05/07/16 0112 05/07/16 0603  CALCIUM 9.4  < > 8.7*  < > 8.3* 8.7* 7.8*  MG 1.8  --  1.8  --   --   --   --   < > = values in this interval not displayed.  CBC  Recent Labs Lab 05/04/16 1247 05/06/16 0332   WBC 14.8* 16.6*  HGB 13.4 12.5  HCT 39.6 38.1  PLT 446* 393    Coag's No results for input(s): APTT, INR in the last 168 hours.  Sepsis Markers  Recent Labs Lab 05/04/16 1300 05/07/16 0112  LATICACIDVEN 1.56 1.1    ABG  Recent Labs Lab 05/06/16 1030 05/06/16 1500  PHART 6.982* 7.376  PCO2ART BELOW REPORTABLE RANGE 21.6*  PO2ART 135* 112*    Liver Enzymes  Recent Labs Lab 05/04/16 1247  AST 27  ALT 18  ALKPHOS 117  BILITOT 1.2  ALBUMIN 4.5    Cardiac Enzymes No results for input(s): TROPONINI, PROBNP in the last 168 hours.  Glucose  Recent Labs Lab 05/07/16 0411 05/07/16 0511 05/07/16 0608 05/07/16 0755 05/07/16 0859 05/07/16 1003  GLUCAP 150* 148* 146* 183* 150* 168*    Imaging CXR 05/05/15- no acute cardiopulmonary abnormality. I have reviewed the images personally.  STUDIES:    CULTURES:   ANTIBIOTICS:   SIGNIFICANT EVENTS: 4/16  Admit with n/v, DKA 4/18  PCCM consulted for worsening acidosis   LINES/TUBES:   DISCUSSION: 26 year old F admitted 4/16 with recurrent DKA.  Initially AG closed but opened again 4/18 and PCCM consulted for evaluation with concern for need of intubation.  The patient continues to have nausea / vomiting.  She was appropriately compensated in regards to PCO2 and it was felt we could not match her needs on mechanical ventilation.  Acidosis improved 4/19.       DKA Metabolic acidosis Plan: Continue insulin gtt  Continue aggressive hydration, D51/2NS at 182ml/hr  Completed 1L sodium bicarbonate  Follow electrolytes per DKA protocol  Monitor anion gap for closure  Hypokalemia  Plan: Replace K Monitor BMP   Sinus tachycardia - likely from volume depletion Plan: Monitor in SDU  Lactate normal   Diabetic gastroparesis Nausea / Vomiting  H/O pancreatitis. Normal lipase on admission Plan: NPO x ice chips  PRN phenergan  Control nausea / vomiting aggressively   Asthma - without acute  exacerbation Plan: Continue dulera, singulair   Leukocytosis - suspect stress response, no acute evidence of infection. Monitor WBC / fever curve    PCCM will sign off.  Please call back if new needs arise.    Noe Gens, NP-C [facility] Pgr: [phone] or if no answer [phone] 05/07/2016, 10:07 AM

## 2016-05-08 LAB — BASIC METABOLIC PANEL
Anion gap: 12 (ref 5–15)
Anion gap: 16 — ABNORMAL HIGH (ref 5–15)
Anion gap: 19 — ABNORMAL HIGH (ref 5–15)
Anion gap: 12 (ref 5–15)
Anion gap: 10 (ref 5–15)
Anion gap: 11 (ref 5–15)
BUN: 5 mg/dL — AB (ref 6–20)
BUN: 5 mg/dL — AB (ref 6–20)
CALCIUM: 8.4 mg/dL — AB (ref 8.9–10.3)
CALCIUM: 8.4 mg/dL — AB (ref 8.9–10.3)
CHLORIDE: 104 mmol/L (ref 101–111)
CO2: 22 mmol/L (ref 22–32)
CO2: 16 mmol/L — ABNORMAL LOW (ref 22–32)
CO2: 12 mmol/L — ABNORMAL LOW (ref 22–32)
CO2: 15 mmol/L — ABNORMAL LOW (ref 22–32)
CO2: 17 mmol/L — ABNORMAL LOW (ref 22–32)
CO2: 19 mmol/L — ABNORMAL LOW (ref 22–32)
CREATININE: 0.5 mg/dL (ref 0.44–1.00)
CREATININE: 0.51 mg/dL (ref 0.44–1.00)
CREATININE: 0.62 mg/dL (ref 0.44–1.00)
CREATININE: 0.79 mg/dL (ref 0.44–1.00)
CREATININE: 0.58 mg/dL (ref 0.44–1.00)
CREATININE: 0.58 mg/dL (ref 0.44–1.00)
Calcium: 8.5 mg/dL — ABNORMAL LOW (ref 8.9–10.3)
Calcium: 8 mg/dL — ABNORMAL LOW (ref 8.9–10.3)
Calcium: 8.3 mg/dL — ABNORMAL LOW (ref 8.9–10.3)
Calcium: 8.4 mg/dL — ABNORMAL LOW (ref 8.9–10.3)
Chloride: 105 mmol/L (ref 101–111)
Chloride: 103 mmol/L (ref 101–111)
Chloride: 103 mmol/L (ref 101–111)
Chloride: 111 mmol/L (ref 101–111)
Chloride: 109 mmol/L (ref 101–111)
GFR calc Af Amer: >60 mL/min (ref >60)
GFR calc Af Amer: >60 mL/min (ref >60)
GFR calc Af Amer: >60 mL/min (ref >60)
GFR calc Af Amer: >60 mL/min (ref >60)
GFR calc Af Amer: >60 mL/min (ref >60)
GFR calc non Af Amer: >60 mL/min (ref >60)
GLUCOSE: 280 mg/dL — AB (ref 65–99)
Glucose, Bld: 214 mg/dL — ABNORMAL HIGH (ref 65–99)
Glucose, Bld: 391 mg/dL — ABNORMAL HIGH (ref 65–99)
Glucose, Bld: 146 mg/dL — ABNORMAL HIGH (ref 65–99)
Glucose, Bld: 106 mg/dL — ABNORMAL HIGH (ref 65–99)
Glucose, Bld: 90 mg/dL (ref 65–99)
POTASSIUM: 3.7 mmol/L (ref 3.5–5.1)
Potassium: 2.8 mmol/L — ABNORMAL LOW (ref 3.5–5.1)
Potassium: 3.2 mmol/L — ABNORMAL LOW (ref 3.5–5.1)
Potassium: 3.1 mmol/L — ABNORMAL LOW (ref 3.5–5.1)
Potassium: 3.3 mmol/L — ABNORMAL LOW (ref 3.5–5.1)
Potassium: 3.2 mmol/L — ABNORMAL LOW (ref 3.5–5.1)
SODIUM: 134 mmol/L — AB (ref 135–145)
SODIUM: 134 mmol/L — AB (ref 135–145)
SODIUM: 136 mmol/L (ref 135–145)
SODIUM: 138 mmol/L (ref 135–145)
SODIUM: 139 mmol/L (ref 135–145)
SODIUM: 135 mmol/L (ref 135–145)

## 2016-05-08 LAB — GLUCOSE, CAPILLARY
GLUCOSE-CAPILLARY: 344 mg/dL — AB (ref 65–99)
GLUCOSE-CAPILLARY: 104 mg/dL — AB (ref 65–99)
Glucose-Capillary: 185 mg/dL — ABNORMAL HIGH (ref 65–99)
Glucose-Capillary: 122 mg/dL — ABNORMAL HIGH (ref 65–99)
Glucose-Capillary: 107 mg/dL — ABNORMAL HIGH (ref 65–99)

## 2016-05-08 MED ORDER — SODIUM CHLORIDE 0.9 % IV BOLUS (SEPSIS)
1000.0000 mL | Freq: Once | INTRAVENOUS | Status: AC
  Administered 2016-05-08: 1000 mL via INTRAVENOUS

## 2016-05-08 MED ORDER — INSULIN GLARGINE 100 UNIT/ML ~~LOC~~ SOLN
25.0000 [IU] | Freq: Every day | SUBCUTANEOUS | Status: DC
  Administered 2016-05-08 – 2016-05-10 (×3): 25 [IU] via SUBCUTANEOUS
  Filled 2016-05-08 (×3): qty 0.25

## 2016-05-08 MED ORDER — HYDRALAZINE HCL 20 MG/ML IJ SOLN: 5.0000 mg | Freq: Four times a day (QID) | INTRAMUSCULAR | Status: DC | PRN

## 2016-05-08 MED ORDER — INSULIN GLARGINE 100 UNIT/ML ~~LOC~~ SOLN: 25.0000 [IU] | Freq: Every day | SUBCUTANEOUS | Status: DC

## 2016-05-08 MED ORDER — INSULIN ASPART 100 UNIT/ML ~~LOC~~ SOLN
0.0000 [IU] | Freq: Every day | SUBCUTANEOUS | Status: DC
  Administered 2016-05-09: 2 [IU] via SUBCUTANEOUS

## 2016-05-08 MED ORDER — METOPROLOL TARTRATE 25 MG PO TABS
12.5000 mg | ORAL_TABLET | Freq: Two times a day (BID) | ORAL | Status: DC
  Administered 2016-05-08 – 2016-05-10 (×4): 12.5 mg via ORAL
  Filled 2016-05-08 (×4): qty 1

## 2016-05-08 MED ORDER — POTASSIUM CHLORIDE CRYS ER 20 MEQ PO TBCR
40.0000 meq | EXTENDED_RELEASE_TABLET | Freq: Once | ORAL | Status: DC
  Filled 2016-05-08: qty 2

## 2016-05-08 MED ORDER — METOPROLOL TARTRATE 5 MG/5ML IV SOLN
5.0000 mg | Freq: Once | INTRAVENOUS | Status: AC
  Administered 2016-05-08: 5 mg via INTRAVENOUS
  Filled 2016-05-08: qty 5

## 2016-05-08 MED ORDER — INSULIN ASPART 100 UNIT/ML ~~LOC~~ SOLN
0.0000 [IU] | Freq: Three times a day (TID) | SUBCUTANEOUS | Status: DC
  Administered 2016-05-08: 2 [IU] via SUBCUTANEOUS
  Administered 2016-05-08: 11 [IU] via SUBCUTANEOUS
  Administered 2016-05-09: 5 [IU] via SUBCUTANEOUS
  Administered 2016-05-09: 7 [IU] via SUBCUTANEOUS
  Administered 2016-05-09: 3 [IU] via SUBCUTANEOUS
  Administered 2016-05-10: 5 [IU] via SUBCUTANEOUS

## 2016-05-08 MED ORDER — POTASSIUM CHLORIDE 10 MEQ/100ML IV SOLN
10.0000 meq | INTRAVENOUS | Status: AC
  Administered 2016-05-08 (×3): 10 meq via INTRAVENOUS
  Filled 2016-05-08 (×3): qty 100

## 2016-05-08 MED ORDER — SODIUM CHLORIDE 0.9 % IV SOLN: 30.0000 meq | Freq: Once | INTRAVENOUS | Status: DC

## 2016-05-08 MED ORDER — HYDRALAZINE HCL 20 MG/ML IJ SOLN
5.0000 mg | Freq: Once | INTRAMUSCULAR | Status: AC
  Administered 2016-05-08: 5 mg via INTRAVENOUS
  Filled 2016-05-08: qty 1

## 2016-05-08 NOTE — Progress Notes (Signed)
Inpatient Diabetes Program Recommendations  AACE/ADA: New Consensus Statement on Inpatient Glycemic Control (2015)  Target Ranges:  Prepandial:   less than 140 mg/dL      Peak postprandial:   less than 180 mg/dL (1-2 hours)      Critically ill patients:  140 - 180 mg/dL   Lab Results  Component Value Date   GLUCAP 185 (H) 05/08/2016   HGBA1C 8.9 (H) 03/01/2016    Review of Glycemic Control  Per labs - appears pt is back in DKA. CO2 - 12   AG - 19.  Inpatient Diabetes Program Recommendations:   IV insulin per DKA order set.  Thank you. Lorenda Peck, RD, LDN, CDE Inpatient Diabetes Coordinator [phone]

## 2016-05-08 NOTE — Progress Notes (Signed)
Inpatient Diabetes Program Recommendations  AACE/ADA: New Consensus Statement on Inpatient Glycemic Control (2015)  Target Ranges:  Prepandial:   less than 140 mg/dL      Peak postprandial:   less than 180 mg/dL (1-2 hours)      Critically ill patients:  140 - 180 mg/dL   Lab Results  Component Value Date   GLUCAP 122 (H) 05/08/2016   HGBA1C 8.9 (H) 03/01/2016    Review of Glycemic Control  Concern for hypoglycemia with Lantus 25 units.   Inpatient Diabetes Program Recommendations:     Decrease Novolog to 0-9 units tidwc and hs (Type 1 and sensitive to insulin) Add Novolog 4 units tidwc (HOLD if pt eats <50% meal) May need to reduce Lantus to 22 units if hypoglycemia occurs in am.   Continue to follow.  Thank you. Lorenda Peck, RD, LDN, CDE Inpatient Diabetes Coordinator [phone]

## 2016-05-08 NOTE — Progress Notes (Signed)
Patient has not eaten all day; but she has had fluids.  Pt c/o nausea and abdominal pain

## 2016-05-08 NOTE — Progress Notes (Signed)
Date:  May 08, 2016 Chart reviewed for concurrent status and case management needs. Will continue to follow patient progress. Discharge Planning: following for needs Expected discharge date: [id number] Brittnye Josephs, BSN, RN3, CCM   [phone] 

## 2016-05-08 NOTE — Progress Notes (Signed)
PROGRESS NOTE    Leonie Amacher  ZOX:[id number] DOB: 04/02/1990 DOA: 05/04/2016 PCP: Vicenta Aly, FNP   Brief Narrative: [name] is a 26 y.o. female with a history of DM, type 1, pancreatitis, liver adenomas, anxiety, asthma, hepatic steatosis. She presented in DKA. She was transitioned to subcutaneous insulin on 4/17 but reverted back to DKA on 4/18.   Assessment & Plan:   Active Problems:   DKA, type 1 (HCC)   Sinus tachycardia   Chronic diastolic heart failure (HCC)   Diabetic gastroparesis ([place])   Uncontrolled type 1 diabetes mellitus with hyperglycemia (HCC)   Gastroparesis due to DM ([place])  DKA Diabetes mellitus, type 1 Patient has returned to DKA twice, now. Will need to transition to subcutaneous based on insulin requirements rather than previous regimen. No evidence of infection or other stressors that could be complicating this. She does have abdominal pain which has been evaluated in the past without a cause found.  -Lantus 25 units this morning -repeat BMP, if still worsening, start insulin drip  Sinus tachycardia Likely secondary to DKA and overall dehydrated state -fluids  Diabetic gastroparesis Stable -continue Reglan  Urinary retention Acute. Unknown etiology. Possibly secondary to medication effect. Foley placed -continue foley for now  DVT prophylaxis: Lovenox Code Status: Full code Family Communication: None at bedside Disposition Plan: Discharge home when medically stable   Consultants:   PCCM  Procedures:   ABG (4/18)  Antimicrobials:  None    Subjective: Nausea without emesis. She reports generalized abdominal pain with no other symptoms. No chest pain or palpitations.  Objective: Vitals:   05/08/16 0500 05/08/16 0800 05/08/16 0900 05/08/16 1000  BP: (!) 160/111 (!) 136/101  135/83  Pulse: (!) 126 (!) 134 (!) 145 (!) 147  Resp: (!) 21 (!) 22 (!) 22 (!) 23  Temp:   98.1 F (36.7 C)   TempSrc:   Oral   SpO2: 98%  99% 99% 99%  Weight:      Height:        Intake/Output Summary (Last 24 hours) at 05/08/16 1134 Last data filed at 05/08/16 0800  Gross per 24 hour  Intake           2845.4 ml  Output             3375 ml  Net           -529.6 ml   Filed Weights   05/04/16 2200 05/07/16 0528  Weight: 55.6 kg (122 lb 9.2 oz) 50.8 kg (111 lb 15.9 oz)    Examination:  General exam: Appears calm and comfortable  Respiratory system: Clear to auscultation. Respiratory effort normal with tachypnea. Cardiovascular system: S1 & S2 heard, tachycardia, normal rhythm. Gastrointestinal system: Abdomen is nondistended, soft and nontender. Normal bowel sounds heard. Central nervous system: Alert and oriented. No focal neurological deficits. Extremities: No edema. No calf tenderness Skin: No cyanosis. No rashes Psychiatry: Judgement and insight appear normal. Mood & affect appropriate.     Data Reviewed: I have personally reviewed following labs and imaging studies  CBC:  Recent Labs Lab 05/04/16 1247 05/06/16 0332  WBC 14.8* 16.6*  HGB 13.4 12.5  HCT 39.6 38.1  MCV 89.8 92.3  PLT 446* 811   Basic Metabolic Panel:  Recent Labs Lab 05/04/16 2208  05/06/16 0332  05/07/16 1550 05/07/16 1931 05/07/16 2351 05/08/16 0337 05/08/16 0811  NA 140  < > 135  < > 137 136 139 135 134*  K 4.3  < > 4.5  < >  2.6* 2.7* 2.8* 3.2* 3.7  CL 109  < > 109  < > 107 106 105 103 103  CO2 13*  < > 8*  < > 23 23 22  16* 12*  GLUCOSE 223*  < > 452*  < > 138* 216* 214* 280* 391*  BUN 14  < > 10  < > <5* <5* <5* <5* 5*  CREATININE 0.75  < > 0.75  < > 0.41* 0.42* 0.58 0.58 0.79  CALCIUM 9.4  < > 8.7*  < > 8.3* 8.0* 8.4* 8.4* 8.5*  MG 1.8  --  1.8  --   --   --   --   --   --   < > = values in this interval not displayed. GFR: Estimated Creatinine Clearance: 85.5 mL/min (by C-G formula based on SCr of 0.79 mg/dL). Liver Function Tests:  Recent Labs Lab 05/04/16 1247  AST 27  ALT 18  ALKPHOS 117  BILITOT 1.2    PROT 8.5*  ALBUMIN 4.5    Recent Labs Lab 05/04/16 1247  LIPASE 14   No results for input(s): AMMONIA in the last 168 hours. Coagulation Profile: No results for input(s): INR, PROTIME in the last 168 hours. Cardiac Enzymes: No results for input(s): CKTOTAL, CKMB, CKMBINDEX, TROPONINI in the last 168 hours. BNP (last 3 results) No results for input(s): PROBNP in the last 8760 hours. HbA1C: No results for input(s): HGBA1C in the last 72 hours. CBG:  Recent Labs Lab 05/07/16 1539 05/07/16 1634 05/07/16 2126 05/08/16 0804 05/08/16 1043  GLUCAP 133* 133* 270* 344* 185*   Lipid Profile: No results for input(s): CHOL, HDL, LDLCALC, TRIG, CHOLHDL, LDLDIRECT in the last 72 hours. Thyroid Function Tests: No results for input(s): TSH, T4TOTAL, FREET4, T3FREE, THYROIDAB in the last 72 hours. Anemia Panel: No results for input(s): VITAMINB12, FOLATE, FERRITIN, TIBC, IRON, RETICCTPCT in the last 72 hours. Sepsis Labs:  Recent Labs Lab 05/04/16 1300 05/07/16 0112  LATICACIDVEN 1.56 1.1    Recent Results (from the past 240 hour(s))  Urine culture     Status: Abnormal   Collection Time: 05/04/16  5:45 PM  Result Value Ref Range Status   Specimen Description URINE, RANDOM  Final   Special Requests NONE  Final   Culture MULTIPLE SPECIES PRESENT, SUGGEST RECOLLECTION (A)  Final   Report Status 05/06/2016 FINAL  Final  MRSA PCR Screening     Status: None   Collection Time: 05/04/16 11:00 PM  Result Value Ref Range Status   MRSA by PCR NEGATIVE NEGATIVE Final    Comment:        The GeneXpert MRSA Assay (FDA approved for NASAL specimens only), is one component of a comprehensive MRSA colonization surveillance program. It is not intended to diagnose MRSA infection nor to guide or monitor treatment for MRSA infections.          Radiology Studies: No results found.      Scheduled Meds: . enoxaparin (LOVENOX) injection  40 mg Subcutaneous Q24H  . escitalopram   5 mg Oral Daily  . insulin aspart  0-15 Units Subcutaneous TID WC  . insulin aspart  0-5 Units Subcutaneous QHS  . insulin glargine  25 Units Subcutaneous Daily  . metoCLOPramide (REGLAN) injection  5 mg Intravenous TID AC & HS  . mometasone-formoterol  2 puff Inhalation BID  . montelukast  10 mg Oral Daily  . Vitamin D (Ergocalciferol)  50,000 Units Oral Q7 days   Continuous Infusions: . sodium chloride 100  mL/hr at 05/07/16 1835  . sodium chloride       LOS: 4 days     Cordelia Poche, MD [facility] 05/08/2016, 11:34 AM Pager: [phone]  If 7PM-7AM, please contact night-coverage [email or web address] Password [facility] 05/08/2016, 11:34 AM

## 2016-05-09 ENCOUNTER — Inpatient Hospital Stay (HOSPITAL_COMMUNITY): Payer: Medicaid Other

## 2016-05-09 LAB — BASIC METABOLIC PANEL
Anion gap: 8 (ref 5–15)
Anion gap: 7 (ref 5–15)
Anion gap: 7 (ref 5–15)
Anion gap: 7 (ref 5–15)
Anion gap: 6 (ref 5–15)
BUN: 5 mg/dL — AB (ref 6–20)
BUN: 6 mg/dL (ref 6–20)
BUN: 6 mg/dL (ref 6–20)
BUN: 6 mg/dL (ref 6–20)
BUN: 8 mg/dL (ref 6–20)
CHLORIDE: 104 mmol/L (ref 101–111)
CHLORIDE: 106 mmol/L (ref 101–111)
CHLORIDE: 106 mmol/L (ref 101–111)
CHLORIDE: 106 mmol/L (ref 101–111)
CHLORIDE: 107 mmol/L (ref 101–111)
CO2: 20 mmol/L — ABNORMAL LOW (ref 22–32)
CO2: 20 mmol/L — AB (ref 22–32)
CO2: 21 mmol/L — ABNORMAL LOW (ref 22–32)
CO2: 22 mmol/L (ref 22–32)
CO2: 23 mmol/L (ref 22–32)
Calcium: 8.5 mg/dL — ABNORMAL LOW (ref 8.9–10.3)
Calcium: 8.4 mg/dL — ABNORMAL LOW (ref 8.9–10.3)
Calcium: 8.4 mg/dL — ABNORMAL LOW (ref 8.9–10.3)
Calcium: 8.5 mg/dL — ABNORMAL LOW (ref 8.9–10.3)
Calcium: 8.7 mg/dL — ABNORMAL LOW (ref 8.9–10.3)
Creatinine, Ser: 0.54 mg/dL (ref 0.44–1.00)
Creatinine, Ser: 0.49 mg/dL (ref 0.44–1.00)
Creatinine, Ser: 0.54 mg/dL (ref 0.44–1.00)
Creatinine, Ser: 0.5 mg/dL (ref 0.44–1.00)
Creatinine, Ser: 0.52 mg/dL (ref 0.44–1.00)
GFR calc Af Amer: >60 mL/min (ref >60)
GFR calc Af Amer: >60 mL/min (ref >60)
GFR calc Af Amer: >60 mL/min (ref >60)
GFR calc Af Amer: >60 mL/min (ref >60)
GFR calc Af Amer: >60 mL/min (ref >60)
GFR calc non Af Amer: >60 mL/min (ref >60)
GFR calc non Af Amer: >60 mL/min (ref >60)
GFR calc non Af Amer: >60 mL/min (ref >60)
GFR calc non Af Amer: >60 mL/min (ref >60)
GFR calc non Af Amer: >60 mL/min (ref >60)
GLUCOSE: 283 mg/dL — AB (ref 65–99)
GLUCOSE: 293 mg/dL — AB (ref 65–99)
GLUCOSE: 315 mg/dL — AB (ref 65–99)
GLUCOSE: 228 mg/dL — AB (ref 65–99)
Glucose, Bld: 189 mg/dL — ABNORMAL HIGH (ref 65–99)
POTASSIUM: 4 mmol/L (ref 3.5–5.1)
POTASSIUM: 3.8 mmol/L (ref 3.5–5.1)
POTASSIUM: 3.6 mmol/L (ref 3.5–5.1)
POTASSIUM: 3.4 mmol/L — AB (ref 3.5–5.1)
POTASSIUM: 3.9 mmol/L (ref 3.5–5.1)
SODIUM: 135 mmol/L (ref 135–145)
SODIUM: 136 mmol/L (ref 135–145)
Sodium: 132 mmol/L — ABNORMAL LOW (ref 135–145)
Sodium: 133 mmol/L — ABNORMAL LOW (ref 135–145)
Sodium: 134 mmol/L — ABNORMAL LOW (ref 135–145)

## 2016-05-09 LAB — GLUCOSE, CAPILLARY
GLUCOSE-CAPILLARY: 255 mg/dL — AB (ref 65–99)
GLUCOSE-CAPILLARY: 262 mg/dL — AB (ref 65–99)
GLUCOSE-CAPILLARY: 199 mg/dL — AB (ref 65–99)
Glucose-Capillary: 249 mg/dL — ABNORMAL HIGH (ref 65–99)

## 2016-05-09 MED ORDER — FAMOTIDINE 20 MG PO TABS
20.0000 mg | ORAL_TABLET | Freq: Every day | ORAL | Status: DC
  Administered 2016-05-09: 20 mg via ORAL
  Filled 2016-05-09: qty 1

## 2016-05-09 MED ORDER — POLYETHYLENE GLYCOL 3350 17 G PO PACK
17.0000 g | PACK | Freq: Two times a day (BID) | ORAL | Status: DC
  Administered 2016-05-09 (×2): 17 g via ORAL
  Filled 2016-05-09 (×2): qty 1

## 2016-05-09 NOTE — Progress Notes (Signed)
PROGRESS NOTE    [name]  RKY:[id number] DOB: 09-24-1990 DOA: 05/04/2016 PCP: Vicenta Aly, FNP   Brief Narrative: [name] is a 26 y.o. female with a history of DM, type 1, pancreatitis, liver adenomas, anxiety, asthma, hepatic steatosis. She presented in DKA. She was transitioned to subcutaneous insulin on 4/17 but reverted back to DKA on 4/18.   Assessment & Plan:   Active Problems:   DKA, type 1 (HCC)   Sinus tachycardia   Chronic diastolic heart failure (HCC)   Diabetic gastroparesis ([place])   Uncontrolled type 1 diabetes mellitus with hyperglycemia (HCC)   Gastroparesis due to DM ([place])  DKA Diabetes mellitus, type 1 Patient has returned to DKA twice, now. Will need to transition to subcutaneous based on insulin requirements rather than previous regimen. No evidence of infection or other stressors that could be complicating this. Improved without restarting drip a third time. -continue Lantus 25 units -SSI AC & qHS  Sinus tachycardia Likely secondary to DKA and overall dehydrated state -continue fluids  Diabetic gastroparesis Stable -continue Reglan  Urinary retention Acute. Unknown etiology. Possibly secondary to medication effect. Foley placed for this. On chart review, patient a chronic history of retention and bladder distension. She has never been worked up for this.  -discontinue foley, void trial -outpatient urology follow-up  DVT prophylaxis: Lovenox Code Status: Full code Family Communication: None at bedside Disposition Plan: Transfer to telemetry. Discharge home when medically stable   Consultants:   PCCM  Procedures:   ABG (4/18)  Antimicrobials:  None    Subjective: No nausea, vomiting, or abdominal pain today.  Objective: Vitals:   05/09/16 0324 05/09/16 0400 05/09/16 0600 05/09/16 0800  BP:  115/71 (!) 132/95 (!) 133/92  Pulse:  (!) 102 100 99  Resp:   18 14  Temp: 98.3 F (36.8 C)   98.3 F (36.8 C)    TempSrc: Oral   Oral  SpO2:  98% 100% 100%  Weight:      Height:        Intake/Output Summary (Last 24 hours) at 05/09/16 0849 Last data filed at 05/09/16 0600  Gross per 24 hour  Intake             2000 ml  Output             2655 ml  Net             -655 ml   Filed Weights   05/04/16 2200 05/07/16 0528  Weight: 55.6 kg (122 lb 9.2 oz) 50.8 kg (111 lb 15.9 oz)    Examination:  General exam: Appears calm and comfortable  Respiratory system: Clear to auscultation. Respiratory effort normal. Cardiovascular system: S1 & S2 heard, normal rate, regular rhythm. Gastrointestinal system: Abdomen is nondistended, soft and nontender. Normal bowel sounds heard. Central nervous system: Alert and oriented. No focal neurological deficits. Extremities: No edema. No calf tenderness Skin: No cyanosis. No rashes Psychiatry: Judgement and insight appear normal. Mood & affect appropriate.     Data Reviewed: I have personally reviewed following labs and imaging studies  CBC:  Recent Labs Lab 05/04/16 1247 05/06/16 0332  WBC 14.8* 16.6*  HGB 13.4 12.5  HCT 39.6 38.1  MCV 89.8 92.3  PLT 446* 315   Basic Metabolic Panel:  Recent Labs Lab 05/04/16 2208  05/06/16 0332  05/08/16 1157 05/08/16 1527 05/08/16 1918 05/09/16 0403 05/09/16 0733  NA 140  < > 135  < > 138 136 134* 132* 133*  K  4.3  < > 4.5  < > 3.1* 3.3* 3.2* 4.0 3.8  CL 109  < > 109  < > 111 109 104 104 106  CO2 13*  < > 8*  < > 15* 17* 19* 20* 20*  GLUCOSE 223*  < > 452*  < > 146* 106* 90 283* 293*  BUN 14  < > 10  < > 5* <5* <5* 5* 6  CREATININE 0.75  < > 0.75  < > 0.62 0.51 0.50 0.54 0.49  CALCIUM 9.4  < > 8.7*  < > 8.0* 8.3* 8.4* 8.5* 8.4*  MG 1.8  --  1.8  --   --   --   --   --   --   < > = values in this interval not displayed. GFR: Estimated Creatinine Clearance: 85.5 mL/min (by C-G formula based on SCr of 0.49 mg/dL). Liver Function Tests:  Recent Labs Lab 05/04/16 1247  AST 27  ALT 18  ALKPHOS 117   BILITOT 1.2  PROT 8.5*  ALBUMIN 4.5    Recent Labs Lab 05/04/16 1247  LIPASE 14   CBG:  Recent Labs Lab 05/08/16 1043 05/08/16 1150 05/08/16 1608 05/08/16 2147 05/09/16 0732  GLUCAP 185* 122* 104* 107* 255*   Sepsis Labs:  Recent Labs Lab 05/04/16 1300 05/07/16 0112  LATICACIDVEN 1.56 1.1    Recent Results (from the past 240 hour(s))  Urine culture     Status: Abnormal   Collection Time: 05/04/16  5:45 PM  Result Value Ref Range Status   Specimen Description URINE, RANDOM  Final   Special Requests NONE  Final   Culture MULTIPLE SPECIES PRESENT, SUGGEST RECOLLECTION (A)  Final   Report Status 05/06/2016 FINAL  Final  MRSA PCR Screening     Status: None   Collection Time: 05/04/16 11:00 PM  Result Value Ref Range Status   MRSA by PCR NEGATIVE NEGATIVE Final    Comment:        The GeneXpert MRSA Assay (FDA approved for NASAL specimens only), is one component of a comprehensive MRSA colonization surveillance program. It is not intended to diagnose MRSA infection nor to guide or monitor treatment for MRSA infections.          Radiology Studies: No results found.      Scheduled Meds: . enoxaparin (LOVENOX) injection  40 mg Subcutaneous Q24H  . escitalopram  5 mg Oral Daily  . famotidine  20 mg Oral QHS  . insulin aspart  0-15 Units Subcutaneous TID WC  . insulin aspart  0-5 Units Subcutaneous QHS  . insulin glargine  25 Units Subcutaneous Daily  . metoCLOPramide (REGLAN) injection  5 mg Intravenous TID AC & HS  . metoprolol tartrate  12.5 mg Oral BID  . mometasone-formoterol  2 puff Inhalation BID  . montelukast  10 mg Oral Daily  . Vitamin D (Ergocalciferol)  50,000 Units Oral Q7 days   Continuous Infusions: . sodium chloride 100 mL/hr at 05/08/16 1844     LOS: 5 days     Cordelia Poche, MD [facility] 05/09/2016, 8:49 AM Pager: [phone]  If 7PM-7AM, please contact night-coverage [email or web address] Password  [facility] 05/09/2016, 8:49 AM

## 2016-05-09 NOTE — Progress Notes (Signed)
Received transfer from stepdown.  Pt stable.  BP 126/81 (BP Location: Left Arm)   Pulse 95   Temp 98 F (36.7 C) (Oral)   Resp 17   Ht 5\' 4"  (1.626 m)   Wt 56.3 kg (124 lb 1.9 oz)   LMP 04/19/2016 Comment: negative preg test 05/04/16.  SpO2 100%   BMI 21.30 kg/m . Due to void by 4pm.  Denies pain.  Abdomen soft, non-tender with audible bowel sounds.  Skin intact.  Will continue with current plan of care.

## 2016-05-10 DIAGNOSIS — R339 Retention of urine, unspecified: Secondary | ICD-10-CM

## 2016-05-10 DIAGNOSIS — R1084 Generalized abdominal pain: Secondary | ICD-10-CM

## 2016-05-10 LAB — GLUCOSE, CAPILLARY
GLUCOSE-CAPILLARY: 186 mg/dL — AB (ref 65–99)
Glucose-Capillary: 209 mg/dL — ABNORMAL HIGH (ref 65–99)

## 2016-05-10 MED ORDER — UNABLE TO FIND: 0 refills | Status: DC

## 2016-05-10 MED ORDER — POLYETHYLENE GLYCOL 3350 17 G PO PACK: 17.0000 g | PACK | Freq: Every day | ORAL | 0 refills | Status: DC

## 2016-05-10 MED ORDER — METOCLOPRAMIDE HCL 10 MG PO TABS
10.0000 mg | ORAL_TABLET | Freq: Three times a day (TID) | ORAL | Status: DC
  Administered 2016-05-10: 10 mg via ORAL
  Filled 2016-05-10: qty 1

## 2016-05-10 MED ORDER — METOPROLOL TARTRATE 25 MG PO TABS: 12.5000 mg | ORAL_TABLET | Freq: Two times a day (BID) | ORAL | 0 refills | Status: DC

## 2016-05-10 MED ORDER — INSULIN ASPART 100 UNIT/ML ~~LOC~~ SOLN: 5.0000 [IU] | Freq: Three times a day (TID) | SUBCUTANEOUS | Status: DC

## 2016-05-10 NOTE — Discharge Instructions (Signed)
[name],  You were admitted because you developed DKA. This was treated with insulin. Please follow-up with your primary care physician and endocrinologist. You were started on a betablocker because of your heart rate and blood pressure. This can likely be discontinued as an outpatient. Please follow-up with your primary care with regard to this issue. You also appear to have a chronic history of issues with your bladder. This may be secondary to diabetes. You required a foley catheter which has been removed. Please follow-up with the Urologist. No changes have been made to your insulin. Please seek medical attention sooner if you are having issues with taking your insulin.

## 2016-05-10 NOTE — Progress Notes (Signed)
Pt d/c instruction given. Pt verbalized understanding. Discussed urinary retention and need for I/o cath.discuss s/sx of  Of infection. Introduction on how to I/O. Pt reports that she has  Done self cath in the past  Cath by CM. Pt verbalized understanding. Pt d/c home with mom. Condition stable

## 2016-05-10 NOTE — Care Management Note (Addendum)
Case Management Note  Patient Details  Name: [name] MRN: [id number] Date of Birth: Jan 30, 1990  Subjective/Objective:     Type I DM, DKA,  Nausea, vomiting             Action/Plan: Discharge Planning: NCM spoke to pt and states she has done the I/O cath at home. States she is familiar with [place] supplies and tried to get an Insulin Pump in the past but insurance did not cover. States she plans to follow up with her Endocrinologist, Dr Hartford Poli to start the process again for getting and Insulin Pump. She reports having a few I/O cath kits at home. Contacted attending for Rx for I/O cath kits and pt can follow up with [facility] to purchase. Provided pt with contact number for [facility].    Faxed Rx for I/O cath to [facility] with consent to [employer] form, and facesheet. Pt has number and original Rx.   PCP Vicenta Aly MD Expected Discharge Date:  05/10/2016             Expected Discharge Plan:  Home/Self Care  In-House Referral:  NA  Discharge planning Services  CM Consult  Post Acute Care Choice:  NA Choice offered to:  NA  DME Arranged:  N/A DME Agency:  Other - Comment  HH Arranged:  NA HH Agency:  NA  Status of Service:  Completed, signed off  If discussed at [place] of Stay Meetings, dates discussed:    Additional Comments:  Erenest Rasher, RN 05/10/2016, 3:05 PM

## 2016-05-10 NOTE — Discharge Summary (Signed)
Physician Discharge Summary  [name] SWF:[id number] DOB: 13-Feb-1990 DOA: 05/04/2016  PCP: Stefanie Aly, FNP  Admit date: 05/04/2016 Discharge date: 05/10/2016  Admitted From: Home Disposition: Home  Recommendations for Outpatient Follow-up:  1. Follow up with PCP in 1 week 2. Follow up with Urology 3. Tight A1C control  Home Health: None Equipment/Devices: None  Discharge Condition: Stable CODE STATUS: Full code Diet recommendation: Carb modified   Brief/Interim Summary:  Admission HPI written by Stefanie Eva, MD   Chief Complaint: n/v abdominal pain  HPI:  [name] is a 26 y.o. female with medical history of diabetes mellitus type 1, recent pancreatitis, liver adenomas, anxiety, hepatic steatosis, asthma presented with 1day history of N/V and abdominal pain.  She denied any fevers, chills, chest pain, short of breath, headache, neck pain, dysuria, hematuria. Notably, the patient was recently discharged from the hospital on 03/04/16 after treatment for DKA.  The patient endorses compliance with her insulin and all her other medications. She denies any recent sick contacts. She has been using cannabis, but denies any other illegal drug use.  In the emergency department, the patient was afebrile and hemodynamically stable with oxygen saturation 100% on room air. She was tachycardic with a heart rate 115-120.  Hepatic enzymes were unremarkable. BMP showed serum glucose of 277 with anion gap of 20. WBC was 14.8, hemoglobin 13.4, platelets 446,000.  Lactic acid was 1.56.  The patient has had numerous admissions in the past 6 months secondary to DKA as well as for N/V from presumptive gastroparesis. During her admissions, it has been characteristic for slow recovery due to slow recovering metabolic acidosis which is initially gapped and subsequently nongapped. In addition, her admissions have been characterized by profound volume depletion requiring significant fluid  resuscitation which subsequently resulted in improvement in the patient's sinus tachycardia and metabolic acidosis. Finally, her previous admissions have been characterized by opioid dependence for her "abdominal pain" which has resulted in exacerbation of her presumed gastroparesis resulting in slow recovery of her nausea and vomiting. However, when the patient's opioids are decreased in dose and frequency, the patient's vomiting improves. In addition when the patient's opioids are decreased, the patient has had a propensity to leave [place] in the past.    Hospital course:  DKA Diabetes mellitus, type 1 Patient initially managed with insulin drip and transitioned to subcutaneous insulin when gap had closed, however, bicarb was still low. Patient slid back into DKA on 4/18 with an arterial pH of 6.982, undetectable bicarb with anion gap unable to be calculated. Patient restarted on insulin drip, given 3 amps of bicarb and given a 1L bicarb drip. Repeat ABG was significant for resolved acidemia. Acidosis and anion gap improved and patient was once again transitioned to subcutaneous insulin with Lantus 20 units. Patient's blood sugars started to worsen and gap opened up a second time. Lantus 25 units given with escalation of sliding scale protocol which allowed patient to not be placed back on insulin drip. Bicarbonate and anion gap resolved to baseline. Patient to be discharged on Lantus 24 units and Novolog carb coverage.  Sinus tachycardia Likely secondary to DKA and overall dehydrated state. Did not improve and patient started on low dose beta blocker. Will need follow-up as an outpatient.  Diabetic gastroparesis Patient with a flare. Continued Reglan with improvement of symptoms. Continue at discharge.  Urinary retention Acute. This has been a recurrent issue. Patient has had previous in and out catheterizations prior to admission. Bladder is chronically distended.  Would bet  on neurogenic bladder secondary to uncontrolled diabetes. Discussed with Urology and will see in clinic for voiding trial. Initially was going to discharge patient with indwelling foley, however, she has had a lot of experience with in and out catheterizations. Kidney function stable throughout admission.  Abdominal pain Secondary to gastroparesis vs constipation. Patient had a bowel movement with resolution of symptoms.   Discharge Diagnoses:  Active Problems:   DKA, type 1 (HCC)   Sinus tachycardia   Chronic diastolic heart failure (HCC)   Diabetic gastroparesis ([place])   Uncontrolled type 1 diabetes mellitus with hyperglycemia (HCC)   Gastroparesis due to DM [facility])    Discharge Instructions   Allergies as of 05/10/2016      Reactions   Peanut-containing Drug Products Swelling   Swelling of mouth, lips   Strawberry Extract Swelling   Swelling of mouth, lips   Ultram [tramadol] Itching      Medication List    STOP taking these medications   fluconazole 100 MG tablet Commonly known as:  DIFLUCAN   magnesium oxide 400 MG tablet Commonly known as:  MAG-OX   potassium chloride SA 20 MEQ tablet Commonly known as:  K-DUR,KLOR-CON     TAKE these medications   albuterol 108 (90 Base) MCG/ACT inhaler Commonly known as:  PROVENTIL HFA;VENTOLIN HFA Inhale 2 puffs into the lungs every 6 (six) hours as needed for shortness of breath.   albuterol (2.5 MG/3ML) 0.083% nebulizer solution Commonly known as:  PROVENTIL Take 2.5 mg by nebulization every 6 (six) hours as needed for shortness of breath.   BASAGLAR KWIKPEN 100 UNIT/ML Sopn Inject 0.25 mLs (25 Units total) into the skin at bedtime.   budesonide-formoterol 160-4.5 MCG/ACT inhaler Commonly known as:  SYMBICORT Inhale 2 puffs into the lungs daily.   escitalopram 10 MG tablet Commonly known as:  LEXAPRO Take 0.5 tablets (5 mg total) by mouth daily.   famotidine 20 MG tablet Commonly known as:  PEPCID Take 1 tablet  (20 mg total) by mouth at bedtime.   metoCLOPramide 10 MG tablet Commonly known as:  REGLAN Take 1 tablet (10 mg total) by mouth 3 (three) times daily before meals.   metoprolol tartrate 25 MG tablet Commonly known as:  LOPRESSOR Take 0.5 tablets (12.5 mg total) by mouth 2 (two) times daily.   NOVOLOG FLEXPEN 100 UNIT/ML FlexPen Generic drug:  insulin aspart 1 UNIT FOR EVERY 10 GRAMS CARB W/MEALS/SNACKS PLUS SLIDING SCALE: 1 UNIT GLUCOSE 50 ABOVE 150.TDD 20   pantoprazole 40 MG tablet Commonly known as:  PROTONIX Take 1 tablet (40 mg total) by mouth 2 (two) times daily.   polyethylene glycol packet Commonly known as:  MIRALAX / GLYCOLAX Take 17 g by mouth daily.      Follow-up Information    ANDERSON,TERESA, FNP. Schedule an appointment as soon as possible for a visit in 1 week(s).   Specialty:  Nurse Practitioner Contact information: [place] [address] [facility] Schedule an appointment as soon as possible for a visit in 1 week(s).   Contact information: [address]  [place] [address] [place] [address] [phone]          Allergies  Allergen Reactions  . Peanut-Containing Drug Products Swelling    Swelling of mouth, lips  . Strawberry Extract Swelling    Swelling of mouth, lips  . Ultram [Tramadol] Itching    Consultations:  None   Procedures/Studies: Dg Chest 2 View  Result Date: 05/04/2016 CLINICAL DATA:  Acute onset of nausea, vomiting and generalized abdominal tenderness. Dark emesis. Severe lethargy. Initial encounter. EXAM: CHEST  2 VIEW COMPARISON:  Chest radiograph from 02/29/2016 FINDINGS: The lungs are well-aerated. Pulmonary vascularity is at the upper limits of normal. There is no evidence of focal opacification, pleural effusion or pneumothorax. The heart is normal in size; the mediastinal contour is within normal limits. No acute osseous abnormalities are seen.  IMPRESSION: No acute cardiopulmonary process seen. Electronically Signed   By: Garald Balding M.D.   On: 05/04/2016 23:32   Dg Abd Portable 1v  Result Date: 05/09/2016 CLINICAL DATA:  Abdominal pain for 5 days. EXAM: PORTABLE ABDOMEN - 1 VIEW COMPARISON:  02/05/2016 FINDINGS: Gas is present throughout nondilated colon. Gas is also present in grossly nondistended stomach, and there is a small amount of small bowel gas. No dilated loops of bowel are seen to suggest obstruction. There is no evidence of intraperitoneal free air on this supine study. Visualized lung bases are clear. No acute osseous abnormality is seen. Right upper quadrant abdominal surgical clips are noted. IMPRESSION: Nonobstructed bowel gas pattern. Electronically Signed   By: Logan Bores M.D.   On: 05/09/2016 09:10       Subjective: No nausea, vomiting or abdominal pain.  Discharge Exam: Vitals:   05/09/16 2305 05/10/16 0450  BP: 129/81 112/76  Pulse: 94 95  Resp: 16 16  Temp: 99.1 F (37.3 C) 98.2 F (36.8 C)   Vitals:   05/09/16 1000 05/09/16 1157 05/09/16 2305 05/10/16 0450  BP: 117/79 126/81 129/81 112/76  Pulse: 98 95 94 95  Resp: 17 17 16 16   Temp:  98 F (36.7 C) 99.1 F (37.3 C) 98.2 F (36.8 C)  TempSrc:  Oral Oral Oral  SpO2: 99% 100% 100% 99%  Weight:  56.3 kg (124 lb 1.9 oz)    Height:  5\' 4"  (1.626 m)      General exam: Appears calm and comfortable  Respiratory system: Clear to auscultation. Respiratory effort normal. Cardiovascular system: S1 & S2 heard, normal rate, regular rhythm. Gastrointestinal system: Abdomen is nondistended, soft and nontender. Normal bowel sounds heard. Central nervous system: Alert and oriented. No focal neurological deficits. Extremities: No edema. No calf tenderness Skin: No cyanosis. No rashes Psychiatry: Judgement and insight appear normal. Mood & affect appropriate.   The results of significant diagnostics from this hospitalization (including imaging,  microbiology, ancillary and laboratory) are listed below for reference.     Microbiology: Recent Results (from the past 240 hour(s))  Urine culture     Status: Abnormal   Collection Time: 05/04/16  5:45 PM  Result Value Ref Range Status   Specimen Description URINE, RANDOM  Final   Special Requests NONE  Final   Culture MULTIPLE SPECIES PRESENT, SUGGEST RECOLLECTION (A)  Final   Report Status 05/06/2016 FINAL  Final  MRSA PCR Screening     Status: None   Collection Time: 05/04/16 11:00 PM  Result Value Ref Range Status   MRSA by PCR NEGATIVE NEGATIVE Final    Comment:        The GeneXpert MRSA Assay (FDA approved for NASAL specimens only), is one component of a comprehensive MRSA colonization surveillance program. It is not intended to diagnose MRSA infection nor to guide or monitor treatment for MRSA infections.      Labs: BNP (last 3 results)  Recent Labs  07/11/15 0353  BNP  6.9   Basic Metabolic Panel:  Recent Labs Lab 05/04/16 2208  05/06/16 0332  05/09/16 0403 05/09/16 0733 05/09/16 1235 05/09/16 1637 05/09/16 1930  NA 140  < > 135  < > 132* 133* 134* 135 136  K 4.3  < > 4.5  < > 4.0 3.8 3.6 3.4* 3.9  CL 109  < > 109  < > 104 106 106 106 107  CO2 13*  < > 8*  < > 20* 20* 21* 22 23  GLUCOSE 223*  < > 452*  < > 283* 293* 315* 228* 189*  BUN 14  < > 10  < > 5* 6 6 6 8   CREATININE 0.75  < > 0.75  < > 0.54 0.49 0.54 0.50 0.52  CALCIUM 9.4  < > 8.7*  < > 8.5* 8.4* 8.4* 8.5* 8.7*  MG 1.8  --  1.8  --   --   --   --   --   --   < > = values in this interval not displayed. Liver Function Tests:  Recent Labs Lab 05/04/16 1247  AST 27  ALT 18  ALKPHOS 117  BILITOT 1.2  PROT 8.5*  ALBUMIN 4.5    Recent Labs Lab 05/04/16 1247  LIPASE 14   No results for input(s): AMMONIA in the last 168 hours. CBC:  Recent Labs Lab 05/04/16 1247 05/06/16 0332  WBC 14.8* 16.6*  HGB 13.4 12.5  HCT 39.6 38.1  MCV 89.8 92.3  PLT 446* 393   Cardiac  Enzymes: No results for input(s): CKTOTAL, CKMB, CKMBINDEX, TROPONINI in the last 168 hours. BNP: Invalid input(s): POCBNP CBG:  Recent Labs Lab 05/09/16 1201 05/09/16 1633 05/09/16 2312 05/10/16 0743 05/10/16 1158  GLUCAP 262* 199* 249* 186* 209*   D-Dimer No results for input(s): DDIMER in the last 72 hours. Hgb A1c No results for input(s): HGBA1C in the last 72 hours. Lipid Profile No results for input(s): CHOL, HDL, LDLCALC, TRIG, CHOLHDL, LDLDIRECT in the last 72 hours. Thyroid function studies No results for input(s): TSH, T4TOTAL, T3FREE, THYROIDAB in the last 72 hours.  Invalid input(s): FREET3 Anemia work up No results for input(s): VITAMINB12, FOLATE, FERRITIN, TIBC, IRON, RETICCTPCT in the last 72 hours. Urinalysis    Component Value Date/Time   COLORURINE YELLOW 05/04/2016 1745   APPEARANCEUR CLOUDY (A) 05/04/2016 1745   LABSPEC 1.024 05/04/2016 1745   PHURINE 5.0 05/04/2016 1745   GLUCOSEU >=500 (A) 05/04/2016 1745   GLUCOSEU >=1000 11/07/2012 1205   HGBUR SMALL (A) 05/04/2016 1745   BILIRUBINUR NEGATIVE 05/04/2016 1745   KETONESUR 80 (A) 05/04/2016 1745   PROTEINUR NEGATIVE 05/04/2016 1745   UROBILINOGEN 0.2 11/24/2014 1045   NITRITE NEGATIVE 05/04/2016 1745   LEUKOCYTESUR SMALL (A) 05/04/2016 1745   Sepsis Labs Invalid input(s): PROCALCITONIN,  WBC,  LACTICIDVEN Microbiology Recent Results (from the past 240 hour(s))  Urine culture     Status: Abnormal   Collection Time: 05/04/16  5:45 PM  Result Value Ref Range Status   Specimen Description URINE, RANDOM  Final   Special Requests NONE  Final   Culture MULTIPLE SPECIES PRESENT, SUGGEST RECOLLECTION (A)  Final   Report Status 05/06/2016 FINAL  Final  MRSA PCR Screening     Status: None   Collection Time: 05/04/16 11:00 PM  Result Value Ref Range Status   MRSA by PCR NEGATIVE NEGATIVE Final    Comment:        The GeneXpert MRSA Assay (FDA approved for NASAL  specimens only), is one component  of a comprehensive MRSA colonization surveillance program. It is not intended to diagnose MRSA infection nor to guide or monitor treatment for MRSA infections.      Time coordinating discharge: Over 30 minutes  SIGNED:   Cordelia Poche, MD [facility] 05/10/2016, 12:52 PM Pager [phone]  If 7PM-7AM, please contact night-coverage [email or web address] Password [email or web address]

## 2016-05-10 NOTE — Progress Notes (Signed)
Pt 30 minutes post void was 646 mls in the bladder.  MD texted with results. Cont with plan of care

## 2016-05-13 ENCOUNTER — Encounter (HOSPITAL_COMMUNITY): Payer: Self-pay

## 2016-05-13 ENCOUNTER — Inpatient Hospital Stay (HOSPITAL_COMMUNITY)
Admission: EM | Admit: 2016-05-13 | Discharge: 2016-05-16 | DRG: 638 | Disposition: A | Discharge status: Home / Self Care | Payer: Medicaid Other | Attending: Internal Medicine | Admitting: Internal Medicine

## 2016-05-13 DIAGNOSIS — J4520 Mild intermittent asthma, uncomplicated: Secondary | ICD-10-CM | POA: Diagnosis not present

## 2016-05-13 DIAGNOSIS — F419 Anxiety disorder, unspecified: Secondary | ICD-10-CM | POA: Diagnosis present

## 2016-05-13 DIAGNOSIS — G43A1 Cyclical vomiting, intractable: Secondary | ICD-10-CM

## 2016-05-13 DIAGNOSIS — E1010 Type 1 diabetes mellitus with ketoacidosis without coma: Secondary | ICD-10-CM | POA: Diagnosis not present

## 2016-05-13 DIAGNOSIS — Z885 Allergy status to narcotic agent status: Secondary | ICD-10-CM

## 2016-05-13 DIAGNOSIS — E1143 Type 2 diabetes mellitus with diabetic autonomic (poly)neuropathy: Secondary | ICD-10-CM | POA: Diagnosis present

## 2016-05-13 DIAGNOSIS — R000 Tachycardia, unspecified: Secondary | ICD-10-CM | POA: Diagnosis present

## 2016-05-13 DIAGNOSIS — K3184 Gastroparesis: Secondary | ICD-10-CM | POA: Diagnosis present

## 2016-05-13 DIAGNOSIS — F329 Major depressive disorder, single episode, unspecified: Secondary | ICD-10-CM | POA: Diagnosis not present

## 2016-05-13 DIAGNOSIS — R011 Cardiac murmur, unspecified: Secondary | ICD-10-CM | POA: Diagnosis present

## 2016-05-13 DIAGNOSIS — K219 Gastro-esophageal reflux disease without esophagitis: Secondary | ICD-10-CM | POA: Diagnosis not present

## 2016-05-13 DIAGNOSIS — I5032 Chronic diastolic (congestive) heart failure: Secondary | ICD-10-CM | POA: Diagnosis present

## 2016-05-13 DIAGNOSIS — M1990 Unspecified osteoarthritis, unspecified site: Secondary | ICD-10-CM | POA: Diagnosis present

## 2016-05-13 DIAGNOSIS — E1043 Type 1 diabetes mellitus with diabetic autonomic (poly)neuropathy: Secondary | ICD-10-CM | POA: Diagnosis present

## 2016-05-13 DIAGNOSIS — G8929 Other chronic pain: Secondary | ICD-10-CM | POA: Diagnosis present

## 2016-05-13 DIAGNOSIS — Z7951 Long term (current) use of inhaled steroids: Secondary | ICD-10-CM

## 2016-05-13 DIAGNOSIS — R338 Other retention of urine: Secondary | ICD-10-CM | POA: Diagnosis present

## 2016-05-13 DIAGNOSIS — Z79899 Other long term (current) drug therapy: Secondary | ICD-10-CM

## 2016-05-13 DIAGNOSIS — F32A Depression, unspecified: Secondary | ICD-10-CM | POA: Diagnosis present

## 2016-05-13 DIAGNOSIS — N319 Neuromuscular dysfunction of bladder, unspecified: Secondary | ICD-10-CM | POA: Diagnosis present

## 2016-05-13 DIAGNOSIS — Z794 Long term (current) use of insulin: Secondary | ICD-10-CM

## 2016-05-13 DIAGNOSIS — D72829 Elevated white blood cell count, unspecified: Secondary | ICD-10-CM | POA: Diagnosis present

## 2016-05-13 DIAGNOSIS — R110 Nausea: Secondary | ICD-10-CM

## 2016-05-13 DIAGNOSIS — E860 Dehydration: Secondary | ICD-10-CM | POA: Diagnosis not present

## 2016-05-13 DIAGNOSIS — Z8249 Family history of ischemic heart disease and other diseases of the circulatory system: Secondary | ICD-10-CM

## 2016-05-13 DIAGNOSIS — R339 Retention of urine, unspecified: Secondary | ICD-10-CM | POA: Diagnosis present

## 2016-05-13 DIAGNOSIS — R109 Unspecified abdominal pain: Secondary | ICD-10-CM | POA: Diagnosis present

## 2016-05-13 DIAGNOSIS — Z8719 Personal history of other diseases of the digestive system: Secondary | ICD-10-CM

## 2016-05-13 DIAGNOSIS — R319 Hematuria, unspecified: Secondary | ICD-10-CM | POA: Diagnosis present

## 2016-05-13 DIAGNOSIS — K760 Fatty (change of) liver, not elsewhere classified: Secondary | ICD-10-CM | POA: Diagnosis present

## 2016-05-13 DIAGNOSIS — Z91010 Allergy to peanuts: Secondary | ICD-10-CM

## 2016-05-13 DIAGNOSIS — Z91018 Allergy to other foods: Secondary | ICD-10-CM

## 2016-05-13 DIAGNOSIS — D134 Benign neoplasm of liver: Secondary | ICD-10-CM | POA: Diagnosis present

## 2016-05-13 DIAGNOSIS — Z833 Family history of diabetes mellitus: Secondary | ICD-10-CM

## 2016-05-13 DIAGNOSIS — J45909 Unspecified asthma, uncomplicated: Secondary | ICD-10-CM | POA: Diagnosis present

## 2016-05-13 DIAGNOSIS — I110 Hypertensive heart disease with heart failure: Secondary | ICD-10-CM | POA: Diagnosis present

## 2016-05-13 DIAGNOSIS — R112 Nausea with vomiting, unspecified: Secondary | ICD-10-CM | POA: Diagnosis present

## 2016-05-13 DIAGNOSIS — Z9049 Acquired absence of other specified parts of digestive tract: Secondary | ICD-10-CM

## 2016-05-13 LAB — CBC WITH DIFFERENTIAL/PLATELET
BASOS ABS: 0 10*3/uL (ref 0.0–0.1)
Basophils Relative: 0 %
Eosinophils Absolute: 0.2 10*3/uL (ref 0.0–0.7)
Eosinophils Relative: 1 %
HEMATOCRIT: 46.2 % — AB (ref 36.0–46.0)
HEMOGLOBIN: 15.9 g/dL — AB (ref 12.0–15.0)
LYMPHS PCT: 19 %
Lymphs Abs: 2.8 10*3/uL (ref 0.7–4.0)
MCH: 30.5 pg (ref 26.0–34.0)
MCHC: 34.4 g/dL (ref 30.0–36.0)
MCV: 88.5 fL (ref 78.0–100.0)
Monocytes Absolute: 1.6 10*3/uL — ABNORMAL HIGH (ref 0.1–1.0)
Monocytes Relative: 11 %
NEUTROS ABS: 10.3 10*3/uL — AB (ref 1.7–7.7)
NEUTROS PCT: 69 %
Platelets: 547 10*3/uL — ABNORMAL HIGH (ref 150–400)
RBC: 5.22 MIL/uL — ABNORMAL HIGH (ref 3.87–5.11)
RDW: 13.5 % (ref 11.5–15.5)
WBC: 14.8 10*3/uL — ABNORMAL HIGH (ref 4.0–10.5)

## 2016-05-13 LAB — COMPREHENSIVE METABOLIC PANEL
ALBUMIN: 4.8 g/dL (ref 3.5–5.0)
ALT: 13 U/L — ABNORMAL LOW (ref 14–54)
ALT: 11 U/L — ABNORMAL LOW (ref 14–54)
ANION GAP: 22 — AB (ref 5–15)
ANION GAP: 18 — AB (ref 5–15)
AST: 18 U/L (ref 15–41)
AST: 15 U/L (ref 15–41)
Albumin: 3.8 g/dL (ref 3.5–5.0)
Alkaline Phosphatase: 136 U/L — ABNORMAL HIGH (ref 38–126)
Alkaline Phosphatase: 105 U/L (ref 38–126)
BILIRUBIN TOTAL: 1.2 mg/dL (ref 0.3–1.2)
BUN: 24 mg/dL — ABNORMAL HIGH (ref 6–20)
BUN: 20 mg/dL (ref 6–20)
CHLORIDE: 93 mmol/L — AB (ref 101–111)
CO2: 22 mmol/L (ref 22–32)
CO2: 17 mmol/L — ABNORMAL LOW (ref 22–32)
Calcium: 10.3 mg/dL (ref 8.9–10.3)
Calcium: 8.5 mg/dL — ABNORMAL LOW (ref 8.9–10.3)
Chloride: 100 mmol/L — ABNORMAL LOW (ref 101–111)
Creatinine, Ser: 1.17 mg/dL — ABNORMAL HIGH (ref 0.44–1.00)
Creatinine, Ser: 0.96 mg/dL (ref 0.44–1.00)
GFR calc Af Amer: >60 mL/min (ref >60)
GFR calc Af Amer: >60 mL/min (ref >60)
GFR calc non Af Amer: >60 mL/min (ref >60)
GLUCOSE: 118 mg/dL — AB (ref 65–99)
Glucose, Bld: 206 mg/dL — ABNORMAL HIGH (ref 65–99)
POTASSIUM: 3.2 mmol/L — AB (ref 3.5–5.1)
POTASSIUM: 3.5 mmol/L (ref 3.5–5.1)
Sodium: 137 mmol/L (ref 135–145)
Sodium: 135 mmol/L (ref 135–145)
TOTAL PROTEIN: 7.7 g/dL (ref 6.5–8.1)
Total Bilirubin: 1 mg/dL (ref 0.3–1.2)
Total Protein: 9.5 g/dL — ABNORMAL HIGH (ref 6.5–8.1)

## 2016-05-13 LAB — BLOOD GAS, VENOUS
ACID-BASE DEFICIT: 2.9 mmol/L — AB (ref 0.0–2.0)
BICARBONATE: 22.4 mmol/L (ref 20.0–28.0)
O2 SAT: 39.2 %
PATIENT TEMPERATURE: 98.6
pCO2, Ven: 42.8 mmHg — ABNORMAL LOW (ref 44.0–60.0)
pH, Ven: 7.339 (ref 7.250–7.430)

## 2016-05-13 LAB — CBG MONITORING, ED: GLUCOSE-CAPILLARY: 355 mg/dL — AB (ref 65–99)

## 2016-05-13 LAB — URINALYSIS, ROUTINE W REFLEX MICROSCOPIC
BACTERIA UA: NONE SEEN
BILIRUBIN URINE: NEGATIVE
Glucose, UA: >=500 mg/dL — AB
KETONES UR: 80 mg/dL — AB
NITRITE: NEGATIVE
PROTEIN: 100 mg/dL — AB
Specific Gravity, Urine: 1.014 (ref 1.005–1.030)
Squamous Epithelial / LPF: NONE SEEN
pH: 5 (ref 5.0–8.0)

## 2016-05-13 LAB — PREGNANCY, URINE: PREG TEST UR: NEGATIVE

## 2016-05-13 LAB — LIPASE, BLOOD: LIPASE: 11 U/L (ref 11–51)

## 2016-05-13 MED ORDER — FENTANYL CITRATE (PF) 100 MCG/2ML IJ SOLN
50.0000 ug | Freq: Once | INTRAMUSCULAR | Status: AC
  Administered 2016-05-13: 50 ug via INTRAVENOUS
  Filled 2016-05-13: qty 2

## 2016-05-13 MED ORDER — PROMETHAZINE HCL 25 MG PO TABS: 25.0000 mg | ORAL_TABLET | Freq: Four times a day (QID) | ORAL | 0 refills | Status: DC | PRN

## 2016-05-13 MED ORDER — SODIUM CHLORIDE 0.9 % IV BOLUS (SEPSIS)
1000.0000 mL | Freq: Once | INTRAVENOUS | Status: AC
  Administered 2016-05-13: 1000 mL via INTRAVENOUS

## 2016-05-13 MED ORDER — SODIUM CHLORIDE 0.9 % IV SOLN
INTRAVENOUS | Status: DC
  Administered 2016-05-14: 3.3 [IU]/h via INTRAVENOUS
  Filled 2016-05-13: qty 2.5

## 2016-05-13 MED ORDER — METOCLOPRAMIDE HCL 5 MG/ML IJ SOLN
10.0000 mg | Freq: Once | INTRAMUSCULAR | Status: AC
  Administered 2016-05-13: 10 mg via INTRAVENOUS
  Filled 2016-05-13: qty 2

## 2016-05-13 MED ORDER — METOCLOPRAMIDE HCL 5 MG/ML IJ SOLN
10.0000 mg | Freq: Once | INTRAMUSCULAR | Status: AC
  Administered 2016-05-14: 10 mg via INTRAVENOUS
  Filled 2016-05-13: qty 2

## 2016-05-13 MED ORDER — SODIUM CHLORIDE 0.9 % IV SOLN: INTRAVENOUS | Status: DC

## 2016-05-13 MED ORDER — SODIUM CHLORIDE 0.9 % IV BOLUS (SEPSIS)
1000.0000 mL | Freq: Once | INTRAVENOUS | Status: AC
  Administered 2016-05-14: 1000 mL via INTRAVENOUS

## 2016-05-13 MED ORDER — HALOPERIDOL LACTATE 5 MG/ML IJ SOLN
2.0000 mg | Freq: Once | INTRAMUSCULAR | Status: AC
  Administered 2016-05-13: 2 mg via INTRAVENOUS
  Filled 2016-05-13: qty 1

## 2016-05-13 NOTE — ED Notes (Signed)
Before patient is discharged, Dr. Sherry Ruffing would like to re-evaluate patients blood sugar and speak with her before she is given her discharge papers that are printed.

## 2016-05-13 NOTE — ED Triage Notes (Signed)
She c/o persistent abd. Discomfort and vomiting. She has known dx of gastroparesis.

## 2016-05-13 NOTE — Care Management (Signed)
ED CM met with patient at bedside to discuss care transition planning.  Patient was recently admitted with DKA. She presents to [facility] ED with similar complaints of abdominal pains.  Patient has had 10 ED visits with 7 resulting in hospital stays. Patient confirms Vicenta Aly NP is PCP, she states, she is having some difficulty with follow up appointments, CM offered to contact office she is agreeable and provided verbal consent

## 2016-05-13 NOTE — ED Notes (Signed)
Patient is wanting an update of whether she is being admitted or discharged. Dr. Sherry Ruffing reports he will come speak with her concerning her disposition.

## 2016-05-13 NOTE — ED Notes (Signed)
Pt has spoke with Dr. Sherry Ruffing and decided to stay for hospitalized treatment. Since her IV's have been discontinued to previous discharge status, IV team consult was placed.

## 2016-05-13 NOTE — ED Provider Notes (Signed)
Care assumed from Dr. Alvino Chapel. At time of transfer of care, patient is awaiting fluids and reassessment to determine if patient needs admission. Patient has gastroparesis and has had GI symptoms. Due to her lab abnormalities, admission was considered however, admitting team felt rehydration and reassessment was necessary.   Patient received a liter of fluids and repeat blood testing. Patient's anion gap improved to 18 from 22. Creatinine is now normal. CO2 slightly decreased at 17 from 22 however, patient's symptoms are resolved and she feels much better. Patient is requesting discharge.  Shared decision-making conversation held with patient to determine if she requires admission. Patient adamantly wants discharge given her improvement in symptoms.    10:46 PM After preparing patient's discharge paperwork, patient had his charge vital signs taken. Patient was found to still be in the 130s for heart rate unfortunately. Patient began having more abdominal pain and nausea. Given this worsening, and the patient's persistent anion gap with elevated glucose, patient will be admitted for further workup and management of her dehydration and tachycardia. Hospitalist team will be recalled for admission.   Patient started on treatment for diabetic ketoacidosis. It appears patient is now in DKA.  Ultrasound IV was placed by me for medication administration.  EMERGENCY DEPARTMENT  US GUIDANCE EXAM Emergency Ultrasound:  US Guidance for Needle Guidance  INDICATIONS: Difficult vascular access Linear probe used in real-time to visualize location of needle entry through skin.   PERFORMED BY: Myself IMAGES ARCHIVED?: Yes LIMITATIONS: None VIEWS USED: Transverse INTERPRETATION: Needle visualized within vein, Left arm and Needle gauge 20   Patient admitted for further management.   Clinical Impression: 1. Gastroparesis   2. Dehydration   3. Nausea     Disposition: Admit to Hospitalist  service       Courtney Paris, MD 05/14/16 [phone]

## 2016-05-13 NOTE — ED Notes (Signed)
IV team at bedside 

## 2016-05-13 NOTE — ED Provider Notes (Addendum)
[facility] DEPT Provider Note   CSN: [id number] Arrival date & time: 05/13/16  1159     History   Chief Complaint Chief Complaint  Patient presents with  . Abdominal Pain    HPI [name] is a 26 y.o. female.   Abdominal Pain   Associated symptoms include nausea and vomiting. Pertinent negatives include diarrhea and dysuria.  Patient presents with nausea vomiting and abdominal pain. Recent admission for DKA. Also history of pancreatitis and gastroparesis. She is a diabetic. States last couple days she has felt worse. Has red vomiting. States her sugars have been in the 200s. Pain is dull and constant and worsened vomiting. No diarrhea.  Past Medical History:  Diagnosis Date  . Anxiety   . Arthritis   . Asthma   . Diabetes mellitus 2007   IDDM.  poorly controlled, multiple admits with DKA  . Gallstones   . Gastroparesis   . Heart murmur   . Hepatic steatosis 11/26/2014   and hepatomegaly  . Hypertension    NOT CURRENTLY ON ANY BP MED  . Liver mass 11/26/2014  . Pancreatitis, acute 11/26/2014    Patient Active Problem List   Diagnosis Date Noted  . DKA, type 1, not at goal [facility]) 02/29/2016  . UTI (urinary tract infection) 01/22/2016  . Depression   . Diarrhea 11/09/2015  . Urinary retention   . Gastroparesis due to DM ([place]) 07/10/2015  . GERD (gastroesophageal reflux disease) 07/10/2015  . Depression with anxiety 07/10/2015  . Uncontrolled type 1 diabetes mellitus with hyperglycemia ([place])   . Gastroparesis 06/22/2015  . Altered mental status 06/22/2015  . Volume depletion 06/10/2015  . Protein-calorie malnutrition, severe 06/10/2015  . Hyperglycemia   . Intractable vomiting with nausea   . Elevated bilirubin   . Type 1 diabetes mellitus with hyperglycemia ([place]) 05/29/2015  . Hematemesis with nausea   . Intractable vomiting 05/28/2015  . Intractable nausea and vomiting 05/28/2015  . Abdominal pain in female   . Abdominal pain 05/24/2015  .  Hypertension 05/24/2015  . Dehydration   . Diabetic gastroparesis ([place])   . Chronic diastolic heart failure ([place]) 04/11/2015  . Hematemesis 04/08/2015  . DKA (diabetic ketoacidoses) ([place]) 03/22/2015  . Nausea vomiting and diarrhea 03/22/2015  . Nausea & vomiting   . Diabetic ketoacidosis without coma associated with type 1 diabetes mellitus ([place])   . S/P laparoscopic cholecystectomy 02/11/2015  . Status post laparoscopic cholecystectomy 02/11/2015  . Postextubation stridor   . Nausea and vomiting 11/27/2014  . Pancreatitis, acute 11/26/2014  . Volume overload 11/26/2014  . Hypokalemia 11/26/2014  . Hepatic steatosis 11/26/2014  . Liver mass 11/26/2014  . Sepsis ([place]) 11/25/2014  . Sinus tachycardia 11/25/2014  . Hypomagnesemia 11/25/2014  . Hypophosphatemia 11/25/2014  . Elevated amylase and lipase 11/25/2014  . DKA, type 1 ([place]) 11/24/2014  . Elevated LFTs 11/24/2014  . Acute kidney injury ([place]) 11/24/2014  . Migraine headache 11/24/2014  . Asthma 06/29/2012  . Uncontrolled type 1 diabetes mellitus ([place]) 06/19/2010  . Goiter, unspecified 06/19/2010    Past Surgical History:  Procedure Laterality Date  . CHOLECYSTECTOMY N/A 02/11/2015   Procedure: LAPAROSCOPIC CHOLECYSTECTOMY WITH INTRAOPERATIVE CHOLANGIOGRAM;  Surgeon: Greer Pickerel, MD;  Location: [facility] ORS;  Service: General;  Laterality: N/A;  . ESOPHAGOGASTRODUODENOSCOPY (EGD) WITH PROPOFOL Left 09/20/2014   Procedure: ESOPHAGOGASTRODUODENOSCOPY (EGD) WITH PROPOFOL;  Surgeon: Arta Silence, MD;  Location: [facility] ENDOSCOPY;  Service: Endoscopy;  Laterality: Left;  . WISDOM TOOTH EXTRACTION      OB History  Gravida Para Term Preterm AB Living   2 1 0 1 1 1    SAB TAB Ectopic Multiple Live Births   0 1 0 0 1       Home Medications    Prior to Admission medications   Medication Sig Start Date End Date Taking? Authorizing Provider  albuterol (PROVENTIL HFA;VENTOLIN HFA) 108 (90 BASE) MCG/ACT inhaler Inhale 2 puffs into the  lungs every 6 (six) hours as needed for wheezing or shortness of breath.    Yes Historical Provider, MD  albuterol (PROVENTIL) (2.5 MG/3ML) 0.083% nebulizer solution Take 2.5 mg by nebulization every 6 (six) hours as needed for wheezing or shortness of breath.    Yes Historical Provider, MD  budesonide-formoterol (SYMBICORT) 160-4.5 MCG/ACT inhaler Inhale 2 puffs into the lungs 2 (two) times daily.    Yes Historical Provider, MD  escitalopram (LEXAPRO) 10 MG tablet Take 0.5 tablets (5 mg total) by mouth daily. 01/19/16  Yes Barton Dubois, MD  famotidine (PEPCID) 20 MG tablet Take 1 tablet (20 mg total) by mouth at bedtime. 02/29/16  Yes Antonietta Breach, PA-C  insulin aspart (NOVOLOG FLEXPEN) 100 UNIT/ML FlexPen Inject into the skin See admin instructions. Pt uses 1 unit for every 10 grams of carbs with meals and snacks for BS greater than 50.   Yes Historical Provider, MD  Insulin Glargine (BASAGLAR KWIKPEN) 100 UNIT/ML SOPN Inject 0.25 mLs (25 Units total) into the skin at bedtime. 02/25/16  Yes Belkys A Regalado, MD  metoCLOPramide (REGLAN) 10 MG tablet Take 1 tablet (10 mg total) by mouth 3 (three) times daily before meals. 02/29/16  Yes Antonietta Breach, PA-C  metoprolol tartrate (LOPRESSOR) 25 MG tablet Take 0.5 tablets (12.5 mg total) by mouth 2 (two) times daily. 05/10/16  Yes Mariel Aloe, MD  pantoprazole (PROTONIX) 40 MG tablet Take 1 tablet (40 mg total) by mouth 2 (two) times daily. 02/25/16  Yes Belkys A Regalado, MD  polyethylene glycol (MIRALAX / GLYCOLAX) packet Take 17 g by mouth daily. Patient not taking: Reported on 05/13/2016 05/10/16   Mariel Aloe, MD    Family History Family History  Problem Relation Age of Onset  . Heart disease Maternal Grandmother   . Heart disease Maternal Grandfather   . Diabetes Mother   . Hyperlipidemia Mother   . Hypertension Father   . Heart disease Father   . Hypertension Paternal Grandmother   . Cancer Paternal Grandfather     Social History Social  History  Substance Use Topics  . Smoking status: Never Smoker  . Smokeless tobacco: Never Used  . Alcohol use No     Allergies   Peanut-containing drug products; Food; and Ultram [tramadol]   Review of Systems Review of Systems  Constitutional: Positive for appetite change.  HENT: Negative for congestion.   Respiratory: Negative for shortness of breath.   Gastrointestinal: Positive for abdominal pain, nausea and vomiting. Negative for diarrhea.  Genitourinary: Negative for dyspareunia and dysuria.  Musculoskeletal: Negative for back pain.  Skin: Negative for wound.  Psychiatric/Behavioral: The patient is not nervous/anxious.      Physical Exam Updated Vital Signs BP 110/84 (BP Location: Left Arm)   Pulse (!) 132   Temp 98.9 F (37.2 C) (Oral)   Resp 18   LMP 04/19/2016 (Approximate) Comment: negative preg test 05/04/16.  SpO2 99%   Physical Exam  Constitutional: She appears well-developed.  Patient is lying on her side in the bed drooling onto the bed.  HENT:  Head: Normocephalic.  Mucous membranes are dry  Eyes: EOM are normal.  Neck: Neck supple.  Cardiovascular: Normal rate.   Pulmonary/Chest: No respiratory distress.  Abdominal: There is tenderness.  Mild upper abdominal tenderness without rebound or guarding.  Musculoskeletal: She exhibits no edema.  Neurological: She is alert.  Skin: Skin is warm. Capillary refill takes less than 2 seconds.     ED Treatments / Results  Labs (all labs ordered are listed, but only abnormal results are displayed) Labs Reviewed  COMPREHENSIVE METABOLIC PANEL - Abnormal; Notable for the following:       Result Value   Potassium 3.2 (*)    Chloride 93 (*)    Glucose, Bld 118 (*)    BUN 24 (*)    Creatinine, Ser 1.17 (*)    Total Protein 9.5 (*)    ALT 13 (*)    Alkaline Phosphatase 136 (*)    Anion gap 22 (*)    All other components within normal limits  CBC WITH DIFFERENTIAL/PLATELET - Abnormal; Notable for the  following:    WBC 14.8 (*)    RBC 5.22 (*)    Hemoglobin 15.9 (*)    HCT 46.2 (*)    Platelets 547 (*)    Neutro Abs 10.3 (*)    Monocytes Absolute 1.6 (*)    All other components within normal limits  BLOOD GAS, VENOUS - Abnormal; Notable for the following:    pCO2, Ven 42.8 (*)    Acid-base deficit 2.9 (*)    All other components within normal limits  LIPASE, BLOOD  URINALYSIS, ROUTINE W REFLEX MICROSCOPIC  PREGNANCY, URINE    EKG  EKG Interpretation None       Radiology No results found.  Procedures Procedures (including critical care time)  Medications Ordered in ED Medications  sodium chloride 0.9 % bolus 1,000 mL (1,000 mLs Intravenous New Bag/Given 05/13/16 1531)  sodium chloride 0.9 % bolus 1,000 mL (1,000 mLs Intravenous New Bag/Given 05/13/16 1339)  metoCLOPramide (REGLAN) injection 10 mg (10 mg Intravenous Given 05/13/16 1339)  fentaNYL (SUBLIMAZE) injection 50 mcg (50 mcg Intravenous Given 05/13/16 1339)     Initial Impression / Assessment and Plan / ED Course  I have reviewed the triage vital signs and the nursing notes.  Pertinent labs & imaging results that were available during my care of the patient were reviewed by me and considered in my medical decision making (see chart for details).     Patient with nausea vomiting abdominal pain. History gastroparesis. Tachycardic at about 130. Creatinine mildly increased and has anion gap. Not acidotic over. With continued Recent admissions will admit to internal medicine.  Final Clinical Impressions(s) / ED Diagnoses   Final diagnoses:  Gastroparesis  Dehydration    New Prescriptions New Prescriptions   No medications on file     Davonna Belling, MD 05/13/16 1528  Discussed with Dr. Dyann Kief. States she has chronic tachycardia that they thought was related to dehydration so she was started on metoprolol when it did not resolve with fluids. He has had decreased her heart rate as an inpatient. Will  give a second liter of fluid and reevaluate before admission. Will give oral trial here also.   Davonna Belling, MD 05/13/16 [phone]

## 2016-05-13 NOTE — ED Notes (Signed)
Pt is aware she need a urine sample. She stated "she didn't have to go right now."

## 2016-05-13 NOTE — H&P (Addendum)
[name]:[id number] DOB: Aug 31, 1990 DOA: 05/13/2016     PCP: Vicenta Aly, FNP   Outpatient Specialists: none Patient coming from:   Lives  at home     Chief Complaint: Nausea vomiting  HPI: [name] is a 26 y.o. female with medical history significant of diabetes mellitus type 1, recurrent DKA recent pancreatitis, liver adenomas, anxiety, hepatic steatosis, asthma    Presented with nausea vomiting and abdominal pain she was just discharged from hospital 3 days ago for DKA on arrival heart rate 140 Reports abdominal pain has been getting worse over past few days has but sugar has been in 200s pain is dull and constant no associated diarrhea and vomiting symptoms makes pain worse  Initially hospitalist was called for patient but thought that this is not consistent with DKA recommended IV fluids despite aggressive fluid resuscitation patient's bicarbonate has went down from 22-17 she feels better regarding nausea and vomiting that continue to be tachycardic at which point patient was admitted for further management. deneis any fever no chest pain no shortness of breath. Denies medication noncompliance reports she just started to feel worse soon after she was discharged to home had decreased PO intake and increased abdominal discomfort. Regarding pertinent Chronic problems: She has been admitted for DKA May 04 2016 [facility] hospital stay was complicated by recurrence of DKA requiring restart of her insulin drip and IV bicarbonate as well as 1 L of bicarbonate drip. This has recurred a candidate but at that time was able to be controlled with increasing her dose of Lantus  prior to that she had had numerous admissions for DKA over past 6 months Usually complicated by slow recovery of metabolic acidosis in her pattern initial metabolic acidosis has a gap and 9 she has persistent non-gap acidosis he have had in the past profound volume depletion requiring significant fluid  resuscitation eventually resulting in improvement of sinus tachycardia and metabolic acidosis Patient has history of opioid dependence with chronic pain in her abdomen she also has history of marijuana abuse which likely contributing to recurrent nausea and vomiting she may have gastroparesis exacerbated by a good dependence and abuse in the past when opioids have been decreased her nausea and vomiting have improved although she has a propensity at that point to leave [place] She has chronic tachycardia - per prior note it does resolve with the aggressive fluid resuscitation although was not resolved on the prior discharge and patient was started on beta blocker plan to follow-up  He has chronic urinary retention requiring catheterization this is chronically distended likely neurogenic bladder secondary to uncontrolled diabetes, she's been seen by urology plan to have her follow-up in the clinic   IN ER:  Temp (24hrs), Avg:98.4 F (36.9 C), Min:97.5 F (36.4 C), Max:98.9 F (37.2 C)      RR 20 97% HR 126 BP 128/82 Na 135 K 3.5 bicarb initially 22 now down to 17  Cr 0.96 lipase11 VBG7.339 /42.8  WBC 14.8 Hg 15.9 Following Medications were ordered in ER: Medications  sodium chloride 0.9 % bolus 1,000 mL (not administered)  metoCLOPramide (REGLAN) injection 10 mg (not administered)  insulin regular (NOVOLIN R,HUMULIN R) 250 Units in sodium chloride 0.9 % 250 mL (1 Units/mL) infusion (not administered)  0.9 %  sodium chloride infusion (not administered)  sodium chloride 0.9 % bolus 1,000 mL (0 mLs Intravenous Stopped 05/13/16 1738)  metoCLOPramide (REGLAN) injection 10 mg (10 mg Intravenous Given 05/13/16 1339)  fentaNYL (SUBLIMAZE) injection  50 mcg (50 mcg Intravenous Given 05/13/16 1339)  sodium chloride 0.9 % bolus 1,000 mL (0 mLs Intravenous Stopped 05/13/16 1738)  haloperidol lactate (HALDOL) injection 2 mg (2 mg Intravenous Given 05/13/16 1554)      Hospitalist was called  for admission for DKA  Review of Systems:    Pertinent positives include: abdominal pain, nausea, vomiting,  Constitutional:  No weight loss, night sweats, Fevers, chills, fatigue, weight loss  HEENT:  No headaches, Difficulty swallowing,Tooth/dental problems,Sore throat,  No sneezing, itching, ear ache, nasal congestion, post nasal drip,  Cardio-vascular:  No chest pain, Orthopnea, PND, anasarca, dizziness, palpitations.no Bilateral lower extremity swelling  GI:  No heartburn, indigestion,  diarrhea, change in bowel habits, loss of appetite, melena, blood in stool, hematemesis Resp:  no shortness of breath at rest. No dyspnea on exertion, No excess mucus, no productive cough, No non-productive cough, No coughing up of blood.No change in color of mucus.No wheezing. Skin:  no rash or lesions. No jaundice GU:  no dysuria, change in color of urine, no urgency or frequency. No straining to urinate.  No flank pain.  Musculoskeletal:  No joint pain or no joint swelling. No decreased range of motion. No back pain.  Psych:  No change in mood or affect. No depression or anxiety. No memory loss.  Neuro: no localizing neurological complaints, no tingling, no weakness, no double vision, no gait abnormality, no slurred speech, no confusion  As per HPI otherwise 10 point review of systems negative.   Past Medical History: Past Medical History:  Diagnosis Date  . Anxiety   . Arthritis   . Asthma   . Diabetes mellitus 2007   IDDM.  poorly controlled, multiple admits with DKA  . Gallstones   . Gastroparesis   . Heart murmur   . Hepatic steatosis 11/26/2014   and hepatomegaly  . Hypertension    NOT CURRENTLY ON ANY BP MED  . Liver mass 11/26/2014  . Pancreatitis, acute 11/26/2014   Past Surgical History:  Procedure Laterality Date  . CHOLECYSTECTOMY N/A 02/11/2015   Procedure: LAPAROSCOPIC CHOLECYSTECTOMY WITH INTRAOPERATIVE CHOLANGIOGRAM;  Surgeon: Greer Pickerel, MD;  Location: [facility] ORS;   Service: General;  Laterality: N/A;  . ESOPHAGOGASTRODUODENOSCOPY (EGD) WITH PROPOFOL Left 09/20/2014   Procedure: ESOPHAGOGASTRODUODENOSCOPY (EGD) WITH PROPOFOL;  Surgeon: Arta Silence, MD;  Location[facility] ENDOSCOPY;  Service: Endoscopy;  Laterality: Left;  . WISDOM TOOTH EXTRACTION       Social History:  Ambulatory   independently     reports that she has never smoked. She has never used smokeless tobacco. She reports that she uses drugs, including Marijuana. She reports that she does not drink alcohol.  Allergies:   Allergies  Allergen Reactions  . Peanut-Containing Drug Products Swelling and Other (See Comments)    Reaction:  Swelling of mouth and lips   . 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   Reaction:  Swelling of mouth and lips   . Ultram [Tramadol] Itching       Family History:   Family History  Problem Relation Age of Onset  . Heart disease Maternal Grandmother   . Heart disease Maternal Grandfather   . Diabetes Mother   . Hyperlipidemia Mother   . Hypertension Father   . Heart disease Father   . Hypertension Paternal Grandmother   . Cancer Paternal Grandfather     Medications: Prior to Admission medications   Medication Sig Start Date End Date Taking? Authorizing  Provider  albuterol (PROVENTIL HFA;VENTOLIN HFA) 108 (90 BASE) MCG/ACT inhaler Inhale 2 puffs into the lungs every 6 (six) hours as needed for wheezing or shortness of breath.    Yes Historical Provider, MD  albuterol (PROVENTIL) (2.5 MG/3ML) 0.083% nebulizer solution Take 2.5 mg by nebulization every 6 (six) hours as needed for wheezing or shortness of breath.    Yes Historical Provider, MD  budesonide-formoterol (SYMBICORT) 160-4.5 MCG/ACT inhaler Inhale 2 puffs into the lungs 2 (two) times daily.    Yes Historical Provider, MD  escitalopram (LEXAPRO) 10 MG tablet Take 0.5 tablets (5 mg total) by mouth daily. 01/19/16  Yes Barton Dubois, MD  famotidine (PEPCID)  20 MG tablet Take 1 tablet (20 mg total) by mouth at bedtime. 02/29/16  Yes Antonietta Breach, PA-C  insulin aspart (NOVOLOG FLEXPEN) 100 UNIT/ML FlexPen Inject into the skin See admin instructions. Pt uses 1 unit for every 10 grams of carbs with meals and snacks for BS greater than 50.   Yes Historical Provider, MD  Insulin Glargine (BASAGLAR KWIKPEN) 100 UNIT/ML SOPN Inject 0.25 mLs (25 Units total) into the skin at bedtime. 02/25/16  Yes Belkys A Regalado, MD  metoCLOPramide (REGLAN) 10 MG tablet Take 1 tablet (10 mg total) by mouth 3 (three) times daily before meals. 02/29/16  Yes Antonietta Breach, PA-C  metoprolol tartrate (LOPRESSOR) 25 MG tablet Take 0.5 tablets (12.5 mg total) by mouth 2 (two) times daily. 05/10/16  Yes Mariel Aloe, MD  pantoprazole (PROTONIX) 40 MG tablet Take 1 tablet (40 mg total) by mouth 2 (two) times daily. 02/25/16  Yes Belkys A Regalado, MD  polyethylene glycol (MIRALAX / GLYCOLAX) packet Take 17 g by mouth daily. Patient not taking: Reported on 05/13/2016 05/10/16   Mariel Aloe, MD  promethazine (PHENERGAN) 25 MG tablet Take 1 tablet (25 mg total) by mouth every 6 (six) hours as needed for nausea or vomiting. 05/13/16   Gwenyth Allegra Tegeler, MD    Physical Exam: Patient Vitals for the past 24 hrs:  BP Temp Temp src Pulse Resp SpO2  05/13/16 2137 128/82 97.5 F (36.4 C) Oral (!) 126 20 97 %  05/13/16 1932 129/69 98.7 F (37.1 C) Oral (!) 123 (!) 22 99 %  05/13/16 1836 119/78 - - (!) 128 18 100 %  05/13/16 1532 110/84 98.9 F (37.2 C) Oral (!) 132 18 99 %  05/13/16 1508 123/79 - - (!) 130 18 100 %  05/13/16 1218 114/75 98.4 F (36.9 C) Oral 78 16 100 %    1. General:  in No Acute distress 2. Psychological: Alert and   Oriented 3. Head/ENT:     Dry Mucous Membranes                          Head Non traumatic, neck supple                          Normal   Dentition 4. SKIN:   decreased Skin turgor,  Skin clean Dry and intact no rash 5. Heart: Regular rate and rhythm no   Murmur, Rub or gallop 6. Lungs:  Clear to auscultation bilaterally, no wheezes or crackles   7. Abdomen: Soft,  non-tender, Non distended 8. Lower extremities: no clubbing, cyanosis, or edema 9. Neurologically Grossly intact, moving all 4 extremities equally   10. MSK: Normal range of motion   body mass index is unknown because there  is no height or weight on file.  Labs on Admission:   Labs on Admission: I have personally reviewed following labs and imaging studies  CBC:  Recent Labs Lab 05/13/16 1408  WBC 14.8*  NEUTROABS 10.3*  HGB 15.9*  HCT 46.2*  MCV 88.5  PLT 829*   Basic Metabolic Panel:  Recent Labs Lab 05/09/16 1235 05/09/16 1637 05/09/16 1930 05/13/16 1408 05/13/16 1752  NA 134* 135 136 137 135  K 3.6 3.4* 3.9 3.2* 3.5  CL 106 106 107 93* 100*  CO2 21* 22 23 22  17*  GLUCOSE 315* 228* 189* 118* 206*  BUN 6 6 8  24* 20  CREATININE 0.54 0.50 0.52 1.17* 0.96  CALCIUM 8.4* 8.5* 8.7* 10.3 8.5*   GFR: Estimated Creatinine Clearance: 76.7 mL/min (by C-G formula based on SCr of 0.96 mg/dL). Liver Function Tests:  Recent Labs Lab 05/13/16 1408 05/13/16 1752  AST 18 15  ALT 13* 11*  ALKPHOS 136* 105  BILITOT 1.0 1.2  PROT 9.5* 7.7  ALBUMIN 4.8 3.8    Recent Labs Lab 05/13/16 1408  LIPASE 11   No results for input(s): AMMONIA in the last 168 hours. Coagulation Profile: No results for input(s): INR, PROTIME in the last 168 hours. Cardiac Enzymes: No results for input(s): CKTOTAL, CKMB, CKMBINDEX, TROPONINI in the last 168 hours. BNP (last 3 results) No results for input(s): PROBNP in the last 8760 hours. HbA1C: No results for input(s): HGBA1C in the last 72 hours. CBG:  Recent Labs Lab 05/09/16 1633 05/09/16 2312 05/10/16 0743 05/10/16 1158 05/13/16 2205  GLUCAP 199* 249* 186* 209* 355*   Lipid Profile: No results for input(s): CHOL, HDL, LDLCALC, TRIG, CHOLHDL, LDLDIRECT in the last 72 hours. Thyroid Function Tests: No results for  input(s): TSH, T4TOTAL, FREET4, T3FREE, THYROIDAB in the last 72 hours. Anemia Panel: No results for input(s): VITAMINB12, FOLATE, FERRITIN, TIBC, IRON, RETICCTPCT in the last 72 hours. Urine analysis:    Component Value Date/Time   COLORURINE YELLOW 05/13/2016 1408   APPEARANCEUR TURBID (A) 05/13/2016 1408   LABSPEC 1.014 05/13/2016 1408   PHURINE 5.0 05/13/2016 1408   GLUCOSEU >=500 (A) 05/13/2016 1408   GLUCOSEU >=1000 11/07/2012 1205   HGBUR SMALL (A) 05/13/2016 1408   BILIRUBINUR NEGATIVE 05/13/2016 1408   KETONESUR 80 (A) 05/13/2016 1408   PROTEINUR 100 (A) 05/13/2016 1408   UROBILINOGEN 0.2 11/24/2014 1045   NITRITE NEGATIVE 05/13/2016 1408   LEUKOCYTESUR LARGE (A) 05/13/2016 1408   Sepsis Labs: @LABRCNTIP (procalcitonin:4,lacticidven:4) ) Recent Results (from the past 240 hour(s))  Urine culture     Status: Abnormal   Collection Time: 05/04/16  5:45 PM  Result Value Ref Range Status   Specimen Description URINE, RANDOM  Final   Special Requests NONE  Final   Culture MULTIPLE SPECIES PRESENT, SUGGEST RECOLLECTION (A)  Final   Report Status 05/06/2016 FINAL  Final  MRSA PCR Screening     Status: None   Collection Time: 05/04/16 11:00 PM  Result Value Ref Range Status   MRSA by PCR NEGATIVE NEGATIVE Final    Comment:        The GeneXpert MRSA Assay (FDA approved for NASAL specimens only), is one component of a comprehensive MRSA colonization surveillance program. It is not intended to diagnose MRSA infection nor to guide or monitor treatment for MRSA infections.       UA too numerous to count WBC and RBC no bacteria  Lab Results  Component Value Date   HGBA1C 8.9 (H) 03/01/2016  Estimated Creatinine Clearance: 76.7 mL/min (by C-G formula based on SCr of 0.96 mg/dL).  BNP (last 3 results) No results for input(s): PROBNP in the last 8760 hours.   ECG REPORT  Independently reviewed Rate:134  Rhythm: sinus tachycardia ST&T Change: No acute ischemic  changes  QTC 435  There were no vitals filed for this visit.   Cultures:    Component Value Date/Time   SDES URINE, RANDOM 05/04/2016 1745   SPECREQUEST NONE 05/04/2016 1745   CULT MULTIPLE SPECIES PRESENT, SUGGEST RECOLLECTION (A) 05/04/2016 1745   REPTSTATUS 05/06/2016 FINAL 05/04/2016 1745     Radiological Exams on Admission: No results found.  Chart has been reviewed    Assessment/Plan  26 y.o. female with medical history significant of diabetes mellitus type 1, recurrent DKA recent pancreatitis, liver adenomas, anxiety, hepatic steatosis, asthma admitted for recurrent DKASevere dehydration tachycardia and acute renal retention intractable nausea and vomiting secondary to gastroparesis secondary to diabetes and opioids     Present on Admission: . DKA, type 1 ([place]) - will admit per DKA protocol, obtain serial BMET, start on glucosestabalizer, aggressive IVF. Change IVF to D5 1/2Na after BG <250 . Will work up cause of DKA with CXR, ECG one set of cardiac enzymes, UA. Monitor in [place]. Replace potassium as needed.     Patient has propensity to have recurrent episodes of DKA during hospitalization with difficult to control metabolic acidosis . Chronic diastolic heart failure (HCC) - currently severely dehydrated we'll rehydrate but follow to avoid fluid overload . Dehydration aggressive fluid resuscitation . Diabetic gastroparesis ([place]) scheduled Reglan parietal avoid opioids   . Asthma stable continue a beautiful when necessary  . Hematuria - possibly secondary to bladder chronic distention and irritation await results of urine culture and elevated white blood cell count as well . Intractable vomiting with nausea likely secondary to gastroparesis secondary to diabetes and opioids will try to avoid opioids treat her Reglan need diabetes management improvement . Depression continue home medications . GERD (gastroesophageal reflux disease) stable continue home  medications  . Acute urinary retention- ordered foley will need to follow-up with urology   Other plan as per orders.  DVT prophylaxis:    Lovenox     Code Status:  FULL CODE as per patient    Family Communication:   Family not  at  Bedside    Disposition Plan:    To home once workup is complete and patient is stable    Diabetes coordinator                          Consults called: none   Admission status:   obs   Level of care       SDU      I have spent a total of 56 min on this admission   Dru Laurel 05/13/2016, 12:46 AM    [facility]  Pager [phone]   after 2 AM please page floor coverage PA If 7AM-7PM, please contact the day team taking care of the patient  [email or web address]  Password [email or web address]

## 2016-05-13 NOTE — ED Triage Notes (Signed)
EMS vital signs   BP: 122/82 HR: 140 R: 16

## 2016-05-13 NOTE — Discharge Instructions (Signed)
Please take your nausea medicine to maintain hydration. Please call your PCP to follow-up in the next several days. If any symptoms change or worsen, please return to the nearest emergency department.

## 2016-05-14 ENCOUNTER — Observation Stay (HOSPITAL_COMMUNITY): Payer: Medicaid Other

## 2016-05-14 DIAGNOSIS — R339 Retention of urine, unspecified: Secondary | ICD-10-CM | POA: Diagnosis present

## 2016-05-14 DIAGNOSIS — Z79899 Other long term (current) drug therapy: Secondary | ICD-10-CM | POA: Diagnosis not present

## 2016-05-14 DIAGNOSIS — N319 Neuromuscular dysfunction of bladder, unspecified: Secondary | ICD-10-CM | POA: Diagnosis present

## 2016-05-14 DIAGNOSIS — E1143 Type 2 diabetes mellitus with diabetic autonomic (poly)neuropathy: Secondary | ICD-10-CM | POA: Diagnosis not present

## 2016-05-14 DIAGNOSIS — F419 Anxiety disorder, unspecified: Secondary | ICD-10-CM | POA: Diagnosis present

## 2016-05-14 DIAGNOSIS — E1043 Type 1 diabetes mellitus with diabetic autonomic (poly)neuropathy: Secondary | ICD-10-CM | POA: Diagnosis present

## 2016-05-14 DIAGNOSIS — R109 Unspecified abdominal pain: Secondary | ICD-10-CM | POA: Diagnosis present

## 2016-05-14 DIAGNOSIS — I5032 Chronic diastolic (congestive) heart failure: Secondary | ICD-10-CM

## 2016-05-14 DIAGNOSIS — I110 Hypertensive heart disease with heart failure: Secondary | ICD-10-CM | POA: Diagnosis present

## 2016-05-14 DIAGNOSIS — R338 Other retention of urine: Secondary | ICD-10-CM | POA: Diagnosis not present

## 2016-05-14 DIAGNOSIS — M1990 Unspecified osteoarthritis, unspecified site: Secondary | ICD-10-CM | POA: Diagnosis present

## 2016-05-14 DIAGNOSIS — J45909 Unspecified asthma, uncomplicated: Secondary | ICD-10-CM | POA: Diagnosis present

## 2016-05-14 DIAGNOSIS — E1010 Type 1 diabetes mellitus with ketoacidosis without coma: Secondary | ICD-10-CM | POA: Diagnosis not present

## 2016-05-14 DIAGNOSIS — D134 Benign neoplasm of liver: Secondary | ICD-10-CM | POA: Diagnosis present

## 2016-05-14 DIAGNOSIS — F329 Major depressive disorder, single episode, unspecified: Secondary | ICD-10-CM | POA: Diagnosis present

## 2016-05-14 DIAGNOSIS — Z7951 Long term (current) use of inhaled steroids: Secondary | ICD-10-CM | POA: Diagnosis not present

## 2016-05-14 DIAGNOSIS — Z794 Long term (current) use of insulin: Secondary | ICD-10-CM | POA: Diagnosis not present

## 2016-05-14 DIAGNOSIS — K219 Gastro-esophageal reflux disease without esophagitis: Secondary | ICD-10-CM | POA: Diagnosis present

## 2016-05-14 DIAGNOSIS — E860 Dehydration: Secondary | ICD-10-CM | POA: Diagnosis not present

## 2016-05-14 DIAGNOSIS — R319 Hematuria, unspecified: Secondary | ICD-10-CM | POA: Diagnosis present

## 2016-05-14 DIAGNOSIS — K3184 Gastroparesis: Secondary | ICD-10-CM | POA: Diagnosis not present

## 2016-05-14 DIAGNOSIS — Z885 Allergy status to narcotic agent status: Secondary | ICD-10-CM | POA: Diagnosis not present

## 2016-05-14 DIAGNOSIS — D72829 Elevated white blood cell count, unspecified: Secondary | ICD-10-CM | POA: Diagnosis present

## 2016-05-14 DIAGNOSIS — R000 Tachycardia, unspecified: Secondary | ICD-10-CM | POA: Diagnosis present

## 2016-05-14 DIAGNOSIS — K760 Fatty (change of) liver, not elsewhere classified: Secondary | ICD-10-CM | POA: Diagnosis present

## 2016-05-14 DIAGNOSIS — R011 Cardiac murmur, unspecified: Secondary | ICD-10-CM | POA: Diagnosis present

## 2016-05-14 DIAGNOSIS — Z91010 Allergy to peanuts: Secondary | ICD-10-CM | POA: Diagnosis not present

## 2016-05-14 LAB — BASIC METABOLIC PANEL
ANION GAP: 13 (ref 5–15)
Anion gap: 20 — ABNORMAL HIGH (ref 5–15)
Anion gap: 14 (ref 5–15)
Anion gap: 9 (ref 5–15)
Anion gap: 8 (ref 5–15)
BUN: 18 mg/dL (ref 6–20)
BUN: 13 mg/dL (ref 6–20)
BUN: 11 mg/dL (ref 6–20)
BUN: 10 mg/dL (ref 6–20)
BUN: 9 mg/dL (ref 6–20)
CHLORIDE: 105 mmol/L (ref 101–111)
CHLORIDE: 105 mmol/L (ref 101–111)
CHLORIDE: 106 mmol/L (ref 101–111)
CHLORIDE: 105 mmol/L (ref 101–111)
CHLORIDE: 106 mmol/L (ref 101–111)
CO2: 7 mmol/L — ABNORMAL LOW (ref 22–32)
CO2: 10 mmol/L — ABNORMAL LOW (ref 22–32)
CO2: 11 mmol/L — ABNORMAL LOW (ref 22–32)
CO2: 16 mmol/L — ABNORMAL LOW (ref 22–32)
CO2: 17 mmol/L — ABNORMAL LOW (ref 22–32)
Calcium: 8.5 mg/dL — ABNORMAL LOW (ref 8.9–10.3)
Calcium: 8.5 mg/dL — ABNORMAL LOW (ref 8.9–10.3)
Calcium: 8.5 mg/dL — ABNORMAL LOW (ref 8.9–10.3)
Calcium: 8.5 mg/dL — ABNORMAL LOW (ref 8.9–10.3)
Calcium: 8.6 mg/dL — ABNORMAL LOW (ref 8.9–10.3)
Creatinine, Ser: 1.13 mg/dL — ABNORMAL HIGH (ref 0.44–1.00)
Creatinine, Ser: 0.8 mg/dL (ref 0.44–1.00)
Creatinine, Ser: 0.72 mg/dL (ref 0.44–1.00)
Creatinine, Ser: 0.69 mg/dL (ref 0.44–1.00)
Creatinine, Ser: 0.56 mg/dL (ref 0.44–1.00)
GFR calc Af Amer: >60 mL/min (ref >60)
GFR calc non Af Amer: >60 mL/min (ref >60)
GFR calc non Af Amer: >60 mL/min (ref >60)
GFR calc non Af Amer: >60 mL/min (ref >60)
GFR calc non Af Amer: >60 mL/min (ref >60)
GLUCOSE: 162 mg/dL — AB (ref 65–99)
Glucose, Bld: 301 mg/dL — ABNORMAL HIGH (ref 65–99)
Glucose, Bld: 167 mg/dL — ABNORMAL HIGH (ref 65–99)
Glucose, Bld: 182 mg/dL — ABNORMAL HIGH (ref 65–99)
Glucose, Bld: 166 mg/dL — ABNORMAL HIGH (ref 65–99)
POTASSIUM: 4.7 mmol/L (ref 3.5–5.1)
POTASSIUM: 4.5 mmol/L (ref 3.5–5.1)
POTASSIUM: 4 mmol/L (ref 3.5–5.1)
POTASSIUM: 3.9 mmol/L (ref 3.5–5.1)
Potassium: 3.5 mmol/L (ref 3.5–5.1)
SODIUM: 132 mmol/L — AB (ref 135–145)
SODIUM: 129 mmol/L — AB (ref 135–145)
SODIUM: 130 mmol/L — AB (ref 135–145)
SODIUM: 130 mmol/L — AB (ref 135–145)
SODIUM: 131 mmol/L — AB (ref 135–145)

## 2016-05-14 LAB — GLUCOSE, CAPILLARY
GLUCOSE-CAPILLARY: 392 mg/dL — AB (ref 65–99)
GLUCOSE-CAPILLARY: 156 mg/dL — AB (ref 65–99)
GLUCOSE-CAPILLARY: 151 mg/dL — AB (ref 65–99)
GLUCOSE-CAPILLARY: 163 mg/dL — AB (ref 65–99)
GLUCOSE-CAPILLARY: 180 mg/dL — AB (ref 65–99)
GLUCOSE-CAPILLARY: 151 mg/dL — AB (ref 65–99)
GLUCOSE-CAPILLARY: 159 mg/dL — AB (ref 65–99)
Glucose-Capillary: 360 mg/dL — ABNORMAL HIGH (ref 65–99)
Glucose-Capillary: 254 mg/dL — ABNORMAL HIGH (ref 65–99)
Glucose-Capillary: 174 mg/dL — ABNORMAL HIGH (ref 65–99)
Glucose-Capillary: 161 mg/dL — ABNORMAL HIGH (ref 65–99)
Glucose-Capillary: 161 mg/dL — ABNORMAL HIGH (ref 65–99)
Glucose-Capillary: 146 mg/dL — ABNORMAL HIGH (ref 65–99)
Glucose-Capillary: 172 mg/dL — ABNORMAL HIGH (ref 65–99)
Glucose-Capillary: 158 mg/dL — ABNORMAL HIGH (ref 65–99)
Glucose-Capillary: 158 mg/dL — ABNORMAL HIGH (ref 65–99)
Glucose-Capillary: 180 mg/dL — ABNORMAL HIGH (ref 65–99)
Glucose-Capillary: 175 mg/dL — ABNORMAL HIGH (ref 65–99)
Glucose-Capillary: 161 mg/dL — ABNORMAL HIGH (ref 65–99)

## 2016-05-14 LAB — TROPONIN I
Troponin I: <0.03 ng/mL (ref <0.03)
Troponin I: <0.03 ng/mL (ref <0.03)

## 2016-05-14 LAB — MAGNESIUM: MAGNESIUM: 1.7 mg/dL (ref 1.7–2.4)

## 2016-05-14 LAB — RAPID URINE DRUG SCREEN, HOSP PERFORMED
Amphetamines: NOT DETECTED
BARBITURATES: NOT DETECTED
BENZODIAZEPINES: NOT DETECTED
COCAINE: NOT DETECTED
Opiates: NOT DETECTED
TETRAHYDROCANNABINOL: NOT DETECTED

## 2016-05-14 LAB — PHOSPHORUS: Phosphorus: 2.5 mg/dL (ref 2.5–4.6)

## 2016-05-14 LAB — BETA-HYDROXYBUTYRIC ACID

## 2016-05-14 LAB — PREGNANCY, URINE: Preg Test, Ur: NEGATIVE

## 2016-05-14 MED ORDER — INSULIN REGULAR HUMAN 100 UNIT/ML IJ SOLN
INTRAMUSCULAR | Status: DC
  Administered 2016-05-14: 3.9 [IU]/h via INTRAVENOUS
  Administered 2016-05-15: 2 [IU]/h via INTRAVENOUS
  Filled 2016-05-14: qty 2.5

## 2016-05-14 MED ORDER — KETOROLAC TROMETHAMINE 30 MG/ML IJ SOLN
30.0000 mg | Freq: Four times a day (QID) | INTRAMUSCULAR | Status: DC | PRN
  Administered 2016-05-14 – 2016-05-15 (×4): 30 mg via INTRAVENOUS
  Filled 2016-05-14 (×4): qty 1

## 2016-05-14 MED ORDER — ENOXAPARIN SODIUM 40 MG/0.4ML ~~LOC~~ SOLN
40.0000 mg | Freq: Every day | SUBCUTANEOUS | Status: DC
  Administered 2016-05-14 – 2016-05-15 (×3): 40 mg via SUBCUTANEOUS
  Filled 2016-05-14 (×3): qty 0.4

## 2016-05-14 MED ORDER — POTASSIUM CHLORIDE 10 MEQ/100ML IV SOLN
10.0000 meq | INTRAVENOUS | Status: AC
  Administered 2016-05-14 (×3): 10 meq via INTRAVENOUS
  Filled 2016-05-14 (×3): qty 100

## 2016-05-14 MED ORDER — PANTOPRAZOLE SODIUM 40 MG PO TBEC
40.0000 mg | DELAYED_RELEASE_TABLET | Freq: Two times a day (BID) | ORAL | Status: DC
  Administered 2016-05-14 – 2016-05-16 (×5): 40 mg via ORAL
  Filled 2016-05-14 (×5): qty 1

## 2016-05-14 MED ORDER — SODIUM CHLORIDE 0.9 % IV SOLN: INTRAVENOUS | Status: DC

## 2016-05-14 MED ORDER — METOCLOPRAMIDE HCL 5 MG/ML IJ SOLN
10.0000 mg | Freq: Four times a day (QID) | INTRAMUSCULAR | Status: DC
  Administered 2016-05-14 – 2016-05-16 (×11): 10 mg via INTRAVENOUS
  Filled 2016-05-14 (×11): qty 2

## 2016-05-14 MED ORDER — SODIUM CHLORIDE 0.9 % IV SOLN: 30.0000 meq | Freq: Once | INTRAVENOUS | Status: DC

## 2016-05-14 MED ORDER — IBUPROFEN 200 MG PO TABS
600.0000 mg | ORAL_TABLET | Freq: Once | ORAL | Status: AC
  Administered 2016-05-14: 600 mg via ORAL
  Filled 2016-05-14: qty 3

## 2016-05-14 MED ORDER — SODIUM CHLORIDE 0.9 % IV BOLUS (SEPSIS): 1000.0000 mL | Freq: Once | INTRAVENOUS | Status: AC

## 2016-05-14 MED ORDER — MOMETASONE FURO-FORMOTEROL FUM 200-5 MCG/ACT IN AERO
2.0000 | INHALATION_SPRAY | Freq: Two times a day (BID) | RESPIRATORY_TRACT | Status: DC
  Administered 2016-05-14 – 2016-05-15 (×2): 2 via RESPIRATORY_TRACT
  Filled 2016-05-14: qty 8.8

## 2016-05-14 MED ORDER — ALBUTEROL SULFATE (2.5 MG/3ML) 0.083% IN NEBU: 2.5000 mg | INHALATION_SOLUTION | Freq: Four times a day (QID) | RESPIRATORY_TRACT | Status: DC | PRN

## 2016-05-14 MED ORDER — DEXTROSE-NACL 5-0.45 % IV SOLN
INTRAVENOUS | Status: DC
  Administered 2016-05-14 – 2016-05-15 (×3): via INTRAVENOUS

## 2016-05-14 MED ORDER — ESCITALOPRAM OXALATE 10 MG PO TABS
5.0000 mg | ORAL_TABLET | Freq: Every day | ORAL | Status: DC
  Administered 2016-05-14 – 2016-05-16 (×3): 5 mg via ORAL
  Filled 2016-05-14 (×3): qty 1

## 2016-05-14 MED ORDER — SODIUM CHLORIDE 0.9 % IV SOLN: INTRAVENOUS | Status: AC

## 2016-05-14 MED ORDER — FAMOTIDINE 20 MG PO TABS
20.0000 mg | ORAL_TABLET | Freq: Every day | ORAL | Status: DC
  Administered 2016-05-14 – 2016-05-15 (×2): 20 mg via ORAL
  Filled 2016-05-14 (×2): qty 1

## 2016-05-14 NOTE — ED Notes (Signed)
Transporting patient to 1235.

## 2016-05-14 NOTE — ED Notes (Signed)
Spoke with ICU primary nurse, 20 Minute timer started.

## 2016-05-14 NOTE — Progress Notes (Signed)
Date:  May 14, 2016 Chart reviewed for concurrent status and case management needs. Will continue to follow patient progress. Na-123 this am/iv insulin drip restarted [id number] d5w at 125cc/hrs/remains as observations status. Discharge Planning: following for needs Expected discharge date: [id number] Zacari Stiff, BSN, [place], [place]

## 2016-05-14 NOTE — Progress Notes (Signed)
Initial Nutrition Assessment  DOCUMENTATION CODES:   Not applicable  INTERVENTION:  - Diet advancement as medically feasible. - Will monitor for needs with diet advancement.   NUTRITION DIAGNOSIS:   Inadequate oral intake related to inability to eat as evidenced by NPO status.  GOAL:   Patient will meet greater than or equal to 90% of their needs  MONITOR:   Diet advancement, Weight trends, Labs  REASON FOR ASSESSMENT:   Malnutrition Screening Tool  ASSESSMENT:   26 y.o. female with medical history significant of diabetes mellitus type 1, recurrent DKA recent pancreatitis, liver adenomas, anxiety, hepatic steatosis, asthma. Presented with nausea vomiting and abdominal pain she was just discharged from hospital 3 days ago for DKA on arrival heart rate 140.  Pt seen for MST. BMI indicates normal weight. Pt has been NPO since admission and unable to meet estimated nutrition needs. Pt was d/c'ed from the hospital on 4/22 and re-admitted 4/25 for recurrent DKA. Pt known to this RD from previous admissions over the past 6 months.   Physical assessment shows no muscle and no fat wasting at this time. Pt unsure of weight trends recently but reports decreased appetite during last admission and in the days between hospitalizations. Per chart review, weight 116 lbs on 02/20/16 and up to 125 lbs on 02/28/16. She then was 124 lbs on 05/09/16 and now 117 lbs from yesterday. Will continue to monitor weight trends closely. Unable to state malnutrition at this time although pt is high risk based on recurrent hospitalizations, decreased appetite and intakes.   Medications reviewed; 20 mg oral Pepcid/day, 10 mg IV Reglan QID, 40 mg oral Protonix BID, 10 mEq IV KCl x3 runs today. Labs reviewed; CBGs: 146-392 mg/dL since midnight, Na: 130 mmol/L, Ca: 8.5 mg/dL.  IVF: D5-1/2 NS @ 125 mL/hr (510 kcal from dextrose)   Diet Order:  Diet NPO time specified  Skin:  Reviewed, no issues  Last BM:  4/23  (PTA)  Height:   Ht Readings from Last 1 Encounters:  05/14/16 5\' 4"  (1.626 m)    Weight:   Wt Readings from Last 1 Encounters:  05/14/16 117 lb 15.1 oz (53.5 kg)    Ideal Body Weight:  54.54 kg  BMI:  Body mass index is 20.25 kg/m.  Estimated Nutritional Needs:   Kcal:  1350-1605 (25-30 kcal/kg)  Protein:  60-70 grams  Fluid:  >/= 1.7 L/day  EDUCATION NEEDS:   Education needs no appropriate at this time    Jarome Matin, MS, RD, LDN, [place] Inpatient Clinical Dietitian Pager # [phone] After hours/weekend pager # [phone]

## 2016-05-14 NOTE — Progress Notes (Signed)
PROGRESS NOTE    [name]  VVO:[id number] DOB: Mar 12, 1990 DOA: 05/13/2016 PCP: Vicenta Aly, FNP    Brief Narrative:  26 y.o. female with medical history significant of diabetes mellitus type 1, recurrent DKA recent pancreatitis, liver adenomas, anxiety, hepatic steatosis, asthma    Presented with nausea vomiting and abdominal pain she was just discharged from hospital 3 days ago for DKA on arrival heart rate 140 Reports abdominal pain has been getting worse over past few days has but sugar has been in 200s pain is dull and constant no associated diarrhea and vomiting symptoms makes pain worse  Initially hospitalist was called for patient but thought that this is not consistent with DKA recommended IV fluids despite aggressive fluid resuscitation patient's bicarbonate has went down from 22-17 she feels better regarding nausea and vomiting that continue to be tachycardic at which point patient was admitted for further management. deneis any fever no chest pain no shortness of breath. Denies medication noncompliance reports she just started to feel worse soon after she was discharged to home had decreased PO intake and increased abdominal discomfort.  Assessment & Plan:   Active Problems:   Asthma   DKA, type 1 ([place])   Diabetic ketoacidosis without coma associated with type 1 diabetes mellitus (HCC)   Chronic diastolic heart failure (HCC)   Dehydration   Diabetic gastroparesis (HCC)   Intractable vomiting with nausea   GERD (gastroesophageal reflux disease)   Acute urinary retention   Depression   Hematuria  . DKA, type 1, recurrent ([place])  - Patient has been continued on glucosestabalizer, aggressive IVF - Still acidotic. Anticipate transition to subq insulin when GAP is closed and when serum bicarb is >20  . Chronic diastolic heart failure ([place])  - currently severely dehydrated  - Continue with aggressive hydration as tolerated  . Diabetic gastroparesis ([place])  -  Will continue on scheduled Reglan parietal avoid opioids - appears stable at present   . Asthma - Currently stable - Will continue with PRN nebs continue a bronchodilators as needed - No active wheezing at present  . Hematuria  - suspect secondary to bladder chronic distention and irritation - Urine culture pending - Will repeat CBC in AM  . Intractable vomiting with nausea likely secondary to gastroparesis secondary to diabetes and opioids - Cont to avoid opioid medications - Continue with Reglan as tolerated  . Depression - Appears stable at present - Will continue home medications  . GERD (gastroesophageal reflux disease) - Presently stable - Will continue home medications as tolerated  . Acute urinary retention - foley cath placed at time of admit - consider voiding trial when clinically improved  DVT prophylaxis: Lovenox subQ Code Status: Full Family Communication: Pt in room, family not at bedside Disposition Plan: Uncertain at this time  Consultants:     Procedures:     Antimicrobials: Anti-infectives    None       Subjective: Complaining of diffuse pain  Objective: Vitals:   05/14/16 1200 05/14/16 1300 05/14/16 1400 05/14/16 1500  BP: 133/77 127/75 133/83 131/77  Pulse: (!) 118 (!) 118 (!) 116 (!) 117  Resp: 20 16 18 18   Temp: 98.3 F (36.8 C)     TempSrc: Oral     SpO2: 100% 100% 100% 100%  Weight:      Height:        Intake/Output Summary (Last 24 hours) at 05/14/16 1513 Last data filed at 05/14/16 1500  Gross per 24 hour  Intake  3709.82 ml  Output             1950 ml  Net          1759.82 ml   Filed Weights   05/14/16 0530 05/14/16 0800  Weight: 53.5 kg (117 lb 15.1 oz) 53.5 kg (117 lb 15.1 oz)    Examination:  General exam: Appears calm and comfortable  Respiratory system: Clear to auscultation. Respiratory effort normal. Cardiovascular system: S1 & S2 heard, RRR Gastrointestinal system: Abdomen is nondistended,  soft and nontender. No organomegaly or masses felt. Normal bowel sounds heard. Central nervous system: Alert and oriented. No focal neurological deficits. Extremities: Symmetric 5 x 5 power. Skin: No rashes, lesions Psychiatry: Judgement and insight appear normal. Mood & affect appropriate.   Data Reviewed: I have personally reviewed following labs and imaging studies  CBC:  Recent Labs Lab 05/13/16 1408  WBC 14.8*  NEUTROABS 10.3*  HGB 15.9*  HCT 46.2*  MCV 88.5  PLT 448*   Basic Metabolic Panel:  Recent Labs Lab 05/13/16 1752 05/14/16 0238 05/14/16 0805 05/14/16 1104 05/14/16 1400  NA 135 132* 129* 130* 130*  K 3.5 4.7 4.5 4.0 3.9  CL 100* 105 105 106 105  CO2 17* 7* 10* 11* 16*  GLUCOSE 206* 301* 167* 182* 166*  BUN 20 18 13 11 10   CREATININE 0.96 1.13* 0.80 0.72 0.69  CALCIUM 8.5* 8.5* 8.5* 8.5* 8.5*  MG  --  1.7  --   --   --   PHOS  --  2.5  --   --   --    GFR: Estimated Creatinine Clearance: 90 mL/min (by C-G formula based on SCr of 0.69 mg/dL). Liver Function Tests:  Recent Labs Lab 05/13/16 1408 05/13/16 1752  AST 18 15  ALT 13* 11*  ALKPHOS 136* 105  BILITOT 1.0 1.2  PROT 9.5* 7.7  ALBUMIN 4.8 3.8    Recent Labs Lab 05/13/16 1408  LIPASE 11   No results for input(s): AMMONIA in the last 168 hours. Coagulation Profile: No results for input(s): INR, PROTIME in the last 168 hours. Cardiac Enzymes:  Recent Labs Lab 05/14/16 0240 05/14/16 0805 05/14/16 1400  TROPONINI <0.03 <0.03 <0.03   BNP (last 3 results) No results for input(s): PROBNP in the last 8760 hours. HbA1C: No results for input(s): HGBA1C in the last 72 hours. CBG:  Recent Labs Lab 05/14/16 0730 05/14/16 0839 05/14/16 0959 05/14/16 1147 05/14/16 1254  GLUCAP 156* 146* 151* 172* 158*   Lipid Profile: No results for input(s): CHOL, HDL, LDLCALC, TRIG, CHOLHDL, LDLDIRECT in the last 72 hours. Thyroid Function Tests: No results for input(s): TSH, T4TOTAL,  FREET4, T3FREE, THYROIDAB in the last 72 hours. Anemia Panel: No results for input(s): VITAMINB12, FOLATE, FERRITIN, TIBC, IRON, RETICCTPCT in the last 72 hours. Sepsis Labs: No results for input(s): PROCALCITON, LATICACIDVEN in the last 168 hours.  Recent Results (from the past 240 hour(s))  Urine culture     Status: Abnormal   Collection Time: 05/04/16  5:45 PM  Result Value Ref Range Status   Specimen Description URINE, RANDOM  Final   Special Requests NONE  Final   Culture MULTIPLE SPECIES PRESENT, SUGGEST RECOLLECTION (A)  Final   Report Status 05/06/2016 FINAL  Final  MRSA PCR Screening     Status: None   Collection Time: 05/04/16 11:00 PM  Result Value Ref Range Status   MRSA by PCR NEGATIVE NEGATIVE Final    Comment:  The GeneXpert MRSA Assay (FDA approved for NASAL specimens only), is one component of a comprehensive MRSA colonization surveillance program. It is not intended to diagnose MRSA infection nor to guide or monitor treatment for MRSA infections.      Radiology Studies: Portable Chest X-ray (1 View)  Result Date: 05/14/2016 CLINICAL DATA:  DKA.  Shortness of breath . EXAM: PORTABLE CHEST 1 VIEW COMPARISON:  05/04/2016 . FINDINGS: Mediastinum and hilar structures are normal. Heart size stable. Lungs are clear. No pleural effusion or pneumothorax . IMPRESSION: No acute abnormality. Electronically Signed   By: Marcello Moores  Register   On: 05/14/2016 07:20    Scheduled Meds: . enoxaparin (LOVENOX) injection  40 mg Subcutaneous QHS  . escitalopram  5 mg Oral Daily  . famotidine  20 mg Oral QHS  . metoCLOPramide (REGLAN) injection  10 mg Intravenous Q6H  . mometasone-formoterol  2 puff Inhalation BID  . pantoprazole  40 mg Oral BID   Continuous Infusions: . sodium chloride    . sodium chloride    . dextrose 5 % and 0.45% NaCl 125 mL/hr at 05/14/16 1512  . insulin (NOVOLIN-R) infusion 2 Units/hr (05/14/16 1430)     LOS: 0 days   CHIU, Orpah Melter,  MD [facility] Pager [phone]  If 7PM-7AM, please contact night-coverage [email or web address] Password [facility] 05/14/2016, 3:13 PM

## 2016-05-14 NOTE — Progress Notes (Signed)
Inpatient Diabetes Program Recommendations  AACE/ADA: New Consensus Statement on Inpatient Glycemic Control (2015)  Target Ranges:  Prepandial:   less than 140 mg/dL      Peak postprandial:   less than 180 mg/dL (1-2 hours)      Critically ill patients:  140 - 180 mg/dL   Lab Results  Component Value Date   GLUCAP 151 (H) 05/14/2016   HGBA1C 8.9 (H) 03/01/2016   LATE ENTRY Review of Glycemic Control  7th admission and 2 ED visits in past 6 months. Very well known to Inpatient Diabetes Team.   Diabetes history: DM1 Outpatient Diabetes medications: Lantus 25 units QHS, Novolog 1:10 CHO ratio and CF 50. Current orders for Inpatient glycemic control: IV insulin per GlucoStabilizer  When criteria met to transition to SQ insulin, give Lantus 2 hours prior to discontinuing drip.   Inpatient Diabetes Program Recommendations:    Lantus 18 units Q24H Novolog 5 units tidwc + 0-9 units Q4H x 12H, then tidwc and hs.  Spoke with pt regarding her recent hospitalizations for DKA. Pt states "I really do take my insulin." Inpatient Diabetes Coordinators have discussed importance of checking blood sugars, taking insulin as prescribed, and f/u with PCP for management. Pt instructed to bring glucose logs and meter to each visit. Pt also states she does not have any problems in getting her insulin or monitoring supplies.  Will continue to follow.  Thank you. Lorenda Peck, RD, LDN, CDE Inpatient Diabetes Coordinator [phone]

## 2016-05-15 DIAGNOSIS — K3184 Gastroparesis: Secondary | ICD-10-CM

## 2016-05-15 DIAGNOSIS — E1010 Type 1 diabetes mellitus with ketoacidosis without coma: Principal | ICD-10-CM

## 2016-05-15 LAB — BASIC METABOLIC PANEL
ANION GAP: 7 (ref 5–15)
Anion gap: 7 (ref 5–15)
BUN: 7 mg/dL (ref 6–20)
BUN: 5 mg/dL — ABNORMAL LOW (ref 6–20)
CALCIUM: 8.5 mg/dL — AB (ref 8.9–10.3)
CO2: 18 mmol/L — ABNORMAL LOW (ref 22–32)
CO2: 20 mmol/L — ABNORMAL LOW (ref 22–32)
Calcium: 8.5 mg/dL — ABNORMAL LOW (ref 8.9–10.3)
Chloride: 106 mmol/L (ref 101–111)
Chloride: 109 mmol/L (ref 101–111)
Creatinine, Ser: 0.49 mg/dL (ref 0.44–1.00)
Creatinine, Ser: 0.46 mg/dL (ref 0.44–1.00)
GFR calc Af Amer: >60 mL/min (ref >60)
GLUCOSE: 167 mg/dL — AB (ref 65–99)
Glucose, Bld: 151 mg/dL — ABNORMAL HIGH (ref 65–99)
POTASSIUM: 3 mmol/L — AB (ref 3.5–5.1)
Potassium: 3.1 mmol/L — ABNORMAL LOW (ref 3.5–5.1)
Sodium: 131 mmol/L — ABNORMAL LOW (ref 135–145)
Sodium: 136 mmol/L (ref 135–145)

## 2016-05-15 LAB — CBC
HCT: 34.4 % — ABNORMAL LOW (ref 36.0–46.0)
Hemoglobin: 12 g/dL (ref 12.0–15.0)
MCH: 30.1 pg (ref 26.0–34.0)
MCHC: 34.9 g/dL (ref 30.0–36.0)
MCV: 86.2 fL (ref 78.0–100.0)
PLATELETS: 366 10*3/uL (ref 150–400)
RBC: 3.99 MIL/uL (ref 3.87–5.11)
RDW: 12.9 % (ref 11.5–15.5)
WBC: 5.6 10*3/uL (ref 4.0–10.5)

## 2016-05-15 LAB — GLUCOSE, CAPILLARY
GLUCOSE-CAPILLARY: 115 mg/dL — AB (ref 65–99)
GLUCOSE-CAPILLARY: 121 mg/dL — AB (ref 65–99)
GLUCOSE-CAPILLARY: 162 mg/dL — AB (ref 65–99)
GLUCOSE-CAPILLARY: 198 mg/dL — AB (ref 65–99)
GLUCOSE-CAPILLARY: 271 mg/dL — AB (ref 65–99)
Glucose-Capillary: 140 mg/dL — ABNORMAL HIGH (ref 65–99)
Glucose-Capillary: 146 mg/dL — ABNORMAL HIGH (ref 65–99)
Glucose-Capillary: 159 mg/dL — ABNORMAL HIGH (ref 65–99)
Glucose-Capillary: 259 mg/dL — ABNORMAL HIGH (ref 65–99)
Glucose-Capillary: 165 mg/dL — ABNORMAL HIGH (ref 65–99)
Glucose-Capillary: 203 mg/dL — ABNORMAL HIGH (ref 65–99)
Glucose-Capillary: 238 mg/dL — ABNORMAL HIGH (ref 65–99)

## 2016-05-15 LAB — URINE CULTURE: Culture: >=100000 — AB

## 2016-05-15 MED ORDER — INSULIN ASPART 100 UNIT/ML ~~LOC~~ SOLN: 4.0000 [IU] | Freq: Three times a day (TID) | SUBCUTANEOUS | Status: DC

## 2016-05-15 MED ORDER — POTASSIUM CHLORIDE CRYS ER 20 MEQ PO TBCR
40.0000 meq | EXTENDED_RELEASE_TABLET | Freq: Two times a day (BID) | ORAL | Status: AC
  Administered 2016-05-15 (×2): 40 meq via ORAL
  Filled 2016-05-15 (×2): qty 2

## 2016-05-15 MED ORDER — INSULIN ASPART 100 UNIT/ML ~~LOC~~ SOLN
0.0000 [IU] | Freq: Three times a day (TID) | SUBCUTANEOUS | Status: DC
  Administered 2016-05-15: 8 [IU] via SUBCUTANEOUS
  Administered 2016-05-15: 5 [IU] via SUBCUTANEOUS
  Administered 2016-05-16: 3 [IU] via SUBCUTANEOUS
  Administered 2016-05-16: 5 [IU] via SUBCUTANEOUS

## 2016-05-15 MED ORDER — INSULIN ASPART 100 UNIT/ML ~~LOC~~ SOLN
4.0000 [IU] | Freq: Three times a day (TID) | SUBCUTANEOUS | Status: DC
  Administered 2016-05-15 (×2): 4 [IU] via SUBCUTANEOUS

## 2016-05-15 MED ORDER — INSULIN ASPART 100 UNIT/ML ~~LOC~~ SOLN
0.0000 [IU] | Freq: Every day | SUBCUTANEOUS | Status: DC
  Administered 2016-05-15: 2 [IU] via SUBCUTANEOUS

## 2016-05-15 MED ORDER — INSULIN GLARGINE 100 UNIT/ML ~~LOC~~ SOLN
25.0000 [IU] | Freq: Every day | SUBCUTANEOUS | Status: DC
  Administered 2016-05-15: 25 [IU] via SUBCUTANEOUS
  Filled 2016-05-15 (×2): qty 0.25

## 2016-05-15 NOTE — Progress Notes (Signed)
Glucostabilizer order set discontinued. Lantus given and glucostabilizer continued for 2 hours with Q1H CBG checks. IVF discontinued and carb mod diet started. 2U insulin given to cover 20g carb intake. Glucostabilizer stopped and 9 units insulin given at 1141 to cover lunchtime CBG of 203. Pt has complaints at this time. Will continue to monitor.

## 2016-05-15 NOTE — Progress Notes (Signed)
PROGRESS NOTE    [name]  TDV:[id number] DOB: 01-Oct-1990 DOA: 05/13/2016 PCP: Vicenta Aly, FNP    Brief Narrative:  26 y.o. female with medical history significant of diabetes mellitus type 1, recurrent DKA recent pancreatitis, liver adenomas, anxiety, hepatic steatosis, asthma    Presented with nausea vomiting and abdominal pain she was just discharged from hospital 3 days ago for DKA on arrival heart rate 140 Reports abdominal pain has been getting worse over past few days has but sugar has been in 200s pain is dull and constant no associated diarrhea and vomiting symptoms makes pain worse  Initially hospitalist was called for patient but thought that this is not consistent with DKA recommended IV fluids despite aggressive fluid resuscitation patient's bicarbonate has went down from 22-17 she feels better regarding nausea and vomiting that continue to be tachycardic at which point patient was admitted for further management. deneis any fever no chest pain no shortness of breath. Denies medication noncompliance reports she just started to feel worse soon after she was discharged to home had decreased PO intake and increased abdominal discomfort.  Assessment & Plan:   Active Problems:   Asthma   DKA, type 1 ([place])   Diabetic ketoacidosis without coma associated with type 1 diabetes mellitus (HCC)   Chronic diastolic heart failure (HCC)   Dehydration   Diabetic gastroparesis (HCC)   Intractable vomiting with nausea   GERD (gastroesophageal reflux disease)   Acute urinary retention   Depression   Hematuria  . DKA, type 1, recurrent ([place])  - Patient has been continued on glucosestabalizer, aggressive IVF - GAP has closed, acidosis improved. Patient transitioned off insulin gtt and onto subQ insulin - Continue on SSI coverage  . Chronic diastolic heart failure (HCC)  - presented dehydrated - will hold further fluids for now as pt is no longer NPO  . Diabetic  gastroparesis ([place])  - Will continue on scheduled Reglan parietal avoid opioids - presently stable   . Asthma - Presently stable - Will continue with PRN nebs continue a bronchodilators as needed - No active wheezing at present  . Hematuria  - suspect secondary to bladder chronic distention and irritation - Urine culture pending - Recheck CBC in AM  . Intractable vomiting with nausea likely secondary to gastroparesis secondary to diabetes and opioids - Cont to avoid opioid medications - Plan to continue with Reglan as tolerated  . Depression - Appears stable at present - Plan to continue home medications  . GERD (gastroesophageal reflux disease) - Presently stable - Continue with home medications as tolerated  . Acute urinary retention - foley cath placed at time of admit - Will consider voiding trial when clinically improved  DVT prophylaxis: Lovenox subQ Code Status: Full Family Communication: Pt in room, family not at bedside Disposition Plan: Uncertain at this time  Consultants:     Procedures:     Antimicrobials: Anti-infectives    None      Subjective: Reports feeling better today  Objective: Vitals:   05/15/16 0600 05/15/16 0800 05/15/16 0900 05/15/16 1000  BP:  122/85 116/82 120/83  Pulse: (!) 109 (!) 104 95 (!) 103  Resp: 17 16 12 15   Temp:  98.6 F (37 C)    TempSrc:  Oral    SpO2: 100% 100% 100% 100%  Weight:      Height:        Intake/Output Summary (Last 24 hours) at 05/15/16 1435 Last data filed at 05/15/16 0600  Gross per 24  hour  Intake          2922.87 ml  Output             3000 ml  Net           -77.13 ml   Filed Weights   05/14/16 0530 05/14/16 0800  Weight: 53.5 kg (117 lb 15.1 oz) 53.5 kg (117 lb 15.1 oz)    Examination:  General exam: laying in bed, awake, in nad Respiratory system: normal resp effort, no audible wheezing Cardiovascular system: regular rate, s1, s2 Gastrointestinal system: soft, nondistended,  pos BS Central nervous system: cn2-12 grossly intact, strength intact Extremities: no clubbing, no cyanosis Skin: normal skin turgor, no notable skin lesions seen Psychiatry: mood normal// no visual hallucinations   Data Reviewed: I have personally reviewed following labs and imaging studies  CBC:  Recent Labs Lab 05/13/16 1408 05/15/16 0409  WBC 14.8* 5.6  NEUTROABS 10.3*  --   HGB 15.9* 12.0  HCT 46.2* 34.4*  MCV 88.5 86.2  PLT 547* 295   Basic Metabolic Panel:  Recent Labs Lab 05/14/16 0238  05/14/16 1104 05/14/16 1400 05/14/16 1857 05/15/16 0041 05/15/16 0409  NA 132*  < > 130* 130* 131* 131* 136  K 4.7  < > 4.0 3.9 3.5 3.0* 3.1*  CL 105  < > 106 105 106 106 109  CO2 7*  < > 11* 16* 17* 18* 20*  GLUCOSE 301*  < > 182* 166* 162* 167* 151*  BUN 18  < > 11 10 9 7  5*  CREATININE 1.13*  < > 0.72 0.69 0.56 0.49 0.46  CALCIUM 8.5*  < > 8.5* 8.5* 8.6* 8.5* 8.5*  MG 1.7  --   --   --   --   --   --   PHOS 2.5  --   --   --   --   --   --   < > = values in this interval not displayed. GFR: Estimated Creatinine Clearance: 90 mL/min (by C-G formula based on SCr of 0.46 mg/dL). Liver Function Tests:  Recent Labs Lab 05/13/16 1408 05/13/16 1752  AST 18 15  ALT 13* 11*  ALKPHOS 136* 105  BILITOT 1.0 1.2  PROT 9.5* 7.7  ALBUMIN 4.8 3.8    Recent Labs Lab 05/13/16 1408  LIPASE 11   No results for input(s): AMMONIA in the last 168 hours. Coagulation Profile: No results for input(s): INR, PROTIME in the last 168 hours. Cardiac Enzymes:  Recent Labs Lab 05/14/16 0240 05/14/16 0805 05/14/16 1400  TROPONINI <0.03 <0.03 <0.03   BNP (last 3 results) No results for input(s): PROBNP in the last 8760 hours. HbA1C: No results for input(s): HGBA1C in the last 72 hours. CBG:  Recent Labs Lab 05/15/16 0649 05/15/16 0752 05/15/16 0856 05/15/16 1010 05/15/16 1120  GLUCAP 259* 198* 165* 115* 203*   Lipid Profile: No results for input(s): CHOL, HDL,  LDLCALC, TRIG, CHOLHDL, LDLDIRECT in the last 72 hours. Thyroid Function Tests: No results for input(s): TSH, T4TOTAL, FREET4, T3FREE, THYROIDAB in the last 72 hours. Anemia Panel: No results for input(s): VITAMINB12, FOLATE, FERRITIN, TIBC, IRON, RETICCTPCT in the last 72 hours. Sepsis Labs: No results for input(s): PROCALCITON, LATICACIDVEN in the last 168 hours.  Recent Results (from the past 240 hour(s))  Urine culture     Status: Abnormal   Collection Time: 05/14/16 12:45 AM  Result Value Ref Range Status   Specimen Description URINE, CATHETERIZED  Final  Special Requests NONE  Final   Culture >=100,000 COLONIES/mL YEAST (A)  Final   Report Status 05/15/2016 FINAL  Final     Radiology Studies: Portable Chest X-ray (1 View)  Result Date: 05/14/2016 CLINICAL DATA:  DKA.  Shortness of breath . EXAM: PORTABLE CHEST 1 VIEW COMPARISON:  05/04/2016 . FINDINGS: Mediastinum and hilar structures are normal. Heart size stable. Lungs are clear. No pleural effusion or pneumothorax . IMPRESSION: No acute abnormality. Electronically Signed   By: Marcello Moores  Register   On: 05/14/2016 07:20    Scheduled Meds: . enoxaparin (LOVENOX) injection  40 mg Subcutaneous QHS  . escitalopram  5 mg Oral Daily  . famotidine  20 mg Oral QHS  . insulin aspart  0-15 Units Subcutaneous TID WC  . insulin aspart  0-5 Units Subcutaneous QHS  . insulin aspart  4 Units Subcutaneous TID WC  . insulin glargine  25 Units Subcutaneous Daily  . metoCLOPramide (REGLAN) injection  10 mg Intravenous Q6H  . mometasone-formoterol  2 puff Inhalation BID  . pantoprazole  40 mg Oral BID  . potassium chloride  40 mEq Oral BID   Continuous Infusions: . insulin (NOVOLIN-R) infusion Stopped (05/15/16 1141)     LOS: 1 day   Nancy Manuele, Orpah Melter, MD [facility] Pager [phone]  If 7PM-7AM, please contact night-coverage [email or web address] Password [facility] 05/15/2016, 2:35 PM

## 2016-05-16 DIAGNOSIS — E1143 Type 2 diabetes mellitus with diabetic autonomic (poly)neuropathy: Secondary | ICD-10-CM

## 2016-05-16 DIAGNOSIS — R338 Other retention of urine: Secondary | ICD-10-CM

## 2016-05-16 LAB — BASIC METABOLIC PANEL
ANION GAP: 7 (ref 5–15)
BUN: 5 mg/dL — ABNORMAL LOW (ref 6–20)
CALCIUM: 8.5 mg/dL — AB (ref 8.9–10.3)
CO2: 20 mmol/L — AB (ref 22–32)
CREATININE: 0.44 mg/dL (ref 0.44–1.00)
Chloride: 109 mmol/L (ref 101–111)
GFR calc non Af Amer: >60 mL/min (ref >60)
GLUCOSE: 228 mg/dL — AB (ref 65–99)
Potassium: 4.1 mmol/L (ref 3.5–5.1)
Sodium: 136 mmol/L (ref 135–145)

## 2016-05-16 LAB — GLUCOSE, CAPILLARY
Glucose-Capillary: 215 mg/dL — ABNORMAL HIGH (ref 65–99)
Glucose-Capillary: 166 mg/dL — ABNORMAL HIGH (ref 65–99)

## 2016-05-16 MED ORDER — BASAGLAR KWIKPEN 100 UNIT/ML ~~LOC~~ SOPN: 28.0000 [IU] | PEN_INJECTOR | Freq: Every day | SUBCUTANEOUS | 0 refills | Status: DC

## 2016-05-16 MED ORDER — INSULIN GLARGINE 100 UNIT/ML ~~LOC~~ SOLN
28.0000 [IU] | Freq: Every day | SUBCUTANEOUS | Status: DC
  Administered 2016-05-16: 28 [IU] via SUBCUTANEOUS
  Filled 2016-05-16: qty 0.28

## 2016-05-16 MED ORDER — INSULIN ASPART 100 UNIT/ML ~~LOC~~ SOLN
5.0000 [IU] | Freq: Three times a day (TID) | SUBCUTANEOUS | Status: DC
  Administered 2016-05-16: 5 [IU] via SUBCUTANEOUS

## 2016-05-16 MED ORDER — KETOROLAC TROMETHAMINE 10 MG PO TABS: 10.0000 mg | ORAL_TABLET | Freq: Four times a day (QID) | ORAL | 0 refills | Status: DC | PRN

## 2016-05-16 NOTE — Discharge Summary (Signed)
Physician Discharge Summary  [name] NAT:[id number] DOB: [birth year] DOA: 05/13/2016  PCP: Stefanie Aly, FNP  Admit date: 05/13/2016 Discharge date: 05/16/2016  Admitted From: Home Disposition:  Home  Recommendations for Outpatient Follow-up:  1. Follow up with PCP in 1-2 weeks   Discharge Condition:Improved CODE STATUS:Full Diet recommendation: Diabetic   Brief/Interim Summary: 26 y.o.femalewith medical history significant of diabetes mellitus type 1,recurrent DKA recent pancreatitis, liver adenomas, anxiety, hepatic steatosis, asthma   Presented with nausea vomiting and abdominal pain she was just discharged from hospital 3 days ago for DKA on arrival heart rate 140 Reports abdominal pain has been getting worse over past few days has but sugar has been in 200s pain is dull and constant no associated diarrhea and vomiting symptoms makes pain worse Initially hospitalist was called for patient but thought that this is not consistent with DKA recommended IV fluids despite aggressive fluid resuscitation patient's bicarbonate has went down from 22-17 she feels better regarding nausea and vomiting that continue to be tachycardic at which point patient was admitted for further management. deneis any fever no chest pain no shortness of breath. Denies medication noncompliance reports she just started to feel worse soon after she was discharged to home had decreased PO intake and increased abdominal discomfort.  . DKA, type 1, recurrent ([place]) - Patient has been continued on glucosestabalizer, aggressive IVF - GAP has closed, acidosis improved. Patient transitioned off insulin gtt and onto subQ insulin - Continue on SSI coverage -Lantus increased from home dose of 25 to 28 units - Glucose at time of discharge of 166  . Chronic diastolic heart failure (HCC)  - presented dehydrated - held further fluids for now as pt is no longer NPO  . Diabetic gastroparesis ([place]) -  Will continue on scheduled Reglan parietal avoid opioids - presently stable  . Asthma - Presentlystable - Will continue with PRN nebs continue a bronchodilators as needed - No active wheezing at present  . Hematuria  - suspect secondary to bladder chronic distention and irritation - Urine culture pending - Recheck CBC in AM  . Intractable vomiting with nausea likely secondary to gastroparesis secondary to diabetes and opioids - Cont to avoid opioid medications - Plan to continue with Reglan as tolerated  . Depression - Appears stable at present - Plan to continue home medications  . GERD (gastroesophageal reflux disease) - Presently stable - Continue with home medications as tolerated  . Acute urinary retention - foley cath placed at time of admit - remained stable  Discharge Diagnoses:  Active Problems:   Asthma   DKA, type 1 ([place])   Diabetic ketoacidosis without coma associated with type 1 diabetes mellitus ([place])   Chronic diastolic heart failure (HCC)   Dehydration   Diabetic gastroparesis (HCC)   Intractable vomiting with nausea   GERD (gastroesophageal reflux disease)   Acute urinary retention   Depression   Hematuria    Discharge Instructions   Allergies as of 05/16/2016      Reactions   Peanut-containing Drug Products Swelling, Other (See Comments)   Reaction:  Swelling of mouth and lips    Food Swelling, Other (See Comments)   Pt is allergic to strawberries.   Reaction:  Swelling of mouth and lips    Ultram [tramadol] Itching      Medication List    STOP taking these medications   polyethylene glycol packet Commonly known as:  MIRALAX / GLYCOLAX     TAKE these medications   albuterol 108 (90  Base) MCG/ACT inhaler Commonly known as:  PROVENTIL HFA;VENTOLIN HFA Inhale 2 puffs into the lungs every 6 (six) hours as needed for wheezing or shortness of breath.   albuterol (2.5 MG/3ML) 0.083% nebulizer solution Commonly known as:   PROVENTIL Take 2.5 mg by nebulization every 6 (six) hours as needed for wheezing or shortness of breath.   BASAGLAR KWIKPEN 100 UNIT/ML Sopn Inject 0.28 mLs (28 Units total) into the skin at bedtime. What changed:  how much to take   budesonide-formoterol 160-4.5 MCG/ACT inhaler Commonly known as:  SYMBICORT Inhale 2 puffs into the lungs 2 (two) times daily.   escitalopram 10 MG tablet Commonly known as:  LEXAPRO Take 0.5 tablets (5 mg total) by mouth daily.   famotidine 20 MG tablet Commonly known as:  PEPCID Take 1 tablet (20 mg total) by mouth at bedtime.   ketorolac 10 MG tablet Commonly known as:  TORADOL Take 1 tablet (10 mg total) by mouth every 6 (six) hours as needed.   metoCLOPramide 10 MG tablet Commonly known as:  REGLAN Take 1 tablet (10 mg total) by mouth 3 (three) times daily before meals.   metoprolol tartrate 25 MG tablet Commonly known as:  LOPRESSOR Take 0.5 tablets (12.5 mg total) by mouth 2 (two) times daily.   NOVOLOG FLEXPEN 100 UNIT/ML FlexPen Generic drug:  insulin aspart Inject into the skin See admin instructions. Pt uses 1 unit for every 10 grams of carbs with meals and snacks for BS greater than 50.   pantoprazole 40 MG tablet Commonly known as:  PROTONIX Take 1 tablet (40 mg total) by mouth 2 (two) times daily.   promethazine 25 MG tablet Commonly known as:  PHENERGAN Take 1 tablet (25 mg total) by mouth every 6 (six) hours as needed for nausea or vomiting.      Follow-up Information    Schedule an appointment as soon as possible for a visit with [name], [place].   Specialty:  Nurse Practitioner Contact information: [place] [address] [phone]        [facility]EM[facility]ENCY [facility] Follow up.   Specialty:  Emergency Medicine Why:  If symptoms worsen Contact information: [place] [phone] [place] [phone]          Allergies  Allergen Reactions  . Peanut-Containing Drug Products Swelling and Other (See Comments)    Reaction:  Swelling of mouth and lips   . 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   Reaction:  Swelling of mouth and lips   . Ultram [Tramadol] Itching     Procedures/Studies: Dg Chest 2 View  Result Date: 05/04/2016 CLINICAL DATA:  Acute onset of nausea, vomiting and generalized abdominal tenderness. Dark emesis. Severe lethargy. Initial encounter. EXAM: CHEST  2 VIEW COMPARISON:  Chest radiograph from 02/29/2016 FINDINGS: The lungs are well-aerated. Pulmonary vascularity is at the upper limits of normal. There is no evidence of focal opacification, pleural effusion or pneumothorax. The heart is normal in size; the mediastinal contour is within normal limits. No acute osseous abnormalities are seen. IMPRESSION: No acute cardiopulmonary process seen. Electronically Signed   By: Garald Balding M.D.   On: 05/04/2016 23:32   Portable Chest X-ray (1 View)  Result Date: 05/14/2016 CLINICAL DATA:  DKA.  Shortness of breath . EXAM: PORTABLE CHEST 1 VIEW COMPARISON:  05/04/2016 . FINDINGS: Mediastinum and hilar structures are normal. Heart size stable. Lungs are  clear. No pleural effusion or pneumothorax . IMPRESSION: No acute abnormality. Electronically Signed   By: Marcello Moores  Register   On: 05/14/2016 07:20   Dg Abd Portable 1v  Result Date: 05/09/2016 CLINICAL DATA:  Abdominal pain for 5 days. EXAM: PORTABLE ABDOMEN - 1 VIEW COMPARISON:  02/05/2016 FINDINGS: Gas is present throughout nondilated colon. Gas is also present in grossly nondistended stomach, and there is a small amount of small bowel gas. No dilated loops of bowel are seen to suggest obstruction. There is no evidence of intraperitoneal free air on this supine study. Visualized lung bases are clear. No acute osseous abnormality is seen. Right upper quadrant abdominal surgical clips are noted. IMPRESSION:  Nonobstructed bowel gas pattern. Electronically Signed   By: Logan Bores M.D.   On: 05/09/2016 09:10    Subjective: Eager to go home  Discharge Exam: Vitals:   05/16/16 0430 05/16/16 1326  BP: 120/79 121/83  Pulse: (!) 105 (!) 104  Resp: 18 18  Temp: 98.2 F (36.8 C) 98.4 F (36.9 C)   Vitals:   05/15/16 1620 05/15/16 2028 05/16/16 0430 05/16/16 1326  BP: 128/84 118/83 120/79 121/83  Pulse:  (!) 109 (!) 105 (!) 104  Resp: 16 18 18 18   Temp: 97.9 F (36.6 C) 98 F (36.7 C) 98.2 F (36.8 C) 98.4 F (36.9 C)  TempSrc: Oral Oral Oral Oral  SpO2: 100% 94% 100% 100%  Weight:      Height:        General: Pt is alert, awake, not in acute distress Cardiovascular: RRR, S1/S2 +, no rubs, no gallops Respiratory: CTA bilaterally, no wheezing, no rhonchi Abdominal: Soft, NT, ND, bowel sounds + Extremities: no edema, no cyanosis   The results of significant diagnostics from this hospitalization (including imaging, microbiology, ancillary and laboratory) are listed below for reference.     Microbiology: Recent Results (from the past 240 hour(s))  Urine culture     Status: Abnormal   Collection Time: 05/14/16 12:45 AM  Result Value Ref Range Status   Specimen Description URINE, CATHETERIZED  Final   Special Requests NONE  Final   Culture >=100,000 COLONIES/mL YEAST (A)  Final   Report Status 05/15/2016 FINAL  Final     Labs: BNP (last 3 results)  Recent Labs  07/11/15 0353  BNP 6.9   Basic Metabolic Panel:  Recent Labs Lab 05/14/16 0238  05/14/16 1400 05/14/16 1857 05/15/16 0041 05/15/16 0409 05/16/16 0644  NA 132*  < > 130* 131* 131* 136 136  K 4.7  < > 3.9 3.5 3.0* 3.1* 4.1  CL 105  < > 105 106 106 109 109  CO2 7*  < > 16* 17* 18* 20* 20*  GLUCOSE 301*  < > 166* 162* 167* 151* 228*  BUN 18  < > 10 9 7  5* 5*  CREATININE 1.13*  < > 0.69 0.56 0.49 0.46 0.44  CALCIUM 8.5*  < > 8.5* 8.6* 8.5* 8.5* 8.5*  MG 1.7  --   --   --   --   --   --   PHOS 2.5  --    --   --   --   --   --   < > = values in this interval not displayed. Liver Function Tests:  Recent Labs Lab 05/13/16 1408 05/13/16 1752  AST 18 15  ALT 13* 11*  ALKPHOS 136* 105  BILITOT 1.0 1.2  PROT 9.5* 7.7  ALBUMIN 4.8 3.8  Recent Labs Lab 05/13/16 1408  LIPASE 11   No results for input(s): AMMONIA in the last 168 hours. CBC:  Recent Labs Lab 05/13/16 1408 05/15/16 0409  WBC 14.8* 5.6  NEUTROABS 10.3*  --   HGB 15.9* 12.0  HCT 46.2* 34.4*  MCV 88.5 86.2  PLT 547* 366   Cardiac Enzymes:  Recent Labs Lab 05/14/16 0240 05/14/16 0805 05/14/16 1400  TROPONINI <0.03 <0.03 <0.03   BNP: Invalid input(s): POCBNP CBG:  Recent Labs Lab 05/15/16 1120 05/15/16 1656 05/15/16 2038 05/16/16 0742 05/16/16 1139  GLUCAP 203* 271* 238* 215* 166*   D-Dimer No results for input(s): DDIMER in the last 72 hours. Hgb A1c No results for input(s): HGBA1C in the last 72 hours. Lipid Profile No results for input(s): CHOL, HDL, LDLCALC, TRIG, CHOLHDL, LDLDIRECT in the last 72 hours. Thyroid function studies No results for input(s): TSH, T4TOTAL, T3FREE, THYROIDAB in the last 72 hours.  Invalid input(s): FREET3 Anemia work up No results for input(s): VITAMINB12, FOLATE, FERRITIN, TIBC, IRON, RETICCTPCT in the last 72 hours. Urinalysis    Component Value Date/Time   COLORURINE YELLOW 05/13/2016 1408   APPEARANCEUR TURBID (A) 05/13/2016 1408   LABSPEC 1.014 05/13/2016 1408   PHURINE 5.0 05/13/2016 1408   GLUCOSEU >=500 (A) 05/13/2016 1408   GLUCOSEU >=1000 11/07/2012 1205   HGBUR SMALL (A) 05/13/2016 1408   BILIRUBINUR NEGATIVE 05/13/2016 1408   KETONESUR 80 (A) 05/13/2016 1408   PROTEINUR 100 (A) 05/13/2016 1408   UROBILINOGEN 0.2 11/24/2014 1045   NITRITE NEGATIVE 05/13/2016 1408   LEUKOCYTESUR LARGE (A) 05/13/2016 1408   Sepsis Labs Invalid input(s): PROCALCITONIN,  WBC,  LACTICIDVEN Microbiology Recent Results (from the past 240 hour(s))  Urine  culture     Status: Abnormal   Collection Time: 05/14/16 12:45 AM  Result Value Ref Range Status   Specimen Description URINE, CATHETERIZED  Final   Special Requests NONE  Final   Culture >=100,000 COLONIES/mL YEAST (A)  Final   Report Status 05/15/2016 FINAL  Final     SIGNED:   Donne Hazel, MD  [facility] 05/16/2016, 1:28 PM  If 7PM-7AM, please contact night-coverage [email or web address] Password [email or web address]

## 2016-05-16 NOTE — Progress Notes (Signed)
05/16/16  1400  Reviewed discharge instructions with patient. Patient verbalized understanding of discharge instructions. Copy of discharge instructions and prescriptions given to patient. Offered to call for resources for patient before discharge. Patient states she does not need to speak with case management, social worker, or diabetes coordinator that she knows what she needs to do regarding her diabetes.

## 2016-05-31 ENCOUNTER — Inpatient Hospital Stay (HOSPITAL_COMMUNITY)
Admission: EM | Admit: 2016-05-31 | Discharge: 2016-06-06 | DRG: 074 | Disposition: A | Discharge status: Home / Self Care | Payer: Medicaid Other | Attending: Family Medicine | Admitting: Family Medicine

## 2016-05-31 ENCOUNTER — Encounter (HOSPITAL_COMMUNITY): Payer: Self-pay | Admitting: Emergency Medicine

## 2016-05-31 DIAGNOSIS — F419 Anxiety disorder, unspecified: Secondary | ICD-10-CM | POA: Diagnosis present

## 2016-05-31 DIAGNOSIS — Z91010 Allergy to peanuts: Secondary | ICD-10-CM

## 2016-05-31 DIAGNOSIS — F1120 Opioid dependence, uncomplicated: Secondary | ICD-10-CM | POA: Diagnosis present

## 2016-05-31 DIAGNOSIS — E1065 Type 1 diabetes mellitus with hyperglycemia: Secondary | ICD-10-CM | POA: Diagnosis not present

## 2016-05-31 DIAGNOSIS — F1290 Cannabis use, unspecified, uncomplicated: Secondary | ICD-10-CM | POA: Diagnosis present

## 2016-05-31 DIAGNOSIS — Y92009 Unspecified place in unspecified non-institutional (private) residence as the place of occurrence of the external cause: Secondary | ICD-10-CM

## 2016-05-31 DIAGNOSIS — Z885 Allergy status to narcotic agent status: Secondary | ICD-10-CM

## 2016-05-31 DIAGNOSIS — Z8249 Family history of ischemic heart disease and other diseases of the circulatory system: Secondary | ICD-10-CM

## 2016-05-31 DIAGNOSIS — Z4659 Encounter for fitting and adjustment of other gastrointestinal appliance and device: Secondary | ICD-10-CM

## 2016-05-31 DIAGNOSIS — R112 Nausea with vomiting, unspecified: Secondary | ICD-10-CM

## 2016-05-31 DIAGNOSIS — E860 Dehydration: Secondary | ICD-10-CM | POA: Diagnosis present

## 2016-05-31 DIAGNOSIS — K760 Fatty (change of) liver, not elsewhere classified: Secondary | ICD-10-CM | POA: Diagnosis present

## 2016-05-31 DIAGNOSIS — Z87440 Personal history of urinary (tract) infections: Secondary | ICD-10-CM

## 2016-05-31 DIAGNOSIS — E1010 Type 1 diabetes mellitus with ketoacidosis without coma: Secondary | ICD-10-CM | POA: Diagnosis present

## 2016-05-31 DIAGNOSIS — Z9114 Patient's other noncompliance with medication regimen: Secondary | ICD-10-CM

## 2016-05-31 DIAGNOSIS — F319 Bipolar disorder, unspecified: Secondary | ICD-10-CM | POA: Diagnosis present

## 2016-05-31 DIAGNOSIS — I5032 Chronic diastolic (congestive) heart failure: Secondary | ICD-10-CM | POA: Diagnosis present

## 2016-05-31 DIAGNOSIS — E876 Hypokalemia: Secondary | ICD-10-CM | POA: Diagnosis present

## 2016-05-31 DIAGNOSIS — M1990 Unspecified osteoarthritis, unspecified site: Secondary | ICD-10-CM | POA: Diagnosis present

## 2016-05-31 DIAGNOSIS — R0682 Tachypnea, not elsewhere classified: Secondary | ICD-10-CM

## 2016-05-31 DIAGNOSIS — K219 Gastro-esophageal reflux disease without esophagitis: Secondary | ICD-10-CM | POA: Diagnosis present

## 2016-05-31 DIAGNOSIS — Z79899 Other long term (current) drug therapy: Secondary | ICD-10-CM

## 2016-05-31 DIAGNOSIS — T447X6A Underdosing of beta-adrenoreceptor antagonists, initial encounter: Secondary | ICD-10-CM | POA: Diagnosis present

## 2016-05-31 DIAGNOSIS — R1084 Generalized abdominal pain: Secondary | ICD-10-CM

## 2016-05-31 DIAGNOSIS — Z833 Family history of diabetes mellitus: Secondary | ICD-10-CM

## 2016-05-31 DIAGNOSIS — K3184 Gastroparesis: Secondary | ICD-10-CM | POA: Diagnosis present

## 2016-05-31 DIAGNOSIS — R011 Cardiac murmur, unspecified: Secondary | ICD-10-CM | POA: Diagnosis present

## 2016-05-31 DIAGNOSIS — I110 Hypertensive heart disease with heart failure: Secondary | ICD-10-CM | POA: Diagnosis present

## 2016-05-31 DIAGNOSIS — R339 Retention of urine, unspecified: Secondary | ICD-10-CM | POA: Diagnosis present

## 2016-05-31 DIAGNOSIS — R160 Hepatomegaly, not elsewhere classified: Secondary | ICD-10-CM | POA: Diagnosis present

## 2016-05-31 DIAGNOSIS — Z794 Long term (current) use of insulin: Secondary | ICD-10-CM

## 2016-05-31 DIAGNOSIS — Z7951 Long term (current) use of inhaled steroids: Secondary | ICD-10-CM

## 2016-05-31 DIAGNOSIS — J45909 Unspecified asthma, uncomplicated: Secondary | ICD-10-CM | POA: Diagnosis present

## 2016-05-31 DIAGNOSIS — E1043 Type 1 diabetes mellitus with diabetic autonomic (poly)neuropathy: Principal | ICD-10-CM | POA: Diagnosis present

## 2016-05-31 DIAGNOSIS — Z91018 Allergy to other foods: Secondary | ICD-10-CM

## 2016-05-31 DIAGNOSIS — Z9049 Acquired absence of other specified parts of digestive tract: Secondary | ICD-10-CM

## 2016-05-31 LAB — COMPREHENSIVE METABOLIC PANEL
ALT: 11 U/L — AB (ref 14–54)
ANION GAP: 12 (ref 5–15)
AST: 19 U/L (ref 15–41)
Albumin: 4.5 g/dL (ref 3.5–5.0)
Alkaline Phosphatase: 102 U/L (ref 38–126)
BUN: 20 mg/dL (ref 6–20)
CALCIUM: 9.7 mg/dL (ref 8.9–10.3)
CHLORIDE: 102 mmol/L (ref 101–111)
CO2: 25 mmol/L (ref 22–32)
CREATININE: 0.66 mg/dL (ref 0.44–1.00)
Glucose, Bld: 152 mg/dL — ABNORMAL HIGH (ref 65–99)
Potassium: 3.6 mmol/L (ref 3.5–5.1)
Sodium: 139 mmol/L (ref 135–145)
Total Bilirubin: 0.7 mg/dL (ref 0.3–1.2)
Total Protein: 8.2 g/dL — ABNORMAL HIGH (ref 6.5–8.1)

## 2016-05-31 LAB — CBC
HCT: 40.3 % (ref 36.0–46.0)
HEMOGLOBIN: 13.3 g/dL (ref 12.0–15.0)
MCH: 29.5 pg (ref 26.0–34.0)
MCHC: 33 g/dL (ref 30.0–36.0)
MCV: 89.4 fL (ref 78.0–100.0)
PLATELETS: 475 10*3/uL — AB (ref 150–400)
RBC: 4.51 MIL/uL (ref 3.87–5.11)
RDW: 13.4 % (ref 11.5–15.5)
WBC: 10 10*3/uL (ref 4.0–10.5)

## 2016-05-31 LAB — LIPASE, BLOOD: LIPASE: 15 U/L (ref 11–51)

## 2016-05-31 LAB — I-STAT BETA HCG BLOOD, ED (MC, WL, AP ONLY)

## 2016-05-31 LAB — CBG MONITORING, ED: GLUCOSE-CAPILLARY: 228 mg/dL — AB (ref 65–99)

## 2016-05-31 MED ORDER — PROMETHAZINE HCL 25 MG/ML IJ SOLN
12.5000 mg | Freq: Once | INTRAMUSCULAR | Status: AC
  Administered 2016-05-31: 12.5 mg via INTRAVENOUS
  Filled 2016-05-31: qty 1

## 2016-05-31 MED ORDER — KETOROLAC TROMETHAMINE 15 MG/ML IJ SOLN
15.0000 mg | Freq: Once | INTRAMUSCULAR | Status: AC
  Administered 2016-06-01: 15 mg via INTRAVENOUS
  Filled 2016-05-31: qty 1

## 2016-05-31 MED ORDER — ACETAMINOPHEN 650 MG RE SUPP: 650.0000 mg | Freq: Four times a day (QID) | RECTAL | Status: DC | PRN

## 2016-05-31 MED ORDER — ONDANSETRON HCL 4 MG/2ML IJ SOLN
4.0000 mg | Freq: Four times a day (QID) | INTRAMUSCULAR | Status: DC | PRN
  Administered 2016-06-01 – 2016-06-06 (×9): 4 mg via INTRAVENOUS
  Filled 2016-05-31 (×9): qty 2

## 2016-05-31 MED ORDER — INSULIN GLARGINE 100 UNIT/ML ~~LOC~~ SOLN
8.0000 [IU] | Freq: Every day | SUBCUTANEOUS | Status: DC
  Administered 2016-06-01 (×2): 8 [IU] via SUBCUTANEOUS
  Filled 2016-05-31 (×2): qty 0.08

## 2016-05-31 MED ORDER — ACETAMINOPHEN 325 MG PO TABS
650.0000 mg | ORAL_TABLET | Freq: Four times a day (QID) | ORAL | Status: DC | PRN
  Filled 2016-05-31: qty 2

## 2016-05-31 MED ORDER — MOMETASONE FURO-FORMOTEROL FUM 200-5 MCG/ACT IN AERO
2.0000 | INHALATION_SPRAY | Freq: Two times a day (BID) | RESPIRATORY_TRACT | Status: DC
  Filled 2016-05-31: qty 8.8

## 2016-05-31 MED ORDER — CAPSAICIN 0.025 % EX CREA
TOPICAL_CREAM | Freq: Once | CUTANEOUS | Status: DC
  Filled 2016-05-31: qty 60

## 2016-05-31 MED ORDER — ONDANSETRON 4 MG PO TBDP: 4.0000 mg | ORAL_TABLET | Freq: Once | ORAL | Status: DC | PRN

## 2016-05-31 MED ORDER — INSULIN ASPART 100 UNIT/ML ~~LOC~~ SOLN
0.0000 [IU] | Freq: Three times a day (TID) | SUBCUTANEOUS | Status: DC
Start: 2016-06-01 — End: 2016-06-03
  Administered 2016-06-01: 1 [IU] via SUBCUTANEOUS
  Administered 2016-06-01: 5 [IU] via SUBCUTANEOUS
  Administered 2016-06-01: 2 [IU] via SUBCUTANEOUS
  Administered 2016-06-02: 5 [IU] via SUBCUTANEOUS
  Administered 2016-06-02 (×2): 3 [IU] via SUBCUTANEOUS
  Administered 2016-06-03: 7 [IU] via SUBCUTANEOUS
  Administered 2016-06-03: 3 [IU] via SUBCUTANEOUS
  Administered 2016-06-03: 9 [IU] via SUBCUTANEOUS

## 2016-05-31 MED ORDER — HALOPERIDOL LACTATE 5 MG/ML IJ SOLN
5.0000 mg | Freq: Once | INTRAMUSCULAR | Status: AC
  Administered 2016-05-31: 5 mg via INTRAVENOUS
  Filled 2016-05-31: qty 1

## 2016-05-31 MED ORDER — PANTOPRAZOLE SODIUM 40 MG IV SOLR
40.0000 mg | Freq: Every day | INTRAVENOUS | Status: DC
  Administered 2016-06-01: 40 mg via INTRAVENOUS
  Filled 2016-05-31: qty 40

## 2016-05-31 MED ORDER — SODIUM CHLORIDE 0.9 % IV SOLN
INTRAVENOUS | Status: AC
  Administered 2016-06-01 (×2): via INTRAVENOUS

## 2016-05-31 MED ORDER — CAPSAICIN 0.025 % EX CREA
TOPICAL_CREAM | Freq: Once | CUTANEOUS | Status: AC
  Administered 2016-05-31: 20:00:00 via TOPICAL
  Filled 2016-05-31: qty 60

## 2016-05-31 MED ORDER — METOCLOPRAMIDE HCL 5 MG/ML IJ SOLN
5.0000 mg | Freq: Four times a day (QID) | INTRAMUSCULAR | Status: AC
  Administered 2016-06-01 (×2): 5 mg via INTRAVENOUS
  Filled 2016-05-31 (×2): qty 2

## 2016-05-31 MED ORDER — SODIUM CHLORIDE 0.9 % IV BOLUS (SEPSIS)
1000.0000 mL | Freq: Once | INTRAVENOUS | Status: AC
  Administered 2016-05-31: 1000 mL via INTRAVENOUS

## 2016-05-31 MED ORDER — ONDANSETRON HCL 4 MG PO TABS: 4.0000 mg | ORAL_TABLET | Freq: Four times a day (QID) | ORAL | Status: DC | PRN

## 2016-05-31 MED ORDER — KETOROLAC TROMETHAMINE 30 MG/ML IJ SOLN
30.0000 mg | Freq: Once | INTRAMUSCULAR | Status: AC
  Administered 2016-05-31: 30 mg via INTRAVENOUS
  Filled 2016-05-31: qty 1

## 2016-05-31 NOTE — H&P (Signed)
History and Physical    Alivya Wegman QHU:[id number] DOB: 1990/10/24 DOA: 05/31/2016  PCP: Vicenta Aly, FNP  Patient coming from: Home.  Chief Complaint: Nausea vomiting.  HPI: Rion Schnitzer is a 26 y.o. female with history of diabetes mellitus type 1 with history of gastroparesis presents to the ER with complaints of nausea vomiting. Patient also has been having crampy abdominal discomfort. Patient's symptoms started this morning. Denies any recent travel or sick contacts. Denies any diarrhea. Has had multiple episodes of vomiting.   ED Course: In the ER despite giving antiemetics and fluids patient was still having nausea and vomiting. Patient is being admitted for further observation and management. On exam patient's abdomen appears benign and patient's symptoms are more likely from gastroparesis.  Review of Systems: As per HPI, rest all negative.   Past Medical History:  Diagnosis Date  . Anxiety   . Arthritis   . Asthma   . Diabetes mellitus 2007   IDDM.  poorly controlled, multiple admits with DKA  . Gallstones   . Gastroparesis   . Heart murmur   . Hepatic steatosis 11/26/2014   and hepatomegaly  . Hypertension    NOT CURRENTLY ON ANY BP MED  . Liver mass 11/26/2014  . Pancreatitis, acute 11/26/2014    Past Surgical History:  Procedure Laterality Date  . CHOLECYSTECTOMY N/A 02/11/2015   Procedure: LAPAROSCOPIC CHOLECYSTECTOMY WITH INTRAOPERATIVE CHOLANGIOGRAM;  Surgeon: Greer Pickerel, MD;  Location: [facility] ORS;  Service: General;  Laterality: N/A;  . ESOPHAGOGASTRODUODENOSCOPY (EGD) WITH PROPOFOL Left 09/20/2014   Procedure: ESOPHAGOGASTRODUODENOSCOPY (EGD) WITH PROPOFOL;  Surgeon: Arta Silence, MD;  Location[facility];  Service: Endoscopy;  Laterality: Left;  . WISDOM TOOTH EXTRACTION       reports that she has never smoked. She has never used smokeless tobacco. She reports that she uses drugs, including Marijuana. She reports that she does not drink  alcohol.  Allergies  Allergen Reactions  . Peanut-Containing Drug Products Swelling and Other (See Comments)    Reaction:  Swelling of mouth and lips   . 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   Reaction:  Swelling of mouth and lips   . Ultram [Tramadol] Itching    Family History  Problem Relation Age of Onset  . Heart disease Maternal Grandmother   . Heart disease Maternal Grandfather   . Diabetes Mother   . Hyperlipidemia Mother   . Hypertension Father   . Heart disease Father   . Hypertension Paternal Grandmother   . Cancer Paternal Grandfather     Prior to Admission medications   Medication Sig Start Date End Date Taking? Authorizing Provider  albuterol (PROVENTIL HFA;VENTOLIN HFA) 108 (90 BASE) MCG/ACT inhaler Inhale 2 puffs into the lungs every 6 (six) hours as needed for wheezing or shortness of breath.    Yes [provider]  albuterol (PROVENTIL) (2.5 MG/3ML) 0.083% nebulizer solution Take 2.5 mg by nebulization every 6 (six) hours as needed for wheezing or shortness of breath.    Yes [provider]  budesonide-formoterol (SYMBICORT) 160-4.5 MCG/ACT inhaler Inhale 2 puffs into the lungs 2 (two) times daily.    Yes [provider]  escitalopram (LEXAPRO) 10 MG tablet Take 0.5 tablets (5 mg total) by mouth daily. 01/19/16  Yes Barton Dubois, MD  famotidine (PEPCID) 20 MG tablet Take 1 tablet (20 mg total) by mouth at bedtime. 02/29/16  Yes Antonietta Breach, PA-C  insulin aspart (NOVOLOG FLEXPEN) 100 UNIT/ML FlexPen  Inject into the skin See admin instructions. Pt uses 1 unit for every 10 grams of carbs with meals and snacks for BS greater than 50.   Yes [provider]  Insulin Glargine (BASAGLAR KWIKPEN) 100 UNIT/ML SOPN Inject 0.28 mLs (28 Units total) into the skin at bedtime. 05/16/16  Yes Donne Hazel, MD  metoCLOPramide (REGLAN) 10 MG tablet Take 1 tablet (10 mg total) by mouth 3 (three) times daily  before meals. 02/29/16  Yes Antonietta Breach, PA-C  metoprolol tartrate (LOPRESSOR) 25 MG tablet Take 0.5 tablets (12.5 mg total) by mouth 2 (two) times daily. 05/10/16  Yes Mariel Aloe, MD  pantoprazole (PROTONIX) 40 MG tablet Take 1 tablet (40 mg total) by mouth 2 (two) times daily. 02/25/16  Yes Regalado, Belkys A, MD  promethazine (PHENERGAN) 25 MG tablet Take 1 tablet (25 mg total) by mouth every 6 (six) hours as needed for nausea or vomiting. 05/13/16  Yes Tegeler, Gwenyth Allegra, MD  Vitamin D, Ergocalciferol, (DRISDOL) 50000 units CAPS capsule Take 50,000 Units by mouth once a week. 05/26/16  Yes [provider]  ketorolac (TORADOL) 10 MG tablet Take 1 tablet (10 mg total) by mouth every 6 (six) hours as needed. Patient not taking: Reported on 05/31/2016 05/16/16   Donne Hazel, MD    Physical Exam: Vitals:   05/31/16 1750 05/31/16 2042 05/31/16 2230 05/31/16 2322  BP: (!) 133/92 (!) 144/97 135/88 138/73  Pulse: 88 78 (!) 124 (!) 123  Resp: 18 18  16   Temp:    98.6 F (37 C)  TempSrc:    Oral  SpO2: 100% 100% 98% 100%  Weight:    55.4 kg (122 lb 2.2 oz)  Height:    5\' 4"  (1.626 m)      Constitutional: Moderately built and nourished. Vitals:   05/31/16 1750 05/31/16 2042 05/31/16 2230 05/31/16 2322  BP: (!) 133/92 (!) 144/97 135/88 138/73  Pulse: 88 78 (!) 124 (!) 123  Resp: 18 18  16   Temp:    98.6 F (37 C)  TempSrc:    Oral  SpO2: 100% 100% 98% 100%  Weight:    55.4 kg (122 lb 2.2 oz)  Height:    5\' 4"  (1.626 m)   Eyes: Anicteric. No pallor. ENMT: No discharge from the ears eyes nose and mouth. Neck: No mass felt. No neck rigidity. Respiratory: No rhonchi or crepitations. Cardiovascular: S1 and S2 heard no murmurs appreciated. Abdomen: Soft nontender bowel sounds present. No guarding or rigidity. Musculoskeletal: No edema. No joint effusion. Skin: No rash. Skin appears warm. Neurologic: Alert awake oriented to time place and person. Moves all  extremities. Psychiatric: Appears normal. Normal affect.   Labs on Admission: I have personally reviewed following labs and imaging studies  CBC:  Recent Labs Lab 05/31/16 1544  WBC 10.0  HGB 13.3  HCT 40.3  MCV 89.4  PLT 062*   Basic Metabolic Panel:  Recent Labs Lab 05/31/16 1544  NA 139  K 3.6  CL 102  CO2 25  GLUCOSE 152*  BUN 20  CREATININE 0.66  CALCIUM 9.7   GFR: Estimated Creatinine Clearance: 92 mL/min (by C-G formula based on SCr of 0.66 mg/dL). Liver Function Tests:  Recent Labs Lab 05/31/16 1544  AST 19  ALT 11*  ALKPHOS 102  BILITOT 0.7  PROT 8.2*  ALBUMIN 4.5    Recent Labs Lab 05/31/16 1544  LIPASE 15   No results for input(s): AMMONIA in the last 168  hours. Coagulation Profile: No results for input(s): INR, PROTIME in the last 168 hours. Cardiac Enzymes: No results for input(s): CKTOTAL, CKMB, CKMBINDEX, TROPONINI in the last 168 hours. BNP (last 3 results) No results for input(s): PROBNP in the last 8760 hours. HbA1C: No results for input(s): HGBA1C in the last 72 hours. CBG:  Recent Labs Lab 05/31/16 2000  GLUCAP 228*   Lipid Profile: No results for input(s): CHOL, HDL, LDLCALC, TRIG, CHOLHDL, LDLDIRECT in the last 72 hours. Thyroid Function Tests: No results for input(s): TSH, T4TOTAL, FREET4, T3FREE, THYROIDAB in the last 72 hours. Anemia Panel: No results for input(s): VITAMINB12, FOLATE, FERRITIN, TIBC, IRON, RETICCTPCT in the last 72 hours. Urine analysis:    Component Value Date/Time   COLORURINE YELLOW 05/13/2016 1408   APPEARANCEUR TURBID (A) 05/13/2016 1408   LABSPEC 1.014 05/13/2016 1408   PHURINE 5.0 05/13/2016 1408   GLUCOSEU >=500 (A) 05/13/2016 1408   GLUCOSEU >=1000 11/07/2012 1205   HGBUR SMALL (A) 05/13/2016 1408   BILIRUBINUR NEGATIVE 05/13/2016 1408   KETONESUR 80 (A) 05/13/2016 1408   PROTEINUR 100 (A) 05/13/2016 1408   UROBILINOGEN 0.2 11/24/2014 1045   NITRITE NEGATIVE 05/13/2016 1408    LEUKOCYTESUR LARGE (A) 05/13/2016 1408   Sepsis Labs: @LABRCNTIP (procalcitonin:4,lacticidven:4) )No results found for this or any previous visit (from the past 240 hour(s)).   Radiological Exams on Admission: No results found.   Assessment/Plan Principal Problem:   Nausea & vomiting Active Problems:   Uncontrolled type 1 diabetes mellitus with hyperglycemia ([place])    1. Intractable nausea vomiting likely from [place] gastroparesis - abdomen appears benign on exam. LFTs and lipase were normal. For now I have placed patient on Reglan 5 mg IV every 62 doses and further doses to be decided based on response. I have also ordered 1 dose of Toradol for pain. Will avoid narcotics. Continue with hydration. Clear liquid diet. 2. Diabetes mellitus type 1 - patient states she takes 16 units of Lantus. I have decreased the dose by half and place patient on sliding scale coverage since patient has not taken a full meal since this morning. Closely follow CBGs and metabolic panel. Continue with hydration. Last hemoglobin A1c in February 2018 was 8.9.  Patient medication reconciliation shows patient is on metoprolol but patient states she does not take that.   DVT prophylaxis: SCDs. Code Status: Full code.  Family Communication: Discussed with patient.  Disposition Plan: Home.  Consults called: None.  Admission status: Observation.    Rise Patience MD [facility] Pager [phone].  If 7PM-7AM, please contact night-coverage [email or web address] Password [facility]  05/31/2016, 11:49 PM

## 2016-05-31 NOTE — ED Triage Notes (Signed)
Patient c/o abdominal pain and vomiting since this morning. Hx gastroparesis. Denies diarrhea.

## 2016-05-31 NOTE — ED Provider Notes (Signed)
[facility] DEPT Provider Note   CSN: [id number] Arrival date & time: 05/31/16  1436     History   Chief Complaint Chief Complaint  Patient presents with  . Emesis    HPI [name] is a 26 y.o. female.  HPI  Patient, with past medical history of type 1 diabetes and gastroparesis, presents with several episodes of vomiting and generalized abdominal pain that began upon waking up today. Patient has been admitted multiple times in the past for DKA. States she states her most recent admission was a few weeks ago and she has been feeling fine since today. Denies any diarrhea, urinary issues. States that she is compliant with medication. Patient denies chest pain, trouble breathing, blood in stool, blood in vomit, headache or blurry vision.  Past Medical History:  Diagnosis Date  . Anxiety   . Arthritis   . Asthma   . Diabetes mellitus 2007   IDDM.  poorly controlled, multiple admits with DKA  . Gallstones   . Gastroparesis   . Heart murmur   . Hepatic steatosis 11/26/2014   and hepatomegaly  . Hypertension    NOT CURRENTLY ON ANY BP MED  . Liver mass 11/26/2014  . Pancreatitis, acute 11/26/2014    Patient Active Problem List   Diagnosis Date Noted  . Hematuria 05/13/2016  . DKA, type 1, not at goal [facility]) 02/29/2016  . UTI (urinary tract infection) 01/22/2016  . Depression   . Diarrhea 11/09/2015  . Acute urinary retention   . Gastroparesis due to DM ([place]) 07/10/2015  . GERD (gastroesophageal reflux disease) 07/10/2015  . Depression with anxiety 07/10/2015  . Uncontrolled type 1 diabetes mellitus with hyperglycemia ([place])   . Gastroparesis 06/22/2015  . Altered mental status 06/22/2015  . Volume depletion 06/10/2015  . Protein-calorie malnutrition, severe 06/10/2015  . Hyperglycemia   . Intractable vomiting with nausea   . Elevated bilirubin   . Type 1 diabetes mellitus with hyperglycemia ([place]) 05/29/2015  . Hematemesis with nausea   . Intractable  vomiting 05/28/2015  . Intractable nausea and vomiting 05/28/2015  . Abdominal pain in female   . Abdominal pain 05/24/2015  . Hypertension 05/24/2015  . Dehydration   . Diabetic gastroparesis ([place])   . Chronic diastolic heart failure ([place]) 04/11/2015  . Hematemesis 04/08/2015  . DKA (diabetic ketoacidoses) ([place]) 03/22/2015  . Nausea vomiting and diarrhea 03/22/2015  . Nausea & vomiting   . Diabetic ketoacidosis without coma associated with type 1 diabetes mellitus ([place])   . S/P laparoscopic cholecystectomy 02/11/2015  . Status post laparoscopic cholecystectomy 02/11/2015  . Postextubation stridor   . Nausea and vomiting 11/27/2014  . Pancreatitis, acute 11/26/2014  . Volume overload 11/26/2014  . Hypokalemia 11/26/2014  . Hepatic steatosis 11/26/2014  . Liver mass 11/26/2014  . Sepsis ([place]) 11/25/2014  . Sinus tachycardia 11/25/2014  . Hypomagnesemia 11/25/2014  . Hypophosphatemia 11/25/2014  . Elevated amylase and lipase 11/25/2014  . DKA, type 1 ([place]) 11/24/2014  . Elevated LFTs 11/24/2014  . Acute kidney injury ([place]) 11/24/2014  . Migraine headache 11/24/2014  . Asthma 06/29/2012  . Uncontrolled type 1 diabetes mellitus ([place]) 06/19/2010  . Goiter, unspecified 06/19/2010    Past Surgical History:  Procedure Laterality Date  . CHOLECYSTECTOMY N/A 02/11/2015   Procedure: LAPAROSCOPIC CHOLECYSTECTOMY WITH INTRAOPERATIVE CHOLANGIOGRAM;  Surgeon: Greer Pickerel, MD;  Location: [facility];  Service: General;  Laterality: N/A;  . ESOPHAGOGASTRODUODENOSCOPY (EGD) WITH PROPOFOL Left 09/20/2014   Procedure: ESOPHAGOGASTRODUODENOSCOPY (EGD) WITH PROPOFOL;  Surgeon: Arta Silence,  MD;  Location: [facility];  Service: Endoscopy;  Laterality: Left;  . WISDOM TOOTH EXTRACTION      OB History    Gravida Para Term Preterm AB Living   2 1 0 1 1 1    SAB TAB Ectopic Multiple Live Births   0 1 0 0 1       Home Medications    Prior to Admission medications   Medication Sig Start Date  End Date Taking? Authorizing Provider  albuterol (PROVENTIL HFA;VENTOLIN HFA) 108 (90 BASE) MCG/ACT inhaler Inhale 2 puffs into the lungs every 6 (six) hours as needed for wheezing or shortness of breath.     [provider]  albuterol (PROVENTIL) (2.5 MG/3ML) 0.083% nebulizer solution Take 2.5 mg by nebulization every 6 (six) hours as needed for wheezing or shortness of breath.     [provider]  budesonide-formoterol (SYMBICORT) 160-4.5 MCG/ACT inhaler Inhale 2 puffs into the lungs 2 (two) times daily.     [provider]  escitalopram (LEXAPRO) 10 MG tablet Take 0.5 tablets (5 mg total) by mouth daily. 01/19/16   Barton Dubois, MD  famotidine (PEPCID) 20 MG tablet Take 1 tablet (20 mg total) by mouth at bedtime. 02/29/16   Antonietta Breach, PA-C  insulin aspart (NOVOLOG FLEXPEN) 100 UNIT/ML FlexPen Inject into the skin See admin instructions. Pt uses 1 unit for every 10 grams of carbs with meals and snacks for BS greater than 50.    [provider]  Insulin Glargine (BASAGLAR KWIKPEN) 100 UNIT/ML SOPN Inject 0.28 mLs (28 Units total) into the skin at bedtime. 05/16/16   Donne Hazel, MD  ketorolac (TORADOL) 10 MG tablet Take 1 tablet (10 mg total) by mouth every 6 (six) hours as needed. 05/16/16   Donne Hazel, MD  metoCLOPramide (REGLAN) 10 MG tablet Take 1 tablet (10 mg total) by mouth 3 (three) times daily before meals. 02/29/16   Antonietta Breach, PA-C  metoprolol tartrate (LOPRESSOR) 25 MG tablet Take 0.5 tablets (12.5 mg total) by mouth 2 (two) times daily. 05/10/16   Mariel Aloe, MD  pantoprazole (PROTONIX) 40 MG tablet Take 1 tablet (40 mg total) by mouth 2 (two) times daily. 02/25/16   Regalado, Jerald Kief A, MD  promethazine (PHENERGAN) 25 MG tablet Take 1 tablet (25 mg total) by mouth every 6 (six) hours as needed for nausea or vomiting. 05/13/16   Tegeler, Gwenyth Allegra, MD  Vitamin D, Ergocalciferol, (DRISDOL) 50000 units CAPS capsule Take 50,000 Units by  mouth once a week. 05/26/16   [provider]    Family History Family History  Problem Relation Age of Onset  . Heart disease Maternal Grandmother   . Heart disease Maternal Grandfather   . Diabetes Mother   . Hyperlipidemia Mother   . Hypertension Father   . Heart disease Father   . Hypertension Paternal Grandmother   . Cancer Paternal Grandfather     Social History Social History  Substance Use Topics  . Smoking status: Never Smoker  . Smokeless tobacco: Never Used  . Alcohol use No     Allergies   Peanut-containing drug products; Food; and Ultram [tramadol]   Review of Systems Review of Systems  Constitutional: Negative for appetite change, chills and fever.  HENT: Negative for ear pain, rhinorrhea, sneezing and sore throat.   Eyes: Negative for photophobia and visual disturbance.  Respiratory: Negative for cough, chest tightness, shortness of breath and wheezing.   Cardiovascular: Negative for chest pain and palpitations.  Gastrointestinal: Positive for abdominal pain and vomiting. Negative for blood in stool, constipation, diarrhea and nausea.  Genitourinary: Negative for dysuria, hematuria and urgency.  Musculoskeletal: Negative for myalgias.  Skin: Negative for rash.  Neurological: Negative for dizziness, weakness and light-headedness.     Physical Exam Updated Vital Signs BP (!) 144/97 (BP Location: Right Arm)   Pulse 78   Temp 98 F (36.7 C) (Oral)   Resp 18   LMP 05/01/2016   SpO2 100%   Physical Exam  Constitutional: She appears well-developed and well-nourished. She appears distressed.  Patient is actively vomiting.  HENT:  Head: Normocephalic and atraumatic.  Nose: Nose normal.  Eyes: Conjunctivae and EOM are normal. Right eye exhibits no discharge. Left eye exhibits no discharge. No scleral icterus.  Neck: Normal range of motion. Neck supple.  Cardiovascular: Regular rhythm, normal heart sounds and intact distal pulses.  Tachycardia  present.  Exam reveals no gallop and no friction rub.   No murmur heard. Pulmonary/Chest: Effort normal and breath sounds normal. No respiratory distress.  Abdominal: Soft. Bowel sounds are normal. She exhibits no distension. There is tenderness (Generalized). There is no guarding.  Musculoskeletal: Normal range of motion. She exhibits no edema.  Neurological: She is alert. She exhibits normal muscle tone. Coordination normal.  Skin: Skin is warm and dry. No rash noted.  Psychiatric: She has a normal mood and affect.  Nursing note and vitals reviewed.    ED Treatments / Results  Labs (all labs ordered are listed, but only abnormal results are displayed) Labs Reviewed  COMPREHENSIVE METABOLIC PANEL - Abnormal; Notable for the following:       Result Value   Glucose, Bld 152 (*)    Total Protein 8.2 (*)    ALT 11 (*)    All other components within normal limits  CBC - Abnormal; Notable for the following:    Platelets 475 (*)    All other components within normal limits  CBG MONITORING, ED - Abnormal; Notable for the following:    Glucose-Capillary 228 (*)    All other components within normal limits  LIPASE, BLOOD  RAPID URINE DRUG SCREEN, HOSP PERFORMED  PREGNANCY, URINE  URINALYSIS, ROUTINE W REFLEX MICROSCOPIC  I-STAT BETA HCG BLOOD, ED ([facility], [facility], [facility] ONLY)    EKG  EKG Interpretation None       Radiology No results found.  Procedures Procedures (including critical care time)  Medications Ordered in ED Medications  ondansetron (ZOFRAN-ODT) disintegrating tablet 4 mg (4 mg Oral Refused 05/31/16 1458)  promethazine (PHENERGAN) injection 12.5 mg (12.5 mg Intravenous Given 05/31/16 1641)  sodium chloride 0.9 % bolus 1,000 mL (0 mLs Intravenous Stopped 05/31/16 1915)  haloperidol lactate (HALDOL) injection 5 mg (5 mg Intravenous Given 05/31/16 1746)  ketorolac (TORADOL) 30 MG/ML injection 30 mg (30 mg Intravenous Given 05/31/16 1747)  capsaicin (ZOSTRIX) 0.025 % cream (  Topical Given 05/31/16 1952)  promethazine (PHENERGAN) injection 12.5 mg (12.5 mg Intravenous Given 05/31/16 2137)     Initial Impression / Assessment and Plan / ED Course  I have reviewed the triage vital signs and the nursing notes.  Pertinent labs & imaging results that were available during my care of the patient were reviewed by me and considered in my medical decision making (see chart for details).     Patient presents to the ED for nausea, vomiting and abdominal pain that began today. She has a history of several episodes of DKA and history of gastroparesis. Blood glucose today  was 152 on CMP. No evidence of acidosis. Normal anion gap of 12. Lipase normal. No leukocytosis noted on CBC. Patient was given fluid bolus, Phenergan, Haldol, capsaicin cream applied. Despite these above measures patient continues to have complaints of nausea and vomiting. Reports pain which was treated here with Toradol.  Patient will need to be admitted for persistent nausea and vomiting despite the above measures.  Spoke to Dr. Hal Hope who will admit the patient.  Final Clinical Impressions(s) / ED Diagnoses   Final diagnoses:  None    New Prescriptions New Prescriptions   No medications on file     Delia Heady, Hershal Coria 05/31/16 2148    Carmin Muskrat, MD 06/01/16 [phone]

## 2016-05-31 NOTE — ED Notes (Signed)
Writer attempted X 2 for blood draws, unsuccessful

## 2016-06-01 ENCOUNTER — Observation Stay (HOSPITAL_COMMUNITY): Payer: Medicaid Other

## 2016-06-01 DIAGNOSIS — K760 Fatty (change of) liver, not elsewhere classified: Secondary | ICD-10-CM | POA: Diagnosis present

## 2016-06-01 DIAGNOSIS — F419 Anxiety disorder, unspecified: Secondary | ICD-10-CM | POA: Diagnosis present

## 2016-06-01 DIAGNOSIS — F1120 Opioid dependence, uncomplicated: Secondary | ICD-10-CM | POA: Diagnosis present

## 2016-06-01 DIAGNOSIS — R160 Hepatomegaly, not elsewhere classified: Secondary | ICD-10-CM | POA: Diagnosis present

## 2016-06-01 DIAGNOSIS — Z794 Long term (current) use of insulin: Secondary | ICD-10-CM | POA: Diagnosis not present

## 2016-06-01 DIAGNOSIS — E860 Dehydration: Secondary | ICD-10-CM | POA: Diagnosis present

## 2016-06-01 DIAGNOSIS — Z91018 Allergy to other foods: Secondary | ICD-10-CM | POA: Diagnosis not present

## 2016-06-01 DIAGNOSIS — F1290 Cannabis use, unspecified, uncomplicated: Secondary | ICD-10-CM | POA: Diagnosis present

## 2016-06-01 DIAGNOSIS — Z9114 Patient's other noncompliance with medication regimen: Secondary | ICD-10-CM | POA: Diagnosis not present

## 2016-06-01 DIAGNOSIS — Z87898 Personal history of other specified conditions: Secondary | ICD-10-CM | POA: Diagnosis not present

## 2016-06-01 DIAGNOSIS — E1043 Type 1 diabetes mellitus with diabetic autonomic (poly)neuropathy: Secondary | ICD-10-CM | POA: Diagnosis present

## 2016-06-01 DIAGNOSIS — R011 Cardiac murmur, unspecified: Secondary | ICD-10-CM | POA: Diagnosis present

## 2016-06-01 DIAGNOSIS — I10 Essential (primary) hypertension: Secondary | ICD-10-CM | POA: Diagnosis not present

## 2016-06-01 DIAGNOSIS — K219 Gastro-esophageal reflux disease without esophagitis: Secondary | ICD-10-CM | POA: Diagnosis present

## 2016-06-01 DIAGNOSIS — R112 Nausea with vomiting, unspecified: Secondary | ICD-10-CM | POA: Diagnosis not present

## 2016-06-01 DIAGNOSIS — F319 Bipolar disorder, unspecified: Secondary | ICD-10-CM | POA: Diagnosis present

## 2016-06-01 DIAGNOSIS — J45909 Unspecified asthma, uncomplicated: Secondary | ICD-10-CM | POA: Diagnosis present

## 2016-06-01 DIAGNOSIS — M1990 Unspecified osteoarthritis, unspecified site: Secondary | ICD-10-CM | POA: Diagnosis present

## 2016-06-01 DIAGNOSIS — R1110 Vomiting, unspecified: Secondary | ICD-10-CM | POA: Diagnosis present

## 2016-06-01 DIAGNOSIS — R339 Retention of urine, unspecified: Secondary | ICD-10-CM | POA: Diagnosis present

## 2016-06-01 DIAGNOSIS — E876 Hypokalemia: Secondary | ICD-10-CM | POA: Diagnosis present

## 2016-06-01 DIAGNOSIS — Z7951 Long term (current) use of inhaled steroids: Secondary | ICD-10-CM | POA: Diagnosis not present

## 2016-06-01 DIAGNOSIS — E1143 Type 2 diabetes mellitus with diabetic autonomic (poly)neuropathy: Secondary | ICD-10-CM | POA: Diagnosis not present

## 2016-06-01 DIAGNOSIS — E1010 Type 1 diabetes mellitus with ketoacidosis without coma: Secondary | ICD-10-CM | POA: Diagnosis not present

## 2016-06-01 DIAGNOSIS — I110 Hypertensive heart disease with heart failure: Secondary | ICD-10-CM | POA: Diagnosis present

## 2016-06-01 DIAGNOSIS — Z91010 Allergy to peanuts: Secondary | ICD-10-CM | POA: Diagnosis not present

## 2016-06-01 DIAGNOSIS — I5032 Chronic diastolic (congestive) heart failure: Secondary | ICD-10-CM | POA: Diagnosis present

## 2016-06-01 DIAGNOSIS — E1065 Type 1 diabetes mellitus with hyperglycemia: Secondary | ICD-10-CM | POA: Diagnosis not present

## 2016-06-01 DIAGNOSIS — Y92009 Unspecified place in unspecified non-institutional (private) residence as the place of occurrence of the external cause: Secondary | ICD-10-CM | POA: Diagnosis not present

## 2016-06-01 DIAGNOSIS — Z79899 Other long term (current) drug therapy: Secondary | ICD-10-CM | POA: Diagnosis not present

## 2016-06-01 DIAGNOSIS — K3184 Gastroparesis: Secondary | ICD-10-CM | POA: Diagnosis not present

## 2016-06-01 LAB — COMPREHENSIVE METABOLIC PANEL
ALT: 10 U/L — ABNORMAL LOW (ref 14–54)
ANION GAP: 14 (ref 5–15)
AST: 14 U/L — ABNORMAL LOW (ref 15–41)
Albumin: 3.7 g/dL (ref 3.5–5.0)
Alkaline Phosphatase: 94 U/L (ref 38–126)
BUN: 19 mg/dL (ref 6–20)
CHLORIDE: 104 mmol/L (ref 101–111)
CO2: 19 mmol/L — ABNORMAL LOW (ref 22–32)
CREATININE: 0.52 mg/dL (ref 0.44–1.00)
Calcium: 8.9 mg/dL (ref 8.9–10.3)
GFR calc non Af Amer: >60 mL/min (ref >60)
Glucose, Bld: 280 mg/dL — ABNORMAL HIGH (ref 65–99)
Potassium: 4.1 mmol/L (ref 3.5–5.1)
SODIUM: 137 mmol/L (ref 135–145)
Total Bilirubin: 0.9 mg/dL (ref 0.3–1.2)
Total Protein: 7 g/dL (ref 6.5–8.1)

## 2016-06-01 LAB — CBC WITH DIFFERENTIAL/PLATELET
Basophils Absolute: 0 10*3/uL (ref 0.0–0.1)
Basophils Relative: 0 %
Eosinophils Absolute: 0 10*3/uL (ref 0.0–0.7)
Eosinophils Relative: 0 %
HEMATOCRIT: 35.6 % — AB (ref 36.0–46.0)
Hemoglobin: 11.8 g/dL — ABNORMAL LOW (ref 12.0–15.0)
LYMPHS ABS: 1.6 10*3/uL (ref 0.7–4.0)
Lymphocytes Relative: 18 %
MCH: 29.6 pg (ref 26.0–34.0)
MCHC: 33.1 g/dL (ref 30.0–36.0)
MCV: 89.4 fL (ref 78.0–100.0)
Monocytes Absolute: 0.7 10*3/uL (ref 0.1–1.0)
Monocytes Relative: 8 %
NEUTROS PCT: 74 %
Neutro Abs: 6.5 10*3/uL (ref 1.7–7.7)
Platelets: 407 10*3/uL — ABNORMAL HIGH (ref 150–400)
RBC: 3.98 MIL/uL (ref 3.87–5.11)
RDW: 13.5 % (ref 11.5–15.5)
WBC: 8.8 10*3/uL (ref 4.0–10.5)

## 2016-06-01 LAB — PREGNANCY, URINE: Preg Test, Ur: NEGATIVE

## 2016-06-01 LAB — URINALYSIS, ROUTINE W REFLEX MICROSCOPIC
BILIRUBIN URINE: NEGATIVE
Glucose, UA: >=500 mg/dL — AB
HGB URINE DIPSTICK: NEGATIVE
KETONES UR: 80 mg/dL — AB
Nitrite: NEGATIVE
PH: 6 (ref 5.0–8.0)
PROTEIN: NEGATIVE mg/dL
RBC / HPF: NONE SEEN RBC/hpf (ref 0–5)
Specific Gravity, Urine: 1.025 (ref 1.005–1.030)
Squamous Epithelial / LPF: NONE SEEN

## 2016-06-01 LAB — RAPID URINE DRUG SCREEN, HOSP PERFORMED
Amphetamines: NOT DETECTED
Barbiturates: NOT DETECTED
Benzodiazepines: NOT DETECTED
COCAINE: NOT DETECTED
OPIATES: NOT DETECTED
TETRAHYDROCANNABINOL: POSITIVE — AB

## 2016-06-01 LAB — GLUCOSE, CAPILLARY
GLUCOSE-CAPILLARY: 149 mg/dL — AB (ref 65–99)
Glucose-Capillary: 257 mg/dL — ABNORMAL HIGH (ref 65–99)
Glucose-Capillary: 171 mg/dL — ABNORMAL HIGH (ref 65–99)
Glucose-Capillary: 210 mg/dL — ABNORMAL HIGH (ref 65–99)

## 2016-06-01 MED ORDER — PANTOPRAZOLE SODIUM 40 MG PO TBEC
40.0000 mg | DELAYED_RELEASE_TABLET | Freq: Two times a day (BID) | ORAL | Status: DC
  Filled 2016-06-01: qty 1

## 2016-06-01 MED ORDER — METOPROLOL TARTRATE 12.5 MG HALF TABLET
12.5000 mg | ORAL_TABLET | Freq: Two times a day (BID) | ORAL | Status: DC
  Administered 2016-06-01 – 2016-06-06 (×5): 12.5 mg via ORAL
  Filled 2016-06-01 (×6): qty 1

## 2016-06-01 MED ORDER — SODIUM CHLORIDE 0.9 % IV SOLN
12.5000 mg | Freq: Four times a day (QID) | INTRAVENOUS | Status: DC | PRN
  Administered 2016-06-01 – 2016-06-05 (×11): 12.5 mg via INTRAVENOUS
  Filled 2016-06-01 (×11): qty 0.5

## 2016-06-01 MED ORDER — KETOROLAC TROMETHAMINE 30 MG/ML IJ SOLN
30.0000 mg | Freq: Four times a day (QID) | INTRAMUSCULAR | Status: AC
  Administered 2016-06-01 – 2016-06-03 (×9): 30 mg via INTRAVENOUS
  Filled 2016-06-01 (×9): qty 1

## 2016-06-01 MED ORDER — ESCITALOPRAM OXALATE 10 MG PO TABS
5.0000 mg | ORAL_TABLET | Freq: Every day | ORAL | Status: DC
  Administered 2016-06-03 – 2016-06-06 (×4): 5 mg via ORAL
  Filled 2016-06-01 (×5): qty 1

## 2016-06-01 MED ORDER — FAMOTIDINE 20 MG PO TABS: 20.0000 mg | ORAL_TABLET | Freq: Every day | ORAL | Status: DC

## 2016-06-01 NOTE — Progress Notes (Signed)
Inpatient Diabetes Program Recommendations  AACE/ADA: New Consensus Statement on Inpatient Glycemic Control (2015)  Target Ranges:  Prepandial:   less than 140 mg/dL      Peak postprandial:   less than 180 mg/dL (1-2 hours)      Critically ill patients:  140 - 180 mg/dL   Lab Results  Component Value Date   GLUCAP 257 (H) 06/01/2016   HGBA1C 8.9 (H) 03/01/2016    Review of Glycemic Control  Spoke with pt this am regarding her home meds. States MD lowered her Basaglar to 16 units QHS about 2 weeks ago. States she is not having as many lows as she was having with the 28 units. Still on Novolog s/s + meal coverage (1:10 CHO ratio)  Inpatient Diabetes Program Recommendations:    Add Lantus 8 units now. Lantus 16 units QAM, starting 5/15 Change Novolog to 0-9units Q4H while NPO/CL When CHO mod diet begins, add Novolog 4 units tidwc Need updated HgbA1C.  HgbA1C improving from 10.1% on 01/18/2016 to 8.9% on 03/01/2016. Need update.  Thank you. Lorenda Peck, RD, LDN, CDE Inpatient Diabetes Coordinator [phone]

## 2016-06-01 NOTE — Progress Notes (Signed)
PROGRESS NOTE    [name]  CBJ:[id number] DOB: 06-22-1990 DOA: 05/31/2016 PCP: Vicenta Aly, FNP  Outpatient Specialists:     Brief Narrative:   26 y/o fem Dm since age 39 Numerous hospitalizations for DKA, Gastroparesis Also opiate dependance and c/o abd pain and episodic leavuing the hopsital against med advice She also habitually uses Cannabis Re-admitted with [name] has had EGD 09/23/14 with diffuse gastritis   Assessment & Plan:   Principal Problem:   Nausea & vomiting Active Problems:   Uncontrolled type 1 diabetes mellitus with hyperglycemia ([place])   DM gastroparesis Minimize narcotics.  Continue IV reglan started this admit.  Will resume hokme dosage onc etaking PO Given recommendations for fluid incrementally increasing amounts  Phenergan to be given but as IV bag Saline 100 cc/h toradol temporarily for pain-NO OPIATES  DM mod control Recent a1c 10 Give 1/2 of usual lantus 16 U--given 8 u ssi coverage Sugars 171-260  Bipolar Holding home meds until can take po reliably Trial lexapro 5 later today if eating  Reflux Cont pepcid 20 od and protonix 40 bid  Urine retention I/o cath this am If fuerther issues wll require indwelling cath althought prefer not to keep for risk infections  Chr Diastolic HF stable   Inpatient pending resolution Full code Expect 2-3 days for recevoery   Consultants:   noen yet  Procedures:   None   Antimicrobials:   none    Subjective: Alert in some distress Vomit this am No blood in vomit  No fever no chill Needed I/o cath this am  Objective: Vitals:   05/31/16 2230 05/31/16 2322 06/01/16 0622 06/01/16 0625  BP: 135/88 138/73 (!) 145/100 (!) 149/96  Pulse: (!) 124 (!) 123 (!) 122 (!) 119  Resp:  16 14   Temp:  98.6 F (37 C) 98.5 F (36.9 C)   TempSrc:  Oral Oral   SpO2: 98% 100% 100%   Weight:  55.4 kg (122 lb 2.2 oz)    Height:  5\' 4"  (1.626 m)      Intake/Output  Summary (Last 24 hours) at 06/01/16 1254 Last data filed at 06/01/16 0858  Gross per 24 hour  Intake              485 ml  Output             1240 ml  Net             -755 ml   Filed Weights   05/31/16 2322  Weight: 55.4 kg (122 lb 2.2 oz)    Examination:  Alert slim pleasant in nad cta b s1 s2 no m/r/g abd soft no distension no rebound No le edema   Data Reviewed: I have personally reviewed following labs and imaging studies  CBC:  Recent Labs Lab 05/31/16 1544 06/01/16 0616  WBC 10.0 8.8  NEUTROABS  --  6.5  HGB 13.3 11.8*  HCT 40.3 35.6*  MCV 89.4 89.4  PLT 475* 160*   Basic Metabolic Panel:  Recent Labs Lab 05/31/16 1544 06/01/16 0427  NA 139 137  K 3.6 4.1  CL 102 104  CO2 25 19*  GLUCOSE 152* 280*  BUN 20 19  CREATININE 0.66 0.52  CALCIUM 9.7 8.9   GFR: Estimated Creatinine Clearance: 92 mL/min (by C-G formula based on SCr of 0.52 mg/dL). Liver Function Tests:  Recent Labs Lab 05/31/16 1544 06/01/16 0427  AST 19 14*  ALT 11* 10*  ALKPHOS 102 94  BILITOT  0.7 0.9  PROT 8.2* 7.0  ALBUMIN 4.5 3.7    Recent Labs Lab 05/31/16 1544  LIPASE 15   No results for input(s): AMMONIA in the last 168 hours. Coagulation Profile: No results for input(s): INR, PROTIME in the last 168 hours. Cardiac Enzymes: No results for input(s): CKTOTAL, CKMB, CKMBINDEX, TROPONINI in the last 168 hours. BNP (last 3 results) No results for input(s): PROBNP in the last 8760 hours. HbA1C: No results for input(s): HGBA1C in the last 72 hours. CBG:  Recent Labs Lab 05/31/16 2000 06/01/16 0813 06/01/16 1220  GLUCAP 228* 257* 171*   Lipid Profile: No results for input(s): CHOL, HDL, LDLCALC, TRIG, CHOLHDL, LDLDIRECT in the last 72 hours. Thyroid Function Tests: No results for input(s): TSH, T4TOTAL, FREET4, T3FREE, THYROIDAB in the last 72 hours. Anemia Panel: No results for input(s): VITAMINB12, FOLATE, FERRITIN, TIBC, IRON, RETICCTPCT in the last 72  hours. Urine analysis:    Component Value Date/Time   COLORURINE YELLOW 06/01/2016 0903   APPEARANCEUR CLOUDY (A) 06/01/2016 0903   LABSPEC 1.025 06/01/2016 0903   PHURINE 6.0 06/01/2016 0903   GLUCOSEU >=500 (A) 06/01/2016 0903   GLUCOSEU >=1000 11/07/2012 1205   HGBUR NEGATIVE 06/01/2016 0903   BILIRUBINUR NEGATIVE 06/01/2016 0903   KETONESUR 80 (A) 06/01/2016 0903   PROTEINUR NEGATIVE 06/01/2016 0903   UROBILINOGEN 0.2 11/24/2014 1045   NITRITE NEGATIVE 06/01/2016 0903   LEUKOCYTESUR LARGE (A) 06/01/2016 0903   Sepsis Labs: @LABRCNTIP (procalcitonin:4,lacticidven:4)  )No results found for this or any previous visit (from the past 240 hour(s)).       Radiology Studies: Dg Abd Acute W/chest  Result Date: 06/01/2016 CLINICAL DATA:  Nausea and vomiting. EXAM: DG ABDOMEN ACUTE W/ 1V CHEST COMPARISON:  Chest radiograph 05/14/2016, abdominal CT 02/20/2016 FINDINGS: Lower lung volumes from prior exam accentuating cardiac size. No consolidation or pleural fluid. No evidence pulmonary edema. Normal bowel gas pattern. No dilated bowel loops to suggest obstruction. Small to moderate stool burden. No evidence of free air. Cholecystectomy clips in the right upper quadrant. No radiopaque calculi. No osseous abnormalities. IMPRESSION: Normal bowel gas pattern.  No evidence of obstruction or free air. Low lung volumes without acute abnormality. Electronically Signed   By: Jeb Levering M.D.   On: 06/01/2016 00:32        Scheduled Meds: . escitalopram  5 mg Oral Daily  . famotidine  20 mg Oral QHS  . insulin aspart  0-9 Units Subcutaneous TID WC  . insulin glargine  8 Units Subcutaneous QHS  . ketorolac  30 mg Intravenous Q6H  . metoprolol tartrate  12.5 mg Oral BID  . mometasone-formoterol  2 puff Inhalation BID  . pantoprazole  40 mg Oral BID   Continuous Infusions: . sodium chloride 100 mL/hr at 06/01/16 1218  . promethazine (PHENERGAN) IV infusion       LOS: 0 days     Time spent:30    Verneita Griffes, MD [facility] ([phone]   If 7PM-7AM, please contact night-coverage [email or web address] Password [facility] 06/01/2016, 12:54 PM

## 2016-06-02 ENCOUNTER — Inpatient Hospital Stay (HOSPITAL_COMMUNITY): Payer: Medicaid Other

## 2016-06-02 LAB — GLUCOSE, CAPILLARY
GLUCOSE-CAPILLARY: 245 mg/dL — AB (ref 65–99)
GLUCOSE-CAPILLARY: 222 mg/dL — AB (ref 65–99)
Glucose-Capillary: 270 mg/dL — ABNORMAL HIGH (ref 65–99)
Glucose-Capillary: 201 mg/dL — ABNORMAL HIGH (ref 65–99)

## 2016-06-02 MED ORDER — METOPROLOL TARTRATE 5 MG/5ML IV SOLN
5.0000 mg | Freq: Four times a day (QID) | INTRAVENOUS | Status: DC | PRN
  Administered 2016-06-02 – 2016-06-06 (×9): 5 mg via INTRAVENOUS
  Filled 2016-06-02 (×8): qty 5

## 2016-06-02 MED ORDER — DIPHENHYDRAMINE HCL 25 MG PO CAPS
25.0000 mg | ORAL_CAPSULE | Freq: Four times a day (QID) | ORAL | Status: DC | PRN
Start: 2016-06-02 — End: 2016-06-06
  Administered 2016-06-03: 25 mg via ORAL
  Filled 2016-06-02: qty 1

## 2016-06-02 MED ORDER — PANTOPRAZOLE SODIUM 40 MG IV SOLR
40.0000 mg | Freq: Two times a day (BID) | INTRAVENOUS | Status: DC
  Administered 2016-06-02 – 2016-06-05 (×7): 40 mg via INTRAVENOUS
  Filled 2016-06-02 (×7): qty 40

## 2016-06-02 MED ORDER — SODIUM CHLORIDE 0.9 % IV SOLN: 250.0000 mg | Freq: Three times a day (TID) | INTRAVENOUS | Status: DC

## 2016-06-02 MED ORDER — METOPROLOL TARTRATE 5 MG/5ML IV SOLN
INTRAVENOUS | Status: AC
  Administered 2016-06-02: 5 mg via INTRAVENOUS
  Filled 2016-06-02: qty 5

## 2016-06-02 MED ORDER — METOCLOPRAMIDE HCL 5 MG/ML IJ SOLN
10.0000 mg | Freq: Three times a day (TID) | INTRAMUSCULAR | Status: DC
  Administered 2016-06-02 – 2016-06-06 (×13): 10 mg via INTRAVENOUS
  Filled 2016-06-02 (×13): qty 2

## 2016-06-02 MED ORDER — ERYTHROMYCIN BASE 250 MG PO TABS
250.0000 mg | ORAL_TABLET | Freq: Three times a day (TID) | ORAL | Status: DC
  Administered 2016-06-03 – 2016-06-06 (×7): 250 mg via ORAL
  Filled 2016-06-02 (×14): qty 1

## 2016-06-02 MED ORDER — METOCLOPRAMIDE HCL 5 MG/ML IJ SOLN
10.0000 mg | INTRAMUSCULAR | Status: AC
  Administered 2016-06-02: 10 mg via INTRAVENOUS
  Filled 2016-06-02: qty 2

## 2016-06-02 MED ORDER — INSULIN GLARGINE 100 UNIT/ML ~~LOC~~ SOLN
10.0000 [IU] | Freq: Every day | SUBCUTANEOUS | Status: DC
  Administered 2016-06-02: 10 [IU] via SUBCUTANEOUS
  Filled 2016-06-02: qty 0.1

## 2016-06-02 NOTE — Progress Notes (Signed)
PROGRESS NOTE    [name]  BDZ:[id number] DOB: March 29, 1990 DOA: 05/31/2016 PCP: Vicenta Aly, FNP  Outpatient Specialists:     Brief Narrative:   26 y/o fem Dm since age 9 Numerous hospitalizations for DKA, Gastroparesis Some evidence opiate dependance and c/o abd pain and episodically leaving the hopsital against med advice She also habitually uses Cannabis Re-admitted with [name] has had EGD 09/23/14 with diffuse gastritis   Assessment & Plan:   Principal Problem:   Nausea & vomiting Active Problems:   Nausea and vomiting   Uncontrolled type 1 diabetes mellitus with hyperglycemia ([place])   DM gastroparesis vs Cannabis hyperemesis syndrome Minimize narcotics.  Continue IV reglan started this admit.  Smokes 2-3 joints qod With protracted nausea vomiting, place NG tube 5/15 Consulted Dr.karki [facility] for recommendations Phenergan to be given but as IV bag Saline 100 cc/h toradol temporarily for pain-NO OPIATES  DM mod control Recent a1c 10 Give 1/2 of usual lantus 16 U--given 8 u-->increased to 10 units 5/50 ssi coverage Sugars 250 range  Bipolar Holding home meds until can take po reliably Trial lexapro 5 later today if eating  Reflux Cont pepcid 20 od and protonix 40 bid  Urine retention Patient needed intermittent catheterization 5/14 and indwelling catheter was placed 5/15 Attempting to bladder train and will need to have this reevaluated ongoing  Chr Diastolic HF stable-continue metoprolol with possible Placing when necessary metoprolol IV 5 mg heart rate >120, blood pressure >160   Inpatient pending resolution Full code Unclear disposal-patient still not taking by mouth   Consultants:   Gastroenterology  Procedures:   None   Antimicrobials:   none    Subjective:  Vomiting throughout the day 5/14 Vomiting at bedside 5/15 Not taking by mouth Abdominal pain-asking for opiates  Objective: Vitals:   06/01/16  0625 06/01/16 1329 06/01/16 2111 06/02/16 0508  BP: (!) 149/96 (!) 141/77 (!) 149/102 109/71  Pulse: (!) 119 (!) 118 (!) 116 (!) 110  Resp:   20 18  Temp:  98.4 F (36.9 C) 98.9 F (37.2 C) 98.5 F (36.9 C)  TempSrc:  Oral Oral Oral  SpO2:  100% 100% 100%  Weight:      Height:        Intake/Output Summary (Last 24 hours) at 06/02/16 1224 Last data filed at 06/02/16 0911  Gross per 24 hour  Intake             1760 ml  Output             1500 ml  Net              260 ml   Filed Weights   05/31/16 2322  Weight: 55.4 kg (122 lb 2.2 oz)    Examination:  Alert  cta b s1 s2 no m/r/g abd Slightly tender epigastrium no rebound no guarding No le edema   Data Reviewed: I have personally reviewed following labs and imaging studies  CBC:  Recent Labs Lab 05/31/16 1544 06/01/16 0616  WBC 10.0 8.8  NEUTROABS  --  6.5  HGB 13.3 11.8*  HCT 40.3 35.6*  MCV 89.4 89.4  PLT 475* 341*   Basic Metabolic Panel:  Recent Labs Lab 05/31/16 1544 06/01/16 0427  NA 139 137  K 3.6 4.1  CL 102 104  CO2 25 19*  GLUCOSE 152* 280*  BUN 20 19  CREATININE 0.66 0.52  CALCIUM 9.7 8.9   GFR: Estimated Creatinine Clearance: 92 mL/min (by C-G formula  based on SCr of 0.52 mg/dL). Liver Function Tests:  Recent Labs Lab 05/31/16 1544 06/01/16 0427  AST 19 14*  ALT 11* 10*  ALKPHOS 102 94  BILITOT 0.7 0.9  PROT 8.2* 7.0  ALBUMIN 4.5 3.7    Recent Labs Lab 05/31/16 1544  LIPASE 15   No results for input(s): AMMONIA in the last 168 hours. Coagulation Profile: No results for input(s): INR, PROTIME in the last 168 hours. Cardiac Enzymes: No results for input(s): CKTOTAL, CKMB, CKMBINDEX, TROPONINI in the last 168 hours. BNP (last 3 results) No results for input(s): PROBNP in the last 8760 hours. HbA1C: No results for input(s): HGBA1C in the last 72 hours. CBG:  Recent Labs Lab 06/01/16 0813 06/01/16 1220 06/01/16 1639 06/01/16 2108 06/02/16 0726  GLUCAP 257* 171*  149* 210* 270*   Lipid Profile: No results for input(s): CHOL, HDL, LDLCALC, TRIG, CHOLHDL, LDLDIRECT in the last 72 hours. Thyroid Function Tests: No results for input(s): TSH, T4TOTAL, FREET4, T3FREE, THYROIDAB in the last 72 hours. Anemia Panel: No results for input(s): VITAMINB12, FOLATE, FERRITIN, TIBC, IRON, RETICCTPCT in the last 72 hours. Urine analysis:    Component Value Date/Time   COLORURINE YELLOW 06/01/2016 0903   APPEARANCEUR CLOUDY (A) 06/01/2016 0903   LABSPEC 1.025 06/01/2016 0903   PHURINE 6.0 06/01/2016 0903   GLUCOSEU >=500 (A) 06/01/2016 0903   GLUCOSEU >=1000 11/07/2012 1205   HGBUR NEGATIVE 06/01/2016 0903   BILIRUBINUR NEGATIVE 06/01/2016 0903   KETONESUR 80 (A) 06/01/2016 0903   PROTEINUR NEGATIVE 06/01/2016 0903   UROBILINOGEN 0.2 11/24/2014 1045   NITRITE NEGATIVE 06/01/2016 0903   LEUKOCYTESUR LARGE (A) 06/01/2016 0903   Sepsis Labs: @LABRCNTIP (procalcitonin:4,lacticidven:4)  )No results found for this or any previous visit (from the past 240 hour(s)).       Radiology Studies: Dg Abd Acute W/chest  Result Date: 06/01/2016 CLINICAL DATA:  Nausea and vomiting. EXAM: DG ABDOMEN ACUTE W/ 1V CHEST COMPARISON:  Chest radiograph 05/14/2016, abdominal CT 02/20/2016 FINDINGS: Lower lung volumes from prior exam accentuating cardiac size. No consolidation or pleural fluid. No evidence pulmonary edema. Normal bowel gas pattern. No dilated bowel loops to suggest obstruction. Small to moderate stool burden. No evidence of free air. Cholecystectomy clips in the right upper quadrant. No radiopaque calculi. No osseous abnormalities. IMPRESSION: Normal bowel gas pattern.  No evidence of obstruction or free air. Low lung volumes without acute abnormality. Electronically Signed   By: Jeb Levering M.D.   On: 06/01/2016 00:32        Scheduled Meds: . escitalopram  5 mg Oral Daily  . famotidine  20 mg Oral QHS  . insulin aspart  0-9 Units Subcutaneous TID WC    . insulin glargine  10 Units Subcutaneous QHS  . ketorolac  30 mg Intravenous Q6H  . metoCLOPramide (REGLAN) injection  10 mg Intravenous TID AC  . metoprolol tartrate  12.5 mg Oral BID  . mometasone-formoterol  2 puff Inhalation BID  . pantoprazole (PROTONIX) IV  40 mg Intravenous Q12H   Continuous Infusions: . promethazine (PHENERGAN) IV infusion 12.5 mg (06/02/16 1056)     LOS: 1 day    Time spent:30    Verneita Griffes, MD [facility] ([phone]   If 7PM-7AM, please contact night-coverage [email or web address] Password [facility] 06/02/2016, 12:24 PM

## 2016-06-02 NOTE — Consult Note (Signed)
[facility] Consult  Referring Provider: Verneita Griffes, MD Primary Care Physician:  Vicenta Aly, [place] Primary Gastroenterologist: Arta Silence, MD  Reason for Consultation:  Intractable nausea and vomiting  HPI: [name] is a 26 y.o. African-American  female was admitted on 05/31/2016 complains of nausea and vomiting. Patient reports that she experiences these symptoms intermittently, she usually has 2 days of nausea and intractable vomiting which was slowly eventually subsides. She was diagnosed with type 1 diabetes at the age of 21 and has several admissions with presumed diabetic gastroparesis. EGD from 09/23/2014 performed by Dr. Paulita Fujita was basically unremarkable. Patient reports that she also has abdominal discomfort which is only relieved with IV narcotics. Normally she has about one bowel movement every day or every other day, and denies black stool or blood in the stool. Since she has been admitted, she has not been able to tolerate solids, in fact, he reports that she has not been able to keep down liquids either. She denies any unintentional weight loss or loss of appetite. There is no history of acid reflux, difficulty in swallowing or pain on swallowing. She states she has an endocrinologist that she follows up with on a regular basis. She is on insulin at home and denies missing any of her doses. She smokes marijuana on a regular basis.  At home she reports that she manages her symptoms with Zofran, as well as Reglan as needed.   Past Medical History:  Diagnosis Date  . Anxiety   . Arthritis   . Asthma   . Diabetes mellitus 2007   IDDM.  poorly controlled, multiple admits with DKA  . Gallstones   . Gastroparesis   . Heart murmur   . Hepatic steatosis 11/26/2014   and hepatomegaly  . Hypertension    NOT CURRENTLY ON ANY BP MED  . Liver mass 11/26/2014  . Pancreatitis, acute 11/26/2014    Past Surgical History:  Procedure Laterality Date  .  CHOLECYSTECTOMY N/A 02/11/2015   Procedure: LAPAROSCOPIC CHOLECYSTECTOMY WITH INTRAOPERATIVE CHOLANGIOGRAM;  Surgeon: Greer Pickerel, MD;  Location: [facility]S;  Service: General;  Laterality: N/A;  . ESOPHAGOGASTRODUODENOSCOPY (EGD) WITH PROPOFOL Left 09/20/2014   Procedure: ESOPHAGOGASTRODUODENOSCOPY (EGD) WITH PROPOFOL;  Surgeon: Arta Silence, MD;  Location: [facility] ENDOSCOPY;  Service: Endoscopy;  Laterality: Left;  . WISDOM TOOTH EXTRACTION      Prior to Admission medications   Medication Sig Start Date End Date Taking? Authorizing Provider  albuterol (PROVENTIL HFA;VENTOLIN HFA) 108 (90 BASE) MCG/ACT inhaler Inhale 2 puffs into the lungs every 6 (six) hours as needed for wheezing or shortness of breath.    Yes [provider]  albuterol (PROVENTIL) (2.5 MG/3ML) 0.083% nebulizer solution Take 2.5 mg by nebulization every 6 (six) hours as needed for wheezing or shortness of breath.    Yes [provider]  budesonide-formoterol (SYMBICORT) 160-4.5 MCG/ACT inhaler Inhale 2 puffs into the lungs 2 (two) times daily.    Yes [provider]  escitalopram (LEXAPRO) 10 MG tablet Take 0.5 tablets (5 mg total) by mouth daily. 01/19/16  Yes Barton Dubois, MD  famotidine (PEPCID) 20 MG tablet Take 1 tablet (20 mg total) by mouth at bedtime. 02/29/16  Yes Antonietta Breach, PA-C  insulin aspart (NOVOLOG FLEXPEN) 100 UNIT/ML FlexPen Inject into the skin See admin instructions. Pt uses 1 unit for every 10 grams of carbs with meals and snacks for BS greater than 50.   Yes [provider]  Insulin Glargine (BASAGLAR KWIKPEN) 100 UNIT/ML SOPN Inject  0.28 mLs (28 Units total) into the skin at bedtime. 05/16/16  Yes Donne Hazel, MD  metoCLOPramide (REGLAN) 10 MG tablet Take 1 tablet (10 mg total) by mouth 3 (three) times daily before meals. 02/29/16  Yes Antonietta Breach, PA-C  metoprolol tartrate (LOPRESSOR) 25 MG tablet Take 0.5 tablets (12.5 mg total) by mouth 2 (two) times daily. 05/10/16  Yes  Mariel Aloe, MD  pantoprazole (PROTONIX) 40 MG tablet Take 1 tablet (40 mg total) by mouth 2 (two) times daily. 02/25/16  Yes Regalado, Belkys A, MD  promethazine (PHENERGAN) 25 MG tablet Take 1 tablet (25 mg total) by mouth every 6 (six) hours as needed for nausea or vomiting. 05/13/16  Yes Tegeler, Gwenyth Allegra, MD  Vitamin D, Ergocalciferol, (DRISDOL) 50000 units CAPS capsule Take 50,000 Units by mouth once a week. 05/26/16  Yes [provider]  ketorolac (TORADOL) 10 MG tablet Take 1 tablet (10 mg total) by mouth every 6 (six) hours as needed. Patient not taking: Reported on 05/31/2016 05/16/16   Donne Hazel, MD    Current Facility-Administered Medications  Medication Dose Route Frequency Provider Last Rate Last Dose  . acetaminophen (TYLENOL) tablet 650 mg  650 mg Oral Q6H PRN Rise Patience, MD       Or  . acetaminophen (TYLENOL) suppository 650 mg  650 mg Rectal Q6H PRN Rise Patience, MD      . escitalopram (LEXAPRO) tablet 5 mg  5 mg Oral Daily Nita Sells, MD      . famotidine (PEPCID) tablet 20 mg  20 mg Oral QHS Samtani, Jai-Gurmukh, MD      . insulin aspart (novoLOG) injection 0-9 Units  0-9 Units Subcutaneous TID WC Rise Patience, MD   3 Units at 06/02/16 1253  . insulin glargine (LANTUS) injection 10 Units  10 Units Subcutaneous QHS Samtani, Jai-Gurmukh, MD      . ketorolac (TORADOL) 30 MG/ML injection 30 mg  30 mg Intravenous Q6H Nita Sells, MD   30 mg at 06/02/16 1250  . metoCLOPramide (REGLAN) injection 10 mg  10 mg Intravenous TID AC Nita Sells, MD   10 mg at 06/02/16 1250  . metoprolol tartrate (LOPRESSOR) injection 5 mg  5 mg Intravenous Q6H PRN Nita Sells, MD   5 mg at 06/02/16 1251  . metoprolol tartrate (LOPRESSOR) tablet 12.5 mg  12.5 mg Oral BID Nita Sells, MD   12.5 mg at 06/01/16 2235  . mometasone-formoterol (DULERA) 200-5 MCG/ACT inhaler 2 puff  2 puff Inhalation BID Rise Patience,  MD      . ondansetron [facility]) tablet 4 mg  4 mg Oral Q6H PRN Rise Patience, MD       Or  . ondansetron [facility]) injection 4 mg  4 mg Intravenous Q6H PRN Rise Patience, MD   4 mg at 06/02/16 0905  . ondansetron (ZOFRAN-ODT) disintegrating tablet 4 mg  4 mg Oral Once PRN Drenda Freeze, MD      . pantoprazole (PROTONIX) injection 40 mg  40 mg Intravenous Q12H Nita Sells, MD   40 mg at 06/02/16 1056  . promethazine (PHENERGAN) 12.5 mg in sodium chloride 0.9 % 100 mL infusion  12.5 mg Intravenous Q6H PRN Nita Sells, MD 100 mL/hr at 06/02/16 1056 12.5 mg at 06/02/16 1056    Allergies as of 05/31/2016 - Review Complete 05/31/2016  Allergen Reaction Noted  . Peanut-containing drug products Swelling and Other (See Comments) 12/04/2011  . Food Swelling and Other (  See Comments) 05/13/2016  . Ultram [tramadol] Itching 06/23/2015    Family History  Problem Relation Age of Onset  . Heart disease Maternal Grandmother   . Heart disease Maternal Grandfather   . Diabetes Mother   . Hyperlipidemia Mother   . Hypertension Father   . Heart disease Father   . Hypertension Paternal Grandmother   . Cancer Paternal Grandfather     Social History   Social History  . Marital status: Single    Spouse name: N/A  . Number of children: 1  . Years of education: 56   Occupational History  . Unemployed.    Social History Main Topics  . Smoking status: Never Smoker  . Smokeless tobacco: Never Used  . Alcohol use No  . Drug use: Yes    Types: Marijuana     Comment: Denies   . Sexual activity: Not Currently   Other Topics Concern  . Not on file   Social History Narrative   Lives with her mother.  Unemployed.  Trying to qualify for disability.    Review of Systems: Positive URK:YHCWCB and intractable vomiting along with upper abdominal pain GI: Described in detail in HPI.    Gen: Denies any fever, chills, rigors, night sweats, anorexia, fatigue, weakness,  malaise, involuntary weight loss, and sleep disorder CV: Denies chest pain, angina, palpitations, syncope, orthopnea, PND, peripheral edema, and claudication. Resp: Denies dyspnea, cough, sputum, wheezing, coughing up blood. GU : Denies urinary burning, blood in urine, urinary frequency, urinary hesitancy, nocturnal urination, and urinary incontinence. MS: Denies joint pain or swelling.  Denies muscle weakness, cramps, atrophy.  Derm: Denies rash, itching, oral ulcerations, hives, unhealing ulcers.  Psych: Denies depression, anxiety, memory loss, suicidal ideation, hallucinations,  and confusion. Heme: Denies bruising, bleeding, and enlarged lymph nodes. Neuro:  Denies any headaches, dizziness, paresthesias. Endo:   DM since the age of 66, thyroid, adrenal function.  Physical Exam: Vital signs in last 24 hours: Temp:  [98 F (36.7 C)-98.9 F (37.2 C)] 98 F (36.7 C) (05/15 1450) Pulse Rate:  [110-124] 124 (05/15 1450) Resp:  [16-20] 16 (05/15 1450) BP: (109-149)/(71-102) 139/100 (05/15 1450) SpO2:  [100 %] 100 % (05/15 1450)    General:   Alert,  Well-developed, well-nourished, in mild distress due to pain Head:  Normocephalic and atraumatic. Eyes:  Sclera clear, no icterus.   Conjunctiva pink. Ears:  Normal auditory acuity. Nose:  No deformity, discharge,  or lesions. Mouth:  No deformity or lesions.  Oropharynx pink & moist. Neck:  Supple; no masses or thyromegaly. Lungs:  Clear throughout to auscultation.   No wheezes, crackles, or rhonchi. No acute distress. Heart:  Regular rate and rhythm; no murmurs, clicks, rubs,  or gallops. Extremities:  Without clubbing or edema. Neurologic:  Alert and  oriented x4;  grossly normal neurologically. Skin:  Intact without significant lesions or rashes. Psych:  Alert and cooperative. Normal mood and affect. Abdomen:  Soft, nontender and nondistended. No masses, hepatosplenomegaly or hernias noted. Normal bowel sounds, without guarding, and  without rebound.         Lab Results:  Recent Labs  05/31/16 1544 06/01/16 0616  WBC 10.0 8.8  HGB 13.3 11.8*  HCT 40.3 35.6*  PLT 475* 407*   BMET  Recent Labs  05/31/16 1544 06/01/16 0427  NA 139 137  K 3.6 4.1  CL 102 104  CO2 25 19*  GLUCOSE 152* 280*  BUN 20 19  CREATININE 0.66 0.52  CALCIUM 9.7 8.9  LFT  Recent Labs  06/01/16 0427  PROT 7.0  ALBUMIN 3.7  AST 14*  ALT 10*  ALKPHOS 94  BILITOT 0.9   PT/INR No results for input(s): LABPROT, INR in the last 72 hours.  Studies/Results: Dg Abd Acute W/chest  Result Date: 06/01/2016 CLINICAL DATA:  Nausea and vomiting. EXAM: DG ABDOMEN ACUTE W/ 1V CHEST COMPARISON:  Chest radiograph 05/14/2016, abdominal CT 02/20/2016 FINDINGS: Lower lung volumes from prior exam accentuating cardiac size. No consolidation or pleural fluid. No evidence pulmonary edema. Normal bowel gas pattern. No dilated bowel loops to suggest obstruction. Small to moderate stool burden. No evidence of free air. Cholecystectomy clips in the right upper quadrant. No radiopaque calculi. No osseous abnormalities. IMPRESSION: Normal bowel gas pattern.  No evidence of obstruction or free air. Low lung volumes without acute abnormality. Electronically Signed   By: Jeb Levering M.D.   On: 06/01/2016 00:32    Impression: Intractable nausea and vomiting likely related to diabetic gastroparesis. This could also be related to cyclical vomiting syndrome from chronic marijuana use. Her blood sugars range from 150- 280. No signs of acidosis and renal failure. LFTs within normal limits.  Plan: Start IV erythromycin 250 mg 3 times a day, transition to by mouth when tolerated. Small, frequent meals composed of low fiber, low residue diet when able to tolerate by mouth. Refrain from use of narcotics or any medication which may slow gastric motility and worsen nausea and vomiting. Discussed about the importance of stopping marijuana use. Strict blood  sugar control is of utmost importance   LOS: 1 day   Ronnette Juniper, MD  06/02/2016, 3:53 PM  Pager [phone] If no answer or after 5 PM call [phone]

## 2016-06-02 NOTE — Progress Notes (Signed)
Inpatient Diabetes Program Recommendations  AACE/ADA: New Consensus Statement on Inpatient Glycemic Control (2015)  Target Ranges:  Prepandial:   less than 140 mg/dL      Peak postprandial:   less than 180 mg/dL (1-2 hours)      Critically ill patients:  140 - 180 mg/dL   Lab Results  Component Value Date   GLUCAP 245 (H) 06/02/2016   HGBA1C 8.9 (H) 03/01/2016    Review of Glycemic Control  Blood sugars > 180 mg/dL On Basaglar 16 units at home (lowered from 28 units) Poor po intake.  Inpatient Diabetes Program Recommendations:    Increase Lantus to 12 units QHS  Will follow.  Thank you. Lorenda Peck, RD, LDN, CDE Inpatient Diabetes Coordinator [phone]

## 2016-06-03 ENCOUNTER — Inpatient Hospital Stay (HOSPITAL_COMMUNITY): Payer: Medicaid Other

## 2016-06-03 LAB — BASIC METABOLIC PANEL
Anion gap: 21 — ABNORMAL HIGH (ref 5–15)
BUN: 23 mg/dL — ABNORMAL HIGH (ref 6–20)
BUN: 22 mg/dL — ABNORMAL HIGH (ref 6–20)
CALCIUM: 9.3 mg/dL (ref 8.9–10.3)
CHLORIDE: 105 mmol/L (ref 101–111)
CHLORIDE: 114 mmol/L — AB (ref 101–111)
CO2: 7 mmol/L — AB (ref 22–32)
CO2: <7 mmol/L — ABNORMAL LOW (ref 22–32)
CREATININE: 1 mg/dL (ref 0.44–1.00)
Calcium: 9 mg/dL (ref 8.9–10.3)
Creatinine, Ser: 0.97 mg/dL (ref 0.44–1.00)
GFR calc Af Amer: >60 mL/min (ref >60)
GFR calc non Af Amer: >60 mL/min (ref >60)
GLUCOSE: 407 mg/dL — AB (ref 65–99)
Glucose, Bld: 323 mg/dL — ABNORMAL HIGH (ref 65–99)
POTASSIUM: 6 mmol/L — AB (ref 3.5–5.1)
Potassium: 5 mmol/L (ref 3.5–5.1)
Sodium: 133 mmol/L — ABNORMAL LOW (ref 135–145)
Sodium: 136 mmol/L (ref 135–145)

## 2016-06-03 LAB — CBC
HEMATOCRIT: 43.7 % (ref 36.0–46.0)
HEMOGLOBIN: 14.2 g/dL (ref 12.0–15.0)
MCH: 30.1 pg (ref 26.0–34.0)
MCHC: 32.5 g/dL (ref 30.0–36.0)
MCV: 92.6 fL (ref 78.0–100.0)
Platelets: 431 10*3/uL — ABNORMAL HIGH (ref 150–400)
RBC: 4.72 MIL/uL (ref 3.87–5.11)
RDW: 13.7 % (ref 11.5–15.5)
WBC: 13 10*3/uL — ABNORMAL HIGH (ref 4.0–10.5)

## 2016-06-03 LAB — URINALYSIS, ROUTINE W REFLEX MICROSCOPIC
BILIRUBIN URINE: NEGATIVE
Bacteria, UA: NONE SEEN
HGB URINE DIPSTICK: NEGATIVE
KETONES UR: 80 mg/dL — AB
Nitrite: NEGATIVE
PH: 5 (ref 5.0–8.0)
PROTEIN: 30 mg/dL — AB
Specific Gravity, Urine: 1.015 (ref 1.005–1.030)

## 2016-06-03 LAB — GLUCOSE, CAPILLARY
GLUCOSE-CAPILLARY: 376 mg/dL — AB (ref 65–99)
GLUCOSE-CAPILLARY: 314 mg/dL — AB (ref 65–99)
GLUCOSE-CAPILLARY: 169 mg/dL — AB (ref 65–99)
Glucose-Capillary: 236 mg/dL — ABNORMAL HIGH (ref 65–99)
Glucose-Capillary: 212 mg/dL — ABNORMAL HIGH (ref 65–99)

## 2016-06-03 MED ORDER — SODIUM CHLORIDE 0.9 % IV SOLN: INTRAVENOUS | Status: DC

## 2016-06-03 MED ORDER — INSULIN GLARGINE 100 UNIT/ML ~~LOC~~ SOLN
10.0000 [IU] | Freq: Two times a day (BID) | SUBCUTANEOUS | Status: DC
  Filled 2016-06-03: qty 0.1

## 2016-06-03 MED ORDER — SODIUM CHLORIDE 0.9 % IV BOLUS (SEPSIS)
1000.0000 mL | Freq: Once | INTRAVENOUS | Status: AC
  Administered 2016-06-03: 1000 mL via INTRAVENOUS

## 2016-06-03 MED ORDER — DEXTROSE-NACL 5-0.45 % IV SOLN
INTRAVENOUS | Status: DC
  Administered 2016-06-03 – 2016-06-05 (×4): via INTRAVENOUS

## 2016-06-03 MED ORDER — ACETAMINOPHEN 10 MG/ML IV SOLN
1000.0000 mg | Freq: Four times a day (QID) | INTRAVENOUS | Status: AC
  Administered 2016-06-03 – 2016-06-04 (×4): 1000 mg via INTRAVENOUS
  Filled 2016-06-03 (×6): qty 100

## 2016-06-03 MED ORDER — SODIUM CHLORIDE 0.9 % IV SOLN
INTRAVENOUS | Status: DC
  Administered 2016-06-03: 1.5 [IU]/h via INTRAVENOUS
  Administered 2016-06-04: 4.4 [IU]/h via INTRAVENOUS
  Filled 2016-06-03 (×2): qty 1

## 2016-06-03 MED ORDER — FAMOTIDINE IN NACL 20-0.9 MG/50ML-% IV SOLN
20.0000 mg | Freq: Once | INTRAVENOUS | Status: AC
Start: 2016-06-03 — End: 2016-06-03
  Administered 2016-06-03: 20 mg via INTRAVENOUS
  Filled 2016-06-03: qty 50

## 2016-06-03 MED ORDER — SODIUM CHLORIDE 0.9 % IV SOLN
INTRAVENOUS | Status: DC
  Administered 2016-06-03: 19:00:00 via INTRAVENOUS

## 2016-06-03 MED ORDER — SODIUM CHLORIDE 0.9 % IV BOLUS (SEPSIS): 1000.0000 mL | Freq: Once | INTRAVENOUS | Status: AC

## 2016-06-03 MED ORDER — INSULIN GLARGINE 100 UNIT/ML ~~LOC~~ SOLN
15.0000 [IU] | Freq: Every day | SUBCUTANEOUS | Status: DC
  Filled 2016-06-03: qty 0.15

## 2016-06-03 MED ORDER — SODIUM CHLORIDE 0.9 % IV SOLN
INTRAVENOUS | Status: DC
  Administered 2016-06-03: 21:00:00 via INTRAVENOUS

## 2016-06-03 MED ORDER — DIPHENHYDRAMINE HCL 50 MG/ML IJ SOLN
25.0000 mg | Freq: Once | INTRAMUSCULAR | Status: AC
  Administered 2016-06-03: 25 mg via INTRAVENOUS
  Filled 2016-06-03: qty 0.5

## 2016-06-03 NOTE — Progress Notes (Signed)
Patient transferred to ICU/ Stepdown in stable condition. Report call to RN. Patient informed and accepted transfer.

## 2016-06-03 NOTE — Progress Notes (Signed)
[facility] Progress Note  Lynia Landry 26 y.o. [birth year]   Subjective: Complaining of nausea and vomiting. Complaining of pain all over stating that current pain meds not helping. NG tube in place (placed yesterday evening due to continued N/V) with dark brown/reddish fluid being drained. 1.3 L removed from NG yesterday. Patient had red jello today with clear liquids. Nurse present during my evaluation.  Objective: Vital signs in last 24 hours: Vitals:   06/02/16 2039 06/03/16 0525  BP: (!) 160/107 137/88  Pulse: (!) 143 (!) 117  Resp: 16 18  Temp: 98.7 F (37.1 C) 98.4 F (36.9 C)    Physical Exam: Gen: lethargic, thin, no acute distress HEENT: anicteric sclera, NG noted CV: RRR Chest: CTA B Abd: soft, nontender, nondistended, +BS Ext: no edema  Lab Results:  Recent Labs  06/01/16 0427 06/03/16 0857  NA 137 133*  K 4.1 5.0  CL 104 105  CO2 19* 7*  GLUCOSE 280* 407*  BUN 19 23*  CREATININE 0.52 1.00  CALCIUM 8.9 9.3    Recent Labs  05/31/16 1544 06/01/16 0427  AST 19 14*  ALT 11* 10*  ALKPHOS 102 94  BILITOT 0.7 0.9  PROT 8.2* 7.0  ALBUMIN 4.5 3.7    Recent Labs  06/01/16 0616 06/03/16 0857  WBC 8.8 13.0*  NEUTROABS 6.5  --   HGB 11.8* 14.2  HCT 35.6* 43.7  MCV 89.4 92.6  PLT 407* 431*   No results for input(s): LABPROT, INR in the last 72 hours.    Assessment/Plan: Intractable N/V - multifactorial. Strict NPO to help determine if NG is still needed. Continue IV E-mycin. Minimize narcotic pain meds in long-term. If minimal NG output when NPO then d/c NG tomorrow morning or this evening. Agree with Dr. Encarnacion Slates recs/plan. Continue supportive care. Will follow.   [place] C. 06/03/2016, 11:28 AM   Pager [phone]  AFTER 5 pm or on weekends call [phone] ID: [name], female   DOB: 14-Apr-1990, 26 y.o.   MRN: [id number]

## 2016-06-03 NOTE — Care Management Note (Signed)
Case Management Note  Patient Details  Name: Mallisa Alameda MRN: [id number] Date of Birth: 10-29-1990  Subjective/Objective: 26 y/o f admitted w/nausea & vomiting. Readmit-4/10-4/28 gastroparesis. From home.                   Action/Plan:d/c plan home.   Expected Discharge Date:                  Expected Discharge Plan:  Home/Self Care  In-House Referral:  NA  Discharge planning Services  CM Consult  Post Acute Care Choice:  NA Choice offered to:  NA  DME Arranged:  N/A DME Agency:  NA  HH Arranged:  NA HH Agency:  NA ([place] error)  Status of Service:  In process, will continue to follow  If discussed at Long Length of Stay Meetings, dates discussed:    Additional Comments:  Dessa Phi, RN 06/03/2016, 1:04 PM

## 2016-06-03 NOTE — Progress Notes (Signed)
Notified Dr.Silva of pt HR sustaining at 135-140, RR at 22.  Orders received for NS bolus and then remain at 75 cc/hr.Stacey Drain

## 2016-06-03 NOTE — Progress Notes (Signed)
Inpatient Diabetes Program Recommendations  AACE/ADA: New Consensus Statement on Inpatient Glycemic Control (2015)  Target Ranges:  Prepandial:   less than 140 mg/dL      Peak postprandial:   less than 180 mg/dL (1-2 hours)      Critically ill patients:  140 - 180 mg/dL   Lab Results  Component Value Date   GLUCAP 376 (H) 06/03/2016   HGBA1C 8.9 (H) 03/01/2016    Review of Glycemic Control 7 admissions and 2 ED visit within last 6 months  Sub-optimal glycemic control. Last HgbA1C on 03/01/2016 - 8.9%.    Inpatient Diabetes Program Recommendations: Lantus 16 units Q24H Novolog 5 units tidwc + 0-9 units tidwc and hs. Need updated HgbA1C  Note from 05/14/2016: Spoke with pt regarding her recent hospitalizations for DKA. Pt states "I really do take my insulin." Inpatient Diabetes Coordinators have discussed importance of checking blood sugars, taking insulin as prescribed, and f/u with PCP for management. Pt instructed to bring glucose logs and meter to each visit. Pt also states she does not have any problems in getting her insulin or monitoring supplies.  Will follow. Thank you. Lorenda Peck, RD, LDN, CDE Inpatient Diabetes Coordinator [phone]

## 2016-06-03 NOTE — Progress Notes (Signed)
PROGRESS NOTE [facility]   [name]   [id number]:[id number] DOB: Jun 09, 1990  DOA: 05/31/2016 PCP: Vicenta Aly, FNP   Brief Narrative:  26 years old female with type 1 diabetes since age 78 with recurrent admissions for diabetes gastroparesis. Readmitted for intractable nausea and vomiting suspected gastroparesis flare. GI was consulted recommending NG tube placement and erythromycin.  Of note patient evidence of opiate dependence complaining of abdominal pain prior history of leaving the hospital AGAINST MEDICAL ADVICE  Subjective: Patient seen and examined this morning continues to complain of nausea and vomiting and abdominal pain. Patient requesting narcotic medication. Patient was place on strict nothing by mouth by GI given high output in NGT. Afebrile   Assessment & Plan: Intractable nausea and vomiting, likely multifactorial due to diabetes gastroparesis and cannabis hyperemesis syndrome. EGD in 2016 shows diffuse gastritis. Strict nothing by mouth On IV erythromycin GI recommendations appreciated If low output from NG tube can remove tonight or tomorrow in the morning. Continue IV fluids Will add IV Tylenol for pain control One-time dose of Pepcid and Benadryl given  Type 1 diabetes mellitus not well controlled Most recent A1c is 10, patient seemed not very low compliance with medication Adjust insulin pending CBGs results CBG has been up and down during hospital stay Continue SSI Will get a stat BMP - her anion gap was slight elevated this AM, ? Due to high output from NGT. If Gap still elevated will start patient on insulin drip  Continue to monitor CBGs Bolus of IVF given   Bipolar Holding medications given no oral intake  Chronic diastolic CHF (Grade 1 diastolic dysfunction) Seems to be compensated On IV metoprolol, when necessary if heart rate of 120 and blood pressure of 160  DVT prophylaxis: Ambulation  Code Status: FULL  Family  Communication: None at bedside  Disposition Plan: Home when medically stable   Consultants:   GI - [facility]   Procedures:   None   Antimicrobials: Anti-infectives    Start     Dose/Rate Route Frequency Ordered Stop   06/02/16 1700  erythromycin (E-MYCIN) tablet 250 mg     250 mg Oral 3 times daily before meals 06/02/16 1627     06/02/16 1615  erythromycin 250 mg in sodium chloride 0.9 % 100 mL IVPB  Status:  Discontinued     250 mg 100 mL/hr over 60 Minutes Intravenous Every 8 hours 06/02/16 1602 06/02/16 1627        Objective: Vitals:   06/02/16 2039 06/03/16 0525 06/03/16 1300 06/03/16 1457  BP: (!) 160/107 137/88 124/71 124/71  Pulse: (!) 143 (!) 117 (!) 129   Resp: 16 18  (!) 23  Temp: 98.7 F (37.1 C) 98.4 F (36.9 C) 98 F (36.7 C)   TempSrc: Oral Oral Oral   SpO2: 100% 100%  100%  Weight:      Height:        Intake/Output Summary (Last 24 hours) at 06/03/16 1809 Last data filed at 06/03/16 1736  Gross per 24 hour  Intake          1568.33 ml  Output             2575 ml  Net         -1006.67 ml   Filed Weights   05/31/16 2322  Weight: 55.4 kg (122 lb 2.2 oz)    Examination:  General exam: Appears calm and comfortable  HEENT: AC/AT, NGT in place draining Respiratory system: Clear to auscultation. No  wheezes,crackle or rhonchi Cardiovascular system: S1 & S2 heard, RRR. No JVD, murmurs, rubs or gallops Gastrointestinal system: Abdomen is nondistended, soft and nontender. No organomegaly or masses felt. Normal bowel sounds heard. Central nervous system: Alert and oriented. No focal neurological deficits. Extremities: No pedal edema.   Skin: No rashes, lesions or ulcers Psychiatry: Judgement and insight appear normal. Mood & affect appropriate.   Data Reviewed: I have personally reviewed following labs and imaging studies  CBC:  Recent Labs Lab 05/31/16 1544 06/01/16 0616 06/03/16 0857  WBC 10.0 8.8 13.0*  NEUTROABS  --  6.5  --   HGB 13.3 11.8*  14.2  HCT 40.3 35.6* 43.7  MCV 89.4 89.4 92.6  PLT 475* 407* 841*   Basic Metabolic Panel:  Recent Labs Lab 05/31/16 1544 06/01/16 0427 06/03/16 0857  NA 139 137 133*  K 3.6 4.1 5.0  CL 102 104 105  CO2 25 19* 7*  GLUCOSE 152* 280* 407*  BUN 20 19 23*  CREATININE 0.66 0.52 1.00  CALCIUM 9.7 8.9 9.3   GFR: Estimated Creatinine Clearance: 73.6 mL/min (by C-G formula based on SCr of 1 mg/dL). Liver Function Tests:  Recent Labs Lab 05/31/16 1544 06/01/16 0427  AST 19 14*  ALT 11* 10*  ALKPHOS 102 94  BILITOT 0.7 0.9  PROT 8.2* 7.0  ALBUMIN 4.5 3.7    Recent Labs Lab 05/31/16 1544  LIPASE 15   No results for input(s): AMMONIA in the last 168 hours. Coagulation Profile: No results for input(s): INR, PROTIME in the last 168 hours. Cardiac Enzymes: No results for input(s): CKTOTAL, CKMB, CKMBINDEX, TROPONINI in the last 168 hours. BNP (last 3 results) No results for input(s): PROBNP in the last 8760 hours. HbA1C: No results for input(s): HGBA1C in the last 72 hours. CBG:  Recent Labs Lab 06/02/16 1705 06/02/16 2045 06/03/16 0749 06/03/16 1124 06/03/16 1732  GLUCAP 222* 201* 376* 236* 314*   Lipid Profile: No results for input(s): CHOL, HDL, LDLCALC, TRIG, CHOLHDL, LDLDIRECT in the last 72 hours. Thyroid Function Tests: No results for input(s): TSH, T4TOTAL, FREET4, T3FREE, THYROIDAB in the last 72 hours. Anemia Panel: No results for input(s): VITAMINB12, FOLATE, FERRITIN, TIBC, IRON, RETICCTPCT in the last 72 hours. Sepsis Labs: No results for input(s): PROCALCITON, LATICACIDVEN in the last 168 hours.  No results found for this or any previous visit (from the past 240 hour(s)).    Radiology Studies: Dg Abd Portable 1v  Result Date: 06/02/2016 CLINICAL DATA:  Evaluate NG tube placement EXAM: PORTABLE ABDOMEN - 1 VIEW COMPARISON:  May 31, 2016 FINDINGS: The side port of the NG tube terminates just below the GE junction. The distal tip is in the body  of the stomach. IMPRESSION: NG tube placement as above. Electronically Signed   By: Dorise Bullion III M.D   On: 06/02/2016 18:44    Scheduled Meds: . erythromycin  250 mg Oral TID AC  . escitalopram  5 mg Oral Daily  . famotidine  20 mg Oral QHS  . insulin aspart  0-9 Units Subcutaneous TID WC  . insulin glargine  10 Units Subcutaneous BID  . metoCLOPramide (REGLAN) injection  10 mg Intravenous TID AC  . metoprolol tartrate  12.5 mg Oral BID  . mometasone-formoterol  2 puff Inhalation BID  . pantoprazole (PROTONIX) IV  40 mg Intravenous Q12H   Continuous Infusions: . sodium chloride    . acetaminophen Stopped (06/03/16 1442)  . promethazine (PHENERGAN) IV infusion 12.5 mg (06/03/16 1147)  LOS: 2 days    Chipper Oman, MD Pager: Text Page via [email or web address]  [phone]  If 7PM-7AM, please contact night-coverage [email or web address] Password [facility] 06/03/2016, 6:09 PM

## 2016-06-04 DIAGNOSIS — K3184 Gastroparesis: Secondary | ICD-10-CM

## 2016-06-04 DIAGNOSIS — E1143 Type 2 diabetes mellitus with diabetic autonomic (poly)neuropathy: Secondary | ICD-10-CM

## 2016-06-04 DIAGNOSIS — E1010 Type 1 diabetes mellitus with ketoacidosis without coma: Secondary | ICD-10-CM

## 2016-06-04 LAB — BASIC METABOLIC PANEL
ANION GAP: 10 (ref 5–15)
ANION GAP: 8 (ref 5–15)
Anion gap: 8 (ref 5–15)
Anion gap: 10 (ref 5–15)
BUN: 16 mg/dL (ref 6–20)
BUN: 9 mg/dL (ref 6–20)
BUN: 6 mg/dL (ref 6–20)
CHLORIDE: 112 mmol/L — AB (ref 101–111)
CHLORIDE: 108 mmol/L (ref 101–111)
CO2: <7 mmol/L — ABNORMAL LOW (ref 22–32)
CO2: 11 mmol/L — ABNORMAL LOW (ref 22–32)
CO2: 13 mmol/L — ABNORMAL LOW (ref 22–32)
CO2: 15 mmol/L — ABNORMAL LOW (ref 22–32)
CO2: 15 mmol/L — AB (ref 22–32)
CREATININE: 0.4 mg/dL — AB (ref 0.44–1.00)
Calcium: 8.4 mg/dL — ABNORMAL LOW (ref 8.9–10.3)
Calcium: 7.5 mg/dL — ABNORMAL LOW (ref 8.9–10.3)
Calcium: 7.8 mg/dL — ABNORMAL LOW (ref 8.9–10.3)
Calcium: 8.2 mg/dL — ABNORMAL LOW (ref 8.9–10.3)
Calcium: 8.5 mg/dL — ABNORMAL LOW (ref 8.9–10.3)
Chloride: 115 mmol/L — ABNORMAL HIGH (ref 101–111)
Chloride: 114 mmol/L — ABNORMAL HIGH (ref 101–111)
Chloride: 111 mmol/L (ref 101–111)
Creatinine, Ser: 0.72 mg/dL (ref 0.44–1.00)
Creatinine, Ser: 0.51 mg/dL (ref 0.44–1.00)
Creatinine, Ser: 0.51 mg/dL (ref 0.44–1.00)
Creatinine, Ser: 0.51 mg/dL (ref 0.44–1.00)
GFR calc Af Amer: >60 mL/min (ref >60)
GFR calc Af Amer: >60 mL/min (ref >60)
GFR calc Af Amer: >60 mL/min (ref >60)
GFR calc non Af Amer: >60 mL/min (ref >60)
GFR calc non Af Amer: >60 mL/min (ref >60)
GLUCOSE: 203 mg/dL — AB (ref 65–99)
GLUCOSE: 165 mg/dL — AB (ref 65–99)
GLUCOSE: 242 mg/dL — AB (ref 65–99)
Glucose, Bld: 191 mg/dL — ABNORMAL HIGH (ref 65–99)
Glucose, Bld: 137 mg/dL — ABNORMAL HIGH (ref 65–99)
POTASSIUM: 4.3 mmol/L (ref 3.5–5.1)
POTASSIUM: 3.1 mmol/L — AB (ref 3.5–5.1)
POTASSIUM: 4.4 mmol/L (ref 3.5–5.1)
POTASSIUM: 2.8 mmol/L — AB (ref 3.5–5.1)
Potassium: 3.4 mmol/L — ABNORMAL LOW (ref 3.5–5.1)
SODIUM: 135 mmol/L (ref 135–145)
SODIUM: 134 mmol/L — AB (ref 135–145)
Sodium: 134 mmol/L — ABNORMAL LOW (ref 135–145)
Sodium: 133 mmol/L — ABNORMAL LOW (ref 135–145)
Sodium: 133 mmol/L — ABNORMAL LOW (ref 135–145)

## 2016-06-04 LAB — GLUCOSE, CAPILLARY
GLUCOSE-CAPILLARY: 134 mg/dL — AB (ref 65–99)
GLUCOSE-CAPILLARY: 157 mg/dL — AB (ref 65–99)
GLUCOSE-CAPILLARY: 128 mg/dL — AB (ref 65–99)
GLUCOSE-CAPILLARY: 145 mg/dL — AB (ref 65–99)
GLUCOSE-CAPILLARY: 176 mg/dL — AB (ref 65–99)
GLUCOSE-CAPILLARY: 176 mg/dL — AB (ref 65–99)
GLUCOSE-CAPILLARY: 145 mg/dL — AB (ref 65–99)
GLUCOSE-CAPILLARY: 137 mg/dL — AB (ref 65–99)
Glucose-Capillary: 135 mg/dL — ABNORMAL HIGH (ref 65–99)
Glucose-Capillary: 141 mg/dL — ABNORMAL HIGH (ref 65–99)
Glucose-Capillary: 146 mg/dL — ABNORMAL HIGH (ref 65–99)
Glucose-Capillary: 162 mg/dL — ABNORMAL HIGH (ref 65–99)
Glucose-Capillary: 168 mg/dL — ABNORMAL HIGH (ref 65–99)
Glucose-Capillary: 170 mg/dL — ABNORMAL HIGH (ref 65–99)
Glucose-Capillary: 178 mg/dL — ABNORMAL HIGH (ref 65–99)
Glucose-Capillary: 179 mg/dL — ABNORMAL HIGH (ref 65–99)
Glucose-Capillary: 188 mg/dL — ABNORMAL HIGH (ref 65–99)
Glucose-Capillary: 188 mg/dL — ABNORMAL HIGH (ref 65–99)
Glucose-Capillary: 193 mg/dL — ABNORMAL HIGH (ref 65–99)
Glucose-Capillary: 207 mg/dL — ABNORMAL HIGH (ref 65–99)
Glucose-Capillary: 209 mg/dL — ABNORMAL HIGH (ref 65–99)
Glucose-Capillary: 220 mg/dL — ABNORMAL HIGH (ref 65–99)

## 2016-06-04 LAB — BLOOD GAS, ARTERIAL
Acid-base deficit: 13.2 mmol/L — ABNORMAL HIGH (ref 0.0–2.0)
Bicarbonate: 11.9 mmol/L — ABNORMAL LOW (ref 20.0–28.0)
DRAWN BY: 270211
FIO2: 0.21
O2 SAT: 98.2 %
PATIENT TEMPERATURE: 98.6
pCO2 arterial: 25.6 mmHg — ABNORMAL LOW (ref 32.0–48.0)
pH, Arterial: 7.287 — ABNORMAL LOW (ref 7.350–7.450)
pO2, Arterial: 107 mmHg (ref 83.0–108.0)

## 2016-06-04 LAB — CBC WITH DIFFERENTIAL/PLATELET
Basophils Absolute: 0 10*3/uL (ref 0.0–0.1)
Basophils Relative: 0 %
EOS ABS: 0.1 10*3/uL (ref 0.0–0.7)
Eosinophils Relative: 1 %
HEMATOCRIT: 33.3 % — AB (ref 36.0–46.0)
HEMOGLOBIN: 11.2 g/dL — AB (ref 12.0–15.0)
LYMPHS ABS: 1.6 10*3/uL (ref 0.7–4.0)
LYMPHS PCT: 15 %
MCH: 29.9 pg (ref 26.0–34.0)
MCHC: 33.6 g/dL (ref 30.0–36.0)
MCV: 88.8 fL (ref 78.0–100.0)
MONOS PCT: 13 %
Monocytes Absolute: 1.3 10*3/uL — ABNORMAL HIGH (ref 0.1–1.0)
NEUTROS ABS: 7.2 10*3/uL (ref 1.7–7.7)
NEUTROS PCT: 70 %
Platelets: 412 10*3/uL — ABNORMAL HIGH (ref 150–400)
RBC: 3.75 MIL/uL — ABNORMAL LOW (ref 3.87–5.11)
RDW: 13.5 % (ref 11.5–15.5)
WBC: 10.2 10*3/uL (ref 4.0–10.5)

## 2016-06-04 LAB — MRSA PCR SCREENING: MRSA BY PCR: NEGATIVE

## 2016-06-04 MED ORDER — SODIUM CHLORIDE 0.9 % IV BOLUS (SEPSIS)
1000.0000 mL | Freq: Once | INTRAVENOUS | Status: AC
  Administered 2016-06-04: 1000 mL via INTRAVENOUS

## 2016-06-04 MED ORDER — ACETAMINOPHEN 10 MG/ML IV SOLN: 1000.0000 mg | Freq: Four times a day (QID) | INTRAVENOUS | Status: DC

## 2016-06-04 MED ORDER — POTASSIUM CHLORIDE 10 MEQ/100ML IV SOLN
10.0000 meq | INTRAVENOUS | Status: AC
  Administered 2016-06-04 (×3): 10 meq via INTRAVENOUS
  Filled 2016-06-04: qty 100

## 2016-06-04 MED ORDER — ACETAMINOPHEN 500 MG PO TABS
1000.0000 mg | ORAL_TABLET | Freq: Four times a day (QID) | ORAL | Status: DC | PRN
Start: 2016-06-04 — End: 2016-06-06
  Administered 2016-06-04 – 2016-06-05 (×2): 1000 mg via ORAL
  Filled 2016-06-04 (×2): qty 2

## 2016-06-04 MED ORDER — SODIUM BICARBONATE 8.4 % IV SOLN
50.0000 meq | Freq: Once | INTRAVENOUS | Status: AC
  Administered 2016-06-04: 50 meq via INTRAVENOUS
  Filled 2016-06-04: qty 50

## 2016-06-04 MED ORDER — ACETAMINOPHEN 10 MG/ML IV SOLN
1000.0000 mg | Freq: Once | INTRAVENOUS | Status: AC
Start: 2016-06-04 — End: 2016-06-04
  Administered 2016-06-04: 1000 mg via INTRAVENOUS
  Filled 2016-06-04: qty 100

## 2016-06-04 MED ORDER — KETOROLAC TROMETHAMINE 15 MG/ML IJ SOLN
15.0000 mg | Freq: Four times a day (QID) | INTRAMUSCULAR | Status: DC | PRN
  Administered 2016-06-04 – 2016-06-06 (×5): 15 mg via INTRAVENOUS
  Filled 2016-06-04 (×5): qty 1

## 2016-06-04 MED ORDER — SODIUM CHLORIDE 0.9 % IV BOLUS (SEPSIS)
2000.0000 mL | Freq: Once | INTRAVENOUS | Status: AC
  Administered 2016-06-04: 2000 mL via INTRAVENOUS

## 2016-06-04 NOTE — Progress Notes (Signed)
Lab and IV team unable to obtain sample for 2200 BMET.  [facility] NP paged, order to attempt lab draw in feet received. Phlebotomist informed.

## 2016-06-04 NOTE — Progress Notes (Addendum)
PROGRESS NOTE [facility]   [name]   JQB:[id number] DOB: [birth year]  DOA: 05/31/2016 PCP: Vicenta Aly, FNP   Brief Narrative:  26 years old female with type 1 diabetes since age 57 with recurrent admissions for diabetes gastroparesis. Readmitted for intractable nausea and vomiting suspected gastroparesis flare. GI was consulted recommending NG tube placement and erythromycin.  Of note patient evidence of opiate dependence complaining of abdominal pain prior history of leaving the hospital against medical advice.  Patient now with DKA, transferred to the SDU for insulin ggt  Subjective: Patient seen and examined, feeling much better this am. Low output on NGT. On SDU for insulin ggt, was on DKA overnight. Abdominal pain improved. Afebrile. Denies N/V and diarrhea   Assessment & Plan: DKA - likely from dehydration  Anion gap closed  Continue glucose stabilizer per protocol Will start transition to subq insuline when bicarb > 15 Advance diet as tolerated pending bicarb  Monitor CBG and BMET  Keep in SDU for now   Hypokalemia due to above  Replete  Monitor   Intractable nausea and vomiting, likely multifactorial due to diabetes gastroparesis and cannabis hyperemesis syndrome. - improving  EGD in 2016 shows diffuse gastritis. Will advance diet as tolerated when DKA fully resolved  On IV erythromycin - transition to PO when able to get oral intake  GI recommendations appreciated D/c NGT  Continue IV fluids Continue IV Tylenol for pain control  Type 1 diabetes mellitus not well controlled Most recent A1c is 10, patient seem not compliant with medication Adjust insulin pending CBGs results See avobe   Bipolar Holding medications given no oral intake  Chronic diastolic CHF (Grade 1 diastolic dysfunction) Seems to be compensated On IV metoprolol, when necessary if heart rate of 120 and blood pressure of 160  DVT prophylaxis: Ambulation  Code Status:  FULL  Family Communication: None at bedside  Disposition Plan: Home when medically stable   Consultants:   GI - [facility]   Procedures:   None   Antimicrobials: Anti-infectives    Start     Dose/Rate Route Frequency Ordered Stop   06/02/16 1700  erythromycin (E-MYCIN) tablet 250 mg     250 mg Oral 3 times daily before meals 06/02/16 1627     06/02/16 1615  erythromycin 250 mg in sodium chloride 0.9 % 100 mL IVPB  Status:  Discontinued     250 mg 100 mL/hr over 60 Minutes Intravenous Every 8 hours 06/02/16 1602 06/02/16 1627       Objective: Vitals:   06/04/16 0200 06/04/16 0400 06/04/16 0600 06/04/16 0800  BP: (!) 159/109 (!) 164/112 (!) 161/102   Pulse: (!) 119 (!) 121 (!) 122   Resp: (!) 24 18 (!) 24   Temp:  98.3 F (36.8 C)  98.7 F (37.1 C)  TempSrc:    Oral  SpO2: 100% 100% 100%   Weight:      Height:        Intake/Output Summary (Last 24 hours) at 06/04/16 0843 Last data filed at 06/04/16 4097  Gross per 24 hour  Intake          5422.25 ml  Output             4625 ml  Net           797.25 ml   Filed Weights   05/31/16 2322 06/03/16 2140  Weight: 55.4 kg (122 lb 2.2 oz) 52.3 kg (115 lb 4.8 oz)    Examination:  General exam: NAD HEENT: NGT in place, low output  Respiratory system: CTA b/l  Cardiovascular system: S1S2 tachycardia, no murmurs  Gastrointestinal system: Abd soft NTND  Central nervous system: AAOx3 non focal  Extremities: No LE edema, symmetric  Skin: No lesions noted, multiple tattoos  Psychiatry: Mood appropriate, no hallucinations   Data Reviewed: I have personally reviewed following labs and imaging studies  CBC:  Recent Labs Lab 05/31/16 1544 06/01/16 0616 06/03/16 0857  WBC 10.0 8.8 13.0*  NEUTROABS  --  6.5  --   HGB 13.3 11.8* 14.2  HCT 40.3 35.6* 43.7  MCV 89.4 89.4 92.6  PLT 475* 407* 242*   Basic Metabolic Panel:  Recent Labs Lab 06/01/16 0427 06/03/16 0857 06/03/16 1859 06/04/16 0126 06/04/16 0642  NA 137  133* 136 134* 135  K 4.1 5.0 6.0* 4.3 3.1*  CL 104 105 114* 115* 114*  CO2 19* 7* <7* <7* 11*  GLUCOSE 280* 407* 323* 203* 165*  BUN 19 23* 22* 16 9  CREATININE 0.52 1.00 0.97 0.72 0.51  CALCIUM 8.9 9.3 9.0 8.4* 7.5*   GFR: Estimated Creatinine Clearance: 88 mL/min (by C-G formula based on SCr of 0.51 mg/dL). Liver Function Tests:  Recent Labs Lab 05/31/16 1544 06/01/16 0427  AST 19 14*  ALT 11* 10*  ALKPHOS 102 94  BILITOT 0.7 0.9  PROT 8.2* 7.0  ALBUMIN 4.5 3.7    Recent Labs Lab 05/31/16 1544  LIPASE 15   No results for input(s): AMMONIA in the last 168 hours. Coagulation Profile: No results for input(s): INR, PROTIME in the last 168 hours. Cardiac Enzymes: No results for input(s): CKTOTAL, CKMB, CKMBINDEX, TROPONINI in the last 168 hours. BNP (last 3 results) No results for input(s): PROBNP in the last 8760 hours. HbA1C: No results for input(s): HGBA1C in the last 72 hours. CBG:  Recent Labs Lab 06/04/16 0327 06/04/16 0435 06/04/16 0539 06/04/16 0635 06/04/16 0740  GLUCAP 168* 141* 134* 157* 128*   Lipid Profile: No results for input(s): CHOL, HDL, LDLCALC, TRIG, CHOLHDL, LDLDIRECT in the last 72 hours. Thyroid Function Tests: No results for input(s): TSH, T4TOTAL, FREET4, T3FREE, THYROIDAB in the last 72 hours. Anemia Panel: No results for input(s): VITAMINB12, FOLATE, FERRITIN, TIBC, IRON, RETICCTPCT in the last 72 hours. Sepsis Labs: No results for input(s): PROCALCITON, LATICACIDVEN in the last 168 hours.  Recent Results (from the past 240 hour(s))  MRSA PCR Screening     Status: None   Collection Time: 06/03/16 10:55 PM  Result Value Ref Range Status   MRSA by PCR NEGATIVE NEGATIVE Final    Comment:        The GeneXpert MRSA Assay (FDA approved for NASAL specimens only), is one component of a comprehensive MRSA colonization surveillance program. It is not intended to diagnose MRSA infection nor to guide or monitor treatment for MRSA  infections.       Radiology Studies: Dg Chest Port 1 View  Result Date: 06/03/2016 CLINICAL DATA:  Tachypnea and tachycardia. EXAM: PORTABLE CHEST 1 VIEW COMPARISON:  05/31/2016 and prior exams FINDINGS: The cardiomediastinal silhouette is unremarkable. There is no evidence of focal airspace disease, pulmonary edema, suspicious pulmonary nodule/mass, pleural effusion, or pneumothorax. No acute bony abnormalities are identified. An NG tube is identified with tip overlying the mid stomach. IMPRESSION: NG tube with tip overlying the mid stomach. No evidence of acute cardiopulmonary disease. Electronically Signed   By: Margarette Canada M.D.   On: 06/03/2016 20:49   Dg Abd Portable  1v  Result Date: 06/02/2016 CLINICAL DATA:  Evaluate NG tube placement EXAM: PORTABLE ABDOMEN - 1 VIEW COMPARISON:  May 31, 2016 FINDINGS: The side port of the NG tube terminates just below the GE junction. The distal tip is in the body of the stomach. IMPRESSION: NG tube placement as above. Electronically Signed   By: Dorise Bullion III M.D   On: 06/02/2016 18:44    Scheduled Meds: . erythromycin  250 mg Oral TID AC  . escitalopram  5 mg Oral Daily  . famotidine  20 mg Oral QHS  . metoCLOPramide (REGLAN) injection  10 mg Intravenous TID AC  . metoprolol tartrate  12.5 mg Oral BID  . mometasone-formoterol  2 puff Inhalation BID  . pantoprazole (PROTONIX) IV  40 mg Intravenous Q12H   Continuous Infusions: . dextrose 5 % and 0.45% NaCl 150 mL/hr at 06/04/16 0746  . insulin (NOVOLIN-R) infusion 0.7 Units/hr (06/04/16 0745)  . potassium chloride    . promethazine (PHENERGAN) IV infusion 12.5 mg (06/04/16 0235)     LOS: 3 days    Chipper Oman, MD Pager: Text Page via [email or web address]  [phone]  If 7PM-7AM, please contact night-coverage [email or web address] Password [facility] 06/04/2016, 8:43 AM

## 2016-06-04 NOTE — Progress Notes (Signed)
Date:  Jun 04, 2016  Chart reviewed for concurrent status and case management needs.  Will continue to follow patient progress. Transferred on 05162018/to sdu due to ska and need of insulin drip. Discharge Planning: following for needs  Expected discharge date: [id number]  Velva Harman, BSN, [place], [place]

## 2016-06-04 NOTE — Progress Notes (Signed)
[name] Progress Note  [name] 26 y.o. 1990-02-20   Subjective: Transferred to step down unit last evening due to need for insulin drip. Feels ok. Denies abdominal pain, N/V. NGT still in place with small amount of dark brown fluid in canister.  Objective: Vital signs in last 24 hours: Vitals:   06/04/16 0600 06/04/16 0800  BP: (!) 161/102   Pulse: (!) 122   Resp: (!) 24   Temp:  98.7 F (37.1 C)    Physical Exam: Gen: lethargic, thin, no acute distress CV: RRR Chest: CTA B Abd: soft, nontender, nondistended, +BS Ext: no edema  Lab Results:  Recent Labs  06/04/16 0126 06/04/16 0642  NA 134* 135  K 4.3 3.1*  CL 115* 114*  CO2 <7* 11*  GLUCOSE 203* 165*  BUN 16 9  CREATININE 0.72 0.51  CALCIUM 8.4* 7.5*   No results for input(s): AST, ALT, ALKPHOS, BILITOT, PROT, ALBUMIN in the last 72 hours.  Recent Labs  06/03/16 0857 06/04/16 0642  WBC 13.0* 10.2  NEUTROABS  --  7.2  HGB 14.2 11.2*  HCT 43.7 33.3*  MCV 92.6 88.8  PLT 431* 412*   No results for input(s): LABPROT, INR in the last 72 hours.    Assessment/Plan: Intractable N/V likely multifactorial from poorly controlled diabetes (diabetic gastroparesis) and chronic marijuana use - d/c NG tube; avoid narcotic pain meds; DKA management per primary teaml; bowel rest; will follow.   [place] C. 06/04/2016, 9:11 AM   Pager [phone]  AFTER 5 pm or on weekends call [name], female   DOB: [birth year], 26 y.o.   MRN: [id number]

## 2016-06-04 NOTE — Progress Notes (Signed)
Inpatient Diabetes Program Recommendations  AACE/ADA: New Consensus Statement on Inpatient Glycemic Control (2015)  Target Ranges:  Prepandial:   less than 140 mg/dL      Peak postprandial:   less than 180 mg/dL (1-2 hours)      Critically ill patients:  140 - 180 mg/dL   Lab Results  Component Value Date   GLUCAP 176 (H) 06/04/2016   HGBA1C 8.9 (H) 03/01/2016    Review of Glycemic Control  Back in DKA and transferred to ICU/SD for IV insulin drip. AG 10. CO2 11.  Not ready for transitioning to SQ insulin. No updated HgbA1C. Last one 03/02/2015 - 8.9%.  Inpatient Diabetes Program Recommendations:    When criteria met for transitioning to SQ insulin, give Lantus 2 hours prior to discontinuation of drip. Please order HgbA1C to assess home glycemic control  Transition recs: Lantus 12 units (give 2 hours prior to discontinuation of drip.) Novolog 0-9 units Q4H x 12H, then tidwc and hs Novolog 4 units tidwc for meal coverage insulin.  Spoke with pt this am and she stated she was still nauseated. Will follow closely.  Thank you. Lorenda Peck, RD, LDN, CDE Inpatient Diabetes Coordinator [phone]

## 2016-06-05 DIAGNOSIS — I10 Essential (primary) hypertension: Secondary | ICD-10-CM

## 2016-06-05 DIAGNOSIS — Z87898 Personal history of other specified conditions: Secondary | ICD-10-CM

## 2016-06-05 LAB — GLUCOSE, CAPILLARY
GLUCOSE-CAPILLARY: 121 mg/dL — AB (ref 65–99)
GLUCOSE-CAPILLARY: 130 mg/dL — AB (ref 65–99)
GLUCOSE-CAPILLARY: 135 mg/dL — AB (ref 65–99)
GLUCOSE-CAPILLARY: 156 mg/dL — AB (ref 65–99)
GLUCOSE-CAPILLARY: 161 mg/dL — AB (ref 65–99)
GLUCOSE-CAPILLARY: 204 mg/dL — AB (ref 65–99)
GLUCOSE-CAPILLARY: 213 mg/dL — AB (ref 65–99)
GLUCOSE-CAPILLARY: 290 mg/dL — AB (ref 65–99)
Glucose-Capillary: 154 mg/dL — ABNORMAL HIGH (ref 65–99)
Glucose-Capillary: 141 mg/dL — ABNORMAL HIGH (ref 65–99)
Glucose-Capillary: 122 mg/dL — ABNORMAL HIGH (ref 65–99)
Glucose-Capillary: 180 mg/dL — ABNORMAL HIGH (ref 65–99)
Glucose-Capillary: 189 mg/dL — ABNORMAL HIGH (ref 65–99)
Glucose-Capillary: 65 mg/dL (ref 65–99)

## 2016-06-05 LAB — MAGNESIUM: MAGNESIUM: 1.4 mg/dL — AB (ref 1.7–2.4)

## 2016-06-05 LAB — BASIC METABOLIC PANEL
ANION GAP: 12 (ref 5–15)
Anion gap: 6 (ref 5–15)
Anion gap: 6 (ref 5–15)
Anion gap: 10 (ref 5–15)
CALCIUM: 8.8 mg/dL — AB (ref 8.9–10.3)
CALCIUM: 8.7 mg/dL — AB (ref 8.9–10.3)
CHLORIDE: 110 mmol/L (ref 101–111)
CHLORIDE: 106 mmol/L (ref 101–111)
CHLORIDE: 108 mmol/L (ref 101–111)
CO2: 19 mmol/L — ABNORMAL LOW (ref 22–32)
CO2: 17 mmol/L — AB (ref 22–32)
CO2: 19 mmol/L — ABNORMAL LOW (ref 22–32)
CO2: 17 mmol/L — ABNORMAL LOW (ref 22–32)
CREATININE: 0.39 mg/dL — AB (ref 0.44–1.00)
CREATININE: 0.43 mg/dL — AB (ref 0.44–1.00)
CREATININE: 0.46 mg/dL (ref 0.44–1.00)
Calcium: 8.4 mg/dL — ABNORMAL LOW (ref 8.9–10.3)
Calcium: 8.8 mg/dL — ABNORMAL LOW (ref 8.9–10.3)
Chloride: 106 mmol/L (ref 101–111)
Creatinine, Ser: 0.51 mg/dL (ref 0.44–1.00)
GFR calc Af Amer: >60 mL/min (ref >60)
GFR calc Af Amer: >60 mL/min (ref >60)
GFR calc Af Amer: >60 mL/min (ref >60)
GFR calc non Af Amer: >60 mL/min (ref >60)
GFR calc non Af Amer: >60 mL/min (ref >60)
GLUCOSE: 141 mg/dL — AB (ref 65–99)
GLUCOSE: 216 mg/dL — AB (ref 65–99)
Glucose, Bld: 129 mg/dL — ABNORMAL HIGH (ref 65–99)
Glucose, Bld: 279 mg/dL — ABNORMAL HIGH (ref 65–99)
POTASSIUM: 3.4 mmol/L — AB (ref 3.5–5.1)
Potassium: 3.6 mmol/L (ref 3.5–5.1)
Potassium: 3 mmol/L — ABNORMAL LOW (ref 3.5–5.1)
Potassium: 3.6 mmol/L (ref 3.5–5.1)
SODIUM: 135 mmol/L (ref 135–145)
SODIUM: 131 mmol/L — AB (ref 135–145)
SODIUM: 135 mmol/L (ref 135–145)
Sodium: 135 mmol/L (ref 135–145)

## 2016-06-05 MED ORDER — DEXTROSE-NACL 5-0.9 % IV SOLN
INTRAVENOUS | Status: DC
  Administered 2016-06-05: 16:00:00 via INTRAVENOUS

## 2016-06-05 MED ORDER — INSULIN ASPART 100 UNIT/ML ~~LOC~~ SOLN
0.0000 [IU] | SUBCUTANEOUS | Status: DC
  Administered 2016-06-05: 8 [IU] via SUBCUTANEOUS
  Administered 2016-06-05: 3 [IU] via SUBCUTANEOUS
  Administered 2016-06-05: 5 [IU] via SUBCUTANEOUS
  Administered 2016-06-06: 8 [IU] via SUBCUTANEOUS
  Administered 2016-06-06: 5 [IU] via SUBCUTANEOUS
  Administered 2016-06-06 (×2): 3 [IU] via SUBCUTANEOUS
  Administered 2016-06-06: 2 [IU] via SUBCUTANEOUS

## 2016-06-05 MED ORDER — INSULIN ASPART 100 UNIT/ML ~~LOC~~ SOLN
3.0000 [IU] | Freq: Three times a day (TID) | SUBCUTANEOUS | Status: DC
  Administered 2016-06-06: 3 [IU] via SUBCUTANEOUS

## 2016-06-05 MED ORDER — POTASSIUM CHLORIDE 10 MEQ/100ML IV SOLN
10.0000 meq | INTRAVENOUS | Status: AC
  Administered 2016-06-05 (×3): 10 meq via INTRAVENOUS
  Filled 2016-06-05 (×3): qty 100

## 2016-06-05 MED ORDER — INSULIN GLARGINE 100 UNIT/ML ~~LOC~~ SOLN
28.0000 [IU] | Freq: Every day | SUBCUTANEOUS | Status: DC
Start: 2016-06-05 — End: 2016-06-05
  Filled 2016-06-05: qty 0.28

## 2016-06-05 MED ORDER — ACETAMINOPHEN 10 MG/ML IV SOLN
1000.0000 mg | Freq: Once | INTRAVENOUS | Status: AC
  Administered 2016-06-05: 1000 mg via INTRAVENOUS
  Filled 2016-06-05: qty 100

## 2016-06-05 MED ORDER — HYDRALAZINE HCL 20 MG/ML IJ SOLN
10.0000 mg | Freq: Four times a day (QID) | INTRAMUSCULAR | Status: DC | PRN
  Administered 2016-06-05 (×2): 10 mg via INTRAVENOUS
  Filled 2016-06-05 (×2): qty 1

## 2016-06-05 MED ORDER — POTASSIUM CHLORIDE 10 MEQ/100ML IV SOLN
10.0000 meq | INTRAVENOUS | Status: AC
  Administered 2016-06-05 (×6): 10 meq via INTRAVENOUS
  Filled 2016-06-05 (×6): qty 100

## 2016-06-05 MED ORDER — KETOROLAC TROMETHAMINE 15 MG/ML IJ SOLN
15.0000 mg | Freq: Once | INTRAMUSCULAR | Status: AC
  Administered 2016-06-05: 15 mg via INTRAVENOUS
  Filled 2016-06-05: qty 1

## 2016-06-05 MED ORDER — INSULIN GLARGINE 100 UNIT/ML ~~LOC~~ SOLN
28.0000 [IU] | Freq: Every day | SUBCUTANEOUS | Status: DC
  Administered 2016-06-05 – 2016-06-06 (×2): 28 [IU] via SUBCUTANEOUS
  Filled 2016-06-05 (×2): qty 0.28

## 2016-06-05 MED ORDER — PANTOPRAZOLE SODIUM 40 MG PO TBEC
40.0000 mg | DELAYED_RELEASE_TABLET | Freq: Every day | ORAL | Status: DC
  Administered 2016-06-06: 40 mg via ORAL
  Filled 2016-06-05: qty 1

## 2016-06-05 NOTE — Progress Notes (Signed)
Dr. Hal Hope paged about patient's potassium of 2.8. 59mEq potassium chloride IV x6 ordered.

## 2016-06-05 NOTE — Progress Notes (Signed)
[facility] Progress Note  Janace Decker 26 y.o. 1990/04/25   Subjective: Feels a lot better today. Denies N/V/abdominal pain. Denies BMs. NG removed yesterday. More alert.  Objective: Vital signs in last 24 hours: Vitals:   06/05/16 1150 06/05/16 1200  BP:  113/71  Pulse:  (!) 111  Resp:  17  Temp: 99.5 F (37.5 C)     Physical Exam: Gen: alert, no acute distress HEENT: anicteric sclera CV: RRR Chest: CTA B Abd: soft, nontender, nondistended, +BS   Lab Results:  Recent Labs  06/05/16 0300 06/05/16 0706  NA 135 135  K 3.4* 3.6  CL 110 106  CO2 19* 17*  GLUCOSE 141* 216*  BUN <5* <5*  CREATININE 0.39* 0.43*  CALCIUM 8.4* 8.8*  MG 1.4*  --    No results for input(s): AST, ALT, ALKPHOS, BILITOT, PROT, ALBUMIN in the last 72 hours.  Recent Labs  06/03/16 0857 06/04/16 0642  WBC 13.0* 10.2  NEUTROABS  --  7.2  HGB 14.2 11.2*  HCT 43.7 33.3*  MCV 92.6 88.8  PLT 431* 412*   No results for input(s): LABPROT, INR in the last 72 hours.    Assessment/Plan: N/V has resolved and is likely multifactorial (diabetic gastroparesis and chronic marijuana use). Changed to full liquids and advance as tolerated to carb modified diet. Continue supportive care. Aggressive glucose control per primary team. Will sign off. Call us back if questions.   [place] C. 06/05/2016, 12:16 PM   Pager [phone]  AFTER 5 pm or on weekends call [phone] ID: [name], female   DOB: [birth year], 26 y.o.   MRN: [id number]

## 2016-06-05 NOTE — Progress Notes (Signed)
Inpatient Diabetes Program Recommendations  AACE/ADA: New Consensus Statement on Inpatient Glycemic Control (2015)  Target Ranges:  Prepandial:   less than 140 mg/dL      Peak postprandial:   less than 180 mg/dL (1-2 hours)      Critically ill patients:  140 - 180 mg/dL   Lab Results  Component Value Date   GLUCAP 189 (H) 06/05/2016   HGBA1C 8.9 (H) 03/01/2016    Review of Glycemic Control  Transitioned off drip this am. Given Lantus 28 units prior to discontinuation of drip. On Novolog 0-15 units Q4H. AG - 12.  CO2 - 17. Up from 19 at 0300. To start CL diet. Over past 24H - approx 48 units of insulin per GlucoStabilizer given. HgbA1C pending.   Inpatient Diabetes Program Recommendations:    Needs BMET Q4H x 12H. Decrease Novolog to 0-9 units Q4H.  Pt states she is on 16 units of Basaglar QHS at home. Will likely need less basal insulin. When eating at least 50% meals, add Novolog 4 units tidwc for meal coverage insulin.  Continue to follow.  Thank you. Lorenda Peck, RD, LDN, CDE Inpatient Diabetes Coordinator [phone]

## 2016-06-05 NOTE — Progress Notes (Addendum)
PROGRESS NOTE [facility]   Laporche Martelle   CNO:[id number] DOB: December 13, 1990  DOA: 05/31/2016 PCP: Vicenta Aly, FNP   Brief Narrative:  26 years old female with type 1 diabetes since age 7 with recurrent admissions for diabetes gastroparesis. Readmitted for intractable nausea and vomiting suspected gastroparesis flare. GI was consulted recommending NG tube placement and erythromycin.  Of note patient evidence of opiate dependence complaining of abdominal pain prior history of leaving the hospital against medical advice.  Patient now with DKA, transferred to the SDU for insulin ggt  Subjective: Patient seen and examined, doing well, have no complaints this am. Not very interactive. Denies N/V, abd pain, chest pain and SOB. Afebrile, BP slight up this AM  Assessment & Plan: DKA - likely from dehydration  Anion gap closed  Transition to Sq Insulin will add Lantus, continue drip for 2 hrs after first dose, the can stop insuline ggt Advance diet start with clear continue as tolerated SSI  Continue to monitor CBG's  If stable can transfer to medsurg in the afternoon    Hypokalemia due to above  Continue to replete  Check BMP in AM   Intractable nausea and vomiting, likely multifactorial due to diabetes gastroparesis and cannabis hyperemesis syndrome. - improved  EGD in 2016 shows diffuse gastritis. Advancing diet to clear liquids  Continue erythromycin and Reglan   GI recommendations appreciated Stop IVF when tolerating diet  Continue tylenol or toradol for pain - AVOID NARCOTICS as decrease intestinal motility   Type 1 diabetes mellitus not well controlled Most recent A1c is 10, patient seem not compliant with medication Checking new A1C  Adjust insulin pending CBGs results See avobe   Bipolar Can resume medication once tolerating oral intake   Chronic diastolic CHF (Grade 1 diastolic dysfunction) Seems to be compensated Monitor for signs of fluid overload  as patient is on IVF  On BB   HTN - BP slight elevated could be related to aggressive hydration  On metoprolol continue for now  Add hydralazine PRN for SBP > 160 Monitor BP   Hx of urinary retention  Have foley cath in place  Will do void trial when patient more interactive   DVT prophylaxis: Ambulation, SCD's  Code Status: FULL  Family Communication: None at bedside  Disposition Plan: Home when medically stable   Consultants:   GI - [facility]   Procedures:   None   Antimicrobials: Anti-infectives    Start     Dose/Rate Route Frequency Ordered Stop   06/02/16 1700  erythromycin (E-MYCIN) tablet 250 mg     250 mg Oral 3 times daily before meals 06/02/16 1627     06/02/16 1615  erythromycin 250 mg in sodium chloride 0.9 % 100 mL IVPB  Status:  Discontinued     250 mg 100 mL/hr over 60 Minutes Intravenous Every 8 hours 06/02/16 1602 06/02/16 1627       Objective: Vitals:   06/05/16 0400 06/05/16 0600 06/05/16 0615 06/05/16 0716  BP: (!) 152/120 (!) 178/123  (!) 170/119  Pulse: (!) 130 (!) 123  (!) 103  Resp: 17 17  13   Temp:   98.6 F (37 C)   TempSrc:   Oral   SpO2: 100% 100%  100%  Weight:   56.9 kg (125 lb 7.1 oz)   Height:        Intake/Output Summary (Last 24 hours) at 06/05/16 0826 Last data filed at 06/05/16 2947  Gross per 24 hour  Intake  4414.29 ml  Output             3950 ml  Net           464.29 ml   Filed Weights   05/31/16 2322 06/03/16 2140 06/05/16 0615  Weight: 55.4 kg (122 lb 2.2 oz) 52.3 kg (115 lb 4.8 oz) 56.9 kg (125 lb 7.1 oz)    Examination:  General exam: Sleepy but responsive to verbal commands  Respiratory system: CTA bilaterally  Cardiovascular system: S1S2 tachy no m,r,g Gastrointestinal system: Abd soft NT ND +BS  Extremities: No edema  Skin: Multiple tattoos, no rashes noted  Psychiatry: Mood depressed   Data Reviewed: I have personally reviewed following labs and imaging studies  CBC:  Recent Labs Lab  05/31/16 1544 06/01/16 0616 06/03/16 0857 06/04/16 0642  WBC 10.0 8.8 13.0* 10.2  NEUTROABS  --  6.5  --  7.2  HGB 13.3 11.8* 14.2 11.2*  HCT 40.3 35.6* 43.7 33.3*  MCV 89.4 89.4 92.6 88.8  PLT 475* 407* 431* 893*   Basic Metabolic Panel:  Recent Labs Lab 06/04/16 1230 06/04/16 1729 06/04/16 2228 06/05/16 0300 06/05/16 0706  NA 133* 134* 133* 135 135  K 4.4 3.4* 2.8* 3.4* 3.6  CL 112* 111 108 110 106  CO2 13* 15* 15* 19* 17*  GLUCOSE 191* 137* 242* 141* 216*  BUN 6 <5* <5* <5* <5*  CREATININE 0.51 0.40* 0.51 0.39* 0.43*  CALCIUM 7.8* 8.2* 8.5* 8.4* 8.8*  MG  --   --   --  1.4*  --    GFR: Estimated Creatinine Clearance: 92 mL/min (A) (by C-G formula based on SCr of 0.43 mg/dL (L)). Liver Function Tests:  Recent Labs Lab 05/31/16 1544 06/01/16 0427  AST 19 14*  ALT 11* 10*  ALKPHOS 102 94  BILITOT 0.7 0.9  PROT 8.2* 7.0  ALBUMIN 4.5 3.7    Recent Labs Lab 05/31/16 1544  LIPASE 15   No results for input(s): AMMONIA in the last 168 hours. Coagulation Profile: No results for input(s): INR, PROTIME in the last 168 hours. Cardiac Enzymes: No results for input(s): CKTOTAL, CKMB, CKMBINDEX, TROPONINI in the last 168 hours. BNP (last 3 results) No results for input(s): PROBNP in the last 8760 hours. HbA1C: No results for input(s): HGBA1C in the last 72 hours. CBG:  Recent Labs Lab 06/05/16 0315 06/05/16 0431 06/05/16 0530 06/05/16 0631 06/05/16 0737  GLUCAP 130* 141* 122* 180* 213*   Lipid Profile: No results for input(s): CHOL, HDL, LDLCALC, TRIG, CHOLHDL, LDLDIRECT in the last 72 hours. Thyroid Function Tests: No results for input(s): TSH, T4TOTAL, FREET4, T3FREE, THYROIDAB in the last 72 hours. Anemia Panel: No results for input(s): VITAMINB12, FOLATE, FERRITIN, TIBC, IRON, RETICCTPCT in the last 72 hours. Sepsis Labs: No results for input(s): PROCALCITON, LATICACIDVEN in the last 168 hours.  Recent Results (from the past 240 hour(s))  MRSA  PCR Screening     Status: None   Collection Time: 06/03/16 10:55 PM  Result Value Ref Range Status   MRSA by PCR NEGATIVE NEGATIVE Final    Comment:        The GeneXpert MRSA Assay (FDA approved for NASAL specimens only), is one component of a comprehensive MRSA colonization surveillance program. It is not intended to diagnose MRSA infection nor to guide or monitor treatment for MRSA infections.       Radiology Studies: Dg Chest Port 1 View  Result Date: 06/03/2016 CLINICAL DATA:  Tachypnea and tachycardia. EXAM: PORTABLE  CHEST 1 VIEW COMPARISON:  05/31/2016 and prior exams FINDINGS: The cardiomediastinal silhouette is unremarkable. There is no evidence of focal airspace disease, pulmonary edema, suspicious pulmonary nodule/mass, pleural effusion, or pneumothorax. No acute bony abnormalities are identified. An NG tube is identified with tip overlying the mid stomach. IMPRESSION: NG tube with tip overlying the mid stomach. No evidence of acute cardiopulmonary disease. Electronically Signed   By: Margarette Canada M.D.   On: 06/03/2016 20:49    Scheduled Meds: . erythromycin  250 mg Oral TID AC  . escitalopram  5 mg Oral Daily  . famotidine  20 mg Oral QHS  . insulin aspart  0-15 Units Subcutaneous Q4H  . insulin glargine  28 Units Subcutaneous Daily  . metoCLOPramide (REGLAN) injection  10 mg Intravenous TID AC  . metoprolol tartrate  12.5 mg Oral BID  . mometasone-formoterol  2 puff Inhalation BID  . pantoprazole (PROTONIX) IV  40 mg Intravenous Q12H   Continuous Infusions: . dextrose 5 % and 0.45% NaCl 75 mL/hr at 06/05/16 0600  . insulin (NOVOLIN-R) infusion 3.1 Units/hr (06/05/16 0740)  . promethazine (PHENERGAN) IV infusion 12.5 mg (06/05/16 0340)     LOS: 4 days    Chipper Oman, MD Pager: Text Page via [email or web address]  [phone]  If 7PM-7AM, please contact night-coverage [email or web address] Password [facility] 06/05/2016, 8:26 AM

## 2016-06-06 LAB — GLUCOSE, CAPILLARY
GLUCOSE-CAPILLARY: 269 mg/dL — AB (ref 65–99)
Glucose-Capillary: 146 mg/dL — ABNORMAL HIGH (ref 65–99)
Glucose-Capillary: 170 mg/dL — ABNORMAL HIGH (ref 65–99)
Glucose-Capillary: 193 mg/dL — ABNORMAL HIGH (ref 65–99)
Glucose-Capillary: 216 mg/dL — ABNORMAL HIGH (ref 65–99)

## 2016-06-06 LAB — BASIC METABOLIC PANEL
ANION GAP: 11 (ref 5–15)
Anion gap: 12 (ref 5–15)
BUN: <5 mg/dL — ABNORMAL LOW (ref 6–20)
BUN: <5 mg/dL — ABNORMAL LOW (ref 6–20)
CALCIUM: 8.7 mg/dL — AB (ref 8.9–10.3)
CHLORIDE: 106 mmol/L (ref 101–111)
CHLORIDE: 107 mmol/L (ref 101–111)
CO2: 16 mmol/L — AB (ref 22–32)
CO2: 19 mmol/L — AB (ref 22–32)
CREATININE: 0.55 mg/dL (ref 0.44–1.00)
Calcium: 8.8 mg/dL — ABNORMAL LOW (ref 8.9–10.3)
Creatinine, Ser: 0.57 mg/dL (ref 0.44–1.00)
GFR calc Af Amer: >60 mL/min (ref >60)
GFR calc non Af Amer: >60 mL/min (ref >60)
GFR calc non Af Amer: >60 mL/min (ref >60)
Glucose, Bld: 207 mg/dL — ABNORMAL HIGH (ref 65–99)
Glucose, Bld: 278 mg/dL — ABNORMAL HIGH (ref 65–99)
POTASSIUM: 3.5 mmol/L (ref 3.5–5.1)
Potassium: 3.6 mmol/L (ref 3.5–5.1)
SODIUM: 134 mmol/L — AB (ref 135–145)
SODIUM: 137 mmol/L (ref 135–145)

## 2016-06-06 LAB — HEMOGLOBIN A1C
HEMOGLOBIN A1C: 8.3 % — AB (ref 4.8–5.6)
Mean Plasma Glucose: 192 mg/dL

## 2016-06-06 MED ORDER — METOPROLOL TARTRATE 25 MG PO TABS: 25.0000 mg | ORAL_TABLET | Freq: Two times a day (BID) | ORAL | Status: DC

## 2016-06-06 MED ORDER — PROMETHAZINE HCL 25 MG/ML IJ SOLN
12.5000 mg | Freq: Once | INTRAMUSCULAR | Status: AC
  Administered 2016-06-06: 12.5 mg via INTRAVENOUS
  Filled 2016-06-06: qty 1

## 2016-06-06 MED ORDER — METOPROLOL TARTRATE 5 MG/5ML IV SOLN
5.0000 mg | Freq: Once | INTRAVENOUS | Status: AC
  Administered 2016-06-06: 5 mg via INTRAVENOUS
  Filled 2016-06-06: qty 5

## 2016-06-06 NOTE — Progress Notes (Signed)
PROGRESS NOTE [facility]   [name]   EVO:[id number] DOB: 09-01-1990  DOA: 05/31/2016 PCP: Vicenta Aly, FNP   Brief Narrative:  26 years old female with type 1 diabetes since age 78 with recurrent admissions for diabetes gastroparesis. Readmitted for intractable nausea and vomiting suspected gastroparesis flare. GI was consulted recommending NG tube placement and erythromycin.  Of note patient evidence of opiate dependence complaining of abdominal pain prior history of leaving the hospital against medical advice.  Patient now with DKA, transferred to the SDU for insulin ggt  Subjective: Patient seen and examined in SDU, continues to improve, awake and interactive. Patient had a mild episode of nausea and vomiting last night, better this morning. Glucose more stable   Assessment & Plan: DKA - likely from dehydration  Anion gap closed  Treated with Insulin drip - transitioned to sq basal insulin and meal coverage If tolerate diet and no more N/V can be discharge in PM or in AM  Continue SSI and Monitor CBG's  Transfer to to medsurg     Hypokalemia - resolved   Intractable nausea and vomiting, likely multifactorial due to diabetes gastroparesis and cannabis hyperemesis syndrome. - improved  EGD in 2016 shows diffuse gastritis. Advancing diet to clear liquids  Continue erythromycin and Reglan   GI recommendations appreciated - signed off  Continue tylenol or toradol for pain - AVOID NARCOTICS as decrease intestinal motility  Tolerating diet fair   Type 1 diabetes mellitus not well controlled A1C 8.3 on 06/05/16 Will continue to adjust insulin pending CBG's will likely need 20-24 units of Lantus daily plus SSI with meals See avobe   Bipolar Resume home medications   Chronic diastolic CHF (Grade 1 diastolic dysfunction) Seems to be compensated Monitor for signs of fluid overload as patient is on IVF  On BB   HTN - BP slight above goal  Give she is also  tachycardic will increase Metoprolol to 25 mg BID  Continue hydralazine PRN for SBP > 160 Monitor BP   Hx of urinary retention  She does intermittent in and out cath at home  Foley removed yesterday   DVT prophylaxis: Ambulation, SCD's  Code Status: FULL  Family Communication: None at bedside  Disposition Plan: Home in next 24 hrs   Consultants:   GI - [facility]   Procedures:   None   Antimicrobials: Anti-infectives    Start     Dose/Rate Route Frequency Ordered Stop   06/02/16 1700  erythromycin (E-MYCIN) tablet 250 mg     250 mg Oral 3 times daily before meals 06/02/16 1627     06/02/16 1615  erythromycin 250 mg in sodium chloride 0.9 % 100 mL IVPB  Status:  Discontinued     250 mg 100 mL/hr over 60 Minutes Intravenous Every 8 hours 06/02/16 1602 06/02/16 1627       Objective: Vitals:   06/06/16 0004 06/06/16 0223 06/06/16 0445 06/06/16 0628  BP:  (!) 159/102 113/61   Pulse:  (!) 138 (!) 118   Resp:  11 (!) 21   Temp: 98.9 F (37.2 C)   98.9 F (37.2 C)  TempSrc: Oral   Oral  SpO2:  100% 99%   Weight:      Height:        Intake/Output Summary (Last 24 hours) at 06/06/16 0945 Last data filed at 06/06/16 0600  Gross per 24 hour  Intake          2755.59 ml  Output  1400 ml  Net          1355.59 ml   Filed Weights   05/31/16 2322 06/03/16 2140 06/05/16 0615  Weight: 55.4 kg (122 lb 2.2 oz) 52.3 kg (115 lb 4.8 oz) 56.9 kg (125 lb 7.1 oz)    Examination:  General exam: NAD, calm and comfortable  Respiratory system: Clear to auscultation  Cardiovascular system: S1S2, tachy no murmurs  Gastrointestinal system: Abd soft NT ND Extremities: No LE edema   Psychiatry: Mood depressed   Data Reviewed: I have personally reviewed following labs and imaging studies  CBC:  Recent Labs Lab 05/31/16 1544 06/01/16 0616 06/03/16 0857 06/04/16 0642  WBC 10.0 8.8 13.0* 10.2  NEUTROABS  --  6.5  --  7.2  HGB 13.3 11.8* 14.2 11.2*  HCT 40.3 35.6* 43.7  33.3*  MCV 89.4 89.4 92.6 88.8  PLT 475* 407* 431* 782*   Basic Metabolic Panel:  Recent Labs Lab 06/05/16 0300 06/05/16 0706 06/05/16 1208 06/05/16 2021 06/06/16 0147 06/06/16 0355  NA 135 135 131* 135 134* 137  K 3.4* 3.6 3.0* 3.6 3.5 3.6  CL 110 106 106 108 106 107  CO2 19* 17* 19* 17* 16* 19*  GLUCOSE 141* 216* 129* 279* 207* 278*  BUN <5* <5* <5* <5* <5* <5*  CREATININE 0.39* 0.43* 0.46 0.51 0.57 0.55  CALCIUM 8.4* 8.8* 8.7* 8.8* 8.8* 8.7*  MG 1.4*  --   --   --   --   --    GFR: Estimated Creatinine Clearance: 92 mL/min (by C-G formula based on SCr of 0.55 mg/dL). Liver Function Tests:  Recent Labs Lab 05/31/16 1544 06/01/16 0427  AST 19 14*  ALT 11* 10*  ALKPHOS 102 94  BILITOT 0.7 0.9  PROT 8.2* 7.0  ALBUMIN 4.5 3.7    Recent Labs Lab 05/31/16 1544  LIPASE 15   No results for input(s): AMMONIA in the last 168 hours. Coagulation Profile: No results for input(s): INR, PROTIME in the last 168 hours. Cardiac Enzymes: No results for input(s): CKTOTAL, CKMB, CKMBINDEX, TROPONINI in the last 168 hours. BNP (last 3 results) No results for input(s): PROBNP in the last 8760 hours. HbA1C:  Recent Labs  06/05/16 0706  HGBA1C 8.3*   CBG:  Recent Labs Lab 06/05/16 1811 06/05/16 2003 06/05/16 2358 06/06/16 0412 06/06/16 0926  GLUCAP 121* 290* 216* 269* 193*   Lipid Profile: No results for input(s): CHOL, HDL, LDLCALC, TRIG, CHOLHDL, LDLDIRECT in the last 72 hours. Thyroid Function Tests: No results for input(s): TSH, T4TOTAL, FREET4, T3FREE, THYROIDAB in the last 72 hours. Anemia Panel: No results for input(s): VITAMINB12, FOLATE, FERRITIN, TIBC, IRON, RETICCTPCT in the last 72 hours. Sepsis Labs: No results for input(s): PROCALCITON, LATICACIDVEN in the last 168 hours.  Recent Results (from the past 240 hour(s))  MRSA PCR Screening     Status: None   Collection Time: 06/03/16 10:55 PM  Result Value Ref Range Status   MRSA by PCR NEGATIVE  NEGATIVE Final    Comment:        The GeneXpert MRSA Assay (FDA approved for NASAL specimens only), is one component of a comprehensive MRSA colonization surveillance program. It is not intended to diagnose MRSA infection nor to guide or monitor treatment for MRSA infections.      Radiology Studies: No results found.  Scheduled Meds: . erythromycin  250 mg Oral TID AC  . escitalopram  5 mg Oral Daily  . famotidine  20 mg Oral QHS  .  insulin aspart  0-15 Units Subcutaneous Q4H  . insulin aspart  3 Units Subcutaneous TID WC  . insulin glargine  28 Units Subcutaneous Daily  . metoCLOPramide (REGLAN) injection  10 mg Intravenous TID AC  . metoprolol tartrate  12.5 mg Oral BID  . mometasone-formoterol  2 puff Inhalation BID  . pantoprazole  40 mg Oral Daily   Continuous Infusions: . dextrose 5 % and 0.9% NaCl 75 mL/hr at 06/06/16 0600  . promethazine (PHENERGAN) IV infusion 12.5 mg (06/05/16 2038)     LOS: 5 days    Chipper Oman, MD Pager: Text Page via [email or web address]  [phone]  If 7PM-7AM, please contact night-coverage [email or web address] Password [email or web address] 06/06/2016, 9:45 AM

## 2016-06-08 NOTE — Discharge Summary (Signed)
Physician Discharge Summary  [name]  NFA:[id number]  DOB: July 16, 1990  DOA: 05/31/2016 PCP: Vicenta Aly, FNP  Admit date: 05/31/2016 Discharge date: 06/08/2016  Admitted From: Home  Disposition:  Home   Recommendations for Outpatient Follow-up:  1. Follow up with PCP in 1 week 2. Follow up with Endocrinologist  3. AVOID NARCOTICS as decrease intestinal motility and worsen gastroparesis   Home Health: None Equipment/Devices: None  Discharge Condition: Stable  CODE STATUS: FULL  Diet recommendation: Carb Modified   Brief/Interim Summary: 26 years old female with type 1 diabetes since age 64, with recurrent admissions for diabetes gastroparesis. Readmitted for intractable nausea and vomiting due gastroparesis flare. GI was consulted recommending NG tube placement and IV erythromycin. After NGT was placed patient developed DKA and was trasnferred to the SDU for insulin ggt. Patient glucose was stabilized and subsequently was transitioned to Basal and meal coverage insulin. Upon glucose stabilization patient's nausea and vomiting resolved. Patient diet was advance and she tolerated well. Patient improved clinically, N/V subside, remained afebrile, and was ambulatory with no issues. Patient was deemed stable for discharge   Discharge Diagnoses/Hospital Course:  DKA - likely from dehydration  Anion gap closed  Treated with Insulin drip - transitioned to sq basal insulin and meal coverage Continue to monitor finger capillary glucose goal < 140 fasting    Hypokalemia - resolved   Intractable nausea and vomiting, likely multifactorial due to diabetes gastroparesis and cannabis hyperemesis syndrome. - resolved   EGD in 2016 shows diffuse gastritis. Treated Erythromycin and Reglan TID - was discharge on Reglan TID  Recommending small frequent meals, low fiber meals.  AVOID NARCOTICS as decrease intestinal motility  Tolerating diet well upon discharge   Type 1 diabetes  mellitus not well controlled A1C 8.3 on 06/05/16 Patient was discharge on her current insulin regime - no changes on doses were made Follow up with Endo she may benefit from insulin pump   Bipolar Resume home medications   Chronic diastolic CHF (Grade 1 diastolic dysfunction) Compensated during hospital stay  No changes in medications were made   HTN - BP was above goal during hospital stay  Her metoprolol was increased to 25 mg BID to help with sinus tachycardia as well Follow up with PCP to monitor BP and HR   Hx of urinary retention  She does intermittent in and out cath at home  She had a foley cath placed during hospital stay which was removed < 48 hrs  All other chronic medical condition were stable during the hospitalization.  On the day of the discharge the patient's vitals were stable, and no other acute medical condition were reported by patient. Patient was felt safe to be discharge to home  Discharge Instructions  You were cared for by a hospitalist during your hospital stay. If you have any questions about your discharge medications or the care you received while you were in the hospital after you are discharged, you can call the unit and asked to speak with the hospitalist on call if the hospitalist that took care of you is not available. Once you are discharged, your primary care physician will handle any further medical issues. Please note that NO REFILLS for any discharge medications will be authorized once you are discharged, as it is imperative that you return to your primary care physician (or establish a relationship with a primary care physician if you do not have one) for your aftercare needs so that they can reassess your need for  medications and monitor your lab values.  Discharge Instructions    (HEART FAILURE PATIENTS) Call MD:  Anytime you have any of the following symptoms: 1) 3 pound weight gain in 24 hours or 5 pounds in 1 week 2) shortness of breath, with  or without a dry hacking cough 3) swelling in the hands, feet or stomach 4) if you have to sleep on extra pillows at night in order to breathe.    Complete by:  As directed    Call MD for:  difficulty breathing, headache or visual disturbances    Complete by:  As directed    Call MD for:  extreme fatigue    Complete by:  As directed    Call MD for:  hives    Complete by:  As directed    Call MD for:  persistant dizziness or light-headedness    Complete by:  As directed    Call MD for:  persistant nausea and vomiting    Complete by:  As directed    Call MD for:  redness, tenderness, or signs of infection (pain, swelling, redness, odor or green/yellow discharge around incision site)    Complete by:  As directed    Call MD for:  severe uncontrolled pain    Complete by:  As directed    Call MD for:  temperature >100.4    Complete by:  As directed    Diet - low sodium heart healthy    Complete by:  As directed    Increase activity slowly    Complete by:  As directed      Allergies as of 06/06/2016      Reactions   Peanut-containing Drug Products Swelling, Other (See Comments)   Reaction:  Swelling of mouth and lips    Food Swelling, Other (See Comments)   Pt is allergic to strawberries.   Reaction:  Swelling of mouth and lips    Ultram [tramadol] Itching      Medication List    TAKE these medications   albuterol 108 (90 Base) MCG/ACT inhaler Commonly known as:  PROVENTIL HFA;VENTOLIN HFA Inhale 2 puffs into the lungs every 6 (six) hours as needed for wheezing or shortness of breath.   albuterol (2.5 MG/3ML) 0.083% nebulizer solution Commonly known as:  PROVENTIL Take 2.5 mg by nebulization every 6 (six) hours as needed for wheezing or shortness of breath.   BASAGLAR KWIKPEN 100 UNIT/ML Sopn Inject 0.28 mLs (28 Units total) into the skin at bedtime.   budesonide-formoterol 160-4.5 MCG/ACT inhaler Commonly known as:  SYMBICORT Inhale 2 puffs into the lungs 2 (two) times  daily.   escitalopram 10 MG tablet Commonly known as:  LEXAPRO Take 0.5 tablets (5 mg total) by mouth daily.   famotidine 20 MG tablet Commonly known as:  PEPCID Take 1 tablet (20 mg total) by mouth at bedtime.   ketorolac 10 MG tablet Commonly known as:  TORADOL Take 1 tablet (10 mg total) by mouth every 6 (six) hours as needed.   metoCLOPramide 10 MG tablet Commonly known as:  REGLAN Take 1 tablet (10 mg total) by mouth 3 (three) times daily before meals.   metoprolol tartrate 25 MG tablet Commonly known as:  LOPRESSOR Take 0.5 tablets (12.5 mg total) by mouth 2 (two) times daily.   NOVOLOG FLEXPEN 100 UNIT/ML FlexPen Generic drug:  insulin aspart Inject into the skin See admin instructions. Pt uses 1 unit for every 10 grams of carbs with meals and snacks for  BS greater than 50.   pantoprazole 40 MG tablet Commonly known as:  PROTONIX Take 1 tablet (40 mg total) by mouth 2 (two) times daily.   promethazine 25 MG tablet Commonly known as:  PHENERGAN Take 1 tablet (25 mg total) by mouth every 6 (six) hours as needed for nausea or vomiting.   Vitamin D (Ergocalciferol) 50000 units Caps capsule Commonly known as:  DRISDOL Take 50,000 Units by mouth once a week.       Allergies  Allergen Reactions  . Peanut-Containing Drug Products Swelling and Other (See Comments)    Reaction:  Swelling of mouth and lips   . 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   Reaction:  Swelling of mouth and lips   . Ultram [Tramadol] Itching    Consultations:  GI - [facility]    Procedures/Studies: Dg Chest Port 1 View  Result Date: 06/03/2016 CLINICAL DATA:  Tachypnea and tachycardia. EXAM: PORTABLE CHEST 1 VIEW COMPARISON:  05/31/2016 and prior exams FINDINGS: The cardiomediastinal silhouette is unremarkable. There is no evidence of focal airspace disease, pulmonary edema, suspicious pulmonary nodule/mass, pleural effusion, or pneumothorax. No acute bony  abnormalities are identified. An NG tube is identified with tip overlying the mid stomach. IMPRESSION: NG tube with tip overlying the mid stomach. No evidence of acute cardiopulmonary disease. Electronically Signed   By: Margarette Canada M.D.   On: 06/03/2016 20:49   Portable Chest X-ray (1 View)  Result Date: 05/14/2016 CLINICAL DATA:  DKA.  Shortness of breath . EXAM: PORTABLE CHEST 1 VIEW COMPARISON:  05/04/2016 . FINDINGS: Mediastinum and hilar structures are normal. Heart size stable. Lungs are clear. No pleural effusion or pneumothorax . IMPRESSION: No acute abnormality. Electronically Signed   By: Marcello Moores  Register   On: 05/14/2016 07:20   Dg Abd Acute W/chest  Result Date: 06/01/2016 CLINICAL DATA:  Nausea and vomiting. EXAM: DG ABDOMEN ACUTE W/ 1V CHEST COMPARISON:  Chest radiograph 05/14/2016, abdominal CT 02/20/2016 FINDINGS: Lower lung volumes from prior exam accentuating cardiac size. No consolidation or pleural fluid. No evidence pulmonary edema. Normal bowel gas pattern. No dilated bowel loops to suggest obstruction. Small to moderate stool burden. No evidence of free air. Cholecystectomy clips in the right upper quadrant. No radiopaque calculi. No osseous abnormalities. IMPRESSION: Normal bowel gas pattern.  No evidence of obstruction or free air. Low lung volumes without acute abnormality. Electronically Signed   By: Jeb Levering M.D.   On: 06/01/2016 00:32   Dg Abd Portable 1v  Result Date: 06/02/2016 CLINICAL DATA:  Evaluate NG tube placement EXAM: PORTABLE ABDOMEN - 1 VIEW COMPARISON:  May 31, 2016 FINDINGS: The side port of the NG tube terminates just below the GE junction. The distal tip is in the body of the stomach. IMPRESSION: NG tube placement as above. Electronically Signed   By: Dorise Bullion III M.D   On: 06/02/2016 18:44      Discharge Exam: Vitals:   06/06/16 1000 06/06/16 1100  BP:  107/65  Pulse: (!) 123 (!) 111  Resp: 17 16  Temp:  98.6 F (37 C)   Vitals:    06/06/16 0800 06/06/16 0900 06/06/16 1000 06/06/16 1100  BP: (!) 112/59   107/65  Pulse: (!) 122 (!) 122 (!) 123 (!) 111  Resp: 18 18 17 16   Temp:    98.6 F (37 C)  TempSrc:    Oral  SpO2: 99% 100% 100% 100%  Weight:  Height:       General: Pt is alert, awake, not in acute distress Cardiovascular: Tachy @ 106, S1/S2 +, no rubs, no gallops Respiratory: CTA bilaterally, no wheezing, no rhonchi Abdominal: Soft, NT, ND, bowel sounds + Extremities: no edema, no cyanosis   The results of significant diagnostics from this hospitalization (including imaging, microbiology, ancillary and laboratory) are listed below for reference.     Microbiology: Recent Results (from the past 240 hour(s))  MRSA PCR Screening     Status: None   Collection Time: 06/03/16 10:55 PM  Result Value Ref Range Status   MRSA by PCR NEGATIVE NEGATIVE Final    Comment:        The GeneXpert MRSA Assay (FDA approved for NASAL specimens only), is one component of a comprehensive MRSA colonization surveillance program. It is not intended to diagnose MRSA infection nor to guide or monitor treatment for MRSA infections.      Labs: BNP (last 3 results)  Recent Labs  07/11/15 0353  BNP 6.9   Basic Metabolic Panel:  Recent Labs Lab 06/05/16 0300 06/05/16 0706 06/05/16 1208 06/05/16 2021 06/06/16 0147 06/06/16 0355  NA 135 135 131* 135 134* 137  K 3.4* 3.6 3.0* 3.6 3.5 3.6  CL 110 106 106 108 106 107  CO2 19* 17* 19* 17* 16* 19*  GLUCOSE 141* 216* 129* 279* 207* 278*  BUN <5* <5* <5* <5* <5* <5*  CREATININE 0.39* 0.43* 0.46 0.51 0.57 0.55  CALCIUM 8.4* 8.8* 8.7* 8.8* 8.8* 8.7*  MG 1.4*  --   --   --   --   --    Liver Function Tests: No results for input(s): AST, ALT, ALKPHOS, BILITOT, PROT, ALBUMIN in the last 168 hours. No results for input(s): LIPASE, AMYLASE in the last 168 hours. No results for input(s): AMMONIA in the last 168 hours. CBC:  Recent Labs Lab 06/03/16 0857  06/04/16 0642  WBC 13.0* 10.2  NEUTROABS  --  7.2  HGB 14.2 11.2*  HCT 43.7 33.3*  MCV 92.6 88.8  PLT 431* 412*   Cardiac Enzymes: No results for input(s): CKTOTAL, CKMB, CKMBINDEX, TROPONINI in the last 168 hours. BNP: Invalid input(s): POCBNP CBG:  Recent Labs Lab 06/05/16 2358 06/06/16 0412 06/06/16 0926 06/06/16 1151 06/06/16 1629  GLUCAP 216* 269* 193* 170* 146*   D-Dimer No results for input(s): DDIMER in the last 72 hours. Hgb A1c No results for input(s): HGBA1C in the last 72 hours. Lipid Profile No results for input(s): CHOL, HDL, LDLCALC, TRIG, CHOLHDL, LDLDIRECT in the last 72 hours. Thyroid function studies No results for input(s): TSH, T4TOTAL, T3FREE, THYROIDAB in the last 72 hours.  Invalid input(s): FREET3 Anemia work up No results for input(s): VITAMINB12, FOLATE, FERRITIN, TIBC, IRON, RETICCTPCT in the last 72 hours. Urinalysis    Component Value Date/Time   COLORURINE STRAW (A) 06/03/2016 1913   APPEARANCEUR CLEAR 06/03/2016 1913   LABSPEC 1.015 06/03/2016 1913   PHURINE 5.0 06/03/2016 1913   GLUCOSEU >=500 (A) 06/03/2016 1913   GLUCOSEU >=1000 11/07/2012 1205   HGBUR NEGATIVE 06/03/2016 1913   BILIRUBINUR NEGATIVE 06/03/2016 1913   KETONESUR 80 (A) 06/03/2016 1913   PROTEINUR 30 (A) 06/03/2016 1913   UROBILINOGEN 0.2 11/24/2014 1045   NITRITE NEGATIVE 06/03/2016 1913   LEUKOCYTESUR TRACE (A) 06/03/2016 1913   Sepsis Labs Invalid input(s): PROCALCITONIN,  WBC,  LACTICIDVEN Microbiology Recent Results (from the past 240 hour(s))  MRSA PCR Screening     Status: None  Collection Time: 06/03/16 10:55 PM  Result Value Ref Range Status   MRSA by PCR NEGATIVE NEGATIVE Final    Comment:        The GeneXpert MRSA Assay (FDA approved for NASAL specimens only), is one component of a comprehensive MRSA colonization surveillance program. It is not intended to diagnose MRSA infection nor to guide or monitor treatment for MRSA infections.       Time coordinating discharge: 30 minutes  SIGNED:  Chipper Oman, MD  [facility] 06/08/2016, 4:05 PM  Pager please text page via  [email or web address] Password [email or web address]

## 2016-06-12 ENCOUNTER — Inpatient Hospital Stay (HOSPITAL_COMMUNITY)
Admission: EM | Admit: 2016-06-12 | Discharge: 2016-06-14 | DRG: 638 | Disposition: A | Discharge status: Home / Self Care | Payer: Medicaid Other | Attending: Internal Medicine | Admitting: Internal Medicine

## 2016-06-12 ENCOUNTER — Encounter (HOSPITAL_COMMUNITY): Payer: Self-pay | Admitting: Nurse Practitioner

## 2016-06-12 DIAGNOSIS — E1043 Type 1 diabetes mellitus with diabetic autonomic (poly)neuropathy: Secondary | ICD-10-CM | POA: Diagnosis present

## 2016-06-12 DIAGNOSIS — J45909 Unspecified asthma, uncomplicated: Secondary | ICD-10-CM | POA: Diagnosis present

## 2016-06-12 DIAGNOSIS — Z794 Long term (current) use of insulin: Secondary | ICD-10-CM | POA: Diagnosis not present

## 2016-06-12 DIAGNOSIS — F419 Anxiety disorder, unspecified: Secondary | ICD-10-CM | POA: Diagnosis present

## 2016-06-12 DIAGNOSIS — E1143 Type 2 diabetes mellitus with diabetic autonomic (poly)neuropathy: Secondary | ICD-10-CM | POA: Diagnosis present

## 2016-06-12 DIAGNOSIS — R112 Nausea with vomiting, unspecified: Secondary | ICD-10-CM | POA: Diagnosis present

## 2016-06-12 DIAGNOSIS — Z9049 Acquired absence of other specified parts of digestive tract: Secondary | ICD-10-CM

## 2016-06-12 DIAGNOSIS — E871 Hypo-osmolality and hyponatremia: Secondary | ICD-10-CM | POA: Diagnosis present

## 2016-06-12 DIAGNOSIS — E10649 Type 1 diabetes mellitus with hypoglycemia without coma: Secondary | ICD-10-CM | POA: Diagnosis present

## 2016-06-12 DIAGNOSIS — N390 Urinary tract infection, site not specified: Secondary | ICD-10-CM | POA: Diagnosis present

## 2016-06-12 DIAGNOSIS — Z885 Allergy status to narcotic agent status: Secondary | ICD-10-CM | POA: Diagnosis not present

## 2016-06-12 DIAGNOSIS — E861 Hypovolemia: Secondary | ICD-10-CM | POA: Diagnosis present

## 2016-06-12 DIAGNOSIS — Z91018 Allergy to other foods: Secondary | ICD-10-CM

## 2016-06-12 DIAGNOSIS — R000 Tachycardia, unspecified: Secondary | ICD-10-CM | POA: Diagnosis present

## 2016-06-12 DIAGNOSIS — N3000 Acute cystitis without hematuria: Secondary | ICD-10-CM

## 2016-06-12 DIAGNOSIS — K760 Fatty (change of) liver, not elsewhere classified: Secondary | ICD-10-CM | POA: Diagnosis present

## 2016-06-12 DIAGNOSIS — I10 Essential (primary) hypertension: Secondary | ICD-10-CM | POA: Diagnosis present

## 2016-06-12 DIAGNOSIS — Z7951 Long term (current) use of inhaled steroids: Secondary | ICD-10-CM | POA: Diagnosis not present

## 2016-06-12 DIAGNOSIS — N179 Acute kidney failure, unspecified: Secondary | ICD-10-CM | POA: Diagnosis not present

## 2016-06-12 DIAGNOSIS — K219 Gastro-esophageal reflux disease without esophagitis: Secondary | ICD-10-CM | POA: Diagnosis present

## 2016-06-12 DIAGNOSIS — Z91010 Allergy to peanuts: Secondary | ICD-10-CM

## 2016-06-12 DIAGNOSIS — K3184 Gastroparesis: Secondary | ICD-10-CM | POA: Diagnosis not present

## 2016-06-12 DIAGNOSIS — E869 Volume depletion, unspecified: Secondary | ICD-10-CM | POA: Diagnosis not present

## 2016-06-12 DIAGNOSIS — E1010 Type 1 diabetes mellitus with ketoacidosis without coma: Secondary | ICD-10-CM | POA: Diagnosis not present

## 2016-06-12 DIAGNOSIS — Z8249 Family history of ischemic heart disease and other diseases of the circulatory system: Secondary | ICD-10-CM

## 2016-06-12 DIAGNOSIS — Z833 Family history of diabetes mellitus: Secondary | ICD-10-CM | POA: Diagnosis not present

## 2016-06-12 DIAGNOSIS — Z79899 Other long term (current) drug therapy: Secondary | ICD-10-CM

## 2016-06-12 DIAGNOSIS — J4520 Mild intermittent asthma, uncomplicated: Secondary | ICD-10-CM | POA: Diagnosis not present

## 2016-06-12 DIAGNOSIS — E1065 Type 1 diabetes mellitus with hyperglycemia: Secondary | ICD-10-CM | POA: Diagnosis not present

## 2016-06-12 DIAGNOSIS — R1084 Generalized abdominal pain: Secondary | ICD-10-CM | POA: Diagnosis not present

## 2016-06-12 LAB — LIPASE, BLOOD: LIPASE: 14 U/L (ref 11–51)

## 2016-06-12 LAB — CBC
HCT: 43.5 % (ref 36.0–46.0)
HCT: 42.7 % (ref 36.0–46.0)
HEMOGLOBIN: 14.9 g/dL (ref 12.0–15.0)
HEMOGLOBIN: 14.4 g/dL (ref 12.0–15.0)
MCH: 29.6 pg (ref 26.0–34.0)
MCH: 29.6 pg (ref 26.0–34.0)
MCHC: 34.3 g/dL (ref 30.0–36.0)
MCHC: 33.7 g/dL (ref 30.0–36.0)
MCV: 86.5 fL (ref 78.0–100.0)
MCV: 87.7 fL (ref 78.0–100.0)
PLATELETS: 629 10*3/uL — AB (ref 150–400)
PLATELETS: 519 10*3/uL — AB (ref 150–400)
RBC: 5.03 MIL/uL (ref 3.87–5.11)
RBC: 4.87 MIL/uL (ref 3.87–5.11)
RDW: 13.8 % (ref 11.5–15.5)
RDW: 13.8 % (ref 11.5–15.5)
WBC: 9.6 10*3/uL (ref 4.0–10.5)
WBC: 16 10*3/uL — ABNORMAL HIGH (ref 4.0–10.5)

## 2016-06-12 LAB — COMPREHENSIVE METABOLIC PANEL
ALT: 9 U/L — ABNORMAL LOW (ref 14–54)
AST: 11 U/L — ABNORMAL LOW (ref 15–41)
Albumin: 4.4 g/dL (ref 3.5–5.0)
Alkaline Phosphatase: 116 U/L (ref 38–126)
Anion gap: 20 — ABNORMAL HIGH (ref 5–15)
BUN: 22 mg/dL — ABNORMAL HIGH (ref 6–20)
CALCIUM: 9.4 mg/dL (ref 8.9–10.3)
CHLORIDE: 104 mmol/L (ref 101–111)
CO2: 12 mmol/L — ABNORMAL LOW (ref 22–32)
CREATININE: 1.04 mg/dL — AB (ref 0.44–1.00)
Glucose, Bld: 178 mg/dL — ABNORMAL HIGH (ref 65–99)
POTASSIUM: 4.2 mmol/L (ref 3.5–5.1)
Sodium: 136 mmol/L (ref 135–145)
TOTAL PROTEIN: 8.6 g/dL — AB (ref 6.5–8.1)
Total Bilirubin: 1.1 mg/dL (ref 0.3–1.2)

## 2016-06-12 LAB — URINALYSIS, ROUTINE W REFLEX MICROSCOPIC
BILIRUBIN URINE: NEGATIVE
Glucose, UA: >=500 mg/dL — AB
Ketones, ur: 80 mg/dL — AB
NITRITE: NEGATIVE
PROTEIN: 100 mg/dL — AB
Specific Gravity, Urine: 1.013 (ref 1.005–1.030)
pH: 6 (ref 5.0–8.0)

## 2016-06-12 LAB — BASIC METABOLIC PANEL
Anion gap: 15 (ref 5–15)
BUN: 17 mg/dL (ref 6–20)
CALCIUM: 8.4 mg/dL — AB (ref 8.9–10.3)
CHLORIDE: 111 mmol/L (ref 101–111)
CO2: 10 mmol/L — AB (ref 22–32)
CREATININE: 0.84 mg/dL (ref 0.44–1.00)
GFR calc Af Amer: >60 mL/min (ref >60)
GFR calc non Af Amer: >60 mL/min (ref >60)
GLUCOSE: 197 mg/dL — AB (ref 65–99)
Potassium: 4 mmol/L (ref 3.5–5.1)
Sodium: 136 mmol/L (ref 135–145)

## 2016-06-12 LAB — MRSA PCR SCREENING: MRSA by PCR: NEGATIVE

## 2016-06-12 LAB — I-STAT BETA HCG BLOOD, ED (MC, WL, AP ONLY)

## 2016-06-12 LAB — GLUCOSE, CAPILLARY
GLUCOSE-CAPILLARY: 155 mg/dL — AB (ref 65–99)
Glucose-Capillary: 213 mg/dL — ABNORMAL HIGH (ref 65–99)

## 2016-06-12 LAB — CBG MONITORING, ED
Glucose-Capillary: 149 mg/dL — ABNORMAL HIGH (ref 65–99)
Glucose-Capillary: 183 mg/dL — ABNORMAL HIGH (ref 65–99)

## 2016-06-12 LAB — MAGNESIUM: MAGNESIUM: 1.5 mg/dL — AB (ref 1.7–2.4)

## 2016-06-12 MED ORDER — ALBUTEROL SULFATE HFA 108 (90 BASE) MCG/ACT IN AERS: 2.0000 | INHALATION_SPRAY | Freq: Four times a day (QID) | RESPIRATORY_TRACT | Status: DC | PRN

## 2016-06-12 MED ORDER — SODIUM CHLORIDE 0.9 % IV BOLUS (SEPSIS)
1000.0000 mL | Freq: Once | INTRAVENOUS | Status: AC
Start: 2016-06-12 — End: 2016-06-12
  Administered 2016-06-12: 1000 mL via INTRAVENOUS

## 2016-06-12 MED ORDER — SODIUM CHLORIDE 0.9 % IV SOLN: INTRAVENOUS | Status: DC

## 2016-06-12 MED ORDER — ALBUTEROL SULFATE (2.5 MG/3ML) 0.083% IN NEBU: 2.5000 mg | INHALATION_SOLUTION | Freq: Four times a day (QID) | RESPIRATORY_TRACT | Status: DC | PRN

## 2016-06-12 MED ORDER — DEXTROSE 5 % IV SOLN
1.0000 g | INTRAVENOUS | Status: DC
  Administered 2016-06-13: 1 g via INTRAVENOUS
  Filled 2016-06-12 (×2): qty 10

## 2016-06-12 MED ORDER — POTASSIUM CHLORIDE 10 MEQ/100ML IV SOLN
10.0000 meq | INTRAVENOUS | Status: AC
  Administered 2016-06-12: 10 meq via INTRAVENOUS
  Filled 2016-06-12: qty 100

## 2016-06-12 MED ORDER — PROMETHAZINE HCL 25 MG/ML IJ SOLN: 12.5000 mg | Freq: Four times a day (QID) | INTRAMUSCULAR | Status: DC | PRN

## 2016-06-12 MED ORDER — SODIUM CHLORIDE 0.9 % IV SOLN
INTRAVENOUS | Status: DC
  Administered 2016-06-12: 19:00:00 via INTRAVENOUS

## 2016-06-12 MED ORDER — PROMETHAZINE HCL 25 MG PO TABS
12.5000 mg | ORAL_TABLET | Freq: Four times a day (QID) | ORAL | Status: DC | PRN
  Administered 2016-06-13 (×2): 12.5 mg via ORAL
  Filled 2016-06-12 (×2): qty 1

## 2016-06-12 MED ORDER — SODIUM CHLORIDE 0.9 % IV SOLN
INTRAVENOUS | Status: DC
  Administered 2016-06-12: 1.2 [IU]/h via INTRAVENOUS
  Filled 2016-06-12: qty 1

## 2016-06-12 MED ORDER — SODIUM CHLORIDE 0.9 % IV BOLUS (SEPSIS)
1000.0000 mL | Freq: Once | INTRAVENOUS | Status: AC
  Administered 2016-06-12: 1000 mL via INTRAVENOUS

## 2016-06-12 MED ORDER — POTASSIUM CHLORIDE 10 MEQ/100ML IV SOLN
10.0000 meq | Freq: Once | INTRAVENOUS | Status: AC
  Administered 2016-06-12: 10 meq via INTRAVENOUS
  Filled 2016-06-12: qty 100

## 2016-06-12 MED ORDER — PANTOPRAZOLE SODIUM 40 MG IV SOLR
40.0000 mg | INTRAVENOUS | Status: DC
  Administered 2016-06-12: 40 mg via INTRAVENOUS
  Filled 2016-06-12: qty 40

## 2016-06-12 MED ORDER — ENOXAPARIN SODIUM 40 MG/0.4ML ~~LOC~~ SOLN
40.0000 mg | SUBCUTANEOUS | Status: DC
  Administered 2016-06-12 – 2016-06-13 (×2): 40 mg via SUBCUTANEOUS
  Filled 2016-06-12 (×2): qty 0.4

## 2016-06-12 MED ORDER — HYDROMORPHONE HCL 1 MG/ML IJ SOLN
0.5000 mg | INTRAMUSCULAR | Status: AC | PRN
  Administered 2016-06-13 (×4): 0.5 mg via INTRAVENOUS
  Filled 2016-06-12 (×4): qty 0.5

## 2016-06-12 MED ORDER — PROMETHAZINE HCL 25 MG RE SUPP
12.5000 mg | Freq: Four times a day (QID) | RECTAL | Status: DC | PRN
  Filled 2016-06-12: qty 1

## 2016-06-12 MED ORDER — DEXTROSE 5 % IV SOLN
1.0000 g | Freq: Once | INTRAVENOUS | Status: AC
  Administered 2016-06-12: 1 g via INTRAVENOUS
  Filled 2016-06-12: qty 10

## 2016-06-12 MED ORDER — HYDROMORPHONE HCL 1 MG/ML IJ SOLN
1.0000 mg | Freq: Once | INTRAMUSCULAR | Status: AC
  Administered 2016-06-12: 1 mg via INTRAVENOUS
  Filled 2016-06-12: qty 1

## 2016-06-12 MED ORDER — DEXTROSE-NACL 5-0.45 % IV SOLN
INTRAVENOUS | Status: DC
  Administered 2016-06-13: 05:00:00 via INTRAVENOUS

## 2016-06-12 MED ORDER — PROMETHAZINE HCL 25 MG/ML IJ SOLN
12.5000 mg | Freq: Once | INTRAMUSCULAR | Status: AC
  Administered 2016-06-12: 12.5 mg via INTRAVENOUS
  Filled 2016-06-12: qty 1

## 2016-06-12 MED ORDER — DEXTROSE-NACL 5-0.45 % IV SOLN
INTRAVENOUS | Status: DC
  Administered 2016-06-12: 21:00:00 via INTRAVENOUS

## 2016-06-12 MED ORDER — SODIUM CHLORIDE 0.9 % IV SOLN
INTRAVENOUS | Status: DC
  Filled 2016-06-12: qty 1

## 2016-06-12 NOTE — ED Triage Notes (Signed)
Pt is c/o diffuse abdominal pain 10/10 with hx of gastroparesis, EMS report orthostatic hypotension and pt is report anorexia for the last 2 days.

## 2016-06-12 NOTE — ED Notes (Signed)
Bed: WA21 Expected date:  Expected time:  Means of arrival:  Comments: 26 yo abd pain

## 2016-06-12 NOTE — ED Provider Notes (Signed)
[place] DEPT Provider Note   CSN: [id number] Arrival date & time: 06/12/16  1613     History   Chief Complaint Chief Complaint  Patient presents with  . Abdominal Pain    Gastroparesis    HPI [name] is a 26 y.o. female with PMHx of IDDM type 1, gastroparesis, anxiety, DKA states she has not been "feeling well" for the last 2 days. She reports associated moderate to severe generalized myalgias, weakness, nausea, and several episodes of emesis, no blood. She reports anorexia. She states she hasn't had anything to eat in 2 days but has been able to tolerate water due to the nausea and vomiting. She reports trying phenergan with moderate relief. She denies trying any pain medication. She states certain smells make her symptoms worse. She reports chills. She denies fevers, chest pain, SOB, diarrhea. She states she feels her symptoms are different from her previous DKA and gastroparesis. She reports worsening reflux symptoms including heart burn and burning of throat for 2 days after eating chicken. She states she tried Pepcid which usually helps her symptoms and did not this time. Pt has surgical hx of cholecystectomy.   The history is provided by the patient. No language interpreter was used.    Past Medical History:  Diagnosis Date  . Anxiety   . Arthritis   . Asthma   . Diabetes mellitus 2007   IDDM.  poorly controlled, multiple admits with DKA  . Gallstones   . Gastroparesis   . Heart murmur   . Hepatic steatosis 11/26/2014   and hepatomegaly  . Hypertension    NOT CURRENTLY ON ANY BP MED  . Liver mass 11/26/2014  . Pancreatitis, acute 11/26/2014    Patient Active Problem List   Diagnosis Date Noted  . Hematuria 05/13/2016  . DKA, type 1, not at goal [facility]) 02/29/2016  . UTI (urinary tract infection) 01/22/2016  . Depression   . Diarrhea 11/09/2015  . Acute urinary retention   . Gastroparesis due to DM ([place]) 07/10/2015  . GERD (gastroesophageal reflux  disease) 07/10/2015  . Depression with anxiety 07/10/2015  . Uncontrolled type 1 diabetes mellitus with hyperglycemia ([place])   . Gastroparesis 06/22/2015  . Altered mental status 06/22/2015  . Volume depletion 06/10/2015  . Protein-calorie malnutrition, severe 06/10/2015  . Hyperglycemia   . Intractable vomiting with nausea   . Elevated bilirubin   . Type 1 diabetes mellitus with hyperglycemia ([place]) 05/29/2015  . Hematemesis with nausea   . Intractable vomiting 05/28/2015  . Intractable nausea and vomiting 05/28/2015  . Abdominal pain in female   . Abdominal pain 05/24/2015  . Hypertension 05/24/2015  . Dehydration   . Diabetic gastroparesis ([place])   . Chronic diastolic heart failure ([place]) 04/11/2015  . Hematemesis 04/08/2015  . DKA (diabetic ketoacidoses) ([place]) 03/22/2015  . Nausea vomiting and diarrhea 03/22/2015  . Nausea & vomiting   . Diabetic ketoacidosis without coma associated with type 1 diabetes mellitus ([place])   . S/P laparoscopic cholecystectomy 02/11/2015  . Status post laparoscopic cholecystectomy 02/11/2015  . Postextubation stridor   . Nausea and vomiting 11/27/2014  . Pancreatitis, acute 11/26/2014  . Volume overload 11/26/2014  . Hypokalemia 11/26/2014  . Hepatic steatosis 11/26/2014  . Liver mass 11/26/2014  . Sepsis ([place]) 11/25/2014  . Sinus tachycardia 11/25/2014  . Hypomagnesemia 11/25/2014  . Hypophosphatemia 11/25/2014  . Elevated amylase and lipase 11/25/2014  . DKA, type 1 ([place]) 11/24/2014  . Elevated LFTs 11/24/2014  . Acute kidney injury ([place])  11/24/2014  . Migraine headache 11/24/2014  . Asthma 06/29/2012  . Uncontrolled type 1 diabetes mellitus ([place]) 06/19/2010  . Goiter, unspecified 06/19/2010    Past Surgical History:  Procedure Laterality Date  . CHOLECYSTECTOMY N/A 02/11/2015   Procedure: LAPAROSCOPIC CHOLECYSTECTOMY WITH INTRAOPERATIVE CHOLANGIOGRAM;  Surgeon: Greer Pickerel, MD;  Location: [facility];  Service: General;  Laterality: N/A;  .  ESOPHAGOGASTRODUODENOSCOPY (EGD) WITH PROPOFOL Left 09/20/2014   Procedure: ESOPHAGOGASTRODUODENOSCOPY (EGD) WITH PROPOFOL;  Surgeon: Arta Silence, MD;  Location: [facility] ENDOSCOPY;  Service: Endoscopy;  Laterality: Left;  . WISDOM TOOTH EXTRACTION      OB History    Gravida Para Term Preterm AB Living   2 1 0 1 1 1    SAB TAB Ectopic Multiple Live Births   0 1 0 0 1       Home Medications    Prior to Admission medications   Medication Sig Start Date End Date Taking? Authorizing Provider  albuterol (PROVENTIL HFA;VENTOLIN HFA) 108 (90 BASE) MCG/ACT inhaler Inhale 2 puffs into the lungs every 6 (six) hours as needed for wheezing or shortness of breath.    Yes [provider]  albuterol (PROVENTIL) (2.5 MG/3ML) 0.083% nebulizer solution Take 2.5 mg by nebulization every 6 (six) hours as needed for wheezing or shortness of breath.    Yes [provider]  budesonide-formoterol (SYMBICORT) 160-4.5 MCG/ACT inhaler Inhale 2 puffs into the lungs 2 (two) times daily.    Yes [provider]  famotidine (PEPCID) 20 MG tablet Take 1 tablet (20 mg total) by mouth at bedtime. 02/29/16  Yes Antonietta Breach, PA-C  insulin aspart (NOVOLOG FLEXPEN) 100 UNIT/ML FlexPen Inject into the skin See admin instructions. Pt uses 1 unit for every 10 grams of carbs with meals and snacks for BS greater than 50.   Yes [provider]  Insulin Glargine (BASAGLAR KWIKPEN) 100 UNIT/ML SOPN Inject 0.28 mLs (28 Units total) into the skin at bedtime. 05/16/16  Yes Donne Hazel, MD  metoCLOPramide (REGLAN) 10 MG tablet Take 1 tablet (10 mg total) by mouth 3 (three) times daily before meals. 02/29/16  Yes Antonietta Breach, PA-C  metoprolol tartrate (LOPRESSOR) 25 MG tablet Take 0.5 tablets (12.5 mg total) by mouth 2 (two) times daily. 05/10/16  Yes Mariel Aloe, MD  pantoprazole (PROTONIX) 40 MG tablet Take 1 tablet (40 mg total) by mouth 2 (two) times daily. 02/25/16  Yes Regalado, Belkys A, MD    promethazine (PHENERGAN) 25 MG tablet Take 1 tablet (25 mg total) by mouth every 6 (six) hours as needed for nausea or vomiting. 05/13/16  Yes Tegeler, Gwenyth Allegra, MD  Vitamin D, Ergocalciferol, (DRISDOL) 50000 units CAPS capsule Take 50,000 Units by mouth once a week. 05/26/16  Yes [provider]  escitalopram (LEXAPRO) 10 MG tablet Take 0.5 tablets (5 mg total) by mouth daily. Patient not taking: Reported on 06/12/2016 01/19/16   Barton Dubois, MD  ketorolac (TORADOL) 10 MG tablet Take 1 tablet (10 mg total) by mouth every 6 (six) hours as needed. Patient not taking: Reported on 05/31/2016 05/16/16   Donne Hazel, MD    Family History Family History  Problem Relation Age of Onset  . Heart disease Maternal Grandmother   . Heart disease Maternal Grandfather   . Diabetes Mother   . Hyperlipidemia Mother   . Hypertension Father   . Heart disease Father   . Hypertension Paternal Grandmother   . Cancer Paternal Grandfather     Social History Social  History  Substance Use Topics  . Smoking status: Never Smoker  . Smokeless tobacco: Never Used  . Alcohol use No     Allergies   Peanut-containing drug products; Food; and Ultram [tramadol]   Review of Systems Review of Systems  Constitutional: Positive for chills. Negative for fever.  Respiratory: Negative for shortness of breath.   Cardiovascular: Negative for chest pain.  Gastrointestinal: Positive for nausea and vomiting. Negative for abdominal pain and diarrhea.  Genitourinary: Negative for difficulty urinating and dysuria.  Musculoskeletal: Positive for myalgias.  Skin: Negative for wound.  Neurological: Positive for weakness. Negative for numbness.  All other systems reviewed and are negative.    Physical Exam Updated Vital Signs BP (!) 118/106 (BP Location: Left Arm)   Pulse (!) 141   Temp 98.7 F (37.1 C) (Oral)   Resp 18   SpO2 100%   Physical Exam  Constitutional: She appears well-developed and  well-nourished.  Uncomfortable  HENT:  Head: Normocephalic and atraumatic.  Nose: Nose normal.  Mouth/Throat: Oropharynx is clear and moist.  Eyes: Conjunctivae and EOM are normal.  Neck: Normal range of motion. No JVD present.  Cardiovascular: Normal rate, normal heart sounds and intact distal pulses.   No murmur heard. Pulmonary/Chest: Effort normal and breath sounds normal. No stridor. No respiratory distress. She has no wheezes. She has no rales.  Normal work of breathing. No respiratory distress noted.   Abdominal: Soft. There is tenderness (generalized). There is no rebound and no guarding.  Soft. Generalized tenderness throughout. Negative murphy's sign. No focal tenderness at Mcburney's point. No CVA tenderness.   Musculoskeletal: Normal range of motion.  Neurological: She is alert.  Skin: Skin is warm.  Psychiatric: She has a normal mood and affect. Her behavior is normal.  Nursing note and vitals reviewed.    ED Treatments / Results  Labs (all labs ordered are listed, but only abnormal results are displayed) Labs Reviewed  COMPREHENSIVE METABOLIC PANEL - Abnormal; Notable for the following:       Result Value   CO2 12 (*)    Glucose, Bld 178 (*)    BUN 22 (*)    Creatinine, Ser 1.04 (*)    Total Protein 8.6 (*)    AST 11 (*)    ALT 9 (*)    Anion gap 20 (*)    All other components within normal limits  CBC - Abnormal; Notable for the following:    Platelets 629 (*)    All other components within normal limits  URINALYSIS, ROUTINE W REFLEX MICROSCOPIC - Abnormal; Notable for the following:    APPearance CLOUDY (*)    Glucose, UA >=500 (*)    Hgb urine dipstick SMALL (*)    Ketones, ur 80 (*)    Protein, ur 100 (*)    Leukocytes, UA LARGE (*)    Bacteria, UA MANY (*)    Squamous Epithelial / LPF 0-5 (*)    Non Squamous Epithelial 0-5 (*)    All other components within normal limits  CBG MONITORING, ED - Abnormal; Notable for the following:     Glucose-Capillary 149 (*)    All other components within normal limits  URINE CULTURE  LIPASE, BLOOD  I-STAT BETA HCG BLOOD, ED ([facility], [facility], [facility] ONLY)  I-STAT VENOUS BLOOD GAS, ED    EKG  EKG Interpretation None       Radiology No results found.  Procedures Procedures (including critical care time)  Medications Ordered in ED Medications  dextrose 5 %-0.45 % sodium chloride infusion (not administered)  insulin regular (NOVOLIN R,HUMULIN R) 100 Units in sodium chloride 0.9 % 100 mL (1 Units/mL) infusion (not administered)  sodium chloride 0.9 % bolus 1,000 mL (1,000 mLs Intravenous New Bag/Given 06/12/16 1910)    And  0.9 %  sodium chloride infusion ( Intravenous New Bag/Given 06/12/16 1910)  potassium chloride 10 mEq in 100 mL IVPB (not administered)  cefTRIAXone (ROCEPHIN) 1 g in dextrose 5 % 50 mL IVPB (1 g Intravenous New Bag/Given 06/12/16 1912)  sodium chloride 0.9 % bolus 1,000 mL (1,000 mLs Intravenous New Bag/Given 06/12/16 1748)  promethazine (PHENERGAN) injection 12.5 mg (12.5 mg Intravenous Given 06/12/16 1747)  HYDROmorphone (DILAUDID) injection 1 mg (1 mg Intravenous Given 06/12/16 1910)     Initial Impression / Assessment and Plan / ED Course  I have reviewed the triage vital signs and the nursing notes.  Pertinent labs & imaging results that were available during my care of the patient were reviewed by me and considered in my medical decision making (see chart for details).    Pt is a 26 yo female with hx of type 1 DM, gastroparesis, previous DKA who presents with anorexia and generalized myalgias. On exam, pt uncomfortable.  Tachycardic. Afebrile. Lungs CTA. Heart RRR. Abdomen soft with generalized tenderness. POC Glucose 178. CBC 9.6. Potassium 4.2. Gap 20. UA shows ketones and signs of infection with + Leuk, WBC on UA too numerous to count, and many bacteria. Given NS bolus 2 L in ED as well as 100 U Insulin. Plan is to admit. Pt urine cultured and given dose of  cefriaxone for UTI. Pt also seen and evaluated by Dr. Maryan Rued who agrees with assessment and plan.  I spoke with Dr. Daryll Drown who agreed to have pt admitted for further evaluation and treatment at stepdown.   Final Clinical Impressions(s) / ED Diagnoses   Final diagnoses:  Diabetic ketoacidosis without coma associated with type 1 diabetes mellitus ([place])  Acute cystitis without hematuria    New Prescriptions New Prescriptions   No medications on file     Bettey Costa, [place] 06/12/16 2243    Blanchie Dessert, MD 06/13/16 0005

## 2016-06-12 NOTE — H&P (Addendum)
History and Physical    Stefannie Defeo UMP:[id number] DOB: 1990-09-21 DOA: 06/12/2016  PCP: Vicenta Aly, FNP  Patient coming from: Home   Chief Complaint: Generalized abdominal pain, myalgias  HPI: Barry Culverhouse is a 26 y.o. female with medical history significant of DM1, Asthma, diabetic gastroparesis who presents for abdominal pain, myalgia, nausea and vomiting.  She reports that 2 days ago she was at her brothers where she thinks she may have eaten undercooked chicken.  She had vomiting that day but then just nausea.  She did have decreased PO intake and was only taking in water.  This morning, she again developed vomiting a few times and came in to the ED.  The emesis was initially the food she had eaten but has since just been yellowish/green stomach contents, she denies hematemesis.  She has been taking her lantus, but not her aspart  Insulin as she has not been eating.  She further reports generalized muscle aches and abdominal pain.  She has no urinary symptoms, reports normal urine output, normal color and no dysuria.  Further symptoms include chills without fever.   She has not required her albuterol inhaler for her asthma in quite a while and denies respiratory complaints.    ED Course: In the ED, she was found to have a mildly elevated glucose, low CO2 to 12, AG of 20, + ketones in the urine (though this seems to be chronic for her), blood gas pending at the time of my evaluation.  She was initiated on insulin drip and D5 1/2 NS as her CBG was already < 250.  K was 4.2, so she was initiated on KCL by IV in the ED.  She met criteria for moderate DKA given she was drowsy but alert, low bicarb and high AG.    Of note, patient has been admitted multiple times in the last year with similar episodes, severe dehydration and tachycardia which improve with appropriate therapy.  She has had 13 admissions in the past year (this being her 89th).  Review of Systems: As per HPI otherwise 10  point review of systems negative.  She specifically denied chest pain, urinary complaints, fever.  She reports her nausea is well controlled with phenergan.    Past Medical History:  Diagnosis Date  . Anxiety   . Arthritis   . Asthma   . Diabetes mellitus 2007   IDDM.  poorly controlled, multiple admits with DKA  . Gallstones   . Gastroparesis   . Heart murmur   . Hepatic steatosis 11/26/2014   and hepatomegaly  . Hypertension    NOT CURRENTLY ON ANY BP MED  . Liver mass 11/26/2014  . Pancreatitis, acute 11/26/2014    Past Surgical History:  Procedure Laterality Date  . CHOLECYSTECTOMY N/A 02/11/2015   Procedure: LAPAROSCOPIC CHOLECYSTECTOMY WITH INTRAOPERATIVE CHOLANGIOGRAM;  Surgeon: Greer Pickerel, MD;  Location: [facility] ORS;  Service: General;  Laterality: N/A;  . ESOPHAGOGASTRODUODENOSCOPY (EGD) WITH PROPOFOL Left 09/20/2014   Procedure: ESOPHAGOGASTRODUODENOSCOPY (EGD) WITH PROPOFOL;  Surgeon: Arta Silence, MD;  Location: [facility] ENDOSCOPY;  Service: Endoscopy;  Laterality: Left;  . WISDOM TOOTH EXTRACTION     She reports no smoking, every other day marijuana use.    reports that she has never smoked. She has never used smokeless tobacco. She reports that she uses drugs, including Marijuana. She reports that she does not drink alcohol.  Allergies  Allergen Reactions  . Peanut-Containing Drug Products Swelling and Other (See Comments)    Reaction:  Swelling  of mouth and lips   . 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   Reaction:  Swelling of mouth and lips   . Ultram [Tramadol] Itching   Reviewed with patient.  Family History  Problem Relation Age of Onset  . Heart disease Maternal Grandmother   . Heart disease Maternal Grandfather   . Diabetes Mother   . Hyperlipidemia Mother   . Hypertension Father   . Heart disease Father   . Hypertension Paternal Grandmother   . Cancer Paternal Grandfather     Prior to Admission medications   Medication  Sig Start Date End Date Taking? Authorizing Provider  albuterol (PROVENTIL HFA;VENTOLIN HFA) 108 (90 BASE) MCG/ACT inhaler Inhale 2 puffs into the lungs every 6 (six) hours as needed for wheezing or shortness of breath.    Yes [provider]  albuterol (PROVENTIL) (2.5 MG/3ML) 0.083% nebulizer solution Take 2.5 mg by nebulization every 6 (six) hours as needed for wheezing or shortness of breath.    Yes [provider]  budesonide-formoterol (SYMBICORT) 160-4.5  MCG/ACT inhaler Reported to me that she is not taking this medication Inhale 2 puffs into the lungs 2 (two) times daily.    Yes [provider]  famotidine (PEPCID) 20 MG tablet Take 1 tablet (20 mg total) by mouth at bedtime. 02/29/16  Yes Antonietta Breach, PA-C  insulin aspart (NOVOLOG FLEXPEN) 100 UNIT/ML FlexPen Not taken recently.  Inject into the skin See admin instructions. Pt uses 1 unit for every 10 grams of carbs with meals and snacks for BS greater than 50.   Yes [provider]  Insulin Glargine (BASAGLAR KWIKPEN) 100 UNIT/ML SOPN Inject 0.28 mLs (28 Units total) into the skin at bedtime. 05/16/16  Yes Donne Hazel, MD  metoCLOPramide (REGLAN) 10 MG tablet Take 1 tablet (10 mg total) by mouth 3 (three) times daily before meals. 02/29/16  Yes Antonietta Breach, PA-C  metoprolol tartrate (LOPRESSOR) 25 MG tablet Take 0.5 tablets (12.5 mg total) by mouth 2 (two) times daily. 05/10/16  Yes Mariel Aloe, MD  pantoprazole (PROTONIX) 40 MG tablet Take 1 tablet (40 mg total) by mouth 2 (two) times daily. 02/25/16  Yes Regalado, Belkys A, MD  promethazine (PHENERGAN) 25 MG tablet Take 1 tablet (25 mg total) by mouth every 6 (six) hours as needed for nausea or vomiting. 05/13/16  Yes Tegeler, Gwenyth Allegra, MD  Vitamin D, Ergocalciferol, (DRISDOL) 50000 units CAPS capsule Take 50,000 Units by mouth once a week. 05/26/16  Yes [provider]                  Physical Exam: Vitals:   06/12/16 1625  06/12/16 1756  BP: 127/78 (!) 118/106  Pulse: (!) 141 (!) 141  Resp: 18 18  Temp: 98.7 F (37.1 C)   TempSrc: Oral   SpO2: 100% 100%    Constitutional: NAD, lying in bed, drowsy but awake.  Vitals:   06/12/16 1625 06/12/16 1756  BP: 127/78 (!) 118/106  Pulse: (!) 141 (!) 141  Resp: 18 18  Temp: 98.7 F (37.1 C)   TempSrc: Oral   SpO2: 100% 100%   Eyes: PERRL, lids and conjunctivae normal, no scleral icterus ENMT: Mucous membranes are very dry, white plaque on central tongue. Posterior pharynx clear of any exudate or lesions.  Normal dentition.  Respiratory: CTAB, no wheezing, no crackles. Normal respiratory effort.  Cardiovascular: tachycardic rate and normal rhythm, no murmurs / rubs /  gallops. No extremity edema. 2+ radial pulses Abdomen: Soft, some mild generalized tenderness, no masses felt.  Musculoskeletal: No clubbing or cyanosis.  Muscle tone was normal Skin: She has multiple darkened skin changes on arms and face, these appear chronic.  She has no new rash or wounds on visible skin.   Neurologic: Grossly intact, conversant, no slurring of speech Psychiatric: Normal judgment and insight. Alert and oriented x 3. Drowsy   Labs on Admission: I have personally reviewed following labs and imaging studies  CBC:  Recent Labs Lab 06/12/16 1729  WBC 9.6  HGB 14.9  HCT 43.5  MCV 86.5  PLT 314*   Basic Metabolic Panel:  Recent Labs Lab 06/05/16 2021 06/06/16 0147 06/06/16 0355 06/12/16 1729  NA 135 134* 137 136  K 3.6 3.5 3.6 4.2  CL 108 106 107 104  CO2 17* 16* 19* 12*  GLUCOSE 279* 207* 278* 178*  BUN <5* <5* <5* 22*  CREATININE 0.51 0.57 0.55 1.04*  CALCIUM 8.8* 8.8* 8.7* 9.4   GFR: Estimated Creatinine Clearance: 70.8 mL/min (A) (by C-G formula based on SCr of 1.04 mg/dL (H)). Liver Function Tests:  Recent Labs Lab 06/12/16 1729  AST 11*  ALT 9*  ALKPHOS 116  BILITOT 1.1  PROT 8.6*  ALBUMIN 4.4    Recent Labs Lab 06/12/16 1729  LIPASE  14   No results for input(s): AMMONIA in the last 168 hours. Coagulation Profile: No results for input(s): INR, PROTIME in the last 168 hours. Cardiac Enzymes: No results for input(s): CKTOTAL, CKMB, CKMBINDEX, TROPONINI in the last 168 hours. BNP (last 3 results) No results for input(s): PROBNP in the last 8760 hours. HbA1C: No results for input(s): HGBA1C in the last 72 hours. CBG:  Recent Labs Lab 06/06/16 0412 06/06/16 0926 06/06/16 1151 06/06/16 1629 06/12/16 1732  GLUCAP 269* 193* 170* 146* 149*   Lipid Profile: No results for input(s): CHOL, HDL, LDLCALC, TRIG, CHOLHDL, LDLDIRECT in the last 72 hours. Thyroid Function Tests: No results for input(s): TSH, T4TOTAL, FREET4, T3FREE, THYROIDAB in the last 72 hours. Anemia Panel: No results for input(s): VITAMINB12, FOLATE, FERRITIN, TIBC, IRON, RETICCTPCT in the last 72 hours. Urine analysis: Urinalysis    Component Value Date/Time   COLORURINE YELLOW 06/12/2016 1729   APPEARANCEUR CLOUDY (A) 06/12/2016 1729   LABSPEC 1.013 06/12/2016 1729   PHURINE 6.0 06/12/2016 1729   GLUCOSEU >=500 (A) 06/12/2016 1729   GLUCOSEU >=1000 11/07/2012 1205   HGBUR SMALL (A) 06/12/2016 1729   BILIRUBINUR NEGATIVE 06/12/2016 1729   KETONESUR 80 (A) 06/12/2016 1729   PROTEINUR 100 (A) 06/12/2016 1729   UROBILINOGEN 0.2 11/24/2014 1045   NITRITE NEGATIVE 06/12/2016 1729   LEUKOCYTESUR LARGE (A) 06/12/2016 1729    Radiological Exams on Admission: No results found.   Assessment/Plan Diabetic ketoacidosis without coma associated with type 1 diabetes mellitus  - Given her low bicarb, AG, ketones in urine she meets criteria for DKA, though this is somewhat atypical given her relatively normal glucose (170s only).  However, on review of her previous labs, she usually has an AG in the normal range and bicarb closer to 20 - Admit to SDU - Insulin drip - BMET q 4 hours, initiate long acting insulin once gap is closed - Add on magnesium to  previous labs - Given K is 4.2, KCl 48mq was started in the ED and I added another run of 10 meq - bmet as above - NPO - IVF with D51/2NS at 125cc/hr - Dilaudid  for pain - Phenergan for nausea - VBG pending for pH assessment - ordered in the ED - I reviewed her recent endocrinology note (5/8) and she is actually on 12 units of lantus only and a SSI insulin with carb counting.   Acute kidney injury due to Volume depletion - BUN/Cr  22/1.04 - pre-renal.  She normal has a BUN < 5 - IVF as noted above - BMETs q 4 hours in acute setting - Monitor on telemetry given tachycardia (140s) - Check EKG  UTI (urinary tract infection) - Her UA does have leukocytes in it, no nitrites - She has no urinary symptoms, so I am not sure she truly has a UTI - Monitor for change in symptoms, she has received a dose of rocephin in the ED - Follow UC, if no growth would stop Abx  Nausea and vomiting in the setting of Diabetic gastroparesis  - Phenergan prn for nausea, emesis - I have held reglan in the setting of acute DKA - restart tomorrow or on resumption of food - IV PPI until tolerating PO - Lipase normal.   Asthma - PRN albuterol     DVT prophylaxis: Lovenox  Code Status: Full  Family Communication: She wishes her mom to be kept UTD on her progress Clarene Critchley [phone] Disposition Plan: D/C in 2-3 days, given early nature of disease, may be sooner Consults called: None Admission status: SDU, inpatient    Gilles Chiquito MD [facility] Pager [phone]  If 7PM-7AM, please contact night-coverage [email or web address] Password [facility]  06/12/2016, 7:51 PM

## 2016-06-13 DIAGNOSIS — E869 Volume depletion, unspecified: Secondary | ICD-10-CM

## 2016-06-13 DIAGNOSIS — E1143 Type 2 diabetes mellitus with diabetic autonomic (poly)neuropathy: Secondary | ICD-10-CM

## 2016-06-13 DIAGNOSIS — K219 Gastro-esophageal reflux disease without esophagitis: Secondary | ICD-10-CM

## 2016-06-13 DIAGNOSIS — J4520 Mild intermittent asthma, uncomplicated: Secondary | ICD-10-CM

## 2016-06-13 DIAGNOSIS — R112 Nausea with vomiting, unspecified: Secondary | ICD-10-CM

## 2016-06-13 DIAGNOSIS — E871 Hypo-osmolality and hyponatremia: Secondary | ICD-10-CM

## 2016-06-13 DIAGNOSIS — N390 Urinary tract infection, site not specified: Secondary | ICD-10-CM

## 2016-06-13 DIAGNOSIS — K3184 Gastroparesis: Secondary | ICD-10-CM

## 2016-06-13 DIAGNOSIS — N179 Acute kidney failure, unspecified: Secondary | ICD-10-CM

## 2016-06-13 DIAGNOSIS — E1010 Type 1 diabetes mellitus with ketoacidosis without coma: Principal | ICD-10-CM

## 2016-06-13 DIAGNOSIS — N3000 Acute cystitis without hematuria: Secondary | ICD-10-CM

## 2016-06-13 DIAGNOSIS — E1065 Type 1 diabetes mellitus with hyperglycemia: Secondary | ICD-10-CM

## 2016-06-13 DIAGNOSIS — R000 Tachycardia, unspecified: Secondary | ICD-10-CM

## 2016-06-13 LAB — GLUCOSE, CAPILLARY
GLUCOSE-CAPILLARY: 120 mg/dL — AB (ref 65–99)
GLUCOSE-CAPILLARY: 146 mg/dL — AB (ref 65–99)
GLUCOSE-CAPILLARY: 152 mg/dL — AB (ref 65–99)
GLUCOSE-CAPILLARY: 174 mg/dL — AB (ref 65–99)
GLUCOSE-CAPILLARY: 186 mg/dL — AB (ref 65–99)
GLUCOSE-CAPILLARY: 199 mg/dL — AB (ref 65–99)
GLUCOSE-CAPILLARY: 220 mg/dL — AB (ref 65–99)
GLUCOSE-CAPILLARY: 240 mg/dL — AB (ref 65–99)
Glucose-Capillary: 165 mg/dL — ABNORMAL HIGH (ref 65–99)
Glucose-Capillary: 162 mg/dL — ABNORMAL HIGH (ref 65–99)
Glucose-Capillary: 141 mg/dL — ABNORMAL HIGH (ref 65–99)
Glucose-Capillary: 126 mg/dL — ABNORMAL HIGH (ref 65–99)
Glucose-Capillary: 131 mg/dL — ABNORMAL HIGH (ref 65–99)
Glucose-Capillary: 173 mg/dL — ABNORMAL HIGH (ref 65–99)
Glucose-Capillary: 223 mg/dL — ABNORMAL HIGH (ref 65–99)

## 2016-06-13 LAB — CBC WITH DIFFERENTIAL/PLATELET
Basophils Absolute: 0 10*3/uL (ref 0.0–0.1)
Basophils Relative: 0 %
EOS ABS: 0.2 10*3/uL (ref 0.0–0.7)
Eosinophils Relative: 3 %
HCT: 35.6 % — ABNORMAL LOW (ref 36.0–46.0)
HEMOGLOBIN: 12.1 g/dL (ref 12.0–15.0)
LYMPHS ABS: 1.6 10*3/uL (ref 0.7–4.0)
LYMPHS PCT: 24 %
MCH: 29.4 pg (ref 26.0–34.0)
MCHC: 34 g/dL (ref 30.0–36.0)
MCV: 86.4 fL (ref 78.0–100.0)
Monocytes Absolute: 0.7 10*3/uL (ref 0.1–1.0)
Monocytes Relative: 10 %
NEUTROS ABS: 4.3 10*3/uL (ref 1.7–7.7)
NEUTROS PCT: 63 %
Platelets: 421 10*3/uL — ABNORMAL HIGH (ref 150–400)
RBC: 4.12 MIL/uL (ref 3.87–5.11)
RDW: 13.9 % (ref 11.5–15.5)
WBC: 6.7 10*3/uL (ref 4.0–10.5)

## 2016-06-13 LAB — BASIC METABOLIC PANEL
ANION GAP: 11 (ref 5–15)
ANION GAP: 9 (ref 5–15)
Anion gap: 10 (ref 5–15)
BUN: 17 mg/dL (ref 6–20)
BUN: 17 mg/dL (ref 6–20)
BUN: 12 mg/dL (ref 6–20)
CALCIUM: 8.3 mg/dL — AB (ref 8.9–10.3)
CALCIUM: 8.5 mg/dL — AB (ref 8.9–10.3)
CALCIUM: 8.2 mg/dL — AB (ref 8.9–10.3)
CHLORIDE: 107 mmol/L (ref 101–111)
CO2: 12 mmol/L — ABNORMAL LOW (ref 22–32)
CO2: 14 mmol/L — ABNORMAL LOW (ref 22–32)
CO2: 14 mmol/L — AB (ref 22–32)
CREATININE: 0.66 mg/dL (ref 0.44–1.00)
CREATININE: 0.59 mg/dL (ref 0.44–1.00)
CREATININE: 0.65 mg/dL (ref 0.44–1.00)
Chloride: 113 mmol/L — ABNORMAL HIGH (ref 101–111)
Chloride: 112 mmol/L — ABNORMAL HIGH (ref 101–111)
GFR calc non Af Amer: >60 mL/min (ref >60)
GLUCOSE: 165 mg/dL — AB (ref 65–99)
GLUCOSE: 156 mg/dL — AB (ref 65–99)
Glucose, Bld: 198 mg/dL — ABNORMAL HIGH (ref 65–99)
Potassium: 3.7 mmol/L (ref 3.5–5.1)
Potassium: 4.2 mmol/L (ref 3.5–5.1)
Potassium: 4.2 mmol/L (ref 3.5–5.1)
SODIUM: 131 mmol/L — AB (ref 135–145)
Sodium: 136 mmol/L (ref 135–145)
Sodium: 135 mmol/L (ref 135–145)

## 2016-06-13 LAB — MAGNESIUM: MAGNESIUM: 1.4 mg/dL — AB (ref 1.7–2.4)

## 2016-06-13 LAB — PHOSPHORUS: PHOSPHORUS: 2.1 mg/dL — AB (ref 2.5–4.6)

## 2016-06-13 LAB — LACTIC ACID, PLASMA: Lactic Acid, Venous: 0.9 mmol/L (ref 0.5–1.9)

## 2016-06-13 MED ORDER — INSULIN ASPART 100 UNIT/ML ~~LOC~~ SOLN
3.0000 [IU] | Freq: Once | SUBCUTANEOUS | Status: AC
  Administered 2016-06-13: 3 [IU] via SUBCUTANEOUS

## 2016-06-13 MED ORDER — INSULIN GLARGINE 100 UNIT/ML ~~LOC~~ SOLN
13.0000 [IU] | Freq: Every day | SUBCUTANEOUS | Status: DC
  Administered 2016-06-14: 13 [IU] via SUBCUTANEOUS
  Filled 2016-06-13: qty 0.13

## 2016-06-13 MED ORDER — METOPROLOL TARTRATE 25 MG PO TABS
12.5000 mg | ORAL_TABLET | Freq: Two times a day (BID) | ORAL | Status: DC
  Administered 2016-06-13 – 2016-06-14 (×3): 12.5 mg via ORAL
  Filled 2016-06-13 (×3): qty 1

## 2016-06-13 MED ORDER — INSULIN ASPART 100 UNIT/ML ~~LOC~~ SOLN: 0.0000 [IU] | Freq: Every day | SUBCUTANEOUS | Status: DC

## 2016-06-13 MED ORDER — METOCLOPRAMIDE HCL 10 MG PO TABS
10.0000 mg | ORAL_TABLET | Freq: Three times a day (TID) | ORAL | Status: DC
  Administered 2016-06-13 – 2016-06-14 (×3): 10 mg via ORAL
  Filled 2016-06-13 (×3): qty 1

## 2016-06-13 MED ORDER — MAGNESIUM SULFATE 2 GM/50ML IV SOLN
2.0000 g | Freq: Once | INTRAVENOUS | Status: AC
  Administered 2016-06-13: 2 g via INTRAVENOUS
  Filled 2016-06-13: qty 50

## 2016-06-13 MED ORDER — PANTOPRAZOLE SODIUM 40 MG PO TBEC
40.0000 mg | DELAYED_RELEASE_TABLET | Freq: Two times a day (BID) | ORAL | Status: DC
  Administered 2016-06-13 – 2016-06-14 (×2): 40 mg via ORAL
  Filled 2016-06-13 (×2): qty 1

## 2016-06-13 MED ORDER — INSULIN ASPART 100 UNIT/ML ~~LOC~~ SOLN
4.0000 [IU] | Freq: Three times a day (TID) | SUBCUTANEOUS | Status: DC
  Administered 2016-06-13 (×2): 4 [IU] via SUBCUTANEOUS

## 2016-06-13 MED ORDER — INSULIN GLARGINE 100 UNIT/ML ~~LOC~~ SOLN
5.0000 [IU] | Freq: Once | SUBCUTANEOUS | Status: AC
  Administered 2016-06-13: 5 [IU] via SUBCUTANEOUS
  Filled 2016-06-13: qty 0.05

## 2016-06-13 MED ORDER — INSULIN ASPART 100 UNIT/ML ~~LOC~~ SOLN
0.0000 [IU] | Freq: Three times a day (TID) | SUBCUTANEOUS | Status: DC
  Administered 2016-06-13: 3 [IU] via SUBCUTANEOUS
  Administered 2016-06-13: 2 [IU] via SUBCUTANEOUS
  Administered 2016-06-14: 3 [IU] via SUBCUTANEOUS
  Administered 2016-06-14: 5 [IU] via SUBCUTANEOUS

## 2016-06-13 MED ORDER — INSULIN GLARGINE 100 UNIT/ML ~~LOC~~ SOLN: 13.0000 [IU] | Freq: Every day | SUBCUTANEOUS | Status: DC

## 2016-06-13 MED ORDER — INSULIN GLARGINE 100 UNIT/ML ~~LOC~~ SOLN
8.0000 [IU] | Freq: Every day | SUBCUTANEOUS | Status: DC
  Administered 2016-06-13: 8 [IU] via SUBCUTANEOUS
  Filled 2016-06-13: qty 0.08

## 2016-06-13 MED ORDER — SODIUM CHLORIDE 0.9 % IV SOLN
INTRAVENOUS | Status: DC
  Administered 2016-06-13: 15:00:00 via INTRAVENOUS

## 2016-06-13 MED ORDER — FAMOTIDINE 20 MG PO TABS
20.0000 mg | ORAL_TABLET | Freq: Every day | ORAL | Status: DC
  Administered 2016-06-13: 20 mg via ORAL
  Filled 2016-06-13: qty 1

## 2016-06-13 MED ORDER — INSULIN ASPART 100 UNIT/ML ~~LOC~~ SOLN: 0.0000 [IU] | Freq: Three times a day (TID) | SUBCUTANEOUS | Status: DC

## 2016-06-13 MED ORDER — DEXTROSE 5 % IV SOLN
30.0000 mmol | Freq: Once | INTRAVENOUS | Status: AC
  Administered 2016-06-13: 30 mmol via INTRAVENOUS
  Filled 2016-06-13: qty 10

## 2016-06-13 MED ORDER — PANTOPRAZOLE SODIUM 40 MG PO TBEC: 40.0000 mg | DELAYED_RELEASE_TABLET | Freq: Every day | ORAL | Status: DC

## 2016-06-13 MED ORDER — INSULIN GLARGINE 100 UNIT/ML ~~LOC~~ SOLN
13.0000 [IU] | Freq: Every day | SUBCUTANEOUS | Status: DC
  Filled 2016-06-13: qty 0.13

## 2016-06-13 NOTE — Progress Notes (Signed)
Inpatient Diabetes Program Recommendations  AACE/ADA: New Consensus Statement on Inpatient Glycemic Control (2015)  Target Ranges:  Prepandial:   less than 140 mg/dL      Peak postprandial:   less than 180 mg/dL (1-2 hours)      Critically ill patients:  140 - 180 mg/dL   Lab Results  Component Value Date   GLUCAP 174 (H) 06/13/2016   HGBA1C 8.3 (H) 06/05/2016    Review of Glycemic Control  Diabetes history: DM1 Outpatient Diabetes medications: D/C home on Basaglar 28 units 5/19/188  (pt. Told Dr. Alfredia Ferguson she has been taking 16 units daily) + Novolog 1 unit per 10 gm carbohydrate Current orders for Inpatient glycemic control: Lantus 8 + Novolog correction 0-9 units tid  Inpatient Diabetes Program Recommendations:  Patient D/C 06/05/16 and has had 8 admissions and 2 ER visits over the past 6 months. Spoke with Dr. Alfredia Ferguson and patient said she was only taking Basaglar 16 units daily although D/C 06/06/16 on 28 units. Patient to be given additional 5 units Lantus and reevaluate this pm. Also ordering Novolog meal coverage 4 units tid if eats 50% meals. Spoke with RN Lacinda Axon and discussed patient's continued admissions. RN to check if patient is rotating sites, insulin used 28 days or less and if any other variance affecting CBGs. Will follow.  Thank you, Nani Gasser. Stefanie Deltoro, RN, MSN, CDE  Diabetes Coordinator Inpatient Glycemic Control Team Team Pager [phone] (8am-5pm) 06/13/2016 10:36 AM

## 2016-06-13 NOTE — Progress Notes (Signed)
PROGRESS NOTE    Sarita Hakanson  LKG:[id number] DOB: 09-Jan-1991 DOA: 06/12/2016 PCP: Vicenta Aly, FNP  Brief Narrative:  [name] is a 26 y.o. female with history of diabetes mellitus type 1 with history of gastroparesis, and multple admissions in the last 6 months presented to the ER with complaints of nausea vomiting. Patient also has been having crampy abdominal discomfort. Patient's symptoms started this morning. Denies any recent travel or sick contacts. Denies any diarrhea. Has had multiple episodes of vomiting. the ER despite giving antiemetics and fluids patient was still having nausea and vomiting. Patient is being admitted for early DKA Type 1 and N/V from Gastroparesis. She is off the Insulin gtt and slowly improving and now tolerating Clear Liquids and will try to advance to [facility] and then Soft. She has been stable and will transfer patient from SDU to [facility] with Telemetry.   Assessment & Plan:   Active Problems:   Asthma   Acute kidney injury (HCC)   Sinus tachycardia   Hypomagnesemia   Hypophosphatemia   Nausea and vomiting   Diabetic ketoacidosis without coma associated with type 1 diabetes mellitus ([place])   Diabetic gastroparesis (HCC)   Volume depletion   Uncontrolled type 1 diabetes mellitus with hyperglycemia (HCC)   Gastroparesis due to DM (HCC)   GERD (gastroesophageal reflux disease)   UTI (urinary tract infection)   Hyponatremia  Diabetic Ketoacidosis without coma associated with Type 1 diabetes mellitus  -Patient has had multiple admissions in the last 6 months -Given her low bicarb, AG, ketones in urine she meets criteria for DKA, though this is somewhat atypical given her relatively normal glucose (170s only). However, on review of her previous labs, she usually has an AG in the normal range and bicarb closer to 20 - Admitted to SDU - Insulin drip stopped early this AM prior to my shift - BMET was done q 4 hours and  initiated long acting  insulin once gap is closed (AG is now 10) -> Bicarb was 14 again - Added on magnesium to previous labs and was 1.4 -Gave patient Diet as she wanted to eat and GAP was closed but Bicarb still not above 20 -Changed IVF with D51/2NS at 125cc/hr to 75 mL/hr -Was started on IV Dilaudid for pain with 0.5 mg IV q4hprn for Severe Pain -Phenergan 12.5 mg po/IV/RC for Nausea q6hprn for Nausea -Lactic Acid this AM was 0.9  -VBG pending for pH assessment - ordered in the ED was cancelled; ABG was ordered and showed 7.287/25.6/107/11.9/98.2% -Unclear what Insulin Regimen patient is currently taking at home -Discussed with Diabetic Education Coordinator and Started patient on 13 units of Lantus, Novolog Sensitive SSI, and 4 units of Novolog with meals if patient eats >50% of her meal -Continue to monitor CBG's and BMP's -Patient improving and likely able to transfer to Medical Floor with Telemetry  -CBG's ranging from 126-240  Acute Kidney Injury due to Volume depletion, improved -BUN/Cr 22/1.04  On Admission -Pre-renal.  She normal has a BUN < 5 -IVF as noted above -Most recent BUN/Cr was 12/0.65 -C/w IVF with D5W 1/2 NS at 75 mL/hr and will likely D/C this Afternoon -Repeat BMP now -Continue Monitor on telemetry given Tachycardia (140s on admission and improved to 110's)- Restarted Home Metoprolol 12.5 mg po BID -Repeat BMP now and CMP in AM  UTI (urinary tract infection) - Her UA does have leukocytes in it and TNTC WBC's, no nitrites, but had Many Bacteria - She has no urinary  symptoms, so I am not sure she truly has a UTI - Monitor for change in symptoms, she has received a dose of rocephin in the ED - Follow UC (pending), if no growth would stop Abx  Nausea and Vomiting in the setting of Diabetic Gastroparesis -Advancing Diet Slowly; Patient is now on Full Liquids and will attempt to advance to Soft for Dinner -Phenergan 12.5 mg po/IV/RC for Nausea q6hprn for Nausea Phenergan prn for  nausea, emesis -Resume Metoclopramide 10 mg po TID - Discontinued IV PPI and changed to po Pantoprazole 40 mg po BID and restart Famotidine 20 mg po qHS - Lipase normal at 14.   Asthma -Not in Exacerbation -C/w Albuterol Neb 2.5 mg q6hprn for wheezing and SOB  Hypophosphatemia -Patient's Phos Level was 2.1 -Replete with 30 mmol of KPhos IV -Continue to Monitor Phos Level and Replete as Necessary -Repelate Phos Level in AM  Hypomagnesemia -Patient's Mag Level this AM was 1.4 -Replete with IV Mag Sulfate 2 grams -Continue to Monitor Mag Level and Replete as Necessary -Replete Mag Level in AM  Leukocytosis, improved -Was likely reactive to DKA -Improved and went from 16.0 -> 6.7 -Repeat CBC in AM and continue to monitor for S/Sx of Infection; (UTI less likely as she is asymptomatic but will wait for Urine Cx before D/C'ing Abx)  Hyponatremia -Most Recent BMP showed Sodium of 131 -D/C'd D5W 1/2 NS and restarted NS at 75 mL/hr -Repeat CMP in AM  DVT prophylaxis: Enoxaparin 40 mg sq q24h Code Status: FULL CODE Family Communication: No family present at bedside Disposition Plan: Transfer to the Medical Floor with Telemetry if Stable.  Consultants:   None   Procedures: None   Antimicrobials:  Anti-infectives    Start     Dose/Rate Route Frequency Ordered Stop   06/13/16 1800  cefTRIAXone (ROCEPHIN) 1 g in dextrose 5 % 50 mL IVPB     1 g 100 mL/hr over 30 Minutes Intravenous Every 24 hours 06/12/16 2137     06/12/16 1845  cefTRIAXone (ROCEPHIN) 1 g in dextrose 5 % 50 mL IVPB     1 g 100 mL/hr over 30 Minutes Intravenous  Once 06/12/16 1832 06/12/16 1942     Subjective: Seen and examined at bedside and was feeling better and was not nauseous. No CP or SOB. Wanting to eat. HR still tachycardic but patient asymptomatic.   Objective: Vitals:   06/13/16 0637 06/13/16 0700 06/13/16 0800 06/13/16 1200  BP: 107/68  (!) 102/49 124/85  Pulse:      Resp: 19  (!) 22 12    Temp:  98.6 F (37 C)  98.1 F (36.7 C)  TempSrc:  Oral  Oral  SpO2: 99%  98% 100%  Weight:      Height:        Intake/Output Summary (Last 24 hours) at 06/13/16 1452 Last data filed at 06/13/16 1300  Gross per 24 hour  Intake           5189.5 ml  Output                0 ml  Net           5189.5 ml   Filed Weights   06/12/16 2131  Weight: 52.4 kg (115 lb 8.3 oz)   Examination: Physical Exam:  Constitutional: Thin AAF in NAD and appears calm and comfortable Eyes: Lids and conjunctivae normal, sclerae anicteric  ENMT: External Ears, Nose appear normal. Grossly normal hearing. Mucous membranes  are slightly dry. Neck: Appears normal, supple, no cervical masses, normal ROM, no appreciable thyromegaly, no JVD Respiratory: Diminished to auscultation bilaterally, no wheezing, rales, rhonchi or crackles. Normal respiratory effort and patient is not tachypenic. No accessory muscle use.  Cardiovascular: Slightly Tachycardic Rate and Regular Rhythm, no murmurs / rubs / gallops. S1 and S2 auscultated. No extremity edema.  Abdomen: Soft, non-tender, non-distended. No masses palpated. No appreciable hepatosplenomegaly. Bowel sounds positive x4.  GU: Deferred. Musculoskeletal: No clubbing / cyanosis of digits/nails. No joint deformity upper and lower extremities. Good ROM, no contractures.  Skin: No rashes, lesions, ulcers on limited skin evaluation. No induration; Warm and dry.  Neurologic: CN 2-12 grossly intact with no focal deficits. Romberg sign cerebellar reflexes not assessed.  Psychiatric: Normal judgment and insight. Alert and oriented x 3. Normal mood and appropriate affect.   Data Reviewed: I have personally reviewed following labs and imaging studies  CBC:  Recent Labs Lab 06/12/16 1729 06/12/16 2254 06/13/16 0858  WBC 9.6 16.0* 6.7  NEUTROABS  --   --  4.3  HGB 14.9 14.4 12.1  HCT 43.5 42.7 35.6*  MCV 86.5 87.7 86.4  PLT 629* 519* 505*   Basic Metabolic  Panel:  Recent Labs Lab 06/12/16 1729 06/12/16 2254 06/13/16 0124 06/13/16 0657 06/13/16 0858 06/13/16 1356  NA 136 136 136 135  --  131*  K 4.2 4.0 3.7 4.2  --  4.2  CL 104 111 113* 112*  --  107  CO2 12* 10* 12* 14*  --  14*  GLUCOSE 178* 197* 165* 156*  --  198*  BUN 22* 17 17 17   --  12  CREATININE 1.04* 0.84 0.66 0.59  --  0.65  CALCIUM 9.4 8.4* 8.3* 8.5*  --  8.2*  MG  --  1.5*  --   --  1.4*  --   PHOS  --   --   --   --  2.1*  --    GFR: Estimated Creatinine Clearance: 88.2 mL/min (by C-G formula based on SCr of 0.65 mg/dL). Liver Function Tests:  Recent Labs Lab 06/12/16 1729  AST 11*  ALT 9*  ALKPHOS 116  BILITOT 1.1  PROT 8.6*  ALBUMIN 4.4    Recent Labs Lab 06/12/16 1729  LIPASE 14   No results for input(s): AMMONIA in the last 168 hours. Coagulation Profile: No results for input(s): INR, PROTIME in the last 168 hours. Cardiac Enzymes: No results for input(s): CKTOTAL, CKMB, CKMBINDEX, TROPONINI in the last 168 hours. BNP (last 3 results) No results for input(s): PROBNP in the last 8760 hours. HbA1C: No results for input(s): HGBA1C in the last 72 hours. CBG:  Recent Labs Lab 06/13/16 0715 06/13/16 0822 06/13/16 0911 06/13/16 1005 06/13/16 1206  GLUCAP 131* 199* 186* 174* 240*   Lipid Profile: No results for input(s): CHOL, HDL, LDLCALC, TRIG, CHOLHDL, LDLDIRECT in the last 72 hours. Thyroid Function Tests: No results for input(s): TSH, T4TOTAL, FREET4, T3FREE, THYROIDAB in the last 72 hours. Anemia Panel: No results for input(s): VITAMINB12, FOLATE, FERRITIN, TIBC, IRON, RETICCTPCT in the last 72 hours. Sepsis Labs:  Recent Labs Lab 06/13/16 0858  LATICACIDVEN 0.9    Recent Results (from the past 240 hour(s))  MRSA PCR Screening     Status: None   Collection Time: 06/03/16 10:55 PM  Result Value Ref Range Status   MRSA by PCR NEGATIVE NEGATIVE Final    Comment:        The GeneXpert MRSA  Assay (FDA approved for NASAL  specimens only), is one component of a comprehensive MRSA colonization surveillance program. It is not intended to diagnose MRSA infection nor to guide or monitor treatment for MRSA infections.   MRSA PCR Screening     Status: None   Collection Time: 06/12/16  9:38 PM  Result Value Ref Range Status   MRSA by PCR NEGATIVE NEGATIVE Final    Comment:        The GeneXpert MRSA Assay (FDA approved for NASAL specimens only), is one component of a comprehensive MRSA colonization surveillance program. It is not intended to diagnose MRSA infection nor to guide or monitor treatment for MRSA infections.     Radiology Studies: No results found.  Scheduled Meds: . enoxaparin (LOVENOX) injection  40 mg Subcutaneous Q24H  . famotidine  20 mg Oral QHS  . insulin aspart  0-9 Units Subcutaneous TID WC  . insulin aspart  4 Units Subcutaneous TID WC  . [START ON 06/14/2016] insulin glargine  13 Units Subcutaneous Daily  . metoCLOPramide  10 mg Oral TID AC  . metoprolol tartrate  12.5 mg Oral BID  . pantoprazole  40 mg Oral BID WC   Continuous Infusions: . sodium chloride    . cefTRIAXone (ROCEPHIN)  IV    . insulin (NOVOLIN-R) infusion Stopped (06/13/16 0636)  . potassium phosphate IVPB (mmol) 30 mmol (06/13/16 1300)    LOS: 1 day   Kerney Elbe, DO [facility] Pager [phone]  If 7PM-7AM, please contact night-coverage [email or web address] Password [email or web address] 06/13/2016, 2:52 PM

## 2016-06-13 NOTE — Progress Notes (Signed)
Patient has an order for the Diabetic Coordinator consult.  Called the operator and [facility] to find the number to consult the coordinator for Diabetes on the weekend.  There is no one available for Diabetes on the weekend.  Multiple calls and pages were sent and no response to pages. Patient to be transitioned in the next couple hours.  Copelan Maultsby Roselie Awkward RN

## 2016-06-14 LAB — URINE CULTURE

## 2016-06-14 LAB — CBC WITH DIFFERENTIAL/PLATELET
BASOS ABS: 0 10*3/uL (ref 0.0–0.1)
BASOS PCT: 0 %
EOS ABS: 0.2 10*3/uL (ref 0.0–0.7)
EOS PCT: 3 %
HCT: 32 % — ABNORMAL LOW (ref 36.0–46.0)
Hemoglobin: 10.8 g/dL — ABNORMAL LOW (ref 12.0–15.0)
Lymphocytes Relative: 42 %
Lymphs Abs: 2.1 10*3/uL (ref 0.7–4.0)
MCH: 28.9 pg (ref 26.0–34.0)
MCHC: 33.8 g/dL (ref 30.0–36.0)
MCV: 85.6 fL (ref 78.0–100.0)
MONO ABS: 0.5 10*3/uL (ref 0.1–1.0)
MONOS PCT: 10 %
NEUTROS ABS: 2.3 10*3/uL (ref 1.7–7.7)
Neutrophils Relative %: 45 %
PLATELETS: 389 10*3/uL (ref 150–400)
RBC: 3.74 MIL/uL — ABNORMAL LOW (ref 3.87–5.11)
RDW: 13.7 % (ref 11.5–15.5)
WBC: 5 10*3/uL (ref 4.0–10.5)

## 2016-06-14 LAB — COMPREHENSIVE METABOLIC PANEL
ALBUMIN: 2.8 g/dL — AB (ref 3.5–5.0)
ALT: 7 U/L — ABNORMAL LOW (ref 14–54)
ANION GAP: 8 (ref 5–15)
AST: 23 U/L (ref 15–41)
Alkaline Phosphatase: 64 U/L (ref 38–126)
BILIRUBIN TOTAL: 0.2 mg/dL — AB (ref 0.3–1.2)
BUN: 6 mg/dL (ref 6–20)
CHLORIDE: 113 mmol/L — AB (ref 101–111)
CO2: 17 mmol/L — ABNORMAL LOW (ref 22–32)
Calcium: 8.2 mg/dL — ABNORMAL LOW (ref 8.9–10.3)
Creatinine, Ser: 0.56 mg/dL (ref 0.44–1.00)
GFR calc Af Amer: >60 mL/min (ref >60)
GFR calc non Af Amer: >60 mL/min (ref >60)
GLUCOSE: 181 mg/dL — AB (ref 65–99)
POTASSIUM: 3.5 mmol/L (ref 3.5–5.1)
SODIUM: 138 mmol/L (ref 135–145)
TOTAL PROTEIN: 5.4 g/dL — AB (ref 6.5–8.1)

## 2016-06-14 LAB — GLUCOSE, CAPILLARY
GLUCOSE-CAPILLARY: 39 mg/dL — AB (ref 65–99)
GLUCOSE-CAPILLARY: 59 mg/dL — AB (ref 65–99)
Glucose-Capillary: 90 mg/dL (ref 65–99)
Glucose-Capillary: 263 mg/dL — ABNORMAL HIGH (ref 65–99)
Glucose-Capillary: 218 mg/dL — ABNORMAL HIGH (ref 65–99)

## 2016-06-14 LAB — MAGNESIUM: Magnesium: 1.6 mg/dL — ABNORMAL LOW (ref 1.7–2.4)

## 2016-06-14 LAB — PHOSPHORUS: Phosphorus: 2.6 mg/dL (ref 2.5–4.6)

## 2016-06-14 MED ORDER — PROMETHAZINE HCL 25 MG PO TABS: 25.0000 mg | ORAL_TABLET | Freq: Four times a day (QID) | ORAL | 0 refills | Status: DC | PRN

## 2016-06-14 MED ORDER — INSULIN ASPART 100 UNIT/ML ~~LOC~~ SOLN: 3.0000 [IU] | Freq: Three times a day (TID) | SUBCUTANEOUS | Status: DC

## 2016-06-14 MED ORDER — KETOROLAC TROMETHAMINE 10 MG PO TABS: 10.0000 mg | ORAL_TABLET | Freq: Four times a day (QID) | ORAL | 0 refills | Status: DC | PRN

## 2016-06-14 MED ORDER — INSULIN ASPART 100 UNIT/ML ~~LOC~~ SOLN
5.0000 [IU] | Freq: Once | SUBCUTANEOUS | Status: AC
  Administered 2016-06-14: 5 [IU] via SUBCUTANEOUS

## 2016-06-14 MED ORDER — MAGNESIUM SULFATE 2 GM/50ML IV SOLN
2.0000 g | Freq: Once | INTRAVENOUS | Status: AC
  Administered 2016-06-14: 2 g via INTRAVENOUS
  Filled 2016-06-14: qty 50

## 2016-06-14 MED ORDER — GLUCAGON (RDNA) 1 MG IJ KIT: 1.0000 mg | PACK | Freq: Once | INTRAMUSCULAR | 12 refills | Status: DC | PRN

## 2016-06-14 MED ORDER — INSULIN GLARGINE 100 UNIT/ML ~~LOC~~ SOLN: 13.0000 [IU] | Freq: Every day | SUBCUTANEOUS | 11 refills | Status: DC

## 2016-06-14 NOTE — Progress Notes (Signed)
Reviewed discharge information with patient. Answered all questions. patient able to teach back medications and reasons to contact MD/911. Patient verbalizes importance of PCP follow up appointment.  [name]. Brigitte Pulse, RN

## 2016-06-14 NOTE — Discharge Summary (Signed)
Physician Discharge Summary  [name] GYK:[id number] DOB: [birth year] DOA: 06/12/2016  PCP: Vicenta Aly, FNP  Admit date: 06/12/2016 Discharge date: 06/14/2016  Admitted From: Home Disposition:  Home  Recommendations for Outpatient Follow-up:  1. Follow up with PCP in 1-2 weeks 2. Follow up with Endocrinonlogy as an outpatient in 1-2 weeks 3. Please obtain CMP/CBC in one week 4. Follow up with Ambulatory Diabetic Education Coordinator  Home Health: No Equipment/Devices: None  Discharge Condition: Stable CODE STATUS: FULL CODE Diet recommendation: Heart Healthy / Carb Modified   Brief/Interim Summary: Stefanie Moreheadis a 26 y.o.femalewith history of Diabetes Mellitus Type 1 with history of gastroparesis, and multple admissions in the last 6 months presented to the ER with complaints of nausea vomiting. Patient also has been having crampy abdominal discomfort. Patient's symptoms started this morning. Denies any recent travel or sick contacts. Denies any diarrhea. Has had multiple episodes of vomiting.the ER despite giving antiemetics and fluids patient was still having nausea and vomiting. Patient was being admitted for early DKA Type 1 and N/V from Gastroparesis. She improved and was taken off the Insulin gtt and was able to tolerate Carb Modified diet with no issues. [facility]. Overnight she was hypoglycemic and BS dropped to 39. It was revealed that patient received 3 units at 2145 and then 5 units at [facility]. She improved with snacks and this AM was improved. She was deemed medically stable at this time and will be D/C'd on 13 units of Lantus and 1 unit of Novolog for every 10 grams of Carbs she eats. She was also written a script for a Glucagon Emergency Kit incase she has more nocturnal hypoglycemia. At this time she was deemed stable and is to follow up with PCP and with Endocrinology as an outpatient for further medication  adjustments and follow up with Blood Sugar Control.  Discharge Diagnoses:  Active Problems:   Asthma   Acute kidney injury (HCC)   Sinus tachycardia   Hypomagnesemia   Hypophosphatemia   Nausea and vomiting   Diabetic ketoacidosis without coma associated with type 1 diabetes mellitus ([place])   Diabetic gastroparesis (HCC)   Volume depletion   Uncontrolled type 1 diabetes mellitus with hyperglycemia (HCC)   Gastroparesis due to DM (HCC)   GERD (gastroesophageal reflux disease)   UTI (urinary tract infection)   Hyponatremia  Diabetic Ketoacidosis without coma associated with Type 1 diabetes mellitus, improved -Patient has had multiple admissions in the last 6 months -Given her low bicarb, AG, ketones in urine she meets criteria for DKA, though this is somewhat atypical given her relatively normal glucose (170s only). However, on review of her previous labs, she usually has an AG in the normal range and bicarb closer to 20 - Admitted to SDU and transferred to [facility] with Telemetry on 06/13/16 - Insulin drip stopped early yesterday AM prior to my shift - BMET was done q 4 hours and initiated long acting insulin once gap is closed (AG is now 8) -> Bicarb was 17  -Gave patient Diet as she wanted to eat and GAP was closed but Bicarb still not above 20 -Was started on IV Dilaudid for pain with 0.5 mg IV q4hprn for Severe Pain, will send out on po Ketorolac -Lactic Acid yesterday AM was 0.9  -VBG pending for pH assessment - ordered in the ED was cancelled; ABG was ordered and showed 7.287/25.6/107/11.9/98.2% -Unclear what Insulin Regimen patient was currently taking at home but sees  Endocrinology in [place] -Discussed with Diabetic Education Coordinator and Started patient on 13 units of Lantus, Novolog Sensitive SSI, and 4 units of Novolog with meals if patient eats >50% of her meal while hospitalized -Became Hypoglycemic Last night as she was given 8 units of Novolog too close together -Will  D/C on 13 units of Lantus Daily and with Novolog 1 unit for every 10 grams of Carbohydrates the patient eats -Wrote for Glucagon Emergency Kit at D/C -Will need to follow up with PCP and Endocrinology as an outpatient for further medication adjustments.   Acute Kidney Injury due to Volume depletion, improved -BUN/Cr 22/1.04  On Admission and now improved -Was Pre-renal. She normal has a BUN <5 -IVF were given -Most recent BUN/Cr was 6/0.56 -Repeat CMP as an outpatient  UTI (urinary tract infection) - Her UA does have leukocytes in it and TNTC WBC's, no nitrites, but had Many Bacteria - She has no urinary symptoms, so I am not sure she truly has a UTI but was suspected - Monitor for change in symptoms, she has received a dose of rocephin in the ED - Urine Culture showed Multiple Speices Present -Patient Received 3 days of IV Ceftriaxone and should be sufficient to cover an uncomplicated UTI  Nausea and Vomiting in the setting of Diabetic Gastroparesis, improved -Now on Carb Modified Diet and tolerating that well -Avoid Narcotics -Wrote script for Phenergan 25 mg po q6hprn for Nausea  -Resume Metoclopramide 10 mg po TID -C/w Home Famotidine and Protonix -Lipase normal at 14.   Asthma -Not in Exacerbation -C/w Home meds of Albuterol Inhaler, Symbicort, and Albuterol Nebs  Hypophosphatemia, improved -Patient's Phos Level was 2.1 and improved 2.6 -Repeat Phos Level as an outpatient   Hypomagnesemia -Patient's Mag Level this AM was 1.6 -Replete with IV Mag Sulfate 2 grams -Replete Mag Level as an outpatient  Leukocytosis, improved -Was likely reactive to DKA -Got 3 days of IV Ceftriaxone for suspected UTI -Improved and went from 16.0 -> 6.7 -> 5.0 -Repeat CBC as an outpatient and continue to monitor for S/Sx of Infection; (UTI less likely as she is asymptomatic and Urine Cx grew Multiple Species Present) -Follow up with PCP  Hyponatremia, improved -Most Recent BMP  showed Sodium of 138 -Was on NS at 75 mL/hr -Repeat CMP as an outpatient  Discharge Instructions  Discharge Instructions    Ambulatory referral to Nutrition and Diabetic Education    Complete by:  As directed    Call MD for:  difficulty breathing, headache or visual disturbances    Complete by:  As directed    Call MD for:  extreme fatigue    Complete by:  As directed    Call MD for:  hives    Complete by:  As directed    Call MD for:  persistant dizziness or light-headedness    Complete by:  As directed    Call MD for:  persistant nausea and vomiting    Complete by:  As directed    Call MD for:  severe uncontrolled pain    Complete by:  As directed    Call MD for:  temperature >100.4    Complete by:  As directed    Diet - low sodium heart healthy    Complete by:  As directed    Diet Carb Modified    Complete by:  As directed    Discharge instructions    Complete by:  As directed    Follow up with PCP  and Endocrinology. Take all medications as prescribed. If symptoms change or worsen please return to the ED for re-evaluation.   Increase activity slowly    Complete by:  As directed      Allergies as of 06/14/2016      Reactions   Peanut-containing Drug Products Swelling, Other (See Comments)   Reaction:  Swelling of mouth and lips    Food Swelling, Other (See Comments)   Pt is allergic to strawberries.   Reaction:  Swelling of mouth and lips    Ultram [tramadol] Itching      Medication List    STOP taking these medications   BASAGLAR KWIKPEN 100 UNIT/ML Sopn Replaced by:  insulin glargine 100 UNIT/ML injection   escitalopram 10 MG tablet Commonly known as:  LEXAPRO     TAKE these medications   albuterol 108 (90 Base) MCG/ACT inhaler Commonly known as:  PROVENTIL HFA;VENTOLIN HFA Inhale 2 puffs into the lungs every 6 (six) hours as needed for wheezing or shortness of breath.   albuterol (2.5 MG/3ML) 0.083% nebulizer solution Commonly known as:   PROVENTIL Take 2.5 mg by nebulization every 6 (six) hours as needed for wheezing or shortness of breath.   budesonide-formoterol 160-4.5 MCG/ACT inhaler Commonly known as:  SYMBICORT Inhale 2 puffs into the lungs 2 (two) times daily.   famotidine 20 MG tablet Commonly known as:  PEPCID Take 1 tablet (20 mg total) by mouth at bedtime.   glucagon 1 MG injection Commonly known as:  GLUCAGON EMERGENCY Inject 1 mg into the vein once as needed.   insulin glargine 100 UNIT/ML injection Commonly known as:  LANTUS Inject 0.13 mLs (13 Units total) into the skin daily. Start taking on:  06/15/2016 Replaces:  BASAGLAR KWIKPEN 100 UNIT/ML Sopn   ketorolac 10 MG tablet Commonly known as:  TORADOL Take 1 tablet (10 mg total) by mouth every 6 (six) hours as needed.   metoCLOPramide 10 MG tablet Commonly known as:  REGLAN Take 1 tablet (10 mg total) by mouth 3 (three) times daily before meals.   metoprolol tartrate 25 MG tablet Commonly known as:  LOPRESSOR Take 0.5 tablets (12.5 mg total) by mouth 2 (two) times daily.   NOVOLOG FLEXPEN 100 UNIT/ML FlexPen Generic drug:  insulin aspart Inject into the skin See admin instructions. Pt uses 1 unit for every 10 grams of carbs with meals and snacks for BS greater than 50.   pantoprazole 40 MG tablet Commonly known as:  PROTONIX Take 1 tablet (40 mg total) by mouth 2 (two) times daily.   promethazine 25 MG tablet Commonly known as:  PHENERGAN Take 1 tablet (25 mg total) by mouth every 6 (six) hours as needed for nausea or vomiting.   Vitamin D (Ergocalciferol) 50000 units Caps capsule Commonly known as:  DRISDOL Take 50,000 Units by mouth once a week.       Allergies  Allergen Reactions  . Peanut-Containing Drug Products Swelling and Other (See Comments)    Reaction:  Swelling of mouth and lips   . 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   Reaction:  Swelling of mouth and lips   . Ultram [Tramadol]  Itching   Consultations:  Diabetic Education Coordinatoor  Procedures/Studies: Dg Chest Port 1 View  Result Date: 06/03/2016 CLINICAL DATA:  Tachypnea and tachycardia. EXAM: PORTABLE CHEST 1 VIEW COMPARISON:  05/31/2016 and prior exams FINDINGS: The cardiomediastinal silhouette is unremarkable. There is no evidence of focal airspace  disease, pulmonary edema, suspicious pulmonary nodule/mass, pleural effusion, or pneumothorax. No acute bony abnormalities are identified. An NG tube is identified with tip overlying the mid stomach. IMPRESSION: NG tube with tip overlying the mid stomach. No evidence of acute cardiopulmonary disease. Electronically Signed   By: Margarette Canada M.D.   On: 06/03/2016 20:49   Dg Abd Acute W/chest  Result Date: 06/01/2016 CLINICAL DATA:  Nausea and vomiting. EXAM: DG ABDOMEN ACUTE W/ 1V CHEST COMPARISON:  Chest radiograph 05/14/2016, abdominal CT 02/20/2016 FINDINGS: Lower lung volumes from prior exam accentuating cardiac size. No consolidation or pleural fluid. No evidence pulmonary edema. Normal bowel gas pattern. No dilated bowel loops to suggest obstruction. Small to moderate stool burden. No evidence of free air. Cholecystectomy clips in the right upper quadrant. No radiopaque calculi. No osseous abnormalities. IMPRESSION: Normal bowel gas pattern.  No evidence of obstruction or free air. Low lung volumes without acute abnormality. Electronically Signed   By: Jeb Levering M.D.   On: 06/01/2016 00:32   Dg Abd Portable 1v  Result Date: 06/02/2016 CLINICAL DATA:  Evaluate NG tube placement EXAM: PORTABLE ABDOMEN - 1 VIEW COMPARISON:  May 31, 2016 FINDINGS: The side port of the NG tube terminates just below the GE junction. The distal tip is in the body of the stomach. IMPRESSION: NG tube placement as above. Electronically Signed   By: Dorise Bullion III M.D   On: 06/02/2016 18:44   Subjective: Seen and examined and was doing well. Had no nausea or vomiting and had no CP  or SOB and was tolerating food well.  Discharge Exam: Vitals:   06/14/16 0553 06/14/16 0953  BP: 107/69 119/84  Pulse: (!) 107 93  Resp: 17 17  Temp: 98.8 F (37.1 C) 98.8 F (37.1 C)   Vitals:   06/13/16 2124 06/14/16 0223 06/14/16 0553 06/14/16 0953  BP: 106/71 (!) 96/59 107/69 119/84  Pulse: (!) 105 99 (!) 107 93  Resp: _0 Temp: 98.4 F (36.9 C) 98.3 F (36.8 C) 98.8 F (37.1 C) 98.8 F (37.1 C)  TempSrc: Oral Oral Oral Oral  SpO2: 100% 100% 100% 100%  Weight:      Height:       General: Pt is alert, awake, not in acute distress Cardiovascular: RRR, S1/S2 +, no rubs, no gallops Respiratory: CTA bilaterally, no wheezing, no rhonchi, Patient was not tachypenic or using any accessory muscles to breathe.  Abdominal: Soft, NT, ND, bowel sounds + Extremities: no edema, no cyanosis  The results of significant diagnostics from this hospitalization (including imaging, microbiology, ancillary and laboratory) are listed below for reference.    Microbiology: Recent Results (from the past 240 hour(s))  Urine culture     Status: Abnormal   Collection Time: 06/12/16  5:29 PM  Result Value Ref Range Status   Specimen Description URINE, RANDOM  Final   Special Requests NONE  Final   Culture MULTIPLE SPECIES PRESENT, SUGGEST RECOLLECTION (A)  Final   Report Status 06/14/2016 FINAL  Final  MRSA PCR Screening     Status: None   Collection Time: 06/12/16  9:38 PM  Result Value Ref Range Status   MRSA by PCR NEGATIVE NEGATIVE Final    Comment:        The GeneXpert MRSA Assay (FDA approved for NASAL specimens only), is one component of a comprehensive MRSA colonization surveillance program. It is not intended to diagnose MRSA infection nor to guide or monitor treatment for MRSA infections.  Labs: BNP (last 3 results)  Recent Labs  07/11/15 0353  BNP 6.9   Basic Metabolic Panel:  Recent Labs Lab 06/12/16 2254 06/13/16 0124 06/13/16 0657 06/13/16 0858  06/13/16 1356 06/14/16 0541  NA 136 136 135  --  131* 138  K 4.0 3.7 4.2  --  4.2 3.5  CL 111 113* 112*  --  107 113*  CO2 10* 12* 14*  --  14* 17*  GLUCOSE 197* 165* 156*  --  198* 181*  BUN _0 --  12 6  CREATININE 0.84 0.66 0.59  --  0.65 0.56  CALCIUM 8.4* 8.3* 8.5*  --  8.2* 8.2*  MG 1.5*  --   --  1.4*  --  1.6*  PHOS  --   --   --  2.1*  --  2.6   Liver Function Tests:  Recent Labs Lab 06/12/16 1729 06/14/16 0541  AST 11* 23  ALT 9* 7*  ALKPHOS 116 64  BILITOT 1.1 0.2*  PROT 8.6* 5.4*  ALBUMIN 4.4 2.8*    Recent Labs Lab 06/12/16 1729  LIPASE 14   No results for input(s): AMMONIA in the last 168 hours. CBC:  Recent Labs Lab 06/12/16 1729 06/12/16 2254 06/13/16 0858 06/14/16 0541  WBC 9.6 16.0* 6.7 5.0  NEUTROABS  --   --  4.3 2.3  HGB 14.9 14.4 12.1 10.8*  HCT 43.5 42.7 35.6* 32.0*  MCV 86.5 87.7 86.4 85.6  PLT 629* 519* 421* 389   Cardiac Enzymes: No results for input(s): CKTOTAL, CKMB, CKMBINDEX, TROPONINI in the last 168 hours. BNP: Invalid input(s): POCBNP CBG:  Recent Labs Lab 06/14/16 0256 06/14/16 0317 06/14/16 0349 06/14/16 0755 06/14/16 1140  GLUCAP 39* 59* 90 263* 218*   D-Dimer No results for input(s): DDIMER in the last 72 hours. Hgb A1c No results for input(s): HGBA1C in the last 72 hours. Lipid Profile No results for input(s): CHOL, HDL, LDLCALC, TRIG, CHOLHDL, LDLDIRECT in the last 72 hours. Thyroid function studies No results for input(s): TSH, T4TOTAL, T3FREE, THYROIDAB in the last 72 hours.  Invalid input(s): FREET3 Anemia work up No results for input(s): VITAMINB12, FOLATE, FERRITIN, TIBC, IRON, RETICCTPCT in the last 72 hours. Urinalysis    Component Value Date/Time   COLORURINE YELLOW 06/12/2016 1729   APPEARANCEUR CLOUDY (A) 06/12/2016 1729   LABSPEC 1.013 06/12/2016 1729   PHURINE 6.0 06/12/2016 1729   GLUCOSEU >=500 (A) 06/12/2016 1729   GLUCOSEU >=1000 11/07/2012 1205   HGBUR SMALL (A)  06/12/2016 1729   BILIRUBINUR NEGATIVE 06/12/2016 1729   KETONESUR 80 (A) 06/12/2016 1729   PROTEINUR 100 (A) 06/12/2016 1729   UROBILINOGEN 0.2 11/24/2014 1045   NITRITE NEGATIVE 06/12/2016 1729   LEUKOCYTESUR LARGE (A) 06/12/2016 1729   Sepsis Labs Invalid input(s): PROCALCITONIN,  WBC,  LACTICIDVEN Microbiology Recent Results (from the past 240 hour(s))  Urine culture     Status: Abnormal   Collection Time: 06/12/16  5:29 PM  Result Value Ref Range Status   Specimen Description URINE, RANDOM  Final   Special Requests NONE  Final   Culture MULTIPLE SPECIES PRESENT, SUGGEST RECOLLECTION (A)  Final   Report Status 06/14/2016 FINAL  Final  MRSA PCR Screening     Status: None   Collection Time: 06/12/16  9:38 PM  Result Value Ref Range Status   MRSA by PCR NEGATIVE NEGATIVE Final    Comment:        The GeneXpert MRSA Assay (FDA approved for  NASAL specimens only), is one component of a comprehensive MRSA colonization surveillance program. It is not intended to diagnose MRSA infection nor to guide or monitor treatment for MRSA infections.    Time coordinating discharge: 35 minutes  SIGNED:  Kerney Elbe, DO [facility] 06/14/2016, 12:35 PM Pager [phone]  If 7PM-7AM, please contact night-coverage [email or web address] Password [email or web address]

## 2016-06-14 NOTE — Progress Notes (Signed)
Inpatient Diabetes Program Recommendations  AACE/ADA: New Consensus Statement on Inpatient Glycemic Control (2015)  Target Ranges:  Prepandial:   less than 140 mg/dL      Peak postprandial:   less than 180 mg/dL (1-2 hours)      Critically ill patients:  140 - 180 mg/dL   Lab Results  Component Value Date   GLUCAP 90 06/14/2016   HGBA1C 8.3 (H) 06/05/2016   Results for ZALEY, TALLEY (MRN [id number]) as of 06/14/2016 06:57  Ref. Range 06/13/2016 21:26 06/13/2016 23:57 06/14/2016 02:56 06/14/2016 03:17 06/14/2016 03:49  Glucose-Capillary Latest Ref Range: 65 - 99 mg/dL 223 (H) 220 (H) 39 (LL) 59 (L) 90   Review of Glycemic Control  Inpatient Diabetes Program Recommendations:    Noted hypoglycemia post Novolog 3 @ 2134 & 5 units @ 0013. Please consider: -Decrease Novolog meal coverage to 3 units if eats 50%  Thank you, Nani Gasser. Nate Common, RN, MSN, CDE  Diabetes Coordinator Inpatient Glycemic Control Team Team Pager [phone] (8am-5pm) 06/14/2016 7:02 AM

## 2016-06-14 NOTE — Progress Notes (Signed)
Hypoglycemic Event  CBG: 39  Treatment: 15 g carb snack  Symptoms: None  Follow-up CBG: Time:0350 CBG Result:90  Possible Reasons for Event: Medication regimen: No basal insulin ordered at night?   [name] I

## 2016-06-15 ENCOUNTER — Encounter (HOSPITAL_COMMUNITY): Payer: Self-pay | Admitting: Emergency Medicine

## 2016-06-15 ENCOUNTER — Emergency Department (HOSPITAL_COMMUNITY)
Admission: EM | Admit: 2016-06-15 | Discharge: 2016-06-15 | Disposition: A | Discharge status: Home / Self Care | Payer: Medicaid Other | Attending: Emergency Medicine | Admitting: Emergency Medicine

## 2016-06-15 DIAGNOSIS — I5032 Chronic diastolic (congestive) heart failure: Secondary | ICD-10-CM | POA: Insufficient documentation

## 2016-06-15 DIAGNOSIS — E109 Type 1 diabetes mellitus without complications: Secondary | ICD-10-CM | POA: Insufficient documentation

## 2016-06-15 DIAGNOSIS — Z91010 Allergy to peanuts: Secondary | ICD-10-CM | POA: Diagnosis not present

## 2016-06-15 DIAGNOSIS — Z79899 Other long term (current) drug therapy: Secondary | ICD-10-CM | POA: Diagnosis not present

## 2016-06-15 DIAGNOSIS — J45909 Unspecified asthma, uncomplicated: Secondary | ICD-10-CM | POA: Insufficient documentation

## 2016-06-15 DIAGNOSIS — K3184 Gastroparesis: Secondary | ICD-10-CM | POA: Diagnosis not present

## 2016-06-15 DIAGNOSIS — I110 Hypertensive heart disease with heart failure: Secondary | ICD-10-CM | POA: Insufficient documentation

## 2016-06-15 DIAGNOSIS — R1084 Generalized abdominal pain: Secondary | ICD-10-CM | POA: Diagnosis present

## 2016-06-15 LAB — COMPREHENSIVE METABOLIC PANEL
ALK PHOS: 89 U/L (ref 38–126)
ALT: 9 U/L — ABNORMAL LOW (ref 14–54)
ANION GAP: 11 (ref 5–15)
AST: 16 U/L (ref 15–41)
Albumin: 4 g/dL (ref 3.5–5.0)
BILIRUBIN TOTAL: 0.5 mg/dL (ref 0.3–1.2)
BUN: <5 mg/dL — ABNORMAL LOW (ref 6–20)
CALCIUM: 8.9 mg/dL (ref 8.9–10.3)
CO2: 25 mmol/L (ref 22–32)
CREATININE: 0.57 mg/dL (ref 0.44–1.00)
Chloride: 102 mmol/L (ref 101–111)
Glucose, Bld: 216 mg/dL — ABNORMAL HIGH (ref 65–99)
Potassium: 3.5 mmol/L (ref 3.5–5.1)
Sodium: 138 mmol/L (ref 135–145)
TOTAL PROTEIN: 7 g/dL (ref 6.5–8.1)

## 2016-06-15 LAB — I-STAT BETA HCG BLOOD, ED (MC, WL, AP ONLY): I-stat hCG, quantitative: <5 m[IU]/mL (ref <5)

## 2016-06-15 LAB — BLOOD GAS, VENOUS
Acid-Base Excess: 3.1 mmol/L — ABNORMAL HIGH (ref 0.0–2.0)
BICARBONATE: 27.7 mmol/L (ref 20.0–28.0)
O2 Saturation: 26.5 %
PH VEN: 7.416 (ref 7.250–7.430)
Patient temperature: 98.6
pCO2, Ven: 43.9 mmHg — ABNORMAL LOW (ref 44.0–60.0)

## 2016-06-15 LAB — LIPASE, BLOOD: Lipase: 14 U/L (ref 11–51)

## 2016-06-15 LAB — CBC
HCT: 36 % (ref 36.0–46.0)
HEMOGLOBIN: 12.5 g/dL (ref 12.0–15.0)
MCH: 29.6 pg (ref 26.0–34.0)
MCHC: 34.7 g/dL (ref 30.0–36.0)
MCV: 85.3 fL (ref 78.0–100.0)
PLATELETS: 397 10*3/uL (ref 150–400)
RBC: 4.22 MIL/uL (ref 3.87–5.11)
RDW: 13.6 % (ref 11.5–15.5)
WBC: 6.6 10*3/uL (ref 4.0–10.5)

## 2016-06-15 LAB — I-STAT CHEM 8, ED
CREATININE: 0.5 mg/dL (ref 0.44–1.00)
Calcium, Ion: 0.97 mmol/L — ABNORMAL LOW (ref 1.15–1.40)
Chloride: 101 mmol/L (ref 101–111)
Glucose, Bld: 221 mg/dL — ABNORMAL HIGH (ref 65–99)
HEMATOCRIT: 38 % (ref 36.0–46.0)
HEMOGLOBIN: 12.9 g/dL (ref 12.0–15.0)
POTASSIUM: 3.6 mmol/L (ref 3.5–5.1)
SODIUM: 137 mmol/L (ref 135–145)
TCO2: 22 mmol/L (ref 0–100)

## 2016-06-15 MED ORDER — PROMETHAZINE HCL 25 MG PO TABS: 25.0000 mg | ORAL_TABLET | Freq: Four times a day (QID) | ORAL | 0 refills | Status: DC | PRN

## 2016-06-15 MED ORDER — LACTATED RINGERS IV BOLUS (SEPSIS)
2000.0000 mL | Freq: Once | INTRAVENOUS | Status: AC
  Administered 2016-06-15: 2000 mL via INTRAVENOUS

## 2016-06-15 MED ORDER — HYDROMORPHONE HCL 1 MG/ML IJ SOLN
0.5000 mg | Freq: Once | INTRAMUSCULAR | Status: AC
  Administered 2016-06-15: 0.5 mg via INTRAVENOUS
  Filled 2016-06-15: qty 0.5

## 2016-06-15 MED ORDER — HALOPERIDOL LACTATE 5 MG/ML IJ SOLN
5.0000 mg | Freq: Once | INTRAMUSCULAR | Status: AC
  Administered 2016-06-15: 5 mg via INTRAVENOUS
  Filled 2016-06-15: qty 1

## 2016-06-15 MED ORDER — GI COCKTAIL ~~LOC~~
30.0000 mL | Freq: Once | ORAL | Status: DC
  Filled 2016-06-15: qty 30

## 2016-06-15 MED ORDER — PROMETHAZINE HCL 25 MG/ML IJ SOLN
25.0000 mg | Freq: Once | INTRAMUSCULAR | Status: AC
  Administered 2016-06-15: 25 mg via INTRAMUSCULAR
  Filled 2016-06-15: qty 1

## 2016-06-15 NOTE — ED Provider Notes (Signed)
[facility] DEPT Provider Note   CSN: [id number] Arrival date & time: 06/15/16  0730     History   Chief Complaint Chief Complaint  Patient presents with  . Abdominal Pain  . Emesis    HPI [name] is a 26 y.o. female.  26 yo F with a chief complaint of nausea vomiting and abdominal pain. Pain is diffuse. Crampy. Feels like her prior gastroparesis. Had a taco last night that she thinks caused this. She does not think this feels like her prior diabetic ketoacidosis. Denies fevers or chills. Denies diarrhea. Denies bilious or bloody emesis.   The history is provided by the patient.  Abdominal Pain   This is a recurrent problem. The current episode started 2 days ago. The problem occurs constantly. The problem has not changed since onset.The pain is associated with eating. The pain is located in the generalized abdominal region. The quality of the pain is sharp, shooting and burning. The pain is at a severity of 8/10. The pain is severe. Associated symptoms include nausea and vomiting. Pertinent negatives include fever, dysuria, headaches, arthralgias and myalgias. Nothing aggravates the symptoms. Nothing relieves the symptoms.  Emesis   Associated symptoms include abdominal pain. Pertinent negatives include no arthralgias, no chills, no fever, no headaches and no myalgias.    Past Medical History:  Diagnosis Date  . Anxiety   . Arthritis   . Asthma   . Diabetes mellitus 2007   IDDM.  poorly controlled, multiple admits with DKA  . Gallstones   . Gastroparesis   . Heart murmur   . Hepatic steatosis 11/26/2014   and hepatomegaly  . Hypertension    NOT CURRENTLY ON ANY BP MED  . Liver mass 11/26/2014  . Pancreatitis, acute 11/26/2014    Patient Active Problem List   Diagnosis Date Noted  . Hyponatremia 06/13/2016  . Hematuria 05/13/2016  . DKA, type 1, not at goal [facility]) 02/29/2016  . UTI (urinary tract infection) 01/22/2016  . Depression   . Diarrhea  11/09/2015  . Acute urinary retention   . Gastroparesis due to DM ([place]) 07/10/2015  . GERD (gastroesophageal reflux disease) 07/10/2015  . Depression with anxiety 07/10/2015  . Uncontrolled type 1 diabetes mellitus with hyperglycemia ([place])   . Gastroparesis 06/22/2015  . Altered mental status 06/22/2015  . Volume depletion 06/10/2015  . Protein-calorie malnutrition, severe 06/10/2015  . Hyperglycemia   . Intractable vomiting with nausea   . Elevated bilirubin   . Type 1 diabetes mellitus with hyperglycemia ([place]) 05/29/2015  . Hematemesis with nausea   . Intractable vomiting 05/28/2015  . Intractable nausea and vomiting 05/28/2015  . Abdominal pain in female   . Abdominal pain 05/24/2015  . Hypertension 05/24/2015  . Dehydration   . Diabetic gastroparesis ([place])   . Chronic diastolic heart failure ([place]) 04/11/2015  . Hematemesis 04/08/2015  . DKA (diabetic ketoacidoses) ([place]) 03/22/2015  . Nausea vomiting and diarrhea 03/22/2015  . Nausea & vomiting   . Diabetic ketoacidosis without coma associated with type 1 diabetes mellitus ([place])   . S/P laparoscopic cholecystectomy 02/11/2015  . Status post laparoscopic cholecystectomy 02/11/2015  . Postextubation stridor   . Nausea and vomiting 11/27/2014  . Pancreatitis, acute 11/26/2014  . Volume overload 11/26/2014  . Hypokalemia 11/26/2014  . Hepatic steatosis 11/26/2014  . Liver mass 11/26/2014  . Sepsis ([place]) 11/25/2014  . Sinus tachycardia 11/25/2014  . Hypomagnesemia 11/25/2014  . Hypophosphatemia 11/25/2014  . Elevated amylase and lipase 11/25/2014  . DKA, type  1 ([place]) 11/24/2014  . Elevated LFTs 11/24/2014  . Acute kidney injury ([place]) 11/24/2014  . Migraine headache 11/24/2014  . Asthma 06/29/2012  . Uncontrolled type 1 diabetes mellitus ([place]) 06/19/2010  . Goiter, unspecified 06/19/2010    Past Surgical History:  Procedure Laterality Date  . CHOLECYSTECTOMY N/A 02/11/2015   Procedure: LAPAROSCOPIC CHOLECYSTECTOMY  WITH INTRAOPERATIVE CHOLANGIOGRAM;  Surgeon: Greer Pickerel, MD;  Location: [facility] ORS;  Service: General;  Laterality: N/A;  . ESOPHAGOGASTRODUODENOSCOPY (EGD) WITH PROPOFOL Left 09/20/2014   Procedure: ESOPHAGOGASTRODUODENOSCOPY (EGD) WITH PROPOFOL;  Surgeon: Arta Silence, MD;  Location: [facility] ENDOSCOPY;  Service: Endoscopy;  Laterality: Left;  . WISDOM TOOTH EXTRACTION      OB History    Gravida Para Term Preterm AB Living   2 1 0 1 1 1    SAB TAB Ectopic Multiple Live Births   0 1 0 0 1       Home Medications    Prior to Admission medications   Medication Sig Start Date End Date Taking? Authorizing Provider  albuterol (PROVENTIL HFA;VENTOLIN HFA) 108 (90 BASE) MCG/ACT inhaler Inhale 2 puffs into the lungs every 6 (six) hours as needed for wheezing or shortness of breath.     [provider]  albuterol (PROVENTIL) (2.5 MG/3ML) 0.083% nebulizer solution Take 2.5 mg by nebulization every 6 (six) hours as needed for wheezing or shortness of breath.     [provider]  budesonide-formoterol (SYMBICORT) 160-4.5 MCG/ACT inhaler Inhale 2 puffs into the lungs 2 (two) times daily.     [provider]  famotidine (PEPCID) 20 MG tablet Take 1 tablet (20 mg total) by mouth at bedtime. 02/29/16   Antonietta Breach, PA-C  glucagon (GLUCAGON EMERGENCY) 1 MG injection Inject 1 mg into the vein once as needed. 06/14/16   Sheikh, Georgina Quint Latif, DO  insulin aspart (NOVOLOG FLEXPEN) 100 UNIT/ML FlexPen Inject into the skin See admin instructions. Pt uses 1 unit for every 10 grams of carbs with meals and snacks for BS greater than 50.    [provider]  insulin glargine (LANTUS) 100 UNIT/ML injection Inject 0.13 mLs (13 Units total) into the skin daily. 06/15/16   Raiford Noble Latif, DO  ketorolac (TORADOL) 10 MG tablet Take 1 tablet (10 mg total) by mouth every 6 (six) hours as needed. 06/14/16   Raiford Noble Latif, DO  metoCLOPramide (REGLAN) 10 MG tablet Take 1 tablet (10 mg total) by  mouth 3 (three) times daily before meals. 02/29/16   Antonietta Breach, PA-C  metoprolol tartrate (LOPRESSOR) 25 MG tablet Take 0.5 tablets (12.5 mg total) by mouth 2 (two) times daily. 05/10/16   Mariel Aloe, MD  pantoprazole (PROTONIX) 40 MG tablet Take 1 tablet (40 mg total) by mouth 2 (two) times daily. 02/25/16   Regalado, Jerald Kief A, MD  promethazine (PHENERGAN) 25 MG tablet Take 1 tablet (25 mg total) by mouth every 6 (six) hours as needed for nausea or vomiting. 06/15/16   Deno Etienne, DO  Vitamin D, Ergocalciferol, (DRISDOL) 50000 units CAPS capsule Take 50,000 Units by mouth once a week. 05/26/16   [provider]    Family History Family History  Problem Relation Age of Onset  . Heart disease Maternal Grandmother   . Heart disease Maternal Grandfather   . Diabetes Mother   . Hyperlipidemia Mother   . Hypertension Father   . Heart disease Father   . Hypertension Paternal Grandmother   . Cancer Paternal Grandfather     Social History Social  History  Substance Use Topics  . Smoking status: Never Smoker  . Smokeless tobacco: Never Used  . Alcohol use No     Allergies   Peanut-containing drug products; Food; and Ultram [tramadol]   Review of Systems Review of Systems  Constitutional: Negative for chills and fever.  HENT: Negative for congestion and rhinorrhea.   Eyes: Negative for redness and visual disturbance.  Respiratory: Negative for shortness of breath and wheezing.   Cardiovascular: Negative for chest pain and palpitations.  Gastrointestinal: Positive for abdominal pain, nausea and vomiting.  Genitourinary: Negative for dysuria and urgency.  Musculoskeletal: Negative for arthralgias and myalgias.  Skin: Negative for pallor and wound.  Neurological: Negative for dizziness and headaches.     Physical Exam Updated Vital Signs BP 112/68 (BP Location: Left Arm)   Pulse (!) 122   Temp 98.6 F (37 C) (Oral)   Resp 18   Ht 5\' 4"  (1.626 m)   Wt 52.2 kg (115  lb)   LMP 06/08/2016   SpO2 99%   BMI 19.74 kg/m   Physical Exam  Constitutional: She is oriented to person, place, and time. She appears well-developed and well-nourished. No distress.  HENT:  Head: Normocephalic and atraumatic.  Eyes: EOM are normal. Pupils are equal, round, and reactive to light.  Neck: Normal range of motion. Neck supple.  Cardiovascular: Normal rate and regular rhythm.  Exam reveals no gallop and no friction rub.   No murmur heard. Pulmonary/Chest: Effort normal. She has no wheezes. She has no rales.  Abdominal: Soft. She exhibits no distension and no mass. There is tenderness (diffuse, mild). There is no guarding.  Musculoskeletal: She exhibits no edema or tenderness.  Neurological: She is alert and oriented to person, place, and time.  Skin: Skin is warm and dry. She is not diaphoretic.  Psychiatric: She has a normal mood and affect. Her behavior is normal.  Nursing note and vitals reviewed.    ED Treatments / Results  Labs (all labs ordered are listed, but only abnormal results are displayed) Labs Reviewed  COMPREHENSIVE METABOLIC PANEL - Abnormal; Notable for the following:       Result Value   Glucose, Bld 216 (*)    BUN <5 (*)    ALT 9 (*)    All other components within normal limits  BLOOD GAS, VENOUS - Abnormal; Notable for the following:    pCO2, Ven 43.9 (*)    Acid-Base Excess 3.1 (*)    All other components within normal limits  I-STAT CHEM 8, ED - Abnormal; Notable for the following:    BUN <3 (*)    Glucose, Bld 221 (*)    Calcium, Ion 0.97 (*)    All other components within normal limits  LIPASE, BLOOD  CBC  URINALYSIS, ROUTINE W REFLEX MICROSCOPIC  I-STAT BETA HCG BLOOD, ED ([facility], [facility], [facility] ONLY)    EKG  EKG Interpretation None       Radiology No results found.  Procedures Procedures (including critical care time)  Medications Ordered in ED Medications  gi cocktail (Maalox,Lidocaine,Donnatal) (30 mLs Oral Not Given  06/15/16 0933)  lactated ringers bolus 2,000 mL (0 mLs Intravenous Stopped 06/15/16 1134)  promethazine (PHENERGAN) injection 25 mg (25 mg Intramuscular Given 06/15/16 1011)  haloperidol lactate (HALDOL) injection 5 mg (5 mg Intravenous Given 06/15/16 0936)  HYDROmorphone (DILAUDID) injection 0.5 mg (0.5 mg Intravenous Given 06/15/16 0932)     Initial Impression / Assessment and Plan / ED Course  I  have reviewed the triage vital signs and the nursing notes.  Pertinent labs & imaging results that were available during my care of the patient were reviewed by me and considered in my medical decision making (see chart for details).     26 yo F With a chief complaint of nausea and vomiting. Patient has a history of multiple episodes of DKA. Will obtain labs to evaluate for that. She also has poor gastric motility and history of gastroparesis. We'll give Haldol Phenergan fluids reassess.  The patient is feeling much better on reassessment and requesting discharge home. On discharge vital signs she was noted to be tachycardic. This is the first time during her visit that she was. She continues to be nontoxic has no significant abdominal pain. Patient is requesting discharge at this time.  4:02 PM:  I have discussed the diagnosis/risks/treatment options with the patient and family and believe the pt to be eligible for discharge home to follow-up with PCP. We also discussed returning to the ED immediately if new or worsening sx occur. We discussed the sx which are most concerning (e.g., sudden worsening pain, fever, inability to tolerate by mouth) that necessitate immediate return. Medications administered to the patient during their visit and any new prescriptions provided to the patient are listed below.  Medications given during this visit Medications  gi cocktail (Maalox,Lidocaine,Donnatal) (30 mLs Oral Not Given 06/15/16 0933)  lactated ringers bolus 2,000 mL (0 mLs Intravenous Stopped 06/15/16 1134)    promethazine (PHENERGAN) injection 25 mg (25 mg Intramuscular Given 06/15/16 1011)  haloperidol lactate (HALDOL) injection 5 mg (5 mg Intravenous Given 06/15/16 0936)  HYDROmorphone (DILAUDID) injection 0.5 mg (0.5 mg Intravenous Given 06/15/16 0932)     The patient appears reasonably screen and/or stabilized for discharge and I doubt any other medical condition or other [facility] requiring further screening, evaluation, or treatment in the ED at this time prior to discharge.    Final Clinical Impressions(s) / ED Diagnoses   Final diagnoses:  Gastroparesis    New Prescriptions Discharge Medication List as of 06/15/2016 11:46 AM       Deno Etienne, DO 06/15/16 4431

## 2016-06-15 NOTE — ED Notes (Signed)
AWARE OF NEED FOR URINE SAMPLE 

## 2016-06-15 NOTE — ED Notes (Signed)
Pt offered zofran per available protocol; pt verbalizes takes phenergan for n/v.

## 2016-06-15 NOTE — ED Triage Notes (Signed)
Pt verbalizes recently discharged for gastroparesis; pt verbalizes at at taco last night resulting in severe generalized abd pain with associated n/v.

## 2016-06-15 NOTE — ED Notes (Signed)
Pt. Unable to urinate at this time. Will collect urine will pt. Voids.

## 2016-06-15 NOTE — ED Notes (Signed)
PT DOES NOT DEEN URINE SAMPLE. EDP MADE AWARE OF ELEVATE HEART RATE AT DISCHARGE. PT STATES THIS IS NORMAL FOR HER. PT IS NOT SYMPTOMATIC AND HAS NOT HAS ANY EMESIS SINCE MEDICATED. PT STATS SHE IS READY FOR DISCHARGE. PT GIVEN RX PHENERGAN AND DIET PLAN. UNDERSTAND REASONS TO RETURN. [place]

## 2016-06-16 ENCOUNTER — Encounter (HOSPITAL_COMMUNITY): Payer: Self-pay | Admitting: Emergency Medicine

## 2016-06-16 ENCOUNTER — Emergency Department (HOSPITAL_COMMUNITY)
Admission: EM | Admit: 2016-06-16 | Discharge: 2016-06-16 | Disposition: A | Discharge status: Home / Self Care | Payer: Medicaid Other | Attending: Emergency Medicine | Admitting: Emergency Medicine

## 2016-06-16 DIAGNOSIS — I5032 Chronic diastolic (congestive) heart failure: Secondary | ICD-10-CM | POA: Diagnosis not present

## 2016-06-16 DIAGNOSIS — Z91010 Allergy to peanuts: Secondary | ICD-10-CM | POA: Diagnosis not present

## 2016-06-16 DIAGNOSIS — K3184 Gastroparesis: Secondary | ICD-10-CM | POA: Diagnosis not present

## 2016-06-16 DIAGNOSIS — E109 Type 1 diabetes mellitus without complications: Secondary | ICD-10-CM | POA: Diagnosis not present

## 2016-06-16 DIAGNOSIS — I110 Hypertensive heart disease with heart failure: Secondary | ICD-10-CM | POA: Diagnosis not present

## 2016-06-16 DIAGNOSIS — Z79899 Other long term (current) drug therapy: Secondary | ICD-10-CM | POA: Insufficient documentation

## 2016-06-16 DIAGNOSIS — J45909 Unspecified asthma, uncomplicated: Secondary | ICD-10-CM | POA: Diagnosis not present

## 2016-06-16 DIAGNOSIS — R112 Nausea with vomiting, unspecified: Secondary | ICD-10-CM | POA: Diagnosis present

## 2016-06-16 LAB — COMPREHENSIVE METABOLIC PANEL
ALBUMIN: 3.9 g/dL (ref 3.5–5.0)
ALT: 13 U/L — AB (ref 14–54)
AST: 28 U/L (ref 15–41)
Alkaline Phosphatase: 85 U/L (ref 38–126)
Anion gap: 11 (ref 5–15)
CHLORIDE: 101 mmol/L (ref 101–111)
CO2: 27 mmol/L (ref 22–32)
CREATININE: 0.53 mg/dL (ref 0.44–1.00)
Calcium: 9.1 mg/dL (ref 8.9–10.3)
GFR calc Af Amer: >60 mL/min (ref >60)
GFR calc non Af Amer: >60 mL/min (ref >60)
GLUCOSE: 190 mg/dL — AB (ref 65–99)
POTASSIUM: 3.6 mmol/L (ref 3.5–5.1)
Sodium: 139 mmol/L (ref 135–145)
Total Bilirubin: 0.4 mg/dL (ref 0.3–1.2)
Total Protein: 7 g/dL (ref 6.5–8.1)

## 2016-06-16 LAB — CBC
HEMATOCRIT: 35.9 % — AB (ref 36.0–46.0)
Hemoglobin: 12.2 g/dL (ref 12.0–15.0)
MCH: 29.3 pg (ref 26.0–34.0)
MCHC: 34 g/dL (ref 30.0–36.0)
MCV: 86.3 fL (ref 78.0–100.0)
PLATELETS: 457 10*3/uL — AB (ref 150–400)
RBC: 4.16 MIL/uL (ref 3.87–5.11)
RDW: 13.5 % (ref 11.5–15.5)
WBC: 6.3 10*3/uL (ref 4.0–10.5)

## 2016-06-16 LAB — URINALYSIS, ROUTINE W REFLEX MICROSCOPIC
BILIRUBIN URINE: NEGATIVE
Glucose, UA: >=500 mg/dL — AB
Hgb urine dipstick: NEGATIVE
Ketones, ur: 20 mg/dL — AB
Nitrite: NEGATIVE
PH: 9 — AB (ref 5.0–8.0)
Protein, ur: NEGATIVE mg/dL
SPECIFIC GRAVITY, URINE: 1.008 (ref 1.005–1.030)

## 2016-06-16 LAB — I-STAT BETA HCG BLOOD, ED (MC, WL, AP ONLY)

## 2016-06-16 LAB — LIPASE, BLOOD: LIPASE: 13 U/L (ref 11–51)

## 2016-06-16 MED ORDER — ONDANSETRON HCL 4 MG/2ML IJ SOLN
4.0000 mg | Freq: Once | INTRAMUSCULAR | Status: AC
  Administered 2016-06-16: 4 mg via INTRAVENOUS
  Filled 2016-06-16: qty 2

## 2016-06-16 MED ORDER — FAMOTIDINE IN NACL 20-0.9 MG/50ML-% IV SOLN
20.0000 mg | Freq: Once | INTRAVENOUS | Status: AC
  Administered 2016-06-16: 20 mg via INTRAVENOUS
  Filled 2016-06-16: qty 50

## 2016-06-16 MED ORDER — HYDROMORPHONE HCL 1 MG/ML IJ SOLN
0.5000 mg | Freq: Once | INTRAMUSCULAR | Status: AC
  Administered 2016-06-16: 0.5 mg via INTRAVENOUS
  Filled 2016-06-16: qty 0.5

## 2016-06-16 MED ORDER — HALOPERIDOL LACTATE 5 MG/ML IJ SOLN
4.0000 mg | Freq: Once | INTRAMUSCULAR | Status: AC
  Administered 2016-06-16: 4 mg via INTRAVENOUS
  Filled 2016-06-16: qty 1

## 2016-06-16 MED ORDER — KETOROLAC TROMETHAMINE 15 MG/ML IJ SOLN
15.0000 mg | Freq: Once | INTRAMUSCULAR | Status: AC
  Administered 2016-06-16: 15 mg via INTRAVENOUS
  Filled 2016-06-16: qty 1

## 2016-06-16 MED ORDER — METOCLOPRAMIDE HCL 5 MG/ML IJ SOLN
10.0000 mg | Freq: Once | INTRAMUSCULAR | Status: AC
  Administered 2016-06-16: 10 mg via INTRAVENOUS
  Filled 2016-06-16: qty 2

## 2016-06-16 MED ORDER — SODIUM CHLORIDE 0.9 % IV BOLUS (SEPSIS)
1000.0000 mL | Freq: Once | INTRAVENOUS | Status: AC
  Administered 2016-06-16: 1000 mL via INTRAVENOUS

## 2016-06-16 MED ORDER — ONDANSETRON HCL 4 MG/2ML IJ SOLN
4.0000 mg | Freq: Once | INTRAMUSCULAR | Status: DC | PRN
  Filled 2016-06-16: qty 2

## 2016-06-16 NOTE — ED Notes (Signed)
Provided a warm blanklet.

## 2016-06-16 NOTE — ED Notes (Signed)
[place], RN is going to attempt an ultrasound IV.

## 2016-06-16 NOTE — ED Notes (Signed)
Attempted an IV x 2 with no success.  

## 2016-06-16 NOTE — ED Notes (Signed)
Patient has been able to keep some ginger ale down without vomiting. Pt was provided saltine crackers at her request.

## 2016-06-16 NOTE — ED Notes (Signed)
[place], RN at bedside, attempting an ultrasound IV.

## 2016-06-16 NOTE — ED Triage Notes (Signed)
Patient c/o gastroparesis that started today with vomiting

## 2016-06-16 NOTE — ED Provider Notes (Signed)
[facility] DEPT Provider Note   CSN: [id number] Arrival date & time: 06/16/16  1647   History   Chief Complaint Chief Complaint  Patient presents with  . Abdominal Pain  . Emesis    HPI [name] is a 26 y.o. female who presents with abdominal pain, N/V. PMH significant for Type 1 DM, gastroparesis, and multple admissions in the last 6 months. She was most recently admitted from 5/25-5/27 and was last seen yesterday in the ED. After fluids and medicine yesterday she was feeling better and wanted to go home. Last night and this morning she felt fine. Her mother called her at [place] today and she was in acute distress. She reports pain in her "entire body". She had multiple episodes of N/V. CBG at home was 80-90s. She currently takes Lantus 13 units. No fever, chills, diarrhea, dysuria, vaginal discharge.  HPI  Past Medical History:  Diagnosis Date  . Anxiety   . Arthritis   . Asthma   . Diabetes mellitus 2007   IDDM.  poorly controlled, multiple admits with DKA  . Gallstones   . Gastroparesis   . Heart murmur   . Hepatic steatosis 11/26/2014   and hepatomegaly  . Hypertension    NOT CURRENTLY ON ANY BP MED  . Liver mass 11/26/2014  . Pancreatitis, acute 11/26/2014    Patient Active Problem List   Diagnosis Date Noted  . Hyponatremia 06/13/2016  . Hematuria 05/13/2016  . UTI (urinary tract infection) 01/22/2016  . Diarrhea 11/09/2015  . Acute urinary retention   . GERD (gastroesophageal reflux disease) 07/10/2015  . Depression with anxiety 07/10/2015  . Gastroparesis 06/22/2015  . Altered mental status 06/22/2015  . Volume depletion 06/10/2015  . Protein-calorie malnutrition, severe 06/10/2015  . Hyperglycemia   . Elevated bilirubin   . Hematemesis with nausea   . Intractable nausea and vomiting 05/28/2015  . Abdominal pain in female   . Hypertension 05/24/2015  . Dehydration   . Chronic diastolic heart failure ([place]) 04/11/2015  . Hematemesis 04/08/2015    . DKA (diabetic ketoacidoses) ([place]) 03/22/2015  . S/P laparoscopic cholecystectomy 02/11/2015  . Postextubation stridor   . Pancreatitis, acute 11/26/2014  . Volume overload 11/26/2014  . Hypokalemia 11/26/2014  . Hepatic steatosis 11/26/2014  . Liver mass 11/26/2014  . Sepsis ([place]) 11/25/2014  . Sinus tachycardia 11/25/2014  . Hypomagnesemia 11/25/2014  . Hypophosphatemia 11/25/2014  . Elevated LFTs 11/24/2014  . Acute kidney injury ([place]) 11/24/2014  . Migraine headache 11/24/2014  . Asthma 06/29/2012  . Uncontrolled type 1 diabetes mellitus ([place]) 06/19/2010  . Goiter, unspecified 06/19/2010    Past Surgical History:  Procedure Laterality Date  . CHOLECYSTECTOMY N/A 02/11/2015   Procedure: LAPAROSCOPIC CHOLECYSTECTOMY WITH INTRAOPERATIVE CHOLANGIOGRAM;  Surgeon: Greer Pickerel, MD;  Location: [facility] ORS;  Service: General;  Laterality: N/A;  . ESOPHAGOGASTRODUODENOSCOPY (EGD) WITH PROPOFOL Left 09/20/2014   Procedure: ESOPHAGOGASTRODUODENOSCOPY (EGD) WITH PROPOFOL;  Surgeon: Arta Silence, MD;  Location: [facility] ENDOSCOPY;  Service: Endoscopy;  Laterality: Left;  . WISDOM TOOTH EXTRACTION      OB History    Gravida Para Term Preterm AB Living   2 1 0 1 1 1    SAB TAB Ectopic Multiple Live Births   0 1 0 0 1       Home Medications    Prior to Admission medications   Medication Sig Start Date End Date Taking? Authorizing Provider  albuterol (PROVENTIL HFA;VENTOLIN HFA) 108 (90 BASE) MCG/ACT inhaler Inhale 2 puffs into the lungs every  6 (six) hours as needed for wheezing or shortness of breath.     [provider]  albuterol (PROVENTIL) (2.5 MG/3ML) 0.083% nebulizer solution Take 2.5 mg by nebulization every 6 (six) hours as needed for wheezing or shortness of breath.     [provider]  budesonide-formoterol (SYMBICORT) 160-4.5 MCG/ACT inhaler Inhale 2 puffs into the lungs 2 (two) times daily.     [provider]  famotidine (PEPCID) 20 MG tablet Take 1  tablet (20 mg total) by mouth at bedtime. 02/29/16   Antonietta Breach, PA-C  glucagon (GLUCAGON EMERGENCY) 1 MG injection Inject 1 mg into the vein once as needed. 06/14/16   Sheikh, Georgina Quint Latif, DO  insulin aspart (NOVOLOG FLEXPEN) 100 UNIT/ML FlexPen Inject into the skin See admin instructions. Pt uses 1 unit for every 10 grams of carbs with meals and snacks for BS greater than 50.    [provider]  insulin glargine (LANTUS) 100 UNIT/ML injection Inject 0.13 mLs (13 Units total) into the skin daily. 06/15/16   Raiford Noble Latif, DO  ketorolac (TORADOL) 10 MG tablet Take 1 tablet (10 mg total) by mouth every 6 (six) hours as needed. 06/14/16   Raiford Noble Latif, DO  metoCLOPramide (REGLAN) 10 MG tablet Take 1 tablet (10 mg total) by mouth 3 (three) times daily before meals. 02/29/16   Antonietta Breach, PA-C  metoprolol tartrate (LOPRESSOR) 25 MG tablet Take 0.5 tablets (12.5 mg total) by mouth 2 (two) times daily. 05/10/16   Mariel Aloe, MD  pantoprazole (PROTONIX) 40 MG tablet Take 1 tablet (40 mg total) by mouth 2 (two) times daily. 02/25/16   Regalado, Jerald Kief A, MD  promethazine (PHENERGAN) 25 MG tablet Take 1 tablet (25 mg total) by mouth every 6 (six) hours as needed for nausea or vomiting. 06/15/16   Deno Etienne, DO  Vitamin D, Ergocalciferol, (DRISDOL) 50000 units CAPS capsule Take 50,000 Units by mouth once a week. 05/26/16   [provider]    Family History Family History  Problem Relation Age of Onset  . Heart disease Maternal Grandmother   . Heart disease Maternal Grandfather   . Diabetes Mother   . Hyperlipidemia Mother   . Hypertension Father   . Heart disease Father   . Hypertension Paternal Grandmother   . Cancer Paternal Grandfather     Social History Social History  Substance Use Topics  . Smoking status: Never Smoker  . Smokeless tobacco: Never Used  . Alcohol use No     Allergies   Peanut-containing drug products; Food; and Ultram [tramadol]   Review  of Systems Review of Systems  Constitutional: Negative for chills and fever.  Gastrointestinal: Positive for abdominal pain, nausea and vomiting. Negative for constipation and diarrhea.  Genitourinary: Negative for dysuria, vaginal bleeding and vaginal discharge.  All other systems reviewed and are negative.    Physical Exam Updated Vital Signs BP (!) 133/96 (BP Location: Right Arm)   Pulse (!) 123   Temp 98.1 F (36.7 C) (Oral)   Resp 18   LMP 06/08/2016   SpO2 100%   Physical Exam  Constitutional: She is oriented to person, place, and time. She appears well-developed and well-nourished. She appears distressed (writhing in bed).  HENT:  Head: Normocephalic and atraumatic.  Eyes: Conjunctivae are normal. Pupils are equal, round, and reactive to light. Right eye exhibits no discharge. Left eye exhibits no discharge. No scleral icterus.  Neck: Normal range of motion.  Cardiovascular: Tachycardia present.  Exam  reveals no gallop and no friction rub.   No murmur heard. Pulmonary/Chest: Effort normal and breath sounds normal. No respiratory distress. She has no wheezes. She has no rales. She exhibits no tenderness.  Abdominal: Soft. Bowel sounds are normal. She exhibits no distension and no mass. There is tenderness (diffuse tenderness). There is no rebound and no guarding. No hernia.  Neurological: She is alert and oriented to person, place, and time.  Skin: Skin is warm and dry.  Psychiatric: She has a normal mood and affect. Her behavior is normal.  Nursing note and vitals reviewed.    ED Treatments / Results  Labs (all labs ordered are listed, but only abnormal results are displayed) Labs Reviewed  COMPREHENSIVE METABOLIC PANEL - Abnormal; Notable for the following:       Result Value   Glucose, Bld 190 (*)    BUN <5 (*)    ALT 13 (*)    All other components within normal limits  CBC - Abnormal; Notable for the following:    HCT 35.9 (*)    Platelets 457 (*)    All  other components within normal limits  URINALYSIS, ROUTINE W REFLEX MICROSCOPIC - Abnormal; Notable for the following:    APPearance HAZY (*)    pH 9.0 (*)    Glucose, UA >=500 (*)    Ketones, ur 20 (*)    Leukocytes, UA LARGE (*)    Bacteria, UA MANY (*)    Squamous Epithelial / LPF 0-5 (*)    All other components within normal limits  URINE CULTURE  LIPASE, BLOOD  I-STAT BETA HCG BLOOD, ED ([facility], [facility], [facility] ONLY)    EKG  EKG Interpretation None       Radiology No results found.  Procedures Procedures (including critical care time)  Medications Ordered in ED Medications  sodium chloride 0.9 % bolus 1,000 mL (0 mLs Intravenous Stopped 06/16/16 2129)  metoCLOPramide (REGLAN) injection 10 mg (10 mg Intravenous Given 06/16/16 1812)  haloperidol lactate (HALDOL) injection 4 mg (4 mg Intravenous Given 06/16/16 1814)  HYDROmorphone (DILAUDID) injection 0.5 mg (0.5 mg Intravenous Given 06/16/16 1946)  famotidine (PEPCID) IVPB 20 mg premix (0 mg Intravenous Stopped 06/16/16 2025)  ondansetron (ZOFRAN) injection 4 mg (4 mg Intravenous Given 06/16/16 1945)  ketorolac (TORADOL) 15 MG/ML injection 15 mg (15 mg Intravenous Given 06/16/16 2129)     Initial Impression / Assessment and Plan / ED Course  I have reviewed the triage vital signs and the nursing notes.  Pertinent labs & imaging results that were available during my care of the patient were reviewed by me and considered in my medical decision making (see chart for details).  26 year old female with generalized abdominal pain, N/V. She is tachycardic on arrival and hypertensive. Otherwise vitals are normal. She is in distress and initial exam is limited. Will start fluids, haldol, reglan and reassess.  7:13 PM CBC unremarkable. CMP remarkable for hyperglycemia. Anion gap is normal and bicarb is normal. Likely this represents gastroparesis flare. On recheck, she is still in significant pain but is calmer. Will give Dilaudid, pepcid, zofran  and reassess.   7:24 PM UA shows many bacteria, >500 glucose, 20 ketones, large leukocytes, TNTC WBC. Culture sent. She was just treated for UTI 4 days ago and had 3 doses of Rocephin. She improved symptomatically and has never had any urinary symptoms. Will hold off on antibiotics at this time.  On recheck, pt is feeling better. She is asking for crackers  and tolerated PO challenge. She has phenergan and reglan at home. Will d/c with return precautions.   Final Clinical Impressions(s) / ED Diagnoses   Final diagnoses:  Gastroparesis    New Prescriptions New Prescriptions   No medications on file     Iris Pert 06/17/16 1134    Charlesetta Shanks, MD 06/19/16 1746

## 2016-06-16 NOTE — ED Notes (Signed)
Provided patient ginger ale with ice for PO challenge.

## 2016-06-16 NOTE — Discharge Instructions (Signed)
Continue Reglan with every meal Return for worsening symptoms

## 2016-06-18 LAB — URINE CULTURE

## 2016-06-23 ENCOUNTER — Encounter (HOSPITAL_COMMUNITY): Payer: Self-pay | Admitting: Emergency Medicine

## 2016-06-23 ENCOUNTER — Inpatient Hospital Stay (HOSPITAL_COMMUNITY)
Admission: EM | Admit: 2016-06-23 | Discharge: 2016-06-28 | DRG: 638 | Disposition: A | Discharge status: Home / Self Care | Payer: Medicaid Other | Attending: Internal Medicine | Admitting: Internal Medicine

## 2016-06-23 DIAGNOSIS — K219 Gastro-esophageal reflux disease without esophagitis: Secondary | ICD-10-CM | POA: Diagnosis present

## 2016-06-23 DIAGNOSIS — I5032 Chronic diastolic (congestive) heart failure: Secondary | ICD-10-CM | POA: Diagnosis present

## 2016-06-23 DIAGNOSIS — E1010 Type 1 diabetes mellitus with ketoacidosis without coma: Secondary | ICD-10-CM | POA: Diagnosis present

## 2016-06-23 DIAGNOSIS — B3749 Other urogenital candidiasis: Secondary | ICD-10-CM

## 2016-06-23 DIAGNOSIS — Z91010 Allergy to peanuts: Secondary | ICD-10-CM

## 2016-06-23 DIAGNOSIS — Z888 Allergy status to other drugs, medicaments and biological substances status: Secondary | ICD-10-CM | POA: Diagnosis not present

## 2016-06-23 DIAGNOSIS — D649 Anemia, unspecified: Secondary | ICD-10-CM | POA: Diagnosis present

## 2016-06-23 DIAGNOSIS — K3184 Gastroparesis: Secondary | ICD-10-CM | POA: Diagnosis present

## 2016-06-23 DIAGNOSIS — I10 Essential (primary) hypertension: Secondary | ICD-10-CM | POA: Diagnosis present

## 2016-06-23 DIAGNOSIS — E1043 Type 1 diabetes mellitus with diabetic autonomic (poly)neuropathy: Secondary | ICD-10-CM | POA: Diagnosis present

## 2016-06-23 DIAGNOSIS — E860 Dehydration: Secondary | ICD-10-CM | POA: Diagnosis present

## 2016-06-23 DIAGNOSIS — G43A1 Cyclical vomiting, intractable: Secondary | ICD-10-CM | POA: Diagnosis not present

## 2016-06-23 DIAGNOSIS — F419 Anxiety disorder, unspecified: Secondary | ICD-10-CM | POA: Diagnosis present

## 2016-06-23 DIAGNOSIS — J45909 Unspecified asthma, uncomplicated: Secondary | ICD-10-CM | POA: Diagnosis present

## 2016-06-23 DIAGNOSIS — K760 Fatty (change of) liver, not elsewhere classified: Secondary | ICD-10-CM | POA: Diagnosis present

## 2016-06-23 DIAGNOSIS — E1065 Type 1 diabetes mellitus with hyperglycemia: Secondary | ICD-10-CM | POA: Diagnosis present

## 2016-06-23 DIAGNOSIS — Z79899 Other long term (current) drug therapy: Secondary | ICD-10-CM

## 2016-06-23 DIAGNOSIS — E876 Hypokalemia: Secondary | ICD-10-CM | POA: Diagnosis present

## 2016-06-23 DIAGNOSIS — R112 Nausea with vomiting, unspecified: Secondary | ICD-10-CM | POA: Diagnosis present

## 2016-06-23 DIAGNOSIS — R000 Tachycardia, unspecified: Secondary | ICD-10-CM | POA: Diagnosis present

## 2016-06-23 DIAGNOSIS — IMO0002 Reserved for concepts with insufficient information to code with codable children: Secondary | ICD-10-CM | POA: Diagnosis present

## 2016-06-23 DIAGNOSIS — I110 Hypertensive heart disease with heart failure: Secondary | ICD-10-CM | POA: Diagnosis present

## 2016-06-23 DIAGNOSIS — E0810 Diabetes mellitus due to underlying condition with ketoacidosis without coma: Secondary | ICD-10-CM | POA: Diagnosis not present

## 2016-06-23 DIAGNOSIS — E1110 Type 2 diabetes mellitus with ketoacidosis without coma: Secondary | ICD-10-CM | POA: Diagnosis present

## 2016-06-23 DIAGNOSIS — M1990 Unspecified osteoarthritis, unspecified site: Secondary | ICD-10-CM | POA: Diagnosis present

## 2016-06-23 LAB — COMPREHENSIVE METABOLIC PANEL
ALT: 11 U/L — ABNORMAL LOW (ref 14–54)
AST: 22 U/L (ref 15–41)
Albumin: 4.5 g/dL (ref 3.5–5.0)
Alkaline Phosphatase: 92 U/L (ref 38–126)
Anion gap: 18 — ABNORMAL HIGH (ref 5–15)
BUN: 20 mg/dL (ref 6–20)
CALCIUM: 9.5 mg/dL (ref 8.9–10.3)
CO2: 15 mmol/L — AB (ref 22–32)
CREATININE: 0.7 mg/dL (ref 0.44–1.00)
Chloride: 101 mmol/L (ref 101–111)
GFR calc non Af Amer: >60 mL/min (ref >60)
GLUCOSE: 441 mg/dL — AB (ref 65–99)
Potassium: 4.6 mmol/L (ref 3.5–5.1)
SODIUM: 134 mmol/L — AB (ref 135–145)
Total Bilirubin: 1.2 mg/dL (ref 0.3–1.2)
Total Protein: 7.8 g/dL (ref 6.5–8.1)

## 2016-06-23 LAB — RAPID URINE DRUG SCREEN, HOSP PERFORMED
AMPHETAMINES: NOT DETECTED
BARBITURATES: NOT DETECTED
BENZODIAZEPINES: NOT DETECTED
Cocaine: NOT DETECTED
Opiates: POSITIVE — AB
Tetrahydrocannabinol: POSITIVE — AB

## 2016-06-23 LAB — BASIC METABOLIC PANEL
ANION GAP: 18 — AB (ref 5–15)
ANION GAP: 9 (ref 5–15)
ANION GAP: 10 (ref 5–15)
ANION GAP: 9 (ref 5–15)
BUN: 18 mg/dL (ref 6–20)
BUN: 15 mg/dL (ref 6–20)
BUN: 13 mg/dL (ref 6–20)
BUN: 11 mg/dL (ref 6–20)
CALCIUM: 8.9 mg/dL (ref 8.9–10.3)
CALCIUM: 8.2 mg/dL — AB (ref 8.9–10.3)
CALCIUM: 8.5 mg/dL — AB (ref 8.9–10.3)
CALCIUM: 8.9 mg/dL (ref 8.9–10.3)
CO2: 13 mmol/L — AB (ref 22–32)
CO2: 15 mmol/L — AB (ref 22–32)
CO2: 15 mmol/L — AB (ref 22–32)
CO2: 18 mmol/L — AB (ref 22–32)
CREATININE: 0.7 mg/dL (ref 0.44–1.00)
Chloride: 108 mmol/L (ref 101–111)
Chloride: 113 mmol/L — ABNORMAL HIGH (ref 101–111)
Chloride: 111 mmol/L (ref 101–111)
Chloride: 110 mmol/L (ref 101–111)
Creatinine, Ser: 0.56 mg/dL (ref 0.44–1.00)
Creatinine, Ser: 0.56 mg/dL (ref 0.44–1.00)
Creatinine, Ser: 0.59 mg/dL (ref 0.44–1.00)
GFR calc Af Amer: >60 mL/min (ref >60)
GFR calc Af Amer: >60 mL/min (ref >60)
GFR calc Af Amer: >60 mL/min (ref >60)
GFR calc Af Amer: >60 mL/min (ref >60)
GFR calc non Af Amer: >60 mL/min (ref >60)
GFR calc non Af Amer: >60 mL/min (ref >60)
GLUCOSE: 266 mg/dL — AB (ref 65–99)
GLUCOSE: 137 mg/dL — AB (ref 65–99)
GLUCOSE: 134 mg/dL — AB (ref 65–99)
GLUCOSE: 173 mg/dL — AB (ref 65–99)
POTASSIUM: 3.9 mmol/L (ref 3.5–5.1)
Potassium: 4.8 mmol/L (ref 3.5–5.1)
Potassium: 4.3 mmol/L (ref 3.5–5.1)
Potassium: 4 mmol/L (ref 3.5–5.1)
Sodium: 139 mmol/L (ref 135–145)
Sodium: 137 mmol/L (ref 135–145)
Sodium: 136 mmol/L (ref 135–145)
Sodium: 137 mmol/L (ref 135–145)

## 2016-06-23 LAB — GLUCOSE, CAPILLARY
GLUCOSE-CAPILLARY: 133 mg/dL — AB (ref 65–99)
GLUCOSE-CAPILLARY: 149 mg/dL — AB (ref 65–99)
GLUCOSE-CAPILLARY: 182 mg/dL — AB (ref 65–99)
GLUCOSE-CAPILLARY: 288 mg/dL — AB (ref 65–99)
Glucose-Capillary: 165 mg/dL — ABNORMAL HIGH (ref 65–99)
Glucose-Capillary: 188 mg/dL — ABNORMAL HIGH (ref 65–99)
Glucose-Capillary: 189 mg/dL — ABNORMAL HIGH (ref 65–99)
Glucose-Capillary: 131 mg/dL — ABNORMAL HIGH (ref 65–99)
Glucose-Capillary: 156 mg/dL — ABNORMAL HIGH (ref 65–99)
Glucose-Capillary: 149 mg/dL — ABNORMAL HIGH (ref 65–99)
Glucose-Capillary: 126 mg/dL — ABNORMAL HIGH (ref 65–99)
Glucose-Capillary: 196 mg/dL — ABNORMAL HIGH (ref 65–99)
Glucose-Capillary: 255 mg/dL — ABNORMAL HIGH (ref 65–99)
Glucose-Capillary: 180 mg/dL — ABNORMAL HIGH (ref 65–99)
Glucose-Capillary: 168 mg/dL — ABNORMAL HIGH (ref 65–99)
Glucose-Capillary: 164 mg/dL — ABNORMAL HIGH (ref 65–99)

## 2016-06-23 LAB — URINALYSIS, ROUTINE W REFLEX MICROSCOPIC
BACTERIA UA: NONE SEEN
Bilirubin Urine: NEGATIVE
Glucose, UA: >=500 mg/dL — AB
HGB URINE DIPSTICK: NEGATIVE
Ketones, ur: 80 mg/dL — AB
NITRITE: NEGATIVE
Protein, ur: NEGATIVE mg/dL
SPECIFIC GRAVITY, URINE: 1.021 (ref 1.005–1.030)
pH: 5 (ref 5.0–8.0)

## 2016-06-23 LAB — MRSA PCR SCREENING: MRSA by PCR: NEGATIVE

## 2016-06-23 LAB — PHOSPHORUS: Phosphorus: 3.3 mg/dL (ref 2.5–4.6)

## 2016-06-23 LAB — MAGNESIUM: Magnesium: 1.7 mg/dL (ref 1.7–2.4)

## 2016-06-23 LAB — CBC
HEMATOCRIT: 37.2 % (ref 36.0–46.0)
Hemoglobin: 12.3 g/dL (ref 12.0–15.0)
MCH: 29.1 pg (ref 26.0–34.0)
MCHC: 33.1 g/dL (ref 30.0–36.0)
MCV: 88.2 fL (ref 78.0–100.0)
Platelets: 384 10*3/uL (ref 150–400)
RBC: 4.22 MIL/uL (ref 3.87–5.11)
RDW: 14.7 % (ref 11.5–15.5)
WBC: 8.1 10*3/uL (ref 4.0–10.5)

## 2016-06-23 LAB — PREGNANCY, URINE: Preg Test, Ur: NEGATIVE

## 2016-06-23 LAB — CBG MONITORING, ED: GLUCOSE-CAPILLARY: 421 mg/dL — AB (ref 65–99)

## 2016-06-23 LAB — LIPASE, BLOOD: Lipase: 14 U/L (ref 11–51)

## 2016-06-23 MED ORDER — SODIUM CHLORIDE 0.9 % IV SOLN
8.0000 mg | Freq: Once | INTRAVENOUS | Status: DC
  Filled 2016-06-23: qty 4

## 2016-06-23 MED ORDER — SODIUM CHLORIDE 0.9 % IV BOLUS (SEPSIS)
1000.0000 mL | Freq: Once | INTRAVENOUS | Status: AC
  Administered 2016-06-23: 1000 mL via INTRAVENOUS

## 2016-06-23 MED ORDER — ONDANSETRON HCL 4 MG/2ML IJ SOLN
4.0000 mg | Freq: Once | INTRAMUSCULAR | Status: AC
  Administered 2016-06-23: 4 mg via INTRAVENOUS
  Filled 2016-06-23: qty 2

## 2016-06-23 MED ORDER — METOCLOPRAMIDE HCL 5 MG/ML IJ SOLN
10.0000 mg | Freq: Four times a day (QID) | INTRAMUSCULAR | Status: DC
  Administered 2016-06-23 – 2016-06-28 (×20): 10 mg via INTRAVENOUS
  Filled 2016-06-23 (×20): qty 2

## 2016-06-23 MED ORDER — DEXTROSE-NACL 5-0.45 % IV SOLN: INTRAVENOUS | Status: DC

## 2016-06-23 MED ORDER — MORPHINE SULFATE (PF) 2 MG/ML IV SOLN
2.0000 mg | Freq: Once | INTRAVENOUS | Status: AC
  Administered 2016-06-23: 2 mg via INTRAVENOUS
  Filled 2016-06-23: qty 1

## 2016-06-23 MED ORDER — DIPHENHYDRAMINE HCL 50 MG/ML IJ SOLN
25.0000 mg | Freq: Once | INTRAMUSCULAR | Status: AC
  Administered 2016-06-23: 25 mg via INTRAVENOUS
  Filled 2016-06-23: qty 1

## 2016-06-23 MED ORDER — PROMETHAZINE HCL 25 MG/ML IJ SOLN
25.0000 mg | Freq: Four times a day (QID) | INTRAMUSCULAR | Status: DC | PRN
  Administered 2016-06-23 – 2016-06-26 (×6): 25 mg via INTRAVENOUS
  Filled 2016-06-23 (×7): qty 1

## 2016-06-23 MED ORDER — METOPROLOL TARTRATE 12.5 MG HALF TABLET
12.5000 mg | ORAL_TABLET | Freq: Two times a day (BID) | ORAL | Status: DC
  Filled 2016-06-23: qty 1

## 2016-06-23 MED ORDER — PROMETHAZINE HCL 25 MG/ML IJ SOLN
25.0000 mg | Freq: Once | INTRAMUSCULAR | Status: AC
  Administered 2016-06-23: 25 mg via INTRAVENOUS
  Filled 2016-06-23: qty 1

## 2016-06-23 MED ORDER — METOPROLOL TARTRATE 5 MG/5ML IV SOLN
5.0000 mg | Freq: Four times a day (QID) | INTRAVENOUS | Status: DC | PRN
  Administered 2016-06-23 – 2016-06-27 (×4): 5 mg via INTRAVENOUS
  Filled 2016-06-23 (×4): qty 5

## 2016-06-23 MED ORDER — ORAL CARE MOUTH RINSE
15.0000 mL | Freq: Two times a day (BID) | OROMUCOSAL | Status: DC
  Administered 2016-06-27: 15 mL via OROMUCOSAL

## 2016-06-23 MED ORDER — ENOXAPARIN SODIUM 40 MG/0.4ML ~~LOC~~ SOLN
40.0000 mg | SUBCUTANEOUS | Status: DC
  Administered 2016-06-23 – 2016-06-27 (×5): 40 mg via SUBCUTANEOUS
  Filled 2016-06-23 (×5): qty 0.4

## 2016-06-23 MED ORDER — SODIUM CHLORIDE 0.9 % IV SOLN
INTRAVENOUS | Status: AC
  Administered 2016-06-23: 10:00:00 via INTRAVENOUS

## 2016-06-23 MED ORDER — DEXTROSE-NACL 5-0.45 % IV SOLN
INTRAVENOUS | Status: DC
  Administered 2016-06-23 – 2016-06-24 (×4): via INTRAVENOUS

## 2016-06-23 MED ORDER — KETOROLAC TROMETHAMINE 15 MG/ML IJ SOLN
15.0000 mg | Freq: Four times a day (QID) | INTRAMUSCULAR | Status: AC | PRN
  Administered 2016-06-23 – 2016-06-26 (×9): 15 mg via INTRAVENOUS
  Filled 2016-06-23 (×9): qty 1

## 2016-06-23 MED ORDER — MAGNESIUM SULFATE 2 GM/50ML IV SOLN
2.0000 g | Freq: Once | INTRAVENOUS | Status: AC
  Administered 2016-06-23: 2 g via INTRAVENOUS
  Filled 2016-06-23: qty 50

## 2016-06-23 MED ORDER — KETOROLAC TROMETHAMINE 15 MG/ML IJ SOLN
15.0000 mg | Freq: Once | INTRAMUSCULAR | Status: AC
  Administered 2016-06-23: 15 mg via INTRAVENOUS
  Filled 2016-06-23: qty 1

## 2016-06-23 MED ORDER — POTASSIUM CHLORIDE 10 MEQ/100ML IV SOLN
10.0000 meq | INTRAVENOUS | Status: AC
  Administered 2016-06-23 (×2): 10 meq via INTRAVENOUS
  Filled 2016-06-23 (×2): qty 100

## 2016-06-23 MED ORDER — MORPHINE SULFATE (PF) 2 MG/ML IV SOLN
4.0000 mg | Freq: Once | INTRAVENOUS | Status: AC
  Administered 2016-06-23: 4 mg via INTRAVENOUS
  Filled 2016-06-23: qty 2

## 2016-06-23 MED ORDER — PANTOPRAZOLE SODIUM 40 MG IV SOLR
40.0000 mg | Freq: Two times a day (BID) | INTRAVENOUS | Status: DC
  Administered 2016-06-23 – 2016-06-28 (×11): 40 mg via INTRAVENOUS
  Filled 2016-06-23 (×12): qty 40

## 2016-06-23 MED ORDER — INSULIN REGULAR HUMAN 100 UNIT/ML IJ SOLN: INTRAMUSCULAR | Status: DC

## 2016-06-23 MED ORDER — PANTOPRAZOLE SODIUM 40 MG PO TBEC
40.0000 mg | DELAYED_RELEASE_TABLET | Freq: Two times a day (BID) | ORAL | Status: DC
  Filled 2016-06-23: qty 1

## 2016-06-23 MED ORDER — PROMETHAZINE HCL 25 MG PO TABS: 25.0000 mg | ORAL_TABLET | Freq: Four times a day (QID) | ORAL | Status: DC | PRN

## 2016-06-23 MED ORDER — MOMETASONE FURO-FORMOTEROL FUM 200-5 MCG/ACT IN AERO
2.0000 | INHALATION_SPRAY | Freq: Two times a day (BID) | RESPIRATORY_TRACT | Status: DC
  Filled 2016-06-23: qty 8.8

## 2016-06-23 MED ORDER — CHLORHEXIDINE GLUCONATE 0.12 % MT SOLN
15.0000 mL | Freq: Two times a day (BID) | OROMUCOSAL | Status: DC
  Administered 2016-06-23 – 2016-06-27 (×8): 15 mL via OROMUCOSAL
  Filled 2016-06-23 (×7): qty 15

## 2016-06-23 MED ORDER — ALBUTEROL SULFATE (2.5 MG/3ML) 0.083% IN NEBU: 2.5000 mg | INHALATION_SOLUTION | Freq: Four times a day (QID) | RESPIRATORY_TRACT | Status: DC | PRN

## 2016-06-23 MED ORDER — METOCLOPRAMIDE HCL 5 MG/ML IJ SOLN
10.0000 mg | Freq: Once | INTRAMUSCULAR | Status: AC
  Administered 2016-06-23: 10 mg via INTRAVENOUS
  Filled 2016-06-23: qty 2

## 2016-06-23 MED ORDER — SODIUM CHLORIDE 0.9 % IV SOLN: INTRAVENOUS | Status: DC

## 2016-06-23 MED ORDER — INSULIN REGULAR HUMAN 100 UNIT/ML IJ SOLN
INTRAMUSCULAR | Status: DC
  Administered 2016-06-23: 3.6 [IU]/h via INTRAVENOUS
  Filled 2016-06-23: qty 1

## 2016-06-23 MED ORDER — ACETAMINOPHEN 325 MG PO TABS
650.0000 mg | ORAL_TABLET | Freq: Three times a day (TID) | ORAL | Status: AC
  Filled 2016-06-23 (×2): qty 2

## 2016-06-23 MED ORDER — METOPROLOL TARTRATE 5 MG/5ML IV SOLN
5.0000 mg | Freq: Four times a day (QID) | INTRAVENOUS | Status: DC
  Administered 2016-06-23 – 2016-06-28 (×17): 5 mg via INTRAVENOUS
  Filled 2016-06-23 (×18): qty 5

## 2016-06-23 NOTE — ED Notes (Signed)
Pt assisted to restroom and pt attempting to obtain urine sample. Pt reports no change in pain or nausea after medications.

## 2016-06-23 NOTE — ED Notes (Signed)
Called floor and spoke with charge nurse, advised they are not ready for pt

## 2016-06-23 NOTE — ED Provider Notes (Signed)
[facility] DEPT Provider Note   CSN: [id number] Arrival date & time: 06/23/16  0217  By signing my name below, I, [name], attest that this documentation has been prepared under the direction and in the presence of Nadia Viar, Barbette Hair, MD. Electronically Signed: Lise Auer, ED Scribe. 06/23/16. 4:09 AM.  History   Chief Complaint Chief Complaint  Patient presents with  . Abdominal Pain  . Emesis   The history is provided by the patient. No language interpreter was used.    HPI Comments: [name] is a 26 y.o. female with a PMHx of DM I and gastroparesis who presents to the Emergency Department complaining of gradually worsening, persistent vomiting that began tonight, she rates the severity of her pain a 10. Pt notes associated total body aching. She reports she has been vomiting for a few hours.Tried Phenergan PTA with no relief. No recent illnesses or sick contact. No alleviating or aggravating factors noted at this time. Denies any other acute associated symptoms.   History of gastroparesis and DKA for currently.  Past Medical History:  Diagnosis Date  . Anxiety   . Arthritis   . Asthma   . Diabetes mellitus 2007   IDDM.  poorly controlled, multiple admits with DKA  . Gallstones   . Gastroparesis   . Heart murmur   . Hepatic steatosis 11/26/2014   and hepatomegaly  . Hypertension    NOT CURRENTLY ON ANY BP MED  . Liver mass 11/26/2014  . Pancreatitis, acute 11/26/2014    Patient Active Problem List   Diagnosis Date Noted  . Hyponatremia 06/13/2016  . Hematuria 05/13/2016  . UTI (urinary tract infection) 01/22/2016  . Diarrhea 11/09/2015  . Acute urinary retention   . GERD (gastroesophageal reflux disease) 07/10/2015  . Depression with anxiety 07/10/2015  . Gastroparesis 06/22/2015  . Altered mental status 06/22/2015  . Volume depletion 06/10/2015  . Protein-calorie malnutrition, severe 06/10/2015  . Hyperglycemia   . Elevated bilirubin   .  Hematemesis with nausea   . Intractable nausea and vomiting 05/28/2015  . Abdominal pain in female   . Hypertension 05/24/2015  . Dehydration   . Chronic diastolic heart failure ([place]) 04/11/2015  . Hematemesis 04/08/2015  . DKA (diabetic ketoacidoses) ([place]) 03/22/2015  . S/P laparoscopic cholecystectomy 02/11/2015  . Postextubation stridor   . Pancreatitis, acute 11/26/2014  . Volume overload 11/26/2014  . Hypokalemia 11/26/2014  . Hepatic steatosis 11/26/2014  . Liver mass 11/26/2014  . Sepsis ([place]) 11/25/2014  . Sinus tachycardia 11/25/2014  . Hypomagnesemia 11/25/2014  . Hypophosphatemia 11/25/2014  . Elevated LFTs 11/24/2014  . Acute kidney injury ([place]) 11/24/2014  . Migraine headache 11/24/2014  . Asthma 06/29/2012  . Uncontrolled type 1 diabetes mellitus ([place]) 06/19/2010  . Goiter, unspecified 06/19/2010    Past Surgical History:  Procedure Laterality Date  . CHOLECYSTECTOMY N/A 02/11/2015   Procedure: LAPAROSCOPIC CHOLECYSTECTOMY WITH INTRAOPERATIVE CHOLANGIOGRAM;  Surgeon: Greer Pickerel, MD;  Location: [facility] ORS;  Service: General;  Laterality: N/A;  . ESOPHAGOGASTRODUODENOSCOPY (EGD) WITH PROPOFOL Left 09/20/2014   Procedure: ESOPHAGOGASTRODUODENOSCOPY (EGD) WITH PROPOFOL;  Surgeon: Arta Silence, MD;  Location: [facility] ENDOSCOPY;  Service: Endoscopy;  Laterality: Left;  . WISDOM TOOTH EXTRACTION      OB History    Gravida Para Term Preterm AB Living   2 1 0 1 1 1    SAB TAB Ectopic Multiple Live Births   0 1 0 0 1       Home Medications    Prior to Admission  medications   Medication Sig Start Date End Date Taking? Authorizing Provider  albuterol (PROVENTIL HFA;VENTOLIN HFA) 108 (90 BASE) MCG/ACT inhaler Inhale 2 puffs into the lungs every 6 (six) hours as needed for wheezing or shortness of breath.    Yes [provider]  albuterol (PROVENTIL) (2.5 MG/3ML) 0.083% nebulizer solution Take 2.5 mg by nebulization every 6 (six) hours as needed for wheezing or  shortness of breath.    Yes [provider]  budesonide-formoterol (SYMBICORT) 160-4.5 MCG/ACT inhaler Inhale 2 puffs into the lungs 2 (two) times daily.    Yes [provider]  glucagon (GLUCAGON EMERGENCY) 1 MG injection Inject 1 mg into the vein once as needed. 06/14/16  Yes Sheikh, Omair Latif, DO  insulin aspart (NOVOLOG FLEXPEN) 100 UNIT/ML FlexPen Inject into the skin See admin instructions. Pt uses 1 unit for every 10 grams of carbs with meals and snacks for BS greater than 50.   Yes [provider]  insulin glargine (LANTUS) 100 UNIT/ML injection Inject 0.13 mLs (13 Units total) into the skin daily. 06/15/16  Yes Sheikh, Omair Latif, DO  metoprolol tartrate (LOPRESSOR) 25 MG tablet Take 0.5 tablets (12.5 mg total) by mouth 2 (two) times daily. 05/10/16  Yes Mariel Aloe, MD  pantoprazole (PROTONIX) 40 MG tablet Take 1 tablet (40 mg total) by mouth 2 (two) times daily. 02/25/16  Yes Regalado, Belkys A, MD  promethazine (PHENERGAN) 25 MG tablet Take 1 tablet (25 mg total) by mouth every 6 (six) hours as needed for nausea or vomiting. 06/15/16  Yes Deno Etienne, DO  Vitamin D, Ergocalciferol, (DRISDOL) 50000 units CAPS capsule Take 50,000 Units by mouth once a week. 05/26/16  Yes [provider]  famotidine (PEPCID) 20 MG tablet Take 1 tablet (20 mg total) by mouth at bedtime. Patient not taking: Reported on 06/23/2016 02/29/16   Antonietta Breach, PA-C  ketorolac (TORADOL) 10 MG tablet Take 1 tablet (10 mg total) by mouth every 6 (six) hours as needed. Patient not taking: Reported on 06/23/2016 06/14/16   Raiford Noble Latif, DO  metoCLOPramide (REGLAN) 10 MG tablet Take 1 tablet (10 mg total) by mouth 3 (three) times daily before meals. Patient not taking: Reported on 06/23/2016 02/29/16   Antonietta Breach, PA-C    Family History Family History  Problem Relation Age of Onset  . Heart disease Maternal Grandmother   . Heart disease Maternal Grandfather   . Diabetes Mother   .  Hyperlipidemia Mother   . Hypertension Father   . Heart disease Father   . Hypertension Paternal Grandmother   . Cancer Paternal Grandfather     Social History Social History  Substance Use Topics  . Smoking status: Never Smoker  . Smokeless tobacco: Never Used  . Alcohol use No     Allergies   Peanut-containing drug products; Food; and Ultram [tramadol]   Review of Systems Review of Systems  Constitutional: Negative for fever.  Respiratory: Negative for shortness of breath.   Cardiovascular: Negative for chest pain.  Gastrointestinal: Positive for abdominal pain, diarrhea, nausea and vomiting.  Musculoskeletal: Positive for myalgias.  All other systems reviewed and are negative.   Physical Exam Updated Vital Signs BP (!) 142/85   Pulse (!) 124   Resp 19   Ht 5\' 4"  (1.626 m)   Wt 52.2 kg (115 lb)   LMP 06/08/2016   SpO2 100%   BMI 19.74 kg/m   Physical Exam  Constitutional: She is oriented to person, place, and time.  Ill appearing, actively retching  HENT:  Head: Normocephalic and atraumatic.  Cardiovascular: Regular rhythm and normal heart sounds.   Tachycardia  Pulmonary/Chest: Effort normal. No respiratory distress.  Abdominal: Soft. She exhibits no distension. There is no tenderness.  Neurological: She is alert and oriented to person, place, and time.  Skin: Skin is warm and dry.  Psychiatric: She has a normal mood and affect.  Nursing note and vitals reviewed.   ED Treatments / Results  DIAGNOSTIC STUDIES: Oxygen Saturation is 96% on RA, adqueate by my interpretation.   COORDINATION OF CARE: 3:55 AM-Discussed next steps with pt. Pt verbalized understanding and is agreeable with the plan.   Labs (all labs ordered are listed, but only abnormal results are displayed) Labs Reviewed  URINALYSIS, ROUTINE W REFLEX MICROSCOPIC - Abnormal; Notable for the following:       Result Value   APPearance HAZY (*)    Glucose, UA >=500 (*)    Ketones, ur  80 (*)    Leukocytes, UA MODERATE (*)    Squamous Epithelial / LPF 0-5 (*)    All other components within normal limits  COMPREHENSIVE METABOLIC PANEL - Abnormal; Notable for the following:    Sodium 134 (*)    CO2 15 (*)    Glucose, Bld 441 (*)    ALT 11 (*)    Anion gap 18 (*)    All other components within normal limits  CBC  LIPASE, BLOOD    EKG  EKG Interpretation None       Radiology No results found.  Procedures Procedures (including critical care time)  CRITICAL CARE Performed by: Merryl Hacker   Total critical care time: 35 minutes  Critical care time was exclusive of separately billable procedures and treating other patients.  Critical care was necessary to treat or prevent imminent or life-threatening deterioration.  Critical care was time spent personally by me on the following activities: development of treatment plan with patient and/or surrogate as well as nursing, discussions with consultants, evaluation of patient's response to treatment, examination of patient, obtaining history from patient or surrogate, ordering and performing treatments and interventions, ordering and review of laboratory studies, ordering and review of radiographic studies, pulse oximetry and re-evaluation of patient's condition.   Medications Ordered in ED Medications  sodium chloride 0.9 % bolus 1,000 mL (not administered)  dextrose 5 %-0.45 % sodium chloride infusion (not administered)  insulin regular (NOVOLIN R,HUMULIN R) 100 Units in sodium chloride 0.9 % 100 mL (1 Units/mL) infusion (not administered)  sodium chloride 0.9 % bolus 1,000 mL (not administered)  promethazine (PHENERGAN) injection 25 mg (not administered)  sodium chloride 0.9 % bolus 1,000 mL (0 mLs Intravenous Stopped 06/23/16 0713)  metoCLOPramide (REGLAN) injection 10 mg (10 mg Intravenous Given 06/23/16 0447)  diphenhydrAMINE (BENADRYL) injection 25 mg (25 mg Intravenous Given 06/23/16 0447)  morphine 2  MG/ML injection 4 mg (4 mg Intravenous Given 06/23/16 0445)     Initial Impression / Assessment and Plan / ED Course  I have reviewed the triage vital signs and the nursing notes.  Pertinent labs & imaging results that were available during my care of the patient were reviewed by me and considered in my medical decision making (see chart for details).     Patient presents with vomiting and diffuse pain. She is retching and chronically ill-appearing on exam. Tachycardic. Otherwise vital signs reassuring. Patient was given fluids, Reglan, Benadryl, and morphine. Labwork was delayed. However, upon obtaining lab work it appears  that she is likely in early DKA with a glucose of 441 and an anion gap of 18. 2 additional liters of fluid were ordered and glucose stabilizer was started. On recheck, she reports continued nausea and retching. She was additionally ordered Phenergan. I do not feel that narcotic pain medication at this time are beneficial given the extent of her gastroparesis. Will discuss with the admitting hospitalist.  Final Clinical Impressions(s) / ED Diagnoses   Final diagnoses:  Diabetic ketoacidosis without coma associated with type 1 diabetes mellitus (HCC)  Nausea and vomiting, intractability of vomiting not specified, unspecified vomiting type  Gastroparesis    New Prescriptions New Prescriptions   No medications on file   I personally performed the services described in this documentation, which was scribed in my presence. The recorded information has been reviewed and is accurate.     Merryl Hacker, MD 06/23/16 [phone]

## 2016-06-23 NOTE — Care Management Note (Signed)
Case Management Note  Patient Details  Name: Madisun Hargrove MRN: [id number] Date of Birth: 1990-04-10  Subjective/Objective:                  Generalized abdominal pain, myalgias Diabetic ketoacidosis without coma associated with type 1 diabetes mellitus  Action/Plan: Date:  June 23, 2016 Chart reviewed for concurrent status and case management needs. Will continue to follow patient progress. Discharge Planning: following for needs Expected discharge date: [id number] Velva Harman, BSN, [place], [place]  Expected Discharge Date:                  Expected Discharge Plan:  Home/Self Care  In-House Referral:     Discharge planning Services  CM Consult  Post Acute Care Choice:    Choice offered to:     DME Arranged:    DME Agency:     HH Arranged:    HH Agency:     Status of Service:  In process, will continue to follow  If discussed at Long Length of Stay Meetings, dates discussed:    Additional Comments:  Leeroy Cha, RN 06/23/2016, 8:56 AM

## 2016-06-23 NOTE — ED Triage Notes (Signed)
Pt has gastroparesis and diabetes  Mother states she started vomiting yesterday but was feeling better yesterday evening and woke this morning vomiting again

## 2016-06-23 NOTE — H&P (Signed)
History and Physical    Omega Durante WLN:[id number] DOB: 1990-04-25 DOA: 06/23/2016  Referring MD/NP/PA: Thayer Jew  PCP: Vicenta Aly, [place]   Patient coming from: home  Chief Complaint: nausea, vomiting, abdominal pain  HPI: [name] is a 26 y.o. female with history of DM1, hypertension, asthma, diabetic gastroparesis and 9 admissions over the last 6 months who presents again with nausea, vomiting, and abdominal pain.  She states that she was having intermittent abdominal pain and nausea yesterday but they got better yesterday evening. She was also having some loose stools yesterday which are common for her ever since she had her gallbladder removed but she was not having any fevers or chills. Around 1 AM she woke up with sharp 10 out of 10 all over abdominal pain that radiated to her whole body. This was associated with nausea and vomiting. She has vomited approximately 5 times, nonbilious sometimes brown with streaks of blood. Most the time she is just heaving. She has not had any loose stools since yesterday. She has had some hot flashes and cold chills since this morning. She denies dysuria, urgency, and frequency. She denies cough and shortness of breath and chest pains.  ED Course: Vital signs tachycardic to the 130s, sinus rhythm, blood pressures as high as 174/100 breathing comfortably on room air.  Labs:  CBG 421, CO2 15, anion gap 18.  LFTs and lipase were normal. Urinalysis positive for ketones, glucose. She always has to numerous to count WBCs. No bacteria were seen, but she again has budding yeast.  She was given 3 L of IV fluids, Phenergan, Benadryl, morphine 4 mg, Reglan 10 mg, Toradol 15 mg, and started on an insulin drip in the emergency department.  She repeatedly asked for Dilaudid or morphine.  Review of Systems:  General:  Subjective fevers and chills, denies weight change HEENT:  Denies changes to hearing and vision, sinus congestion, sore throat CV:   Denies chest pain, palpitations, lower extremity edema.  PULM:  Denies SOB, cough.   GI:  Per history of present illness   GU:  Denies dysuria, frequency, urgency ENDO:  Denies polyuria, polydipsia.   HEME:  Denies blood in stools, abnormal bruising or bleeding.  LYMPH:  Denies lymphadenopathy.   MSK:  Denies arthralgias, myalgias.   DERM:  Denies skin rash or ulcer.   NEURO:  Denies new focal numbness or weakness, slurred speech, confusion PSYCH:  Denies anxiety and depression.    Past Medical History:  Diagnosis Date  . Anxiety   . Arthritis   . Asthma   . Diabetes mellitus 2007   IDDM.  poorly controlled, multiple admits with DKA  . Gallstones   . Gastroparesis   . Heart murmur   . Hepatic steatosis 11/26/2014   and hepatomegaly  . Hypertension    NOT CURRENTLY ON ANY BP MED  . Liver mass 11/26/2014  . Pancreatitis, acute 11/26/2014    Past Surgical History:  Procedure Laterality Date  . CHOLECYSTECTOMY N/A 02/11/2015   Procedure: LAPAROSCOPIC CHOLECYSTECTOMY WITH INTRAOPERATIVE CHOLANGIOGRAM;  Surgeon: Greer Pickerel, MD;  Location: [facility];  Service: General;  Laterality: N/A;  . ESOPHAGOGASTRODUODENOSCOPY (EGD) WITH PROPOFOL Left 09/20/2014   Procedure: ESOPHAGOGASTRODUODENOSCOPY (EGD) WITH PROPOFOL;  Surgeon: Arta Silence, MD;  Location[facility] ENDOSCOPY;  Service: Endoscopy;  Laterality: Left;  . WISDOM TOOTH EXTRACTION       reports that she has never smoked. She has never used smokeless tobacco. She reports that she uses drugs, including Marijuana. She reports  that she does not drink alcohol.  Allergies  Allergen Reactions  . Peanut-Containing Drug Products Swelling and Other (See Comments)    Reaction:  Swelling of mouth and lips   . 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   Reaction:  Swelling of mouth and lips   . Ultram [Tramadol] Itching    Family History  Problem Relation Age of Onset  . Heart disease Maternal Grandmother   .  Heart disease Maternal Grandfather   . Diabetes Mother   . Hyperlipidemia Mother   . Hypertension Father   . Heart disease Father   . Hypertension Paternal Grandmother   . Cancer Paternal Grandfather     Prior to Admission medications   Medication Sig Start Date End Date Taking? Authorizing Provider  albuterol (PROVENTIL HFA;VENTOLIN HFA) 108 (90 BASE) MCG/ACT inhaler Inhale 2 puffs into the lungs every 6 (six) hours as needed for wheezing or shortness of breath.    Yes [provider]  albuterol (PROVENTIL) (2.5 MG/3ML) 0.083% nebulizer solution Take 2.5 mg by nebulization every 6 (six) hours as needed for wheezing or shortness of breath.    Yes [provider]  budesonide-formoterol (SYMBICORT) 160-4.5 MCG/ACT inhaler Inhale 2 puffs into the lungs 2 (two) times daily.    Yes [provider]  glucagon (GLUCAGON EMERGENCY) 1 MG injection Inject 1 mg into the vein once as needed. 06/14/16  Yes Sheikh, Omair Latif, DO  insulin aspart (NOVOLOG FLEXPEN) 100 UNIT/ML FlexPen Inject into the skin See admin instructions. Pt uses 1 unit for every 10 grams of carbs with meals and snacks for BS greater than 50.   Yes [provider]  insulin glargine (LANTUS) 100 UNIT/ML injection Inject 0.13 mLs (13 Units total) into the skin daily. 06/15/16  Yes Sheikh, Omair Latif, DO  metoprolol tartrate (LOPRESSOR) 25 MG tablet Take 0.5 tablets (12.5 mg total) by mouth 2 (two) times daily. 05/10/16  Yes Mariel Aloe, MD  pantoprazole (PROTONIX) 40 MG tablet Take 1 tablet (40 mg total) by mouth 2 (two) times daily. 02/25/16  Yes Regalado, Belkys A, MD  promethazine (PHENERGAN) 25 MG tablet Take 1 tablet (25 mg total) by mouth every 6 (six) hours as needed for nausea or vomiting. 06/15/16  Yes Deno Etienne, DO  Vitamin D, Ergocalciferol, (DRISDOL) 50000 units CAPS capsule Take 50,000 Units by mouth once a week. 05/26/16  Yes [provider]  famotidine (PEPCID) 20 MG tablet Take 1  tablet (20 mg total) by mouth at bedtime. Patient not taking: Reported on 06/23/2016 02/29/16   Antonietta Breach, PA-C  ketorolac (TORADOL) 10 MG tablet Take 1 tablet (10 mg total) by mouth every 6 (six) hours as needed. Patient not taking: Reported on 06/23/2016 06/14/16   Raiford Noble Latif, DO  metoCLOPramide (REGLAN) 10 MG tablet Take 1 tablet (10 mg total) by mouth 3 (three) times daily before meals. Patient not taking: Reported on 06/23/2016 02/29/16   Antonietta Breach, PA-C    Physical Exam: Vitals:   06/23/16 0639 06/23/16 0700 06/23/16 0800 06/23/16 0842  BP: (!) 148/94 (!) 142/85 (!) 161/102 (!) 174/100  Pulse: (!) 125 (!) 124 (!) 130 (!) 134  Resp: 18 19 16 17   SpO2: 100% 100% 100% 100%  Weight:      Height:          Constitutional: Thin, NAD, calm, comfortable Eyes: PERRL, lids and conjunctivae normal ENMT:  Mucous membranes somewhat dry.  Oropharynx nonerythematous,  no exudates.   Neck:  No nuchal rigidity, no masses Respiratory:  No wheezes, rales, or rhonchi Cardiovascular: Tachycardic, regular rhythm, no murmurs / rubs / gallops.  2+ radial pulses. Abdomen:  Normal active bowel sounds, soft, nondistended, nontender Musculoskeletal: Normal muscle tone and bulk.  No contractures.  Skin:  no rashes, abrasions, or ulcers Neurologic:  CN 2-12 grossly intact. Sensation intact to light touch, strength 5/5 throughout Psychiatric:  Alert and oriented x 3. Normal affect.   Labs on Admission: I have personally reviewed following labs and imaging studies  CBC:  Recent Labs Lab 06/16/16 1758 06/23/16 0438  WBC 6.3 8.1  HGB 12.2 12.3  HCT 35.9* 37.2  MCV 86.3 88.2  PLT 457* 233   Basic Metabolic Panel:  Recent Labs Lab 06/16/16 1758 06/23/16 0534  NA 139 134*  K 3.6 4.6  CL 101 101  CO2 27 15*  GLUCOSE 190* 441*  BUN <5* 20  CREATININE 0.53 0.70  CALCIUM 9.1 9.5   GFR: Estimated Creatinine Clearance: 87.8 mL/min (by C-G formula based on SCr of 0.7 mg/dL). Liver  Function Tests:  Recent Labs Lab 06/16/16 1758 06/23/16 0534  AST 28 22  ALT 13* 11*  ALKPHOS 85 92  BILITOT 0.4 1.2  PROT 7.0 7.8  ALBUMIN 3.9 4.5    Recent Labs Lab 06/16/16 1758 06/23/16 0534  LIPASE 13 14   No results for input(s): AMMONIA in the last 168 hours. Coagulation Profile: No results for input(s): INR, PROTIME in the last 168 hours. Cardiac Enzymes: No results for input(s): CKTOTAL, CKMB, CKMBINDEX, TROPONINI in the last 168 hours. BNP (last 3 results) No results for input(s): PROBNP in the last 8760 hours. HbA1C: No results for input(s): HGBA1C in the last 72 hours. CBG:  Recent Labs Lab 06/23/16 0718 06/23/16 0840  GLUCAP 421* 288*   Lipid Profile: No results for input(s): CHOL, HDL, LDLCALC, TRIG, CHOLHDL, LDLDIRECT in the last 72 hours. Thyroid Function Tests: No results for input(s): TSH, T4TOTAL, FREET4, T3FREE, THYROIDAB in the last 72 hours. Anemia Panel: No results for input(s): VITAMINB12, FOLATE, FERRITIN, TIBC, IRON, RETICCTPCT in the last 72 hours. Urine analysis:    Component Value Date/Time   COLORURINE YELLOW 06/23/2016 0636   APPEARANCEUR HAZY (A) 06/23/2016 0636   LABSPEC 1.021 06/23/2016 0636   PHURINE 5.0 06/23/2016 0636   GLUCOSEU >=500 (A) 06/23/2016 0636   GLUCOSEU >=1000 11/07/2012 1205   HGBUR NEGATIVE 06/23/2016 0636   BILIRUBINUR NEGATIVE 06/23/2016 0636   KETONESUR 80 (A) 06/23/2016 0636   PROTEINUR NEGATIVE 06/23/2016 0636   UROBILINOGEN 0.2 11/24/2014 1045   NITRITE NEGATIVE 06/23/2016 0636   LEUKOCYTESUR MODERATE (A) 06/23/2016 0636   Sepsis Labs: @LABRCNTIP (procalcitonin:4,lacticidven:4) ) Recent Results (from the past 240 hour(s))  Urine culture     Status: Abnormal   Collection Time: 06/16/16  6:50 PM  Result Value Ref Range Status   Specimen Description URINE, RANDOM  Final   Special Requests NONE  Final   Culture MULTIPLE SPECIES PRESENT, SUGGEST RECOLLECTION (A)  Final   Report Status 06/18/2016  FINAL  Final     Radiological Exams on Admission: No results found.  Assessment/Plan Principal Problem:   DKA (diabetic ketoacidoses) (HCC) Active Problems:   Uncontrolled type 1 diabetes mellitus (HCC)   Sinus tachycardia   Hypertension   Intractable nausea and vomiting   Gastroparesis   GERD (gastroesophageal reflux disease)   Candiduria, asymptomatic   DKA in type I diabetic, uncontrolled, A1c 8.3 on 5/18 which is  the best it has ever been -  Normal saline IV fluids until CBG is less than 250 -  Transition to dextrose containing fluids would CBG is less than 250 -  Insulin drip -  Every 4 hour BMP -  Check magnesium and phos -  Transition to subcutaneous insulin when Is closed and bicarbonate is close to 20  Nausea, vomiting, and abdominal pain likely related to DKA and diabetic gastroparesis.  She is status post cholecystectomy. Her LFTs and lipase were normal. Her urinalysis is always positive and she appears to be colonized with yeast. She has been asymptomatic.   -  Schedule Reglan -  Phenergan when necessary -  Toradol as needed -  K pad -  tylenol -  Minimize use of opiates as these may worsen her gastroparesis -  Continue PPI  Sinus tachycardia due to dehydration and DKA -  Additional bolus of IVF -  Correct DKA  Asymptomatic candiduria -  No treatment unless symptoms develop  Asthma, stable, continue prn albuterol  Hypertension, BP likely increased in the setting of stress -  Resume metoprolol  DVT prophylaxis: lovenox  Code Status: full Family Communication:  Patient alone Disposition Plan: home in 2-3 days once acidosis resolves and tolerating diet  Consults called: none  Admission status: inpatient, stepdown due to severity of tachycardia, dehydration, in the setting of DKA.  Anticipate difficulty advancing diet due to gastroparesis.     Janece Canterbury MD [facility] Pager [phone]  If 7PM-7AM, please contact  night-coverage [email or web address] Password [email or web address]  06/23/2016, 9:22 AM

## 2016-06-23 NOTE — ED Notes (Signed)
Advised by ICU charge nurse to start 20 min timer

## 2016-06-24 ENCOUNTER — Inpatient Hospital Stay (HOSPITAL_COMMUNITY): Payer: Medicaid Other

## 2016-06-24 DIAGNOSIS — I10 Essential (primary) hypertension: Secondary | ICD-10-CM

## 2016-06-24 DIAGNOSIS — K3184 Gastroparesis: Secondary | ICD-10-CM

## 2016-06-24 DIAGNOSIS — E0810 Diabetes mellitus due to underlying condition with ketoacidosis without coma: Secondary | ICD-10-CM

## 2016-06-24 DIAGNOSIS — R112 Nausea with vomiting, unspecified: Secondary | ICD-10-CM

## 2016-06-24 LAB — URINE CULTURE

## 2016-06-24 LAB — BASIC METABOLIC PANEL
Anion gap: 7 (ref 5–15)
Anion gap: 6 (ref 5–15)
BUN: 10 mg/dL (ref 6–20)
BUN: 10 mg/dL (ref 6–20)
CHLORIDE: 109 mmol/L (ref 101–111)
CHLORIDE: 110 mmol/L (ref 101–111)
CO2: 18 mmol/L — AB (ref 22–32)
CO2: 20 mmol/L — AB (ref 22–32)
CREATININE: 0.44 mg/dL (ref 0.44–1.00)
CREATININE: 0.51 mg/dL (ref 0.44–1.00)
Calcium: 8.6 mg/dL — ABNORMAL LOW (ref 8.9–10.3)
Calcium: 8.8 mg/dL — ABNORMAL LOW (ref 8.9–10.3)
GFR calc Af Amer: >60 mL/min (ref >60)
GFR calc Af Amer: >60 mL/min (ref >60)
GFR calc non Af Amer: >60 mL/min (ref >60)
GFR calc non Af Amer: >60 mL/min (ref >60)
GLUCOSE: 141 mg/dL — AB (ref 65–99)
GLUCOSE: 162 mg/dL — AB (ref 65–99)
POTASSIUM: 3.3 mmol/L — AB (ref 3.5–5.1)
POTASSIUM: 3.7 mmol/L (ref 3.5–5.1)
SODIUM: 134 mmol/L — AB (ref 135–145)
SODIUM: 136 mmol/L (ref 135–145)

## 2016-06-24 LAB — CBC
HCT: 34.9 % — ABNORMAL LOW (ref 36.0–46.0)
Hemoglobin: 11.5 g/dL — ABNORMAL LOW (ref 12.0–15.0)
MCH: 29 pg (ref 26.0–34.0)
MCHC: 33 g/dL (ref 30.0–36.0)
MCV: 88.1 fL (ref 78.0–100.0)
PLATELETS: 394 10*3/uL (ref 150–400)
RBC: 3.96 MIL/uL (ref 3.87–5.11)
RDW: 14.2 % (ref 11.5–15.5)
WBC: 6.8 10*3/uL (ref 4.0–10.5)

## 2016-06-24 LAB — GLUCOSE, CAPILLARY
GLUCOSE-CAPILLARY: 153 mg/dL — AB (ref 65–99)
GLUCOSE-CAPILLARY: 143 mg/dL — AB (ref 65–99)
Glucose-Capillary: 149 mg/dL — ABNORMAL HIGH (ref 65–99)
Glucose-Capillary: 114 mg/dL — ABNORMAL HIGH (ref 65–99)
Glucose-Capillary: 154 mg/dL — ABNORMAL HIGH (ref 65–99)
Glucose-Capillary: 150 mg/dL — ABNORMAL HIGH (ref 65–99)
Glucose-Capillary: 145 mg/dL — ABNORMAL HIGH (ref 65–99)
Glucose-Capillary: 149 mg/dL — ABNORMAL HIGH (ref 65–99)
Glucose-Capillary: 157 mg/dL — ABNORMAL HIGH (ref 65–99)
Glucose-Capillary: 150 mg/dL — ABNORMAL HIGH (ref 65–99)
Glucose-Capillary: 160 mg/dL — ABNORMAL HIGH (ref 65–99)
Glucose-Capillary: 376 mg/dL — ABNORMAL HIGH (ref 65–99)

## 2016-06-24 MED ORDER — HYDRALAZINE HCL 20 MG/ML IJ SOLN
5.0000 mg | Freq: Four times a day (QID) | INTRAMUSCULAR | Status: DC | PRN
  Administered 2016-06-25: 5 mg via INTRAVENOUS
  Filled 2016-06-24: qty 0.25
  Filled 2016-06-24: qty 1

## 2016-06-24 MED ORDER — INSULIN GLARGINE 100 UNIT/ML ~~LOC~~ SOLN
13.0000 [IU] | Freq: Every day | SUBCUTANEOUS | Status: DC
  Administered 2016-06-24: 13 [IU] via SUBCUTANEOUS
  Filled 2016-06-24 (×2): qty 0.13

## 2016-06-24 MED ORDER — INSULIN ASPART 100 UNIT/ML ~~LOC~~ SOLN
0.0000 [IU] | Freq: Three times a day (TID) | SUBCUTANEOUS | Status: DC
  Administered 2016-06-24: 15 [IU] via SUBCUTANEOUS
  Administered 2016-06-24: 3 [IU] via SUBCUTANEOUS
  Administered 2016-06-25: 2 [IU] via SUBCUTANEOUS
  Administered 2016-06-25: 15 [IU] via SUBCUTANEOUS
  Administered 2016-06-26: 2 [IU] via SUBCUTANEOUS
  Administered 2016-06-26: 3 [IU] via SUBCUTANEOUS
  Administered 2016-06-26: 5 [IU] via SUBCUTANEOUS
  Administered 2016-06-27: 3 [IU] via SUBCUTANEOUS
  Administered 2016-06-27: 5 [IU] via SUBCUTANEOUS
  Administered 2016-06-27: 2 [IU] via SUBCUTANEOUS
  Administered 2016-06-28: 8 [IU] via SUBCUTANEOUS

## 2016-06-24 MED ORDER — INSULIN ASPART 100 UNIT/ML ~~LOC~~ SOLN
0.0000 [IU] | Freq: Every day | SUBCUTANEOUS | Status: DC
  Administered 2016-06-24 – 2016-06-26 (×2): 2 [IU] via SUBCUTANEOUS
  Administered 2016-06-27: 3 [IU] via SUBCUTANEOUS

## 2016-06-24 MED ORDER — ONDANSETRON HCL 4 MG/2ML IJ SOLN
4.0000 mg | Freq: Four times a day (QID) | INTRAMUSCULAR | Status: DC | PRN
  Administered 2016-06-24 – 2016-06-26 (×4): 4 mg via INTRAVENOUS
  Filled 2016-06-24 (×4): qty 2

## 2016-06-24 MED ORDER — MORPHINE SULFATE (PF) 2 MG/ML IV SOLN
2.0000 mg | Freq: Once | INTRAVENOUS | Status: AC
  Administered 2016-06-24: 2 mg via INTRAVENOUS
  Filled 2016-06-24: qty 1

## 2016-06-24 MED ORDER — MORPHINE SULFATE (PF) 2 MG/ML IV SOLN
1.0000 mg | INTRAVENOUS | Status: DC | PRN
  Administered 2016-06-24 – 2016-06-28 (×14): 2 mg via INTRAVENOUS
  Filled 2016-06-24 (×15): qty 1

## 2016-06-24 NOTE — Progress Notes (Signed)
PROGRESS NOTE    [name]  BZJ:[id number] DOB: [birth year] DOA: 06/23/2016 PCP: Vicenta Aly, FNP    Brief Narrative: [name] is a 26 y.o. female with history of DM1, hypertension, asthma, diabetic gastroparesis and 9 admissions over the last 6 months who presents again with nausea, vomiting, and abdominal pain. She was found to bein DKA. She was started on insulin gtt and transitioned to insulin sq on 6/6    Assessment & Plan:   Principal Problem:   DKA (diabetic ketoacidoses) ([place]) Active Problems:   Uncontrolled type 1 diabetes mellitus (HCC)   Sinus tachycardia   Hypertension   Intractable nausea and vomiting   Gastroparesis   GERD (gastroesophageal reflux disease)   Candiduria, asymptomatic   DKA: from persistent nausea and vomiting from gastroparesis.  Improved with hydration and insulin gtt.  Gap is closed and bicarb has improved. Transitioned to insulin sq with SSI.  Patient still has persistent nausea, vomiting and abdominal pain.  Would recommend to continue with IV fluids, pain control, iV reglan and IV zofran/ phenergan.  abd film to evaluate for sbo.  Electrolytes wnl.    Sinus tachycardia: improved with hydration.    Mild normocytic anemia stable hemoglobin around 11.   Asthma:  Stable. No wheezing heard.    Hypertension: sub optimal secondary to persistent nausea, vomiting.  Prn hydralazine and resume home meds.   Hypokalemia : replaced.      DVT prophylaxis: (Lovenox) Code Status: (Full) Family Communication: (none at bedside) Disposition Plan: pending further eval..   Consultants:   none   Procedures: none    Antimicrobials: none   Subjective: Reports persistent pain   Objective: Vitals:   06/24/16 0500 06/24/16 0600 06/24/16 0638 06/24/16 0800  BP: 114/69 (!) 181/121 (!) 170/106   Pulse: 94 98 99   Resp: 17 14 13    Temp:    99 F (37.2 C)  TempSrc:    Oral  SpO2: 99% 100% 98%   Weight:        Height:        Intake/Output Summary (Last 24 hours) at 06/24/16 1017 Last data filed at 06/24/16 0751  Gross per 24 hour  Intake          2398.09 ml  Output             3530 ml  Net         -1131.91 ml   Filed Weights   06/23/16 0451  Weight: 52.2 kg (115 lb)    Examination:  General exam: Appears in mod distress from nausea,  Respiratory system: Clear to auscultation. Respiratory effort normal. Cardiovascular system: S1 & S2 heard, RRR. No JVD, murmurs, rubs, gallops or clicks. No pedal edema. Gastrointestinal system: Abdomen is nondistended, soft  Has generalized tenderness.  No organomegaly or masses felt. Normal bowel sounds heard. Central nervous system: Alert and oriented. No focal neurological deficits. Extremities: Symmetric 5 x 5 power. Skin: No rashes, lesions or ulcers Psychiatry: Judgement and insight appear normal. Mood & affect appropriate.     Data Reviewed: I have personally reviewed following labs and imaging studies  CBC:  Recent Labs Lab 06/23/16 0438 06/24/16 0532  WBC 8.1 6.8  HGB 12.3 11.5*  HCT 37.2 34.9*  MCV 88.2 88.1  PLT 384 017   Basic Metabolic Panel:  Recent Labs Lab 06/23/16 0915 06/23/16 1316 06/23/16 1636 06/23/16 2049 06/24/16 0119 06/24/16 0532  NA 139 137 136 137 134* 136  K 4.8 4.3 4.0 3.9  3.3* 3.7  CL 108 113* 111 110 109 110  CO2 13* 15* 15* 18* 18* 20*  GLUCOSE 266* 137* 134* 173* 141* 162*  BUN 18 15 13 11 10 10   CREATININE 0.70 0.56 0.56 0.59 0.44 0.51  CALCIUM 8.9 8.2* 8.5* 8.9 8.6* 8.8*  MG 1.7  --   --   --   --   --   PHOS 3.3  --   --   --   --   --    GFR: Estimated Creatinine Clearance: 87.8 mL/min (by C-G formula based on SCr of 0.51 mg/dL). Liver Function Tests:  Recent Labs Lab 06/23/16 0534  AST 22  ALT 11*  ALKPHOS 92  BILITOT 1.2  PROT 7.8  ALBUMIN 4.5    Recent Labs Lab 06/23/16 0534  LIPASE 14   No results for input(s): AMMONIA in the last 168 hours. Coagulation Profile: No  results for input(s): INR, PROTIME in the last 168 hours. Cardiac Enzymes: No results for input(s): CKTOTAL, CKMB, CKMBINDEX, TROPONINI in the last 168 hours. BNP (last 3 results) No results for input(s): PROBNP in the last 8760 hours. HbA1C: No results for input(s): HGBA1C in the last 72 hours. CBG:  Recent Labs Lab 06/24/16 0531 06/24/16 0258 06/24/16 0738 06/24/16 0843 06/24/16 0954  GLUCAP 143* 150* 145* 149* 157*   Lipid Profile: No results for input(s): CHOL, HDL, LDLCALC, TRIG, CHOLHDL, LDLDIRECT in the last 72 hours. Thyroid Function Tests: No results for input(s): TSH, T4TOTAL, FREET4, T3FREE, THYROIDAB in the last 72 hours. Anemia Panel: No results for input(s): VITAMINB12, FOLATE, FERRITIN, TIBC, IRON, RETICCTPCT in the last 72 hours. Sepsis Labs: No results for input(s): PROCALCITON, LATICACIDVEN in the last 168 hours.  Recent Results (from the past 240 hour(s))  Urine culture     Status: Abnormal   Collection Time: 06/16/16  6:50 PM  Result Value Ref Range Status   Specimen Description URINE, RANDOM  Final   Special Requests NONE  Final   Culture MULTIPLE SPECIES PRESENT, SUGGEST RECOLLECTION (A)  Final   Report Status 06/18/2016 FINAL  Final  Culture, Urine     Status: Abnormal   Collection Time: 06/23/16  6:36 AM  Result Value Ref Range Status   Specimen Description URINE, RANDOM  Final   Special Requests NONE  Final   Culture MULTIPLE SPECIES PRESENT, SUGGEST RECOLLECTION (A)  Final   Report Status 06/24/2016 FINAL  Final  MRSA PCR Screening     Status: None   Collection Time: 06/23/16  8:40 AM  Result Value Ref Range Status   MRSA by PCR NEGATIVE NEGATIVE Final    Comment:        The GeneXpert MRSA Assay (FDA approved for NASAL specimens only), is one component of a comprehensive MRSA colonization surveillance program. It is not intended to diagnose MRSA infection nor to guide or monitor treatment for MRSA infections.          Radiology  Studies: No results found.      Scheduled Meds: . chlorhexidine  15 mL Mouth Rinse BID  . enoxaparin (LOVENOX) injection  40 mg Subcutaneous Q24H  . insulin aspart  0-15 Units Subcutaneous TID WC  . insulin aspart  0-5 Units Subcutaneous QHS  . insulin glargine  13 Units Subcutaneous Daily  . mouth rinse  15 mL Mouth Rinse q12n4p  . metoCLOPramide (REGLAN) injection  10 mg Intravenous Q6H  . metoprolol tartrate  5 mg Intravenous Q6H  . mometasone-formoterol  2 puff  Inhalation BID  . pantoprazole (PROTONIX) IV  40 mg Intravenous BID   Continuous Infusions: . sodium chloride Stopped (06/23/16 0900)  . dextrose 5 % and 0.45% NaCl 125 mL/hr at 06/24/16 0804  . insulin (NOVOLIN-R) infusion 1.9 Units/hr (06/24/16 0958)  . ondansetron (ZOFRAN) IV       LOS: 1 day    Time spent: 4 minutes   Lenox Ladouceur, MD [facility] Pager [phone]  If 7PM-7AM, please contact night-coverage [email or web address] Password [email or web address] 06/24/2016, 10:17 AM

## 2016-06-25 DIAGNOSIS — K219 Gastro-esophageal reflux disease without esophagitis: Secondary | ICD-10-CM

## 2016-06-25 DIAGNOSIS — G43A1 Cyclical vomiting, intractable: Secondary | ICD-10-CM

## 2016-06-25 LAB — GLUCOSE, CAPILLARY
GLUCOSE-CAPILLARY: 341 mg/dL — AB (ref 65–99)
Glucose-Capillary: 198 mg/dL — ABNORMAL HIGH (ref 65–99)
Glucose-Capillary: 221 mg/dL — ABNORMAL HIGH (ref 65–99)
Glucose-Capillary: 368 mg/dL — ABNORMAL HIGH (ref 65–99)
Glucose-Capillary: 141 mg/dL — ABNORMAL HIGH (ref 65–99)
Glucose-Capillary: 112 mg/dL — ABNORMAL HIGH (ref 65–99)
Glucose-Capillary: 193 mg/dL — ABNORMAL HIGH (ref 65–99)

## 2016-06-25 LAB — CBC
HEMATOCRIT: 32.9 % — AB (ref 36.0–46.0)
HEMOGLOBIN: 10.9 g/dL — AB (ref 12.0–15.0)
MCH: 28.9 pg (ref 26.0–34.0)
MCHC: 33.1 g/dL (ref 30.0–36.0)
MCV: 87.3 fL (ref 78.0–100.0)
Platelets: 334 10*3/uL (ref 150–400)
RBC: 3.77 MIL/uL — ABNORMAL LOW (ref 3.87–5.11)
RDW: 14 % (ref 11.5–15.5)
WBC: 4.1 10*3/uL (ref 4.0–10.5)

## 2016-06-25 LAB — BASIC METABOLIC PANEL
ANION GAP: 10 (ref 5–15)
BUN: 8 mg/dL (ref 6–20)
CO2: 18 mmol/L — AB (ref 22–32)
Calcium: 8.7 mg/dL — ABNORMAL LOW (ref 8.9–10.3)
Chloride: 106 mmol/L (ref 101–111)
Creatinine, Ser: 0.69 mg/dL (ref 0.44–1.00)
GFR calc Af Amer: >60 mL/min (ref >60)
GLUCOSE: 380 mg/dL — AB (ref 65–99)
POTASSIUM: 3.9 mmol/L (ref 3.5–5.1)
Sodium: 134 mmol/L — ABNORMAL LOW (ref 135–145)

## 2016-06-25 MED ORDER — SODIUM CHLORIDE 0.9 % IV SOLN
INTRAVENOUS | Status: DC
  Administered 2016-06-25 – 2016-06-27 (×4): via INTRAVENOUS

## 2016-06-25 MED ORDER — INSULIN GLARGINE 100 UNIT/ML ~~LOC~~ SOLN
18.0000 [IU] | Freq: Every day | SUBCUTANEOUS | Status: DC
  Administered 2016-06-25: 18 [IU] via SUBCUTANEOUS
  Filled 2016-06-25 (×2): qty 0.18

## 2016-06-25 NOTE — Progress Notes (Signed)
PROGRESS NOTE    [name]  MGN:[id number] DOB: 1990/11/22 DOA: 06/23/2016 PCP: Vicenta Aly, FNP    Brief Narrative: [name] is a 26 y.o. female with history of DM1, hypertension, asthma, diabetic gastroparesis and 9 admissions over the last 6 months who presents again with nausea, vomiting, and abdominal pain. She was found to bein DKA. She was started on insulin gtt and transitioned to insulin sq on 6/6 . Pt continues to have nausea, and vomited once earlier today.   Assessment & Plan:   Principal Problem:   DKA (diabetic ketoacidoses) ([place]) Active Problems:   Uncontrolled type 1 diabetes mellitus (HCC)   Sinus tachycardia   Hypertension   Intractable nausea and vomiting   Gastroparesis   GERD (gastroesophageal reflux disease)   Candiduria, asymptomatic   DKA: from persistent nausea and vomiting from gastroparesis.  Improved with hydration and insulin gtt.  Gap is closed and bicarb has improved. Transitioned to insulin sq with SSI.  Patient still has persistent nausea, vomiting and abdominal pain.  Would recommend to continue with IV fluids, pain control, iV reglan and IV zofran/ phenergan.  abd film to evaluate for sbo was negative   Electrolytes wnl.  Pt remains tachycardic and symptomatic would continue the same regimen of IV fluids, pain control and IV reglan and IV zofran.    Sinus tachycardia: improved with hydration.    Mild normocytic anemia stable hemoglobin around 11.   Asthma:  Stable. No wheezing heard.    Hypertension: sub optimal secondary to persistent nausea, vomiting.  Prn hydralazine and resume home meds.   Hypokalemia : replaced.   Tachycardia:  Secondary to nausea, and vomiting and dehydration.   Uncontrolled DM:  CBG (last 3)   Recent Labs  06/25/16 0739 06/25/16 1146 06/25/16 1656  GLUCAP 368* 141* 112*    Better controlled after increasing the lantus to 18 units.  Resume SSI. hgba1c is 11.    Anemia:  normocytic and mild, baseline fluctuating between 10 to 12.  [name]      DVT prophylaxis: (Lovenox) Code Status: (Full) Family Communication: (none at bedside) Disposition Plan: pending resolution of the symptoms.    Consultants:   none   Procedures: none    Antimicrobials: none   Subjective: Reports persistent pain , nausea, one episode of vomiting.   Objective: Vitals:   06/25/16 1100 06/25/16 1200 06/25/16 1400 06/25/16 1600  BP:  (!) 161/99 (!) 148/102 119/73  Pulse: (!) 116 (!) 127 (!) 121 (!) 113  Resp: 18 18 14 16   Temp:  98 F (36.7 C)    TempSrc:  Oral    SpO2: 97% 97% 99% 98%  Weight:      Height:        Intake/Output Summary (Last 24 hours) at 06/25/16 1749 Last data filed at 06/25/16 1230  Gross per 24 hour  Intake             1200 ml  Output             2750 ml  Net            -1550 ml   Filed Weights   06/23/16 0451 06/25/16 0618  Weight: 52.2 kg (115 lb) 52.7 kg (116 lb 2.9 oz)    Examination:  General exam: Appears in mod distress from nausea, vomiting.  Respiratory system: Clear to auscultation. Respiratory effort normal. No wheezing or rhonchi.  Cardiovascular system: S1 & S2 heard, tachycardic,  No JVD, murmurs, rubs, gallops or  clicks. No pedal edema. Gastrointestinal system: Abdomen is nondistended, soft  Has moderate generalized tenderness.  No organomegaly or masses felt. Normal bowel sounds heard. Central nervous system: Alert and oriented. No focal neurological deficits. Extremities: Symmetric 5 x 5 power. Skin: No rashes, lesions or ulcers Psychiatry: anxious.      Data Reviewed: I have personally reviewed following labs and imaging studies  CBC:  Recent Labs Lab 06/23/16 0438 06/24/16 0532 06/25/16 0313  WBC 8.1 6.8 4.1  HGB 12.3 11.5* 10.9*  HCT 37.2 34.9* 32.9*  MCV 88.2 88.1 87.3  PLT 384 394 008   Basic Metabolic Panel:  Recent Labs Lab 06/23/16 0915  06/23/16 1636 06/23/16 2049 06/24/16 0119  06/24/16 0532 06/25/16 0313  NA 139  < > 136 137 134* 136 134*  K 4.8  < > 4.0 3.9 3.3* 3.7 3.9  CL 108  < > 111 110 109 110 106  CO2 13*  < > 15* 18* 18* 20* 18*  GLUCOSE 266*  < > 134* 173* 141* 162* 380*  BUN 18  < > 13 11 10 10 8   CREATININE 0.70  < > 0.56 0.59 0.44 0.51 0.69  CALCIUM 8.9  < > 8.5* 8.9 8.6* 8.8* 8.7*  MG 1.7  --   --   --   --   --   --   PHOS 3.3  --   --   --   --   --   --   < > = values in this interval not displayed. GFR: Estimated Creatinine Clearance: 88.7 mL/min (by C-G formula based on SCr of 0.69 mg/dL). Liver Function Tests:  Recent Labs Lab 06/23/16 0534  AST 22  ALT 11*  ALKPHOS 92  BILITOT 1.2  PROT 7.8  ALBUMIN 4.5    Recent Labs Lab 06/23/16 0534  LIPASE 14   No results for input(s): AMMONIA in the last 168 hours. Coagulation Profile: No results for input(s): INR, PROTIME in the last 168 hours. Cardiac Enzymes: No results for input(s): CKTOTAL, CKMB, CKMBINDEX, TROPONINI in the last 168 hours. BNP (last 3 results) No results for input(s): PROBNP in the last 8760 hours. HbA1C: No results for input(s): HGBA1C in the last 72 hours. CBG:  Recent Labs Lab 06/24/16 2126 06/25/16 0340 06/25/16 0739 06/25/16 1146 06/25/16 1656  GLUCAP 221* 341* 368* 141* 112*   Lipid Profile: No results for input(s): CHOL, HDL, LDLCALC, TRIG, CHOLHDL, LDLDIRECT in the last 72 hours. Thyroid Function Tests: No results for input(s): TSH, T4TOTAL, FREET4, T3FREE, THYROIDAB in the last 72 hours. Anemia Panel: No results for input(s): VITAMINB12, FOLATE, FERRITIN, TIBC, IRON, RETICCTPCT in the last 72 hours. Sepsis Labs: No results for input(s): PROCALCITON, LATICACIDVEN in the last 168 hours.  Recent Results (from the past 240 hour(s))  Urine culture     Status: Abnormal   Collection Time: 06/16/16  6:50 PM  Result Value Ref Range Status   Specimen Description URINE, RANDOM  Final   Special Requests NONE  Final   Culture MULTIPLE SPECIES  PRESENT, SUGGEST RECOLLECTION (A)  Final   Report Status 06/18/2016 FINAL  Final  Culture, Urine     Status: Abnormal   Collection Time: 06/23/16  6:36 AM  Result Value Ref Range Status   Specimen Description URINE, RANDOM  Final   Special Requests NONE  Final   Culture MULTIPLE SPECIES PRESENT, SUGGEST RECOLLECTION (A)  Final   Report Status 06/24/2016 FINAL  Final  MRSA PCR Screening  Status: None   Collection Time: 06/23/16  8:40 AM  Result Value Ref Range Status   MRSA by PCR NEGATIVE NEGATIVE Final    Comment:        The GeneXpert MRSA Assay (FDA approved for NASAL specimens only), is one component of a comprehensive MRSA colonization surveillance program. It is not intended to diagnose MRSA infection nor to guide or monitor treatment for MRSA infections.          Radiology Studies: Dg Abd 2 Views  Result Date: 06/24/2016 CLINICAL DATA:  26 year old presenting with acute onset of abdominal pain and nausea and vomiting. Current history of poorly controlled diabetes with prior episodes of diabetic ketoacidosis. EXAM: ABDOMEN - 2 VIEW COMPARISON:  Abdominal x-rays 06/02/2016, 05/31/2016 and earlier. CT abdomen pelvis 02/20/2016 and earlier. Chest x-rays 05/31/2016, 01/17/2016 and earlier. FINDINGS: Bowel gas pattern unremarkable without evidence of obstruction or significant ileus. No evidence of free air or significant air-fluid levels on the erect image. Expected stool burden in the colon. Surgical clips in the right upper quadrant from prior cholecystectomy. Phleboliths low on the left side of the pelvis as noted previously. No opaque urinary tract calculi. Regional skeleton intact. IMPRESSION: No acute abdominal abnormality. Electronically Signed   By: Evangeline Dakin M.D.   On: 06/24/2016 15:36        Scheduled Meds: . chlorhexidine  15 mL Mouth Rinse BID  . enoxaparin (LOVENOX) injection  40 mg Subcutaneous Q24H  . insulin aspart  0-15 Units Subcutaneous TID WC   . insulin aspart  0-5 Units Subcutaneous QHS  . insulin glargine  18 Units Subcutaneous Daily  . mouth rinse  15 mL Mouth Rinse q12n4p  . metoCLOPramide (REGLAN) injection  10 mg Intravenous Q6H  . metoprolol tartrate  5 mg Intravenous Q6H  . mometasone-formoterol  2 puff Inhalation BID  . pantoprazole (PROTONIX) IV  40 mg Intravenous BID   Continuous Infusions: . sodium chloride 100 mL/hr at 06/25/16 1650  . ondansetron (ZOFRAN) IV       LOS: 2 days    Time spent: 39 minutes   Stefanie Diego, MD [facility] Pager [phone]  If 7PM-7AM, please contact night-coverage [email or web address] Password [facility] 06/25/2016, 5:49 PM

## 2016-06-26 DIAGNOSIS — E1010 Type 1 diabetes mellitus with ketoacidosis without coma: Principal | ICD-10-CM

## 2016-06-26 LAB — BASIC METABOLIC PANEL
ANION GAP: 9 (ref 5–15)
BUN: 9 mg/dL (ref 6–20)
CHLORIDE: 106 mmol/L (ref 101–111)
CO2: 19 mmol/L — ABNORMAL LOW (ref 22–32)
Calcium: 8.6 mg/dL — ABNORMAL LOW (ref 8.9–10.3)
Creatinine, Ser: 0.58 mg/dL (ref 0.44–1.00)
GFR calc non Af Amer: >60 mL/min (ref >60)
Glucose, Bld: 246 mg/dL — ABNORMAL HIGH (ref 65–99)
POTASSIUM: 3.6 mmol/L (ref 3.5–5.1)
SODIUM: 134 mmol/L — AB (ref 135–145)

## 2016-06-26 LAB — CBC
HEMATOCRIT: 33 % — AB (ref 36.0–46.0)
HEMOGLOBIN: 11.1 g/dL — AB (ref 12.0–15.0)
MCH: 29.1 pg (ref 26.0–34.0)
MCHC: 33.6 g/dL (ref 30.0–36.0)
MCV: 86.6 fL (ref 78.0–100.0)
Platelets: 329 10*3/uL (ref 150–400)
RBC: 3.81 MIL/uL — AB (ref 3.87–5.11)
RDW: 13.9 % (ref 11.5–15.5)
WBC: 4.8 10*3/uL (ref 4.0–10.5)

## 2016-06-26 LAB — GLUCOSE, CAPILLARY
GLUCOSE-CAPILLARY: 147 mg/dL — AB (ref 65–99)
GLUCOSE-CAPILLARY: 202 mg/dL — AB (ref 65–99)
Glucose-Capillary: 233 mg/dL — ABNORMAL HIGH (ref 65–99)
Glucose-Capillary: 234 mg/dL — ABNORMAL HIGH (ref 65–99)
Glucose-Capillary: 171 mg/dL — ABNORMAL HIGH (ref 65–99)

## 2016-06-26 MED ORDER — INSULIN GLARGINE 100 UNIT/ML ~~LOC~~ SOLN
20.0000 [IU] | Freq: Every day | SUBCUTANEOUS | Status: DC
  Administered 2016-06-26: 20 [IU] via SUBCUTANEOUS
  Filled 2016-06-26 (×2): qty 0.2

## 2016-06-26 NOTE — Progress Notes (Signed)
Inpatient Diabetes Program Recommendations  AACE/ADA: New Consensus Statement on Inpatient Glycemic Control (2015)  Target Ranges:  Prepandial:   less than 140 mg/dL      Peak postprandial:   less than 180 mg/dL (1-2 hours)      Critically ill patients:  140 - 180 mg/dL   Lab Results  Component Value Date   GLUCAP 171 (H) 06/26/2016   HGBA1C 8.3 (H) 06/05/2016    Review of Glycemic Control  Very familiar with pt from previous visits. Pt continues to claim she takes her insulin, even when she is nauseated and vomiting with gastroparesis. "I'm tired of being here."  Needs meal coverage insulin when eating CHO mod diet, > 50% intake.   Inpatient Diabetes Program Recommendations:   Add Novolog 4 units tidwc for meal coverage insulin.  Thank you. Lorenda Peck, RD, LDN, CDE Inpatient Diabetes Coordinator [phone]

## 2016-06-26 NOTE — Progress Notes (Signed)
Date:  June 26, 2016 Chart reviewed for concurrent status and case management needs. Will continue to follow patient progress. Continues to have vomiting in patient with know gastroporesis and dka. Discharge Planning: following for needs Expected discharge date: [id number] Velva Harman, BSN, [place], [place]

## 2016-06-26 NOTE — Progress Notes (Signed)
PROGRESS NOTE    [name]  KZL:[id number] DOB: 06/22/1990 DOA: 06/23/2016 PCP: Vicenta Aly, FNP    Brief Narrative: [name] a 26 y.o.femalewith history of DM1, hypertension, asthma, diabetic gastroparesis and 9 admissions over the last 6 months who presents again with nausea, vomiting, and abdominal pain. She was found to bein DKA. She was started on insulin gtt and transitioned to insulin sq on 6/6 .  Patient symptoms of nausea, vomiting and abd pain have improved.  Last vomiting was last night.   Assessment & Plan:   Principal Problem:   DKA (diabetic ketoacidoses) ([place]) Active Problems:   Uncontrolled type 1 diabetes mellitus (HCC)   Sinus tachycardia   Hypertension   Intractable nausea and vomiting   Gastroparesis   GERD (gastroesophageal reflux disease)   Candiduria, asymptomatic   DKA:  Resolved with insulin gtt.  Transitioned to insulin sq.  Electrolytes wnl.    Intractable nausea and vomiting, abdominal pain: sec to gastroparesis.  Improved with hydration and IV pain control, IV reglan and IV phenergan.  Advance diet today to full liquid diet.   GERD: ON iv protonix.    Uncontrolled DM:  CBG (last 3)   Recent Labs  06/25/16 1656 06/25/16 2148 06/26/16 0319  GLUCAP 112* 193* 233*    cbg's have been much better in the last 24 hours.  Increase the lantus to 20 units daily. And resume the same SSI.   HYPERTENSION;  Better controlled today.    Tachycardia.: rate is in 90's.  Possibly from nausea, vomiting and dehydration.  Resume IV fluids.     DVT prophylaxis: scd's Code Status: full code.  Family Communication: none at bedside. Discussed the plan of car with the patient.  Disposition Plan: Home in 1 to 2 days.    Consultants:   None.    Procedures: none.   Antimicrobials: none.   Subjective: Nausea, vomiting and abd pain better than yesterday;, but not completely gone.   Objective: Vitals:   06/26/16  0200 06/26/16 0326 06/26/16 0400 06/26/16 0500  BP:   103/60   Pulse: 99  95 97  Resp: 17  16 17   Temp:  98.1 F (36.7 C)    TempSrc:  Oral    SpO2: 100%  98% 98%  Weight:      Height:        Intake/Output Summary (Last 24 hours) at 06/26/16 0725 Last data filed at 06/25/16 2200  Gross per 24 hour  Intake              875 ml  Output             1650 ml  Net             -775 ml   Filed Weights   06/23/16 0451 06/25/16 0618  Weight: 52.2 kg (115 lb) 52.7 kg (116 lb 2.9 oz)    Examination:  General exam: Appears calm and comfortable  Respiratory system: Clear to auscultation. Respiratory effort normal. No wheezing or rhonchi.  Cardiovascular system: S1 & S2 heard, RRR. No JVD, murmurs, rubs, gallops or clicks. No pedal edema. Gastrointestinal system: Abdomen is nondistended, soft and mild gen tenderness.  No organomegaly or masses felt. Normal bowel sounds heard. Central nervous system: Alert and oriented. No focal neurological deficits. Extremities: Symmetric 5 x 5 power. Skin: No rashes, lesions or ulcers Psychiatry: Judgement and insight appear normal. Mood & affect appropriate.     Data Reviewed: I have personally reviewed following  labs and imaging studies  CBC:  Recent Labs Lab 06/23/16 0438 06/24/16 0532 06/25/16 0313 06/26/16 0314  WBC 8.1 6.8 4.1 4.8  HGB 12.3 11.5* 10.9* 11.1*  HCT 37.2 34.9* 32.9* 33.0*  MCV 88.2 88.1 87.3 86.6  PLT 384 394 334 026   Basic Metabolic Panel:  Recent Labs Lab 06/23/16 0915  06/23/16 2049 06/24/16 0119 06/24/16 0532 06/25/16 0313 06/26/16 0314  NA 139  < > 137 134* 136 134* 134*  K 4.8  < > 3.9 3.3* 3.7 3.9 3.6  CL 108  < > 110 109 110 106 106  CO2 13*  < > 18* 18* 20* 18* 19*  GLUCOSE 266*  < > 173* 141* 162* 380* 246*  BUN 18  < > 11 10 10 8 9   CREATININE 0.70  < > 0.59 0.44 0.51 0.69 0.58  CALCIUM 8.9  < > 8.9 8.6* 8.8* 8.7* 8.6*  MG 1.7  --   --   --   --   --   --   PHOS 3.3  --   --   --   --   --   --    < > = values in this interval not displayed. GFR: Estimated Creatinine Clearance: 88.7 mL/min (by C-G formula based on SCr of 0.58 mg/dL). Liver Function Tests:  Recent Labs Lab 06/23/16 0534  AST 22  ALT 11*  ALKPHOS 92  BILITOT 1.2  PROT 7.8  ALBUMIN 4.5    Recent Labs Lab 06/23/16 0534  LIPASE 14   No results for input(s): AMMONIA in the last 168 hours. Coagulation Profile: No results for input(s): INR, PROTIME in the last 168 hours. Cardiac Enzymes: No results for input(s): CKTOTAL, CKMB, CKMBINDEX, TROPONINI in the last 168 hours. BNP (last 3 results) No results for input(s): PROBNP in the last 8760 hours. HbA1C: No results for input(s): HGBA1C in the last 72 hours. CBG:  Recent Labs Lab 06/25/16 0739 06/25/16 1146 06/25/16 1656 06/25/16 2148 06/26/16 0319  GLUCAP 368* 141* 112* 193* 233*   Lipid Profile: No results for input(s): CHOL, HDL, LDLCALC, TRIG, CHOLHDL, LDLDIRECT in the last 72 hours. Thyroid Function Tests: No results for input(s): TSH, T4TOTAL, FREET4, T3FREE, THYROIDAB in the last 72 hours. Anemia Panel: No results for input(s): VITAMINB12, FOLATE, FERRITIN, TIBC, IRON, RETICCTPCT in the last 72 hours. Sepsis Labs: No results for input(s): PROCALCITON, LATICACIDVEN in the last 168 hours.  Recent Results (from the past 240 hour(s))  Urine culture     Status: Abnormal   Collection Time: 06/16/16  6:50 PM  Result Value Ref Range Status   Specimen Description URINE, RANDOM  Final   Special Requests NONE  Final   Culture MULTIPLE SPECIES PRESENT, SUGGEST RECOLLECTION (A)  Final   Report Status 06/18/2016 FINAL  Final  Culture, Urine     Status: Abnormal   Collection Time: 06/23/16  6:36 AM  Result Value Ref Range Status   Specimen Description URINE, RANDOM  Final   Special Requests NONE  Final   Culture MULTIPLE SPECIES PRESENT, SUGGEST RECOLLECTION (A)  Final   Report Status 06/24/2016 FINAL  Final  MRSA PCR Screening     Status: None    Collection Time: 06/23/16  8:40 AM  Result Value Ref Range Status   MRSA by PCR NEGATIVE NEGATIVE Final    Comment:        The GeneXpert MRSA Assay (FDA approved for NASAL specimens only), is one component of a comprehensive MRSA  colonization surveillance program. It is not intended to diagnose MRSA infection nor to guide or monitor treatment for MRSA infections.          Radiology Studies: Dg Abd 2 Views  Result Date: 06/24/2016 CLINICAL DATA:  26 year old presenting with acute onset of abdominal pain and nausea and vomiting. Current history of poorly controlled diabetes with prior episodes of diabetic ketoacidosis. EXAM: ABDOMEN - 2 VIEW COMPARISON:  Abdominal x-rays 06/02/2016, 05/31/2016 and earlier. CT abdomen pelvis 02/20/2016 and earlier. Chest x-rays 05/31/2016, 01/17/2016 and earlier. FINDINGS: Bowel gas pattern unremarkable without evidence of obstruction or significant ileus. No evidence of free air or significant air-fluid levels on the erect image. Expected stool burden in the colon. Surgical clips in the right upper quadrant from prior cholecystectomy. Phleboliths low on the left side of the pelvis as noted previously. No opaque urinary tract calculi. Regional skeleton intact. IMPRESSION: No acute abdominal abnormality. Electronically Signed   By: Evangeline Dakin M.D.   On: 06/24/2016 15:36        Scheduled Meds: . chlorhexidine  15 mL Mouth Rinse BID  . enoxaparin (LOVENOX) injection  40 mg Subcutaneous Q24H  . insulin aspart  0-15 Units Subcutaneous TID WC  . insulin aspart  0-5 Units Subcutaneous QHS  . insulin glargine  18 Units Subcutaneous Daily  . mouth rinse  15 mL Mouth Rinse q12n4p  . metoCLOPramide (REGLAN) injection  10 mg Intravenous Q6H  . metoprolol tartrate  5 mg Intravenous Q6H  . mometasone-formoterol  2 puff Inhalation BID  . pantoprazole (PROTONIX) IV  40 mg Intravenous BID   Continuous Infusions: . sodium chloride 100 mL/hr at 06/25/16  1650  . ondansetron (ZOFRAN) IV       LOS: 3 days    Time spent: 35 minutes.     Hosie Poisson, MD [facility] Pager [phone]   If 7PM-7AM, please contact night-coverage [email or web address] Password [email or web address] 06/26/2016, 7:25 AM

## 2016-06-27 LAB — GLUCOSE, CAPILLARY
GLUCOSE-CAPILLARY: 183 mg/dL — AB (ref 65–99)
GLUCOSE-CAPILLARY: 259 mg/dL — AB (ref 65–99)
Glucose-Capillary: 237 mg/dL — ABNORMAL HIGH (ref 65–99)
Glucose-Capillary: 134 mg/dL — ABNORMAL HIGH (ref 65–99)

## 2016-06-27 LAB — BASIC METABOLIC PANEL
Anion gap: 8 (ref 5–15)
BUN: 8 mg/dL (ref 6–20)
CHLORIDE: 107 mmol/L (ref 101–111)
CO2: 21 mmol/L — AB (ref 22–32)
CREATININE: 0.56 mg/dL (ref 0.44–1.00)
Calcium: 8.5 mg/dL — ABNORMAL LOW (ref 8.9–10.3)
GFR calc non Af Amer: >60 mL/min (ref >60)
Glucose, Bld: 254 mg/dL — ABNORMAL HIGH (ref 65–99)
POTASSIUM: 3.2 mmol/L — AB (ref 3.5–5.1)
Sodium: 136 mmol/L (ref 135–145)

## 2016-06-27 LAB — CBC
HEMATOCRIT: 31.3 % — AB (ref 36.0–46.0)
Hemoglobin: 10.5 g/dL — ABNORMAL LOW (ref 12.0–15.0)
MCH: 28.9 pg (ref 26.0–34.0)
MCHC: 33.5 g/dL (ref 30.0–36.0)
MCV: 86.2 fL (ref 78.0–100.0)
PLATELETS: 299 10*3/uL (ref 150–400)
RBC: 3.63 MIL/uL — AB (ref 3.87–5.11)
RDW: 13.9 % (ref 11.5–15.5)
WBC: 4.9 10*3/uL (ref 4.0–10.5)

## 2016-06-27 MED ORDER — INSULIN GLARGINE 100 UNIT/ML ~~LOC~~ SOLN
22.0000 [IU] | Freq: Every day | SUBCUTANEOUS | Status: DC
  Administered 2016-06-27 – 2016-06-28 (×2): 22 [IU] via SUBCUTANEOUS
  Filled 2016-06-27 (×2): qty 0.22

## 2016-06-27 MED ORDER — POTASSIUM CHLORIDE CRYS ER 20 MEQ PO TBCR
40.0000 meq | EXTENDED_RELEASE_TABLET | Freq: Two times a day (BID) | ORAL | Status: AC
  Administered 2016-06-27 (×2): 40 meq via ORAL
  Filled 2016-06-27 (×2): qty 2

## 2016-06-27 NOTE — Progress Notes (Signed)
PROGRESS NOTE    [name]  CLE:[id number] DOB: October 29, 1990 DOA: 06/23/2016 PCP: Vicenta Aly, FNP    Brief Narrative: 26 year old lady with h/o type DM, hypertension, asthma, DM gastroparesis, multiple admissions over the past year comes in for recurrent and persistent nausea, vomiting and abdominal pain.  She was initially found to be in DKA, which has resolved now, but she continues to have persistent gastroparesis symptoms . She was has been on liquid diet since admission, and over the last 24 hours, her symptoms have slightly improved and she would like to try solid food.   Assessment & Plan:   Principal Problem:   DKA (diabetic ketoacidoses) ([place]) Active Problems:   Uncontrolled type 1 diabetes mellitus ([facility])   Sinus tachycardia   Hypertension   Intractable nausea and vomiting   Gastroparesis   GERD (gastroesophageal reflux disease)   Candiduria, asymptomatic   DKA: resolved. Transitioned to sq insulin and SSI.  CBG (last 3)   Recent Labs  06/26/16 1653 06/26/16 2237 06/27/16 0807  GLUCAP 147* 202* 237*  bicarb is 21 and anion gap closed.  Replete potassium .  She denies having diarrhea or urinary symptoms. She is afebrile and no leukocytosis.    Gastroparesis:  Would explain her symptoms of nausea, vomiting and abdominal pain.  Small meals, multiple times of the day,  Advance diet as tolerated.  IV reglan, phenergan and IV zofran as needed. IV protonix ordered.    Uncontrolled DM:  CBG (last 3)   Recent Labs  06/26/16 1653 06/26/16 2237 06/27/16 0807  GLUCAP 147* 202* 237*   INCREASED his lantus to 22 units, resume SSI.    Hypertension:  Better controlled this am.  Prn hydration.    Asthma:  Not wheezing .   Hypokalemia: replaced. Repeat in am.     DVT prophylaxis: (Lovenox) Code Status: (Full) Family Communication: (none at bedside.) Disposition Plan: possibly home in 1 to 2 days,when symptoms improve.    Consultants:    None.    Procedures: none   Antimicrobials: none.   Subjective: No nausea, no vomiting since last evening. No abdominal pain.   Objective: Vitals:   06/27/16 0400 06/27/16 0500 06/27/16 0700 06/27/16 0800  BP: (!) 159/90   (!) 131/93  Pulse: 95 91 85 86  Resp: (!) 22 14 13 12   Temp:      TempSrc:      SpO2: 95% 98% 98% 98%  Weight:      Height:        Intake/Output Summary (Last 24 hours) at 06/27/16 0906 Last data filed at 06/26/16 2116  Gross per 24 hour  Intake             1100 ml  Output             1500 ml  Net             -400 ml   Filed Weights   06/23/16 0451 06/25/16 0618  Weight: 52.2 kg (115 lb) 52.7 kg (116 lb 2.9 oz)    Examination:  General exam: Appears calm and comfortable  Respiratory system: Clear to auscultation. Respiratory effort normal. Cardiovascular system: S1 & S2 heard, RRR. No JVD, murmurs, rubs, gallops or clicks. No pedal edema. Gastrointestinal system: Abdomen is nondistended, soft and nontender. No organomegaly or masses felt. Normal bowel sounds heard. Central nervous system: Alert and oriented. No focal neurological deficits. Extremities: Symmetric 5 x 5 power. Skin: No rashes, lesions or ulcers Psychiatry:  Judgement and insight appear normal. Mood & affect appropriate.     Data Reviewed: I have personally reviewed following labs and imaging studies  CBC:  Recent Labs Lab 06/23/16 0438 06/24/16 0532 06/25/16 0313 06/26/16 0314 06/27/16 0325  WBC 8.1 6.8 4.1 4.8 4.9  HGB 12.3 11.5* 10.9* 11.1* 10.5*  HCT 37.2 34.9* 32.9* 33.0* 31.3*  MCV 88.2 88.1 87.3 86.6 86.2  PLT 384 394 334 329 573   Basic Metabolic Panel:  Recent Labs Lab 06/23/16 0915  06/24/16 0119 06/24/16 0532 06/25/16 0313 06/26/16 0314 06/27/16 0325  NA 139  < > 134* 136 134* 134* 136  K 4.8  < > 3.3* 3.7 3.9 3.6 3.2*  CL 108  < > 109 110 106 106 107  CO2 13*  < > 18* 20* 18* 19* 21*  GLUCOSE 266*  < > 141* 162* 380* 246* 254*  BUN 18  < >  10 10 8 9 8   CREATININE 0.70  < > 0.44 0.51 0.69 0.58 0.56  CALCIUM 8.9  < > 8.6* 8.8* 8.7* 8.6* 8.5*  MG 1.7  --   --   --   --   --   --   PHOS 3.3  --   --   --   --   --   --   < > = values in this interval not displayed. GFR: Estimated Creatinine Clearance: 88.7 mL/min (by C-G formula based on SCr of 0.56 mg/dL). Liver Function Tests:  Recent Labs Lab 06/23/16 0534  AST 22  ALT 11*  ALKPHOS 92  BILITOT 1.2  PROT 7.8  ALBUMIN 4.5    Recent Labs Lab 06/23/16 0534  LIPASE 14   No results for input(s): AMMONIA in the last 168 hours. Coagulation Profile: No results for input(s): INR, PROTIME in the last 168 hours. Cardiac Enzymes: No results for input(s): CKTOTAL, CKMB, CKMBINDEX, TROPONINI in the last 168 hours. BNP (last 3 results) No results for input(s): PROBNP in the last 8760 hours. HbA1C: No results for input(s): HGBA1C in the last 72 hours. CBG:  Recent Labs Lab 06/26/16 0834 06/26/16 1150 06/26/16 1653 06/26/16 2237 06/27/16 0807  GLUCAP 234* 171* 147* 202* 237*   Lipid Profile: No results for input(s): CHOL, HDL, LDLCALC, TRIG, CHOLHDL, LDLDIRECT in the last 72 hours. Thyroid Function Tests: No results for input(s): TSH, T4TOTAL, FREET4, T3FREE, THYROIDAB in the last 72 hours. Anemia Panel: No results for input(s): VITAMINB12, FOLATE, FERRITIN, TIBC, IRON, RETICCTPCT in the last 72 hours. Sepsis Labs: No results for input(s): PROCALCITON, LATICACIDVEN in the last 168 hours.  Recent Results (from the past 240 hour(s))  Culture, Urine     Status: Abnormal   Collection Time: 06/23/16  6:36 AM  Result Value Ref Range Status   Specimen Description URINE, RANDOM  Final   Special Requests NONE  Final   Culture MULTIPLE SPECIES PRESENT, SUGGEST RECOLLECTION (A)  Final   Report Status 06/24/2016 FINAL  Final  MRSA PCR Screening     Status: None   Collection Time: 06/23/16  8:40 AM  Result Value Ref Range Status   MRSA by PCR NEGATIVE NEGATIVE Final     Comment:        The GeneXpert MRSA Assay (FDA approved for NASAL specimens only), is one component of a comprehensive MRSA colonization surveillance program. It is not intended to diagnose MRSA infection nor to guide or monitor treatment for MRSA infections.          Radiology  Studies: No results found.      Scheduled Meds: . chlorhexidine  15 mL Mouth Rinse BID  . enoxaparin (LOVENOX) injection  40 mg Subcutaneous Q24H  . insulin aspart  0-15 Units Subcutaneous TID WC  . insulin aspart  0-5 Units Subcutaneous QHS  . insulin glargine  22 Units Subcutaneous Daily  . mouth rinse  15 mL Mouth Rinse q12n4p  . metoCLOPramide (REGLAN) injection  10 mg Intravenous Q6H  . metoprolol tartrate  5 mg Intravenous Q6H  . mometasone-formoterol  2 puff Inhalation BID  . pantoprazole (PROTONIX) IV  40 mg Intravenous BID  . potassium chloride  40 mEq Oral BID   Continuous Infusions: . sodium chloride 100 mL/hr at 06/26/16 2249  . ondansetron (ZOFRAN) IV       LOS: 4 days    Time spent: 30 minutes.     Hosie Poisson, MD [facility] Pager [phone] If 7PM-7AM, please contact night-coverage [email or web address] Password [email or web address] 06/27/2016, 9:06 AM

## 2016-06-28 LAB — CBC
HCT: 31.1 % — ABNORMAL LOW (ref 36.0–46.0)
Hemoglobin: 10.6 g/dL — ABNORMAL LOW (ref 12.0–15.0)
MCH: 28.8 pg (ref 26.0–34.0)
MCHC: 34.1 g/dL (ref 30.0–36.0)
MCV: 84.5 fL (ref 78.0–100.0)
Platelets: 308 10*3/uL (ref 150–400)
RBC: 3.68 MIL/uL — ABNORMAL LOW (ref 3.87–5.11)
RDW: 13.6 % (ref 11.5–15.5)
WBC: 5.4 10*3/uL (ref 4.0–10.5)

## 2016-06-28 LAB — BASIC METABOLIC PANEL
Anion gap: 7 (ref 5–15)
BUN: 8 mg/dL (ref 6–20)
CHLORIDE: 104 mmol/L (ref 101–111)
CO2: 26 mmol/L (ref 22–32)
CREATININE: 0.53 mg/dL (ref 0.44–1.00)
Calcium: 8.6 mg/dL — ABNORMAL LOW (ref 8.9–10.3)
GFR calc Af Amer: >60 mL/min (ref >60)
GFR calc non Af Amer: >60 mL/min (ref >60)
Glucose, Bld: 303 mg/dL — ABNORMAL HIGH (ref 65–99)
Potassium: 3.8 mmol/L (ref 3.5–5.1)
SODIUM: 137 mmol/L (ref 135–145)

## 2016-06-28 LAB — GLUCOSE, CAPILLARY: GLUCOSE-CAPILLARY: 276 mg/dL — AB (ref 65–99)

## 2016-06-28 MED ORDER — INSULIN GLARGINE 100 UNIT/ML ~~LOC~~ SOLN: 22.0000 [IU] | Freq: Every day | SUBCUTANEOUS | 1 refills | Status: DC

## 2016-06-28 MED ORDER — METOCLOPRAMIDE HCL 10 MG PO TABS: 10.0000 mg | ORAL_TABLET | Freq: Three times a day (TID) | ORAL | 0 refills | Status: DC

## 2016-06-28 NOTE — Progress Notes (Signed)
  Pt would like to speak with someone about getting her meds, when she is at home. She is currently unemployed due to so many hospitalizations. She would like to know some of her options as far as medicaid, or help with empolyment, or FMALA options once she is employed. She is currently living with her mom and young child, with no way to support herself.

## 2016-06-28 NOTE — Discharge Summary (Signed)
Physician Discharge Summary  [name] OFB:[id number] DOB: 06-May-1990 DOA: 06/23/2016  PCP: Vicenta Aly, FNP  Admit date: 06/23/2016 Discharge date: 06/28/2016  Admitted From: [facility].  Disposition:  Home.   Recommendations for Outpatient Follow-up:  1. Follow up with PCP in 1-2 weeks 2. Please obtain BMP/CBC in one week 3. Please follow up with endocrinology in 2 weeks.     Discharge Condition: stable.  CODE STATUS:full code.  Diet recommendation: Heart Healthy  Brief/Interim Summary:26 year old lady with h/o type DM, hypertension, asthma, DM gastroparesis, multiple admissions over the past year comes in for recurrent and persistent nausea, vomiting and abdominal pain.  She was initially found to be in DKA, which has resolved now, but she continues to have persistent gastroparesis symptoms . She was started on liquid diet and advanced to solid diet.  She was able to tolerate without any symptoms and discharged home on increased dose of insulin.   Discharge Diagnoses:  Principal Problem:   DKA (diabetic ketoacidoses) ([place]) Active Problems:   Uncontrolled type 1 diabetes mellitus ([facility])   Sinus tachycardia   Hypertension   Intractable nausea and vomiting   Gastroparesis   GERD (gastroesophageal reflux disease)   Candiduria, asymptomatic  DKA: resolved. Transitioned to sq insulin and SSI.  CBG (last 3)   Recent Labs (last 2 labs)    Recent Labs  06/26/16 1653 06/26/16 2237 06/27/16 0807  GLUCAP 147* 202* 237*    bicarb is 21 and anion gap closed.  Repleted potassium .  She denies having diarrhea or urinary symptoms. She is afebrile and no leukocytosis.    Gastroparesis:  Would explain her symptoms of nausea, vomiting and abdominal pain.  Small meals, multiple times of the day,  Advanced diet as tolerated.  IV reglan, phenergan and IV zofran as needed since admission, her symptoms have improved and she is discharged on oral anti  emetics.    Uncontrolled DM:  Increased her lantus to 22 units daily. Resume SSI on discharge.    Hypertension:  Better controlled this am.     Asthma:  Not wheezing .   Hypokalemia: replaced.      Discharge Instructions  Discharge Instructions    Diet Carb Modified    Complete by:  As directed    Discharge instructions    Complete by:  As directed    Please follow up with PCP in one week, post hospitalization visit.     Allergies as of 06/28/2016      Reactions   Peanut-containing Drug Products Swelling, Other (See Comments)   Reaction:  Swelling of mouth and lips    Food Swelling, Other (See Comments)   Pt is allergic to strawberries.   Reaction:  Swelling of mouth and lips    Ultram [tramadol] Itching      Medication List    STOP taking these medications   ketorolac 10 MG tablet Commonly known as:  TORADOL     TAKE these medications   albuterol 108 (90 Base) MCG/ACT inhaler Commonly known as:  PROVENTIL HFA;VENTOLIN HFA Inhale 2 puffs into the lungs every 6 (six) hours as needed for wheezing or shortness of breath.   albuterol (2.5 MG/3ML) 0.083% nebulizer solution Commonly known as:  PROVENTIL Take 2.5 mg by nebulization every 6 (six) hours as needed for wheezing or shortness of breath.   budesonide-formoterol 160-4.5 MCG/ACT inhaler Commonly known as:  SYMBICORT Inhale 2 puffs into the lungs 2 (two) times daily.   famotidine 20 MG tablet Commonly  known as:  PEPCID Take 1 tablet (20 mg total) by mouth at bedtime.   glucagon 1 MG injection Commonly known as:  GLUCAGON EMERGENCY Inject 1 mg into the vein once as needed.   insulin glargine 100 UNIT/ML injection Commonly known as:  LANTUS Inject 0.22 mLs (22 Units total) into the skin daily. What changed:  how much to take   metoCLOPramide 10 MG tablet Commonly known as:  REGLAN Take 1 tablet (10 mg total) by mouth 3 (three) times daily before meals.   metoprolol tartrate 25 MG  tablet Commonly known as:  LOPRESSOR Take 0.5 tablets (12.5 mg total) by mouth 2 (two) times daily.   NOVOLOG FLEXPEN 100 UNIT/ML FlexPen Generic drug:  insulin aspart Inject into the skin See admin instructions. Pt uses 1 unit for every 10 grams of carbs with meals and snacks for BS greater than 50.   pantoprazole 40 MG tablet Commonly known as:  PROTONIX Take 1 tablet (40 mg total) by mouth 2 (two) times daily.   promethazine 25 MG tablet Commonly known as:  PHENERGAN Take 1 tablet (25 mg total) by mouth every 6 (six) hours as needed for nausea or vomiting.   Vitamin D (Ergocalciferol) 50000 units Caps capsule Commonly known as:  DRISDOL Take 50,000 Units by mouth once a week.      Follow-up Information    Vicenta Aly, [facility]. Schedule an appointment as soon as possible for a visit in 1 week(s).   Specialty:  Nurse Practitioner Why:  post hospitalization visit.  Contact information: [place] [address] [phone]          Allergies  Allergen Reactions  . Peanut-Containing Drug Products Swelling and Other (See Comments)    Reaction:  Swelling of mouth and lips   . 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   Reaction:  Swelling of mouth and lips   . Ultram [Tramadol] Itching    Consultations:  None.    Procedures/Studies: Dg Chest Port 1 View  Result Date: 06/03/2016 CLINICAL DATA:  Tachypnea and tachycardia. EXAM: PORTABLE CHEST 1 VIEW COMPARISON:  05/31/2016 and prior exams FINDINGS: The cardiomediastinal silhouette is unremarkable. There is no evidence of focal airspace disease, pulmonary edema, suspicious pulmonary nodule/mass, pleural effusion, or pneumothorax. No acute bony abnormalities are identified. An NG tube is identified with tip overlying the mid stomach. IMPRESSION: NG tube with tip overlying the mid stomach. No evidence of acute cardiopulmonary disease. Electronically Signed   By:  Margarette Canada M.D.   On: 06/03/2016 20:49   Dg Abd 2 Views  Result Date: 06/24/2016 CLINICAL DATA:  26 year old presenting with acute onset of abdominal pain and nausea and vomiting. Current history of poorly controlled diabetes with prior episodes of diabetic ketoacidosis. EXAM: ABDOMEN - 2 VIEW COMPARISON:  Abdominal x-rays 06/02/2016, 05/31/2016 and earlier. CT abdomen pelvis 02/20/2016 and earlier. Chest x-rays 05/31/2016, 01/17/2016 and earlier. FINDINGS: Bowel gas pattern unremarkable without evidence of obstruction or significant ileus. No evidence of free air or significant air-fluid levels on the erect image. Expected stool burden in the colon. Surgical clips in the right upper quadrant from prior cholecystectomy. Phleboliths low on the left side of the pelvis as noted previously. No opaque urinary tract calculi. Regional skeleton intact. IMPRESSION: No acute abdominal abnormality. Electronically Signed   By: Evangeline Dakin M.D.   On: 06/24/2016 15:36   Dg Abd Acute W/chest  Result Date: 06/01/2016 CLINICAL  DATA:  Nausea and vomiting. EXAM: DG ABDOMEN ACUTE W/ 1V CHEST COMPARISON:  Chest radiograph 05/14/2016, abdominal CT 02/20/2016 FINDINGS: Lower lung volumes from prior exam accentuating cardiac size. No consolidation or pleural fluid. No evidence pulmonary edema. Normal bowel gas pattern. No dilated bowel loops to suggest obstruction. Small to moderate stool burden. No evidence of free air. Cholecystectomy clips in the right upper quadrant. No radiopaque calculi. No osseous abnormalities. IMPRESSION: Normal bowel gas pattern.  No evidence of obstruction or free air. Low lung volumes without acute abnormality. Electronically Signed   By: Jeb Levering M.D.   On: 06/01/2016 00:32   Dg Abd Portable 1v  Result Date: 06/02/2016 CLINICAL DATA:  Evaluate NG tube placement EXAM: PORTABLE ABDOMEN - 1 VIEW COMPARISON:  May 31, 2016 FINDINGS: The side port of the NG tube terminates just below the GE  junction. The distal tip is in the body of the stomach. IMPRESSION: NG tube placement as above. Electronically Signed   By: Dorise Bullion III M.D   On: 06/02/2016 18:44       Subjective: No new complaints, no nausea, vomiting or abdominal pain.   Discharge Exam: Vitals:   06/28/16 0000 06/28/16 0518  BP: 120/74 (!) 131/97  Pulse: 93 89  Resp:  17  Temp:  98.6 F (37 C)   Vitals:   06/27/16 1658 06/27/16 2049 06/28/16 0000 06/28/16 0518  BP: 123/86 134/89 120/74 (!) 131/97  Pulse: 92 93 93 89  Resp: 16 16  17   Temp: 99.4 F (37.4 C) 98.8 F (37.1 C)  98.6 F (37 C)  TempSrc:  Oral  Oral  SpO2: 100% 100% 100% 99%  Weight:      Height:        General: Pt is alert, awake, not in acute distress Cardiovascular: RRR, S1/S2 +, no rubs, no gallops Respiratory: CTA bilaterally, no wheezing, no rhonchi Abdominal: Soft, NT, ND, bowel sounds + Extremities: no edema, no cyanosis    The results of significant diagnostics from this hospitalization (including imaging, microbiology, ancillary and laboratory) are listed below for reference.     Microbiology: Recent Results (from the past 240 hour(s))  Culture, Urine     Status: Abnormal   Collection Time: 06/23/16  6:36 AM  Result Value Ref Range Status   Specimen Description URINE, RANDOM  Final   Special Requests NONE  Final   Culture MULTIPLE SPECIES PRESENT, SUGGEST RECOLLECTION (A)  Final   Report Status 06/24/2016 FINAL  Final  MRSA PCR Screening     Status: None   Collection Time: 06/23/16  8:40 AM  Result Value Ref Range Status   MRSA by PCR NEGATIVE NEGATIVE Final    Comment:        The GeneXpert MRSA Assay (FDA approved for NASAL specimens only), is one component of a comprehensive MRSA colonization surveillance program. It is not intended to diagnose MRSA infection nor to guide or monitor treatment for MRSA infections.      Labs: BNP (last 3 results)  Recent Labs  07/11/15 0353  BNP 6.9   Basic  Metabolic Panel:  Recent Labs Lab 06/23/16 0915  06/24/16 0532 06/25/16 0313 06/26/16 0314 06/27/16 0325 06/28/16 0629  NA 139  < > 136 134* 134* 136 137  K 4.8  < > 3.7 3.9 3.6 3.2* 3.8  CL 108  < > 110 106 106 107 104  CO2 13*  < > 20* 18* 19* 21* 26  GLUCOSE 266*  < >  162* 380* 246* 254* 303*  BUN 18  < > 10 8 9 8 8   CREATININE 0.70  < > 0.51 0.69 0.58 0.56 0.53  CALCIUM 8.9  < > 8.8* 8.7* 8.6* 8.5* 8.6*  MG 1.7  --   --   --   --   --   --   PHOS 3.3  --   --   --   --   --   --   < > = values in this interval not displayed. Liver Function Tests:  Recent Labs Lab 06/23/16 0534  AST 22  ALT 11*  ALKPHOS 92  BILITOT 1.2  PROT 7.8  ALBUMIN 4.5    Recent Labs Lab 06/23/16 0534  LIPASE 14   No results for input(s): AMMONIA in the last 168 hours. CBC:  Recent Labs Lab 06/24/16 0532 06/25/16 0313 06/26/16 0314 06/27/16 0325 06/28/16 0629  WBC 6.8 4.1 4.8 4.9 5.4  HGB 11.5* 10.9* 11.1* 10.5* 10.6*  HCT 34.9* 32.9* 33.0* 31.3* 31.1*  MCV 88.1 87.3 86.6 86.2 84.5  PLT 394 334 329 299 308   Cardiac Enzymes: No results for input(s): CKTOTAL, CKMB, CKMBINDEX, TROPONINI in the last 168 hours. BNP: Invalid input(s): POCBNP CBG:  Recent Labs Lab 06/27/16 0807 06/27/16 1300 06/27/16 1721 06/27/16 2045 06/28/16 0742  GLUCAP 237* 134* 183* 259* 276*   D-Dimer No results for input(s): DDIMER in the last 72 hours. Hgb A1c No results for input(s): HGBA1C in the last 72 hours. Lipid Profile No results for input(s): CHOL, HDL, LDLCALC, TRIG, CHOLHDL, LDLDIRECT in the last 72 hours. Thyroid function studies No results for input(s): TSH, T4TOTAL, T3FREE, THYROIDAB in the last 72 hours.  Invalid input(s): FREET3 Anemia work up No results for input(s): VITAMINB12, FOLATE, FERRITIN, TIBC, IRON, RETICCTPCT in the last 72 hours. Urinalysis    Component Value Date/Time   COLORURINE YELLOW 06/23/2016 0636   APPEARANCEUR HAZY (A) 06/23/2016 0636   LABSPEC  1.021 06/23/2016 0636   PHURINE 5.0 06/23/2016 0636   GLUCOSEU >=500 (A) 06/23/2016 0636   GLUCOSEU >=1000 11/07/2012 1205   HGBUR NEGATIVE 06/23/2016 0636   BILIRUBINUR NEGATIVE 06/23/2016 0636   KETONESUR 80 (A) 06/23/2016 0636   PROTEINUR NEGATIVE 06/23/2016 0636   UROBILINOGEN 0.2 11/24/2014 1045   NITRITE NEGATIVE 06/23/2016 0636   LEUKOCYTESUR MODERATE (A) 06/23/2016 0636   Sepsis Labs Invalid input(s): PROCALCITONIN,  WBC,  LACTICIDVEN Microbiology Recent Results (from the past 240 hour(s))  Culture, Urine     Status: Abnormal   Collection Time: 06/23/16  6:36 AM  Result Value Ref Range Status   Specimen Description URINE, RANDOM  Final   Special Requests NONE  Final   Culture MULTIPLE SPECIES PRESENT, SUGGEST RECOLLECTION (A)  Final   Report Status 06/24/2016 FINAL  Final  MRSA PCR Screening     Status: None   Collection Time: 06/23/16  8:40 AM  Result Value Ref Range Status   MRSA by PCR NEGATIVE NEGATIVE Final    Comment:        The GeneXpert MRSA Assay (FDA approved for NASAL specimens only), is one component of a comprehensive MRSA colonization surveillance program. It is not intended to diagnose MRSA infection nor to guide or monitor treatment for MRSA infections.      Time coordinating discharge: Over 30 minutes  SIGNED:   Hosie Poisson, MD  [facility] 06/28/2016, 9:18 AM Pager   If 7PM-7AM, please contact night-coverage [email or web address] Password [email or web address]

## 2016-07-05 ENCOUNTER — Inpatient Hospital Stay (HOSPITAL_COMMUNITY)
Admission: EM | Admit: 2016-07-05 | Discharge: 2016-07-11 | DRG: 074 | Disposition: A | Discharge status: Home / Self Care | Payer: Medicaid Other | Attending: Internal Medicine | Admitting: Internal Medicine

## 2016-07-05 ENCOUNTER — Encounter (HOSPITAL_COMMUNITY): Payer: Self-pay | Admitting: Emergency Medicine

## 2016-07-05 DIAGNOSIS — R7309 Other abnormal glucose: Secondary | ICD-10-CM | POA: Diagnosis not present

## 2016-07-05 DIAGNOSIS — F1290 Cannabis use, unspecified, uncomplicated: Secondary | ICD-10-CM | POA: Diagnosis present

## 2016-07-05 DIAGNOSIS — J4540 Moderate persistent asthma, uncomplicated: Secondary | ICD-10-CM | POA: Diagnosis present

## 2016-07-05 DIAGNOSIS — R739 Hyperglycemia, unspecified: Secondary | ICD-10-CM | POA: Diagnosis not present

## 2016-07-05 DIAGNOSIS — K760 Fatty (change of) liver, not elsewhere classified: Secondary | ICD-10-CM | POA: Diagnosis present

## 2016-07-05 DIAGNOSIS — E860 Dehydration: Secondary | ICD-10-CM | POA: Diagnosis present

## 2016-07-05 DIAGNOSIS — R1110 Vomiting, unspecified: Secondary | ICD-10-CM | POA: Diagnosis not present

## 2016-07-05 DIAGNOSIS — E1010 Type 1 diabetes mellitus with ketoacidosis without coma: Secondary | ICD-10-CM | POA: Diagnosis present

## 2016-07-05 DIAGNOSIS — R112 Nausea with vomiting, unspecified: Secondary | ICD-10-CM | POA: Diagnosis present

## 2016-07-05 DIAGNOSIS — K219 Gastro-esophageal reflux disease without esophagitis: Secondary | ICD-10-CM | POA: Diagnosis present

## 2016-07-05 DIAGNOSIS — IMO0002 Reserved for concepts with insufficient information to code with codable children: Secondary | ICD-10-CM | POA: Diagnosis present

## 2016-07-05 DIAGNOSIS — Z794 Long term (current) use of insulin: Secondary | ICD-10-CM

## 2016-07-05 DIAGNOSIS — Z8249 Family history of ischemic heart disease and other diseases of the circulatory system: Secondary | ICD-10-CM

## 2016-07-05 DIAGNOSIS — K3184 Gastroparesis: Secondary | ICD-10-CM | POA: Diagnosis present

## 2016-07-05 DIAGNOSIS — Z9049 Acquired absence of other specified parts of digestive tract: Secondary | ICD-10-CM

## 2016-07-05 DIAGNOSIS — R011 Cardiac murmur, unspecified: Secondary | ICD-10-CM | POA: Diagnosis present

## 2016-07-05 DIAGNOSIS — Z91010 Allergy to peanuts: Secondary | ICD-10-CM

## 2016-07-05 DIAGNOSIS — G43A1 Cyclical vomiting, intractable: Secondary | ICD-10-CM

## 2016-07-05 DIAGNOSIS — K30 Functional dyspepsia: Secondary | ICD-10-CM | POA: Diagnosis present

## 2016-07-05 DIAGNOSIS — J45909 Unspecified asthma, uncomplicated: Secondary | ICD-10-CM | POA: Diagnosis present

## 2016-07-05 DIAGNOSIS — I498 Other specified cardiac arrhythmias: Secondary | ICD-10-CM | POA: Diagnosis not present

## 2016-07-05 DIAGNOSIS — R000 Tachycardia, unspecified: Secondary | ICD-10-CM | POA: Diagnosis not present

## 2016-07-05 DIAGNOSIS — R109 Unspecified abdominal pain: Secondary | ICD-10-CM | POA: Diagnosis present

## 2016-07-05 DIAGNOSIS — F418 Other specified anxiety disorders: Secondary | ICD-10-CM | POA: Diagnosis present

## 2016-07-05 DIAGNOSIS — R197 Diarrhea, unspecified: Secondary | ICD-10-CM | POA: Diagnosis not present

## 2016-07-05 DIAGNOSIS — E10649 Type 1 diabetes mellitus with hypoglycemia without coma: Secondary | ICD-10-CM | POA: Diagnosis not present

## 2016-07-05 DIAGNOSIS — D75839 Thrombocytosis, unspecified: Secondary | ICD-10-CM

## 2016-07-05 DIAGNOSIS — E1043 Type 1 diabetes mellitus with diabetic autonomic (poly)neuropathy: Principal | ICD-10-CM | POA: Diagnosis present

## 2016-07-05 DIAGNOSIS — E876 Hypokalemia: Secondary | ICD-10-CM | POA: Diagnosis present

## 2016-07-05 DIAGNOSIS — Z79899 Other long term (current) drug therapy: Secondary | ICD-10-CM

## 2016-07-05 DIAGNOSIS — I10 Essential (primary) hypertension: Secondary | ICD-10-CM | POA: Diagnosis present

## 2016-07-05 DIAGNOSIS — Z833 Family history of diabetes mellitus: Secondary | ICD-10-CM

## 2016-07-05 DIAGNOSIS — F419 Anxiety disorder, unspecified: Secondary | ICD-10-CM | POA: Diagnosis present

## 2016-07-05 DIAGNOSIS — E1065 Type 1 diabetes mellitus with hyperglycemia: Secondary | ICD-10-CM | POA: Diagnosis present

## 2016-07-05 DIAGNOSIS — Z7951 Long term (current) use of inhaled steroids: Secondary | ICD-10-CM

## 2016-07-05 DIAGNOSIS — R160 Hepatomegaly, not elsewhere classified: Secondary | ICD-10-CM | POA: Diagnosis present

## 2016-07-05 DIAGNOSIS — Z885 Allergy status to narcotic agent status: Secondary | ICD-10-CM

## 2016-07-05 DIAGNOSIS — D473 Essential (hemorrhagic) thrombocythemia: Secondary | ICD-10-CM | POA: Diagnosis present

## 2016-07-05 DIAGNOSIS — Z9119 Patient's noncompliance with other medical treatment and regimen: Secondary | ICD-10-CM

## 2016-07-05 LAB — BASIC METABOLIC PANEL
ANION GAP: 18 — AB (ref 5–15)
ANION GAP: 13 (ref 5–15)
BUN: 13 mg/dL (ref 6–20)
BUN: 14 mg/dL (ref 6–20)
CALCIUM: 9.2 mg/dL (ref 8.9–10.3)
CHLORIDE: 113 mmol/L — AB (ref 101–111)
CO2: 16 mmol/L — AB (ref 22–32)
CO2: 13 mmol/L — ABNORMAL LOW (ref 22–32)
CREATININE: 0.65 mg/dL (ref 0.44–1.00)
Calcium: 9 mg/dL (ref 8.9–10.3)
Chloride: 107 mmol/L (ref 101–111)
Creatinine, Ser: 0.8 mg/dL (ref 0.44–1.00)
GFR calc non Af Amer: >60 mL/min (ref >60)
GFR calc non Af Amer: >60 mL/min (ref >60)
Glucose, Bld: 325 mg/dL — ABNORMAL HIGH (ref 65–99)
Glucose, Bld: 178 mg/dL — ABNORMAL HIGH (ref 65–99)
POTASSIUM: 4.6 mmol/L (ref 3.5–5.1)
Potassium: 4.7 mmol/L (ref 3.5–5.1)
SODIUM: 141 mmol/L (ref 135–145)
SODIUM: 139 mmol/L (ref 135–145)

## 2016-07-05 LAB — URINALYSIS, MICROSCOPIC (REFLEX)
BACTERIA UA: NONE SEEN
RBC / HPF: NONE SEEN RBC/hpf (ref 0–5)

## 2016-07-05 LAB — COMPREHENSIVE METABOLIC PANEL
ALK PHOS: 96 U/L (ref 38–126)
ALT: 11 U/L — ABNORMAL LOW (ref 14–54)
ANION GAP: 15 (ref 5–15)
AST: 16 U/L (ref 15–41)
Albumin: 4.4 g/dL (ref 3.5–5.0)
BILIRUBIN TOTAL: 0.7 mg/dL (ref 0.3–1.2)
BUN: 15 mg/dL (ref 6–20)
CALCIUM: 9.5 mg/dL (ref 8.9–10.3)
CO2: 23 mmol/L (ref 22–32)
CREATININE: 0.68 mg/dL (ref 0.44–1.00)
Chloride: 103 mmol/L (ref 101–111)
Glucose, Bld: 232 mg/dL — ABNORMAL HIGH (ref 65–99)
Potassium: 3.6 mmol/L (ref 3.5–5.1)
SODIUM: 141 mmol/L (ref 135–145)
TOTAL PROTEIN: 7.9 g/dL (ref 6.5–8.1)

## 2016-07-05 LAB — URINALYSIS, ROUTINE W REFLEX MICROSCOPIC
Bilirubin Urine: NEGATIVE
Glucose, UA: >=500 mg/dL — AB
Ketones, ur: >80 mg/dL — AB
NITRITE: NEGATIVE
SPECIFIC GRAVITY, URINE: 1.015 (ref 1.005–1.030)
pH: 6 (ref 5.0–8.0)

## 2016-07-05 LAB — CBC
HCT: 41.3 % (ref 36.0–46.0)
HEMOGLOBIN: 13.4 g/dL (ref 12.0–15.0)
MCH: 29.1 pg (ref 26.0–34.0)
MCHC: 32.4 g/dL (ref 30.0–36.0)
MCV: 89.8 fL (ref 78.0–100.0)
PLATELETS: 546 10*3/uL — AB (ref 150–400)
RBC: 4.6 MIL/uL (ref 3.87–5.11)
RDW: 14.3 % (ref 11.5–15.5)
WBC: 15.2 10*3/uL — AB (ref 4.0–10.5)

## 2016-07-05 LAB — CBG MONITORING, ED
GLUCOSE-CAPILLARY: 281 mg/dL — AB (ref 65–99)
Glucose-Capillary: 193 mg/dL — ABNORMAL HIGH (ref 65–99)

## 2016-07-05 LAB — BLOOD GAS, ARTERIAL
Acid-base deficit: 6.9 mmol/L — ABNORMAL HIGH (ref 0.0–2.0)
BICARBONATE: 18.2 mmol/L — AB (ref 20.0–28.0)
Drawn by: 441261
FIO2: 21
O2 SAT: 95.4 %
PATIENT TEMPERATURE: 98.6
PCO2 ART: 37.1 mmHg (ref 32.0–48.0)
pH, Arterial: 7.311 — ABNORMAL LOW (ref 7.350–7.450)
pO2, Arterial: 87.2 mmHg (ref 83.0–108.0)

## 2016-07-05 LAB — POC URINE PREG, ED: Preg Test, Ur: NEGATIVE

## 2016-07-05 LAB — GLUCOSE, CAPILLARY
GLUCOSE-CAPILLARY: 163 mg/dL — AB (ref 65–99)
GLUCOSE-CAPILLARY: 210 mg/dL — AB (ref 65–99)
GLUCOSE-CAPILLARY: 348 mg/dL — AB (ref 65–99)
Glucose-Capillary: 218 mg/dL — ABNORMAL HIGH (ref 65–99)
Glucose-Capillary: 187 mg/dL — ABNORMAL HIGH (ref 65–99)

## 2016-07-05 LAB — PREGNANCY, URINE: PREG TEST UR: NEGATIVE

## 2016-07-05 LAB — LIPASE, BLOOD: Lipase: 17 U/L (ref 11–51)

## 2016-07-05 MED ORDER — SODIUM CHLORIDE 0.9 % IV BOLUS (SEPSIS)
1000.0000 mL | Freq: Once | INTRAVENOUS | Status: AC
  Administered 2016-07-05: 1000 mL via INTRAVENOUS

## 2016-07-05 MED ORDER — SODIUM CHLORIDE 0.9 % IV BOLUS (SEPSIS)
500.0000 mL | Freq: Once | INTRAVENOUS | Status: AC
  Administered 2016-07-05: 500 mL via INTRAVENOUS

## 2016-07-05 MED ORDER — INSULIN GLARGINE 100 UNIT/ML ~~LOC~~ SOLN
18.0000 [IU] | Freq: Every day | SUBCUTANEOUS | Status: DC
  Filled 2016-07-05: qty 0.18

## 2016-07-05 MED ORDER — SODIUM CHLORIDE 0.9 % IV SOLN
INTRAVENOUS | Status: DC
  Administered 2016-07-05: 18:00:00 via INTRAVENOUS

## 2016-07-05 MED ORDER — METOPROLOL TARTRATE 25 MG PO TABS
12.5000 mg | ORAL_TABLET | Freq: Two times a day (BID) | ORAL | Status: DC
  Administered 2016-07-05 – 2016-07-11 (×12): 12.5 mg via ORAL
  Filled 2016-07-05 (×12): qty 1

## 2016-07-05 MED ORDER — ACETAMINOPHEN 325 MG PO TABS
650.0000 mg | ORAL_TABLET | Freq: Four times a day (QID) | ORAL | Status: DC | PRN
  Administered 2016-07-05 – 2016-07-07 (×2): 650 mg via ORAL
  Filled 2016-07-05 (×2): qty 2

## 2016-07-05 MED ORDER — SODIUM CHLORIDE 0.9 % IV SOLN: INTRAVENOUS | Status: DC

## 2016-07-05 MED ORDER — DEXTROSE-NACL 5-0.45 % IV SOLN
INTRAVENOUS | Status: DC
  Administered 2016-07-05 – 2016-07-06 (×2): via INTRAVENOUS

## 2016-07-05 MED ORDER — PROMETHAZINE HCL 25 MG/ML IJ SOLN
25.0000 mg | Freq: Once | INTRAMUSCULAR | Status: AC
  Administered 2016-07-05: 25 mg via INTRAVENOUS
  Filled 2016-07-05: qty 1

## 2016-07-05 MED ORDER — ENOXAPARIN SODIUM 40 MG/0.4ML ~~LOC~~ SOLN
40.0000 mg | SUBCUTANEOUS | Status: DC
  Administered 2016-07-05 – 2016-07-10 (×6): 40 mg via SUBCUTANEOUS
  Filled 2016-07-05 (×6): qty 0.4

## 2016-07-05 MED ORDER — SODIUM CHLORIDE 0.9% FLUSH
3.0000 mL | Freq: Two times a day (BID) | INTRAVENOUS | Status: DC
  Administered 2016-07-05 – 2016-07-11 (×8): 3 mL via INTRAVENOUS

## 2016-07-05 MED ORDER — MORPHINE SULFATE (PF) 2 MG/ML IV SOLN
1.0000 mg | INTRAVENOUS | Status: DC | PRN
  Administered 2016-07-05 – 2016-07-06 (×3): 2 mg via INTRAVENOUS
  Filled 2016-07-05 (×3): qty 1

## 2016-07-05 MED ORDER — METOCLOPRAMIDE HCL 5 MG/ML IJ SOLN
10.0000 mg | Freq: Once | INTRAMUSCULAR | Status: AC
  Administered 2016-07-05: 10 mg via INTRAVENOUS
  Filled 2016-07-05: qty 2

## 2016-07-05 MED ORDER — MOMETASONE FURO-FORMOTEROL FUM 200-5 MCG/ACT IN AERO
2.0000 | INHALATION_SPRAY | Freq: Two times a day (BID) | RESPIRATORY_TRACT | Status: DC
  Administered 2016-07-11: 2 via RESPIRATORY_TRACT
  Filled 2016-07-05: qty 8.8

## 2016-07-05 MED ORDER — ONDANSETRON HCL 4 MG/2ML IJ SOLN
4.0000 mg | Freq: Four times a day (QID) | INTRAMUSCULAR | Status: DC | PRN
  Administered 2016-07-05 – 2016-07-08 (×4): 4 mg via INTRAVENOUS
  Filled 2016-07-05 (×4): qty 2

## 2016-07-05 MED ORDER — SODIUM CHLORIDE 0.9 % IV SOLN
INTRAVENOUS | Status: DC
  Administered 2016-07-05: 2.6 [IU]/h via INTRAVENOUS
  Filled 2016-07-05: qty 1

## 2016-07-05 MED ORDER — ONDANSETRON HCL 4 MG/2ML IJ SOLN
4.0000 mg | Freq: Once | INTRAMUSCULAR | Status: AC
  Administered 2016-07-05: 4 mg via INTRAVENOUS
  Filled 2016-07-05: qty 2

## 2016-07-05 MED ORDER — PANTOPRAZOLE SODIUM 40 MG IV SOLR
40.0000 mg | INTRAVENOUS | Status: DC
  Administered 2016-07-05 – 2016-07-09 (×5): 40 mg via INTRAVENOUS
  Filled 2016-07-05 (×5): qty 40

## 2016-07-05 MED ORDER — MORPHINE SULFATE (PF) 4 MG/ML IV SOLN
1.0000 mg | INTRAVENOUS | Status: DC | PRN
  Administered 2016-07-05: 2 mg via INTRAVENOUS
  Filled 2016-07-05: qty 1

## 2016-07-05 MED ORDER — INSULIN ASPART 100 UNIT/ML ~~LOC~~ SOLN
0.0000 [IU] | Freq: Three times a day (TID) | SUBCUTANEOUS | Status: DC
  Administered 2016-07-05: 11 [IU] via SUBCUTANEOUS

## 2016-07-05 MED ORDER — INSULIN ASPART 100 UNIT/ML ~~LOC~~ SOLN: 0.0000 [IU] | Freq: Every day | SUBCUTANEOUS | Status: DC

## 2016-07-05 MED ORDER — MORPHINE SULFATE (PF) 4 MG/ML IV SOLN
4.0000 mg | Freq: Once | INTRAVENOUS | Status: AC
  Administered 2016-07-05: 4 mg via INTRAVENOUS
  Filled 2016-07-05: qty 1

## 2016-07-05 MED ORDER — METOCLOPRAMIDE HCL 5 MG/ML IJ SOLN
10.0000 mg | Freq: Four times a day (QID) | INTRAMUSCULAR | Status: DC
  Administered 2016-07-05 – 2016-07-10 (×19): 10 mg via INTRAVENOUS
  Filled 2016-07-05 (×18): qty 2

## 2016-07-05 MED ORDER — POTASSIUM CHLORIDE 10 MEQ/100ML IV SOLN
10.0000 meq | INTRAVENOUS | Status: AC
  Administered 2016-07-05 (×2): 10 meq via INTRAVENOUS
  Filled 2016-07-05 (×2): qty 100

## 2016-07-05 MED ORDER — ONDANSETRON 4 MG PO TBDP: 4.0000 mg | ORAL_TABLET | Freq: Three times a day (TID) | ORAL | 0 refills | Status: DC | PRN

## 2016-07-05 MED ORDER — OXYCODONE HCL 5 MG PO TABS
5.0000 mg | ORAL_TABLET | ORAL | Status: DC | PRN
  Administered 2016-07-06 – 2016-07-10 (×8): 5 mg via ORAL
  Filled 2016-07-05 (×11): qty 1

## 2016-07-05 MED ORDER — INSULIN ASPART 100 UNIT/ML ~~LOC~~ SOLN
6.0000 [IU] | Freq: Once | SUBCUTANEOUS | Status: AC
  Administered 2016-07-05: 6 [IU] via SUBCUTANEOUS
  Filled 2016-07-05: qty 1

## 2016-07-05 MED ORDER — POTASSIUM CHLORIDE ER 10 MEQ PO TBCR: 10.0000 meq | EXTENDED_RELEASE_TABLET | Freq: Every day | ORAL | 0 refills | Status: DC

## 2016-07-05 MED ORDER — LEVALBUTEROL HCL 0.63 MG/3ML IN NEBU: 0.6300 mg | INHALATION_SOLUTION | Freq: Four times a day (QID) | RESPIRATORY_TRACT | Status: DC | PRN

## 2016-07-05 MED ORDER — ONDANSETRON HCL 4 MG PO TABS: 4.0000 mg | ORAL_TABLET | Freq: Four times a day (QID) | ORAL | Status: DC | PRN

## 2016-07-05 MED ORDER — ACETAMINOPHEN 650 MG RE SUPP: 650.0000 mg | Freq: Four times a day (QID) | RECTAL | Status: DC | PRN

## 2016-07-05 MED ORDER — CEPHALEXIN 500 MG PO CAPS: 500.0000 mg | ORAL_CAPSULE | Freq: Two times a day (BID) | ORAL | 0 refills | Status: DC

## 2016-07-05 NOTE — ED Provider Notes (Signed)
[facility] DEPT Provider Note   CSN: [id number] Arrival date & time: 07/05/16  7824     History   Chief Complaint Chief Complaint  Patient presents with  . Abdominal Pain    HPI [name] is a 26 y.o. female.  The history is provided by the patient and medical records.  Abdominal Pain   Associated symptoms include nausea and vomiting.    26 year old female with poorly controlled type 1 diabetes, anxiety, arthritis, asthma, gastroparesis, fatty liver, hypertension, history of pancreatitis, presenting to the ED with abdominal pain, nausea, and vomiting.  States this is been ongoing for about an hour. States she has pain "all over" her body. So she has had some abdominal cramping. She denies any diarrhea. No fever or chills. States she has no idea what her blood sugar is as she has not checked it in a few days. She denies any urinary symptoms or pelvic pain. She states she took a tablet of Reglan this morning but has not had any change in her symptoms. Patient has had multiple prior admissions for DKA in the past.  Past Medical History:  Diagnosis Date  . Anxiety   . Arthritis   . Asthma   . Diabetes mellitus 2007   IDDM.  poorly controlled, multiple admits with DKA  . Gallstones   . Gastroparesis   . Heart murmur   . Hepatic steatosis 11/26/2014   and hepatomegaly  . Hypertension    NOT CURRENTLY ON ANY BP MED  . Liver mass 11/26/2014  . Pancreatitis, acute 11/26/2014    Patient Active Problem List   Diagnosis Date Noted  . Candiduria, asymptomatic 06/23/2016  . Hyponatremia 06/13/2016  . Hematuria 05/13/2016  . UTI (urinary tract infection) 01/22/2016  . Diarrhea 11/09/2015  . Acute urinary retention   . GERD (gastroesophageal reflux disease) 07/10/2015  . Depression with anxiety 07/10/2015  . Gastroparesis 06/22/2015  . Altered mental status 06/22/2015  . Volume depletion 06/10/2015  . Protein-calorie malnutrition, severe 06/10/2015  . Hyperglycemia     . Elevated bilirubin   . Hematemesis with nausea   . Intractable nausea and vomiting 05/28/2015  . Abdominal pain in female   . Hypertension 05/24/2015  . Dehydration   . Chronic diastolic heart failure ([place]) 04/11/2015  . Hematemesis 04/08/2015  . DKA (diabetic ketoacidoses) ([place]) 03/22/2015  . S/P laparoscopic cholecystectomy 02/11/2015  . Postextubation stridor   . Pancreatitis, acute 11/26/2014  . Volume overload 11/26/2014  . Hypokalemia 11/26/2014  . Hepatic steatosis 11/26/2014  . Liver mass 11/26/2014  . Sepsis ([place]) 11/25/2014  . Sinus tachycardia 11/25/2014  . Hypomagnesemia 11/25/2014  . Hypophosphatemia 11/25/2014  . Elevated LFTs 11/24/2014  . Acute kidney injury ([place]) 11/24/2014  . Migraine headache 11/24/2014  . Asthma 06/29/2012  . Uncontrolled type 1 diabetes mellitus ([place]) 06/19/2010  . Goiter, unspecified 06/19/2010    Past Surgical History:  Procedure Laterality Date  . CHOLECYSTECTOMY N/A 02/11/2015   Procedure: LAPAROSCOPIC CHOLECYSTECTOMY WITH INTRAOPERATIVE CHOLANGIOGRAM;  Surgeon: Greer Pickerel, MD;  Location: [facility] ORS;  Service: General;  Laterality: N/A;  . ESOPHAGOGASTRODUODENOSCOPY (EGD) WITH PROPOFOL Left 09/20/2014   Procedure: ESOPHAGOGASTRODUODENOSCOPY (EGD) WITH PROPOFOL;  Surgeon: Arta Silence, MD;  Location: [facility] ENDOSCOPY;  Service: Endoscopy;  Laterality: Left;  . WISDOM TOOTH EXTRACTION      OB History    Gravida Para Term Preterm AB Living   2 1 0 1 1 1    SAB TAB Ectopic Multiple Live Births   0 1 0 0  1       Home Medications    Prior to Admission medications   Medication Sig Start Date End Date Taking? Authorizing Provider  albuterol (PROVENTIL HFA;VENTOLIN HFA) 108 (90 BASE) MCG/ACT inhaler Inhale 2 puffs into the lungs every 6 (six) hours as needed for wheezing or shortness of breath.     [provider]  albuterol (PROVENTIL) (2.5 MG/3ML) 0.083% nebulizer solution Take 2.5 mg by nebulization every 6 (six) hours as  needed for wheezing or shortness of breath.     [provider]  budesonide-formoterol (SYMBICORT) 160-4.5 MCG/ACT inhaler Inhale 2 puffs into the lungs 2 (two) times daily.     [provider]  famotidine (PEPCID) 20 MG tablet Take 1 tablet (20 mg total) by mouth at bedtime. Patient not taking: Reported on 06/23/2016 02/29/16   Antonietta Breach, PA-C  glucagon (GLUCAGON EMERGENCY) 1 MG injection Inject 1 mg into the vein once as needed. 06/14/16   Sheikh, Georgina Quint Latif, DO  insulin aspart (NOVOLOG FLEXPEN) 100 UNIT/ML FlexPen Inject into the skin See admin instructions. Pt uses 1 unit for every 10 grams of carbs with meals and snacks for BS greater than 50.    [provider]  insulin glargine (LANTUS) 100 UNIT/ML injection Inject 0.22 mLs (22 Units total) into the skin daily. 06/28/16   Hosie Poisson, MD  metoCLOPramide (REGLAN) 10 MG tablet Take 1 tablet (10 mg total) by mouth 3 (three) times daily before meals. 06/28/16   Hosie Poisson, MD  metoprolol tartrate (LOPRESSOR) 25 MG tablet Take 0.5 tablets (12.5 mg total) by mouth 2 (two) times daily. 05/10/16   Mariel Aloe, MD  pantoprazole (PROTONIX) 40 MG tablet Take 1 tablet (40 mg total) by mouth 2 (two) times daily. 02/25/16   Regalado, Jerald Kief A, MD  promethazine (PHENERGAN) 25 MG tablet Take 1 tablet (25 mg total) by mouth every 6 (six) hours as needed for nausea or vomiting. 06/15/16   Deno Etienne, DO  Vitamin D, Ergocalciferol, (DRISDOL) 50000 units CAPS capsule Take 50,000 Units by mouth once a week. 05/26/16   [provider]    Family History Family History  Problem Relation Age of Onset  . Heart disease Maternal Grandmother   . Heart disease Maternal Grandfather   . Diabetes Mother   . Hyperlipidemia Mother   . Hypertension Father   . Heart disease Father   . Hypertension Paternal Grandmother   . Cancer Paternal Grandfather     Social History Social History  Substance Use Topics  . Smoking status: Never  Smoker  . Smokeless tobacco: Never Used  . Alcohol use No     Allergies   Peanut-containing drug products; Food; and Ultram [tramadol]   Review of Systems Review of Systems  Gastrointestinal: Positive for abdominal pain, nausea and vomiting.  All other systems reviewed and are negative.    Physical Exam Updated Vital Signs BP (!) 159/108 (BP Location: Left Arm)   Pulse (!) 125   Temp 98 F (36.7 C) (Oral)   Resp (!) 22   LMP 06/08/2016 Comment: negative preg test on 06/23/16   SpO2 100%   Physical Exam  Constitutional: She is oriented to person, place, and time. She appears well-developed and well-nourished.  Writhing around in bed, cannot sit still  HENT:  Head: Normocephalic and atraumatic.  Mouth/Throat: Oropharynx is clear and moist.  Eyes: Conjunctivae and EOM are normal. Pupils are equal, round, and reactive to light.  Neck: Normal range of motion.  Cardiovascular: Normal rate, regular rhythm and normal heart sounds.   Pulmonary/Chest: Effort normal and breath sounds normal. No respiratory distress. She has no wheezes.  Abdominal: Soft. Bowel sounds are normal. There is no tenderness. There is no rebound.  Musculoskeletal: Normal range of motion.  Neurological: She is alert and oriented to person, place, and time.  Skin: Skin is warm and dry.  Psychiatric: She has a normal mood and affect.  Nursing note and vitals reviewed.    ED Treatments / Results  Labs (all labs ordered are listed, but only abnormal results are displayed) Labs Reviewed  COMPREHENSIVE METABOLIC PANEL - Abnormal; Notable for the following:       Result Value   Glucose, Bld 232 (*)    ALT 11 (*)    All other components within normal limits  CBC - Abnormal; Notable for the following:    WBC 15.2 (*)    Platelets 546 (*)    All other components within normal limits  URINALYSIS, ROUTINE W REFLEX MICROSCOPIC - Abnormal; Notable for the following:    APPearance CLOUDY (*)    Glucose, UA  >=500 (*)    Hgb urine dipstick TRACE (*)    Ketones, ur >80 (*)    Protein, ur TRACE (*)    Leukocytes, UA SMALL (*)    All other components within normal limits  BLOOD GAS, ARTERIAL - Abnormal; Notable for the following:    pH, Arterial 7.311 (*)    Bicarbonate 18.2 (*)    Acid-base deficit 6.9 (*)    All other components within normal limits  URINALYSIS, MICROSCOPIC (REFLEX) - Abnormal; Notable for the following:    Squamous Epithelial / LPF 0-5 (*)    All other components within normal limits  CBG MONITORING, ED - Abnormal; Notable for the following:    Glucose-Capillary 281 (*)    All other components within normal limits  CBG MONITORING, ED - Abnormal; Notable for the following:    Glucose-Capillary 193 (*)    All other components within normal limits  LIPASE, BLOOD  PREGNANCY, URINE  POC URINE PREG, ED    EKG  EKG Interpretation None       Radiology No results found.  Procedures Procedures (including critical care time)  Medications Ordered in ED Medications  sodium chloride 0.9 % bolus 1,000 mL (0 mLs Intravenous Stopped 07/05/16 1227)  ondansetron (ZOFRAN) injection 4 mg (4 mg Intravenous Given 07/05/16 1003)  morphine 4 MG/ML injection 4 mg (4 mg Intravenous Given 07/05/16 1044)  metoCLOPramide (REGLAN) injection 10 mg (10 mg Intravenous Given 07/05/16 1044)  sodium chloride 0.9 % bolus 1,000 mL (0 mLs Intravenous Stopped 07/05/16 1446)  insulin aspart (novoLOG) injection 6 Units (6 Units Subcutaneous Given 07/05/16 1218)  promethazine (PHENERGAN) injection 25 mg (25 mg Intravenous Given 07/05/16 1221)  ondansetron (ZOFRAN) injection 4 mg (4 mg Intravenous Given 07/05/16 1513)     Initial Impression / Assessment and Plan / ED Course  I have reviewed the triage vital signs and the nursing notes.  Pertinent labs & imaging results that were available during my care of the patient were reviewed by me and considered in my medical decision making (see chart for  details).  26 year old female here with hyperglycemia, nausea, and vomiting.  Patient is a type I diabetic, history of noncompliance.  She is tachycardic here and appears clinically dry. She was started on IV fluids and given anti-medics. Labwork is overall reassuring. Her blood gas is mildly abnormal with slightly  low pH and bicarbonate.  Patient was given small dose of insulin, second liter of fluid. He remains tachycardic in the 120s and continues to have vomiting after 3 rounds of medication. Patient started on third liter and given additional meds. At this point, she likely will need admission for continued IV fluids and anti-emetics.  Discussed with hospitalist, they will admit for ongoing care.  Final Clinical Impressions(s) / ED Diagnoses   Final diagnoses:  Hyperglycemia  Nausea and vomiting, intractability of vomiting not specified, unspecified vomiting type    New Prescriptions New Prescriptions   No medications on file     Kathryne Hitch 07/05/16 1522    Tanna Furry, MD 07/06/16 [phone]

## 2016-07-05 NOTE — H&P (Addendum)
[facility] History and Physical  Marg Macmaster FUX:[id number] DOB: [birth year] DOA: 07/05/2016   PCP: Vicenta Aly, FNP  Specialists: None  Chief Complaint: Nausea, vomiting and abdominal pain  HPI: [name] is a 26 y.o. female with a past medical history of type 1 diabetes, diabetic gastroparesis, anxiety, asthma, who was discharged recently on June 10 after being managed for DKA and gastroparesis. Patient has had frequent hospitalization and has had numerous ER visits for her symptoms. She tells me that she has been compliant with her medications. She started having symptoms of nausea, vomiting, abdominal pain about 2 hours prior to arrival to the emergency department. She has been vomiting bilious fluid. Denies any blood in the emesis. Abdominal pain is all over. She denies any discomfort with urination or blood in the urine. Denies any diarrhea. Takes her insulin at nighttime and took her dose was yesterday night. Despite treatment in the emergency department patient has not improved and so will need hospitalization.  Home Medications: Prior to Admission medications   Medication Sig Start Date End Date Taking? Authorizing Provider  albuterol (PROVENTIL HFA;VENTOLIN HFA) 108 (90 BASE) MCG/ACT inhaler Inhale 2 puffs into the lungs every 6 (six) hours as needed for wheezing or shortness of breath.    Yes [provider]  albuterol (PROVENTIL) (2.5 MG/3ML) 0.083% nebulizer solution Take 2.5 mg by nebulization every 6 (six) hours as needed for wheezing or shortness of breath.    Yes [provider]  budesonide-formoterol (SYMBICORT) 160-4.5 MCG/ACT inhaler Inhale 2 puffs into the lungs 2 (two) times daily.    Yes [provider]  glucagon (GLUCAGON EMERGENCY) 1 MG injection Inject 1 mg into the vein once as needed. 06/14/16  Yes Sheikh, Omair Latif, DO  insulin aspart (NOVOLOG FLEXPEN) 100 UNIT/ML FlexPen Inject into the skin See admin instructions.  Pt uses 1 unit for every 10 grams of carbs with meals and snacks for BS greater than 50.   Yes [provider]  insulin glargine (LANTUS) 100 UNIT/ML injection Inject 0.22 mLs (22 Units total) into the skin daily. 06/28/16  Yes Hosie Poisson, MD  metoCLOPramide (REGLAN) 10 MG tablet Take 1 tablet (10 mg total) by mouth 3 (three) times daily before meals. 06/28/16  Yes Hosie Poisson, MD  metoprolol tartrate (LOPRESSOR) 25 MG tablet Take 0.5 tablets (12.5 mg total) by mouth 2 (two) times daily. 05/10/16  Yes Mariel Aloe, MD  pantoprazole (PROTONIX) 40 MG tablet Take 1 tablet (40 mg total) by mouth 2 (two) times daily. 02/25/16  Yes Regalado, Belkys A, MD  promethazine (PHENERGAN) 25 MG tablet Take 1 tablet (25 mg total) by mouth every 6 (six) hours as needed for nausea or vomiting. 06/15/16  Yes Deno Etienne, DO  Vitamin D, Ergocalciferol, (DRISDOL) 50000 units CAPS capsule Take 50,000 Units by mouth once a week. 05/26/16  Yes [provider]  famotidine (PEPCID) 20 MG tablet Take 1 tablet (20 mg total) by mouth at bedtime. Patient not taking: Reported on 06/23/2016 02/29/16   Antonietta Breach, PA-C    Allergies:  Allergies  Allergen Reactions  . Peanut-Containing Drug Products Swelling and Other (See Comments)    Reaction:  Swelling of mouth and lips   . 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   Reaction:  Swelling of mouth and lips   . Ultram [Tramadol] Itching    Past Medical History: Past Medical History:  Diagnosis Date  . Anxiety   .  Arthritis   . Asthma   . Diabetes mellitus 2007   IDDM.  poorly controlled, multiple admits with DKA  . Gallstones   . Gastroparesis   . Heart murmur   . Hepatic steatosis 11/26/2014   and hepatomegaly  . Hypertension    NOT CURRENTLY ON ANY BP MED  . Liver mass 11/26/2014  . Pancreatitis, acute 11/26/2014    Past Surgical History:  Procedure Laterality Date  . CHOLECYSTECTOMY N/A 02/11/2015   Procedure:  LAPAROSCOPIC CHOLECYSTECTOMY WITH INTRAOPERATIVE CHOLANGIOGRAM;  Surgeon: Greer Pickerel, MD;  Location: [facility] ORS;  Service: General;  Laterality: N/A;  . ESOPHAGOGASTRODUODENOSCOPY (EGD) WITH PROPOFOL Left 09/20/2014   Procedure: ESOPHAGOGASTRODUODENOSCOPY (EGD) WITH PROPOFOL;  Surgeon: Arta Silence, MD;  Location: [facility] ENDOSCOPY;  Service: Endoscopy;  Laterality: Left;  . WISDOM TOOTH EXTRACTION      Social History: Does smoke marijuana. Denies any nicotine use. No alcohol use. No illicit drug use.   Family History:  Family History  Problem Relation Age of Onset  . Heart disease Maternal Grandmother   . Heart disease Maternal Grandfather   . Diabetes Mother   . Hyperlipidemia Mother   . Hypertension Father   . Heart disease Father   . Hypertension Paternal Grandmother   . Cancer Paternal Grandfather      Review of Systems - History obtained from the patient General ROS: positive for  - fatigue Psychological ROS: negative Ophthalmic ROS: negative ENT ROS: negative Allergy and Immunology ROS: negative Hematological and Lymphatic ROS: negative Endocrine ROS: negative Respiratory ROS: no cough, shortness of breath, or wheezing Cardiovascular ROS: no chest pain or dyspnea on exertion Gastrointestinal ROS: as in hpi Genito-Urinary ROS: no dysuria, trouble voiding, or hematuria Musculoskeletal ROS: negative Neurological ROS: no TIA or stroke symptoms Dermatological ROS: negative  Physical Examination  Vitals:   07/05/16 0926 07/05/16 1227 07/05/16 1446  BP: (!) 159/108 (!) 142/86 (!) 154/99  Pulse: (!) 125 (!) 128 (!) 126  Resp: (!) 22 18 16   Temp: 98 F (36.7 C)    TempSrc: Oral    SpO2: 100% 99% 100%    BP (!) 154/99 (BP Location: Left Arm)   Pulse (!) 126   Temp 98 F (36.7 C) (Oral)   Resp 16   LMP 06/08/2016 Comment: negative preg test on 06/23/16   SpO2 100%   General appearance: alert, cooperative, appears stated age and no distress Head: Normocephalic, without  obvious abnormality, atraumatic Eyes: conjunctivae/corneas clear. PERRL, EOM's intact.  Throat: Dry mucous membranes Neck: no adenopathy, no carotid bruit, no JVD, supple, symmetrical, trachea midline and thyroid not enlarged, symmetric, no tenderness/mass/nodules Resp: clear to auscultation bilaterally Cardio: regular rate and rhythm, S1, S2 normal, no murmur, click, rub or gallop GI: Abdomen is soft. Mildly tender in the epigastric area without any rebound, rigidity or guarding. No masses or organomegaly. Extremities: extremities normal, atraumatic, no cyanosis or edema Pulses: 2+ and symmetric Skin: Skin color, texture, turgor normal. No rashes or lesions Lymph nodes: Cervical, supraclavicular, and axillary nodes normal. Neurologic: Awake and alert. Oriented 3. No focal neurological deficits.   Labs on Admission: I have personally reviewed following labs and imaging studies  CBC:  Recent Labs Lab 07/05/16 1000  WBC 15.2*  HGB 13.4  HCT 41.3  MCV 89.8  PLT 157*   Basic Metabolic Panel:  Recent Labs Lab 07/05/16 1000  NA 141  K 3.6  CL 103  CO2 23  GLUCOSE 232*  BUN 15  CREATININE 0.68  CALCIUM 9.5  GFR: Estimated Creatinine Clearance: 88.7 mL/min (by C-G formula based on SCr of 0.68 mg/dL). Liver Function Tests:  Recent Labs Lab 07/05/16 1000  AST 16  ALT 11*  ALKPHOS 96  BILITOT 0.7  PROT 7.9  ALBUMIN 4.4    Recent Labs Lab 07/05/16 1000  LIPASE 17   CBG:  Recent Labs Lab 07/05/16 1011 07/05/16 1442  GLUCAP 281* 193*     Problem List  Principal Problem:   Intractable nausea and vomiting Active Problems:   Uncontrolled type 1 diabetes mellitus (HCC)   Sinus tachycardia   Abdominal pain   Gastroparesis   Assessment: This is a 26 year old African-American female with a past medical history as stated earlier, who presents with nausea, vomiting, abdominal pain. She appears to be experiencing exacerbation of her diabetic gastroparesis.  Bicarbonate is normal.   Plan: #1 Nausea, vomiting and abdominal pain/diabetic gastroparesis: Patient has had multiple hospitalizations for the same. Her abdomen is benign to examination. She's had multiple CT scans of her abdomen in the past. WBC is noted to be elevated and will be repeated tomorrow morning. She's afebrile. No evidence for any infection at this time. She'll be given Reglan intravenously. She'll be given IV fluids. Pain control.  #2 Type 1 diabetes mellitus: Bicarbonate is normal. Anion gap is 15. She was given IV fluids. Repeat basic metabolic panel is pending. If there is no significant difference, then she can just be treated with her Lantus. No clear evidence for DKA at this time. If, however, she does have a rise in her anion gap, then we may have to utilize intravenous insulin.  #3 Sinus tachycardia: This has been noted previously as well. Continue to monitor on telemetry. Continue IV fluids. Heart rate should improve as she is adequately hydrated. TSH was normal in October.  #4 history of essential hypertension: Continue with her home medications.  #5 history of asthma: Continue with her inhalers.  ADDENDUM Repeat labs show worsening bicarb and climbing AG. Now she need IV insulin for DKA management. Will initiate order set. Will need to be transferred to stepdown. RN notified.  DVT Prophylaxis: Lovenox Code Status: Full code Family Communication: Discussed with the patient  Consults called: None   Severity of Illness: The appropriate patient status for this patient is INPATIENT. Inpatient status is judged to be reasonable and necessary in order to provide the required intensity of service to ensure the patient's safety. The patient's presenting symptoms, physical exam findings, and initial radiographic and laboratory data in the context of their chronic comorbidities is felt to place them at high risk for further clinical deterioration. Furthermore, it is not  anticipated that the patient will be medically stable for discharge from the hospital within 2 midnights of admission. The following factors support the patient status of inpatient.   " The patient's presenting symptoms include nausea and vomiting. " The worrisome physical exam findings include mild abdominal tenderness. " The initial radiographic and laboratory data are worrisome because of hyperglycemia. " The chronic co-morbidities include type 1 diabetes.   * I certify that at the point of admission it is my clinical judgment that the patient will require inpatient hospital care spanning beyond 2 midnights from the point of admission due to high intensity of service, high risk for further deterioration and high frequency of surveillance required.*  Further management decisions will depend on results of further testing and patient's response to treatment.   [facility] Pager [phone]  If 7PM-7AM, please contact  night-coverage [email or web address] Password [facility]  07/05/2016, 5:04 PM

## 2016-07-05 NOTE — ED Triage Notes (Signed)
Pt c/o generalized abdominal pain, emesis, and body pain x 1 hour, consistent with her usual gastroparesis related pain.

## 2016-07-06 DIAGNOSIS — R1013 Epigastric pain: Secondary | ICD-10-CM | POA: Diagnosis not present

## 2016-07-06 DIAGNOSIS — F419 Anxiety disorder, unspecified: Secondary | ICD-10-CM | POA: Diagnosis present

## 2016-07-06 DIAGNOSIS — Z79899 Other long term (current) drug therapy: Secondary | ICD-10-CM | POA: Diagnosis not present

## 2016-07-06 DIAGNOSIS — Z794 Long term (current) use of insulin: Secondary | ICD-10-CM | POA: Diagnosis not present

## 2016-07-06 DIAGNOSIS — E1010 Type 1 diabetes mellitus with ketoacidosis without coma: Secondary | ICD-10-CM | POA: Diagnosis not present

## 2016-07-06 DIAGNOSIS — K219 Gastro-esophageal reflux disease without esophagitis: Secondary | ICD-10-CM | POA: Diagnosis present

## 2016-07-06 DIAGNOSIS — K760 Fatty (change of) liver, not elsewhere classified: Secondary | ICD-10-CM | POA: Diagnosis present

## 2016-07-06 DIAGNOSIS — I10 Essential (primary) hypertension: Secondary | ICD-10-CM | POA: Diagnosis present

## 2016-07-06 DIAGNOSIS — R011 Cardiac murmur, unspecified: Secondary | ICD-10-CM | POA: Diagnosis present

## 2016-07-06 DIAGNOSIS — R112 Nausea with vomiting, unspecified: Secondary | ICD-10-CM | POA: Diagnosis not present

## 2016-07-06 DIAGNOSIS — R197 Diarrhea, unspecified: Secondary | ICD-10-CM | POA: Diagnosis not present

## 2016-07-06 DIAGNOSIS — Z9049 Acquired absence of other specified parts of digestive tract: Secondary | ICD-10-CM | POA: Diagnosis not present

## 2016-07-06 DIAGNOSIS — F418 Other specified anxiety disorders: Secondary | ICD-10-CM | POA: Diagnosis present

## 2016-07-06 DIAGNOSIS — R160 Hepatomegaly, not elsewhere classified: Secondary | ICD-10-CM | POA: Diagnosis present

## 2016-07-06 DIAGNOSIS — R739 Hyperglycemia, unspecified: Secondary | ICD-10-CM | POA: Diagnosis not present

## 2016-07-06 DIAGNOSIS — Z7951 Long term (current) use of inhaled steroids: Secondary | ICD-10-CM | POA: Diagnosis not present

## 2016-07-06 DIAGNOSIS — K30 Functional dyspepsia: Secondary | ICD-10-CM | POA: Diagnosis present

## 2016-07-06 DIAGNOSIS — E860 Dehydration: Secondary | ICD-10-CM | POA: Diagnosis present

## 2016-07-06 DIAGNOSIS — R000 Tachycardia, unspecified: Secondary | ICD-10-CM | POA: Diagnosis not present

## 2016-07-06 DIAGNOSIS — E1043 Type 1 diabetes mellitus with diabetic autonomic (poly)neuropathy: Secondary | ICD-10-CM | POA: Diagnosis not present

## 2016-07-06 DIAGNOSIS — E876 Hypokalemia: Secondary | ICD-10-CM | POA: Diagnosis not present

## 2016-07-06 DIAGNOSIS — J4540 Moderate persistent asthma, uncomplicated: Secondary | ICD-10-CM | POA: Diagnosis present

## 2016-07-06 DIAGNOSIS — D473 Essential (hemorrhagic) thrombocythemia: Secondary | ICD-10-CM | POA: Diagnosis present

## 2016-07-06 DIAGNOSIS — E10649 Type 1 diabetes mellitus with hypoglycemia without coma: Secondary | ICD-10-CM | POA: Diagnosis not present

## 2016-07-06 DIAGNOSIS — F1290 Cannabis use, unspecified, uncomplicated: Secondary | ICD-10-CM | POA: Diagnosis present

## 2016-07-06 DIAGNOSIS — J4520 Mild intermittent asthma, uncomplicated: Secondary | ICD-10-CM | POA: Diagnosis not present

## 2016-07-06 DIAGNOSIS — K3184 Gastroparesis: Secondary | ICD-10-CM | POA: Diagnosis not present

## 2016-07-06 LAB — CBC
HCT: 33.3 % — ABNORMAL LOW (ref 36.0–46.0)
HEMOGLOBIN: 10.8 g/dL — AB (ref 12.0–15.0)
MCH: 29.3 pg (ref 26.0–34.0)
MCHC: 32.4 g/dL (ref 30.0–36.0)
MCV: 90.2 fL (ref 78.0–100.0)
Platelets: 478 10*3/uL — ABNORMAL HIGH (ref 150–400)
RBC: 3.69 MIL/uL — ABNORMAL LOW (ref 3.87–5.11)
RDW: 14.6 % (ref 11.5–15.5)
WBC: 25 10*3/uL — ABNORMAL HIGH (ref 4.0–10.5)

## 2016-07-06 LAB — GLUCOSE, CAPILLARY
GLUCOSE-CAPILLARY: 156 mg/dL — AB (ref 65–99)
GLUCOSE-CAPILLARY: 135 mg/dL — AB (ref 65–99)
GLUCOSE-CAPILLARY: 156 mg/dL — AB (ref 65–99)
GLUCOSE-CAPILLARY: 212 mg/dL — AB (ref 65–99)
Glucose-Capillary: 135 mg/dL — ABNORMAL HIGH (ref 65–99)
Glucose-Capillary: 137 mg/dL — ABNORMAL HIGH (ref 65–99)
Glucose-Capillary: 148 mg/dL — ABNORMAL HIGH (ref 65–99)
Glucose-Capillary: 149 mg/dL — ABNORMAL HIGH (ref 65–99)
Glucose-Capillary: 157 mg/dL — ABNORMAL HIGH (ref 65–99)
Glucose-Capillary: 157 mg/dL — ABNORMAL HIGH (ref 65–99)
Glucose-Capillary: 172 mg/dL — ABNORMAL HIGH (ref 65–99)
Glucose-Capillary: 178 mg/dL — ABNORMAL HIGH (ref 65–99)
Glucose-Capillary: 179 mg/dL — ABNORMAL HIGH (ref 65–99)
Glucose-Capillary: 183 mg/dL — ABNORMAL HIGH (ref 65–99)
Glucose-Capillary: 191 mg/dL — ABNORMAL HIGH (ref 65–99)
Glucose-Capillary: 195 mg/dL — ABNORMAL HIGH (ref 65–99)
Glucose-Capillary: 315 mg/dL — ABNORMAL HIGH (ref 65–99)

## 2016-07-06 LAB — BASIC METABOLIC PANEL
Anion gap: 6 (ref 5–15)
Anion gap: 7 (ref 5–15)
Anion gap: 5 (ref 5–15)
BUN: 14 mg/dL (ref 6–20)
BUN: 13 mg/dL (ref 6–20)
BUN: 14 mg/dL (ref 6–20)
CALCIUM: 9.1 mg/dL (ref 8.9–10.3)
CALCIUM: 8.9 mg/dL (ref 8.9–10.3)
CALCIUM: 9 mg/dL (ref 8.9–10.3)
CO2: 19 mmol/L — ABNORMAL LOW (ref 22–32)
CO2: 18 mmol/L — ABNORMAL LOW (ref 22–32)
CO2: 21 mmol/L — ABNORMAL LOW (ref 22–32)
CREATININE: 0.7 mg/dL (ref 0.44–1.00)
Chloride: 115 mmol/L — ABNORMAL HIGH (ref 101–111)
Chloride: 113 mmol/L — ABNORMAL HIGH (ref 101–111)
Chloride: 112 mmol/L — ABNORMAL HIGH (ref 101–111)
Creatinine, Ser: 0.61 mg/dL (ref 0.44–1.00)
Creatinine, Ser: 0.56 mg/dL (ref 0.44–1.00)
GFR calc Af Amer: >60 mL/min (ref >60)
GFR calc Af Amer: >60 mL/min (ref >60)
GFR calc Af Amer: >60 mL/min (ref >60)
GLUCOSE: 142 mg/dL — AB (ref 65–99)
GLUCOSE: 167 mg/dL — AB (ref 65–99)
GLUCOSE: 180 mg/dL — AB (ref 65–99)
Potassium: 4 mmol/L (ref 3.5–5.1)
Potassium: 4.7 mmol/L (ref 3.5–5.1)
Potassium: 4.6 mmol/L (ref 3.5–5.1)
Sodium: 140 mmol/L (ref 135–145)
Sodium: 138 mmol/L (ref 135–145)
Sodium: 138 mmol/L (ref 135–145)

## 2016-07-06 MED ORDER — ORAL CARE MOUTH RINSE
15.0000 mL | Freq: Two times a day (BID) | OROMUCOSAL | Status: DC
  Administered 2016-07-06 – 2016-07-11 (×9): 15 mL via OROMUCOSAL

## 2016-07-06 MED ORDER — INSULIN GLARGINE 100 UNIT/ML ~~LOC~~ SOLN
12.0000 [IU] | Freq: Two times a day (BID) | SUBCUTANEOUS | Status: DC
  Administered 2016-07-06 – 2016-07-07 (×3): 12 [IU] via SUBCUTANEOUS
  Filled 2016-07-06 (×4): qty 0.12

## 2016-07-06 MED ORDER — INSULIN ASPART 100 UNIT/ML ~~LOC~~ SOLN
0.0000 [IU] | SUBCUTANEOUS | Status: DC
  Administered 2016-07-06 (×2): 3 [IU] via SUBCUTANEOUS
  Administered 2016-07-06: 5 [IU] via SUBCUTANEOUS
  Administered 2016-07-06 – 2016-07-07 (×4): 3 [IU] via SUBCUTANEOUS
  Administered 2016-07-07: 5 [IU] via SUBCUTANEOUS
  Administered 2016-07-07: 3 [IU] via SUBCUTANEOUS
  Administered 2016-07-08: 2 [IU] via SUBCUTANEOUS
  Administered 2016-07-08: 3 [IU] via SUBCUTANEOUS
  Administered 2016-07-08: 2 [IU] via SUBCUTANEOUS
  Administered 2016-07-08: 5 [IU] via SUBCUTANEOUS
  Administered 2016-07-08: 3 [IU] via SUBCUTANEOUS
  Administered 2016-07-09: 11 [IU] via SUBCUTANEOUS

## 2016-07-06 NOTE — Progress Notes (Addendum)
PROGRESS NOTE [facility]   [name]   URK:[id number] DOB: [birth year]  DOA: 07/05/2016 PCP: Stefanie Aly, FNP   Brief Narrative: [name] is a 26 y/o F with Type 1 DM, diabetes gastroparesis with recurrent admissions, anxiety and asthma. Patient was discharged on 06/28/2016 after being managed for DKA in the end gastroparesis. She reported yesterday started having abdominal pain associated with nausea and vomiting. Upon arrival to the ED she was treated with IV fluids, nausea medication and insulin but did not improve prompting hospitalization due to exacerbation of diabetes gastroparesis. On the day of admission patient is a carb was noted to be worsening and anion gap increasing since she was started on DKA management.  Subjective: Patient seen and examined, report feeling much better. Denies nausea and vomiting since yesterday. He has nothing to eat since she is in insulin drip. Denies chest pain, shortness of breath, cough, dysuria, hematuria and diarrhea.  Assessment & Plan: Diabetes gastroparesis - improving Patient has multiple hospitalization for the same, abdomen benign exam, WBC elevated but she is afebrile and there is no signs of infection this time. We'll continue Reglan every 6 hour, Zofran when necessary. If worsen will consider erythromycin. Strict glucose control, gastroparesis diet.  Diabetes ketoacidosis Patient treated with insulin drip for glucose localize will transition to subcutaneous insulin Advance diet as tolerated SSI and monitor CBGs  Sinus Tachycardia Likely secondary to dehydration this has been happening in the past improvement with proper hydration. TSH was normal in October. Telemetry with no changes.  Essential hypertension Blood pressure stable Continue current regimen.  Moderate persistent asthma No wheezing, stable Continue current inhalers  DVT prophylaxis: Lovenox Code Status: Full code Family Communication: None  at bedside Disposition Plan: Home when medically stable  Consultants:   None  Procedures:   None  Antimicrobials: Anti-infectives    Start     Dose/Rate Route Frequency Ordered Stop   07/05/16 0000  cephALEXin (KEFLEX) 500 MG capsule  Status:  Discontinued     500 mg Oral 2 times daily 07/05/16 1136 07/05/16        Objective: Vitals:   07/06/16 0000 07/06/16 0400 07/06/16 0600 07/06/16 0800  BP: (!) 94/46 (!) 104/59 (!) 150/106 112/62  Pulse: (!) 111 98 (!) 110 (!) 102  Resp: (!) 24 13 12 12   Temp: 99 F (37.2 C) 98.9 F (37.2 C)  98 F (36.7 C)  TempSrc: Oral Oral  Oral  SpO2: 98% 97% 99% 100%  Weight:      Height:        Intake/Output Summary (Last 24 hours) at 07/06/16 0902 Last data filed at 07/06/16 0600  Gross per 24 hour  Intake          3634.19 ml  Output              800 ml  Net          2834.19 ml   Filed Weights   07/05/16 1702  Weight: 52.2 kg (115 lb)    Examination:  General exam: Appears calm and comfortable  Respiratory system: Clear to auscultation. No wheezes,crackle or rhonchi Cardiovascular system: S1 & S2 heard, RRR. No JVD, murmurs, rubs or gallops Gastrointestinal system: Abdomen is nondistended, soft and nontender.  Central nervous system: Alert and oriented. No focal neurological deficits. Extremities: No pedal edema. Symmetric Skin: No rashes, lesions or ulcers Psychiatry: Judgement and insight appear normal. Mood & affect appropriate.    Data Reviewed: I have personally reviewed following  labs and imaging studies  CBC:  Recent Labs Lab 07/05/16 1000 07/06/16 0203  WBC 15.2* 25.0*  HGB 13.4 10.8*  HCT 41.3 33.3*  MCV 89.8 90.2  PLT 546* 272*   Basic Metabolic Panel:  Recent Labs Lab 07/05/16 1000 07/05/16 1657 07/05/16 2202 07/06/16 0203 07/06/16 0603  NA 141 141 139 140 138  K 3.6 4.7 4.6 4.0 4.7  CL 103 107 113* 115* 113*  CO2 23 16* 13* 19* 18*  GLUCOSE 232* 325* 178* 142* 167*  BUN 15 13 14 14 13     CREATININE 0.68 0.65 0.80 0.70 0.61  CALCIUM 9.5 9.2 9.0 9.1 8.9   GFR: Estimated Creatinine Clearance: 87.8 mL/min (by C-G formula based on SCr of 0.61 mg/dL). Liver Function Tests:  Recent Labs Lab 07/05/16 1000  AST 16  ALT 11*  ALKPHOS 96  BILITOT 0.7  PROT 7.9  ALBUMIN 4.4    Recent Labs Lab 07/05/16 1000  LIPASE 17   No results for input(s): AMMONIA in the last 168 hours. Coagulation Profile: No results for input(s): INR, PROTIME in the last 168 hours. Cardiac Enzymes: No results for input(s): CKTOTAL, CKMB, CKMBINDEX, TROPONINI in the last 168 hours. BNP (last 3 results) No results for input(s): PROBNP in the last 8760 hours. HbA1C: No results for input(s): HGBA1C in the last 72 hours. CBG:  Recent Labs Lab 07/06/16 0453 07/06/16 0553 07/06/16 0653 07/06/16 0758 07/06/16 0816  GLUCAP 137* 157* 156* 191* 179*   Lipid Profile: No results for input(s): CHOL, HDL, LDLCALC, TRIG, CHOLHDL, LDLDIRECT in the last 72 hours. Thyroid Function Tests: No results for input(s): TSH, T4TOTAL, FREET4, T3FREE, THYROIDAB in the last 72 hours. Anemia Panel: No results for input(s): VITAMINB12, FOLATE, FERRITIN, TIBC, IRON, RETICCTPCT in the last 72 hours. Sepsis Labs: No results for input(s): PROCALCITON, LATICACIDVEN in the last 168 hours.  No results found for this or any previous visit (from the past 240 hour(s)).     Radiology Studies: No results found.    Scheduled Meds: . enoxaparin (LOVENOX) injection  40 mg Subcutaneous Q24H  . mouth rinse  15 mL Mouth Rinse BID  . metoCLOPramide (REGLAN) injection  10 mg Intravenous Q6H  . metoprolol tartrate  12.5 mg Oral BID  . mometasone-formoterol  2 puff Inhalation BID  . pantoprazole (PROTONIX) IV  40 mg Intravenous Q24H  . sodium chloride flush  3 mL Intravenous Q12H   Continuous Infusions: . sodium chloride Stopped (07/05/16 2048)  . dextrose 5 % and 0.45% NaCl 125 mL/hr at 07/05/16 2046  . insulin  (NOVOLIN-R) infusion 2.6 Units/hr (07/06/16 0803)     LOS: 0 days    Time spent: Total of 25 minutes spent with pt, greater than 50% of which was spent in discussion of  treatment, counseling and coordination of care    Chipper Oman, MD Pager: Text Page via [email or web address]  [phone]  If 7PM-7AM, please contact night-coverage [email or web address] Password [email or web address] 07/06/2016, 9:02 AM

## 2016-07-06 NOTE — Care Management Note (Signed)
Case Management Note  Patient Details  Name: [name] MRN: [id number] Date of Birth: Oct 25, 1990  Subjective/Objective:                  26 y.o. female with a past medical history of type 1 diabetes, diabetic gastroparesis, anxiety, asthma, who was discharged recently on June 10 after being managed for DKA and gastroparesis. Patient has had frequent hospitalization and has had numerous ER visits for her symptoms. She tells me that she has been compliant with her medications. She started having symptoms of nausea, vomiting, abdominal pain about 2 hours prior to arrival to the emergency department. She has been vomiting bilious fluid. Denies any blood in the emesis. Abdominal pain is all over. She denies any discomfort with urination or blood in the urine. Denies any diarrhea. Takes her insulin at nighttime and took her dose was yesterday night. Despite treatment in the emergency department patient has not improved and so will need hospitalization.  Action/Plan: Date:  July 06, 2016  Chart reviewed for concurrent status and case management needs.  Will continue to follow patient progress.  Discharge Planning: following for needs  Expected discharge date: [id number]  Velva Harman, BSN, [place], [place]   Expected Discharge Date:                  Expected Discharge Plan:  Home/Self Care  In-House Referral:     Discharge planning Services  CM Consult  Post Acute Care Choice:    Choice offered to:     DME Arranged:    DME Agency:     HH Arranged:    HH Agency:     Status of Service:  In process, will continue to follow  If discussed at Long Length of Stay Meetings, dates discussed:    Additional Comments:  Leeroy Cha, RN 07/06/2016, 9:31 AM

## 2016-07-07 LAB — CBC WITH DIFFERENTIAL/PLATELET
BASOS ABS: 0.1 10*3/uL (ref 0.0–0.1)
BASOS PCT: 1 %
Eosinophils Absolute: 0.3 10*3/uL (ref 0.0–0.7)
Eosinophils Relative: 4 %
HEMATOCRIT: 34.7 % — AB (ref 36.0–46.0)
Hemoglobin: 11.8 g/dL — ABNORMAL LOW (ref 12.0–15.0)
LYMPHS ABS: 1.9 10*3/uL (ref 0.7–4.0)
LYMPHS PCT: 24 %
MCH: 29.8 pg (ref 26.0–34.0)
MCHC: 34 g/dL (ref 30.0–36.0)
MCV: 87.6 fL (ref 78.0–100.0)
Monocytes Absolute: 0.5 10*3/uL (ref 0.1–1.0)
Monocytes Relative: 6 %
Neutro Abs: 5.1 10*3/uL (ref 1.7–7.7)
Neutrophils Relative %: 65 %
Platelets: 331 10*3/uL (ref 150–400)
RBC: 3.96 MIL/uL (ref 3.87–5.11)
RDW: 14.1 % (ref 11.5–15.5)
WBC: 7.9 10*3/uL (ref 4.0–10.5)

## 2016-07-07 LAB — BASIC METABOLIC PANEL
Anion gap: 8 (ref 5–15)
BUN: 9 mg/dL (ref 6–20)
CHLORIDE: 110 mmol/L (ref 101–111)
CO2: 20 mmol/L — ABNORMAL LOW (ref 22–32)
CREATININE: 0.45 mg/dL (ref 0.44–1.00)
Calcium: 8.6 mg/dL — ABNORMAL LOW (ref 8.9–10.3)
GFR calc Af Amer: >60 mL/min (ref >60)
GFR calc non Af Amer: >60 mL/min (ref >60)
Glucose, Bld: 170 mg/dL — ABNORMAL HIGH (ref 65–99)
POTASSIUM: 4.5 mmol/L (ref 3.5–5.1)
Sodium: 138 mmol/L (ref 135–145)

## 2016-07-07 LAB — GLUCOSE, CAPILLARY
GLUCOSE-CAPILLARY: 161 mg/dL — AB (ref 65–99)
GLUCOSE-CAPILLARY: 176 mg/dL — AB (ref 65–99)
GLUCOSE-CAPILLARY: 198 mg/dL — AB (ref 65–99)
Glucose-Capillary: 111 mg/dL — ABNORMAL HIGH (ref 65–99)
Glucose-Capillary: 179 mg/dL — ABNORMAL HIGH (ref 65–99)
Glucose-Capillary: 229 mg/dL — ABNORMAL HIGH (ref 65–99)

## 2016-07-07 LAB — URINE CULTURE

## 2016-07-07 MED ORDER — SODIUM CHLORIDE 0.9 % IV SOLN: 250.0000 mg | Freq: Three times a day (TID) | INTRAVENOUS | Status: DC

## 2016-07-07 MED ORDER — PROMETHAZINE HCL 25 MG/ML IJ SOLN
25.0000 mg | Freq: Four times a day (QID) | INTRAMUSCULAR | Status: DC | PRN
  Administered 2016-07-07 – 2016-07-08 (×5): 25 mg via INTRAVENOUS
  Filled 2016-07-07 (×5): qty 1

## 2016-07-07 MED ORDER — KETOROLAC TROMETHAMINE 15 MG/ML IJ SOLN
15.0000 mg | Freq: Three times a day (TID) | INTRAMUSCULAR | Status: AC | PRN
  Administered 2016-07-07 – 2016-07-08 (×4): 15 mg via INTRAVENOUS
  Filled 2016-07-07 (×4): qty 1

## 2016-07-07 MED ORDER — KETOROLAC TROMETHAMINE 30 MG/ML IJ SOLN
30.0000 mg | Freq: Once | INTRAMUSCULAR | Status: AC
  Administered 2016-07-07: 30 mg via INTRAVENOUS
  Filled 2016-07-07: qty 1

## 2016-07-07 MED ORDER — INSULIN GLARGINE 100 UNIT/ML ~~LOC~~ SOLN
15.0000 [IU] | Freq: Two times a day (BID) | SUBCUTANEOUS | Status: DC
  Administered 2016-07-07 – 2016-07-09 (×5): 15 [IU] via SUBCUTANEOUS
  Filled 2016-07-07 (×6): qty 0.15

## 2016-07-07 MED ORDER — DIPHENHYDRAMINE HCL 50 MG/ML IJ SOLN
25.0000 mg | Freq: Once | INTRAMUSCULAR | Status: AC
Start: 2016-07-07 — End: 2016-07-07
  Administered 2016-07-07: 25 mg via INTRAVENOUS
  Filled 2016-07-07: qty 1

## 2016-07-07 MED ORDER — MORPHINE SULFATE (PF) 4 MG/ML IV SOLN
2.0000 mg | Freq: Once | INTRAVENOUS | Status: AC
  Administered 2016-07-07: 2 mg via INTRAVENOUS
  Filled 2016-07-07: qty 1

## 2016-07-07 MED ORDER — ACETAMINOPHEN 10 MG/ML IV SOLN
1000.0000 mg | Freq: Four times a day (QID) | INTRAVENOUS | Status: AC | PRN
  Administered 2016-07-07 – 2016-07-08 (×2): 1000 mg via INTRAVENOUS
  Filled 2016-07-07 (×3): qty 100

## 2016-07-07 MED ORDER — MORPHINE SULFATE (PF) 4 MG/ML IV SOLN: 1.0000 mg | INTRAVENOUS | Status: DC | PRN

## 2016-07-07 MED ORDER — HYDRALAZINE HCL 20 MG/ML IJ SOLN
5.0000 mg | INTRAMUSCULAR | Status: DC | PRN
  Administered 2016-07-07: 5 mg via INTRAVENOUS
  Filled 2016-07-07: qty 0.25

## 2016-07-07 MED ORDER — ERYTHROMYCIN BASE 250 MG PO TABS
250.0000 mg | ORAL_TABLET | Freq: Three times a day (TID) | ORAL | Status: DC
  Administered 2016-07-07 – 2016-07-11 (×12): 250 mg via ORAL
  Filled 2016-07-07 (×13): qty 1

## 2016-07-07 NOTE — Progress Notes (Addendum)
PROGRESS NOTE [facility]   Marlea Gambill   RCV:[id number] DOB: 08-11-1990  DOA: 07/05/2016 PCP: Vicenta Aly, FNP   Brief Narrative: [name] is a 26 y/o F with Type 1 DM, diabetes gastroparesis with recurrent admissions, anxiety and asthma. Patient was discharged on 06/28/2016 after being managed for DKA in the end gastroparesis. She reported yesterday started having abdominal pain associated with nausea and vomiting. Upon arrival to the ED she was treated with IV fluids, nausea medication and insulin but did not improve prompting hospitalization due to exacerbation of diabetes gastroparesis. On the day of admission patient bicarb was noted to be worsening and anion gap increasing since she was started on DKA management.  Subjective: Patient seen and examined, she had a pretty good night but this morning started having abdominal pain with nausea and vomiting. She has vomited multiple occasions bile content.  Assessment & Plan: Diabetes gastroparesis - patient with multiple hospitalizations for the same She was improving but this morning symptoms has recur. Initially WBC were elevated but no signs of infection were detected this has resolved on its own. Patient is on scheduled Reglan every 6 hours, will add erythromycin, Phenergan and Benadryl. Continue strict glucose control. And place patient on nothing by mouth again. Consider Haldol if needed  Avoid narcotics as much as possible as this will slow down intestinal transit.   Diabetes ketoacidosis - ketoacidosis has resolved Anion gap close, bicarbonate of 20  Continue Lantus and SSI. Monitor CBG's Keep sugars below 200   Sinus Tachycardia Likely secondary to dehydration this has been happening in the past improvement with proper hydration. TSH was normal in October. Telemetry with no changes.  Essential hypertension Blood pressure slight above goal - likely related to pain  Monitor BP for now - pain control,  will add Toradol PRN  Continue metoprolol   Moderate persistent asthma Remains stable  Continue home inhalers   DVT prophylaxis: Lovenox Code Status: Full code Family Communication: None at bedside Disposition Plan: Home when medically stable  Consultants:   None  Procedures:   None  Antimicrobials: Anti-infectives    Start     Dose/Rate Route Frequency Ordered Stop   07/07/16 1700  erythromycin (E-MYCIN) tablet 250 mg     250 mg Oral 3 times daily with meals 07/07/16 1611     07/07/16 1515  erythromycin 250 mg in sodium chloride 0.9 % 100 mL IVPB  Status:  Discontinued     250 mg 100 mL/hr over 60 Minutes Intravenous Every 8 hours 07/07/16 1501 07/07/16 1610   07/05/16 0000  cephALEXin (KEFLEX) 500 MG capsule  Status:  Discontinued     500 mg Oral 2 times daily 07/05/16 1136 07/05/16       Objective: Vitals:   07/06/16 2350 07/07/16 0449 07/07/16 0951 07/07/16 1433  BP: 111/75 116/73 (!) 139/113 (!) 152/112  Pulse: 98 97 (!) 110 (!) 125  Resp: 16 16 16 18   Temp: 98.9 F (37.2 C) 98.3 F (36.8 C) 98.7 F (37.1 C) 98.7 F (37.1 C)  TempSrc: Oral Oral Oral Oral  SpO2: 99% 100% 100% 100%  Weight:      Height:        Intake/Output Summary (Last 24 hours) at 07/07/16 1625 Last data filed at 07/07/16 1230  Gross per 24 hour  Intake              720 ml  Output  800 ml  Net              -80 ml   Filed Weights   07/05/16 1702  Weight: 52.2 kg (115 lb)    Examination:  General: Mild distress due to pain  Cardiovascular: RRR, S1/S2 +, no rubs, no gallops Respiratory: CTA bilaterally, no wheezing, no rhonchi Abdominal: Soft, epigastric tenderness, no rebound or guarding, + BS  Extremities: no LE edema  Data Reviewed: I have personally reviewed following labs and imaging studies  CBC:  Recent Labs Lab 07/05/16 1000 07/06/16 0203 07/07/16 0705  WBC 15.2* 25.0* 7.9  NEUTROABS  --   --  5.1  HGB 13.4 10.8* 11.8*  HCT 41.3 33.3* 34.7*  MCV  89.8 90.2 87.6  PLT 546* 478* 659   Basic Metabolic Panel:  Recent Labs Lab 07/05/16 2202 07/06/16 0203 07/06/16 0603 07/06/16 1008 07/07/16 0705  NA 139 140 138 138 138  K 4.6 4.0 4.7 4.6 4.5  CL 113* 115* 113* 112* 110  CO2 13* 19* 18* 21* 20*  GLUCOSE 178* 142* 167* 180* 170*  BUN 14 14 13 14 9   CREATININE 0.80 0.70 0.61 0.56 0.45  CALCIUM 9.0 9.1 8.9 9.0 8.6*   GFR: Estimated Creatinine Clearance: 87.8 mL/min (by C-G formula based on SCr of 0.45 mg/dL). Liver Function Tests:  Recent Labs Lab 07/05/16 1000  AST 16  ALT 11*  ALKPHOS 96  BILITOT 0.7  PROT 7.9  ALBUMIN 4.4    Recent Labs Lab 07/05/16 1000  LIPASE 17   No results for input(s): AMMONIA in the last 168 hours. Coagulation Profile: No results for input(s): INR, PROTIME in the last 168 hours. Cardiac Enzymes: No results for input(s): CKTOTAL, CKMB, CKMBINDEX, TROPONINI in the last 168 hours. BNP (last 3 results) No results for input(s): PROBNP in the last 8760 hours. HbA1C: No results for input(s): HGBA1C in the last 72 hours. CBG:  Recent Labs Lab 07/06/16 2348 07/07/16 0444 07/07/16 0801 07/07/16 1244 07/07/16 1623  GLUCAP 172* 111* 198* 176* 179*   Lipid Profile: No results for input(s): CHOL, HDL, LDLCALC, TRIG, CHOLHDL, LDLDIRECT in the last 72 hours. Thyroid Function Tests: No results for input(s): TSH, T4TOTAL, FREET4, T3FREE, THYROIDAB in the last 72 hours. Anemia Panel: No results for input(s): VITAMINB12, FOLATE, FERRITIN, TIBC, IRON, RETICCTPCT in the last 72 hours. Sepsis Labs: No results for input(s): PROCALCITON, LATICACIDVEN in the last 168 hours.  Recent Results (from the past 240 hour(s))  Urine Culture     Status: Abnormal   Collection Time: 07/05/16 10:00 AM  Result Value Ref Range Status   Specimen Description URINE, CLEAN CATCH  Final   Special Requests NONE  Final   Culture MULTIPLE SPECIES PRESENT, SUGGEST RECOLLECTION (A)  Final   Report Status 07/07/2016  FINAL  Final       Radiology Studies: No results found.    Scheduled Meds: . enoxaparin (LOVENOX) injection  40 mg Subcutaneous Q24H  . erythromycin  250 mg Oral TID WC  . insulin aspart  0-15 Units Subcutaneous Q4H  . insulin glargine  12 Units Subcutaneous BID  . mouth rinse  15 mL Mouth Rinse BID  . metoCLOPramide (REGLAN) injection  10 mg Intravenous Q6H  . metoprolol tartrate  12.5 mg Oral BID  . mometasone-formoterol  2 puff Inhalation BID  . pantoprazole (PROTONIX) IV  40 mg Intravenous Q24H  . sodium chloride flush  3 mL Intravenous Q12H   Continuous Infusions:    LOS:  1 day    Time spent: Total of 25 minutes spent with pt, greater than 50% of which was spent in discussion of  treatment, counseling and coordination of care    Chipper Oman, MD Pager: Text Page via [email or web address]  [phone]  If 7PM-7AM, please contact night-coverage [email or web address] Password [facility] 07/07/2016, 4:25 PM

## 2016-07-07 NOTE — Progress Notes (Signed)
Pt restless trashing around in bed, stating pain is level 8 nmeed additional pain med. MD called pain med given as ordered.

## 2016-07-07 NOTE — Progress Notes (Signed)
Pt has been nauseated off and on throughout the the day, some relief after one dose of pain med, level 8 to level 2. Currently pain level in abdomen is lvel 9-10, MD called.

## 2016-07-08 ENCOUNTER — Encounter (HOSPITAL_COMMUNITY): Payer: Self-pay | Admitting: Gastroenterology

## 2016-07-08 DIAGNOSIS — J4520 Mild intermittent asthma, uncomplicated: Secondary | ICD-10-CM

## 2016-07-08 DIAGNOSIS — D75839 Thrombocytosis, unspecified: Secondary | ICD-10-CM

## 2016-07-08 DIAGNOSIS — K219 Gastro-esophageal reflux disease without esophagitis: Secondary | ICD-10-CM

## 2016-07-08 DIAGNOSIS — R000 Tachycardia, unspecified: Secondary | ICD-10-CM

## 2016-07-08 DIAGNOSIS — I10 Essential (primary) hypertension: Secondary | ICD-10-CM

## 2016-07-08 DIAGNOSIS — E1010 Type 1 diabetes mellitus with ketoacidosis without coma: Secondary | ICD-10-CM

## 2016-07-08 DIAGNOSIS — D473 Essential (hemorrhagic) thrombocythemia: Secondary | ICD-10-CM

## 2016-07-08 DIAGNOSIS — R739 Hyperglycemia, unspecified: Secondary | ICD-10-CM

## 2016-07-08 DIAGNOSIS — R112 Nausea with vomiting, unspecified: Secondary | ICD-10-CM

## 2016-07-08 DIAGNOSIS — K3184 Gastroparesis: Secondary | ICD-10-CM

## 2016-07-08 DIAGNOSIS — R1013 Epigastric pain: Secondary | ICD-10-CM

## 2016-07-08 DIAGNOSIS — E860 Dehydration: Secondary | ICD-10-CM

## 2016-07-08 LAB — BASIC METABOLIC PANEL
Anion gap: 12 (ref 5–15)
BUN: 11 mg/dL (ref 6–20)
CO2: 22 mmol/L (ref 22–32)
Calcium: 9.4 mg/dL (ref 8.9–10.3)
Chloride: 107 mmol/L (ref 101–111)
Creatinine, Ser: 0.58 mg/dL (ref 0.44–1.00)
GFR calc non Af Amer: >60 mL/min (ref >60)
GLUCOSE: 169 mg/dL — AB (ref 65–99)
POTASSIUM: 3.5 mmol/L (ref 3.5–5.1)
Sodium: 141 mmol/L (ref 135–145)

## 2016-07-08 LAB — CBC WITH DIFFERENTIAL/PLATELET
BASOS PCT: 0 %
Basophils Absolute: 0 10*3/uL (ref 0.0–0.1)
Eosinophils Absolute: 0.1 10*3/uL (ref 0.0–0.7)
Eosinophils Relative: 1 %
HEMATOCRIT: 39.5 % (ref 36.0–46.0)
Hemoglobin: 13.2 g/dL (ref 12.0–15.0)
Lymphocytes Relative: 20 %
Lymphs Abs: 1.7 10*3/uL (ref 0.7–4.0)
MCH: 29.5 pg (ref 26.0–34.0)
MCHC: 33.4 g/dL (ref 30.0–36.0)
MCV: 88.2 fL (ref 78.0–100.0)
MONO ABS: 0.8 10*3/uL (ref 0.1–1.0)
MONOS PCT: 9 %
NEUTROS ABS: 6.2 10*3/uL (ref 1.7–7.7)
Neutrophils Relative %: 70 %
Platelets: 463 10*3/uL — ABNORMAL HIGH (ref 150–400)
RBC: 4.48 MIL/uL (ref 3.87–5.11)
RDW: 14.1 % (ref 11.5–15.5)
WBC: 8.9 10*3/uL (ref 4.0–10.5)

## 2016-07-08 LAB — GLUCOSE, CAPILLARY
GLUCOSE-CAPILLARY: 110 mg/dL — AB (ref 65–99)
Glucose-Capillary: 148 mg/dL — ABNORMAL HIGH (ref 65–99)
Glucose-Capillary: 212 mg/dL — ABNORMAL HIGH (ref 65–99)
Glucose-Capillary: 182 mg/dL — ABNORMAL HIGH (ref 65–99)
Glucose-Capillary: 165 mg/dL — ABNORMAL HIGH (ref 65–99)
Glucose-Capillary: 124 mg/dL — ABNORMAL HIGH (ref 65–99)

## 2016-07-08 LAB — MAGNESIUM: Magnesium: 1.7 mg/dL (ref 1.7–2.4)

## 2016-07-08 LAB — PHOSPHORUS: Phosphorus: 4.2 mg/dL (ref 2.5–4.6)

## 2016-07-08 MED ORDER — MORPHINE SULFATE (PF) 4 MG/ML IV SOLN
1.0000 mg | Freq: Once | INTRAVENOUS | Status: AC
  Administered 2016-07-08: 1 mg via INTRAVENOUS
  Filled 2016-07-08: qty 1

## 2016-07-08 MED ORDER — SODIUM CHLORIDE 0.9 % IV SOLN
INTRAVENOUS | Status: DC
  Administered 2016-07-08 – 2016-07-11 (×6): via INTRAVENOUS

## 2016-07-08 MED ORDER — METOPROLOL TARTRATE 5 MG/5ML IV SOLN
5.0000 mg | Freq: Once | INTRAVENOUS | Status: AC
  Administered 2016-07-08: 5 mg via INTRAVENOUS
  Filled 2016-07-08: qty 5

## 2016-07-08 NOTE — Progress Notes (Signed)
Pt temp 99.8, BP 148/107, HR 140. MD notified. Will continue to monitor.

## 2016-07-08 NOTE — Progress Notes (Signed)
PROGRESS NOTE    [name]  JOA:[id number] DOB: 1990-05-01 DOA: 07/05/2016 PCP: Vicenta Aly, FNP  Brief Narrative:  [name] is a 26 y/o F with Type 1 DM, diabetes gastroparesis with recurrent admissions, anxiety and asthma. Patient was discharged on 06/28/2016 after being managed for DKA in the end gastroparesis. She reported yesterday started having abdominal pain associated with nausea and vomiting. Upon arrival to the ED she was treated with IV fluids, nausea medication and insulin but did not improve prompting hospitalization due to exacerbation of diabetes gastroparesis. On the day of admission patient bicarb was noted to be worsening and anion gap increasing since she was started on DKA management. She is now on Long Acting Insulin and made NPO as she was still having symptoms. Gastroenterology was consulted for further recommendations as patient is not improving significantly and is on IV Metoclopramide and po Erythromycin.   Assessment & Plan:   Principal Problem:   Intractable nausea and vomiting Active Problems:   Uncontrolled type 1 diabetes mellitus (HCC)   Asthma   Sinus tachycardia   Dehydration   Abdominal pain   Hypertension   Gastroparesis   GERD (gastroesophageal reflux disease)   Thrombocytosis (HCC)  Nausea/Vomiting/Abdominal Pain in the setting of Diabetic Gastroparesis -Now NPO as patient's symptoms worsened; Has had multiple admissions for the same  -C/w Zofran po/IV q6hprn and c/w Promethazine 25 mg IV q6hprn -C/w Metoclopramide 10 mg IV q6h and added Erythromycin 250 mg po TIDwm -C/w Pantoprazole 40 mg IV daily -C/w Strict Glucose Control -Avoid Narcotics as much as possible as it will slow down the Intestinal Tract; Was given IV Morphine 1 mg early this AM and has Oxycodone IR 5 mg q4hprn -Gastroenterology Consulted for further Recc's -Pain control with IV Acetaminophen 1000 mg q6hprn and with Ketolorlac 15 mg IV q8hprn for Moderate  Pain  -Lipase normal at 17.    Diabetic Ketoacidosis without coma associated with Type 1 diabetes mellitus, improved -Patient has had multiple admissions in the last 6 months -Bicarb is now 22; AG was 41 -Was started on IV Dilaudid for pain with 0.5 mg IV q4hprn for Severe Pain, will send out on po Ketorolac -C/w Lantus 15 unts sq BID and with Moderate Novolog SSI q4h as patient is NPO -CBG's ranging from 148-182 -Will try to advance Diet slowly -Keep sugars below 200   Sinus Tachycardia -Likely secondary to dehydration  -This has been happening in the past; improvement with proper hydration. -Restarted IVF Rehydration at 100 mL/hr -TSH was normal in October at 2.005; Will repeat iN AM -C/w Metoprolol 12.5 mg po BID -Telemetry with no changes; C/w Telemetry Monitoring   Essential Hypertension -Blood pressure was slight above goal - likely related to pain  -Continue Metoprolol 12.5 mg po BID -C/w Hydralazine 5 mg IV q4hprn for SBP >160 or DBP >100  Leukocytosis, improved -Was likely reactive to DKA -Improved and went from 15.2 -> 25.0 -> 7.9-> 8.9 -Repeat CBC in AMand continue to monitor for S/Sx of Infection  Moderate Persistent Asthma -Remains stable  -Not in Exacerbation -C/w Xopenex 0.63 mg Neb q6hprn, Dulera 200-5 mcg/ACT  Thrombocytosis  -Platelet Count went from 331 -> 463 -Likely Reactive -Continue to Monitor and Repeat CBC in AM  GERD -C/w Pantoprazole 40 mg IV q24h -Patient no longer taking po Famotidine   DVT prophylaxis: Enoxaparin 40 mg sq q24h Code Status: FULL CODE Family Communication: No family present at bedside Disposition Plan: Remain inpatient and D/C Home when medically  stable and improved  Consultants:   [facility]  Procedures:   None   Antimicrobials:  Anti-infectives    Start     Dose/Rate Route Frequency Ordered Stop   07/07/16 1700  erythromycin (E-MYCIN) tablet 250 mg     250 mg Oral 3 times daily with meals  07/07/16 1611     07/07/16 1515  erythromycin 250 mg in sodium chloride 0.9 % 100 mL IVPB  Status:  Discontinued     250 mg 100 mL/hr over 60 Minutes Intravenous Every 8 hours 07/07/16 1501 07/07/16 1610   07/05/16 0000  cephALEXin (KEFLEX) 500 MG capsule  Status:  Discontinued     500 mg Oral 2 times daily 07/05/16 1136 07/05/16      Subjective: Seen and examined and was having diffuse abdominal pain and stated she was nauseous and not feeling well. States she vomited yesterday. No Chest Pain or SOB.   Objective: Vitals:   07/07/16 1956 07/07/16 2340 07/08/16 0339 07/08/16 1005  BP: 133/80 114/74 (!) 140/99 (!) 166/105  Pulse: (!) 146 (!) 119 (!) 124 (!) 130  Resp: 18 18 18    Temp: 99.1 F (37.3 C) 100 F (37.8 C) 98.4 F (36.9 C)   TempSrc: Oral Oral Oral   SpO2: 100% 100% 100%   Weight:      Height:        Intake/Output Summary (Last 24 hours) at 07/08/16 1340 Last data filed at 07/08/16 0600  Gross per 24 hour  Intake              100 ml  Output              750 ml  Net             -650 ml   Filed Weights   07/05/16 1702  Weight: 52.2 kg (115 lb)   Examination: Physical Exam:  Constitutional: Thin AAF in Mild distress appears uncomfortable due to abdominal pain  Eyes: Lids and conjunctivae normal, sclerae anicteric  ENMT: External Ears, Nose appear normal. Grossly normal hearing. Mucous membranes are moist.  Neck: Appears normal, supple, no cervical masses, normal ROM, no appreciable thyromegaly, no visible JVD Respiratory: Clear to auscultation bilaterally, no wheezing, rales, rhonchi or crackles. Normal respiratory effort and patient is not tachypenic. No accessory muscle use.  Cardiovascular: Tachycardic rate but regular rhythm, no murmurs / rubs / gallops. S1 and S2 auscultated. No extremity edema.  Abdomen: Soft, tender to palpate, non-distended. No masses palpated. No appreciable hepatosplenomegaly. Bowel sounds positive.  GU: Deferred. Musculoskeletal: No  clubbing / cyanosis of digits/nails. No joint deformity upper and lower extremities.  Skin: No rashes, lesions, ulcers on a limited skin evaluation. No induration; Warm and dry.  Neurologic: CN 2-12 grossly intact with no focal deficits. Romberg sign cerebellar reflexes not assessed.  Psychiatric: Normal judgment and insight. Alert and oriented x 3. Normal mood and flat affect.   Data Reviewed: I have personally reviewed following labs and imaging studies  CBC:  Recent Labs Lab 07/05/16 1000 07/06/16 0203 07/07/16 0705 07/08/16 1008  WBC 15.2* 25.0* 7.9 8.9  NEUTROABS  --   --  5.1 6.2  HGB 13.4 10.8* 11.8* 13.2  HCT 41.3 33.3* 34.7* 39.5  MCV 89.8 90.2 87.6 88.2  PLT 546* 478* 331 703*   Basic Metabolic Panel:  Recent Labs Lab 07/06/16 0203 07/06/16 0603 07/06/16 1008 07/07/16 0705 07/08/16 0611 07/08/16 1008  NA 140 138 138 138 141  --  K 4.0 4.7 4.6 4.5 3.5  --   CL 115* 113* 112* 110 107  --   CO2 19* 18* 21* 20* 22  --   GLUCOSE 142* 167* 180* 170* 169*  --   BUN 14 13 14 9 11   --   CREATININE 0.70 0.61 0.56 0.45 0.58  --   CALCIUM 9.1 8.9 9.0 8.6* 9.4  --   MG  --   --   --   --   --  1.7  PHOS  --   --   --   --   --  4.2   GFR: Estimated Creatinine Clearance: 87.8 mL/min (by C-G formula based on SCr of 0.58 mg/dL). Liver Function Tests:  Recent Labs Lab 07/05/16 1000  AST 16  ALT 11*  ALKPHOS 96  BILITOT 0.7  PROT 7.9  ALBUMIN 4.4    Recent Labs Lab 07/05/16 1000  LIPASE 17   No results for input(s): AMMONIA in the last 168 hours. Coagulation Profile: No results for input(s): INR, PROTIME in the last 168 hours. Cardiac Enzymes: No results for input(s): CKTOTAL, CKMB, CKMBINDEX, TROPONINI in the last 168 hours. BNP (last 3 results) No results for input(s): PROBNP in the last 8760 hours. HbA1C: No results for input(s): HGBA1C in the last 72 hours. CBG:  Recent Labs Lab 07/07/16 1954 07/07/16 2337 07/08/16 0337 07/08/16 0746  07/08/16 1206  GLUCAP 229* 161* 148* 212* 182*   Lipid Profile: No results for input(s): CHOL, HDL, LDLCALC, TRIG, CHOLHDL, LDLDIRECT in the last 72 hours. Thyroid Function Tests: No results for input(s): TSH, T4TOTAL, FREET4, T3FREE, THYROIDAB in the last 72 hours. Anemia Panel: No results for input(s): VITAMINB12, FOLATE, FERRITIN, TIBC, IRON, RETICCTPCT in the last 72 hours. Sepsis Labs: No results for input(s): PROCALCITON, LATICACIDVEN in the last 168 hours.  Recent Results (from the past 240 hour(s))  Urine Culture     Status: Abnormal   Collection Time: 07/05/16 10:00 AM  Result Value Ref Range Status   Specimen Description URINE, CLEAN CATCH  Final   Special Requests NONE  Final   Culture MULTIPLE SPECIES PRESENT, SUGGEST RECOLLECTION (A)  Final   Report Status 07/07/2016 FINAL  Final    Radiology Studies: No results found.  Scheduled Meds: . enoxaparin (LOVENOX) injection  40 mg Subcutaneous Q24H  . erythromycin  250 mg Oral TID WC  . insulin aspart  0-15 Units Subcutaneous Q4H  . insulin glargine  15 Units Subcutaneous BID  . mouth rinse  15 mL Mouth Rinse BID  . metoCLOPramide (REGLAN) injection  10 mg Intravenous Q6H  . metoprolol tartrate  12.5 mg Oral BID  . mometasone-formoterol  2 puff Inhalation BID  . pantoprazole (PROTONIX) IV  40 mg Intravenous Q24H  . sodium chloride flush  3 mL Intravenous Q12H   Continuous Infusions: . sodium chloride 100 mL/hr at 07/08/16 1237  . acetaminophen Stopped (07/08/16 1049)     LOS: 2 days   Kerney Elbe, DO [facility] Pager [phone]  If 7PM-7AM, please contact night-coverage [email or web address] Password [email or web address] 07/08/2016, 1:40 PM

## 2016-07-08 NOTE — Consult Note (Addendum)
Reason for Consult: Nausea vomiting abdominal pain Referring Physician: Hospital team  [name] is an 26 y.o. female.  HPI: Patient seen and examined and her hospital computer chart reviewed and our office computer chart reviewed and she says she is not taking anything for the pain at home and she likes morphine and that's he only thing that helps her and she denies any bowel movements and problem and has not ever had a gastric emptying study and she has no new complaints and we discussed marijuana use but she does not go to the inhalational shops and she said she may have lost 10 pounds this year  Past Medical History:  Diagnosis Date  . Anxiety   . Arthritis   . Asthma   . Diabetes mellitus 2007   IDDM.  poorly controlled, multiple admits with DKA  . Gallstones   . Gastroparesis   . Heart murmur   . Hepatic steatosis 11/26/2014   and hepatomegaly  . Hypertension    NOT CURRENTLY ON ANY BP MED  . Liver mass 11/26/2014  . Pancreatitis, acute 11/26/2014    Past Surgical History:  Procedure Laterality Date  . CHOLECYSTECTOMY N/A 02/11/2015   Procedure: LAPAROSCOPIC CHOLECYSTECTOMY WITH INTRAOPERATIVE CHOLANGIOGRAM;  Surgeon: Greer Pickerel, MD;  Location: [facility];  Service: General;  Laterality: N/A;  . ESOPHAGOGASTRODUODENOSCOPY (EGD) WITH PROPOFOL Left 09/20/2014   Procedure: ESOPHAGOGASTRODUODENOSCOPY (EGD) WITH PROPOFOL;  Surgeon: Arta Silence, MD;  Location: [facility] ENDOSCOPY;  Service: Endoscopy;  Laterality: Left;  . WISDOM TOOTH EXTRACTION      Family History  Problem Relation Age of Onset  . Heart disease Maternal Grandmother   . Heart disease Maternal Grandfather   . Diabetes Mother   . Hyperlipidemia Mother   . Hypertension Father   . Heart disease Father   . Hypertension Paternal Grandmother   . Cancer Paternal Grandfather     Social History:  reports that she has never smoked. She has never used smokeless tobacco. She reports that she uses drugs, including  Marijuana. She reports that she does not drink alcohol.  Allergies:  Allergies  Allergen Reactions  . Peanut-Containing Drug Products Swelling and Other (See Comments)    Reaction:  Swelling of mouth and lips   . 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   Reaction:  Swelling of mouth and lips   . Ultram [Tramadol] Itching    Medications: I have reviewed the patient's current medications.  Results for orders placed or performed during the hospital encounter of 07/05/16 (from the past 48 hour(s))  Glucose, capillary     Status: Abnormal   Collection Time: 07/06/16  4:59 PM  Result Value Ref Range   Glucose-Capillary 212 (H) 65 - 99 mg/dL  Glucose, capillary     Status: Abnormal   Collection Time: 07/06/16  8:24 PM  Result Value Ref Range   Glucose-Capillary 195 (H) 65 - 99 mg/dL  Glucose, capillary     Status: Abnormal   Collection Time: 07/06/16 11:48 PM  Result Value Ref Range   Glucose-Capillary 172 (H) 65 - 99 mg/dL  Glucose, capillary     Status: Abnormal   Collection Time: 07/07/16  4:44 AM  Result Value Ref Range   Glucose-Capillary 111 (H) 65 - 99 mg/dL  Basic metabolic panel     Status: Abnormal   Collection Time: 07/07/16  7:05 AM  Result Value Ref Range   Sodium 138 135 - 145 mmol/L  Potassium 4.5 3.5 - 5.1 mmol/L   Chloride 110 101 - 111 mmol/L   CO2 20 (L) 22 - 32 mmol/L   Glucose, Bld 170 (H) 65 - 99 mg/dL   BUN 9 6 - 20 mg/dL   Creatinine, Ser 0.45 0.44 - 1.00 mg/dL   Calcium 8.6 (L) 8.9 - 10.3 mg/dL   GFR calc non Af Amer >60 >60 mL/min   GFR calc Af Amer >60 >60 mL/min    Comment: (NOTE) The eGFR has been calculated using the CKD EPI equation. This calculation has not been validated in all clinical situations. eGFR's persistently <60 mL/min signify possible Chronic Kidney Disease.    Anion gap 8 5 - 15  CBC with Differential/Platelet     Status: Abnormal   Collection Time: 07/07/16  7:05 AM  Result Value Ref Range    WBC 7.9 4.0 - 10.5 K/uL    Comment: WHITE COUNT CONFIRMED ON SMEAR   RBC 3.96 3.87 - 5.11 MIL/uL   Hemoglobin 11.8 (L) 12.0 - 15.0 g/dL   HCT 34.7 (L) 36.0 - 46.0 %   MCV 87.6 78.0 - 100.0 fL   MCH 29.8 26.0 - 34.0 pg   MCHC 34.0 30.0 - 36.0 g/dL   RDW 14.1 11.5 - 15.5 %   Platelets 331 150 - 400 K/uL    Comment: SPECIMEN CHECKED FOR CLOTS PLATELET COUNT CONFIRMED BY SMEAR    Neutrophils Relative % 65 %   Neutro Abs 5.1 1.7 - 7.7 K/uL   Lymphocytes Relative 24 %   Lymphs Abs 1.9 0.7 - 4.0 K/uL   Monocytes Relative 6 %   Monocytes Absolute 0.5 0.1 - 1.0 K/uL   Eosinophils Relative 4 %   Eosinophils Absolute 0.3 0.0 - 0.7 K/uL   Basophils Relative 1 %   Basophils Absolute 0.1 0.0 - 0.1 K/uL  Glucose, capillary     Status: Abnormal   Collection Time: 07/07/16  8:01 AM  Result Value Ref Range   Glucose-Capillary 198 (H) 65 - 99 mg/dL  Glucose, capillary     Status: Abnormal   Collection Time: 07/07/16 12:44 PM  Result Value Ref Range   Glucose-Capillary 176 (H) 65 - 99 mg/dL  Glucose, capillary     Status: Abnormal   Collection Time: 07/07/16  4:23 PM  Result Value Ref Range   Glucose-Capillary 179 (H) 65 - 99 mg/dL  Glucose, capillary     Status: Abnormal   Collection Time: 07/07/16  7:54 PM  Result Value Ref Range   Glucose-Capillary 229 (H) 65 - 99 mg/dL  Glucose, capillary     Status: Abnormal   Collection Time: 07/07/16 11:37 PM  Result Value Ref Range   Glucose-Capillary 161 (H) 65 - 99 mg/dL  Glucose, capillary     Status: Abnormal   Collection Time: 07/08/16  3:37 AM  Result Value Ref Range   Glucose-Capillary 148 (H) 65 - 99 mg/dL  Basic metabolic panel     Status: Abnormal   Collection Time: 07/08/16  6:11 AM  Result Value Ref Range   Sodium 141 135 - 145 mmol/L   Potassium 3.5 3.5 - 5.1 mmol/L    Comment: DELTA CHECK NOTED REPEATED TO VERIFY    Chloride 107 101 - 111 mmol/L   CO2 22 22 - 32 mmol/L   Glucose, Bld 169 (H) 65 - 99 mg/dL   BUN 11 6 - 20  mg/dL   Creatinine, Ser 0.58 0.44 - 1.00 mg/dL   Calcium 9.4  8.9 - 10.3 mg/dL   GFR calc non Af Amer >60 >60 mL/min   GFR calc Af Amer >60 >60 mL/min    Comment: (NOTE) The eGFR has been calculated using the CKD EPI equation. This calculation has not been validated in all clinical situations. eGFR's persistently <60 mL/min signify possible Chronic Kidney Disease.    Anion gap 12 5 - 15  Glucose, capillary     Status: Abnormal   Collection Time: 07/08/16  7:46 AM  Result Value Ref Range   Glucose-Capillary 212 (H) 65 - 99 mg/dL  CBC with Differential/Platelet     Status: Abnormal   Collection Time: 07/08/16 10:08 AM  Result Value Ref Range   WBC 8.9 4.0 - 10.5 K/uL   RBC 4.48 3.87 - 5.11 MIL/uL   Hemoglobin 13.2 12.0 - 15.0 g/dL   HCT 39.5 36.0 - 46.0 %   MCV 88.2 78.0 - 100.0 fL   MCH 29.5 26.0 - 34.0 pg   MCHC 33.4 30.0 - 36.0 g/dL   RDW 14.1 11.5 - 15.5 %   Platelets 463 (H) 150 - 400 K/uL   Neutrophils Relative % 70 %   Neutro Abs 6.2 1.7 - 7.7 K/uL   Lymphocytes Relative 20 %   Lymphs Abs 1.7 0.7 - 4.0 K/uL   Monocytes Relative 9 %   Monocytes Absolute 0.8 0.1 - 1.0 K/uL   Eosinophils Relative 1 %   Eosinophils Absolute 0.1 0.0 - 0.7 K/uL   Basophils Relative 0 %   Basophils Absolute 0.0 0.0 - 0.1 K/uL  Magnesium     Status: None   Collection Time: 07/08/16 10:08 AM  Result Value Ref Range   Magnesium 1.7 1.7 - 2.4 mg/dL  Phosphorus     Status: None   Collection Time: 07/08/16 10:08 AM  Result Value Ref Range   Phosphorus 4.2 2.5 - 4.6 mg/dL  Glucose, capillary     Status: Abnormal   Collection Time: 07/08/16 12:06 PM  Result Value Ref Range   Glucose-Capillary 182 (H) 65 - 99 mg/dL    No results found.  ROS negative except above Blood pressure (!) 148/107, pulse (!) 140, temperature 99.8 F (37.7 C), temperature source Oral, resp. rate 18, height 5' 4"  (1.626 m), weight 52.2 kg (115 lb), last menstrual period 06/08/2016, SpO2 100 %. Physical Exam vital  signs stable afebrile no acute distress patient lying comfortably in the bed abdomen is soft nontender occasional bowel sounds no succussion splash labs normal no recent x-rays  Assessment/Plan: Abdominal pain nausea vomiting questionable diabetic gastroparesis Plan: When better prior to discharge I think documenting her degree of gastroparesis is needed with a nuclear medicine  gastric emptying study and if no better tomorrow repeat CT scan and consider a repeat endoscopy although if CT okay I doubt it will show anything significant and may need a psychiatry to see and I'm sure there is a bit of narcotic overuse and I cautioned her about marijuana possibly even playing a role  [name] E 07/08/2016, 2:22 PM

## 2016-07-09 ENCOUNTER — Inpatient Hospital Stay (HOSPITAL_COMMUNITY): Payer: Medicaid Other

## 2016-07-09 DIAGNOSIS — E876 Hypokalemia: Secondary | ICD-10-CM

## 2016-07-09 LAB — CBC WITH DIFFERENTIAL/PLATELET
BASOS ABS: 0 10*3/uL (ref 0.0–0.1)
BASOS PCT: 0 %
Eosinophils Absolute: 0.4 10*3/uL (ref 0.0–0.7)
Eosinophils Relative: 5 %
HEMATOCRIT: 33.2 % — AB (ref 36.0–46.0)
Hemoglobin: 11 g/dL — ABNORMAL LOW (ref 12.0–15.0)
Lymphocytes Relative: 27 %
Lymphs Abs: 2 10*3/uL (ref 0.7–4.0)
MCH: 28.6 pg (ref 26.0–34.0)
MCHC: 33.1 g/dL (ref 30.0–36.0)
MCV: 86.5 fL (ref 78.0–100.0)
MONO ABS: 0.7 10*3/uL (ref 0.1–1.0)
MONOS PCT: 10 %
Neutro Abs: 4.2 10*3/uL (ref 1.7–7.7)
Neutrophils Relative %: 58 %
PLATELETS: 400 10*3/uL (ref 150–400)
RBC: 3.84 MIL/uL — ABNORMAL LOW (ref 3.87–5.11)
RDW: 14.4 % (ref 11.5–15.5)
WBC: 7.2 10*3/uL (ref 4.0–10.5)

## 2016-07-09 LAB — COMPREHENSIVE METABOLIC PANEL
ALBUMIN: 3.2 g/dL — AB (ref 3.5–5.0)
ALT: 10 U/L — ABNORMAL LOW (ref 14–54)
ANION GAP: 6 (ref 5–15)
AST: 14 U/L — AB (ref 15–41)
Alkaline Phosphatase: 72 U/L (ref 38–126)
BILIRUBIN TOTAL: 0.5 mg/dL (ref 0.3–1.2)
BUN: 14 mg/dL (ref 6–20)
CHLORIDE: 111 mmol/L (ref 101–111)
CO2: 24 mmol/L (ref 22–32)
Calcium: 8.6 mg/dL — ABNORMAL LOW (ref 8.9–10.3)
Creatinine, Ser: 0.61 mg/dL (ref 0.44–1.00)
GFR calc Af Amer: >60 mL/min (ref >60)
Glucose, Bld: 87 mg/dL (ref 65–99)
POTASSIUM: 3.2 mmol/L — AB (ref 3.5–5.1)
Sodium: 141 mmol/L (ref 135–145)
TOTAL PROTEIN: 5.8 g/dL — AB (ref 6.5–8.1)

## 2016-07-09 LAB — GLUCOSE, CAPILLARY
GLUCOSE-CAPILLARY: 144 mg/dL — AB (ref 65–99)
GLUCOSE-CAPILLARY: 329 mg/dL — AB (ref 65–99)
GLUCOSE-CAPILLARY: 159 mg/dL — AB (ref 65–99)
Glucose-Capillary: 110 mg/dL — ABNORMAL HIGH (ref 65–99)
Glucose-Capillary: 67 mg/dL (ref 65–99)
Glucose-Capillary: 33 mg/dL — CL (ref 65–99)
Glucose-Capillary: 85 mg/dL (ref 65–99)

## 2016-07-09 LAB — PHOSPHORUS: Phosphorus: 4 mg/dL (ref 2.5–4.6)

## 2016-07-09 LAB — MAGNESIUM: MAGNESIUM: 1.5 mg/dL — AB (ref 1.7–2.4)

## 2016-07-09 MED ORDER — MAGNESIUM SULFATE 2 GM/50ML IV SOLN
2.0000 g | Freq: Once | INTRAVENOUS | Status: AC
  Administered 2016-07-09: 2 g via INTRAVENOUS
  Filled 2016-07-09: qty 50

## 2016-07-09 MED ORDER — DEXTROSE 50 % IV SOLN
1.0000 | Freq: Once | INTRAVENOUS | Status: AC
  Administered 2016-07-09: 50 mL via INTRAVENOUS
  Filled 2016-07-09: qty 50

## 2016-07-09 MED ORDER — POTASSIUM CHLORIDE 10 MEQ/100ML IV SOLN
10.0000 meq | INTRAVENOUS | Status: AC
  Administered 2016-07-09 (×3): 10 meq via INTRAVENOUS
  Filled 2016-07-09 (×3): qty 100

## 2016-07-09 MED ORDER — TECHNETIUM TC 99M SULFUR COLLOID
2.2000 | Freq: Once | INTRAVENOUS | Status: AC | PRN
  Administered 2016-07-09: 2.2 via INTRAVENOUS

## 2016-07-09 NOTE — Progress Notes (Signed)
Vashti Castilla 10:47 AM  Subjective: Patient doing much better difficult to know exactly why in radiology for gastric emptying which we discussed his on twice a day Reglan at home but no other GI medicines and has not tried erythromycin at home  Objective: Vital signs stable afebrile no acute distress abdomen is soft nontender labs stable  Assessment: Questionable gastroparesis  Plan: Await gastric emptying study results probably can go home soon consider increasing Reglan dose at home or trying erythromycin 250 before meals 30 minutes and follow-up with either her primary GI or primary care in a few weeks  [facility] E  Pager [phone] After 5PM or if no answer call [phone]

## 2016-07-09 NOTE — Progress Notes (Signed)
PROGRESS NOTE    [name]  JXB:[id number] DOB: Aug 28, 1990 DOA: 07/05/2016 PCP: Vicenta Aly, FNP  Brief Narrative:  [name] is a 26 y/o F with Type 1 DM, diabetes gastroparesis with recurrent admissions, anxiety and asthma. Patient was discharged on 06/28/2016 after being managed for DKA in the end gastroparesis. She reported yesterday started having abdominal pain associated with nausea and vomiting. Upon arrival to the ED she was treated with IV fluids, nausea medication and insulin but did not improve prompting hospitalization due to exacerbation of diabetes gastroparesis. On the day of admission patient bicarb was noted to be worsening and anion gap increasing since she was started on DKA management. She is now on Long Acting Insulin and made NPO as she was still having symptoms. Gastroenterology was consulted for further recommendations as patient was not improving significantly and is on IV Metoclopramide and po Erythromycin. Dr. Watt Climes of GI recommended obtaining a NM Gastric Emptying study which was done today and showed a significantly delayed gastric emptying study. Patient states Abdominal pain is improving and wants to eat.   Assessment & Plan:   Principal Problem:   Intractable nausea and vomiting Active Problems:   Uncontrolled type 1 diabetes mellitus (HCC)   Asthma   Sinus tachycardia   Dehydration   Abdominal pain   Hypertension   Gastroparesis   GERD (gastroesophageal reflux disease)   Thrombocytosis (HCC)  Nausea/Vomiting/Abdominal Pain in the setting of Diabetic Gastroparesis -Was NPO as patient's symptoms worsened however she states her abdominal pain has improved so increased diet to FULL LIQUIDS; Has had multiple admissions for the same  -C/w Zofran po/IV q6hprn and c/w Promethazine 25 mg IV q6hprn -C/w Metoclopramide 10 mg IV q6h and added Erythromycin 250 mg po TIDwm -C/w Pantoprazole 40 mg IV daily -C/w Strict Glucose Control -Avoid  Narcotics as much as possible as it will slow down the Intestinal Tract; Was given IV Morphine 1 mg early this AM and has Oxycodone IR 5 mg q4hprn -Gastroenterology Consulted for further Recc's -Pain control with IV Acetaminophen 1000 mg q6hprn and with Ketolorlac 15 mg IV q8hprn for Moderate Pain  -Lipase normal at 17.   -GI Recommended Gastric Emptying Study and it showed a significantly delayed gastric emptying study. 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  -Will continue to Monitor and If Improved will advance Diet further and possible D/C in AM  Diabetic Ketoacidosis without coma associated with Type 1 diabetes mellitus, improved -Patient has had multiple admissions in the last 6 months -Bicarb is now 24; AG was 6 -C/w Lantus 15 unts sq BID and with Moderate Novolog SSI q4h as patient is NPO -CBG's ranging from 67-144 -Will try to advance Diet slowly and now is on FULL -Keep sugars below 200  -Gastric Emptying study as above  Sinus Tachycardia, improved -Likely secondary to dehydration  -This has been happening in the past; improvement with proper hydration. -Restarted IVF Rehydration at 100 mL/hr and improved -TSH was normal in October at 2.005; Will repeat iN AM -C/w Metoprolol 12.5 mg po BID -Telemetry with no changes; C/w Telemetry Monitoring   Essential Hypertension -Blood pressure was slight above goal - likely related to pain  -Continue Metoprolol 12.5 mg po BID -C/w Hydralazine 5 mg IV q4hprn for SBP >160 or DBP >100  Leukocytosis, improved -Was likely reactive to DKA -Improved and went from 15.2 -> 25.0 ->  7.9-> 8.9 -> 7.2 -Repeat CBC in AMand continue to monitor for S/Sx of Infection  Moderate Persistent Asthma -Remains stable  -Not in Exacerbation -C/w Xopenex 0.63 mg Neb q6hprn, Dulera 200-5 mcg/ACT  Thrombocytosis  -Platelet Count  went from 331 -> 463 -> 400 -Likely Reactive -Continue to Monitor and Repeat CBC in AM  GERD -C/w Pantoprazole 40 mg IV q24h -Patient no longer taking po Famotidine   Hypokalemia -Patient's K+ Level was 3.2 this AM -Replete with IV KCl 30 meQ -Continue to Monitor and Replete as Necessary -Repeat CMP in AM  Hypomagnesemia -Patient's Mag Level this AM was 1.5 -Replete with IV Mag Sulfate 2 grams -Continue to Monitor and Replete as Necessary -Repeat Mag level in AM  DVT prophylaxis: Enoxaparin 40 mg sq q24h Code Status: FULL CODE Family Communication: No family present at bedside Disposition Plan: Remain inpatient and D/C Home possibly in AM  Consultants:   [facility] Dr. Clarene Essex  Procedures:   None   Antimicrobials:  Anti-infectives    Start     Dose/Rate Route Frequency Ordered Stop   07/07/16 1700  erythromycin (E-MYCIN) tablet 250 mg     250 mg Oral 3 times daily with meals 07/07/16 1611     07/07/16 1515  erythromycin 250 mg in sodium chloride 0.9 % 100 mL IVPB  Status:  Discontinued     250 mg 100 mL/hr over 60 Minutes Intravenous Every 8 hours 07/07/16 1501 07/07/16 1610   07/05/16 0000  cephALEXin (KEFLEX) 500 MG capsule  Status:  Discontinued     500 mg Oral 2 times daily 07/05/16 1136 07/05/16      Subjective: Seen and examined after her gastric emptying study an was feeling better with No N/V or Abdominal Pain. Patient was wanting to eat more than a clear liquid diet. Denied any CP or SOB.   Objective: Vitals:   07/08/16 2142 07/08/16 2305 07/09/16 0130 07/09/16 1415  BP: 104/72 116/85 112/70 118/90  Pulse: (!) 107 99 100 97  Resp: 16 18 18 16   Temp: 98.3 F (36.8 C) 98.8 F (37.1 C) 97.9 F (36.6 C) 99.1 F (37.3 C)  TempSrc: Oral Oral Oral Oral  SpO2: 100% 99% 99% 100%  Weight:      Height:        Intake/Output Summary (Last 24 hours) at 07/09/16 1504 Last data filed at 07/09/16 0900  Gross per 24 hour  Intake              1960 ml  Output                0 ml  Net             1960 ml   Filed Weights   07/05/16 1702  Weight: 52.2 kg (115 lb)   Examination: Physical Exam:  Constitutional: Thin AAF in NAD appears calm and comfortable laying in bed.  Eyes: Sclerae anicteric. Conjunctivae non-injected ENMT: External ears and nose appear normal. Mucous membranes appear moist and grossly normal hearing Neck: Supple with no JVD or visible masses Respiratory: CTAB. No wheezing/rales/rhonchi. Patient was not tachypenic or using any accessory muscles to breathe Cardiovascular: Slightly tachycardic rate but regular rhythm. S1, S2. No appreciable LE edema Abdomen: Soft. NT, ND. Bowel sounds present. No appreciable masses GU: Deferred Musculoskeletal: No contractures. No cyanosis Skin: Warm and Dry. No rashes or lesions noted on a limited skin eval Neurologic: CN 2-12 grossly intact. No appreciable focal deficits Psychiatric: Awake and  alert. Normal appearing mood but flat affect.   Data Reviewed: I have personally reviewed following labs and imaging studies  CBC:  Recent Labs Lab 07/05/16 1000 07/06/16 0203 07/07/16 0705 07/08/16 1008 07/09/16 0543  WBC 15.2* 25.0* 7.9 8.9 7.2  NEUTROABS  --   --  5.1 6.2 4.2  HGB 13.4 10.8* 11.8* 13.2 11.0*  HCT 41.3 33.3* 34.7* 39.5 33.2*  MCV 89.8 90.2 87.6 88.2 86.5  PLT 546* 478* 331 463* 035   Basic Metabolic Panel:  Recent Labs Lab 07/06/16 0603 07/06/16 1008 07/07/16 0705 07/08/16 0611 07/08/16 1008 07/09/16 0543  NA 138 138 138 141  --  141  K 4.7 4.6 4.5 3.5  --  3.2*  CL 113* 112* 110 107  --  111  CO2 18* 21* 20* 22  --  24  GLUCOSE 167* 180* 170* 169*  --  87  BUN 13 14 9 11   --  14  CREATININE 0.61 0.56 0.45 0.58  --  0.61  CALCIUM 8.9 9.0 8.6* 9.4  --  8.6*  MG  --   --   --   --  1.7 1.5*  PHOS  --   --   --   --  4.2 4.0   GFR: Estimated Creatinine Clearance: 87.8 mL/min (by C-G formula based on SCr of 0.61 mg/dL). Liver Function  Tests:  Recent Labs Lab 07/05/16 1000 07/09/16 0543  AST 16 14*  ALT 11* 10*  ALKPHOS 96 72  BILITOT 0.7 0.5  PROT 7.9 5.8*  ALBUMIN 4.4 3.2*    Recent Labs Lab 07/05/16 1000  LIPASE 17   No results for input(s): AMMONIA in the last 168 hours. Coagulation Profile: No results for input(s): INR, PROTIME in the last 168 hours. Cardiac Enzymes: No results for input(s): CKTOTAL, CKMB, CKMBINDEX, TROPONINI in the last 168 hours. BNP (last 3 results) No results for input(s): PROBNP in the last 8760 hours. HbA1C: No results for input(s): HGBA1C in the last 72 hours. CBG:  Recent Labs Lab 07/08/16 2026 07/08/16 2323 07/09/16 0339 07/09/16 0752 07/09/16 1107  GLUCAP 124* 110* 110* 67 144*   Lipid Profile: No results for input(s): CHOL, HDL, LDLCALC, TRIG, CHOLHDL, LDLDIRECT in the last 72 hours. Thyroid Function Tests: No results for input(s): TSH, T4TOTAL, FREET4, T3FREE, THYROIDAB in the last 72 hours. Anemia Panel: No results for input(s): VITAMINB12, FOLATE, FERRITIN, TIBC, IRON, RETICCTPCT in the last 72 hours. Sepsis Labs: No results for input(s): PROCALCITON, LATICACIDVEN in the last 168 hours.  Recent Results (from the past 240 hour(s))  Urine Culture     Status: Abnormal   Collection Time: 07/05/16 10:00 AM  Result Value Ref Range Status   Specimen Description URINE, CLEAN CATCH  Final   Special Requests NONE  Final   Culture MULTIPLE SPECIES PRESENT, SUGGEST RECOLLECTION (A)  Final   Report Status 07/07/2016 FINAL  Final    Radiology Studies: Nm Gastric Emptying  Result Date: 07/09/2016 CLINICAL DATA:  Nausea and vomiting, abdominal pain for 2 years, diabetes mellitus EXAM: NUCLEAR MEDICINE GASTRIC EMPTYING SCAN TECHNIQUE: After oral ingestion of radiolabeled meal, sequential abdominal images were obtained for 4 hours. Percentage of activity emptying the stomach was calculated at 1 hour, 2 hour, 3 hour, and 4 hours. RADIOPHARMACEUTICALS:  2.2 mCi Tc-78m  sulfur colloid in standardized meal COMPARISON:  None FINDINGS: Expected location of the stomach in the left upper quadrant. Ingested meal empties the stomach poorly over the course of the study. 2% emptied  at 1 hr ( normal >= 10%) 16% emptied at 2 hr ( normal >= 40%) 20% emptied at 3 hr ( normal >= 70%) 24% emptied at 4 hr ( normal >= 90%) IMPRESSION: Significantly delayed gastric emptying study. Electronically Signed   By: Lavonia Dana M.D.   On: 07/09/2016 14:23    Scheduled Meds: . enoxaparin (LOVENOX) injection  40 mg Subcutaneous Q24H  . erythromycin  250 mg Oral TID WC  . insulin aspart  0-15 Units Subcutaneous Q4H  . insulin glargine  15 Units Subcutaneous BID  . mouth rinse  15 mL Mouth Rinse BID  . metoCLOPramide (REGLAN) injection  10 mg Intravenous Q6H  . metoprolol tartrate  12.5 mg Oral BID  . mometasone-formoterol  2 puff Inhalation BID  . pantoprazole (PROTONIX) IV  40 mg Intravenous Q24H  . sodium chloride flush  3 mL Intravenous Q12H   Continuous Infusions: . sodium chloride 100 mL/hr at 07/09/16 0509    LOS: 3 days   Kerney Elbe, DO [facility] Pager [phone]  If 7PM-7AM, please contact night-coverage [email or web address] Password [facility] 07/09/2016, 3:04 PM

## 2016-07-09 NOTE — Progress Notes (Signed)
Pt continue to have N/V. Will continue to follow for discharge plan.

## 2016-07-09 NOTE — Progress Notes (Signed)
CRITICAL VALUE ALERT  Critical Value:Blood Glucose 33  Date & Time Notied:  07/09/16 @ 2040  Provider Notified: yes  Orders Received/Actions taken:Awaiting orders

## 2016-07-10 LAB — CBC WITH DIFFERENTIAL/PLATELET
Basophils Absolute: 0 10*3/uL (ref 0.0–0.1)
Basophils Relative: 0 %
Eosinophils Absolute: 0.4 10*3/uL (ref 0.0–0.7)
Eosinophils Relative: 8 %
HEMATOCRIT: 33.2 % — AB (ref 36.0–46.0)
Hemoglobin: 10.9 g/dL — ABNORMAL LOW (ref 12.0–15.0)
LYMPHS PCT: 33 %
Lymphs Abs: 1.7 10*3/uL (ref 0.7–4.0)
MCH: 29 pg (ref 26.0–34.0)
MCHC: 32.8 g/dL (ref 30.0–36.0)
MCV: 88.3 fL (ref 78.0–100.0)
MONO ABS: 0.6 10*3/uL (ref 0.1–1.0)
MONOS PCT: 11 %
NEUTROS ABS: 2.6 10*3/uL (ref 1.7–7.7)
Neutrophils Relative %: 48 %
Platelets: 357 10*3/uL (ref 150–400)
RBC: 3.76 MIL/uL — ABNORMAL LOW (ref 3.87–5.11)
RDW: 14.3 % (ref 11.5–15.5)
WBC: 5.3 10*3/uL (ref 4.0–10.5)

## 2016-07-10 LAB — COMPREHENSIVE METABOLIC PANEL
ALT: 12 U/L — ABNORMAL LOW (ref 14–54)
ANION GAP: 3 — AB (ref 5–15)
AST: 20 U/L (ref 15–41)
Albumin: 2.9 g/dL — ABNORMAL LOW (ref 3.5–5.0)
Alkaline Phosphatase: 73 U/L (ref 38–126)
BILIRUBIN TOTAL: 0.5 mg/dL (ref 0.3–1.2)
BUN: 10 mg/dL (ref 6–20)
CO2: 26 mmol/L (ref 22–32)
Calcium: 8.2 mg/dL — ABNORMAL LOW (ref 8.9–10.3)
Chloride: 110 mmol/L (ref 101–111)
Creatinine, Ser: 0.45 mg/dL (ref 0.44–1.00)
GFR calc Af Amer: >60 mL/min (ref >60)
Glucose, Bld: 179 mg/dL — ABNORMAL HIGH (ref 65–99)
POTASSIUM: 3.4 mmol/L — AB (ref 3.5–5.1)
Sodium: 139 mmol/L (ref 135–145)
TOTAL PROTEIN: 5.3 g/dL — AB (ref 6.5–8.1)

## 2016-07-10 LAB — GLUCOSE, CAPILLARY
GLUCOSE-CAPILLARY: 142 mg/dL — AB (ref 65–99)
GLUCOSE-CAPILLARY: 200 mg/dL — AB (ref 65–99)
GLUCOSE-CAPILLARY: 244 mg/dL — AB (ref 65–99)
GLUCOSE-CAPILLARY: 308 mg/dL — AB (ref 65–99)
GLUCOSE-CAPILLARY: 40 mg/dL — AB (ref 65–99)
Glucose-Capillary: 177 mg/dL — ABNORMAL HIGH (ref 65–99)
Glucose-Capillary: 96 mg/dL (ref 65–99)

## 2016-07-10 LAB — MAGNESIUM: Magnesium: 1.6 mg/dL — ABNORMAL LOW (ref 1.7–2.4)

## 2016-07-10 LAB — PHOSPHORUS: PHOSPHORUS: 3.6 mg/dL (ref 2.5–4.6)

## 2016-07-10 MED ORDER — INSULIN GLARGINE 100 UNIT/ML ~~LOC~~ SOLN
12.0000 [IU] | Freq: Every day | SUBCUTANEOUS | Status: DC
  Filled 2016-07-10: qty 0.12

## 2016-07-10 MED ORDER — INSULIN ASPART 100 UNIT/ML ~~LOC~~ SOLN: 0.0000 [IU] | Freq: Every day | SUBCUTANEOUS | Status: DC

## 2016-07-10 MED ORDER — MAGNESIUM SULFATE 2 GM/50ML IV SOLN
2.0000 g | Freq: Once | INTRAVENOUS | Status: AC
  Administered 2016-07-10: 2 g via INTRAVENOUS
  Filled 2016-07-10: qty 50

## 2016-07-10 MED ORDER — INSULIN GLARGINE 100 UNIT/ML ~~LOC~~ SOLN
12.0000 [IU] | Freq: Every day | SUBCUTANEOUS | Status: DC
  Administered 2016-07-10: 12 [IU] via SUBCUTANEOUS
  Filled 2016-07-10 (×2): qty 0.12

## 2016-07-10 MED ORDER — PANTOPRAZOLE SODIUM 40 MG PO TBEC
40.0000 mg | DELAYED_RELEASE_TABLET | Freq: Two times a day (BID) | ORAL | Status: DC
  Administered 2016-07-10 – 2016-07-11 (×2): 40 mg via ORAL
  Filled 2016-07-10 (×2): qty 1

## 2016-07-10 MED ORDER — DEXTROSE 50 % IV SOLN
1.0000 | Freq: Once | INTRAVENOUS | Status: AC
  Administered 2016-07-10: 50 mL via INTRAVENOUS
  Filled 2016-07-10: qty 50

## 2016-07-10 MED ORDER — INSULIN ASPART 100 UNIT/ML ~~LOC~~ SOLN
0.0000 [IU] | Freq: Three times a day (TID) | SUBCUTANEOUS | Status: DC
  Administered 2016-07-10: 7 [IU] via SUBCUTANEOUS
  Administered 2016-07-11 (×2): 3 [IU] via SUBCUTANEOUS

## 2016-07-10 MED ORDER — POTASSIUM CHLORIDE CRYS ER 20 MEQ PO TBCR
40.0000 meq | EXTENDED_RELEASE_TABLET | Freq: Two times a day (BID) | ORAL | Status: AC
  Administered 2016-07-10 (×2): 40 meq via ORAL
  Filled 2016-07-10 (×2): qty 2

## 2016-07-10 MED ORDER — METOCLOPRAMIDE HCL 10 MG PO TABS: 10.0000 mg | ORAL_TABLET | Freq: Three times a day (TID) | ORAL | Status: DC

## 2016-07-10 MED ORDER — LOPERAMIDE HCL 2 MG PO CAPS
2.0000 mg | ORAL_CAPSULE | ORAL | Status: DC | PRN
  Administered 2016-07-10: 2 mg via ORAL
  Filled 2016-07-10: qty 1

## 2016-07-10 NOTE — Progress Notes (Addendum)
Inpatient Diabetes Program Recommendations  AACE/ADA: New Consensus Statement on Inpatient Glycemic Control (2015)  Target Ranges:  Prepandial:   less than 140 mg/dL      Peak postprandial:   less than 180 mg/dL (1-2 hours)      Critically ill patients:  140 - 180 mg/dL   Results for [name], [name] (MRN [id number]) as of 07/10/2016 07:41  Ref. Range 07/08/2016 23:23 07/09/2016 03:39 07/09/2016 07:52 07/09/2016 11:07 07/09/2016 16:24 07/09/2016 20:28 07/09/2016 21:33  Glucose-Capillary Latest Ref Range: 65 - 99 mg/dL 110 (H) 110 (H) 67 144 (H) 329 (H) 33 (LL) 159 (H)   Results for [name], [name] (MRN [id number]) as of 07/10/2016 07:41  Ref. Range 07/09/2016 23:11 07/10/2016 04:46  Glucose-Capillary Latest Ref Range: 65 - 99 mg/dL 85 40 (LL)    Home DM Meds: Lantus 12 units QHS       Novolog 1 unit for 10 grams of carbohydrates       Novolog SSI  Current Insulin Orders: Lantus 15 units BID      Novolog Moderate Correction Scale/ SSI (0-15 units) Q4 hours      MD- Reviewed Endocrinology notes from [facility] (Dr. Hartford Poli with [facility]) from 05/26/16.  Per MD notes, patient was instructed to reduce her Lantus to 12 units QHS and to continue to take her Novolog after meals.  Patient has had several Hypoglycemic events since yesterday (CBG 67 mg/dl yesterday at 8am, CBG 33 mg/dl yesterday at 8pm, and CBG 40 mg/dl this morning at 4am).    Per notes, taking much less Lantus at home.  Please consider the following:  1. Reduce Lantus to 12 units QHS (home dose)  2. Reduce Novolog SSI to Novolog Sensitive Correction Scale/ SSI (0-9 units) TID AC + HS (currently ordered as Moderate scale 0-15 units Q4hours)  3. Start Custom Novolog Meal Coverage: Novolog 1 unit for every 10 grams of Carbohydrates TID with meals      --Will follow patient during hospitalization--  Wyn Quaker RN, MSN, CDE Diabetes Coordinator Inpatient Glycemic Control Team Team Pager: [phone]  (8a-5p)

## 2016-07-10 NOTE — Progress Notes (Signed)
[name] 11:27 AM  Subjective: Patient doing well and hopefully will go home soon and we discussed her nuclear scan and talked about Reglan versus erythromycin and she had questions about disability and we discussed her case with the hospital team  Objective: Vital signs stable afebrile no acute distress patient looks well not examined today nuclear scan highly abnormal  Assessment: Delayed gastric emptying  Plan: Would try by mouth erythromycin 250 30 minutes before meals however she can go back to Reglan if she likes it better and follow-up with her primary GI [name] in a few weeks and call us if we could be of any further assistance with this hospital stay  [facility]  Pager [phone] After 5PM or if no answer call [phone]

## 2016-07-10 NOTE — Progress Notes (Signed)
PROGRESS NOTE    [name]  XTK:[id number] DOB: 1990-03-25 DOA: 07/05/2016 PCP: Vicenta Aly, FNP  Brief Narrative:  [name] is a 26 y/o F with Type 1 DM, diabetes gastroparesis with recurrent admissions, anxiety and asthma. Patient was discharged on 06/28/2016 after being managed for DKA in the end gastroparesis. She reported yesterday started having abdominal pain associated with nausea and vomiting. Upon arrival to the ED she was treated with IV fluids, nausea medication and insulin but did not improve prompting hospitalization due to exacerbation of diabetes gastroparesis. On the day of admission patient bicarb was noted to be worsening and anion gap increasing since she was started on DKA management. She is now on Long Acting Insulin and made NPO as she was still having symptoms. Gastroenterology was consulted for further recommendations as patient was not improving significantly and is on IV Metoclopramide and po Erythromycin. Dr. Watt Climes of GI recommended obtaining a NM Gastric Emptying study which was done today and showed a significantly delayed gastric emptying study. Patient states Abdominal pain is improving and wants to eat so her diet was advanced to a Carb Modified Diet. Patient was hypoglycemic overnight x2 so Insulin Regimen was adjusted. Gastroenterology recommended placing the patient on Erythromycin 250 mg approximately 30 minutes prior to meals. Hopefully able to D/C in AM.   Assessment & Plan:   Principal Problem:   Intractable nausea and vomiting Active Problems:   Uncontrolled type 1 diabetes mellitus (HCC)   Asthma   Sinus tachycardia   Dehydration   Abdominal pain   Hypertension   Gastroparesis   GERD (gastroesophageal reflux disease)   Thrombocytosis (HCC)  Nausea/Vomiting/Abdominal Pain in the setting of Diabetic Gastroparesis, improved  -Was NPO as patient's symptoms worsened however she states her abdominal pain has improved so increased diet  from FULL LIQUIDS to Carb Modified; Has had multiple admissions for the same  -C/w Zofran po/IV q6hprn and c/w Promethazine 25 mg IV q6hprn -D/C'd Metoclopramide 10 mg IV q6h a -C/w Erythromycin 250 mg po TIDwm -Changed Pantoprazole 40 mg IV daily to home dose of Pantoprazole 40 mg po BID -C/w Strict Glucose Control -Avoid Narcotics as much as possible as it will slow down the Intestinal Tract; Was given IV Morphine 1 mg early this AM and has Oxycodone IR 5 mg q4hprn -Gastroenterology Consulted for further Recc's -Pain control with IV Acetaminophen 1000 mg q6hprn and with Ketolorlac 15 mg IV q8hprn for Moderate Pain  -Lipase normal at 17.   -GI Recommended Gastric Emptying Study and it showed a significantly delayed gastric emptying study. 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  -Will continue to Monitor and now diet is advanced  Diabetic Ketoacidosis without coma associated with Type 1 diabetes mellitus, improved -Patient has had multiple admissions in the last 6 months -Bicarb is now 26; AG was 3 -Diabetic Chemical engineer and Appreciated Recc's -Changed Lantus 15 unts sq BID to Lantus 12 units sq qHS and changed Moderate Novolog SSI q4h to Sensitive Novolog SSI AC/HS as patient was Hypoglycemic overnight  -CBG's ranging from 33-177 with one abnormal value of 308; -She received D50 x 2 Ampule's last night  -Advanced Diet slowly and now is on Carb Modified Diet -Keep sugars below 200  -Gastric Emptying study as above -Continue to Monitor BS -Patient will need to follow up with Endocrinology at D/C  Sinus Tachycardia, improved -Likely secondary  to dehydration  -This has been happening in the past; improvement with proper hydration. -Restarted IVF Rehydration at 100 mL/hr and now decreased to 50 mL/hr -TSH was normal in October at 2.005; Will need  repeat as an outpatient  -C/w Metoprolol 12.5 mg po BID -Telemetry with no changes; C/w Telemetry Monitoring   Essential Hypertension -Blood pressure was at Goal -Continue Metoprolol 12.5 mg po BID -C/w Hydralazine 5 mg IV q4hprn for SBP >160 or DBP >100  Leukocytosis, improved -Was likely reactive to DKA -Improved and went from 15.2 -> 25.0 -> 7.9-> 8.9 -> 7.2 -> 5.3 -Repeat CBC in AM and Continue to monitor for S/Sx of Infection  Moderate Persistent Asthma -Remains stable  -Not in Exacerbation -C/w Xopenex 0.63 mg Neb q6hprn, Dulera 200-5 mcg/ACT  Thrombocytosis  -Platelet Count went from 331 -> 463 -> 400 -> 357 -Likely Reactive -Continue to Monitor and Repeat CBC in AM  GERD -Changed Pantoprazole 40 mg IV q24h to po Pantoprazole 40 mg po BID -Patient no longer taking po Famotidine   Hypokalemia -Patient's K+ Level was 3.4 this AM -Replete with po 40 mEQ KCl BID x2 doses -Continue to Monitor and Replete as Necessary -Repeat CMP in AM  Hypomagnesemia -Patient's Mag Level this AM was 1.6 -Replete with IV Mag Sulfate 2 grams -Continue to Monitor and Replete as Necessary -Repeat Mag level in AM  Diarrhea/Loose Stools -Not on Any stool softeners -Possibly from Erythromycin -Low Suspicion for C Difficile as patient does not have a temperature and WBC is not elevated -Asked Nursing to put a Hat in the toilet -Given Loperamide 2 mg po as needed for diarrhea/loose stools -Continue to Monitor   DVT prophylaxis: Enoxaparin 40 mg sq q24h Code Status: FULL CODE Family Communication: No family present at bedside Disposition Plan: Remain inpatient and D/C Home possibly in AM  Consultants:   [facility] Dr. Clarene Essex  Procedures:   None   Antimicrobials:  Anti-infectives    Start     Dose/Rate Route Frequency Ordered Stop   07/07/16 1700  erythromycin (E-MYCIN) tablet 250 mg     250 mg Oral 3 times daily with meals 07/07/16 1611     07/07/16 1515   erythromycin 250 mg in sodium chloride 0.9 % 100 mL IVPB  Status:  Discontinued     250 mg 100 mL/hr over 60 Minutes Intravenous Every 8 hours 07/07/16 1501 07/07/16 1610   07/05/16 0000  cephALEXin (KEFLEX) 500 MG capsule  Status:  Discontinued     500 mg Oral 2 times daily 07/05/16 1136 07/05/16      Subjective: Seen and examined and states her nausea and vomiting had resolved and was extremely hungry and wanted to eat a proper meal. No CP or SOB. Nursing paged later and stated patient reported some loose stools/diarrhea. No other concerns or complaints at this time.    Objective: Vitals:   07/09/16 1856 07/09/16 2159 07/10/16 0454 07/10/16 0924  BP: 106/69 101/63 109/79 119/80  Pulse: 97 100 92 98  Resp: 16 16 18    Temp: 98.2 F (36.8 C) 98.4 F (36.9 C) 98.6 F (37 C)   TempSrc: Oral Oral Oral   SpO2: 100% 100% 100%   Weight:      Height:        Intake/Output Summary (Last 24 hours) at 07/10/16 1455 Last data filed at 07/10/16 1240  Gross per 24 hour  Intake          3321.67 ml  Output                5 ml  Net          3316.67 ml   Filed Weights   07/05/16 1702  Weight: 52.2 kg (115 lb)   Examination: Physical Exam:  Constitutional: Thin AAF in NAD. Appears calm and comfortable and wanting to eat a proper meal  Eyes: Sclerae anicteric; Conjunctivae Non-injected.  ENMT: Grossly normal hearing. External ears and nose appear normal. Mucous membranes appear moist Neck: Supple with no JVD. Respiratory: CTAB. No wheezing/rales/rhonchi. Patient was not tachypenic or using any accessory muscles to breathe Cardiovascular: RRR but slightly on the faster side. No LE Edema Abdomen: Soft, NT, ND. Bowel sounds present. GU: Deferred Musculoskeletal: No cyanosis or clubbing appreciated. No contractures Skin: Warm and Dry. No rashes or lesions appreciated on a limited skin eval Neurologic: CN 2-12 grossly intact. No appreciable focal deficits Psychiatric: Normal mood and affect.  Awake and Alert and Oriented x 3.  Data Reviewed: I have personally reviewed following labs and imaging studies  CBC:  Recent Labs Lab 07/06/16 0203 07/07/16 0705 07/08/16 1008 07/09/16 0543 07/10/16 0525  WBC 25.0* 7.9 8.9 7.2 5.3  NEUTROABS  --  5.1 6.2 4.2 2.6  HGB 10.8* 11.8* 13.2 11.0* 10.9*  HCT 33.3* 34.7* 39.5 33.2* 33.2*  MCV 90.2 87.6 88.2 86.5 88.3  PLT 478* 331 463* 400 382   Basic Metabolic Panel:  Recent Labs Lab 07/06/16 1008 07/07/16 0705 07/08/16 0611 07/08/16 1008 07/09/16 0543 07/10/16 0525  NA 138 138 141  --  141 139  K 4.6 4.5 3.5  --  3.2* 3.4*  CL 112* 110 107  --  111 110  CO2 21* 20* 22  --  24 26  GLUCOSE 180* 170* 169*  --  87 179*  BUN 14 9 11   --  14 10  CREATININE 0.56 0.45 0.58  --  0.61 0.45  CALCIUM 9.0 8.6* 9.4  --  8.6* 8.2*  MG  --   --   --  1.7 1.5* 1.6*  PHOS  --   --   --  4.2 4.0 3.6   GFR: Estimated Creatinine Clearance: 87.8 mL/min (by C-G formula based on SCr of 0.45 mg/dL). Liver Function Tests:  Recent Labs Lab 07/05/16 1000 07/09/16 0543 07/10/16 0525  AST 16 14* 20  ALT 11* 10* 12*  ALKPHOS 96 72 73  BILITOT 0.7 0.5 0.5  PROT 7.9 5.8* 5.3*  ALBUMIN 4.4 3.2* 2.9*    Recent Labs Lab 07/05/16 1000  LIPASE 17   No results for input(s): AMMONIA in the last 168 hours. Coagulation Profile: No results for input(s): INR, PROTIME in the last 168 hours. Cardiac Enzymes: No results for input(s): CKTOTAL, CKMB, CKMBINDEX, TROPONINI in the last 168 hours. BNP (last 3 results) No results for input(s): PROBNP in the last 8760 hours. HbA1C: No results for input(s): HGBA1C in the last 72 hours. CBG:  Recent Labs Lab 07/09/16 2311 07/10/16 0446 07/10/16 0808 07/10/16 0932 07/10/16 1224  GLUCAP 85 40* 177* 142* 308*   Lipid Profile: No results for input(s): CHOL, HDL, LDLCALC, TRIG, CHOLHDL, LDLDIRECT in the last 72 hours. Thyroid Function Tests: No results for input(s): TSH, T4TOTAL, FREET4, T3FREE,  THYROIDAB in the last 72 hours. Anemia Panel: No results for input(s): VITAMINB12, FOLATE, FERRITIN, TIBC, IRON, RETICCTPCT in the last 72 hours. Sepsis Labs: No results for input(s): PROCALCITON, LATICACIDVEN in the last 168 hours.  Recent  Results (from the past 240 hour(s))  Urine Culture     Status: Abnormal   Collection Time: 07/05/16 10:00 AM  Result Value Ref Range Status   Specimen Description URINE, CLEAN CATCH  Final   Special Requests NONE  Final   Culture MULTIPLE SPECIES PRESENT, SUGGEST RECOLLECTION (A)  Final   Report Status 07/07/2016 FINAL  Final    Radiology Studies: Nm Gastric Emptying  Result Date: 07/09/2016 CLINICAL DATA:  Nausea and vomiting, abdominal pain for 2 years, diabetes mellitus EXAM: NUCLEAR MEDICINE GASTRIC EMPTYING SCAN TECHNIQUE: After oral ingestion of radiolabeled meal, sequential abdominal images were obtained for 4 hours. Percentage of activity emptying the stomach was calculated at 1 hour, 2 hour, 3 hour, and 4 hours. RADIOPHARMACEUTICALS:  2.2 mCi Tc-57m sulfur colloid in standardized meal COMPARISON:  None FINDINGS: Expected location of the stomach in the left upper quadrant. Ingested meal empties the stomach poorly over the course of the study. 2% emptied at 1 hr ( normal >= 10%) 16% emptied at 2 hr ( normal >= 40%) 20% emptied at 3 hr ( normal >= 70%) 24% emptied at 4 hr ( normal >= 90%) IMPRESSION: Significantly delayed gastric emptying study. Electronically Signed   By: Lavonia Dana M.D.   On: 07/09/2016 14:23    Scheduled Meds: . enoxaparin (LOVENOX) injection  40 mg Subcutaneous Q24H  . erythromycin  250 mg Oral TID WC  . insulin aspart  0-5 Units Subcutaneous QHS  . insulin aspart  0-9 Units Subcutaneous TID WC  . insulin glargine  12 Units Subcutaneous QHS  . mouth rinse  15 mL Mouth Rinse BID  . metoprolol tartrate  12.5 mg Oral BID  . mometasone-formoterol  2 puff Inhalation BID  . pantoprazole  40 mg Oral BID AC  . potassium chloride   40 mEq Oral BID  . sodium chloride flush  3 mL Intravenous Q12H   Continuous Infusions: . sodium chloride 50 mL/hr at 07/10/16 1240    LOS: 4 days   Kerney Elbe, DO [facility] Pager [phone]  If 7PM-7AM, please contact night-coverage [email or web address] Password [facility] 07/10/2016, 2:55 PM

## 2016-07-10 NOTE — Progress Notes (Signed)
Spoke with pt in length to offer assistance to her help prevent readmission. Pt did not want any HH services.

## 2016-07-10 NOTE — Progress Notes (Signed)
CRITICAL VALUE ALERT  Critical Value:CBG 40  Date & Time Notied:  07/10/16 0505  Provider Notified: yes  Orders Received/Actions taken: Give D 50 and hold Insulin until in the morning

## 2016-07-11 LAB — CBC WITH DIFFERENTIAL/PLATELET
BASOS ABS: 0 10*3/uL (ref 0.0–0.1)
Basophils Relative: 0 %
Eosinophils Absolute: 0.4 10*3/uL (ref 0.0–0.7)
Eosinophils Relative: 6 %
HEMATOCRIT: 29.3 % — AB (ref 36.0–46.0)
Hemoglobin: 9.8 g/dL — ABNORMAL LOW (ref 12.0–15.0)
Lymphocytes Relative: 33 %
Lymphs Abs: 1.8 10*3/uL (ref 0.7–4.0)
MCH: 29.3 pg (ref 26.0–34.0)
MCHC: 33.4 g/dL (ref 30.0–36.0)
MCV: 87.5 fL (ref 78.0–100.0)
MONO ABS: 0.5 10*3/uL (ref 0.1–1.0)
Monocytes Relative: 9 %
NEUTROS ABS: 2.8 10*3/uL (ref 1.7–7.7)
Neutrophils Relative %: 52 %
Platelets: 367 10*3/uL (ref 150–400)
RBC: 3.35 MIL/uL — AB (ref 3.87–5.11)
RDW: 13.9 % (ref 11.5–15.5)
WBC: 5.5 10*3/uL (ref 4.0–10.5)

## 2016-07-11 LAB — COMPREHENSIVE METABOLIC PANEL
ALT: 12 U/L — AB (ref 14–54)
AST: 16 U/L (ref 15–41)
Albumin: 3 g/dL — ABNORMAL LOW (ref 3.5–5.0)
Alkaline Phosphatase: 77 U/L (ref 38–126)
Anion gap: 4 — ABNORMAL LOW (ref 5–15)
BILIRUBIN TOTAL: 0.4 mg/dL (ref 0.3–1.2)
BUN: 5 mg/dL — AB (ref 6–20)
CALCIUM: 8.4 mg/dL — AB (ref 8.9–10.3)
CO2: 26 mmol/L (ref 22–32)
CREATININE: 0.43 mg/dL — AB (ref 0.44–1.00)
Chloride: 107 mmol/L (ref 101–111)
GFR calc Af Amer: >60 mL/min (ref >60)
Glucose, Bld: 260 mg/dL — ABNORMAL HIGH (ref 65–99)
Potassium: 3.9 mmol/L (ref 3.5–5.1)
Sodium: 137 mmol/L (ref 135–145)
Total Protein: 5.3 g/dL — ABNORMAL LOW (ref 6.5–8.1)

## 2016-07-11 LAB — GLUCOSE, CAPILLARY
Glucose-Capillary: 248 mg/dL — ABNORMAL HIGH (ref 65–99)
Glucose-Capillary: 215 mg/dL — ABNORMAL HIGH (ref 65–99)
Glucose-Capillary: 221 mg/dL — ABNORMAL HIGH (ref 65–99)

## 2016-07-11 LAB — MAGNESIUM: MAGNESIUM: 1.6 mg/dL — AB (ref 1.7–2.4)

## 2016-07-11 LAB — PHOSPHORUS: Phosphorus: 3.3 mg/dL (ref 2.5–4.6)

## 2016-07-11 MED ORDER — ERYTHROMYCIN BASE 250 MG PO TABS: 250.0000 mg | ORAL_TABLET | Freq: Three times a day (TID) | ORAL | 0 refills | Status: DC

## 2016-07-11 MED ORDER — MAGNESIUM SULFATE 2 GM/50ML IV SOLN
2.0000 g | Freq: Once | INTRAVENOUS | Status: AC
  Administered 2016-07-11: 2 g via INTRAVENOUS
  Filled 2016-07-11: qty 50

## 2016-07-11 NOTE — Discharge Summary (Signed)
Physician Discharge Summary  [name] FTD:[id number] DOB: Jun 04, 1990 DOA: 07/05/2016  PCP: Vicenta Aly, FNP  Admit date: 07/05/2016 Discharge date: 07/11/2016  Admitted From: Home Disposition: Home  Recommendations for Outpatient Follow-up:  1. Follow up with PCP in 1-2 weeks 2. Follow up with [facility] Gastroenterologist Dr. Arta Silence in 1-2 weeks 3. Follow up with [facility] Endocrinologist in 1-2 weeks 4. Please obtain CMP/CBC, Mag, Phos in one week 5. Repeat TSH and Free T4 as an outpatient   Home Health: No Equipment/Devices: None  Discharge Condition: Stable  CODE STATUS: FULL CODE Diet recommendation: Carb Modified Diet  Brief/Interim Summary: Stefanie Moreheadis a 26 y/o F with Type 1 DM, diabetes gastroparesis with recurrent admissions, anxiety and asthma. Patient was discharged on 06/28/2016 after being managed for DKA and gastroparesis. She reported the day prior to this Admission she started having abdominal pain associated with nausea and vomiting. Upon arrival to the ED she was treated with IV fluids, nausea medication and insulin but did not improve prompting hospitalization due to exacerbation of diabetes gastroparesis. On the day of admission patient's bicarb was noted to be worsening and anion gap increasing since she was started on DKA management. She was transitioned to Long Acting Insulin however had to be made NPO as she was still having symptoms. Gastroenterology was consulted for further recommendations as patient was not improving significantly and on IV Metoclopramide and po Erythromycin. Dr. Watt Climes of GI recommended obtaining a NM Gastric Emptying study which was done today and showed a significantly delayed gastric emptying study. Patient steadily improved and stated Abdominal pain is improving and wants to eat so her diet was advanced to a Carb Modified Diet. Patient was hypoglycemic the night prior x2 so Insulin Regimen was adjusted. Gastroenterology  recommended placing the patient on Erythromycin 250 mg approximately 30 minutes prior to meals and stopping the po Metoclopramide. She improved and tolerated diet well with no issues and abdominal pain. She was deemed medically stable at this time to follow up with PCP, Endocrinology, and Gastroenterology. She will need to be compliant with her medications and patient was referred to the Ambulatory Nutrition and Diabetic Education Coordinator.   Discharge Diagnoses:  Principal Problem:   Intractable nausea and vomiting Active Problems:   Uncontrolled type 1 diabetes mellitus (HCC)   Asthma   Sinus tachycardia   Hypomagnesemia   Hypokalemia   Dehydration   Abdominal pain   Hypertension   Gastroparesis   GERD (gastroesophageal reflux disease)   Thrombocytosis (HCC)  Nausea/Vomiting/Abdominal Pain in the setting of Diabetic Gastroparesis, improved  -Was NPO as patient's symptoms worsened however she states her abdominal pain has improved so increased diet from FULL LIQUIDS to Carb Modified and is tolerating that well; Has had multiple admissions for the same  -C/w Promethazine 25 mg po q6h -D/C'd Metoclopramide 10 mg IV q6h per GI Reccs and will D/C Home Metoclopramide -C/w Erythromycin 250 mg po TIDwm -C/w Home dose of Pantoprazole 40 mg po BID -C/w Strict Glucose Control -Avoid Narcotics as much as possible as it will slow down the Intestinal Tract;  -Gastroenterology Consulted for further Recc's -Pain control with IV Acetaminophen 1000 mg q6hprn and with Ketolorlac 15 mg IV q8hprn for Moderate Pain while hospitalized  -Lipase normal at 17.   -GI Recommended Gastric Emptying Study and it showed a significantly delayed gastric emptying study. -If patient worsens will repeat Abd/Pelvis and may need repeat EGD -Diet Advanced and tolerated well -Follow up with Gastroenterology as an outpatient  Diabetic Ketoacidosis without coma associated with Type 1 diabetes mellitus,  improved -Patient has had multiple admissions in the last 6 months -Bicarb is now 45; AG was 4 -Diabetic Chemical engineer and Appreciated Recc's -Changed Lantus 15 unts sq BID to Lantus 12 units sq qHS and changed Moderate Novolog SSI q4h to Sensitive Novolog SSI AC/HS as patient was Hypoglycemic the night prior -Resume Home Lantus Dose and Novolog SSI 1 unit for 10 carbs eatem -CBG's ranging from 96-248; -She received D50 x 2 Ampule's then night prior -C/w Glucagon at D/C -Advanced Diet slowly and now is on Carb Modified Diet -Keep sugars below 200 as an outpatient  -Gastric Emptying study as above -Continue to Monitor BS as an outpatient  -Patient will need to follow up with Endocrinology at D/C  Sinus Tachycardia, improved -Likely secondary to dehydration  -This has been happening in the past; improvement with proper hydration. -Restarted IVF Rehydration at 100 mL/hr and now decreased to 50 mL/hr; Remain Adequately Hydrated -TSH was normal in October at 2.005; Will need repeat as an outpatient  -C/w Metoprolol 12.5 mg po BID -Telemetry with no changes; C/w Telemetry Monitoring   Essential Hypertension -Blood pressure was at Goal and was 112/65 -Continue Metoprolol 12.5 mg po BID -Had Hydralazine 5 mg IV q4hprn for SBP >160 or DBP >100 while hospitalized   Leukocytosis, improved -Was likely reactive to DKA -Improved and went from 15.2 -> 25.0 ->7.9-> 8.9 -> 7.2 -> 5.3 -> 5.5 -Repeat CBC as an outpatient and Continue to monitor for S/Sx of Infection  Moderate Persistent Asthma -Remains stable  -Not in Exacerbation -C/w Home Symbicort and Albuterol Nebs and and Albuterol Inhaler  Thrombocytosis  -Platelet Count went from 331 -> 463 -> 400 -> 357 -> 367 -Likely Reactive -Continue to Monitor and Repeat CBC as an outpatient   GERD -C/w po Pantoprazole 40 mg po BID; -Patient no longer taking po Famotidine   Hypokalemia -Patient's K+ Level was 3.9  this AM -Continue to Monitor and Replete as Necessary -Repeat CMP as an outpatient   Hypomagnesemia -Patient's Mag Level this AM was 1.6 -Replete with IV Mag Sulfate 2 grams -Continue to Monitor and Replete as Necessary -Repeat Mag level as an outpatient   Diarrhea/Loose Stools, improved -Not on Any stool softeners -Possibly from Erythromycin -Low Suspicion for C Difficile as patient does not have a temperature and WBC is not elevated -Asked Nursing to put a Hat in the toilet -Given Loperamide 2 mg po as needed for diarrhea/loose stools and symptoms improved -Continue to Monitor as an outpatient   Discharge Instructions  Discharge Instructions    Ambulatory referral to Nutrition and Diabetic Education    Complete by:  As directed    Call MD for:  difficulty breathing, headache or visual disturbances    Complete by:  As directed    Call MD for:  extreme fatigue    Complete by:  As directed    Call MD for:  persistant dizziness or light-headedness    Complete by:  As directed    Call MD for:  persistant nausea and vomiting    Complete by:  As directed    Call MD for:  severe uncontrolled pain    Complete by:  As directed    Call MD for:  temperature >100.4    Complete by:  As directed    Diet - low sodium heart healthy    Complete by:  As directed    Discharge instructions  Complete by:  As directed    Follow up with PCP, Endocrinology, and Gastroenterology as an outpatient. Take all medications as prescribed. If symptoms change or worsen please return to the ED for evaluation.   Increase activity slowly    Complete by:  As directed      Allergies as of 07/11/2016      Reactions   Peanut-containing Drug Products Swelling, Other (See Comments)   Reaction:  Swelling of mouth and lips    Food Swelling, Other (See Comments)   Pt is allergic to strawberries.   Reaction:  Swelling of mouth and lips    Ultram [tramadol] Itching      Medication List    STOP taking  these medications   famotidine 20 MG tablet Commonly known as:  PEPCID   metoCLOPramide 10 MG tablet Commonly known as:  REGLAN     TAKE these medications   albuterol 108 (90 Base) MCG/ACT inhaler Commonly known as:  PROVENTIL HFA;VENTOLIN HFA Inhale 2 puffs into the lungs every 6 (six) hours as needed for wheezing or shortness of breath.   albuterol (2.5 MG/3ML) 0.083% nebulizer solution Commonly known as:  PROVENTIL Take 2.5 mg by nebulization every 6 (six) hours as needed for wheezing or shortness of breath.   budesonide-formoterol 160-4.5 MCG/ACT inhaler Commonly known as:  SYMBICORT Inhale 2 puffs into the lungs 2 (two) times daily.   erythromycin 250 MG tablet Commonly known as:  E-MYCIN Take 1 tablet (250 mg total) by mouth 3 (three) times daily with meals.   glucagon 1 MG injection Commonly known as:  GLUCAGON EMERGENCY Inject 1 mg into the vein once as needed.   insulin glargine 100 UNIT/ML injection Commonly known as:  LANTUS Inject 0.22 mLs (22 Units total) into the skin daily.   metoprolol tartrate 25 MG tablet Commonly known as:  LOPRESSOR Take 0.5 tablets (12.5 mg total) by mouth 2 (two) times daily.   NOVOLOG FLEXPEN 100 UNIT/ML FlexPen Generic drug:  insulin aspart Inject into the skin See admin instructions. Pt uses 1 unit for every 10 grams of carbs with meals and snacks for BS greater than 50.   pantoprazole 40 MG tablet Commonly known as:  PROTONIX Take 1 tablet (40 mg total) by mouth 2 (two) times daily.   promethazine 25 MG tablet Commonly known as:  PHENERGAN Take 1 tablet (25 mg total) by mouth every 6 (six) hours as needed for nausea or vomiting.   Vitamin D (Ergocalciferol) 50000 units Caps capsule Commonly known as:  DRISDOL Take 50,000 Units by mouth once a week.      Follow-up Information    Vicenta Aly, [place].   Specialty:  Nurse Practitioner Contact information: [place]  [id number] [phone]          Allergies  Allergen Reactions  . Peanut-Containing Drug Products Swelling and Other (See Comments)    Reaction:  Swelling of mouth and lips   . 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   Reaction:  Swelling of mouth and lips   . Ultram [Tramadol] Itching    Consultations:  Gastroenterology Dr. Clarene Essex  Procedures/Studies: Nm Gastric Emptying  Result Date: 07/09/2016 CLINICAL DATA:  Nausea and vomiting, abdominal pain for 2 years, diabetes mellitus EXAM: NUCLEAR MEDICINE GASTRIC EMPTYING SCAN TECHNIQUE: After oral ingestion of radiolabeled meal, sequential abdominal images were obtained for 4 hours. Percentage of activity emptying the stomach was calculated  at 1 hour, 2 hour, 3 hour, and 4 hours. RADIOPHARMACEUTICALS:  2.2 mCi Tc-31m sulfur colloid in standardized meal COMPARISON:  None FINDINGS: Expected location of the stomach in the left upper quadrant. Ingested meal empties the stomach poorly over the course of the study. 2% emptied at 1 hr ( normal >= 10%) 16% emptied at 2 hr ( normal >= 40%) 20% emptied at 3 hr ( normal >= 70%) 24% emptied at 4 hr ( normal >= 90%) IMPRESSION: Significantly delayed gastric emptying study. Electronically Signed   By: Lavonia Dana M.D.   On: 07/09/2016 14:23   Dg Abd 2 Views  Result Date: 06/24/2016 CLINICAL DATA:  26 year old presenting with acute onset of abdominal pain and nausea and vomiting. Current history of poorly controlled diabetes with prior episodes of diabetic ketoacidosis. EXAM: ABDOMEN - 2 VIEW COMPARISON:  Abdominal x-rays 06/02/2016, 05/31/2016 and earlier. CT abdomen pelvis 02/20/2016 and earlier. Chest x-rays 05/31/2016, 01/17/2016 and earlier. FINDINGS: Bowel gas pattern unremarkable without evidence of obstruction or significant ileus. No evidence of free air or significant air-fluid levels on the erect image. Expected stool burden in the colon. Surgical clips in the  right upper quadrant from prior cholecystectomy. Phleboliths low on the left side of the pelvis as noted previously. No opaque urinary tract calculi. Regional skeleton intact. IMPRESSION: No acute abdominal abnormality. Electronically Signed   By: Evangeline Dakin M.D.   On: 06/24/2016 15:36     Subjective: Seen and examined and was doing well and tolerated diet. No nausea or vomiting. Had no Abdominal Pain. Ready to go home.  Discharge Exam: Vitals:   07/10/16 2032 07/11/16 0439  BP: 114/79 112/65  Pulse: (!) 105 100  Resp: 18 18  Temp: 98.3 F (36.8 C) 98.7 F (37.1 C)   Vitals:   07/10/16 0924 07/10/16 1300 07/10/16 2032 07/11/16 0439  BP: 119/80 110/79 114/79 112/65  Pulse: 98 88 (!) 105 100  Resp:  18 18 18   Temp:  98.7 F (37.1 C) 98.3 F (36.8 C) 98.7 F (37.1 C)  TempSrc:  Oral Oral Oral  SpO2:  100% 100% 100%  Weight:      Height:       General: Pt is alert, awake, not in acute distress Cardiovascular: Slightly tachycardic, S1/S2 +, no rubs, no gallops Respiratory: CTA bilaterally, no wheezing, no rhonchi Abdominal: Soft, NT, ND, bowel sounds + Extremities: no edema, no cyanosis  The results of significant diagnostics from this hospitalization (including imaging, microbiology, ancillary and laboratory) are listed below for reference.    Microbiology: Recent Results (from the past 240 hour(s))  Urine Culture     Status: Abnormal   Collection Time: 07/05/16 10:00 AM  Result Value Ref Range Status   Specimen Description URINE, CLEAN CATCH  Final   Special Requests NONE  Final   Culture MULTIPLE SPECIES PRESENT, SUGGEST RECOLLECTION (A)  Final   Report Status 07/07/2016 FINAL  Final    Labs: BNP (last 3 results) No results for input(s): BNP in the last 8760 hours. Basic Metabolic Panel:  Recent Labs Lab 07/07/16 0705 07/08/16 0611 07/08/16 1008 07/09/16 0543 07/10/16 0525 07/11/16 0536  NA 138 141  --  141 139 137  K 4.5 3.5  --  3.2* 3.4* 3.9  CL  110 107  --  111 110 107  CO2 20* 22  --  24 26 26   GLUCOSE 170* 169*  --  87 179* 260*  BUN 9 11  --  14 10  5*  CREATININE 0.45 0.58  --  0.61 0.45 0.43*  CALCIUM 8.6* 9.4  --  8.6* 8.2* 8.4*  MG  --   --  1.7 1.5* 1.6* 1.6*  PHOS  --   --  4.2 4.0 3.6 3.3   Liver Function Tests:  Recent Labs Lab 07/05/16 1000 07/09/16 0543 07/10/16 0525 07/11/16 0536  AST 16 14* 20 16  ALT 11* 10* 12* 12*  ALKPHOS 96 72 73 77  BILITOT 0.7 0.5 0.5 0.4  PROT 7.9 5.8* 5.3* 5.3*  ALBUMIN 4.4 3.2* 2.9* 3.0*    Recent Labs Lab 07/05/16 1000  LIPASE 17   No results for input(s): AMMONIA in the last 168 hours. CBC:  Recent Labs Lab 07/07/16 0705 07/08/16 1008 07/09/16 0543 07/10/16 0525 07/11/16 0536  WBC 7.9 8.9 7.2 5.3 5.5  NEUTROABS 5.1 6.2 4.2 2.6 2.8  HGB 11.8* 13.2 11.0* 10.9* 9.8*  HCT 34.7* 39.5 33.2* 33.2* 29.3*  MCV 87.6 88.2 86.5 88.3 87.5  PLT 331 463* 400 357 367   Cardiac Enzymes: No results for input(s): CKTOTAL, CKMB, CKMBINDEX, TROPONINI in the last 168 hours. BNP: Invalid input(s): POCBNP CBG:  Recent Labs Lab 07/10/16 1714 07/10/16 2028 07/10/16 2332 07/11/16 0416 07/11/16 0745  GLUCAP 96 200* 244* 248* 215*   D-Dimer No results for input(s): DDIMER in the last 72 hours. Hgb A1c No results for input(s): HGBA1C in the last 72 hours. Lipid Profile No results for input(s): CHOL, HDL, LDLCALC, TRIG, CHOLHDL, LDLDIRECT in the last 72 hours. Thyroid function studies No results for input(s): TSH, T4TOTAL, T3FREE, THYROIDAB in the last 72 hours.  Invalid input(s): FREET3 Anemia work up No results for input(s): VITAMINB12, FOLATE, FERRITIN, TIBC, IRON, RETICCTPCT in the last 72 hours. Urinalysis    Component Value Date/Time   COLORURINE YELLOW 07/05/2016 1000   APPEARANCEUR CLOUDY (A) 07/05/2016 1000   LABSPEC 1.015 07/05/2016 1000   PHURINE 6.0 07/05/2016 1000   GLUCOSEU >=500 (A) 07/05/2016 1000   GLUCOSEU >=1000 11/07/2012 1205   HGBUR TRACE  (A) 07/05/2016 1000   BILIRUBINUR NEGATIVE 07/05/2016 1000   KETONESUR >80 (A) 07/05/2016 1000   PROTEINUR TRACE (A) 07/05/2016 1000   UROBILINOGEN 0.2 11/24/2014 1045   NITRITE NEGATIVE 07/05/2016 1000   LEUKOCYTESUR SMALL (A) 07/05/2016 1000   Sepsis Labs Invalid input(s): PROCALCITONIN,  WBC,  LACTICIDVEN Microbiology Recent Results (from the past 240 hour(s))  Urine Culture     Status: Abnormal   Collection Time: 07/05/16 10:00 AM  Result Value Ref Range Status   Specimen Description URINE, CLEAN CATCH  Final   Special Requests NONE  Final   Culture MULTIPLE SPECIES PRESENT, SUGGEST RECOLLECTION (A)  Final   Report Status 07/07/2016 FINAL  Final   Time coordinating discharge: 35 minutes  SIGNED:  Kerney Elbe, DO [facility] 07/11/2016, 11:52 AM Pager [phone]  If 7PM-7AM, please contact night-coverage [email or web address] Password [email or web address]

## 2016-07-11 NOTE — Progress Notes (Signed)
Pt discharged. Upon leaving, pt noted alert and oriented, respirations even and unlabored with steady gait. She denies having any questions in regards to discharge instructions. Pt ambulated to elevator upon leaving, she is being drove home by family member.

## 2016-07-13 ENCOUNTER — Emergency Department (HOSPITAL_COMMUNITY)
Admission: EM | Admit: 2016-07-13 | Discharge: 2016-07-13 | Disposition: A | Discharge status: Home / Self Care | Payer: Medicaid Other | Attending: Emergency Medicine | Admitting: Emergency Medicine

## 2016-07-13 ENCOUNTER — Encounter (HOSPITAL_COMMUNITY): Payer: Self-pay | Admitting: Emergency Medicine

## 2016-07-13 DIAGNOSIS — R1084 Generalized abdominal pain: Secondary | ICD-10-CM | POA: Diagnosis present

## 2016-07-13 DIAGNOSIS — E119 Type 2 diabetes mellitus without complications: Secondary | ICD-10-CM | POA: Diagnosis not present

## 2016-07-13 DIAGNOSIS — I110 Hypertensive heart disease with heart failure: Secondary | ICD-10-CM | POA: Diagnosis not present

## 2016-07-13 DIAGNOSIS — J45909 Unspecified asthma, uncomplicated: Secondary | ICD-10-CM | POA: Diagnosis not present

## 2016-07-13 DIAGNOSIS — R112 Nausea with vomiting, unspecified: Secondary | ICD-10-CM | POA: Insufficient documentation

## 2016-07-13 DIAGNOSIS — Z794 Long term (current) use of insulin: Secondary | ICD-10-CM | POA: Insufficient documentation

## 2016-07-13 DIAGNOSIS — Z79899 Other long term (current) drug therapy: Secondary | ICD-10-CM | POA: Diagnosis not present

## 2016-07-13 DIAGNOSIS — I5032 Chronic diastolic (congestive) heart failure: Secondary | ICD-10-CM | POA: Insufficient documentation

## 2016-07-13 LAB — COMPREHENSIVE METABOLIC PANEL
ALK PHOS: 113 U/L (ref 38–126)
ALT: 12 U/L — AB (ref 14–54)
ANION GAP: 17 — AB (ref 5–15)
AST: 20 U/L (ref 15–41)
Albumin: 4.8 g/dL (ref 3.5–5.0)
BILIRUBIN TOTAL: 0.7 mg/dL (ref 0.3–1.2)
BUN: 13 mg/dL (ref 6–20)
CALCIUM: 10 mg/dL (ref 8.9–10.3)
CO2: 22 mmol/L (ref 22–32)
CREATININE: 0.66 mg/dL (ref 0.44–1.00)
Chloride: 97 mmol/L — ABNORMAL LOW (ref 101–111)
Glucose, Bld: 355 mg/dL — ABNORMAL HIGH (ref 65–99)
Potassium: 3.8 mmol/L (ref 3.5–5.1)
SODIUM: 136 mmol/L (ref 135–145)
TOTAL PROTEIN: 8.5 g/dL — AB (ref 6.5–8.1)

## 2016-07-13 LAB — I-STAT CHEM 8, ED
BUN: 11 mg/dL (ref 6–20)
CHLORIDE: 105 mmol/L (ref 101–111)
CREATININE: 0.3 mg/dL — AB (ref 0.44–1.00)
Calcium, Ion: 1.18 mmol/L (ref 1.15–1.40)
GLUCOSE: 233 mg/dL — AB (ref 65–99)
HCT: 37 % (ref 36.0–46.0)
Hemoglobin: 12.6 g/dL (ref 12.0–15.0)
POTASSIUM: 4.8 mmol/L (ref 3.5–5.1)
Sodium: 138 mmol/L (ref 135–145)
TCO2: 22 mmol/L (ref 0–100)

## 2016-07-13 LAB — URINALYSIS, ROUTINE W REFLEX MICROSCOPIC
BILIRUBIN URINE: NEGATIVE
Hgb urine dipstick: NEGATIVE
KETONES UR: 80 mg/dL — AB
NITRITE: NEGATIVE
PH: 6 (ref 5.0–8.0)
Protein, ur: NEGATIVE mg/dL
SPECIFIC GRAVITY, URINE: 1.024 (ref 1.005–1.030)

## 2016-07-13 LAB — CBC
HCT: 41.5 % (ref 36.0–46.0)
HEMOGLOBIN: 14.1 g/dL (ref 12.0–15.0)
MCH: 29.6 pg (ref 26.0–34.0)
MCHC: 34 g/dL (ref 30.0–36.0)
MCV: 87.2 fL (ref 78.0–100.0)
PLATELETS: 538 10*3/uL — AB (ref 150–400)
RBC: 4.76 MIL/uL (ref 3.87–5.11)
RDW: 13.7 % (ref 11.5–15.5)
WBC: 9.2 10*3/uL (ref 4.0–10.5)

## 2016-07-13 LAB — I-STAT BETA HCG BLOOD, ED (MC, WL, AP ONLY)

## 2016-07-13 LAB — RAPID URINE DRUG SCREEN, HOSP PERFORMED
AMPHETAMINES: NOT DETECTED
BENZODIAZEPINES: NOT DETECTED
Barbiturates: NOT DETECTED
COCAINE: NOT DETECTED
Opiates: NOT DETECTED
Tetrahydrocannabinol: NOT DETECTED

## 2016-07-13 LAB — CBG MONITORING, ED: Glucose-Capillary: 244 mg/dL — ABNORMAL HIGH (ref 65–99)

## 2016-07-13 LAB — LIPASE, BLOOD: Lipase: <10 U/L — ABNORMAL LOW (ref 11–51)

## 2016-07-13 MED ORDER — METOPROLOL TARTRATE 5 MG/5ML IV SOLN
5.0000 mg | Freq: Once | INTRAVENOUS | Status: AC
  Administered 2016-07-13: 5 mg via INTRAVENOUS
  Filled 2016-07-13: qty 5

## 2016-07-13 MED ORDER — INSULIN GLARGINE 100 UNIT/ML ~~LOC~~ SOLN
10.0000 [IU] | Freq: Once | SUBCUTANEOUS | Status: AC
  Administered 2016-07-13: 10 [IU] via SUBCUTANEOUS
  Filled 2016-07-13: qty 0.1

## 2016-07-13 MED ORDER — KETOROLAC TROMETHAMINE 30 MG/ML IJ SOLN
30.0000 mg | Freq: Once | INTRAMUSCULAR | Status: AC
  Administered 2016-07-13: 30 mg via INTRAVENOUS
  Filled 2016-07-13: qty 1

## 2016-07-13 MED ORDER — ONDANSETRON HCL 4 MG/2ML IJ SOLN
4.0000 mg | Freq: Once | INTRAMUSCULAR | Status: AC
  Administered 2016-07-13: 4 mg via INTRAVENOUS
  Filled 2016-07-13: qty 2

## 2016-07-13 MED ORDER — METOCLOPRAMIDE HCL 5 MG/ML IJ SOLN
10.0000 mg | Freq: Once | INTRAMUSCULAR | Status: AC
Start: 2016-07-13 — End: 2016-07-13
  Administered 2016-07-13: 10 mg via INTRAVENOUS
  Filled 2016-07-13: qty 2

## 2016-07-13 MED ORDER — ONDANSETRON HCL 4 MG PO TABS: 4.0000 mg | ORAL_TABLET | Freq: Four times a day (QID) | ORAL | 0 refills | Status: DC

## 2016-07-13 MED ORDER — SODIUM CHLORIDE 0.9 % IV BOLUS (SEPSIS)
1000.0000 mL | Freq: Once | INTRAVENOUS | Status: AC
  Administered 2016-07-13: 1000 mL via INTRAVENOUS

## 2016-07-13 MED ORDER — DICYCLOMINE HCL 20 MG PO TABS: 20.0000 mg | ORAL_TABLET | Freq: Two times a day (BID) | ORAL | 0 refills | Status: DC

## 2016-07-13 MED ORDER — HALOPERIDOL LACTATE 5 MG/ML IJ SOLN
2.0000 mg | Freq: Once | INTRAMUSCULAR | Status: AC
  Administered 2016-07-13: 2 mg via INTRAVENOUS
  Filled 2016-07-13: qty 1

## 2016-07-13 MED ORDER — PROMETHAZINE HCL 25 MG/ML IJ SOLN
25.0000 mg | Freq: Once | INTRAMUSCULAR | Status: AC
  Administered 2016-07-13: 25 mg via INTRAVENOUS
  Filled 2016-07-13: qty 1

## 2016-07-13 MED ORDER — HALOPERIDOL LACTATE 5 MG/ML IJ SOLN
3.0000 mg | Freq: Once | INTRAMUSCULAR | Status: AC
  Administered 2016-07-13: 3 mg via INTRAVENOUS
  Filled 2016-07-13: qty 1

## 2016-07-13 MED ORDER — DIPHENHYDRAMINE HCL 50 MG/ML IJ SOLN
25.0000 mg | Freq: Once | INTRAMUSCULAR | Status: AC
  Administered 2016-07-13: 25 mg via INTRAVENOUS
  Filled 2016-07-13: qty 1

## 2016-07-13 NOTE — ED Triage Notes (Addendum)
Pt from home with c/o generalized abdominal pain secondary to gastroparesis. Pt states pain began 1 hour ago. Pt has hx of DM and her sugar was 125 PTA

## 2016-07-13 NOTE — ED Notes (Signed)
Writer asked pt if she wanted to have some fluids orally, pt just responded by looking at [employer].

## 2016-07-13 NOTE — Discharge Instructions (Signed)
Take medications as directed for pain and nausea.  Follow-up with your primary care doctor next 24-48 hours for further evaluation.  Take medications as directed.  Make sure you're staying hydrated drinking plenty of fluids.  Return the emergency room for any worsening pain, fever, persistent nausea/vomiting or any other worsening or concerning symptoms.

## 2016-07-13 NOTE — ED Notes (Signed)
ED Provider at bedside. 

## 2016-07-13 NOTE — ED Notes (Signed)
Patient again stated that she did not need to void.

## 2016-07-13 NOTE — ED Provider Notes (Signed)
[facility] DEPT Provider Note   CSN: [id number] Arrival date & time: 07/13/16  1329     History   Chief Complaint Chief Complaint  Patient presents with  . Abdominal Pain    HPI [name] is a 26 y.o. female with PMH/o Anxiety, DM, Gastroparesis.who presents with generalized abdominal pain that began at 11am this morning. Patient states that current symptoms are consistent with her prior episodes of gastroparesis. She has not tried any medication for the pain. She had a similar episode yesterday but states that it only lasted an hour or so before resolving. She reports associated nausea/vomiting. She denies any other alleviating or aggravating factors. Patient was recently discharged from the hospital on 07/11/16 after an admission for gastroparesis. She had a GI consult during her admission and was started on Erythromycin which she states she has been taking. She denies any fever, CP, SOB, dysuria, hematuria.   The history is provided by the patient.    Past Medical History:  Diagnosis Date  . Anxiety   . Arthritis   . Asthma   . Diabetes mellitus 2007   IDDM.  poorly controlled, multiple admits with DKA  . Gallstones   . Gastroparesis   . Heart murmur   . Hepatic steatosis 11/26/2014   and hepatomegaly  . Hypertension    NOT CURRENTLY ON ANY BP MED  . Liver mass 11/26/2014  . Pancreatitis, acute 11/26/2014    Patient Active Problem List   Diagnosis Date Noted  . Thrombocytosis ([place]) 07/08/2016  . Candiduria, asymptomatic 06/23/2016  . Hyponatremia 06/13/2016  . Hematuria 05/13/2016  . UTI (urinary tract infection) 01/22/2016  . Diarrhea 11/09/2015  . Acute urinary retention   . GERD (gastroesophageal reflux disease) 07/10/2015  . Depression with anxiety 07/10/2015  . Gastroparesis 06/22/2015  . Altered mental status 06/22/2015  . Volume depletion 06/10/2015  . Protein-calorie malnutrition, severe 06/10/2015  . Hyperglycemia   . Elevated bilirubin   .  Hematemesis with nausea   . Intractable nausea and vomiting 05/28/2015  . Abdominal pain in female   . Abdominal pain 05/24/2015  . Hypertension 05/24/2015  . Dehydration   . Chronic diastolic heart failure ([place]) 04/11/2015  . Hematemesis 04/08/2015  . DKA (diabetic ketoacidoses) ([place]) 03/22/2015  . S/P laparoscopic cholecystectomy 02/11/2015  . Postextubation stridor   . Pancreatitis, acute 11/26/2014  . Volume overload 11/26/2014  . Hypokalemia 11/26/2014  . Hepatic steatosis 11/26/2014  . Liver mass 11/26/2014  . Sepsis ([place]) 11/25/2014  . Sinus tachycardia 11/25/2014  . Hypomagnesemia 11/25/2014  . Hypophosphatemia 11/25/2014  . Elevated LFTs 11/24/2014  . Acute kidney injury ([place]) 11/24/2014  . Migraine headache 11/24/2014  . Asthma 06/29/2012  . Uncontrolled type 1 diabetes mellitus ([place]) 06/19/2010  . Goiter, unspecified 06/19/2010    Past Surgical History:  Procedure Laterality Date  . CHOLECYSTECTOMY N/A 02/11/2015   Procedure: LAPAROSCOPIC CHOLECYSTECTOMY WITH INTRAOPERATIVE CHOLANGIOGRAM;  Surgeon: Greer Pickerel, MD;  Location: [facility] ORS;  Service: General;  Laterality: N/A;  . ESOPHAGOGASTRODUODENOSCOPY (EGD) WITH PROPOFOL Left 09/20/2014   Procedure: ESOPHAGOGASTRODUODENOSCOPY (EGD) WITH PROPOFOL;  Surgeon: Arta Silence, MD;  Location: [facility] ENDOSCOPY;  Service: Endoscopy;  Laterality: Left;  . WISDOM TOOTH EXTRACTION      OB History    Gravida Para Term Preterm AB Living   2 1 0 1 1 1    SAB TAB Ectopic Multiple Live Births   0 1 0 0 1       Home Medications    Prior to  Admission medications   Medication Sig Start Date End Date Taking? Authorizing Provider  albuterol (PROVENTIL HFA;VENTOLIN HFA) 108 (90 BASE) MCG/ACT inhaler Inhale 2 puffs into the lungs every 6 (six) hours as needed for wheezing or shortness of breath.    Yes [provider]  albuterol (PROVENTIL) (2.5 MG/3ML) 0.083% nebulizer solution Take 2.5 mg by nebulization every 6 (six) hours as  needed for wheezing or shortness of breath.    Yes [provider]  budesonide-formoterol (SYMBICORT) 160-4.5 MCG/ACT inhaler Inhale 2 puffs into the lungs 2 (two) times daily.    Yes [provider]  erythromycin (E-MYCIN) 250 MG tablet Take 1 tablet (250 mg total) by mouth 3 (three) times daily with meals. 07/11/16  Yes Sheikh, Omair Latif, DO  glucagon (GLUCAGON EMERGENCY) 1 MG injection Inject 1 mg into the vein once as needed. 06/14/16  Yes Sheikh, Omair Latif, DO  insulin aspart (NOVOLOG FLEXPEN) 100 UNIT/ML FlexPen Inject into the skin See admin instructions. Pt uses 1 unit for every 10 grams of carbs with meals and snacks for BS greater than 50.   Yes [provider]  insulin glargine (LANTUS) 100 UNIT/ML injection Inject 0.22 mLs (22 Units total) into the skin daily. 06/28/16  Yes Hosie Poisson, MD  metoprolol tartrate (LOPRESSOR) 25 MG tablet Take 0.5 tablets (12.5 mg total) by mouth 2 (two) times daily. 05/10/16  Yes Mariel Aloe, MD  pantoprazole (PROTONIX) 40 MG tablet Take 1 tablet (40 mg total) by mouth 2 (two) times daily. 02/25/16  Yes Regalado, Belkys A, MD  promethazine (PHENERGAN) 25 MG tablet Take 1 tablet (25 mg total) by mouth every 6 (six) hours as needed for nausea or vomiting. 06/15/16  Yes Deno Etienne, DO  Vitamin D, Ergocalciferol, (DRISDOL) 50000 units CAPS capsule Take 50,000 Units by mouth once a week. 05/26/16  Yes [provider]  dicyclomine (BENTYL) 20 MG tablet Take 1 tablet (20 mg total) by mouth 2 (two) times daily. 07/13/16   Volanda Napoleon, PA-C  ondansetron (ZOFRAN) 4 MG tablet Take 1 tablet (4 mg total) by mouth every 6 (six) hours. 07/13/16   Volanda Napoleon, PA-C    Family History Family History  Problem Relation Age of Onset  . Heart disease Maternal Grandmother   . Heart disease Maternal Grandfather   . Diabetes Mother   . Hyperlipidemia Mother   . Hypertension Father   . Heart disease Father   . Hypertension  Paternal Grandmother   . Cancer Paternal Grandfather     Social History Social History  Substance Use Topics  . Smoking status: Never Smoker  . Smokeless tobacco: Never Used  . Alcohol use No     Allergies   Peanut-containing drug products; Food; and Ultram [tramadol]   Review of Systems Review of Systems  Constitutional: Negative for fever.  Respiratory: Negative for shortness of breath.   Cardiovascular: Negative for chest pain.  Gastrointestinal: Positive for abdominal pain, nausea and vomiting.  Genitourinary: Negative for dysuria and hematuria.     Physical Exam Updated Vital Signs BP (!) 142/85   Pulse (!) 129   Temp 98.9 F (37.2 C) (Oral)   Resp (!) 22   Ht 5\' 4"  (1.626 m)   Wt 52.2 kg (115 lb)   LMP 06/02/2016   SpO2 100%   BMI 19.74 kg/m   Physical Exam  Constitutional: She is oriented to person, place, and time. She appears well-developed and well-nourished.  Continuously moving in the bed during  examination  HENT:  Head: Normocephalic and atraumatic.  Mouth/Throat: Oropharynx is clear and moist and mucous membranes are normal.  Eyes: Conjunctivae, EOM and lids are normal. Pupils are equal, round, and reactive to light.  Neck: Full passive range of motion without pain.  Cardiovascular: Regular rhythm, normal heart sounds and normal pulses.  Tachycardia present.  Exam reveals no gallop and no friction rub.   No murmur heard. Pulmonary/Chest: Effort normal.  Abdominal: Soft. Normal appearance and bowel sounds are normal. There is generalized tenderness. There is no rigidity, no guarding, no tenderness at McBurney's point and negative Murphy's sign.  Musculoskeletal: Normal range of motion.  Neurological: She is alert and oriented to person, place, and time.  Skin: Skin is warm and dry. Capillary refill takes less than 2 seconds.  Psychiatric: She has a normal mood and affect. Her speech is normal.  Nursing note and vitals reviewed.    ED  Treatments / Results  Labs (all labs ordered are listed, but only abnormal results are displayed) Labs Reviewed  LIPASE, BLOOD - Abnormal; Notable for the following:       Result Value   Lipase <10 (*)    All other components within normal limits  COMPREHENSIVE METABOLIC PANEL - Abnormal; Notable for the following:    Chloride 97 (*)    Glucose, Bld 355 (*)    Total Protein 8.5 (*)    ALT 12 (*)    Anion gap 17 (*)    All other components within normal limits  CBC - Abnormal; Notable for the following:    Platelets 538 (*)    All other components within normal limits  URINALYSIS, ROUTINE W REFLEX MICROSCOPIC - Abnormal; Notable for the following:    Color, Urine STRAW (*)    Glucose, UA >=500 (*)    Ketones, ur 80 (*)    Leukocytes, UA TRACE (*)    Bacteria, UA RARE (*)    Squamous Epithelial / LPF 0-5 (*)    All other components within normal limits  CBG MONITORING, ED - Abnormal; Notable for the following:    Glucose-Capillary 244 (*)    All other components within normal limits  I-STAT CHEM 8, ED - Abnormal; Notable for the following:    Creatinine, Ser 0.30 (*)    Glucose, Bld 233 (*)    All other components within normal limits  RAPID URINE DRUG SCREEN, HOSP PERFORMED  I-STAT BETA HCG BLOOD, ED ([facility], [facility], [facility] ONLY)    EKG  EKG Interpretation None       Radiology No results found.  Procedures Procedures (including critical care time)  Medications Ordered in ED Medications  metoCLOPramide (REGLAN) injection 10 mg (10 mg Intravenous Given 07/13/16 1557)  ondansetron (ZOFRAN) injection 4 mg (4 mg Intravenous Given 07/13/16 1556)  sodium chloride 0.9 % bolus 1,000 mL (0 mLs Intravenous Stopped 07/13/16 1709)  haloperidol lactate (HALDOL) injection 2 mg (2 mg Intravenous Given 07/13/16 1627)  diphenhydrAMINE (BENADRYL) injection 25 mg (25 mg Intravenous Given 07/13/16 1626)  sodium chloride 0.9 % bolus 1,000 mL (0 mLs Intravenous Stopped 07/13/16 2114)  haloperidol  lactate (HALDOL) injection 3 mg (3 mg Intravenous Given 07/13/16 1757)  sodium chloride 0.9 % bolus 1,000 mL (0 mLs Intravenous Stopped 07/13/16 2130)  metoprolol tartrate (LOPRESSOR) injection 5 mg (5 mg Intravenous Given 07/13/16 1916)  insulin glargine (LANTUS) injection 10 Units (10 Units Subcutaneous Given 07/13/16 2008)  ketorolac (TORADOL) 30 MG/ML injection 30 mg (30 mg Intravenous Given 07/13/16  2128)  promethazine (PHENERGAN) injection 25 mg (25 mg Intravenous Given 07/13/16 2237)     Initial Impression / Assessment and Plan / ED Course  I have reviewed the triage vital signs and the nursing notes.  Pertinent labs & imaging results that were available during my care of the patient were reviewed by me and considered in my medical decision making (see chart for details).     27 yo F with generalized abdominal pain consistent with past episodes of gastroparesis. Recently discharged from the hospital on 07/11/16. Patient is afebrile, non-toxic appearing, sitting comfortably on examination table. Vital signs reviewed. Patient is slightly tachycardic and hypertensive. Likely secondary to pain patient moving around on the exam table. Will give analgesics and reassess History/physical exam appear to be consistent with gastroparesis. History/physical exam are not concerning for appendicitis, diverticulitis, SBO. Will give IVF for fluid resuscitation. Labs ordered at triage including CBC, CMP, UA, UDS, beta hcg. Will give IVF for fluid resuscitation. Antiemetics given in the department.   Patient still reporting pain and nausea. Asking for morphine or dilaudid "since that's what always works." Explained to her that we will try alternative medications. Patient given Haldol and Benadryl.   Labs reviewed. CBC with no elevation in WBC. Otherwise unremarkable. Initial CBG 244. I stat beta negative. Lipase unremarkable. CMP with no elevation in BUN/Creatinine. Otherwise unremarkable. Anion gap is 17, though  given patient's blood glucose levels do not suspect DKA. UA pending. Will give additional IVF.   Repeat vitals improved after fluids. UDS is negative.  UA reviewed. Small amount of ketones present. Ketones likely a result of dehydration.  Repeat BG is 355. Will give additional fluid resuscitation and SQ insulin. Patient asking for more pain medication. Additional analgesics ordered. Will repeat BMP for evaluation after fluid resuscitaiton.   Re-evaluation: Vitals have improved. Patient reports improvement in pain but state she is still feeling nauseous. Will give additional antiemetics and will PO challenge patient in the department.   Patient able to tolerate liquids in the department. Patient reports improvement in symptoms and states she feels like she can manage at home. Repeat Chem 8 shows improvement. Anion gap is now 11. Discussed with patient. After last round of antiemetics, patient has not been nausea and has tolerated PO. Stable for discharge at this time. Will provide analgesics and antiemetics for home use. Instructed patient to follow-up with in 2 days. Return precautions discussed. Patient expresses understanding and agreement to plan.    Final Clinical Impressions(s) / ED Diagnoses   Final diagnoses:  Generalized abdominal pain  Non-intractable vomiting with nausea, unspecified vomiting type    New Prescriptions Discharge Medication List as of 07/13/2016 10:49 PM    START taking these medications   Details  dicyclomine (BENTYL) 20 MG tablet Take 1 tablet (20 mg total) by mouth 2 (two) times daily., Starting Mon 07/13/2016, Print    ondansetron (ZOFRAN) 4 MG tablet Take 1 tablet (4 mg total) by mouth every 6 (six) hours., Starting Mon 07/13/2016, Print         Volanda Napoleon, PA-C 07/16/16 2146    Tanna Furry, MD 07/24/16 2102

## 2016-07-13 NOTE — ED Notes (Signed)
Patient stated that she could not void at the moment and after writer asked patient to try 2x patient stated she wanted to wait until her fluids were finished running.

## 2016-07-15 ENCOUNTER — Encounter (HOSPITAL_COMMUNITY): Payer: Self-pay

## 2016-07-15 ENCOUNTER — Inpatient Hospital Stay (HOSPITAL_COMMUNITY)
Admission: EM | Admit: 2016-07-15 | Discharge: 2016-07-18 | DRG: 638 | Disposition: A | Discharge status: Home / Self Care | Payer: Medicaid Other | Attending: Family Medicine | Admitting: Family Medicine

## 2016-07-15 ENCOUNTER — Emergency Department (HOSPITAL_COMMUNITY): Payer: Medicaid Other

## 2016-07-15 DIAGNOSIS — D72829 Elevated white blood cell count, unspecified: Secondary | ICD-10-CM | POA: Diagnosis not present

## 2016-07-15 DIAGNOSIS — E875 Hyperkalemia: Secondary | ICD-10-CM | POA: Diagnosis present

## 2016-07-15 DIAGNOSIS — N179 Acute kidney failure, unspecified: Secondary | ICD-10-CM | POA: Diagnosis present

## 2016-07-15 DIAGNOSIS — N390 Urinary tract infection, site not specified: Secondary | ICD-10-CM | POA: Diagnosis present

## 2016-07-15 DIAGNOSIS — Z91018 Allergy to other foods: Secondary | ICD-10-CM

## 2016-07-15 DIAGNOSIS — R0682 Tachypnea, not elsewhere classified: Secondary | ICD-10-CM

## 2016-07-15 DIAGNOSIS — E1010 Type 1 diabetes mellitus with ketoacidosis without coma: Principal | ICD-10-CM | POA: Diagnosis present

## 2016-07-15 DIAGNOSIS — R000 Tachycardia, unspecified: Secondary | ICD-10-CM | POA: Diagnosis present

## 2016-07-15 DIAGNOSIS — E872 Acidosis: Secondary | ICD-10-CM | POA: Diagnosis not present

## 2016-07-15 DIAGNOSIS — E871 Hypo-osmolality and hyponatremia: Secondary | ICD-10-CM | POA: Diagnosis present

## 2016-07-15 DIAGNOSIS — E876 Hypokalemia: Secondary | ICD-10-CM | POA: Diagnosis present

## 2016-07-15 DIAGNOSIS — Z91010 Allergy to peanuts: Secondary | ICD-10-CM | POA: Diagnosis not present

## 2016-07-15 DIAGNOSIS — K3184 Gastroparesis: Secondary | ICD-10-CM | POA: Diagnosis present

## 2016-07-15 DIAGNOSIS — E1110 Type 2 diabetes mellitus with ketoacidosis without coma: Secondary | ICD-10-CM | POA: Diagnosis present

## 2016-07-15 DIAGNOSIS — J45909 Unspecified asthma, uncomplicated: Secondary | ICD-10-CM | POA: Diagnosis present

## 2016-07-15 DIAGNOSIS — Z794 Long term (current) use of insulin: Secondary | ICD-10-CM

## 2016-07-15 DIAGNOSIS — IMO0002 Reserved for concepts with insufficient information to code with codable children: Secondary | ICD-10-CM | POA: Diagnosis present

## 2016-07-15 DIAGNOSIS — R0789 Other chest pain: Secondary | ICD-10-CM

## 2016-07-15 DIAGNOSIS — Z888 Allergy status to other drugs, medicaments and biological substances status: Secondary | ICD-10-CM

## 2016-07-15 DIAGNOSIS — E860 Dehydration: Secondary | ICD-10-CM | POA: Diagnosis present

## 2016-07-15 DIAGNOSIS — I10 Essential (primary) hypertension: Secondary | ICD-10-CM | POA: Diagnosis present

## 2016-07-15 DIAGNOSIS — K760 Fatty (change of) liver, not elsewhere classified: Secondary | ICD-10-CM | POA: Diagnosis present

## 2016-07-15 DIAGNOSIS — Z79899 Other long term (current) drug therapy: Secondary | ICD-10-CM

## 2016-07-15 DIAGNOSIS — E1043 Type 1 diabetes mellitus with diabetic autonomic (poly)neuropathy: Secondary | ICD-10-CM | POA: Diagnosis present

## 2016-07-15 DIAGNOSIS — E1065 Type 1 diabetes mellitus with hyperglycemia: Secondary | ICD-10-CM | POA: Diagnosis present

## 2016-07-15 LAB — BASIC METABOLIC PANEL
ANION GAP: 14 (ref 5–15)
ANION GAP: 9 (ref 5–15)
BUN: 24 mg/dL — ABNORMAL HIGH (ref 6–20)
BUN: 29 mg/dL — AB (ref 6–20)
BUN: 23 mg/dL — AB (ref 6–20)
BUN: 20 mg/dL (ref 6–20)
CALCIUM: 10.1 mg/dL (ref 8.9–10.3)
CHLORIDE: 107 mmol/L (ref 101–111)
CHLORIDE: 112 mmol/L — AB (ref 101–111)
CHLORIDE: 109 mmol/L (ref 101–111)
CO2: <7 mmol/L — ABNORMAL LOW (ref 22–32)
CO2: 8 mmol/L — ABNORMAL LOW (ref 22–32)
CO2: 15 mmol/L — ABNORMAL LOW (ref 22–32)
Calcium: 9 mg/dL (ref 8.9–10.3)
Calcium: 8.7 mg/dL — ABNORMAL LOW (ref 8.9–10.3)
Calcium: 8.6 mg/dL — ABNORMAL LOW (ref 8.9–10.3)
Chloride: 98 mmol/L — ABNORMAL LOW (ref 101–111)
Creatinine, Ser: 1.48 mg/dL — ABNORMAL HIGH (ref 0.44–1.00)
Creatinine, Ser: 1.31 mg/dL — ABNORMAL HIGH (ref 0.44–1.00)
Creatinine, Ser: 0.8 mg/dL (ref 0.44–1.00)
Creatinine, Ser: 0.56 mg/dL (ref 0.44–1.00)
GFR calc Af Amer: >60 mL/min (ref >60)
GFR calc Af Amer: >60 mL/min (ref >60)
GFR calc Af Amer: >60 mL/min (ref >60)
GFR calc non Af Amer: 48 mL/min — ABNORMAL LOW (ref >60)
GFR calc non Af Amer: 56 mL/min — ABNORMAL LOW (ref >60)
GFR calc non Af Amer: >60 mL/min (ref >60)
GFR calc non Af Amer: >60 mL/min (ref >60)
GFR, EST AFRICAN AMERICAN: 56 mL/min — AB (ref >60)
GLUCOSE: 593 mg/dL — AB (ref 65–99)
GLUCOSE: 375 mg/dL — AB (ref 65–99)
GLUCOSE: 174 mg/dL — AB (ref 65–99)
Glucose, Bld: 162 mg/dL — ABNORMAL HIGH (ref 65–99)
POTASSIUM: 5.2 mmol/L — AB (ref 3.5–5.1)
POTASSIUM: 4.4 mmol/L (ref 3.5–5.1)
POTASSIUM: 3.4 mmol/L — AB (ref 3.5–5.1)
Potassium: 6.5 mmol/L (ref 3.5–5.1)
SODIUM: 134 mmol/L — AB (ref 135–145)
SODIUM: 133 mmol/L — AB (ref 135–145)
Sodium: 132 mmol/L — ABNORMAL LOW (ref 135–145)
Sodium: 134 mmol/L — ABNORMAL LOW (ref 135–145)

## 2016-07-15 LAB — CBC WITH DIFFERENTIAL/PLATELET
BASOS PCT: 0 %
Basophils Absolute: 0 10*3/uL (ref 0.0–0.1)
EOS PCT: 0 %
Eosinophils Absolute: 0 10*3/uL (ref 0.0–0.7)
HEMATOCRIT: 41.4 % (ref 36.0–46.0)
HEMOGLOBIN: 13.2 g/dL (ref 12.0–15.0)
LYMPHS ABS: 2.1 10*3/uL (ref 0.7–4.0)
LYMPHS PCT: 10 %
MCH: 29.3 pg (ref 26.0–34.0)
MCHC: 31.9 g/dL (ref 30.0–36.0)
MCV: 91.8 fL (ref 78.0–100.0)
MONOS PCT: 12 %
Monocytes Absolute: 2.5 10*3/uL — ABNORMAL HIGH (ref 0.1–1.0)
NEUTROS ABS: 15.9 10*3/uL — AB (ref 1.7–7.7)
Neutrophils Relative %: 78 %
Platelets: 640 10*3/uL — ABNORMAL HIGH (ref 150–400)
RBC: 4.51 MIL/uL (ref 3.87–5.11)
RDW: 13.9 % (ref 11.5–15.5)
WBC: 20.5 10*3/uL — ABNORMAL HIGH (ref 4.0–10.5)

## 2016-07-15 LAB — GLUCOSE, CAPILLARY
GLUCOSE-CAPILLARY: 163 mg/dL — AB (ref 65–99)
GLUCOSE-CAPILLARY: 153 mg/dL — AB (ref 65–99)
GLUCOSE-CAPILLARY: 154 mg/dL — AB (ref 65–99)
Glucose-Capillary: 140 mg/dL — ABNORMAL HIGH (ref 65–99)
Glucose-Capillary: 145 mg/dL — ABNORMAL HIGH (ref 65–99)
Glucose-Capillary: 151 mg/dL — ABNORMAL HIGH (ref 65–99)
Glucose-Capillary: 163 mg/dL — ABNORMAL HIGH (ref 65–99)

## 2016-07-15 LAB — BLOOD GAS, VENOUS
O2 SAT: 74.2 %
Patient temperature: 98.6
pH, Ven: 6.956 — CL (ref 7.250–7.430)
pO2, Ven: 62.4 mmHg — ABNORMAL HIGH (ref 32.0–45.0)

## 2016-07-15 LAB — I-STAT CHEM 8, ED
BUN: 24 mg/dL — AB (ref 6–20)
CALCIUM ION: 1.36 mmol/L (ref 1.15–1.40)
CHLORIDE: 104 mmol/L (ref 101–111)
CREATININE: 0.8 mg/dL (ref 0.44–1.00)
Glucose, Bld: 596 mg/dL (ref 65–99)
HCT: 45 % (ref 36.0–46.0)
Hemoglobin: 15.3 g/dL — ABNORMAL HIGH (ref 12.0–15.0)
Potassium: 6.2 mmol/L — ABNORMAL HIGH (ref 3.5–5.1)
Sodium: 130 mmol/L — ABNORMAL LOW (ref 135–145)
TCO2: 7 mmol/L (ref 0–100)

## 2016-07-15 LAB — BLOOD GAS, ARTERIAL
DRAWN BY: 257881
Drawn by: 257881
O2 SAT: 95.8 %
O2 Saturation: 97.8 %
PATIENT TEMPERATURE: 98.6
PH ART: 6.88 — AB (ref 7.350–7.450)
PH ART: 7.188 — AB (ref 7.350–7.450)
PO2 ART: 154 mmHg — AB (ref 83.0–108.0)
Patient temperature: 98.6
pO2, Arterial: 127 mmHg — ABNORMAL HIGH (ref 83.0–108.0)

## 2016-07-15 LAB — CBG MONITORING, ED
GLUCOSE-CAPILLARY: 485 mg/dL — AB (ref 65–99)
GLUCOSE-CAPILLARY: 447 mg/dL — AB (ref 65–99)
GLUCOSE-CAPILLARY: 311 mg/dL — AB (ref 65–99)
GLUCOSE-CAPILLARY: 214 mg/dL — AB (ref 65–99)
GLUCOSE-CAPILLARY: 182 mg/dL — AB (ref 65–99)
Glucose-Capillary: 389 mg/dL — ABNORMAL HIGH (ref 65–99)
Glucose-Capillary: 489 mg/dL — ABNORMAL HIGH (ref 65–99)
Glucose-Capillary: 413 mg/dL — ABNORMAL HIGH (ref 65–99)
Glucose-Capillary: 188 mg/dL — ABNORMAL HIGH (ref 65–99)

## 2016-07-15 LAB — I-STAT TROPONIN, ED: Troponin i, poc: 0.02 ng/mL (ref 0.00–0.08)

## 2016-07-15 LAB — CREATININE, SERUM
Creatinine, Ser: 0.84 mg/dL (ref 0.44–1.00)
GFR calc Af Amer: >60 mL/min (ref >60)

## 2016-07-15 LAB — CBC
HCT: 45.2 % (ref 36.0–46.0)
Hemoglobin: 15.6 g/dL — ABNORMAL HIGH (ref 12.0–15.0)
MCH: 29.9 pg (ref 26.0–34.0)
MCHC: 34.5 g/dL (ref 30.0–36.0)
MCV: 86.6 fL (ref 78.0–100.0)
PLATELETS: 327 10*3/uL (ref 150–400)
RBC: 5.22 MIL/uL — ABNORMAL HIGH (ref 3.87–5.11)
RDW: 13.4 % (ref 11.5–15.5)
WBC: 13.1 10*3/uL — ABNORMAL HIGH (ref 4.0–10.5)

## 2016-07-15 LAB — MRSA PCR SCREENING: MRSA by PCR: NEGATIVE

## 2016-07-15 LAB — D-DIMER, QUANTITATIVE (NOT AT ARMC): D DIMER QUANT: 1.73 ug{FEU}/mL — AB (ref 0.00–0.50)

## 2016-07-15 LAB — TSH: TSH: 0.803 u[IU]/mL (ref 0.350–4.500)

## 2016-07-15 MED ORDER — MOMETASONE FURO-FORMOTEROL FUM 200-5 MCG/ACT IN AERO
2.0000 | INHALATION_SPRAY | Freq: Two times a day (BID) | RESPIRATORY_TRACT | Status: DC
  Filled 2016-07-15: qty 8.8

## 2016-07-15 MED ORDER — ONDANSETRON HCL 4 MG PO TABS
4.0000 mg | ORAL_TABLET | Freq: Four times a day (QID) | ORAL | Status: DC
  Administered 2016-07-15 – 2016-07-16 (×2): 4 mg via ORAL
  Filled 2016-07-15 (×2): qty 1

## 2016-07-15 MED ORDER — SODIUM CHLORIDE 0.9 % IV BOLUS (SEPSIS)
1000.0000 mL | Freq: Once | INTRAVENOUS | Status: AC
  Administered 2016-07-15: 1000 mL via INTRAVENOUS

## 2016-07-15 MED ORDER — SODIUM CHLORIDE 0.9 % IV SOLN: INTRAVENOUS | Status: AC

## 2016-07-15 MED ORDER — OXYCODONE-ACETAMINOPHEN 5-325 MG PO TABS
1.0000 | ORAL_TABLET | Freq: Once | ORAL | Status: AC
  Administered 2016-07-15: 1 via ORAL
  Filled 2016-07-15: qty 1

## 2016-07-15 MED ORDER — ERYTHROMYCIN BASE 250 MG PO TABS
250.0000 mg | ORAL_TABLET | Freq: Three times a day (TID) | ORAL | Status: DC
  Administered 2016-07-16 – 2016-07-18 (×4): 250 mg via ORAL
  Filled 2016-07-15 (×7): qty 1

## 2016-07-15 MED ORDER — POTASSIUM CHLORIDE 10 MEQ/100ML IV SOLN: 10.0000 meq | INTRAVENOUS | Status: AC

## 2016-07-15 MED ORDER — SODIUM BICARBONATE 8.4 % IV SOLN
50.0000 meq | Freq: Once | INTRAVENOUS | Status: AC
Start: 2016-07-15 — End: 2016-07-15
  Administered 2016-07-15: 50 meq via INTRAVENOUS
  Filled 2016-07-15: qty 50

## 2016-07-15 MED ORDER — SODIUM CHLORIDE 0.9 % IV BOLUS (SEPSIS)
1000.0000 mL | Freq: Once | INTRAVENOUS | Status: AC
Start: 2016-07-15 — End: 2016-07-15
  Administered 2016-07-15: 1000 mL via INTRAVENOUS

## 2016-07-15 MED ORDER — VITAMIN D (ERGOCALCIFEROL) 1.25 MG (50000 UNIT) PO CAPS
50000.0000 [IU] | ORAL_CAPSULE | ORAL | Status: DC
  Administered 2016-07-16: 50000 [IU] via ORAL
  Filled 2016-07-15: qty 1

## 2016-07-15 MED ORDER — METOPROLOL TARTRATE 25 MG PO TABS
12.5000 mg | ORAL_TABLET | Freq: Two times a day (BID) | ORAL | Status: DC
  Administered 2016-07-15 – 2016-07-18 (×5): 12.5 mg via ORAL
  Filled 2016-07-15 (×6): qty 1

## 2016-07-15 MED ORDER — OXYCODONE-ACETAMINOPHEN 5-325 MG PO TABS: 2.0000 | ORAL_TABLET | Freq: Once | ORAL | Status: DC

## 2016-07-15 MED ORDER — ALBUTEROL SULFATE (2.5 MG/3ML) 0.083% IN NEBU: 3.0000 mL | INHALATION_SOLUTION | Freq: Four times a day (QID) | RESPIRATORY_TRACT | Status: DC | PRN

## 2016-07-15 MED ORDER — IOPAMIDOL (ISOVUE-370) INJECTION 76%
INTRAVENOUS | Status: AC
  Filled 2016-07-15: qty 100

## 2016-07-15 MED ORDER — SODIUM CHLORIDE 0.9 % IV SOLN: INTRAVENOUS | Status: DC

## 2016-07-15 MED ORDER — PANTOPRAZOLE SODIUM 40 MG PO TBEC
40.0000 mg | DELAYED_RELEASE_TABLET | Freq: Two times a day (BID) | ORAL | Status: DC
  Administered 2016-07-15 – 2016-07-18 (×5): 40 mg via ORAL
  Filled 2016-07-15 (×6): qty 1

## 2016-07-15 MED ORDER — PROMETHAZINE HCL 25 MG PO TABS
25.0000 mg | ORAL_TABLET | Freq: Four times a day (QID) | ORAL | Status: DC | PRN
  Administered 2016-07-15: 25 mg via ORAL
  Filled 2016-07-15: qty 1

## 2016-07-15 MED ORDER — SODIUM CHLORIDE 0.9 % IV SOLN
INTRAVENOUS | Status: DC
  Administered 2016-07-15: 4.3 [IU]/h via INTRAVENOUS
  Filled 2016-07-15: qty 1

## 2016-07-15 MED ORDER — DICYCLOMINE HCL 20 MG PO TABS
20.0000 mg | ORAL_TABLET | Freq: Two times a day (BID) | ORAL | Status: DC
  Administered 2016-07-15 – 2016-07-18 (×5): 20 mg via ORAL
  Filled 2016-07-15 (×7): qty 1

## 2016-07-15 MED ORDER — IOPAMIDOL (ISOVUE-370) INJECTION 76%
100.0000 mL | Freq: Once | INTRAVENOUS | Status: AC | PRN
  Administered 2016-07-15: 80 mL via INTRAVENOUS

## 2016-07-15 MED ORDER — ACETAMINOPHEN 325 MG PO TABS: 650.0000 mg | ORAL_TABLET | Freq: Once | ORAL | Status: DC

## 2016-07-15 MED ORDER — ALBUTEROL SULFATE (2.5 MG/3ML) 0.083% IN NEBU: 2.5000 mg | INHALATION_SOLUTION | Freq: Four times a day (QID) | RESPIRATORY_TRACT | Status: DC | PRN

## 2016-07-15 MED ORDER — DEXTROSE-NACL 5-0.45 % IV SOLN
INTRAVENOUS | Status: DC
  Administered 2016-07-15 (×2): via INTRAVENOUS

## 2016-07-15 MED ORDER — IBUPROFEN 200 MG PO TABS
400.0000 mg | ORAL_TABLET | Freq: Four times a day (QID) | ORAL | Status: DC | PRN
  Administered 2016-07-15 – 2016-07-16 (×2): 400 mg via ORAL
  Filled 2016-07-15 (×2): qty 2

## 2016-07-15 MED ORDER — HEPARIN SODIUM (PORCINE) 5000 UNIT/ML IJ SOLN
5000.0000 [IU] | Freq: Three times a day (TID) | INTRAMUSCULAR | Status: DC
  Administered 2016-07-15 – 2016-07-16 (×2): 5000 [IU] via SUBCUTANEOUS
  Filled 2016-07-15 (×2): qty 1

## 2016-07-15 MED ORDER — DEXTROSE-NACL 5-0.45 % IV SOLN: INTRAVENOUS | Status: DC

## 2016-07-15 MED ORDER — SODIUM BICARBONATE 8.4 % IV SOLN
INTRAVENOUS | Status: DC
  Administered 2016-07-15 – 2016-07-16 (×2): via INTRAVENOUS
  Filled 2016-07-15 (×2): qty 100

## 2016-07-15 MED ORDER — SODIUM CHLORIDE 0.9 % IV SOLN
INTRAVENOUS | Status: DC
  Administered 2016-07-15: 5.5 [IU]/h via INTRAVENOUS
  Filled 2016-07-15: qty 1

## 2016-07-15 NOTE — Progress Notes (Signed)
Pt having complaints of pain not relieved by ordered ibuprofen. K. Shorr,NP paged to notify of pt's complaints.

## 2016-07-15 NOTE — ED Notes (Signed)
Respiratory called for ABG

## 2016-07-15 NOTE — ED Notes (Signed)
Patient requesting medication for back pain. Hospitalist made aware.

## 2016-07-15 NOTE — ED Provider Notes (Signed)
Patient care was assumed at shift change from Dr. Dina Rich. Please see her note for further.  Briefly, patient with a history of diabetes presents to the emergency department complaining of chest pain, shortness of breath and increased rate of breathing. Suspect DKA in this patient. Blood sugar is elevated on fingerstick. Awaiting blood work. Also concern that she may have a PE due to her chest pain and shortness of breath which is not typical for her with hyperglycemia. I-STAT Chem-8 revealed a glucose of 596 and bicarbonate of 7. Patient with DKA.   Plan at shift change is to treat this patient for DKA with glucose stabilizer and also obtain d-dimer due to suspicion for possible PE. Glucose stabilizer ordered.   On my first evaluation patient is tachycardic into the 140s and his tachypnea quit a respiration rate of around 36. Oxygen saturation is 100% on room air.  D-dimer is elevated. Will obtain CTA chest to rule out PE.   At recheck while patient is awaiting CT scan patient's respirations are improving and her heart rate is improving. Suspect her tachypnea and tachycardia is related to her DKA. ABG reveals a pH of 6.956. She is alert and oriented. She is drinking ginger ale. Glucose stabilizer is running.  CT angiographic of her chest is nondiagnostic for PE due to severe respiratory artifact.  At reevaluation patient's respirations are again improving and her heart rate is also improving with a heart rate of around 120 at this evaluation. I advised plan for admission and patient and family agree. Will consult for admission.  I discussed the patient with hospitalist Dr. Reesa Chew who accepted the patient for admission.   Results for orders placed or performed during the hospital encounter of 07/15/16  CBC with Differential  Result Value Ref Range   WBC 20.5 (H) 4.0 - 10.5 K/uL   RBC 4.51 3.87 - 5.11 MIL/uL   Hemoglobin 13.2 12.0 - 15.0 g/dL   HCT 41.4 36.0 - 46.0 %   MCV 91.8 78.0 - 100.0 fL    MCH 29.3 26.0 - 34.0 pg   MCHC 31.9 30.0 - 36.0 g/dL   RDW 13.9 11.5 - 15.5 %   Platelets 640 (H) 150 - 400 K/uL   Neutrophils Relative % 78 %   Lymphocytes Relative 10 %   Monocytes Relative 12 %   Eosinophils Relative 0 %   Basophils Relative 0 %   Neutro Abs 15.9 (H) 1.7 - 7.7 K/uL   Lymphs Abs 2.1 0.7 - 4.0 K/uL   Monocytes Absolute 2.5 (H) 0.1 - 1.0 K/uL   Eosinophils Absolute 0.0 0.0 - 0.7 K/uL   Basophils Absolute 0.0 0.0 - 0.1 K/uL   WBC Morphology MILD LEFT SHIFT (1-5% METAS, OCC MYELO, OCC BANDS)   D-dimer, quantitative (not at [facility])  Result Value Ref Range   D-Dimer, Quant 1.73 (H) 0.00 - 0.50 ug/mL-FEU  Blood gas, venous  Result Value Ref Range   pH, Ven 6.956 (LL) 7.250 - 7.430   pCO2, Ven  44.0 - 60.0 mmHg    CRITICAL RESULT CALLED TO, READ BACK BY AND VERIFIED WITH:   pO2, Ven 62.4 (H) 32.0 - 45.0 mmHg   O2 Saturation 74.2 %   Patient temperature 98.6    Collection site VEIN    Drawn by DRAWN BY RN    Sample type VEIN   Basic metabolic panel  Result Value Ref Range   Sodium 132 (L) 135 - 145 mmol/L   Potassium 6.5 (HH) 3.5 -  5.1 mmol/L   Chloride 98 (L) 101 - 111 mmol/L   CO2 <7 (L) 22 - 32 mmol/L   Glucose, Bld 593 (HH) 65 - 99 mg/dL   BUN 24 (H) 6 - 20 mg/dL   Creatinine, Ser 1.48 (H) 0.44 - 1.00 mg/dL   Calcium 10.1 8.9 - 10.3 mg/dL   GFR calc non Af Amer 48 (L) >60 mL/min   GFR calc Af Amer 56 (L) >60 mL/min   Anion gap NOT CALCULATED 5 - 15  Basic metabolic panel  Result Value Ref Range   Sodium 134 (L) 135 - 145 mmol/L   Potassium 5.2 (H) 3.5 - 5.1 mmol/L   Chloride 107 101 - 111 mmol/L   CO2 <7 (L) 22 - 32 mmol/L   Glucose, Bld 375 (H) 65 - 99 mg/dL   BUN 29 (H) 6 - 20 mg/dL   Creatinine, Ser 1.31 (H) 0.44 - 1.00 mg/dL   Calcium 9.0 8.9 - 10.3 mg/dL   GFR calc non Af Amer 56 (L) >60 mL/min   GFR calc Af Amer >60 >60 mL/min   Anion gap NOT CALCULATED 5 - 15  Blood gas, arterial  Result Value Ref Range   pH, Arterial 6.880 (LL) 7.350 -  7.450   pCO2 arterial  32.0 - 48.0 mmHg    CRITICAL RESULT CALLED TO, READ BACK BY AND VERIFIED WITH:   pO2, Arterial 154 (H) 83.0 - 108.0 mmHg   O2 Saturation 95.8 %   Patient temperature 98.6    Drawn by [id number]    Sample type ARTERIAL DRAW    Allens test (pass/fail) PASS PASS  TSH  Result Value Ref Range   TSH 0.803 0.350 - 4.500 uIU/mL  Blood gas, arterial  Result Value Ref Range   pH, Arterial 7.188 (LL) 7.350 - 7.450   pCO2 arterial  32.0 - 48.0 mmHg    CRITICAL RESULT CALLED TO, READ BACK BY AND VERIFIED WITH:   pO2, Arterial 127 (H) 83.0 - 108.0 mmHg   O2 Saturation 97.8 %   Patient temperature 98.6    Collection site RIGHT RADIAL    Drawn by [id number]    Sample type ARTERIAL DRAW    Allens test (pass/fail) PASS PASS  CBG monitoring, ED  Result Value Ref Range   Glucose-Capillary 389 (H) 65 - 99 mg/dL   Comment 1 Notify RN    Comment 2 Document in Chart   I-Stat Chem 8, ED  Result Value Ref Range   Sodium 130 (L) 135 - 145 mmol/L   Potassium 6.2 (H) 3.5 - 5.1 mmol/L   Chloride 104 101 - 111 mmol/L   BUN 24 (H) 6 - 20 mg/dL   Creatinine, Ser 0.80 0.44 - 1.00 mg/dL   Glucose, Bld 596 (HH) 65 - 99 mg/dL   Calcium, Ion 1.36 1.15 - 1.40 mmol/L   TCO2 7 0 - 100 mmol/L   Hemoglobin 15.3 (H) 12.0 - 15.0 g/dL   HCT 45.0 36.0 - 46.0 %   Comment NOTIFIED PHYSICIAN   I-Stat Troponin, ED (not at [facility])  Result Value Ref Range   Troponin i, poc 0.02 0.00 - 0.08 ng/mL   Comment 3          CBG monitoring, ED  Result Value Ref Range   Glucose-Capillary 489 (H) 65 - 99 mg/dL  CBG monitoring, ED  Result Value Ref Range   Glucose-Capillary 485 (H) 65 - 99 mg/dL  CBG monitoring, ED  Result Value Ref Range  Glucose-Capillary 447 (H) 65 - 99 mg/dL  CBG monitoring, ED  Result Value Ref Range   Glucose-Capillary 413 (H) 65 - 99 mg/dL  CBG monitoring, ED  Result Value Ref Range   Glucose-Capillary 311 (H) 65 - 99 mg/dL  CBG monitoring, ED  Result Value Ref Range    Glucose-Capillary 214 (H) 65 - 99 mg/dL  CBG monitoring, ED  Result Value Ref Range   Glucose-Capillary 188 (H) 65 - 99 mg/dL  CBG monitoring, ED  Result Value Ref Range   Glucose-Capillary 182 (H) 65 - 99 mg/dL   Dg Chest 2 View  Result Date: 07/15/2016 CLINICAL DATA:  New onset midsternal chest pain and dyspnea. Tachycardia this morning. EXAM: CHEST  2 VIEW COMPARISON:  06/03/2016 FINDINGS: The lungs are clear. The pulmonary vasculature is normal. Heart size is normal. Hilar and mediastinal contours are unremarkable. There is no pleural effusion. IMPRESSION: No active cardiopulmonary disease. Electronically Signed   By: Andreas Newport M.D.   On: 07/15/2016 06:12   Ct Angio Chest Pe W/cm &/or Wo Cm  Result Date: 07/15/2016 CLINICAL DATA:  Shortness of breath. EXAM: CT ANGIOGRAPHY CHEST WITH CONTRAST TECHNIQUE: Multidetector CT imaging of the chest was performed using the standard protocol during bolus administration of intravenous contrast. Multiplanar CT image reconstructions and MIPs were obtained to evaluate the vascular anatomy. CONTRAST:  60 mL of Isovue 370 COMPARISON:  Chest x-ray from earlier today FINDINGS: Cardiovascular: Evaluation is severely limited due to severe respiratory motion. Limited views of the thoracic aorta demonstrate no aneurysm or dissection. The heart size is normal. No coronary artery calcifications are identified. Evaluation of the pulmonary arteries is markedly limited due to respiratory motion. The study is nondiagnostic in the evaluation for pulmonary emboli. Significant stairstep artifact is identified. Ill defined low-attenuation in the left main pulmonary artery may be artifactual. Pulmonary emboli cannot be confidently diagnosis or excluded on this study. Mediastinum/Nodes: The esophagus is fluid-filled along much of its length and it is thick walled over its distal third. No mediastinal air is noted. No pleural or pericardial effusions. No adenopathy.  Lungs/Pleura: Evaluation is markedly limited due to severe respiratory motion. Central airways are normal. No pneumothorax. No evidence of pneumonia or aspiration. A small nodule in the posterior left upper lobe on series 11, image 19 is not excluded measuring 4 mm. This is of doubtful significance in a patient this age. No other nodules. No masses. Upper Abdomen: The stomach appears to be mildly distended within visualize limits. Hepatic steatosis. No other abnormalities are seen in the upper abdomen. Fullness of the right renal collecting system is not excluded on limited images. Musculoskeletal: No chest wall abnormality. No acute or significant osseous findings. Review of the MIP images confirms the above findings. IMPRESSION: 1. The study is nondiagnostic in the evaluation for pulmonary emboli due to severe respiratory artifact. Pulmonary emboli cannot be confidently diagnosed or excluded. Ill defined low-attenuation in the left main pulmonary artery is nonspecific but could be artifactual. If there is continued concern for pulmonary emboli, a repeat study when the patient is breathing less rapidly or a V/Q scan may help with further evaluation. 2. The esophagus is fluid-filled and thick walled suggesting esophagitis. This could potentially be the cause of the patient's symptoms. Direct visualization could better evaluate. 3. Possible tiny nodule in the left upper lobe of doubtful significance given patient age. Electronically Signed   By: Dorise Bullion III M.D   On: 07/15/2016 09:57   Nm Gastric Emptying  Result Date: 07/09/2016 CLINICAL DATA:  Nausea and vomiting, abdominal pain for 2 years, diabetes mellitus EXAM: NUCLEAR MEDICINE GASTRIC EMPTYING SCAN TECHNIQUE: After oral ingestion of radiolabeled meal, sequential abdominal images were obtained for 4 hours. Percentage of activity emptying the stomach was calculated at 1 hour, 2 hour, 3 hour, and 4 hours. RADIOPHARMACEUTICALS:  2.2 mCi Tc-90m sulfur  colloid in standardized meal COMPARISON:  None FINDINGS: Expected location of the stomach in the left upper quadrant. Ingested meal empties the stomach poorly over the course of the study. 2% emptied at 1 hr ( normal >= 10%) 16% emptied at 2 hr ( normal >= 40%) 20% emptied at 3 hr ( normal >= 70%) 24% emptied at 4 hr ( normal >= 90%) IMPRESSION: Significantly delayed gastric emptying study. Electronically Signed   By: Lavonia Dana M.D.   On: 07/09/2016 14:23   Dg Abd 2 Views  Result Date: 06/24/2016 CLINICAL DATA:  26 year old presenting with acute onset of abdominal pain and nausea and vomiting. Current history of poorly controlled diabetes with prior episodes of diabetic ketoacidosis. EXAM: ABDOMEN - 2 VIEW COMPARISON:  Abdominal x-rays 06/02/2016, 05/31/2016 and earlier. CT abdomen pelvis 02/20/2016 and earlier. Chest x-rays 05/31/2016, 01/17/2016 and earlier. FINDINGS: Bowel gas pattern unremarkable without evidence of obstruction or significant ileus. No evidence of free air or significant air-fluid levels on the erect image. Expected stool burden in the colon. Surgical clips in the right upper quadrant from prior cholecystectomy. Phleboliths low on the left side of the pelvis as noted previously. No opaque urinary tract calculi. Regional skeleton intact. IMPRESSION: No acute abdominal abnormality. Electronically Signed   By: Evangeline Dakin M.D.   On: 06/24/2016 15:36    CRITICAL CARE Performed by: Hanley Hays   Total critical care time: 45 minutes  Critical care time was exclusive of separately billable procedures and treating other patients.  Critical care was necessary to treat or prevent imminent or life-threatening deterioration.  Critical care was time spent personally by me on the following activities: development of treatment plan with patient and/or surrogate as well as nursing, discussions with consultants, evaluation of patient's response to treatment, examination of  patient, obtaining history from patient or surrogate, ordering and performing treatments and interventions, ordering and review of laboratory studies, ordering and review of radiographic studies, pulse oximetry and re-evaluation of patient's condition.   Diabetic ketoacidosis without coma associated with type 1 diabetes mellitus ([place])  Other chest pain      Waynetta Pean, PA-C 07/15/16 [place], Barbette Hair, MD 07/16/16 [phone]

## 2016-07-15 NOTE — H&P (Addendum)
History and Physical    [name] QIO:[id number] DOB: Aug 19, 1990 DOA: 07/15/2016  PCP: Vicenta Aly, FNP Patient coming from: Home  Chief Complaint: Abnormal breathing  HPI: [name] is a 26 y.o. female with medical history significant of diabetes type 1, hypertension, diabetic gastroparesis, asthma came to the ER for abnormal breathing. Patient is drowsy but easily arousable. Doesn't know much of what happened therefore I spoke with her Mother [name] over the phone. Patient's aunt is at bedside. According to the mother patient was doing well up until yesterday evening when she complained of epigastric heartburn therefore she gave the patient her home regimen of PPI and went to bed thereafter. This morning when the mother woke up she noted patient was breathing very heavily and immediately thought patient must be in DKA as this has happened to her in the past. Her mother since her last discharge every time they check her blood sugar at home has been in range of 100-150. According to the mother patient is compliant with her medication but not sure as she does work long hours. Patient was recently here and discharge on 07/11/2016. She was admitted for DKA and persistent symptoms of nausea vomiting and abdominal pain. She was noted to have diabetic gastroparesis therefore she was started on promotility agents. Eventually she was discharged on Lantus 12 units at bedtime with moderate NovoLog sliding scale. In the ER patient was noted to have hyperglycemia with elevated anion gap, hyperkalemia, creatinine and leukocytosis. Patient was started on insulin drip. EKG showed sinus tachycardia otherwise no significant peak T waves.  When I evaluated the patient she appeared very drowsy therefore I ordered repeat ABG which showed worsening of her pH from 6.9 to 6.8 and bicarbonate was undetectable. Bicarbonate drip was started and was admitted for further management   Review of Systems: As  per HPI otherwise 10 point review of systems negative.   Past Medical History:  Diagnosis Date  . Anxiety   . Arthritis   . Asthma   . Diabetes mellitus 2007   IDDM.  poorly controlled, multiple admits with DKA  . Gallstones   . Gastroparesis   . Heart murmur   . Hepatic steatosis 11/26/2014   and hepatomegaly  . Hypertension    NOT CURRENTLY ON ANY BP MED  . Liver mass 11/26/2014  . Pancreatitis, acute 11/26/2014    Past Surgical History:  Procedure Laterality Date  . CHOLECYSTECTOMY N/A 02/11/2015   Procedure: LAPAROSCOPIC CHOLECYSTECTOMY WITH INTRAOPERATIVE CHOLANGIOGRAM;  Surgeon: Greer Pickerel, MD;  Location: [facility];  Service: General;  Laterality: N/A;  . ESOPHAGOGASTRODUODENOSCOPY (EGD) WITH PROPOFOL Left 09/20/2014   Procedure: ESOPHAGOGASTRODUODENOSCOPY (EGD) WITH PROPOFOL;  Surgeon: Arta Silence, MD;  Location: [facility];  Service: Endoscopy;  Laterality: Left;  . WISDOM TOOTH EXTRACTION       reports that she has never smoked. She has never used smokeless tobacco. She reports that she uses drugs, including Marijuana. She reports that she does not drink alcohol.  Allergies  Allergen Reactions  . Peanut-Containing Drug Products Swelling and Other (See Comments)    Reaction:  Swelling of mouth and lips   . 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   Reaction:  Swelling of mouth and lips   . Ultram [Tramadol] Itching    Family History  Problem Relation Age of Onset  . Heart disease Maternal Grandmother   . Heart disease Maternal Grandfather   . Diabetes Mother   .  Hyperlipidemia Mother   . Hypertension Father   . Heart disease Father   . Hypertension Paternal Grandmother   . Cancer Paternal Grandfather     Acceptable: Family history reviewed and not pertinent (If you reviewed it)  Prior to Admission medications   Medication Sig Start Date End Date Taking? Authorizing Provider  albuterol (PROVENTIL HFA;VENTOLIN HFA) 108 (90  BASE) MCG/ACT inhaler Inhale 2 puffs into the lungs every 6 (six) hours as needed for wheezing or shortness of breath.    Yes [provider]  albuterol (PROVENTIL) (2.5 MG/3ML) 0.083% nebulizer solution Take 2.5 mg by nebulization every 6 (six) hours as needed for wheezing or shortness of breath.    Yes [provider]  budesonide-formoterol (SYMBICORT) 160-4.5 MCG/ACT inhaler Inhale 2 puffs into the lungs 2 (two) times daily.    Yes [provider]  dicyclomine (BENTYL) 20 MG tablet Take 1 tablet (20 mg total) by mouth 2 (two) times daily. 07/13/16  Yes Volanda Napoleon, PA-C  erythromycin (E-MYCIN) 250 MG tablet Take 1 tablet (250 mg total) by mouth 3 (three) times daily with meals. 07/11/16  Yes Sheikh, Omair Latif, DO  glucagon (GLUCAGON EMERGENCY) 1 MG injection Inject 1 mg into the vein once as needed. 06/14/16  Yes Sheikh, Omair Latif, DO  insulin aspart (NOVOLOG FLEXPEN) 100 UNIT/ML FlexPen Inject into the skin See admin instructions. Pt uses 1 unit for every 10 grams of carbs with meals and snacks for BS greater than 50.   Yes [provider]  insulin glargine (LANTUS) 100 UNIT/ML injection Inject 0.22 mLs (22 Units total) into the skin daily. 06/28/16  Yes Hosie Poisson, MD  metoprolol tartrate (LOPRESSOR) 25 MG tablet Take 0.5 tablets (12.5 mg total) by mouth 2 (two) times daily. 05/10/16  Yes Mariel Aloe, MD  pantoprazole (PROTONIX) 40 MG tablet Take 1 tablet (40 mg total) by mouth 2 (two) times daily. 02/25/16  Yes Regalado, Belkys A, MD  Vitamin D, Ergocalciferol, (DRISDOL) 50000 units CAPS capsule Take 50,000 Units by mouth once a week. 05/26/16  Yes [provider]  ondansetron (ZOFRAN) 4 MG tablet Take 1 tablet (4 mg total) by mouth every 6 (six) hours. 07/13/16   Volanda Napoleon, PA-C  promethazine (PHENERGAN) 25 MG tablet Take 1 tablet (25 mg total) by mouth every 6 (six) hours as needed for nausea or vomiting. 06/15/16   Deno Etienne, DO     Physical Exam: Vitals:   07/15/16 0950 07/15/16 1000 07/15/16 1015 07/15/16 1030  BP: 126/74 136/84  131/76  Pulse: (!) 122 (!) 120 (!) 125   Resp: 20 (!) 34 (!) 33 (!) 30  Temp:      TempSrc:      SpO2: 100% 100% 100%       Constitutional: Drowsy but easily arousable. Breath sounds like acetone. Heavy breathing but lungs are clear Vitals:   07/15/16 0950 07/15/16 1000 07/15/16 1015 07/15/16 1030  BP: 126/74 136/84  131/76  Pulse: (!) 122 (!) 120 (!) 125   Resp: 20 (!) 34 (!) 33 (!) 30  Temp:      TempSrc:      SpO2: 100% 100% 100%    Eyes: PERRL, lids and conjunctivae normal ENMT: Mucous membranes are moist. Posterior pharynx clear of any exudate or lesions.Normal dentition.  Neck: normal, supple, no masses, no thyromegaly Respiratory: clear to auscultation bilaterally, no wheezing, no crackles. Normal respiratory effort. No accessory muscle use.  Cardiovascular: Regular rate and rhythm, no murmurs /  rubs / gallops. No extremity edema. 2+ pedal pulses. No carotid bruits.  Abdomen: no tenderness, no masses palpated. No hepatosplenomegaly. Bowel sounds positive.  Musculoskeletal: no clubbing / cyanosis. No joint deformity upper and lower extremities. Good ROM, no contractures. Normal muscle tone.  Skin: no rashes, lesions, ulcers. No induration Neurologic: CN 2-12 grossly intact. Sensation intact, DTR normal. Strength 5/5 in all 4.  Psychiatric: Normal judgment and insight. Alert and oriented x 3. Normal mood.     Labs on Admission: I have personally reviewed following labs and imaging studies  CBC:  Recent Labs Lab 07/09/16 0543 07/10/16 0525 07/11/16 0536 07/13/16 1344 07/13/16 2121 07/15/16 0715 07/15/16 0741  WBC 7.2 5.3 5.5 9.2  --  20.5*  --   NEUTROABS 4.2 2.6 2.8  --   --  15.9*  --   HGB 11.0* 10.9* 9.8* 14.1 12.6 13.2 15.3*  HCT 33.2* 33.2* 29.3* 41.5 37.0 41.4 45.0  MCV 86.5 88.3 87.5 87.2  --  91.8  --   PLT 400 357 367 538*  --  640*  --     Basic Metabolic Panel:  Recent Labs Lab 07/09/16 0543 07/10/16 0525 07/11/16 0536 07/13/16 1344 07/13/16 2121 07/15/16 0741 07/15/16 0751  NA 141 139 137 136 138 130* 132*  K 3.2* 3.4* 3.9 3.8 4.8 6.2* 6.5*  CL 111 110 107 97* 105 104 98*  CO2 24 26 26 22   --   --  <7*  GLUCOSE 87 179* 260* 355* 233* 596* 593*  BUN 14 10 5* 13 11 24* 24*  CREATININE 0.61 0.45 0.43* 0.66 0.30* 0.80 1.48*  CALCIUM 8.6* 8.2* 8.4* 10.0  --   --  10.1  MG 1.5* 1.6* 1.6*  --   --   --   --   PHOS 4.0 3.6 3.3  --   --   --   --    GFR: Estimated Creatinine Clearance: 47.5 mL/min (A) (by C-G formula based on SCr of 1.48 mg/dL (H)). Liver Function Tests:  Recent Labs Lab 07/09/16 0543 07/10/16 0525 07/11/16 0536 07/13/16 1344  AST 14* 20 16 20   ALT 10* 12* 12* 12*  ALKPHOS 72 73 77 113  BILITOT 0.5 0.5 0.4 0.7  PROT 5.8* 5.3* 5.3* 8.5*  ALBUMIN 3.2* 2.9* 3.0* 4.8    Recent Labs Lab 07/13/16 1344  LIPASE <10*   No results for input(s): AMMONIA in the last 168 hours. Coagulation Profile: No results for input(s): INR, PROTIME in the last 168 hours. Cardiac Enzymes: No results for input(s): CKTOTAL, CKMB, CKMBINDEX, TROPONINI in the last 168 hours. BNP (last 3 results) No results for input(s): PROBNP in the last 8760 hours. HbA1C: No results for input(s): HGBA1C in the last 72 hours. CBG:  Recent Labs Lab 07/13/16 1358 07/15/16 0531 07/15/16 0802 07/15/16 0940 07/15/16 1051  GLUCAP 244* 389* 489* 485* 447*   Lipid Profile: No results for input(s): CHOL, HDL, LDLCALC, TRIG, CHOLHDL, LDLDIRECT in the last 72 hours. Thyroid Function Tests: No results for input(s): TSH, T4TOTAL, FREET4, T3FREE, THYROIDAB in the last 72 hours. Anemia Panel: No results for input(s): VITAMINB12, FOLATE, FERRITIN, TIBC, IRON, RETICCTPCT in the last 72 hours. Urine analysis:    Component Value Date/Time   COLORURINE STRAW (A) 07/13/2016 1857   APPEARANCEUR CLEAR 07/13/2016 1857   LABSPEC  1.024 07/13/2016 1857   PHURINE 6.0 07/13/2016 1857   GLUCOSEU >=500 (A) 07/13/2016 1857   GLUCOSEU >=1000 11/07/2012 1205   HGBUR NEGATIVE 07/13/2016 1857  BILIRUBINUR NEGATIVE 07/13/2016 1857   KETONESUR 80 (A) 07/13/2016 [place] NEGATIVE 07/13/2016 1857   UROBILINOGEN 0.2 11/24/2014 1045   NITRITE NEGATIVE 07/13/2016 1857   LEUKOCYTESUR TRACE (A) 07/13/2016 1857   Sepsis Labs: !!!!!!!!!!!!!!!!!!!!!!!!!!!!!!!!!!!!!!!!!!!! @LABRCNTIP (procalcitonin:4,lacticidven:4) )No results found for this or any previous visit (from the past 240 hour(s)).   Radiological Exams on Admission: Dg Chest 2 View  Result Date: 07/15/2016 CLINICAL DATA:  New onset midsternal chest pain and dyspnea. Tachycardia this morning. EXAM: CHEST  2 VIEW COMPARISON:  06/03/2016 FINDINGS: The lungs are clear. The pulmonary vasculature is normal. Heart size is normal. Hilar and mediastinal contours are unremarkable. There is no pleural effusion. IMPRESSION: No active cardiopulmonary disease. Electronically Signed   By: Andreas Newport M.D.   On: 07/15/2016 06:12   Ct Angio Chest Pe W/cm &/or Wo Cm  Result Date: 07/15/2016 CLINICAL DATA:  Shortness of breath. EXAM: CT ANGIOGRAPHY CHEST WITH CONTRAST TECHNIQUE: Multidetector CT imaging of the chest was performed using the standard protocol during bolus administration of intravenous contrast. Multiplanar CT image reconstructions and MIPs were obtained to evaluate the vascular anatomy. CONTRAST:  60 mL of Isovue 370 COMPARISON:  Chest x-ray from earlier today FINDINGS: Cardiovascular: Evaluation is severely limited due to severe respiratory motion. Limited views of the thoracic aorta demonstrate no aneurysm or dissection. The heart size is normal. No coronary artery calcifications are identified. Evaluation of the pulmonary arteries is markedly limited due to respiratory motion. The study is nondiagnostic in the evaluation for pulmonary emboli. Significant stairstep  artifact is identified. Ill defined low-attenuation in the left main pulmonary artery may be artifactual. Pulmonary emboli cannot be confidently diagnosis or excluded on this study. Mediastinum/Nodes: The esophagus is fluid-filled along much of its length and it is thick walled over its distal third. No mediastinal air is noted. No pleural or pericardial effusions. No adenopathy. Lungs/Pleura: Evaluation is markedly limited due to severe respiratory motion. Central airways are normal. No pneumothorax. No evidence of pneumonia or aspiration. A small nodule in the posterior left upper lobe on series 11, image 19 is not excluded measuring 4 mm. This is of doubtful significance in a patient this age. No other nodules. No masses. Upper Abdomen: The stomach appears to be mildly distended within visualize limits. Hepatic steatosis. No other abnormalities are seen in the upper abdomen. Fullness of the right renal collecting system is not excluded on limited images. Musculoskeletal: No chest wall abnormality. No acute or significant osseous findings. Review of the MIP images confirms the above findings. IMPRESSION: 1. The study is nondiagnostic in the evaluation for pulmonary emboli due to severe respiratory artifact. Pulmonary emboli cannot be confidently diagnosed or excluded. Ill defined low-attenuation in the left main pulmonary artery is nonspecific but could be artifactual. If there is continued concern for pulmonary emboli, a repeat study when the patient is breathing less rapidly or a V/Q scan may help with further evaluation. 2. The esophagus is fluid-filled and thick walled suggesting esophagitis. This could potentially be the cause of the patient's symptoms. Direct visualization could better evaluate. 3. Possible tiny nodule in the left upper lobe of doubtful significance given patient age. Electronically Signed   By: Dorise Bullion III M.D   On: 07/15/2016 09:57    EKG: Independently reviewed. Sinus  tachycardia  Assessment/Plan Active Problems:   Uncontrolled type 1 diabetes mellitus (HCC)   Sinus tachycardia   DKA (diabetic ketoacidoses) (HCC)   Moderate to severe DKA with history of type 1 diabetes -  Unsure of the exact etiology of why patient is in DKA. Has history of medical condition noncompliance -Patient on insulin drip per DKA protocol. Accu-Cheks every one hour -Appears to be in severe metabolic acidosis given her pH is 6.8 -We will give her 1 amp of bicarbonate and started on bicarbonate drip as well -IV fluids, monitor urine output -Check TSH, UA and drug screen neurochecks -Check hemoglobin A1c -Nothing by mouth at this time, antiemetics if needed -No need for CT head at this time -Closely monitor electrolytes. Check BMP every 4 hours. Repeat ABG at 2 PM -Protecting airway, no need for intubation at the moment. -Antibiotics as needed -Home regimen: Lantus 12 units at bedtime and sliding scale  Hyperkalemia -No obvious EKG changes noted. We'll recheck labs -She improved with insulin drip and his DKA improves  Shortness of breath/tachypnea -Chest x-ray appears to be unremarkable. Don't think this is Asthma aspiration -D-dimer done in the ED was slightly elevated therefore CTA of the chest was done but due to poor study PE cannot be ruled out. Showed signs of esophagitis -I doubt patient has pulmonary embolism as she is saturating 100%. This is likely consequences of severe metabolic acidosis trying to compensate -Daily pulse ox monitoring  Sinus tachycardia -Likely secondary to severe acidosis. Continue to monitor. Should result as acidosis improves  Hyponatremia -Suspect pseudohyponatremia. Continue to monitor sodium levels  Acute kidney injury -Likely prerenal in nature -Continue IV fluids, monitor urine output and trend creatinine  Leukocytosis -Most likely reactive. No signs of active infection. Check a UA -No need for antibiotics and trend  WBC  History of diabetic gastroparesis -She remains nothing by mouth. Can restart her home medications when able to take oral meds and tolerating oral diet -Previous gastric empty study showed delayed gastric emptying  History of essential hypertension -Nothing by mouth at this time. Will treat with IV medications if needed -Can continue oral meds when able  History of asthma -Continue nebulizers  History of GERD -PPI when able to take oral  DVT prophylaxis: Subcutaneous heparin Code Status: Full Family Communication: Spoke with Mother [name] over the phone Disposition Plan: To be determined Consults called: Diabetic coordinator Admission status: [place] time spent: 40 minutes    Jenea Dake Arsenio Loader MD [facility]   If 7PM-7AM, please contact night-coverage [email or web address] Password [email or web address]  07/15/2016, 11:30 AM

## 2016-07-15 NOTE — ED Notes (Signed)
Pt c/o SOB, chest pain, since waking up around 5AM this morning. Denies NVD.

## 2016-07-15 NOTE — ED Notes (Signed)
K+ of6.5 and Glucose of 593 given to [facility]

## 2016-07-15 NOTE — ED Notes (Addendum)
Patient offered tylenol. Patient refused. Patient requesting "morphine or dilaudid." Stating "tylenol doesn't work." Hospitalist made aware.

## 2016-07-15 NOTE — ED Provider Notes (Signed)
[facility] DEPT Provider Note   CSN: [id number] Arrival date & time: 07/15/16  7846     History   Chief Complaint Chief Complaint  Patient presents with  . Shortness of Breath  . Chest Pain    HPI [name] is a 26 y.o. female.  HPI  This is a 26 yo female with a history of diabetes, gastroparesis, asthma who presents with shortness of breath and chest pain. She reports that she went to bed feeling fine last night. She woke up with sharp chest pain that is anterior. She rates her pain a 10 out of 10. She's not had pain like this before. She states that it does not feel like her asthma. Denies any recent fevers or cough.  As any abdominal pain, nausea, vomiting. Reports that she took her insulin last night as directed.  She denies history of blood clots, calf tenderness, recent surgeries or hospitalizations.  Past Medical History:  Diagnosis Date  . Anxiety   . Arthritis   . Asthma   . Diabetes mellitus 2007   IDDM.  poorly controlled, multiple admits with DKA  . Gallstones   . Gastroparesis   . Heart murmur   . Hepatic steatosis 11/26/2014   and hepatomegaly  . Hypertension    NOT CURRENTLY ON ANY BP MED  . Liver mass 11/26/2014  . Pancreatitis, acute 11/26/2014    Patient Active Problem List   Diagnosis Date Noted  . Thrombocytosis ([place]) 07/08/2016  . Candiduria, asymptomatic 06/23/2016  . Hyponatremia 06/13/2016  . Hematuria 05/13/2016  . UTI (urinary tract infection) 01/22/2016  . Diarrhea 11/09/2015  . Acute urinary retention   . GERD (gastroesophageal reflux disease) 07/10/2015  . Depression with anxiety 07/10/2015  . Gastroparesis 06/22/2015  . Altered mental status 06/22/2015  . Volume depletion 06/10/2015  . Protein-calorie malnutrition, severe 06/10/2015  . Hyperglycemia   . Elevated bilirubin   . Hematemesis with nausea   . Intractable nausea and vomiting 05/28/2015  . Abdominal pain in female   . Abdominal pain 05/24/2015  .  Hypertension 05/24/2015  . Dehydration   . Chronic diastolic heart failure ([place]) 04/11/2015  . Hematemesis 04/08/2015  . DKA (diabetic ketoacidoses) ([place]) 03/22/2015  . S/P laparoscopic cholecystectomy 02/11/2015  . Postextubation stridor   . Pancreatitis, acute 11/26/2014  . Volume overload 11/26/2014  . Hypokalemia 11/26/2014  . Hepatic steatosis 11/26/2014  . Liver mass 11/26/2014  . Sepsis ([place]) 11/25/2014  . Sinus tachycardia 11/25/2014  . Hypomagnesemia 11/25/2014  . Hypophosphatemia 11/25/2014  . Elevated LFTs 11/24/2014  . Acute kidney injury ([place]) 11/24/2014  . Migraine headache 11/24/2014  . Asthma 06/29/2012  . Uncontrolled type 1 diabetes mellitus ([place]) 06/19/2010  . Goiter, unspecified 06/19/2010    Past Surgical History:  Procedure Laterality Date  . CHOLECYSTECTOMY N/A 02/11/2015   Procedure: LAPAROSCOPIC CHOLECYSTECTOMY WITH INTRAOPERATIVE CHOLANGIOGRAM;  Surgeon: Greer Pickerel, MD;  Location: [facility] ORS;  Service: General;  Laterality: N/A;  . ESOPHAGOGASTRODUODENOSCOPY (EGD) WITH PROPOFOL Left 09/20/2014   Procedure: ESOPHAGOGASTRODUODENOSCOPY (EGD) WITH PROPOFOL;  Surgeon: Arta Silence, MD;  Location: [facility] ENDOSCOPY;  Service: Endoscopy;  Laterality: Left;  . WISDOM TOOTH EXTRACTION      OB History    Gravida Para Term Preterm AB Living   2 1 0 1 1 1    SAB TAB Ectopic Multiple Live Births   0 1 0 0 1       Home Medications    Prior to Admission medications   Medication Sig Start Date  End Date Taking? Authorizing Provider  albuterol (PROVENTIL HFA;VENTOLIN HFA) 108 (90 BASE) MCG/ACT inhaler Inhale 2 puffs into the lungs every 6 (six) hours as needed for wheezing or shortness of breath.    Yes [provider]  albuterol (PROVENTIL) (2.5 MG/3ML) 0.083% nebulizer solution Take 2.5 mg by nebulization every 6 (six) hours as needed for wheezing or shortness of breath.    Yes [provider]  budesonide-formoterol (SYMBICORT) 160-4.5 MCG/ACT inhaler  Inhale 2 puffs into the lungs 2 (two) times daily.    Yes [provider]  dicyclomine (BENTYL) 20 MG tablet Take 1 tablet (20 mg total) by mouth 2 (two) times daily. 07/13/16  Yes Volanda Napoleon, PA-C  erythromycin (E-MYCIN) 250 MG tablet Take 1 tablet (250 mg total) by mouth 3 (three) times daily with meals. 07/11/16  Yes Sheikh, Omair Latif, DO  glucagon (GLUCAGON EMERGENCY) 1 MG injection Inject 1 mg into the vein once as needed. 06/14/16  Yes Sheikh, Omair Latif, DO  insulin aspart (NOVOLOG FLEXPEN) 100 UNIT/ML FlexPen Inject into the skin See admin instructions. Pt uses 1 unit for every 10 grams of carbs with meals and snacks for BS greater than 50.   Yes [provider]  insulin glargine (LANTUS) 100 UNIT/ML injection Inject 0.22 mLs (22 Units total) into the skin daily. 06/28/16  Yes Hosie Poisson, MD  metoprolol tartrate (LOPRESSOR) 25 MG tablet Take 0.5 tablets (12.5 mg total) by mouth 2 (two) times daily. 05/10/16  Yes Mariel Aloe, MD  pantoprazole (PROTONIX) 40 MG tablet Take 1 tablet (40 mg total) by mouth 2 (two) times daily. 02/25/16  Yes Regalado, Belkys A, MD  Vitamin D, Ergocalciferol, (DRISDOL) 50000 units CAPS capsule Take 50,000 Units by mouth once a week. 05/26/16  Yes [provider]  ondansetron (ZOFRAN) 4 MG tablet Take 1 tablet (4 mg total) by mouth every 6 (six) hours. 07/13/16   Volanda Napoleon, PA-C  promethazine (PHENERGAN) 25 MG tablet Take 1 tablet (25 mg total) by mouth every 6 (six) hours as needed for nausea or vomiting. 06/15/16   Deno Etienne, DO    Family History Family History  Problem Relation Age of Onset  . Heart disease Maternal Grandmother   . Heart disease Maternal Grandfather   . Diabetes Mother   . Hyperlipidemia Mother   . Hypertension Father   . Heart disease Father   . Hypertension Paternal Grandmother   . Cancer Paternal Grandfather     Social History Social History  Substance Use Topics  . Smoking status: Never  Smoker  . Smokeless tobacco: Never Used  . Alcohol use No     Allergies   Peanut-containing drug products; Food; and Ultram [tramadol]   Review of Systems Review of Systems  Constitutional: Negative for fever.  Respiratory: Positive for shortness of breath. Negative for cough.   Cardiovascular: Positive for chest pain.  Gastrointestinal: Negative for abdominal pain, diarrhea, nausea and vomiting.  Genitourinary: Negative for dysuria.  All other systems reviewed and are negative.    Physical Exam Updated Vital Signs BP 119/70 (BP Location: Left Arm)   Pulse (!) 131   Temp 97.7 F (36.5 C) (Oral)   Resp (!) 34   LMP  (Approximate)   SpO2 100%   Physical Exam  Constitutional: She is oriented to person, place, and time.  Ill-appearing  HENT:  Head: Normocephalic and atraumatic.  Mucous membranes dry  Cardiovascular: Regular rhythm and normal heart sounds.   No murmur heard.  Tachycardia  Pulmonary/Chest: Effort normal and breath sounds normal. No respiratory distress. She has no wheezes. She exhibits no tenderness.  Increased respiratory rate, [place] breathing noted  Abdominal: Soft. Bowel sounds are normal. There is no tenderness. There is no guarding.  Musculoskeletal: She exhibits no edema.  Neurological: She is alert and oriented to person, place, and time.  Skin: Skin is warm and dry.  Psychiatric: She has a normal mood and affect.  Nursing note and vitals reviewed.    ED Treatments / Results  Labs (all labs ordered are listed, but only abnormal results are displayed) Labs Reviewed  CBG MONITORING, ED - Abnormal; Notable for the following:       Result Value   Glucose-Capillary 389 (*)    All other components within normal limits  I-STAT CHEM 8, ED - Abnormal; Notable for the following:    Sodium 130 (*)    Potassium 6.2 (*)    BUN 24 (*)    Glucose, Bld 596 (*)    Hemoglobin 15.3 (*)    All other components within normal limits  CBC WITH  DIFFERENTIAL/PLATELET  D-DIMER, QUANTITATIVE (NOT AT [facility])  BLOOD GAS, VENOUS  I-STAT TROPOININ, ED    EKG  EKG Interpretation  Date/Time:  Wednesday July 15 2016 05:29:14 EDT Ventricular Rate:  141 PR Interval:    QRS Duration: 80 QT Interval:  293 QTC Calculation: 449 R Axis:   76 Text Interpretation:  Sinus tachycardia LAE, consider biatrial enlargement Nonspecific ST and T wave abnormality No significant change since last tracing Confirmed by Thayer Jew [phone]) on 07/15/2016 5:50:28 AM       Radiology Dg Chest 2 View  Result Date: 07/15/2016 CLINICAL DATA:  New onset midsternal chest pain and dyspnea. Tachycardia this morning. EXAM: CHEST  2 VIEW COMPARISON:  06/03/2016 FINDINGS: The lungs are clear. The pulmonary vasculature is normal. Heart size is normal. Hilar and mediastinal contours are unremarkable. There is no pleural effusion. IMPRESSION: No active cardiopulmonary disease. Electronically Signed   By: Andreas Newport M.D.   On: 07/15/2016 06:12    Procedures Procedures (including critical care time)  CRITICAL CARE Performed by: Merryl Hacker   Total critical care time: 35 minutes  Critical care time was exclusive of separately billable procedures and treating other patients.  Critical care was necessary to treat or prevent imminent or life-threatening deterioration.  Critical care was time spent personally by me on the following activities: development of treatment plan with patient and/or surrogate as well as nursing, discussions with consultants, evaluation of patient's response to treatment, examination of patient, obtaining history from patient or surrogate, ordering and performing treatments and interventions, ordering and review of laboratory studies, ordering and review of radiographic studies, pulse oximetry and re-evaluation of patient's condition.   Angiocath insertion Performed by: Merryl Hacker  Consent: Verbal consent  obtained. Risks and benefits: risks, benefits and alternatives were discussed Time out: Immediately prior to procedure a "time out" was called to verify the correct patient, procedure, equipment, support staff and site/side marked as required.  Preparation: Patient was prepped and draped in the usual sterile fashion.  Vein Location: left basillic  Ultrasound Guided  Gauge: 20  Normal blood return and flush without difficulty Patient tolerance: Patient tolerated the procedure well with no immediate complications.     Medications Ordered in ED Medications  sodium chloride 0.9 % bolus 1,000 mL (not administered)  sodium chloride 0.9 % bolus 1,000 mL (not administered)  insulin regular (NOVOLIN  R,HUMULIN R) 100 Units in sodium chloride 0.9 % 100 mL (1 Units/mL) infusion (not administered)  sodium chloride 0.9 % bolus 1,000 mL (not administered)  dextrose 5 %-0.45 % sodium chloride infusion (not administered)     Initial Impression / Assessment and Plan / ED Course  I have reviewed the triage vital signs and the nursing notes.  Pertinent labs & imaging results that were available during my care of the patient were reviewed by me and considered in my medical decision making (see chart for details).     Patient presents with chest pain and shortness of breath. She is ill-appearing on exam. Tachycardia.  Blood sugar 389. She clinically is concerning for DKA. However, she states that chest pain and shortness of breath are not common for her. X-ray is reassuring. EKG shows sinus tachycardia. IV was placed by myself to draw labs. She was started with 2 L of fluid.  7:47 AM Chem-8 with a glucose of 596. Anion gap of 19 with a bicarbonate of 7. Strongly suspicious for DKA. Additional 1 L fluid was ordered. Patient was placed on an insulin drip.  VBG pending.  Additional orders pending. Patient will need admission. Screening d-dimer was obtained given patient's chest pain. However, other  workup so far is reassuring.   Final Clinical Impressions(s) / ED Diagnoses   Final diagnoses:  Diabetic ketoacidosis without coma associated with type 1 diabetes mellitus ([place])  Other chest pain    New Prescriptions New Prescriptions   No medications on file     Merryl Hacker, MD 07/15/16 [phone]

## 2016-07-15 NOTE — ED Notes (Signed)
Respiratory at bedside.

## 2016-07-16 LAB — RAPID URINE DRUG SCREEN, HOSP PERFORMED
AMPHETAMINES: NOT DETECTED
BENZODIAZEPINES: NOT DETECTED
Barbiturates: NOT DETECTED
COCAINE: NOT DETECTED
OPIATES: NOT DETECTED
TETRAHYDROCANNABINOL: NOT DETECTED

## 2016-07-16 LAB — BASIC METABOLIC PANEL
Anion gap: 9 (ref 5–15)
Anion gap: 11 (ref 5–15)
BUN: 19 mg/dL (ref 6–20)
BUN: 16 mg/dL (ref 6–20)
CALCIUM: 8.5 mg/dL — AB (ref 8.9–10.3)
CHLORIDE: 108 mmol/L (ref 101–111)
CO2: 18 mmol/L — AB (ref 22–32)
CO2: 19 mmol/L — ABNORMAL LOW (ref 22–32)
CREATININE: 0.57 mg/dL (ref 0.44–1.00)
Calcium: 8.9 mg/dL (ref 8.9–10.3)
Chloride: 106 mmol/L (ref 101–111)
Creatinine, Ser: 0.6 mg/dL (ref 0.44–1.00)
GFR calc Af Amer: >60 mL/min (ref >60)
GFR calc non Af Amer: >60 mL/min (ref >60)
GFR calc non Af Amer: >60 mL/min (ref >60)
Glucose, Bld: 144 mg/dL — ABNORMAL HIGH (ref 65–99)
Glucose, Bld: 140 mg/dL — ABNORMAL HIGH (ref 65–99)
Potassium: 3 mmol/L — ABNORMAL LOW (ref 3.5–5.1)
Potassium: 3.1 mmol/L — ABNORMAL LOW (ref 3.5–5.1)
Sodium: 135 mmol/L (ref 135–145)
Sodium: 136 mmol/L (ref 135–145)

## 2016-07-16 LAB — URINALYSIS, ROUTINE W REFLEX MICROSCOPIC
Bilirubin Urine: NEGATIVE
Glucose, UA: NEGATIVE mg/dL
Ketones, ur: 80 mg/dL — AB
NITRITE: NEGATIVE
PROTEIN: 30 mg/dL — AB
SPECIFIC GRAVITY, URINE: 1.011 (ref 1.005–1.030)
SQUAMOUS EPITHELIAL / LPF: NONE SEEN
pH: 6 (ref 5.0–8.0)

## 2016-07-16 LAB — GLUCOSE, CAPILLARY
GLUCOSE-CAPILLARY: 128 mg/dL — AB (ref 65–99)
GLUCOSE-CAPILLARY: 143 mg/dL — AB (ref 65–99)
GLUCOSE-CAPILLARY: 201 mg/dL — AB (ref 65–99)
GLUCOSE-CAPILLARY: 198 mg/dL — AB (ref 65–99)
GLUCOSE-CAPILLARY: 162 mg/dL — AB (ref 65–99)
Glucose-Capillary: 129 mg/dL — ABNORMAL HIGH (ref 65–99)
Glucose-Capillary: 133 mg/dL — ABNORMAL HIGH (ref 65–99)
Glucose-Capillary: 133 mg/dL — ABNORMAL HIGH (ref 65–99)
Glucose-Capillary: 142 mg/dL — ABNORMAL HIGH (ref 65–99)
Glucose-Capillary: 154 mg/dL — ABNORMAL HIGH (ref 65–99)
Glucose-Capillary: 156 mg/dL — ABNORMAL HIGH (ref 65–99)
Glucose-Capillary: 156 mg/dL — ABNORMAL HIGH (ref 65–99)
Glucose-Capillary: 192 mg/dL — ABNORMAL HIGH (ref 65–99)

## 2016-07-16 LAB — MAGNESIUM: Magnesium: 1.2 mg/dL — ABNORMAL LOW (ref 1.7–2.4)

## 2016-07-16 MED ORDER — PROMETHAZINE HCL 25 MG/ML IJ SOLN
6.2500 mg | Freq: Four times a day (QID) | INTRAMUSCULAR | Status: DC | PRN
  Administered 2016-07-16 – 2016-07-18 (×4): 6.25 mg via INTRAVENOUS
  Filled 2016-07-16 (×4): qty 1

## 2016-07-16 MED ORDER — POTASSIUM CHLORIDE CRYS ER 20 MEQ PO TBCR
40.0000 meq | EXTENDED_RELEASE_TABLET | Freq: Two times a day (BID) | ORAL | Status: DC
  Administered 2016-07-18: 40 meq via ORAL
  Filled 2016-07-16 (×5): qty 2

## 2016-07-16 MED ORDER — ONDANSETRON HCL 4 MG/2ML IJ SOLN
4.0000 mg | Freq: Four times a day (QID) | INTRAMUSCULAR | Status: DC
  Administered 2016-07-16 – 2016-07-18 (×9): 4 mg via INTRAVENOUS
  Filled 2016-07-16 (×9): qty 2

## 2016-07-16 MED ORDER — MORPHINE SULFATE (PF) 2 MG/ML IV SOLN
1.0000 mg | INTRAVENOUS | Status: AC | PRN
Start: 2016-07-16 — End: 2016-07-16
  Administered 2016-07-16 (×3): 1 mg via INTRAVENOUS
  Filled 2016-07-16 (×3): qty 1

## 2016-07-16 MED ORDER — INSULIN ASPART 100 UNIT/ML ~~LOC~~ SOLN
0.0000 [IU] | Freq: Three times a day (TID) | SUBCUTANEOUS | Status: DC
Start: 2016-07-16 — End: 2016-07-18
  Administered 2016-07-16 – 2016-07-17 (×3): 3 [IU] via SUBCUTANEOUS
  Administered 2016-07-17: 11 [IU] via SUBCUTANEOUS
  Administered 2016-07-18: 8 [IU] via SUBCUTANEOUS

## 2016-07-16 MED ORDER — DEXTROSE 5 % IV SOLN
1.0000 g | INTRAVENOUS | Status: DC
  Administered 2016-07-16 – 2016-07-17 (×2): 1 g via INTRAVENOUS
  Filled 2016-07-16 (×3): qty 10

## 2016-07-16 MED ORDER — POTASSIUM CHLORIDE IN NACL 40-0.9 MEQ/L-% IV SOLN
INTRAVENOUS | Status: DC
  Administered 2016-07-16 – 2016-07-17 (×3): 125 mL/h via INTRAVENOUS
  Filled 2016-07-16 (×3): qty 1000

## 2016-07-16 MED ORDER — MAGNESIUM SULFATE 4 GM/100ML IV SOLN
4.0000 g | Freq: Once | INTRAVENOUS | Status: AC
  Administered 2016-07-16: 4 g via INTRAVENOUS
  Filled 2016-07-16: qty 100

## 2016-07-16 MED ORDER — INSULIN GLARGINE 100 UNIT/ML ~~LOC~~ SOLN
22.0000 [IU] | Freq: Every day | SUBCUTANEOUS | Status: DC
  Administered 2016-07-16 – 2016-07-18 (×3): 22 [IU] via SUBCUTANEOUS
  Filled 2016-07-16 (×3): qty 0.22

## 2016-07-16 MED ORDER — INSULIN ASPART 100 UNIT/ML ~~LOC~~ SOLN
0.0000 [IU] | Freq: Every day | SUBCUTANEOUS | Status: DC
  Administered 2016-07-17: 2 [IU] via SUBCUTANEOUS

## 2016-07-16 MED ORDER — POTASSIUM CHLORIDE 10 MEQ/100ML IV SOLN
10.0000 meq | INTRAVENOUS | Status: AC
  Administered 2016-07-16 (×3): 10 meq via INTRAVENOUS
  Filled 2016-07-16 (×3): qty 100

## 2016-07-16 MED ORDER — OXYCODONE-ACETAMINOPHEN 5-325 MG PO TABS
1.0000 | ORAL_TABLET | ORAL | Status: DC | PRN
  Administered 2016-07-16 – 2016-07-18 (×5): 1 via ORAL
  Filled 2016-07-16 (×5): qty 1

## 2016-07-16 MED ORDER — ENOXAPARIN SODIUM 40 MG/0.4ML ~~LOC~~ SOLN
40.0000 mg | SUBCUTANEOUS | Status: DC
  Administered 2016-07-16 – 2016-07-17 (×2): 40 mg via SUBCUTANEOUS
  Filled 2016-07-16 (×2): qty 0.4

## 2016-07-16 NOTE — Progress Notes (Signed)
Inpatient Diabetes Program Recommendations  AACE/ADA: New Consensus Statement on Inpatient Glycemic Control (2015)  Target Ranges:  Prepandial:   less than 140 mg/dL      Peak postprandial:   less than 180 mg/dL (1-2 hours)      Critically ill patients:  140 - 180 mg/dL   Lab Results  Component Value Date   GLUCAP 192 (H) 07/16/2016   HGBA1C 8.3 (H) 06/05/2016    Review of Glycemic Control  RN states pt has been withdrawn most of day. Now lying in fetal position and resting.  11th admission and 5 ED visits in past 6 months. Consider psych consult for depression. Appears pt cannot take care of herself at home as evidenced by repeated hospital admissions. Pt states she takes her insulin as directed. Have spoken to Mom who is concerned about pt's mental health.  Will follow.  Thank you. Lorenda Peck, RD, LDN, CDE Inpatient Diabetes Coordinator [phone]

## 2016-07-16 NOTE — Progress Notes (Signed)
Inpatient Diabetes Program Recommendations  AACE/ADA: New Consensus Statement on Inpatient Glycemic Control (2015)  Target Ranges:  Prepandial:   less than 140 mg/dL      Peak postprandial:   less than 180 mg/dL (1-2 hours)      Critically ill patients:  140 - 180 mg/dL   Lab Results  Component Value Date   GLUCAP 198 (H) 07/16/2016   HGBA1C 8.3 (H) 06/05/2016    Review of Glycemic Control  Diabetes history: DM1 Outpatient Diabetes medications: Lantus 22 units QD, Novolog 1:10 CHO ratio Current orders for Inpatient glycemic control: Lantus 22 units QD, Novolog 0-15 units tidwc and hs  MD- Reviewed Endocrinology notes from [facility] (Dr. Hartford Poli with [facility]) from 05/26/16.  Per MD notes, patient was instructed to reduce her Lantus to 12 units QHS and to continue to take her Novolog after meals.  Per notes, taking much less Lantus at home.  Please consider the following:  1. Reduce Lantus to 12 units QHS (home dose)  2. Reduce Novolog SSI to Novolog Sensitive Correction Scale/ SSI (0-9 units) TID AC + HS (currently ordered as Moderate scale 0-15 units tidwc and HS)  3. Start Custom Novolog Meal Coverage: Novolog 1 unit for every 10 grams of Carbohydrates TID with meals  To speak to pt this afternoon.  Thank you. Lorenda Peck, RD, LDN, CDE Inpatient Diabetes Coordinator [phone]

## 2016-07-16 NOTE — Progress Notes (Addendum)
PROGRESS NOTE    [name]  PJA:[id number] DOB: 12/23/1990 DOA: 07/15/2016 PCP: Vicenta Aly, FNP   Brief Narrative: 26 year old female with history of type 1 diabetes, hypertension, diabetic gastroparesis, asthma presented with shortness of breath and not feeling well. On admission patient was severely acidotic with pH of 6.8 consistent with diabetic ketoacidosis. Also with hyperkalemia and acute kidney injury. He started on IV fluid, sodium bicarbonate, insulin drip and admitted to stepdown unit for further evaluation.  Assessment & Plan:  #  Severe diabetic ketoacidosis without coma: Patient with history of type 1 diabetes. Unknown if it is triggered by UTI. -Anion gap improved with improvement in blood sugar level. -phase 2 DKA treatment protocol ordered with Lantus, sliding scale. Changed to IV fluid with potassium chloride. -Diabetic educator referral -Mental status good. -Continue to monitor labs, blood sugar level.  -f/u a1c  #Hyperkalemia and hypomagnesemia in the setting of DKA. Serum potassium level improved and now patient is hypokalemic. Repleted for gram of magnesium sulfate today. Monitor labs.  #Acute kidney injury in the setting of DKA. Serum creatinine level improved. Continue to monitor. Continue IV hydration.  # UA with UTI: Patient with generalized body pain and not feeling well. Ordered urine culture. Patient also with leukocytosis. Will start empiric IV ceftriaxone.  #Shortness of breath likely in the setting of severe acidosis. CT chest will negative for PE.  #Possible esophagitis causing nausea vomiting. Patient also with history of diabetic gastroparesis: Continue supportive care, IV fluid, Protonix, erythormycin. -start diet  # Sinus tachycardia in the setting of DKA/acidosis and dehydration. Continue IV fluid and treatment as above. Also on metoprolol  #Hyponatremia: Improved  # generalized body pain likely due to severe acidosis and  electrolytes imbalance:ordered iv and oral pain medication.  Active Problems:   Uncontrolled type 1 diabetes mellitus ([place])   Sinus tachycardia   DKA (diabetic ketoacidoses) ([place])  DVT prophylaxis:Lovenox sq Code Status:full code Family Communication: No family at bedside Disposition Plan:    Consultants:   None  Procedures:none Antimicrobials:ceftriaxone since 6/27  Subjective: Seen and examined at bedside. Reported generalized body pain associated with nausea and vomiting episode. Denied chest pain, shortness of breath today. No headache or dizziness.   Objective: Vitals:   07/16/16 0900 07/16/16 1000 07/16/16 1100 07/16/16 1200  BP:  (!) 155/107    Pulse: (!) 124 (!) 114 (!) 124   Resp: 20 15 (!) 22   Temp:    98.7 F (37.1 C)  TempSrc:    Oral  SpO2: 98% 99% 99%   Weight:      Height:        Intake/Output Summary (Last 24 hours) at 07/16/16 1241 Last data filed at 07/16/16 0700  Gross per 24 hour  Intake          3515.26 ml  Output             1150 ml  Net          2365.26 ml   Filed Weights   07/15/16 1730  Weight: 50.2 kg (110 lb 10.7 oz)    Examination:  General exam: Ill-looking female lying in bed Respiratory system: Clear to auscultation. Respiratory effort normal. No wheezing or crackle Cardiovascular system: Regular tachycardic.  No pedal edema. Gastrointestinal system: Abdomen is nondistended, soft and nontender. Normal bowel sounds heard. Central nervous system: Alert awake and oriented. Extremities: Symmetric 5 x 5 power. Skin: No rashes, lesions or ulcers      Data Reviewed: I have  personally reviewed following labs and imaging studies  CBC:  Recent Labs Lab 07/10/16 0525 07/11/16 0536 07/13/16 1344 07/13/16 2121 07/15/16 0715 07/15/16 0741 07/15/16 1750  WBC 5.3 5.5 9.2  --  20.5*  --  13.1*  NEUTROABS 2.6 2.8  --   --  15.9*  --   --   HGB 10.9* 9.8* 14.1 12.6 13.2 15.3* 15.6*  HCT 33.2* 29.3* 41.5 37.0 41.4 45.0 45.2    MCV 88.3 87.5 87.2  --  91.8  --  86.6  PLT 357 367 538*  --  640*  --  326   Basic Metabolic Panel:  Recent Labs Lab 07/10/16 0525 07/11/16 0536  07/15/16 1215 07/15/16 1750 07/15/16 1751 07/15/16 2229 07/16/16 0300 07/16/16 0746 07/16/16 0752  NA 139 137  < > 134*  --  134* 133* 135  --  136  K 3.4* 3.9  < > 5.2*  --  4.4 3.4* 3.0*  --  3.1*  CL 110 107  < > 107  --  112* 109 108  --  106  CO2 26 26  < > <7*  --  8* 15* 18*  --  19*  GLUCOSE 179* 260*  < > 375*  --  174* 162* 144*  --  140*  BUN 10 5*  < > 29*  --  23* 20 19  --  16  CREATININE 0.45 0.43*  < > 1.31* 0.84 0.80 0.56 0.57  --  0.60  CALCIUM 8.2* 8.4*  < > 9.0  --  8.7* 8.6* 8.5*  --  8.9  MG 1.6* 1.6*  --   --   --   --   --   --  1.2*  --   PHOS 3.6 3.3  --   --   --   --   --   --   --   --   < > = values in this interval not displayed. GFR: Estimated Creatinine Clearance: 84.5 mL/min (by C-G formula based on SCr of 0.6 mg/dL). Liver Function Tests:  Recent Labs Lab 07/10/16 0525 07/11/16 0536 07/13/16 1344  AST 20 16 20   ALT 12* 12* 12*  ALKPHOS 73 77 113  BILITOT 0.5 0.4 0.7  PROT 5.3* 5.3* 8.5*  ALBUMIN 2.9* 3.0* 4.8    Recent Labs Lab 07/13/16 1344  LIPASE <10*   No results for input(s): AMMONIA in the last 168 hours. Coagulation Profile: No results for input(s): INR, PROTIME in the last 168 hours. Cardiac Enzymes: No results for input(s): CKTOTAL, CKMB, CKMBINDEX, TROPONINI in the last 168 hours. BNP (last 3 results) No results for input(s): PROBNP in the last 8760 hours. HbA1C: No results for input(s): HGBA1C in the last 72 hours. CBG:  Recent Labs Lab 07/16/16 0648 07/16/16 0736 07/16/16 0913 07/16/16 1037 07/16/16 1131  GLUCAP 142* 133* 143* 201* 198*   Lipid Profile: No results for input(s): CHOL, HDL, LDLCALC, TRIG, CHOLHDL, LDLDIRECT in the last 72 hours. Thyroid Function Tests:  Recent Labs  07/15/16 1215  TSH 0.803   Anemia Panel: No results for input(s):  VITAMINB12, FOLATE, FERRITIN, TIBC, IRON, RETICCTPCT in the last 72 hours. Sepsis Labs: No results for input(s): PROCALCITON, LATICACIDVEN in the last 168 hours.  Recent Results (from the past 240 hour(s))  MRSA PCR Screening     Status: None   Collection Time: 07/15/16  5:53 PM  Result Value Ref Range Status   MRSA by PCR NEGATIVE NEGATIVE Final  Comment:        The GeneXpert MRSA Assay (FDA approved for NASAL specimens only), is one component of a comprehensive MRSA colonization surveillance program. It is not intended to diagnose MRSA infection nor to guide or monitor treatment for MRSA infections.          Radiology Studies: Dg Chest 2 View  Result Date: 07/15/2016 CLINICAL DATA:  New onset midsternal chest pain and dyspnea. Tachycardia this morning. EXAM: CHEST  2 VIEW COMPARISON:  06/03/2016 FINDINGS: The lungs are clear. The pulmonary vasculature is normal. Heart size is normal. Hilar and mediastinal contours are unremarkable. There is no pleural effusion. IMPRESSION: No active cardiopulmonary disease. Electronically Signed   By: Andreas Newport M.D.   On: 07/15/2016 06:12   Ct Angio Chest Pe W/cm &/or Wo Cm  Result Date: 07/15/2016 CLINICAL DATA:  Shortness of breath. EXAM: CT ANGIOGRAPHY CHEST WITH CONTRAST TECHNIQUE: Multidetector CT imaging of the chest was performed using the standard protocol during bolus administration of intravenous contrast. Multiplanar CT image reconstructions and MIPs were obtained to evaluate the vascular anatomy. CONTRAST:  60 mL of Isovue 370 COMPARISON:  Chest x-ray from earlier today FINDINGS: Cardiovascular: Evaluation is severely limited due to severe respiratory motion. Limited views of the thoracic aorta demonstrate no aneurysm or dissection. The heart size is normal. No coronary artery calcifications are identified. Evaluation of the pulmonary arteries is markedly limited due to respiratory motion. The study is nondiagnostic in the  evaluation for pulmonary emboli. Significant stairstep artifact is identified. Ill defined low-attenuation in the left main pulmonary artery may be artifactual. Pulmonary emboli cannot be confidently diagnosis or excluded on this study. Mediastinum/Nodes: The esophagus is fluid-filled along much of its length and it is thick walled over its distal third. No mediastinal air is noted. No pleural or pericardial effusions. No adenopathy. Lungs/Pleura: Evaluation is markedly limited due to severe respiratory motion. Central airways are normal. No pneumothorax. No evidence of pneumonia or aspiration. A small nodule in the posterior left upper lobe on series 11, image 19 is not excluded measuring 4 mm. This is of doubtful significance in a patient this age. No other nodules. No masses. Upper Abdomen: The stomach appears to be mildly distended within visualize limits. Hepatic steatosis. No other abnormalities are seen in the upper abdomen. Fullness of the right renal collecting system is not excluded on limited images. Musculoskeletal: No chest wall abnormality. No acute or significant osseous findings. Review of the MIP images confirms the above findings. IMPRESSION: 1. The study is nondiagnostic in the evaluation for pulmonary emboli due to severe respiratory artifact. Pulmonary emboli cannot be confidently diagnosed or excluded. Ill defined low-attenuation in the left main pulmonary artery is nonspecific but could be artifactual. If there is continued concern for pulmonary emboli, a repeat study when the patient is breathing less rapidly or a V/Q scan may help with further evaluation. 2. The esophagus is fluid-filled and thick walled suggesting esophagitis. This could potentially be the cause of the patient's symptoms. Direct visualization could better evaluate. 3. Possible tiny nodule in the left upper lobe of doubtful significance given patient age. Electronically Signed   By: Dorise Bullion III M.D   On: 07/15/2016  09:57        Scheduled Meds: . acetaminophen  650 mg Oral Once  . dicyclomine  20 mg Oral BID  . erythromycin  250 mg Oral TID WC  . heparin  5,000 Units Subcutaneous Q8H  . insulin aspart  0-15 Units  Subcutaneous TID WC  . insulin aspart  0-5 Units Subcutaneous QHS  . insulin glargine  22 Units Subcutaneous Daily  . metoprolol tartrate  12.5 mg Oral BID  . mometasone-formoterol  2 puff Inhalation BID  . ondansetron (ZOFRAN) IV  4 mg Intravenous Q6H  . pantoprazole  40 mg Oral BID  . potassium chloride  40 mEq Oral BID  . Vitamin D (Ergocalciferol)  50,000 Units Oral Weekly   Continuous Infusions: . 0.9 % NaCl with KCl 40 mEq / L 125 mL/hr (07/16/16 0945)  . cefTRIAXone (ROCEPHIN)  IV    . insulin (NOVOLIN-R) infusion Stopped (07/16/16 1154)  . magnesium sulfate 1 - 4 g bolus IVPB Stopped (07/16/16 1400)     LOS: 1 day    Roselia Snipe Tanna Furry, MD [facility] Pager [phone]  If 7PM-7AM, please contact night-coverage [email or web address] Password [facility] 07/16/2016, 12:41 PM

## 2016-07-17 LAB — BASIC METABOLIC PANEL
ANION GAP: 11 (ref 5–15)
BUN: 8 mg/dL (ref 6–20)
CHLORIDE: 109 mmol/L (ref 101–111)
CO2: 16 mmol/L — ABNORMAL LOW (ref 22–32)
Calcium: 8.4 mg/dL — ABNORMAL LOW (ref 8.9–10.3)
Creatinine, Ser: 0.57 mg/dL (ref 0.44–1.00)
GFR calc Af Amer: >60 mL/min (ref >60)
GLUCOSE: 253 mg/dL — AB (ref 65–99)
POTASSIUM: 4.4 mmol/L (ref 3.5–5.1)
Sodium: 136 mmol/L (ref 135–145)

## 2016-07-17 LAB — GLUCOSE, CAPILLARY
GLUCOSE-CAPILLARY: 156 mg/dL — AB (ref 65–99)
Glucose-Capillary: 330 mg/dL — ABNORMAL HIGH (ref 65–99)
Glucose-Capillary: 91 mg/dL (ref 65–99)
Glucose-Capillary: 211 mg/dL — ABNORMAL HIGH (ref 65–99)

## 2016-07-17 LAB — HEMOGLOBIN A1C
HEMOGLOBIN A1C: 7.7 % — AB (ref 4.8–5.6)
MEAN PLASMA GLUCOSE: 174 mg/dL

## 2016-07-17 LAB — CBC
HCT: 31.9 % — ABNORMAL LOW (ref 36.0–46.0)
HEMOGLOBIN: 10.9 g/dL — AB (ref 12.0–15.0)
MCH: 28.6 pg (ref 26.0–34.0)
MCHC: 34.2 g/dL (ref 30.0–36.0)
MCV: 83.7 fL (ref 78.0–100.0)
PLATELETS: 408 10*3/uL — AB (ref 150–400)
RBC: 3.81 MIL/uL — AB (ref 3.87–5.11)
RDW: 13.8 % (ref 11.5–15.5)
WBC: 6.6 10*3/uL (ref 4.0–10.5)

## 2016-07-17 LAB — URINE CULTURE

## 2016-07-17 LAB — MAGNESIUM: Magnesium: 1.5 mg/dL — ABNORMAL LOW (ref 1.7–2.4)

## 2016-07-17 MED ORDER — PROMETHAZINE HCL 25 MG/ML IJ SOLN
12.5000 mg | Freq: Once | INTRAMUSCULAR | Status: AC
  Administered 2016-07-17: 12.5 mg via INTRAVENOUS
  Filled 2016-07-17: qty 1

## 2016-07-17 MED ORDER — MAGNESIUM SULFATE IN D5W 1-5 GM/100ML-% IV SOLN
1.0000 g | Freq: Once | INTRAVENOUS | Status: AC
  Administered 2016-07-17: 1 g via INTRAVENOUS
  Filled 2016-07-17: qty 100

## 2016-07-17 MED ORDER — MORPHINE SULFATE (PF) 2 MG/ML IV SOLN
2.0000 mg | Freq: Once | INTRAVENOUS | Status: AC
  Administered 2016-07-17: 2 mg via INTRAVENOUS
  Filled 2016-07-17: qty 1

## 2016-07-17 MED ORDER — KETOROLAC TROMETHAMINE 30 MG/ML IJ SOLN
30.0000 mg | Freq: Four times a day (QID) | INTRAMUSCULAR | Status: DC | PRN
  Administered 2016-07-17 – 2016-07-18 (×5): 30 mg via INTRAVENOUS
  Filled 2016-07-17 (×6): qty 1

## 2016-07-17 NOTE — Progress Notes (Signed)
   07/17/16 1200  Clinical Encounter Type  Visited With Patient  Visit Type Follow-up;Psychological support;Spiritual support;Critical Care  Referral From Patient  Consult/Referral To Chaplain  Spiritual Encounters  Spiritual Needs Emotional;Other (Comment) (Conversation/Support)  Stress Factors  Patient Stress Factors Health changes;Other (Comment) (Depressed)   I visited with the patient as a follow up to previous encounters and due to a [place] consult. The patient was laying in fetal position and presented with a flat affect.  She was receptive to my visit and stated that she needed someone to talk to.  The patient told me that she is depressed over her health condition and how many times she has had to be in the hospital. She states that she is compliant with her medications and has been trying to find out what sets off her diabetes. Ms. Barth states that her stomach issues cause her problems.  Ms. Loder states that her 65 year old daughter is what gives her hope and has been keeping her going. She is not religious or spiritual; but looks to her daughter as a reason to keep her going.  We talked about getting together some art supplies as something to help her pass the time and focus on something besides being in the hospital.  I am going to bring those to her. I let the charge nurse know about the patient's indication that she is depressed.  I will follow up.  Please, contact Spiritual Care for further assistance.   [place] M.Div.

## 2016-07-17 NOTE — Progress Notes (Signed)
Pt was given oral fluid challenge requested by Dr. Verlon Au.   Tolerated well.  No vomitting, no nausea, cramping.  Will encourage pt to eat a little broth & jello.

## 2016-07-17 NOTE — Progress Notes (Signed)
Pt asking if she can have morphine for pain because "it really helps with the pain". Pt is currently vomiting. Percocet ordered but it is too early to administer. Maryjane Hurter notified of pt's request.

## 2016-07-17 NOTE — Progress Notes (Signed)
Inpatient Diabetes Program Recommendations  AACE/ADA: New Consensus Statement on Inpatient Glycemic Control (2015)  Target Ranges:  Prepandial:   less than 140 mg/dL      Peak postprandial:   less than 180 mg/dL (1-2 hours)      Critically ill patients:  140 - 180 mg/dL   Lab Results  Component Value Date   GLUCAP 330 (H) 07/17/2016   HGBA1C 7.7 (H) 07/16/2016    Review of Glycemic Control  Blood sugar elevated this am. Would not increase Lantus at this point. Will need meal coverage insulin when po intake increases.  Spoke with pt's Mother on phone this morning. Mom states she is very concerned about her daughter's multiple admissions. States her daughter is depressed and is not on antidepressants at this time. She previously was on something for depression/anxiety, and stopped taking it d/t feeling bad.  Mom states pt has flat affect at home. Witnesses pt checking blood sugar and giving insulin at times. Mom states pt would benefit from psych intervention. Mom is willing to speak with MD about same.   Thank you. Lorenda Peck, RD, LDN, CDE Inpatient Diabetes Coordinator [phone]

## 2016-07-17 NOTE — Progress Notes (Signed)
PROGRESS NOTE    [name]  ZOX:[id number] DOB: September 15, 1990 DOA: 07/15/2016 PCP: Vicenta Aly, FNP   Brief Narrative:  26 type 1 diabetes since age 26,  diabetic gastroparesis with multiple hospitalizations, most recently 6/17-6/23 hypertension,  asthma presented with shortness of breath and not feeling well. severely acidotic with pH of 6.8 = diabetic ketoacidosis.  hyperkalemia  acute kidney injury.   IV fluid, sodium bicarbonate, insulin drip and admitted to stepdown unit for further evaluation.  Assessment & Plan:  #  Severe diabetic ketoacidosis without coma:   type 1 diabetes. Unknown if it is triggered by UTI. Probable diabetic gastroparesis  Uncontrolled diabetes mellitus-A1c this admission 7.7  Anion gap improved with improvement in blood sugar level.  Discontinued IV fluid with potassium chloride 6/29 Diabetic educator referral  CBGs are ranging between 142 and 254  Continue Lantus 22 units at bedtime, moderate coverage sliding scale Transferring to telemetry bed Case manager to look in on the patient and figure out why she has been readmitted almost 8 times in 6 months  #Hyperkalemia and hypomagnesemia in the setting of DKA.   Serum potassium level improved and now patient is hypokalemic.   Repleted for gram of magnesium sulfate 6/29 as magnesium 1.2 --> 1.5 , still low mag--giving IV mag 1  gm 6/29  Repeat labs a.m. 6/30  #Acute kidney injury in the setting of DKA.  Serum creatinine level improved. Continue to monitor. Continue IV hydration.  # UA with UTI:   Patient with generalized body pain and not feeling well.   Urine analysis  large leukocytes  WBC 20->13---->6  6/28 urine culture shows multiple colony forming units, given patient having history of fever however  would continue ceftriaxone for now-recommend taper or discontinuation of antibiotics probably on 6/30  #Shortness of breath likely in the setting of severe acidosis.   CT chest will  negative for PE.  #Possible esophagitis causing nausea vomiting.   Continue supportive care, IV fluid,  Protonix, erythormycin.  CIWA score start diet  Would consider discontinuation of Bentyl, addition of Reglan if not eating  If persistently feverish may need H pylori testing?   # Sinus tachycardia in the setting of DKA/acidosis and dehydration.   Continue IV fluid and treatment as above. Also on metoprolol 12.5 twice a day  #Hyponatremia: Improved  # generalized body pain likely due to severe acidosis and electrolytes imbalance   ordered iv and oral pain medication.  Dilutional anemia, borderline microcytic Probable thrombocytosis from hemoconcentration-on smear platelets and clumps  Monitor trend    Active Problems:   Uncontrolled type 1 diabetes mellitus (HCC)   Sinus tachycardia   DKA (diabetic ketoacidoses) ([place])  DVT prophylaxis:Lovenox sq Code Status: HAVING used in patient full code Family Communication: No family at bedside Disposition Plan: Transferring to telemetry bed. Ration has been admitted >8 times in the past 6 months Have asked case manager to look into her case to see what can prevent her from being rehospitalized    Consultants:   None  Procedures:none   Antimicrobials:ceftriaxone since 6/27  Subjective:  Objective: Vitals:   07/17/16 0051 07/17/16 0326 07/17/16 0400 07/17/16 0600  BP:   (!) 153/92 120/76  Pulse:   (!) 117 (!) 116  Resp:   (!) 23 20  Temp: 99.5 F (37.5 C) 98.6 F (37 C)    TempSrc: Oral Oral    SpO2:   96% 96%  Weight:      Height:  Intake/Output Summary (Last 24 hours) at 07/17/16 0719 Last data filed at 07/17/16 0657  Gross per 24 hour  Intake             1500 ml  Output             4050 ml  Net            -2550 ml   Filed Weights   07/15/16 1730  Weight: 50.2 kg (110 lb 10.7 oz)    Examination:   Alert looking about stated age does not appear well EOMI NCAT No JVD no bruit mucosa dry Chest  clinically clear Abdomen slightly tender lower quadrant nonspecific No CVA tenderness No lower extremity edema   Data Reviewed: I have personally reviewed following labs and imaging studies  CBC:  Recent Labs Lab 07/11/16 0536 07/13/16 1344 07/13/16 2121 07/15/16 0715 07/15/16 0741 07/15/16 1750 07/17/16 0330  WBC 5.5 9.2  --  20.5*  --  13.1* 6.6  NEUTROABS 2.8  --   --  15.9*  --   --   --   HGB 9.8* 14.1 12.6 13.2 15.3* 15.6* 10.9*  HCT 29.3* 41.5 37.0 41.4 45.0 45.2 31.9*  MCV 87.5 87.2  --  91.8  --  86.6 83.7  PLT 367 538*  --  640*  --  327 696*   Basic Metabolic Panel:  Recent Labs Lab 07/11/16 0536  07/15/16 1751 07/15/16 2229 07/16/16 0300 07/16/16 0746 07/16/16 0752 07/17/16 0330  NA 137  < > 134* 133* 135  --  136 136  K 3.9  < > 4.4 3.4* 3.0*  --  3.1* 4.4  CL 107  < > 112* 109 108  --  106 109  CO2 26  < > 8* 15* 18*  --  19* 16*  GLUCOSE 260*  < > 174* 162* 144*  --  140* 253*  BUN 5*  < > 23* 20 19  --  16 8  CREATININE 0.43*  < > 0.80 0.56 0.57  --  0.60 0.57  CALCIUM 8.4*  < > 8.7* 8.6* 8.5*  --  8.9 8.4*  MG 1.6*  --   --   --   --  1.2*  --  1.5*  PHOS 3.3  --   --   --   --   --   --   --   < > = values in this interval not displayed. GFR: Estimated Creatinine Clearance: 84.5 mL/min (by C-G formula based on SCr of 0.57 mg/dL). Liver Function Tests:  Recent Labs Lab 07/11/16 0536 07/13/16 1344  AST 16 20  ALT 12* 12*  ALKPHOS 77 113  BILITOT 0.4 0.7  PROT 5.3* 8.5*  ALBUMIN 3.0* 4.8    Recent Labs Lab 07/13/16 1344  LIPASE <10*   No results for input(s): AMMONIA in the last 168 hours. Coagulation Profile: No results for input(s): INR, PROTIME in the last 168 hours. Cardiac Enzymes: No results for input(s): CKTOTAL, CKMB, CKMBINDEX, TROPONINI in the last 168 hours. BNP (last 3 results) No results for input(s): PROBNP in the last 8760 hours. HbA1C:  Recent Labs  07/16/16 0300  HGBA1C 7.7*   CBG:  Recent Labs Lab  07/16/16 0913 07/16/16 1037 07/16/16 1131 07/16/16 1604 07/16/16 2126  GLUCAP 143* 201* 198* 192* 162*   Lipid Profile: No results for input(s): CHOL, HDL, LDLCALC, TRIG, CHOLHDL, LDLDIRECT in the last 72 hours. Thyroid Function Tests:  Recent Labs  07/15/16 1215  TSH 0.803   Anemia Panel: No results for input(s): VITAMINB12, FOLATE, FERRITIN, TIBC, IRON, RETICCTPCT in the last 72 hours. Sepsis Labs: No results for input(s): PROCALCITON, LATICACIDVEN in the last 168 hours.  Recent Results (from the past 240 hour(s))  MRSA PCR Screening     Status: None   Collection Time: 07/15/16  5:53 PM  Result Value Ref Range Status   MRSA by PCR NEGATIVE NEGATIVE Final    Comment:        The GeneXpert MRSA Assay (FDA approved for NASAL specimens only), is one component of a comprehensive MRSA colonization surveillance program. It is not intended to diagnose MRSA infection nor to guide or monitor treatment for MRSA infections.          Radiology Studies: Ct Angio Chest Pe W/cm &/or Wo Cm  Result Date: 07/15/2016 CLINICAL DATA:  Shortness of breath. EXAM: CT ANGIOGRAPHY CHEST WITH CONTRAST TECHNIQUE: Multidetector CT imaging of the chest was performed using the standard protocol during bolus administration of intravenous contrast. Multiplanar CT image reconstructions and MIPs were obtained to evaluate the vascular anatomy. CONTRAST:  60 mL of Isovue 370 COMPARISON:  Chest x-ray from earlier today FINDINGS: Cardiovascular: Evaluation is severely limited due to severe respiratory motion. Limited views of the thoracic aorta demonstrate no aneurysm or dissection. The heart size is normal. No coronary artery calcifications are identified. Evaluation of the pulmonary arteries is markedly limited due to respiratory motion. The study is nondiagnostic in the evaluation for pulmonary emboli. Significant stairstep artifact is identified. Ill defined low-attenuation in the left main pulmonary  artery may be artifactual. Pulmonary emboli cannot be confidently diagnosis or excluded on this study. Mediastinum/Nodes: The esophagus is fluid-filled along much of its length and it is thick walled over its distal third. No mediastinal air is noted. No pleural or pericardial effusions. No adenopathy. Lungs/Pleura: Evaluation is markedly limited due to severe respiratory motion. Central airways are normal. No pneumothorax. No evidence of pneumonia or aspiration. A small nodule in the posterior left upper lobe on series 11, image 19 is not excluded measuring 4 mm. This is of doubtful significance in a patient this age. No other nodules. No masses. Upper Abdomen: The stomach appears to be mildly distended within visualize limits. Hepatic steatosis. No other abnormalities are seen in the upper abdomen. Fullness of the right renal collecting system is not excluded on limited images. Musculoskeletal: No chest wall abnormality. No acute or significant osseous findings. Review of the MIP images confirms the above findings. IMPRESSION: 1. The study is nondiagnostic in the evaluation for pulmonary emboli due to severe respiratory artifact. Pulmonary emboli cannot be confidently diagnosed or excluded. Ill defined low-attenuation in the left main pulmonary artery is nonspecific but could be artifactual. If there is continued concern for pulmonary emboli, a repeat study when the patient is breathing less rapidly or a V/Q scan may help with further evaluation. 2. The esophagus is fluid-filled and thick walled suggesting esophagitis. This could potentially be the cause of the patient's symptoms. Direct visualization could better evaluate. 3. Possible tiny nodule in the left upper lobe of doubtful significance given patient age. Electronically Signed   By: Dorise Bullion III M.D   On: 07/15/2016 09:57   Scheduled Meds: . acetaminophen  650 mg Oral Once  . dicyclomine  20 mg Oral BID  . enoxaparin (LOVENOX) injection  40 mg  Subcutaneous Q24H  . erythromycin  250 mg Oral TID WC  . insulin aspart  0-15 Units Subcutaneous  TID WC  . insulin aspart  0-5 Units Subcutaneous QHS  . insulin glargine  22 Units Subcutaneous Daily  . metoprolol tartrate  12.5 mg Oral BID  . mometasone-formoterol  2 puff Inhalation BID  . ondansetron (ZOFRAN) IV  4 mg Intravenous Q6H  . pantoprazole  40 mg Oral BID  . potassium chloride  40 mEq Oral BID  . Vitamin D (Ergocalciferol)  50,000 Units Oral Weekly   Continuous Infusions: . 0.9 % NaCl with KCl 40 mEq / L 125 mL/hr at 07/17/16 0600  . cefTRIAXone (ROCEPHIN)  IV Stopped (07/16/16 1530)  . insulin (NOVOLIN-R) infusion Stopped (07/16/16 1154)    LOS: 2 days    45 minutes total time  Verneita Griffes, MD [facility]   If 7PM-7AM, please contact night-coverage [email or web address] Password [facility] 07/17/2016, 7:19 AM

## 2016-07-18 LAB — COMPREHENSIVE METABOLIC PANEL
ALK PHOS: 84 U/L (ref 38–126)
ALT: 9 U/L — ABNORMAL LOW (ref 14–54)
ANION GAP: 7 (ref 5–15)
AST: 11 U/L — ABNORMAL LOW (ref 15–41)
Albumin: 3.1 g/dL — ABNORMAL LOW (ref 3.5–5.0)
BILIRUBIN TOTAL: 0.4 mg/dL (ref 0.3–1.2)
BUN: 7 mg/dL (ref 6–20)
CALCIUM: 9 mg/dL (ref 8.9–10.3)
CO2: 23 mmol/L (ref 22–32)
Chloride: 107 mmol/L (ref 101–111)
Creatinine, Ser: 0.58 mg/dL (ref 0.44–1.00)
GFR calc Af Amer: >60 mL/min (ref >60)
Glucose, Bld: 170 mg/dL — ABNORMAL HIGH (ref 65–99)
POTASSIUM: 3.9 mmol/L (ref 3.5–5.1)
Sodium: 137 mmol/L (ref 135–145)
TOTAL PROTEIN: 5.9 g/dL — AB (ref 6.5–8.1)

## 2016-07-18 LAB — GLUCOSE, CAPILLARY
GLUCOSE-CAPILLARY: 111 mg/dL — AB (ref 65–99)
Glucose-Capillary: 256 mg/dL — ABNORMAL HIGH (ref 65–99)

## 2016-07-18 LAB — MAGNESIUM: Magnesium: 1.7 mg/dL (ref 1.7–2.4)

## 2016-07-18 LAB — PHOSPHORUS: PHOSPHORUS: 3.3 mg/dL (ref 2.5–4.6)

## 2016-07-18 MED ORDER — MELOXICAM 7.5 MG PO TABS: 7.5000 mg | ORAL_TABLET | Freq: Every day | ORAL | 0 refills | Status: DC

## 2016-07-18 NOTE — Care Management Note (Signed)
Case Management Note  Patient Details  Name: [name] MRN: [id number] Date of Birth: [birth year]  Subjective/Objective:    Severe DKA                Action/Plan: Discharge Planning: NCM discussed with pt medication, specialist and Diabetes education and nutrition. Pt states she can afford her medications and she does follow up with a specialist. Provided brochure on [facility] to call and arrange appt. Pt declined services at this time.   PCP Vicenta Aly MD  Expected Discharge Date:  07/18/16               Expected Discharge Plan:  Home/Self Care  In-House Referral:  NA  Discharge planning Services  CM Consult  Post Acute Care Choice:  NA Choice offered to:  NA  DME Arranged:  N/A DME Agency:  NA  HH Arranged:  NA HH Agency:  NA  Status of Service:  Completed, signed off  If discussed at [place] of Stay Meetings, dates discussed:    Additional Comments:  Erenest Rasher, RN 07/18/2016, 3:36 PM

## 2016-07-18 NOTE — Discharge Summary (Signed)
Physician Discharge Summary  [name] ZDG:[id number] DOB: February 02, 1990 DOA: 07/15/2016  PCP: Vicenta Aly, FNP  Admit date: 07/15/2016 Discharge date: 07/18/2016  Admitted From: Home Disposition: Home   Recommendations for Outpatient Follow-up:  1. Follow up with PCP and endocrinology, Dr. Hartford Poli, in the next week, providing glucose record.  2. Consider psychiatry referral for flat affect. Pt's mother endorses depressive symptoms.  3. Patient at very high risk for readmission (11 in past 6 months). Care management outpatient follow up declined by patient. 4. Follow up with [facility] for management of severe gastroparesis.   Home Health: None Equipment/Devices: None Discharge Condition: Stable CODE STATUS: Full Diet recommendation: Frequent small meals with limited carbohydrates  Brief/Interim Summary: [name] is a 26 year old female with a history of T1DM with severe gastroparesis and frequent readmissions, HTN and asthma who presented 6/27 with dyspnea.   On admission patient was severely acidotic with pH of 6.8 consistent with diabetic ketoacidosis. Also with hyperkalemia and acute kidney injury. She was started on IV fluid, sodium bicarbonate, insulin drip and admitted to stepdown unit for further evaluation. Once DKA resolved, she was transferred to the floor where she remained stable, able to tolerate small meals with erythromycin, and was discharged with plans for PCP follow up.   Discharge Diagnoses:  Principal Problem:   DKA (diabetic ketoacidoses) ([place]) Active Problems:   Uncontrolled type 1 diabetes mellitus ([place])   AKI (acute kidney injury) ([place])   Sinus tachycardia   Gastroparesis  Severe diabetic ketoacidosis without coma: Patient with history of type 1 diabetes. Unknown if it is triggered by UTI. - Resolved with improvement of CBGs on home insulin regimen. Will continue this at discharge.  - Due to age, HbA1c goal is for tighter control, though HbA1c  is only 7.7%.  - Has received education from diabetes coordinator. Will f/u with endo as outpatient.  Diabetic gastroparesis: Significant delayed gastric emptying on previous admission, 6/21: 2% emptied at 1 hr ( normal >= 10%) 16% emptied at 2 hr ( normal >= 40%) 20% emptied at 3 hr ( normal >= 70%) 24% emptied at 4 hr ( normal >= 90%).  - Continue premeal erythromycin per GI recommendations at last admission with overall improvement.  - Follow up with [facility].   Hyperkalemia and hypomagnesemia in the setting of DKA: Resolved.   Acute kidney injury: Resolved.  UTI: Based on UA, leukocytosis, though culture grew multiple species. Possible trigger for DKA. Pt denies symptoms. Received ceftriaxone x3.   Shortness of breath likely in the setting of severe acidosis. CT chest was negative for PE. - Resolved.   Sinus tachycardia: Has demonstrated this on multiple admissions even after resolution of DKA/dehydration.  - Continue metoprolol at low dose, consider titrating upward as outpatient - Consider cardiology evaluation    Hyponatremia: Resolved   Discharge Instructions Discharge Instructions    Discharge instructions    Complete by:  As directed    You were admitted for shortness of breath that was associated with DKA. This has improved and you are stable for discharge with the following recommendations:  - Take all medications as prescribed.  - You can take meloxicam daily as needed for pain for a short course (sent to your pharmacy) - If your symptoms worsen, seek medical attention. Otherwise, call on Monday to schedule follow up with your PCP and with Dr. Hartford Poli. Take a recording of your glucose readings between now and then to help him decide what to do with insulin.  -  For gastroparesis, continue eating small meals frequently and taking erythromycin. Follow up with [facility] soon.     Allergies as of 07/18/2016      Reactions   Peanut-containing Drug Products Swelling, Other  (See Comments)   Reaction:  Swelling of mouth and lips    Food Swelling, Other (See Comments)   Pt is allergic to strawberries.   Reaction:  Swelling of mouth and lips    Ultram [tramadol] Itching      Medication List    TAKE these medications   albuterol 108 (90 Base) MCG/ACT inhaler Commonly known as:  PROVENTIL HFA;VENTOLIN HFA Inhale 2 puffs into the lungs every 6 (six) hours as needed for wheezing or shortness of breath.   albuterol (2.5 MG/3ML) 0.083% nebulizer solution Commonly known as:  PROVENTIL Take 2.5 mg by nebulization every 6 (six) hours as needed for wheezing or shortness of breath.   budesonide-formoterol 160-4.5 MCG/ACT inhaler Commonly known as:  SYMBICORT Inhale 2 puffs into the lungs 2 (two) times daily.   dicyclomine 20 MG tablet Commonly known as:  BENTYL Take 1 tablet (20 mg total) by mouth 2 (two) times daily.   erythromycin 250 MG tablet Commonly known as:  E-MYCIN Take 1 tablet (250 mg total) by mouth 3 (three) times daily with meals.   glucagon 1 MG injection Commonly known as:  GLUCAGON EMERGENCY Inject 1 mg into the vein once as needed.   insulin glargine 100 UNIT/ML injection Commonly known as:  LANTUS Inject 0.22 mLs (22 Units total) into the skin daily.   meloxicam 7.5 MG tablet Commonly known as:  MOBIC Take 1 tablet (7.5 mg total) by mouth daily.   metoprolol tartrate 25 MG tablet Commonly known as:  LOPRESSOR Take 0.5 tablets (12.5 mg total) by mouth 2 (two) times daily.   NOVOLOG FLEXPEN 100 UNIT/ML FlexPen Generic drug:  insulin aspart Inject into the skin See admin instructions. Pt uses 1 unit for every 10 grams of carbs with meals and snacks for BS greater than 50.   ondansetron 4 MG tablet Commonly known as:  ZOFRAN Take 1 tablet (4 mg total) by mouth every 6 (six) hours.   pantoprazole 40 MG tablet Commonly known as:  PROTONIX Take 1 tablet (40 mg total) by mouth 2 (two) times daily.   promethazine 25 MG  tablet Commonly known as:  PHENERGAN Take 1 tablet (25 mg total) by mouth every 6 (six) hours as needed for nausea or vomiting.   Vitamin D (Ergocalciferol) 50000 units Caps capsule Commonly known as:  DRISDOL Take 50,000 Units by mouth once a week.      Follow-up Information    Vicenta Aly, [place]. Schedule an appointment as soon as possible for a visit in 1 week(s).   Specialty:  Nurse Practitioner Contact information: [address] [place] [address] [phone]        Gerome Apley, MD. Schedule an appointment as soon as possible for a visit.   Specialty:  Endocrinology Contact information: [address] [place] [address] [phone]          Allergies  Allergen Reactions  . Peanut-Containing Drug Products Swelling and Other (See Comments)    Reaction:  Swelling of mouth and lips   . 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   Reaction:  Swelling of mouth and lips   . Ultram [Tramadol] Itching    Consultations:  Diabetes coordinator  Care management  Procedures/Studies: Dg Chest 2 View  Result Date: 07/15/2016 CLINICAL DATA:  New onset midsternal chest pain and dyspnea. Tachycardia this morning. EXAM: CHEST  2 VIEW COMPARISON:  06/03/2016 FINDINGS: The lungs are clear. The pulmonary vasculature is normal. Heart size is normal. Hilar and mediastinal contours are unremarkable. There is no pleural effusion. IMPRESSION: No active cardiopulmonary disease. Electronically Signed   By: Andreas Newport M.D.   On: 07/15/2016 06:12   Ct Angio Chest Pe W/cm &/or Wo Cm  Result Date: 07/15/2016 CLINICAL DATA:  Shortness of breath. EXAM: CT ANGIOGRAPHY CHEST WITH CONTRAST TECHNIQUE: Multidetector CT imaging of the chest was performed using the standard protocol during bolus administration of intravenous contrast. Multiplanar CT image reconstructions and MIPs were obtained to evaluate the  vascular anatomy. CONTRAST:  60 mL of Isovue 370 COMPARISON:  Chest x-ray from earlier today FINDINGS: Cardiovascular: Evaluation is severely limited due to severe respiratory motion. Limited views of the thoracic aorta demonstrate no aneurysm or dissection. The heart size is normal. No coronary artery calcifications are identified. Evaluation of the pulmonary arteries is markedly limited due to respiratory motion. The study is nondiagnostic in the evaluation for pulmonary emboli. Significant stairstep artifact is identified. Ill defined low-attenuation in the left main pulmonary artery may be artifactual. Pulmonary emboli cannot be confidently diagnosis or excluded on this study. Mediastinum/Nodes: The esophagus is fluid-filled along much of its length and it is thick walled over its distal third. No mediastinal air is noted. No pleural or pericardial effusions. No adenopathy. Lungs/Pleura: Evaluation is markedly limited due to severe respiratory motion. Central airways are normal. No pneumothorax. No evidence of pneumonia or aspiration. A small nodule in the posterior left upper lobe on series 11, image 19 is not excluded measuring 4 mm. This is of doubtful significance in a patient this age. No other nodules. No masses. Upper Abdomen: The stomach appears to be mildly distended within visualize limits. Hepatic steatosis. No other abnormalities are seen in the upper abdomen. Fullness of the right renal collecting system is not excluded on limited images. Musculoskeletal: No chest wall abnormality. No acute or significant osseous findings. Review of the MIP images confirms the above findings. IMPRESSION: 1. The study is nondiagnostic in the evaluation for pulmonary emboli due to severe respiratory artifact. Pulmonary emboli cannot be confidently diagnosed or excluded. Ill defined low-attenuation in the left main pulmonary artery is nonspecific but could be artifactual. If there is continued concern for pulmonary  emboli, a repeat study when the patient is breathing less rapidly or a V/Q scan may help with further evaluation. 2. The esophagus is fluid-filled and thick walled suggesting esophagitis. This could potentially be the cause of the patient's symptoms. Direct visualization could better evaluate. 3. Possible tiny nodule in the left upper lobe of doubtful significance given patient age. Electronically Signed   By: Dorise Bullion III M.D   On: 07/15/2016 09:57   Nm Gastric Emptying  Result Date: 07/09/2016 CLINICAL DATA:  Nausea and vomiting, abdominal pain for 2 years, diabetes mellitus EXAM: NUCLEAR MEDICINE GASTRIC EMPTYING SCAN TECHNIQUE: After oral ingestion of radiolabeled meal, sequential abdominal images were obtained for 4 hours. Percentage of activity emptying the stomach was calculated at 1 hour, 2 hour, 3 hour, and 4 hours. RADIOPHARMACEUTICALS:  2.2 mCi Tc-107m sulfur colloid in standardized meal COMPARISON:  None FINDINGS: Expected location of the stomach in the left upper quadrant. Ingested meal empties the stomach poorly over the course of the study. 2% emptied at  1 hr ( normal >= 10%) 16% emptied at 2 hr ( normal >= 40%) 20% emptied at 3 hr ( normal >= 70%) 24% emptied at 4 hr ( normal >= 90%) IMPRESSION: Significantly delayed gastric emptying study. Electronically Signed   By: Lavonia Dana M.D.   On: 07/09/2016 14:23   Dg Abd 2 Views  Result Date: 06/24/2016 CLINICAL DATA:  26 year old presenting with acute onset of abdominal pain and nausea and vomiting. Current history of poorly controlled diabetes with prior episodes of diabetic ketoacidosis. EXAM: ABDOMEN - 2 VIEW COMPARISON:  Abdominal x-rays 06/02/2016, 05/31/2016 and earlier. CT abdomen pelvis 02/20/2016 and earlier. Chest x-rays 05/31/2016, 01/17/2016 and earlier. FINDINGS: Bowel gas pattern unremarkable without evidence of obstruction or significant ileus. No evidence of free air or significant air-fluid levels on the erect image.  Expected stool burden in the colon. Surgical clips in the right upper quadrant from prior cholecystectomy. Phleboliths low on the left side of the pelvis as noted previously. No opaque urinary tract calculi. Regional skeleton intact. IMPRESSION: No acute abdominal abnormality. Electronically Signed   By: Evangeline Dakin M.D.   On: 06/24/2016 15:36   Subjective: Reports feeling better today. Able to tolerate fluids and solids in small volumes frequently. Denies emesis. Reports normal urine output.  Discharge Exam: Vitals:   07/18/16 1016 07/18/16 1351  BP: 110/83 (!) 131/103  Pulse: (!) 116 (!) 116  Resp:  16  Temp:  98.6 F (37 C)   General: 25yo F with flat affect in no distress HEENT: MMM Cardiovascular: Regular tachycardia, no murmur or JVD or edema Respiratory: Nonlabored, clear Abdominal: Soft, no tenderness, ND, bowel sounds +  Labs: Basic Metabolic Panel:  Recent Labs Lab 07/15/16 2229 07/16/16 0300 07/16/16 0746 07/16/16 0752 07/17/16 0330 07/18/16 0348  NA 133* 135  --  136 136 137  K 3.4* 3.0*  --  3.1* 4.4 3.9  CL 109 108  --  106 109 107  CO2 15* 18*  --  19* 16* 23  GLUCOSE 162* 144*  --  140* 253* 170*  BUN 20 19  --  16 8 7   CREATININE 0.56 0.57  --  0.60 0.57 0.58  CALCIUM 8.6* 8.5*  --  8.9 8.4* 9.0  MG  --   --  1.2*  --  1.5* 1.7  PHOS  --   --   --   --   --  3.3   Liver Function Tests:  Recent Labs Lab 07/13/16 1344 07/18/16 0348  AST 20 11*  ALT 12* 9*  ALKPHOS 113 84  BILITOT 0.7 0.4  PROT 8.5* 5.9*  ALBUMIN 4.8 3.1*   CBC:  Recent Labs Lab 07/13/16 1344 07/13/16 2121 07/15/16 0715 07/15/16 0741 07/15/16 1750 07/17/16 0330  WBC 9.2  --  20.5*  --  13.1* 6.6  NEUTROABS  --   --  15.9*  --   --   --   HGB 14.1 12.6 13.2 15.3* 15.6* 10.9*  HCT 41.5 37.0 41.4 45.0 45.2 31.9*  MCV 87.2  --  91.8  --  86.6 83.7  PLT 538*  --  640*  --  327 408*   CBG:  Recent Labs Lab 07/17/16 1224 07/17/16 1638 07/17/16 2109  07/18/16 0756 07/18/16 1241  GLUCAP 156* 91 211* 256* 111*   Hgb A1c  Recent Labs  07/16/16 0300  HGBA1C 7.7*   Urinalysis    Component Value Date/Time   COLORURINE YELLOW 07/16/2016 0600   APPEARANCEUR TURBID (  A) 07/16/2016 0600   LABSPEC 1.011 07/16/2016 0600   PHURINE 6.0 07/16/2016 0600   GLUCOSEU NEGATIVE 07/16/2016 0600   GLUCOSEU >=1000 11/07/2012 1205   HGBUR SMALL (A) 07/16/2016 0600   BILIRUBINUR NEGATIVE 07/16/2016 0600   KETONESUR 80 (A) 07/16/2016 0600   PROTEINUR 30 (A) 07/16/2016 0600   UROBILINOGEN 0.2 11/24/2014 1045   NITRITE NEGATIVE 07/16/2016 0600   LEUKOCYTESUR LARGE (A) 07/16/2016 0600    Microbiology Recent Results (from the past 240 hour(s))  MRSA PCR Screening     Status: None   Collection Time: 07/15/16  5:53 PM  Result Value Ref Range Status   MRSA by PCR NEGATIVE NEGATIVE Final    Comment:        The GeneXpert MRSA Assay (FDA approved for NASAL specimens only), is one component of a comprehensive MRSA colonization surveillance program. It is not intended to diagnose MRSA infection nor to guide or monitor treatment for MRSA infections.   Culture, Urine     Status: Abnormal   Collection Time: 07/16/16  1:00 PM  Result Value Ref Range Status   Specimen Description URINE, RANDOM  Final   Special Requests NONE  Final   Culture MULTIPLE SPECIES PRESENT, SUGGEST RECOLLECTION (A)  Final   Report Status 07/17/2016 FINAL  Final    Time coordinating discharge: Approximately 40 minutes  Vance Gather, MD  [facility] 07/18/2016, 3:56 PM Pager [phone]

## 2016-07-18 NOTE — Progress Notes (Signed)
Discharge instructions reviewed with patient and patient verbalized understanding.Patient has declined oral antibiotic and CBG check stating she will resume when she gets home.  Patient discharged under her own care declined wheelchair to main lobby.

## 2016-07-18 NOTE — Progress Notes (Signed)
Patient is refusing all oral meds.She states she dose not feel like taking them. I re approached her after I had given her pain meds and meds for nausea she still refused. I explained how they would make her feel better and get well quicker. She continue to refuse.

## 2016-07-23 ENCOUNTER — Emergency Department (HOSPITAL_COMMUNITY)
Admission: EM | Admit: 2016-07-23 | Discharge: 2016-07-23 | Disposition: A | Discharge status: Home / Self Care | Payer: Medicaid Other | Attending: Emergency Medicine | Admitting: Emergency Medicine

## 2016-07-23 ENCOUNTER — Emergency Department (HOSPITAL_COMMUNITY): Payer: Medicaid Other

## 2016-07-23 DIAGNOSIS — E876 Hypokalemia: Secondary | ICD-10-CM | POA: Insufficient documentation

## 2016-07-23 DIAGNOSIS — I110 Hypertensive heart disease with heart failure: Secondary | ICD-10-CM | POA: Diagnosis not present

## 2016-07-23 DIAGNOSIS — E860 Dehydration: Secondary | ICD-10-CM | POA: Insufficient documentation

## 2016-07-23 DIAGNOSIS — Z91010 Allergy to peanuts: Secondary | ICD-10-CM | POA: Insufficient documentation

## 2016-07-23 DIAGNOSIS — E119 Type 2 diabetes mellitus without complications: Secondary | ICD-10-CM | POA: Insufficient documentation

## 2016-07-23 DIAGNOSIS — M791 Myalgia: Secondary | ICD-10-CM | POA: Diagnosis present

## 2016-07-23 DIAGNOSIS — Z794 Long term (current) use of insulin: Secondary | ICD-10-CM | POA: Diagnosis not present

## 2016-07-23 DIAGNOSIS — Z79899 Other long term (current) drug therapy: Secondary | ICD-10-CM | POA: Insufficient documentation

## 2016-07-23 DIAGNOSIS — J45909 Unspecified asthma, uncomplicated: Secondary | ICD-10-CM | POA: Diagnosis not present

## 2016-07-23 DIAGNOSIS — R112 Nausea with vomiting, unspecified: Secondary | ICD-10-CM | POA: Diagnosis not present

## 2016-07-23 DIAGNOSIS — I5032 Chronic diastolic (congestive) heart failure: Secondary | ICD-10-CM | POA: Insufficient documentation

## 2016-07-23 LAB — HEPATIC FUNCTION PANEL
ALT: 12 U/L — AB (ref 14–54)
AST: 23 U/L (ref 15–41)
Albumin: 4.6 g/dL (ref 3.5–5.0)
Alkaline Phosphatase: 97 U/L (ref 38–126)
BILIRUBIN DIRECT: 0.2 mg/dL (ref 0.1–0.5)
Indirect Bilirubin: 0.4 mg/dL (ref 0.3–0.9)
Total Bilirubin: 0.6 mg/dL (ref 0.3–1.2)
Total Protein: 8.3 g/dL — ABNORMAL HIGH (ref 6.5–8.1)

## 2016-07-23 LAB — BLOOD GAS, VENOUS
ACID-BASE EXCESS: 5.2 mmol/L — AB (ref 0.0–2.0)
Bicarbonate: 29.2 mmol/L — ABNORMAL HIGH (ref 20.0–28.0)
O2 SAT: 31.4 %
PCO2 VEN: 41.9 mmHg — AB (ref 44.0–60.0)
Patient temperature: 98.6
pH, Ven: 7.458 — ABNORMAL HIGH (ref 7.250–7.430)

## 2016-07-23 LAB — CBC
HCT: 45.5 % (ref 36.0–46.0)
HEMOGLOBIN: 15.7 g/dL — AB (ref 12.0–15.0)
MCH: 29.4 pg (ref 26.0–34.0)
MCHC: 34.5 g/dL (ref 30.0–36.0)
MCV: 85.2 fL (ref 78.0–100.0)
Platelets: 586 10*3/uL — ABNORMAL HIGH (ref 150–400)
RBC: 5.34 MIL/uL — AB (ref 3.87–5.11)
RDW: 13.6 % (ref 11.5–15.5)
WBC: 8.3 10*3/uL (ref 4.0–10.5)

## 2016-07-23 LAB — I-STAT CHEM 8, ED
BUN: 22 mg/dL — AB (ref 6–20)
CHLORIDE: 90 mmol/L — AB (ref 101–111)
CREATININE: 0.6 mg/dL (ref 0.44–1.00)
Calcium, Ion: 1.09 mmol/L — ABNORMAL LOW (ref 1.15–1.40)
Glucose, Bld: 261 mg/dL — ABNORMAL HIGH (ref 65–99)
HEMATOCRIT: 51 % — AB (ref 36.0–46.0)
Hemoglobin: 17.3 g/dL — ABNORMAL HIGH (ref 12.0–15.0)
Potassium: 3 mmol/L — ABNORMAL LOW (ref 3.5–5.1)
Sodium: 133 mmol/L — ABNORMAL LOW (ref 135–145)
TCO2: 29 mmol/L (ref 0–100)

## 2016-07-23 LAB — I-STAT BETA HCG BLOOD, ED (MC, WL, AP ONLY): I-stat hCG, quantitative: <5 m[IU]/mL (ref <5)

## 2016-07-23 LAB — I-STAT TROPONIN, ED: Troponin i, poc: 0.01 ng/mL (ref 0.00–0.08)

## 2016-07-23 LAB — LIPASE, BLOOD: Lipase: <10 U/L — ABNORMAL LOW (ref 11–51)

## 2016-07-23 LAB — MAGNESIUM: Magnesium: 1.7 mg/dL (ref 1.7–2.4)

## 2016-07-23 MED ORDER — METOCLOPRAMIDE HCL 10 MG PO TABS: 10.0000 mg | ORAL_TABLET | Freq: Four times a day (QID) | ORAL | 0 refills | Status: DC | PRN

## 2016-07-23 MED ORDER — METOCLOPRAMIDE HCL 5 MG/ML IJ SOLN
5.0000 mg | Freq: Once | INTRAMUSCULAR | Status: AC
  Administered 2016-07-23: 5 mg via INTRAVENOUS
  Filled 2016-07-23: qty 2

## 2016-07-23 MED ORDER — SODIUM CHLORIDE 0.9 % IV BOLUS (SEPSIS)
1000.0000 mL | Freq: Once | INTRAVENOUS | Status: AC
  Administered 2016-07-23: 1000 mL via INTRAVENOUS

## 2016-07-23 MED ORDER — SODIUM CHLORIDE 0.9 % IV BOLUS (SEPSIS)
2000.0000 mL | Freq: Once | INTRAVENOUS | Status: AC
  Administered 2016-07-23: 2000 mL via INTRAVENOUS

## 2016-07-23 MED ORDER — POTASSIUM CHLORIDE CRYS ER 20 MEQ PO TBCR
40.0000 meq | EXTENDED_RELEASE_TABLET | Freq: Once | ORAL | Status: AC
  Administered 2016-07-23: 40 meq via ORAL
  Filled 2016-07-23: qty 2

## 2016-07-23 MED ORDER — ACETAMINOPHEN 325 MG PO TABS: 650.0000 mg | ORAL_TABLET | Freq: Once | ORAL | Status: DC

## 2016-07-23 NOTE — Discharge Instructions (Signed)
Make sure that you drink at least six 8 ounce glasses of water each day in order to stay well-hydrated. Take the medication prescribed as needed for nausea. See your primary care physician if you or return if you feel worse for concern for any reason

## 2016-07-23 NOTE — ED Triage Notes (Signed)
Pt c/o generalized body pains, acid reflux symptoms, abdominal pain, nausea, emesis onset Saturday.

## 2016-07-23 NOTE — ED Notes (Signed)
Patient is alert and oriented x3.  She was given DC instructions and follow up visit instructions.  Patient gave verbal understanding. She was DC ambulatory under her own power to home.  V/S stable.  He was not showing any signs of distress on DC 

## 2016-07-23 NOTE — ED Provider Notes (Signed)
[facility] DEPT Provider Note   CSN: [id number] Arrival date & time: 07/23/16  9417     History   Chief Complaint Chief Complaint  Patient presents with  . Generalized Body Aches  . Emesis    HPI [name] is a 26 y.o. female.Patient reports that she feels like she is dehydrated. She's been vomiting and had diffuse body aches onset 4 days ago. She vomited 3 times yesterday. No vomiting today. No fever. No cough. Last bowel movement 4 days ago. She complains of burning in her throat, "like acid reflux." Treated herself with Zofran, without relief. Nothing makes symptoms better or worse. No other associated symptoms  HPI  Past Medical History:  Diagnosis Date  . Anxiety   . Arthritis   . Asthma   . Diabetes mellitus 2007   IDDM.  poorly controlled, multiple admits with DKA  . Gallstones   . Gastroparesis   . Heart murmur   . Hepatic steatosis 11/26/2014   and hepatomegaly  . Hypertension    NOT CURRENTLY ON ANY BP MED  . Liver mass 11/26/2014  . Pancreatitis, acute 11/26/2014    Patient Active Problem List   Diagnosis Date Noted  . Thrombocytosis ([place]) 07/08/2016  . Candiduria, asymptomatic 06/23/2016  . Hyponatremia 06/13/2016  . Hematuria 05/13/2016  . UTI (urinary tract infection) 01/22/2016  . Diarrhea 11/09/2015  . Acute urinary retention   . GERD (gastroesophageal reflux disease) 07/10/2015  . Depression with anxiety 07/10/2015  . Gastroparesis 06/22/2015  . Altered mental status 06/22/2015  . Volume depletion 06/10/2015  . Protein-calorie malnutrition, severe 06/10/2015  . Hyperglycemia   . Elevated bilirubin   . Hematemesis with nausea   . Intractable nausea and vomiting 05/28/2015  . Abdominal pain in female   . Abdominal pain 05/24/2015  . Hypertension 05/24/2015  . Dehydration   . Chronic diastolic heart failure ([place]) 04/11/2015  . Hematemesis 04/08/2015  . DKA (diabetic ketoacidoses) ([place]) 03/22/2015  . S/P laparoscopic  cholecystectomy 02/11/2015  . Postextubation stridor   . Pancreatitis, acute 11/26/2014  . Volume overload 11/26/2014  . Hypokalemia 11/26/2014  . Hepatic steatosis 11/26/2014  . Liver mass 11/26/2014  . Sepsis ([place]) 11/25/2014  . Sinus tachycardia 11/25/2014  . Hypomagnesemia 11/25/2014  . Hypophosphatemia 11/25/2014  . Elevated LFTs 11/24/2014  . AKI (acute kidney injury) ([place]) 11/24/2014  . Migraine headache 11/24/2014  . Asthma 06/29/2012  . Uncontrolled type 1 diabetes mellitus ([place]) 06/19/2010  . Goiter, unspecified 06/19/2010    Past Surgical History:  Procedure Laterality Date  . CHOLECYSTECTOMY N/A 02/11/2015   Procedure: LAPAROSCOPIC CHOLECYSTECTOMY WITH INTRAOPERATIVE CHOLANGIOGRAM;  Surgeon: Greer Pickerel, MD;  Location: [facility]S;  Service: General;  Laterality: N/A;  . ESOPHAGOGASTRODUODENOSCOPY (EGD) WITH PROPOFOL Left 09/20/2014   Procedure: ESOPHAGOGASTRODUODENOSCOPY (EGD) WITH PROPOFOL;  Surgeon: Arta Silence, MD;  Location: [facility] ENDOSCOPY;  Service: Endoscopy;  Laterality: Left;  . WISDOM TOOTH EXTRACTION      OB History    Gravida Para Term Preterm AB Living   2 1 0 1 1 1    SAB TAB Ectopic Multiple Live Births   0 1 0 0 1       Home Medications    Prior to Admission medications   Medication Sig Start Date End Date Taking? Authorizing Provider  albuterol (PROVENTIL HFA;VENTOLIN HFA) 108 (90 BASE) MCG/ACT inhaler Inhale 2 puffs into the lungs every 6 (six) hours as needed for wheezing or shortness of breath.     [provider]  albuterol (  PROVENTIL) (2.5 MG/3ML) 0.083% nebulizer solution Take 2.5 mg by nebulization every 6 (six) hours as needed for wheezing or shortness of breath.     [provider]  budesonide-formoterol (SYMBICORT) 160-4.5 MCG/ACT inhaler Inhale 2 puffs into the lungs 2 (two) times daily.     [provider]  dicyclomine (BENTYL) 20 MG tablet Take 1 tablet (20 mg total) by mouth 2 (two) times daily. 07/13/16    Volanda Napoleon, PA-C  erythromycin (E-MYCIN) 250 MG tablet Take 1 tablet (250 mg total) by mouth 3 (three) times daily with meals. 07/11/16   Raiford Noble Latif, DO  glucagon (GLUCAGON EMERGENCY) 1 MG injection Inject 1 mg into the vein once as needed. 06/14/16   Sheikh, Georgina Quint Latif, DO  insulin aspart (NOVOLOG FLEXPEN) 100 UNIT/ML FlexPen Inject into the skin See admin instructions. Pt uses 1 unit for every 10 grams of carbs with meals and snacks for BS greater than 50.    [provider]  insulin glargine (LANTUS) 100 UNIT/ML injection Inject 0.22 mLs (22 Units total) into the skin daily. 06/28/16   Hosie Poisson, MD  meloxicam (MOBIC) 7.5 MG tablet Take 1 tablet (7.5 mg total) by mouth daily. 07/18/16   Patrecia Pour, MD  metoprolol tartrate (LOPRESSOR) 25 MG tablet Take 0.5 tablets (12.5 mg total) by mouth 2 (two) times daily. 05/10/16   Mariel Aloe, MD  ondansetron (ZOFRAN) 4 MG tablet Take 1 tablet (4 mg total) by mouth every 6 (six) hours. 07/13/16   Volanda Napoleon, PA-C  pantoprazole (PROTONIX) 40 MG tablet Take 1 tablet (40 mg total) by mouth 2 (two) times daily. 02/25/16   Regalado, Jerald Kief A, MD  promethazine (PHENERGAN) 25 MG tablet Take 1 tablet (25 mg total) by mouth every 6 (six) hours as needed for nausea or vomiting. 06/15/16   Deno Etienne, DO  Vitamin D, Ergocalciferol, (DRISDOL) 50000 units CAPS capsule Take 50,000 Units by mouth once a week. 05/26/16   [provider]    Family History Family History  Problem Relation Age of Onset  . Heart disease Maternal Grandmother   . Heart disease Maternal Grandfather   . Diabetes Mother   . Hyperlipidemia Mother   . Hypertension Father   . Heart disease Father   . Hypertension Paternal Grandmother   . Cancer Paternal Grandfather     Social History Social History  Substance Use Topics  . Smoking status: Never Smoker  . Smokeless tobacco: Never Used  . Alcohol use No     Allergies   Peanut-containing drug  products; Food; and Ultram [tramadol]   Review of Systems Review of Systems  Constitutional: Negative.        Feels dehydrated  HENT: Negative.   Respiratory: Negative.   Cardiovascular: Negative.   Gastrointestinal: Positive for nausea and vomiting.  Genitourinary:       Irregular menses  Musculoskeletal: Positive for myalgias.       Diffuse myalgias  Skin: Negative.   Allergic/Immunologic: Positive for immunocompromised state.       Diabetic  Neurological: Negative.   Psychiatric/Behavioral: Negative.   All other systems reviewed and are negative.    Physical Exam Updated Vital Signs BP 106/77   Pulse (!) 146   Temp 98.1 F (36.7 C) (Oral)   Resp 16   SpO2 100%   Physical Exam  Constitutional:  Moderately ill-appearing  HENT:  Head: Normocephalic and atraumatic.  Mucous membranes dry  Eyes: Conjunctivae are normal. Pupils are equal,  round, and reactive to light.  Neck: Neck supple. No tracheal deviation present. No thyromegaly present.  Cardiovascular: Regular rhythm.   No murmur heard. Tachycardic  Pulmonary/Chest: Effort normal and breath sounds normal.  Abdominal: Soft. Bowel sounds are normal. She exhibits no distension. There is no tenderness.  Musculoskeletal: Normal range of motion. She exhibits no edema or tenderness.  Neurological: She is alert. Coordination normal.  Skin: Skin is warm and dry. Capillary refill takes less than 2 seconds. No rash noted.  Psychiatric: She has a normal mood and affect.  Nursing note and vitals reviewed.    ED Treatments / Results  Labs (all labs ordered are listed, but only abnormal results are displayed) Labs Reviewed  LIPASE, BLOOD  CBC  URINALYSIS, ROUTINE W REFLEX MICROSCOPIC  HEPATIC FUNCTION PANEL  BLOOD GAS, VENOUS  CBG MONITORING, ED  I-STAT BETA HCG BLOOD, ED ([facility], [facility], [facility] ONLY)  I-STAT CHEM 8, ED  I-STAT TROPOININ, ED   X-rays viewed by me EKG  EKG Interpretation  Date/Time:  Thursday July 23 2016 08:58:23 EDT Ventricular Rate:  130 PR Interval:    QRS Duration: 78 QT Interval:  302 QTC Calculation: 445 R Axis:   85 Text Interpretation:  Sinus tachycardia Consider right atrial enlargement LVH by voltage Nonspecific T abnormalities, diffuse leads No significant change since last tracing Confirmed by Orlie Dakin [phone]) on 07/23/2016 10:24:41 AM      Results for orders placed or performed during the hospital encounter of 07/23/16  Lipase, blood  Result Value Ref Range   Lipase <10 (L) 11 - 51 U/L  CBC  Result Value Ref Range   WBC 8.3 4.0 - 10.5 K/uL   RBC 5.34 (H) 3.87 - 5.11 MIL/uL   Hemoglobin 15.7 (H) 12.0 - 15.0 g/dL   HCT 45.5 36.0 - 46.0 %   MCV 85.2 78.0 - 100.0 fL   MCH 29.4 26.0 - 34.0 pg   MCHC 34.5 30.0 - 36.0 g/dL   RDW 13.6 11.5 - 15.5 %   Platelets 586 (H) 150 - 400 K/uL  Hepatic function panel  Result Value Ref Range   Total Protein 8.3 (H) 6.5 - 8.1 g/dL   Albumin 4.6 3.5 - 5.0 g/dL   AST 23 15 - 41 U/L   ALT 12 (L) 14 - 54 U/L   Alkaline Phosphatase 97 38 - 126 U/L   Total Bilirubin 0.6 0.3 - 1.2 mg/dL   Bilirubin, Direct 0.2 0.1 - 0.5 mg/dL   Indirect Bilirubin 0.4 0.3 - 0.9 mg/dL  Blood gas, venous  Result Value Ref Range   pH, Ven 7.458 (H) 7.250 - 7.430   pCO2, Ven 41.9 (L) 44.0 - 60.0 mmHg   pO2, Ven BELOW REPORTABLE RANGE. 32.0 - 45.0 mmHg   Bicarbonate 29.2 (H) 20.0 - 28.0 mmol/L   Acid-Base Excess 5.2 (H) 0.0 - 2.0 mmol/L   O2 Saturation 31.4 %   Patient temperature 98.6    Collection site DRAWN BY RN    Drawn by DRAWN BY RN    Sample type VEIN   Magnesium  Result Value Ref Range   Magnesium 1.7 1.7 - 2.4 mg/dL  I-Stat beta hCG blood, ED  Result Value Ref Range   I-stat hCG, quantitative <5.0 <5 mIU/mL   Comment 3          I-stat chem 8, ed  Result Value Ref Range   Sodium 133 (L) 135 - 145 mmol/L   Potassium 3.0 (L) 3.5 -  5.1 mmol/L   Chloride 90 (L) 101 - 111 mmol/L   BUN 22 (H) 6 - 20 mg/dL   Creatinine, Ser 0.60  0.44 - 1.00 mg/dL   Glucose, Bld 261 (H) 65 - 99 mg/dL   Calcium, Ion 1.09 (L) 1.15 - 1.40 mmol/L   TCO2 29 0 - 100 mmol/L   Hemoglobin 17.3 (H) 12.0 - 15.0 g/dL   HCT 51.0 (H) 36.0 - 46.0 %  I-stat troponin, ED  Result Value Ref Range   Troponin i, poc 0.01 0.00 - 0.08 ng/mL   Comment 3           Dg Chest 2 View  Result Date: 07/15/2016 CLINICAL DATA:  New onset midsternal chest pain and dyspnea. Tachycardia this morning. EXAM: CHEST  2 VIEW COMPARISON:  06/03/2016 FINDINGS: The lungs are clear. The pulmonary vasculature is normal. Heart size is normal. Hilar and mediastinal contours are unremarkable. There is no pleural effusion. IMPRESSION: No active cardiopulmonary disease. Electronically Signed   By: Andreas Newport M.D.   On: 07/15/2016 06:12   Ct Angio Chest Pe W/cm &/or Wo Cm  Result Date: 07/15/2016 CLINICAL DATA:  Shortness of breath. EXAM: CT ANGIOGRAPHY CHEST WITH CONTRAST TECHNIQUE: Multidetector CT imaging of the chest was performed using the standard protocol during bolus administration of intravenous contrast. Multiplanar CT image reconstructions and MIPs were obtained to evaluate the vascular anatomy. CONTRAST:  60 mL of Isovue 370 COMPARISON:  Chest x-ray from earlier today FINDINGS: Cardiovascular: Evaluation is severely limited due to severe respiratory motion. Limited views of the thoracic aorta demonstrate no aneurysm or dissection. The heart size is normal. No coronary artery calcifications are identified. Evaluation of the pulmonary arteries is markedly limited due to respiratory motion. The study is nondiagnostic in the evaluation for pulmonary emboli. Significant stairstep artifact is identified. Ill defined low-attenuation in the left main pulmonary artery may be artifactual. Pulmonary emboli cannot be confidently diagnosis or excluded on this study. Mediastinum/Nodes: The esophagus is fluid-filled along much of its length and it is thick walled over its distal third. No  mediastinal air is noted. No pleural or pericardial effusions. No adenopathy. Lungs/Pleura: Evaluation is markedly limited due to severe respiratory motion. Central airways are normal. No pneumothorax. No evidence of pneumonia or aspiration. A small nodule in the posterior left upper lobe on series 11, image 19 is not excluded measuring 4 mm. This is of doubtful significance in a patient this age. No other nodules. No masses. Upper Abdomen: The stomach appears to be mildly distended within visualize limits. Hepatic steatosis. No other abnormalities are seen in the upper abdomen. Fullness of the right renal collecting system is not excluded on limited images. Musculoskeletal: No chest wall abnormality. No acute or significant osseous findings. Review of the MIP images confirms the above findings. IMPRESSION: 1. The study is nondiagnostic in the evaluation for pulmonary emboli due to severe respiratory artifact. Pulmonary emboli cannot be confidently diagnosed or excluded. Ill defined low-attenuation in the left main pulmonary artery is nonspecific but could be artifactual. If there is continued concern for pulmonary emboli, a repeat study when the patient is breathing less rapidly or a V/Q scan may help with further evaluation. 2. The esophagus is fluid-filled and thick walled suggesting esophagitis. This could potentially be the cause of the patient's symptoms. Direct visualization could better evaluate. 3. Possible tiny nodule in the left upper lobe of doubtful significance given patient age. Electronically Signed   By: Dorise Bullion III M.D  On: 07/15/2016 09:57   Nm Gastric Emptying  Result Date: 07/09/2016 CLINICAL DATA:  Nausea and vomiting, abdominal pain for 2 years, diabetes mellitus EXAM: NUCLEAR MEDICINE GASTRIC EMPTYING SCAN TECHNIQUE: After oral ingestion of radiolabeled meal, sequential abdominal images were obtained for 4 hours. Percentage of activity emptying the stomach was calculated at 1  hour, 2 hour, 3 hour, and 4 hours. RADIOPHARMACEUTICALS:  2.2 mCi Tc-40m sulfur colloid in standardized meal COMPARISON:  None FINDINGS: Expected location of the stomach in the left upper quadrant. Ingested meal empties the stomach poorly over the course of the study. 2% emptied at 1 hr ( normal >= 10%) 16% emptied at 2 hr ( normal >= 40%) 20% emptied at 3 hr ( normal >= 70%) 24% emptied at 4 hr ( normal >= 90%) IMPRESSION: Significantly delayed gastric emptying study. Electronically Signed   By: Lavonia Dana M.D.   On: 07/09/2016 14:23   Dg Abd 2 Views  Result Date: 06/24/2016 CLINICAL DATA:  26 year old presenting with acute onset of abdominal pain and nausea and vomiting. Current history of poorly controlled diabetes with prior episodes of diabetic ketoacidosis. EXAM: ABDOMEN - 2 VIEW COMPARISON:  Abdominal x-rays 06/02/2016, 05/31/2016 and earlier. CT abdomen pelvis 02/20/2016 and earlier. Chest x-rays 05/31/2016, 01/17/2016 and earlier. FINDINGS: Bowel gas pattern unremarkable without evidence of obstruction or significant ileus. No evidence of free air or significant air-fluid levels on the erect image. Expected stool burden in the colon. Surgical clips in the right upper quadrant from prior cholecystectomy. Phleboliths low on the left side of the pelvis as noted previously. No opaque urinary tract calculi. Regional skeleton intact. IMPRESSION: No acute abdominal abnormality. Electronically Signed   By: Evangeline Dakin M.D.   On: 06/24/2016 15:36   Dg Abd Acute W/chest  Result Date: 07/23/2016 CLINICAL DATA:  Generalized body aches, gastroesophageal reflux symptoms, abdominal pain associated with nausea and vomiting began on Saturday. History of fatty liver disease, pancreatitis, gastro paresis, gallstones, diabetes, asthma. EXAM: DG ABDOMEN ACUTE W/ 1V CHEST COMPARISON:  CT scan of the chest of July 15, 2016 and chest x-ray of the same day FINDINGS: The lungs are adequately inflated and clear. The heart  and mediastinal structures are normal. There is no pleural effusion no hiatal hernia is demonstrated. The observed bony thorax is unremarkable. Within the abdomen the bowel gas pattern is normal. There are no abnormal soft tissue calcifications. There are surgical clips in the gallbladder fossa. The bony structures exhibit no acute abnormality. IMPRESSION: No acute intra-abdominal abnormality is observed. There is no acute cardiopulmonary abnormality either. Electronically Signed   By: David  Martinique M.D.   On: 07/23/2016 10:08   Radiology No results found.  Procedures Procedures (including critical care time)  Medications Ordered in ED Medications  metoCLOPramide (REGLAN) injection 5 mg (not administered)  sodium chloride 0.9 % bolus 2,000 mL (not administered)     Initial Impression / Assessment and Plan / ED Course  I have reviewed the triage vital signs and the nursing notes.  Pertinent labs & imaging results that were available during my care of the patient were reviewed by me and considered in my medical decision making (see chart for details).    10:35 AM patient states "I feel much better. I want to go home." After treatment with intravenous Reglan and intravenous normal saline  11:40 AM patient is alert awake asymptomatic, not lightheaded on standing. Feels ready to go home. Received potassium supplementation here. Plan encourage oral hydration or prescription Reglan.  Follow-up with PMD or return as needed Final Clinical Impressions(s) / ED Diagnoses  Diagnosis #1 dehydration #2 nausea and vomiting #3 hypokalemia Final diagnoses:  None    New Prescriptions New Prescriptions   No medications on file     Orlie Dakin, MD 07/23/16 1147

## 2016-07-24 DIAGNOSIS — E559 Vitamin D deficiency, unspecified: Secondary | ICD-10-CM

## 2016-07-24 HISTORY — DX: Vitamin D deficiency, unspecified: E55.9

## 2016-09-26 ENCOUNTER — Encounter (HOSPITAL_COMMUNITY): Payer: Self-pay

## 2016-09-26 ENCOUNTER — Inpatient Hospital Stay (HOSPITAL_COMMUNITY)
Admission: EM | Admit: 2016-09-26 | Discharge: 2016-09-29 | DRG: 637 | Disposition: A | Discharge status: Home / Self Care | Payer: Medicaid Other | Attending: Internal Medicine | Admitting: Internal Medicine

## 2016-09-26 DIAGNOSIS — Z833 Family history of diabetes mellitus: Secondary | ICD-10-CM | POA: Diagnosis not present

## 2016-09-26 DIAGNOSIS — Z79899 Other long term (current) drug therapy: Secondary | ICD-10-CM | POA: Diagnosis not present

## 2016-09-26 DIAGNOSIS — Z91010 Allergy to peanuts: Secondary | ICD-10-CM | POA: Diagnosis not present

## 2016-09-26 DIAGNOSIS — E1010 Type 1 diabetes mellitus with ketoacidosis without coma: Secondary | ICD-10-CM | POA: Diagnosis present

## 2016-09-26 DIAGNOSIS — K219 Gastro-esophageal reflux disease without esophagitis: Secondary | ICD-10-CM | POA: Diagnosis present

## 2016-09-26 DIAGNOSIS — K760 Fatty (change of) liver, not elsewhere classified: Secondary | ICD-10-CM | POA: Diagnosis present

## 2016-09-26 DIAGNOSIS — E43 Unspecified severe protein-calorie malnutrition: Secondary | ICD-10-CM | POA: Diagnosis present

## 2016-09-26 DIAGNOSIS — Z794 Long term (current) use of insulin: Secondary | ICD-10-CM

## 2016-09-26 DIAGNOSIS — E1043 Type 1 diabetes mellitus with diabetic autonomic (poly)neuropathy: Secondary | ICD-10-CM | POA: Diagnosis present

## 2016-09-26 DIAGNOSIS — D899 Disorder involving the immune mechanism, unspecified: Secondary | ICD-10-CM | POA: Diagnosis present

## 2016-09-26 DIAGNOSIS — Z8249 Family history of ischemic heart disease and other diseases of the circulatory system: Secondary | ICD-10-CM

## 2016-09-26 DIAGNOSIS — Z6820 Body mass index (BMI) 20.0-20.9, adult: Secondary | ICD-10-CM

## 2016-09-26 DIAGNOSIS — R160 Hepatomegaly, not elsewhere classified: Secondary | ICD-10-CM | POA: Diagnosis present

## 2016-09-26 DIAGNOSIS — F418 Other specified anxiety disorders: Secondary | ICD-10-CM | POA: Diagnosis present

## 2016-09-26 DIAGNOSIS — K3184 Gastroparesis: Secondary | ICD-10-CM | POA: Diagnosis present

## 2016-09-26 DIAGNOSIS — Z885 Allergy status to narcotic agent status: Secondary | ICD-10-CM

## 2016-09-26 DIAGNOSIS — F419 Anxiety disorder, unspecified: Secondary | ICD-10-CM | POA: Diagnosis present

## 2016-09-26 DIAGNOSIS — F1290 Cannabis use, unspecified, uncomplicated: Secondary | ICD-10-CM | POA: Diagnosis present

## 2016-09-26 DIAGNOSIS — Z9049 Acquired absence of other specified parts of digestive tract: Secondary | ICD-10-CM

## 2016-09-26 DIAGNOSIS — IMO0002 Reserved for concepts with insufficient information to code with codable children: Secondary | ICD-10-CM | POA: Diagnosis present

## 2016-09-26 DIAGNOSIS — E1065 Type 1 diabetes mellitus with hyperglycemia: Secondary | ICD-10-CM | POA: Diagnosis present

## 2016-09-26 LAB — BASIC METABOLIC PANEL
Anion gap: 14 (ref 5–15)
BUN: 12 mg/dL (ref 6–20)
CALCIUM: 8.9 mg/dL (ref 8.9–10.3)
CHLORIDE: 112 mmol/L — AB (ref 101–111)
CO2: 14 mmol/L — AB (ref 22–32)
Creatinine, Ser: 0.63 mg/dL (ref 0.44–1.00)
GFR calc Af Amer: >60 mL/min (ref >60)
GFR calc non Af Amer: >60 mL/min (ref >60)
GLUCOSE: 277 mg/dL — AB (ref 65–99)
Potassium: 4.7 mmol/L (ref 3.5–5.1)
Sodium: 140 mmol/L (ref 135–145)

## 2016-09-26 LAB — BLOOD GAS, VENOUS
Acid-base deficit: 11.4 mmol/L — ABNORMAL HIGH (ref 0.0–2.0)
Bicarbonate: 14.7 mmol/L — ABNORMAL LOW (ref 20.0–28.0)
O2 Saturation: 65.6 %
PCO2 VEN: 35.2 mmHg — AB (ref 44.0–60.0)
PH VEN: 7.245 — AB (ref 7.250–7.430)
PO2 VEN: 42 mmHg (ref 32.0–45.0)
Patient temperature: 98.6

## 2016-09-26 LAB — GLUCOSE, CAPILLARY
GLUCOSE-CAPILLARY: 148 mg/dL — AB (ref 65–99)
GLUCOSE-CAPILLARY: 127 mg/dL — AB (ref 65–99)
Glucose-Capillary: 254 mg/dL — ABNORMAL HIGH (ref 65–99)
Glucose-Capillary: 230 mg/dL — ABNORMAL HIGH (ref 65–99)
Glucose-Capillary: 207 mg/dL — ABNORMAL HIGH (ref 65–99)
Glucose-Capillary: 185 mg/dL — ABNORMAL HIGH (ref 65–99)

## 2016-09-26 LAB — CBC
HCT: 38.7 % (ref 36.0–46.0)
HEMOGLOBIN: 12.9 g/dL (ref 12.0–15.0)
MCH: 28.8 pg (ref 26.0–34.0)
MCHC: 33.3 g/dL (ref 30.0–36.0)
MCV: 86.4 fL (ref 78.0–100.0)
Platelets: 488 10*3/uL — ABNORMAL HIGH (ref 150–400)
RBC: 4.48 MIL/uL (ref 3.87–5.11)
RDW: 14.2 % (ref 11.5–15.5)
WBC: 12.5 10*3/uL — AB (ref 4.0–10.5)

## 2016-09-26 LAB — COMPREHENSIVE METABOLIC PANEL
ALBUMIN: 4.6 g/dL (ref 3.5–5.0)
ALK PHOS: 121 U/L (ref 38–126)
ALT: 17 U/L (ref 14–54)
ANION GAP: 18 — AB (ref 5–15)
AST: 47 U/L — ABNORMAL HIGH (ref 15–41)
BILIRUBIN TOTAL: 1.4 mg/dL — AB (ref 0.3–1.2)
BUN: 14 mg/dL (ref 6–20)
CALCIUM: 9.8 mg/dL (ref 8.9–10.3)
CO2: 18 mmol/L — ABNORMAL LOW (ref 22–32)
Chloride: 99 mmol/L — ABNORMAL LOW (ref 101–111)
Creatinine, Ser: 0.8 mg/dL (ref 0.44–1.00)
GFR calc Af Amer: >60 mL/min (ref >60)
GLUCOSE: 347 mg/dL — AB (ref 65–99)
Potassium: 5.2 mmol/L — ABNORMAL HIGH (ref 3.5–5.1)
Sodium: 135 mmol/L (ref 135–145)
TOTAL PROTEIN: 8.9 g/dL — AB (ref 6.5–8.1)

## 2016-09-26 LAB — RAPID URINE DRUG SCREEN, HOSP PERFORMED
Amphetamines: NOT DETECTED
BARBITURATES: NOT DETECTED
Benzodiazepines: NOT DETECTED
Cocaine: NOT DETECTED
Opiates: NOT DETECTED
TETRAHYDROCANNABINOL: POSITIVE — AB

## 2016-09-26 LAB — URINALYSIS, ROUTINE W REFLEX MICROSCOPIC
Bacteria, UA: NONE SEEN
Bilirubin Urine: NEGATIVE
Glucose, UA: >=500 mg/dL — AB
KETONES UR: 80 mg/dL — AB
NITRITE: NEGATIVE
PROTEIN: 30 mg/dL — AB
RBC / HPF: NONE SEEN RBC/hpf (ref 0–5)
SPECIFIC GRAVITY, URINE: 1.023 (ref 1.005–1.030)
pH: 6 (ref 5.0–8.0)

## 2016-09-26 LAB — I-STAT BETA HCG BLOOD, ED (MC, WL, AP ONLY): I-stat hCG, quantitative: <5 m[IU]/mL (ref <5)

## 2016-09-26 LAB — MRSA PCR SCREENING: MRSA by PCR: NEGATIVE

## 2016-09-26 LAB — MAGNESIUM: Magnesium: 2 mg/dL (ref 1.7–2.4)

## 2016-09-26 LAB — CBG MONITORING, ED
GLUCOSE-CAPILLARY: 360 mg/dL — AB (ref 65–99)
GLUCOSE-CAPILLARY: 269 mg/dL — AB (ref 65–99)
Glucose-Capillary: 235 mg/dL — ABNORMAL HIGH (ref 65–99)

## 2016-09-26 LAB — LIPASE, BLOOD: Lipase: 14 U/L (ref 11–51)

## 2016-09-26 LAB — PREGNANCY, URINE: PREG TEST UR: NEGATIVE

## 2016-09-26 MED ORDER — DEXTROSE-NACL 5-0.45 % IV SOLN
INTRAVENOUS | Status: DC
  Administered 2016-09-26: 18:00:00 via INTRAVENOUS

## 2016-09-26 MED ORDER — ENOXAPARIN SODIUM 40 MG/0.4ML ~~LOC~~ SOLN
40.0000 mg | SUBCUTANEOUS | Status: DC
  Administered 2016-09-26 – 2016-09-28 (×3): 40 mg via SUBCUTANEOUS
  Filled 2016-09-26 (×3): qty 0.4

## 2016-09-26 MED ORDER — POTASSIUM CHLORIDE 10 MEQ/100ML IV SOLN
10.0000 meq | INTRAVENOUS | Status: AC
  Administered 2016-09-26: 10 meq via INTRAVENOUS
  Filled 2016-09-26: qty 100

## 2016-09-26 MED ORDER — HYDROMORPHONE HCL 1 MG/ML IJ SOLN
1.0000 mg | INTRAMUSCULAR | Status: DC | PRN
  Administered 2016-09-26 – 2016-09-27 (×2): 1 mg via INTRAVENOUS
  Filled 2016-09-26 (×2): qty 1

## 2016-09-26 MED ORDER — SODIUM CHLORIDE 0.9 % IV SOLN
INTRAVENOUS | Status: AC
  Administered 2016-09-26: 500 mL/h via INTRAVENOUS

## 2016-09-26 MED ORDER — HALOPERIDOL LACTATE 5 MG/ML IJ SOLN
2.0000 mg | Freq: Once | INTRAMUSCULAR | Status: AC
  Administered 2016-09-26: 2 mg via INTRAVENOUS
  Filled 2016-09-26: qty 1

## 2016-09-26 MED ORDER — ONDANSETRON HCL 4 MG/2ML IJ SOLN
4.0000 mg | Freq: Four times a day (QID) | INTRAMUSCULAR | Status: DC
  Administered 2016-09-26 (×2): 4 mg via INTRAVENOUS
  Filled 2016-09-26 (×2): qty 2

## 2016-09-26 MED ORDER — POTASSIUM CHLORIDE 10 MEQ/100ML IV SOLN
INTRAVENOUS | Status: AC
  Administered 2016-09-26: 10 meq
  Filled 2016-09-26: qty 100

## 2016-09-26 MED ORDER — SODIUM CHLORIDE 0.9 % IV BOLUS (SEPSIS)
1000.0000 mL | Freq: Once | INTRAVENOUS | Status: AC
  Administered 2016-09-26: 1000 mL via INTRAVENOUS

## 2016-09-26 MED ORDER — METOCLOPRAMIDE HCL 5 MG/ML IJ SOLN
10.0000 mg | Freq: Once | INTRAMUSCULAR | Status: AC
  Administered 2016-09-26: 10 mg via INTRAVENOUS
  Filled 2016-09-26: qty 2

## 2016-09-26 MED ORDER — ONDANSETRON HCL 4 MG/2ML IJ SOLN
4.0000 mg | Freq: Four times a day (QID) | INTRAMUSCULAR | Status: DC | PRN
  Administered 2016-09-26 – 2016-09-27 (×2): 4 mg via INTRAVENOUS
  Filled 2016-09-26 (×2): qty 2

## 2016-09-26 MED ORDER — SODIUM CHLORIDE 0.9 % IV SOLN
INTRAVENOUS | Status: AC
  Administered 2016-09-26: 999 mL/h via INTRAVENOUS

## 2016-09-26 MED ORDER — DEXTROSE-NACL 5-0.45 % IV SOLN
INTRAVENOUS | Status: DC
  Administered 2016-09-26 – 2016-09-27 (×2): via INTRAVENOUS

## 2016-09-26 MED ORDER — DIPHENHYDRAMINE HCL 50 MG/ML IJ SOLN
25.0000 mg | Freq: Once | INTRAMUSCULAR | Status: AC
  Administered 2016-09-26: 25 mg via INTRAVENOUS
  Filled 2016-09-26: qty 1

## 2016-09-26 MED ORDER — KETOROLAC TROMETHAMINE 30 MG/ML IJ SOLN
30.0000 mg | Freq: Once | INTRAMUSCULAR | Status: AC
  Administered 2016-09-26: 30 mg via INTRAVENOUS
  Filled 2016-09-26: qty 1

## 2016-09-26 MED ORDER — SODIUM CHLORIDE 0.9 % IV SOLN
INTRAVENOUS | Status: AC
  Administered 2016-09-26: 3 [IU]/h via INTRAVENOUS
  Administered 2016-09-27: 2.5 [IU]/h via INTRAVENOUS
  Filled 2016-09-26 (×2): qty 1

## 2016-09-26 NOTE — ED Notes (Signed)
Radio broadcast assistant for 2 west to start 20 minute timer.  Patient can go up at 1545.

## 2016-09-26 NOTE — ED Notes (Signed)
RN Manuela Schwartz at bedside attempting ultrasound IV

## 2016-09-26 NOTE — ED Notes (Signed)
Pt informed a urine sample is needed. 

## 2016-09-26 NOTE — ED Notes (Signed)
Called and made Megan with respiratory aware that VBG in lab to be ran.

## 2016-09-26 NOTE — ED Provider Notes (Signed)
[facility] DEPT Provider Note   CSN: [id number] Arrival date & time: 09/26/16  0102     History   Chief Complaint Chief Complaint  Patient presents with  . Abdominal Pain  . Emesis    HPI [name] is a 26 y.o. female With history of uncontrolled type 1 diabetes with neuropathy, gastroparesis, marijuana use presents to ED for evaluation of sudden onset nausea, vomiting, abdominal pain starting at 2 AM today. Denies polydipsia, polyuria, dry mouths, fever, chills, cough, chest pain, shortness of breath, dysuria, vaginal discharge or bleeding. States she has been compliant with her insulin regimen. Last saw her endocrinologist 3 weeks ago. Denies suspicious intake of foods or exposure to sick contacts.  HPI  Past Medical History:  Diagnosis Date  . Anxiety   . Arthritis   . Asthma   . Diabetes mellitus 2007   IDDM.  poorly controlled, multiple admits with DKA  . Gallstones   . Gastroparesis   . Heart murmur   . Hepatic steatosis 11/26/2014   and hepatomegaly  . Hypertension    NOT CURRENTLY ON ANY BP MED  . Liver mass 11/26/2014  . Pancreatitis, acute 11/26/2014    Patient Active Problem List   Diagnosis Date Noted  . Thrombocytosis ([place]) 07/08/2016  . Candiduria, asymptomatic 06/23/2016  . Hyponatremia 06/13/2016  . Hematuria 05/13/2016  . UTI (urinary tract infection) 01/22/2016  . Diarrhea 11/09/2015  . Acute urinary retention   . GERD (gastroesophageal reflux disease) 07/10/2015  . Depression with anxiety 07/10/2015  . Gastroparesis 06/22/2015  . Altered mental status 06/22/2015  . Volume depletion 06/10/2015  . Protein-calorie malnutrition, severe 06/10/2015  . Hyperglycemia   . Elevated bilirubin   . Hematemesis with nausea   . Intractable nausea and vomiting 05/28/2015  . Abdominal pain in female   . Abdominal pain 05/24/2015  . Hypertension 05/24/2015  . Dehydration   . Chronic diastolic heart failure ([place]) 04/11/2015  . Hematemesis  04/08/2015  . DKA (diabetic ketoacidoses) ([place]) 03/22/2015  . S/P laparoscopic cholecystectomy 02/11/2015  . Postextubation stridor   . Pancreatitis, acute 11/26/2014  . Volume overload 11/26/2014  . Hypokalemia 11/26/2014  . Hepatic steatosis 11/26/2014  . Liver mass 11/26/2014  . Sepsis ([place]) 11/25/2014  . Sinus tachycardia 11/25/2014  . Hypomagnesemia 11/25/2014  . Hypophosphatemia 11/25/2014  . Elevated LFTs 11/24/2014  . AKI (acute kidney injury) ([place]) 11/24/2014  . Migraine headache 11/24/2014  . Asthma 06/29/2012  . Uncontrolled type 1 diabetes mellitus ([place]) 06/19/2010  . Goiter, unspecified 06/19/2010    Past Surgical History:  Procedure Laterality Date  . CHOLECYSTECTOMY N/A 02/11/2015   Procedure: LAPAROSCOPIC CHOLECYSTECTOMY WITH INTRAOPERATIVE CHOLANGIOGRAM;  Surgeon: Greer Pickerel, MD;  Location: [facility];  Service: General;  Laterality: N/A;  . ESOPHAGOGASTRODUODENOSCOPY (EGD) WITH PROPOFOL Left 09/20/2014   Procedure: ESOPHAGOGASTRODUODENOSCOPY (EGD) WITH PROPOFOL;  Surgeon: Arta Silence, MD;  Location: [facility] ENDOSCOPY;  Service: Endoscopy;  Laterality: Left;  . WISDOM TOOTH EXTRACTION      OB History    Gravida Para Term Preterm AB Living   2 1 0 1 1 1    SAB TAB Ectopic Multiple Live Births   0 1 0 0 1       Home Medications    Prior to Admission medications   Medication Sig Start Date End Date Taking? Authorizing Provider  dicyclomine (BENTYL) 20 MG tablet Take 1 tablet (20 mg total) by mouth 2 (two) times daily. 07/13/16  Yes Volanda Napoleon, PA-C  erythromycin (E-MYCIN) 250  MG tablet Take 1 tablet (250 mg total) by mouth 3 (three) times daily with meals. 07/11/16  Yes Sheikh, Omair Latif, DO  insulin aspart (NOVOLOG FLEXPEN) 100 UNIT/ML FlexPen Inject 1-10 Units into the skin 3 (three) times daily with meals. Pt uses 1 unit for every 10 grams of carbs with meals and snacks for BS greater than 50.   Yes [provider]  insulin glargine (LANTUS) 100  UNIT/ML injection Inject 0.22 mLs (22 Units total) into the skin daily. 06/28/16  Yes Hosie Poisson, MD  meloxicam (MOBIC) 7.5 MG tablet Take 1 tablet (7.5 mg total) by mouth daily. 07/18/16  Yes Patrecia Pour, MD  metoCLOPramide (REGLAN) 10 MG tablet Take 1 tablet (10 mg total) by mouth every 6 (six) hours as needed for nausea. 07/23/16  Yes Orlie Dakin, MD  metoprolol tartrate (LOPRESSOR) 25 MG tablet Take 0.5 tablets (12.5 mg total) by mouth 2 (two) times daily. 05/10/16  Yes Mariel Aloe, MD  ondansetron (ZOFRAN) 4 MG tablet Take 1 tablet (4 mg total) by mouth every 6 (six) hours. 07/13/16  Yes Providence Lanius A, PA-C  RESTASIS MULTIDOSE 0.05 % ophthalmic emulsion Place 1 drop into both eyes every 12 (twelve) hours. 09/08/16  Yes [provider]  Vitamin D, Ergocalciferol, (DRISDOL) 50000 units CAPS capsule Take 50,000 Units by mouth once a week. On fridays 05/26/16  Yes [provider]  glucagon (GLUCAGON EMERGENCY) 1 MG injection Inject 1 mg into the vein once as needed. 06/14/16   Kerney Elbe, DO    Family History Family History  Problem Relation Age of Onset  . Heart disease Maternal Grandmother   . Heart disease Maternal Grandfather   . Diabetes Mother   . Hyperlipidemia Mother   . Hypertension Father   . Heart disease Father   . Hypertension Paternal Grandmother   . Cancer Paternal Grandfather     Social History Social History  Substance Use Topics  . Smoking status: Never Smoker  . Smokeless tobacco: Never Used  . Alcohol use No     Allergies   Peanut-containing drug products; Food; and Ultram [tramadol]   Review of Systems Review of Systems  Constitutional: Negative for chills and fever.  HENT: Negative for congestion and sore throat.   Respiratory: Negative for cough, chest tightness and shortness of breath.   Cardiovascular: Negative for chest pain.  Gastrointestinal: Positive for abdominal pain, nausea and vomiting. Negative for blood in  stool, constipation and diarrhea.  Endocrine: Negative for polydipsia and polyuria.  Genitourinary: Negative for dysuria and frequency.  Skin: Negative for rash.  Allergic/Immunologic: Positive for immunocompromised state.     Physical Exam Updated Vital Signs BP 140/86 (BP Location: Left Arm)   Pulse (!) 120   Temp (!) 97.4 F (36.3 C) (Oral)   Resp (!) 22   LMP 09/13/2016   SpO2 100%   Physical Exam  Constitutional: She is oriented to person, place, and time. She appears well-developed and well-nourished. No distress.  Actively vomiting, rocking in bed back and forth  HENT:  Head: Normocephalic and atraumatic.  Right Ear: External ear normal.  Left Ear: External ear normal.  Nose: Nose normal.  Moist mucous membranes Oropharynx and tonsils normal  Eyes: Conjunctivae and EOM are normal. No scleral icterus.  Neck: Normal range of motion. Neck supple.  Cardiovascular: Normal rate, regular rhythm and normal heart sounds.   No murmur heard. Pulmonary/Chest: Effort normal and breath sounds normal. She has no wheezes.  Abdominal:  Soft. She exhibits no distension. There is tenderness.  Diffuse abdominal tenderness without rigidity or rebound No suprapubic or CVA tenderness  Musculoskeletal: Normal range of motion. She exhibits no deformity.  Neurological: She is alert and oriented to person, place, and time.  Skin: Skin is warm and dry. Capillary refill takes less than 2 seconds.  Psychiatric: She has a normal mood and affect. Her behavior is normal. Judgment and thought content normal.  Nursing note and vitals reviewed.    ED Treatments / Results  Labs (all labs ordered are listed, but only abnormal results are displayed) Labs Reviewed  COMPREHENSIVE METABOLIC PANEL - Abnormal; Notable for the following:       Result Value   Potassium 5.2 (*)    Chloride 99 (*)    CO2 18 (*)    Glucose, Bld 347 (*)    Total Protein 8.9 (*)    AST 47 (*)    Total Bilirubin 1.4 (*)      Anion gap 18 (*)    All other components within normal limits  CBC - Abnormal; Notable for the following:    WBC 12.5 (*)    Platelets 488 (*)    All other components within normal limits  URINALYSIS, ROUTINE W REFLEX MICROSCOPIC - Abnormal; Notable for the following:    APPearance CLOUDY (*)    Glucose, UA >=500 (*)    Hgb urine dipstick SMALL (*)    Ketones, ur 80 (*)    Protein, ur 30 (*)    Leukocytes, UA LARGE (*)    Squamous Epithelial / LPF TOO NUMEROUS TO COUNT (*)    Non Squamous Epithelial 0-5 (*)    All other components within normal limits  RAPID URINE DRUG SCREEN, HOSP PERFORMED - Abnormal; Notable for the following:    Tetrahydrocannabinol POSITIVE (*)    All other components within normal limits  BLOOD GAS, VENOUS - Abnormal; Notable for the following:    pH, Ven 7.245 (*)    pCO2, Ven 35.2 (*)    Bicarbonate 14.7 (*)    Acid-base deficit 11.4 (*)    All other components within normal limits  CBG MONITORING, ED - Abnormal; Notable for the following:    Glucose-Capillary 235 (*)    All other components within normal limits  CBG MONITORING, ED - Abnormal; Notable for the following:    Glucose-Capillary 360 (*)    All other components within normal limits  LIPASE, BLOOD  MAGNESIUM  I-STAT BETA HCG BLOOD, ED ([facility], [facility], [facility] ONLY)    EKG  EKG Interpretation None       Radiology No results found.  Procedures Procedures (including critical care time)  Medications Ordered in ED Medications  insulin regular (NOVOLIN R,HUMULIN R) 100 Units in sodium chloride 0.9 % 100 mL (1 Units/mL) infusion (3 Units/hr Intravenous New Bag/Given 09/26/16 1405)  dextrose 5 %-0.45 % sodium chloride infusion ( Intravenous Hold 09/26/16 1411)  metoCLOPramide (REGLAN) injection 10 mg (10 mg Intravenous Given 09/26/16 1116)  haloperidol lactate (HALDOL) injection 2 mg (2 mg Intravenous Given 09/26/16 1119)  sodium chloride 0.9 % bolus 1,000 mL (0 mLs Intravenous Stopped 09/26/16 1441)   sodium chloride 0.9 % bolus 1,000 mL (0 mLs Intravenous Stopped 09/26/16 1405)  diphenhydrAMINE (BENADRYL) injection 25 mg (25 mg Intravenous Given 09/26/16 1256)  haloperidol lactate (HALDOL) injection 2 mg (2 mg Intravenous Given 09/26/16 1259)  ketorolac (TORADOL) 30 MG/ML injection 30 mg (30 mg Intravenous Given 09/26/16 1300)  Initial Impression / Assessment and Plan / ED Course  I have reviewed the triage vital signs and the nursing notes.  Pertinent labs & imaging results that were available during my care of the patient were reviewed by me and considered in my medical decision making (see chart for details).  Clinical Course as of Sep 26 1448  Sat Sep 26, 2016  1240 Glucose: (!) 347 [CG]  1240 Anion gap: (!) 18 [CG]  1240 WBC: (!) 12.5 [CG]  1241 Potassium: (!) 5.2 [CG]  1243 CO2: (!) 18 [CG]  1348 Squamous Epithelial / LPF: (!) TOO NUMEROUS TO COUNT [CG]  1349 WBC, UA: TOO NUMEROUS TO COUNT [CG]  1349 Leukocytes, UA: (!) LARGE [CG]  1349 Glucose-Capillary: (!) 235 [CG]    Clinical Course User Index [CG] Kinnie Feil, PA-C   Pt is a 26 y.o. female with hx uncontrolled T1DM and gastroparesis who presents with nausea, NBNB emesis, abdominal pain onset 0200 today.  Denies constitutional symptoms to direct to possible cause of DKA. No fever, chills, cough, CP, diarrhea, dysuria. No known sick contacts. States she has been compliant with insulin regimen. On exam, pt looks uncomfortable, actively vomiting and rocking in bed back and forth. Tachycardic. Afebrile. Cardiopulmonary exam benign. Abdomen soft but diffusely tender, no signs of peritonitis. Initial CBG 347 > 235 >360 with insulin drip. CBC 12.5. Potassium 5.2. Gap 18. UA is contaminated, with 80 ketones, large leukocytes, TNTC WBCs and no bacteria. Patient given haldol x 2, reglan x1, benadryl, toradol, 2 L IVF in ED with partial control of n/v and abdominal pain.   Will request admission for DKA.   Final Clinical  Impressions(s) / ED Diagnoses   Final diagnoses:  Type 1 diabetes mellitus with ketoacidosis without coma [facility])    New Prescriptions New Prescriptions   No medications on file     Arlean Hopping 09/26/16 [place], [place], MD 10/01/16 1009

## 2016-09-26 NOTE — ED Notes (Signed)
Informed PA Rosemarie Ax that pain has not been relieved. Waiting for orders to be put in.

## 2016-09-26 NOTE — ED Notes (Addendum)
Verbal order from hospitalist to administer Normal Saline 1 liter bolus due to patient's HR >120. New bag hung.

## 2016-09-26 NOTE — H&P (Signed)
History and Physical    [name] LZJ:[id number] DOB: 12-14-1990 DOA: 09/26/2016  PCP: Vicenta Aly, FNP Patient coming from home  I have personally briefly reviewed patient's old medical records in [place]  Chief Complaint:   HPI: [name] is a 26 y.o. female with medical history significant of type 1 diabetes mellitus, admitted with hyperglycemia nausea vomiting abdominal pain. Patient has had multiple admissions for DKA. She reports that she has severe gastroparesis. In when her gastroparesis starts getting worse her blood sugar goes up. She reports taking her insulin as ordered by the doctor. She denies missing any doses. She denies any urinary complaints. She denies any chest pain shortness of breath. She denies any headache fever or chills. She was admitted for the same reason 3 months ago. She lives at home with her mother.   ED Course: In the ER she received IV fluids started on IV insulin. Her hemoglobin was 12.9 white count 12.5 platelet count 488 sodium 135 potassium 5.2 BUN 14 creatinine 0.80 her lipase was 14 pH was 7.24 with a CO2 of 35, T bili was 1.4 AST 47 ALT 17 alkaline phosphatase was 121. She remained tachycardic in the ER after 1 IV bolus. She persistently complained of being thirsty she was awake able to follow conversation.   Review of Systems: As per HPI otherwise 10 point review of systems negative.    Past Medical History:  Diagnosis Date  . Anxiety   . Arthritis   . Asthma   . Diabetes mellitus 2007   IDDM.  poorly controlled, multiple admits with DKA  . Gallstones   . Gastroparesis   . Heart murmur   . Hepatic steatosis 11/26/2014   and hepatomegaly  . Hypertension    NOT CURRENTLY ON ANY BP MED  . Liver mass 11/26/2014  . Pancreatitis, acute 11/26/2014    Past Surgical History:  Procedure Laterality Date  . CHOLECYSTECTOMY N/A 02/11/2015   Procedure: LAPAROSCOPIC CHOLECYSTECTOMY WITH INTRAOPERATIVE CHOLANGIOGRAM;  Surgeon:  Greer Pickerel, MD;  Location: [facility] ORS;  Service: General;  Laterality: N/A;  . ESOPHAGOGASTRODUODENOSCOPY (EGD) WITH PROPOFOL Left 09/20/2014   Procedure: ESOPHAGOGASTRODUODENOSCOPY (EGD) WITH PROPOFOL;  Surgeon: Arta Silence, MD;  Location: [facility] ENDOSCOPY;  Service: Endoscopy;  Laterality: Left;  . WISDOM TOOTH EXTRACTION       reports that she has never smoked. She has never used smokeless tobacco. She reports that she uses drugs, including Marijuana. She reports that she does not drink alcohol.  Allergies  Allergen Reactions  . Peanut-Containing Drug Products Swelling and Other (See Comments)    Reaction:  Swelling of mouth and lips   . 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   Reaction:  Swelling of mouth and lips   . Ultram [Tramadol] Itching    Family History  Problem Relation Age of Onset  . Heart disease Maternal Grandmother   . Heart disease Maternal Grandfather   . Diabetes Mother   . Hyperlipidemia Mother   . Hypertension Father   . Heart disease Father   . Hypertension Paternal Grandmother   . Cancer Paternal Grandfather    Unacceptable: Noncontributory, unremarkable, or negative. Acceptable: Family history reviewed and not pertinent (If you reviewed it)  Prior to Admission medications   Medication Sig Start Date End Date Taking? Authorizing Provider  dicyclomine (BENTYL) 20 MG tablet Take 1 tablet (20 mg total) by mouth 2 (two) times daily. 07/13/16  Yes Volanda Napoleon, PA-C  erythromycin (E-MYCIN) 250 MG tablet Take 1 tablet (250 mg total) by mouth 3 (three) times daily with meals. 07/11/16  Yes Sheikh, Omair Latif, DO  insulin aspart (NOVOLOG FLEXPEN) 100 UNIT/ML FlexPen Inject 1-10 Units into the skin 3 (three) times daily with meals. Pt uses 1 unit for every 10 grams of carbs with meals and snacks for BS greater than 50.   Yes [provider]  insulin glargine (LANTUS) 100 UNIT/ML injection Inject 0.22 mLs (22 Units total)  into the skin daily. 06/28/16  Yes Hosie Poisson, MD  meloxicam (MOBIC) 7.5 MG tablet Take 1 tablet (7.5 mg total) by mouth daily. 07/18/16  Yes Patrecia Pour, MD  metoCLOPramide (REGLAN) 10 MG tablet Take 1 tablet (10 mg total) by mouth every 6 (six) hours as needed for nausea. 07/23/16  Yes Orlie Dakin, MD  metoprolol tartrate (LOPRESSOR) 25 MG tablet Take 0.5 tablets (12.5 mg total) by mouth 2 (two) times daily. 05/10/16  Yes Mariel Aloe, MD  ondansetron (ZOFRAN) 4 MG tablet Take 1 tablet (4 mg total) by mouth every 6 (six) hours. 07/13/16  Yes Providence Lanius A, PA-C  RESTASIS MULTIDOSE 0.05 % ophthalmic emulsion Place 1 drop into both eyes every 12 (twelve) hours. 09/08/16  Yes [provider]  Vitamin D, Ergocalciferol, (DRISDOL) 50000 units CAPS capsule Take 50,000 Units by mouth once a week. On fridays 05/26/16  Yes [provider]  glucagon (GLUCAGON EMERGENCY) 1 MG injection Inject 1 mg into the vein once as needed. 06/14/16   Raiford Noble [place], DO    Physical Exam: Vitals:   09/26/16 0915 09/26/16 1208 09/26/16 1441 09/26/16 1445  BP: (!) 128/95 128/77 140/86 131/80  Pulse: (!) 110 (!) 114 (!) 120 (!) 125  Resp: (!) 22 18 (!) 22 (!) 25  Temp: (!) 97.4 F (36.3 C)     TempSrc: Oral     SpO2: 100% 100% 100% 100%    Constitutional: NAD, calm, comfortable Vitals:   09/26/16 0915 09/26/16 1208 09/26/16 1441 09/26/16 1445  BP: (!) 128/95 128/77 140/86 131/80  Pulse: (!) 110 (!) 114 (!) 120 (!) 125  Resp: (!) 22 18 (!) 22 (!) 25  Temp: (!) 97.4 F (36.3 C)     TempSrc: Oral     SpO2: 100% 100% 100% 100%   Eyes: PERRL, lids and conjunctivae normal ENMT: Mucous membranes are very dry. Posterior pharynx clear of any exudate or lesions.Normal dentition.  Neck: normal, supple, no masses, no thyromegaly Respiratory: clear to auscultation bilaterally, no wheezing, no crackles. Normal respiratory effort. No accessory muscle use.  Cardiovascular: Regular rate and  rhythm, no murmurs / rubs / gallops. No extremity edema. 2+ pedal pulses. No carotid bruits.  Abdomen: no tenderness, no masses palpated. No hepatosplenomegaly. Bowel sounds positive.  Musculoskeletal: no clubbing / cyanosis. No joint deformity upper and lower extremities. Good ROM, no contractures. Normal muscle tone.  Skin: no rashes, lesions, ulcers. No induration Neurologic: CN 2-12 grossly intact. Sensation intact, DTR normal. Strength 5/5 in all 4.  Psychiatric: Normal judgment and insight. Alert and oriented x 3. Normal mood  Labs on Admission: I have personally reviewed following labs and imaging studies  CBC:  Recent Labs Lab 09/26/16 1059  WBC 12.5*  HGB 12.9  HCT 38.7  MCV 86.4  PLT 161*   Basic Metabolic Panel:  Recent Labs Lab 09/26/16 1059  NA 135  K 5.2*  CL 99*  CO2 18*  GLUCOSE 347*  BUN 14  CREATININE  0.80  CALCIUM 9.8  MG 2.0   GFR: CrCl cannot be calculated (Unknown ideal weight.). Liver Function Tests:  Recent Labs Lab 09/26/16 1059  AST 47*  ALT 17  ALKPHOS 121  BILITOT 1.4*  PROT 8.9*  ALBUMIN 4.6    Recent Labs Lab 09/26/16 1059  LIPASE 14   No results for input(s): AMMONIA in the last 168 hours. Coagulation Profile: No results for input(s): INR, PROTIME in the last 168 hours. Cardiac Enzymes: No results for input(s): CKTOTAL, CKMB, CKMBINDEX, TROPONINI in the last 168 hours. BNP (last 3 results) No results for input(s): PROBNP in the last 8760 hours. HbA1C: No results for input(s): HGBA1C in the last 72 hours. CBG:  Recent Labs Lab 09/26/16 0922 09/26/16 1353 09/26/16 1513  GLUCAP 235* 360* 269*   Lipid Profile: No results for input(s): CHOL, HDL, LDLCALC, TRIG, CHOLHDL, LDLDIRECT in the last 72 hours. Thyroid Function Tests: No results for input(s): TSH, T4TOTAL, FREET4, T3FREE, THYROIDAB in the last 72 hours. Anemia Panel: No results for input(s): VITAMINB12, FOLATE, FERRITIN, TIBC, IRON, RETICCTPCT in the last  72 hours. Urine analysis:    Component Value Date/Time   COLORURINE YELLOW 09/26/2016 1306   APPEARANCEUR CLOUDY (A) 09/26/2016 1306   LABSPEC 1.023 09/26/2016 1306   PHURINE 6.0 09/26/2016 1306   GLUCOSEU >=500 (A) 09/26/2016 1306   GLUCOSEU >=1000 11/07/2012 1205   HGBUR SMALL (A) 09/26/2016 1306   BILIRUBINUR NEGATIVE 09/26/2016 1306   KETONESUR 80 (A) 09/26/2016 1306   PROTEINUR 30 (A) 09/26/2016 1306   UROBILINOGEN 0.2 11/24/2014 1045   NITRITE NEGATIVE 09/26/2016 1306   LEUKOCYTESUR LARGE (A) 09/26/2016 1306    Radiological Exams on Admission: No results found.  EKG: Independently reviewed.   Assessment/Plan Active Problems:   DKA, type 1 ([place]) DKA with high gap metabolic acidosis continue with IV insulin drip and IV hydration. Restart home insulin tomorrow or as a gap closes.  Leukocytosis probably related to DKA. There is no evidence of infection. At this time.  Elevated LFTs T bili mildly elevated bilirubin 1.4  follow-up levels.   gastroparesis continue with Reglan.   DVT prophylaxis:lovenox Code Status:  Family Communication:  Disposition Plan:  Consults called:  Admission status:   Georgette Shell MD [facility]  If 7PM-7AM, please contact night-coverage [email or web address] Password [email or web address]  09/26/2016, 3:55 PM

## 2016-09-26 NOTE — ED Triage Notes (Signed)
Pt with abdominal pain n/v since last night.  Pt has type 1 diabetes.  Pt has been taking meds as prescribed. Normal cbg at home.  No fever.

## 2016-09-27 DIAGNOSIS — K3184 Gastroparesis: Secondary | ICD-10-CM

## 2016-09-27 DIAGNOSIS — E1010 Type 1 diabetes mellitus with ketoacidosis without coma: Principal | ICD-10-CM

## 2016-09-27 DIAGNOSIS — E43 Unspecified severe protein-calorie malnutrition: Secondary | ICD-10-CM

## 2016-09-27 LAB — BASIC METABOLIC PANEL
ANION GAP: 7 (ref 5–15)
Anion gap: 6 (ref 5–15)
BUN: 13 mg/dL (ref 6–20)
BUN: 12 mg/dL (ref 6–20)
CALCIUM: 8.7 mg/dL — AB (ref 8.9–10.3)
CO2: 19 mmol/L — ABNORMAL LOW (ref 22–32)
CO2: 20 mmol/L — ABNORMAL LOW (ref 22–32)
Calcium: 8.5 mg/dL — ABNORMAL LOW (ref 8.9–10.3)
Chloride: 113 mmol/L — ABNORMAL HIGH (ref 101–111)
Chloride: 113 mmol/L — ABNORMAL HIGH (ref 101–111)
Creatinine, Ser: 0.62 mg/dL (ref 0.44–1.00)
Creatinine, Ser: 0.55 mg/dL (ref 0.44–1.00)
GFR calc Af Amer: >60 mL/min (ref >60)
GFR calc Af Amer: >60 mL/min (ref >60)
Glucose, Bld: 126 mg/dL — ABNORMAL HIGH (ref 65–99)
Glucose, Bld: 149 mg/dL — ABNORMAL HIGH (ref 65–99)
POTASSIUM: 4.2 mmol/L (ref 3.5–5.1)
POTASSIUM: 3.8 mmol/L (ref 3.5–5.1)
SODIUM: 139 mmol/L (ref 135–145)
SODIUM: 139 mmol/L (ref 135–145)

## 2016-09-27 LAB — CBC WITH DIFFERENTIAL/PLATELET
Basophils Absolute: 0 10*3/uL (ref 0.0–0.1)
Basophils Relative: 0 %
Eosinophils Absolute: 0 10*3/uL (ref 0.0–0.7)
Eosinophils Relative: 0 %
HEMATOCRIT: 33.6 % — AB (ref 36.0–46.0)
Hemoglobin: 10.9 g/dL — ABNORMAL LOW (ref 12.0–15.0)
LYMPHS PCT: 16 %
Lymphs Abs: 1.4 10*3/uL (ref 0.7–4.0)
MCH: 28.5 pg (ref 26.0–34.0)
MCHC: 32.4 g/dL (ref 30.0–36.0)
MCV: 87.7 fL (ref 78.0–100.0)
MONO ABS: 0.8 10*3/uL (ref 0.1–1.0)
MONOS PCT: 9 %
NEUTROS ABS: 6.6 10*3/uL (ref 1.7–7.7)
Neutrophils Relative %: 75 %
Platelets: 403 10*3/uL — ABNORMAL HIGH (ref 150–400)
RBC: 3.83 MIL/uL — ABNORMAL LOW (ref 3.87–5.11)
RDW: 14.8 % (ref 11.5–15.5)
WBC: 8.9 10*3/uL (ref 4.0–10.5)

## 2016-09-27 LAB — GLUCOSE, CAPILLARY
GLUCOSE-CAPILLARY: 120 mg/dL — AB (ref 65–99)
GLUCOSE-CAPILLARY: 121 mg/dL — AB (ref 65–99)
GLUCOSE-CAPILLARY: 145 mg/dL — AB (ref 65–99)
GLUCOSE-CAPILLARY: 155 mg/dL — AB (ref 65–99)
GLUCOSE-CAPILLARY: 155 mg/dL — AB (ref 65–99)
GLUCOSE-CAPILLARY: 156 mg/dL — AB (ref 65–99)
GLUCOSE-CAPILLARY: 186 mg/dL — AB (ref 65–99)
GLUCOSE-CAPILLARY: 190 mg/dL — AB (ref 65–99)
GLUCOSE-CAPILLARY: 274 mg/dL — AB (ref 65–99)
Glucose-Capillary: 134 mg/dL — ABNORMAL HIGH (ref 65–99)
Glucose-Capillary: 143 mg/dL — ABNORMAL HIGH (ref 65–99)
Glucose-Capillary: 155 mg/dL — ABNORMAL HIGH (ref 65–99)
Glucose-Capillary: 218 mg/dL — ABNORMAL HIGH (ref 65–99)
Glucose-Capillary: 197 mg/dL — ABNORMAL HIGH (ref 65–99)

## 2016-09-27 LAB — COMPREHENSIVE METABOLIC PANEL
ALT: 10 U/L — ABNORMAL LOW (ref 14–54)
ANION GAP: 8 (ref 5–15)
AST: 14 U/L — ABNORMAL LOW (ref 15–41)
Albumin: 3.7 g/dL (ref 3.5–5.0)
Alkaline Phosphatase: 88 U/L (ref 38–126)
BILIRUBIN TOTAL: 0.6 mg/dL (ref 0.3–1.2)
BUN: 13 mg/dL (ref 6–20)
CO2: 18 mmol/L — ABNORMAL LOW (ref 22–32)
Calcium: 8.5 mg/dL — ABNORMAL LOW (ref 8.9–10.3)
Chloride: 113 mmol/L — ABNORMAL HIGH (ref 101–111)
Creatinine, Ser: 0.59 mg/dL (ref 0.44–1.00)
GFR calc Af Amer: >60 mL/min (ref >60)
Glucose, Bld: 180 mg/dL — ABNORMAL HIGH (ref 65–99)
POTASSIUM: 3.7 mmol/L (ref 3.5–5.1)
Sodium: 139 mmol/L (ref 135–145)
TOTAL PROTEIN: 6.7 g/dL (ref 6.5–8.1)

## 2016-09-27 MED ORDER — HYDROMORPHONE HCL 1 MG/ML IJ SOLN
1.0000 mg | INTRAMUSCULAR | Status: DC | PRN
  Administered 2016-09-27 – 2016-09-28 (×9): 1 mg via INTRAVENOUS
  Filled 2016-09-27 (×9): qty 1

## 2016-09-27 MED ORDER — METOCLOPRAMIDE HCL 5 MG/ML IJ SOLN
10.0000 mg | Freq: Three times a day (TID) | INTRAMUSCULAR | Status: DC
  Administered 2016-09-27 – 2016-09-29 (×7): 10 mg via INTRAVENOUS
  Filled 2016-09-27 (×7): qty 2

## 2016-09-27 MED ORDER — INSULIN ASPART 100 UNIT/ML ~~LOC~~ SOLN
0.0000 [IU] | Freq: Three times a day (TID) | SUBCUTANEOUS | Status: DC
  Administered 2016-09-27: 3 [IU] via SUBCUTANEOUS
  Administered 2016-09-27: 5 [IU] via SUBCUTANEOUS
  Administered 2016-09-28: 1 [IU] via SUBCUTANEOUS
  Administered 2016-09-28: 7 [IU] via SUBCUTANEOUS
  Administered 2016-09-29: 5 [IU] via SUBCUTANEOUS
  Administered 2016-09-29: 3 [IU] via SUBCUTANEOUS

## 2016-09-27 MED ORDER — INSULIN GLARGINE 100 UNIT/ML ~~LOC~~ SOLN
22.0000 [IU] | Freq: Every day | SUBCUTANEOUS | Status: DC
Start: 2016-09-27 — End: 2016-09-28
  Administered 2016-09-27: 22 [IU] via SUBCUTANEOUS
  Filled 2016-09-27 (×2): qty 0.22

## 2016-09-27 MED ORDER — ONDANSETRON HCL 4 MG/2ML IJ SOLN
4.0000 mg | INTRAMUSCULAR | Status: DC | PRN
  Administered 2016-09-27 (×3): 4 mg via INTRAVENOUS
  Filled 2016-09-27 (×3): qty 2

## 2016-09-27 NOTE — Progress Notes (Signed)
PROGRESS NOTE    [name]  TOI:[id number] DOB: 1990-09-11 DOA: 09/26/2016 PCP: Vicenta Aly, FNP  Brief Narrative: [name] is a 26 y.o. female with medical history significant of type 1 diabetes mellitus, admitted with hyperglycemia nausea vomiting abdominal pain. Patient has had multiple admissions for DKA. She reports that she has severe gastroparesis. In when her gastroparesis starts getting worse her blood sugar goes up. She reports taking her insulin .    Assessment & Plan:     DKA -resolved -resume lantus, transition off Insulin gtt -change IVF to NS -clears and advance as tolerated    Leukocytosis  -reactive, resolved    Uncontrolled type 1 diabetes mellitus ([place]) -last Hba1c was 7.7 2 months ago -resume lantus and SSI    Protein-calorie malnutrition, severe -supplements as tol    Severe Gastroparesis -add reglan    GERD (gastroesophageal reflux disease) -PPI   DVT prophylaxis: lovenox Code Status: Full Code Family Communication: none at bedside Disposition Plan: Tx to floor later today     Subjective: Still with nausea and abd pain  Objective: Vitals:   09/27/16 0600 09/27/16 0700 09/27/16 0800 09/27/16 0900  BP: 117/75 135/77 (!) 159/88 (!) 165/104  Pulse: (!) 106 (!) 106 (!) 112 (!) 117  Resp: (!) 9 15 17 15   Temp:   98.8 F (37.1 C)   TempSrc:   Oral   SpO2: 98% 100% 100% 99%  Weight:   54.5 kg (120 lb 2.4 oz)   Height:   5\' 4"  (1.626 m)     Intake/Output Summary (Last 24 hours) at 09/27/16 1125 Last data filed at 09/27/16 0900  Gross per 24 hour  Intake          2754.39 ml  Output              400 ml  Net          2354.39 ml   Filed Weights   09/27/16 0800  Weight: 54.5 kg (120 lb 2.4 oz)    Examination:  General exam: Appears calm and uncomfortable, chronically ill appearing Respiratory system: Clear to auscultation. Respiratory effort normal. Cardiovascular system: S1 & S2 heard, RRR. No JVD, murmurs, rubs,  gallops or clicks. No pedal edema. Gastrointestinal system: Abdomen is nondistended, soft and nontender. No organomegaly or masses felt. Normal bowel sounds heard. Central nervous system: Alert and oriented. No focal neurological deficits. Extremities: Symmetric 5 x 5 power. Skin: No rashes, lesions or ulcers Psychiatry: Judgement and insight appear normal. Mood & affect appropriate.     Data Reviewed:   CBC:  Recent Labs Lab 09/26/16 1059 09/27/16 0334  WBC 12.5* 8.9  NEUTROABS  --  6.6  HGB 12.9 10.9*  HCT 38.7 33.6*  MCV 86.4 87.7  PLT 488* 833*   Basic Metabolic Panel:  Recent Labs Lab 09/26/16 1059 09/26/16 1625 09/26/16 2315 09/27/16 0334 09/27/16 0700  NA 135 140 139 139 139  K 5.2* 4.7 4.2 3.7 3.8  CL 99* 112* 113* 113* 113*  CO2 18* 14* 19* 18* 20*  GLUCOSE 347* 277* 126* 180* 149*  BUN 14 12 13 13 12   CREATININE 0.80 0.63 0.62 0.59 0.55  CALCIUM 9.8 8.9 8.5* 8.5* 8.7*  MG 2.0  --   --   --   --    GFR: Estimated Creatinine Clearance: 91.7 mL/min (by C-G formula based on SCr of 0.55 mg/dL). Liver Function Tests:  Recent Labs Lab 09/26/16 1059 09/27/16 0334  AST 47* 14*  ALT 17 10*  ALKPHOS 121 88  BILITOT 1.4* 0.6  PROT 8.9* 6.7  ALBUMIN 4.6 3.7    Recent Labs Lab 09/26/16 1059  LIPASE 14   No results for input(s): AMMONIA in the last 168 hours. Coagulation Profile: No results for input(s): INR, PROTIME in the last 168 hours. Cardiac Enzymes: No results for input(s): CKTOTAL, CKMB, CKMBINDEX, TROPONINI in the last 168 hours. BNP (last 3 results) No results for input(s): PROBNP in the last 8760 hours. HbA1C: No results for input(s): HGBA1C in the last 72 hours. CBG:  Recent Labs Lab 09/27/16 0629 09/27/16 0732 09/27/16 0836 09/27/16 0941 09/27/16 1025  GLUCAP 156* 121* 190* 155* 145*   Lipid Profile: No results for input(s): CHOL, HDL, LDLCALC, TRIG, CHOLHDL, LDLDIRECT in the last 72 hours. Thyroid Function Tests: No  results for input(s): TSH, T4TOTAL, FREET4, T3FREE, THYROIDAB in the last 72 hours. Anemia Panel: No results for input(s): VITAMINB12, FOLATE, FERRITIN, TIBC, IRON, RETICCTPCT in the last 72 hours. Urine analysis:    Component Value Date/Time   COLORURINE YELLOW 09/26/2016 1306   APPEARANCEUR CLOUDY (A) 09/26/2016 1306   LABSPEC 1.023 09/26/2016 1306   PHURINE 6.0 09/26/2016 1306   GLUCOSEU >=500 (A) 09/26/2016 1306   GLUCOSEU >=1000 11/07/2012 1205   HGBUR SMALL (A) 09/26/2016 1306   BILIRUBINUR NEGATIVE 09/26/2016 1306   KETONESUR 80 (A) 09/26/2016 1306   PROTEINUR 30 (A) 09/26/2016 1306   UROBILINOGEN 0.2 11/24/2014 1045   NITRITE NEGATIVE 09/26/2016 1306   LEUKOCYTESUR LARGE (A) 09/26/2016 1306   Sepsis Labs: @LABRCNTIP (procalcitonin:4,lacticidven:4)  ) Recent Results (from the past 240 hour(s))  MRSA PCR Screening     Status: None   Collection Time: 09/26/16  4:11 PM  Result Value Ref Range Status   MRSA by PCR NEGATIVE NEGATIVE Final    Comment:        The GeneXpert MRSA Assay (FDA approved for NASAL specimens only), is one component of a comprehensive MRSA colonization surveillance program. It is not intended to diagnose MRSA infection nor to guide or monitor treatment for MRSA infections.          Radiology Studies: No results found.      Scheduled Meds: . enoxaparin (LOVENOX) injection  40 mg Subcutaneous Q24H  . insulin aspart  0-9 Units Subcutaneous TID WC  . insulin glargine  22 Units Subcutaneous Daily  . metoCLOPramide (REGLAN) injection  10 mg Intravenous TID AC   Continuous Infusions: . sodium chloride 75 mL/hr at 09/27/16 0830     LOS: 1 day    Time spent: 39min    Domenic Polite, MD [facility] Pager [phone]  If 7PM-7AM, please contact night-coverage [email or web address] Password [email or web address] 09/27/2016, 11:25 AM

## 2016-09-28 LAB — BASIC METABOLIC PANEL
Anion gap: 14 (ref 5–15)
BUN: 9 mg/dL (ref 6–20)
CALCIUM: 8.5 mg/dL — AB (ref 8.9–10.3)
CO2: 16 mmol/L — AB (ref 22–32)
CREATININE: 0.61 mg/dL (ref 0.44–1.00)
Chloride: 103 mmol/L (ref 101–111)
GFR calc non Af Amer: >60 mL/min (ref >60)
GLUCOSE: 323 mg/dL — AB (ref 65–99)
Potassium: 3.9 mmol/L (ref 3.5–5.1)
Sodium: 133 mmol/L — ABNORMAL LOW (ref 135–145)

## 2016-09-28 LAB — GLUCOSE, CAPILLARY
GLUCOSE-CAPILLARY: 134 mg/dL — AB (ref 65–99)
Glucose-Capillary: 302 mg/dL — ABNORMAL HIGH (ref 65–99)
Glucose-Capillary: 145 mg/dL — ABNORMAL HIGH (ref 65–99)
Glucose-Capillary: 94 mg/dL (ref 65–99)
Glucose-Capillary: 207 mg/dL — ABNORMAL HIGH (ref 65–99)

## 2016-09-28 MED ORDER — INSULIN GLARGINE 100 UNIT/ML ~~LOC~~ SOLN
35.0000 [IU] | Freq: Every day | SUBCUTANEOUS | Status: DC
  Administered 2016-09-28 – 2016-09-29 (×2): 35 [IU] via SUBCUTANEOUS
  Filled 2016-09-28 (×2): qty 0.35

## 2016-09-28 MED ORDER — HYDROMORPHONE HCL-NACL 0.5-0.9 MG/ML-% IV SOSY
1.0000 mg | PREFILLED_SYRINGE | INTRAVENOUS | Status: DC | PRN
  Administered 2016-09-28 (×2): 1 mg via INTRAVENOUS
  Filled 2016-09-28 (×2): qty 2

## 2016-09-28 NOTE — Care Management Note (Signed)
Case Management Note  Patient Details  Name: Shakima Nisley MRN: [id number] Date of Birth: 01/06/1991  Subjective/Objective:      dka              Action/Plan: Date:  September 28, 2016 Chart reviewed for concurrent status and case management needs. Will continue to follow patient progress. Discharge Planning: following for needs Expected discharge date: [id number] Velva Harman, BSN, [place], [place]  Expected Discharge Date:                  Expected Discharge Plan:  Home/Self Care  In-House Referral:     Discharge planning Services  CM Consult  Post Acute Care Choice:    Choice offered to:     DME Arranged:    DME Agency:     HH Arranged:    HH Agency:     Status of Service:  In process, will continue to follow  If discussed at Long Length of Stay Meetings, dates discussed:    Additional Comments:  Leeroy Cha, RN 09/28/2016, 8:47 AM

## 2016-09-28 NOTE — Progress Notes (Signed)
Inpatient Diabetes Program Recommendations  AACE/ADA: New Consensus Statement on Inpatient Glycemic Control (2015)  Target Ranges:  Prepandial:   less than 140 mg/dL      Peak postprandial:   less than 180 mg/dL (1-2 hours)      Critically ill patients:  140 - 180 mg/dL   Lab Results  Component Value Date   GLUCAP 145 (H) 09/28/2016   HGBA1C 7.7 (H) 07/16/2016    Review of Glycemic Control  Diabetes history: DM1 Outpatient Diabetes medications: Lantus 10 units daily + Novolog meal coverage 1 unit/10 gms Carbohyrate Current orders for Inpatient glycemic control: Lantus 35 units qd + Novolog sensitive correction tid  Inpatient Diabetes Program Recommendations:    Please consider the following:  1. Reduce Lantus to 10 units QHS (home dose)  2. Start Custom Novolog Meal Coverage: Novolog 1 unit for every 10 grams of Carbohydrates TID with meals  3. Novolog 0-5 units hs scale  Per endocrinologist note on 09/03/16: "Patient Instructions - Stefanie Apley, MD - 09/03/2016 11:36 AM EDT 1. Continue Lantus Solostar 10 units daily. Switch to [name] 10 units daily when you run out of Lantus. 2. Continue Novolog Flexpen 1 unit per 10 grams carbohydrates AFTER meals/snacks plus correction scale 1 unit per glucose 50 above 150."  Thank you, Bethena Roys E. Weslie Rasmus, RN, MSN, CDE  Diabetes Coordinator Inpatient Glycemic Control Team Team Pager [phone] (8am-5pm) 09/28/2016 1:26 PM

## 2016-09-28 NOTE — Progress Notes (Signed)
PROGRESS NOTE    Raul Winterhalter  QIW:[id number] DOB: 06/25/1990 DOA: 09/26/2016 PCP: Vicenta Aly, FNP  Brief Narrative: [name] is a 26 y.o. female with medical history significant of type 1 diabetes mellitus, admitted with hyperglycemia nausea vomiting abdominal pain. Patient has had multiple admissions for DKA. She reports that she has severe gastroparesis. In when her gastroparesis starts getting worse her blood sugar goes up. She reports taking her insulin .    Assessment & Plan:     DKA -resolved -resumed lantus, transitioned off Insulin gtt -CBGs up will increase lantus -advance diet to Carb modified    Leukocytosis  -reactive, resolved    Uncontrolled type 1 diabetes mellitus ([facility]) -last Hba1c was 7.7 2 months ago -resumed lantus and SSI -increased dose     Protein-calorie malnutrition, severe -supplements as tol    Severe Gastroparesis -continue reglan -advance diet today    GERD (gastroesophageal reflux disease) -PPI   DVT prophylaxis: lovenox Code Status: Full Code Family Communication: none at bedside Disposition Plan: home later today or in am     Subjective: Feels better, no further vomiting, pain and nausea persistent  Objective: Vitals:   09/28/16 0800 09/28/16 0900 09/28/16 1000 09/28/16 1100  BP: (!) 142/90 130/80 115/67 138/90  Pulse: (!) 30 (!) 108 (!) 102 (!) 114  Resp: (!) 21 13 17 15   Temp:      TempSrc:      SpO2: 93% 99% 98% 100%  Weight:      Height:        Intake/Output Summary (Last 24 hours) at 09/28/16 1145 Last data filed at 09/27/16 1300  Gross per 24 hour  Intake            337.5 ml  Output                0 ml  Net            337.5 ml   Filed Weights   09/27/16 0800  Weight: 54.5 kg (120 lb 2.4 oz)    Examination:  General exam: Appears calm and uncomfortable, chronically ill appearing Respiratory system: Clear to auscultation. Respiratory effort normal. Cardiovascular system: S1 & S2 heard,  RRR. No JVD, murmurs, rubs Gastrointestinal system: Abdomen is nondistended, soft and nontender. Normal bowel sounds heard. Central nervous system: Alert and oriented. No focal neurological deficits. Extremities: Symmetric 5 x 5 power. Skin: No rashes, lesions or ulcers Psychiatry: Judgement and insight appear normal. Mood & affect appropriate.     Data Reviewed:   CBC:  Recent Labs Lab 09/26/16 1059 09/27/16 0334  WBC 12.5* 8.9  NEUTROABS  --  6.6  HGB 12.9 10.9*  HCT 38.7 33.6*  MCV 86.4 87.7  PLT 488* 417*   Basic Metabolic Panel:  Recent Labs Lab 09/26/16 1059 09/26/16 1625 09/26/16 2315 09/27/16 0334 09/27/16 0700 09/28/16 0753  NA 135 140 139 139 139 133*  K 5.2* 4.7 4.2 3.7 3.8 3.9  CL 99* 112* 113* 113* 113* 103  CO2 18* 14* 19* 18* 20* 16*  GLUCOSE 347* 277* 126* 180* 149* 323*  BUN 14 12 13 13 12 9   CREATININE 0.80 0.63 0.62 0.59 0.55 0.61  CALCIUM 9.8 8.9 8.5* 8.5* 8.7* 8.5*  MG 2.0  --   --   --   --   --    GFR: Estimated Creatinine Clearance: 91.7 mL/min (by C-G formula based on SCr of 0.61 mg/dL). Liver Function Tests:  Recent Labs Lab 09/26/16 1059  09/27/16 0334  AST 47* 14*  ALT 17 10*  ALKPHOS 121 88  BILITOT 1.4* 0.6  PROT 8.9* 6.7  ALBUMIN 4.6 3.7    Recent Labs Lab 09/26/16 1059  LIPASE 14   No results for input(s): AMMONIA in the last 168 hours. Coagulation Profile: No results for input(s): INR, PROTIME in the last 168 hours. Cardiac Enzymes: No results for input(s): CKTOTAL, CKMB, CKMBINDEX, TROPONINI in the last 168 hours. BNP (last 3 results) No results for input(s): PROBNP in the last 8760 hours. HbA1C: No results for input(s): HGBA1C in the last 72 hours. CBG:  Recent Labs Lab 09/27/16 1025 09/27/16 1211 09/27/16 1756 09/27/16 2123 09/28/16 0733  GLUCAP 145* 274* 218* 197* 302*   Lipid Profile: No results for input(s): CHOL, HDL, LDLCALC, TRIG, CHOLHDL, LDLDIRECT in the last 72 hours. Thyroid Function  Tests: No results for input(s): TSH, T4TOTAL, FREET4, T3FREE, THYROIDAB in the last 72 hours. Anemia Panel: No results for input(s): VITAMINB12, FOLATE, FERRITIN, TIBC, IRON, RETICCTPCT in the last 72 hours. Urine analysis:    Component Value Date/Time   COLORURINE YELLOW 09/26/2016 1306   APPEARANCEUR CLOUDY (A) 09/26/2016 1306   LABSPEC 1.023 09/26/2016 1306   PHURINE 6.0 09/26/2016 1306   GLUCOSEU >=500 (A) 09/26/2016 1306   GLUCOSEU >=1000 11/07/2012 1205   HGBUR SMALL (A) 09/26/2016 1306   BILIRUBINUR NEGATIVE 09/26/2016 1306   KETONESUR 80 (A) 09/26/2016 1306   PROTEINUR 30 (A) 09/26/2016 1306   UROBILINOGEN 0.2 11/24/2014 1045   NITRITE NEGATIVE 09/26/2016 1306   LEUKOCYTESUR LARGE (A) 09/26/2016 1306   Sepsis Labs: @LABRCNTIP (procalcitonin:4,lacticidven:4)  ) Recent Results (from the past 240 hour(s))  MRSA PCR Screening     Status: None   Collection Time: 09/26/16  4:11 PM  Result Value Ref Range Status   MRSA by PCR NEGATIVE NEGATIVE Final    Comment:        The GeneXpert MRSA Assay (FDA approved for NASAL specimens only), is one component of a comprehensive MRSA colonization surveillance program. It is not intended to diagnose MRSA infection nor to guide or monitor treatment for MRSA infections.          Radiology Studies: No results found.      Scheduled Meds: . enoxaparin (LOVENOX) injection  40 mg Subcutaneous Q24H  . insulin aspart  0-9 Units Subcutaneous TID WC  . insulin glargine  35 Units Subcutaneous Daily  . metoCLOPramide (REGLAN) injection  10 mg Intravenous TID AC   Continuous Infusions:    LOS: 2 days    Time spent: 46min    Domenic Polite, MD [facility] Pager [phone]  If 7PM-7AM, please contact night-coverage [email or web address] Password [email or web address] 09/28/2016, 11:45 AM

## 2016-09-29 DIAGNOSIS — K219 Gastro-esophageal reflux disease without esophagitis: Secondary | ICD-10-CM

## 2016-09-29 LAB — BASIC METABOLIC PANEL
Anion gap: 10 (ref 5–15)
BUN: 7 mg/dL (ref 6–20)
CALCIUM: 8.8 mg/dL — AB (ref 8.9–10.3)
CO2: 22 mmol/L (ref 22–32)
Chloride: 101 mmol/L (ref 101–111)
Creatinine, Ser: 0.44 mg/dL (ref 0.44–1.00)
GFR calc Af Amer: >60 mL/min (ref >60)
GLUCOSE: 234 mg/dL — AB (ref 65–99)
POTASSIUM: 3.5 mmol/L (ref 3.5–5.1)
Sodium: 133 mmol/L — ABNORMAL LOW (ref 135–145)

## 2016-09-29 LAB — CBC
HCT: 34.1 % — ABNORMAL LOW (ref 36.0–46.0)
Hemoglobin: 11.3 g/dL — ABNORMAL LOW (ref 12.0–15.0)
MCH: 28 pg (ref 26.0–34.0)
MCHC: 33.1 g/dL (ref 30.0–36.0)
MCV: 84.6 fL (ref 78.0–100.0)
PLATELETS: 370 10*3/uL (ref 150–400)
RBC: 4.03 MIL/uL (ref 3.87–5.11)
RDW: 13.4 % (ref 11.5–15.5)
WBC: 5.1 10*3/uL (ref 4.0–10.5)

## 2016-09-29 LAB — GLUCOSE, CAPILLARY
Glucose-Capillary: 214 mg/dL — ABNORMAL HIGH (ref 65–99)
Glucose-Capillary: 277 mg/dL — ABNORMAL HIGH (ref 65–99)

## 2016-09-29 MED ORDER — INSULIN GLARGINE 100 UNIT/ML ~~LOC~~ SOLN: 25.0000 [IU] | Freq: Every day | SUBCUTANEOUS | Status: DC

## 2016-09-29 NOTE — Progress Notes (Signed)
Prior to discharge, discussed patient insulin regimen with Dr. Broadus John. DM Coordinator called Dr. Shirlyn Goltz office and received appt time of Thursday September 13th @ 1 p.m. Patient states she is unable to make this time for the appt but agrees to call Dr. Shirlyn Goltz office to reschedule appt. Dr. Hartford Poli returned call and prefers patient to continue on prehospital dose of insulin. Patient to followup with Dr. Shirlyn Goltz office for insulin dosages and appointment for followup.  Thank you, Nani Gasser. Yulia Ulrich, RN, MSN, CDE  Diabetes Coordinator Inpatient Glycemic Control Team Team Pager [phone] (8am-5pm) 09/29/2016 1:53 PM

## 2016-09-29 NOTE — Discharge Summary (Signed)
Physician Discharge Summary  [name] ZOX:[id number] DOB: 1990/05/02 DOA: 09/26/2016  PCP: Vicenta Aly, FNP  Admit date: 09/26/2016 Discharge date: 09/29/2016  Time spent: 35 minutes  Recommendations for Outpatient Follow-up:  1. Dr.Levy on thursday 9/13 for further titration of Lantus   Discharge Diagnoses:    DKA   Uncontrolled type 1 diabetes mellitus ([place])   Protein-calorie malnutrition, severe   Severe Gastroparesis   GERD (gastroesophageal reflux disease)   DKA, type 1 ([place])   Discharge Condition: improved  Diet recommendation: diabetic  Filed Weights   09/27/16 0800  Weight: 54.5 kg (120 lb 2.4 oz)    History of present illness:  Stefanie Moreheadis a 26 y.o.femalewith medical history significant of type 1 diabetes mellitus, admitted with hyperglycemia nausea vomiting abdominal pain. Patient has had multiple admissions for DKA. She reports that she has severe gastroparesis. In when her gastroparesis starts getting worse her blood sugar goes up. She reports taking her insulin   Hospital Course:    DKA -resolved -resumed lantus, transitioned off Insulin gtt -required 35units of Lantus this am, but basal dose is 10-12 units which was recommended by her Endocrinologist and hence we discharged her on 25units and DM coordinator called Dr.Levy's office to schedule a quick follow up-likely on Thursday -advance diet to Carb modified    Leukocytosis  -reactive, resolved    Uncontrolled type 1 diabetes mellitus ([place]) -last Hba1c was 7.7, 2 months ago -see discussion above    Protein-calorie malnutrition, severe -supplements advised    Severe Gastroparesis -continue reglan -advanced diet  -tolerating diet today and anxious to go home    GERD (gastroesophageal reflux disease) -PPI   Discharge Exam: Vitals:   09/29/16 0456 09/29/16 1020  BP: 124/83 125/90  Pulse: 95 99  Resp: 16 18  Temp: 98.1 F (36.7 C)   SpO2: 99% 100%    General:  AAOx3 Cardiovascular: S1S2/RRR Respiratory: CTAB  Discharge Instructions   Discharge Instructions    Diet Carb Modified    Complete by:  As directed    Increase activity slowly    Complete by:  As directed      Current Discharge Medication List    CONTINUE these medications which have CHANGED   Details  insulin glargine (LANTUS) 100 UNIT/ML injection Inject 0.25 mLs (25 Units total) into the skin daily.      CONTINUE these medications which have NOT CHANGED   Details  dicyclomine (BENTYL) 20 MG tablet Take 1 tablet (20 mg total) by mouth 2 (two) times daily. Qty: 20 tablet, Refills: 0    erythromycin (E-MYCIN) 250 MG tablet Take 1 tablet (250 mg total) by mouth 3 (three) times daily with meals. Qty: 90 tablet, Refills: 0    insulin aspart (NOVOLOG FLEXPEN) 100 UNIT/ML FlexPen Inject 1-10 Units into the skin 3 (three) times daily with meals. Pt uses 1 unit for every 10 grams of carbs with meals and snacks for BS greater than 50.    meloxicam (MOBIC) 7.5 MG tablet Take 1 tablet (7.5 mg total) by mouth daily. Qty: 10 tablet, Refills: 0    metoCLOPramide (REGLAN) 10 MG tablet Take 1 tablet (10 mg total) by mouth every 6 (six) hours as needed for nausea. Qty: 12 tablet, Refills: 0    metoprolol tartrate (LOPRESSOR) 25 MG tablet Take 0.5 tablets (12.5 mg total) by mouth 2 (two) times daily. Qty: 60 tablet, Refills: 0    ondansetron (ZOFRAN) 4 MG tablet Take 1 tablet (4 mg total) by mouth every  6 (six) hours. Qty: 12 tablet, Refills: 0    RESTASIS MULTIDOSE 0.05 % ophthalmic emulsion Place 1 drop into both eyes every 12 (twelve) hours. Refills: 12    Vitamin D, Ergocalciferol, (DRISDOL) 50000 units CAPS capsule Take 50,000 Units by mouth once a week. On fridays Refills: 5    glucagon (GLUCAGON EMERGENCY) 1 MG injection Inject 1 mg into the vein once as needed. Qty: 1 each, Refills: 12       Allergies  Allergen Reactions  . Peanut-Containing Drug Products Swelling and  Other (See Comments)    Reaction:  Swelling of mouth and lips   . 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   Reaction:  Swelling of mouth and lips   . Ultram [Tramadol] Itching   Follow-up Information    Vicenta Aly, FNP. Schedule an appointment as soon as possible for a visit in 1 week(s).   Specialty:  Nurse Practitioner Contact information: [address] [place] [address] [phone]            The results of significant diagnostics from this hospitalization (including imaging, microbiology, ancillary and laboratory) are listed below for reference.    Significant Diagnostic Studies: No results found.  Microbiology: Recent Results (from the past 240 hour(s))  MRSA PCR Screening     Status: None   Collection Time: 09/26/16  4:11 PM  Result Value Ref Range Status   MRSA by PCR NEGATIVE NEGATIVE Final    Comment:        The GeneXpert MRSA Assay (FDA approved for NASAL specimens only), is one component of a comprehensive MRSA colonization surveillance program. It is not intended to diagnose MRSA infection nor to guide or monitor treatment for MRSA infections.      Labs: Basic Metabolic Panel:  Recent Labs Lab 09/26/16 1059  09/26/16 2315 09/27/16 0334 09/27/16 0700 09/28/16 0753 09/29/16 0533  NA 135  < > 139 139 139 133* 133*  K 5.2*  < > 4.2 3.7 3.8 3.9 3.5  CL 99*  < > 113* 113* 113* 103 101  CO2 18*  < > 19* 18* 20* 16* 22  GLUCOSE 347*  < > 126* 180* 149* 323* 234*  BUN 14  < > 13 13 12 9 7   CREATININE 0.80  < > 0.62 0.59 0.55 0.61 0.44  CALCIUM 9.8  < > 8.5* 8.5* 8.7* 8.5* 8.8*  MG 2.0  --   --   --   --   --   --   < > = values in this interval not displayed. Liver Function Tests:  Recent Labs Lab 09/26/16 1059 09/27/16 0334  AST 47* 14*  ALT 17 10*  ALKPHOS 121 88  BILITOT 1.4* 0.6  PROT 8.9* 6.7  ALBUMIN 4.6 3.7    Recent Labs Lab 09/26/16 1059  LIPASE 14   No results  for input(s): AMMONIA in the last 168 hours. CBC:  Recent Labs Lab 09/26/16 1059 09/27/16 0334 09/29/16 0533  WBC 12.5* 8.9 5.1  NEUTROABS  --  6.6  --   HGB 12.9 10.9* 11.3*  HCT 38.7 33.6* 34.1*  MCV 86.4 87.7 84.6  PLT 488* 403* 370   Cardiac Enzymes: No results for input(s): CKTOTAL, CKMB, CKMBINDEX, TROPONINI in the last 168 hours. BNP: BNP (last 3 results) No results for input(s): BNP in the last 8760 hours.  ProBNP (last 3 results) No results for input(s): PROBNP in the  last 8760 hours.  CBG:  Recent Labs Lab 09/28/16 1144 09/28/16 1227 09/28/16 1715 09/28/16 2129 09/29/16 0731  GLUCAP 134* 145* 94 207* 214*       SignedDomenic Polite MD.  [facility] 09/29/2016, 11:44 AM

## 2016-09-29 NOTE — Progress Notes (Signed)
Instructions reviewed with patient. Pt verbalizes understanding instructions. Taken to vehicle via wheelchair.

## 2016-10-23 ENCOUNTER — Observation Stay (HOSPITAL_COMMUNITY): Payer: Medicaid Other

## 2016-10-23 ENCOUNTER — Encounter (HOSPITAL_COMMUNITY): Payer: Self-pay

## 2016-10-23 ENCOUNTER — Inpatient Hospital Stay (HOSPITAL_COMMUNITY)
Admission: EM | Admit: 2016-10-23 | Discharge: 2016-10-29 | DRG: 074 | Disposition: A | Discharge status: Home / Self Care | Payer: Medicaid Other | Attending: Internal Medicine | Admitting: Internal Medicine

## 2016-10-23 DIAGNOSIS — E1043 Type 1 diabetes mellitus with diabetic autonomic (poly)neuropathy: Principal | ICD-10-CM | POA: Diagnosis present

## 2016-10-23 DIAGNOSIS — K3184 Gastroparesis: Secondary | ICD-10-CM | POA: Diagnosis present

## 2016-10-23 DIAGNOSIS — Z79899 Other long term (current) drug therapy: Secondary | ICD-10-CM

## 2016-10-23 DIAGNOSIS — IMO0002 Reserved for concepts with insufficient information to code with codable children: Secondary | ICD-10-CM | POA: Diagnosis present

## 2016-10-23 DIAGNOSIS — I10 Essential (primary) hypertension: Secondary | ICD-10-CM | POA: Diagnosis present

## 2016-10-23 DIAGNOSIS — E860 Dehydration: Secondary | ICD-10-CM | POA: Diagnosis present

## 2016-10-23 DIAGNOSIS — B373 Candidiasis of vulva and vagina: Secondary | ICD-10-CM | POA: Diagnosis present

## 2016-10-23 DIAGNOSIS — F418 Other specified anxiety disorders: Secondary | ICD-10-CM | POA: Diagnosis present

## 2016-10-23 DIAGNOSIS — N390 Urinary tract infection, site not specified: Secondary | ICD-10-CM | POA: Diagnosis not present

## 2016-10-23 DIAGNOSIS — K219 Gastro-esophageal reflux disease without esophagitis: Secondary | ICD-10-CM | POA: Diagnosis present

## 2016-10-23 DIAGNOSIS — R109 Unspecified abdominal pain: Secondary | ICD-10-CM | POA: Diagnosis present

## 2016-10-23 DIAGNOSIS — R112 Nausea with vomiting, unspecified: Secondary | ICD-10-CM | POA: Diagnosis not present

## 2016-10-23 DIAGNOSIS — F12188 Cannabis abuse with other cannabis-induced disorder: Secondary | ICD-10-CM | POA: Diagnosis present

## 2016-10-23 DIAGNOSIS — J45909 Unspecified asthma, uncomplicated: Secondary | ICD-10-CM | POA: Diagnosis present

## 2016-10-23 DIAGNOSIS — D72829 Elevated white blood cell count, unspecified: Secondary | ICD-10-CM

## 2016-10-23 DIAGNOSIS — E1010 Type 1 diabetes mellitus with ketoacidosis without coma: Secondary | ICD-10-CM | POA: Diagnosis present

## 2016-10-23 DIAGNOSIS — E1065 Type 1 diabetes mellitus with hyperglycemia: Secondary | ICD-10-CM | POA: Diagnosis present

## 2016-10-23 DIAGNOSIS — K760 Fatty (change of) liver, not elsewhere classified: Secondary | ICD-10-CM | POA: Diagnosis present

## 2016-10-23 LAB — COMPREHENSIVE METABOLIC PANEL
ALBUMIN: 4.2 g/dL (ref 3.5–5.0)
ALK PHOS: 126 U/L (ref 38–126)
ALT: 13 U/L — AB (ref 14–54)
ANION GAP: 15 (ref 5–15)
AST: 24 U/L (ref 15–41)
BUN: 17 mg/dL (ref 6–20)
CALCIUM: 9.2 mg/dL (ref 8.9–10.3)
CO2: 21 mmol/L — AB (ref 22–32)
CREATININE: 0.74 mg/dL (ref 0.44–1.00)
Chloride: 102 mmol/L (ref 101–111)
GFR calc Af Amer: >60 mL/min (ref >60)
GFR calc non Af Amer: >60 mL/min (ref >60)
GLUCOSE: 439 mg/dL — AB (ref 65–99)
Potassium: 3.9 mmol/L (ref 3.5–5.1)
SODIUM: 138 mmol/L (ref 135–145)
Total Bilirubin: 0.9 mg/dL (ref 0.3–1.2)
Total Protein: 7.9 g/dL (ref 6.5–8.1)

## 2016-10-23 LAB — URINALYSIS, ROUTINE W REFLEX MICROSCOPIC
BILIRUBIN URINE: NEGATIVE
Glucose, UA: >=500 mg/dL — AB
Hgb urine dipstick: NEGATIVE
Ketones, ur: 80 mg/dL — AB
Nitrite: NEGATIVE
Protein, ur: NEGATIVE mg/dL
Specific Gravity, Urine: 1.017 (ref 1.005–1.030)
pH: 6 (ref 5.0–8.0)

## 2016-10-23 LAB — GLUCOSE, CAPILLARY
GLUCOSE-CAPILLARY: 220 mg/dL — AB (ref 65–99)
GLUCOSE-CAPILLARY: 323 mg/dL — AB (ref 65–99)
Glucose-Capillary: 198 mg/dL — ABNORMAL HIGH (ref 65–99)

## 2016-10-23 LAB — RAPID URINE DRUG SCREEN, HOSP PERFORMED
Amphetamines: NOT DETECTED
BENZODIAZEPINES: NOT DETECTED
Barbiturates: NOT DETECTED
Cocaine: NOT DETECTED
Opiates: POSITIVE — AB
TETRAHYDROCANNABINOL: POSITIVE — AB

## 2016-10-23 LAB — CBC
HEMATOCRIT: 37.3 % (ref 36.0–46.0)
HEMOGLOBIN: 12.4 g/dL (ref 12.0–15.0)
MCH: 28.5 pg (ref 26.0–34.0)
MCHC: 33.2 g/dL (ref 30.0–36.0)
MCV: 85.7 fL (ref 78.0–100.0)
Platelets: 425 10*3/uL — ABNORMAL HIGH (ref 150–400)
RBC: 4.35 MIL/uL (ref 3.87–5.11)
RDW: 14.3 % (ref 11.5–15.5)
WBC: 12.2 10*3/uL — ABNORMAL HIGH (ref 4.0–10.5)

## 2016-10-23 LAB — CBG MONITORING, ED: Glucose-Capillary: 326 mg/dL — ABNORMAL HIGH (ref 65–99)

## 2016-10-23 LAB — I-STAT BETA HCG BLOOD, ED (MC, WL, AP ONLY)

## 2016-10-23 LAB — LIPASE, BLOOD: Lipase: 17 U/L (ref 11–51)

## 2016-10-23 LAB — I-STAT CG4 LACTIC ACID, ED: Lactic Acid, Venous: 1.72 mmol/L (ref 0.5–1.9)

## 2016-10-23 MED ORDER — SODIUM CHLORIDE 0.9% FLUSH
3.0000 mL | Freq: Two times a day (BID) | INTRAVENOUS | Status: DC
  Administered 2016-10-23 – 2016-10-29 (×5): 3 mL via INTRAVENOUS

## 2016-10-23 MED ORDER — SODIUM CHLORIDE 0.9 % IV BOLUS (SEPSIS)
1000.0000 mL | Freq: Once | INTRAVENOUS | Status: AC
  Administered 2016-10-23: 1000 mL via INTRAVENOUS

## 2016-10-23 MED ORDER — INSULIN ASPART 100 UNIT/ML ~~LOC~~ SOLN
10.0000 [IU] | Freq: Once | SUBCUTANEOUS | Status: AC
Start: 2016-10-23 — End: 2016-10-23
  Administered 2016-10-23: 10 [IU] via SUBCUTANEOUS
  Filled 2016-10-23: qty 1

## 2016-10-23 MED ORDER — HYDRALAZINE HCL 20 MG/ML IJ SOLN
10.0000 mg | Freq: Three times a day (TID) | INTRAMUSCULAR | Status: DC | PRN
  Administered 2016-10-24 – 2016-10-28 (×4): 10 mg via INTRAVENOUS
  Filled 2016-10-23 (×4): qty 1

## 2016-10-23 MED ORDER — HALOPERIDOL LACTATE 5 MG/ML IJ SOLN
2.0000 mg | Freq: Once | INTRAMUSCULAR | Status: AC
  Administered 2016-10-23: 2 mg via INTRAVENOUS
  Filled 2016-10-23: qty 1

## 2016-10-23 MED ORDER — IOPAMIDOL (ISOVUE-300) INJECTION 61%: 30.0000 mL | Freq: Once | INTRAVENOUS | Status: DC | PRN

## 2016-10-23 MED ORDER — MORPHINE SULFATE (PF) 4 MG/ML IV SOLN
4.0000 mg | Freq: Once | INTRAVENOUS | Status: AC
  Administered 2016-10-23: 4 mg via INTRAVENOUS
  Filled 2016-10-23: qty 1

## 2016-10-23 MED ORDER — INSULIN ASPART 100 UNIT/ML ~~LOC~~ SOLN
0.0000 [IU] | Freq: Three times a day (TID) | SUBCUTANEOUS | Status: DC
  Administered 2016-10-23: 11 [IU] via SUBCUTANEOUS
  Administered 2016-10-24 (×2): 3 [IU] via SUBCUTANEOUS
  Administered 2016-10-25: 5 [IU] via SUBCUTANEOUS

## 2016-10-23 MED ORDER — METOCLOPRAMIDE HCL 5 MG/ML IJ SOLN
10.0000 mg | Freq: Once | INTRAMUSCULAR | Status: AC
  Administered 2016-10-23: 10 mg via INTRAVENOUS
  Filled 2016-10-23: qty 2

## 2016-10-23 MED ORDER — ACETAMINOPHEN 325 MG PO TABS
650.0000 mg | ORAL_TABLET | Freq: Four times a day (QID) | ORAL | Status: DC | PRN
  Administered 2016-10-23 – 2016-10-24 (×4): 650 mg via ORAL
  Filled 2016-10-23 (×4): qty 2

## 2016-10-23 MED ORDER — IOPAMIDOL (ISOVUE-300) INJECTION 61%
INTRAVENOUS | Status: AC
  Administered 2016-10-23: 100 mL
  Filled 2016-10-23: qty 100

## 2016-10-23 MED ORDER — MORPHINE SULFATE (PF) 4 MG/ML IV SOLN
2.0000 mg | Freq: Once | INTRAVENOUS | Status: AC
  Administered 2016-10-23: 2 mg via INTRAVENOUS
  Filled 2016-10-23: qty 1

## 2016-10-23 MED ORDER — PROCHLORPERAZINE EDISYLATE 5 MG/ML IJ SOLN
10.0000 mg | Freq: Once | INTRAMUSCULAR | Status: AC
  Administered 2016-10-23: 10 mg via INTRAVENOUS
  Filled 2016-10-23: qty 2

## 2016-10-23 MED ORDER — INSULIN GLARGINE 100 UNIT/ML ~~LOC~~ SOLN
25.0000 [IU] | Freq: Every day | SUBCUTANEOUS | Status: DC
  Administered 2016-10-23 – 2016-10-24 (×2): 25 [IU] via SUBCUTANEOUS
  Filled 2016-10-23 (×2): qty 0.25

## 2016-10-23 MED ORDER — DIPHENHYDRAMINE HCL 50 MG/ML IJ SOLN
12.5000 mg | Freq: Once | INTRAMUSCULAR | Status: AC
  Administered 2016-10-23: 12.5 mg via INTRAVENOUS
  Filled 2016-10-23: qty 1

## 2016-10-23 MED ORDER — IOPAMIDOL (ISOVUE-300) INJECTION 61%
INTRAVENOUS | Status: AC
  Filled 2016-10-23: qty 30

## 2016-10-23 MED ORDER — KETOROLAC TROMETHAMINE 30 MG/ML IJ SOLN
30.0000 mg | Freq: Once | INTRAMUSCULAR | Status: AC
  Administered 2016-10-23: 30 mg via INTRAVENOUS
  Filled 2016-10-23: qty 1

## 2016-10-23 MED ORDER — DIPHENHYDRAMINE HCL 50 MG/ML IJ SOLN
25.0000 mg | Freq: Once | INTRAMUSCULAR | Status: AC
  Administered 2016-10-23: 25 mg via INTRAVENOUS
  Filled 2016-10-23: qty 1

## 2016-10-23 MED ORDER — ALBUTEROL SULFATE (2.5 MG/3ML) 0.083% IN NEBU: 3.0000 mL | INHALATION_SOLUTION | Freq: Four times a day (QID) | RESPIRATORY_TRACT | Status: DC | PRN

## 2016-10-23 MED ORDER — METOCLOPRAMIDE HCL 5 MG/ML IJ SOLN
5.0000 mg | Freq: Three times a day (TID) | INTRAMUSCULAR | Status: DC | PRN
  Administered 2016-10-23 – 2016-10-25 (×4): 5 mg via INTRAVENOUS
  Filled 2016-10-23 (×4): qty 2

## 2016-10-23 MED ORDER — CYCLOSPORINE 0.05 % OP EMUL
1.0000 [drp] | Freq: Two times a day (BID) | OPHTHALMIC | Status: DC
  Administered 2016-10-23 – 2016-10-29 (×6): 1 [drp] via OPHTHALMIC
  Filled 2016-10-23 (×13): qty 1

## 2016-10-23 MED ORDER — ACETAMINOPHEN 650 MG RE SUPP: 650.0000 mg | Freq: Four times a day (QID) | RECTAL | Status: DC | PRN

## 2016-10-23 MED ORDER — DEXTROSE 5 % IV SOLN
1.0000 g | INTRAVENOUS | Status: DC
  Administered 2016-10-23 – 2016-10-26 (×4): 1 g via INTRAVENOUS
  Filled 2016-10-23 (×6): qty 10

## 2016-10-23 MED ORDER — INFLUENZA VAC SPLIT QUAD 0.5 ML IM SUSY: 0.5000 mL | PREFILLED_SYRINGE | INTRAMUSCULAR | Status: DC | PRN

## 2016-10-23 MED ORDER — SODIUM CHLORIDE 0.9 % IV SOLN
INTRAVENOUS | Status: AC
  Administered 2016-10-23: 17:00:00 via INTRAVENOUS

## 2016-10-23 MED ORDER — HEPARIN SODIUM (PORCINE) 5000 UNIT/ML IJ SOLN
5000.0000 [IU] | Freq: Three times a day (TID) | INTRAMUSCULAR | Status: DC
  Administered 2016-10-23 – 2016-10-29 (×14): 5000 [IU] via SUBCUTANEOUS
  Filled 2016-10-23 (×16): qty 1

## 2016-10-23 MED ORDER — PROMETHAZINE HCL 25 MG/ML IJ SOLN
12.5000 mg | Freq: Once | INTRAMUSCULAR | Status: AC
  Administered 2016-10-23: 12.5 mg via INTRAVENOUS
  Filled 2016-10-23: qty 1

## 2016-10-23 MED ORDER — MORPHINE SULFATE (PF) 2 MG/ML IV SOLN
2.0000 mg | Freq: Once | INTRAVENOUS | Status: AC
  Administered 2016-10-23: 2 mg via INTRAVENOUS
  Filled 2016-10-23: qty 1

## 2016-10-23 NOTE — ED Triage Notes (Addendum)
Pt presents with vomiting and abdominal pain. Family states her daughter has had a stomach virus from school but this is more severe. States that vomiting started yesterday afternoon. Pt unable to sit still to answer questions or complete vitals. Pt has no complaints of diarrhea or constipation, lmp was last month. Family states she has a history of gastroparesis and normally presents this way.

## 2016-10-23 NOTE — ED Notes (Signed)
Hospitalist at bedside 

## 2016-10-23 NOTE — ED Notes (Signed)
ED TO INPATIENT HANDOFF REPORT  Name/Age/Gender [name] 26 y.o. female  Code Status    Code Status Orders        Start     Ordered   10/23/16 1341  Full code  Continuous     10/23/16 1342    Code Status History    Date Active Date Inactive Code Status Order ID Comments User Context   09/26/2016  3:03 PM 09/29/2016  3:33 PM Full Code [id number]  Georgette Shell, MD ED   07/15/2016  5:12 PM 07/18/2016  8:35 PM Full Code [id number]  Damita Lack, MD Inpatient   07/05/2016  4:58 PM 07/11/2016  4:20 PM Full Code [id number]  Bonnielee Haff, MD Inpatient   06/23/2016  8:56 AM 06/28/2016  1:46 PM Full Code [id number]  Janece Canterbury, MD Inpatient   06/12/2016  9:37 PM 06/14/2016  5:23 PM Full Code [id number]  Sid Falcon, MD Inpatient   06/03/2016  8:44 PM 06/06/2016  8:41 PM Full Code [id number]  Gardiner Barefoot, NP Inpatient   05/31/2016 11:48 PM 06/03/2016  8:44 PM Full Code [id number]  Rise Patience, MD Inpatient   05/14/2016  2:12 AM 05/16/2016  5:18 PM Full Code [id number]  Toy Baker, MD Inpatient   05/04/2016  9:40 PM 05/10/2016  8:04 PM Full Code [id number]  Orson Eva, MD Inpatient   02/29/2016 10:01 PM 03/04/2016  4:46 PM Full Code [id number]  Toy Baker, MD Inpatient   02/20/2016  2:41 PM 02/25/2016  5:23 PM Full Code [id number]  Elgergawy, Silver Huguenin, MD Inpatient   01/22/2016 10:21 AM 01/25/2016  4:07 PM Full Code [id number]  Orson Eva, MD Inpatient   01/17/2016  6:34 AM 01/19/2016  7:44 PM Full Code [id number]  Etta Quill, DO ED   11/18/2015 10:38 PM 11/23/2015 12:31 PM Full Code [id number]  Bonnielee Haff, MD Inpatient   11/09/2015  8:48 PM 11/12/2015  3:35 PM Full Code [id number]  Toy Baker, MD Inpatient   11/09/2015  8:48 PM 11/09/2015  8:48 PM Full Code [id number]  Toy Baker, MD Inpatient   11/03/2015  2:38 AM 11/06/2015  1:01 PM Full Code [id number]  Jani Gravel, MD Inpatient   11/02/2015  1:28 AM 11/03/2015  2:38 AM Full Code  [id number]  Phillips Grout, MD Inpatient   07/10/2015  4:45 PM 07/13/2015  2:31 PM Full Code [id number]  Ivor Costa, MD ED   07/01/2015 10:03 PM 07/06/2015  3:22 PM Full Code [id number]  Gennaro Africa, MD ED   06/22/2015  6:24 AM 06/24/2015  3:31 PM Full Code [id number]  Etta Quill, DO Inpatient   06/10/2015  1:56 AM 06/11/2015  3:19 PM Full Code [id number]  Reubin Milan, MD Inpatient   05/28/2015  4:09 PM 06/03/2015  7:32 PM Full Code [id number]  Barton Dubois, MD Inpatient   05/24/2015 11:59 PM 05/25/2015  6:58 PM Full Code [id number]  Theressa Millard, MD Inpatient   04/21/2015  9:31 AM 04/23/2015  2:45 PM Full Code [id number]  Gennaro Africa, MD ED   04/08/2015 10:29 PM 04/15/2015  4:05 PM Full Code [id number]  Reubin Milan, MD ED   03/22/2015  2:43 AM 03/25/2015  6:11 PM Full Code [id number]  Rise Patience, MD ED   02/11/2015  3:30 PM 02/12/2015  6:05 PM Full Code [id number]  Greer Pickerel, MD Inpatient   11/24/2014  1:09 PM 11/29/2014  7:29 PM Full Code [id number]  Rama, Venetia Maxon, MD Inpatient  09/16/2014  5:30 AM 09/21/2014  8:07 PM Full Code [id number]  Etta Quill, DO ED   06/29/2012  2:00 PM 07/01/2012  3:17 PM Full Code [id number]  Velvet Bathe, MD Inpatient   12/08/2011  3:43 PM 12/12/2011  2:50 PM Full Code [id number]  Chyrel Masson, RN Inpatient      Home/SNF/Other Going upstairs to 4W  Chief Complaint abdominal pain; emesis  Level of Care/Admitting Diagnosis ED Disposition    ED Disposition Condition Comment   [place] [facility]: [facility] [[id number]]  Level of Care: Telemetry [5]  Admit to tele based on following criteria: Monitor QTC interval  Diagnosis: N&V (nausea and vomiting) [902409]  Admitting Physician: Elwin Mocha [[id number]]  Attending Physician: Aggie Moats, Layne Benton [[id number]]  PT Class (Do Not Modify): Observation [104]  PT Acc Code (Do Not Modify): Observation [10022]       Medical History Past Medical History:  Diagnosis Date  . Anxiety    . Arthritis   . Asthma   . Diabetes mellitus 2007   IDDM.  poorly controlled, multiple admits with DKA  . Gallstones   . Gastroparesis   . Heart murmur   . Hepatic steatosis 11/26/2014   and hepatomegaly  . Hypertension    NOT CURRENTLY ON ANY BP MED  . Liver mass 11/26/2014  . Pancreatitis, acute 11/26/2014    Allergies Allergies  Allergen Reactions  . Peanut-Containing Drug Products Swelling and Other (See Comments)    Reaction:  Swelling of mouth and lips   . 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   Reaction:  Swelling of mouth and lips   . Ultram [Tramadol] Itching    IV Location/Drains/Wounds Patient Lines/Drains/Airways Status   Active Line/Drains/Airways    Name:   Placement date:   Placement time:   Site:   Days:   Peripheral IV 10/23/16 Left Antecubital  10/23/16    0806    Antecubital    less than 1          Labs/Imaging Results for orders placed or performed during the hospital encounter of 10/23/16 (from the past 48 hour(s))  CBC     Status: Abnormal   Collection Time: 10/23/16  8:02 AM  Result Value Ref Range   WBC 12.2 (H) 4.0 - 10.5 K/uL   RBC 4.35 3.87 - 5.11 MIL/uL   Hemoglobin 12.4 12.0 - 15.0 g/dL   HCT 37.3 36.0 - 46.0 %   MCV 85.7 78.0 - 100.0 fL   MCH 28.5 26.0 - 34.0 pg   MCHC 33.2 30.0 - 36.0 g/dL   RDW 14.3 11.5 - 15.5 %   Platelets 425 (H) 150 - 400 K/uL  Comprehensive metabolic panel     Status: Abnormal   Collection Time: 10/23/16  8:02 AM  Result Value Ref Range   Sodium 138 135 - 145 mmol/L   Potassium 3.9 3.5 - 5.1 mmol/L   Chloride 102 101 - 111 mmol/L   CO2 21 (L) 22 - 32 mmol/L   Glucose, Bld 439 (H) 65 - 99 mg/dL   BUN 17 6 - 20 mg/dL   Creatinine, Ser 0.74 0.44 - 1.00 mg/dL   Calcium 9.2 8.9 - 10.3 mg/dL   Total Protein 7.9 6.5 - 8.1 g/dL   Albumin 4.2 3.5 - 5.0 g/dL   AST 24 15 - 41 U/L   ALT 13 (L) 14 - 54 U/L   Alkaline Phosphatase  126 38 - 126 U/L   Total Bilirubin 0.9 0.3 - 1.2  mg/dL   GFR calc non Af Amer >60 >60 mL/min   GFR calc Af Amer >60 >60 mL/min    Comment: (NOTE) The eGFR has been calculated using the CKD EPI equation. This calculation has not been validated in all clinical situations. eGFR's persistently <60 mL/min signify possible Chronic Kidney Disease.    Anion gap 15 5 - 15  Lipase, blood     Status: None   Collection Time: 10/23/16  8:02 AM  Result Value Ref Range   Lipase 17 11 - 51 U/L  I-Stat Beta hCG blood, ED ([facility], [facility], [facility] only)     Status: None   Collection Time: 10/23/16  8:14 AM  Result Value Ref Range   I-stat hCG, quantitative <5.0 <5 mIU/mL   Comment 3            Comment:   GEST. AGE      CONC.  (mIU/mL)   <=1 WEEK        5 - 50     2 WEEKS       50 - 500     3 WEEKS       100 - 10,000     4 WEEKS     1,000 - 30,000        FEMALE AND NON-PREGNANT FEMALE:     LESS THAN 5 mIU/mL   I-Stat CG4 Lactic Acid, ED     Status: None   Collection Time: 10/23/16  8:15 AM  Result Value Ref Range   Lactic Acid, Venous 1.72 0.5 - 1.9 mmol/L  POC CBG, ED     Status: Abnormal   Collection Time: 10/23/16 11:23 AM  Result Value Ref Range   Glucose-Capillary 326 (H) 65 - 99 mg/dL   Comment 1 Notify RN    Comment 2 Document in Chart   Urinalysis, Routine w reflex microscopic     Status: Abnormal   Collection Time: 10/23/16  1:27 PM  Result Value Ref Range   Color, Urine YELLOW YELLOW   APPearance CLOUDY (A) CLEAR   Specific Gravity, Urine 1.017 1.005 - 1.030   pH 6.0 5.0 - 8.0   Glucose, UA >=500 (A) NEGATIVE mg/dL   Hgb urine dipstick NEGATIVE NEGATIVE   Bilirubin Urine NEGATIVE NEGATIVE   Ketones, ur 80 (A) NEGATIVE mg/dL   Protein, ur NEGATIVE NEGATIVE mg/dL   Nitrite NEGATIVE NEGATIVE   Leukocytes, UA LARGE (A) NEGATIVE   RBC / HPF 0-5 0 - 5 RBC/hpf   WBC, UA TOO NUMEROUS TO COUNT 0 - 5 WBC/hpf   Bacteria, UA RARE (A) NONE SEEN   Squamous Epithelial / LPF 0-5 (A) NONE SEEN   Budding Yeast PRESENT   Urine rapid drug screen (hosp  performed)     Status: Abnormal   Collection Time: 10/23/16  1:30 PM  Result Value Ref Range   Opiates POSITIVE (A) NONE DETECTED   Cocaine NONE DETECTED NONE DETECTED   Benzodiazepines NONE DETECTED NONE DETECTED   Amphetamines NONE DETECTED NONE DETECTED   Tetrahydrocannabinol POSITIVE (A) NONE DETECTED   Barbiturates NONE DETECTED NONE DETECTED    Comment:        DRUG SCREEN FOR MEDICAL PURPOSES ONLY.  IF CONFIRMATION IS NEEDED FOR ANY PURPOSE, NOTIFY LAB WITHIN 5 DAYS.        LOWEST DETECTABLE LIMITS FOR URINE DRUG SCREEN Drug Class       Cutoff (ng/mL)  Amphetamine      1000 Barbiturate      200 Benzodiazepine   211 Tricyclics       941 Opiates          300 Cocaine          300 THC              50    Dg Abd Acute W/chest  Result Date: 10/23/2016 CLINICAL DATA:  Nausea and vomiting since this morning. History of gastroparesis. EXAM: DG ABDOMEN ACUTE W/ 1V CHEST COMPARISON:  07/23/2016 FINDINGS: Bowel gas pattern is within normal limits. Relatively gasless abdomen consistent with the history of vomiting. Surgical clips related to previous cholecystectomy. No free air. One-view chest is normal. IMPRESSION: No acute finding. Relatively gasless abdomen consistent with the clinical history of vomiting. Electronically Signed   By: Nelson Chimes M.D.   On: 10/23/2016 14:12    Pending Labs Unresulted Labs    Start     Ordered   10/24/16 7408  Basic metabolic panel  Tomorrow morning,   R     10/23/16 1342   10/24/16 0500  CBC  Tomorrow morning,   R     10/23/16 1342   10/23/16 1341  CBC  (heparin)  Once,   R    Comments:  Baseline for heparin therapy IF NOT ALREADY DRAWN.  Notify MD if PLT < 100 K.    10/23/16 1342   10/23/16 1341  Creatinine, serum  (heparin)  Once,   R    Comments:  Baseline for heparin therapy IF NOT ALREADY DRAWN.    10/23/16 1342      Vitals/Pain Today's Vitals   10/23/16 1331 10/23/16 1355 10/23/16 1509 10/23/16 1512  BP: (!) 143/93   (!) 153/94   Pulse: (!) 116   (!) 131  Resp: 20   14  Temp:    98.2 F (36.8 C)  TempSrc:      SpO2: 100%   100%  Weight:      Height:      PainSc:  0-No pain 0-No pain     Isolation Precautions No active isolations  Medications Medications  Influenza vac split quadrivalent PF (FLUARIX) injection 0.5 mL (not administered)  insulin aspart (novoLOG) injection 0-15 Units (not administered)  albuterol (PROVENTIL) (2.5 MG/3ML) 0.083% nebulizer solution 3 mL (not administered)  insulin glargine (LANTUS) injection 25 Units (not administered)  cycloSPORINE (RESTASIS) 0.05 % ophthalmic emulsion 1 drop (not administered)  hydrALAZINE (APRESOLINE) injection 10 mg (not administered)  heparin injection 5,000 Units (not administered)  sodium chloride flush (NS) 0.9 % injection 3 mL (not administered)  0.9 %  sodium chloride infusion (not administered)  acetaminophen (TYLENOL) tablet 650 mg (not administered)    Or  acetaminophen (TYLENOL) suppository 650 mg (not administered)  metoCLOPramide (REGLAN) injection 5 mg (not administered)  metoCLOPramide (REGLAN) injection 10 mg (10 mg Intravenous Given 10/23/16 0812)  diphenhydrAMINE (BENADRYL) injection 25 mg (25 mg Intravenous Given 10/23/16 0812)  haloperidol lactate (HALDOL) injection 2 mg (2 mg Intravenous Given 10/23/16 0812)  ketorolac (TORADOL) 30 MG/ML injection 30 mg (30 mg Intravenous Given 10/23/16 0812)  sodium chloride 0.9 % bolus 1,000 mL (0 mLs Intravenous Stopped 10/23/16 0937)  sodium chloride 0.9 % bolus 1,000 mL (0 mLs Intravenous Stopped 10/23/16 1137)  morphine 4 MG/ML injection 4 mg (4 mg Intravenous Given 10/23/16 1026)  insulin aspart (novoLOG) injection 10 Units (10 Units Subcutaneous Given 10/23/16 1030)  promethazine (PHENERGAN) injection 12.5 mg (  12.5 mg Intravenous Given 10/23/16 1026)  diphenhydrAMINE (BENADRYL) injection 12.5 mg (12.5 mg Intravenous Given 10/23/16 1026)  prochlorperazine (COMPAZINE) injection 10 mg (10 mg Intravenous  Given 10/23/16 1309)  diphenhydrAMINE (BENADRYL) injection 12.5 mg (12.5 mg Intravenous Given 10/23/16 1306)  morphine 2 MG/ML injection 2 mg (2 mg Intravenous Given 10/23/16 1313)    Mobility ambulatory

## 2016-10-23 NOTE — ED Notes (Signed)
Attempted IV, could not get IV or bloodwork

## 2016-10-23 NOTE — ED Notes (Signed)
Call Joellen Jersey, RN at [phone].  Report time at 1530.

## 2016-10-23 NOTE — ED Notes (Signed)
Pt is actively vomiting and am unable to get her vitals

## 2016-10-23 NOTE — H&P (Signed)
[facility] History and Physical  [name] DGU:[id number] DOB: 03-15-1990 DOA: 10/23/2016  Referring physician:  PCP: Vicenta Aly, FNP   Chief Complaint: N/V  HPI: [name] is a 26 y.o. female  with past medical history of anxiety, asthma, diabetes, gastroparesis and pancreatitis presents emergency room with chief complaint nausea vomiting. Patient states she had acute onset of nausea vomiting around midnight. This could 10 units hourly. Nonbloody and nonbilious. Patient has [place] Pulling contacts wears her daughter who was disease processes not as severe. Patient denies any diarrhea. No recent medication changes. Denies any drug use. No heavy alcohol use.  ED course: She was given 3 different antiemetics without effect. Hospitalists were consulted for admission.   Review of Systems:  As per HPI otherwise 10 point review of systems negative.    Past Medical History:  Diagnosis Date  . Anxiety   . Arthritis   . Asthma   . Diabetes mellitus 2007   IDDM.  poorly controlled, multiple admits with DKA  . Gallstones   . Gastroparesis   . Heart murmur   . Hepatic steatosis 11/26/2014   and hepatomegaly  . Hypertension    NOT CURRENTLY ON ANY BP MED  . Liver mass 11/26/2014  . Pancreatitis, acute 11/26/2014   Past Surgical History:  Procedure Laterality Date  . CHOLECYSTECTOMY N/A 02/11/2015   Procedure: LAPAROSCOPIC CHOLECYSTECTOMY WITH INTRAOPERATIVE CHOLANGIOGRAM;  Surgeon: Greer Pickerel, MD;  Location: [facility];  Service: General;  Laterality: N/A;  . ESOPHAGOGASTRODUODENOSCOPY (EGD) WITH PROPOFOL Left 09/20/2014   Procedure: ESOPHAGOGASTRODUODENOSCOPY (EGD) WITH PROPOFOL;  Surgeon: Arta Silence, MD;  Location[facility];  Service: Endoscopy;  Laterality: Left;  . WISDOM TOOTH EXTRACTION     Social History:  reports that she has never smoked. She has never used smokeless tobacco. She reports that she uses drugs, including Marijuana. She reports that she does  not drink alcohol.  Allergies  Allergen Reactions  . Peanut-Containing Drug Products Swelling and Other (See Comments)    Reaction:  Swelling of mouth and lips   . 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   Reaction:  Swelling of mouth and lips   . Ultram [Tramadol] Itching    Family History  Problem Relation Age of Onset  . Heart disease Maternal Grandmother   . Heart disease Maternal Grandfather   . Diabetes Mother   . Hyperlipidemia Mother   . Hypertension Father   . Heart disease Father   . Hypertension Paternal Grandmother   . Cancer Paternal Grandfather      Prior to Admission medications   Medication Sig Start Date End Date Taking? Authorizing Provider  albuterol (PROVENTIL HFA;VENTOLIN HFA) 108 (90 Base) MCG/ACT inhaler Inhale 1-2 puffs into the lungs every 6 (six) hours as needed for wheezing or shortness of breath.   Yes [provider]  dicyclomine (BENTYL) 20 MG tablet Take 1 tablet (20 mg total) by mouth 2 (two) times daily. 07/13/16  Yes Providence Lanius A, PA-C  insulin aspart (NOVOLOG FLEXPEN) 100 UNIT/ML FlexPen Inject 1-10 Units into the skin 3 (three) times daily with meals. Pt uses 1 unit for every 10 grams of carbs with meals and snacks for BS greater than 50.   Yes [provider]  insulin glargine (LANTUS) 100 UNIT/ML injection Inject 0.25 mLs (25 Units total) into the skin daily. 09/29/16  Yes Domenic Polite, MD  lisinopril (PRINIVIL,ZESTRIL) 2.5 MG tablet Take 2.5 mg by mouth daily. 10/08/16  Yes  [provider]  meloxicam (MOBIC) 7.5 MG tablet Take 1 tablet (7.5 mg total) by mouth daily. 07/18/16  Yes Patrecia Pour, MD  metoCLOPramide (REGLAN) 10 MG tablet Take 1 tablet (10 mg total) by mouth every 6 (six) hours as needed for nausea. 07/23/16  Yes Orlie Dakin, MD  metoprolol tartrate (LOPRESSOR) 25 MG tablet Take 0.5 tablets (12.5 mg total) by mouth 2 (two) times daily. 05/10/16  Yes Mariel Aloe,  MD  ondansetron (ZOFRAN) 4 MG tablet Take 1 tablet (4 mg total) by mouth every 6 (six) hours. Patient taking differently: Take 4 mg by mouth every 8 (eight) hours as needed for nausea.  07/13/16  Yes Providence Lanius A, PA-C  RESTASIS MULTIDOSE 0.05 % ophthalmic emulsion Place 1 drop into both eyes every 12 (twelve) hours. 09/08/16  Yes [provider]  Vitamin D, Ergocalciferol, (DRISDOL) 50000 units CAPS capsule Take 50,000 Units by mouth once a week. On fridays 05/26/16  Yes [provider]  glucagon (GLUCAGON EMERGENCY) 1 MG injection Inject 1 mg into the vein once as needed. Patient taking differently: Inject 1 mg into the vein once as needed. High BS 06/14/16   Raiford Noble [place], [place]   Physical Exam: Vitals:   10/23/16 1316 10/23/16 1331 10/23/16 1512 10/23/16 1613  BP: 122/74 (!) 143/93 (!) 153/94 (!) 150/73  Pulse: (!) 117 (!) 116 (!) 131 (!) 127  Resp:  20 14 17   Temp:   98.2 F (36.8 C) 98.4 F (36.9 C)  TempSrc:    Oral  SpO2: 100% 100% 100% 100%  Weight:    55.9 kg (123 lb 3.8 oz)  Height:    5\' 4"  (1.626 m)    Wt Readings from Last 3 Encounters:  10/23/16 55.9 kg (123 lb 3.8 oz)  09/27/16 54.5 kg (120 lb 2.4 oz)  07/18/16 48.7 kg (107 lb 5.8 oz)    General:  Appears calm and comfortable; A&Ox3 Eyes:  PERRL, EOMI, normal lids, iris ENT:  grossly normal hearing, lips & tongue Neck:  no LAD, masses or thyromegaly Cardiovascular:  RRR, no m/r/g. No LE edema.  Respiratory:  CTA bilaterally, no w/r/r. Normal respiratory effort. Abdomen:  soft, ND, diffuse tenderness without rigidity or rebound Skin:  no rash or induration seen on limited exam Musculoskeletal:  grossly normal tone BUE/BLE Psychiatric:  grossly normal mood and affect, speech fluent and appropriate Neurologic:  CN 2-12 grossly intact, moves all extremities in coordinated fashion.          Labs on Admission:  Basic Metabolic Panel:  Recent Labs Lab 10/23/16 0802  NA 138  K 3.9  CL  102  CO2 21*  GLUCOSE 439*  BUN 17  CREATININE 0.74  CALCIUM 9.2   Liver Function Tests:  Recent Labs Lab 10/23/16 0802  AST 24  ALT 13*  ALKPHOS 126  BILITOT 0.9  PROT 7.9  ALBUMIN 4.2    Recent Labs Lab 10/23/16 0802  LIPASE 17   No results for input(s): AMMONIA in the last 168 hours. CBC:  Recent Labs Lab 10/23/16 0802  WBC 12.2*  HGB 12.4  HCT 37.3  MCV 85.7  PLT 425*   Cardiac Enzymes: No results for input(s): CKTOTAL, CKMB, CKMBINDEX, TROPONINI in the last 168 hours.  BNP (last 3 results) No results for input(s): BNP in the last 8760 hours.  ProBNP (last 3 results) No results for input(s): PROBNP in the last 8760 hours.   Serum creatinine: 0.74 mg/dL 10/23/16 0802 Estimated  creatinine clearance: 92 mL/min  CBG:  Recent Labs Lab 10/23/16 1123 10/23/16 1641  GLUCAP 326* 323*    Radiological Exams on Admission: Dg Abd Acute W/chest  Result Date: 10/23/2016 CLINICAL DATA:  Nausea and vomiting since this morning. History of gastroparesis. EXAM: DG ABDOMEN ACUTE W/ 1V CHEST COMPARISON:  07/23/2016 FINDINGS: Bowel gas pattern is within normal limits. Relatively gasless abdomen consistent with the history of vomiting. Surgical clips related to previous cholecystectomy. No free air. One-view chest is normal. IMPRESSION: No acute finding. Relatively gasless abdomen consistent with the clinical history of vomiting. Electronically Signed   By: Nelson Chimes M.D.   On: 10/23/2016 14:12    EKG: Independently reviewed. Sinus tach. No STEMI.  Assessment/Plan Principal Problem:   N&V (nausea and vomiting) Active Problems:   Uncontrolled type 1 diabetes mellitus (HCC)   Asthma   Hepatic steatosis   GERD (gastroesophageal reflux disease)   Depression with anxiety   Nausea/vomiting 2/2 UA Treated in emergency room with 3 different antibiotics with no effect She is still nauseous When necessary Reglan On my review of KUB suspicious area of bowel, will  get CT abdomen with oral contrast with possible small bowel obstruction UDS positive for marijuana UA positive for urinary tract infection starting patient on Rocephin  DM SSI Lantus continued at pt's nl dose  Asthma Prn albuterol  Hepeatic Steatosis No acute abnormalities and liver enzymes on admission  GERD Currently no sx, monitor  Depression with anxiety Denies SI/HI   Code Status: FC DVT Prophylaxis: heparin Family Communication: none at bedside Disposition Plan: Pending Improvement  Status: inpt med surg  Elwin Mocha, MD Family Medicine [facility] [email or web address] Password [email or web address]

## 2016-10-23 NOTE — Progress Notes (Signed)
PHARMACY NOTE - Rocephin  Pharmacy has been consulted for dosing of Rocephin for UTI. MD has already ordered Rocephin 1gm IV q24h and need for further dosage adjustment appears unlikely at present.    Will sign off at this time.  Will monitor for positive cx data via electronic surveillance software.  Please reconsult if a change in clinical status warrants re-evaluation of dosage.  Netta Cedars, PharmD, BCPS 10/23/2016@5 :32 PM

## 2016-10-23 NOTE — ED Notes (Signed)
Patient given water and diet gingerale 

## 2016-10-23 NOTE — ED Provider Notes (Signed)
[facility] DEPT Provider Note   CSN: [id number] Arrival date & time: 10/23/16  3151     History   Chief Complaint Chief Complaint  Patient presents with  . Emesis  . Abdominal Pain    HPI [name] is a 26 y.o. female with a h/o of uncontrolled T1DM with gastroparesis and marijuana use who presents to the emergency department with a chief complaint of constant, worsening generalized abdominal pain with associated nausea and nonbloody, non-bilious emesis that began 1 day ago. No aggravating or alleviating factors. She denies fever, chills, dyspnea, dysuria, back pain, vaginal discharge or pain. No treatment prior to arrival.   She was recently admitted from 09/26/2016 to 09/29/2016 for DKA.   The history is provided by the patient. No language interpreter was used.    Past Medical History:  Diagnosis Date  . Anxiety   . Arthritis   . Asthma   . Diabetes mellitus 2007   IDDM.  poorly controlled, multiple admits with DKA  . Gallstones   . Gastroparesis   . Heart murmur   . Hepatic steatosis 11/26/2014   and hepatomegaly  . Hypertension    NOT CURRENTLY ON ANY BP MED  . Liver mass 11/26/2014  . Pancreatitis, acute 11/26/2014    Patient Active Problem List   Diagnosis Date Noted  . N&V (nausea and vomiting) 10/23/2016  . DKA, type 1 ([place]) 09/26/2016  . Thrombocytosis ([place]) 07/08/2016  . Candiduria, asymptomatic 06/23/2016  . Hyponatremia 06/13/2016  . Hematuria 05/13/2016  . UTI (urinary tract infection) 01/22/2016  . Diarrhea 11/09/2015  . Acute urinary retention   . GERD (gastroesophageal reflux disease) 07/10/2015  . Depression with anxiety 07/10/2015  . Gastroparesis 06/22/2015  . Altered mental status 06/22/2015  . Volume depletion 06/10/2015  . Protein-calorie malnutrition, severe 06/10/2015  . Hyperglycemia   . Elevated bilirubin   . Hematemesis with nausea   . Intractable nausea and vomiting 05/28/2015  . Abdominal pain in female   .  Abdominal pain 05/24/2015  . Hypertension 05/24/2015  . Dehydration   . Chronic diastolic heart failure ([place]) 04/11/2015  . Hematemesis 04/08/2015  . DKA (diabetic ketoacidoses) ([place]) 03/22/2015  . S/P laparoscopic cholecystectomy 02/11/2015  . Postextubation stridor   . Pancreatitis, acute 11/26/2014  . Volume overload 11/26/2014  . Hypokalemia 11/26/2014  . Hepatic steatosis 11/26/2014  . Liver mass 11/26/2014  . Sepsis ([place]) 11/25/2014  . Sinus tachycardia 11/25/2014  . Hypomagnesemia 11/25/2014  . Hypophosphatemia 11/25/2014  . Elevated LFTs 11/24/2014  . AKI (acute kidney injury) ([place]) 11/24/2014  . Migraine headache 11/24/2014  . Asthma 06/29/2012  . Uncontrolled type 1 diabetes mellitus ([place]) 06/19/2010  . Goiter, unspecified 06/19/2010    Past Surgical History:  Procedure Laterality Date  . CHOLECYSTECTOMY N/A 02/11/2015   Procedure: LAPAROSCOPIC CHOLECYSTECTOMY WITH INTRAOPERATIVE CHOLANGIOGRAM;  Surgeon: Greer Pickerel, MD;  Location: [facility]S;  Service: General;  Laterality: N/A;  . ESOPHAGOGASTRODUODENOSCOPY (EGD) WITH PROPOFOL Left 09/20/2014   Procedure: ESOPHAGOGASTRODUODENOSCOPY (EGD) WITH PROPOFOL;  Surgeon: Arta Silence, MD;  Location[facility] ENDOSCOPY;  Service: Endoscopy;  Laterality: Left;  . WISDOM TOOTH EXTRACTION      OB History    Gravida Para Term Preterm AB Living   2 1 0 1 1 1    SAB TAB Ectopic Multiple Live Births   0 1 0 0 1       Home Medications    Prior to Admission medications   Medication Sig Start Date End Date Taking? Authorizing Provider  albuterol (PROVENTIL HFA;VENTOLIN HFA) 108 (90 Base) MCG/ACT inhaler Inhale 1-2 puffs into the lungs every 6 (six) hours as needed for wheezing or shortness of breath.   Yes [provider]  dicyclomine (BENTYL) 20 MG tablet Take 1 tablet (20 mg total) by mouth 2 (two) times daily. 07/13/16  Yes Providence Lanius A, PA-C  insulin aspart (NOVOLOG FLEXPEN) 100 UNIT/ML FlexPen Inject 1-10 Units into the  skin 3 (three) times daily with meals. Pt uses 1 unit for every 10 grams of carbs with meals and snacks for BS greater than 50.   Yes [provider]  insulin glargine (LANTUS) 100 UNIT/ML injection Inject 0.25 mLs (25 Units total) into the skin daily. 09/29/16  Yes Domenic Polite, MD  lisinopril (PRINIVIL,ZESTRIL) 2.5 MG tablet Take 2.5 mg by mouth daily. 10/08/16  Yes [provider]  meloxicam (MOBIC) 7.5 MG tablet Take 1 tablet (7.5 mg total) by mouth daily. 07/18/16  Yes Patrecia Pour, MD  metoCLOPramide (REGLAN) 10 MG tablet Take 1 tablet (10 mg total) by mouth every 6 (six) hours as needed for nausea. 07/23/16  Yes Orlie Dakin, MD  metoprolol tartrate (LOPRESSOR) 25 MG tablet Take 0.5 tablets (12.5 mg total) by mouth 2 (two) times daily. 05/10/16  Yes Mariel Aloe, MD  ondansetron (ZOFRAN) 4 MG tablet Take 1 tablet (4 mg total) by mouth every 6 (six) hours. Patient taking differently: Take 4 mg by mouth every 8 (eight) hours as needed for nausea.  07/13/16  Yes Providence Lanius A, PA-C  RESTASIS MULTIDOSE 0.05 % ophthalmic emulsion Place 1 drop into both eyes every 12 (twelve) hours. 09/08/16  Yes [provider]  Vitamin D, Ergocalciferol, (DRISDOL) 50000 units CAPS capsule Take 50,000 Units by mouth once a week. On fridays 05/26/16  Yes [provider]  glucagon (GLUCAGON EMERGENCY) 1 MG injection Inject 1 mg into the vein once as needed. Patient taking differently: Inject 1 mg into the vein once as needed. High BS 06/14/16   Kerney Elbe, DO    Family History Family History  Problem Relation Age of Onset  . Heart disease Maternal Grandmother   . Heart disease Maternal Grandfather   . Diabetes Mother   . Hyperlipidemia Mother   . Hypertension Father   . Heart disease Father   . Hypertension Paternal Grandmother   . Cancer Paternal Grandfather     Social History Social History  Substance Use Topics  . Smoking status: Never Smoker  .  Smokeless tobacco: Never Used  . Alcohol use No     Allergies   Peanut-containing drug products; Food; and Ultram [tramadol]   Review of Systems Review of Systems  Constitutional: Negative for activity change, chills and fever.  Respiratory: Negative for shortness of breath.   Cardiovascular: Negative for chest pain.  Gastrointestinal: Positive for abdominal pain, nausea and vomiting. Negative for abdominal distention and constipation.  Genitourinary: Negative for dysuria and vaginal discharge.  Musculoskeletal: Negative for back pain.  Skin: Negative for rash.  Allergic/Immunologic: Positive for immunocompromised state.  Neurological: Negative for headaches.   Physical Exam Updated Vital Signs BP (!) 150/73 (BP Location: Right Arm)   Pulse (!) 127   Temp 98.4 F (36.9 C) (Oral)   Resp 17   Ht 5\' 4"  (1.626 m)   Wt 55.9 kg (123 lb 3.8 oz)   LMP 09/19/2016 (Approximate) Comment: Neg HCG today 10/23/2016  SpO2 100%   BMI 21.15 kg/m   Physical Exam  Constitutional: No distress.  HENT:  Head: Normocephalic.  Eyes: Conjunctivae are normal.  Neck: Neck supple.  Cardiovascular: Normal rate and regular rhythm.  Exam reveals no gallop and no friction rub.   No murmur heard. Pulmonary/Chest: Effort normal. No respiratory distress. She has no wheezes. She has no rales.  Abdominal: Soft. Bowel sounds are normal. She exhibits no distension and no mass. There is tenderness. There is no rebound and no guarding.  Diffusely tender to palpation over the entire abdomen. The patient is writhing in pain on the bed.  Neurological: She is alert.  Skin: Skin is warm. No rash noted.  Psychiatric: Her behavior is normal.  Nursing note and vitals reviewed.  ED Treatments / Results  Labs (all labs ordered are listed, but only abnormal results are displayed) Labs Reviewed  CBC - Abnormal; Notable for the following:       Result Value   WBC 12.2 (*)    Platelets 425 (*)    All other  components within normal limits  COMPREHENSIVE METABOLIC PANEL - Abnormal; Notable for the following:    CO2 21 (*)    Glucose, Bld 439 (*)    ALT 13 (*)    All other components within normal limits  URINALYSIS, ROUTINE W REFLEX MICROSCOPIC - Abnormal; Notable for the following:    APPearance CLOUDY (*)    Glucose, UA >=500 (*)    Ketones, ur 80 (*)    Leukocytes, UA LARGE (*)    Bacteria, UA RARE (*)    Squamous Epithelial / LPF 0-5 (*)    All other components within normal limits  RAPID URINE DRUG SCREEN, HOSP PERFORMED - Abnormal; Notable for the following:    Opiates POSITIVE (*)    Tetrahydrocannabinol POSITIVE (*)    All other components within normal limits  GLUCOSE, CAPILLARY - Abnormal; Notable for the following:    Glucose-Capillary 323 (*)    All other components within normal limits  CBG MONITORING, ED - Abnormal; Notable for the following:    Glucose-Capillary 326 (*)    All other components within normal limits  LIPASE, BLOOD  BASIC METABOLIC PANEL  CBC  I-STAT CG4 LACTIC ACID, ED  I-STAT BETA HCG BLOOD, ED ([facility], [facility], [facility] ONLY)    EKG  EKG Interpretation  Date/Time:  Friday October 23 2016 15:16:34 EDT Ventricular Rate:  128 PR Interval:    QRS Duration: 85 QT Interval:  300 QTC Calculation: 438 R Axis:   80 Text Interpretation:  Sinus tachycardia LAE, consider biatrial enlargement Baseline wander in lead(s) III V3 No significant change since last tracing Confirmed by Orlie Dakin [phone]) on 10/23/2016 3:21:57 PM       Radiology Dg Abd Acute W/chest  Result Date: 10/23/2016 CLINICAL DATA:  Nausea and vomiting since this morning. History of gastroparesis. EXAM: DG ABDOMEN ACUTE W/ 1V CHEST COMPARISON:  07/23/2016 FINDINGS: Bowel gas pattern is within normal limits. Relatively gasless abdomen consistent with the history of vomiting. Surgical clips related to previous cholecystectomy. No free air. One-view chest is normal. IMPRESSION: No acute finding.  Relatively gasless abdomen consistent with the clinical history of vomiting. Electronically Signed   By: Nelson Chimes M.D.   On: 10/23/2016 14:12    Procedures Procedures (including critical care time)  Medications Ordered in ED Medications  Influenza vac split quadrivalent PF (FLUARIX) injection 0.5 mL (not administered)  insulin aspart (novoLOG) injection 0-15 Units (not administered)  albuterol (PROVENTIL) (2.5 MG/3ML) 0.083% nebulizer solution 3 mL (not administered)  insulin glargine (LANTUS)  injection 25 Units (not administered)  cycloSPORINE (RESTASIS) 0.05 % ophthalmic emulsion 1 drop (not administered)  hydrALAZINE (APRESOLINE) injection 10 mg (not administered)  heparin injection 5,000 Units (5,000 Units Subcutaneous Not Given 10/23/16 1400)  sodium chloride flush (NS) 0.9 % injection 3 mL (3 mLs Intravenous Given 10/23/16 1703)  0.9 %  sodium chloride infusion ( Intravenous New Bag/Given 10/23/16 1637)  acetaminophen (TYLENOL) tablet 650 mg (not administered)    Or  acetaminophen (TYLENOL) suppository 650 mg (not administered)  metoCLOPramide (REGLAN) injection 5 mg (not administered)  metoCLOPramide (REGLAN) injection 10 mg (10 mg Intravenous Given 10/23/16 0812)  diphenhydrAMINE (BENADRYL) injection 25 mg (25 mg Intravenous Given 10/23/16 0812)  haloperidol lactate (HALDOL) injection 2 mg (2 mg Intravenous Given 10/23/16 0812)  ketorolac (TORADOL) 30 MG/ML injection 30 mg (30 mg Intravenous Given 10/23/16 0812)  sodium chloride 0.9 % bolus 1,000 mL (0 mLs Intravenous Stopped 10/23/16 0937)  sodium chloride 0.9 % bolus 1,000 mL (0 mLs Intravenous Stopped 10/23/16 1137)  morphine 4 MG/ML injection 4 mg (4 mg Intravenous Given 10/23/16 1026)  insulin aspart (novoLOG) injection 10 Units (10 Units Subcutaneous Given 10/23/16 1030)  promethazine (PHENERGAN) injection 12.5 mg (12.5 mg Intravenous Given 10/23/16 1026)  diphenhydrAMINE (BENADRYL) injection 12.5 mg (12.5 mg Intravenous Given  10/23/16 1026)  prochlorperazine (COMPAZINE) injection 10 mg (10 mg Intravenous Given 10/23/16 1309)  diphenhydrAMINE (BENADRYL) injection 12.5 mg (12.5 mg Intravenous Given 10/23/16 1306)  morphine 2 MG/ML injection 2 mg (2 mg Intravenous Given 10/23/16 1313)     Initial Impression / Assessment and Plan / ED Course  I have reviewed the triage vital signs and the nursing notes.  Pertinent labs & imaging results that were available during my care of the patient were reviewed by me and considered in my medical decision making (see chart for details).  Clinical Course as of Oct 23 1709  Fri Oct 23, 2016  0851 Patient recheck. Her emesis bag in the room is empty. She states she is still not feeling any better.   [MM]  1222 Patient recheck. The patient has been able to drink some diet ginger ale; however while I am in the room she begins to actively vomit. Will consult the hospitalist for admission.   [MM]    Clinical Course User Index [MM] Caci Orren A, PA-C    26 year old female with a history of poorly controlled type 1 diabetes and diabetic gastroparesis presenting with nausea vomiting and abdominal pain x1 day. CBG 439; no anion gap. The patient was given Reglan, Toradol, and Benadryl, which she initially reported improved her N/V, but her abdomen remained painful 2 one-liter IVF boluses were given in the ED, improving her CBG to 326. on reexamination she was nauseated and vomiting again and was treated with phenergan. She reports she was feeling better so she was fluid challenged, but continued to vomit. Compazine was ordered. She was initially non-tachycardic on arrival, but became progressively tachycardic throughout her ED visit. Consult to the hospitalist and spoke with Dr. Aggie Moats who will admit the patient for continued workup and evaluation of intractable vomiting. The patient appears reasonably stabilized for admission considering the current resources, flow, and capabilities available in  the ED at this time, and I doubt any other [facility] requiring further screening and/or treatment in the ED prior to admission.  Final Clinical Impressions(s) / ED Diagnoses   Final diagnoses:  N&V (nausea and vomiting)    New Prescriptions Current Discharge Medication List  Joanne Gavel, PA-C 10/23/16 1712    Lacretia Leigh, MD 10/26/16 [phone]

## 2016-10-24 DIAGNOSIS — Z8349 Family history of other endocrine, nutritional and metabolic diseases: Secondary | ICD-10-CM | POA: Diagnosis not present

## 2016-10-24 DIAGNOSIS — K219 Gastro-esophageal reflux disease without esophagitis: Secondary | ICD-10-CM

## 2016-10-24 DIAGNOSIS — E1010 Type 1 diabetes mellitus with ketoacidosis without coma: Secondary | ICD-10-CM | POA: Diagnosis present

## 2016-10-24 DIAGNOSIS — E1165 Type 2 diabetes mellitus with hyperglycemia: Secondary | ICD-10-CM | POA: Diagnosis not present

## 2016-10-24 DIAGNOSIS — E1043 Type 1 diabetes mellitus with diabetic autonomic (poly)neuropathy: Secondary | ICD-10-CM | POA: Diagnosis present

## 2016-10-24 DIAGNOSIS — D72829 Elevated white blood cell count, unspecified: Secondary | ICD-10-CM | POA: Diagnosis not present

## 2016-10-24 DIAGNOSIS — E10641 Type 1 diabetes mellitus with hypoglycemia with coma: Secondary | ICD-10-CM | POA: Diagnosis not present

## 2016-10-24 DIAGNOSIS — I10 Essential (primary) hypertension: Secondary | ICD-10-CM | POA: Diagnosis present

## 2016-10-24 DIAGNOSIS — R1013 Epigastric pain: Secondary | ICD-10-CM | POA: Diagnosis not present

## 2016-10-24 DIAGNOSIS — Z79899 Other long term (current) drug therapy: Secondary | ICD-10-CM | POA: Diagnosis not present

## 2016-10-24 DIAGNOSIS — K3184 Gastroparesis: Secondary | ICD-10-CM | POA: Diagnosis present

## 2016-10-24 DIAGNOSIS — E860 Dehydration: Secondary | ICD-10-CM

## 2016-10-24 DIAGNOSIS — Z888 Allergy status to other drugs, medicaments and biological substances status: Secondary | ICD-10-CM | POA: Diagnosis not present

## 2016-10-24 DIAGNOSIS — F502 Bulimia nervosa: Secondary | ICD-10-CM | POA: Diagnosis not present

## 2016-10-24 DIAGNOSIS — K760 Fatty (change of) liver, not elsewhere classified: Secondary | ICD-10-CM

## 2016-10-24 DIAGNOSIS — Z91018 Allergy to other foods: Secondary | ICD-10-CM | POA: Diagnosis not present

## 2016-10-24 DIAGNOSIS — F418 Other specified anxiety disorders: Secondary | ICD-10-CM

## 2016-10-24 DIAGNOSIS — Z8719 Personal history of other diseases of the digestive system: Secondary | ICD-10-CM | POA: Diagnosis not present

## 2016-10-24 DIAGNOSIS — Z809 Family history of malignant neoplasm, unspecified: Secondary | ICD-10-CM | POA: Diagnosis not present

## 2016-10-24 DIAGNOSIS — G43A0 Cyclical vomiting, not intractable: Secondary | ICD-10-CM | POA: Diagnosis not present

## 2016-10-24 DIAGNOSIS — J4520 Mild intermittent asthma, uncomplicated: Secondary | ICD-10-CM

## 2016-10-24 DIAGNOSIS — Z794 Long term (current) use of insulin: Secondary | ICD-10-CM | POA: Diagnosis not present

## 2016-10-24 DIAGNOSIS — E1065 Type 1 diabetes mellitus with hyperglycemia: Secondary | ICD-10-CM | POA: Diagnosis not present

## 2016-10-24 DIAGNOSIS — R112 Nausea with vomiting, unspecified: Secondary | ICD-10-CM | POA: Diagnosis present

## 2016-10-24 DIAGNOSIS — Z833 Family history of diabetes mellitus: Secondary | ICD-10-CM | POA: Diagnosis not present

## 2016-10-24 DIAGNOSIS — B373 Candidiasis of vulva and vagina: Secondary | ICD-10-CM | POA: Diagnosis present

## 2016-10-24 DIAGNOSIS — J45909 Unspecified asthma, uncomplicated: Secondary | ICD-10-CM | POA: Diagnosis present

## 2016-10-24 DIAGNOSIS — N390 Urinary tract infection, site not specified: Secondary | ICD-10-CM | POA: Diagnosis not present

## 2016-10-24 DIAGNOSIS — F12188 Cannabis abuse with other cannabis-induced disorder: Secondary | ICD-10-CM | POA: Diagnosis present

## 2016-10-24 DIAGNOSIS — Z91010 Allergy to peanuts: Secondary | ICD-10-CM | POA: Diagnosis not present

## 2016-10-24 LAB — BASIC METABOLIC PANEL
Anion gap: 9 (ref 5–15)
BUN: 14 mg/dL (ref 6–20)
CO2: 19 mmol/L — ABNORMAL LOW (ref 22–32)
Calcium: 8.2 mg/dL — ABNORMAL LOW (ref 8.9–10.3)
Chloride: 109 mmol/L (ref 101–111)
Creatinine, Ser: 0.51 mg/dL (ref 0.44–1.00)
GFR calc Af Amer: >60 mL/min (ref >60)
GFR calc non Af Amer: >60 mL/min (ref >60)
Glucose, Bld: 203 mg/dL — ABNORMAL HIGH (ref 65–99)
Potassium: 3.6 mmol/L (ref 3.5–5.1)
Sodium: 137 mmol/L (ref 135–145)

## 2016-10-24 LAB — CBC
HCT: 36.6 % (ref 36.0–46.0)
HEMOGLOBIN: 12 g/dL (ref 12.0–15.0)
MCH: 28.6 pg (ref 26.0–34.0)
MCHC: 32.8 g/dL (ref 30.0–36.0)
MCV: 87.4 fL (ref 78.0–100.0)
PLATELETS: 387 10*3/uL (ref 150–400)
RBC: 4.19 MIL/uL (ref 3.87–5.11)
RDW: 14.7 % (ref 11.5–15.5)
WBC: 7.8 10*3/uL (ref 4.0–10.5)

## 2016-10-24 LAB — URINE CULTURE

## 2016-10-24 LAB — GLUCOSE, CAPILLARY
GLUCOSE-CAPILLARY: 160 mg/dL — AB (ref 65–99)
GLUCOSE-CAPILLARY: 122 mg/dL — AB (ref 65–99)
Glucose-Capillary: 188 mg/dL — ABNORMAL HIGH (ref 65–99)
Glucose-Capillary: 87 mg/dL (ref 65–99)
Glucose-Capillary: 210 mg/dL — ABNORMAL HIGH (ref 65–99)

## 2016-10-24 MED ORDER — PROMETHAZINE HCL 25 MG/ML IJ SOLN
12.5000 mg | Freq: Four times a day (QID) | INTRAMUSCULAR | Status: DC | PRN
  Administered 2016-10-24 – 2016-10-28 (×10): 12.5 mg via INTRAVENOUS
  Filled 2016-10-24 (×11): qty 1

## 2016-10-24 MED ORDER — SODIUM CHLORIDE 0.9 % IV SOLN
INTRAVENOUS | Status: DC
  Administered 2016-10-24: 15:00:00 via INTRAVENOUS

## 2016-10-24 MED ORDER — PANTOPRAZOLE SODIUM 40 MG IV SOLR
40.0000 mg | INTRAVENOUS | Status: DC
  Administered 2016-10-24 – 2016-10-28 (×5): 40 mg via INTRAVENOUS
  Filled 2016-10-24 (×5): qty 40

## 2016-10-24 MED ORDER — KETOROLAC TROMETHAMINE 30 MG/ML IJ SOLN
30.0000 mg | Freq: Four times a day (QID) | INTRAMUSCULAR | Status: AC | PRN
  Administered 2016-10-24 – 2016-10-28 (×12): 30 mg via INTRAVENOUS
  Filled 2016-10-24 (×12): qty 1

## 2016-10-24 MED ORDER — PROMETHAZINE HCL 25 MG/ML IJ SOLN
12.5000 mg | Freq: Once | INTRAMUSCULAR | Status: AC
  Administered 2016-10-24: 12.5 mg via INTRAVENOUS
  Filled 2016-10-24: qty 1

## 2016-10-24 MED ORDER — DICYCLOMINE HCL 20 MG PO TABS
20.0000 mg | ORAL_TABLET | Freq: Two times a day (BID) | ORAL | Status: DC
  Administered 2016-10-24 – 2016-10-29 (×7): 20 mg via ORAL
  Filled 2016-10-24 (×12): qty 1

## 2016-10-24 NOTE — Progress Notes (Signed)
PROGRESS NOTE    [name]  QMV:[id number] DOB: 11-07-1990 DOA: 10/23/2016 PCP: Vicenta Aly, FNP  Brief Narrative:  [name] is a 25 y.o. female  with past medical history of anxiety, asthma, diabetes, gastroparesis and pancreatitis who presents emergency room with chief complaint nausea and vomiting. Patient states she had acute onset of nausea vomiting around midnight. She vomitted continuously for 10 hours and was Nonbloody and nonbilious. Patient denies any diarrhea. No recent medication changes. Denies any drug use. No heavy alcohol use. She was given 3 different antiemetics without effect. Hospitalists were consulted for admission and patient being treated for her N/V and is likely from UTI, Hyperemesis Cannabinoid Syndrome, Diabetic Gastroparesis or combination of those. Complaining of whole body pain.   Assessment & Plan:   Principal Problem:   N&V (nausea and vomiting) Active Problems:   Uncontrolled type 1 diabetes mellitus (HCC)   Asthma   Hepatic steatosis   Dehydration   Abdominal pain   GERD (gastroesophageal reflux disease)   Depression with anxiety   UTI (urinary tract infection)   Leukocytosis  Intractable Nausea/Vomiting from either Hyperemesis Cannabinoid Syndrome, Diabetic Gastroparesis or Acute UTI -Treated in emergency room with 3 different antibiotics with no effect -She was still Nauseous this AM; C/w Antiemetics with Promethazine 12.5 mg IV q6hprn for N/V and c/w Metoclopramide 5 mg IV q8h -KUB showed No acute finding. Relatively gasless abdomen consistent with the clinical history of vomiting -CT Scan of Abd/Pelvis showed No acute intra-abdominal process. Prominent distention of the stomach and duodenum, which may be related to patient's history of gastroparesis.  Marked bladder distention. Air within the bladder is likely related to recent instrumentation. No hydronephrosis. Four hyperenhancing lesions within the liver are similar in  size to prior study, previously characterized as hepatic adenomas  -UDS positive for Marijuana and Opiates -UA positive for UTI as it showed cloudy urine, Large Leukocytes, TNTC WBC -Urine Cx pending; Patient started on IV Ceftriaxone -C/w IVF Rehydration (NS at 125 mL -> 75 mL/hr) -Clear Liquid Diet -Keep Blood Sugars titrated <200 -Continue to Monitor and Avoid Opiate Narcotics  -C/w Toradol 30 mg IV q6hprn for Pain Control  -Restart Bentyl 20 mg po BID  Insulin Dependent Diabetes Mellitus  -Last HbA1c was 7.7 -Repeat HbA1c this hospitalization  -C/w Insulin Glargine 25 units sq qHS -C/w Moderate Novolog SSI Insulin AC -CBG's ranging from 122-160  UTI -As above -C/w Ceftriaxone; Follow Urine Cx.  Asthma -C/w Albuterol 3 mL IH q6hprn for Wheezing/SOB  Hepeatic Steatosis -Continue to Monitor -LFT's normal on Admission  GERD -Currently no Symptoms -Add Pantoprazole 40 mg IV while Nauseous and Vomiting   Depression with Anxiety -Denies SI/HI -Continue to Monitor   Leukocytosis -Likely reactive from UTI vs. N/V -WBC went from 12.2 -> 7.8 -Repeat CBC in AM  DVT prophylaxis: Heparin 5,000 sq q8h Code Status: FULL CODE Family Communication: No family present at bedside Disposition Plan: [name] for further treatment; Anticipate D/C in 24-48 hours if stable  Consultants:   None   Procedures: None  Antimicrobials:  Anti-infectives    Start     Dose/Rate Route Frequency Ordered Stop   10/23/16 1800  cefTRIAXone (ROCEPHIN) 1 g in dextrose 5 % 50 mL IVPB     1 g 100 mL/hr over 30 Minutes Intravenous Every 24 hours 10/23/16 1716       Subjective: Seen and examined and was resting but when awoken started writhing in pain. No SOB. Complains of diffuse abdominal pain  body pain and was asking for morphine. No lightheadedness or dizziness. No other concerns or complaints at this time.   Objective: Vitals:   10/23/16 2011 10/24/16 0431 10/24/16 1304  10/24/16 1306  BP: 123/73 127/73 (!) 149/110 (!) 153/107  Pulse: (!) 121 (!) 108 (!) 112   Resp: 18 18 20    Temp: 99 F (37.2 C) 98.3 F (36.8 C) 98.7 F (37.1 C)   TempSrc: Oral Oral Oral   SpO2: 100% 100% 100%   Weight:      Height:        Intake/Output Summary (Last 24 hours) at 10/24/16 1443 Last data filed at 10/24/16 0600  Gross per 24 hour  Intake          1962.92 ml  Output                2 ml  Net          1960.92 ml   Filed Weights   10/23/16 0528 10/23/16 1613  Weight: 56.7 kg (125 lb) 55.9 kg (123 lb 3.8 oz)   Examination: Physical Exam:  Constitutional: Thin AAF in  Some mild distress and appears calm but uncomfortable Eyes: Lids and conjunctivae normal, sclerae anicteric  ENMT: External Ears, Nose appear normal. Grossly normal hearing. Mucous membranes are moist. Neck: Appears normal, supple, no cervical masses, normal ROM, no appreciable thyromegaly, no JVD Respiratory: Clear to auscultation bilaterally, no wheezing, rales, rhonchi or crackles. Normal respiratory effort and patient is not tachypenic. Cardiovascular: Tachycardic rate but regular rhythm, no murmurs / rubs / gallops. S1 and S2 auscultated. No extremity edema.  Abdomen: Soft, non-tender, non-distended. No masses palpated. No appreciable hepatosplenomegaly. Bowel sounds positive x4  GU: Deferred. Musculoskeletal: No clubbing / cyanosis of digits/nails. No joint deformity upper and lower extremities. Good ROM, no contractures. Skin: No rashes, lesions, ulcers on a limited skin eval. No induration; Warm and dry.  Neurologic: CN 2-12 grossly intact with no focal deficits. Romberg sign cerebellar reflexes not assessed.  Psychiatric: Normal judgment and insight. Alert and oriented x 3. Normal mood and flat affect.   Data Reviewed: I have personally reviewed following labs and imaging studies  CBC:  Recent Labs Lab 10/23/16 0802 10/24/16 0439  WBC 12.2* 7.8  HGB 12.4 12.0  HCT 37.3 36.6  MCV  85.7 87.4  PLT 425* 517   Basic Metabolic Panel:  Recent Labs Lab 10/23/16 0802 10/24/16 0439  NA 138 137  K 3.9 3.6  CL 102 109  CO2 21* 19*  GLUCOSE 439* 203*  BUN 17 14  CREATININE 0.74 0.51  CALCIUM 9.2 8.2*   GFR: Estimated Creatinine Clearance: 92 mL/min (by C-G formula based on SCr of 0.51 mg/dL). Liver Function Tests:  Recent Labs Lab 10/23/16 0802  AST 24  ALT 13*  ALKPHOS 126  BILITOT 0.9  PROT 7.9  ALBUMIN 4.2    Recent Labs Lab 10/23/16 0802  LIPASE 17   No results for input(s): AMMONIA in the last 168 hours. Coagulation Profile: No results for input(s): INR, PROTIME in the last 168 hours. Cardiac Enzymes: No results for input(s): CKTOTAL, CKMB, CKMBINDEX, TROPONINI in the last 168 hours. BNP (last 3 results) No results for input(s): PROBNP in the last 8760 hours. HbA1C: No results for input(s): HGBA1C in the last 72 hours. CBG:  Recent Labs Lab 10/23/16 2118 10/23/16 2359 10/24/16 0435 10/24/16 0739 10/24/16 1207  GLUCAP 198* 220* 188* 160* 122*   Lipid Profile: No results for input(s): CHOL,  HDL, LDLCALC, TRIG, CHOLHDL, LDLDIRECT in the last 72 hours. Thyroid Function Tests: No results for input(s): TSH, T4TOTAL, FREET4, T3FREE, THYROIDAB in the last 72 hours. Anemia Panel: No results for input(s): VITAMINB12, FOLATE, FERRITIN, TIBC, IRON, RETICCTPCT in the last 72 hours. Sepsis Labs:  Recent Labs Lab 10/23/16 0815  LATICACIDVEN 1.72    No results found for this or any previous visit (from the past 240 hour(s)).   Radiology Studies: Ct Abdomen Pelvis W Contrast  Result Date: 10/23/2016 CLINICAL DATA:  Nausea and vomiting. EXAM: CT ABDOMEN AND PELVIS WITH CONTRAST TECHNIQUE: Multidetector CT imaging of the abdomen and pelvis was performed using the standard protocol following bolus administration of intravenous contrast. CONTRAST:  167mL ISOVUE-300 IOPAMIDOL (ISOVUE-300) INJECTION 61% COMPARISON:  CT abdomen pelvis dated  February 20, 2016. FINDINGS: Lower chest: No acute abnormality. Hepatobiliary: Four hyperenhancing lesions within the liver are again seen, previously characterized as hepatic adenomas. Status post cholecystectomy. No biliary dilatation. Pancreas: Unremarkable. No pancreatic ductal dilatation or surrounding inflammatory changes. Spleen: Normal in size without focal abnormality. Adrenals/Urinary Tract: Adrenal glands are unremarkable. Kidneys are normal, without renal calculi, focal lesion, or hydronephrosis. The bladder is markedly distended. Air within the bladder is likely related to recent instrumentation. Stomach/Bowel: The stomach and duodenum are prominently distended. Appendix appears normal. No evidence of bowel wall thickening, distention, or inflammatory changes. Vascular/Lymphatic: No significant vascular findings are present. Probable mixing artifact in the portal system. No enlarged abdominal or pelvic lymph nodes. Reproductive: Uterus and bilateral adnexa are unremarkable. Right corpus luteum. Other: Trace free fluid in the pelvis is likely physiologic. No pneumoperitoneum. Musculoskeletal: No acute or significant osseous findings. IMPRESSION: 1. No acute intra-abdominal process. 2. Prominent distention of the stomach and duodenum, which may be related to patient's history of gastroparesis. 3. Marked bladder distention. Air within the bladder is likely related to recent instrumentation. No hydronephrosis. 4. Four hyperenhancing lesions within the liver are similar in size to prior study, previously characterized as hepatic adenomas. Electronically Signed   By: Titus Dubin M.D.   On: 10/23/2016 20:26   Dg Abd Acute W/chest  Result Date: 10/23/2016 CLINICAL DATA:  Nausea and vomiting since this morning. History of gastroparesis. EXAM: DG ABDOMEN ACUTE W/ 1V CHEST COMPARISON:  07/23/2016 FINDINGS: Bowel gas pattern is within normal limits. Relatively gasless abdomen consistent with the history of  vomiting. Surgical clips related to previous cholecystectomy. No free air. One-view chest is normal. IMPRESSION: No acute finding. Relatively gasless abdomen consistent with the clinical history of vomiting. Electronically Signed   By: Nelson Chimes M.D.   On: 10/23/2016 14:12   Scheduled Meds: . cycloSPORINE  1 drop Both Eyes Q12H  . dicyclomine  20 mg Oral BID  . heparin  5,000 Units Subcutaneous Q8H  . insulin aspart  0-15 Units Subcutaneous TID WC  . insulin glargine  25 Units Subcutaneous QHS  . pantoprazole (PROTONIX) IV  40 mg Intravenous Q24H  . sodium chloride flush  3 mL Intravenous Q12H   Continuous Infusions: . sodium chloride    . cefTRIAXone (ROCEPHIN)  IV 1 g (10/23/16 1819)     LOS: 0 days   Kerney Elbe, DO [facility] Pager [phone]  If 7PM-7AM, please contact night-coverage [email or web address] Password [email or web address] 10/24/2016, 2:43 PM

## 2016-10-25 DIAGNOSIS — G43A0 Cyclical vomiting, not intractable: Secondary | ICD-10-CM

## 2016-10-25 LAB — URINALYSIS, MICROSCOPIC (REFLEX)
RBC / HPF: NONE SEEN RBC/hpf (ref 0–5)
SQUAMOUS EPITHELIAL / LPF: NONE SEEN

## 2016-10-25 LAB — BASIC METABOLIC PANEL
ANION GAP: 14 (ref 5–15)
ANION GAP: 15 (ref 5–15)
Anion gap: 12 (ref 5–15)
BUN: 14 mg/dL (ref 6–20)
BUN: 13 mg/dL (ref 6–20)
BUN: 13 mg/dL (ref 6–20)
CALCIUM: 8.6 mg/dL — AB (ref 8.9–10.3)
CHLORIDE: 107 mmol/L (ref 101–111)
CO2: 12 mmol/L — ABNORMAL LOW (ref 22–32)
CO2: 14 mmol/L — ABNORMAL LOW (ref 22–32)
CO2: 15 mmol/L — ABNORMAL LOW (ref 22–32)
CREATININE: 0.57 mg/dL (ref 0.44–1.00)
Calcium: 8.9 mg/dL (ref 8.9–10.3)
Calcium: 9.2 mg/dL (ref 8.9–10.3)
Chloride: 114 mmol/L — ABNORMAL HIGH (ref 101–111)
Chloride: 111 mmol/L (ref 101–111)
Creatinine, Ser: 0.67 mg/dL (ref 0.44–1.00)
Creatinine, Ser: 0.61 mg/dL (ref 0.44–1.00)
GFR calc Af Amer: >60 mL/min (ref >60)
GFR calc non Af Amer: >60 mL/min (ref >60)
GLUCOSE: 145 mg/dL — AB (ref 65–99)
GLUCOSE: 179 mg/dL — AB (ref 65–99)
Glucose, Bld: 142 mg/dL — ABNORMAL HIGH (ref 65–99)
POTASSIUM: 3.5 mmol/L (ref 3.5–5.1)
Potassium: 5 mmol/L (ref 3.5–5.1)
Potassium: 3.8 mmol/L (ref 3.5–5.1)
SODIUM: 137 mmol/L (ref 135–145)
SODIUM: 137 mmol/L (ref 135–145)
Sodium: 140 mmol/L (ref 135–145)

## 2016-10-25 LAB — BLOOD GAS, ARTERIAL
ACID-BASE DEFICIT: 12.5 mmol/L — AB (ref 0.0–2.0)
Bicarbonate: 12 mmol/L — ABNORMAL LOW (ref 20.0–28.0)
DRAWN BY: 244801
O2 Saturation: 97.9 %
PCO2 ART: 24.5 mmHg — AB (ref 32.0–48.0)
PH ART: 7.312 — AB (ref 7.350–7.450)
Patient temperature: 37
pO2, Arterial: 113 mmHg — ABNORMAL HIGH (ref 83.0–108.0)

## 2016-10-25 LAB — CBC WITH DIFFERENTIAL/PLATELET
BASOS PCT: 0 %
Basophils Absolute: 0 10*3/uL (ref 0.0–0.1)
EOS ABS: 0.1 10*3/uL (ref 0.0–0.7)
EOS PCT: 1 %
HCT: 39.2 % (ref 36.0–46.0)
HEMOGLOBIN: 12.7 g/dL (ref 12.0–15.0)
Lymphocytes Relative: 16 %
Lymphs Abs: 1.4 10*3/uL (ref 0.7–4.0)
MCH: 28.3 pg (ref 26.0–34.0)
MCHC: 32.4 g/dL (ref 30.0–36.0)
MCV: 87.5 fL (ref 78.0–100.0)
Monocytes Absolute: 0.6 10*3/uL (ref 0.1–1.0)
Monocytes Relative: 7 %
NEUTROS PCT: 76 %
Neutro Abs: 6.9 10*3/uL (ref 1.7–7.7)
PLATELETS: 409 10*3/uL — AB (ref 150–400)
RBC: 4.48 MIL/uL (ref 3.87–5.11)
RDW: 14.3 % (ref 11.5–15.5)
WBC: 9 10*3/uL (ref 4.0–10.5)

## 2016-10-25 LAB — COMPREHENSIVE METABOLIC PANEL
ALK PHOS: 116 U/L (ref 38–126)
ALT: 12 U/L — AB (ref 14–54)
ANION GAP: 16 — AB (ref 5–15)
AST: 12 U/L — ABNORMAL LOW (ref 15–41)
Albumin: 3.9 g/dL (ref 3.5–5.0)
BUN: 13 mg/dL (ref 6–20)
CALCIUM: 8.9 mg/dL (ref 8.9–10.3)
CHLORIDE: 109 mmol/L (ref 101–111)
CO2: 12 mmol/L — AB (ref 22–32)
Creatinine, Ser: 0.68 mg/dL (ref 0.44–1.00)
GFR calc Af Amer: >60 mL/min (ref >60)
GFR calc non Af Amer: >60 mL/min (ref >60)
GLUCOSE: 242 mg/dL — AB (ref 65–99)
Potassium: 4 mmol/L (ref 3.5–5.1)
SODIUM: 137 mmol/L (ref 135–145)
Total Bilirubin: 1 mg/dL (ref 0.3–1.2)
Total Protein: 7.4 g/dL (ref 6.5–8.1)

## 2016-10-25 LAB — MAGNESIUM
MAGNESIUM: 1.9 mg/dL (ref 1.7–2.4)
Magnesium: 1.8 mg/dL (ref 1.7–2.4)

## 2016-10-25 LAB — PHOSPHORUS: PHOSPHORUS: 2.8 mg/dL (ref 2.5–4.6)

## 2016-10-25 LAB — URINALYSIS, ROUTINE W REFLEX MICROSCOPIC
Bilirubin Urine: NEGATIVE
GLUCOSE, UA: NEGATIVE mg/dL
Ketones, ur: >80 mg/dL — AB
Nitrite: NEGATIVE
PH: 6 (ref 5.0–8.0)
PROTEIN: 30 mg/dL — AB
SPECIFIC GRAVITY, URINE: 1.025 (ref 1.005–1.030)

## 2016-10-25 LAB — GLUCOSE, CAPILLARY
GLUCOSE-CAPILLARY: 102 mg/dL — AB (ref 65–99)
GLUCOSE-CAPILLARY: 158 mg/dL — AB (ref 65–99)
GLUCOSE-CAPILLARY: 159 mg/dL — AB (ref 65–99)
GLUCOSE-CAPILLARY: 168 mg/dL — AB (ref 65–99)
GLUCOSE-CAPILLARY: 171 mg/dL — AB (ref 65–99)
GLUCOSE-CAPILLARY: 207 mg/dL — AB (ref 65–99)
GLUCOSE-CAPILLARY: 223 mg/dL — AB (ref 65–99)
GLUCOSE-CAPILLARY: 237 mg/dL — AB (ref 65–99)
GLUCOSE-CAPILLARY: 90 mg/dL (ref 65–99)
Glucose-Capillary: 130 mg/dL — ABNORMAL HIGH (ref 65–99)
Glucose-Capillary: 154 mg/dL — ABNORMAL HIGH (ref 65–99)
Glucose-Capillary: 162 mg/dL — ABNORMAL HIGH (ref 65–99)

## 2016-10-25 LAB — LIPASE, BLOOD
Lipase: 14 U/L (ref 11–51)
Lipase: 17 U/L (ref 11–51)

## 2016-10-25 LAB — TSH: TSH: 0.766 u[IU]/mL (ref 0.350–4.500)

## 2016-10-25 MED ORDER — POTASSIUM CHLORIDE 10 MEQ/100ML IV SOLN
10.0000 meq | INTRAVENOUS | Status: AC
  Administered 2016-10-25 (×2): 10 meq via INTRAVENOUS
  Filled 2016-10-25 (×2): qty 100

## 2016-10-25 MED ORDER — TRAMADOL HCL 50 MG PO TABS: 50.0000 mg | ORAL_TABLET | Freq: Four times a day (QID) | ORAL | Status: DC | PRN

## 2016-10-25 MED ORDER — SODIUM CHLORIDE 0.9 % IV BOLUS (SEPSIS)
250.0000 mL | Freq: Once | INTRAVENOUS | Status: AC
  Administered 2016-10-25: 250 mL via INTRAVENOUS

## 2016-10-25 MED ORDER — SODIUM CHLORIDE 0.9 % IV SOLN
INTRAVENOUS | Status: DC
  Administered 2016-10-25: 10:00:00 via INTRAVENOUS

## 2016-10-25 MED ORDER — FENTANYL CITRATE (PF) 100 MCG/2ML IJ SOLN
25.0000 ug | INTRAMUSCULAR | Status: DC | PRN
  Administered 2016-10-25 – 2016-10-26 (×5): 25 ug via INTRAVENOUS
  Filled 2016-10-25 (×5): qty 2

## 2016-10-25 MED ORDER — SODIUM CHLORIDE 0.9 % IV SOLN
INTRAVENOUS | Status: AC
  Administered 2016-10-25: 10:00:00 via INTRAVENOUS

## 2016-10-25 MED ORDER — INSULIN GLARGINE 100 UNIT/ML ~~LOC~~ SOLN
10.0000 [IU] | Freq: Once | SUBCUTANEOUS | Status: DC
  Administered 2016-10-25: 10 [IU] via SUBCUTANEOUS
  Filled 2016-10-25: qty 0.1

## 2016-10-25 MED ORDER — DEXTROSE-NACL 5-0.45 % IV SOLN
INTRAVENOUS | Status: DC
  Administered 2016-10-25: 12:00:00 via INTRAVENOUS

## 2016-10-25 MED ORDER — SODIUM CHLORIDE 0.9 % IV SOLN
INTRAVENOUS | Status: DC
  Administered 2016-10-25: 0.4 [IU]/h via INTRAVENOUS
  Filled 2016-10-25: qty 1

## 2016-10-25 MED ORDER — METOCLOPRAMIDE HCL 5 MG/ML IJ SOLN
5.0000 mg | Freq: Four times a day (QID) | INTRAMUSCULAR | Status: DC
  Administered 2016-10-25 – 2016-10-28 (×12): 5 mg via INTRAVENOUS
  Filled 2016-10-25 (×12): qty 2

## 2016-10-25 NOTE — Progress Notes (Signed)
Called report to [place], RN, pt transferred to [id number], place to bed. Pt continues to complain of abd pain and discomfort. VS noted in the computer, CBG 158. SRP, RN

## 2016-10-25 NOTE — Progress Notes (Signed)
MD rounding and placed order to transfer pt to SD. [facility]

## 2016-10-25 NOTE — Progress Notes (Signed)
Patient heart rate sustaining in 140s after jumping briefly to 160s. MD notified and EKG ordered. Later, heart rate still sustaining 135 and MD notified. Orders given by MD for 217mL bolus and NS rate increase to 100 mL/hr. Later, pt's heart rate still sustaining 135 and MD notified with no nursing orders given.

## 2016-10-25 NOTE — Progress Notes (Signed)
Pt continue to be nauseated and c/o of ABD pain and cramping, restless in bed.  150cc of brown emesis noted. SRP, RN

## 2016-10-25 NOTE — Progress Notes (Signed)
Per Lab, pt's urine from today compared to 10/23/16 urine has a lot more budding yeast, too numerous to count, but new lab system won't flag this information. Pt needs medication addressing this, night MD notified.

## 2016-10-25 NOTE — Progress Notes (Signed)
PROGRESS NOTE    [name]  VWU:[id number] DOB: Mar 06, 1990 DOA: 10/23/2016 PCP: Vicenta Aly, FNP  Brief Narrative:  [name] is a 26 y.o. female  with past medical history of anxiety, asthma, diabetes, gastroparesis and pancreatitis who presents emergency room with chief complaint nausea and vomiting. Patient states she had acute onset of nausea vomiting around midnight. She vomitted continuously for 10 hours and was Nonbloody and nonbilious. Patient denies any diarrhea. No recent medication changes. Denies any drug use. No heavy alcohol use. She was given 3 different antiemetics without effect. Hospitalists were consulted for admission and patient being treated for her N/V and is likely from UTI, Hyperemesis Cannabinoid Syndrome, Diabetic Gastroparesis or combination of those. Complaining of whole body pain.   Assessment & Plan:   Principal Problem:   N&V (nausea and vomiting) Active Problems:   Uncontrolled type 1 diabetes mellitus (HCC)   Asthma   Hepatic steatosis   Dehydration   Abdominal pain   GERD (gastroesophageal reflux disease)   Depression with anxiety   UTI (urinary tract infection)   Leukocytosis  Intractable Nausea/Vomiting from either Hyperemesis Cannabinoid Syndrome, Diabetic Gastroparesis , also suspect mild DKA Suspect mild DKA -She was still Nauseous this AM; C/w Antiemetics with Promethazine 12.5 mg IV q6hprn for N/V and c/w Metoclopramide 5 mg IV Q6  -KUB showed No acute finding.  -CT Scan of Abd/Pelvis showed No acute intra-abdominal process. Prominent distention of the stomach and duodenum, which may be related to patient's history of gastroparesis.  Marked bladder distention. Air within the bladder is likely related to recent instrumentation. No hydronephrosis. Four hyperenhancing lesions within the liver are similar in size to prior study, previously characterized as hepatic adenomas  -UDS positive for Marijuana and Opiates -UA positive  for UTI as it showed cloudy urine, Large Leukocytes, TNTC WBC -Urine Cx pending; Patient started on IV Ceftriaxone, cloudy urine -Started on DKA protocol, IV insulin Lipase okay - Insulin Dependent Diabetes Mellitus , now mild DKA -Last HbA1c was 7.7, ABG shows pH of 7.3, PCO2 is 24.5, PaO2 113 -Repeat HbA1c this hospitalization  Initiated on IV insulin per DKA protocol, Lantus and SSI discontinued    UTI -As above -C/w Ceftriaxone; Follow Urine Cx.  Asthma -C/w Albuterol 3 mL IH q6hprn for Wheezing/SOB  Hepeatic Steatosis -Continue to Monitor -LFT's normal on Admission  GERD -Currently no Symptoms -Add Pantoprazole 40 mg IV while Nauseous and Vomiting   Depression with Anxiety -Denies SI/HI -Continue to Monitor   Leukocytosis -Likely reactive from UTI vs. N/V -WBC went from 12.2 -> 7.8 -Repeat CBC in AM  DVT prophylaxis: Heparin 5,000 sq q8h Code Status: FULL CODE Family Communication: No family present at bedside Disposition Plan: Transfer to stepdown  Consultants:   None   Procedures: None  Antimicrobials:  Anti-infectives    Start     Dose/Rate Route Frequency Ordered Stop   10/23/16 1800  cefTRIAXone (ROCEPHIN) 1 g in dextrose 5 % 50 mL IVPB     1 g 100 mL/hr over 30 Minutes Intravenous Every 24 hours 10/23/16 1716       Subjective: Complaining of generalized aches and pains,. Extremely restless, requesting narcotic pain relief  Objective: Vitals:   10/24/16 2222 10/24/16 2357 10/25/16 0246 10/25/16 0428  BP: (!) 151/88   (!) 143/99  Pulse: (!) 141 (!) 136 (!) 135 (!) 145  Resp: 20   18  Temp: 98.6 F (37 C)   98.8 F (37.1 C)  TempSrc: Oral  Oral  SpO2: 100%   100%  Weight:      Height:        Intake/Output Summary (Last 24 hours) at 10/25/16 1037 Last data filed at 10/25/16 0648  Gross per 24 hour  Intake          1013.33 ml  Output              102 ml  Net           911.33 ml   Filed Weights   10/23/16 0528 10/23/16 1613    Weight: 56.7 kg (125 lb) 55.9 kg (123 lb 3.8 oz)   Examination: Physical Exam:  Constitutional: Thin AAF in  Some mild distress and appears calm but uncomfortable Eyes: Lids and conjunctivae normal, sclerae anicteric  ENMT: External Ears, Nose appear normal. Grossly normal hearing. Mucous membranes are moist. Neck: Appears normal, supple, no cervical masses, normal ROM, no appreciable thyromegaly, no JVD Respiratory: Clear to auscultation bilaterally, no wheezing, rales, rhonchi or crackles. Normal respiratory effort and patient is not tachypenic. Cardiovascular: Tachycardic rate but regular rhythm, no murmurs / rubs / gallops. S1 and S2 auscultated. No extremity edema.  Abdomen: Soft, non-tender, non-distended. No masses palpated. No appreciable hepatosplenomegaly. Bowel sounds positive x4  GU: Deferred. Musculoskeletal: No clubbing / cyanosis of digits/nails. No joint deformity upper and lower extremities. Good ROM, no contractures. Skin: No rashes, lesions, ulcers on a limited skin eval. No induration; Warm and dry.  Neurologic: CN 2-12 grossly intact with no focal deficits. Romberg sign cerebellar reflexes not assessed.  Psychiatric: Normal judgment and insight. Alert and oriented x 3. Normal mood and flat affect.   Data Reviewed: I have personally reviewed following labs and imaging studies  CBC:  Recent Labs Lab 10/23/16 0802 10/24/16 0439 10/25/16 0422  WBC 12.2* 7.8 9.0  NEUTROABS  --   --  6.9  HGB 12.4 12.0 12.7  HCT 37.3 36.6 39.2  MCV 85.7 87.4 87.5  PLT 425* 387 242*   Basic Metabolic Panel:  Recent Labs Lab 10/23/16 0802 10/24/16 0439 10/25/16 0422  NA 138 137 137  K 3.9 3.6 4.0  CL 102 109 109  CO2 21* 19* 12*  GLUCOSE 439* 203* 242*  BUN 17 14 13   CREATININE 0.74 0.51 0.68  CALCIUM 9.2 8.2* 8.9  MG  --   --  1.8  PHOS  --   --  2.8   GFR: Estimated Creatinine Clearance: 92 mL/min (by C-G formula based on SCr of 0.68 mg/dL). Liver Function  Tests:  Recent Labs Lab 10/23/16 0802 10/25/16 0422  AST 24 12*  ALT 13* 12*  ALKPHOS 126 116  BILITOT 0.9 1.0  PROT 7.9 7.4  ALBUMIN 4.2 3.9    Recent Labs Lab 10/23/16 0802 10/25/16 0422  LIPASE 17 14   No results for input(s): AMMONIA in the last 168 hours. Coagulation Profile: No results for input(s): INR, PROTIME in the last 168 hours. Cardiac Enzymes: No results for input(s): CKTOTAL, CKMB, CKMBINDEX, TROPONINI in the last 168 hours. BNP (last 3 results) No results for input(s): PROBNP in the last 8760 hours. HbA1C: No results for input(s): HGBA1C in the last 72 hours. CBG:  Recent Labs Lab 10/24/16 2044 10/25/16 0048 10/25/16 0427 10/25/16 0726 10/25/16 1023  GLUCAP 210* 223* 237* 207* 158*   Lipid Profile: No results for input(s): CHOL, HDL, LDLCALC, TRIG, CHOLHDL, LDLDIRECT in the last 72 hours. Thyroid Function Tests:  Recent Labs  10/25/16 0422  TSH  0.766   Anemia Panel: No results for input(s): VITAMINB12, FOLATE, FERRITIN, TIBC, IRON, RETICCTPCT in the last 72 hours. Sepsis Labs:  Recent Labs Lab 10/23/16 0815  LATICACIDVEN 1.72    Recent Results (from the past 240 hour(s))  Culture, Urine     Status: Abnormal   Collection Time: 10/23/16  1:30 PM  Result Value Ref Range Status   Specimen Description URINE, RANDOM  Final   Special Requests NONE  Final   Culture MULTIPLE SPECIES PRESENT, SUGGEST RECOLLECTION (A)  Final   Report Status 10/24/2016 FINAL  Final     Radiology Studies: Ct Abdomen Pelvis W Contrast  Result Date: 10/23/2016 CLINICAL DATA:  Nausea and vomiting. EXAM: CT ABDOMEN AND PELVIS WITH CONTRAST TECHNIQUE: Multidetector CT imaging of the abdomen and pelvis was performed using the standard protocol following bolus administration of intravenous contrast. CONTRAST:  135mL ISOVUE-300 IOPAMIDOL (ISOVUE-300) INJECTION 61% COMPARISON:  CT abdomen pelvis dated February 20, 2016. FINDINGS: Lower chest: No acute abnormality.  Hepatobiliary: Four hyperenhancing lesions within the liver are again seen, previously characterized as hepatic adenomas. Status post cholecystectomy. No biliary dilatation. Pancreas: Unremarkable. No pancreatic ductal dilatation or surrounding inflammatory changes. Spleen: Normal in size without focal abnormality. Adrenals/Urinary Tract: Adrenal glands are unremarkable. Kidneys are normal, without renal calculi, focal lesion, or hydronephrosis. The bladder is markedly distended. Air within the bladder is likely related to recent instrumentation. Stomach/Bowel: The stomach and duodenum are prominently distended. Appendix appears normal. No evidence of bowel wall thickening, distention, or inflammatory changes. Vascular/Lymphatic: No significant vascular findings are present. Probable mixing artifact in the portal system. No enlarged abdominal or pelvic lymph nodes. Reproductive: Uterus and bilateral adnexa are unremarkable. Right corpus luteum. Other: Trace free fluid in the pelvis is likely physiologic. No pneumoperitoneum. Musculoskeletal: No acute or significant osseous findings. IMPRESSION: 1. No acute intra-abdominal process. 2. Prominent distention of the stomach and duodenum, which may be related to patient's history of gastroparesis. 3. Marked bladder distention. Air within the bladder is likely related to recent instrumentation. No hydronephrosis. 4. Four hyperenhancing lesions within the liver are similar in size to prior study, previously characterized as hepatic adenomas. Electronically Signed   By: Titus Dubin M.D.   On: 10/23/2016 20:26   Dg Abd Acute W/chest  Result Date: 10/23/2016 CLINICAL DATA:  Nausea and vomiting since this morning. History of gastroparesis. EXAM: DG ABDOMEN ACUTE W/ 1V CHEST COMPARISON:  07/23/2016 FINDINGS: Bowel gas pattern is within normal limits. Relatively gasless abdomen consistent with the history of vomiting. Surgical clips related to previous cholecystectomy. No  free air. One-view chest is normal. IMPRESSION: No acute finding. Relatively gasless abdomen consistent with the clinical history of vomiting. Electronically Signed   By: Nelson Chimes M.D.   On: 10/23/2016 14:12   Scheduled Meds: . cycloSPORINE  1 drop Both Eyes Q12H  . dicyclomine  20 mg Oral BID  . heparin  5,000 Units Subcutaneous Q8H  . pantoprazole (PROTONIX) IV  40 mg Intravenous Q24H  . sodium chloride flush  3 mL Intravenous Q12H   Continuous Infusions: . sodium chloride 999 mL/hr at 10/25/16 0945  . cefTRIAXone (ROCEPHIN)  IV Stopped (10/24/16 1823)  . dextrose 5 % and 0.45% NaCl    . insulin (NOVOLIN-R) infusion    . potassium chloride 10 mEq (10/25/16 1032)     LOS: 1 day   Reyne Dumas, DO [facility] Pager [phone]  If 7PM-7AM, please contact night-coverage [email or web address] Password [email or web address] 10/25/2016, 10:37 AM

## 2016-10-26 LAB — GLUCOSE, CAPILLARY
GLUCOSE-CAPILLARY: 149 mg/dL — AB (ref 65–99)
GLUCOSE-CAPILLARY: 153 mg/dL — AB (ref 65–99)
GLUCOSE-CAPILLARY: 161 mg/dL — AB (ref 65–99)
GLUCOSE-CAPILLARY: 175 mg/dL — AB (ref 65–99)
GLUCOSE-CAPILLARY: 176 mg/dL — AB (ref 65–99)
GLUCOSE-CAPILLARY: 198 mg/dL — AB (ref 65–99)
Glucose-Capillary: 183 mg/dL — ABNORMAL HIGH (ref 65–99)
Glucose-Capillary: 157 mg/dL — ABNORMAL HIGH (ref 65–99)
Glucose-Capillary: 131 mg/dL — ABNORMAL HIGH (ref 65–99)
Glucose-Capillary: 143 mg/dL — ABNORMAL HIGH (ref 65–99)
Glucose-Capillary: 120 mg/dL — ABNORMAL HIGH (ref 65–99)
Glucose-Capillary: 143 mg/dL — ABNORMAL HIGH (ref 65–99)
Glucose-Capillary: 181 mg/dL — ABNORMAL HIGH (ref 65–99)
Glucose-Capillary: 175 mg/dL — ABNORMAL HIGH (ref 65–99)
Glucose-Capillary: 169 mg/dL — ABNORMAL HIGH (ref 65–99)
Glucose-Capillary: 173 mg/dL — ABNORMAL HIGH (ref 65–99)
Glucose-Capillary: 152 mg/dL — ABNORMAL HIGH (ref 65–99)
Glucose-Capillary: 151 mg/dL — ABNORMAL HIGH (ref 65–99)

## 2016-10-26 LAB — COMPREHENSIVE METABOLIC PANEL
ALBUMIN: 3.6 g/dL (ref 3.5–5.0)
ALK PHOS: 108 U/L (ref 38–126)
ALT: 10 U/L — AB (ref 14–54)
ANION GAP: 8 (ref 5–15)
AST: 15 U/L (ref 15–41)
BILIRUBIN TOTAL: 0.6 mg/dL (ref 0.3–1.2)
BUN: 11 mg/dL (ref 6–20)
CALCIUM: 8.7 mg/dL — AB (ref 8.9–10.3)
CO2: 20 mmol/L — AB (ref 22–32)
Chloride: 107 mmol/L (ref 101–111)
Creatinine, Ser: 0.47 mg/dL (ref 0.44–1.00)
GFR calc Af Amer: >60 mL/min (ref >60)
GFR calc non Af Amer: >60 mL/min (ref >60)
GLUCOSE: 181 mg/dL — AB (ref 65–99)
Potassium: 3.1 mmol/L — ABNORMAL LOW (ref 3.5–5.1)
SODIUM: 135 mmol/L (ref 135–145)
TOTAL PROTEIN: 7.1 g/dL (ref 6.5–8.1)

## 2016-10-26 LAB — BASIC METABOLIC PANEL
ANION GAP: 10 (ref 5–15)
BUN: 8 mg/dL (ref 6–20)
CHLORIDE: 106 mmol/L (ref 101–111)
CO2: 19 mmol/L — AB (ref 22–32)
CREATININE: 0.5 mg/dL (ref 0.44–1.00)
Calcium: 8.3 mg/dL — ABNORMAL LOW (ref 8.9–10.3)
GFR calc non Af Amer: >60 mL/min (ref >60)
Glucose, Bld: 144 mg/dL — ABNORMAL HIGH (ref 65–99)
POTASSIUM: 3.5 mmol/L (ref 3.5–5.1)
Sodium: 135 mmol/L (ref 135–145)

## 2016-10-26 LAB — CBC
HEMATOCRIT: 37.3 % (ref 36.0–46.0)
HEMOGLOBIN: 12.4 g/dL (ref 12.0–15.0)
MCH: 28.2 pg (ref 26.0–34.0)
MCHC: 33.2 g/dL (ref 30.0–36.0)
MCV: 84.8 fL (ref 78.0–100.0)
Platelets: 380 10*3/uL (ref 150–400)
RBC: 4.4 MIL/uL (ref 3.87–5.11)
RDW: 14.1 % (ref 11.5–15.5)
WBC: 6.9 10*3/uL (ref 4.0–10.5)

## 2016-10-26 LAB — LIPID PANEL
CHOL/HDL RATIO: 2.3 ratio
Cholesterol: 121 mg/dL (ref 0–200)
HDL: 53 mg/dL (ref >40)
LDL CALC: 42 mg/dL (ref 0–99)
TRIGLYCERIDES: 130 mg/dL (ref <150)
VLDL: 26 mg/dL (ref 0–40)

## 2016-10-26 MED ORDER — INSULIN REGULAR HUMAN 100 UNIT/ML IJ SOLN
INTRAMUSCULAR | Status: DC
  Filled 2016-10-26: qty 1

## 2016-10-26 MED ORDER — FLUCONAZOLE IN SODIUM CHLORIDE 200-0.9 MG/100ML-% IV SOLN
200.0000 mg | INTRAVENOUS | Status: AC
  Administered 2016-10-26 – 2016-10-28 (×3): 200 mg via INTRAVENOUS
  Filled 2016-10-26 (×3): qty 100

## 2016-10-26 MED ORDER — SODIUM CHLORIDE 0.9 % IV SOLN: INTRAVENOUS | Status: DC

## 2016-10-26 MED ORDER — INSULIN ASPART 100 UNIT/ML ~~LOC~~ SOLN
0.0000 [IU] | Freq: Three times a day (TID) | SUBCUTANEOUS | Status: DC
  Administered 2016-10-26: 2 [IU] via SUBCUTANEOUS
  Administered 2016-10-27: 5 [IU] via SUBCUTANEOUS
  Administered 2016-10-27 – 2016-10-28 (×2): 2 [IU] via SUBCUTANEOUS
  Administered 2016-10-28: 3 [IU] via SUBCUTANEOUS
  Administered 2016-10-28: 1 [IU] via SUBCUTANEOUS
  Administered 2016-10-29: 5 [IU] via SUBCUTANEOUS
  Administered 2016-10-29 (×2): 3 [IU] via SUBCUTANEOUS

## 2016-10-26 MED ORDER — OXYBUTYNIN CHLORIDE 5 MG PO TABS
2.5000 mg | ORAL_TABLET | Freq: Three times a day (TID) | ORAL | Status: DC
  Administered 2016-10-26 – 2016-10-29 (×9): 2.5 mg via ORAL
  Filled 2016-10-26 (×9): qty 1

## 2016-10-26 MED ORDER — INSULIN GLARGINE 100 UNIT/ML ~~LOC~~ SOLN
25.0000 [IU] | Freq: Every day | SUBCUTANEOUS | Status: DC
  Administered 2016-10-26 – 2016-10-27 (×2): 25 [IU] via SUBCUTANEOUS
  Filled 2016-10-26 (×2): qty 0.25

## 2016-10-26 MED ORDER — POTASSIUM CHLORIDE IN NACL 40-0.9 MEQ/L-% IV SOLN
INTRAVENOUS | Status: DC
  Administered 2016-10-26 – 2016-10-28 (×3): 100 mL/h via INTRAVENOUS
  Filled 2016-10-26 (×7): qty 1000

## 2016-10-26 MED ORDER — MORPHINE SULFATE (PF) 4 MG/ML IV SOLN
1.0000 mg | INTRAVENOUS | Status: DC | PRN
  Administered 2016-10-26 – 2016-10-28 (×11): 2 mg via INTRAVENOUS
  Filled 2016-10-26 (×11): qty 1

## 2016-10-26 NOTE — Progress Notes (Addendum)
PROGRESS NOTE    [name]  OEU:[id number] DOB: [birth year] DOA: 10/23/2016 PCP: Vicenta Aly, FNP  Brief Narrative:  [name] is a 26 y.o. female  with past medical history of anxiety, asthma, diabetes, gastroparesis and pancreatitis who presents emergency room with chief complaint nausea and vomiting. Patient states she had acute onset of nausea vomiting around midnight. She vomitted continuously for 10 hours and was Nonbloody and nonbilious. Patient denies any diarrhea. No recent medication changes. Denies any drug use. No heavy alcohol use. She was given 3 different antiemetics without effect. Hospitalists were consulted for admission and patient being treated for her N/V and is likely from UTI, Hyperemesis Cannabinoid Syndrome, Diabetic Gastroparesis or combination of those. Complaining of whole body pain.   Assessment & Plan:   Principal Problem:   N&V (nausea and vomiting) Active Problems:   Uncontrolled type 1 diabetes mellitus (HCC)   Asthma   Hepatic steatosis   Dehydration   Abdominal pain   GERD (gastroesophageal reflux disease)   Depression with anxiety   UTI (urinary tract infection)   Leukocytosis  Intractable Nausea/Vomiting from either Hyperemesis Cannabinoid Syndrome, Diabetic Gastroparesis , UTI,   DKA -She was still Nauseous this AM; C/w Antiemetics with Promethazine 12.5 mg IV q6hprn for N/V and c/w Metoclopramide 5 mg IV Q6  -KUB showed No acute finding.  -CT Scan of Abd/Pelvis showed No acute intra-abdominal process. Prominent distention of the stomach and duodenum, which may be related to patient's history of gastroparesis.  Marked bladder distention. Air within the bladder is likely related to recent instrumentation. No hydronephrosis. Four hyperenhancing lesions within the liver are similar in size to prior study, previously characterized as hepatic adenomas  -UDS positive for Marijuana and Opiates -UA positive for UTI as it showed cloudy  urine, Large Leukocytes, TNTC WBC -Urine Cx pending;  continue Rocephin pending further results, -Started on DKA protocol, IV insulin Lipase okay   DKA No labs since  2100 Continue DKA protocol     Insulin Dependent Diabetes Mellitus , now mild DKA -Last HbA1c was 7.7, ABG shows pH of 7.3, PCO2 is 24.5, PaO2 113 -Repeat HbA1c this hospitalization  Initiated on IV insulin per DKA protocol, Lantus and SSI will be restarted as DKA seems to be resolving    UTI-possibly fungal UTI, Started on fluconazole, continue ceftriaxone 24-hour Urine protein , rule out proteinuria given milky urine Also check lipid panel to rule out  Lipiduria   Asthma -C/w Albuterol 3 mL IH q6hprn for Wheezing/SOB  Hepeatic Steatosis -Continue to Monitor -LFT's normal on Admission  GERD -Currently no Symptoms -Add Pantoprazole 40 mg IV while Nauseous and Vomiting   Depression with Anxiety -Denies SI/HI -Continue to Monitor   Leukocytosis -Likely reactive from UTI vs. N/V -WBC went from 12.2 -> 7.8 -Repeat CBC in AM    DVT prophylaxis: Heparin 5,000 sq q8h Code Status: FULL CODE Family Communication: No family present at bedside Disposition Plan:  Continue stepdown pending improvement  Consultants:   None   Procedures: None  Antimicrobials:  Anti-infectives    Start     Dose/Rate Route Frequency Ordered Stop   10/26/16 0900  fluconazole (DIFLUCAN) IVPB 200 mg     200 mg 100 mL/hr over 60 Minutes Intravenous Every 24 hours 10/26/16 0847     10/23/16 1800  cefTRIAXone (ROCEPHIN) 1 g in dextrose 5 % 50 mL IVPB     1 g 100 mL/hr over 30 Minutes Intravenous Every 24 hours 10/23/16 1716  Subjective: Does well after she receives phenegan and fentanyl, with any delay HR goes up and she starts vomitting , she still has milky urine   Objective: Vitals:   10/25/16 2000 10/26/16 0004 10/26/16 0353 10/26/16 0800  BP: (!) 155/97     Pulse:      Resp: 14     Temp:  98.8 F (37.1  C) 99.4 F (37.4 C) 98.4 F (36.9 C)  TempSrc:  Oral Oral Oral  SpO2: 100%     Weight:      Height:        Intake/Output Summary (Last 24 hours) at 10/26/16 0849 Last data filed at 10/26/16 0600  Gross per 24 hour  Intake          1050.08 ml  Output             2000 ml  Net          -949.92 ml   Filed Weights   10/23/16 0528 10/23/16 1613 10/25/16 1128  Weight: 56.7 kg (125 lb) 55.9 kg (123 lb 3.8 oz) 55.8 kg (123 lb 0.3 oz)   Examination: Physical Exam:  Constitutional: Thin AAF in  Some mild distress and appears calm but uncomfortable Eyes: Lids and conjunctivae normal, sclerae anicteric  ENMT: External Ears, Nose appear normal. Grossly normal hearing. Mucous membranes are moist. Neck: Appears normal, supple, no cervical masses, normal ROM, no appreciable thyromegaly, no JVD Respiratory: Clear to auscultation bilaterally, no wheezing, rales, rhonchi or crackles. Normal respiratory effort and patient is not tachypenic. Cardiovascular: Tachycardic rate but regular rhythm, no murmurs / rubs / gallops. S1 and S2 auscultated. No extremity edema.  Abdomen: Soft, non-tender, non-distended. No masses palpated. No appreciable hepatosplenomegaly. Bowel sounds positive x4  GU: Deferred. Musculoskeletal: No clubbing / cyanosis of digits/nails. No joint deformity upper and lower extremities. Good ROM, no contractures. Skin: No rashes, lesions, ulcers on a limited skin eval. No induration; Warm and dry.  Neurologic: CN 2-12 grossly intact with no focal deficits. Romberg sign cerebellar reflexes not assessed.  Psychiatric: Normal judgment and insight. Alert and oriented x 3. Normal mood and flat affect.   Data Reviewed: I have personally reviewed following labs and imaging studies  CBC:  Recent Labs Lab 10/23/16 0802 10/24/16 0439 10/25/16 0422  WBC 12.2* 7.8 9.0  NEUTROABS  --   --  6.9  HGB 12.4 12.0 12.7  HCT 37.3 36.6 39.2  MCV 85.7 87.4 87.5  PLT 425* 387 409*   Basic  Metabolic Panel:  Recent Labs Lab 10/24/16 0439 10/25/16 0422 10/25/16 1027 10/25/16 1357 10/25/16 2108  NA 137 137 140 137 137  K 3.6 4.0 5.0 3.8 3.5  CL 109 109 114* 111 107  CO2 19* 12* 12* 14* 15*  GLUCOSE 203* 242* 145* 142* 179*  BUN 14 13 14 13 13   CREATININE 0.51 0.68 0.67 0.57 0.61  CALCIUM 8.2* 8.9 8.9 8.6* 9.2  MG  --  1.8 1.9  --   --   PHOS  --  2.8  --   --   --    GFR: Estimated Creatinine Clearance: 91.2 mL/min (by C-G formula based on SCr of 0.61 mg/dL). Liver Function Tests:  Recent Labs Lab 10/23/16 0802 10/25/16 0422  AST 24 12*  ALT 13* 12*  ALKPHOS 126 116  BILITOT 0.9 1.0  PROT 7.9 7.4  ALBUMIN 4.2 3.9    Recent Labs Lab 10/23/16 0802 10/25/16 0422 10/25/16 2107  LIPASE 17 14 17  No results for input(s): AMMONIA in the last 168 hours. Coagulation Profile: No results for input(s): INR, PROTIME in the last 168 hours. Cardiac Enzymes: No results for input(s): CKTOTAL, CKMB, CKMBINDEX, TROPONINI in the last 168 hours. BNP (last 3 results) No results for input(s): PROBNP in the last 8760 hours. HbA1C: No results for input(s): HGBA1C in the last 72 hours. CBG:  Recent Labs Lab 10/26/16 0200 10/26/16 0258 10/26/16 0346 10/26/16 0451 10/26/16 0802  GLUCAP 198* 176* 143* 120* 175*   Lipid Profile: No results for input(s): CHOL, HDL, LDLCALC, TRIG, CHOLHDL, LDLDIRECT in the last 72 hours. Thyroid Function Tests:  Recent Labs  10/25/16 0422  TSH 0.766   Anemia Panel: No results for input(s): VITAMINB12, FOLATE, FERRITIN, TIBC, IRON, RETICCTPCT in the last 72 hours. Sepsis Labs:  Recent Labs Lab 10/23/16 0815  LATICACIDVEN 1.72    Recent Results (from the past 240 hour(s))  Culture, Urine     Status: Abnormal   Collection Time: 10/23/16  1:30 PM  Result Value Ref Range Status   Specimen Description URINE, RANDOM  Final   Special Requests NONE  Final   Culture MULTIPLE SPECIES PRESENT, SUGGEST RECOLLECTION (A)  Final     Report Status 10/24/2016 FINAL  Final     Radiology Studies: No results found. Scheduled Meds: . cycloSPORINE  1 drop Both Eyes Q12H  . dicyclomine  20 mg Oral BID  . heparin  5,000 Units Subcutaneous Q8H  . metoCLOPramide (REGLAN) injection  5 mg Intravenous Q6H  . oxybutynin  2.5 mg Oral TID  . pantoprazole (PROTONIX) IV  40 mg Intravenous Q24H  . sodium chloride flush  3 mL Intravenous Q12H   Continuous Infusions: . cefTRIAXone (ROCEPHIN)  IV Stopped (10/25/16 1819)  . dextrose 5 % and 0.45% NaCl 100 mL/hr at 10/25/16 1134  . fluconazole (DIFLUCAN) IV    . insulin (NOVOLIN-R) infusion 0.4 Units/hr (10/25/16 1153)     LOS: 2 days   Reyne Dumas, DO [facility] Pager [phone]  If 7PM-7AM, please contact night-coverage [email or web address] Password [email or web address] 10/26/2016, 8:49 AM

## 2016-10-26 NOTE — Care Management Note (Signed)
Case Management Note  Patient Details  Name: [name] MRN: [id number] Date of Birth: 1990-11-13  Subjective/Objective:                  Dka, gastroporesis,pancreatitis  Action/Plan: Date:  October 26, 2016 Chart reviewed for concurrent status and case management needs.  Will continue to follow patient progress.  Discharge Planning: following for needs  Expected discharge date: [id number]  Velva Harman, BSN, [place], [place]   Expected Discharge Date:   (unknown)               Expected Discharge Plan:  Home/Self Care  In-House Referral:     Discharge planning Services  CM Consult  Post Acute Care Choice:    Choice offered to:     DME Arranged:    DME Agency:     HH Arranged:    [place] Agency:     Status of Service:  In process, will continue to follow  If discussed at Long Length of Stay Meetings, dates discussed:    Additional Comments:  Leeroy Cha, RN 10/26/2016, 9:10 AM

## 2016-10-27 DIAGNOSIS — Z91018 Allergy to other foods: Secondary | ICD-10-CM

## 2016-10-27 DIAGNOSIS — Z8349 Family history of other endocrine, nutritional and metabolic diseases: Secondary | ICD-10-CM

## 2016-10-27 DIAGNOSIS — Z794 Long term (current) use of insulin: Secondary | ICD-10-CM

## 2016-10-27 DIAGNOSIS — Z833 Family history of diabetes mellitus: Secondary | ICD-10-CM

## 2016-10-27 DIAGNOSIS — Z888 Allergy status to other drugs, medicaments and biological substances status: Secondary | ICD-10-CM

## 2016-10-27 DIAGNOSIS — Z809 Family history of malignant neoplasm, unspecified: Secondary | ICD-10-CM

## 2016-10-27 DIAGNOSIS — Z8719 Personal history of other diseases of the digestive system: Secondary | ICD-10-CM

## 2016-10-27 DIAGNOSIS — Z91010 Allergy to peanuts: Secondary | ICD-10-CM

## 2016-10-27 DIAGNOSIS — E1165 Type 2 diabetes mellitus with hyperglycemia: Secondary | ICD-10-CM

## 2016-10-27 LAB — BASIC METABOLIC PANEL
ANION GAP: 12 (ref 5–15)
Anion gap: 10 (ref 5–15)
Anion gap: 13 (ref 5–15)
BUN: 8 mg/dL (ref 6–20)
BUN: 9 mg/dL (ref 6–20)
BUN: 9 mg/dL (ref 6–20)
CALCIUM: 8.6 mg/dL — AB (ref 8.9–10.3)
CALCIUM: 8.8 mg/dL — AB (ref 8.9–10.3)
CALCIUM: 8.8 mg/dL — AB (ref 8.9–10.3)
CO2: 19 mmol/L — AB (ref 22–32)
CO2: 20 mmol/L — AB (ref 22–32)
CO2: 14 mmol/L — AB (ref 22–32)
CREATININE: 0.5 mg/dL (ref 0.44–1.00)
CREATININE: 0.56 mg/dL (ref 0.44–1.00)
CREATININE: 0.73 mg/dL (ref 0.44–1.00)
Chloride: 105 mmol/L (ref 101–111)
Chloride: 107 mmol/L (ref 101–111)
Chloride: 110 mmol/L (ref 101–111)
GFR calc Af Amer: >60 mL/min (ref >60)
GFR calc Af Amer: >60 mL/min (ref >60)
GFR calc non Af Amer: >60 mL/min (ref >60)
GFR calc non Af Amer: >60 mL/min (ref >60)
GLUCOSE: 68 mg/dL (ref 65–99)
GLUCOSE: 186 mg/dL — AB (ref 65–99)
GLUCOSE: 245 mg/dL — AB (ref 65–99)
Potassium: 3.4 mmol/L — ABNORMAL LOW (ref 3.5–5.1)
Potassium: 3.5 mmol/L (ref 3.5–5.1)
Potassium: 4.4 mmol/L (ref 3.5–5.1)
Sodium: 136 mmol/L (ref 135–145)
Sodium: 136 mmol/L (ref 135–145)
Sodium: 138 mmol/L (ref 135–145)

## 2016-10-27 LAB — GLUCOSE, CAPILLARY
GLUCOSE-CAPILLARY: 252 mg/dL — AB (ref 65–99)
GLUCOSE-CAPILLARY: 153 mg/dL — AB (ref 65–99)
Glucose-Capillary: 58 mg/dL — ABNORMAL LOW (ref 65–99)
Glucose-Capillary: 170 mg/dL — ABNORMAL HIGH (ref 65–99)
Glucose-Capillary: 112 mg/dL — ABNORMAL HIGH (ref 65–99)

## 2016-10-27 LAB — PROTEIN, URINE, 24 HOUR
COLLECTION INTERVAL-UPROT: 24 h
Protein, 24H Urine: 698 mg/d — ABNORMAL HIGH (ref 50–100)
Protein, Urine: 49 mg/dL
URINE TOTAL VOLUME-UPROT: 1425 mL

## 2016-10-27 LAB — HIV ANTIBODY (ROUTINE TESTING W REFLEX): HIV Screen 4th Generation wRfx: NONREACTIVE

## 2016-10-27 MED ORDER — DEXTROSE 50 % IV SOLN
INTRAVENOUS | Status: AC
  Administered 2016-10-27: 50 mL
  Filled 2016-10-27: qty 50

## 2016-10-27 MED ORDER — INSULIN GLARGINE 100 UNIT/ML ~~LOC~~ SOLN
15.0000 [IU] | Freq: Every day | SUBCUTANEOUS | Status: DC
  Administered 2016-10-28: 15 [IU] via SUBCUTANEOUS
  Filled 2016-10-27 (×2): qty 0.15

## 2016-10-27 NOTE — Progress Notes (Signed)
PROGRESS NOTE    [name]  NLG:[id number] DOB: [birth year] DOA: 10/23/2016 PCP: Vicenta Aly, FNP  Brief Narrative:  [name] is a 26 y.o. female  with past medical history of anxiety, asthma, diabetes, gastroparesis and pancreatitis who presents emergency room with chief complaint nausea and vomiting. Patient states she had acute onset of nausea vomiting around midnight. She vomitted continuously for 10 hours and was Nonbloody and nonbilious. Patient denies any diarrhea. No recent medication changes. Denies any drug use. No heavy alcohol use. She was given 3 different antiemetics without effect. Hospitalists were consulted for admission and patient being treated for her N/V and is likely from UTI, Hyperemesis Cannabinoid Syndrome, Diabetic Gastroparesis or combination of those.  TX to stepdown 10/7 for DKA , placed on insulin gtt , transferred out yesterday . Symptomatically improved   Assessment & Plan:   Principal Problem:   N&V (nausea and vomiting) Active Problems:   Uncontrolled type 1 diabetes mellitus (HCC)   Asthma   Hepatic steatosis   Dehydration   Abdominal pain   GERD (gastroesophageal reflux disease)   Depression with anxiety   UTI (urinary tract infection)   Leukocytosis    Intractable Nausea/Vomiting from either Hyperemesis Cannabinoid Syndrome, Diabetic Gastroparesis , UTI, DKA Patient initially presented with intractable nausea vomiting, treated with Antiemetics with Promethazine 12.5 mg IV q6hprn for N/V and c/w Metoclopramide 5 mg IV Q6  -KUB showed No acute finding.  -CT Scan of Abd/Pelvis showed No acute intra-abdominal process. Prominent distention of the stomach and duodenum, which may be related to patient's history of gastroparesis.  Marked bladder distention. Air within the bladder is likely related to recent instrumentation. No hydronephrosis. Four hyperenhancing lesions within the liver are similar in size to prior study, previously  characterized as hepatic adenomas  -UDS positive for Marijuana and Opiates -UA positive for UTI as it showed cloudy urine, Large Leukocytes, TNTC WBC -Started on DKA protocol, IV insulin, patient is symptomatically improved Transferred out of stepdown yesterday    DKA-resolved, symptomatically patient has improved Continue Lantus, sliding scale insulin, patient's endocrinologist Dr. Hartford Poli has already switched patient to [name] 10 units daily. Patient will be discharged on Lantus, will follow-up with endocrinology, and discuss further treatment options, given DKA this admission Follow bicarbonate and anion gap closely   Insulin Dependent Diabetes Mellitus , now mild DKA -Last HbA1c was 7.7, ABG shows pH of 7.3, PCO2 is 24.5, PaO2 113 -Repeat HbA1c this hospitalization  Initiated on IV insulin per DKA protocol, Lantus and SSI will be restarted as DKA seems to be resolving    UTI-possibly fungal UTI, Started on fluconazole 10/8, symptoms of UTI have improved since initiation of fluconazole  Infectious disease has discontinued Rocephin 24-hour Urine protein , rule out proteinuria given milky urine Lipid panel does not show hypertriglyceridemia, rules out lipiduria Milky urine, now clearing up with fluconazole  Asthma -C/w Albuterol 3 mL IH q6hprn for Wheezing/SOB  Hepeatic Steatosis -Continue to Monitor -LFT's normal on Admission  GERD -Currently no Symptoms Continue Pantoprazole 40 mg IV while Nauseous and Vomiting   Depression with Anxiety -Denies SI/HI -Continue to Monitor   Leukocytosis -Likely reactive from UTI vs. N/V -WBC went from 12.2 -> 7.8 -Repeat CBC in AM    DVT prophylaxis: Heparin 5,000 sq q8h Code Status: FULL CODE Family Communication: No family present at bedside Disposition Plan:  Continue telemetry, anticipate discharge tomorrow  Consultants:   None   Procedures: None  Antimicrobials:  Anti-infectives  Start     Dose/Rate  Route Frequency Ordered Stop   10/26/16 0900  fluconazole (DIFLUCAN) IVPB 200 mg     200 mg 100 mL/hr over 60 Minutes Intravenous Every 24 hours 10/26/16 0847 10/29/16 0859   10/23/16 1800  cefTRIAXone (ROCEPHIN) 1 g in dextrose 5 % 50 mL IVPB  Status:  Discontinued     1 g 100 mL/hr over 30 Minutes Intravenous Every 24 hours 10/23/16 1716 10/27/16 1414     Subjective: Patient denies any nausea vomiting, has received Phenergan only once today. Patient still has a very poor appetite. Otherwise  significantly improved    Objective: Vitals:   10/26/16 2306 10/27/16 0100 10/27/16 0448 10/27/16 1400  BP:  (!) 157/97 (!) 147/96 112/82  Pulse:  (!) 132 (!) 128 (!) 116  Resp:  16 16 18   Temp: 98.8 F (37.1 C) 98.7 F (37.1 C) 98.7 F (37.1 C) 99 F (37.2 C)  TempSrc:  Oral Oral Oral  SpO2:  99% 99% 99%  Weight:      Height:        Intake/Output Summary (Last 24 hours) at 10/27/16 1654 Last data filed at 10/27/16 1500  Gross per 24 hour  Intake             2185 ml  Output              500 ml  Net             1685 ml   Filed Weights   10/23/16 0528 10/23/16 1613 10/25/16 1128  Weight: 56.7 kg (125 lb) 55.9 kg (123 lb 3.8 oz) 55.8 kg (123 lb 0.3 oz)   Examination: Physical Exam:  Constitutional: Thin AAF in  Some mild distress and appears calm but uncomfortable Eyes: Lids and conjunctivae normal, sclerae anicteric  ENMT: External Ears, Nose appear normal. Grossly normal hearing. Mucous membranes are moist. Neck: Appears normal, supple, no cervical masses, normal ROM, no appreciable thyromegaly, no JVD Respiratory: Clear to auscultation bilaterally, no wheezing, rales, rhonchi or crackles. Normal respiratory effort and patient is not tachypenic. Cardiovascular: Tachycardic rate but regular rhythm, no murmurs / rubs / gallops. S1 and S2 auscultated. No extremity edema.  Abdomen: Soft, non-tender, non-distended. No masses palpated. No appreciable hepatosplenomegaly. Bowel sounds  positive x4  GU: Deferred. Musculoskeletal: No clubbing / cyanosis of digits/nails. No joint deformity upper and lower extremities. Good ROM, no contractures. Skin: No rashes, lesions, ulcers on a limited skin eval. No induration; Warm and dry.  Neurologic: CN 2-12 grossly intact with no focal deficits. Romberg sign cerebellar reflexes not assessed.  Psychiatric: Normal judgment and insight. Alert and oriented x 3. Normal mood and flat affect.   Data Reviewed: I have personally reviewed following labs and imaging studies  CBC:  Recent Labs Lab 10/23/16 0802 10/24/16 0439 10/25/16 0422 10/26/16 0857  WBC 12.2* 7.8 9.0 6.9  NEUTROABS  --   --  6.9  --   HGB 12.4 12.0 12.7 12.4  HCT 37.3 36.6 39.2 37.3  MCV 85.7 87.4 87.5 84.8  PLT 425* 387 409* 858   Basic Metabolic Panel:  Recent Labs Lab 10/25/16 0422 10/25/16 1027  10/25/16 2108 10/26/16 0857 10/26/16 1530 10/27/16 0000 10/27/16 0835  NA 137 140  < > 137 135 135 138 136  K 4.0 5.0  < > 3.5 3.1* 3.5 3.4* 4.4  CL 109 114*  < > 107 107 106 105 110  CO2 12* 12*  < > 15*  20* 19* 20* 14*  GLUCOSE 242* 145*  < > 179* 181* 144* 186* 245*  BUN 13 14  < > 13 11 8 9 9   CREATININE 0.68 0.67  < > 0.61 0.47 0.50 0.56 0.73  CALCIUM 8.9 8.9  < > 9.2 8.7* 8.3* 8.8* 8.8*  MG 1.8 1.9  --   --   --   --   --   --   PHOS 2.8  --   --   --   --   --   --   --   < > = values in this interval not displayed. GFR: Estimated Creatinine Clearance: 91.2 mL/min (by C-G formula based on SCr of 0.73 mg/dL). Liver Function Tests:  Recent Labs Lab 10/23/16 0802 10/25/16 0422 10/26/16 0857  AST 24 12* 15  ALT 13* 12* 10*  ALKPHOS 126 116 108  BILITOT 0.9 1.0 0.6  PROT 7.9 7.4 7.1  ALBUMIN 4.2 3.9 3.6    Recent Labs Lab 10/23/16 0802 10/25/16 0422 10/25/16 2107  LIPASE 17 14 17    No results for input(s): AMMONIA in the last 168 hours. Coagulation Profile: No results for input(s): INR, PROTIME in the last 168 hours. Cardiac  Enzymes: No results for input(s): CKTOTAL, CKMB, CKMBINDEX, TROPONINI in the last 168 hours. BNP (last 3 results) No results for input(s): PROBNP in the last 8760 hours. HbA1C: No results for input(s): HGBA1C in the last 72 hours. CBG:  Recent Labs Lab 10/26/16 1456 10/26/16 1717 10/26/16 2108 10/27/16 0732 10/27/16 1156  GLUCAP 149* 151* 153* 252* 153*   Lipid Profile:  Recent Labs  10/26/16 0857  CHOL 121  HDL 53  LDLCALC 42  TRIG 130  CHOLHDL 2.3   Thyroid Function Tests:  Recent Labs  10/25/16 0422  TSH 0.766   Anemia Panel: No results for input(s): VITAMINB12, FOLATE, FERRITIN, TIBC, IRON, RETICCTPCT in the last 72 hours. Sepsis Labs:  Recent Labs Lab 10/23/16 0815  LATICACIDVEN 1.72    Recent Results (from the past 240 hour(s))  Culture, Urine     Status: Abnormal   Collection Time: 10/23/16  1:30 PM  Result Value Ref Range Status   Specimen Description URINE, RANDOM  Final   Special Requests NONE  Final   Culture MULTIPLE SPECIES PRESENT, SUGGEST RECOLLECTION (A)  Final   Report Status 10/24/2016 FINAL  Final     Radiology Studies: No results found. Scheduled Meds: . cycloSPORINE  1 drop Both Eyes Q12H  . dicyclomine  20 mg Oral BID  . heparin  5,000 Units Subcutaneous Q8H  . insulin aspart  0-9 Units Subcutaneous TID WC  . insulin glargine  25 Units Subcutaneous Daily  . metoCLOPramide (REGLAN) injection  5 mg Intravenous Q6H  . oxybutynin  2.5 mg Oral TID  . pantoprazole (PROTONIX) IV  40 mg Intravenous Q24H  . sodium chloride flush  3 mL Intravenous Q12H   Continuous Infusions: . 0.9 % NaCl with KCl 40 mEq / L 100 mL/hr (10/26/16 1809)  . fluconazole (DIFLUCAN) IV Stopped (10/27/16 1058)     LOS: 3 days   Reyne Dumas, DO [facility] Pager [phone]  If 7PM-7AM, please contact night-coverage [email or web address] Password [email or web address] 10/27/2016, 4:54 PM

## 2016-10-27 NOTE — Progress Notes (Signed)
Inpatient Diabetes Program Recommendations  AACE/ADA: New Consensus Statement on Inpatient Glycemic Control (2015)  Target Ranges:  Prepandial:   less than 140 mg/dL      Peak postprandial:   less than 180 mg/dL (1-2 hours)      Critically ill patients:  140 - 180 mg/dL   Lab Results  Component Value Date   GLUCAP 252 (H) 10/27/2016   HGBA1C 7.7 (H) 07/16/2016    Review of Glycemic Control  9th admission in previous 6 months. Sees Dr. Hartford Poli for her Type 1 diabetes.  Per Dr. Hartford Poli on 09/03/2016: 1. Continue Lantus Solostar 10 units daily. Switch to [name] 10 units daily when you run out of Lantus. 2. Continue Novolog Flexpen 1 unit per 10 grams carbohydrates AFTER meals/snacks plus correction scale 1 unit per 50 mg/dL above 150."   Discussed correction insulin use. Pt states she is compliant with taking meal coverage insulin AFTER meals with 1:10 CHO ratio and 1 units for every 50 mg/dL above 150 mg/dL. Pt states she is taking Lantus 10 units QHS. Denies any hypoglycemia.  Pt would benefit from OP psych consult for depression.  Continue to follow.  Thank you. Lorenda Peck, RD, LDN, CDE Inpatient Diabetes Coordinator [phone]

## 2016-10-27 NOTE — Consult Note (Signed)
[facility]       Reason for Consult: ? Urinary infection     Referring Physician: Dr. Allyson Sabal  Principal Problem:   N&V (nausea and vomiting) Active Problems:   Uncontrolled type 1 diabetes mellitus (HCC)   Asthma   Hepatic steatosis   Dehydration   Abdominal pain   GERD (gastroesophageal reflux disease)   Depression with anxiety   UTI (urinary tract infection)   Leukocytosis   . cycloSPORINE  1 drop Both Eyes Q12H  . dicyclomine  20 mg Oral BID  . heparin  5,000 Units Subcutaneous Q8H  . insulin aspart  0-9 Units Subcutaneous TID WC  . insulin glargine  25 Units Subcutaneous Daily  . metoCLOPramide (REGLAN) injection  5 mg Intravenous Q6H  . oxybutynin  2.5 mg Oral TID  . pantoprazole (PROTONIX) IV  40 mg Intravenous Q24H  . sodium chloride flush  3 mL Intravenous Q12H    Recommendations: Continue fluconazole 1 more day Stop ceftriaxone   Assessment: She has n/v typical for her multiple admissions for gastroparesis.   Her UA show WBCs with ketones, glucose and yeast concerning for a vaginal yeast infection. She though has not had any symptoms.  ? UTI -related N/V - nausea and vomiting is not a symptoms of UTIs without systemic symptoms of high fever, high WBC and pyelonephritis and I would not consider this with her presentation.  She also was asymptomatic with no dysuria, no frequency.    HIV pending  Antibiotics: Ceftriaxone day 5 Fluconazole day 2  HPI: [name] is a 26 y.o. female with type 1 diabetes and multiple admissions for gastroparesis over the last year for similar presentations who came in with n/v typical for her gastroparesis.  She feels better now. Urine drug screen positive for marijuana.  Developed DKA.  She has remained afebrile with a Tmax of 100.3, normal WBC.  She was placed initially on ceftriaxone for ? UTI but was asymptomatic with no dysuria, frequency or pyuria.  She also denies having any vaginal  discharge. No fever, no chills.  Had a CT scan and no significant findings.     Review of Systems:  Constitutional: negative for fevers, chills and fatigue Gastrointestinal: negative for diarrhea Genitourinary: negative for frequency, dysuria and genital lesions and vaginal discharge Integument/breast: negative for rash All other systems reviewed and are negative    Past Medical History:  Diagnosis Date  . Anxiety   . Arthritis   . Asthma   . Diabetes mellitus 2007   IDDM.  poorly controlled, multiple admits with DKA  . Gallstones   . Gastroparesis   . Heart murmur   . Hepatic steatosis 11/26/2014   and hepatomegaly  . Hypertension    NOT CURRENTLY ON ANY BP MED  . Liver mass 11/26/2014  . Pancreatitis, acute 11/26/2014    Social History  Substance Use Topics  . Smoking status: Never Smoker  . Smokeless tobacco: Never Used  . Alcohol use No    Family History  Problem Relation Age of Onset  . Heart disease Maternal Grandmother   . Heart disease Maternal Grandfather   . Diabetes Mother   . Hyperlipidemia Mother   . Hypertension Father   . Heart disease Father   . Hypertension Paternal Grandmother   . Cancer Paternal Grandfather     Allergies  Allergen Reactions  . Peanut-Containing Drug Products Swelling and Other (See Comments)    Reaction:  Swelling of mouth and  lips   . 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   Reaction:  Swelling of mouth and lips   . Ultram [Tramadol] Itching    Physical Exam: Constitutional: in no apparent distress and alert  Vitals:   10/27/16 0100 10/27/16 0448  BP: (!) 157/97 (!) 147/96  Pulse: (!) 132 (!) 128  Resp: 16 16  Temp: 98.7 F (37.1 C) 98.7 F (37.1 C)  SpO2: 99% 99%   EYES: anicteric ENMT: no thrush Cardiovascular: Cor tachy RR Respiratory: CTA B; normal respiratory effort GI: Bowel sounds are normal, liver is not enlarged, spleen is not enlarged Musculoskeletal: no pedal edema  noted Skin: negatives: no rash Hematologic: no cervical lad  Lab Results  Component Value Date   WBC 6.9 10/26/2016   HGB 12.4 10/26/2016   HCT 37.3 10/26/2016   MCV 84.8 10/26/2016   PLT 380 10/26/2016    Lab Results  Component Value Date   CREATININE 0.73 10/27/2016   BUN 9 10/27/2016   NA 136 10/27/2016   K 4.4 10/27/2016   CL 110 10/27/2016   CO2 14 (L) 10/27/2016    Lab Results  Component Value Date   ALT 10 (L) 10/26/2016   AST 15 10/26/2016   ALKPHOS 108 10/26/2016     Microbiology: Recent Results (from the past 240 hour(s))  Culture, Urine     Status: Abnormal   Collection Time: 10/23/16  1:30 PM  Result Value Ref Range Status   Specimen Description URINE, RANDOM  Final   Special Requests NONE  Final   Culture MULTIPLE SPECIES PRESENT, SUGGEST RECOLLECTION (A)  Final   Report Status 10/24/2016 FINAL  Final    [name], [name], [name] [email or web address] [phone] pager  [phone] cell 10/27/2016, 2:11 PM

## 2016-10-27 NOTE — Progress Notes (Signed)
Hypoglycemic Event  CBG: 58  Treatment: D50 IV 50 mL  Symptoms: None  Follow-up CBG: Time:1740 CBG Result:170  Possible Reasons for Event: Inadequate meal intake and Medication regimen: .  Comments/MD notified:Dr. Magnus Sinning

## 2016-10-28 LAB — BASIC METABOLIC PANEL
ANION GAP: 9 (ref 5–15)
BUN: 7 mg/dL (ref 6–20)
CHLORIDE: 107 mmol/L (ref 101–111)
CO2: 19 mmol/L — AB (ref 22–32)
Calcium: 8.6 mg/dL — ABNORMAL LOW (ref 8.9–10.3)
Creatinine, Ser: 0.49 mg/dL (ref 0.44–1.00)
GFR calc Af Amer: >60 mL/min (ref >60)
GFR calc non Af Amer: >60 mL/min (ref >60)
GLUCOSE: 181 mg/dL — AB (ref 65–99)
Potassium: 3.7 mmol/L (ref 3.5–5.1)
SODIUM: 135 mmol/L (ref 135–145)

## 2016-10-28 LAB — HEMOGLOBIN A1C
Hgb A1c MFr Bld: 9 % — ABNORMAL HIGH (ref 4.8–5.6)
MEAN PLASMA GLUCOSE: 211.6 mg/dL

## 2016-10-28 LAB — GLUCOSE, CAPILLARY
GLUCOSE-CAPILLARY: 245 mg/dL — AB (ref 65–99)
GLUCOSE-CAPILLARY: 150 mg/dL — AB (ref 65–99)
Glucose-Capillary: 157 mg/dL — ABNORMAL HIGH (ref 65–99)
Glucose-Capillary: 333 mg/dL — ABNORMAL HIGH (ref 65–99)

## 2016-10-28 LAB — URINE CULTURE

## 2016-10-28 MED ORDER — METOCLOPRAMIDE HCL 5 MG/ML IJ SOLN
10.0000 mg | Freq: Four times a day (QID) | INTRAMUSCULAR | Status: DC
Start: 2016-10-28 — End: 2016-10-29
  Administered 2016-10-28 – 2016-10-29 (×6): 10 mg via INTRAVENOUS
  Filled 2016-10-28 (×6): qty 2

## 2016-10-28 NOTE — Progress Notes (Signed)
Foley catheter removed per MD order.  Patient tolerated well.  

## 2016-10-28 NOTE — Progress Notes (Signed)
PROGRESS NOTE    [name]  DDU:[id number] DOB: June 22, 1990 DOA: 10/23/2016 PCP: Vicenta Aly, FNP    Brief Narrative:  26 y.o.femalewith past medical history of anxiety, asthma, diabetes, gastroparesis and pancreatitis who presents emergency room with chief complaint nausea and vomiting. Patient states she had acute onset of nausea vomiting around midnight. She vomitted continuously for 10 hours and was Nonbloody and nonbilious. Patient denies any diarrhea. No recent medication changes. Denies any drug use. No heavy alcohol use. She was given 3 different antiemeticswithout effect. Hospitalists were consulted for admission and patient being treated for her N/V and is likely from UTI, Hyperemesis Cannabinoid Syndrome, Diabetic Gastroparesis or combination of those.  TX to stepdown 10/7 for DKA , placed on insulin gtt , transferred out yesterday . Symptomatically improved   Assessment & Plan:   Principal Problem:   N&V (nausea and vomiting) Active Problems:   Uncontrolled type 1 diabetes mellitus (HCC)   Asthma   Hepatic steatosis   Dehydration   Abdominal pain   GERD (gastroesophageal reflux disease)   Depression with anxiety   UTI (urinary tract infection)   Leukocytosis  Intractable Nausea/Vomiting from either Hyperemesis Cannabinoid Syndrome, Diabetic Gastroparesis , UTI, DKA Patient initially presented with intractable nausea vomiting, treated with Antiemetics with Promethazine 12.5 mg IV q6hprn for N/V -KUB showed No acute finding.  -CT Scan of Abd/Pelvis showed No acute intra-abdominal process. Prominent distention of the stomach and duodenum, which may be related to patient's history of gastroparesis.  Marked bladder distention. Air within the bladder is likely related to recent instrumentation. No hydronephrosis. Four hyperenhancing lesions within the liver are similar in size to prior study, previously characterized as hepatic adenomas  -UDS positive for  Marijuana and Opiates -UA positive for UTI as it showed cloudy urine, Large Leukocytes, TNTC WBC. Seen by ID, recs to hold further abx as UTI was less likely -DKA resolved, pt still symptomatic with n/v -Pt currently only on 5mg  reglan scheduled. Will continue home dose of 10mg  scheduled  DKA-resolved, symptomatically patient has improved Continue Lantus, sliding scale insulin, patient's endocrinologist Dr. Hartford Poli has already switched patient to [name] 10 units daily. Patient will be discharged on Lantus, will follow-up with endocrinology, and discuss further treatment options, given DKA this admission -Stable at present   Insulin Dependent Diabetes Mellitus , DKA at time of admission  -Last HbA1c was 7.7, ABG shows pH of 7.3, PCO2 is 24.5, PaO2 113 -Repeat HbA1c this hospitalization noted to be 9.0, up from 7.7 on 6/28, suggesting poor control -DKA resolved -Stable at present    UTI ruled out Patient was started on fluconazole 10/8 with concerns of UTI. Infectious disease has discontinued Rocephin Completed 3 days of fluconazole  Vaginal yeast infection -s/p 3 days of fluconazole per ID  Asthma -C/w Albuterol 3 mL IH q6hprn for Wheezing/SOB -Stable at present  Hepeatic Steatosis -Continue to Monitor -LFT's noted to be normal on Admission  GERD -Currently no Symptoms -Will continue with Pantoprazole 40 mg IV while Nauseous and Vomiting   Depression with Anxiety -Denies SI/HI -Continue to Monitor  -Stable at present  Leukocytosis -Likely reactive from UTI vs. N/V -WBC went from 12.2 -> 7.8 -Afebrile  DVT prophylaxis: heparin subQ Code Status: Full Family Communication: Pt in room, family not at bedside Disposition Plan: Uncertain at this time  Consultants:     Procedures:     Antimicrobials: Anti-infectives    Start     Dose/Rate Route Frequency Ordered Stop   10/26/16  0900  fluconazole (DIFLUCAN) IVPB 200 mg     200 mg 100 mL/hr over  60 Minutes Intravenous Every 24 hours 10/26/16 0847 10/28/16 0944   10/23/16 1800  cefTRIAXone (ROCEPHIN) 1 g in dextrose 5 % 50 mL IVPB  Status:  Discontinued     1 g 100 mL/hr over 30 Minutes Intravenous Every 24 hours 10/23/16 1716 10/27/16 1414       Subjective: Continues to be nauseated, vomiting  Objective: Vitals:   10/28/16 0006 10/28/16 0450 10/28/16 1000 10/28/16 1400  BP: (!) 155/115 118/73 133/72 (!) 164/100  Pulse: (!) 117 (!) 110 (!) 107 (!) 112  Resp: 18 18 20 20   Temp: 98.9 F (37.2 C) 98.9 F (37.2 C) 98.1 F (36.7 C) 98.6 F (37 C)  TempSrc: Oral Oral Oral Oral  SpO2: 100% 98% 100% 100%  Weight:      Height:        Intake/Output Summary (Last 24 hours) at 10/28/16 1504 Last data filed at 10/28/16 1400  Gross per 24 hour  Intake              900 ml  Output             2675 ml  Net            -1775 ml   Filed Weights   10/23/16 0528 10/23/16 1613 10/25/16 1128  Weight: 56.7 kg (125 lb) 55.9 kg (123 lb 3.8 oz) 55.8 kg (123 lb 0.3 oz)    Examination:  General exam: Appears calm and comfortable  Respiratory system: Clear to auscultation. Respiratory effort normal. Cardiovascular system: S1 & S2 heard, RRR.  Gastrointestinal system: Abdomen is nondistended, soft and nontender. No organomegaly or masses felt. Normal bowel sounds heard. Central nervous system: Alert and oriented. No focal neurological deficits. Extremities: Symmetric 5 x 5 power. Skin: No rashes, lesions Psychiatry: Judgement and insight appear normal. Mood & affect appropriate.   Data Reviewed: I have personally reviewed following labs and imaging studies  CBC:  Recent Labs Lab 10/23/16 0802 10/24/16 0439 10/25/16 0422 10/26/16 0857  WBC 12.2* 7.8 9.0 6.9  NEUTROABS  --   --  6.9  --   HGB 12.4 12.0 12.7 12.4  HCT 37.3 36.6 39.2 37.3  MCV 85.7 87.4 87.5 84.8  PLT 425* 387 409* 644   Basic Metabolic Panel:  Recent Labs Lab 10/25/16 0422 10/25/16 1027  10/26/16 1530  10/27/16 0000 10/27/16 0835 10/27/16 1652 10/27/16 2343  NA 137 140  < > 135 138 136 136 135  K 4.0 5.0  < > 3.5 3.4* 4.4 3.5 3.7  CL 109 114*  < > 106 105 110 107 107  CO2 12* 12*  < > 19* 20* 14* 19* 19*  GLUCOSE 242* 145*  < > 144* 186* 245* 68 181*  BUN 13 14  < > 8 9 9 8 7   CREATININE 0.68 0.67  < > 0.50 0.56 0.73 0.50 0.49  CALCIUM 8.9 8.9  < > 8.3* 8.8* 8.8* 8.6* 8.6*  MG 1.8 1.9  --   --   --   --   --   --   PHOS 2.8  --   --   --   --   --   --   --   < > = values in this interval not displayed. GFR: Estimated Creatinine Clearance: 91.2 mL/min (by C-G formula based on SCr of 0.49 mg/dL). Liver Function Tests:  Recent Labs Lab  10/23/16 0802 10/25/16 0422 10/26/16 0857  AST 24 12* 15  ALT 13* 12* 10*  ALKPHOS 126 116 108  BILITOT 0.9 1.0 0.6  PROT 7.9 7.4 7.1  ALBUMIN 4.2 3.9 3.6    Recent Labs Lab 10/23/16 0802 10/25/16 0422 10/25/16 2107  LIPASE 17 14 17    No results for input(s): AMMONIA in the last 168 hours. Coagulation Profile: No results for input(s): INR, PROTIME in the last 168 hours. Cardiac Enzymes: No results for input(s): CKTOTAL, CKMB, CKMBINDEX, TROPONINI in the last 168 hours. BNP (last 3 results) No results for input(s): PROBNP in the last 8760 hours. HbA1C:  Recent Labs  10/27/16 2343  HGBA1C 9.0*   CBG:  Recent Labs Lab 10/27/16 1700 10/27/16 1739 10/27/16 2054 10/28/16 0741 10/28/16 1202  GLUCAP 58* 170* 112* 245* 150*   Lipid Profile:  Recent Labs  10/26/16 0857  CHOL 121  HDL 53  LDLCALC 42  TRIG 130  CHOLHDL 2.3   Thyroid Function Tests: No results for input(s): TSH, T4TOTAL, FREET4, T3FREE, THYROIDAB in the last 72 hours. Anemia Panel: No results for input(s): VITAMINB12, FOLATE, FERRITIN, TIBC, IRON, RETICCTPCT in the last 72 hours. Sepsis Labs:  Recent Labs Lab 10/23/16 0815  LATICACIDVEN 1.72    Recent Results (from the past 240 hour(s))  Culture, Urine     Status: Abnormal   Collection Time:  10/23/16  1:30 PM  Result Value Ref Range Status   Specimen Description URINE, RANDOM  Final   Special Requests NONE  Final   Culture MULTIPLE SPECIES PRESENT, SUGGEST RECOLLECTION (A)  Final   Report Status 10/24/2016 FINAL  Final     Radiology Studies: No results found.  Scheduled Meds: . cycloSPORINE  1 drop Both Eyes Q12H  . dicyclomine  20 mg Oral BID  . heparin  5,000 Units Subcutaneous Q8H  . insulin aspart  0-9 Units Subcutaneous TID WC  . insulin glargine  15 Units Subcutaneous Daily  . metoCLOPramide (REGLAN) injection  10 mg Intravenous Q6H  . oxybutynin  2.5 mg Oral TID  . pantoprazole (PROTONIX) IV  40 mg Intravenous Q24H  . sodium chloride flush  3 mL Intravenous Q12H   Continuous Infusions: . 0.9 % NaCl with KCl 40 mEq / L 100 mL/hr (10/28/16 1259)     LOS: 4 days   CHIU, Orpah Melter, MD [facility] Pager [phone]  If 7PM-7AM, please contact night-coverage [email or web address] Password [email or web address] 10/28/2016, 3:04 PM

## 2016-10-29 DIAGNOSIS — E10641 Type 1 diabetes mellitus with hypoglycemia with coma: Secondary | ICD-10-CM

## 2016-10-29 DIAGNOSIS — F502 Bulimia nervosa: Secondary | ICD-10-CM

## 2016-10-29 LAB — BASIC METABOLIC PANEL
ANION GAP: 7 (ref 5–15)
BUN: 12 mg/dL (ref 6–20)
CALCIUM: 8.5 mg/dL — AB (ref 8.9–10.3)
CO2: 22 mmol/L (ref 22–32)
Chloride: 105 mmol/L (ref 101–111)
Creatinine, Ser: 0.52 mg/dL (ref 0.44–1.00)
Glucose, Bld: 255 mg/dL — ABNORMAL HIGH (ref 65–99)
Potassium: 4.1 mmol/L (ref 3.5–5.1)
SODIUM: 134 mmol/L — AB (ref 135–145)

## 2016-10-29 LAB — GLUCOSE, CAPILLARY
GLUCOSE-CAPILLARY: 208 mg/dL — AB (ref 65–99)
GLUCOSE-CAPILLARY: 281 mg/dL — AB (ref 65–99)
Glucose-Capillary: 237 mg/dL — ABNORMAL HIGH (ref 65–99)

## 2016-10-29 MED ORDER — INSULIN GLARGINE 100 UNIT/ML ~~LOC~~ SOLN
25.0000 [IU] | Freq: Every day | SUBCUTANEOUS | Status: DC
  Administered 2016-10-29: 25 [IU] via SUBCUTANEOUS
  Filled 2016-10-29: qty 0.25

## 2016-10-29 MED ORDER — INSULIN GLARGINE 100 UNIT/ML ~~LOC~~ SOLN: 25.0000 [IU] | Freq: Every day | SUBCUTANEOUS | Status: DC

## 2016-10-29 MED ORDER — INSULIN GLARGINE 100 UNIT/ML ~~LOC~~ SOLN
20.0000 [IU] | Freq: Every day | SUBCUTANEOUS | Status: DC
  Filled 2016-10-29 (×2): qty 0.2

## 2016-10-29 MED ORDER — OXYBUTYNIN CHLORIDE 5 MG PO TABS: 2.5000 mg | ORAL_TABLET | Freq: Three times a day (TID) | ORAL | 0 refills | Status: DC

## 2016-10-29 MED ORDER — PANTOPRAZOLE SODIUM 40 MG PO TBEC: 40.0000 mg | DELAYED_RELEASE_TABLET | Freq: Every day | ORAL | Status: DC

## 2016-10-29 NOTE — Progress Notes (Signed)
Date:  October 29, 2016 Chart reviewed for concurrent status and case management needs.  Will continue to follow patient progress.  Discharge Planning: following for needs  Expected discharge date: [id number]  Rhonda Davis, BSN, RN3, CCM   [phone]  

## 2016-10-29 NOTE — Progress Notes (Signed)
Inpatient Diabetes Program Recommendations  AACE/ADA: New Consensus Statement on Inpatient Glycemic Control (2015)  Target Ranges:  Prepandial:   less than 140 mg/dL      Peak postprandial:   less than 180 mg/dL (1-2 hours)      Critically ill patients:  140 - 180 mg/dL   Lab Results  Component Value Date   GLUCAP 208 (H) 10/29/2016   HGBA1C 9.0 (H) 10/27/2016    Review of Glycemic Control  Blood sugars in 200s this am. Lantus was decreased to 15 units yesterday. Still poor po intake.   Inpatient Diabetes Program Recommendations:    If pt continues to not eat, would change Novolog to 0-9 units Q4H. When eating, will need meal coverage - Novolog 4 units tidwc. Needs psych consult for assessment of depression, which seems to be hindering pt taking care of herself.  Will continue to follow.  Thank you. Lorenda Peck, RD, LDN, CDE Inpatient Diabetes Coordinator [phone]

## 2016-10-29 NOTE — Discharge Summary (Signed)
Physician Discharge Summary  [name] ZYS:[id number] DOB: [birth year] DOA: 10/23/2016  PCP: Stefanie Aly, FNP  Admit date: 10/23/2016 Discharge date: 10/29/2016  Admitted From: Home Disposition:  Home  Recommendations for Outpatient Follow-up:  1. Follow up with PCP in 1-2 weeks 2. Follow up with Endocrinology in 1-2 weeks  Discharge Condition:Improved CODE STATUS:Full Diet recommendation: Diuabetic   Brief/Interim Summary: 26 y.o.femalewith past medical history of anxiety, asthma, diabetes, gastroparesis and pancreatitis who presents emergency room with chief complaint nausea and vomiting. Patient states she had acute onset of nausea vomiting around midnight. She vomitted continuously for 10 hours and was Nonbloody and nonbilious. Patient denies any diarrhea. No recent medication changes. Denies any drug use. No heavy alcohol use. She was given 3 different antiemeticswithout effect. Hospitalists were consulted for admission and patient being treated for her N/V and is likely from UTI, Hyperemesis Cannabinoid Syndrome, Diabetic Gastroparesis or combination of those. TX to stepdown 10/7 for DKA , placed on insulin gtt , transferred out yesterday . Symptomatically improved    Discharge Diagnoses:  Principal Problem:   N&V (nausea and vomiting) Active Problems:   Uncontrolled type 1 diabetes mellitus (HCC)   Asthma   Hepatic steatosis   Dehydration   Abdominal pain   GERD (gastroesophageal reflux disease)   Depression with anxiety   UTI (urinary tract infection)   Leukocytosis  Intractable Nausea/Vomiting from either Hyperemesis Cannabinoid Syndrome, Diabetic Gastroparesis , UTI, DKA Patient initially presented with intractable nausea vomiting, treated withAntiemetics with Promethazine 12.5 mg IV q6hprn for N/V -KUB showed No acute finding.  -CT Scan of Abd/Pelvis showed No acute intra-abdominal process. Prominent distention of the stomach and duodenum, which may  be related to patient's history of gastroparesis. Marked bladder distention. Air within the bladder is likely related to recent instrumentation. No hydronephrosis. Four hyperenhancing lesions within the liver are similar in size to prior study, previously characterized as hepatic adenomas  -UDS positive for Marijuana and Opiates -UA positive for UTI as it showed cloudy urine, Large Leukocytes, TNTC WBC. Seen by ID, recs to hold further abx as UTI was less likely -DKA resolved, pt still symptomatic with n/v -Pt currently only on 5mg  reglan scheduled. Have continued home dose of 10mg  scheduled with much clinical improvement and return to baseline state  DKA-resolved, symptomatically patient has improved Continue Lantus, sliding scale insulin, patient's endocrinologist [name] has already switched patient to [name] Flextouch 10 units daily. Patient will be discharged on Lantus, will follow-up with endocrinology, and discuss further treatment options, given DKA this admission -Will continue lantus 25 units with pre-meal coverage per below  Insulin Dependent Diabetes Mellitus , DKA at time of admission  -Last HbA1c was 7.7,ABG shows pH of 7.3, PCO2 is 24.5, PaO2 113 -Repeat HbA1c this hospitalization noted to be 9.0, up from 7.7 on 6/28, suggesting poor control -DKA resolved -Per above, will continue on current insulin regimen as glucose remains consistently in the 200 range and have patient follow up with endocrinologist within one week   UTI ruled out Patient was started on fluconazole 10/8 with concerns of UTI. Infectious disease has discontinued Rocephin Completed 3 days of fluconazole  Vaginal yeast infection -s/p 3 days of fluconazole per ID  Asthma -C/w Albuterol 3 mL IH q6hprn for Wheezing/SOB -Stable at present  Hepeatic Steatosis -Continue to Monitor -LFT's noted to be normal on Admission  GERD -Currently no Symptoms -Continued withPantoprazole 40 mg IV while  Nauseous and Vomiting   Depression with Anxiety -Denies SI/HI -Continue to  Monitor  -Stable at present  Leukocytosis -Likely reactive from UTI vs. N/V -WBC went from 12.2 -> 7.8 -Afebrile  Discharge Instructions   Allergies as of 10/29/2016      Reactions   Peanut-containing Drug Products Swelling, Other (See Comments)   Reaction:  Swelling of mouth and lips    Food Swelling, Other (See Comments)   Pt is allergic to strawberries.   Reaction:  Swelling of mouth and lips    Ultram [tramadol] Itching      Medication List    STOP taking these medications   erythromycin 250 MG tablet Commonly known as:  E-MYCIN   meloxicam 7.5 MG tablet Commonly known as:  MOBIC     TAKE these medications   albuterol 108 (90 Base) MCG/ACT inhaler Commonly known as:  PROVENTIL HFA;VENTOLIN HFA Inhale 1-2 puffs into the lungs every 6 (six) hours as needed for wheezing or shortness of breath.   dicyclomine 20 MG tablet Commonly known as:  BENTYL Take 1 tablet (20 mg total) by mouth 2 (two) times daily.   glucagon 1 MG injection Commonly known as:  GLUCAGON EMERGENCY Inject 1 mg into the vein once as needed. What changed:  additional instructions   insulin glargine 100 UNIT/ML injection Commonly known as:  LANTUS Inject 0.25 mLs (25 Units total) into the skin daily.   lisinopril 2.5 MG tablet Commonly known as:  PRINIVIL,ZESTRIL Take 2.5 mg by mouth daily.   metoCLOPramide 10 MG tablet Commonly known as:  REGLAN Take 1 tablet (10 mg total) by mouth every 6 (six) hours as needed for nausea.   metoprolol tartrate 25 MG tablet Commonly known as:  LOPRESSOR Take 0.5 tablets (12.5 mg total) by mouth 2 (two) times daily.   NOVOLOG FLEXPEN 100 UNIT/ML FlexPen Generic drug:  insulin aspart Inject 1-10 Units into the skin 3 (three) times daily with meals. Pt uses 1 unit for every 10 grams of carbs with meals and snacks for BS greater than 50.   ondansetron 4 MG tablet Commonly  known as:  ZOFRAN Take 1 tablet (4 mg total) by mouth every 6 (six) hours. What changed:  when to take this  reasons to take this   oxybutynin 5 MG tablet Commonly known as:  DITROPAN Take 0.5 tablets (2.5 mg total) by mouth 3 (three) times daily.   RESTASIS MULTIDOSE 0.05 % ophthalmic emulsion Generic drug:  cycloSPORINE Place 1 drop into both eyes every 12 (twelve) hours.   Vitamin D (Ergocalciferol) 50000 units Caps capsule Commonly known as:  DRISDOL Take 50,000 Units by mouth once a week. On [place], [place], [place]. Schedule an appointment as soon as possible for a visit in 1 week(s).   Specialty:  Nurse Practitioner Contact information: [place] [address] [phone]        Gerome Apley, MD. Schedule an appointment as soon as possible for a visit in 1 week(s).   Specialty:  Endocrinology Contact information: [address] [place] [address] [phone]            Procedures/Studies: Ct Abdomen Pelvis W Contrast  Result Date: 10/23/2016 CLINICAL DATA:  Nausea and vomiting. EXAM: CT ABDOMEN AND PELVIS WITH CONTRAST TECHNIQUE: Multidetector CT imaging of the abdomen and pelvis was performed using the standard protocol following bolus administration of intravenous contrast. CONTRAST:  134mL ISOVUE-300 IOPAMIDOL (ISOVUE-300) INJECTION 61% COMPARISON:  CT abdomen pelvis dated February 20, 2016. FINDINGS: Lower chest: No acute abnormality. Hepatobiliary: Four hyperenhancing lesions within the liver are again seen, previously characterized as hepatic adenomas. Status post cholecystectomy. No biliary dilatation. Pancreas: Unremarkable. No pancreatic ductal dilatation or surrounding inflammatory changes. Spleen: Normal in size without focal abnormality. Adrenals/Urinary Tract: Adrenal glands are unremarkable. Kidneys are normal, without renal calculi, focal lesion, or  hydronephrosis. The bladder is markedly distended. Air within the bladder is likely related to recent instrumentation. Stomach/Bowel: The stomach and duodenum are prominently distended. Appendix appears normal. No evidence of bowel wall thickening, distention, or inflammatory changes. Vascular/Lymphatic: No significant vascular findings are present. Probable mixing artifact in the portal system. No enlarged abdominal or pelvic lymph nodes. Reproductive: Uterus and bilateral adnexa are unremarkable. Right corpus luteum. Other: Trace free fluid in the pelvis is likely physiologic. No pneumoperitoneum. Musculoskeletal: No acute or significant osseous findings. IMPRESSION: 1. No acute intra-abdominal process. 2. Prominent distention of the stomach and duodenum, which may be related to patient's history of gastroparesis. 3. Marked bladder distention. Air within the bladder is likely related to recent instrumentation. No hydronephrosis. 4. Four hyperenhancing lesions within the liver are similar in size to prior study, previously characterized as hepatic adenomas. Electronically Signed   By: Titus Dubin M.D.   On: 10/23/2016 20:26   Dg Abd Acute W/chest  Result Date: 10/23/2016 CLINICAL DATA:  Nausea and vomiting since this morning. History of gastroparesis. EXAM: DG ABDOMEN ACUTE W/ 1V CHEST COMPARISON:  07/23/2016 FINDINGS: Bowel gas pattern is within normal limits. Relatively gasless abdomen consistent with the history of vomiting. Surgical clips related to previous cholecystectomy. No free air. One-view chest is normal. IMPRESSION: No acute finding. Relatively gasless abdomen consistent with the clinical history of vomiting. Electronically Signed   By: Nelson Chimes M.D.   On: 10/23/2016 14:12    Subjective: Eager to go home  Discharge Exam: Vitals:   10/29/16 1115 10/29/16 1350  BP: 116/77 121/79  Pulse: (!) 102 93  Resp: 18 18  Temp: 98.6 F (37 C) 98.6 F (37 C)  SpO2: 100% 99%   Vitals:    10/28/16 2035 10/29/16 0400 10/29/16 1115 10/29/16 1350  BP: 130/89 113/74 116/77 121/79  Pulse: (!) 105 95 (!) 102 93  Resp: 18 18 18 18   Temp: 98.7 F (37.1 C) 98.5 F (36.9 C) 98.6 F (37 C) 98.6 F (37 C)  TempSrc: Oral Oral Oral Oral  SpO2: 100% 98% 100% 99%  Weight:      Height:        General: Pt is alert, awake, not in acute distress Cardiovascular: RRR, S1/S2 +, no rubs, no gallops Respiratory: CTA bilaterally, no wheezing, no rhonchi Abdominal: Soft, NT, ND, bowel sounds + Extremities: no edema, no cyanosis   The results of significant diagnostics from this hospitalization (including imaging, microbiology, ancillary and laboratory) are listed below for reference.     Microbiology: Recent Results (from the past 240 hour(s))  Culture, Urine     Status: Abnormal   Collection Time: 10/23/16  1:30 PM  Result Value Ref Range Status   Specimen Description URINE, RANDOM  Final   Special Requests NONE  Final   Culture MULTIPLE SPECIES PRESENT, SUGGEST RECOLLECTION (A)  Final   Report Status 10/24/2016 FINAL  Final  Urine Culture     Status: Abnormal   Collection Time: 10/25/16  5:00 PM  Result Value Ref Range Status   Specimen Description URINE, RANDOM  Final   Special Requests fungal culture of urine  if possible  Final   Culture >=100,000 COLONIES/mL YEAST (A)  Final   Report Status 10/28/2016 FINAL  Final     Labs: BNP (last 3 results) No results for input(s): BNP in the last 8760 hours. Basic Metabolic Panel:  Recent Labs Lab 10/25/16 0422 10/25/16 1027  10/27/16 0000 10/27/16 0835 10/27/16 1652 10/27/16 2343 10/29/16 0543  NA 137 140  < > 138 136 136 135 134*  K 4.0 5.0  < > 3.4* 4.4 3.5 3.7 4.1  CL 109 114*  < > 105 110 107 107 105  CO2 12* 12*  < > 20* 14* 19* 19* 22  GLUCOSE 242* 145*  < > 186* 245* 68 181* 255*  BUN 13 14  < > 9 9 8 7 12   CREATININE 0.68 0.67  < > 0.56 0.73 0.50 0.49 0.52  CALCIUM 8.9 8.9  < > 8.8* 8.8* 8.6* 8.6* 8.5*  MG 1.8  1.9  --   --   --   --   --   --   PHOS 2.8  --   --   --   --   --   --   --   < > = values in this interval not displayed. Liver Function Tests:  Recent Labs Lab 10/23/16 0802 10/25/16 0422 10/26/16 0857  AST 24 12* 15  ALT 13* 12* 10*  ALKPHOS 126 116 108  BILITOT 0.9 1.0 0.6  PROT 7.9 7.4 7.1  ALBUMIN 4.2 3.9 3.6    Recent Labs Lab 10/23/16 0802 10/25/16 0422 10/25/16 2107  LIPASE 17 14 17    No results for input(s): AMMONIA in the last 168 hours. CBC:  Recent Labs Lab 10/23/16 0802 10/24/16 0439 10/25/16 0422 10/26/16 0857  WBC 12.2* 7.8 9.0 6.9  NEUTROABS  --   --  6.9  --   HGB 12.4 12.0 12.7 12.4  HCT 37.3 36.6 39.2 37.3  MCV 85.7 87.4 87.5 84.8  PLT 425* 387 409* 380   Cardiac Enzymes: No results for input(s): CKTOTAL, CKMB, CKMBINDEX, TROPONINI in the last 168 hours. BNP: Invalid input(s): POCBNP CBG:  Recent Labs Lab 10/28/16 1709 10/28/16 2043 10/29/16 0732 10/29/16 1118 10/29/16 1703  GLUCAP 157* 333* 208* 237* 281*   D-Dimer No results for input(s): DDIMER in the last 72 hours. Hgb A1c  Recent Labs  10/27/16 2343  HGBA1C 9.0*   Lipid Profile No results for input(s): CHOL, HDL, LDLCALC, TRIG, CHOLHDL, LDLDIRECT in the last 72 hours. Thyroid function studies No results for input(s): TSH, T4TOTAL, T3FREE, THYROIDAB in the last 72 hours.  Invalid input(s): FREET3 Anemia work up No results for input(s): VITAMINB12, FOLATE, FERRITIN, TIBC, IRON, RETICCTPCT in the last 72 hours. Urinalysis    Component Value Date/Time   COLORURINE YELLOW 10/25/2016 1715   APPEARANCEUR TURBID (A) 10/25/2016 1715   LABSPEC 1.025 10/25/2016 1715   PHURINE 6.0 10/25/2016 1715   GLUCOSEU NEGATIVE 10/25/2016 1715   GLUCOSEU >=1000 11/07/2012 1205   HGBUR MODERATE (A) 10/25/2016 1715   BILIRUBINUR NEGATIVE 10/25/2016 1715   KETONESUR >80 (A) 10/25/2016 1715   PROTEINUR 30 (A) 10/25/2016 1715   UROBILINOGEN 0.2 11/24/2014 1045   NITRITE NEGATIVE  10/25/2016 1715   LEUKOCYTESUR LARGE (A) 10/25/2016 1715   Sepsis Labs Invalid input(s): PROCALCITONIN,  WBC,  LACTICIDVEN Microbiology Recent Results (from the past 240 hour(s))  Culture, Urine     Status: Abnormal   Collection Time: 10/23/16  1:30 PM  Result Value Ref Range Status  Specimen Description URINE, RANDOM  Final   Special Requests NONE  Final   Culture MULTIPLE SPECIES PRESENT, SUGGEST RECOLLECTION (A)  Final   Report Status 10/24/2016 FINAL  Final  Urine Culture     Status: Abnormal   Collection Time: 10/25/16  5:00 PM  Result Value Ref Range Status   Specimen Description URINE, RANDOM  Final   Special Requests fungal culture of urine if possible  Final   Culture >=100,000 COLONIES/mL YEAST (A)  Final   Report Status 10/28/2016 FINAL  Final     SIGNED:   Donne Hazel, MD  [facility] 10/29/2016, 5:17 PM  If 7PM-7AM, please contact night-coverage [email or web address] Password [email or web address]

## 2016-10-29 NOTE — Progress Notes (Signed)
10/29/16  1750  Reviewed discharge instructions with patient. Patient verbalized understanding of discharge instructions. Copy of discharge instructions given to patient.

## 2016-10-29 NOTE — Progress Notes (Signed)
This patient is receiving IV Protonix. Based on criteria approved by the Pharmacy and Therapeutics Committee, this medication is being converted to the equivalent oral dose form. These criteria include: . The patient is eating (either orally or per tube) and/or has been taking other orally administered medications for at least 24 hours. . This patient has no evidence of active gastrointestinal bleeding or impaired GI absorption (gastrectomy, short bowel, patient on TNA or NPO).  If you have questions about this conversion, please contact the pharmacy department. Thank you.  Hershal Coria, PharmD, BCPS Pager: [phone] 10/29/2016 2:38 PM

## 2016-11-05 ENCOUNTER — Encounter (HOSPITAL_COMMUNITY): Payer: Self-pay | Admitting: Emergency Medicine

## 2016-11-05 ENCOUNTER — Inpatient Hospital Stay (HOSPITAL_COMMUNITY)
Admission: EM | Admit: 2016-11-05 | Discharge: 2016-11-11 | DRG: 638 | Disposition: A | Discharge status: Home / Self Care | Payer: Medicaid Other | Attending: Internal Medicine | Admitting: Internal Medicine

## 2016-11-05 DIAGNOSIS — Z9119 Patient's noncompliance with other medical treatment and regimen: Secondary | ICD-10-CM

## 2016-11-05 DIAGNOSIS — E1110 Type 2 diabetes mellitus with ketoacidosis without coma: Secondary | ICD-10-CM | POA: Diagnosis present

## 2016-11-05 DIAGNOSIS — Z833 Family history of diabetes mellitus: Secondary | ICD-10-CM

## 2016-11-05 DIAGNOSIS — E1010 Type 1 diabetes mellitus with ketoacidosis without coma: Secondary | ICD-10-CM

## 2016-11-05 DIAGNOSIS — Z91010 Allergy to peanuts: Secondary | ICD-10-CM

## 2016-11-05 DIAGNOSIS — F419 Anxiety disorder, unspecified: Secondary | ICD-10-CM | POA: Diagnosis present

## 2016-11-05 DIAGNOSIS — K3184 Gastroparesis: Secondary | ICD-10-CM | POA: Diagnosis not present

## 2016-11-05 DIAGNOSIS — Z888 Allergy status to other drugs, medicaments and biological substances status: Secondary | ICD-10-CM

## 2016-11-05 DIAGNOSIS — I10 Essential (primary) hypertension: Secondary | ICD-10-CM | POA: Diagnosis not present

## 2016-11-05 DIAGNOSIS — Z8249 Family history of ischemic heart disease and other diseases of the circulatory system: Secondary | ICD-10-CM

## 2016-11-05 DIAGNOSIS — R339 Retention of urine, unspecified: Secondary | ICD-10-CM | POA: Diagnosis not present

## 2016-11-05 DIAGNOSIS — K219 Gastro-esophageal reflux disease without esophagitis: Secondary | ICD-10-CM | POA: Diagnosis present

## 2016-11-05 DIAGNOSIS — K760 Fatty (change of) liver, not elsewhere classified: Secondary | ICD-10-CM | POA: Diagnosis present

## 2016-11-05 DIAGNOSIS — F1290 Cannabis use, unspecified, uncomplicated: Secondary | ICD-10-CM | POA: Diagnosis present

## 2016-11-05 DIAGNOSIS — D899 Disorder involving the immune mechanism, unspecified: Secondary | ICD-10-CM | POA: Diagnosis present

## 2016-11-05 DIAGNOSIS — R160 Hepatomegaly, not elsewhere classified: Secondary | ICD-10-CM | POA: Diagnosis present

## 2016-11-05 DIAGNOSIS — F329 Major depressive disorder, single episode, unspecified: Secondary | ICD-10-CM | POA: Diagnosis present

## 2016-11-05 DIAGNOSIS — I5032 Chronic diastolic (congestive) heart failure: Secondary | ICD-10-CM | POA: Diagnosis present

## 2016-11-05 DIAGNOSIS — I110 Hypertensive heart disease with heart failure: Secondary | ICD-10-CM | POA: Diagnosis present

## 2016-11-05 DIAGNOSIS — R112 Nausea with vomiting, unspecified: Secondary | ICD-10-CM | POA: Diagnosis present

## 2016-11-05 DIAGNOSIS — Z23 Encounter for immunization: Secondary | ICD-10-CM

## 2016-11-05 DIAGNOSIS — Z79899 Other long term (current) drug therapy: Secondary | ICD-10-CM

## 2016-11-05 DIAGNOSIS — E1043 Type 1 diabetes mellitus with diabetic autonomic (poly)neuropathy: Secondary | ICD-10-CM | POA: Diagnosis present

## 2016-11-05 DIAGNOSIS — Z9049 Acquired absence of other specified parts of digestive tract: Secondary | ICD-10-CM

## 2016-11-05 DIAGNOSIS — Z794 Long term (current) use of insulin: Secondary | ICD-10-CM

## 2016-11-05 DIAGNOSIS — N390 Urinary tract infection, site not specified: Secondary | ICD-10-CM | POA: Diagnosis present

## 2016-11-05 LAB — BLOOD GAS, VENOUS
ACID-BASE DEFICIT: 12.6 mmol/L — AB (ref 0.0–2.0)
Bicarbonate: 13 mmol/L — ABNORMAL LOW (ref 20.0–28.0)
O2 Saturation: 72.5 %
PCO2 VEN: 30.1 mmHg — AB (ref 44.0–60.0)
Patient temperature: 98.6
pH, Ven: 7.259 (ref 7.250–7.430)
pO2, Ven: 45.4 mmHg — ABNORMAL HIGH (ref 32.0–45.0)

## 2016-11-05 LAB — RAPID URINE DRUG SCREEN, HOSP PERFORMED
Amphetamines: NOT DETECTED
Barbiturates: NOT DETECTED
Benzodiazepines: NOT DETECTED
Cocaine: NOT DETECTED
Opiates: NOT DETECTED
Tetrahydrocannabinol: POSITIVE — AB

## 2016-11-05 LAB — COMPREHENSIVE METABOLIC PANEL
ALK PHOS: 106 U/L (ref 38–126)
ALT: 11 U/L — ABNORMAL LOW (ref 14–54)
ANION GAP: 19 — AB (ref 5–15)
AST: 13 U/L — ABNORMAL LOW (ref 15–41)
Albumin: 4.6 g/dL (ref 3.5–5.0)
BILIRUBIN TOTAL: 1.3 mg/dL — AB (ref 0.3–1.2)
BUN: 16 mg/dL (ref 6–20)
CALCIUM: 9.3 mg/dL (ref 8.9–10.3)
CO2: 16 mmol/L — ABNORMAL LOW (ref 22–32)
Chloride: 104 mmol/L (ref 101–111)
Creatinine, Ser: 0.76 mg/dL (ref 0.44–1.00)
GLUCOSE: 392 mg/dL — AB (ref 65–99)
POTASSIUM: 4.4 mmol/L (ref 3.5–5.1)
Sodium: 139 mmol/L (ref 135–145)
TOTAL PROTEIN: 8.3 g/dL — AB (ref 6.5–8.1)

## 2016-11-05 LAB — URINALYSIS, ROUTINE W REFLEX MICROSCOPIC
Bilirubin Urine: NEGATIVE
Glucose, UA: >=500 mg/dL — AB
KETONES UR: 80 mg/dL — AB
Nitrite: NEGATIVE
PROTEIN: 30 mg/dL — AB
Specific Gravity, Urine: 1.026 (ref 1.005–1.030)
Squamous Epithelial / LPF: NONE SEEN
pH: 5 (ref 5.0–8.0)

## 2016-11-05 LAB — BASIC METABOLIC PANEL
ANION GAP: 12 (ref 5–15)
ANION GAP: 11 (ref 5–15)
BUN: 14 mg/dL (ref 6–20)
BUN: 17 mg/dL (ref 6–20)
CALCIUM: 9.3 mg/dL (ref 8.9–10.3)
CALCIUM: 8.9 mg/dL (ref 8.9–10.3)
CO2: 17 mmol/L — ABNORMAL LOW (ref 22–32)
CO2: 16 mmol/L — ABNORMAL LOW (ref 22–32)
Chloride: 106 mmol/L (ref 101–111)
Chloride: 106 mmol/L (ref 101–111)
Creatinine, Ser: 0.61 mg/dL (ref 0.44–1.00)
Creatinine, Ser: 0.83 mg/dL (ref 0.44–1.00)
GLUCOSE: 179 mg/dL — AB (ref 65–99)
Glucose, Bld: 263 mg/dL — ABNORMAL HIGH (ref 65–99)
POTASSIUM: 4.5 mmol/L (ref 3.5–5.1)
POTASSIUM: 4.8 mmol/L (ref 3.5–5.1)
Sodium: 135 mmol/L (ref 135–145)
Sodium: 133 mmol/L — ABNORMAL LOW (ref 135–145)

## 2016-11-05 LAB — GLUCOSE, CAPILLARY
GLUCOSE-CAPILLARY: 211 mg/dL — AB (ref 65–99)
GLUCOSE-CAPILLARY: 127 mg/dL — AB (ref 65–99)
GLUCOSE-CAPILLARY: 177 mg/dL — AB (ref 65–99)
GLUCOSE-CAPILLARY: 198 mg/dL — AB (ref 65–99)
GLUCOSE-CAPILLARY: 166 mg/dL — AB (ref 65–99)
Glucose-Capillary: 217 mg/dL — ABNORMAL HIGH (ref 65–99)
Glucose-Capillary: 154 mg/dL — ABNORMAL HIGH (ref 65–99)
Glucose-Capillary: 162 mg/dL — ABNORMAL HIGH (ref 65–99)

## 2016-11-05 LAB — MAGNESIUM: MAGNESIUM: 1.7 mg/dL (ref 1.7–2.4)

## 2016-11-05 LAB — CBC
HCT: 43.5 % (ref 36.0–46.0)
Hemoglobin: 14.7 g/dL (ref 12.0–15.0)
MCH: 29.3 pg (ref 26.0–34.0)
MCHC: 33.8 g/dL (ref 30.0–36.0)
MCV: 86.7 fL (ref 78.0–100.0)
PLATELETS: 494 10*3/uL — AB (ref 150–400)
RBC: 5.02 MIL/uL (ref 3.87–5.11)
RDW: 14.5 % (ref 11.5–15.5)
WBC: 19.6 10*3/uL — AB (ref 4.0–10.5)

## 2016-11-05 LAB — MRSA PCR SCREENING: MRSA by PCR: NEGATIVE

## 2016-11-05 LAB — I-STAT BETA HCG BLOOD, ED (MC, WL, AP ONLY)

## 2016-11-05 LAB — I-STAT CG4 LACTIC ACID, ED: LACTIC ACID, VENOUS: 1.26 mmol/L (ref 0.5–1.9)

## 2016-11-05 LAB — HEMOGLOBIN A1C
HEMOGLOBIN A1C: 9.1 % — AB (ref 4.8–5.6)
MEAN PLASMA GLUCOSE: 214.47 mg/dL

## 2016-11-05 LAB — CBG MONITORING, ED
GLUCOSE-CAPILLARY: 410 mg/dL — AB (ref 65–99)
GLUCOSE-CAPILLARY: 291 mg/dL — AB (ref 65–99)
GLUCOSE-CAPILLARY: 262 mg/dL — AB (ref 65–99)
Glucose-Capillary: 366 mg/dL — ABNORMAL HIGH (ref 65–99)

## 2016-11-05 LAB — LIPASE, BLOOD: LIPASE: 16 U/L (ref 11–51)

## 2016-11-05 MED ORDER — ALBUTEROL SULFATE HFA 108 (90 BASE) MCG/ACT IN AERS: 1.0000 | INHALATION_SPRAY | Freq: Four times a day (QID) | RESPIRATORY_TRACT | Status: DC | PRN

## 2016-11-05 MED ORDER — PROMETHAZINE HCL 25 MG/ML IJ SOLN
25.0000 mg | Freq: Once | INTRAMUSCULAR | Status: AC
  Administered 2016-11-05: 25 mg via INTRAVENOUS
  Filled 2016-11-05: qty 1

## 2016-11-05 MED ORDER — SODIUM CHLORIDE 0.9 % IV SOLN
INTRAVENOUS | Status: DC
  Administered 2016-11-05: 3.5 [IU]/h via INTRAVENOUS
  Filled 2016-11-05: qty 1

## 2016-11-05 MED ORDER — SODIUM CHLORIDE 0.9 % IV SOLN
INTRAVENOUS | Status: DC
  Filled 2016-11-05 (×2): qty 1

## 2016-11-05 MED ORDER — INFLUENZA VAC SPLIT QUAD 0.5 ML IM SUSY
0.5000 mL | PREFILLED_SYRINGE | INTRAMUSCULAR | Status: AC
  Administered 2016-11-06: 0.5 mL via INTRAMUSCULAR
  Filled 2016-11-05: qty 0.5

## 2016-11-05 MED ORDER — POTASSIUM CHLORIDE 10 MEQ/100ML IV SOLN
10.0000 meq | INTRAVENOUS | Status: DC
  Filled 2016-11-05: qty 100

## 2016-11-05 MED ORDER — POTASSIUM CHLORIDE 10 MEQ/100ML IV SOLN
10.0000 meq | INTRAVENOUS | Status: AC
  Administered 2016-11-05 (×2): 10 meq via INTRAVENOUS
  Filled 2016-11-05: qty 100

## 2016-11-05 MED ORDER — KETOROLAC TROMETHAMINE 30 MG/ML IJ SOLN
30.0000 mg | Freq: Once | INTRAMUSCULAR | Status: AC
  Administered 2016-11-05: 30 mg via INTRAVENOUS
  Filled 2016-11-05: qty 1

## 2016-11-05 MED ORDER — ONDANSETRON HCL 4 MG/2ML IJ SOLN
4.0000 mg | Freq: Once | INTRAMUSCULAR | Status: DC | PRN
  Filled 2016-11-05: qty 2

## 2016-11-05 MED ORDER — ALBUTEROL SULFATE (2.5 MG/3ML) 0.083% IN NEBU: 2.5000 mg | INHALATION_SOLUTION | Freq: Four times a day (QID) | RESPIRATORY_TRACT | Status: DC | PRN

## 2016-11-05 MED ORDER — SODIUM CHLORIDE 0.9 % IV BOLUS (SEPSIS)
1000.0000 mL | Freq: Once | INTRAVENOUS | Status: AC
  Administered 2016-11-05: 1000 mL via INTRAVENOUS

## 2016-11-05 MED ORDER — SODIUM CHLORIDE 0.9 % IV BOLUS (SEPSIS): 1000.0000 mL | Freq: Once | INTRAVENOUS | Status: DC

## 2016-11-05 MED ORDER — PROMETHAZINE HCL 25 MG/ML IJ SOLN
25.0000 mg | Freq: Three times a day (TID) | INTRAMUSCULAR | Status: DC | PRN
  Administered 2016-11-05 – 2016-11-09 (×7): 25 mg via INTRAVENOUS
  Filled 2016-11-05 (×7): qty 1

## 2016-11-05 MED ORDER — DICYCLOMINE HCL 20 MG PO TABS
20.0000 mg | ORAL_TABLET | Freq: Two times a day (BID) | ORAL | Status: DC
  Administered 2016-11-06 – 2016-11-11 (×11): 20 mg via ORAL
  Filled 2016-11-05 (×12): qty 1

## 2016-11-05 MED ORDER — METOCLOPRAMIDE HCL 5 MG/ML IJ SOLN
10.0000 mg | Freq: Four times a day (QID) | INTRAMUSCULAR | Status: DC
  Administered 2016-11-05 – 2016-11-11 (×22): 10 mg via INTRAVENOUS
  Filled 2016-11-05 (×22): qty 2

## 2016-11-05 MED ORDER — DEXTROSE-NACL 5-0.45 % IV SOLN: INTRAVENOUS | Status: DC

## 2016-11-05 MED ORDER — SODIUM CHLORIDE 0.9 % IV SOLN: INTRAVENOUS | Status: DC

## 2016-11-05 MED ORDER — SODIUM CHLORIDE 0.9 % IV SOLN
INTRAVENOUS | Status: DC
  Administered 2016-11-05: 11:00:00 via INTRAVENOUS

## 2016-11-05 MED ORDER — ENOXAPARIN SODIUM 40 MG/0.4ML ~~LOC~~ SOLN
40.0000 mg | SUBCUTANEOUS | Status: DC
  Administered 2016-11-05 – 2016-11-10 (×6): 40 mg via SUBCUTANEOUS
  Filled 2016-11-05 (×6): qty 0.4

## 2016-11-05 MED ORDER — DEXTROSE 5 % IV SOLN
1.0000 g | INTRAVENOUS | Status: AC
  Administered 2016-11-06 – 2016-11-08 (×3): 1 g via INTRAVENOUS
  Filled 2016-11-05 (×3): qty 10

## 2016-11-05 MED ORDER — ACETAMINOPHEN 325 MG PO TABS
650.0000 mg | ORAL_TABLET | Freq: Four times a day (QID) | ORAL | Status: DC | PRN
  Filled 2016-11-05: qty 2

## 2016-11-05 MED ORDER — DEXTROSE 5 % IV SOLN
1.0000 g | Freq: Once | INTRAVENOUS | Status: AC
  Administered 2016-11-05: 1 g via INTRAVENOUS
  Filled 2016-11-05: qty 10

## 2016-11-05 MED ORDER — DIPHENHYDRAMINE HCL 50 MG/ML IJ SOLN
25.0000 mg | Freq: Once | INTRAMUSCULAR | Status: AC
  Administered 2016-11-05: 25 mg via INTRAVENOUS
  Filled 2016-11-05: qty 1

## 2016-11-05 MED ORDER — METOCLOPRAMIDE HCL 5 MG/ML IJ SOLN
10.0000 mg | Freq: Once | INTRAMUSCULAR | Status: AC
  Administered 2016-11-05: 10 mg via INTRAVENOUS
  Filled 2016-11-05: qty 2

## 2016-11-05 MED ORDER — HYDRALAZINE HCL 20 MG/ML IJ SOLN
20.0000 mg | Freq: Four times a day (QID) | INTRAMUSCULAR | Status: DC | PRN
  Administered 2016-11-05 – 2016-11-07 (×4): 20 mg via INTRAVENOUS
  Filled 2016-11-05 (×4): qty 1

## 2016-11-05 MED ORDER — LISINOPRIL 5 MG PO TABS
2.5000 mg | ORAL_TABLET | Freq: Every day | ORAL | Status: DC
Start: 2016-11-05 — End: 2016-11-11
  Administered 2016-11-06 – 2016-11-11 (×6): 2.5 mg via ORAL
  Filled 2016-11-05 (×6): qty 1

## 2016-11-05 MED ORDER — DEXTROSE-NACL 5-0.45 % IV SOLN
INTRAVENOUS | Status: DC
  Administered 2016-11-05 – 2016-11-06 (×2): via INTRAVENOUS

## 2016-11-05 MED ORDER — METOPROLOL TARTRATE 12.5 MG HALF TABLET
12.5000 mg | ORAL_TABLET | Freq: Two times a day (BID) | ORAL | Status: DC
  Administered 2016-11-06: 12.5 mg via ORAL
  Filled 2016-11-05 (×2): qty 1

## 2016-11-05 MED ORDER — OXYBUTYNIN CHLORIDE 5 MG PO TABS
2.5000 mg | ORAL_TABLET | Freq: Three times a day (TID) | ORAL | Status: DC
  Administered 2016-11-05 – 2016-11-11 (×15): 2.5 mg via ORAL
  Filled 2016-11-05 (×17): qty 1

## 2016-11-05 MED ORDER — SODIUM CHLORIDE 0.9 % IV SOLN: INTRAVENOUS | Status: AC

## 2016-11-05 MED ORDER — MORPHINE SULFATE (PF) 4 MG/ML IV SOLN
4.0000 mg | Freq: Once | INTRAVENOUS | Status: AC
  Administered 2016-11-05: 4 mg via INTRAVENOUS
  Filled 2016-11-05: qty 1

## 2016-11-05 MED ORDER — KETOROLAC TROMETHAMINE 30 MG/ML IJ SOLN
30.0000 mg | Freq: Three times a day (TID) | INTRAMUSCULAR | Status: DC | PRN
  Administered 2016-11-05 – 2016-11-10 (×8): 30 mg via INTRAVENOUS
  Filled 2016-11-05 (×10): qty 1

## 2016-11-05 NOTE — ED Notes (Signed)
Pt has been made aware of UA. Will notify me when able to use restroom to collect sample.

## 2016-11-05 NOTE — ED Provider Notes (Signed)
[name] DEPT Provider Note   CSN: [id number] Arrival date & time: 11/05/16  0244     History   Chief Complaint Chief Complaint  Patient presents with  . Emesis    HPI [name] is a 26 y.o. female with past medical history of orally controlled type 1 diabetes, hypertension, asthma, anxiety, gastroparesis, presenting to the ED with persistent nonbloody, nonbilious emesis since last night. Patient with recent admission for intractable vomiting on 10/23/2016. Pt also with multiple admissions for DKA in the past few months. Patient reporting diffuse crampy abdominal pain and body aches. States she takes basal insulin which she took yesterday morning, with sliding scale insulin. She states she took her blood glucose in the evening and it was 160. Denies fever or chills, urinary symptoms, vaginal bleeding or discharge, chest pain, cough, or any other complaints today.   The history is provided by the patient.    Past Medical History:  Diagnosis Date  . Anxiety   . Arthritis   . Asthma   . Diabetes mellitus 2007   IDDM.  poorly controlled, multiple admits with DKA  . Gallstones   . Gastroparesis   . Heart murmur   . Hepatic steatosis 11/26/2014   and hepatomegaly  . Hypertension    NOT CURRENTLY ON ANY BP MED  . Liver mass 11/26/2014  . Pancreatitis, acute 11/26/2014    Patient Active Problem List   Diagnosis Date Noted  . Leukocytosis 10/24/2016  . N&V (nausea and vomiting) 10/23/2016  . DKA, type 1 ([place]) 09/26/2016  . Thrombocytosis ([employer]) 07/08/2016  . Candiduria, asymptomatic 06/23/2016  . Hyponatremia 06/13/2016  . Hematuria 05/13/2016  . UTI (urinary tract infection) 01/22/2016  . Diarrhea 11/09/2015  . Acute urinary retention   . GERD (gastroesophageal reflux disease) 07/10/2015  . Depression with anxiety 07/10/2015  . Gastroparesis 06/22/2015  . Altered mental status 06/22/2015  . Volume depletion 06/10/2015  .  Protein-calorie malnutrition, severe 06/10/2015  . Hyperglycemia   . Elevated bilirubin   . Hematemesis with nausea   . Intractable nausea and vomiting 05/28/2015  . Abdominal pain in female   . Abdominal pain 05/24/2015  . Hypertension 05/24/2015  . Dehydration   . Chronic diastolic heart failure ([place]) 04/11/2015  . Hematemesis 04/08/2015  . DKA (diabetic ketoacidoses) ([place]) 03/22/2015  . S/P laparoscopic cholecystectomy 02/11/2015  . Postextubation stridor   . Pancreatitis, acute 11/26/2014  . Volume overload 11/26/2014  . Hypokalemia 11/26/2014  . Hepatic steatosis 11/26/2014  . Liver mass 11/26/2014  . Sepsis ([place]) 11/25/2014  . Sinus tachycardia 11/25/2014  . Hypomagnesemia 11/25/2014  . Hypophosphatemia 11/25/2014  . Elevated LFTs 11/24/2014  . AKI (acute kidney injury) ([place]) 11/24/2014  . Migraine headache 11/24/2014  . Asthma 06/29/2012  . Uncontrolled type 1 diabetes mellitus ([place]) 06/19/2010  . Goiter, unspecified 06/19/2010    Past Surgical History:  Procedure Laterality Date  . CHOLECYSTECTOMY N/A 02/11/2015   Procedure: LAPAROSCOPIC CHOLECYSTECTOMY WITH INTRAOPERATIVE CHOLANGIOGRAM;  Surgeon: Greer Pickerel, MD;  Location: [facility];  Service: General;  Laterality: N/A;  . ESOPHAGOGASTRODUODENOSCOPY (EGD) WITH PROPOFOL Left 09/20/2014   Procedure: ESOPHAGOGASTRODUODENOSCOPY (EGD) WITH PROPOFOL;  Surgeon: Arta Silence, MD;  Location: [facility] END[facility]COPY;  Service: Endoscopy;  Laterality: Left;  . WISDOM TOOTH EXTRACTION      OB History    Gravida Para Term Preterm AB Living   2 1 0 1 1 1    SAB TAB Ectopic Multiple Live Births   0 1 0 0 1  Home Medications    Prior to Admission medications   Medication Sig Start Date End Date Taking? Authorizing Provider  albuterol (PROVENTIL HFA;VENTOLIN HFA) 108 (90 Base) MCG/ACT inhaler Inhale 1-2 puffs into the lungs every 6 (six) hours as needed for wheezing or shortness of breath.   Yes [provider]    dicyclomine (BENTYL) 20 MG tablet Take 1 tablet (20 mg total) by mouth 2 (two) times daily. 07/13/16  Yes Providence Lanius A, PA-C  insulin aspart (NOVOLOG FLEXPEN) 100 UNIT/ML FlexPen Inject 1-10 Units into the skin 3 (three) times daily with meals. Pt uses 1 unit for every 10 grams of carbs with meals and snacks for BS greater than 50.   Yes [provider]  insulin glargine (LANTUS) 100 UNIT/ML injection Inject 0.25 mLs (25 Units total) into the skin daily. 09/29/16  Yes Domenic Polite, MD  lisinopril (PRINIVIL,ZESTRIL) 2.5 MG tablet Take 2.5 mg by mouth daily. 10/08/16  Yes [provider]  metoCLOPramide (REGLAN) 10 MG tablet Take 1 tablet (10 mg total) by mouth every 6 (six) hours as needed for nausea. 07/23/16  Yes Orlie Dakin, MD  metoprolol tartrate (LOPRESSOR) 25 MG tablet Take 0.5 tablets (12.5 mg total) by mouth 2 (two) times daily. 05/10/16  Yes Mariel Aloe, MD  ondansetron (ZOFRAN) 4 MG tablet Take 1 tablet (4 mg total) by mouth every 6 (six) hours. Patient taking differently: Take 4 mg by mouth every 8 (eight) hours as needed for nausea.  07/13/16  Yes Providence Lanius A, PA-C  oxybutynin (DITROPAN) 5 MG tablet Take 0.5 tablets (2.5 mg total) by mouth 3 (three) times daily. 10/29/16  Yes Donne Hazel, MD  RESTASIS MULTIDOSE 0.05 % ophthalmic emulsion Place 1 drop into both eyes every 12 (twelve) hours. 09/08/16  Yes [provider]  Vitamin D, Ergocalciferol, (DRISDOL) 50000 units CAPS capsule Take 50,000 Units by mouth once a week. On fridays 05/26/16  Yes [provider]  glucagon (GLUCAGON EMERGENCY) 1 MG injection Inject 1 mg into the vein once as needed. Patient taking differently: Inject 1 mg into the vein once as needed. High BS 06/14/16   Kerney Elbe, DO    Family History Family History  Problem Relation Age of Onset  . Heart disease Maternal Grandmother   . Heart disease Maternal Grandfather   . Diabetes Mother   .  Hyperlipidemia Mother   . Hypertension Father   . Heart disease Father   . Hypertension Paternal Grandmother   . Cancer Paternal Grandfather     Social History Social History  Substance Use Topics  . Smoking status: Never Smoker  . Smokeless tobacco: Never Used  . Alcohol use No     Allergies   Peanut-containing drug products; Food; and Ultram [tramadol]   Review of Systems Review of Systems  Constitutional: Negative for chills and fever.  Respiratory: Negative for cough.   Cardiovascular: Negative for chest pain.  Gastrointestinal: Positive for abdominal pain, nausea and vomiting. Negative for blood in stool and constipation.  Genitourinary: Negative for dysuria, vaginal bleeding and vaginal discharge.  Musculoskeletal: Positive for myalgias.  Allergic/Immunologic: Positive for immunocompromised state (T1DM).  All other systems reviewed and are negative.    Physical Exam Updated Vital Signs BP (!) 153/107 (BP Location: Right Arm)   Pulse (!) 138   Temp 98.4 F (36.9 C) (Oral)   Resp (!) 26   SpO2 100%   Physical Exam  Constitutional: She is oriented to person, place, and time.  She appears well-developed and well-nourished.  Patient constantly moving around in bed and appears uncomfortable.  HENT:  Head: Normocephalic and atraumatic.  Mouth/Throat: Oropharynx is clear and moist.  Eyes: Pupils are equal, round, and reactive to light. Conjunctivae and EOM are normal.  Neck: Normal range of motion. Neck supple. No tracheal deviation present.  Cardiovascular: Regular rhythm, normal heart sounds and intact distal pulses.   tachycardic  Pulmonary/Chest: Effort normal and breath sounds normal. No stridor. No respiratory distress. She has no wheezes. She has no rales.  Abdominal: Soft. Bowel sounds are normal. She exhibits no distension and no mass. There is tenderness (generalized). There is no rebound and no guarding.  Musculoskeletal: Normal range of motion.    Lymphadenopathy:    She has no cervical adenopathy.  Neurological: She is alert and oriented to person, place, and time.  Skin: Skin is warm.  Psychiatric: She has a normal mood and affect. Her behavior is normal.  Nursing note and vitals reviewed.    ED Treatments / Results  Labs (all labs ordered are listed, but only abnormal results are displayed) Labs Reviewed  CBC - Abnormal; Notable for the following:       Result Value   WBC 19.6 (*)    Platelets 494 (*)    All other components within normal limits  URINALYSIS, ROUTINE W REFLEX MICROSCOPIC - Abnormal; Notable for the following:    APPearance CLOUDY (*)    Glucose, UA >=500 (*)    Hgb urine dipstick SMALL (*)    Ketones, ur 80 (*)    Protein, ur 30 (*)    Leukocytes, UA LARGE (*)    Bacteria, UA RARE (*)    All other components within normal limits  COMPREHENSIVE METABOLIC PANEL - Abnormal; Notable for the following:    CO2 16 (*)    Glucose, Bld 392 (*)    Total Protein 8.3 (*)    AST 13 (*)    ALT 11 (*)    Total Bilirubin 1.3 (*)    Anion gap 19 (*)    All other components within normal limits  BLOOD GAS, VENOUS - Abnormal; Notable for the following:    pCO2, Ven 30.1 (*)    pO2, Ven 45.4 (*)    Bicarbonate 13.0 (*)    Acid-base deficit 12.6 (*)    All other components within normal limits  RAPID URINE DRUG SCREEN, HOSP PERFORMED - Abnormal; Notable for the following:    Tetrahydrocannabinol POSITIVE (*)    All other components within normal limits  CBG MONITORING, ED - Abnormal; Notable for the following:    Glucose-Capillary 366 (*)    All other components within normal limits  CBG MONITORING, ED - Abnormal; Notable for the following:    Glucose-Capillary 410 (*)    All other components within normal limits  CBG MONITORING, ED - Abnormal; Notable for the following:    Glucose-Capillary 291 (*)    All other components within normal limits  CBG MONITORING, ED - Abnormal; Notable for the following:     Glucose-Capillary 262 (*)    All other components within normal limits  CULTURE, BLOOD (ROUTINE X 2)  CULTURE, BLOOD (ROUTINE X 2)  LIPASE, BLOOD  I-STAT BETA HCG BLOOD, ED ([facility], [facility], [facility] ONLY)  I-STAT CG4 LACTIC ACID, ED    EKG  EKG Interpretation None       Radiology No results found.  Procedures Procedures (including critical care time) CRITICAL CARE Performed by: [place] N  Virgina Jock  ?  Total critical care time: 45 minutes  Critical care time was exclusive of separately billable procedures and treating other patients.  Critical care was necessary to treat or prevent imminent or life-threatening deterioration.  Critical care was time spent personally by me on the following activities: development of treatment plan with patient and/or surrogate as well as nursing, discussions with consultants, evaluation of patient's response to treatment, examination of patient, obtaining history from patient or surrogate, ordering and performing treatments and interventions, ordering and review of laboratory studies, ordering and review of radiographic studies, pulse oximetry and re-evaluation of patient's condition.  Medications Ordered in ED Medications  insulin regular (NOVOLIN R,HUMULIN R) 100 Units in sodium chloride 0.9 % 100 mL (1 Units/mL) infusion (4 Units/hr Intravenous Rate/Dose Change 11/05/16 1321)  dextrose 5 %-0.45 % sodium chloride infusion (not administered)  sodium chloride 0.9 % bolus 1,000 mL (0 mLs Intravenous Stopped 11/05/16 1139)    And  0.9 %  sodium chloride infusion ( Intravenous New Bag/Given 11/05/16 1124)  Influenza vac split quadrivalent PF (FLUARIX) injection 0.5 mL (not administered)  metoCLOPramide (REGLAN) injection 10 mg (10 mg Intravenous Given 11/05/16 0750)  sodium chloride 0.9 % bolus 1,000 mL (0 mLs Intravenous Stopped 11/05/16 1128)  ketorolac (TORADOL) 30 MG/ML injection 30 mg (30 mg Intravenous Given 11/05/16 0750)  promethazine (PHENERGAN)  injection 25 mg (25 mg Intravenous Given 11/05/16 1032)  morphine 4 MG/ML injection 4 mg (4 mg Intravenous Given 11/05/16 1032)  diphenhydrAMINE (BENADRYL) injection 25 mg (25 mg Intravenous Given 11/05/16 1032)  cefTRIAXone (ROCEPHIN) 1 g in dextrose 5 % 50 mL IVPB (0 g Intravenous Stopped 11/05/16 1138)  potassium chloride 10 mEq in 100 mL IVPB (10 mEq Intravenous New Bag/Given 11/05/16 1230)     Initial Impression / Assessment and Plan / ED Course  I have reviewed the triage vital signs and the nursing notes.  Pertinent labs & imaging results that were available during my care of the patient were reviewed by me and considered in my medical decision making (see chart for details).    Pt w DKA, presenting w emesis since last night. Pt w multiple ED visits for DKA secondary to uncontrolled type 1 diabetes. On arrival, blood glucose 366 and patient is tachycardic. Exam with diffuse abdominal tenderness and patient appearing uncomfortable.Some delay in labs as patient was very difficult to obtain IV then second delay in lab. WBC 19.6 with evidence of UTI on UA. Anion gap 19, bicarbonate 16 pH 7.26, CO2 30.1. Blood cultures obtained, Rocephin started for UTI. Pt afebrile. Patient given multiple liters of fluid, insulin drip initiated w DKA protocol. Pts pain and nausea improved. Patient remaining tachycardic and tachypneic throughout ED course. Hospitalist consultated for admission, Dr. Shanon Brow accepting.   Patient discussed with and seen by Dr. Billy Fischer.  The patient appears reasonably stabilized for admission considering the current resources, flow, and capabilities available in the ED at this time, and I doubt any other [facility] requiring further screening and/or treatment in the ED prior to admission.  Final Clinical Impressions(s) / ED Diagnoses   Final diagnoses:  Diabetic ketoacidosis without coma associated with type 1 diabetes mellitus [facility])    New Prescriptions New Prescriptions   No  medications on file     Russo, [place] N, PA-C 11/05/16 1402    Gareth Morgan, MD 11/05/16 2120

## 2016-11-05 NOTE — Progress Notes (Signed)
11/05/16  Paged Dr Shanon Brow d/t patients BP 74/39 waiting for a response.

## 2016-11-05 NOTE — Progress Notes (Signed)
Dr Shanon Brow notifed of B/P 182/120 and sinus tach 129 and c/o generalized pain.

## 2016-11-05 NOTE — Progress Notes (Signed)
HR sustaining in the 120s/130s while patient is sleeping. Paged Dr. Maudie Mercury. He states he thinks this is from the DKA. Continue to monitor.

## 2016-11-05 NOTE — H&P (Signed)
History and Physical    [name] NTZ:[id number] DOB: 1990/11/20 DOA: 11/05/2016  PCP: Vicenta Aly, FNP  Patient coming from:  home  Chief Complaint:   n/v  HPI: [name] is a 26 y.o. female with medical history significant of frequent admissions for DKA and uncontrolled diabetes comes in with several days of persistent nausea and vomiting and sugars getting high.  She reports "pain all over".  Denies fever.  No cough.  No sob.  No urinary symptoms.  Says she is compliant with her insulin.  Smokes marijuana frequently.  Denies any other drug use.  No diarrhea.  No foci of pain anywhere.  Pt found to be in DKA and referred for admission for such.  This is her 10th admission in the last 6 months per EPIC.  Review of Systems: As per HPI otherwise 10 point review of systems negative.   Past Medical History:  Diagnosis Date  . Anxiety   . Arthritis   . Asthma   . Diabetes mellitus 2007   IDDM.  poorly controlled, multiple admits with DKA  . Gallstones   . Gastroparesis   . Heart murmur   . Hepatic steatosis 11/26/2014   and hepatomegaly  . Hypertension    NOT CURRENTLY ON ANY BP MED  . Liver mass 11/26/2014  . Pancreatitis, acute 11/26/2014    Past Surgical History:  Procedure Laterality Date  . CHOLECYSTECTOMY N/A 02/11/2015   Procedure: LAPAROSCOPIC CHOLECYSTECTOMY WITH INTRAOPERATIVE CHOLANGIOGRAM;  Surgeon: Greer Pickerel, MD;  [facility];  Service: General;  Laterality: N/A;  . ESOPHAGOGASTRODUODENOSCOPY (EGD) WITH PROPOFOL Left 09/20/2014   Procedure: ESOPHAGOGASTRODUODENOSCOPY (EGD) WITH PROPOFOL;  Surgeon: Arta Silence, MD;  Location: [facility] ENDOSCOPY;  Service: Endoscopy;  Laterality: Left;  . WISDOM TOOTH EXTRACTION       reports that she has never smoked. She has never used smokeless tobacco. She reports that she uses drugs, including Marijuana. She reports that she does not drink alcohol.  Allergies  Allergen Reactions  . Peanut-Containing Drug  Products Swelling and Other (See Comments)    Reaction:  Swelling of mouth and lips   . 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   Reaction:  Swelling of mouth and lips   . Ultram [Tramadol] Itching    Family History  Problem Relation Age of Onset  . Heart disease Maternal Grandmother   . Heart disease Maternal Grandfather   . Diabetes Mother   . Hyperlipidemia Mother   . Hypertension Father   . Heart disease Father   . Hypertension Paternal Grandmother   . Cancer Paternal Grandfather     Prior to Admission medications   Medication Sig Start Date End Date Taking? Authorizing Provider  albuterol (PROVENTIL HFA;VENTOLIN HFA) 108 (90 Base) MCG/ACT inhaler Inhale 1-2 puffs into the lungs every 6 (six) hours as needed for wheezing or shortness of breath.   Yes [provider]  dicyclomine (BENTYL) 20 MG tablet Take 1 tablet (20 mg total) by mouth 2 (two) times daily. 07/13/16  Yes Providence Lanius A, PA-C  insulin aspart (NOVOLOG FLEXPEN) 100 UNIT/ML FlexPen Inject 1-10 Units into the skin 3 (three) times daily with meals. Pt uses 1 unit for every 10 grams of carbs with meals and snacks for BS greater than 50.   Yes [provider]  insulin glargine (LANTUS) 100 UNIT/ML injection Inject 0.25 mLs (25 Units total) into the skin daily. 09/29/16  Yes Domenic Polite, MD  lisinopril (  PRINIVIL,ZESTRIL) 2.5 MG tablet Take 2.5 mg by mouth daily. 10/08/16  Yes [provider]  metoCLOPramide (REGLAN) 10 MG tablet Take 1 tablet (10 mg total) by mouth every 6 (six) hours as needed for nausea. 07/23/16  Yes Orlie Dakin, MD  metoprolol tartrate (LOPRESSOR) 25 MG tablet Take 0.5 tablets (12.5 mg total) by mouth 2 (two) times daily. 05/10/16  Yes Mariel Aloe, MD  ondansetron (ZOFRAN) 4 MG tablet Take 1 tablet (4 mg total) by mouth every 6 (six) hours. Patient taking differently: Take 4 mg by mouth every 8 (eight) hours as needed for nausea.   07/13/16  Yes Providence Lanius A, PA-C  oxybutynin (DITROPAN) 5 MG tablet Take 0.5 tablets (2.5 mg total) by mouth 3 (three) times daily. 10/29/16  Yes Donne Hazel, MD  RESTASIS MULTIDOSE 0.05 % ophthalmic emulsion Place 1 drop into both eyes every 12 (twelve) hours. 09/08/16  Yes [provider]  Vitamin D, Ergocalciferol, (DRISDOL) 50000 units CAPS capsule Take 50,000 Units by mouth once a week. On fridays 05/26/16  Yes [provider]  glucagon (GLUCAGON EMERGENCY) 1 MG injection Inject 1 mg into the vein once as needed. Patient taking differently: Inject 1 mg into the vein once as needed. High BS 06/14/16   [name] [place], [place]    Physical Exam: Vitals:   11/05/16 9476 11/05/16 0949 11/05/16 1047 11/05/16 1127  BP:  (!) 174/116 (!) 152/105 (!) 153/107  Pulse:  (!) 130 (!) 139 (!) 138  Resp:  15 (!) 26 (!) 26  Temp:      TempSrc:      SpO2: 100% 100% 100% 100%      Constitutional: NAD, calm, comfortable Vitals:   11/05/16 0652 11/05/16 0949 11/05/16 1047 11/05/16 1127  BP:  (!) 174/116 (!) 152/105 (!) 153/107  Pulse:  (!) 130 (!) 139 (!) 138  Resp:  15 (!) 26 (!) 26  Temp:      TempSrc:      SpO2: 100% 100% 100% 100%   Eyes: PERRL, lids and conjunctivae normal ENMT: Mucous membranes are moist. Posterior pharynx clear of any exudate or lesions.Normal dentition.  Neck: normal, supple, no masses, no thyromegaly Respiratory: clear to auscultation bilaterally, no wheezing, no crackles. Normal respiratory effort. No accessory muscle use.  Cardiovascular: Regular rate and rhythm, no murmurs / rubs / gallops. No extremity edema. 2+ pedal pulses. No carotid bruits.  Abdomen: no tenderness, no masses palpated. No hepatosplenomegaly. Bowel sounds positive.  Musculoskeletal: no clubbing / cyanosis. No joint deformity upper and lower extremities. Good ROM, no contractures. Normal muscle tone.  Skin: no rashes, lesions, ulcers. No induration Neurologic: CN 2-12 grossly  intact. Sensation intact, DTR normal. Strength 5/5 in all 4.  Psychiatric: Normal judgment and insight. Alert and oriented x 3. Normal mood.    Labs on Admission: I have personally reviewed following labs and imaging studies  CBC:  Recent Labs Lab 11/05/16 0731  WBC 19.6*  HGB 14.7  HCT 43.5  MCV 86.7  PLT 546*   Basic Metabolic Panel:  Recent Labs Lab 11/05/16 0856  NA 139  K 4.4  CL 104  CO2 16*  GLUCOSE 392*  BUN 16  CREATININE 0.76  CALCIUM 9.3   GFR: Estimated Creatinine Clearance: 91.2 mL/min (by C-G formula based on SCr of 0.76 mg/dL). Liver Function Tests:  Recent Labs Lab 11/05/16 0856  AST 13*  ALT 11*  ALKPHOS 106  BILITOT 1.3*  PROT 8.3*  ALBUMIN 4.6  Recent Labs Lab 11/05/16 0856  LIPASE 16    CBG:  Recent Labs Lab 10/29/16 1703 11/05/16 0632 11/05/16 1043 11/05/16 1152  GLUCAP 281* 366* 410* 291*   Urine analysis:    Component Value Date/Time   COLORURINE YELLOW 11/05/2016 0937   APPEARANCEUR CLOUDY (A) 11/05/2016 0937   LABSPEC 1.026 11/05/2016 0937   PHURINE 5.0 11/05/2016 0937   GLUCOSEU >=500 (A) 11/05/2016 0937   GLUCOSEU >=1000 11/07/2012 1205   HGBUR SMALL (A) 11/05/2016 0937   BILIRUBINUR NEGATIVE 11/05/2016 0937   KETONESUR 80 (A) 11/05/2016 0937   PROTEINUR 30 (A) 11/05/2016 0937   UROBILINOGEN 0.2 11/24/2014 1045   NITRITE NEGATIVE 11/05/2016 0937   LEUKOCYTESUR LARGE (A) 11/05/2016 0937   Old chart reviewed Case discussed with edp All labs reviewed   Assessment/Plan 26 yo known type 1 diabetic female with n/v and DKA  Principal Problem:   DKA (diabetic ketoacidoses) ([place])- insulin drip.  Bmp q 4 hours.  Hourly glucose ck.  Ivf.  Mildly acidotic with gap of 19.  Should resolve by tomorrow.  Active Problems:   UTI (urinary tract infection) r/o - cover with rocephin pending culture data   Chronic diastolic heart failure ([place])- stable at this time   Hypertension- stable   Intractable nausea and  vomiting- given prn phenergan and reglan.  Ck uds   Gastroparesis- reglan iv   DVT prophylaxis:  lovenox Code Status:  full Family Communication:  none  Disposition Plan:  Per day team Consults called:  none Admission status:  observation   Keilen Kahl A MD [facility]  If 7PM-7AM, please contact night-coverage [email or web address] Password [email or web address]  11/05/2016, 12:46 PM

## 2016-11-05 NOTE — ED Triage Notes (Signed)
Pt was not feeling good yesterday and then started vomiting this morning about 2 am and has not been able to stop since  Pt has gastroporesis

## 2016-11-06 DIAGNOSIS — Z888 Allergy status to other drugs, medicaments and biological substances status: Secondary | ICD-10-CM | POA: Diagnosis not present

## 2016-11-06 DIAGNOSIS — K3184 Gastroparesis: Secondary | ICD-10-CM | POA: Diagnosis present

## 2016-11-06 DIAGNOSIS — E1010 Type 1 diabetes mellitus with ketoacidosis without coma: Secondary | ICD-10-CM | POA: Diagnosis present

## 2016-11-06 DIAGNOSIS — I10 Essential (primary) hypertension: Secondary | ICD-10-CM | POA: Diagnosis not present

## 2016-11-06 DIAGNOSIS — Z9119 Patient's noncompliance with other medical treatment and regimen: Secondary | ICD-10-CM | POA: Diagnosis not present

## 2016-11-06 DIAGNOSIS — N390 Urinary tract infection, site not specified: Secondary | ICD-10-CM | POA: Diagnosis present

## 2016-11-06 DIAGNOSIS — D899 Disorder involving the immune mechanism, unspecified: Secondary | ICD-10-CM | POA: Diagnosis present

## 2016-11-06 DIAGNOSIS — Z8249 Family history of ischemic heart disease and other diseases of the circulatory system: Secondary | ICD-10-CM | POA: Diagnosis not present

## 2016-11-06 DIAGNOSIS — I110 Hypertensive heart disease with heart failure: Secondary | ICD-10-CM | POA: Diagnosis present

## 2016-11-06 DIAGNOSIS — Z23 Encounter for immunization: Secondary | ICD-10-CM | POA: Diagnosis not present

## 2016-11-06 DIAGNOSIS — F329 Major depressive disorder, single episode, unspecified: Secondary | ICD-10-CM | POA: Diagnosis present

## 2016-11-06 DIAGNOSIS — I5032 Chronic diastolic (congestive) heart failure: Secondary | ICD-10-CM | POA: Diagnosis present

## 2016-11-06 DIAGNOSIS — Z794 Long term (current) use of insulin: Secondary | ICD-10-CM | POA: Diagnosis not present

## 2016-11-06 DIAGNOSIS — Z79899 Other long term (current) drug therapy: Secondary | ICD-10-CM | POA: Diagnosis not present

## 2016-11-06 DIAGNOSIS — Z91010 Allergy to peanuts: Secondary | ICD-10-CM | POA: Diagnosis not present

## 2016-11-06 DIAGNOSIS — K219 Gastro-esophageal reflux disease without esophagitis: Secondary | ICD-10-CM | POA: Diagnosis present

## 2016-11-06 DIAGNOSIS — R112 Nausea with vomiting, unspecified: Secondary | ICD-10-CM | POA: Diagnosis not present

## 2016-11-06 DIAGNOSIS — K760 Fatty (change of) liver, not elsewhere classified: Secondary | ICD-10-CM | POA: Diagnosis present

## 2016-11-06 DIAGNOSIS — E1043 Type 1 diabetes mellitus with diabetic autonomic (poly)neuropathy: Secondary | ICD-10-CM | POA: Diagnosis present

## 2016-11-06 DIAGNOSIS — R160 Hepatomegaly, not elsewhere classified: Secondary | ICD-10-CM | POA: Diagnosis present

## 2016-11-06 DIAGNOSIS — R339 Retention of urine, unspecified: Secondary | ICD-10-CM | POA: Diagnosis not present

## 2016-11-06 DIAGNOSIS — F1290 Cannabis use, unspecified, uncomplicated: Secondary | ICD-10-CM | POA: Diagnosis present

## 2016-11-06 DIAGNOSIS — F419 Anxiety disorder, unspecified: Secondary | ICD-10-CM | POA: Diagnosis present

## 2016-11-06 DIAGNOSIS — Z833 Family history of diabetes mellitus: Secondary | ICD-10-CM | POA: Diagnosis not present

## 2016-11-06 DIAGNOSIS — Z9049 Acquired absence of other specified parts of digestive tract: Secondary | ICD-10-CM | POA: Diagnosis not present

## 2016-11-06 LAB — GLUCOSE, CAPILLARY
GLUCOSE-CAPILLARY: 105 mg/dL — AB (ref 65–99)
GLUCOSE-CAPILLARY: 108 mg/dL — AB (ref 65–99)
GLUCOSE-CAPILLARY: 115 mg/dL — AB (ref 65–99)
GLUCOSE-CAPILLARY: 136 mg/dL — AB (ref 65–99)
GLUCOSE-CAPILLARY: 141 mg/dL — AB (ref 65–99)
GLUCOSE-CAPILLARY: 156 mg/dL — AB (ref 65–99)
GLUCOSE-CAPILLARY: 184 mg/dL — AB (ref 65–99)
GLUCOSE-CAPILLARY: 86 mg/dL (ref 65–99)
Glucose-Capillary: 194 mg/dL — ABNORMAL HIGH (ref 65–99)
Glucose-Capillary: 93 mg/dL (ref 65–99)
Glucose-Capillary: 177 mg/dL — ABNORMAL HIGH (ref 65–99)
Glucose-Capillary: 182 mg/dL — ABNORMAL HIGH (ref 65–99)

## 2016-11-06 LAB — BASIC METABOLIC PANEL
ANION GAP: 10 (ref 5–15)
ANION GAP: 7 (ref 5–15)
Anion gap: 9 (ref 5–15)
BUN: 17 mg/dL (ref 6–20)
BUN: 16 mg/dL (ref 6–20)
BUN: 16 mg/dL (ref 6–20)
CALCIUM: 9.4 mg/dL (ref 8.9–10.3)
CALCIUM: 8.4 mg/dL — AB (ref 8.9–10.3)
CHLORIDE: 109 mmol/L (ref 101–111)
CO2: 18 mmol/L — ABNORMAL LOW (ref 22–32)
CO2: 19 mmol/L — ABNORMAL LOW (ref 22–32)
CO2: 20 mmol/L — AB (ref 22–32)
Calcium: 8.7 mg/dL — ABNORMAL LOW (ref 8.9–10.3)
Chloride: 107 mmol/L (ref 101–111)
Chloride: 109 mmol/L (ref 101–111)
Creatinine, Ser: 0.67 mg/dL (ref 0.44–1.00)
Creatinine, Ser: 0.54 mg/dL (ref 0.44–1.00)
Creatinine, Ser: 0.53 mg/dL (ref 0.44–1.00)
GFR calc Af Amer: >60 mL/min (ref >60)
GFR calc non Af Amer: >60 mL/min (ref >60)
GLUCOSE: 153 mg/dL — AB (ref 65–99)
GLUCOSE: 120 mg/dL — AB (ref 65–99)
Glucose, Bld: 225 mg/dL — ABNORMAL HIGH (ref 65–99)
POTASSIUM: 3.8 mmol/L (ref 3.5–5.1)
Potassium: 3.8 mmol/L (ref 3.5–5.1)
Potassium: 3.8 mmol/L (ref 3.5–5.1)
SODIUM: 135 mmol/L (ref 135–145)
SODIUM: 135 mmol/L (ref 135–145)
SODIUM: 138 mmol/L (ref 135–145)

## 2016-11-06 MED ORDER — METOPROLOL TARTRATE 25 MG PO TABS
25.0000 mg | ORAL_TABLET | Freq: Two times a day (BID) | ORAL | Status: DC
  Administered 2016-11-06 – 2016-11-11 (×10): 25 mg via ORAL
  Filled 2016-11-06 (×10): qty 1

## 2016-11-06 MED ORDER — PANTOPRAZOLE SODIUM 40 MG IV SOLR
40.0000 mg | Freq: Two times a day (BID) | INTRAVENOUS | Status: DC
  Administered 2016-11-06 – 2016-11-11 (×10): 40 mg via INTRAVENOUS
  Filled 2016-11-06 (×11): qty 40

## 2016-11-06 MED ORDER — INSULIN GLARGINE 100 UNIT/ML ~~LOC~~ SOLN
20.0000 [IU] | Freq: Every day | SUBCUTANEOUS | Status: DC
  Administered 2016-11-06 – 2016-11-09 (×4): 20 [IU] via SUBCUTANEOUS
  Filled 2016-11-06 (×4): qty 0.2

## 2016-11-06 MED ORDER — FLUOXETINE HCL 10 MG PO CAPS
10.0000 mg | ORAL_CAPSULE | Freq: Every day | ORAL | Status: DC
  Administered 2016-11-07 – 2016-11-11 (×5): 10 mg via ORAL
  Filled 2016-11-06 (×6): qty 1

## 2016-11-06 MED ORDER — INSULIN ASPART 100 UNIT/ML ~~LOC~~ SOLN
0.0000 [IU] | Freq: Three times a day (TID) | SUBCUTANEOUS | Status: DC
  Administered 2016-11-06 (×2): 3 [IU] via SUBCUTANEOUS
  Administered 2016-11-07: 2 [IU] via SUBCUTANEOUS
  Administered 2016-11-07: 5 [IU] via SUBCUTANEOUS
  Administered 2016-11-07: 3 [IU] via SUBCUTANEOUS
  Administered 2016-11-08: 5 [IU] via SUBCUTANEOUS
  Administered 2016-11-08 (×2): 3 [IU] via SUBCUTANEOUS
  Administered 2016-11-09: 5 [IU] via SUBCUTANEOUS
  Administered 2016-11-10 (×2): 2 [IU] via SUBCUTANEOUS
  Administered 2016-11-11: 5 [IU] via SUBCUTANEOUS

## 2016-11-06 MED ORDER — SODIUM CHLORIDE 0.9 % IV BOLUS (SEPSIS): 500.0000 mL | Freq: Once | INTRAVENOUS | Status: DC

## 2016-11-06 MED ORDER — KETOROLAC TROMETHAMINE 30 MG/ML IJ SOLN
30.0000 mg | Freq: Once | INTRAMUSCULAR | Status: AC
  Administered 2016-11-06: 30 mg via INTRAVENOUS

## 2016-11-06 MED ORDER — PANTOPRAZOLE SODIUM 40 MG PO TBEC: 40.0000 mg | DELAYED_RELEASE_TABLET | Freq: Two times a day (BID) | ORAL | Status: DC

## 2016-11-06 MED ORDER — HYDROCODONE-ACETAMINOPHEN 7.5-325 MG PO TABS: 1.0000 | ORAL_TABLET | ORAL | Status: DC | PRN

## 2016-11-06 MED ORDER — SODIUM CHLORIDE 0.9 % IV BOLUS (SEPSIS)
1000.0000 mL | Freq: Once | INTRAVENOUS | Status: AC
  Administered 2016-11-06: 1000 mL via INTRAVENOUS

## 2016-11-06 MED ORDER — INSULIN ASPART 100 UNIT/ML ~~LOC~~ SOLN
0.0000 [IU] | Freq: Every day | SUBCUTANEOUS | Status: DC
  Administered 2016-11-08: 2 [IU] via SUBCUTANEOUS

## 2016-11-06 MED ORDER — SODIUM CHLORIDE 0.9 % IV BOLUS (SEPSIS)
500.0000 mL | Freq: Once | INTRAVENOUS | Status: AC
  Administered 2016-11-06: 500 mL via INTRAVENOUS

## 2016-11-06 MED ORDER — SODIUM CHLORIDE 0.9 % IV SOLN
INTRAVENOUS | Status: DC
  Administered 2016-11-06 – 2016-11-10 (×8): via INTRAVENOUS

## 2016-11-06 MED ORDER — INSULIN GLARGINE 100 UNIT/ML ~~LOC~~ SOLN
20.0000 [IU] | Freq: Every day | SUBCUTANEOUS | Status: DC
  Filled 2016-11-06: qty 0.2

## 2016-11-06 MED ORDER — HYDROCODONE-ACETAMINOPHEN 5-325 MG PO TABS
1.0000 | ORAL_TABLET | ORAL | Status: DC | PRN
  Administered 2016-11-06 – 2016-11-09 (×8): 2 via ORAL
  Filled 2016-11-06 (×9): qty 2

## 2016-11-06 MED ORDER — ONDANSETRON HCL 4 MG/2ML IJ SOLN
4.0000 mg | Freq: Four times a day (QID) | INTRAMUSCULAR | Status: DC | PRN
  Administered 2016-11-06 – 2016-11-10 (×7): 4 mg via INTRAVENOUS
  Filled 2016-11-06 (×7): qty 2

## 2016-11-06 MED ORDER — OXYCODONE HCL 5 MG PO TABS
5.0000 mg | ORAL_TABLET | Freq: Once | ORAL | Status: AC
  Administered 2016-11-06: 5 mg via ORAL
  Filled 2016-11-06: qty 1

## 2016-11-06 MED ORDER — HYOSCYAMINE SULFATE 0.125 MG SL SUBL
0.2500 mg | SUBLINGUAL_TABLET | Freq: Once | SUBLINGUAL | Status: AC
  Administered 2016-11-06: 0.25 mg via SUBLINGUAL
  Filled 2016-11-06: qty 2

## 2016-11-06 NOTE — Progress Notes (Addendum)
Pt's HR 140s to 150s. Pain 10/10. Vomiting 154ml green emesis even after 25mg  IV phenergan. Paged K. Schorr. IV zofran, PO vicodin, 1L NS bolus & SL levsin ordered and given. Continue to monitor.

## 2016-11-06 NOTE — Progress Notes (Addendum)
PROGRESS NOTE                                                                                                                                                                                                             Patient Demographics:    [name], is a 26 y.o. female, DOB - 1990/12/12, CWC:[id number]  Admit date - 11/05/2016   Admitting Physician Phillips Grout, MD  Outpatient Primary MD for the patient is Vicenta Aly, [place]  LOS - 0    Chief Complaint  Patient presents with  . Emesis      Brief Narrative    26 y.o. female with medical history significant of frequent admissions for DKA and uncontrolled diabetes comes in with several days of persistent nausea and vomiting and sugars getting high, she was found in mild DKA,  This is her 10th admission in the last 6 months per EPIC   Subjective:    [name] today has, No headache, No chest pain, She reports some abdominal pain, had nausea and vomiting this a.m..    Assessment  & Plan :    Principal Problem:   DKA (diabetic ketoacidoses) ([place]) Active Problems:   Chronic diastolic heart failure (HCC)   Hypertension   Intractable nausea and vomiting   Gastroparesis   UTI (urinary tract infection)   Uncontrolled diabetes mellitus with DKA - Patient presents with some nausea, vomiting,with mild anion gap of 19, and bicarbonate of 16, she was admitted to ICU, kept on insulin drip, IV fluids, her anion gap has closed, she was transitioned to Lantus 20 units subcutaneous daily and insulin sliding scale, unfortunately she is still having nausea and vomiting and not able to tolerate anything oral, anion gap remains close, will continue with IV fluids sliding scale and Lantus and will check BMP in a.m.[name] - Transferred to telemetry  Urinary retention - 625 mL output with in and out, she is encouraged to urinate, will continue with when necessary in and out, 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  Nausea and vomiting - Most likely due to gastroparesis as previous imaging were significant for distention of the stomach and the duodenum. -she is on scheduled Reglan  UTI - She is with abnormal urinalysis, but this always been the case in the past, giving  her symptoms I will continue with Rocephin pending cultures  History of GERD - Continue with PPI  Hypertension - Blood pressure elevated,some tachycardia, will increase her metoprolol to her home dose dose of 25 mg twice a day, continue with lisinopril.  History of depression - We'll start on Prozac   Code Status : full code  Family Communication  : None at bedside  Disposition Plan  : Home when stable  Consults  :  none  Procedures  : none  DVT Prophylaxis  :  Lovenox   Lab Results  Component Value Date   PLT 494 (H) 11/05/2016    Antibiotics  :   Anti-infectives    Start     Dose/Rate Route Frequency Ordered Stop   11/06/16 1000  cefTRIAXone (ROCEPHIN) 1 g in dextrose 5 % 50 mL IVPB     1 g 100 mL/hr over 30 Minutes Intravenous Every 24 hours 11/05/16 1525     11/05/16 1030  cefTRIAXone (ROCEPHIN) 1 g in dextrose 5 % 50 mL IVPB     1 g 100 mL/hr over 30 Minutes Intravenous  Once 11/05/16 1019 11/05/16 1138        Objective:   Vitals:   11/06/16 1225 11/06/16 1300 11/06/16 1350 11/06/16 1400  BP: (!) 148/64 (!) 92/35 134/86 (!) 148/95  Pulse: (!) 136 (!) 124 (!) 121 (!) 119  Resp: 19 11 16 12   Temp:      TempSrc:      SpO2: 99% 98% 100% 100%  Weight:      Height:        Wt Readings from Last 3 Encounters:  11/05/16 52.9 kg (116 lb 10 oz)  10/25/16 55.8 kg (123 lb 0.3 oz)  09/27/16 54.5 kg (120 lb 2.4 oz)     Intake/Output Summary (Last 24 hours) at 11/06/16 1506 Last data filed at 11/06/16 1331  Gross per 24 hour  Intake          2791.56 ml  Output             2625 ml  Net           166.56 ml     Physical  Exam  Awake Alert, Oriented X 3, Symmetrical Chest wall movement, Good air movement bilaterally, CTAB tachycardic,No Gallops,Rubs or new Murmurs, No Parasternal Heave +ve B.Sounds, Abd Soft,No rebound - guarding or rigidity. No Cyanosis, Clubbing or edema, No new Rash or bruise      Data Review:    CBC  Recent Labs Lab 11/05/16 0731  WBC 19.6*  HGB 14.7  HCT 43.5  PLT 494*  MCV 86.7  MCH 29.3  MCHC 33.8  RDW 14.5    Chemistries   Recent Labs Lab 11/05/16 0856 11/05/16 1546 11/05/16 1942 11/06/16 0131 11/06/16 0638 11/06/16 1156  NA 139 135 133* 135 135 138  K 4.4 4.5 4.8 3.8 3.8 3.8  CL 104 106 106 107 109 109  CO2 16* 17* 16* 18* 19* 20*  GLUCOSE 392* 179* 263* 153* 120* 225*  BUN 16 14 17 17 16 16   CREATININE 0.76 0.61 0.83 0.67 0.54 0.53  CALCIUM 9.3 9.3 8.9 9.4 8.4* 8.7*  MG  --  1.7  --   --   --   --   AST 13*  --   --   --   --   --   ALT 11*  --   --   --   --   --  ALKPHOS 106  --   --   --   --   --   BILITOT 1.3*  --   --   --   --   --    ------------------------------------------------------------------------------------------------------------------ No results for input(s): CHOL, HDL, LDLCALC, TRIG, CHOLHDL, LDLDIRECT in the last 72 hours.  Lab Results  Component Value Date   HGBA1C 9.1 (H) 11/05/2016   ------------------------------------------------------------------------------------------------------------------ No results for input(s): TSH, T4TOTAL, T3FREE, THYROIDAB in the last 72 hours.  Invalid input(s): FREET3 ------------------------------------------------------------------------------------------------------------------ No results for input(s): VITAMINB12, FOLATE, FERRITIN, TIBC, IRON, RETICCTPCT in the last 72 hours.  Coagulation profile No results for input(s): INR, PROTIME in the last 168 hours.  No results for input(s): DDIMER in the last 72 hours.  Cardiac Enzymes No results for input(s): CKMB, TROPONINI, MYOGLOBIN  in the last 168 hours.  Invalid input(s): CK ------------------------------------------------------------------------------------------------------------------    Component Value Date/Time   BNP 6.9 07/11/2015 0353    Inpatient Medications  Scheduled Meds: . dicyclomine  20 mg Oral BID  . enoxaparin (LOVENOX) injection  40 mg Subcutaneous Q24H  . insulin aspart  0-15 Units Subcutaneous TID WC  . insulin aspart  0-5 Units Subcutaneous QHS  . insulin glargine  20 Units Subcutaneous Daily  . lisinopril  2.5 mg Oral Daily  . metoCLOPramide (REGLAN) injection  10 mg Intravenous Q6H  . metoprolol tartrate  25 mg Oral BID  . oxybutynin  2.5 mg Oral TID   Continuous Infusions: . sodium chloride 100 mL/hr at 11/06/16 1147  . cefTRIAXone (ROCEPHIN)  IV Stopped (11/06/16 1006)  . insulin (NOVOLIN-R) infusion Stopped (11/06/16 0830)   PRN Meds:.acetaminophen, albuterol, hydrALAZINE, HYDROcodone-acetaminophen, ketorolac, ondansetron (ZOFRAN) IV, promethazine  Micro Results Recent Results (from the past 240 hour(s))  MRSA PCR Screening     Status: None   Collection Time: 11/05/16  3:34 PM  Result Value Ref Range Status   MRSA by PCR NEGATIVE NEGATIVE Final    Comment:        The GeneXpert MRSA Assay (FDA approved for NASAL specimens only), is one component of a comprehensive MRSA colonization surveillance program. It is not intended to diagnose MRSA infection nor to guide or monitor treatment for MRSA infections.     Radiology Reports Ct Abdomen Pelvis W Contrast  Result Date: 10/23/2016 CLINICAL DATA:  Nausea and vomiting. EXAM: CT ABDOMEN AND PELVIS WITH CONTRAST TECHNIQUE: Multidetector CT imaging of the abdomen and pelvis was performed using the standard protocol following bolus administration of intravenous contrast. CONTRAST:  153mL ISOVUE-300 IOPAMIDOL (ISOVUE-300) INJECTION 61% COMPARISON:  CT abdomen pelvis dated February 20, 2016. FINDINGS: Lower chest: No acute  abnormality. Hepatobiliary: Four hyperenhancing lesions within the liver are again seen, previously characterized as hepatic adenomas. Status post cholecystectomy. No biliary dilatation. Pancreas: Unremarkable. No pancreatic ductal dilatation or surrounding inflammatory changes. Spleen: Normal in size without focal abnormality. Adrenals/Urinary Tract: Adrenal glands are unremarkable. Kidneys are normal, without renal calculi, focal lesion, or hydronephrosis. The bladder is markedly distended. Air within the bladder is likely related to recent instrumentation. Stomach/Bowel: The stomach and duodenum are prominently distended. Appendix appears normal. No evidence of bowel wall thickening, distention, or inflammatory changes. Vascular/Lymphatic: No significant vascular findings are present. Probable mixing artifact in the portal system. No enlarged abdominal or pelvic lymph nodes. Reproductive: Uterus and bilateral adnexa are unremarkable. Right corpus luteum. Other: Trace free fluid in the pelvis is likely physiologic. No pneumoperitoneum. Musculoskeletal: No acute or significant osseous findings. IMPRESSION: 1. No acute intra-abdominal process. 2. Prominent  distention of the stomach and duodenum, which may be related to patient's history of gastroparesis. 3. Marked bladder distention. Air within the bladder is likely related to recent instrumentation. No hydronephrosis. 4. Four hyperenhancing lesions within the liver are similar in size to prior study, previously characterized as hepatic adenomas. Electronically Signed   By: Titus Dubin M.D.   On: 10/23/2016 20:26   Dg Abd Acute W/chest  Result Date: 10/23/2016 CLINICAL DATA:  Nausea and vomiting since this morning. History of gastroparesis. EXAM: DG ABDOMEN ACUTE W/ 1V CHEST COMPARISON:  07/23/2016 FINDINGS: Bowel gas pattern is within normal limits. Relatively gasless abdomen consistent with the history of vomiting. Surgical clips related to previous  cholecystectomy. No free air. One-view chest is normal. IMPRESSION: No acute finding. Relatively gasless abdomen consistent with the clinical history of vomiting. Electronically Signed   By: Nelson Chimes M.D.   On: 10/23/2016 14:12    Time Spent in minutes  25 minutes   Parisha Beaulac M.D on 11/06/2016 at 3:06 PM  Between 7am to 7pm - Pager - [phone]  After 7pm go to [email or web address] - password [facility]  [facility] -  Office  [phone]

## 2016-11-06 NOTE — Progress Notes (Signed)
HR increased from 120s/130s to 130s/140s. Paged K. Schorr. New order for 500bolus ordered and given. Continue to monitor.

## 2016-11-06 NOTE — Progress Notes (Addendum)
Patient has low urine output. Bladder scanner showed >973ml unine. Patient also requested pain medicine. Paged K. Schorr. New order for one time I&O cath. I&O catheter performed with 1564ml output. New order for one time dose 30mg  IV toradol received & given. Continue to monitor.

## 2016-11-06 NOTE — Progress Notes (Signed)
HR now staying in 120s and patient resting comfortably at this time. Continue to monitor.

## 2016-11-06 NOTE — Progress Notes (Signed)
Inpatient Diabetes Program Recommendations  AACE/ADA: New Consensus Statement on Inpatient Glycemic Control (2015)  Target Ranges:  Prepandial:   less than 140 mg/dL      Peak postprandial:   less than 180 mg/dL (1-2 hours)      Critically ill patients:  140 - 180 mg/dL   Lab Results  Component Value Date   GLUCAP 136 (H) 11/06/2016   HGBA1C 9.1 (H) 11/05/2016    Review of Glycemic Control  Diabetes history: DM1 Outpatient Diabetes medications: Lantus 25 units QD, Novolog 1-10 units tidwc  Current orders for Inpatient glycemic control: Lantus 20 units QD  Will need meal coverage insulin since pt is Type 1. Pt stated she would f/u with Dr. Hartford Poli and make appt for her diabetes management. No appt thus far. May need reduction in basal insulin.  Inpatient Diabetes Program Recommendations:    Per notes, pt taking less Lantus at home (12 units) Add Novolog 0-9 units tidwc and hs Add Novolog 4 units tidwc if pt eats >50% meal. Psych referral.  Diabetes Coordinators have seen pt multiple times in her frequent admissions for DKA. Recommend psych consult for depression and understanding of why pt is not taking her insulin.  Per 07/17/2016 note: Spoke with pt's Mother on phone this morning. Mom states she is very concerned about her daughter's multiple admissions. States her daughter is depressed and is not on antidepressants at this time. She previously was on something for depression/anxiety, and stopped taking it d/t feeling bad.  Mom states pt has flat affect at home. Witnesses pt checking blood sugar and giving insulin at times. Mom states pt would benefit from psych intervention. Mom is willing to speak with MD about same.   Continue to follow while inpatient.  Thank you. Lorenda Peck, RD, LDN, CDE Inpatient Diabetes Coordinator [phone]

## 2016-11-06 NOTE — Care Management Note (Signed)
Case Management Note  Patient Details  Name: [name] MRN: [id number] Date of Birth: 01/30/1990  Subjective/Objective:                  Nausea and emesis  Action/Plan: Date:  November 06, 2016 Chart reviewed for concurrent status and case management needs.  Will continue to follow patient progress.  Discharge Planning: following for needs  Expected discharge date: [id number]  Velva Harman, BSN, [place], [place]   Expected Discharge Date:   (unknown)               Expected Discharge Plan:  Home/Self Care  In-House Referral:     Discharge planning Services  CM Consult  Post Acute Care Choice:    Choice offered to:     DME Arranged:    DME Agency:     HH Arranged:    [place] Agency:     Status of Service:  In process, will continue to follow  If discussed at Long Length of Stay Meetings, dates discussed:    Additional Comments:  Leeroy Cha, RN 11/06/2016, 8:34 AM

## 2016-11-07 DIAGNOSIS — R112 Nausea with vomiting, unspecified: Secondary | ICD-10-CM

## 2016-11-07 LAB — GLUCOSE, CAPILLARY
GLUCOSE-CAPILLARY: 138 mg/dL — AB (ref 65–99)
GLUCOSE-CAPILLARY: 117 mg/dL — AB (ref 65–99)
Glucose-Capillary: 216 mg/dL — ABNORMAL HIGH (ref 65–99)
Glucose-Capillary: 165 mg/dL — ABNORMAL HIGH (ref 65–99)

## 2016-11-07 LAB — BASIC METABOLIC PANEL
ANION GAP: 11 (ref 5–15)
BUN: 15 mg/dL (ref 6–20)
CHLORIDE: 108 mmol/L (ref 101–111)
CO2: 17 mmol/L — AB (ref 22–32)
Calcium: 8.4 mg/dL — ABNORMAL LOW (ref 8.9–10.3)
Creatinine, Ser: 0.6 mg/dL (ref 0.44–1.00)
GFR calc Af Amer: >60 mL/min (ref >60)
GFR calc non Af Amer: >60 mL/min (ref >60)
GLUCOSE: 259 mg/dL — AB (ref 65–99)
POTASSIUM: 3.8 mmol/L (ref 3.5–5.1)
Sodium: 136 mmol/L (ref 135–145)

## 2016-11-07 LAB — CBC
HCT: 34.3 % — ABNORMAL LOW (ref 36.0–46.0)
Hemoglobin: 11.3 g/dL — ABNORMAL LOW (ref 12.0–15.0)
MCH: 28.2 pg (ref 26.0–34.0)
MCHC: 32.9 g/dL (ref 30.0–36.0)
MCV: 85.5 fL (ref 78.0–100.0)
PLATELETS: 370 10*3/uL (ref 150–400)
RBC: 4.01 MIL/uL (ref 3.87–5.11)
RDW: 14.5 % (ref 11.5–15.5)
WBC: 7.7 10*3/uL (ref 4.0–10.5)

## 2016-11-07 MED ORDER — HYOSCYAMINE SULFATE 0.125 MG SL SUBL
0.2500 mg | SUBLINGUAL_TABLET | Freq: Once | SUBLINGUAL | Status: AC
  Administered 2016-11-07: 0.25 mg via SUBLINGUAL
  Filled 2016-11-07: qty 2

## 2016-11-07 MED ORDER — SODIUM BICARBONATE 650 MG PO TABS
650.0000 mg | ORAL_TABLET | Freq: Two times a day (BID) | ORAL | Status: DC
  Administered 2016-11-07 – 2016-11-11 (×9): 650 mg via ORAL
  Filled 2016-11-07 (×9): qty 1

## 2016-11-08 LAB — CBC
HCT: 39.2 % (ref 36.0–46.0)
Hemoglobin: 13.2 g/dL (ref 12.0–15.0)
MCH: 29 pg (ref 26.0–34.0)
MCHC: 33.7 g/dL (ref 30.0–36.0)
MCV: 86.2 fL (ref 78.0–100.0)
Platelets: 314 10*3/uL (ref 150–400)
RBC: 4.55 MIL/uL (ref 3.87–5.11)
RDW: 14.7 % (ref 11.5–15.5)
WBC: 6.9 10*3/uL (ref 4.0–10.5)

## 2016-11-08 LAB — BASIC METABOLIC PANEL
Anion gap: 12 (ref 5–15)
BUN: 18 mg/dL (ref 6–20)
CALCIUM: 9 mg/dL (ref 8.9–10.3)
CHLORIDE: 107 mmol/L (ref 101–111)
CO2: 19 mmol/L — AB (ref 22–32)
CREATININE: 0.7 mg/dL (ref 0.44–1.00)
GFR calc Af Amer: >60 mL/min (ref >60)
GFR calc non Af Amer: >60 mL/min (ref >60)
Glucose, Bld: 217 mg/dL — ABNORMAL HIGH (ref 65–99)
Potassium: 3.9 mmol/L (ref 3.5–5.1)
Sodium: 138 mmol/L (ref 135–145)

## 2016-11-08 LAB — GLUCOSE, CAPILLARY
GLUCOSE-CAPILLARY: 181 mg/dL — AB (ref 65–99)
Glucose-Capillary: 248 mg/dL — ABNORMAL HIGH (ref 65–99)
Glucose-Capillary: 151 mg/dL — ABNORMAL HIGH (ref 65–99)
Glucose-Capillary: 232 mg/dL — ABNORMAL HIGH (ref 65–99)

## 2016-11-08 NOTE — Progress Notes (Signed)
PROGRESS NOTE                                                                                                                                                                                                             Patient Demographics:    [name], is a 26 y.o. female, DOB - [birth year], ZOX:[id number]  Admit date - 11/05/2016   Admitting Physician Phillips Grout, MD  Outpatient Primary MD for the patient is Vicenta Aly, [place]  LOS - 2    Chief Complaint  Patient presents with  . Emesis      Brief Narrative    26 y.o. female with medical history significant of frequent admissions for DKA and uncontrolled diabetes comes in with several days of persistent nausea and vomiting and sugars getting high, she was found in mild DKA,  This is her 10th admission in the last 6 months per EPIC   Subjective:    Niema Armetta afebrile, no CP, no SOB and no hemoptysis. Continue to experience nausea and intermittent episodes of vomiting.    Assessment  & Plan :    Principal Problem:   DKA (diabetic ketoacidoses) ([place]) Active Problems:   Chronic diastolic heart failure (HCC)   Hypertension   Intractable nausea and vomiting   Gastroparesis   UTI (urinary tract infection)   Uncontrolled diabetes mellitus with DKA -A1C 9.1 -patient with 10th admission/ED visit in the last 6 months -DKA resolved -will continue SSI and lantus -started on her chronic bicarbonate tablet for chronic metabolic acidosis. -follow clinical response   Nausea and vomiting -due to gastroparesis -will continue use of reglan and PRN antiemetics -patient encourage to try small amounts multiple times a day   UTI and urinary retention symptoms  - She is with abnormal urinalysis, but this always been the case in the past. -patient will complete an extra day of IV rocephin and then will stop antibiotics -continue in and out cath as needed   -continue ditropan     History of GERD -continue PPI  Hypertension -BP better controlled -will continue low dose lisinopril and home dose of metoprolol  History of depression -no SI or hallucinations -continue prozac   Code Status : full code  Family Communication  : None at bedside  Disposition Plan  : Home when stable  Consults  :  none  Procedures  : none  DVT Prophylaxis  :  Lovenox   Lab Results  Component Value Date   PLT 370 11/07/2016    Antibiotics  :   Anti-infectives    Start     Dose/Rate Route Frequency Ordered Stop   11/06/16 1000  cefTRIAXone (ROCEPHIN) 1 g in dextrose 5 % 50 mL IVPB     1 g 100 mL/hr over 30 Minutes Intravenous Every 24 hours 11/05/16 1525     11/05/16 1030  cefTRIAXone (ROCEPHIN) 1 g in dextrose 5 % 50 mL IVPB     1 g 100 mL/hr over 30 Minutes Intravenous  Once 11/05/16 1019 11/05/16 1138        Objective:   Vitals:   11/07/16 1135 11/07/16 1200 11/07/16 1402 11/07/16 2136  BP:  (!) 156/106 (!) 111/50 128/74  Pulse:  (!) 123 (!) 126 (!) 128  Resp:  19 20 20   Temp: 99 F (37.2 C)  99.3 F (37.4 C) 100 F (37.8 C)  TempSrc: Oral  Oral Oral  SpO2:  99% 97% 97%  Weight:   57.8 kg (127 lb 6.8 oz)   Height:   5\' 4"  (1.626 m)     Wt Readings from Last 3 Encounters:  11/07/16 57.8 kg (127 lb 6.8 oz)  10/25/16 55.8 kg (123 lb 0.3 oz)  09/27/16 54.5 kg (120 lb 2.4 oz)     Intake/Output Summary (Last 24 hours) at 11/08/16 0117 Last data filed at 11/07/16 1546  Gross per 24 hour  Intake          2006.67 ml  Output              250 ml  Net          1756.67 ml     Physical Exam  GEN: AAOX3, reporting nausea and episode of vomiting this morning. No CP, no SOB. Cardiovascular: tachycardic, no rubs, no gallops Resp: good air movement bilaterally, no wheezing, no crackles Abd: soft, ND, positive BS, diffuse vague discomfort on palpation Extremities: no edema, no cyanosis    Data Review:    CBC  Recent Labs Lab 11/05/16 0731  11/07/16 0905  WBC 19.6* 7.7  HGB 14.7 11.3*  HCT 43.5 34.3*  PLT 494* 370  MCV 86.7 85.5  MCH 29.3 28.2  MCHC 33.8 32.9  RDW 14.5 14.5    Chemistries   Recent Labs Lab 11/05/16 0856 11/05/16 1546 11/05/16 1942 11/06/16 0131 11/06/16 0638 11/06/16 1156 11/07/16 0905  NA 139 135 133* 135 135 138 136  K 4.4 4.5 4.8 3.8 3.8 3.8 3.8  CL 104 106 106 107 109 109 108  CO2 16* 17* 16* 18* 19* 20* 17*  GLUCOSE 392* 179* 263* 153* 120* 225* 259*  BUN 16 14 17 17 16 16 15   CREATININE 0.76 0.61 0.83 0.67 0.54 0.53 0.60  CALCIUM 9.3 9.3 8.9 9.4 8.4* 8.7* 8.4*  MG  --  1.7  --   --   --   --   --   AST 13*  --   --   --   --   --   --   ALT 11*  --   --   --   --   --   --   ALKPHOS 106  --   --   --   --   --   --   BILITOT 1.3*  --   --   --   --   --   --  Lab Results  Component Value Date   HGBA1C 9.1 (H) 11/05/2016    Cardiac Enzymes No results for input(s): CKMB, TROPONINI, MYOGLOBIN in the last 168 hours.  Invalid input(s): CK ------------------------------------------------------------------------------------------------------------------    Component Value Date/Time   BNP 6.9 07/11/2015 0353    Inpatient Medications  Scheduled Meds: . dicyclomine  20 mg Oral BID  . enoxaparin (LOVENOX) injection  40 mg Subcutaneous Q24H  . FLUoxetine  10 mg Oral Daily  . insulin aspart  0-15 Units Subcutaneous TID WC  . insulin aspart  0-5 Units Subcutaneous QHS  . insulin glargine  20 Units Subcutaneous Daily  . lisinopril  2.5 mg Oral Daily  . metoCLOPramide (REGLAN) injection  10 mg Intravenous Q6H  . metoprolol tartrate  25 mg Oral BID  . oxybutynin  2.5 mg Oral TID  . pantoprazole (PROTONIX) IV  40 mg Intravenous Q12H  . sodium bicarbonate  650 mg Oral BID   Continuous Infusions: . sodium chloride 100 mL/hr at 11/07/16 2024  . cefTRIAXone (ROCEPHIN)  IV Stopped (11/07/16 1013)  . insulin (NOVOLIN-R) infusion Stopped (11/06/16 0830)   PRN  Meds:.acetaminophen, albuterol, hydrALAZINE, HYDROcodone-acetaminophen, ketorolac, ondansetron (ZOFRAN) IV, promethazine  Micro Results Recent Results (from the past 240 hour(s))  Blood culture (routine x 2)     Status: None (Preliminary result)   Collection Time: 11/05/16 11:00 AM  Result Value Ref Range Status   Specimen Description BLOOD RIGHT WRIST  Final   Special Requests IN PEDIATRIC BOTTLE Blood Culture adequate volume  Final   Culture   Final    NO GROWTH 2 DAYS Performed at [facility], [place] [address], [place], [place] [address]    Report Status PENDING  Incomplete  Blood culture (routine x 2)     Status: None (Preliminary result)   Collection Time: 11/05/16 11:10 AM  Result Value Ref Range Status   Specimen Description BLOOD LEFT ANTECUBITAL  Final   Special Requests   Final    BOTTLES DRAWN AEROBIC AND ANAEROBIC Blood Culture adequate volume   Culture   Final    NO GROWTH 2 DAYS Performed at [facility], [place] [address] [place], [place] [address]    Report Status PENDING  Incomplete  MRSA PCR Screening     Status: None   Collection Time: 11/05/16  3:34 PM  Result Value Ref Range Status   MRSA by PCR NEGATIVE NEGATIVE Final    Comment:        The GeneXpert MRSA Assay (FDA approved for NASAL specimens only), is one component of a comprehensive MRSA colonization surveillance program. It is not intended to diagnose MRSA infection nor to guide or monitor treatment for MRSA infections.     Radiology Reports Ct Abdomen Pelvis W Contrast  Result Date: 10/23/2016 CLINICAL DATA:  Nausea and vomiting. EXAM: CT ABDOMEN AND PELVIS WITH CONTRAST TECHNIQUE: Multidetector CT imaging of the abdomen and pelvis was performed using the standard protocol following bolus administration of intravenous contrast. CONTRAST:  140mL ISOVUE-300 IOPAMIDOL (ISOVUE-300) INJECTION 61% COMPARISON:  CT abdomen pelvis dated February 20, 2016. FINDINGS: Lower chest: No acute  abnormality. Hepatobiliary: Four hyperenhancing lesions within the liver are again seen, previously characterized as hepatic adenomas. Status post cholecystectomy. No biliary dilatation. Pancreas: Unremarkable. No pancreatic ductal dilatation or surrounding inflammatory changes. Spleen: Normal in size without focal abnormality. Adrenals/Urinary Tract: Adrenal glands are unremarkable. Kidneys are normal, without renal calculi, focal lesion, or hydronephrosis. The bladder is markedly distended. Air within the  bladder is likely related to recent instrumentation. Stomach/Bowel: The stomach and duodenum are prominently distended. Appendix appears normal. No evidence of bowel wall thickening, distention, or inflammatory changes. Vascular/Lymphatic: No significant vascular findings are present. Probable mixing artifact in the portal system. No enlarged abdominal or pelvic lymph nodes. Reproductive: Uterus and bilateral adnexa are unremarkable. Right corpus luteum. Other: Trace free fluid in the pelvis is likely physiologic. No pneumoperitoneum. Musculoskeletal: No acute or significant osseous findings. IMPRESSION: 1. No acute intra-abdominal process. 2. Prominent distention of the stomach and duodenum, which may be related to patient's history of gastroparesis. 3. Marked bladder distention. Air within the bladder is likely related to recent instrumentation. No hydronephrosis. 4. Four hyperenhancing lesions within the liver are similar in size to prior study, previously characterized as hepatic adenomas. Electronically Signed   By: Titus Dubin M.D.   On: 10/23/2016 20:26   Dg Abd Acute W/chest  Result Date: 10/23/2016 CLINICAL DATA:  Nausea and vomiting since this morning. History of gastroparesis. EXAM: DG ABDOMEN ACUTE W/ 1V CHEST COMPARISON:  07/23/2016 FINDINGS: Bowel gas pattern is within normal limits. Relatively gasless abdomen consistent with the history of vomiting. Surgical clips related to previous  cholecystectomy. No free air. One-view chest is normal. IMPRESSION: No acute finding. Relatively gasless abdomen consistent with the clinical history of vomiting. Electronically Signed   By: Nelson Chimes M.D.   On: 10/23/2016 14:12    Time Spent in minutes  25 minutes   Barton Dubois M.D on 11/08/2016 at 1:17 AM  Between 7am to 7pm - Pager - [phone]  After 7pm go to [email or web address] - password [facility]  [facility] -  Office  [phone]

## 2016-11-08 NOTE — Progress Notes (Signed)
PROGRESS NOTE                                                                                                                                                                                                             Patient Demographics:    Stefanie Argueta, is a 26 y.o. female, DOB - September 28, 1990, GYJ:[id number]  Admit date - 11/05/2016   Admitting Physician Phillips Grout, MD  Outpatient Primary MD for the patient is Vicenta Aly, [place]  LOS - 2    Chief Complaint  Patient presents with  . Emesis      Brief Narrative    26 y.o. female with medical history significant of frequent admissions for DKA and uncontrolled diabetes comes in with several days of persistent nausea and vomiting and sugars getting high, she was found in mild DKA,  This is her 10th admission in the last 6 months per EPIC   Subjective:    Kariann Hadaway afebrile, no CP, no SOB. Positive nausea and 2 episodes of vomiting so far.    Assessment  & Plan :    Principal Problem:   DKA (diabetic ketoacidoses) ([place]) Active Problems:   Chronic diastolic heart failure (HCC)   Hypertension   Intractable nausea and vomiting   Gastroparesis   UTI (urinary tract infection)   Uncontrolled diabetes mellitus with DKA -A1C  9.1 -patient with 10th admission/ED visit in the last 6 months -DKA resolved and Anion Gap has remained closed. -will continue SSI and lantus -continue bicarbonate tablet for chronic metabolic acidosis. -watch overnight while advancing diet further and trying to controlling symptoms. Hopefully stable to go home tomorrow.  Nausea and vomiting -due to gastroparesis -Continue Reglan and PRN antiemetics -patient encourage to follow small meals multiple times a day   UTI and urinary retention symptoms  -She presented with abnormal urinalysis, but this has always been the case in the past. -patient has completed empirically treatment with  Rocephin -continue in and out cath as needed   -continue ditropan   History of GERD -continue PPI  Hypertension -BP controlled -will continue low dose lisinopril and home dose of metoprolol  History of depression -mood stable -no SI -continue prozac.    Code Status : full code  Family Communication  : None at bedside  Disposition Plan  : Home when stable  Consults  :  none  Procedures  : none  DVT Prophylaxis  :  Lovenox   Lab Results  Component Value Date   PLT 314 11/08/2016    Antibiotics  :   Anti-infectives    Start     Dose/Rate Route Frequency Ordered Stop   11/06/16 1000  cefTRIAXone (ROCEPHIN) 1 g in dextrose 5 % 50 mL IVPB     1 g 100 mL/hr over 30 Minutes Intravenous Every 24 hours 11/05/16 1525 11/08/16 0952   11/05/16 1030  cefTRIAXone (ROCEPHIN) 1 g in dextrose 5 % 50 mL IVPB     1 g 100 mL/hr over 30 Minutes Intravenous  Once 11/05/16 1019 11/05/16 1138        Objective:   Vitals:   11/07/16 1402 11/07/16 2136 11/08/16 0612 11/08/16 1500  BP: (!) 111/50 128/74 (!) 149/95 (!) 132/91  Pulse: (!) 126 (!) 128  98  Resp: 20 20 20 16   Temp: 99.3 F (37.4 C) 100 F (37.8 C) 98.5 F (36.9 C) 98.9 F (37.2 C)  TempSrc: Oral Oral Oral Oral  SpO2: 97% 97% 100% 98%  Weight: 57.8 kg (127 lb 6.8 oz)     Height: 5\' 4"  (1.626 m)       Wt Readings from Last 3 Encounters:  11/07/16 57.8 kg (127 lb 6.8 oz)  10/25/16 55.8 kg (123 lb 0.3 oz)  09/27/16 54.5 kg (120 lb 2.4 oz)    Intake/Output Summary (Last 24 hours) at 11/08/16 2141 Last data filed at 11/08/16 1851  Gross per 24 hour  Intake             2720 ml  Output                5 ml  Net             2715 ml    Physical Exam GEN: AAOX3, no fever, no CP, no SOB. Still nauseated and with episode of vomiting early today. DKA resolved and anion gap has remained closed. Cardiovascular: mild sinus tachycardia, no rubs, no gallops.  Resp: CTA bilaterally  Abd: soft, positive BS, no distension,  no guarding. Extremities: no edema, no cyanosis     Data Review:    CBC  Recent Labs Lab 11/05/16 0731 11/07/16 0905 11/08/16 0616  WBC 19.6* 7.7 6.9  HGB 14.7 11.3* 13.2  HCT 43.5 34.3* 39.2  PLT 494* 370 314  MCV 86.7 85.5 86.2  MCH 29.3 28.2 29.0  MCHC 33.8 32.9 33.7  RDW 14.5 14.5 14.7   Chemistries   Recent Labs Lab 11/05/16 0856 11/05/16 1546  11/06/16 0131 11/06/16 0638 11/06/16 1156 11/07/16 0905 11/08/16 0616  NA 139 135  < > 135 135 138 136 138  K 4.4 4.5  < > 3.8 3.8 3.8 3.8 3.9  CL 104 106  < > 107 109 109 108 107  CO2 16* 17*  < > 18* 19* 20* 17* 19*  GLUCOSE 392* 179*  < > 153* 120* 225* 259* 217*  BUN 16 14  < > 17 16 16 15 18   CREATININE 0.76 0.61  < > 0.67 0.54 0.53 0.60 0.70  CALCIUM 9.3 9.3  < > 9.4 8.4* 8.7* 8.4* 9.0  MG  --  1.7  --   --   --   --   --   --   AST 13*  --   --   --   --   --   --   --  ALT 11*  --   --   --   --   --   --   --   ALKPHOS 106  --   --   --   --   --   --   --   BILITOT 1.3*  --   --   --   --   --   --   --   < > = values in this interval not displayed.  Lab Results  Component Value Date   HGBA1C 9.1 (H) 11/05/2016    Cardiac Enzymes No results for input(s): CKMB, TROPONINI, MYOGLOBIN in the last 168 hours.  Invalid input(s): CK      Component Value Date/Time   BNP 6.9 07/11/2015 0353    Inpatient Medications  Scheduled Meds: . dicyclomine  20 mg Oral BID  . enoxaparin (LOVENOX) injection  40 mg Subcutaneous Q24H  . FLUoxetine  10 mg Oral Daily  . insulin aspart  0-15 Units Subcutaneous TID WC  . insulin aspart  0-5 Units Subcutaneous QHS  . insulin glargine  20 Units Subcutaneous Daily  . lisinopril  2.5 mg Oral Daily  . metoCLOPramide (REGLAN) injection  10 mg Intravenous Q6H  . metoprolol tartrate  25 mg Oral BID  . oxybutynin  2.5 mg Oral TID  . pantoprazole (PROTONIX) IV  40 mg Intravenous Q12H  . sodium bicarbonate  650 mg Oral BID   Continuous Infusions: . sodium chloride 100  mL/hr at 11/07/16 2024  . insulin (NOVOLIN-R) infusion Stopped (11/06/16 0830)   PRN Meds:.acetaminophen, albuterol, hydrALAZINE, HYDROcodone-acetaminophen, ketorolac, ondansetron (ZOFRAN) IV, promethazine  Micro Results Recent Results (from the past 240 hour(s))  Blood culture (routine x 2)     Status: None (Preliminary result)   Collection Time: 11/05/16 11:00 AM  Result Value Ref Range Status   Specimen Description BLOOD RIGHT WRIST  Final   Special Requests IN PEDIATRIC BOTTLE Blood Culture adequate volume  Final   Culture   Final    NO GROWTH 3 DAYS Performed at [facility], [place] [address] [place], [place] [address]    Report Status PENDING  Incomplete  Blood culture (routine x 2)     Status: None (Preliminary result)   Collection Time: 11/05/16 11:10 AM  Result Value Ref Range Status   Specimen Description BLOOD LEFT ANTECUBITAL  Final   Special Requests   Final    BOTTLES DRAWN AEROBIC AND ANAEROBIC Blood Culture adequate volume   Culture   Final    NO GROWTH 3 DAYS Performed at [facility], [place] [address], [place], [place] [address]    Report Status PENDING  Incomplete  MRSA PCR Screening     Status: None   Collection Time: 11/05/16  3:34 PM  Result Value Ref Range Status   MRSA by PCR NEGATIVE NEGATIVE Final    Comment:        The GeneXpert MRSA Assay (FDA approved for NASAL specimens only), is one component of a comprehensive MRSA colonization surveillance program. It is not intended to diagnose MRSA infection nor to guide or monitor treatment for MRSA infections.     Radiology Reports Ct Abdomen Pelvis W Contrast  Result Date: 10/23/2016 CLINICAL DATA:  Nausea and vomiting. EXAM: CT ABDOMEN AND PELVIS WITH CONTRAST TECHNIQUE: Multidetector CT imaging of the abdomen and pelvis was performed using the standard protocol following bolus administration of intravenous contrast. CONTRAST:  134mL ISOVUE-300 IOPAMIDOL (ISOVUE-300) INJECTION 61%  COMPARISON:  CT  abdomen pelvis dated February 20, 2016. FINDINGS: Lower chest: No acute abnormality. Hepatobiliary: Four hyperenhancing lesions within the liver are again seen, previously characterized as hepatic adenomas. Status post cholecystectomy. No biliary dilatation. Pancreas: Unremarkable. No pancreatic ductal dilatation or surrounding inflammatory changes. Spleen: Normal in size without focal abnormality. Adrenals/Urinary Tract: Adrenal glands are unremarkable. Kidneys are normal, without renal calculi, focal lesion, or hydronephrosis. The bladder is markedly distended. Air within the bladder is likely related to recent instrumentation. Stomach/Bowel: The stomach and duodenum are prominently distended. Appendix appears normal. No evidence of bowel wall thickening, distention, or inflammatory changes. Vascular/Lymphatic: No significant vascular findings are present. Probable mixing artifact in the portal system. No enlarged abdominal or pelvic lymph nodes. Reproductive: Uterus and bilateral adnexa are unremarkable. Right corpus luteum. Other: Trace free fluid in the pelvis is likely physiologic. No pneumoperitoneum. Musculoskeletal: No acute or significant osseous findings. IMPRESSION: 1. No acute intra-abdominal process. 2. Prominent distention of the stomach and duodenum, which may be related to patient's history of gastroparesis. 3. Marked bladder distention. Air within the bladder is likely related to recent instrumentation. No hydronephrosis. 4. Four hyperenhancing lesions within the liver are similar in size to prior study, previously characterized as hepatic adenomas. Electronically Signed   By: Titus Dubin M.D.   On: 10/23/2016 20:26   Dg Abd Acute W/chest  Result Date: 10/23/2016 CLINICAL DATA:  Nausea and vomiting since this morning. History of gastroparesis. EXAM: DG ABDOMEN ACUTE W/ 1V CHEST COMPARISON:  07/23/2016 FINDINGS: Bowel gas pattern is within normal limits. Relatively gasless  abdomen consistent with the history of vomiting. Surgical clips related to previous cholecystectomy. No free air. One-view chest is normal. IMPRESSION: No acute finding. Relatively gasless abdomen consistent with the clinical history of vomiting. Electronically Signed   By: Nelson Chimes M.D.   On: 10/23/2016 14:12    Time Spent in minutes  25 minutes   Barton Dubois M.D on 11/08/2016 at 9:41 PM  Between 7am to 7pm - Pager - [phone]  After 7pm go to [email or web address] - password [facility]  [facility] -  Office  [phone]

## 2016-11-09 LAB — GLUCOSE, CAPILLARY
GLUCOSE-CAPILLARY: 232 mg/dL — AB (ref 65–99)
GLUCOSE-CAPILLARY: 92 mg/dL (ref 65–99)
GLUCOSE-CAPILLARY: 81 mg/dL (ref 65–99)
Glucose-Capillary: 122 mg/dL — ABNORMAL HIGH (ref 65–99)

## 2016-11-09 MED ORDER — MORPHINE SULFATE (PF) 4 MG/ML IV SOLN
2.0000 mg | Freq: Once | INTRAVENOUS | Status: AC
  Administered 2016-11-09: 2 mg via INTRAVENOUS
  Filled 2016-11-09: qty 1

## 2016-11-09 MED ORDER — PROMETHAZINE HCL 25 MG/ML IJ SOLN
25.0000 mg | Freq: Four times a day (QID) | INTRAMUSCULAR | Status: DC | PRN
  Administered 2016-11-09 – 2016-11-10 (×3): 25 mg via INTRAVENOUS
  Filled 2016-11-09 (×3): qty 1

## 2016-11-09 MED ORDER — INSULIN GLARGINE 100 UNIT/ML ~~LOC~~ SOLN
25.0000 [IU] | Freq: Every day | SUBCUTANEOUS | Status: DC
Start: 2016-11-10 — End: 2016-11-11
  Administered 2016-11-10: 25 [IU] via SUBCUTANEOUS
  Filled 2016-11-09 (×2): qty 0.25

## 2016-11-09 MED ORDER — HYDROCODONE-ACETAMINOPHEN 5-325 MG PO TABS
2.0000 | ORAL_TABLET | ORAL | Status: DC | PRN
  Administered 2016-11-09 – 2016-11-10 (×3): 2 via ORAL
  Filled 2016-11-09 (×3): qty 2

## 2016-11-09 NOTE — Progress Notes (Signed)
PROGRESS NOTE                                                                                                                                                                                                             Patient Demographics:    [name], is a 26 y.o. female, DOB - 06/24/1990, OZH:[id number]  Admit date - 11/05/2016   Admitting Physician Phillips Grout, MD  Outpatient Primary MD for the patient is Vicenta Aly, [place]  LOS - 3    Chief Complaint  Patient presents with  . Emesis      Brief Narrative    26 y.o. female with medical history significant of frequent admissions for DKA and uncontrolled diabetes comes in with several days of persistent nausea and vomiting and sugars getting high, she was found in mild DKA,  This is her 10th admission in the last 6 months per EPIC   Subjective:    [name] afebrile, no CP, no SOB. Complaining of excruciating abd pain, nausea and vomiting. Reporting difficulty keeping things down, CBG's still high (220-240's), Anion Gap has remained closed.    Assessment  & Plan :    Principal Problem:   DKA (diabetic ketoacidoses) ([place]) Active Problems:   Chronic diastolic heart failure (HCC)   Hypertension   Intractable nausea and vomiting   Gastroparesis   UTI (urinary tract infection)   Uncontrolled diabetes mellitus with DKA -A1C  9.1, demonstrating poor control -patient with 10th admission/ED visit in the last 6 months -DKA resolved and her Anion Gap has remained closed. -CBG's elevated -will adjust lantus as per home regimen and continue SSI -patient to follow up with endocrinologist as an outpatient  -continue bicarbonate tablet for chronic metabolic acidosis. CO2 19 -will give 1 dose of IV pain meds to help controlling symptoms; explained chances of worsening gastroparesis with use of narcotics; but patient struggling to keep things down due to pain.  -Hopefully  stable to go home soon.  Nausea and vomiting -most likely due to gastroparesis -Continue Reglan and PRN antiemetics -patient encourage to follow small meals multiple times a day  -will follow clinical response  UTI and urinary retention symptoms  -She presented with abnormal urinalysis, but this has always been the case in the past. -has completed empirically treatment with Rocephin -continue in and out cath as  needed   -continue ditropan . -denies dysuria or hematuria   History of GERD -will continue PPI  Hypertension -BP controlled -will continue low dose lisinopril and home dose of metoprolol  History of depression -mood stable -no SI -continue prozac.    Code Status : full code  Family Communication  : None at bedside  Disposition Plan  : Home when stable  Consults  :  none  Procedures  : none  DVT Prophylaxis  :  Lovenox   Lab Results  Component Value Date   PLT 314 11/08/2016    Antibiotics  :   Anti-infectives    Start     Dose/Rate Route Frequency Ordered Stop   11/06/16 1000  cefTRIAXone (ROCEPHIN) 1 g in dextrose 5 % 50 mL IVPB     1 g 100 mL/hr over 30 Minutes Intravenous Every 24 hours 11/05/16 1525 11/08/16 0952   11/05/16 1030  cefTRIAXone (ROCEPHIN) 1 g in dextrose 5 % 50 mL IVPB     1 g 100 mL/hr over 30 Minutes Intravenous  Once 11/05/16 1019 11/05/16 1138        Objective:   Vitals:   11/08/16 1500 11/08/16 2145 11/09/16 0300 11/09/16 0904  BP: (!) 132/91 128/78 130/88 116/84  Pulse: 98 99 89 95  Resp: 16 16 16 18   Temp: 98.9 F (37.2 C) 98.2 F (36.8 C) 98.6 F (37 C) 98.6 F (37 C)  TempSrc: Oral Oral Oral Oral  SpO2: 98% 99% 99% 100%  Weight:      Height:        Wt Readings from Last 3 Encounters:  11/07/16 57.8 kg (127 lb 6.8 oz)  10/25/16 55.8 kg (123 lb 0.3 oz)  09/27/16 54.5 kg (120 lb 2.4 oz)    Intake/Output Summary (Last 24 hours) at 11/09/16 1329 Last data filed at 11/09/16 0400  Gross per 24 hour    Intake             3357 ml  Output                3 ml  Net             3354 ml    Physical Exam GEN: AAOX3, no CP and no SOB. Patient with active nausea, vomiting and excruciating abd pain. Has not been able to keep anything down this morning. Cardiovascular: mild sinus tachycardia, no rubs, no gallops.  Resp: good air movement, bilaterally.  Abd: soft, tender to palpation, no guarding, no distension.  Extremities: no edema, no cyanosis.    Data Review:    CBC  Recent Labs Lab 11/05/16 0731 11/07/16 0905 11/08/16 0616  WBC 19.6* 7.7 6.9  HGB 14.7 11.3* 13.2  HCT 43.5 34.3* 39.2  PLT 494* 370 314  MCV 86.7 85.5 86.2  MCH 29.3 28.2 29.0  MCHC 33.8 32.9 33.7  RDW 14.5 14.5 14.7   Chemistries   Recent Labs Lab 11/05/16 0856 11/05/16 1546  11/06/16 0131 11/06/16 0638 11/06/16 1156 11/07/16 0905 11/08/16 0616  NA 139 135  < > 135 135 138 136 138  K 4.4 4.5  < > 3.8 3.8 3.8 3.8 3.9  CL 104 106  < > 107 109 109 108 107  CO2 16* 17*  < > 18* 19* 20* 17* 19*  GLUCOSE 392* 179*  < > 153* 120* 225* 259* 217*  BUN 16 14  < > 17 16 16 15 18   CREATININE 0.76 0.61  < >  0.67 0.54 0.53 0.60 0.70  CALCIUM 9.3 9.3  < > 9.4 8.4* 8.7* 8.4* 9.0  MG  --  1.7  --   --   --   --   --   --   AST 13*  --   --   --   --   --   --   --   ALT 11*  --   --   --   --   --   --   --   ALKPHOS 106  --   --   --   --   --   --   --   BILITOT 1.3*  --   --   --   --   --   --   --   < > = values in this interval not displayed.  Lab Results  Component Value Date   HGBA1C 9.1 (H) 11/05/2016    Cardiac Enzymes No results for input(s): CKMB, TROPONINI, MYOGLOBIN in the last 168 hours.  Invalid input(s): CK      Component Value Date/Time   BNP 6.9 07/11/2015 0353    Inpatient Medications  Scheduled Meds: . dicyclomine  20 mg Oral BID  . enoxaparin (LOVENOX) injection  40 mg Subcutaneous Q24H  . FLUoxetine  10 mg Oral Daily  . insulin aspart  0-15 Units Subcutaneous TID WC  .  insulin aspart  0-5 Units Subcutaneous QHS  . [START ON 11/10/2016] insulin glargine  25 Units Subcutaneous Daily  . lisinopril  2.5 mg Oral Daily  . metoCLOPramide (REGLAN) injection  10 mg Intravenous Q6H  . metoprolol tartrate  25 mg Oral BID  .  morphine injection  2 mg Intravenous Once  . oxybutynin  2.5 mg Oral TID  . pantoprazole (PROTONIX) IV  40 mg Intravenous Q12H  . sodium bicarbonate  650 mg Oral BID   Continuous Infusions: . sodium chloride 100 mL/hr at 11/09/16 0034   PRN Meds:.acetaminophen, albuterol, hydrALAZINE, HYDROcodone-acetaminophen, ketorolac, ondansetron (ZOFRAN) IV, promethazine  Micro Results Recent Results (from the past 240 hour(s))  Blood culture (routine x 2)     Status: None (Preliminary result)   Collection Time: 11/05/16 11:00 AM  Result Value Ref Range Status   Specimen Description BLOOD RIGHT WRIST  Final   Special Requests IN PEDIATRIC BOTTLE Blood Culture adequate volume  Final   Culture   Final    NO GROWTH 4 DAYS Performed at [facility], [place] [address] [place], [place] [address]    Report Status PENDING  Incomplete  Blood culture (routine x 2)     Status: None (Preliminary result)   Collection Time: 11/05/16 11:10 AM  Result Value Ref Range Status   Specimen Description BLOOD LEFT ANTECUBITAL  Final   Special Requests   Final    BOTTLES DRAWN AEROBIC AND ANAEROBIC Blood Culture adequate volume   Culture   Final    NO GROWTH 4 DAYS Performed at [facility], [place] [address], [place], [place] [address]    Report Status PENDING  Incomplete  MRSA PCR Screening     Status: None   Collection Time: 11/05/16  3:34 PM  Result Value Ref Range Status   MRSA by PCR NEGATIVE NEGATIVE Final    Comment:        The GeneXpert MRSA Assay (FDA approved for NASAL specimens only), is one component of a comprehensive MRSA colonization surveillance program. It is not intended to diagnose MRSA  infection nor to guide or monitor treatment  for MRSA infections.     Radiology Reports Ct Abdomen Pelvis W Contrast  Result Date: 10/23/2016 CLINICAL DATA:  Nausea and vomiting. EXAM: CT ABDOMEN AND PELVIS WITH CONTRAST TECHNIQUE: Multidetector CT imaging of the abdomen and pelvis was performed using the standard protocol following bolus administration of intravenous contrast. CONTRAST:  172mL ISOVUE-300 IOPAMIDOL (ISOVUE-300) INJECTION 61% COMPARISON:  CT abdomen pelvis dated February 20, 2016. FINDINGS: Lower chest: No acute abnormality. Hepatobiliary: Four hyperenhancing lesions within the liver are again seen, previously characterized as hepatic adenomas. Status post cholecystectomy. No biliary dilatation. Pancreas: Unremarkable. No pancreatic ductal dilatation or surrounding inflammatory changes. Spleen: Normal in size without focal abnormality. Adrenals/Urinary Tract: Adrenal glands are unremarkable. Kidneys are normal, without renal calculi, focal lesion, or hydronephrosis. The bladder is markedly distended. Air within the bladder is likely related to recent instrumentation. Stomach/Bowel: The stomach and duodenum are prominently distended. Appendix appears normal. No evidence of bowel wall thickening, distention, or inflammatory changes. Vascular/Lymphatic: No significant vascular findings are present. Probable mixing artifact in the portal system. No enlarged abdominal or pelvic lymph nodes. Reproductive: Uterus and bilateral adnexa are unremarkable. Right corpus luteum. Other: Trace free fluid in the pelvis is likely physiologic. No pneumoperitoneum. Musculoskeletal: No acute or significant osseous findings. IMPRESSION: 1. No acute intra-abdominal process. 2. Prominent distention of the stomach and duodenum, which may be related to patient's history of gastroparesis. 3. Marked bladder distention. Air within the bladder is likely related to recent instrumentation. No hydronephrosis. 4. Four hyperenhancing lesions within the liver are similar  in size to prior study, previously characterized as hepatic adenomas. Electronically Signed   By: Titus Dubin M.D.   On: 10/23/2016 20:26   Dg Abd Acute W/chest  Result Date: 10/23/2016 CLINICAL DATA:  Nausea and vomiting since this morning. History of gastroparesis. EXAM: DG ABDOMEN ACUTE W/ 1V CHEST COMPARISON:  07/23/2016 FINDINGS: Bowel gas pattern is within normal limits. Relatively gasless abdomen consistent with the history of vomiting. Surgical clips related to previous cholecystectomy. No free air. One-view chest is normal. IMPRESSION: No acute finding. Relatively gasless abdomen consistent with the clinical history of vomiting. Electronically Signed   By: Nelson Chimes M.D.   On: 10/23/2016 14:12    Time Spent in minutes  25 minutes   Barton Dubois M.D on 11/09/2016 at 1:29 PM  Between 7am to 7pm - Pager - [phone]  After 7pm go to [email or web address] - password [facility]  [facility] -  Office  [phone]

## 2016-11-09 NOTE — Progress Notes (Signed)
Inpatient Diabetes Program Recommendations  AACE/ADA: New Consensus Statement on Inpatient Glycemic Control (2015)  Target Ranges:  Prepandial:   less than 140 mg/dL      Peak postprandial:   less than 180 mg/dL (1-2 hours)      Critically ill patients:  140 - 180 mg/dL   Results for [name], [name] (MRN [id number]) as of 11/09/2016 09:27  Ref. Range 11/08/2016 07:59 11/08/2016 12:04 11/08/2016 16:50 11/08/2016 21:43  Glucose-Capillary Latest Ref Range: 65 - 99 mg/dL 248 (H) 151 (H) 181 (H) 232 (H)    Home DM Meds: Lantus 25 units daily       Novolog 1 unit for every 10 grams of Carbohydrates       Novolog 1 unit for every 50 mg/dl above Target CBG of 150 mg/dl  Current Insulin Orders: Lantus 20 units daily      Novolog Moderate Correction Scale/ SSI (0-15 units) TID AC + HS     Note patient admitted again for DKA.  This is her 61 th admission so far this year (since January 2018) for DKA.  Well known to the Inpatient Diabetes Program.  Counseled numerous times in the past about the importance of good glucose control.   MD- Note patient saw her Endocrinologist (Dr. Hartford Poli with [facility]) on 10/08/16.  At that appointment, patient was given the following instructions:  Patient Instructions - Stefanie Apley, MD - 10/08/2016 11:22 AM EDT 1. Use smart phone app for all meals and snacks for accurate insulin dosing. Write down all carbohydrate counts along with blood sugar readings. 2. Will check glucose and c-peptide levels today to complete application for the Medtronic insulin pump. 3. Switch Lantus Solostar to [name] 10 units daily. 4. Continue Novolog Flexpen 1 unit per 10 grams carbohydrates plus sliding scale: 1 unit for every blood sugar 50 above 150. 5. Check blood sugars at least 6 times per day (before and 2 hours after each meal). 6. Start Lisinopril 2.5mg  #1 tablet daily for kidney protection. 7. Keep appointment in November.      --Will follow patient during  hospitalization--  Wyn Quaker RN, MSN, CDE Diabetes Coordinator Inpatient Glycemic Control Team Team Pager: [phone] (8a-5p)

## 2016-11-10 LAB — CULTURE, BLOOD (ROUTINE X 2)
CULTURE: NO GROWTH
Culture: NO GROWTH
SPECIAL REQUESTS: ADEQUATE
SPECIAL REQUESTS: ADEQUATE

## 2016-11-10 LAB — GLUCOSE, CAPILLARY
GLUCOSE-CAPILLARY: 101 mg/dL — AB (ref 65–99)
GLUCOSE-CAPILLARY: 129 mg/dL — AB (ref 65–99)
Glucose-Capillary: 123 mg/dL — ABNORMAL HIGH (ref 65–99)
Glucose-Capillary: 180 mg/dL — ABNORMAL HIGH (ref 65–99)

## 2016-11-10 MED ORDER — MORPHINE SULFATE (PF) 4 MG/ML IV SOLN
2.0000 mg | Freq: Once | INTRAVENOUS | Status: AC
  Administered 2016-11-10: 2 mg via INTRAVENOUS
  Filled 2016-11-10: qty 1

## 2016-11-10 MED ORDER — SODIUM CHLORIDE 0.9 % IV SOLN
250.0000 mg | Freq: Three times a day (TID) | INTRAVENOUS | Status: DC
  Filled 2016-11-10 (×2): qty 5

## 2016-11-10 NOTE — Progress Notes (Signed)
PROGRESS NOTE                                                                                                                                                                                                             Patient Demographics:    [name], is a 26 y.o. female, DOB - 03/09/1990, CHY:[id number]  Admit date - 11/05/2016   Admitting Physician Phillips Grout, MD  Outpatient Primary MD for the patient is [name], [name]  LOS - 4    Chief Complaint  Patient presents with  . Emesis      Brief Narrative    26 y.o. female with medical history significant of frequent admissions for DKA and uncontrolled diabetes comes in with several days of persistent nausea and vomiting and sugars getting high, she was found in mild DKA,  This is her 10th admission in the last 6 months per [facility]   Subjective:    [name] no fever, no CP, no SOB> struggling to keep things down. Complaining of abd pain, nausea and vomiting. CBG's stable.    Assessment  & Plan :    Principal Problem:   DKA (diabetic ketoacidoses) ([place]) Active Problems:   Chronic diastolic heart failure (HCC)   Hypertension   Intractable nausea and vomiting   Gastroparesis   UTI (urinary tract infection)   Uncontrolled diabetes mellitus with DKA -A1C  9.1, demonstrating poor control -patient with 10th admission/ED visit in the last 6 months -DKA resolved. -Lantus adjusted to home dose. CBG's well controlled and anion gap closed.  -Continue SSI -patient to follow up with endocrinologist as an outpatient  -continue bicarbonate tablet for chronic metabolic acidosis. CO2 19 -will repeat IV pain meds X 1 to help with symptoms and also will give IV erythromycin for gastroparesis (if available). Continue antiemetics and continue reglan (last one will switch to IV). -continue to struggle keeping things down.  -Hopefully stable to go home soon.  Nausea and  vomiting -most likely due to her gastroparesis -Continue Reglan and PRN antiemetics -will add IV erythromycin if available  -patient encourage to follow small meals multiple times a day  -Follow clinical response  UTI and urinary retention symptoms  -She presented with abnormal urinalysis, but this has always been the case in the past. -has completed empirically treatment with Rocephin -continue in and  out cath as needed   -continue ditropan . -denies dysuria or hematuria, patient also afebrile.   History of GERD -will continue PPI, will switch to BID  Hypertension -BP controlled -will continue low dose lisinopril and home dose of metoprolol  History of depression -mood stable -no SI -continue prozac.    Code Status : full code  Family Communication  : None at bedside  Disposition Plan  : Home when stable and able to tolerate diet.  Consults  :  none  Procedures  : none  DVT Prophylaxis  :  Lovenox   Lab Results  Component Value Date   PLT 314 11/08/2016    Antibiotics  :   Anti-infectives    Start     Dose/Rate Route Frequency Ordered Stop   11/06/16 1000  cefTRIAXone (ROCEPHIN) 1 g in dextrose 5 % 50 mL IVPB     1 g 100 mL/hr over 30 Minutes Intravenous Every 24 hours 11/05/16 1525 11/08/16 0952   11/05/16 1030  cefTRIAXone (ROCEPHIN) 1 g in dextrose 5 % 50 mL IVPB     1 g 100 mL/hr over 30 Minutes Intravenous  Once 11/05/16 1019 11/05/16 1138        Objective:   Vitals:   11/10/16 0525 11/10/16 1349 11/10/16 1355 11/10/16 1424  BP: 124/83 (!) 153/106 (!) 173/106 (!) 159/106  Pulse: 97 (!) 107 (!) 105 (!) 105  Resp: 18 15    Temp: 98.8 F (37.1 C) 99 F (37.2 C)    TempSrc: Oral Oral    SpO2: 98%     Weight:      Height:        Wt Readings from Last 3 Encounters:  11/07/16 57.8 kg (127 lb 6.8 oz)  10/25/16 55.8 kg (123 lb 0.3 oz)  09/27/16 54.5 kg (120 lb 2.4 oz)    Intake/Output Summary (Last 24 hours) at 11/10/16 1907 Last data filed  at 11/10/16 1800  Gross per 24 hour  Intake             1380 ml  Output              103 ml  Net             1277 ml    Physical Exam GEN: AAOX3, no CP and afebrile. Patient continue experiencing significant nausea, intermittent vomiting and abd pain from her gastroparesis. Having trouble keeping things down here in the hospital.  Cardiovascular: very mild sinus tachycardia, no rubs, no gallops  Resp: CTA bilaterally.  Abd: soft, NT, ND, positive BS.  Extremities: no edema, no cyanosis, no clubbing.     Data Review:    CBC  Recent Labs Lab 11/05/16 0731 11/07/16 0905 11/08/16 0616  WBC 19.6* 7.7 6.9  HGB 14.7 11.3* 13.2  HCT 43.5 34.3* 39.2  PLT 494* 370 314  MCV 86.7 85.5 86.2  MCH 29.3 28.2 29.0  MCHC 33.8 32.9 33.7  RDW 14.5 14.5 14.7   Chemistries   Recent Labs Lab 11/05/16 0856 11/05/16 1546  11/06/16 0131 11/06/16 0638 11/06/16 1156 11/07/16 0905 11/08/16 0616  NA 139 135  < > 135 135 138 136 138  K 4.4 4.5  < > 3.8 3.8 3.8 3.8 3.9  CL 104 106  < > 107 109 109 108 107  CO2 16* 17*  < > 18* 19* 20* 17* 19*  GLUCOSE 392* 179*  < > 153* 120* 225* 259* 217*  BUN 16 14  < >  17 16 16 15 18   CREATININE 0.76 0.61  < > 0.67 0.54 0.53 0.60 0.70  CALCIUM 9.3 9.3  < > 9.4 8.4* 8.7* 8.4* 9.0  MG  --  1.7  --   --   --   --   --   --   AST 13*  --   --   --   --   --   --   --   ALT 11*  --   --   --   --   --   --   --   ALKPHOS 106  --   --   --   --   --   --   --   BILITOT 1.3*  --   --   --   --   --   --   --   < > = values in this interval not displayed.  Lab Results  Component Value Date   HGBA1C 9.1 (H) 11/05/2016    Cardiac Enzymes No results for input(s): CKMB, TROPONINI, MYOGLOBIN in the last 168 hours.  Invalid input(s): CK      Component Value Date/Time   BNP 6.9 07/11/2015 0353    Inpatient Medications  Scheduled Meds: . dicyclomine  20 mg Oral BID  . enoxaparin (LOVENOX) injection  40 mg Subcutaneous Q24H  . FLUoxetine  10 mg  Oral Daily  . insulin aspart  0-15 Units Subcutaneous TID WC  . insulin aspart  0-5 Units Subcutaneous QHS  . insulin glargine  25 Units Subcutaneous Daily  . lisinopril  2.5 mg Oral Daily  . metoCLOPramide (REGLAN) injection  10 mg Intravenous Q6H  . metoprolol tartrate  25 mg Oral BID  . oxybutynin  2.5 mg Oral TID  . pantoprazole (PROTONIX) IV  40 mg Intravenous Q12H  . sodium bicarbonate  650 mg Oral BID   Continuous Infusions: . sodium chloride 100 mL/hr at 11/10/16 1836   PRN Meds:.acetaminophen, albuterol, hydrALAZINE, HYDROcodone-acetaminophen, ondansetron (ZOFRAN) IV, promethazine  Micro Results Recent Results (from the past 240 hour(s))  Blood culture (routine x 2)     Status: None   Collection Time: 11/05/16 11:00 AM  Result Value Ref Range Status   Specimen Description BLOOD RIGHT WRIST  Final   Special Requests IN PEDIATRIC BOTTLE Blood Culture adequate volume  Final   Culture   Final    NO GROWTH 5 DAYS Performed at [facility], [place] [address] [place], [place] [address]    Report Status 11/10/2016 FINAL  Final  Blood culture (routine x 2)     Status: None   Collection Time: 11/05/16 11:10 AM  Result Value Ref Range Status   Specimen Description BLOOD LEFT ANTECUBITAL  Final   Special Requests   Final    BOTTLES DRAWN AEROBIC AND ANAEROBIC Blood Culture adequate volume   Culture   Final    NO GROWTH 5 DAYS Performed at [facility], [place] [address] [place], [place] [address]    Report Status 11/10/2016 FINAL  Final  MRSA PCR Screening     Status: None   Collection Time: 11/05/16  3:34 PM  Result Value Ref Range Status   MRSA by PCR NEGATIVE NEGATIVE Final    Comment:        The GeneXpert MRSA Assay (FDA approved for NASAL specimens only), is one component of a comprehensive MRSA colonization surveillance program. It is not intended to diagnose MRSA infection nor to guide  or monitor treatment for MRSA infections.     Radiology  Reports Ct Abdomen Pelvis W Contrast  Result Date: 10/23/2016 CLINICAL DATA:  Nausea and vomiting. EXAM: CT ABDOMEN AND PELVIS WITH CONTRAST TECHNIQUE: Multidetector CT imaging of the abdomen and pelvis was performed using the standard protocol following bolus administration of intravenous contrast. CONTRAST:  142mL ISOVUE-300 IOPAMIDOL (ISOVUE-300) INJECTION 61% COMPARISON:  CT abdomen pelvis dated February 20, 2016. FINDINGS: Lower chest: No acute abnormality. Hepatobiliary: Four hyperenhancing lesions within the liver are again seen, previously characterized as hepatic adenomas. Status post cholecystectomy. No biliary dilatation. Pancreas: Unremarkable. No pancreatic ductal dilatation or surrounding inflammatory changes. Spleen: Normal in size without focal abnormality. Adrenals/Urinary Tract: Adrenal glands are unremarkable. Kidneys are normal, without renal calculi, focal lesion, or hydronephrosis. The bladder is markedly distended. Air within the bladder is likely related to recent instrumentation. Stomach/Bowel: The stomach and duodenum are prominently distended. Appendix appears normal. No evidence of bowel wall thickening, distention, or inflammatory changes. Vascular/Lymphatic: No significant vascular findings are present. Probable mixing artifact in the portal system. No enlarged abdominal or pelvic lymph nodes. Reproductive: Uterus and bilateral adnexa are unremarkable. Right corpus luteum. Other: Trace free fluid in the pelvis is likely physiologic. No pneumoperitoneum. Musculoskeletal: No acute or significant osseous findings. IMPRESSION: 1. No acute intra-abdominal process. 2. Prominent distention of the stomach and duodenum, which may be related to patient's history of gastroparesis. 3. Marked bladder distention. Air within the bladder is likely related to recent instrumentation. No hydronephrosis. 4. Four hyperenhancing lesions within the liver are similar in size to prior study, previously  characterized as hepatic adenomas. Electronically Signed   By: Titus Dubin M.D.   On: 10/23/2016 20:26   Dg Abd Acute W/chest  Result Date: 10/23/2016 CLINICAL DATA:  Nausea and vomiting since this morning. History of gastroparesis. EXAM: DG ABDOMEN ACUTE W/ 1V CHEST COMPARISON:  07/23/2016 FINDINGS: Bowel gas pattern is within normal limits. Relatively gasless abdomen consistent with the history of vomiting. Surgical clips related to previous cholecystectomy. No free air. One-view chest is normal. IMPRESSION: No acute finding. Relatively gasless abdomen consistent with the clinical history of vomiting. Electronically Signed   By: Nelson Chimes M.D.   On: 10/23/2016 14:12    Time Spent in minutes  25 minutes   Barton Dubois M.D on 11/10/2016 at 7:07 PM  Between 7am to 7pm - Pager - [phone]  After 7pm go to [email or web address] - password [facility]  [facility] -  Office  [phone]

## 2016-11-11 LAB — GLUCOSE, CAPILLARY: GLUCOSE-CAPILLARY: 214 mg/dL — AB (ref 65–99)

## 2016-11-11 LAB — BASIC METABOLIC PANEL
ANION GAP: 10 (ref 5–15)
BUN: 5 mg/dL — ABNORMAL LOW (ref 6–20)
CALCIUM: 8.4 mg/dL — AB (ref 8.9–10.3)
CO2: 22 mmol/L (ref 22–32)
Chloride: 105 mmol/L (ref 101–111)
Creatinine, Ser: 0.55 mg/dL (ref 0.44–1.00)
GFR calc non Af Amer: >60 mL/min (ref >60)
Glucose, Bld: 274 mg/dL — ABNORMAL HIGH (ref 65–99)
Potassium: 3.5 mmol/L (ref 3.5–5.1)
SODIUM: 137 mmol/L (ref 135–145)

## 2016-11-11 LAB — MAGNESIUM: MAGNESIUM: 1.4 mg/dL — AB (ref 1.7–2.4)

## 2016-11-11 MED ORDER — MAGNESIUM SULFATE 2 GM/50ML IV SOLN
2.0000 g | Freq: Once | INTRAVENOUS | Status: DC
  Filled 2016-11-11: qty 50

## 2016-11-11 MED ORDER — INSULIN GLARGINE 100 UNIT/ML ~~LOC~~ SOLN
30.0000 [IU] | Freq: Every day | SUBCUTANEOUS | Status: DC
  Administered 2016-11-11: 30 [IU] via SUBCUTANEOUS
  Filled 2016-11-11: qty 0.3

## 2016-11-11 NOTE — Discharge Summary (Signed)
Physician Discharge Summary  [name] YJE:[id number] DOB: Jul 29, 1990 DOA: 11/05/2016  PCP: Vicenta Aly, FNP  Admit date: 11/05/2016 Discharge date: 11/11/2016  Admitted From: home Disposition:  Home  Recommendations for Outpatient Follow-up:  1. Follow up with PCP in 1-2 weeks 2. Please obtain BMP/CBC in one week   Home Health:No Equipment/Devices:none  Discharge Condition:stable CODE STATUS:full Diet recommendation: Carb Modified  Brief/Interim Summary: 26 year old with past medical history frequent DKAs comes in for several days of persistent nausea and vomiting and hyperglycemia, the patient relates his pain.  Discharge Diagnoses:  Principal Problem:   DKA (diabetic ketoacidoses) ([place]) Active Problems:   Chronic diastolic heart failure (HCC)   Hypertension   Intractable nausea and vomiting   Gastroparesis   UTI (urinary tract infection)  Uncontrolled diabetes mellitus with DKA: She was started on IV insulin drip once her blood glucose has improved as he was transitioned to long-acting insulin plus sliding scale. This likely due to noncompliance. Her blood glucose on her last 24 hours range from 182- 214  Nausea and vomiting due to gastroparesis: She has a history of gastroparesis she was started on IV Reglan and erythromycin and by the next day he was resolved.  Urinary symptoms: She has presented with an abnormal urinalysis which has been present in the past she has completed her Rocephin treatment as an inpatient.  History of GERD: No changes are made to her medication.  Essential hypertension: No changes made to her medication.  History of depression: No changes made to her medication. Discharge Instructions  Discharge Instructions    Diet - low sodium heart healthy    Complete by:  As directed    Increase activity slowly    Complete by:  As directed      Allergies as of 11/11/2016      Reactions   Peanut-containing Drug Products  Swelling, Other (See Comments)   Reaction:  Swelling of mouth and lips    Food Swelling, Other (See Comments)   Pt is allergic to strawberries.   Reaction:  Swelling of mouth and lips    Ultram [tramadol] Itching      Medication List    TAKE these medications   albuterol 108 (90 Base) MCG/ACT inhaler Commonly known as:  PROVENTIL HFA;VENTOLIN HFA Inhale 1-2 puffs into the lungs every 6 (six) hours as needed for wheezing or shortness of breath.   dicyclomine 20 MG tablet Commonly known as:  BENTYL Take 1 tablet (20 mg total) by mouth 2 (two) times daily.   glucagon 1 MG injection Commonly known as:  GLUCAGON EMERGENCY Inject 1 mg into the vein once as needed. What changed:  additional instructions   insulin glargine 100 UNIT/ML injection Commonly known as:  LANTUS Inject 0.25 mLs (25 Units total) into the skin daily.   lisinopril 2.5 MG tablet Commonly known as:  PRINIVIL,ZESTRIL Take 2.5 mg by mouth daily.   metoCLOPramide 10 MG tablet Commonly known as:  REGLAN Take 1 tablet (10 mg total) by mouth every 6 (six) hours as needed for nausea.   metoprolol tartrate 25 MG tablet Commonly known as:  LOPRESSOR Take 0.5 tablets (12.5 mg total) by mouth 2 (two) times daily.   NOVOLOG FLEXPEN 100 UNIT/ML FlexPen Generic drug:  insulin aspart Inject 1-10 Units into the skin 3 (three) times daily with meals. Pt uses 1 unit for every 10 grams of carbs with meals and snacks for BS greater than 50.   ondansetron 4 MG tablet Commonly known as:  ZOFRAN Take 1 tablet (4 mg total) by mouth every 6 (six) hours. What changed:  when to take this  reasons to take this   oxybutynin 5 MG tablet Commonly known as:  DITROPAN Take 0.5 tablets (2.5 mg total) by mouth 3 (three) times daily.   RESTASIS MULTIDOSE 0.05 % ophthalmic emulsion Generic drug:  cycloSPORINE Place 1 drop into both eyes every 12 (twelve) hours.   Vitamin D (Ergocalciferol) 50000 units Caps capsule Commonly known  as:  DRISDOL Take 50,000 Units by mouth once a week. On fridays       Allergies  Allergen Reactions  . Peanut-Containing Drug Products Swelling and Other (See Comments)    Reaction:  Swelling of mouth and lips   . 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   Reaction:  Swelling of mouth and lips   . Ultram [Tramadol] Itching    Consultations:  None   Procedures/Studies: Ct Abdomen Pelvis W Contrast  Result Date: 10/23/2016 CLINICAL DATA:  Nausea and vomiting. EXAM: CT ABDOMEN AND PELVIS WITH CONTRAST TECHNIQUE: Multidetector CT imaging of the abdomen and pelvis was performed using the standard protocol following bolus administration of intravenous contrast. CONTRAST:  132mL ISOVUE-300 IOPAMIDOL (ISOVUE-300) INJECTION 61% COMPARISON:  CT abdomen pelvis dated February 20, 2016. FINDINGS: Lower chest: No acute abnormality. Hepatobiliary: Four hyperenhancing lesions within the liver are again seen, previously characterized as hepatic adenomas. Status post cholecystectomy. No biliary dilatation. Pancreas: Unremarkable. No pancreatic ductal dilatation or surrounding inflammatory changes. Spleen: Normal in size without focal abnormality. Adrenals/Urinary Tract: Adrenal glands are unremarkable. Kidneys are normal, without renal calculi, focal lesion, or hydronephrosis. The bladder is markedly distended. Air within the bladder is likely related to recent instrumentation. Stomach/Bowel: The stomach and duodenum are prominently distended. Appendix appears normal. No evidence of bowel wall thickening, distention, or inflammatory changes. Vascular/Lymphatic: No significant vascular findings are present. Probable mixing artifact in the portal system. No enlarged abdominal or pelvic lymph nodes. Reproductive: Uterus and bilateral adnexa are unremarkable. Right corpus luteum. Other: Trace free fluid in the pelvis is likely physiologic. No pneumoperitoneum. Musculoskeletal: No  acute or significant osseous findings. IMPRESSION: 1. No acute intra-abdominal process. 2. Prominent distention of the stomach and duodenum, which may be related to patient's history of gastroparesis. 3. Marked bladder distention. Air within the bladder is likely related to recent instrumentation. No hydronephrosis. 4. Four hyperenhancing lesions within the liver are similar in size to prior study, previously characterized as hepatic adenomas. Electronically Signed   By: Titus Dubin M.D.   On: 10/23/2016 20:26   Dg Abd Acute W/chest  Result Date: 10/23/2016 CLINICAL DATA:  Nausea and vomiting since this morning. History of gastroparesis. EXAM: DG ABDOMEN ACUTE W/ 1V CHEST COMPARISON:  07/23/2016 FINDINGS: Bowel gas pattern is within normal limits. Relatively gasless abdomen consistent with the history of vomiting. Surgical clips related to previous cholecystectomy. No free air. One-view chest is normal. IMPRESSION: No acute finding. Relatively gasless abdomen consistent with the clinical history of vomiting. Electronically Signed   By: Nelson Chimes M.D.   On: 10/23/2016 14:12    (Echo, Carotid, EGD, Colonoscopy, ERCP)    Subjective: She relates feels great no new complaints, did tolerate her diet this morning  Discharge Exam: Vitals:   11/11/16 0520 11/11/16 0911  BP: 128/80 (!) 147/101  Pulse: 90 94  Resp: 18 18  Temp: 98.2 F (36.8 C) 98.6 F (37 C)  SpO2: 100% 100%   Vitals:  11/10/16 1424 11/10/16 2050 11/11/16 0520 11/11/16 0911  BP: (!) 159/106 135/80 128/80 (!) 147/101  Pulse: (!) 105 (!) 101 90 94  Resp:  18 18 18   Temp:  99.1 F (37.3 C) 98.2 F (36.8 C) 98.6 F (37 C)  TempSrc:  Oral Oral Oral  SpO2:  100% 100% 100%  Weight:      Height:        General: Pt is alert, awake, not in acute distress Cardiovascular: RRR, S1/S2 +, no rubs, no gallops Respiratory: CTA bilaterally, no wheezing, no rhonchi Abdominal: Soft, NT, ND, bowel sounds + Extremities: no edema,  no cyanosis    The results of significant diagnostics from this hospitalization (including imaging, microbiology, ancillary and laboratory) are listed below for reference.     Microbiology: Recent Results (from the past 240 hour(s))  Blood culture (routine x 2)     Status: None   Collection Time: 11/05/16 11:00 AM  Result Value Ref Range Status   Specimen Description BLOOD RIGHT WRIST  Final   Special Requests IN PEDIATRIC BOTTLE Blood Culture adequate volume  Final   Culture   Final    NO GROWTH 5 DAYS Performed at [facility], [address], [place], [place] [address]    Report Status 11/10/2016 FINAL  Final  Blood culture (routine x 2)     Status: None   Collection Time: 11/05/16 11:10 AM  Result Value Ref Range Status   Specimen Description BLOOD LEFT ANTECUBITAL  Final   Special Requests   Final    BOTTLES DRAWN AEROBIC AND ANAEROBIC Blood Culture adequate volume   Culture   Final    NO GROWTH 5 DAYS Performed at [facility], [place] [address], [place], [place] [address]    Report Status 11/10/2016 FINAL  Final  MRSA PCR Screening     Status: None   Collection Time: 11/05/16  3:34 PM  Result Value Ref Range Status   MRSA by PCR NEGATIVE NEGATIVE Final    Comment:        The GeneXpert MRSA Assay (FDA approved for NASAL specimens only), is one component of a comprehensive MRSA colonization surveillance program. It is not intended to diagnose MRSA infection nor to guide or monitor treatment for MRSA infections.      Labs: BNP (last 3 results) No results for input(s): BNP in the last 8760 hours. Basic Metabolic Panel:  Recent Labs Lab 11/05/16 1546  11/06/16 9563 11/06/16 1156 11/07/16 0905 11/08/16 0616 11/11/16 0600  NA 135  < > 135 138 136 138 137  K 4.5  < > 3.8 3.8 3.8 3.9 3.5  CL 106  < > 109 109 108 107 105  CO2 17*  < > 19* 20* 17* 19* 22  GLUCOSE 179*  < > 120* 225* 259* 217* 274*  BUN 14  < > 16 16 15 18  5*  CREATININE 0.61  < >  0.54 0.53 0.60 0.70 0.55  CALCIUM 9.3  < > 8.4* 8.7* 8.4* 9.0 8.4*  MG 1.7  --   --   --   --   --  1.4*  < > = values in this interval not displayed. Liver Function Tests:  Recent Labs Lab 11/05/16 0856  AST 13*  ALT 11*  ALKPHOS 106  BILITOT 1.3*  PROT 8.3*  ALBUMIN 4.6    Recent Labs Lab 11/05/16 0856  LIPASE 16   No results for input(s): AMMONIA in the last 168  hours. CBC:  Recent Labs Lab 11/05/16 0731 11/07/16 0905 11/08/16 0616  WBC 19.6* 7.7 6.9  HGB 14.7 11.3* 13.2  HCT 43.5 34.3* 39.2  MCV 86.7 85.5 86.2  PLT 494* 370 314   Cardiac Enzymes: No results for input(s): CKTOTAL, CKMB, CKMBINDEX, TROPONINI in the last 168 hours. BNP: Invalid input(s): POCBNP CBG:  Recent Labs Lab 11/10/16 0758 11/10/16 1205 11/10/16 1709 11/10/16 2048 11/11/16 0727  GLUCAP 123* 101* 129* 180* 214*   D-Dimer No results for input(s): DDIMER in the last 72 hours. Hgb A1c No results for input(s): HGBA1C in the last 72 hours. Lipid Profile No results for input(s): CHOL, HDL, LDLCALC, TRIG, CHOLHDL, LDLDIRECT in the last 72 hours. Thyroid function studies No results for input(s): TSH, T4TOTAL, T3FREE, THYROIDAB in the last 72 hours.  Invalid input(s): FREET3 Anemia work up No results for input(s): VITAMINB12, FOLATE, FERRITIN, TIBC, IRON, RETICCTPCT in the last 72 hours. Urinalysis    Component Value Date/Time   COLORURINE YELLOW 11/05/2016 0937   APPEARANCEUR CLOUDY (A) 11/05/2016 0937   LABSPEC 1.026 11/05/2016 0937   PHURINE 5.0 11/05/2016 0937   GLUCOSEU >=500 (A) 11/05/2016 0937   GLUCOSEU >=1000 11/07/2012 1205   HGBUR SMALL (A) 11/05/2016 0937   BILIRUBINUR NEGATIVE 11/05/2016 0937   KETONESUR 80 (A) 11/05/2016 0937   PROTEINUR 30 (A) 11/05/2016 0937   UROBILINOGEN 0.2 11/24/2014 1045   NITRITE NEGATIVE 11/05/2016 0937   LEUKOCYTESUR LARGE (A) 11/05/2016 0937   Sepsis Labs Invalid input(s): PROCALCITONIN,  WBC,  LACTICIDVEN Microbiology Recent  Results (from the past 240 hour(s))  Blood culture (routine x 2)     Status: None   Collection Time: 11/05/16 11:00 AM  Result Value Ref Range Status   Specimen Description BLOOD RIGHT WRIST  Final   Special Requests IN PEDIATRIC BOTTLE Blood Culture adequate volume  Final   Culture   Final    NO GROWTH 5 DAYS Performed at [facility], [place] [address] [place],  [address]    Report Status 11/10/2016 FINAL  Final  Blood culture (routine x 2)     Status: None   Collection Time: 11/05/16 11:10 AM  Result Value Ref Range Status   Specimen Description BLOOD LEFT ANTECUBITAL  Final   Special Requests   Final    BOTTLES DRAWN AEROBIC AND ANAEROBIC Blood Culture adequate volume   Culture   Final    NO GROWTH 5 DAYS Performed at [facility], [place] [address], [place],  [address]    Report Status 11/10/2016 FINAL  Final  MRSA PCR Screening     Status: None   Collection Time: 11/05/16  3:34 PM  Result Value Ref Range Status   MRSA by PCR NEGATIVE NEGATIVE Final    Comment:        The GeneXpert MRSA Assay (FDA approved for NASAL specimens only), is one component of a comprehensive MRSA colonization surveillance program. It is not intended to diagnose MRSA infection nor to guide or monitor treatment for MRSA infections.      Time coordinating discharge: Over 30 minutes  SIGNED:   Charlynne Cousins, MD  [facility] 11/11/2016, 11:41 AM Pager   If 7PM-7AM, please contact night-coverage [email or web address] Password [email or web address]

## 2016-11-11 NOTE — Progress Notes (Signed)
Pts IV removed with a clean and dry dressing intact. Pt denies pain at the time of d/c with no s/s of distress noted. Pt ambulated to front entrance

## 2016-12-15 ENCOUNTER — Emergency Department (HOSPITAL_COMMUNITY): Payer: Medicaid Other

## 2016-12-15 ENCOUNTER — Emergency Department (HOSPITAL_COMMUNITY)
Admission: EM | Admit: 2016-12-15 | Discharge: 2016-12-16 | Disposition: A | Discharge status: Home / Self Care | Payer: Medicaid Other | Attending: Emergency Medicine | Admitting: Emergency Medicine

## 2016-12-15 ENCOUNTER — Other Ambulatory Visit: Payer: Self-pay

## 2016-12-15 DIAGNOSIS — R1084 Generalized abdominal pain: Secondary | ICD-10-CM | POA: Diagnosis not present

## 2016-12-15 DIAGNOSIS — N390 Urinary tract infection, site not specified: Secondary | ICD-10-CM | POA: Insufficient documentation

## 2016-12-15 DIAGNOSIS — R319 Hematuria, unspecified: Secondary | ICD-10-CM | POA: Insufficient documentation

## 2016-12-15 DIAGNOSIS — Z794 Long term (current) use of insulin: Secondary | ICD-10-CM | POA: Diagnosis not present

## 2016-12-15 DIAGNOSIS — E1343 Other specified diabetes mellitus with diabetic autonomic (poly)neuropathy: Secondary | ICD-10-CM | POA: Insufficient documentation

## 2016-12-15 DIAGNOSIS — K3184 Gastroparesis: Secondary | ICD-10-CM | POA: Diagnosis not present

## 2016-12-15 DIAGNOSIS — R112 Nausea with vomiting, unspecified: Secondary | ICD-10-CM | POA: Diagnosis present

## 2016-12-15 LAB — BASIC METABOLIC PANEL
Anion gap: 12 (ref 5–15)
BUN: 20 mg/dL (ref 6–20)
CO2: 25 mmol/L (ref 22–32)
Calcium: 9.8 mg/dL (ref 8.9–10.3)
Chloride: 100 mmol/L — ABNORMAL LOW (ref 101–111)
Creatinine, Ser: 0.64 mg/dL (ref 0.44–1.00)
GFR calc Af Amer: >60 mL/min (ref >60)
GFR calc non Af Amer: >60 mL/min (ref >60)
Glucose, Bld: 242 mg/dL — ABNORMAL HIGH (ref 65–99)
POTASSIUM: 4.1 mmol/L (ref 3.5–5.1)
SODIUM: 137 mmol/L (ref 135–145)

## 2016-12-15 LAB — CBC WITH DIFFERENTIAL/PLATELET
BASOS ABS: 0 10*3/uL (ref 0.0–0.1)
BASOS PCT: 0 %
EOS PCT: 0 %
Eosinophils Absolute: 0 10*3/uL (ref 0.0–0.7)
HEMATOCRIT: 41.7 % (ref 36.0–46.0)
HEMOGLOBIN: 14.1 g/dL (ref 12.0–15.0)
LYMPHS PCT: 10 %
Lymphs Abs: 1.1 10*3/uL (ref 0.7–4.0)
MCH: 29.3 pg (ref 26.0–34.0)
MCHC: 33.8 g/dL (ref 30.0–36.0)
MCV: 86.5 fL (ref 78.0–100.0)
MONO ABS: 0.3 10*3/uL (ref 0.1–1.0)
Monocytes Relative: 3 %
NEUTROS ABS: 9.6 10*3/uL — AB (ref 1.7–7.7)
Neutrophils Relative %: 87 %
Platelets: 532 10*3/uL — ABNORMAL HIGH (ref 150–400)
RBC: 4.82 MIL/uL (ref 3.87–5.11)
RDW: 14.7 % (ref 11.5–15.5)
WBC: 11 10*3/uL — AB (ref 4.0–10.5)

## 2016-12-15 LAB — HCG, QUANTITATIVE, PREGNANCY

## 2016-12-15 MED ORDER — DIPHENHYDRAMINE HCL 50 MG/ML IJ SOLN
25.0000 mg | Freq: Once | INTRAMUSCULAR | Status: AC
  Administered 2016-12-16: 25 mg via INTRAVENOUS
  Filled 2016-12-15: qty 1

## 2016-12-15 MED ORDER — HALOPERIDOL LACTATE 5 MG/ML IJ SOLN
5.0000 mg | Freq: Once | INTRAMUSCULAR | Status: AC
  Administered 2016-12-16: 5 mg via INTRAVENOUS
  Filled 2016-12-15: qty 1

## 2016-12-15 MED ORDER — SODIUM CHLORIDE 0.9 % IV BOLUS (SEPSIS)
1000.0000 mL | Freq: Once | INTRAVENOUS | Status: AC
  Administered 2016-12-15: 1000 mL via INTRAVENOUS

## 2016-12-15 MED ORDER — HALOPERIDOL LACTATE 5 MG/ML IJ SOLN
2.0000 mg | Freq: Once | INTRAMUSCULAR | Status: AC
  Administered 2016-12-15: 2 mg via INTRAVENOUS
  Filled 2016-12-15: qty 1

## 2016-12-15 MED ORDER — ONDANSETRON HCL 4 MG/2ML IJ SOLN
4.0000 mg | Freq: Once | INTRAMUSCULAR | Status: AC
  Administered 2016-12-15: 4 mg via INTRAVENOUS
  Filled 2016-12-15: qty 2

## 2016-12-15 NOTE — ED Notes (Signed)
IV team attempted to gain IV access and get blood work. IV team stuck pt but was unable to obtain an IV, even using ultrasound.

## 2016-12-15 NOTE — ED Notes (Signed)
Pt attempted to give urine sample with no success. RN notifed.

## 2016-12-15 NOTE — ED Notes (Signed)
Pt has attempted to provide urine specimen for second time with no success.

## 2016-12-15 NOTE — ED Notes (Signed)
Bed: WA02 Expected date:  Expected time:  Means of arrival:  Comments: EMS-gastroparesis

## 2016-12-15 NOTE — ED Notes (Signed)
Pt is writhing around on the bed and will not stop.  EMS reported to RN they tried 3 tries to get IV access and could not get it because of her movement.

## 2016-12-15 NOTE — ED Triage Notes (Signed)
Per EMS: Pt is coming from home. Pt reports that 12 hours ago pt began having a gastroporesis flare up. Pt reports severe abdominal pain with vomiting.  Dry heaving with EMS.  Pt is a diabetic  CBG 111 10/10 pain Soft non distended abdomen

## 2016-12-15 NOTE — ED Notes (Signed)
Pt peed in the bed. Pt did not call out or attempt to walk or go to the bathroom.

## 2016-12-15 NOTE — ED Provider Notes (Signed)
[facility] DEPT Provider Note   CSN: [id number] Arrival date & time: 12/15/16  1754     History   Chief Complaint Chief Complaint  Patient presents with  . Abdominal Pain  . Emesis    HPI [name] is a 26 y.o. female.  Patient with history of gastroparesis presents via EMS today complaining of nausea with vomiting and generalized abdominal pain since this morning. Has not been able to tolerate oral fluids today. Patient is writhing about on the stretcher.    Abdominal Pain   This is a recurrent problem. The current episode started 12 to 24 hours ago. The problem occurs daily. The problem has been gradually worsening. The pain is located in the generalized abdominal region. The pain is moderate. Associated symptoms include nausea and vomiting.    Past Medical History:  Diagnosis Date  . Anxiety   . Arthritis   . Asthma   . Diabetes mellitus 2007   IDDM.  poorly controlled, multiple admits with DKA  . Gallstones   . Gastroparesis   . Heart murmur   . Hepatic steatosis 11/26/2014   and hepatomegaly  . Hypertension    NOT CURRENTLY ON ANY BP MED  . Liver mass 11/26/2014  . Pancreatitis, acute 11/26/2014    Patient Active Problem List   Diagnosis Date Noted  . Leukocytosis 10/24/2016  . N&V (nausea and vomiting) 10/23/2016  . DKA, type 1 ([place]) 09/26/2016  . Thrombocytosis ([place]) 07/08/2016  . Candiduria, asymptomatic 06/23/2016  . Hyponatremia 06/13/2016  . Hematuria 05/13/2016  . UTI (urinary tract infection) 01/22/2016  . Diarrhea 11/09/2015  . Acute urinary retention   . GERD (gastroesophageal reflux disease) 07/10/2015  . Depression with anxiety 07/10/2015  . Gastroparesis 06/22/2015  . Altered mental status 06/22/2015  . Volume depletion 06/10/2015  . Protein-calorie malnutrition, severe 06/10/2015  . Hyperglycemia   . Elevated bilirubin   . Hematemesis with nausea   . Intractable nausea and vomiting 05/28/2015    . Abdominal pain in female   . Abdominal pain 05/24/2015  . Hypertension 05/24/2015  . Dehydration   . Chronic diastolic heart failure ([place]) 04/11/2015  . Hematemesis 04/08/2015  . DKA (diabetic ketoacidoses) ([place]) 03/22/2015  . S/P laparoscopic cholecystectomy 02/11/2015  . Postextubation stridor   . Pancreatitis, acute 11/26/2014  . Volume overload 11/26/2014  . Hypokalemia 11/26/2014  . Hepatic steatosis 11/26/2014  . Liver mass 11/26/2014  . Sepsis ([place]) 11/25/2014  . Sinus tachycardia 11/25/2014  . Hypomagnesemia 11/25/2014  . Hypophosphatemia 11/25/2014  . Elevated LFTs 11/24/2014  . AKI (acute kidney injury) ([address] [address]) 11/24/2014  . Migraine headache 11/24/2014  . Asthma 06/29/2012  . Uncontrolled type 1 diabetes mellitus ([place]) 06/19/2010  . Goiter, unspecified 06/19/2010    Past Surgical History:  Procedure Laterality Date  . CHOLECYSTECTOMY N/A 02/11/2015   Procedure: LAPAROSCOPIC CHOLECYSTECTOMY WITH INTRAOPERATIVE CHOLANGIOGRAM;  Surgeon: Greer Pickerel, MD;  Location: [facility];  Service: General;  Laterality: N/A;  . ESOPHAGOGASTRODUODENOSCOPY (EGD) WITH PROPOFOL Left 09/20/2014   Procedure: ESOPHAGOGASTRODUODENOSCOPY (EGD) WITH PROPOFOL;  Surgeon: Arta Silence, MD;  Location[facility] ENDOSCOPY;  Service: Endoscopy;  Laterality: Left;  . WISDOM TOOTH EXTRACTION      OB History    Gravida Para Term Preterm AB Living   2 1 0 1 1 1    SAB TAB Ectopic Multiple Live Births   0 1 0 0 1       Home Medications    Prior to Admission medications   Medication  Sig Start Date End Date Taking? Authorizing Provider  albuterol (PROVENTIL HFA;VENTOLIN HFA) 108 (90 Base) MCG/ACT inhaler Inhale 1-2 puffs into the lungs every 6 (six) hours as needed for wheezing or shortness of breath.    [provider]  dicyclomine (BENTYL) 20 MG tablet Take 1 tablet (20 mg total) by mouth 2 (two) times daily. 07/13/16   Volanda Napoleon, PA-C  glucagon (GLUCAGON EMERGENCY) 1 MG injection Inject  1 mg into the vein once as needed. Patient taking differently: Inject 1 mg into the vein once as needed. High BS 06/14/16   Sheikh, Omair Latif, DO  insulin aspart (NOVOLOG FLEXPEN) 100 UNIT/ML FlexPen Inject 1-10 Units into the skin 3 (three) times daily with meals. Pt uses 1 unit for every 10 grams of carbs with meals and snacks for BS greater than 50.    [provider]  insulin glargine (LANTUS) 100 UNIT/ML injection Inject 0.25 mLs (25 Units total) into the skin daily. 09/29/16   Domenic Polite, MD  lisinopril (PRINIVIL,ZESTRIL) 2.5 MG tablet Take 2.5 mg by mouth daily. 10/08/16   [provider]  metoCLOPramide (REGLAN) 10 MG tablet Take 1 tablet (10 mg total) by mouth every 6 (six) hours as needed for nausea. 07/23/16   Orlie Dakin, MD  metoprolol tartrate (LOPRESSOR) 25 MG tablet Take 0.5 tablets (12.5 mg total) by mouth 2 (two) times daily. 05/10/16   Mariel Aloe, MD  ondansetron (ZOFRAN) 4 MG tablet Take 1 tablet (4 mg total) by mouth every 6 (six) hours. Patient taking differently: Take 4 mg by mouth every 8 (eight) hours as needed for nausea.  07/13/16   Volanda Napoleon, PA-C  oxybutynin (DITROPAN) 5 MG tablet Take 0.5 tablets (2.5 mg total) by mouth 3 (three) times daily. 10/29/16   Donne Hazel, MD  RESTASIS MULTIDOSE 0.05 % ophthalmic emulsion Place 1 drop into both eyes every 12 (twelve) hours. 09/08/16   [provider]  Vitamin D, Ergocalciferol, (DRISDOL) 50000 units CAPS capsule Take 50,000 Units by mouth once a week. On fridays 05/26/16   [provider]    Family History Family History  Problem Relation Age of Onset  . Heart disease Maternal Grandmother   . Heart disease Maternal Grandfather   . Diabetes Mother   . Hyperlipidemia Mother   . Hypertension Father   . Heart disease Father   . Hypertension Paternal Grandmother   . Cancer Paternal Grandfather     Social History Social History   Tobacco Use  . Smoking status: Never  Smoker  . Smokeless tobacco: Never Used  Substance Use Topics  . Alcohol use: No  . Drug use: Yes    Types: Marijuana    Comment: last used Saturday     Allergies   Peanut-containing drug products; Food; and Ultram [tramadol]   Review of Systems Review of Systems  Gastrointestinal: Positive for abdominal pain, nausea and vomiting.  All other systems reviewed and are negative.    Physical Exam Updated Vital Signs BP 94/64 (BP Location: Left Arm)   Pulse (!) 106   Temp 98.9 F (37.2 C) (Oral)   Resp (!) 24   SpO2 100%   Physical Exam  Constitutional: She is oriented to person, place, and time. She appears well-developed. She appears distressed.  Eyes: Pupils are equal, round, and reactive to light.  Neck: Neck supple.  Cardiovascular: Regular rhythm and intact distal pulses.  Pulmonary/Chest: Effort normal and breath sounds normal.  Abdominal: She exhibits no  distension. There is generalized tenderness.  Musculoskeletal: Normal range of motion. She exhibits no edema.  Neurological: She is alert and oriented to person, place, and time.  Skin: Skin is warm and dry.  Psychiatric: Her speech is normal. Her affect is blunt.  Nursing note and vitals reviewed.    ED Treatments / Results  Labs (all labs ordered are listed, but only abnormal results are displayed) Labs Reviewed  CBC WITH DIFFERENTIAL/PLATELET  BASIC METABOLIC PANEL  URINALYSIS, ROUTINE W REFLEX MICROSCOPIC  RAPID URINE DRUG SCREEN, HOSP PERFORMED  I-STAT BETA HCG BLOOD, ED ([facility], [facility], [facility] ONLY)    EKG  EKG Interpretation None       Radiology Dg Abdomen 1 View  Result Date: 12/15/2016 CLINICAL DATA:  26 y/o F; abdominal pain. History of gastroparesis. EXAM: ABDOMEN - 1 VIEW COMPARISON:  10/23/2016 CT abdomen and pelvis. FINDINGS: The bowel gas pattern is normal. No radio-opaque calculi or other significant radiographic abnormality are seen. Right upper quadrant cholecystectomy clips. IMPRESSION:  Normal bowel gas pattern. Electronically Signed   By: Kristine Garbe M.D.   On: 12/15/2016 22:22    Procedures Procedures (including critical care time)  Medications Ordered in ED Medications  capsaicin (ZOSTRIX) 0.025 % cream (not administered)  ondansetron (ZOFRAN) injection 4 mg (4 mg Intravenous Given 12/15/16 2012)  haloperidol lactate (HALDOL) injection 2 mg (2 mg Intravenous Given 12/15/16 2012)  sodium chloride 0.9 % bolus 1,000 mL (0 mLs Intravenous Stopped 12/15/16 2141)  haloperidol lactate (HALDOL) injection 5 mg (5 mg Intravenous Given 12/16/16 0008)  diphenhydrAMINE (BENADRYL) injection 25 mg (25 mg Intravenous Given 12/16/16 0008)  cefTRIAXone (ROCEPHIN) 1 g in dextrose 5 % 50 mL IVPB (0 g Intravenous Stopped 12/16/16 0139)     Initial Impression / Assessment and Plan / ED Course  I have reviewed the triage vital signs and the nursing notes.  Pertinent labs & imaging results that were available during my care of the patient were reviewed by me and considered in my medical decision making (see chart for details).     Patient with gastroparesis. History of diabetes. Not in DKA today. Admits to marijuana use with urine positive for THC. Normal bowel gas pattern on xray. Nausea and vomiting improved after haldol and benadryl. Will apply capsaicin in ED and send home with patient, Patient also given Rx for reglan.  Recurrent UTI. Given rocephin in ED, home with keflex. Urine culture sent. Patient to follow-up with her PCP.  Final Clinical Impressions(s) / ED Diagnoses   Final diagnoses:  Gastroparesis  Urinary tract infection with hematuria, site unspecified    ED Discharge Orders        Ordered    cephALEXin (KEFLEX) 500 MG capsule  4 times daily     12/16/16 0048    metoCLOPramide (REGLAN) 10 MG tablet  Every 6 hours     12/16/16 0048       Etta Quill, NP 12/16/16 0150    Daleen Bo, MD 12/17/16 [phone]

## 2016-12-15 NOTE — ED Notes (Signed)
Pt attempted to provide urine sample but sample was contaminated with feces and toilet paper.

## 2016-12-15 NOTE — ED Notes (Signed)
IV team at bedside 

## 2016-12-15 NOTE — ED Notes (Addendum)
RN attempted to gain IV access but could not see or feel anything in patients arms.  Pt reports that every time she comes she has to have ultrasound IV done.

## 2016-12-16 LAB — RAPID URINE DRUG SCREEN, HOSP PERFORMED
Amphetamines: NOT DETECTED
BARBITURATES: NOT DETECTED
Benzodiazepines: NOT DETECTED
COCAINE: NOT DETECTED
Opiates: NOT DETECTED
TETRAHYDROCANNABINOL: POSITIVE — AB

## 2016-12-16 LAB — URINALYSIS, ROUTINE W REFLEX MICROSCOPIC
BILIRUBIN URINE: NEGATIVE
HGB URINE DIPSTICK: NEGATIVE
KETONES UR: 20 mg/dL — AB
NITRITE: NEGATIVE
PH: 8 (ref 5.0–8.0)
Protein, ur: 100 mg/dL — AB
SPECIFIC GRAVITY, URINE: 1.021 (ref 1.005–1.030)
Squamous Epithelial / LPF: NONE SEEN

## 2016-12-16 MED ORDER — CEPHALEXIN 500 MG PO CAPS
500.0000 mg | ORAL_CAPSULE | Freq: Four times a day (QID) | ORAL | 0 refills | Status: DC
Start: 2016-12-16 — End: 2016-12-20

## 2016-12-16 MED ORDER — METOCLOPRAMIDE HCL 10 MG PO TABS: 10.0000 mg | ORAL_TABLET | Freq: Four times a day (QID) | ORAL | 0 refills | Status: DC

## 2016-12-16 MED ORDER — DEXTROSE 5 % IV SOLN
1.0000 g | Freq: Once | INTRAVENOUS | Status: AC
  Administered 2016-12-16: 1 g via INTRAVENOUS
  Filled 2016-12-16: qty 10

## 2016-12-16 MED ORDER — CAPSAICIN 0.025 % EX CREA
TOPICAL_CREAM | Freq: Once | CUTANEOUS | Status: AC
  Administered 2016-12-16: 02:00:00 via TOPICAL
  Filled 2016-12-16: qty 60

## 2016-12-17 ENCOUNTER — Encounter (HOSPITAL_COMMUNITY): Payer: Self-pay | Admitting: Family Medicine

## 2016-12-17 ENCOUNTER — Emergency Department (HOSPITAL_COMMUNITY)
Admission: EM | Admit: 2016-12-17 | Discharge: 2016-12-17 | Disposition: A | Discharge status: Home / Self Care | Payer: Medicaid Other | Attending: Emergency Medicine | Admitting: Emergency Medicine

## 2016-12-17 DIAGNOSIS — J45909 Unspecified asthma, uncomplicated: Secondary | ICD-10-CM | POA: Insufficient documentation

## 2016-12-17 DIAGNOSIS — Z91010 Allergy to peanuts: Secondary | ICD-10-CM | POA: Insufficient documentation

## 2016-12-17 DIAGNOSIS — N390 Urinary tract infection, site not specified: Secondary | ICD-10-CM | POA: Insufficient documentation

## 2016-12-17 DIAGNOSIS — Z794 Long term (current) use of insulin: Secondary | ICD-10-CM | POA: Insufficient documentation

## 2016-12-17 DIAGNOSIS — I110 Hypertensive heart disease with heart failure: Secondary | ICD-10-CM | POA: Diagnosis not present

## 2016-12-17 DIAGNOSIS — K3184 Gastroparesis: Secondary | ICD-10-CM

## 2016-12-17 DIAGNOSIS — Z79899 Other long term (current) drug therapy: Secondary | ICD-10-CM | POA: Insufficient documentation

## 2016-12-17 DIAGNOSIS — I5032 Chronic diastolic (congestive) heart failure: Secondary | ICD-10-CM | POA: Insufficient documentation

## 2016-12-17 DIAGNOSIS — E109 Type 1 diabetes mellitus without complications: Secondary | ICD-10-CM | POA: Insufficient documentation

## 2016-12-17 DIAGNOSIS — R109 Unspecified abdominal pain: Secondary | ICD-10-CM | POA: Diagnosis present

## 2016-12-17 DIAGNOSIS — R1110 Vomiting, unspecified: Secondary | ICD-10-CM | POA: Diagnosis not present

## 2016-12-17 LAB — URINALYSIS, ROUTINE W REFLEX MICROSCOPIC
Bilirubin Urine: NEGATIVE
Glucose, UA: >=500 mg/dL — AB
Hgb urine dipstick: NEGATIVE
Ketones, ur: 80 mg/dL — AB
Nitrite: NEGATIVE
PROTEIN: 100 mg/dL — AB
SPECIFIC GRAVITY, URINE: 1.02 (ref 1.005–1.030)
SQUAMOUS EPITHELIAL / LPF: NONE SEEN
pH: 6 (ref 5.0–8.0)

## 2016-12-17 LAB — COMPREHENSIVE METABOLIC PANEL
ALK PHOS: 142 U/L — AB (ref 38–126)
ALT: 13 U/L — AB (ref 14–54)
AST: 15 U/L (ref 15–41)
Albumin: 4.1 g/dL (ref 3.5–5.0)
Anion gap: 12 (ref 5–15)
BUN: 22 mg/dL — AB (ref 6–20)
CALCIUM: 9.5 mg/dL (ref 8.9–10.3)
CHLORIDE: 101 mmol/L (ref 101–111)
CO2: 22 mmol/L (ref 22–32)
CREATININE: 0.66 mg/dL (ref 0.44–1.00)
Glucose, Bld: 280 mg/dL — ABNORMAL HIGH (ref 65–99)
Potassium: 3.9 mmol/L (ref 3.5–5.1)
Sodium: 135 mmol/L (ref 135–145)
Total Bilirubin: 1.3 mg/dL — ABNORMAL HIGH (ref 0.3–1.2)
Total Protein: 8.2 g/dL — ABNORMAL HIGH (ref 6.5–8.1)

## 2016-12-17 LAB — LIPASE, BLOOD: Lipase: 17 U/L (ref 11–51)

## 2016-12-17 LAB — URINE CULTURE: Culture: NO GROWTH

## 2016-12-17 LAB — CBC
HCT: 39.5 % (ref 36.0–46.0)
Hemoglobin: 13 g/dL (ref 12.0–15.0)
MCH: 28.4 pg (ref 26.0–34.0)
MCHC: 32.9 g/dL (ref 30.0–36.0)
MCV: 86.2 fL (ref 78.0–100.0)
PLATELETS: 522 10*3/uL — AB (ref 150–400)
RBC: 4.58 MIL/uL (ref 3.87–5.11)
RDW: 15 % (ref 11.5–15.5)
WBC: 12.7 10*3/uL — AB (ref 4.0–10.5)

## 2016-12-17 LAB — I-STAT BETA HCG BLOOD, ED (MC, WL, AP ONLY)

## 2016-12-17 MED ORDER — MORPHINE SULFATE (PF) 4 MG/ML IV SOLN
4.0000 mg | Freq: Once | INTRAVENOUS | Status: AC
  Administered 2016-12-17: 4 mg via INTRAVENOUS
  Filled 2016-12-17: qty 1

## 2016-12-17 MED ORDER — MORPHINE SULFATE (PF) 4 MG/ML IV SOLN
4.0000 mg | Freq: Once | INTRAVENOUS | Status: AC
Start: 2016-12-17 — End: 2016-12-17
  Administered 2016-12-17: 4 mg via INTRAVENOUS
  Filled 2016-12-17: qty 1

## 2016-12-17 MED ORDER — METOCLOPRAMIDE HCL 5 MG/ML IJ SOLN
10.0000 mg | Freq: Once | INTRAMUSCULAR | Status: AC
  Administered 2016-12-17: 10 mg via INTRAVENOUS
  Filled 2016-12-17: qty 2

## 2016-12-17 MED ORDER — SODIUM CHLORIDE 0.9 % IV BOLUS (SEPSIS)
1000.0000 mL | Freq: Once | INTRAVENOUS | Status: AC
Start: 2016-12-17 — End: 2016-12-17
  Administered 2016-12-17: 1000 mL via INTRAVENOUS

## 2016-12-17 MED ORDER — SODIUM CHLORIDE 0.9 % IV BOLUS (SEPSIS)
1000.0000 mL | Freq: Once | INTRAVENOUS | Status: AC
  Administered 2016-12-17: 1000 mL via INTRAVENOUS

## 2016-12-17 MED ORDER — HALOPERIDOL LACTATE 5 MG/ML IJ SOLN
1.0000 mg | Freq: Once | INTRAMUSCULAR | Status: AC
  Administered 2016-12-17: 1 mg via INTRAVENOUS
  Filled 2016-12-17: qty 1

## 2016-12-17 MED ORDER — KETOROLAC TROMETHAMINE 30 MG/ML IJ SOLN
30.0000 mg | Freq: Once | INTRAMUSCULAR | Status: AC
  Administered 2016-12-17: 30 mg via INTRAVENOUS
  Filled 2016-12-17: qty 1

## 2016-12-17 MED ORDER — DIPHENHYDRAMINE HCL 50 MG/ML IJ SOLN
25.0000 mg | Freq: Once | INTRAMUSCULAR | Status: AC
  Administered 2016-12-17: 25 mg via INTRAVENOUS
  Filled 2016-12-17: qty 1

## 2016-12-17 MED ORDER — PROMETHAZINE HCL 25 MG/ML IJ SOLN
25.0000 mg | Freq: Once | INTRAMUSCULAR | Status: AC
  Administered 2016-12-17: 25 mg via INTRAVENOUS
  Filled 2016-12-17: qty 1

## 2016-12-17 NOTE — ED Notes (Signed)
Patient with multiple bruising both arms from previous IV's-attempt by Rob EDPA ultrasound IV and attempt ultrasound by this writer unsuccessful. Peripheral attempt x 2

## 2016-12-17 NOTE — ED Provider Notes (Addendum)
[facility] DEPT Provider Note   CSN: [id number] Arrival date & time: 12/17/16  0308     History   Chief Complaint Chief Complaint  Patient presents with  . Emesis    HPI [name] is a 26 y.o. female.  Patient with PMH of DM type 1 and gastroparesis presents to the ED with a chief complaint of abdominal pain and vomiting.  She was seen 2 days ago for the same.  Felt improved yesterday, but had return of her symptoms today.  She states that she now feels worse.  Has not been able to tolerate oral intake.  She denies any fever, chills, cough, or hematemesis. Denies any other associated symptoms.   The history is provided by the patient. No language interpreter was used.    Past Medical History:  Diagnosis Date  . Anxiety   . Arthritis   . Asthma   . Diabetes mellitus 2007   IDDM.  poorly controlled, multiple admits with DKA  . Gallstones   . Gastroparesis   . Heart murmur   . Hepatic steatosis 11/26/2014   and hepatomegaly  . Hypertension    NOT CURRENTLY ON ANY BP MED  . Liver mass 11/26/2014  . Pancreatitis, acute 11/26/2014    Patient Active Problem List   Diagnosis Date Noted  . Leukocytosis 10/24/2016  . N&V (nausea and vomiting) 10/23/2016  . DKA, type 1 ([place]) 09/26/2016  . Thrombocytosis ([place]) 07/08/2016  . Candiduria, asymptomatic 06/23/2016  . Hyponatremia 06/13/2016  . Hematuria 05/13/2016  . UTI (urinary tract infection) 01/22/2016  . Diarrhea 11/09/2015  . Acute urinary retention   . GERD (gastroesophageal reflux disease) 07/10/2015  . Depression with anxiety 07/10/2015  . Gastroparesis 06/22/2015  . Altered mental status 06/22/2015  . Volume depletion 06/10/2015  . Protein-calorie malnutrition, severe 06/10/2015  . Hyperglycemia   . Elevated bilirubin   . Hematemesis with nausea   . Intractable nausea and vomiting 05/28/2015  . Abdominal pain in female   . Abdominal pain 05/24/2015  . Hypertension  05/24/2015  . Dehydration   . Chronic diastolic heart failure ([place]) 04/11/2015  . Hematemesis 04/08/2015  . DKA (diabetic ketoacidoses) ([place]) 03/22/2015  . S/P laparoscopic cholecystectomy 02/11/2015  . Postextubation stridor   . Pancreatitis, acute 11/26/2014  . Volume overload 11/26/2014  . Hypokalemia 11/26/2014  . Hepatic steatosis 11/26/2014  . Liver mass 11/26/2014  . Sepsis ([place]) 11/25/2014  . Sinus tachycardia 11/25/2014  . Hypomagnesemia 11/25/2014  . Hypophosphatemia 11/25/2014  . Elevated LFTs 11/24/2014  . AKI (acute kidney injury) ([place]) 11/24/2014  . Migraine headache 11/24/2014  . Asthma 06/29/2012  . Uncontrolled type 1 diabetes mellitus ([place]) 06/19/2010  . Goiter, unspecified 06/19/2010    Past Surgical History:  Procedure Laterality Date  . CHOLECYSTECTOMY N/A 02/11/2015   Procedure: LAPAROSCOPIC CHOLECYSTECTOMY WITH INTRAOPERATIVE CHOLANGIOGRAM;  Surgeon: Greer Pickerel, MD;  Location: [facility] ORS;  Service: General;  Laterality: N/A;  . ESOPHAGOGASTRODUODENOSCOPY (EGD) WITH PROPOFOL Left 09/20/2014   Procedure: ESOPHAGOGASTRODUODENOSCOPY (EGD) WITH PROPOFOL;  Surgeon: Arta Silence, MD;  Location[facility] ENDOSCOPY;  Service: Endoscopy;  Laterality: Left;  . WISDOM TOOTH EXTRACTION      OB History    Gravida Para Term Preterm AB Living   2 1 0 1 1 1    SAB TAB Ectopic Multiple Live Births   0 1 0 0 1       Home Medications    Prior to Admission medications   Medication Sig Start Date End  Date Taking? Authorizing Provider  albuterol (PROVENTIL HFA;VENTOLIN HFA) 108 (90 Base) MCG/ACT inhaler Inhale 1-2 puffs into the lungs every 6 (six) hours as needed for wheezing or shortness of breath.    [provider]  cephALEXin (KEFLEX) 500 MG capsule Take 1 capsule (500 mg total) by mouth 4 (four) times daily. 12/16/16   Etta Quill, NP  dicyclomine (BENTYL) 20 MG tablet Take 1 tablet (20 mg total) by mouth 2 (two) times daily. 07/13/16   Volanda Napoleon, PA-C    glucagon (GLUCAGON EMERGENCY) 1 MG injection Inject 1 mg into the vein once as needed. Patient taking differently: Inject 1 mg into the vein once as needed. High BS 06/14/16   Sheikh, Omair Latif, DO  insulin aspart (NOVOLOG FLEXPEN) 100 UNIT/ML FlexPen Inject 1-10 Units into the skin 3 (three) times daily with meals. Pt uses 1 unit for every 10 grams of carbs with meals and snacks for BS greater than 50.    [provider]  insulin glargine (LANTUS) 100 UNIT/ML injection Inject 0.25 mLs (25 Units total) into the skin daily. 09/29/16   Domenic Polite, MD  lisinopril (PRINIVIL,ZESTRIL) 2.5 MG tablet Take 2.5 mg by mouth daily. 10/08/16   [provider]  metoCLOPramide (REGLAN) 10 MG tablet Take 1 tablet (10 mg total) by mouth every 6 (six) hours as needed for nausea. 07/23/16   Orlie Dakin, MD  metoCLOPramide (REGLAN) 10 MG tablet Take 1 tablet (10 mg total) by mouth every 6 (six) hours. 12/16/16   Etta Quill, NP  metoprolol tartrate (LOPRESSOR) 25 MG tablet Take 0.5 tablets (12.5 mg total) by mouth 2 (two) times daily. 05/10/16   Mariel Aloe, MD  ondansetron (ZOFRAN) 4 MG tablet Take 1 tablet (4 mg total) by mouth every 6 (six) hours. Patient taking differently: Take 4 mg by mouth every 8 (eight) hours as needed for nausea.  07/13/16   Volanda Napoleon, PA-C  oxybutynin (DITROPAN) 5 MG tablet Take 0.5 tablets (2.5 mg total) by mouth 3 (three) times daily. 10/29/16   Donne Hazel, MD  RESTASIS MULTIDOSE 0.05 % ophthalmic emulsion Place 1 drop into both eyes every 12 (twelve) hours. 09/08/16   [provider]  Vitamin D, Ergocalciferol, (DRISDOL) 50000 units CAPS capsule Take 50,000 Units by mouth once a week. On fridays 05/26/16   [provider]    Family History Family History  Problem Relation Age of Onset  . Heart disease Maternal Grandmother   . Heart disease Maternal Grandfather   . Diabetes Mother   . Hyperlipidemia Mother   . Hypertension  Father   . Heart disease Father   . Hypertension Paternal Grandmother   . Cancer Paternal Grandfather     Social History Social History   Tobacco Use  . Smoking status: Never Smoker  . Smokeless tobacco: Never Used  Substance Use Topics  . Alcohol use: No  . Drug use: Yes    Types: Marijuana     Allergies   Peanut-containing drug products; Food; and Ultram [tramadol]   Review of Systems Review of Systems  All other systems reviewed and are negative.    Physical Exam Updated Vital Signs BP (!) 131/91 (BP Location: Right Arm)   Pulse (!) 140   Temp 98.1 F (36.7 C) (Oral)   Resp 20   LMP 12/07/2016 Comment: neg preg test  SpO2 100%   Physical Exam  Constitutional: She is oriented to person, place, and time. She appears well-developed and well-nourished.  HENT:  Head: Normocephalic and atraumatic.  Eyes: Conjunctivae and EOM are normal. Pupils are equal, round, and reactive to light.  Neck: Normal range of motion. Neck supple.  Cardiovascular: Regular rhythm. Exam reveals no gallop and no friction rub.  No murmur heard. tachycardic  Pulmonary/Chest: Effort normal and breath sounds normal. No respiratory distress. She has no wheezes. She has no rales. She exhibits no tenderness.  Abdominal: Soft. Bowel sounds are normal. She exhibits no distension and no mass. There is no tenderness. There is no rebound and no guarding.  No focal abdominal tenderness, no RLQ tenderness or pain at McBurney's point, no RUQ tenderness or Murphy's sign, no left-sided abdominal tenderness, no fluid wave, or signs of peritonitis   Musculoskeletal: Normal range of motion. She exhibits no edema or tenderness.  Neurological: She is alert and oriented to person, place, and time.  Skin: Skin is warm and dry.  Psychiatric: She has a normal mood and affect. Her behavior is normal. Judgment and thought content normal.  Nursing note and vitals reviewed.    ED Treatments / Results  Labs (all  labs ordered are listed, but only abnormal results are displayed) Labs Reviewed  LIPASE, BLOOD  COMPREHENSIVE METABOLIC PANEL  CBC  URINALYSIS, ROUTINE W REFLEX MICROSCOPIC  I-STAT BETA HCG BLOOD, ED ([facility], [facility], [facility] ONLY)  CBG MONITORING, ED    EKG  EKG Interpretation None       Radiology Dg Abdomen 1 View  Result Date: 12/15/2016 CLINICAL DATA:  26 y/o F; abdominal pain. History of gastroparesis. EXAM: ABDOMEN - 1 VIEW COMPARISON:  10/23/2016 CT abdomen and pelvis. FINDINGS: The bowel gas pattern is normal. No radio-opaque calculi or other significant radiographic abnormality are seen. Right upper quadrant cholecystectomy clips. IMPRESSION: Normal bowel gas pattern. Electronically Signed   By: Kristine Garbe M.D.   On: 12/15/2016 22:22    Procedures Procedures (including critical care time)  Medications Ordered in ED Medications  sodium chloride 0.9 % bolus 1,000 mL (not administered)  morphine 4 MG/ML injection 4 mg (not administered)  promethazine (PHENERGAN) injection 25 mg (not administered)     Initial Impression / Assessment and Plan / ED Course  I have reviewed the triage vital signs and the nursing notes.  Pertinent labs & imaging results that were available during my care of the patient were reviewed by me and considered in my medical decision making (see chart for details).  Clinical Course as of Dec 22 2126  Thu Dec 17, 2016  0950 Leukocytes, UA: (!) LARGE [CG]  1014 WBC, UA: TOO NUMEROUS TO COUNT [CG]    Clinical Course User Index [CG] Kinnie Feil, PA-C   DM1 patient with gastroparesis presents with increased abdominal pain and vomiting.  Will give fluids, check labs, and treat symptoms.  Will reassess.  Delayed in getting meds and labs due to difficult obtaining venous access.   Patient feels slightly improved with pain meds.  Will continue fluids.  No evidence of DKA, anion gap is 12.  Will continue giving fluids and reassess.  Patient  signed out to Billie Lade, who will continue care.   Final Clinical Impressions(s) / ED Diagnoses   Final diagnoses:  None    ED Discharge Orders    None       Montine Circle, PA-C 12/17/16 9528    Veryl Speak, MD 12/17/16 4132    Veryl Speak, MD 12/21/16 2128

## 2016-12-17 NOTE — ED Notes (Signed)
Patient made aware that we need a urine sample. 

## 2016-12-17 NOTE — Discharge Instructions (Signed)
Continue insulin regimen. Take phenergan as needed for nausea.

## 2016-12-17 NOTE — ED Provider Notes (Signed)
Pt handed off to me by previous EDPA Browning at shift change. Please see previous note for details.   26 yo female w/ h/o T1DM, multiple DKAs, gastroparesis presents with n/v abdominal pain. Previous EDPA suspect gastroparesis. Plan is to hydrate and reassess for clinical improvement.   Lab work reviewed, unremarkable. No DKA. Only mild leukocytosis. At shift change she is tachycardic to 140 but vomiting. Afebrile. She has received morphine x 1, phenergan x 1 and 1 L IVF.   On my exam, pt is found asleep. HR 120. Reports improving nausea and abdominal pain, but requesting more antiemetics. Will order benadryl and reglan, will reassess.    0740: Reassess patient, she is asleep. Reports improvement in nausea and abdominal pain. No emesis since last antibiotic. Heart rate in the 120s.  1015: U/A just resulted, c/w recurrent UTI currently on keflex. Symptoms improved, pt requesting ginger ale.  HR 108 on final reassessment. Pt drinking second soda. She feels comfortable with discharge at this time.    Kinnie Feil, PA-C 12/17/16 1033    Veryl Speak, MD 12/17/16 [phone]

## 2016-12-17 NOTE — ED Triage Notes (Addendum)
Patient is experiencing abd pain with severe dry heaving. Relates symptoms to her history of gastroparesis. She denies she has took any medication to help relieve her symptoms. Patient was seen at [facility] ED 2 days ago for the same symptoms.

## 2016-12-18 ENCOUNTER — Other Ambulatory Visit: Payer: Self-pay

## 2016-12-18 ENCOUNTER — Emergency Department (HOSPITAL_COMMUNITY): Payer: Medicaid Other

## 2016-12-18 ENCOUNTER — Inpatient Hospital Stay (HOSPITAL_COMMUNITY)
Admission: EM | Admit: 2016-12-18 | Discharge: 2016-12-20 | DRG: 638 | Disposition: A | Discharge status: Home / Self Care | Payer: Medicaid Other | Attending: Internal Medicine | Admitting: Internal Medicine

## 2016-12-18 ENCOUNTER — Encounter (HOSPITAL_COMMUNITY): Payer: Self-pay | Admitting: Emergency Medicine

## 2016-12-18 DIAGNOSIS — R112 Nausea with vomiting, unspecified: Secondary | ICD-10-CM | POA: Diagnosis not present

## 2016-12-18 DIAGNOSIS — N390 Urinary tract infection, site not specified: Secondary | ICD-10-CM | POA: Diagnosis present

## 2016-12-18 DIAGNOSIS — G43A1 Cyclical vomiting, intractable: Secondary | ICD-10-CM | POA: Diagnosis present

## 2016-12-18 DIAGNOSIS — E860 Dehydration: Secondary | ICD-10-CM | POA: Diagnosis present

## 2016-12-18 DIAGNOSIS — J45909 Unspecified asthma, uncomplicated: Secondary | ICD-10-CM | POA: Diagnosis present

## 2016-12-18 DIAGNOSIS — E1065 Type 1 diabetes mellitus with hyperglycemia: Principal | ICD-10-CM | POA: Diagnosis present

## 2016-12-18 DIAGNOSIS — I10 Essential (primary) hypertension: Secondary | ICD-10-CM | POA: Diagnosis present

## 2016-12-18 DIAGNOSIS — K3184 Gastroparesis: Secondary | ICD-10-CM | POA: Diagnosis present

## 2016-12-18 DIAGNOSIS — R000 Tachycardia, unspecified: Secondary | ICD-10-CM | POA: Diagnosis present

## 2016-12-18 DIAGNOSIS — E1043 Type 1 diabetes mellitus with diabetic autonomic (poly)neuropathy: Secondary | ICD-10-CM | POA: Diagnosis present

## 2016-12-18 DIAGNOSIS — F1290 Cannabis use, unspecified, uncomplicated: Secondary | ICD-10-CM | POA: Diagnosis present

## 2016-12-18 DIAGNOSIS — Z91018 Allergy to other foods: Secondary | ICD-10-CM

## 2016-12-18 DIAGNOSIS — IMO0002 Reserved for concepts with insufficient information to code with codable children: Secondary | ICD-10-CM | POA: Diagnosis present

## 2016-12-18 DIAGNOSIS — Z79899 Other long term (current) drug therapy: Secondary | ICD-10-CM

## 2016-12-18 DIAGNOSIS — B3749 Other urogenital candidiasis: Secondary | ICD-10-CM | POA: Diagnosis present

## 2016-12-18 DIAGNOSIS — K219 Gastro-esophageal reflux disease without esophagitis: Secondary | ICD-10-CM | POA: Diagnosis present

## 2016-12-18 DIAGNOSIS — Z794 Long term (current) use of insulin: Secondary | ICD-10-CM

## 2016-12-18 DIAGNOSIS — R1115 Cyclical vomiting syndrome unrelated to migraine: Secondary | ICD-10-CM | POA: Diagnosis present

## 2016-12-18 HISTORY — DX: Type 1 diabetes mellitus without complications: E10.9

## 2016-12-18 HISTORY — DX: Cyclical vomiting syndrome unrelated to migraine: R11.15

## 2016-12-18 HISTORY — DX: Gastro-esophageal reflux disease without esophagitis: K21.9

## 2016-12-18 HISTORY — DX: Pneumonia, unspecified organism: J18.9

## 2016-12-18 LAB — CBC
HCT: 39.2 % (ref 36.0–46.0)
HEMOGLOBIN: 13.1 g/dL (ref 12.0–15.0)
MCH: 28.7 pg (ref 26.0–34.0)
MCHC: 33.4 g/dL (ref 30.0–36.0)
MCV: 85.8 fL (ref 78.0–100.0)
PLATELETS: 485 10*3/uL — AB (ref 150–400)
RBC: 4.57 MIL/uL (ref 3.87–5.11)
RDW: 14.6 % (ref 11.5–15.5)
WBC: 6.1 10*3/uL (ref 4.0–10.5)

## 2016-12-18 LAB — URINALYSIS, ROUTINE W REFLEX MICROSCOPIC
BILIRUBIN URINE: NEGATIVE
HGB URINE DIPSTICK: NEGATIVE
Ketones, ur: 80 mg/dL — AB
NITRITE: NEGATIVE
Protein, ur: 100 mg/dL — AB
SPECIFIC GRAVITY, URINE: 1.014 (ref 1.005–1.030)
SQUAMOUS EPITHELIAL / LPF: NONE SEEN
pH: 6 (ref 5.0–8.0)

## 2016-12-18 LAB — GLUCOSE, CAPILLARY
GLUCOSE-CAPILLARY: 150 mg/dL — AB (ref 65–99)
Glucose-Capillary: 111 mg/dL — ABNORMAL HIGH (ref 65–99)
Glucose-Capillary: 117 mg/dL — ABNORMAL HIGH (ref 65–99)
Glucose-Capillary: 204 mg/dL — ABNORMAL HIGH (ref 65–99)

## 2016-12-18 LAB — RAPID URINE DRUG SCREEN, HOSP PERFORMED
AMPHETAMINES: NOT DETECTED
BARBITURATES: NOT DETECTED
BENZODIAZEPINES: NOT DETECTED
COCAINE: NOT DETECTED
Opiates: POSITIVE — AB
TETRAHYDROCANNABINOL: POSITIVE — AB

## 2016-12-18 LAB — COMPREHENSIVE METABOLIC PANEL
ALBUMIN: 4 g/dL (ref 3.5–5.0)
ALK PHOS: 135 U/L — AB (ref 38–126)
ALT: 10 U/L — AB (ref 14–54)
ANION GAP: 12 (ref 5–15)
AST: 32 U/L (ref 15–41)
BILIRUBIN TOTAL: 1.6 mg/dL — AB (ref 0.3–1.2)
BUN: 15 mg/dL (ref 6–20)
CALCIUM: 9 mg/dL (ref 8.9–10.3)
CO2: 21 mmol/L — AB (ref 22–32)
Chloride: 98 mmol/L — ABNORMAL LOW (ref 101–111)
Creatinine, Ser: 0.72 mg/dL (ref 0.44–1.00)
GFR calc Af Amer: >60 mL/min (ref >60)
GFR calc non Af Amer: >60 mL/min (ref >60)
GLUCOSE: 215 mg/dL — AB (ref 65–99)
Potassium: 4.2 mmol/L (ref 3.5–5.1)
SODIUM: 131 mmol/L — AB (ref 135–145)
TOTAL PROTEIN: 7.7 g/dL (ref 6.5–8.1)

## 2016-12-18 LAB — LIPASE, BLOOD: Lipase: 17 U/L (ref 11–51)

## 2016-12-18 LAB — CBG MONITORING, ED
Glucose-Capillary: 208 mg/dL — ABNORMAL HIGH (ref 65–99)
Glucose-Capillary: 159 mg/dL — ABNORMAL HIGH (ref 65–99)

## 2016-12-18 LAB — CG4 I-STAT (LACTIC ACID): Lactic Acid, Venous: 1.03 mmol/L (ref 0.5–1.9)

## 2016-12-18 LAB — I-STAT BETA HCG BLOOD, ED (MC, WL, AP ONLY)

## 2016-12-18 LAB — HEMOGLOBIN A1C
Hgb A1c MFr Bld: 8.7 % — ABNORMAL HIGH (ref 4.8–5.6)
MEAN PLASMA GLUCOSE: 202.99 mg/dL

## 2016-12-18 MED ORDER — PANTOPRAZOLE SODIUM 40 MG IV SOLR
40.0000 mg | INTRAVENOUS | Status: DC
  Administered 2016-12-18: 40 mg via INTRAVENOUS
  Filled 2016-12-18 (×2): qty 40

## 2016-12-18 MED ORDER — LISINOPRIL 5 MG PO TABS
2.5000 mg | ORAL_TABLET | Freq: Every day | ORAL | Status: DC
  Administered 2016-12-18 – 2016-12-20 (×3): 2.5 mg via ORAL
  Filled 2016-12-18 (×3): qty 1

## 2016-12-18 MED ORDER — DICYCLOMINE HCL 20 MG PO TABS: 20.0000 mg | ORAL_TABLET | Freq: Two times a day (BID) | ORAL | Status: DC

## 2016-12-18 MED ORDER — INSULIN GLARGINE 100 UNIT/ML ~~LOC~~ SOLN
12.0000 [IU] | Freq: Every day | SUBCUTANEOUS | Status: DC
  Administered 2016-12-19 – 2016-12-20 (×2): 12 [IU] via SUBCUTANEOUS
  Filled 2016-12-18 (×4): qty 0.12

## 2016-12-18 MED ORDER — OXYBUTYNIN CHLORIDE 5 MG PO TABS
2.5000 mg | ORAL_TABLET | Freq: Three times a day (TID) | ORAL | Status: DC
  Administered 2016-12-18 – 2016-12-20 (×7): 2.5 mg via ORAL
  Filled 2016-12-18 (×7): qty 1

## 2016-12-18 MED ORDER — ACETAMINOPHEN 325 MG PO TABS
650.0000 mg | ORAL_TABLET | Freq: Four times a day (QID) | ORAL | Status: DC | PRN
  Administered 2016-12-19: 650 mg via ORAL
  Filled 2016-12-18: qty 2

## 2016-12-18 MED ORDER — MORPHINE SULFATE (PF) 4 MG/ML IV SOLN
4.0000 mg | Freq: Once | INTRAVENOUS | Status: AC
  Administered 2016-12-18: 4 mg via INTRAVENOUS
  Filled 2016-12-18: qty 1

## 2016-12-18 MED ORDER — METOCLOPRAMIDE HCL 5 MG/ML IJ SOLN
10.0000 mg | Freq: Once | INTRAMUSCULAR | Status: AC
  Administered 2016-12-18: 10 mg via INTRAVENOUS
  Filled 2016-12-18: qty 2

## 2016-12-18 MED ORDER — METOCLOPRAMIDE HCL 10 MG PO TABS
10.0000 mg | ORAL_TABLET | Freq: Four times a day (QID) | ORAL | Status: DC
  Administered 2016-12-18 – 2016-12-20 (×9): 10 mg via ORAL
  Filled 2016-12-18 (×9): qty 1

## 2016-12-18 MED ORDER — PROMETHAZINE HCL 25 MG/ML IJ SOLN
12.5000 mg | Freq: Four times a day (QID) | INTRAMUSCULAR | Status: DC | PRN
  Administered 2016-12-18: 12.5 mg via INTRAVENOUS
  Filled 2016-12-18: qty 1

## 2016-12-18 MED ORDER — SODIUM CHLORIDE 0.9 % IV SOLN
INTRAVENOUS | Status: DC
  Administered 2016-12-18: 13:00:00 via INTRAVENOUS

## 2016-12-18 MED ORDER — PROMETHAZINE HCL 25 MG/ML IJ SOLN
25.0000 mg | Freq: Once | INTRAMUSCULAR | Status: AC
  Administered 2016-12-18: 25 mg via INTRAVENOUS
  Filled 2016-12-18: qty 1

## 2016-12-18 MED ORDER — ALBUTEROL SULFATE (2.5 MG/3ML) 0.083% IN NEBU: 3.0000 mL | INHALATION_SOLUTION | Freq: Four times a day (QID) | RESPIRATORY_TRACT | Status: DC | PRN

## 2016-12-18 MED ORDER — ONDANSETRON HCL 4 MG/2ML IJ SOLN
4.0000 mg | Freq: Four times a day (QID) | INTRAMUSCULAR | Status: DC | PRN
  Administered 2016-12-18: 4 mg via INTRAVENOUS
  Filled 2016-12-18: qty 2

## 2016-12-18 MED ORDER — SODIUM CHLORIDE 0.9 % IV BOLUS (SEPSIS)
1000.0000 mL | Freq: Once | INTRAVENOUS | Status: AC
  Administered 2016-12-18: 1000 mL via INTRAVENOUS

## 2016-12-18 MED ORDER — ONDANSETRON HCL 4 MG PO TABS: 4.0000 mg | ORAL_TABLET | Freq: Four times a day (QID) | ORAL | Status: DC | PRN

## 2016-12-18 MED ORDER — VITAMIN D (ERGOCALCIFEROL) 1.25 MG (50000 UNIT) PO CAPS
50000.0000 [IU] | ORAL_CAPSULE | ORAL | Status: DC
  Administered 2016-12-18: 50000 [IU] via ORAL
  Filled 2016-12-18: qty 1

## 2016-12-18 MED ORDER — SENNOSIDES-DOCUSATE SODIUM 8.6-50 MG PO TABS: 1.0000 | ORAL_TABLET | Freq: Every evening | ORAL | Status: DC | PRN

## 2016-12-18 MED ORDER — KETOROLAC TROMETHAMINE 15 MG/ML IJ SOLN
15.0000 mg | Freq: Four times a day (QID) | INTRAMUSCULAR | Status: DC | PRN
Start: 2016-12-18 — End: 2016-12-20
  Administered 2016-12-19: 15 mg via INTRAVENOUS
  Filled 2016-12-18: qty 1

## 2016-12-18 MED ORDER — PROMETHAZINE HCL 25 MG/ML IJ SOLN: 25.0000 mg | Freq: Four times a day (QID) | INTRAMUSCULAR | Status: DC | PRN

## 2016-12-18 MED ORDER — LORAZEPAM 2 MG/ML IJ SOLN: 1.0000 mg | Freq: Once | INTRAMUSCULAR | Status: DC

## 2016-12-18 MED ORDER — CYCLOSPORINE 0.05 % OP EMUL
1.0000 [drp] | Freq: Two times a day (BID) | OPHTHALMIC | Status: DC
  Administered 2016-12-18 – 2016-12-19 (×2): 1 [drp] via OPHTHALMIC
  Filled 2016-12-18 (×5): qty 1

## 2016-12-18 MED ORDER — ACETAMINOPHEN 650 MG RE SUPP: 650.0000 mg | Freq: Four times a day (QID) | RECTAL | Status: DC | PRN

## 2016-12-18 MED ORDER — HALOPERIDOL LACTATE 5 MG/ML IJ SOLN: 2.5000 mg | Freq: Once | INTRAMUSCULAR | Status: DC

## 2016-12-18 MED ORDER — ONDANSETRON 4 MG PO TBDP
4.0000 mg | ORAL_TABLET | Freq: Once | ORAL | Status: AC | PRN
  Administered 2016-12-18: 4 mg via ORAL
  Filled 2016-12-18: qty 1

## 2016-12-18 MED ORDER — METOPROLOL TARTRATE 12.5 MG HALF TABLET
12.5000 mg | ORAL_TABLET | Freq: Two times a day (BID) | ORAL | Status: DC
  Administered 2016-12-18 – 2016-12-20 (×4): 12.5 mg via ORAL
  Filled 2016-12-18 (×5): qty 1

## 2016-12-18 MED ORDER — INSULIN ASPART 100 UNIT/ML ~~LOC~~ SOLN
0.0000 [IU] | Freq: Three times a day (TID) | SUBCUTANEOUS | Status: DC
  Administered 2016-12-18: 1 [IU] via SUBCUTANEOUS
  Administered 2016-12-19: 3 [IU] via SUBCUTANEOUS

## 2016-12-18 MED ORDER — ENOXAPARIN SODIUM 40 MG/0.4ML ~~LOC~~ SOLN
40.0000 mg | SUBCUTANEOUS | Status: DC
  Administered 2016-12-18 – 2016-12-19 (×2): 40 mg via SUBCUTANEOUS
  Filled 2016-12-18 (×2): qty 0.4

## 2016-12-18 MED ORDER — INSULIN ASPART 100 UNIT/ML ~~LOC~~ SOLN
0.0000 [IU] | SUBCUTANEOUS | Status: DC
  Administered 2016-12-18: 2 [IU] via SUBCUTANEOUS
  Filled 2016-12-18: qty 1

## 2016-12-18 MED ORDER — MORPHINE SULFATE (PF) 4 MG/ML IV SOLN
4.0000 mg | INTRAVENOUS | Status: DC | PRN
  Administered 2016-12-18 – 2016-12-19 (×5): 4 mg via INTRAVENOUS
  Filled 2016-12-18 (×5): qty 1

## 2016-12-18 MED ORDER — DEXTROSE 5 % IV SOLN
1.0000 g | INTRAVENOUS | Status: DC
  Administered 2016-12-18 – 2016-12-19 (×2): 1 g via INTRAVENOUS
  Filled 2016-12-18 (×3): qty 10

## 2016-12-18 MED ORDER — BISACODYL 10 MG RE SUPP: 10.0000 mg | Freq: Every day | RECTAL | Status: DC | PRN

## 2016-12-18 NOTE — Progress Notes (Addendum)
Arrived to 6n10 from ED at this time. States abd pain 8/10, denies nausea, oriented to room and surroundings.

## 2016-12-18 NOTE — ED Notes (Signed)
This RN attempted IV access without success. Pt states she usually needs ultrasound Iv access. Another RN to attempted Iv access with [place]

## 2016-12-18 NOTE — ED Triage Notes (Signed)
Patient reports generalized abdominal pain with emesis onset this evening , denies diarrhea , no fever or chills .

## 2016-12-18 NOTE — ED Notes (Signed)
ED Provider at bedside. 

## 2016-12-18 NOTE — ED Notes (Signed)
Pt unable to void at this time but pt aware of need for urine sample

## 2016-12-18 NOTE — ED Provider Notes (Signed)
[facility] EMERGENCY DEPARTMENT Provider Note   CSN: [id number] Arrival date & time: 12/18/16  0120     History   Chief Complaint Chief Complaint  Patient presents with  . Abdominal Pain    HPI [name] is a 26 y.o. female.  Patient returns with diffuse abdominal pain nausea vomiting since midnight.  This is her third visit in 3 days for recurrent nausea vomiting abdominal pain consistent with her previous episodes of gastroparesis.  Patient states she has been taking Phenergan and Reglan at home without relief.  She states compliance with her insulin.  States she has vomited multiple times unable to count the number of episodes.  Denies diarrhea or fever.  Her abdominal pain is diffuse and similar to previous episodes of gastroparesis.  No pain with urination or blood in the urine.  She was diagnosed with a UTI on November 27 and has not yet picked up her antibiotics.   The history is provided by the patient.  Abdominal Pain   Associated symptoms include nausea and vomiting. Pertinent negatives include fever, diarrhea, dysuria and headaches.    Past Medical History:  Diagnosis Date  . Anxiety   . Arthritis   . Asthma   . Diabetes mellitus 2007   IDDM.  poorly controlled, multiple admits with DKA  . Gallstones   . Gastroparesis   . Heart murmur   . Hepatic steatosis 11/26/2014   and hepatomegaly  . Hypertension    NOT CURRENTLY ON ANY BP MED  . Liver mass 11/26/2014  . Pancreatitis, acute 11/26/2014    Patient Active Problem List   Diagnosis Date Noted  . Leukocytosis 10/24/2016  . N&V (nausea and vomiting) 10/23/2016  . DKA, type 1 ([place]) 09/26/2016  . Thrombocytosis ([place]) 07/08/2016  . Candiduria, asymptomatic 06/23/2016  . Hyponatremia 06/13/2016  . Hematuria 05/13/2016  . UTI (urinary tract infection) 01/22/2016  . Diarrhea 11/09/2015  . Acute urinary retention   . GERD (gastroesophageal reflux disease) 07/10/2015  . Depression  with anxiety 07/10/2015  . Gastroparesis 06/22/2015  . Altered mental status 06/22/2015  . Volume depletion 06/10/2015  . Protein-calorie malnutrition, severe 06/10/2015  . Hyperglycemia   . Elevated bilirubin   . Hematemesis with nausea   . Intractable nausea and vomiting 05/28/2015  . Abdominal pain in female   . Abdominal pain 05/24/2015  . Hypertension 05/24/2015  . Dehydration   . Chronic diastolic heart failure ([place]) 04/11/2015  . Hematemesis 04/08/2015  . DKA (diabetic ketoacidoses) ([place]) 03/22/2015  . S/P laparoscopic cholecystectomy 02/11/2015  . Postextubation stridor   . Pancreatitis, acute 11/26/2014  . Volume overload 11/26/2014  . Hypokalemia 11/26/2014  . Hepatic steatosis 11/26/2014  . Liver mass 11/26/2014  . Sepsis ([place]) 11/25/2014  . Sinus tachycardia 11/25/2014  . Hypomagnesemia 11/25/2014  . Hypophosphatemia 11/25/2014  . Elevated LFTs 11/24/2014  . AKI (acute kidney injury) ([place]) 11/24/2014  . Migraine headache 11/24/2014  . Asthma 06/29/2012  . Uncontrolled type 1 diabetes mellitus ([place]) 06/19/2010  . Goiter, unspecified 06/19/2010    Past Surgical History:  Procedure Laterality Date  . CHOLECYSTECTOMY N/A 02/11/2015   Procedure: LAPAROSCOPIC CHOLECYSTECTOMY WITH INTRAOPERATIVE CHOLANGIOGRAM;  Surgeon: Greer Pickerel, MD;  Location: [facility];  Service: General;  Laterality: N/A;  . ESOPHAGOGASTRODUODENOSCOPY (EGD) WITH PROPOFOL Left 09/20/2014   Procedure: ESOPHAGOGASTRODUODENOSCOPY (EGD) WITH PROPOFOL;  Surgeon: Arta Silence, MD;  Location: [facility];  Service: Endoscopy;  Laterality: Left;  . WISDOM TOOTH EXTRACTION      OB  History    Gravida Para Term Preterm AB Living   2 1 0 1 1 1    SAB TAB Ectopic Multiple Live Births   0 1 0 0 1       Home Medications    Prior to Admission medications   Medication Sig Start Date End Date Taking? Authorizing Provider  albuterol (PROVENTIL HFA;VENTOLIN HFA) 108 (90 Base) MCG/ACT inhaler Inhale 1-2  puffs into the lungs every 6 (six) hours as needed for wheezing or shortness of breath.    [provider]  cephALEXin (KEFLEX) 500 MG capsule Take 1 capsule (500 mg total) by mouth 4 (four) times daily. 12/16/16   Etta Quill, NP  dicyclomine (BENTYL) 20 MG tablet Take 1 tablet (20 mg total) by mouth 2 (two) times daily. 07/13/16   Volanda Napoleon, PA-C  glucagon (GLUCAGON EMERGENCY) 1 MG injection Inject 1 mg into the vein once as needed. Patient taking differently: Inject 1 mg into the vein once as needed. High BS 06/14/16   Sheikh, Omair Latif, DO  insulin aspart (NOVOLOG FLEXPEN) 100 UNIT/ML FlexPen Inject 1-10 Units into the skin 3 (three) times daily with meals. Pt uses 1 unit for every 10 grams of carbs with meals and snacks for BS greater than 50.    [provider]  insulin glargine (LANTUS) 100 UNIT/ML injection Inject 0.25 mLs (25 Units total) into the skin daily. 09/29/16   Domenic Polite, MD  lisinopril (PRINIVIL,ZESTRIL) 2.5 MG tablet Take 2.5 mg by mouth daily. 10/08/16   [provider]  metoCLOPramide (REGLAN) 10 MG tablet Take 1 tablet (10 mg total) by mouth every 6 (six) hours as needed for nausea. 07/23/16   Orlie Dakin, MD  metoCLOPramide (REGLAN) 10 MG tablet Take 1 tablet (10 mg total) by mouth every 6 (six) hours. 12/16/16   Etta Quill, NP  metoprolol tartrate (LOPRESSOR) 25 MG tablet Take 0.5 tablets (12.5 mg total) by mouth 2 (two) times daily. 05/10/16   Mariel Aloe, MD  ondansetron (ZOFRAN) 4 MG tablet Take 1 tablet (4 mg total) by mouth every 6 (six) hours. Patient taking differently: Take 4 mg by mouth every 8 (eight) hours as needed for nausea.  07/13/16   Volanda Napoleon, PA-C  oxybutynin (DITROPAN) 5 MG tablet Take 0.5 tablets (2.5 mg total) by mouth 3 (three) times daily. 10/29/16   Donne Hazel, MD  RESTASIS MULTIDOSE 0.05 % ophthalmic emulsion Place 1 drop into both eyes every 12 (twelve) hours. 09/08/16   [provider]  Vitamin D, Ergocalciferol, (DRISDOL) 50000 units CAPS capsule Take 50,000 Units by mouth once a week. On fridays 05/26/16   [provider]    Family History Family History  Problem Relation Age of Onset  . Heart disease Maternal Grandmother   . Heart disease Maternal Grandfather   . Diabetes Mother   . Hyperlipidemia Mother   . Hypertension Father   . Heart disease Father   . Hypertension Paternal Grandmother   . Cancer Paternal Grandfather     Social History Social History   Tobacco Use  . Smoking status: Never Smoker  . Smokeless tobacco: Never Used  Substance Use Topics  . Alcohol use: No  . Drug use: Yes    Types: Marijuana     Allergies   Peanut-containing drug products; Food; and Ultram [tramadol]   Review of Systems Review of Systems  Constitutional: Positive for activity change, appetite change and fatigue. Negative for fever.  HENT: Negative  for congestion and rhinorrhea.   Eyes: Negative for visual disturbance.  Respiratory: Negative for cough, chest tightness and shortness of breath.   Gastrointestinal: Positive for abdominal pain, nausea and vomiting. Negative for diarrhea.  Genitourinary: Negative for dysuria and urgency.  Musculoskeletal: Negative for back pain and neck pain.  Skin: Negative for rash.  Neurological: Positive for weakness. Negative for dizziness, numbness and headaches.    all other systems are negative except as noted in the HPI and PMH.    Physical Exam Updated Vital Signs BP (!) 140/113 (BP Location: Right Arm)   Pulse (!) 131   Temp 99.3 F (37.4 C) (Oral)   Resp 18   LMP 12/07/2016 (Approximate)   SpO2 100%   Physical Exam  Constitutional: She is oriented to person, place, and time. She appears well-developed and well-nourished. No distress.  Emesis bag with clear emesis  HENT:  Head: Normocephalic and atraumatic.  Mouth/Throat: Oropharynx is clear and moist. No oropharyngeal exudate.  Eyes: Conjunctivae  and EOM are normal. Pupils are equal, round, and reactive to light.  Neck: Normal range of motion. Neck supple.  No meningismus.  Cardiovascular: Normal rate, normal heart sounds and intact distal pulses.  No murmur heard. Tachycardic, 130s  Pulmonary/Chest: Effort normal and breath sounds normal. No respiratory distress.  Abdominal: Soft. There is tenderness. There is no rebound and no guarding.  Mild diffuse tenderness, no guarding or rebound  Musculoskeletal: Normal range of motion. She exhibits no edema or tenderness.  Neurological: She is alert and oriented to person, place, and time. No cranial nerve deficit. She exhibits normal muscle tone. Coordination normal.  No ataxia on finger to nose bilaterally. No pronator drift. 5/5 strength throughout. CN 2-12 intact.Equal grip strength. Sensation intact.   Skin: Skin is warm.  Psychiatric: She has a normal mood and affect. Her behavior is normal.  Nursing note and vitals reviewed.    ED Treatments / Results  Labs (all labs ordered are listed, but only abnormal results are displayed) Labs Reviewed  COMPREHENSIVE METABOLIC PANEL - Abnormal; Notable for the following components:      Result Value   Sodium 131 (*)    Chloride 98 (*)    CO2 21 (*)    Glucose, Bld 215 (*)    ALT 10 (*)    Alkaline Phosphatase 135 (*)    Total Bilirubin 1.6 (*)    All other components within normal limits  CBC - Abnormal; Notable for the following components:   Platelets 485 (*)    All other components within normal limits  CBG MONITORING, ED - Abnormal; Notable for the following components:   Glucose-Capillary 208 (*)    All other components within normal limits  CBG MONITORING, ED - Abnormal; Notable for the following components:   Glucose-Capillary 159 (*)    All other components within normal limits  LIPASE, BLOOD  URINALYSIS, ROUTINE W REFLEX MICROSCOPIC  RAPID URINE DRUG SCREEN, HOSP PERFORMED  I-STAT BETA HCG BLOOD, ED ([facility], [facility], [facility] ONLY)    I-STAT CG4 LACTIC ACID, ED  I-STAT CG4 LACTIC ACID, ED    EKG  EKG Interpretation None       Radiology Dg Abdomen Acute W/chest  Result Date: 12/18/2016 CLINICAL DATA:  Vomiting. EXAM: DG ABDOMEN ACUTE W/ 1V CHEST COMPARISON:  Multiple prior exams most recently radiograph 3 days prior 12/15/2016, abdominal CT 10/23/2016 FINDINGS: The cardiomediastinal contours are normal. The lungs are clear. There is no free intra-abdominal air. No dilated bowel loops  to suggest obstruction. Cholecystectomy clips in the right upper quadrant. Small volume of stool throughout the colon. No radiopaque calculi. No acute osseous abnormalities are seen. IMPRESSION: Negative abdominal radiographs.  No acute cardiopulmonary disease. Electronically Signed   By: Jeb Levering M.D.   On: 12/18/2016 03:48    Procedures Procedures (including critical care time)  Medications Ordered in ED Medications  sodium chloride 0.9 % bolus 1,000 mL (not administered)  promethazine (PHENERGAN) injection 25 mg (not administered)  morphine 4 MG/ML injection 4 mg (not administered)  metoCLOPramide (REGLAN) injection 10 mg (not administered)  ondansetron (ZOFRAN-ODT) disintegrating tablet 4 mg (4 mg Oral Given 12/18/16 0137)     Initial Impression / Assessment and Plan / ED Course  I have reviewed the triage vital signs and the nursing notes.  Pertinent labs & imaging results that were available during my care of the patient were reviewed by me and considered in my medical decision making (see chart for details).    Patient with diabetes and gastroparesis returns with domino pain nausea and vomiting.  Recent visits for same.  She has not filled her prescription for Keflex.  Abdomen soft without peritoneal signs.  Patient will be given IV fluids and symptom control.  Labs are reviewed.  There is no evidence of DKA.  Patient given IV fluids and symptom control.  She is asking for narcotic pain medication.  Discussed  with patient that this could make her gastroparesis worse.  She states this has worked in the past for her.  She is given a one-time dose for symptom control. Heart rate remains elevated in the 120s.  Additional IV fluids given. Vomiting has subsided and abdomen is soft without peritoneal signs.  No evidence of DKA.  Patient remains nauseated.  Discussed symptom control including cessation of marijuana use as this is likely exacerbating her gastroparesis and chronic abdominal pain and vomiting.  Remains tachycardic and nauseated. Agreeable to observation admission. D/w Dr. Alcario Drought.  Final Clinical Impressions(s) / ED Diagnoses   Final diagnoses:  Intractable vomiting with nausea, unspecified vomiting type  Gastroparesis    ED Discharge Orders    None       Kimberlyn Quiocho, Annie Main, MD 12/18/16 [phone]

## 2016-12-18 NOTE — ED Notes (Signed)
Patient transported to X-ray 

## 2016-12-18 NOTE — ED Notes (Signed)
Kayla in lab advised pts initial lactic acid was normal at 1.2.

## 2016-12-18 NOTE — H&P (Signed)
History and Physical    Sherrill Buikema NOI:[id number] DOB: 09/19/1990 DOA: 12/18/2016   PCP: Vicenta Aly, FNP   Patient coming from:  Home    Chief Complaint: Abdominal pain, intractable nausea and vomiting  HPI: [name] is a 26 y.o. female with medical history significant for HTN, anxiety, asthma, diabetes type 1, history of gastroparesis and pancreatitis, presenting to the ED with intractable nausea and non bilious,  non bloody vomiting.  Of note, she has been seen in this hospital with similar complain multiple times, last in November 03, 2016.  She reports taking Phenergan and Reglan at home, without relief.  She reports being in compliance with her insulin.  Denies diarrhea or fever.  Her abdominal pain is diffuse, similar to prior episodes of gastroparesis.  She denies any dysuria or gross hematuria.  Of note, she was diagnosed with a UTI November 27, but yet has not picked up her antibiotics.She denies any shortness of breath, or chest pain.  She denies any cough, fever or chills.  She denies any sick contacts.  Last time she had medic one was 2 days ago.  She denies any tobacco or cocaine or any other recreational drug use   ED Course:  BP 120/83   Pulse (!) 103   Temp 99.3 F (37.4 C) (Oral)   Resp 16   LMP 12/07/2016 (Approximate)   SpO2 100%   On presentation she was tachycardic, dehydrated, nauseated. Received Phenergan 25 mg IV x3, along with Zofran, IV fluids 2 L normal saline,  Reglan, with some relief Sodium 131, was 135 on November 29, in the setting of volume loss Bicarb 21, stable from prior lab White count is 6.1, was 12.7 on 1129  creatinine 0.72 Glucose 215, 159 ALT 10 Alkaline phosphatase 135, improved from 142 on 12/17/2016 Total bilirubin 1.6, in the setting of dehydration, history of hepatic steatosis Platelets 485 Abdominal x-ray with no acute findings UA with large leukocytes, negative for nitrite UDS positive for THC and  opiates  Review of Systems:  As per HPI otherwise all other systems reviewed and are negative  Past Medical History:  Diagnosis Date  . Anxiety   . Arthritis   . Asthma   . Diabetes mellitus 2007   IDDM.  poorly controlled, multiple admits with DKA  . Gallstones   . Gastroparesis   . Heart murmur   . Hepatic steatosis 11/26/2014   and hepatomegaly  . Hypertension    NOT CURRENTLY ON ANY BP MED  . Liver mass 11/26/2014  . Pancreatitis, acute 11/26/2014    Past Surgical History:  Procedure Laterality Date  . CHOLECYSTECTOMY N/A 02/11/2015   Procedure: LAPAROSCOPIC CHOLECYSTECTOMY WITH INTRAOPERATIVE CHOLANGIOGRAM;  Surgeon: Greer Pickerel, MD;  Location: [facility];  Service: General;  Laterality: N/A;  . ESOPHAGOGASTRODUODENOSCOPY (EGD) WITH PROPOFOL Left 09/20/2014   Procedure: ESOPHAGOGASTRODUODENOSCOPY (EGD) WITH PROPOFOL;  Surgeon: Arta Silence, MD;  Location: [facility] ENDOSCOPY;  Service: Endoscopy;  Laterality: Left;  . WISDOM TOOTH EXTRACTION      Social History Social History   Socioeconomic History  . Marital status: Single    Spouse name: Not on file  . Number of children: 1  . Years of education: 54  . Highest education level: Not on file  Social Needs  . Financial resource strain: Not on file  . Food insecurity - worry: Not on file  . Food insecurity - inability: Not on file  . Transportation needs - medical: Not on file  . Transportation needs -  non-medical: Not on file  Occupational History  . Occupation: Unemployed.  Tobacco Use  . Smoking status: Never Smoker  . Smokeless tobacco: Never Used  Substance and Sexual Activity  . Alcohol use: No  . Drug use: Yes    Types: Marijuana  . Sexual activity: Not Currently  Other Topics Concern  . Not on file  Social History Narrative   Lives with her mother.  Unemployed.  Trying to qualify for disability.     Allergies  Allergen Reactions  . Peanut-Containing Drug Products Swelling and Other (See Comments)     Reaction:  Swelling of mouth and lips   . 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   Reaction:  Swelling of mouth and lips   . Ultram [Tramadol] Itching    Family History  Problem Relation Age of Onset  . Heart disease Maternal Grandmother   . Heart disease Maternal Grandfather   . Diabetes Mother   . Hyperlipidemia Mother   . Hypertension Father   . Heart disease Father   . Hypertension Paternal Grandmother   . Cancer Paternal Grandfather       Prior to Admission medications   Medication Sig Start Date End Date Taking? Authorizing Provider  albuterol (PROVENTIL HFA;VENTOLIN HFA) 108 (90 Base) MCG/ACT inhaler Inhale 1-2 puffs into the lungs every 6 (six) hours as needed for wheezing or shortness of breath.   Yes [provider]  cephALEXin (KEFLEX) 500 MG capsule Take 1 capsule (500 mg total) by mouth 4 (four) times daily. 12/16/16  Yes Etta Quill, NP  dicyclomine (BENTYL) 20 MG tablet Take 1 tablet (20 mg total) by mouth 2 (two) times daily. 07/13/16  Yes Volanda Napoleon, PA-C  glucagon (GLUCAGON EMERGENCY) 1 MG injection Inject 1 mg into the vein once as needed. Patient taking differently: Inject 1 mg into the vein once as needed. High BS 06/14/16  Yes Sheikh, Omair Latif, DO  insulin aspart (NOVOLOG FLEXPEN) 100 UNIT/ML FlexPen Inject 1-10 Units into the skin 3 (three) times daily with meals. Pt uses 1 unit for every 10 grams of carbs with meals and snacks for BS greater than 50.   Yes [provider]  insulin glargine (LANTUS) 100 UNIT/ML injection Inject 0.25 mLs (25 Units total) into the skin daily. 09/29/16  Yes Domenic Polite, MD  lisinopril (PRINIVIL,ZESTRIL) 2.5 MG tablet Take 2.5 mg by mouth daily. 10/08/16  Yes [provider]  metoCLOPramide (REGLAN) 10 MG tablet Take 1 tablet (10 mg total) by mouth every 6 (six) hours. 12/16/16  Yes Etta Quill, NP  metoprolol tartrate (LOPRESSOR) 25 MG tablet Take 0.5 tablets  (12.5 mg total) by mouth 2 (two) times daily. 05/10/16  Yes Mariel Aloe, MD  ondansetron (ZOFRAN) 4 MG tablet Take 1 tablet (4 mg total) by mouth every 6 (six) hours. Patient taking differently: Take 4 mg by mouth every 8 (eight) hours as needed for nausea.  07/13/16  Yes Providence Lanius A, PA-C  oxybutynin (DITROPAN) 5 MG tablet Take 0.5 tablets (2.5 mg total) by mouth 3 (three) times daily. 10/29/16  Yes Donne Hazel, MD  RESTASIS MULTIDOSE 0.05 % ophthalmic emulsion Place 1 drop into both eyes every 12 (twelve) hours. 09/08/16  Yes [provider]  Vitamin D, Ergocalciferol, (DRISDOL) 50000 units CAPS capsule Take 50,000 Units by mouth once a week. On fridays 05/26/16  Yes [provider]  metoCLOPramide (REGLAN) 10 MG tablet Take 1 tablet (10  mg total) by mouth every 6 (six) hours as needed for nausea. Patient not taking: Reported on 12/18/2016 07/23/16   Orlie Dakin, MD    Physical Exam:  Vitals:   12/18/16 0545 12/18/16 0600 12/18/16 0630 12/18/16 0700  BP:  113/75 123/81 120/83  Pulse: (!) 106 (!) 102 (!) 104 (!) 103  Resp: 16 14 13 16   Temp:      TempSrc:      SpO2: 99% 100% 100% 100%   Constitutional: NAD, calm, uncomfortable due to nausea and vomiting, although this is improving Eyes: PERRL, lids and conjunctivae normal ENMT: Mucous membranes are moist, without exudate or lesions  Neck: normal, supple, no masses, no thyromegaly Respiratory: clear to auscultation bilaterally, no wheezing, no crackles. Normal respiratory effort  Cardiovascular: Tacky rate and rhythm,  murmur, rubs or gallops. No extremity edema. 2+ pedal pulses. No carotid bruits.  Abdomen: Soft, mild diffuse tenderness, no suprapubic tenderness  No hepatosplenomegaly. Bowel sounds positive.  Musculoskeletal: no clubbing / cyanosis. Moves all extremities Skin: no jaundice, No lesions.  Neurologic: Sensation intact  Strength equal in all extremities Psychiatric:   Alert and oriented x 3.   Flat affect    Labs on Admission: I have personally reviewed following labs and imaging studies  CBC: Recent Labs  Lab 12/15/16 1958 12/17/16 0446 12/18/16 0146  WBC 11.0* 12.7* 6.1  NEUTROABS 9.6*  --   --   HGB 14.1 13.0 13.1  HCT 41.7 39.5 39.2  MCV 86.5 86.2 85.8  PLT 532* 522* 485*    Basic Metabolic Panel: Recent Labs  Lab 12/15/16 1958 12/17/16 0446 12/18/16 0146  NA 137 135 131*  K 4.1 3.9 4.2  CL 100* 101 98*  CO2 25 22 21*  GLUCOSE 242* 280* 215*  BUN 20 22* 15  CREATININE 0.64 0.66 0.72  CALCIUM 9.8 9.5 9.0    GFR: CrCl cannot be calculated (Unknown ideal weight.).  Liver Function Tests: Recent Labs  Lab 12/17/16 0446 12/18/16 0146  AST 15 32  ALT 13* 10*  ALKPHOS 142* 135*  BILITOT 1.3* 1.6*  PROT 8.2* 7.7  ALBUMIN 4.1 4.0   Recent Labs  Lab 12/17/16 0446 12/18/16 0146  LIPASE 17 17   No results for input(s): AMMONIA in the last 168 hours.  Coagulation Profile: No results for input(s): INR, PROTIME in the last 168 hours.  Cardiac Enzymes: No results for input(s): CKTOTAL, CKMB, CKMBINDEX, TROPONINI in the last 168 hours.  BNP (last 3 results) No results for input(s): PROBNP in the last 8760 hours.  HbA1C: No results for input(s): HGBA1C in the last 72 hours.  CBG: Recent Labs  Lab 12/18/16 0202 12/18/16 0601  GLUCAP 208* 159*    Lipid Profile: No results for input(s): CHOL, HDL, LDLCALC, TRIG, CHOLHDL, LDLDIRECT in the last 72 hours.  Thyroid Function Tests: No results for input(s): TSH, T4TOTAL, FREET4, T3FREE, THYROIDAB in the last 72 hours.  Anemia Panel: No results for input(s): VITAMINB12, FOLATE, FERRITIN, TIBC, IRON, RETICCTPCT in the last 72 hours.  Urine analysis:    Component Value Date/Time   COLORURINE YELLOW 12/18/2016 0450   APPEARANCEUR TURBID (A) 12/18/2016 0450   LABSPEC 1.014 12/18/2016 0450   PHURINE 6.0 12/18/2016 0450   GLUCOSEU >=500 (A) 12/18/2016 0450   GLUCOSEU >=1000 11/07/2012 1205    HGBUR NEGATIVE 12/18/2016 0450   BILIRUBINUR NEGATIVE 12/18/2016 0450   KETONESUR 80 (A) 12/18/2016 0450   PROTEINUR 100 (A) 12/18/2016 0450   UROBILINOGEN 0.2 11/24/2014 1045  NITRITE NEGATIVE 12/18/2016 0450   LEUKOCYTESUR LARGE (A) 12/18/2016 0450    Sepsis Labs: @LABRCNTIP (procalcitonin:4,lacticidven:4) ) Recent Results (from the past 240 hour(s))  Urine Culture     Status: None   Collection Time: 12/15/16 11:30 PM  Result Value Ref Range Status   Specimen Description URINE, CLEAN CATCH  Final   Special Requests cephalosporin  Final   Culture   Final    NO GROWTH Performed at [facility], [place] [address], [place], [place] [address]    Report Status 12/17/2016 FINAL  Final     Radiological Exams on Admission: Dg Abdomen Acute W/chest  Result Date: 12/18/2016 CLINICAL DATA:  Vomiting. EXAM: DG ABDOMEN ACUTE W/ 1V CHEST COMPARISON:  Multiple prior exams most recently radiograph 3 days prior 12/15/2016, abdominal CT 10/23/2016 FINDINGS: The cardiomediastinal contours are normal. The lungs are clear. There is no free intra-abdominal air. No dilated bowel loops to suggest obstruction. Cholecystectomy clips in the right upper quadrant. Small volume of stool throughout the colon. No radiopaque calculi. No acute osseous abnormalities are seen. IMPRESSION: Negative abdominal radiographs.  No acute cardiopulmonary disease. Electronically Signed   By: Jeb Levering M.D.   On: 12/18/2016 03:48    EKG: Independently reviewed.  Assessment/Plan Active Problems:   UTI (urinary tract infection)   Intractable cyclical vomiting syndrome   Uncontrolled type 1 diabetes mellitus (HCC)   Asthma   Sinus tachycardia   Dehydration   Hypertension   Intractable nausea and vomiting   GERD (gastroesophageal reflux disease)   Candiduria, asymptomatic  Intractable Cyclical  Nausea and vomiting setting of THC, may be a component of diabetes gastroparesis . UDS + THC and opioids last THC  2 days ago,  afebrile, nontoxic appearing. Exam with mild diffuse tenderness. No leukocytosis.  DG abdomen negative for acute findings  Admit to medsurg obs  Bowel rest  IV Zofran and Phenergan, Reglan  Hold Bentyl as it may reduce the effects of reglan making it less therapeutic  IV fluids at 100 cc/h  Advance diet as tolerated Cautious use of pain meds, K pad, Tylenol, Toradol  GERD, no acute symptoms Continue PPI   UTI suggested by UA +  leukocytes in urine. WBC is normal Received IV fluids in ED. patient reports has been diagnosed 2 days ago with UTI, but has been unable to pick up the antibiotic Of note, latest cultures yielded multiple species Currently asymptomatic .   F/u urine culture Ceftriaxone IV  Follow CBC in am  IVF as above Continue Ditropan  Asymptomatic candiduria, no signs or symptoms. Continue to monitor   Type II Diabetes type I current blood sugar level is 215 .  No signs or symptoms of DKA.  Anion gap is normal Hgb A1C Lantus at half dose, SSI  History of asthma, no acute exacerbation.  O2 sats normal at room air Continue albuterol as needed  History of hepatic steatosis, no acute abnormalities bilirubin and alkaline phosphatase essentially unchanged from prior. .  Enzymes are without significant changes. Continue IV hydration Monitor C met   Sinus tachycardia, due to dehydration and vomiting.  No acute cardiac issues. She received 2 L of IV fluids, continue IV fluids EKG in a.m.   Hypertension BP 120/83   Pulse 103  Continue home anti-hypertensive medications with BB and ACEI    DVT prophylaxis:  Lovenox Code Status:    Full  Family Communication:  Discussed with patient Disposition Plan: Expect patient to be discharged to home after  condition improves Consults called:    None Admission status: Medsurg obs       Sharene Butters, PA-C [facility]   12/18/2016, 7:38 AM

## 2016-12-18 NOTE — ED Notes (Signed)
Report given to 6N RN, nurse on floor advised patient may come up to unit at this time and does not need to wait until shift change.

## 2016-12-18 NOTE — Progress Notes (Signed)
Pharmacy Antibiotic Note  [name] is a 26 y.o. female admitted on 12/18/2016 with UTI.  Pharmacy has been consulted for Rocephin dosing.  Plan: Rocephin 1g IV Q24H.  Pharmacy will sign off.  Temp (24hrs), Avg:99.3 F (37.4 C), Min:99.3 F (37.4 C), Max:99.3 F (37.4 C)  Recent Labs  Lab 12/15/16 1958 12/17/16 0446 12/18/16 0146  WBC 11.0* 12.7* 6.1  CREATININE 0.64 0.66 0.72    CrCl cannot be calculated (Unknown ideal weight.).    Allergies  Allergen Reactions  . Peanut-Containing Drug Products Swelling and Other (See Comments)    Reaction:  Swelling of mouth and lips   . 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   Reaction:  Swelling of mouth and lips   . Ultram [Tramadol] Itching     Thank you for allowing pharmacy to be a part of this patient's care.  Wynona Neat, PharmD, BCPS  12/18/2016 7:21 AM

## 2016-12-19 DIAGNOSIS — Z794 Long term (current) use of insulin: Secondary | ICD-10-CM | POA: Diagnosis not present

## 2016-12-19 DIAGNOSIS — E1065 Type 1 diabetes mellitus with hyperglycemia: Secondary | ICD-10-CM | POA: Diagnosis not present

## 2016-12-19 DIAGNOSIS — R112 Nausea with vomiting, unspecified: Secondary | ICD-10-CM

## 2016-12-19 DIAGNOSIS — I10 Essential (primary) hypertension: Secondary | ICD-10-CM | POA: Diagnosis present

## 2016-12-19 DIAGNOSIS — K3184 Gastroparesis: Secondary | ICD-10-CM

## 2016-12-19 DIAGNOSIS — F1290 Cannabis use, unspecified, uncomplicated: Secondary | ICD-10-CM | POA: Diagnosis present

## 2016-12-19 DIAGNOSIS — G43A1 Cyclical vomiting, intractable: Secondary | ICD-10-CM | POA: Diagnosis present

## 2016-12-19 DIAGNOSIS — E860 Dehydration: Secondary | ICD-10-CM

## 2016-12-19 DIAGNOSIS — Z79899 Other long term (current) drug therapy: Secondary | ICD-10-CM | POA: Diagnosis not present

## 2016-12-19 DIAGNOSIS — R000 Tachycardia, unspecified: Secondary | ICD-10-CM | POA: Diagnosis present

## 2016-12-19 DIAGNOSIS — K219 Gastro-esophageal reflux disease without esophagitis: Secondary | ICD-10-CM | POA: Diagnosis present

## 2016-12-19 DIAGNOSIS — Z91018 Allergy to other foods: Secondary | ICD-10-CM | POA: Diagnosis not present

## 2016-12-19 DIAGNOSIS — J45909 Unspecified asthma, uncomplicated: Secondary | ICD-10-CM | POA: Diagnosis present

## 2016-12-19 DIAGNOSIS — E1043 Type 1 diabetes mellitus with diabetic autonomic (poly)neuropathy: Secondary | ICD-10-CM | POA: Diagnosis present

## 2016-12-19 DIAGNOSIS — B3749 Other urogenital candidiasis: Secondary | ICD-10-CM | POA: Diagnosis present

## 2016-12-19 LAB — COMPREHENSIVE METABOLIC PANEL
ALT: 12 U/L — ABNORMAL LOW (ref 14–54)
ANION GAP: 11 (ref 5–15)
AST: 14 U/L — ABNORMAL LOW (ref 15–41)
Albumin: 2.6 g/dL — ABNORMAL LOW (ref 3.5–5.0)
Alkaline Phosphatase: 100 U/L (ref 38–126)
BUN: 10 mg/dL (ref 6–20)
CALCIUM: 8.2 mg/dL — AB (ref 8.9–10.3)
CO2: 17 mmol/L — ABNORMAL LOW (ref 22–32)
CREATININE: 0.84 mg/dL (ref 0.44–1.00)
Chloride: 106 mmol/L (ref 101–111)
GFR calc non Af Amer: >60 mL/min (ref >60)
Glucose, Bld: 265 mg/dL — ABNORMAL HIGH (ref 65–99)
Potassium: 3.5 mmol/L (ref 3.5–5.1)
SODIUM: 134 mmol/L — AB (ref 135–145)
Total Bilirubin: 1 mg/dL (ref 0.3–1.2)
Total Protein: 5.3 g/dL — ABNORMAL LOW (ref 6.5–8.1)

## 2016-12-19 LAB — CBC
HCT: 31.2 % — ABNORMAL LOW (ref 36.0–46.0)
Hemoglobin: 10 g/dL — ABNORMAL LOW (ref 12.0–15.0)
MCH: 27.8 pg (ref 26.0–34.0)
MCHC: 32.1 g/dL (ref 30.0–36.0)
MCV: 86.7 fL (ref 78.0–100.0)
PLATELETS: 329 10*3/uL (ref 150–400)
RBC: 3.6 MIL/uL — AB (ref 3.87–5.11)
RDW: 14.3 % (ref 11.5–15.5)
WBC: 5.5 10*3/uL (ref 4.0–10.5)

## 2016-12-19 LAB — GLUCOSE, CAPILLARY
GLUCOSE-CAPILLARY: 248 mg/dL — AB (ref 65–99)
GLUCOSE-CAPILLARY: 264 mg/dL — AB (ref 65–99)
GLUCOSE-CAPILLARY: 289 mg/dL — AB (ref 65–99)
GLUCOSE-CAPILLARY: 295 mg/dL — AB (ref 65–99)
GLUCOSE-CAPILLARY: 355 mg/dL — AB (ref 65–99)
Glucose-Capillary: 246 mg/dL — ABNORMAL HIGH (ref 65–99)
Glucose-Capillary: 234 mg/dL — ABNORMAL HIGH (ref 65–99)

## 2016-12-19 MED ORDER — PANTOPRAZOLE SODIUM 40 MG PO TBEC
40.0000 mg | DELAYED_RELEASE_TABLET | Freq: Every day | ORAL | Status: DC
  Administered 2016-12-19: 40 mg via ORAL
  Filled 2016-12-19: qty 1

## 2016-12-19 MED ORDER — SODIUM CHLORIDE 0.9 % IV SOLN
INTRAVENOUS | Status: AC
  Administered 2016-12-19 – 2016-12-20 (×2): via INTRAVENOUS

## 2016-12-19 MED ORDER — INSULIN ASPART 100 UNIT/ML ~~LOC~~ SOLN
0.0000 [IU] | Freq: Three times a day (TID) | SUBCUTANEOUS | Status: DC
  Administered 2016-12-19: 5 [IU] via SUBCUTANEOUS
  Administered 2016-12-19: 15 [IU] via SUBCUTANEOUS
  Administered 2016-12-20: 5 [IU] via SUBCUTANEOUS
  Administered 2016-12-20: 8 [IU] via SUBCUTANEOUS

## 2016-12-19 MED ORDER — INSULIN ASPART 100 UNIT/ML ~~LOC~~ SOLN
0.0000 [IU] | Freq: Every day | SUBCUTANEOUS | Status: DC
  Administered 2016-12-19: 3 [IU] via SUBCUTANEOUS

## 2016-12-19 NOTE — Progress Notes (Addendum)
PROGRESS NOTE   [name]  FWY:[id number]    DOB: Jun 06, 1990    DOA: 12/18/2016  PCP: Vicenta Aly, FNP   I have briefly reviewed patients previous medical records in [facility]  Brief Narrative:  26 year old female with PMH of type 1 diabetes since age 49 complicated by gastroparesis, recurrent cyclical vomiting, HTN, asthma, anxiety, pancreatitis, THC use, presented with intractable nausea, nonbloody emesis, abdominal pain without diarrhea. Admitted for diabetic gastroparesis flare. Improving.   Assessment & Plan:   Active Problems:   Uncontrolled type 1 diabetes mellitus (HCC)   Asthma   Sinus tachycardia   Dehydration   Hypertension   Intractable nausea and vomiting   GERD (gastroesophageal reflux disease)   UTI (urinary tract infection)   Candiduria, asymptomatic   Intractable cyclical vomiting syndrome   1. Intractable nausea and vomiting/diabetic gastroparesis flare: May have been worsened by [facility] use. Treated supportively with bowel rest, IV fluids, antiemetics. Clinically improved. No further nausea or emesis since 11/30. Tolerated full liquids for breakfast. Gradually advance diet. Avoid opioids. Mobilize. THC cessation counseled. Lipase normal. KUB is negative. 2. Dehydration: Improved. Reduce IV fluids. Encourage oral intake. 3. Asymptomatic pyuria: No urinary symptoms. Recent urine culture 11/27 negative. Discontinued ceftriaxone. 4. THC use: Cessation counseled. 5. Uncontrolled type I DM: Continue current dose of Lantus 12 units daily and added NovoLog SSI. Monitor closely and may need to titrate these medications. A1c 8.7 suggests poor outpatient control. No DKA. 6. Anemia:? Dilutional. Follow CBC in a.m.  7. Essential hypertension: Controlled. She is on lisinopril but not sure if this is for hypertension or proteinuria. However she is on metoprolol as well.   DVT prophylaxis: Lovenox Code Status: Full Family Communication: None at  bedside Disposition: DC home possibly 12/2.   Consultants:  None   Procedures:  None  Antimicrobials:  Discontinued ceftriaxone    Subjective: No nausea or vomiting since yesterday. No abdominal pain. Last BM 3 days ago. Passing flatus. Tolerated   ROS: Negative  Objective:  Vitals:   12/18/16 2050 12/19/16 0432 12/19/16 1041 12/19/16 1355  BP: (!) 147/100 105/69 102/71 117/73  Pulse: (!) 128 (!) 102 98 (!) 50  Resp: 18 17  18   Temp: 99.4 F (37.4 C) 98.2 F (36.8 C)  99.5 F (37.5 C)  TempSrc: Oral Oral  Oral  SpO2: 100% 100%  98%  Weight:      Height:        Examination: Patient was interviewed and examined with her female RN in the room.  General exam: Pleasant young female, moderately built and nourished, lying comfortably supine in bed. Respiratory system: Clear to auscultation. Respiratory effort normal. Cardiovascular system: S1 & S2 heard, RRR. No JVD, murmurs, rubs, gallops or clicks. No pedal edema. Gastrointestinal system: Abdomen is nondistended, soft and nontender. No organomegaly or masses felt. Normal bowel sounds heard. Central nervous system: Alert and oriented. No focal neurological deficits. Extremities: Symmetric 5 x 5 power. Skin: No rashes, lesions or ulcers Psychiatry: Judgement and insight appear normal. Mood & affect appropriate.     Data Reviewed: I have personally reviewed following labs and imaging studies  CBC: Recent Labs  Lab 12/15/16 1958 12/17/16 0446 12/18/16 0146 12/19/16 0522  WBC 11.0* 12.7* 6.1 5.5  NEUTROABS 9.6*  --   --   --   HGB 14.1 13.0 13.1 10.0*  HCT 41.7 39.5 39.2 31.2*  MCV 86.5 86.2 85.8 86.7  PLT 532* 522* 485* 277   Basic Metabolic Panel: Recent  Labs  Lab 12/15/16 1958 12/17/16 0446 12/18/16 0146 12/19/16 0522  NA 137 135 131* 134*  K 4.1 3.9 4.2 3.5  CL 100* 101 98* 106  CO2 25 22 21* 17*  GLUCOSE 242* 280* 215* 265*  BUN 20 22* 15 10  CREATININE 0.64 0.66 0.72 0.84  CALCIUM 9.8 9.5 9.0  8.2*   Liver Function Tests: Recent Labs  Lab 12/17/16 0446 12/18/16 0146 12/19/16 0522  AST 15 32 14*  ALT 13* 10* 12*  ALKPHOS 142* 135* 100  BILITOT 1.3* 1.6* 1.0  PROT 8.2* 7.7 5.3*  ALBUMIN 4.1 4.0 2.6*   HbA1C: Recent Labs    12/18/16 0704  HGBA1C 8.7*   CBG: Recent Labs  Lab 12/19/16 0047 12/19/16 0428 12/19/16 0809 12/19/16 1232 12/19/16 1621  GLUCAP 264* 246* 248* 355* 234*    Recent Results (from the past 240 hour(s))  Urine Culture     Status: None   Collection Time: 12/15/16 11:30 PM  Result Value Ref Range Status   Specimen Description URINE, CLEAN CATCH  Final   Special Requests cephalosporin  Final   Culture   Final    NO GROWTH Performed at [facility], [place] [address], [place], [place] [address]    Report Status 12/17/2016 FINAL  Final         Radiology Studies: Dg Abdomen Acute W/chest  Result Date: 12/18/2016 CLINICAL DATA:  Vomiting. EXAM: DG ABDOMEN ACUTE W/ 1V CHEST COMPARISON:  Multiple prior exams most recently radiograph 3 days prior 12/15/2016, abdominal CT 10/23/2016 FINDINGS: The cardiomediastinal contours are normal. The lungs are clear. There is no free intra-abdominal air. No dilated bowel loops to suggest obstruction. Cholecystectomy clips in the right upper quadrant. Small volume of stool throughout the colon. No radiopaque calculi. No acute osseous abnormalities are seen. IMPRESSION: Negative abdominal radiographs.  No acute cardiopulmonary disease. Electronically Signed   By: Jeb Levering M.D.   On: 12/18/2016 03:48        Scheduled Meds: . cycloSPORINE  1 drop Both Eyes Q12H  . enoxaparin (LOVENOX) injection  40 mg Subcutaneous Q24H  . insulin aspart  0-15 Units Subcutaneous TID WC  . insulin aspart  0-5 Units Subcutaneous QHS  . insulin glargine  12 Units Subcutaneous Daily  . lisinopril  2.5 mg Oral Daily  . metoCLOPramide  10 mg Oral Q6H  . metoprolol tartrate  12.5 mg Oral BID  . oxybutynin  2.5 mg  Oral TID  . pantoprazole  40 mg Oral QHS  . Vitamin D (Ergocalciferol)  50,000 Units Oral Weekly   Continuous Infusions: . sodium chloride 50 mL/hr at 12/19/16 1041     LOS: 0 days     Vernell Leep, MD, FACP, [facility]. [facility] Pager [phone]  If 7PM-7AM, please contact night-coverage [email or web address] Password [facility] 12/19/2016, 4:24 PM

## 2016-12-20 DIAGNOSIS — E1065 Type 1 diabetes mellitus with hyperglycemia: Principal | ICD-10-CM

## 2016-12-20 DIAGNOSIS — I10 Essential (primary) hypertension: Secondary | ICD-10-CM

## 2016-12-20 LAB — BASIC METABOLIC PANEL
ANION GAP: 6 (ref 5–15)
BUN: 8 mg/dL (ref 6–20)
CHLORIDE: 107 mmol/L (ref 101–111)
CO2: 21 mmol/L — ABNORMAL LOW (ref 22–32)
Calcium: 8.3 mg/dL — ABNORMAL LOW (ref 8.9–10.3)
Creatinine, Ser: 0.56 mg/dL (ref 0.44–1.00)
GFR calc Af Amer: >60 mL/min (ref >60)
GFR calc non Af Amer: >60 mL/min (ref >60)
Glucose, Bld: 309 mg/dL — ABNORMAL HIGH (ref 65–99)
POTASSIUM: 3.6 mmol/L (ref 3.5–5.1)
SODIUM: 134 mmol/L — AB (ref 135–145)

## 2016-12-20 LAB — CBC
HCT: 31.3 % — ABNORMAL LOW (ref 36.0–46.0)
HEMOGLOBIN: 10.3 g/dL — AB (ref 12.0–15.0)
MCH: 28.1 pg (ref 26.0–34.0)
MCHC: 32.9 g/dL (ref 30.0–36.0)
MCV: 85.5 fL (ref 78.0–100.0)
Platelets: 326 10*3/uL (ref 150–400)
RBC: 3.66 MIL/uL — AB (ref 3.87–5.11)
RDW: 14.2 % (ref 11.5–15.5)
WBC: 5 10*3/uL (ref 4.0–10.5)

## 2016-12-20 LAB — GLUCOSE, CAPILLARY
GLUCOSE-CAPILLARY: 290 mg/dL — AB (ref 65–99)
Glucose-Capillary: 219 mg/dL — ABNORMAL HIGH (ref 65–99)

## 2016-12-20 MED ORDER — INSULIN GLARGINE 100 UNIT/ML ~~LOC~~ SOLN: 10.0000 [IU] | Freq: Every day | SUBCUTANEOUS | Status: DC

## 2016-12-20 MED ORDER — GLUCAGON (RDNA) 1 MG IJ KIT: 1.0000 mg | PACK | Freq: Once | INTRAMUSCULAR | Status: DC | PRN

## 2016-12-20 MED ORDER — ONDANSETRON HCL 4 MG PO TABS: 4.0000 mg | ORAL_TABLET | Freq: Three times a day (TID) | ORAL | Status: DC | PRN

## 2016-12-20 NOTE — Discharge Instructions (Signed)

## 2016-12-20 NOTE — Discharge Summary (Addendum)
Physician Discharge Summary  [name] KGU:[id number] DOB: February 03, 1990  PCP: Vicenta Aly, FNP  Admit date: 12/18/2016 Discharge date: 12/20/2016  Recommendations for Outpatient Follow-up:  1. Vicenta Aly, FNP/PCP in 5 days with repeat labs (CBC & BMP). 2. Dr. Francetta Found, Endocrinology in 1 week.  Home Health: None Equipment/Devices: None    Discharge Condition: Improved and stable  CODE STATUS: Full  Diet recommendation: Heart healthy & diabetic diet.  Discharge Diagnoses:  Active Problems:   Uncontrolled type 1 diabetes mellitus (HCC)   Asthma   Sinus tachycardia   Dehydration   Hypertension   Intractable nausea and vomiting   GERD (gastroesophageal reflux disease)   UTI (urinary tract infection)   Candiduria, asymptomatic   Intractable cyclical vomiting syndrome   Brief Summary: 26 year old female with PMH of type 1 diabetes since age 54 complicated by gastroparesis, recurrent cyclical vomiting, HTN, asthma, anxiety, pancreatitis, THC use, presented with intractable nausea, nonbloody emesis, abdominal pain without diarrhea. Admitted for diabetic gastroparesis flare. Improving.   Assessment & Plan:   1. Intractable nausea and vomiting/diabetic gastroparesis flare: May have been worsened by [facility] use. Treated supportively with bowel rest, IV fluids, antiemetics. Clinically improved. No further nausea or emesis since 11/30. THC cessation counseled. Lipase normal. KUB is negative. Diet was advanced and she has tolerated soft diet. Had a BM yesterday. She will be discharged on her prior home medications. 2. Dehydration:  resolved after IV fluids. 3. Asymptomatic pyuria: No urinary symptoms. Recent urine culture 11/27 negative. Discontinued ceftriaxone. 4. THC use: Cessation counseled. 5. Uncontrolled type I DM: A1c 8.7 suggests poor outpatient control. No DKA. Patient confirms that she takes Lantus 10 units daily and NovoLog flex been as sliding scale based on  carb counting. She reports checking blood sugars at home up to 6 times daily, fasting blood sugars usually range between 90s-120s but subsequent blood sugars in the mid 100s to 200s. She also states that whenever she has acute illness like current admission, her blood sugars tend to go high. Discussed in detail with her regarding her elevated A1c suggesting poor outpatient control and advised her to follow up closely with her endocrinologist regarding further management. She verbalized understanding. She has never had to use glucagon and indicates that the prescription is for IM and not IV. 6. Anemia:? Dilutional. Stable. Follow up CBC as outpatient in a few days. 7. Essential hypertension: Controlled. She is on lisinopril but not sure if this is for hypertension or proteinuria. However she is on metoprolol as well.    Consultants:  None   Procedures:  None    Discharge Instructions  Discharge Instructions    Activity as tolerated - No restrictions   Complete by:  As directed    Call MD for:  persistant nausea and vomiting   Complete by:  As directed    Call MD for:  severe uncontrolled pain   Complete by:  As directed    Diet - low sodium heart healthy   Complete by:  As directed    Diet Carb Modified   Complete by:  As directed        Medication List    STOP taking these medications   cephALEXin 500 MG capsule Commonly known as:  KEFLEX     TAKE these medications   albuterol 108 (90 Base) MCG/ACT inhaler Commonly known as:  PROVENTIL HFA;VENTOLIN HFA Inhale 1-2 puffs into the lungs every 6 (six) hours as needed for wheezing or shortness of breath.  dicyclomine 20 MG tablet Commonly known as:  BENTYL Take 1 tablet (20 mg total) by mouth 2 (two) times daily.   glucagon 1 MG injection Commonly known as:  GLUCAGON EMERGENCY Inject 1 mg into the muscle once as needed (For very low blood sugars. May repeat in 15-20 minutes if needed.). What changed:    how to take  this  reasons to take this   insulin glargine 100 UNIT/ML injection Commonly known as:  LANTUS Inject 0.1 mLs (10 Units total) into the skin daily.   lisinopril 2.5 MG tablet Commonly known as:  PRINIVIL,ZESTRIL Take 2.5 mg by mouth daily.   metoCLOPramide 10 MG tablet Commonly known as:  REGLAN Take 1 tablet (10 mg total) by mouth every 6 (six) hours. What changed:  Another medication with the same name was removed. Continue taking this medication, and follow the directions you see here.   metoprolol tartrate 25 MG tablet Commonly known as:  LOPRESSOR Take 0.5 tablets (12.5 mg total) by mouth 2 (two) times daily.   NOVOLOG FLEXPEN 100 UNIT/ML FlexPen Generic drug:  insulin aspart Inject 1-10 Units into the skin 3 (three) times daily with meals. Pt uses 1 unit for every 10 grams of carbs with meals and snacks for BS greater than 50. Sliding scale 1 units/50 when CBG > 150   ondansetron 4 MG tablet Commonly known as:  ZOFRAN Take 1 tablet (4 mg total) by mouth every 8 (eight) hours as needed for nausea.   oxybutynin 5 MG tablet Commonly known as:  DITROPAN Take 0.5 tablets (2.5 mg total) by mouth 3 (three) times daily.   RESTASIS MULTIDOSE 0.05 % ophthalmic emulsion Generic drug:  cycloSPORINE Place 1 drop into both eyes every 12 (twelve) hours.   Vitamin D (Ergocalciferol) 50000 units Caps capsule Commonly known as:  DRISDOL Take 50,000 Units by mouth once a week. On [employer], [place], [place]. Schedule an appointment as soon as possible for a visit in 5 day(s).   Specialty:  Nurse Practitioner Why:  To be seen with repeat labs (CBC & BMP). Contact information: [place] [address] [phone]        Gerome Apley, MD. Schedule an appointment as soon as possible for a visit in 1 week(s).   Specialty:  Endocrinology Why:  Follow-up regarding better management of your diabetes. Contact  information: [address] [place] [address] [phone]          Allergies  Allergen Reactions  . Peanut-Containing Drug Products Swelling and Other (See Comments)    Reaction:  Swelling of mouth and lips   . 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   Reaction:  Swelling of mouth and lips   . Ultram [Tramadol] Itching      Procedures/Studies:  Dg Abdomen Acute W/chest  Result Date: 12/18/2016 CLINICAL DATA:  Vomiting. EXAM: DG ABDOMEN ACUTE W/ 1V CHEST COMPARISON:  Multiple prior exams most recently radiograph 3 days prior 12/15/2016, abdominal CT 10/23/2016 FINDINGS: The cardiomediastinal contours are normal. The lungs are clear. There is no free intra-abdominal air. No dilated bowel loops to suggest obstruction. Cholecystectomy clips in the right upper quadrant. Small volume of stool throughout the colon. No radiopaque calculi. No acute osseous abnormalities are seen. IMPRESSION: Negative abdominal radiographs.  No acute cardiopulmonary disease. Electronically Signed   By: Fonnie Birkenhead.D.  On: 12/18/2016 03:48      Subjective: Patient was interviewed and examined with her female RN in the room. Reports feeling fine. No nausea, vomiting or abdominal pain since evening of admission. Has tolerated soft diet without complaints. Had a BM yesterday.  Discharge Exam:  Vitals:   12/19/16 1041 12/19/16 1355 12/19/16 2050 12/20/16 0426  BP: 102/71 117/73 (!) 104/58 124/82  Pulse: 98 (!) 50 96 86  Resp:  18 18 18   Temp:  99.5 F (37.5 C) 99.7 F (37.6 C) 98.6 F (37 C)  TempSrc:  Oral Oral Oral  SpO2:  98% 100% 100%  Weight:      Height:        General exam: Pleasant young female, moderately built and nourished, lying comfortably supine in bed. Oral mucosa moist. Respiratory system: Clear to auscultation. Respiratory effort normal. Cardiovascular system: S1 & S2 heard, RRR. No JVD, murmurs, rubs, gallops or  clicks. No pedal edema. Gastrointestinal system: Abdomen is nondistended, soft and nontender. No organomegaly or masses felt. Normal bowel sounds heard. Central nervous system: Alert and oriented. No focal neurological deficits. Extremities: Symmetric 5 x 5 power. Skin: No rashes, lesions or ulcers Psychiatry: Judgement and insight appear normal. Mood & affect appropriate.       The results of significant diagnostics from this hospitalization (including imaging, microbiology, ancillary and laboratory) are listed below for reference.     Microbiology: Recent Results (from the past 240 hour(s))  Urine Culture     Status: None   Collection Time: 12/15/16 11:30 PM  Result Value Ref Range Status   Specimen Description URINE, CLEAN CATCH  Final   Special Requests cephalosporin  Final   Culture   Final    NO GROWTH Performed at [facility], [address], [place], [place] [address]    Report Status 12/17/2016 FINAL  Final     Labs: CBC: Recent Labs  Lab 12/15/16 1958 12/17/16 0446 12/18/16 0146 12/19/16 0522 12/20/16 0540  WBC 11.0* 12.7* 6.1 5.5 5.0  NEUTROABS 9.6*  --   --   --   --   HGB 14.1 13.0 13.1 10.0* 10.3*  HCT 41.7 39.5 39.2 31.2* 31.3*  MCV 86.5 86.2 85.8 86.7 85.5  PLT 532* 522* 485* 329 270   Basic Metabolic Panel: Recent Labs  Lab 12/15/16 1958 12/17/16 0446 12/18/16 0146 12/19/16 0522 12/20/16 0540  NA 137 135 131* 134* 134*  K 4.1 3.9 4.2 3.5 3.6  CL 100* 101 98* 106 107  CO2 25 22 21* 17* 21*  GLUCOSE 242* 280* 215* 265* 309*  BUN 20 22* 15 10 8   CREATININE 0.64 0.66 0.72 0.84 0.56  CALCIUM 9.8 9.5 9.0 8.2* 8.3*   Liver Function Tests: Recent Labs  Lab 12/17/16 0446 12/18/16 0146 12/19/16 0522  AST 15 32 14*  ALT 13* 10* 12*  ALKPHOS 142* 135* 100  BILITOT 1.3* 1.6* 1.0  PROT 8.2* 7.7 5.3*  ALBUMIN 4.1 4.0 2.6*   CBG: Recent Labs  Lab 12/19/16 1621 12/19/16 2052 12/19/16 2211 12/20/16 0740 12/20/16 1211  GLUCAP 234*  289* 295* 290* 219*   Hgb A1c Recent Labs    12/18/16 0704  HGBA1C 8.7*   Urinalysis    Component Value Date/Time   COLORURINE YELLOW 12/18/2016 0450   APPEARANCEUR TURBID (A) 12/18/2016 0450   LABSPEC 1.014 12/18/2016 0450   PHURINE 6.0 12/18/2016 0450   GLUCOSEU >=500 (A) 12/18/2016 0450   GLUCOSEU >=1000 11/07/2012 1205   HGBUR  NEGATIVE 12/18/2016 0450   BILIRUBINUR NEGATIVE 12/18/2016 0450   KETONESUR 80 (A) 12/18/2016 0450   PROTEINUR 100 (A) 12/18/2016 0450   UROBILINOGEN 0.2 11/24/2014 1045   NITRITE NEGATIVE 12/18/2016 0450   LEUKOCYTESUR LARGE (A) 12/18/2016 0450      Time coordinating discharge: Less than 30 minutes  SIGNED:  Vernell Leep, MD, FACP, [facility]. [facility] Pager [phone]  If 7PM-7AM, please contact night-coverage [email or web address] Password [email or web address] 12/20/2016, 12:57 PM

## 2017-01-21 ENCOUNTER — Encounter (HOSPITAL_COMMUNITY): Payer: Self-pay | Admitting: *Deleted

## 2017-01-21 ENCOUNTER — Other Ambulatory Visit: Payer: Self-pay

## 2017-01-21 ENCOUNTER — Inpatient Hospital Stay (HOSPITAL_COMMUNITY)
Admission: EM | Admit: 2017-01-21 | Discharge: 2017-01-29 | DRG: 074 | Disposition: A | Discharge status: Home / Self Care | Payer: Medicaid Other | Attending: Internal Medicine | Admitting: Internal Medicine

## 2017-01-21 DIAGNOSIS — R112 Nausea with vomiting, unspecified: Secondary | ICD-10-CM | POA: Diagnosis not present

## 2017-01-21 DIAGNOSIS — E1065 Type 1 diabetes mellitus with hyperglycemia: Secondary | ICD-10-CM

## 2017-01-21 DIAGNOSIS — R338 Other retention of urine: Secondary | ICD-10-CM

## 2017-01-21 DIAGNOSIS — J45909 Unspecified asthma, uncomplicated: Secondary | ICD-10-CM | POA: Diagnosis present

## 2017-01-21 DIAGNOSIS — R339 Retention of urine, unspecified: Secondary | ICD-10-CM

## 2017-01-21 DIAGNOSIS — E860 Dehydration: Secondary | ICD-10-CM | POA: Diagnosis present

## 2017-01-21 DIAGNOSIS — K3184 Gastroparesis: Secondary | ICD-10-CM | POA: Diagnosis present

## 2017-01-21 DIAGNOSIS — IMO0002 Reserved for concepts with insufficient information to code with codable children: Secondary | ICD-10-CM | POA: Diagnosis present

## 2017-01-21 DIAGNOSIS — G43A0 Cyclical vomiting, not intractable: Secondary | ICD-10-CM

## 2017-01-21 DIAGNOSIS — N179 Acute kidney failure, unspecified: Secondary | ICD-10-CM | POA: Diagnosis present

## 2017-01-21 DIAGNOSIS — Z79899 Other long term (current) drug therapy: Secondary | ICD-10-CM

## 2017-01-21 DIAGNOSIS — E1043 Type 1 diabetes mellitus with diabetic autonomic (poly)neuropathy: Principal | ICD-10-CM | POA: Diagnosis present

## 2017-01-21 DIAGNOSIS — R109 Unspecified abdominal pain: Secondary | ICD-10-CM | POA: Diagnosis present

## 2017-01-21 DIAGNOSIS — F1210 Cannabis abuse, uncomplicated: Secondary | ICD-10-CM | POA: Diagnosis present

## 2017-01-21 DIAGNOSIS — F419 Anxiety disorder, unspecified: Secondary | ICD-10-CM | POA: Diagnosis present

## 2017-01-21 DIAGNOSIS — I10 Essential (primary) hypertension: Secondary | ICD-10-CM | POA: Diagnosis present

## 2017-01-21 DIAGNOSIS — E1143 Type 2 diabetes mellitus with diabetic autonomic (poly)neuropathy: Secondary | ICD-10-CM | POA: Diagnosis present

## 2017-01-21 DIAGNOSIS — E1010 Type 1 diabetes mellitus with ketoacidosis without coma: Secondary | ICD-10-CM | POA: Diagnosis present

## 2017-01-21 DIAGNOSIS — K219 Gastro-esophageal reflux disease without esophagitis: Secondary | ICD-10-CM | POA: Diagnosis present

## 2017-01-21 HISTORY — DX: Gastroparesis: K31.84

## 2017-01-21 HISTORY — DX: Type 2 diabetes mellitus with diabetic autonomic (poly)neuropathy: E11.43

## 2017-01-21 LAB — URINALYSIS, ROUTINE W REFLEX MICROSCOPIC
BACTERIA UA: NONE SEEN
BILIRUBIN URINE: NEGATIVE
Glucose, UA: >=500 mg/dL — AB
HGB URINE DIPSTICK: NEGATIVE
KETONES UR: 80 mg/dL — AB
NITRITE: NEGATIVE
PROTEIN: 30 mg/dL — AB
Specific Gravity, Urine: 1.021 (ref 1.005–1.030)
pH: 7 (ref 5.0–8.0)

## 2017-01-21 LAB — CBC
HEMATOCRIT: 43.7 % (ref 36.0–46.0)
HEMOGLOBIN: 13.9 g/dL (ref 12.0–15.0)
MCH: 28.5 pg (ref 26.0–34.0)
MCHC: 31.8 g/dL (ref 30.0–36.0)
MCV: 89.7 fL (ref 78.0–100.0)
Platelets: 384 10*3/uL (ref 150–400)
RBC: 4.87 MIL/uL (ref 3.87–5.11)
RDW: 14.7 % (ref 11.5–15.5)
WBC: 10.5 10*3/uL (ref 4.0–10.5)

## 2017-01-21 LAB — I-STAT BETA HCG BLOOD, ED (MC, WL, AP ONLY)
I-stat hCG, quantitative: <5 m[IU]/mL (ref <5)
I-stat hCG, quantitative: <5 m[IU]/mL (ref <5)

## 2017-01-21 LAB — HEPATIC FUNCTION PANEL
ALK PHOS: 127 U/L — AB (ref 38–126)
ALT: 19 U/L (ref 14–54)
AST: 34 U/L (ref 15–41)
Albumin: 4.1 g/dL (ref 3.5–5.0)
BILIRUBIN DIRECT: 0.1 mg/dL (ref 0.1–0.5)
BILIRUBIN TOTAL: 0.7 mg/dL (ref 0.3–1.2)
Indirect Bilirubin: 0.6 mg/dL (ref 0.3–0.9)
Total Protein: 7.7 g/dL (ref 6.5–8.1)

## 2017-01-21 LAB — LIPASE, BLOOD: Lipase: 18 U/L (ref 11–51)

## 2017-01-21 LAB — BASIC METABOLIC PANEL
ANION GAP: 11 (ref 5–15)
BUN: 15 mg/dL (ref 6–20)
CO2: 26 mmol/L (ref 22–32)
Calcium: 10 mg/dL (ref 8.9–10.3)
Chloride: 105 mmol/L (ref 101–111)
Creatinine, Ser: 0.62 mg/dL (ref 0.44–1.00)
GFR calc Af Amer: >60 mL/min (ref >60)
GFR calc non Af Amer: >60 mL/min (ref >60)
GLUCOSE: 105 mg/dL — AB (ref 65–99)
POTASSIUM: 4 mmol/L (ref 3.5–5.1)
Sodium: 142 mmol/L (ref 135–145)

## 2017-01-21 LAB — I-STAT CG4 LACTIC ACID, ED
Lactic Acid, Venous: 3.04 mmol/L (ref 0.5–1.9)
Lactic Acid, Venous: 2.16 mmol/L (ref 0.5–1.9)

## 2017-01-21 LAB — I-STAT VENOUS BLOOD GAS, ED
ACID-BASE DEFICIT: 4 mmol/L — AB (ref 0.0–2.0)
Bicarbonate: 20.8 mmol/L (ref 20.0–28.0)
O2 SAT: 79 %
TCO2: 22 mmol/L (ref 22–32)
pCO2, Ven: 35.4 mmHg — ABNORMAL LOW (ref 44.0–60.0)
pH, Ven: 7.376 (ref 7.250–7.430)
pO2, Ven: 44 mmHg (ref 32.0–45.0)

## 2017-01-21 LAB — RAPID URINE DRUG SCREEN, HOSP PERFORMED
Amphetamines: NOT DETECTED
BARBITURATES: NOT DETECTED
BENZODIAZEPINES: NOT DETECTED
COCAINE: NOT DETECTED
OPIATES: POSITIVE — AB
TETRAHYDROCANNABINOL: POSITIVE — AB

## 2017-01-21 LAB — CBG MONITORING, ED
Glucose-Capillary: 107 mg/dL — ABNORMAL HIGH (ref 65–99)
Glucose-Capillary: 144 mg/dL — ABNORMAL HIGH (ref 65–99)

## 2017-01-21 MED ORDER — ONDANSETRON HCL 4 MG/2ML IJ SOLN
4.0000 mg | Freq: Four times a day (QID) | INTRAMUSCULAR | Status: DC | PRN
  Administered 2017-01-22 – 2017-01-28 (×9): 4 mg via INTRAVENOUS
  Filled 2017-01-21 (×11): qty 2

## 2017-01-21 MED ORDER — KETOROLAC TROMETHAMINE 15 MG/ML IJ SOLN
15.0000 mg | Freq: Once | INTRAMUSCULAR | Status: AC
  Administered 2017-01-22: 15 mg via INTRAVENOUS
  Filled 2017-01-21: qty 1

## 2017-01-21 MED ORDER — INSULIN GLARGINE 100 UNIT/ML ~~LOC~~ SOLN
10.0000 [IU] | Freq: Every day | SUBCUTANEOUS | Status: DC
  Administered 2017-01-22 – 2017-01-23 (×2): 10 [IU] via SUBCUTANEOUS
  Filled 2017-01-21 (×3): qty 0.1

## 2017-01-21 MED ORDER — DEXTROSE 5 % IV SOLN
1.0000 g | Freq: Once | INTRAVENOUS | Status: AC
  Administered 2017-01-22: 1 g via INTRAVENOUS
  Filled 2017-01-21: qty 10

## 2017-01-21 MED ORDER — ONDANSETRON HCL 4 MG/2ML IJ SOLN
4.0000 mg | Freq: Once | INTRAMUSCULAR | Status: AC
  Administered 2017-01-21: 4 mg via INTRAVENOUS
  Filled 2017-01-21: qty 2

## 2017-01-21 MED ORDER — SODIUM CHLORIDE 0.9 % IV BOLUS (SEPSIS)
1000.0000 mL | Freq: Once | INTRAVENOUS | Status: AC
  Administered 2017-01-21: 1000 mL via INTRAVENOUS

## 2017-01-21 MED ORDER — SODIUM CHLORIDE 0.9 % IV SOLN
1000.0000 mL | INTRAVENOUS | Status: DC
  Administered 2017-01-21 (×4): 1000 mL via INTRAVENOUS

## 2017-01-21 MED ORDER — MORPHINE SULFATE (PF) 4 MG/ML IV SOLN
6.0000 mg | Freq: Once | INTRAVENOUS | Status: AC
  Administered 2017-01-21: 6 mg via INTRAVENOUS
  Filled 2017-01-21: qty 2

## 2017-01-21 MED ORDER — DICYCLOMINE HCL 20 MG PO TABS
20.0000 mg | ORAL_TABLET | Freq: Two times a day (BID) | ORAL | Status: DC
  Administered 2017-01-22 – 2017-01-29 (×16): 20 mg via ORAL
  Filled 2017-01-21 (×16): qty 1

## 2017-01-21 MED ORDER — ACETAMINOPHEN 325 MG PO TABS
650.0000 mg | ORAL_TABLET | Freq: Four times a day (QID) | ORAL | Status: DC | PRN
  Administered 2017-01-22 – 2017-01-27 (×8): 650 mg via ORAL
  Filled 2017-01-21 (×7): qty 2

## 2017-01-21 MED ORDER — OXYBUTYNIN CHLORIDE 5 MG PO TABS
2.5000 mg | ORAL_TABLET | Freq: Three times a day (TID) | ORAL | Status: DC
  Administered 2017-01-22 – 2017-01-27 (×16): 2.5 mg via ORAL
  Filled 2017-01-21 (×4): qty 1
  Filled 2017-01-21: qty 0.5
  Filled 2017-01-21 (×6): qty 1
  Filled 2017-01-21: qty 0.5
  Filled 2017-01-21 (×2): qty 1
  Filled 2017-01-21: qty 0.5
  Filled 2017-01-21 (×3): qty 1

## 2017-01-21 MED ORDER — ALBUTEROL SULFATE (2.5 MG/3ML) 0.083% IN NEBU: 2.5000 mg | INHALATION_SOLUTION | Freq: Four times a day (QID) | RESPIRATORY_TRACT | Status: DC | PRN

## 2017-01-21 MED ORDER — CAPSICUM OLEORESIN 0.025 % EX CREA
TOPICAL_CREAM | Freq: Once | CUTANEOUS | Status: AC
  Administered 2017-01-21: 1 via TOPICAL
  Filled 2017-01-21: qty 60

## 2017-01-21 MED ORDER — METOCLOPRAMIDE HCL 5 MG/ML IJ SOLN
5.0000 mg | Freq: Once | INTRAMUSCULAR | Status: AC
  Administered 2017-01-22: 5 mg via INTRAVENOUS
  Filled 2017-01-21: qty 2

## 2017-01-21 MED ORDER — CAPSAICIN 0.075 % EX CREA
TOPICAL_CREAM | Freq: Once | CUTANEOUS | Status: DC
Start: 2017-01-21 — End: 2017-01-21
  Filled 2017-01-21 (×2): qty 60

## 2017-01-21 MED ORDER — SODIUM CHLORIDE 0.9 % IV SOLN
INTRAVENOUS | Status: AC
  Administered 2017-01-21: 1000 mL via INTRAVENOUS
  Administered 2017-01-22: 13:00:00 via INTRAVENOUS

## 2017-01-21 MED ORDER — INSULIN ASPART 100 UNIT/ML ~~LOC~~ SOLN
0.0000 [IU] | Freq: Three times a day (TID) | SUBCUTANEOUS | Status: DC
  Administered 2017-01-22: 2 [IU] via SUBCUTANEOUS
  Administered 2017-01-22 – 2017-01-23 (×2): 3 [IU] via SUBCUTANEOUS
  Administered 2017-01-23: 7 [IU] via SUBCUTANEOUS
  Administered 2017-01-23 – 2017-01-24 (×2): 5 [IU] via SUBCUTANEOUS
  Administered 2017-01-24: 9 [IU] via SUBCUTANEOUS
  Administered 2017-01-24: 5 [IU] via SUBCUTANEOUS
  Administered 2017-01-25: 2 [IU] via SUBCUTANEOUS
  Administered 2017-01-25: 9 [IU] via SUBCUTANEOUS
  Administered 2017-01-25: 2 [IU] via SUBCUTANEOUS
  Administered 2017-01-26: 9 [IU] via SUBCUTANEOUS
  Administered 2017-01-26: 3 [IU] via SUBCUTANEOUS
  Filled 2017-01-21 (×2): qty 1

## 2017-01-21 MED ORDER — ENOXAPARIN SODIUM 40 MG/0.4ML ~~LOC~~ SOLN
40.0000 mg | Freq: Every day | SUBCUTANEOUS | Status: DC
  Administered 2017-01-22 – 2017-01-29 (×8): 40 mg via SUBCUTANEOUS
  Filled 2017-01-21 (×8): qty 0.4

## 2017-01-21 MED ORDER — METOPROLOL TARTRATE 12.5 MG HALF TABLET
12.5000 mg | ORAL_TABLET | Freq: Two times a day (BID) | ORAL | Status: DC
  Administered 2017-01-22 – 2017-01-29 (×16): 12.5 mg via ORAL
  Filled 2017-01-21 (×16): qty 1

## 2017-01-21 MED ORDER — ACETAMINOPHEN 650 MG RE SUPP: 650.0000 mg | Freq: Four times a day (QID) | RECTAL | Status: DC | PRN

## 2017-01-21 MED ORDER — HALOPERIDOL LACTATE 5 MG/ML IJ SOLN
5.0000 mg | Freq: Once | INTRAMUSCULAR | Status: AC
  Administered 2017-01-21: 5 mg via INTRAVENOUS
  Filled 2017-01-21: qty 1

## 2017-01-21 MED ORDER — ONDANSETRON HCL 4 MG PO TABS
4.0000 mg | ORAL_TABLET | Freq: Four times a day (QID) | ORAL | Status: DC | PRN
  Administered 2017-01-25 (×2): 4 mg via ORAL
  Filled 2017-01-21 (×2): qty 1

## 2017-01-21 NOTE — ED Triage Notes (Signed)
Pt reported to"pass out this morning". Pt has hx of gastroparesis. Pt reports lower abdominal pain and vomiting for 1 hour. Pt is also a diabetic.

## 2017-01-21 NOTE — H&P (Signed)
History and Physical    [name] YOV:[id number] DOB: 1990-09-02 DOA: 01/21/2017  PCP: Vicenta Aly, FNP  Patient coming from: Home.  Chief Complaint: Nausea vomiting.  HPI: [name] is a 27 y.o. female with known history of diabetes mellitus type 1 and hypertension presents to the ER with complaints of nausea vomiting.  Has been having nausea vomiting since this morning.  Had at least 2 episodes of diarrhea is self-limited.  Also has benign crampy abdominal pain.  Denies any recent travel or sick contacts.  Denies any fever chills.  Has been admitted previously for similar symptoms attributed to likely from diabetic gastroparesis or marijuana abuse.  Patient admits to have taken marijuana 2 days ago.  ED Course: On exam patient abdomen appears benign.  Liver function test and lipase were normal.  Patient continued to have nausea and vomiting in the ER.  Was given antiemetics and admitted for further management.  Patient states she had taken her Lantus in the morning.  Patient is not in DKA at this time.  UA shows features concerning for UTI.  Was placed on ceftriaxone.  Review of Systems: As per HPI, rest all negative.   Past Medical History:  Diagnosis Date  . Anxiety   . Arthritis    "hands, feet, knees" (12/18/2016)  . Asthma   . Gallstones   . Gastroparesis   . GERD (gastroesophageal reflux disease)   . Heart murmur   . Hepatic steatosis 11/26/2014   and hepatomegaly  . Hypertension    hx (12/18/2016)  . Intractable cyclical vomiting syndrome    Archie Endo 12/18/2016  . Liver mass 11/26/2014  . Pancreatitis, acute 11/26/2014  . Pneumonia    "as a teen X 1" (12/18/2016)  . Type I diabetes mellitus ([place]) 2007   IDDM.  poorly controlled, multiple admits with DKA    Past Surgical History:  Procedure Laterality Date  . CHOLECYSTECTOMY N/A 02/11/2015   Procedure: LAPAROSCOPIC CHOLECYSTECTOMY WITH INTRAOPERATIVE CHOLANGIOGRAM;  Surgeon: Greer Pickerel, MD;   Location: [facility] ORS;  Service: General;  Laterality: N/A;  . ESOPHAGOGASTRODUODENOSCOPY (EGD) WITH PROPOFOL Left 09/20/2014   Procedure: ESOPHAGOGASTRODUODENOSCOPY (EGD) WITH PROPOFOL;  Surgeon: Arta Silence, MD;  Location: [facility] ENDOSCOPY;  Service: Endoscopy;  Laterality: Left;  . WISDOM TOOTH EXTRACTION       reports that  has never smoked. she has never used smokeless tobacco. She reports that she uses drugs. Drug: Marijuana. She reports that she does not drink alcohol.  Allergies  Allergen Reactions  . Peanut-Containing Drug Products Swelling and Other (See Comments)    Reaction:  Swelling of mouth and lips   . 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   Reaction:  Swelling of mouth and lips   . Ultram [Tramadol] Itching    Family History  Problem Relation Age of Onset  . Heart disease Maternal Grandmother   . Heart disease Maternal Grandfather   . Diabetes Mother   . Hyperlipidemia Mother   . Hypertension Father   . Heart disease Father   . Hypertension Paternal Grandmother   . Cancer Paternal Grandfather     Prior to Admission medications   Medication Sig Start Date End Date Taking? Authorizing Provider  albuterol (PROVENTIL HFA;VENTOLIN HFA) 108 (90 Base) MCG/ACT inhaler Inhale 1-2 puffs into the lungs every 6 (six) hours as needed for wheezing or shortness of breath.   Yes [provider]  dicyclomine (BENTYL) 20 MG tablet Take 1 tablet (  20 mg total) by mouth 2 (two) times daily. 07/13/16  Yes Volanda Napoleon, PA-C  glucagon (GLUCAGON EMERGENCY) 1 MG injection Inject 1 mg into the muscle once as needed (For very low blood sugars. May repeat in 15-20 minutes if needed.). 12/20/16  Yes Hongalgi, Lenis Dickinson, MD  insulin aspart (NOVOLOG FLEXPEN) 100 UNIT/ML FlexPen Inject 1-10 Units into the skin 3 (three) times daily with meals. Pt uses 1 unit for every 10 grams of carbs with meals and snacks for BS greater than 50. Sliding scale 1 units/50  when CBG > 150   Yes [provider]  insulin glargine (LANTUS) 100 UNIT/ML injection Inject 0.1 mLs (10 Units total) into the skin daily. 12/20/16  Yes Hongalgi, Lenis Dickinson, MD  lisinopril (PRINIVIL,ZESTRIL) 2.5 MG tablet Take 2.5 mg by mouth daily. 10/08/16  Yes [provider]  metoCLOPramide (REGLAN) 10 MG tablet Take 1 tablet (10 mg total) by mouth every 6 (six) hours. 12/16/16  Yes Etta Quill, NP  metoprolol tartrate (LOPRESSOR) 25 MG tablet Take 0.5 tablets (12.5 mg total) by mouth 2 (two) times daily. 05/10/16  Yes Mariel Aloe, MD  ondansetron (ZOFRAN) 4 MG tablet Take 1 tablet (4 mg total) by mouth every 8 (eight) hours as needed for nausea. 12/20/16  Yes Hongalgi, Lenis Dickinson, MD  oxybutynin (DITROPAN) 5 MG tablet Take 0.5 tablets (2.5 mg total) by mouth 3 (three) times daily. 10/29/16  Yes Donne Hazel, MD  RESTASIS MULTIDOSE 0.05 % ophthalmic emulsion Place 1 drop into both eyes every 12 (twelve) hours. 09/08/16  Yes [provider]  Vitamin D, Ergocalciferol, (DRISDOL) 50000 units CAPS capsule Take 50,000 Units by mouth once a week. On fridays 05/26/16  Yes [provider]    Physical Exam: Vitals:   01/21/17 1945 01/21/17 2000 01/21/17 2015 01/21/17 2045  BP: (!) 151/103 (!) 147/101 (!) 138/106 (!) 143/98  Pulse: (!) 116 (!) 113 (!) 117 (!) 118  Resp: (!) 25 19 (!) 30 (!) 21  Temp:      TempSrc:      SpO2: 100% 100% 100% 100%      Constitutional: Moderately built and nourished. Vitals:   01/21/17 1945 01/21/17 2000 01/21/17 2015 01/21/17 2045  BP: (!) 151/103 (!) 147/101 (!) 138/106 (!) 143/98  Pulse: (!) 116 (!) 113 (!) 117 (!) 118  Resp: (!) 25 19 (!) 30 (!) 21  Temp:      TempSrc:      SpO2: 100% 100% 100% 100%   Eyes: Anicteric no pallor. ENMT: No discharge from the ears eyes nose or mouth. Neck: No mass felt.  No neck rigidity. Respiratory: No rhonchi or crepitations. Cardiovascular: S1-S2 heard no murmurs appreciated. Abdomen:  Soft nontender bowel sounds present. Musculoskeletal: No edema.  No joint effusion. Skin: No rash.  Skin appears warm. Neurologic: Alert awake oriented to time place and person.  Moves all extremities. Psychiatric: Appears normal.  Normal affect.   Labs on Admission: I have personally reviewed following labs and imaging studies  CBC: Recent Labs  Lab 01/21/17 0959  WBC 10.5  HGB 13.9  HCT 43.7  MCV 89.7  PLT 419   Basic Metabolic Panel: Recent Labs  Lab 01/21/17 0959  NA 142  K 4.0  CL 105  CO2 26  GLUCOSE 105*  BUN 15  CREATININE 0.62  CALCIUM 10.0   GFR: CrCl cannot be calculated (Unknown ideal weight.). Liver Function Tests: Recent Labs  Lab 01/21/17 1326  AST 34  ALT 19  ALKPHOS 127*  BILITOT 0.7  PROT 7.7  ALBUMIN 4.1   Recent Labs  Lab 01/21/17 1326  LIPASE 18   No results for input(s): AMMONIA in the last 168 hours. Coagulation Profile: No results for input(s): INR, PROTIME in the last 168 hours. Cardiac Enzymes: No results for input(s): CKTOTAL, CKMB, CKMBINDEX, TROPONINI in the last 168 hours. BNP (last 3 results) No results for input(s): PROBNP in the last 8760 hours. HbA1C: No results for input(s): HGBA1C in the last 72 hours. CBG: Recent Labs  Lab 01/21/17 1003 01/21/17 1224  GLUCAP 107* 144*   Lipid Profile: No results for input(s): CHOL, HDL, LDLCALC, TRIG, CHOLHDL, LDLDIRECT in the last 72 hours. Thyroid Function Tests: No results for input(s): TSH, T4TOTAL, FREET4, T3FREE, THYROIDAB in the last 72 hours. Anemia Panel: No results for input(s): VITAMINB12, FOLATE, FERRITIN, TIBC, IRON, RETICCTPCT in the last 72 hours. Urine analysis:    Component Value Date/Time   COLORURINE STRAW (A) 01/21/2017 2037   APPEARANCEUR HAZY (A) 01/21/2017 2037   LABSPEC 1.021 01/21/2017 2037   PHURINE 7.0 01/21/2017 2037   GLUCOSEU >=500 (A) 01/21/2017 2037   GLUCOSEU >=1000 11/07/2012 1205   HGBUR NEGATIVE 01/21/2017 2037   BILIRUBINUR  NEGATIVE 01/21/2017 2037   KETONESUR 80 (A) 01/21/2017 2037   PROTEINUR 30 (A) 01/21/2017 2037   UROBILINOGEN 0.2 11/24/2014 1045   NITRITE NEGATIVE 01/21/2017 2037   LEUKOCYTESUR SMALL (A) 01/21/2017 2037   Sepsis Labs: @LABRCNTIP (procalcitonin:4,lacticidven:4) )No results found for this or any previous visit (from the past 240 hour(s)).   Radiological Exams on Admission: No results found.   Assessment/Plan Principal Problem:   Intractable nausea and vomiting Active Problems:   Uncontrolled type 1 diabetes mellitus (HCC)   Abdominal pain   Nausea & vomiting    1. Intractable nausea vomiting -suspect likely from gastroparesis or marijuana abuse.  Advised to quit taking marijuana.  We will recheck LFTs.  If nausea vomiting persist may consider further radiological exams.  I have ordered 1 dose of Reglan and may consider further doses based on response.  Full liquid diet for now continue hydration.  If patient has further episodes of diarrhea may consider stool studies.  Check urine drug screen.Recheck lactic acid. 2. Hypertension uncontrolled -I have placed patient on PRN IV hydralazine and continued patient's home dose of metoprolol.  If patient's creatinine does not get worse and lactic acid improves then may keep patient on lisinopril home dose. 3. Possible UTI on ceftriaxone follow urine cultures. 4. Diabetes mellitus type 1 on Lantus insulin which will be continued at this time with sliding scale coverage.  Closely follow CBGs.  Last hemoglobin A1c was around 8.7 in November 2018.  Closely follow metabolic panel to make sure patient is not developing DKA.   DVT prophylaxis: Lovenox. Code Status: Full code. Family Communication: Discussed with patient. Disposition Plan: Home. Consults called: None. Admission status: Observation.   Rise Patience MD [facility] Pager [phone].  If 7PM-7AM, please contact night-coverage [email or web address] Password  [email or web address]  01/21/2017, 11:05 PM

## 2017-01-21 NOTE — ED Provider Notes (Signed)
Patient was transferred to me.  She continues to have vomiting despite multiple treatments including Haldol, morphine, Zofran.  She has been given IV fluids.  With her elevated lactic acid, I am concerned for both dehydration but also possibly infection.  She has too numerous to count white blood cells, cover for UTI with IV Rocephin.  She will be admitted to the hospitalist, Dr. Hal Hope to admit   Sherwood Gambler, MD 01/21/17 2306

## 2017-01-21 NOTE — ED Notes (Signed)
Pt has been seen sticking hand in throat to help vomit

## 2017-01-21 NOTE — ED Provider Notes (Signed)
[place] EMERGENCY DEPARTMENT Provider Note   CSN: [id number] Arrival date & time: 01/21/17  0950     History   Chief Complaint Chief Complaint  Patient presents with  . Loss of Consciousness  . Emesis    HPI [name] is a 27 y.o. female.  HPI Patient reports severe abdominal pain started this morning.  She reports is cramping all over her abdomen with sharp pains as well.  She reports she is felt nauseated and been vomiting.  No fever, no diarrhea.  Patient reports she has been compliant with her insulin.  Reports her blood sugars have been controlled in the 140s.  Patient requests pain medication.  She reports that usually a dose of morphine helps when she is having this problem. Past Medical History:  Diagnosis Date  . Anxiety   . Arthritis    "hands, feet, knees" (12/18/2016)  . Asthma   . Gallstones   . Gastroparesis   . GERD (gastroesophageal reflux disease)   . Heart murmur   . Hepatic steatosis 11/26/2014   and hepatomegaly  . Hypertension    hx (12/18/2016)  . Intractable cyclical vomiting syndrome    Archie Endo 12/18/2016  . Liver mass 11/26/2014  . Pancreatitis, acute 11/26/2014  . Pneumonia    "as a teen X 1" (12/18/2016)  . Type I diabetes mellitus ([place]) 2007   IDDM.  poorly controlled, multiple admits with DKA    Patient Active Problem List   Diagnosis Date Noted  . Intractable cyclical vomiting syndrome 12/18/2016  . Leukocytosis 10/24/2016  . N&V (nausea and vomiting) 10/23/2016  . DKA, type 1 ([place]) 09/26/2016  . Thrombocytosis ([place]) 07/08/2016  . Candiduria, asymptomatic 06/23/2016  . Hyponatremia 06/13/2016  . Hematuria 05/13/2016  . UTI (urinary tract infection) 01/22/2016  . Diarrhea 11/09/2015  . Acute urinary retention   . GERD (gastroesophageal reflux disease) 07/10/2015  . Depression with anxiety 07/10/2015  . Gastroparesis 06/22/2015  . Altered mental status 06/22/2015  . Volume depletion 06/10/2015  .  Protein-calorie malnutrition, severe 06/10/2015  . Hyperglycemia   . Elevated bilirubin   . Hematemesis with nausea   . Intractable nausea and vomiting 05/28/2015  . Abdominal pain in female   . Abdominal pain 05/24/2015  . Hypertension 05/24/2015  . Dehydration   . Chronic diastolic heart failure ([place]) 04/11/2015  . Hematemesis 04/08/2015  . DKA (diabetic ketoacidoses) ([place]) 03/22/2015  . S/P laparoscopic cholecystectomy 02/11/2015  . Postextubation stridor   . Pancreatitis, acute 11/26/2014  . Volume overload 11/26/2014  . Hypokalemia 11/26/2014  . Hepatic steatosis 11/26/2014  . Liver mass 11/26/2014  . Sepsis ([place]) 11/25/2014  . Sinus tachycardia 11/25/2014  . Hypomagnesemia 11/25/2014  . Hypophosphatemia 11/25/2014  . Elevated LFTs 11/24/2014  . AKI (acute kidney injury) ([place]) 11/24/2014  . Migraine headache 11/24/2014  . Asthma 06/29/2012  . Uncontrolled type 1 diabetes mellitus ([place]) 06/19/2010  . Goiter, unspecified 06/19/2010    Past Surgical History:  Procedure Laterality Date  . CHOLECYSTECTOMY N/A 02/11/2015   Procedure: LAPAROSCOPIC CHOLECYSTECTOMY WITH INTRAOPERATIVE CHOLANGIOGRAM;  Surgeon: Greer Pickerel, MD;  Location: [facility];  Service: General;  Laterality: N/A;  . ESOPHAGOGASTRODUODENOSCOPY (EGD) WITH PROPOFOL Left 09/20/2014   Procedure: ESOPHAGOGASTRODUODENOSCOPY (EGD) WITH PROPOFOL;  Surgeon: Arta Silence, MD;  Location: [facility] ENDOSCOPY;  Service: Endoscopy;  Laterality: Left;  . WISDOM TOOTH EXTRACTION      OB History    Gravida Para Term Preterm AB Living   2 1 0 1 1 1  SAB TAB Ectopic Multiple Live Births   0 1 0 0 1       Home Medications    Prior to Admission medications   Medication Sig Start Date End Date Taking? Authorizing Provider  albuterol (PROVENTIL HFA;VENTOLIN HFA) 108 (90 Base) MCG/ACT inhaler Inhale 1-2 puffs into the lungs every 6 (six) hours as needed for wheezing or shortness of breath.    [provider]  dicyclomine  (BENTYL) 20 MG tablet Take 1 tablet (20 mg total) by mouth 2 (two) times daily. 07/13/16   Volanda Napoleon, PA-C  glucagon (GLUCAGON EMERGENCY) 1 MG injection Inject 1 mg into the muscle once as needed (For very low blood sugars. May repeat in 15-20 minutes if needed.). 12/20/16   Hongalgi, Lenis Dickinson, MD  insulin aspart (NOVOLOG FLEXPEN) 100 UNIT/ML FlexPen Inject 1-10 Units into the skin 3 (three) times daily with meals. Pt uses 1 unit for every 10 grams of carbs with meals and snacks for BS greater than 50. Sliding scale 1 units/50 when CBG > 150    [provider]  insulin glargine (LANTUS) 100 UNIT/ML injection Inject 0.1 mLs (10 Units total) into the skin daily. 12/20/16   Hongalgi, Lenis Dickinson, MD  lisinopril (PRINIVIL,ZESTRIL) 2.5 MG tablet Take 2.5 mg by mouth daily. 10/08/16   [provider]  metoCLOPramide (REGLAN) 10 MG tablet Take 1 tablet (10 mg total) by mouth every 6 (six) hours. 12/16/16   Etta Quill, NP  metoprolol tartrate (LOPRESSOR) 25 MG tablet Take 0.5 tablets (12.5 mg total) by mouth 2 (two) times daily. 05/10/16   Mariel Aloe, MD  ondansetron (ZOFRAN) 4 MG tablet Take 1 tablet (4 mg total) by mouth every 8 (eight) hours as needed for nausea. 12/20/16   Hongalgi, Lenis Dickinson, MD  oxybutynin (DITROPAN) 5 MG tablet Take 0.5 tablets (2.5 mg total) by mouth 3 (three) times daily. 10/29/16   Donne Hazel, MD  RESTASIS MULTIDOSE 0.05 % ophthalmic emulsion Place 1 drop into both eyes every 12 (twelve) hours. 09/08/16   [provider]  Vitamin D, Ergocalciferol, (DRISDOL) 50000 units CAPS capsule Take 50,000 Units by mouth once a week. On fridays 05/26/16   [provider]    Family History Family History  Problem Relation Age of Onset  . Heart disease Maternal Grandmother   . Heart disease Maternal Grandfather   . Diabetes Mother   . Hyperlipidemia Mother   . Hypertension Father   . Heart disease Father   . Hypertension Paternal Grandmother   .  Cancer Paternal Grandfather     Social History Social History   Tobacco Use  . Smoking status: Never Smoker  . Smokeless tobacco: Never Used  Substance Use Topics  . Alcohol use: No  . Drug use: Yes    Types: Marijuana    Comment: 12/18/2016 q few days"     Allergies   Peanut-containing drug products; Food; and Ultram [tramadol]   Review of Systems Review of Systems 10 Systems reviewed and are negative for acute change except as noted in the HPI.  Physical Exam Updated Vital Signs BP (!) 147/109   Pulse (!) 102   Temp 98.3 F (36.8 C) (Oral)   Resp (!) 25   LMP 01/12/2017   SpO2 100%   Physical Exam  Constitutional: She is oriented to person, place, and time.  Patient is alert and nontoxic.  She is physically well-developed.  She is writhing about on the stretcher.  She is  rolling from side to side waving her arms in the air and drawing her legs up and extending them.  Patient does not have lower garments on and is exposing herself, I have assisted her to cover up provided reassurance.  HENT:  Head: Normocephalic and atraumatic.  Nose: Nose normal.  Mouth/Throat: Oropharynx is clear and moist.  Eyes: EOM are normal. Pupils are equal, round, and reactive to light.  Neck: Neck supple.  Cardiovascular: Normal rate, regular rhythm, normal heart sounds and intact distal pulses.  Pulmonary/Chest: Effort normal and breath sounds normal.  Abdominal: Soft. Bowel sounds are normal. She exhibits no distension.  No abdominal distention.  No guarding.  Patient endorses tenderness throughout.  Musculoskeletal: Normal range of motion. She exhibits no edema or tenderness.  No peripheral edema.  Patient is moving all extremities in all positions without difficulty.  Neurological: She is alert and oriented to person, place, and time. No cranial nerve deficit. She exhibits normal muscle tone. Coordination normal.  Skin: Skin is warm and dry.  Psychiatric:  Patient is anxious and  hyperventilating.  She is requesting pain medications.     ED Treatments / Results  Labs (all labs ordered are listed, but only abnormal results are displayed) Labs Reviewed  BASIC METABOLIC PANEL - Abnormal; Notable for the following components:      Result Value   Glucose, Bld 105 (*)    All other components within normal limits  CBG MONITORING, ED - Abnormal; Notable for the following components:   Glucose-Capillary 107 (*)    All other components within normal limits  CBG MONITORING, ED - Abnormal; Notable for the following components:   Glucose-Capillary 144 (*)    All other components within normal limits  CBC  URINALYSIS, ROUTINE W REFLEX MICROSCOPIC  HEPATIC FUNCTION PANEL  LIPASE, BLOOD  RAPID URINE DRUG SCREEN, HOSP PERFORMED  I-STAT BETA HCG BLOOD, ED ([facility], [facility], [facility] ONLY)  I-STAT BETA HCG BLOOD, ED ([facility], [facility], [facility] ONLY)  I-STAT CG4 LACTIC ACID, ED  I-STAT VENOUS BLOOD GAS, ED  I-STAT CG4 LACTIC ACID, ED    EKG  EKG Interpretation  Date/Time:  Thursday January 21 2017 13:14:46 EST Ventricular Rate:  97 PR Interval:    QRS Duration: 87 QT Interval:  360 QTC Calculation: 458 R Axis:   74 Text Interpretation:  Sinus rhythm LAE, consider biatrial enlargement Probable left ventricular hypertrophy no changr from previous Confirmed by Charlesetta Shanks [phone]) on 01/21/2017 3:28:19 PM       Radiology No results found.  Procedures Procedures (including critical care time)  Medications Ordered in ED Medications  capsicum (ZOSTRIX) 0.075 % cream (not administered)  sodium chloride 0.9 % bolus 1,000 mL (1,000 mLs Intravenous New Bag/Given 01/21/17 1529)    Followed by  0.9 %  sodium chloride infusion (not administered)  haloperidol lactate (HALDOL) injection 5 mg (5 mg Intravenous Given 01/21/17 1526)     Initial Impression / Assessment and Plan / ED Course  I have reviewed the triage vital signs and the nursing notes.  Pertinent labs & imaging results that were  available during my care of the patient were reviewed by me and considered in my medical decision making (see chart for details).    Recheck 15: 20 patient just had IV established.  She has not yet had fluids and medications.  She remains alert and appropriate.  She is sitting at the end of the stretcher.  She has dry heaves.  Mental status clear.  No respiratory  distress.  Patient reviewed and signed out to Dr. Regenia Skeeter.  He will reevaluate the patient after treatment.  Plan will be to treat symptoms with anticipated discharge once symptomatically controlled.  Final Clinical Impressions(s) / ED Diagnoses   Final diagnoses:  Gastroparesis  Cyclical vomiting with nausea, intractability of vomiting not specified    ED Discharge Orders    None       Charlesetta Shanks, MD 01/21/17 1539

## 2017-01-21 NOTE — ED Notes (Addendum)
Pt witnessed (by this Tech) sticking fingers down her throat.

## 2017-01-22 ENCOUNTER — Encounter (HOSPITAL_COMMUNITY): Payer: Self-pay | Admitting: Internal Medicine

## 2017-01-22 ENCOUNTER — Other Ambulatory Visit: Payer: Self-pay

## 2017-01-22 DIAGNOSIS — E1043 Type 1 diabetes mellitus with diabetic autonomic (poly)neuropathy: Secondary | ICD-10-CM | POA: Diagnosis not present

## 2017-01-22 DIAGNOSIS — F1210 Cannabis abuse, uncomplicated: Secondary | ICD-10-CM | POA: Diagnosis present

## 2017-01-22 DIAGNOSIS — K3184 Gastroparesis: Secondary | ICD-10-CM

## 2017-01-22 DIAGNOSIS — N179 Acute kidney failure, unspecified: Secondary | ICD-10-CM | POA: Diagnosis present

## 2017-01-22 DIAGNOSIS — G43A0 Cyclical vomiting, not intractable: Secondary | ICD-10-CM | POA: Diagnosis not present

## 2017-01-22 DIAGNOSIS — R338 Other retention of urine: Secondary | ICD-10-CM | POA: Diagnosis not present

## 2017-01-22 DIAGNOSIS — E1065 Type 1 diabetes mellitus with hyperglycemia: Secondary | ICD-10-CM | POA: Diagnosis not present

## 2017-01-22 DIAGNOSIS — E1143 Type 2 diabetes mellitus with diabetic autonomic (poly)neuropathy: Secondary | ICD-10-CM

## 2017-01-22 DIAGNOSIS — R1013 Epigastric pain: Secondary | ICD-10-CM | POA: Diagnosis not present

## 2017-01-22 DIAGNOSIS — E1010 Type 1 diabetes mellitus with ketoacidosis without coma: Secondary | ICD-10-CM | POA: Diagnosis present

## 2017-01-22 DIAGNOSIS — R112 Nausea with vomiting, unspecified: Secondary | ICD-10-CM | POA: Diagnosis present

## 2017-01-22 DIAGNOSIS — J45909 Unspecified asthma, uncomplicated: Secondary | ICD-10-CM | POA: Diagnosis present

## 2017-01-22 DIAGNOSIS — F419 Anxiety disorder, unspecified: Secondary | ICD-10-CM | POA: Diagnosis present

## 2017-01-22 DIAGNOSIS — Z79899 Other long term (current) drug therapy: Secondary | ICD-10-CM | POA: Diagnosis not present

## 2017-01-22 DIAGNOSIS — E860 Dehydration: Secondary | ICD-10-CM | POA: Diagnosis present

## 2017-01-22 DIAGNOSIS — I10 Essential (primary) hypertension: Secondary | ICD-10-CM | POA: Diagnosis present

## 2017-01-22 DIAGNOSIS — K219 Gastro-esophageal reflux disease without esophagitis: Secondary | ICD-10-CM | POA: Diagnosis present

## 2017-01-22 LAB — HEPATIC FUNCTION PANEL
ALBUMIN: 3.3 g/dL — AB (ref 3.5–5.0)
ALT: 15 U/L (ref 14–54)
AST: 21 U/L (ref 15–41)
Alkaline Phosphatase: 109 U/L (ref 38–126)
Total Bilirubin: 0.8 mg/dL (ref 0.3–1.2)
Total Protein: 6.6 g/dL (ref 6.5–8.1)

## 2017-01-22 LAB — GLUCOSE, CAPILLARY
GLUCOSE-CAPILLARY: 173 mg/dL — AB (ref 65–99)
Glucose-Capillary: 177 mg/dL — ABNORMAL HIGH (ref 65–99)

## 2017-01-22 LAB — BASIC METABOLIC PANEL
Anion gap: 11 (ref 5–15)
BUN: 9 mg/dL (ref 6–20)
CO2: 19 mmol/L — ABNORMAL LOW (ref 22–32)
CREATININE: 0.54 mg/dL (ref 0.44–1.00)
Calcium: 7.6 mg/dL — ABNORMAL LOW (ref 8.9–10.3)
Chloride: 108 mmol/L (ref 101–111)
GFR calc Af Amer: >60 mL/min (ref >60)
GLUCOSE: 246 mg/dL — AB (ref 65–99)
Potassium: 3.7 mmol/L (ref 3.5–5.1)
Sodium: 138 mmol/L (ref 135–145)

## 2017-01-22 LAB — CBG MONITORING, ED
GLUCOSE-CAPILLARY: 191 mg/dL — AB (ref 65–99)
Glucose-Capillary: 221 mg/dL — ABNORMAL HIGH (ref 65–99)

## 2017-01-22 LAB — CBC
HEMATOCRIT: 36.5 % (ref 36.0–46.0)
Hemoglobin: 11.5 g/dL — ABNORMAL LOW (ref 12.0–15.0)
MCH: 27.8 pg (ref 26.0–34.0)
MCHC: 31.5 g/dL (ref 30.0–36.0)
MCV: 88.2 fL (ref 78.0–100.0)
PLATELETS: 428 10*3/uL — AB (ref 150–400)
RBC: 4.14 MIL/uL (ref 3.87–5.11)
RDW: 14.5 % (ref 11.5–15.5)
WBC: 14.5 10*3/uL — ABNORMAL HIGH (ref 4.0–10.5)

## 2017-01-22 LAB — HEMOGLOBIN A1C
HEMOGLOBIN A1C: 9.3 % — AB (ref 4.8–5.6)
Mean Plasma Glucose: 220.21 mg/dL

## 2017-01-22 LAB — LACTIC ACID, PLASMA: LACTIC ACID, VENOUS: 1.4 mmol/L (ref 0.5–1.9)

## 2017-01-22 MED ORDER — DEXTROSE 5 % IV SOLN: 1.0000 g | INTRAVENOUS | Status: DC

## 2017-01-22 MED ORDER — MORPHINE SULFATE (PF) 2 MG/ML IV SOLN
2.0000 mg | Freq: Once | INTRAVENOUS | Status: AC | PRN
  Administered 2017-01-22: 2 mg via INTRAVENOUS
  Filled 2017-01-22: qty 1

## 2017-01-22 MED ORDER — HYDRALAZINE HCL 20 MG/ML IJ SOLN: 10.0000 mg | INTRAMUSCULAR | Status: DC | PRN

## 2017-01-22 MED ORDER — PROMETHAZINE HCL 25 MG/ML IJ SOLN
12.5000 mg | Freq: Once | INTRAMUSCULAR | Status: AC
  Administered 2017-01-22: 11:00:00 via INTRAVENOUS
  Filled 2017-01-22: qty 1

## 2017-01-22 MED ORDER — METOCLOPRAMIDE HCL 5 MG/ML IJ SOLN
5.0000 mg | Freq: Four times a day (QID) | INTRAMUSCULAR | Status: DC
  Administered 2017-01-22 – 2017-01-23 (×5): 5 mg via INTRAVENOUS
  Filled 2017-01-22 (×5): qty 2

## 2017-01-22 MED ORDER — HYOSCYAMINE SULFATE 0.5 MG/ML IJ SOLN
0.2500 mg | Freq: Four times a day (QID) | INTRAMUSCULAR | Status: DC | PRN
  Administered 2017-01-22 – 2017-01-24 (×6): 0.25 mg via INTRAVENOUS
  Filled 2017-01-22 (×10): qty 0.5

## 2017-01-22 NOTE — ED Notes (Signed)
Paged Dr. Rockne Menghini to RN Caryl Pina per her request

## 2017-01-22 NOTE — Progress Notes (Addendum)
Progress Note    [name]  KDX:[id number] DOB: 1990-07-15  DOA: 01/21/2017 PCP: Vicenta Aly, FNP    Brief Narrative:   Chief complaint: F/U nausea and vomiting.  Medical records reviewed and are as summarized below:  [name] is an 27 y.o. female with a PMH of diabetes mellitus type 1 with possible diabetic gastroparesis and hypertension who was admitted 01/21/17 with a chief complaint of a 1 day history of nausea, vomiting and 2 episodes of diarrhea associated with crampy abdominal pain. She also has a h/o known marijuana abuse and admissions to the hospital in the past with complaints of similar symptoms. Her last marijuana abuse was 2 days prior to admission. On admission, the patient's vital signs were unstable with hypertension, tachycardia and significant tachypnea.  Assessment/Plan:   Principal Problem:   Intractable nausea and vomiting/abdominal pain associated with unstable vital signs and lactic acidosis in a patient with known gastroparesis Differential includes an exacerbation of diabetic gastroparesis, viral gastroenteritis versus marijuana associated hyperemesis syndrome. She was placed on a FL diet and given Reglan by admitting MD. VS this morning show improvement of tachycardia, BP and tachypnea. WBC now elevated to 14.5. Lactate has cleared with IVF. The patient had a gastric emptying study done 07/09/16 and was noted to have significantly delayed gastric emptying. Start empiric Reglan 5 mg IV Q 6 hours. Continue anti-emetics. Dietician consulted to provide diet education re: small, frequent meals. Avoid narcotics which can slow bowel motility. Can use bentyl/levsin for cramping.  Active Problems:   Marijuana abuse UDS + marijuana and opiates.    Possible UTI Placed on empiric Rocephin. U/A showed TNTC WBCs but negative nitrite. Unfortunately a urine culture was not sent.    Uncontrolled type 1 diabetes mellitus (HCC) Last hemoglobin A1c was  around 8.7 in November 2018. Repeat. Anion gap this morning is 11. No evidence of DKA at this time. Continue Lantus and insulin sensitive SSI. CBGs 107-191. Need to optimize glycemic control to improve gastric emptying.  Avoid hyperglycemia. Will ask DM coordinator to assess & provide teaching re: importance of glycemic control.    Hypertension BP improved this morning. Continue metoprolol and PRN hydralazine.   Family Communication/Anticipated D/C date and plan/Code Status   DVT prophylaxis: Lovenox ordered. Code Status: Full Code.  Family Communication: No family at the bedside. Disposition Plan: Home when N/V improved and diet advanced, hopefully 01/23/17.   Medical Consultants:    None.   Anti-Infectives:    Rocephin 01/21/17--->  Subjective:   Complains of constant abdominal pain and ongoing nausea.  Says she did not eat anything this morning due to the pain, and requests morphine and something for nausea. Able to keep liquids down OK.  Objective:    Vitals:   01/22/17 0600 01/22/17 0630 01/22/17 0700 01/22/17 0730  BP: 123/84 116/76 115/87 116/82  Pulse: (!) 105 (!) 103 97 100  Resp: 19 16 15 18   Temp:      TempSrc:      SpO2: 99% 99% 100% 100%    Intake/Output Summary (Last 24 hours) at 01/22/2017 0814 Last data filed at 01/22/2017 0130 Gross per 24 hour  Intake 3050 ml  Output 200 ml  Net 2850 ml   There were no vitals filed for this visit.  Exam: General: Writhing.  Cardiovascular: Heart sounds show a regular rate but mildly tachy, and regular rhythm. No gallops or rubs. No murmurs. No JVD. Lungs: Clear to auscultation bilaterally with good air movement.  No rales, rhonchi or wheezes. Abdomen: Soft, nontender, nondistended with normal active bowel sounds. No masses. No hepatosplenomegaly. Skin: Warm and dry. No rashes or lesions. Extremities: No clubbing or cyanosis. No edema. Pedal pulses 2+.  Data Reviewed:   I have personally reviewed following labs and  imaging studies:  Labs: Labs show the following:   Basic Metabolic Panel: Recent Labs  Lab 01/21/17 0959 01/22/17 0244  NA 142 138  K 4.0 3.7  CL 105 108  CO2 26 19*  GLUCOSE 105* 246*  BUN 15 9  CREATININE 0.62 0.54  CALCIUM 10.0 7.6*   GFR CrCl cannot be calculated (Unknown ideal weight.). Liver Function Tests: Recent Labs  Lab 01/21/17 1326 01/22/17 0244  AST 34 21  ALT 19 15  ALKPHOS 127* 109  BILITOT 0.7 0.8  PROT 7.7 6.6  ALBUMIN 4.1 3.3*   Recent Labs  Lab 01/21/17 1326  LIPASE 18   CBC: Recent Labs  Lab 01/21/17 0959 01/22/17 0244  WBC 10.5 14.5*  HGB 13.9 11.5*  HCT 43.7 36.5  MCV 89.7 88.2  PLT 384 428*   CBG: Recent Labs  Lab 01/21/17 1003 01/21/17 1224 01/22/17 0723  GLUCAP 107* 144* 191*   Sepsis Labs: Recent Labs  Lab 01/21/17 0959 01/21/17 1703 01/21/17 2132 01/22/17 0244 01/22/17 0649  WBC 10.5  --   --  14.5*  --   LATICACIDVEN  --  3.04* 2.16*  --  1.4    Microbiology No results found for this or any previous visit (from the past 240 hour(s)).  Procedures and diagnostic studies:  No results found.  Medications:   . dicyclomine  20 mg Oral BID  . enoxaparin (LOVENOX) injection  40 mg Subcutaneous Daily  . insulin aspart  0-9 Units Subcutaneous TID WC  . insulin glargine  10 Units Subcutaneous Daily  . metoprolol tartrate  12.5 mg Oral BID  . oxybutynin  2.5 mg Oral TID   Continuous Infusions: . sodium chloride 1,000 mL (01/21/17 2337)  . [START ON 01/23/2017] cefTRIAXone (ROCEPHIN)  IV       LOS: 0 days   Jacquelynn Cree  [facility] Pager [phone]. If unable to reach me by pager, please call my cell phone at [phone].  *Please refer to [email or web address], password [email or web address] to get updated schedule on who will round on this patient, as hospitalists switch teams weekly. If 7PM-7AM, please contact night-coverage at [email or web address], password [email or web address] for any overnight needs.  01/22/2017, 8:14 AM

## 2017-01-22 NOTE — Progress Notes (Signed)
Pharmacy Antibiotic Note  [name] is a 27 y.o. female admitted on 01/21/2017 with UTI.  Pharmacy has been consulted for Cerftriaxone dosing. WBC WNL. Lactic acid elevated. Abnormal U/A.   Plan: Ceftriaxone 1g IV q24h F/U urine culture for directed therapy  Temp (24hrs), Avg:98.3 F (36.8 C), Min:98.3 F (36.8 C), Max:98.3 F (36.8 C)  Recent Labs  Lab 01/21/17 0959 01/21/17 1703 01/21/17 2132  WBC 10.5  --   --   CREATININE 0.62  --   --   LATICACIDVEN  --  3.04* 2.16*    CrCl cannot be calculated (Unknown ideal weight.).    Allergies  Allergen Reactions  . Peanut-Containing Drug Products Swelling and Other (See Comments)    Reaction:  Swelling of mouth and lips   . 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   Reaction:  Swelling of mouth and lips   . Ultram [Tramadol] Itching      Narda Bonds 01/22/2017 12:10 AM

## 2017-01-22 NOTE — ED Notes (Signed)
CBG 191. RN notified. 

## 2017-01-22 NOTE — Progress Notes (Signed)
Patient is very well known to inpatient diabetes team and has been seen by diabetes coordinators multiple times. Last seen by DM Coordinator Crissie Figures 11/09/16. Will follow.  Thank you, Nani Gasser. Tylesha Gibeault, RN, MSN, CDE  Diabetes Coordinator Inpatient Glycemic Control Team Team Pager [phone] (8am-5pm) 01/22/2017 1:25 PM

## 2017-01-22 NOTE — Progress Notes (Signed)
Nutrition Consult/Brief Note  RD consulted for diet education regarding gastroparesis. Pt sleeping with blanket over head upon RD visit. Name called x 2 with no response. Left "Gastroparesis Nutrition Therapy" handout from [employer] on tray table. RD to follow up at later date.  Arthur Holms, RD, LDN Pager #: [phone] After-Hours Pager #: [phone]

## 2017-01-23 LAB — GLUCOSE, CAPILLARY
GLUCOSE-CAPILLARY: 324 mg/dL — AB (ref 65–99)
GLUCOSE-CAPILLARY: 245 mg/dL — AB (ref 65–99)
GLUCOSE-CAPILLARY: 197 mg/dL — AB (ref 65–99)
Glucose-Capillary: 291 mg/dL — ABNORMAL HIGH (ref 65–99)

## 2017-01-23 MED ORDER — PROMETHAZINE HCL 25 MG/ML IJ SOLN
12.5000 mg | Freq: Four times a day (QID) | INTRAMUSCULAR | Status: DC | PRN
  Administered 2017-01-23 – 2017-01-27 (×12): 12.5 mg via INTRAVENOUS
  Filled 2017-01-23 (×12): qty 1

## 2017-01-23 MED ORDER — TRAMADOL HCL 50 MG PO TABS
50.0000 mg | ORAL_TABLET | Freq: Four times a day (QID) | ORAL | Status: DC | PRN
  Administered 2017-01-23 – 2017-01-27 (×10): 50 mg via ORAL
  Filled 2017-01-23 (×10): qty 1

## 2017-01-23 MED ORDER — LISINOPRIL 5 MG PO TABS
2.5000 mg | ORAL_TABLET | Freq: Every day | ORAL | Status: DC
  Administered 2017-01-23: 2.5 mg via ORAL
  Filled 2017-01-23: qty 1

## 2017-01-23 MED ORDER — METOCLOPRAMIDE HCL 5 MG/ML IJ SOLN
10.0000 mg | Freq: Four times a day (QID) | INTRAMUSCULAR | Status: DC
  Administered 2017-01-23 – 2017-01-25 (×8): 10 mg via INTRAVENOUS
  Filled 2017-01-23 (×8): qty 2

## 2017-01-23 MED ORDER — SODIUM CHLORIDE 0.9 % IV SOLN
INTRAVENOUS | Status: DC
  Administered 2017-01-23: 14:00:00 via INTRAVENOUS

## 2017-01-23 MED ORDER — KETOROLAC TROMETHAMINE 15 MG/ML IJ SOLN
15.0000 mg | Freq: Four times a day (QID) | INTRAMUSCULAR | Status: DC | PRN
  Administered 2017-01-23 – 2017-01-24 (×3): 15 mg via INTRAVENOUS
  Filled 2017-01-23 (×3): qty 1

## 2017-01-23 NOTE — Plan of Care (Signed)
Pt still complaining of pain and vomiting

## 2017-01-23 NOTE — Progress Notes (Signed)
PROGRESS NOTE    [name]  EUM:[id number] DOB: 25-Sep-1990 DOA: 01/21/2017 PCP: Vicenta Aly, FNP     Brief Narrative:  [name] is a 27 yo female with past medical history of type 1 diabetes mellitus with diabetic gastroparesis and hypertension who to the hospital with 1 day history of nausea, vomiting, diarrhea associated with crampy abdominal pain.  She was admitted for intractable nausea and vomiting secondary to gastroparesis.  Assessment & Plan:   Principal Problem:   Intractable nausea and vomiting Active Problems:   Uncontrolled type 1 diabetes mellitus (HCC)   Diabetic gastroparesis (HCC)   Abdominal pain   Hypertension   Nausea & vomiting   Nausea and vomiting   Intractable nausea and vomiting secondary to diabetic gastroparesis -Patient has had previous gastric emptying study done in July 09, 2016 and was noted to have significantly delayed gastric emptying. -Started on IV Reglan -Continue antiemetic -Limit narcotics in diabetic gastroparesis  -IVF   Asymptomatic pyuria  -UA with small leukocytes, WBC. No bacteria seen. Urine culture was not sent.  -Elevated WBC and lactic acid likely due to dehydration in setting of above -Stop Rocephin  Uncontrolled type 1 diabetes mellitus, hyperglycemia -Ha1c 9.3  -Continue lantus, SS novolog   Essential hypertension -Continue lopressor, resume lisinopril     DVT prophylaxis: lovenox Code Status: full Family Communication: no family at bedside Disposition Plan: pending improvement   Consultants:   none  Procedures:   none  Antimicrobials:  Anti-infectives (From admission, onward)   Start     Dose/Rate Route Frequency Ordered Stop   01/23/17 2200  cefTRIAXone (ROCEPHIN) 1 g in dextrose 5 % 50 mL IVPB  Status:  Discontinued     1 g 100 mL/hr over 30 Minutes Intravenous Every 24 hours 01/22/17 0012 01/23/17 1311   01/21/17 2145  cefTRIAXone (ROCEPHIN) 1 g in dextrose 5 % 50 mL IVPB     1  g 100 mL/hr over 30 Minutes Intravenous  Once 01/21/17 2140 01/22/17 0130       Subjective: Complains of abdominal pain, nausea. Last vomiting this afternoon around 11-12pm   Objective: Vitals:   01/22/17 1457 01/22/17 2148 01/23/17 0450 01/23/17 0853  BP: (!) 146/108 (!) 145/94 (!) 144/102 (!) 145/97  Pulse: (!) 108 (!) 120 (!) 119 (!) 120  Resp: 20 16 20    Temp: 99.2 F (37.3 C) 98.9 F (37.2 C) 98.6 F (37 C)   TempSrc: Oral Oral Oral   SpO2: 100% 100% 100%   Weight: 53 kg (116 lb 13.5 oz)     Height: 5\' 4"  (1.626 m)       Intake/Output Summary (Last 24 hours) at 01/23/2017 1314 Last data filed at 01/23/2017 1125 Gross per 24 hour  Intake 2362.92 ml  Output 500 ml  Net 1862.92 ml   Filed Weights   01/22/17 1457  Weight: 53 kg (116 lb 13.5 oz)    Examination:  General exam: Appears calm Respiratory system: Clear to auscultation. Respiratory effort normal. Cardiovascular system: S1 & S2 heard, tachycardic, regular rhythm. No JVD, murmurs, rubs, gallops or clicks. No pedal edema. Gastrointestinal system: Abdomen is nondistended, soft and nontender. No organomegaly or masses felt. Normal bowel sounds heard. Central nervous system: Alert and oriented. No focal neurological deficits. Extremities: Symmetric 5 x 5 power. Skin: No rashes, lesions or ulcers Psychiatry: Judgement and insight appear normal. Mood & affect appropriate.   Data Reviewed: I have personally reviewed following labs and imaging studies  CBC: Recent Labs  Lab 01/21/17 0959 01/22/17 0244  WBC 10.5 14.5*  HGB 13.9 11.5*  HCT 43.7 36.5  MCV 89.7 88.2  PLT 384 161*   Basic Metabolic Panel: Recent Labs  Lab 01/21/17 0959 01/22/17 0244  NA 142 138  K 4.0 3.7  CL 105 108  CO2 26 19*  GLUCOSE 105* 246*  BUN 15 9  CREATININE 0.62 0.54  CALCIUM 10.0 7.6*   GFR: Estimated Creatinine Clearance: 88.4 mL/min (by C-G formula based on SCr of 0.54 mg/dL). Liver Function Tests: Recent Labs  Lab  01/21/17 1326 01/22/17 0244  AST 34 21  ALT 19 15  ALKPHOS 127* 109  BILITOT 0.7 0.8  PROT 7.7 6.6  ALBUMIN 4.1 3.3*   Recent Labs  Lab 01/21/17 1326  LIPASE 18   No results for input(s): AMMONIA in the last 168 hours. Coagulation Profile: No results for input(s): INR, PROTIME in the last 168 hours. Cardiac Enzymes: No results for input(s): CKTOTAL, CKMB, CKMBINDEX, TROPONINI in the last 168 hours. BNP (last 3 results) No results for input(s): PROBNP in the last 8760 hours. HbA1C: Recent Labs    01/22/17 0244  HGBA1C 9.3*   CBG: Recent Labs  Lab 01/22/17 1304 01/22/17 1632 01/22/17 2212 01/23/17 0759 01/23/17 1200  GLUCAP 221* 173* 177* 324* 245*   Lipid Profile: No results for input(s): CHOL, HDL, LDLCALC, TRIG, CHOLHDL, LDLDIRECT in the last 72 hours. Thyroid Function Tests: No results for input(s): TSH, T4TOTAL, FREET4, T3FREE, THYROIDAB in the last 72 hours. Anemia Panel: No results for input(s): VITAMINB12, FOLATE, FERRITIN, TIBC, IRON, RETICCTPCT in the last 72 hours. Sepsis Labs: Recent Labs  Lab 01/21/17 1703 01/21/17 2132 01/22/17 0649  LATICACIDVEN 3.04* 2.16* 1.4    No results found for this or any previous visit (from the past 240 hour(s)).     Radiology Studies: No results found.    Scheduled Meds: . dicyclomine  20 mg Oral BID  . enoxaparin (LOVENOX) injection  40 mg Subcutaneous Daily  . insulin aspart  0-9 Units Subcutaneous TID WC  . insulin glargine  10 Units Subcutaneous Daily  . lisinopril  2.5 mg Oral Daily  . metoCLOPramide (REGLAN) injection  10 mg Intravenous Q6H  . metoprolol tartrate  12.5 mg Oral BID  . oxybutynin  2.5 mg Oral TID   Continuous Infusions:    LOS: 1 day    Time spent: 40 minutes   Dessa Phi, DO [facility] [email or web address] Password [email or web address] 01/23/2017, 1:14 PM

## 2017-01-24 ENCOUNTER — Inpatient Hospital Stay (HOSPITAL_COMMUNITY): Payer: Medicaid Other

## 2017-01-24 LAB — GLUCOSE, CAPILLARY
GLUCOSE-CAPILLARY: 362 mg/dL — AB (ref 65–99)
GLUCOSE-CAPILLARY: 308 mg/dL — AB (ref 65–99)
Glucose-Capillary: 391 mg/dL — ABNORMAL HIGH (ref 65–99)
Glucose-Capillary: 294 mg/dL — ABNORMAL HIGH (ref 65–99)
Glucose-Capillary: 298 mg/dL — ABNORMAL HIGH (ref 65–99)

## 2017-01-24 LAB — BASIC METABOLIC PANEL WITH GFR
Anion gap: 18 — ABNORMAL HIGH (ref 5–15)
BUN: 16 mg/dL (ref 6–20)
CO2: 7 mmol/L — ABNORMAL LOW (ref 22–32)
Calcium: 8.8 mg/dL — ABNORMAL LOW (ref 8.9–10.3)
Chloride: 108 mmol/L (ref 101–111)
Creatinine, Ser: 1.2 mg/dL — ABNORMAL HIGH (ref 0.44–1.00)
GFR calc Af Amer: >60 mL/min
GFR calc non Af Amer: >60 mL/min
Glucose, Bld: 363 mg/dL — ABNORMAL HIGH (ref 65–99)
Potassium: 4.7 mmol/L (ref 3.5–5.1)
Sodium: 133 mmol/L — ABNORMAL LOW (ref 135–145)

## 2017-01-24 LAB — CBC
HCT: 44.7 % (ref 36.0–46.0)
Hemoglobin: 14.4 g/dL (ref 12.0–15.0)
MCH: 29 pg (ref 26.0–34.0)
MCHC: 32.2 g/dL (ref 30.0–36.0)
MCV: 90.1 fL (ref 78.0–100.0)
Platelets: 282 K/uL (ref 150–400)
RBC: 4.96 MIL/uL (ref 3.87–5.11)
RDW: 15 % (ref 11.5–15.5)
WBC: 9.6 K/uL (ref 4.0–10.5)

## 2017-01-24 MED ORDER — INSULIN ASPART 100 UNIT/ML ~~LOC~~ SOLN
7.0000 [IU] | Freq: Once | SUBCUTANEOUS | Status: AC
  Administered 2017-01-24: 7 [IU] via SUBCUTANEOUS

## 2017-01-24 MED ORDER — INSULIN GLARGINE 100 UNIT/ML ~~LOC~~ SOLN
15.0000 [IU] | Freq: Every day | SUBCUTANEOUS | Status: DC
  Administered 2017-01-24: 15 [IU] via SUBCUTANEOUS
  Filled 2017-01-24 (×2): qty 0.15

## 2017-01-24 MED ORDER — INSULIN ASPART 100 UNIT/ML ~~LOC~~ SOLN
3.0000 [IU] | Freq: Three times a day (TID) | SUBCUTANEOUS | Status: DC
  Administered 2017-01-25 – 2017-01-26 (×3): 3 [IU] via SUBCUTANEOUS

## 2017-01-24 MED ORDER — SODIUM CHLORIDE 0.9 % IV SOLN
INTRAVENOUS | Status: DC
Start: 2017-01-24 — End: 2017-01-25
  Administered 2017-01-25: 04:00:00 via INTRAVENOUS

## 2017-01-24 NOTE — Progress Notes (Signed)
PROGRESS NOTE    [name]  LTJ:[id number] DOB: Jul 14, 1990 DOA: 01/21/2017 PCP: Vicenta Aly, FNP     Brief Narrative:  [name] is a 27 yo female with past medical history of type 1 diabetes mellitus with diabetic gastroparesis and hypertension who to the hospital with 1 day history of nausea, vomiting, diarrhea associated with crampy abdominal pain.  She was admitted for intractable nausea and vomiting secondary to gastroparesis.  Assessment & Plan:   Principal Problem:   Intractable nausea and vomiting Active Problems:   Uncontrolled type 1 diabetes mellitus (HCC)   Diabetic gastroparesis (HCC)   Abdominal pain   Hypertension   Nausea & vomiting   Nausea and vomiting   Intractable nausea and vomiting secondary to diabetic gastroparesis -Patient has had previous gastric emptying study done in July 09, 2016 and was noted to have significantly delayed gastric emptying. -Started on IV Reglan -Continue antiemetic -Limit narcotics in diabetic gastroparesis  -Advance to carb modified diet today   AKI -In setting of urinary retention, bladder scan > 980mL -Repeat BMP in AM   Asymptomatic pyuria  -UA with small leukocytes, WBC. No bacteria seen. Urine culture was not sent.  -Elevated WBC and lactic acid likely due to dehydration in setting of above -Stop Rocephin  Uncontrolled type 1 diabetes mellitus, hyperglycemia -Ha1c 9.3  -Continue lantus, SS novolog   Essential hypertension -Continue lopressor, hold lisinopril in AKI     DVT prophylaxis: lovenox Code Status: full Family Communication: no family at bedside Disposition Plan: pending improvement   Consultants:   none  Procedures:   none  Antimicrobials:  Anti-infectives (From admission, onward)   Start     Dose/Rate Route Frequency Ordered Stop   01/23/17 2200  cefTRIAXone (ROCEPHIN) 1 g in dextrose 5 % 50 mL IVPB  Status:  Discontinued     1 g 100 mL/hr over 30 Minutes Intravenous  Every 24 hours 01/22/17 0012 01/23/17 1311   01/21/17 2145  cefTRIAXone (ROCEPHIN) 1 g in dextrose 5 % 50 mL IVPB     1 g 100 mL/hr over 30 Minutes Intravenous  Once 01/21/17 2140 01/22/17 0130       Subjective: Last vomiting last night, none today. Wants to advance diet. Continues to have nausea. Had abdominal distention this morning, resolves after in and out cath   Objective: Vitals:   01/23/17 2136 01/23/17 2136 01/24/17 0502 01/24/17 0822  BP: (!) 141/96 (!) 141/96 (!) 143/97 (!) 108/59  Pulse: (!) 141 (!) 141 (!) 116 (!) 112  Resp: 18 18 20    Temp: 98.9 F (37.2 C) 98.9 F (37.2 C) 98.4 F (36.9 C)   TempSrc: Oral Oral Oral   SpO2: 100% 100% 100%   Weight:      Height:        Intake/Output Summary (Last 24 hours) at 01/24/2017 1145 Last data filed at 01/24/2017 1010 Gross per 24 hour  Intake 1545 ml  Output 1000 ml  Net 545 ml   Filed Weights   01/22/17 1457  Weight: 53 kg (116 lb 13.5 oz)    Examination:  General exam: Appears calm Respiratory system: Clear to auscultation. Respiratory effort normal. Cardiovascular system: S1 & S2 heard, tachycardic, regular rhythm. No JVD, murmurs, rubs, gallops or clicks. No pedal edema. Gastrointestinal system: Abdomen is nondistended, soft and nontender. No organomegaly or masses felt. Normal bowel sounds heard. Central nervous system: Alert and oriented. No focal neurological deficits. Extremities: Symmetric 5 x 5 power. Skin: No rashes, lesions  or ulcers Psychiatry: Judgement and insight appear normal. Mood & affect appropriate.   Data Reviewed: I have personally reviewed following labs and imaging studies  CBC: Recent Labs  Lab 01/21/17 0959 01/22/17 0244 01/24/17 0228  WBC 10.5 14.5* 9.6  HGB 13.9 11.5* 14.4  HCT 43.7 36.5 44.7  MCV 89.7 88.2 90.1  PLT 384 428* 099   Basic Metabolic Panel: Recent Labs  Lab 01/21/17 0959 01/22/17 0244 01/24/17 0228  NA 142 138 133*  K 4.0 3.7 4.7  CL 105 108 108  CO2  26 19* 7*  GLUCOSE 105* 246* 363*  BUN 15 9 16   CREATININE 0.62 0.54 1.20*  CALCIUM 10.0 7.6* 8.8*   GFR: Estimated Creatinine Clearance: 58.9 mL/min (A) (by C-G formula based on SCr of 1.2 mg/dL (H)). Liver Function Tests: Recent Labs  Lab 01/21/17 1326 01/22/17 0244  AST 34 21  ALT 19 15  ALKPHOS 127* 109  BILITOT 0.7 0.8  PROT 7.7 6.6  ALBUMIN 4.1 3.3*   Recent Labs  Lab 01/21/17 1326  LIPASE 18   No results for input(s): AMMONIA in the last 168 hours. Coagulation Profile: No results for input(s): INR, PROTIME in the last 168 hours. Cardiac Enzymes: No results for input(s): CKTOTAL, CKMB, CKMBINDEX, TROPONINI in the last 168 hours. BNP (last 3 results) No results for input(s): PROBNP in the last 8760 hours. HbA1C: Recent Labs    01/22/17 0244  HGBA1C 9.3*   CBG: Recent Labs  Lab 01/23/17 1200 01/23/17 1644 01/23/17 2138 01/24/17 0552 01/24/17 0812  GLUCAP 245* 291* 197* 362* 391*   Lipid Profile: No results for input(s): CHOL, HDL, LDLCALC, TRIG, CHOLHDL, LDLDIRECT in the last 72 hours. Thyroid Function Tests: No results for input(s): TSH, T4TOTAL, FREET4, T3FREE, THYROIDAB in the last 72 hours. Anemia Panel: No results for input(s): VITAMINB12, FOLATE, FERRITIN, TIBC, IRON, RETICCTPCT in the last 72 hours. Sepsis Labs: Recent Labs  Lab 01/21/17 1703 01/21/17 2132 01/22/17 0649  LATICACIDVEN 3.04* 2.16* 1.4    No results found for this or any previous visit (from the past 240 hour(s)).     Radiology Studies: No results found.    Scheduled Meds: . dicyclomine  20 mg Oral BID  . enoxaparin (LOVENOX) injection  40 mg Subcutaneous Daily  . insulin aspart  0-9 Units Subcutaneous TID WC  . insulin glargine  15 Units Subcutaneous Daily  . metoCLOPramide (REGLAN) injection  10 mg Intravenous Q6H  . metoprolol tartrate  12.5 mg Oral BID  . oxybutynin  2.5 mg Oral TID   Continuous Infusions:    LOS: 2 days    Time spent: 20 minutes    Dessa Phi, DO [facility] [email or web address] Password [email or web address] 01/24/2017, 11:45 AM

## 2017-01-24 NOTE — Plan of Care (Signed)
  Progressing Elimination: Will not experience complications related to urinary retention Complained of "not emptying bladder" overnight, pt voided small amount, bladder scan showed 257ml retention. Encouraged to continue trying to void. 0600- pt felt better bladder scan remained 200 but pt admitted to voiding larger amt.  01/24/2017 4627 - Progressing by Verne Grain, RN  Pt BS 363 notified team.  Pain 8/10 continues to have nausea no vomitting

## 2017-01-24 NOTE — Progress Notes (Signed)
Foley placed with second Rn per MD order for acute urinary retention. Pt put out only 100 mL of urine, second bladder scan after foley placed  done showing pt holding on to 410 mL. MD notified will continue to monitor

## 2017-01-24 NOTE — Progress Notes (Signed)
MD notified pt was bladder scanned and holding on to 610 after voiding. Will continue to monitor

## 2017-01-24 NOTE — Progress Notes (Signed)
Pt has pink tinged urine in foley MD notified will continue to monitor

## 2017-01-25 LAB — BASIC METABOLIC PANEL
Anion gap: 12 (ref 5–15)
BUN: 25 mg/dL — AB (ref 6–20)
CALCIUM: 9 mg/dL (ref 8.9–10.3)
CO2: 9 mmol/L — AB (ref 22–32)
Chloride: 111 mmol/L (ref 101–111)
Creatinine, Ser: 1.02 mg/dL — ABNORMAL HIGH (ref 0.44–1.00)
GFR calc Af Amer: >60 mL/min (ref >60)
GLUCOSE: 237 mg/dL — AB (ref 65–99)
POTASSIUM: 4.4 mmol/L (ref 3.5–5.1)
Sodium: 132 mmol/L — ABNORMAL LOW (ref 135–145)

## 2017-01-25 LAB — GLUCOSE, CAPILLARY
GLUCOSE-CAPILLARY: 362 mg/dL — AB (ref 65–99)
GLUCOSE-CAPILLARY: 160 mg/dL — AB (ref 65–99)
Glucose-Capillary: 164 mg/dL — ABNORMAL HIGH (ref 65–99)
Glucose-Capillary: 271 mg/dL — ABNORMAL HIGH (ref 65–99)

## 2017-01-25 MED ORDER — INSULIN GLARGINE 100 UNIT/ML ~~LOC~~ SOLN
20.0000 [IU] | Freq: Every day | SUBCUTANEOUS | Status: DC
  Administered 2017-01-25 – 2017-01-26 (×2): 20 [IU] via SUBCUTANEOUS
  Filled 2017-01-25 (×3): qty 0.2

## 2017-01-25 MED ORDER — METOCLOPRAMIDE HCL 10 MG PO TABS
10.0000 mg | ORAL_TABLET | Freq: Three times a day (TID) | ORAL | Status: DC
  Administered 2017-01-25 – 2017-01-26 (×5): 10 mg via ORAL
  Filled 2017-01-25 (×5): qty 1

## 2017-01-25 NOTE — Progress Notes (Signed)
Inpatient Diabetes Program Recommendations  AACE/ADA: New Consensus Statement on Inpatient Glycemic Control (2015)  Target Ranges:  Prepandial:   less than 140 mg/dL      Peak postprandial:   less than 180 mg/dL (1-2 hours)      Critically ill patients:  140 - 180 mg/dL   Lab Results  Component Value Date   GLUCAP 160 (H) 01/25/2017   HGBA1C 9.3 (H) 01/22/2017   Review of Glycemic Control  Diabetes history: DM 1 Outpatient Diabetes medications: Novolog 1-10 units (1 for every 10 grams of carbs), Lantus 10 units Daily, Was taking 25 units in previous admissions Current orders for Inpatient glycemic control: Lantus 20 units, Novolog Sensitive Correction 0-9 units tid + Novolog 3 units tid meal coverage  Consult: Uncontrolled Blood Sugars  Inpatient Diabetes Program Recommendations:    Glucose 362 this am. A1c 9.3% on 01/22/2017. Glucose control is complicated by her gastroparesis flare and PO intake. Patient had taken Lantus 25 units at home in previous admissions. Lunchtime glucose 160 mg/dl. Will watch patient on current regimen.  Thanks,  Tama Headings RN, MSN, [place] Inpatient Diabetes Coordinator Team Pager [phone] (8a-5p)

## 2017-01-25 NOTE — Plan of Care (Addendum)
Overnight patient complained about excessive dry mouth, respirations 30-40, hr 140s, CBG 308. Patient given antiemetics for nausea and ultram for pain. Notified team, ordered one time dose of insulin novolog 7units. Patient responded well CBG decreased to 270, some improvement in mouth dryness. Respirations even unlabored within normal rate. Encouraged patient to continue to drink plenty of water.   Patient complained of bleeding from urethra, scant blood noted in tubing and redness swelling at insertion site. Leg strap repositioned to minimize pulling of catheter. Will continue to monitor. Progressing Clinical Measurements: Ability to maintain clinical measurements within normal limits will improve 01/25/2017 0442 - Progressing by Verne Grain, RN Will remain free from infection 01/25/2017 0442 - Progressing by Verne Grain, RN Diagnostic test results will improve 01/25/2017 0442 - Progressing by Verne Grain, RN Respiratory complications will improve 01/25/2017 0442 - Progressing by Verne Grain, RN Cardiovascular complication will be avoided 01/25/2017 0442 - Progressing by Verne Grain, RN

## 2017-01-25 NOTE — Progress Notes (Signed)
PROGRESS NOTE    [name]  QTM:[id number] DOB: Jul 23, 1990 DOA: 01/21/2017 PCP: Vicenta Aly, FNP     Brief Narrative:  [name] is a 27 yo female with past medical history of type 1 diabetes mellitus with diabetic gastroparesis and hypertension who to the hospital with 1 day history of nausea, vomiting, diarrhea associated with crampy abdominal pain.  She was admitted for intractable nausea and vomiting secondary to gastroparesis.  Assessment & Plan:   Principal Problem:   Intractable nausea and vomiting Active Problems:   Uncontrolled type 1 diabetes mellitus (HCC)   Diabetic gastroparesis (HCC)   Abdominal pain   Hypertension   Nausea & vomiting   Nausea and vomiting   Intractable nausea and vomiting secondary to diabetic gastroparesis -Patient has had previous gastric emptying study done in July 09, 2016 and was noted to have significantly delayed gastric emptying. -Started on IV Reglan, transition to PO today  -Continue antiemetic -Limit narcotics in diabetic gastroparesis  -Advance to carb modified diet  -Ambulate   AKI -In setting of urinary retention, bladder scan > 934mL -Renal US with thickened bladder, no hydronephrosis  -Improving, trend BMP   Acute urinary retention -Discontinue foley and void trial today   Asymptomatic pyuria  -UA with small leukocytes, WBC. No bacteria seen. Urine culture was not sent.  -Elevated WBC and lactic acid likely due to dehydration in setting of above -Stop Rocephin -Afebrile, WBC normal   Uncontrolled type 1 diabetes mellitus, hyperglycemia -Ha1c 9.3  -Continue lantus, SS novolog. Continue to titrate dosing   Essential hypertension -Continue lopressor, hold lisinopril in AKI     DVT prophylaxis: lovenox Code Status: full Family Communication: no family at bedside Disposition Plan: pending improvement   Consultants:   none  Procedures:   none  Antimicrobials:  Anti-infectives (From  admission, onward)   Start     Dose/Rate Route Frequency Ordered Stop   01/23/17 2200  cefTRIAXone (ROCEPHIN) 1 g in dextrose 5 % 50 mL IVPB  Status:  Discontinued     1 g 100 mL/hr over 30 Minutes Intravenous Every 24 hours 01/22/17 0012 01/23/17 1311   01/21/17 2145  cefTRIAXone (ROCEPHIN) 1 g in dextrose 5 % 50 mL IVPB     1 g 100 mL/hr over 30 Minutes Intravenous  Once 01/21/17 2140 01/22/17 0130       Subjective: Last vomiting last night, none today. No appetite. Continues to ask for morphine.   Objective: Vitals:   01/24/17 2217 01/24/17 2242 01/25/17 0510 01/25/17 0916  BP: 114/73  123/72 125/88  Pulse: (!) 152 (!) 147 (!) 118 (!) 139  Resp: (!) 33  18   Temp: 97.7 F (36.5 C)  98.5 F (36.9 C)   TempSrc: Oral  Oral   SpO2: 100%  100%   Weight:      Height:        Intake/Output Summary (Last 24 hours) at 01/25/2017 1200 Last data filed at 01/25/2017 1043 Gross per 24 hour  Intake 1061 ml  Output 2300 ml  Net -1239 ml   Filed Weights   01/22/17 1457  Weight: 53 kg (116 lb 13.5 oz)    Examination:  General exam: Appears calm Respiratory system: Clear to auscultation. Respiratory effort normal. Cardiovascular system: S1 & S2 heard, tachycardic, regular rhythm. No JVD, murmurs, rubs, gallops or clicks. No pedal edema. Gastrointestinal system: Abdomen is nondistended, soft and nontender. No organomegaly or masses felt. Decreased bowel sounds heard. Central nervous system: Alert and oriented.  No focal neurological deficits. Extremities: Symmetric 5 x 5 power. Skin: No rashes, lesions or ulcers Psychiatry: Judgement and insight appear normal. Mood & affect appropriate.   Data Reviewed: I have personally reviewed following labs and imaging studies  CBC: Recent Labs  Lab 01/21/17 0959 01/22/17 0244 01/24/17 0228  WBC 10.5 14.5* 9.6  HGB 13.9 11.5* 14.4  HCT 43.7 36.5 44.7  MCV 89.7 88.2 90.1  PLT 384 428* 956   Basic Metabolic Panel: Recent Labs  Lab  01/21/17 0959 01/22/17 0244 01/24/17 0228 01/25/17 0340  NA 142 138 133* 132*  K 4.0 3.7 4.7 4.4  CL 105 108 108 111  CO2 26 19* 7* 9*  GLUCOSE 105* 246* 363* 237*  BUN 15 9 16  25*  CREATININE 0.62 0.54 1.20* 1.02*  CALCIUM 10.0 7.6* 8.8* 9.0   GFR: Estimated Creatinine Clearance: 69.3 mL/min (A) (by C-G formula based on SCr of 1.02 mg/dL (H)). Liver Function Tests: Recent Labs  Lab 01/21/17 1326 01/22/17 0244  AST 34 21  ALT 19 15  ALKPHOS 127* 109  BILITOT 0.7 0.8  PROT 7.7 6.6  ALBUMIN 4.1 3.3*   Recent Labs  Lab 01/21/17 1326  LIPASE 18   No results for input(s): AMMONIA in the last 168 hours. Coagulation Profile: No results for input(s): INR, PROTIME in the last 168 hours. Cardiac Enzymes: No results for input(s): CKTOTAL, CKMB, CKMBINDEX, TROPONINI in the last 168 hours. BNP (last 3 results) No results for input(s): PROBNP in the last 8760 hours. HbA1C: No results for input(s): HGBA1C in the last 72 hours. CBG: Recent Labs  Lab 01/24/17 1213 01/24/17 1658 01/24/17 2219 01/25/17 0021 01/25/17 0814  GLUCAP 294* 298* 308* 271* 362*   Lipid Profile: No results for input(s): CHOL, HDL, LDLCALC, TRIG, CHOLHDL, LDLDIRECT in the last 72 hours. Thyroid Function Tests: No results for input(s): TSH, T4TOTAL, FREET4, T3FREE, THYROIDAB in the last 72 hours. Anemia Panel: No results for input(s): VITAMINB12, FOLATE, FERRITIN, TIBC, IRON, RETICCTPCT in the last 72 hours. Sepsis Labs: Recent Labs  Lab 01/21/17 1703 01/21/17 2132 01/22/17 0649  LATICACIDVEN 3.04* 2.16* 1.4    No results found for this or any previous visit (from the past 240 hour(s)).     Radiology Studies: US Renal  Result Date: 01/25/2017 CLINICAL DATA:  Acute renal failure.  Urinary retention. EXAM: RENAL / URINARY TRACT ULTRASOUND COMPLETE COMPARISON:  Abdominal CT 10/23/2016 FINDINGS: Right Kidney: Length: 11.3 cm. Mild caliceal distention without frank hydronephrosis. Echogenicity  within normal limits. No mass visualized. Left Kidney: Length: 11.5 cm. Echogenicity within normal limits. No mass or hydronephrosis visualized. Bladder: Foley catheter in the urinary bladder. Intravesicular debris with bladder wall thickening. IMPRESSION: 1. Bladder wall thickening with intravesicular debris suggesting inflammation or infection. Foley catheter in place. 2. Mild right caliceal prominence. No frank hydronephrosis. Normal sonographic appearance of the left kidney. Electronically Signed   By: Jeb Levering M.D.   On: 01/25/2017 02:15      Scheduled Meds: . dicyclomine  20 mg Oral BID  . enoxaparin (LOVENOX) injection  40 mg Subcutaneous Daily  . insulin aspart  0-9 Units Subcutaneous TID WC  . insulin aspart  3 Units Subcutaneous TID WC  . insulin glargine  20 Units Subcutaneous Daily  . metoCLOPramide  10 mg Oral TID AC & HS  . metoprolol tartrate  12.5 mg Oral BID  . oxybutynin  2.5 mg Oral TID   Continuous Infusions:    LOS: 3 days  Time spent: 30 minutes   Dessa Phi, DO [facility] [email or web address] Password [email or web address] 01/25/2017, 12:00 PM

## 2017-01-26 LAB — BASIC METABOLIC PANEL
ANION GAP: 17 — AB (ref 5–15)
BUN: 19 mg/dL (ref 6–20)
BUN: 23 mg/dL — AB (ref 6–20)
BUN: 23 mg/dL — AB (ref 6–20)
CALCIUM: 9.1 mg/dL (ref 8.9–10.3)
CO2: 10 mmol/L — ABNORMAL LOW (ref 22–32)
Calcium: 9.1 mg/dL (ref 8.9–10.3)
Calcium: 9.1 mg/dL (ref 8.9–10.3)
Chloride: 106 mmol/L (ref 101–111)
Chloride: 111 mmol/L (ref 101–111)
Chloride: 110 mmol/L (ref 101–111)
Creatinine, Ser: 1.09 mg/dL — ABNORMAL HIGH (ref 0.44–1.00)
Creatinine, Ser: 1.13 mg/dL — ABNORMAL HIGH (ref 0.44–1.00)
Creatinine, Ser: 1.05 mg/dL — ABNORMAL HIGH (ref 0.44–1.00)
GFR calc Af Amer: >60 mL/min (ref >60)
GFR calc Af Amer: >60 mL/min (ref >60)
GFR calc non Af Amer: >60 mL/min (ref >60)
GFR calc non Af Amer: >60 mL/min (ref >60)
GLUCOSE: 162 mg/dL — AB (ref 65–99)
GLUCOSE: 190 mg/dL — AB (ref 65–99)
Glucose, Bld: 333 mg/dL — ABNORMAL HIGH (ref 65–99)
POTASSIUM: 4.9 mmol/L (ref 3.5–5.1)
Potassium: 4.1 mmol/L (ref 3.5–5.1)
Potassium: 3.7 mmol/L (ref 3.5–5.1)
SODIUM: 133 mmol/L — AB (ref 135–145)
Sodium: 134 mmol/L — ABNORMAL LOW (ref 135–145)
Sodium: 131 mmol/L — ABNORMAL LOW (ref 135–145)

## 2017-01-26 LAB — PHOSPHORUS: Phosphorus: 1.8 mg/dL — ABNORMAL LOW (ref 2.5–4.6)

## 2017-01-26 LAB — BLOOD GAS, ARTERIAL
Acid-base deficit: 19.7 mmol/L — ABNORMAL HIGH (ref 0.0–2.0)
BICARBONATE: 6.4 mmol/L — AB (ref 20.0–28.0)
Drawn by: 521601
FIO2: 21
O2 Saturation: 97.9 %
PO2 ART: 119 mmHg — AB (ref 83.0–108.0)
Patient temperature: 98.8
pH, Arterial: 7.272 — ABNORMAL LOW (ref 7.350–7.450)

## 2017-01-26 LAB — GLUCOSE, CAPILLARY
GLUCOSE-CAPILLARY: 173 mg/dL — AB (ref 65–99)
GLUCOSE-CAPILLARY: 182 mg/dL — AB (ref 65–99)
GLUCOSE-CAPILLARY: 213 mg/dL — AB (ref 65–99)
GLUCOSE-CAPILLARY: 218 mg/dL — AB (ref 65–99)
GLUCOSE-CAPILLARY: 228 mg/dL — AB (ref 65–99)
GLUCOSE-CAPILLARY: 296 mg/dL — AB (ref 65–99)
GLUCOSE-CAPILLARY: 373 mg/dL — AB (ref 65–99)
Glucose-Capillary: 193 mg/dL — ABNORMAL HIGH (ref 65–99)
Glucose-Capillary: 193 mg/dL — ABNORMAL HIGH (ref 65–99)
Glucose-Capillary: 150 mg/dL — ABNORMAL HIGH (ref 65–99)

## 2017-01-26 LAB — MAGNESIUM: Magnesium: 1.8 mg/dL (ref 1.7–2.4)

## 2017-01-26 MED ORDER — ACETONE (URINE) TEST VI STRP
1.0000 | ORAL_STRIP | Freq: Once | Status: DC
  Filled 2017-01-26: qty 1

## 2017-01-26 MED ORDER — SODIUM CHLORIDE 0.9 % IV SOLN
INTRAVENOUS | Status: DC
  Administered 2017-01-26: 12:00:00 via INTRAVENOUS

## 2017-01-26 MED ORDER — DEXTROSE-NACL 5-0.45 % IV SOLN
INTRAVENOUS | Status: DC
  Administered 2017-01-26 – 2017-01-28 (×5): via INTRAVENOUS

## 2017-01-26 MED ORDER — ONDANSETRON HCL 4 MG PO TABS: 4.0000 mg | ORAL_TABLET | Freq: Three times a day (TID) | ORAL | 0 refills | Status: DC | PRN

## 2017-01-26 MED ORDER — INSULIN GLARGINE 100 UNIT/ML ~~LOC~~ SOLN: 25.0000 [IU] | Freq: Every day | SUBCUTANEOUS | 0 refills | Status: DC

## 2017-01-26 MED ORDER — SODIUM CHLORIDE 0.9 % IV BOLUS (SEPSIS)
2000.0000 mL | Freq: Once | INTRAVENOUS | Status: AC
  Administered 2017-01-26: 2000 mL via INTRAVENOUS

## 2017-01-26 MED ORDER — METOCLOPRAMIDE HCL 10 MG PO TABS: 10.0000 mg | ORAL_TABLET | Freq: Four times a day (QID) | ORAL | 0 refills | Status: DC

## 2017-01-26 MED ORDER — METOCLOPRAMIDE HCL 5 MG/ML IJ SOLN
10.0000 mg | Freq: Four times a day (QID) | INTRAMUSCULAR | Status: DC
  Administered 2017-01-26 – 2017-01-27 (×4): 10 mg via INTRAVENOUS
  Filled 2017-01-26 (×4): qty 2

## 2017-01-26 MED ORDER — SODIUM CHLORIDE 0.9 % IV SOLN
INTRAVENOUS | Status: DC
  Administered 2017-01-26 – 2017-01-27 (×2): via INTRAVENOUS

## 2017-01-26 MED ORDER — DEXTROSE 50 % IV SOLN
25.0000 mL | INTRAVENOUS | Status: DC | PRN
Start: 2017-01-26 — End: 2017-01-29

## 2017-01-26 MED ORDER — SODIUM CHLORIDE 0.9 % IV SOLN
INTRAVENOUS | Status: AC
  Administered 2017-01-26: 1.6 [IU]/h via INTRAVENOUS
  Filled 2017-01-26: qty 1

## 2017-01-26 MED ORDER — INSULIN REGULAR BOLUS VIA INFUSION
0.0000 [IU] | Freq: Three times a day (TID) | INTRAVENOUS | Status: AC
  Filled 2017-01-26: qty 10

## 2017-01-26 MED ORDER — PROMETHAZINE HCL 12.5 MG PO TABS: 12.5000 mg | ORAL_TABLET | Freq: Four times a day (QID) | ORAL | 0 refills | Status: DC | PRN

## 2017-01-26 NOTE — Progress Notes (Signed)
Patient needs to be transferred but we are waiting on a stepdown bed.

## 2017-01-26 NOTE — Plan of Care (Signed)
  Progressing Elimination: Will not experience complications related to bowel motility 01/26/2017 0823 - Progressing by Verne Grain, RN Note Continues to have nausea given phenergan prn and scheduled reglan. Will not experience complications related to urinary retention 01/26/2017 0823 - Progressing by Verne Grain, RN Note No issues with urinary retention overnight.

## 2017-01-26 NOTE — Evaluation (Signed)
Physical Therapy Evaluation Patient Details Name: [name] MRN: [id number] DOB: 10/28/1990 Today's Date: 01/26/2017   History of Present Illness  27yo female with c/o nausea/vomiting, diarrhea. Possible gastroparesis vs marijuana abuse. PMH anxiety, gastroparesis, GERD, hepatic stenosi, HTN, liver mass, pancreatitis, DM uncontrolled   Clinical Impression   RN reports patient is doing well, should be stable to participate with PT. Patient received in bed, pleasant and willing to participate with therapy, able to complete bed mobility with Mod(I) and demonstrating WFL functional strength. Patient then became light headed and dizzy sitting at EOB, returned to supine and orthostatic measures were taken with the following results:  Supine: 103/76 HR 140 Seated: 116/64 HR 144 Standing 82/51 HR 156  Patient unable to stand for more than 1 minute to get BP reading, returned to bed with RN present and attending. Overall patient appears typical for mobility and strength given her age, unable to test gait in hallway or stairs due to limitations secondary to medical issues. RN aware of patient status and that PT recommends second session prior to DC; PT to return when patient medically stable to examine gait and stair mobility.     Follow Up Recommendations Other (comment)(TBD once medical symptoms have resolved/patient is able to mobilize safely )    Equipment Recommendations  Other (comment)(TBD once medical symptoms have resolved/patient is able to mobilize safely )    Recommendations for Other Services       Precautions / Restrictions        Mobility  Bed Mobility Overal bed mobility: Modified Independent                Transfers Overall transfer level: Needs assistance   Transfers: Sit to/from Stand Sit to Stand: Supervision         General transfer comment: due to dizziness/light headedness in standing likely due to orthostatic pressures, patient physically able to  perform transfer with Mod(I)  Ambulation/Gait             General Gait Details: DNT due to high pulse rate/orthostatic results   Stairs            Wheelchair Mobility    Modified Rankin (Stroke Patients Only)       Balance Overall balance assessment: Needs assistance Sitting-balance support: Bilateral upper extremity supported;Feet supported Sitting balance-Leahy Scale: Good     Standing balance support: No upper extremity supported;During functional activity Standing balance-Leahy Scale: Poor Standing balance comment: possibly resulting from symptoms of orthostatic hypotension                             Pertinent Vitals/Pain Pain Assessment: No/denies pain    Home Living Family/patient expects to be discharged to:: Private residence Living Arrangements: Children;Parent Available Help at Discharge: Family Type of Home: Apartment Home Access: Stairs to enter Entrance Stairs-Rails: Can reach both Entrance Stairs-Number of Steps: flight  Home Layout: One level Home Equipment: None      Prior Function Level of Independence: Independent               Hand Dominance        Extremity/Trunk Assessment   Upper Extremity Assessment Upper Extremity Assessment: Overall WFL for tasks assessed    Lower Extremity Assessment Lower Extremity Assessment: Overall WFL for tasks assessed    Cervical / Trunk Assessment Cervical / Trunk Assessment: Normal  Communication   Communication: No difficulties  Cognition Arousal/Alertness: Awake/alert Behavior During Therapy: [facility]  for tasks assessed/performed;Flat affect Overall Cognitive Status: Within Functional Limits for tasks assessed                                        General Comments General comments (skin integrity, edema, etc.): this evaluation limited due to physiologic measures- orthostatic testing results and high HR     Exercises     Assessment/Plan    PT  Assessment Patient needs continued PT services  PT Problem List Decreased mobility;Decreased safety awareness;Decreased coordination       PT Treatment Interventions Therapeutic activities;Gait training;Therapeutic exercise;Patient/family education;Stair training;Balance training;Functional mobility training;Neuromuscular re-education    PT Goals (Current goals can be found in the Care Plan section)  Acute Rehab PT Goals Patient Stated Goal: to go home  PT Goal Formulation: With patient Time For Goal Achievement: 02/02/17 Potential to Achieve Goals: Good    Frequency Min 3X/week   Barriers to discharge        Co-evaluation               AM-PAC PT "6 Clicks" Daily Activity  Outcome Measure Difficulty turning over in bed (including adjusting bedclothes, sheets and blankets)?: None Difficulty moving from lying on back to sitting on the side of the bed? : None Difficulty sitting down on and standing up from a chair with arms (e.g., wheelchair, bedside commode, etc,.)?: None Help needed moving to and from a bed to chair (including a wheelchair)?: A Little Help needed walking in hospital room?: A Little Help needed climbing 3-5 steps with a railing? : A Little 6 Click Score: 21    End of Session   Activity Tolerance: Treatment limited secondary to medical complications (Comment) Patient left: in bed;with call bell/phone within reach;with nursing/sitter in room(RN present and attending ) Nurse Communication: Mobility status;Other (comment)(PT recommendation for 2nd session before DC ) PT Visit Diagnosis: Unsteadiness on feet (R26.81);Difficulty in walking, not elsewhere classified (R26.2)    Time: 8250-5397 PT Time Calculation (min) (ACUTE ONLY): 10 min   Charges:   PT Evaluation $PT Eval Low Complexity: 1 Low     PT G Codes:   PT G-Codes **NOT FOR INPATIENT CLASS** Functional Assessment Tool Used: AM-PAC 6 Clicks Basic Mobility;Clinical judgement    Deniece Ree  PT, DPT, CBIS  Supplemental Physical Therapist [place]   Pager [phone]

## 2017-01-26 NOTE — Progress Notes (Addendum)
  PROGRESS NOTE  I was paged by RN regarding patient's dizziness. When standing, patient became very dizzy with positive orthostatic VS. IVF started. Called by RN again regarding patient's elevated blood sugars. ABG obtained which revealed severe metabolic acidosis. BMP and UA is pending. Start insulin gtt and transfer to stepdown unit.    Dessa Phi, DO [facility] [email or web address] Password [facility] 01/26/2017, 12:07 PM

## 2017-01-26 NOTE — Progress Notes (Signed)
The patient's mother was concerned she may have DKA, MD was notified and lab work has been done and I will retrieve a UA sample per MD order.

## 2017-01-26 NOTE — Progress Notes (Signed)
Patient's AMS has not changed, she has been running a heart rate of 140-150 all day. Patient's respirations are now 32. Patient states she really does not feel good. Rapid was called.

## 2017-01-26 NOTE — Progress Notes (Signed)
MD was paged about patient stating she is very dizzy when working with PT. I had orthostatic VS done with Physical Therapy. Patient was unable to stand for 3 minutes she had to sit down. Patient has now vomited three times.

## 2017-01-26 NOTE — Discharge Instructions (Signed)
Gastroparesis °Gastroparesis, also called delayed gastric emptying, is a condition in which food takes longer than normal to empty from the stomach. The condition is usually long-lasting (chronic). °What are the causes? °This condition may be caused by: °· An endocrine disorder, such as hypothyroidism or diabetes. Diabetes is the most common cause of this condition. °· A nervous system disease, such as Parkinson disease or multiple sclerosis. °· Cancer, infection, or surgery of the stomach or vagus nerve. °· A connective tissue disorder, such as scleroderma. °· Certain medicines. ° °In most cases, the cause is not known. °What increases the risk? °This condition is more likely to develop in: °· People with certain disorders, including endocrine disorders, eating disorders, amyloidosis, and scleroderma. °· People with certain diseases, including Parkinson disease or multiple sclerosis. °· People with cancer or infection of the stomach or vagus nerve. °· People who have had surgery on the stomach or vagus nerve. °· People who take certain medicines. °· Women. ° °What are the signs or symptoms? °Symptoms of this condition include: °· An early feeling of fullness when eating. °· Nausea. °· Weight loss. °· Vomiting. °· Heartburn. °· Abdominal bloating. °· Inconsistent blood glucose levels. °· Lack of appetite. °· Acid from the stomach coming up into the esophagus (gastroesophageal reflux). °· Spasms of the stomach. ° °Symptoms may come and go. °How is this diagnosed? °This condition is diagnosed with tests, such as: °· Tests that check how long it takes food to move through the stomach and intestines. These tests include: °? Upper gastrointestinal (GI) series. In this test, X-rays of the intestines are taken after you drink a liquid. The liquid makes the intestines show up better on the X-rays. °? Gastric emptying scintigraphy. In this test, scans are taken after you eat food that contains a small amount of radioactive  material. °? Wireless capsule GI monitoring system. This test involves swallowing a capsule that records information about movement through the stomach. °· Gastric manometry. This test measures electrical and muscular activity in the stomach. It is done with a thin tube that is passed down the throat and into the stomach. °· Endoscopy. This test checks for abnormalities in the lining of the stomach. It is done with a long, thin tube that is passed down the throat and into the stomach. °· An ultrasound. This test can help rule out gallbladder disease or pancreatitis as a cause of your symptoms. It uses sound waves to take pictures of the inside of your body. ° °How is this treated? °There is no cure for gastroparesis. This condition may be managed with: °· Treatment of the underlying condition causing the gastroparesis. °· Lifestyle changes, including exercise and dietary changes. Dietary changes can include: °? Changes in what and when you eat. °? Eating smaller meals more often. °? Eating low-fat foods. °? Eating low-fiber forms of high-fiber foods, such as cooked vegetables instead of raw vegetables. °? Having liquid foods in place of solid foods. Liquid foods are easier to digest. °· Medicines. These may be given to control nausea and vomiting and to stimulate stomach muscles. °· Getting food through a feeding tube. This may be done in severe cases. °· A gastric neurostimulator. This is a device that is inserted into the body with surgery. It helps improve stomach emptying and control nausea and vomiting. ° °Follow these instructions at home: °· Follow your health care provider's instructions about exercise and diet. °· Take medicines only as directed by your health care provider. °Contact a   health care provider if: °· Your symptoms do not improve with treatment. °· You have new symptoms. °Get help right away if: °· You have severe abdominal pain that does not improve with treatment. °· You have nausea that does  not go away. °· You cannot keep fluids down. °This information is not intended to replace advice given to you by your health care provider. Make sure you discuss any questions you have with your health care provider. °Document Released: 01/05/2005 Document Revised: 06/13/2015 Document Reviewed: 01/01/2014 °Elsevier Interactive Patient Education © 2018 [employer] ° °

## 2017-01-26 NOTE — Progress Notes (Signed)
Nutrition Brief Note  Spoke with patient. She declines any nutrition needs at this time. Asked if she had an understanding of diet to manage gastroparesis and patient states yes. Claims she had someone come talk to her.  Will monitor.  Satira Anis. Amore Grater, MS, RD LDN Inpatient Clinical Dietitian Pager [phone]

## 2017-01-26 NOTE — Discharge Summary (Signed)
Physician Discharge Summary  [name] ATF:[id number] DOB: Aug 02, 1990 DOA: 01/21/2017  PCP: Vicenta Aly, FNP  Admit date: 01/21/2017 Discharge date: 01/26/2017  Admitted From: Home Disposition:  Home   Recommendations for Outpatient Follow-up:  1. Follow up with PCP in 1 week 1. Follow up with Dr. Francetta Found, Endocrinology, at [facility] in 1 week  2. Please obtain BMP in 1 week  3. Lisinopril held due to AKI in the hospital. Resume as outpatient as long as Cr remains stable.   Discharge Condition: Stable CODE STATUS: Full  Diet recommendation: Carb modified   Brief/Interim Summary: [name] is a 27 yo female with past medical history of type 1 diabetes mellitus with diabetic gastroparesis and hypertension who to the hospital with 1 day history of nausea, vomiting, diarrhea associated with crampy abdominal pain.  She was admitted for intractable nausea and vomiting secondary to gastroparesis. She was treated with IV reglan and antiemetics. Narcotics were discouraged. She had very slow improvement over her hospitalization.   Discharge Diagnoses:  Principal Problem:   Intractable nausea and vomiting Active Problems:   Uncontrolled type 1 diabetes mellitus (HCC)   Diabetic gastroparesis (HCC)   Abdominal pain   Hypertension   Nausea & vomiting   Nausea and vomiting   Intractable nausea and vomiting secondary to diabetic gastroparesis -Patient has had previous gastric emptying study done in July 09, 2016 and was noted to have significantly delayed gastric emptying. -Started on IV Reglan, transitioned to PO   -Continue antiemetic -Limit narcotics in diabetic gastroparesis  -Advance to carb modified diet  -Ambulate  -Slow improvement, states she is tolerating clear liquids and was able to eat soft diet (eggs) this morning. Nausea well-controlled. Patient states she feels ready for discharge home   AKI -In setting of urinary retention, bladder scan >  967mL -Renal US with thickened bladder, no hydronephrosis  -Stable  Acute urinary retention -Discontinued foley. Voiding on her own without issues   Asymptomatic pyuria  -UA with small leukocytes, WBC. No bacteria seen. Urine culture was not sent.  -Elevated WBC and lactic acid likely due to dehydration in setting of above -Stop Rocephin -Afebrile, WBC normal   Uncontrolled type 1 diabetes mellitus, hyperglycemia -Ha1c 9.3  -Continue lantus, SS novolog. Continue to titrate dosing. Follow up with PCP and Endocrinology   Essential hypertension -Continue lopressor, hold lisinopril in AKI. Resume as long as Cr stable as outpatient.    Discharge Instructions  Discharge Instructions    Call MD for:  difficulty breathing, headache or visual disturbances   Complete by:  As directed    Call MD for:  extreme fatigue   Complete by:  As directed    Call MD for:  persistant dizziness or light-headedness   Complete by:  As directed    Call MD for:  persistant nausea and vomiting   Complete by:  As directed    Call MD for:  severe uncontrolled pain   Complete by:  As directed    Call MD for:  temperature >100.4   Complete by:  As directed    Diet Carb Modified   Complete by:  As directed    Discharge instructions   Complete by:  As directed    You were cared for by a hospitalist during your hospital stay. If you have any questions about your discharge medications or the care you received while you were in the hospital after you are discharged, you can call the unit and asked to speak  with the hospitalist on call if the hospitalist that took care of you is not available. Once you are discharged, your primary care physician will handle any further medical issues. Please note that NO REFILLS for any discharge medications will be authorized once you are discharged, as it is imperative that you return to your primary care physician (or establish a relationship with a primary care physician if  you do not have one) for your aftercare needs so that they can reassess your need for medications and monitor your lab values.   Increase activity slowly   Complete by:  As directed      Allergies as of 01/26/2017      Reactions   Peanut-containing Drug Products Swelling, Other (See Comments)   Reaction:  Swelling of mouth and lips    Food Swelling, Other (See Comments)   Pt is allergic to strawberries.   Reaction:  Swelling of mouth and lips    Ultram [tramadol] Itching      Medication List    STOP taking these medications   lisinopril 2.5 MG tablet Commonly known as:  PRINIVIL,ZESTRIL     TAKE these medications   albuterol 108 (90 Base) MCG/ACT inhaler Commonly known as:  PROVENTIL HFA;VENTOLIN HFA Inhale 1-2 puffs into the lungs every 6 (six) hours as needed for wheezing or shortness of breath.   dicyclomine 20 MG tablet Commonly known as:  BENTYL Take 1 tablet (20 mg total) by mouth 2 (two) times daily.   glucagon 1 MG injection Commonly known as:  GLUCAGON EMERGENCY Inject 1 mg into the muscle once as needed (For very low blood sugars. May repeat in 15-20 minutes if needed.).   insulin glargine 100 UNIT/ML injection Commonly known as:  LANTUS Inject 0.25 mLs (25 Units total) into the skin daily. What changed:  how much to take   metoCLOPramide 10 MG tablet Commonly known as:  REGLAN Take 1 tablet (10 mg total) by mouth every 6 (six) hours.   metoprolol tartrate 25 MG tablet Commonly known as:  LOPRESSOR Take 0.5 tablets (12.5 mg total) by mouth 2 (two) times daily.   NOVOLOG FLEXPEN 100 UNIT/ML FlexPen Generic drug:  insulin aspart Inject 1-10 Units into the skin 3 (three) times daily with meals. Pt uses 1 unit for every 10 grams of carbs with meals and snacks for BS greater than 50. Sliding scale 1 units/50 when CBG > 150   ondansetron 4 MG tablet Commonly known as:  ZOFRAN Take 1 tablet (4 mg total) by mouth every 8 (eight) hours as needed for nausea,  vomiting or refractory nausea / vomiting. What changed:  reasons to take this   oxybutynin 5 MG tablet Commonly known as:  DITROPAN Take 0.5 tablets (2.5 mg total) by mouth 3 (three) times daily.   promethazine 12.5 MG tablet Commonly known as:  PHENERGAN Take 1 tablet (12.5 mg total) by mouth every 6 (six) hours as needed for nausea or vomiting.   RESTASIS MULTIDOSE 0.05 % ophthalmic emulsion Generic drug:  cycloSPORINE Place 1 drop into both eyes every 12 (twelve) hours.   Vitamin D (Ergocalciferol) 50000 units Caps capsule Commonly known as:  DRISDOL Take 50,000 Units by mouth once a week. On [place], [place], [place]. Schedule an appointment as soon as possible for a visit in 3 day(s).   Specialty:  Nurse Practitioner Contact information: [place] [address] [phone]  Gerome Apley, MD. Schedule an appointment as soon as possible for a visit in 1 week(s).   Specialty:  Endocrinology Contact information: [address] [place] [id number] [phone]          Allergies  Allergen Reactions  . Peanut-Containing Drug Products Swelling and Other (See Comments)    Reaction:  Swelling of mouth and lips   . 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   Reaction:  Swelling of mouth and lips   . Ultram [Tramadol] Itching    Consultations:  None   Procedures/Studies: US Renal  Result Date: 01/25/2017 CLINICAL DATA:  Acute renal failure.  Urinary retention. EXAM: RENAL / URINARY TRACT ULTRASOUND COMPLETE COMPARISON:  Abdominal CT 10/23/2016 FINDINGS: Right Kidney: Length: 11.3 cm. Mild caliceal distention without frank hydronephrosis. Echogenicity within normal limits. No mass visualized. Left Kidney: Length: 11.5 cm. Echogenicity within normal limits. No mass or hydronephrosis visualized. Bladder: Foley catheter in the urinary bladder.  Intravesicular debris with bladder wall thickening. IMPRESSION: 1. Bladder wall thickening with intravesicular debris suggesting inflammation or infection. Foley catheter in place. 2. Mild right caliceal prominence. No frank hydronephrosis. Normal sonographic appearance of the left kidney. Electronically Signed   By: Jeb Levering M.D.   On: 01/25/2017 02:15      Discharge Exam: Vitals:   01/26/17 0509 01/26/17 0851  BP: 121/81 103/77  Pulse: (!) 119 (!) 130  Resp: 16   Temp: 98.7 F (37.1 C)   SpO2: 100%    Vitals:   01/25/17 1409 01/26/17 0000 01/26/17 0509 01/26/17 0851  BP: 125/85 115/84 121/81 103/77  Pulse: (!) 124 (!) 142 (!) 119 (!) 130  Resp: 16 (!) 24 16   Temp: 98.3 F (36.8 C) 98.4 F (36.9 C) 98.7 F (37.1 C)   TempSrc:  Oral Oral   SpO2: 100% 100% 100%   Weight:      Height:        General: Pt is alert, awake, not in acute distress Cardiovascular: tachycardic, regular rhythm, S1/S2 +, no rubs, no gallops Respiratory: CTA bilaterally, no wheezing, no rhonchi Abdominal: Soft, NT, ND, bowel sounds + Extremities: no edema, no cyanosis    The results of significant diagnostics from this hospitalization (including imaging, microbiology, ancillary and laboratory) are listed below for reference.     Microbiology: No results found for this or any previous visit (from the past 240 hour(s)).   Labs: BNP (last 3 results) No results for input(s): BNP in the last 8760 hours. Basic Metabolic Panel: Recent Labs  Lab 01/21/17 0959 01/22/17 0244 01/24/17 0228 01/25/17 0340 01/26/17 0336  NA 142 138 133* 132* 133*  K 4.0 3.7 4.7 4.4 4.1  CL 105 108 108 111 106  CO2 26 19* 7* 9* 10*  GLUCOSE 105* 246* 363* 237* 333*  BUN 15 9 16  25* 19  CREATININE 0.62 0.54 1.20* 1.02* 1.09*  CALCIUM 10.0 7.6* 8.8* 9.0 9.1   Liver Function Tests: Recent Labs  Lab 01/21/17 1326 01/22/17 0244  AST 34 21  ALT 19 15  ALKPHOS 127* 109  BILITOT 0.7 0.8  PROT 7.7 6.6   ALBUMIN 4.1 3.3*   Recent Labs  Lab 01/21/17 1326  LIPASE 18   No results for input(s): AMMONIA in the last 168 hours. CBC: Recent Labs  Lab 01/21/17 0959 01/22/17 0244 01/24/17 0228  WBC 10.5 14.5* 9.6  HGB 13.9 11.5* 14.4  HCT 43.7 36.5 44.7  MCV 89.7 88.2 90.1  PLT 384 428* 282   Cardiac Enzymes: No results for input(s): CKTOTAL, CKMB, CKMBINDEX, TROPONINI in the last 168 hours. BNP: Invalid input(s): POCBNP CBG: Recent Labs  Lab 01/25/17 0814 01/25/17 1210 01/25/17 1659 01/25/17 2359 01/26/17 0801  GLUCAP 362* 160* 164* 296* 373*   D-Dimer No results for input(s): DDIMER in the last 72 hours. Hgb A1c No results for input(s): HGBA1C in the last 72 hours. Lipid Profile No results for input(s): CHOL, HDL, LDLCALC, TRIG, CHOLHDL, LDLDIRECT in the last 72 hours. Thyroid function studies No results for input(s): TSH, T4TOTAL, T3FREE, THYROIDAB in the last 72 hours.  Invalid input(s): FREET3 Anemia work up No results for input(s): VITAMINB12, FOLATE, FERRITIN, TIBC, IRON, RETICCTPCT in the last 72 hours. Urinalysis    Component Value Date/Time   COLORURINE STRAW (A) 01/21/2017 2037   APPEARANCEUR HAZY (A) 01/21/2017 2037   LABSPEC 1.021 01/21/2017 2037   PHURINE 7.0 01/21/2017 2037   GLUCOSEU >=500 (A) 01/21/2017 2037   GLUCOSEU >=1000 11/07/2012 1205   HGBUR NEGATIVE 01/21/2017 2037   BILIRUBINUR NEGATIVE 01/21/2017 2037   KETONESUR 80 (A) 01/21/2017 2037   PROTEINUR 30 (A) 01/21/2017 2037   UROBILINOGEN 0.2 11/24/2014 1045   NITRITE NEGATIVE 01/21/2017 2037   LEUKOCYTESUR SMALL (A) 01/21/2017 2037   Sepsis Labs Invalid input(s): PROCALCITONIN,  WBC,  LACTICIDVEN Microbiology No results found for this or any previous visit (from the past 240 hour(s)).   Time coordinating discharge: 40 minutes  SIGNED:  Dessa Phi, DO [facility] Pager [phone]  If 7PM-7AM, please contact night-coverage [email or web address] Password [facility] 01/26/2017,  11:38 AM

## 2017-01-26 NOTE — Progress Notes (Signed)
Patient became a step down patient on our floor instead of transferring. The orders were initiated, she was put on a progressive care monitor.

## 2017-01-26 NOTE — Progress Notes (Signed)
MD notified of BP of 103/77 and HR of 130

## 2017-01-26 NOTE — Care Management Note (Signed)
Case Management Note  Patient Details  Name: Jenese Mischke MRN: [id number] Date of Birth: 16-May-1990  Subjective/Objective:     Type I DM, gastroparesis, intractable vomiting              Action/Plan: Discharge Planning: NCM spoke to pt and states she is able to afford medications with her Medicaid. States she had the insulin pump as a child but is currently taking Lantus qhs. She lives at home with parents and her young child. Waiting PT recommendations. Pt may need a RW for home.   PCP   Expected Discharge Date:  01/26/17               Expected Discharge Plan:  Home/Self Care  In-House Referral:  NA  Discharge planning Services  CM Consult  Post Acute Care Choice:  NA Choice offered to:  NA  DME Arranged:   DME Agency:    HH Arranged:   HH Agency:    Status of Service:  In process, will continue to follow  If discussed at Long Length of Stay Meetings, dates discussed:  01/26/2017 (discussed with Medical Advisor on frequent readmissions)  Additional Comments:  Erenest Rasher, RN 01/26/2017, 12:46 PM

## 2017-01-26 NOTE — Progress Notes (Signed)
MD notified of patient's ABG results. MD wants to transfer to a step down unit

## 2017-01-27 DIAGNOSIS — N179 Acute kidney failure, unspecified: Secondary | ICD-10-CM

## 2017-01-27 DIAGNOSIS — G43A0 Cyclical vomiting, not intractable: Secondary | ICD-10-CM

## 2017-01-27 DIAGNOSIS — R338 Other retention of urine: Secondary | ICD-10-CM

## 2017-01-27 LAB — BASIC METABOLIC PANEL
ANION GAP: 7 (ref 5–15)
Anion gap: 8 (ref 5–15)
Anion gap: 7 (ref 5–15)
BUN: 6 mg/dL (ref 6–20)
BUN: 11 mg/dL (ref 6–20)
BUN: 18 mg/dL (ref 6–20)
CHLORIDE: 114 mmol/L — AB (ref 101–111)
CHLORIDE: 117 mmol/L — AB (ref 101–111)
CO2: 15 mmol/L — ABNORMAL LOW (ref 22–32)
CO2: 14 mmol/L — AB (ref 22–32)
CO2: 9 mmol/L — AB (ref 22–32)
CREATININE: 0.75 mg/dL (ref 0.44–1.00)
Calcium: 7.4 mg/dL — ABNORMAL LOW (ref 8.9–10.3)
Calcium: 8.2 mg/dL — ABNORMAL LOW (ref 8.9–10.3)
Calcium: 8 mg/dL — ABNORMAL LOW (ref 8.9–10.3)
Chloride: 115 mmol/L — ABNORMAL HIGH (ref 101–111)
Creatinine, Ser: 0.64 mg/dL (ref 0.44–1.00)
Creatinine, Ser: 0.49 mg/dL (ref 0.44–1.00)
GFR calc Af Amer: >60 mL/min (ref >60)
GFR calc Af Amer: >60 mL/min (ref >60)
GFR calc non Af Amer: >60 mL/min (ref >60)
GFR calc non Af Amer: >60 mL/min (ref >60)
GLUCOSE: 152 mg/dL — AB (ref 65–99)
Glucose, Bld: 127 mg/dL — ABNORMAL HIGH (ref 65–99)
Glucose, Bld: 179 mg/dL — ABNORMAL HIGH (ref 65–99)
POTASSIUM: 2.6 mmol/L — AB (ref 3.5–5.1)
POTASSIUM: 3 mmol/L — AB (ref 3.5–5.1)
POTASSIUM: 3.9 mmol/L (ref 3.5–5.1)
SODIUM: 134 mmol/L — AB (ref 135–145)
SODIUM: 135 mmol/L (ref 135–145)
Sodium: 137 mmol/L (ref 135–145)

## 2017-01-27 LAB — URINALYSIS, ROUTINE W REFLEX MICROSCOPIC
Bacteria, UA: NONE SEEN
Bilirubin Urine: NEGATIVE
GLUCOSE, UA: NEGATIVE mg/dL
Ketones, ur: 80 mg/dL — AB
NITRITE: NEGATIVE
PH: 6 (ref 5.0–8.0)
Protein, ur: 100 mg/dL — AB
SPECIFIC GRAVITY, URINE: 1.015 (ref 1.005–1.030)
Squamous Epithelial / LPF: NONE SEEN

## 2017-01-27 LAB — GLUCOSE, CAPILLARY
GLUCOSE-CAPILLARY: 146 mg/dL — AB (ref 65–99)
GLUCOSE-CAPILLARY: 178 mg/dL — AB (ref 65–99)
GLUCOSE-CAPILLARY: 177 mg/dL — AB (ref 65–99)
GLUCOSE-CAPILLARY: 152 mg/dL — AB (ref 65–99)
GLUCOSE-CAPILLARY: 144 mg/dL — AB (ref 65–99)
Glucose-Capillary: 102 mg/dL — ABNORMAL HIGH (ref 65–99)
Glucose-Capillary: 123 mg/dL — ABNORMAL HIGH (ref 65–99)
Glucose-Capillary: 129 mg/dL — ABNORMAL HIGH (ref 65–99)
Glucose-Capillary: 130 mg/dL — ABNORMAL HIGH (ref 65–99)
Glucose-Capillary: 149 mg/dL — ABNORMAL HIGH (ref 65–99)
Glucose-Capillary: 157 mg/dL — ABNORMAL HIGH (ref 65–99)
Glucose-Capillary: 159 mg/dL — ABNORMAL HIGH (ref 65–99)
Glucose-Capillary: 159 mg/dL — ABNORMAL HIGH (ref 65–99)
Glucose-Capillary: 162 mg/dL — ABNORMAL HIGH (ref 65–99)
Glucose-Capillary: 169 mg/dL — ABNORMAL HIGH (ref 65–99)
Glucose-Capillary: 174 mg/dL — ABNORMAL HIGH (ref 65–99)
Glucose-Capillary: 176 mg/dL — ABNORMAL HIGH (ref 65–99)
Glucose-Capillary: 183 mg/dL — ABNORMAL HIGH (ref 65–99)
Glucose-Capillary: 203 mg/dL — ABNORMAL HIGH (ref 65–99)

## 2017-01-27 LAB — MAGNESIUM
MAGNESIUM: 1.4 mg/dL — AB (ref 1.7–2.4)
MAGNESIUM: 1.4 mg/dL — AB (ref 1.7–2.4)
MAGNESIUM: 1.3 mg/dL — AB (ref 1.7–2.4)
Magnesium: 1.5 mg/dL — ABNORMAL LOW (ref 1.7–2.4)
Magnesium: 1.3 mg/dL — ABNORMAL LOW (ref 1.7–2.4)

## 2017-01-27 LAB — PHOSPHORUS
PHOSPHORUS: 1.2 mg/dL — AB (ref 2.5–4.6)
Phosphorus: 1.2 mg/dL — ABNORMAL LOW (ref 2.5–4.6)
Phosphorus: <1 mg/dL — CL (ref 2.5–4.6)
Phosphorus: 1.1 mg/dL — ABNORMAL LOW (ref 2.5–4.6)

## 2017-01-27 MED ORDER — POTASSIUM CHLORIDE 10 MEQ/100ML IV SOLN
10.0000 meq | INTRAVENOUS | Status: AC
  Administered 2017-01-27 (×5): 10 meq via INTRAVENOUS
  Filled 2017-01-27 (×3): qty 100

## 2017-01-27 MED ORDER — POTASSIUM CHLORIDE IN NACL 40-0.9 MEQ/L-% IV SOLN
INTRAVENOUS | Status: DC
  Administered 2017-01-27 – 2017-01-29 (×4): 75 mL/h via INTRAVENOUS
  Filled 2017-01-27 (×4): qty 1000

## 2017-01-27 MED ORDER — SODIUM CHLORIDE 0.9% FLUSH
10.0000 mL | INTRAVENOUS | Status: DC | PRN
  Administered 2017-01-29: 10 mL
  Filled 2017-01-27: qty 40

## 2017-01-27 MED ORDER — METOCLOPRAMIDE HCL 5 MG/ML IJ SOLN
10.0000 mg | Freq: Three times a day (TID) | INTRAMUSCULAR | Status: DC
  Administered 2017-01-27 – 2017-01-29 (×6): 10 mg via INTRAVENOUS
  Filled 2017-01-27 (×6): qty 2

## 2017-01-27 MED ORDER — SODIUM CHLORIDE 0.9% FLUSH
10.0000 mL | Freq: Two times a day (BID) | INTRAVENOUS | Status: DC
  Administered 2017-01-27 – 2017-01-28 (×2): 10 mL

## 2017-01-27 MED ORDER — INSULIN ASPART 100 UNIT/ML ~~LOC~~ SOLN
0.0000 [IU] | Freq: Three times a day (TID) | SUBCUTANEOUS | Status: DC
  Administered 2017-01-27 – 2017-01-28 (×2): 2 [IU] via SUBCUTANEOUS
  Administered 2017-01-28: 9 [IU] via SUBCUTANEOUS
  Administered 2017-01-28 – 2017-01-29 (×2): 3 [IU] via SUBCUTANEOUS
  Administered 2017-01-29: 7 [IU] via SUBCUTANEOUS

## 2017-01-27 MED ORDER — INSULIN GLARGINE 100 UNIT/ML ~~LOC~~ SOLN
10.0000 [IU] | Freq: Every day | SUBCUTANEOUS | Status: DC
  Administered 2017-01-27 – 2017-01-28 (×2): 10 [IU] via SUBCUTANEOUS
  Filled 2017-01-27 (×3): qty 0.1

## 2017-01-27 NOTE — Progress Notes (Signed)
Insulin drip has been stopped per order and potassium is still running.

## 2017-01-27 NOTE — Progress Notes (Addendum)
PROGRESS NOTE    [name]  IWL:[id number] DOB: 04-Feb-1990 DOA: 01/21/2017 PCP: Vicenta Aly, FNP     Brief Narrative:  [name] is a 27 yo female with past medical history of type 1 diabetes mellitus with diabetic gastroparesis and hypertension who to the hospital with 1 day history of nausea, vomiting, diarrhea associated with crampy abdominal pain.  She was admitted for intractable nausea and vomiting secondary to gastroparesis.  Assessment & Plan:  Diabetic ketoacidosis -Brittle diabetic at baseline uses 10 units of Lantus and NovoLog sliding scale -Improving with insulin drip and IV fluids -Anion gap is corrected however bicarbonate still quite low will continue drip for a few more hours, repeat BMP at noon still pending -Transition to Lantus and NovoLog based on next set of labs -Clear liquids as tolerated  Intractable nausea and vomiting secondary to diabetic gastroparesis -Patient has had previous gastric emptying study done in July 09, 2016 and was noted to have significantly delayed gastric emptying. -Likely worsened by cannabis use on admission -Restart IV Reglan -Clears -Limit narcotics as tolerated  AKI -In setting of urinary retention, bladder scan > 925mL -Renal US with thickened bladder, no hydronephrosis  -Improving  Asymptomatic pyuria  -UA with small leukocytes, WBC. No bacteria seen. Urine culture was not sent.  -Was briefly on antibiotics and then stopped remains afebrile without leukocytosis  Uncontrolled type 1 diabetes mellitus, hyperglycemia -Ha1c 9.3  -Continue lantus, SS novolog. Continue to titrate dosing   Essential hypertension -Continue lopressor, hold lisinopril in AKI   DVT prophylaxis: lovenox Code Status: full Family Communication: no family at bedside Disposition Plan: pending improvement   Consultants:   none  Procedures:   none  Antimicrobials:  Anti-infectives (From admission, onward)   Start      Dose/Rate Route Frequency Ordered Stop   01/23/17 2200  cefTRIAXone (ROCEPHIN) 1 g in dextrose 5 % 50 mL IVPB  Status:  Discontinued     1 g 100 mL/hr over 30 Minutes Intravenous Every 24 hours 01/22/17 0012 01/23/17 1311   01/21/17 2145  cefTRIAXone (ROCEPHIN) 1 g in dextrose 5 % 50 mL IVPB     1 g 100 mL/hr over 30 Minutes Intravenous  Once 01/21/17 2140 01/22/17 0130       Subjective: -Very uncomfortable, severe nausea, no vomiting today thus far  Objective: Vitals:   01/27/17 1200 01/27/17 1214 01/27/17 1300 01/27/17 1327  BP: (!) 147/109 (!) 150/111  (!) 147/104  Pulse: (!) 120 (!) 114 (!) 117 (!) 111  Resp: 16 15    Temp:  99.5 F (37.5 C)    TempSrc:  Oral    SpO2: 98% 100% 100% 100%  Weight:      Height:        Intake/Output Summary (Last 24 hours) at 01/27/2017 1452 Last data filed at 01/26/2017 2200 Gross per 24 hour  Intake 363.33 ml  Output 200 ml  Net 163.33 ml   Filed Weights   01/22/17 1457  Weight: 53 kg (116 lb 13.5 oz)    Examination:  Gen: Awake, Alert, Oriented X 3, uncomfortable, chronically ill-appearing HEENT: PERRLA, Neck supple, no JVD Lungs: Good air movement bilaterally, CTAB CVS: RRR,No Gallops,Rubs or new Murmurs Abd: soft, Non tender, non distended, BS present Extremities: No Cyanosis, Clubbing or edema Skin: no new rashes   Data Reviewed: I have personally reviewed following labs and imaging studies  CBC: Recent Labs  Lab 01/21/17 0959 01/22/17 0244 01/24/17 0228  WBC 10.5 14.5* 9.6  HGB 13.9 11.5* 14.4  HCT 43.7 36.5 44.7  MCV 89.7 88.2 90.1  PLT 384 428* 448   Basic Metabolic Panel: Recent Labs  Lab 01/26/17 0336 01/26/17 1532 01/26/17 2049 01/27/17 0033 01/27/17 0715 01/27/17 1020  NA 133* 134* 131* 134* 135  --   K 4.1 3.7 4.9 3.9 3.0*  --   CL 106 111 110 117* 114*  --   CO2 10* <7* <7* 9* 14*  --   GLUCOSE 333* 162* 190* 127* 179*  --   BUN 19 23* 23* 18 11  --   CREATININE 1.09* 1.13* 1.05* 0.75 0.64  --     CALCIUM 9.1 9.1 9.1 7.4* 8.2*  --   MG  --   --  1.8 1.5* 1.4* 1.3*  PHOS  --   --  1.8* 1.2* 1.2* <1.0*   GFR: Estimated Creatinine Clearance: 88.4 mL/min (by C-G formula based on SCr of 0.64 mg/dL). Liver Function Tests: Recent Labs  Lab 01/21/17 1326 01/22/17 0244  AST 34 21  ALT 19 15  ALKPHOS 127* 109  BILITOT 0.7 0.8  PROT 7.7 6.6  ALBUMIN 4.1 3.3*   Recent Labs  Lab 01/21/17 1326  LIPASE 18   No results for input(s): AMMONIA in the last 168 hours. Coagulation Profile: No results for input(s): INR, PROTIME in the last 168 hours. Cardiac Enzymes: No results for input(s): CKTOTAL, CKMB, CKMBINDEX, TROPONINI in the last 168 hours. BNP (last 3 results) No results for input(s): PROBNP in the last 8760 hours. HbA1C: No results for input(s): HGBA1C in the last 72 hours. CBG: Recent Labs  Lab 01/27/17 1009 01/27/17 1114 01/27/17 1216 01/27/17 1318 01/27/17 1424  GLUCAP 183* 203* 159* 123* 152*   Lipid Profile: No results for input(s): CHOL, HDL, LDLCALC, TRIG, CHOLHDL, LDLDIRECT in the last 72 hours. Thyroid Function Tests: No results for input(s): TSH, T4TOTAL, FREET4, T3FREE, THYROIDAB in the last 72 hours. Anemia Panel: No results for input(s): VITAMINB12, FOLATE, FERRITIN, TIBC, IRON, RETICCTPCT in the last 72 hours. Sepsis Labs: Recent Labs  Lab 01/21/17 1703 01/21/17 2132 01/22/17 0649  LATICACIDVEN 3.04* 2.16* 1.4    No results found for this or any previous visit (from the past 240 hour(s)).     Radiology Studies: No results found.    Scheduled Meds: . acetone (urine) test  1 strip Does not apply Once  . dicyclomine  20 mg Oral BID  . enoxaparin (LOVENOX) injection  40 mg Subcutaneous Daily  . insulin regular  0-10 Units Intravenous TID WC  . metoCLOPramide (REGLAN) injection  10 mg Intravenous Q6H  . metoprolol tartrate  12.5 mg Oral BID  . oxybutynin  2.5 mg Oral TID   Continuous Infusions: . sodium chloride 75 mL/hr (01/27/17  1123)  . dextrose 5 % and 0.45% NaCl 125 mL/hr at 01/27/17 1123  . insulin (NOVOLIN-R) infusion 0.9 Units/hr (01/27/17 1426)     LOS: 5 days    Time spent: 35 minutes   Domenic Polite, MD [facility] [email or web address] Password [email or web address] 01/27/2017, 2:52 PM

## 2017-01-27 NOTE — Progress Notes (Signed)
CRITICAL VALUE ALERT  Critical Value:  K+ 2.6  Date & Time Notied:  01/27/2017 at [place]  Provider Notified: Broadus John  Orders Received/Actions taken: IV potassium runs.

## 2017-01-27 NOTE — Progress Notes (Signed)
Physical Therapy Treatment Patient Details Name: [name] MRN: [id number] DOB: 1990/04/20 Today's Date: 01/27/2017    History of Present Illness 27yo female with c/o nausea/vomiting, diarrhea. Possible gastroparesis vs marijuana abuse. PMH anxiety, gastroparesis, GERD, hepatic stenosi, HTN, liver mass, pancreatitis, DM uncontrolled     PT Comments    Per RN patient is doing better, RN requests PT wait until afternoon to see patient and avoid aggressive intervention today however given patient status. Patient received in bed, sleeping but easily woken and agreeable to participation in skilled PT session. She continues to complete bed mobility on a Mod(I) basis, and requires S and Min guard for functional transfers and light activity at bedside. Note HR increase to 135-138BPM from resting HR approximately 115BPM with brief marching and side-stepping at bedside, BP remained stable with positional changes today. Patient continues to be limited by general malaise and medical status at this time, however based on general assessment of mobility/assist needed thus far, she may benefit from skilled HHPT services moving forward- PT to continue to closely monitor patient and will update DC recommendations/equipment recommendations as appropriate.   Follow Up Recommendations  Home health PT(pending progress, will update as appropriate )     Equipment Recommendations  None recommended by PT(pending progress, will update as appropriate )    Recommendations for Other Services       Precautions / Restrictions Precautions Precautions: Fall Restrictions Weight Bearing Restrictions: No    Mobility  Bed Mobility Overal bed mobility: Modified Independent                Transfers Overall transfer level: Needs assistance Equipment used: Rolling walker (2 wheeled);None Transfers: Sit to/from Stand Sit to Stand: Supervision         General transfer comment: performed with and without  RW, cues for standing up straight and tall; performed marching in place and lateral side stepping noting HR increase to 135-137BPM with light standing activities   Ambulation/Gait Ambulation/Gait assistance: Min guard Ambulation Distance (Feet): 8 Feet(100ft x2 laps ) Assistive device: None Gait Pattern/deviations: Trunk flexed;Step-through pattern;Narrow base of support     General Gait Details: limited due to high pulse rate maintaining at 135-138BPM with light standing activities    Stairs            Wheelchair Mobility    Modified Rankin (Stroke Patients Only)       Balance Overall balance assessment: Needs assistance Sitting-balance support: Bilateral upper extremity supported;Feet supported Sitting balance-Leahy Scale: Good     Standing balance support: No upper extremity supported;During functional activity Standing balance-Leahy Scale: Fair Standing balance comment: improving, now only mildly unsteady                             Cognition Arousal/Alertness: Awake/alert Behavior During Therapy: WFL for tasks assessed/performed Overall Cognitive Status: Within Functional Limits for tasks assessed                                        Exercises      General Comments General comments (skin integrity, edema, etc.): activity with PT continues to be limited due to patient status- limited by malaise and high HR with light activities this session; also note RN request to avoid aggressive intervention today       Pertinent Vitals/Pain Pain Assessment: 0-10 Pain Score: 8  Pain  Location: stomach  Pain Descriptors / Indicators: Sharp;Aching Pain Intervention(s): Limited activity within patient's tolerance;Monitored during session;Repositioned    Home Living                      Prior Function            PT Goals (current goals can now be found in the care plan section) Acute Rehab PT Goals Patient Stated Goal: to go home   PT Goal Formulation: With patient Time For Goal Achievement: 02/02/17 Potential to Achieve Goals: Good Progress towards PT goals: Progressing toward goals    Frequency    Min 3X/week      PT Plan Discharge plan needs to be updated;Equipment recommendations need to be updated    Co-evaluation              AM-PAC PT "6 Clicks" Daily Activity  Outcome Measure  Difficulty turning over in bed (including adjusting bedclothes, sheets and blankets)?: None Difficulty moving from lying on back to sitting on the side of the bed? : None Difficulty sitting down on and standing up from a chair with arms (e.g., wheelchair, bedside commode, etc,.)?: None Help needed moving to and from a bed to chair (including a wheelchair)?: A Little Help needed walking in hospital room?: A Little Help needed climbing 3-5 steps with a railing? : A Little 6 Click Score: 21    End of Session Equipment Utilized During Treatment: Gait belt Activity Tolerance: Patient tolerated treatment well;Treatment limited secondary to medical complications (Comment)(HR 135-138BPM in standing ) Patient left: in bed;with call bell/phone within reach   PT Visit Diagnosis: Unsteadiness on feet (R26.81);Difficulty in walking, not elsewhere classified (R26.2)     Time: 1017-5102 PT Time Calculation (min) (ACUTE ONLY): 12 min  Charges:  $Therapeutic Activity: 8-22 mins                    G Codes:  Functional Assessment Tool Used: AM-PAC 6 Clicks Basic Mobility;Clinical judgement    Deniece Ree PT, DPT, CBIS  Supplemental Physical Therapist [place]   Pager [phone]

## 2017-01-27 NOTE — Progress Notes (Signed)
Patient is improving and doctor wants to wait for next lab work before going to the next step. I will continue to monitor.

## 2017-01-27 NOTE — Progress Notes (Signed)
MD changed DKA orders to phase 2 since patient has improved.

## 2017-01-27 NOTE — Progress Notes (Signed)
MD notified of patient now being on 0.6 units/hr. Patient is improving greatly.

## 2017-01-27 NOTE — Progress Notes (Signed)
I had to dilute and slow the potassium runs down due to a burning sensation complaint from the patient.

## 2017-01-27 NOTE — Progress Notes (Signed)
CRITICAL VALUE ALERT  Critical Value:  Phosphorus less than 1.0  Date & Time Notied:  01/27/2017 at 1150  Provider Notified: Broadus John MD  Orders Received/Actions taken: No orders received MD is waiting for next blood draw.

## 2017-01-27 NOTE — Progress Notes (Signed)
No response about the phosphorus level from MD. I talked to pharmacy they said they would page her.

## 2017-01-27 NOTE — Progress Notes (Signed)
Patient is vomiting three times and threw up the tylenol.

## 2017-01-28 LAB — MAGNESIUM
MAGNESIUM: 1.4 mg/dL — AB (ref 1.7–2.4)
MAGNESIUM: 1.6 mg/dL — AB (ref 1.7–2.4)
Magnesium: 1.3 mg/dL — ABNORMAL LOW (ref 1.7–2.4)
Magnesium: 1.8 mg/dL (ref 1.7–2.4)

## 2017-01-28 LAB — BASIC METABOLIC PANEL
Anion gap: 10 (ref 5–15)
CALCIUM: 7.8 mg/dL — AB (ref 8.9–10.3)
CO2: 18 mmol/L — ABNORMAL LOW (ref 22–32)
CREATININE: 0.42 mg/dL — AB (ref 0.44–1.00)
Chloride: 105 mmol/L (ref 101–111)
GFR calc Af Amer: >60 mL/min (ref >60)
Glucose, Bld: 203 mg/dL — ABNORMAL HIGH (ref 65–99)
POTASSIUM: 3 mmol/L — AB (ref 3.5–5.1)
SODIUM: 133 mmol/L — AB (ref 135–145)

## 2017-01-28 LAB — URINALYSIS, ROUTINE W REFLEX MICROSCOPIC
BILIRUBIN URINE: NEGATIVE
Glucose, UA: >=500 mg/dL — AB
KETONES UR: 20 mg/dL — AB
Nitrite: NEGATIVE
PROTEIN: NEGATIVE mg/dL
SPECIFIC GRAVITY, URINE: 1.015 (ref 1.005–1.030)
pH: 7 (ref 5.0–8.0)

## 2017-01-28 LAB — PHOSPHORUS
PHOSPHORUS: 1.4 mg/dL — AB (ref 2.5–4.6)
Phosphorus: <1 mg/dL — CL (ref 2.5–4.6)
Phosphorus: <1 mg/dL — CL (ref 2.5–4.6)
Phosphorus: 1 mg/dL — CL (ref 2.5–4.6)

## 2017-01-28 LAB — GLUCOSE, CAPILLARY
GLUCOSE-CAPILLARY: 353 mg/dL — AB (ref 65–99)
GLUCOSE-CAPILLARY: 188 mg/dL — AB (ref 65–99)
Glucose-Capillary: 241 mg/dL — ABNORMAL HIGH (ref 65–99)

## 2017-01-28 LAB — CREATININE, SERUM: CREATININE: 0.57 mg/dL (ref 0.44–1.00)

## 2017-01-28 MED ORDER — MAGNESIUM SULFATE 2 GM/50ML IV SOLN
2.0000 g | Freq: Once | INTRAVENOUS | Status: AC
  Administered 2017-01-28: 2 g via INTRAVENOUS
  Filled 2017-01-28: qty 50

## 2017-01-28 MED ORDER — POTASSIUM PHOSPHATES 15 MMOLE/5ML IV SOLN
20.0000 mmol | Freq: Once | INTRAVENOUS | Status: AC
  Administered 2017-01-28: 20 mmol via INTRAVENOUS
  Filled 2017-01-28: qty 6.67

## 2017-01-28 MED ORDER — DEXTROSE 5 % IV SOLN
1.0000 g | INTRAVENOUS | Status: DC
  Administered 2017-01-28 – 2017-01-29 (×2): 1 g via INTRAVENOUS
  Filled 2017-01-28 (×2): qty 10

## 2017-01-28 NOTE — Progress Notes (Signed)
PROGRESS NOTE    Tiaunna Buford  IOX:[id number] DOB: [birth year] DOA: 01/21/2017 PCP: Vicenta Aly, FNP     Brief Narrative:  Marilin Kofman is a 27 yo female with past medical history of type 1 diabetes mellitus with diabetic gastroparesis and hypertension who to the hospital with 1 day history of nausea, vomiting, diarrhea associated with crampy abdominal pain.  She was admitted for intractable nausea and vomiting secondary to gastroparesis.  Assessment & Plan:  Diabetic ketoacidosis -Brittle diabetic at baseline uses 10 units of Lantus and NovoLog sliding scale -Improving with insulin drip and IV fluids -Anion gap corrected and transitioned over to Lantus and NovoLog sliding scale last evening -Still has moderate amounts of nausea unable to advance diet to be on clears today  Intractable nausea and vomiting secondary to diabetic gastroparesis -Patient has had previous gastric emptying study done in July 09, 2016 and was noted to have significantly delayed gastric emptying. -Likely worsened by cannabis use on admission -Having low-grade fevers and abnormal urinalysis with some odour-suspect possible urinary infection, started IV ceftriaxone and follow-up urine cultures -Continue IV Reglan, limit narcotics, continue clears today and advance as tolerated  AKI -In setting of urinary retention, bladder scan > 925mL -Renal US with thickened bladder, no hydronephrosis  -Improved  Possible UTI/with low-grade fevers, abnormal urinalysis with large leukocyte esterase and pyuria and numerous WBCs -Started IV ceftriaxone last night, continue -Follow-up urine cultures  Uncontrolled type 1 diabetes mellitus, hyperglycemia -Ha1c 9.3  -Continue lantus, SS novolog. Continue to titrate dosing   Essential hypertension -Continue lopressor, hold lisinopril in AKI   DVT prophylaxis: lovenox Code Status: full Family Communication: no family at bedside Disposition Plan: pending  improvement   Consultants:   none  Procedures:   none  Antimicrobials:  Anti-infectives (From admission, onward)   Start     Dose/Rate Route Frequency Ordered Stop   01/28/17 1100  cefTRIAXone (ROCEPHIN) 1 g in dextrose 5 % 50 mL IVPB     1 g 100 mL/hr over 30 Minutes Intravenous Every 24 hours 01/28/17 1021     01/23/17 2200  cefTRIAXone (ROCEPHIN) 1 g in dextrose 5 % 50 mL IVPB  Status:  Discontinued     1 g 100 mL/hr over 30 Minutes Intravenous Every 24 hours 01/22/17 0012 01/23/17 1311   01/21/17 2145  cefTRIAXone (ROCEPHIN) 1 g in dextrose 5 % 50 mL IVPB     1 g 100 mL/hr over 30 Minutes Intravenous  Once 01/21/17 2140 01/22/17 0130       Subjective: -Feels a little better however unable to eat yet, still with significant nausea  Objective: Vitals:   01/28/17 0427 01/28/17 0527 01/28/17 0627 01/28/17 0826  BP: (!) 132/97 (!) 151/104 (!) 159/101 131/80  Pulse:      Resp: 20     Temp:      TempSrc:      SpO2:      Weight:      Height:        Intake/Output Summary (Last 24 hours) at 01/28/2017 1326 Last data filed at 01/28/2017 1127 Gross per 24 hour  Intake 3871.75 ml  Output 3650 ml  Net 221.75 ml   Filed Weights   01/22/17 1457  Weight: 53 kg (116 lb 13.5 oz)    Examination:  Gen: Awake, Alert, Oriented X 3, chronically ill and uncomfortable appearing HEENT: PERRLA, Neck supple, no JVD Lungs: Good air movement bilaterally, CTAB CVS: RRR,No Gallops,Rubs or new Murmurs Abd: soft, Non tender, non  distended, BS present Extremities: No Cyanosis, Clubbing or edema Skin: no new rashes  Data Reviewed: I have personally reviewed following labs and imaging studies  CBC: Recent Labs  Lab 01/22/17 0244 01/24/17 0228  WBC 14.5* 9.6  HGB 11.5* 14.4  HCT 36.5 44.7  MCV 88.2 90.1  PLT 428* 270   Basic Metabolic Panel: Recent Labs  Lab 01/26/17 2049 01/27/17 0033 01/27/17 0715  01/27/17 1435 01/27/17 2102 01/28/17 0022 01/28/17 0430  01/28/17 0840 01/28/17 1142  NA 131* 134* 135  --  137  --   --   --   --  133*  K 4.9 3.9 3.0*  --  2.6*  --   --   --   --  3.0*  CL 110 117* 114*  --  115*  --   --   --   --  105  CO2 <7* 9* 14*  --  15*  --   --   --   --  18*  GLUCOSE 190* 127* 179*  --  152*  --   --   --   --  203*  BUN 23* 18 11  --  6  --   --   --   --  <5*  CREATININE 1.05* 0.75 0.64  --  0.49  --   --  0.57  --  0.42*  CALCIUM 9.1 7.4* 8.2*  --  8.0*  --   --   --   --  7.8*  MG 1.8 1.5* 1.4*   < >  --  1.3* 1.3* 1.4* 1.8 1.6*  PHOS 1.8* 1.2* 1.2*   < >  --  <1.0* <1.0* <1.0* 1.4* 1.0*   < > = values in this interval not displayed.   GFR: Estimated Creatinine Clearance: 88.4 mL/min (A) (by C-G formula based on SCr of 0.42 mg/dL (L)). Liver Function Tests: Recent Labs  Lab 01/22/17 0244  AST 21  ALT 15  ALKPHOS 109  BILITOT 0.8  PROT 6.6  ALBUMIN 3.3*   No results for input(s): LIPASE, AMYLASE in the last 168 hours. No results for input(s): AMMONIA in the last 168 hours. Coagulation Profile: No results for input(s): INR, PROTIME in the last 168 hours. Cardiac Enzymes: No results for input(s): CKTOTAL, CKMB, CKMBINDEX, TROPONINI in the last 168 hours. BNP (last 3 results) No results for input(s): PROBNP in the last 8760 hours. HbA1C: No results for input(s): HGBA1C in the last 72 hours. CBG: Recent Labs  Lab 01/27/17 1719 01/27/17 1820 01/27/17 2109 01/28/17 0747 01/28/17 1150  GLUCAP 176* 149* 159* 353* 188*   Lipid Profile: No results for input(s): CHOL, HDL, LDLCALC, TRIG, CHOLHDL, LDLDIRECT in the last 72 hours. Thyroid Function Tests: No results for input(s): TSH, T4TOTAL, FREET4, T3FREE, THYROIDAB in the last 72 hours. Anemia Panel: No results for input(s): VITAMINB12, FOLATE, FERRITIN, TIBC, IRON, RETICCTPCT in the last 72 hours. Sepsis Labs: Recent Labs  Lab 01/21/17 1703 01/21/17 2132 01/22/17 0649  LATICACIDVEN 3.04* 2.16* 1.4    No results found for this or any  previous visit (from the past 240 hour(s)).     Radiology Studies: No results found.    Scheduled Meds: . acetone (urine) test  1 strip Does not apply Once  . dicyclomine  20 mg Oral BID  . enoxaparin (LOVENOX) injection  40 mg Subcutaneous Daily  . insulin aspart  0-9 Units Subcutaneous TID WC  . insulin glargine  10 Units Subcutaneous Daily  . metoCLOPramide (  REGLAN) injection  10 mg Intravenous TID AC  . metoprolol tartrate  12.5 mg Oral BID  . sodium chloride flush  10-40 mL Intracatheter Q12H   Continuous Infusions: . 0.9 % NaCl with KCl 40 mEq / L 75 mL/hr (01/28/17 0725)  . cefTRIAXone (ROCEPHIN)  IV Stopped (01/28/17 1111)  . potassium PHOSPHATE IVPB (mmol)       LOS: 6 days    Time spent: 35 minutes   Domenic Polite, MD [facility] [email or web address] Password [email or web address] 01/28/2017, 1:26 PM

## 2017-01-28 NOTE — Progress Notes (Signed)
Physical Therapy Treatment Patient Details Name: [name] MRN: [id number] DOB: 08-21-1990 Today's Date: 01/28/2017    History of Present Illness 27yo female with c/o nausea/vomiting, diarrhea. Possible gastroparesis vs marijuana abuse. PMH anxiety, gastroparesis, GERD, hepatic stenosi, HTN, liver mass, pancreatitis, DM uncontrolled     PT Comments    Patient received in bed, pleasant and willing to participate in skilled PT services this afternoon; note RN reports patient is doing better and is OK to participate in PT today. She is able to complete all functional bed mobility and transfers with Mod I, able to ambulate approximately 246ft in hallway with no device, no significant unsteadiness noted and gait pattern WFL. Note HR reached only 128BPM with gait in hallway today.  Patient is improving greatly and does not appear to require PT follow up after discharge from hospital, suspect that prior impairments are related more to medical status than physical concerns that skilled PT services can assist with. Plan to F/U with patient for one more skilled treatment session to ensure continuation of functional status as she continues to recover before likely PT sign off.     Follow Up Recommendations  No PT follow up     Equipment Recommendations  None recommended by PT    Recommendations for Other Services       Precautions / Restrictions Precautions Precautions: None Restrictions Weight Bearing Restrictions: No    Mobility  Bed Mobility Overal bed mobility: Modified Independent                Transfers Overall transfer level: Modified independent Equipment used: None Transfers: Sit to/from Stand Sit to Stand: Modified independent (Device/Increase time)         General transfer comment: able to perform with good safety and without significant unsteadiness   Ambulation/Gait Ambulation/Gait assistance: Supervision Ambulation Distance (Feet): 200 Feet Assistive  device: None Gait Pattern/deviations: Trunk flexed;Step-through pattern;Narrow base of support     General Gait Details: gait pattern now WFL, HR up to 128BPM, no signs of distress from patient, no significant unsteadiness or concern for LOB noted    Stairs            Wheelchair Mobility    Modified Rankin (Stroke Patients Only)       Balance Overall balance assessment: Needs assistance Sitting-balance support: Feet supported Sitting balance-Leahy Scale: Good     Standing balance support: No upper extremity supported;During functional activity Standing balance-Leahy Scale: Good                              Cognition Arousal/Alertness: Awake/alert Behavior During Therapy: WFL for tasks assessed/performed Overall Cognitive Status: Within Functional Limits for tasks assessed                                        Exercises      General Comments General comments (skin integrity, edema, etc.): RN reports patietn is doing better, ok to walk in hallway with PT today       Pertinent Vitals/Pain Pain Assessment: No/denies pain Pain Intervention(s): Limited activity within patient's tolerance;Monitored during session    Home Living                      Prior Function            PT Goals (current goals can  now be found in the care plan section) Acute Rehab PT Goals Patient Stated Goal: to go home  PT Goal Formulation: With patient Time For Goal Achievement: 02/02/17 Potential to Achieve Goals: Good Progress towards PT goals: Progressing toward goals    Frequency    Min 2X/week      PT Plan Discharge plan needs to be updated;Frequency needs to be updated;Equipment recommendations need to be updated    Co-evaluation              AM-PAC PT "6 Clicks" Daily Activity  Outcome Measure  Difficulty turning over in bed (including adjusting bedclothes, sheets and blankets)?: None Difficulty moving from lying on back  to sitting on the side of the bed? : None Difficulty sitting down on and standing up from a chair with arms (e.g., wheelchair, bedside commode, etc,.)?: None Help needed moving to and from a bed to chair (including a wheelchair)?: None Help needed walking in hospital room?: None Help needed climbing 3-5 steps with a railing? : A Little 6 Click Score: 23    End of Session Equipment Utilized During Treatment: Gait belt Activity Tolerance: Patient tolerated treatment well Patient left: in bed;with call bell/phone within reach Nurse Communication: Mobility status PT Visit Diagnosis: Unsteadiness on feet (R26.81);Difficulty in walking, not elsewhere classified (R26.2)     Time: 1400-1411 PT Time Calculation (min) (ACUTE ONLY): 11 min  Charges:  $Gait Training: 8-22 mins                    G Codes:  Functional Assessment Tool Used: AM-PAC 6 Clicks Basic Mobility;Clinical judgement    Deniece Ree PT, DPT, CBIS  Supplemental Physical Therapist [place]   Pager [phone]

## 2017-01-28 NOTE — Progress Notes (Signed)
Per MD order patient was transferred to level of care Telemetry. Will continue to monitor.

## 2017-01-28 NOTE — Progress Notes (Signed)
CRITICAL VALUE ALERT  Critical Value:  Phosphorus 1.0  Date & Time Notied:  01/28/2017; 13:19  Provider Notified: Dr. Broadus John  Orders Received/Actions taken: see MAR

## 2017-01-29 LAB — BASIC METABOLIC PANEL
ANION GAP: 11 (ref 5–15)
CHLORIDE: 106 mmol/L (ref 101–111)
CO2: 17 mmol/L — ABNORMAL LOW (ref 22–32)
Calcium: 8.2 mg/dL — ABNORMAL LOW (ref 8.9–10.3)
Creatinine, Ser: 0.53 mg/dL (ref 0.44–1.00)
GFR calc Af Amer: >60 mL/min (ref >60)
GFR calc non Af Amer: >60 mL/min (ref >60)
GLUCOSE: 331 mg/dL — AB (ref 65–99)
POTASSIUM: 5.3 mmol/L — AB (ref 3.5–5.1)
Sodium: 134 mmol/L — ABNORMAL LOW (ref 135–145)

## 2017-01-29 LAB — GLUCOSE, CAPILLARY
GLUCOSE-CAPILLARY: 319 mg/dL — AB (ref 65–99)
GLUCOSE-CAPILLARY: 249 mg/dL — AB (ref 65–99)
Glucose-Capillary: 251 mg/dL — ABNORMAL HIGH (ref 65–99)

## 2017-01-29 LAB — URINE CULTURE: Culture: >=100000 — AB

## 2017-01-29 MED ORDER — INSULIN GLARGINE 100 UNIT/ML ~~LOC~~ SOLN
15.0000 [IU] | Freq: Every day | SUBCUTANEOUS | Status: DC
  Administered 2017-01-29: 15 [IU] via SUBCUTANEOUS
  Filled 2017-01-29: qty 0.15

## 2017-01-29 MED ORDER — CEPHALEXIN 250 MG PO CAPS: 250.0000 mg | ORAL_CAPSULE | Freq: Three times a day (TID) | ORAL | 0 refills | Status: DC

## 2017-01-29 MED ORDER — METOCLOPRAMIDE HCL 5 MG PO TABS
5.0000 mg | ORAL_TABLET | Freq: Three times a day (TID) | ORAL | 0 refills | Status: DC
Start: 2017-01-29 — End: 2017-11-02

## 2017-01-29 NOTE — Discharge Summary (Signed)
Physician Discharge Summary  Ezinne Yogi CVE:[id number] DOB: 1990/08/08 DOA: 01/21/2017  PCP: Vicenta Aly, FNP  Admit date: 01/21/2017 Discharge date: 01/29/2017  Time spent: 35 minutes  Recommendations for Outpatient Follow-up:  PCP in 1 week Endocrinologist Dr.Levy at [place] in 2 weeks Remains high risk for readmission due to brittle type 1DM and severe gastroparesis  Discharge Diagnoses:  Principal Problem:   Intractable nausea and vomiting Active Problems:   Uncontrolled type 1 diabetes mellitus (HCC)   Diabetic gastroparesis (HCC)   Abdominal pain   Hypertension   Nausea & vomiting   Nausea and vomiting   Discharge Condition: improved  Diet recommendation: Diabetic  Filed Weights   01/22/17 1457  Weight: 53 kg (116 lb 13.5 oz)    History of present illness:  [name] is a 27 yo female with past medical history of type 1 diabetes mellitus with diabetic gastroparesis and hypertension who to the hospital with 1 day history of nausea, vomiting, diarrhea associated with crampy abdominal pain  Hospital Course:   Diabetic ketoacidosis -Brittle diabetic at baseline uses 10 units of Lantus and NovoLog sliding scale -Improvedwith insulin drip and IV fluids -Anion gap corrected and transitioned over to Lantus and NovoLog sliding scale last evening -stable now -advised quick FU with her endocrinologist Dr.Levy  Intractable nausea and vomiting secondary to diabetic gastroparesis -Patient has had previous gastric emptying study done in July 09, 2016 and was noted to have significantly delayed gastric emptying. -Likely worsened by cannabis use on admission and possible UTI -Treated with Reglan, limited narcotics use  -diet gradually advanced and tolerating carb mod diet at discharge today  AKI -In setting of dehydration -Renal US with thickened bladder, no hydronephrosis  -resolved  Possible UTI/with low-grade fevers, abnormal urinalysis with large  leukocyte esterase and pyuria and numerous WBCs -Started IV ceftriaxone 2days ago -urine cx pending at the time of discharge but clinically much improved and transitioned to PO keflex, she is anxious to go home today  Uncontrolled type 1 diabetes mellitus, hyperglycemia -Ha1c 9.3  -Continue lantus, dose increased back to home dose of 25units and SSI  Essential hypertension -Continue lopressor, stopped lisinopril due to AKI initially but didn't restart due to soft BPs  Discharge Exam: Vitals:   01/28/17 2120 01/29/17 0502  BP: 117/75 124/87  Pulse: 90 90  Resp: 16 16  Temp: 99.8 F (37.7 C) 98.8 F (37.1 C)  SpO2: 100% 100%    General: AAOx3 Cardiovascular: S1S2/RRR Respiratory: CTAB  Discharge Instructions   Discharge Instructions    Call MD for:  difficulty breathing, headache or visual disturbances   Complete by:  As directed    Call MD for:  extreme fatigue   Complete by:  As directed    Call MD for:  persistant dizziness or light-headedness   Complete by:  As directed    Call MD for:  persistant nausea and vomiting   Complete by:  As directed    Call MD for:  severe uncontrolled pain   Complete by:  As directed    Call MD for:  temperature >100.4   Complete by:  As directed    Diet Carb Modified   Complete by:  As directed    Diet Carb Modified   Complete by:  As directed    Discharge instructions   Complete by:  As directed    You were cared for by a hospitalist during your hospital stay. If you have any questions about your discharge medications or the  care you received while you were in the hospital after you are discharged, you can call the unit and asked to speak with the hospitalist on call if the hospitalist that took care of you is not available. Once you are discharged, your primary care physician will handle any further medical issues. Please note that NO REFILLS for any discharge medications will be authorized once you are discharged, as it is  imperative that you return to your primary care physician (or establish a relationship with a primary care physician if you do not have one) for your aftercare needs so that they can reassess your need for medications and monitor your lab values.   Increase activity slowly   Complete by:  As directed    Increase activity slowly   Complete by:  As directed      Allergies as of 01/29/2017      Reactions   Peanut-containing Drug Products Swelling, Other (See Comments)   Reaction:  Swelling of mouth and lips    Food Swelling, Other (See Comments)   Pt is allergic to strawberries.   Reaction:  Swelling of mouth and lips    Ultram [tramadol] Itching      Medication List    STOP taking these medications   lisinopril 2.5 MG tablet Commonly known as:  PRINIVIL,ZESTRIL     TAKE these medications   albuterol 108 (90 Base) MCG/ACT inhaler Commonly known as:  PROVENTIL HFA;VENTOLIN HFA Inhale 1-2 puffs into the lungs every 6 (six) hours as needed for wheezing or shortness of breath.   cephALEXin 250 MG capsule Commonly known as:  KEFLEX Take 1 capsule (250 mg total) by mouth 3 (three) times daily. For 3days   dicyclomine 20 MG tablet Commonly known as:  BENTYL Take 1 tablet (20 mg total) by mouth 2 (two) times daily.   glucagon 1 MG injection Commonly known as:  GLUCAGON EMERGENCY Inject 1 mg into the muscle once as needed (For very low blood sugars. May repeat in 15-20 minutes if needed.).   insulin glargine 100 UNIT/ML injection Commonly known as:  LANTUS Inject 0.25 mLs (25 Units total) into the skin daily. What changed:  how much to take   metoCLOPramide 5 MG tablet Commonly known as:  REGLAN Take 1 tablet (5 mg total) by mouth 3 (three) times daily before meals. What changed:    medication strength  how much to take  when to take this   metoprolol tartrate 25 MG tablet Commonly known as:  LOPRESSOR Take 0.5 tablets (12.5 mg total) by mouth 2 (two) times daily.    NOVOLOG FLEXPEN 100 UNIT/ML FlexPen Generic drug:  insulin aspart Inject 1-10 Units into the skin 3 (three) times daily with meals. Pt uses 1 unit for every 10 grams of carbs with meals and snacks for BS greater than 50. Sliding scale 1 units/50 when CBG > 150   ondansetron 4 MG tablet Commonly known as:  ZOFRAN Take 1 tablet (4 mg total) by mouth every 8 (eight) hours as needed for nausea, vomiting or refractory nausea / vomiting. What changed:  reasons to take this   oxybutynin 5 MG tablet Commonly known as:  DITROPAN Take 0.5 tablets (2.5 mg total) by mouth 3 (three) times daily.   RESTASIS MULTIDOSE 0.05 % ophthalmic emulsion Generic drug:  cycloSPORINE Place 1 drop into both eyes every 12 (twelve) hours.   Vitamin D (Ergocalciferol) 50000 units Caps capsule Commonly known as:  DRISDOL Take 50,000 Units by mouth once  a week. On fridays      Allergies  Allergen Reactions  . Peanut-Containing Drug Products Swelling and Other (See Comments)    Reaction:  Swelling of mouth and lips   . 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   Reaction:  Swelling of mouth and lips   . Ultram [Tramadol] Itching   Follow-up Information    Vicenta Aly, FNP. Schedule an appointment as soon as possible for a visit in 3 day(s).   Specialty:  Nurse Practitioner Contact information: [place] [address] [phone]        Gerome Apley, MD. Schedule an appointment as soon as possible for a visit in 1 week(s).   Specialty:  Endocrinology Contact information: [address] [place] [address] [phone]            The results of significant diagnostics from this hospitalization (including imaging, microbiology, ancillary and laboratory) are listed below for reference.    Significant Diagnostic Studies: US Renal  Result Date: 01/25/2017 CLINICAL DATA:  Acute renal failure.  Urinary  retention. EXAM: RENAL / URINARY TRACT ULTRASOUND COMPLETE COMPARISON:  Abdominal CT 10/23/2016 FINDINGS: Right Kidney: Length: 11.3 cm. Mild caliceal distention without frank hydronephrosis. Echogenicity within normal limits. No mass visualized. Left Kidney: Length: 11.5 cm. Echogenicity within normal limits. No mass or hydronephrosis visualized. Bladder: Foley catheter in the urinary bladder. Intravesicular debris with bladder wall thickening. IMPRESSION: 1. Bladder wall thickening with intravesicular debris suggesting inflammation or infection. Foley catheter in place. 2. Mild right caliceal prominence. No frank hydronephrosis. Normal sonographic appearance of the left kidney. Electronically Signed   By: Jeb Levering M.D.   On: 01/25/2017 02:15    Microbiology: Recent Results (from the past 240 hour(s))  Culture, Urine     Status: Abnormal   Collection Time: 01/28/17  8:42 AM  Result Value Ref Range Status   Specimen Description URINE, CLEAN CATCH  Final   Special Requests NONE  Final   Culture (A)  Final    >=100,000 COLONIES/mL LACTOBACILLUS SPECIES Standardized susceptibility testing for this organism is not available.    Report Status 01/29/2017 FINAL  Final     Labs: Basic Metabolic Panel: Recent Labs  Lab 01/27/17 0033 01/27/17 0715  01/27/17 1435 01/27/17 2102 01/28/17 0022 01/28/17 0430 01/28/17 0840 01/28/17 1142 01/29/17 0528  NA 134* 135  --  137  --   --   --   --  133* 134*  K 3.9 3.0*  --  2.6*  --   --   --   --  3.0* 5.3*  CL 117* 114*  --  115*  --   --   --   --  105 106  CO2 9* 14*  --  15*  --   --   --   --  18* 17*  GLUCOSE 127* 179*  --  152*  --   --   --   --  203* 331*  BUN 18 11  --  6  --   --   --   --  <5* <5*  CREATININE 0.75 0.64  --  0.49  --   --  0.57  --  0.42* 0.53  CALCIUM 7.4* 8.2*  --  8.0*  --   --   --   --  7.8* 8.2*  MG 1.5* 1.4*   < >  --  1.3* 1.3*  1.4* 1.8 1.6*  --   PHOS 1.2* 1.2*   < >  --  <1.0* <1.0* <1.0* 1.4* 1.0*  --     < > = values in this interval not displayed.   Liver Function Tests: No results for input(s): AST, ALT, ALKPHOS, BILITOT, PROT, ALBUMIN in the last 168 hours. No results for input(s): LIPASE, AMYLASE in the last 168 hours. No results for input(s): AMMONIA in the last 168 hours. CBC: Recent Labs  Lab 01/24/17 0228  WBC 9.6  HGB 14.4  HCT 44.7  MCV 90.1  PLT 282   Cardiac Enzymes: No results for input(s): CKTOTAL, CKMB, CKMBINDEX, TROPONINI in the last 168 hours. BNP: BNP (last 3 results) No results for input(s): BNP in the last 8760 hours.  ProBNP (last 3 results) No results for input(s): PROBNP in the last 8760 hours.  CBG: Recent Labs  Lab 01/28/17 1150 01/28/17 1654 01/28/17 2122 01/29/17 0753 01/29/17 1159  GLUCAP 188* 241* 251* 319* 249*       Signed:  Domenic Polite MD.  [facility] 01/29/2017, 3:40 PM

## 2017-01-29 NOTE — Progress Notes (Addendum)
Patient was discharged home by MD order; discharged instructions  review and give to patient with care notes and prescriptions; IV DIC; midline D/C; patient refused to be escorted to the car by staff; she walk to the font door.

## 2017-02-02 ENCOUNTER — Other Ambulatory Visit: Payer: Self-pay

## 2017-02-02 ENCOUNTER — Encounter (HOSPITAL_COMMUNITY): Payer: Self-pay | Admitting: Emergency Medicine

## 2017-02-02 ENCOUNTER — Emergency Department (HOSPITAL_COMMUNITY)
Admission: EM | Admit: 2017-02-02 | Discharge: 2017-02-03 | Disposition: A | Discharge status: Home / Self Care | Payer: Medicaid Other | Attending: Emergency Medicine | Admitting: Emergency Medicine

## 2017-02-02 DIAGNOSIS — Z91010 Allergy to peanuts: Secondary | ICD-10-CM | POA: Diagnosis not present

## 2017-02-02 DIAGNOSIS — G43A1 Cyclical vomiting, intractable: Secondary | ICD-10-CM | POA: Diagnosis not present

## 2017-02-02 DIAGNOSIS — E109 Type 1 diabetes mellitus without complications: Secondary | ICD-10-CM | POA: Insufficient documentation

## 2017-02-02 DIAGNOSIS — I5032 Chronic diastolic (congestive) heart failure: Secondary | ICD-10-CM | POA: Insufficient documentation

## 2017-02-02 DIAGNOSIS — R109 Unspecified abdominal pain: Secondary | ICD-10-CM | POA: Diagnosis not present

## 2017-02-02 DIAGNOSIS — J45909 Unspecified asthma, uncomplicated: Secondary | ICD-10-CM | POA: Diagnosis not present

## 2017-02-02 DIAGNOSIS — Z794 Long term (current) use of insulin: Secondary | ICD-10-CM | POA: Insufficient documentation

## 2017-02-02 DIAGNOSIS — Z79899 Other long term (current) drug therapy: Secondary | ICD-10-CM | POA: Diagnosis not present

## 2017-02-02 DIAGNOSIS — I110 Hypertensive heart disease with heart failure: Secondary | ICD-10-CM | POA: Diagnosis not present

## 2017-02-02 DIAGNOSIS — R1115 Cyclical vomiting syndrome unrelated to migraine: Secondary | ICD-10-CM

## 2017-02-02 DIAGNOSIS — R112 Nausea with vomiting, unspecified: Secondary | ICD-10-CM | POA: Diagnosis present

## 2017-02-02 LAB — URINALYSIS, ROUTINE W REFLEX MICROSCOPIC
Bilirubin Urine: NEGATIVE
Ketones, ur: 20 mg/dL — AB
NITRITE: NEGATIVE
PH: 9 — AB (ref 5.0–8.0)
Protein, ur: 30 mg/dL — AB
SPECIFIC GRAVITY, URINE: 1.007 (ref 1.005–1.030)

## 2017-02-02 LAB — COMPREHENSIVE METABOLIC PANEL
ALBUMIN: 3.4 g/dL — AB (ref 3.5–5.0)
ALK PHOS: 101 U/L (ref 38–126)
ALT: 11 U/L — ABNORMAL LOW (ref 14–54)
ANION GAP: 14 (ref 5–15)
AST: 52 U/L — ABNORMAL HIGH (ref 15–41)
BILIRUBIN TOTAL: 1.6 mg/dL — AB (ref 0.3–1.2)
BUN: 7 mg/dL (ref 6–20)
CALCIUM: 9.1 mg/dL (ref 8.9–10.3)
CO2: 22 mmol/L (ref 22–32)
Chloride: 101 mmol/L (ref 101–111)
Creatinine, Ser: 0.45 mg/dL (ref 0.44–1.00)
GFR calc Af Amer: >60 mL/min (ref >60)
Glucose, Bld: 128 mg/dL — ABNORMAL HIGH (ref 65–99)
Potassium: 4.7 mmol/L (ref 3.5–5.1)
Sodium: 137 mmol/L (ref 135–145)
TOTAL PROTEIN: 6.5 g/dL (ref 6.5–8.1)

## 2017-02-02 LAB — I-STAT BETA HCG BLOOD, ED (MC, WL, AP ONLY): I-stat hCG, quantitative: <5 m[IU]/mL (ref <5)

## 2017-02-02 LAB — CBC
HCT: 36.6 % (ref 36.0–46.0)
HEMOGLOBIN: 12 g/dL (ref 12.0–15.0)
MCH: 28 pg (ref 26.0–34.0)
MCHC: 32.8 g/dL (ref 30.0–36.0)
MCV: 85.5 fL (ref 78.0–100.0)
Platelets: 473 10*3/uL — ABNORMAL HIGH (ref 150–400)
RBC: 4.28 MIL/uL (ref 3.87–5.11)
RDW: 14.3 % (ref 11.5–15.5)
WBC: 12.1 10*3/uL — AB (ref 4.0–10.5)

## 2017-02-02 LAB — LIPASE, BLOOD: Lipase: 22 U/L (ref 11–51)

## 2017-02-02 MED ORDER — SODIUM CHLORIDE 0.9 % IV BOLUS (SEPSIS)
1000.0000 mL | Freq: Once | INTRAVENOUS | Status: AC
  Administered 2017-02-02: 1000 mL via INTRAVENOUS

## 2017-02-02 MED ORDER — PROMETHAZINE HCL 25 MG PO TABS: 25.0000 mg | ORAL_TABLET | Freq: Four times a day (QID) | ORAL | 0 refills | Status: DC | PRN

## 2017-02-02 MED ORDER — ONDANSETRON 4 MG PO TBDP
4.0000 mg | ORAL_TABLET | Freq: Once | ORAL | Status: AC
  Administered 2017-02-02: 4 mg via ORAL
  Filled 2017-02-02: qty 1

## 2017-02-02 MED ORDER — METOCLOPRAMIDE HCL 5 MG/ML IJ SOLN
10.0000 mg | Freq: Once | INTRAMUSCULAR | Status: AC
  Administered 2017-02-02: 10 mg via INTRAVENOUS
  Filled 2017-02-02: qty 2

## 2017-02-02 MED ORDER — MORPHINE SULFATE (PF) 4 MG/ML IV SOLN
4.0000 mg | Freq: Once | INTRAVENOUS | Status: AC
  Administered 2017-02-02: 4 mg via INTRAVENOUS
  Filled 2017-02-02: qty 1

## 2017-02-02 MED ORDER — PROMETHAZINE HCL 25 MG/ML IJ SOLN
12.5000 mg | Freq: Once | INTRAMUSCULAR | Status: AC
  Administered 2017-02-02: 12.5 mg via INTRAVENOUS
  Filled 2017-02-02: qty 1

## 2017-02-02 MED ORDER — LORAZEPAM 2 MG/ML IJ SOLN: 0.5000 mg | Freq: Once | INTRAMUSCULAR | Status: DC

## 2017-02-02 MED ORDER — GI COCKTAIL ~~LOC~~
30.0000 mL | Freq: Once | ORAL | Status: AC
  Administered 2017-02-02: 30 mL via ORAL
  Filled 2017-02-02: qty 30

## 2017-02-02 MED ORDER — PROMETHAZINE HCL 25 MG RE SUPP: 25.0000 mg | Freq: Four times a day (QID) | RECTAL | 0 refills | Status: DC | PRN

## 2017-02-02 NOTE — ED Notes (Signed)
Patient provided with water for fluid challenge. Tolerating sips at this time.

## 2017-02-02 NOTE — ED Triage Notes (Signed)
Pt.l stated, I have gastro poiesis, I started this morning with n/v STOAMCH PAIN . [place] ON Friday.

## 2017-02-02 NOTE — Discharge Instructions (Signed)
Take phenergan for nausea as prescribed. Do not take both a pill and a suppository at the same time. Follow up with family doctor.

## 2017-02-02 NOTE — ED Provider Notes (Signed)
[facility] EMERGENCY DEPARTMENT Provider Note   CSN: [id number] Arrival date & time: 02/02/17  1712     History   Chief Complaint Chief Complaint  Patient presents with  . Abdominal Pain  . Emesis  . Nausea    HPI [name] is a 27 y.o. female.  HPI [name] is a 27 y.o. female with history of anxiety, type 1 diabetes, gastroparesis, acid reflux, presents to emergency department complaining of nausea and vomiting and abdominal pain.  Patient symptoms are similar to her prior gastroparesis and cyclical vomiting symptoms.  In fact she just got out of the hospital 4 days ago for the same.  She states she was doing well up until this morning.  She tried taking her Reglan but unable to keep it down.  She states her abdominal pain is diffuse.  Denies any urinary symptoms.  No flulike symptoms.  Nothing making her symptoms better or worse.  Past Medical History:  Diagnosis Date  . Anxiety   . Arthritis    "hands, feet, knees" (12/18/2016)  . Asthma   . Diabetic gastroparesis ([place])    Per gastric emptying study 07/09/16 which showed significant delayed gastric emptying.  . Gallstones   . Gastroparesis   . GERD (gastroesophageal reflux disease)   . Heart murmur   . Hepatic steatosis 11/26/2014   and hepatomegaly  . Hypertension    hx (12/18/2016)  . Intractable cyclical vomiting syndrome    Archie Endo 12/18/2016  . Liver mass 11/26/2014  . Pancreatitis, acute 11/26/2014  . Pneumonia    "as a teen X 1" (12/18/2016)  . Type I diabetes mellitus ([place]) 2007   IDDM.  poorly controlled, multiple admits with DKA    Patient Active Problem List   Diagnosis Date Noted  . Nausea and vomiting 01/22/2017  . Nausea & vomiting 01/21/2017  . Intractable cyclical vomiting syndrome 12/18/2016  . Leukocytosis 10/24/2016  . N&V (nausea and vomiting) 10/23/2016  . DKA, type 1 ([place]) 09/26/2016  . Thrombocytosis ([place]) 07/08/2016  . Candiduria, asymptomatic  06/23/2016  . Hyponatremia 06/13/2016  . Hematuria 05/13/2016  . UTI (urinary tract infection) 01/22/2016  . Diarrhea 11/09/2015  . Acute urinary retention   . GERD (gastroesophageal reflux disease) 07/10/2015  . Depression with anxiety 07/10/2015  . Gastroparesis 06/22/2015  . Altered mental status 06/22/2015  . Volume depletion 06/10/2015  . Protein-calorie malnutrition, severe 06/10/2015  . Hyperglycemia   . Elevated bilirubin   . Hematemesis with nausea   . Intractable nausea and vomiting 05/28/2015  . Abdominal pain in female   . Abdominal pain 05/24/2015  . Hypertension 05/24/2015  . Dehydration   . Diabetic gastroparesis ([place])   . Chronic diastolic heart failure ([place]) 04/11/2015  . Hematemesis 04/08/2015  . DKA (diabetic ketoacidoses) ([place]) 03/22/2015  . S/P laparoscopic cholecystectomy 02/11/2015  . Postextubation stridor   . Pancreatitis, acute 11/26/2014  . Volume overload 11/26/2014  . Hypokalemia 11/26/2014  . Hepatic steatosis 11/26/2014  . Liver mass 11/26/2014  . Sepsis ([place]) 11/25/2014  . Sinus tachycardia 11/25/2014  . Hypomagnesemia 11/25/2014  . Hypophosphatemia 11/25/2014  . Elevated LFTs 11/24/2014  . AKI (acute kidney injury) ([place]) 11/24/2014  . Migraine headache 11/24/2014  . Asthma 06/29/2012  . Uncontrolled type 1 diabetes mellitus ([place]) 06/19/2010  . Goiter, unspecified 06/19/2010    Past Surgical History:  Procedure Laterality Date  . CHOLECYSTECTOMY N/A 02/11/2015   Procedure: LAPAROSCOPIC CHOLECYSTECTOMY WITH INTRAOPERATIVE CHOLANGIOGRAM;  Surgeon: Greer Pickerel, MD;  Location:  [facility] ORS;  Service: General;  Laterality: N/A;  . ESOPHAGOGASTRODUODENOSCOPY (EGD) WITH PROPOFOL Left 09/20/2014   Procedure: ESOPHAGOGASTRODUODENOSCOPY (EGD) WITH PROPOFOL;  Surgeon: Arta Silence, MD;  [facility] END[facility]COPY;  Service: Endoscopy;  Laterality: Left;  . WISDOM TOOTH EXTRACTION      OB History    Gravida Para Term Preterm AB Living   2 1 0 1 1 1     SAB TAB Ectopic Multiple Live Births   0 1 0 0 1       Home Medications    Prior to Admission medications   Medication Sig Start Date End Date Taking? Authorizing Provider  albuterol (PROVENTIL HFA;VENTOLIN HFA) 108 (90 Base) MCG/ACT inhaler Inhale 1-2 puffs into the lungs every 6 (six) hours as needed for wheezing or shortness of breath.   Yes [provider]  cephALEXin (KEFLEX) 250 MG capsule Take 1 capsule (250 mg total) by mouth 3 (three) times daily. For 3days 01/29/17  Yes Domenic Polite, MD  dicyclomine (BENTYL) 20 MG tablet Take 1 tablet (20 mg total) by mouth 2 (two) times daily. 07/13/16  Yes Volanda Napoleon, PA-C  glucagon (GLUCAGON EMERGENCY) 1 MG injection Inject 1 mg into the muscle once as needed (For very low blood sugars. May repeat in 15-20 minutes if needed.). 12/20/16  Yes Hongalgi, Lenis Dickinson, MD  insulin aspart (NOVOLOG FLEXPEN) 100 UNIT/ML FlexPen Inject 1-10 Units into the skin 3 (three) times daily with meals. Pt uses 1 unit for every 10 grams of carbs with meals and snacks for BS greater than 50. Sliding scale 1 units/50 when CBG > 150   Yes [provider]  insulin glargine (LANTUS) 100 UNIT/ML injection Inject 0.25 mLs (25 Units total) into the skin daily. Patient taking differently: Inject 25 Units into the skin at bedtime.  01/26/17  Yes Dessa Phi, DO  metoCLOPramide (REGLAN) 5 MG tablet Take 1 tablet (5 mg total) by mouth 3 (three) times daily before meals. 01/29/17  Yes Domenic Polite, MD  metoprolol tartrate (LOPRESSOR) 25 MG tablet Take 0.5 tablets (12.5 mg total) by mouth 2 (two) times daily. 05/10/16  Yes Mariel Aloe, MD  ondansetron (ZOFRAN) 4 MG tablet Take 1 tablet (4 mg total) by mouth every 8 (eight) hours as needed for nausea, vomiting or refractory nausea / vomiting. 01/26/17  Yes Dessa Phi, DO  oxybutynin (DITROPAN) 5 MG tablet Take 0.5 tablets (2.5 mg total) by mouth 3 (three) times daily. 10/29/16  Yes Donne Hazel, MD    RESTASIS MULTIDOSE 0.05 % ophthalmic emulsion Place 1 drop into both eyes every 12 (twelve) hours. 09/08/16  Yes [provider]  Vitamin D, Ergocalciferol, (DRISDOL) 50000 units CAPS capsule Take 50,000 Units by mouth once a week. On fridays 05/26/16  Yes [provider]    Family History Family History  Problem Relation Age of Onset  . Heart disease Maternal Grandmother   . Heart disease Maternal Grandfather   . Diabetes Mother   . Hyperlipidemia Mother   . Hypertension Father   . Heart disease Father   . Hypertension Paternal Grandmother   . Cancer Paternal Grandfather     Social History Social History   Tobacco Use  . Smoking status: Never Smoker  . Smokeless tobacco: Never Used  Substance Use Topics  . Alcohol use: No  . Drug use: Yes    Types: Marijuana    Comment: 12/18/2016 q few days"     Allergies   Peanut-containing drug products; Food; and  Ultram [tramadol]   Review of Systems Review of Systems  Constitutional: Negative for chills and fever.  Respiratory: Negative for cough, chest tightness and shortness of breath.   Cardiovascular: Negative for chest pain, palpitations and leg swelling.  Gastrointestinal: Positive for abdominal pain, nausea and vomiting. Negative for diarrhea.  Genitourinary: Negative for dysuria, flank pain and pelvic pain.  Musculoskeletal: Negative for arthralgias, myalgias, neck pain and neck stiffness.  Skin: Negative for rash.  Neurological: Negative for dizziness, weakness and headaches.  All other systems reviewed and are negative.    Physical Exam Updated Vital Signs BP (!) 155/115 (BP Location: Left Arm)   Pulse (!) 112   Temp 98.7 F (37.1 C) (Oral)   Resp 18   Ht 5\' 4"  (1.626 m)   Wt 52.6 kg (116 lb)   LMP 01/12/2017   SpO2 100%   BMI 19.91 kg/m   Physical Exam  Constitutional: She appears well-developed and well-nourished.  Patient is walking around in bed, moaning, actively vomiting  HENT:   Head: Normocephalic.  Eyes: Conjunctivae and EOM are normal. Pupils are equal, round, and reactive to light.  Neck: Neck supple.  Cardiovascular: Normal rate, regular rhythm and normal heart sounds.  Pulmonary/Chest: Effort normal and breath sounds normal. No respiratory distress. She has no wheezes. She has no rales.  Abdominal: Soft. Bowel sounds are normal. She exhibits no distension. There is tenderness. There is no rebound.  Diffuse tenderness  Musculoskeletal: She exhibits no edema.  Neurological: She is alert.  Skin: Skin is warm and dry.  Psychiatric: She has a normal mood and affect. Her behavior is normal.  Nursing note and vitals reviewed.    ED Treatments / Results  Labs (all labs ordered are listed, but only abnormal results are displayed) Labs Reviewed  COMPREHENSIVE METABOLIC PANEL - Abnormal; Notable for the following components:      Result Value   Glucose, Bld 128 (*)    Albumin 3.4 (*)    AST 52 (*)    ALT 11 (*)    Total Bilirubin 1.6 (*)    All other components within normal limits  CBC - Abnormal; Notable for the following components:   WBC 12.1 (*)    Platelets 473 (*)    All other components within normal limits  LIPASE, BLOOD  URINALYSIS, ROUTINE W REFLEX MICROSCOPIC  I-STAT BETA HCG BLOOD, ED ([facility], [facility], [facility] ONLY)    EKG  EKG Interpretation None       Radiology No results found.  Procedures Procedures (including critical care time)  Medications Ordered in ED Medications  metoCLOPramide (REGLAN) injection 10 mg (not administered)  ondansetron (ZOFRAN-ODT) disintegrating tablet 4 mg (4 mg Oral Given 02/02/17 1740)  morphine 4 MG/ML injection 4 mg (4 mg Intravenous Given 02/02/17 2041)  promethazine (PHENERGAN) injection 12.5 mg (12.5 mg Intravenous Given 02/02/17 2041)  sodium chloride 0.9 % bolus 1,000 mL (1,000 mLs Intravenous New Bag/Given 02/02/17 2042)     Initial Impression / Assessment and Plan / ED Course  I have reviewed the  triage vital signs and the nursing notes.  Pertinent labs & imaging results that were available during my care of the patient were reviewed by me and considered in my medical decision making (see chart for details).     Patient in emergency department with recurrent nausea, vomiting, abdominal pain.  Patient has severe gastroparesis and she is a fragile type I diabetic.  Today her glucose is 128.  Electrolytes all normal.  Anion  gap is 14.  Does not appear to be in DKA at this time.  Not pregnant.  Will treat with IV fluids and medications.  9:26 PM Patient reassessed, she states her pain is slightly improved, nausea improved as well but she just had another episode of emesis she is tachycardic, normal vitals otherwise.  I will continue to give her fluids and give her Reglan.  10:43 PM Patient is feeling a little better.  Sipping on some fluids.  11:46 PM Pt feeling better. Tolerating fluids. Urine showing TNTC WBC, which is a constant finding in her UA. She has no symptoms, other than emesis. WIll send cultures.  Abdomen is soft, nontender at this time.  I do not think she needs any imaging on emergent basis. most likely exacerbation of her gastroparesis.  Pt is comfortable trying to go home. Will give prescription for phenergan. Follow up with pcp. Return precuations discussed.   Vitals:   02/02/17 2200 02/02/17 2230 02/02/17 2245 02/02/17 2316  BP: 129/78 112/65 106/71 127/87  Pulse: (!) 107 (!) 102 (!) 103 (!) 104  Resp:    16  Temp:    98.7 F (37.1 C)  TempSrc:    Oral  SpO2: 100% 99% 99% 99%  Weight:      Height:         Final Clinical Impressions(s) / ED Diagnoses   Final diagnoses:  Intractable cyclical vomiting with nausea    ED Discharge Orders        Ordered    promethazine (PHENERGAN) 25 MG tablet  Every 6 hours PRN     02/02/17 2342    promethazine (PHENERGAN) 25 MG suppository  Every 6 hours PRN     02/02/17 2342       Jeannett Senior, PA-C 02/02/17  2347    Dorie Rank, MD 02/03/17 [phone]

## 2017-02-03 NOTE — ED Notes (Signed)
Pt st's unable to void at this time

## 2017-02-04 LAB — URINE CULTURE: Culture: NO GROWTH

## 2017-06-10 ENCOUNTER — Emergency Department (HOSPITAL_COMMUNITY)
Admission: EM | Admit: 2017-06-10 | Discharge: 2017-06-10 | Disposition: A | Discharge status: Home / Self Care | Payer: Medicaid Other | Attending: Emergency Medicine | Admitting: Emergency Medicine

## 2017-06-10 ENCOUNTER — Emergency Department (HOSPITAL_COMMUNITY): Payer: Medicaid Other

## 2017-06-10 ENCOUNTER — Encounter (HOSPITAL_COMMUNITY): Payer: Self-pay | Admitting: Emergency Medicine

## 2017-06-10 DIAGNOSIS — N390 Urinary tract infection, site not specified: Secondary | ICD-10-CM | POA: Insufficient documentation

## 2017-06-10 DIAGNOSIS — I5032 Chronic diastolic (congestive) heart failure: Secondary | ICD-10-CM | POA: Insufficient documentation

## 2017-06-10 DIAGNOSIS — E1043 Type 1 diabetes mellitus with diabetic autonomic (poly)neuropathy: Secondary | ICD-10-CM | POA: Diagnosis not present

## 2017-06-10 DIAGNOSIS — Z794 Long term (current) use of insulin: Secondary | ICD-10-CM | POA: Insufficient documentation

## 2017-06-10 DIAGNOSIS — Z79899 Other long term (current) drug therapy: Secondary | ICD-10-CM | POA: Diagnosis not present

## 2017-06-10 DIAGNOSIS — E1010 Type 1 diabetes mellitus with ketoacidosis without coma: Secondary | ICD-10-CM | POA: Insufficient documentation

## 2017-06-10 DIAGNOSIS — I110 Hypertensive heart disease with heart failure: Secondary | ICD-10-CM | POA: Insufficient documentation

## 2017-06-10 DIAGNOSIS — J45909 Unspecified asthma, uncomplicated: Secondary | ICD-10-CM | POA: Insufficient documentation

## 2017-06-10 DIAGNOSIS — R310 Gross hematuria: Secondary | ICD-10-CM | POA: Diagnosis present

## 2017-06-10 LAB — URINALYSIS, ROUTINE W REFLEX MICROSCOPIC
BACTERIA UA: NONE SEEN
BILIRUBIN URINE: NEGATIVE
Glucose, UA: >=500 mg/dL — AB
Ketones, ur: NEGATIVE mg/dL
Nitrite: NEGATIVE
Protein, ur: 100 mg/dL — AB
RBC / HPF: >50 RBC/hpf — ABNORMAL HIGH (ref 0–5)
Specific Gravity, Urine: 1.031 — ABNORMAL HIGH (ref 1.005–1.030)
pH: 6 (ref 5.0–8.0)

## 2017-06-10 LAB — COMPREHENSIVE METABOLIC PANEL
ALK PHOS: 86 U/L (ref 38–126)
ALT: 11 U/L — AB (ref 14–54)
AST: 14 U/L — ABNORMAL LOW (ref 15–41)
Albumin: 3.4 g/dL — ABNORMAL LOW (ref 3.5–5.0)
Anion gap: 8 (ref 5–15)
BUN: 14 mg/dL (ref 6–20)
CALCIUM: 9.4 mg/dL (ref 8.9–10.3)
CO2: 28 mmol/L (ref 22–32)
CREATININE: 0.57 mg/dL (ref 0.44–1.00)
Chloride: 107 mmol/L (ref 101–111)
GFR calc non Af Amer: >60 mL/min (ref >60)
GLUCOSE: 63 mg/dL — AB (ref 65–99)
Potassium: 3.8 mmol/L (ref 3.5–5.1)
SODIUM: 143 mmol/L (ref 135–145)
Total Bilirubin: 0.1 mg/dL — ABNORMAL LOW (ref 0.3–1.2)
Total Protein: 6.7 g/dL (ref 6.5–8.1)

## 2017-06-10 LAB — POC URINE PREG, ED: Preg Test, Ur: NEGATIVE

## 2017-06-10 MED ORDER — CEPHALEXIN 500 MG PO CAPS
500.0000 mg | ORAL_CAPSULE | Freq: Once | ORAL | Status: AC
  Administered 2017-06-10: 500 mg via ORAL
  Filled 2017-06-10: qty 1

## 2017-06-10 MED ORDER — CEPHALEXIN 500 MG PO CAPS: 500.0000 mg | ORAL_CAPSULE | Freq: Four times a day (QID) | ORAL | 0 refills | Status: DC

## 2017-06-10 NOTE — ED Provider Notes (Signed)
[facility] DEPT Provider Note   CSN: [id number] Arrival date & time: 06/10/17  1823     History   Chief Complaint Chief Complaint  Patient presents with  . Hematuria    HPI [name] is a 27 y.o. female.  Patient is a 27 year old female with a history of type 1 diabetes who is currently on an insulin pump, gastroparesis, asthma, hypertension who is presenting today with hematuria and flank pain.  Patient states for the last month she has had bilateral flank pain and intermittent hematuria sometimes with clots.  She has frequency and urgency but no true dysuria.  She was seen by her PCP 2 weeks ago and had a culture done but states the culture came back negative so she was never treated.  Patient was also having this issue in January and was seen and had a ultrasound done that showed evidence of inflammation of the bladder wall but no hydronephrosis.  At that time patient had a Foley catheter and a UTI which was treated.  Patient denies any vaginal symptoms and has been sexually active for over a year.  LMP was within the last month.  She denies any fever, nausea, vomiting or lower abdominal pain.  She does not take anticoagulation.  The history is provided by the patient.  Hematuria  This is a recurrent problem. Episode onset: 1 month. The problem occurs constantly. The problem has been gradually worsening. Associated symptoms comments: Bilateral flank pain, polyuria and frequency but no dysuria.  No vaginal sx and pt has not been sexually active for over a year. Nothing aggravates the symptoms. Nothing relieves the symptoms. The treatment provided no relief.    Past Medical History:  Diagnosis Date  . Anxiety   . Arthritis    "hands, feet, knees" (12/18/2016)  . Asthma   . Diabetic gastroparesis ([place])    Per gastric emptying study 07/09/16 which showed significant delayed gastric emptying.  . Gallstones   . Gastroparesis   . GERD  (gastroesophageal reflux disease)   . Heart murmur   . Hepatic steatosis 11/26/2014   and hepatomegaly  . Hypertension    hx (12/18/2016)  . Intractable cyclical vomiting syndrome    Archie Endo 12/18/2016  . Liver mass 11/26/2014  . Pancreatitis, acute 11/26/2014  . Pneumonia    "as a teen X 1" (12/18/2016)  . Type I diabetes mellitus ([place]) 2007   IDDM.  poorly controlled, multiple admits with DKA    Patient Active Problem List   Diagnosis Date Noted  . Nausea and vomiting 01/22/2017  . Nausea & vomiting 01/21/2017  . Intractable cyclical vomiting syndrome 12/18/2016  . Leukocytosis 10/24/2016  . N&V (nausea and vomiting) 10/23/2016  . DKA, type 1 ([place]) 09/26/2016  . Thrombocytosis ([place]) 07/08/2016  . Candiduria, asymptomatic 06/23/2016  . Hyponatremia 06/13/2016  . Hematuria 05/13/2016  . UTI (urinary tract infection) 01/22/2016  . Diarrhea 11/09/2015  . Acute urinary retention   . GERD (gastroesophageal reflux disease) 07/10/2015  . Depression with anxiety 07/10/2015  . Gastroparesis 06/22/2015  . Altered mental status 06/22/2015  . Volume depletion 06/10/2015  . Protein-calorie malnutrition, severe 06/10/2015  . Hyperglycemia   . Elevated bilirubin   . Hematemesis with nausea   . Intractable nausea and vomiting 05/28/2015  . Abdominal pain in female   . Abdominal pain 05/24/2015  . Hypertension 05/24/2015  . Dehydration   . Diabetic gastroparesis ([place])   . Chronic diastolic heart failure ([place]) 04/11/2015  . Hematemesis  04/08/2015  . DKA (diabetic ketoacidoses) ([place]) 03/22/2015  . S/P laparoscopic cholecystectomy 02/11/2015  . Postextubation stridor   . Pancreatitis, acute 11/26/2014  . Volume overload 11/26/2014  . Hypokalemia 11/26/2014  . Hepatic steatosis 11/26/2014  . Liver mass 11/26/2014  . Sepsis ([place]) 11/25/2014  . Sinus tachycardia 11/25/2014  . Hypomagnesemia 11/25/2014  . Hypophosphatemia 11/25/2014  . Elevated LFTs 11/24/2014  . AKI (acute kidney  injury) ([place]) 11/24/2014  . Migraine headache 11/24/2014  . Asthma 06/29/2012  . Uncontrolled type 1 diabetes mellitus ([place]) 06/19/2010  . Goiter, unspecified 06/19/2010    Past Surgical History:  Procedure Laterality Date  . CHOLECYSTECTOMY N/A 02/11/2015   Procedure: LAPAROSCOPIC CHOLECYSTECTOMY WITH INTRAOPERATIVE CHOLANGIOGRAM;  Surgeon: Greer Pickerel, MD;  Location: [facility] ORS;  Service: General;  Laterality: N/A;  . ESOPHAGOGASTRODUODENOSCOPY (EGD) WITH PROPOFOL Left 09/20/2014   Procedure: ESOPHAGOGASTRODUODENOSCOPY (EGD) WITH PROPOFOL;  Surgeon: Arta Silence, MD;  Location: [facility] ENDOSCOPY;  Service: Endoscopy;  Laterality: Left;  . WISDOM TOOTH EXTRACTION       OB History    Gravida  2   Para  1   Term  0   Preterm  1   AB  1   Living  1     SAB  0   TAB  1   Ectopic  0   Multiple  0   Live Births  1            Home Medications    Prior to Admission medications   Medication Sig Start Date End Date Taking? Authorizing Provider  albuterol (PROVENTIL HFA;VENTOLIN HFA) 108 (90 Base) MCG/ACT inhaler Inhale 1-2 puffs into the lungs every 6 (six) hours as needed for wheezing or shortness of breath.    [provider]  cephALEXin (KEFLEX) 250 MG capsule Take 1 capsule (250 mg total) by mouth 3 (three) times daily. For 3days 01/29/17   Domenic Polite, MD  dicyclomine (BENTYL) 20 MG tablet Take 1 tablet (20 mg total) by mouth 2 (two) times daily. 07/13/16   Volanda Napoleon, PA-C  glucagon (GLUCAGON EMERGENCY) 1 MG injection Inject 1 mg into the muscle once as needed (For very low blood sugars. May repeat in 15-20 minutes if needed.). 12/20/16   Hongalgi, Lenis Dickinson, MD  insulin aspart (NOVOLOG FLEXPEN) 100 UNIT/ML FlexPen Inject 1-10 Units into the skin 3 (three) times daily with meals. Pt uses 1 unit for every 10 grams of carbs with meals and snacks for BS greater than 50. Sliding scale 1 units/50 when CBG > 150    [provider]  insulin glargine  (LANTUS) 100 UNIT/ML injection Inject 0.25 mLs (25 Units total) into the skin daily. Patient taking differently: Inject 25 Units into the skin at bedtime.  01/26/17   Dessa Phi, DO  metoCLOPramide (REGLAN) 5 MG tablet Take 1 tablet (5 mg total) by mouth 3 (three) times daily before meals. 01/29/17   Domenic Polite, MD  metoprolol tartrate (LOPRESSOR) 25 MG tablet Take 0.5 tablets (12.5 mg total) by mouth 2 (two) times daily. 05/10/16   Mariel Aloe, MD  ondansetron (ZOFRAN) 4 MG tablet Take 1 tablet (4 mg total) by mouth every 8 (eight) hours as needed for nausea, vomiting or refractory nausea / vomiting. 01/26/17   Dessa Phi, DO  oxybutynin (DITROPAN) 5 MG tablet Take 0.5 tablets (2.5 mg total) by mouth 3 (three) times daily. 10/29/16   Donne Hazel, MD  promethazine (PHENERGAN) 25 MG suppository Place 1 suppository (25 mg  total) rectally every 6 (six) hours as needed for nausea or vomiting. 02/02/17   Kirichenko, Lahoma Rocker, PA-C  promethazine (PHENERGAN) 25 MG tablet Take 1 tablet (25 mg total) by mouth every 6 (six) hours as needed for nausea or vomiting. 02/02/17   Kirichenko, Tatyana, PA-C  RESTASIS MULTIDOSE 0.05 % ophthalmic emulsion Place 1 drop into both eyes every 12 (twelve) hours. 09/08/16   [provider]  Vitamin D, Ergocalciferol, (DRISDOL) 50000 units CAPS capsule Take 50,000 Units by mouth once a week. On fridays 05/26/16   [provider]    Family History Family History  Problem Relation Age of Onset  . Heart disease Maternal Grandmother   . Heart disease Maternal Grandfather   . Diabetes Mother   . Hyperlipidemia Mother   . Hypertension Father   . Heart disease Father   . Hypertension Paternal Grandmother   . Cancer Paternal Grandfather     Social History Social History   Tobacco Use  . Smoking status: Never Smoker  . Smokeless tobacco: Never Used  Substance Use Topics  . Alcohol use: No  . Drug use: Yes    Types: Marijuana    Comment:  12/18/2016 q few days"     Allergies   Peanut-containing drug products; Food; and Ultram [tramadol]   Review of Systems Review of Systems  Genitourinary: Positive for hematuria.  All other systems reviewed and are negative.    Physical Exam Updated Vital Signs BP 123/81 (BP Location: Right Arm)   Pulse 91   Temp 98.2 F (36.8 C) (Oral)   Resp 18   Ht 5\' 3"  (1.6 m)   Wt 56.7 kg (125 lb)   LMP 05/31/2017   SpO2 97%   BMI 22.14 kg/m   Physical Exam  Constitutional: She is oriented to person, place, and time. She appears well-developed and well-nourished. No distress.  HENT:  Head: Normocephalic and atraumatic.  Mouth/Throat: Oropharynx is clear and moist.  Eyes: Pupils are equal, round, and reactive to light. Conjunctivae and EOM are normal.  Neck: Normal range of motion. Neck supple.  Cardiovascular: Normal rate, regular rhythm and intact distal pulses.  No murmur heard. Pulmonary/Chest: Effort normal and breath sounds normal. No respiratory distress. She has no wheezes. She has no rales.  Abdominal: Soft. She exhibits no distension. There is no tenderness. There is CVA tenderness. There is no rebound and no guarding.  No suprapubic tenderness.  Mild bilateral CVA tenderness  Musculoskeletal: Normal range of motion. She exhibits no edema or tenderness.  Neurological: She is alert and oriented to person, place, and time.  Skin: Skin is warm and dry. No rash noted. No erythema.  Psychiatric: She has a normal mood and affect. Her behavior is normal.  Nursing note and vitals reviewed.    ED Treatments / Results  Labs (all labs ordered are listed, but only abnormal results are displayed) Labs Reviewed  URINALYSIS, ROUTINE W REFLEX MICROSCOPIC - Abnormal; Notable for the following components:      Result Value   Color, Urine AMBER (*)    APPearance CLOUDY (*)    Specific Gravity, Urine 1.031 (*)    Glucose, UA >=500 (*)    Hgb urine dipstick LARGE (*)    Protein,  ur 100 (*)    Leukocytes, UA MODERATE (*)    RBC / HPF >50 (*)    All other components within normal limits  COMPREHENSIVE METABOLIC PANEL - Abnormal; Notable for the following components:   Glucose, Bld 63 (*)  Albumin 3.4 (*)    AST 14 (*)    ALT 11 (*)    Total Bilirubin 0.1 (*)    All other components within normal limits  URINE CULTURE  CBC WITH DIFFERENTIAL/PLATELET  CBC WITH DIFFERENTIAL/PLATELET  POC URINE PREG, ED    EKG None  Radiology US Renal  Result Date: 06/10/2017 CLINICAL DATA:  Initial evaluation for acute flank pain, UTI. EXAM: RENAL / URINARY TRACT ULTRASOUND COMPLETE COMPARISON:  None. FINDINGS: Right Kidney: Length: 11.2 cm. Echogenicity within normal limits. No mass or hydronephrosis visualized. No nephrolithiasis. Left Kidney: Length: 11.0 cm. Echogenicity within normal limits. No mass lesion or nephrolithiasis. Mild caliectasis without hydronephrosis. Bladder: Prominent irregular circumferential wall thickening with somewhat nodular contour and possible internal debris. Linear echogenic material within the bladder lumen may reflect septation. Findings suspicious for possible acute cystitis given provided history. IMPRESSION: 1. Abnormal circumferential and irregular wall thickening about the bladder, suspicious for acute cystitis given history of UTI. 2. Mild left caliectasis. No frank hydronephrosis about either kidney. Electronically Signed   By: Jeannine Boga M.D.   On: 06/10/2017 21:53    Procedures Procedures (including critical care time)  Medications Ordered in ED Medications - No data to display   Initial Impression / Assessment and Plan / ED Course  I have reviewed the triage vital signs and the nursing notes.  Pertinent labs & imaging results that were available during my care of the patient were reviewed by me and considered in my medical decision making (see chart for details).     27 year old female type I diabetic presenting with  hematuria and flank pain.  Today patient has a UA that is concerning for infection with large hemoglobin, 21-50 white cells moderate leukocytes but no bacteria seen.  Patient has had issues in the past most recently in January which sounded similar but at that time she had a Foley catheter.  She was treated with antibiotics and had an ultrasound which showed thickening of the bladder.  Patient does not smoke and has never followed up with urology.  She is low suspicion that patient's symptoms are vaginal in nature.  UPT is within normal limits.  We will do renal ultrasound today as well as CBC and CMP to ensure no worsening renal function.  Urine was cultured.  We will plan on treating with antibiotics and giving follow-up to urology.  9:08 PM CMP showing hypoglycemia with a blood sugar of 63 but normal renal function.  No other abnormalities.  Discussed findings with the patient and gave her some orange juice.  Patient states she does not want to wait for the ultrasound but would prefer to follow-up.  Patient's prior cultures have not grown out anything for susceptibilities.  Will start on Keflex.  Encourage patient to contact urology for follow-up.  U/S here was unchanged from [facility] with bladder wall thickening.    Final Clinical Impressions(s) / ED Diagnoses   Final diagnoses:  Gross hematuria  Lower urinary tract infectious disease    ED Discharge Orders        Ordered    cephALEXin (KEFLEX) 500 MG capsule  4 times daily     06/10/17 2111       Blanchie Dessert, MD 06/10/17 2313

## 2017-06-10 NOTE — ED Triage Notes (Signed)
Pt reports bilat side pains since last Friday. Reports for 2 months had blood in urine with clots at times.  Reports that she was seen and told everything fine but wants another opinion.

## 2017-06-12 LAB — URINE CULTURE

## 2017-11-02 ENCOUNTER — Encounter (HOSPITAL_COMMUNITY): Payer: Self-pay | Admitting: *Deleted

## 2017-11-02 ENCOUNTER — Other Ambulatory Visit: Payer: Self-pay

## 2017-11-02 ENCOUNTER — Inpatient Hospital Stay (HOSPITAL_COMMUNITY)
Admission: EM | Admit: 2017-11-02 | Discharge: 2017-11-07 | DRG: 391 | Disposition: A | Discharge status: Home / Self Care | Payer: Medicaid Other | Attending: Internal Medicine | Admitting: Internal Medicine

## 2017-11-02 DIAGNOSIS — Z91018 Allergy to other foods: Secondary | ICD-10-CM | POA: Diagnosis not present

## 2017-11-02 DIAGNOSIS — I10 Essential (primary) hypertension: Secondary | ICD-10-CM | POA: Diagnosis present

## 2017-11-02 DIAGNOSIS — M1990 Unspecified osteoarthritis, unspecified site: Secondary | ICD-10-CM | POA: Diagnosis present

## 2017-11-02 DIAGNOSIS — R112 Nausea with vomiting, unspecified: Secondary | ICD-10-CM | POA: Diagnosis not present

## 2017-11-02 DIAGNOSIS — E1043 Type 1 diabetes mellitus with diabetic autonomic (poly)neuropathy: Secondary | ICD-10-CM | POA: Diagnosis present

## 2017-11-02 DIAGNOSIS — Z9049 Acquired absence of other specified parts of digestive tract: Secondary | ICD-10-CM

## 2017-11-02 DIAGNOSIS — F1290 Cannabis use, unspecified, uncomplicated: Secondary | ICD-10-CM | POA: Diagnosis present

## 2017-11-02 DIAGNOSIS — Z8249 Family history of ischemic heart disease and other diseases of the circulatory system: Secondary | ICD-10-CM

## 2017-11-02 DIAGNOSIS — K3184 Gastroparesis: Secondary | ICD-10-CM | POA: Diagnosis present

## 2017-11-02 DIAGNOSIS — Z886 Allergy status to analgesic agent status: Secondary | ICD-10-CM

## 2017-11-02 DIAGNOSIS — E876 Hypokalemia: Secondary | ICD-10-CM | POA: Diagnosis present

## 2017-11-02 DIAGNOSIS — R109 Unspecified abdominal pain: Secondary | ICD-10-CM | POA: Diagnosis present

## 2017-11-02 DIAGNOSIS — E1143 Type 2 diabetes mellitus with diabetic autonomic (poly)neuropathy: Secondary | ICD-10-CM

## 2017-11-02 DIAGNOSIS — K760 Fatty (change of) liver, not elsewhere classified: Secondary | ICD-10-CM | POA: Diagnosis present

## 2017-11-02 DIAGNOSIS — Z6823 Body mass index (BMI) 23.0-23.9, adult: Secondary | ICD-10-CM | POA: Diagnosis not present

## 2017-11-02 DIAGNOSIS — Z91010 Allergy to peanuts: Secondary | ICD-10-CM

## 2017-11-02 DIAGNOSIS — N390 Urinary tract infection, site not specified: Secondary | ICD-10-CM | POA: Diagnosis present

## 2017-11-02 DIAGNOSIS — F419 Anxiety disorder, unspecified: Secondary | ICD-10-CM | POA: Diagnosis present

## 2017-11-02 DIAGNOSIS — J45909 Unspecified asthma, uncomplicated: Secondary | ICD-10-CM | POA: Diagnosis present

## 2017-11-02 DIAGNOSIS — Z794 Long term (current) use of insulin: Secondary | ICD-10-CM

## 2017-11-02 DIAGNOSIS — Z9641 Presence of insulin pump (external) (internal): Secondary | ICD-10-CM | POA: Diagnosis present

## 2017-11-02 DIAGNOSIS — E860 Dehydration: Secondary | ICD-10-CM | POA: Diagnosis present

## 2017-11-02 DIAGNOSIS — E43 Unspecified severe protein-calorie malnutrition: Secondary | ICD-10-CM | POA: Diagnosis present

## 2017-11-02 DIAGNOSIS — E870 Hyperosmolality and hypernatremia: Secondary | ICD-10-CM | POA: Diagnosis present

## 2017-11-02 DIAGNOSIS — Z79899 Other long term (current) drug therapy: Secondary | ICD-10-CM

## 2017-11-02 DIAGNOSIS — K219 Gastro-esophageal reflux disease without esophagitis: Secondary | ICD-10-CM | POA: Diagnosis present

## 2017-11-02 DIAGNOSIS — Z8349 Family history of other endocrine, nutritional and metabolic diseases: Secondary | ICD-10-CM

## 2017-11-02 DIAGNOSIS — IMO0002 Reserved for concepts with insufficient information to code with codable children: Secondary | ICD-10-CM | POA: Diagnosis present

## 2017-11-02 DIAGNOSIS — E1065 Type 1 diabetes mellitus with hyperglycemia: Secondary | ICD-10-CM | POA: Diagnosis present

## 2017-11-02 DIAGNOSIS — Z833 Family history of diabetes mellitus: Secondary | ICD-10-CM

## 2017-11-02 DIAGNOSIS — Z9114 Patient's other noncompliance with medication regimen: Secondary | ICD-10-CM | POA: Diagnosis not present

## 2017-11-02 DIAGNOSIS — R000 Tachycardia, unspecified: Secondary | ICD-10-CM | POA: Diagnosis present

## 2017-11-02 LAB — LIPASE, BLOOD
Lipase: 24 U/L (ref 11–51)
Lipase: 22 U/L (ref 11–51)

## 2017-11-02 LAB — CBC
HCT: 43.2 % (ref 36.0–46.0)
Hemoglobin: 13.9 g/dL (ref 12.0–15.0)
MCH: 28.1 pg (ref 26.0–34.0)
MCHC: 32.2 g/dL (ref 30.0–36.0)
MCV: 87.3 fL (ref 80.0–100.0)
PLATELETS: 419 10*3/uL — AB (ref 150–400)
RBC: 4.95 MIL/uL (ref 3.87–5.11)
RDW: 13.3 % (ref 11.5–15.5)
WBC: 9.7 10*3/uL (ref 4.0–10.5)
nRBC: 0 % (ref 0.0–0.2)

## 2017-11-02 LAB — COMPREHENSIVE METABOLIC PANEL
ALBUMIN: 4.7 g/dL (ref 3.5–5.0)
ALK PHOS: 85 U/L (ref 38–126)
ALT: 13 U/L (ref 0–44)
AST: 19 U/L (ref 15–41)
Anion gap: 12 (ref 5–15)
BUN: 20 mg/dL (ref 6–20)
CALCIUM: 10.4 mg/dL — AB (ref 8.9–10.3)
CO2: 27 mmol/L (ref 22–32)
CREATININE: 0.68 mg/dL (ref 0.44–1.00)
Chloride: 108 mmol/L (ref 98–111)
GFR calc Af Amer: >60 mL/min (ref >60)
GFR calc non Af Amer: >60 mL/min (ref >60)
GLUCOSE: 82 mg/dL (ref 70–99)
Potassium: 3.5 mmol/L (ref 3.5–5.1)
Sodium: 147 mmol/L — ABNORMAL HIGH (ref 135–145)
Total Bilirubin: 0.8 mg/dL (ref 0.3–1.2)
Total Protein: 8.5 g/dL — ABNORMAL HIGH (ref 6.5–8.1)

## 2017-11-02 LAB — CBG MONITORING, ED
Glucose-Capillary: 65 mg/dL — ABNORMAL LOW (ref 70–99)
Glucose-Capillary: 94 mg/dL (ref 70–99)
Glucose-Capillary: 171 mg/dL — ABNORMAL HIGH (ref 70–99)

## 2017-11-02 LAB — HEMOGLOBIN A1C
Hgb A1c MFr Bld: 8.1 % — ABNORMAL HIGH (ref 4.8–5.6)
MEAN PLASMA GLUCOSE: 185.77 mg/dL

## 2017-11-02 LAB — MAGNESIUM: Magnesium: 1.7 mg/dL (ref 1.7–2.4)

## 2017-11-02 LAB — I-STAT BETA HCG BLOOD, ED (MC, WL, AP ONLY): I-stat hCG, quantitative: <5 m[IU]/mL (ref <5)

## 2017-11-02 MED ORDER — SODIUM CHLORIDE 0.9 % IV SOLN: Freq: Once | INTRAVENOUS | Status: DC

## 2017-11-02 MED ORDER — DEXTROSE 50 % IV SOLN
0.5000 | Freq: Once | INTRAVENOUS | Status: AC
  Administered 2017-11-02: 25 mL via INTRAVENOUS
  Filled 2017-11-02: qty 50

## 2017-11-02 MED ORDER — LORAZEPAM 2 MG/ML IJ SOLN
0.5000 mg | Freq: Once | INTRAMUSCULAR | Status: AC
  Administered 2017-11-02: 0.5 mg via INTRAVENOUS
  Filled 2017-11-02: qty 1

## 2017-11-02 MED ORDER — FAMOTIDINE IN NACL 20-0.9 MG/50ML-% IV SOLN
20.0000 mg | Freq: Two times a day (BID) | INTRAVENOUS | Status: DC
  Administered 2017-11-02 – 2017-11-06 (×8): 20 mg via INTRAVENOUS
  Filled 2017-11-02 (×8): qty 50

## 2017-11-02 MED ORDER — PROMETHAZINE HCL 25 MG/ML IJ SOLN: 25.0000 mg | Freq: Once | INTRAMUSCULAR | Status: DC

## 2017-11-02 MED ORDER — ACETAMINOPHEN 650 MG RE SUPP: 650.0000 mg | Freq: Four times a day (QID) | RECTAL | Status: DC | PRN

## 2017-11-02 MED ORDER — TRAMADOL HCL 50 MG PO TABS: 100.0000 mg | ORAL_TABLET | Freq: Four times a day (QID) | ORAL | Status: DC | PRN

## 2017-11-02 MED ORDER — SODIUM CHLORIDE 0.9 % IV SOLN
INTRAVENOUS | Status: DC
  Administered 2017-11-03: 05:00:00 via INTRAVENOUS

## 2017-11-02 MED ORDER — FENTANYL CITRATE (PF) 100 MCG/2ML IJ SOLN
50.0000 ug | Freq: Once | INTRAMUSCULAR | Status: DC
  Filled 2017-11-02: qty 2

## 2017-11-02 MED ORDER — ALBUTEROL SULFATE HFA 108 (90 BASE) MCG/ACT IN AERS: 1.0000 | INHALATION_SPRAY | Freq: Four times a day (QID) | RESPIRATORY_TRACT | Status: DC | PRN

## 2017-11-02 MED ORDER — ONDANSETRON HCL 4 MG/2ML IJ SOLN
4.0000 mg | Freq: Four times a day (QID) | INTRAMUSCULAR | Status: DC | PRN
  Administered 2017-11-02 – 2017-11-06 (×6): 4 mg via INTRAVENOUS
  Filled 2017-11-02 (×6): qty 2

## 2017-11-02 MED ORDER — INSULIN ASPART 100 UNIT/ML ~~LOC~~ SOLN
0.0000 [IU] | SUBCUTANEOUS | Status: DC
  Administered 2017-11-02: 5 [IU] via SUBCUTANEOUS
  Administered 2017-11-03 (×2): 2 [IU] via SUBCUTANEOUS
  Administered 2017-11-03: 3 [IU] via SUBCUTANEOUS
  Administered 2017-11-03: 7 [IU] via SUBCUTANEOUS
  Administered 2017-11-03: 5 [IU] via SUBCUTANEOUS
  Administered 2017-11-03: 3 [IU] via SUBCUTANEOUS
  Administered 2017-11-03: 5 [IU] via SUBCUTANEOUS
  Administered 2017-11-04 (×2): 3 [IU] via SUBCUTANEOUS

## 2017-11-02 MED ORDER — PROCHLORPERAZINE EDISYLATE 10 MG/2ML IJ SOLN
10.0000 mg | Freq: Four times a day (QID) | INTRAMUSCULAR | Status: DC | PRN
  Administered 2017-11-02 – 2017-11-05 (×9): 10 mg via INTRAVENOUS
  Filled 2017-11-02 (×9): qty 2

## 2017-11-02 MED ORDER — MORPHINE SULFATE (PF) 4 MG/ML IV SOLN
4.0000 mg | Freq: Once | INTRAVENOUS | Status: AC
  Administered 2017-11-02: 4 mg via INTRAVENOUS
  Filled 2017-11-02: qty 1

## 2017-11-02 MED ORDER — ONDANSETRON HCL 4 MG PO TABS
4.0000 mg | ORAL_TABLET | Freq: Four times a day (QID) | ORAL | Status: DC | PRN
  Filled 2017-11-02: qty 1

## 2017-11-02 MED ORDER — PROMETHAZINE HCL 25 MG/ML IJ SOLN
12.5000 mg | Freq: Once | INTRAMUSCULAR | Status: DC
  Filled 2017-11-02: qty 1

## 2017-11-02 MED ORDER — HALOPERIDOL LACTATE 5 MG/ML IJ SOLN
2.0000 mg | Freq: Once | INTRAMUSCULAR | Status: AC
  Administered 2017-11-02: 2 mg via INTRAVENOUS
  Filled 2017-11-02: qty 1

## 2017-11-02 MED ORDER — ONDANSETRON HCL 4 MG/2ML IJ SOLN
4.0000 mg | Freq: Once | INTRAMUSCULAR | Status: AC | PRN
  Administered 2017-11-02: 4 mg via INTRAVENOUS
  Filled 2017-11-02: qty 2

## 2017-11-02 MED ORDER — LACTATED RINGERS IV BOLUS
2000.0000 mL | Freq: Once | INTRAVENOUS | Status: AC
  Administered 2017-11-02: 2000 mL via INTRAVENOUS

## 2017-11-02 MED ORDER — POLYETHYLENE GLYCOL 3350 17 G PO PACK: 17.0000 g | PACK | Freq: Every day | ORAL | Status: DC | PRN

## 2017-11-02 MED ORDER — ALBUTEROL SULFATE (2.5 MG/3ML) 0.083% IN NEBU: 2.5000 mg | INHALATION_SOLUTION | Freq: Four times a day (QID) | RESPIRATORY_TRACT | Status: DC | PRN

## 2017-11-02 MED ORDER — SODIUM CHLORIDE 0.9 % IV SOLN
INTRAVENOUS | Status: DC
  Administered 2017-11-02 – 2017-11-03 (×2): via INTRAVENOUS

## 2017-11-02 MED ORDER — METOPROLOL TARTRATE 25 MG PO TABS: 12.5000 mg | ORAL_TABLET | Freq: Two times a day (BID) | ORAL | Status: DC

## 2017-11-02 MED ORDER — PROCHLORPERAZINE EDISYLATE 10 MG/2ML IJ SOLN: 10.0000 mg | Freq: Four times a day (QID) | INTRAMUSCULAR | Status: DC | PRN

## 2017-11-02 MED ORDER — INSULIN GLARGINE 100 UNIT/ML ~~LOC~~ SOLN
5.0000 [IU] | Freq: Two times a day (BID) | SUBCUTANEOUS | Status: DC
  Administered 2017-11-02 – 2017-11-03 (×2): 5 [IU] via SUBCUTANEOUS
  Filled 2017-11-02 (×3): qty 0.05

## 2017-11-02 MED ORDER — ACETAMINOPHEN 325 MG PO TABS: 650.0000 mg | ORAL_TABLET | Freq: Four times a day (QID) | ORAL | Status: DC | PRN

## 2017-11-02 MED ORDER — METOCLOPRAMIDE HCL 5 MG/ML IJ SOLN
10.0000 mg | Freq: Three times a day (TID) | INTRAMUSCULAR | Status: DC
  Administered 2017-11-02 – 2017-11-03 (×2): 10 mg via INTRAVENOUS
  Filled 2017-11-02 (×2): qty 2

## 2017-11-02 NOTE — ED Notes (Signed)
Attempted report x1. 

## 2017-11-02 NOTE — ED Notes (Signed)
Unable to obtain pt temp due to emesis

## 2017-11-02 NOTE — ED Notes (Signed)
ED TO INPATIENT HANDOFF REPORT  Name/Age/Gender [name] 27 y.o. female  Code Status Code Status History    Date Active Date Inactive Code Status Order ID Comments User Context   01/21/2017 2304 01/29/2017 1728 Full Code [id number]  Rise Patience, MD ED   12/18/2016 0704 12/20/2016 1727 Full Code [id number]  Rondel Jumbo, PA-C ED   11/05/2016 1525 11/11/2016 1456 Full Code [id number]  Phillips Grout, MD Inpatient   10/23/2016 1342 10/29/2016 2103 Full Code [id number]  Elwin Mocha, MD ED   09/26/2016 1503 09/29/2016 1533 Full Code [id number]  Georgette Shell, MD ED   07/15/2016 1712 07/18/2016 2035 Full Code [id number]  Damita Lack, MD Inpatient   07/05/2016 1658 07/11/2016 1620 Full Code [id number]  Bonnielee Haff, MD Inpatient   06/23/2016 0856 06/28/2016 1346 Full Code [id number]  Janece Canterbury, MD Inpatient   06/12/2016 2137 06/14/2016 1723 Full Code [id number]  Sid Falcon, MD Inpatient   06/03/2016 2044 06/06/2016 2041 Full Code [id number]  Gardiner Barefoot, NP Inpatient   05/31/2016 2348 06/03/2016 2044 Full Code [id number]  Rise Patience, MD Inpatient   05/14/2016 0212 05/16/2016 1718 Full Code [id number]  Toy Baker, MD Inpatient   05/04/2016 2140 05/10/2016 2004 Full Code [id number]  Orson Eva, MD Inpatient   02/29/2016 2201 03/04/2016 1646 Full Code [id number]  Toy Baker, MD Inpatient   02/20/2016 1441 02/25/2016 1723 Full Code [id number]  Elgergawy, Silver Huguenin, MD Inpatient   01/22/2016 1021 01/25/2016 1607 Full Code [id number]  Orson Eva, MD Inpatient   01/17/2016 0634 01/19/2016 1944 Full Code [id number]  Etta Quill, DO ED   11/18/2015 2238 11/23/2015 1231 Full Code [id number]  Bonnielee Haff, MD Inpatient   11/09/2015 2048 11/12/2015 1535 Full Code [id number]  Toy Baker, MD Inpatient   11/09/2015 2048 11/09/2015 2048 Full Code [id number]  Toy Baker, MD Inpatient   11/03/2015 0238 11/06/2015 1301 Full Code [id number]  Jani Gravel, MD Inpatient   11/02/2015 0128 11/03/2015 0238 Full Code [id number]  Phillips Grout, MD Inpatient   07/10/2015 1645 07/13/2015 1431 Full Code [id number]  Ivor Costa, MD ED   07/01/2015 2203 07/06/2015 1522 Full Code [id number]  Gennaro Africa, MD ED   06/22/2015 0624 06/24/2015 1531 Full Code [id number]  Etta Quill, DO Inpatient   06/10/2015 0156 06/11/2015 1519 Full Code [id number]  Reubin Milan, MD Inpatient   05/28/2015 1609 06/03/2015 1932 Full Code [id number]  Barton Dubois, MD Inpatient   05/24/2015 2359 05/25/2015 1858 Full Code [id number]  Theressa Millard, MD Inpatient   04/21/2015 0931 04/23/2015 1445 Full Code [id number]  Gennaro Africa, MD ED   04/08/2015 2229 04/15/2015 1605 Full Code [id number]  Reubin Milan, MD ED   03/22/2015 0243 03/25/2015 1811 Full Code [id number]  Rise Patience, MD ED   02/11/2015 1530 02/12/2015 1805 Full Code [id number]  Greer Pickerel, MD Inpatient   11/24/2014 1309 11/29/2014 1929 Full Code [id number]  Rama, Venetia Maxon, MD Inpatient   09/16/2014 0530 09/21/2014 2007 Full Code [id number]  Etta Quill, DO ED   06/29/2012 1400 07/01/2012 1517 Full Code [id number]  Velvet Bathe, MD Inpatient   12/08/2011 1543 12/12/2011 1450 Full Code [id number]  Chyrel Masson, RN Inpatient      Home/SNF/Other Home  Chief Complaint vomiting   Level of Care/Admitting Diagnosis ED Disposition    ED Disposition Condition [facility] Area: [facility] [100102]  Level of Care: Telemetry [5]  Admit to tele based on following criteria: Monitor QTC interval  Admit to tele based on following criteria: Other see comments  Comments: tachycardia  Diagnosis: Intractable nausea and vomiting [448185]  Admitting Physician: Charlynne Cousins [[id number]]  Attending Physician: Charlynne Cousins [[id number]]  PT Class (Do Not Modify): Observation [104]  PT Acc Code (Do Not Modify): Observation [10022]       Medical History Past Medical History:   Diagnosis Date  . Anxiety   . Arthritis    "hands, feet, knees" (12/18/2016)  . Asthma   . Diabetic gastroparesis ([place])    Per gastric emptying study 07/09/16 which showed significant delayed gastric emptying.  . Gallstones   . Gastroparesis   . GERD (gastroesophageal reflux disease)   . Heart murmur   . Hepatic steatosis 11/26/2014   and hepatomegaly  . Hypertension    hx (12/18/2016)  . Intractable cyclical vomiting syndrome    Archie Endo 12/18/2016  . Liver mass 11/26/2014  . Pancreatitis, acute 11/26/2014  . Pneumonia    "as a teen X 1" (12/18/2016)  . Type I diabetes mellitus ([place]) 2007   IDDM.  poorly controlled, multiple admits with DKA    Allergies Allergies  Allergen Reactions  . Peanut-Containing Drug Products Swelling and Other (See Comments)    Reaction:  Swelling of mouth and lips   . 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   Reaction:  Swelling of mouth and lips   . Ultram [Tramadol] Itching    IV Location/Drains/Wounds Patient Lines/Drains/Airways Status   Active Line/Drains/Airways    Name:   Placement date:   Placement time:   Site:   Days:   Peripheral IV 11/02/17 Right Antecubital   11/02/17    1335    Antecubital   less than 1          Labs/Imaging Results for orders placed or performed during the hospital encounter of 11/02/17 (from the past 48 hour(s))  Lipase, blood     Status: None   Collection Time: 11/02/17  1:32 PM  Result Value Ref Range   Lipase 24 11 - 51 U/L    Comment: Performed at [facility], [place] [address] [place], [place] [address]  Comprehensive metabolic panel     Status: Abnormal   Collection Time: 11/02/17  1:32 PM  Result Value Ref Range   Sodium 147 (H) 135 - 145 mmol/L   Potassium 3.5 3.5 - 5.1 mmol/L   Chloride 108 98 - 111 mmol/L   CO2 27 22 - 32 mmol/L   Glucose, Bld 82 70 - 99 mg/dL   BUN 20 6 - 20 mg/dL   Creatinine, Ser 0.68 0.44 - 1.00 mg/dL   Calcium 10.4  (H) 8.9 - 10.3 mg/dL   Total Protein 8.5 (H) 6.5 - 8.1 g/dL   Albumin 4.7 3.5 - 5.0 g/dL   AST 19 15 - 41 U/L   ALT 13 0 - 44 U/L   Alkaline Phosphatase 85 38 - 126 U/L   Total Bilirubin 0.8 0.3 - 1.2 mg/dL   GFR calc non Af Amer >60 >60 mL/min   GFR calc Af Amer >60 >60 mL/min    Comment: (NOTE) The eGFR has been calculated using the CKD EPI equation. This calculation has not been validated in all clinical situations. eGFR's persistently <60 mL/min signify possible Chronic Kidney Disease.    Anion gap 12 5 - 15    Comment: Performed  at [facility], [place] [address], [place], [place] [address]  CBC     Status: Abnormal   Collection Time: 11/02/17  1:32 PM  Result Value Ref Range   WBC 9.7 4.0 - 10.5 K/uL   RBC 4.95 3.87 - 5.11 MIL/uL   Hemoglobin 13.9 12.0 - 15.0 g/dL   HCT 43.2 36.0 - 46.0 %   MCV 87.3 80.0 - 100.0 fL   MCH 28.1 26.0 - 34.0 pg   MCHC 32.2 30.0 - 36.0 g/dL   RDW 13.3 11.5 - 15.5 %   Platelets 419 (H) 150 - 400 K/uL   nRBC 0.0 0.0 - 0.2 %    Comment: Performed at [facility], [place] [address] [place], [place] [address]  I-Stat beta hCG blood, ED     Status: None   Collection Time: 11/02/17  1:36 PM  Result Value Ref Range   I-stat hCG, quantitative <5.0 <5 mIU/mL   Comment 3            Comment:   GEST. AGE      CONC.  (mIU/mL)   <=1 WEEK        5 - 50     2 WEEKS       50 - 500     3 WEEKS       100 - 10,000     4 WEEKS     1,000 - 30,000        FEMALE AND NON-PREGNANT FEMALE:     LESS THAN 5 mIU/mL   CBG monitoring, ED     Status: Abnormal   Collection Time: 11/02/17  2:21 PM  Result Value Ref Range   Glucose-Capillary 65 (L) 70 - 99 mg/dL  CBG monitoring, ED     Status: None   Collection Time: 11/02/17  3:09 PM  Result Value Ref Range   Glucose-Capillary 94 70 - 99 mg/dL  CBG monitoring, ED     Status: Abnormal   Collection Time: 11/02/17  4:19 PM  Result Value Ref Range   Glucose-Capillary 171 (H) 70 - 99  mg/dL   No results found.  Pending Labs Unresulted Labs (From admission, onward)    Start     Ordered   11/02/17 1330  Rapid urine drug screen (hospital performed)  STAT,   R     11/02/17 1329   11/02/17 1310  Urinalysis, Routine w reflex microscopic  STAT,   STAT     11/02/17 1309          Vitals/Pain Today's Vitals   11/02/17 1500 11/02/17 1515 11/02/17 1600 11/02/17 1630  BP: 122/78  131/77 121/79  Pulse: 100  (!) 104 (!) 107  Resp: 18  17   Temp:      TempSrc:      SpO2: 92%  100% 100%  Weight:      Height:      PainSc:  5       Isolation Precautions No active isolations  Medications Medications  0.9 %  sodium chloride infusion (has no administration in time range)  promethazine (PHENERGAN) injection 12.5 mg (has no administration in time range)  fentaNYL (SUBLIMAZE) injection 50 mcg (has no administration in time range)  ondansetron (ZOFRAN) injection 4 mg (4 mg Intravenous Given 11/02/17 1336)  haloperidol lactate (HALDOL) injection 2 mg (2 mg Intravenous Given 11/02/17 1420)  lactated ringers bolus 2,000 mL (2,000 mLs Intravenous New Bag/Given 11/02/17 1426)  morphine 4 MG/ML injection  4 mg (4 mg Intravenous Given 11/02/17 1434)  dextrose 50 % solution 25 mL (25 mLs Intravenous Given 11/02/17 1435)  LORazepam (ATIVAN) injection 0.5 mg (0.5 mg Intravenous Given 11/02/17 1624)    Mobility walks

## 2017-11-02 NOTE — H&P (Signed)
History and Physical    [name] ZMO:[id number] DOB: 1990/03/31 DOA: 11/02/2017  PCP: Vicenta Aly, FNP  Patient coming from: home  Chief Complaint: *Intractable nausea and vomiting HPI: [name] is a 27 y.o. female with medical history significant of diabetes mellitus type 1 essential hypertension, gastroparesis noncompliant with her medication and past medical history of drug abuse marijuana whose had multiple admission for gastroparesis in the setting of marijuana use comes in for nausea and vomiting that started on the day of admission progressively getting worse unable to keep anything down since this morning.  She relates she use cannabis over the weekend, she also relates abdominal pain that started this afternoon.  She denies any fevers, chills, chest pain, shortness of breath, no recent procedures, no change in her medications, no cuts or sores  ED Course:  Found to be mildly tachycardic, mildly hypernatremic no leukocytosis or left shift urine pregnancy test is negative  Review of Systems: As per HPI otherwise 10 point review of systems negative.    Past Medical History:  Diagnosis Date  . Anxiety   . Arthritis    "hands, feet, knees" (12/18/2016)  . Asthma   . Diabetic gastroparesis ([place])    Per gastric emptying study 07/09/16 which showed significant delayed gastric emptying.  . Gallstones   . Gastroparesis   . GERD (gastroesophageal reflux disease)   . Heart murmur   . Hepatic steatosis 11/26/2014   and hepatomegaly  . Hypertension    hx (12/18/2016)  . Intractable cyclical vomiting syndrome    Archie Endo 12/18/2016  . Liver mass 11/26/2014  . Pancreatitis, acute 11/26/2014  . Pneumonia    "as a teen X 1" (12/18/2016)  . Type I diabetes mellitus ([place]) 2007   IDDM.  poorly controlled, multiple admits with DKA    Past Surgical History:  Procedure Laterality Date  . CHOLECYSTECTOMY N/A 02/11/2015   Procedure: LAPAROSCOPIC CHOLECYSTECTOMY WITH  INTRAOPERATIVE CHOLANGIOGRAM;  Surgeon: Greer Pickerel, MD;  Location: [facility];  Service: General;  Laterality: N/A;  . ESOPHAGOGASTRODUODENOSCOPY (EGD) WITH PROPOFOL Left 09/20/2014   Procedure: ESOPHAGOGASTRODUODENOSCOPY (EGD) WITH PROPOFOL;  Surgeon: Arta Silence, MD;  Location: [facility] ENDOSCOPY;  Service: Endoscopy;  Laterality: Left;  . WISDOM TOOTH EXTRACTION       reports that she has never smoked. She has never used smokeless tobacco. She reports that she has current or past drug history. Drug: Marijuana. She reports that she does not drink alcohol.  Allergies  Allergen Reactions  . Peanut-Containing Drug Products Swelling and Other (See Comments)    Reaction:  Swelling of mouth and lips   . 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   Reaction:  Swelling of mouth and lips   . Ultram [Tramadol] Itching    Family History  Problem Relation Age of Onset  . Heart disease Maternal Grandmother   . Heart disease Maternal Grandfather   . Diabetes Mother   . Hyperlipidemia Mother   . Hypertension Father   . Heart disease Father   . Hypertension Paternal Grandmother   . Cancer Paternal Grandfather     Prior to Admission medications   Medication Sig Start Date End Date Taking? Authorizing Provider  albuterol (PROVENTIL HFA;VENTOLIN HFA) 108 (90 Base) MCG/ACT inhaler Inhale 1-2 puffs into the lungs every 6 (six) hours as needed for wheezing or shortness of breath.    [provider]  cephALEXin (KEFLEX) 500 MG capsule Take 1 capsule (500 mg total) by  mouth 4 (four) times daily. 06/10/17   Blanchie Dessert, MD  glucagon (GLUCAGON EMERGENCY) 1 MG injection Inject 1 mg into the muscle once as needed (For very low blood sugars. May repeat in 15-20 minutes if needed.). 12/20/16   Hongalgi, Lenis Dickinson, MD  insulin aspart (NOVOLOG FLEXPEN) 100 UNIT/ML FlexPen Inject 1-10 Units into the skin 3 (three) times daily with meals. Pt uses 1 unit for every 10 grams of  carbs with meals and snacks for BS greater than 50. Sliding scale 1 units/50 when CBG > 150    [provider]  insulin glargine (LANTUS) 100 UNIT/ML injection Inject 0.25 mLs (25 Units total) into the skin daily. Patient not taking: Reported on 06/10/2017 01/26/17   Dessa Phi, DO  lisinopril (ZESTRIL) 2.5 MG tablet Take 2.5 mg by mouth daily. 02/11/17   [provider]  metoCLOPramide (REGLAN) 5 MG tablet Take 1 tablet (5 mg total) by mouth 3 (three) times daily before meals. Patient not taking: Reported on 06/10/2017 01/29/17   Domenic Polite, MD  metoprolol tartrate (LOPRESSOR) 25 MG tablet Take 0.5 tablets (12.5 mg total) by mouth 2 (two) times daily. Patient not taking: Reported on 06/10/2017 05/10/16   Mariel Aloe, MD  ondansetron (ZOFRAN) 4 MG tablet Take 1 tablet (4 mg total) by mouth every 8 (eight) hours as needed for nausea, vomiting or refractory nausea / vomiting. Patient not taking: Reported on 06/10/2017 01/26/17   Dessa Phi, DO  oxybutynin (DITROPAN) 5 MG tablet Take 0.5 tablets (2.5 mg total) by mouth 3 (three) times daily. Patient not taking: Reported on 06/10/2017 10/29/16   Donne Hazel, MD  promethazine (PHENERGAN) 25 MG suppository Place 1 suppository (25 mg total) rectally every 6 (six) hours as needed for nausea or vomiting. Patient not taking: Reported on 06/10/2017 02/02/17   Jeannett Senior, PA-C  promethazine (PHENERGAN) 25 MG tablet Take 1 tablet (25 mg total) by mouth every 6 (six) hours as needed for nausea or vomiting. Patient not taking: Reported on 06/10/2017 02/02/17   Jeannett Senior, PA-C  RESTASIS MULTIDOSE 0.05 % ophthalmic emulsion Place 1 drop into both eyes every 12 (twelve) hours. 09/08/16   [provider]    Physical Exam: Vitals:   11/02/17 1442 11/02/17 1500 11/02/17 1600 11/02/17 1630  BP:  122/78 131/77 121/79  Pulse: 97 100 (!) 104 (!) 107  Resp: 12 18 17    Temp:      TempSrc:      SpO2: 97% 92% 100%  100%  Weight:      Height:        Constitutional: NAD, calm, she appears uncomfortable Vitals:   11/02/17 1442 11/02/17 1500 11/02/17 1600 11/02/17 1630  BP:  122/78 131/77 121/79  Pulse: 97 100 (!) 104 (!) 107  Resp: 12 18 17    Temp:      TempSrc:      SpO2: 97% 92% 100% 100%  Weight:      Height:       Eyes: PERRL, lids and conjunctivae normal ENMT: Mucous membranes are dry. Neck: normal, supple, no masses, no thyromegaly Respiratory: clear to auscultation bilaterally, no wheezing, no crackles. Normal respiratory effort. No accessory muscle use.  Cardiovascular: Regular rate and rhythm, no murmurs / rubs / gallops. No extremity edema. 2+ pedal pulses. No carotid bruits.  Abdomen: Some epigastric tenderness no rebound or guarding no masses palpated. No hepatosplenomegaly. Bowel sounds positive.  Negative CVA tenderness Musculoskeletal: no clubbing / cyanosis. No joint deformity upper  and lower extremities. Good ROM, no contractures. Normal muscle tone.  Skin: no rashes, lesions, ulcers. No induration Neurologic: CN 2-12 grossly intact. Sensation intact, DTR normal. Strength 5/5 in all 4.  Psychiatric: Normal judgment and insight. Alert and oriented x 3. Normal mood.     Labs on Admission: I have personally reviewed following labs and imaging studies  CBC: Recent Labs  Lab 11/02/17 1332  WBC 9.7  HGB 13.9  HCT 43.2  MCV 87.3  PLT 884*   Basic Metabolic Panel: Recent Labs  Lab 11/02/17 1332  NA 147*  K 3.5  CL 108  CO2 27  GLUCOSE 82  BUN 20  CREATININE 0.68  CALCIUM 10.4*   GFR: Estimated Creatinine Clearance: 86.6 mL/min (by C-G formula based on SCr of 0.68 mg/dL). Liver Function Tests: Recent Labs  Lab 11/02/17 1332  AST 19  ALT 13  ALKPHOS 85  BILITOT 0.8  PROT 8.5*  ALBUMIN 4.7   Recent Labs  Lab 11/02/17 1332  LIPASE 24   No results for input(s): AMMONIA in the last 168 hours. Coagulation Profile: No results for input(s): INR, PROTIME in  the last 168 hours. Cardiac Enzymes: No results for input(s): CKTOTAL, CKMB, CKMBINDEX, TROPONINI in the last 168 hours. BNP (last 3 results) No results for input(s): PROBNP in the last 8760 hours. HbA1C: No results for input(s): HGBA1C in the last 72 hours. CBG: Recent Labs  Lab 11/02/17 1421 11/02/17 1509 11/02/17 1619  GLUCAP 65* 94 171*   Lipid Profile: No results for input(s): CHOL, HDL, LDLCALC, TRIG, CHOLHDL, LDLDIRECT in the last 72 hours. Thyroid Function Tests: No results for input(s): TSH, T4TOTAL, FREET4, T3FREE, THYROIDAB in the last 72 hours. Anemia Panel: No results for input(s): VITAMINB12, FOLATE, FERRITIN, TIBC, IRON, RETICCTPCT in the last 72 hours. Urine analysis:    Component Value Date/Time   COLORURINE AMBER (A) 06/10/2017 1836   APPEARANCEUR CLOUDY (A) 06/10/2017 1836   LABSPEC 1.031 (H) 06/10/2017 1836   PHURINE 6.0 06/10/2017 1836   GLUCOSEU >=500 (A) 06/10/2017 1836   GLUCOSEU >=1000 11/07/2012 1205   HGBUR LARGE (A) 06/10/2017 1836   BILIRUBINUR NEGATIVE 06/10/2017 1836   KETONESUR NEGATIVE 06/10/2017 1836   PROTEINUR 100 (A) 06/10/2017 1836   UROBILINOGEN 0.2 11/24/2014 1045   NITRITE NEGATIVE 06/10/2017 1836   LEUKOCYTESUR MODERATE (A) 06/10/2017 1836    Radiological Exams on Admission: No results found.  EKG: Independently reviewed.  Normal sinus rhythm normal axis no's T wave abnormalities, LVH pattern  Assessment/Plan Intractable nausea and vomiting likely due to gastroparesis setting of cannabis use: With a history of repeated visits to the ED for the same reason and noncompliant with her medications and marijuana use, also admits to marijuana use on Sunday which is probably contributing to his exacerbation. We will check a UDS. Place her n.p.o. start on IV fluids Reglan, Compazine and Zofran as needed.  Also try to use Haldol which seems to have worked in the ED, if the afford treatment not work her electrolytes are stable check a  magnesium level and check a 12-lead EKG in the morning Check lipase I have explained to her that I we will not use narcotics for her pain we will use Tylenol and NSAIDs, we will not use Phenergan for her nausea we will use Zofran and Reglan. She is afebrile with no leukocytosis she has no cuts or sores on her physical exam was unlikely to be an infectious etiology.  Urinalysis is pending. Lead EKG done shows  a normal sinus rhythm normal axis no  T wave changes QTC is 465, will check a 12-lead EKG tomorrow morning. She had some mild streaks of blood in her last emesis when I was in the room we will put on famotidine IV. There is likely Mallory-Weiss tear her hemoglobin has not changed compared to previous, will continue IV fluids and check a CBC in the morning. Place her on SCDs.  Uncontrolled type 1 diabetes mellitus ([place]) Place her n.p.o. Decrease her Lantus at 5 units twice daily start on sliding scale insulin, sensitive with CBGs before meals and at bedtime. Check hemoglobin A1c.  Sinus tachycardia Likely due to hypovolemia in the setting of sympathetic stimulation due to her nausea we will hydrate her aggressively  Protein-calorie malnutrition, severe  Hypernatremia Likely due to hypovolemia start her on normal saline recheck a basic metabolic panel in the morning. Monitor strict I's and O's Daily weight  DVT prophylaxis: SCD's and ambulation.  Code Status: Full Family Communication: Mother Disposition Plan: Probably 2 days  Consults called: None Admission status: Inpatient status  It is my clinical opinion that admission to INPATIENT is reasonable and necessary in this 27 y.o. female past medical history of poorly controlled diabetes, who comes in with intractable nausea vomiting likely due to gastroparesis in the setting of cannabis use . presenting with symptoms of hard to control nausea and vomiting, concerning for concerning for cyclic vomiting syndrome in the setting of  cannabis use and diabetes gastroparesis . with pertinent positives on physical exam including: Epigastric tenderness. . Workup and treatment include as above  Given the aforementioned, the predictability of an adverse outcome is felt to be significant. I expect that the patient will require at least 2 midnights in the hospital to treat this condition.  Charlynne Cousins MD [facility] Pager [phone]  If 7PM-7AM, please contact night-coverage [email or web address] Password [facility]  11/02/2017, 5:37 PM

## 2017-11-02 NOTE — ED Notes (Signed)
Not able to get EKG at the time. Pt has cold sweats and constantly moving. Waiting for symptoms to subside in order to complete tests. (UA & EKG) RN and PA have been notified.

## 2017-11-02 NOTE — ED Triage Notes (Signed)
Pt reports n/v/d x 2 hours.  Hx of gastroparesis and states this episode is similar to past flare ups.  Pt vomiting in triage.

## 2017-11-02 NOTE — ED Provider Notes (Addendum)
[facility] DEPT Provider Note   CSN: [id number] Arrival date & time: 11/02/17  1259     History   Chief Complaint Chief Complaint  Patient presents with  . Abdominal Pain  . Emesis    HPI [name] is a 27 y.o. female with a past medical history of poorly controlled type 1 diabetes he is on insulin pump, recurrent episodes of intractable nausea vomiting/gastroparesis, history of marijuana abuse who presents the emergency department with nausea and vomiting.  Patient states that she has had chills over the past 2 days.  About 2 hours prior to arrival she began having diffuse abdominal pain and has had several episodes of clear vomitus followed by blood-tinged vomitus.  She denies coffee-ground emesis.  She denies diarrhea or constipation.  She denies Joneen Boers, urgency or foul odor of urine however she has had chronic hematuria and has a follow-up appointment next week with urology.  Patient complains of 10 out of 10 diffuse abdominal pain.  LMP was 10/19/2017.  HPI  Past Medical History:  Diagnosis Date  . Anxiety   . Arthritis    "hands, feet, knees" (12/18/2016)  . Asthma   . Diabetic gastroparesis ([place])    Per gastric emptying study 07/09/16 which showed significant delayed gastric emptying.  . Gallstones   . Gastroparesis   . GERD (gastroesophageal reflux disease)   . Heart murmur   . Hepatic steatosis 11/26/2014   and hepatomegaly  . Hypertension    hx (12/18/2016)  . Intractable cyclical vomiting syndrome    Archie Endo 12/18/2016  . Liver mass 11/26/2014  . Pancreatitis, acute 11/26/2014  . Pneumonia    "as a teen X 1" (12/18/2016)  . Type I diabetes mellitus ([place]) 2007   IDDM.  poorly controlled, multiple admits with DKA    Patient Active Problem List   Diagnosis Date Noted  . Nausea and vomiting 01/22/2017  . Nausea & vomiting 01/21/2017  . Intractable cyclical vomiting syndrome 12/18/2016  . Leukocytosis 10/24/2016  . N&V  (nausea and vomiting) 10/23/2016  . DKA, type 1 ([place]) 09/26/2016  . Thrombocytosis ([place]) 07/08/2016  . Candiduria, asymptomatic 06/23/2016  . Hyponatremia 06/13/2016  . Hematuria 05/13/2016  . UTI (urinary tract infection) 01/22/2016  . Diarrhea 11/09/2015  . Acute urinary retention   . GERD (gastroesophageal reflux disease) 07/10/2015  . Depression with anxiety 07/10/2015  . Gastroparesis 06/22/2015  . Altered mental status 06/22/2015  . Volume depletion 06/10/2015  . Protein-calorie malnutrition, severe 06/10/2015  . Hyperglycemia   . Elevated bilirubin   . Hematemesis with nausea   . Intractable nausea and vomiting 05/28/2015  . Abdominal pain in female   . Abdominal pain 05/24/2015  . Hypertension 05/24/2015  . Dehydration   . Diabetic gastroparesis ([place])   . Chronic diastolic heart failure ([place]) 04/11/2015  . Hematemesis 04/08/2015  . DKA (diabetic ketoacidoses) ([place]) 03/22/2015  . S/P laparoscopic cholecystectomy 02/11/2015  . Postextubation stridor   . Pancreatitis, acute 11/26/2014  . Volume overload 11/26/2014  . Hypokalemia 11/26/2014  . Hepatic steatosis 11/26/2014  . Liver mass 11/26/2014  . Sepsis ([place]) 11/25/2014  . Sinus tachycardia 11/25/2014  . Hypomagnesemia 11/25/2014  . Hypophosphatemia 11/25/2014  . Elevated LFTs 11/24/2014  . AKI (acute kidney injury) ([place]) 11/24/2014  . Migraine headache 11/24/2014  . Asthma 06/29/2012  . Uncontrolled type 1 diabetes mellitus ([place]) 06/19/2010  . Goiter, unspecified 06/19/2010    Past Surgical History:  Procedure Laterality Date  . CHOLECYSTECTOMY N/A 02/11/2015  Procedure: LAPAROSCOPIC CHOLECYSTECTOMY WITH INTRAOPERATIVE CHOLANGIOGRAM;  Surgeon: Greer Pickerel, MD;  Location: [facility] ORS;  Service: General;  Laterality: N/A;  . ESOPHAGOGASTRODUODENOSCOPY (EGD) WITH PROPOFOL Left 09/20/2014   Procedure: ESOPHAGOGASTRODUODENOSCOPY (EGD) WITH PROPOFOL;  Surgeon: Arta Silence, MD;  Location: [facility] ENDOSCOPY;  Service:  Endoscopy;  Laterality: Left;  . WISDOM TOOTH EXTRACTION       OB History    Gravida  2   Para  1   Term  0   Preterm  1   AB  1   Living  1     SAB  0   TAB  1   Ectopic  0   Multiple  0   Live Births  1            Home Medications    Prior to Admission medications   Medication Sig Start Date End Date Taking? Authorizing Provider  albuterol (PROVENTIL HFA;VENTOLIN HFA) 108 (90 Base) MCG/ACT inhaler Inhale 1-2 puffs into the lungs every 6 (six) hours as needed for wheezing or shortness of breath.    [provider]  cephALEXin (KEFLEX) 500 MG capsule Take 1 capsule (500 mg total) by mouth 4 (four) times daily. 06/10/17   Blanchie Dessert, MD  glucagon (GLUCAGON EMERGENCY) 1 MG injection Inject 1 mg into the muscle once as needed (For very low blood sugars. May repeat in 15-20 minutes if needed.). 12/20/16   Hongalgi, Lenis Dickinson, MD  insulin aspart (NOVOLOG FLEXPEN) 100 UNIT/ML FlexPen Inject 1-10 Units into the skin 3 (three) times daily with meals. Pt uses 1 unit for every 10 grams of carbs with meals and snacks for BS greater than 50. Sliding scale 1 units/50 when CBG > 150    [provider]  insulin glargine (LANTUS) 100 UNIT/ML injection Inject 0.25 mLs (25 Units total) into the skin daily. Patient not taking: Reported on 06/10/2017 01/26/17   Dessa Phi, DO  lisinopril (ZESTRIL) 2.5 MG tablet Take 2.5 mg by mouth daily. 02/11/17   [provider]  metoCLOPramide (REGLAN) 5 MG tablet Take 1 tablet (5 mg total) by mouth 3 (three) times daily before meals. Patient not taking: Reported on 06/10/2017 01/29/17   Domenic Polite, MD  metoprolol tartrate (LOPRESSOR) 25 MG tablet Take 0.5 tablets (12.5 mg total) by mouth 2 (two) times daily. Patient not taking: Reported on 06/10/2017 05/10/16   Mariel Aloe, MD  ondansetron (ZOFRAN) 4 MG tablet Take 1 tablet (4 mg total) by mouth every 8 (eight) hours as needed for nausea, vomiting or refractory  nausea / vomiting. Patient not taking: Reported on 06/10/2017 01/26/17   Dessa Phi, DO  oxybutynin (DITROPAN) 5 MG tablet Take 0.5 tablets (2.5 mg total) by mouth 3 (three) times daily. Patient not taking: Reported on 06/10/2017 10/29/16   Donne Hazel, MD  promethazine (PHENERGAN) 25 MG suppository Place 1 suppository (25 mg total) rectally every 6 (six) hours as needed for nausea or vomiting. Patient not taking: Reported on 06/10/2017 02/02/17   Jeannett Senior, PA-C  promethazine (PHENERGAN) 25 MG tablet Take 1 tablet (25 mg total) by mouth every 6 (six) hours as needed for nausea or vomiting. Patient not taking: Reported on 06/10/2017 02/02/17   Jeannett Senior, PA-C  RESTASIS MULTIDOSE 0.05 % ophthalmic emulsion Place 1 drop into both eyes every 12 (twelve) hours. 09/08/16   [provider]    Family History Family History  Problem Relation Age of Onset  . Heart disease Maternal Grandmother   .  Heart disease Maternal Grandfather   . Diabetes Mother   . Hyperlipidemia Mother   . Hypertension Father   . Heart disease Father   . Hypertension Paternal Grandmother   . Cancer Paternal Grandfather     Social History Social History   Tobacco Use  . Smoking status: Never Smoker  . Smokeless tobacco: Never Used  Substance Use Topics  . Alcohol use: No  . Drug use: Yes    Types: Marijuana    Comment: 12/18/2016 q few days"     Allergies   Peanut-containing drug products; Food; and Ultram [tramadol]   Review of Systems Review of Systems Ten systems reviewed and are negative for acute change, except as noted in the HPI.    Physical Exam Updated Vital Signs BP (!) 153/98 (BP Location: Left Arm)   Pulse (!) 134   Temp 98.3 F (36.8 C) (Oral)   Resp 19   Ht 5\' 3"  (1.6 m)   Wt 59 kg   LMP 10/19/2017   SpO2 100%   BMI 23.03 kg/m   Physical Exam  Constitutional: She is oriented to person, place, and time. She appears well-developed and well-nourished.  No distress.  HENT:  Head: Normocephalic and atraumatic.  Eyes: Conjunctivae are normal. No scleral icterus.  Neck: Normal range of motion.  Cardiovascular: Regular rhythm and normal heart sounds. Exam reveals no gallop and no friction rub.  No murmur heard. Tachycardia   Pulmonary/Chest: Effort normal and breath sounds normal. No respiratory distress.  Abdominal: Soft. Bowel sounds are normal. She exhibits no distension and no mass. There is generalized tenderness. There is no guarding and no CVA tenderness.  Neurological: She is alert and oriented to person, place, and time.  Skin: Skin is warm and dry. She is not diaphoretic.  Psychiatric: Her behavior is normal.  Nursing note and vitals reviewed.    ED Treatments / Results  Labs (all labs ordered are listed, but only abnormal results are displayed) Labs Reviewed  LIPASE, BLOOD  COMPREHENSIVE METABOLIC PANEL  CBC  URINALYSIS, ROUTINE W REFLEX MICROSCOPIC  RAPID URINE DRUG SCREEN, HOSP PERFORMED  I-STAT BETA HCG BLOOD, ED ([facility], [facility], [facility] ONLY)    EKG None   ED ECG REPORT   Date: 11/03/2017  Rate: 99j  Rhythm: normal sinus rhythm  QRS Axis: normal  Intervals: normal  ST/T Wave abnormalities: normal  Conduction Disutrbances:none  Narrative Interpretation:   Old EKG Reviewed: unchanged  I have personally reviewed the EKG tracing and agree with the computerized printout as noted.   Radiology No results found.  Procedures .Critical Care Performed by: Margarita Mail, PA-C Authorized by: Margarita Mail, PA-C   Critical care provider statement:    Critical care time (minutes):  45   Critical care was necessary to treat or prevent imminent or life-threatening deterioration of the following conditions:  Metabolic crisis   Critical care was time spent personally by me on the following activities:  Discussions with consultants, evaluation of patient's response to treatment, examination of patient, ordering and  performing treatments and interventions, ordering and review of laboratory studies, ordering and review of radiographic studies, pulse oximetry, re-evaluation of patient's condition, obtaining history from patient or surrogate and review of old charts   (including critical care time)  Medications Ordered in ED Medications  haloperidol lactate (HALDOL) injection 2 mg (has no administration in time range)  lactated ringers bolus 2,000 mL (has no administration in time range)  ondansetron (ZOFRAN) injection 4 mg (4 mg Intravenous  Given 11/02/17 1336)     Initial Impression / Assessment and Plan / ED Course  I have reviewed the triage vital signs and the nursing notes.  Pertinent labs & imaging results that were available during my care of the patient were reviewed by me and considered in my medical decision making (see chart for details).    27 year old female who presents with intractable nausea and vomiting.  Tachycardic and hypertensive at arrival.  She was given Haldol and 2 L of LR along with morphine with significant improvement in her pain and vomiting although not fully resolved.  This lasted about 2 hours however the patient had return of her severe pain and intractable vomiting.  Labs showed sodium of 147 suggestive of hypovolemia.  White count normal.  Her urine is pending.  Blood sugar is elevated however she does not have an anion gap and there is no sign of metabolic acidosis or DKA.  I discussed the case with Dr. Aileen Fass who will admit the patient.  The patient denies current marijuana abuse.  She will be admitted to the hospital.   Final Clinical Impressions(s) / ED Diagnoses   Final diagnoses:  Intractable nausea and vomiting  Hypernatremia    ED Discharge Orders    None       Margarita Mail, PA-C 11/03/17 1328    Maudie Flakes, MD 11/03/17 [place], [place], PA-C 11/23/17 1214    Maudie Flakes, MD 11/23/17 [phone]

## 2017-11-03 LAB — COMPREHENSIVE METABOLIC PANEL
ALBUMIN: 3.6 g/dL (ref 3.5–5.0)
ALK PHOS: 68 U/L (ref 38–126)
ALT: 13 U/L (ref 0–44)
ANION GAP: 12 (ref 5–15)
AST: 17 U/L (ref 15–41)
BUN: 18 mg/dL (ref 6–20)
CO2: 11 mmol/L — AB (ref 22–32)
Calcium: 8 mg/dL — ABNORMAL LOW (ref 8.9–10.3)
Chloride: 116 mmol/L — ABNORMAL HIGH (ref 98–111)
Creatinine, Ser: 0.61 mg/dL (ref 0.44–1.00)
GFR calc Af Amer: >60 mL/min (ref >60)
GFR calc non Af Amer: >60 mL/min (ref >60)
GLUCOSE: 205 mg/dL — AB (ref 70–99)
POTASSIUM: 4 mmol/L (ref 3.5–5.1)
SODIUM: 139 mmol/L (ref 135–145)
Total Bilirubin: 0.9 mg/dL (ref 0.3–1.2)
Total Protein: 6.6 g/dL (ref 6.5–8.1)

## 2017-11-03 LAB — URINALYSIS, ROUTINE W REFLEX MICROSCOPIC
Bilirubin Urine: NEGATIVE
Ketones, ur: 80 mg/dL — AB
Nitrite: NEGATIVE
PH: 6 (ref 5.0–8.0)
Protein, ur: 100 mg/dL — AB
SPECIFIC GRAVITY, URINE: 1.02 (ref 1.005–1.030)

## 2017-11-03 LAB — CBC
HEMATOCRIT: 35.4 % — AB (ref 36.0–46.0)
HEMOGLOBIN: 11 g/dL — AB (ref 12.0–15.0)
MCH: 29.1 pg (ref 26.0–34.0)
MCHC: 31.1 g/dL (ref 30.0–36.0)
MCV: 93.7 fL (ref 80.0–100.0)
NRBC: 0 % (ref 0.0–0.2)
Platelets: 325 10*3/uL (ref 150–400)
RBC: 3.78 MIL/uL — ABNORMAL LOW (ref 3.87–5.11)
RDW: 13.6 % (ref 11.5–15.5)
WBC: 13.5 10*3/uL — AB (ref 4.0–10.5)

## 2017-11-03 LAB — RAPID URINE DRUG SCREEN, HOSP PERFORMED
AMPHETAMINES: NOT DETECTED
Barbiturates: NOT DETECTED
Benzodiazepines: NOT DETECTED
Cocaine: NOT DETECTED
OPIATES: NOT DETECTED
TETRAHYDROCANNABINOL: POSITIVE — AB

## 2017-11-03 LAB — GLUCOSE, CAPILLARY
GLUCOSE-CAPILLARY: 192 mg/dL — AB (ref 70–99)
GLUCOSE-CAPILLARY: 226 mg/dL — AB (ref 70–99)
GLUCOSE-CAPILLARY: 251 mg/dL — AB (ref 70–99)
GLUCOSE-CAPILLARY: 287 mg/dL — AB (ref 70–99)
GLUCOSE-CAPILLARY: 302 mg/dL — AB (ref 70–99)
Glucose-Capillary: 279 mg/dL — ABNORMAL HIGH (ref 70–99)
Glucose-Capillary: 286 mg/dL — ABNORMAL HIGH (ref 70–99)
Glucose-Capillary: 191 mg/dL — ABNORMAL HIGH (ref 70–99)
Glucose-Capillary: 201 mg/dL — ABNORMAL HIGH (ref 70–99)

## 2017-11-03 LAB — HIV ANTIBODY (ROUTINE TESTING W REFLEX): HIV SCREEN 4TH GENERATION: NONREACTIVE

## 2017-11-03 MED ORDER — MORPHINE SULFATE (PF) 2 MG/ML IV SOLN
2.0000 mg | INTRAVENOUS | Status: DC | PRN
  Administered 2017-11-03 – 2017-11-04 (×9): 2 mg via INTRAVENOUS
  Filled 2017-11-03 (×10): qty 1

## 2017-11-03 MED ORDER — SODIUM CHLORIDE 0.9 % IV BOLUS
1000.0000 mL | Freq: Once | INTRAVENOUS | Status: AC
  Administered 2017-11-03: 1000 mL via INTRAVENOUS

## 2017-11-03 MED ORDER — PROMETHAZINE HCL 25 MG/ML IJ SOLN
25.0000 mg | Freq: Four times a day (QID) | INTRAMUSCULAR | Status: DC | PRN
  Administered 2017-11-03 – 2017-11-05 (×9): 25 mg via INTRAVENOUS
  Filled 2017-11-03 (×11): qty 1

## 2017-11-03 MED ORDER — METOCLOPRAMIDE HCL 5 MG/ML IJ SOLN
10.0000 mg | Freq: Four times a day (QID) | INTRAMUSCULAR | Status: DC
  Administered 2017-11-03 – 2017-11-06 (×11): 10 mg via INTRAVENOUS
  Filled 2017-11-03 (×12): qty 2

## 2017-11-03 MED ORDER — SODIUM BICARBONATE 8.4 % IV SOLN
INTRAVENOUS | Status: DC
  Administered 2017-11-03 – 2017-11-06 (×8): via INTRAVENOUS
  Filled 2017-11-03 (×3): qty 150
  Filled 2017-11-03: qty 100
  Filled 2017-11-03 (×5): qty 150

## 2017-11-03 MED ORDER — INSULIN GLARGINE 100 UNIT/ML ~~LOC~~ SOLN
10.0000 [IU] | Freq: Two times a day (BID) | SUBCUTANEOUS | Status: DC
  Administered 2017-11-03: 10 [IU] via SUBCUTANEOUS
  Filled 2017-11-03 (×2): qty 0.1

## 2017-11-03 NOTE — Progress Notes (Signed)
Inpatient Diabetes Program Recommendations  AACE/ADA: New Consensus Statement on Inpatient Glycemic Control (2015)  Target Ranges:  Prepandial:   less than 140 mg/dL      Peak postprandial:   less than 180 mg/dL (1-2 hours)      Critically ill patients:  140 - 180 mg/dL   Results for [name] (MRN [id number]) as of 11/03/2017 10:04  Ref. Range 11/02/2017 14:21 11/02/2017 15:09 11/02/2017 16:19 11/02/2017 21:23 11/02/2017 21:26  Glucose-Capillary Latest Ref Range: 70 - 99 mg/dL 65 (L)  25 ml D50% 94 171 (H) 279 (H) 286 (H)  5 units NOVOLOG +  5 units LANTUS   Results for [name] (MRN [id number]) as of 11/03/2017 10:04  Ref. Range 11/03/2017 00:04 11/03/2017 03:53 11/03/2017 07:49  Glucose-Capillary Latest Ref Range: 70 - 99 mg/dL 226 (H)  3 units NOVOLOG  191 (H)  2 units NOVOLOG  201 (H)  3 units NOVOLOG    Results for [name] (MRN [id number]) as of 11/03/2017 10:04  Ref. Range 01/22/2017 02:44 11/02/2017 18:31  Hemoglobin A1C Latest Ref Range: 4.8 - 5.6 % 9.3 (H) 8.1 (H)    Admit with: Intractable nausea and vomiting likely due to gastroparesis setting of cannabis use  History: Type 1 DM, Gastroparesis  Home DM Meds: Lantus 25 units Daily       Novolog 1 unit for every 10 grams Carbohydrates       Novolog 1 unit for every 50 mg/dl >150 mg/dl       Insulin Pump  Current Orders: Lantus 5 units BID      Novolog Sensitive Correction Scale/ SSI (0-9 units) Q4 hours      MD- Would you consider increasing Lantus to 10 units BID?  Thsi would be about 70% of her total basal insulin dose she gets in a 24 hour period on her insulin pump.    Endocrinologist: Dr. Francetta Found ([facility])--Last seen in office 06/02/2017.  At that visit, patient was using Insulin Pump at home.  Basal rates were reduced to 1.4 units/hour and Carb Ratio was changed to 1:10 (from 1:12) at that visit.  Pt also advised to Use square-wave boluses up  to 2 hours duration to cover delayed absorption from your gastroparesis.  Concern about pt's 7am BMET today.  CO2 was down to 11.  Note that IVF changed to Sodium Bicarb at 125 cc/hr.  Met with pt earlier this AM.  Pt was very restless and nauseated.  Was not able to tell me any of her exact insulin pump settings.  Told me she took the pump off sometime last night (does not remember the time).  Removed her insulin pump insertion site as well.  Does not have extra insulin pump supplies with her and said she doesn't have anyone who can bring more to her to the hospital.    Through review of Care Everywhere, it looks as if pt's last Insulin Pump settings are as follows: Basal Rate: 1.4 units/hour Total Basal insulin per 24 hour period= 33.6 units Insulin to Carb Ratio- 1 unit Novolog for every 10 grams of Carbohydrates Insulin Sensitivity- 1 unit Novolog for every 50 mg/dl > Target CBG of 150 mg/dl  Have placed call to pt's ENDO office.  Waiting on call back.  Trying to ascertain pt's exact insulin pump settings as pt could not verbalize to me today what her pump settings are.    --Will follow patient during hospitalization--  Wyn Quaker RN, MSN, CDE  Diabetes Coordinator Inpatient Glycemic Control Team Team Pager: [phone] (8a-5p)

## 2017-11-03 NOTE — Progress Notes (Signed)
Pt is known to be tachycardic d/t dehydration - continue to monitor

## 2017-11-03 NOTE — Progress Notes (Signed)
PROGRESS NOTE    [name]   VHQ:[id number]  DOB: [birth year]  DOA: 11/02/2017 PCP: Vicenta Aly, FNP   Brief Narrative:  [name]  is a 27 y.o. female with medical history significant of diabetes mellitus type 1 essential hypertension, gastroparesis noncompliant with her medication and past medical history of drug abuse marijuana whose had multiple admission for gastroparesis in the setting of marijuana use comes in for nausea and vomiting that started on the day of admission progressively getting worse unable to keep anything down since this morning.  She relates she use cannabis over the weekend, she also relates abdominal pain that started this afternoon.  She denies any fevers, chills, chest pain, shortness of breath, no recent procedures, no change in her medications, no cuts or sores    Subjective: Ongoing vomiting and severe generalized abdominal pain.    Assessment & Plan:   Principal Problem:   Intractable nausea and vomiting- likely gastroparesis - add IV Phernergan, increase IV Reglan to QID - add IV Dilaudid - start clears once symptoms abate  Active Problems:   Uncontrolled type 1 diabetes mellitus  - can increase lantus to 10 mg BID as sugars elevated  Metabolic acidosis - normal anion gap- likely has lactic and starvation acidosis - start Bicarb drip with D5    Sinus tachycardia - due to ongoing pain/ distress    Protein-calorie malnutrition, severe   Hypernatremia   DVT prophylaxis: SCDs Code Status: Full code Family Communication:  Disposition Plan: home when able to tolerate orals Consultants:   none Procedures:   none Antimicrobials:  Anti-infectives (From admission, onward)   None       Objective: Vitals:   11/02/17 2148 11/03/17 0507 11/03/17 1154 11/03/17 1239  BP: 137/86 140/78  130/66  Pulse: (!) 120 (!) 132  (!) 133  Resp: 16 16  14   Temp: 98.5 F (36.9 C) 98.9 F (37.2 C)  99.4 F (37.4 C)  TempSrc:  Oral Oral  Oral  SpO2: 100% 99% 99% 100%  Weight:      Height:        Intake/Output Summary (Last 24 hours) at 11/03/2017 1530 Last data filed at 11/03/2017 1243 Gross per 24 hour  Intake 3531.08 ml  Output 350 ml  Net 3181.08 ml   Filed Weights   11/02/17 1309  Weight: 59 kg    Examination: General exam: Appears very uncomfortable  HEENT: PERRLA, oral mucosa moist, no sclera icterus or thrush Respiratory system: Clear to auscultation. Respiratory effort normal. Cardiovascular system: S1 & S2 heard, RRR.   Gastrointestinal system: Abdomen soft, diffusely tender, nondistended. Normal bowel sound. No organomegaly Central nervous system: Alert and oriented. No focal neurological deficits. Extremities: No cyanosis, clubbing or edema Skin: No rashes or ulcers Psychiatry:  Very restless due to pain    Data Reviewed: I have personally reviewed following labs and imaging studies  CBC: Recent Labs  Lab 11/02/17 1332 11/03/17 0704  WBC 9.7 13.5*  HGB 13.9 11.0*  HCT 43.2 35.4*  MCV 87.3 93.7  PLT 419* 413   Basic Metabolic Panel: Recent Labs  Lab 11/02/17 1332 11/02/17 1838 11/03/17 0704  NA 147*  --  139  K 3.5  --  4.0  CL 108  --  116*  CO2 27  --  11*  GLUCOSE 82  --  205*  BUN 20  --  18  CREATININE 0.68  --  0.61  CALCIUM 10.4*  --  8.0*  MG  --  1.7  --    GFR: Estimated Creatinine Clearance: 86.6 mL/min (by C-G formula based on SCr of 0.61 mg/dL). Liver Function Tests: Recent Labs  Lab 11/02/17 1332 11/03/17 0704  AST 19 17  ALT 13 13  ALKPHOS 85 68  BILITOT 0.8 0.9  PROT 8.5* 6.6  ALBUMIN 4.7 3.6   Recent Labs  Lab 11/02/17 1332 11/02/17 1838  LIPASE 24 22   No results for input(s): AMMONIA in the last 168 hours. Coagulation Profile: No results for input(s): INR, PROTIME in the last 168 hours. Cardiac Enzymes: No results for input(s): CKTOTAL, CKMB, CKMBINDEX, TROPONINI in the last 168 hours. BNP (last 3 results) No results for  input(s): PROBNP in the last 8760 hours. HbA1C: Recent Labs    11/02/17 1831  HGBA1C 8.1*   CBG: Recent Labs  Lab 11/02/17 2126 11/03/17 0004 11/03/17 0353 11/03/17 0749 11/03/17 1136  GLUCAP 286* 226* 191* 201* 192*   Lipid Profile: No results for input(s): CHOL, HDL, LDLCALC, TRIG, CHOLHDL, LDLDIRECT in the last 72 hours. Thyroid Function Tests: No results for input(s): TSH, T4TOTAL, FREET4, T3FREE, THYROIDAB in the last 72 hours. Anemia Panel: No results for input(s): VITAMINB12, FOLATE, FERRITIN, TIBC, IRON, RETICCTPCT in the last 72 hours. Urine analysis:    Component Value Date/Time   COLORURINE YELLOW 11/03/2017 1149   APPEARANCEUR CLOUDY (A) 11/03/2017 1149   LABSPEC 1.020 11/03/2017 1149   PHURINE 6.0 11/03/2017 1149   GLUCOSEU >=500 (A) 11/03/2017 1149   GLUCOSEU >=1000 11/07/2012 1205   HGBUR LARGE (A) 11/03/2017 1149   BILIRUBINUR NEGATIVE 11/03/2017 1149   KETONESUR 80 (A) 11/03/2017 1149   PROTEINUR 100 (A) 11/03/2017 1149   UROBILINOGEN 0.2 11/24/2014 1045   NITRITE NEGATIVE 11/03/2017 1149   LEUKOCYTESUR MODERATE (A) 11/03/2017 1149   Sepsis Labs: @LABRCNTIP (procalcitonin:4,lacticidven:4) )No results found for this or any previous visit (from the past 240 hour(s)).       Radiology Studies: No results found.    Scheduled Meds: . insulin aspart  0-9 Units Subcutaneous Q4H  . insulin glargine  5 Units Subcutaneous BID  . metoCLOPramide (REGLAN) injection  10 mg Intravenous Q6H   Continuous Infusions: . famotidine (PEPCID) IV 20 mg (11/03/17 1114)  .  sodium bicarbonate  infusion 1000 mL 125 mL/hr at 11/03/17 1219     LOS: 1 day    Time spent in minutes: 35    Debbe Odea, MD [facility] Pager: [email or web address] Password [email or web address] 11/03/2017, 3:30 PM

## 2017-11-03 NOTE — Progress Notes (Signed)
Lamar Blinks, NP notified of pt's HR increased >125 earlier and 1L bolus given over 1 hr. Pt now has sustained HR of 135 bpm. Lamar Blinks, NP paged and notified. Awaiting new orders. Pt treated for nausea and vomiting throughout the night with all available prns. Ice chips provided per provider orders, but pt vomited up all fluids.

## 2017-11-04 LAB — BASIC METABOLIC PANEL
ANION GAP: 9 (ref 5–15)
BUN: 13 mg/dL (ref 6–20)
CALCIUM: 8.9 mg/dL (ref 8.9–10.3)
CO2: 22 mmol/L (ref 22–32)
CREATININE: 0.69 mg/dL (ref 0.44–1.00)
Chloride: 110 mmol/L (ref 98–111)
GLUCOSE: 245 mg/dL — AB (ref 70–99)
Potassium: 3.6 mmol/L (ref 3.5–5.1)
Sodium: 141 mmol/L (ref 135–145)

## 2017-11-04 LAB — CBC
HCT: 35.4 % — ABNORMAL LOW (ref 36.0–46.0)
Hemoglobin: 11.5 g/dL — ABNORMAL LOW (ref 12.0–15.0)
MCH: 28.8 pg (ref 26.0–34.0)
MCHC: 32.5 g/dL (ref 30.0–36.0)
MCV: 88.7 fL (ref 80.0–100.0)
PLATELETS: 310 10*3/uL (ref 150–400)
RBC: 3.99 MIL/uL (ref 3.87–5.11)
RDW: 13.6 % (ref 11.5–15.5)
WBC: 12.6 10*3/uL — ABNORMAL HIGH (ref 4.0–10.5)
nRBC: 0 % (ref 0.0–0.2)

## 2017-11-04 LAB — GLUCOSE, CAPILLARY
GLUCOSE-CAPILLARY: 220 mg/dL — AB (ref 70–99)
GLUCOSE-CAPILLARY: 224 mg/dL — AB (ref 70–99)
Glucose-Capillary: 218 mg/dL — ABNORMAL HIGH (ref 70–99)
Glucose-Capillary: 233 mg/dL — ABNORMAL HIGH (ref 70–99)
Glucose-Capillary: 214 mg/dL — ABNORMAL HIGH (ref 70–99)

## 2017-11-04 MED ORDER — INSULIN ASPART 100 UNIT/ML ~~LOC~~ SOLN
0.0000 [IU] | Freq: Three times a day (TID) | SUBCUTANEOUS | Status: DC
  Administered 2017-11-04 – 2017-11-05 (×3): 3 [IU] via SUBCUTANEOUS
  Administered 2017-11-05: 2 [IU] via SUBCUTANEOUS

## 2017-11-04 MED ORDER — INSULIN GLARGINE 100 UNIT/ML ~~LOC~~ SOLN
15.0000 [IU] | Freq: Two times a day (BID) | SUBCUTANEOUS | Status: DC
  Administered 2017-11-04 (×2): 15 [IU] via SUBCUTANEOUS
  Filled 2017-11-04 (×3): qty 0.15

## 2017-11-04 NOTE — Progress Notes (Signed)
Patient stated to RN that her mother was going to bring her insulin pump supplies after 6:30pm today. MD made aware. MD stated to RN for patient to wait to restart insulin pump until she rounds in the morning d/t patient already received Lantus dose today and has had very little PO intake. Patient updated on MD plan of care. Also, RN to hold 10/18 Lantus dose until discuss plan of care with MD, in case the insulin pump is restarted. Will pass on to night shift RN to relay to 10/18 day shift RN.   Levi Klaiber, Fraser Din 11/04/2017 5:41 PM

## 2017-11-04 NOTE — Progress Notes (Signed)
Spoke w/ pt endocrinologist Dr. Francetta Found with [facility] late yesterday afternoon.  Dr. Hartford Poli told me he has not seen [name] since May office visit.  No Insulin pump changes have been made to her insulin pump since that visit.  Pump Settings as of May 2019 are: Basal Rate: 1.4 units/hour Total Basal insulin per 24 hour period= 33.6 units Insulin to Carb Ratio- 1 unit Novolog for every 10 grams of Carbohydrates Insulin Sensitivity- 1 unit Novolog for every 50 mg/dl > Target CBG of 150 mg/dl    --Will follow patient during hospitalization--  Wyn Quaker RN, MSN, CDE Diabetes Coordinator Inpatient Glycemic Control Team Team Pager: [phone] (8a-5p)

## 2017-11-04 NOTE — Progress Notes (Signed)
PROGRESS NOTE    [name]   VVO:[id number]  DOB: 03-Apr-1990  DOA: 11/02/2017 PCP: Vicenta Aly, FNP   Brief Narrative:  [name]  is a 27 y.o. female with medical history significant of diabetes mellitus type 1 essential hypertension, gastroparesis noncompliant with her medication and past medical history of drug abuse marijuana whose had multiple admission for gastroparesis in the setting of marijuana use comes in for nausea and vomiting that started on the day of admission progressively getting worse unable to keep anything down since this morning.  She relates she use cannabis over the weekend, she also relates abdominal pain that started this afternoon.  She denies any fevers, chills, chest pain, shortness of breath, no recent procedures, no change in her medications, no cuts or sores    Subjective: Ongoing nausea but vomiting has slowed down.    Assessment & Plan:   Principal Problem:   Intractable nausea and vomiting- likely gastroparesis - add IV Phernergan, increase IV Reglan to QID - add IV Dilaudid - start sips of clears today  Active Problems:   Uncontrolled type 1 diabetes mellitus  -   increase lantus to 15 mg BID as sugars elevated - once she is able to obtain her supplies, we can resume her insulin pump  Metabolic acidosis/ dehydration - normal anion gap- likely has lactic and starvation acidosis - started Bicarb drip with D5    Sinus tachycardia - due to ongoing pain/ distress    Protein-calorie malnutrition, severe     DVT prophylaxis: SCDs Code Status: Full code Family Communication:  Disposition Plan: home when able to tolerate orals Consultants:   none Procedures:   none Antimicrobials:  Anti-infectives (From admission, onward)   None       Objective: Vitals:   11/03/17 1154 11/03/17 1239 11/03/17 2038 11/04/17 0410  BP:  130/66 125/65 (!) 149/89  Pulse:  (!) 133 (!) 136 (!) 125  Resp:  14 18 16   Temp:  99.4  F (37.4 C) 99.5 F (37.5 C) 99.6 F (37.6 C)  TempSrc:  Oral Oral Oral  SpO2: 99% 100% 100% 100%  Weight:      Height:        Intake/Output Summary (Last 24 hours) at 11/04/2017 1322 Last data filed at 11/04/2017 1149 Gross per 24 hour  Intake 703.66 ml  Output 2100 ml  Net -1396.34 ml   Filed Weights   11/02/17 1309  Weight: 59 kg    Examination: General exam: Appears comfortable  HEENT: PERRLA, oral mucosa moist, no sclera icterus or thrush Respiratory system: Clear to auscultation. Respiratory effort normal. Cardiovascular system: S1 & S2 heard,  No murmurs  Gastrointestinal system: Abdomen soft, diffusely tender still, nondistended. Normal bowel sound. No organomegaly Central nervous system: Alert and oriented. No focal neurological deficits. Extremities: No cyanosis, clubbing or edema Skin: No rashes or ulcers Psychiatry:  Mood & affect appropriate.     Data Reviewed: I have personally reviewed following labs and imaging studies  CBC: Recent Labs  Lab 11/02/17 1332 11/03/17 0704 11/04/17 0549  WBC 9.7 13.5* 12.6*  HGB 13.9 11.0* 11.5*  HCT 43.2 35.4* 35.4*  MCV 87.3 93.7 88.7  PLT 419* 325 269   Basic Metabolic Panel: Recent Labs  Lab 11/02/17 1332 11/02/17 1838 11/03/17 0704 11/04/17 0549  NA 147*  --  139 141  K 3.5  --  4.0 3.6  CL 108  --  116* 110  CO2 27  --  11* 22  GLUCOSE 82  --  205* 245*  BUN 20  --  18 13  CREATININE 0.68  --  0.61 0.69  CALCIUM 10.4*  --  8.0* 8.9  MG  --  1.7  --   --    GFR: Estimated Creatinine Clearance: 86.6 mL/min (by C-G formula based on SCr of 0.69 mg/dL). Liver Function Tests: Recent Labs  Lab 11/02/17 1332 11/03/17 0704  AST 19 17  ALT 13 13  ALKPHOS 85 68  BILITOT 0.8 0.9  PROT 8.5* 6.6  ALBUMIN 4.7 3.6   Recent Labs  Lab 11/02/17 1332 11/02/17 1838  LIPASE 24 22   No results for input(s): AMMONIA in the last 168 hours. Coagulation Profile: No results for input(s): INR, PROTIME in  the last 168 hours. Cardiac Enzymes: No results for input(s): CKTOTAL, CKMB, CKMBINDEX, TROPONINI in the last 168 hours. BNP (last 3 results) No results for input(s): PROBNP in the last 8760 hours. HbA1C: Recent Labs    11/02/17 1831  HGBA1C 8.1*   CBG: Recent Labs  Lab 11/03/17 2035 11/03/17 2345 11/04/17 0407 11/04/17 0743 11/04/17 1140  GLUCAP 302* 251* 218* 220* 224*   Lipid Profile: No results for input(s): CHOL, HDL, LDLCALC, TRIG, CHOLHDL, LDLDIRECT in the last 72 hours. Thyroid Function Tests: No results for input(s): TSH, T4TOTAL, FREET4, T3FREE, THYROIDAB in the last 72 hours. Anemia Panel: No results for input(s): VITAMINB12, FOLATE, FERRITIN, TIBC, IRON, RETICCTPCT in the last 72 hours. Urine analysis:    Component Value Date/Time   COLORURINE YELLOW 11/03/2017 1149   APPEARANCEUR CLOUDY (A) 11/03/2017 1149   LABSPEC 1.020 11/03/2017 1149   PHURINE 6.0 11/03/2017 1149   GLUCOSEU >=500 (A) 11/03/2017 1149   GLUCOSEU >=1000 11/07/2012 1205   HGBUR LARGE (A) 11/03/2017 1149   BILIRUBINUR NEGATIVE 11/03/2017 1149   KETONESUR 80 (A) 11/03/2017 1149   PROTEINUR 100 (A) 11/03/2017 1149   UROBILINOGEN 0.2 11/24/2014 1045   NITRITE NEGATIVE 11/03/2017 1149   LEUKOCYTESUR MODERATE (A) 11/03/2017 1149   Sepsis Labs: @LABRCNTIP (procalcitonin:4,lacticidven:4) )No results found for this or any previous visit (from the past 240 hour(s)).       Radiology Studies: No results found.    Scheduled Meds: . insulin aspart  0-9 Units Subcutaneous TID WC  . insulin glargine  15 Units Subcutaneous BID  . metoCLOPramide (REGLAN) injection  10 mg Intravenous Q6H   Continuous Infusions: . famotidine (PEPCID) IV 20 mg (11/04/17 0834)  .  sodium bicarbonate  infusion 1000 mL 125 mL/hr at 11/04/17 1249     LOS: 2 days    Time spent in minutes: 35    Debbe Odea, MD [facility] Pager: [email or web address] Password [email or web address] 11/04/2017, 1:22 PM

## 2017-11-05 LAB — BASIC METABOLIC PANEL
Anion gap: 10 (ref 5–15)
BUN: 9 mg/dL (ref 6–20)
CHLORIDE: 100 mmol/L (ref 98–111)
CO2: 28 mmol/L (ref 22–32)
CREATININE: 0.6 mg/dL (ref 0.44–1.00)
Calcium: 8.4 mg/dL — ABNORMAL LOW (ref 8.9–10.3)
GFR calc non Af Amer: >60 mL/min (ref >60)
Glucose, Bld: 257 mg/dL — ABNORMAL HIGH (ref 70–99)
Potassium: 3.2 mmol/L — ABNORMAL LOW (ref 3.5–5.1)
Sodium: 138 mmol/L (ref 135–145)

## 2017-11-05 LAB — GLUCOSE, CAPILLARY
Glucose-Capillary: 185 mg/dL — ABNORMAL HIGH (ref 70–99)
Glucose-Capillary: 220 mg/dL — ABNORMAL HIGH (ref 70–99)
Glucose-Capillary: 227 mg/dL — ABNORMAL HIGH (ref 70–99)
Glucose-Capillary: 236 mg/dL — ABNORMAL HIGH (ref 70–99)
Glucose-Capillary: 248 mg/dL — ABNORMAL HIGH (ref 70–99)

## 2017-11-05 MED ORDER — POTASSIUM CHLORIDE CRYS ER 20 MEQ PO TBCR
40.0000 meq | EXTENDED_RELEASE_TABLET | ORAL | Status: AC
  Administered 2017-11-05 (×2): 40 meq via ORAL
  Filled 2017-11-05 (×2): qty 2

## 2017-11-05 MED ORDER — MORPHINE SULFATE (PF) 2 MG/ML IV SOLN
2.0000 mg | Freq: Four times a day (QID) | INTRAVENOUS | Status: DC | PRN
  Administered 2017-11-05 – 2017-11-06 (×5): 2 mg via INTRAVENOUS
  Filled 2017-11-05 (×5): qty 1

## 2017-11-05 MED ORDER — INSULIN PUMP
Freq: Three times a day (TID) | SUBCUTANEOUS | Status: DC
  Administered 2017-11-05 – 2017-11-07 (×5): via SUBCUTANEOUS
  Filled 2017-11-05: qty 1

## 2017-11-05 NOTE — Plan of Care (Signed)
Pt still having periods of emesis.

## 2017-11-05 NOTE — Progress Notes (Signed)
PROGRESS NOTE    [name]   [id number]  DOB: Jun 06, 1990  DOA: 11/02/2017 PCP: Vicenta Aly, FNP   Brief Narrative:  [name]  is a 27 y.o. female with medical history significant of diabetes mellitus type 1 essential hypertension, gastroparesis noncompliant with her medication and past medical history of drug abuse marijuana whose had multiple admission for gastroparesis in the setting of marijuana use comes in for nausea and vomiting that started on the day of admission progressively getting worse unable to keep anything down since this morning.  She relates she use cannabis over the weekend, she also relates abdominal pain that started this afternoon.  She denies any fevers, chills, chest pain, shortness of breath, no recent procedures, no change in her medications, no cuts or sores    Subjective: Nausea improving- still barely eating. Agrees to advance to solids.    Assessment & Plan:   Principal Problem:   Intractable nausea and vomiting- likely gastroparesis - added IV Phernergan, increased IV Reglan to QID - added IV Dilaudid -  Advance to soft diet today  Active Problems:   Uncontrolled type 1 diabetes mellitus  -   increase lantus to 15 mg BID as sugars were elevated -  resume her insulin pump today- stop Lantus  Metabolic acidosis/ dehydration - normal anion gap- likely has lactic and starvation acidosis - started Bicarb drip with D5  Hypokalemia - replace    Sinus tachycardia - due to ongoing pain/ distress    Protein-calorie malnutrition, severe     DVT prophylaxis: SCDs Code Status: Full code Family Communication:  Disposition Plan: home when able to tolerate orals Consultants:   none Procedures:   none Antimicrobials:  Anti-infectives (From admission, onward)   None       Objective: Vitals:   11/04/17 1459 11/04/17 1711 11/04/17 2032 11/05/17 0529  BP: (!) 153/106 (!) 149/103 (!) 149/101 (!) 143/96  Pulse: (!)  120 (!) 117 (!) 118 (!) 106  Resp:   16 16  Temp:   99.5 F (37.5 C) 99.7 F (37.6 C)  TempSrc:   Oral Oral  SpO2: 100%  99% 100%  Weight:      Height:        Intake/Output Summary (Last 24 hours) at 11/05/2017 1450 Last data filed at 11/05/2017 1100 Gross per 24 hour  Intake 3095 ml  Output 1300 ml  Net 1795 ml   Filed Weights   11/02/17 1309  Weight: 59 kg    Examination: General exam: Appears comfortable  HEENT: PERRLA, oral mucosa moist, no sclera icterus or thrush Respiratory system: Clear to auscultation. Respiratory effort normal. Cardiovascular system: S1 & S2 heard,  No murmurs  Gastrointestinal system: Abdomen soft, diffusely tender still, nondistended. Normal bowel sound. No organomegaly Central nervous system: Alert and oriented. No focal neurological deficits. Extremities: No cyanosis, clubbing or edema Skin: No rashes or ulcers Psychiatry:  Mood & affect appropriate.     Data Reviewed: I have personally reviewed following labs and imaging studies  CBC: Recent Labs  Lab 11/02/17 1332 11/03/17 0704 11/04/17 0549  WBC 9.7 13.5* 12.6*  HGB 13.9 11.0* 11.5*  HCT 43.2 35.4* 35.4*  MCV 87.3 93.7 88.7  PLT 419* 325 440   Basic Metabolic Panel: Recent Labs  Lab 11/02/17 1332 11/02/17 1838 11/03/17 0704 11/04/17 0549 11/05/17 0544  NA 147*  --  139 141 138  K 3.5  --  4.0 3.6 3.2*  CL 108  --  116* 110 100  CO2 27  --  11* 22 28  GLUCOSE 82  --  205* 245* 257*  BUN 20  --  18 13 9   CREATININE 0.68  --  0.61 0.69 0.60  CALCIUM 10.4*  --  8.0* 8.9 8.4*  MG  --  1.7  --   --   --    GFR: Estimated Creatinine Clearance: 86.6 mL/min (by C-G formula based on SCr of 0.6 mg/dL). Liver Function Tests: Recent Labs  Lab 11/02/17 1332 11/03/17 0704  AST 19 17  ALT 13 13  ALKPHOS 85 68  BILITOT 0.8 0.9  PROT 8.5* 6.6  ALBUMIN 4.7 3.6   Recent Labs  Lab 11/02/17 1332 11/02/17 1838  LIPASE 24 22   No results for input(s): AMMONIA in the  last 168 hours. Coagulation Profile: No results for input(s): INR, PROTIME in the last 168 hours. Cardiac Enzymes: No results for input(s): CKTOTAL, CKMB, CKMBINDEX, TROPONINI in the last 168 hours. BNP (last 3 results) No results for input(s): PROBNP in the last 8760 hours. HbA1C: Recent Labs    11/02/17 1831  HGBA1C 8.1*   CBG: Recent Labs  Lab 11/04/17 2048 11/05/17 0055 11/05/17 0435 11/05/17 0808 11/05/17 1157  GLUCAP 214* 236* 248* 220* 185*   Lipid Profile: No results for input(s): CHOL, HDL, LDLCALC, TRIG, CHOLHDL, LDLDIRECT in the last 72 hours. Thyroid Function Tests: No results for input(s): TSH, T4TOTAL, FREET4, T3FREE, THYROIDAB in the last 72 hours. Anemia Panel: No results for input(s): VITAMINB12, FOLATE, FERRITIN, TIBC, IRON, RETICCTPCT in the last 72 hours. Urine analysis:    Component Value Date/Time   COLORURINE YELLOW 11/03/2017 1149   APPEARANCEUR CLOUDY (A) 11/03/2017 1149   LABSPEC 1.020 11/03/2017 1149   PHURINE 6.0 11/03/2017 1149   GLUCOSEU >=500 (A) 11/03/2017 1149   GLUCOSEU >=1000 11/07/2012 1205   HGBUR LARGE (A) 11/03/2017 1149   BILIRUBINUR NEGATIVE 11/03/2017 1149   KETONESUR 80 (A) 11/03/2017 1149   PROTEINUR 100 (A) 11/03/2017 1149   UROBILINOGEN 0.2 11/24/2014 1045   NITRITE NEGATIVE 11/03/2017 1149   LEUKOCYTESUR MODERATE (A) 11/03/2017 1149   Sepsis Labs: @LABRCNTIP (procalcitonin:4,lacticidven:4) )No results found for this or any previous visit (from the past 240 hour(s)).       Radiology Studies: No results found.    Scheduled Meds: . insulin aspart  0-9 Units Subcutaneous TID WC  . metoCLOPramide (REGLAN) injection  10 mg Intravenous Q6H   Continuous Infusions: . famotidine (PEPCID) IV 20 mg (11/05/17 1009)  .  sodium bicarbonate  infusion 1000 mL 125 mL/hr at 11/05/17 1211     LOS: 3 days    Time spent in minutes: 35    Debbe Odea, MD [facility] Pager: [email or web address] Password  [email or web address] 11/05/2017, 2:50 PM

## 2017-11-05 NOTE — Progress Notes (Signed)
Inpatient Diabetes Program Recommendations  AACE/ADA: New Consensus Statement on Inpatient Glycemic Control (2015)  Target Ranges:  Prepandial:   less than 140 mg/dL      Peak postprandial:   less than 180 mg/dL (1-2 hours)      Critically ill patients:  140 - 180 mg/dL   Lab Results  Component Value Date   GLUCAP 220 (H) 11/05/2017   HGBA1C 8.1 (H) 11/02/2017    Review of Glycemic ControlResults for AVIYAH, SWETZ (MRN [id number]) as of 11/05/2017 10:13  Ref. Range 11/04/2017 16:29 11/04/2017 20:48 11/05/2017 00:55 11/05/2017 04:35 11/05/2017 08:08  Glucose-Capillary Latest Ref Range: 70 - 99 mg/dL 233 (H) 214 (H) 236 (H) 248 (H) 220 (H)    Diabetes history: Type 1 DM Outpatient Diabetes medications: Pump Settings as of May 2019 are: Basal Rate: 1.4 units/hour Total Basal insulin per 24 hour period= 33.6 units Insulin to Carb Ratio- 1 unit Novolog for every 10 grams of Carbohydrates Insulin Sensitivity- 1 unit Novolog for every 50 mg/dl > Target CBG of 150 mg/dl Current orders for Inpatient glycemic control:  Novolog sensitive tid with meals Inpatient Diabetes Program Recommendations:   Note plans for patient to transition to insulin pump this evening.  Per RN patient will not be able to start insulin pump til after 6 PM.  Consider adding once time dose of Lantus 8 units this morning. Will follow.   Thanks,  Adah Perl, RN, BC-ADM Inpatient Diabetes Coordinator Pager [phone] (8a-5p)

## 2017-11-06 ENCOUNTER — Inpatient Hospital Stay: Payer: Self-pay

## 2017-11-06 LAB — MAGNESIUM: Magnesium: 1.8 mg/dL (ref 1.7–2.4)

## 2017-11-06 LAB — BASIC METABOLIC PANEL
Anion gap: 8 (ref 5–15)
BUN: 8 mg/dL (ref 6–20)
CALCIUM: 8.5 mg/dL — AB (ref 8.9–10.3)
CO2: 35 mmol/L — ABNORMAL HIGH (ref 22–32)
CREATININE: 0.58 mg/dL (ref 0.44–1.00)
Chloride: 95 mmol/L — ABNORMAL LOW (ref 98–111)
GFR calc non Af Amer: >60 mL/min (ref >60)
Glucose, Bld: 214 mg/dL — ABNORMAL HIGH (ref 70–99)
Potassium: 3.1 mmol/L — ABNORMAL LOW (ref 3.5–5.1)
SODIUM: 138 mmol/L (ref 135–145)

## 2017-11-06 LAB — GLUCOSE, CAPILLARY
GLUCOSE-CAPILLARY: 177 mg/dL — AB (ref 70–99)
GLUCOSE-CAPILLARY: 131 mg/dL — AB (ref 70–99)
GLUCOSE-CAPILLARY: 88 mg/dL (ref 70–99)
Glucose-Capillary: 178 mg/dL — ABNORMAL HIGH (ref 70–99)
Glucose-Capillary: 117 mg/dL — ABNORMAL HIGH (ref 70–99)

## 2017-11-06 MED ORDER — SODIUM CHLORIDE 0.9% FLUSH
10.0000 mL | Freq: Two times a day (BID) | INTRAVENOUS | Status: DC
  Administered 2017-11-06: 10 mL

## 2017-11-06 MED ORDER — SODIUM CHLORIDE 0.9% FLUSH: 10.0000 mL | INTRAVENOUS | Status: DC | PRN

## 2017-11-06 MED ORDER — SCOPOLAMINE 1 MG/3DAYS TD PT72
1.0000 | MEDICATED_PATCH | TRANSDERMAL | Status: DC
  Administered 2017-11-06: 1.5 mg via TRANSDERMAL
  Filled 2017-11-06: qty 1

## 2017-11-06 MED ORDER — POTASSIUM CHLORIDE 10 MEQ/50ML IV SOLN
10.0000 meq | INTRAVENOUS | Status: AC
  Administered 2017-11-06 (×6): 10 meq via INTRAVENOUS
  Filled 2017-11-06 (×6): qty 50

## 2017-11-06 MED ORDER — POTASSIUM CHLORIDE CRYS ER 20 MEQ PO TBCR
40.0000 meq | EXTENDED_RELEASE_TABLET | ORAL | Status: DC
  Administered 2017-11-06: 40 meq via ORAL
  Filled 2017-11-06: qty 2

## 2017-11-06 MED ORDER — ONDANSETRON 4 MG PO TBDP
4.0000 mg | ORAL_TABLET | Freq: Once | ORAL | Status: AC
  Administered 2017-11-06: 4 mg via ORAL
  Filled 2017-11-06: qty 1

## 2017-11-06 NOTE — Progress Notes (Signed)
After lab draw this am, patient's IV area noted to be swollen, tender, and firm.  IV fluids were stopped immediately and upon assessment of the IV, no blood return was noted.  IV team paged to restart another IV since the patient is a difficult stick.  IV team entered room and called this RN into room due to swelling of both hands, left greater than right.  The swelling on the right hand extends up the arm and past the noninfusing IV site.  Alger Memos, NP made aware of these changes.    Patient was also actively vomiting at this time and requesting medication for nausea and pain; without functioning IV, order was placed for SL Zofran.  Ice packs were applied to arm. MD will determine if PICC line needs to be placed.  Will continue to monitor the patient closely.  Carney Corners, RN

## 2017-11-06 NOTE — Progress Notes (Signed)
Peripherally Inserted Central Catheter/Midline Placement  The IV Nurse has discussed with the patient and/or persons authorized to consent for the patient, the purpose of this procedure and the potential benefits and risks involved with this procedure.  The benefits include less needle sticks, lab draws from the catheter, and the patient may be discharged home with the catheter. Risks include, but not limited to, infection, bleeding, blood clot (thrombus formation), and puncture of an artery; nerve damage and irregular heartbeat and possibility to perform a PICC exchange if needed/ordered by physician.  Alternatives to this procedure were also discussed.  Bard Power PICC patient education guide, fact sheet on infection prevention and patient information card has been provided to patient /or left at bedside.    PICC/Midline Placement Documentation  PICC Single Lumen 11/06/17 PICC Right Brachial 33 cm 0 cm (Active)  Indication for Insertion or Continuance of Line Prolonged intravenous therapies;Poor Vasculature-patient has had multiple peripheral attempts or PIVs lasting less than 24 hours 11/06/2017  2:18 PM  Exposed Catheter (cm) 0 cm 11/06/2017  2:18 PM  Site Assessment Clean;Dry;Intact 11/06/2017  2:18 PM  Line Status Flushed;Saline locked;Blood return noted 11/06/2017  2:18 PM  Dressing Type Transparent 11/06/2017  2:18 PM  Dressing Status Clean;Dry;Intact;Antimicrobial disc in place 11/06/2017  2:18 PM  Line Care Connections checked and tightened 11/06/2017  2:18 PM  Line Adjustment (NICU/IV Team Only) No 11/06/2017  2:18 PM  Dressing Intervention New dressing 11/06/2017  2:18 PM  Dressing Change Due 11/13/17 11/06/2017  2:18 PM       Rolena Infante 11/06/2017, 2:19 PM

## 2017-11-06 NOTE — Progress Notes (Signed)
PROGRESS NOTE    [name]   BPZ:[id number]  DOB: 10-Jun-1990  DOA: 11/02/2017 PCP: Vicenta Aly, FNP   Brief Narrative:  [name]  is a 27 y.o. female with medical history significant of diabetes mellitus type 1 essential hypertension, gastroparesis noncompliant with her medication and past medical history of drug abuse marijuana whose had multiple admission for gastroparesis in the setting of marijuana use comes in for nausea and vomiting that started on the day of admission progressively getting worse unable to keep anything down since this morning.  She relates she use cannabis over the weekend, she also relates abdominal pain that started this afternoon.  She denies any fevers, chills, chest pain, shortness of breath, no recent procedures, no change in her medications, no cuts or sores    Subjective: Abdominal pain, nausea and vomiting persist. IV infiltrated and RNs have not been able to place new IV.    Assessment & Plan:   Principal Problem:   Intractable nausea and vomiting- likely gastroparesis - added IV Phernergan, increased IV Reglan to QID - added IV Dilaudid -  Advanced to soft diet but not able to tolerate it yet - will need PICC today as unable to get IV - will add scopolamine patch  Active Problems:   Uncontrolled type 1 diabetes mellitus  -  resumed her insulin pump today & stopped Lantus on 10/18- sugars stable  Metabolic acidosis/ dehydration - normal anion gap- likely has lactic and starvation acidosis - started Bicarb drip with D5 which has been stopped as she has no IV access - Bicarb is elevated today and therefore, will not resume infusion   Hypokalemia - replace via once PICC placed- she is vomiting and unable to take oral replacement today    Sinus tachycardia - due to ongoing pain/ distress    Protein-calorie malnutrition, severe     DVT prophylaxis: SCDs Code Status: Full code Family Communication:  Disposition Plan:  home when able to tolerate orals Consultants:   none Procedures:   none Antimicrobials:  Anti-infectives (From admission, onward)   None       Objective: Vitals:   11/05/17 0529 11/05/17 1549 11/05/17 2141 11/06/17 0611  BP: (!) 143/96 (!) 137/99 (!) 144/101 (!) 145/111  Pulse: (!) 106 (!) 111 (!) 112 (!) 109  Resp: 16 20 16 20   Temp: 99.7 F (37.6 C) 99.5 F (37.5 C) 99 F (37.2 C) 98.9 F (37.2 C)  TempSrc: Oral Oral Oral Oral  SpO2: 100% 99% 100% 100%  Weight:      Height:        Intake/Output Summary (Last 24 hours) at 11/06/2017 1111 Last data filed at 11/06/2017 0600 Gross per 24 hour  Intake 1822.6 ml  Output -  Net 1822.6 ml   Filed Weights   11/02/17 1309  Weight: 59 kg    Examination: General exam: Appears comfortable  HEENT: PERRLA, oral mucosa moist, no sclera icterus or thrush Respiratory system: Clear to auscultation. Respiratory effort normal. Cardiovascular system: S1 & S2 heard,  No murmurs  Gastrointestinal system: Abdomen soft, diffusely tender, nondistended. Normal bowel sound. No organomegaly Central nervous system: Alert and oriented. No focal neurological deficits. Extremities: No cyanosis, clubbing or edema Skin: No rashes or ulcers Psychiatry:  depressed    Data Reviewed: I have personally reviewed following labs and imaging studies  CBC: Recent Labs  Lab 11/02/17 1332 11/03/17 0704 11/04/17 0549  WBC 9.7 13.5* 12.6*  HGB 13.9 11.0* 11.5*  HCT 43.2 35.4*  35.4*  MCV 87.3 93.7 88.7  PLT 419* 325 035   Basic Metabolic Panel: Recent Labs  Lab 11/02/17 1332 11/02/17 1838 11/03/17 0704 11/04/17 0549 11/05/17 0544 11/06/17 0548  NA 147*  --  139 141 138 138  K 3.5  --  4.0 3.6 3.2* 3.1*  CL 108  --  116* 110 100 95*  CO2 27  --  11* 22 28 35*  GLUCOSE 82  --  205* 245* 257* 214*  BUN 20  --  18 13 9 8   CREATININE 0.68  --  0.61 0.69 0.60 0.58  CALCIUM 10.4*  --  8.0* 8.9 8.4* 8.5*  MG  --  1.7  --   --   --  1.8     GFR: Estimated Creatinine Clearance: 86.6 mL/min (by C-G formula based on SCr of 0.58 mg/dL). Liver Function Tests: Recent Labs  Lab 11/02/17 1332 11/03/17 0704  AST 19 17  ALT 13 13  ALKPHOS 85 68  BILITOT 0.8 0.9  PROT 8.5* 6.6  ALBUMIN 4.7 3.6   Recent Labs  Lab 11/02/17 1332 11/02/17 1838  LIPASE 24 22   No results for input(s): AMMONIA in the last 168 hours. Coagulation Profile: No results for input(s): INR, PROTIME in the last 168 hours. Cardiac Enzymes: No results for input(s): CKTOTAL, CKMB, CKMBINDEX, TROPONINI in the last 168 hours. BNP (last 3 results) No results for input(s): PROBNP in the last 8760 hours. HbA1C: No results for input(s): HGBA1C in the last 72 hours. CBG: Recent Labs  Lab 11/05/17 0808 11/05/17 1157 11/05/17 2147 11/06/17 0249 11/06/17 0733  GLUCAP 220* 185* 227* 177* 178*   Lipid Profile: No results for input(s): CHOL, HDL, LDLCALC, TRIG, CHOLHDL, LDLDIRECT in the last 72 hours. Thyroid Function Tests: No results for input(s): TSH, T4TOTAL, FREET4, T3FREE, THYROIDAB in the last 72 hours. Anemia Panel: No results for input(s): VITAMINB12, FOLATE, FERRITIN, TIBC, IRON, RETICCTPCT in the last 72 hours. Urine analysis:    Component Value Date/Time   COLORURINE YELLOW 11/03/2017 1149   APPEARANCEUR CLOUDY (A) 11/03/2017 1149   LABSPEC 1.020 11/03/2017 1149   PHURINE 6.0 11/03/2017 1149   GLUCOSEU >=500 (A) 11/03/2017 1149   GLUCOSEU >=1000 11/07/2012 1205   HGBUR LARGE (A) 11/03/2017 1149   BILIRUBINUR NEGATIVE 11/03/2017 1149   KETONESUR 80 (A) 11/03/2017 1149   PROTEINUR 100 (A) 11/03/2017 1149   UROBILINOGEN 0.2 11/24/2014 1045   NITRITE NEGATIVE 11/03/2017 1149   LEUKOCYTESUR MODERATE (A) 11/03/2017 1149   Sepsis Labs: @LABRCNTIP (procalcitonin:4,lacticidven:4) )No results found for this or any previous visit (from the past 240 hour(s)).       Radiology Studies: [place] Ekg Site Rite  Result Date: 11/06/2017 If [facility] image not attached, placement could not be confirmed due to current cardiac rhythm.     Scheduled Meds: . insulin aspart  0-9 Units Subcutaneous TID WC  . insulin pump   Subcutaneous TID AC, HS, 0200  . metoCLOPramide (REGLAN) injection  10 mg Intravenous Q6H  . potassium chloride  40 mEq Oral Q4H   Continuous Infusions: . famotidine (PEPCID) IV Stopped (11/05/17 2229)  .  sodium bicarbonate  infusion 1000 mL Stopped (11/06/17 0556)     LOS: 4 days    Time spent in minutes: 35    Debbe Odea, MD [facility] Pager: [email or web address] Password [email or web address] 11/06/2017, 11:11 AM

## 2017-11-06 NOTE — Progress Notes (Signed)
Late entry: call received from RN regarding patient resuming insulin pump.  Instructed her on use of insulin pump order set and that contract/flowsheet embedded in order set.  No further questions at this time.   Thanks,   Adah Perl, RN, BC-ADM Inpatient Diabetes Coordinator Pager [phone]

## 2017-11-07 LAB — GLUCOSE, CAPILLARY
GLUCOSE-CAPILLARY: 314 mg/dL — AB (ref 70–99)
Glucose-Capillary: 108 mg/dL — ABNORMAL HIGH (ref 70–99)
Glucose-Capillary: 111 mg/dL — ABNORMAL HIGH (ref 70–99)

## 2017-11-07 MED ORDER — CEPHALEXIN 500 MG PO CAPS: 500.0000 mg | ORAL_CAPSULE | Freq: Two times a day (BID) | ORAL | 0 refills | Status: DC

## 2017-11-07 MED ORDER — METOCLOPRAMIDE HCL 10 MG PO TABS: 10.0000 mg | ORAL_TABLET | Freq: Three times a day (TID) | ORAL | 1 refills | Status: DC

## 2017-11-07 MED ORDER — SODIUM CHLORIDE 0.9 % IV SOLN
INTRAVENOUS | Status: DC | PRN
  Administered 2017-11-07: 01:00:00 via INTRAVENOUS

## 2017-11-07 MED ORDER — INSULIN PUMP: 1.0000 | Freq: Three times a day (TID) | SUBCUTANEOUS | Status: DC

## 2017-11-07 MED ORDER — ONDANSETRON 4 MG PO TBDP: 4.0000 mg | ORAL_TABLET | Freq: Three times a day (TID) | ORAL | 0 refills | Status: DC | PRN

## 2017-11-07 MED ORDER — SCOPOLAMINE 1 MG/3DAYS TD PT72: 1.0000 | MEDICATED_PATCH | TRANSDERMAL | 12 refills | Status: DC | PRN

## 2017-11-07 MED ORDER — CEPHALEXIN 500 MG PO CAPS: 500.0000 mg | ORAL_CAPSULE | Freq: Two times a day (BID) | ORAL | Status: DC

## 2017-11-07 MED ORDER — PROMETHAZINE HCL 50 MG PO TABS: 25.0000 mg | ORAL_TABLET | Freq: Four times a day (QID) | ORAL | 0 refills | Status: DC | PRN

## 2017-11-07 MED ORDER — POLYETHYLENE GLYCOL 3350 17 G PO PACK: 17.0000 g | PACK | Freq: Every day | ORAL | 0 refills | Status: DC | PRN

## 2017-11-07 MED ORDER — TRAMADOL HCL 50 MG PO TABS: 50.0000 mg | ORAL_TABLET | Freq: Four times a day (QID) | ORAL | 0 refills | Status: DC | PRN

## 2017-11-07 NOTE — Discharge Summary (Addendum)
Physician Discharge Summary  [name] GYF:[id number] DOB: 11/23/1990 DOA: 11/02/2017  PCP: Vicenta Aly, FNP  Admit date: 11/02/2017 Discharge date: 11/07/2017  Admitted From: home Disposition:  home      Discharge Condition:  stable   CODE STATUS:  Full code   Consultations:  none    Discharge Diagnoses:  Principal Problem:   Intractable nausea and vomiting Active Problems:   Uncontrolled type 1 diabetes mellitus (HCC)   Sinus tachycardia   Diabetic gastroparesis (HCC)   Protein-calorie malnutrition, severe   Hypernatremia       Hospital Course:  [name] is a 27 y.o.femalewith medical history significant ofdiabetes mellitus type 1 essential hypertension, gastroparesis noncompliant with her medication and past medical history of drug abuse marijuana whose had multiple admission for gastroparesis in the setting of marijuana use comes in for nausea and vomiting that started on the day of admission progressively getting worse unable to keep anything down since this morning. She relates she use cannabis over the weekend, she also relates abdominal pain that started this afternoon. She denies any fevers, chills, chest pain, shortness of breath, no recent procedures, no change in her medications, no cuts or sores  Principal Problem:   Intractable nausea and vomiting- likely gastroparesis - added IV Phernergan, increased IV Reglan to QID and added IV Dilaudid -  Advanced to soft diet but not able to tolerate it yet- added Scopolamine patch as she lost her IV temporarily and needed a PICC - will need PICC today as unable to get IV - tolerating a solid diet now and stable for d/c home  Active Problems:   Uncontrolled type 1 diabetes mellitus  -  resumed her insulin pump  & stopped Lantus on 10/18- sugars stable  Metabolic acidosis/ dehydration/ hypernatremia - normal anion gap- likely had lactic and starvation acidosis - started Bicarb drip with  D5 which helped resolve this   Hypokalemia - replace via once PICC placed- she is vomiting and unable to take oral replacement today    Sinus tachycardia - due to ongoing pain/ distress- resolved    Protein-calorie malnutrition, severe - placed on supplements while in the hospital- eating much better now  Cannabis use - counseled to try to avoid this  UTI - on Keflex for total 7 days- culture unfortunately not sent in ED    Discharge Exam: Vitals:   11/06/17 2126 11/07/17 0513  BP: (!) 138/104 94/65  Pulse: (!) 114 99  Resp: 18 16  Temp: 99.4 F (37.4 C) 98.8 F (37.1 C)  SpO2: 100% 96%   Vitals:   11/06/17 1211 11/06/17 1503 11/06/17 2126 11/07/17 0513  BP:  (!) 129/96 (!) 138/104 94/65  Pulse:  (!) 108 (!) 114 99  Resp:  14 18 16   Temp: 98.6 F (37 C) 99.2 F (37.3 C) 99.4 F (37.4 C) 98.8 F (37.1 C)  TempSrc: Oral Oral Oral Oral  SpO2:  99% 100% 96%  Weight:      Height:        General: Pt is alert, awake, not in acute distress Cardiovascular: RRR, S1/S2 +, no rubs, no gallops Respiratory: CTA bilaterally, no wheezing, no rhonchi Abdominal: Soft, NT, ND, bowel sounds + Extremities: no edema, no cyanosis   Discharge Instructions  Discharge Instructions    Diet Carb Modified   Complete by:  As directed    Increase activity slowly   Complete by:  As directed      Allergies as of 11/07/2017  Reactions   Peanut-containing Drug Products Swelling, Other (See Comments)   Reaction:  Swelling of mouth and lips    Food Swelling, Other (See Comments)   Pt is allergic to strawberries.   Reaction:  Swelling of mouth and lips    Ultram [tramadol] Itching      Medication List    STOP taking these medications   insulin glargine 100 UNIT/ML injection Commonly known as:  LANTUS   NOVOLOG FLEXPEN 100 UNIT/ML FlexPen Generic drug:  insulin aspart   TRESIBA FLEXTOUCH 100 UNIT/ML Sopn FlexTouch Pen Generic drug:  insulin degludec     TAKE these  medications   albuterol 108 (90 Base) MCG/ACT inhaler Commonly known as:  PROVENTIL HFA;VENTOLIN HFA Inhale 1-2 puffs into the lungs every 6 (six) hours as needed for wheezing or shortness of breath.   cephALEXin 500 MG capsule Commonly known as:  KEFLEX Take 1 capsule (500 mg total) by mouth every 12 (twelve) hours.   glucagon 1 MG injection Inject 1 mg into the muscle once as needed (For very low blood sugars. May repeat in 15-20 minutes if needed.).   insulin pump Soln Inject 1 each into the skin 3 times daily with meals, bedtime and 2 AM.   metoCLOPramide 10 MG tablet Commonly known as:  REGLAN Take 1 tablet (10 mg total) by mouth 3 (three) times daily with meals.   ondansetron 4 MG disintegrating tablet Commonly known as:  ZOFRAN-ODT Take 1 tablet (4 mg total) by mouth every 8 (eight) hours as needed for nausea or vomiting.   polyethylene glycol packet Commonly known as:  MIRALAX / GLYCOLAX Take 17 g by mouth daily as needed for mild constipation.   promethazine 50 MG tablet Commonly known as:  PHENERGAN Take 0.5 tablets (25 mg total) by mouth every 6 (six) hours as needed for nausea or vomiting.   scopolamine 1 MG/3DAYS Commonly known as:  TRANSDERM-SCOP Place 1 patch (1.5 mg total) onto the skin every 3 (three) days as needed (during episodes of vomiting).   traMADol 50 MG tablet Commonly known as:  ULTRAM Take 1-2 tablets (50-100 mg total) by mouth every 6 (six) hours as needed.   ZESTRIL 2.5 MG tablet Generic drug:  lisinopril Take 2.5 mg by mouth daily.       Allergies  Allergen Reactions  . Peanut-Containing Drug Products Swelling and Other (See Comments)    Reaction:  Swelling of mouth and lips   . 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   Reaction:  Swelling of mouth and lips   . Ultram [Tramadol] Itching     Procedures/Studies:  PICC line  [place] Ekg Site Rite  Result Date: 11/06/2017 If Site Rite image not  attached, placement could not be confirmed due to current cardiac rhythm.    The results of significant diagnostics from this hospitalization (including imaging, microbiology, ancillary and laboratory) are listed below for reference.     Microbiology: No results found for this or any previous visit (from the past 240 hour(s)).   Labs: BNP (last 3 results) No results for input(s): BNP in the last 8760 hours. Basic Metabolic Panel: Recent Labs  Lab 11/02/17 1332 11/02/17 1838 11/03/17 0704 11/04/17 0549 11/05/17 0544 11/06/17 0548  NA 147*  --  139 141 138 138  K 3.5  --  4.0 3.6 3.2* 3.1*  CL 108  --  116* 110 100 95*  CO2 27  --  11* 22  28 35*  GLUCOSE 82  --  205* 245* 257* 214*  BUN 20  --  18 13 9 8   CREATININE 0.68  --  0.61 0.69 0.60 0.58  CALCIUM 10.4*  --  8.0* 8.9 8.4* 8.5*  MG  --  1.7  --   --   --  1.8   Liver Function Tests: Recent Labs  Lab 11/02/17 1332 11/03/17 0704  AST 19 17  ALT 13 13  ALKPHOS 85 68  BILITOT 0.8 0.9  PROT 8.5* 6.6  ALBUMIN 4.7 3.6   Recent Labs  Lab 11/02/17 1332 11/02/17 1838  LIPASE 24 22   No results for input(s): AMMONIA in the last 168 hours. CBC: Recent Labs  Lab 11/02/17 1332 11/03/17 0704 11/04/17 0549  WBC 9.7 13.5* 12.6*  HGB 13.9 11.0* 11.5*  HCT 43.2 35.4* 35.4*  MCV 87.3 93.7 88.7  PLT 419* 325 310   Cardiac Enzymes: No results for input(s): CKTOTAL, CKMB, CKMBINDEX, TROPONINI in the last 168 hours. BNP: Invalid input(s): POCBNP CBG: Recent Labs  Lab 11/06/17 1704 11/06/17 2128 11/07/17 0010 11/07/17 0300 11/07/17 0734  GLUCAP 88 117* 111* 314* 108*   D-Dimer No results for input(s): DDIMER in the last 72 hours. Hgb A1c No results for input(s): HGBA1C in the last 72 hours. Lipid Profile No results for input(s): CHOL, HDL, LDLCALC, TRIG, CHOLHDL, LDLDIRECT in the last 72 hours. Thyroid function studies No results for input(s): TSH, T4TOTAL, T3FREE, THYROIDAB in the last 72  hours.  Invalid input(s): FREET3 Anemia work up No results for input(s): VITAMINB12, FOLATE, FERRITIN, TIBC, IRON, RETICCTPCT in the last 72 hours. Urinalysis    Component Value Date/Time   COLORURINE YELLOW 11/03/2017 1149   APPEARANCEUR CLOUDY (A) 11/03/2017 1149   LABSPEC 1.020 11/03/2017 1149   PHURINE 6.0 11/03/2017 1149   GLUCOSEU >=500 (A) 11/03/2017 1149   GLUCOSEU >=1000 11/07/2012 1205   HGBUR LARGE (A) 11/03/2017 1149   BILIRUBINUR NEGATIVE 11/03/2017 1149   KETONESUR 80 (A) 11/03/2017 1149   PROTEINUR 100 (A) 11/03/2017 1149   UROBILINOGEN 0.2 11/24/2014 1045   NITRITE NEGATIVE 11/03/2017 1149   LEUKOCYTESUR MODERATE (A) 11/03/2017 1149   Sepsis Labs Invalid input(s): PROCALCITONIN,  WBC,  LACTICIDVEN Microbiology No results found for this or any previous visit (from the past 240 hour(s)).   Time coordinating discharge in minutes: 55  SIGNED:   Debbe Odea, MD  [facility] 11/07/2017, 9:30 AM Pager   If 7PM-7AM, please contact night-coverage [email or web address] Password [email or web address]

## 2017-11-14 ENCOUNTER — Inpatient Hospital Stay (HOSPITAL_COMMUNITY)
Admission: EM | Admit: 2017-11-14 | Discharge: 2017-11-21 | DRG: 073 | Disposition: A | Discharge status: Home / Self Care | Payer: Medicaid Other | Attending: Internal Medicine | Admitting: Internal Medicine

## 2017-11-14 ENCOUNTER — Other Ambulatory Visit: Payer: Self-pay

## 2017-11-14 ENCOUNTER — Encounter (HOSPITAL_COMMUNITY): Payer: Self-pay | Admitting: *Deleted

## 2017-11-14 DIAGNOSIS — Z79899 Other long term (current) drug therapy: Secondary | ICD-10-CM

## 2017-11-14 DIAGNOSIS — K5900 Constipation, unspecified: Secondary | ICD-10-CM | POA: Diagnosis present

## 2017-11-14 DIAGNOSIS — K3184 Gastroparesis: Secondary | ICD-10-CM | POA: Diagnosis present

## 2017-11-14 DIAGNOSIS — E860 Dehydration: Secondary | ICD-10-CM | POA: Diagnosis present

## 2017-11-14 DIAGNOSIS — J45909 Unspecified asthma, uncomplicated: Secondary | ICD-10-CM | POA: Diagnosis present

## 2017-11-14 DIAGNOSIS — E10649 Type 1 diabetes mellitus with hypoglycemia without coma: Secondary | ICD-10-CM | POA: Diagnosis present

## 2017-11-14 DIAGNOSIS — Z6823 Body mass index (BMI) 23.0-23.9, adult: Secondary | ICD-10-CM

## 2017-11-14 DIAGNOSIS — F1290 Cannabis use, unspecified, uncomplicated: Secondary | ICD-10-CM | POA: Diagnosis present

## 2017-11-14 DIAGNOSIS — Z794 Long term (current) use of insulin: Secondary | ICD-10-CM

## 2017-11-14 DIAGNOSIS — Z9641 Presence of insulin pump (external) (internal): Secondary | ICD-10-CM | POA: Diagnosis present

## 2017-11-14 DIAGNOSIS — IMO0002 Reserved for concepts with insufficient information to code with codable children: Secondary | ICD-10-CM | POA: Diagnosis present

## 2017-11-14 DIAGNOSIS — D72829 Elevated white blood cell count, unspecified: Secondary | ICD-10-CM | POA: Diagnosis present

## 2017-11-14 DIAGNOSIS — Z8349 Family history of other endocrine, nutritional and metabolic diseases: Secondary | ICD-10-CM

## 2017-11-14 DIAGNOSIS — Z833 Family history of diabetes mellitus: Secondary | ICD-10-CM

## 2017-11-14 DIAGNOSIS — E1043 Type 1 diabetes mellitus with diabetic autonomic (poly)neuropathy: Principal | ICD-10-CM | POA: Diagnosis present

## 2017-11-14 DIAGNOSIS — E43 Unspecified severe protein-calorie malnutrition: Secondary | ICD-10-CM | POA: Diagnosis present

## 2017-11-14 DIAGNOSIS — R339 Retention of urine, unspecified: Secondary | ICD-10-CM | POA: Diagnosis present

## 2017-11-14 DIAGNOSIS — Z91018 Allergy to other foods: Secondary | ICD-10-CM

## 2017-11-14 DIAGNOSIS — R000 Tachycardia, unspecified: Secondary | ICD-10-CM | POA: Diagnosis present

## 2017-11-14 DIAGNOSIS — K760 Fatty (change of) liver, not elsewhere classified: Secondary | ICD-10-CM | POA: Diagnosis present

## 2017-11-14 DIAGNOSIS — Z9049 Acquired absence of other specified parts of digestive tract: Secondary | ICD-10-CM

## 2017-11-14 DIAGNOSIS — K219 Gastro-esophageal reflux disease without esophagitis: Secondary | ICD-10-CM | POA: Diagnosis present

## 2017-11-14 DIAGNOSIS — Z8249 Family history of ischemic heart disease and other diseases of the circulatory system: Secondary | ICD-10-CM

## 2017-11-14 DIAGNOSIS — E1143 Type 2 diabetes mellitus with diabetic autonomic (poly)neuropathy: Secondary | ICD-10-CM

## 2017-11-14 DIAGNOSIS — Z885 Allergy status to narcotic agent status: Secondary | ICD-10-CM

## 2017-11-14 DIAGNOSIS — Z91010 Allergy to peanuts: Secondary | ICD-10-CM

## 2017-11-14 DIAGNOSIS — R1110 Vomiting, unspecified: Secondary | ICD-10-CM

## 2017-11-14 DIAGNOSIS — E1065 Type 1 diabetes mellitus with hyperglycemia: Secondary | ICD-10-CM | POA: Diagnosis present

## 2017-11-14 DIAGNOSIS — R112 Nausea with vomiting, unspecified: Secondary | ICD-10-CM

## 2017-11-14 LAB — CBC
HCT: 40.8 % (ref 36.0–46.0)
Hemoglobin: 12.9 g/dL (ref 12.0–15.0)
MCH: 28.2 pg (ref 26.0–34.0)
MCHC: 31.6 g/dL (ref 30.0–36.0)
MCV: 89.3 fL (ref 80.0–100.0)
Platelets: 536 10*3/uL — ABNORMAL HIGH (ref 150–400)
RBC: 4.57 MIL/uL (ref 3.87–5.11)
RDW: 13.6 % (ref 11.5–15.5)
WBC: 14.1 10*3/uL — ABNORMAL HIGH (ref 4.0–10.5)
nRBC: 0 % (ref 0.0–0.2)

## 2017-11-14 LAB — COMPREHENSIVE METABOLIC PANEL
ALT: 16 U/L (ref 0–44)
AST: 21 U/L (ref 15–41)
Albumin: 4.8 g/dL (ref 3.5–5.0)
Alkaline Phosphatase: 79 U/L (ref 38–126)
Anion gap: 16 — ABNORMAL HIGH (ref 5–15)
BUN: 18 mg/dL (ref 6–20)
CO2: 22 mmol/L (ref 22–32)
Calcium: 9.9 mg/dL (ref 8.9–10.3)
Chloride: 99 mmol/L (ref 98–111)
Creatinine, Ser: 0.84 mg/dL (ref 0.44–1.00)
GFR calc Af Amer: >60 mL/min (ref >60)
GFR calc non Af Amer: >60 mL/min (ref >60)
Glucose, Bld: 408 mg/dL — ABNORMAL HIGH (ref 70–99)
Potassium: 4.2 mmol/L (ref 3.5–5.1)
Sodium: 137 mmol/L (ref 135–145)
Total Bilirubin: 1 mg/dL (ref 0.3–1.2)
Total Protein: 8.3 g/dL — ABNORMAL HIGH (ref 6.5–8.1)

## 2017-11-14 LAB — LIPASE, BLOOD: Lipase: 17 U/L (ref 11–51)

## 2017-11-14 LAB — CBG MONITORING, ED: Glucose-Capillary: 251 mg/dL — ABNORMAL HIGH (ref 70–99)

## 2017-11-14 LAB — I-STAT BETA HCG BLOOD, ED (MC, WL, AP ONLY): I-stat hCG, quantitative: <5 m[IU]/mL (ref <5)

## 2017-11-14 LAB — I-STAT CG4 LACTIC ACID, ED: LACTIC ACID, VENOUS: 1.29 mmol/L (ref 0.5–1.9)

## 2017-11-14 MED ORDER — INSULIN ASPART 100 UNIT/ML ~~LOC~~ SOLN
10.0000 [IU] | Freq: Once | SUBCUTANEOUS | Status: AC
  Administered 2017-11-14: 10 [IU] via INTRAVENOUS
  Filled 2017-11-14: qty 1

## 2017-11-14 MED ORDER — MORPHINE SULFATE (PF) 4 MG/ML IV SOLN
4.0000 mg | Freq: Once | INTRAVENOUS | Status: DC
  Filled 2017-11-14: qty 1

## 2017-11-14 MED ORDER — SODIUM CHLORIDE 0.9 % IV BOLUS
1000.0000 mL | Freq: Once | INTRAVENOUS | Status: AC
  Administered 2017-11-14: 1000 mL via INTRAVENOUS

## 2017-11-14 MED ORDER — HALOPERIDOL LACTATE 5 MG/ML IJ SOLN
2.0000 mg | Freq: Once | INTRAMUSCULAR | Status: AC
  Administered 2017-11-14: 2 mg via INTRAMUSCULAR

## 2017-11-14 MED ORDER — MORPHINE SULFATE (PF) 4 MG/ML IV SOLN
4.0000 mg | Freq: Once | INTRAVENOUS | Status: AC
  Administered 2017-11-14: 4 mg via INTRAMUSCULAR

## 2017-11-14 MED ORDER — ONDANSETRON HCL 4 MG/2ML IJ SOLN
4.0000 mg | Freq: Once | INTRAMUSCULAR | Status: DC
  Filled 2017-11-14: qty 2

## 2017-11-14 MED ORDER — ONDANSETRON 4 MG PO TBDP
4.0000 mg | ORAL_TABLET | Freq: Once | ORAL | Status: AC
  Administered 2017-11-15: 4 mg via ORAL
  Filled 2017-11-14: qty 1

## 2017-11-14 MED ORDER — HALOPERIDOL LACTATE 5 MG/ML IJ SOLN
2.0000 mg | Freq: Once | INTRAMUSCULAR | Status: DC
  Filled 2017-11-14: qty 1

## 2017-11-14 MED ORDER — KETOROLAC TROMETHAMINE 15 MG/ML IJ SOLN
15.0000 mg | Freq: Once | INTRAMUSCULAR | Status: AC
  Administered 2017-11-14: 15 mg via INTRAVENOUS
  Filled 2017-11-14: qty 1

## 2017-11-14 NOTE — ED Notes (Signed)
Pt's family member screaming in lobby that Pt is having a seizure. Pt was seen on floor writhing in pain. Pt does not appear to be having an actual seizure as Pt is alert and responsive during and after. Pt was assisted into wheelchair and taken to triage room to wait for an actual room.

## 2017-11-14 NOTE — ED Notes (Signed)
Unable to obtain labs, pt difficult stick and typically gets ultrasound IV

## 2017-11-14 NOTE — ED Notes (Signed)
Dr. Wilson Singer at bedside attempting IV insertion.

## 2017-11-14 NOTE — ED Provider Notes (Signed)
Medical screening examination/treatment/procedure(s) were conducted as a shared visit with non-physician practitioner(s) and myself.  I personally evaluated the patient during the encounter.  None   27yF with n/v. Hx of gastroparesis. Seen with some frequency for the same. On exam appears uncomfortable. Writhing around on bed. Tachycardic. Diffuse abdominal tenderness w/o rebound or guarding. Not distended. Hyperglycemic without acidosis. IV access obtained. IVF. Will treat symptoms and reassess.   Angiocath insertion Performed by: Virgel Manifold  Consent: Verbal consent obtained. Risks and benefits: risks, benefits and alternatives were discussed Time out: Immediately prior to procedure a "time out" was called to verify the correct patient, procedure, equipment, support staff and site/side marked as required.  Preparation: Patient was prepped and draped in the usual sterile fashion.  Vein Location: R basilic  Ultrasound Guided  Gauge: 20g  Normal blood return and flush without difficulty Patient tolerance: Patient tolerated the procedure well with no immediate complications.     Virgel Manifold, MD 11/14/17 2235

## 2017-11-14 NOTE — ED Provider Notes (Signed)
[name] DEPT Provider Note   CSN: [id number] Arrival date & time: 11/14/17  1804     History   Chief Complaint Chief Complaint  Patient presents with  . Emesis    HPI [name] is a 27 y.o. female.  HPI   [name] is a 27yo female with a history of T1DM with insulin pump, gastroparesis with persistent nausea/vomiting, marijuana use, pancreatitis who presents to the emergency department for evaluation of intractable vomiting and generalized abdominal pain. Patient state that her symptoms started early this morning. States she has had constant vomiting all day, endorses blood tinged emesis. No coffee ground emesis. Has constant 10/10 severity generalized abdominal pain, worsened with vomiting. Tried taking po phenergan which she was unable to keep down. States that her symptoms are similar to prior flare ups of her gastroparesis. Denies prior abdominal surgeries. Denies fever, chills, dysuria, urinary frequency, diarrhea, hematochezia, cp, shortness of breath. No contacts with similar symptoms. Her insulin site fell off as she was vomiting today and it is not currently on her.     Past Medical History:  Diagnosis Date  . Anxiety   . Arthritis    "hands, feet, knees" (12/18/2016)  . Asthma   . Diabetic gastroparesis ([place])    Per gastric emptying study 07/09/16 which showed significant delayed gastric emptying.  . Gallstones   . Gastroparesis   . GERD (gastroesophageal reflux disease)   . Heart murmur   . Hepatic steatosis 11/26/2014   and hepatomegaly  . Hypertension    hx (12/18/2016)  . Intractable cyclical vomiting syndrome    Archie Endo 12/18/2016  . Liver mass 11/26/2014  . Pancreatitis, acute 11/26/2014  . Pneumonia    "as a teen X 1" (12/18/2016)  . Type I diabetes mellitus ([place]) 2007   IDDM.  poorly controlled, multiple admits with DKA    Patient Active Problem List   Diagnosis Date Noted  . Hypernatremia 11/02/2017    . Nausea and vomiting 01/22/2017  . Nausea & vomiting 01/21/2017  . Intractable cyclical vomiting syndrome 12/18/2016  . Leukocytosis 10/24/2016  . N&V (nausea and vomiting) 10/23/2016  . DKA, type 1 ([place]) 09/26/2016  . Thrombocytosis ([place]) 07/08/2016  . Candiduria, asymptomatic 06/23/2016  . Hyponatremia 06/13/2016  . Hematuria 05/13/2016  . UTI (urinary tract infection) 01/22/2016  . Diarrhea 11/09/2015  . Acute urinary retention   . GERD (gastroesophageal reflux disease) 07/10/2015  . Depression with anxiety 07/10/2015  . Gastroparesis 06/22/2015  . Altered mental status 06/22/2015  . Volume depletion 06/10/2015  . Protein-calorie malnutrition, severe 06/10/2015  . Hyperglycemia   . Elevated bilirubin   . Hematemesis with nausea   . Intractable nausea and vomiting 05/28/2015  . Abdominal pain in female   . Abdominal pain 05/24/2015  . Hypertension 05/24/2015  . Dehydration   . Diabetic gastroparesis ([place])   . Chronic diastolic heart failure ([place]) 04/11/2015  . Hematemesis 04/08/2015  . DKA (diabetic ketoacidoses) ([place]) 03/22/2015  . S/P laparoscopic cholecystectomy 02/11/2015  . Postextubation stridor   . Pancreatitis, acute 11/26/2014  . Volume overload 11/26/2014  . Hypokalemia 11/26/2014  . Hepatic steatosis 11/26/2014  . Liver mass 11/26/2014  . Sepsis ([place]) 11/25/2014  . Sinus tachycardia 11/25/2014  . Hypomagnesemia 11/25/2014  . Hypophosphatemia 11/25/2014  . Elevated LFTs 11/24/2014  . AKI (acute kidney injury) ([place]) 11/24/2014  . Migraine headache 11/24/2014  . Asthma 06/29/2012  . Uncontrolled type 1 diabetes mellitus ([place]) 06/19/2010  . Goiter, unspecified 06/19/2010  Past Surgical History:  Procedure Laterality Date  . CHOLECYSTECTOMY N/A 02/11/2015   Procedure: LAPAROSCOPIC CHOLECYSTECTOMY WITH INTRAOPERATIVE CHOLANGIOGRAM;  Surgeon: Greer Pickerel, MD;  Location: [facility];  Service: General;  Laterality: N/A;  . ESOPHAGOGASTRODUODENOSCOPY (EGD)  WITH PROPOFOL Left 09/20/2014   Procedure: ESOPHAGOGASTRODUODENOSCOPY (EGD) WITH PROPOFOL;  Surgeon: Arta Silence, MD;  Location: [facility] ENDOSCOPY;  Service: Endoscopy;  Laterality: Left;  . WISDOM TOOTH EXTRACTION       OB History    Gravida  2   Para  1   Term  0   Preterm  1   AB  1   Living  1     SAB  0   TAB  1   Ectopic  0   Multiple  0   Live Births  1            Home Medications    Prior to Admission medications   Medication Sig Start Date End Date Taking? Authorizing Provider  albuterol (PROVENTIL HFA;VENTOLIN HFA) 108 (90 Base) MCG/ACT inhaler Inhale 1-2 puffs into the lungs every 6 (six) hours as needed for wheezing or shortness of breath.   Yes [provider]  cephALEXin (KEFLEX) 500 MG capsule Take 1 capsule (500 mg total) by mouth every 12 (twelve) hours. 11/07/17  Yes Debbe Odea, MD  glucagon (GLUCAGON EMERGENCY) 1 MG injection Inject 1 mg into the muscle once as needed (For very low blood sugars. May repeat in 15-20 minutes if needed.). 12/20/16  Yes Hongalgi, Lenis Dickinson, MD  Insulin Human (INSULIN PUMP) SOLN Inject 1 each into the skin 3 times daily with meals, bedtime and 2 AM. 11/07/17  Yes Rizwan, Eunice Blase, MD  metoCLOPramide (REGLAN) 10 MG tablet Take 1 tablet (10 mg total) by mouth 3 (three) times daily with meals. 11/07/17 11/07/18 Yes Debbe Odea, MD  ondansetron (ZOFRAN ODT) 4 MG disintegrating tablet Take 1 tablet (4 mg total) by mouth every 8 (eight) hours as needed for nausea or vomiting. 11/07/17  Yes Debbe Odea, MD  polyethylene glycol (MIRALAX / GLYCOLAX) packet Take 17 g by mouth daily as needed for mild constipation. 11/07/17  Yes Debbe Odea, MD  promethazine (PHENERGAN) 50 MG tablet Take 0.5 tablets (25 mg total) by mouth every 6 (six) hours as needed for nausea or vomiting. 11/07/17  Yes Debbe Odea, MD  traMADol (ULTRAM) 50 MG tablet Take 1-2 tablets (50-100 mg total) by mouth every 6 (six) hours as needed. Patient  taking differently: Take 50-100 mg by mouth every 6 (six) hours as needed for moderate pain or severe pain.  11/07/17  Yes Debbe Odea, MD  scopolamine (TRANSDERM-SCOP) 1 MG/3DAYS Place 1 patch (1.5 mg total) onto the skin every 3 (three) days as needed (during episodes of vomiting). Patient not taking: Reported on 11/14/2017 11/07/17   Debbe Odea, MD    Family History Family History  Problem Relation Age of Onset  . Heart disease Maternal Grandmother   . Heart disease Maternal Grandfather   . Diabetes Mother   . Hyperlipidemia Mother   . Hypertension Father   . Heart disease Father   . Hypertension Paternal Grandmother   . Cancer Paternal Grandfather     Social History Social History   Tobacco Use  . Smoking status: Never Smoker  . Smokeless tobacco: Never Used  Substance Use Topics  . Alcohol use: No  . Drug use: Yes    Types: Marijuana    Comment: 12/18/2016 q few days"     Allergies  Peanut-containing drug products; Food; and Ultram [tramadol]   Review of Systems Review of Systems  Constitutional: Negative for chills and fever.  Gastrointestinal: Positive for abdominal pain (generalized), nausea and vomiting. Negative for blood in stool and diarrhea.  Genitourinary: Negative for difficulty urinating, dysuria, flank pain, frequency and hematuria.  Musculoskeletal: Negative for back pain.  Skin: Negative for color change.  Neurological: Negative for syncope and light-headedness.  All other systems reviewed and are negative.    Physical Exam Updated Vital Signs BP 123/74   Pulse (!) 128   Resp 16   Ht 5\' 3"  (1.6 m)   Wt 58.9 kg   LMP 10/19/2017   SpO2 100%   BMI 23.00 kg/m   Physical Exam  Constitutional: She is oriented to person, place, and time. She appears well-developed and well-nourished. No distress.  Leaning over in bed actively vomiting. Appears uncomfortable.   HENT:  Head: Normocephalic and atraumatic.  Eyes: Right eye exhibits no  discharge. Left eye exhibits no discharge.  Neck: Normal range of motion.  Cardiovascular:  Tachycardic, regular rhythm.   Pulmonary/Chest: Effort normal and breath sounds normal. No stridor. No respiratory distress. She has no wheezes. She has no rales.  Abdominal:  Abdomen soft and non-distended. Abdomen generally tender. No guarding or rigidity.   Neurological: She is alert and oriented to person, place, and time. Coordination normal.  Skin: Skin is warm and dry. She is not diaphoretic.  Psychiatric: She has a normal mood and affect. Her behavior is normal.  Nursing note and vitals reviewed.   ED Treatments / Results  Labs (all labs ordered are listed, but only abnormal results are displayed) Labs Reviewed  COMPREHENSIVE METABOLIC PANEL - Abnormal; Notable for the following components:      Result Value   Glucose, Bld 408 (*)    Total Protein 8.3 (*)    Anion gap 16 (*)    All other components within normal limits  CBC - Abnormal; Notable for the following components:   WBC 14.1 (*)    Platelets 536 (*)    All other components within normal limits  CBG MONITORING, ED - Abnormal; Notable for the following components:   Glucose-Capillary 251 (*)    All other components within normal limits  LIPASE, BLOOD  URINALYSIS, ROUTINE W REFLEX MICROSCOPIC  I-STAT BETA HCG BLOOD, ED ([facility], [facility], [facility] ONLY)  I-STAT CG4 LACTIC ACID, ED  I-STAT CG4 LACTIC ACID, ED    EKG None  Radiology No results found.  Procedures Procedures (including critical care time)  Medications Ordered in ED Medications  ondansetron (ZOFRAN-ODT) disintegrating tablet 4 mg (0 mg Oral Hold 11/14/17 2229)  ketorolac (TORADOL) 15 MG/ML injection 15 mg (has no administration in time range)  sodium chloride 0.9 % bolus 1,000 mL (0 mLs Intravenous Stopped 11/14/17 2311)  sodium chloride 0.9 % bolus 1,000 mL (1,000 mLs Intravenous New Bag/Given 11/14/17 2138)  haloperidol lactate (HALDOL) injection 2 mg (2 mg  Intramuscular Given 11/14/17 2159)  morphine 4 MG/ML injection 4 mg (4 mg Intramuscular Given 11/14/17 2200)  insulin aspart (novoLOG) injection 10 Units (10 Units Intravenous Given 11/14/17 2311)    Initial Impression / Assessment and Plan / ED Course  I have reviewed the triage vital signs and the nursing notes.  Pertinent labs & imaging results that were available during my care of the patient were reviewed by me and considered in my medical decision making (see chart for details).     Pt with a history  of T1DM, gastroparesis with associated nausea and vomiting requiring frequent admission presents with intractable vomiting and generalized abdominal pain. Reports symptoms are similar to prior flare ups. On exam she is actively vomiting, appears distressed. She is tachycardic to 130. Abdomen generally tender, no peritoneal signs.   Lab work shows WBC 14.1, this is similar to her baseline and suspect stress reaction from repeated vomiting. CMP reveals elevated glucose of 408 with anion gap of 16. No lactic acidosis. Likely anion gap related to ketosis given insulin pump fell out earlier today and she has not had insulin in many hours. Awaiting UA to check for ketones. Lipase negative. BHcg neg. Patient currently being treated with 2L fluid bolus, haldol, zofran, insulin, morphine. On recheck she is no longer vomiting, but reports pain is not improved. Creatinine normal, will try toradol.   Do not think that patient needs CT imaging abd/pelvis given symptoms similar to prior gastroparesis flares and no peritoneal signs or focal tenderness on exam. Sign out given at shift change to PA [facility]. If vital signs improve and she is able to tolerate POs she can go home. She may require admission if symptoms are not controlled in the ED given her history. This was a shared visit with Dr. Wilson Singer who also saw the patient and agrees with plan.  Final Clinical Impressions(s) / ED Diagnoses   Final  diagnoses:  None    ED Discharge Orders    None       Bernarda Caffey 11/14/17 2314    Virgel Manifold, MD 11/18/17 [phone]

## 2017-11-14 NOTE — ED Triage Notes (Addendum)
Vomiting started this morning not relieved by Phenergan. Pt heaving and spitting frothy salvia in emesis bag with dramatic movement

## 2017-11-14 NOTE — ED Provider Notes (Signed)
Patient signed out at end of shift by Geanie Kenning, PA-C.  History of T1DM, gastroparesis with recurrent episodes of N, V Last admission this month Here with generalized AP, N, V CBG 406 Insulin pump dislodged earlier while vomiting Anion gap of 16 - normal bicarb - is getting hydration, insulin 10 units, haldol, MS, Zofran Currently, still having pain, vomiting improved Pending UA Consider this a flare up of gastroparesis - no CT felt necessary tonight Seen by Dr. Wilson Singer  Plan:  Recheck/monitor CBG - meds given, fluids started 10:45 Pain management PO challenge Recheck VS, tachycardia  1:00 - patient feels better. Pain resolved. Abdominal exam is benign. No further vomiting. She is up to the bathroom without need of assistance.   She is still significantly tachycardic. A 3rd liter is ordered.   2:30 - She has nausea with emesis again and recurrence of abdominal pain. Still tachycardic. Will admit for further management.     Charlann Lange, PA-C 11/15/17 0246    Virgel Manifold, MD 11/18/17 [phone]

## 2017-11-14 NOTE — ED Notes (Signed)
Pt urinated in bed, pt was aware urine sample was needed. Linens and gown changed and washcloth provided to clean herself up.

## 2017-11-15 ENCOUNTER — Encounter (HOSPITAL_COMMUNITY): Payer: Self-pay | Admitting: Internal Medicine

## 2017-11-15 DIAGNOSIS — K3184 Gastroparesis: Secondary | ICD-10-CM | POA: Diagnosis not present

## 2017-11-15 DIAGNOSIS — J4520 Mild intermittent asthma, uncomplicated: Secondary | ICD-10-CM | POA: Diagnosis not present

## 2017-11-15 DIAGNOSIS — E860 Dehydration: Secondary | ICD-10-CM | POA: Diagnosis present

## 2017-11-15 DIAGNOSIS — E1065 Type 1 diabetes mellitus with hyperglycemia: Secondary | ICD-10-CM | POA: Diagnosis not present

## 2017-11-15 DIAGNOSIS — Z79899 Other long term (current) drug therapy: Secondary | ICD-10-CM | POA: Diagnosis not present

## 2017-11-15 DIAGNOSIS — K760 Fatty (change of) liver, not elsewhere classified: Secondary | ICD-10-CM | POA: Diagnosis present

## 2017-11-15 DIAGNOSIS — J45909 Unspecified asthma, uncomplicated: Secondary | ICD-10-CM | POA: Diagnosis present

## 2017-11-15 DIAGNOSIS — Z9049 Acquired absence of other specified parts of digestive tract: Secondary | ICD-10-CM | POA: Diagnosis not present

## 2017-11-15 DIAGNOSIS — K219 Gastro-esophageal reflux disease without esophagitis: Secondary | ICD-10-CM | POA: Diagnosis present

## 2017-11-15 DIAGNOSIS — Z8249 Family history of ischemic heart disease and other diseases of the circulatory system: Secondary | ICD-10-CM | POA: Diagnosis not present

## 2017-11-15 DIAGNOSIS — F1290 Cannabis use, unspecified, uncomplicated: Secondary | ICD-10-CM | POA: Diagnosis present

## 2017-11-15 DIAGNOSIS — F5089 Other specified eating disorder: Secondary | ICD-10-CM | POA: Diagnosis not present

## 2017-11-15 DIAGNOSIS — E10649 Type 1 diabetes mellitus with hypoglycemia without coma: Secondary | ICD-10-CM | POA: Diagnosis present

## 2017-11-15 DIAGNOSIS — Z91018 Allergy to other foods: Secondary | ICD-10-CM | POA: Diagnosis not present

## 2017-11-15 DIAGNOSIS — Z8349 Family history of other endocrine, nutritional and metabolic diseases: Secondary | ICD-10-CM | POA: Diagnosis not present

## 2017-11-15 DIAGNOSIS — K5900 Constipation, unspecified: Secondary | ICD-10-CM | POA: Diagnosis present

## 2017-11-15 DIAGNOSIS — Z91010 Allergy to peanuts: Secondary | ICD-10-CM | POA: Diagnosis not present

## 2017-11-15 DIAGNOSIS — E43 Unspecified severe protein-calorie malnutrition: Secondary | ICD-10-CM | POA: Diagnosis present

## 2017-11-15 DIAGNOSIS — R112 Nausea with vomiting, unspecified: Secondary | ICD-10-CM | POA: Diagnosis present

## 2017-11-15 DIAGNOSIS — R339 Retention of urine, unspecified: Secondary | ICD-10-CM | POA: Diagnosis present

## 2017-11-15 DIAGNOSIS — E1043 Type 1 diabetes mellitus with diabetic autonomic (poly)neuropathy: Secondary | ICD-10-CM | POA: Diagnosis present

## 2017-11-15 DIAGNOSIS — R000 Tachycardia, unspecified: Secondary | ICD-10-CM | POA: Diagnosis present

## 2017-11-15 DIAGNOSIS — Z833 Family history of diabetes mellitus: Secondary | ICD-10-CM | POA: Diagnosis not present

## 2017-11-15 DIAGNOSIS — D72829 Elevated white blood cell count, unspecified: Secondary | ICD-10-CM | POA: Diagnosis present

## 2017-11-15 DIAGNOSIS — Z885 Allergy status to narcotic agent status: Secondary | ICD-10-CM | POA: Diagnosis not present

## 2017-11-15 DIAGNOSIS — Z794 Long term (current) use of insulin: Secondary | ICD-10-CM | POA: Diagnosis not present

## 2017-11-15 DIAGNOSIS — Z6823 Body mass index (BMI) 23.0-23.9, adult: Secondary | ICD-10-CM | POA: Diagnosis not present

## 2017-11-15 DIAGNOSIS — E1143 Type 2 diabetes mellitus with diabetic autonomic (poly)neuropathy: Secondary | ICD-10-CM | POA: Diagnosis not present

## 2017-11-15 LAB — RAPID URINE DRUG SCREEN, HOSP PERFORMED
AMPHETAMINES: NOT DETECTED
BENZODIAZEPINES: NOT DETECTED
Barbiturates: NOT DETECTED
Cocaine: NOT DETECTED
Opiates: POSITIVE — AB
TETRAHYDROCANNABINOL: POSITIVE — AB

## 2017-11-15 LAB — CBC
HCT: 37.3 % (ref 36.0–46.0)
Hemoglobin: 11.3 g/dL — ABNORMAL LOW (ref 12.0–15.0)
MCH: 28.4 pg (ref 26.0–34.0)
MCHC: 30.3 g/dL (ref 30.0–36.0)
MCV: 93.7 fL (ref 80.0–100.0)
Platelets: 428 10*3/uL — ABNORMAL HIGH (ref 150–400)
RBC: 3.98 MIL/uL (ref 3.87–5.11)
RDW: 13.8 % (ref 11.5–15.5)
WBC: 20.8 10*3/uL — ABNORMAL HIGH (ref 4.0–10.5)
nRBC: 0 % (ref 0.0–0.2)

## 2017-11-15 LAB — URINALYSIS, ROUTINE W REFLEX MICROSCOPIC
Bacteria, UA: NONE SEEN
Bilirubin Urine: NEGATIVE
Glucose, UA: >=500 mg/dL — AB
Ketones, ur: 80 mg/dL — AB
Nitrite: NEGATIVE
Protein, ur: 100 mg/dL — AB
RBC / HPF: >50 RBC/hpf — ABNORMAL HIGH (ref 0–5)
Specific Gravity, Urine: 1.023 (ref 1.005–1.030)
pH: 6 (ref 5.0–8.0)

## 2017-11-15 LAB — CREATININE, SERUM
Creatinine, Ser: 0.59 mg/dL (ref 0.44–1.00)
GFR calc Af Amer: >60 mL/min (ref >60)
GFR calc non Af Amer: >60 mL/min (ref >60)

## 2017-11-15 LAB — BASIC METABOLIC PANEL
ANION GAP: 12 (ref 5–15)
Anion gap: 12 (ref 5–15)
BUN: 18 mg/dL (ref 6–20)
BUN: 15 mg/dL (ref 6–20)
CALCIUM: 8.3 mg/dL — AB (ref 8.9–10.3)
CHLORIDE: 111 mmol/L (ref 98–111)
CHLORIDE: 113 mmol/L — AB (ref 98–111)
CO2: 16 mmol/L — ABNORMAL LOW (ref 22–32)
CO2: 14 mmol/L — ABNORMAL LOW (ref 22–32)
CREATININE: 0.75 mg/dL (ref 0.44–1.00)
Calcium: 8.6 mg/dL — ABNORMAL LOW (ref 8.9–10.3)
Creatinine, Ser: 0.67 mg/dL (ref 0.44–1.00)
GFR calc Af Amer: >60 mL/min (ref >60)
GFR calc Af Amer: >60 mL/min (ref >60)
GFR calc non Af Amer: >60 mL/min (ref >60)
GLUCOSE: 158 mg/dL — AB (ref 70–99)
Glucose, Bld: 239 mg/dL — ABNORMAL HIGH (ref 70–99)
POTASSIUM: 4.5 mmol/L (ref 3.5–5.1)
Potassium: 3.6 mmol/L (ref 3.5–5.1)
Sodium: 139 mmol/L (ref 135–145)
Sodium: 139 mmol/L (ref 135–145)

## 2017-11-15 LAB — HEPATIC FUNCTION PANEL
ALBUMIN: 3.5 g/dL (ref 3.5–5.0)
ALT: 15 U/L (ref 0–44)
AST: 22 U/L (ref 15–41)
Alkaline Phosphatase: 63 U/L (ref 38–126)
BILIRUBIN INDIRECT: 0.7 mg/dL (ref 0.3–0.9)
Bilirubin, Direct: 0.1 mg/dL (ref 0.0–0.2)
TOTAL PROTEIN: 6.4 g/dL — AB (ref 6.5–8.1)
Total Bilirubin: 0.8 mg/dL (ref 0.3–1.2)

## 2017-11-15 LAB — TSH: TSH: 0.358 u[IU]/mL (ref 0.350–4.500)

## 2017-11-15 LAB — GLUCOSE, CAPILLARY
GLUCOSE-CAPILLARY: 144 mg/dL — AB (ref 70–99)
GLUCOSE-CAPILLARY: 41 mg/dL — AB (ref 70–99)
Glucose-Capillary: 199 mg/dL — ABNORMAL HIGH (ref 70–99)
Glucose-Capillary: 124 mg/dL — ABNORMAL HIGH (ref 70–99)
Glucose-Capillary: 78 mg/dL (ref 70–99)
Glucose-Capillary: 122 mg/dL — ABNORMAL HIGH (ref 70–99)

## 2017-11-15 LAB — CBG MONITORING, ED
GLUCOSE-CAPILLARY: 210 mg/dL — AB (ref 70–99)
GLUCOSE-CAPILLARY: 247 mg/dL — AB (ref 70–99)

## 2017-11-15 MED ORDER — METOCLOPRAMIDE HCL 5 MG/ML IJ SOLN
5.0000 mg | Freq: Four times a day (QID) | INTRAMUSCULAR | Status: AC
  Administered 2017-11-15 (×2): 5 mg via INTRAVENOUS
  Filled 2017-11-15 (×2): qty 2

## 2017-11-15 MED ORDER — ONDANSETRON HCL 4 MG PO TABS: 4.0000 mg | ORAL_TABLET | Freq: Four times a day (QID) | ORAL | Status: DC | PRN

## 2017-11-15 MED ORDER — LACTATED RINGERS IV SOLN
INTRAVENOUS | Status: AC
  Administered 2017-11-15 (×4): via INTRAVENOUS

## 2017-11-15 MED ORDER — ENOXAPARIN SODIUM 40 MG/0.4ML ~~LOC~~ SOLN
40.0000 mg | SUBCUTANEOUS | Status: DC
  Administered 2017-11-15 – 2017-11-21 (×7): 40 mg via SUBCUTANEOUS
  Filled 2017-11-15 (×7): qty 0.4

## 2017-11-15 MED ORDER — ONDANSETRON HCL 4 MG/2ML IJ SOLN
4.0000 mg | Freq: Four times a day (QID) | INTRAMUSCULAR | Status: DC | PRN
  Administered 2017-11-15 – 2017-11-16 (×4): 4 mg via INTRAVENOUS
  Filled 2017-11-15 (×4): qty 2

## 2017-11-15 MED ORDER — DEXTROSE 50 % IV SOLN
INTRAVENOUS | Status: AC
  Administered 2017-11-15: 25 mL
  Filled 2017-11-15: qty 50

## 2017-11-15 MED ORDER — FAMOTIDINE IN NACL 20-0.9 MG/50ML-% IV SOLN
20.0000 mg | Freq: Two times a day (BID) | INTRAVENOUS | Status: DC
  Administered 2017-11-15 – 2017-11-21 (×12): 20 mg via INTRAVENOUS
  Filled 2017-11-15 (×13): qty 50

## 2017-11-15 MED ORDER — METOCLOPRAMIDE HCL 5 MG/ML IJ SOLN
10.0000 mg | Freq: Once | INTRAMUSCULAR | Status: AC
  Administered 2017-11-15: 10 mg via INTRAVENOUS
  Filled 2017-11-15: qty 2

## 2017-11-15 MED ORDER — SODIUM CHLORIDE 0.9 % IV BOLUS
1000.0000 mL | Freq: Once | INTRAVENOUS | Status: AC
  Administered 2017-11-15: 1000 mL via INTRAVENOUS

## 2017-11-15 MED ORDER — INSULIN PUMP
SUBCUTANEOUS | Status: DC
  Administered 2017-11-15 (×2): 0.4 via SUBCUTANEOUS
  Administered 2017-11-16: 0.8 via SUBCUTANEOUS
  Administered 2017-11-17: 1.6 via SUBCUTANEOUS
  Administered 2017-11-17: 0.3 via SUBCUTANEOUS
  Administered 2017-11-18 (×2): via SUBCUTANEOUS
  Administered 2017-11-19 (×2): 0.9 via SUBCUTANEOUS
  Administered 2017-11-19: 0.5 via SUBCUTANEOUS
  Administered 2017-11-20: 2.3 via SUBCUTANEOUS
  Administered 2017-11-20: via SUBCUTANEOUS
  Administered 2017-11-20: 0.9 via SUBCUTANEOUS
  Administered 2017-11-20 (×2): via SUBCUTANEOUS
  Administered 2017-11-21: 0.5 via SUBCUTANEOUS
  Filled 2017-11-15: qty 1

## 2017-11-15 MED ORDER — KETOROLAC TROMETHAMINE 15 MG/ML IJ SOLN
15.0000 mg | Freq: Four times a day (QID) | INTRAMUSCULAR | Status: DC | PRN
  Administered 2017-11-15: 15 mg via INTRAVENOUS
  Filled 2017-11-15: qty 1

## 2017-11-15 MED ORDER — ALBUTEROL SULFATE HFA 108 (90 BASE) MCG/ACT IN AERS: 1.0000 | INHALATION_SPRAY | Freq: Four times a day (QID) | RESPIRATORY_TRACT | Status: DC | PRN

## 2017-11-15 MED ORDER — ALBUTEROL SULFATE (2.5 MG/3ML) 0.083% IN NEBU: 2.5000 mg | INHALATION_SOLUTION | Freq: Four times a day (QID) | RESPIRATORY_TRACT | Status: DC | PRN

## 2017-11-15 MED ORDER — HYDROMORPHONE HCL 1 MG/ML IJ SOLN
0.5000 mg | INTRAMUSCULAR | Status: DC | PRN
  Administered 2017-11-15 – 2017-11-18 (×19): 0.5 mg via INTRAVENOUS
  Filled 2017-11-15 (×21): qty 0.5

## 2017-11-15 MED ORDER — METOCLOPRAMIDE HCL 5 MG/ML IJ SOLN
10.0000 mg | Freq: Four times a day (QID) | INTRAMUSCULAR | Status: DC
  Administered 2017-11-15 – 2017-11-21 (×19): 10 mg via INTRAVENOUS
  Filled 2017-11-15 (×20): qty 2

## 2017-11-15 MED ORDER — ACETAMINOPHEN 325 MG PO TABS: 650.0000 mg | ORAL_TABLET | Freq: Four times a day (QID) | ORAL | Status: DC | PRN

## 2017-11-15 MED ORDER — POLYETHYLENE GLYCOL 3350 17 G PO PACK: 17.0000 g | PACK | Freq: Every day | ORAL | Status: DC | PRN

## 2017-11-15 MED ORDER — DIPHENHYDRAMINE HCL 50 MG/ML IJ SOLN
12.5000 mg | Freq: Four times a day (QID) | INTRAMUSCULAR | Status: DC | PRN
  Administered 2017-11-15 – 2017-11-19 (×7): 12.5 mg via INTRAVENOUS
  Filled 2017-11-15 (×7): qty 1

## 2017-11-15 MED ORDER — ONDANSETRON 4 MG PO TBDP
4.0000 mg | ORAL_TABLET | Freq: Four times a day (QID) | ORAL | Status: DC | PRN
  Administered 2017-11-16 – 2017-11-20 (×9): 4 mg via ORAL
  Filled 2017-11-15 (×11): qty 1

## 2017-11-15 MED ORDER — ACETAMINOPHEN 650 MG RE SUPP: 650.0000 mg | Freq: Four times a day (QID) | RECTAL | Status: DC | PRN

## 2017-11-15 NOTE — ED Notes (Signed)
ED TO INPATIENT HANDOFF REPORT  Name/Age/Gender [name] 27 y.o. female  Code Status Code Status History    Date Active Date Inactive Code Status Order ID Comments User Context   11/02/2017 1757 11/07/2017 1354 Full Code [id number]  Charlynne Cousins, MD Inpatient   01/21/2017 2304 01/29/2017 1728 Full Code [id number]  Rise Patience, MD ED   12/18/2016 0704 12/20/2016 1727 Full Code [id number]  Rondel Jumbo, PA-C ED   11/05/2016 1525 11/11/2016 1456 Full Code [id number]  Phillips Grout, MD Inpatient   10/23/2016 1342 10/29/2016 2103 Full Code [id number]  Elwin Mocha, MD ED   09/26/2016 1503 09/29/2016 1533 Full Code [id number]  Georgette Shell, MD ED   07/15/2016 1712 07/18/2016 2035 Full Code [id number]  Damita Lack, MD Inpatient   07/05/2016 1658 07/11/2016 1620 Full Code [id number]  Bonnielee Haff, MD Inpatient   06/23/2016 0856 06/28/2016 1346 Full Code [id number]  Janece Canterbury, MD Inpatient   06/12/2016 2137 06/14/2016 1723 Full Code [id number]  Sid Falcon, MD Inpatient   06/03/2016 2044 06/06/2016 2041 Full Code [id number]  Gardiner Barefoot, NP Inpatient   05/31/2016 2348 06/03/2016 2044 Full Code [id number]  Rise Patience, MD Inpatient   05/14/2016 0212 05/16/2016 1718 Full Code [id number]  Toy Baker, MD Inpatient   05/04/2016 2140 05/10/2016 2004 Full Code [id number]  Orson Eva, MD Inpatient   02/29/2016 2201 03/04/2016 1646 Full Code [id number]  Toy Baker, MD Inpatient   02/20/2016 1441 02/25/2016 1723 Full Code [id number]  Elgergawy, Silver Huguenin, MD Inpatient   01/22/2016 1021 01/25/2016 1607 Full Code [id number]  Orson Eva, MD Inpatient   01/17/2016 0634 01/19/2016 1944 Full Code [id number]  Etta Quill, DO ED   11/18/2015 2238 11/23/2015 1231 Full Code [id number]  Bonnielee Haff, MD Inpatient   11/09/2015 2048 11/12/2015 1535 Full Code [id number]  Toy Baker, MD Inpatient   11/09/2015 2048 11/09/2015 2048 Full Code [id number]  Toy Baker, MD Inpatient   11/03/2015 0238 11/06/2015 1301 Full Code [id number]  Jani Gravel, MD Inpatient   11/02/2015 0128 11/03/2015 0238 Full Code [id number]  Phillips Grout, MD Inpatient   07/10/2015 1645 07/13/2015 1431 Full Code [id number]  Ivor Costa, MD ED   07/01/2015 2203 07/06/2015 1522 Full Code [id number]  Gennaro Africa, MD ED   06/22/2015 0624 06/24/2015 1531 Full Code [id number]  Etta Quill, DO Inpatient   06/10/2015 0156 06/11/2015 1519 Full Code [id number]  Reubin Milan, MD Inpatient   05/28/2015 1609 06/03/2015 1932 Full Code [id number]  Barton Dubois, MD Inpatient   05/24/2015 2359 05/25/2015 1858 Full Code [id number]  Theressa Millard, MD Inpatient   04/21/2015 0931 04/23/2015 1445 Full Code [id number]  Gennaro Africa, MD ED   04/08/2015 2229 04/15/2015 1605 Full Code [id number]  Reubin Milan, MD ED   03/22/2015 0243 03/25/2015 1811 Full Code [id number]  Rise Patience, MD ED   02/11/2015 1530 02/12/2015 1805 Full Code [id number]  Greer Pickerel, MD Inpatient   11/24/2014 1309 11/29/2014 1929 Full Code [id number]  Rama, Venetia Maxon, MD Inpatient   09/16/2014 0530 09/21/2014 2007 Full Code [id number]  Etta Quill, DO ED   06/29/2012 1400 07/01/2012 1517 Full Code [id number]  Velvet Bathe, MD Inpatient   12/08/2011 1543 12/12/2011 1450 Full Code [id number]  Chyrel Masson, RN Inpatient      Home/SNF/Other Home  Chief Complaint Gastroparesis  Level of Care/Admitting Diagnosis ED Disposition    ED Disposition Condition Comment   Admit  Hospital Area: [facility] [[id number]]  Level of Care: Telemetry [5]  Admit to tele based on following criteria: Monitor for Ischemic changes  Diagnosis: Nausea & vomiting [417408]  Admitting Physician: Rise Patience [phone]  Attending Physician: Rise Patience [phone]  PT Class (Do Not Modify): Observation [104]  PT Acc Code (Do Not Modify): Observation [10022]       Medical History Past Medical History:  Diagnosis  Date  . Anxiety   . Arthritis    "hands, feet, knees" (12/18/2016)  . Asthma   . Diabetic gastroparesis ([place])    Per gastric emptying study 07/09/16 which showed significant delayed gastric emptying.  . Gallstones   . Gastroparesis   . GERD (gastroesophageal reflux disease)   . Heart murmur   . Hepatic steatosis 11/26/2014   and hepatomegaly  . Hypertension    hx (12/18/2016)  . Intractable cyclical vomiting syndrome    Archie Endo 12/18/2016  . Liver mass 11/26/2014  . Pancreatitis, acute 11/26/2014  . Pneumonia    "as a teen X 1" (12/18/2016)  . Type I diabetes mellitus ([place]) 2007   IDDM.  poorly controlled, multiple admits with DKA    Allergies Allergies  Allergen Reactions  . Peanut-Containing Drug Products Swelling and Other (See Comments)    Reaction:  Swelling of mouth and lips   . 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   Reaction:  Swelling of mouth and lips   . Ultram [Tramadol] Itching    IV Location/Drains/Wounds Patient Lines/Drains/Airways Status   Active Line/Drains/Airways    Name:   Placement date:   Placement time:   Site:   Days:   Peripheral IV 11/14/17 Right;Upper Arm   11/14/17    2133    Arm   1          Labs/Imaging Results for orders placed or performed during the hospital encounter of 11/14/17 (from the past 48 hour(s))  Urinalysis, Routine w reflex microscopic     Status: Abnormal   Collection Time: 11/14/17  6:15 PM  Result Value Ref Range   Color, Urine RED (A) YELLOW    Comment: BIOCHEMICALS MAY BE AFFECTED BY COLOR   APPearance CLOUDY (A) CLEAR   Specific Gravity, Urine 1.023 1.005 - 1.030   pH 6.0 5.0 - 8.0   Glucose, UA >=500 (A) NEGATIVE mg/dL   Hgb urine dipstick LARGE (A) NEGATIVE   Bilirubin Urine NEGATIVE NEGATIVE   Ketones, ur 80 (A) NEGATIVE mg/dL   Protein, ur 100 (A) NEGATIVE mg/dL   Nitrite NEGATIVE NEGATIVE   Leukocytes, UA TRACE (A) NEGATIVE   RBC / HPF >50 (H) 0 - 5 RBC/hpf   WBC,  UA 6-10 0 - 5 WBC/hpf   Bacteria, UA NONE SEEN NONE SEEN   Squamous Epithelial / LPF 6-10 0 - 5   Budding Yeast PRESENT     Comment: Performed at [facility], [place] [address], [place], [place] [address]  CBG monitoring, ED     Status: Abnormal   Collection Time: 11/14/17  7:02 PM  Result Value Ref Range   Glucose-Capillary 251 (H) 70 - 99 mg/dL   Comment 1 Notify RN   Lipase, blood     Status: None   Collection Time: 11/14/17  9:33 PM  Result Value Ref Range   Lipase 17 11 - 51 U/L    Comment: Performed at [facility], [place] [address] [place],  [place] [address]  Comprehensive metabolic panel     Status: Abnormal   Collection Time: 11/14/17  9:33 PM  Result Value Ref Range   Sodium 137 135 - 145 mmol/L   Potassium 4.2 3.5 - 5.1 mmol/L   Chloride 99 98 - 111 mmol/L   CO2 22 22 - 32 mmol/L   Glucose, Bld 408 (H) 70 - 99 mg/dL   BUN 18 6 - 20 mg/dL   Creatinine, Ser 0.84 0.44 - 1.00 mg/dL   Calcium 9.9 8.9 - 10.3 mg/dL   Total Protein 8.3 (H) 6.5 - 8.1 g/dL   Albumin 4.8 3.5 - 5.0 g/dL   AST 21 15 - 41 U/L   ALT 16 0 - 44 U/L   Alkaline Phosphatase 79 38 - 126 U/L   Total Bilirubin 1.0 0.3 - 1.2 mg/dL   GFR calc non Af Amer >60 >60 mL/min   GFR calc Af Amer >60 >60 mL/min    Comment: (NOTE) The eGFR has been calculated using the CKD EPI equation. This calculation has not been validated in all clinical situations. eGFR's persistently <60 mL/min signify possible Chronic Kidney Disease.    Anion gap 16 (H) 5 - 15    Comment: Performed at [facility], [place] [address], [place], [place] [address]  CBC     Status: Abnormal   Collection Time: 11/14/17  9:33 PM  Result Value Ref Range   WBC 14.1 (H) 4.0 - 10.5 K/uL   RBC 4.57 3.87 - 5.11 MIL/uL   Hemoglobin 12.9 12.0 - 15.0 g/dL   HCT 40.8 36.0 - 46.0 %   MCV 89.3 80.0 - 100.0 fL   MCH 28.2 26.0 - 34.0 pg   MCHC 31.6 30.0 - 36.0 g/dL   RDW 13.6 11.5 - 15.5 %   Platelets  536 (H) 150 - 400 K/uL   nRBC 0.0 0.0 - 0.2 %    Comment: Performed at [facility] [place] [address], [place], [place] [address]  I-Stat beta hCG blood, ED     Status: None   Collection Time: 11/14/17 10:30 PM  Result Value Ref Range   I-stat hCG, quantitative <5.0 <5 mIU/mL   Comment 3            Comment:   GEST. AGE      CONC.  (mIU/mL)   <=1 WEEK        5 - 50     2 WEEKS       50 - 500     3 WEEKS       100 - 10,000     4 WEEKS     1,000 - 30,000        FEMALE AND NON-PREGNANT FEMALE:     LESS THAN 5 mIU/mL   I-Stat CG4 Lactic Acid, ED     Status: None   Collection Time: 11/14/17 10:34 PM  Result Value Ref Range   Lactic Acid, Venous 1.29 0.5 - 1.9 mmol/L  POC CBG, ED     Status: Abnormal   Collection Time: 11/15/17 12:41 AM  Result Value Ref Range   Glucose-Capillary 210 (H) 70 - 99 mg/dL   Comment 1 Notify RN   POC CBG, ED     Status: Abnormal   Collection Time: 11/15/17  1:46 AM  Result Value Ref Range   Glucose-Capillary 247 (H) 70 - 99 mg/dL  Basic metabolic panel     Status: Abnormal   Collection Time:  11/15/17  3:03 AM  Result Value Ref Range   Sodium 139 135 - 145 mmol/L   Potassium 3.6 3.5 - 5.1 mmol/L   Chloride 111 98 - 111 mmol/L   CO2 16 (L) 22 - 32 mmol/L   Glucose, Bld 239 (H) 70 - 99 mg/dL   BUN 18 6 - 20 mg/dL   Creatinine, Ser 0.75 0.44 - 1.00 mg/dL   Calcium 8.3 (L) 8.9 - 10.3 mg/dL   GFR calc non Af Amer >60 >60 mL/min   GFR calc Af Amer >60 >60 mL/min    Comment: (NOTE) The eGFR has been calculated using the CKD EPI equation. This calculation has not been validated in all clinical situations. eGFR's persistently <60 mL/min signify possible Chronic Kidney Disease.    Anion gap 12 5 - 15    Comment: Performed at [facility], [place] [address], [place], [place] [address]   No results found. None  Pending Labs Unresulted Labs (From admission, onward)   None      Vitals/Pain Today's Vitals   11/15/17  0141 11/15/17 0145 11/15/17 0200 11/15/17 0230  BP:   126/83 124/60  Pulse:  (!) 132 (!) 130 (!) 134  Resp:   17 16  Temp: 98.7 F (37.1 C)     TempSrc: Rectal     SpO2:  100% 100% 100%  Weight:      Height:      PainSc:        Isolation Precautions No active isolations  Medications Medications  sodium chloride 0.9 % bolus 1,000 mL (0 mLs Intravenous Stopped 11/14/17 2311)  sodium chloride 0.9 % bolus 1,000 mL (0 mLs Intravenous Stopped 11/15/17 0000)  haloperidol lactate (HALDOL) injection 2 mg (2 mg Intramuscular Given 11/14/17 2159)  morphine 4 MG/ML injection 4 mg (4 mg Intramuscular Given 11/14/17 2200)  ondansetron (ZOFRAN-ODT) disintegrating tablet 4 mg (4 mg Oral Given 11/15/17 0119)  insulin aspart (novoLOG) injection 10 Units (10 Units Intravenous Given 11/14/17 2311)  ketorolac (TORADOL) 15 MG/ML injection 15 mg (15 mg Intravenous Given 11/14/17 2315)  sodium chloride 0.9 % bolus 1,000 mL (0 mLs Intravenous Stopped 11/15/17 0300)  metoCLOPramide (REGLAN) injection 10 mg (10 mg Intravenous Given 11/15/17 0304)    Mobility walks

## 2017-11-15 NOTE — Progress Notes (Signed)
HR= 123; patient has history of ST; alert and oriented x 4 when RN assesses patient at this time. Patient is on telemetry.

## 2017-11-15 NOTE — ED Notes (Addendum)
Spoke to Dr. Hal Hope, will not transfer pt to telle until BMP results

## 2017-11-15 NOTE — H&P (Signed)
History and Physical    [name] STM:[id number] DOB: October 15, 1990 DOA: 11/14/2017  PCP: Vicenta Aly, FNP  Patient coming from: Home.  Chief Complaint: Nausea vomiting and abdominal pain.  HPI: [name] is a 27 y.o. female with history of diabetes mellitus type 1 and gastroparesis who was just discharged a week ago after being admitted for intractable nausea vomiting presents with worsening nausea vomiting over the last 2 days with generalized abdominal discomfort mostly crampy in nature.  Denies any blood in the vomitus or denies any diarrhea.  Denies taking any marijuana recently.  Patient states she has been on her insulin pump.  ED Course: In the ER patient was tachycardic and has been tachycardic during last admission also.  Anion gap was 16 which improved to 3:12 liters fluid bolus.  Abdomen appears soft on exam on LFTs and lipase are normal.  Urine shows ketosis.  Patient is being admitted for intractable nausea vomiting likely from gastroparesis and possible marijuana abuse.  Review of Systems: As per HPI, rest all negative.   Past Medical History:  Diagnosis Date  . Anxiety   . Arthritis    "hands, feet, knees" (12/18/2016)  . Asthma   . Diabetic gastroparesis ([place])    Per gastric emptying study 07/09/16 which showed significant delayed gastric emptying.  . Gallstones   . Gastroparesis   . GERD (gastroesophageal reflux disease)   . Heart murmur   . Hepatic steatosis 11/26/2014   and hepatomegaly  . Hypertension    hx (12/18/2016)  . Intractable cyclical vomiting syndrome    Archie Endo 12/18/2016  . Liver mass 11/26/2014  . Pancreatitis, acute 11/26/2014  . Pneumonia    "as a teen X 1" (12/18/2016)  . Type I diabetes mellitus ([place]) 2007   IDDM.  poorly controlled, multiple admits with DKA    Past Surgical History:  Procedure Laterality Date  . CHOLECYSTECTOMY N/A 02/11/2015   Procedure: LAPAROSCOPIC CHOLECYSTECTOMY WITH INTRAOPERATIVE CHOLANGIOGRAM;   Surgeon: Greer Pickerel, MD;  Location: [facility] OR[facility];  Service: General;  Laterality: N/A;  . ESOPHAGOGASTRODUODENOSCOPY (EGD) WITH PROPOFOL Left 09/20/2014   Procedure: ESOPHAGOGASTRODUODENOSCOPY (EGD) WITH PROPOFOL;  Surgeon: Arta Silence, MD;  Location: [facility] ENDOSCOPY;  Service: Endoscopy;  Laterality: Left;  . WISDOM TOOTH EXTRACTION       reports that she has never smoked. She has never used smokeless tobacco. She reports that she has current or past drug history. Drug: Marijuana. She reports that she does not drink alcohol.  Allergies  Allergen Reactions  . Peanut-Containing Drug Products Swelling and Other (See Comments)    Reaction:  Swelling of mouth and lips   . 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   Reaction:  Swelling of mouth and lips   . Ultram [Tramadol] Itching    Family History  Problem Relation Age of Onset  . Heart disease Maternal Grandmother   . Heart disease Maternal Grandfather   . Diabetes Mother   . Hyperlipidemia Mother   . Hypertension Father   . Heart disease Father   . Hypertension Paternal Grandmother   . Cancer Paternal Grandfather     Prior to Admission medications   Medication Sig Start Date End Date Taking? Authorizing Provider  albuterol (PROVENTIL HFA;VENTOLIN HFA) 108 (90 Base) MCG/ACT inhaler Inhale 1-2 puffs into the lungs every 6 (six) hours as needed for wheezing or shortness of breath.   Yes [provider]  cephALEXin (KEFLEX) 500 MG capsule Take 1  capsule (500 mg total) by mouth every 12 (twelve) hours. 11/07/17  Yes Debbe Odea, MD  glucagon (GLUCAGON EMERGENCY) 1 MG injection Inject 1 mg into the muscle once as needed (For very low blood sugars. May repeat in 15-20 minutes if needed.). 12/20/16  Yes Hongalgi, Lenis Dickinson, MD  Insulin Human (INSULIN PUMP) SOLN Inject 1 each into the skin 3 times daily with meals, bedtime and 2 AM. 11/07/17  Yes Rizwan, Eunice Blase, MD  metoCLOPramide (REGLAN) 10 MG tablet Take  1 tablet (10 mg total) by mouth 3 (three) times daily with meals. 11/07/17 11/07/18 Yes Debbe Odea, MD  ondansetron (ZOFRAN ODT) 4 MG disintegrating tablet Take 1 tablet (4 mg total) by mouth every 8 (eight) hours as needed for nausea or vomiting. 11/07/17  Yes Debbe Odea, MD  polyethylene glycol (MIRALAX / GLYCOLAX) packet Take 17 g by mouth daily as needed for mild constipation. 11/07/17  Yes Debbe Odea, MD  promethazine (PHENERGAN) 50 MG tablet Take 0.5 tablets (25 mg total) by mouth every 6 (six) hours as needed for nausea or vomiting. 11/07/17  Yes Debbe Odea, MD  traMADol (ULTRAM) 50 MG tablet Take 1-2 tablets (50-100 mg total) by mouth every 6 (six) hours as needed. Patient taking differently: Take 50-100 mg by mouth every 6 (six) hours as needed for moderate pain or severe pain.  11/07/17  Yes Debbe Odea, MD  scopolamine (TRANSDERM-SCOP) 1 MG/3DAYS Place 1 patch (1.5 mg total) onto the skin every 3 (three) days as needed (during episodes of vomiting). Patient not taking: Reported on 11/14/2017 11/07/17   Debbe Odea, MD    Physical Exam: Vitals:   11/15/17 0145 11/15/17 0200 11/15/17 0230 11/15/17 0409  BP:  126/83 124/60 132/68  Pulse: (!) 132 (!) 130 (!) 134 (!) 129  Resp:  17 16 18   Temp:    98.7 F (37.1 C)  TempSrc:      SpO2: 100% 100% 100% 100%  Weight:      Height:          Constitutional: Moderately built and nourished. Vitals:   11/15/17 0145 11/15/17 0200 11/15/17 0230 11/15/17 0409  BP:  126/83 124/60 132/68  Pulse: (!) 132 (!) 130 (!) 134 (!) 129  Resp:  17 16 18   Temp:    98.7 F (37.1 C)  TempSrc:      SpO2: 100% 100% 100% 100%  Weight:      Height:       Eyes: Anicteric no pallor. ENMT: No discharge from the ears eyes nose or mouth. Neck: No mass or.  No neck rigidity but no JVD appreciated. Respiratory: No rhonchi or crepitations. Cardiovascular: S1-S2 heard tachycardic. Abdomen: Soft nontender bowel sounds  present. Musculoskeletal: No edema. Skin: No rash. Neurologic: Alert awake oriented to time place and person.  Moves all extremities. Psychiatric: Appears normal per normal affect.   Labs on Admission: I have personally reviewed following labs and imaging studies  CBC: Recent Labs  Lab 11/14/17 2133  WBC 14.1*  HGB 12.9  HCT 40.8  MCV 89.3  PLT 570*   Basic Metabolic Panel: Recent Labs  Lab 11/14/17 2133 11/15/17 0303  NA 137 139  K 4.2 3.6  CL 99 111  CO2 22 16*  GLUCOSE 408* 239*  BUN 18 18  CREATININE 0.84 0.75  CALCIUM 9.9 8.3*   GFR: Estimated Creatinine Clearance: 86.6 mL/min (by C-G formula based on SCr of 0.75 mg/dL). Liver Function Tests: Recent Labs  Lab 11/14/17 2133  AST  21  ALT 16  ALKPHOS 79  BILITOT 1.0  PROT 8.3*  ALBUMIN 4.8   Recent Labs  Lab 11/14/17 2133  LIPASE 17   No results for input(s): AMMONIA in the last 168 hours. Coagulation Profile: No results for input(s): INR, PROTIME in the last 168 hours. Cardiac Enzymes: No results for input(s): CKTOTAL, CKMB, CKMBINDEX, TROPONINI in the last 168 hours. BNP (last 3 results) No results for input(s): PROBNP in the last 8760 hours. HbA1C: No results for input(s): HGBA1C in the last 72 hours. CBG: Recent Labs  Lab 11/14/17 1902 11/15/17 0041 11/15/17 0146 11/15/17 0431  GLUCAP 251* 210* 247* 199*   Lipid Profile: No results for input(s): CHOL, HDL, LDLCALC, TRIG, CHOLHDL, LDLDIRECT in the last 72 hours. Thyroid Function Tests: No results for input(s): TSH, T4TOTAL, FREET4, T3FREE, THYROIDAB in the last 72 hours. Anemia Panel: No results for input(s): VITAMINB12, FOLATE, FERRITIN, TIBC, IRON, RETICCTPCT in the last 72 hours. Urine analysis:    Component Value Date/Time   COLORURINE RED (A) 11/14/2017 1815   APPEARANCEUR CLOUDY (A) 11/14/2017 1815   LABSPEC 1.023 11/14/2017 1815   PHURINE 6.0 11/14/2017 1815   GLUCOSEU >=500 (A) 11/14/2017 1815   GLUCOSEU >=1000 11/07/2012  1205   HGBUR LARGE (A) 11/14/2017 1815   BILIRUBINUR NEGATIVE 11/14/2017 1815   KETONESUR 80 (A) 11/14/2017 1815   PROTEINUR 100 (A) 11/14/2017 1815   UROBILINOGEN 0.2 11/24/2014 1045   NITRITE NEGATIVE 11/14/2017 1815   LEUKOCYTESUR TRACE (A) 11/14/2017 1815   Sepsis Labs: @LABRCNTIP (procalcitonin:4,lacticidven:4) )No results found for this or any previous visit (from the past 240 hour(s)).   Radiological Exams on Admission: No results found.   Assessment/Plan Principal Problem:   Nausea & vomiting Active Problems:   Uncontrolled type 1 diabetes mellitus (HCC)   Asthma   Sinus tachycardia   Gastroparesis   Gastroparesis due to DM ([place])    1. Intractable nausea vomiting likely from patient's known history of diabetic gastroparesis -we will check urine drug screen for marijuana use.  Could also be contributing if positive.  Will keep patient on clear liquid diet and I have dosed Reglan 5 mg IV every 6 for 2 doses may need further doses based on response.  Pepcid.  PRN Dilaudid for pain has given previous admission.  IV fluids. 2. Diabetes mellitus type 1 uncontrolled anion gap was initially increased which improved with fluids.  I have placed patient on Ringer lactate.  Closely follow metabolic panel for any development of DKA.  Patient is on insulin pump.  Based on the response and follow metabolic panel will have further plans.  Last hemoglobin A1c was 8.1.  Patient did briefly have her insulin pump dislodged in the ER which was reconnected. 3. Sinus tachycardia likely from patient's persistent distress from vomiting.  Check TSH. 4. Severe protein calorie malnutrition.  Will start protein shakes when patient is tolerating diet. 5. History of cannabis abuse will check urine drug screen.   DVT prophylaxis: Lovenox. Code Status: Full code. Family Communication: Discussed with patient. Disposition Plan: Home. Consults called: None. Admission status: Observation.   Rise Patience MD [facility] Pager [phone].  If 7PM-7AM, please contact night-coverage [email or web address] Password [email or web address]  11/15/2017, 5:04 AM

## 2017-11-15 NOTE — Progress Notes (Signed)
Agree with HPI as per Dr. Raliegh Ip  27 yr old ty 1 DM with insulin pump, known Cannabis use recent admit 10/15-10/21 for intractable n/v Usually sees DR. Outlaw per patient Has had extensive OP testing  Represents with similar n/v symptoms as last time  requesting IV pain meds-informed would go very slow on this and not escalate today D/c phenergan IV Would use ODt zofran/Reglan continue LR 125-allow small amounts CLD Will discuss MJ cessation given cyclical vomit assosc with this   Verneita Griffes, MD [facility] 11:56 AM

## 2017-11-15 NOTE — Progress Notes (Signed)
Pt arrived to room 1506. VSS, HR is elevated and has been ED, MD aware. Tele monitoring set up. IVF initiated. Insulin pump set up, contract signed and placed in chart. Pt resting comfortably in room.  Kizzie Ide, RN

## 2017-11-15 NOTE — Progress Notes (Signed)
Inpatient Diabetes Program Recommendations  AACE/ADA: New Consensus Statement on Inpatient Glycemic Control (2015)  Target Ranges:  Prepandial:   less than 140 mg/dL      Peak postprandial:   less than 180 mg/dL (1-2 hours)      Critically ill patients:  140 - 180 mg/dL   Lab Results  Component Value Date   GLUCAP 124 (H) 11/15/2017   HGBA1C 8.1 (H) 11/02/2017    Review of Glycemic Control  Diabetes history: DM1 Outpatient Diabetes medications: Medtronic 670G insulin pump Current orders for Inpatient glycemic control: Insulin pump  Basal Rate: 1.4 units/hour Total Basal insulin per 24 hour period= 33.6 units Insulin to Carb Ratio- 1 unit Novolog for every 10 grams of Carbohydrates Insulin Sensitivity- 1 unit Novolog for every 50 mg/dl > Target CBG of 150 mg/dl  Blood sugars much improved.  Will continue to follow. See pt 10/28 for questions regarding her glycemic control.   Thank you. Lorenda Peck, RD, LDN, CDE Inpatient Diabetes Coordinator [phone]

## 2017-11-15 NOTE — Progress Notes (Signed)
Hypoglycemic Event  CBG: 41  Treatment: D50 IV 25 mL  Symptoms: None  Follow-up CBG: Time: 2040 CBG Result:  Possible Reasons for Event: Inadequate meal intake  Comments/MD notified:     [name]

## 2017-11-15 NOTE — ED Notes (Signed)
Pt tolerating fluids with no difficulty  

## 2017-11-16 LAB — GLUCOSE, CAPILLARY
GLUCOSE-CAPILLARY: 54 mg/dL — AB (ref 70–99)
GLUCOSE-CAPILLARY: 132 mg/dL — AB (ref 70–99)
Glucose-Capillary: 105 mg/dL — ABNORMAL HIGH (ref 70–99)
Glucose-Capillary: 207 mg/dL — ABNORMAL HIGH (ref 70–99)
Glucose-Capillary: 258 mg/dL — ABNORMAL HIGH (ref 70–99)
Glucose-Capillary: 89 mg/dL (ref 70–99)

## 2017-11-16 LAB — BASIC METABOLIC PANEL
Anion gap: 12 (ref 5–15)
BUN: 10 mg/dL (ref 6–20)
CO2: 21 mmol/L — AB (ref 22–32)
Calcium: 8.9 mg/dL (ref 8.9–10.3)
Chloride: 104 mmol/L (ref 98–111)
Creatinine, Ser: 0.58 mg/dL (ref 0.44–1.00)
GFR calc Af Amer: >60 mL/min (ref >60)
GLUCOSE: 174 mg/dL — AB (ref 70–99)
POTASSIUM: 3.6 mmol/L (ref 3.5–5.1)
Sodium: 137 mmol/L (ref 135–145)

## 2017-11-16 LAB — CBC
HCT: 38.8 % (ref 36.0–46.0)
Hemoglobin: 12.3 g/dL (ref 12.0–15.0)
MCH: 28.5 pg (ref 26.0–34.0)
MCHC: 31.7 g/dL (ref 30.0–36.0)
MCV: 89.8 fL (ref 80.0–100.0)
Platelets: 430 10*3/uL — ABNORMAL HIGH (ref 150–400)
RBC: 4.32 MIL/uL (ref 3.87–5.11)
RDW: 13.6 % (ref 11.5–15.5)
WBC: 14.6 10*3/uL — ABNORMAL HIGH (ref 4.0–10.5)
nRBC: 0 % (ref 0.0–0.2)

## 2017-11-16 MED ORDER — DEXTROSE 50 % IV SOLN
INTRAVENOUS | Status: AC
  Administered 2017-11-16: 25 mL
  Filled 2017-11-16: qty 50

## 2017-11-16 MED ORDER — PROMETHAZINE HCL 25 MG RE SUPP
25.0000 mg | Freq: Four times a day (QID) | RECTAL | Status: DC | PRN
  Administered 2017-11-16 – 2017-11-19 (×7): 25 mg via RECTAL
  Filled 2017-11-16 (×7): qty 1

## 2017-11-16 MED ORDER — SODIUM CHLORIDE 0.9 % IV SOLN
8.0000 mg | Freq: Four times a day (QID) | INTRAVENOUS | Status: DC | PRN
  Administered 2017-11-16 (×2): 8 mg via INTRAVENOUS
  Filled 2017-11-16 (×2): qty 4

## 2017-11-16 NOTE — Progress Notes (Signed)
Hypoglycemic Event  CBG: 54  Treatment: D50 IV 25 mL  Symptoms: None  Follow-up CBG: [id number] CBG Result:  Possible Reasons for Event: Inadequate meal intake  Comments/MD notified:    Jorene Minors

## 2017-11-16 NOTE — Progress Notes (Signed)
Hospitalist progress note   [name]  FFM:[id number] DOB: [birth year] DOA: 11/14/2017 PCP: Vicenta Aly, FNP  Brief Narrative:  27 yr old ty 1 DM with insulin pump, known Cannabis use recent admit 10/15-10/21 for intractable n/v Usually sees DR. 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-Only ODt zofran-no phenergan iv pushes Cont current pain meds without escalation--tol some sips only Dm  With gastroparesis and com;icaiton ofHypoglycemia--adjusted insuling pump bmi 23-slightly malnourished  Consultants:   n  Procedures:   n  Antimicrobials:   n   Subjective: Drinking a bit more-pain controlled to some degree  Objective: Vitals:   11/15/17 1600 11/15/17 2011 11/16/17 0429 11/16/17 0900  BP:  122/78 (!) 145/107 122/80  Pulse:  (!) 110 (!) 112   Resp: 18 16 16    Temp:  98.6 F (37 C) 98.6 F (37 C)   TempSrc:      SpO2:  99% 100%   Weight:      Height:        Intake/Output Summary (Last 24 hours) at 11/16/2017 1034 Last data filed at 11/16/2017 0325 Gross per 24 hour  Intake 2801.38 ml  Output 250 ml  Net 2551.38 ml   Filed Weights   11/14/17 1813  Weight: 58.9 kg    Examination: eomi ncat s1 s 2no m/rg abd soft no rebound no guard Neuro intact No le edema   Data Reviewed: I have personally reviewed following labs and imaging studies  CBC: Recent Labs  Lab 11/14/17 2133 11/15/17 0615 11/16/17 0643  WBC 14.1* 20.8* 14.6*  HGB 12.9 11.3* 12.3  HCT 40.8 37.3 38.8  MCV 89.3 93.7 89.8  PLT 536* 428* 570*   Basic Metabolic Panel: Recent Labs  Lab 11/14/17 2133 11/15/17 0303 11/15/17 0615 11/15/17 0925 11/16/17 0643  NA 137 139  --  139 137  K 4.2 3.6  --  4.5 3.6  CL 99 111  --  113* 104  CO2 22 16*  --  14* 21*  GLUCOSE 408* 239*  --  158* 174*  BUN 18 18  --  15 10  CREATININE 0.84 0.75 0.59  0.67 0.58  CALCIUM 9.9 8.3*  --  8.6* 8.9   GFR: Estimated Creatinine Clearance: 86.6 mL/min (by C-G formula based on SCr of 0.58 mg/dL). Liver Function Tests: Recent Labs  Lab 11/14/17 2133 11/15/17 0925  AST 21 22  ALT 16 15  ALKPHOS 79 63  BILITOT 1.0 0.8  PROT 8.3* 6.4*  ALBUMIN 4.8 3.5   Recent Labs  Lab 11/14/17 2133  LIPASE 17   No results for input(s): AMMONIA in the last 168 hours. Coagulation Profile: No results for input(s): INR, PROTIME in the last 168 hours. Cardiac Enzymes:  Radiology Studies: Reviewed images personally in health database   Scheduled Meds: . enoxaparin (LOVENOX) injection  40 mg Subcutaneous Q24H  . insulin pump   Subcutaneous Q4H  . metoCLOPramide (REGLAN) injection  10 mg Intravenous Q6H   Continuous Infusions: . famotidine (PEPCID) IV 20 mg (11/16/17 1028)     LOS: 1 day    Time spent: Curtis, MD [facility] ([phone]  If 7PM-7AM, please contact night-coverage [email or web address] Password [email or web address] 11/16/2017, 10:34 AM

## 2017-11-17 ENCOUNTER — Inpatient Hospital Stay (HOSPITAL_COMMUNITY): Payer: Medicaid Other

## 2017-11-17 DIAGNOSIS — J4520 Mild intermittent asthma, uncomplicated: Secondary | ICD-10-CM

## 2017-11-17 DIAGNOSIS — R112 Nausea with vomiting, unspecified: Secondary | ICD-10-CM

## 2017-11-17 DIAGNOSIS — E1065 Type 1 diabetes mellitus with hyperglycemia: Secondary | ICD-10-CM

## 2017-11-17 DIAGNOSIS — R000 Tachycardia, unspecified: Secondary | ICD-10-CM

## 2017-11-17 DIAGNOSIS — K3184 Gastroparesis: Secondary | ICD-10-CM

## 2017-11-17 LAB — GLUCOSE, CAPILLARY
GLUCOSE-CAPILLARY: 96 mg/dL (ref 70–99)
GLUCOSE-CAPILLARY: 57 mg/dL — AB (ref 70–99)
GLUCOSE-CAPILLARY: 109 mg/dL — AB (ref 70–99)
GLUCOSE-CAPILLARY: 67 mg/dL — AB (ref 70–99)
Glucose-Capillary: 135 mg/dL — ABNORMAL HIGH (ref 70–99)
Glucose-Capillary: 138 mg/dL — ABNORMAL HIGH (ref 70–99)
Glucose-Capillary: 174 mg/dL — ABNORMAL HIGH (ref 70–99)
Glucose-Capillary: 68 mg/dL — ABNORMAL LOW (ref 70–99)

## 2017-11-17 LAB — BASIC METABOLIC PANEL
ANION GAP: 14 (ref 5–15)
BUN: 12 mg/dL (ref 6–20)
CHLORIDE: 107 mmol/L (ref 98–111)
CO2: 18 mmol/L — AB (ref 22–32)
Calcium: 9.3 mg/dL (ref 8.9–10.3)
Creatinine, Ser: 0.69 mg/dL (ref 0.44–1.00)
GFR calc non Af Amer: >60 mL/min (ref >60)
Glucose, Bld: 117 mg/dL — ABNORMAL HIGH (ref 70–99)
Potassium: 3.5 mmol/L (ref 3.5–5.1)
Sodium: 139 mmol/L (ref 135–145)

## 2017-11-17 LAB — PREGNANCY, URINE: Preg Test, Ur: NEGATIVE

## 2017-11-17 MED ORDER — DEXTROSE 50 % IV SOLN
INTRAVENOUS | Status: AC
  Administered 2017-11-17: 50 mL
  Filled 2017-11-17: qty 50

## 2017-11-17 MED ORDER — POTASSIUM CHLORIDE IN NACL 40-0.9 MEQ/L-% IV SOLN
INTRAVENOUS | Status: DC
  Administered 2017-11-17 – 2017-11-18 (×3): 100 mL/h via INTRAVENOUS
  Filled 2017-11-17 (×3): qty 1000

## 2017-11-17 MED ORDER — SCOPOLAMINE 1 MG/3DAYS TD PT72
1.0000 | MEDICATED_PATCH | TRANSDERMAL | Status: DC
  Administered 2017-11-17 – 2017-11-20 (×2): 1.5 mg via TRANSDERMAL
  Filled 2017-11-17 (×2): qty 1

## 2017-11-17 MED ORDER — BISACODYL 10 MG RE SUPP
10.0000 mg | Freq: Once | RECTAL | Status: AC
  Administered 2017-11-17: 10 mg via RECTAL
  Filled 2017-11-17: qty 1

## 2017-11-17 MED ORDER — FLEET ENEMA 7-19 GM/118ML RE ENEM: 1.0000 | ENEMA | Freq: Every day | RECTAL | Status: DC | PRN

## 2017-11-17 MED ORDER — SODIUM CHLORIDE 0.9 % IV BOLUS
500.0000 mL | Freq: Once | INTRAVENOUS | Status: AC
  Administered 2017-11-17: 500 mL via INTRAVENOUS

## 2017-11-17 NOTE — Progress Notes (Signed)
Patient's HR has been fluctuating between 120's-140's. MD notified. Will continue to monitor.

## 2017-11-17 NOTE — Progress Notes (Signed)
Hypoglycemic Event  CBG: 57  Treatment: D50 IV 25 mL  Symptoms: None  Follow-up CBG: Time:1749 CBG Result:135  Possible Reasons for Event: Medication   Comments/MD notified:MD spoke to in person     Sherry Rogus L

## 2017-11-17 NOTE — Progress Notes (Signed)
CRITICAL VALUE ALERT  Critical Value: CBG = 67   Date & Time Notied:  009233 @ 2315  Provider Notified:yes  Orders Received/Actions taken:pending

## 2017-11-17 NOTE — Progress Notes (Signed)
PROGRESS NOTE    [name]   GYJ:[id number]  DOB: 1990/03/31  DOA: 11/14/2017 PCP: Vicenta Aly, FNP   Brief Narrative:  [name] is a 27 y.o. female with history of diabetes mellitus type 1 and gastroparesis who was just discharged a week ago after being admitted for intractable nausea vomiting presents with worsening nausea vomiting over the last 2 days with generalized abdominal discomfort which is mostly crampy in nature.    Subjective: Still very nauseated.  ROS: no complaints of nausea, vomiting, constipation diarrhea, cough, dyspnea or dysuria. No other complaints.   Assessment & Plan:   Principal Problem:   Intractable nausea and vomiting- likely gastroparesis - triggered by eating 2 hot dogs this time (on Sunday) -  Phenergan,  IV Reglan to QID, PRN IV Dilaudid - will add scopolamine patch which appeared to help last time   Active Problems:   Uncontrolled type 1 diabetes mellitus  -  cont her insulin pump today- mother to bring in supplies to change it  Constipation - try Dulcolax suppository- PRN Fleet enema also ordered  Urinary retention - has a h/o this when she has intractable vomiting- I and O cath done  Leukocytosis - possibly stress response- follow   Metabolic acidosis/ dehydration - normal anion gap- likely has lactic and starvation acidosis - cont IV hydration   Sinus tachycardia - due to ongoing pain/ distress    Protein-calorie malnutrition, severe  Asthma - PRN Albuterol    DVT prophylaxis: Lovenox Code Status: full code Family Communication:  Disposition Plan: home when stable Consultants:   none Procedures:   none Antimicrobials:  Anti-infectives (From admission, onward)   None       Objective: Vitals:   11/16/17 2217 11/17/17 0401 11/17/17 0551 11/17/17 1418  BP: 117/74 (!) 151/108  (!) 169/107  Pulse: (!) 111 74  83  Resp:  16  18  Temp:  98.7 F (37.1 C)  98.6 F (37 C)  TempSrc:     Oral  SpO2:  96%  (!) 87%  Weight:   58.8 kg   Height:        Intake/Output Summary (Last 24 hours) at 11/17/2017 1435 Last data filed at 11/17/2017 1047 Gross per 24 hour  Intake 2882.61 ml  Output 2150 ml  Net 732.61 ml   Filed Weights   11/14/17 1813 11/17/17 0551  Weight: 58.9 kg 58.8 kg    Examination: General exam: Appears uncomfortable  HEENT: PERRLA, oral mucosa moist, no sclera icterus or thrush Respiratory system: Clear to auscultation. Respiratory effort normal. Cardiovascular system: S1 & S2 heard, RRR.   Gastrointestinal system: Abdomen soft,  diffusly-tender, nondistended. Normal bowel sounds. Central nervous system: Alert and oriented. No focal neurological deficits. Extremities: No cyanosis, clubbing or edema Skin: No rashes or ulcers Psychiatry:  Mood & affect appropriate.     Data Reviewed: I have personally reviewed following labs and imaging studies  CBC: Recent Labs  Lab 11/14/17 2133 11/15/17 0615 11/16/17 0643  WBC 14.1* 20.8* 14.6*  HGB 12.9 11.3* 12.3  HCT 40.8 37.3 38.8  MCV 89.3 93.7 89.8  PLT 536* 428* 263*   Basic Metabolic Panel: Recent Labs  Lab 11/14/17 2133 11/15/17 0303 11/15/17 0615 11/15/17 0925 11/16/17 0643 11/17/17 0952  NA 137 139  --  139 137 139  K 4.2 3.6  --  4.5 3.6 3.5  CL 99 111  --  113* 104 107  CO2 22 16*  --  14* 21* 18*  GLUCOSE 408* 239*  --  158* 174* 117*  BUN 18 18  --  15 10 12   CREATININE 0.84 0.75 0.59 0.67 0.58 0.69  CALCIUM 9.9 8.3*  --  8.6* 8.9 9.3   GFR: Estimated Creatinine Clearance: 86.6 mL/min (by C-G formula based on SCr of 0.69 mg/dL). Liver Function Tests: Recent Labs  Lab 11/14/17 2133 11/15/17 0925  AST 21 22  ALT 16 15  ALKPHOS 79 63  BILITOT 1.0 0.8  PROT 8.3* 6.4*  ALBUMIN 4.8 3.5   Recent Labs  Lab 11/14/17 2133  LIPASE 17   No results for input(s): AMMONIA in the last 168 hours. Coagulation Profile: No results for input(s): INR, PROTIME in the last 168  hours. Cardiac Enzymes: No results for input(s): CKTOTAL, CKMB, CKMBINDEX, TROPONINI in the last 168 hours. BNP (last 3 results) No results for input(s): PROBNP in the last 8760 hours. HbA1C: No results for input(s): HGBA1C in the last 72 hours. CBG: Recent Labs  Lab 11/16/17 2028 11/16/17 2350 11/17/17 0359 11/17/17 0722 11/17/17 1142  GLUCAP 89 258* 174* 138* 96   Lipid Profile: No results for input(s): CHOL, HDL, LDLCALC, TRIG, CHOLHDL, LDLDIRECT in the last 72 hours. Thyroid Function Tests: Recent Labs    11/15/17 1324  TSH 0.358   Anemia Panel: No results for input(s): VITAMINB12, FOLATE, FERRITIN, TIBC, IRON, RETICCTPCT in the last 72 hours. Urine analysis:    Component Value Date/Time   COLORURINE RED (A) 11/14/2017 1815   APPEARANCEUR CLOUDY (A) 11/14/2017 1815   LABSPEC 1.023 11/14/2017 1815   PHURINE 6.0 11/14/2017 1815   GLUCOSEU >=500 (A) 11/14/2017 1815   GLUCOSEU >=1000 11/07/2012 1205   HGBUR LARGE (A) 11/14/2017 1815   BILIRUBINUR NEGATIVE 11/14/2017 1815   KETONESUR 80 (A) 11/14/2017 1815   PROTEINUR 100 (A) 11/14/2017 1815   UROBILINOGEN 0.2 11/24/2014 1045   NITRITE NEGATIVE 11/14/2017 1815   LEUKOCYTESUR TRACE (A) 11/14/2017 1815   Sepsis Labs: @LABRCNTIP (procalcitonin:4,lacticidven:4) )No results found for this or any previous visit (from the past 240 hour(s)).       Radiology Studies: Dg Abd Portable 2v  Result Date: 11/17/2017 CLINICAL DATA:  Intractable nausea, vomiting EXAM: PORTABLE ABDOMEN - 2 VIEW COMPARISON:  12/18/2016 FINDINGS: Prior cholecystectomy. There is normal bowel gas pattern. No free air. No organomegaly or suspicious calcification. No acute bony abnormality. IMPRESSION: No acute findings.  Prior cholecystectomy. Electronically Signed   By: Rolm Baptise M.D.   On: 11/17/2017 11:21      Scheduled Meds: . enoxaparin (LOVENOX) injection  40 mg Subcutaneous Q24H  . insulin pump   Subcutaneous Q4H  . metoCLOPramide  (REGLAN) injection  10 mg Intravenous Q6H  . scopolamine  1 patch Transdermal Q72H   Continuous Infusions: . 0.9 % NaCl with KCl 40 mEq / L 100 mL/hr (11/17/17 1144)  . famotidine (PEPCID) IV 20 mg (11/17/17 1146)  . ondansetron [facility]) IV 8 mg (11/16/17 2057)     LOS: 2 days    Time spent in minutes: 35    Debbe Odea, MD [facility] Pager: [email or web address] Password [email or web address] 11/17/2017, 2:35 PM

## 2017-11-18 ENCOUNTER — Inpatient Hospital Stay: Payer: Self-pay

## 2017-11-18 DIAGNOSIS — E1143 Type 2 diabetes mellitus with diabetic autonomic (poly)neuropathy: Secondary | ICD-10-CM

## 2017-11-18 LAB — GLUCOSE, CAPILLARY
GLUCOSE-CAPILLARY: 238 mg/dL — AB (ref 70–99)
GLUCOSE-CAPILLARY: 92 mg/dL (ref 70–99)
GLUCOSE-CAPILLARY: 56 mg/dL — AB (ref 70–99)
GLUCOSE-CAPILLARY: 67 mg/dL — AB (ref 70–99)
Glucose-Capillary: 316 mg/dL — ABNORMAL HIGH (ref 70–99)
Glucose-Capillary: 119 mg/dL — ABNORMAL HIGH (ref 70–99)

## 2017-11-18 LAB — BASIC METABOLIC PANEL
ANION GAP: 16 — AB (ref 5–15)
ANION GAP: 7 (ref 5–15)
BUN: 8 mg/dL (ref 6–20)
BUN: 9 mg/dL (ref 6–20)
CHLORIDE: 110 mmol/L (ref 98–111)
CO2: 16 mmol/L — AB (ref 22–32)
CO2: 21 mmol/L — AB (ref 22–32)
Calcium: 8.9 mg/dL (ref 8.9–10.3)
Calcium: 8.8 mg/dL — ABNORMAL LOW (ref 8.9–10.3)
Chloride: 103 mmol/L (ref 98–111)
Creatinine, Ser: 0.7 mg/dL (ref 0.44–1.00)
Creatinine, Ser: 0.58 mg/dL (ref 0.44–1.00)
GFR calc Af Amer: >60 mL/min (ref >60)
GFR calc Af Amer: >60 mL/min (ref >60)
GFR calc non Af Amer: >60 mL/min (ref >60)
GFR calc non Af Amer: >60 mL/min (ref >60)
GLUCOSE: 268 mg/dL — AB (ref 70–99)
GLUCOSE: 118 mg/dL — AB (ref 70–99)
POTASSIUM: 3.9 mmol/L (ref 3.5–5.1)
Potassium: 3.8 mmol/L (ref 3.5–5.1)
Sodium: 135 mmol/L (ref 135–145)
Sodium: 138 mmol/L (ref 135–145)

## 2017-11-18 LAB — CBC
HCT: 41.1 % (ref 36.0–46.0)
Hemoglobin: 12.9 g/dL (ref 12.0–15.0)
MCH: 28.1 pg (ref 26.0–34.0)
MCHC: 31.4 g/dL (ref 30.0–36.0)
MCV: 89.5 fL (ref 80.0–100.0)
PLATELETS: 466 10*3/uL — AB (ref 150–400)
RBC: 4.59 MIL/uL (ref 3.87–5.11)
RDW: 13.4 % (ref 11.5–15.5)
WBC: 12.7 10*3/uL — ABNORMAL HIGH (ref 4.0–10.5)
nRBC: 0 % (ref 0.0–0.2)

## 2017-11-18 LAB — BETA-HYDROXYBUTYRIC ACID: BETA-HYDROXYBUTYRIC ACID: 2.94 mmol/L — AB (ref 0.05–0.27)

## 2017-11-18 LAB — LACTIC ACID, PLASMA: Lactic Acid, Venous: 0.8 mmol/L (ref 0.5–1.9)

## 2017-11-18 LAB — KETONES, URINE: Ketones, ur: 80 mg/dL — AB

## 2017-11-18 MED ORDER — GLUCOSE 4 G PO CHEW
CHEWABLE_TABLET | ORAL | Status: AC
  Filled 2017-11-18: qty 1

## 2017-11-18 MED ORDER — HYDROMORPHONE HCL 1 MG/ML IJ SOLN
0.5000 mg | INTRAMUSCULAR | Status: DC | PRN
  Administered 2017-11-18 – 2017-11-19 (×3): 0.5 mg via INTRAMUSCULAR
  Filled 2017-11-18 (×2): qty 0.5

## 2017-11-18 MED ORDER — GLUCOSE 40 % PO GEL
ORAL | Status: AC
  Filled 2017-11-18: qty 1

## 2017-11-18 MED ORDER — GLUCOSE 40 % PO GEL
1.0000 | Freq: Once | ORAL | Status: AC
  Administered 2017-11-18: 37.5 g via ORAL

## 2017-11-18 NOTE — Progress Notes (Signed)
PROGRESS NOTE    Rennae Braun   FTD:[id number]  DOB: [birth year]  DOA: 11/14/2017 PCP: Vicenta Aly, FNP   Brief Narrative:  [name] is a 27 y.o. female with history of diabetes mellitus type 1 and gastroparesis who was just discharged a week ago after being admitted for intractable nausea vomiting presents with worsening nausea vomiting over the last 2 days with generalized abdominal discomfort which is mostly crampy in nature.    Subjective: Feels a little better today in regards to the nausea. No abdominal pain currently.  She had a good BM yesterday.   Assessment & Plan:   Principal Problem:   Intractable nausea and vomiting- likely gastroparesis - triggered by eating 2 hot dogs this time (on Sunday) -  Phenergan,  IV Reglan to QID, PRN IV Dilaudid -  added scopolamine patch yesterday (which appeared to help last time)- she feels like she is improving - try to advance diet today   Active Problems: Metabolic acidosis with anion gap - recheck this today along with lactic acid and ketones    Uncontrolled type 1 diabetes mellitus  - sugars a dropping as low as 57 but she remains asymptomatic -  cont her insulin pump today- mother has brought in supplies    Constipation -  Dulcolax suppository helped  Urinary retention - has a h/o this when she has intractable vomiting- has foley right now  Leukocytosis - possibly stress response- follow   Metabolic acidosis/ dehydration - normal anion gap- likely has lactic and starvation acidosis - cont IV hydration   Sinus tachycardia - due to ongoing pain/ distress    Protein-calorie malnutrition, severe  Asthma - PRN Albuterol    DVT prophylaxis: Lovenox Code Status: full code Family Communication:  Disposition Plan: home when stable Consultants:   none Procedures:   none Antimicrobials:  Anti-infectives (From admission, onward)   None       Objective: Vitals:   11/17/17 0551  11/17/17 1418 11/17/17 2024 11/18/17 0406  BP:  (!) 169/107 124/77 (!) 134/92  Pulse:  83 (!) 105 (!) 108  Resp:  18 16 18   Temp:  98.6 F (37 C) 99.1 F (37.3 C) 98.5 F (36.9 C)  TempSrc:  Oral Oral   SpO2:  (!) 87% 100% 100%  Weight: 58.8 kg     Height:        Intake/Output Summary (Last 24 hours) at 11/18/2017 1310 Last data filed at 11/18/2017 1118 Gross per 24 hour  Intake 2630.62 ml  Output 2325 ml  Net 305.62 ml   Filed Weights   11/14/17 1813 11/17/17 0551  Weight: 58.9 kg 58.8 kg    Examination: General exam: Appears uncomfortable  HEENT: PERRLA, oral mucosa moist, no sclera icterus or thrush Respiratory system: Clear to auscultation. Respiratory effort normal. Cardiovascular system: S1 & S2 heard, RRR.   Gastrointestinal system: Abdomen soft,  diffusly-tender, nondistended. Normal bowel sounds. Central nervous system: Alert and oriented. No focal neurological deficits. Extremities: No cyanosis, clubbing or edema Skin: No rashes or ulcers Psychiatry:  Mood & affect appropriate.     Data Reviewed: I have personally reviewed following labs and imaging studies  CBC: Recent Labs  Lab 11/14/17 2133 11/15/17 0615 11/16/17 0643 11/18/17 0620  WBC 14.1* 20.8* 14.6* 12.7*  HGB 12.9 11.3* 12.3 12.9  HCT 40.8 37.3 38.8 41.1  MCV 89.3 93.7 89.8 89.5  PLT 536* 428* 430* 062*   Basic Metabolic Panel: Recent Labs  Lab 11/15/17 0303 11/15/17 0615 11/15/17  3329 11/16/17 0643 11/17/17 0952 11/18/17 0620  NA 139  --  139 137 139 135  K 3.6  --  4.5 3.6 3.5 3.8  CL 111  --  113* 104 107 103  CO2 16*  --  14* 21* 18* 16*  GLUCOSE 239*  --  158* 174* 117* 268*  BUN 18  --  15 10 12 8   CREATININE 0.75 0.59 0.67 0.58 0.69 0.70  CALCIUM 8.3*  --  8.6* 8.9 9.3 8.9   GFR: Estimated Creatinine Clearance: 86.6 mL/min (by C-G formula based on SCr of 0.7 mg/dL). Liver Function Tests: Recent Labs  Lab 11/14/17 2133 11/15/17 0925  AST 21 22  ALT 16 15    ALKPHOS 79 63  BILITOT 1.0 0.8  PROT 8.3* 6.4*  ALBUMIN 4.8 3.5   Recent Labs  Lab 11/14/17 2133  LIPASE 17   No results for input(s): AMMONIA in the last 168 hours. Coagulation Profile: No results for input(s): INR, PROTIME in the last 168 hours. Cardiac Enzymes: No results for input(s): CKTOTAL, CKMB, CKMBINDEX, TROPONINI in the last 168 hours. BNP (last 3 results) No results for input(s): PROBNP in the last 8760 hours. HbA1C: No results for input(s): HGBA1C in the last 72 hours. CBG: Recent Labs  Lab 11/17/17 2312 11/17/17 2345 11/18/17 0404 11/18/17 0756 11/18/17 1241  GLUCAP 67* 68* 316* 238* 119*   Lipid Profile: No results for input(s): CHOL, HDL, LDLCALC, TRIG, CHOLHDL, LDLDIRECT in the last 72 hours. Thyroid Function Tests: Recent Labs    11/15/17 1324  TSH 0.358   Anemia Panel: No results for input(s): VITAMINB12, FOLATE, FERRITIN, TIBC, IRON, RETICCTPCT in the last 72 hours. Urine analysis:    Component Value Date/Time   COLORURINE RED (A) 11/14/2017 1815   APPEARANCEUR CLOUDY (A) 11/14/2017 1815   LABSPEC 1.023 11/14/2017 1815   PHURINE 6.0 11/14/2017 1815   GLUCOSEU >=500 (A) 11/14/2017 1815   GLUCOSEU >=1000 11/07/2012 1205   HGBUR LARGE (A) 11/14/2017 1815   BILIRUBINUR NEGATIVE 11/14/2017 1815   KETONESUR 80 (A) 11/14/2017 1815   PROTEINUR 100 (A) 11/14/2017 1815   UROBILINOGEN 0.2 11/24/2014 1045   NITRITE NEGATIVE 11/14/2017 1815   LEUKOCYTESUR TRACE (A) 11/14/2017 1815   Sepsis Labs: @LABRCNTIP (procalcitonin:4,lacticidven:4) )No results found for this or any previous visit (from the past 240 hour(s)).       Radiology Studies: Dg Abd Portable 2v  Result Date: 11/17/2017 CLINICAL DATA:  Intractable nausea, vomiting EXAM: PORTABLE ABDOMEN - 2 VIEW COMPARISON:  12/18/2016 FINDINGS: Prior cholecystectomy. There is normal bowel gas pattern. No free air. No organomegaly or suspicious calcification. No acute bony abnormality.  IMPRESSION: No acute findings.  Prior cholecystectomy. Electronically Signed   By: Rolm Baptise M.D.   On: 11/17/2017 11:21      Scheduled Meds: . enoxaparin (LOVENOX) injection  40 mg Subcutaneous Q24H  . insulin pump   Subcutaneous Q4H  . metoCLOPramide (REGLAN) injection  10 mg Intravenous Q6H  . scopolamine  1 patch Transdermal Q72H   Continuous Infusions: . 0.9 % NaCl with KCl 40 mEq / L Stopped (11/18/17 1118)  . famotidine (PEPCID) IV 20 mg (11/18/17 1118)  . ondansetron [facility]) IV 8 mg (11/16/17 2057)     LOS: 3 days    Time spent in minutes: 35    Debbe Odea, MD [facility] Pager: [email or web address] Password [email or web address] 11/18/2017, 1:10 PM

## 2017-11-18 NOTE — Progress Notes (Signed)
Request for IV team to start IV at 1851 and we are still waiting on IV start, pt without pain /nausea meds during this time, phonecall to IV team made and they have many on the waiting list.

## 2017-11-19 DIAGNOSIS — E872 Acidosis: Secondary | ICD-10-CM

## 2017-11-19 LAB — GLUCOSE, CAPILLARY
GLUCOSE-CAPILLARY: 200 mg/dL — AB (ref 70–99)
GLUCOSE-CAPILLARY: 98 mg/dL (ref 70–99)
GLUCOSE-CAPILLARY: 179 mg/dL — AB (ref 70–99)
GLUCOSE-CAPILLARY: 214 mg/dL — AB (ref 70–99)
Glucose-Capillary: 147 mg/dL — ABNORMAL HIGH (ref 70–99)
Glucose-Capillary: 197 mg/dL — ABNORMAL HIGH (ref 70–99)

## 2017-11-19 LAB — CBC
HCT: 41.8 % (ref 36.0–46.0)
HEMOGLOBIN: 13.7 g/dL (ref 12.0–15.0)
MCH: 28.6 pg (ref 26.0–34.0)
MCHC: 32.8 g/dL (ref 30.0–36.0)
MCV: 87.3 fL (ref 80.0–100.0)
Platelets: 452 10*3/uL — ABNORMAL HIGH (ref 150–400)
RBC: 4.79 MIL/uL (ref 3.87–5.11)
RDW: 13.5 % (ref 11.5–15.5)
WBC: 10 10*3/uL (ref 4.0–10.5)
nRBC: 0 % (ref 0.0–0.2)

## 2017-11-19 LAB — BASIC METABOLIC PANEL
Anion gap: 14 (ref 5–15)
BUN: 14 mg/dL (ref 6–20)
CALCIUM: 9.1 mg/dL (ref 8.9–10.3)
CO2: 19 mmol/L — ABNORMAL LOW (ref 22–32)
CREATININE: 0.61 mg/dL (ref 0.44–1.00)
Chloride: 102 mmol/L (ref 98–111)
GFR calc Af Amer: >60 mL/min (ref >60)
GLUCOSE: 185 mg/dL — AB (ref 70–99)
POTASSIUM: 3.5 mmol/L (ref 3.5–5.1)
Sodium: 135 mmol/L (ref 135–145)

## 2017-11-19 MED ORDER — SODIUM CHLORIDE 0.9% FLUSH
10.0000 mL | INTRAVENOUS | Status: DC | PRN
  Administered 2017-11-20: 10 mL
  Filled 2017-11-19: qty 40

## 2017-11-19 MED ORDER — POTASSIUM CHLORIDE 2 MEQ/ML IV SOLN
INTRAVENOUS | Status: DC
  Administered 2017-11-19 – 2017-11-21 (×5): via INTRAVENOUS
  Filled 2017-11-19 (×8): qty 1000

## 2017-11-19 MED ORDER — SODIUM CHLORIDE 0.9 % IV SOLN
8.0000 mg | Freq: Four times a day (QID) | INTRAVENOUS | Status: DC
  Administered 2017-11-19 – 2017-11-21 (×8): 8 mg via INTRAVENOUS
  Filled 2017-11-19 (×11): qty 4

## 2017-11-19 MED ORDER — SODIUM CHLORIDE 0.9 % IV BOLUS
500.0000 mL | Freq: Once | INTRAVENOUS | Status: AC
  Administered 2017-11-19: 10:00:00 via INTRAVENOUS

## 2017-11-19 MED ORDER — ERYTHROMYCIN BASE 250 MG PO TABS
250.0000 mg | ORAL_TABLET | Freq: Four times a day (QID) | ORAL | Status: DC
  Administered 2017-11-19 – 2017-11-21 (×7): 250 mg via ORAL
  Filled 2017-11-19 (×9): qty 1

## 2017-11-19 MED ORDER — HYDROMORPHONE HCL 1 MG/ML IJ SOLN
0.5000 mg | INTRAMUSCULAR | Status: DC | PRN
  Administered 2017-11-19 – 2017-11-20 (×4): 0.5 mg via INTRAVENOUS
  Filled 2017-11-19 (×4): qty 0.5

## 2017-11-19 MED ORDER — KCL IN DEXTROSE-NACL 20-5-0.45 MEQ/L-%-% IV SOLN: INTRAVENOUS | Status: DC

## 2017-11-19 NOTE — Progress Notes (Signed)
Peripherally Inserted Central Catheter/Midline Placement  The IV Nurse has discussed with the patient and/or persons authorized to consent for the patient, the purpose of this procedure and the potential benefits and risks involved with this procedure.  The benefits include less needle sticks, lab draws from the catheter, and the patient may be discharged home with the catheter. Risks include, but not limited to, infection, bleeding, blood clot (thrombus formation), and puncture of an artery; nerve damage and irregular heartbeat and possibility to perform a PICC exchange if needed/ordered by physician.  Alternatives to this procedure were also discussed.  Bard Power PICC patient education guide, fact sheet on infection prevention and patient information card has been provided to patient /or left at bedside.    PICC/Midline Placement Documentation  PICC Double Lumen 11/19/17 PICC Right Brachial 33 cm 0 cm (Active)  Indication for Insertion or Continuance of Line Poor Vasculature-patient has had multiple peripheral attempts or PIVs lasting less than 24 hours 11/19/2017  9:27 AM  Exposed Catheter (cm) 0 cm 11/19/2017  9:27 AM  Site Assessment Clean;Dry;Intact 11/19/2017  9:27 AM  Lumen #1 Status Flushed;Saline locked;Blood return noted 11/19/2017  9:27 AM  Lumen #2 Status Flushed;Saline locked 11/19/2017  9:27 AM  Dressing Type Transparent;Securing device 11/19/2017  9:27 AM  Dressing Status Clean;Dry;Intact;Antimicrobial disc in place 11/19/2017  9:27 AM  Dressing Change Due 11/26/17 11/19/2017  9:27 AM   PICC placed at the bedside with Christella Noa RN    Frances Maywood 11/19/2017, 9:30 AM

## 2017-11-19 NOTE — Progress Notes (Signed)
PROGRESS NOTE    [name]   AES:[id number]  DOB: Apr 16, 1990  DOA: 11/14/2017 PCP: Vicenta Aly, FNP   Brief Narrative:  [name] is a 27 y.o. female with history of diabetes mellitus type 1 and gastroparesis who was just discharged a week ago after being admitted for intractable nausea vomiting presents with worsening nausea vomiting over the last 2 days with generalized abdominal discomfort which is mostly crampy in nature.    Subjective: Still barely tolerating liquids and crackers. Still has abdominal pain and nausea and vomiting.   Assessment & Plan:   Principal Problem:   Intractable nausea and vomiting- likely gastroparesis - triggered by eating 2 hot dogs this time (on Sunday) -  Phenergan,  IV Reglan to QID, PRN IV Dilaudid, Scopolamine patch - not yet improving- add erythromycin    Active Problems: Metabolic acidosis with anion gap - due to ketones- add D5 to fluids    Uncontrolled type 1 diabetes mellitus  - sugars a dropping as low as 57 but she remains asymptomatic -  cont her insulin pump  - mother has brought in supplies    Constipation -  Dulcolax suppository helped  Urinary retention - has a h/o this when she has intractable vomiting- has foley right now  Leukocytosis - possibly stress response-improved     Sinus tachycardia - due to ongoing pain/ distress    Protein-calorie malnutrition, severe  Asthma - PRN Albuterol    DVT prophylaxis: Lovenox Code Status: full code Family Communication:  Disposition Plan: home when stable Consultants:   none Procedures:   none Antimicrobials:  Anti-infectives (From admission, onward)   None       Objective: Vitals:   11/19/17 0156 11/19/17 0427 11/19/17 0800 11/19/17 1352  BP: 129/88 (!) 121/92 120/88 (!) 140/96  Pulse: (!) 115 (!) 121 (!) 115 (!) 112  Resp: 20 20 18    Temp: 98.8 F (37.1 C) 98.8 F (37.1 C)  98.4 F (36.9 C)  TempSrc: Oral Oral  Oral  SpO2:  100% 99%  100%  Weight:      Height:        Intake/Output Summary (Last 24 hours) at 11/19/2017 1613 Last data filed at 11/19/2017 0800 Gross per 24 hour  Intake 413.14 ml  Output 300 ml  Net 113.14 ml   Filed Weights   11/14/17 1813 11/17/17 0551  Weight: 58.9 kg 58.8 kg    Examination: General exam: Appears uncomfortable  HEENT: PERRLA, oral mucosa moist, no sclera icterus or thrush Respiratory system: Clear to auscultation. Respiratory effort normal. Cardiovascular system: S1 & S2 heard, RRR.   Gastrointestinal system: Abdomen soft,  diffusly-tender, nondistended. Normal bowel sounds. Central nervous system: Alert and oriented. No focal neurological deficits. Extremities: No cyanosis, clubbing or edema Skin: No rashes or ulcers Psychiatry:  Mood & affect appropriate.     Data Reviewed: I have personally reviewed following labs and imaging studies  CBC: Recent Labs  Lab 11/14/17 2133 11/15/17 0615 11/16/17 0643 11/18/17 0620 11/19/17 0622  WBC 14.1* 20.8* 14.6* 12.7* 10.0  HGB 12.9 11.3* 12.3 12.9 13.7  HCT 40.8 37.3 38.8 41.1 41.8  MCV 89.3 93.7 89.8 89.5 87.3  PLT 536* 428* 430* 466* 021*   Basic Metabolic Panel: Recent Labs  Lab 11/16/17 0643 11/17/17 0952 11/18/17 0620 11/18/17 1345 11/19/17 0622  NA 137 139 135 138 135  K 3.6 3.5 3.8 3.9 3.5  CL 104 107 103 110 102  CO2 21* 18* 16* 21* 19*  GLUCOSE 174* 117* 268* 118* 185*  BUN 10 12 8 9 14   CREATININE 0.58 0.69 0.70 0.58 0.61  CALCIUM 8.9 9.3 8.9 8.8* 9.1   GFR: Estimated Creatinine Clearance: 86.6 mL/min (by C-G formula based on SCr of 0.61 mg/dL). Liver Function Tests: Recent Labs  Lab 11/14/17 2133 11/15/17 0925  AST 21 22  ALT 16 15  ALKPHOS 79 63  BILITOT 1.0 0.8  PROT 8.3* 6.4*  ALBUMIN 4.8 3.5   Recent Labs  Lab 11/14/17 2133  LIPASE 17   No results for input(s): AMMONIA in the last 168 hours. Coagulation Profile: No results for input(s): INR, PROTIME in the last 168  hours. Cardiac Enzymes: No results for input(s): CKTOTAL, CKMB, CKMBINDEX, TROPONINI in the last 168 hours. BNP (last 3 results) No results for input(s): PROBNP in the last 8760 hours. HbA1C: No results for input(s): HGBA1C in the last 72 hours. CBG: Recent Labs  Lab 11/18/17 2230 11/18/17 2324 11/19/17 0425 11/19/17 0756 11/19/17 1147  GLUCAP 56* 67* 200* 147* 98   Lipid Profile: No results for input(s): CHOL, HDL, LDLCALC, TRIG, CHOLHDL, LDLDIRECT in the last 72 hours. Thyroid Function Tests: No results for input(s): TSH, T4TOTAL, FREET4, T3FREE, THYROIDAB in the last 72 hours. Anemia Panel: No results for input(s): VITAMINB12, FOLATE, FERRITIN, TIBC, IRON, RETICCTPCT in the last 72 hours. Urine analysis:    Component Value Date/Time   COLORURINE RED (A) 11/14/2017 1815   APPEARANCEUR CLOUDY (A) 11/14/2017 1815   LABSPEC 1.023 11/14/2017 1815   PHURINE 6.0 11/14/2017 1815   GLUCOSEU >=500 (A) 11/14/2017 1815   GLUCOSEU >=1000 11/07/2012 1205   HGBUR LARGE (A) 11/14/2017 1815   BILIRUBINUR NEGATIVE 11/14/2017 1815   KETONESUR 80 (A) 11/17/2017 1005   PROTEINUR 100 (A) 11/14/2017 1815   UROBILINOGEN 0.2 11/24/2014 1045   NITRITE NEGATIVE 11/14/2017 1815   LEUKOCYTESUR TRACE (A) 11/14/2017 1815   Sepsis Labs: @LABRCNTIP (procalcitonin:4,lacticidven:4) )No results found for this or any previous visit (from the past 240 hour(s)).       Radiology Studies: [place] Ekg Site Rite  Result Date: 11/18/2017 If [facility] image not attached, placement could not be confirmed due to current cardiac rhythm.     Scheduled Meds: . enoxaparin (LOVENOX) injection  40 mg Subcutaneous Q24H  . insulin pump   Subcutaneous Q4H  . metoCLOPramide (REGLAN) injection  10 mg Intravenous Q6H  . scopolamine  1 patch Transdermal Q72H   Continuous Infusions: . dextrose 5 % lactated ringers with kcl 100 mL/hr at 11/19/17 1218  . famotidine (PEPCID) IV 20 mg (11/19/17 1120)  . ondansetron  (ZOFRAN) IV 8 mg (11/19/17 1155)     LOS: 4 days    Time spent in minutes: 35    Debbe Odea, MD [facility] Pager: [email or web address] Password [email or web address] 11/19/2017, 4:13 PM

## 2017-11-19 NOTE — Progress Notes (Signed)
HR=115. Patient has history of sinus tachycardia. Patient is on telemetry. Alert and oriented x4; no chest pain.

## 2017-11-20 LAB — BASIC METABOLIC PANEL
Anion gap: 6 (ref 5–15)
BUN: 12 mg/dL (ref 6–20)
CALCIUM: 8.5 mg/dL — AB (ref 8.9–10.3)
CHLORIDE: 106 mmol/L (ref 98–111)
CO2: 26 mmol/L (ref 22–32)
CREATININE: 0.55 mg/dL (ref 0.44–1.00)
Glucose, Bld: 208 mg/dL — ABNORMAL HIGH (ref 70–99)
Potassium: 3.6 mmol/L (ref 3.5–5.1)
SODIUM: 138 mmol/L (ref 135–145)

## 2017-11-20 LAB — GLUCOSE, CAPILLARY
GLUCOSE-CAPILLARY: 214 mg/dL — AB (ref 70–99)
GLUCOSE-CAPILLARY: 158 mg/dL — AB (ref 70–99)
Glucose-Capillary: 158 mg/dL — ABNORMAL HIGH (ref 70–99)
Glucose-Capillary: 201 mg/dL — ABNORMAL HIGH (ref 70–99)
Glucose-Capillary: 247 mg/dL — ABNORMAL HIGH (ref 70–99)
Glucose-Capillary: 305 mg/dL — ABNORMAL HIGH (ref 70–99)

## 2017-11-20 MED ORDER — HYDROMORPHONE HCL 1 MG/ML IJ SOLN
0.5000 mg | INTRAMUSCULAR | Status: DC | PRN
  Administered 2017-11-20 – 2017-11-21 (×6): 0.5 mg via INTRAVENOUS
  Filled 2017-11-20 (×6): qty 0.5

## 2017-11-20 NOTE — Progress Notes (Signed)
PROGRESS NOTE    [name]   VOJ:[id number]  DOB: 1991-01-02  DOA: 11/14/2017 PCP: Vicenta Aly, FNP   Brief Narrative:  [name] is a 27 y.o. female with history of diabetes mellitus type 1 and gastroparesis who was just discharged a week ago after being admitted for intractable nausea vomiting presents with worsening nausea vomiting over the last 2 days with generalized abdominal discomfort which is mostly crampy in nature.    Subjective: Vomiting again today. Having persistent abdominal pain.   Assessment & Plan:   Principal Problem:   Intractable nausea and vomiting- likely gastroparesis - s/p cholecystectomy - triggered by eating 2 hot dogs this time (on Sunday) -  Phenergan,  IV Reglan to QID, PRN IV Dilaudid, Scopolamine patch - not yet improving- added erythromycin but not helping - increase Dilaudid frequency today- add K pad   Active Problems: Metabolic acidosis with anion gap - due to ketones- added D5 to fluids- improved    Uncontrolled type 1 diabetes mellitus  - sugars a dropping as low as 57 but she remains asymptomatic -  cont her insulin pump  - mother has brought in supplies    Constipation -  Dulcolax suppository helped  Urinary retention - has a h/o this when she has intractable vomiting- has foley right now  Leukocytosis - possibly stress response-improved    Sinus tachycardia - due to ongoing pain/ distress    Protein-calorie malnutrition, severe  Asthma - PRN Albuterol    DVT prophylaxis: Lovenox Code Status: full code Family Communication:  Disposition Plan: home when stable Consultants:   none Procedures:   none Antimicrobials:  Anti-infectives (From admission, onward)   Start     Dose/Rate Route Frequency Ordered Stop   11/19/17 1800  erythromycin (E-MYCIN) tablet 250 mg     25 0 mg Oral Every 6 hours 11/19/17 1614         Objective: Vitals:   11/19/17 0800 11/19/17 1352 11/19/17 1958 11/20/17  0448  BP: 120/88 (!) 140/96 (!) 150/109 (!) 125/91  Pulse: (!) 115 (!) 112 (!) 125 99  Resp: 18  20 16   Temp:  98.4 F (36.9 C) 98.9 F (37.2 C) 98.5 F (36.9 C)  TempSrc:  Oral Oral Oral  SpO2:  100% 100% 100%  Weight:      Height:        Intake/Output Summary (Last 24 hours) at 11/20/2017 1156 Last data filed at 11/20/2017 1024 Gross per 24 hour  Intake 2528.48 ml  Output 750 ml  Net 1778.48 ml   Filed Weights   11/14/17 1813 11/17/17 0551  Weight: 58.9 kg 58.8 kg    Examination: General exam: Appears uncomfortable  HEENT: PERRLA, oral mucosa moist, no sclera icterus or thrush Respiratory system: Clear to auscultation. Respiratory effort normal. Cardiovascular system: S1 & S2 heard, RRR.   Gastrointestinal system: Abdomen soft,  diffusly-tender, nondistended. Normal bowel sounds. Central nervous system: Alert and oriented. No focal neurological deficits. Extremities: No cyanosis, clubbing or edema Skin: No rashes or ulcers Psychiatry:  Mood & affect appropriate.     Data Reviewed: I have personally reviewed following labs and imaging studies  CBC: Recent Labs  Lab 11/14/17 2133 11/15/17 0615 11/16/17 0643 11/18/17 0620 11/19/17 0622  WBC 14.1* 20.8* 14.6* 12.7* 10.0  HGB 12.9 11.3* 12.3 12.9 13.7  HCT 40.8 37.3 38.8 41.1 41.8  MCV 89.3 93.7 89.8 89.5 87.3  PLT 536* 428* 430* 466* 993*   Basic Metabolic Panel: Recent Labs  Lab  11/17/17 0952 11/18/17 0620 11/18/17 1345 11/19/17 0622 11/20/17 0458  NA 139 135 138 135 138  K 3.5 3.8 3.9 3.5 3.6  CL 107 103 110 102 106  CO2 18* 16* 21* 19* 26  GLUCOSE 117* 268* 118* 185* 208*  BUN 12 8 9 14 12   CREATININE 0.69 0.70 0.58 0.61 0.55  CALCIUM 9.3 8.9 8.8* 9.1 8.5*   GFR: Estimated Creatinine Clearance: 86.6 mL/min (by C-G formula based on SCr of 0.55 mg/dL). Liver Function Tests: Recent Labs  Lab 11/14/17 2133 11/15/17 0925  AST 21 22  ALT 16 15  ALKPHOS 79 63  BILITOT 1.0 0.8  PROT 8.3* 6.4*    ALBUMIN 4.8 3.5   Recent Labs  Lab 11/14/17 2133  LIPASE 17   No results for input(s): AMMONIA in the last 168 hours. Coagulation Profile: No results for input(s): INR, PROTIME in the last 168 hours. Cardiac Enzymes: No results for input(s): CKTOTAL, CKMB, CKMBINDEX, TROPONINI in the last 168 hours. BNP (last 3 results) No results for input(s): PROBNP in the last 8760 hours. HbA1C: No results for input(s): HGBA1C in the last 72 hours. CBG: Recent Labs  Lab 11/19/17 2039 11/19/17 2356 11/20/17 0425 11/20/17 0743 11/20/17 1124  GLUCAP 214* 197* 214* 158* 158*   Lipid Profile: No results for input(s): CHOL, HDL, LDLCALC, TRIG, CHOLHDL, LDLDIRECT in the last 72 hours. Thyroid Function Tests: No results for input(s): TSH, T4TOTAL, FREET4, T3FREE, THYROIDAB in the last 72 hours. Anemia Panel: No results for input(s): VITAMINB12, FOLATE, FERRITIN, TIBC, IRON, RETICCTPCT in the last 72 hours. Urine analysis:    Component Value Date/Time   COLORURINE RED (A) 11/14/2017 1815   APPEARANCEUR CLOUDY (A) 11/14/2017 1815   LABSPEC 1.023 11/14/2017 1815   PHURINE 6.0 11/14/2017 1815   GLUCOSEU >=500 (A) 11/14/2017 1815   GLUCOSEU >=1000 11/07/2012 1205   HGBUR LARGE (A) 11/14/2017 1815   BILIRUBINUR NEGATIVE 11/14/2017 1815   KETONESUR 80 (A) 11/17/2017 1005   PROTEINUR 100 (A) 11/14/2017 1815   UROBILINOGEN 0.2 11/24/2014 1045   NITRITE NEGATIVE 11/14/2017 1815   LEUKOCYTESUR TRACE (A) 11/14/2017 1815   Sepsis Labs: @LABRCNTIP (procalcitonin:4,lacticidven:4) )No results found for this or any previous visit (from the past 240 hour(s)).       Radiology Studies: [place] Ekg Site Rite  Result Date: 11/18/2017 If [facility] image not attached, placement could not be confirmed due to current cardiac rhythm.     Scheduled Meds: . enoxaparin (LOVENOX) injection  40 mg Subcutaneous Q24H  . erythromycin  250 mg Oral Q6H  . insulin pump   Subcutaneous Q4H  . metoCLOPramide  (REGLAN) injection  10 mg Intravenous Q6H  . scopolamine  1 patch Transdermal Q72H   Continuous Infusions: . dextrose 5 % lactated ringers with kcl 100 mL/hr at 11/20/17 1100  . famotidine (PEPCID) IV 20 mg (11/20/17 0920)  . ondansetron (ZOFRAN) IV 8 mg (11/20/17 1103)     LOS: 5 days    Time spent in minutes: 35    Debbe Odea, MD [facility] Pager: [email or web address] Password [email or web address] 11/20/2017, 11:56 AM

## 2017-11-21 LAB — BASIC METABOLIC PANEL
Anion gap: 5 (ref 5–15)
BUN: 6 mg/dL (ref 6–20)
CHLORIDE: 104 mmol/L (ref 98–111)
CO2: 29 mmol/L (ref 22–32)
Calcium: 8.8 mg/dL — ABNORMAL LOW (ref 8.9–10.3)
Creatinine, Ser: 0.68 mg/dL (ref 0.44–1.00)
GFR calc Af Amer: >60 mL/min (ref >60)
GLUCOSE: 111 mg/dL — AB (ref 70–99)
POTASSIUM: 4.1 mmol/L (ref 3.5–5.1)
SODIUM: 138 mmol/L (ref 135–145)

## 2017-11-21 LAB — GLUCOSE, CAPILLARY
GLUCOSE-CAPILLARY: 81 mg/dL (ref 70–99)
Glucose-Capillary: 142 mg/dL — ABNORMAL HIGH (ref 70–99)
Glucose-Capillary: 186 mg/dL — ABNORMAL HIGH (ref 70–99)

## 2017-11-21 MED ORDER — METOCLOPRAMIDE HCL 10 MG PO TABS
10.0000 mg | ORAL_TABLET | Freq: Three times a day (TID) | ORAL | Status: DC
  Administered 2017-11-21: 10 mg via ORAL
  Filled 2017-11-21: qty 1

## 2017-11-21 MED ORDER — SCOPOLAMINE 1 MG/3DAYS TD PT72: 1.0000 | MEDICATED_PATCH | TRANSDERMAL | 12 refills | Status: DC | PRN

## 2017-11-21 NOTE — Progress Notes (Signed)
Pt leaving this afternoon with her mother. Foley catheter has been removed and pt has voided. Discharge instructions given/explained with pt verbalizing understanding.  Pt has had Carb Mod diet and consumed without difficulty and/or pain. Pt aware of followup.

## 2017-11-21 NOTE — Discharge Summary (Signed)
Physician Discharge Summary  [name] OMV:[id number] DOB: 1991-01-09 DOA: 11/14/2017  PCP: Stefanie Aly, FNP  Admit date: 11/14/2017 Discharge date: 11/21/2017  Admitted From: home Disposition:  home    Discharge Condition:  stable CODE STATUS:  Full code   Consultations:  none    Discharge Diagnoses:  Principal Problem:   Nausea & vomiting-  Gastroparesis due to DM ([place]) Active Problems:   Uncontrolled type 1 diabetes mellitus (HCC)   Asthma   Sinus tachycardia   Metabolic acidosis Constipation Urinary retention      Brief Summary: Stefanie Moreheadis a 27 y.o.femalewithhistory of diabetes mellitus type 1 and gastroparesis who was just discharged a week ago after being admitted for intractable nausea vomiting presents with worsening nausea vomiting over the last 2 days with generalized abdominal discomfort which is mostly crampy in nature.    Hospital Course:  Principal Problem: Intractable nausea and vomiting- likely gastroparesis - s/p cholecystectomy in the past - triggered by eating 2 hot dogs this time (on Sunday)- she was doing well prior to that -  Phenergan,  IV Reglan to QID, PRN IV Dilaudid, Scopolamine patch - improved and stable to d/c home- I have again discussed taking small meals and making sure to take Reglan 30 min prior to eating- Scopolamine patch printed and given as she states that her pharmacy did not have it last time   Active Problems: Metabolic acidosis with anion gap - due to ketones- added D5 to fluids- improved  Uncontrolled type 1 diabetes mellitus  - sugars a dropping as low as 57 but she remains asymptomatic - conther insulin pump     Constipation -  Dulcolax suppository helped  Urinary retention - has a h/o this when she has intractable vomiting- foley placed and eventually removed prior to d/c - she voided appropriately  Leukocytosis - possibly stress response-improved    Sinus tachycardia -  due to ongoing pain/ distress- improved    Asthma - PRN Albuterol   Discharge Exam:  Vitals:   11/20/17 0448 11/20/17 1342 11/20/17 2033 11/21/17 0412  BP: (!) 125/91 (!) 147/109 112/87 113/86  Pulse: 99 (!) 126 (!) 105 91  Resp: 16 18 17 18   Temp: 98.5 F (36.9 C) 98.9 F (37.2 C) 98.6 F (37 C) 98.1 F (36.7 C)  TempSrc: Oral Oral Oral Oral  SpO2: 100% 100% 100% 100%  Weight:    59.1 kg  Height:        General: Pt is alert, awake, not in acute distress Cardiovascular: RRR, S1/S2 +, no rubs, no gallops Respiratory: CTA bilaterally, no wheezing, no rhonchi Abdominal: Soft, NT, ND, bowel sounds + Extremities: no edema, no cyanosis   Discharge Instructions  Discharge Instructions    Diet - low sodium heart healthy   Complete by:  As directed    Low fat diet   Diet Carb Modified   Complete by:  As directed    Increase activity slowly   Complete by:  As directed      Allergies as of 11/21/2017      Reactions   Peanut-containing Drug Products Swelling, Other (See Comments)   Reaction:  Swelling of mouth and lips    Food Swelling, Other (See Comments)   Pt is allergic to strawberries.   Reaction:  Swelling of mouth and lips    Ultram [tramadol] Itching      Medication List    STOP taking these medications   cephALEXin 500 MG capsule Commonly known as:  KEFLEX  TAKE these medications   albuterol 108 (90 Base) MCG/ACT inhaler Commonly known as:  PROVENTIL HFA;VENTOLIN HFA Inhale 1-2 puffs into the lungs every 6 (six) hours as needed for wheezing or shortness of breath.   glucagon 1 MG injection Inject 1 mg into the muscle once as needed (For very low blood sugars. May repeat in 15-20 minutes if needed.).   insulin pump Soln Inject 1 each into the skin 3 times daily with meals, bedtime and 2 AM.   metoCLOPramide 10 MG tablet Commonly known as:  REGLAN Take 1 tablet (10 mg total) by mouth 3 (three) times daily with meals.   ondansetron 4 MG  disintegrating tablet Commonly known as:  ZOFRAN-ODT Take 1 tablet (4 mg total) by mouth every 8 (eight) hours as needed for nausea or vomiting.   polyethylene glycol packet Commonly known as:  MIRALAX / GLYCOLAX Take 17 g by mouth daily as needed for mild constipation.   promethazine 50 MG tablet Commonly known as:  PHENERGAN Take 0.5 tablets (25 mg total) by mouth every 6 (six) hours as needed for nausea or vomiting.   scopolamine 1 MG/3DAYS Commonly known as:  TRANSDERM-SCOP Place 1 patch (1.5 mg total) onto the skin every 3 (three) days as needed (during episodes of vomiting).   traMADol 50 MG tablet Commonly known as:  ULTRAM Take 1-2 tablets (50-100 mg total) by mouth every 6 (six) hours as needed. What changed:  reasons to take this      Follow-up Information    [name], [place]. Schedule an appointment as soon as possible for a visit in 1 week(s).   Specialty:  Nurse Practitioner Contact information: [place] [address] [phone]          Allergies  Allergen Reactions  . Peanut-Containing Drug Products Swelling and Other (See Comments)    Reaction:  Swelling of mouth and lips   . 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   Reaction:  Swelling of mouth and lips   . Ultram [Tramadol] Itching     Procedures/Studies:    Dg Abd Portable 2v  Result Date: 11/17/2017 CLINICAL DATA:  Intractable nausea, vomiting EXAM: PORTABLE ABDOMEN - 2 VIEW COMPARISON:  12/18/2016 FINDINGS: Prior cholecystectomy. There is normal bowel gas pattern. No free air. No organomegaly or suspicious calcification. No acute bony abnormality. IMPRESSION: No acute findings.  Prior cholecystectomy. Electronically Signed   By: Rolm Baptise M.D.   On: 11/17/2017 11:21   [place] Ekg Site Rite  Result Date: 11/18/2017 If Site Rite image not attached, placement could not be confirmed due to current cardiac rhythm.  [place] Ekg  Site Rite  Result Date: 11/06/2017 If Site Rite image not attached, placement could not be confirmed due to current cardiac rhythm.     The results of significant diagnostics from this hospitalization (including imaging, microbiology, ancillary and laboratory) are listed below for reference.     Microbiology: No results found for this or any previous visit (from the past 240 hour(s)).   Labs: BNP (last 3 results) No results for input(s): BNP in the last 8760 hours. Basic Metabolic Panel: Recent Labs  Lab 11/18/17 0620 11/18/17 1345 11/19/17 0622 11/20/17 0458 11/21/17 0855  NA 135 138 135 138 138  K 3.8 3.9 3.5 3.6 4.1  CL 103 110 102 106 104  CO2 16* 21* 19* 26 29  GLUCOSE 268* 118* 185* 208* 111*  BUN 8  9 14 12 6   CREATININE 0.70 0.58 0.61 0.55 0.68  CALCIUM 8.9 8.8* 9.1 8.5* 8.8*   Liver Function Tests: Recent Labs  Lab 11/14/17 2133 11/15/17 0925  AST 21 22  ALT 16 15  ALKPHOS 79 63  BILITOT 1.0 0.8  PROT 8.3* 6.4*  ALBUMIN 4.8 3.5   Recent Labs  Lab 11/14/17 2133  LIPASE 17   No results for input(s): AMMONIA in the last 168 hours. CBC: Recent Labs  Lab 11/14/17 2133 11/15/17 0615 11/16/17 0643 11/18/17 0620 11/19/17 0622  WBC 14.1* 20.8* 14.6* 12.7* 10.0  HGB 12.9 11.3* 12.3 12.9 13.7  HCT 40.8 37.3 38.8 41.1 41.8  MCV 89.3 93.7 89.8 89.5 87.3  PLT 536* 428* 430* 466* 452*   Cardiac Enzymes: No results for input(s): CKTOTAL, CKMB, CKMBINDEX, TROPONINI in the last 168 hours. BNP: Invalid input(s): POCBNP CBG: Recent Labs  Lab 11/20/17 1629 11/20/17 2037 11/20/17 2355 11/21/17 0408 11/21/17 0735  GLUCAP 201* 305* 247* 142* 81   D-Dimer No results for input(s): DDIMER in the last 72 hours. Hgb A1c No results for input(s): HGBA1C in the last 72 hours. Lipid Profile No results for input(s): CHOL, HDL, LDLCALC, TRIG, CHOLHDL, LDLDIRECT in the last 72 hours. Thyroid function studies No results for input(s): TSH, T4TOTAL, T3FREE,  THYROIDAB in the last 72 hours.  Invalid input(s): FREET3 Anemia work up No results for input(s): VITAMINB12, FOLATE, FERRITIN, TIBC, IRON, RETICCTPCT in the last 72 hours. Urinalysis    Component Value Date/Time   COLORURINE RED (A) 11/14/2017 1815   APPEARANCEUR CLOUDY (A) 11/14/2017 1815   LABSPEC 1.023 11/14/2017 1815   PHURINE 6.0 11/14/2017 1815   GLUCOSEU >=500 (A) 11/14/2017 1815   GLUCOSEU >=1000 11/07/2012 1205   HGBUR LARGE (A) 11/14/2017 1815   BILIRUBINUR NEGATIVE 11/14/2017 1815   KETONESUR 80 (A) 11/17/2017 1005   PROTEINUR 100 (A) 11/14/2017 1815   UROBILINOGEN 0.2 11/24/2014 1045   NITRITE NEGATIVE 11/14/2017 1815   LEUKOCYTESUR TRACE (A) 11/14/2017 1815   Sepsis Labs Invalid input(s): PROCALCITONIN,  WBC,  LACTICIDVEN Microbiology No results found for this or any previous visit (from the past 240 hour(s)).   Time coordinating discharge in minutes: 35  SIGNED:   Debbe Odea, MD  [facility] 11/21/2017, 10:15 AM Pager   If 7PM-7AM, please contact night-coverage [email or web address] Password [email or web address]

## 2017-11-21 NOTE — Discharge Instructions (Signed)
Eat small non fatty, diabetic meals.   You were cared for by a hospitalist during your hospital stay. If you have any questions about your discharge medications or the care you received while you were in the hospital after you are discharged, you can call the unit and asked to speak with the hospitalist on call if the hospitalist that took care of you is not available. Once you are discharged, your primary care physician will handle any further medical issues.   Please note that NO REFILLS for any discharge medications will be authorized once you are discharged, as it is imperative that you return to your primary care physician (or establish a relationship with a primary care physician if you do not have one) for your aftercare needs so that they can reassess your need for medications and monitor your lab values.  Please take all your medications with you for your next visit with your Primary MD. Please ask your Primary MD to get all Hospital records sent to his/her office. Please request your Primary MD to go over all hospital test results at the follow up.   If you experience worsening of your admission symptoms, develop shortness of breath, chest pain, suicidal or homicidal thoughts or a life threatening emergency, you must seek medical attention immediately by calling 911 or calling your MD.   Dennis Bast must read the complete instructions/literature along with all the possible adverse reactions/side effects for all the medicines you take including new medications that have been prescribed to you. Take new medicines after you have completely understood and accpet all the possible adverse reactions/side effects.    Do not drive when taking pain medications or sedatives.     Do not take more than prescribed Pain, Sleep and Anxiety Medications   If you have smoked or chewed Tobacco in the last 2 yrs please stop. Stop any regular alcohol  and or recreational drug use.   Wear Seat belts while driving.

## 2017-11-29 ENCOUNTER — Inpatient Hospital Stay (HOSPITAL_COMMUNITY)
Admission: EM | Admit: 2017-11-29 | Discharge: 2017-12-07 | DRG: 637 | Disposition: A | Discharge status: Home / Self Care | Payer: Medicaid Other | Attending: Internal Medicine | Admitting: Internal Medicine

## 2017-11-29 ENCOUNTER — Encounter (HOSPITAL_COMMUNITY): Payer: Self-pay | Admitting: Obstetrics and Gynecology

## 2017-11-29 ENCOUNTER — Observation Stay (HOSPITAL_COMMUNITY): Payer: Medicaid Other

## 2017-11-29 ENCOUNTER — Other Ambulatory Visit: Payer: Self-pay

## 2017-11-29 DIAGNOSIS — R000 Tachycardia, unspecified: Secondary | ICD-10-CM

## 2017-11-29 DIAGNOSIS — IMO0002 Reserved for concepts with insufficient information to code with codable children: Secondary | ICD-10-CM | POA: Diagnosis present

## 2017-11-29 DIAGNOSIS — E1043 Type 1 diabetes mellitus with diabetic autonomic (poly)neuropathy: Secondary | ICD-10-CM | POA: Diagnosis present

## 2017-11-29 DIAGNOSIS — E1065 Type 1 diabetes mellitus with hyperglycemia: Secondary | ICD-10-CM | POA: Diagnosis not present

## 2017-11-29 DIAGNOSIS — Z8349 Family history of other endocrine, nutritional and metabolic diseases: Secondary | ICD-10-CM

## 2017-11-29 DIAGNOSIS — K219 Gastro-esophageal reflux disease without esophagitis: Secondary | ICD-10-CM | POA: Diagnosis present

## 2017-11-29 DIAGNOSIS — Z789 Other specified health status: Secondary | ICD-10-CM

## 2017-11-29 DIAGNOSIS — R338 Other retention of urine: Secondary | ICD-10-CM | POA: Diagnosis present

## 2017-11-29 DIAGNOSIS — R1110 Vomiting, unspecified: Secondary | ICD-10-CM

## 2017-11-29 DIAGNOSIS — E876 Hypokalemia: Secondary | ICD-10-CM | POA: Diagnosis not present

## 2017-11-29 DIAGNOSIS — Z9641 Presence of insulin pump (external) (internal): Secondary | ICD-10-CM | POA: Diagnosis present

## 2017-11-29 DIAGNOSIS — K5900 Constipation, unspecified: Secondary | ICD-10-CM | POA: Diagnosis present

## 2017-11-29 DIAGNOSIS — R112 Nausea with vomiting, unspecified: Secondary | ICD-10-CM | POA: Diagnosis not present

## 2017-11-29 DIAGNOSIS — N390 Urinary tract infection, site not specified: Secondary | ICD-10-CM | POA: Diagnosis present

## 2017-11-29 DIAGNOSIS — R339 Retention of urine, unspecified: Secondary | ICD-10-CM | POA: Diagnosis not present

## 2017-11-29 DIAGNOSIS — E1143 Type 2 diabetes mellitus with diabetic autonomic (poly)neuropathy: Secondary | ICD-10-CM | POA: Diagnosis present

## 2017-11-29 DIAGNOSIS — Z833 Family history of diabetes mellitus: Secondary | ICD-10-CM

## 2017-11-29 DIAGNOSIS — E43 Unspecified severe protein-calorie malnutrition: Secondary | ICD-10-CM | POA: Diagnosis present

## 2017-11-29 DIAGNOSIS — R030 Elevated blood-pressure reading, without diagnosis of hypertension: Secondary | ICD-10-CM | POA: Diagnosis present

## 2017-11-29 DIAGNOSIS — E860 Dehydration: Secondary | ICD-10-CM

## 2017-11-29 DIAGNOSIS — E1010 Type 1 diabetes mellitus with ketoacidosis without coma: Principal | ICD-10-CM | POA: Diagnosis present

## 2017-11-29 DIAGNOSIS — E1110 Type 2 diabetes mellitus with ketoacidosis without coma: Secondary | ICD-10-CM | POA: Diagnosis present

## 2017-11-29 DIAGNOSIS — R9431 Abnormal electrocardiogram [ECG] [EKG]: Secondary | ICD-10-CM | POA: Diagnosis present

## 2017-11-29 DIAGNOSIS — Z91018 Allergy to other foods: Secondary | ICD-10-CM

## 2017-11-29 DIAGNOSIS — B957 Other staphylococcus as the cause of diseases classified elsewhere: Secondary | ICD-10-CM | POA: Diagnosis present

## 2017-11-29 DIAGNOSIS — K3184 Gastroparesis: Secondary | ICD-10-CM

## 2017-11-29 DIAGNOSIS — Z8249 Family history of ischemic heart disease and other diseases of the circulatory system: Secondary | ICD-10-CM

## 2017-11-29 DIAGNOSIS — D638 Anemia in other chronic diseases classified elsewhere: Secondary | ICD-10-CM | POA: Diagnosis present

## 2017-11-29 DIAGNOSIS — R52 Pain, unspecified: Secondary | ICD-10-CM

## 2017-11-29 LAB — COMPREHENSIVE METABOLIC PANEL
ALT: 14 U/L (ref 0–44)
ANION GAP: 14 (ref 5–15)
AST: 16 U/L (ref 15–41)
Albumin: 4.4 g/dL (ref 3.5–5.0)
Alkaline Phosphatase: 80 U/L (ref 38–126)
BILIRUBIN TOTAL: 0.7 mg/dL (ref 0.3–1.2)
BUN: 14 mg/dL (ref 6–20)
CALCIUM: 9.4 mg/dL (ref 8.9–10.3)
CO2: 21 mmol/L — ABNORMAL LOW (ref 22–32)
CREATININE: 0.85 mg/dL (ref 0.44–1.00)
Chloride: 103 mmol/L (ref 98–111)
GFR calc non Af Amer: >60 mL/min (ref >60)
Glucose, Bld: 330 mg/dL — ABNORMAL HIGH (ref 70–99)
Potassium: 4.1 mmol/L (ref 3.5–5.1)
Sodium: 138 mmol/L (ref 135–145)
TOTAL PROTEIN: 7.9 g/dL (ref 6.5–8.1)

## 2017-11-29 LAB — BLOOD GAS, VENOUS
Acid-base deficit: 4.5 mmol/L — ABNORMAL HIGH (ref 0.0–2.0)
Bicarbonate: 20.6 mmol/L (ref 20.0–28.0)
FIO2: 21
O2 Saturation: 91.9 %
PATIENT TEMPERATURE: 97.5
PCO2 VEN: 39.1 mmHg — AB (ref 44.0–60.0)
pH, Ven: 7.337 (ref 7.250–7.430)
pO2, Ven: 66.5 mmHg — ABNORMAL HIGH (ref 32.0–45.0)

## 2017-11-29 LAB — CBC WITH DIFFERENTIAL/PLATELET
Abs Immature Granulocytes: 0.05 10*3/uL (ref 0.00–0.07)
Abs Immature Granulocytes: 0.02 10*3/uL (ref 0.00–0.07)
BASOS PCT: 0 %
Basophils Absolute: 0 10*3/uL (ref 0.0–0.1)
Basophils Absolute: 0 10*3/uL (ref 0.0–0.1)
Basophils Relative: 0 %
EOS ABS: 0.2 10*3/uL (ref 0.0–0.5)
EOS PCT: 0 %
Eosinophils Absolute: 0 10*3/uL (ref 0.0–0.5)
Eosinophils Relative: 2 %
HCT: 33.4 % — ABNORMAL LOW (ref 36.0–46.0)
HEMATOCRIT: 41.7 % (ref 36.0–46.0)
HEMOGLOBIN: 13.1 g/dL (ref 12.0–15.0)
Hemoglobin: 10.4 g/dL — ABNORMAL LOW (ref 12.0–15.0)
Immature Granulocytes: 0 %
Immature Granulocytes: 0 %
LYMPHS ABS: 0.7 10*3/uL (ref 0.7–4.0)
LYMPHS PCT: 5 %
Lymphocytes Relative: 11 %
Lymphs Abs: 0.9 10*3/uL (ref 0.7–4.0)
MCH: 28.2 pg (ref 26.0–34.0)
MCH: 28.2 pg (ref 26.0–34.0)
MCHC: 31.4 g/dL (ref 30.0–36.0)
MCHC: 31.1 g/dL (ref 30.0–36.0)
MCV: 89.7 fL (ref 80.0–100.0)
MCV: 90.5 fL (ref 80.0–100.0)
MONO ABS: 0.1 10*3/uL (ref 0.1–1.0)
MONO ABS: 0.7 10*3/uL (ref 0.1–1.0)
MONOS PCT: 1 %
MONOS PCT: 9 %
Neutro Abs: 13.2 10*3/uL — ABNORMAL HIGH (ref 1.7–7.7)
Neutro Abs: 6.1 10*3/uL (ref 1.7–7.7)
Neutrophils Relative %: 94 %
Neutrophils Relative %: 78 %
PLATELETS: 366 10*3/uL (ref 150–400)
Platelets: 456 10*3/uL — ABNORMAL HIGH (ref 150–400)
RBC: 4.65 MIL/uL (ref 3.87–5.11)
RBC: 3.69 MIL/uL — ABNORMAL LOW (ref 3.87–5.11)
RDW: 13.5 % (ref 11.5–15.5)
RDW: 13.8 % (ref 11.5–15.5)
WBC: 14.1 10*3/uL — ABNORMAL HIGH (ref 4.0–10.5)
WBC: 8 10*3/uL (ref 4.0–10.5)
nRBC: 0 % (ref 0.0–0.2)
nRBC: 0 % (ref 0.0–0.2)

## 2017-11-29 LAB — GLUCOSE, CAPILLARY
Glucose-Capillary: 135 mg/dL — ABNORMAL HIGH (ref 70–99)
Glucose-Capillary: 107 mg/dL — ABNORMAL HIGH (ref 70–99)
Glucose-Capillary: 92 mg/dL (ref 70–99)
Glucose-Capillary: 134 mg/dL — ABNORMAL HIGH (ref 70–99)

## 2017-11-29 LAB — HEPATIC FUNCTION PANEL
ALT: 12 U/L (ref 0–44)
AST: 13 U/L — ABNORMAL LOW (ref 15–41)
Albumin: 3.3 g/dL — ABNORMAL LOW (ref 3.5–5.0)
Alkaline Phosphatase: 58 U/L (ref 38–126)
BILIRUBIN DIRECT: 0.1 mg/dL (ref 0.0–0.2)
BILIRUBIN INDIRECT: 0.5 mg/dL (ref 0.3–0.9)
Total Bilirubin: 0.6 mg/dL (ref 0.3–1.2)
Total Protein: 5.9 g/dL — ABNORMAL LOW (ref 6.5–8.1)

## 2017-11-29 LAB — I-STAT BETA HCG BLOOD, ED (MC, WL, AP ONLY)

## 2017-11-29 LAB — I-STAT CHEM 8, ED
BUN: 13 mg/dL (ref 6–20)
Calcium, Ion: 1.17 mmol/L (ref 1.15–1.40)
Chloride: 102 mmol/L (ref 98–111)
Creatinine, Ser: 0.7 mg/dL (ref 0.44–1.00)
GLUCOSE: 324 mg/dL — AB (ref 70–99)
HEMATOCRIT: 41 % (ref 36.0–46.0)
HEMOGLOBIN: 13.9 g/dL (ref 12.0–15.0)
POTASSIUM: 4 mmol/L (ref 3.5–5.1)
Sodium: 137 mmol/L (ref 135–145)
TCO2: 21 mmol/L — AB (ref 22–32)

## 2017-11-29 LAB — BASIC METABOLIC PANEL
ANION GAP: 8 (ref 5–15)
BUN: 13 mg/dL (ref 6–20)
CALCIUM: 8.3 mg/dL — AB (ref 8.9–10.3)
CO2: 21 mmol/L — ABNORMAL LOW (ref 22–32)
Chloride: 111 mmol/L (ref 98–111)
Creatinine, Ser: 0.63 mg/dL (ref 0.44–1.00)
GFR calc Af Amer: >60 mL/min (ref >60)
GLUCOSE: 115 mg/dL — AB (ref 70–99)
Potassium: 3.5 mmol/L (ref 3.5–5.1)
Sodium: 140 mmol/L (ref 135–145)

## 2017-11-29 LAB — CBG MONITORING, ED: GLUCOSE-CAPILLARY: 296 mg/dL — AB (ref 70–99)

## 2017-11-29 LAB — TYPE AND SCREEN
ABO/RH(D): O POS
Antibody Screen: NEGATIVE

## 2017-11-29 LAB — LIPASE, BLOOD: LIPASE: 16 U/L (ref 11–51)

## 2017-11-29 MED ORDER — METOCLOPRAMIDE HCL 5 MG/ML IJ SOLN
5.0000 mg | Freq: Four times a day (QID) | INTRAMUSCULAR | Status: AC
  Administered 2017-11-29 (×2): 5 mg via INTRAVENOUS
  Filled 2017-11-29 (×2): qty 2

## 2017-11-29 MED ORDER — SODIUM CHLORIDE 0.9 % IV BOLUS
1000.0000 mL | Freq: Once | INTRAVENOUS | Status: AC
  Administered 2017-11-29: 1000 mL via INTRAVENOUS

## 2017-11-29 MED ORDER — MAGNESIUM SULFATE 2 GM/50ML IV SOLN
2.0000 g | Freq: Once | INTRAVENOUS | Status: AC
  Administered 2017-11-29: 2 g via INTRAVENOUS
  Filled 2017-11-29: qty 50

## 2017-11-29 MED ORDER — ACETAMINOPHEN 325 MG PO TABS
650.0000 mg | ORAL_TABLET | Freq: Four times a day (QID) | ORAL | Status: DC | PRN
  Administered 2017-11-30 – 2017-12-01 (×4): 650 mg via ORAL
  Filled 2017-11-29 (×4): qty 2

## 2017-11-29 MED ORDER — PANTOPRAZOLE SODIUM 40 MG IV SOLR
40.0000 mg | Freq: Every day | INTRAVENOUS | Status: DC
  Administered 2017-11-29: 40 mg via INTRAVENOUS
  Filled 2017-11-29: qty 40

## 2017-11-29 MED ORDER — SCOPOLAMINE 1 MG/3DAYS TD PT72
1.0000 | MEDICATED_PATCH | TRANSDERMAL | Status: DC | PRN
  Administered 2017-11-30 – 2017-12-03 (×2): 1.5 mg via TRANSDERMAL
  Filled 2017-11-29 (×3): qty 1

## 2017-11-29 MED ORDER — HALOPERIDOL LACTATE 5 MG/ML IJ SOLN
5.0000 mg | Freq: Once | INTRAMUSCULAR | Status: AC
  Administered 2017-11-29: 5 mg via INTRAVENOUS
  Filled 2017-11-29: qty 1

## 2017-11-29 MED ORDER — INSULIN PUMP
SUBCUTANEOUS | Status: DC
  Administered 2017-11-30 (×4): via SUBCUTANEOUS
  Filled 2017-11-29: qty 1

## 2017-11-29 MED ORDER — ONDANSETRON HCL 4 MG PO TABS
4.0000 mg | ORAL_TABLET | Freq: Four times a day (QID) | ORAL | Status: DC | PRN
  Administered 2017-11-29: 4 mg via ORAL
  Filled 2017-11-29: qty 1

## 2017-11-29 MED ORDER — KETOROLAC TROMETHAMINE 15 MG/ML IJ SOLN
15.0000 mg | Freq: Once | INTRAMUSCULAR | Status: AC
  Administered 2017-11-29: 15 mg via INTRAVENOUS
  Filled 2017-11-29: qty 1

## 2017-11-29 MED ORDER — ENOXAPARIN SODIUM 40 MG/0.4ML ~~LOC~~ SOLN: 40.0000 mg | SUBCUTANEOUS | Status: DC

## 2017-11-29 MED ORDER — CLONIDINE HCL 0.1 MG PO TABS
0.1000 mg | ORAL_TABLET | Freq: Two times a day (BID) | ORAL | Status: DC | PRN
  Administered 2017-11-29 – 2017-12-04 (×4): 0.1 mg via ORAL
  Filled 2017-11-29 (×4): qty 1

## 2017-11-29 MED ORDER — DICYCLOMINE HCL 10 MG PO CAPS
10.0000 mg | ORAL_CAPSULE | Freq: Three times a day (TID) | ORAL | Status: DC | PRN
  Administered 2017-11-29 – 2017-12-06 (×3): 10 mg via ORAL
  Filled 2017-11-29 (×6): qty 1

## 2017-11-29 MED ORDER — CAPSAICIN 0.025 % EX CREA
TOPICAL_CREAM | Freq: Two times a day (BID) | CUTANEOUS | Status: DC | PRN
  Administered 2017-11-29: 16:00:00 via TOPICAL
  Filled 2017-11-29: qty 60

## 2017-11-29 MED ORDER — HYDROXYZINE HCL 25 MG PO TABS
25.0000 mg | ORAL_TABLET | Freq: Three times a day (TID) | ORAL | Status: DC | PRN
  Administered 2017-11-29 – 2017-12-01 (×3): 25 mg via ORAL
  Filled 2017-11-29 (×4): qty 1

## 2017-11-29 MED ORDER — METOCLOPRAMIDE HCL 5 MG/ML IJ SOLN: 10.0000 mg | Freq: Once | INTRAMUSCULAR | Status: DC

## 2017-11-29 MED ORDER — HYDROMORPHONE HCL 1 MG/ML IJ SOLN: 0.5000 mg | INTRAMUSCULAR | Status: DC | PRN

## 2017-11-29 MED ORDER — ACETAMINOPHEN 650 MG RE SUPP: 650.0000 mg | Freq: Four times a day (QID) | RECTAL | Status: DC | PRN

## 2017-11-29 MED ORDER — MORPHINE SULFATE (PF) 4 MG/ML IV SOLN
4.0000 mg | Freq: Once | INTRAVENOUS | Status: AC
  Administered 2017-11-29: 4 mg via INTRAVENOUS
  Filled 2017-11-29: qty 1

## 2017-11-29 MED ORDER — PANTOPRAZOLE SODIUM 20 MG PO TBEC
20.0000 mg | DELAYED_RELEASE_TABLET | Freq: Every day | ORAL | Status: DC | PRN
  Filled 2017-11-29: qty 1

## 2017-11-29 MED ORDER — ONDANSETRON HCL 4 MG/2ML IJ SOLN
4.0000 mg | Freq: Four times a day (QID) | INTRAMUSCULAR | Status: DC | PRN
  Administered 2017-11-29 – 2017-12-02 (×4): 4 mg via INTRAVENOUS
  Filled 2017-11-29 (×4): qty 2

## 2017-11-29 MED ORDER — LACTATED RINGERS IV SOLN
INTRAVENOUS | Status: DC
  Administered 2017-11-29 – 2017-11-30 (×3): via INTRAVENOUS

## 2017-11-29 NOTE — H&P (Signed)
History and Physical    [name] WUJ:[id number] DOB: Nov 07, 1990 DOA: 11/29/2017  PCP: Vicenta Aly, FNP  Patient coming from: Home.  Chief Complaint: Nausea vomiting.  HPI: [name] is a 27 y.o. female with history of diabetes mellitus type recently admitted for nausea vomiting multiple times presents to the ER with complaint of pain persistent vomiting last 24 hours.  Also had 3 episodes of diarrhea.  Patient states her vomiting has changed dark-colored.  Complains of crampy abdominal pain.  Denies any fever chills chest pain or shortness of breath.  Denies missing her medications.  ED Course: In the ER patient is tachycardic.  Anion gap is around 14.  Blood sugar is on 300.  Patient UA is pending.  Patient was given 2 L fluid bolus and admitted for intractable nausea vomiting with uncontrolled type II.  On exam patient abdomen appears benign.  LFTs and lipase are normal.  EKG shows sinus tachycardia.  Review of Systems: As per HPI, rest all negative.   Past Medical History:  Diagnosis Date  . Anxiety   . Arthritis    "hands, feet, knees" (12/18/2016)  . Asthma   . Diabetic gastroparesis ([place])    Per gastric emptying study 07/09/16 which showed significant delayed gastric emptying.  . Gallstones   . Gastroparesis   . GERD (gastroesophageal reflux disease)   . Heart murmur   . Hepatic steatosis 11/26/2014   and hepatomegaly  . Hypertension    hx (12/18/2016)  . Intractable cyclical vomiting syndrome    [facility] Endo 12/18/2016  . Liver mass 11/26/2014  . Pancreatitis, acute 11/26/2014  . Pneumonia    "as a teen X 1" (12/18/2016)  . Type I diabetes mellitus ([place]) 2007   IDDM.  poorly controlled, multiple admits with DKA    Past Surgical History:  Procedure Laterality Date  . CHOLECYSTECTOMY N/A 02/11/2015   Procedure: LAPAROSCOPIC CHOLECYSTECTOMY WITH INTRAOPERATIVE CHOLANGIOGRAM;  Surgeon: Greer Pickerel, MD;  Location: [facility] ORS;  Service: General;  Laterality:  N/A;  . ESOPHAGOGASTRODUODENOSCOPY (EGD) WITH PROPOFOL Left 09/20/2014   Procedure: ESOPHAGOGASTRODUODENOSCOPY (EGD) WITH PROPOFOL;  Surgeon: Arta Silence, MD;  Location: [facility] ENDOSCOPY;  Service: Endoscopy;  Laterality: Left;  . WISDOM TOOTH EXTRACTION       reports that she has never smoked. She has never used smokeless tobacco. She reports that she has current or past drug history. Drug: Marijuana. She reports that she does not drink alcohol.  Allergies  Allergen Reactions  . Peanut-Containing Drug Products Swelling and Other (See Comments)    Reaction:  Swelling of mouth and lips   . 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   Reaction:  Swelling of mouth and lips   . Ultram [Tramadol] Itching    Family History  Problem Relation Age of Onset  . Heart disease Maternal Grandmother   . Heart disease Maternal Grandfather   . Diabetes Mother   . Hyperlipidemia Mother   . Hypertension Father   . Heart disease Father   . Hypertension Paternal Grandmother   . Cancer Paternal Grandfather     Prior to Admission medications   Medication Sig Start Date End Date Taking? Authorizing Provider  Insulin Human (INSULIN PUMP) SOLN Inject 1 each into the skin 3 times daily with meals, bedtime and 2 AM. 11/07/17  Yes Rizwan, Eunice Blase, MD  metoCLOPramide (REGLAN) 10 MG tablet Take 1 tablet (10 mg total) by mouth 3 (three) times daily with meals. 11/07/17 11/07/18  Yes Debbe Odea, MD  ondansetron (ZOFRAN ODT) 4 MG disintegrating tablet Take 1 tablet (4 mg total) by mouth every 8 (eight) hours as needed for nausea or vomiting. 11/07/17  Yes Debbe Odea, MD  promethazine (PHENERGAN) 50 MG tablet Take 0.5 tablets (25 mg total) by mouth every 6 (six) hours as needed for nausea or vomiting. 11/07/17  Yes Debbe Odea, MD  scopolamine (TRANSDERM-SCOP) 1 MG/3DAYS Place 1 patch (1.5 mg total) onto the skin every 3 (three) days as needed (during episodes of vomiting). 11/21/17   Yes Debbe Odea, MD  traMADol (ULTRAM) 50 MG tablet Take 1-2 tablets (50-100 mg total) by mouth every 6 (six) hours as needed. Patient taking differently: Take 50-100 mg by mouth every 6 (six) hours as needed for moderate pain or severe pain.  11/07/17  Yes Debbe Odea, MD  albuterol (PROVENTIL HFA;VENTOLIN HFA) 108 (90 Base) MCG/ACT inhaler Inhale 1-2 puffs into the lungs every 6 (six) hours as needed for wheezing or shortness of breath.    [provider]  glucagon (GLUCAGON EMERGENCY) 1 MG injection Inject 1 mg into the muscle once as needed (For very low blood sugars. May repeat in 15-20 minutes if needed.). 12/20/16   Hongalgi, Lenis Dickinson, MD  polyethylene glycol (MIRALAX / GLYCOLAX) packet Take 17 g by mouth daily as needed for mild constipation. Patient not taking: Reported on 11/29/2017 11/07/17   Debbe Odea, MD    Physical Exam: Vitals:   11/29/17 0330 11/29/17 0400 11/29/17 0430 11/29/17 0629  BP: (!) 142/89 128/80 124/85 137/89  Pulse: (!) 138 (!) 135 (!) 142 (!) 131  Resp: 20 17 16 17   Temp:    99 F (37.2 C)  TempSrc:    Oral  SpO2: 100% 99% 99% 100%      Constitutional: Moderately built and nourished. Vitals:   11/29/17 0330 11/29/17 0400 11/29/17 0430 11/29/17 0629  BP: (!) 142/89 128/80 124/85 137/89  Pulse: (!) 138 (!) 135 (!) 142 (!) 131  Resp: 20 17 16 17   Temp:    99 F (37.2 C)  TempSrc:    Oral  SpO2: 100% 99% 99% 100%   Eyes: Anicteric no pallor. ENMT: No discharge from the ears eyes nose or mouth. Neck: No mass felt.  No neck rigidity. Respiratory: Rhonchi or crepitations. Cardiovascular: S1-S2 heard tachycardic. Abdomen: Soft nontender bowel sounds present. Musculoskeletal: No edema. Skin: No rash. Neurologic: Alert awake oriented to time place and person.  Moves all extremities. Psychiatric: Appears normal.   Labs on Admission: I have personally reviewed following labs and imaging studies  CBC: Recent Labs  Lab 11/29/17 0153  11/29/17 0208  WBC 14.1*  --   NEUTROABS 13.2*  --   HGB 13.1 13.9  HCT 41.7 41.0  MCV 89.7  --   PLT 456*  --    Basic Metabolic Panel: Recent Labs  Lab 11/29/17 0153 11/29/17 0208  NA 138 137  K 4.1 4.0  CL 103 102  CO2 21*  --   GLUCOSE 330* 324*  BUN 14 13  CREATININE 0.85 0.70  CALCIUM 9.4  --    GFR: Estimated Creatinine Clearance: 86.6 mL/min (by C-G formula based on SCr of 0.7 mg/dL). Liver Function Tests: Recent Labs  Lab 11/29/17 0153  AST 16  ALT 14  ALKPHOS 80  BILITOT 0.7  PROT 7.9  ALBUMIN 4.4   Recent Labs  Lab 11/29/17 0153  LIPASE 16   No results for input(s): AMMONIA in the last 168  hours. Coagulation Profile: No results for input(s): INR, PROTIME in the last 168 hours. Cardiac Enzymes: No results for input(s): CKTOTAL, CKMB, CKMBINDEX, TROPONINI in the last 168 hours. BNP (last 3 results) No results for input(s): PROBNP in the last 8760 hours. HbA1C: No results for input(s): HGBA1C in the last 72 hours. CBG: Recent Labs  Lab 11/29/17 0200  GLUCAP 296*   Lipid Profile: No results for input(s): CHOL, HDL, LDLCALC, TRIG, CHOLHDL, LDLDIRECT in the last 72 hours. Thyroid Function Tests: No results for input(s): TSH, T4TOTAL, FREET4, T3FREE, THYROIDAB in the last 72 hours. Anemia Panel: No results for input(s): VITAMINB12, FOLATE, FERRITIN, TIBC, IRON, RETICCTPCT in the last 72 hours. Urine analysis:    Component Value Date/Time   COLORURINE RED (A) 11/14/2017 1815   APPEARANCEUR CLOUDY (A) 11/14/2017 1815   LABSPEC 1.023 11/14/2017 1815   PHURINE 6.0 11/14/2017 1815   GLUCOSEU >=500 (A) 11/14/2017 1815   GLUCOSEU >=1000 11/07/2012 1205   HGBUR LARGE (A) 11/14/2017 1815   BILIRUBINUR NEGATIVE 11/14/2017 1815   KETONESUR 80 (A) 11/17/2017 1005   PROTEINUR 100 (A) 11/14/2017 1815   UROBILINOGEN 0.2 11/24/2014 1045   NITRITE NEGATIVE 11/14/2017 1815   LEUKOCYTESUR TRACE (A) 11/14/2017 1815   Sepsis  Labs: @LABRCNTIP (procalcitonin:4,lacticidven:4) )No results found for this or any previous visit (from the past 240 hour(s)).   Radiological Exams on Admission: No results found.  EKG: Independently reviewed.  Sinus tachycardia.  Assessment/Plan Principal Problem:   Nausea & vomiting Active Problems:   Uncontrolled type 1 diabetes mellitus (HCC)   Sinus tachycardia   Protein-calorie malnutrition, severe    1. Intractable nausea vomiting -suspect likely from gastroparesis.  Other differential could be pain relief medication withdrawal or marijuana abuse.  Patient states she has not used marijuana for last 1 month.  Urine drug screen pending.  I have ordered 2 doses of Reglan IV.  Further doses to be considered.  On scopolamine patch and Dilaudid as needed.  IV hydration.  Acute abdominal series are pending.  Since patient is complaining of dark vomitus I have ordered repeat CBC type and screen and place patient on Protonix IV.  Patient had EGD done in September 2016 which showed gastritis.  Gastric emptying study done in June 2018 showed significant delay. 2. Diabetes mellitus type 1 uncontrolled -initially anion gap is around 14.  UA is pending.  I have ordered repeat metabolic panel after fluid bolus patient is on insulin pump.  If the basic metabolic panel shows worsening of anion gap may need to start IV insulin infusion. 3. Sinus tachycardia likely from #1. 4. Leukocytosis likely reactionary.  Patient is afebrile.  UA and x-ray are pending.   DVT prophylaxis: SCDs since patient is complaining of dark vomitus. Code Status: Full code. Family Communication: Discussed with patient. Disposition Plan: Home. Consults called: None. Admission status: Observation.   Rise Patience MD [facility] Pager [phone].  If 7PM-7AM, please contact night-coverage [email or web address] Password [facility]  11/29/2017, 6:36 AM

## 2017-11-29 NOTE — ED Notes (Signed)
Pt is still unable to provide a urine specimen.

## 2017-11-29 NOTE — ED Notes (Signed)
The patient is vomiting up yellow-tinged liquid, retching and dry heaving constantly. Patient denies any alcohol or drug use. Her mother stated that sometimes she has food triggers, patient reports eating chicken yesterday.

## 2017-11-29 NOTE — ED Provider Notes (Addendum)
[facility] DEPT Provider Note   CSN: [id number] Arrival date & time: 11/29/17  0004     History   Chief Complaint Chief Complaint  Patient presents with  . Emesis  . Abdominal Pain    HPI [name] is a 27 y.o. female.  HPI   Yesterday began having emesis, more than 10 times, diarhrea today, approximately 4 times Epigastric abdominal pain, severe Symptoms similar to prior admission for gastroparesis No fever No dysuria   Past Medical History:  Diagnosis Date  . Anxiety   . Arthritis    "hands, feet, knees" (12/18/2016)  . Asthma   . Diabetic gastroparesis ([place])    Per gastric emptying study 07/09/16 which showed significant delayed gastric emptying.  . Gallstones   . Gastroparesis   . GERD (gastroesophageal reflux disease)   . Heart murmur   . Hepatic steatosis 11/26/2014   and hepatomegaly  . Hypertension    hx (12/18/2016)  . Intractable cyclical vomiting syndrome    Archie Endo 12/18/2016  . Liver mass 11/26/2014  . Pancreatitis, acute 11/26/2014  . Pneumonia    "as a teen X 1" (12/18/2016)  . Type I diabetes mellitus ([place]) 2007   IDDM.  poorly controlled, multiple admits with DKA    Patient Active Problem List   Diagnosis Date Noted  . Intractable vomiting with nausea 11/15/2017  . Hypernatremia 11/02/2017  . Nausea and vomiting 01/22/2017  . Nausea & vomiting 01/21/2017  . Intractable cyclical vomiting syndrome 12/18/2016  . Leukocytosis 10/24/2016  . N&V (nausea and vomiting) 10/23/2016  . DKA, type 1 ([place]) 09/26/2016  . Thrombocytosis ([place]) 07/08/2016  . Candiduria, asymptomatic 06/23/2016  . Hyponatremia 06/13/2016  . Hematuria 05/13/2016  . UTI (urinary tract infection) 01/22/2016  . Diarrhea 11/09/2015  . Acute urinary retention   . Gastroparesis due to DM ([place]) 07/10/2015  . GERD (gastroesophageal reflux disease) 07/10/2015  . Depression with anxiety 07/10/2015  . Gastroparesis 06/22/2015  . Altered  mental status 06/22/2015  . Volume depletion 06/10/2015  . Protein-calorie malnutrition, severe 06/10/2015  . Hyperglycemia   . Elevated bilirubin   . Hematemesis with nausea   . Intractable nausea and vomiting 05/28/2015  . Abdominal pain in female   . Abdominal pain 05/24/2015  . Hypertension 05/24/2015  . Dehydration   . Diabetic gastroparesis ([place])   . Chronic diastolic heart failure ([place]) 04/11/2015  . Hematemesis 04/08/2015  . DKA (diabetic ketoacidoses) ([place]) 03/22/2015  . S/P laparoscopic cholecystectomy 02/11/2015  . Postextubation stridor   . Pancreatitis, acute 11/26/2014  . Volume overload 11/26/2014  . Hypokalemia 11/26/2014  . Hepatic steatosis 11/26/2014  . Liver mass 11/26/2014  . Sepsis ([place]) 11/25/2014  . Sinus tachycardia 11/25/2014  . Hypomagnesemia 11/25/2014  . Hypophosphatemia 11/25/2014  . Elevated LFTs 11/24/2014  . AKI (acute Braun injury) ([place]) 11/24/2014  . Migraine headache 11/24/2014  . Asthma 06/29/2012  . Uncontrolled type 1 diabetes mellitus ([place]) 06/19/2010  . Goiter, unspecified 06/19/2010    Past Surgical History:  Procedure Laterality Date  . CHOLECYSTECTOMY N/A 02/11/2015   Procedure: LAPAROSCOPIC CHOLECYSTECTOMY WITH INTRAOPERATIVE CHOLANGIOGRAM;  Surgeon: Greer Pickerel, MD;  Location: [facility];  Service: General;  Laterality: N/A;  . ESOPHAGOGASTRODUODENOSCOPY (EGD) WITH PROPOFOL Left 09/20/2014   Procedure: ESOPHAGOGASTRODUODENOSCOPY (EGD) WITH PROPOFOL;  Surgeon: Arta Silence, MD;  Location: [facility] ENDOSCOPY;  Service: Endoscopy;  Laterality: Left;  . WISDOM TOOTH EXTRACTION       OB History    Gravida  2   Para  1   Term  0   Preterm  1   AB  1   Living  1     SAB  0   TAB  1   Ectopic  0   Multiple  0   Live Births  1            Home Medications    Prior to Admission medications   Medication Sig Start Date End Date Taking? Authorizing Provider  Insulin Human (INSULIN PUMP) SOLN Inject 1 each into the skin  3 times daily with meals, bedtime and 2 AM. 11/07/17  Yes Rizwan, Eunice Blase, MD  metoCLOPramide (REGLAN) 10 MG tablet Take 1 tablet (10 mg total) by mouth 3 (three) times daily with meals. 11/07/17 11/07/18 Yes Debbe Odea, MD  ondansetron (ZOFRAN ODT) 4 MG disintegrating tablet Take 1 tablet (4 mg total) by mouth every 8 (eight) hours as needed for nausea or vomiting. 11/07/17  Yes Debbe Odea, MD  promethazine (PHENERGAN) 50 MG tablet Take 0.5 tablets (25 mg total) by mouth every 6 (six) hours as needed for nausea or vomiting. 11/07/17  Yes Debbe Odea, MD  scopolamine (TRANSDERM-SCOP) 1 MG/3DAYS Place 1 patch (1.5 mg total) onto the skin every 3 (three) days as needed (during episodes of vomiting). 11/21/17  Yes Debbe Odea, MD  traMADol (ULTRAM) 50 MG tablet Take 1-2 tablets (50-100 mg total) by mouth every 6 (six) hours as needed. Patient taking differently: Take 50-100 mg by mouth every 6 (six) hours as needed for moderate pain or severe pain.  11/07/17  Yes Debbe Odea, MD  albuterol (PROVENTIL HFA;VENTOLIN HFA) 108 (90 Base) MCG/ACT inhaler Inhale 1-2 puffs into the lungs every 6 (six) hours as needed for wheezing or shortness of breath.    [provider]  glucagon (GLUCAGON EMERGENCY) 1 MG injection Inject 1 mg into the muscle once as needed (For very low blood sugars. May repeat in 15-20 minutes if needed.). 12/20/16   Hongalgi, Lenis Dickinson, MD  polyethylene glycol (MIRALAX / GLYCOLAX) packet Take 17 g by mouth daily as needed for mild constipation. Patient not taking: Reported on 11/29/2017 11/07/17   Debbe Odea, MD    Family History Family History  Problem Relation Age of Onset  . Heart disease Maternal Grandmother   . Heart disease Maternal Grandfather   . Diabetes Mother   . Hyperlipidemia Mother   . Hypertension Father   . Heart disease Father   . Hypertension Paternal Grandmother   . Cancer Paternal Grandfather     Social History Social History   Tobacco Use    . Smoking status: Never Smoker  . Smokeless tobacco: Never Used  Substance Use Topics  . Alcohol use: No  . Drug use: Yes    Types: Marijuana    Comment: 12/18/2016 q few days"     Allergies   Peanut-containing drug products; Food; and Ultram [tramadol]   Review of Systems Review of Systems  Constitutional: Negative for fever.  HENT: Negative for sore throat.   Eyes: Negative for visual disturbance.  Respiratory: Negative for cough and shortness of breath.   Cardiovascular: Negative for chest pain.  Gastrointestinal: Positive for abdominal pain, diarrhea, nausea and vomiting. Negative for constipation.  Genitourinary: Negative for difficulty urinating.  Musculoskeletal: Negative for back pain and neck pain.  Skin: Negative for rash.  Neurological: Negative for syncope and headaches.     Physical Exam Updated Vital Signs BP 137/89 (BP Location: Right Arm)   Pulse (!) 131  Temp 99 F (37.2 C) (Oral)   Resp 16   SpO2 100%   Physical Exam  Constitutional: She is oriented to person, place, and time. She appears well-developed and well-nourished. No distress.  HENT:  Head: Normocephalic and atraumatic.  Eyes: Conjunctivae and EOM are normal.  Neck: Normal range of motion.  Cardiovascular: Regular rhythm, normal heart sounds and intact distal pulses. Tachycardia present. Exam reveals no gallop and no friction rub.  No murmur heard. Pulmonary/Chest: Effort normal and breath sounds normal. No respiratory distress. She has no wheezes. She has no rales.  Abdominal: Soft. She exhibits no distension. There is tenderness in the epigastric area. There is no guarding.  Musculoskeletal: She exhibits no edema or tenderness.  Neurological: She is alert and oriented to person, place, and time.  Skin: Skin is warm and dry. No rash noted. She is not diaphoretic. No erythema.  Nursing note and vitals reviewed.    ED Treatments / Results  Labs (all labs ordered are listed, but  only abnormal results are displayed) Labs Reviewed  CBC WITH DIFFERENTIAL/PLATELET - Abnormal; Notable for the following components:      Result Value   WBC 14.1 (*)    Platelets 456 (*)    Neutro Abs 13.2 (*)    All other components within normal limits  COMPREHENSIVE METABOLIC PANEL - Abnormal; Notable for the following components:   CO2 21 (*)    Glucose, Bld 330 (*)    All other components within normal limits  BLOOD GAS, VENOUS - Abnormal; Notable for the following components:   pCO2, Ven 39.1 (*)    pO2, Ven 66.5 (*)    Acid-base deficit 4.5 (*)    All other components within normal limits  HEPATIC FUNCTION PANEL - Abnormal; Notable for the following components:   Total Protein 5.9 (*)    Albumin 3.3 (*)    AST 13 (*)    All other components within normal limits  BASIC METABOLIC PANEL - Abnormal; Notable for the following components:   CO2 21 (*)    Glucose, Bld 115 (*)    Calcium 8.3 (*)    All other components within normal limits  CBC WITH DIFFERENTIAL/PLATELET - Abnormal; Notable for the following components:   RBC 3.69 (*)    Hemoglobin 10.4 (*)    HCT 33.4 (*)    All other components within normal limits  GLUCOSE, CAPILLARY - Abnormal; Notable for the following components:   Glucose-Capillary 135 (*)    All other components within normal limits  GLUCOSE, CAPILLARY - Abnormal; Notable for the following components:   Glucose-Capillary 107 (*)    All other components within normal limits  I-STAT CHEM 8, ED - Abnormal; Notable for the following components:   Glucose, Bld 324 (*)    TCO2 21 (*)    All other components within normal limits  CBG MONITORING, ED - Abnormal; Notable for the following components:   Glucose-Capillary 296 (*)    All other components within normal limits  LIPASE, BLOOD  URINALYSIS, ROUTINE W REFLEX MICROSCOPIC  RAPID URINE DRUG SCREEN, HOSP PERFORMED  I-STAT BETA HCG BLOOD, ED ([facility], [facility], [facility] ONLY)  TYPE AND SCREEN    EKG EKG  Interpretation  Date/Time:  Monday November 29 2017 02:22:28 EST Ventricular Rate:  138 PR Interval:    QRS Duration: 83 QT Interval:  396 QTC Calculation: 601 R Axis:   79 Text Interpretation:  Sinus tachycardia Borderline Q waves in lateral leads Prolonged QT  interval Since prior ECG, rate had increased Confirmed by Gareth Morgan [phone]) on 11/29/2017 4:38:06 AM   Radiology Dg Abd Acute W/chest  Result Date: 11/29/2017 CLINICAL DATA:  Persistent lower abdominal pain with nausea and vomiting. EXAM: DG ABDOMEN ACUTE W/ 1V CHEST COMPARISON:  Abdominal CT scan of November 26, 2017 and abdominal radiographs of November 17, 2017 FINDINGS: The lungs are well-expanded and clear. The heart and pulmonary vascularity are normal. The mediastinum is normal in width. The trachea is midline. Within the abdomen there is a relative paucity of bowel gas. The stool burden is not excessive. There is no evidence of a fecal impaction. There are surgical clips in the gallbladder fossa. The bony structures exhibit no acute abnormalities. IMPRESSION: There is no acute cardiopulmonary abnormality. No acute intra-abdominal abnormality is observed either. Electronically Signed   By: David  Martinique M.D.   On: 11/29/2017 08:02    Procedures .Critical Care Performed by: Gareth Morgan, MD Authorized by: Gareth Morgan, MD   Critical care provider statement:    Critical care time (minutes):  30   Critical care was necessary to treat or prevent imminent or life-threatening deterioration of the following conditions:  Dehydration   Critical care was time spent personally by me on the following activities:  Evaluation of patient's response to treatment, examination of patient, ordering and performing treatments and interventions, ordering and review of laboratory studies, pulse oximetry, re-evaluation of patient's condition, obtaining history from patient or surrogate and review of old charts   (including critical  care time)  Medications Ordered in ED Medications  scopolamine (TRANSDERM-SCOP) 1 MG/3DAYS 1.5 mg (has no administration in time range)  acetaminophen (TYLENOL) tablet 650 mg (has no administration in time range)    Or  acetaminophen (TYLENOL) suppository 650 mg (has no administration in time range)  ondansetron (ZOFRAN) tablet 4 mg ( Oral See Alternative 11/29/17 0701)    Or  ondansetron (ZOFRAN) injection 4 mg (4 mg Intravenous Given 11/29/17 0701)  lactated ringers infusion ( Intravenous Rate/Dose Verify 11/29/17 0700)  pantoprazole (PROTONIX) injection 40 mg (40 mg Intravenous Given 11/29/17 0825)  insulin pump ( Subcutaneous Not Given 11/29/17 1149)  sodium chloride 0.9 % bolus 1,000 mL (0 mLs Intravenous Stopped 11/29/17 0333)  sodium chloride 0.9 % bolus 1,000 mL (0 mLs Intravenous Stopped 11/29/17 0448)  ketorolac (TORADOL) 15 MG/ML injection 15 mg (15 mg Intravenous Given 11/29/17 0212)  haloperidol lactate (HALDOL) injection 5 mg (5 mg Intravenous Given 11/29/17 3790)  magnesium sulfate IVPB 2 g 50 mL (0 g Intravenous Stopped 11/29/17 0448)  sodium chloride 0.9 % bolus 1,000 mL (0 mLs Intravenous Stopped 11/29/17 0607)  morphine 4 MG/ML injection 4 mg (4 mg Intravenous Given 11/29/17 0448)  metoCLOPramide (REGLAN) injection 5 mg (5 mg Intravenous Given 11/29/17 1205)     Initial Impression / Assessment and Plan / ED Course  I have reviewed the triage vital signs and the nursing notes.  Pertinent labs & imaging results that were available during my care of the patient were reviewed by me and considered in my medical decision making (see chart for details).     27yo female with history of DM, gastroparesis, recent admissions for conccern of abdominal pain, nausea and vomiting who presents with concern for abdominal pain, nausea and vomiting.  Abdominal exam benign, nonfocal, doubt acute surgical abdomen, no distention, passing flatus and diarrhea and doubt SBO.  Pregnancy test  negative.  UA and UDS pending. No sign of pancreatitis, DKA.   She  is persistently tachycardic despite fluids. EKG shows sinus rhythm. Will admit for continued care of suspected gastroparesis with dehydration.  Final Clinical Impressions(s) / ED Diagnoses   Final diagnoses:  Sinus tachycardia  Gastroparesis  Dehydration    ED Discharge Orders    None       Gareth Morgan, MD 11/29/17 1346    Gareth Morgan, MD 12/21/17 2206

## 2017-11-29 NOTE — ED Triage Notes (Signed)
Per pt's mother: Pt is having an exacerbation of her gastroparesis. Pt has been vomiting since yesterday. Pt reports her blood sugars have been controlled with the last one being 120. Pt reports abdominal pain x3 days.

## 2017-11-29 NOTE — Progress Notes (Signed)
HR= 131. Patient has history of ST. Patient is on telemetry. Alert and oriented x 4. No chest pain complained. EKG was done in ED.

## 2017-11-29 NOTE — Progress Notes (Signed)
HR= 113. Patient has history of ST. Alert and oriented x4. MD was awared. No chest pain complained.

## 2017-11-29 NOTE — Progress Notes (Signed)
PROGRESS NOTE    Zorana Brockwell  OZD:[id number] DOB: [birth year] DOA: 11/29/2017 PCP: Vicenta Aly, FNP      Brief Narrative:  [name] is a 27 y.o. F with IDDM on pump, gastroparesis, known cannabis hyperemesis, and frequent admissions who presents with nausea and vomiting, not possible to control in the ER.     Assessment & Plan:  Nausea vomiting Presumably from gastroparesis (had positive GE study in 2018).  There is also conflicting reports of THC use, raising question of cannabis hyperemesis.  No indication for opiates for abdominal pain. -May trial capsaicin cream to abdomen -Dicyclomine for pain/cramps -Scopolamine patch ordered -Ondansetron, hydroxyzine for nausea   Diabetes -Continue home insulin pump -TIDAC HS accuchecks  Prolonged QTc  Quite long in the ER.   After Haldol, reglan, ondansetron. -Monitor on tele  Sinus tachycardia Agree with admitting, ?withdrawal syndrome, as this appears considerably more than expected from dehydration.  Is this opiate withdrawal (diarrhea, hiccups, tachycardia, malaise, cramps all noted...) -Clonidine PRN for severe tachycardia (if BP allows) -Hydroxyzine PRN  -Bentyl PRN  Anemia of chronic disease Stable relative to baseline       DVT prophylaxis: SCDs Code Status: FULL Family Communication: None present MDM and disposition Plan: This is a no charge note.  For further details, please see H&P by my partner Dr. Hal Hope from earlier today.  The below labs and imaging reports were reviewed and summarized above.    The patient was admitted with vomiting.    Objective: Vitals:   11/29/17 0629 11/29/17 0700 11/29/17 1432 11/29/17 1435  BP: 137/89  127/73   Pulse: (!) 131 (!) 131 (!) 116 (!) 111  Resp:  16 20   Temp: 99 F (37.2 C)  99.6 F (37.6 C)   TempSrc: Oral  Oral   SpO2: 100%  98%     Intake/Output Summary (Last 24 hours) at 11/29/2017 1521 Last data filed at 11/29/2017 1400 Gross per  24 hour  Intake 3867.94 ml  Output 100 ml  Net 3767.94 ml   There were no vitals filed for this visit.  Examination: The patient was seen and examined.      Data Reviewed: I have personally reviewed following labs and imaging studies:  CBC: Recent Labs  Lab 11/29/17 0153 11/29/17 0208 11/29/17 1203  WBC 14.1*  --  8.0  NEUTROABS 13.2*  --  6.1  HGB 13.1 13.9 10.4*  HCT 41.7 41.0 33.4*  MCV 89.7  --  90.5  PLT 456*  --  259   Basic Metabolic Panel: Recent Labs  Lab 11/29/17 0153 11/29/17 0208 11/29/17 1203  NA 138 137 140  K 4.1 4.0 3.5  CL 103 102 111  CO2 21*  --  21*  GLUCOSE 330* 324* 115*  BUN 14 13 13   CREATININE 0.85 0.70 0.63  CALCIUM 9.4  --  8.3*   GFR: Estimated Creatinine Clearance: 86.6 mL/min (by C-G formula based on SCr of 0.63 mg/dL). Liver Function Tests: Recent Labs  Lab 11/29/17 0153 11/29/17 1203  AST 16 13*  ALT 14 12  ALKPHOS 80 58  BILITOT 0.7 0.6  PROT 7.9 5.9*  ALBUMIN 4.4 3.3*   Recent Labs  Lab 11/29/17 0153  LIPASE 16   No results for input(s): AMMONIA in the last 168 hours. Coagulation Profile: No results for input(s): INR, PROTIME in the last 168 hours. Cardiac Enzymes: No results for input(s): CKTOTAL, CKMB, CKMBINDEX, TROPONINI in the last 168 hours. BNP (last 3 results) No  results for input(s): PROBNP in the last 8760 hours. HbA1C: No results for input(s): HGBA1C in the last 72 hours. CBG: Recent Labs  Lab 11/29/17 0200 11/29/17 0743 11/29/17 1143  GLUCAP 296* 135* 107*   Lipid Profile: No results for input(s): CHOL, HDL, LDLCALC, TRIG, CHOLHDL, LDLDIRECT in the last 72 hours. Thyroid Function Tests: No results for input(s): TSH, T4TOTAL, FREET4, T3FREE, THYROIDAB in the last 72 hours. Anemia Panel: No results for input(s): VITAMINB12, FOLATE, FERRITIN, TIBC, IRON, RETICCTPCT in the last 72 hours. Urine analysis:    Component Value Date/Time   COLORURINE RED (A) 11/14/2017 1815   APPEARANCEUR  CLOUDY (A) 11/14/2017 1815   LABSPEC 1.023 11/14/2017 1815   PHURINE 6.0 11/14/2017 1815   GLUCOSEU >=500 (A) 11/14/2017 1815   GLUCOSEU >=1000 11/07/2012 1205   HGBUR LARGE (A) 11/14/2017 1815   BILIRUBINUR NEGATIVE 11/14/2017 1815   KETONESUR 80 (A) 11/17/2017 1005   PROTEINUR 100 (A) 11/14/2017 1815   UROBILINOGEN 0.2 11/24/2014 1045   NITRITE NEGATIVE 11/14/2017 1815   LEUKOCYTESUR TRACE (A) 11/14/2017 1815   Sepsis Labs: @LABRCNTIP (procalcitonin:4,lacticacidven:4)  )No results found for this or any previous visit (from the past 240 hour(s)).       Radiology Studies: Dg Abd Acute W/chest  Result Date: 11/29/2017 CLINICAL DATA:  Persistent lower abdominal pain with nausea and vomiting. EXAM: DG ABDOMEN ACUTE W/ 1V CHEST COMPARISON:  Abdominal CT scan of November 26, 2017 and abdominal radiographs of November 17, 2017 FINDINGS: The lungs are well-expanded and clear. The heart and pulmonary vascularity are normal. The mediastinum is normal in width. The trachea is midline. Within the abdomen there is a relative paucity of bowel gas. The stool burden is not excessive. There is no evidence of a fecal impaction. There are surgical clips in the gallbladder fossa. The bony structures exhibit no acute abnormalities. IMPRESSION: There is no acute cardiopulmonary abnormality. No acute intra-abdominal abnormality is observed either. Electronically Signed   By: David  Martinique M.D.   On: 11/29/2017 08:02        Scheduled Meds: . insulin pump   Subcutaneous Q4H  . pantoprazole (PROTONIX) IV  40 mg Intravenous Daily   Continuous Infusions: . lactated ringers 100 mL/hr at 11/29/17 0700     LOS: 0 days    Time spent: 20 minutes    Edwin Dada, MD [facility] 11/29/2017, 3:21 PM     Pager [phone] --- please page though AMION:  [email or web address] If 7PM-7AM, please contact night-coverage

## 2017-11-29 NOTE — ED Notes (Signed)
Report given to TG, RN.  

## 2017-11-29 NOTE — ED Notes (Signed)
ED TO INPATIENT HANDOFF REPORT  Name/Age/Gender [name] 27 y.o. female  Code Status Code Status History    Date Active Date Inactive Code Status Order ID Comments User Context   11/15/2017 0503 11/21/2017 1747 Full Code [id number]  Rise Patience, MD Inpatient   11/02/2017 1757 11/07/2017 1354 Full Code [id number]  Charlynne Cousins, MD Inpatient   01/21/2017 2304 01/29/2017 1728 Full Code [id number]  Rise Patience, MD ED   12/18/2016 0704 12/20/2016 1727 Full Code [id number]  Rondel Jumbo, PA-C ED   11/05/2016 1525 11/11/2016 1456 Full Code [id number]  Phillips Grout, MD Inpatient   10/23/2016 1342 10/29/2016 2103 Full Code [id number]  Elwin Mocha, MD ED   09/26/2016 1503 09/29/2016 1533 Full Code [id number]  Georgette Shell, MD ED   07/15/2016 1712 07/18/2016 2035 Full Code [id number]  Damita Lack, MD Inpatient   07/05/2016 1658 07/11/2016 1620 Full Code [id number]  Bonnielee Haff, MD Inpatient   06/23/2016 0856 06/28/2016 1346 Full Code [id number]  Janece Canterbury, MD Inpatient   06/12/2016 2137 06/14/2016 1723 Full Code [id number]  Sid Falcon, MD Inpatient   06/03/2016 2044 06/06/2016 2041 Full Code [id number]  Gardiner Barefoot, NP Inpatient   05/31/2016 2348 06/03/2016 2044 Full Code [id number]  Rise Patience, MD Inpatient   05/14/2016 0212 05/16/2016 1718 Full Code [id number]  Toy Baker, MD Inpatient   05/04/2016 2140 05/10/2016 2004 Full Code [id number]  Orson Eva, MD Inpatient   02/29/2016 2201 03/04/2016 1646 Full Code [id number]  Toy Baker, MD Inpatient   02/20/2016 1441 02/25/2016 1723 Full Code [id number]  Elgergawy, Silver Huguenin, MD Inpatient   01/22/2016 1021 01/25/2016 1607 Full Code [id number]  Orson Eva, MD Inpatient   01/17/2016 0634 01/19/2016 1944 Full Code [id number]  Etta Quill, DO ED   11/18/2015 2238 11/23/2015 1231 Full Code [id number]  Bonnielee Haff, MD Inpatient   11/09/2015 2048 11/12/2015 1535 Full Code [id number]  Toy Baker, MD Inpatient   11/09/2015 2048 11/09/2015 2048 Full Code [id number]  Toy Baker, MD Inpatient   11/03/2015 0238 11/06/2015 1301 Full Code [id number]  Jani Gravel, MD Inpatient   11/02/2015 0128 11/03/2015 0238 Full Code [id number]  Phillips Grout, MD Inpatient   07/10/2015 1645 07/13/2015 1431 Full Code [id number]  Ivor Costa, MD ED   07/01/2015 2203 07/06/2015 1522 Full Code [id number]  Gennaro Africa, MD ED   06/22/2015 0624 06/24/2015 1531 Full Code [id number]  Etta Quill, DO Inpatient   06/10/2015 0156 06/11/2015 1519 Full Code [id number]  Reubin Milan, MD Inpatient   05/28/2015 1609 06/03/2015 1932 Full Code [id number]  Barton Dubois, MD Inpatient   05/24/2015 2359 05/25/2015 1858 Full Code [id number]  Theressa Millard, MD Inpatient   04/21/2015 0931 04/23/2015 1445 Full Code [id number]  Gennaro Africa, MD ED   04/08/2015 2229 04/15/2015 1605 Full Code [id number]  Reubin Milan, MD ED   03/22/2015 0243 03/25/2015 1811 Full Code [id number]  Rise Patience, MD ED   02/11/2015 1530 02/12/2015 1805 Full Code [id number]  Greer Pickerel, MD Inpatient   11/24/2014 1309 11/29/2014 1929 Full Code [id number]  Rama, Venetia Maxon, MD Inpatient   09/16/2014 0530 09/21/2014 2007 Full Code [id number]  Etta Quill, DO ED   06/29/2012 1400 07/01/2012 1517 Full Code [id number]  Velvet Bathe, MD Inpatient   12/08/2011 1543 12/12/2011 1450 Full Code [id number]  Chyrel Masson, RN Inpatient      Home/SNF/Other Home  Chief Complaint Emesis; Abdominal Pain  Level of Care/Admitting Diagnosis ED Disposition    ED Disposition Condition Comment   Admit  Hospital Area: [facility] [[id number]]  Level of Care: Telemetry [5]  Admit to tele based on following criteria: Monitor for Ischemic changes  Diagnosis: Nausea & vomiting [784784]  Admitting Physician: Rise Patience [phone]  Attending Physician: Rise Patience [phone]  PT Class (Do Not Modify): Observation [104]  PT Acc Code  (Do Not Modify): Observation [10022]       Medical History Past Medical History:  Diagnosis Date  . Anxiety   . Arthritis    "hands, feet, knees" (12/18/2016)  . Asthma   . Diabetic gastroparesis ([place])    Per gastric emptying study 07/09/16 which showed significant delayed gastric emptying.  . Gallstones   . Gastroparesis   . GERD (gastroesophageal reflux disease)   . Heart murmur   . Hepatic steatosis 11/26/2014   and hepatomegaly  . Hypertension    hx (12/18/2016)  . Intractable cyclical vomiting syndrome    Archie Endo 12/18/2016  . Liver mass 11/26/2014  . Pancreatitis, acute 11/26/2014  . Pneumonia    "as a teen X 1" (12/18/2016)  . Type I diabetes mellitus ([place]) 2007   IDDM.  poorly controlled, multiple admits with DKA    Allergies Allergies  Allergen Reactions  . Peanut-Containing Drug Products Swelling and Other (See Comments)    Reaction:  Swelling of mouth and lips   . 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   Reaction:  Swelling of mouth and lips   . Ultram [Tramadol] Itching    IV Location/Drains/Wounds Patient Lines/Drains/Airways Status   Active Line/Drains/Airways    Name:   Placement date:   Placement time:   Site:   Days:   Peripheral IV 11/29/17 Left;Upper Arm   11/29/17    0204    Arm   less than 1          Labs/Imaging Results for orders placed or performed during the hospital encounter of 11/29/17 (from the past 48 hour(s))  CBC with Differential     Status: Abnormal   Collection Time: 11/29/17  1:53 AM  Result Value Ref Range   WBC 14.1 (H) 4.0 - 10.5 K/uL   RBC 4.65 3.87 - 5.11 MIL/uL   Hemoglobin 13.1 12.0 - 15.0 g/dL   HCT 41.7 36.0 - 46.0 %   MCV 89.7 80.0 - 100.0 fL   MCH 28.2 26.0 - 34.0 pg   MCHC 31.4 30.0 - 36.0 g/dL   RDW 13.5 11.5 - 15.5 %   Platelets 456 (H) 150 - 400 K/uL   nRBC 0.0 0.0 - 0.2 %   Neutrophils Relative % 94 %   Neutro Abs 13.2 (H) 1.7 - 7.7 K/uL   Lymphocytes Relative 5 %    Lymphs Abs 0.7 0.7 - 4.0 K/uL   Monocytes Relative 1 %   Monocytes Absolute 0.1 0.1 - 1.0 K/uL   Eosinophils Relative 0 %   Eosinophils Absolute 0.0 0.0 - 0.5 K/uL   Basophils Relative 0 %   Basophils Absolute 0.0 0.0 - 0.1 K/uL   Immature Granulocytes 0 %   Abs Immature Granulocytes 0.05 0.00 - 0.07 K/uL    Comment: Performed at [facility], [place] [address], [place], [place] [address]  Comprehensive metabolic panel     Status: Abnormal   Collection Time: 11/29/17  1:53 AM  Result Value Ref Range  Sodium 138 135 - 145 mmol/L   Potassium 4.1 3.5 - 5.1 mmol/L   Chloride 103 98 - 111 mmol/L   CO2 21 (L) 22 - 32 mmol/L   Glucose, Bld 330 (H) 70 - 99 mg/dL   BUN 14 6 - 20 mg/dL   Creatinine, Ser 0.85 0.44 - 1.00 mg/dL   Calcium 9.4 8.9 - 10.3 mg/dL   Total Protein 7.9 6.5 - 8.1 g/dL   Albumin 4.4 3.5 - 5.0 g/dL   AST 16 15 - 41 U/L   ALT 14 0 - 44 U/L   Alkaline Phosphatase 80 38 - 126 U/L   Total Bilirubin 0.7 0.3 - 1.2 mg/dL   GFR calc non Af Amer >60 >60 mL/min   GFR calc Af Amer >60 >60 mL/min    Comment: (NOTE) The eGFR has been calculated using the CKD EPI equation. This calculation has not been validated in all clinical situations. eGFR's persistently <60 mL/min signify possible Chronic Kidney Disease.    Anion gap 14 5 - 15    Comment: Performed at [facility], [place] [address], [place], [place] [address]  Lipase, blood     Status: None   Collection Time: 11/29/17  1:53 AM  Result Value Ref Range   Lipase 16 11 - 51 U/L    Comment: Performed at [facility], [place] [address] [place],  [address]  CBG monitoring, ED     Status: Abnormal   Collection Time: 11/29/17  2:00 AM  Result Value Ref Range   Glucose-Capillary 296 (H) 70 - 99 mg/dL  I-Stat Beta hCG blood, ED ([facility], [facility], [facility] only)     Status: None   Collection Time: 11/29/17  2:07 AM  Result Value Ref Range   I-stat hCG, quantitative <5.0 <5 mIU/mL    Comment 3            Comment:   GEST. AGE      CONC.  (mIU/mL)   <=1 WEEK        5 - 50     2 WEEKS       50 - 500     3 WEEKS       100 - 10,000     4 WEEKS     1,000 - 30,000        FEMALE AND NON-PREGNANT FEMALE:     LESS THAN 5 mIU/mL   I-Stat Chem 8, ED     Status: Abnormal   Collection Time: 11/29/17  2:08 AM  Result Value Ref Range   Sodium 137 135 - 145 mmol/L   Potassium 4.0 3.5 - 5.1 mmol/L   Chloride 102 98 - 111 mmol/L   BUN 13 6 - 20 mg/dL   Creatinine, Ser 0.70 0.44 - 1.00 mg/dL   Glucose, Bld 324 (H) 70 - 99 mg/dL   Calcium, Ion 1.17 1.15 - 1.40 mmol/L   TCO2 21 (L) 22 - 32 mmol/L   Hemoglobin 13.9 12.0 - 15.0 g/dL   HCT 41.0 36.0 - 46.0 %  Blood gas, venous     Status: Abnormal   Collection Time: 11/29/17  2:37 AM  Result Value Ref Range   FIO2 21.00    pH, Ven 7.337 7.250 - 7.430   pCO2, Ven 39.1 (L) 44.0 - 60.0 mmHg   pO2, Ven 66.5 (H) 32.0 - 45.0 mmHg   Bicarbonate 20.6 20.0 - 28.0 mmol/L   Acid-base deficit 4.5 (H) 0.0 - 2.0  mmol/L   O2 Saturation 91.9 %   Patient temperature 97.5    Collection site VEIN    Drawn by DRAWN BY RN    Sample type VEIN     Comment: Performed at [facility] [address] [place], [place] [address]   No results found.  Pending Labs Unresulted Labs (From admission, onward)    Start     Ordered   11/29/17 0133  Urinalysis, Routine w reflex microscopic  Once,   R     11/29/17 0132          Vitals/Pain Today's Vitals   11/29/17 0400 11/29/17 0430 11/29/17 0448 11/29/17 0536  BP: 128/80 124/85    Pulse: (!) 135 (!) 142    Resp: 17 16    Temp:      TempSrc:      SpO2: 99% 99%    PainSc:   10-Worst pain ever 7     Isolation Precautions No active isolations  Medications Medications  sodium chloride 0.9 % bolus 1,000 mL (0 mLs Intravenous Stopped 11/29/17 0333)  sodium chloride 0.9 % bolus 1,000 mL (0 mLs Intravenous Stopped 11/29/17 0448)  ketorolac (TORADOL) 15 MG/ML injection 15 mg (15  mg Intravenous Given 11/29/17 0212)  haloperidol lactate (HALDOL) injection 5 mg (5 mg Intravenous Given 11/29/17 0212)  magnesium sulfate IVPB 2 g 50 mL (0 g Intravenous Stopped 11/29/17 0448)  sodium chloride 0.9 % bolus 1,000 mL (1,000 mLs Intravenous New Bag/Given 11/29/17 0450)  morphine 4 MG/ML injection 4 mg (4 mg Intravenous Given 11/29/17 0448)    Mobility walks

## 2017-11-30 DIAGNOSIS — B957 Other staphylococcus as the cause of diseases classified elsewhere: Secondary | ICD-10-CM | POA: Diagnosis present

## 2017-11-30 DIAGNOSIS — E1010 Type 1 diabetes mellitus with ketoacidosis without coma: Secondary | ICD-10-CM | POA: Diagnosis present

## 2017-11-30 DIAGNOSIS — D638 Anemia in other chronic diseases classified elsewhere: Secondary | ICD-10-CM | POA: Diagnosis present

## 2017-11-30 DIAGNOSIS — E43 Unspecified severe protein-calorie malnutrition: Secondary | ICD-10-CM | POA: Diagnosis present

## 2017-11-30 DIAGNOSIS — E876 Hypokalemia: Secondary | ICD-10-CM | POA: Diagnosis not present

## 2017-11-30 DIAGNOSIS — K3184 Gastroparesis: Secondary | ICD-10-CM | POA: Diagnosis present

## 2017-11-30 DIAGNOSIS — E1043 Type 1 diabetes mellitus with diabetic autonomic (poly)neuropathy: Secondary | ICD-10-CM | POA: Diagnosis present

## 2017-11-30 DIAGNOSIS — R112 Nausea with vomiting, unspecified: Secondary | ICD-10-CM | POA: Diagnosis not present

## 2017-11-30 DIAGNOSIS — Z9641 Presence of insulin pump (external) (internal): Secondary | ICD-10-CM | POA: Diagnosis present

## 2017-11-30 DIAGNOSIS — N390 Urinary tract infection, site not specified: Secondary | ICD-10-CM | POA: Diagnosis present

## 2017-11-30 DIAGNOSIS — Z8249 Family history of ischemic heart disease and other diseases of the circulatory system: Secondary | ICD-10-CM | POA: Diagnosis not present

## 2017-11-30 DIAGNOSIS — K219 Gastro-esophageal reflux disease without esophagitis: Secondary | ICD-10-CM | POA: Diagnosis present

## 2017-11-30 DIAGNOSIS — Z8349 Family history of other endocrine, nutritional and metabolic diseases: Secondary | ICD-10-CM | POA: Diagnosis not present

## 2017-11-30 DIAGNOSIS — K5900 Constipation, unspecified: Secondary | ICD-10-CM | POA: Diagnosis present

## 2017-11-30 DIAGNOSIS — R000 Tachycardia, unspecified: Secondary | ICD-10-CM | POA: Diagnosis present

## 2017-11-30 DIAGNOSIS — E1065 Type 1 diabetes mellitus with hyperglycemia: Secondary | ICD-10-CM | POA: Diagnosis not present

## 2017-11-30 DIAGNOSIS — R338 Other retention of urine: Secondary | ICD-10-CM | POA: Diagnosis not present

## 2017-11-30 DIAGNOSIS — R9431 Abnormal electrocardiogram [ECG] [EKG]: Secondary | ICD-10-CM | POA: Diagnosis present

## 2017-11-30 DIAGNOSIS — R339 Retention of urine, unspecified: Secondary | ICD-10-CM | POA: Diagnosis not present

## 2017-11-30 DIAGNOSIS — R030 Elevated blood-pressure reading, without diagnosis of hypertension: Secondary | ICD-10-CM | POA: Diagnosis present

## 2017-11-30 DIAGNOSIS — E860 Dehydration: Secondary | ICD-10-CM | POA: Diagnosis not present

## 2017-11-30 DIAGNOSIS — E1143 Type 2 diabetes mellitus with diabetic autonomic (poly)neuropathy: Secondary | ICD-10-CM | POA: Diagnosis not present

## 2017-11-30 DIAGNOSIS — Z833 Family history of diabetes mellitus: Secondary | ICD-10-CM | POA: Diagnosis not present

## 2017-11-30 DIAGNOSIS — Z91018 Allergy to other foods: Secondary | ICD-10-CM | POA: Diagnosis not present

## 2017-11-30 LAB — GLUCOSE, CAPILLARY
GLUCOSE-CAPILLARY: 369 mg/dL — AB (ref 70–99)
GLUCOSE-CAPILLARY: 456 mg/dL — AB (ref 70–99)
GLUCOSE-CAPILLARY: 285 mg/dL — AB (ref 70–99)
GLUCOSE-CAPILLARY: 224 mg/dL — AB (ref 70–99)
GLUCOSE-CAPILLARY: 185 mg/dL — AB (ref 70–99)
Glucose-Capillary: 279 mg/dL — ABNORMAL HIGH (ref 70–99)
Glucose-Capillary: 364 mg/dL — ABNORMAL HIGH (ref 70–99)
Glucose-Capillary: 385 mg/dL — ABNORMAL HIGH (ref 70–99)
Glucose-Capillary: 416 mg/dL — ABNORMAL HIGH (ref 70–99)
Glucose-Capillary: 422 mg/dL — ABNORMAL HIGH (ref 70–99)

## 2017-11-30 LAB — BASIC METABOLIC PANEL
Anion gap: 12 (ref 5–15)
Anion gap: 24 — ABNORMAL HIGH (ref 5–15)
Anion gap: 18 — ABNORMAL HIGH (ref 5–15)
BUN: 11 mg/dL (ref 6–20)
BUN: 19 mg/dL (ref 6–20)
BUN: 18 mg/dL (ref 6–20)
CO2: 18 mmol/L — ABNORMAL LOW (ref 22–32)
CO2: 8 mmol/L — ABNORMAL LOW (ref 22–32)
CO2: 9 mmol/L — ABNORMAL LOW (ref 22–32)
CREATININE: 1.15 mg/dL — AB (ref 0.44–1.00)
Calcium: 8.3 mg/dL — ABNORMAL LOW (ref 8.9–10.3)
Calcium: 9.1 mg/dL (ref 8.9–10.3)
Calcium: 8.5 mg/dL — ABNORMAL LOW (ref 8.9–10.3)
Chloride: 105 mmol/L (ref 98–111)
Chloride: 102 mmol/L (ref 98–111)
Chloride: 109 mmol/L (ref 98–111)
Creatinine, Ser: 0.84 mg/dL (ref 0.44–1.00)
Creatinine, Ser: 1.11 mg/dL — ABNORMAL HIGH (ref 0.44–1.00)
GFR calc Af Amer: >60 mL/min (ref >60)
GFR calc Af Amer: >60 mL/min (ref >60)
GFR calc Af Amer: >60 mL/min (ref >60)
GFR calc non Af Amer: >60 mL/min (ref >60)
GLUCOSE: 442 mg/dL — AB (ref 70–99)
GLUCOSE: 289 mg/dL — AB (ref 70–99)
Glucose, Bld: 440 mg/dL — ABNORMAL HIGH (ref 70–99)
POTASSIUM: 4.8 mmol/L (ref 3.5–5.1)
Potassium: 5.2 mmol/L — ABNORMAL HIGH (ref 3.5–5.1)
Potassium: 4.5 mmol/L (ref 3.5–5.1)
SODIUM: 136 mmol/L (ref 135–145)
Sodium: 135 mmol/L (ref 135–145)
Sodium: 134 mmol/L — ABNORMAL LOW (ref 135–145)

## 2017-11-30 LAB — CBC
HCT: 34.4 % — ABNORMAL LOW (ref 36.0–46.0)
Hemoglobin: 11 g/dL — ABNORMAL LOW (ref 12.0–15.0)
MCH: 28.6 pg (ref 26.0–34.0)
MCHC: 32 g/dL (ref 30.0–36.0)
MCV: 89.6 fL (ref 80.0–100.0)
PLATELETS: UNDETERMINED 10*3/uL (ref 150–400)
RBC: 3.84 MIL/uL — AB (ref 3.87–5.11)
RDW: 14 % (ref 11.5–15.5)
WBC: 7.9 10*3/uL (ref 4.0–10.5)
nRBC: 0 % (ref 0.0–0.2)

## 2017-11-30 LAB — URINALYSIS, ROUTINE W REFLEX MICROSCOPIC
Bilirubin Urine: NEGATIVE
Glucose, UA: >=500 mg/dL — AB
Ketones, ur: 80 mg/dL — AB
Nitrite: NEGATIVE
Protein, ur: NEGATIVE mg/dL
SPECIFIC GRAVITY, URINE: 1.014 (ref 1.005–1.030)
pH: 5 (ref 5.0–8.0)

## 2017-11-30 LAB — MRSA PCR SCREENING: MRSA BY PCR: NEGATIVE

## 2017-11-30 MED ORDER — PROMETHAZINE HCL 25 MG/ML IJ SOLN
12.5000 mg | Freq: Four times a day (QID) | INTRAMUSCULAR | Status: DC | PRN
  Administered 2017-11-30 – 2017-12-06 (×16): 12.5 mg via INTRAVENOUS
  Filled 2017-11-30 (×16): qty 1

## 2017-11-30 MED ORDER — SODIUM CHLORIDE 0.9 % IV SOLN
INTRAVENOUS | Status: DC
  Administered 2017-12-03: via INTRAVENOUS

## 2017-11-30 MED ORDER — SODIUM CHLORIDE 0.9 % IV SOLN
INTRAVENOUS | Status: AC
  Administered 2017-11-30: 18:00:00 via INTRAVENOUS

## 2017-11-30 MED ORDER — SODIUM CHLORIDE 0.9 % IV SOLN
250.0000 mg | Freq: Three times a day (TID) | INTRAVENOUS | Status: DC
  Administered 2017-11-30 – 2017-12-02 (×6): 250 mg via INTRAVENOUS
  Filled 2017-11-30 (×9): qty 5

## 2017-11-30 MED ORDER — INSULIN ASPART 100 UNIT/ML ~~LOC~~ SOLN: 0.0000 [IU] | Freq: Every day | SUBCUTANEOUS | Status: DC

## 2017-11-30 MED ORDER — INSULIN ASPART 100 UNIT/ML ~~LOC~~ SOLN
6.0000 [IU] | Freq: Three times a day (TID) | SUBCUTANEOUS | Status: DC
  Administered 2017-11-30: 6 [IU] via SUBCUTANEOUS

## 2017-11-30 MED ORDER — PROMETHAZINE HCL 25 MG PO TABS
25.0000 mg | ORAL_TABLET | Freq: Four times a day (QID) | ORAL | Status: DC | PRN
  Administered 2017-12-06: 25 mg via ORAL
  Filled 2017-11-30 (×2): qty 1

## 2017-11-30 MED ORDER — INSULIN GLARGINE 100 UNIT/ML ~~LOC~~ SOLN
30.0000 [IU] | Freq: Every day | SUBCUTANEOUS | Status: DC
  Filled 2017-11-30 (×2): qty 0.3

## 2017-11-30 MED ORDER — PROMETHAZINE HCL 25 MG RE SUPP
25.0000 mg | Freq: Four times a day (QID) | RECTAL | Status: DC | PRN
  Filled 2017-11-30: qty 1

## 2017-11-30 MED ORDER — DEXTROSE-NACL 5-0.45 % IV SOLN
INTRAVENOUS | Status: DC
  Administered 2017-11-30 – 2017-12-02 (×4): via INTRAVENOUS

## 2017-11-30 MED ORDER — INSULIN ASPART 100 UNIT/ML ~~LOC~~ SOLN
0.0000 [IU] | Freq: Three times a day (TID) | SUBCUTANEOUS | Status: DC
  Administered 2017-11-30: 9 [IU] via SUBCUTANEOUS

## 2017-11-30 MED ORDER — INSULIN REGULAR(HUMAN) IN NACL 100-0.9 UT/100ML-% IV SOLN
INTRAVENOUS | Status: DC
  Administered 2017-11-30: 3.6 [IU]/h via INTRAVENOUS
  Filled 2017-11-30 (×2): qty 100

## 2017-11-30 NOTE — Progress Notes (Signed)
Inpatient Diabetes Program Recommendations  AACE/ADA: New Consensus Statement on Inpatient Glycemic Control (2015)  Target Ranges:  Prepandial:   less than 140 mg/dL      Peak postprandial:   less than 180 mg/dL (1-2 hours)      Critically ill patients:  140 - 180 mg/dL   Lab Results  Component Value Date   GLUCAP 385 (H) 11/30/2017   HGBA1C 8.1 (H) 11/02/2017    Review of Glycemic Control  Blood sugars 422, 440, 385 this am. 11/11 CBGs 92-330 mg/dL.  Pt unable to control blood sugars with insulin pump. 5th admission with uncontrolled blood sugars/DKA.  Basal Rate: 1.4 units/hour Total Basal insulin per 24 hour period= 33.6 units Insulin to Carb Ratio- 1 unit Novolog for every 10 grams of Carbohydrates Insulin Sensitivity- 1 unit Novolog for every 50 mg/dl > Target CBG of 150 mg/dl  Inpatient Diabetes Program Recommendations:     Remove insulin pump and begin SQ basal/bolus insulin. Lantus 30 units Q24H Novolog 0-9 units tidwc and hs Novolog 6 units tidwc for meal coverage insulin if pt eats > 50% meal.  Pt needs to see PCP who manages her insulin pump prior to restarting.  Thank you. Lorenda Peck, RD, LDN, CDE Inpatient Diabetes Coordinator [phone]

## 2017-11-30 NOTE — Progress Notes (Signed)
Pts CBG is 456 MD paged and advised to give 15 units novolog subque for coverage.

## 2017-11-30 NOTE — Progress Notes (Signed)
PROGRESS NOTE    [name]  BZJ:[id number] DOB: 06-06-1990 DOA: 11/29/2017 PCP: Vicenta Aly, FNP      Brief Narrative:  [name] is a 27 y.o. F with IDDM on pump, gastroparesis, known cannabis hyperemesis, and frequent admissions for N/V who presents with nausea and vomiting, not possible to control in the ER.     Assessment & Plan:  Nausea vomiting Presumably from gastroparesis (had positive GE study in 2018).  There is also conflicting reports of THC use, raising question of cannabis hyperemesis.   Capsaicin abdomen cream trial failed.  She is persistently nauseated, unable to tolerate PO intake today. -She has no indications for opiates, which are relatively contraindicated in gastroparesis. -Start erythromycin 250 TID IV for gastroparesis now that QT is normal  -May have phenergan, hydroxyzine or ondansetron for nausea -Dicyclomine for pain/cramps -Scopolamine patch ordered   Diabetes Glucoses up to 400 overnight.  Seen by DM educator, unable to articulate appropriate use of insulin pump.   -Stop insulin pump -Start glargine 30 daily -SSI and mealtime insulin ordered  Prolonged QTc  QTc >600 in ER after Haldol, reglan, ondansetron.  Normalized this morning.  Sinus tachycardia Agree with admitting, ?withdrawal syndrome, as this appears considerably more than expected from dehydration.  Is this opiate withdrawal (diarrhea, hiccups, tachycardia, malaise, cramps all noted...) -Clonidine PRN for severe tachycardia (if BP allows) -Hydroxyzine PRN  -Bentyl PRN  Anemia of chronic disease Stable relative to baseline       DVT prophylaxis: SCDs Code Status: FULL Family Communication: None present MDM and disposition Plan:  The below labs and imaging reports were reviewed and summarized above.  Medication management as above.  The patient is still vomiting at the end of the observation period, still tachycardic >100 bpm, requiring IV fluids,  has  had no relief of symptoms.  IV erythromycin started.  Insulin adjusted.    Will continue IV fliuds and antiemetics.         Objective: Vitals:   11/29/17 1435 11/29/17 1957 11/30/17 0415 11/30/17 1426  BP:  108/70 120/81 106/64  Pulse: (!) 111 88 99 (!) 130  Resp:  20 20   Temp:  98.6 F (37 C) 99.2 F (37.3 C) 99.5 F (37.5 C)  TempSrc:  Oral Oral Oral  SpO2:  100% 100% 100%    Intake/Output Summary (Last 24 hours) at 11/30/2017 1549 Last data filed at 11/30/2017 0843 Gross per 24 hour  Intake 0 ml  Output -  Net 0 ml   There were no vitals filed for this visit.  Examination:   General:    Responds appropriately to questions.  Eye contact, dress and hygiene appropriate.  Thin adult female, appears uncomfortable from nausea HEENT: Corneas clear, conjunctivae and sclerae normal without injection or icterus, lids and lashes normal.  Visual tracking smooth.  OP tacky dry without erythema, exudates, cobblestoning, or ulcers.  No airway deformities.  Neck supple.   Cardiac: Tachycardic, nl S1-S2, no murmurs, rubs, gallops.  Capillary refill is less than 2 seconds.  No LE edema. Respiratory: Normal respiratory rate and rhythm.  CTAB without rales or wheezes. Abdomen: BS present.  TTP and voluntary guarding diffusely, nonfocal.  No masses or HSM. Extremities: No deformities/injuries.  5/5 grip strength and upper extremity flexion/extension, symmetrically.  Extremities are warm and well-perfused. Neuro:   Speech is fluent.  Naming is grossly intact, and the patient's recall, recent and remote, as well as general fund of knowledge seem within normal limits.  Muscle tone normal, without fasciculations.  Moves all extremities equally and with normal coordination.  Attention span and concentration are within normal limits.  Psych: BLunted affect. Attention normal.   Normal rate and rhythm of speech.  Thought content appropriate, and thought process linear.  No SI/HI, aural or visual  hallucinations or delusions. Attention and concentration are normal.       Data Reviewed: I have personally reviewed following labs and imaging studies:  CBC: Recent Labs  Lab 11/29/17 0153 11/29/17 0208 11/29/17 1203 11/30/17 0634  WBC 14.1*  --  8.0 7.9  NEUTROABS 13.2*  --  6.1  --   HGB 13.1 13.9 10.4* 11.0*  HCT 41.7 41.0 33.4* 34.4*  MCV 89.7  --  90.5 89.6  PLT 456*  --  366 PLATELET CLUMPS NOTED ON SMEAR, UNABLE TO ESTIMATE   Basic Metabolic Panel: Recent Labs  Lab 11/29/17 0153 11/29/17 0208 11/29/17 1203 11/30/17 0634  NA 138 137 140 135  K 4.1 4.0 3.5 4.8  CL 103 102 111 105  CO2 21*  --  21* 18*  GLUCOSE 330* 324* 115* 440*  BUN 14 13 13 11   CREATININE 0.85 0.70 0.63 0.84  CALCIUM 9.4  --  8.3* 8.3*   GFR: Estimated Creatinine Clearance: 82.5 mL/min (by C-G formula based on SCr of 0.84 mg/dL). Liver Function Tests: Recent Labs  Lab 11/29/17 0153 11/29/17 1203  AST 16 13*  ALT 14 12  ALKPHOS 80 58  BILITOT 0.7 0.6  PROT 7.9 5.9*  ALBUMIN 4.4 3.3*   Recent Labs  Lab 11/29/17 0153  LIPASE 16   No results for input(s): AMMONIA in the last 168 hours. Coagulation Profile: No results for input(s): INR, PROTIME in the last 168 hours. Cardiac Enzymes: No results for input(s): CKTOTAL, CKMB, CKMBINDEX, TROPONINI in the last 168 hours. BNP (last 3 results) No results for input(s): PROBNP in the last 8760 hours. HbA1C: No results for input(s): HGBA1C in the last 72 hours. CBG: Recent Labs  Lab 11/29/17 2015 11/30/17 0035 11/30/17 0417 11/30/17 0741 11/30/17 1151  GLUCAP 134* 279* 422* 385* 369*   Lipid Profile: No results for input(s): CHOL, HDL, LDLCALC, TRIG, CHOLHDL, LDLDIRECT in the last 72 hours. Thyroid Function Tests: No results for input(s): TSH, T4TOTAL, FREET4, T3FREE, THYROIDAB in the last 72 hours. Anemia Panel: No results for input(s): VITAMINB12, FOLATE, FERRITIN, TIBC, IRON, RETICCTPCT in the last 72 hours. Urine  analysis:    Component Value Date/Time   COLORURINE RED (A) 11/14/2017 1815   APPEARANCEUR CLOUDY (A) 11/14/2017 1815   LABSPEC 1.023 11/14/2017 1815   PHURINE 6.0 11/14/2017 1815   GLUCOSEU >=500 (A) 11/14/2017 1815   GLUCOSEU >=1000 11/07/2012 1205   HGBUR LARGE (A) 11/14/2017 1815   BILIRUBINUR NEGATIVE 11/14/2017 1815   KETONESUR 80 (A) 11/17/2017 1005   PROTEINUR 100 (A) 11/14/2017 1815   UROBILINOGEN 0.2 11/24/2014 1045   NITRITE NEGATIVE 11/14/2017 1815   LEUKOCYTESUR TRACE (A) 11/14/2017 1815   Sepsis Labs: @LABRCNTIP (procalcitonin:4,lacticacidven:4)  )No results found for this or any previous visit (from the past 240 hour(s)).       Radiology Studies: Dg Abd Acute W/chest  Result Date: 11/29/2017 CLINICAL DATA:  Persistent lower abdominal pain with nausea and vomiting. EXAM: DG ABDOMEN ACUTE W/ 1V CHEST COMPARISON:  Abdominal CT scan of November 26, 2017 and abdominal radiographs of November 17, 2017 FINDINGS: The lungs are well-expanded and clear. The heart and pulmonary vascularity are normal. The mediastinum is normal in width.  The trachea is midline. Within the abdomen there is a relative paucity of bowel gas. The stool burden is not excessive. There is no evidence of a fecal impaction. There are surgical clips in the gallbladder fossa. The bony structures exhibit no acute abnormalities. IMPRESSION: There is no acute cardiopulmonary abnormality. No acute intra-abdominal abnormality is observed either. Electronically Signed   By: David  Martinique M.D.   On: 11/29/2017 08:02        Scheduled Meds: . insulin aspart  0-5 Units Subcutaneous QHS  . insulin aspart  0-9 Units Subcutaneous TID WC  . insulin aspart  6 Units Subcutaneous TID WC  . insulin glargine  30 Units Subcutaneous Daily   Continuous Infusions: . erythromycin       LOS: 0 days    Time spent: 25 minutes    Edwin Dada, MD [facility] 11/30/2017, 3:49 PM     Pager  [phone] --- please page though AMION:  [email or web address] Password [email or web address] If 7PM-7AM, please contact night-coverage

## 2017-12-01 DIAGNOSIS — K3184 Gastroparesis: Secondary | ICD-10-CM

## 2017-12-01 DIAGNOSIS — E1010 Type 1 diabetes mellitus with ketoacidosis without coma: Principal | ICD-10-CM

## 2017-12-01 DIAGNOSIS — E1143 Type 2 diabetes mellitus with diabetic autonomic (poly)neuropathy: Secondary | ICD-10-CM

## 2017-12-01 DIAGNOSIS — K219 Gastro-esophageal reflux disease without esophagitis: Secondary | ICD-10-CM

## 2017-12-01 LAB — BASIC METABOLIC PANEL
ANION GAP: 12 (ref 5–15)
Anion gap: 13 (ref 5–15)
Anion gap: 8 (ref 5–15)
Anion gap: 7 (ref 5–15)
Anion gap: 12 (ref 5–15)
Anion gap: 13 (ref 5–15)
BUN: 16 mg/dL (ref 6–20)
BUN: 15 mg/dL (ref 6–20)
BUN: 14 mg/dL (ref 6–20)
BUN: 13 mg/dL (ref 6–20)
BUN: 13 mg/dL (ref 6–20)
BUN: 12 mg/dL (ref 6–20)
CALCIUM: 8.5 mg/dL — AB (ref 8.9–10.3)
CALCIUM: 7.8 mg/dL — AB (ref 8.9–10.3)
CHLORIDE: 115 mmol/L — AB (ref 98–111)
CHLORIDE: 111 mmol/L (ref 98–111)
CHLORIDE: 113 mmol/L — AB (ref 98–111)
CHLORIDE: 112 mmol/L — AB (ref 98–111)
CHLORIDE: 110 mmol/L (ref 98–111)
CO2: 9 mmol/L — AB (ref 22–32)
CO2: 11 mmol/L — AB (ref 22–32)
CO2: 13 mmol/L — AB (ref 22–32)
CO2: 16 mmol/L — AB (ref 22–32)
CO2: 12 mmol/L — AB (ref 22–32)
CO2: 11 mmol/L — ABNORMAL LOW (ref 22–32)
CREATININE: 0.93 mg/dL (ref 0.44–1.00)
CREATININE: 0.86 mg/dL (ref 0.44–1.00)
CREATININE: 0.87 mg/dL (ref 0.44–1.00)
CREATININE: 0.68 mg/dL (ref 0.44–1.00)
CREATININE: 0.81 mg/dL (ref 0.44–1.00)
Calcium: 8.5 mg/dL — ABNORMAL LOW (ref 8.9–10.3)
Calcium: 8 mg/dL — ABNORMAL LOW (ref 8.9–10.3)
Calcium: 8.1 mg/dL — ABNORMAL LOW (ref 8.9–10.3)
Calcium: 8.3 mg/dL — ABNORMAL LOW (ref 8.9–10.3)
Chloride: 112 mmol/L — ABNORMAL HIGH (ref 98–111)
Creatinine, Ser: 0.93 mg/dL (ref 0.44–1.00)
GFR calc Af Amer: >60 mL/min (ref >60)
GFR calc Af Amer: >60 mL/min (ref >60)
GFR calc Af Amer: >60 mL/min (ref >60)
GFR calc non Af Amer: >60 mL/min (ref >60)
GFR calc non Af Amer: >60 mL/min (ref >60)
GFR calc non Af Amer: >60 mL/min (ref >60)
GFR calc non Af Amer: >60 mL/min (ref >60)
GFR calc non Af Amer: >60 mL/min (ref >60)
GFR calc non Af Amer: >60 mL/min (ref >60)
GLUCOSE: 190 mg/dL — AB (ref 70–99)
GLUCOSE: 300 mg/dL — AB (ref 70–99)
Glucose, Bld: 153 mg/dL — ABNORMAL HIGH (ref 70–99)
Glucose, Bld: 232 mg/dL — ABNORMAL HIGH (ref 70–99)
Glucose, Bld: 148 mg/dL — ABNORMAL HIGH (ref 70–99)
Glucose, Bld: 246 mg/dL — ABNORMAL HIGH (ref 70–99)
POTASSIUM: 4.5 mmol/L (ref 3.5–5.1)
POTASSIUM: 3.7 mmol/L (ref 3.5–5.1)
POTASSIUM: 4.2 mmol/L (ref 3.5–5.1)
POTASSIUM: 3.9 mmol/L (ref 3.5–5.1)
Potassium: 4.8 mmol/L (ref 3.5–5.1)
Potassium: 3.8 mmol/L (ref 3.5–5.1)
SODIUM: 136 mmol/L (ref 135–145)
SODIUM: 134 mmol/L — AB (ref 135–145)
Sodium: 135 mmol/L (ref 135–145)
Sodium: 133 mmol/L — ABNORMAL LOW (ref 135–145)
Sodium: 136 mmol/L (ref 135–145)
Sodium: 136 mmol/L (ref 135–145)

## 2017-12-01 LAB — GLUCOSE, CAPILLARY
GLUCOSE-CAPILLARY: 125 mg/dL — AB (ref 70–99)
GLUCOSE-CAPILLARY: 257 mg/dL — AB (ref 70–99)
GLUCOSE-CAPILLARY: 208 mg/dL — AB (ref 70–99)
GLUCOSE-CAPILLARY: 141 mg/dL — AB (ref 70–99)
GLUCOSE-CAPILLARY: 133 mg/dL — AB (ref 70–99)
GLUCOSE-CAPILLARY: 103 mg/dL — AB (ref 70–99)
GLUCOSE-CAPILLARY: 128 mg/dL — AB (ref 70–99)
GLUCOSE-CAPILLARY: 168 mg/dL — AB (ref 70–99)
GLUCOSE-CAPILLARY: 279 mg/dL — AB (ref 70–99)
Glucose-Capillary: 112 mg/dL — ABNORMAL HIGH (ref 70–99)
Glucose-Capillary: 123 mg/dL — ABNORMAL HIGH (ref 70–99)
Glucose-Capillary: 143 mg/dL — ABNORMAL HIGH (ref 70–99)
Glucose-Capillary: 148 mg/dL — ABNORMAL HIGH (ref 70–99)
Glucose-Capillary: 173 mg/dL — ABNORMAL HIGH (ref 70–99)
Glucose-Capillary: 173 mg/dL — ABNORMAL HIGH (ref 70–99)
Glucose-Capillary: 177 mg/dL — ABNORMAL HIGH (ref 70–99)
Glucose-Capillary: 213 mg/dL — ABNORMAL HIGH (ref 70–99)
Glucose-Capillary: 238 mg/dL — ABNORMAL HIGH (ref 70–99)
Glucose-Capillary: 249 mg/dL — ABNORMAL HIGH (ref 70–99)
Glucose-Capillary: 278 mg/dL — ABNORMAL HIGH (ref 70–99)
Glucose-Capillary: 293 mg/dL — ABNORMAL HIGH (ref 70–99)

## 2017-12-01 LAB — URINALYSIS, ROUTINE W REFLEX MICROSCOPIC
BILIRUBIN URINE: NEGATIVE
Bacteria, UA: NONE SEEN
Glucose, UA: >=500 mg/dL — AB
KETONES UR: 80 mg/dL — AB
Nitrite: NEGATIVE
PROTEIN: 30 mg/dL — AB
SPECIFIC GRAVITY, URINE: 1.011 (ref 1.005–1.030)
pH: 6 (ref 5.0–8.0)

## 2017-12-01 LAB — RAPID URINE DRUG SCREEN, HOSP PERFORMED
Amphetamines: NOT DETECTED
BARBITURATES: NOT DETECTED
Benzodiazepines: NOT DETECTED
Cocaine: NOT DETECTED
Opiates: NOT DETECTED
TETRAHYDROCANNABINOL: NOT DETECTED

## 2017-12-01 LAB — CBC
HEMATOCRIT: 34 % — AB (ref 36.0–46.0)
HEMOGLOBIN: 10.6 g/dL — AB (ref 12.0–15.0)
MCH: 28.6 pg (ref 26.0–34.0)
MCHC: 31.2 g/dL (ref 30.0–36.0)
MCV: 91.6 fL (ref 80.0–100.0)
Platelets: 375 10*3/uL (ref 150–400)
RBC: 3.71 MIL/uL — AB (ref 3.87–5.11)
RDW: 14.2 % (ref 11.5–15.5)
WBC: 12.1 10*3/uL — ABNORMAL HIGH (ref 4.0–10.5)
nRBC: 0 % (ref 0.0–0.2)

## 2017-12-01 MED ORDER — LISINOPRIL 2.5 MG PO TABS
5.0000 mg | ORAL_TABLET | Freq: Every day | ORAL | Status: DC
  Administered 2017-12-01: 5 mg via ORAL
  Filled 2017-12-01 (×2): qty 2

## 2017-12-01 MED ORDER — KETOROLAC TROMETHAMINE 30 MG/ML IJ SOLN
30.0000 mg | Freq: Four times a day (QID) | INTRAMUSCULAR | Status: AC | PRN
  Administered 2017-12-01 – 2017-12-06 (×9): 30 mg via INTRAVENOUS
  Filled 2017-12-01 (×11): qty 1

## 2017-12-01 MED ORDER — SODIUM BICARBONATE 650 MG PO TABS
650.0000 mg | ORAL_TABLET | Freq: Once | ORAL | Status: AC
  Administered 2017-12-01: 650 mg via ORAL
  Filled 2017-12-01: qty 1

## 2017-12-01 MED ORDER — FAMOTIDINE IN NACL 20-0.9 MG/50ML-% IV SOLN
20.0000 mg | INTRAVENOUS | Status: DC
  Administered 2017-12-01 – 2017-12-07 (×6): 20 mg via INTRAVENOUS
  Filled 2017-12-01 (×7): qty 50

## 2017-12-01 MED ORDER — ACETAMINOPHEN 325 MG PO TABS
650.0000 mg | ORAL_TABLET | ORAL | Status: DC | PRN
  Administered 2017-12-01 – 2017-12-03 (×8): 650 mg via ORAL
  Filled 2017-12-01 (×8): qty 2

## 2017-12-01 MED ORDER — ENOXAPARIN SODIUM 30 MG/0.3ML ~~LOC~~ SOLN
30.0000 mg | SUBCUTANEOUS | Status: DC
  Administered 2017-12-01 – 2017-12-07 (×7): 30 mg via SUBCUTANEOUS
  Filled 2017-12-01 (×8): qty 0.3

## 2017-12-01 MED ORDER — SODIUM CHLORIDE 0.9 % IV BOLUS
1000.0000 mL | Freq: Once | INTRAVENOUS | Status: AC
  Administered 2017-12-01: 1000 mL via INTRAVENOUS

## 2017-12-01 MED ORDER — ACETAMINOPHEN 650 MG RE SUPP: 650.0000 mg | RECTAL | Status: DC | PRN

## 2017-12-01 MED ORDER — SODIUM CHLORIDE 0.9 % IV SOLN
1.0000 g | INTRAVENOUS | Status: DC
  Administered 2017-12-01 – 2017-12-03 (×3): 1 g via INTRAVENOUS
  Filled 2017-12-01 (×3): qty 1

## 2017-12-01 MED ORDER — HYDRALAZINE HCL 20 MG/ML IJ SOLN: 10.0000 mg | Freq: Four times a day (QID) | INTRAMUSCULAR | Status: DC | PRN

## 2017-12-01 NOTE — Progress Notes (Signed)
PROGRESS NOTE    [name]  OVF:[id number] DOB: [birth year] DOA: 11/29/2017 PCP: Vicenta Aly, FNP    Brief Narrative:  [name] is a 27 y.o. F with IDDM on pump, gastroparesis, known cannabis hyperemesis, and frequent admissions for N/V who presents with nausea and vomiting, not possible to control in the ER. Patient noted to go into DKA yesterday, 11/30/2017 with anion gap as high as 24, acidosis with a bicarb as low as 8, ketones noted in the urine on admission, with a blood glucose level of 442.  Patient noted with nausea and inability to keep anything down.  Patient placed on the insulin drip, IV fluids.  Patient also started on IV erythromycin for gastroparesis.    Assessment & Plan:   Principal Problem:   DKA (diabetic ketoacidoses) ([place]) Active Problems:   Nausea & vomiting   Uncontrolled type 1 diabetes mellitus (HCC)   Sinus tachycardia   Diabetic gastroparesis (HCC)   Protein-calorie malnutrition, severe   GERD (gastroesophageal reflux disease)   DKA, type 1 (HCC)   Tachycardia  #1 DKA/uncontrolled type 1 diabetes Questionable etiology.  Patient denies any respiratroy symptoms.  Patient states has been compliant with her medications and has not missed any doses.  Hemoglobin A1c 8.1 on 11/02/2017.  Urinalysis on admission with greater than 500 glucose, positive ketones, moderate leukocytes, nitrite negative, greater than 50 WBCs.  Patient noted on 11/30/2017 to have a anion gap is high as 24, acidosis with a bicarb as low as 8 with a blood glucose level of 442.  Repeat labs pending this morning.  Check a UA with cultures and sensitivities.  Anion gap currently at 13 with a bicarb of 11 and a glucose of 190.  Patient currently n.p.o.  Continue insulin drip and serial BMETs, until acidosis is resolved.  Give 1 L normal saline bolus.  Continue IV fluids.  Antiemetics.  Supportive care.  Follow.  2.  Nausea and vomiting Presumed secondary to gastroparesis  (positive gastric emptying study 2018) versus secondary to early DKA.  Also concern for conflicting reports of THC use raising question of cannabis hyperemesis.  Patient denies any recent THC use.  UDS was negative.  Patient noted to be persistently nauseated and unable to tolerate oral intake.  Patient started on erythromycin 250 mg 3 times daily IV for gastroparesis now QT interval has normalized.  Continue anti-emetics.  Supportive care.  Follow.  3.  Prolonged QT interval QTC was greater than 600 on admission.  QTC has normalized.  Follow.  4.  Sinus tachycardia Questionable etiology.  May be secondary to problem #1.  There was a concern for possible withdrawal syndrome however patient denies any polysubstance use.  Continue clonidine as needed.  Hydroxyzine as needed.  Bentyl as needed.  Treat #1.  IV fluids.  Supportive care.  5.  Gastroesophageal reflux disease Place on IV Pepcid.  6.  Anemia of chronic disease No overt bleeding.  H&H stable.   DVT prophylaxis: Lovenox Code Status: Full Family Communication: Updated patient.  No family at bedside. Disposition Plan: Remain in stepdown unit today.   Consultants:   None  Procedures:   Acute abdominal series 11/29/2017    Antimicrobials:   None   Subjective: Patient noted to be nauseous earlier this morning with some dry heaves.  Patient given some antiemetics this morning.  Patient also with complaints of diffuse pain.  Patient asking whether interval of Tylenol can be decreased from every 6 hours.  No chest pain.  No shortness of breath.  Patient denies any dysuria.  Objective: Vitals:   12/01/17 0700 12/01/17 0730 12/01/17 0800 12/01/17 0820  BP:    (!) 171/74  Pulse: (!) 120 (!) 128  (!) 126  Resp: (!) 23 (!) 25  (!) 26  Temp:   99.6 F (37.6 C)   TempSrc:   Oral   SpO2: 99% 100%  100%    Intake/Output Summary (Last 24 hours) at 12/01/2017 0942 Last data filed at 12/01/2017 0730 Gross per 24 hour  Intake  2549.23 ml  Output 1300 ml  Net 1249.23 ml   There were no vitals filed for this visit.  Examination:  General exam: Appears calm and comfortable  Respiratory system: Clear to auscultation. Respiratory effort normal. Cardiovascular system: Tachycardic.  No JVD, no murmurs, no rubs, no gallops.  No pedal edema.  Gastrointestinal system: Abdomen is nondistended, soft and some diffuse tenderness to palpation.  No organomegaly or masses felt. Normal bowel sounds heard. Central nervous system: Alert and oriented. No focal neurological deficits. Extremities: Symmetric 5 x 5 power. Skin: No rashes, lesions or ulcers Psychiatry: Judgement and insight appear normal. Mood & affect appropriate.     Data Reviewed: I have personally reviewed following labs and imaging studies  CBC: Recent Labs  Lab 11/29/17 0153 11/29/17 0208 11/29/17 1203 11/30/17 0634 12/01/17 0522  WBC 14.1*  --  8.0 7.9 12.1*  NEUTROABS 13.2*  --  6.1  --   --   HGB 13.1 13.9 10.4* 11.0* 10.6*  HCT 41.7 41.0 33.4* 34.4* 34.0*  MCV 89.7  --  90.5 89.6 91.6  PLT 456*  --  366 PLATELET CLUMPS NOTED ON SMEAR, UNABLE TO ESTIMATE 193   Basic Metabolic Panel: Recent Labs  Lab 11/30/17 0634 11/30/17 1624 11/30/17 2134 12/01/17 0136 12/01/17 0522  NA 135 134* 136 136 135  K 4.8 5.2* 4.5 4.8 4.5  CL 105 102 109 115* 111  CO2 18* 8* 9* 9* 11*  GLUCOSE 440* 442* 289* 153* 190*  BUN 11 19 18 16 15   CREATININE 0.84 1.11* 1.15* 0.93 0.86  CALCIUM 8.3* 9.1 8.5* 8.5* 8.5*   GFR: Estimated Creatinine Clearance: 80.6 mL/min (by C-G formula based on SCr of 0.86 mg/dL). Liver Function Tests: Recent Labs  Lab 11/29/17 0153 11/29/17 1203  AST 16 13*  ALT 14 12  ALKPHOS 80 58  BILITOT 0.7 0.6  PROT 7.9 5.9*  ALBUMIN 4.4 3.3*   Recent Labs  Lab 11/29/17 0153  LIPASE 16   No results for input(s): AMMONIA in the last 168 hours. Coagulation Profile: No results for input(s): INR, PROTIME in the last 168  hours. Cardiac Enzymes: No results for input(s): CKTOTAL, CKMB, CKMBINDEX, TROPONINI in the last 168 hours. BNP (last 3 results) No results for input(s): PROBNP in the last 8760 hours. HbA1C: No results for input(s): HGBA1C in the last 72 hours. CBG: Recent Labs  Lab 12/01/17 0407 12/01/17 0502 12/01/17 0604 12/01/17 0702 12/01/17 0813  GLUCAP 112* 143* 238* 257* 208*   Lipid Profile: No results for input(s): CHOL, HDL, LDLCALC, TRIG, CHOLHDL, LDLDIRECT in the last 72 hours. Thyroid Function Tests: No results for input(s): TSH, T4TOTAL, FREET4, T3FREE, THYROIDAB in the last 72 hours. Anemia Panel: No results for input(s): VITAMINB12, FOLATE, FERRITIN, TIBC, IRON, RETICCTPCT in the last 72 hours. Sepsis Labs: No results for input(s): PROCALCITON, LATICACIDVEN in the last 168 hours.  Recent Results (from the past 240 hour(s))  MRSA PCR Screening  Status: None   Collection Time: 11/30/17  7:25 PM  Result Value Ref Range Status   MRSA by PCR NEGATIVE NEGATIVE Final    Comment:        The GeneXpert MRSA Assay (FDA approved for NASAL specimens only), is one component of a comprehensive MRSA colonization surveillance program. It is not intended to diagnose MRSA infection nor to guide or monitor treatment for MRSA infections. Performed at [facility], [place] [address] [place], [place] [address]          Radiology Studies: No results found.      Scheduled Meds: Continuous Infusions: . sodium chloride Stopped (11/30/17 2205)  . dextrose 5 % and 0.45% NaCl 125 mL/hr at 12/01/17 0615  . erythromycin Stopped (12/01/17 0715)  . insulin 4.4 Units/hr (12/01/17 0815)     LOS: 1 day    Time spent: 40 minutes    Irine Seal, MD [facility] Pager [phone]  If 7PM-7AM, please contact night-coverage [email or web address] Password [facility] 12/01/2017, 9:42 AM

## 2017-12-02 DIAGNOSIS — R52 Pain, unspecified: Secondary | ICD-10-CM

## 2017-12-02 LAB — BASIC METABOLIC PANEL
ANION GAP: 10 (ref 5–15)
ANION GAP: 7 (ref 5–15)
Anion gap: 14 (ref 5–15)
Anion gap: 9 (ref 5–15)
BUN: 8 mg/dL (ref 6–20)
BUN: 6 mg/dL (ref 6–20)
BUN: 5 mg/dL — AB (ref 6–20)
BUN: <5 mg/dL — ABNORMAL LOW (ref 6–20)
CALCIUM: 8.6 mg/dL — AB (ref 8.9–10.3)
CALCIUM: 8.2 mg/dL — AB (ref 8.9–10.3)
CALCIUM: 8.1 mg/dL — AB (ref 8.9–10.3)
CHLORIDE: 110 mmol/L (ref 98–111)
CO2: 16 mmol/L — ABNORMAL LOW (ref 22–32)
CO2: 16 mmol/L — ABNORMAL LOW (ref 22–32)
CO2: 21 mmol/L — AB (ref 22–32)
CO2: 21 mmol/L — AB (ref 22–32)
CREATININE: 0.67 mg/dL (ref 0.44–1.00)
Calcium: 8.5 mg/dL — ABNORMAL LOW (ref 8.9–10.3)
Chloride: 111 mmol/L (ref 98–111)
Chloride: 106 mmol/L (ref 98–111)
Chloride: 109 mmol/L (ref 98–111)
Creatinine, Ser: 0.72 mg/dL (ref 0.44–1.00)
Creatinine, Ser: 0.63 mg/dL (ref 0.44–1.00)
Creatinine, Ser: 0.57 mg/dL (ref 0.44–1.00)
GFR calc Af Amer: >60 mL/min (ref >60)
GFR calc Af Amer: >60 mL/min (ref >60)
GFR calc non Af Amer: >60 mL/min (ref >60)
GFR calc non Af Amer: >60 mL/min (ref >60)
GLUCOSE: 245 mg/dL — AB (ref 70–99)
GLUCOSE: 163 mg/dL — AB (ref 70–99)
GLUCOSE: 130 mg/dL — AB (ref 70–99)
Glucose, Bld: 147 mg/dL — ABNORMAL HIGH (ref 70–99)
POTASSIUM: 3.6 mmol/L (ref 3.5–5.1)
POTASSIUM: 2.8 mmol/L — AB (ref 3.5–5.1)
Potassium: 3.5 mmol/L (ref 3.5–5.1)
Potassium: 3 mmol/L — ABNORMAL LOW (ref 3.5–5.1)
SODIUM: 137 mmol/L (ref 135–145)
Sodium: 136 mmol/L (ref 135–145)
Sodium: 139 mmol/L (ref 135–145)
Sodium: 138 mmol/L (ref 135–145)

## 2017-12-02 LAB — CBC
HEMATOCRIT: 31.9 % — AB (ref 36.0–46.0)
HEMOGLOBIN: 10.5 g/dL — AB (ref 12.0–15.0)
MCH: 28.6 pg (ref 26.0–34.0)
MCHC: 32.9 g/dL (ref 30.0–36.0)
MCV: 86.9 fL (ref 80.0–100.0)
Platelets: 329 10*3/uL (ref 150–400)
RBC: 3.67 MIL/uL — ABNORMAL LOW (ref 3.87–5.11)
RDW: 14 % (ref 11.5–15.5)
WBC: 9.6 10*3/uL (ref 4.0–10.5)
nRBC: 0 % (ref 0.0–0.2)

## 2017-12-02 LAB — GLUCOSE, CAPILLARY
GLUCOSE-CAPILLARY: 108 mg/dL — AB (ref 70–99)
GLUCOSE-CAPILLARY: 134 mg/dL — AB (ref 70–99)
GLUCOSE-CAPILLARY: 151 mg/dL — AB (ref 70–99)
GLUCOSE-CAPILLARY: 154 mg/dL — AB (ref 70–99)
GLUCOSE-CAPILLARY: 158 mg/dL — AB (ref 70–99)
GLUCOSE-CAPILLARY: 199 mg/dL — AB (ref 70–99)
Glucose-Capillary: 109 mg/dL — ABNORMAL HIGH (ref 70–99)
Glucose-Capillary: 116 mg/dL — ABNORMAL HIGH (ref 70–99)
Glucose-Capillary: 116 mg/dL — ABNORMAL HIGH (ref 70–99)
Glucose-Capillary: 118 mg/dL — ABNORMAL HIGH (ref 70–99)
Glucose-Capillary: 119 mg/dL — ABNORMAL HIGH (ref 70–99)
Glucose-Capillary: 125 mg/dL — ABNORMAL HIGH (ref 70–99)
Glucose-Capillary: 127 mg/dL — ABNORMAL HIGH (ref 70–99)
Glucose-Capillary: 131 mg/dL — ABNORMAL HIGH (ref 70–99)
Glucose-Capillary: 144 mg/dL — ABNORMAL HIGH (ref 70–99)
Glucose-Capillary: 158 mg/dL — ABNORMAL HIGH (ref 70–99)
Glucose-Capillary: 164 mg/dL — ABNORMAL HIGH (ref 70–99)
Glucose-Capillary: 168 mg/dL — ABNORMAL HIGH (ref 70–99)
Glucose-Capillary: 210 mg/dL — ABNORMAL HIGH (ref 70–99)
Glucose-Capillary: 214 mg/dL — ABNORMAL HIGH (ref 70–99)
Glucose-Capillary: 216 mg/dL — ABNORMAL HIGH (ref 70–99)
Glucose-Capillary: 241 mg/dL — ABNORMAL HIGH (ref 70–99)

## 2017-12-02 LAB — CHLORIDE, URINE, RANDOM: CHLORIDE URINE: 118 mmol/L

## 2017-12-02 LAB — NA AND K (SODIUM & POTASSIUM), RAND UR
POTASSIUM UR: 12 mmol/L
SODIUM UR: 99 mmol/L

## 2017-12-02 MED ORDER — ENALAPRILAT 1.25 MG/ML IV SOLN
0.6250 mg | Freq: Four times a day (QID) | INTRAVENOUS | Status: DC
  Administered 2017-12-02 – 2017-12-05 (×12): 0.625 mg via INTRAVENOUS
  Filled 2017-12-02 (×2): qty 1
  Filled 2017-12-02: qty 0.5
  Filled 2017-12-02: qty 1
  Filled 2017-12-02: qty 0.5
  Filled 2017-12-02 (×3): qty 1
  Filled 2017-12-02: qty 0.5
  Filled 2017-12-02 (×2): qty 1
  Filled 2017-12-02: qty 0.5
  Filled 2017-12-02: qty 1
  Filled 2017-12-02: qty 0.5

## 2017-12-02 MED ORDER — MORPHINE SULFATE (PF) 2 MG/ML IV SOLN
2.0000 mg | Freq: Once | INTRAVENOUS | Status: AC
  Administered 2017-12-02: 2 mg via INTRAVENOUS
  Filled 2017-12-02: qty 1

## 2017-12-02 MED ORDER — CHLORHEXIDINE GLUCONATE 0.12 % MT SOLN
15.0000 mL | Freq: Two times a day (BID) | OROMUCOSAL | Status: DC
  Administered 2017-12-02 – 2017-12-07 (×9): 15 mL via OROMUCOSAL
  Filled 2017-12-02 (×8): qty 15

## 2017-12-02 MED ORDER — INSULIN GLARGINE 100 UNIT/ML ~~LOC~~ SOLN
30.0000 [IU] | Freq: Every day | SUBCUTANEOUS | Status: DC
  Administered 2017-12-02: 30 [IU] via SUBCUTANEOUS
  Filled 2017-12-02: qty 0.3

## 2017-12-02 MED ORDER — POTASSIUM CHLORIDE CRYS ER 20 MEQ PO TBCR: 40.0000 meq | EXTENDED_RELEASE_TABLET | ORAL | Status: DC

## 2017-12-02 MED ORDER — PROCHLORPERAZINE EDISYLATE 10 MG/2ML IJ SOLN
10.0000 mg | Freq: Four times a day (QID) | INTRAMUSCULAR | Status: DC | PRN
  Administered 2017-12-02 – 2017-12-07 (×10): 10 mg via INTRAVENOUS
  Filled 2017-12-02 (×11): qty 2

## 2017-12-02 MED ORDER — METOCLOPRAMIDE HCL 5 MG/ML IJ SOLN
5.0000 mg | Freq: Four times a day (QID) | INTRAMUSCULAR | Status: DC
  Administered 2017-12-02 – 2017-12-04 (×8): 5 mg via INTRAVENOUS
  Filled 2017-12-02 (×8): qty 2

## 2017-12-02 MED ORDER — INSULIN ASPART 100 UNIT/ML ~~LOC~~ SOLN
0.0000 [IU] | Freq: Every day | SUBCUTANEOUS | Status: DC
  Administered 2017-12-05: 4 [IU] via SUBCUTANEOUS
  Administered 2017-12-06: 3 [IU] via SUBCUTANEOUS

## 2017-12-02 MED ORDER — INSULIN ASPART 100 UNIT/ML ~~LOC~~ SOLN
0.0000 [IU] | Freq: Three times a day (TID) | SUBCUTANEOUS | Status: DC
  Administered 2017-12-03: 1 [IU] via SUBCUTANEOUS
  Administered 2017-12-04 (×2): 2 [IU] via SUBCUTANEOUS
  Administered 2017-12-05: 7 [IU] via SUBCUTANEOUS
  Administered 2017-12-05: 5 [IU] via SUBCUTANEOUS
  Administered 2017-12-06: 2 [IU] via SUBCUTANEOUS
  Administered 2017-12-06: 1 [IU] via SUBCUTANEOUS
  Administered 2017-12-07 (×2): 2 [IU] via SUBCUTANEOUS

## 2017-12-02 MED ORDER — SENNOSIDES-DOCUSATE SODIUM 8.6-50 MG PO TABS
1.0000 | ORAL_TABLET | Freq: Two times a day (BID) | ORAL | Status: DC
  Administered 2017-12-02 – 2017-12-05 (×6): 1 via ORAL
  Filled 2017-12-02 (×7): qty 1

## 2017-12-02 MED ORDER — SODIUM BICARBONATE 650 MG PO TABS
650.0000 mg | ORAL_TABLET | Freq: Two times a day (BID) | ORAL | Status: DC
  Filled 2017-12-02: qty 1

## 2017-12-02 MED ORDER — POTASSIUM CHLORIDE 10 MEQ/100ML IV SOLN
10.0000 meq | INTRAVENOUS | Status: AC
  Administered 2017-12-03: 10 meq via INTRAVENOUS
  Filled 2017-12-02 (×2): qty 100

## 2017-12-02 MED ORDER — ORAL CARE MOUTH RINSE
15.0000 mL | Freq: Two times a day (BID) | OROMUCOSAL | Status: DC
  Administered 2017-12-02 – 2017-12-07 (×4): 15 mL via OROMUCOSAL

## 2017-12-02 MED ORDER — POTASSIUM CHLORIDE CRYS ER 20 MEQ PO TBCR
40.0000 meq | EXTENDED_RELEASE_TABLET | Freq: Once | ORAL | Status: AC
  Administered 2017-12-02: 40 meq via ORAL
  Filled 2017-12-02: qty 2

## 2017-12-02 MED ORDER — SODIUM CHLORIDE 0.9 % IV BOLUS
1000.0000 mL | Freq: Once | INTRAVENOUS | Status: AC
  Administered 2017-12-02: 1000 mL via INTRAVENOUS

## 2017-12-02 NOTE — Care Management Note (Signed)
Case Management Note  Patient Details  Name: [name] MRN: [id number] Date of Birth: [birth year]  Subjective/Objective:                  dka and gastroporesis  Action/Plan: Following for progression of care. Following for cm needs none present at this time.  Expected Discharge Date:                  Expected Discharge Plan:  Home/Self Care  In-House Referral:     Discharge planning Services  CM Consult  Post Acute Care Choice:    Choice offered to:     DME Arranged:    DME Agency:     HH Arranged:    HH Agency:     Status of Service:  In process, will continue to follow  If discussed at Long Length of Stay Meetings, dates discussed:    Additional Comments:  Leeroy Cha, RN 12/02/2017, 10:24 AM

## 2017-12-02 NOTE — Progress Notes (Addendum)
PROGRESS NOTE    [name]  IOE:[id number] DOB: 1990/09/24 DOA: 11/29/2017 PCP: Vicenta Aly, FNP    Brief Narrative:  [name] is a 27 y.o. F with IDDM on pump, gastroparesis, known cannabis hyperemesis, and frequent admissions for N/V who presents with nausea and vomiting, not possible to control in the ER. Patient noted to go into DKA yesterday, 11/30/2017 with anion gap as high as 24, acidosis with a bicarb as low as 8, ketones noted in the urine on admission, with a blood glucose level of 442.  Patient noted with nausea and inability to keep anything down.  Patient placed on the insulin drip, IV fluids.  Patient also started on IV erythromycin for gastroparesis.    Assessment & Plan:   Principal Problem:   DKA (diabetic ketoacidoses) ([place]) Active Problems:   Nausea & vomiting   Uncontrolled type 1 diabetes mellitus (HCC)   Sinus tachycardia   Diabetic gastroparesis (HCC)   Protein-calorie malnutrition, severe   GERD (gastroesophageal reflux disease)   DKA, type 1 (HCC)   Tachycardia  #1 DKA/uncontrolled type 1 diabetes Questionable etiology.  Patient denies any respiratroy symptoms.  Patient states has been compliant with her medications and has not missed any doses.  Hemoglobin A1c 8.1 on 11/02/2017.  Urinalysis on admission with greater than 500 glucose, positive ketones, moderate leukocytes, nitrite negative, greater than 50 WBCs.  Patient noted on 11/30/2017 to have a anion gap is high as 24, acidosis with a bicarb as low as 8 with a blood glucose level of 442.  Repeat labs pending this morning.  Repeat urinalysis with large leukocytes, nitrite negative, greater than 50 WBCs.  Patient started empirically on IV Rocephin and will treat for 3 days pending urine cultures.  Anion gap is closed however patient still acidotic with a bicarb of 16.  Check a urine sodium, potassium, chloride.  Continue IV fluids.  Give a liter of normal saline bolus.  Continue serial  basic metabolic profiles.  IV fluids.  Supportive care. Follow.  2.  Nausea and vomiting Presumed secondary to gastroparesis (positive gastric emptying study 2018) versus secondary to early DKA.  Also concern for conflicting reports of THC use raising question of cannabis hyperemesis.  Patient denies any recent THC use.  UDS was negative.  Patient noted to be persistently nauseated and unable to tolerate oral intake.  Patient currently still n.p.o. with nausea and vomiting per RN this morning.  Patient was started on erythromycin for gastroparesis now QT interval had normalized.  Will repeat EKG to follow QT interval and if still normalized we will discontinue erythromycin and place on IV Reglan.  Discontinue Zofran and placed on IV Compazine as needed.  Phenergan as needed.  IV fluids.  We will also give a soapsuds enema.  Supportive care.   3.  Prolonged QT interval QTC was greater than 600 on admission.  QTC has normalized.  Will repeat EKG this morning to follow QTC.  Follow.  4.  Sinus tachycardia Questionable etiology.  May be secondary to problem #1 with ongoing nausea and pain.  There was a concern for possible withdrawal syndrome however patient denies any polysubstance use.  Continue clonidine as needed.  Hydroxyzine as needed.  Bentyl as needed.  Treat #1.  IV fluids.  Supportive care.  5.  Gastroesophageal reflux disease Continue IV Pepcid.    6.  Anemia of chronic disease No overt bleeding.  H&H stable.  7.??  Constipation We will give a soapsuds enema and if  no results in 3 hours we will repeat.  8. diffuse pain Patient with complaints of diffuse pain all over coming from her abdomen and spreading all over.  Patient states Toradol is not helping the pain and requesting morphine or Dilaudid which she states has helped before during her prior hospitalizations.  Patient currently on Toradol as needed.  Check EKG and if QT is still normalized we will discontinue IV erythromycin and  place on IV Reglan.  We will give a one-time dose of morphine 2 mg IV x1.  Follow.   DVT prophylaxis: Lovenox Code Status: Full Family Communication: Updated patient.  No family at bedside. Disposition Plan: Remain in stepdown unit today.   Consultants:   None  Procedures:   Acute abdominal series 11/29/2017    Antimicrobials:   IV Rocephin 12/01/2017   Subjective: Patient with nausea and emesis overnight and early this morning.  Patient complaining of diffuse pain all over.  Patient states Toradol is not helping with the pain and requesting for morphine or Dilaudid as she states she is usually given this to help with her pain during previous hospitalizations.  Objective: Vitals:   12/02/17 0354 12/02/17 0400 12/02/17 0800 12/02/17 0805  BP:  (!) 165/116  (!) 152/98  Pulse:  (!) 111  (!) 123  Resp:  (!) 21  14  Temp: 99 F (37.2 C)  99.5 F (37.5 C)   TempSrc: Oral  Oral   SpO2:  100%  100%  Weight:        Intake/Output Summary (Last 24 hours) at 12/02/2017 0907 Last data filed at 12/02/2017 0600 Gross per 24 hour  Intake 3936.47 ml  Output 1100 ml  Net 2836.47 ml   Filed Weights   12/01/17 0916  Weight: 57.2 kg    Examination:  General exam: Appears calm and comfortable  Respiratory system: Lungs clear to auscultation bilaterally.  No wheezes, no crackles, no rhonchi.  Respiratory effort normal. Cardiovascular system: Tachycardic.  No murmurs, rubs or gallops.  No JVD.  No lower extremity edema. Gastrointestinal system: Abdomen is soft, nondistended, positive bowel sounds, some diffuse tenderness to palpation.  No hepatosplenomegaly.   Central nervous system: Alert and oriented. No focal neurological deficits. Extremities: Symmetric 5 x 5 power. Skin: No rashes, lesions or ulcers Psychiatry: Judgement and insight appear normal. Mood & affect appropriate.     Data Reviewed: I have personally reviewed following labs and imaging studies  CBC: Recent  Labs  Lab 11/29/17 0153 11/29/17 0208 11/29/17 1203 11/30/17 0634 12/01/17 0522 12/02/17 0301  WBC 14.1*  --  8.0 7.9 12.1* 9.6  NEUTROABS 13.2*  --  6.1  --   --   --   HGB 13.1 13.9 10.4* 11.0* 10.6* 10.5*  HCT 41.7 41.0 33.4* 34.4* 34.0* 31.9*  MCV 89.7  --  90.5 89.6 91.6 86.9  PLT 456*  --  366 PLATELET CLUMPS NOTED ON SMEAR, UNABLE TO ESTIMATE 375 151   Basic Metabolic Panel: Recent Labs  Lab 12/01/17 0911 12/01/17 1340 12/01/17 1705 12/01/17 2037 12/02/17 0134  NA 133* 136 136 134* 137  K 3.8 3.7 4.2 3.9 3.6  CL 112* 113* 112* 110 111  CO2 13* 16* 12* 11* 16*  GLUCOSE 232* 148* 246* 300* 147*  BUN 14 13 13 12 8   CREATININE 0.87 0.68 0.81 0.93 0.72  CALCIUM 7.8* 8.0* 8.1* 8.3* 8.5*   GFR: Estimated Creatinine Clearance: 86.6 mL/min (by C-G formula based on SCr of 0.72 mg/dL). Liver Function  Tests: Recent Labs  Lab 11/29/17 0153 11/29/17 1203  AST 16 13*  ALT 14 12  ALKPHOS 80 58  BILITOT 0.7 0.6  PROT 7.9 5.9*  ALBUMIN 4.4 3.3*   Recent Labs  Lab 11/29/17 0153  LIPASE 16   No results for input(s): AMMONIA in the last 168 hours. Coagulation Profile: No results for input(s): INR, PROTIME in the last 168 hours. Cardiac Enzymes: No results for input(s): CKTOTAL, CKMB, CKMBINDEX, TROPONINI in the last 168 hours. BNP (last 3 results) No results for input(s): PROBNP in the last 8760 hours. HbA1C: No results for input(s): HGBA1C in the last 72 hours. CBG: Recent Labs  Lab 12/02/17 0248 12/02/17 0348 12/02/17 0457 12/02/17 0558 12/02/17 0802  GLUCAP 116* 108* 109* 119* 214*   Lipid Profile: No results for input(s): CHOL, HDL, LDLCALC, TRIG, CHOLHDL, LDLDIRECT in the last 72 hours. Thyroid Function Tests: No results for input(s): TSH, T4TOTAL, FREET4, T3FREE, THYROIDAB in the last 72 hours. Anemia Panel: No results for input(s): VITAMINB12, FOLATE, FERRITIN, TIBC, IRON, RETICCTPCT in the last 72 hours. Sepsis Labs: No results for input(s):  PROCALCITON, LATICACIDVEN in the last 168 hours.  Recent Results (from the past 240 hour(s))  MRSA PCR Screening     Status: None   Collection Time: 11/30/17  7:25 PM  Result Value Ref Range Status   MRSA by PCR NEGATIVE NEGATIVE Final    Comment:        The GeneXpert MRSA Assay (FDA approved for NASAL specimens only), is one component of a comprehensive MRSA colonization surveillance program. It is not intended to diagnose MRSA infection nor to guide or monitor treatment for MRSA infections. Performed at [facility], [place] [address] [place], [place] [address]   Culture, Urine     Status: None (Preliminary result)   Collection Time: 12/01/17 11:22 AM  Result Value Ref Range Status   Specimen Description   Final    URINE, CLEAN CATCH Performed at [facility], [place] [address] [place], [place] [address]    Special Requests   Final    NONE Performed at [facility], [place] [address] [place], [place] [address]    Culture   Final    CULTURE REINCUBATED FOR BETTER GROWTH Performed at [facility], [place] [address] [place], [place] [address]    Report Status PENDING  Incomplete         Radiology Studies: No results found.      Scheduled Meds: . enalaprilat  0.625 mg Intravenous Q6H  . enoxaparin (LOVENOX) injection  30 mg Subcutaneous Q24H  .  morphine injection  2 mg Intravenous Once  . senna-docusate  1 tablet Oral BID   Continuous Infusions: . sodium chloride Stopped (11/30/17 2205)  . cefTRIAXone (ROCEPHIN)  IV Stopped (12/01/17 1543)  . dextrose 5 % and 0.45% NaCl 150 mL/hr at 12/02/17 0631  . erythromycin 250 mg (12/02/17 0500)  . famotidine (PEPCID) IV Stopped (12/01/17 1139)  . insulin 1.5 Units/hr (12/02/17 0804)     LOS: 2 days    Time spent: 40 minutes    Irine Seal, MD [facility] Pager [phone]  If 7PM-7AM, please contact night-coverage [email or web address] Password  [email or web address] 12/02/2017, 9:07 AM

## 2017-12-02 NOTE — Progress Notes (Signed)
VAST RN to bedside to assess for PIV access. Pt vomiting profusely. Unit RN administered antiemetic before VAST RN assessed bilateral arms. Attempted IV access X2 unsuccessfully. Visible veins are small and tortuous.  During recent admission, pt had [place] used to place PIV's and she also had a PICC inserted; pulled before discharge. Advised unit RN pt might be candidate for PICC if her plan of care involves an extended stay or a lot of medications/fluids. Also advised RN to have night shift place another IVT consult if IV access is still needed, as night shift VAST RN should be able to use ultrasound.

## 2017-12-03 ENCOUNTER — Inpatient Hospital Stay: Payer: Self-pay

## 2017-12-03 ENCOUNTER — Inpatient Hospital Stay (HOSPITAL_COMMUNITY): Payer: Medicaid Other

## 2017-12-03 DIAGNOSIS — E876 Hypokalemia: Secondary | ICD-10-CM

## 2017-12-03 DIAGNOSIS — R030 Elevated blood-pressure reading, without diagnosis of hypertension: Secondary | ICD-10-CM | POA: Diagnosis present

## 2017-12-03 LAB — BASIC METABOLIC PANEL
ANION GAP: 9 (ref 5–15)
BUN: <5 mg/dL — ABNORMAL LOW (ref 6–20)
CHLORIDE: 109 mmol/L (ref 98–111)
CO2: 20 mmol/L — ABNORMAL LOW (ref 22–32)
Calcium: 8.3 mg/dL — ABNORMAL LOW (ref 8.9–10.3)
Creatinine, Ser: 0.62 mg/dL (ref 0.44–1.00)
GFR calc non Af Amer: >60 mL/min (ref >60)
Glucose, Bld: 156 mg/dL — ABNORMAL HIGH (ref 70–99)
Potassium: 3.2 mmol/L — ABNORMAL LOW (ref 3.5–5.1)
Sodium: 138 mmol/L (ref 135–145)

## 2017-12-03 LAB — GLUCOSE, CAPILLARY
GLUCOSE-CAPILLARY: 70 mg/dL (ref 70–99)
GLUCOSE-CAPILLARY: 139 mg/dL — AB (ref 70–99)
GLUCOSE-CAPILLARY: 165 mg/dL — AB (ref 70–99)
Glucose-Capillary: 55 mg/dL — ABNORMAL LOW (ref 70–99)
Glucose-Capillary: 76 mg/dL (ref 70–99)

## 2017-12-03 LAB — CBC
HEMATOCRIT: 34.4 % — AB (ref 36.0–46.0)
HEMOGLOBIN: 11.3 g/dL — AB (ref 12.0–15.0)
MCH: 28.6 pg (ref 26.0–34.0)
MCHC: 32.8 g/dL (ref 30.0–36.0)
MCV: 87.1 fL (ref 80.0–100.0)
NRBC: 0 % (ref 0.0–0.2)
Platelets: 353 10*3/uL (ref 150–400)
RBC: 3.95 MIL/uL (ref 3.87–5.11)
RDW: 13.8 % (ref 11.5–15.5)
WBC: 7.8 10*3/uL (ref 4.0–10.5)

## 2017-12-03 LAB — MAGNESIUM: Magnesium: 1.5 mg/dL — ABNORMAL LOW (ref 1.7–2.4)

## 2017-12-03 MED ORDER — MORPHINE SULFATE (PF) 2 MG/ML IV SOLN
2.0000 mg | Freq: Four times a day (QID) | INTRAVENOUS | Status: DC | PRN
  Administered 2017-12-03 – 2017-12-05 (×5): 2 mg via INTRAVENOUS
  Filled 2017-12-03 (×5): qty 1

## 2017-12-03 MED ORDER — POTASSIUM CHLORIDE 10 MEQ/100ML IV SOLN
10.0000 meq | INTRAVENOUS | Status: AC
  Administered 2017-12-03 (×5): 10 meq via INTRAVENOUS
  Filled 2017-12-03 (×5): qty 100

## 2017-12-03 MED ORDER — TAMSULOSIN HCL 0.4 MG PO CAPS
0.4000 mg | ORAL_CAPSULE | Freq: Every day | ORAL | Status: DC
  Administered 2017-12-03 – 2017-12-07 (×5): 0.4 mg via ORAL
  Filled 2017-12-03 (×4): qty 1

## 2017-12-03 MED ORDER — INSULIN GLARGINE 100 UNIT/ML ~~LOC~~ SOLN
15.0000 [IU] | Freq: Every day | SUBCUTANEOUS | Status: DC
  Administered 2017-12-03: 15 [IU] via SUBCUTANEOUS
  Filled 2017-12-03: qty 0.15

## 2017-12-03 MED ORDER — MAGNESIUM SULFATE 4 GM/100ML IV SOLN
4.0000 g | Freq: Once | INTRAVENOUS | Status: AC
  Administered 2017-12-03: 4 g via INTRAVENOUS
  Filled 2017-12-03: qty 100

## 2017-12-03 MED ORDER — SODIUM CHLORIDE 0.9 % IV SOLN
INTRAVENOUS | Status: DC
  Administered 2017-12-03 – 2017-12-05 (×4): via INTRAVENOUS

## 2017-12-03 MED ORDER — POTASSIUM CHLORIDE CRYS ER 20 MEQ PO TBCR: 40.0000 meq | EXTENDED_RELEASE_TABLET | ORAL | Status: DC

## 2017-12-03 NOTE — Progress Notes (Addendum)
PROGRESS NOTE    [name]  BJS:[id number] DOB: 1990-11-18 DOA: 11/29/2017 PCP: Vicenta Aly, FNP    Brief Narrative:  [name] is a 27 y.o. F with IDDM on pump, gastroparesis, known cannabis hyperemesis, and frequent admissions for N/V who presents with nausea and vomiting, not possible to control in the ER. Patient noted to go into DKA yesterday, 11/30/2017 with anion gap as high as 24, acidosis with a bicarb as low as 8, ketones noted in the urine on admission, with a blood glucose level of 442.  Patient noted with nausea and inability to keep anything down.  Patient placed on the insulin drip, IV fluids.  Patient also started on IV erythromycin for gastroparesis.    Assessment & Plan:   Principal Problem:   DKA (diabetic ketoacidoses) ([place]) Active Problems:   Nausea & vomiting   Uncontrolled type 1 diabetes mellitus ([facility])   Sinus tachycardia   Hypomagnesemia   Hypokalemia   Diabetic gastroparesis ([facility])   Protein-calorie malnutrition, severe   GERD (gastroesophageal reflux disease)   DKA, type 1 ([facility])   Tachycardia   Elevated blood pressure reading  #1 DKA/uncontrolled type 1 diabetes Questionable etiology.  Patient denies any respiratroy symptoms.  Patient states has been compliant with her medications and has not missed any doses.  Hemoglobin A1c 8.1 on 11/02/2017.  Urinalysis on admission with greater than 500 glucose, positive ketones, moderate leukocytes, nitrite negative, greater than 50 WBCs.  Patient noted on 11/30/2017 to have a anion gap is high as 24, acidosis with a bicarb as low as 8 with a blood glucose level of 442.  Repeat labs pending this morning.  Repeat urinalysis with large leukocytes, nitrite negative, greater than 50 WBCs.  Patient started empirically on IV Rocephin, pending urine cultures.  Anion gap closed.  Acidosis resolved.  Patient was transitioned to Lantus 30 units daily with sliding scale insulin.  Patient with ongoing emesis and  poor oral intake and as such we will decrease Lantus to 15 units daily.  Continue sliding scale.  Continue IV fluids.  Continue IV Reglan.  Supportive care.   2.  Intractable nausea and vomiting Presumed secondary to gastroparesis (positive gastric emptying study 2018) versus secondary to early DKA.  Also concern for conflicting reports of THC use raising question of cannabis hyperemesis.  Patient denies any recent THC use.  UDS was negative.  Patient noted to be persistently nauseated and unable to tolerate oral intake.  Patient was started on clear liquids still with nausea and emesis this morning.  Patient was started on erythromycin for gastroparesis now QT interval had normalized.  Repeat EKG with normalization of QT interval.  IV erythromycin discontinued patient on IV Reglan at 5 mg IV every 6 hours.  Check a KUB as patient with ongoing nausea and emesis.  Repeat soapsuds enema x1.  IV Compazine as needed for nausea.  Supportive care.  May need to increase IV Reglan to 10 mg.  Supportive care.   3.  Prolonged QT interval QTC was greater than 600 on admission.  QTC has normalized.  Repeat EKG with normalization of QTC.  Keep magnesium greater than 2.  Keep potassium greater than 4.   Follow.  4.  Sinus tachycardia Questionable etiology.  May be secondary to problem #1 with ongoing nausea and pain.  There was a concern for possible withdrawal syndrome however patient denies any polysubstance use.  Continue clonidine as needed.  Hydroxyzine as needed.  Bentyl as needed.  Treat #1.  IV fluids.  Supportive care.  5.  Gastroesophageal reflux disease Continue IV Pepcid.    6.  Anemia of chronic disease No overt bleeding.  H&H stable.  7.??  Constipation Patient received a soapsuds enema yesterday with no results.  Check a KUB.  Repeat soapsuds enema x1 today.  Follow.    8. diffuse pain Patient with complaints of diffuse pain all over coming from her abdomen and spreading all over.  Patient  states Toradol is not helping the pain and requesting morphine or Dilaudid which she states has helped before during her prior hospitalizations.  Patient currently on Toradol as needed.  Repeat EKG with normalization of QT.  IV erythromycin has been discontinued.  Continue IV Reglan at 5 mg every 6 hours may need to increase the dose if ongoing nausea and emesis.  Placed on morphine 2 mg IV every 6 hours as needed pain.  Soapsuds enema x1.  Follow.  9.  Hypokalemia/hypomagnesemia Likely secondary to GI losses from nausea and emesis.  Patient with ongoing nausea and emesis and as such we will give KCl 10 mEq IV runs x5.  Magnesium was 1.5 this morning.  Magnesium 4 g IV x1.  10.  Elevated blood pressure Patient with elevated blood pressure likely secondary to acute pain.  Patient states was on small dose of lisinopril for renal protection.  Continue IV enalapril.  Follow.  11.  UTI Urine cultures pending.  Continue IV Rocephin.   DVT prophylaxis: Lovenox Code Status: Full Family Communication: Updated patient.  No family at bedside. Disposition Plan: Remain in stepdown unit today.   Consultants:   None  Procedures:   Acute abdominal series 11/29/2017  KUB pending 12/03/2017  Antimicrobials:   IV Rocephin 12/01/2017   Subjective: Patient still with nausea and emesis overnight.  Patient vomiting excessively this morning during my interview.  Patient states she feels she is not emptying out her bladder.  Patient still complaining of diffuse pain and asking whether she can have a dose of morphine to help with her pain.  No bowel movement after soapsuds enema on 12/02/2017.  Patient with very minimal oral intake over the past several days.  Objective: Vitals:   12/03/17 0600 12/03/17 0700 12/03/17 0800 12/03/17 0900  BP: 112/71 112/71 114/75   Pulse: (!) 104 (!) 108 (!) 105 (!) 104  Resp: 20 (!) 23 20 18   Temp:   99 F (37.2 C)   TempSrc:   Oral   SpO2: 100% 99% 100% 98%    Weight:        Intake/Output Summary (Last 24 hours) at 12/03/2017 0958 Last data filed at 12/03/2017 0800 Gross per 24 hour  Intake 4413.66 ml  Output 250 ml  Net 4163.66 ml   Filed Weights   12/01/17 0916 12/03/17 0500  Weight: 57.2 kg 56.7 kg    Examination:  General exam: NAD Respiratory system: CTAB.  No wheezes, no crackles, no rhonchi. Respiratory effort normal. Cardiovascular system: Tachycardic.  No murmurs, rubs or gallops.  No JVD.  No lower extremity edema. Gastrointestinal system: Abdomen is soft, lower abdomen is distended, positive bowel sounds, some diffuse tenderness to palpation.  No hepatosplenomegaly.   Central nervous system: Alert and oriented. No focal neurological deficits. Extremities: Symmetric 5 x 5 power. Skin: No rashes, lesions or ulcers Psychiatry: Judgement and insight appear normal. Mood & affect appropriate.     Data Reviewed: I have personally reviewed following labs and imaging studies  CBC: Recent Labs  Lab  11/29/17 0153  11/29/17 1203 11/30/17 0634 12/01/17 0522 12/02/17 0301 12/03/17 0336  WBC 14.1*  --  8.0 7.9 12.1* 9.6 7.8  NEUTROABS 13.2*  --  6.1  --   --   --   --   HGB 13.1   < > 10.4* 11.0* 10.6* 10.5* 11.3*  HCT 41.7   < > 33.4* 34.4* 34.0* 31.9* 34.4*  MCV 89.7  --  90.5 89.6 91.6 86.9 87.1  PLT 456*  --  366 PLATELET CLUMPS NOTED ON SMEAR, UNABLE TO ESTIMATE 375 329 353   < > = values in this interval not displayed.   Basic Metabolic Panel: Recent Labs  Lab 12/02/17 0134 12/02/17 0920 12/02/17 1359 12/02/17 1756 12/03/17 0336  NA 137 136 139 138 138  K 3.6 3.5 3.0* 2.8* 3.2*  CL 111 106 109 110 109  CO2 16* 16* 21* 21* 20*  GLUCOSE 147* 245* 163* 130* 156*  BUN 8 6 5* <5* <5*  CREATININE 0.72 0.67 0.63 0.57 0.62  CALCIUM 8.5* 8.6* 8.2* 8.1* 8.3*  MG  --   --   --   --  1.5*   GFR: Estimated Creatinine Clearance: 86.6 mL/min (by C-G formula based on SCr of 0.62 mg/dL). Liver Function Tests: Recent  Labs  Lab 11/29/17 0153 11/29/17 1203  AST 16 13*  ALT 14 12  ALKPHOS 80 58  BILITOT 0.7 0.6  PROT 7.9 5.9*  ALBUMIN 4.4 3.3*   Recent Labs  Lab 11/29/17 0153  LIPASE 16   No results for input(s): AMMONIA in the last 168 hours. Coagulation Profile: No results for input(s): INR, PROTIME in the last 168 hours. Cardiac Enzymes: No results for input(s): CKTOTAL, CKMB, CKMBINDEX, TROPONINI in the last 168 hours. BNP (last 3 results) No results for input(s): PROBNP in the last 8760 hours. HbA1C: No results for input(s): HGBA1C in the last 72 hours. CBG: Recent Labs  Lab 12/02/17 1951 12/02/17 2106 12/02/17 2232 12/02/17 2337 12/03/17 0823  GLUCAP 216* 210* 164* 131* 70   Lipid Profile: No results for input(s): CHOL, HDL, LDLCALC, TRIG, CHOLHDL, LDLDIRECT in the last 72 hours. Thyroid Function Tests: No results for input(s): TSH, T4TOTAL, FREET4, T3FREE, THYROIDAB in the last 72 hours. Anemia Panel: No results for input(s): VITAMINB12, FOLATE, FERRITIN, TIBC, IRON, RETICCTPCT in the last 72 hours. Sepsis Labs: No results for input(s): PROCALCITON, LATICACIDVEN in the last 168 hours.  Recent Results (from the past 240 hour(s))  MRSA PCR Screening     Status: None   Collection Time: 11/30/17  7:25 PM  Result Value Ref Range Status   MRSA by PCR NEGATIVE NEGATIVE Final    Comment:        The GeneXpert MRSA Assay (FDA approved for NASAL specimens only), is one component of a comprehensive MRSA colonization surveillance program. It is not intended to diagnose MRSA infection nor to guide or monitor treatment for MRSA infections. Performed at [facility], [place] [address], [place], [place] [address]   Culture, Urine     Status: Abnormal (Preliminary result)   Collection Time: 12/01/17 11:22 AM  Result Value Ref Range Status   Specimen Description   Final    URINE, CLEAN CATCH Performed at [facility], [place] [address],  [place], [place] [address]    Special Requests   Final    NONE Performed at [facility], [place] [address], [place], [place] [address]    Culture (A)  Final    >=  100,000 COLONIES/mL UNIDENTIFIED ORGANISM CULTURE REINCUBATED FOR BETTER GROWTH Performed at [facility], [place] [address], [place], [place] [address]    Report Status PENDING  Incomplete         Radiology Studies: No results found.      Scheduled Meds: . chlorhexidine  15 mL Mouth Rinse BID  . enalaprilat  0.625 mg Intravenous Q6H  . enoxaparin (LOVENOX) injection  30 mg Subcutaneous Q24H  . insulin aspart  0-5 Units Subcutaneous QHS  . insulin aspart  0-9 Units Subcutaneous TID WC  . insulin glargine  30 Units Subcutaneous QHS  . mouth rinse  15 mL Mouth Rinse q12n4p  . metoCLOPramide (REGLAN) injection  5 mg Intravenous Q6H  . senna-docusate  1 tablet Oral BID   Continuous Infusions: . sodium chloride    . cefTRIAXone (ROCEPHIN)  IV Stopped (12/02/17 1626)  . famotidine (PEPCID) IV Stopped (12/01/17 1139)  . potassium chloride       LOS: 3 days    Time spent: 40 minutes    Irine Seal, MD [facility] Pager [phone]  If 7PM-7AM, please contact night-coverage [email or web address] Password [email or web address] 12/03/2017, 9:58 AM

## 2017-12-03 NOTE — Progress Notes (Signed)
Assessed for PICC insertion unsuccessful.  Both arms assess and unable to find a suitable vein.  No cephalic vein found in either arm.  Brachial and basilic veins in both arms measure over 60% occupancy - too small for PICC insertion. If PICC still desired may consider referral to IR.

## 2017-12-03 NOTE — Progress Notes (Signed)
Hypoglycemic Event  CBG: 55  Treatment: apple juice  Symptoms: lethargy   Follow-up CBG: Time: 12:50 CBG Result: 76  Possible Reasons for Event: Type 1 Diabetic    Comments/MD notified: Will continue to monitor and encourage oral intake. Holding novolog at this time.      Carver Fila, RN

## 2017-12-03 NOTE — Progress Notes (Signed)
Inpatient Diabetes Program Recommendations  AACE/ADA: New Consensus Statement on Inpatient Glycemic Control (2015)  Target Ranges:  Prepandial:   less than 140 mg/dL      Peak postprandial:   less than 180 mg/dL (1-2 hours)      Critically ill patients:  140 - 180 mg/dL   Lab Results  Component Value Date   GLUCAP 76 12/03/2017   HGBA1C 8.1 (H) 11/02/2017    Review of Glycemic Control  Hypoglycemia this am. Continues with poor po intake.  Inpatient Diabetes Program Recommendations:     Lantus 20 units QHS  Will continue to follow.  Thank you. Lorenda Peck, RD, LDN, CDE Inpatient Diabetes Coordinator [phone]

## 2017-12-04 DIAGNOSIS — R338 Other retention of urine: Secondary | ICD-10-CM

## 2017-12-04 LAB — BASIC METABOLIC PANEL
Anion gap: 4 — ABNORMAL LOW (ref 5–15)
CALCIUM: 8.1 mg/dL — AB (ref 8.9–10.3)
CO2: 25 mmol/L (ref 22–32)
CREATININE: 0.47 mg/dL (ref 0.44–1.00)
Chloride: 111 mmol/L (ref 98–111)
GFR calc Af Amer: >60 mL/min (ref >60)
GFR calc non Af Amer: >60 mL/min (ref >60)
Glucose, Bld: 104 mg/dL — ABNORMAL HIGH (ref 70–99)
Potassium: 3.2 mmol/L — ABNORMAL LOW (ref 3.5–5.1)
Sodium: 140 mmol/L (ref 135–145)

## 2017-12-04 LAB — URINE CULTURE: Culture: >=100000 — AB

## 2017-12-04 LAB — MAGNESIUM: MAGNESIUM: 2 mg/dL (ref 1.7–2.4)

## 2017-12-04 LAB — GLUCOSE, CAPILLARY
GLUCOSE-CAPILLARY: 147 mg/dL — AB (ref 70–99)
Glucose-Capillary: 50 mg/dL — ABNORMAL LOW (ref 70–99)
Glucose-Capillary: 192 mg/dL — ABNORMAL HIGH (ref 70–99)
Glucose-Capillary: 172 mg/dL — ABNORMAL HIGH (ref 70–99)

## 2017-12-04 MED ORDER — POTASSIUM CHLORIDE CRYS ER 20 MEQ PO TBCR: 40.0000 meq | EXTENDED_RELEASE_TABLET | ORAL | Status: DC

## 2017-12-04 MED ORDER — POTASSIUM CHLORIDE CRYS ER 20 MEQ PO TBCR
40.0000 meq | EXTENDED_RELEASE_TABLET | Freq: Once | ORAL | Status: AC
  Administered 2017-12-04: 40 meq via ORAL
  Filled 2017-12-04: qty 2

## 2017-12-04 MED ORDER — CIPROFLOXACIN HCL 500 MG PO TABS
500.0000 mg | ORAL_TABLET | Freq: Two times a day (BID) | ORAL | Status: DC
  Administered 2017-12-04 – 2017-12-07 (×7): 500 mg via ORAL
  Filled 2017-12-04 (×7): qty 1

## 2017-12-04 MED ORDER — INSULIN GLARGINE 100 UNIT/ML ~~LOC~~ SOLN
10.0000 [IU] | Freq: Every day | SUBCUTANEOUS | Status: DC
  Administered 2017-12-04: 10 [IU] via SUBCUTANEOUS
  Filled 2017-12-04: qty 0.1

## 2017-12-04 MED ORDER — METOCLOPRAMIDE HCL 5 MG/ML IJ SOLN
10.0000 mg | Freq: Four times a day (QID) | INTRAMUSCULAR | Status: DC
  Administered 2017-12-04 – 2017-12-07 (×12): 10 mg via INTRAVENOUS
  Filled 2017-12-04 (×12): qty 2

## 2017-12-04 NOTE — Progress Notes (Signed)
PROGRESS NOTE    [name]  UKG:[id number] DOB: 31-May-1990 DOA: 11/29/2017 PCP: Vicenta Aly, FNP    Brief Narrative:  [name] is a 27 y.o. F with IDDM on pump, gastroparesis, known cannabis hyperemesis, and frequent admissions for N/V who presents with nausea and vomiting, not possible to control in the ER. Patient noted to go into DKA yesterday, 11/30/2017 with anion gap as high as 24, acidosis with a bicarb as low as 8, ketones noted in the urine on admission, with a blood glucose level of 442.  Patient noted with nausea and inability to keep anything down.  Patient placed on the insulin drip, IV fluids.  Patient also started on IV erythromycin for gastroparesis.    Assessment & Plan:   Principal Problem:   DKA (diabetic ketoacidoses) ([place]) Active Problems:   Nausea & vomiting   Uncontrolled type 1 diabetes mellitus (HCC)   Sinus tachycardia   Hypomagnesemia   Hypokalemia   Diabetic gastroparesis (HCC)   Protein-calorie malnutrition, severe   GERD (gastroesophageal reflux disease)   Acute urinary retention   DKA, type 1 (HCC)   Tachycardia   Elevated blood pressure reading  #1 DKA/uncontrolled type 1 diabetes Questionable etiology.  Patient denies any respiratroy symptoms.  Patient states has been compliant with her medications and has not missed any doses.  Hemoglobin A1c 8.1 on 11/02/2017.  Urinalysis on admission with greater than 500 glucose, positive ketones, moderate leukocytes, nitrite negative, greater than 50 WBCs.  Patient noted on 11/30/2017 to have a anion gap is high as 24, acidosis with a bicarb as low as 8 with a blood glucose level of 442.  Repeat urinalysis with large leukocytes, nitrite negative, greater than 50 WBCs.  Patient started empirically on IV Rocephin, with urine cultures now positive for staph epidermidis.  Anion gap closed.  Acidosis resolved.  Patient was transitioned to Lantus 30 units daily with sliding scale insulin.  Patient  with ongoing emesis and poor oral intake and as such Lantus was decreased to 15 units daily.  CBG this morning was 50 and as such we will decrease Lantus to 10 units daily.  Continue sliding scale insulin.  Patient started on a carb modified diet.  Increase IV Reglan to 10 mg every 6 hours.  Supportive care.    2.  Intractable nausea and vomiting Presumed secondary to gastroparesis (positive gastric emptying study 2018) versus secondary to early DKA.  Also concern for conflicting reports of THC use raising question of cannabis hyperemesis.  Patient denies any recent THC use.  UDS was negative.  Patient noted to be persistently nauseated and unable to tolerate oral intake.  Patient was started on clear liquids still with nausea and emesis this morning.  Patient was started on erythromycin for gastroparesis now QT interval had normalized.  Repeat EKG with normalization of QT interval.  IV erythromycin discontinued patient on IV Reglan at 5 mg IV every 6 hours.  KUB unremarkable.  Patient given soapsuds enemas x2 with no bowel movement.  Some clinical improvement.  Increase Reglan back to home dose of 10 mg IV every 6 hours.  IV Compazine as needed for nausea.  Supportive care.   3.  Prolonged QT interval QTC was greater than 600 on admission.  QTC has normalized.  Repeat EKG with normalization of QTC.  Keep magnesium greater than 2.  Keep potassium greater than 4.   Follow.  4.  Sinus tachycardia Questionable etiology.  May be secondary to problem #1 with ongoing  nausea and pain.  There was a concern for possible withdrawal syndrome however patient denies any polysubstance use.  Continue clonidine as needed.  Hydroxyzine as needed.  Bentyl as needed.  Treat #1.  IV fluids.  Supportive care.  5.  Gastroesophageal reflux disease Continue IV Pepcid.    6.  Anemia of chronic disease No overt bleeding.  H&H stable.  7.??  Constipation Patient received a soapsuds enema yesterday with no results.  KUB  unremarkable.  Patient with no bowel movement after soapsuds enemas x2.  Patient with no significant oral intake over the past 48 to 72 hours.  On IV Reglan.  Follow for now.    8. diffuse pain Patient with complaints of diffuse pain all over coming from her abdomen and spreading all over.  Patient states Toradol is not helping the pain and requesting morphine or Dilaudid which she states has helped before during her prior hospitalizations.  Patient currently on Toradol as needed.  Repeat EKG with normalization of QT.  IV erythromycin has been discontinued.  Continue IV Reglan at 5 mg every 6 hours may need to increase the dose if ongoing nausea and emesis.  Continue morphine 2 mg IV every 6 hours as needed pain.  Soapsuds enema x1.  Follow.  9.  Hypokalemia/hypomagnesemia Likely secondary to GI losses from nausea and emesis.  Patient with ongoing nausea and emesis.  Replete potassium.  Magnesium at 2.0.  Follow.  10.  Elevated blood pressure Patient with elevated blood pressure likely secondary to acute pain.  Patient states was on small dose of lisinopril for renal protection.  Continue IV enalapril.  Once oral intake improves will transition back to home regimen of oral lisinopril follow.  11.  Staph epidermidis UTI Urine cultures positive for staph epidermidis and sensitive to ciprofloxacin, tetracycline, Bactrim, Macrobid.  Resistant to oxacillin.  Will discontinue IV Rocephin and place on oral ciprofloxacin.  12.  Urinary retention Patient noted to have urinary retention yesterday and Foley catheter placed.  Patient started on Flomax.  Will likely need outpatient follow-up with urology.   DVT prophylaxis: Lovenox Code Status: Full Family Communication: Updated patient.  No family at bedside. Disposition Plan: Transfer to [place].    Consultants:   None  Procedures:   Acute abdominal series 11/29/2017  KUB 12/03/2017  Antimicrobials:   IV Rocephin 12/01/2017>>>>>  12/04/2017  Oral ciprofloxacin 12/04/2017   Subjective: Patient laying in bed states she feels better than she did yesterday.  Patient still with some nausea however no emesis.  Patient states ate some breakfast this morning and had some nausea and some diffuse abdominal pain requiring pain medication.  Patient now with Foley catheter due to urinary retention.  Patient noted to have a CBG of 50 this morning.  Patient with poor oral intake.  Objective: Vitals:   12/04/17 0500 12/04/17 0600 12/04/17 0700 12/04/17 0800  BP:  118/78    Pulse: 89 86 93   Resp: 18 12 15    Temp:    98.2 F (36.8 C)  TempSrc:    Oral  SpO2: 99% 99% 100%   Weight: 55.9 kg       Intake/Output Summary (Last 24 hours) at 12/04/2017 0917 Last data filed at 12/04/2017 0700 Gross per 24 hour  Intake 2739.35 ml  Output 5400 ml  Net -2660.65 ml   Filed Weights   12/01/17 0916 12/03/17 0500 12/04/17 0500  Weight: 57.2 kg 56.7 kg 55.9 kg    Examination:  General exam: NAD  Respiratory system: Lungs clear to auscultation bilaterally.  No wheezes, no crackles, no rhonchi.  Normal respiratory effort.  CTAB. Cardiovascular system: Tachycardia.  No murmurs rubs or gallops.  No JVD.  No lower extremity edema.   Gastrointestinal system: Abdomen is soft, nondistended, positive bowel sounds, nontender to palpation.  No hepatosplenomegaly.  Central nervous system: Alert and oriented. No focal neurological deficits. Extremities: Symmetric 5 x 5 power. Skin: No rashes, lesions or ulcers Psychiatry: Judgement and insight appear normal. Mood & affect appropriate.     Data Reviewed: I have personally reviewed following labs and imaging studies  CBC: Recent Labs  Lab 11/29/17 0153  11/29/17 1203 11/30/17 0634 12/01/17 0522 12/02/17 0301 12/03/17 0336  WBC 14.1*  --  8.0 7.9 12.1* 9.6 7.8  NEUTROABS 13.2*  --  6.1  --   --   --   --   HGB 13.1   < > 10.4* 11.0* 10.6* 10.5* 11.3*  HCT 41.7   < > 33.4* 34.4*  34.0* 31.9* 34.4*  MCV 89.7  --  90.5 89.6 91.6 86.9 87.1  PLT 456*  --  366 PLATELET CLUMPS NOTED ON SMEAR, UNABLE TO ESTIMATE 375 329 353   < > = values in this interval not displayed.   Basic Metabolic Panel: Recent Labs  Lab 12/02/17 0920 12/02/17 1359 12/02/17 1756 12/03/17 0336 12/04/17 0241  NA 136 139 138 138 140  K 3.5 3.0* 2.8* 3.2* 3.2*  CL 106 109 110 109 111  CO2 16* 21* 21* 20* 25  GLUCOSE 245* 163* 130* 156* 104*  BUN 6 5* <5* <5* <5*  CREATININE 0.67 0.63 0.57 0.62 0.47  CALCIUM 8.6* 8.2* 8.1* 8.3* 8.1*  MG  --   --   --  1.5* 2.0   GFR: Estimated Creatinine Clearance: 86.6 mL/min (by C-G formula based on SCr of 0.47 mg/dL). Liver Function Tests: Recent Labs  Lab 11/29/17 0153 11/29/17 1203  AST 16 13*  ALT 14 12  ALKPHOS 80 58  BILITOT 0.7 0.6  PROT 7.9 5.9*  ALBUMIN 4.4 3.3*   Recent Labs  Lab 11/29/17 0153  LIPASE 16   No results for input(s): AMMONIA in the last 168 hours. Coagulation Profile: No results for input(s): INR, PROTIME in the last 168 hours. Cardiac Enzymes: No results for input(s): CKTOTAL, CKMB, CKMBINDEX, TROPONINI in the last 168 hours. BNP (last 3 results) No results for input(s): PROBNP in the last 8760 hours. HbA1C: No results for input(s): HGBA1C in the last 72 hours. CBG: Recent Labs  Lab 12/03/17 1221 12/03/17 1259 12/03/17 1643 12/03/17 2127 12/04/17 0726  GLUCAP 55* 76 139* 165* 50*   Lipid Profile: No results for input(s): CHOL, HDL, LDLCALC, TRIG, CHOLHDL, LDLDIRECT in the last 72 hours. Thyroid Function Tests: No results for input(s): TSH, T4TOTAL, FREET4, T3FREE, THYROIDAB in the last 72 hours. Anemia Panel: No results for input(s): VITAMINB12, FOLATE, FERRITIN, TIBC, IRON, RETICCTPCT in the last 72 hours. Sepsis Labs: No results for input(s): PROCALCITON, LATICACIDVEN in the last 168 hours.  Recent Results (from the past 240 hour(s))  MRSA PCR Screening     Status: None   Collection Time: 11/30/17   7:25 PM  Result Value Ref Range Status   MRSA by PCR NEGATIVE NEGATIVE Final    Comment:        The GeneXpert MRSA Assay (FDA approved for NASAL specimens only), is one component of a comprehensive MRSA colonization surveillance program. It is not intended to diagnose MRSA infection  nor to guide or monitor treatment for MRSA infections. Performed at [facility], [place] [address], [place], [place] [address]   Culture, Urine     Status: Abnormal   Collection Time: 12/01/17 11:22 AM  Result Value Ref Range Status   Specimen Description   Final    URINE, CLEAN CATCH Performed at [facility], [place] [address] [place], [place] [address]    Special Requests   Final    NONE Performed at [facility], [place] [address] [place], [place] [address]    Culture >=100,000 COLONIES/mL STAPHYLOCOCCUS EPIDERMIDIS (A)  Final   Report Status 12/04/2017 FINAL  Final   Organism ID, Bacteria STAPHYLOCOCCUS EPIDERMIDIS (A)  Final      Susceptibility   Staphylococcus epidermidis - MIC*    CIPROFLOXACIN <=0.5 SENSITIVE Sensitive     GENTAMICIN <=0.5 SENSITIVE Sensitive     NITROFURANTOIN <=16 SENSITIVE Sensitive     OXACILLIN >=4 RESISTANT Resistant     TETRACYCLINE <=1 SENSITIVE Sensitive     VANCOMYCIN 1 SENSITIVE Sensitive     TRIMETH/SULFA <=10 SENSITIVE Sensitive     CLINDAMYCIN RESISTANT Resistant     RIFAMPIN <=0.5 SENSITIVE Sensitive     Inducible Clindamycin POSITIVE Resistant     * >=100,000 COLONIES/mL STAPHYLOCOCCUS EPIDERMIDIS         Radiology Studies: Dg Abd 2 Views  Result Date: 12/03/2017 CLINICAL DATA:  Emesis EXAM: ABDOMEN - 2 VIEW COMPARISON:  11/29/2017 FINDINGS: Visualized lung bases clear. No free air. Normal bowel gas pattern.  Cholecystectomy clips. Stable small left pelvic phlebolith. Regional bones unremarkable. IMPRESSION: Negative. Electronically Signed   By: [name] M.D.   On: 12/03/2017 15:20   [place] Ekg  Site Rite  Result Date: 12/03/2017 If Site Rite image not attached, placement could not be confirmed due to current cardiac rhythm.       Scheduled Meds: . chlorhexidine  15 mL Mouth Rinse BID  . ciprofloxacin  500 mg Oral BID  . enalaprilat  0.625 mg Intravenous Q6H  . enoxaparin (LOVENOX) injection  30 mg Subcutaneous Q24H  . insulin aspart  0-5 Units Subcutaneous QHS  . insulin aspart  0-9 Units Subcutaneous TID WC  . insulin glargine  15 Units Subcutaneous QHS  . mouth rinse  15 mL Mouth Rinse q12n4p  . metoCLOPramide (REGLAN) injection  5 mg Intravenous Q6H  . potassium chloride  40 mEq Oral Once  . senna-docusate  1 tablet Oral BID  . tamsulosin  0.4 mg Oral QPC breakfast   Continuous Infusions: . sodium chloride 75 mL/hr at 12/04/17 0757  . famotidine (PEPCID) IV Stopped (12/03/17 1435)     LOS: 4 days    Time spent: 40 minutes    Irine Seal, MD [facility] Pager [phone]  If 7PM-7AM, please contact night-coverage [email or web address] Password [email or web address] 12/04/2017, 9:17 AM

## 2017-12-04 NOTE — Progress Notes (Signed)
Tried to call report on patient to the floor, for [phone].  RN on the floor unable to take report and to call back for report.  Fama Muenchow Roselie Awkward RN

## 2017-12-04 NOTE — Progress Notes (Signed)
A fellow RN walked by the patient's room and noticed the patient with several fingers down her throat trying to make herself vomit.  Gave patient some Compazine for nausea and instructed patient to please not make herself vomit.  I have medication for nausea she does not need to make herself vomit.  Finas Delone Roselie Awkward RN

## 2017-12-05 LAB — BASIC METABOLIC PANEL
ANION GAP: 8 (ref 5–15)
BUN: 10 mg/dL (ref 6–20)
CALCIUM: 8.4 mg/dL — AB (ref 8.9–10.3)
CO2: 27 mmol/L (ref 22–32)
Chloride: 101 mmol/L (ref 98–111)
Creatinine, Ser: 0.7 mg/dL (ref 0.44–1.00)
GFR calc Af Amer: >60 mL/min (ref >60)
GFR calc non Af Amer: >60 mL/min (ref >60)
Glucose, Bld: 343 mg/dL — ABNORMAL HIGH (ref 70–99)
POTASSIUM: 4.2 mmol/L (ref 3.5–5.1)
Sodium: 136 mmol/L (ref 135–145)

## 2017-12-05 LAB — GLUCOSE, CAPILLARY
GLUCOSE-CAPILLARY: 426 mg/dL — AB (ref 70–99)
GLUCOSE-CAPILLARY: 325 mg/dL — AB (ref 70–99)
Glucose-Capillary: 304 mg/dL — ABNORMAL HIGH (ref 70–99)
Glucose-Capillary: 271 mg/dL — ABNORMAL HIGH (ref 70–99)
Glucose-Capillary: 325 mg/dL — ABNORMAL HIGH (ref 70–99)

## 2017-12-05 LAB — CBC
HEMATOCRIT: 32.6 % — AB (ref 36.0–46.0)
Hemoglobin: 10.5 g/dL — ABNORMAL LOW (ref 12.0–15.0)
MCH: 28.8 pg (ref 26.0–34.0)
MCHC: 32.2 g/dL (ref 30.0–36.0)
MCV: 89.3 fL (ref 80.0–100.0)
PLATELETS: 287 10*3/uL (ref 150–400)
RBC: 3.65 MIL/uL — AB (ref 3.87–5.11)
RDW: 13.5 % (ref 11.5–15.5)
WBC: 6.6 10*3/uL (ref 4.0–10.5)
nRBC: 0 % (ref 0.0–0.2)

## 2017-12-05 LAB — MAGNESIUM: Magnesium: 1.7 mg/dL (ref 1.7–2.4)

## 2017-12-05 MED ORDER — INSULIN GLARGINE 100 UNIT/ML ~~LOC~~ SOLN
30.0000 [IU] | Freq: Every day | SUBCUTANEOUS | Status: DC
  Administered 2017-12-05 – 2017-12-06 (×2): 30 [IU] via SUBCUTANEOUS
  Filled 2017-12-05 (×3): qty 0.3

## 2017-12-05 MED ORDER — MORPHINE SULFATE (PF) 2 MG/ML IV SOLN
2.0000 mg | Freq: Three times a day (TID) | INTRAVENOUS | Status: DC | PRN
  Administered 2017-12-05 – 2017-12-07 (×5): 2 mg via INTRAVENOUS
  Filled 2017-12-05 (×5): qty 1

## 2017-12-05 MED ORDER — INSULIN GLARGINE 100 UNIT/ML ~~LOC~~ SOLN
15.0000 [IU] | Freq: Every day | SUBCUTANEOUS | Status: DC
  Filled 2017-12-05: qty 0.15

## 2017-12-05 MED ORDER — INSULIN ASPART 100 UNIT/ML ~~LOC~~ SOLN
11.0000 [IU] | Freq: Once | SUBCUTANEOUS | Status: AC
  Administered 2017-12-05: 11 [IU] via SUBCUTANEOUS

## 2017-12-05 MED ORDER — LACTULOSE 10 GM/15ML PO SOLN
30.0000 g | Freq: Three times a day (TID) | ORAL | Status: DC
  Administered 2017-12-05 (×2): 30 g via ORAL
  Filled 2017-12-05 (×2): qty 45

## 2017-12-05 MED ORDER — MAGNESIUM SULFATE 4 GM/100ML IV SOLN
4.0000 g | Freq: Once | INTRAVENOUS | Status: AC
  Administered 2017-12-05: 4 g via INTRAVENOUS
  Filled 2017-12-05: qty 100

## 2017-12-05 MED ORDER — SODIUM CHLORIDE 0.9 % IV BOLUS
500.0000 mL | Freq: Once | INTRAVENOUS | Status: AC
  Administered 2017-12-05: 500 mL via INTRAVENOUS

## 2017-12-05 MED ORDER — LISINOPRIL 5 MG PO TABS: 5.0000 mg | ORAL_TABLET | Freq: Every day | ORAL | Status: DC

## 2017-12-05 NOTE — Progress Notes (Signed)
Pt BP low, On Call MD paged, 500 mL bolus given, BP remains low. On Call MD aware again. Pt is asymptomatic. Will continue to monitor.  Pt is also experiencing frequent loose stools. Lactulose and Senokot refused by pt this evening.    Kizzie Ide, RN

## 2017-12-05 NOTE — Progress Notes (Addendum)
PROGRESS NOTE    [name]  DUK:[id number] DOB: 12-Feb-1990 DOA: 11/29/2017 PCP: Vicenta Aly, FNP    Brief Narrative:  [name] is a 27 y.o. F with IDDM on pump, gastroparesis, known cannabis hyperemesis, and frequent admissions for N/V who presents with nausea and vomiting, not possible to control in the ER. Patient noted to go into DKA yesterday, 11/30/2017 with anion gap as high as 24, acidosis with a bicarb as low as 8, ketones noted in the urine on admission, with a blood glucose level of 442.  Patient noted with nausea and inability to keep anything down.  Patient placed on the insulin drip, IV fluids.  Patient also started on IV erythromycin for gastroparesis.    Assessment & Plan:   Principal Problem:   DKA (diabetic ketoacidoses) ([place]) Active Problems:   Nausea & vomiting   Uncontrolled type 1 diabetes mellitus (HCC)   Sinus tachycardia   Hypomagnesemia   Hypokalemia   Diabetic gastroparesis (HCC)   Protein-calorie malnutrition, severe   GERD (gastroesophageal reflux disease)   Acute urinary retention   DKA, type 1 (HCC)   Tachycardia   Elevated blood pressure reading  #1 DKA/uncontrolled type 1 diabetes Questionable etiology.  Patient denies any respiratroy symptoms.  Patient states has been compliant with her medications and has not missed any doses.  Hemoglobin A1c 8.1 on 11/02/2017.  Urinalysis on admission with greater than 500 glucose, positive ketones, moderate leukocytes, nitrite negative, greater than 50 WBCs.  Patient noted on 11/30/2017 to have a anion gap is high as 24, acidosis with a bicarb as low as 8 with a blood glucose level of 442.  Repeat urinalysis with large leukocytes, nitrite negative, greater than 50 WBCs.  Patient started empirically on IV Rocephin, with urine cultures now positive for staph epidermidis.  Anion gap closed.  Acidosis resolved.  Patient was transitioned to Lantus 30 units daily with sliding scale insulin.  Patient  with ongoing emesis and poor oral intake and as such Lantus was decreased to 10 units daily.  CBG this morning was 304 and as such we will increase Lantus back to 30 units daily as patient with increased oral intake.  Continue sliding scale insulin.  Continue IV Reglan 10 mg every 6 hours and if continued oral improvement with no further nausea or emesis will transition back to home dose oral Reglan tomorrow.  Supportive care.    2.  Intractable nausea and vomiting Presumed secondary to gastroparesis (positive gastric emptying study 2018) versus secondary to early DKA.  Also concern for conflicting reports of THC use raising question of cannabis hyperemesis.  Patient denies any recent THC use.  UDS was negative.  Patient noted to be persistently nauseated and unable to tolerate oral intake.  Patient was started on clear liquids still with nausea and emesis this morning.  Patient was started on erythromycin for gastroparesis now QT interval had normalized.  Repeat EKG with normalization of QT interval.  IV erythromycin discontinued patient on IV Reglan at 5 mg IV every 6 hours.  KUB unremarkable.  Patient given soapsuds enemas x2 with no bowel movement.  Patient improving clinically and now tolerating oral intake with no further emesis.  Reglan was increased to 10 mg IV every 6 hours.  If continued clinical improvement could likely transition back to home dose of oral Reglan. Supportive care.   3.  Prolonged QT interval QTC was greater than 600 on admission.  QTC has normalized.  Repeat EKG with normalization of QTC.  Keep magnesium greater than 2.  Keep potassium greater than 4.   Follow.  4.  Sinus tachycardia Questionable etiology.  May be secondary to problem #1 with ongoing nausea and pain.  There was a concern for possible withdrawal syndrome however patient denies any polysubstance use.  Continue clonidine as needed.  Hydroxyzine as needed.  Bentyl as needed.  Treat #1.  IV fluids.  Supportive  care.  5.  Gastroesophageal reflux disease Continue IV Pepcid.    6.  Anemia of chronic disease No overt bleeding.  H&H stable.  7.??  Constipation Patient received a soapsuds enema yesterday with no results.  KUB unremarkable.  Patient with no bowel movement after soapsuds enemas x2.  Patient with no significant oral intake over the past 48 to 72 hours.  Continue IV Reglan.  Follow.   8. diffuse pain Patient with complaints of diffuse pain all over coming from her abdomen and spreading all over.  Patient states Toradol is not helping the pain and requesting morphine or Dilaudid which she states has helped before during her prior hospitalizations.  Patient currently on Toradol as needed.  Repeat EKG with normalization of QT.  IV erythromycin has been discontinued.  IV Reglan has been increased to 10 mg every 6 hours.  Change morphine to 2 mg every 8 hours as needed for pain.  Follow.   9.  Hypokalemia/hypomagnesemia Likely secondary to GI losses from nausea and emesis.  Potassium repleted.  Magnesium at 1.7.  We will give magnesium sulfate 4 g IV x1.  Repeat labs in the morning.   10.  Elevated blood pressure Patient with elevated blood pressure likely secondary to acute pain.  Patient states was on small dose of lisinopril for renal protection.  Discontinue IV enalapril and placed on oral lisinopril at 5 mg daily.  Follow blood pressure may need to decrease to 2.5 if blood pressure is borderline.   11.  Staph epidermidis UTI Urine cultures positive for staph epidermidis and sensitive to ciprofloxacin, tetracycline, Bactrim, Macrobid.  Resistant to oxacillin.  IV Rocephin has been narrowed to oral ciprofloxacin.  Follow.   12.  Urinary retention Patient noted to have urinary retention and Foley catheter placed.  Patient started on Flomax.  Will likely need outpatient follow-up with urology.  13.  Hematuria Noted in Foley catheter.  Likely secondary to trauma.  Patient states is on her  menses.  Flush Foley catheter and follow.   DVT prophylaxis: Lovenox Code Status: Full Family Communication: Updated patient.  No family at bedside. Disposition Plan: Likely home when medically improved with improvement in blood glucose levels.     Consultants:   None  Procedures:   Acute abdominal series 11/29/2017  KUB 12/03/2017  Antimicrobials:   IV Rocephin 12/01/2017>>>>> 12/04/2017  Oral ciprofloxacin 12/04/2017   Subjective: Patient sitting up in chair.  Denies any chest pain no shortness of breath.  Feeling better than on admission.  Nausea improving.  Tolerating oral intake.  Patient states having her menses.  Patient noted to have blood in Foley catheter.  Objective: Vitals:   12/04/17 2052 12/04/17 2132 12/04/17 2345 12/05/17 0447  BP: (!) 134/102 98/68 111/80 119/80  Pulse: (!) 121 (!) 104 91 87  Resp:   18 18  Temp: 98.3 F (36.8 C)  98.7 F (37.1 C) 98.5 F (36.9 C)  TempSrc:   Oral Oral  SpO2:  98% 100% 98%  Weight:    52.8 kg    Intake/Output Summary (Last 24 hours)  at 12/05/2017 1320 Last data filed at 12/05/2017 0900 Gross per 24 hour  Intake 630 ml  Output 2000 ml  Net -1370 ml   Filed Weights   12/03/17 0500 12/04/17 0500 12/05/17 0447  Weight: 56.7 kg 55.9 kg 52.8 kg    Examination:  General exam: NAD Respiratory system: CTAB.  No wheezes, no crackles, no rhonchi.  Normal respiratory effort.  Cardiovascular system: Regular rate and rhythm no murmurs rubs or gallops.  No JVD.  No lower extremity edema.   Gastrointestinal system: Abdomen is soft, nontender, nondistended, positive bowel sounds.  No hepatosplenomegaly. Central nervous system: Alert and oriented. No focal neurological deficits. Extremities: Symmetric 5 x 5 power. Skin: No rashes, lesions or ulcers Psychiatry: Judgement and insight appear normal. Mood & affect appropriate.     Data Reviewed: I have personally reviewed following labs and imaging  studies  CBC: Recent Labs  Lab 11/29/17 0153  11/29/17 1203 11/30/17 0634 12/01/17 0522 12/02/17 0301 12/03/17 0336 12/05/17 0640  WBC 14.1*  --  8.0 7.9 12.1* 9.6 7.8 6.6  NEUTROABS 13.2*  --  6.1  --   --   --   --   --   HGB 13.1   < > 10.4* 11.0* 10.6* 10.5* 11.3* 10.5*  HCT 41.7   < > 33.4* 34.4* 34.0* 31.9* 34.4* 32.6*  MCV 89.7  --  90.5 89.6 91.6 86.9 87.1 89.3  PLT 456*  --  366 PLATELET CLUMPS NOTED ON SMEAR, UNABLE TO ESTIMATE 375 329 353 287   < > = values in this interval not displayed.   Basic Metabolic Panel: Recent Labs  Lab 12/02/17 1359 12/02/17 1756 12/03/17 0336 12/04/17 0241 12/05/17 0640  NA 139 138 138 140 136  K 3.0* 2.8* 3.2* 3.2* 4.2  CL 109 110 109 111 101  CO2 21* 21* 20* 25 27  GLUCOSE 163* 130* 156* 104* 343*  BUN 5* <5* <5* <5* 10  CREATININE 0.63 0.57 0.62 0.47 0.70  CALCIUM 8.2* 8.1* 8.3* 8.1* 8.4*  MG  --   --  1.5* 2.0 1.7   GFR: Estimated Creatinine Clearance: 86.6 mL/min (by C-G formula based on SCr of 0.7 mg/dL). Liver Function Tests: Recent Labs  Lab 11/29/17 0153 11/29/17 1203  AST 16 13*  ALT 14 12  ALKPHOS 80 58  BILITOT 0.7 0.6  PROT 7.9 5.9*  ALBUMIN 4.4 3.3*   Recent Labs  Lab 11/29/17 0153  LIPASE 16   No results for input(s): AMMONIA in the last 168 hours. Coagulation Profile: No results for input(s): INR, PROTIME in the last 168 hours. Cardiac Enzymes: No results for input(s): CKTOTAL, CKMB, CKMBINDEX, TROPONINI in the last 168 hours. BNP (last 3 results) No results for input(s): PROBNP in the last 8760 hours. HbA1C: No results for input(s): HGBA1C in the last 72 hours. CBG: Recent Labs  Lab 12/04/17 1122 12/04/17 1622 12/04/17 1933 12/05/17 0731 12/05/17 1131  GLUCAP 192* 172* 147* 304* 271*   Lipid Profile: No results for input(s): CHOL, HDL, LDLCALC, TRIG, CHOLHDL, LDLDIRECT in the last 72 hours. Thyroid Function Tests: No results for input(s): TSH, T4TOTAL, FREET4, T3FREE, THYROIDAB in  the last 72 hours. Anemia Panel: No results for input(s): VITAMINB12, FOLATE, FERRITIN, TIBC, IRON, RETICCTPCT in the last 72 hours. Sepsis Labs: No results for input(s): PROCALCITON, LATICACIDVEN in the last 168 hours.  Recent Results (from the past 240 hour(s))  MRSA PCR Screening     Status: None   Collection Time: 11/30/17  7:25 PM  Result Value Ref Range Status   MRSA by PCR NEGATIVE NEGATIVE Final    Comment:        The GeneXpert MRSA Assay (FDA approved for NASAL specimens only), is one component of a comprehensive MRSA colonization surveillance program. It is not intended to diagnose MRSA infection nor to guide or monitor treatment for MRSA infections. Performed at [facility], [place] [address], [place], [place] [address]   Culture, Urine     Status: Abnormal   Collection Time: 12/01/17 11:22 AM  Result Value Ref Range Status   Specimen Description   Final    URINE, CLEAN CATCH Performed at [facility], [place] [address] [place], [place] [address]    Special Requests   Final    NONE Performed at [facility], [place] [address], [place], [place] [address]    Culture >=100,000 COLONIES/mL STAPHYLOCOCCUS EPIDERMIDIS (A)  Final   Report Status 12/04/2017 FINAL  Final   Organism ID, Bacteria STAPHYLOCOCCUS EPIDERMIDIS (A)  Final      Susceptibility   Staphylococcus epidermidis - MIC*    CIPROFLOXACIN <=0.5 SENSITIVE Sensitive     GENTAMICIN <=0.5 SENSITIVE Sensitive     NITROFURANTOIN <=16 SENSITIVE Sensitive     OXACILLIN >=4 RESISTANT Resistant     TETRACYCLINE <=1 SENSITIVE Sensitive     VANCOMYCIN 1 SENSITIVE Sensitive     TRIMETH/SULFA <=10 SENSITIVE Sensitive     CLINDAMYCIN RESISTANT Resistant     RIFAMPIN <=0.5 SENSITIVE Sensitive     Inducible Clindamycin POSITIVE Resistant     * >=100,000 COLONIES/mL STAPHYLOCOCCUS EPIDERMIDIS         Radiology Studies: Dg Abd 2 Views  Result Date:  12/03/2017 CLINICAL DATA:  Emesis EXAM: ABDOMEN - 2 VIEW COMPARISON:  11/29/2017 FINDINGS: Visualized lung bases clear. No free air. Normal bowel gas pattern.  Cholecystectomy clips. Stable small left pelvic phlebolith. Regional bones unremarkable. IMPRESSION: Negative. Electronically Signed   By: [name] M.D.   On: 12/03/2017 15:20        Scheduled Meds: . chlorhexidine  15 mL Mouth Rinse BID  . ciprofloxacin  500 mg Oral BID  . enoxaparin (LOVENOX) injection  30 mg Subcutaneous Q24H  . insulin aspart  0-5 Units Subcutaneous QHS  . insulin aspart  0-9 Units Subcutaneous TID WC  . insulin glargine  15 Units Subcutaneous QHS  . lactulose  30 g Oral TID  . lisinopril  5 mg Oral Daily  . mouth rinse  15 mL Mouth Rinse q12n4p  . metoCLOPramide (REGLAN) injection  10 mg Intravenous Q6H  . senna-docusate  1 tablet Oral BID  . tamsulosin  0.4 mg Oral QPC breakfast   Continuous Infusions: . famotidine (PEPCID) IV 20 mg (12/05/17 1153)     LOS: 5 days    Time spent: 40 minutes    Irine Seal, MD [facility] Pager [phone]  If 7PM-7AM, please contact night-coverage [email or web address] Password [email or web address] 12/05/2017, 1:20 PM

## 2017-12-06 DIAGNOSIS — R52 Pain, unspecified: Secondary | ICD-10-CM

## 2017-12-06 LAB — COMPREHENSIVE METABOLIC PANEL
ALBUMIN: 3.7 g/dL (ref 3.5–5.0)
ALT: 25 U/L (ref 0–44)
AST: 21 U/L (ref 15–41)
Alkaline Phosphatase: 78 U/L (ref 38–126)
Anion gap: 11 (ref 5–15)
BILIRUBIN TOTAL: 0.8 mg/dL (ref 0.3–1.2)
BUN: 10 mg/dL (ref 6–20)
CHLORIDE: 101 mmol/L (ref 98–111)
CO2: 27 mmol/L (ref 22–32)
Calcium: 8.2 mg/dL — ABNORMAL LOW (ref 8.9–10.3)
Creatinine, Ser: 0.51 mg/dL (ref 0.44–1.00)
GFR calc Af Amer: >60 mL/min (ref >60)
GLUCOSE: 156 mg/dL — AB (ref 70–99)
POTASSIUM: 3 mmol/L — AB (ref 3.5–5.1)
Sodium: 139 mmol/L (ref 135–145)
TOTAL PROTEIN: 6.7 g/dL (ref 6.5–8.1)

## 2017-12-06 LAB — BASIC METABOLIC PANEL
ANION GAP: 7 (ref 5–15)
BUN: 14 mg/dL (ref 6–20)
CHLORIDE: 107 mmol/L (ref 98–111)
CO2: 26 mmol/L (ref 22–32)
Calcium: 8.2 mg/dL — ABNORMAL LOW (ref 8.9–10.3)
Creatinine, Ser: 0.51 mg/dL (ref 0.44–1.00)
GFR calc non Af Amer: >60 mL/min (ref >60)
Glucose, Bld: 206 mg/dL — ABNORMAL HIGH (ref 70–99)
Potassium: 3.9 mmol/L (ref 3.5–5.1)
Sodium: 140 mmol/L (ref 135–145)

## 2017-12-06 LAB — LIPASE, BLOOD: LIPASE: 17 U/L (ref 11–51)

## 2017-12-06 LAB — GLUCOSE, CAPILLARY
GLUCOSE-CAPILLARY: 151 mg/dL — AB (ref 70–99)
GLUCOSE-CAPILLARY: 137 mg/dL — AB (ref 70–99)
GLUCOSE-CAPILLARY: 111 mg/dL — AB (ref 70–99)
Glucose-Capillary: 254 mg/dL — ABNORMAL HIGH (ref 70–99)

## 2017-12-06 LAB — CBC
HEMATOCRIT: 29.9 % — AB (ref 36.0–46.0)
HEMOGLOBIN: 9.9 g/dL — AB (ref 12.0–15.0)
MCH: 28.3 pg (ref 26.0–34.0)
MCHC: 33.1 g/dL (ref 30.0–36.0)
MCV: 85.4 fL (ref 80.0–100.0)
NRBC: 0 % (ref 0.0–0.2)
Platelets: 278 10*3/uL (ref 150–400)
RBC: 3.5 MIL/uL — AB (ref 3.87–5.11)
RDW: 13.5 % (ref 11.5–15.5)
WBC: 5.6 10*3/uL (ref 4.0–10.5)

## 2017-12-06 LAB — MAGNESIUM: MAGNESIUM: 1.5 mg/dL — AB (ref 1.7–2.4)

## 2017-12-06 MED ORDER — POTASSIUM CHLORIDE CRYS ER 20 MEQ PO TBCR: 40.0000 meq | EXTENDED_RELEASE_TABLET | ORAL | Status: DC

## 2017-12-06 MED ORDER — MORPHINE SULFATE (PF) 2 MG/ML IV SOLN
2.0000 mg | Freq: Once | INTRAVENOUS | Status: AC
  Administered 2017-12-06: 2 mg via INTRAVENOUS
  Filled 2017-12-06: qty 1

## 2017-12-06 MED ORDER — MAGNESIUM SULFATE 4 GM/100ML IV SOLN
4.0000 g | Freq: Once | INTRAVENOUS | Status: AC
  Administered 2017-12-06: 4 g via INTRAVENOUS
  Filled 2017-12-06: qty 100

## 2017-12-06 MED ORDER — POTASSIUM CHLORIDE 10 MEQ/100ML IV SOLN
10.0000 meq | INTRAVENOUS | Status: AC
  Administered 2017-12-06 – 2017-12-07 (×5): 10 meq via INTRAVENOUS
  Filled 2017-12-06 (×5): qty 100

## 2017-12-06 MED ORDER — SENNOSIDES-DOCUSATE SODIUM 8.6-50 MG PO TABS: 1.0000 | ORAL_TABLET | Freq: Every evening | ORAL | Status: DC | PRN

## 2017-12-06 NOTE — Progress Notes (Signed)
PROGRESS NOTE    [name]  DJM:[id number] DOB: Aug 04, 1990 DOA: 11/29/2017 PCP: Vicenta Aly, FNP    Brief Narrative:  [name] is a 27 y.o. F with IDDM on pump, gastroparesis, known cannabis hyperemesis, and frequent admissions for N/V who presents with nausea and vomiting, not possible to control in the ER. Patient noted to go into DKA yesterday, 11/30/2017 with anion gap as high as 24, acidosis with a bicarb as low as 8, ketones noted in the urine on admission, with a blood glucose level of 442.  Patient noted with nausea and inability to keep anything down.  Patient placed on the insulin drip, IV fluids.  Patient also started on IV erythromycin for gastroparesis.    Assessment & Plan:   Principal Problem:   DKA (diabetic ketoacidoses) ([place]) Active Problems:   Nausea & vomiting   Uncontrolled type 1 diabetes mellitus (HCC)   Sinus tachycardia   Hypomagnesemia   Hypokalemia   Diabetic gastroparesis (HCC)   Protein-calorie malnutrition, severe   GERD (gastroesophageal reflux disease)   Acute urinary retention   DKA, type 1 (HCC)   Tachycardia   Elevated blood pressure reading  #1 DKA/uncontrolled type 1 diabetes Questionable etiology.  Patient denies any respiratroy symptoms.  Patient states has been compliant with her medications and has not missed any doses.  Hemoglobin A1c 8.1 on 11/02/2017.  Urinalysis on admission with greater than 500 glucose, positive ketones, moderate leukocytes, nitrite negative, greater than 50 WBCs.  Patient noted on 11/30/2017 to have a anion gap is high as 24, acidosis with a bicarb as low as 8 with a blood glucose level of 442.  Repeat urinalysis with large leukocytes, nitrite negative, greater than 50 WBCs.  Patient started empirically on IV Rocephin, with urine cultures now positive for staph epidermidis.  Anion gap closed.  Acidosis resolved.  Patient was transitioned to Lantus 30 units daily with sliding scale insulin.  Patient  with ongoing emesis and poor oral intake and as such Lantus was decreased to 10 units daily.  CBG this morning was 151 after increase of Lantus back to 30 units daily last night.  Continue sliding scale insulin.  Continue IV Reglan 10 mg every 6 hours.  Supportive care.  Follow.      2.  Intractable nausea and vomiting Presumed secondary to gastroparesis (positive gastric emptying study 2018) versus secondary to early DKA.  Also concern for conflicting reports of THC use raising question of cannabis hyperemesis.  Patient denies any recent THC use.  UDS was negative.  Patient noted to be persistently nauseated and unable to tolerate oral intake.  Patient was started on clear liquids still with nausea and emesis.  Patient was started on erythromycin for gastroparesis now QT interval had normalized.  Repeat EKG with normalization of QT interval.  IV erythromycin discontinued patient on IV Reglan at 5 mg IV every 6 hours.  KUB unremarkable.  Patient given soapsuds enemas x2 with no bowel movement.  Patient improving clinically and now tolerating oral intake with no further emesis as of 12/05/2017.  Reglan was increased to 10 mg IV every 6 hours.  Patient with nausea and emesis earlier on this afternoon.  Continue IV Reglan.  Monitor. Supportive care.   3.  Prolonged QT interval QTC was greater than 600 on admission.  QTC has normalized.  Repeat EKG with normalization of QTC.  Keep magnesium greater than 2.  Keep potassium greater than 4.   Follow.  4.  Sinus tachycardia Questionable  etiology.  May be secondary to problem #1 with ongoing nausea and pain.  There was a concern for possible withdrawal syndrome however patient denies any polysubstance use.  Continue clonidine as needed.  Hydroxyzine as needed.  Bentyl as needed.  Treat #1.  IV fluids.  Supportive care.  5.  Gastroesophageal reflux disease Continue IV Pepcid.    6.  Anemia of chronic disease No overt bleeding.  H&H stable.  7.??   Constipation Patient received a soapsuds enema yesterday with no results.  KUB unremarkable.  Patient with no bowel movement after soapsuds enemas x2.  Patient with no significant oral intake over the past 48 to 72 hours.  Continue IV Reglan.  Follow.   8. diffuse pain Patient with complaints of diffuse pain all over coming from her abdomen and spreading all over.  Patient states Toradol is not helping the pain and requesting morphine or Dilaudid which she states has helped before during her prior hospitalizations.  Patient currently on Toradol as needed.  Repeat EKG with normalization of QT.  IV erythromycin has been discontinued.  IV Reglan has been increased to 10 mg every 6 hours.  Change morphine to 2 mg every 8 hours as needed for pain.  Follow.   9.  Hypokalemia/hypomagnesemia Likely secondary to GI losses from nausea and emesis.  Potassium at 3.0 today.  Magnesium at 1.5.  Replete magnesium.  Replete potassium. Repeat labs in the morning.   10.  Elevated blood pressure Patient with elevated blood pressure likely secondary to acute pain.  Patient states was on small dose of lisinopril for renal protection.  Discontinued IV enalapril and placed on oral lisinopril at 5 mg daily.  Patient noted to have low blood pressure overnight.  Discontinue ACE inhibitor.  Follow.    11.  Staph epidermidis UTI Urine cultures positive for staph epidermidis and sensitive to ciprofloxacin, tetracycline, Bactrim, Macrobid.  Resistant to oxacillin.  IV Rocephin has been narrowed to oral ciprofloxacin.  Follow.   12.  Urinary retention Patient noted to have urinary retention and Foley catheter placed.  Patient started on Flomax.  Discontinue Foley catheter for voiding trial today.  If patient fails voiding trial will place Foley catheter back in an outpatient follow-up with urology.  13.  Hematuria Noted in Foley catheter.  Likely secondary to trauma.  Foley catheter was flushed with resolution of hematuria.     DVT prophylaxis: Lovenox Code Status: Full Family Communication: Updated patient.  No family at bedside. Disposition Plan: Likely home when medically improved with improvement in blood glucose levels.     Consultants:   None  Procedures:   Acute abdominal series 11/29/2017  KUB 12/03/2017  Antimicrobials:   IV Rocephin 12/01/2017>>>>> 12/04/2017  Oral ciprofloxacin 12/04/2017   Subjective: Patient in bed in fetal position, c/o nausea and emesis this morning after eating breakfast.  No chest pain.  No shortness of breath.  Objective: Vitals:   12/05/17 2000 12/05/17 2257 12/06/17 0441 12/06/17 0500  BP: (!) 80/66 (!) 94/50 106/73   Pulse:   88   Resp:   16   Temp:   98.7 F (37.1 C)   TempSrc:   Oral   SpO2:   99%   Weight:    56.4 kg    Intake/Output Summary (Last 24 hours) at 12/06/2017 1255 Last data filed at 12/06/2017 1106 Gross per 24 hour  Intake 1010 ml  Output 1317 ml  Net -307 ml   Filed Weights   12/04/17 0500  12/05/17 0447 12/06/17 0500  Weight: 55.9 kg 52.8 kg 56.4 kg    Examination:  General exam: NAD Respiratory system: Lungs are to auscultation bilaterally.  No wheezes, no crackles, no rhonchi.  Normal respiratory effort.  Cardiovascular system: RRR no murmurs rubs or gallops.  No lower extremity edema.  No JVD.   Gastrointestinal system: Abdomen is nontender, nondistended, soft, positive bowel sounds.  No hepatosplenomegaly.   Central nervous system: Alert and oriented. No focal neurological deficits. Extremities: Symmetric 5 x 5 power. Skin: No rashes, lesions or ulcers Psychiatry: Judgement and insight appear normal. Mood & affect appropriate.     Data Reviewed: I have personally reviewed following labs and imaging studies  CBC: Recent Labs  Lab 12/01/17 0522 12/02/17 0301 12/03/17 0336 12/05/17 0640 12/06/17 0629  WBC 12.1* 9.6 7.8 6.6 5.6  HGB 10.6* 10.5* 11.3* 10.5* 9.9*  HCT 34.0* 31.9* 34.4* 32.6* 29.9*  MCV  91.6 86.9 87.1 89.3 85.4  PLT 375 329 353 287 026   Basic Metabolic Panel: Recent Labs  Lab 12/02/17 1756 12/03/17 0336 12/04/17 0241 12/05/17 0640 12/06/17 0629  NA 138 138 140 136 140  K 2.8* 3.2* 3.2* 4.2 3.9  CL 110 109 111 101 107  CO2 21* 20* 25 27 26   GLUCOSE 130* 156* 104* 343* 206*  BUN <5* <5* <5* 10 14  CREATININE 0.57 0.62 0.47 0.70 0.51  CALCIUM 8.1* 8.3* 8.1* 8.4* 8.2*  MG  --  1.5* 2.0 1.7 1.5*   GFR: Estimated Creatinine Clearance: 86.6 mL/min (by C-G formula based on SCr of 0.51 mg/dL). Liver Function Tests: No results for input(s): AST, ALT, ALKPHOS, BILITOT, PROT, ALBUMIN in the last 168 hours. No results for input(s): LIPASE, AMYLASE in the last 168 hours. No results for input(s): AMMONIA in the last 168 hours. Coagulation Profile: No results for input(s): INR, PROTIME in the last 168 hours. Cardiac Enzymes: No results for input(s): CKTOTAL, CKMB, CKMBINDEX, TROPONINI in the last 168 hours. BNP (last 3 results) No results for input(s): PROBNP in the last 8760 hours. HbA1C: No results for input(s): HGBA1C in the last 72 hours. CBG: Recent Labs  Lab 12/05/17 1641 12/05/17 1930 12/05/17 2240 12/06/17 0805 12/06/17 1158  GLUCAP 426* 325* 325* 151* 137*   Lipid Profile: No results for input(s): CHOL, HDL, LDLCALC, TRIG, CHOLHDL, LDLDIRECT in the last 72 hours. Thyroid Function Tests: No results for input(s): TSH, T4TOTAL, FREET4, T3FREE, THYROIDAB in the last 72 hours. Anemia Panel: No results for input(s): VITAMINB12, FOLATE, FERRITIN, TIBC, IRON, RETICCTPCT in the last 72 hours. Sepsis Labs: No results for input(s): PROCALCITON, LATICACIDVEN in the last 168 hours.  Recent Results (from the past 240 hour(s))  MRSA PCR Screening     Status: None   Collection Time: 11/30/17  7:25 PM  Result Value Ref Range Status   MRSA by PCR NEGATIVE NEGATIVE Final    Comment:        The GeneXpert MRSA Assay (FDA approved for NASAL specimens only), is one  component of a comprehensive MRSA colonization surveillance program. It is not intended to diagnose MRSA infection nor to guide or monitor treatment for MRSA infections. Performed at [facility], [place] [address], [place], [place] [address]   Culture, Urine     Status: Abnormal   Collection Time: 12/01/17 11:22 AM  Result Value Ref Range Status   Specimen Description   Final    URINE, CLEAN CATCH Performed at [facility], [address]  [place], [place] [address]    Special Requests   Final    NONE Performed at [facility], [place] [address], [place], [place] [address]    Culture >=100,000 COLONIES/mL STAPHYLOCOCCUS EPIDERMIDIS (A)  Final   Report Status 12/04/2017 FINAL  Final   Organism ID, Bacteria STAPHYLOCOCCUS EPIDERMIDIS (A)  Final      Susceptibility   Staphylococcus epidermidis - MIC*    CIPROFLOXACIN <=0.5 SENSITIVE Sensitive     GENTAMICIN <=0.5 SENSITIVE Sensitive     NITROFURANTOIN <=16 SENSITIVE Sensitive     OXACILLIN >=4 RESISTANT Resistant     TETRACYCLINE <=1 SENSITIVE Sensitive     VANCOMYCIN 1 SENSITIVE Sensitive     TRIMETH/SULFA <=10 SENSITIVE Sensitive     CLINDAMYCIN RESISTANT Resistant     RIFAMPIN <=0.5 SENSITIVE Sensitive     Inducible Clindamycin POSITIVE Resistant     * >=100,000 COLONIES/mL STAPHYLOCOCCUS EPIDERMIDIS         Radiology Studies: No results found.      Scheduled Meds: . chlorhexidine  15 mL Mouth Rinse BID  . ciprofloxacin  500 mg Oral BID  . enoxaparin (LOVENOX) injection  30 mg Subcutaneous Q24H  . insulin aspart  0-5 Units Subcutaneous QHS  . insulin aspart  0-9 Units Subcutaneous TID WC  . insulin glargine  30 Units Subcutaneous QHS  . mouth rinse  15 mL Mouth Rinse q12n4p  . metoCLOPramide (REGLAN) injection  10 mg Intravenous Q6H  . tamsulosin  0.4 mg Oral QPC breakfast   Continuous Infusions: . famotidine (PEPCID) IV 20 mg (12/06/17 1245)     LOS:  6 days    Time spent: 40 minutes    Irine Seal, MD [facility] Pager [phone]  If 7PM-7AM, please contact night-coverage [email or web address] Password [email or web address] 12/06/2017, 12:55 PM

## 2017-12-06 NOTE — Plan of Care (Signed)
  Problem: Safety: Goal: Ability to remain free from injury will improve Outcome: Progressing   Problem: Elimination: Goal: Will not experience complications related to bowel motility Outcome: Progressing   Problem: Safety: Goal: Ability to remain free from injury will improve Outcome: Progressing

## 2017-12-07 DIAGNOSIS — E860 Dehydration: Secondary | ICD-10-CM

## 2017-12-07 LAB — GLUCOSE, CAPILLARY
Glucose-Capillary: 165 mg/dL — ABNORMAL HIGH (ref 70–99)
Glucose-Capillary: 192 mg/dL — ABNORMAL HIGH (ref 70–99)

## 2017-12-07 LAB — MAGNESIUM: Magnesium: 2 mg/dL (ref 1.7–2.4)

## 2017-12-07 LAB — BASIC METABOLIC PANEL
ANION GAP: 8 (ref 5–15)
BUN: 10 mg/dL (ref 6–20)
CALCIUM: 8.9 mg/dL (ref 8.9–10.3)
CO2: 26 mmol/L (ref 22–32)
Chloride: 106 mmol/L (ref 98–111)
Creatinine, Ser: 0.63 mg/dL (ref 0.44–1.00)
GFR calc Af Amer: >60 mL/min (ref >60)
GLUCOSE: 108 mg/dL — AB (ref 70–99)
Potassium: 4.3 mmol/L (ref 3.5–5.1)
SODIUM: 140 mmol/L (ref 135–145)

## 2017-12-07 MED ORDER — DICYCLOMINE HCL 10 MG PO CAPS: 10.0000 mg | ORAL_CAPSULE | Freq: Three times a day (TID) | ORAL | 0 refills | Status: DC | PRN

## 2017-12-07 MED ORDER — PANTOPRAZOLE SODIUM 40 MG PO TBEC: 40.0000 mg | DELAYED_RELEASE_TABLET | Freq: Every day | ORAL | 0 refills | Status: DC | PRN

## 2017-12-07 MED ORDER — INSULIN PEN NEEDLE 31G X 5 MM MISC: 30.0000 [IU] | Freq: Every day | 0 refills | Status: AC

## 2017-12-07 MED ORDER — TAMSULOSIN HCL 0.4 MG PO CAPS: 0.4000 mg | ORAL_CAPSULE | Freq: Every day | ORAL | 0 refills | Status: DC

## 2017-12-07 MED ORDER — INSULIN GLARGINE 100 UNIT/ML SOLOSTAR PEN: 30.0000 [IU] | PEN_INJECTOR | Freq: Every day | SUBCUTANEOUS | 0 refills | Status: DC

## 2017-12-07 MED ORDER — INSULIN ASPART 100 UNIT/ML FLEXPEN: 3.0000 [IU] | PEN_INJECTOR | Freq: Three times a day (TID) | SUBCUTANEOUS | 0 refills | Status: DC

## 2017-12-07 MED ORDER — BLOOD GLUCOSE METER KIT: PACK | 0 refills | Status: AC

## 2017-12-07 NOTE — Care Management Note (Signed)
Case Management Note  Patient Details  Name: [name] MRN: [id number] Date of Birth: 1990/09/18  Subjective/Objective:                  Discharge planning  Action/Plan: Has been seen by the Diabetes coordinator Discharged to home with self-care, orders checked for hhc needs. No CM needs present at time of discharge. Expected Discharge Date:  12/07/17               Expected Discharge Plan:  Home/Self Care  In-House Referral:     Discharge planning Services  CM Consult  Post Acute Care Choice:    Choice offered to:     DME Arranged:    DME Agency:     HH Arranged:    HH Agency:     Status of Service:  Completed, signed off  If discussed at [employer] of Stay Meetings, dates discussed:    Additional Comments:  Leeroy Cha, RN 12/07/2017, 3:08 PM

## 2017-12-07 NOTE — Discharge Summary (Signed)
Physician Discharge Summary  [name] TUU:[id number] DOB: 1990-07-27 DOA: 11/29/2017  PCP: Vicenta Aly, FNP  Admit date: 11/29/2017 Discharge date: 12/07/2017  Time spent: 60 minutes  Recommendations for Outpatient Follow-up:  1. Follow-up with endocrinologist as scheduled.  On follow-up with endocrinologist resumption of patient's insulin pump will be deferred to endocrinologist.  Patient discharged on Lantus 30 units daily as well as NovoLog 3 units with meals. 2. Follow-up with Vicenta Aly, FNP in 2 weeks.  On follow-up patient will need a basic metabolic profile and a magnesium level done to follow-up on electrolytes and renal function.  Patient also need a repeat EKG done to follow-up on QT interval.   Discharge Diagnoses:  Principal Problem:   DKA (diabetic ketoacidoses) ([name]) Active Problems:   Nausea & vomiting   Uncontrolled type 1 diabetes mellitus ([name])   Sinus tachycardia   Hypomagnesemia   Hypokalemia   Diabetic gastroparesis ([facility])   Protein-calorie malnutrition, severe   GERD (gastroesophageal reflux disease)   Acute urinary retention   DKA, type 1 ([facility])   Tachycardia   Elevated blood pressure reading   Diffuse pain   Discharge Condition: Stable and improved.  Diet recommendation: Carb modified diet  Filed Weights   12/04/17 0500 12/05/17 0447 12/06/17 0500  Weight: 55.9 kg 52.8 kg 56.4 kg    History of present illness:  Per [name] is a 27 y.o. female with history of diabetes mellitus type recently admitted for nausea vomiting multiple times presented to the ER with complaint of pain persistent vomiting last 24 hours.  Also had 3 episodes of diarrhea.  Patient stated her vomiting has changed dark-colored.  Complains of crampy abdominal pain.  Denied any fever chills chest pain or shortness of breath.  Denied missing her medications.  ED Course: In the ER patient is tachycardic.  Anion gap is around 14.  Blood  sugar is on 300.  Patient UA is pending.  Patient was given 2 L fluid bolus and admitted for intractable nausea vomiting with uncontrolled type II.  On exam patient abdomen appears benign.  LFTs and lipase are normal.  EKG shows sinus tachycardia.   Hospital Course:  1 DKA/uncontrolled type 1 diabetes Questionable etiology.  Patient denied any respiratroy symptoms.  Patient stated has been compliant with her medications and has not missed any doses.  Hemoglobin A1c 8.1 on 11/02/2017.  Urinalysis on admission with greater than 500 glucose, positive ketones, moderate leukocytes, nitrite negative, greater than 50 WBCs.  Patient noted on 11/30/2017 to have a anion gap is high as 24, acidosis with a bicarb as low as 8 with a blood glucose level of 442.  Repeat urinalysis with large leukocytes, nitrite negative, greater than 50 WBCs.  Patient started empirically on IV Rocephin, with urine cultures now positive for staph epidermidis.  Anion gap closed.  Acidosis resolved.  Patient was transitioned to Lantus 30 units daily with sliding scale insulin.  Patient with ongoing emesis and poor oral intake and as such Lantus was decreased to 10 units daily.    As patient improved clinically with no further nausea or emesis and was tolerating a diet Lantus dose was increased back to 30 units daily for better blood glucose control.  IV Reglan was adjusted and dose increased to 10 mg every 6 hours which patient tolerated.  Patient was tolerating a diet and had no further nausea and emesis by day of discharge.  Patient will be discharged home on Lantus 30 units nightly as  well as NovoLog 3 units with meals.  Patient's insulin pump has been discontinued until she follows up with the endocrinologist in the outpatient setting.     2.  Intractable nausea and vomiting Presumed secondary to gastroparesis (positive gastric emptying study 2018) versus secondary to early DKA.  Also concern for conflicting reports of THC use raising  question of cannabis hyperemesis.  Patient denies any recent THC use.  UDS was negative.  Patient noted to be persistently nauseated and unable to tolerate oral intake.  Patient was started on clear liquids but did have some bouts of nausea and emesis.  Diet was slowly advanced.   Patient was started on erythromycin for gastroparesis now QT interval had normalized.  Repeat EKG with normalization of QT interval.  IV erythromycin discontinued patient on IV Reglan at 5 mg IV every 6 hours and adjusted back to home regimen of 10 mg IV every 6 hours.  Patient was discharged home on her home dose of Reglan 10 mg before meals at bedtime.  Patient improved clinically and now tolerating oral intake with no further emesis as of 12/05/2017.  Reglan was increased to 10 mg IV every 6 hours.  Patient with nausea and emesis earlier on the afternoon of 12/06/2017.  Patient was treated with antiemetics and supportive care.  Patient improved and was tolerating diet by day of discharge with no further nausea or emesis.  Outpatient follow-up.    3.  Prolonged QT interval QTC was greater than 600 on admission.  QTC has normalized.  Repeat EKG with normalization of QTC.    Patient's electrolytes were repleted with goal of keeping magnesium greater than 2 and potassium greater than 4.  Outpatient follow-up.   4.  Sinus tachycardia Questionable etiology.  May be secondary to problem #1 with ongoing nausea and pain.  There was a concern for possible withdrawal syndrome however patient denies any polysubstance use.  Continue clonidine as needed.  Hydroxyzine as needed.  Bentyl as needed.    To maintain on IV fluids and supportive care with improvement with sinus tachycardia.    5.  Gastroesophageal reflux disease Patient maintained on IV Pepcid during the hospitalization.  Patient be discharged on Protonix.    6.  Anemia of chronic disease No overt bleeding.  H&H stable.  7.??  Constipation Patient received a soapsuds  enema yesterday with no results.  KUB unremarkable.  Patient with no bowel movement after soapsuds enemas x2.  Patient during the initial part of the hospitalization had no significant oral intake.  Patient subsequently started on IV Reglan and diet advanced which he tolerated by day of discharge.  Outpatient follow-up with PCP.   8. diffuse pain Patient with complaints of diffuse pain all over coming from her abdomen and spreading all over.  Patient states Toradol is not helping the pain and requesting morphine or Dilaudid which she states has helped before during her prior hospitalizations.  Patient  was placed on on Toradol as needed.  Repeat EKG with normalization of QT.  IV erythromycin was discontinued.  IV Reglan has been increased to 10 mg every 6 hours.   9.  Hypokalemia/hypomagnesemia Likely secondary to GI losses from nausea and emesis.  Potassium was repleted was 4.3 by day of discharge.  Magnesium also repleted and was 2.0 by day of discharge.  Outpatient follow-up with PCP.   10.  Elevated blood pressure Patient with elevated blood pressure likely secondary to acute pain.  Patient stated was on small dose  of lisinopril for renal protection.  Discontinued IV enalapril and placed on oral lisinopril at 5 mg daily.  Patient noted to have low blood pressure overnight and as such ACE inhibitor was discontinued.  Outpatient follow-up with PCP.  11.  Staph epidermidis UTI Urine cultures positive for staph epidermidis and sensitive to ciprofloxacin, tetracycline, Bactrim, Macrobid.  Resistant to oxacillin.  Patient was placed on IV Rocephin and subsequently narrowed to oral ciprofloxacin.  Patient received a total of  3 days of oral ciprofloxacin with clinical improvement.  Patient remained afebrile.  Outpatient follow-up with PCP.    12.  Urinary retention Patient noted to have urinary retention and Foley catheter placed.  Patient started on Flomax.  Patient underwent a voiding trial which  was successful.  Patient will be discharged home on Flomax.  Outpatient follow-up with PCP.   13.  Hematuria Noted in Foley catheter.  Likely secondary to trauma.  Foley catheter was flushed with resolution of hematuria.    Procedures:  Acute abdominal series 11/29/2017  KUB 12/03/2017  Consultations:  None  Discharge Exam: Vitals:   12/07/17 1421 12/07/17 1427  BP: (!) 149/121 138/82  Pulse: (!) 114 100  Resp:    Temp: 99.6 F (37.6 C)   SpO2: 100%     General: NAD Cardiovascular: Tachycardia.  No murmurs rubs or gallops. Respiratory: Lungs clear to auscultation bilaterally.  No wheezes, no crackles, no rhonchi.  Discharge Instructions   Discharge Instructions    Diet Carb Modified   Complete by:  As directed    Increase activity slowly   Complete by:  As directed      Allergies as of 12/07/2017      Reactions   Peanut-containing Drug Products Swelling, Other (See Comments)   Reaction:  Swelling of mouth and lips    Food Swelling, Other (See Comments)   Pt is allergic to strawberries.   Reaction:  Swelling of mouth and lips    Ultram [tramadol] Itching      Medication List    TAKE these medications   albuterol 108 (90 Base) MCG/ACT inhaler Commonly known as:  PROVENTIL HFA;VENTOLIN HFA Inhale 1-2 puffs into the lungs every 6 (six) hours as needed for wheezing or shortness of breath.   blood glucose meter kit and supplies Dispense based on patient and insurance preference. Use up to four times daily as directed. (FOR ICD-10 E10.9, E11.9).   dicyclomine 10 MG capsule Commonly known as:  BENTYL Take 1 capsule (10 mg total) by mouth 3 (three) times daily as needed for spasms.   glucagon 1 MG injection Inject 1 mg into the muscle once as needed (For very low blood sugars. May repeat in 15-20 minutes if needed.).   insulin aspart 100 UNIT/ML FlexPen Commonly known as:  NOVOLOG Inject 3 Units into the skin 3 (three) times daily with meals.   Insulin  Glargine 100 UNIT/ML Solostar Pen Commonly known as:  LANTUS Inject 30 Units into the skin at bedtime.   Insulin Pen Needle 31G X 5 MM Misc 30 Units by Does not apply route at bedtime.   insulin pump Soln Inject 1 each into the skin 3 times daily with meals, bedtime and 2 AM.   metoCLOPramide 10 MG tablet Commonly known as:  REGLAN Take 1 tablet (10 mg total) by mouth 3 (three) times daily with meals.   ondansetron 4 MG disintegrating tablet Commonly known as:  ZOFRAN-ODT Take 1 tablet (4 mg total) by mouth every 8 (eight)  hours as needed for nausea or vomiting.   pantoprazole 40 MG tablet Commonly known as:  PROTONIX Take 1 tablet (40 mg total) by mouth daily as needed for heartburn or indigestion.   polyethylene glycol packet Commonly known as:  MIRALAX / GLYCOLAX Take 17 g by mouth daily as needed for mild constipation.   promethazine 50 MG tablet Commonly known as:  PHENERGAN Take 0.5 tablets (25 mg total) by mouth every 6 (six) hours as needed for nausea or vomiting.   scopolamine 1 MG/3DAYS Commonly known as:  TRANSDERM-SCOP Place 1 patch (1.5 mg total) onto the skin every 3 (three) days as needed (during episodes of vomiting).   tamsulosin 0.4 MG Caps capsule Commonly known as:  FLOMAX Take 1 capsule (0.4 mg total) by mouth daily after breakfast. Start taking on:  12/08/2017   traMADol 50 MG tablet Commonly known as:  ULTRAM Take 1-2 tablets (50-100 mg total) by mouth every 6 (six) hours as needed. What changed:  reasons to take this      Allergies  Allergen Reactions  . Peanut-Containing Drug Products Swelling and Other (See Comments)    Reaction:  Swelling of mouth and lips   . 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   Reaction:  Swelling of mouth and lips   . Ultram [Tramadol] Itching   Follow-up Information    Vicenta Aly, FNP. Schedule an appointment as soon as possible for a visit in 2 week(s).   Specialty:   Nurse Practitioner Contact information: [place] [address] [phone]        Endocrinologist Follow up.   Why:  Follow-up as scheduled.           The results of significant diagnostics from this hospitalization (including imaging, microbiology, ancillary and laboratory) are listed below for reference.    Significant Diagnostic Studies: Dg Abd 2 Views  Result Date: 12/03/2017 CLINICAL DATA:  Emesis EXAM: ABDOMEN - 2 VIEW COMPARISON:  11/29/2017 FINDINGS: Visualized lung bases clear. No free air. Normal bowel gas pattern.  Cholecystectomy clips. Stable small left pelvic phlebolith. Regional bones unremarkable. IMPRESSION: Negative. Electronically Signed   By: [name] M.D.   On: 12/03/2017 15:20   Dg Abd Acute W/chest  Result Date: 11/29/2017 CLINICAL DATA:  Persistent lower abdominal pain with nausea and vomiting. EXAM: DG ABDOMEN ACUTE W/ 1V CHEST COMPARISON:  Abdominal CT scan of November 26, 2017 and abdominal radiographs of November 17, 2017 FINDINGS: The lungs are well-expanded and clear. The heart and pulmonary vascularity are normal. The mediastinum is normal in width. The trachea is midline. Within the abdomen there is a relative paucity of bowel gas. The stool burden is not excessive. There is no evidence of a fecal impaction. There are surgical clips in the gallbladder fossa. The bony structures exhibit no acute abnormalities. IMPRESSION: There is no acute cardiopulmonary abnormality. No acute intra-abdominal abnormality is observed either. Electronically Signed   By: David  Martinique M.D.   On: 11/29/2017 08:02   Dg Abd Portable 2v  Result Date: 11/17/2017 CLINICAL DATA:  Intractable nausea, vomiting EXAM: PORTABLE ABDOMEN - 2 VIEW COMPARISON:  12/18/2016 FINDINGS: Prior cholecystectomy. There is normal bowel gas pattern. No free air. No organomegaly or suspicious calcification. No acute bony abnormality. IMPRESSION: No acute findings.  Prior  cholecystectomy. Electronically Signed   By: Rolm Baptise M.D.   On: 11/17/2017 11:21   [place] Ekg Site Rite  Result  Date: 12/03/2017 If Site Rite image not attached, placement could not be confirmed due to current cardiac rhythm.  [place] Ekg Site Rite  Result Date: 11/18/2017 If Site Rite image not attached, placement could not be confirmed due to current cardiac rhythm.   Microbiology: Recent Results (from the past 240 hour(s))  MRSA PCR Screening     Status: None   Collection Time: 11/30/17  7:25 PM  Result Value Ref Range Status   MRSA by PCR NEGATIVE NEGATIVE Final    Comment:        The GeneXpert MRSA Assay (FDA approved for NASAL specimens only), is one component of a comprehensive MRSA colonization surveillance program. It is not intended to diagnose MRSA infection nor to guide or monitor treatment for MRSA infections. Performed at [facility], [place] [address], [place], [place] [address]   Culture, Urine     Status: Abnormal   Collection Time: 12/01/17 11:22 AM  Result Value Ref Range Status   Specimen Description   Final    URINE, CLEAN CATCH Performed at [facility], [place] [address] [place], [place] [address]    Special Requests   Final    NONE Performed at [facility], [place] [address] [place], [place] [address]    Culture >=100,000 COLONIES/mL STAPHYLOCOCCUS EPIDERMIDIS (A)  Final   Report Status 12/04/2017 FINAL  Final   Organism ID, Bacteria STAPHYLOCOCCUS EPIDERMIDIS (A)  Final      Susceptibility   Staphylococcus epidermidis - MIC*    CIPROFLOXACIN <=0.5 SENSITIVE Sensitive     GENTAMICIN <=0.5 SENSITIVE Sensitive     NITROFURANTOIN <=16 SENSITIVE Sensitive     OXACILLIN >=4 RESISTANT Resistant     TETRACYCLINE <=1 SENSITIVE Sensitive     VANCOMYCIN 1 SENSITIVE Sensitive     TRIMETH/SULFA <=10 SENSITIVE Sensitive     CLINDAMYCIN RESISTANT Resistant     RIFAMPIN <=0.5 SENSITIVE Sensitive      Inducible Clindamycin POSITIVE Resistant     * >=100,000 COLONIES/mL STAPHYLOCOCCUS EPIDERMIDIS     Labs: Basic Metabolic Panel: Recent Labs  Lab 12/03/17 0336 12/04/17 0241 12/05/17 0640 12/06/17 0629 12/06/17 1341 12/07/17 0606  NA 138 140 136 140 139 140  K 3.2* 3.2* 4.2 3.9 3.0* 4.3  CL 109 111 101 107 101 106  CO2 20* 25 27 26 27 26   GLUCOSE 156* 104* 343* 206* 156* 108*  BUN <5* <5* 10 14 10 10   CREATININE 0.62 0.47 0.70 0.51 0.51 0.63  CALCIUM 8.3* 8.1* 8.4* 8.2* 8.2* 8.9  MG 1.5* 2.0 1.7 1.5*  --  2.0   Liver Function Tests: Recent Labs  Lab 12/06/17 1341  AST 21  ALT 25  ALKPHOS 78  BILITOT 0.8  PROT 6.7  ALBUMIN 3.7   Recent Labs  Lab 12/06/17 1341  LIPASE 17   No results for input(s): AMMONIA in the last 168 hours. CBC: Recent Labs  Lab 12/01/17 0522 12/02/17 0301 12/03/17 0336 12/05/17 0640 12/06/17 0629  WBC 12.1* 9.6 7.8 6.6 5.6  HGB 10.6* 10.5* 11.3* 10.5* 9.9*  HCT 34.0* 31.9* 34.4* 32.6* 29.9*  MCV 91.6 86.9 87.1 89.3 85.4  PLT 375 329 353 287 278   Cardiac Enzymes: No results for input(s): CKTOTAL, CKMB, CKMBINDEX, TROPONINI in the last 168 hours. BNP: BNP (last 3 results) No results for input(s): BNP in the last 8760 hours.  ProBNP (last 3 results) No results for input(s): PROBNP in the last 8760 hours.  CBG: Recent Labs  Lab 12/06/17 1158 12/06/17 1608 12/06/17 2138 12/07/17 0728 12/07/17 1105  GLUCAP 137* 111* 254* 165* 192*       Signed:  Irine Seal MD.  [facility] 12/07/2017, 3:07 PM

## 2017-12-07 NOTE — Progress Notes (Signed)
BP= 149/121; HR= 114. Patient complained about pain at 7. Medication was given as MD ordered. RN rechecked bp manual and HR apical.

## 2017-12-07 NOTE — Progress Notes (Signed)
Patient has discharged to home on 12/07/17. Discharge instruction including medication and appointment was given to patient. Patient was education about insulin administration and BG check. Patient has no question at this time.

## 2017-12-08 ENCOUNTER — Inpatient Hospital Stay (HOSPITAL_COMMUNITY)
Admission: EM | Admit: 2017-12-08 | Discharge: 2017-12-11 | DRG: 074 | Disposition: A | Discharge status: Home / Self Care | Payer: Medicaid Other | Attending: Family Medicine | Admitting: Family Medicine

## 2017-12-08 ENCOUNTER — Other Ambulatory Visit: Payer: Self-pay

## 2017-12-08 ENCOUNTER — Encounter (HOSPITAL_COMMUNITY): Payer: Self-pay

## 2017-12-08 DIAGNOSIS — Z8249 Family history of ischemic heart disease and other diseases of the circulatory system: Secondary | ICD-10-CM | POA: Diagnosis not present

## 2017-12-08 DIAGNOSIS — K219 Gastro-esophageal reflux disease without esophagitis: Secondary | ICD-10-CM | POA: Diagnosis present

## 2017-12-08 DIAGNOSIS — Z9049 Acquired absence of other specified parts of digestive tract: Secondary | ICD-10-CM | POA: Diagnosis not present

## 2017-12-08 DIAGNOSIS — Z87440 Personal history of urinary (tract) infections: Secondary | ICD-10-CM

## 2017-12-08 DIAGNOSIS — E10649 Type 1 diabetes mellitus with hypoglycemia without coma: Secondary | ICD-10-CM | POA: Diagnosis present

## 2017-12-08 DIAGNOSIS — N179 Acute kidney failure, unspecified: Secondary | ICD-10-CM | POA: Diagnosis present

## 2017-12-08 DIAGNOSIS — E1065 Type 1 diabetes mellitus with hyperglycemia: Secondary | ICD-10-CM | POA: Diagnosis not present

## 2017-12-08 DIAGNOSIS — I5032 Chronic diastolic (congestive) heart failure: Secondary | ICD-10-CM | POA: Diagnosis present

## 2017-12-08 DIAGNOSIS — K3184 Gastroparesis: Secondary | ICD-10-CM | POA: Diagnosis present

## 2017-12-08 DIAGNOSIS — Z794 Long term (current) use of insulin: Secondary | ICD-10-CM | POA: Diagnosis not present

## 2017-12-08 DIAGNOSIS — Z833 Family history of diabetes mellitus: Secondary | ICD-10-CM

## 2017-12-08 DIAGNOSIS — J45909 Unspecified asthma, uncomplicated: Secondary | ICD-10-CM | POA: Diagnosis present

## 2017-12-08 DIAGNOSIS — E1043 Type 1 diabetes mellitus with diabetic autonomic (poly)neuropathy: Principal | ICD-10-CM | POA: Diagnosis present

## 2017-12-08 DIAGNOSIS — R000 Tachycardia, unspecified: Secondary | ICD-10-CM | POA: Diagnosis not present

## 2017-12-08 DIAGNOSIS — Z79899 Other long term (current) drug therapy: Secondary | ICD-10-CM

## 2017-12-08 DIAGNOSIS — E860 Dehydration: Secondary | ICD-10-CM | POA: Diagnosis present

## 2017-12-08 DIAGNOSIS — K760 Fatty (change of) liver, not elsewhere classified: Secondary | ICD-10-CM | POA: Diagnosis present

## 2017-12-08 DIAGNOSIS — R112 Nausea with vomiting, unspecified: Secondary | ICD-10-CM | POA: Diagnosis present

## 2017-12-08 DIAGNOSIS — N390 Urinary tract infection, site not specified: Secondary | ICD-10-CM | POA: Diagnosis present

## 2017-12-08 DIAGNOSIS — I110 Hypertensive heart disease with heart failure: Secondary | ICD-10-CM | POA: Diagnosis present

## 2017-12-08 DIAGNOSIS — Z9641 Presence of insulin pump (external) (internal): Secondary | ICD-10-CM | POA: Diagnosis present

## 2017-12-08 DIAGNOSIS — R739 Hyperglycemia, unspecified: Secondary | ICD-10-CM

## 2017-12-08 DIAGNOSIS — E1143 Type 2 diabetes mellitus with diabetic autonomic (poly)neuropathy: Secondary | ICD-10-CM | POA: Diagnosis present

## 2017-12-08 DIAGNOSIS — IMO0002 Reserved for concepts with insufficient information to code with codable children: Secondary | ICD-10-CM | POA: Diagnosis present

## 2017-12-08 LAB — COMPREHENSIVE METABOLIC PANEL WITH GFR
ALT: 27 U/L (ref 0–44)
AST: 25 U/L (ref 15–41)
Albumin: 4.4 g/dL (ref 3.5–5.0)
Alkaline Phosphatase: 102 U/L (ref 38–126)
Anion gap: 15 (ref 5–15)
BUN: 16 mg/dL (ref 6–20)
CO2: 25 mmol/L (ref 22–32)
Calcium: 9.6 mg/dL (ref 8.9–10.3)
Chloride: 96 mmol/L — ABNORMAL LOW (ref 98–111)
Creatinine, Ser: 0.9 mg/dL (ref 0.44–1.00)
GFR calc Af Amer: >60 mL/min
GFR calc non Af Amer: >60 mL/min
Glucose, Bld: 428 mg/dL — ABNORMAL HIGH (ref 70–99)
Potassium: 4.6 mmol/L (ref 3.5–5.1)
Sodium: 136 mmol/L (ref 135–145)
Total Bilirubin: 0.9 mg/dL (ref 0.3–1.2)
Total Protein: 8.2 g/dL — ABNORMAL HIGH (ref 6.5–8.1)

## 2017-12-08 LAB — GLUCOSE, CAPILLARY
GLUCOSE-CAPILLARY: 179 mg/dL — AB (ref 70–99)
GLUCOSE-CAPILLARY: 180 mg/dL — AB (ref 70–99)
GLUCOSE-CAPILLARY: 56 mg/dL — AB (ref 70–99)
Glucose-Capillary: 123 mg/dL — ABNORMAL HIGH (ref 70–99)

## 2017-12-08 LAB — URINALYSIS, ROUTINE W REFLEX MICROSCOPIC
BILIRUBIN URINE: NEGATIVE
Bacteria, UA: NONE SEEN
Glucose, UA: >=500 mg/dL — AB
Ketones, ur: 20 mg/dL — AB
Nitrite: NEGATIVE
Protein, ur: 30 mg/dL — AB
SPECIFIC GRAVITY, URINE: 1.018 (ref 1.005–1.030)
pH: 7 (ref 5.0–8.0)

## 2017-12-08 LAB — CBC
HCT: 44.5 % (ref 36.0–46.0)
Hemoglobin: 14.2 g/dL (ref 12.0–15.0)
MCH: 28.2 pg (ref 26.0–34.0)
MCHC: 31.9 g/dL (ref 30.0–36.0)
MCV: 88.3 fL (ref 80.0–100.0)
NRBC: 0 % (ref 0.0–0.2)
PLATELETS: 456 10*3/uL — AB (ref 150–400)
RBC: 5.04 MIL/uL (ref 3.87–5.11)
RDW: 14.2 % (ref 11.5–15.5)
WBC: 8.9 10*3/uL (ref 4.0–10.5)

## 2017-12-08 LAB — LIPASE, BLOOD: Lipase: 18 U/L (ref 11–51)

## 2017-12-08 LAB — CBG MONITORING, ED: Glucose-Capillary: 237 mg/dL — ABNORMAL HIGH (ref 70–99)

## 2017-12-08 LAB — I-STAT BETA HCG BLOOD, ED (MC, WL, AP ONLY): I-stat hCG, quantitative: <5 m[IU]/mL

## 2017-12-08 MED ORDER — INSULIN ASPART 100 UNIT/ML ~~LOC~~ SOLN: 0.0000 [IU] | Freq: Three times a day (TID) | SUBCUTANEOUS | Status: DC

## 2017-12-08 MED ORDER — SODIUM CHLORIDE 0.9 % IV SOLN
Freq: Once | INTRAVENOUS | Status: AC
  Administered 2017-12-08: 12:00:00 via INTRAVENOUS

## 2017-12-08 MED ORDER — SODIUM CHLORIDE 0.9 % IV SOLN
INTRAVENOUS | Status: DC
  Administered 2017-12-08 – 2017-12-10 (×4): via INTRAVENOUS

## 2017-12-08 MED ORDER — ALBUTEROL SULFATE (2.5 MG/3ML) 0.083% IN NEBU: 2.5000 mg | INHALATION_SOLUTION | Freq: Four times a day (QID) | RESPIRATORY_TRACT | Status: DC | PRN

## 2017-12-08 MED ORDER — INSULIN GLARGINE 100 UNIT/ML ~~LOC~~ SOLN
30.0000 [IU] | Freq: Every day | SUBCUTANEOUS | Status: DC
  Filled 2017-12-08: qty 0.3

## 2017-12-08 MED ORDER — KETOROLAC TROMETHAMINE 30 MG/ML IJ SOLN
30.0000 mg | Freq: Four times a day (QID) | INTRAMUSCULAR | Status: DC | PRN
  Administered 2017-12-08 – 2017-12-10 (×7): 30 mg via INTRAVENOUS
  Filled 2017-12-08 (×7): qty 1

## 2017-12-08 MED ORDER — ONDANSETRON HCL 4 MG/2ML IJ SOLN
4.0000 mg | Freq: Four times a day (QID) | INTRAMUSCULAR | Status: DC | PRN
  Administered 2017-12-08 – 2017-12-10 (×5): 4 mg via INTRAVENOUS
  Filled 2017-12-08 (×2): qty 2

## 2017-12-08 MED ORDER — PROMETHAZINE HCL 25 MG/ML IJ SOLN
25.0000 mg | Freq: Once | INTRAMUSCULAR | Status: AC
  Administered 2017-12-08: 25 mg via INTRAVENOUS
  Filled 2017-12-08: qty 1

## 2017-12-08 MED ORDER — PANTOPRAZOLE SODIUM 40 MG PO TBEC
40.0000 mg | DELAYED_RELEASE_TABLET | Freq: Every day | ORAL | Status: DC
  Administered 2017-12-10 – 2017-12-11 (×2): 40 mg via ORAL
  Filled 2017-12-08 (×3): qty 1

## 2017-12-08 MED ORDER — SODIUM CHLORIDE 0.9 % IV SOLN
Freq: Once | INTRAVENOUS | Status: AC
  Administered 2017-12-08: 10:00:00 via INTRAVENOUS

## 2017-12-08 MED ORDER — OXYCODONE HCL 5 MG PO TABS
5.0000 mg | ORAL_TABLET | ORAL | Status: DC | PRN
  Administered 2017-12-08 – 2017-12-10 (×8): 5 mg via ORAL
  Filled 2017-12-08 (×8): qty 1

## 2017-12-08 MED ORDER — ACETAMINOPHEN 325 MG PO TABS
650.0000 mg | ORAL_TABLET | Freq: Four times a day (QID) | ORAL | Status: DC | PRN
  Administered 2017-12-10: 650 mg via ORAL
  Filled 2017-12-08: qty 2

## 2017-12-08 MED ORDER — ONDANSETRON HCL 4 MG/2ML IJ SOLN
4.0000 mg | Freq: Four times a day (QID) | INTRAMUSCULAR | Status: DC | PRN
  Administered 2017-12-09: 4 mg via INTRAVENOUS
  Filled 2017-12-08 (×4): qty 2

## 2017-12-08 MED ORDER — MORPHINE SULFATE (PF) 4 MG/ML IV SOLN
4.0000 mg | Freq: Once | INTRAVENOUS | Status: AC
  Administered 2017-12-08: 4 mg via INTRAVENOUS
  Filled 2017-12-08: qty 1

## 2017-12-08 MED ORDER — SENNOSIDES-DOCUSATE SODIUM 8.6-50 MG PO TABS: 1.0000 | ORAL_TABLET | Freq: Every evening | ORAL | Status: DC | PRN

## 2017-12-08 MED ORDER — ONDANSETRON HCL 4 MG PO TABS: 4.0000 mg | ORAL_TABLET | Freq: Four times a day (QID) | ORAL | Status: DC | PRN

## 2017-12-08 MED ORDER — DIPHENHYDRAMINE HCL 50 MG/ML IJ SOLN
25.0000 mg | Freq: Once | INTRAMUSCULAR | Status: AC
  Administered 2017-12-08: 25 mg via INTRAVENOUS
  Filled 2017-12-08: qty 1

## 2017-12-08 MED ORDER — ACETAMINOPHEN 650 MG RE SUPP: 650.0000 mg | Freq: Four times a day (QID) | RECTAL | Status: DC | PRN

## 2017-12-08 MED ORDER — METOCLOPRAMIDE HCL 5 MG/ML IJ SOLN
10.0000 mg | Freq: Once | INTRAMUSCULAR | Status: AC
  Administered 2017-12-08: 10 mg via INTRAVENOUS
  Filled 2017-12-08: qty 2

## 2017-12-08 MED ORDER — BISACODYL 5 MG PO TBEC: 5.0000 mg | DELAYED_RELEASE_TABLET | Freq: Every day | ORAL | Status: DC | PRN

## 2017-12-08 MED ORDER — INSULIN PUMP
1.0000 | Freq: Three times a day (TID) | SUBCUTANEOUS | Status: DC
  Filled 2017-12-08: qty 1

## 2017-12-08 MED ORDER — INSULIN ASPART 100 UNIT/ML ~~LOC~~ SOLN: 3.0000 [IU] | Freq: Three times a day (TID) | SUBCUTANEOUS | Status: DC

## 2017-12-08 MED ORDER — METOCLOPRAMIDE HCL 10 MG PO TABS
10.0000 mg | ORAL_TABLET | Freq: Three times a day (TID) | ORAL | Status: DC
  Administered 2017-12-08: 10 mg via ORAL
  Filled 2017-12-08: qty 1

## 2017-12-08 MED ORDER — ENOXAPARIN SODIUM 40 MG/0.4ML ~~LOC~~ SOLN
40.0000 mg | SUBCUTANEOUS | Status: DC
  Administered 2017-12-08 – 2017-12-10 (×3): 40 mg via SUBCUTANEOUS
  Filled 2017-12-08 (×3): qty 0.4

## 2017-12-08 NOTE — ED Notes (Signed)
Mother Clarene Critchley) [phone] [place] ([place]) [phone]

## 2017-12-08 NOTE — ED Notes (Signed)
ED TO INPATIENT HANDOFF REPORT  Name/Age/Gender [name] 27 y.o. female  Code Status    Code Status Orders  (From admission, onward)         Start     Ordered   12/08/17 1336  Full code  Continuous     12/08/17 1339        Code Status History    Date Active Date Inactive Code Status Order ID Comments User Context   11/29/2017 0635 12/07/2017 1840 Full Code [id number]  Rise Patience, MD Inpatient   11/15/2017 0503 11/21/2017 1747 Full Code [id number]  Rise Patience, MD Inpatient   11/02/2017 1757 11/07/2017 1354 Full Code [id number]  Charlynne Cousins, MD Inpatient   01/21/2017 2304 01/29/2017 1728 Full Code [id number]  Rise Patience, MD ED   12/18/2016 0704 12/20/2016 1727 Full Code [id number]  Rondel Jumbo, PA-C ED   11/05/2016 1525 11/11/2016 1456 Full Code [id number]  Phillips Grout, MD Inpatient   10/23/2016 1342 10/29/2016 2103 Full Code [id number]  Elwin Mocha, MD ED   09/26/2016 1503 09/29/2016 1533 Full Code [id number]  Georgette Shell, MD ED   07/15/2016 1712 07/18/2016 2035 Full Code [id number]  Damita Lack, MD Inpatient   07/05/2016 1658 07/11/2016 1620 Full Code [id number]  Bonnielee Haff, MD Inpatient   06/23/2016 0856 06/28/2016 1346 Full Code [id number]  Janece Canterbury, MD Inpatient   06/12/2016 2137 06/14/2016 1723 Full Code [id number]  Sid Falcon, MD Inpatient   06/03/2016 2044 06/06/2016 2041 Full Code [id number]  Gardiner Barefoot, NP Inpatient   05/31/2016 2348 06/03/2016 2044 Full Code [id number]  Rise Patience, MD Inpatient   05/14/2016 0212 05/16/2016 1718 Full Code [id number]  Toy Baker, MD Inpatient   05/04/2016 2140 05/10/2016 2004 Full Code [id number]  Orson Eva, MD Inpatient   02/29/2016 2201 03/04/2016 1646 Full Code [id number]  Toy Baker, MD Inpatient   02/20/2016 1441 02/25/2016 1723 Full Code [id number]  Elgergawy, Silver Huguenin, MD Inpatient   01/22/2016 1021 01/25/2016 1607 Full Code [id number]  Orson Eva,  MD Inpatient   01/17/2016 0634 01/19/2016 1944 Full Code [id number]  Etta Quill, DO ED   11/18/2015 2238 11/23/2015 1231 Full Code [id number]  Bonnielee Haff, MD Inpatient   11/09/2015 2048 11/12/2015 1535 Full Code [id number]  Toy Baker, MD Inpatient   11/09/2015 2048 11/09/2015 2048 Full Code [id number]  Toy Baker, MD Inpatient   11/03/2015 0238 11/06/2015 1301 Full Code [id number]  Jani Gravel, MD Inpatient   11/02/2015 0128 11/03/2015 0238 Full Code [id number]  Phillips Grout, MD Inpatient   07/10/2015 1645 07/13/2015 1431 Full Code [id number]  Ivor Costa, MD ED   07/01/2015 2203 07/06/2015 1522 Full Code [id number]  Gennaro Africa, MD ED   06/22/2015 0624 06/24/2015 1531 Full Code [id number]  Etta Quill, DO Inpatient   06/10/2015 0156 06/11/2015 1519 Full Code [id number]  Reubin Milan, MD Inpatient   05/28/2015 1609 06/03/2015 1932 Full Code [id number]  Barton Dubois, MD Inpatient   05/24/2015 2359 05/25/2015 1858 Full Code [id number]  Theressa Millard, MD Inpatient   04/21/2015 0931 04/23/2015 1445 Full Code [id number]  Gennaro Africa, MD ED   04/08/2015 2229 04/15/2015 1605 Full Code [id number]  Reubin Milan, MD ED   03/22/2015 0243 03/25/2015 1811 Full Code [id number]  Rise Patience, MD ED   02/11/2015 1530 02/12/2015 1805 Full Code [id number]  Greer Pickerel, MD Inpatient   11/24/2014 1309 11/29/2014 1929 Full Code [id number]  Rama,  Venetia Maxon, MD Inpatient   09/16/2014 0530 09/21/2014 2007 Full Code [id number]  Etta Quill, DO ED   06/29/2012 1400 07/01/2012 1517 Full Code [id number]  Velvet Bathe, MD Inpatient   12/08/2011 1543 12/12/2011 1450 Full Code [id number]  Chyrel Masson, RN Inpatient      Home/SNF/Other Home  Chief Complaint abd pain   Level of Care/Admitting Diagnosis ED Disposition    ED Disposition Condition [facility] Area: [facility] [[id number]]  Level of Care: Med-Surg [16]  Diagnosis: Intractable nausea and vomiting  [417408]  Admitting Physician: Gerlean Ren [facility] [[id number]]  Attending Physician: Gerlean Ren [facility] [[id number]]  Estimated length of stay: past midnight tomorrow  Certification:: I certify this patient will need inpatient services for at least 2 midnights  PT Class (Do Not Modify): Inpatient [101]  PT Acc Code (Do Not Modify): Private [1]       Medical History Past Medical History:  Diagnosis Date  . Anxiety   . Arthritis    "hands, feet, knees" (12/18/2016)  . Asthma   . Diabetic gastroparesis ([place])    Per gastric emptying study 07/09/16 which showed significant delayed gastric emptying.  . Gallstones   . Gastroparesis   . GERD (gastroesophageal reflux disease)   . Heart murmur   . Hepatic steatosis 11/26/2014   and hepatomegaly  . Hypertension    hx (12/18/2016)  . Intractable cyclical vomiting syndrome    Archie Endo 12/18/2016  . Liver mass 11/26/2014  . Pancreatitis, acute 11/26/2014  . Pneumonia    "as a teen X 1" (12/18/2016)  . Type I diabetes mellitus ([place]) 2007   IDDM.  poorly controlled, multiple admits with DKA    Allergies Allergies  Allergen Reactions  . Peanut-Containing Drug Products Swelling and Other (See Comments)    Reaction:  Swelling of mouth and lips   . 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   Reaction:  Swelling of mouth and lips   . Ultram [Tramadol] Itching    IV Location/Drains/Wounds Patient Lines/Drains/Airways Status   Active Line/Drains/Airways    Name:   Placement date:   Placement time:   Site:   Days:   Peripheral IV 12/08/17 Right Antecubital   12/08/17    1013    Antecubital   less than 1          Labs/Imaging Results for orders placed or performed during the hospital encounter of 12/08/17 (from the past 48 hour(s))  Lipase, blood     Status: None   Collection Time: 12/08/17  9:45 AM  Result Value Ref Range   Lipase 18 11 - 51 U/L    Comment: Performed at [facility],  [place] [address], [place], [place] [address]  Comprehensive metabolic panel     Status: Abnormal   Collection Time: 12/08/17  9:45 AM  Result Value Ref Range   Sodium 136 135 - 145 mmol/L   Potassium 4.6 3.5 - 5.1 mmol/L   Chloride 96 (L) 98 - 111 mmol/L   CO2 25 22 - 32 mmol/L   Glucose, Bld 428 (H) 70 - 99 mg/dL   BUN 16 6 - 20 mg/dL   Creatinine, Ser 0.90 0.44 - 1.00 mg/dL   Calcium 9.6 8.9 - 10.3 mg/dL   Total Protein 8.2 (H) 6.5 - 8.1 g/dL   Albumin 4.4 3.5 - 5.0 g/dL   AST 25 15 - 41 U/L  ALT 27 0 - 44 U/L   Alkaline Phosphatase 102 38 - 126 U/L   Total Bilirubin 0.9 0.3 - 1.2 mg/dL   GFR calc non Af Amer >60 >60 mL/min   GFR calc Af Amer >60 >60 mL/min    Comment: (NOTE) The eGFR has been calculated using the CKD EPI equation. This calculation has not been validated in all clinical situations. eGFR's persistently <60 mL/min signify possible Chronic Kidney Disease.    Anion gap 15 5 - 15    Comment: Performed at [facility], [place] [address], [place], [place] [address]  CBC     Status: Abnormal   Collection Time: 12/08/17  9:45 AM  Result Value Ref Range   WBC 8.9 4.0 - 10.5 K/uL   RBC 5.04 3.87 - 5.11 MIL/uL   Hemoglobin 14.2 12.0 - 15.0 g/dL   HCT 44.5 36.0 - 46.0 %   MCV 88.3 80.0 - 100.0 fL   MCH 28.2 26.0 - 34.0 pg   MCHC 31.9 30.0 - 36.0 g/dL   RDW 14.2 11.5 - 15.5 %   Platelets 456 (H) 150 - 400 K/uL   nRBC 0.0 0.0 - 0.2 %    Comment: Performed at [facility], [place] [address], [place], [place] [address]  I-Stat beta hCG blood, ED     Status: None   Collection Time: 12/08/17 10:18 AM  Result Value Ref Range   I-stat hCG, quantitative <5.0 <5 mIU/mL   Comment 3            Comment:   GEST. AGE      CONC.  (mIU/mL)   <=1 WEEK        5 - 50     2 WEEKS       50 - 500     3 WEEKS       100 - 10,000     4 WEEKS     1,000 - 30,000        FEMALE AND NON-PREGNANT FEMALE:     LESS THAN 5 mIU/mL   Urinalysis, Routine w reflex  microscopic     Status: Abnormal   Collection Time: 12/08/17 11:26 AM  Result Value Ref Range   Color, Urine YELLOW YELLOW   APPearance HAZY (A) CLEAR   Specific Gravity, Urine 1.018 1.005 - 1.030   pH 7.0 5.0 - 8.0   Glucose, UA >=500 (A) NEGATIVE mg/dL   Hgb urine dipstick LARGE (A) NEGATIVE   Bilirubin Urine NEGATIVE NEGATIVE   Ketones, ur 20 (A) NEGATIVE mg/dL   Protein, ur 30 (A) NEGATIVE mg/dL   Nitrite NEGATIVE NEGATIVE   Leukocytes, UA LARGE (A) NEGATIVE   RBC / HPF >50 (H) 0 - 5 RBC/hpf   WBC, UA 21-50 0 - 5 WBC/hpf   Bacteria, UA NONE SEEN NONE SEEN   Squamous Epithelial / LPF 0-5 0 - 5   Mucus PRESENT    Hyaline Casts, UA PRESENT     Comment: Performed at [facility], [place] [address] [place], [place] [address]  POC CBG, ED     Status: Abnormal   Collection Time: 12/08/17  1:04 PM  Result Value Ref Range   Glucose-Capillary 237 (H) 70 - 99 mg/dL   No results found.  Pending Labs Unresulted Labs (From admission, onward)    Start     Ordered   12/15/17 0500  Creatinine, serum  (enoxaparin (LOVENOX)    CrCl >/= 30 ml/min)  Weekly,   R    Comments:  while on enoxaparin therapy    12/08/17 1339   12/09/17 0500  Comprehensive metabolic panel  Tomorrow morning,   R     12/08/17 1339   12/09/17 0500  CBC  Tomorrow morning,   R     12/08/17 1339   12/09/17 0500  Magnesium  Tomorrow morning,   R     12/08/17 1339   12/08/17 1336  CBC  (enoxaparin (LOVENOX)    CrCl >/= 30 ml/min)  Once,   R    Comments:  Baseline for enoxaparin therapy IF NOT ALREADY DRAWN.  Notify MD if PLT < 100 K.    12/08/17 1339   12/08/17 1336  Creatinine, serum  (enoxaparin (LOVENOX)    CrCl >/= 30 ml/min)  Once,   R    Comments:  Baseline for enoxaparin therapy IF NOT ALREADY DRAWN.    12/08/17 1339   12/08/17 1318  Urine culture  ONCE - STAT,   STAT     12/08/17 1318          Vitals/Pain Today's Vitals   12/08/17 1215 12/08/17 1224 12/08/17 1230 12/08/17 1300  BP:  (!) 168/99  (!) 178/104 (!) 165/92  Pulse: (!) 119 (!) 118 (!) 118 (!) 121  Resp:   16 16  Temp:      TempSrc:      SpO2: 100% 100% 100% 100%  PainSc:        Isolation Precautions No active isolations  Medications Medications  Insulin Glargine (LANTUS) Solostar Pen 30 Units (has no administration in time range)  insulin pump 1 each (has no administration in time range)  pantoprazole (PROTONIX) EC tablet 40 mg (has no administration in time range)  albuterol (PROVENTIL HFA;VENTOLIN HFA) 108 (90 Base) MCG/ACT inhaler 1-2 puff (has no administration in time range)  enoxaparin (LOVENOX) injection 40 mg (has no administration in time range)  0.9 %  sodium chloride infusion (has no administration in time range)  acetaminophen (TYLENOL) tablet 650 mg (has no administration in time range)    Or  acetaminophen (TYLENOL) suppository 650 mg (has no administration in time range)  oxyCODONE (Oxy IR/ROXICODONE) immediate release tablet 5 mg (has no administration in time range)  ketorolac (TORADOL) 30 MG/ML injection 30 mg (has no administration in time range)  senna-docusate (Senokot-S) tablet 1 tablet (has no administration in time range)  bisacodyl (DULCOLAX) EC tablet 5 mg (has no administration in time range)  ondansetron (ZOFRAN) tablet 4 mg (has no administration in time range)    Or  ondansetron (ZOFRAN) injection 4 mg (has no administration in time range)  insulin aspart (novoLOG) injection 0-9 Units (has no administration in time range)  insulin aspart (novoLOG) injection 3 Units (has no administration in time range)  metoCLOPramide (REGLAN) tablet 10 mg (has no administration in time range)  ondansetron (ZOFRAN) injection 4 mg (has no administration in time range)  promethazine (PHENERGAN) injection 25 mg (25 mg Intravenous Given 12/08/17 1021)  morphine 4 MG/ML injection 4 mg (4 mg Intravenous Given 12/08/17 1022)  0.9 %  sodium chloride infusion ( Intravenous Stopped 12/08/17 1115)   diphenhydrAMINE (BENADRYL) injection 25 mg (25 mg Intravenous Given 12/08/17 1128)  metoCLOPramide (REGLAN) injection 10 mg (10 mg Intravenous Given 12/08/17 1129)  0.9 %  sodium chloride infusion ( Intravenous New Bag/Given 12/08/17 1150)    Mobility walks with person assist

## 2017-12-08 NOTE — ED Notes (Signed)
Pt provided diet ginger ale, ice chips, and water. Pt took sip with this RN and kept it down

## 2017-12-08 NOTE — Progress Notes (Signed)
Patient arrived to floor and oriented to room.  Patient signed insulin pump contract and educated on the expectations for her to continue to wear it. Patient verbalized understanding.

## 2017-12-08 NOTE — Progress Notes (Signed)
Inpatient Diabetes Program Recommendations  AACE/ADA: New Consensus Statement on Inpatient Glycemic Control (2015)  Target Ranges:  Prepandial:   less than 140 mg/dL      Peak postprandial:   less than 180 mg/dL (1-2 hours)      Critically ill patients:  140 - 180 mg/dL   Lab Results  Component Value Date   GLUCAP 192 (H) 12/07/2017   HGBA1C 8.1 (H) 11/02/2017    Review of Glycemic Control  Diabetes history: DM1 Outpatient Diabetes medications: Lantus 30 units QHS, Novolog 3 units tidwc Current orders for Inpatient glycemic control: None.   Inpatient Diabetes Program Recommendations:    Needs IV insulin or basal/bolus insulin immediately, as pt will go into DKA. Lantus 12 units bid, Novolog 0-9 units Q4H.  Will f/u this afternoon.  Thank you. Lorenda Peck, RD, LDN, CDE Inpatient Diabetes Coordinator [phone]

## 2017-12-08 NOTE — ED Triage Notes (Signed)
Patient c/o Generalized abd. Pain. Patient recently discharged from [facility] yesterday for gastroporesis. Patient c/o n/v that started this morning. Patient states vomiting 10 times today. Patient shaking in triage.   A/Ox4.  Patient  wheelchaired back to ED room.

## 2017-12-08 NOTE — ED Notes (Signed)
CBG 237, pt began vomiting after CBG check.

## 2017-12-08 NOTE — ED Notes (Signed)
Attempted unsuccessful ultrasound IV once by this RN.

## 2017-12-08 NOTE — H&P (Signed)
History and Physical    [name] MAU:[id number] DOB: 1990-04-06 DOA: 12/08/2017  PCP: Vicenta Aly, FNP Patient coming from: Home  Chief Complaint: Nausea vomiting  HPI: [name] is a 27 y.o. female with medical history significant of insulin-dependent diabetes mellitus type 1, gastroparesis, GERD came to the hospital again for evaluation of persistent nausea and vomiting.  Patient was recently admitted here for persistent nausea and vomiting and diabetic ketoacidosis.  She was also found to have UTI therefore treated with IV Rocephin which was switched to Cipro.  She was discharged yesterday from the hospital.  At first after going home she felt better but this morning around 6 AM she started experiencing persistent nausea and vomiting.  She was unable to keep any food down therefore returned back to the hospital.  In the ER patient appeared very nauseous and had one episode of vomiting.  She was also hyperglycemic.  UA was suggestive of sterile pyuria and glucosuria.  She was recently treated with IV Rocephin and Cipro for UTI.  No bacteria seen.  Patient getting admitted for medical management.  When I saw the patient she started asking for IV pain medications but did not have any specific complaints besides nausea and vomiting.  Per reports she has had positive gastric emptying study in 2018.   Review of Systems: As per HPI otherwise 10 point review of systems negative.  Review of Systems Otherwise negative except as per HPI, including: General: Denies fever, chills, night sweats or unintended weight loss. Resp: Denies cough, wheezing, shortness of breath. Cardiac: Denies chest pain, palpitations, orthopnea, paroxysmal nocturnal dyspnea. GI: Denies  diarrhea or constipation GU: Denies dysuria, frequency, hesitancy or incontinence MS: Denies muscle aches, joint pain or swelling Neuro: Denies headache, neurologic deficits (focal weakness, numbness, tingling),  abnormal gait Psych: Denies anxiety, depression, SI/HI/AVH Skin: Denies new rashes or lesions ID: Denies sick contacts, exotic exposures, travel  Past Medical History:  Diagnosis Date  . Anxiety   . Arthritis    "hands, feet, knees" (12/18/2016)  . Asthma   . Diabetic gastroparesis ([place])    Per gastric emptying study 07/09/16 which showed significant delayed gastric emptying.  . Gallstones   . Gastroparesis   . GERD (gastroesophageal reflux disease)   . Heart murmur   . Hepatic steatosis 11/26/2014   and hepatomegaly  . Hypertension    hx (12/18/2016)  . Intractable cyclical vomiting syndrome    Archie Endo 12/18/2016  . Liver mass 11/26/2014  . Pancreatitis, acute 11/26/2014  . Pneumonia    "as a teen X 1" (12/18/2016)  . Type I diabetes mellitus ([place]) 2007   IDDM.  poorly controlled, multiple admits with DKA    Past Surgical History:  Procedure Laterality Date  . CHOLECYSTECTOMY N/A 02/11/2015   Procedure: LAPAROSCOPIC CHOLECYSTECTOMY WITH INTRAOPERATIVE CHOLANGIOGRAM;  Surgeon: Greer Pickerel, MD;  Location: [facility] ORS;  Service: General;  Laterality: N/A;  . ESOPHAGOGASTRODUODENOSCOPY (EGD) WITH PROPOFOL Left 09/20/2014   Procedure: ESOPHAGOGASTRODUODENOSCOPY (EGD) WITH PROPOFOL;  Surgeon: Arta Silence, MD;  Location: [facility] ENDOSCOPY;  Service: Endoscopy;  Laterality: Left;  . WISDOM TOOTH EXTRACTION      SOCIAL HISTORY:  reports that she has never smoked. She has never used smokeless tobacco. She reports that she has current or past drug history. Drug: Marijuana. She reports that she does not drink alcohol.  Allergies  Allergen Reactions  . Peanut-Containing Drug Products Swelling and Other (See Comments)    Reaction:  Swelling of mouth and lips   .  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   Reaction:  Swelling of mouth and lips   . Ultram [Tramadol] Itching    FAMILY HISTORY: Family History  Problem Relation Age of Onset  . Heart disease  Maternal Grandmother   . Heart disease Maternal Grandfather   . Diabetes Mother   . Hyperlipidemia Mother   . Hypertension Father   . Heart disease Father   . Hypertension Paternal Grandmother   . Cancer Paternal Grandfather      Prior to Admission medications   Medication Sig Start Date End Date Taking? Authorizing Provider  albuterol (PROVENTIL HFA;VENTOLIN HFA) 108 (90 Base) MCG/ACT inhaler Inhale 1-2 puffs into the lungs every 6 (six) hours as needed for wheezing or shortness of breath.   Yes [provider]  blood glucose meter kit and supplies Dispense based on patient and insurance preference. Use up to four times daily as directed. (FOR ICD-10 E10.9, E11.9). 12/07/17  Yes Eugenie Filler, MD  dicyclomine (BENTYL) 10 MG capsule Take 1 capsule (10 mg total) by mouth 3 (three) times daily as needed for spasms. 12/07/17  Yes Eugenie Filler, MD  glucagon (GLUCAGON EMERGENCY) 1 MG injection Inject 1 mg into the muscle once as needed (For very low blood sugars. May repeat in 15-20 minutes if needed.). 12/20/16  Yes Hongalgi, Lenis Dickinson, MD  insulin aspart (NOVOLOG FLEXPEN) 100 UNIT/ML FlexPen Inject 3 Units into the skin 3 (three) times daily with meals. 12/07/17  Yes Eugenie Filler, MD  Insulin Glargine (LANTUS) 100 UNIT/ML Solostar Pen Inject 30 Units into the skin at bedtime. 12/07/17  Yes Eugenie Filler, MD  Insulin Human (INSULIN PUMP) SOLN Inject 1 each into the skin 3 times daily with meals, bedtime and 2 AM. 11/07/17  Yes Rizwan, Saima, MD  Insulin Pen Needle 31G X 5 MM MISC 30 Units by Does not apply route at bedtime. 12/07/17  Yes Eugenie Filler, MD  metoCLOPramide (REGLAN) 10 MG tablet Take 1 tablet (10 mg total) by mouth 3 (three) times daily with meals. 11/07/17 11/07/18 Yes Debbe Odea, MD  pantoprazole (PROTONIX) 40 MG tablet Take 1 tablet (40 mg total) by mouth daily as needed for heartburn or indigestion. 12/07/17  Yes Eugenie Filler, MD    scopolamine (TRANSDERM-SCOP) 1 MG/3DAYS Place 1 patch (1.5 mg total) onto the skin every 3 (three) days as needed (during episodes of vomiting). 11/21/17  Yes Debbe Odea, MD  ondansetron (ZOFRAN ODT) 4 MG disintegrating tablet Take 1 tablet (4 mg total) by mouth every 8 (eight) hours as needed for nausea or vomiting. Patient not taking: Reported on 12/08/2017 11/07/17   Debbe Odea, MD  polyethylene glycol (MIRALAX / GLYCOLAX) packet Take 17 g by mouth daily as needed for mild constipation. Patient not taking: Reported on 11/29/2017 11/07/17   Debbe Odea, MD  promethazine (PHENERGAN) 50 MG tablet Take 0.5 tablets (25 mg total) by mouth every 6 (six) hours as needed for nausea or vomiting. Patient not taking: Reported on 12/08/2017 11/07/17   Debbe Odea, MD  tamsulosin [facility]) 0.4 MG CAPS capsule Take 1 capsule (0.4 mg total) by mouth daily after breakfast. Patient not taking: Reported on 12/08/2017 12/08/17   Eugenie Filler, MD  traMADol (ULTRAM) 50 MG tablet Take 1-2 tablets (50-100 mg total) by mouth every 6 (six) hours as needed. Patient not taking: Reported on 12/08/2017 11/07/17   Debbe Odea, MD    Physical Exam:  Vitals:   12/08/17 1224 12/08/17 1230 12/08/17 1300 12/08/17 1330  BP:  (!) 178/104 (!) 165/92 (!) 138/96  Pulse: (!) 118 (!) 118 (!) 121 (!) 123  Resp:  _0 Temp:      TempSrc:      SpO2: 100% 100% 100% 100%      Constitutional: Distress due to feeling of nausea Eyes: PERRL, lids and conjunctivae normal ENMT: Mucous membranes are moist. Posterior pharynx clear of any exudate or lesions.Normal dentition.  Neck: normal, supple, no masses, no thyromegaly Respiratory: clear to auscultation bilaterally, no wheezing, no crackles. Normal respiratory effort. No accessory muscle use.  Cardiovascular: Regular rate and rhythm, no murmurs / rubs / gallops. No extremity edema. 2+ pedal pulses. No carotid bruits.  Abdomen: no tenderness, no masses palpated.  No hepatosplenomegaly. Bowel sounds positive.  Musculoskeletal: no clubbing / cyanosis. No joint deformity upper and lower extremities. Good ROM, no contractures. Normal muscle tone.  Skin: no rashes, lesions, ulcers. No induration Neurologic: CN 2-12 grossly intact. Sensation intact, DTR normal. Strength 5/5 in all 4.  Psychiatric: Normal judgment and insight. Alert and oriented x 3. Normal mood.     Labs on Admission: I have personally reviewed following labs and imaging studies  CBC: Recent Labs  Lab 12/02/17 0301 12/03/17 0336 12/05/17 0640 12/06/17 0629 12/08/17 0945  WBC 9.6 7.8 6.6 5.6 8.9  HGB 10.5* 11.3* 10.5* 9.9* 14.2  HCT 31.9* 34.4* 32.6* 29.9* 44.5  MCV 86.9 87.1 89.3 85.4 88.3  PLT 329 353 287 278 761*   Basic Metabolic Panel: Recent Labs  Lab 12/03/17 0336 12/04/17 0241 12/05/17 0640 12/06/17 0629 12/06/17 1341 12/07/17 0606 12/08/17 0945  NA 138 140 136 140 139 140 136  K 3.2* 3.2* 4.2 3.9 3.0* 4.3 4.6  CL 109 111 101 107 101 106 96*  CO2 20* _1 GLUCOSE 156* 104* 343* 206* 156* 108* 428*  BUN <5* <5* _2 CREATININE 0.62 0.47 0.70 0.51 0.51 0.63 0.90  CALCIUM 8.3* 8.1* 8.4* 8.2* 8.2* 8.9 9.6  MG 1.5* 2.0 1.7 1.5*  --  2.0  --    GFR: Estimated Creatinine Clearance: 77 mL/min (by C-G formula based on SCr of 0.9 mg/dL). Liver Function Tests: Recent Labs  Lab 12/06/17 1341 12/08/17 0945  AST 21 25  ALT 25 27  ALKPHOS 78 102  BILITOT 0.8 0.9  PROT 6.7 8.2*  ALBUMIN 3.7 4.4   Recent Labs  Lab 12/06/17 1341 12/08/17 0945  LIPASE 17 18   No results for input(s): AMMONIA in the last 168 hours. Coagulation Profile: No results for input(s): INR, PROTIME in the last 168 hours. Cardiac Enzymes: No results for input(s): CKTOTAL, CKMB, CKMBINDEX, TROPONINI in the last 168 hours. BNP (last 3 results) No results for input(s): PROBNP in the last 8760 hours. HbA1C: No results for input(s): HGBA1C in the last 72  hours. CBG: Recent Labs  Lab 12/06/17 1608 12/06/17 2138 12/07/17 0728 12/07/17 1105 12/08/17 1304  GLUCAP 111* 254* 165* 192* 237*   Lipid Profile: No results for input(s): CHOL, HDL, LDLCALC, TRIG, CHOLHDL, LDLDIRECT in the last 72 hours. Thyroid Function Tests: No results for input(s): TSH, T4TOTAL, FREET4, T3FREE, THYROIDAB in the last 72 hours. Anemia Panel: No results for input(s): VITAMINB12, FOLATE, FERRITIN, TIBC, IRON, RETICCTPCT in the last 72 hours. Urine analysis:    Component Value Date/Time   COLORURINE YELLOW 12/08/2017 1126   APPEARANCEUR HAZY (A)  12/08/2017 1126   LABSPEC 1.018 12/08/2017 1126   PHURINE 7.0 12/08/2017 1126   GLUCOSEU >=500 (A) 12/08/2017 1126   GLUCOSEU >=1000 11/07/2012 1205   HGBUR LARGE (A) 12/08/2017 1126   BILIRUBINUR NEGATIVE 12/08/2017 1126   KETONESUR 20 (A) 12/08/2017 1126   PROTEINUR 30 (A) 12/08/2017 1126   UROBILINOGEN 0.2 11/24/2014 1045   NITRITE NEGATIVE 12/08/2017 1126   LEUKOCYTESUR LARGE (A) 12/08/2017 1126   Sepsis Labs: !!!!!!!!!!!!!!!!!!!!!!!!!!!!!!!!!!!!!!!!!!!! _0 (procalcitonin:4,lacticidven:4) ) Recent Results (from the past 240 hour(s))  MRSA PCR Screening     Status: None   Collection Time: 11/30/17  7:25 PM  Result Value Ref Range Status   MRSA by PCR NEGATIVE NEGATIVE Final    Comment:        The GeneXpert MRSA Assay (FDA approved for NASAL specimens only), is one component of a comprehensive MRSA colonization surveillance program. It is not intended to diagnose MRSA infection nor to guide or monitor treatment for MRSA infections. Performed at [facility], [place] [address], [place], [place] [address]   Culture, Urine     Status: Abnormal   Collection Time: 12/01/17 11:22 AM  Result Value Ref Range Status   Specimen Description   Final    URINE, CLEAN CATCH Performed at [facility], [place] [address], [place], [place] [address]    Special Requests    Final    NONE Performed at [facility], [place] [address] [place],  [address]    Culture >=100,000 COLONIES/mL STAPHYLOCOCCUS EPIDERMIDIS (A)  Final   Report Status 12/04/2017 FINAL  Final   Organism ID, Bacteria STAPHYLOCOCCUS EPIDERMIDIS (A)  Final      Susceptibility   Staphylococcus epidermidis - MIC*    CIPROFLOXACIN <=0.5 SENSITIVE Sensitive     GENTAMICIN <=0.5 SENSITIVE Sensitive     NITROFURANTOIN <=16 SENSITIVE Sensitive     OXACILLIN >=4 RESISTANT Resistant     TETRACYCLINE <=1 SENSITIVE Sensitive     VANCOMYCIN 1 SENSITIVE Sensitive     TRIMETH/SULFA <=10 SENSITIVE Sensitive     CLINDAMYCIN RESISTANT Resistant     RIFAMPIN <=0.5 SENSITIVE Sensitive     Inducible Clindamycin POSITIVE Resistant     * >=100,000 COLONIES/mL STAPHYLOCOCCUS EPIDERMIDIS     Radiological Exams on Admission: No results found.   All images have been reviewed by me personally.  EKG: Independently reviewed. Normal QTC, Sinus tachy  Assessment/Plan Active Problems:   Uncontrolled type 1 diabetes mellitus (HCC)   Sinus tachycardia   Chronic diastolic heart failure (HCC)   Dehydration   Diabetic gastroparesis (HCC)   Intractable nausea and vomiting    Intractable nausea and vomiting likely secondary to diabetic gastroparesis Uncontrolled diabetes mellitus type 1, on insulin pump - Admit the patient to the hospital for further care and monitoring -We will start patient on scheduled Reglan, antiemetics as needed.  EKG shows normal QTC but will repeat another EKG tomorrow morning - Provide supportive care, insulin sliding scale -Diabetic coordinator consulted  Sinus tachycardia due to moderate dehydration -Dehydration secondary to nausea and vomiting, poor p.o. intake -IV fluids, monitor urine output. - Provide supportive care  Sterile pyuria - Recently was treated for urinary tract infection with IV Rocephin.  Does not have any urinary symptoms at this time and  no leukocytosis.  Hold off on antibiotics.  History of GERD -PPI  Complaining of diffuse nonspecific pain - IV Toradol started.  Avoid narcotic use as I suspect some underlying drug-seeking behavior  DVT prophylaxis:  Lovenox Code Status: Full Family Communication: None Disposition Plan: To be determined Consults called: None Admission status: Recurrent inpatient admission for intractable nausea and vomiting   Time Spent: 65 minutes.  >50% of the time was devoted to discussing the patients care, assessment, plan and disposition with other care givers along with counseling the patient about the risks and benefits of treatment.    Ankit Arsenio Loader MD [facility] Pager [phone]  If 7PM-7AM, please contact night-coverage [email or web address] Password [email or web address]  12/08/2017, 1:46 PM

## 2017-12-08 NOTE — ED Notes (Signed)
Pt ambulatory to restroom

## 2017-12-08 NOTE — ED Provider Notes (Signed)
[place] DEPT Provider Note   CSN: [id number] Arrival date & time: 12/08/17  0935     History   Chief Complaint Chief Complaint  Patient presents with  . Abdominal Pain    HPI [name] is a 27 y.o. female.  HPI 27 year old female, with PMH of diabetes, gastroparesis, presents with 1 day history of nausea and vomiting.  Patient states symptoms started this morning and she has had approximately 10 episodes of vomiting this morning.  She notes associated diffuse abdominal cramping.  She states this is how she normally feels when she has issues with her gastroparesis.  She was just seen and evaluated at [facility] and admitted for DKA and gastroparesis.  She was discharged yesterday.  She states she was feeling fine yesterday when she was discharged.  She notes her sugars have been under control.  Denies any associated fevers, diarrhea, hematemesis. Past Medical History:  Diagnosis Date  . Anxiety   . Arthritis    "hands, feet, knees" (12/18/2016)  . Asthma   . Diabetic gastroparesis ([place])    Per gastric emptying study 07/09/16 which showed significant delayed gastric emptying.  . Gallstones   . Gastroparesis   . GERD (gastroesophageal reflux disease)   . Heart murmur   . Hepatic steatosis 11/26/2014   and hepatomegaly  . Hypertension    hx (12/18/2016)  . Intractable cyclical vomiting syndrome    Archie Endo 12/18/2016  . Liver mass 11/26/2014  . Pancreatitis, acute 11/26/2014  . Pneumonia    "as a teen X 1" (12/18/2016)  . Type I diabetes mellitus ([place]) 2007   IDDM.  poorly controlled, multiple admits with DKA    Patient Active Problem List   Diagnosis Date Noted  . Diffuse pain   . Elevated blood pressure reading 12/03/2017  . Tachycardia 11/30/2017  . Intractable vomiting with nausea 11/15/2017  . Hypernatremia 11/02/2017  . Nausea and vomiting 01/22/2017  . Nausea & vomiting 01/21/2017  . Intractable cyclical vomiting  syndrome 12/18/2016  . Leukocytosis 10/24/2016  . N&V (nausea and vomiting) 10/23/2016  . DKA, type 1 ([place]) 09/26/2016  . Thrombocytosis ([place]) 07/08/2016  . Candiduria, asymptomatic 06/23/2016  . Hyponatremia 06/13/2016  . Hematuria 05/13/2016  . UTI (urinary tract infection) 01/22/2016  . Diarrhea 11/09/2015  . Acute urinary retention   . Gastroparesis due to DM ([place]) 07/10/2015  . GERD (gastroesophageal reflux disease) 07/10/2015  . Depression with anxiety 07/10/2015  . Gastroparesis 06/22/2015  . Altered mental status 06/22/2015  . Volume depletion 06/10/2015  . Protein-calorie malnutrition, severe 06/10/2015  . Hyperglycemia   . Elevated bilirubin   . Hematemesis with nausea   . Intractable nausea and vomiting 05/28/2015  . Abdominal pain in female   . Abdominal pain 05/24/2015  . Hypertension 05/24/2015  . Dehydration   . Diabetic gastroparesis ([place])   . Chronic diastolic heart failure ([place]) 04/11/2015  . Hematemesis 04/08/2015  . DKA (diabetic ketoacidoses) ([place]) 03/22/2015  . S/P laparoscopic cholecystectomy 02/11/2015  . Postextubation stridor   . Pancreatitis, acute 11/26/2014  . Volume overload 11/26/2014  . Hypokalemia 11/26/2014  . Hepatic steatosis 11/26/2014  . Liver mass 11/26/2014  . Sepsis ([place]) 11/25/2014  . Sinus tachycardia 11/25/2014  . Hypomagnesemia 11/25/2014  . Hypophosphatemia 11/25/2014  . Elevated LFTs 11/24/2014  . AKI (acute kidney injury) ([place]) 11/24/2014  . Migraine headache 11/24/2014  . Asthma 06/29/2012  . Uncontrolled type 1 diabetes mellitus ([place]) 06/19/2010  . Goiter, unspecified 06/19/2010  Past Surgical History:  Procedure Laterality Date  . CHOLECYSTECTOMY N/A 02/11/2015   Procedure: LAPAROSCOPIC CHOLECYSTECTOMY WITH INTRAOPERATIVE CHOLANGIOGRAM;  Surgeon: Greer Pickerel, MD;  Location: [facility] ORS;  Service: General;  Laterality: N/A;  . ESOPHAGOGASTRODUODENOSCOPY (EGD) WITH PROPOFOL Left 09/20/2014   Procedure:  ESOPHAGOGASTRODUODENOSCOPY (EGD) WITH PROPOFOL;  Surgeon: Arta Silence, MD;  Location: [facility] ENDOSCOPY;  Service: Endoscopy;  Laterality: Left;  . WISDOM TOOTH EXTRACTION       OB History    Gravida  2   Para  1   Term  0   Preterm  1   AB  1   Living  1     SAB  0   TAB  1   Ectopic  0   Multiple  0   Live Births  1            Home Medications    Prior to Admission medications   Medication Sig Start Date End Date Taking? Authorizing Provider  albuterol (PROVENTIL HFA;VENTOLIN HFA) 108 (90 Base) MCG/ACT inhaler Inhale 1-2 puffs into the lungs every 6 (six) hours as needed for wheezing or shortness of breath.    [provider]  blood glucose meter kit and supplies Dispense based on patient and insurance preference. Use up to four times daily as directed. (FOR ICD-10 E10.9, E11.9). 12/07/17   Eugenie Filler, MD  dicyclomine (BENTYL) 10 MG capsule Take 1 capsule (10 mg total) by mouth 3 (three) times daily as needed for spasms. 12/07/17   Eugenie Filler, MD  glucagon (GLUCAGON EMERGENCY) 1 MG injection Inject 1 mg into the muscle once as needed (For very low blood sugars. May repeat in 15-20 minutes if needed.). 12/20/16   Hongalgi, Lenis Dickinson, MD  insulin aspart (NOVOLOG FLEXPEN) 100 UNIT/ML FlexPen Inject 3 Units into the skin 3 (three) times daily with meals. 12/07/17   Eugenie Filler, MD  Insulin Glargine (LANTUS) 100 UNIT/ML Solostar Pen Inject 30 Units into the skin at bedtime. 12/07/17   Eugenie Filler, MD  Insulin Human (INSULIN PUMP) SOLN Inject 1 each into the skin 3 times daily with meals, bedtime and 2 AM. 11/07/17   Debbe Odea, MD  Insulin Pen Needle 31G X 5 MM MISC 30 Units by Does not apply route at bedtime. 12/07/17   Eugenie Filler, MD  metoCLOPramide (REGLAN) 10 MG tablet Take 1 tablet (10 mg total) by mouth 3 (three) times daily with meals. 11/07/17 11/07/18  Debbe Odea, MD  ondansetron (ZOFRAN ODT) 4 MG disintegrating  tablet Take 1 tablet (4 mg total) by mouth every 8 (eight) hours as needed for nausea or vomiting. 11/07/17   Debbe Odea, MD  pantoprazole (PROTONIX) 40 MG tablet Take 1 tablet (40 mg total) by mouth daily as needed for heartburn or indigestion. 12/07/17   Eugenie Filler, MD  polyethylene glycol [facility] Floria Raveling) packet Take 17 g by mouth daily as needed for mild constipation. Patient not taking: Reported on 11/29/2017 11/07/17   Debbe Odea, MD  promethazine (PHENERGAN) 50 MG tablet Take 0.5 tablets (25 mg total) by mouth every 6 (six) hours as needed for nausea or vomiting. 11/07/17   Debbe Odea, MD  scopolamine (TRANSDERM-SCOP) 1 MG/3DAYS Place 1 patch (1.5 mg total) onto the skin every 3 (three) days as needed (during episodes of vomiting). 11/21/17   Debbe Odea, MD  tamsulosin (FLOMAX) 0.4 MG CAPS capsule Take 1 capsule (0.4 mg total) by mouth daily after breakfast. 12/08/17  Eugenie Filler, MD  traMADol (ULTRAM) 50 MG tablet Take 1-2 tablets (50-100 mg total) by mouth every 6 (six) hours as needed. Patient taking differently: Take 50-100 mg by mouth every 6 (six) hours as needed for moderate pain or severe pain.  11/07/17   Debbe Odea, MD    Family History Family History  Problem Relation Age of Onset  . Heart disease Maternal Grandmother   . Heart disease Maternal Grandfather   . Diabetes Mother   . Hyperlipidemia Mother   . Hypertension Father   . Heart disease Father   . Hypertension Paternal Grandmother   . Cancer Paternal Grandfather     Social History Social History   Tobacco Use  . Smoking status: Never Smoker  . Smokeless tobacco: Never Used  Substance Use Topics  . Alcohol use: No  . Drug use: Yes    Types: Marijuana    Comment: 12/18/2016 q few days"     Allergies   Peanut-containing drug products; Food; and Ultram [tramadol]   Review of Systems Review of Systems  Constitutional: Negative for chills and fever.  Respiratory:  Negative for shortness of breath.   Cardiovascular: Negative for chest pain.  Gastrointestinal: Positive for abdominal pain (diffuse), nausea and vomiting. Negative for diarrhea.  Genitourinary: Negative for dysuria.     Physical Exam Updated Vital Signs BP (!) 159/104 (BP Location: Right Arm)   Pulse (!) 123   Temp 98 F (36.7 C) (Oral)   Resp 20   LMP 12/06/2017 (Approximate)   SpO2 100%   Physical Exam  Constitutional: She is oriented to person, place, and time. She appears well-developed and well-nourished.  HENT:  Head: Normocephalic and atraumatic.  Eyes: Conjunctivae and EOM are normal.  Neck: Neck supple.  Cardiovascular: Normal rate, regular rhythm and normal heart sounds.  No murmur heard. Pulmonary/Chest: Effort normal and breath sounds normal. No respiratory distress. She has no wheezes. She has no rales.  Abdominal: Soft. Bowel sounds are normal. She exhibits no distension. There is tenderness (mildly diffusely tender).  Musculoskeletal: Normal range of motion. She exhibits no tenderness or deformity.  Neurological: She is alert and oriented to person, place, and time.  Skin: Skin is warm and dry. No rash noted. No erythema.  Psychiatric: She has a normal mood and affect. Her behavior is normal.  Nursing note and vitals reviewed.    ED Treatments / Results  Labs (all labs ordered are listed, but only abnormal results are displayed) Labs Reviewed  LIPASE, BLOOD  COMPREHENSIVE METABOLIC PANEL  CBC  URINALYSIS, ROUTINE W REFLEX MICROSCOPIC  I-STAT BETA HCG BLOOD, ED ([facility], [facility], [facility] ONLY)    EKG None  Radiology No results found.  Procedures Procedures (including critical care time)  Medications Ordered in ED Medications  promethazine (PHENERGAN) injection 25 mg (has no administration in time range)  morphine 4 MG/ML injection 4 mg (has no administration in time range)  0.9 %  sodium chloride infusion (has no administration in time range)     Initial  Impression / Assessment and Plan / ED Course  I have reviewed the triage vital signs and the nursing notes.  Pertinent labs & imaging results that were available during my care of the patient were reviewed by me and considered in my medical decision making (see chart for details).  Clinical Course as of Dec 08 1316  Wed Dec 08, 2017  1231 Pt asleep in bed. When awoken, she denies anymore N/V. Will PO challenge   [  CK]  1245 Pt has been PO tolerant to water and gatorade. Will continue to monitor for 15-30 mins longer.   [CK]    Clinical Course User Index [CK] Aliany Fiorenza S, PA-C    Vomiting despite Reglan and Phenergan.  She remains tachycardic.  Blood work shows concentrated hemoglobin and platelet count compared to previous, which is consistent with dehydration.  Urine is questionable for UTI.  Patient recently treated with Rocephin and Cipro.  Will send urine for culture.  Given intractable nausea and vomiting, will discuss with the hospitalist for admission.  Discussed with attending, Dr. Lita Mains, who agrees with plan.  Final Clinical Impressions(s) / ED Diagnoses   Final diagnoses:  None    ED Discharge Orders    None       Etter Sjogren, PA-C 12/09/17 [id number]    Julianne Rice, MD 12/11/17 [phone]

## 2017-12-08 NOTE — ED Notes (Signed)
Urine culture sent with UA 

## 2017-12-08 NOTE — ED Notes (Addendum)
Pt reminded of need for UA. Pt states she is unable to void at this time. Pt reminded of importance of sample.

## 2017-12-09 DIAGNOSIS — R112 Nausea with vomiting, unspecified: Secondary | ICD-10-CM

## 2017-12-09 DIAGNOSIS — K3184 Gastroparesis: Secondary | ICD-10-CM

## 2017-12-09 LAB — CBC
HCT: 36.8 % (ref 36.0–46.0)
Hemoglobin: 11.2 g/dL — ABNORMAL LOW (ref 12.0–15.0)
MCH: 28 pg (ref 26.0–34.0)
MCHC: 30.4 g/dL (ref 30.0–36.0)
MCV: 92 fL (ref 80.0–100.0)
NRBC: 0 % (ref 0.0–0.2)
Platelets: 424 10*3/uL — ABNORMAL HIGH (ref 150–400)
RBC: 4 MIL/uL (ref 3.87–5.11)
RDW: 14.1 % (ref 11.5–15.5)
WBC: 8.3 10*3/uL (ref 4.0–10.5)

## 2017-12-09 LAB — COMPREHENSIVE METABOLIC PANEL
ALK PHOS: 66 U/L (ref 38–126)
ALT: 19 U/L (ref 0–44)
ANION GAP: 8 (ref 5–15)
AST: 19 U/L (ref 15–41)
Albumin: 3.2 g/dL — ABNORMAL LOW (ref 3.5–5.0)
BILIRUBIN TOTAL: 0.5 mg/dL (ref 0.3–1.2)
BUN: 11 mg/dL (ref 6–20)
CO2: 26 mmol/L (ref 22–32)
CREATININE: 0.54 mg/dL (ref 0.44–1.00)
Calcium: 8.6 mg/dL — ABNORMAL LOW (ref 8.9–10.3)
Chloride: 106 mmol/L (ref 98–111)
Glucose, Bld: 46 mg/dL — ABNORMAL LOW (ref 70–99)
Potassium: 3.5 mmol/L (ref 3.5–5.1)
SODIUM: 140 mmol/L (ref 135–145)
TOTAL PROTEIN: 6 g/dL — AB (ref 6.5–8.1)

## 2017-12-09 LAB — GLUCOSE, CAPILLARY
GLUCOSE-CAPILLARY: 40 mg/dL — AB (ref 70–99)
GLUCOSE-CAPILLARY: 61 mg/dL — AB (ref 70–99)
GLUCOSE-CAPILLARY: 145 mg/dL — AB (ref 70–99)
Glucose-Capillary: 83 mg/dL (ref 70–99)
Glucose-Capillary: 235 mg/dL — ABNORMAL HIGH (ref 70–99)
Glucose-Capillary: 205 mg/dL — ABNORMAL HIGH (ref 70–99)

## 2017-12-09 LAB — MAGNESIUM: MAGNESIUM: 1.7 mg/dL (ref 1.7–2.4)

## 2017-12-09 MED ORDER — INSULIN GLARGINE 100 UNIT/ML ~~LOC~~ SOLN
15.0000 [IU] | Freq: Every day | SUBCUTANEOUS | Status: DC
  Administered 2017-12-09: 15 [IU] via SUBCUTANEOUS
  Filled 2017-12-09 (×2): qty 0.15

## 2017-12-09 MED ORDER — PROMETHAZINE HCL 25 MG/ML IJ SOLN
12.5000 mg | Freq: Once | INTRAMUSCULAR | Status: AC
  Administered 2017-12-10: 12.5 mg via INTRAVENOUS
  Filled 2017-12-09: qty 1

## 2017-12-09 MED ORDER — MORPHINE SULFATE (PF) 2 MG/ML IV SOLN
2.0000 mg | Freq: Once | INTRAVENOUS | Status: AC | PRN
  Administered 2017-12-09: 2 mg via INTRAVENOUS
  Filled 2017-12-09: qty 1

## 2017-12-09 MED ORDER — METOCLOPRAMIDE HCL 5 MG/ML IJ SOLN
10.0000 mg | Freq: Four times a day (QID) | INTRAMUSCULAR | Status: DC
  Administered 2017-12-09 – 2017-12-10 (×5): 10 mg via INTRAVENOUS
  Filled 2017-12-09 (×5): qty 2

## 2017-12-09 MED ORDER — INSULIN ASPART 100 UNIT/ML ~~LOC~~ SOLN
0.0000 [IU] | Freq: Every day | SUBCUTANEOUS | Status: DC
  Administered 2017-12-09: 2 [IU] via SUBCUTANEOUS
  Administered 2017-12-10: 3 [IU] via SUBCUTANEOUS

## 2017-12-09 MED ORDER — SULFAMETHOXAZOLE-TRIMETHOPRIM 800-160 MG PO TABS
1.0000 | ORAL_TABLET | Freq: Two times a day (BID) | ORAL | Status: DC
  Administered 2017-12-09 – 2017-12-10 (×2): 1 via ORAL
  Filled 2017-12-09 (×3): qty 1

## 2017-12-09 MED ORDER — INSULIN ASPART 100 UNIT/ML ~~LOC~~ SOLN
0.0000 [IU] | Freq: Three times a day (TID) | SUBCUTANEOUS | Status: DC
  Administered 2017-12-09: 3 [IU] via SUBCUTANEOUS
  Administered 2017-12-10: 2 [IU] via SUBCUTANEOUS
  Administered 2017-12-10: 3 [IU] via SUBCUTANEOUS
  Administered 2017-12-10: 5 [IU] via SUBCUTANEOUS
  Administered 2017-12-11: 1 [IU] via SUBCUTANEOUS

## 2017-12-09 NOTE — Progress Notes (Signed)
PROGRESS NOTE    Parul Porcelli  IZT:[id number] DOB: [birth year] DOA: 12/08/2017 PCP: Vicenta Aly, FNP   Brief Narrative:  Per HPI [name] is [name] 27 y.o. female with medical history significant of insulin-dependent diabetes mellitus type 1, gastroparesis, GERD came to the hospital again for evaluation of persistent nausea and vomiting.  Patient was recently admitted here for persistent nausea and vomiting and diabetic ketoacidosis.  She was also found to have UTI therefore treated with IV Rocephin which was switched to Cipro.  She was discharged yesterday from the hospital.  At first after going home she felt better but this morning around 6 AM she started experiencing persistent nausea and vomiting.  She was unable to keep any food down therefore returned back to the hospital.  Assessment & Plan:   Active Problems:   Uncontrolled type 1 diabetes mellitus (HCC)   Sinus tachycardia   Chronic diastolic heart failure (HCC)   Dehydration   Diabetic gastroparesis (HCC)   Intractable nausea and vomiting  Intractable nausea and vomiting likely secondary to diabetic gastroparesis Uncontrolled diabetes mellitus type 1, on insulin pump - pt had several episodes of hypoglycemia overnight, currently on pump/lantus/mealtime insulin - stop pump, start lantus 15 units with SSI  - IV reglan, antiemetics - EKG this AM with appropriate QTc -> follow repeat in AM - Clear liquid diet as tolerated  -Diabetic coordinator consulted  Sinus tachycardia due to moderate dehydration  - pain likely contributing - follow with IVF and treatment of above  Urinary Retention  UTI  -  Recently treated for UTI at last hospitalization - pt retaining >999 ml urine, place foley - follow urine culture - will start bactrim for UTI  Acute Kidney Injury: creatinine at presentation was 0.9, improved to 0.54 today, follow  History of GERD -PPI  Complaining of diffuse nonspecific pain - Pt has  IV toradol and oxycodone prn, discussed with above, important to limit opiates - morphine IV given x1  DVT prophylaxis: lovenox Code Status: full  Family Communication: none at bedside Disposition Plan: pending improvement   Consultants:   none  Procedures:   none  Antimicrobials:  Anti-infectives (From admission, onward)   Start     Dose/Rate Route Frequency Ordered Stop   12/09/17 2200  sulfamethoxazole-trimethoprim (BACTRIM DS,SEPTRA DS) 800-160 MG per tablet 1 tablet     1 tablet Oral Every 12 hours 12/09/17 1708 12/12/17 2159     Subjective: Was feeling well at d/c The next morning, N/V returned Taking reglan Triggers for gastroparesis include food, smells, reflux Felt like flare of gastroparesis Feels like retaining urine Diffuse nonspecific pain  Objective: Vitals:   12/08/17 1730 12/08/17 2207 12/09/17 0516 12/09/17 1330  BP:  115/89 (!) 155/110 (!) 149/99  Pulse:  (!) 117 (!) 115 (!) 129  Resp:  18 18 16   Temp:  98.7 F (37.1 C) 98.5 F (36.9 C) 99.7 F (37.6 C)  TempSrc:  Oral Oral Oral  SpO2:  100% 100% 100%  Weight: 56.4 kg     Height: 5\' 3"  (1.6 m)       Intake/Output Summary (Last 24 hours) at 12/09/2017 1702 Last data filed at 12/09/2017 0349 Gross per 24 hour  Intake 1800.93 ml  Output -  Net 1800.93 ml   Filed Weights   12/08/17 1730  Weight: 56.4 kg    Examination:  General exam: Appears calm and uncomfortable, writhing in pain in bed Respiratory system: Clear to auscultation. Respiratory effort normal. Cardiovascular system: S1 &  S2 heard, RRR Gastrointestinal system: Abdomen is nondistended, soft and nontender Central nervous system: Alert and oriented. No focal neurological deficits. Extremities: Symmetric 5 x 5 power. Skin: No rashes, lesions or ulcers Psychiatry: Judgement and insight appear normal. Mood & affect appropriate.     Data Reviewed: I have personally reviewed following labs and imaging studies  CBC: Recent  Labs  Lab 12/03/17 0336 12/05/17 0640 12/06/17 0629 12/08/17 0945 12/09/17 0356  WBC 7.8 6.6 5.6 8.9 8.3  HGB 11.3* 10.5* 9.9* 14.2 11.2*  HCT 34.4* 32.6* 29.9* 44.5 36.8  MCV 87.1 89.3 85.4 88.3 92.0  PLT 353 287 278 456* 440*   Basic Metabolic Panel: Recent Labs  Lab 12/04/17 0241 12/05/17 0640 12/06/17 0629 12/06/17 1341 12/07/17 0606 12/08/17 0945 12/09/17 0356  NA 140 136 140 139 140 136 140  K 3.2* 4.2 3.9 3.0* 4.3 4.6 3.5  CL 111 101 107 101 106 96* 106  CO2 25 27 26 27 26 25 26   GLUCOSE 104* 343* 206* 156* 108* 428* 46*  BUN <5* 10 14 10 10 16 11   CREATININE 0.47 0.70 0.51 0.51 0.63 0.90 0.54  CALCIUM 8.1* 8.4* 8.2* 8.2* 8.9 9.6 8.6*  MG 2.0 1.7 1.5*  --  2.0  --  1.7   GFR: Estimated Creatinine Clearance: 86.6 mL/min (by C-G formula based on SCr of 0.54 mg/dL). Liver Function Tests: Recent Labs  Lab 12/06/17 1341 12/08/17 0945 12/09/17 0356  AST 21 25 19   ALT 25 27 19   ALKPHOS 78 102 66  BILITOT 0.8 0.9 0.5  PROT 6.7 8.2* 6.0*  ALBUMIN 3.7 4.4 3.2*   Recent Labs  Lab 12/06/17 1341 12/08/17 0945  LIPASE 17 18   No results for input(s): AMMONIA in the last 168 hours. Coagulation Profile: No results for input(s): INR, PROTIME in the last 168 hours. Cardiac Enzymes: No results for input(s): CKTOTAL, CKMB, CKMBINDEX, TROPONINI in the last 168 hours. BNP (last 3 results) No results for input(s): PROBNP in the last 8760 hours. HbA1C: No results for input(s): HGBA1C in the last 72 hours. CBG: Recent Labs  Lab 12/08/17 2343 12/09/17 0355 12/09/17 0433 12/09/17 0726 12/09/17 1207  GLUCAP 180* 40* 61* 145* 83   Lipid Profile: No results for input(s): CHOL, HDL, LDLCALC, TRIG, CHOLHDL, LDLDIRECT in the last 72 hours. Thyroid Function Tests: No results for input(s): TSH, T4TOTAL, FREET4, T3FREE, THYROIDAB in the last 72 hours. Anemia Panel: No results for input(s): VITAMINB12, FOLATE, FERRITIN, TIBC, IRON, RETICCTPCT in the last 72  hours. Sepsis Labs: No results for input(s): PROCALCITON, LATICACIDVEN in the last 168 hours.  Recent Results (from the past 240 hour(s))  MRSA PCR Screening     Status: None   Collection Time: 11/30/17  7:25 PM  Result Value Ref Range Status   MRSA by PCR NEGATIVE NEGATIVE Final    Comment:        The GeneXpert MRSA Assay (FDA approved for NASAL specimens only), is one component of Nikolay Demetriou comprehensive MRSA colonization surveillance program. It is not intended to diagnose MRSA infection nor to guide or monitor treatment for MRSA infections. Performed at [facility], [place] [address], [place], [place] [address]   Culture, Urine     Status: Abnormal   Collection Time: 12/01/17 11:22 AM  Result Value Ref Range Status   Specimen Description   Final    URINE, CLEAN CATCH Performed at [facility], [place] [address], [place], [place] [address]    Special  Requests   Final    NONE Performed at [facility], [place] [address], [place], [place] [address]    Culture >=100,000 COLONIES/mL STAPHYLOCOCCUS EPIDERMIDIS (Jeanie Mccard)  Final   Report Status 12/04/2017 FINAL  Final   Organism ID, Bacteria STAPHYLOCOCCUS EPIDERMIDIS (Brogan Martis)  Final      Susceptibility   Staphylococcus epidermidis - MIC*    CIPROFLOXACIN <=0.5 SENSITIVE Sensitive     GENTAMICIN <=0.5 SENSITIVE Sensitive     NITROFURANTOIN <=16 SENSITIVE Sensitive     OXACILLIN >=4 RESISTANT Resistant     TETRACYCLINE <=1 SENSITIVE Sensitive     VANCOMYCIN 1 SENSITIVE Sensitive     TRIMETH/SULFA <=10 SENSITIVE Sensitive     CLINDAMYCIN RESISTANT Resistant     RIFAMPIN <=0.5 SENSITIVE Sensitive     Inducible Clindamycin POSITIVE Resistant     * >=100,000 COLONIES/mL STAPHYLOCOCCUS EPIDERMIDIS         Radiology Studies: No results found.      Scheduled Meds: . enoxaparin (LOVENOX) injection  40 mg Subcutaneous Q24H  . insulin aspart  0-5 Units Subcutaneous QHS  . insulin aspart   0-9 Units Subcutaneous TID WC  . insulin glargine  15 Units Subcutaneous Daily  . metoCLOPramide (REGLAN) injection  10 mg Intravenous Q6H  . pantoprazole  40 mg Oral Daily   Continuous Infusions: . sodium chloride 125 mL/hr at 12/09/17 0721     LOS: 1 day    Time spent: over 30 min    Fayrene Helper, MD [facility] Pager [phone]   If 7PM-7AM, please contact night-coverage [email or web address] Password [email or web address] 12/09/2017, 5:02 PM

## 2017-12-10 LAB — CBC
HEMATOCRIT: 34.5 % — AB (ref 36.0–46.0)
HEMOGLOBIN: 10.9 g/dL — AB (ref 12.0–15.0)
MCH: 28.1 pg (ref 26.0–34.0)
MCHC: 31.6 g/dL (ref 30.0–36.0)
MCV: 88.9 fL (ref 80.0–100.0)
Platelets: 437 10*3/uL — ABNORMAL HIGH (ref 150–400)
RBC: 3.88 MIL/uL (ref 3.87–5.11)
RDW: 14 % (ref 11.5–15.5)
WBC: 6.1 10*3/uL (ref 4.0–10.5)
nRBC: 0 % (ref 0.0–0.2)

## 2017-12-10 LAB — COMPREHENSIVE METABOLIC PANEL
ALBUMIN: 3 g/dL — AB (ref 3.5–5.0)
ALK PHOS: 68 U/L (ref 38–126)
ALT: 19 U/L (ref 0–44)
AST: 18 U/L (ref 15–41)
Anion gap: 7 (ref 5–15)
BILIRUBIN TOTAL: 0.9 mg/dL (ref 0.3–1.2)
BUN: 8 mg/dL (ref 6–20)
CALCIUM: 8.3 mg/dL — AB (ref 8.9–10.3)
CO2: 25 mmol/L (ref 22–32)
Chloride: 103 mmol/L (ref 98–111)
Creatinine, Ser: 0.53 mg/dL (ref 0.44–1.00)
GFR calc Af Amer: >60 mL/min (ref >60)
GFR calc non Af Amer: >60 mL/min (ref >60)
GLUCOSE: 264 mg/dL — AB (ref 70–99)
Potassium: 3.9 mmol/L (ref 3.5–5.1)
Sodium: 135 mmol/L (ref 135–145)
TOTAL PROTEIN: 5.7 g/dL — AB (ref 6.5–8.1)

## 2017-12-10 LAB — MAGNESIUM: Magnesium: 1.7 mg/dL (ref 1.7–2.4)

## 2017-12-10 LAB — GLUCOSE, CAPILLARY
GLUCOSE-CAPILLARY: 253 mg/dL — AB (ref 70–99)
GLUCOSE-CAPILLARY: 238 mg/dL — AB (ref 70–99)
GLUCOSE-CAPILLARY: 254 mg/dL — AB (ref 70–99)
Glucose-Capillary: 154 mg/dL — ABNORMAL HIGH (ref 70–99)

## 2017-12-10 MED ORDER — METOCLOPRAMIDE HCL 10 MG PO TABS
10.0000 mg | ORAL_TABLET | Freq: Three times a day (TID) | ORAL | Status: DC
  Administered 2017-12-10 – 2017-12-11 (×4): 10 mg via ORAL
  Filled 2017-12-10 (×4): qty 1

## 2017-12-10 MED ORDER — INSULIN ASPART 100 UNIT/ML ~~LOC~~ SOLN
3.0000 [IU] | Freq: Three times a day (TID) | SUBCUTANEOUS | Status: DC
  Administered 2017-12-10: 3 [IU] via SUBCUTANEOUS

## 2017-12-10 MED ORDER — INSULIN GLARGINE 100 UNIT/ML ~~LOC~~ SOLN
25.0000 [IU] | Freq: Every day | SUBCUTANEOUS | Status: DC
  Administered 2017-12-10 – 2017-12-11 (×2): 25 [IU] via SUBCUTANEOUS
  Filled 2017-12-10 (×2): qty 0.25

## 2017-12-10 MED ORDER — POLYETHYLENE GLYCOL 3350 17 G PO PACK
17.0000 g | PACK | Freq: Every day | ORAL | Status: DC
  Administered 2017-12-10: 17 g via ORAL
  Filled 2017-12-10 (×2): qty 1

## 2017-12-10 MED ORDER — LACTATED RINGERS IV SOLN
INTRAVENOUS | Status: DC
  Administered 2017-12-10 – 2017-12-11 (×2): via INTRAVENOUS

## 2017-12-10 NOTE — Progress Notes (Addendum)
PROGRESS NOTE    [name]  WGY:[id number] DOB: [birth year] DOA: 12/08/2017 PCP: Vicenta Aly, FNP   Brief Narrative:  Per HPI [name] is [name] 27 y.o. female with medical history significant of insulin-dependent diabetes mellitus type 1, gastroparesis, GERD came to the hospital again for evaluation of persistent nausea and vomiting.  Patient was recently admitted here for persistent nausea and vomiting and diabetic ketoacidosis.  She was also found to have UTI therefore treated with IV Rocephin which was switched to Cipro.  She was discharged yesterday from the hospital.  At first after going home she felt better but this morning around 6 AM she started experiencing persistent nausea and vomiting.  She was unable to keep any food down therefore returned back to the hospital.  Assessment & Plan:   Active Problems:   Uncontrolled type 1 diabetes mellitus (HCC)   Sinus tachycardia   Chronic diastolic heart failure (HCC)   Dehydration   Diabetic gastroparesis (HCC)   Intractable nausea and vomiting  Intractable nausea and vomiting likely secondary to diabetic gastroparesis Uncontrolled diabetes mellitus type 1, on insulin pump - Hypoglycemia improved - stop pump - increase lantus to 25 units with mealtime insulin at 3 units, follow closely - IV reglan -> transition to PO, antiemetics - EKG this AM with appropriate QTc, stable - Clear liquid diet as tolerated -> advance to full liquids -Diabetic coordinator consulted  Sinus tachycardia  - chronic, pt noted to have sinus tach at discharge of last hospitalization - possibly 2/2 discomfort.  Initially concern for hypovolemia, but she's now received IVF. - will continue IVF and follow - follow repeat EKG  Urinary Retention  UTI  - Recently treated for UTI at last hospitalization - pt retaining >999 ml urine, placed foley - Urine culture with diptheroids, unlikely true urinary pathogen, will d/c abx and follow -  will try to remove foley tomorrow   Acute Kidney Injury: creatinine at presentation was 0.9, improved to 0.54 today, follow  History of GERD -PPI  Complaining of diffuse nonspecific pain -pt again asking for IV pain meds today, discussed no indication for IV pain meds and that opiates are not good with gastroparesis.  Continue current regimen for now with toradol, APAP, and oxycodone.    DVT prophylaxis: lovenox Code Status: full  Family Communication: none at bedside Disposition Plan: pending improvement   Consultants:   none  Procedures:   none  Antimicrobials:  Anti-infectives (From admission, onward)   Start     Dose/Rate Route Frequency Ordered Stop   12/09/17 2200  sulfamethoxazole-trimethoprim (BACTRIM DS,SEPTRA DS) 800-160 MG per tablet 1 tablet     1 tablet Oral Every 12 hours 12/09/17 1708 12/12/17 2159     Subjective: Sleeping when I entered room Some N/V overnight. Still feeling poorly. When I asked about her pain, she seemed to get uncomfortable and began asking for IV pain meds.  Per discussion with nurse later in the day, she seems to be improved and is asking for full liquid diet  Objective: Vitals:   12/09/17 1330 12/09/17 2202 12/10/17 0643 12/10/17 1332  BP: (!) 149/99 127/87 (!) 153/109 (!) 122/91  Pulse: (!) 129 (!) 103 (!) 111 (!) 113  Resp: 16 16 18 20   Temp: 99.7 F (37.6 C) 98.3 F (36.8 C) 98.8 F (37.1 C) 98.8 F (37.1 C)  TempSrc: Oral Oral Oral Oral  SpO2: 100% 99% 100% 100%  Weight:      Height:  Intake/Output Summary (Last 24 hours) at 12/10/2017 1616 Last data filed at 12/10/2017 0300 Gross per 24 hour  Intake 2532.11 ml  Output 2300 ml  Net 232.11 ml   Filed Weights   12/08/17 1730  Weight: 56.4 kg    Examination:  General: No acute distress. Cardiovascular: Heart sounds show Bettey Muraoka tachycardic rate Lungs: Clear to auscultation bilaterally Abdomen: Soft, nontender, nondistended Neurological: Alert and oriented  3. Moves all extremities 4. Cranial nerves II through XII grossly intact. Skin: Warm and dry. No rashes or lesions. Extremities: No clubbing or cyanosis. No edema.  Psychiatric: Mood and affect are normal. Insight and judgment are appropriate.   Data Reviewed: I have personally reviewed following labs and imaging studies  CBC: Recent Labs  Lab 12/05/17 0640 12/06/17 0629 12/08/17 0945 12/09/17 0356 12/10/17 0601  WBC 6.6 5.6 8.9 8.3 6.1  HGB 10.5* 9.9* 14.2 11.2* 10.9*  HCT 32.6* 29.9* 44.5 36.8 34.5*  MCV 89.3 85.4 88.3 92.0 88.9  PLT 287 278 456* 424* 419*   Basic Metabolic Panel: Recent Labs  Lab 12/05/17 0640 12/06/17 0629 12/06/17 1341 12/07/17 0606 12/08/17 0945 12/09/17 0356 12/10/17 0601  NA 136 140 139 140 136 140 135  K 4.2 3.9 3.0* 4.3 4.6 3.5 3.9  CL 101 107 101 106 96* 106 103  CO2 27 26 27 26 25 26 25   GLUCOSE 343* 206* 156* 108* 428* 46* 264*  BUN 10 14 10 10 16 11 8   CREATININE 0.70 0.51 0.51 0.63 0.90 0.54 0.53  CALCIUM 8.4* 8.2* 8.2* 8.9 9.6 8.6* 8.3*  MG 1.7 1.5*  --  2.0  --  1.7 1.7   GFR: Estimated Creatinine Clearance: 86.6 mL/min (by C-G formula based on SCr of 0.53 mg/dL). Liver Function Tests: Recent Labs  Lab 12/06/17 1341 12/08/17 0945 12/09/17 0356 12/10/17 0601  AST 21 25 19 18   ALT 25 27 19 19   ALKPHOS 78 102 66 68  BILITOT 0.8 0.9 0.5 0.9  PROT 6.7 8.2* 6.0* 5.7*  ALBUMIN 3.7 4.4 3.2* 3.0*   Recent Labs  Lab 12/06/17 1341 12/08/17 0945  LIPASE 17 18   No results for input(s): AMMONIA in the last 168 hours. Coagulation Profile: No results for input(s): INR, PROTIME in the last 168 hours. Cardiac Enzymes: No results for input(s): CKTOTAL, CKMB, CKMBINDEX, TROPONINI in the last 168 hours. BNP (last 3 results) No results for input(s): PROBNP in the last 8760 hours. HbA1C: No results for input(s): HGBA1C in the last 72 hours. CBG: Recent Labs  Lab 12/09/17 1207 12/09/17 1744 12/09/17 2200 12/10/17 0803  12/10/17 1145  GLUCAP 83 235* 205* 253* 154*   Lipid Profile: No results for input(s): CHOL, HDL, LDLCALC, TRIG, CHOLHDL, LDLDIRECT in the last 72 hours. Thyroid Function Tests: No results for input(s): TSH, T4TOTAL, FREET4, T3FREE, THYROIDAB in the last 72 hours. Anemia Panel: No results for input(s): VITAMINB12, FOLATE, FERRITIN, TIBC, IRON, RETICCTPCT in the last 72 hours. Sepsis Labs: No results for input(s): PROCALCITON, LATICACIDVEN in the last 168 hours.  Recent Results (from the past 240 hour(s))  MRSA PCR Screening     Status: None   Collection Time: 11/30/17  7:25 PM  Result Value Ref Range Status   MRSA by PCR NEGATIVE NEGATIVE Final    Comment:        The GeneXpert MRSA Assay (FDA approved for NASAL specimens only), is one component of Wiatt Mahabir comprehensive MRSA colonization surveillance program. It is not intended to diagnose MRSA infection nor to  guide or monitor treatment for MRSA infections. Performed at [facility], [place] [address] [place], [place] [address]   Culture, Urine     Status: Abnormal   Collection Time: 12/01/17 11:22 AM  Result Value Ref Range Status   Specimen Description   Final    URINE, CLEAN CATCH Performed at [facility], [place] [address], [place], [place] [address]    Special Requests   Final    NONE Performed at [facility], [place] [address], [place], [place] [address]    Culture >=100,000 COLONIES/mL STAPHYLOCOCCUS EPIDERMIDIS (Charly Holcomb)  Final   Report Status 12/04/2017 FINAL  Final   Organism ID, Bacteria STAPHYLOCOCCUS EPIDERMIDIS (Eydan Chianese)  Final      Susceptibility   Staphylococcus epidermidis - MIC*    CIPROFLOXACIN <=0.5 SENSITIVE Sensitive     GENTAMICIN <=0.5 SENSITIVE Sensitive     NITROFURANTOIN <=16 SENSITIVE Sensitive     OXACILLIN >=4 RESISTANT Resistant     TETRACYCLINE <=1 SENSITIVE Sensitive     VANCOMYCIN 1 SENSITIVE Sensitive     TRIMETH/SULFA <=10 SENSITIVE Sensitive      CLINDAMYCIN RESISTANT Resistant     RIFAMPIN <=0.5 SENSITIVE Sensitive     Inducible Clindamycin POSITIVE Resistant     * >=100,000 COLONIES/mL STAPHYLOCOCCUS EPIDERMIDIS  Urine culture     Status: Abnormal (Preliminary result)   Collection Time: 12/08/17 11:26 AM  Result Value Ref Range Status   Specimen Description   Final    URINE, CLEAN CATCH Performed at [facility], [place] [address] [place], [place] [address]    Special Requests   Final    NONE Performed at [facility], [place] [address] [place], [place] [address]    Culture (Shuntay Everetts)  Final    30,000 COLONIES/mL DIPHTHEROIDS(CORYNEBACTERIUM SPECIES) Standardized susceptibility testing for this organism is not available. Performed at [facility], [place] [address] [place], [place] [address]    Report Status PENDING  Incomplete         Radiology Studies: No results found.      Scheduled Meds: . enoxaparin (LOVENOX) injection  40 mg Subcutaneous Q24H  . insulin aspart  0-5 Units Subcutaneous QHS  . insulin aspart  0-9 Units Subcutaneous TID WC  . insulin glargine  25 Units Subcutaneous Daily  . metoCLOPramide (REGLAN) injection  10 mg Intravenous Q6H  . pantoprazole  40 mg Oral Daily  . sulfamethoxazole-trimethoprim  1 tablet Oral Q12H   Continuous Infusions: . sodium chloride 125 mL/hr at 12/10/17 1140     LOS: 2 days    Time spent: over 30 min    Fayrene Helper, MD [facility] Pager [phone]   If 7PM-7AM, please contact night-coverage [email or web address] Password [facility] 12/10/2017, 4:16 PM

## 2017-12-11 LAB — CBC
HCT: 33.4 % — ABNORMAL LOW (ref 36.0–46.0)
HEMOGLOBIN: 10.8 g/dL — AB (ref 12.0–15.0)
MCH: 29.2 pg (ref 26.0–34.0)
MCHC: 32.3 g/dL (ref 30.0–36.0)
MCV: 90.3 fL (ref 80.0–100.0)
NRBC: 0 % (ref 0.0–0.2)
Platelets: 407 10*3/uL — ABNORMAL HIGH (ref 150–400)
RBC: 3.7 MIL/uL — ABNORMAL LOW (ref 3.87–5.11)
RDW: 13.9 % (ref 11.5–15.5)
WBC: 6.6 10*3/uL (ref 4.0–10.5)

## 2017-12-11 LAB — GLUCOSE, CAPILLARY
Glucose-Capillary: 128 mg/dL — ABNORMAL HIGH (ref 70–99)
Glucose-Capillary: 61 mg/dL — ABNORMAL LOW (ref 70–99)
Glucose-Capillary: 89 mg/dL (ref 70–99)

## 2017-12-11 LAB — COMPREHENSIVE METABOLIC PANEL
ALBUMIN: 3 g/dL — AB (ref 3.5–5.0)
ALT: 18 U/L (ref 0–44)
AST: 15 U/L (ref 15–41)
Alkaline Phosphatase: 61 U/L (ref 38–126)
Anion gap: 6 (ref 5–15)
BUN: 7 mg/dL (ref 6–20)
CO2: 26 mmol/L (ref 22–32)
Calcium: 8.5 mg/dL — ABNORMAL LOW (ref 8.9–10.3)
Chloride: 106 mmol/L (ref 98–111)
Creatinine, Ser: 0.54 mg/dL (ref 0.44–1.00)
GFR calc Af Amer: >60 mL/min (ref >60)
GFR calc non Af Amer: >60 mL/min (ref >60)
GLUCOSE: 154 mg/dL — AB (ref 70–99)
Potassium: 3.5 mmol/L (ref 3.5–5.1)
SODIUM: 138 mmol/L (ref 135–145)
TOTAL PROTEIN: 5.2 g/dL — AB (ref 6.5–8.1)
Total Bilirubin: 0.4 mg/dL (ref 0.3–1.2)

## 2017-12-11 LAB — MAGNESIUM: Magnesium: 1.8 mg/dL (ref 1.7–2.4)

## 2017-12-11 LAB — URINE CULTURE: Culture: 30000 — AB

## 2017-12-11 MED ORDER — INSULIN ASPART 100 UNIT/ML FLEXPEN: PEN_INJECTOR | SUBCUTANEOUS | 0 refills | Status: DC

## 2017-12-11 MED ORDER — INSULIN ASPART 100 UNIT/ML ~~LOC~~ SOLN
4.0000 [IU] | Freq: Three times a day (TID) | SUBCUTANEOUS | Status: DC
  Administered 2017-12-11 (×2): 4 [IU] via SUBCUTANEOUS

## 2017-12-11 MED ORDER — INSULIN GLARGINE 100 UNIT/ML SOLOSTAR PEN: 25.0000 [IU] | PEN_INJECTOR | Freq: Every day | SUBCUTANEOUS | 0 refills | Status: DC

## 2017-12-11 NOTE — Progress Notes (Signed)
Nursing Discharge Summary  Patient ID: Deshea Pooley MRN: [id number] DOB/AGE: January 20, 1990 27 y.o.  Admit date: 12/08/2017 Discharge date: 12/11/2017  Discharged Condition: good  Disposition: Discharge disposition: 01-Home or Self Care       Follow-up Information    Vicenta Aly, FNP Follow up.   Specialty:  Nurse Practitioner Contact information: [place] [id number] [phone]           Prescriptions Given: Prescriptions for insulin pens given.  Medications and follow up appointments discussed with patient and she verbalized understanding without further questions.    Means of Discharge: Patient offered wheelchair but she chose to ambulate downstairs to be discharged via private vehicle.  Signed: Buel Ream 12/11/2017, 1:13 PM

## 2017-12-11 NOTE — Discharge Summary (Signed)
Physician Discharge Summary  [name] VZS:[id number] DOB: 08-10-1990 DOA: 12/08/2017  PCP: Vicenta Aly, FNP  Admit date: 12/08/2017 Discharge date: 12/11/2017  Time spent: 40 minutes  Recommendations for Outpatient Follow-up:  1. Follow up outpatient CBC/CMP 2. Pt d/c'd on lantus and mealtime insulin - ensure follow up with endocrinology regarding resumption of insulin pump. 3. Follow up use of reglan and long term plan regarding this as well as EKG for qt evaluation 4. Follow up with urology for urinary retention and hematuria   Discharge Diagnoses:  Active Problems:   Uncontrolled type 1 diabetes mellitus (HCC)   Sinus tachycardia   Chronic diastolic heart failure (HCC)   Dehydration   Diabetic gastroparesis (HCC)   Intractable nausea and vomiting   Discharge Condition: stable  Diet recommendation: diabetic diet  Filed Weights   12/08/17 1730 12/11/17 0500  Weight: 56.4 kg 58.8 kg    History of present illness:  Per HPI Stefanie Moreheadis a 27 y.o.femalewith medical history significant ofinsulin-dependent diabetes mellitus type 1, gastroparesis, GERD came to the hospital again for evaluation of persistent nausea and vomiting. Patient was recently admitted here for persistent nausea and vomiting and diabetic ketoacidosis. She was also found to have UTI therefore treated with IV Rocephin which was switched to Cipro. She was discharged yesterday from the hospital. At first after going home she felt better but this morning around 6 AM she started experiencing persistent nausea and vomiting. She was unable to keep any food down therefore returned back to the hospital.  Hospital Course:  She was admitted for a flare of her gastroparesis.  She improved with reglan and gradually advancing her diet.  She was discharged on lantus 25 units daily + 1:15 carb counting/correctional insulin.   She had a foley placed for urinary retention and will need to follow up  with urology for her urinary retention and hematuria observed here.   See below for additional details Intractable nausea and vomiting likely secondary to diabetic gastroparesis Uncontrolled diabetes mellitus type 1, on insulin pump - Hypoglycemia improved - (her pump was d/c'd on the last admission and she was discharged with lantus/mealtime insulin) - She's done well on 25 units of lantus with mealtime insulin.  On discussion with her, she notes that in the past she's used carb correction of 1:15 and is comfortable using this with correctional insulin at meals at discharge.  She'll arrange follow up with her endocrinologist. - increase lantus to 25 units with mealtime insulin at 3 units, follow closely - PO reglan, QTc appropriate on day of discharge  Sinus tachycardia  - improved at discharge  Urinary Retention  UTI  - Recently treated for UTI at last hospitalization - pt retaining >999 ml urine, placed foley -> passed trial of void on discharge - Urine culture with diptheroids, unlikely true urinary pathogen, will d/c abx and follow - she sees urology as outpatient, instructed to follow up with them   Acute Kidney Injury: resolved   History of GERD -PPI  Complaining of diffuse nonspecific pain -improved today  Procedures:  none   Consultations:  none  Discharge Exam: Vitals:   12/10/17 1921 12/11/17 0500  BP: 126/86 124/74  Pulse: 100 94  Resp: 18 16  Temp: 99.2 F (37.3 C) 98.9 F (37.2 C)  SpO2: 100% 98%   Feeling better. Ready to go  General: No acute distress.  Sitting up in chair, listening to music Cardiovascular: Heart sounds show a regular rate, and rhythm Lungs: Clear to  auscultation bilaterally  Abdomen: Soft, nontender, nondistended Neurological: Alert and oriented 3. Moves all extremities 4. Cranial nerves II through XII grossly intact. Skin: Warm and dry. No rashes or lesions. Extremities: No clubbing or cyanosis. No edema.   Psychiatric: Mood and affect are normal. Insight and judgment are appropriate.  Discharge Instructions   Discharge Instructions    Call MD for:  difficulty breathing, headache or visual disturbances   Complete by:  As directed    Call MD for:  extreme fatigue   Complete by:  As directed    Call MD for:  persistant dizziness or light-headedness   Complete by:  As directed    Call MD for:  persistant nausea and vomiting   Complete by:  As directed    Call MD for:  redness, tenderness, or signs of infection (pain, swelling, redness, odor or green/yellow discharge around incision site)   Complete by:  As directed    Call MD for:  severe uncontrolled pain   Complete by:  As directed    Call MD for:  temperature >100.4   Complete by:  As directed    Diet - low sodium heart healthy   Complete by:  As directed    Discharge instructions   Complete by:  As directed    You were seen for a flare of your gastroparesis.  This improved with reglan and slow advancement of your diet.  We used 25 units of lantus and mealtime insulin for you here.  When you go home, please take 25 units of lantus plus lispro based on carb counting and correctional insulin at mealtimes.  Please schedule follow up with your endocrinologist to restart your insulin pump.  Please follow up with urology for your urinary retention.  You also had blood in your urine and should follow up for this as well.  Return for new, recurrent, or worsening symptoms.  Please ask your PCP to request records from this hospitalization so they know what was done and what the next steps will be.   Increase activity slowly   Complete by:  As directed      Allergies as of 12/11/2017      Reactions   Peanut-containing Drug Products Swelling, Other (See Comments)   Reaction:  Swelling of mouth and lips    Food Swelling, Other (See Comments)   Pt is allergic to strawberries.   Reaction:  Swelling of mouth and lips    Ultram [tramadol]  Itching      Medication List    STOP taking these medications   insulin pump Soln     TAKE these medications   albuterol 108 (90 Base) MCG/ACT inhaler Commonly known as:  PROVENTIL HFA;VENTOLIN HFA Inhale 1-2 puffs into the lungs every 6 (six) hours as needed for wheezing or shortness of breath.   blood glucose meter kit and supplies Dispense based on patient and insurance preference. Use up to four times daily as directed. (FOR ICD-10 E10.9, E11.9).   dicyclomine 10 MG capsule Commonly known as:  BENTYL Take 1 capsule (10 mg total) by mouth 3 (three) times daily as needed for spasms.   glucagon 1 MG injection Inject 1 mg into the muscle once as needed (For very low blood sugars. May repeat in 15-20 minutes if needed.).   insulin aspart 100 UNIT/ML FlexPen Commonly known as:  NOVOLOG Use with mealtime insulin based on 1:15 carb count as well as correctional insulin per sliding scale (see below) Blood sugar <120 =  0 units Blood sugar 121-150 = 1 unit Blood sugar 150-200 = 2 units Blood sugar 201-250 = 3 units Blood sugar 251-300 = 5 units Blood sugar 301-350= 7 units Blood sugar 351 - 400 = 9 units Over 400 call your doctor What changed:    how much to take  how to take this  when to take this  additional instructions   Insulin Glargine 100 UNIT/ML Solostar Pen Commonly known as:  LANTUS Inject 25 Units into the skin daily. What changed:    how much to take  when to take this   Insulin Pen Needle 31G X 5 MM Misc 30 Units by Does not apply route at bedtime.   metoCLOPramide 10 MG tablet Commonly known as:  REGLAN Take 1 tablet (10 mg total) by mouth 3 (three) times daily with meals.   ondansetron 4 MG disintegrating tablet Commonly known as:  ZOFRAN-ODT Take 1 tablet (4 mg total) by mouth every 8 (eight) hours as needed for nausea or vomiting.   pantoprazole 40 MG tablet Commonly known as:  PROTONIX Take 1 tablet (40 mg total) by mouth daily as needed  for heartburn or indigestion.   polyethylene glycol packet Commonly known as:  MIRALAX / GLYCOLAX Take 17 g by mouth daily as needed for mild constipation.   promethazine 50 MG tablet Commonly known as:  PHENERGAN Take 0.5 tablets (25 mg total) by mouth every 6 (six) hours as needed for nausea or vomiting.   scopolamine 1 MG/3DAYS Commonly known as:  TRANSDERM-SCOP Place 1 patch (1.5 mg total) onto the skin every 3 (three) days as needed (during episodes of vomiting).   tamsulosin 0.4 MG Caps capsule Commonly known as:  FLOMAX Take 1 capsule (0.4 mg total) by mouth daily after breakfast.   traMADol 50 MG tablet Commonly known as:  ULTRAM Take 1-2 tablets (50-100 mg total) by mouth every 6 (six) hours as needed.      Allergies  Allergen Reactions  . Peanut-Containing Drug Products Swelling and Other (See Comments)    Reaction:  Swelling of mouth and lips   . 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   Reaction:  Swelling of mouth and lips   . Ultram [Tramadol] Itching   Follow-up Information    Vicenta Aly, FNP Follow up.   Specialty:  Nurse Practitioner Contact information: [address] [place] [address] [phone]            The results of significant diagnostics from this hospitalization (including imaging, microbiology, ancillary and laboratory) are listed below for reference.    Significant Diagnostic Studies: Dg Abd 2 Views  Result Date: 12/03/2017 CLINICAL DATA:  Emesis EXAM: ABDOMEN - 2 VIEW COMPARISON:  11/29/2017 FINDINGS: Visualized lung bases clear. No free air. Normal bowel gas pattern.  Cholecystectomy clips. Stable small left pelvic phlebolith. Regional bones unremarkable. IMPRESSION: Negative. Electronically Signed   By: [name] M.D.   On: 12/03/2017 15:20   Dg Abd Acute W/chest  Result Date: 11/29/2017 CLINICAL DATA:  Persistent lower abdominal pain with nausea and vomiting. EXAM:  DG ABDOMEN ACUTE W/ 1V CHEST COMPARISON:  Abdominal CT scan of November 26, 2017 and abdominal radiographs of November 17, 2017 FINDINGS: The lungs are well-expanded and clear. The heart and pulmonary vascularity are normal. The mediastinum is normal in width. The trachea is midline. Within the abdomen there is a relative paucity of bowel gas. The stool burden is  not excessive. There is no evidence of a fecal impaction. There are surgical clips in the gallbladder fossa. The bony structures exhibit no acute abnormalities. IMPRESSION: There is no acute cardiopulmonary abnormality. No acute intra-abdominal abnormality is observed either. Electronically Signed   By: David  Martinique M.D.   On: 11/29/2017 08:02   Dg Abd Portable 2v  Result Date: 11/17/2017 CLINICAL DATA:  Intractable nausea, vomiting EXAM: PORTABLE ABDOMEN - 2 VIEW COMPARISON:  12/18/2016 FINDINGS: Prior cholecystectomy. There is normal bowel gas pattern. No free air. No organomegaly or suspicious calcification. No acute bony abnormality. IMPRESSION: No acute findings.  Prior cholecystectomy. Electronically Signed   By: Rolm Baptise M.D.   On: 11/17/2017 11:21   [place] Ekg Site Rite  Result Date: 12/03/2017 If Site Rite image not attached, placement could not be confirmed due to current cardiac rhythm.  [place] Ekg Site Rite  Result Date: 11/18/2017 If Site Rite image not attached, placement could not be confirmed due to current cardiac rhythm.   Microbiology: Recent Results (from the past 240 hour(s))  Urine culture     Status: Abnormal (Preliminary result)   Collection Time: 12/08/17 11:26 AM  Result Value Ref Range Status   Specimen Description   Final    URINE, CLEAN CATCH Performed at [facility], [place] [address], [place], [place] [address]    Special Requests   Final    NONE Performed at [facility], [place] [address], [place], [place] [address]    Culture (A)  Final    30,000 COLONIES/mL  DIPHTHEROIDS(CORYNEBACTERIUM SPECIES) Standardized susceptibility testing for this organism is not available. Performed at [facility], [place] [address] [place], [place] [address]    Report Status PENDING  Incomplete     Labs: Basic Metabolic Panel: Recent Labs  Lab 12/06/17 0629  12/07/17 0606 12/08/17 0945 12/09/17 0356 12/10/17 0601 12/11/17 0449  NA 140   < > 140 136 140 135 138  K 3.9   < > 4.3 4.6 3.5 3.9 3.5  CL 107   < > 106 96* 106 103 106  CO2 26   < > 26 25 26 25 26   GLUCOSE 206*   < > 108* 428* 46* 264* 154*  BUN 14   < > 10 16 11 8 7   CREATININE 0.51   < > 0.63 0.90 0.54 0.53 0.54  CALCIUM 8.2*   < > 8.9 9.6 8.6* 8.3* 8.5*  MG 1.5*  --  2.0  --  1.7 1.7 1.8   < > = values in this interval not displayed.   Liver Function Tests: Recent Labs  Lab 12/06/17 1341 12/08/17 0945 12/09/17 0356 12/10/17 0601 12/11/17 0449  AST 21 25 19 18 15   ALT 25 27 19 19 18   ALKPHOS 78 102 66 68 61  BILITOT 0.8 0.9 0.5 0.9 0.4  PROT 6.7 8.2* 6.0* 5.7* 5.2*  ALBUMIN 3.7 4.4 3.2* 3.0* 3.0*   Recent Labs  Lab 12/06/17 1341 12/08/17 0945  LIPASE 17 18   No results for input(s): AMMONIA in the last 168 hours. CBC: Recent Labs  Lab 12/06/17 0629 12/08/17 0945 12/09/17 0356 12/10/17 0601 12/11/17 0449  WBC 5.6 8.9 8.3 6.1 6.6  HGB 9.9* 14.2 11.2* 10.9* 10.8*  HCT 29.9* 44.5 36.8 34.5* 33.4*  MCV 85.4 88.3 92.0 88.9 90.3  PLT 278 456* 424* 437* 407*   Cardiac Enzymes: No results for input(s): CKTOTAL, CKMB, CKMBINDEX, TROPONINI in the last 168 hours.  BNP: BNP (last 3 results) No results for input(s): BNP in the last 8760 hours.  ProBNP (last 3 results) No results for input(s): PROBNP in the last 8760 hours.  CBG: Recent Labs  Lab 12/10/17 1636 12/10/17 2123 12/11/17 0804 12/11/17 1137 12/11/17 1203  GLUCAP 238* 254* 128* 61* 89       Signed:  Fayrene Helper MD.  [facility] 12/11/2017, 12:52 PM

## 2017-12-14 ENCOUNTER — Other Ambulatory Visit: Payer: Self-pay

## 2017-12-14 ENCOUNTER — Encounter (HOSPITAL_COMMUNITY): Payer: Self-pay

## 2017-12-14 ENCOUNTER — Emergency Department (HOSPITAL_COMMUNITY)
Admission: EM | Admit: 2017-12-14 | Discharge: 2017-12-15 | Disposition: A | Discharge status: Home / Self Care | Payer: Medicaid Other | Attending: Emergency Medicine | Admitting: Emergency Medicine

## 2017-12-14 DIAGNOSIS — K3184 Gastroparesis: Secondary | ICD-10-CM | POA: Diagnosis not present

## 2017-12-14 DIAGNOSIS — Z79899 Other long term (current) drug therapy: Secondary | ICD-10-CM | POA: Insufficient documentation

## 2017-12-14 DIAGNOSIS — E109 Type 1 diabetes mellitus without complications: Secondary | ICD-10-CM | POA: Insufficient documentation

## 2017-12-14 DIAGNOSIS — J45909 Unspecified asthma, uncomplicated: Secondary | ICD-10-CM | POA: Diagnosis not present

## 2017-12-14 DIAGNOSIS — R1084 Generalized abdominal pain: Secondary | ICD-10-CM | POA: Diagnosis present

## 2017-12-14 DIAGNOSIS — I10 Essential (primary) hypertension: Secondary | ICD-10-CM | POA: Insufficient documentation

## 2017-12-14 LAB — LIPASE, BLOOD: Lipase: 19 U/L (ref 11–51)

## 2017-12-14 LAB — I-STAT BETA HCG BLOOD, ED (MC, WL, AP ONLY)

## 2017-12-14 LAB — COMPREHENSIVE METABOLIC PANEL
ALK PHOS: 87 U/L (ref 38–126)
ALT: 19 U/L (ref 0–44)
AST: 23 U/L (ref 15–41)
Albumin: 4.4 g/dL (ref 3.5–5.0)
Anion gap: 9 (ref 5–15)
BILIRUBIN TOTAL: 0.6 mg/dL (ref 0.3–1.2)
BUN: 14 mg/dL (ref 6–20)
CALCIUM: 9.4 mg/dL (ref 8.9–10.3)
CO2: 29 mmol/L (ref 22–32)
CREATININE: 0.57 mg/dL (ref 0.44–1.00)
Chloride: 100 mmol/L (ref 98–111)
GFR calc non Af Amer: >60 mL/min (ref >60)
Glucose, Bld: 165 mg/dL — ABNORMAL HIGH (ref 70–99)
Potassium: 3.7 mmol/L (ref 3.5–5.1)
SODIUM: 138 mmol/L (ref 135–145)
Total Protein: 7.9 g/dL (ref 6.5–8.1)

## 2017-12-14 LAB — URINALYSIS, ROUTINE W REFLEX MICROSCOPIC
BILIRUBIN URINE: NEGATIVE
Glucose, UA: >=500 mg/dL — AB
KETONES UR: 80 mg/dL — AB
Nitrite: NEGATIVE
Protein, ur: NEGATIVE mg/dL
Specific Gravity, Urine: 1.009 (ref 1.005–1.030)
pH: 9 — ABNORMAL HIGH (ref 5.0–8.0)

## 2017-12-14 LAB — CBC
HCT: 40.6 % (ref 36.0–46.0)
Hemoglobin: 12.8 g/dL (ref 12.0–15.0)
MCH: 28.5 pg (ref 26.0–34.0)
MCHC: 31.5 g/dL (ref 30.0–36.0)
MCV: 90.4 fL (ref 80.0–100.0)
Platelets: 526 10*3/uL — ABNORMAL HIGH (ref 150–400)
RBC: 4.49 MIL/uL (ref 3.87–5.11)
RDW: 14.2 % (ref 11.5–15.5)
WBC: 8.8 10*3/uL (ref 4.0–10.5)
nRBC: 0 % (ref 0.0–0.2)

## 2017-12-14 MED ORDER — PROMETHAZINE HCL 25 MG RE SUPP: 25.0000 mg | Freq: Four times a day (QID) | RECTAL | 0 refills | Status: DC | PRN

## 2017-12-14 MED ORDER — ONDANSETRON 4 MG PO TBDP: ORAL_TABLET | ORAL | 0 refills | Status: DC

## 2017-12-14 MED ORDER — SODIUM CHLORIDE 0.9 % IV BOLUS
1000.0000 mL | Freq: Once | INTRAVENOUS | Status: AC
  Administered 2017-12-14: 1000 mL via INTRAVENOUS

## 2017-12-14 MED ORDER — PROCHLORPERAZINE EDISYLATE 10 MG/2ML IJ SOLN
10.0000 mg | Freq: Once | INTRAMUSCULAR | Status: AC
  Administered 2017-12-14: 10 mg via INTRAVENOUS
  Filled 2017-12-14: qty 2

## 2017-12-14 MED ORDER — SODIUM CHLORIDE 0.9 % IV BOLUS
500.0000 mL | Freq: Once | INTRAVENOUS | Status: AC
  Administered 2017-12-14: 500 mL via INTRAVENOUS

## 2017-12-14 MED ORDER — PROMETHAZINE HCL 25 MG/ML IJ SOLN
25.0000 mg | Freq: Once | INTRAMUSCULAR | Status: AC
  Administered 2017-12-14: 25 mg via INTRAVENOUS
  Filled 2017-12-14: qty 1

## 2017-12-14 MED ORDER — MORPHINE SULFATE (PF) 4 MG/ML IV SOLN
4.0000 mg | Freq: Once | INTRAVENOUS | Status: AC
  Administered 2017-12-14: 4 mg via INTRAVENOUS
  Filled 2017-12-14: qty 1

## 2017-12-14 NOTE — ED Triage Notes (Signed)
Pt reports emesis that she attributes to gastroparesis. She reports that she was just discharged from inpatient 2 days ago. A&Ox4. Has used zofran without improvement.

## 2017-12-14 NOTE — ED Provider Notes (Signed)
[facility] DEPT Provider Note   CSN: [id number] Arrival date & time: 12/14/17  1717     History   Chief Complaint Chief Complaint  Patient presents with  . Abdominal Pain    HPI [name] is a 27 y.o. female.  27 year old female with history of DM gastroparesis. She was recently discharged (two days ago) after after an inpatient admission. She is writhing about in the bed, appears uncomfortable. She is complaining of generalized abdominal pain consistent with prior episodes of gastroparesis. She is actively vomiting.  The history is provided by the patient. No language interpreter was used.  Abdominal Pain   This is a recurrent problem. The current episode started 6 to 12 hours ago. The problem occurs constantly. The problem has been rapidly worsening. The pain is located in the generalized abdominal region. The pain is severe. Associated symptoms include nausea and vomiting.    Past Medical History:  Diagnosis Date  . Anxiety   . Arthritis    "hands, feet, knees" (12/18/2016)  . Asthma   . Diabetic gastroparesis ([place])    Per gastric emptying study 07/09/16 which showed significant delayed gastric emptying.  . Gallstones   . Gastroparesis   . GERD (gastroesophageal reflux disease)   . Heart murmur   . Hepatic steatosis 11/26/2014   and hepatomegaly  . Hypertension    hx (12/18/2016)  . Intractable cyclical vomiting syndrome    Archie Endo 12/18/2016  . Liver mass 11/26/2014  . Pancreatitis, acute 11/26/2014  . Pneumonia    "as a teen X 1" (12/18/2016)  . Type I diabetes mellitus ([place]) 2007   IDDM.  poorly controlled, multiple admits with DKA    Patient Active Problem List   Diagnosis Date Noted  . Diffuse pain   . Elevated blood pressure reading 12/03/2017  . Tachycardia 11/30/2017  . Intractable vomiting with nausea 11/15/2017  . Hypernatremia 11/02/2017  . Nausea and vomiting 01/22/2017  . Nausea & vomiting 01/21/2017  .  Intractable cyclical vomiting syndrome 12/18/2016  . Leukocytosis 10/24/2016  . N&V (nausea and vomiting) 10/23/2016  . DKA, type 1 ([place]) 09/26/2016  . Thrombocytosis ([place]) 07/08/2016  . Candiduria, asymptomatic 06/23/2016  . Hyponatremia 06/13/2016  . Hematuria 05/13/2016  . UTI (urinary tract infection) 01/22/2016  . Diarrhea 11/09/2015  . Acute urinary retention   . Gastroparesis due to DM ([place]) 07/10/2015  . GERD (gastroesophageal reflux disease) 07/10/2015  . Depression with anxiety 07/10/2015  . Gastroparesis 06/22/2015  . Altered mental status 06/22/2015  . Volume depletion 06/10/2015  . Protein-calorie malnutrition, severe 06/10/2015  . Hyperglycemia   . Elevated bilirubin   . Hematemesis with nausea   . Intractable nausea and vomiting 05/28/2015  . Abdominal pain in female   . Abdominal pain 05/24/2015  . Hypertension 05/24/2015  . Dehydration   . Diabetic gastroparesis ([place])   . Chronic diastolic heart failure ([place]) 04/11/2015  . Hematemesis 04/08/2015  . DKA (diabetic ketoacidoses) ([place]) 03/22/2015  . S/P laparoscopic cholecystectomy 02/11/2015  . Postextubation stridor   . Pancreatitis, acute 11/26/2014  . Volume overload 11/26/2014  . Hypokalemia 11/26/2014  . Hepatic steatosis 11/26/2014  . Liver mass 11/26/2014  . Sepsis ([place]) 11/25/2014  . Sinus tachycardia 11/25/2014  . Hypomagnesemia 11/25/2014  . Hypophosphatemia 11/25/2014  . Elevated LFTs 11/24/2014  . AKI (acute kidney injury) ([place]) 11/24/2014  . Migraine headache 11/24/2014  . Asthma 06/29/2012  . Uncontrolled type 1 diabetes mellitus ([place]) 06/19/2010  . Goiter, unspecified  06/19/2010    Past Surgical History:  Procedure Laterality Date  . CHOLECYSTECTOMY N/A 02/11/2015   Procedure: LAPAROSCOPIC CHOLECYSTECTOMY WITH INTRAOPERATIVE CHOLANGIOGRAM;  Surgeon: Greer Pickerel, MD;  Location: [facility]S;  Service: General;  Laterality: N/A;  . ESOPHAGOGASTRODUODENOSCOPY (EGD) WITH PROPOFOL Left 09/20/2014    Procedure: ESOPHAGOGASTRODUODENOSCOPY (EGD) WITH PROPOFOL;  Surgeon: Arta Silence, MD;  Location: [facility] ENDOSCOPY;  Service: Endoscopy;  Laterality: Left;  . WISDOM TOOTH EXTRACTION       OB History    Gravida  2   Para  1   Term  0   Preterm  1   AB  1   Living  1     SAB  0   TAB  1   Ectopic  0   Multiple  0   Live Births  1            Home Medications    Prior to Admission medications   Medication Sig Start Date End Date Taking? Authorizing Provider  albuterol (PROVENTIL HFA;VENTOLIN HFA) 108 (90 Base) MCG/ACT inhaler Inhale 1-2 puffs into the lungs every 6 (six) hours as needed for wheezing or shortness of breath.    [provider]  blood glucose meter kit and supplies Dispense based on patient and insurance preference. Use up to four times daily as directed. (FOR ICD-10 E10.9, E11.9). 12/07/17   Eugenie Filler, MD  dicyclomine (BENTYL) 10 MG capsule Take 1 capsule (10 mg total) by mouth 3 (three) times daily as needed for spasms. 12/07/17   Eugenie Filler, MD  glucagon (GLUCAGON EMERGENCY) 1 MG injection Inject 1 mg into the muscle once as needed (For very low blood sugars. May repeat in 15-20 minutes if needed.). 12/20/16   Hongalgi, Lenis Dickinson, MD  insulin aspart (NOVOLOG FLEXPEN) 100 UNIT/ML FlexPen Use with mealtime insulin based on 1:15 carb count as well as correctional insulin per sliding scale (see below) Blood sugar <120 = 0 units Blood sugar 121-150 = 1 unit Blood sugar 150-200 = 2 units Blood sugar 201-250 = 3 units Blood sugar 251-300 = 5 units Blood sugar 301-350= 7 units Blood sugar 351 - 400 = 9 units Over 400 call your doctor 12/11/17   Elodia Florence., MD  Insulin Glargine (LANTUS) 100 UNIT/ML Solostar Pen Inject 25 Units into the skin daily. 12/11/17   Elodia Florence., MD  Insulin Pen Needle 31G X 5 MM MISC 30 Units by Does not apply route at bedtime. 12/07/17   Eugenie Filler, MD  metoCLOPramide (REGLAN) 10  MG tablet Take 1 tablet (10 mg total) by mouth 3 (three) times daily with meals. 11/07/17 11/07/18  Debbe Odea, MD  ondansetron (ZOFRAN ODT) 4 MG disintegrating tablet Take 1 tablet (4 mg total) by mouth every 8 (eight) hours as needed for nausea or vomiting. Patient not taking: Reported on 12/08/2017 11/07/17   Debbe Odea, MD  pantoprazole (PROTONIX) 40 MG tablet Take 1 tablet (40 mg total) by mouth daily as needed for heartburn or indigestion. 12/07/17   Eugenie Filler, MD  polyethylene glycol [facility] / Floria Raveling) packet Take 17 g by mouth daily as needed for mild constipation. Patient not taking: Reported on 11/29/2017 11/07/17   Debbe Odea, MD  promethazine (PHENERGAN) 50 MG tablet Take 0.5 tablets (25 mg total) by mouth every 6 (six) hours as needed for nausea or vomiting. Patient not taking: Reported on 12/08/2017 11/07/17   Debbe Odea, MD  scopolamine (TRANSDERM-SCOP) 1 MG/3DAYS Place  1 patch (1.5 mg total) onto the skin every 3 (three) days as needed (during episodes of vomiting). 11/21/17   Debbe Odea, MD  tamsulosin (FLOMAX) 0.4 MG CAPS capsule Take 1 capsule (0.4 mg total) by mouth daily after breakfast. Patient not taking: Reported on 12/08/2017 12/08/17   Eugenie Filler, MD  traMADol (ULTRAM) 50 MG tablet Take 1-2 tablets (50-100 mg total) by mouth every 6 (six) hours as needed. Patient not taking: Reported on 12/08/2017 11/07/17   Debbe Odea, MD    Family History Family History  Problem Relation Age of Onset  . Heart disease Maternal Grandmother   . Heart disease Maternal Grandfather   . Diabetes Mother   . Hyperlipidemia Mother   . Hypertension Father   . Heart disease Father   . Hypertension Paternal Grandmother   . Cancer Paternal Grandfather     Social History Social History   Tobacco Use  . Smoking status: Never Smoker  . Smokeless tobacco: Never Used  Substance Use Topics  . Alcohol use: No  . Drug use: Yes    Types: Marijuana     Comment: 12/18/2016 q few days"     Allergies   Peanut-containing drug products; Food; and Ultram [tramadol]   Review of Systems Review of Systems  Gastrointestinal: Positive for abdominal pain, nausea and vomiting.  All other systems reviewed and are negative.    Physical Exam Updated Vital Signs BP (!) 139/99   Pulse (!) 126   Temp 98.8 F (37.1 C) (Oral)   Resp 19   LMP 12/06/2017 (Approximate)   SpO2 100%   Physical Exam  Constitutional: She is oriented to person, place, and time. She appears well-developed and well-nourished. She appears distressed.  HENT:  Head: Normocephalic.  Eyes: Pupils are equal, round, and reactive to light.  Neck: Neck supple.  Cardiovascular: Regular rhythm. Tachycardia present.  Pulmonary/Chest: Effort normal and breath sounds normal.  Abdominal: Soft. She exhibits no distension. There is tenderness.  Musculoskeletal: Normal range of motion.  Neurological: She is alert and oriented to person, place, and time.  Skin: Skin is warm and dry.  Psychiatric: She has a normal mood and affect.  Nursing note and vitals reviewed.    ED Treatments / Results  Labs (all labs ordered are listed, but only abnormal results are displayed) Labs Reviewed  COMPREHENSIVE METABOLIC PANEL - Abnormal; Notable for the following components:      Result Value   Glucose, Bld 165 (*)    All other components within normal limits  CBC - Abnormal; Notable for the following components:   Platelets 526 (*)    All other components within normal limits  URINALYSIS, ROUTINE W REFLEX MICROSCOPIC - Abnormal; Notable for the following components:   APPearance CLOUDY (*)    pH 9.0 (*)    Glucose, UA >=500 (*)    Hgb urine dipstick LARGE (*)    Ketones, ur 80 (*)    Leukocytes, UA MODERATE (*)    RBC / HPF >50 (*)    Bacteria, UA RARE (*)    All other components within normal limits  LIPASE, BLOOD  I-STAT BETA HCG BLOOD, ED ([facility], [facility], [facility] ONLY)     EKG None  Radiology No results found.  Procedures Procedures (including critical care time)  Medications Ordered in ED Medications  sodium chloride 0.9 % bolus 1,000 mL (has no administration in time range)  promethazine (PHENERGAN) injection 25 mg (has no administration in time range)  morphine 4  MG/ML injection 4 mg (has no administration in time range)     Initial Impression / Assessment and Plan / ED Course  I have reviewed the triage vital signs and the nursing notes.  Pertinent labs & imaging results that were available during my care of the patient were reviewed by me and considered in my medical decision making (see chart for details).     Labs reviewed. Urine shows large leukocytes, rare bacteria. She is spilling glucose into the urine, but blood glucose is 165. Patient denies urinary symptoms or pelvic pain. She is not sexually active.  Symptoms improved after IV fluids and medication. Patient remains mildly tachycardic. She is not hypotensive.  Patient is nontoxic, nonseptic appearing. Patient's pain and other symptoms adequately managed in emergency department.  Fluid bolus given.  Labs, imaging and vitals reviewed.  Patient does not meet the SIRS or Sepsis criteria.  On repeat exam patient does not have a surgical abdomin and there are no peritoneal signs.    Patient discharged home with symptomatic treatment and given strict instructions for follow-up with their primary care physician.  I have also discussed reasons to return immediately to the ER.  Patient expresses understanding and agrees with plan.    Final Clinical Impressions(s) / ED Diagnoses   Final diagnoses:  Gastroparesis    ED Discharge Orders         Ordered    promethazine (PHENERGAN) 25 MG suppository  Every 6 hours PRN     12/14/17 2239    ondansetron (ZOFRAN ODT) 4 MG disintegrating tablet     12/14/17 2239           Etta Quill, NP 12/14/17 2324    Dorie Rank, MD 12/15/17  0009

## 2017-12-16 ENCOUNTER — Encounter (HOSPITAL_COMMUNITY): Payer: Self-pay | Admitting: Emergency Medicine

## 2017-12-16 ENCOUNTER — Emergency Department (HOSPITAL_COMMUNITY)
Admission: EM | Admit: 2017-12-16 | Discharge: 2017-12-16 | Disposition: A | Discharge status: Home / Self Care | Payer: Medicaid Other | Attending: Emergency Medicine | Admitting: Emergency Medicine

## 2017-12-16 DIAGNOSIS — Z794 Long term (current) use of insulin: Secondary | ICD-10-CM | POA: Insufficient documentation

## 2017-12-16 DIAGNOSIS — I5032 Chronic diastolic (congestive) heart failure: Secondary | ICD-10-CM | POA: Diagnosis not present

## 2017-12-16 DIAGNOSIS — J45909 Unspecified asthma, uncomplicated: Secondary | ICD-10-CM | POA: Diagnosis not present

## 2017-12-16 DIAGNOSIS — E109 Type 1 diabetes mellitus without complications: Secondary | ICD-10-CM | POA: Diagnosis not present

## 2017-12-16 DIAGNOSIS — R1084 Generalized abdominal pain: Secondary | ICD-10-CM

## 2017-12-16 DIAGNOSIS — I110 Hypertensive heart disease with heart failure: Secondary | ICD-10-CM | POA: Insufficient documentation

## 2017-12-16 DIAGNOSIS — E1043 Type 1 diabetes mellitus with diabetic autonomic (poly)neuropathy: Secondary | ICD-10-CM | POA: Diagnosis not present

## 2017-12-16 DIAGNOSIS — R112 Nausea with vomiting, unspecified: Secondary | ICD-10-CM | POA: Diagnosis not present

## 2017-12-16 DIAGNOSIS — Z79899 Other long term (current) drug therapy: Secondary | ICD-10-CM | POA: Insufficient documentation

## 2017-12-16 DIAGNOSIS — R319 Hematuria, unspecified: Secondary | ICD-10-CM | POA: Diagnosis not present

## 2017-12-16 DIAGNOSIS — Z91010 Allergy to peanuts: Secondary | ICD-10-CM | POA: Diagnosis not present

## 2017-12-16 LAB — CBC WITH DIFFERENTIAL/PLATELET
ABS IMMATURE GRANULOCYTES: 0.02 10*3/uL (ref 0.00–0.07)
Basophils Absolute: 0 10*3/uL (ref 0.0–0.1)
Basophils Relative: 0 %
Eosinophils Absolute: 0.1 10*3/uL (ref 0.0–0.5)
Eosinophils Relative: 1 %
HCT: 39.4 % (ref 36.0–46.0)
HEMOGLOBIN: 12.7 g/dL (ref 12.0–15.0)
Immature Granulocytes: 0 %
LYMPHS PCT: 27 %
Lymphs Abs: 2.2 10*3/uL (ref 0.7–4.0)
MCH: 28.9 pg (ref 26.0–34.0)
MCHC: 32.2 g/dL (ref 30.0–36.0)
MCV: 89.7 fL (ref 80.0–100.0)
Monocytes Absolute: 0.5 10*3/uL (ref 0.1–1.0)
Monocytes Relative: 6 %
NEUTROS ABS: 5.4 10*3/uL (ref 1.7–7.7)
NEUTROS PCT: 66 %
NRBC: 0 % (ref 0.0–0.2)
Platelets: 472 10*3/uL — ABNORMAL HIGH (ref 150–400)
RBC: 4.39 MIL/uL (ref 3.87–5.11)
RDW: 14.2 % (ref 11.5–15.5)
WBC: 8.2 10*3/uL (ref 4.0–10.5)

## 2017-12-16 LAB — COMPREHENSIVE METABOLIC PANEL
ALBUMIN: 4.4 g/dL (ref 3.5–5.0)
ALK PHOS: 83 U/L (ref 38–126)
ALT: 20 U/L (ref 0–44)
ANION GAP: 11 (ref 5–15)
AST: 53 U/L — ABNORMAL HIGH (ref 15–41)
BUN: 22 mg/dL — ABNORMAL HIGH (ref 6–20)
CALCIUM: 9.6 mg/dL (ref 8.9–10.3)
CHLORIDE: 102 mmol/L (ref 98–111)
CO2: 24 mmol/L (ref 22–32)
CREATININE: 0.69 mg/dL (ref 0.44–1.00)
GFR calc Af Amer: >60 mL/min (ref >60)
GFR calc non Af Amer: >60 mL/min (ref >60)
Glucose, Bld: 189 mg/dL — ABNORMAL HIGH (ref 70–99)
Potassium: 5.2 mmol/L — ABNORMAL HIGH (ref 3.5–5.1)
SODIUM: 137 mmol/L (ref 135–145)
Total Bilirubin: 1.9 mg/dL — ABNORMAL HIGH (ref 0.3–1.2)
Total Protein: 7.7 g/dL (ref 6.5–8.1)

## 2017-12-16 LAB — RAPID URINE DRUG SCREEN, HOSP PERFORMED
Amphetamines: NOT DETECTED
Barbiturates: NOT DETECTED
Benzodiazepines: NOT DETECTED
Cocaine: NOT DETECTED
Opiates: POSITIVE — AB
Tetrahydrocannabinol: NOT DETECTED

## 2017-12-16 LAB — URINALYSIS, ROUTINE W REFLEX MICROSCOPIC
Bilirubin Urine: NEGATIVE
Glucose, UA: >=500 mg/dL — AB
Ketones, ur: 80 mg/dL — AB
Nitrite: NEGATIVE
Protein, ur: 100 mg/dL — AB
RBC / HPF: >50 RBC/hpf — ABNORMAL HIGH (ref 0–5)
Specific Gravity, Urine: 1.014 (ref 1.005–1.030)
pH: 8 (ref 5.0–8.0)

## 2017-12-16 LAB — POTASSIUM: Potassium: 3.4 mmol/L — ABNORMAL LOW (ref 3.5–5.1)

## 2017-12-16 LAB — LIPASE, BLOOD: Lipase: 17 U/L (ref 11–51)

## 2017-12-16 LAB — CBG MONITORING, ED: Glucose-Capillary: 165 mg/dL — ABNORMAL HIGH (ref 70–99)

## 2017-12-16 LAB — POC URINE PREG, ED: Preg Test, Ur: NEGATIVE

## 2017-12-16 MED ORDER — SODIUM CHLORIDE 0.9 % IV BOLUS
1000.0000 mL | Freq: Once | INTRAVENOUS | Status: AC
  Administered 2017-12-16: 1000 mL via INTRAVENOUS

## 2017-12-16 MED ORDER — DICYCLOMINE HCL 10 MG/ML IM SOLN
20.0000 mg | Freq: Once | INTRAMUSCULAR | Status: AC
  Administered 2017-12-16: 20 mg via INTRAMUSCULAR
  Filled 2017-12-16: qty 2

## 2017-12-16 MED ORDER — CAPSAICIN 0.025 % EX CREA
TOPICAL_CREAM | Freq: Once | CUTANEOUS | Status: AC
  Administered 2017-12-16: 09:00:00 via TOPICAL
  Filled 2017-12-16: qty 60

## 2017-12-16 MED ORDER — HALOPERIDOL LACTATE 5 MG/ML IJ SOLN
2.0000 mg | Freq: Once | INTRAMUSCULAR | Status: AC
  Administered 2017-12-16: 2 mg via INTRAMUSCULAR
  Filled 2017-12-16: qty 1

## 2017-12-16 NOTE — ED Notes (Signed)
This Probation officer and x2 NT's attempted straight stick blood draw without success. This writer attempted to pull ordered blood lab off of patient's IV, but was unsuccessful. PA Shawn notified. [place] Blood draw to be attempted by Ali Lowe, RN

## 2017-12-16 NOTE — ED Provider Notes (Signed)
[facility] DEPT Provider Note   CSN: [id number] Arrival date & time: 12/16/17  0506     History   Chief Complaint Chief Complaint  Patient presents with  . Abdominal Cramping  . Emesis    HPI [name] is a 27 y.o. female.  HPI   [name] is a 27 y.o. female, with a history of diabetic gastroparesis, HTN, historically poorly controlled DM type I, presenting to the ED accompanied by her mother with abdominal pain beginning yesterday.  States this pain is consistent with her previous instances of gastroparesis.  Pain is generalized, 10/10, constant, with a vague description, nonradiating. Denies alcohol or illicit drug use.  Denies routine NSAID use. Denies fevers, hematemesis, diarrhea, hematochezia/melena, urinary symptoms, or any other complaints.   Past Medical History:  Diagnosis Date  . Anxiety   . Arthritis    "hands, feet, knees" (12/18/2016)  . Asthma   . Diabetic gastroparesis ([place])    Per gastric emptying study 07/09/16 which showed significant delayed gastric emptying.  . Gallstones   . Gastroparesis   . GERD (gastroesophageal reflux disease)   . Heart murmur   . Hepatic steatosis 11/26/2014   and hepatomegaly  . Hypertension    hx (12/18/2016)  . Intractable cyclical vomiting syndrome    Archie Endo 12/18/2016  . Liver mass 11/26/2014  . Pancreatitis, acute 11/26/2014  . Pneumonia    "as a teen X 1" (12/18/2016)  . Type I diabetes mellitus ([place]) 2007   IDDM.  poorly controlled, multiple admits with DKA    Patient Active Problem List   Diagnosis Date Noted  . Diffuse pain   . Elevated blood pressure reading 12/03/2017  . Tachycardia 11/30/2017  . Intractable vomiting with nausea 11/15/2017  . Hypernatremia 11/02/2017  . Nausea and vomiting 01/22/2017  . Nausea & vomiting 01/21/2017  . Intractable cyclical vomiting syndrome 12/18/2016  . Leukocytosis 10/24/2016  . N&V (nausea and vomiting) 10/23/2016  .  DKA, type 1 ([place]) 09/26/2016  . Thrombocytosis ([place]) 07/08/2016  . Candiduria, asymptomatic 06/23/2016  . Hyponatremia 06/13/2016  . Hematuria 05/13/2016  . UTI (urinary tract infection) 01/22/2016  . Diarrhea 11/09/2015  . Acute urinary retention   . Gastroparesis due to DM ([place]) 07/10/2015  . GERD (gastroesophageal reflux disease) 07/10/2015  . Depression with anxiety 07/10/2015  . Gastroparesis 06/22/2015  . Altered mental status 06/22/2015  . Volume depletion 06/10/2015  . Protein-calorie malnutrition, severe 06/10/2015  . Hyperglycemia   . Elevated bilirubin   . Hematemesis with nausea   . Intractable nausea and vomiting 05/28/2015  . Abdominal pain in female   . Abdominal pain 05/24/2015  . Hypertension 05/24/2015  . Dehydration   . Diabetic gastroparesis ([place])   . Chronic diastolic heart failure ([place]) 04/11/2015  . Hematemesis 04/08/2015  . DKA (diabetic ketoacidoses) ([place]) 03/22/2015  . S/P laparoscopic cholecystectomy 02/11/2015  . Postextubation stridor   . Pancreatitis, acute 11/26/2014  . Volume overload 11/26/2014  . Hypokalemia 11/26/2014  . Hepatic steatosis 11/26/2014  . Liver mass 11/26/2014  . Sepsis ([place]) 11/25/2014  . Sinus tachycardia 11/25/2014  . Hypomagnesemia 11/25/2014  . Hypophosphatemia 11/25/2014  . Elevated LFTs 11/24/2014  . AKI (acute kidney injury) ([place]) 11/24/2014  . Migraine headache 11/24/2014  . Asthma 06/29/2012  . Uncontrolled type 1 diabetes mellitus ([place]) 06/19/2010  . Goiter, unspecified 06/19/2010    Past Surgical History:  Procedure Laterality Date  . CHOLECYSTECTOMY N/A 02/11/2015   Procedure: LAPAROSCOPIC CHOLECYSTECTOMY WITH INTRAOPERATIVE CHOLANGIOGRAM;  Surgeon: Greer Pickerel, MD;  Location: [facility] OR[facility];  Service: General;  Laterality: N/A;  . ESOPHAGOGASTRODUODENOSCOPY (EGD) WITH PROPOFOL Left 09/20/2014   Procedure: ESOPHAGOGASTRODUODENOSCOPY (EGD) WITH PROPOFOL;  Surgeon: Arta Silence, MD;  Location: [facility] ENDOSCOPY;   Service: Endoscopy;  Laterality: Left;  . WISDOM TOOTH EXTRACTION       OB History    Gravida  2   Para  1   Term  0   Preterm  1   AB  1   Living  1     SAB  0   TAB  1   Ectopic  0   Multiple  0   Live Births  1            Home Medications    Prior to Admission medications   Medication Sig Start Date End Date Taking? Authorizing Provider  albuterol (PROVENTIL HFA;VENTOLIN HFA) 108 (90 Base) MCG/ACT inhaler Inhale 1-2 puffs into the lungs every 6 (six) hours as needed for wheezing or shortness of breath.    [provider]  blood glucose meter kit and supplies Dispense based on patient and insurance preference. Use up to four times daily as directed. (FOR ICD-10 E10.9, E11.9). 12/07/17   Eugenie Filler, MD  dicyclomine (BENTYL) 10 MG capsule Take 1 capsule (10 mg total) by mouth 3 (three) times daily as needed for spasms. 12/07/17   Eugenie Filler, MD  glucagon (GLUCAGON EMERGENCY) 1 MG injection Inject 1 mg into the muscle once as needed (For very low blood sugars. May repeat in 15-20 minutes if needed.). 12/20/16   Hongalgi, Lenis Dickinson, MD  insulin aspart (NOVOLOG FLEXPEN) 100 UNIT/ML FlexPen Use with mealtime insulin based on 1:15 carb count as well as correctional insulin per sliding scale (see below) Blood sugar <120 = 0 units Blood sugar 121-150 = 1 unit Blood sugar 150-200 = 2 units Blood sugar 201-250 = 3 units Blood sugar 251-300 = 5 units Blood sugar 301-350= 7 units Blood sugar 351 - 400 = 9 units Over 400 call your doctor Patient not taking: Reported on 12/14/2017 12/11/17   Elodia Florence., MD  Insulin Glargine (LANTUS) 100 UNIT/ML Solostar Pen Inject 25 Units into the skin daily. 12/11/17   Elodia Florence., MD  Insulin Human (INSULIN PUMP) SOLN Inject 0-9 each into the skin continuous. Novolog 1:15 carb count  BS<120= 0 units 121-150= 1 unit 150-200= 2 units 201-250= 3 units 251-300= 5 units 301-350= 7  units 351-400= 9 units >400 Call MD    [provider]  Insulin Pen Needle 31G X 5 MM MISC 30 Units by Does not apply route at bedtime. 12/07/17   Eugenie Filler, MD  metoCLOPramide (REGLAN) 10 MG tablet Take 1 tablet (10 mg total) by mouth 3 (three) times daily with meals. 11/07/17 11/07/18  Debbe Odea, MD  ondansetron (ZOFRAN ODT) 4 MG disintegrating tablet Take 1 tablet (4 mg total) by mouth every 8 (eight) hours as needed for nausea or vomiting. Patient not taking: Reported on 12/08/2017 11/07/17   Debbe Odea, MD  ondansetron (ZOFRAN ODT) 4 MG disintegrating tablet 81m ODT q6 hours prn nausea/vomit 12/14/17   SEtta Quill NP  pantoprazole (PROTONIX) 40 MG tablet Take 1 tablet (40 mg total) by mouth daily as needed for heartburn or indigestion. 12/07/17   TEugenie Filler MD  polyethylene glycol ([facility]/ GFloria Raveling packet Take 17 g by mouth daily as needed for mild constipation. Patient not taking: Reported on  11/29/2017 11/07/17   Debbe Odea, MD  promethazine (PHENERGAN) 25 MG suppository Place 1 suppository (25 mg total) rectally every 6 (six) hours as needed for nausea or vomiting. 12/14/17   Etta Quill, NP  promethazine (PHENERGAN) 50 MG tablet Take 0.5 tablets (25 mg total) by mouth every 6 (six) hours as needed for nausea or vomiting. Patient not taking: Reported on 12/08/2017 11/07/17   Debbe Odea, MD  scopolamine (TRANSDERM-SCOP) 1 MG/3DAYS Place 1 patch (1.5 mg total) onto the skin every 3 (three) days as needed (during episodes of vomiting). 11/21/17   Debbe Odea, MD  tamsulosin (FLOMAX) 0.4 MG CAPS capsule Take 1 capsule (0.4 mg total) by mouth daily after breakfast. Patient not taking: Reported on 12/08/2017 12/08/17   Eugenie Filler, MD  traMADol (ULTRAM) 50 MG tablet Take 1-2 tablets (50-100 mg total) by mouth every 6 (six) hours as needed. Patient not taking: Reported on 12/08/2017 11/07/17   Debbe Odea, MD    Family History Family  History  Problem Relation Age of Onset  . Heart disease Maternal Grandmother   . Heart disease Maternal Grandfather   . Diabetes Mother   . Hyperlipidemia Mother   . Hypertension Father   . Heart disease Father   . Hypertension Paternal Grandmother   . Cancer Paternal Grandfather     Social History Social History   Tobacco Use  . Smoking status: Never Smoker  . Smokeless tobacco: Never Used  Substance Use Topics  . Alcohol use: No  . Drug use: Yes    Types: Marijuana    Comment: 12/18/2016 q few days"     Allergies   Peanut-containing drug products; Food; and Ultram [tramadol]   Review of Systems Review of Systems  Constitutional: Negative for chills and fever.  Respiratory: Negative for shortness of breath.   Cardiovascular: Negative for chest pain.  Gastrointestinal: Positive for abdominal pain, nausea and vomiting. Negative for blood in stool, constipation and diarrhea.  Genitourinary: Negative for dysuria, flank pain and hematuria.  Musculoskeletal: Negative for back pain.  All other systems reviewed and are negative.    Physical Exam Updated Vital Signs BP (!) 122/102   Pulse (!) 113   Temp 98.3 F (36.8 C) (Oral)   Resp 18   LMP 12/06/2017 (Approximate)   SpO2 100%   Physical Exam  Constitutional: She appears well-developed and well-nourished. No distress.  Patient appears uncomfortable, rolling around on the bed.  HENT:  Head: Normocephalic.  Eyes: Conjunctivae are normal.  Neck: Neck supple.  Cardiovascular: Normal rate, regular rhythm, normal heart sounds and intact distal pulses.  Pulmonary/Chest: Effort normal and breath sounds normal. No respiratory distress.  Abdominal: Soft. There is no tenderness. There is no guarding.  Patient does not seem to be tender in any one point in her abdomen.  Musculoskeletal: She exhibits no edema.  Lymphadenopathy:    She has no cervical adenopathy.  Neurological: She is alert.  Skin: Skin is warm and dry.  She is not diaphoretic.  Psychiatric: She has a normal mood and affect. Her behavior is normal.  Nursing note and vitals reviewed.    ED Treatments / Results  Labs (all labs ordered are listed, but only abnormal results are displayed) Labs Reviewed  CBC WITH DIFFERENTIAL/PLATELET - Abnormal; Notable for the following components:      Result Value   Platelets 472 (*)    All other components within normal limits  COMPREHENSIVE METABOLIC PANEL - Abnormal; Notable for the following components:  Potassium 5.2 (*)    Glucose, Bld 189 (*)    BUN 22 (*)    AST 53 (*)    Total Bilirubin 1.9 (*)    All other components within normal limits  URINALYSIS, ROUTINE W REFLEX MICROSCOPIC - Abnormal; Notable for the following components:   APPearance TURBID (*)    Glucose, UA >=500 (*)    Hgb urine dipstick LARGE (*)    Ketones, ur 80 (*)    Protein, ur 100 (*)    Leukocytes, UA MODERATE (*)    RBC / HPF >50 (*)    Bacteria, UA FEW (*)    All other components within normal limits  RAPID URINE DRUG SCREEN, HOSP PERFORMED - Abnormal; Notable for the following components:   Opiates POSITIVE (*)    All other components within normal limits  POTASSIUM - Abnormal; Notable for the following components:   Potassium 3.4 (*)    All other components within normal limits  CBG MONITORING, ED - Abnormal; Notable for the following components:   Glucose-Capillary 165 (*)    All other components within normal limits  URINE CULTURE  LIPASE, BLOOD  POC URINE PREG, ED    EKG None  Radiology No results found.  Procedures Procedures (including critical care time)  Medications Ordered in ED Medications  sodium chloride 0.9 % bolus 1,000 mL (0 mLs Intravenous Stopped 12/16/17 0830)  dicyclomine (BENTYL) injection 20 mg (20 mg Intramuscular Given 12/16/17 0658)  haloperidol lactate (HALDOL) injection 2 mg (2 mg Intramuscular Given 12/16/17 0602)  capsaicin (ZOSTRIX) 0.025 % cream ( Topical Given  12/16/17 0925)  sodium chloride 0.9 % bolus 1,000 mL (0 mLs Intravenous Stopped 12/16/17 0945)     Initial Impression / Assessment and Plan / ED Course  I have reviewed the triage vital signs and the nursing notes.  Pertinent labs & imaging results that were available during my care of the patient were reviewed by me and considered in my medical decision making (see chart for details).  Clinical Course as of Dec 17 1238  Thu Dec 16, 2017  0830 Patient states her pain has resolved and she is feeling much better.   [SJ]  0830 Suspect this may be due to hemolysis.  We will recheck.  Potassium(!): 5.2 [SJ]  1038 Patient states she feels much better.  She continues to be pain-free.  She is tolerating PO.  Her pulse rate is 96.   [SJ]    Clinical Course User Index [SJ] Zander Ingham C, PA-C    Patient presents with abdominal pain and nausea/vomiting.  She states this presentation is consistent with previous gastroparesis presentations.  She has evidence of dehydration on her lab work, including ketonuria.  She was also tachycardic, which improved with IV fluids. She does have hematuria, but this seems to have been present for multiple past urinalyses and she does not have other urinary symptoms.  We will send a culture to assure no abnormal bacterial growth.  She is followed by urology for hematuria and recurrent UTIs. She was treated with IV fluids, Bentyl, Haldol, and capsaicin.  She showed improvement in her nausea and vomiting with the Haldol.  The capsaicin also seemed to especially help her abdominal discomfort. Return precautions discussed.  Patient voices understanding of these instructions, accepts the plan, and is comfortable with discharge.  Findings and plan of care discussed with Nat Christen, MD.    Final Clinical Impressions(s) / ED Diagnoses   Final diagnoses:  Generalized abdominal  pain    ED Discharge Orders    None       Layla Maw 12/16/17 1241    Nat Christen, MD 12/17/17 1124

## 2017-12-16 NOTE — ED Notes (Signed)
Patient requesting to rest. Will call out when ready to give urine sample.

## 2017-12-16 NOTE — ED Notes (Signed)
Iv team in room 

## 2017-12-16 NOTE — Discharge Instructions (Addendum)
Nausea, Vomiting, and Abdominal pain  Hand washing: Wash your hands throughout the day, but especially before and after touching the face, using the restroom, sneezing, coughing, or touching surfaces that have been coughed or sneezed upon. Hydration: Symptoms will be intensified and complicated by dehydration. Dehydration can also extend the duration of symptoms. Drink plenty of fluids and get plenty of rest. You should be drinking at least half a liter of water an hour to stay hydrated. Electrolyte drinks (ex. Gatorade, Powerade, Pedialyte) are also encouraged. You should be drinking enough fluids to make your urine light yellow, almost clear. If this is not the case, you are not drinking enough water. Please note that some of the treatments indicated below will not be effective if you are not adequately hydrated. Diet: Please concentrate on hydration, however, you may introduce food slowly.  Start with a clear liquid diet, progressed to a full liquid diet, and then bland solids as you are able. Pain or fever: May use Tylenol for pain or fever. Capsaicin: May apply the capsaicin to the painful area of the abdomen.  This is usually applied twice a day, as needed.  Use gloves and/or wash your hands thoroughly following the use of this medication.  Be sure to avoid touching the eyes or genital regions. Nausea/vomiting: Use the Zofran for nausea or vomiting. Bentyl: This medication is what is known as an antispasmodic and is intended to help reduce abdominal discomfort. Follow-up: Follow-up with a primary care provider on this matter. Return: Return should you develop a fever, bloody diarrhea, increased abdominal pain, uncontrolled vomiting, or any other major concerns.  For prescription assistance, may try using prescription discount sites or apps, such as goodrx.com  Your blood pressure was higher than normal today.  Please follow-up with a primary care provider on this matter.

## 2017-12-16 NOTE — ED Notes (Signed)
Attempted blood draws and attempt was unsuccessful. RN notified

## 2017-12-16 NOTE — ED Notes (Signed)
Lavender top sent to lab; Wille Glaser RN advised. [employer]

## 2017-12-16 NOTE — ED Notes (Signed)
Family in room  

## 2017-12-16 NOTE — ED Provider Notes (Signed)
MSE was initiated and I personally evaluated the patient and placed orders (if any) at  5:39 AM on November 67, 843.  27 year old female with a history of IDDM, diabetic gastroparesis, HTN presents to the ED for N/V, abdominal pain.  States that she was seen for these symptoms 2 days ago.  Her symptoms recurred around 2 AM today.  She has tried Phenergan at home without relief.  States that her abdominal pain is diffuse.  Had a normal bowel movement earlier yesterday.  Denies any fevers.  States that her symptoms feel similar to prior gastroparesis flares.  History of multiple admissions for similar complaints.  Also known noncompliant diabetic.  Patient writhing around in the bed, appears uncomfortable.  Appears chronically ill, but nontoxic.  Will order for patient to receive IV fluids.  She states that she is a "hard stick" for IV access. Will give initial doses of IM Haldol and Bentyl pending IV establishment.  The patient appears stable so that the remainder of the MSE may be completed by another provider.   Antonietta Breach, PA-C 12/16/17 0541    Shanon Rosser, MD 12/16/17 [id number]

## 2017-12-16 NOTE — ED Triage Notes (Signed)
Pt started feeling bad last night 2am, vomiting and lots of pain.

## 2017-12-16 NOTE — ED Notes (Signed)
Asked for urine  

## 2017-12-17 LAB — URINE CULTURE: Culture: 10000 — AB

## 2017-12-18 ENCOUNTER — Telehealth: Payer: Self-pay

## 2017-12-18 NOTE — Telephone Encounter (Signed)
No treatment for [facility] ED 12/16/17 Per Salome Arnt Pharm D

## 2017-12-19 ENCOUNTER — Encounter (HOSPITAL_COMMUNITY): Payer: Self-pay

## 2017-12-19 ENCOUNTER — Inpatient Hospital Stay (HOSPITAL_COMMUNITY)
Admission: EM | Admit: 2017-12-19 | Discharge: 2017-12-21 | DRG: 392 | Disposition: A | Discharge status: Home / Self Care | Payer: Medicaid Other | Attending: Internal Medicine | Admitting: Internal Medicine

## 2017-12-19 ENCOUNTER — Other Ambulatory Visit: Payer: Self-pay

## 2017-12-19 DIAGNOSIS — Z885 Allergy status to narcotic agent status: Secondary | ICD-10-CM

## 2017-12-19 DIAGNOSIS — Z794 Long term (current) use of insulin: Secondary | ICD-10-CM

## 2017-12-19 DIAGNOSIS — Z23 Encounter for immunization: Secondary | ICD-10-CM

## 2017-12-19 DIAGNOSIS — E876 Hypokalemia: Secondary | ICD-10-CM | POA: Diagnosis present

## 2017-12-19 DIAGNOSIS — J45909 Unspecified asthma, uncomplicated: Secondary | ICD-10-CM | POA: Diagnosis present

## 2017-12-19 DIAGNOSIS — K3184 Gastroparesis: Secondary | ICD-10-CM | POA: Diagnosis not present

## 2017-12-19 DIAGNOSIS — Z833 Family history of diabetes mellitus: Secondary | ICD-10-CM

## 2017-12-19 DIAGNOSIS — E1143 Type 2 diabetes mellitus with diabetic autonomic (poly)neuropathy: Secondary | ICD-10-CM | POA: Diagnosis present

## 2017-12-19 DIAGNOSIS — Z8349 Family history of other endocrine, nutritional and metabolic diseases: Secondary | ICD-10-CM

## 2017-12-19 DIAGNOSIS — R109 Unspecified abdominal pain: Secondary | ICD-10-CM | POA: Diagnosis present

## 2017-12-19 DIAGNOSIS — Z9641 Presence of insulin pump (external) (internal): Secondary | ICD-10-CM | POA: Diagnosis present

## 2017-12-19 DIAGNOSIS — E1043 Type 1 diabetes mellitus with diabetic autonomic (poly)neuropathy: Secondary | ICD-10-CM | POA: Diagnosis present

## 2017-12-19 DIAGNOSIS — Z79891 Long term (current) use of opiate analgesic: Secondary | ICD-10-CM

## 2017-12-19 DIAGNOSIS — Z91010 Allergy to peanuts: Secondary | ICD-10-CM

## 2017-12-19 DIAGNOSIS — K219 Gastro-esophageal reflux disease without esophagitis: Secondary | ICD-10-CM | POA: Diagnosis present

## 2017-12-19 DIAGNOSIS — Z91018 Allergy to other foods: Secondary | ICD-10-CM

## 2017-12-19 DIAGNOSIS — Z9049 Acquired absence of other specified parts of digestive tract: Secondary | ICD-10-CM

## 2017-12-19 DIAGNOSIS — R112 Nausea with vomiting, unspecified: Secondary | ICD-10-CM | POA: Diagnosis present

## 2017-12-19 DIAGNOSIS — Z8249 Family history of ischemic heart disease and other diseases of the circulatory system: Secondary | ICD-10-CM

## 2017-12-19 DIAGNOSIS — R000 Tachycardia, unspecified: Secondary | ICD-10-CM | POA: Diagnosis present

## 2017-12-19 DIAGNOSIS — Z79899 Other long term (current) drug therapy: Secondary | ICD-10-CM

## 2017-12-19 LAB — COMPREHENSIVE METABOLIC PANEL
ALT: 16 U/L (ref 0–44)
AST: 19 U/L (ref 15–41)
Albumin: 5.3 g/dL — ABNORMAL HIGH (ref 3.5–5.0)
Alkaline Phosphatase: 97 U/L (ref 38–126)
Anion gap: 14 (ref 5–15)
BUN: 19 mg/dL (ref 6–20)
CO2: 28 mmol/L (ref 22–32)
CREATININE: 0.71 mg/dL (ref 0.44–1.00)
Calcium: 10.5 mg/dL — ABNORMAL HIGH (ref 8.9–10.3)
Chloride: 96 mmol/L — ABNORMAL LOW (ref 98–111)
GFR calc Af Amer: >60 mL/min (ref >60)
GFR calc non Af Amer: >60 mL/min (ref >60)
Glucose, Bld: 174 mg/dL — ABNORMAL HIGH (ref 70–99)
Potassium: 3.2 mmol/L — ABNORMAL LOW (ref 3.5–5.1)
SODIUM: 138 mmol/L (ref 135–145)
Total Bilirubin: 1 mg/dL (ref 0.3–1.2)
Total Protein: 9.2 g/dL — ABNORMAL HIGH (ref 6.5–8.1)

## 2017-12-19 LAB — CBC WITH DIFFERENTIAL/PLATELET
Abs Immature Granulocytes: 0.01 10*3/uL (ref 0.00–0.07)
BASOS PCT: 1 %
Basophils Absolute: 0 10*3/uL (ref 0.0–0.1)
Eosinophils Absolute: 0.1 10*3/uL (ref 0.0–0.5)
Eosinophils Relative: 1 %
HCT: 46.1 % — ABNORMAL HIGH (ref 36.0–46.0)
Hemoglobin: 14.8 g/dL (ref 12.0–15.0)
Immature Granulocytes: 0 %
Lymphocytes Relative: 31 %
Lymphs Abs: 2.4 10*3/uL (ref 0.7–4.0)
MCH: 28.5 pg (ref 26.0–34.0)
MCHC: 32.1 g/dL (ref 30.0–36.0)
MCV: 88.7 fL (ref 80.0–100.0)
MONOS PCT: 4 %
Monocytes Absolute: 0.3 10*3/uL (ref 0.1–1.0)
Neutro Abs: 4.9 10*3/uL (ref 1.7–7.7)
Neutrophils Relative %: 63 %
Platelets: 639 10*3/uL — ABNORMAL HIGH (ref 150–400)
RBC: 5.2 MIL/uL — ABNORMAL HIGH (ref 3.87–5.11)
RDW: 14.1 % (ref 11.5–15.5)
WBC: 7.9 10*3/uL (ref 4.0–10.5)
nRBC: 0 % (ref 0.0–0.2)

## 2017-12-19 LAB — LIPASE, BLOOD: Lipase: 16 U/L (ref 11–51)

## 2017-12-19 LAB — GLUCOSE, CAPILLARY
GLUCOSE-CAPILLARY: 23 mg/dL — AB (ref 70–99)
Glucose-Capillary: 119 mg/dL — ABNORMAL HIGH (ref 70–99)
Glucose-Capillary: 193 mg/dL — ABNORMAL HIGH (ref 70–99)
Glucose-Capillary: 258 mg/dL — ABNORMAL HIGH (ref 70–99)
Glucose-Capillary: 59 mg/dL — ABNORMAL LOW (ref 70–99)

## 2017-12-19 LAB — I-STAT BETA HCG BLOOD, ED (MC, WL, AP ONLY): I-stat hCG, quantitative: <5 m[IU]/mL (ref <5)

## 2017-12-19 LAB — CBG MONITORING, ED: Glucose-Capillary: 169 mg/dL — ABNORMAL HIGH (ref 70–99)

## 2017-12-19 MED ORDER — METOCLOPRAMIDE HCL 5 MG/ML IJ SOLN
10.0000 mg | Freq: Once | INTRAMUSCULAR | Status: AC
  Administered 2017-12-19: 10 mg via INTRAVENOUS
  Filled 2017-12-19: qty 2

## 2017-12-19 MED ORDER — MORPHINE SULFATE (PF) 4 MG/ML IV SOLN
4.0000 mg | Freq: Once | INTRAVENOUS | Status: AC
  Administered 2017-12-19: 4 mg via INTRAVENOUS
  Filled 2017-12-19: qty 1

## 2017-12-19 MED ORDER — SODIUM CHLORIDE 0.9 % IV SOLN
Freq: Once | INTRAVENOUS | Status: AC
  Administered 2017-12-20: 04:00:00 via INTRAVENOUS

## 2017-12-19 MED ORDER — MORPHINE SULFATE (PF) 2 MG/ML IV SOLN
2.0000 mg | INTRAVENOUS | Status: DC | PRN
  Administered 2017-12-20 (×4): 2 mg via INTRAVENOUS
  Filled 2017-12-19 (×4): qty 1

## 2017-12-19 MED ORDER — INSULIN ASPART 100 UNIT/ML ~~LOC~~ SOLN: 0.0000 [IU] | Freq: Three times a day (TID) | SUBCUTANEOUS | Status: DC

## 2017-12-19 MED ORDER — DIPHENHYDRAMINE HCL 50 MG/ML IJ SOLN: 25.0000 mg | Freq: Four times a day (QID) | INTRAMUSCULAR | Status: DC | PRN

## 2017-12-19 MED ORDER — HALOPERIDOL LACTATE 5 MG/ML IJ SOLN
2.0000 mg | Freq: Once | INTRAMUSCULAR | Status: AC
  Administered 2017-12-19: 2 mg via INTRAVENOUS
  Filled 2017-12-19: qty 1

## 2017-12-19 MED ORDER — INSULIN ASPART 100 UNIT/ML ~~LOC~~ SOLN
0.0000 [IU] | SUBCUTANEOUS | Status: DC
  Administered 2017-12-19: 2 [IU] via SUBCUTANEOUS
  Administered 2017-12-19: 5 [IU] via SUBCUTANEOUS
  Administered 2017-12-20 (×2): 2 [IU] via SUBCUTANEOUS

## 2017-12-19 MED ORDER — GLUCOSE 4 G PO CHEW
CHEWABLE_TABLET | ORAL | Status: AC
  Administered 2017-12-19: 1
  Filled 2017-12-19: qty 1

## 2017-12-19 MED ORDER — DIPHENHYDRAMINE HCL 50 MG/ML IJ SOLN
25.0000 mg | Freq: Once | INTRAMUSCULAR | Status: AC
  Administered 2017-12-19: 25 mg via INTRAVENOUS
  Filled 2017-12-19: qty 1

## 2017-12-19 MED ORDER — ALBUTEROL SULFATE (2.5 MG/3ML) 0.083% IN NEBU: 2.5000 mg | INHALATION_SOLUTION | Freq: Four times a day (QID) | RESPIRATORY_TRACT | Status: DC | PRN

## 2017-12-19 MED ORDER — LORAZEPAM 0.5 MG PO TABS: 0.5000 mg | ORAL_TABLET | Freq: Four times a day (QID) | ORAL | Status: DC | PRN

## 2017-12-19 MED ORDER — INFLUENZA VAC SPLIT QUAD 0.5 ML IM SUSY
0.5000 mL | PREFILLED_SYRINGE | INTRAMUSCULAR | Status: AC
  Administered 2017-12-20: 0.5 mL via INTRAMUSCULAR
  Filled 2017-12-19: qty 0.5

## 2017-12-19 MED ORDER — PANTOPRAZOLE SODIUM 40 MG IV SOLR
40.0000 mg | INTRAVENOUS | Status: DC
  Administered 2017-12-19 – 2017-12-20 (×2): 40 mg via INTRAVENOUS
  Filled 2017-12-19 (×2): qty 40

## 2017-12-19 MED ORDER — LACTATED RINGERS IV BOLUS
1000.0000 mL | Freq: Once | INTRAVENOUS | Status: AC
  Administered 2017-12-19: 1000 mL via INTRAVENOUS

## 2017-12-19 MED ORDER — SODIUM CHLORIDE 0.9 % IV SOLN
Freq: Once | INTRAVENOUS | Status: AC
  Administered 2017-12-19: 16:00:00 via INTRAVENOUS

## 2017-12-19 MED ORDER — POTASSIUM CHLORIDE 10 MEQ/100ML IV SOLN
10.0000 meq | INTRAVENOUS | Status: DC
  Administered 2017-12-19: 10 meq via INTRAVENOUS
  Filled 2017-12-19: qty 100

## 2017-12-19 MED ORDER — ALBUTEROL SULFATE HFA 108 (90 BASE) MCG/ACT IN AERS: 1.0000 | INHALATION_SPRAY | Freq: Four times a day (QID) | RESPIRATORY_TRACT | Status: DC | PRN

## 2017-12-19 MED ORDER — ONDANSETRON HCL 4 MG/2ML IJ SOLN: 4.0000 mg | Freq: Four times a day (QID) | INTRAMUSCULAR | Status: DC | PRN

## 2017-12-19 MED ORDER — POTASSIUM CHLORIDE 10 MEQ/100ML IV SOLN
10.0000 meq | INTRAVENOUS | Status: AC
  Administered 2017-12-19 (×2): 10 meq via INTRAVENOUS
  Filled 2017-12-19 (×3): qty 100

## 2017-12-19 MED ORDER — LORAZEPAM 1 MG PO TABS
1.0000 mg | ORAL_TABLET | Freq: Once | ORAL | Status: AC
  Administered 2017-12-19: 1 mg via SUBLINGUAL
  Filled 2017-12-19: qty 1

## 2017-12-19 MED ORDER — DICYCLOMINE HCL 10 MG PO CAPS: 10.0000 mg | ORAL_CAPSULE | Freq: Three times a day (TID) | ORAL | Status: DC | PRN

## 2017-12-19 MED ORDER — ENOXAPARIN SODIUM 40 MG/0.4ML ~~LOC~~ SOLN
40.0000 mg | SUBCUTANEOUS | Status: DC
  Administered 2017-12-19 – 2017-12-20 (×2): 40 mg via SUBCUTANEOUS
  Filled 2017-12-19 (×2): qty 0.4

## 2017-12-19 MED ORDER — INSULIN ASPART 100 UNIT/ML ~~LOC~~ SOLN: 0.0000 [IU] | Freq: Every day | SUBCUTANEOUS | Status: DC

## 2017-12-19 NOTE — Progress Notes (Signed)
Hypoglycemic Event  CBG: 28 @1730   Treatment: 3 glucose tabs and 15 GM carbohydrate snack  Symptoms: None except pt verbalized "my sugar feels low"-                Pt suspended insulin pump until MD could be notified.  Follow-up CBG: Time: 1750 CBG Result: 59 w/no symptoms  1 glucose tab and 15 GM carbohydrate snack repeated @ 1752  Follow-up CBG: Time: 1815 CBG Result: 119 & pt reported feeling better. Reassurance given.   Possible Reasons for Event: Inadequate meal intake and Unknown  Comments/MD notified: Dr. Silas [place] notified via phone of hypoglycemic event and that pt had suspended insulin pump. Clarification order received to dc insulin pump. See other orders regarding CBG schedule and SSI orders entered by MD into Epic.     Derek Jack

## 2017-12-19 NOTE — ED Provider Notes (Signed)
[facility] DEPT Provider Note   CSN: [id number] Arrival date & time: 12/19/17  1150      History   Chief Complaint Chief Complaint  Patient presents with  . Abdominal Pain  . Emesis    HPI [name] is a 27 y.o. female.  The history is provided by the patient.  Abdominal Pain   This is a recurrent problem. The current episode started 2 days ago. The problem occurs constantly. The problem has not changed since onset.The pain is associated with eating (hx of gastroparesis). The pain is located in the generalized abdominal region. The quality of the pain is aching and dull. The pain is at a severity of 4/10. The pain is moderate. Associated symptoms include anorexia, nausea and vomiting. Pertinent negatives include fever, belching, diarrhea, flatus, hematochezia, melena, constipation, dysuria, frequency, hematuria, headaches, arthralgias and myalgias. The symptoms are aggravated by eating. Relieved by: phenergan/zofran.  Emesis   Associated symptoms include abdominal pain. Pertinent negatives include no arthralgias, no chills, no cough, no diarrhea, no fever, no headaches and no myalgias.    Past Medical History:  Diagnosis Date  . Anxiety   . Arthritis    "hands, feet, knees" (12/18/2016)  . Asthma   . Diabetic gastroparesis ([place])    Per gastric emptying study 07/09/16 which showed significant delayed gastric emptying.  . Gallstones   . Gastroparesis   . GERD (gastroesophageal reflux disease)   . Heart murmur   . Hepatic steatosis 11/26/2014   and hepatomegaly  . Hypertension    hx (12/18/2016)  . Intractable cyclical vomiting syndrome    Archie Endo 12/18/2016  . Liver mass 11/26/2014  . Pancreatitis, acute 11/26/2014  . Pneumonia    "as a teen X 1" (12/18/2016)  . Type I diabetes mellitus ([place]) 2007   IDDM.  poorly controlled, multiple admits with DKA    Patient Active Problem List   Diagnosis Date Noted  . Diffuse pain   .  Elevated blood pressure reading 12/03/2017  . Tachycardia 11/30/2017  . Intractable vomiting with nausea 11/15/2017  . Hypernatremia 11/02/2017  . Nausea and vomiting 01/22/2017  . Nausea & vomiting 01/21/2017  . Intractable cyclical vomiting syndrome 12/18/2016  . Leukocytosis 10/24/2016  . N&V (nausea and vomiting) 10/23/2016  . DKA, type 1 ([place]) 09/26/2016  . Thrombocytosis ([place]) 07/08/2016  . Candiduria, asymptomatic 06/23/2016  . Hyponatremia 06/13/2016  . Hematuria 05/13/2016  . UTI (urinary tract infection) 01/22/2016  . Diarrhea 11/09/2015  . Acute urinary retention   . Gastroparesis due to DM ([place]) 07/10/2015  . GERD (gastroesophageal reflux disease) 07/10/2015  . Depression with anxiety 07/10/2015  . Gastroparesis 06/22/2015  . Altered mental status 06/22/2015  . Volume depletion 06/10/2015  . Protein-calorie malnutrition, severe 06/10/2015  . Hyperglycemia   . Elevated bilirubin   . Hematemesis with nausea   . Intractable nausea and vomiting 05/28/2015  . Abdominal pain in female   . Abdominal pain 05/24/2015  . Hypertension 05/24/2015  . Dehydration   . Diabetic gastroparesis ([place])   . Chronic diastolic heart failure ([place]) 04/11/2015  . Hematemesis 04/08/2015  . DKA (diabetic ketoacidoses) ([place]) 03/22/2015  . S/P laparoscopic cholecystectomy 02/11/2015  . Postextubation stridor   . Pancreatitis, acute 11/26/2014  . Volume overload 11/26/2014  . Hypokalemia 11/26/2014  . Hepatic steatosis 11/26/2014  . Liver mass 11/26/2014  . Sepsis ([place]) 11/25/2014  . Sinus tachycardia 11/25/2014  . Hypomagnesemia 11/25/2014  . Hypophosphatemia 11/25/2014  . Elevated LFTs 11/24/2014  .  AKI (acute kidney injury) ([place]) 11/24/2014  . Migraine headache 11/24/2014  . Asthma 06/29/2012  . Uncontrolled type 1 diabetes mellitus ([place]) 06/19/2010  . Goiter, unspecified 06/19/2010    Past Surgical History:  Procedure Laterality Date  . CHOLECYSTECTOMY N/A 02/11/2015    Procedure: LAPAROSCOPIC CHOLECYSTECTOMY WITH INTRAOPERATIVE CHOLANGIOGRAM;  Surgeon: Greer Pickerel, MD;  Location: [facility] ORS;  Service: General;  Laterality: N/A;  . ESOPHAGOGASTRODUODENOSCOPY (EGD) WITH PROPOFOL Left 09/20/2014   Procedure: ESOPHAGOGASTRODUODENOSCOPY (EGD) WITH PROPOFOL;  Surgeon: Arta Silence, MD;  Location: [facility] END[facility]COPY;  Service: Endoscopy;  Laterality: Left;  . WISDOM TOOTH EXTRACTION       OB History    Gravida  2   Para  1   Term  0   Preterm  1   AB  1   Living  1     SAB  0   TAB  1   Ectopic  0   Multiple  0   Live Births  1            Home Medications    Prior to Admission medications   Medication Sig Start Date End Date Taking? Authorizing Provider  dicyclomine (BENTYL) 10 MG capsule Take 1 capsule (10 mg total) by mouth 3 (three) times daily as needed for spasms. 12/07/17  Yes Eugenie Filler, MD  insulin aspart (NOVOLOG FLEXPEN) 100 UNIT/ML FlexPen Use with mealtime insulin based on 1:15 carb count as well as correctional insulin per sliding scale (see below) Blood sugar <120 = 0 units Blood sugar 121-150 = 1 unit Blood sugar 150-200 = 2 units Blood sugar 201-250 = 3 units Blood sugar 251-300 = 5 units Blood sugar 301-350= 7 units Blood sugar 351 - 400 = 9 units Over 400 call your doctor 12/11/17  Yes Elodia Florence., MD  Insulin Human (INSULIN PUMP) SOLN Inject 0-9 each into the skin continuous. Novolog 1:15 carb count  BS<120= 0 units 121-150= 1 unit 150-200= 2 units 201-250= 3 units 251-300= 5 units 301-350= 7 units 351-400= 9 units >400 Call MD   Yes [provider]  metoCLOPramide (REGLAN) 10 MG tablet Take 1 tablet (10 mg total) by mouth 3 (three) times daily with meals. 11/07/17 11/07/18 Yes Debbe Odea, MD  ondansetron (ZOFRAN ODT) 4 MG disintegrating tablet Take 1 tablet (4 mg total) by mouth every 8 (eight) hours as needed for nausea or vomiting. 11/07/17  Yes Debbe Odea, MD  scopolamine  (TRANSDERM-SCOP) 1 MG/3DAYS Place 1 patch (1.5 mg total) onto the skin every 3 (three) days as needed (during episodes of vomiting). 11/21/17  Yes Debbe Odea, MD  albuterol (PROVENTIL HFA;VENTOLIN HFA) 108 (90 Base) MCG/ACT inhaler Inhale 1-2 puffs into the lungs every 6 (six) hours as needed for wheezing or shortness of breath.    [provider]  blood glucose meter kit and supplies Dispense based on patient and insurance preference. Use up to four times daily as directed. (FOR ICD-10 E10.9, E11.9). 12/07/17   Eugenie Filler, MD  glucagon (GLUCAGON EMERGENCY) 1 MG injection Inject 1 mg into the muscle once as needed (For very low blood sugars. May repeat in 15-20 minutes if needed.). 12/20/16   Hongalgi, Lenis Dickinson, MD  Insulin Glargine (LANTUS) 100 UNIT/ML Solostar Pen Inject 25 Units into the skin daily. Patient not taking: Reported on 12/19/2017 12/11/17   Elodia Florence., MD  Insulin Pen Needle 31G X 5 MM MISC 30 Units by Does not apply route at bedtime. 12/07/17  Eugenie Filler, MD  ondansetron (ZOFRAN ODT) 4 MG disintegrating tablet 101m ODT q6 hours prn nausea/vomit Patient not taking: Reported on 12/19/2017 12/14/17   SEtta Quill NP  pantoprazole (PROTONIX) 40 MG tablet Take 1 tablet (40 mg total) by mouth daily as needed for heartburn or indigestion. Patient not taking: Reported on 12/19/2017 12/07/17   TEugenie Filler MD  polyethylene glycol ([facility]/ GFloria Raveling packet Take 17 g by mouth daily as needed for mild constipation. Patient not taking: Reported on 11/29/2017 11/07/17   RDebbe Odea MD  promethazine (PHENERGAN) 25 MG suppository Place 1 suppository (25 mg total) rectally every 6 (six) hours as needed for nausea or vomiting. 12/14/17   SEtta Quill NP  promethazine (PHENERGAN) 50 MG tablet Take 0.5 tablets (25 mg total) by mouth every 6 (six) hours as needed for nausea or vomiting. Patient not taking: Reported on 12/08/2017 11/07/17   RDebbe Odea MD    tamsulosin ([facility] 0.4 MG CAPS capsule Take 1 capsule (0.4 mg total) by mouth daily after breakfast. Patient not taking: Reported on 12/19/2017 12/08/17   TEugenie Filler MD  traMADol (ULTRAM) 50 MG tablet Take 1-2 tablets (50-100 mg total) by mouth every 6 (six) hours as needed. Patient not taking: Reported on 12/19/2017 11/07/17   RDebbe Odea MD    Family History Family History  Problem Relation Age of Onset  . Heart disease Maternal Grandmother   . Heart disease Maternal Grandfather   . Diabetes Mother   . Hyperlipidemia Mother   . Hypertension Father   . Heart disease Father   . Hypertension Paternal Grandmother   . Cancer Paternal Grandfather     Social History Social History   Tobacco Use  . Smoking status: Never Smoker  . Smokeless tobacco: Never Used  Substance Use Topics  . Alcohol use: No  . Drug use: Yes    Types: Marijuana    Comment: 12/18/2016 q few days"     Allergies   Peanut-containing drug products; Food; and Ultram [tramadol]   Review of Systems Review of Systems  Constitutional: Negative for chills and fever.  HENT: Negative for ear pain and sore throat.   Eyes: Negative for pain and visual disturbance.  Respiratory: Negative for cough and shortness of breath.   Cardiovascular: Negative for chest pain and palpitations.  Gastrointestinal: Positive for abdominal pain, anorexia, nausea and vomiting. Negative for constipation, diarrhea, flatus, hematochezia and melena.  Genitourinary: Negative for dysuria, frequency and hematuria.  Musculoskeletal: Negative for arthralgias, back pain and myalgias.  Skin: Negative for color change and rash.  Neurological: Negative for seizures, syncope and headaches.  All other systems reviewed and are negative.    Physical Exam Updated Vital Signs  ED Triage Vitals  Enc Vitals Group     BP 12/19/17 1206 (!) 131/114     Pulse Rate 12/19/17 1206 (!) 128     Resp 12/19/17 1206 16     Temp 12/19/17 1208  98.2 F (36.8 C)     Temp Source 12/19/17 1208 Oral     SpO2 12/19/17 1204 100 %     Weight --      Height --      Head Circumference --      Peak Flow --      Pain Score 12/19/17 1431 10     Pain Loc --      Pain Edu? --      Excl. in [place] --     Physical Exam  Constitutional: She appears well-developed and well-nourished. She appears distressed.  HENT:  Head: Normocephalic and atraumatic.  Dry mucous membranes  Eyes: Pupils are equal, round, and reactive to light. Conjunctivae and EOM are normal.  Neck: Neck supple.  Cardiovascular: Normal rate, regular rhythm and normal heart sounds.  No murmur heard. Pulmonary/Chest: Effort normal and breath sounds normal. No respiratory distress.  Abdominal: Soft. There is generalized tenderness.  Musculoskeletal: She exhibits no edema.  Neurological: She is alert.  Skin: Skin is warm and dry. Capillary refill takes less than 2 seconds.  Psychiatric: She has a normal mood and affect.  Nursing note and vitals reviewed.    ED Treatments / Results  Labs (all labs ordered are listed, but only abnormal results are displayed) Labs Reviewed  CBC WITH DIFFERENTIAL/PLATELET - Abnormal; Notable for the following components:      Result Value   RBC 5.20 (*)    HCT 46.1 (*)    Platelets 639 (*)    All other components within normal limits  COMPREHENSIVE METABOLIC PANEL - Abnormal; Notable for the following components:   Potassium 3.2 (*)    Chloride 96 (*)    Glucose, Bld 174 (*)    Calcium 10.5 (*)    Total Protein 9.2 (*)    Albumin 5.3 (*)    All other components within normal limits  CBG MONITORING, ED - Abnormal; Notable for the following components:   Glucose-Capillary 169 (*)    All other components within normal limits  LIPASE, BLOOD  URINALYSIS, ROUTINE W REFLEX MICROSCOPIC  I-STAT BETA HCG BLOOD, ED ([facility], [facility], [facility] ONLY)    EKG EKG Interpretation  Date/Time:  Sunday December 19 2017 15:38:50 EST Ventricular Rate:  123 PR  Interval:    QRS Duration: 79 QT Interval:  309 QTC Calculation: 442 R Axis:   76 Text Interpretation:  Sinus tachycardia LAE, consider biatrial enlargement Nonspecific T abnormalities, diffuse leads Confirmed by Lennice Sites [phone]) on 12/19/2017 3:43:37 PM   Radiology No results found.  Procedures Procedures (including critical care time)  Medications Ordered in ED Medications  potassium chloride 10 mEq in 100 mL IVPB (10 mEq Intravenous New Bag/Given 12/19/17 1527)  haloperidol lactate (HALDOL) injection 2 mg (has no administration in time range)  lactated ringers bolus 1,000 mL (1,000 mLs Intravenous Bolus 12/19/17 1320)  metoCLOPramide (REGLAN) injection 10 mg (10 mg Intravenous Given 12/19/17 1319)  diphenhydrAMINE (BENADRYL) injection 25 mg (25 mg Intravenous Given 12/19/17 1320)  morphine 4 MG/ML injection 4 mg (4 mg Intravenous Given 12/19/17 1319)  LORazepam (ATIVAN) tablet 1 mg (1 mg Sublingual Given 12/19/17 1431)  0.9 %  sodium chloride infusion ( Intravenous New Bag/Given 12/19/17 1537)     Initial Impression / Assessment and Plan / ED Course  I have reviewed the triage vital signs and the nursing notes.  Pertinent labs & imaging results that were available during my care of the patient were reviewed by me and considered in my medical decision making (see chart for details).     Iona Stay is a 27 year old female with history of diabetic gastroparesis, hypertension, cyclical vomiting syndrome who presents to the ED with nausea, vomiting, abdominal pain.  Patient with overall unremarkable vitals.  Patient appears distressed.  Has had symptoms for the last 2 days.  Has not gotten better despite Phenergan and Zofran use at home.  Patient with normal blood sugar upon arrival.  Has general abdominal tenderness on exam but no signs of peritonitis.  Overall reassuring  vitals.  No fever.  Lab work showed mild hypokalemia but otherwise no significant anemia, leukocytosis,  kidney injury.  Patient with negative pregnancy test.  Lipase within normal limits doubt pancreatitis.  Gallbladder and liver enzymes also within normal limits.  Patient given IV fluid bolus, IV Benadryl, IV Reglan, p.o. Ativan with some improvement of her symptoms.  Also given IV morphine.  Patient despite medications and fluids still feels symptomatic.  Given her history of gastroparesis, cyclical vomiting syndrome will admit the patient for further treatment.  Patient started on maintenance fluids and given potassium repletion.  EKG ordered to evaluate for QTC.  This chart was dictated using voice recognition software.  Despite best efforts to proofread,  errors can occur which can change the documentation meaning.  Final Clinical Impressions(s) / ED Diagnoses   Final diagnoses:  Nausea and vomiting, intractability of vomiting not specified, unspecified vomiting type    ED Discharge Orders    None       Lennice Sites, DO 12/19/17 1543

## 2017-12-19 NOTE — ED Triage Notes (Addendum)
Patient c/o generalized abdominal achy pain since this morning. Patient c/o nausea and vomiting (6 occurences). Denies diarrhea.   Patient had a syncopal episode in triage for about 30 seconds.   P-128 BP-131/114  10/10 pain A/OX4 Ambulatory in triage.

## 2017-12-19 NOTE — H&P (Signed)
History and Physical  [name] QXI:[id number] DOB: May 15, 1990 DOA: 12/19/2017  Referring physician: Lennice Sites, DO PCP: Vicenta Aly, [place]  Outpatient Specialists: None Patient coming from: Home & is able to ambulate yes  Chief Complaint: Nausea vomiting abdominal pain for 3 days  HPI: [name] is a 27 y.o. female with medical history significant for type 1 diabetes mellitus on insulin pump, gastroparesis, GERD, cannabis related hyperemesis, previous DKA, who presented to the emergency department on November 29, 2017 December 08, 2017 December 14, 2017, December 16, 2017 and again today December 19, 2017 for nausea vomiting and abdominal pain.  Patient stated that she try to use her Zofran suppositories at home as well as Reglan and Phenergan but it did not help.  Patient stated present symptoms started about 2 days ago nothing makes it better.  When I went into the room she was sleeping soundly when I woke her up she says she is in so much pain and I asked her not before or now she said now.  She also has not had any appetite.  ED Course: Patient was given  Reglan, morphine,Ativan and Haldol and she had some relief of her pain and was sleeping when I got into the room also received IV fluid bolus x2.  Her potassium was low and was replaced with 10 mEq IV.  Review of Systems: . Pt complains of abdominal pain anorexia nausea vomiting  Pt denies any fever diarrhea headache dizziness.  Review of systems are otherwise negative   Past Medical History:  Diagnosis Date  . Anxiety   . Arthritis    "hands, feet, knees" (12/18/2016)  . Asthma   . Diabetic gastroparesis ([place])    Per gastric emptying study 07/09/16 which showed significant delayed gastric emptying.  . Gallstones   . Gastroparesis   . GERD (gastroesophageal reflux disease)   . Heart murmur   . Hepatic steatosis 11/26/2014   and hepatomegaly  . Hypertension    hx (12/18/2016)  . Intractable cyclical  vomiting syndrome    Archie Endo 12/18/2016  . Liver mass 11/26/2014  . Pancreatitis, acute 11/26/2014  . Pneumonia    "as a teen X 1" (12/18/2016)  . Type I diabetes mellitus ([place]) 2007   IDDM.  poorly controlled, multiple admits with DKA   Past Surgical History:  Procedure Laterality Date  . CHOLECYSTECTOMY N/A 02/11/2015   Procedure: LAPAROSCOPIC CHOLECYSTECTOMY WITH INTRAOPERATIVE CHOLANGIOGRAM;  Surgeon: Greer Pickerel, MD;  Location: [facility];  Service: General;  Laterality: N/A;  . ESOPHAGOGASTRODUODENOSCOPY (EGD) WITH PROPOFOL Left 09/20/2014   Procedure: ESOPHAGOGASTRODUODENOSCOPY (EGD) WITH PROPOFOL;  Surgeon: Arta Silence, MD;  Location: [facility] END[facility]COPY;  Service: Endoscopy;  Laterality: Left;  . WISDOM TOOTH EXTRACTION      Social History:  reports that she has never smoked. She has never used smokeless tobacco. She reports that she has current or past drug history. Drug: Marijuana. She reports that she does not drink alcohol.   Allergies  Allergen Reactions  . Peanut-Containing Drug Products Swelling and Other (See Comments)    Reaction:  Swelling of mouth and lips   . 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   Reaction:  Swelling of mouth and lips   . Ultram [Tramadol] Itching    Family History  Problem Relation Age of Onset  . Heart disease Maternal Grandmother   . Heart disease Maternal Grandfather   . Diabetes Mother   . Hyperlipidemia Mother   .  Hypertension Father   . Heart disease Father   . Hypertension Paternal Grandmother   . Cancer Paternal Grandfather       Prior to Admission medications   Medication Sig Start Date End Date Taking? Authorizing Provider  dicyclomine (BENTYL) 10 MG capsule Take 1 capsule (10 mg total) by mouth 3 (three) times daily as needed for spasms. 12/07/17  Yes Eugenie Filler, MD  insulin aspart (NOVOLOG FLEXPEN) 100 UNIT/ML FlexPen Use with mealtime insulin based on 1:15 carb count as well as  correctional insulin per sliding scale (see below) Blood sugar <120 = 0 units Blood sugar 121-150 = 1 unit Blood sugar 150-200 = 2 units Blood sugar 201-250 = 3 units Blood sugar 251-300 = 5 units Blood sugar 301-350= 7 units Blood sugar 351 - 400 = 9 units Over 400 call your doctor 12/11/17  Yes Elodia Florence., MD  Insulin Human (INSULIN PUMP) SOLN Inject 0-9 each into the skin continuous. Novolog 1:15 carb count  BS<120= 0 units 121-150= 1 unit 150-200= 2 units 201-250= 3 units 251-300= 5 units 301-350= 7 units 351-400= 9 units >400 Call MD   Yes [provider]  metoCLOPramide (REGLAN) 10 MG tablet Take 1 tablet (10 mg total) by mouth 3 (three) times daily with meals. 11/07/17 11/07/18 Yes Debbe Odea, MD  ondansetron (ZOFRAN ODT) 4 MG disintegrating tablet Take 1 tablet (4 mg total) by mouth every 8 (eight) hours as needed for nausea or vomiting. 11/07/17  Yes Debbe Odea, MD  scopolamine (TRANSDERM-SCOP) 1 MG/3DAYS Place 1 patch (1.5 mg total) onto the skin every 3 (three) days as needed (during episodes of vomiting). 11/21/17  Yes Debbe Odea, MD  albuterol (PROVENTIL HFA;VENTOLIN HFA) 108 (90 Base) MCG/ACT inhaler Inhale 1-2 puffs into the lungs every 6 (six) hours as needed for wheezing or shortness of breath.    [provider]  blood glucose meter kit and supplies Dispense based on patient and insurance preference. Use up to four times daily as directed. (FOR ICD-10 E10.9, E11.9). 12/07/17   Eugenie Filler, MD  glucagon (GLUCAGON EMERGENCY) 1 MG injection Inject 1 mg into the muscle once as needed (For very low blood sugars. May repeat in 15-20 minutes if needed.). 12/20/16   Hongalgi, Lenis Dickinson, MD  Insulin Glargine (LANTUS) 100 UNIT/ML Solostar Pen Inject 25 Units into the skin daily. Patient not taking: Reported on 12/19/2017 12/11/17   Elodia Florence., MD  Insulin Pen Needle 31G X 5 MM MISC 30 Units by Does not apply route at bedtime.  12/07/17   Eugenie Filler, MD  ondansetron (ZOFRAN ODT) 4 MG disintegrating tablet 77m ODT q6 hours prn nausea/vomit Patient not taking: Reported on 12/19/2017 12/14/17   SEtta Quill NP  pantoprazole (PROTONIX) 40 MG tablet Take 1 tablet (40 mg total) by mouth daily as needed for heartburn or indigestion. Patient not taking: Reported on 12/19/2017 12/07/17   TEugenie Filler MD  polyethylene glycol ([facility]/ GFloria Raveling packet Take 17 g by mouth daily as needed for mild constipation. Patient not taking: Reported on 11/29/2017 11/07/17   RDebbe Odea MD  promethazine (PHENERGAN) 25 MG suppository Place 1 suppository (25 mg total) rectally every 6 (six) hours as needed for nausea or vomiting. 12/14/17   SEtta Quill NP  promethazine (PHENERGAN) 50 MG tablet Take 0.5 tablets (25 mg total) by mouth every 6 (six) hours as needed for nausea or vomiting. Patient not taking: Reported on 12/08/2017 11/07/17   Rizwan,  Eunice Blase, MD  tamsulosin (FLOMAX) 0.4 MG CAPS capsule Take 1 capsule (0.4 mg total) by mouth daily after breakfast. Patient not taking: Reported on 12/19/2017 12/08/17   Eugenie Filler, MD  traMADol (ULTRAM) 50 MG tablet Take 1-2 tablets (50-100 mg total) by mouth every 6 (six) hours as needed. Patient not taking: Reported on 12/19/2017 11/07/17   Debbe Odea, MD    Physical Exam: BP (!) 133/96   Pulse (!) 123   Temp 98.2 F (36.8 C) (Oral)   Resp 18   LMP 12/06/2017 (Approximate)   SpO2 100%   Exam:  . General: 27 y.o. year-old female well developed well nourished in no acute distress.  Alert and oriented x3.  Slightly drowsy.  Easily aroused . Cardiovascular: Regular rate and rhythm with no rubs or gallops.  No thyromegaly or JVD noted.   [name] Respiratory: Clear to auscultation with no wheezes or rales. Good inspiratory effort. . Abdomen: Soft nontender nondistended with normal bowel sounds x4 quadrants. . Musculoskeletal: No lower extremity edema. 2/4 pulses in all 4  extremities. . Skin: No ulcerative lesions noted or rashes, . Psychiatry: Mood is appropriate for condition and setting           Labs on Admission:  Basic Metabolic Panel: Recent Labs  Lab 12/14/17 1749 12/16/17 0654 12/16/17 1036 12/19/17 1216  NA 138 137  --  138  K 3.7 5.2* 3.4* 3.2*  CL 100 102  --  96*  CO2 29 24  --  28  GLUCOSE 165* 189*  --  174*  BUN 14 22*  --  19  CREATININE 0.57 0.69  --  0.71  CALCIUM 9.4 9.6  --  10.5*   Liver Function Tests: Recent Labs  Lab 12/14/17 1749 12/16/17 0654 12/19/17 1216  AST 23 53* 19  ALT 19 20 16   ALKPHOS 87 83 97  BILITOT 0.6 1.9* 1.0  PROT 7.9 7.7 9.2*  ALBUMIN 4.4 4.4 5.3*   Recent Labs  Lab 12/14/17 1749 12/16/17 0654 12/19/17 1216  LIPASE 19 17 16    No results for input(s): AMMONIA in the last 168 hours. CBC: Recent Labs  Lab 12/14/17 1749 12/16/17 0654 12/19/17 1216  WBC 8.8 8.2 7.9  NEUTROABS  --  5.4 4.9  HGB 12.8 12.7 14.8  HCT 40.6 39.4 46.1*  MCV 90.4 89.7 88.7  PLT 526* 472* 639*   Cardiac Enzymes: No results for input(s): CKTOTAL, CKMB, CKMBINDEX, TROPONINI in the last 168 hours.  BNP (last 3 results) No results for input(s): BNP in the last 8760 hours.  ProBNP (last 3 results) No results for input(s): PROBNP in the last 8760 hours.  CBG: Recent Labs  Lab 12/16/17 0645 12/19/17 1206  GLUCAP 165* 169*    Radiological Exams on Admission: No results found.  EKG: Independently reviewed.  No change per ER MD  Assessment/Plan Present on Admission: . Abdominal pain . Diabetic gastroparesis ([place]) . Intractable nausea and vomiting . Hypokalemia . Sinus tachycardia . Gastroparesis due to DM [facility])  Principal Problem:   Diabetic gastroparesis ([place]) Active Problems:   Sinus tachycardia   Hypokalemia   Abdominal pain   Intractable nausea and vomiting   Gastroparesis due to DM ([place])  1.  Diabetic gastroparesis, patient given Reglan Haldol and Ativan Benadryl with some  relief.  We will continue IV hydration and antiemetics. 2.  Mild hypokalemia that has been repleted continue to monitor 3.  Cyclical vomiting syndrome patient is frequently seen at the ER. 4.  Mild tachycardia may be secondary to dehydration versus vomiting IV fluids 5.  Type 1 diabetes mellitus insulin-dependent on insulin pump her sugar level is not extremely elevated, her glucose level was 169 and white count was within normal limits  Severity of Illness: The appropriate patient status for this patient is OBSERVATION. Observation status is judged to be reasonable and necessary in order to provide the required intensity of service to ensure the patient's safety. The patient's presenting symptoms, physical exam findings, and initial radiographic and laboratory data in the context of their medical condition is felt to place them at decreased risk for further clinical deterioration. Furthermore, it is anticipated that the patient will be medically stable for discharge from the hospital within 2 midnights of admission. The following factors support the patient status of observation.   " The patient's presenting symptoms include intractable nausea vomiting. " The physical exam findings include abdominal tenderness nausea vomiting despite ER management with fluid and antiemetics. " The initial radiographic and laboratory data are mild hypokalemia.     DVT prophylaxis: Lovenox  Code Status: Full  Family Communication: None at bedside  Disposition Plan: When stable  Consults called: None  Admission status: Observation for IV hydration and antiemetics    Cristal Deer MD [facility] Pager [phone]  If 7PM-7AM, please contact night-coverage [email or web address] Password [facility]  12/19/2017, 4:27 PM

## 2017-12-19 NOTE — ED Notes (Signed)
ED TO INPATIENT HANDOFF REPORT  Name/Age/Gender [name] 27 y.o. female  Code Status Code Status History    Date Active Date Inactive Code Status Order ID Comments User Context   12/08/2017 1340 12/11/2017 1618 Full Code [id number]  Damita Lack, MD ED   11/29/2017 0635 12/07/2017 1840 Full Code [id number]  Rise Patience, MD Inpatient   11/15/2017 0503 11/21/2017 1747 Full Code [id number]  Rise Patience, MD Inpatient   11/02/2017 1757 11/07/2017 1354 Full Code [id number]  Charlynne Cousins, MD Inpatient   01/21/2017 2304 01/29/2017 1728 Full Code [id number]  Rise Patience, MD ED   12/18/2016 0704 12/20/2016 1727 Full Code [id number]  Rondel Jumbo, PA-C ED   11/05/2016 1525 11/11/2016 1456 Full Code [id number]  Phillips Grout, MD Inpatient   10/23/2016 1342 10/29/2016 2103 Full Code [id number]  Elwin Mocha, MD ED   09/26/2016 1503 09/29/2016 1533 Full Code [id number]  Georgette Shell, MD ED   07/15/2016 1712 07/18/2016 2035 Full Code [id number]  Damita Lack, MD Inpatient   07/05/2016 1658 07/11/2016 1620 Full Code [id number]  Bonnielee Haff, MD Inpatient   06/23/2016 0856 06/28/2016 1346 Full Code [id number]  Janece Canterbury, MD Inpatient   06/12/2016 2137 06/14/2016 1723 Full Code [id number]  Sid Falcon, MD Inpatient   06/03/2016 2044 06/06/2016 2041 Full Code [id number]  Gardiner Barefoot, NP Inpatient   05/31/2016 2348 06/03/2016 2044 Full Code [id number]  Rise Patience, MD Inpatient   05/14/2016 0212 05/16/2016 1718 Full Code [id number]  Toy Baker, MD Inpatient   05/04/2016 2140 05/10/2016 2004 Full Code [id number]  Orson Eva, MD Inpatient   02/29/2016 2201 03/04/2016 1646 Full Code [id number]  Toy Baker, MD Inpatient   02/20/2016 1441 02/25/2016 1723 Full Code [id number]  Elgergawy, Silver Huguenin, MD Inpatient   01/22/2016 1021 01/25/2016 1607 Full Code [id number]  Orson Eva, MD Inpatient   01/17/2016 0634 01/19/2016 1944 Full Code [id number]   Etta Quill, DO ED   11/18/2015 2238 11/23/2015 1231 Full Code [id number]  Bonnielee Haff, MD Inpatient   11/09/2015 2048 11/12/2015 1535 Full Code [id number]  Toy Baker, MD Inpatient   11/09/2015 2048 11/09/2015 2048 Full Code [id number]  Toy Baker, MD Inpatient   11/03/2015 0238 11/06/2015 1301 Full Code [id number]  Jani Gravel, MD Inpatient   11/02/2015 0128 11/03/2015 0238 Full Code [id number]  Phillips Grout, MD Inpatient   07/10/2015 1645 07/13/2015 1431 Full Code [id number]  Ivor Costa, MD ED   07/01/2015 2203 07/06/2015 1522 Full Code [id number]  Gennaro Africa, MD ED   06/22/2015 0624 06/24/2015 1531 Full Code [id number]  Etta Quill, DO Inpatient   06/10/2015 0156 06/11/2015 1519 Full Code [id number]  Reubin Milan, MD Inpatient   05/28/2015 1609 06/03/2015 1932 Full Code [id number]  Barton Dubois, MD Inpatient   05/24/2015 2359 05/25/2015 1858 Full Code [id number]  Theressa Millard, MD Inpatient   04/21/2015 0931 04/23/2015 1445 Full Code [id number]  Gennaro Africa, MD ED   04/08/2015 2229 04/15/2015 1605 Full Code [id number]  Reubin Milan, MD ED   03/22/2015 0243 03/25/2015 1811 Full Code [id number]  Rise Patience, MD ED   02/11/2015 1530 02/12/2015 1805 Full Code [id number]  Greer Pickerel, MD Inpatient   11/24/2014 1309 11/29/2014 1929 Full Code [id number]  Rama, Venetia Maxon, MD Inpatient   09/16/2014 0530 09/21/2014 2007 Full Code [id number]  Etta Quill, DO ED   06/29/2012 1400 07/01/2012 1517 Full Code [id number]  Velvet Bathe,  MD Inpatient   12/08/2011 1543 12/12/2011 1450 Full Code [id number]  Chyrel Masson, RN Inpatient      Home/SNF/Other Rehab  Chief Complaint emesis;abdominal pain  Level of Care/Admitting Diagnosis ED Disposition    ED Disposition Condition [place] [facility] Area: [facility] [[id number]]  Level of Care: Med-Surg [16]  Diagnosis: Gastroparesis due to DM [facility]) [[id number]]  Admitting Physician: Cristal Deer [[id number]]   Attending Physician: Cristal Deer [[id number]]  PT Class (Do Not Modify): Observation [104]  PT Acc Code (Do Not Modify): Observation [10022]       Medical History Past Medical History:  Diagnosis Date  . Anxiety   . Arthritis    "hands, feet, knees" (12/18/2016)  . Asthma   . Diabetic gastroparesis ([place])    Per gastric emptying study 07/09/16 which showed significant delayed gastric emptying.  . Gallstones   . Gastroparesis   . GERD (gastroesophageal reflux disease)   . Heart murmur   . Hepatic steatosis 11/26/2014   and hepatomegaly  . Hypertension    hx (12/18/2016)  . Intractable cyclical vomiting syndrome    Archie Endo 12/18/2016  . Liver mass 11/26/2014  . Pancreatitis, acute 11/26/2014  . Pneumonia    "as a teen X 1" (12/18/2016)  . Type I diabetes mellitus ([place]) 2007   IDDM.  poorly controlled, multiple admits with DKA    Allergies Allergies  Allergen Reactions  . Peanut-Containing Drug Products Swelling and Other (See Comments)    Reaction:  Swelling of mouth and lips   . 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   Reaction:  Swelling of mouth and lips   . Ultram [Tramadol] Itching    IV Location/Drains/Wounds Patient Lines/Drains/Airways Status   Active Line/Drains/Airways    Name:   Placement date:   Placement time:   Site:   Days:   Peripheral IV 12/16/17 Right;Anterior;Upper Arm   12/16/17    0657    Arm   3   Peripheral IV 12/16/17 Right Arm   12/16/17    0600    Arm   3   Peripheral IV 12/19/17 Right;Upper Arm   12/19/17    1318    Arm   less than 1          Labs/Imaging Results for orders placed or performed during the hospital encounter of 12/19/17 (from the past 48 hour(s))  CBG monitoring, ED     Status: Abnormal   Collection Time: 12/19/17 12:06 PM  Result Value Ref Range   Glucose-Capillary 169 (H) 70 - 99 mg/dL  CBC with Differential     Status: Abnormal   Collection Time: 12/19/17 12:16 PM  Result Value  Ref Range   WBC 7.9 4.0 - 10.5 K/uL   RBC 5.20 (H) 3.87 - 5.11 MIL/uL   Hemoglobin 14.8 12.0 - 15.0 g/dL   HCT 46.1 (H) 36.0 - 46.0 %   MCV 88.7 80.0 - 100.0 fL   MCH 28.5 26.0 - 34.0 pg   MCHC 32.1 30.0 - 36.0 g/dL   RDW 14.1 11.5 - 15.5 %   Platelets 639 (H) 150 - 400 K/uL   nRBC 0.0 0.0 - 0.2 %   Neutrophils Relative % 63 %   Neutro Abs 4.9 1.7 - 7.7 K/uL   Lymphocytes Relative 31 %   Lymphs Abs 2.4 0.7 - 4.0 K/uL   Monocytes Relative 4 %   Monocytes Absolute 0.3 0.1 -  1.0 K/uL   Eosinophils Relative 1 %   Eosinophils Absolute 0.1 0.0 - 0.5 K/uL   Basophils Relative 1 %   Basophils Absolute 0.0 0.0 - 0.1 K/uL   Immature Granulocytes 0 %   Abs Immature Granulocytes 0.01 0.00 - 0.07 K/uL    Comment: Performed at [facility], [place] [address] [place], [place] [address]  Comprehensive metabolic panel     Status: Abnormal   Collection Time: 12/19/17 12:16 PM  Result Value Ref Range   Sodium 138 135 - 145 mmol/L   Potassium 3.2 (L) 3.5 - 5.1 mmol/L   Chloride 96 (L) 98 - 111 mmol/L   CO2 28 22 - 32 mmol/L   Glucose, Bld 174 (H) 70 - 99 mg/dL   BUN 19 6 - 20 mg/dL   Creatinine, Ser 0.71 0.44 - 1.00 mg/dL   Calcium 10.5 (H) 8.9 - 10.3 mg/dL   Total Protein 9.2 (H) 6.5 - 8.1 g/dL   Albumin 5.3 (H) 3.5 - 5.0 g/dL   AST 19 15 - 41 U/L   ALT 16 0 - 44 U/L   Alkaline Phosphatase 97 38 - 126 U/L   Total Bilirubin 1.0 0.3 - 1.2 mg/dL   GFR calc non Af Amer >60 >60 mL/min   GFR calc Af Amer >60 >60 mL/min   Anion gap 14 5 - 15    Comment: Performed at [facility], [place] [address] [place], [place] [address]  Lipase, blood     Status: None   Collection Time: 12/19/17 12:16 PM  Result Value Ref Range   Lipase 16 11 - 51 U/L    Comment: Performed at [facility], [place] [address] [place], [place] [address]  I-Stat Beta hCG blood, ED ([facility], [facility], [facility] only)     Status: None   Collection Time: 12/19/17  1:25 PM  Result Value Ref  Range   I-stat hCG, quantitative <5.0 <5 mIU/mL   Comment 3            Comment:   GEST. AGE      CONC.  (mIU/mL)   <=1 WEEK        5 - 50     2 WEEKS       50 - 500     3 WEEKS       100 - 10,000     4 WEEKS     1,000 - 30,000        FEMALE AND NON-PREGNANT FEMALE:     LESS THAN 5 mIU/mL    No results found. EKG Interpretation  Date/Time:  Sunday December 19 2017 15:38:50 EST Ventricular Rate:  123 PR Interval:    QRS Duration: 79 QT Interval:  309 QTC Calculation: 442 R Axis:   76 Text Interpretation:  Sinus tachycardia LAE, consider biatrial enlargement Nonspecific T abnormalities, diffuse leads Confirmed by Adam, Curatolo ([id number]) on 12/19/2017 3:43:37 PM Also confirmed by Adam, Curatolo ([id number]), editor Cassel, Kerry ([id number])  on 12/19/2017 4:43:58 PM   Pending Labs Unresulted Labs (From admission, onward)    Start     Ordered   12/19/17 1216  Urinalysis, Routine w reflex microscopic  Once,   R     12 /01/19 1215   Signed and Held  CBC  (enoxaparin (LOVENOX)    CrCl >/= 30 ml/min)  Once,   R    Comments:  Baseline for enoxaparin therapy IF NOT ALREADY DRAWN.  Notify MD if PLT <  100 K.    Signed and Held   Signed and Held  Creatinine, serum  (enoxaparin (LOVENOX)    CrCl >/= 30 ml/min)  Once,   R    Comments:  Baseline for enoxaparin therapy IF NOT ALREADY DRAWN.    Signed and Held   Signed and Held  Creatinine, serum  (enoxaparin (LOVENOX)    CrCl >/= 30 ml/min)  Weekly,   R    Comments:  while on enoxaparin therapy    Signed and Held   Signed and Held  Basic metabolic panel  Tomorrow morning,   R     Signed and Held   Signed and Held  Comprehensive metabolic panel  Tomorrow morning,   R     Signed and Held          Vitals/Pain Today's Vitals   12/19/17 1208 12/19/17 1431 12/19/17 1539 12/19/17 1600  BP:   (!) 133/96 (!) 132/99  Pulse:   (!) 123 (!) 116  Resp:   18 17  Temp: 98.2 F (36.8 C)     TempSrc: Oral     SpO2:   100% 97%  PainSc:  10-Worst pain ever       Isolation Precautions No active isolations  Medications Medications  potassium chloride 10 mEq in 100 mL IVPB (10 mEq Intravenous New Bag/Given 12/19/17 1527)  lactated ringers bolus 1,000 mL (1,000 mLs Intravenous Bolus 12/19/17 1320)  metoCLOPramide (REGLAN) injection 10 mg (10 mg Intravenous Given 12/19/17 1319)  diphenhydrAMINE (BENADRYL) injection 25 mg (25 mg Intravenous Given 12/19/17 1320)  morphine 4 MG/ML injection 4 mg (4 mg Intravenous Given 12/19/17 1319)  LORazepam (ATIVAN) tablet 1 mg (1 mg Sublingual Given 12/19/17 1431)  0.9 %  sodium chloride infusion ( Intravenous New Bag/Given 12/19/17 1537)  haloperidol lactate (HALDOL) injection 2 mg (2 mg Intravenous Given 12/19/17 1602)    Mobility non-ambulatory currently

## 2017-12-19 NOTE — ED Notes (Signed)
Pt cannot use restroom at this time, aware urine specimen is needed.  

## 2017-12-20 DIAGNOSIS — E876 Hypokalemia: Secondary | ICD-10-CM

## 2017-12-20 DIAGNOSIS — R1013 Epigastric pain: Secondary | ICD-10-CM | POA: Diagnosis not present

## 2017-12-20 DIAGNOSIS — R000 Tachycardia, unspecified: Secondary | ICD-10-CM

## 2017-12-20 DIAGNOSIS — Z8249 Family history of ischemic heart disease and other diseases of the circulatory system: Secondary | ICD-10-CM | POA: Diagnosis not present

## 2017-12-20 DIAGNOSIS — R112 Nausea with vomiting, unspecified: Secondary | ICD-10-CM

## 2017-12-20 DIAGNOSIS — Z79899 Other long term (current) drug therapy: Secondary | ICD-10-CM | POA: Diagnosis not present

## 2017-12-20 DIAGNOSIS — Z79891 Long term (current) use of opiate analgesic: Secondary | ICD-10-CM | POA: Diagnosis not present

## 2017-12-20 DIAGNOSIS — Z23 Encounter for immunization: Secondary | ICD-10-CM | POA: Diagnosis not present

## 2017-12-20 DIAGNOSIS — Z91010 Allergy to peanuts: Secondary | ICD-10-CM | POA: Diagnosis not present

## 2017-12-20 DIAGNOSIS — Z794 Long term (current) use of insulin: Secondary | ICD-10-CM | POA: Diagnosis not present

## 2017-12-20 DIAGNOSIS — E1043 Type 1 diabetes mellitus with diabetic autonomic (poly)neuropathy: Secondary | ICD-10-CM | POA: Diagnosis present

## 2017-12-20 DIAGNOSIS — Z885 Allergy status to narcotic agent status: Secondary | ICD-10-CM | POA: Diagnosis not present

## 2017-12-20 DIAGNOSIS — K3184 Gastroparesis: Secondary | ICD-10-CM | POA: Diagnosis not present

## 2017-12-20 DIAGNOSIS — Z91018 Allergy to other foods: Secondary | ICD-10-CM | POA: Diagnosis not present

## 2017-12-20 DIAGNOSIS — Z9641 Presence of insulin pump (external) (internal): Secondary | ICD-10-CM | POA: Diagnosis present

## 2017-12-20 DIAGNOSIS — J45909 Unspecified asthma, uncomplicated: Secondary | ICD-10-CM | POA: Diagnosis present

## 2017-12-20 DIAGNOSIS — Z833 Family history of diabetes mellitus: Secondary | ICD-10-CM | POA: Diagnosis not present

## 2017-12-20 DIAGNOSIS — Z8349 Family history of other endocrine, nutritional and metabolic diseases: Secondary | ICD-10-CM | POA: Diagnosis not present

## 2017-12-20 DIAGNOSIS — Z9049 Acquired absence of other specified parts of digestive tract: Secondary | ICD-10-CM | POA: Diagnosis not present

## 2017-12-20 DIAGNOSIS — E1143 Type 2 diabetes mellitus with diabetic autonomic (poly)neuropathy: Secondary | ICD-10-CM | POA: Diagnosis not present

## 2017-12-20 DIAGNOSIS — K219 Gastro-esophageal reflux disease without esophagitis: Secondary | ICD-10-CM | POA: Diagnosis present

## 2017-12-20 LAB — GLUCOSE, CAPILLARY
GLUCOSE-CAPILLARY: 207 mg/dL — AB (ref 70–99)
Glucose-Capillary: 162 mg/dL — ABNORMAL HIGH (ref 70–99)
Glucose-Capillary: 309 mg/dL — ABNORMAL HIGH (ref 70–99)
Glucose-Capillary: 138 mg/dL — ABNORMAL HIGH (ref 70–99)
Glucose-Capillary: 82 mg/dL (ref 70–99)
Glucose-Capillary: 291 mg/dL — ABNORMAL HIGH (ref 70–99)

## 2017-12-20 LAB — COMPREHENSIVE METABOLIC PANEL
ALT: 11 U/L (ref 0–44)
AST: 19 U/L (ref 15–41)
Albumin: 3 g/dL — ABNORMAL LOW (ref 3.5–5.0)
Alkaline Phosphatase: 56 U/L (ref 38–126)
Anion gap: 8 (ref 5–15)
BUN: 12 mg/dL (ref 6–20)
CHLORIDE: 104 mmol/L (ref 98–111)
CO2: 27 mmol/L (ref 22–32)
Calcium: 8.5 mg/dL — ABNORMAL LOW (ref 8.9–10.3)
Creatinine, Ser: 0.7 mg/dL (ref 0.44–1.00)
GFR calc Af Amer: >60 mL/min (ref >60)
GFR calc non Af Amer: >60 mL/min (ref >60)
GLUCOSE: 162 mg/dL — AB (ref 70–99)
Potassium: 3.8 mmol/L (ref 3.5–5.1)
Sodium: 139 mmol/L (ref 135–145)
Total Bilirubin: 0.5 mg/dL (ref 0.3–1.2)
Total Protein: 5.4 g/dL — ABNORMAL LOW (ref 6.5–8.1)

## 2017-12-20 LAB — URINALYSIS, ROUTINE W REFLEX MICROSCOPIC
Bilirubin Urine: NEGATIVE
Glucose, UA: >=500 mg/dL — AB
Ketones, ur: 20 mg/dL — AB
Nitrite: NEGATIVE
Protein, ur: 100 mg/dL — AB
RBC / HPF: >50 RBC/hpf — ABNORMAL HIGH (ref 0–5)
Specific Gravity, Urine: 1.011 (ref 1.005–1.030)
WBC, UA: >50 WBC/hpf — ABNORMAL HIGH (ref 0–5)
pH: 8 (ref 5.0–8.0)

## 2017-12-20 MED ORDER — INSULIN PUMP
Freq: Three times a day (TID) | SUBCUTANEOUS | Status: DC
  Administered 2017-12-20 (×2): via SUBCUTANEOUS
  Administered 2017-12-20: 2.1 via SUBCUTANEOUS
  Filled 2017-12-20: qty 1

## 2017-12-20 NOTE — Progress Notes (Signed)
Called report to [place] on [address].

## 2017-12-20 NOTE — Progress Notes (Signed)
PROGRESS NOTE    [name]  QMV:[id number] DOB: [birth year] DOA: 12/19/2017 PCP: Vicenta Aly, FNP   Brief Narrative:  HPI On 12/19/2017 by Dr. Cristal Deer Hennessy Bartel is a 27 y.o. female with medical history significant for type 1 diabetes mellitus on insulin pump, gastroparesis, GERD, cannabis related hyperemesis, previous DKA, who presented to the emergency department on November 29, 2017 December 08, 2017 December 14, 2017, December 16, 2017 and again today December 19, 2017 for nausea vomiting and abdominal pain.  Patient stated that she try to use her Zofran suppositories at home as well as Reglan and Phenergan but it did not help.  Patient stated present symptoms started about 2 days ago nothing makes it better.  When I went into the room she was sleeping soundly when I woke her up she says she is in so much pain and I asked her not before or now she said now.  She also has not had any appetite.   Assessment & Plan   Abdominal pain secondary to Diabetic gastroparesis  -Patient has been using her Reglan, Zofran as well as Phenergan at home with no relief of her symptoms.  She does admit to running out of Protonix.  Follows up with Dr. Gillermina Hu. -Patient feels her abdominal pain has mildly improved but not completely resolved.  She would like to try eating. -Will place on soft diet and monitor -Continue IVF, antiemetics, PPI  Diabetes mellitus, Type I -Uses insulin pump -diabetes coordinator consulted -will restart pump today -CBGs appear to be controlled  Mild tachycardia -Suspect secondary to dehydration along with vomiting -Continue IVF  Hypokalemia -Likely secondary to GI losses -Resolved with replacement, continue to monitor  DVT Prophylaxis  lovenox  Code Status: Full  Family Communication: non at bedside  Disposition Plan: observation, likely discharge to home on 12/3 pending improvement of symptoms as ability to tolerate  diet  Consultants Non  Procedures  None  Antibiotics   Anti-infectives (From admission, onward)   None      Subjective:   [name] seen and examined today.  Continues to have some abdominal pain, but feels it has improved mildly. Would like to try to eat something. Denies current chest pain, shortness of breath, current vomiting, diarrhea, constipation, dizziness, headache.   Objective:   Vitals:   12/19/17 2052 12/20/17 0523 12/20/17 1317 12/20/17 1421  BP: 117/83 101/66 107/73 110/73  Pulse: (!) 102 96 (!) 106 (!) 103  Resp: 18 18 16 20   Temp: 98.3 F (36.8 C) 98.3 F (36.8 C) 98.5 F (36.9 C) 98.6 F (37 C)  TempSrc: Oral Oral Oral Oral  SpO2: 99% 98% 100% 100%  Weight:      Height:        Intake/Output Summary (Last 24 hours) at 12/20/2017 1427 Last data filed at 12/20/2017 1317 Gross per 24 hour  Intake 2785.16 ml  Output 1252 ml  Net 1533.16 ml   Filed Weights   12/19/17 1743  Weight: 54.4 kg    Exam  General: Well developed, well nourished, NAD, appears stated age  91: NCAT, mucous membranes moist.   Neck: Supple  Cardiovascular: S1 S2 auscultated, no murmurs, RRR  Respiratory: Clear to auscultation bilaterally with equal chest rise  Abdomen: Soft, mildly TTP, nondistended, + bowel sounds  Extremities: warm dry without cyanosis clubbing or edema  Neuro: AAOx3,nonfocal  Psych: Normal affect and demeanor   Data Reviewed: I have personally reviewed following labs and imaging studies  CBC: Recent Labs  Lab 12/14/17 1749 12/16/17 0654 12/19/17 1216  WBC 8.8 8.2 7.9  NEUTROABS  --  5.4 4.9  HGB 12.8 12.7 14.8  HCT 40.6 39.4 46.1*  MCV 90.4 89.7 88.7  PLT 526* 472* 627*   Basic Metabolic Panel: Recent Labs  Lab 12/14/17 1749 12/16/17 0654 12/16/17 1036 12/19/17 1216 12/20/17 0641  NA 138 137  --  138 139  K 3.7 5.2* 3.4* 3.2* 3.8  CL 100 102  --  96* 104  CO2 29 24  --  28 27  GLUCOSE 165* 189*  --  174* 162*   BUN 14 22*  --  19 12  CREATININE 0.57 0.69  --  0.71 0.70  CALCIUM 9.4 9.6  --  10.5* 8.5*   GFR: Estimated Creatinine Clearance: 89.9 mL/min (by C-G formula based on SCr of 0.7 mg/dL). Liver Function Tests: Recent Labs  Lab 12/14/17 1749 12/16/17 0654 12/19/17 1216 12/20/17 0641  AST 23 53* 19 19  ALT 19 20 16 11   ALKPHOS 87 83 97 56  BILITOT 0.6 1.9* 1.0 0.5  PROT 7.9 7.7 9.2* 5.4*  ALBUMIN 4.4 4.4 5.3* 3.0*   Recent Labs  Lab 12/14/17 1749 12/16/17 0654 12/19/17 1216  LIPASE 19 17 16    No results for input(s): AMMONIA in the last 168 hours. Coagulation Profile: No results for input(s): INR, PROTIME in the last 168 hours. Cardiac Enzymes: No results for input(s): CKTOTAL, CKMB, CKMBINDEX, TROPONINI in the last 168 hours. BNP (last 3 results) No results for input(s): PROBNP in the last 8760 hours. HbA1C: No results for input(s): HGBA1C in the last 72 hours. CBG: Recent Labs  Lab 12/19/17 2023 12/19/17 2350 12/20/17 0359 12/20/17 0753 12/20/17 1152  GLUCAP 258* 193* 207* 162* 309*   Lipid Profile: No results for input(s): CHOL, HDL, LDLCALC, TRIG, CHOLHDL, LDLDIRECT in the last 72 hours. Thyroid Function Tests: No results for input(s): TSH, T4TOTAL, FREET4, T3FREE, THYROIDAB in the last 72 hours. Anemia Panel: No results for input(s): VITAMINB12, FOLATE, FERRITIN, TIBC, IRON, RETICCTPCT in the last 72 hours. Urine analysis:    Component Value Date/Time   COLORURINE AMBER (A) 12/20/2017 0413   APPEARANCEUR CLOUDY (A) 12/20/2017 0413   LABSPEC 1.011 12/20/2017 0413   PHURINE 8.0 12/20/2017 0413   GLUCOSEU >=500 (A) 12/20/2017 0413   GLUCOSEU >=1000 11/07/2012 1205   HGBUR LARGE (A) 12/20/2017 0413   BILIRUBINUR NEGATIVE 12/20/2017 0413   KETONESUR 20 (A) 12/20/2017 0413   PROTEINUR 100 (A) 12/20/2017 0413   UROBILINOGEN 0.2 11/24/2014 1045   NITRITE NEGATIVE 12/20/2017 0413   LEUKOCYTESUR LARGE (A) 12/20/2017 0413   Sepsis  Labs: @LABRCNTIP (procalcitonin:4,lacticidven:4)  ) Recent Results (from the past 240 hour(s))  Urine culture     Status: Abnormal   Collection Time: 12/16/17  8:30 AM  Result Value Ref Range Status   Specimen Description   Final    URINE, CLEAN CATCH Performed at [facility], [place] [address] [place], [place] [address]    Special Requests   Final    NONE Performed at [facility], [place] [address], [place], [place] [address]    Culture (A)  Final    10,000 COLONIES/mL DIPHTHEROIDS(CORYNEBACTERIUM SPECIES)   Report Status 12/17/2017 FINAL  Final      Radiology Studies: No results found.   Scheduled Meds: . enoxaparin (LOVENOX) injection  40 mg Subcutaneous Q24H  . insulin pump   Subcutaneous TID AC, HS, 0200  . pantoprazole (PROTONIX) IV  40 mg  Intravenous Q24H   Continuous Infusions:   LOS: 0 days   Time Spent in minutes   30 minutes  Jovi Zavadil D.O. on 12/20/2017 at 2:27 PM  Between 7am to 7pm - Please see pager noted on [email or web address]  After 7pm go to [email or web address]  And look for the night coverage person covering for me after hours  [facility] Office  [phone]

## 2017-12-20 NOTE — Progress Notes (Signed)
Inpatient Diabetes Program Recommendations  AACE/ADA: New Consensus Statement on Inpatient Glycemic Control (2015)  Target Ranges:  Prepandial:   less than 140 mg/dL      Peak postprandial:   less than 180 mg/dL (1-2 hours)      Critically ill patients:  140 - 180 mg/dL   Results for [name], [name] (MRN [id number]) as of 12/20/2017 09:22  Ref. Range 12/19/2017 12:06 12/19/2017 17:25 12/19/2017 17:30 12/19/2017 17:50 12/19/2017 18:15 12/19/2017 20:23  Glucose-Capillary Latest Ref Range: 70 - 99 mg/dL 169 (H) 28 (LL) 23 (LL) 59 (L) 119 (H) 258 (H)  5 units NOVOLOG    Results for [name], [name] (MRN [id number]) as of 12/20/2017 09:22  Ref. Range 12/19/2017 23:50 12/20/2017 03:59  Glucose-Capillary Latest Ref Range: 70 - 99 mg/dL 193 (H)  2 units NOVOLOG  207 (H)  2 units NOVOLOG     Admit with: Nausea vomiting abdominal pain for 3 days  History: Type 1 DM, Gastroparesis, Marijuana Absue  Home DM Meds: Insulin Pump  Current Orders: Novolog Sensitive Correction Scale/ SSI (0-9 units) Q4 hours      MD--Per the Progress notes, it looks as if patient was instructed to stop and remove her Insulin Pump last night around 8:45pm.  Patient experienced a severe Hypoglycemic event at 5:30pm prior to removing insulin pump.  Concerned about this, as patient is not getting her basal rates on her pump since her pump has been removed.  If patient is A&O and has Insulin Pump supplies, recommend we have patient resume her Insulin Pump so as to prevent DKA from lack of basal insulin.  If she is not appropriate for her Insulin Pump, we will need to give her Lantus while she is off the pump.  Plan to see pt this AM to assess her and assess her pump as well.     --Will follow patient during hospitalization--  Wyn Quaker RN, MSN, CDE Diabetes Coordinator Inpatient Glycemic Control Team Team Pager: [phone] (8a-5p)

## 2017-12-20 NOTE — Progress Notes (Signed)
Patient transferred from 5w. Alert and oriented x4. No c/o pain or nausea at this time. Assessment is unchanged from previous assessment. Will continue to monitor.

## 2017-12-20 NOTE — Progress Notes (Signed)
Met with patient this AM.  Pt A&O and not complaining of any pain at this time.  Desires to restart Insulin Pump.  Called Dr. Ree Kida to discuss.  Dr. Ree Kida gave me permission to have pt restart home insulin pump now and stop Novolog SSI Q4H.    Insulin Pump orders entered into [facility] and Novolog SSi d/c'd with Dr. Aldine Contes permission.  New orders reviewed with RN caring for pt and also documentation needs reviewed with RN as well.  Patient told me that she just changed all her pump supplies out yesterday (12/01) (new site, new tubing, new reservoir, new insulin).  Pump was stopped last night around 9pm.  Current insertion site on abdomen should be patent and OK to use, however, I did instruct pt to change out site (or any other supplies if needed) if she noticed her CBGs were becoming elevated despite basal rates and boluses.  Patient stated understanding.  Patient has extra pump supplies at bedside but does not have extra insulin.  Told pt we can give her a vial of Novolog to fill her pump if needed.  Pt stated understanding.  Patient restarted insulin pump at 11:30am today.  --Insulin Pump Settings--  Basal Rates: 1.8 units/hr all day  Total Basal Insulin per 24 hours period= 43.2 units  Carbohydrate Ratio: 1 unit for every 10 Grams of Carbohydrates  Correction/Sensitivity Factor: 1 unit for every 66 mg/dl above Target CBG  Target CBG: 100-150 mg/dl     --Will follow patient during hospitalization--  Wyn Quaker RN, MSN, CDE Diabetes Coordinator Inpatient Glycemic Control Team Team Pager: [phone] (8a-5p)

## 2017-12-20 NOTE — Progress Notes (Addendum)
At 2015, Patient BS dropped to 82. Patient did not self administer any insulin. At 2300, patient BS rose to 291. Patient said she will self administere 2.1 units. Independent and wanted to administer herself. No c/o of n/v at this time. No other complaints. Patient is resting. Will continue to monitor.

## 2017-12-21 LAB — GLUCOSE, CAPILLARY
Glucose-Capillary: 28 mg/dL — CL (ref 70–99)
Glucose-Capillary: 89 mg/dL (ref 70–99)
Glucose-Capillary: 66 mg/dL — ABNORMAL LOW (ref 70–99)
Glucose-Capillary: 81 mg/dL (ref 70–99)

## 2017-12-21 LAB — BASIC METABOLIC PANEL
Anion gap: 8 (ref 5–15)
BUN: 10 mg/dL (ref 6–20)
CO2: 25 mmol/L (ref 22–32)
Calcium: 8.3 mg/dL — ABNORMAL LOW (ref 8.9–10.3)
Chloride: 108 mmol/L (ref 98–111)
Creatinine, Ser: 0.6 mg/dL (ref 0.44–1.00)
GFR calc Af Amer: >60 mL/min (ref >60)
GFR calc non Af Amer: >60 mL/min (ref >60)
Glucose, Bld: 106 mg/dL — ABNORMAL HIGH (ref 70–99)
Potassium: 4.1 mmol/L (ref 3.5–5.1)
SODIUM: 141 mmol/L (ref 135–145)

## 2017-12-21 MED ORDER — PANTOPRAZOLE SODIUM 40 MG PO TBEC: 40.0000 mg | DELAYED_RELEASE_TABLET | Freq: Every day | ORAL | 0 refills | Status: DC

## 2017-12-21 NOTE — Progress Notes (Signed)
Hypoglycemic Event  CBG: 66  Treatment: 15 GM carbohydrate snack  Symptoms: None  Follow-up CBG: Time:  CBG Result:  Possible Reasons for Event: Inadequate meal intake  Comments/MD notified:pt breakfast arrived. md aware.     Eun Vermeer L

## 2017-12-21 NOTE — Discharge Summary (Signed)
Physician Discharge Summary  [name] WTU:[id number] DOB: 30-Jun-1990 DOA: 12/19/2017  PCP: Vicenta Aly, FNP  Admit date: 12/19/2017 Discharge date: 12/21/2017  Time spent: 45 minutes  Recommendations for Outpatient Follow-up:  Patient will be discharged to home.  Patient will need to follow up with primary care provider within one week of discharge.  Follow up with gastroenterology. Patient should continue medications as prescribed.  Patient should follow a carb modified diet.   Discharge Diagnoses:  Abdominal pain secondary to Diabetic gastroparesis  Diabetes mellitus, Type I Mild tachycardia Hypokalemia  Discharge Condition: Stable  Diet recommendation: carb modified   Filed Weights   12/19/17 1743  Weight: 54.4 kg    History of present illness:  On 12/19/2017 by Dr. Cristal Deer Particia Moreheadis a 27 y.o.femalewith medical history significant fortype 1 diabetes mellitus on insulin pump, gastroparesis, GERD, cannabis related hyperemesis, previous DKA, who presented to the emergency department on November 29, 2017 December 08, 2017 December 14, 2017, December 16, 2017 and again today December 19, 2017 for nausea vomiting and abdominal pain. Patient stated that she try to use her Zofran suppositories at home as well as Reglan and Phenergan but it did not help. Patient stated present symptoms started about 2 days ago nothing makes it better. When I went into the room she was sleeping soundly when I woke her up she says she is in so much pain and I asked her not before or now she said now. She also has not had any appetite.  Hospital Course:  Abdominal pain secondary to Diabetic gastroparesis  -Patient has been using her Reglan, Zofran as well as Phenergan at home with no relief of her symptoms.  She does admit to running out of Protonix.  Follows up with Dr. Gillermina Hu. -patient tolerated carb modified diet -no further complaints of nausea, vomiting, abdominal  pain -continue PPI on discharge  Diabetes mellitus, Type I -Uses insulin pump- continued during hospitalization -diabetes coordinator consulted -CBGs appear to be controlled  Mild tachycardia -Suspect secondary to dehydration along with vomiting -Continue IVF  Hypokalemia -Likely secondary to GI losses -resolved with replacement  Procedures: None  Consultations: None  Discharge Exam: Vitals:   12/20/17 2018 12/21/17 0424  BP: 114/77 99/66  Pulse: (!) 101 88  Resp: 17 16  Temp: 98.7 F (37.1 C) 98.2 F (36.8 C)  SpO2: 100% 100%    Patient with no further complaints of abdominal pain, nausea or vomiting today.  Was able to tolerate diet well.  Would like to go home today.  Currently denies chest pain, shortness of breath, diarrhea or constipation, dizziness or headache.   General: Well developed, well nourished, NAD, appears stated age  79: NCAT, mucous membranes moist.  Neck: Supple  Cardiovascular: S1 S2 auscultated, RRR, no murmur  Respiratory: Clear to auscultation bilaterally with equal chest rise  Abdomen: Soft, nontender, nondistended, + bowel sounds  Extremities: warm dry without cyanosis clubbing or edema  Neuro: AAOx3, nonfocal  Psych: Normal affect and demeanor   Discharge Instructions Discharge Instructions    Discharge instructions   Complete by:  As directed    Patient will be discharged to home.  Patient will need to follow up with primary care provider within one week of discharge.  Follow up with gastroenterology. Patient should continue medications as prescribed.  Patient should follow a carb modified diet.     Allergies as of 12/21/2017      Reactions   Peanut-containing Drug Products Swelling, Other (See Comments)  Reaction:  Swelling of mouth and lips    Food Swelling, Other (See Comments)   Pt is allergic to strawberries.   Reaction:  Swelling of mouth and lips    Ultram [tramadol] Itching      Medication List    STOP  taking these medications   polyethylene glycol packet Commonly known as:  MIRALAX / GLYCOLAX   tamsulosin 0.4 MG Caps capsule Commonly known as:  FLOMAX   traMADol 50 MG tablet Commonly known as:  ULTRAM     TAKE these medications   albuterol 108 (90 Base) MCG/ACT inhaler Commonly known as:  PROVENTIL HFA;VENTOLIN HFA Inhale 1-2 puffs into the lungs every 6 (six) hours as needed for wheezing or shortness of breath.   blood glucose meter kit and supplies Dispense based on patient and insurance preference. Use up to four times daily as directed. (FOR ICD-10 E10.9, E11.9).   dicyclomine 10 MG capsule Commonly known as:  BENTYL Take 1 capsule (10 mg total) by mouth 3 (three) times daily as needed for spasms.   glucagon 1 MG injection Inject 1 mg into the muscle once as needed (For very low blood sugars. May repeat in 15-20 minutes if needed.).   insulin aspart 100 UNIT/ML FlexPen Commonly known as:  NOVOLOG Use with mealtime insulin based on 1:15 carb count as well as correctional insulin per sliding scale (see below) Blood sugar <120 = 0 units Blood sugar 121-150 = 1 unit Blood sugar 150-200 = 2 units Blood sugar 201-250 = 3 units Blood sugar 251-300 = 5 units Blood sugar 301-350= 7 units Blood sugar 351 - 400 = 9 units Over 400 call your doctor   Insulin Glargine 100 UNIT/ML Solostar Pen Commonly known as:  LANTUS Inject 25 Units into the skin daily.   Insulin Pen Needle 31G X 5 MM Misc 30 Units by Does not apply route at bedtime.   insulin pump Soln Inject 0-9 each into the skin continuous. Novolog 1:15 carb count  BS<120= 0 units 121-150= 1 unit 150-200= 2 units 201-250= 3 units 251-300= 5 units 301-350= 7 units 351-400= 9 units >400 Call MD   metoCLOPramide 10 MG tablet Commonly known as:  REGLAN Take 1 tablet (10 mg total) by mouth 3 (three) times daily with meals.   ondansetron 4 MG disintegrating tablet Commonly known as:  ZOFRAN-ODT Take 1 tablet (4  mg total) by mouth every 8 (eight) hours as needed for nausea or vomiting. What changed:  Another medication with the same name was removed. Continue taking this medication, and follow the directions you see here.   pantoprazole 40 MG tablet Commonly known as:  PROTONIX Take 1 tablet (40 mg total) by mouth daily. What changed:    when to take this  reasons to take this   promethazine 25 MG suppository Commonly known as:  PHENERGAN Place 1 suppository (25 mg total) rectally every 6 (six) hours as needed for nausea or vomiting. What changed:  Another medication with the same name was removed. Continue taking this medication, and follow the directions you see here.   scopolamine 1 MG/3DAYS Commonly known as:  TRANSDERM-SCOP Place 1 patch (1.5 mg total) onto the skin every 3 (three) days as needed (during episodes of vomiting).      Allergies  Allergen Reactions  . Peanut-Containing Drug Products Swelling and Other (See Comments)    Reaction:  Swelling of mouth and lips   . Food Swelling and Other (See Comments)    Pt  is allergic to strawberries.   Reaction:  Swelling of mouth and lips   . Ultram [Tramadol] Itching   Follow-up Information    Vicenta Aly, FNP. Schedule an appointment as soon as possible for a visit in 1 week(s).   Specialty:  Nurse Practitioner Why:  Hospital follow up Contact information: [place] [address] [phone]        Arta Silence, MD. Schedule an appointment as soon as possible for a visit in 1 week(s).   Specialty:  Gastroenterology Why:  Hospital follow up Contact information: [address] [place] [address] [phone]            The results of significant diagnostics from this hospitalization (including imaging, microbiology, ancillary and laboratory) are listed below for reference.    Significant Diagnostic Studies: Dg Abd 2 Views  Result Date: 12/03/2017 CLINICAL  DATA:  Emesis EXAM: ABDOMEN - 2 VIEW COMPARISON:  11/29/2017 FINDINGS: Visualized lung bases clear. No free air. Normal bowel gas pattern.  Cholecystectomy clips. Stable small left pelvic phlebolith. Regional bones unremarkable. IMPRESSION: Negative. Electronically Signed   By: [name] M.D.   On: 12/03/2017 15:20   Dg Abd Acute W/chest  Result Date: 11/29/2017 CLINICAL DATA:  Persistent lower abdominal pain with nausea and vomiting. EXAM: DG ABDOMEN ACUTE W/ 1V CHEST COMPARISON:  Abdominal CT scan of November 26, 2017 and abdominal radiographs of November 17, 2017 FINDINGS: The lungs are well-expanded and clear. The heart and pulmonary vascularity are normal. The mediastinum is normal in width. The trachea is midline. Within the abdomen there is a relative paucity of bowel gas. The stool burden is not excessive. There is no evidence of a fecal impaction. There are surgical clips in the gallbladder fossa. The bony structures exhibit no acute abnormalities. IMPRESSION: There is no acute cardiopulmonary abnormality. No acute intra-abdominal abnormality is observed either. Electronically Signed   By: David  Martinique M.D.   On: 11/29/2017 08:02   [place] Ekg Site Rite  Result Date: 12/03/2017 If Site Rite image not attached, placement could not be confirmed due to current cardiac rhythm.   Microbiology: Recent Results (from the past 240 hour(s))  Urine culture     Status: Abnormal   Collection Time: 12/16/17  8:30 AM  Result Value Ref Range Status   Specimen Description   Final    URINE, CLEAN CATCH Performed at [facility], [place] [address] [place], [place] [address]    Special Requests   Final    NONE Performed at [facility], [place] [address], [place], [place] [address]    Culture (A)  Final    10,000 COLONIES/mL DIPHTHEROIDS(CORYNEBACTERIUM SPECIES)   Report Status 12/17/2017 FINAL  Final     Labs: Basic Metabolic Panel: Recent Labs  Lab 12/14/17 1749  12/16/17 0654 12/16/17 1036 12/19/17 1216 12/20/17 0641 12/21/17 0608  NA 138 137  --  138 139 141  K 3.7 5.2* 3.4* 3.2* 3.8 4.1  CL 100 102  --  96* 104 108  CO2 29 24  --  28 27 25   GLUCOSE 165* 189*  --  174* 162* 106*  BUN 14 22*  --  19 12 10   CREATININE 0.57 0.69  --  0.71 0.70 0.60  CALCIUM 9.4 9.6  --  10.5* 8.5* 8.3*   Liver Function Tests: Recent Labs  Lab 12/14/17 1749 12/16/17 0654 12/19/17 1216 12/20/17 0641  AST 23 53* 19 19  ALT 19 20 16 11   ALKPHOS 87 83 97 56  BILITOT 0.6 1.9* 1.0 0.5  PROT 7.9 7.7 9.2* 5.4*  ALBUMIN 4.4 4.4 5.3* 3.0*   Recent Labs  Lab 12/14/17 1749 12/16/17 0654 12/19/17 1216  LIPASE 19 17 16    No results for input(s): AMMONIA in the last 168 hours. CBC: Recent Labs  Lab 12/14/17 1749 12/16/17 0654 12/19/17 1216  WBC 8.8 8.2 7.9  NEUTROABS  --  5.4 4.9  HGB 12.8 12.7 14.8  HCT 40.6 39.4 46.1*  MCV 90.4 89.7 88.7  PLT 526* 472* 639*   Cardiac Enzymes: No results for input(s): CKTOTAL, CKMB, CKMBINDEX, TROPONINI in the last 168 hours. BNP: BNP (last 3 results) No results for input(s): BNP in the last 8760 hours.  ProBNP (last 3 results) No results for input(s): PROBNP in the last 8760 hours.  CBG: Recent Labs  Lab 12/20/17 2016 12/20/17 2309 12/21/17 0423 12/21/17 0740 12/21/17 0942  GLUCAP 82 291* 89 66* 81       Signed:  [facility] 12/21/2017, 10:20 AM

## 2017-12-21 NOTE — Discharge Instructions (Signed)
Gastroparesis °Gastroparesis, also called delayed gastric emptying, is a condition in which food takes longer than normal to empty from the stomach. The condition is usually long-lasting (chronic). °What are the causes? °This condition may be caused by: °· An endocrine disorder, such as hypothyroidism or diabetes. Diabetes is the most common cause of this condition. °· A nervous system disease, such as Parkinson disease or multiple sclerosis. °· Cancer, infection, or surgery of the stomach or vagus nerve. °· A connective tissue disorder, such as scleroderma. °· Certain medicines. ° °In most cases, the cause is not known. °What increases the risk? °This condition is more likely to develop in: °· People with certain disorders, including endocrine disorders, eating disorders, amyloidosis, and scleroderma. °· People with certain diseases, including Parkinson disease or multiple sclerosis. °· People with cancer or infection of the stomach or vagus nerve. °· People who have had surgery on the stomach or vagus nerve. °· People who take certain medicines. °· Women. ° °What are the signs or symptoms? °Symptoms of this condition include: °· An early feeling of fullness when eating. °· Nausea. °· Weight loss. °· Vomiting. °· Heartburn. °· Abdominal bloating. °· Inconsistent blood glucose levels. °· Lack of appetite. °· Acid from the stomach coming up into the esophagus (gastroesophageal reflux). °· Spasms of the stomach. ° °Symptoms may come and go. °How is this diagnosed? °This condition is diagnosed with tests, such as: °· Tests that check how long it takes food to move through the stomach and intestines. These tests include: °? Upper gastrointestinal (GI) series. In this test, X-rays of the intestines are taken after you drink a liquid. The liquid makes the intestines show up better on the X-rays. °? Gastric emptying scintigraphy. In this test, scans are taken after you eat food that contains a small amount of radioactive  material. °? Wireless capsule GI monitoring system. This test involves swallowing a capsule that records information about movement through the stomach. °· Gastric manometry. This test measures electrical and muscular activity in the stomach. It is done with a thin tube that is passed down the throat and into the stomach. °· Endoscopy. This test checks for abnormalities in the lining of the stomach. It is done with a long, thin tube that is passed down the throat and into the stomach. °· An ultrasound. This test can help rule out gallbladder disease or pancreatitis as a cause of your symptoms. It uses sound waves to take pictures of the inside of your body. ° °How is this treated? °There is no cure for gastroparesis. This condition may be managed with: °· Treatment of the underlying condition causing the gastroparesis. °· Lifestyle changes, including exercise and dietary changes. Dietary changes can include: °? Changes in what and when you eat. °? Eating smaller meals more often. °? Eating low-fat foods. °? Eating low-fiber forms of high-fiber foods, such as cooked vegetables instead of raw vegetables. °? Having liquid foods in place of solid foods. Liquid foods are easier to digest. °· Medicines. These may be given to control nausea and vomiting and to stimulate stomach muscles. °· Getting food through a feeding tube. This may be done in severe cases. °· A gastric neurostimulator. This is a device that is inserted into the body with surgery. It helps improve stomach emptying and control nausea and vomiting. ° °Follow these instructions at home: °· Follow your health care provider's instructions about exercise and diet. °· Take medicines only as directed by your health care provider. °Contact a   health care provider if: °· Your symptoms do not improve with treatment. °· You have new symptoms. °Get help right away if: °· You have severe abdominal pain that does not improve with treatment. °· You have nausea that does  not go away. °· You cannot keep fluids down. °This information is not intended to replace advice given to you by your health care provider. Make sure you discuss any questions you have with your health care provider. °Document Released: 01/05/2005 Document Revised: 06/13/2015 Document Reviewed: 01/01/2014 °Elsevier Interactive Patient Education © 2018 [employer] ° °

## 2017-12-22 ENCOUNTER — Emergency Department (HOSPITAL_COMMUNITY): Payer: Medicaid Other

## 2017-12-22 ENCOUNTER — Encounter (HOSPITAL_COMMUNITY): Payer: Self-pay

## 2017-12-22 ENCOUNTER — Other Ambulatory Visit: Payer: Self-pay

## 2017-12-22 ENCOUNTER — Inpatient Hospital Stay (HOSPITAL_COMMUNITY)
Admission: EM | Admit: 2017-12-22 | Discharge: 2017-12-27 | DRG: 074 | Disposition: A | Discharge status: Home / Self Care | Payer: Medicaid Other | Attending: Internal Medicine | Admitting: Internal Medicine

## 2017-12-22 DIAGNOSIS — K3184 Gastroparesis: Secondary | ICD-10-CM | POA: Diagnosis present

## 2017-12-22 DIAGNOSIS — E876 Hypokalemia: Secondary | ICD-10-CM

## 2017-12-22 DIAGNOSIS — K219 Gastro-esophageal reflux disease without esophagitis: Secondary | ICD-10-CM | POA: Diagnosis present

## 2017-12-22 DIAGNOSIS — R112 Nausea with vomiting, unspecified: Secondary | ICD-10-CM | POA: Diagnosis not present

## 2017-12-22 DIAGNOSIS — Z833 Family history of diabetes mellitus: Secondary | ICD-10-CM

## 2017-12-22 DIAGNOSIS — D649 Anemia, unspecified: Secondary | ICD-10-CM | POA: Diagnosis present

## 2017-12-22 DIAGNOSIS — Z794 Long term (current) use of insulin: Secondary | ICD-10-CM

## 2017-12-22 DIAGNOSIS — E1065 Type 1 diabetes mellitus with hyperglycemia: Secondary | ICD-10-CM

## 2017-12-22 DIAGNOSIS — E1143 Type 2 diabetes mellitus with diabetic autonomic (poly)neuropathy: Secondary | ICD-10-CM

## 2017-12-22 DIAGNOSIS — Z885 Allergy status to narcotic agent status: Secondary | ICD-10-CM

## 2017-12-22 DIAGNOSIS — Z8249 Family history of ischemic heart disease and other diseases of the circulatory system: Secondary | ICD-10-CM

## 2017-12-22 DIAGNOSIS — Z9641 Presence of insulin pump (external) (internal): Secondary | ICD-10-CM | POA: Diagnosis present

## 2017-12-22 DIAGNOSIS — E860 Dehydration: Secondary | ICD-10-CM | POA: Diagnosis present

## 2017-12-22 DIAGNOSIS — J45909 Unspecified asthma, uncomplicated: Secondary | ICD-10-CM | POA: Diagnosis present

## 2017-12-22 DIAGNOSIS — E1043 Type 1 diabetes mellitus with diabetic autonomic (poly)neuropathy: Principal | ICD-10-CM | POA: Diagnosis present

## 2017-12-22 DIAGNOSIS — E10649 Type 1 diabetes mellitus with hypoglycemia without coma: Secondary | ICD-10-CM | POA: Diagnosis present

## 2017-12-22 DIAGNOSIS — Z91018 Allergy to other foods: Secondary | ICD-10-CM

## 2017-12-22 DIAGNOSIS — Z91010 Allergy to peanuts: Secondary | ICD-10-CM

## 2017-12-22 DIAGNOSIS — Z9049 Acquired absence of other specified parts of digestive tract: Secondary | ICD-10-CM

## 2017-12-22 DIAGNOSIS — I10 Essential (primary) hypertension: Secondary | ICD-10-CM | POA: Diagnosis present

## 2017-12-22 DIAGNOSIS — Z79899 Other long term (current) drug therapy: Secondary | ICD-10-CM

## 2017-12-22 DIAGNOSIS — F419 Anxiety disorder, unspecified: Secondary | ICD-10-CM | POA: Diagnosis present

## 2017-12-22 DIAGNOSIS — K760 Fatty (change of) liver, not elsewhere classified: Secondary | ICD-10-CM | POA: Diagnosis present

## 2017-12-22 DIAGNOSIS — Z8349 Family history of other endocrine, nutritional and metabolic diseases: Secondary | ICD-10-CM

## 2017-12-22 LAB — I-STAT BETA HCG BLOOD, ED (MC, WL, AP ONLY): I-stat hCG, quantitative: <5 m[IU]/mL (ref <5)

## 2017-12-22 LAB — URINALYSIS, ROUTINE W REFLEX MICROSCOPIC
Bacteria, UA: NONE SEEN
Bilirubin Urine: NEGATIVE
GLUCOSE, UA: 50 mg/dL — AB
Ketones, ur: 5 mg/dL — AB
Nitrite: NEGATIVE
Protein, ur: NEGATIVE mg/dL
Specific Gravity, Urine: 1.008 (ref 1.005–1.030)
pH: 8 (ref 5.0–8.0)

## 2017-12-22 LAB — COMPREHENSIVE METABOLIC PANEL
ALT: 12 U/L (ref 0–44)
AST: 18 U/L (ref 15–41)
Albumin: 3.5 g/dL (ref 3.5–5.0)
Alkaline Phosphatase: 61 U/L (ref 38–126)
Anion gap: 9 (ref 5–15)
BUN: 6 mg/dL (ref 6–20)
CO2: 23 mmol/L (ref 22–32)
Calcium: 7.5 mg/dL — ABNORMAL LOW (ref 8.9–10.3)
Chloride: 110 mmol/L (ref 98–111)
Creatinine, Ser: 0.41 mg/dL — ABNORMAL LOW (ref 0.44–1.00)
GFR calc Af Amer: >60 mL/min (ref >60)
GFR calc non Af Amer: >60 mL/min (ref >60)
Glucose, Bld: 157 mg/dL — ABNORMAL HIGH (ref 70–99)
Potassium: 3 mmol/L — ABNORMAL LOW (ref 3.5–5.1)
Sodium: 142 mmol/L (ref 135–145)
Total Bilirubin: 0.5 mg/dL (ref 0.3–1.2)
Total Protein: 6.4 g/dL — ABNORMAL LOW (ref 6.5–8.1)

## 2017-12-22 LAB — GLUCOSE, CAPILLARY
Glucose-Capillary: 34 mg/dL — CL (ref 70–99)
Glucose-Capillary: 81 mg/dL (ref 70–99)
Glucose-Capillary: 77 mg/dL (ref 70–99)

## 2017-12-22 LAB — CBC WITH DIFFERENTIAL/PLATELET
Abs Immature Granulocytes: 0.03 10*3/uL (ref 0.00–0.07)
Basophils Absolute: 0 10*3/uL (ref 0.0–0.1)
Basophils Relative: 0 %
Eosinophils Absolute: 0.1 10*3/uL (ref 0.0–0.5)
Eosinophils Relative: 1 %
HCT: 32.1 % — ABNORMAL LOW (ref 36.0–46.0)
HEMOGLOBIN: 10.1 g/dL — AB (ref 12.0–15.0)
Immature Granulocytes: 1 %
LYMPHS PCT: 18 %
Lymphs Abs: 1.1 10*3/uL (ref 0.7–4.0)
MCH: 28.9 pg (ref 26.0–34.0)
MCHC: 31.5 g/dL (ref 30.0–36.0)
MCV: 92 fL (ref 80.0–100.0)
Monocytes Absolute: 0.2 10*3/uL (ref 0.1–1.0)
Monocytes Relative: 3 %
Neutro Abs: 5 10*3/uL (ref 1.7–7.7)
Neutrophils Relative %: 77 %
Platelets: UNDETERMINED 10*3/uL (ref 150–400)
RBC: 3.49 MIL/uL — ABNORMAL LOW (ref 3.87–5.11)
RDW: 14 % (ref 11.5–15.5)
WBC: 6.4 10*3/uL (ref 4.0–10.5)
nRBC: 0 % (ref 0.0–0.2)

## 2017-12-22 LAB — MAGNESIUM: Magnesium: 1.4 mg/dL — ABNORMAL LOW (ref 1.7–2.4)

## 2017-12-22 LAB — LIPASE, BLOOD: Lipase: 18 U/L (ref 11–51)

## 2017-12-22 MED ORDER — DICYCLOMINE HCL 10 MG PO CAPS
10.0000 mg | ORAL_CAPSULE | Freq: Three times a day (TID) | ORAL | Status: DC | PRN
  Filled 2017-12-22: qty 1

## 2017-12-22 MED ORDER — GLUCOSE 4 G PO CHEW
CHEWABLE_TABLET | ORAL | Status: AC
  Administered 2017-12-22: 23:00:00
  Filled 2017-12-22: qty 1

## 2017-12-22 MED ORDER — ALBUTEROL SULFATE (2.5 MG/3ML) 0.083% IN NEBU: 2.5000 mg | INHALATION_SOLUTION | Freq: Four times a day (QID) | RESPIRATORY_TRACT | Status: DC | PRN

## 2017-12-22 MED ORDER — MORPHINE SULFATE (PF) 2 MG/ML IV SOLN
1.0000 mg | INTRAVENOUS | Status: DC | PRN
  Administered 2017-12-22 – 2017-12-23 (×4): 1 mg via INTRAVENOUS
  Filled 2017-12-22 (×5): qty 1

## 2017-12-22 MED ORDER — MAGNESIUM SULFATE 2 GM/50ML IV SOLN
2.0000 g | Freq: Once | INTRAVENOUS | Status: AC
  Administered 2017-12-22: 2 g via INTRAVENOUS
  Filled 2017-12-22: qty 50

## 2017-12-22 MED ORDER — PROMETHAZINE HCL 25 MG/ML IJ SOLN
25.0000 mg | Freq: Four times a day (QID) | INTRAMUSCULAR | Status: DC | PRN
  Administered 2017-12-24 – 2017-12-25 (×3): 25 mg via INTRAVENOUS
  Filled 2017-12-22 (×3): qty 1

## 2017-12-22 MED ORDER — FAMOTIDINE IN NACL 20-0.9 MG/50ML-% IV SOLN
20.0000 mg | Freq: Two times a day (BID) | INTRAVENOUS | Status: DC
  Administered 2017-12-22: 20 mg via INTRAVENOUS
  Filled 2017-12-22 (×3): qty 50

## 2017-12-22 MED ORDER — ENOXAPARIN SODIUM 40 MG/0.4ML ~~LOC~~ SOLN
40.0000 mg | SUBCUTANEOUS | Status: DC
  Administered 2017-12-22 – 2017-12-26 (×5): 40 mg via SUBCUTANEOUS
  Filled 2017-12-22 (×5): qty 0.4

## 2017-12-22 MED ORDER — MORPHINE SULFATE (PF) 4 MG/ML IV SOLN
4.0000 mg | Freq: Once | INTRAVENOUS | Status: AC
  Administered 2017-12-22: 4 mg via INTRAVENOUS
  Filled 2017-12-22: qty 1

## 2017-12-22 MED ORDER — INSULIN PUMP
SUBCUTANEOUS | Status: DC
  Administered 2017-12-23 (×2): via SUBCUTANEOUS
  Administered 2017-12-23: 3 via SUBCUTANEOUS
  Administered 2017-12-24 (×2): via SUBCUTANEOUS
  Administered 2017-12-26 (×3): 0.2 via SUBCUTANEOUS
  Filled 2017-12-22: qty 1

## 2017-12-22 MED ORDER — POTASSIUM CHLORIDE 20 MEQ PO PACK
40.0000 meq | PACK | Freq: Once | ORAL | Status: AC
  Administered 2017-12-22: 40 meq via ORAL
  Filled 2017-12-22: qty 2

## 2017-12-22 MED ORDER — MAGNESIUM SULFATE 50 % IJ SOLN
2.0000 g | Freq: Once | INTRAMUSCULAR | Status: DC
  Filled 2017-12-22: qty 4

## 2017-12-22 MED ORDER — LORAZEPAM 2 MG/ML IJ SOLN
1.0000 mg | Freq: Once | INTRAMUSCULAR | Status: AC
  Administered 2017-12-22: 1 mg via INTRAVENOUS
  Filled 2017-12-22: qty 1

## 2017-12-22 MED ORDER — DEXTROSE 50 % IV SOLN
INTRAVENOUS | Status: AC
  Filled 2017-12-22: qty 50

## 2017-12-22 MED ORDER — HALOPERIDOL LACTATE 5 MG/ML IJ SOLN
2.0000 mg | Freq: Once | INTRAMUSCULAR | Status: AC
  Administered 2017-12-22: 2 mg via INTRAVENOUS
  Filled 2017-12-22: qty 1

## 2017-12-22 MED ORDER — METOCLOPRAMIDE HCL 5 MG/ML IJ SOLN
10.0000 mg | Freq: Once | INTRAMUSCULAR | Status: AC
  Administered 2017-12-22: 10 mg via INTRAVENOUS
  Filled 2017-12-22: qty 2

## 2017-12-22 MED ORDER — SODIUM CHLORIDE 0.9 % IV BOLUS
1000.0000 mL | Freq: Once | INTRAVENOUS | Status: AC
  Administered 2017-12-22: 1000 mL via INTRAVENOUS

## 2017-12-22 MED ORDER — DIPHENHYDRAMINE HCL 50 MG/ML IJ SOLN
25.0000 mg | Freq: Once | INTRAMUSCULAR | Status: AC
  Administered 2017-12-22: 25 mg via INTRAVENOUS
  Filled 2017-12-22: qty 1

## 2017-12-22 MED ORDER — SCOPOLAMINE 1 MG/3DAYS TD PT72
1.0000 | MEDICATED_PATCH | TRANSDERMAL | Status: DC | PRN
  Administered 2017-12-24: 1.5 mg via TRANSDERMAL
  Filled 2017-12-22: qty 1

## 2017-12-22 MED ORDER — POTASSIUM CHLORIDE 10 MEQ/100ML IV SOLN
10.0000 meq | INTRAVENOUS | Status: AC
  Administered 2017-12-22 (×4): 10 meq via INTRAVENOUS
  Filled 2017-12-22 (×4): qty 100

## 2017-12-22 MED ORDER — INSULIN ASPART 100 UNIT/ML ~~LOC~~ SOLN: 0.0000 [IU] | Freq: Three times a day (TID) | SUBCUTANEOUS | Status: DC

## 2017-12-22 MED ORDER — METOCLOPRAMIDE HCL 10 MG PO TABS
10.0000 mg | ORAL_TABLET | Freq: Three times a day (TID) | ORAL | Status: DC
  Administered 2017-12-23 – 2017-12-27 (×11): 10 mg via ORAL
  Filled 2017-12-22 (×12): qty 1

## 2017-12-22 MED ORDER — ACETAMINOPHEN 325 MG PO TABS: 650.0000 mg | ORAL_TABLET | Freq: Four times a day (QID) | ORAL | Status: DC | PRN

## 2017-12-22 MED ORDER — ONDANSETRON 4 MG PO TBDP
4.0000 mg | ORAL_TABLET | Freq: Three times a day (TID) | ORAL | Status: DC | PRN
  Administered 2017-12-24: 4 mg via ORAL
  Filled 2017-12-22 (×2): qty 1

## 2017-12-22 MED ORDER — ACETAMINOPHEN 650 MG RE SUPP: 650.0000 mg | Freq: Four times a day (QID) | RECTAL | Status: DC | PRN

## 2017-12-22 MED ORDER — DEXTROSE 50 % IV SOLN
INTRAVENOUS | Status: AC
  Administered 2017-12-22: 25 mL
  Filled 2017-12-22: qty 50

## 2017-12-22 MED ORDER — SODIUM CHLORIDE 0.9 % IV SOLN
INTRAVENOUS | Status: DC
  Administered 2017-12-22 – 2017-12-24 (×2): via INTRAVENOUS
  Administered 2017-12-25: 125 mL/h via INTRAVENOUS
  Administered 2017-12-26: 1000 mL via INTRAVENOUS

## 2017-12-22 NOTE — Progress Notes (Signed)
ED TO INPATIENT HANDOFF REPORT  Name/Age/Gender [name] 27 y.o. female  Code Status Code Status History    Date Active Date Inactive Code Status Order ID Comments User Context   12/19/2017 1737 12/21/2017 1403 Full Code [id number]  Cristal Deer, MD Inpatient   12/08/2017 1340 12/11/2017 1618 Full Code [id number]  Damita Lack, MD ED   11/29/2017 0635 12/07/2017 1840 Full Code [id number]  Rise Patience, MD Inpatient   11/15/2017 0503 11/21/2017 1747 Full Code [id number]  Rise Patience, MD Inpatient   11/02/2017 1757 11/07/2017 1354 Full Code [id number]  Charlynne Cousins, MD Inpatient   01/21/2017 2304 01/29/2017 1728 Full Code [id number]  Rise Patience, MD ED   12/18/2016 0704 12/20/2016 1727 Full Code [id number]  Rondel Jumbo, PA-C ED   11/05/2016 1525 11/11/2016 1456 Full Code [id number]  Phillips Grout, MD Inpatient   10/23/2016 1342 10/29/2016 2103 Full Code [id number]  Elwin Mocha, MD ED   09/26/2016 1503 09/29/2016 1533 Full Code [id number]  Georgette Shell, MD ED   07/15/2016 1712 07/18/2016 2035 Full Code [id number]  Damita Lack, MD Inpatient   07/05/2016 1658 07/11/2016 1620 Full Code [id number]  Bonnielee Haff, MD Inpatient   06/23/2016 0856 06/28/2016 1346 Full Code [id number]  Janece Canterbury, MD Inpatient   06/12/2016 2137 06/14/2016 1723 Full Code [id number]  Sid Falcon, MD Inpatient   06/03/2016 2044 06/06/2016 2041 Full Code [id number]  Gardiner Barefoot, NP Inpatient   05/31/2016 2348 06/03/2016 2044 Full Code [id number]  Rise Patience, MD Inpatient   05/14/2016 0212 05/16/2016 1718 Full Code [id number]  Toy Baker, MD Inpatient   05/04/2016 2140 05/10/2016 2004 Full Code [id number]  Orson Eva, MD Inpatient   02/29/2016 2201 03/04/2016 1646 Full Code [id number]  Toy Baker, MD Inpatient   02/20/2016 1441 02/25/2016 1723 Full Code [id number]  Elgergawy, Silver Huguenin, MD Inpatient   01/22/2016 1021 01/25/2016 1607 Full Code  [id number]  Orson Eva, MD Inpatient   01/17/2016 0634 01/19/2016 1944 Full Code [id number]  Etta Quill, DO ED   11/18/2015 2238 11/23/2015 1231 Full Code [id number]  Bonnielee Haff, MD Inpatient   11/09/2015 2048 11/12/2015 1535 Full Code [id number]  Toy Baker, MD Inpatient   11/09/2015 2048 11/09/2015 2048 Full Code [id number]  Toy Baker, MD Inpatient   11/03/2015 0238 11/06/2015 1301 Full Code [id number]  Jani Gravel, MD Inpatient   11/02/2015 0128 11/03/2015 0238 Full Code [id number]  Phillips Grout, MD Inpatient   07/10/2015 1645 07/13/2015 1431 Full Code [id number]  Ivor Costa, MD ED   07/01/2015 2203 07/06/2015 1522 Full Code [id number]  Gennaro Africa, MD ED   06/22/2015 0624 06/24/2015 1531 Full Code [id number]  Etta Quill, DO Inpatient   06/10/2015 0156 06/11/2015 1519 Full Code [id number]  Reubin Milan, MD Inpatient   05/28/2015 1609 06/03/2015 1932 Full Code [id number]  Barton Dubois, MD Inpatient   05/24/2015 2359 05/25/2015 1858 Full Code [id number]  Theressa Millard, MD Inpatient   04/21/2015 0931 04/23/2015 1445 Full Code [id number]  Gennaro Africa, MD ED   04/08/2015 2229 04/15/2015 1605 Full Code [id number]  Reubin Milan, MD ED   03/22/2015 0243 03/25/2015 1811 Full Code [id number]  Rise Patience, MD ED   02/11/2015 1530 02/12/2015 1805 Full Code [id number]  Greer Pickerel, MD Inpatient   11/24/2014 1309 11/29/2014 1929 Full Code [id number]  Rama, Venetia Maxon, MD Inpatient   09/16/2014 0530 09/21/2014 2007 Full Code [id number]  Etta Quill,  DO ED   06/29/2012 1400 07/01/2012 1517 Full Code [id number]  Velvet Bathe, MD Inpatient   12/08/2011 1543 12/12/2011 1450 Full Code [id number]  Chyrel Masson, RN Inpatient      Home/SNF/Other Home  Chief Complaint vomiting / pain   Level of Care/Admitting Diagnosis ED Disposition    ED Disposition Condition [facility] Area: [facility] [[id number]]  Level of Care: Med-Surg [16]  Diagnosis:  Intractable nausea and vomiting [390300]  Admitting Physician: Tawni Millers [[id number]]  Attending Physician: Tawni Millers [[id number]]  PT Class (Do Not Modify): Observation [104]  PT Acc Code (Do Not Modify): Observation [10022]       Medical History Past Medical History:  Diagnosis Date  . Anxiety   . Arthritis    "hands, feet, knees" (12/18/2016)  . Asthma   . Diabetic gastroparesis ([place])    Per gastric emptying study 07/09/16 which showed significant delayed gastric emptying.  . Gallstones   . Gastroparesis   . GERD (gastroesophageal reflux disease)   . Heart murmur   . Hepatic steatosis 11/26/2014   and hepatomegaly  . Hypertension    hx (12/18/2016)  . Intractable cyclical vomiting syndrome    Archie Endo 12/18/2016  . Liver mass 11/26/2014  . Pancreatitis, acute 11/26/2014  . Pneumonia    "as a teen X 1" (12/18/2016)  . Type I diabetes mellitus ([place]) 2007   IDDM.  poorly controlled, multiple admits with DKA    Allergies Allergies  Allergen Reactions  . Peanut-Containing Drug Products Swelling and Other (See Comments)    Reaction:  Swelling of mouth and lips   . 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   Reaction:  Swelling of mouth and lips   . Ultram [Tramadol] Itching    IV Location/Drains/Wounds Patient Lines/Drains/Airways Status   Active Line/Drains/Airways    Name:   Placement date:   Placement time:   Site:   Days:   Peripheral IV 12/22/17 Left Antecubital   12/22/17    0814    Antecubital   less than 1          Labs/Imaging Results for orders placed or performed during the hospital encounter of 12/22/17 (from the past 48 hour(s))  Comprehensive metabolic panel     Status: Abnormal   Collection Time: 12/22/17  7:26 AM  Result Value Ref Range   Sodium 142 135 - 145 mmol/L   Potassium 3.0 (L) 3.5 - 5.1 mmol/L    Comment: DELTA CHECK NOTED REPEATED TO VERIFY    Chloride 110 98 - 111 mmol/L   CO2  23 22 - 32 mmol/L   Glucose, Bld 157 (H) 70 - 99 mg/dL   BUN 6 6 - 20 mg/dL   Creatinine, Ser 0.41 (L) 0.44 - 1.00 mg/dL   Calcium 7.5 (L) 8.9 - 10.3 mg/dL   Total Protein 6.4 (L) 6.5 - 8.1 g/dL   Albumin 3.5 3.5 - 5.0 g/dL   AST 18 15 - 41 U/L   ALT 12 0 - 44 U/L   Alkaline Phosphatase 61 38 - 126 U/L   Total Bilirubin 0.5 0.3 - 1.2 mg/dL   GFR calc non Af Amer >60 >60 mL/min   GFR calc Af Amer >60 >60 mL/min   Anion gap 9 5 - 15    Comment: Performed at [facility], [place] [address], [place], [place] [address]  Lipase, blood  Status: None   Collection Time: 12/22/17  7:26 AM  Result Value Ref Range   Lipase 18 11 - 51 U/L    Comment: Performed at [facility], [place] [address], [place], [place] [address]  CBC with Differential     Status: Abnormal   Collection Time: 12/22/17  7:26 AM  Result Value Ref Range   WBC 6.4 4.0 - 10.5 K/uL   RBC 3.49 (L) 3.87 - 5.11 MIL/uL   Hemoglobin 10.1 (L) 12.0 - 15.0 g/dL   HCT 32.1 (L) 36.0 - 46.0 %   MCV 92.0 80.0 - 100.0 fL   MCH 28.9 26.0 - 34.0 pg   MCHC 31.5 30.0 - 36.0 g/dL   RDW 14.0 11.5 - 15.5 %   Platelets PLATELET CLUMPS NOTED ON SMEAR, UNABLE TO ESTIMATE 150 - 400 K/uL   nRBC 0.0 0.0 - 0.2 %   Neutrophils Relative % 77 %   Neutro Abs 5.0 1.7 - 7.7 K/uL   Lymphocytes Relative 18 %   Lymphs Abs 1.1 0.7 - 4.0 K/uL   Monocytes Relative 3 %   Monocytes Absolute 0.2 0.1 - 1.0 K/uL   Eosinophils Relative 1 %   Eosinophils Absolute 0.1 0.0 - 0.5 K/uL   Basophils Relative 0 %   Basophils Absolute 0.0 0.0 - 0.1 K/uL   Immature Granulocytes 1 %   Abs Immature Granulocytes 0.03 0.00 - 0.07 K/uL    Comment: Performed at [facility], [place] [address], [place], [place] [address]  I-Stat beta hCG blood, ED     Status: None   Collection Time: 12/22/17  8:20 AM  Result Value Ref Range   I-stat hCG, quantitative <5.0 <5 mIU/mL   Comment 3            Comment:   GEST. AGE      CONC.   (mIU/mL)   <=1 WEEK        5 - 50     2 WEEKS       50 - 500     3 WEEKS       100 - 10,000     4 WEEKS     1,000 - 30,000        FEMALE AND NON-PREGNANT FEMALE:     LESS THAN 5 mIU/mL   Magnesium     Status: Abnormal   Collection Time: 12/22/17  8:55 AM  Result Value Ref Range   Magnesium 1.4 (L) 1.7 - 2.4 mg/dL    Comment: Performed at [facility], [place] [address], [place], [place] [address]   Dg Abd Acute W/chest  Result Date: 12/22/2017 CLINICAL DATA:  Abdominal pain and nausea for 2 days EXAM: DG ABDOMEN ACUTE W/ 1V CHEST COMPARISON:  12/03/2017 FINDINGS: Cardiac shadow is within normal limits. The lungs are well aerated bilaterally. No focal infiltrate or sizable effusion is seen. Scattered large and small bowel gas is noted. Fecal material is noted throughout the colon consistent with a mild degree of constipation. No free air is seen. Changes of prior cholecystectomy are noted. No bony abnormality is noted. IMPRESSION: Mild constipation. No other focal abnormality is noted. Electronically Signed   By: Inez Catalina M.D.   On: 12/22/2017 09:46    Pending Labs Unresulted Labs (From admission, onward)    Start     Ordered   12/22/17 0726  Urinalysis, Routine w reflex microscopic  ONCE - STAT,   STAT  12/22/17 0725   Signed and Held  CBC  (enoxaparin (LOVENOX)    CrCl >/= 30 ml/min)  Once,   R    Comments:  Baseline for enoxaparin therapy IF NOT ALREADY DRAWN.  Notify MD if PLT < 100 K.    Signed and Held   Signed and Held  Creatinine, serum  (enoxaparin (LOVENOX)    CrCl >/= 30 ml/min)  Once,   R    Comments:  Baseline for enoxaparin therapy IF NOT ALREADY DRAWN.    Signed and Held   Signed and Held  Creatinine, serum  (enoxaparin (LOVENOX)    CrCl >/= 30 ml/min)  Weekly,   R    Comments:  while on enoxaparin therapy    Signed and Held   Signed and Held  Basic metabolic panel  Tomorrow morning,   R     Signed and Held   Signed and Held  CBC  Tomorrow  morning,   R     Signed and Held   Signed and Held  Magnesium  Tomorrow morning,   R     Signed and Held          Vitals/Pain Today's Vitals   12/22/17 1256 12/22/17 1300 12/22/17 1317 12/22/17 1330  BP: (!) 129/91 (!) 127/95 (!) 127/95 113/89  Pulse: (!) 115 (!) 114 (!) 117 (!) 109  Resp:   20   Temp:      TempSrc:      SpO2: 99% 100% 100% 99%  PainSc:        Isolation Precautions No active isolations  Medications Medications  magnesium sulfate IVPB 2 g 50 mL (2 g Intravenous New Bag/Given 12/22/17 1417)  morphine 2 MG/ML injection 1 mg (1 mg Intravenous Given 12/22/17 1404)  metoCLOPramide (REGLAN) injection 10 mg (10 mg Intravenous Given 12/22/17 0822)  sodium chloride 0.9 % bolus 1,000 mL (0 mLs Intravenous Stopped 12/22/17 1321)  morphine 4 MG/ML injection 4 mg (4 mg Intravenous Given 12/22/17 0822)  diphenhydrAMINE (BENADRYL) injection 25 mg (25 mg Intravenous Given 12/22/17 0823)  potassium chloride 10 mEq in 100 mL IVPB (10 mEq Intravenous New Bag/Given 12/22/17 1255)  LORazepam (ATIVAN) injection 1 mg (1 mg Intravenous Given 12/22/17 0935)  sodium chloride 0.9 % bolus 1,000 mL (1,000 mLs Intravenous New Bag/Given 12/22/17 1322)  haloperidol lactate (HALDOL) injection 2 mg (2 mg Intravenous Given 12/22/17 1030)    Mobility walks

## 2017-12-22 NOTE — H&P (Signed)
History and Physical    [name] YOV:[id number] DOB: 12/15/1990 DOA: 12/22/2017  PCP: Vicenta Aly, FNP   Patient coming from: home   Chief Complaint: Nausea and vomiting.   HPI: [name] is a 27 y.o. female with medical history significant of diabetic gastroparesis, and type 1 diabetes mellitus, who presents with recurrent nausea, vomiting and abdominal pain.  Patient was discharged from the hospital 24 hours ago in a stable condition, treated for abdominal pain due to gastroparesis.  This morning around 2 AM she had recurrent symptoms, severe nausea and vomiting, multiple times, associated with abdominal pain, dull in nature, no improving or worsening factors.  Due to persistent symptoms she came back to the hospital.  At home patient on multiple antiemetics along with antiacids.  She has been able to continue insulin therapy per her insulin pump.   ED Course: Patient was found dehydrated, ill looking appearing, hypokalemic, she was referred for admission for evaluation.  Review of Systems:  1. General: No fevers, no chills, no weight gain or weight loss/ generalized malaise.  2. ENT: No runny nose or sore throat, no hearing disturbances 3. Pulmonary: No dyspnea, cough, wheezing, or hemoptysis 4. Cardiovascular: No angina, claudication, lower extremity edema, pnd or orthopnea 5. Gastrointestinal: Positive nausea and vomiting, as mentioned in the HPI. no diarrhea or constipation 6. Hematology: No easy bruisability or frequent infections 7. Urology: No dysuria, hematuria or increased urinary frequency 8. Dermatology: No rashes. 9. Neurology: No seizures or paresthesias 10. Musculoskeletal: No joint pain or deformities  Past Medical History:  Diagnosis Date  . Anxiety   . Arthritis    "hands, feet, knees" (12/18/2016)  . Asthma   . Diabetic gastroparesis ([place])    Per gastric emptying study 07/09/16 which showed significant delayed gastric emptying.  . Gallstones    . Gastroparesis   . GERD (gastroesophageal reflux disease)   . Heart murmur   . Hepatic steatosis 11/26/2014   and hepatomegaly  . Hypertension    hx (12/18/2016)  . Intractable cyclical vomiting syndrome    Archie Endo 12/18/2016  . Liver mass 11/26/2014  . Pancreatitis, acute 11/26/2014  . Pneumonia    "as a teen X 1" (12/18/2016)  . Type I diabetes mellitus ([place]) 2007   IDDM.  poorly controlled, multiple admits with DKA    Past Surgical History:  Procedure Laterality Date  . CHOLECYSTECTOMY N/A 02/11/2015   Procedure: LAPAROSCOPIC CHOLECYSTECTOMY WITH INTRAOPERATIVE CHOLANGIOGRAM;  Surgeon: Greer Pickerel, MD;  Location: [facility] ORS;  Service: General;  Laterality: N/A;  . ESOPHAGOGASTRODUODENOSCOPY (EGD) WITH PROPOFOL Left 09/20/2014   Procedure: ESOPHAGOGASTRODUODENOSCOPY (EGD) WITH PROPOFOL;  Surgeon: Arta Silence, MD;  Location: [facility] ENDOSCOPY;  Service: Endoscopy;  Laterality: Left;  . WISDOM TOOTH EXTRACTION       reports that she has never smoked. She has never used smokeless tobacco. She reports that she has current or past drug history. Drug: Marijuana. She reports that she does not drink alcohol.  Allergies  Allergen Reactions  . Peanut-Containing Drug Products Swelling and Other (See Comments)    Reaction:  Swelling of mouth and lips   . 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   Reaction:  Swelling of mouth and lips   . Ultram [Tramadol] Itching    Family History  Problem Relation Age of Onset  . Heart disease Maternal Grandmother   . Heart disease Maternal Grandfather   . Diabetes Mother   . Hyperlipidemia Mother   .  Hypertension Father   . Heart disease Father   . Hypertension Paternal Grandmother   . Cancer Paternal Grandfather      Prior to Admission medications   Medication Sig Start Date End Date Taking? Authorizing Provider  albuterol (PROVENTIL HFA;VENTOLIN HFA) 108 (90 Base) MCG/ACT inhaler Inhale 1-2 puffs into the lungs  every 6 (six) hours as needed for wheezing or shortness of breath.    [provider]  blood glucose meter kit and supplies Dispense based on patient and insurance preference. Use up to four times daily as directed. (FOR ICD-10 E10.9, E11.9). 12/07/17   Eugenie Filler, MD  dicyclomine (BENTYL) 10 MG capsule Take 1 capsule (10 mg total) by mouth 3 (three) times daily as needed for spasms. 12/07/17   Eugenie Filler, MD  glucagon (GLUCAGON EMERGENCY) 1 MG injection Inject 1 mg into the muscle once as needed (For very low blood sugars. May repeat in 15-20 minutes if needed.). 12/20/16   Hongalgi, Lenis Dickinson, MD  insulin aspart (NOVOLOG FLEXPEN) 100 UNIT/ML FlexPen Use with mealtime insulin based on 1:15 carb count as well as correctional insulin per sliding scale (see below) Blood sugar <120 = 0 units Blood sugar 121-150 = 1 unit Blood sugar 150-200 = 2 units Blood sugar 201-250 = 3 units Blood sugar 251-300 = 5 units Blood sugar 301-350= 7 units Blood sugar 351 - 400 = 9 units Over 400 call your doctor 12/11/17   Elodia Florence., MD  Insulin Glargine (LANTUS) 100 UNIT/ML Solostar Pen Inject 25 Units into the skin daily. Patient not taking: Reported on 12/19/2017 12/11/17   Elodia Florence., MD  Insulin Human (INSULIN PUMP) SOLN Inject 0-9 each into the skin continuous. Novolog 1:15 carb count  BS<120= 0 units 121-150= 1 unit 150-200= 2 units 201-250= 3 units 251-300= 5 units 301-350= 7 units 351-400= 9 units >400 Call MD    [provider]  Insulin Pen Needle 31G X 5 MM MISC 30 Units by Does not apply route at bedtime. 12/07/17   Eugenie Filler, MD  metoCLOPramide (REGLAN) 10 MG tablet Take 1 tablet (10 mg total) by mouth 3 (three) times daily with meals. 11/07/17 11/07/18  Debbe Odea, MD  ondansetron (ZOFRAN ODT) 4 MG disintegrating tablet Take 1 tablet (4 mg total) by mouth every 8 (eight) hours as needed for nausea or vomiting. 11/07/17   Debbe Odea, MD  pantoprazole (PROTONIX) 40 MG tablet Take 1 tablet (40 mg total) by mouth daily. 12/21/17 01/20/18  Cristal Ford, DO  promethazine (PHENERGAN) 25 MG suppository Place 1 suppository (25 mg total) rectally every 6 (six) hours as needed for nausea or vomiting. 12/14/17   Etta Quill, NP  scopolamine (TRANSDERM-SCOP) 1 MG/3DAYS Place 1 patch (1.5 mg total) onto the skin every 3 (three) days as needed (during episodes of vomiting). 11/21/17   Debbe Odea, MD    Physical Exam: Vitals:   12/22/17 0716 12/22/17 0830 12/22/17 0931  BP: (!) 160/107 (!) 139/102 (!) 152/109  Pulse: (!) 115 (!) 109 (!) 109  Resp: 18 16 16   Temp: 98.2 F (36.8 C)    TempSrc: Oral    SpO2: 100% 98% 100%    Vitals:   12/22/17 0716 12/22/17 0830 12/22/17 0931  BP: (!) 160/107 (!) 139/102 (!) 152/109  Pulse: (!) 115 (!) 109 (!) 109  Resp: 18 16 16   Temp: 98.2 F (36.8 C)    TempSrc: Oral    SpO2: 100% 98% 100%  General: deconditioned and ill looking appearing.  Neurology: Awake and alert, non focal Head and Neck. Head normocephalic. Neck supple with no adenopathy or thyromegaly.   E ENT: positive pallor, no icterus, oral mucosa dry Cardiovascular: No JVD. S1-S2 present, rhythmic, no gallops, rubs, or murmurs. No lower extremity edema. Pulmonary: vesicular breath sounds bilaterally, adequate air movement, no wheezing, rhonchi or rales. Gastrointestinal. Abdomen distended, tender to deep palpation, no organomegaly, no rebound or guarding Skin. No rashes Musculoskeletal: no joint deformities    Labs on Admission: I have personally reviewed following labs and imaging studies  CBC: Recent Labs  Lab 12/16/17 0654 12/19/17 1216 12/22/17 0726  WBC 8.2 7.9 6.4  NEUTROABS 5.4 4.9 5.0  HGB 12.7 14.8 10.1*  HCT 39.4 46.1* 32.1*  MCV 89.7 88.7 92.0  PLT 472* 639* PLATELET CLUMPS NOTED ON SMEAR, UNABLE TO ESTIMATE   Basic Metabolic Panel: Recent Labs  Lab 12/16/17 0654 12/16/17 1036  12/19/17 1216 12/20/17 0641 12/21/17 0608 12/22/17 0726 12/22/17 0855  NA 137  --  138 139 141 142  --   K 5.2* 3.4* 3.2* 3.8 4.1 3.0*  --   CL 102  --  96* 104 108 110  --   CO2 24  --  28 27 25 23   --   GLUCOSE 189*  --  174* 162* 106* 157*  --   BUN 22*  --  19 12 10 6   --   CREATININE 0.69  --  0.71 0.70 0.60 0.41*  --   CALCIUM 9.6  --  10.5* 8.5* 8.3* 7.5*  --   MG  --   --   --   --   --   --  1.4*   GFR: Estimated Creatinine Clearance: 89.9 mL/min (A) (by C-G formula based on SCr of 0.41 mg/dL (L)). Liver Function Tests: Recent Labs  Lab 12/16/17 0654 12/19/17 1216 12/20/17 0641 12/22/17 0726  AST 53* 19 19 18   ALT 20 16 11 12   ALKPHOS 83 97 56 61  BILITOT 1.9* 1.0 0.5 0.5  PROT 7.7 9.2* 5.4* 6.4*  ALBUMIN 4.4 5.3* 3.0* 3.5   Recent Labs  Lab 12/16/17 0654 12/19/17 1216 12/22/17 0726  LIPASE 17 16 18    No results for input(s): AMMONIA in the last 168 hours. Coagulation Profile: No results for input(s): INR, PROTIME in the last 168 hours. Cardiac Enzymes: No results for input(s): CKTOTAL, CKMB, CKMBINDEX, TROPONINI in the last 168 hours. BNP (last 3 results) No results for input(s): PROBNP in the last 8760 hours. HbA1C: No results for input(s): HGBA1C in the last 72 hours. CBG: Recent Labs  Lab 12/20/17 2016 12/20/17 2309 12/21/17 0423 12/21/17 0740 12/21/17 0942  GLUCAP 82 291* 89 66* 81   Lipid Profile: No results for input(s): CHOL, HDL, LDLCALC, TRIG, CHOLHDL, LDLDIRECT in the last 72 hours. Thyroid Function Tests: No results for input(s): TSH, T4TOTAL, FREET4, T3FREE, THYROIDAB in the last 72 hours. Anemia Panel: No results for input(s): VITAMINB12, FOLATE, FERRITIN, TIBC, IRON, RETICCTPCT in the last 72 hours. Urine analysis:    Component Value Date/Time   COLORURINE AMBER (A) 12/20/2017 0413   APPEARANCEUR CLOUDY (A) 12/20/2017 0413   LABSPEC 1.011 12/20/2017 0413   PHURINE 8.0 12/20/2017 0413   GLUCOSEU >=500 (A) 12/20/2017 0413    GLUCOSEU >=1000 11/07/2012 1205   HGBUR LARGE (A) 12/20/2017 0413   BILIRUBINUR NEGATIVE 12/20/2017 0413   KETONESUR 20 (A) 12/20/2017 0413   PROTEINUR 100 (A) 12/20/2017 0413   UROBILINOGEN  0.2 11/24/2014 1045   NITRITE NEGATIVE 12/20/2017 0413   LEUKOCYTESUR LARGE (A) 12/20/2017 0413    Radiological Exams on Admission: Dg Abd Acute W/chest  Result Date: 12/22/2017 CLINICAL DATA:  Abdominal pain and nausea for 2 days EXAM: DG ABDOMEN ACUTE W/ 1V CHEST COMPARISON:  12/03/2017 FINDINGS: Cardiac shadow is within normal limits. The lungs are well aerated bilaterally. No focal infiltrate or sizable effusion is seen. Scattered large and small bowel gas is noted. Fecal material is noted throughout the colon consistent with a mild degree of constipation. No free air is seen. Changes of prior cholecystectomy are noted. No bony abnormality is noted. IMPRESSION: Mild constipation. No other focal abnormality is noted. Electronically Signed   By: Inez Catalina M.D.   On: 12/22/2017 09:46    EKG: Independently reviewed.  EKG normal sinus rhythm, normal axis, normal intervals.  Assessment/Plan Active Problems:   Intractable nausea and vomiting  27 year old female with type 1 diabetes mellitus and gastroparesis who presents 24 hours after discharge with recurrent symptoms of intractable nausea, vomiting and abdominal pain.  On her initial physical examination her blood pressure 139/102, heart rate 109, respiratory rate 22, oxygen saturation 100%, positive pallor, dry mucous membranes, lungs clear to auscultation bilaterally, heart S1-S2 present tachycardic, abdomen tender to deep palpation, no rebound or guarding, no lower extremity edema.  Sodium 142, potassium 3.0, chloride 110, bicarb 23, glucose 157, BUN 6, creatinine 0.41, white count 6.4, hemoglobin 10.1, hematocrit 32.1, platelets clumped.  Chest radiograph with no infiltrates, abdominal film no features of obstruction.  Patient will be admitted to  the hospital with a working diagnosis of refractive, recurrent, nausea and vomiting related to diabetic gastroparesis.  1.  Recurrent/refractive diabetic gastroparesis.  Patient will be admitted to the medical ward, she will be placed on isotonic fluids at 100 mL/h, continue symptomatic control with IV antiemetics (scheduled and as needed), IV analgesics and IV antiacids.  Clear liquid diet and advance as tolerated.  2.  Type 1 diabetes mellitus.  Continue insulin infusion per insulin pump, will add sliding scale for glucose coverage and monitoring.  Advance diet as tolerated.  There is no clinical signs of ketoacidosis.  3.  Hypokalemia and hypomagnesemia.  Likely related to dehydration, continue potassium and magnesium correction with potassium chloride and Mag sulfate intravenously, continue isotonic IV fluids, follow-up kidney function in the morning, avoid hypotension and nephrotoxic agents.  4.  Asthma.  No signs of exacerbation continue as needed albuterol.  DVT prophylaxis: enoxaparin  Code Status: full  Family Communication: I spoke with patient's aunt at the bedside and all questions were addressed.    Disposition Plan: med-surg   Consults called: none   Admission status:  Observation.    Ameirah Khatoon Gerome Apley MD [facility] Pager [phone]  If 7PM-7AM, please contact night-coverage [email or web address] Password [email or web address]  12/22/2017, 10:10 AM

## 2017-12-22 NOTE — ED Triage Notes (Signed)
Onset of abdominal pain at 2 am this morning. +n/v. Hx of gastroparesis.

## 2017-12-22 NOTE — ED Provider Notes (Signed)
[facility] DEPT Provider Note   CSN: [id number] Arrival date & time: 12/22/17  0704     History   Chief Complaint Chief Complaint  Patient presents with  . Abdominal Pain    HPI [name] is a 27 y.o. female with a history of diabetic gastroparesis, type 1 diabetes on insulin pump, pancreatitis, hypertension, cannabis hyperemesis & GERD who presents to the ED today for abdominal pain, nausea and vomiting. Patient reports that this morning she awoke around 2am this morning patient awoke with generalized abdominal pain, nausea, emesis.  Patient reports she has had 10 episodes of emesis prior to arrival.  She is tried Zofran and rectal phenergan for symptoms without any relief.  The patient reports this is similar to her history of gastroparesis in the past.  Patient was just discharged yesterday morning following an admission for diabetic gastroparesis with intractable nausea and vomiting.  Patient reports she has had a normal bowel movement since onset of her symptoms.  She denies any associated fever, chills, chest pain, flank pain, urinary symptoms, hematochezia or melena. She is followed by Dr. Paulita Fujita of GI.   HPI  Past Medical History:  Diagnosis Date  . Anxiety   . Arthritis    "hands, feet, knees" (12/18/2016)  . Asthma   . Diabetic gastroparesis ([place])    Per gastric emptying study 07/09/16 which showed significant delayed gastric emptying.  . Gallstones   . Gastroparesis   . GERD (gastroesophageal reflux disease)   . Heart murmur   . Hepatic steatosis 11/26/2014   and hepatomegaly  . Hypertension    hx (12/18/2016)  . Intractable cyclical vomiting syndrome    Archie Endo 12/18/2016  . Liver mass 11/26/2014  . Pancreatitis, acute 11/26/2014  . Pneumonia    "as a teen X 1" (12/18/2016)  . Type I diabetes mellitus ([place]) 2007   IDDM.  poorly controlled, multiple admits with DKA    Patient Active Problem List   Diagnosis Date Noted  .  Diffuse pain   . Elevated blood pressure reading 12/03/2017  . Tachycardia 11/30/2017  . Intractable vomiting with nausea 11/15/2017  . Hypernatremia 11/02/2017  . Nausea and vomiting 01/22/2017  . Nausea & vomiting 01/21/2017  . Intractable cyclical vomiting syndrome 12/18/2016  . Leukocytosis 10/24/2016  . N&V (nausea and vomiting) 10/23/2016  . DKA, type 1 ([place]) 09/26/2016  . Thrombocytosis ([place]) 07/08/2016  . Candiduria, asymptomatic 06/23/2016  . Hyponatremia 06/13/2016  . Hematuria 05/13/2016  . UTI (urinary tract infection) 01/22/2016  . Diarrhea 11/09/2015  . Acute urinary retention   . Gastroparesis due to DM ([place]) 07/10/2015  . GERD (gastroesophageal reflux disease) 07/10/2015  . Depression with anxiety 07/10/2015  . Gastroparesis 06/22/2015  . Altered mental status 06/22/2015  . Volume depletion 06/10/2015  . Protein-calorie malnutrition, severe 06/10/2015  . Hyperglycemia   . Elevated bilirubin   . Hematemesis with nausea   . Intractable nausea and vomiting 05/28/2015  . Abdominal pain in female   . Abdominal pain 05/24/2015  . Hypertension 05/24/2015  . Dehydration   . Diabetic gastroparesis ([place])   . Chronic diastolic heart failure ([place]) 04/11/2015  . Hematemesis 04/08/2015  . DKA (diabetic ketoacidoses) ([place]) 03/22/2015  . S/P laparoscopic cholecystectomy 02/11/2015  . Postextubation stridor   . Pancreatitis, acute 11/26/2014  . Volume overload 11/26/2014  . Hypokalemia 11/26/2014  . Hepatic steatosis 11/26/2014  . Liver mass 11/26/2014  . Sepsis ([place]) 11/25/2014  . Sinus tachycardia 11/25/2014  . Hypomagnesemia 11/25/2014  .  Hypophosphatemia 11/25/2014  . Elevated LFTs 11/24/2014  . AKI (acute kidney injury) ([place]) 11/24/2014  . Migraine headache 11/24/2014  . Asthma 06/29/2012  . Uncontrolled type 1 diabetes mellitus ([place]) 06/19/2010  . Goiter, unspecified 06/19/2010    Past Surgical History:  Procedure Laterality Date  . CHOLECYSTECTOMY N/A  02/11/2015   Procedure: LAPAROSCOPIC CHOLECYSTECTOMY WITH INTRAOPERATIVE CHOLANGIOGRAM;  Surgeon: Greer Pickerel, MD;  Location: [facility] ORS;  Service: General;  Laterality: N/A;  . ESOPHAGOGASTRODUODENOSCOPY (EGD) WITH PROPOFOL Left 09/20/2014   Procedure: ESOPHAGOGASTRODUODENOSCOPY (EGD) WITH PROPOFOL;  Surgeon: Arta Silence, MD;  Location: [facility] ENDOSCOPY;  Service: Endoscopy;  Laterality: Left;  . WISDOM TOOTH EXTRACTION       OB History    Gravida  2   Para  1   Term  0   Preterm  1   AB  1   Living  1     SAB  0   TAB  1   Ectopic  0   Multiple  0   Live Births  1            Home Medications    Prior to Admission medications   Medication Sig Start Date End Date Taking? Authorizing Provider  albuterol (PROVENTIL HFA;VENTOLIN HFA) 108 (90 Base) MCG/ACT inhaler Inhale 1-2 puffs into the lungs every 6 (six) hours as needed for wheezing or shortness of breath.    [provider]  blood glucose meter kit and supplies Dispense based on patient and insurance preference. Use up to four times daily as directed. (FOR ICD-10 E10.9, E11.9). 12/07/17   Eugenie Filler, MD  dicyclomine (BENTYL) 10 MG capsule Take 1 capsule (10 mg total) by mouth 3 (three) times daily as needed for spasms. 12/07/17   Eugenie Filler, MD  glucagon (GLUCAGON EMERGENCY) 1 MG injection Inject 1 mg into the muscle once as needed (For very low blood sugars. May repeat in 15-20 minutes if needed.). 12/20/16   Hongalgi, Lenis Dickinson, MD  insulin aspart (NOVOLOG FLEXPEN) 100 UNIT/ML FlexPen Use with mealtime insulin based on 1:15 carb count as well as correctional insulin per sliding scale (see below) Blood sugar <120 = 0 units Blood sugar 121-150 = 1 unit Blood sugar 150-200 = 2 units Blood sugar 201-250 = 3 units Blood sugar 251-300 = 5 units Blood sugar 301-350= 7 units Blood sugar 351 - 400 = 9 units Over 400 call your doctor 12/11/17   Elodia Florence., MD  Insulin Glargine (LANTUS) 100  UNIT/ML Solostar Pen Inject 25 Units into the skin daily. Patient not taking: Reported on 12/19/2017 12/11/17   Elodia Florence., MD  Insulin Human (INSULIN PUMP) SOLN Inject 0-9 each into the skin continuous. Novolog 1:15 carb count  BS<120= 0 units 121-150= 1 unit 150-200= 2 units 201-250= 3 units 251-300= 5 units 301-350= 7 units 351-400= 9 units >400 Call MD    [provider]  Insulin Pen Needle 31G X 5 MM MISC 30 Units by Does not apply route at bedtime. 12/07/17   Eugenie Filler, MD  metoCLOPramide (REGLAN) 10 MG tablet Take 1 tablet (10 mg total) by mouth 3 (three) times daily with meals. 11/07/17 11/07/18  Debbe Odea, MD  ondansetron (ZOFRAN ODT) 4 MG disintegrating tablet Take 1 tablet (4 mg total) by mouth every 8 (eight) hours as needed for nausea or vomiting. 11/07/17   Debbe Odea, MD  pantoprazole (PROTONIX) 40 MG tablet Take 1 tablet (40 mg total) by mouth daily.  12/21/17 01/20/18  Cristal Ford, DO  promethazine (PHENERGAN) 25 MG suppository Place 1 suppository (25 mg total) rectally every 6 (six) hours as needed for nausea or vomiting. 12/14/17   Etta Quill, NP  scopolamine (TRANSDERM-SCOP) 1 MG/3DAYS Place 1 patch (1.5 mg total) onto the skin every 3 (three) days as needed (during episodes of vomiting). 11/21/17   Debbe Odea, MD    Family History Family History  Problem Relation Age of Onset  . Heart disease Maternal Grandmother   . Heart disease Maternal Grandfather   . Diabetes Mother   . Hyperlipidemia Mother   . Hypertension Father   . Heart disease Father   . Hypertension Paternal Grandmother   . Cancer Paternal Grandfather     Social History Social History   Tobacco Use  . Smoking status: Never Smoker  . Smokeless tobacco: Never Used  Substance Use Topics  . Alcohol use: No  . Drug use: Yes    Types: Marijuana    Comment: 12/18/2016 q few days"     Allergies   Peanut-containing drug products; Food; and Ultram  [tramadol]   Review of Systems Review of Systems  All other systems reviewed and are negative.    Physical Exam Updated Vital Signs BP (!) 160/107 (BP Location: Right Arm)   Pulse (!) 115   Temp 98.2 F (36.8 C) (Oral)   Resp 18   LMP 12/06/2017 (Approximate)   SpO2 100%   Physical Exam  Constitutional: She appears well-developed and well-nourished.  HENT:  Head: Normocephalic and atraumatic.  Right Ear: External ear normal.  Left Ear: External ear normal.  Nose: Nose normal.  Mouth/Throat: Uvula is midline, oropharynx is clear and moist and mucous membranes are normal. No tonsillar exudate.  Eyes: Pupils are equal, round, and reactive to light. Right eye exhibits no discharge. Left eye exhibits no discharge. No scleral icterus.  Neck: Trachea normal. Neck supple. No spinous process tenderness present. No neck rigidity. Normal range of motion present.  Cardiovascular: Normal rate, regular rhythm and intact distal pulses.  No murmur heard. Pulses:      Radial pulses are 2+ on the right side, and 2+ on the left side.       Dorsalis pedis pulses are 2+ on the right side, and 2+ on the left side.       Posterior tibial pulses are 2+ on the right side, and 2+ on the left side.  Pulmonary/Chest: Effort normal and breath sounds normal. She exhibits no tenderness.  Abdominal: Soft. Bowel sounds are normal. She exhibits no distension. There is no tenderness. There is no rigidity, no rebound, no guarding, no CVA tenderness, no tenderness at McBurney's point and negative Murphy's sign.  Musculoskeletal: She exhibits no edema.  Lymphadenopathy:    She has no cervical adenopathy.  Neurological: She is alert.  Skin: Skin is warm and dry. No rash noted. She is not diaphoretic.  Psychiatric: She has a normal mood and affect.  Nursing note and vitals reviewed.    ED Treatments / Results  Labs (all labs ordered are listed, but only abnormal results are displayed) Labs Reviewed    COMPREHENSIVE METABOLIC PANEL - Abnormal; Notable for the following components:      Result Value   Potassium 3.0 (*)    Glucose, Bld 157 (*)    Creatinine, Ser 0.41 (*)    Calcium 7.5 (*)    Total Protein 6.4 (*)    All other components within normal limits  CBC WITH  DIFFERENTIAL/PLATELET - Abnormal; Notable for the following components:   RBC 3.49 (*)    Hemoglobin 10.1 (*)    HCT 32.1 (*)    All other components within normal limits  MAGNESIUM - Abnormal; Notable for the following components:   Magnesium 1.4 (*)    All other components within normal limits  LIPASE, BLOOD  URINALYSIS, ROUTINE W REFLEX MICROSCOPIC  I-STAT BETA HCG BLOOD, ED ([facility], [facility], [facility] ONLY)  CBG MONITORING, ED    EKG EKG Interpretation  Date/Time:  Wednesday December 22 2017 08:49:42 EST Ventricular Rate:  112 PR Interval:    QRS Duration: 86 QT Interval:  312 QTC Calculation: 426 R Axis:   82 Text Interpretation:  Sinus tachycardia Borderline T abnormalities, anterior leads Confirmed by Fredia Sorrow [phone]) on 12/22/2017 8:55:18 AM   Radiology Dg Abd Acute W/chest  Result Date: 12/22/2017 CLINICAL DATA:  Abdominal pain and nausea for 2 days EXAM: DG ABDOMEN ACUTE W/ 1V CHEST COMPARISON:  12/03/2017 FINDINGS: Cardiac shadow is within normal limits. The lungs are well aerated bilaterally. No focal infiltrate or sizable effusion is seen. Scattered large and small bowel gas is noted. Fecal material is noted throughout the colon consistent with a mild degree of constipation. No free air is seen. Changes of prior cholecystectomy are noted. No bony abnormality is noted. IMPRESSION: Mild constipation. No other focal abnormality is noted. Electronically Signed   By: Inez Catalina M.D.   On: 12/22/2017 09:46    Procedures Procedures (including critical care time)  Medications Ordered in ED Medications  potassium chloride 10 mEq in 100 mL IVPB (10 mEq Intravenous New Bag/Given 12/22/17 0919)  sodium chloride  0.9 % bolus 1,000 mL (has no administration in time range)  magnesium sulfate (IV Push/IM) injection 2 g (has no administration in time range)  metoCLOPramide (REGLAN) injection 10 mg (10 mg Intravenous Given 12/22/17 0822)  sodium chloride 0.9 % bolus 1,000 mL (1,000 mLs Intravenous New Bag/Given 12/22/17 2878)  morphine 4 MG/ML injection 4 mg (4 mg Intravenous Given 12/22/17 0822)  diphenhydrAMINE (BENADRYL) injection 25 mg (25 mg Intravenous Given 12/22/17 0823)  LORazepam (ATIVAN) injection 1 mg (1 mg Intravenous Given 12/22/17 0935)     Initial Impression / Assessment and Plan / ED Course  I have reviewed the triage vital signs and the nursing notes.  Pertinent labs & imaging results that were available during my care of the patient were reviewed by me and considered in my medical decision making (see chart for details).     27 y.o. female with a history of diabetic gastroparesis, type 1 diabetes on insulin pump, pancreatitis, hypertension, cannabis hyperemesis & GERD who presents to the ED today for abdominal pain, nausea and vomiting.  Patient was recently discharged from the hospital yesterday.  She notes that her symptoms began around 2 AM this morning.  She has had 10 episodes of emesis prior to arrival.  On arrival patient is noted to have tachycardia.  She is without fever and denies any focal abdominal pain, flank pain, urinary symptoms, diarrhea.  She has had a bowel movement since onset of symptoms.  She has tried Zofran and Phenergan at home without relief.  Labs without leukocytosis.  Patient is noted to have hypokalemia of 3.0.  IV potassium was ordered.  Magnesium low on labs and also replaced.  No acute kidney injury.  DG of abdomen without evidence of obstruction.   Patient given 2L of fluid.  Despite home Zofran, Phenergan and IV Reglan,  Benadryl and Ativan here patient is with continued emesis.  Will order Haldol.  Repeat abdominal exam without any peritoneal signs.  Feel  patient will benefit from readmission.  I appreciate the hospitalist for admitting the patient to their service.  Patient appears safe for admission.  Final Clinical Impressions(s) / ED Diagnoses   Final diagnoses:  Intractable nausea and vomiting  Hypokalemia    ED Discharge Orders    None       Lorelle Gibbs 12/22/17 1050    Fredia Sorrow, MD 12/25/17 [phone]

## 2017-12-23 DIAGNOSIS — Z91018 Allergy to other foods: Secondary | ICD-10-CM | POA: Diagnosis not present

## 2017-12-23 DIAGNOSIS — Z794 Long term (current) use of insulin: Secondary | ICD-10-CM | POA: Diagnosis not present

## 2017-12-23 DIAGNOSIS — Z9641 Presence of insulin pump (external) (internal): Secondary | ICD-10-CM | POA: Diagnosis present

## 2017-12-23 DIAGNOSIS — E860 Dehydration: Secondary | ICD-10-CM | POA: Diagnosis present

## 2017-12-23 DIAGNOSIS — Z79899 Other long term (current) drug therapy: Secondary | ICD-10-CM | POA: Diagnosis not present

## 2017-12-23 DIAGNOSIS — Z885 Allergy status to narcotic agent status: Secondary | ICD-10-CM | POA: Diagnosis not present

## 2017-12-23 DIAGNOSIS — K3184 Gastroparesis: Secondary | ICD-10-CM | POA: Diagnosis present

## 2017-12-23 DIAGNOSIS — Z8349 Family history of other endocrine, nutritional and metabolic diseases: Secondary | ICD-10-CM | POA: Diagnosis not present

## 2017-12-23 DIAGNOSIS — E10649 Type 1 diabetes mellitus with hypoglycemia without coma: Secondary | ICD-10-CM

## 2017-12-23 DIAGNOSIS — R112 Nausea with vomiting, unspecified: Secondary | ICD-10-CM | POA: Diagnosis not present

## 2017-12-23 DIAGNOSIS — K760 Fatty (change of) liver, not elsewhere classified: Secondary | ICD-10-CM | POA: Diagnosis present

## 2017-12-23 DIAGNOSIS — I10 Essential (primary) hypertension: Secondary | ICD-10-CM | POA: Diagnosis present

## 2017-12-23 DIAGNOSIS — E1143 Type 2 diabetes mellitus with diabetic autonomic (poly)neuropathy: Secondary | ICD-10-CM | POA: Diagnosis not present

## 2017-12-23 DIAGNOSIS — Z9049 Acquired absence of other specified parts of digestive tract: Secondary | ICD-10-CM | POA: Diagnosis not present

## 2017-12-23 DIAGNOSIS — Z833 Family history of diabetes mellitus: Secondary | ICD-10-CM | POA: Diagnosis not present

## 2017-12-23 DIAGNOSIS — Z8249 Family history of ischemic heart disease and other diseases of the circulatory system: Secondary | ICD-10-CM | POA: Diagnosis not present

## 2017-12-23 DIAGNOSIS — E1043 Type 1 diabetes mellitus with diabetic autonomic (poly)neuropathy: Secondary | ICD-10-CM | POA: Diagnosis present

## 2017-12-23 DIAGNOSIS — D649 Anemia, unspecified: Secondary | ICD-10-CM | POA: Diagnosis present

## 2017-12-23 DIAGNOSIS — Z91010 Allergy to peanuts: Secondary | ICD-10-CM | POA: Diagnosis not present

## 2017-12-23 DIAGNOSIS — F419 Anxiety disorder, unspecified: Secondary | ICD-10-CM | POA: Diagnosis present

## 2017-12-23 DIAGNOSIS — J45909 Unspecified asthma, uncomplicated: Secondary | ICD-10-CM | POA: Diagnosis present

## 2017-12-23 DIAGNOSIS — E876 Hypokalemia: Secondary | ICD-10-CM | POA: Diagnosis present

## 2017-12-23 DIAGNOSIS — K219 Gastro-esophageal reflux disease without esophagitis: Secondary | ICD-10-CM | POA: Diagnosis present

## 2017-12-23 LAB — CBC
HCT: 32.5 % — ABNORMAL LOW (ref 36.0–46.0)
Hemoglobin: 10 g/dL — ABNORMAL LOW (ref 12.0–15.0)
MCH: 28.2 pg (ref 26.0–34.0)
MCHC: 30.8 g/dL (ref 30.0–36.0)
MCV: 91.5 fL (ref 80.0–100.0)
Platelets: 420 10*3/uL — ABNORMAL HIGH (ref 150–400)
RBC: 3.55 MIL/uL — ABNORMAL LOW (ref 3.87–5.11)
RDW: 14.4 % (ref 11.5–15.5)
WBC: 6.9 10*3/uL (ref 4.0–10.5)
nRBC: 0 % (ref 0.0–0.2)

## 2017-12-23 LAB — BASIC METABOLIC PANEL
Anion gap: 8 (ref 5–15)
BUN: 5 mg/dL — ABNORMAL LOW (ref 6–20)
CO2: 23 mmol/L (ref 22–32)
Calcium: 8.3 mg/dL — ABNORMAL LOW (ref 8.9–10.3)
Chloride: 107 mmol/L (ref 98–111)
Creatinine, Ser: 0.63 mg/dL (ref 0.44–1.00)
GFR calc Af Amer: >60 mL/min (ref >60)
GFR calc non Af Amer: >60 mL/min (ref >60)
GLUCOSE: 373 mg/dL — AB (ref 70–99)
Potassium: 4.4 mmol/L (ref 3.5–5.1)
Sodium: 138 mmol/L (ref 135–145)

## 2017-12-23 LAB — GLUCOSE, CAPILLARY
GLUCOSE-CAPILLARY: 320 mg/dL — AB (ref 70–99)
GLUCOSE-CAPILLARY: 72 mg/dL (ref 70–99)
Glucose-Capillary: 32 mg/dL — CL (ref 70–99)
Glucose-Capillary: 82 mg/dL (ref 70–99)
Glucose-Capillary: 137 mg/dL — ABNORMAL HIGH (ref 70–99)
Glucose-Capillary: 40 mg/dL — CL (ref 70–99)
Glucose-Capillary: 65 mg/dL — ABNORMAL LOW (ref 70–99)
Glucose-Capillary: 88 mg/dL (ref 70–99)

## 2017-12-23 LAB — MAGNESIUM: Magnesium: 1.9 mg/dL (ref 1.7–2.4)

## 2017-12-23 MED ORDER — FAMOTIDINE 20 MG PO TABS
20.0000 mg | ORAL_TABLET | Freq: Two times a day (BID) | ORAL | Status: DC
  Administered 2017-12-23 (×2): 20 mg via ORAL
  Filled 2017-12-23 (×3): qty 1

## 2017-12-23 MED ORDER — PANTOPRAZOLE SODIUM 40 MG PO TBEC
40.0000 mg | DELAYED_RELEASE_TABLET | Freq: Every day | ORAL | Status: DC
  Administered 2017-12-23: 40 mg via ORAL
  Filled 2017-12-23 (×2): qty 1

## 2017-12-23 MED ORDER — OXYCODONE HCL 5 MG PO TABS
5.0000 mg | ORAL_TABLET | Freq: Four times a day (QID) | ORAL | Status: DC | PRN
  Administered 2017-12-23 – 2017-12-26 (×5): 5 mg via ORAL
  Filled 2017-12-23 (×5): qty 1

## 2017-12-23 NOTE — Progress Notes (Signed)
PROGRESS NOTE    [name]  TWS:[id number] DOB: 12/13/1990 DOA: 12/22/2017 PCP: Vicenta Aly, FNP   Brief Narrative:  HPI per Dr. Sander Radon on 12/22/17 [name] is a 27 y.o. female with medical history significant of diabetic gastroparesis, and type 1 diabetes mellitus, who presents with recurrent nausea, vomiting and abdominal pain.  Patient was discharged from the hospital 24 hours ago in a stable condition, treated for abdominal pain due to gastroparesis.  This morning around 2 AM she had recurrent symptoms, severe nausea and vomiting, multiple times, associated with abdominal pain, dull in nature, no improving or worsening factors.  Due to persistent symptoms she came back to the hospital.  At home patient on multiple antiemetics along with antiacids.  She has been able to continue insulin therapy per her insulin pump.  ED Course: Patient was found dehydrated, ill looking appearing, hypokalemic, she was referred for admission for evaluation.  **Patient felt better and nausea vomiting resolved.  Hospitalization has been complicated by severe hypoglycemia with several episodes.  Patient has been self administering boluses and diabetes education coronary is been consulted.  If patient continues to have lows we will likely discontinue insulin pump and give her a hospital dictated sliding scale.  Assessment & Plan:   Active Problems:   Intractable nausea and vomiting  Recurrent/refractive diabetic gastroparesis with associated Nausea and Vomiting -Improving now -Patient was admitted to the medical ward and was placed on isotonic fluids at 100 mL/hr which is now reduced to 75 mL/hr -Continue symptomatic control with IV antiemetics with IV Promethazine 25 mg q6hprn N/V/Refractory N/V.  -C/w po Ondansetron 4 mg q8hprn Nausea/Vomiting  -C/w Metoclopramide 10 mg po TID -C/w Scopolamine 1.5 TD Patch  -IV analgesics with Morphine changed to po Oxycodone 5 mg q6h -IV  Famotidine changed to po 20 mg po BID -Restarted po Pantoprazole 40 mg po Daily -C/w Dicyclomine 10 mg po TIDprn Spasms -Clear liquid diet and advance as tolerated to Soft Diet -If patient tolerates Soft Diet without issues can be D/C'd Hom e in AM   Type 1 Diabetes Mellitus complicated by significant Hypoglycemia  -Continue insulin infusion per insulin pump,  -Added sliding scale for glucose coverage and monitoring but patient self bolusing and had a low yesterday.   -Diabetes Education consulted for further evaluation and recommendation -Diet has been Advanced as tolerated to Soft Diet. -There is no clinical signs of ketoacidosis. -CBG's ranging from 40-137; Had a high of 320 -Iron is to get all CBGs with hospital meter and document all insulin boluses given by the patient per pump; Patient had another low of 40 this Afternoon and ? If she gave herself too much Insulin Bolus; Nursing reported she gave herself 7 units -Will need to monitor extremely carefully about self boluses -Target CBG's are supposed to be between 100-150 -Diabetes Education Coordinator to Educate further -Continue to Monitor CBG's very carefully -Basal Rate is 1.8 units/hr all day  -Diet Advanced to Soft Diet  -If she continues to have significant lows and not adhere to Diabetes Education Coordinator will discontinue her insulin pump and give her via hospital protocol  Hypokalemia  Hypomagnesemia -Improved -Mag and K+ Improved -Continue to Monitor and Replete as Necessary  -Repeat CMP and Mag Level in AM   Asthma -No signs of exacerbation  -C/w Albuterol 2.5 mg IH q6hprn Wheezing and SOB  Normocytic Anemia -Patient's hemoglobin/hematocrit went from 10.1/32.1 is now 10.0/32.5 -Continue to monitor for signs and symptoms of bleeding -Check anemia  panel in a.m. -Repeat CBC in the a.m.  DVT prophylaxis: Enoxaparin 40 mg sq q24h Code Status: FULL CODE Family Communication: No family present at  bedside Disposition Plan:   Consultants:   None  Procedures:  None   Antimicrobials:  Anti-infectives (From admission, onward)   None     Subjective: Seen and examined at bedside and is feeling better.  He denies any nausea, vomiting or abdominal pain.  Wanting to eat soft diet.  Hospitalization has been complicated by significant lows with her insulin pump.  No chest pain, lightheadedness or dizziness.  No other concerns or complaints at this time.  Objective: Vitals:   12/23/17 0103 12/23/17 0546 12/23/17 1354 12/23/17 1617  BP: 115/81 121/86 120/79   Pulse: 95 94 100   Resp: 16 16 20    Temp: 98.9 F (37.2 C) 98.5 F (36.9 C) 98.9 F (37.2 C)   TempSrc: Oral Oral Oral   SpO2:  97% 100% 100%  Weight: 54.4 kg     Height: 5\' 4"  (1.626 m)       Intake/Output Summary (Last 24 hours) at 12/23/2017 1704 Last data filed at 12/23/2017 0420 Gross per 24 hour  Intake 1970.33 ml  Output 900 ml  Net 1070.33 ml   Filed Weights   12/23/17 0103  Weight: 54.4 kg   Examination: Physical Exam:  Constitutional: WN/WD thin AAF currently in NAD and appears calm and comfortable Eyes: PERRL, lids and conjunctivae normal, sclerae anicteric  ENMT: External Ears, Nose appear normal. Grossly normal hearing. Mucous membranes are moist. Posterior pharynx clear of any exudate or lesions. Normal dentition.  Neck: Appears normal, supple, no cervical masses, normal ROM, no appreciable thyromegaly; no JVD Respiratory: Diminished to auscultation bilaterally, no wheezing, rales, rhonchi or crackles.  Cardiovascular: RRR, no murmurs / rubs / gallops. S1 and S2 auscultated. No LE extremity edema. 2+ pedal pulses. No carotid bruits.  Abdomen: Soft, non-tender, non-distended. No masses palpated. No appreciable hepatosplenomegaly. Bowel sounds positive x4. Has an insulin pump GU: Deferred. Musculoskeletal: No clubbing / cyanosis of digits/nails.  Normal strength and muscle tone.  Skin: No rashes,  lesions, ulcers on a limited skin evaluation. No induration; Warm and dry.  Neurologic: CN 2-12 grossly intact with no focal deficits. Romberg sign and cerebellar reflexes not assessed.  Psychiatric: Normal judgment and insight. Alert and oriented x 3. Normal mood and appropriate affect.   Data Reviewed: I have personally reviewed following labs and imaging studies  CBC: Recent Labs  Lab 12/19/17 1216 12/22/17 0726 12/23/17 0350  WBC 7.9 6.4 6.9  NEUTROABS 4.9 5.0  --   HGB 14.8 10.1* 10.0*  HCT 46.1* 32.1* 32.5*  MCV 88.7 92.0 91.5  PLT 639* PLATELET CLUMPS NOTED ON SMEAR, UNABLE TO ESTIMATE 128*   Basic Metabolic Panel: Recent Labs  Lab 12/19/17 1216 12/20/17 0641 12/21/17 0608 12/22/17 0726 12/22/17 0855 12/23/17 0350  NA 138 139 141 142  --  138  K 3.2* 3.8 4.1 3.0*  --  4.4  CL 96* 104 108 110  --  107  CO2 28 27 25 23   --  23  GLUCOSE 174* 162* 106* 157*  --  373*  BUN 19 12 10 6   --  5*  CREATININE 0.71 0.70 0.60 0.41*  --  0.63  CALCIUM 10.5* 8.5* 8.3* 7.5*  --  8.3*  MG  --   --   --   --  1.4* 1.9   GFR: Estimated Creatinine Clearance: 89.9 mL/min (by  C-G formula based on SCr of 0.63 mg/dL). Liver Function Tests: Recent Labs  Lab 12/19/17 1216 12/20/17 0641 12/22/17 0726  AST 19 19 18   ALT 16 11 12   ALKPHOS 97 56 61  BILITOT 1.0 0.5 0.5  PROT 9.2* 5.4* 6.4*  ALBUMIN 5.3* 3.0* 3.5   Recent Labs  Lab 12/19/17 1216 12/22/17 0726  LIPASE 16 18   No results for input(s): AMMONIA in the last 168 hours. Coagulation Profile: No results for input(s): INR, PROTIME in the last 168 hours. Cardiac Enzymes: No results for input(s): CKTOTAL, CKMB, CKMBINDEX, TROPONINI in the last 168 hours. BNP (last 3 results) No results for input(s): PROBNP in the last 8760 hours. HbA1C: No results for input(s): HGBA1C in the last 72 hours. CBG: Recent Labs  Lab 12/22/17 2319 12/23/17 0358 12/23/17 0750 12/23/17 1221 12/23/17 1648  GLUCAP 82 320* 72 137* 40*    Lipid Profile: No results for input(s): CHOL, HDL, LDLCALC, TRIG, CHOLHDL, LDLDIRECT in the last 72 hours. Thyroid Function Tests: No results for input(s): TSH, T4TOTAL, FREET4, T3FREE, THYROIDAB in the last 72 hours. Anemia Panel: No results for input(s): VITAMINB12, FOLATE, FERRITIN, TIBC, IRON, RETICCTPCT in the last 72 hours. Sepsis Labs: No results for input(s): PROCALCITON, LATICACIDVEN in the last 168 hours.  Recent Results (from the past 240 hour(s))  Urine culture     Status: Abnormal   Collection Time: 12/16/17  8:30 AM  Result Value Ref Range Status   Specimen Description   Final    URINE, CLEAN CATCH Performed at [facility], [place] [address], [place], [place] [address]    Special Requests   Final    NONE Performed at [facility], [place] [address], [place], [place] [address]    Culture (A)  Final    10,000 COLONIES/mL DIPHTHEROIDS(CORYNEBACTERIUM SPECIES)   Report Status 12/17/2017 FINAL  Final    Radiology Studies: Dg Abd Acute W/chest  Result Date: 12/22/2017 CLINICAL DATA:  Abdominal pain and nausea for 2 days EXAM: DG ABDOMEN ACUTE W/ 1V CHEST COMPARISON:  12/03/2017 FINDINGS: Cardiac shadow is within normal limits. The lungs are well aerated bilaterally. No focal infiltrate or sizable effusion is seen. Scattered large and small bowel gas is noted. Fecal material is noted throughout the colon consistent with a mild degree of constipation. No free air is seen. Changes of prior cholecystectomy are noted. No bony abnormality is noted. IMPRESSION: Mild constipation. No other focal abnormality is noted. Electronically Signed   By: Inez Catalina M.D.   On: 12/22/2017 09:46   Scheduled Meds: . enoxaparin (LOVENOX) injection  40 mg Subcutaneous Q24H  . famotidine  20 mg Oral BID  . insulin pump   Subcutaneous Q4H  . metoCLOPramide  10 mg Oral TID WC  . pantoprazole  40 mg Oral Daily   Continuous Infusions: . sodium chloride 75 mL/hr  at 12/22/17 2255    LOS: 0 days   Kerney Elbe, DO [facility] PAGER is on AMION  If 7PM-7AM, please contact night-coverage [email or web address] Password [email or web address] 12/23/2017, 5:04 PM

## 2017-12-23 NOTE — Progress Notes (Signed)
CBG at 1645 was 40 ,gave a 268mL of ginger ale and 113ml of apple juice. CBG rechecked in 15 mins was 65. CBG rechecked again at 1730 was 88. Dr Chana Bode notified.

## 2017-12-23 NOTE — Progress Notes (Signed)
CRITICAL VALUE ALERT  Critical Value:  CBG 40 Date & Time Notied:  12/23/17, 1715  Provider Notified: DR Chana Bode  Orders Received/Actions taken: YES

## 2017-12-23 NOTE — Progress Notes (Signed)
CBG at 2230 was 30 - gave 1223mL of juice and 335mL of chicken broth.  CBG rechecked at 2255 was 32-gave 2 bowls of chicken noodle soup / 356mL of juice and 1 glucose tablet of 4mg .  CBG rechecked at 2319 - was 82. No more interventions were done for this CBG  CBG checked at 3:55am; it was 320 - patient gave herself 3units via insulin pump

## 2017-12-23 NOTE — Progress Notes (Addendum)
Inpatient Diabetes Program Recommendations  AACE/ADA: New Consensus Statement on Inpatient Glycemic Control (2015)  Target Ranges:  Prepandial:   less than 140 mg/dL      Peak postprandial:   less than 180 mg/dL (1-2 hours)      Critically ill patients:  140 - 180 mg/dL   Results for [name], [name] (MRN [id number]) as of 12/23/2017 08:20  Ref. Range 12/22/2017 15:36 12/22/2017 16:18 12/22/2017 19:51 12/22/2017 22:36 12/22/2017 22:57  Glucose-Capillary Latest Ref Range: 70 - 99 mg/dL 34 (LL) 81 77 30 (LL) 32 (LL)   Results for [name], [name] (MRN [id number]) as of 12/23/2017 08:20  Ref. Range 12/22/2017 23:19 12/23/2017 03:58 12/23/2017 07:50  Glucose-Capillary Latest Ref Range: 70 - 99 mg/dL 82 320 (H)  3 units NOVOLOG given by pt with Insulin Pump 68    Admit with: N&V  History: Type 1 DM, Gastroparesis  Home DM Meds: Insulin Pump  Current Orders: Insulin Pump Q4 hours     Just discharged from [facility] hospital on 12/03 after admission on 12/01 for the same issues.  Severe Hypoglycemia yesterday at 3:30pm, 10:30pm, 11pm.  Hypoglycemic likely from continued N&V.  Allowed CL diet this AM.  --Insulin Pump Settings--  Basal Rates: 1.8 units/hr all day  Total Basal Insulin per 24 hours period= 43.2 units  Carbohydrate Ratio: 1 unit for every 10 Grams of Carbohydrates  Correction/Sensitivity Factor: 1 unit for every 66 mg/dl above Target CBG  Target CBG: 100-150 mg/dl   Addendum 12:30pm- Met with pt today.  Pt A&O and not having any pain or N&V when I met with her around 12:30pm.  Appropriate to use insulin pump in hospital.  Has insulin pump on and running.  No changes to insulin pump settings since I saw her on 12/20/2017.  Changed set/site/reservoir/insulin on 12/04.  Does not have extra pump supplies but can have family bring more supplies if necessary.  Discussed low CBGs with patient from yest.  Pt told me she suspended her pump last night at bedtime when she had the  low CBGs and then restarted her pump around 4am when her CBg rose to 320 mg/dl.  Pt told me she ate some mashed potatoes, green beans, 1 chicken tender, and some pears at lunch.  Took 7 unit bolus on her pump for this.  Will follow while hospitalized.  Spoke with RN caring for pt.  Reviewed all documentation responsibilities with RN.  RN to get all CBGs with hospital meter and document all insulin boluses given by pt per pump.   --Will follow patient during hospitalization--  Wyn Quaker RN, MSN, CDE Diabetes Coordinator Inpatient Glycemic Control Team Team Pager: [phone] (8a-5p)

## 2017-12-24 LAB — CBC WITH DIFFERENTIAL/PLATELET
Abs Immature Granulocytes: 0.07 10*3/uL (ref 0.00–0.07)
BASOS ABS: 0 10*3/uL (ref 0.0–0.1)
Basophils Relative: 1 %
Eosinophils Absolute: 0.3 10*3/uL (ref 0.0–0.5)
Eosinophils Relative: 4 %
HCT: 31.9 % — ABNORMAL LOW (ref 36.0–46.0)
Hemoglobin: 10.3 g/dL — ABNORMAL LOW (ref 12.0–15.0)
IMMATURE GRANULOCYTES: 1 %
Lymphocytes Relative: 36 %
Lymphs Abs: 2.3 10*3/uL (ref 0.7–4.0)
MCH: 28.5 pg (ref 26.0–34.0)
MCHC: 32.3 g/dL (ref 30.0–36.0)
MCV: 88.1 fL (ref 80.0–100.0)
Monocytes Absolute: 0.5 10*3/uL (ref 0.1–1.0)
Monocytes Relative: 8 %
Neutro Abs: 3.2 10*3/uL (ref 1.7–7.7)
Neutrophils Relative %: 50 %
Platelets: 377 10*3/uL (ref 150–400)
RBC: 3.62 MIL/uL — ABNORMAL LOW (ref 3.87–5.11)
RDW: 14.2 % (ref 11.5–15.5)
WBC: 6.2 10*3/uL (ref 4.0–10.5)
nRBC: 0 % (ref 0.0–0.2)

## 2017-12-24 LAB — GLUCOSE, CAPILLARY
GLUCOSE-CAPILLARY: 148 mg/dL — AB (ref 70–99)
Glucose-Capillary: 351 mg/dL — ABNORMAL HIGH (ref 70–99)
Glucose-Capillary: 135 mg/dL — ABNORMAL HIGH (ref 70–99)
Glucose-Capillary: 93 mg/dL (ref 70–99)
Glucose-Capillary: 331 mg/dL — ABNORMAL HIGH (ref 70–99)
Glucose-Capillary: 163 mg/dL — ABNORMAL HIGH (ref 70–99)
Glucose-Capillary: 85 mg/dL (ref 70–99)
Glucose-Capillary: 48 mg/dL — ABNORMAL LOW (ref 70–99)

## 2017-12-24 LAB — COMPREHENSIVE METABOLIC PANEL
ALT: 10 U/L (ref 0–44)
AST: 18 U/L (ref 15–41)
Albumin: 2.9 g/dL — ABNORMAL LOW (ref 3.5–5.0)
Alkaline Phosphatase: 63 U/L (ref 38–126)
Anion gap: 9 (ref 5–15)
BUN: 5 mg/dL — ABNORMAL LOW (ref 6–20)
CO2: 23 mmol/L (ref 22–32)
Calcium: 8.4 mg/dL — ABNORMAL LOW (ref 8.9–10.3)
Chloride: 107 mmol/L (ref 98–111)
Creatinine, Ser: 0.47 mg/dL (ref 0.44–1.00)
GFR calc Af Amer: >60 mL/min (ref >60)
GFR calc non Af Amer: >60 mL/min (ref >60)
Glucose, Bld: 106 mg/dL — ABNORMAL HIGH (ref 70–99)
POTASSIUM: 4.4 mmol/L (ref 3.5–5.1)
Sodium: 139 mmol/L (ref 135–145)
Total Bilirubin: 0.4 mg/dL (ref 0.3–1.2)
Total Protein: 5.4 g/dL — ABNORMAL LOW (ref 6.5–8.1)

## 2017-12-24 LAB — MAGNESIUM: Magnesium: 1.6 mg/dL — ABNORMAL LOW (ref 1.7–2.4)

## 2017-12-24 LAB — PHOSPHORUS: Phosphorus: 4 mg/dL (ref 2.5–4.6)

## 2017-12-24 MED ORDER — DEXTROSE 50 % IV SOLN
INTRAVENOUS | Status: AC
  Administered 2017-12-24: 50 mL
  Administered 2017-12-25: 25
  Filled 2017-12-24: qty 50

## 2017-12-24 MED ORDER — MORPHINE SULFATE (PF) 2 MG/ML IV SOLN
1.0000 mg | INTRAVENOUS | Status: DC | PRN
  Administered 2017-12-24 – 2017-12-25 (×8): 1 mg via INTRAVENOUS
  Filled 2017-12-24 (×7): qty 1

## 2017-12-24 MED ORDER — METOCLOPRAMIDE HCL 5 MG/ML IJ SOLN
10.0000 mg | Freq: Once | INTRAMUSCULAR | Status: AC
  Administered 2017-12-24: 10 mg via INTRAVENOUS
  Filled 2017-12-24: qty 2

## 2017-12-24 MED ORDER — ONDANSETRON HCL 4 MG/2ML IJ SOLN
4.0000 mg | Freq: Four times a day (QID) | INTRAMUSCULAR | Status: DC | PRN
  Administered 2017-12-24 – 2017-12-26 (×6): 4 mg via INTRAVENOUS
  Filled 2017-12-24 (×7): qty 2

## 2017-12-24 MED ORDER — PANTOPRAZOLE SODIUM 40 MG IV SOLR
40.0000 mg | INTRAVENOUS | Status: DC
  Administered 2017-12-24: 40 mg via INTRAVENOUS
  Filled 2017-12-24: qty 40

## 2017-12-24 MED ORDER — SODIUM CHLORIDE 0.9 % IV BOLUS
1000.0000 mL | Freq: Once | INTRAVENOUS | Status: AC
  Administered 2017-12-24: 1000 mL via INTRAVENOUS

## 2017-12-24 MED ORDER — MAGNESIUM SULFATE 2 GM/50ML IV SOLN
2.0000 g | Freq: Once | INTRAVENOUS | Status: AC
  Administered 2017-12-24: 2 g via INTRAVENOUS
  Filled 2017-12-24: qty 50

## 2017-12-24 MED ORDER — MORPHINE SULFATE (PF) 2 MG/ML IV SOLN
INTRAVENOUS | Status: AC
  Filled 2017-12-24: qty 1

## 2017-12-24 MED ORDER — FAMOTIDINE IN NACL 20-0.9 MG/50ML-% IV SOLN
20.0000 mg | Freq: Two times a day (BID) | INTRAVENOUS | Status: DC
  Administered 2017-12-24 (×2): 20 mg via INTRAVENOUS
  Filled 2017-12-24 (×4): qty 50

## 2017-12-24 NOTE — Progress Notes (Signed)
CRITICAL VALUE ALERT  Critical Value:  48  Date & Time Notied:  12/24/2017 8:36 PM   Provider Notified:   Orders Received/Actions taken: 25 AMP of D50 will recheck in 15 min.

## 2017-12-24 NOTE — Progress Notes (Signed)
PROGRESS NOTE    [name]  DJT:[id number] DOB: Sep 14, 1990 DOA: 12/22/2017 PCP: Vicenta Aly, FNP   Brief Narrative:  HPI per Dr. Sander Radon on 12/22/17 [name] is a 27 y.o. female with medical history significant of diabetic gastroparesis, and type 1 diabetes mellitus, who presents with recurrent nausea, vomiting and abdominal pain.  Patient was discharged from the hospital 24 hours ago in a stable condition, treated for abdominal pain due to gastroparesis.  This morning around 2 AM she had recurrent symptoms, severe nausea and vomiting, multiple times, associated with abdominal pain, dull in nature, no improving or worsening factors.  Due to persistent symptoms she came back to the hospital.  At home patient on multiple antiemetics along with antiacids.  She has been able to continue insulin therapy per her insulin pump.  ED Course: Patient was found dehydrated, ill looking appearing, hypokalemic, she was referred for admission for evaluation.  *Hospitalization has been complicated by severe hypoglycemia with several episodes.  Patient has been self administering boluses and diabetes education coronary is been consulted.  If patient continues to have lows we will likely discontinue insulin pump and give her a hospital dictated sliding scale.  Morning patient became acutely nauseous and started vomiting violently and started stating that her abdomen was hurting again.  Her diet was cut back to a full liquid diet and her medications were transitioned to IV again that she kept vomiting and had lost IV access.  We will continue to monitor her very carefully and may need gastroenterology involvement if continues to worsen.  Assessment & Plan:   Active Problems:   Intractable nausea and vomiting   Intractable vomiting with nausea  Recurrent/refractive diabetic gastroparesis with associated Nausea and Vomiting -Acutely worsened this AM and because violently ill and  nauseous and vomiting  -IVF increased to 100 mL/hr -Continue symptomatic control with IV antiemetics with IV Promethazine 25 mg q6hprn N/V/Refractory N/V.  -C/w po Ondansetron 4 mg q8hprn Nausea/Vomiting  -C/w Metoclopramide 10 mg po TID -C/w Scopolamine 1.5 TD Patch  -IV analgesics with Morphine changed to po Oxycodone 5 mg q6h but changed back to IV today due to her Acute Nausea and Vomiting -IV Famotidine changed to po 20 mg po BID but changed back to IV today  -Restarted po Pantoprazole 40 mg po Daily and changed to IV Pantoprazole  -C/w Dicyclomine 10 mg po TIDprn Spasms -Clear liquid diet and advance as tolerated to Soft Diet but now cut back to FULL Liquid Diet -Was planning on D/Cing the patient today as she did well but became Acutely ill again -Continue to Monitor Extremely carefully and may discuss with Gastroenterology   Type 1 Diabetes Mellitus complicated by significant Hypoglycemia  -Continue insulin infusion per insulin pump,  -Added sliding scale for glucose coverage and monitoring but patient self bolusing and had a low yesterday.   -Diabetes Education consulted for further evaluation and recommendation -Diet has been Advanced as tolerated to Soft Diet. -There is no clinical signs of ketoacidosis. -CBG's ranging from 88-351; Had a high of 331 -She is to get all CBGs with hospital meter and document all insulin boluses given by the patient per pump;  -Will need to monitor extremely carefully about self boluses -Target CBG's are supposed to be between 100-150 -Diabetes Education Coordinator to Educate further -Continue to Monitor CBG's very carefully -Basal Rate is 1.8 units/hr all day  -Diet Advanced to Soft Diet but cut back to FULL Given Acute Nausea and Vomiting -If  she continues to have significant lows and not adhere to Diabetes Education Coordinator will discontinue her insulin pump and give her via hospital protocol -She is to follow up with Dr. Hartford Poli of  Endocrinology as an outpatient   Hypokalemia  -K+ was improved  -Continue to Monitor and Replete as Necessary  -Repeat CMP and in AM   Hypomagnesemia -Mag Level this AM was 1.6 -Replete with IV Mag Sulfate 2 grams -Continue to Monitor and Replete as Necessary  -Repeat Mag Level in AM   Asthma -No signs of exacerbation  -C/w Albuterol 2.5 mg IH q6hprn Wheezing and SOB  Normocytic Anemia -Patient's hemoglobin/hematocrit went from 10.1/32.1 is now 10.3/31.9 -Continue to monitor for signs and symptoms of bleeding -Check anemia panel in a.m. -Repeat CBC in the a.m.  DVT prophylaxis: Enoxaparin 40 mg sq q24h Code Status: FULL CODE Family Communication: No family present at bedside Disposition Plan: Remain inpatient he became acutely ill this morning and became extremely nauseous and vomiting.  Had a difficult time keeping her medications down later changed to IV."  The diet back to full liquid diet and will not discharge until she is tolerating soft diet without any nausea or vomiting and blood sugars being better controlled.  Consultants:   None  Procedures:  None   Antimicrobials:  Anti-infectives (From admission, onward)   None     Subjective: Seen and examined at bedside and worsened acutely and started having extreme nausea vomiting was bent over the garbage can vomiting.  No chest pain, lightheadedness or dizziness but does not feel well and states her abdomen was hurting.  No lightheadedness or dizziness and threw up her medications this morning.  No other concerns recommend at this time  Objective: Vitals:   12/23/17 1617 12/23/17 2045 12/24/17 0446 12/24/17 1512  BP:  (!) 130/93 (!) 128/92 (!) 160/107  Pulse:  95 92 (!) 125  Resp:  17 17   Temp:  98.3 F (36.8 C) 98.7 F (37.1 C) 99.5 F (37.5 C)  TempSrc:  Oral Oral Oral  SpO2: 100% 100% 100% 100%  Weight:      Height:        Intake/Output Summary (Last 24 hours) at 12/24/2017 1628 Last data filed at  12/24/2017 1150 Gross per 24 hour  Intake 405 ml  Output -  Net 405 ml   Filed Weights   12/23/17 0103  Weight: 54.4 kg   Examination: Physical Exam:  Constitutional: Well-nourished, well-developed thin African-American female is currently in some distress and appears extremely uncomfortable and is vomiting actively ENMT: External ears and nose appear normal.  Grossly normal hearing. mucous membranes are slightly dry Neck: Appears supple no JVD Respiratory: Diminished but no appreciable wheezing, rales, rhonchi.  Unlabored breathing and is not wearing any supplemental oxygen via nasal cannula Cardiovascular: Tachycardic rate but regular rhythm.  No lower extremity edema Abdomen: Soft, tender to palpate.  Nondistended.  Bowel sounds present all 4 quadrants and has insulin pump GU: Deferred Musculoskeletal: Contractures cyanosis Skin: No appreciable rashes or lesions limited skin evaluation but does have scattered tattoos throughout body Neurologic: Cranial nerves II through XII grossly intact no appreciable focal deficits Psychiatric: Normal judgment and insight.  Patient is awake alert and oriented.  Appears distressed from vomiting  Data Reviewed: I have personally reviewed following labs and imaging studies  CBC: Recent Labs  Lab 12/19/17 1216 12/22/17 0726 12/23/17 0350 12/24/17 0433  WBC 7.9 6.4 6.9 6.2  NEUTROABS 4.9 5.0  --  3.2  HGB 14.8 10.1* 10.0* 10.3*  HCT 46.1* 32.1* 32.5* 31.9*  MCV 88.7 92.0 91.5 88.1  PLT 639* PLATELET CLUMPS NOTED ON SMEAR, UNABLE TO ESTIMATE 420* 262   Basic Metabolic Panel: Recent Labs  Lab 12/20/17 0641 12/21/17 0608 12/22/17 0726 12/22/17 0855 12/23/17 0350 12/24/17 0433  NA 139 141 142  --  138 139  K 3.8 4.1 3.0*  --  4.4 4.4  CL 104 108 110  --  107 107  CO2 27 25 23   --  23 23  GLUCOSE 162* 106* 157*  --  373* 106*  BUN 12 10 6   --  5* 5*  CREATININE 0.70 0.60 0.41*  --  0.63 0.47  CALCIUM 8.5* 8.3* 7.5*  --  8.3* 8.4*    MG  --   --   --  1.4* 1.9 1.6*  PHOS  --   --   --   --   --  4.0   GFR: Estimated Creatinine Clearance: 89.9 mL/min (by C-G formula based on SCr of 0.47 mg/dL). Liver Function Tests: Recent Labs  Lab 12/19/17 1216 12/20/17 0641 12/22/17 0726 12/24/17 0433  AST 19 19 18 18   ALT 16 11 12 10   ALKPHOS 97 56 61 63  BILITOT 1.0 0.5 0.5 0.4  PROT 9.2* 5.4* 6.4* 5.4*  ALBUMIN 5.3* 3.0* 3.5 2.9*   Recent Labs  Lab 12/19/17 1216 12/22/17 0726  LIPASE 16 18   No results for input(s): AMMONIA in the last 168 hours. Coagulation Profile: No results for input(s): INR, PROTIME in the last 168 hours. Cardiac Enzymes: No results for input(s): CKTOTAL, CKMB, CKMBINDEX, TROPONINI in the last 168 hours. BNP (last 3 results) No results for input(s): PROBNP in the last 8760 hours. HbA1C: No results for input(s): HGBA1C in the last 72 hours. CBG: Recent Labs  Lab 12/23/17 1959 12/24/17 0243 12/24/17 0444 12/24/17 0734 12/24/17 1153  GLUCAP 351* 135* 93 331* 163*   Lipid Profile: No results for input(s): CHOL, HDL, LDLCALC, TRIG, CHOLHDL, LDLDIRECT in the last 72 hours. Thyroid Function Tests: No results for input(s): TSH, T4TOTAL, FREET4, T3FREE, THYROIDAB in the last 72 hours. Anemia Panel: No results for input(s): VITAMINB12, FOLATE, FERRITIN, TIBC, IRON, RETICCTPCT in the last 72 hours. Sepsis Labs: No results for input(s): PROCALCITON, LATICACIDVEN in the last 168 hours.  Recent Results (from the past 240 hour(s))  Urine culture     Status: Abnormal   Collection Time: 12/16/17  8:30 AM  Result Value Ref Range Status   Specimen Description   Final    URINE, CLEAN CATCH Performed at [facility], [place] [address], [place], [place] [address]    Special Requests   Final    NONE Performed at [facility], [place] [address], [place], [place] [address]    Culture (A)  Final    10,000 COLONIES/mL DIPHTHEROIDS(CORYNEBACTERIUM SPECIES)    Report Status 12/17/2017 FINAL  Final    Radiology Studies: No results found. Scheduled Meds: . enoxaparin (LOVENOX) injection  40 mg Subcutaneous Q24H  . insulin pump   Subcutaneous Q4H  . metoCLOPramide  10 mg Oral TID WC  . pantoprazole (PROTONIX) IV  40 mg Intravenous Q24H   Continuous Infusions: . sodium chloride 125 mL/hr at 12/24/17 1116  . famotidine (PEPCID) IV 20 mg (12/24/17 1150)    LOS: 1 day   Kerney Elbe, DO [facility] PAGER is on AMION  If 7PM-7AM, please contact night-coverage [email or web address] Password  [email or web address] 12/24/2017, 4:28 PM

## 2017-12-24 NOTE — Progress Notes (Signed)
Inpatient Diabetes Program Recommendations  AACE/ADA: New Consensus Statement on Inpatient Glycemic Control (2015)  Target Ranges:  Prepandial:   less than 140 mg/dL      Peak postprandial:   less than 180 mg/dL (1-2 hours)      Critically ill patients:  140 - 180 mg/dL   Results for CLORA, OHMER (MRN [id number]) as of 12/24/2017 09:06  Ref. Range 12/23/2017 07:50 12/23/2017 12:21 12/23/2017 16:48 12/23/2017 17:12 12/23/2017 17:33 12/23/2017 19:59  Glucose-Capillary Latest Ref Range: 70 - 99 mg/dL 72 137 (H)  7 units NOVOLOG per Insulin Pump 40 (LL) 65 (L) 88 351 (H)  3 units NOVOLOG per Insulin Pump   Results for SHIRLE, PROVENCAL (MRN [id number]) as of 12/24/2017 09:06  Ref. Range 12/24/2017 02:43 12/24/2017 04:44 12/24/2017 07:34  Glucose-Capillary Latest Ref Range: 70 - 99 mg/dL 135 (H) 93 331 (H)  2.7 units NOVOLOG per Insulin Pump     Admit with: N&V  History: Type 1 DM, Gastroparesis  Home DM Meds: Insulin Pump  Current Orders: Insulin Pump Q4 hours     Just discharged from [facility] hospital on 12/03 after admission on 12/01 for the same issues.  Severe Hypoglycemia on admission and Hypoglycemic again yesterday at 4pm.    MD- Patient needs to follow up with her Endocrinologist Dr. Hartford Poli with [facility].  May need to further adjust Insulin Pump settings if she continues to have issues with Hypoglycemia at home.  Last saw Dr. Hartford Poli on 06/02/2017.     --Will follow patient during hospitalization--  Wyn Quaker RN, MSN, CDE Diabetes Coordinator Inpatient Glycemic Control Team Team Pager: [phone] (8a-5p)

## 2017-12-25 LAB — GLUCOSE, CAPILLARY
GLUCOSE-CAPILLARY: 119 mg/dL — AB (ref 70–99)
GLUCOSE-CAPILLARY: 62 mg/dL — AB (ref 70–99)
GLUCOSE-CAPILLARY: 124 mg/dL — AB (ref 70–99)
Glucose-Capillary: 123 mg/dL — ABNORMAL HIGH (ref 70–99)
Glucose-Capillary: 183 mg/dL — ABNORMAL HIGH (ref 70–99)
Glucose-Capillary: 29 mg/dL — CL (ref 70–99)
Glucose-Capillary: 42 mg/dL — CL (ref 70–99)
Glucose-Capillary: 42 mg/dL — CL (ref 70–99)
Glucose-Capillary: 55 mg/dL — ABNORMAL LOW (ref 70–99)
Glucose-Capillary: 71 mg/dL (ref 70–99)

## 2017-12-25 LAB — MAGNESIUM: Magnesium: 2.1 mg/dL (ref 1.7–2.4)

## 2017-12-25 LAB — PHOSPHORUS: Phosphorus: 3.8 mg/dL (ref 2.5–4.6)

## 2017-12-25 LAB — FOLATE: Folate: 12.9 ng/mL (ref >5.9)

## 2017-12-25 LAB — IRON AND TIBC
Iron: 92 ug/dL (ref 28–170)
Saturation Ratios: 24 % (ref 10.4–31.8)
TIBC: 379 ug/dL (ref 250–450)
UIBC: 287 ug/dL

## 2017-12-25 LAB — COMPREHENSIVE METABOLIC PANEL
ALT: 11 U/L (ref 0–44)
AST: 17 U/L (ref 15–41)
Albumin: 3.8 g/dL (ref 3.5–5.0)
Alkaline Phosphatase: 76 U/L (ref 38–126)
Anion gap: 9 (ref 5–15)
BUN: <5 mg/dL — ABNORMAL LOW (ref 6–20)
CO2: 25 mmol/L (ref 22–32)
Calcium: 9.2 mg/dL (ref 8.9–10.3)
Chloride: 108 mmol/L (ref 98–111)
Creatinine, Ser: 0.57 mg/dL (ref 0.44–1.00)
GFR calc Af Amer: >60 mL/min (ref >60)
GFR calc non Af Amer: >60 mL/min (ref >60)
Glucose, Bld: 81 mg/dL (ref 70–99)
Potassium: 3.6 mmol/L (ref 3.5–5.1)
Sodium: 142 mmol/L (ref 135–145)
Total Bilirubin: 0.8 mg/dL (ref 0.3–1.2)
Total Protein: 6.9 g/dL (ref 6.5–8.1)

## 2017-12-25 LAB — CBC WITH DIFFERENTIAL/PLATELET
Abs Immature Granulocytes: 0.03 10*3/uL (ref 0.00–0.07)
Basophils Absolute: 0 10*3/uL (ref 0.0–0.1)
Basophils Relative: 0 %
Eosinophils Absolute: 0.2 10*3/uL (ref 0.0–0.5)
Eosinophils Relative: 2 %
HCT: 40.1 % (ref 36.0–46.0)
Hemoglobin: 12.5 g/dL (ref 12.0–15.0)
Immature Granulocytes: 0 %
Lymphocytes Relative: 22 %
Lymphs Abs: 2.3 10*3/uL (ref 0.7–4.0)
MCH: 28.5 pg (ref 26.0–34.0)
MCHC: 31.2 g/dL (ref 30.0–36.0)
MCV: 91.6 fL (ref 80.0–100.0)
MONO ABS: 0.7 10*3/uL (ref 0.1–1.0)
Monocytes Relative: 6 %
Neutro Abs: 7.2 10*3/uL (ref 1.7–7.7)
Neutrophils Relative %: 70 %
Platelets: 512 10*3/uL — ABNORMAL HIGH (ref 150–400)
RBC: 4.38 MIL/uL (ref 3.87–5.11)
RDW: 14.4 % (ref 11.5–15.5)
WBC: 10.5 10*3/uL (ref 4.0–10.5)
nRBC: 0 % (ref 0.0–0.2)

## 2017-12-25 LAB — RETICULOCYTES
Immature Retic Fract: 9.8 % (ref 2.3–15.9)
RBC.: 4.38 MIL/uL (ref 3.87–5.11)
RETIC CT PCT: 2.1 % (ref 0.4–3.1)
Retic Count, Absolute: 91.5 10*3/uL (ref 19.0–186.0)

## 2017-12-25 LAB — FERRITIN: Ferritin: 23 ng/mL (ref 11–307)

## 2017-12-25 LAB — VITAMIN B12: Vitamin B-12: 288 pg/mL (ref 180–914)

## 2017-12-25 MED ORDER — PANTOPRAZOLE SODIUM 40 MG PO TBEC
40.0000 mg | DELAYED_RELEASE_TABLET | Freq: Every day | ORAL | Status: DC
  Administered 2017-12-25 – 2017-12-26 (×2): 40 mg via ORAL
  Filled 2017-12-25 (×3): qty 1

## 2017-12-25 MED ORDER — GLUCOSE 40 % PO GEL
ORAL | Status: AC
  Administered 2017-12-25: 37.5 g
  Filled 2017-12-25: qty 1

## 2017-12-25 MED ORDER — MORPHINE SULFATE (PF) 2 MG/ML IV SOLN
2.0000 mg | INTRAVENOUS | Status: AC | PRN
  Administered 2017-12-25 – 2017-12-26 (×2): 2 mg via INTRAVENOUS
  Filled 2017-12-25 (×2): qty 1

## 2017-12-25 MED ORDER — PROMETHAZINE HCL 25 MG PO TABS
25.0000 mg | ORAL_TABLET | Freq: Four times a day (QID) | ORAL | Status: DC | PRN
  Administered 2017-12-25: 25 mg via ORAL
  Filled 2017-12-25: qty 1

## 2017-12-25 MED ORDER — DEXTROSE 50 % IV SOLN
INTRAVENOUS | Status: AC
  Administered 2017-12-25: 50 mL
  Filled 2017-12-25: qty 50

## 2017-12-25 MED ORDER — FAMOTIDINE 20 MG PO TABS
20.0000 mg | ORAL_TABLET | Freq: Two times a day (BID) | ORAL | Status: DC
  Administered 2017-12-25 – 2017-12-26 (×2): 20 mg via ORAL
  Filled 2017-12-25 (×4): qty 1

## 2017-12-25 MED ORDER — GLUCOSE 4 G PO CHEW
CHEWABLE_TABLET | ORAL | Status: AC
  Administered 2017-12-25: 4
  Filled 2017-12-25: qty 1

## 2017-12-25 NOTE — Progress Notes (Addendum)
CRITICAL VALUE ALERT  Critical Value:  cbg 29   Date & Time Notied:  12/25/2017   Pt sitting on side of bed with no s/s of hypoglycemia.  Insulin pump on and pt states she has turned it off.    Pt states she did not herself a give bolus.    Provider Notified: Dr Alfredia Ferguson   Orders Received/Actions taken: orange juice and recheck in 15 minutes   12:10 PM cbg recheck 61.  Has eaten lunch.  Given more juice and will recheck in 15 minutes   cbg recheck 123.  Will continue to monitor.

## 2017-12-25 NOTE — Progress Notes (Signed)
PROGRESS NOTE    [name]  NLZ:[id number] DOB: October 11, 1990 DOA: 12/22/2017 PCP: Vicenta Aly, FNP   Brief Narrative:  HPI per Dr. Sander Radon on 12/22/17 [name] is a 27 y.o. female with medical history significant of diabetic gastroparesis, and type 1 diabetes mellitus, who presents with recurrent nausea, vomiting and abdominal pain.  Patient was discharged from the hospital 24 hours ago in a stable condition, treated for abdominal pain due to gastroparesis.  This morning around 2 AM she had recurrent symptoms, severe nausea and vomiting, multiple times, associated with abdominal pain, dull in nature, no improving or worsening factors.  Due to persistent symptoms she came back to the hospital.  At home patient on multiple antiemetics along with antiacids.  She has been able to continue insulin therapy per her insulin pump.  ED Course: Patient was found dehydrated, ill looking appearing, hypokalemic, she was referred for admission for evaluation.  *Hospitalization has been complicated by severe hypoglycemia with several episodes.  Patient has been self administering boluses and diabetes education coronary is been consulted.  If patient continues to have lows we will likely discontinue insulin pump and give her a hospital dictated sliding scale.  Yesterday morning the patient became acutely nauseous and started vomiting violently and started stating that her abdomen was hurting again.  Her diet was cut back to a full liquid diet and her medications were transitioned to IV again that she kept vomiting and had lost IV access.  She has improved today so diet was advanced and medications changed back to po but she had an asymptomatic low of 29 this AM so will continue to Monitor closely. She has a follow up with Dr. Hartford Poli of Endocrinology on Thursday.   Assessment & Plan:   Active Problems:   Intractable nausea and vomiting   Intractable vomiting with  nausea  Recurrent/refractive diabetic gastroparesis with associated Nausea and Vomiting -Acutely worsened this AM and because violently ill and nauseous and vomiting  -IVF increased to 100 mL/hr yesterday but changed back to 75 mL/hr -Continue symptomatic control with IV antiemetics with IV Promethazine 25 mg q6hprn N/V/Refractory N/V.  -C/w po Ondansetron 4 mg q8hprn Nausea/Vomiting  -C/w Metoclopramide 10 mg po TID -C/w Scopolamine 1.5 TD Patch  -IV analgesics with Morphine now D/C'd -IV Famotidine changed to po 20 mg po BID again -Restarted po Pantoprazole 40 mg po Daily and changed from IV Pantoprazole  -C/w Dicyclomine 10 mg po TIDprn Spasms -Now on Soft Diet -Was planning on D/Cing the patient today as she did well but became Acutely ill again -Continue to Monitor Extremely carefully and may discuss with Gastroenterology   Type 1 Diabetes Mellitus complicated by significant Hypoglycemia  -Continue insulin infusion per insulin pump,  -Added sliding scale for glucose coverage and monitoring but patient self bolusing and had a low yesterday.   -Diabetes Education consulted for further evaluation and recommendation -Diet has been Advanced as tolerated to Soft Diet. -There is no clinical signs of ketoacidosis. -CBG's ranging from  29-183; Had a high of 183 today -Had Several Lows of 33, 42, 55, and 29 -? If Patient is self bolusing to make herself hypoglycemic to feel bad and Nauseous and vomit so that her medications be administered IV especially IV Morphine or if she is not pausing the Insulin Pump for long enough -She is to get all CBGs with hospital meter and document all insulin boluses given by the patient per pump;  -Will need to monitor extremely carefully  about self boluses -Target CBG's are supposed to be between 100-150 -Diabetes Education Coordinator to Educate further -Continue to Monitor CBG's very carefully -Basal Rate is 1.8 units/hr all day and may need to be adjusted  by Dr. Hartford Poli -Diet Advanced to Soft Diet today -If she continues to have significant lows and not adhere to Diabetes Education Coordinator will discontinue her insulin pump and give her via hospital protocol but will hold off for now -She is to follow up with Dr. Hartford Poli of Endocrinology as an outpatient and has an appointment on Thursday  Hypokalemia  -K+ was improved and now 3.6 -Continue to Monitor and Replete as Necessary  -Repeat CMP and in AM   Hypomagnesemia -Mag Level this AM was 2.1 -Continue to Monitor and Replete as Necessary  -Repeat Mag Level in AM   Asthma -No signs of exacerbation  -C/w Albuterol 2.5 mg IH q6hprn Wheezing and SOB  Normocytic Anemia -Patient's hemoglobin/hematocrit went from 10.1/32.1 is now 12.5/40.1 -Continue to monitor for signs and symptoms of bleeding -Checked Anemia Panel and showed iron level of 92, U IBC of 287, TIBC 379, saturation ratios of 24%, ferritin level 23, folate level 12.9, and  vitamin D B12 level 288 -Repeat CBC in the a.m.  DVT prophylaxis: Enoxaparin 40 mg sq q24h Code Status: FULL CODE Family Communication: No family present at bedside Disposition Plan: Anticipate D/C tomorrow if she does not have significant hypoglycemia   Consultants:   None  Procedures:  None   Antimicrobials:  Anti-infectives (From admission, onward)   None     Subjective: Seen and examined at bedside states that she is doing well today and was wanting to go home this afternoon.  No chest pain, lightheadedness or dizziness.  She had a episode of hypoglycemia at 1130 this morning when she dropped down to 29.  She recovered and went to 123 and was going to be discharged afterwards.  No other concerns or complaints at this time  Objective: Vitals:   12/24/17 2027 12/24/17 2045 12/25/17 0400 12/25/17 1310  BP: (!) 147/103 (!) 152/99 140/90 (!) 116/93  Pulse: (!) 126  100 (!) 107  Resp: 14  12 16   Temp: 99.4 F (37.4 C)  98.2 F (36.8 C) 98.5 F  (36.9 C)  TempSrc: Oral  Oral Oral  SpO2: 100%  99% 100%  Weight:      Height:        Intake/Output Summary (Last 24 hours) at 12/25/2017 1951 Last data filed at 12/25/2017 1500 Gross per 24 hour  Intake 2580 ml  Output -  Net 2580 ml   Filed Weights   12/23/17 0103  Weight: 54.4 kg   Examination: Physical Exam:  Constitutional: Well-nourished, well-developed thin African-American female currently no acute distress and feels improved from yesterday ENMT: External ears nose appear normal.  Grossly normal hearing.  Mucous members are moister today Neck: Appears supple no JVD Respiratory: Mildly diminished to auscultation bilaterally with no appreciable wheezing, rales, rhonchi.  Unlabored breathing is not wearing any supplemental oxygen via nasal cannula Cardiovascular: Slightly tachycardic rate but regular rhythm.  No appreciable lower extremity edema Abdomen: Soft, nontender to palpate.  Nondistended.  Bowel sounds present all 4 quadrants and has insulin pump GU: Deferred Musculoskeletal: No contractures or cyanosis Skin: No appreciable rashes or lesions on limited skin evaluation Neurologic: Cranial nerves II through XII grossly intact no appreciable focal deficits Psychiatric: Normal judgment and insight.  Patient is awake, alert and oriented.  Anxious to go  home  Data Reviewed: I have personally reviewed following labs and imaging studies  CBC: Recent Labs  Lab 12/19/17 1216 12/22/17 0726 12/23/17 0350 12/24/17 0433 12/25/17 0523  WBC 7.9 6.4 6.9 6.2 10.5  NEUTROABS 4.9 5.0  --  3.2 7.2  HGB 14.8 10.1* 10.0* 10.3* 12.5  HCT 46.1* 32.1* 32.5* 31.9* 40.1  MCV 88.7 92.0 91.5 88.1 91.6  PLT 639* PLATELET CLUMPS NOTED ON SMEAR, UNABLE TO ESTIMATE 420* 377 030*   Basic Metabolic Panel: Recent Labs  Lab 12/21/17 0608 12/22/17 0726 12/22/17 0855 12/23/17 0350 12/24/17 0433 12/25/17 0523  NA 141 142  --  138 139 142  K 4.1 3.0*  --  4.4 4.4 3.6  CL 108 110  --   107 107 108  CO2 25 23  --  23 23 25   GLUCOSE 106* 157*  --  373* 106* 81  BUN 10 6  --  5* 5* <5*  CREATININE 0.60 0.41*  --  0.63 0.47 0.57  CALCIUM 8.3* 7.5*  --  8.3* 8.4* 9.2  MG  --   --  1.4* 1.9 1.6* 2.1  PHOS  --   --   --   --  4.0 3.8   GFR: Estimated Creatinine Clearance: 89.9 mL/min (by C-G formula based on SCr of 0.57 mg/dL). Liver Function Tests: Recent Labs  Lab 12/19/17 1216 12/20/17 0641 12/22/17 0726 12/24/17 0433 12/25/17 0523  AST 19 19 18 18 17   ALT 16 11 12 10 11   ALKPHOS 97 56 61 63 76  BILITOT 1.0 0.5 0.5 0.4 0.8  PROT 9.2* 5.4* 6.4* 5.4* 6.9  ALBUMIN 5.3* 3.0* 3.5 2.9* 3.8   Recent Labs  Lab 12/19/17 1216 12/22/17 0726  LIPASE 16 18   No results for input(s): AMMONIA in the last 168 hours. Coagulation Profile: No results for input(s): INR, PROTIME in the last 168 hours. Cardiac Enzymes: No results for input(s): CKTOTAL, CKMB, CKMBINDEX, TROPONINI in the last 168 hours. BNP (last 3 results) No results for input(s): PROBNP in the last 8760 hours. HbA1C: No results for input(s): HGBA1C in the last 72 hours. CBG: Recent Labs  Lab 12/25/17 0738 12/25/17 1133 12/25/17 1205 12/25/17 1307 12/25/17 1750  GLUCAP 71 29* 62* 123* 183*   Lipid Profile: No results for input(s): CHOL, HDL, LDLCALC, TRIG, CHOLHDL, LDLDIRECT in the last 72 hours. Thyroid Function Tests: No results for input(s): TSH, T4TOTAL, FREET4, T3FREE, THYROIDAB in the last 72 hours. Anemia Panel: Recent Labs    12/25/17 0523  VITAMINB12 288  FOLATE 12.9  FERRITIN 23  TIBC 379  IRON 92  RETICCTPCT 2.1   Sepsis Labs: No results for input(s): PROCALCITON, LATICACIDVEN in the last 168 hours.  Recent Results (from the past 240 hour(s))  Urine culture     Status: Abnormal   Collection Time: 12/16/17  8:30 AM  Result Value Ref Range Status   Specimen Description   Final    URINE, CLEAN CATCH Performed at [facility], [place] [address]  [place], [place] [address]    Special Requests   Final    NONE Performed at [facility], [place] [address] [place], [place] [address]    Culture (A)  Final    10,000 COLONIES/mL DIPHTHEROIDS(CORYNEBACTERIUM SPECIES)   Report Status 12/17/2017 FINAL  Final    Radiology Studies: No results found. Scheduled Meds: . enoxaparin (LOVENOX) injection  40 mg Subcutaneous Q24H  . famotidine  20 mg Oral BID  . insulin  pump   Subcutaneous Q4H  . metoCLOPramide  10 mg Oral TID WC  . pantoprazole  40 mg Oral Daily   Continuous Infusions: . sodium chloride 125 mL/hr (12/25/17 0839)    LOS: 2 days   Kerney Elbe, DO [facility] PAGER is on [place]  If 7PM-7AM, please contact night-coverage [email or web address] Password [facility] 12/25/2017, 7:51 PM

## 2017-12-26 LAB — GLUCOSE, CAPILLARY
GLUCOSE-CAPILLARY: 91 mg/dL (ref 70–99)
Glucose-Capillary: 21 mg/dL — CL (ref 70–99)
Glucose-Capillary: 92 mg/dL (ref 70–99)
Glucose-Capillary: 168 mg/dL — ABNORMAL HIGH (ref 70–99)
Glucose-Capillary: 168 mg/dL — ABNORMAL HIGH (ref 70–99)
Glucose-Capillary: 173 mg/dL — ABNORMAL HIGH (ref 70–99)

## 2017-12-26 LAB — COMPREHENSIVE METABOLIC PANEL
ALT: 11 U/L (ref 0–44)
AST: 22 U/L (ref 15–41)
Albumin: 3.3 g/dL — ABNORMAL LOW (ref 3.5–5.0)
Alkaline Phosphatase: 66 U/L (ref 38–126)
Anion gap: 10 (ref 5–15)
BUN: 7 mg/dL (ref 6–20)
CO2: 20 mmol/L — ABNORMAL LOW (ref 22–32)
Calcium: 9.2 mg/dL (ref 8.9–10.3)
Chloride: 113 mmol/L — ABNORMAL HIGH (ref 98–111)
Creatinine, Ser: 0.69 mg/dL (ref 0.44–1.00)
GFR calc Af Amer: >60 mL/min (ref >60)
GFR calc non Af Amer: >60 mL/min (ref >60)
Glucose, Bld: 57 mg/dL — ABNORMAL LOW (ref 70–99)
POTASSIUM: 5.7 mmol/L — AB (ref 3.5–5.1)
Sodium: 143 mmol/L (ref 135–145)
Total Bilirubin: 1.1 mg/dL (ref 0.3–1.2)
Total Protein: 6.2 g/dL — ABNORMAL LOW (ref 6.5–8.1)

## 2017-12-26 LAB — CBC WITH DIFFERENTIAL/PLATELET
Abs Immature Granulocytes: 0.03 10*3/uL (ref 0.00–0.07)
Basophils Absolute: 0 10*3/uL (ref 0.0–0.1)
Basophils Relative: 0 %
EOS PCT: 4 %
Eosinophils Absolute: 0.3 10*3/uL (ref 0.0–0.5)
HCT: 37.9 % (ref 36.0–46.0)
Hemoglobin: 12.1 g/dL (ref 12.0–15.0)
Immature Granulocytes: 0 %
Lymphocytes Relative: 32 %
Lymphs Abs: 2.2 10*3/uL (ref 0.7–4.0)
MCH: 28.4 pg (ref 26.0–34.0)
MCHC: 31.9 g/dL (ref 30.0–36.0)
MCV: 89 fL (ref 80.0–100.0)
Monocytes Absolute: 0.7 10*3/uL (ref 0.1–1.0)
Monocytes Relative: 10 %
Neutro Abs: 3.7 10*3/uL (ref 1.7–7.7)
Neutrophils Relative %: 54 %
PLATELETS: 451 10*3/uL — AB (ref 150–400)
RBC: 4.26 MIL/uL (ref 3.87–5.11)
RDW: 14.3 % (ref 11.5–15.5)
WBC: 6.9 10*3/uL (ref 4.0–10.5)
nRBC: 0 % (ref 0.0–0.2)

## 2017-12-26 LAB — MAGNESIUM: MAGNESIUM: 1.6 mg/dL — AB (ref 1.7–2.4)

## 2017-12-26 LAB — PHOSPHORUS: Phosphorus: 4.3 mg/dL (ref 2.5–4.6)

## 2017-12-26 MED ORDER — METOCLOPRAMIDE HCL 5 MG/ML IJ SOLN
10.0000 mg | Freq: Once | INTRAMUSCULAR | Status: AC
  Administered 2017-12-26: 10 mg via INTRAVENOUS
  Filled 2017-12-26: qty 2

## 2017-12-26 MED ORDER — DEXTROSE 50 % IV SOLN
INTRAVENOUS | Status: AC
  Administered 2017-12-26: 05:00:00
  Filled 2017-12-26: qty 50

## 2017-12-26 MED ORDER — FAMOTIDINE IN NACL 20-0.9 MG/50ML-% IV SOLN
20.0000 mg | Freq: Two times a day (BID) | INTRAVENOUS | Status: DC
  Administered 2017-12-26: 20 mg via INTRAVENOUS
  Filled 2017-12-26 (×2): qty 50

## 2017-12-26 MED ORDER — PANTOPRAZOLE SODIUM 40 MG IV SOLR
40.0000 mg | INTRAVENOUS | Status: DC
  Administered 2017-12-26: 40 mg via INTRAVENOUS
  Filled 2017-12-26: qty 40

## 2017-12-26 MED ORDER — SODIUM CHLORIDE 0.9 % IV BOLUS
1000.0000 mL | Freq: Once | INTRAVENOUS | Status: AC
  Administered 2017-12-26: 1000 mL via INTRAVENOUS

## 2017-12-26 MED ORDER — PROMETHAZINE HCL 25 MG/ML IJ SOLN
12.5000 mg | Freq: Once | INTRAMUSCULAR | Status: AC
  Administered 2017-12-26: 12.5 mg via INTRAVENOUS
  Filled 2017-12-26: qty 1

## 2017-12-26 MED ORDER — ERYTHROMYCIN ETHYLSUCCINATE 200 MG/5ML PO SUSR
200.0000 mg | Freq: Three times a day (TID) | ORAL | Status: DC
  Administered 2017-12-26 – 2017-12-27 (×3): 200 mg via ORAL
  Filled 2017-12-26 (×5): qty 5

## 2017-12-26 MED ORDER — MORPHINE SULFATE (PF) 2 MG/ML IV SOLN
2.0000 mg | INTRAVENOUS | Status: DC | PRN
  Administered 2017-12-26 – 2017-12-27 (×4): 2 mg via INTRAVENOUS
  Filled 2017-12-26 (×4): qty 1

## 2017-12-26 MED ORDER — HYDRALAZINE HCL 20 MG/ML IJ SOLN: 10.0000 mg | Freq: Four times a day (QID) | INTRAMUSCULAR | Status: DC | PRN

## 2017-12-26 MED ORDER — PROMETHAZINE HCL 25 MG/ML IJ SOLN: 25.0000 mg | Freq: Four times a day (QID) | INTRAMUSCULAR | Status: DC | PRN

## 2017-12-26 MED ORDER — MORPHINE SULFATE (PF) 2 MG/ML IV SOLN
2.0000 mg | INTRAVENOUS | Status: DC | PRN
  Administered 2017-12-26: 2 mg via INTRAVENOUS
  Filled 2017-12-26: qty 1

## 2017-12-26 MED ORDER — FAMOTIDINE 20 MG PO TABS: 20.0000 mg | ORAL_TABLET | Freq: Two times a day (BID) | ORAL | 0 refills | Status: DC

## 2017-12-26 NOTE — Consult Note (Signed)
[facility] CONSULT Reason for consult: Gastroparesis nausea and vomiting Referring Physician: [facility] hospitalist.  PCP: Vicenta Aly, FNP.  Primary gastroenterologist: Dr. Wonda Horner  [name] is an 27 y.o. female.  HPI: She has had a long history of type 1 diabetes that started about age 33.  She has had multiple admissions.  She is seen multiple doctors and most recently has seen Dr. Paulita Fujita who performed EGD 9/16 that was normal.  She has had empiric cholecystectomy.  She has frequent episodes of nausea and vomiting and at times is controllable.  She has documented gastroparesis withLast gastric emptying scan 2018 showed fairly severe gastroparesis with only 16% emptying at 2 hours only 24% emptying at 4 hours.  Patient's been on a multitude of medicines she is been in the hospital a multitude of times.  She has been admitted now and we are asked to see her because none of the medicine seem to be working.  She is had a history of marijuana use and states that she has not used it now in several months sometimes it would help her vomiting and other times it would not.  She also has been diagnosed with cyclic vomiting syndrome.  Here in the hospital she has been on metoclopramide regularly as well as PPI without much improvement.  She is been on a multitude of medicines in the past and is unable to name the mall or tell me if any of them helped her.  Past Medical History:  Diagnosis Date  . Anxiety   . Arthritis    "hands, feet, knees" (12/18/2016)  . Asthma   . Diabetic gastroparesis ([place])    Per gastric emptying study 07/09/16 which showed significant delayed gastric emptying.  . Gallstones   . Gastroparesis   . GERD (gastroesophageal reflux disease)   . Heart murmur   . Hepatic steatosis 11/26/2014   and hepatomegaly  . Hypertension    hx (12/18/2016)  . Intractable cyclical vomiting syndrome    Archie Endo 12/18/2016  . Liver mass 11/26/2014  . Pancreatitis, acute  11/26/2014  . Pneumonia    "as a teen X 1" (12/18/2016)  . Type I diabetes mellitus ([place]) 2007   IDDM.  poorly controlled, multiple admits with DKA    Past Surgical History:  Procedure Laterality Date  . CHOLECYSTECTOMY N/A 02/11/2015   Procedure: LAPAROSCOPIC CHOLECYSTECTOMY WITH INTRAOPERATIVE CHOLANGIOGRAM;  Surgeon: Greer Pickerel, MD;  Location: [facility];  Service: General;  Laterality: N/A;  . ESOPHAGOGASTRODUODENOSCOPY (EGD) WITH PROPOFOL Left 09/20/2014   Procedure: ESOPHAGOGASTRODUODENOSCOPY (EGD) WITH PROPOFOL;  Surgeon: Arta Silence, MD;  Location[facility] ENDOSCOPY;  Service: Endoscopy;  Laterality: Left;  . WISDOM TOOTH EXTRACTION      Family History  Problem Relation Age of Onset  . Heart disease Maternal Grandmother   . Heart disease Maternal Grandfather   . Diabetes Mother   . Hyperlipidemia Mother   . Hypertension Father   . Heart disease Father   . Hypertension Paternal Grandmother   . Cancer Paternal Grandfather     Social History:  reports that she has never smoked. She has never used smokeless tobacco. She reports that she has current or past drug history. Drug: Marijuana. She reports that she does not drink alcohol.  Allergies:  Allergies  Allergen Reactions  . Peanut-Containing Drug Products Swelling and Other (See Comments)    Reaction:  Swelling of mouth and lips   . 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  Reaction:  Swelling of mouth and lips   . Ultram [Tramadol] Itching    Medications; Prior to Admission medications   Medication Sig Start Date End Date Taking? Authorizing Provider  albuterol (PROVENTIL HFA;VENTOLIN HFA) 108 (90 Base) MCG/ACT inhaler Inhale 1-2 puffs into the lungs every 6 (six) hours as needed for wheezing or shortness of breath.   Yes [provider]  dicyclomine (BENTYL) 10 MG capsule Take 1 capsule (10 mg total) by mouth 3 (three) times daily as needed for spasms. 12/07/17  Yes Eugenie Filler, MD  glucagon (GLUCAGON EMERGENCY) 1 MG injection Inject 1 mg into the muscle once as needed (For very low blood sugars. May repeat in 15-20 minutes if needed.). 12/20/16  Yes Hongalgi, Lenis Dickinson, MD  insulin aspart (NOVOLOG FLEXPEN) 100 UNIT/ML FlexPen Use with mealtime insulin based on 1:15 carb count as well as correctional insulin per sliding scale (see below) Blood sugar <120 = 0 units Blood sugar 121-150 = 1 unit Blood sugar 150-200 = 2 units Blood sugar 201-250 = 3 units Blood sugar 251-300 = 5 units Blood sugar 301-350= 7 units Blood sugar 351 - 400 = 9 units Over 400 call your doctor Patient taking differently: Inject 0-9 Units into the skin 3 (three) times daily with meals. Via insulin pump  Use with mealtime insulin based on 1:15 carb count as well as correctional insulin per sliding scale (see below) Blood sugar <120 = 0 units Blood sugar 121-150 = 1 unit Blood sugar 150-200 = 2 units Blood sugar 201-250 = 3 units Blood sugar 251-300 = 5 units Blood sugar 301-350= 7 units Blood sugar 351 - 400 = 9 units Over 400 call your doctor 12/11/17  Yes Elodia Florence., MD  metoCLOPramide (REGLAN) 10 MG tablet Take 1 tablet (10 mg total) by mouth 3 (three) times daily with meals. Patient taking differently: Take 10 mg by mouth 3 (three) times daily as needed for nausea or vomiting.  11/07/17 11/07/18 Yes Debbe Odea, MD  ondansetron (ZOFRAN ODT) 4 MG disintegrating tablet Take 1 tablet (4 mg total) by mouth every 8 (eight) hours as needed for nausea or vomiting. 11/07/17  Yes Debbe Odea, MD  pantoprazole (PROTONIX) 40 MG tablet Take 1 tablet (40 mg total) by mouth daily. 12/21/17 01/20/18 Yes Mikhail, Velta Addison, DO  promethazine (PHENERGAN) 25 MG suppository Place 1 suppository (25 mg total) rectally every 6 (six) hours as needed for nausea or vomiting. 12/14/17  Yes Etta Quill, NP  scopolamine (TRANSDERM-SCOP) 1 MG/3DAYS Place 1 patch (1.5 mg total) onto the skin every 3  (three) days as needed (during episodes of vomiting). 11/21/17  Yes Debbe Odea, MD  blood glucose meter kit and supplies Dispense based on patient and insurance preference. Use up to four times daily as directed. (FOR ICD-10 E10.9, E11.9). 12/07/17   Eugenie Filler, MD  famotidine (PEPCID) 20 MG tablet Take 1 tablet (20 mg total) by mouth 2 (two) times daily. 12/26/17   Raiford Noble Latif, DO  Insulin Glargine (LANTUS) 100 UNIT/ML Solostar Pen Inject 25 Units into the skin daily. Patient not taking: Reported on 12/19/2017 12/11/17   Elodia Florence., MD  Insulin Pen Needle 31G X 5 MM MISC 30 Units by Does not apply route at bedtime. 12/07/17   Eugenie Filler, MD   . enoxaparin (LOVENOX) injection  40 mg Subcutaneous Q24H  . insulin pump   Subcutaneous Q4H  . metoCLOPramide  10 mg Oral TID WC  .  pantoprazole (PROTONIX) IV  40 mg Intravenous Q24H   PRN Meds acetaminophen **OR** acetaminophen, albuterol, dicyclomine, morphine injection, ondansetron (ZOFRAN) IV, oxyCODONE, promethazine, promethazine, scopolamine Results for orders placed or performed during the hospital encounter of 12/22/17 (from the past 48 hour(s))  Glucose, capillary     Status: None   Collection Time: 12/24/17  4:56 PM  Result Value Ref Range   Glucose-Capillary 85 70 - 99 mg/dL  Glucose, capillary     Status: Abnormal   Collection Time: 12/24/17  8:27 PM  Result Value Ref Range   Glucose-Capillary 48 (L) 70 - 99 mg/dL  Glucose, capillary     Status: Abnormal   Collection Time: 12/24/17  9:01 PM  Result Value Ref Range   Glucose-Capillary 148 (H) 70 - 99 mg/dL  Glucose, capillary     Status: Abnormal   Collection Time: 12/25/17 12:08 AM  Result Value Ref Range   Glucose-Capillary 55 (L) 70 - 99 mg/dL  Glucose, capillary     Status: Abnormal   Collection Time: 12/25/17  1:47 AM  Result Value Ref Range   Glucose-Capillary 42 (LL) 70 - 99 mg/dL   Comment 1 Notify RN   Glucose, capillary     Status:  Abnormal   Collection Time: 12/25/17  2:18 AM  Result Value Ref Range   Glucose-Capillary 33 (LL) 70 - 99 mg/dL   Comment 1 Notify RN   Glucose, capillary     Status: Abnormal   Collection Time: 12/25/17  2:21 AM  Result Value Ref Range   Glucose-Capillary 42 (LL) 70 - 99 mg/dL   Comment 1 Notify RN   Glucose, capillary     Status: Abnormal   Collection Time: 12/25/17  3:35 AM  Result Value Ref Range   Glucose-Capillary 119 (H) 70 - 99 mg/dL  CBC with Differential/Platelet     Status: Abnormal   Collection Time: 12/25/17  5:23 AM  Result Value Ref Range   WBC 10.5 4.0 - 10.5 K/uL   RBC 4.38 3.87 - 5.11 MIL/uL   Hemoglobin 12.5 12.0 - 15.0 g/dL   HCT 40.1 36.0 - 46.0 %   MCV 91.6 80.0 - 100.0 fL   MCH 28.5 26.0 - 34.0 pg   MCHC 31.2 30.0 - 36.0 g/dL   RDW 14.4 11.5 - 15.5 %   Platelets 512 (H) 150 - 400 K/uL   nRBC 0.0 0.0 - 0.2 %   Neutrophils Relative % 70 %   Neutro Abs 7.2 1.7 - 7.7 K/uL   Lymphocytes Relative 22 %   Lymphs Abs 2.3 0.7 - 4.0 K/uL   Monocytes Relative 6 %   Monocytes Absolute 0.7 0.1 - 1.0 K/uL   Eosinophils Relative 2 %   Eosinophils Absolute 0.2 0.0 - 0.5 K/uL   Basophils Relative 0 %   Basophils Absolute 0.0 0.0 - 0.1 K/uL   Immature Granulocytes 0 %   Abs Immature Granulocytes 0.03 0.00 - 0.07 K/uL    Comment: Performed at [facility], [place] [address], [place], [place] [address]  Comprehensive metabolic panel     Status: Abnormal   Collection Time: 12/25/17  5:23 AM  Result Value Ref Range   Sodium 142 135 - 145 mmol/L   Potassium 3.6 3.5 - 5.1 mmol/L    Comment: DELTA CHECK NOTED   [place] 108 98 - 111 mmol/L   CO2 25 22 - 32 mmol/L   Glucose, Bld 81 70 - 99 mg/dL   BUN <5 (L)  6 - 20 mg/dL   Creatinine, Ser 0.57 0.44 - 1.00 mg/dL   Calcium 9.2 8.9 - 10.3 mg/dL   Total Protein 6.9 6.5 - 8.1 g/dL   Albumin 3.8 3.5 - 5.0 g/dL   AST 17 15 - 41 U/L   ALT 11 0 - 44 U/L   Alkaline Phosphatase 76 38 - 126 U/L   Total  Bilirubin 0.8 0.3 - 1.2 mg/dL   GFR calc non Af Amer >60 >60 mL/min   GFR calc Af Amer >60 >60 mL/min   Anion gap 9 5 - 15    Comment: Performed at [facility], [place] [address], [place], [place] [address]  Magnesium     Status: None   Collection Time: 12/25/17  5:23 AM  Result Value Ref Range   Magnesium 2.1 1.7 - 2.4 mg/dL    Comment: Performed at [facility], [place] [address], [place], [place] [address]  Phosphorus     Status: None   Collection Time: 12/25/17  5:23 AM  Result Value Ref Range   Phosphorus 3.8 2.5 - 4.6 mg/dL    Comment: Performed at [facility], [place] [address] [place], [place] [address]  Vitamin B12     Status: None   Collection Time: 12/25/17  5:23 AM  Result Value Ref Range   Vitamin B-12 288 180 - 914 pg/mL    Comment: (NOTE) This assay is not validated for testing neonatal or myeloproliferative syndrome specimens for Vitamin B12 levels. Performed at [facility], [place] [address] [place], [place] [address]   Folate     Status: None   Collection Time: 12/25/17  5:23 AM  Result Value Ref Range   Folate 12.9 >5.9 ng/mL    Comment: Performed at [facility], [place] [address], [place], [place] [address]  Iron and TIBC     Status: None   Collection Time: 12/25/17  5:23 AM  Result Value Ref Range   Iron 92 28 - 170 ug/dL   TIBC 379 250 - 450 ug/dL   Saturation Ratios 24 10.4 - 31.8 %   UIBC 287 ug/dL    Comment: Performed at [facility], [place] [address] [place], [place] [address]  Ferritin     Status: None   Collection Time: 12/25/17  5:23 AM  Result Value Ref Range   Ferritin 23 11 - 307 ng/mL    Comment: Performed at [facility], [place] [address], [place], [place] [address]  Reticulocytes     Status: None   Collection Time: 12/25/17  5:23 AM  Result Value Ref Range   Retic Ct Pct 2.1 0.4 - 3.1 %   RBC. 4.38 3.87 - 5.11  MIL/uL   Retic Count, Absolute 91.5 19.0 - 186.0 K/uL   Immature Retic Fract 9.8 2.3 - 15.9 %    Comment: Performed at [facility], [place] [address] [place], [place] [address]  Glucose, capillary     Status: None   Collection Time: 12/25/17  7:38 AM  Result Value Ref Range   Glucose-Capillary 71 70 - 99 mg/dL   Comment 1 Notify RN    Comment 2 Document in Chart   Glucose, capillary     Status: Abnormal   Collection Time: 12/25/17 11:33 AM  Result Value Ref Range   Glucose-Capillary 29 (LL) 70 - 99 mg/dL   Comment 1 Notify RN    Comment 2 Document in  Chart   Glucose, capillary     Status: Abnormal   Collection Time: 12/25/17 12:05 PM  Result Value Ref Range   Glucose-Capillary 62 (L) 70 - 99 mg/dL   Comment 1 Notify RN    Comment 2 Document in Chart   Glucose, capillary     Status: Abnormal   Collection Time: 12/25/17  1:07 PM  Result Value Ref Range   Glucose-Capillary 123 (H) 70 - 99 mg/dL   Comment 1 Notify RN    Comment 2 Document in Chart   Glucose, capillary     Status: Abnormal   Collection Time: 12/25/17  5:50 PM  Result Value Ref Range   Glucose-Capillary 183 (H) 70 - 99 mg/dL   Comment 1 Notify RN    Comment 2 Document in Chart   Glucose, capillary     Status: Abnormal   Collection Time: 12/25/17  9:15 PM  Result Value Ref Range   Glucose-Capillary 124 (H) 70 - 99 mg/dL  Glucose, capillary     Status: Abnormal   Collection Time: 12/26/17  5:19 AM  Result Value Ref Range   Glucose-Capillary 21 (LL) 70 - 99 mg/dL   Comment 1 Notify RN   CBC with Differential/Platelet     Status: Abnormal   Collection Time: 12/26/17  5:44 AM  Result Value Ref Range   WBC 6.9 4.0 - 10.5 K/uL   RBC 4.26 3.87 - 5.11 MIL/uL   Hemoglobin 12.1 12.0 - 15.0 g/dL   HCT 37.9 36.0 - 46.0 %   MCV 89.0 80.0 - 100.0 fL   MCH 28.4 26.0 - 34.0 pg   MCHC 31.9 30.0 - 36.0 g/dL   RDW 14.3 11.5 - 15.5 %   Platelets 451 (H) 150 - 400 K/uL   nRBC 0.0 0.0 - 0.2 %    Neutrophils Relative % 54 %   Neutro Abs 3.7 1.7 - 7.7 K/uL   Lymphocytes Relative 32 %   Lymphs Abs 2.2 0.7 - 4.0 K/uL   Monocytes Relative 10 %   Monocytes Absolute 0.7 0.1 - 1.0 K/uL   Eosinophils Relative 4 %   Eosinophils Absolute 0.3 0.0 - 0.5 K/uL   Basophils Relative 0 %   Basophils Absolute 0.0 0.0 - 0.1 K/uL   Immature Granulocytes 0 %   Abs Immature Granulocytes 0.03 0.00 - 0.07 K/uL    Comment: Performed at [facility], [place] [address] [place], [place] [address]  Glucose, capillary     Status: None   Collection Time: 12/26/17  5:56 AM  Result Value Ref Range   Glucose-Capillary 92 70 - 99 mg/dL  Glucose, capillary     Status: Abnormal   Collection Time: 12/26/17  7:30 AM  Result Value Ref Range   Glucose-Capillary 168 (H) 70 - 99 mg/dL   Comment 1 Notify RN    Comment 2 Document in Chart   Glucose, capillary     Status: Abnormal   Collection Time: 12/26/17 12:14 PM  Result Value Ref Range   Glucose-Capillary 168 (H) 70 - 99 mg/dL   Comment 1 Notify RN    Comment 2 Document in Chart     No results found.             Blood pressure (!) 155/108, pulse (!) 115, temperature 98.8 F (37.1 C), temperature source Oral, resp. rate 16, height 5' 4"  (1.626 m), weight 54.4 kg, last menstrual period 12/06/2017, SpO2 100 %.  Physical exam:   General--thin  anxious African-American female in no distress ENT--nonicteric Neck--no lymphadenopathy  heart--regular rate and rhythm without murmurs or gallops Lungs--clear Abdomen--nontender Psych--alert and oriented but does seem somewhat nervous.  Assessment: 1.  Gastroparesis.  This is been well documented and appears to be quite severe.  She seems to be tolerating the current diet poorly despite the use of metoclopramide.  She may benefit from the addition of erythromycin. 2.  Type 1 diabetes.  Uses insulin pump  Plan: We will go ahead and see if we can order 6 small meals a day.  We will give  her erythromycin 250 mg orally 3 times daily prior to meals and see how she does.  If this regimen helps she can follow-up with Dr. Paulita Fujita and have her medications adjusted as an outpatient.   Nancy Fetter 12/26/2017, 3:57 PM   This note was created using voice recognition software and minor errors may Have occurred unintentionally. Pager: [phone] If no answer or after hours call [phone]

## 2017-12-26 NOTE — Progress Notes (Signed)
Inpatient Diabetes Program Recommendations  AACE/ADA: New Consensus Statement on Inpatient Glycemic Control (2019)  Target Ranges:  Prepandial:   less than 140 mg/dL      Peak postprandial:   less than 180 mg/dL (1-2 hours)      Critically ill patients:  140 - 180 mg/dL   Results for MACIEL, KEGG (MRN [id number]) as of 12/26/2017 09:53  Ref. Range 12/25/2017 07:38 12/25/2017 11:33 12/25/2017 12:05 12/25/2017 13:07 12/25/2017 17:50 12/25/2017 21:15 12/26/2017 05:19 12/26/2017 05:56 12/26/2017 07:30  Glucose-Capillary Latest Ref Range: 70 - 99 mg/dL 71 29 (LL) 62 (L) 123 (H) 183 (H) 124 (H) 21 (LL) 92 168 (H)   Review of Glycemic Control  Diabetes history: DM1 Outpatient Diabetes medications: Medtronic insulin pump Current orders for Inpatient glycemic control: Insulin Pump  Insulin Pump: Per Dr. Hartford Poli, recommend patient to decrease hourly basal rate to 1.1 units/hour. Dr. Alfredia Ferguson provided one time order for patient to make the change.  NOTE: Received page from MD today regarding recommendations for insulin pump adjustments due to recurrent hypoglycemia. Informed MD that it is out of the scope of practice as an Inpatient Diabetes Coordinator to make insulin pump setting adjustments. Informed MD that Diabetes Coordinator could attempt to reach out to patient's endocrinologist to get their recommendation. Called on call service for Dr. Hartford Poli (Endocrinologist) and was able to speak with Dr. Hartford Poli directly. Informed Dr. Hartford Poli of current insulin pump settings per last note by J. Rosebud Poles, RN, CDE, Diabetes Coordinator on 12/20/17. Dr. Hartford Poli recommends to have patient decrease basal rate to 1.1 units/hr (for 24 hour period) and continue Insulin to Carb Ratio 1:10 and Insulin Sensitivity Factor 1:66 mg/dl.  Spoke with Dr. Alfredia Ferguson regarding recommendations and given one time order for patient to decrease basal rate to 1.1 units/hr. Eden Lathe, RN to inform her that patient will be requested to make a change with her  basal rate but patient will be making the change herself. Called patient over the phone and she reports that she is comfortable with making pump setting adjustments. Had patient go to current basal setting which she notes is 1.8 units/hr for 24 hours. Asked patient to decrease basal rate to 1.1 units/hr which she states that she did. Then had patient go to insulin to carb ratio and she verifies her current setting is 1:10 (1 unit for 10 grams of carbs) which is what Dr. Hartford Poli wants her to continue. Then had patient go to insulin sensitivity factor and she verifies her current setting is 1:66 (1 unit drops glucose 66 mg/dl) which is what Dr. Hartford Poli wants her to continue.  Reminded patient she has an appointment coming up with Dr. Hartford Poli on 12/29/17 and asked that she make sure she goes to her appointment. Also asked patient to be sure she checks glucose at least 4 times per day and take glucose log with her to her appointment so additional setting adjustments can be made if needed. Patient verbalized understanding of information discussed and she states she has no questions at this time.  Thanks, Barnie Alderman, RN, MSN, CDE Diabetes Coordinator Inpatient Diabetes Program [phone] (Team Pager from 8am to 5pm)

## 2017-12-26 NOTE — Progress Notes (Signed)
Dr. Alfredia Ferguson notified of [name]'s BP-153/109,155/108,144/104. Orders given. One liter bolus of normal saline given and Reglan 10 mg IV given.

## 2017-12-26 NOTE — Progress Notes (Signed)
Hypoglycemic Event  CBG: 21  Treatment: D50 25 mL (12.5 gm)  Symptoms: Hungry  Follow-up CBG: Time: 545 CBG Result: 92  Possible Reasons for Event: Medication regimen: insulin pump, poor po intake  Comments/MD notified:     Regan Rakers

## 2017-12-26 NOTE — Progress Notes (Signed)
PROGRESS NOTE    Flo Berroa  WNI:[id number] DOB: Jun 04, 1990 DOA: 12/22/2017 PCP: Vicenta Aly, FNP   Brief Narrative:  HPI per Dr. Sander Radon on 12/22/17 [name] is a 27 y.o. female with medical history significant of diabetic gastroparesis, and type 1 diabetes mellitus, who presents with recurrent nausea, vomiting and abdominal pain.  Patient was discharged from the hospital 24 hours ago in a stable condition, treated for abdominal pain due to gastroparesis.  This morning around 2 AM she had recurrent symptoms, severe nausea and vomiting, multiple times, associated with abdominal pain, dull in nature, no improving or worsening factors.  Due to persistent symptoms she came back to the hospital.  At home patient on multiple antiemetics along with antiacids.  She has been able to continue insulin therapy per her insulin pump.  ED Course: Patient was found dehydrated, ill looking appearing, hypokalemic, she was referred for admission for evaluation.  *Hospitalization has been complicated by severe hypoglycemia with several episodes.  Patient has been self administering boluses and diabetes education coronary is been consulted.  If patient continues to have lows we will likely discontinue insulin pump and give her a hospital dictated sliding scale.  This morning the patient became acutely nauseous again and started vomiting violently and started stating that her abdomen was hurting again.  Her medications were transitioned to IV again and Gastroenterology was consulted. Insulin pump settings have been adjusted and Basal Rate has been reduced to 1.1 per hour per Dr. Shirlyn Goltz recommendations.   Assessment & Plan:   Active Problems:   Intractable nausea and vomiting   Intractable vomiting with nausea  Recurrent/refractive diabetic gastroparesis with associated Nausea and Vomiting -Acutely worsened again AM and because violently ill and nauseous and vomiting  -Given another  bolus and C/w IVF at 75 mL/hr -Continue symptomatic control with IVantiemeticswith IV Promethazine 25 mg q6hprn N/V/Refractory N/V.  -C/w po Ondansetron 4 mg q8hprn Nausea/Vomiting  -C/w Metoclopramide 10 mg po TID -C/w Scopolamine 1.5 TD Patch  -IV analgesics with Morphine changed to po Oxycodone 5 mg q6h but changed back to IV Morphine today due to her Acute Nausea and Vomiting -IV Famotidine changed to po 20 mg po BID but changed back to IV today  -Restarted po Pantoprazole 40 mg po Daily and changed to IV Pantoprazole  -C/w Dicyclomine 10 mg po TIDprn Spasms -Now on Soft Diet but may need to cut back -Will consult GI for further evaluation and recommendations   Type 1 Diabetes Mellitus complicated by significant Hypoglycemia  -Continue insulin infusion per insulin pump but reduce Basal rate to 1.1 -Added sliding scale for glucose coverage and monitoring but patient self bolusing and has been having lows  -Diabetes Education consulted for further evaluation and recommendation -Diet has been Advanced as tolerated to Soft Diet. -There is no clinical signs of ketoacidosis. -CBG's ranging from  29-183; Had a high of 183 today -Had Several Lows of 33, 42, 55, and 29 and had a low of 21 this AM -? If Patient is self bolusing to make herself hypoglycemic to feel bad and Nauseous and vomit so that her medications be administered IV especially IV Morphine or if she is not pausing the Insulin Pump for long enough but feel as if she was getting too much basal rate -She is to get all CBGs with hospital meter and document all insulin boluses given by the patient per pump;  -Will need to monitor extremely carefully about self boluses -Target CBG's are supposed  to be between 100-150 -Diabetes Education Coordinator to Educate further -Continue to Monitor CBG's very carefully -Basal Rate is 1.8 units/hr all day and and was adjusted at the recommendation of Dr. Hartford Poli -Diet Advanced to Soft Diet  yesterday but may cut back again due to acute Nausea and Vomiting  -If she continues to have significant lows and not adhere to Diabetes Education Coordinator will discontinue her insulin pump and give her via hospital protocol but will hold off for now -CBG's ranging from 21-173 -She is to follow up with Dr. Hartford Poli of Endocrinology as an outpatient and has an appointment on Thursday  Hypokalemia  -K+ was improved and now 3.6 yesterday -Will repeat CMP now -Continue to Monitor and Replete as Necessary  -Repeat CMP in AM   Hypomagnesemia -Mag Level this AM was 2.1 -Repeat Mag Level now -Continue to Monitor and Replete as Necessary  -Repeat Mag Level in AM   Asthma -No signs of exacerbation  -C/w Albuterol 2.5 mg IH q6hprn Wheezing and SOB  Normocytic Anemia -Patient's hemoglobin/hematocrit went from 10.1/32.1 is now 12.1/37.9 -Continue to monitor for signs and symptoms of bleeding -Checked Anemia Panel and showed iron level of 92, U IBC of 287, TIBC 379, saturation ratios of 24%, ferritin level 23, folate level 12.9, and  vitamin D B12 level 288 -Repeat CBC in the a.m.  Accelerated Hypertension -In the setting of abdominal pain and nausea vomiting-blood pressures been consistently elevated -We will place on IV hydralazine -We will give 1 L bolus -Continue to monitor blood pressures very carefully  DVT prophylaxis: Enoxaparin 40 mg sq q24h Code Status: FULL CODE Family Communication: No family present at bedside Disposition Plan: Anticipate D/C tomorrow if she does not have significant hypoglycemia   Consultants:   None  Procedures:  None   Antimicrobials:  Anti-infectives (From admission, onward)   Start     Dose/Rate Route Frequency Ordered Stop   12/26/17 1700  erythromycin ethylsuccinate (EES) 200 MG/5ML suspension 200 mg    Note to Pharmacy:  Please give before meals   200 mg Oral Every 8 hours 12/26/17 1609       Subjective: Seen and examined at bedside was  not feeling well again today.  No chest pain, lightheadedness or dizziness.  Was nauseous and vomiting earlier.  Still having trouble keeping food down and still having abdominal pain which was severe.  Patient was writhing in pain.  No other concerns or complaints at this time and discussed with her about GI consultation and she is agreeable.  Objective: Vitals:   12/26/17 0607 12/26/17 1342 12/26/17 1439 12/26/17 1600  BP: 117/67 (!) 153/109 (!) 155/108 (!) 144/104  Pulse: 99 (!) 122 (!) 115   Resp: 16 16    Temp: 98.7 F (37.1 C) 98.8 F (37.1 C)    TempSrc: Oral Oral    SpO2: 99% 100%    Weight:      Height:        Intake/Output Summary (Last 24 hours) at 12/26/2017 1942 Last data filed at 12/26/2017 0800 Gross per 24 hour  Intake 237 ml  Output -  Net 237 ml   Filed Weights   12/23/17 0103  Weight: 54.4 kg   Examination: Physical Exam:  Constitutional: Well-nourished, well-developed African-American female currently in mild distress and has been vomiting and looks uncomfortable. ENMT: External ears and nose appear normal.  Grossly normal hearing.  Mucous members are dry from vomiting Neck: Appears supple no JVD Respiratory: Slightly diminished to  auscultation bilaterally no appreciable wheezing, rales, rhonchi.  Patient had unlabored breathing is not wearing any supplemental oxygen via nasal cannula Cardiovascular: Tachycardic rate but regular rhythm.  Has no appreciable lower extremity edema Abdomen: Soft, tender to palpate.  Nondistended.  Bowel sounds present all 4 quadrants GU: Deferred Musculoskeletal: No contractures or cyanosis Skin: No appreciable rashes or lesions on limited skin evaluation Neurologic: Cranial nerves II through XII grossly intact no appreciable focal deficits Psychiatric: Anxious mood and affect.  Normal judgment and insight.  Patient is awake, alert and oriented  Data Reviewed: I have personally reviewed following labs and imaging  studies  CBC: Recent Labs  Lab 12/22/17 0726 12/23/17 0350 12/24/17 0433 12/25/17 0523 12/26/17 0544  WBC 6.4 6.9 6.2 10.5 6.9  NEUTROABS 5.0  --  3.2 7.2 3.7  HGB 10.1* 10.0* 10.3* 12.5 12.1  HCT 32.1* 32.5* 31.9* 40.1 37.9  MCV 92.0 91.5 88.1 91.6 89.0  PLT PLATELET CLUMPS NOTED ON SMEAR, UNABLE TO ESTIMATE 420* 377 512* 106*   Basic Metabolic Panel: Recent Labs  Lab 12/21/17 0608 12/22/17 0726 12/22/17 0855 12/23/17 0350 12/24/17 0433 12/25/17 0523  NA 141 142  --  138 139 142  K 4.1 3.0*  --  4.4 4.4 3.6  CL 108 110  --  107 107 108  CO2 25 23  --  23 23 25   GLUCOSE 106* 157*  --  373* 106* 81  BUN 10 6  --  5* 5* <5*  CREATININE 0.60 0.41*  --  0.63 0.47 0.57  CALCIUM 8.3* 7.5*  --  8.3* 8.4* 9.2  MG  --   --  1.4* 1.9 1.6* 2.1  PHOS  --   --   --   --  4.0 3.8   GFR: Estimated Creatinine Clearance: 89.9 mL/min (by C-G formula based on SCr of 0.57 mg/dL). Liver Function Tests: Recent Labs  Lab 12/20/17 0641 12/22/17 0726 12/24/17 0433 12/25/17 0523  AST 19 18 18 17   ALT 11 12 10 11   ALKPHOS 56 61 63 76  BILITOT 0.5 0.5 0.4 0.8  PROT 5.4* 6.4* 5.4* 6.9  ALBUMIN 3.0* 3.5 2.9* 3.8   Recent Labs  Lab 12/22/17 0726  LIPASE 18   No results for input(s): AMMONIA in the last 168 hours. Coagulation Profile: No results for input(s): INR, PROTIME in the last 168 hours. Cardiac Enzymes: No results for input(s): CKTOTAL, CKMB, CKMBINDEX, TROPONINI in the last 168 hours. BNP (last 3 results) No results for input(s): PROBNP in the last 8760 hours. HbA1C: No results for input(s): HGBA1C in the last 72 hours. CBG: Recent Labs  Lab 12/26/17 0519 12/26/17 0556 12/26/17 0730 12/26/17 1214 12/26/17 1720  GLUCAP 21* 92 168* 168* 173*   Lipid Profile: No results for input(s): CHOL, HDL, LDLCALC, TRIG, CHOLHDL, LDLDIRECT in the last 72 hours. Thyroid Function Tests: No results for input(s): TSH, T4TOTAL, FREET4, T3FREE, THYROIDAB in the last 72  hours. Anemia Panel: Recent Labs    12/25/17 0523  VITAMINB12 288  FOLATE 12.9  FERRITIN 23  TIBC 379  IRON 92  RETICCTPCT 2.1   Sepsis Labs: No results for input(s): PROCALCITON, LATICACIDVEN in the last 168 hours.  No results found for this or any previous visit (from the past 240 hour(s)).  Radiology Studies: No results found. Scheduled Meds: . enoxaparin (LOVENOX) injection  40 mg Subcutaneous Q24H  . erythromycin ethylsuccinate  200 mg Oral Q8H  . insulin pump   Subcutaneous Q4H  . metoCLOPramide  10 mg Oral TID WC  . pantoprazole (PROTONIX) IV  40 mg Intravenous Q24H   Continuous Infusions: . sodium chloride 1,000 mL (12/26/17 1025)  . famotidine (PEPCID) IV      LOS: 3 days   Kerney Elbe, DO [facility] PAGER is on [place]  If 7PM-7AM, please contact night-coverage [email or web address] Password [facility] 12/26/2017, 7:42 PM

## 2017-12-26 NOTE — Progress Notes (Signed)
Pt stated she felt like her blood sugar was dropping. It was 124 at HS. We checked it and it was 21. Pt alert, oriented, only c/o hunger and numbness of her tongue which she says is typical when her BS drops. She hasn't given herself any boluses via insulin pump tonight; it has run at its basal rate, but she has now suspended the pump in light of hypoglycemia. D50 40mL given, will recheck CBG in 15 min. Hortencia Conradi RN

## 2017-12-27 LAB — COMPREHENSIVE METABOLIC PANEL
ALT: 10 U/L (ref 0–44)
AST: 16 U/L (ref 15–41)
Albumin: 2.7 g/dL — ABNORMAL LOW (ref 3.5–5.0)
Alkaline Phosphatase: 59 U/L (ref 38–126)
Anion gap: 6 (ref 5–15)
BUN: 7 mg/dL (ref 6–20)
CO2: 24 mmol/L (ref 22–32)
Calcium: 8.4 mg/dL — ABNORMAL LOW (ref 8.9–10.3)
Chloride: 109 mmol/L (ref 98–111)
Creatinine, Ser: 0.68 mg/dL (ref 0.44–1.00)
GFR calc Af Amer: >60 mL/min (ref >60)
Glucose, Bld: 164 mg/dL — ABNORMAL HIGH (ref 70–99)
Potassium: 4.8 mmol/L (ref 3.5–5.1)
Sodium: 139 mmol/L (ref 135–145)
Total Bilirubin: 0.8 mg/dL (ref 0.3–1.2)
Total Protein: 4.9 g/dL — ABNORMAL LOW (ref 6.5–8.1)

## 2017-12-27 LAB — CBC WITH DIFFERENTIAL/PLATELET
ABS IMMATURE GRANULOCYTES: 0.04 10*3/uL (ref 0.00–0.07)
Basophils Absolute: 0 10*3/uL (ref 0.0–0.1)
Basophils Relative: 1 %
Eosinophils Absolute: 0.3 10*3/uL (ref 0.0–0.5)
Eosinophils Relative: 5 %
HCT: 31.7 % — ABNORMAL LOW (ref 36.0–46.0)
Hemoglobin: 10.1 g/dL — ABNORMAL LOW (ref 12.0–15.0)
Immature Granulocytes: 1 %
Lymphocytes Relative: 48 %
Lymphs Abs: 2.9 10*3/uL (ref 0.7–4.0)
MCH: 28.3 pg (ref 26.0–34.0)
MCHC: 31.9 g/dL (ref 30.0–36.0)
MCV: 88.8 fL (ref 80.0–100.0)
Monocytes Absolute: 0.5 10*3/uL (ref 0.1–1.0)
Monocytes Relative: 8 %
NEUTROS ABS: 2.2 10*3/uL (ref 1.7–7.7)
Neutrophils Relative %: 37 %
PLATELETS: 335 10*3/uL (ref 150–400)
RBC: 3.57 MIL/uL — ABNORMAL LOW (ref 3.87–5.11)
RDW: 14.2 % (ref 11.5–15.5)
WBC: 6 10*3/uL (ref 4.0–10.5)
nRBC: 0 % (ref 0.0–0.2)

## 2017-12-27 LAB — GLUCOSE, CAPILLARY
GLUCOSE-CAPILLARY: 102 mg/dL — AB (ref 70–99)
Glucose-Capillary: 30 mg/dL — CL (ref 70–99)
Glucose-Capillary: 33 mg/dL — CL (ref 70–99)
Glucose-Capillary: 53 mg/dL — ABNORMAL LOW (ref 70–99)
Glucose-Capillary: 180 mg/dL — ABNORMAL HIGH (ref 70–99)
Glucose-Capillary: 74 mg/dL (ref 70–99)

## 2017-12-27 LAB — PHOSPHORUS: Phosphorus: 3.6 mg/dL (ref 2.5–4.6)

## 2017-12-27 LAB — MAGNESIUM: MAGNESIUM: 1.7 mg/dL (ref 1.7–2.4)

## 2017-12-27 MED ORDER — MAGNESIUM SULFATE 2 GM/50ML IV SOLN: 2.0000 g | Freq: Once | INTRAVENOUS | Status: DC

## 2017-12-27 MED ORDER — PROMETHAZINE HCL 25 MG/ML IJ SOLN: 25.0000 mg | Freq: Four times a day (QID) | INTRAMUSCULAR | Status: DC | PRN

## 2017-12-27 MED ORDER — PROMETHAZINE HCL 25 MG PO TABS: 12.5000 mg | ORAL_TABLET | Freq: Four times a day (QID) | ORAL | Status: DC | PRN

## 2017-12-27 MED ORDER — ERYTHROMYCIN ETHYLSUCCINATE 200 MG/5ML PO SUSR: 200.0000 mg | Freq: Three times a day (TID) | ORAL | 0 refills | Status: DC

## 2017-12-27 MED ORDER — MAGNESIUM OXIDE 400 (241.3 MG) MG PO TABS
400.0000 mg | ORAL_TABLET | Freq: Two times a day (BID) | ORAL | Status: DC
  Administered 2017-12-27: 400 mg via ORAL
  Filled 2017-12-27: qty 1

## 2017-12-27 MED ORDER — FAMOTIDINE 20 MG PO TABS
20.0000 mg | ORAL_TABLET | Freq: Two times a day (BID) | ORAL | Status: DC
  Administered 2017-12-27: 20 mg via ORAL
  Filled 2017-12-27: qty 1

## 2017-12-27 MED ORDER — ONDANSETRON HCL 4 MG/2ML IJ SOLN: 4.0000 mg | Freq: Four times a day (QID) | INTRAMUSCULAR | Status: DC | PRN

## 2017-12-27 MED ORDER — GLUCOSE 4 G PO CHEW
CHEWABLE_TABLET | ORAL | Status: AC
  Administered 2017-12-27: 02:00:00
  Filled 2017-12-27: qty 1

## 2017-12-27 MED ORDER — PANTOPRAZOLE SODIUM 40 MG PO TBEC
40.0000 mg | DELAYED_RELEASE_TABLET | Freq: Every day | ORAL | Status: DC
  Administered 2017-12-27: 40 mg via ORAL
  Filled 2017-12-27: qty 1

## 2017-12-27 MED ORDER — MAGNESIUM OXIDE 400 (241.3 MG) MG PO TABS: 400.0000 mg | ORAL_TABLET | Freq: Two times a day (BID) | ORAL | 0 refills | Status: DC

## 2017-12-27 MED ORDER — DEXTROSE 50 % IV SOLN
INTRAVENOUS | Status: AC
  Administered 2017-12-27: 25 mL
  Filled 2017-12-27: qty 50

## 2017-12-27 NOTE — Progress Notes (Signed)
Hypoglycemic Event  CBG: 37  Treatment: 4 oz juice/soda and 8 oz juice/soda  Symptoms: Hungry  Follow-up CBG: Time:0105 CBG Result:53  Possible Reasons for Event: Inadequate meal intake and Medication regimen: insulin pump  Comments/MD notified:    Regan Rakers

## 2017-12-27 NOTE — Progress Notes (Addendum)
Discharge instructions explained to patient. She was instructed to call her PCP or Endocrinologist or Gi  MD to get them to write a letter for her insurance to allow her to get erythromycin. Medicaid has refused it. Pt refused wheelchair at discharge ,the Tech walked her to the front door. Mother to picked pt up. Handouts given to pt about gastroparesis and DM1.

## 2017-12-27 NOTE — Progress Notes (Signed)
[name] 9:07 AM  Subjective: Patient doing well feeling better likes erythromycin has been on it before but has not eaten breakfast yet hospital computer chart reviewed case discussed with my partner Dr. Oletta Lamas  Objective: Signs stable afebrile no acute distress abdomen is soft nontender labs okay  Assessment: Gastroparesis  Plan: Please call me if I can be of any further assistance with this hospital stay hopefully patient will be able to go home soon and can follow-up with her primary gastroenterologist Dr. Paulita Fujita in a few weeks  [facility] E  Pager [phone] After 5PM or if no answer call [phone]

## 2017-12-27 NOTE — Discharge Summary (Signed)
Physician Discharge Summary  [name] WPY:[id number] DOB: 07/26/1990 DOA: 12/22/2017  PCP: Vicenta Aly, FNP  Admit date: 12/22/2017 Discharge date: 12/27/2017  Admitted From: Home Disposition: Home  Recommendations for Outpatient Follow-up:  1. Follow up with PCP in 1-2 weeks 2. Follow up with GI Dr. Paulita Fujita in 1-2 weeks 3. Follow up with Endocrinology Dr. Hartford Poli on 12/30/17 4. Please obtain CMP/CBC, Mag, Phos in one week 5. Please follow up on the following pending results:  Home Health: No Equipment/Devices: None  Discharge Condition: Stable CODE STATUS: FULL CODE Diet recommendation: Carb Modified Diet  Brief/Interim Summary: HPI per Dr. Riccardo Dubin Arrien on 12/22/17 [name] a 27 y.o.femalewith medical history significant ofdiabetic gastroparesis,andtype 1 diabetes mellitus, who presents with recurrent nausea, vomiting and abdominal pain. Patient was discharged from the hospital 24 hours ago in a stable condition, treated for abdominal pain due to gastroparesis. This morning around 2 AM she had recurrent symptoms, severe nausea and vomiting, multiple times, associated with abdominal pain, dull in nature, no improving or worsening factors. Due to persistent symptoms she came back to the hospital. At home patient on multiple antiemetics along with antiacids. She has been able to continue insulin therapy per her insulin pump.  ED Course:Patient was found dehydrated, ill looking appearing, hypokalemic, she was referred for admission for evaluation.  *Hospitalization has been complicated by severe hypoglycemia with several episodes.  Patient has been self administering boluses and diabetes education coronary is been consulted.  If patient continues to have lows we will likely discontinue insulin pump and give her a hospital dictated sliding scale.  This morning the patient became acutely nauseous again and started vomiting violently and started stating that  her abdomen was hurting again.  Her medications were transitioned to IV again and Gastroenterology was consulted. Insulin pump settings have been adjusted and Basal Rate has been reduced to 1.1 per hour per Dr. Shirlyn Goltz recommendations.   Improved and was able to tolerate food without any issues and blood sugars were better controlled.  Gastroenterology recommended continue erythromycin as she did well with that however unfortunately her insurance would not pay for it so she will need a prior authorization filled out by her PCP or gastroenterology in outpatient setting.  She is deemed stable for discharge as she improved and no longer vomiting taking p.o. adequately.  She will need follow-up with PCP, endocrinology as well as gastroenterology within the coming weeks.  Discharge Diagnoses:  Active Problems:   Intractable nausea and vomiting   Intractable vomiting with nausea  Recurrent/refractive diabetic gastroparesis with associated Nausea and Vomiting, improved -Acutely worsened again AM and because violently ill and nauseous and vomiting  -Given another bolus and C/w IVF at 75 mL/hr until discharge -Continue symptomatic control with IVantiemeticswith IV Promethazine 25 mg q6hprn N/V/Refractory N/V.  -C/w po Ondansetron 4 mg q8hprn Nausea/Vomiting  -C/w Metoclopramide 10 mg po TID -C/w Scopolamine 1.5 TD Patch  -IV analgesics with Morphine changed to po Oxycodone 5 mg q6h -IV Famotidine changed to po 20 mg po BID -Restarted po Pantoprazole 40 mg po Daily -C/w Dicyclomine 10 mg po TIDprn Spasms -Now on Soft Diet but may need to cut back -Will consult GI for further evaluation and recommendations  -GI recommended continuing erythromycin and recommended sending patient on erythromycin however patient insurance did not cover it she is already on metoclopramide.  She will need a prior authorization to be filled out by her PCP or gastroenterology in outpatient setting  Type 1 Diabetes Mellitus  complicated by significant Hypoglycemia,, improved after insulin infusion rate was adjusted -Continueinsulin infusion per insulin pump but reduce Basal rate to 1.1 -Added sliding scale for glucose coverage and monitoring but patient self bolusing and has been having lows -Diabetes Education consulted for further evaluation and recommendation -Diet has been Advanced as tolerated to Soft Diet. -There is no clinical signs of ketoacidosis. -CBG's ranging from  29-183; Had a high of 183 today -Had Several Lows of 33, 42, 55, and 29 and had a low of 21 hospitalization prior to her rate being adjusted -CBGs have been relatively stable and she has appointment with Dr. Oren Binet in outpatient setting on 12/30/2017 -? If Patient is self bolusing to make herself hypoglycemic to feel bad and Nauseous and vomit so that her medications be administered IV especially IV Morphine or if she is not pausing the Insulin Pump for long enough but feel as if she was getting too much basal rate -She is to get all CBGs with hospital meter and document all insulin boluses given by the patient per pump;  -Will need to monitor extremely carefully about self boluses -Target CBG's are supposed to be between 100-150 -Diabetes Education Coordinator to Educate further -Continue to Monitor CBG's very carefully -Basal Rate is 1.8 units/hr all day and and was adjusted at the recommendation of Dr. Hartford Poli and is now 1.1 units/h all day -Diet Advanced to Soft Diet yesterday and she is tolerating this well -If she continues to have significant lows and not adhere to Diabetes Education Coordinator will discontinue her insulin pump and give her via hospital protocol but will hold off for now -CBG's ranging from 37-180 -She is to follow up with Dr. Hartford Poli of Endocrinology as an outpatient and has an appointment on Thursday 12/30/2017 and will need to keep this appointment  Hypokalemia  -K+ was improved and now 4.8 -Continue to Monitor and  Replete as Necessary  -Repeat CMP as an outpatient   Hypomagnesemia -Mag Level this AM was 1.7 -Replete with p.o. mag oxide -Continue to Monitor and Replete as Necessary  -Repeat Mag Level in AM   Asthma -No signs of exacerbation  -C/w Albuterol 2.5 mg IH q6hprn Wheezing and SOB  Normocytic Anemia -Patient's hemoglobin/hematocrit is stable at 10.1/31.7 -Continue to monitor for signs and symptoms of bleeding -Checked Anemia Panel and showed iron level of 92, U IBC of 287, TIBC 379, saturation ratios of 24%, ferritin level 23, folate level 12.9, and  vitamin D B12 level 288 -Repeat CBC as an outpatient  Accelerated Hypertension -In the setting of abdominal pain and nausea vomiting-blood pressures been consistently elevated -Pressure is improved in the 111/74 -We will place on IV hydralazine if blood sugars remain high -We will give 1 L bolus -Continue to monitor blood pressures very carefully  Discharge Instructions  Discharge Instructions    Call MD for:  difficulty breathing, headache or visual disturbances   Complete by:  As directed    Call MD for:  extreme fatigue   Complete by:  As directed    Call MD for:  hives   Complete by:  As directed    Call MD for:  persistant dizziness or light-headedness   Complete by:  As directed    Call MD for:  persistant nausea and vomiting   Complete by:  As directed    Call MD for:  redness, tenderness, or signs of infection (pain, swelling, redness, odor or green/yellow discharge around incision site)   Complete by:  As directed    Call MD for:  severe uncontrolled pain   Complete by:  As directed    Call MD for:  temperature >100.4   Complete by:  As directed    Diet - low sodium heart healthy   Complete by:  As directed    Diet Carb Modified   Complete by:  As directed    Discharge instructions   Complete by:  As directed    You were cared for by a hospitalist during your hospital stay. If you have any questions about  your discharge medications or the care you received while you were in the hospital after you are discharged, you can call the unit and ask to speak with the hospitalist on call if the hospitalist that took care of you is not available. Once you are discharged, your primary care physician will handle any further medical issues. Please note that NO REFILLS for any discharge medications will be authorized once you are discharged, as it is imperative that you return to your primary care physician (or establish a relationship with a primary care physician if you do not have one) for your aftercare needs so that they can reassess your need for medications and monitor your lab values.  Follow up with PCP, Endocrinology, and Gastroenterology as an outpatient. Take all medications as prescribed. If symptoms change or worsen please return to the ED for evaluation   Increase activity slowly   Complete by:  As directed      Allergies as of 12/27/2017      Reactions   Peanut-containing Drug Products Swelling, Other (See Comments)   Reaction:  Swelling of mouth and lips    Food Swelling, Other (See Comments)   Pt is allergic to strawberries.   Reaction:  Swelling of mouth and lips    Ultram [tramadol] Itching      Medication List    STOP taking these medications   insulin aspart 100 UNIT/ML FlexPen Commonly known as:  NOVOLOG   Insulin Glargine 100 UNIT/ML Solostar Pen Commonly known as:  LANTUS     TAKE these medications   albuterol 108 (90 Base) MCG/ACT inhaler Commonly known as:  PROVENTIL HFA;VENTOLIN HFA Inhale 1-2 puffs into the lungs every 6 (six) hours as needed for wheezing or shortness of breath.   blood glucose meter kit and supplies Dispense based on patient and insurance preference. Use up to four times daily as directed. (FOR ICD-10 E10.9, E11.9).   dicyclomine 10 MG capsule Commonly known as:  BENTYL Take 1 capsule (10 mg total) by mouth 3 (three) times daily as needed for  spasms.   erythromycin ethylsuccinate 200 MG/5ML suspension Commonly known as:  EES Take 5 mLs (200 mg total) by mouth every 8 (eight) hours.   famotidine 20 MG tablet Commonly known as:  PEPCID Take 1 tablet (20 mg total) by mouth 2 (two) times daily.   glucagon 1 MG injection Inject 1 mg into the muscle once as needed (For very low blood sugars. May repeat in 15-20 minutes if needed.).   Insulin Pen Needle 31G X 5 MM Misc 30 Units by Does not apply route at bedtime.   magnesium oxide 400 (241.3 Mg) MG tablet Commonly known as:  MAG-OX Take 1 tablet (400 mg total) by mouth 2 (two) times daily.   metoCLOPramide 10 MG tablet Commonly known as:  REGLAN Take 1 tablet (10 mg total) by mouth 3 (three) times daily with meals. What changed:  when to take this  reasons to take this   ondansetron 4 MG disintegrating tablet Commonly known as:  ZOFRAN-ODT Take 1 tablet (4 mg total) by mouth every 8 (eight) hours as needed for nausea or vomiting.   pantoprazole 40 MG tablet Commonly known as:  PROTONIX Take 1 tablet (40 mg total) by mouth daily.   promethazine 25 MG suppository Commonly known as:  PHENERGAN Place 1 suppository (25 mg total) rectally every 6 (six) hours as needed for nausea or vomiting.   scopolamine 1 MG/3DAYS Commonly known as:  TRANSDERM-SCOP Place 1 patch (1.5 mg total) onto the skin every 3 (three) days as needed (during episodes of vomiting).      Follow-up Information    Vicenta Aly, [place]. Call.   Specialty:  Nurse Practitioner Why:  Follow up within 1 week  Contact information: [place] [address] [phone]        Gerome Apley, MD Follow up.   Specialty:  Endocrinology Why:  Follow up Appointment for Thursday 12/30/17 Contact information: [address] [place] [address] [phone]        Arta Silence, MD. Call.   Specialty:  Gastroenterology Why:  Follow up within  1-2 weeks Contact information: [address] [place] [address] [phone]          Allergies  Allergen Reactions  . Peanut-Containing Drug Products Swelling and Other (See Comments)    Reaction:  Swelling of mouth and lips   . 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   Reaction:  Swelling of mouth and lips   . Ultram [Tramadol] Itching   Consultations:   Procedures/Studies: Dg Abd 2 Views  Result Date: 12/03/2017 CLINICAL DATA:  Emesis EXAM: ABDOMEN - 2 VIEW COMPARISON:  11/29/2017 FINDINGS: Visualized lung bases clear. No free air. Normal bowel gas pattern.  Cholecystectomy clips. Stable small left pelvic phlebolith. Regional bones unremarkable. IMPRESSION: Negative. Electronically Signed   By: [name] M.D.   On: 12/03/2017 15:20   Dg Abd Acute W/chest  Result Date: 12/22/2017 CLINICAL DATA:  Abdominal pain and nausea for 2 days EXAM: DG ABDOMEN ACUTE W/ 1V CHEST COMPARISON:  12/03/2017 FINDINGS: Cardiac shadow is within normal limits. The lungs are well aerated bilaterally. No focal infiltrate or sizable effusion is seen. Scattered large and small bowel gas is noted. Fecal material is noted throughout the colon consistent with a mild degree of constipation. No free air is seen. Changes of prior cholecystectomy are noted. No bony abnormality is noted. IMPRESSION: Mild constipation. No other focal abnormality is noted. Electronically Signed   By: Inez Catalina M.D.   On: 12/22/2017 09:46   Dg Abd Acute W/chest  Result Date: 11/29/2017 CLINICAL DATA:  Persistent lower abdominal pain with nausea and vomiting. EXAM: DG ABDOMEN ACUTE W/ 1V CHEST COMPARISON:  Abdominal CT scan of November 26, 2017 and abdominal radiographs of November 17, 2017 FINDINGS: The lungs are well-expanded and clear. The heart and pulmonary vascularity are normal. The mediastinum is normal in width. The trachea is midline. Within the abdomen there is a  relative paucity of bowel gas. The stool burden is not excessive. There is no evidence of a fecal impaction. There are surgical clips in the gallbladder fossa. The bony structures exhibit no acute abnormalities. IMPRESSION: There is no acute cardiopulmonary abnormality. No acute intra-abdominal abnormality is observed either. Electronically Signed   By: Shanon Brow  [place] M.D.   On: 11/29/2017 08:02   [place] Ekg Site Rite  Result Date: 12/03/2017 If Site Rite image not attached, placement could not be confirmed due to current cardiac rhythm.    Subjective: Seen and examined at bedside was doing well and was tolerating diet without any issues and states that she felt better with erythromycin.  No nausea or vomiting.  Had no trouble eating and had no abdominal pain.  She felt well and was ready to go home.   Discharge Exam: Vitals:   12/27/17 0400 12/27/17 1419  BP: 106/84 111/74  Pulse: 93 96  Resp: 14 16  Temp: 98.5 F (36.9 C) 99.1 F (37.3 C)  SpO2: 100% 98%   Vitals:   12/26/17 1600 12/26/17 2110 12/27/17 0400 12/27/17 1419  BP: (!) 144/104 116/75 106/84 111/74  Pulse:  96 93 96  Resp:  _0 Temp:  98.7 F (37.1 C) 98.5 F (36.9 C) 99.1 F (37.3 C)  TempSrc:  Oral Oral Oral  SpO2:  99% 100% 98%  Weight:      Height:       General: Pt is alert, awake, not in acute distress Cardiovascular: RRR, S1/S2 +, no rubs, no gallops Respiratory: CTA bilaterally, no wheezing, no rhonchi Abdominal: Soft, NT, ND, bowel sounds + Extremities: no edema, no cyanosis  The results of significant diagnostics from this hospitalization (including imaging, microbiology, ancillary and laboratory) are listed below for reference.    Microbiology: No results found for this or any previous visit (from the past 240 hour(s)).   Labs: BNP (last 3 results) No results for input(s): BNP in the last 8760 hours. Basic Metabolic Panel: Recent Labs  Lab 12/23/17 0350 12/24/17 0433 12/25/17 0523  12/26/17 2309 12/27/17 0533  NA 138 139 142 143 139  K 4.4 4.4 3.6 5.7* 4.8  CL 107 107 108 113* 109  CO2 _1 20* 24  GLUCOSE 373* 106* 81 57* 164*  BUN 5* 5* <5* 7 7  CREATININE 0.63 0.47 0.57 0.69 0.68  CALCIUM 8.3* 8.4* 9.2 9.2 8.4*  MG 1.9 1.6* 2.1 1.6* 1.7  PHOS  --  4.0 3.8 4.3 3.6   Liver Function Tests: Recent Labs  Lab 12/22/17 0726 12/24/17 0433 12/25/17 0523 12/26/17 2309 12/27/17 0533  AST _2 ALT _3 ALKPHOS 61 63 76 66 59  BILITOT 0.5 0.4 0.8 1.1 0.8  PROT 6.4* 5.4* 6.9 6.2* 4.9*  ALBUMIN 3.5 2.9* 3.8 3.3* 2.7*   Recent Labs  Lab 12/22/17 0726  LIPASE 18   No results for input(s): AMMONIA in the last 168 hours. CBC: Recent Labs  Lab 12/22/17 0726 12/23/17 0350 12/24/17 0433 12/25/17 0523 12/26/17 0544 12/27/17 0533  WBC 6.4 6.9 6.2 10.5 6.9 6.0  NEUTROABS 5.0  --  3.2 7.2 3.7 2.2  HGB 10.1* 10.0* 10.3* 12.5 12.1 10.1*  HCT 32.1* 32.5* 31.9* 40.1 37.9 31.7*  MCV 92.0 91.5 88.1 91.6 89.0 88.8  PLT PLATELET CLUMPS NOTED ON SMEAR, UNABLE TO ESTIMATE 420* 377 512* 451* 335   Cardiac Enzymes: No results for input(s): CKTOTAL, CKMB, CKMBINDEX, TROPONINI in the last 168 hours. BNP: Invalid input(s): POCBNP CBG: Recent Labs  Lab 12/26/17 2108 12/27/17 0105 12/27/17 0142 12/27/17 0751 12/27/17 1211  GLUCAP 91 53* 180* 74 102*   D-Dimer No results for input(s): DDIMER in the last 72 hours. Hgb A1c No results for input(s): HGBA1C in the last  72 hours. Lipid Profile No results for input(s): CHOL, HDL, LDLCALC, TRIG, CHOLHDL, LDLDIRECT in the last 72 hours. Thyroid function studies No results for input(s): TSH, T4TOTAL, T3FREE, THYROIDAB in the last 72 hours.  Invalid input(s): FREET3 Anemia work up Recent Labs    12/25/17 0523  VITAMINB12 288  FOLATE 12.9  FERRITIN 23  TIBC 379  IRON 92  RETICCTPCT 2.1   Urinalysis    Component Value Date/Time   COLORURINE STRAW (A) 12/22/2017 0726   APPEARANCEUR  CLEAR 12/22/2017 0726   LABSPEC 1.008 12/22/2017 0726   PHURINE 8.0 12/22/2017 0726   GLUCOSEU 50 (A) 12/22/2017 0726   GLUCOSEU >=1000 11/07/2012 1205   HGBUR LARGE (A) 12/22/2017 0726   BILIRUBINUR NEGATIVE 12/22/2017 0726   KETONESUR 5 (A) 12/22/2017 0726   PROTEINUR NEGATIVE 12/22/2017 0726   UROBILINOGEN 0.2 11/24/2014 1045   NITRITE NEGATIVE 12/22/2017 0726   LEUKOCYTESUR SMALL (A) 12/22/2017 0726   Sepsis Labs Invalid input(s): PROCALCITONIN,  WBC,  LACTICIDVEN Microbiology No results found for this or any previous visit (from the past 240 hour(s)).  Time coordinating discharge: 35 minutes  SIGNED:  Kerney Elbe, DO [facility] 12/27/2017, 8:47 PM Pager is on [place]  If 7PM-7AM, please contact night-coverage [email or web address] Password [email or web address]

## 2017-12-27 NOTE — Progress Notes (Signed)
Hypoglycemic Event  CBG: 53  Treatment: 4 oz juice/soda and D50 25 mL (12.5 gm)  Symptoms: Hungry  Follow-up CBG: LPNP:0051 CBG Result:180  Possible Reasons for Event: Inadequate meal intake and Medication regimen: insulin pump  Comments/MD notified    Duanne Limerick [facility]

## 2017-12-28 LAB — GLUCOSE, CAPILLARY: Glucose-Capillary: 37 mg/dL — CL (ref 70–99)

## 2017-12-31 ENCOUNTER — Emergency Department (HOSPITAL_COMMUNITY)
Admission: EM | Admit: 2017-12-31 | Discharge: 2017-12-31 | Disposition: A | Discharge status: Home / Self Care | Payer: Medicaid Other | Attending: Emergency Medicine | Admitting: Emergency Medicine

## 2017-12-31 ENCOUNTER — Encounter (HOSPITAL_COMMUNITY): Payer: Self-pay

## 2017-12-31 ENCOUNTER — Emergency Department (HOSPITAL_COMMUNITY)
Admission: EM | Admit: 2017-12-31 | Discharge: 2017-12-31 | Disposition: A | Discharge status: Home / Self Care | Payer: Medicaid Other | Source: Home / Self Care | Attending: Emergency Medicine | Admitting: Emergency Medicine

## 2017-12-31 ENCOUNTER — Other Ambulatory Visit: Payer: Self-pay

## 2017-12-31 DIAGNOSIS — R1084 Generalized abdominal pain: Secondary | ICD-10-CM

## 2017-12-31 DIAGNOSIS — E109 Type 1 diabetes mellitus without complications: Secondary | ICD-10-CM

## 2017-12-31 DIAGNOSIS — I5032 Chronic diastolic (congestive) heart failure: Secondary | ICD-10-CM | POA: Insufficient documentation

## 2017-12-31 DIAGNOSIS — Z91010 Allergy to peanuts: Secondary | ICD-10-CM | POA: Insufficient documentation

## 2017-12-31 DIAGNOSIS — I110 Hypertensive heart disease with heart failure: Secondary | ICD-10-CM | POA: Insufficient documentation

## 2017-12-31 DIAGNOSIS — R109 Unspecified abdominal pain: Secondary | ICD-10-CM | POA: Insufficient documentation

## 2017-12-31 DIAGNOSIS — F419 Anxiety disorder, unspecified: Secondary | ICD-10-CM

## 2017-12-31 DIAGNOSIS — K3184 Gastroparesis: Secondary | ICD-10-CM | POA: Insufficient documentation

## 2017-12-31 DIAGNOSIS — N3090 Cystitis, unspecified without hematuria: Secondary | ICD-10-CM | POA: Diagnosis not present

## 2017-12-31 DIAGNOSIS — R112 Nausea with vomiting, unspecified: Secondary | ICD-10-CM | POA: Insufficient documentation

## 2017-12-31 DIAGNOSIS — J45909 Unspecified asthma, uncomplicated: Secondary | ICD-10-CM | POA: Insufficient documentation

## 2017-12-31 DIAGNOSIS — Z79899 Other long term (current) drug therapy: Secondary | ICD-10-CM | POA: Insufficient documentation

## 2017-12-31 DIAGNOSIS — Z9049 Acquired absence of other specified parts of digestive tract: Secondary | ICD-10-CM | POA: Insufficient documentation

## 2017-12-31 DIAGNOSIS — F329 Major depressive disorder, single episode, unspecified: Secondary | ICD-10-CM | POA: Insufficient documentation

## 2017-12-31 LAB — URINALYSIS, ROUTINE W REFLEX MICROSCOPIC
BILIRUBIN URINE: NEGATIVE
Bilirubin Urine: NEGATIVE
Glucose, UA: 50 mg/dL — AB
Ketones, ur: 20 mg/dL — AB
Ketones, ur: 20 mg/dL — AB
Nitrite: NEGATIVE
Nitrite: NEGATIVE
Protein, ur: 100 mg/dL — AB
Protein, ur: >=300 mg/dL — AB
RBC / HPF: >50 RBC/hpf — ABNORMAL HIGH (ref 0–5)
RBC / HPF: >50 RBC/hpf — ABNORMAL HIGH (ref 0–5)
SPECIFIC GRAVITY, URINE: 1.021 (ref 1.005–1.030)
Specific Gravity, Urine: 1.015 (ref 1.005–1.030)
WBC, UA: >50 WBC/hpf — ABNORMAL HIGH (ref 0–5)
pH: 7 (ref 5.0–8.0)
pH: 7 (ref 5.0–8.0)

## 2017-12-31 LAB — COMPREHENSIVE METABOLIC PANEL
ALT: 12 U/L (ref 0–44)
AST: 13 U/L — ABNORMAL LOW (ref 15–41)
Albumin: 3.9 g/dL (ref 3.5–5.0)
Alkaline Phosphatase: 72 U/L (ref 38–126)
Anion gap: 10 (ref 5–15)
BUN: 17 mg/dL (ref 6–20)
CO2: 29 mmol/L (ref 22–32)
CREATININE: 0.85 mg/dL (ref 0.44–1.00)
Calcium: 8.9 mg/dL (ref 8.9–10.3)
Chloride: 98 mmol/L (ref 98–111)
GFR calc Af Amer: >60 mL/min (ref >60)
GFR calc non Af Amer: >60 mL/min (ref >60)
Glucose, Bld: 226 mg/dL — ABNORMAL HIGH (ref 70–99)
Potassium: 3.5 mmol/L (ref 3.5–5.1)
Sodium: 137 mmol/L (ref 135–145)
Total Bilirubin: 0.8 mg/dL (ref 0.3–1.2)
Total Protein: 6.6 g/dL (ref 6.5–8.1)

## 2017-12-31 LAB — CBC
HCT: 38.4 % (ref 36.0–46.0)
Hemoglobin: 12 g/dL (ref 12.0–15.0)
MCH: 28.8 pg (ref 26.0–34.0)
MCHC: 31.3 g/dL (ref 30.0–36.0)
MCV: 92.3 fL (ref 80.0–100.0)
Platelets: 390 10*3/uL (ref 150–400)
RBC: 4.16 MIL/uL (ref 3.87–5.11)
RDW: 13.8 % (ref 11.5–15.5)
WBC: 8 10*3/uL (ref 4.0–10.5)
nRBC: 0 % (ref 0.0–0.2)

## 2017-12-31 LAB — I-STAT CHEM 8, ED
BUN: 14 mg/dL (ref 6–20)
CHLORIDE: 103 mmol/L (ref 98–111)
Calcium, Ion: 1.13 mmol/L — ABNORMAL LOW (ref 1.15–1.40)
Creatinine, Ser: 0.7 mg/dL (ref 0.44–1.00)
Glucose, Bld: 105 mg/dL — ABNORMAL HIGH (ref 70–99)
HCT: 37 % (ref 36.0–46.0)
Hemoglobin: 12.6 g/dL (ref 12.0–15.0)
Potassium: 3.6 mmol/L (ref 3.5–5.1)
Sodium: 139 mmol/L (ref 135–145)
TCO2: 28 mmol/L (ref 22–32)

## 2017-12-31 LAB — RAPID URINE DRUG SCREEN, HOSP PERFORMED
Amphetamines: NOT DETECTED
BARBITURATES: NOT DETECTED
Benzodiazepines: NOT DETECTED
Cocaine: NOT DETECTED
Opiates: POSITIVE — AB
Tetrahydrocannabinol: NOT DETECTED

## 2017-12-31 LAB — POC URINE PREG, ED: Preg Test, Ur: NEGATIVE

## 2017-12-31 LAB — LIPASE, BLOOD: Lipase: 18 U/L (ref 11–51)

## 2017-12-31 LAB — CBG MONITORING, ED: Glucose-Capillary: 102 mg/dL — ABNORMAL HIGH (ref 70–99)

## 2017-12-31 MED ORDER — ONDANSETRON 4 MG PO TBDP
4.0000 mg | ORAL_TABLET | Freq: Once | ORAL | Status: AC
  Administered 2017-12-31: 4 mg via ORAL
  Filled 2017-12-31: qty 1

## 2017-12-31 MED ORDER — HALOPERIDOL LACTATE 5 MG/ML IJ SOLN: 2.0000 mg | Freq: Once | INTRAMUSCULAR | Status: DC

## 2017-12-31 MED ORDER — HALOPERIDOL LACTATE 5 MG/ML IJ SOLN
2.0000 mg | Freq: Once | INTRAMUSCULAR | Status: AC
  Administered 2017-12-31: 2 mg via INTRAMUSCULAR
  Filled 2017-12-31: qty 1

## 2017-12-31 MED ORDER — CEPHALEXIN 500 MG PO CAPS: 500.0000 mg | ORAL_CAPSULE | Freq: Three times a day (TID) | ORAL | 0 refills | Status: AC

## 2017-12-31 MED ORDER — DICYCLOMINE HCL 10 MG/ML IM SOLN: 20.0000 mg | Freq: Once | INTRAMUSCULAR | Status: DC

## 2017-12-31 MED ORDER — MORPHINE SULFATE (PF) 4 MG/ML IV SOLN
4.0000 mg | Freq: Once | INTRAVENOUS | Status: AC
  Administered 2017-12-31: 4 mg via INTRAVENOUS
  Filled 2017-12-31: qty 1

## 2017-12-31 MED ORDER — METOCLOPRAMIDE HCL 5 MG/ML IJ SOLN
10.0000 mg | Freq: Once | INTRAMUSCULAR | Status: AC
  Administered 2017-12-31: 10 mg via INTRAVENOUS
  Filled 2017-12-31: qty 2

## 2017-12-31 MED ORDER — SCOPOLAMINE 1 MG/3DAYS TD PT72
1.0000 | MEDICATED_PATCH | TRANSDERMAL | Status: DC
  Administered 2017-12-31: 1.5 mg via TRANSDERMAL
  Filled 2017-12-31 (×2): qty 1

## 2017-12-31 MED ORDER — SODIUM CHLORIDE 0.9 % IV BOLUS
2000.0000 mL | Freq: Once | INTRAVENOUS | Status: AC
  Administered 2017-12-31: 2000 mL via INTRAVENOUS

## 2017-12-31 MED ORDER — CAPSAICIN 0.025 % EX CREA
TOPICAL_CREAM | Freq: Once | CUTANEOUS | Status: DC
  Filled 2017-12-31: qty 60

## 2017-12-31 MED ORDER — DICYCLOMINE HCL 10 MG/ML IM SOLN
20.0000 mg | Freq: Once | INTRAMUSCULAR | Status: AC
  Administered 2017-12-31: 20 mg via INTRAMUSCULAR
  Filled 2017-12-31: qty 2

## 2017-12-31 MED ORDER — PROMETHAZINE HCL 25 MG/ML IJ SOLN
25.0000 mg | Freq: Once | INTRAMUSCULAR | Status: AC
  Administered 2017-12-31: 25 mg via INTRAVENOUS
  Filled 2017-12-31: qty 1

## 2017-12-31 MED ORDER — SODIUM CHLORIDE 0.9 % IV SOLN
Freq: Once | INTRAVENOUS | Status: AC
  Administered 2017-12-31: 21:00:00 via INTRAVENOUS

## 2017-12-31 NOTE — ED Provider Notes (Signed)
[facility] DEPT Provider Note   CSN: [id number] Arrival date & time: 12/31/17  1910     History   Chief Complaint Chief Complaint  Patient presents with  . Emesis    HPI [name] is a 27 y.o. female.  HPI  Patient is a 27 year old female with a history of gastroparesis, gallstones, GERD, hypertension, cyclic vomiting syndrome, pancreatitis, type 1 diabetes (w/ insulin pump), who presents the emergency department today for evaluation of abd pain, NV. Pt has had persistent diffuse abd pain. No focal area of pain. She states that her sxs are consistent with her prior diagnosis of gastroparesis.   Patient was seen earlier today in the ED.  Visit was reviewed.  Patient's labs were very reassuring with exception of slightly elevated blood glucose and UA that appeared to show signs of urinary tract infection.  Patient was started on Keflex.  She had improved after nausea medications and fluids and was able to tolerate p.o. in the ED.  She states after leaving the ED she went home and vomited 6 times.  She tried taking her regularly scheduled Zofran and Phenergan at home with no relief.  No hematemesis.  No diarrhea.  No fevers or chills.  No bilateral flank pain or reported urinary symptoms.  Pt has been seen multiple times for intractable NV this month including 2 admissions for this. On her most recent note from [facility] on 12/28/17 she was noted to have been discharged with a course of erythromycin which she had not filled. admitted 3 times this month for intractable NV. She was given referral to [facility] as well as [facility]. She was also given additional Rx for erythromycin.  Past Medical History:  Diagnosis Date  . Anxiety   . Arthritis    "hands, feet, knees" (12/18/2016)  . Asthma   . Diabetic gastroparesis ([place])    Per gastric emptying study 07/09/16 which showed significant delayed gastric emptying.  . Gallstones     . Gastroparesis   . GERD (gastroesophageal reflux disease)   . Heart murmur   . Hepatic steatosis 11/26/2014   and hepatomegaly  . Hypertension    hx (12/18/2016)  . Intractable cyclical vomiting syndrome    Archie Endo 12/18/2016  . Liver mass 11/26/2014  . Pancreatitis, acute 11/26/2014  . Pneumonia    "as a teen X 1" (12/18/2016)  . Type I diabetes mellitus ([place]) 2007   IDDM.  poorly controlled, multiple admits with DKA    Patient Active Problem List   Diagnosis Date Noted  . Diffuse pain   . Elevated blood pressure reading 12/03/2017  . Tachycardia 11/30/2017  . Intractable vomiting with nausea 11/15/2017  . Hypernatremia 11/02/2017  . Nausea and vomiting 01/22/2017  . Nausea & vomiting 01/21/2017  . Intractable cyclical vomiting syndrome 12/18/2016  . Leukocytosis 10/24/2016  . N&V (nausea and vomiting) 10/23/2016  . DKA, type 1 ([place]) 09/26/2016  . Thrombocytosis ([place]) 07/08/2016  . Candiduria, asymptomatic 06/23/2016  . Hyponatremia 06/13/2016  . Hematuria 05/13/2016  . UTI (urinary tract infection) 01/22/2016  . Diarrhea 11/09/2015  . Acute urinary retention   . Gastroparesis due to DM ([place]) 07/10/2015  . GERD (gastroesophageal reflux disease) 07/10/2015  . Depression with anxiety 07/10/2015  . Gastroparesis 06/22/2015  . Altered mental status 06/22/2015  . Volume depletion 06/10/2015  . Protein-calorie malnutrition, severe 06/10/2015  . Hyperglycemia   . Elevated bilirubin   . Hematemesis with nausea   . Intractable  nausea and vomiting 05/28/2015  . Abdominal pain in female   . Abdominal pain 05/24/2015  . Hypertension 05/24/2015  . Dehydration   . Diabetic gastroparesis ([place])   . Chronic diastolic heart failure ([place]) 04/11/2015  . Hematemesis 04/08/2015  . DKA (diabetic ketoacidoses) ([place]) 03/22/2015  . S/P laparoscopic cholecystectomy 02/11/2015  . Postextubation stridor   . Pancreatitis, acute 11/26/2014  . Volume overload 11/26/2014  . Hypokalemia  11/26/2014  . Hepatic steatosis 11/26/2014  . Liver mass 11/26/2014  . Sepsis ([place]) 11/25/2014  . Sinus tachycardia 11/25/2014  . Hypomagnesemia 11/25/2014  . Hypophosphatemia 11/25/2014  . Elevated LFTs 11/24/2014  . AKI (acute kidney injury) ([place]) 11/24/2014  . Migraine headache 11/24/2014  . Asthma 06/29/2012  . Uncontrolled type 1 diabetes mellitus ([place]) 06/19/2010  . Goiter, unspecified 06/19/2010    Past Surgical History:  Procedure Laterality Date  . CHOLECYSTECTOMY N/A 02/11/2015   Procedure: LAPAROSCOPIC CHOLECYSTECTOMY WITH INTRAOPERATIVE CHOLANGIOGRAM;  Surgeon: Greer Pickerel, MD;  Location: [facility] ORS;  Service: General;  Laterality: N/A;  . ESOPHAGOGASTRODUODENOSCOPY (EGD) WITH PROPOFOL Left 09/20/2014   Procedure: ESOPHAGOGASTRODUODENOSCOPY (EGD) WITH PROPOFOL;  Surgeon: Arta Silence, MD;  Location: [facility] ENDOSCOPY;  Service: Endoscopy;  Laterality: Left;  . WISDOM TOOTH EXTRACTION       OB History    Gravida  2   Para  1   Term  0   Preterm  1   AB  1   Living  1     SAB  0   TAB  1   Ectopic  0   Multiple  0   Live Births  1            Home Medications    Prior to Admission medications   Medication Sig Start Date End Date Taking? Authorizing Provider  albuterol (PROVENTIL HFA;VENTOLIN HFA) 108 (90 Base) MCG/ACT inhaler Inhale 1-2 puffs into the lungs every 6 (six) hours as needed for wheezing or shortness of breath.   Yes [provider]  blood glucose meter kit and supplies Dispense based on patient and insurance preference. Use up to four times daily as directed. (FOR ICD-10 E10.9, E11.9). 12/07/17  Yes Eugenie Filler, MD  erythromycin ethylsuccinate (EES) 200 MG/5ML suspension Take 5 mLs (200 mg total) by mouth every 8 (eight) hours. 12/27/17  Yes Sheikh, Omair Latif, DO  glucagon (GLUCAGON EMERGENCY) 1 MG injection Inject 1 mg into the muscle once as needed (For very low blood sugars. May repeat in 15-20 minutes if needed.). 12/20/16   Yes Hongalgi, Lenis Dickinson, MD  Insulin Pen Needle 31G X 5 MM MISC 30 Units by Does not apply route at bedtime. 12/07/17  Yes Eugenie Filler, MD  metoCLOPramide (REGLAN) 10 MG tablet Take 1 tablet (10 mg total) by mouth 3 (three) times daily with meals. Patient taking differently: Take 10 mg by mouth 3 (three) times daily as needed for nausea or vomiting.  11/07/17 11/07/18 Yes Debbe Odea, MD  ondansetron (ZOFRAN ODT) 4 MG disintegrating tablet Take 1 tablet (4 mg total) by mouth every 8 (eight) hours as needed for nausea or vomiting. 11/07/17  Yes Debbe Odea, MD  pantoprazole (PROTONIX) 40 MG tablet Take 1 tablet (40 mg total) by mouth daily. 12/21/17 01/20/18 Yes Mikhail, Velta Addison, DO  promethazine (PHENERGAN) 25 MG suppository Place 1 suppository (25 mg total) rectally every 6 (six) hours as needed for nausea or vomiting. 12/14/17  Yes Etta Quill, NP  scopolamine (TRANSDERM-SCOP) 1 MG/3DAYS Place 1 patch (1.5  mg total) onto the skin every 3 (three) days as needed (during episodes of vomiting). 11/21/17  Yes Debbe Odea, MD  cephALEXin (KEFLEX) 500 MG capsule Take 1 capsule (500 mg total) by mouth 3 (three) times daily for 7 days. 12/31/17 01/07/18  Jacqlyn Larsen, PA-C  dicyclomine (BENTYL) 10 MG capsule Take 1 capsule (10 mg total) by mouth 3 (three) times daily as needed for spasms. Patient not taking: Reported on 12/31/2017 12/07/17   Eugenie Filler, MD  famotidine (PEPCID) 20 MG tablet Take 1 tablet (20 mg total) by mouth 2 (two) times daily. Patient not taking: Reported on 12/31/2017 12/26/17   Raiford Noble Latif, DO  magnesium oxide (MAG-OX) 400 (241.3 Mg) MG tablet Take 1 tablet (400 mg total) by mouth 2 (two) times daily. Patient not taking: Reported on 12/31/2017 12/27/17   Kerney Elbe, DO    Family History Family History  Problem Relation Age of Onset  . Heart disease Maternal Grandmother   . Heart disease Maternal Grandfather   . Diabetes Mother   . Hyperlipidemia  Mother   . Hypertension Father   . Heart disease Father   . Hypertension Paternal Grandmother   . Cancer Paternal Grandfather     Social History Social History   Tobacco Use  . Smoking status: Never Smoker  . Smokeless tobacco: Never Used  Substance Use Topics  . Alcohol use: No  . Drug use: Yes    Types: Marijuana    Comment: 12/18/2016 q few days"     Allergies   Peanut-containing drug products; Food; and Ultram [tramadol]   Review of Systems Review of Systems  Constitutional: Negative for chills and fever.  HENT: Negative for ear pain and sore throat.   Eyes: Negative for pain and visual disturbance.  Respiratory: Negative for cough and shortness of breath.   Cardiovascular: Negative for chest pain and palpitations.  Gastrointestinal: Positive for abdominal pain, nausea and vomiting. Negative for constipation and diarrhea.  Genitourinary: Negative for dysuria, flank pain and hematuria.  Musculoskeletal: Negative for back pain.  Skin: Negative for rash.  Neurological: Negative for seizures and syncope.  All other systems reviewed and are negative.    Physical Exam Updated Vital Signs BP 100/68   Pulse (!) 102   Temp 99.6 F (37.6 C) (Rectal)   Resp 14   Ht _0  (1.626 m)   Wt 54.4 kg   LMP 12/06/2017 (Approximate)   SpO2 100%   BMI 20.60 kg/m   Physical Exam Vitals signs and nursing note reviewed.  Constitutional:      General: She is not in acute distress.    Appearance: She is well-developed.     Comments: Appears uncomfortable. No active vomiting during exam  HENT:     Head: Normocephalic and atraumatic.     Mouth/Throat:     Mouth: Mucous membranes are moist.  Eyes:     Conjunctiva/sclera: Conjunctivae normal.  Neck:     Musculoskeletal: Neck supple.  Cardiovascular:     Rate and Rhythm: Normal rate and regular rhythm.     Pulses: Normal pulses.     Heart sounds: No murmur.  Pulmonary:     Effort: Pulmonary effort is normal. No  respiratory distress.     Breath sounds: Normal breath sounds.  Abdominal:     General: Bowel sounds are normal. There is no distension.     Palpations: Abdomen is soft.     Tenderness: There is no abdominal tenderness. There is  no right CVA tenderness, left CVA tenderness, guarding or rebound.  Musculoskeletal: Normal range of motion.  Skin:    General: Skin is warm and dry.  Neurological:     Mental Status: She is alert.      ED Treatments / Results  Labs (all labs ordered are listed, but only abnormal results are displayed) Labs Reviewed  URINALYSIS, ROUTINE W REFLEX MICROSCOPIC - Abnormal; Notable for the following components:      Result Value   APPearance CLOUDY (*)    Glucose, UA >=500 (*)    Hgb urine dipstick LARGE (*)    Ketones, ur 20 (*)    Protein, ur >=300 (*)    Leukocytes, UA LARGE (*)    RBC / HPF >50 (*)    WBC, UA >50 (*)    Bacteria, UA RARE (*)    Non Squamous Epithelial 6-10 (*)    All other components within normal limits  RAPID URINE DRUG SCREEN, HOSP PERFORMED - Abnormal; Notable for the following components:   Opiates POSITIVE (*)    All other components within normal limits  I-STAT CHEM 8, ED - Abnormal; Notable for the following components:   Glucose, Bld 105 (*)    Calcium, Ion 1.13 (*)    All other components within normal limits  CBG MONITORING, ED - Abnormal; Notable for the following components:   Glucose-Capillary 102 (*)    All other components within normal limits    EKG None  Radiology No results found.  Procedures Procedures (including critical care time)  Medications Ordered in ED Medications  ondansetron (ZOFRAN-ODT) disintegrating tablet 4 mg (4 mg Oral Given 12/31/17 2033)  0.9 %  sodium chloride infusion ( Intravenous New Bag/Given 12/31/17 2125)  haloperidol lactate (HALDOL) injection 2 mg (2 mg Intramuscular Given 12/31/17 2033)     Initial Impression / Assessment and Plan / ED Course  I have reviewed the  triage vital signs and the nursing notes.  Pertinent labs & imaging results that were available during my care of the patient were reviewed by me and considered in my medical decision making (see chart for details).    Final Clinical Impressions(s) / ED Diagnoses   Final diagnoses:  Gastroparesis   Patient is a 27 year old female with a history of gastroparesis, gallstones s/p cholecystectomy, GERD, hypertension, cyclic vomiting syndrome, pancreatitis, type 1 diabetes (w/ insulin pump), who presents the emergency department today for evaluation of abd pain, NV. Pt has had persistent diffuse abd pain. No focal area of pain. She states that her sxs are consistent with her prior diagnosis of gastroparesis. Arrives tachycardic with otherwise normal vitals. No fevers.  Seen earlier today and had resolution of sxs. She was discharged with abx for UTI.  On arrival home she began vomiting again and was unable to stop vomiting despite her normal home meds.  No focal abd ttp. nonsurgical abdomen. No cva ttp.  Initial cbg reassuring istat chem 8 without significant hyperglycemia. Electrolytes wnl. Kidney function normal. No elevated anion gap UA with glucosuria, hematuria, ketonuria, proteinuria. Also with leukocytes, >50 rbc, >50wbc, rare bacteria, 21-50 squams (likely a contaminated specimen) UDS negative for marijuana ekg with sinus tach. No ischemic changes. No prolonged qt.  She was given haldol and zofran, in the ED.   9:13PM pt able to tolerate water. Re-eval of abdomen is benign.. she states nausea has resolved.  9:30 PM pt able to tolerate 3 packets of saltines.   9:53 PM reevaluated pt. She has not  vomited. She states sxs are improved. She states, "I think I'm ready to go now".  Pt able to tolerate fluids and crackers with improvement of symptoms and no evidence of vomiting in the ED. No signs of dka on lab work. Vital signs have improved. Advised her to continue her home medications and  return if worse. She voices understanding of the plan and reasons to return. All questions answered.  ED Discharge Orders    None       [place] [place] 12/31/17 2157    Tegeler, Gwenyth Allegra, MD 01/01/18 [phone]

## 2017-12-31 NOTE — ED Notes (Signed)
Unable to collect labs at this time.  I dont see any place to collect labs.

## 2017-12-31 NOTE — ED Notes (Signed)
Patient reports she took a few sips of the ginger ale, and while she did not throw up, "it made my stomach hurt again."

## 2017-12-31 NOTE — ED Notes (Signed)
IV team at bedside 

## 2017-12-31 NOTE — ED Triage Notes (Signed)
Pt reports abdominal pain and N/V associated with gastroparesis. Pt seen earlier today for same. A&Ox4. Ambulatory.

## 2017-12-31 NOTE — ED Triage Notes (Signed)
Pt to ED with c/o of abdominal pain and nausea. Pt states she has hx of gastroparesis but has not been throwing up. Pt is A&O x 4 and ambulatory.

## 2017-12-31 NOTE — Discharge Instructions (Signed)
You have a urinary tract infection please take antibiotics as directed, 10 using your nausea medications at home and start with small amounts of clear liquids frequently and advance your diet slowly as tolerated.  You need to follow-up with Dr. Paulita Fujita regarding her gastroparesis.  Return to the emergency department if you develop fevers, worsening abdominal pain, persistent vomiting and you are unable to take your antibiotics or any other new or concerning symptoms.

## 2017-12-31 NOTE — ED Notes (Signed)
Patient has very poor vasculature. This RN placed a 24guage PIV, but was unable to draw labs from the small vein. Ultrasound IV nurse Logan requested to attempt PIV placement. Dr. Darl Householder and [place] notified of patient care delay.

## 2017-12-31 NOTE — ED Notes (Signed)
Pt dressed into gown.  

## 2017-12-31 NOTE — Discharge Instructions (Signed)
Eat small frequent meals at home. Continue your home medications. Follow up with your gastroenterology specialist in 1 week.   Please return to the ER sooner if you have any new or worsening symptoms, or if you have any of the following symptoms:  Abdominal pain that does not go away.  You have a fever.  You keep throwing up (vomiting).  The pain is felt only in portions of the abdomen. Pain in the right side could possibly be appendicitis. In an adult, pain in the left lower portion of the abdomen could be colitis or diverticulitis.  You pass bloody or black tarry stools.  There is bright red blood in the stool.  The constipation stays for more than 4 days.  There is belly (abdominal) or rectal pain.  You do not seem to be getting better.  You have any questions or concerns.

## 2017-12-31 NOTE — ED Notes (Signed)
Patient given room temperature ginger ale for PO challenge. Will monitor results

## 2017-12-31 NOTE — ED Provider Notes (Signed)
[facility] DEPT Provider Note   CSN: [id number] Arrival date & time: 12/31/17  0023     History   Chief Complaint Chief Complaint  Patient presents with  . Abdominal Pain    HPI [name] is a 27 y.o. female.  [name] is a 27 y.o. female with a history of gastroparesis, cyclic vomiting, hypertension, diabetes, pancreatitis, presents to the emergency department for evaluation of abdominal pain, nausea and vomiting.  Symptoms started acutely last night.  She reports abdominal pain is generalized and she is felt very nauseated but has not thrown up this morning.  No hematemesis, no melena or hematochezia, normal bowel movements.  No fevers or chills.  She reports this feels like her usual gastroparesis, she denies any marijuana use in the last 3 months.  She was admitted for intractable nausea vomiting twice last week.  She has tried her home nausea medications without relief.  Denies any urinary frequency but has noted some burning with urination the past 2 days.  No vaginal discharge or vaginal bleeding.  No chest pain or shortness of breath.  No other aggravating or alleviating factors.     Past Medical History:  Diagnosis Date  . Anxiety   . Arthritis    "hands, feet, knees" (12/18/2016)  . Asthma   . Diabetic gastroparesis ([place])    Per gastric emptying study 07/09/16 which showed significant delayed gastric emptying.  . Gallstones   . Gastroparesis   . GERD (gastroesophageal reflux disease)   . Heart murmur   . Hepatic steatosis 11/26/2014   and hepatomegaly  . Hypertension    hx (12/18/2016)  . Intractable cyclical vomiting syndrome    Archie Endo 12/18/2016  . Liver mass 11/26/2014  . Pancreatitis, acute 11/26/2014  . Pneumonia    "as a teen X 1" (12/18/2016)  . Type I diabetes mellitus ([place]) 2007   IDDM.  poorly controlled, multiple admits with DKA    Patient Active Problem List   Diagnosis Date Noted  . Diffuse pain     . Elevated blood pressure reading 12/03/2017  . Tachycardia 11/30/2017  . Intractable vomiting with nausea 11/15/2017  . Hypernatremia 11/02/2017  . Nausea and vomiting 01/22/2017  . Nausea & vomiting 01/21/2017  . Intractable cyclical vomiting syndrome 12/18/2016  . Leukocytosis 10/24/2016  . N&V (nausea and vomiting) 10/23/2016  . DKA, type 1 ([place]) 09/26/2016  . Thrombocytosis ([place]) 07/08/2016  . Candiduria, asymptomatic 06/23/2016  . Hyponatremia 06/13/2016  . Hematuria 05/13/2016  . UTI (urinary tract infection) 01/22/2016  . Diarrhea 11/09/2015  . Acute urinary retention   . Gastroparesis due to DM ([place]) 07/10/2015  . GERD (gastroesophageal reflux disease) 07/10/2015  . Depression with anxiety 07/10/2015  . Gastroparesis 06/22/2015  . Altered mental status 06/22/2015  . Volume depletion 06/10/2015  . Protein-calorie malnutrition, severe 06/10/2015  . Hyperglycemia   . Elevated bilirubin   . Hematemesis with nausea   . Intractable nausea and vomiting 05/28/2015  . Abdominal pain in female   . Abdominal pain 05/24/2015  . Hypertension 05/24/2015  . Dehydration   . Diabetic gastroparesis ([place])   . Chronic diastolic heart failure ([place]) 04/11/2015  . Hematemesis 04/08/2015  . DKA (diabetic ketoacidoses) ([place]) 03/22/2015  . S/P laparoscopic cholecystectomy 02/11/2015  . Postextubation stridor   . Pancreatitis, acute 11/26/2014  . Volume overload 11/26/2014  . Hypokalemia 11/26/2014  . Hepatic steatosis 11/26/2014  . Liver mass 11/26/2014  . Sepsis ([place]) 11/25/2014  . Sinus tachycardia 11/25/2014  .  Hypomagnesemia 11/25/2014  . Hypophosphatemia 11/25/2014  . Elevated LFTs 11/24/2014  . AKI (acute kidney injury) ([place]) 11/24/2014  . Migraine headache 11/24/2014  . Asthma 06/29/2012  . Uncontrolled type 1 diabetes mellitus ([place]) 06/19/2010  . Goiter, unspecified 06/19/2010    Past Surgical History:  Procedure Laterality Date  . CHOLECYSTECTOMY N/A 02/11/2015    Procedure: LAPAROSCOPIC CHOLECYSTECTOMY WITH INTRAOPERATIVE CHOLANGIOGRAM;  Surgeon: Greer Pickerel, MD;  Location: [facility] ORS;  Service: General;  Laterality: N/A;  . ESOPHAGOGASTRODUODENOSCOPY (EGD) WITH PROPOFOL Left 09/20/2014   Procedure: ESOPHAGOGASTRODUODENOSCOPY (EGD) WITH PROPOFOL;  Surgeon: Arta Silence, MD;  Location[facility] END[facility];  Service: Endoscopy;  Laterality: Left;  . WISDOM TOOTH EXTRACTION       OB History    Gravida  2   Para  1   Term  0   Preterm  1   AB  1   Living  1     SAB  0   TAB  1   Ectopic  0   Multiple  0   Live Births  1            Home Medications    Prior to Admission medications   Medication Sig Start Date End Date Taking? Authorizing Provider  albuterol (PROVENTIL HFA;VENTOLIN HFA) 108 (90 Base) MCG/ACT inhaler Inhale 1-2 puffs into the lungs every 6 (six) hours as needed for wheezing or shortness of breath.    [provider]  blood glucose meter kit and supplies Dispense based on patient and insurance preference. Use up to four times daily as directed. (FOR ICD-10 E10.9, E11.9). 12/07/17   Eugenie Filler, MD  dicyclomine (BENTYL) 10 MG capsule Take 1 capsule (10 mg total) by mouth 3 (three) times daily as needed for spasms. 12/07/17   Eugenie Filler, MD  erythromycin ethylsuccinate (EES) 200 MG/5ML suspension Take 5 mLs (200 mg total) by mouth every 8 (eight) hours. 12/27/17   Raiford Noble Latif, DO  famotidine (PEPCID) 20 MG tablet Take 1 tablet (20 mg total) by mouth 2 (two) times daily. 12/26/17   Sheikh, Georgina Quint Latif, DO  glucagon (GLUCAGON EMERGENCY) 1 MG injection Inject 1 mg into the muscle once as needed (For very low blood sugars. May repeat in 15-20 minutes if needed.). 12/20/16   Hongalgi, Lenis Dickinson, MD  Insulin Pen Needle 31G X 5 MM MISC 30 Units by Does not apply route at bedtime. 12/07/17   Eugenie Filler, MD  magnesium oxide (MAG-OX) 400 (241.3 Mg) MG tablet Take 1 tablet (400 mg total) by mouth 2 (two)  times daily. 12/27/17   Raiford Noble Latif, DO  metoCLOPramide (REGLAN) 10 MG tablet Take 1 tablet (10 mg total) by mouth 3 (three) times daily with meals. Patient taking differently: Take 10 mg by mouth 3 (three) times daily as needed for nausea or vomiting.  11/07/17 11/07/18  Debbe Odea, MD  ondansetron (ZOFRAN ODT) 4 MG disintegrating tablet Take 1 tablet (4 mg total) by mouth every 8 (eight) hours as needed for nausea or vomiting. 11/07/17   Debbe Odea, MD  pantoprazole (PROTONIX) 40 MG tablet Take 1 tablet (40 mg total) by mouth daily. 12/21/17 01/20/18  Cristal Ford, DO  promethazine (PHENERGAN) 25 MG suppository Place 1 suppository (25 mg total) rectally every 6 (six) hours as needed for nausea or vomiting. 12/14/17   Etta Quill, NP  scopolamine (TRANSDERM-SCOP) 1 MG/3DAYS Place 1 patch (1.5 mg total) onto the skin every 3 (three) days as needed (during episodes of  vomiting). 11/21/17   Debbe Odea, MD    Family History Family History  Problem Relation Age of Onset  . Heart disease Maternal Grandmother   . Heart disease Maternal Grandfather   . Diabetes Mother   . Hyperlipidemia Mother   . Hypertension Father   . Heart disease Father   . Hypertension Paternal Grandmother   . Cancer Paternal Grandfather     Social History Social History   Tobacco Use  . Smoking status: Never Smoker  . Smokeless tobacco: Never Used  Substance Use Topics  . Alcohol use: No  . Drug use: Yes    Types: Marijuana    Comment: 12/18/2016 q few days"     Allergies   Peanut-containing drug products; Food; and Ultram [tramadol]   Review of Systems Review of Systems  Constitutional: Negative for chills and fever.  HENT: Negative for congestion, rhinorrhea and sore throat.   Respiratory: Negative for cough and shortness of breath.   Cardiovascular: Negative for chest pain.  Gastrointestinal: Positive for abdominal pain, nausea and vomiting. Negative for diarrhea.  Genitourinary:  Negative for dysuria and frequency.  Musculoskeletal: Negative for arthralgias and myalgias.  Skin: Negative for color change and rash.  Neurological: Negative for dizziness, syncope and light-headedness.     Physical Exam Updated Vital Signs BP 109/74 (BP Location: Right Arm)   Pulse 99   Temp 98.1 F (36.7 C) (Oral)   Resp 19   Ht 5' 4"  (1.626 m)   Wt 54 kg   LMP 12/06/2017 (Approximate)   SpO2 100%   BMI 20.43 kg/m   Physical Exam Vitals signs and nursing note reviewed.  Constitutional:      General: She is not in acute distress.    Appearance: She is well-developed. She is not diaphoretic.     Comments: Patient appears uncomfortable and mildly ill-appearing but is overall in no acute distress.  HENT:     Head: Normocephalic and atraumatic.     Mouth/Throat:     Mouth: Mucous membranes are moist.     Pharynx: Oropharynx is clear.  Eyes:     General:        Right eye: No discharge.        Left eye: No discharge.  Neck:     Musculoskeletal: Neck supple.  Cardiovascular:     Rate and Rhythm: Normal rate and regular rhythm.     Heart sounds: Normal heart sounds. No murmur. No friction rub. No gallop.   Pulmonary:     Effort: Pulmonary effort is normal. No respiratory distress.     Breath sounds: Normal breath sounds.     Comments: Respirations equal and unlabored, patient able to speak in full sentences, lungs clear to auscultation bilaterally Abdominal:     General: Abdomen is flat. Bowel sounds are normal. There is no distension.     Palpations: Abdomen is soft.     Tenderness: There is no abdominal tenderness. There is no guarding.  Skin:    General: Skin is warm and dry.     Capillary Refill: Capillary refill takes less than 2 seconds.  Neurological:     Mental Status: She is alert and oriented to person, place, and time.     Coordination: Coordination normal.  Psychiatric:        Mood and Affect: Mood normal.        Behavior: Behavior normal.      ED  Treatments / Results  Labs (all labs ordered are listed, but  only abnormal results are displayed) Labs Reviewed  COMPREHENSIVE METABOLIC PANEL - Abnormal; Notable for the following components:      Result Value   Glucose, Bld 226 (*)    AST 13 (*)    All other components within normal limits  URINALYSIS, ROUTINE W REFLEX MICROSCOPIC - Abnormal; Notable for the following components:   APPearance CLOUDY (*)    Glucose, UA 50 (*)    Hgb urine dipstick MODERATE (*)    Ketones, ur 20 (*)    Protein, ur 100 (*)    Leukocytes, UA MODERATE (*)    RBC / HPF >50 (*)    Bacteria, UA FEW (*)    All other components within normal limits  LIPASE, BLOOD  CBC  I-STAT BETA HCG BLOOD, ED ([facility], [facility], [facility] ONLY)  POC URINE PREG, ED    EKG EKG Interpretation  Date/Time:  Friday December 31 2017 07:54:50 EST Ventricular Rate:  108 PR Interval:    QRS Duration: 85 QT Interval:  325 QTC Calculation: 436 R Axis:   78 Text Interpretation:  Sinus tachycardia Consider right atrial enlargement No acute changes Nonspecific ST and T wave abnormality Confirmed by Varney Biles ([id number]) on 12/31/2017 7:58:07 AM   Radiology No results found.  Procedures Procedures (including critical care time)  Medications Ordered in ED Medications  sodium chloride 0.9 % bolus 2,000 mL (0 mLs Intravenous Stopped 12/31/17 1125)  promethazine (PHENERGAN) injection 25 mg (25 mg Intravenous Given 12/31/17 0739)  metoCLOPramide (REGLAN) injection 10 mg (10 mg Intravenous Given 12/31/17 0742)  dicyclomine (BENTYL) injection 20 mg (20 mg Intramuscular Given 12/31/17 0722)  morphine 4 MG/ML injection 4 mg (4 mg Intravenous Given 12/31/17 0742)     Initial Impression / Assessment and Plan / ED Course  I have reviewed the triage vital signs and the nursing notes.  Pertinent labs & imaging results that were available during my care of the patient were reviewed by me and considered in my medical decision making (see chart  for details).  Patient presents with nausea and generalized abdominal pain, symptoms seem consistent with her prior gastroparesis, she has been seen numerous times for the same and was admitted to the hospital twice last week.  Symptoms started last night and have been persistent and worsening.  She denies associated fevers, no localized tenderness on abdominal exam but generalized pain.  She reports nausea but has not had any vomiting since arriving in the emergency department.  Will check abdominal labs and urinalysis.  Will give 2 L of fluid, Reglan, Phenergan and a scopolamine patch, as well as dose of morphine for pain and then reevaluate patient.  Based on patient's abdominal exam do not feel any imaging is indicated at this time, no concern for acute surgical abdomen.  Patient with difficult IV access, IV attained under ultrasound guidance by Dr. Darl Householder.  Labs overall very reassuring, no leukocytosis, normal hemoglobin aside from glucose of 226, no acute electrolyte derangements, normal renal and liver function normal lipase.  Negative pregnancy.  Urinalysis does show signs of infection with moderate leukocytes, RBCs, WBCs and few bacteria, will treat with Keflex for urinary tract infection.  On reevaluation after medications patient is feeling much better, she reports improvement in her pain and has been tolerating fluids with no further vomiting.  At this time patient is stable for discharge home, she reports she has plenty of her nausea medication at home, provided prescription for Keflex and discussed appropriate return precautions.  Patient expresses  understanding and is in agreement with plan.  Final Clinical Impressions(s) / ED Diagnoses   Final diagnoses:  Non-intractable vomiting with nausea, unspecified vomiting type  Generalized abdominal pain  Cystitis    ED Discharge Orders         Ordered    cephALEXin (KEFLEX) 500 MG capsule  3 times daily     12/31/17 1112             Benedetto Goad [place], [place] 12/31/17 1648    Drenda Freeze, MD 01/02/18 [id number]

## 2018-01-05 ENCOUNTER — Other Ambulatory Visit: Payer: Self-pay

## 2018-01-05 ENCOUNTER — Encounter (HOSPITAL_COMMUNITY): Payer: Self-pay | Admitting: *Deleted

## 2018-01-05 ENCOUNTER — Emergency Department (HOSPITAL_COMMUNITY)
Admission: EM | Admit: 2018-01-05 | Discharge: 2018-01-05 | Disposition: A | Discharge status: Home / Self Care | Payer: Medicaid Other | Attending: Emergency Medicine | Admitting: Emergency Medicine

## 2018-01-05 DIAGNOSIS — Z9641 Presence of insulin pump (external) (internal): Secondary | ICD-10-CM | POA: Insufficient documentation

## 2018-01-05 DIAGNOSIS — I110 Hypertensive heart disease with heart failure: Secondary | ICD-10-CM | POA: Diagnosis not present

## 2018-01-05 DIAGNOSIS — R000 Tachycardia, unspecified: Secondary | ICD-10-CM | POA: Insufficient documentation

## 2018-01-05 DIAGNOSIS — I5032 Chronic diastolic (congestive) heart failure: Secondary | ICD-10-CM | POA: Insufficient documentation

## 2018-01-05 DIAGNOSIS — E10649 Type 1 diabetes mellitus with hypoglycemia without coma: Secondary | ICD-10-CM | POA: Insufficient documentation

## 2018-01-05 DIAGNOSIS — Z79899 Other long term (current) drug therapy: Secondary | ICD-10-CM | POA: Diagnosis not present

## 2018-01-05 DIAGNOSIS — K3184 Gastroparesis: Secondary | ICD-10-CM

## 2018-01-05 DIAGNOSIS — E1143 Type 2 diabetes mellitus with diabetic autonomic (poly)neuropathy: Secondary | ICD-10-CM

## 2018-01-05 DIAGNOSIS — E1043 Type 1 diabetes mellitus with diabetic autonomic (poly)neuropathy: Secondary | ICD-10-CM | POA: Diagnosis not present

## 2018-01-05 DIAGNOSIS — E162 Hypoglycemia, unspecified: Secondary | ICD-10-CM

## 2018-01-05 DIAGNOSIS — R1084 Generalized abdominal pain: Secondary | ICD-10-CM | POA: Diagnosis present

## 2018-01-05 DIAGNOSIS — J45909 Unspecified asthma, uncomplicated: Secondary | ICD-10-CM | POA: Insufficient documentation

## 2018-01-05 LAB — COMPREHENSIVE METABOLIC PANEL
ALBUMIN: 4.2 g/dL (ref 3.5–5.0)
ALT: 12 U/L (ref 0–44)
AST: 18 U/L (ref 15–41)
Alkaline Phosphatase: 73 U/L (ref 38–126)
Anion gap: 14 (ref 5–15)
BILIRUBIN TOTAL: 0.8 mg/dL (ref 0.3–1.2)
BUN: 16 mg/dL (ref 6–20)
CO2: 24 mmol/L (ref 22–32)
Calcium: 9.6 mg/dL (ref 8.9–10.3)
Chloride: 102 mmol/L (ref 98–111)
Creatinine, Ser: 0.9 mg/dL (ref 0.44–1.00)
GFR calc Af Amer: >60 mL/min (ref >60)
GFR calc non Af Amer: >60 mL/min (ref >60)
Glucose, Bld: 66 mg/dL — ABNORMAL LOW (ref 70–99)
POTASSIUM: 3.8 mmol/L (ref 3.5–5.1)
Sodium: 140 mmol/L (ref 135–145)
Total Protein: 7.4 g/dL (ref 6.5–8.1)

## 2018-01-05 LAB — CBC WITH DIFFERENTIAL/PLATELET
Abs Immature Granulocytes: 0.01 10*3/uL (ref 0.00–0.07)
Basophils Absolute: 0.1 10*3/uL (ref 0.0–0.1)
Basophils Relative: 1 %
Eosinophils Absolute: 0.2 10*3/uL (ref 0.0–0.5)
Eosinophils Relative: 3 %
HCT: 46.6 % — ABNORMAL HIGH (ref 36.0–46.0)
Hemoglobin: 14.3 g/dL (ref 12.0–15.0)
Immature Granulocytes: 0 %
Lymphocytes Relative: 46 %
Lymphs Abs: 3.4 10*3/uL (ref 0.7–4.0)
MCH: 28 pg (ref 26.0–34.0)
MCHC: 30.7 g/dL (ref 30.0–36.0)
MCV: 91.2 fL (ref 80.0–100.0)
Monocytes Absolute: 0.5 10*3/uL (ref 0.1–1.0)
Monocytes Relative: 7 %
NRBC: 0 % (ref 0.0–0.2)
Neutro Abs: 3.1 10*3/uL (ref 1.7–7.7)
Neutrophils Relative %: 43 %
Platelets: 452 10*3/uL — ABNORMAL HIGH (ref 150–400)
RBC: 5.11 MIL/uL (ref 3.87–5.11)
RDW: 13.4 % (ref 11.5–15.5)
WBC: 7.2 10*3/uL (ref 4.0–10.5)

## 2018-01-05 LAB — LIPASE, BLOOD: Lipase: 17 U/L (ref 11–51)

## 2018-01-05 LAB — CBG MONITORING, ED
Glucose-Capillary: 33 mg/dL — CL (ref 70–99)
Glucose-Capillary: 143 mg/dL — ABNORMAL HIGH (ref 70–99)
Glucose-Capillary: 249 mg/dL — ABNORMAL HIGH (ref 70–99)

## 2018-01-05 LAB — I-STAT BETA HCG BLOOD, ED (MC, WL, AP ONLY): I-stat hCG, quantitative: <5 m[IU]/mL (ref <5)

## 2018-01-05 MED ORDER — DEXTROSE 50 % IV SOLN
INTRAVENOUS | Status: AC
  Filled 2018-01-05: qty 50

## 2018-01-05 MED ORDER — MORPHINE SULFATE (PF) 4 MG/ML IV SOLN
4.0000 mg | Freq: Once | INTRAVENOUS | Status: AC
  Administered 2018-01-05: 4 mg via INTRAVENOUS
  Filled 2018-01-05: qty 1

## 2018-01-05 MED ORDER — DEXTROSE 50 % IV SOLN
1.0000 | Freq: Once | INTRAVENOUS | Status: AC
  Administered 2018-01-05: 50 mL via INTRAVENOUS

## 2018-01-05 MED ORDER — HALOPERIDOL LACTATE 5 MG/ML IJ SOLN
5.0000 mg | Freq: Once | INTRAMUSCULAR | Status: AC
  Administered 2018-01-05: 5 mg via INTRAVENOUS
  Filled 2018-01-05: qty 1

## 2018-01-05 MED ORDER — SODIUM CHLORIDE 0.9 % IV BOLUS
1000.0000 mL | Freq: Once | INTRAVENOUS | Status: AC
  Administered 2018-01-05: 1000 mL via INTRAVENOUS

## 2018-01-05 NOTE — Discharge Instructions (Signed)
Please read and follow all provided instructions.  Your diagnoses today include:  1. Diabetic gastroparesis ([place])   2. Hypoglycemia     Tests performed today include:  Blood counts and electrolytes - shows low blood sugar but now improved  Blood tests to check liver and kidney function  Blood tests to check pancreas function  Urine test to look for infection and pregnancy  Vital signs. See below for your results today.   Medications prescribed:   None  Take any prescribed medications only as directed.  Home care instructions:   Follow any educational materials contained in this packet.   Use home medications to help control your symptoms  Check your blood sugars frequently, especially when you are unable to eat, to make sure it is not dropping too low or going to high  Drink clear liquids for the next 24 hours and introduce solid foods slowly after 24 hours using the b.r.a.t. diet (Bananas, Rice, Applesauce, Toast, Yogurt).    Follow-up instructions: Please follow-up with your primary care provider in the next 2 days for further evaluation of your symptoms. If you are not feeling better in 48 hours you may have a condition that is more serious and you need re-evaluation.   Return instructions:  SEEK IMMEDIATE MEDICAL ATTENTION IF:  If you have pain that does not go away or becomes severe   A temperature above 101F develops   Repeated vomiting occurs (multiple episodes)   If you have pain that becomes localized to portions of the abdomen. The right side could possibly be appendicitis. In an adult, the left lower portion of the abdomen could be colitis or diverticulitis.   Blood is being passed in stools or vomit (bright red or black tarry stools)   You develop chest pain, difficulty breathing, dizziness or fainting, or become confused, poorly responsive, or inconsolable (young children)  If you have any other emergent concerns regarding your health  Additional  Information: Abdominal (belly) pain can be caused by many things. Your caregiver performed an examination and possibly ordered blood/urine tests and imaging (CT scan, x-rays, ultrasound). Many cases can be observed and treated at home after initial evaluation in the emergency department. Even though you are being discharged home, abdominal pain can be unpredictable. Therefore, you need a repeated exam if your pain does not resolve, returns, or worsens. Most patients with abdominal pain don't have to be admitted to the hospital or have surgery, but serious problems like appendicitis and gallbladder attacks can start out as nonspecific pain. Many abdominal conditions cannot be diagnosed in one visit, so follow-up evaluations are very important.  Your vital signs today were: BP 97/60 (BP Location: Left Arm)    Pulse (!) 109    Temp 98.6 F (37 C)    Resp 17    LMP 12/06/2017 (Approximate)    SpO2 100%  If your blood pressure (bp) was elevated above 135/85 this visit, please have this repeated by your doctor within one month. --------------

## 2018-01-05 NOTE — ED Notes (Signed)
Lab at bedside

## 2018-01-05 NOTE — ED Notes (Signed)
Unable to obtain blood. Lab phlebotomy contacted.

## 2018-01-05 NOTE — ED Notes (Signed)
Made Josh PA aware that patient CBG 33. Verbal order for Dextrose 50% given.  Patient also given juice.

## 2018-01-05 NOTE — ED Provider Notes (Signed)
[facility] DEPT Provider Note   CSN: [id number] Arrival date & time: 01/05/18  0457     History   Chief Complaint Chief Complaint  Patient presents with  . Abdominal Pain    HPI Phoenix Kruczek is a 27 y.o. female.  Patient with history of diabetes with DKA, multiple ED visits and admissions for gastroparesis, history of pancreatitis --presents with acute onset of abdominal pain and vomiting this morning consistent with previous episodes of gastroparesis.  Patient took her home Reglan and Zofran without improvement.  Pain is generalized. No fever or URI sx. Multiple episodes of vomiting. No diarrhea or urine changes. The onset of this condition was acute. The course is constant. Aggravating factors: none. Alleviating factors: none.       Past Medical History:  Diagnosis Date  . Anxiety   . Arthritis    "hands, feet, knees" (12/18/2016)  . Asthma   . Diabetic gastroparesis ([place])    Per gastric emptying study 07/09/16 which showed significant delayed gastric emptying.  . Gallstones   . Gastroparesis   . GERD (gastroesophageal reflux disease)   . Heart murmur   . Hepatic steatosis 11/26/2014   and hepatomegaly  . Hypertension    hx (12/18/2016)  . Intractable cyclical vomiting syndrome    Archie Endo 12/18/2016  . Liver mass 11/26/2014  . Pancreatitis, acute 11/26/2014  . Pneumonia    "as a teen X 1" (12/18/2016)  . Type I diabetes mellitus ([place]) 2007   IDDM.  poorly controlled, multiple admits with DKA    Patient Active Problem List   Diagnosis Date Noted  . Diffuse pain   . Elevated blood pressure reading 12/03/2017  . Tachycardia 11/30/2017  . Intractable vomiting with nausea 11/15/2017  . Hypernatremia 11/02/2017  . Nausea and vomiting 01/22/2017  . Nausea & vomiting 01/21/2017  . Intractable cyclical vomiting syndrome 12/18/2016  . Leukocytosis 10/24/2016  . N&V (nausea and vomiting) 10/23/2016  . DKA, type 1 ([place]) 09/26/2016    . Thrombocytosis ([place]) 07/08/2016  . Candiduria, asymptomatic 06/23/2016  . Hyponatremia 06/13/2016  . Hematuria 05/13/2016  . UTI (urinary tract infection) 01/22/2016  . Diarrhea 11/09/2015  . Acute urinary retention   . Gastroparesis due to DM ([place]) 07/10/2015  . GERD (gastroesophageal reflux disease) 07/10/2015  . Depression with anxiety 07/10/2015  . Gastroparesis 06/22/2015  . Altered mental status 06/22/2015  . Volume depletion 06/10/2015  . Protein-calorie malnutrition, severe 06/10/2015  . Hyperglycemia   . Elevated bilirubin   . Hematemesis with nausea   . Intractable nausea and vomiting 05/28/2015  . Abdominal pain in female   . Abdominal pain 05/24/2015  . Hypertension 05/24/2015  . Dehydration   . Diabetic gastroparesis ([place])   . Chronic diastolic heart failure ([place]) 04/11/2015  . Hematemesis 04/08/2015  . DKA (diabetic ketoacidoses) ([place]) 03/22/2015  . S/P laparoscopic cholecystectomy 02/11/2015  . Postextubation stridor   . Pancreatitis, acute 11/26/2014  . Volume overload 11/26/2014  . Hypokalemia 11/26/2014  . Hepatic steatosis 11/26/2014  . Liver mass 11/26/2014  . Sepsis ([place]) 11/25/2014  . Sinus tachycardia 11/25/2014  . Hypomagnesemia 11/25/2014  . Hypophosphatemia 11/25/2014  . Elevated LFTs 11/24/2014  . AKI (acute kidney injury) ([place]) 11/24/2014  . Migraine headache 11/24/2014  . Asthma 06/29/2012  . Uncontrolled type 1 diabetes mellitus ([place]) 06/19/2010  . Goiter, unspecified 06/19/2010    Past Surgical History:  Procedure Laterality Date  . CHOLECYSTECTOMY N/A 02/11/2015   Procedure: LAPAROSCOPIC CHOLECYSTECTOMY WITH INTRAOPERATIVE CHOLANGIOGRAM;  Surgeon: Greer Pickerel, MD;  Location: [facility];  Service: General;  Laterality: N/A;  . ESOPHAGOGASTRODUODENOSCOPY (EGD) WITH PROPOFOL Left 09/20/2014   Procedure: ESOPHAGOGASTRODUODENOSCOPY (EGD) WITH PROPOFOL;  Surgeon: Arta Silence, MD;  Location: [facility] END[facility]COPY;  Service: Endoscopy;  Laterality:  Left;  . WISDOM TOOTH EXTRACTION       OB History    Gravida  2   Para  1   Term  0   Preterm  1   AB  1   Living  1     SAB  0   TAB  1   Ectopic  0   Multiple  0   Live Births  1            Home Medications    Prior to Admission medications   Medication Sig Start Date End Date Taking? Authorizing Provider  erythromycin ethylsuccinate (EES) 200 MG/5ML suspension Take 5 mLs (200 mg total) by mouth every 8 (eight) hours. 12/27/17  Yes Sheikh, Omair Latif, DO  Insulin Human (INSULIN PUMP) SOLN Inject 1 each into the skin continuous. Novolog insulin. Pt puts in her insulin pump. Car ratio 1-10, insulin sensitive 66. 1.10 from 12 to 12. Every 3 days needs to be replaced. Pt needs to replace today.   Yes [provider]  metoCLOPramide (REGLAN) 10 MG tablet Take 1 tablet (10 mg total) by mouth 3 (three) times daily with meals. Patient taking differently: Take 10 mg by mouth 3 (three) times daily as needed for nausea or vomiting.  11/07/17 11/07/18 Yes Debbe Odea, MD  ondansetron (ZOFRAN ODT) 4 MG disintegrating tablet Take 1 tablet (4 mg total) by mouth every 8 (eight) hours as needed for nausea or vomiting. 11/07/17  Yes Debbe Odea, MD  pantoprazole (PROTONIX) 40 MG tablet Take 1 tablet (40 mg total) by mouth daily. 12/21/17 01/20/18 Yes Mikhail, Velta Addison, DO  promethazine (PHENERGAN) 25 MG suppository Place 1 suppository (25 mg total) rectally every 6 (six) hours as needed for nausea or vomiting. 12/14/17  Yes Etta Quill, NP  scopolamine (TRANSDERM-SCOP) 1 MG/3DAYS Place 1 patch (1.5 mg total) onto the skin every 3 (three) days as needed (during episodes of vomiting). 11/21/17  Yes Debbe Odea, MD  albuterol (PROVENTIL HFA;VENTOLIN HFA) 108 (90 Base) MCG/ACT inhaler Inhale 1-2 puffs into the lungs every 6 (six) hours as needed for wheezing or shortness of breath.    [provider]  blood glucose meter kit and supplies Dispense based on patient and  insurance preference. Use up to four times daily as directed. (FOR ICD-10 E10.9, E11.9). 12/07/17   Eugenie Filler, MD  cephALEXin (KEFLEX) 500 MG capsule Take 1 capsule (500 mg total) by mouth 3 (three) times daily for 7 days. 12/31/17 01/07/18  Jacqlyn Larsen, PA-C  dicyclomine (BENTYL) 10 MG capsule Take 1 capsule (10 mg total) by mouth 3 (three) times daily as needed for spasms. Patient not taking: Reported on 12/31/2017 12/07/17   Eugenie Filler, MD  famotidine (PEPCID) 20 MG tablet Take 1 tablet (20 mg total) by mouth 2 (two) times daily. Patient not taking: Reported on 12/31/2017 12/26/17   Raiford Noble Latif, DO  glucagon (GLUCAGON EMERGENCY) 1 MG injection Inject 1 mg into the muscle once as needed (For very low blood sugars. May repeat in 15-20 minutes if needed.). 12/20/16   Hongalgi, Lenis Dickinson, MD  Insulin Pen Needle 31G X 5 MM MISC 30 Units by Does not apply route at bedtime. 12/07/17   [place] Silos,  Malachy Moan, MD  magnesium oxide (MAG-OX) 400 (241.3 Mg) MG tablet Take 1 tablet (400 mg total) by mouth 2 (two) times daily. Patient not taking: Reported on 12/31/2017 12/27/17   Kerney Elbe, DO    Family History Family History  Problem Relation Age of Onset  . Heart disease Maternal Grandmother   . Heart disease Maternal Grandfather   . Diabetes Mother   . Hyperlipidemia Mother   . Hypertension Father   . Heart disease Father   . Hypertension Paternal Grandmother   . Cancer Paternal Grandfather     Social History Social History   Tobacco Use  . Smoking status: Never Smoker  . Smokeless tobacco: Never Used  Substance Use Topics  . Alcohol use: No  . Drug use: Yes    Types: Marijuana    Comment: 12/18/2016 q few days"     Allergies   Peanut-containing drug products; Food; and Ultram [tramadol]   Review of Systems Review of Systems  Constitutional: Negative for fever.  HENT: Negative for rhinorrhea and sore throat.   Eyes: Negative for redness.    Respiratory: Negative for cough.   Cardiovascular: Negative for chest pain.  Gastrointestinal: Positive for abdominal pain, nausea and vomiting. Negative for diarrhea.  Genitourinary: Negative for dysuria.  Musculoskeletal: Negative for myalgias.  Skin: Negative for rash.  Neurological: Negative for headaches.     Physical Exam Updated Vital Signs BP 110/79   Pulse (!) 131   Temp 98.6 F (37 C)   Resp 16   LMP 12/06/2017 (Approximate)   SpO2 98%   Physical Exam Vitals signs and nursing note reviewed.  Constitutional:      General: She is in acute distress (uncomfortable appearing).     Appearance: She is well-developed.  HENT:     Head: Normocephalic and atraumatic.  Eyes:     General:        Right eye: No discharge.        Left eye: No discharge.     Conjunctiva/sclera: Conjunctivae normal.  Neck:     Musculoskeletal: Normal range of motion and neck supple.  Cardiovascular:     Rate and Rhythm: Regular rhythm. Tachycardia present.     Heart sounds: Normal heart sounds.  Pulmonary:     Effort: Pulmonary effort is normal.     Breath sounds: Normal breath sounds.  Abdominal:     Palpations: Abdomen is soft.     Tenderness: There is generalized abdominal tenderness. There is no right CVA tenderness, left CVA tenderness, guarding or rebound. Negative signs include Murphy's sign.  Skin:    General: Skin is warm and dry.  Neurological:     Mental Status: She is alert.      ED Treatments / Results  Labs (all labs ordered are listed, but only abnormal results are displayed) Labs Reviewed  CBC WITH DIFFERENTIAL/PLATELET - Abnormal; Notable for the following components:      Result Value   HCT 46.6 (*)    Platelets 452 (*)    All other components within normal limits  COMPREHENSIVE METABOLIC PANEL - Abnormal; Notable for the following components:   Glucose, Bld 66 (*)    All other components within normal limits  CBG MONITORING, ED - Abnormal; Notable for the  following components:   Glucose-Capillary 33 (*)    All other components within normal limits  CBG MONITORING, ED - Abnormal; Notable for the following components:   Glucose-Capillary 143 (*)    All other components  within normal limits  LIPASE, BLOOD  URINALYSIS, ROUTINE W REFLEX MICROSCOPIC  RAPID URINE DRUG SCREEN, HOSP PERFORMED  I-STAT BETA HCG BLOOD, ED ([facility], [facility], [facility] ONLY)    EKG EKG Interpretation  Date/Time:  Wednesday January 05 2018 06:32:50 EST Ventricular Rate:  120 PR Interval:    QRS Duration: 86 QT Interval:  314 QTC Calculation: 444 R Axis:   81 Text Interpretation:  Sinus tachycardia LAE, consider biatrial enlargement When compared with ECG of 12/31/2017, No significant change was found Confirmed by Delora Fuel ([id number]) on 01/05/2018 6:36:07 AM   Radiology No results found.  Procedures Procedures (including critical care time)  Medications Ordered in ED Medications  dextrose 50 % solution (has no administration in time range)  haloperidol lactate (HALDOL) injection 5 mg (5 mg Intravenous Given 01/05/18 0646)  morphine 4 MG/ML injection 4 mg (4 mg Intravenous Given 01/05/18 0856)  sodium chloride 0.9 % bolus 1,000 mL (1,000 mLs Intravenous New Bag/Given 01/05/18 0919)  dextrose 50 % solution 50 mL (50 mLs Intravenous Given 01/05/18 0929)     Initial Impression / Assessment and Plan / ED Course  I have reviewed the triage vital signs and the nursing notes.  Pertinent labs & imaging results that were available during my care of the patient were reviewed by me and considered in my medical decision making (see chart for details).     Patient seen and examined. Work-up initiated. Medications ordered.   Vital signs reviewed and are as follows: BP 110/79   Pulse (!) 131   Temp 98.6 F (37 C)   Resp 16   LMP 12/06/2017 (Approximate)   SpO2 98%   US guided IV obtained by RN. Normal QTc on EKG.   8:53 AM patient reexamined.  States that nausea is  somewhat better.  Still complaining of pain.  Morphine ordered.  9:28 AM CBG low -- pt has insulin pump. D50 ordered. Vitals improving with treatment.   BP 97/60 (BP Location: Left Arm)   Pulse (!) 109   Temp 98.6 F (37 C)   Resp 17   LMP 12/06/2017 (Approximate)   SpO2 100%    9:55 AM CBG improved.   11:30 AM pain is controlled.  Patient is drinking in the room.  She is comfortable with discharged home at this time.  Patient states that she has Phenergan and Zofran at home.  Advised use of Tylenol for pain control.  Encouraged clear liquids for at least the next 24 hours.  Encouraged PCP follow-up in 2 days for recheck.   Final Clinical Impressions(s) / ED Diagnoses   Final diagnoses:  Diabetic gastroparesis ([place])  Hypoglycemia   Patient with abdominal pain and vomiting consistent with previous episodes of gastroparesis.  Patient's blood sugar was initially low and was treated with dextrose with improvement.  No signs of DKA.  White blood cell count is normal.  No focal tenderness on exam.  Symptoms are improved in the ED with fluids, antiemetics, pain medication.  She is ready for discharge home at this point.  ED Discharge Orders    None       Carlisle Cater, Hershal Coria 37/48/27 0786    Delora Fuel, MD 75/44/92 [phone]

## 2018-01-05 NOTE — ED Triage Notes (Signed)
Patient reports that she is having n/v and abdominal pain d/t her gastroparesis.

## 2018-05-29 IMAGING — DX DG CHEST 1V PORT
1 series · 1 of 1 positions shown · non-contrast
Comparison: 02/20/2016

CLINICAL DATA: Leukocytosis nausea and vomiting

EXAM:
PORTABLE CHEST 1 VIEW

[chest ap]
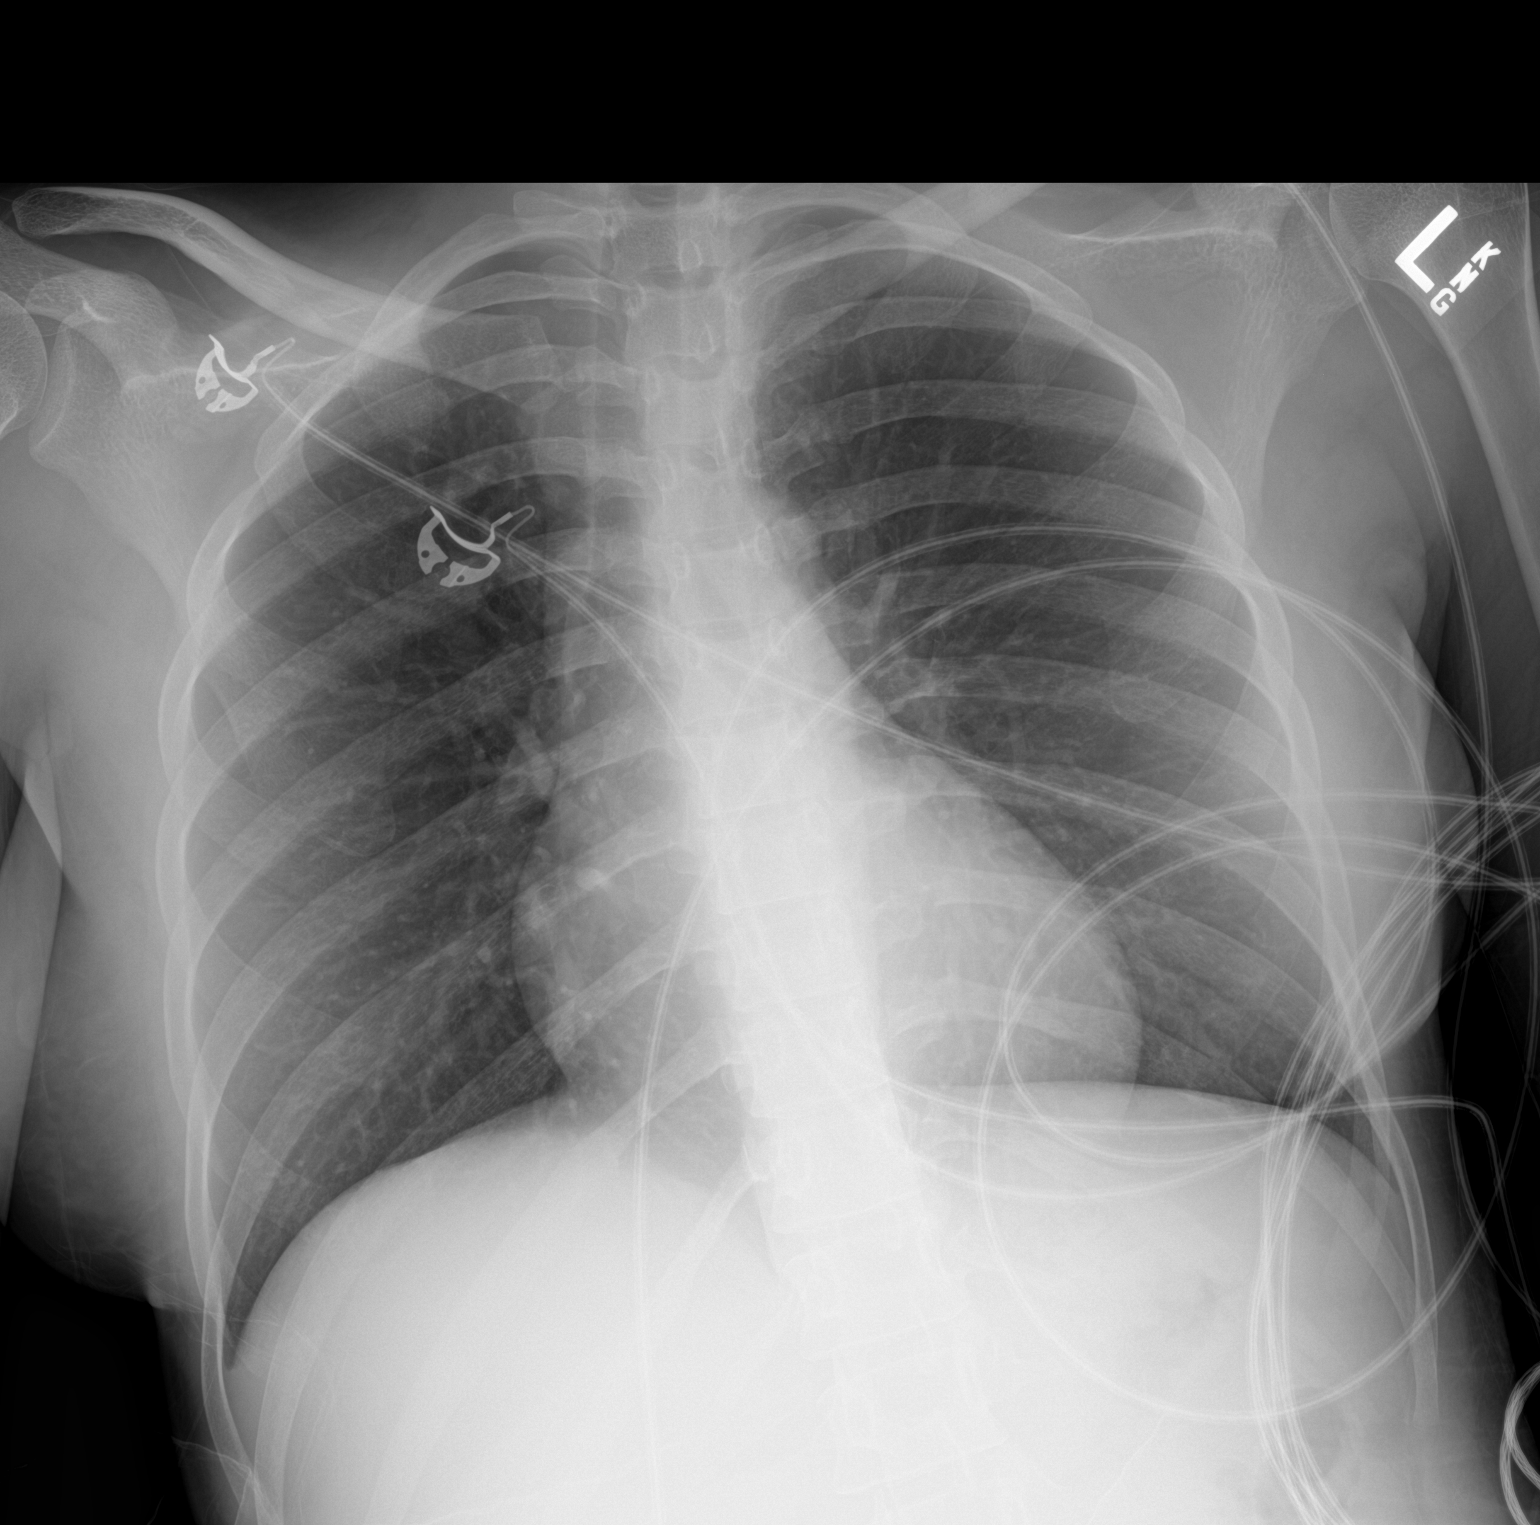

[1 of 1 positions shown; findings below may reference images not displayed]

FINDINGS: The heart size and mediastinal contours are within normal limits.
Both lungs are clear. The visualized skeletal structures are
unremarkable.
IMPRESSION: No active disease.

## 2018-10-03 ENCOUNTER — Emergency Department (HOSPITAL_COMMUNITY)
Admission: EM | Admit: 2018-10-03 | Discharge: 2018-10-04 | Disposition: A | Discharge status: Home / Self Care | Payer: Medicaid Other | Source: Home / Self Care | Attending: Emergency Medicine | Admitting: Emergency Medicine

## 2018-10-03 ENCOUNTER — Encounter (HOSPITAL_COMMUNITY): Payer: Self-pay

## 2018-10-03 ENCOUNTER — Other Ambulatory Visit: Payer: Self-pay

## 2018-10-03 DIAGNOSIS — F419 Anxiety disorder, unspecified: Secondary | ICD-10-CM | POA: Diagnosis not present

## 2018-10-03 DIAGNOSIS — Z9049 Acquired absence of other specified parts of digestive tract: Secondary | ICD-10-CM | POA: Diagnosis not present

## 2018-10-03 DIAGNOSIS — G43909 Migraine, unspecified, not intractable, without status migrainosus: Secondary | ICD-10-CM | POA: Diagnosis not present

## 2018-10-03 DIAGNOSIS — Z8249 Family history of ischemic heart disease and other diseases of the circulatory system: Secondary | ICD-10-CM | POA: Diagnosis not present

## 2018-10-03 DIAGNOSIS — K219 Gastro-esophageal reflux disease without esophagitis: Secondary | ICD-10-CM | POA: Diagnosis not present

## 2018-10-03 DIAGNOSIS — Z20828 Contact with and (suspected) exposure to other viral communicable diseases: Secondary | ICD-10-CM | POA: Diagnosis not present

## 2018-10-03 DIAGNOSIS — K760 Fatty (change of) liver, not elsewhere classified: Secondary | ICD-10-CM | POA: Diagnosis not present

## 2018-10-03 DIAGNOSIS — R197 Diarrhea, unspecified: Secondary | ICD-10-CM | POA: Diagnosis present

## 2018-10-03 DIAGNOSIS — J45909 Unspecified asthma, uncomplicated: Secondary | ICD-10-CM | POA: Diagnosis not present

## 2018-10-03 DIAGNOSIS — K3184 Gastroparesis: Secondary | ICD-10-CM | POA: Insufficient documentation

## 2018-10-03 DIAGNOSIS — I110 Hypertensive heart disease with heart failure: Secondary | ICD-10-CM | POA: Diagnosis not present

## 2018-10-03 DIAGNOSIS — E1043 Type 1 diabetes mellitus with diabetic autonomic (poly)neuropathy: Secondary | ICD-10-CM

## 2018-10-03 DIAGNOSIS — Z794 Long term (current) use of insulin: Secondary | ICD-10-CM | POA: Insufficient documentation

## 2018-10-03 DIAGNOSIS — Z79899 Other long term (current) drug therapy: Secondary | ICD-10-CM | POA: Insufficient documentation

## 2018-10-03 DIAGNOSIS — N1330 Unspecified hydronephrosis: Secondary | ICD-10-CM | POA: Diagnosis not present

## 2018-10-03 DIAGNOSIS — Z91010 Allergy to peanuts: Secondary | ICD-10-CM | POA: Insufficient documentation

## 2018-10-03 DIAGNOSIS — I10 Essential (primary) hypertension: Secondary | ICD-10-CM | POA: Insufficient documentation

## 2018-10-03 DIAGNOSIS — R112 Nausea with vomiting, unspecified: Secondary | ICD-10-CM | POA: Diagnosis present

## 2018-10-03 LAB — CBG MONITORING, ED: Glucose-Capillary: 139 mg/dL — ABNORMAL HIGH (ref 70–99)

## 2018-10-03 MED ORDER — PROMETHAZINE HCL 25 MG/ML IJ SOLN
12.5000 mg | Freq: Once | INTRAMUSCULAR | Status: AC
  Administered 2018-10-03: 23:00:00 12.5 mg via INTRAVENOUS
  Filled 2018-10-03: qty 1

## 2018-10-03 MED ORDER — ONDANSETRON 4 MG PO TBDP
4.0000 mg | ORAL_TABLET | Freq: Once | ORAL | Status: DC
  Administered 2018-10-03: 4 mg via ORAL
  Filled 2018-10-03: qty 1

## 2018-10-03 MED ORDER — MORPHINE SULFATE (PF) 4 MG/ML IV SOLN
4.0000 mg | Freq: Once | INTRAVENOUS | Status: AC
  Administered 2018-10-03: 23:00:00 4 mg via INTRAVENOUS
  Filled 2018-10-03: qty 1

## 2018-10-03 MED ORDER — SODIUM CHLORIDE 0.9 % IV BOLUS
500.0000 mL | Freq: Once | INTRAVENOUS | Status: AC
  Administered 2018-10-03: 23:00:00 500 mL via INTRAVENOUS

## 2018-10-03 NOTE — ED Triage Notes (Signed)
Pt presents vomiting. Her mother states, "you know her." Pt not answering questions. CBG was 139. Hx of gastroparesis and poorly managed DM1.

## 2018-10-03 NOTE — ED Provider Notes (Signed)
[facility] DEPT Provider Note   CSN: [id number] Arrival date & time: 10/03/18  2019     History   Chief Complaint Chief Complaint  Patient presents with  . Emesis    HPI [name] is a 28 y.o. female with a past medical history of type 1 diabetes, diabetic gastroparesis, GERD, cyclical vomiting syndrome, presents to ED for emesis that began today.  States that this feels similar to her prior flareups of gastroparesis.  States that her sugars have been fine.  Majority of history is limited as patient is vomiting, heaving and unwilling to answer questions.  She does however state that Phenergan and morphine had helped her in the past.     HPI  Past Medical History:  Diagnosis Date  . Anxiety   . Arthritis    "hands, feet, knees" (12/18/2016)  . Asthma   . Diabetic gastroparesis ([place])    Per gastric emptying study 07/09/16 which showed significant delayed gastric emptying.  . Gallstones   . Gastroparesis   . GERD (gastroesophageal reflux disease)   . Heart murmur   . Hepatic steatosis 11/26/2014   and hepatomegaly  . Hypertension    hx (12/18/2016)  . Intractable cyclical vomiting syndrome    Archie Endo 12/18/2016  . Liver mass 11/26/2014  . Pancreatitis, acute 11/26/2014  . Pneumonia    "as a teen X 1" (12/18/2016)  . Type I diabetes mellitus ([place]) 2007   IDDM.  poorly controlled, multiple admits with DKA    Patient Active Problem List   Diagnosis Date Noted  . Diffuse pain   . Elevated blood pressure reading 12/03/2017  . Tachycardia 11/30/2017  . Intractable vomiting with nausea 11/15/2017  . Hypernatremia 11/02/2017  . Nausea and vomiting 01/22/2017  . Nausea & vomiting 01/21/2017  . Intractable cyclical vomiting syndrome 12/18/2016  . Leukocytosis 10/24/2016  . N&V (nausea and vomiting) 10/23/2016  . DKA, type 1 ([place]) 09/26/2016  . Thrombocytosis ([place]) 07/08/2016  . Candiduria, asymptomatic 06/23/2016  . Hyponatremia  06/13/2016  . Hematuria 05/13/2016  . UTI (urinary tract infection) 01/22/2016  . Diarrhea 11/09/2015  . Acute urinary retention   . Gastroparesis due to DM ([place]) 07/10/2015  . GERD (gastroesophageal reflux disease) 07/10/2015  . Depression with anxiety 07/10/2015  . Gastroparesis 06/22/2015  . Altered mental status 06/22/2015  . Volume depletion 06/10/2015  . Protein-calorie malnutrition, severe 06/10/2015  . Hyperglycemia   . Elevated bilirubin   . Hematemesis with nausea   . Intractable nausea and vomiting 05/28/2015  . Abdominal pain in female   . Abdominal pain 05/24/2015  . Hypertension 05/24/2015  . Dehydration   . Diabetic gastroparesis ([place])   . Chronic diastolic heart failure ([place]) 04/11/2015  . Hematemesis 04/08/2015  . DKA (diabetic ketoacidoses) ([place]) 03/22/2015  . S/P laparoscopic cholecystectomy 02/11/2015  . Postextubation stridor   . Pancreatitis, acute 11/26/2014  . Volume overload 11/26/2014  . Hypokalemia 11/26/2014  . Hepatic steatosis 11/26/2014  . Liver mass 11/26/2014  . Sepsis ([place]) 11/25/2014  . Sinus tachycardia 11/25/2014  . Hypomagnesemia 11/25/2014  . Hypophosphatemia 11/25/2014  . Elevated LFTs 11/24/2014  . AKI (acute kidney injury) ([place]) 11/24/2014  . Migraine headache 11/24/2014  . Asthma 06/29/2012  . Uncontrolled type 1 diabetes mellitus ([place]) 06/19/2010  . Goiter, unspecified 06/19/2010    Past Surgical History:  Procedure Laterality Date  . CHOLECYSTECTOMY N/A 02/11/2015   Procedure: LAPAROSCOPIC CHOLECYSTECTOMY WITH INTRAOPERATIVE CHOLANGIOGRAM;  Surgeon: Greer Pickerel, MD;  Location: [facility] ORS;  Service: General;  Laterality: N/A;  . ESOPHAGOGASTRODUODENOSCOPY (EGD) WITH PROPOFOL Left 09/20/2014   Procedure: ESOPHAGOGASTRODUODENOSCOPY (EGD) WITH PROPOFOL;  Surgeon: Arta Silence, MD;  Location: [facility] ENDOSCOPY;  Service: Endoscopy;  Laterality: Left;  . WISDOM TOOTH EXTRACTION       OB History    Gravida  2   Para  1   Term  0    Preterm  1   AB  1   Living  1     SAB  0   TAB  1   Ectopic  0   Multiple  0   Live Births  1            Home Medications    Prior to Admission medications   Medication Sig Start Date End Date Taking? Authorizing Provider  albuterol (PROVENTIL HFA;VENTOLIN HFA) 108 (90 Base) MCG/ACT inhaler Inhale 1-2 puffs into the lungs every 6 (six) hours as needed for wheezing or shortness of breath.    [provider]  blood glucose meter kit and supplies Dispense based on patient and insurance preference. Use up to four times daily as directed. (FOR ICD-10 E10.9, E11.9). 12/07/17   Eugenie Filler, MD  erythromycin ethylsuccinate (EES) 200 MG/5ML suspension Take 5 mLs (200 mg total) by mouth every 8 (eight) hours. 12/27/17   Sheikh, Omair Latif, DO  glucagon (GLUCAGON EMERGENCY) 1 MG injection Inject 1 mg into the muscle once as needed (For very low blood sugars. May repeat in 15-20 minutes if needed.). 12/20/16   Hongalgi, Lenis Dickinson, MD  Insulin Human (INSULIN PUMP) SOLN Inject 1 each into the skin continuous. Novolog insulin. Pt puts in her insulin pump. Car ratio 1-10, insulin sensitive 66. 1.10 from 12 to 12. Every 3 days needs to be replaced. Pt needs to replace today.    [provider]  Insulin Pen Needle 31G X 5 MM MISC 30 Units by Does not apply route at bedtime. 12/07/17   Eugenie Filler, MD  metoCLOPramide (REGLAN) 10 MG tablet Take 1 tablet (10 mg total) by mouth 3 (three) times daily with meals. Patient taking differently: Take 10 mg by mouth 3 (three) times daily as needed for nausea or vomiting.  11/07/17 11/07/18  Debbe Odea, MD  ondansetron (ZOFRAN ODT) 4 MG disintegrating tablet Take 1 tablet (4 mg total) by mouth every 8 (eight) hours as needed for nausea or vomiting. 11/07/17   Debbe Odea, MD  pantoprazole (PROTONIX) 40 MG tablet Take 1 tablet (40 mg total) by mouth daily. 12/21/17 01/20/18  Cristal Ford, DO  promethazine (PHENERGAN) 25 MG  suppository Place 1 suppository (25 mg total) rectally every 6 (six) hours as needed for nausea or vomiting. 12/14/17   Etta Quill, NP  scopolamine (TRANSDERM-SCOP) 1 MG/3DAYS Place 1 patch (1.5 mg total) onto the skin every 3 (three) days as needed (during episodes of vomiting). 11/21/17   Debbe Odea, MD    Family History Family History  Problem Relation Age of Onset  . Heart disease Maternal Grandmother   . Heart disease Maternal Grandfather   . Diabetes Mother   . Hyperlipidemia Mother   . Hypertension Father   . Heart disease Father   . Hypertension Paternal Grandmother   . Cancer Paternal Grandfather     Social History Social History   Tobacco Use  . Smoking status: Never Smoker  . Smokeless tobacco: Never Used  Substance Use Topics  . Alcohol use: No  . Drug use: Yes  Types: Marijuana    Comment: 12/18/2016 q few days"     Allergies   Peanut-containing drug products, Food, and Ultram [tramadol]   Review of Systems Review of Systems  Constitutional: Negative for appetite change, chills and fever.  HENT: Negative for ear pain, rhinorrhea, sneezing and sore throat.   Eyes: Negative for photophobia and visual disturbance.  Respiratory: Negative for cough, chest tightness, shortness of breath and wheezing.   Cardiovascular: Negative for chest pain and palpitations.  Gastrointestinal: Positive for abdominal pain, nausea and vomiting. Negative for blood in stool, constipation and diarrhea.  Genitourinary: Negative for dysuria, hematuria and urgency.  Musculoskeletal: Negative for myalgias.  Skin: Negative for rash.  Neurological: Negative for dizziness, weakness and light-headedness.     Physical Exam Updated Vital Signs BP (!) 116/52   Pulse 92   Temp 97.8 F (36.6 C) (Oral)   Resp 20   SpO2 100%   Physical Exam Vitals signs and nursing note reviewed.  Constitutional:      General: She is not in acute distress.    Appearance: She is well-developed.      Comments: Appears uncomfortable.  Dry heaving on my exam. Rolling around in bed.  HENT:     Head: Normocephalic and atraumatic.     Nose: Nose normal.  Eyes:     General: No scleral icterus.       Right eye: No discharge.        Left eye: No discharge.     Conjunctiva/sclera: Conjunctivae normal.  Neck:     Musculoskeletal: Normal range of motion and neck supple.  Cardiovascular:     Rate and Rhythm: Normal rate and regular rhythm.     Heart sounds: Normal heart sounds. No murmur. No friction rub. No gallop.   Pulmonary:     Effort: Pulmonary effort is normal. No respiratory distress.     Breath sounds: Normal breath sounds.  Abdominal:     General: Bowel sounds are normal. There is no distension.     Palpations: Abdomen is soft.     Tenderness: There is abdominal tenderness (generalized). There is no guarding.  Musculoskeletal: Normal range of motion.  Skin:    General: Skin is warm and dry.     Findings: No rash.  Neurological:     Mental Status: She is alert.     Motor: No abnormal muscle tone.     Coordination: Coordination normal.      ED Treatments / Results  Labs (all labs ordered are listed, but only abnormal results are displayed) Labs Reviewed  CBG MONITORING, ED - Abnormal; Notable for the following components:      Result Value   Glucose-Capillary 139 (*)    All other components within normal limits  LIPASE, BLOOD  COMPREHENSIVE METABOLIC PANEL  CBC  URINALYSIS, ROUTINE W REFLEX MICROSCOPIC  I-STAT BETA HCG BLOOD, ED ([facility], [facility], [facility] ONLY)    EKG EKG Interpretation  Date/Time:  Monday October 03 2018 22:25:25 EDT Ventricular Rate:  101 PR Interval:    QRS Duration: 92 QT Interval:  358 QTC Calculation: 464 R Axis:   72 Text Interpretation:  Sinus tachycardia LVH by voltage No significant change since last tracing Confirmed by Dorie Rank [phone]) on 10/03/2018 10:35:22 PM   Radiology No results found.  Procedures Procedures (including  critical care time)  Medications Ordered in ED Medications  sodium chloride 0.9 % bolus 500 mL (500 mLs Intravenous New Bag/Given (Non-Interop) 10/03/18 2307)  morphine 4 MG/ML  injection 4 mg (4 mg Intravenous Given 10/03/18 2308)  promethazine (PHENERGAN) injection 12.5 mg (12.5 mg Intravenous Given 10/03/18 2321)     Initial Impression / Assessment and Plan / ED Course  I have reviewed the triage vital signs and the nursing notes.  Pertinent labs & imaging results that were available during my care of the patient were reviewed by me and considered in my medical decision making (see chart for details).  Clinical Course as of Oct 04 30  Mon Oct 03, 2018  2344 Patient significantly improved with medications given. Still awaiting blood draw.   [HK]    Clinical Course User Index [HK] Delia Heady, [place]       28yo F with a past medical history of T1DM, gastroparesis presents to ED for emesis, generalized abdominal pain which she relates to gastroparesis flareup. Patient heaving on exam, so unable to obtain full history, although she states this feels similar to prior episodes. States CBG has been normal. Requesting morphine and Phenergan. QTc normal, EKG unchanged from priors. Will obtain labwork and reassess, as IV was able to be obtained but unable to draw blood for lab work. Care handed off to oncoming provider pending dispo. Will discharge home with continued home medications if symptoms improve and labs unremarkable, admit if worsening or no improvement.  Final Clinical Impressions(s) / ED Diagnoses   Final diagnoses:  Diabetic gastroparesis associated with type 1 diabetes mellitus [facility])    ED Discharge Orders    None      Portions of this note were generated with Dragon dictation software. Dictation errors may occur despite best attempts at proofreading.    Delia Heady, PA-C 10/04/18 0032    Charlesetta Shanks, MD 10/06/18 1413

## 2018-10-03 NOTE — ED Notes (Signed)
Pt had diarrhea in the bed as well as in the bathroom. Pt vomited x2 while this writer was in the room changing the linen on the bed .

## 2018-10-03 NOTE — ED Notes (Addendum)
Attempted to start an IV, but was unsuccessful. Will put a IV team consult in.   Prior to starting an IV, observed patient sticking her finger down her throat to induce vomiting.

## 2018-10-03 NOTE — ED Notes (Signed)
When calling for another pt, this pt threw herself to the ground. This Probation officer grabbed gloves and attempted to assist pt back to her wheelchair. Pt refused to help this writer and picked her feet up. Pt was placed back on the ground and security assisted this writer in placing the pt back in her wheelchair. Pt then stated, "My tummy feels tight."

## 2018-10-03 NOTE — ED Notes (Signed)
IV team has obtained an IV, but was successful at obtaining an IV.

## 2018-10-03 NOTE — Discharge Instructions (Addendum)
Take your home medications. Follow-up your primary care doctor. Return here for any new/acute changes.

## 2018-10-04 ENCOUNTER — Observation Stay (HOSPITAL_COMMUNITY)
Admission: EM | Admit: 2018-10-04 | Discharge: 2018-10-05 | Disposition: A | Discharge status: Home / Self Care | Payer: Medicaid Other | Attending: Internal Medicine | Admitting: Internal Medicine

## 2018-10-04 ENCOUNTER — Encounter (HOSPITAL_COMMUNITY): Payer: Self-pay

## 2018-10-04 ENCOUNTER — Emergency Department (HOSPITAL_COMMUNITY): Payer: Medicaid Other

## 2018-10-04 DIAGNOSIS — R112 Nausea with vomiting, unspecified: Secondary | ICD-10-CM | POA: Diagnosis present

## 2018-10-04 DIAGNOSIS — E109 Type 1 diabetes mellitus without complications: Secondary | ICD-10-CM

## 2018-10-04 DIAGNOSIS — I110 Hypertensive heart disease with heart failure: Secondary | ICD-10-CM | POA: Insufficient documentation

## 2018-10-04 DIAGNOSIS — G43909 Migraine, unspecified, not intractable, without status migrainosus: Secondary | ICD-10-CM | POA: Insufficient documentation

## 2018-10-04 DIAGNOSIS — N390 Urinary tract infection, site not specified: Secondary | ICD-10-CM | POA: Diagnosis not present

## 2018-10-04 DIAGNOSIS — F419 Anxiety disorder, unspecified: Secondary | ICD-10-CM | POA: Insufficient documentation

## 2018-10-04 DIAGNOSIS — N1330 Unspecified hydronephrosis: Secondary | ICD-10-CM | POA: Diagnosis present

## 2018-10-04 DIAGNOSIS — Z794 Long term (current) use of insulin: Secondary | ICD-10-CM | POA: Insufficient documentation

## 2018-10-04 DIAGNOSIS — Z9049 Acquired absence of other specified parts of digestive tract: Secondary | ICD-10-CM | POA: Insufficient documentation

## 2018-10-04 DIAGNOSIS — K3184 Gastroparesis: Secondary | ICD-10-CM | POA: Insufficient documentation

## 2018-10-04 DIAGNOSIS — Z8249 Family history of ischemic heart disease and other diseases of the circulatory system: Secondary | ICD-10-CM | POA: Insufficient documentation

## 2018-10-04 DIAGNOSIS — R197 Diarrhea, unspecified: Secondary | ICD-10-CM | POA: Insufficient documentation

## 2018-10-04 DIAGNOSIS — J45909 Unspecified asthma, uncomplicated: Secondary | ICD-10-CM | POA: Insufficient documentation

## 2018-10-04 DIAGNOSIS — K760 Fatty (change of) liver, not elsewhere classified: Secondary | ICD-10-CM | POA: Insufficient documentation

## 2018-10-04 DIAGNOSIS — N179 Acute kidney failure, unspecified: Secondary | ICD-10-CM

## 2018-10-04 DIAGNOSIS — E1043 Type 1 diabetes mellitus with diabetic autonomic (poly)neuropathy: Principal | ICD-10-CM | POA: Insufficient documentation

## 2018-10-04 DIAGNOSIS — K219 Gastro-esophageal reflux disease without esophagitis: Secondary | ICD-10-CM | POA: Insufficient documentation

## 2018-10-04 DIAGNOSIS — Z20828 Contact with and (suspected) exposure to other viral communicable diseases: Secondary | ICD-10-CM | POA: Insufficient documentation

## 2018-10-04 DIAGNOSIS — Z79899 Other long term (current) drug therapy: Secondary | ICD-10-CM | POA: Insufficient documentation

## 2018-10-04 LAB — CBC
HCT: 42.7 % (ref 36.0–46.0)
Hemoglobin: 13.8 g/dL (ref 12.0–15.0)
MCH: 28.7 pg (ref 26.0–34.0)
MCHC: 32.3 g/dL (ref 30.0–36.0)
MCV: 88.8 fL (ref 80.0–100.0)
Platelets: 235 K/uL (ref 150–400)
RBC: 4.81 MIL/uL (ref 3.87–5.11)
RDW: 13.2 % (ref 11.5–15.5)
WBC: 20 K/uL — ABNORMAL HIGH (ref 4.0–10.5)
nRBC: 0 % (ref 0.0–0.2)

## 2018-10-04 LAB — URINALYSIS, ROUTINE W REFLEX MICROSCOPIC
Bacteria, UA: NONE SEEN
Bilirubin Urine: NEGATIVE
Glucose, UA: >=500 mg/dL — AB
Ketones, ur: 80 mg/dL — AB
Nitrite: NEGATIVE
Protein, ur: 100 mg/dL — AB
RBC / HPF: >50 RBC/hpf — ABNORMAL HIGH (ref 0–5)
Specific Gravity, Urine: 1.032 — ABNORMAL HIGH (ref 1.005–1.030)
WBC, UA: >50 WBC/hpf — ABNORMAL HIGH (ref 0–5)
pH: 6 (ref 5.0–8.0)

## 2018-10-04 LAB — COMPREHENSIVE METABOLIC PANEL
ALT: 16 U/L (ref 0–44)
AST: 19 U/L (ref 15–41)
Albumin: 5.4 g/dL — ABNORMAL HIGH (ref 3.5–5.0)
Alkaline Phosphatase: 121 U/L (ref 38–126)
Anion gap: 13 (ref 5–15)
BUN: 27 mg/dL — ABNORMAL HIGH (ref 6–20)
CO2: 20 mmol/L — ABNORMAL LOW (ref 22–32)
Calcium: 9.9 mg/dL (ref 8.9–10.3)
Chloride: 108 mmol/L (ref 98–111)
Creatinine, Ser: 1.02 mg/dL — ABNORMAL HIGH (ref 0.44–1.00)
GFR calc Af Amer: >60 mL/min (ref >60)
GFR calc non Af Amer: >60 mL/min (ref >60)
Glucose, Bld: 110 mg/dL — ABNORMAL HIGH (ref 70–99)
Potassium: 3.5 mmol/L (ref 3.5–5.1)
Sodium: 141 mmol/L (ref 135–145)
Total Bilirubin: 0.8 mg/dL (ref 0.3–1.2)
Total Protein: 9.8 g/dL — ABNORMAL HIGH (ref 6.5–8.1)

## 2018-10-04 LAB — COMPREHENSIVE METABOLIC PANEL WITH GFR
ALT: 13 U/L (ref 0–44)
AST: 22 U/L (ref 15–41)
Albumin: 4.4 g/dL (ref 3.5–5.0)
Alkaline Phosphatase: 99 U/L (ref 38–126)
Anion gap: 12 (ref 5–15)
BUN: 18 mg/dL (ref 6–20)
CO2: 20 mmol/L — ABNORMAL LOW (ref 22–32)
Calcium: 9.2 mg/dL (ref 8.9–10.3)
Chloride: 108 mmol/L (ref 98–111)
Creatinine, Ser: 0.75 mg/dL (ref 0.44–1.00)
GFR calc Af Amer: >60 mL/min (ref >60)
GFR calc non Af Amer: >60 mL/min (ref >60)
Glucose, Bld: 279 mg/dL — ABNORMAL HIGH (ref 70–99)
Potassium: 4.1 mmol/L (ref 3.5–5.1)
Sodium: 140 mmol/L (ref 135–145)
Total Bilirubin: 1.2 mg/dL (ref 0.3–1.2)
Total Protein: 8.5 g/dL — ABNORMAL HIGH (ref 6.5–8.1)

## 2018-10-04 LAB — CBC WITH DIFFERENTIAL/PLATELET
Abs Immature Granulocytes: 0.1 10*3/uL — ABNORMAL HIGH (ref 0.00–0.07)
Basophils Absolute: 0 10*3/uL (ref 0.0–0.1)
Basophils Relative: 0 %
Eosinophils Absolute: 0 10*3/uL (ref 0.0–0.5)
Eosinophils Relative: 0 %
HCT: 43 % (ref 36.0–46.0)
Hemoglobin: 13.8 g/dL (ref 12.0–15.0)
Immature Granulocytes: 1 %
Lymphocytes Relative: 6 %
Lymphs Abs: 1.2 10*3/uL (ref 0.7–4.0)
MCH: 28.6 pg (ref 26.0–34.0)
MCHC: 32.1 g/dL (ref 30.0–36.0)
MCV: 89.2 fL (ref 80.0–100.0)
Monocytes Absolute: 0.8 10*3/uL (ref 0.1–1.0)
Monocytes Relative: 4 %
Neutro Abs: 18 10*3/uL — ABNORMAL HIGH (ref 1.7–7.7)
Neutrophils Relative %: 89 %
Platelets: 458 10*3/uL — ABNORMAL HIGH (ref 150–400)
RBC: 4.82 MIL/uL (ref 3.87–5.11)
RDW: 13.6 % (ref 11.5–15.5)
WBC: 20.1 10*3/uL — ABNORMAL HIGH (ref 4.0–10.5)
nRBC: 0 % (ref 0.0–0.2)

## 2018-10-04 LAB — I-STAT BETA HCG BLOOD, ED (MC, WL, AP ONLY): I-stat hCG, quantitative: <5 m[IU]/mL (ref <5)

## 2018-10-04 LAB — GLUCOSE, CAPILLARY: Glucose-Capillary: 58 mg/dL — ABNORMAL LOW (ref 70–99)

## 2018-10-04 LAB — LIPASE, BLOOD
Lipase: 19 U/L (ref 11–51)
Lipase: 20 U/L (ref 11–51)

## 2018-10-04 LAB — CBG MONITORING, ED: Glucose-Capillary: 194 mg/dL — ABNORMAL HIGH (ref 70–99)

## 2018-10-04 MED ORDER — SODIUM CHLORIDE (PF) 0.9 % IJ SOLN
INTRAMUSCULAR | Status: AC
  Administered 2018-10-04: 17:00:00
  Filled 2018-10-04: qty 50

## 2018-10-04 MED ORDER — MORPHINE SULFATE (PF) 4 MG/ML IV SOLN
4.0000 mg | Freq: Once | INTRAVENOUS | Status: AC
  Administered 2018-10-04: 4 mg via INTRAVENOUS
  Filled 2018-10-04: qty 1

## 2018-10-04 MED ORDER — MORPHINE SULFATE (PF) 4 MG/ML IV SOLN
4.0000 mg | Freq: Once | INTRAVENOUS | Status: AC
  Administered 2018-10-04: 15:00:00 4 mg via INTRAVENOUS
  Filled 2018-10-04: qty 1

## 2018-10-04 MED ORDER — ALBUTEROL SULFATE HFA 108 (90 BASE) MCG/ACT IN AERS: 1.0000 | INHALATION_SPRAY | Freq: Four times a day (QID) | RESPIRATORY_TRACT | Status: DC | PRN

## 2018-10-04 MED ORDER — ONDANSETRON 4 MG PO TBDP
4.0000 mg | ORAL_TABLET | Freq: Once | ORAL | Status: AC
  Administered 2018-10-04: 05:00:00 4 mg via ORAL
  Filled 2018-10-04: qty 1

## 2018-10-04 MED ORDER — PROMETHAZINE HCL 25 MG/ML IJ SOLN
12.5000 mg | Freq: Once | INTRAMUSCULAR | Status: AC
  Administered 2018-10-04: 12.5 mg via INTRAVENOUS
  Filled 2018-10-04: qty 1

## 2018-10-04 MED ORDER — ENOXAPARIN SODIUM 40 MG/0.4ML ~~LOC~~ SOLN
40.0000 mg | Freq: Every day | SUBCUTANEOUS | Status: DC
  Administered 2018-10-05: 40 mg via SUBCUTANEOUS
  Filled 2018-10-04: qty 0.4

## 2018-10-04 MED ORDER — PROMETHAZINE HCL 25 MG PO TABS: 12.5000 mg | ORAL_TABLET | Freq: Four times a day (QID) | ORAL | Status: DC | PRN

## 2018-10-04 MED ORDER — SODIUM CHLORIDE 0.9 % IV SOLN
INTRAVENOUS | Status: DC
  Administered 2018-10-05: via INTRAVENOUS

## 2018-10-04 MED ORDER — MORPHINE SULFATE (PF) 2 MG/ML IV SOLN
2.0000 mg | INTRAVENOUS | Status: DC | PRN
  Administered 2018-10-04 – 2018-10-05 (×2): 2 mg via INTRAVENOUS
  Filled 2018-10-04 (×2): qty 1

## 2018-10-04 MED ORDER — SODIUM CHLORIDE 0.9 % IV BOLUS
1000.0000 mL | Freq: Once | INTRAVENOUS | Status: AC
  Administered 2018-10-04: 15:00:00 1000 mL via INTRAVENOUS

## 2018-10-04 MED ORDER — IOHEXOL 300 MG/ML  SOLN
100.0000 mL | Freq: Once | INTRAMUSCULAR | Status: AC | PRN
  Administered 2018-10-04: 16:00:00 100 mL via INTRAVENOUS

## 2018-10-04 MED ORDER — LISINOPRIL 5 MG PO TABS: 2.5000 mg | ORAL_TABLET | Freq: Every day | ORAL | Status: DC

## 2018-10-04 MED ORDER — HALOPERIDOL LACTATE 5 MG/ML IJ SOLN
2.0000 mg | Freq: Once | INTRAMUSCULAR | Status: AC
  Administered 2018-10-04: 02:00:00 2 mg via INTRAVENOUS
  Filled 2018-10-04: qty 1

## 2018-10-04 MED ORDER — TRIMETHOPRIM 100 MG PO TABS
100.0000 mg | ORAL_TABLET | Freq: Every day | ORAL | Status: DC
  Administered 2018-10-05: 09:00:00 100 mg via ORAL
  Filled 2018-10-04: qty 1

## 2018-10-04 MED ORDER — SODIUM CHLORIDE 0.9 % IV SOLN
1.0000 g | Freq: Once | INTRAVENOUS | Status: AC
  Administered 2018-10-04: 17:00:00 1 g via INTRAVENOUS
  Filled 2018-10-04: qty 10

## 2018-10-04 NOTE — ED Provider Notes (Signed)
[facility] DEPT Provider Note   CSN: [id number] Arrival date & time: 10/04/18  1044     History   Chief Complaint Chief Complaint  Patient presents with   Abdominal Pain   Emesis    HPI Braylon Sandusky is a 28 y.o. female with PMHx type 1 diabetes, HTN, GERD, gastroparesis who presents to the ED today after being discharged < 12 hours ago. Pt initially presented to the ED yesterday for nausea and vomiting x 1 day. Labwork yesterday was reassuring; she did not appear to be in DKA yesterday. Pt did have a WBC count of 20,000 but it did appear that pt typically has an elevation with her gastroparesis. She was given IV phenergan, Morphine, and Haldol with relief. Pt was able to tolerate PO and was discharged home/advised to follow up with her PCP. She states she was not feeling improved at time of discharge despite being found sleeping comfortably after Haldol. She states she went home and tried to sleep off her pain but woke up with continued pain so came back to the ED. Pt states she had an episode of emesis in the lobby. No hematemesis or coffee ground emesis. Denies fever, chills, urinary sx, vaginal discharge, pelvic pain, or any other associated symptoms.        Past Medical History:  Diagnosis Date   Anxiety    Arthritis    "hands, feet, knees" (12/18/2016)   Asthma    Diabetic gastroparesis ([place])    Per gastric emptying study 07/09/16 which showed significant delayed gastric emptying.   Gallstones    Gastroparesis    GERD (gastroesophageal reflux disease)    Heart murmur    Hepatic steatosis 11/26/2014   and hepatomegaly   Hypertension    hx (12/18/2016)   Intractable cyclical vomiting syndrome    /notes 12/18/2016   Liver mass 11/26/2014   Pancreatitis, acute 11/26/2014   Pneumonia    "as a teen X 1" (12/18/2016)   Type I diabetes mellitus ([place]) 2007   IDDM.  poorly controlled, multiple admits with DKA    Patient  Active Problem List   Diagnosis Date Noted   Diffuse pain    Elevated blood pressure reading 12/03/2017   Tachycardia 11/30/2017   Intractable vomiting with nausea 11/15/2017   Hypernatremia 11/02/2017   Nausea and vomiting 01/22/2017   Nausea & vomiting 01/21/2017   Intractable cyclical vomiting syndrome 12/18/2016   Leukocytosis 10/24/2016   N&V (nausea and vomiting) 10/23/2016   DKA, type 1 ([place]) 09/26/2016   Thrombocytosis ([place]) 07/08/2016   Candiduria, asymptomatic 06/23/2016   Hyponatremia 06/13/2016   Hematuria 05/13/2016   UTI (urinary tract infection) 01/22/2016   Diarrhea 11/09/2015   Acute urinary retention    Gastroparesis due to DM ([place]) 07/10/2015   GERD (gastroesophageal reflux disease) 07/10/2015   Depression with anxiety 07/10/2015   Gastroparesis 06/22/2015   Altered mental status 06/22/2015   Volume depletion 06/10/2015   Protein-calorie malnutrition, severe 06/10/2015   Hyperglycemia    Elevated bilirubin    Hematemesis with nausea    Intractable nausea and vomiting 05/28/2015   Abdominal pain in female    Abdominal pain 05/24/2015   Hypertension 05/24/2015   Dehydration    Diabetic gastroparesis ([facility])    Chronic diastolic heart failure ([place]) 04/11/2015   Hematemesis 04/08/2015   DKA (diabetic ketoacidoses) ([place]) 03/22/2015   S/P laparoscopic cholecystectomy 02/11/2015   Postextubation stridor    Pancreatitis, acute 11/26/2014   Volume overload 11/26/2014  Hypokalemia 11/26/2014   Hepatic steatosis 11/26/2014   Liver mass 11/26/2014   Sepsis ([place]) 11/25/2014   Sinus tachycardia 11/25/2014   Hypomagnesemia 11/25/2014   Hypophosphatemia 11/25/2014   Elevated LFTs 11/24/2014   AKI (acute kidney injury) ([place]) 11/24/2014   Migraine headache 11/24/2014   Asthma 06/29/2012   Uncontrolled type 1 diabetes mellitus ([place]) 06/19/2010   Goiter, unspecified 06/19/2010    Past Surgical History:    Procedure Laterality Date   CHOLECYSTECTOMY N/A 02/11/2015   Procedure: LAPAROSCOPIC CHOLECYSTECTOMY WITH INTRAOPERATIVE CHOLANGIOGRAM;  Surgeon: Greer Pickerel, MD;  Location: [facility];  Service: General;  Laterality: N/A;   ESOPHAGOGASTRODUODENOSCOPY (EGD) WITH PROPOFOL Left 09/20/2014   Procedure: ESOPHAGOGASTRODUODENOSCOPY (EGD) WITH PROPOFOL;  Surgeon: Arta Silence, MD;  Location: [facility] ENDOSCOPY;  Service: Endoscopy;  Laterality: Left;   WISDOM TOOTH EXTRACTION       OB History    Gravida  2   Para  1   Term  0   Preterm  1   AB  1   Living  1     SAB  0   TAB  1   Ectopic  0   Multiple  0   Live Births  1            Home Medications    Prior to Admission medications   Medication Sig Start Date End Date Taking? Authorizing Provider  albuterol (PROVENTIL HFA;VENTOLIN HFA) 108 (90 Base) MCG/ACT inhaler Inhale 1-2 puffs into the lungs every 6 (six) hours as needed for wheezing or shortness of breath.    [provider]  blood glucose meter kit and supplies Dispense based on patient and insurance preference. Use up to four times daily as directed. (FOR ICD-10 E10.9, E11.9). 12/07/17   Eugenie Filler, MD  erythromycin ethylsuccinate (EES) 200 MG/5ML suspension Take 5 mLs (200 mg total) by mouth every 8 (eight) hours. 12/27/17   Sheikh, Omair Latif, DO  glucagon (GLUCAGON EMERGENCY) 1 MG injection Inject 1 mg into the muscle once as needed (For very low blood sugars. May repeat in 15-20 minutes if needed.). 12/20/16   Hongalgi, Lenis Dickinson, MD  Insulin Human (INSULIN PUMP) SOLN Inject 1 each into the skin continuous. Novolog insulin. Pt puts in her insulin pump. Car ratio 1-10, insulin sensitive 66. 1.10 from 12 to 12. Every 3 days needs to be replaced. Pt needs to replace today.    [provider]  Insulin Pen Needle 31G X 5 MM MISC 30 Units by Does not apply route at bedtime. 12/07/17   Eugenie Filler, MD  metoCLOPramide (REGLAN) 10 MG tablet Take 1  tablet (10 mg total) by mouth 3 (three) times daily with meals. Patient taking differently: Take 10 mg by mouth 3 (three) times daily as needed for nausea or vomiting.  11/07/17 11/07/18  Debbe Odea, MD  ondansetron (ZOFRAN ODT) 4 MG disintegrating tablet Take 1 tablet (4 mg total) by mouth every 8 (eight) hours as needed for nausea or vomiting. 11/07/17   Debbe Odea, MD  pantoprazole (PROTONIX) 40 MG tablet Take 1 tablet (40 mg total) by mouth daily. 12/21/17 01/20/18  Cristal Ford, DO  promethazine (PHENERGAN) 25 MG suppository Place 1 suppository (25 mg total) rectally every 6 (six) hours as needed for nausea or vomiting. 12/14/17   Etta Quill, NP  scopolamine (TRANSDERM-SCOP) 1 MG/3DAYS Place 1 patch (1.5 mg total) onto the skin every 3 (three) days as needed (during episodes of vomiting). 11/21/17   Debbe Odea, MD  Family History Family History  Problem Relation Age of Onset   Heart disease Maternal Grandmother    Heart disease Maternal Grandfather    Diabetes Mother    Hyperlipidemia Mother    Hypertension Father    Heart disease Father    Hypertension Paternal Grandmother    Cancer Paternal Grandfather     Social History Social History   Tobacco Use   Smoking status: Never Smoker   Smokeless tobacco: Never Used  Substance Use Topics   Alcohol use: No   Drug use: Yes    Types: Marijuana    Comment: 12/18/2016 q few days"     Allergies   Peanut-containing drug products, Food, and Ultram [tramadol]   Review of Systems Review of Systems  Constitutional: Negative for chills and fever.  HENT: Negative for congestion.   Eyes: Negative for visual disturbance.  Respiratory: Negative for shortness of breath.   Cardiovascular: Negative for chest pain.  Gastrointestinal: Positive for abdominal pain, diarrhea, nausea and vomiting. Negative for blood in stool and constipation.  Genitourinary: Negative for difficulty urinating.  Musculoskeletal:  Negative for myalgias.  Skin: Negative for rash.  Neurological: Negative for headaches.     Physical Exam Updated Vital Signs BP 116/90 (BP Location: Left Arm)    Pulse 100    Temp 98.4 F (36.9 C) (Oral)    Resp 17    LMP 09/03/2018    SpO2 99%   Physical Exam Vitals signs and nursing note reviewed.  Constitutional:      Comments: Uncomfortable appearing female. Writhing around in bed holding stomach  HENT:     Head: Normocephalic and atraumatic.  Eyes:     Conjunctiva/sclera: Conjunctivae normal.  Neck:     Musculoskeletal: Neck supple.  Cardiovascular:     Rate and Rhythm: Normal rate and regular rhythm.  Pulmonary:     Effort: Pulmonary effort is normal.     Breath sounds: Normal breath sounds. No wheezing, rhonchi or rales.  Abdominal:     Palpations: Abdomen is soft.     Tenderness: There is generalized abdominal tenderness.  Skin:    General: Skin is warm and dry.  Neurological:     Mental Status: She is alert.      ED Treatments / Results  Labs (all labs ordered are listed, but only abnormal results are displayed) Labs Reviewed  CBC WITH DIFFERENTIAL/PLATELET - Abnormal; Notable for the following components:      Result Value   WBC 20.1 (*)    Platelets 458 (*)    Neutro Abs 18.0 (*)    Abs Immature Granulocytes 0.10 (*)    All other components within normal limits  COMPREHENSIVE METABOLIC PANEL - Abnormal; Notable for the following components:   CO2 20 (*)    Glucose, Bld 110 (*)    BUN 27 (*)    Creatinine, Ser 1.02 (*)    Total Protein 9.8 (*)    Albumin 5.4 (*)    All other components within normal limits  URINALYSIS, ROUTINE W REFLEX MICROSCOPIC - Abnormal; Notable for the following components:   APPearance CLOUDY (*)    Specific Gravity, Urine 1.032 (*)    Glucose, UA >=500 (*)    Hgb urine dipstick LARGE (*)    Ketones, ur 80 (*)    Protein, ur 100 (*)    Leukocytes,Ua MODERATE (*)    RBC / HPF >50 (*)    WBC, UA >50 (*)    All other  components within  normal limits  CBG MONITORING, ED - Abnormal; Notable for the following components:   Glucose-Capillary 194 (*)    All other components within normal limits  NOVEL CORONAVIRUS, NAA (HOSP ORDER, SEND-OUT TO REF LAB; TAT 18-24 HRS)  LIPASE, BLOOD  I-STAT BETA HCG BLOOD, ED ([facility], [facility], [facility] ONLY)    EKG None  Radiology Ct Abdomen Pelvis W Contrast  Result Date: 10/04/2018 CLINICAL DATA:  28 year old female with acute abdominal and pelvic pain for 2 days. EXAM: CT ABDOMEN AND PELVIS WITH CONTRAST TECHNIQUE: Multidetector CT imaging of the abdomen and pelvis was performed using the standard protocol following bolus administration of intravenous contrast. CONTRAST:  140m OMNIPAQUE IOHEXOL 300 MG/ML  SOLN COMPARISON:  11/26/2017 CT and prior studies FINDINGS: Lower chest: No acute abnormality. Hepatobiliary: Very subtle hypodense lesions within both the RIGHT and LEFT lobes are unchanged from 10/23/2016. No new hepatic abnormalities are present. No biliary dilatation. The patient is status post cholecystectomy. Pancreas: Unremarkable Spleen: Unremarkable Adrenals/Urinary Tract: New severe RIGHT hydroureteronephrosis to the bladder is noted without definite obstructing cause identified. Wedge-shaped areas of decreased RIGHT renal enhancement noted and of uncertain chronicity but may represent infection/pyelonephritis. The bladder is distended with mild focal areas of bladder wall thickening. The LEFT kidney and adrenal glands are unremarkable. Stomach/Bowel: Stomach is within normal limits. No evidence of bowel wall thickening, distention, or inflammatory changes. Vascular/Lymphatic: No significant vascular findings are present. No enlarged abdominal or pelvic lymph nodes. Reproductive: Uterus and bilateral adnexa are unremarkable. Other: No ascites, focal collection or pneumoperitoneum. Musculoskeletal: No acute or suspicious bony abnormalities are identified. IMPRESSION: 1. Severe RIGHT  hydroureteronephrosis to a distended bladder with mild focal areas of bladder wall thickening, of uncertain chronicity but new since 11/26/2017. Dilated RIGHT ureter extends to the bladder without obstructing cause identified. Urology consultation is recommended. 2. Wedge shaped areas of decreased attenuation within the RIGHT kidney of uncertain chronicity but pyelonephritis is not excluded. No evidence of renal abscess. Electronically Signed   By: JMargarette CanadaM.D.   On: 10/04/2018 16:26    Procedures Procedures (including critical care time)  Medications Ordered in ED Medications  sodium chloride 0.9 % bolus 1,000 mL (0 mLs Intravenous Stopped 10/04/18 1718)  morphine 4 MG/ML injection 4 mg (4 mg Intravenous Given 10/04/18 1453)  promethazine (PHENERGAN) injection 12.5 mg (12.5 mg Intravenous Given 10/04/18 1451)  sodium chloride (PF) 0.9 % injection (  Given by Other 10/04/18 1657)  iohexol (OMNIPAQUE) 300 MG/ML solution 100 mL (100 mLs Intravenous Contrast Given 10/04/18 1559)  cefTRIAXone (ROCEPHIN) 1 g in sodium chloride 0.9 % 100 mL IVPB (0 g Intravenous Stopped 10/04/18 1756)  morphine 4 MG/ML injection 4 mg (4 mg Intravenous Given 10/04/18 1758)     Initial Impression / Assessment and Plan / ED Course  I have reviewed the triage vital signs and the nursing notes.  Pertinent labs & imaging results that were available during my care of the patient were reviewed by me and considered in my medical decision making (see chart for details).    28year old female who presents to the ED returning since being discharged last night.  Was initially seen for diffuse abdominal pain, nausea, vomiting, diarrhea x1 day.  Does have history of gastroparesis and states this felt similar. ED workup yesterday with WBC 20, no electrolyte abnormalities, normal lipase. Given pain meds, nausea meds, and discharged home after feeling improved. Returning today for worsening pain.  It was thought that patient's white  blood cell count was  elevated due to her gastroparesis yesterday.  She is writhing around in the bed very uncomfortable appearing with diffuse abdominal tenderness.  Does not appear she has had a CT scan since 2018.  Given diffuse abdominal pain and elevated white blood cell count will CT scan abdomen and pelvis today.  We will repeat blood work as I suspect patient may need to be admitted for pain control.  Given IV fluids, morphine, Phenergan for her pain.   Leukocytosis unchanged from yesterday.  Creatinine mildly bumped at 1.02.  Glucose is 110.  No anion gap.  No concern for DKA.  Lipase again negative.  CT scans with findings of right hydroureteronephrosis without any signs of obvious obstruction.  Urinalysis grossly infected with greater than 50 white blood cells, greater than 50 red blood cells, leuks.  Start patient on IV ceftriaxone.  Will consult urology for further evaluation given unknown cause of obstruction.   Discussed case with Dr. Graylon Good with urology who will evaluate patient in the ED.  Dr. Graylon Good with urology evaluated patient; it appears she has been having this hydro intermittently since 2017. They have suggested foley cath at this point with outpatient follow up in 1 weeks time. Pt to keep cath in until then. From a urology stand point no other reccs. Will admit patient to hospitalist service for pain control given gastroparesis.   Discussed case with hospitalist who agrees to admit.   This note was prepared using Dragon voice recognition software and may include unintentional dictation errors due to the inherent limitations of voice recognition software.         Final Clinical Impressions(s) / ED Diagnoses   Final diagnoses:  Gastroparesis  Nausea vomiting and diarrhea  Hydronephrosis, unspecified hydronephrosis type    ED Discharge Orders    None       Eustaquio Maize, PA-C 10/04/18 1858    Lucrezia Starch, MD 10/05/18 [phone]

## 2018-10-04 NOTE — ED Notes (Signed)
Called transport to take patient upstairs

## 2018-10-04 NOTE — ED Notes (Signed)
ED TO INPATIENT HANDOFF REPORT  Name/Age/Gender [name] 28 y.o. female  Code Status    Code Status Orders  (From admission, onward)         Start     Ordered   10/04/18 2002  Full code  Continuous     10/04/18 2002        Code Status History    Date Active Date Inactive Code Status Order ID Comments User Context   12/22/2017 1757 12/27/2017 2015 Full Code [id number]  Tawni Millers, MD Inpatient   12/19/2017 1737 12/21/2017 1403 Full Code [id number]  Cristal Deer, MD Inpatient   12/08/2017 1340 12/11/2017 1618 Full Code [id number]  Damita Lack, MD ED   11/29/2017 0635 12/07/2017 1840 Full Code [id number]  Rise Patience, MD Inpatient   11/15/2017 0503 11/21/2017 1747 Full Code [id number]  Rise Patience, MD Inpatient   11/02/2017 1757 11/07/2017 1354 Full Code [id number]  Charlynne Cousins, MD Inpatient   01/21/2017 2304 01/29/2017 1728 Full Code [id number]  Rise Patience, MD ED   12/18/2016 0704 12/20/2016 1727 Full Code [id number]  Rondel Jumbo, PA-C ED   11/05/2016 1525 11/11/2016 1456 Full Code [id number]  Phillips Grout, MD Inpatient   10/23/2016 1342 10/29/2016 2103 Full Code [id number]  Elwin Mocha, MD ED   09/26/2016 1503 09/29/2016 1533 Full Code [id number]  Georgette Shell, MD ED   07/15/2016 1712 07/18/2016 2035 Full Code [id number]  Damita Lack, MD Inpatient   07/05/2016 1658 07/11/2016 1620 Full Code [id number]  Bonnielee Haff, MD Inpatient   06/23/2016 0856 06/28/2016 1346 Full Code [id number]  Janece Canterbury, MD Inpatient   06/12/2016 2137 06/14/2016 1723 Full Code [id number]  Sid Falcon, MD Inpatient   06/03/2016 2044 06/06/2016 2041 Full Code [id number]  Gardiner Barefoot, NP Inpatient   05/31/2016 2348 06/03/2016 2044 Full Code [id number]  Rise Patience, MD Inpatient   05/14/2016 0212 05/16/2016 1718 Full Code [id number]  Toy Baker, MD Inpatient   05/04/2016 2140 05/10/2016 2004 Full Code [id number]  Orson Eva, MD Inpatient   02/29/2016 2201 03/04/2016 1646 Full Code [id number]  Toy Baker, MD Inpatient   02/20/2016 1441 02/25/2016 1723 Full Code [id number]  Elgergawy, Silver Huguenin, MD Inpatient   01/22/2016 1021 01/25/2016 1607 Full Code [id number]  Orson Eva, MD Inpatient   01/17/2016 0634 01/19/2016 1944 Full Code [id number]  Etta Quill, DO ED   11/18/2015 2238 11/23/2015 1231 Full Code [id number]  Bonnielee Haff, MD Inpatient   11/09/2015 2048 11/12/2015 1535 Full Code [id number]  Toy Baker, MD Inpatient   11/09/2015 2048 11/09/2015 2048 Full Code [id number]  Toy Baker, MD Inpatient   11/03/2015 0238 11/06/2015 1301 Full Code [id number]  Jani Gravel, MD Inpatient   11/02/2015 0128 11/03/2015 0238 Full Code [id number]  Phillips Grout, MD Inpatient   07/10/2015 1645 07/13/2015 1431 Full Code [id number]  Ivor Costa, MD ED   07/01/2015 2203 07/06/2015 1522 Full Code [id number]  Gennaro Africa, MD ED   06/22/2015 0624 06/24/2015 1531 Full Code [id number]  Etta Quill, DO Inpatient   06/10/2015 0156 06/11/2015 1519 Full Code [id number]  Reubin Milan, MD Inpatient   05/28/2015 1609 06/03/2015 1932 Full Code [id number]  Barton Dubois, MD Inpatient   05/24/2015 2359 05/25/2015 1858 Full Code [id number]  Theressa Millard, MD Inpatient   04/21/2015 0931 04/23/2015 1445 Full Code [id number]  Gennaro Africa, MD ED   04/08/2015 2229 04/15/2015 1605 Full Code [id number]  Olevia Bowens,  Gerri Lins, MD ED   03/22/2015 0243 03/25/2015 1811 Full Code [id number]  Rise Patience, MD ED   02/11/2015 1530 02/12/2015 1805 Full Code [id number]  Greer Pickerel, MD Inpatient   11/24/2014 1309 11/29/2014 1929 Full Code [id number]  Rama, Venetia Maxon, MD Inpatient   09/16/2014 0530 09/21/2014 2007 Full Code [id number]  Etta Quill, DO ED   06/29/2012 1400 07/01/2012 1517 Full Code [id number]  Velvet Bathe, MD Inpatient   12/08/2011 1543 12/12/2011 1450 Full Code [id number]  Chyrel Masson, RN Inpatient   Advance Care Planning Activity       Home/SNF/Other Home  Chief Complaint emesis/ abd pain   Level of Care/Admitting Diagnosis ED Disposition    ED Disposition Condition [place]: [facility] [100102]  Level of Care: Med-Surg [16]  Covid Evaluation: Asymptomatic Screening Protocol (No Symptoms)  Diagnosis: Intractable vomiting with nausea [id number]  Admitting Physician: Orene Desanctis [id number]  Attending Physician: Orene Desanctis [id number]  PT Class (Do Not Modify): Observation [104]  PT Acc Code (Do Not Modify): Observation [10022]       Medical History Past Medical History:  Diagnosis Date  . Anxiety   . Arthritis    "hands, feet, knees" (12/18/2016)  . Asthma   . Diabetic gastroparesis ([facility])    Per gastric emptying study 07/09/16 which showed significant delayed gastric emptying.  . Gallstones   . Gastroparesis   . GERD (gastroesophageal reflux disease)   . Heart murmur   . Hepatic steatosis 11/26/2014   and hepatomegaly  . Hypertension    hx (12/18/2016)  . Intractable cyclical vomiting syndrome    Archie Endo 12/18/2016  . Liver mass 11/26/2014  . Pancreatitis, acute 11/26/2014  . Pneumonia    "as a teen X 1" (12/18/2016)  . Type I diabetes mellitus ([place]) 2007   IDDM.  poorly controlled, multiple admits with DKA    Allergies Allergies  Allergen Reactions  . Peanut-Containing Drug Products Swelling and Other (See Comments)    Reaction:  Swelling of mouth and lips   . 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   Reaction:  Swelling of mouth and lips   . Ultram [Tramadol] Itching    IV Location/Drains/Wounds Patient Lines/Drains/Airways Status   Active Line/Drains/Airways    Name:   Placement date:   Placement time:   Site:   Days:   Peripheral IV 10/04/18 Right;Upper Arm   10/04/18    1445    Arm   less than 1   Urethral Catheter Rebekah RN Straight-tip 14 Fr.   10/04/18    1945    Straight-tip   less than 1           Labs/Imaging Results for orders placed or performed during the hospital encounter of 10/04/18 (from the past 48 hour(s))  CBG monitoring, ED     Status: Abnormal   Collection Time: 10/04/18 10:55 AM  Result Value Ref Range   Glucose-Capillary 194 (H) 70 - 99 mg/dL  CBC with Differential     Status: Abnormal   Collection Time: 10/04/18  1:51 PM  Result Value Ref Range   WBC 20.1 (H) 4.0 - 10.5 K/uL   RBC 4.82 3.87 - 5.11 MIL/uL   Hemoglobin 13.8 12.0 - 15.0 g/dL   HCT 43.0 36.0 - 46.0 %   MCV 89.2 80.0 - 100.0 fL   MCH 28.6 26.0 - 34.0 pg  MCHC 32.1 30.0 - 36.0 g/dL   RDW 13.6 11.5 - 15.5 %   Platelets 458 (H) 150 - 400 K/uL   nRBC 0.0 0.0 - 0.2 %   Neutrophils Relative % 89 %   Neutro Abs 18.0 (H) 1.7 - 7.7 K/uL   Lymphocytes Relative 6 %   Lymphs Abs 1.2 0.7 - 4.0 K/uL   Monocytes Relative 4 %   Monocytes Absolute 0.8 0.1 - 1.0 K/uL   Eosinophils Relative 0 %   Eosinophils Absolute 0.0 0.0 - 0.5 K/uL   Basophils Relative 0 %   Basophils Absolute 0.0 0.0 - 0.1 K/uL   Immature Granulocytes 1 %   Abs Immature Granulocytes 0.10 (H) 0.00 - 0.07 K/uL    Comment: Performed at [facility], [place] [address], [place], [place] [address]  Comprehensive metabolic panel     Status: Abnormal   Collection Time: 10/04/18  1:51 PM  Result Value Ref Range   Sodium 141 135 - 145 mmol/L   Potassium 3.5 3.5 - 5.1 mmol/L   Chloride 108 98 - 111 mmol/L   CO2 20 (L) 22 - 32 mmol/L   Glucose, Bld 110 (H) 70 - 99 mg/dL   BUN 27 (H) 6 - 20 mg/dL   Creatinine, Ser 1.02 (H) 0.44 - 1.00 mg/dL   Calcium 9.9 8.9 - 10.3 mg/dL   Total Protein 9.8 (H) 6.5 - 8.1 g/dL   Albumin 5.4 (H) 3.5 - 5.0 g/dL   AST 19 15 - 41 U/L   ALT 16 0 - 44 U/L   Alkaline Phosphatase 121 38 - 126 U/L   Total Bilirubin 0.8 0.3 - 1.2 mg/dL   GFR calc non Af Amer >60 >60 mL/min   GFR calc Af Amer >60 >60 mL/min   Anion gap 13 5 - 15    Comment: Performed at [facility], [place]  [address] [place], [place] [address]  Lipase, blood     Status: None   Collection Time: 10/04/18  1:51 PM  Result Value Ref Range   Lipase 20 11 - 51 U/L    Comment: Performed at [facility], [facility] [address], [place], [place] [address]  I-Stat Beta hCG blood, ED ([facility], [facility], [facility] only)     Status: None   Collection Time: 10/04/18  3:04 PM  Result Value Ref Range   I-stat hCG, quantitative <5.0 <5 mIU/mL   Comment 3            Comment:   GEST. AGE      CONC.  (mIU/mL)   <=1 WEEK        5 - 50     2 WEEKS       50 - 500     3 WEEKS       100 - 10,000     4 WEEKS     1,000 - 30,000        FEMALE AND NON-PREGNANT FEMALE:     LESS THAN 5 mIU/mL   Urinalysis, Routine w reflex microscopic     Status: Abnormal   Collection Time: 10/04/18  4:45 PM  Result Value Ref Range   Color, Urine YELLOW YELLOW   APPearance CLOUDY (A) CLEAR   Specific Gravity, Urine 1.032 (H) 1.005 - 1.030   pH 6.0 5.0 - 8.0   Glucose, UA >=500 (A) NEGATIVE mg/dL   Hgb urine dipstick LARGE (A) NEGATIVE   Bilirubin Urine NEGATIVE NEGATIVE  Ketones, ur 80 (A) NEGATIVE mg/dL   Protein, ur 100 (A) NEGATIVE mg/dL   Nitrite NEGATIVE NEGATIVE   Leukocytes,Ua MODERATE (A) NEGATIVE   RBC / HPF >50 (H) 0 - 5 RBC/hpf   WBC, UA >50 (H) 0 - 5 WBC/hpf   Bacteria, UA NONE SEEN NONE SEEN   Squamous Epithelial / LPF 6-10 0 - 5   Mucus PRESENT    Hyaline Casts, UA PRESENT     Comment: Performed at [facility], [place] [address] [place], [place] [address]   Ct Abdomen Pelvis W Contrast  Result Date: 10/04/2018 CLINICAL DATA:  28 year old female with acute abdominal and pelvic pain for 2 days. EXAM: CT ABDOMEN AND PELVIS WITH CONTRAST TECHNIQUE: Multidetector CT imaging of the abdomen and pelvis was performed using the standard protocol following bolus administration of intravenous contrast. CONTRAST:  159mL OMNIPAQUE IOHEXOL 300 MG/ML  SOLN COMPARISON:  11/26/2017 CT and prior studies FINDINGS: Lower  chest: No acute abnormality. Hepatobiliary: Very subtle hypodense lesions within both the RIGHT and LEFT lobes are unchanged from 10/23/2016. No new hepatic abnormalities are present. No biliary dilatation. The patient is status post cholecystectomy. Pancreas: Unremarkable Spleen: Unremarkable Adrenals/Urinary Tract: New severe RIGHT hydroureteronephrosis to the bladder is noted without definite obstructing cause identified. Wedge-shaped areas of decreased RIGHT renal enhancement noted and of uncertain chronicity but may represent infection/pyelonephritis. The bladder is distended with mild focal areas of bladder wall thickening. The LEFT kidney and adrenal glands are unremarkable. Stomach/Bowel: Stomach is within normal limits. No evidence of bowel wall thickening, distention, or inflammatory changes. Vascular/Lymphatic: No significant vascular findings are present. No enlarged abdominal or pelvic lymph nodes. Reproductive: Uterus and bilateral adnexa are unremarkable. Other: No ascites, focal collection or pneumoperitoneum. Musculoskeletal: No acute or suspicious bony abnormalities are identified. IMPRESSION: 1. Severe RIGHT hydroureteronephrosis to a distended bladder with mild focal areas of bladder wall thickening, of uncertain chronicity but new since 11/26/2017. Dilated RIGHT ureter extends to the bladder without obstructing cause identified. Urology consultation is recommended. 2. Wedge shaped areas of decreased attenuation within the RIGHT kidney of uncertain chronicity but pyelonephritis is not excluded. No evidence of renal abscess. Electronically Signed   By: Margarette Canada M.D.   On: 10/04/2018 16:26    Pending Labs Unresulted Labs (From admission, onward)    Start     Ordered   10/11/18 0500  Creatinine, serum  (enoxaparin (LOVENOX)    CrCl >/= 30 ml/min)  Weekly,   R    Comments: while on enoxaparin therapy    10/04/18 2002   10/05/18 [id number]  Basic metabolic panel  Tomorrow morning,   R      10/04/18 2002   10/05/18 0500  CBC  Tomorrow morning,   R     10/04/18 2002   10/04/18 2001  CBC  (enoxaparin (LOVENOX)    CrCl >/= 30 ml/min)  Once,   STAT    Comments: Baseline for enoxaparin therapy IF NOT ALREADY DRAWN.  Notify MD if PLT < 100 K.    10/04/18 2002   10/04/18 2001  Creatinine, serum  (enoxaparin (LOVENOX)    CrCl >/= 30 ml/min)  Once,   STAT    Comments: Baseline for enoxaparin therapy IF NOT ALREADY DRAWN.    10/04/18 2002   10/04/18 1833  Novel Coronavirus, NAA (Hosp order, Send-out to Ref Lab; TAT 18-24 hrs  (Asymptomatic/Tier 3)  Once,   STAT    Question Answer Comment  Is this test for  diagnosis or screening Screening   Symptomatic for COVID-19 as defined by CDC No   Hospitalized for COVID-19 No   Admitted to ICU for COVID-19 No   Previously tested for COVID-19 No   Resident in a congregate (group) care setting No   Employed in healthcare setting No   Pregnant No      10/04/18 1832          Vitals/Pain Today's Vitals   10/04/18 1930 10/04/18 1949 10/04/18 2030 10/04/18 2106  BP: 109/70  100/76   Pulse: 95  98   Resp: 16  18   Temp:      TempSrc:      SpO2: 100%  100%   PainSc:  4   0-No pain    Isolation Precautions No active isolations  Medications Medications  enoxaparin (LOVENOX) injection 40 mg (has no administration in time range)  promethazine (PHENERGAN) tablet 12.5 mg (has no administration in time range)  morphine 2 MG/ML injection 2 mg (has no administration in time range)  0.9 %  sodium chloride infusion (has no administration in time range)  sodium chloride 0.9 % bolus 1,000 mL (0 mLs Intravenous Stopped 10/04/18 1718)  morphine 4 MG/ML injection 4 mg (4 mg Intravenous Given 10/04/18 1453)  promethazine (PHENERGAN) injection 12.5 mg (12.5 mg Intravenous Given 10/04/18 1451)  sodium chloride (PF) 0.9 % injection (  Given by Other 10/04/18 1657)  iohexol (OMNIPAQUE) 300 MG/ML solution 100 mL (100 mLs Intravenous Contrast Given 10/04/18  1559)  cefTRIAXone (ROCEPHIN) 1 g in sodium chloride 0.9 % 100 mL IVPB (0 g Intravenous Stopped 10/04/18 1756)  morphine 4 MG/ML injection 4 mg (4 mg Intravenous Given 10/04/18 1758)    Mobility walks

## 2018-10-04 NOTE — ED Provider Notes (Signed)
Assumed care from [place] at shift change.  See prior notes for full H&P.  Briefly, 28 y.o. F with hx of cyclic vomiting and gastroparesis.  Has been given medications here and doing better.  Plan:  Difficulty with IV start and obtaining labs so they are pending.  Labs overall reassuring.  Does have leukocytosis but history of same with her gastroparesis episodes.  Glucose 279 but essentially normal bicarb and anion gap.  Clinically not DKA.  Patient began vomiting again, requesting further narcotics.  She was given dose of IV Haldol.  3:19 AM Patient sleeping soundly after IV haldol.  No emesis.  Will monitor.  4:32 AM Patient now awake and alert, she is asking for ginger ale.  Her vitals are stable.  She has not had any vomiting in over an hour.  I feel she is stable for discharge home.  She will need to continue her home medications and follow-up closely with her primary care doctor.  She may return here for any new or acute changes.   Larene Pickett, PA-C 10/04/18 [id number]    Merrily Pew, MD 10/04/18 [phone]

## 2018-10-04 NOTE — ED Notes (Signed)
US IV RN at bedside

## 2018-10-04 NOTE — H&P (Addendum)
History and Physical    [name] WIO:[id number] DOB: 1990/10/14 DOA: 10/04/2018  PCP: Vicenta Aly, FNP  Patient coming from: Home  I have personally briefly reviewed patient's old medical records in [place]  Chief Complaint: Intractable nausea and vomiting  HPI: [name] is a 28 y.o. female with medical history significant of type 1 diabetes with gastroparesis, asthma, hypertension, migraine, recurrent UTI on prophylactic antibiotic who presents for concerns of intractable nausea and vomiting and abdominal pain.  Patient reports that yesterday she was otherwise in her normal state of health when she began to have nausea and vomiting.  She often gets these episodes due to her gastroparesis.  She presented to the ED yesterday but was discharged after she was found asleep and no longer had nausea or vomiting.  However after she got home she began to have similar symptoms again.  She improved some with Zofran of regulation at home but continued of symptoms and decided to come into the ED.  She denies any fevers or chills.  Denies any chest pain, shortness of breath. Has diffuse abdominal pain. Denies any dysuria, increased frequency of urgency.  She denies any tobacco or alcohol use.  Endorses marijuana use last used a week ago.  ED Course: She was afebrile and normotensive with blood pressure 130 over 80s.  Patient was mildly tachycardic up to 110.  CBC showed leukocytosis of 20 and elevated platelets of 458.  BMP shows elevated creatinine of 1.02 from a 0.75 yesterday.  hCG was negative.  CT abdomen showed a severe right hydro-ureteronephrosis to a distended bladder with mild focal area of bladder wall thickening.  Dilated right urethra.  EKG showed normal sinus rhythm.  Review of Systems:  Constitutional: No Weight Change, No Fever ENT/Mouth: No sore throat, No Rhinorrhea Eyes: No Eye Pain, No Vision Changes Cardiovascular: No Chest Pain, no SOB Respiratory: No  Cough, No Sputum, No Wheezing, Gastrointestinal: + Nausea, +Vomiting, No Diarrhea, No Constipation, No Pain Genitourinary: no Urinary Incontinence, No Urgency, No Flank Pain Musculoskeletal: No Arthralgias, No Myalgias Skin: No Skin Lesions, No Pruritus, Neuro: no Weakness, No Numbness,  No Loss of Consciousness, No Syncope Psych: No Anxiety/Panic, No Depression, decrease appetite Heme/Lymph: No Bruising, No Bleeding  Past Medical History:  Diagnosis Date  . Anxiety   . Arthritis    "hands, feet, knees" (12/18/2016)  . Asthma   . Diabetic gastroparesis ([place])    Per gastric emptying study 07/09/16 which showed significant delayed gastric emptying.  . Gallstones   . Gastroparesis   . GERD (gastroesophageal reflux disease)   . Heart murmur   . Hepatic steatosis 11/26/2014   and hepatomegaly  . Hypertension    hx (12/18/2016)  . Intractable cyclical vomiting syndrome    Archie Endo 12/18/2016  . Liver mass 11/26/2014  . Pancreatitis, acute 11/26/2014  . Pneumonia    "as a teen X 1" (12/18/2016)  . Type I diabetes mellitus ([place]) 2007   IDDM.  poorly controlled, multiple admits with DKA    Past Surgical History:  Procedure Laterality Date  . CHOLECYSTECTOMY N/A 02/11/2015   Procedure: LAPAROSCOPIC CHOLECYSTECTOMY WITH INTRAOPERATIVE CHOLANGIOGRAM;  Surgeon: Greer Pickerel, MD;  Location: [facility];  Service: General;  Laterality: N/A;  . ESOPHAGOGASTRODUODENOSCOPY (EGD) WITH PROPOFOL Left 09/20/2014   Procedure: ESOPHAGOGASTRODUODENOSCOPY (EGD) WITH PROPOFOL;  Surgeon: Arta Silence, MD;  Location: [facility];  Service: Endoscopy;  Laterality: Left;  . WISDOM TOOTH EXTRACTION       reports that she has never  smoked. She has never used smokeless tobacco. She reports current drug use. Drug: Marijuana. She reports that she does not drink alcohol.  Allergies  Allergen Reactions  . Peanut-Containing Drug Products Swelling and Other (See Comments)    Reaction:  Swelling of mouth and lips   .  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   Reaction:  Swelling of mouth and lips   . Ultram [Tramadol] Itching    Family History  Problem Relation Age of Onset  . Heart disease Maternal Grandmother   . Heart disease Maternal Grandfather   . Diabetes Mother   . Hyperlipidemia Mother   . Hypertension Father   . Heart disease Father   . Hypertension Paternal Grandmother   . Cancer Paternal Grandfather    Acceptable: Family history reviewed and not pertinent   Prior to Admission medications   Medication Sig Start Date End Date Taking? Authorizing Provider  albuterol (PROVENTIL HFA;VENTOLIN HFA) 108 (90 Base) MCG/ACT inhaler Inhale 1-2 puffs into the lungs every 6 (six) hours as needed for wheezing or shortness of breath.    [provider]  blood glucose meter kit and supplies Dispense based on patient and insurance preference. Use up to four times daily as directed. (FOR ICD-10 E10.9, E11.9). 12/07/17   Eugenie Filler, MD  erythromycin ethylsuccinate (EES) 200 MG/5ML suspension Take 5 mLs (200 mg total) by mouth every 8 (eight) hours. 12/27/17   Sheikh, Omair Latif, DO  glucagon (GLUCAGON EMERGENCY) 1 MG injection Inject 1 mg into the muscle once as needed (For very low blood sugars. May repeat in 15-20 minutes if needed.). 12/20/16   Hongalgi, Lenis Dickinson, MD  Insulin Human (INSULIN PUMP) SOLN Inject 1 each into the skin continuous. Novolog insulin. Pt puts in her insulin pump. Car ratio 1-10, insulin sensitive 66. 1.10 from 12 to 12. Every 3 days needs to be replaced. Pt needs to replace today.    [provider]  Insulin Pen Needle 31G X 5 MM MISC 30 Units by Does not apply route at bedtime. 12/07/17   Eugenie Filler, MD  metoCLOPramide (REGLAN) 10 MG tablet Take 1 tablet (10 mg total) by mouth 3 (three) times daily with meals. Patient taking differently: Take 10 mg by mouth 3 (three) times daily as needed for nausea or vomiting.   11/07/17 11/07/18  Debbe Odea, MD  ondansetron (ZOFRAN ODT) 4 MG disintegrating tablet Take 1 tablet (4 mg total) by mouth every 8 (eight) hours as needed for nausea or vomiting. 11/07/17   Debbe Odea, MD  pantoprazole (PROTONIX) 40 MG tablet Take 1 tablet (40 mg total) by mouth daily. 12/21/17 01/20/18  Cristal Ford, DO  promethazine (PHENERGAN) 25 MG suppository Place 1 suppository (25 mg total) rectally every 6 (six) hours as needed for nausea or vomiting. 12/14/17   Etta Quill, NP  scopolamine (TRANSDERM-SCOP) 1 MG/3DAYS Place 1 patch (1.5 mg total) onto the skin every 3 (three) days as needed (during episodes of vomiting). 11/21/17   Debbe Odea, MD    Physical Exam: Vitals:   10/04/18 1610 10/04/18 1718 10/04/18 1830 10/04/18 1930  BP: 136/84 (!) 143/122 102/63 109/70  Pulse: (!) 103 (!) 119 (!) 104 95  Resp: 18 20 18 16   Temp:      TempSrc:      SpO2: 98% 100% 99% 100%    Constitutional: NAD, calm, comfortable,thin female, talking on the phone Vitals:   10/04/18 1610 10/04/18 1718  10/04/18 1830 10/04/18 1930  BP: 136/84 (!) 143/122 102/63 109/70  Pulse: (!) 103 (!) 119 (!) 104 95  Resp: 18 20 18 16   Temp:      TempSrc:      SpO2: 98% 100% 99% 100%   Eyes: PERRL, lids and conjunctivae normal ENMT: Mucous membranes are moist. Posterior pharynx clear of any exudate or lesions.Normal dentition.  Neck: normal, supple, no masses Respiratory: clear to auscultation bilaterally, no wheezing, no crackles. Normal respiratory effort. No accessory muscle use. Frequent hiccups.  Cardiovascular: Regular rate and rhythm, no murmurs / rubs / gallops. No extremity edema. 2+ pedal pulses. No carotid bruits.  Abdomen: diffuse tenderness, no masses palpated. No hepatosplenomegaly. Bowel sounds positive.  GU: urinary catheter in place with 350cc of yellow colored urine in bag Musculoskeletal: no clubbing / cyanosis. No joint deformity upper and lower extremities. Good ROM, no  contractures. Normal muscle tone.  Skin: no rashes, lesions, ulcers. No induration Neurologic: CN 2-12 grossly intact. Sensation intact, DTR normal. Strength 5/5 in all 4.  Psychiatric: Normal judgment and insight. Alert and oriented x 3. Normal mood.     Labs on Admission: I have personally reviewed following labs and imaging studies  CBC: Recent Labs  Lab 10/04/18 0033 10/04/18 1351  WBC 20.0* 20.1*  NEUTROABS  --  18.0*  HGB 13.8 13.8  HCT 42.7 43.0  MCV 88.8 89.2  PLT 235 409*   Basic Metabolic Panel: Recent Labs  Lab 10/04/18 0033 10/04/18 1351  NA 140 141  K 4.1 3.5  CL 108 108  CO2 20* 20*  GLUCOSE 279* 110*  BUN 18 27*  CREATININE 0.75 1.02*  CALCIUM 9.2 9.9   GFR: CrCl cannot be calculated (Unknown ideal weight.). Liver Function Tests: Recent Labs  Lab 10/04/18 0033 10/04/18 1351  AST 22 19  ALT 13 16  ALKPHOS 99 121  BILITOT 1.2 0.8  PROT 8.5* 9.8*  ALBUMIN 4.4 5.4*   Recent Labs  Lab 10/04/18 0033 10/04/18 1351  LIPASE 19 20   No results for input(s): AMMONIA in the last 168 hours. Coagulation Profile: No results for input(s): INR, PROTIME in the last 168 hours. Cardiac Enzymes: No results for input(s): CKTOTAL, CKMB, CKMBINDEX, TROPONINI in the last 168 hours. BNP (last 3 results) No results for input(s): PROBNP in the last 8760 hours. HbA1C: No results for input(s): HGBA1C in the last 72 hours. CBG: Recent Labs  Lab 10/03/18 2104 10/04/18 1055  GLUCAP 139* 194*   Lipid Profile: No results for input(s): CHOL, HDL, LDLCALC, TRIG, CHOLHDL, LDLDIRECT in the last 72 hours. Thyroid Function Tests: No results for input(s): TSH, T4TOTAL, FREET4, T3FREE, THYROIDAB in the last 72 hours. Anemia Panel: No results for input(s): VITAMINB12, FOLATE, FERRITIN, TIBC, IRON, RETICCTPCT in the last 72 hours. Urine analysis:    Component Value Date/Time   COLORURINE YELLOW 10/04/2018 1645   APPEARANCEUR CLOUDY (A) 10/04/2018 1645   LABSPEC  1.032 (H) 10/04/2018 1645   PHURINE 6.0 10/04/2018 1645   GLUCOSEU >=500 (A) 10/04/2018 1645   GLUCOSEU >=1000 11/07/2012 1205   HGBUR LARGE (A) 10/04/2018 1645   BILIRUBINUR NEGATIVE 10/04/2018 1645   KETONESUR 80 (A) 10/04/2018 1645   PROTEINUR 100 (A) 10/04/2018 1645   UROBILINOGEN 0.2 11/24/2014 1045   NITRITE NEGATIVE 10/04/2018 1645   LEUKOCYTESUR MODERATE (A) 10/04/2018 1645    Radiological Exams on Admission: Ct Abdomen Pelvis W Contrast  Result Date: 10/04/2018 CLINICAL DATA:  28 year old female with acute abdominal and pelvic pain  for 2 days. EXAM: CT ABDOMEN AND PELVIS WITH CONTRAST TECHNIQUE: Multidetector CT imaging of the abdomen and pelvis was performed using the standard protocol following bolus administration of intravenous contrast. CONTRAST:  153m OMNIPAQUE IOHEXOL 300 MG/ML  SOLN COMPARISON:  11/26/2017 CT and prior studies FINDINGS: Lower chest: No acute abnormality. Hepatobiliary: Very subtle hypodense lesions within both the RIGHT and LEFT lobes are unchanged from 10/23/2016. No new hepatic abnormalities are present. No biliary dilatation. The patient is status post cholecystectomy. Pancreas: Unremarkable Spleen: Unremarkable Adrenals/Urinary Tract: New severe RIGHT hydroureteronephrosis to the bladder is noted without definite obstructing cause identified. Wedge-shaped areas of decreased RIGHT renal enhancement noted and of uncertain chronicity but may represent infection/pyelonephritis. The bladder is distended with mild focal areas of bladder wall thickening. The LEFT kidney and adrenal glands are unremarkable. Stomach/Bowel: Stomach is within normal limits. No evidence of bowel wall thickening, distention, or inflammatory changes. Vascular/Lymphatic: No significant vascular findings are present. No enlarged abdominal or pelvic lymph nodes. Reproductive: Uterus and bilateral adnexa are unremarkable. Other: No ascites, focal collection or pneumoperitoneum. Musculoskeletal:  No acute or suspicious bony abnormalities are identified. IMPRESSION: 1. Severe RIGHT hydroureteronephrosis to a distended bladder with mild focal areas of bladder wall thickening, of uncertain chronicity but new since 11/26/2017. Dilated RIGHT ureter extends to the bladder without obstructing cause identified. Urology consultation is recommended. 2. Wedge shaped areas of decreased attenuation within the RIGHT kidney of uncertain chronicity but pyelonephritis is not excluded. No evidence of renal abscess. Electronically Signed   By: JMargarette CanadaM.D.   On: 10/04/2018 16:26    EKG: Independently reviewed. NSR. No QT prolongation.  Assessment/Plan  Intractable nausea and vomiting -Patient with history of gastroparesis and cyclic intractable nausea vomiting. -Liquid diet and can advance diet as tolerated - Morphine as needed for pain  Right hydronephrosis/bladder distention -Urology has been consulted and recommend placement of Foley catheter for decompression of urinary tract.  Recommend she be discharged with Foley catheter in place and will follow up with renal ultrasound after 1 week -Urology is not impressed with urinalysis and did not feel that she has a UTI  # AKI -Likely secondary to both dehydration and chronically distended bladder and right hydronephrosis -IV fluids -Follow BMP in the morning  Recurrent UTI - unsure of leukocytes but no fever and urinalysis without bacteria -Follows with urology outpatient.  Continue home prophylactic bactrim  Type 1 diabetes - Stable.  Can continue insulin pump.  Hypertension - hold Lisinopril given AKI    DVT prophylaxis: Lovenox  Code Status: Full  Family Communication: Plan discussed with patient at bedside  Disposition Plan: Home with observation overnight for cyclic vomiting  Consults called: Urology  Admission status: Observation    Edee Nifong T Veronia Laprise DO [facility]   If 7PM-7AM, please contact night-coverage [email or web address]  Password [email or web address] 10/04/2018, 8:03 PM

## 2018-10-04 NOTE — ED Triage Notes (Addendum)
C/o N/V and abdominal pain (10/10 aching pain)  Patient reports more than 10 occurences of emesis in past 24 hours.  Patient took Zofran and phenergan around 6am with no relief.  CBG-194   HX. gastroparesis ans DM 1    A/Ox4 Wheelchair in triage.

## 2018-10-04 NOTE — ED Notes (Signed)
Provided pt with ice chips 

## 2018-10-04 NOTE — Consult Note (Signed)
Urology Consult   Physician requesting consult: Eustaquio Maize  Reason for consult: Hydronephrosis  History of Present Illness: [name] is a 28 y.o. with a history of type 1 diabetes, gastroparesis, cyclical vomiting, and hydronephrosis presenting with recurrent emesis typical of her prior episodes related to gastroparesis/cyclical vomiting. Urology consulted due to incidental finding of right hydroureteronephrosis on CT scan.  Patient states she has seen urology prior for history of recurrent UTIs for which she is on daily trimethoprim prophylaxis. She also experiences daily mild incontinence, which is likely a mix of stress incontinence and overflow incontinence based on her descriptions. In addition, upon personal review of multiple prior scans since at least 2017, she has had multiple CT scans demonstrating R<L hydronephrosis related to severely distended bladder.  She denies current dysuria, hematuria, incomplete emptying, urgency. She has baseline incontinence. No history of stones. Denies flank pain, fevers.  UA with negative nitrites and no bacteria.  WBC 20 Cr 1.0  Past Medical History:  Diagnosis Date  . Anxiety   . Arthritis    "hands, feet, knees" (12/18/2016)  . Asthma   . Diabetic gastroparesis ([place])    Per gastric emptying study 07/09/16 which showed significant delayed gastric emptying.  . Gallstones   . Gastroparesis   . GERD (gastroesophageal reflux disease)   . Heart murmur   . Hepatic steatosis 11/26/2014   and hepatomegaly  . Hypertension    hx (12/18/2016)  . Intractable cyclical vomiting syndrome    Archie Endo 12/18/2016  . Liver mass 11/26/2014  . Pancreatitis, acute 11/26/2014  . Pneumonia    "as a teen X 1" (12/18/2016)  . Type I diabetes mellitus ([place]) 2007   IDDM.  poorly controlled, multiple admits with DKA    Past Surgical History:  Procedure Laterality Date  . CHOLECYSTECTOMY N/A 02/11/2015   Procedure: LAPAROSCOPIC CHOLECYSTECTOMY WITH  INTRAOPERATIVE CHOLANGIOGRAM;  Surgeon: Greer Pickerel, MD;  Location: [facility];  Service: General;  Laterality: N/A;  . ESOPHAGOGASTRODUODENOSCOPY (EGD) WITH PROPOFOL Left 09/20/2014   Procedure: ESOPHAGOGASTRODUODENOSCOPY (EGD) WITH PROPOFOL;  Surgeon: Arta Silence, MD;  Location: [facility] ENDOSCOPY;  Service: Endoscopy;  Laterality: Left;  . [place] EXTRACTION       Current Hospital Medications:  Home meds:  No current facility-administered medications on file prior to encounter.    Current Outpatient Medications on File Prior to Encounter  Medication Sig Dispense Refill  . albuterol (PROVENTIL HFA;VENTOLIN HFA) 108 (90 Base) MCG/ACT inhaler Inhale 1-2 puffs into the lungs every 6 (six) hours as needed for wheezing or shortness of breath.    . blood glucose meter kit and supplies Dispense based on patient and insurance preference. Use up to four times daily as directed. (FOR ICD-10 E10.9, E11.9). 1 each 0  . erythromycin ethylsuccinate (EES) 200 MG/5ML suspension Take 5 mLs (200 mg total) by mouth every 8 (eight) hours. 100 mL 0  . glucagon (GLUCAGON EMERGENCY) 1 MG injection Inject 1 mg into the muscle once as needed (For very low blood sugars. May repeat in 15-20 minutes if needed.).    . Insulin Human (INSULIN PUMP) SOLN Inject 1 each into the skin continuous. Novolog insulin. Pt puts in her insulin pump. Car ratio 1-10, insulin sensitive 66. 1.10 from 12 to 12. Every 3 days needs to be replaced. Pt needs to replace today.    . Insulin Pen Needle 31G X 5 MM MISC 30 Units by Does not apply route at bedtime. 100 each 0  . metoCLOPramide (REGLAN) 10 MG tablet  Take 1 tablet (10 mg total) by mouth 3 (three) times daily with meals. (Patient taking differently: Take 10 mg by mouth 3 (three) times daily as needed for nausea or vomiting. ) 90 tablet 1  . ondansetron (ZOFRAN ODT) 4 MG disintegrating tablet Take 1 tablet (4 mg total) by mouth every 8 (eight) hours as needed for nausea or vomiting. 30  tablet 0  . pantoprazole (PROTONIX) 40 MG tablet Take 1 tablet (40 mg total) by mouth daily. 30 tablet 0  . promethazine (PHENERGAN) 25 MG suppository Place 1 suppository (25 mg total) rectally every 6 (six) hours as needed for nausea or vomiting. 12 each 0  . scopolamine (TRANSDERM-SCOP) 1 MG/3DAYS Place 1 patch (1.5 mg total) onto the skin every 3 (three) days as needed (during episodes of vomiting). 10 patch 12     Scheduled Meds: Continuous Infusions: PRN Meds:.  Allergies:  Allergies  Allergen Reactions  . Peanut-Containing Drug Products Swelling and Other (See Comments)    Reaction:  Swelling of mouth and lips   . 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   Reaction:  Swelling of mouth and lips   . Ultram [Tramadol] Itching    Family History  Problem Relation Age of Onset  . Heart disease Maternal Grandmother   . Heart disease Maternal Grandfather   . Diabetes Mother   . Hyperlipidemia Mother   . Hypertension Father   . Heart disease Father   . Hypertension Paternal Grandmother   . Cancer Paternal Grandfather     Social History:  reports that she has never smoked. She has never used smokeless tobacco. She reports current drug use. Drug: Marijuana. She reports that she does not drink alcohol.  ROS: A complete review of systems was performed.  All systems are negative except for pertinent findings as noted.  Physical Exam:  Vital signs in last 24 hours: Temp:  [97.8 F (36.6 C)-98.4 F (36.9 C)] 98.4 F (36.9 C) (09/15 1055) Pulse Rate:  [76-119] 104 (09/15 1830) Resp:  [16-20] 18 (09/15 1830) BP: (102-143)/(52-122) 102/63 (09/15 1830) SpO2:  [96 %-100 %] 99 % (09/15 1830) Constitutional:  Alert and oriented, writhing with discomfort Cardiovascular: Tachycardia to 110s Respiratory: Normal respiratory effort GI: Abdomen is soft, diffusely tender, no focal tenderness, no suprapubic tenderness, nondistended. GU: No CVA tenderness  bilaterally Lymphatic: No lymphadenopathy Neurologic: Grossly intact, no focal deficits Psychiatric: Normal mood and affect  Laboratory Data:  Recent Labs    10/04/18 0033 10/04/18 1351  WBC 20.0* 20.1*  HGB 13.8 13.8  HCT 42.7 43.0  PLT 235 458*    Recent Labs    10/04/18 0033 10/04/18 1351  NA 140 141  K 4.1 3.5  CL 108 108  GLUCOSE 279* 110*  BUN 18 27*  CALCIUM 9.2 9.9  CREATININE 0.75 1.02*     Results for orders placed or performed during the hospital encounter of 10/04/18 (from the past 24 hour(s))  CBG monitoring, ED     Status: Abnormal   Collection Time: 10/04/18 10:55 AM  Result Value Ref Range   Glucose-Capillary 194 (H) 70 - 99 mg/dL  CBC with Differential     Status: Abnormal   Collection Time: 10/04/18  1:51 PM  Result Value Ref Range   WBC 20.1 (H) 4.0 - 10.5 K/uL   RBC 4.82 3.87 - 5.11 MIL/uL   Hemoglobin 13.8 12.0 - 15.0 g/dL   HCT 43.0 36.0 - 46.0 %  MCV 89.2 80.0 - 100.0 fL   MCH 28.6 26.0 - 34.0 pg   MCHC 32.1 30.0 - 36.0 g/dL   RDW 13.6 11.5 - 15.5 %   Platelets 458 (H) 150 - 400 K/uL   nRBC 0.0 0.0 - 0.2 %   Neutrophils Relative % 89 %   Neutro Abs 18.0 (H) 1.7 - 7.7 K/uL   Lymphocytes Relative 6 %   Lymphs Abs 1.2 0.7 - 4.0 K/uL   Monocytes Relative 4 %   Monocytes Absolute 0.8 0.1 - 1.0 K/uL   Eosinophils Relative 0 %   Eosinophils Absolute 0.0 0.0 - 0.5 K/uL   Basophils Relative 0 %   Basophils Absolute 0.0 0.0 - 0.1 K/uL   Immature Granulocytes 1 %   Abs Immature Granulocytes 0.10 (H) 0.00 - 0.07 K/uL  Comprehensive metabolic panel     Status: Abnormal   Collection Time: 10/04/18  1:51 PM  Result Value Ref Range   Sodium 141 135 - 145 mmol/L   Potassium 3.5 3.5 - 5.1 mmol/L   Chloride 108 98 - 111 mmol/L   CO2 20 (L) 22 - 32 mmol/L   Glucose, Bld 110 (H) 70 - 99 mg/dL   BUN 27 (H) 6 - 20 mg/dL   Creatinine, Ser 1.02 (H) 0.44 - 1.00 mg/dL   Calcium 9.9 8.9 - 10.3 mg/dL   Total Protein 9.8 (H) 6.5 - 8.1 g/dL   Albumin  5.4 (H) 3.5 - 5.0 g/dL   AST 19 15 - 41 U/L   ALT 16 0 - 44 U/L   Alkaline Phosphatase 121 38 - 126 U/L   Total Bilirubin 0.8 0.3 - 1.2 mg/dL   GFR calc non Af Amer >60 >60 mL/min   GFR calc Af Amer >60 >60 mL/min   Anion gap 13 5 - 15  Lipase, blood     Status: None   Collection Time: 10/04/18  1:51 PM  Result Value Ref Range   Lipase 20 11 - 51 U/L  I-Stat Beta hCG blood, ED ([facility], [facility], [facility] only)     Status: None   Collection Time: 10/04/18  3:04 PM  Result Value Ref Range   I-stat hCG, quantitative <5.0 <5 mIU/mL   Comment 3          Urinalysis, Routine w reflex microscopic     Status: Abnormal   Collection Time: 10/04/18  4:45 PM  Result Value Ref Range   Color, Urine YELLOW YELLOW   APPearance CLOUDY (A) CLEAR   Specific Gravity, Urine 1.032 (H) 1.005 - 1.030   pH 6.0 5.0 - 8.0   Glucose, UA >=500 (A) NEGATIVE mg/dL   Hgb urine dipstick LARGE (A) NEGATIVE   Bilirubin Urine NEGATIVE NEGATIVE   Ketones, ur 80 (A) NEGATIVE mg/dL   Protein, ur 100 (A) NEGATIVE mg/dL   Nitrite NEGATIVE NEGATIVE   Leukocytes,Ua MODERATE (A) NEGATIVE   RBC / HPF >50 (H) 0 - 5 RBC/hpf   WBC, UA >50 (H) 0 - 5 WBC/hpf   Bacteria, UA NONE SEEN NONE SEEN   Squamous Epithelial / LPF 6-10 0 - 5   Mucus PRESENT    Hyaline Casts, UA PRESENT    No results found for this or any previous visit (from the past 240 hour(s)).  Renal Function: Recent Labs    10/04/18 0033 10/04/18 1351  CREATININE 0.75 1.02*   CrCl cannot be calculated (Unknown ideal weight.).  Radiologic Imaging: Ct Abdomen Pelvis W Contrast  Result Date:  10/04/2018 CLINICAL DATA:  28 year old female with acute abdominal and pelvic pain for 2 days. EXAM: CT ABDOMEN AND PELVIS WITH CONTRAST TECHNIQUE: Multidetector CT imaging of the abdomen and pelvis was performed using the standard protocol following bolus administration of intravenous contrast. CONTRAST:  124m OMNIPAQUE IOHEXOL 300 MG/ML  SOLN COMPARISON:  11/26/2017 CT and prior  studies FINDINGS: Lower chest: No acute abnormality. Hepatobiliary: Very subtle hypodense lesions within both the RIGHT and LEFT lobes are unchanged from 10/23/2016. No new hepatic abnormalities are present. No biliary dilatation. The patient is status post cholecystectomy. Pancreas: Unremarkable Spleen: Unremarkable Adrenals/Urinary Tract: New severe RIGHT hydroureteronephrosis to the bladder is noted without definite obstructing cause identified. Wedge-shaped areas of decreased RIGHT renal enhancement noted and of uncertain chronicity but may represent infection/pyelonephritis. The bladder is distended with mild focal areas of bladder wall thickening. The LEFT kidney and adrenal glands are unremarkable. Stomach/Bowel: Stomach is within normal limits. No evidence of bowel wall thickening, distention, or inflammatory changes. Vascular/Lymphatic: No significant vascular findings are present. No enlarged abdominal or pelvic lymph nodes. Reproductive: Uterus and bilateral adnexa are unremarkable. Other: No ascites, focal collection or pneumoperitoneum. Musculoskeletal: No acute or suspicious bony abnormalities are identified. IMPRESSION: 1. Severe RIGHT hydroureteronephrosis to a distended bladder with mild focal areas of bladder wall thickening, of uncertain chronicity but new since 11/26/2017. Dilated RIGHT ureter extends to the bladder without obstructing cause identified. Urology consultation is recommended. 2. Wedge shaped areas of decreased attenuation within the RIGHT kidney of uncertain chronicity but pyelonephritis is not excluded. No evidence of renal abscess. Electronically Signed   By: JMargarette CanadaM.D.   On: 10/04/2018 16:26    I independently reviewed the above imaging studies.  Impression/Recommendation: 28yo F with history of hydronephrosis presenting with CT finding of right hydroureteronephrosis and severely distended bladder in setting of recurrent emesis likely secondary to her  gastroparesis/cyclic vomiting syndrome.  Her CT finding likely represents chronic hydronephrosis secondary to severely distended bladder, which has been seen on prior scans. She is already followed by urology clinic. There is no obstructing lesion or stone, and ureteral stenting is not indicated at this time. UA is reassuring and low suspicion for UTI as source of symptoms. She also has no significant AKI; Cr is only slightly increased from prior in setting of dehydration after emesis but still within normal.   Given bladder remains severely distended on CT scan and is likely cause of hydronephrosis, we recommend placement of Foley catheter at this time for decompression of urinary tract. At discharge, patient should be discharged with Foley catheter in place and to drainage. We will follow up with her in about a week for renal ultrasound after she has had catheter indwelling for > 1 week.  We will defer management of her emesis and decision regarding any antibiotics to admitting team.   EHaskel Schroeder9/15/2020, 7:28 PM

## 2018-10-04 NOTE — ED Notes (Signed)
Pt states that they normally have a hard to getting an IV on her. This attempted x1 with no success. [place] IV RN will attempt to get an IV.

## 2018-10-05 ENCOUNTER — Inpatient Hospital Stay (HOSPITAL_COMMUNITY)
Admission: EM | Admit: 2018-10-05 | Discharge: 2018-10-16 | DRG: 074 | Disposition: A | Discharge status: Home / Self Care | Payer: Medicaid Other | Attending: Internal Medicine | Admitting: Internal Medicine

## 2018-10-05 ENCOUNTER — Other Ambulatory Visit: Payer: Self-pay

## 2018-10-05 DIAGNOSIS — E860 Dehydration: Secondary | ICD-10-CM | POA: Diagnosis present

## 2018-10-05 DIAGNOSIS — E1043 Type 1 diabetes mellitus with diabetic autonomic (poly)neuropathy: Principal | ICD-10-CM | POA: Diagnosis present

## 2018-10-05 DIAGNOSIS — Z885 Allergy status to narcotic agent status: Secondary | ICD-10-CM

## 2018-10-05 DIAGNOSIS — Z23 Encounter for immunization: Secondary | ICD-10-CM

## 2018-10-05 DIAGNOSIS — I10 Essential (primary) hypertension: Secondary | ICD-10-CM | POA: Diagnosis present

## 2018-10-05 DIAGNOSIS — Z20828 Contact with and (suspected) exposure to other viral communicable diseases: Secondary | ICD-10-CM | POA: Diagnosis present

## 2018-10-05 DIAGNOSIS — Z9641 Presence of insulin pump (external) (internal): Secondary | ICD-10-CM | POA: Diagnosis present

## 2018-10-05 DIAGNOSIS — Z8249 Family history of ischemic heart disease and other diseases of the circulatory system: Secondary | ICD-10-CM

## 2018-10-05 DIAGNOSIS — K3184 Gastroparesis: Secondary | ICD-10-CM | POA: Diagnosis present

## 2018-10-05 DIAGNOSIS — N1330 Unspecified hydronephrosis: Secondary | ICD-10-CM | POA: Diagnosis present

## 2018-10-05 DIAGNOSIS — Z9049 Acquired absence of other specified parts of digestive tract: Secondary | ICD-10-CM

## 2018-10-05 DIAGNOSIS — R109 Unspecified abdominal pain: Secondary | ICD-10-CM

## 2018-10-05 DIAGNOSIS — Z79899 Other long term (current) drug therapy: Secondary | ICD-10-CM

## 2018-10-05 DIAGNOSIS — E1143 Type 2 diabetes mellitus with diabetic autonomic (poly)neuropathy: Secondary | ICD-10-CM | POA: Diagnosis present

## 2018-10-05 DIAGNOSIS — K760 Fatty (change of) liver, not elsewhere classified: Secondary | ICD-10-CM | POA: Diagnosis present

## 2018-10-05 DIAGNOSIS — Z794 Long term (current) use of insulin: Secondary | ICD-10-CM

## 2018-10-05 DIAGNOSIS — K219 Gastro-esophageal reflux disease without esophagitis: Secondary | ICD-10-CM | POA: Diagnosis present

## 2018-10-05 DIAGNOSIS — E1065 Type 1 diabetes mellitus with hyperglycemia: Secondary | ICD-10-CM | POA: Diagnosis present

## 2018-10-05 DIAGNOSIS — Z8349 Family history of other endocrine, nutritional and metabolic diseases: Secondary | ICD-10-CM

## 2018-10-05 DIAGNOSIS — Z91018 Allergy to other foods: Secondary | ICD-10-CM

## 2018-10-05 DIAGNOSIS — Z809 Family history of malignant neoplasm, unspecified: Secondary | ICD-10-CM

## 2018-10-05 DIAGNOSIS — E876 Hypokalemia: Secondary | ICD-10-CM | POA: Diagnosis present

## 2018-10-05 DIAGNOSIS — N111 Chronic obstructive pyelonephritis: Secondary | ICD-10-CM | POA: Diagnosis present

## 2018-10-05 DIAGNOSIS — Z833 Family history of diabetes mellitus: Secondary | ICD-10-CM

## 2018-10-05 DIAGNOSIS — E10649 Type 1 diabetes mellitus with hypoglycemia without coma: Secondary | ICD-10-CM | POA: Diagnosis not present

## 2018-10-05 DIAGNOSIS — R112 Nausea with vomiting, unspecified: Secondary | ICD-10-CM | POA: Diagnosis present

## 2018-10-05 DIAGNOSIS — N10 Acute tubulo-interstitial nephritis: Secondary | ICD-10-CM | POA: Diagnosis present

## 2018-10-05 DIAGNOSIS — J45909 Unspecified asthma, uncomplicated: Secondary | ICD-10-CM | POA: Diagnosis present

## 2018-10-05 DIAGNOSIS — R000 Tachycardia, unspecified: Secondary | ICD-10-CM | POA: Diagnosis present

## 2018-10-05 DIAGNOSIS — IMO0002 Reserved for concepts with insufficient information to code with codable children: Secondary | ICD-10-CM | POA: Diagnosis present

## 2018-10-05 LAB — BASIC METABOLIC PANEL
Anion gap: 10 (ref 5–15)
BUN: 19 mg/dL (ref 6–20)
CO2: 20 mmol/L — ABNORMAL LOW (ref 22–32)
Calcium: 8.4 mg/dL — ABNORMAL LOW (ref 8.9–10.3)
Chloride: 106 mmol/L (ref 98–111)
Creatinine, Ser: 0.64 mg/dL (ref 0.44–1.00)
GFR calc Af Amer: >60 mL/min (ref >60)
GFR calc non Af Amer: >60 mL/min (ref >60)
Glucose, Bld: 149 mg/dL — ABNORMAL HIGH (ref 70–99)
Potassium: 3.4 mmol/L — ABNORMAL LOW (ref 3.5–5.1)
Sodium: 136 mmol/L (ref 135–145)

## 2018-10-05 LAB — CBC
HCT: 36.6 % (ref 36.0–46.0)
Hemoglobin: 11.7 g/dL — ABNORMAL LOW (ref 12.0–15.0)
MCH: 29.1 pg (ref 26.0–34.0)
MCHC: 32 g/dL (ref 30.0–36.0)
MCV: 91 fL (ref 80.0–100.0)
Platelets: 342 10*3/uL (ref 150–400)
RBC: 4.02 MIL/uL (ref 3.87–5.11)
RDW: 13.4 % (ref 11.5–15.5)
WBC: 16.8 10*3/uL — ABNORMAL HIGH (ref 4.0–10.5)
nRBC: 0 % (ref 0.0–0.2)

## 2018-10-05 LAB — GLUCOSE, CAPILLARY: Glucose-Capillary: 93 mg/dL (ref 70–99)

## 2018-10-05 MED ORDER — SCOPOLAMINE 1 MG/3DAYS TD PT72
1.0000 | MEDICATED_PATCH | TRANSDERMAL | Status: DC
  Administered 2018-10-05 – 2018-10-14 (×4): 1.5 mg via TRANSDERMAL
  Filled 2018-10-05 (×4): qty 1

## 2018-10-05 MED ORDER — PROMETHAZINE HCL 25 MG/ML IJ SOLN
25.0000 mg | Freq: Once | INTRAMUSCULAR | Status: AC
  Administered 2018-10-06: 25 mg via INTRAVENOUS
  Filled 2018-10-05: qty 1

## 2018-10-05 MED ORDER — SODIUM CHLORIDE 0.9 % IV BOLUS
1000.0000 mL | Freq: Once | INTRAVENOUS | Status: AC
  Administered 2018-10-06: 01:00:00 1000 mL via INTRAVENOUS

## 2018-10-05 MED ORDER — HALOPERIDOL LACTATE 5 MG/ML IJ SOLN
5.0000 mg | Freq: Once | INTRAMUSCULAR | Status: AC
  Administered 2018-10-05: 5 mg via INTRAMUSCULAR
  Filled 2018-10-05: qty 1

## 2018-10-05 NOTE — TOC Transition Note (Signed)
Transition of Care [facility]) - CM/SW Discharge Note   Patient Details  Name: [name] MRN: [id number] Date of Birth: 12/11/1990  [facility]) CM/SW Contact:  Leeroy Cha, RN Phone Number: 10/05/2018, 10:14 AM   Clinical Narrative:    dcd to home with self care.   Final next level of care: Home/Self Care Barriers to Discharge: No Barriers Identified   Patient Goals and CMS Choice        Discharge Placement                       Discharge Plan and Services   Discharge Planning Services: CM Consult                                 Social Determinants of Health (SDOH) Interventions     Readmission Risk Interventions No flowsheet data found.

## 2018-10-05 NOTE — ED Provider Notes (Signed)
[facility] DEPT Provider Note   CSN: [id number] Arrival date & time: 10/05/18  1553     History   Chief Complaint Chief Complaint  Patient presents with   Emesis    HPI [name] is a 28 y.o. female with a history of diabetes mellitus type 1, diabetic gastroparesis, and recurrent UTI on prophylactic antibiotics who presents to the emergency department with a chief complaint of vomiting and abdominal pain.  The patient reports that she was discharged from the hospital earlier today around noon after she was admitted for intractable vomiting secondary to diabetic gastroparesis.  She reports that since discharge that her pain has significantly worsened throughout her abdomen.  Pain is constant and cramping.  No known aggravating or alleviating factors.  She is writhing around on the bed on exam.  She reports countless episodes of nonbloody, nonbilious vomiting since discharge despite taking Zofran at home.  Last dose was prior to arrival.  She denies fever, chills, hematemesis, diarrhea, constipation, dysuria, hematuria, vaginal bleeding or discharge, chest pain, shortness of breath.  She has a history of marijuana use, last use was 1 week ago.  During her most recent hospitalization, she had CT imaging of the abdomen that demonstrated severe right hydroureteronephrosis to a distended bladder with mild focal areas of bladder wall thickening of uncertain etiology.  Urology was consulted and Foley catheter was recommended for 1 week with outpatient follow-up to neurology.  She was also discharged on Bactrim prophylactically.       The history is provided by the patient. No language interpreter was used.    Past Medical History:  Diagnosis Date   Anxiety    Arthritis    "hands, feet, knees" (12/18/2016)   Asthma    Diabetic gastroparesis ([place])    Per gastric emptying study 07/09/16 which showed significant delayed gastric emptying.    Gallstones    Gastroparesis    GERD (gastroesophageal reflux disease)    Heart murmur    Hepatic steatosis 11/26/2014   and hepatomegaly   Hypertension    hx (12/18/2016)   Intractable cyclical vomiting syndrome    /notes 12/18/2016   Liver mass 11/26/2014   Pancreatitis, acute 11/26/2014   Pneumonia    "as a teen X 1" (12/18/2016)   Type I diabetes mellitus ([place]) 2007   IDDM.  poorly controlled, multiple admits with DKA    Patient Active Problem List   Diagnosis Date Noted   Hydronephrosis of right kidney 10/05/2018   Diffuse pain    Elevated blood pressure reading 12/03/2017   Tachycardia 11/30/2017   Hypernatremia 11/02/2017   Nausea and vomiting 01/22/2017   Nausea & vomiting 01/21/2017   Intractable cyclical vomiting syndrome 12/18/2016   Leukocytosis 10/24/2016   N&V (nausea and vomiting) 10/23/2016   DKA, type 1 ([place]) 09/26/2016   Thrombocytosis ([place]) 07/08/2016   Candiduria, asymptomatic 06/23/2016   Hyponatremia 06/13/2016   Hematuria 05/13/2016   UTI (urinary tract infection) 01/22/2016   Diarrhea 11/09/2015   Acute urinary retention    Gastroparesis due to DM ([place]) 07/10/2015   GERD (gastroesophageal reflux disease) 07/10/2015   Depression with anxiety 07/10/2015   Gastroparesis 06/22/2015   Altered mental status 06/22/2015   Volume depletion 06/10/2015   Protein-calorie malnutrition, severe 06/10/2015   Hyperglycemia    Elevated bilirubin    Hematemesis with nausea    Intractable nausea and vomiting 05/28/2015   Abdominal pain in female    Abdominal pain 05/24/2015   Hypertension 05/24/2015  Dehydration    Diabetic gastroparesis ([facility])    Chronic diastolic heart failure ([place]) 04/11/2015   Hematemesis 04/08/2015   DKA (diabetic ketoacidoses) ([place]) 03/22/2015   S/P laparoscopic cholecystectomy 02/11/2015   Postextubation stridor    Pancreatitis, acute 11/26/2014   Volume overload 11/26/2014    Hypokalemia 11/26/2014   Hepatic steatosis 11/26/2014   Liver mass 11/26/2014   Sepsis ([place]) 11/25/2014   Sinus tachycardia 11/25/2014   Hypomagnesemia 11/25/2014   Hypophosphatemia 11/25/2014   Elevated LFTs 11/24/2014   AKI (acute kidney injury) ([place]) 11/24/2014   Migraine headache 11/24/2014   Asthma 06/29/2012   Uncontrolled type 1 diabetes mellitus ([place]) 06/19/2010   Goiter, unspecified 06/19/2010    Past Surgical History:  Procedure Laterality Date   CHOLECYSTECTOMY N/A 02/11/2015   Procedure: LAPAROSCOPIC CHOLECYSTECTOMY WITH INTRAOPERATIVE CHOLANGIOGRAM;  Surgeon: Greer Pickerel, MD;  Location: [facility] ORS;  Service: General;  Laterality: N/A;   ESOPHAGOGASTRODUODENOSCOPY (EGD) WITH PROPOFOL Left 09/20/2014   Procedure: ESOPHAGOGASTRODUODENOSCOPY (EGD) WITH PROPOFOL;  Surgeon: Arta Silence, MD;  Location: [facility] ENDOSCOPY;  Service: Endoscopy;  Laterality: Left;   WISDOM TOOTH EXTRACTION       OB History    Gravida  2   Para  1   Term  0   Preterm  1   AB  1   Living  1     SAB  0   TAB  1   Ectopic  0   Multiple  0   Live Births  1            Home Medications    Prior to Admission medications   Medication Sig Start Date End Date Taking? Authorizing Provider  albuterol (PROVENTIL HFA;VENTOLIN HFA) 108 (90 Base) MCG/ACT inhaler Inhale 1-2 puffs into the lungs every 6 (six) hours as needed for wheezing or shortness of breath.   Yes [provider]  Insulin Human (INSULIN PUMP) SOLN Inject 1 each into the skin continuous. Novolog insulin. Pt puts in her insulin pump: Medtronic 630G. Carb ratio 1-10, insulin sensitive 66. 1.10 from 12 to 12. Every 3 days needs to be replaced.   Yes [provider]  lisinopril (ZESTRIL) 2.5 MG tablet Take 2.5 mg by mouth daily.   Yes [provider]  metoCLOPramide (REGLAN) 10 MG tablet Take 1 tablet (10 mg total) by mouth 3 (three) times daily with meals. Patient taking differently: Take  10 mg by mouth 3 (three) times daily as needed for nausea or vomiting.  11/07/17 11/07/18 Yes Debbe Odea, MD  ondansetron (ZOFRAN ODT) 4 MG disintegrating tablet Take 1 tablet (4 mg total) by mouth every 8 (eight) hours as needed for nausea or vomiting. 11/07/17  Yes Debbe Odea, MD  promethazine (PHENERGAN) 25 MG suppository Place 1 suppository (25 mg total) rectally every 6 (six) hours as needed for nausea or vomiting. 12/14/17  Yes Etta Quill, NP  trimethoprim (TRIMPEX) 100 MG tablet Take 100 mg by mouth daily. UTI prophylaxis. 09/09/18  Yes [provider]  blood glucose meter kit and supplies Dispense based on patient and insurance preference. Use up to four times daily as directed. (FOR ICD-10 E10.9, E11.9). 12/07/17   Eugenie Filler, MD  glucagon (GLUCAGON EMERGENCY) 1 MG injection Inject 1 mg into the muscle once as needed (For very low blood sugars. May repeat in 15-20 minutes if needed.). 12/20/16   Hongalgi, Lenis Dickinson, MD  Insulin Pen Needle 31G X 5 MM MISC 30 Units by Does not apply route at bedtime.  12/07/17   Eugenie Filler, MD  pantoprazole (PROTONIX) 40 MG tablet Take 1 tablet (40 mg total) by mouth daily. Patient not taking: Reported on 10/05/2018 12/21/17 10/05/18  Cristal Ford, DO  scopolamine (TRANSDERM-SCOP) 1 MG/3DAYS Place 1 patch (1.5 mg total) onto the skin every 3 (three) days as needed (during episodes of vomiting). Patient not taking: Reported on 10/04/2018 11/21/17   Debbe Odea, MD    Family History Family History  Problem Relation Age of Onset   Heart disease Maternal Grandmother    Heart disease Maternal Grandfather    Diabetes Mother    Hyperlipidemia Mother    Hypertension Father    Heart disease Father    Hypertension Paternal Grandmother    Cancer Paternal Grandfather     Social History Social History   Tobacco Use   Smoking status: Never Smoker   Smokeless tobacco: Never Used  Substance Use Topics   Alcohol use: No    Drug use: Yes    Types: Marijuana    Comment: 12/18/2016 q few days"     Allergies   Peanut-containing drug products, Food, and Ultram [tramadol]   Review of Systems Review of Systems  Constitutional: Negative for activity change, chills and fever.  Respiratory: Negative for shortness of breath and wheezing.   Cardiovascular: Negative for chest pain.  Gastrointestinal: Positive for abdominal pain, diarrhea, nausea and vomiting.  Genitourinary: Negative for dysuria, flank pain, frequency, hematuria, urgency, vaginal bleeding, vaginal discharge and vaginal pain.  Musculoskeletal: Negative for back pain.  Skin: Negative for rash.  Allergic/Immunologic: Negative for immunocompromised state.  Neurological: Negative for headaches.  Psychiatric/Behavioral: Negative for confusion.     Physical Exam Updated Vital Signs BP (!) 123/100    Pulse (!) 103    Temp 98.4 F (36.9 C) (Oral)    Resp 14    Ht 5' 3"  (1.6 m)    SpO2 99%    BMI 24.06 kg/m   Physical Exam Vitals signs and nursing note reviewed.  Constitutional:      General: She is not in acute distress.    Comments: Uncomfortable appearing  HENT:     Head: Normocephalic.     Mouth/Throat:     Mouth: Mucous membranes are dry.  Eyes:     Conjunctiva/sclera: Conjunctivae normal.  Neck:     Musculoskeletal: Neck supple.  Cardiovascular:     Rate and Rhythm: Regular rhythm. Tachycardia present.     Heart sounds: No murmur. No friction rub. No gallop.   Pulmonary:     Effort: Pulmonary effort is normal. No respiratory distress.     Breath sounds: No stridor. No wheezing, rhonchi or rales.  Chest:     Chest wall: No tenderness.  Abdominal:     General: There is no distension.     Palpations: Abdomen is soft. There is no mass.     Tenderness: There is abdominal tenderness. There is no right CVA tenderness, left CVA tenderness, guarding or rebound.     Hernia: No hernia is present.     Comments: Abdomen is soft and  diffusely tender throughout.  No focal tenderness.  No distention.  No rebound or guarding.  Foley catheter in place  Musculoskeletal:     Right lower leg: No edema.     Left lower leg: No edema.  Skin:    General: Skin is warm.     Capillary Refill: Capillary refill takes more than 3 seconds.     Findings: No rash.  Neurological:     Mental Status: She is alert.  Psychiatric:        Behavior: Behavior normal.      ED Treatments / Results  Labs (all labs ordered are listed, but only abnormal results are displayed) Labs Reviewed  CBC WITH DIFFERENTIAL/PLATELET - Abnormal; Notable for the following components:      Result Value   WBC 15.3 (*)    Neutro Abs 13.2 (*)    All other components within normal limits  COMPREHENSIVE METABOLIC PANEL - Abnormal; Notable for the following components:   CO2 21 (*)    All other components within normal limits  URINALYSIS, ROUTINE W REFLEX MICROSCOPIC - Abnormal; Notable for the following components:   APPearance CLOUDY (*)    Hgb urine dipstick LARGE (*)    Ketones, ur 80 (*)    Protein, ur >=300 (*)    Leukocytes,Ua MODERATE (*)    RBC / HPF >50 (*)    WBC, UA >50 (*)    Bacteria, UA RARE (*)    All other components within normal limits  URINALYSIS, ROUTINE W REFLEX MICROSCOPIC - Abnormal; Notable for the following components:   Color, Urine STRAW (*)    Glucose, UA >=500 (*)    Hgb urine dipstick SMALL (*)    Ketones, ur 80 (*)    Protein, ur 30 (*)    Leukocytes,Ua SMALL (*)    Bacteria, UA MANY (*)    All other components within normal limits  CBG MONITORING, ED - Abnormal; Notable for the following components:   Glucose-Capillary 33 (*)    All other components within normal limits  CBG MONITORING, ED - Abnormal; Notable for the following components:   Glucose-Capillary 160 (*)    All other components within normal limits  CBG MONITORING, ED - Abnormal; Notable for the following components:   Glucose-Capillary 154 (*)     All other components within normal limits  CBG MONITORING, ED - Abnormal; Notable for the following components:   Glucose-Capillary 182 (*)    All other components within normal limits  URINE CULTURE  URINE CULTURE  LIPASE, BLOOD  BLOOD GAS, ARTERIAL  CBG MONITORING, ED  CBG MONITORING, ED    EKG None  Radiology Ct Abdomen Pelvis W Contrast  Result Date: 10/04/2018 CLINICAL DATA:  28 year old female with acute abdominal and pelvic pain for 2 days. EXAM: CT ABDOMEN AND PELVIS WITH CONTRAST TECHNIQUE: Multidetector CT imaging of the abdomen and pelvis was performed using the standard protocol following bolus administration of intravenous contrast. CONTRAST:  133m OMNIPAQUE IOHEXOL 300 MG/ML  SOLN COMPARISON:  11/26/2017 CT and prior studies FINDINGS: Lower chest: No acute abnormality. Hepatobiliary: Very subtle hypodense lesions within both the RIGHT and LEFT lobes are unchanged from 10/23/2016. No new hepatic abnormalities are present. No biliary dilatation. The patient is status post cholecystectomy. Pancreas: Unremarkable Spleen: Unremarkable Adrenals/Urinary Tract: New severe RIGHT hydroureteronephrosis to the bladder is noted without definite obstructing cause identified. Wedge-shaped areas of decreased RIGHT renal enhancement noted and of uncertain chronicity but may represent infection/pyelonephritis. The bladder is distended with mild focal areas of bladder wall thickening. The LEFT kidney and adrenal glands are unremarkable. Stomach/Bowel: Stomach is within normal limits. No evidence of bowel wall thickening, distention, or inflammatory changes. Vascular/Lymphatic: No significant vascular findings are present. No enlarged abdominal or pelvic lymph nodes. Reproductive: Uterus and bilateral adnexa are unremarkable. Other: No ascites, focal collection or pneumoperitoneum. Musculoskeletal: No acute or suspicious bony abnormalities are identified. IMPRESSION:  1. Severe RIGHT  hydroureteronephrosis to a distended bladder with mild focal areas of bladder wall thickening, of uncertain chronicity but new since 11/26/2017. Dilated RIGHT ureter extends to the bladder without obstructing cause identified. Urology consultation is recommended. 2. Wedge shaped areas of decreased attenuation within the RIGHT kidney of uncertain chronicity but pyelonephritis is not excluded. No evidence of renal abscess. Electronically Signed   By: Margarette Canada M.D.   On: 10/04/2018 16:26    Procedures Procedures (including critical care time)  Medications Ordered in ED Medications  scopolamine (TRANSDERM-SCOP) 1 MG/3DAYS 1.5 mg (1.5 mg Transdermal Patch Applied 10/05/18 2324)  sodium chloride 0.9 % bolus 1,000 mL (0 mLs Intravenous Stopped 10/06/18 0232)  haloperidol lactate (HALDOL) injection 5 mg (5 mg Intramuscular Given 10/05/18 2319)  promethazine (PHENERGAN) injection 25 mg (25 mg Intravenous Given 10/06/18 0126)  sodium chloride 0.9 % bolus 1,000 mL (0 mLs Intravenous Stopped 10/06/18 0345)  ketorolac (TORADOL) 30 MG/ML injection 30 mg (30 mg Intravenous Given 10/06/18 0235)  dicyclomine (BENTYL) injection 20 mg (20 mg Intramuscular Given 10/06/18 0234)  morphine 4 MG/ML injection 4 mg (4 mg Intravenous Given 10/06/18 0429)  ondansetron (ZOFRAN) injection 4 mg (4 mg Intravenous Given 10/06/18 0429)  dextrose 50 % solution 50 mL (50 mLs Intravenous Given 10/06/18 0428)     Initial Impression / Assessment and Plan / ED Course  I have reviewed the triage vital signs and the nursing notes.  Pertinent labs & imaging results that were available during my care of the patient were reviewed by me and considered in my medical decision making (see chart for details).        28 year old female with a history of diabetes mellitus type 1, diabetic gastroparesis, and recurrent UTI on prophylactic antibiotics who presents to the emergency department with intractable nausea and vomiting as well as  generalized abdominal pain that the patient describes as similar to prior episodes of gastroparesis.  The patient was discharged from the hospital approximately 3 hours prior to arrival in the ER.   Mildly tachycardic on arrival, but progressively tachycardic into the 120s.  She appears uncomfortable.  She has a leukocytosis of 15, improved from previous.  No electrolyte derangements.  Of note, glucose is 93, but the patient has already received 1 L of fluids while labs were pending and second liter is in progress.  On point-of-care recheck, glucose is 33, but rebounded with 1 amp of D50.  CT scan from 915 has been reviewed and patient has Foley catheter in place.  Repeat imaging is not indicated at this time as she has no focal tenderness and exam is overly concerning for gastroparesis.  Despite multiple antiemetics, patient continued to have multiple episodes of vomiting.  Pain was initially well controlled with Haldol.  In addition to the patient continuing multiple episodes of vomiting, she remains persistently tachycardic in the 110s.  Consult to the hospitalist team and Dr. Alcario Drought will admit the patient for intractable vomiting. The patient appears reasonably stabilized for admission considering the current resources, flow, and capabilities available in the ED at this time, and I doubt any other [facility] requiring further screening and/or treatment in the ED prior to admission.   Final Clinical Impressions(s) / ED Diagnoses   Final diagnoses:  Intractable vomiting with nausea, unspecified vomiting type    ED Discharge Orders    None       Joanne Gavel, PA-C 10/06/18 0929    Palumbo, April, MD 10/06/18 2354

## 2018-10-05 NOTE — Plan of Care (Signed)

## 2018-10-05 NOTE — ED Notes (Signed)
Per Chart Review, Pt was just discharged from Inpatient around noon.

## 2018-10-05 NOTE — Progress Notes (Signed)
Subjective: Patient reports emesis greatly improved. Foley in place with minimal bother and adequate urine output.   Objective: Vital signs in last 24 hours: Temp:  [98.1 F (36.7 C)-98.4 F (36.9 C)] 98.4 F (36.9 C) (09/16 0435) Pulse Rate:  [92-119] 92 (09/16 0435) Resp:  [14-20] 14 (09/16 0435) BP: (100-143)/(63-122) 104/64 (09/16 0435) SpO2:  [98 %-100 %] 99 % (09/16 0435) Weight:  [61.6 kg] 61.6 kg (09/15 2302)  Intake/Output from previous day: 09/15 0701 - 09/16 0700 In: 595.7 [P.O.:60; I.V.:435.7; IV Piggyback:100] Out: 900 [Urine:900] Intake/Output this shift: No intake/output data recorded.  Physical Exam:  General:alert, no acute distress GI: soft, non tender, non distended GU: Foley in place draining light yellow urine  Lab Results: Recent Labs    10/04/18 0033 10/04/18 1351 10/05/18 0027  HGB 13.8 13.8 11.7*  HCT 42.7 43.0 36.6   BMET Recent Labs    10/04/18 1351 10/05/18 0027  NA 141 136  K 3.5 3.4*  CL 108 106  CO2 20* 20*  GLUCOSE 110* 149*  BUN 27* 19  CREATININE 1.02* 0.64  CALCIUM 9.9 8.4*   No results for input(s): LABPT, INR in the last 72 hours. No results for input(s): LABURIN in the last 72 hours. Results for orders placed or performed during the hospital encounter of 12/16/17  Urine culture     Status: Abnormal   Collection Time: 12/16/17  8:30 AM   Specimen: Urine, Clean Catch  Result Value Ref Range Status   Specimen Description   Final    URINE, CLEAN CATCH Performed at [facility], [place] [address], [place], [place] [address]    Special Requests   Final    NONE Performed at [facility], [place] [address], [place], [place] [address]    Culture (A)  Final    10,000 COLONIES/mL DIPHTHEROIDS(CORYNEBACTERIUM SPECIES)   Report Status 12/17/2017 FINAL  Final    Studies/Results: Ct Abdomen Pelvis W Contrast  Result Date: 10/04/2018 CLINICAL DATA:  28 year old female with acute abdominal  and pelvic pain for 2 days. EXAM: CT ABDOMEN AND PELVIS WITH CONTRAST TECHNIQUE: Multidetector CT imaging of the abdomen and pelvis was performed using the standard protocol following bolus administration of intravenous contrast. CONTRAST:  166mL OMNIPAQUE IOHEXOL 300 MG/ML  SOLN COMPARISON:  11/26/2017 CT and prior studies FINDINGS: Lower chest: No acute abnormality. Hepatobiliary: Very subtle hypodense lesions within both the RIGHT and LEFT lobes are unchanged from 10/23/2016. No new hepatic abnormalities are present. No biliary dilatation. The patient is status post cholecystectomy. Pancreas: Unremarkable Spleen: Unremarkable Adrenals/Urinary Tract: New severe RIGHT hydroureteronephrosis to the bladder is noted without definite obstructing cause identified. Wedge-shaped areas of decreased RIGHT renal enhancement noted and of uncertain chronicity but may represent infection/pyelonephritis. The bladder is distended with mild focal areas of bladder wall thickening. The LEFT kidney and adrenal glands are unremarkable. Stomach/Bowel: Stomach is within normal limits. No evidence of bowel wall thickening, distention, or inflammatory changes. Vascular/Lymphatic: No significant vascular findings are present. No enlarged abdominal or pelvic lymph nodes. Reproductive: Uterus and bilateral adnexa are unremarkable. Other: No ascites, focal collection or pneumoperitoneum. Musculoskeletal: No acute or suspicious bony abnormalities are identified. IMPRESSION: 1. Severe RIGHT hydroureteronephrosis to a distended bladder with mild focal areas of bladder wall thickening, of uncertain chronicity but new since 11/26/2017. Dilated RIGHT ureter extends to the bladder without obstructing cause identified. Urology consultation is recommended. 2. Wedge shaped areas of decreased attenuation within the RIGHT kidney of uncertain chronicity but  pyelonephritis is not excluded. No evidence of renal abscess. Electronically Signed   By: Margarette Canada M.D.   On: 10/04/2018 16:26   Impression/Recommendation: 28 yo F with history of hydronephrosis presenting with CT finding of right hydroureteronephrosis and severely distended bladder in setting of recurrent emesis likely secondary to her gastroparesis/cyclic vomiting syndrome.  Her CT finding likely represents chronic hydronephrosis secondary to severely distended bladder, which has been seen on prior scans. She is already followed by urology clinic. There is no obstructing lesion or stone, and ureteral stenting is not indicated at this time. UA is reassuring and low suspicion for UTI as source of symptoms.   Foley catheter now in place and draining well.   We have scheduled patient for appointment in urology clinic at 9 am on 9/24 with APP; we will obtain renal ultrasound at that visit and make treatment plan regarding catheter removal pending those results in urology clinic.   Please discharge patient with Foley catheter in place to drainage. Discussed with patient and she agrees with plan.     LOS: 0 days   Haskel Schroeder 10/05/2018, 8:21 AM

## 2018-10-05 NOTE — Progress Notes (Signed)
Patient has insulin pump and was told about the policy of having a pump and she signed and it was added to chart.   Patient was taught on how to do foley care daily and how to switch out the leg bag to the Foley at night time.

## 2018-10-05 NOTE — Discharge Summary (Signed)
Physician Discharge Summary  [name] XNT:[id number] DOB: Apr 23, 1990 DOA: 10/04/2018  PCP: Vicenta Aly, FNP  Admit date: 10/04/2018 Discharge date: 10/05/2018  Admitted From: Home Disposition:  Home  Recommendations for Outpatient Follow-up:  1. Follow up with PCP in 1 week 2. Follow-up with urology as scheduled on 10/13/2018 at [facility]: No Equipment/Devices: Foley catheter, placed 10/04/2018  Discharge Condition: Stable CODE STATUS: Full code Diet recommendation: Heart Healthy / Carb Modified  History of present illness:  [name] is a 28 y.o. female with medical history significant of type 1 diabetes with gastroparesis, asthma, hypertension, migraine, recurrent UTI on prophylactic antibiotic who presents for concerns of intractable nausea and vomiting and abdominal pain.  Patient reports that yesterday she was otherwise in her normal state of health when she began to have nausea and vomiting.  She often gets these episodes due to her gastroparesis.  She presented to the ED yesterday but was discharged after she was found asleep and no longer had nausea or vomiting.  However after she got home she began to have similar symptoms again.  She improved some with Zofran of regulation at home but continued of symptoms and decided to come into the ED.  She denies any fevers or chills.  Denies any chest pain, shortness of breath. Has diffuse abdominal pain. Denies any dysuria, increased frequency of urgency.  She denies any tobacco or alcohol use.  Endorses marijuana use last used a week ago.  ED Course: She was afebrile and normotensive with blood pressure 130 over 80s.  Patient was mildly tachycardic up to 110.  CBC showed leukocytosis of 20 and elevated platelets of 458.  BMP shows elevated creatinine of 1.02 from a 0.75 yesterday.  hCG was negative.  CT abdomen showed a severe right hydro-ureteronephrosis to a distended bladder with mild focal area of bladder wall  thickening. Dilated right urethra.  EKG showed normal sinus rhythm.  Hospital course:  Right hydro-ureter nephrosis Bladder distention Patient presenting with abdominal discomfort associated with intractable nausea and vomiting.  CT abdomen/pelvis notable for severe right hydroureteronephrosis with distended bladder.  No obstructing lesion or stone was appreciated.  Patient was evaluated by urology with recommendations of Foley catheter placement.  Patient symptoms have resolved and she is tolerating diet.  Patient will continue Foley catheter and follow-up in the urology clinic outpatient on 10/13/2018 at 9 AM.  Continue home Bactrim for UTI prophylaxis.  Type 1 diabetes mellitus with associated gastroparesis Hemoglobin A1c 8.1 on 11/02/2017.  Continue insulin pump.  Continue Reglan for gastroparesis.  Continue outpatient follow-up.  On ACE inhibitor.  Essential hypertension Continue home lisinopril  GERD: Continue PPI  Discharge Diagnoses:  Principal Problem:   Hydronephrosis of right kidney    Discharge Instructions  Discharge Instructions    Call MD for:  difficulty breathing, headache or visual disturbances   Complete by: As directed    Call MD for:  extreme fatigue   Complete by: As directed    Call MD for:  persistant dizziness or light-headedness   Complete by: As directed    Call MD for:  persistant nausea and vomiting   Complete by: As directed    Call MD for:  severe uncontrolled pain   Complete by: As directed    Call MD for:  temperature >100.4   Complete by: As directed    Diet - low sodium heart healthy   Complete by: As directed    Increase activity slowly   Complete by:  As directed      Allergies as of 10/05/2018      Reactions   Peanut-containing Drug Products Swelling, Other (See Comments)   Reaction:  Swelling of mouth and lips    Food Swelling, Other (See Comments)   Pt is allergic to strawberries.   Reaction:  Swelling of mouth and lips    Ultram  [tramadol] Itching      Medication List    STOP taking these medications   erythromycin ethylsuccinate 200 MG/5ML suspension Commonly known as: EES     TAKE these medications   albuterol 108 (90 Base) MCG/ACT inhaler Commonly known as: VENTOLIN HFA Inhale 1-2 puffs into the lungs every 6 (six) hours as needed for wheezing or shortness of breath.   blood glucose meter kit and supplies Dispense based on patient and insurance preference. Use up to four times daily as directed. (FOR ICD-10 E10.9, E11.9).   glucagon 1 MG injection Commonly known as: Glucagon Emergency Inject 1 mg into the muscle once as needed (For very low blood sugars. May repeat in 15-20 minutes if needed.).   Insulin Pen Needle 31G X 5 MM Misc 30 Units by Does not apply route at bedtime.   insulin pump Soln Inject 1 each into the skin continuous. Novolog insulin. Pt puts in her insulin pump: Medtronic 630G. Carb ratio 1-10, insulin sensitive 66. 1.10 from 12 to 12. Every 3 days needs to be replaced.   lisinopril 2.5 MG tablet Commonly known as: ZESTRIL Take 2.5 mg by mouth daily.   metoCLOPramide 10 MG tablet Commonly known as: REGLAN Take 1 tablet (10 mg total) by mouth 3 (three) times daily with meals. What changed:   when to take this  reasons to take this   ondansetron 4 MG disintegrating tablet Commonly known as: Zofran ODT Take 1 tablet (4 mg total) by mouth every 8 (eight) hours as needed for nausea or vomiting.   pantoprazole 40 MG tablet Commonly known as: PROTONIX Take 1 tablet (40 mg total) by mouth daily.   promethazine 25 MG suppository Commonly known as: PHENERGAN Place 1 suppository (25 mg total) rectally every 6 (six) hours as needed for nausea or vomiting.   scopolamine 1 MG/3DAYS Commonly known as: TRANSDERM-SCOP Place 1 patch (1.5 mg total) onto the skin every 3 (three) days as needed (during episodes of vomiting).   trimethoprim 100 MG tablet Commonly known as:  TRIMPEX Take 100 mg by mouth daily. UTI prophylaxis.      Follow-up Information    Vicenta Aly, [place]. Schedule an appointment as soon as possible for a visit in 1 week(s).   Specialty: Nurse Practitioner Contact information: [place] [address] [phone]        [facility] Follow up on 10/13/2018.   Why: 0900 Contact information: [place] [phone]         Allergies  Allergen Reactions  . Peanut-Containing Drug Products Swelling and Other (See Comments)    Reaction:  Swelling of mouth and lips   . 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   Reaction:  Swelling of mouth and lips   . Ultram [Tramadol] Itching    Consultations:  Neurology, Dr. Kerrie Pleasure   Procedures/Studies: Ct Abdomen Pelvis W Contrast  Result Date: 10/04/2018 CLINICAL DATA:  28 year old female with acute abdominal and pelvic pain for 2 days. EXAM: CT ABDOMEN AND  PELVIS WITH CONTRAST TECHNIQUE: Multidetector CT imaging of the abdomen and pelvis was performed using the standard protocol following bolus administration of intravenous contrast. CONTRAST:  1108m OMNIPAQUE IOHEXOL 300 MG/ML  SOLN COMPARISON:  11/26/2017 CT and prior studies FINDINGS: Lower chest: No acute abnormality. Hepatobiliary: Very subtle hypodense lesions within both the RIGHT and LEFT lobes are unchanged from 10/23/2016. No new hepatic abnormalities are present. No biliary dilatation. The patient is status post cholecystectomy. Pancreas: Unremarkable Spleen: Unremarkable Adrenals/Urinary Tract: New severe RIGHT hydroureteronephrosis to the bladder is noted without definite obstructing cause identified. Wedge-shaped areas of decreased RIGHT renal enhancement noted and of uncertain chronicity but may represent infection/pyelonephritis. The bladder is distended with mild focal areas of bladder wall  thickening. The LEFT kidney and adrenal glands are unremarkable. Stomach/Bowel: Stomach is within normal limits. No evidence of bowel wall thickening, distention, or inflammatory changes. Vascular/Lymphatic: No significant vascular findings are present. No enlarged abdominal or pelvic lymph nodes. Reproductive: Uterus and bilateral adnexa are unremarkable. Other: No ascites, focal collection or pneumoperitoneum. Musculoskeletal: No acute or suspicious bony abnormalities are identified. IMPRESSION: 1. Severe RIGHT hydroureteronephrosis to a distended bladder with mild focal areas of bladder wall thickening, of uncertain chronicity but new since 11/26/2017. Dilated RIGHT ureter extends to the bladder without obstructing cause identified. Urology consultation is recommended. 2. Wedge shaped areas of decreased attenuation within the RIGHT kidney of uncertain chronicity but pyelonephritis is not excluded. No evidence of renal abscess. Electronically Signed   By: JMargarette CanadaM.D.   On: 10/04/2018 16:26      Subjective: Patient seen and examined at bedside, resting comfortably.  Tolerating diet without any further nausea/vomiting.  Patient seen by urology this morning and okay for discharge home with Foley catheter in place.  No other complaints from patient this morning.  Denies headache, fever/chills/night sweats, no nausea vomitus or diarrhea, no chest pain, no palpitations, no shortness of breath, no abdominal pain, no weakness, no paresthesias.  No acute events overnight per nurse staff.   Discharge Exam: Vitals:   10/04/18 2141 10/05/18 0435  BP: 120/77 104/64  Pulse: (!) 101 92  Resp: 18 14  Temp: 98.1 F (36.7 C) 98.4 F (36.9 C)  SpO2: 98% 99%   Vitals:   10/04/18 2030 10/04/18 2141 10/04/18 2302 10/05/18 0435  BP: 100/76 120/77  104/64  Pulse: 98 (!) 101  92  Resp: _0 Temp:  98.1 F (36.7 C)  98.4 F (36.9 C)  TempSrc:  Oral  Oral  SpO2: 100% 98%  99%  Weight:   61.6 kg    Height:   _1  (1.6 m)     General: Pt is alert, awake, not in acute distress Cardiovascular: RRR, S1/S2 +, no rubs, no gallops Respiratory: CTA bilaterally, no wheezing, no rhonchi Abdominal: Soft, NT, ND, bowel sounds +, Foley catheter noted in place draining clear yellow urine Extremities: no edema, no cyanosis    The results of significant diagnostics from this hospitalization (including imaging, microbiology, ancillary and laboratory) are listed below for reference.     Microbiology: No results found for this or any previous visit (from the past 240 hour(s)).   Labs: BNP (last 3 results) No results for input(s): BNP in the last 8760 hours. Basic Metabolic Panel: Recent Labs  Lab 10/04/18 0033 10/04/18 1351 10/05/18 0027  NA 140 141 136  K 4.1 3.5 3.4*  CL 108 108 106  CO2 20* 20* 20*  GLUCOSE 279* 110* 149*  BUN 18 27* 19  CREATININE 0.75 1.02* 0.64  CALCIUM 9.2 9.9 8.4*   Liver Function Tests: Recent Labs  Lab 10/04/18 0033 10/04/18 1351  AST 22 19  ALT 13 16  ALKPHOS 99 121  BILITOT 1.2 0.8  PROT 8.5* 9.8*  ALBUMIN 4.4 5.4*   Recent Labs  Lab 10/04/18 0033 10/04/18 1351  LIPASE 19 20   No results for input(s): AMMONIA in the last 168 hours. CBC: Recent Labs  Lab 10/04/18 0033 10/04/18 1351 10/05/18 0027  WBC 20.0* 20.1* 16.8*  NEUTROABS  --  18.0*  --   HGB 13.8 13.8 11.7*  HCT 42.7 43.0 36.6  MCV 88.8 89.2 91.0  PLT 235 458* 342   Cardiac Enzymes: No results for input(s): CKTOTAL, CKMB, CKMBINDEX, TROPONINI in the last 168 hours. BNP: Invalid input(s): POCBNP CBG: Recent Labs  Lab 10/03/18 2104 10/04/18 1055 10/04/18 2218 10/05/18 0803  GLUCAP 139* 194* 58* 93   D-Dimer No results for input(s): DDIMER in the last 72 hours. Hgb A1c No results for input(s): HGBA1C in the last 72 hours. Lipid Profile No results for input(s): CHOL, HDL, LDLCALC, TRIG, CHOLHDL, LDLDIRECT in the last 72 hours. Thyroid function studies No  results for input(s): TSH, T4TOTAL, T3FREE, THYROIDAB in the last 72 hours.  Invalid input(s): FREET3 Anemia work up No results for input(s): VITAMINB12, FOLATE, FERRITIN, TIBC, IRON, RETICCTPCT in the last 72 hours. Urinalysis    Component Value Date/Time   COLORURINE YELLOW 10/04/2018 1645   APPEARANCEUR CLOUDY (A) 10/04/2018 1645   LABSPEC 1.032 (H) 10/04/2018 1645   PHURINE 6.0 10/04/2018 1645   GLUCOSEU >=500 (A) 10/04/2018 1645   GLUCOSEU >=1000 11/07/2012 1205   HGBUR LARGE (A) 10/04/2018 [place] 10/04/2018 1645   KETONESUR 80 (A) 10/04/2018 1645   PROTEINUR 100 (A) 10/04/2018 1645   UROBILINOGEN 0.2 11/24/2014 1045   NITRITE NEGATIVE 10/04/2018 1645   LEUKOCYTESUR MODERATE (A) 10/04/2018 1645   Sepsis Labs Invalid input(s): PROCALCITONIN,  WBC,  LACTICIDVEN Microbiology No results found for this or any previous visit (from the past 240 hour(s)).   Time coordinating discharge: Over 30 minutes  SIGNED:   Donnamarie Poag [place] DO  [facility] 10/05/2018, 8:41 AM

## 2018-10-05 NOTE — ED Triage Notes (Signed)
Pt reports N/V and abdominal pain. Patient is throwing herself in the floor in the lobby and writhing around. Patient able to be redirected and explained that there are no available rooms in the back.   Patient has been seen for the same x2 days

## 2018-10-06 ENCOUNTER — Observation Stay (HOSPITAL_COMMUNITY): Payer: Medicaid Other

## 2018-10-06 ENCOUNTER — Encounter (HOSPITAL_COMMUNITY): Payer: Self-pay

## 2018-10-06 DIAGNOSIS — Z9641 Presence of insulin pump (external) (internal): Secondary | ICD-10-CM | POA: Diagnosis present

## 2018-10-06 DIAGNOSIS — E10649 Type 1 diabetes mellitus with hypoglycemia without coma: Secondary | ICD-10-CM | POA: Diagnosis not present

## 2018-10-06 DIAGNOSIS — Z8349 Family history of other endocrine, nutritional and metabolic diseases: Secondary | ICD-10-CM | POA: Diagnosis not present

## 2018-10-06 DIAGNOSIS — N10 Acute tubulo-interstitial nephritis: Secondary | ICD-10-CM | POA: Diagnosis present

## 2018-10-06 DIAGNOSIS — N1330 Unspecified hydronephrosis: Secondary | ICD-10-CM | POA: Diagnosis present

## 2018-10-06 DIAGNOSIS — Z9049 Acquired absence of other specified parts of digestive tract: Secondary | ICD-10-CM | POA: Diagnosis not present

## 2018-10-06 DIAGNOSIS — Z20828 Contact with and (suspected) exposure to other viral communicable diseases: Secondary | ICD-10-CM | POA: Diagnosis present

## 2018-10-06 DIAGNOSIS — Z91018 Allergy to other foods: Secondary | ICD-10-CM | POA: Diagnosis not present

## 2018-10-06 DIAGNOSIS — I10 Essential (primary) hypertension: Secondary | ICD-10-CM | POA: Diagnosis present

## 2018-10-06 DIAGNOSIS — E1043 Type 1 diabetes mellitus with diabetic autonomic (poly)neuropathy: Secondary | ICD-10-CM | POA: Diagnosis present

## 2018-10-06 DIAGNOSIS — Z794 Long term (current) use of insulin: Secondary | ICD-10-CM | POA: Diagnosis not present

## 2018-10-06 DIAGNOSIS — Z23 Encounter for immunization: Secondary | ICD-10-CM | POA: Diagnosis not present

## 2018-10-06 DIAGNOSIS — Z885 Allergy status to narcotic agent status: Secondary | ICD-10-CM | POA: Diagnosis not present

## 2018-10-06 DIAGNOSIS — N111 Chronic obstructive pyelonephritis: Secondary | ICD-10-CM | POA: Diagnosis present

## 2018-10-06 DIAGNOSIS — E876 Hypokalemia: Secondary | ICD-10-CM | POA: Diagnosis present

## 2018-10-06 DIAGNOSIS — Z8249 Family history of ischemic heart disease and other diseases of the circulatory system: Secondary | ICD-10-CM | POA: Diagnosis not present

## 2018-10-06 DIAGNOSIS — Z833 Family history of diabetes mellitus: Secondary | ICD-10-CM | POA: Diagnosis not present

## 2018-10-06 DIAGNOSIS — K3184 Gastroparesis: Secondary | ICD-10-CM | POA: Diagnosis present

## 2018-10-06 DIAGNOSIS — E1065 Type 1 diabetes mellitus with hyperglycemia: Secondary | ICD-10-CM | POA: Diagnosis present

## 2018-10-06 DIAGNOSIS — E1143 Type 2 diabetes mellitus with diabetic autonomic (poly)neuropathy: Secondary | ICD-10-CM | POA: Diagnosis not present

## 2018-10-06 DIAGNOSIS — R112 Nausea with vomiting, unspecified: Secondary | ICD-10-CM

## 2018-10-06 DIAGNOSIS — K219 Gastro-esophageal reflux disease without esophagitis: Secondary | ICD-10-CM | POA: Diagnosis present

## 2018-10-06 DIAGNOSIS — R000 Tachycardia, unspecified: Secondary | ICD-10-CM | POA: Diagnosis present

## 2018-10-06 DIAGNOSIS — Z79899 Other long term (current) drug therapy: Secondary | ICD-10-CM | POA: Diagnosis not present

## 2018-10-06 DIAGNOSIS — E860 Dehydration: Secondary | ICD-10-CM | POA: Diagnosis present

## 2018-10-06 DIAGNOSIS — Z809 Family history of malignant neoplasm, unspecified: Secondary | ICD-10-CM | POA: Diagnosis not present

## 2018-10-06 LAB — RENAL FUNCTION PANEL
Albumin: 3.6 g/dL (ref 3.5–5.0)
Anion gap: 8 (ref 5–15)
BUN: 9 mg/dL (ref 6–20)
CO2: 22 mmol/L (ref 22–32)
Calcium: 8.5 mg/dL — ABNORMAL LOW (ref 8.9–10.3)
Chloride: 109 mmol/L (ref 98–111)
Creatinine, Ser: 0.52 mg/dL (ref 0.44–1.00)
GFR calc Af Amer: >60 mL/min (ref >60)
GFR calc non Af Amer: >60 mL/min (ref >60)
Glucose, Bld: 66 mg/dL — ABNORMAL LOW (ref 70–99)
Phosphorus: 2.1 mg/dL — ABNORMAL LOW (ref 2.5–4.6)
Potassium: 3.8 mmol/L (ref 3.5–5.1)
Sodium: 139 mmol/L (ref 135–145)

## 2018-10-06 LAB — CBC WITH DIFFERENTIAL/PLATELET
Abs Immature Granulocytes: 0.03 10*3/uL (ref 0.00–0.07)
Abs Immature Granulocytes: 0.04 10*3/uL (ref 0.00–0.07)
Basophils Absolute: 0 10*3/uL (ref 0.0–0.1)
Basophils Absolute: 0 10*3/uL (ref 0.0–0.1)
Basophils Relative: 0 %
Basophils Relative: 0 %
Eosinophils Absolute: 0 10*3/uL (ref 0.0–0.5)
Eosinophils Absolute: 0.1 10*3/uL (ref 0.0–0.5)
Eosinophils Relative: 0 %
Eosinophils Relative: 1 %
HCT: 40.9 % (ref 36.0–46.0)
HCT: 35.4 % — ABNORMAL LOW (ref 36.0–46.0)
Hemoglobin: 13.3 g/dL (ref 12.0–15.0)
Hemoglobin: 11.7 g/dL — ABNORMAL LOW (ref 12.0–15.0)
Immature Granulocytes: 0 %
Immature Granulocytes: 0 %
Lymphocytes Relative: 9 %
Lymphocytes Relative: 17 %
Lymphs Abs: 1.3 10*3/uL (ref 0.7–4.0)
Lymphs Abs: 1.7 10*3/uL (ref 0.7–4.0)
MCH: 29 pg (ref 26.0–34.0)
MCH: 28.6 pg (ref 26.0–34.0)
MCHC: 32.5 g/dL (ref 30.0–36.0)
MCHC: 33.1 g/dL (ref 30.0–36.0)
MCV: 89.3 fL (ref 80.0–100.0)
MCV: 86.6 fL (ref 80.0–100.0)
Monocytes Absolute: 0.7 10*3/uL (ref 0.1–1.0)
Monocytes Absolute: 0.8 10*3/uL (ref 0.1–1.0)
Monocytes Relative: 4 %
Monocytes Relative: 8 %
Neutro Abs: 13.2 10*3/uL — ABNORMAL HIGH (ref 1.7–7.7)
Neutro Abs: 7.4 10*3/uL (ref 1.7–7.7)
Neutrophils Relative %: 87 %
Neutrophils Relative %: 74 %
Platelets: 337 10*3/uL (ref 150–400)
Platelets: 326 10*3/uL (ref 150–400)
RBC: 4.58 MIL/uL (ref 3.87–5.11)
RBC: 4.09 MIL/uL (ref 3.87–5.11)
RDW: 13.2 % (ref 11.5–15.5)
RDW: 13.1 % (ref 11.5–15.5)
WBC: 15.3 10*3/uL — ABNORMAL HIGH (ref 4.0–10.5)
WBC: 10 10*3/uL (ref 4.0–10.5)
nRBC: 0 % (ref 0.0–0.2)
nRBC: 0 % (ref 0.0–0.2)

## 2018-10-06 LAB — COMPREHENSIVE METABOLIC PANEL
ALT: 19 U/L (ref 0–44)
AST: 29 U/L (ref 15–41)
Albumin: 4.3 g/dL (ref 3.5–5.0)
Alkaline Phosphatase: 89 U/L (ref 38–126)
Anion gap: 15 (ref 5–15)
BUN: 14 mg/dL (ref 6–20)
CO2: 21 mmol/L — ABNORMAL LOW (ref 22–32)
Calcium: 9.2 mg/dL (ref 8.9–10.3)
Chloride: 104 mmol/L (ref 98–111)
Creatinine, Ser: 0.63 mg/dL (ref 0.44–1.00)
GFR calc Af Amer: >60 mL/min (ref >60)
GFR calc non Af Amer: >60 mL/min (ref >60)
Glucose, Bld: 93 mg/dL (ref 70–99)
Potassium: 3.6 mmol/L (ref 3.5–5.1)
Sodium: 140 mmol/L (ref 135–145)
Total Bilirubin: 0.7 mg/dL (ref 0.3–1.2)
Total Protein: 7.7 g/dL (ref 6.5–8.1)

## 2018-10-06 LAB — URINALYSIS, ROUTINE W REFLEX MICROSCOPIC
Bilirubin Urine: NEGATIVE
Bilirubin Urine: NEGATIVE
Glucose, UA: NEGATIVE mg/dL
Glucose, UA: >=500 mg/dL — AB
Ketones, ur: 80 mg/dL — AB
Ketones, ur: 80 mg/dL — AB
Nitrite: NEGATIVE
Nitrite: NEGATIVE
Protein, ur: >=300 mg/dL — AB
Protein, ur: 30 mg/dL — AB
RBC / HPF: >50 RBC/hpf — ABNORMAL HIGH (ref 0–5)
Specific Gravity, Urine: 1.025 (ref 1.005–1.030)
Specific Gravity, Urine: 1.014 (ref 1.005–1.030)
WBC, UA: >50 WBC/hpf — ABNORMAL HIGH (ref 0–5)
pH: 5 (ref 5.0–8.0)
pH: 6 (ref 5.0–8.0)

## 2018-10-06 LAB — GLUCOSE, CAPILLARY
Glucose-Capillary: 33 mg/dL — CL (ref 70–99)
Glucose-Capillary: 124 mg/dL — ABNORMAL HIGH (ref 70–99)
Glucose-Capillary: 85 mg/dL (ref 70–99)
Glucose-Capillary: 162 mg/dL — ABNORMAL HIGH (ref 70–99)

## 2018-10-06 LAB — CBG MONITORING, ED
Glucose-Capillary: 154 mg/dL — ABNORMAL HIGH (ref 70–99)
Glucose-Capillary: 160 mg/dL — ABNORMAL HIGH (ref 70–99)
Glucose-Capillary: 182 mg/dL — ABNORMAL HIGH (ref 70–99)
Glucose-Capillary: 33 mg/dL — CL (ref 70–99)
Glucose-Capillary: 78 mg/dL (ref 70–99)

## 2018-10-06 LAB — BLOOD GAS, ARTERIAL
Acid-base deficit: 1.2 mmol/L (ref 0.0–2.0)
Bicarbonate: 22.9 mmol/L (ref 20.0–28.0)
Drawn by: 225631
FIO2: 21
O2 Saturation: 97 %
Patient temperature: 98.6
pCO2 arterial: 38 mmHg (ref 32.0–48.0)
pH, Arterial: 7.396 (ref 7.350–7.450)
pO2, Arterial: 89.8 mmHg (ref 83.0–108.0)

## 2018-10-06 LAB — MAGNESIUM: Magnesium: 1.7 mg/dL (ref 1.7–2.4)

## 2018-10-06 LAB — LIPASE, BLOOD: Lipase: 13 U/L (ref 11–51)

## 2018-10-06 MED ORDER — ONDANSETRON HCL 4 MG/2ML IJ SOLN
4.0000 mg | Freq: Once | INTRAMUSCULAR | Status: AC
  Administered 2018-10-06: 04:00:00 4 mg via INTRAVENOUS
  Filled 2018-10-06: qty 2

## 2018-10-06 MED ORDER — INSULIN ASPART 100 UNIT/ML ~~LOC~~ SOLN: 0.0000 [IU] | Freq: Every day | SUBCUTANEOUS | Status: DC

## 2018-10-06 MED ORDER — INFLUENZA VAC SPLIT QUAD 0.5 ML IM SUSY
0.5000 mL | PREFILLED_SYRINGE | INTRAMUSCULAR | Status: AC
  Administered 2018-10-07: 0.5 mL via INTRAMUSCULAR
  Filled 2018-10-06 (×2): qty 0.5

## 2018-10-06 MED ORDER — PANTOPRAZOLE SODIUM 40 MG IV SOLR
40.0000 mg | INTRAVENOUS | Status: DC
  Administered 2018-10-06 – 2018-10-11 (×6): 40 mg via INTRAVENOUS
  Filled 2018-10-06 (×6): qty 40

## 2018-10-06 MED ORDER — KETOROLAC TROMETHAMINE 30 MG/ML IJ SOLN
30.0000 mg | Freq: Once | INTRAMUSCULAR | Status: AC
  Administered 2018-10-06: 30 mg via INTRAVENOUS
  Filled 2018-10-06: qty 1

## 2018-10-06 MED ORDER — MORPHINE SULFATE (PF) 4 MG/ML IV SOLN
4.0000 mg | Freq: Once | INTRAVENOUS | Status: AC
  Administered 2018-10-06: 4 mg via INTRAVENOUS
  Filled 2018-10-06: qty 1

## 2018-10-06 MED ORDER — SODIUM CHLORIDE 0.9 % IV BOLUS
1000.0000 mL | Freq: Once | INTRAVENOUS | Status: AC
  Administered 2018-10-06: 1000 mL via INTRAVENOUS

## 2018-10-06 MED ORDER — DOCUSATE SODIUM 100 MG PO CAPS
100.0000 mg | ORAL_CAPSULE | Freq: Two times a day (BID) | ORAL | Status: DC
  Administered 2018-10-06 – 2018-10-16 (×15): 100 mg via ORAL
  Filled 2018-10-06 (×20): qty 1

## 2018-10-06 MED ORDER — LISINOPRIL 5 MG PO TABS
2.5000 mg | ORAL_TABLET | Freq: Every day | ORAL | Status: DC
  Administered 2018-10-06 – 2018-10-12 (×6): 2.5 mg via ORAL
  Filled 2018-10-06 (×8): qty 1

## 2018-10-06 MED ORDER — ACETAMINOPHEN 325 MG PO TABS
650.0000 mg | ORAL_TABLET | Freq: Four times a day (QID) | ORAL | Status: DC | PRN
  Administered 2018-10-13 – 2018-10-16 (×4): 650 mg via ORAL
  Filled 2018-10-06 (×4): qty 2

## 2018-10-06 MED ORDER — SODIUM CHLORIDE 0.9 % IV SOLN
2.0000 g | INTRAVENOUS | Status: AC
  Administered 2018-10-06 – 2018-10-10 (×5): 2 g via INTRAVENOUS
  Filled 2018-10-06: qty 2
  Filled 2018-10-06: qty 20
  Filled 2018-10-06 (×3): qty 2

## 2018-10-06 MED ORDER — ACETAMINOPHEN 650 MG RE SUPP: 650.0000 mg | Freq: Four times a day (QID) | RECTAL | Status: DC | PRN

## 2018-10-06 MED ORDER — SODIUM CHLORIDE 0.9 % IV SOLN
INTRAVENOUS | Status: DC
  Administered 2018-10-06: 13:00:00 via INTRAVENOUS

## 2018-10-06 MED ORDER — BISACODYL 10 MG RE SUPP: 10.0000 mg | Freq: Every day | RECTAL | Status: DC | PRN

## 2018-10-06 MED ORDER — METOCLOPRAMIDE HCL 5 MG/ML IJ SOLN
10.0000 mg | Freq: Four times a day (QID) | INTRAMUSCULAR | Status: DC
  Administered 2018-10-06 – 2018-10-07 (×4): 10 mg via INTRAVENOUS
  Filled 2018-10-06 (×4): qty 2

## 2018-10-06 MED ORDER — ALBUTEROL SULFATE (2.5 MG/3ML) 0.083% IN NEBU: 2.5000 mg | INHALATION_SOLUTION | Freq: Four times a day (QID) | RESPIRATORY_TRACT | Status: DC | PRN

## 2018-10-06 MED ORDER — DEXTROSE 50 % IV SOLN
1.0000 | Freq: Once | INTRAVENOUS | Status: AC
  Administered 2018-10-06: 50 mL via INTRAVENOUS
  Filled 2018-10-06: qty 50

## 2018-10-06 MED ORDER — ENOXAPARIN SODIUM 40 MG/0.4ML ~~LOC~~ SOLN
40.0000 mg | SUBCUTANEOUS | Status: DC
  Administered 2018-10-06 – 2018-10-15 (×8): 40 mg via SUBCUTANEOUS
  Filled 2018-10-06 (×11): qty 0.4

## 2018-10-06 MED ORDER — INSULIN ASPART 100 UNIT/ML ~~LOC~~ SOLN: 0.0000 [IU] | Freq: Three times a day (TID) | SUBCUTANEOUS | Status: DC

## 2018-10-06 MED ORDER — PROMETHAZINE HCL 25 MG RE SUPP
25.0000 mg | Freq: Four times a day (QID) | RECTAL | Status: DC | PRN
  Administered 2018-10-06 – 2018-10-07 (×2): 25 mg via RECTAL
  Filled 2018-10-06 (×3): qty 1

## 2018-10-06 MED ORDER — ALBUTEROL SULFATE HFA 108 (90 BASE) MCG/ACT IN AERS
1.0000 | INHALATION_SPRAY | Freq: Four times a day (QID) | RESPIRATORY_TRACT | Status: DC | PRN
  Filled 2018-10-06: qty 6.7

## 2018-10-06 MED ORDER — DICYCLOMINE HCL 10 MG/ML IM SOLN
20.0000 mg | Freq: Once | INTRAMUSCULAR | Status: AC
  Administered 2018-10-06: 20 mg via INTRAMUSCULAR
  Filled 2018-10-06: qty 2

## 2018-10-06 MED ORDER — KETOROLAC TROMETHAMINE 15 MG/ML IJ SOLN
15.0000 mg | Freq: Four times a day (QID) | INTRAMUSCULAR | Status: AC | PRN
  Administered 2018-10-06 – 2018-10-10 (×6): 15 mg via INTRAVENOUS
  Filled 2018-10-06 (×7): qty 1

## 2018-10-06 NOTE — Progress Notes (Signed)
CRITICAL VALUE ALERT  Critical Value:  CBG 33  Date & Time Notied: 10/06/18 , 1620  Provider Notified: Dr Marylyn Ishihara  Orders Received/Actions taken: yes

## 2018-10-06 NOTE — ED Notes (Signed)
Gave pt water. She said she was too nauseous to drink it.

## 2018-10-06 NOTE — H&P (Signed)
.  History and Physical    [name] TTS:[id number] DOB: Aug 18, 1990 DOA: 10/05/2018  PCP: Vicenta Aly, FNP  Patient coming from: Home   Chief Complaint: Nausea  HPI: [name] is a 28 y.o. female with medical history significant of DMt1 and gastroparesis. Presents with nausea. Reports this has been ongoing for at least 3 days. She has made multiple visits to the ED without resolution. Her nausea is accompanied by abdominal cramping that is constant in nature. Her normal medications for gastroparesis have not helped. She does not know of anything that is making it worse. She denies any other associate symptoms at this time.     ED Course: She was seen by ED staff and found to be tachycardic with nausea uncontrolled by anti-emetics. TRH was called for admission.   Review of Systems: Denies CP, dyspnea, HA, diarrhea, fevers. Reports cramping, N, V. Remainder of 10 point review of systems is otherwise negative for all not mentioned in HPI.    Past Medical History:  Diagnosis Date  . Anxiety   . Arthritis    "hands, feet, knees" (12/18/2016)  . Asthma   . Diabetic gastroparesis ([place])    Per gastric emptying study 07/09/16 which showed significant delayed gastric emptying.  . Gallstones   . Gastroparesis   . GERD (gastroesophageal reflux disease)   . Heart murmur   . Hepatic steatosis 11/26/2014   and hepatomegaly  . Hypertension    hx (12/18/2016)  . Intractable cyclical vomiting syndrome    Archie Endo 12/18/2016  . Liver mass 11/26/2014  . Pancreatitis, acute 11/26/2014  . Pneumonia    "as a teen X 1" (12/18/2016)  . Type I diabetes mellitus ([place]) 2007   IDDM.  poorly controlled, multiple admits with DKA    Past Surgical History:  Procedure Laterality Date  . CHOLECYSTECTOMY N/A 02/11/2015   Procedure: LAPAROSCOPIC CHOLECYSTECTOMY WITH INTRAOPERATIVE CHOLANGIOGRAM;  Surgeon: Greer Pickerel, MD;  Location: [facility];  Service: General;  Laterality: N/A;  .  ESOPHAGOGASTRODUODENOSCOPY (EGD) WITH PROPOFOL Left 09/20/2014   Procedure: ESOPHAGOGASTRODUODENOSCOPY (EGD) WITH PROPOFOL;  Surgeon: Arta Silence, MD;  Location: [facility] ENDOSCOPY;  Service: Endoscopy;  Laterality: Left;  . WISDOM TOOTH EXTRACTION       reports that she has never smoked. She has never used smokeless tobacco. She reports current drug use. Drug: Marijuana. She reports that she does not drink alcohol.  Allergies  Allergen Reactions  . Peanut-Containing Drug Products Swelling and Other (See Comments)    Reaction:  Swelling of mouth and lips   . 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   Reaction:  Swelling of mouth and lips   . Ultram [Tramadol] Itching    Family History  Problem Relation Age of Onset  . Heart disease Maternal Grandmother   . Heart disease Maternal Grandfather   . Diabetes Mother   . Hyperlipidemia Mother   . Hypertension Father   . Heart disease Father   . Hypertension Paternal Grandmother   . Cancer Paternal Grandfather     Prior to Admission medications   Medication Sig Start Date End Date Taking? Authorizing Provider  albuterol (PROVENTIL HFA;VENTOLIN HFA) 108 (90 Base) MCG/ACT inhaler Inhale 1-2 puffs into the lungs every 6 (six) hours as needed for wheezing or shortness of breath.   Yes [provider]  Insulin Human (INSULIN PUMP) SOLN Inject 1 each into the skin continuous. Novolog insulin. Pt puts in her insulin pump: Medtronic 630G. Carb  ratio 1-10, insulin sensitive 66. 1.10 from 12 to 12. Every 3 days needs to be replaced.   Yes [provider]  lisinopril (ZESTRIL) 2.5 MG tablet Take 2.5 mg by mouth daily.   Yes [provider]  metoCLOPramide (REGLAN) 10 MG tablet Take 1 tablet (10 mg total) by mouth 3 (three) times daily with meals. Patient taking differently: Take 10 mg by mouth 3 (three) times daily as needed for nausea or vomiting.  11/07/17 11/07/18 Yes Debbe Odea, MD   ondansetron (ZOFRAN ODT) 4 MG disintegrating tablet Take 1 tablet (4 mg total) by mouth every 8 (eight) hours as needed for nausea or vomiting. 11/07/17  Yes Debbe Odea, MD  promethazine (PHENERGAN) 25 MG suppository Place 1 suppository (25 mg total) rectally every 6 (six) hours as needed for nausea or vomiting. 12/14/17  Yes Etta Quill, NP  trimethoprim (TRIMPEX) 100 MG tablet Take 100 mg by mouth daily. UTI prophylaxis. 09/09/18  Yes [provider]  blood glucose meter kit and supplies Dispense based on patient and insurance preference. Use up to four times daily as directed. (FOR ICD-10 E10.9, E11.9). 12/07/17   Eugenie Filler, MD  glucagon (GLUCAGON EMERGENCY) 1 MG injection Inject 1 mg into the muscle once as needed (For very low blood sugars. May repeat in 15-20 minutes if needed.). 12/20/16   Hongalgi, Lenis Dickinson, MD  Insulin Pen Needle 31G X 5 MM MISC 30 Units by Does not apply route at bedtime. 12/07/17   Eugenie Filler, MD  pantoprazole (PROTONIX) 40 MG tablet Take 1 tablet (40 mg total) by mouth daily. Patient not taking: Reported on 10/05/2018 12/21/17 10/05/18  Cristal Ford, DO  scopolamine (TRANSDERM-SCOP) 1 MG/3DAYS Place 1 patch (1.5 mg total) onto the skin every 3 (three) days as needed (during episodes of vomiting). Patient not taking: Reported on 10/04/2018 11/21/17   Debbe Odea, MD    Physical Exam: Vitals:   10/06/18 0830 10/06/18 0845 10/06/18 0900 10/06/18 1000  BP: (!) 136/95  (!) 123/100 138/88  Pulse:   (!) 103 (!) 103  Resp: 16 17 14 16   Temp:      TempSrc:      SpO2:   99% 98%  Height:        Constitutional: 28 y.o. female NAD, calm, comfortable Vitals:   10/06/18 0830 10/06/18 0845 10/06/18 0900 10/06/18 1000  BP: (!) 136/95  (!) 123/100 138/88  Pulse:   (!) 103 (!) 103  Resp: 16 17 14 16   Temp:      TempSrc:      SpO2:   99% 98%  Height:      .General: 27 y.o. female resting in bed in NAD Eyes: PERRL, normal sclera ENMT: Nares  patent w/o discharge, orophaynx clear, dentition normal, ears w/o discharge/lesions/ulcers Cardiovascular: RRR, +S1, S2, no m/g/r, equal pulses throughout Respiratory: CTABL, no w/r/r, normal WOB GI: BS+, NDNT, no masses noted, no organomegaly noted MSK: No e/c/c Skin: No rashes, bruises, ulcerations noted; multiple tattoos over body  Neuro: A&O x 3, no focal deficits Psyc: Appropriate interaction and affect, calm/cooperative   Labs on Admission: I have personally reviewed following labs and imaging studies  CBC: Recent Labs  Lab 10/04/18 0033 10/04/18 1351 10/05/18 0027 10/06/18 0054  WBC 20.0* 20.1* 16.8* 15.3*  NEUTROABS  --  18.0*  --  13.2*  HGB 13.8 13.8 11.7* 13.3  HCT 42.7 43.0 36.6 40.9  MCV 88.8 89.2 91.0 89.3  PLT 235 458* 342 337  Basic Metabolic Panel: Recent Labs  Lab 10/04/18 0033 10/04/18 1351 10/05/18 0027 10/06/18 0054  NA 140 141 136 140  K 4.1 3.5 3.4* 3.6  CL 108 108 106 104  CO2 20* 20* 20* 21*  GLUCOSE 279* 110* 149* 93  BUN 18 27* 19 14  CREATININE 0.75 1.02* 0.64 0.63  CALCIUM 9.2 9.9 8.4* 9.2   GFR: Estimated Creatinine Clearance: 86.6 mL/min (by C-G formula based on SCr of 0.63 mg/dL). Liver Function Tests: Recent Labs  Lab 10/04/18 0033 10/04/18 1351 10/06/18 0054  AST 22 19 29   ALT 13 16 19   ALKPHOS 99 121 89  BILITOT 1.2 0.8 0.7  PROT 8.5* 9.8* 7.7  ALBUMIN 4.4 5.4* 4.3   Recent Labs  Lab 10/04/18 0033 10/04/18 1351 10/06/18 0054  LIPASE 19 20 13    No results for input(s): AMMONIA in the last 168 hours. Coagulation Profile: No results for input(s): INR, PROTIME in the last 168 hours. Cardiac Enzymes: No results for input(s): CKTOTAL, CKMB, CKMBINDEX, TROPONINI in the last 168 hours. BNP (last 3 results) No results for input(s): PROBNP in the last 8760 hours. HbA1C: No results for input(s): HGBA1C in the last 72 hours. CBG: Recent Labs  Lab 10/06/18 0052 10/06/18 0413 10/06/18 0442 10/06/18 0509 10/06/18  0631  GLUCAP 78 33* 160* 154* 182*   Lipid Profile: No results for input(s): CHOL, HDL, LDLCALC, TRIG, CHOLHDL, LDLDIRECT in the last 72 hours. Thyroid Function Tests: No results for input(s): TSH, T4TOTAL, FREET4, T3FREE, THYROIDAB in the last 72 hours. Anemia Panel: No results for input(s): VITAMINB12, FOLATE, FERRITIN, TIBC, IRON, RETICCTPCT in the last 72 hours. Urine analysis:    Component Value Date/Time   COLORURINE STRAW (A) 10/06/2018 0711   APPEARANCEUR CLEAR 10/06/2018 0711   LABSPEC 1.014 10/06/2018 0711   PHURINE 6.0 10/06/2018 0711   GLUCOSEU >=500 (A) 10/06/2018 0711   GLUCOSEU >=1000 11/07/2012 1205   HGBUR SMALL (A) 10/06/2018 0711   BILIRUBINUR NEGATIVE 10/06/2018 0711   KETONESUR 80 (A) 10/06/2018 0711   PROTEINUR 30 (A) 10/06/2018 0711   UROBILINOGEN 0.2 11/24/2014 1045   NITRITE NEGATIVE 10/06/2018 0711   LEUKOCYTESUR SMALL (A) 10/06/2018 0711    Radiological Exams on Admission: Ct Abdomen Pelvis W Contrast  Result Date: 10/04/2018 CLINICAL DATA:  28 year old female with acute abdominal and pelvic pain for 2 days. EXAM: CT ABDOMEN AND PELVIS WITH CONTRAST TECHNIQUE: Multidetector CT imaging of the abdomen and pelvis was performed using the standard protocol following bolus administration of intravenous contrast. CONTRAST:  179m OMNIPAQUE IOHEXOL 300 MG/ML  SOLN COMPARISON:  11/26/2017 CT and prior studies FINDINGS: Lower chest: No acute abnormality. Hepatobiliary: Very subtle hypodense lesions within both the RIGHT and LEFT lobes are unchanged from 10/23/2016. No new hepatic abnormalities are present. No biliary dilatation. The patient is status post cholecystectomy. Pancreas: Unremarkable Spleen: Unremarkable Adrenals/Urinary Tract: New severe RIGHT hydroureteronephrosis to the bladder is noted without definite obstructing cause identified. Wedge-shaped areas of decreased RIGHT renal enhancement noted and of uncertain chronicity but may represent  infection/pyelonephritis. The bladder is distended with mild focal areas of bladder wall thickening. The LEFT kidney and adrenal glands are unremarkable. Stomach/Bowel: Stomach is within normal limits. No evidence of bowel wall thickening, distention, or inflammatory changes. Vascular/Lymphatic: No significant vascular findings are present. No enlarged abdominal or pelvic lymph nodes. Reproductive: Uterus and bilateral adnexa are unremarkable. Other: No ascites, focal collection or pneumoperitoneum. Musculoskeletal: No acute or suspicious bony abnormalities are identified. IMPRESSION: 1. Severe RIGHT hydroureteronephrosis to  a distended bladder with mild focal areas of bladder wall thickening, of uncertain chronicity but new since 11/26/2017. Dilated RIGHT ureter extends to the bladder without obstructing cause identified. Urology consultation is recommended. 2. Wedge shaped areas of decreased attenuation within the RIGHT kidney of uncertain chronicity but pyelonephritis is not excluded. No evidence of renal abscess. Electronically Signed   By: Margarette Canada M.D.   On: 10/04/2018 16:26    EKG: Independently reviewed.  Assessment/Plan Principal Problem:   Intractable nausea and vomiting Active Problems:   Uncontrolled type 1 diabetes mellitus ([place])   Hypertension   Gastroparesis due to DM ([facility])  Intractable nausea and vomiting Gastroparesis DMt1     - history of DM type 1 on insulin pump     - hx of gastroparesis, uncontrolled; on chronic reglan 58m and PRN scopolamine     - give IV reglan for now, PRN phenergan     - protonix     - monitor blood sugars     - recent CT ab/pelvis (10/04/2018) shows hydronephrosis and can not exclude pyelonephritis of the right kindey; UA is equivical: let's check follow up renal ultrasound to assess her hydronephritis, let's start on rocephin for possible pyelo -- justified d/t elevated WBCs, dirty UA on 15th and today     - fluids, get mobile     - avoid  narcotics or any bowel slowing agents  HTN     - BP ok now; monitor  Hydronephrosis     - continue foley for full 7 days per urology     - repeat ultrasound   DVT prophylaxis: lovenox  Code Status: FULL  Family Communication: None at bedside  Disposition Plan: TBD  Consults called: None  Admission status: Observation. Concern for acute decompensation without further in-house intervention.     TJonnie FinnerDO [facility] Pager [phone] If 7PM-7AM, please contact night-coverage [email or web address] Password [facility] 10/06/2018, 10:43 AM

## 2018-10-06 NOTE — Progress Notes (Signed)
Gave patient 4oz of orange juice,rechecked CBG in 40mins , was  85mg /dL. New orders received from Dr Marylyn Ishihara. Will continue to monitor.

## 2018-10-06 NOTE — ED Notes (Signed)
ED TO INPATIENT HANDOFF REPORT  Name/Age/Gender [name] 28 y.o. female  Code Status    Code Status Orders  (From admission, onward)         Start     Ordered   10/06/18 1127  Full code  Continuous     10/06/18 1126        Code Status History    Date Active Date Inactive Code Status Order ID Comments User Context   10/04/2018 2002 10/05/2018 1405 Full Code [id number]  Orene Desanctis, DO ED   12/22/2017 1757 12/27/2017 2015 Full Code [id number]  Tawni Millers, MD Inpatient   12/19/2017 1737 12/21/2017 1403 Full Code [id number]  Cristal Deer, MD Inpatient   12/08/2017 1340 12/11/2017 1618 Full Code [id number]  Damita Lack, MD ED   11/29/2017 0635 12/07/2017 1840 Full Code [id number]  Rise Patience, MD Inpatient   11/15/2017 0503 11/21/2017 1747 Full Code [id number]  Rise Patience, MD Inpatient   11/02/2017 1757 11/07/2017 1354 Full Code [id number]  Charlynne Cousins, MD Inpatient   01/21/2017 2304 01/29/2017 1728 Full Code [id number]  Rise Patience, MD ED   12/18/2016 0704 12/20/2016 1727 Full Code [id number]  Rondel Jumbo, PA-C ED   11/05/2016 1525 11/11/2016 1456 Full Code [id number]  Phillips Grout, MD Inpatient   10/23/2016 1342 10/29/2016 2103 Full Code [id number]  Elwin Mocha, MD ED   09/26/2016 1503 09/29/2016 1533 Full Code [id number]  Georgette Shell, MD ED   07/15/2016 1712 07/18/2016 2035 Full Code [id number]  Damita Lack, MD Inpatient   07/05/2016 1658 07/11/2016 1620 Full Code [id number]  Bonnielee Haff, MD Inpatient   06/23/2016 0856 06/28/2016 1346 Full Code [id number]  Janece Canterbury, MD Inpatient   06/12/2016 2137 06/14/2016 1723 Full Code [id number]  Sid Falcon, MD Inpatient   06/03/2016 2044 06/06/2016 2041 Full Code [id number]  Gardiner Barefoot, NP Inpatient   05/31/2016 2348 06/03/2016 2044 Full Code [id number]  Rise Patience, MD Inpatient   05/14/2016 0212 05/16/2016 1718 Full Code [id number]  Toy Baker,  MD Inpatient   05/04/2016 2140 05/10/2016 2004 Full Code [id number]  Orson Eva, MD Inpatient   02/29/2016 2201 03/04/2016 1646 Full Code [id number]  Toy Baker, MD Inpatient   02/20/2016 1441 02/25/2016 1723 Full Code [id number]  Elgergawy, Silver Huguenin, MD Inpatient   01/22/2016 1021 01/25/2016 1607 Full Code [id number]  Orson Eva, MD Inpatient   01/17/2016 0634 01/19/2016 1944 Full Code [id number]  Etta Quill, DO ED   11/18/2015 2238 11/23/2015 1231 Full Code [id number]  Bonnielee Haff, MD Inpatient   11/09/2015 2048 11/12/2015 1535 Full Code [id number]  Toy Baker, MD Inpatient   11/09/2015 2048 11/09/2015 2048 Full Code [id number]  Toy Baker, MD Inpatient   11/03/2015 0238 11/06/2015 1301 Full Code [id number]  Jani Gravel, MD Inpatient   11/02/2015 0128 11/03/2015 0238 Full Code [id number]  Phillips Grout, MD Inpatient   07/10/2015 1645 07/13/2015 1431 Full Code [id number]  Ivor Costa, MD ED   07/01/2015 2203 07/06/2015 1522 Full Code [id number]  Gennaro Africa, MD ED   06/22/2015 0624 06/24/2015 1531 Full Code [id number]  Etta Quill, DO Inpatient   06/10/2015 0156 06/11/2015 1519 Full Code [id number]  Reubin Milan, MD Inpatient   05/28/2015 1609 06/03/2015 1932 Full Code [id number]  Barton Dubois, MD Inpatient   05/24/2015 2359 05/25/2015 1858 Full Code [id number]  Theressa Millard, MD Inpatient   04/21/2015 0931 04/23/2015 1445 Full Code [id number]  Gennaro Africa, MD ED   04/08/2015 2229 04/15/2015 1605 Full Code [id number]  Reubin Milan, MD ED   03/22/2015 0243 03/25/2015 1811 Full Code [id number]  Rise Patience, MD ED   02/11/2015 1530 02/12/2015 1805 Full Code [id number]  Greer Pickerel, MD Inpatient   11/24/2014 1309 11/29/2014 1929 Full Code [id number]  Rama, Venetia Maxon, MD Inpatient   09/16/2014 0530 09/21/2014 2007 Full Code [id number]  Etta Quill, DO ED   06/29/2012 1400 07/01/2012 1517 Full Code [id number]  Velvet Bathe, MD Inpatient   12/08/2011 1543 12/12/2011 1450 Full Code  [id number]  Chyrel Masson, RN Inpatient   Advance Care Planning Activity      Home/SNF/Other Home  Chief Complaint chest pain; emesis  Level of Care/Admitting Diagnosis ED Disposition    ED Disposition Condition [place] [facility]: [facility] [id number]  Level of Care: Med-Surg [16]  Covid Evaluation: Asymptomatic Screening Protocol (No Symptoms)  Diagnosis: Intractable nausea and vomiting [id number]  Admitting Physician: Jonnie Finner [id number]  Attending Physician: Jonnie Finner [id number]  Estimated length of stay: past midnight tomorrow  Certification:: I certify this patient will need inpatient services for at least 2 midnights  PT Class (Do Not Modify): Inpatient [101]  PT Acc Code (Do Not Modify): Private [1]       Medical History Past Medical History:  Diagnosis Date  . Anxiety   . Arthritis    "hands, feet, knees" (12/18/2016)  . Asthma   . Diabetic gastroparesis ([place])    Per gastric emptying study 07/09/16 which showed significant delayed gastric emptying.  . Gallstones   . Gastroparesis   . GERD (gastroesophageal reflux disease)   . Heart murmur   . Hepatic steatosis 11/26/2014   and hepatomegaly  . Hypertension    hx (12/18/2016)  . Intractable cyclical vomiting syndrome    Archie Endo 12/18/2016  . Liver mass 11/26/2014  . Pancreatitis, acute 11/26/2014  . Pneumonia    "as a teen X 1" (12/18/2016)  . Type I diabetes mellitus ([place]) 2007   IDDM.  poorly controlled, multiple admits with DKA    Allergies Allergies  Allergen Reactions  . Peanut-Containing Drug Products Swelling and Other (See Comments)    Reaction:  Swelling of mouth and lips   . 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   Reaction:  Swelling of mouth and lips   . Ultram [Tramadol] Itching    IV Location/Drains/Wounds Patient Lines/Drains/Airways Status   Active Line/Drains/Airways    Name:   Placement date:   Placement  time:   Site:   Days:   Peripheral IV 10/06/18 Left;Anterior Forearm   10/06/18    0117    Forearm   less than 1   Urethral Catheter Rebekah RN Straight-tip 14 Fr.   10/04/18    1945    Straight-tip   2          Labs/Imaging Results for orders placed or performed during the hospital encounter of 10/05/18 (from the past 48 hour(s))  POC CBG, ED     Status: None   Collection Time: 10/06/18 12:52 AM  Result Value Ref Range   Glucose-Capillary 78 70 - 99 mg/dL  CBC with Differential     Status: Abnormal   Collection Time: 10/06/18 12:54 AM  Result Value Ref Range   WBC 15.3 (H) 4.0 - 10.5 K/uL   RBC 4.58 3.87 - 5.11 MIL/uL  Hemoglobin 13.3 12.0 - 15.0 g/dL   HCT 40.9 36.0 - 46.0 %   MCV 89.3 80.0 - 100.0 fL   MCH 29.0 26.0 - 34.0 pg   MCHC 32.5 30.0 - 36.0 g/dL   RDW 13.2 11.5 - 15.5 %   Platelets 337 150 - 400 K/uL   nRBC 0.0 0.0 - 0.2 %   Neutrophils Relative % 87 %   Neutro Abs 13.2 (H) 1.7 - 7.7 K/uL   Lymphocytes Relative 9 %   Lymphs Abs 1.3 0.7 - 4.0 K/uL   Monocytes Relative 4 %   Monocytes Absolute 0.7 0.1 - 1.0 K/uL   Eosinophils Relative 0 %   Eosinophils Absolute 0.0 0.0 - 0.5 K/uL   Basophils Relative 0 %   Basophils Absolute 0.0 0.0 - 0.1 K/uL   Immature Granulocytes 0 %   Abs Immature Granulocytes 0.03 0.00 - 0.07 K/uL    Comment: Performed at [facility], [place] [address] [place], [place] [address]  Comprehensive metabolic panel     Status: Abnormal   Collection Time: 10/06/18 12:54 AM  Result Value Ref Range   Sodium 140 135 - 145 mmol/L   Potassium 3.6 3.5 - 5.1 mmol/L   Chloride 104 98 - 111 mmol/L   CO2 21 (L) 22 - 32 mmol/L   Glucose, Bld 93 70 - 99 mg/dL   BUN 14 6 - 20 mg/dL   Creatinine, Ser 0.63 0.44 - 1.00 mg/dL   Calcium 9.2 8.9 - 10.3 mg/dL   Total Protein 7.7 6.5 - 8.1 g/dL   Albumin 4.3 3.5 - 5.0 g/dL   AST 29 15 - 41 U/L   ALT 19 0 - 44 U/L   Alkaline Phosphatase 89 38 - 126 U/L   Total Bilirubin 0.7 0.3 - 1.2  mg/dL   GFR calc non Af Amer >60 >60 mL/min   GFR calc Af Amer >60 >60 mL/min   Anion gap 15 5 - 15    Comment: Performed at [facility], [place] [address] [place], [place] [address]  Lipase, blood     Status: None   Collection Time: 10/06/18 12:54 AM  Result Value Ref Range   Lipase 13 11 - 51 U/L    Comment: Performed at [facility], [place] [address], [place], [place] [address]  Urinalysis, Routine w reflex microscopic     Status: Abnormal   Collection Time: 10/06/18 12:54 AM  Result Value Ref Range   Color, Urine YELLOW YELLOW   APPearance CLOUDY (A) CLEAR   Specific Gravity, Urine 1.025 1.005 - 1.030   pH 5.0 5.0 - 8.0   Glucose, UA NEGATIVE NEGATIVE mg/dL   Hgb urine dipstick LARGE (A) NEGATIVE   Bilirubin Urine NEGATIVE NEGATIVE   Ketones, ur 80 (A) NEGATIVE mg/dL   Protein, ur >=300 (A) NEGATIVE mg/dL   Nitrite NEGATIVE NEGATIVE   Leukocytes,Ua MODERATE (A) NEGATIVE   RBC / HPF >50 (H) 0 - 5 RBC/hpf   WBC, UA >50 (H) 0 - 5 WBC/hpf   Bacteria, UA RARE (A) NONE SEEN   Squamous Epithelial / LPF 0-5 0 - 5   Mucus PRESENT    Budding Yeast PRESENT     Comment: Performed at [facility], [place] [address], [place], [place] [address]  POC CBG, ED     Status: Abnormal   Collection Time: 10/06/18  4:13 AM  Result Value Ref Range   Glucose-Capillary 33 (LL) 70 -  99 mg/dL   Comment 1 Document in Chart   CBG monitoring, ED (now and then every hour for 3 hours)     Status: Abnormal   Collection Time: 10/06/18  4:42 AM  Result Value Ref Range   Glucose-Capillary 160 (H) 70 - 99 mg/dL  CBG monitoring, ED (now and then every hour for 3 hours)     Status: Abnormal   Collection Time: 10/06/18  5:09 AM  Result Value Ref Range   Glucose-Capillary 154 (H) 70 - 99 mg/dL  Blood gas, arterial (at [facility])     Status: None   Collection Time: 10/06/18  6:20 AM  Result Value Ref Range   FIO2 21.00    pH, Arterial 7.396 7.350 - 7.450    pCO2 arterial 38.0 32.0 - 48.0 mmHg   pO2, Arterial 89.8 83.0 - 108.0 mmHg   Bicarbonate 22.9 20.0 - 28.0 mmol/L   Acid-base deficit 1.2 0.0 - 2.0 mmol/L   O2 Saturation 97.0 %   Patient temperature 98.6    Collection site RIGHT RADIAL    Drawn by [id number]    Sample type ARTERIAL DRAW    Allens test (pass/fail) PASS PASS    Comment: Performed at [facility], [place] [address], [place], [place] [address]  POC CBG, ED     Status: Abnormal   Collection Time: 10/06/18  6:31 AM  Result Value Ref Range   Glucose-Capillary 182 (H) 70 - 99 mg/dL  Urinalysis, Routine w reflex microscopic     Status: Abnormal   Collection Time: 10/06/18  7:11 AM  Result Value Ref Range   Color, Urine STRAW (A) YELLOW   APPearance CLEAR CLEAR   Specific Gravity, Urine 1.014 1.005 - 1.030   pH 6.0 5.0 - 8.0   Glucose, UA >=500 (A) NEGATIVE mg/dL   Hgb urine dipstick SMALL (A) NEGATIVE   Bilirubin Urine NEGATIVE NEGATIVE   Ketones, ur 80 (A) NEGATIVE mg/dL   Protein, ur 30 (A) NEGATIVE mg/dL   Nitrite NEGATIVE NEGATIVE   Leukocytes,Ua SMALL (A) NEGATIVE   RBC / HPF 21-50 0 - 5 RBC/hpf   WBC, UA 21-50 0 - 5 WBC/hpf   Bacteria, UA MANY (A) NONE SEEN   Squamous Epithelial / LPF 0-5 0 - 5   Mucus PRESENT     Comment: Performed at [facility], [place] [address], [place], [place] [address]  Renal function panel     Status: Abnormal   Collection Time: 10/06/18 12:14 PM  Result Value Ref Range   Sodium 139 135 - 145 mmol/L   Potassium 3.8 3.5 - 5.1 mmol/L   Chloride 109 98 - 111 mmol/L   CO2 22 22 - 32 mmol/L   Glucose, Bld 66 (L) 70 - 99 mg/dL   BUN 9 6 - 20 mg/dL   Creatinine, Ser 0.52 0.44 - 1.00 mg/dL   Calcium 8.5 (L) 8.9 - 10.3 mg/dL   Phosphorus 2.1 (L) 2.5 - 4.6 mg/dL   Albumin 3.6 3.5 - 5.0 g/dL   GFR calc non Af Amer >60 >60 mL/min   GFR calc Af Amer >60 >60 mL/min   Anion gap 8 5 - 15    Comment: Performed at [facility], [place] [address] [place], [place] [address]  CBC with Differential/Platelet     Status: Abnormal   Collection Time: 10/06/18 12:14 PM  Result Value Ref Range   WBC 10.0 4.0 - 10.5 K/uL   RBC  4.09 3.87 - 5.11 MIL/uL   Hemoglobin 11.7 (L) 12.0 - 15.0 g/dL   HCT 35.4 (L) 36.0 - 46.0 %   MCV 86.6 80.0 - 100.0 fL   MCH 28.6 26.0 - 34.0 pg   MCHC 33.1 30.0 - 36.0 g/dL   RDW 13.1 11.5 - 15.5 %   Platelets 326 150 - 400 K/uL   nRBC 0.0 0.0 - 0.2 %   Neutrophils Relative % 74 %   Neutro Abs 7.4 1.7 - 7.7 K/uL   Lymphocytes Relative 17 %   Lymphs Abs 1.7 0.7 - 4.0 K/uL   Monocytes Relative 8 %   Monocytes Absolute 0.8 0.1 - 1.0 K/uL   Eosinophils Relative 1 %   Eosinophils Absolute 0.1 0.0 - 0.5 K/uL   Basophils Relative 0 %   Basophils Absolute 0.0 0.0 - 0.1 K/uL   Immature Granulocytes 0 %   Abs Immature Granulocytes 0.04 0.00 - 0.07 K/uL    Comment: Performed at [facility], [place] [address] [place], [place] [address]  Magnesium     Status: None   Collection Time: 10/06/18 12:14 PM  Result Value Ref Range   Magnesium 1.7 1.7 - 2.4 mg/dL    Comment: Performed at [facility], [place] [address], [place], [place] [address]   Ct Abdomen Pelvis W Contrast  Result Date: 10/04/2018 CLINICAL DATA:  28 year old female with acute abdominal and pelvic pain for 2 days. EXAM: CT ABDOMEN AND PELVIS WITH CONTRAST TECHNIQUE: Multidetector CT imaging of the abdomen and pelvis was performed using the standard protocol following bolus administration of intravenous contrast. CONTRAST:  196mL OMNIPAQUE IOHEXOL 300 MG/ML  SOLN COMPARISON:  11/26/2017 CT and prior studies FINDINGS: Lower chest: No acute abnormality. Hepatobiliary: Very subtle hypodense lesions within both the RIGHT and LEFT lobes are unchanged from 10/23/2016. No new hepatic abnormalities are present. No biliary dilatation. The patient is status post cholecystectomy. Pancreas: Unremarkable Spleen: Unremarkable Adrenals/Urinary  Tract: New severe RIGHT hydroureteronephrosis to the bladder is noted without definite obstructing cause identified. Wedge-shaped areas of decreased RIGHT renal enhancement noted and of uncertain chronicity but may represent infection/pyelonephritis. The bladder is distended with mild focal areas of bladder wall thickening. The LEFT kidney and adrenal glands are unremarkable. Stomach/Bowel: Stomach is within normal limits. No evidence of bowel wall thickening, distention, or inflammatory changes. Vascular/Lymphatic: No significant vascular findings are present. No enlarged abdominal or pelvic lymph nodes. Reproductive: Uterus and bilateral adnexa are unremarkable. Other: No ascites, focal collection or pneumoperitoneum. Musculoskeletal: No acute or suspicious bony abnormalities are identified. IMPRESSION: 1. Severe RIGHT hydroureteronephrosis to a distended bladder with mild focal areas of bladder wall thickening, of uncertain chronicity but new since 11/26/2017. Dilated RIGHT ureter extends to the bladder without obstructing cause identified. Urology consultation is recommended. 2. Wedge shaped areas of decreased attenuation within the RIGHT kidney of uncertain chronicity but pyelonephritis is not excluded. No evidence of renal abscess. Electronically Signed   By: Margarette Canada M.D.   On: 10/04/2018 16:26   US Renal  Result Date: 10/06/2018 CLINICAL DATA:  Assess for hydronephrosis EXAM: RENAL / URINARY TRACT ULTRASOUND COMPLETE COMPARISON:  None. FINDINGS: Right Kidney: Renal measurements: 10.3 x 5 x 5.3 cm = volume: 144 mL . Echogenicity within normal limits. No mass or hydronephrosis visualized. Left Kidney: Renal measurements: 10.4 x 4.6 x 4.5 cm = volume: 112 mL. Echogenicity within normal limits. No mass or hydronephrosis visualized. Bladder: Evaluation of the bladder is limited.  The bladder  is empty. IMPRESSION: Normal bilateral kidneys.  No hydronephrosis bilaterally. Electronically Signed   By: Abelardo Diesel M.D.   On: 10/06/2018 12:25    Pending Labs Unresulted Labs (From admission, onward)    Start     Ordered   10/13/18 0500  Creatinine, serum  (enoxaparin (LOVENOX)    CrCl >/= 30 ml/min)  Weekly,   R    Comments: while on enoxaparin therapy    10/06/18 1126   10/07/18 0500  Comprehensive metabolic panel  Tomorrow morning,   R     10/06/18 1126   10/07/18 0500  CBC  Tomorrow morning,   R     10/06/18 1126   10/06/18 0704  Urine culture  ONCE - STAT,   STAT     10/06/18 0704   10/06/18 0148  Urine culture  Add-on,   AD     10/06/18 0147          Vitals/Pain Today's Vitals   10/06/18 1330 10/06/18 1400 10/06/18 1430 10/06/18 1500  BP: (!) 137/97 (!) 135/97 123/82 130/82  Pulse: 95 98 96 (!) 101  Resp: 16 18  16   Temp:      TempSrc:      SpO2: 100% 100% 100% 99%  Height:      PainSc:        Isolation Precautions No active isolations  Medications Medications  scopolamine (TRANSDERM-SCOP) 1 MG/3DAYS 1.5 mg (1.5 mg Transdermal Patch Applied 10/05/18 2324)  influenza vac split quadrivalent PF (FLUARIX) injection 0.5 mL (has no administration in time range)  lisinopril (ZESTRIL) tablet 2.5 mg (has no administration in time range)  albuterol (VENTOLIN HFA) 108 (90 Base) MCG/ACT inhaler 1-2 puff (has no administration in time range)  promethazine (PHENERGAN) suppository 25 mg (has no administration in time range)  enoxaparin (LOVENOX) injection 40 mg (has no administration in time range)  0.9 %  sodium chloride infusion ( Intravenous New Bag/Given 10/06/18 1241)  ketorolac (TORADOL) 15 MG/ML injection 15 mg (15 mg Intravenous Given 10/06/18 1231)  acetaminophen (TYLENOL) tablet 650 mg (has no administration in time range)    Or  acetaminophen (TYLENOL) suppository 650 mg (has no administration in time range)  docusate sodium (COLACE) capsule 100 mg (100 mg Oral Given 10/06/18 1238)  bisacodyl (DULCOLAX) suppository 10 mg (has no administration in time range)   metoCLOPramide (REGLAN) injection 10 mg (10 mg Intravenous Given 10/06/18 1233)  cefTRIAXone (ROCEPHIN) 2 g in sodium chloride 0.9 % 100 mL IVPB (2 g Intravenous New Bag/Given (Non-Interop) 10/06/18 1240)  pantoprazole (PROTONIX) injection 40 mg (40 mg Intravenous Given 10/06/18 1236)  sodium chloride 0.9 % bolus 1,000 mL (0 mLs Intravenous Stopped 10/06/18 0232)  haloperidol lactate (HALDOL) injection 5 mg (5 mg Intramuscular Given 10/05/18 2319)  promethazine (PHENERGAN) injection 25 mg (25 mg Intravenous Given 10/06/18 0126)  sodium chloride 0.9 % bolus 1,000 mL (0 mLs Intravenous Stopped 10/06/18 0345)  ketorolac (TORADOL) 30 MG/ML injection 30 mg (30 mg Intravenous Given 10/06/18 0235)  dicyclomine (BENTYL) injection 20 mg (20 mg Intramuscular Given 10/06/18 0234)  morphine 4 MG/ML injection 4 mg (4 mg Intravenous Given 10/06/18 0429)  ondansetron (ZOFRAN) injection 4 mg (4 mg Intravenous Given 10/06/18 0429)  dextrose 50 % solution 50 mL (50 mLs Intravenous Given 10/06/18 0428)    Mobility walks

## 2018-10-06 NOTE — ED Notes (Signed)
Attempt once to collect lab unsuccessful.

## 2018-10-06 NOTE — ED Notes (Signed)
Attempted to pull D50 from pyxis, but received error message, "One or more items not in formulary, cannot remove." Called pharmacy and requested they send D50 immediately.

## 2018-10-06 NOTE — Progress Notes (Signed)
Received report from [place], [place] in the ED.  Awaiting for patient transport to Room 1614.

## 2018-10-06 NOTE — ED Notes (Signed)
Replaced pt current leg bag with a new leg bag.

## 2018-10-06 NOTE — ED Notes (Signed)
Pt offered a meal but pt refused stated she was not hungry. Pt requesting pain medication stronger at Toradol after Toradol was given.

## 2018-10-06 NOTE — Progress Notes (Signed)
Patient admitted to the unit  from ED at 4:17 pm  ,blood sugar checked, was 33. Dr Marylyn Ishihara notified. Hypoglycemic protocol followed. Patient states she has not eaten since last night

## 2018-10-06 NOTE — ED Notes (Signed)
Transport called to take patient upstairs 

## 2018-10-06 NOTE — ED Notes (Signed)
Pt called out requesting pain mediation. Messaged provider.

## 2018-10-07 ENCOUNTER — Other Ambulatory Visit: Payer: Self-pay

## 2018-10-07 DIAGNOSIS — E1143 Type 2 diabetes mellitus with diabetic autonomic (poly)neuropathy: Secondary | ICD-10-CM

## 2018-10-07 DIAGNOSIS — I10 Essential (primary) hypertension: Secondary | ICD-10-CM

## 2018-10-07 DIAGNOSIS — E10649 Type 1 diabetes mellitus with hypoglycemia without coma: Secondary | ICD-10-CM

## 2018-10-07 DIAGNOSIS — N1330 Unspecified hydronephrosis: Secondary | ICD-10-CM

## 2018-10-07 DIAGNOSIS — N10 Acute tubulo-interstitial nephritis: Secondary | ICD-10-CM | POA: Diagnosis present

## 2018-10-07 DIAGNOSIS — R000 Tachycardia, unspecified: Secondary | ICD-10-CM

## 2018-10-07 DIAGNOSIS — K3184 Gastroparesis: Secondary | ICD-10-CM

## 2018-10-07 LAB — COMPREHENSIVE METABOLIC PANEL
ALT: 17 U/L (ref 0–44)
AST: 26 U/L (ref 15–41)
Albumin: 3.7 g/dL (ref 3.5–5.0)
Alkaline Phosphatase: 79 U/L (ref 38–126)
Anion gap: 9 (ref 5–15)
BUN: 5 mg/dL — ABNORMAL LOW (ref 6–20)
CO2: 23 mmol/L (ref 22–32)
Calcium: 8.4 mg/dL — ABNORMAL LOW (ref 8.9–10.3)
Chloride: 108 mmol/L (ref 98–111)
Creatinine, Ser: 0.45 mg/dL (ref 0.44–1.00)
GFR calc Af Amer: >60 mL/min (ref >60)
GFR calc non Af Amer: >60 mL/min (ref >60)
Glucose, Bld: 42 mg/dL — CL (ref 70–99)
Potassium: 2.9 mmol/L — ABNORMAL LOW (ref 3.5–5.1)
Sodium: 140 mmol/L (ref 135–145)
Total Bilirubin: 0.7 mg/dL (ref 0.3–1.2)
Total Protein: 6.5 g/dL (ref 6.5–8.1)

## 2018-10-07 LAB — URINE CULTURE
Culture: NO GROWTH
Culture: NO GROWTH

## 2018-10-07 LAB — GLUCOSE, CAPILLARY
Glucose-Capillary: 61 mg/dL — ABNORMAL LOW (ref 70–99)
Glucose-Capillary: 137 mg/dL — ABNORMAL HIGH (ref 70–99)
Glucose-Capillary: 135 mg/dL — ABNORMAL HIGH (ref 70–99)
Glucose-Capillary: 248 mg/dL — ABNORMAL HIGH (ref 70–99)
Glucose-Capillary: 176 mg/dL — ABNORMAL HIGH (ref 70–99)
Glucose-Capillary: 85 mg/dL (ref 70–99)

## 2018-10-07 LAB — CBC
HCT: 36.9 % (ref 36.0–46.0)
Hemoglobin: 12.1 g/dL (ref 12.0–15.0)
MCH: 28.9 pg (ref 26.0–34.0)
MCHC: 32.8 g/dL (ref 30.0–36.0)
MCV: 88.3 fL (ref 80.0–100.0)
Platelets: 294 10*3/uL (ref 150–400)
RBC: 4.18 MIL/uL (ref 3.87–5.11)
RDW: 12.9 % (ref 11.5–15.5)
WBC: 8.2 10*3/uL (ref 4.0–10.5)
nRBC: 0 % (ref 0.0–0.2)

## 2018-10-07 LAB — NOVEL CORONAVIRUS, NAA (HOSP ORDER, SEND-OUT TO REF LAB; TAT 18-24 HRS): SARS-CoV-2, NAA: NOT DETECTED

## 2018-10-07 MED ORDER — INSULIN PUMP
Freq: Three times a day (TID) | SUBCUTANEOUS | Status: DC
  Administered 2018-10-07 – 2018-10-12 (×9): via SUBCUTANEOUS
  Administered 2018-10-13: 19:00:00 0.2 via SUBCUTANEOUS
  Administered 2018-10-14: 0.5 via SUBCUTANEOUS
  Administered 2018-10-14 – 2018-10-16 (×7): via SUBCUTANEOUS
  Filled 2018-10-07: qty 1

## 2018-10-07 MED ORDER — ONDANSETRON HCL 4 MG/2ML IJ SOLN
4.0000 mg | Freq: Three times a day (TID) | INTRAMUSCULAR | Status: DC
  Administered 2018-10-07 – 2018-10-12 (×16): 4 mg via INTRAVENOUS
  Filled 2018-10-07 (×16): qty 2

## 2018-10-07 MED ORDER — MORPHINE SULFATE (PF) 2 MG/ML IV SOLN
1.0000 mg | INTRAVENOUS | Status: DC | PRN
  Administered 2018-10-07 – 2018-10-08 (×6): 1 mg via INTRAVENOUS
  Filled 2018-10-07 (×6): qty 1

## 2018-10-07 MED ORDER — METHOCARBAMOL 1000 MG/10ML IJ SOLN
500.0000 mg | Freq: Three times a day (TID) | INTRAVENOUS | Status: AC
  Administered 2018-10-07 (×2): 500 mg via INTRAVENOUS
  Filled 2018-10-07: qty 5
  Filled 2018-10-07 (×2): qty 500
  Filled 2018-10-07: qty 5

## 2018-10-07 MED ORDER — METOCLOPRAMIDE HCL 5 MG/ML IJ SOLN
5.0000 mg | Freq: Three times a day (TID) | INTRAMUSCULAR | Status: DC
  Administered 2018-10-07 – 2018-10-11 (×12): 5 mg via INTRAVENOUS
  Filled 2018-10-07 (×12): qty 2

## 2018-10-07 MED ORDER — DEXTROSE 50 % IV SOLN
INTRAVENOUS | Status: AC
  Filled 2018-10-07: qty 50

## 2018-10-07 MED ORDER — DEXTROSE 50 % IV SOLN
12.5000 g | INTRAVENOUS | Status: AC
  Administered 2018-10-07: 06:00:00 12.5 g via INTRAVENOUS

## 2018-10-07 MED ORDER — SODIUM CHLORIDE 0.9 % IV SOLN
INTRAVENOUS | Status: AC
  Administered 2018-10-07 – 2018-10-09 (×3): via INTRAVENOUS

## 2018-10-07 MED ORDER — SODIUM CHLORIDE 0.9% FLUSH
10.0000 mL | Freq: Two times a day (BID) | INTRAVENOUS | Status: DC
  Administered 2018-10-09 – 2018-10-15 (×7): 10 mL

## 2018-10-07 MED ORDER — PANTOPRAZOLE SODIUM 40 MG IV SOLR: 40.0000 mg | INTRAVENOUS | Status: DC

## 2018-10-07 MED ORDER — SODIUM CHLORIDE 0.9% FLUSH: 10.0000 mL | INTRAVENOUS | Status: DC | PRN

## 2018-10-07 MED ORDER — INSULIN ASPART 100 UNIT/ML ~~LOC~~ SOLN
0.0000 [IU] | SUBCUTANEOUS | Status: DC
  Administered 2018-10-07: 1 [IU] via SUBCUTANEOUS
  Administered 2018-10-07: 3 [IU] via SUBCUTANEOUS

## 2018-10-07 MED ORDER — DEXTROSE-NACL 5-0.45 % IV SOLN
INTRAVENOUS | Status: DC
  Administered 2018-10-07: 09:00:00 via INTRAVENOUS

## 2018-10-07 NOTE — Progress Notes (Signed)
Hypoglycemic Event  CBG: 61  Treatment: D50 12.5 g IV  Symptoms: none  Follow-up CBG: Time: 0642 CBG Result:137  Possible Reasons for Event: emesis  Comments/MD notified: hypoglycemic protocol followed    Carmin [place]

## 2018-10-07 NOTE — Progress Notes (Signed)
Inpatient Diabetes Program Recommendations  AACE/ADA: New Consensus Statement on Inpatient Glycemic Control (2015)  Target Ranges:  Prepandial:   less than 140 mg/dL      Peak postprandial:   less than 180 mg/dL (1-2 hours)      Critically ill patients:  140 - 180 mg/dL   Lab Results  Component Value Date   GLUCAP 248 (H) 10/07/2018   HGBA1C 8.1 (H) 11/02/2017    Review of Glycemic Control  Diabetes history: DM1 Outpatient Diabetes medications: Insulin pump (see settings below) Current orders for Inpatient glycemic control: Novolog 0-9 units Q4H  HgbA1C 8.1%. Very familiar with pt from previous admissions. Pt sees Dr. Hartford Poli for her Type 1 DM.  Pump Settings: Basal rate - 1.4 units/Hour for total of 33.6 units/day Bolus - CHO ratio 1:10    Insulin sensitivity 66  Inpatient Diabetes Program Recommendations:     Pt will need to change pump site on 9/19. Pt states her mother will bring her supplies tonight.   Discussed above with RN.  Follow closely.  Thank you. Lorenda Peck, RD, LDN, CDE Inpatient Diabetes Coordinator [phone]

## 2018-10-07 NOTE — Progress Notes (Signed)
Hypoglycemic Event  CBG: 42  Treatment: orange juice 4 oz  Symptoms: none  Follow-up CBG: Time 0604 CBG Result:61  Possible Reasons for Event: emesis  Comments/MD notified: Hypoglycemic protocol initiated     [name]

## 2018-10-07 NOTE — Progress Notes (Signed)
[facility]  PROGRESS NOTE  [name] [id number] DOB: 06-10-1990 DOA: 10/05/2018 PCP: Vicenta Aly, FNP  Brief History    [name] is a 28 y.o. year old female with medical history significant for type 1 diabetes complicated by frequent gastroparesis flares who presented on 10/05/2018 with nausea/vomiting, crampy abdominal pain since most recent discharge from the hospital (9/15-9-16) on 9/16 and was found to have intractable nausea vomiting presumed secondary to gastroparesis complicated by hypoglycemia episodes and type I diabetic.  A & P     Intractable nausea and vomiting, presumed secondary to gastroparesis.  Symptoms typical of her previous gastroparesis flares.  Doubt SBO/partial obstruction given still passing flatus.  Recent CT abdomen on 9/15 showed no acute abnormalities.  Patient continues to be unable to tolerate oral diet, has persistent vomiting with resultant hypoglycemia requiring scheduled IV Reglan, IV Protonix, and IV dextrose, PRN IV antiemetics, IV morphine judiciously , keep NPO for supportive care patient still requiring inpatient course.  Monitor QTC,    Sinus tachycardia.  Likely due to dehydration in setting of diminished oral intake.  We will continue IV maintenance fluids given inability to tolerate p.o.  Closely monitor.   Type I diabetic with persistent hypoglycemia,A1c 8.1 (10/2017).  Currently n.p.o.  Will suspend insulin pump for no more than 30 minutes while n.p.o., close monitoring of CBG.  Has intermittently required dextrose for hypoglycemia in the setting of poor oral intake.  Repeat A1c   Right-sided hydronephrosis with over distended bladder.  Urology evaluated imaging on 9/16 (previous admission) recommended continuing Foley catheter, outpatient urodynamic evaluation.  Closely monitor output.  Currently on ceftriaxone given some concern for potential pyelonephritis patient is afebrile, no white count currently ( elevated on  admission), UA wnl ( abnormal on the 15th).   Hypertension stable.  At goal.  Continue home lisinopril.     DVT prophylaxis: Lovenox Code Status: Full code Family Communication: No family at bedside Disposition Plan: Needs continued inpatient stay given inability to tolerate oral diet, requiring IV pain control, IV antiemetics, IV dextrose for persistent hypoglycemia, close monitoring of CBGs    [facility] Direct contact: see [email or web address] (further directions at bottom of note if needed) 7PM-7AM contact night coverage as at bottom of note 10/07/2018, 2:23 PM  LOS: 1 day   Consultants  . none  Procedures  . None  Antibiotics  . None  Interval History/Subjective  Unable to tolerate any diet Still has lots of belly pain Still vomiting  Objective   Vitals:  Vitals:   10/07/18 0609 10/07/18 1317  BP: (!) 131/97 136/86  Pulse: (!) 102 (!) 129  Resp: 16 18  Temp: 99 F (37.2 C) 98.6 F (37 C)  SpO2: 99% 98%    Exam:  Awake Alert, Oriented X 3, No new F.N deficits, tearful affect, in obvious discomfort no acute distress Actively vomiting during exam [place].AT Dry oral mucosa Symmetrical Chest wall movement, Good air movement bilaterally, CTAB Tachycardic, normal rhythm, no edema,No Gallops,Rubs or new Murmurs Diminished bowel sounds, tenderness upon palpation, no rigidity, no guarding, no rebound  No new Rash or bruise     I have personally reviewed the following:   Data Reviewed: Basic Metabolic Panel: Recent Labs  Lab 10/04/18 1351 10/05/18 0027 10/06/18 0054 10/06/18 1214 10/07/18 0505  NA 141 136 140 139 140  K 3.5 3.4* 3.6 3.8 2.9*  CL 108 106 104 109 108  CO2 20* 20* 21* 22 23  GLUCOSE 110* 149* 93 66* 42*  BUN 27* 19 14 9  5*  CREATININE 1.02* 0.64 0.63 0.52 0.45  CALCIUM 9.9 8.4* 9.2 8.5* 8.4*  MG  --   --   --  1.7  --   PHOS  --   --   --  2.1*  --    Liver Function Tests: Recent Labs  Lab 10/04/18 0033 10/04/18 1351 10/06/18 0054  10/06/18 1214 10/07/18 0505  AST 22 19 29   --  26  ALT 13 16 19   --  17  ALKPHOS 99 121 89  --  79  BILITOT 1.2 0.8 0.7  --  0.7  PROT 8.5* 9.8* 7.7  --  6.5  ALBUMIN 4.4 5.4* 4.3 3.6 3.7   Recent Labs  Lab 10/04/18 0033 10/04/18 1351 10/06/18 0054  LIPASE 19 20 13    No results for input(s): AMMONIA in the last 168 hours. CBC: Recent Labs  Lab 10/04/18 1351 10/05/18 0027 10/06/18 0054 10/06/18 1214 10/07/18 0505  WBC 20.1* 16.8* 15.3* 10.0 8.2  NEUTROABS 18.0*  --  13.2* 7.4  --   HGB 13.8 11.7* 13.3 11.7* 12.1  HCT 43.0 36.6 40.9 35.4* 36.9  MCV 89.2 91.0 89.3 86.6 88.3  PLT 458* 342 337 326 294   Cardiac Enzymes: No results for input(s): CKTOTAL, CKMB, CKMBINDEX, TROPONINI in the last 168 hours. BNP (last 3 results) No results for input(s): BNP in the last 8760 hours.  ProBNP (last 3 results) No results for input(s): PROBNP in the last 8760 hours.  CBG: Recent Labs  Lab 10/06/18 2149 10/07/18 0604 10/07/18 0642 10/07/18 0740 10/07/18 1158  GLUCAP 162* 61* 137* 135* 248*    Recent Results (from the past 240 hour(s))  Novel Coronavirus, NAA (Hosp order, Send-out to Ref Lab; TAT 18-24 hrs     Status: None   Collection Time: 10/04/18  6:33 PM   Specimen: Nasopharyngeal Swab; Respiratory  Result Value Ref Range Status   SARS-CoV-2, NAA NOT DETECTED NOT DETECTED Final    Comment: (NOTE) This nucleic acid amplification test was developed and its performance characteristics determined by [employer]. Nucleic acid amplification tests include PCR and TMA. This test has not been FDA cleared or approved. This test has been authorized by FDA under an Emergency Use Authorization (EUA). This test is only authorized for the duration of time the declaration that circumstances exist justifying the authorization of the emergency use of in vitro diagnostic tests for detection of SARS-CoV-2 virus and/or diagnosis of COVID-19 infection under section 564(b)(1)  of the Act, 21 U.S.C. [id number]) (1), unless the authorization is terminated or revoked sooner. When diagnostic testing is negative, the possibility of a false negative result should be considered in the context of a patient's recent exposures and the presence of clinical signs and symptoms consistent with COVID-19. An individual without symptoms of COVID- 19 and who is not shedding SARS-CoV-2 vi rus would expect to have a negative (not detected) result in this assay. Performed At: [facility], [place] [id number] Rush Farmer MD [id number]    [place]  Final    Comment: Performed at [place] [address], [place], [place] [address]  Urine culture     Status: None   Collection Time: 10/06/18 12:54 AM   Specimen: Urine, Random  Result Value Ref Range Status   Specimen Description   Final    URINE, RANDOM Performed at [place] [address] [place], [place] [address]  Special Requests   Final    NONE Performed at [facility], [place] [address], [place], [place] [address]    Culture   Final    NO GROWTH Performed at [facility], [place] [address] [place], [place] [address]    Report Status 10/07/2018 FINAL  Final  Urine culture     Status: None   Collection Time: 10/06/18  7:11 AM   Specimen: Urine, Clean Catch  Result Value Ref Range Status   Specimen Description   Final    URINE, CLEAN CATCH Performed at [facility], [place] [address], [place], [place] [address]    Special Requests   Final    NONE Performed at [facility], [place] [address], [place], [place] [address]    Culture   Final    NO GROWTH Performed at [facility], [place] [address] [place],  [address]    Report Status 10/07/2018 FINAL  Final     Studies: US Renal  Result Date: 10/06/2018 CLINICAL DATA:  Assess for  hydronephrosis EXAM: RENAL / URINARY TRACT ULTRASOUND COMPLETE COMPARISON:  None. FINDINGS: Right Kidney: Renal measurements: 10.3 x 5 x 5.3 cm = volume: 144 mL . Echogenicity within normal limits. No mass or hydronephrosis visualized. Left Kidney: Renal measurements: 10.4 x 4.6 x 4.5 cm = volume: 112 mL. Echogenicity within normal limits. No mass or hydronephrosis visualized. Bladder: Evaluation of the bladder is limited.  The bladder is empty. IMPRESSION: Normal bilateral kidneys.  No hydronephrosis bilaterally. Electronically Signed   By: Abelardo Diesel M.D.   On: 10/06/2018 12:25    Scheduled Meds: . dextrose      . docusate sodium  100 mg Oral BID  . enoxaparin (LOVENOX) injection  40 mg Subcutaneous Q24H  . influenza vac split quadrivalent PF  0.5 mL Intramuscular Tomorrow-1000  . lisinopril  2.5 mg Oral Daily  . metoCLOPramide (REGLAN) injection  5 mg Intravenous Q8H  . ondansetron (ZOFRAN) IV  4 mg Intravenous Q8H  . pantoprazole (PROTONIX) IV  40 mg Intravenous Q24H  . scopolamine  1 patch Transdermal Q72H   Continuous Infusions: . cefTRIAXone (ROCEPHIN)  IV Stopped (10/06/18 1559)  . methocarbamol (ROBAXIN) IV      Principal Problem:   Intractable nausea and vomiting Active Problems:   Uncontrolled type 1 diabetes mellitus ([place])   Hypertension   Gastroparesis due to DM ([place])      Desiree Hane  [facility]

## 2018-10-08 LAB — GLUCOSE, CAPILLARY
Glucose-Capillary: 114 mg/dL — ABNORMAL HIGH (ref 70–99)
Glucose-Capillary: 117 mg/dL — ABNORMAL HIGH (ref 70–99)
Glucose-Capillary: 163 mg/dL — ABNORMAL HIGH (ref 70–99)
Glucose-Capillary: 167 mg/dL — ABNORMAL HIGH (ref 70–99)
Glucose-Capillary: 187 mg/dL — ABNORMAL HIGH (ref 70–99)
Glucose-Capillary: 198 mg/dL — ABNORMAL HIGH (ref 70–99)
Glucose-Capillary: 24 mg/dL — CL (ref 70–99)
Glucose-Capillary: 260 mg/dL — ABNORMAL HIGH (ref 70–99)
Glucose-Capillary: 40 mg/dL — CL (ref 70–99)

## 2018-10-08 LAB — CBC
HCT: 40.7 % (ref 36.0–46.0)
Hemoglobin: 13 g/dL (ref 12.0–15.0)
MCH: 28.4 pg (ref 26.0–34.0)
MCHC: 31.9 g/dL (ref 30.0–36.0)
MCV: 89.1 fL (ref 80.0–100.0)
Platelets: 320 10*3/uL (ref 150–400)
RBC: 4.57 MIL/uL (ref 3.87–5.11)
RDW: 12.9 % (ref 11.5–15.5)
WBC: 8.8 10*3/uL (ref 4.0–10.5)
nRBC: 0 % (ref 0.0–0.2)

## 2018-10-08 LAB — BASIC METABOLIC PANEL
Anion gap: 14 (ref 5–15)
BUN: 6 mg/dL (ref 6–20)
CO2: 21 mmol/L — ABNORMAL LOW (ref 22–32)
Calcium: 8.6 mg/dL — ABNORMAL LOW (ref 8.9–10.3)
Chloride: 102 mmol/L (ref 98–111)
Creatinine, Ser: 0.62 mg/dL (ref 0.44–1.00)
GFR calc Af Amer: >60 mL/min (ref >60)
GFR calc non Af Amer: >60 mL/min (ref >60)
Glucose, Bld: 243 mg/dL — ABNORMAL HIGH (ref 70–99)
Potassium: 3.4 mmol/L — ABNORMAL LOW (ref 3.5–5.1)
Sodium: 137 mmol/L (ref 135–145)

## 2018-10-08 LAB — MAGNESIUM: Magnesium: 1.9 mg/dL (ref 1.7–2.4)

## 2018-10-08 LAB — HEMOGLOBIN A1C
Hgb A1c MFr Bld: 8.7 % — ABNORMAL HIGH (ref 4.8–5.6)
Mean Plasma Glucose: 203 mg/dL

## 2018-10-08 MED ORDER — POTASSIUM CHLORIDE 10 MEQ/100ML IV SOLN
10.0000 meq | INTRAVENOUS | Status: AC
  Administered 2018-10-08 (×4): 10 meq via INTRAVENOUS
  Filled 2018-10-08 (×4): qty 100

## 2018-10-08 MED ORDER — DEXTROSE 50 % IV SOLN
INTRAVENOUS | Status: AC
  Filled 2018-10-08: qty 50

## 2018-10-08 MED ORDER — POTASSIUM CHLORIDE 10 MEQ/100ML IV SOLN
10.0000 meq | INTRAVENOUS | Status: AC
  Administered 2018-10-08 – 2018-10-09 (×4): 10 meq via INTRAVENOUS
  Filled 2018-10-08 (×4): qty 100

## 2018-10-08 MED ORDER — MORPHINE SULFATE (PF) 2 MG/ML IV SOLN
1.0000 mg | INTRAVENOUS | Status: DC | PRN
  Administered 2018-10-08 – 2018-10-09 (×6): 1 mg via INTRAVENOUS
  Filled 2018-10-08 (×6): qty 1

## 2018-10-08 MED ORDER — PROMETHAZINE HCL 25 MG/ML IJ SOLN
12.5000 mg | Freq: Four times a day (QID) | INTRAMUSCULAR | Status: DC | PRN
  Administered 2018-10-08 – 2018-10-10 (×7): 12.5 mg via INTRAVENOUS
  Filled 2018-10-08 (×7): qty 1

## 2018-10-08 MED ORDER — DEXTROSE 50 % IV SOLN
25.0000 g | INTRAVENOUS | Status: AC
  Administered 2018-10-08: 25 g via INTRAVENOUS

## 2018-10-08 NOTE — Progress Notes (Signed)
Hypoglycemic Event  CBG: 24  Treatment: D50 25 g IV  Symptoms: none  Follow-up CBG: Time: 0048 CBG Result:198  Possible Reasons for Event: emesis  Comments/MD notified:hypoglycemic protocol initiated, [place], NP aware via page    Carmin [place]

## 2018-10-08 NOTE — Progress Notes (Signed)
[facility]  PROGRESS NOTE  [name] [id number] DOB: 10-16-1990 DOA: 10/05/2018 PCP: Vicenta Aly, FNP  Brief History    [name] is a 28 y.o. year old female with medical history significant for type 1 diabetes complicated by frequent gastroparesis flares who presented on 10/05/2018 with nausea/vomiting, crampy abdominal pain since most recent discharge from the hospital (9/15-9-16) on 9/16 and was found to have intractable nausea vomiting presumed secondary to gastroparesis complicated by hypoglycemia episodes and type I diabetic.  A & P     Intractable nausea and vomiting, presumed secondary to gastroparesis.  Symptoms typical of her previous gastroparesis flares.  Doubt SBO/partial obstruction given still passing flatus.  Recent CT abdomen on 9/15 showed no acute abnormalities.  Patient is tolerating very minimal clear liquid input, not enough to maintain adequate glucose levels as she again had hypoglycemia this afternoon, belly pain somewhat better encouraged to try to increase intake, still has persistent vomiting with resultant hypo kalemia requiring scheduled IV Reglan, IV Protonix, and IV dextrose intermittently, PRN IV antiemetics, IV morphine judiciously , patient still requiring inpatient course.  Monitor QTC,    Sinus tachycardia.  Likely due to dehydration in setting of diminished oral intake.  We will continue IV maintenance fluids given inability to tolerate p.o.  Closely monitor.   Type I diabetic with persistent hypoglycemia,A1c 8.7 (09/2018).  Now on liquid diet Will suspend insulin pump for no more than 30 minutes while n.p.o., close monitoring of CBG.  Has intermittently required dextrose for hypoglycemia in the setting of poor oral intake.    Right-sided hydronephrosis with over distended bladder.  Urology evaluated imaging on 9/16 (previous admission) recommended continuing Foley catheter, outpatient urodynamic evaluation.  Closely monitor  output.  Currently on ceftriaxone given some concern for potential pyelonephritis patient is afebrile, no white count currently ( elevated on admission), UA wnl ( abnormal on the 15th).   Hypertension stable.  At goal.  Continue home lisinopril.     DVT prophylaxis: Lovenox Code Status: Full code Family Communication: No family at bedside Disposition Plan: Needs continued inpatient stay given inability to tolerate oral diet, requiring IV pain control, IV antiemetics, IV dextrose for persistent hypoglycemia, close monitoring of CBGs    [facility] Direct contact: see [email or web address] (further directions at bottom of note if needed) 7PM-7AM contact night coverage as at bottom of note 10/08/2018, 6:15 PM  LOS: 2 days   Consultants  . none  Procedures  . None  Antibiotics  . None  Interval History/Subjective  Tried a little bit of ginger ale this morning and did okay No low blood glucose this morning but had a recurrent event this afternoon of low blood sugar. Feels belly pain is somewhat improved.  Objective   Vitals:  Vitals:   10/08/18 1405 10/08/18 1549  BP: (!) 138/93 (!) 136/109  Pulse: (!) 107 (!) 106  Resp:  14  Temp: 99.5 F (37.5 C) 99.5 F (37.5 C)  SpO2: 100% 99%    Exam:  Awake Alert, Oriented X 3, No new F.N deficits, tearful affect, in obvious discomfort no acute distress [place].AT Dry oral mucosa Symmetrical Chest wall movement, Good air movement bilaterally, CTAB Tachycardic, normal rhythm, no edema,No Gallops,Rubs or new Murmurs Diminished bowel sounds, tenderness upon palpation, no rigidity, no guarding, no rebound  No new Rash or bruise     I have personally reviewed the following:   Data Reviewed: Basic Metabolic Panel: Recent Labs  Lab 10/05/18 0027 10/06/18 0054 10/06/18 1214  10/07/18 0505 10/08/18 0601  NA 136 140 139 140 137  K 3.4* 3.6 3.8 2.9* 3.4*  CL 106 104 109 108 102  CO2 20* 21* 22 23 21*  GLUCOSE 149* 93 66* 42* 243*   BUN 19 14 9  5* 6  CREATININE 0.64 0.63 0.52 0.45 0.62  CALCIUM 8.4* 9.2 8.5* 8.4* 8.6*  MG  --   --  1.7  --  1.9  PHOS  --   --  2.1*  --   --    Liver Function Tests: Recent Labs  Lab 10/04/18 0033 10/04/18 1351 10/06/18 0054 10/06/18 1214 10/07/18 0505  AST 22 19 29   --  26  ALT 13 16 19   --  17  ALKPHOS 99 121 89  --  79  BILITOT 1.2 0.8 0.7  --  0.7  PROT 8.5* 9.8* 7.7  --  6.5  ALBUMIN 4.4 5.4* 4.3 3.6 3.7   Recent Labs  Lab 10/04/18 0033 10/04/18 1351 10/06/18 0054  LIPASE 19 20 13    No results for input(s): AMMONIA in the last 168 hours. CBC: Recent Labs  Lab 10/04/18 1351 10/05/18 0027 10/06/18 0054 10/06/18 1214 10/07/18 0505 10/08/18 0601  WBC 20.1* 16.8* 15.3* 10.0 8.2 8.8  NEUTROABS 18.0*  --  13.2* 7.4  --   --   HGB 13.8 11.7* 13.3 11.7* 12.1 13.0  HCT 43.0 36.6 40.9 35.4* 36.9 40.7  MCV 89.2 91.0 89.3 86.6 88.3 89.1  PLT 458* 342 337 326 294 320   Cardiac Enzymes: No results for input(s): CKTOTAL, CKMB, CKMBINDEX, TROPONINI in the last 168 hours. BNP (last 3 results) No results for input(s): BNP in the last 8760 hours.  ProBNP (last 3 results) No results for input(s): PROBNP in the last 8760 hours.  CBG: Recent Labs  Lab 10/08/18 0505 10/08/18 0735 10/08/18 1139 10/08/18 1547 10/08/18 1620  GLUCAP 163* 260* 117* 40* 187*    Recent Results (from the past 240 hour(s))  Novel Coronavirus, NAA (Hosp order, Send-out to Ref Lab; TAT 18-24 hrs     Status: None   Collection Time: 10/04/18  6:33 PM   Specimen: Nasopharyngeal Swab; Respiratory  Result Value Ref Range Status   SARS-CoV-2, NAA NOT DETECTED NOT DETECTED Final    Comment: (NOTE) This nucleic acid amplification test was developed and its performance characteristics determined by [employer]. Nucleic acid amplification tests include PCR and TMA. This test has not been FDA cleared or approved. This test has been authorized by FDA under an Emergency Use Authorization  (EUA). This test is only authorized for the duration of time the declaration that circumstances exist justifying the authorization of the emergency use of in vitro diagnostic tests for detection of SARS-CoV-2 virus and/or diagnosis of COVID-19 infection under section 564(b)(1) of the Act, 21 U.S.C. [id number]) (1), unless the authorization is terminated or revoked sooner. When diagnostic testing is negative, the possibility of a false negative result should be considered in the context of a patient's recent exposures and the presence of clinical signs and symptoms consistent with COVID-19. An individual without symptoms of COVID- 19 and who is not shedding SARS-CoV-2 vi rus would expect to have a negative (not detected) result in this assay. Performed At: [facility] [place], [place] [id number] Rush Farmer MD [id number]    [place]  Final    Comment: Performed at [place] [address], [place], [place] [address]  Urine culture  Status: None   Collection Time: 10/06/18 12:54 AM   Specimen: Urine, Random  Result Value Ref Range Status   Specimen Description   Final    URINE, RANDOM Performed at [place] [address], [place], [place] [address]    Special Requests   Final    NONE Performed at [facility], [place] [address], [place], [place] [address]    Culture   Final    NO GROWTH Performed at [facility], [place] [address], [place], [place] [address]    Report Status 10/07/2018 FINAL  Final  Urine culture     Status: None   Collection Time: 10/06/18  7:11 AM   Specimen: Urine, Clean Catch  Result Value Ref Range Status   Specimen Description   Final    URINE, CLEAN CATCH Performed at [facility], [place] [address], [place], [place] [address]    Special Requests   Final    NONE Performed at [facility], [place] [address], [place], [place] [address]    Culture   Final    NO GROWTH Performed at [facility], [place] [address] [place],  [address]    Report Status 10/07/2018 FINAL  Final     Studies: No results found.  Scheduled Meds: . docusate sodium  100 mg Oral BID  . enoxaparin (LOVENOX) injection  40 mg Subcutaneous Q24H  . insulin pump   Subcutaneous TID AC, HS, 0200  . lisinopril  2.5 mg Oral Daily  . metoCLOPramide (REGLAN) injection  5 mg Intravenous Q8H  . ondansetron (ZOFRAN) IV  4 mg Intravenous Q8H  . pantoprazole (PROTONIX) IV  40 mg Intravenous Q24H  . scopolamine  1 patch Transdermal Q72H  . sodium chloride flush  10-40 mL Intracatheter Q12H   Continuous Infusions: . sodium chloride 100 mL/hr at 10/08/18 1456  . cefTRIAXone (ROCEPHIN)  IV 2 g (10/08/18 1205)    Principal Problem:   Intractable nausea and vomiting Active Problems:   Uncontrolled type 1 diabetes mellitus ([place])   Sinus tachycardia   Hypertension   Gastroparesis due to DM ([place])   Hydronephrosis of right kidney   Acute pyelonephritis      Desiree Hane  [facility]

## 2018-10-08 NOTE — Progress Notes (Signed)
Hypoglycemic Event  CBG: 40  Treatment: D50 50 mL (25 gm)  Symptoms: None  Follow-up CBG: Time:1620 CBG Result:187  Possible Reasons for Event: Inadequate meal intake  Comments/MD notified: [place], [place]

## 2018-10-09 LAB — GLUCOSE, CAPILLARY
Glucose-Capillary: 150 mg/dL — ABNORMAL HIGH (ref 70–99)
Glucose-Capillary: 265 mg/dL — ABNORMAL HIGH (ref 70–99)
Glucose-Capillary: 66 mg/dL — ABNORMAL LOW (ref 70–99)
Glucose-Capillary: 82 mg/dL (ref 70–99)
Glucose-Capillary: 92 mg/dL (ref 70–99)
Glucose-Capillary: 96 mg/dL (ref 70–99)

## 2018-10-09 LAB — BASIC METABOLIC PANEL
Anion gap: 14 (ref 5–15)
BUN: 9 mg/dL (ref 6–20)
CO2: 15 mmol/L — ABNORMAL LOW (ref 22–32)
Calcium: 8.5 mg/dL — ABNORMAL LOW (ref 8.9–10.3)
Chloride: 107 mmol/L (ref 98–111)
Creatinine, Ser: 0.72 mg/dL (ref 0.44–1.00)
GFR calc Af Amer: >60 mL/min (ref >60)
GFR calc non Af Amer: >60 mL/min (ref >60)
Glucose, Bld: 264 mg/dL — ABNORMAL HIGH (ref 70–99)
Potassium: 4 mmol/L (ref 3.5–5.1)
Sodium: 136 mmol/L (ref 135–145)

## 2018-10-09 MED ORDER — DICYCLOMINE HCL 10 MG PO CAPS
10.0000 mg | ORAL_CAPSULE | Freq: Three times a day (TID) | ORAL | Status: DC
  Administered 2018-10-09 – 2018-10-12 (×8): 10 mg via ORAL
  Filled 2018-10-09 (×8): qty 1

## 2018-10-09 MED ORDER — SODIUM CHLORIDE 0.9 % IV SOLN
250.0000 mg | Freq: Three times a day (TID) | INTRAVENOUS | Status: DC
  Administered 2018-10-09 – 2018-10-11 (×5): 250 mg via INTRAVENOUS
  Filled 2018-10-09 (×9): qty 5

## 2018-10-09 MED ORDER — MORPHINE SULFATE (PF) 2 MG/ML IV SOLN
1.0000 mg | INTRAVENOUS | Status: DC | PRN
  Administered 2018-10-09 – 2018-10-14 (×21): 1 mg via INTRAVENOUS
  Filled 2018-10-09 (×22): qty 1

## 2018-10-09 NOTE — Progress Notes (Signed)
[facility]  PROGRESS NOTE  Alisya Evers [id number] DOB: 1990-12-27 DOA: 10/05/2018 PCP: Vicenta Aly, FNP  Brief History    Casidee Manteufel is a 28 y.o. year old female with medical history significant for type 1 diabetes complicated by frequent gastroparesis flares ( with significantly delayed gastric emptying study on, 06/2016) who presented on 10/05/2018 with nausea/vomiting, crampy abdominal pain since most recent discharge from the hospital (9/15-9-16) on 9/16 and was found to have intractable nausea vomiting presumed secondary to gastroparesis complicated by hypoglycemia episodes in a type I diabetic.  A & P     Intractable nausea and vomiting, presumed secondary to diabetic gastroparesis.  Symptoms typical of her previous gastroparesis flares.  Doubt SBO/partial obstruction given still passing flatus.  Recent CT abdomen on 9/15 showed no acute abnormalities.  Patient has persistent nausea/vomiting and unable to tolerate clear liquids at all, will add scheduled erythromycin 250 3 times daily which is helped in the past, continue scheduled Reglan, closely monitor QTC with daily EKGs.  Encouraged infrequent use of opioids, and PRN Bentyl, if no significant improvement in 24 hours we will consult GI for further assistance, continue IV Protonix as well    Sinus tachycardia, persists.  Likely due to dehydration in setting of diminished oral intake/persistent nausea vomiting.  We will continue IV maintenance fluids given inability to tolerate p.o.  Closely monitor.   Type I diabetic with persistent hypoglycemia,A1c 8.7 (09/2018).  Still not tolerating liquid diet.   suspend insulin pump for no more than 30 minutes while diminished p.o. intake, close monitoring of CBG.  Has intermittently required dextrose for hypoglycemia in the setting of poor oral intake/vomiting.    Right-sided hydronephrosis with over distended bladder.  Urology evaluated imaging on 9/16 (previous  admission) recommended continuing Foley catheter, outpatient urodynamic evaluation.  Closely monitor output.  Currently on day 5 of ceftriaxone given some concern for potential pyelonephritis patient is afebrile, no white count currently ( elevated on admission), UA wnl ( abnormal on the 15th).   Hypertension stable.  At goal.  Continue home lisinopril.     DVT prophylaxis: Lovenox Code Status: Full code Family Communication: No family at bedside Disposition Plan: Needs continued inpatient stay given inability to tolerate oral diet, intractable nausea/vomiting requiring IV pain control, scheduled IV antiemetics, IV dextrose for persistent hypoglycemia, close monitoring of CBGs    [facility] Direct contact: see [email or web address] (further directions at bottom of note if needed) 7PM-7AM contact night coverage as at bottom of note 10/09/2018, 12:36 PM  LOS: 3 days   Consultants  . none  Procedures  . None  Antibiotics  . Ceftriaxone  Interval History/Subjective  Not able to keep any liquids down Still vomiting though she thinks is less often Not requiring as much IV pain medicine Actively vomiting throughout exam  Objective   Vitals:  Vitals:   10/09/18 1015 10/09/18 1227  BP: (!) 147/103 (!) 152/103  Pulse: (!) 124 (!) 118  Resp: 17 20  Temp: 99 F (37.2 C) 99.2 F (37.3 C)  SpO2: 100% 100%    Exam:  Awake Alert, Oriented X 3, No new F.N deficits, actively vomiting during exam  dry oral mucosa Symmetrical Chest wall movement Tachycardic Diminished bowel sounds, no rigidity, no guarding, no rebound     I have personally reviewed the following:   Data Reviewed: Basic Metabolic Panel: Recent Labs  Lab 10/06/18 0054 10/06/18 1214 10/07/18 0505 10/08/18 0601 10/09/18 0421  NA 140 139 140 137 136  K  3.6 3.8 2.9* 3.4* 4.0  CL 104 109 108 102 107  CO2 21* 22 23 21* 15*  GLUCOSE 93 66* 42* 243* 264*  BUN 14 9 5* 6 9  CREATININE 0.63 0.52 0.45 0.62 0.72   CALCIUM 9.2 8.5* 8.4* 8.6* 8.5*  MG  --  1.7  --  1.9  --   PHOS  --  2.1*  --   --   --    Liver Function Tests: Recent Labs  Lab 10/04/18 0033 10/04/18 1351 10/06/18 0054 10/06/18 1214 10/07/18 0505  AST 22 19 29   --  26  ALT 13 16 19   --  17  ALKPHOS 99 121 89  --  79  BILITOT 1.2 0.8 0.7  --  0.7  PROT 8.5* 9.8* 7.7  --  6.5  ALBUMIN 4.4 5.4* 4.3 3.6 3.7   Recent Labs  Lab 10/04/18 0033 10/04/18 1351 10/06/18 0054  LIPASE 19 20 13    No results for input(s): AMMONIA in the last 168 hours. CBC: Recent Labs  Lab 10/04/18 1351 10/05/18 0027 10/06/18 0054 10/06/18 1214 10/07/18 0505 10/08/18 0601  WBC 20.1* 16.8* 15.3* 10.0 8.2 8.8  NEUTROABS 18.0*  --  13.2* 7.4  --   --   HGB 13.8 11.7* 13.3 11.7* 12.1 13.0  HCT 43.0 36.6 40.9 35.4* 36.9 40.7  MCV 89.2 91.0 89.3 86.6 88.3 89.1  PLT 458* 342 337 326 294 320   Cardiac Enzymes: No results for input(s): CKTOTAL, CKMB, CKMBINDEX, TROPONINI in the last 168 hours. BNP (last 3 results) No results for input(s): BNP in the last 8760 hours.  ProBNP (last 3 results) No results for input(s): PROBNP in the last 8760 hours.  CBG: Recent Labs  Lab 10/08/18 2016 10/09/18 0002 10/09/18 0410 10/09/18 0729 10/09/18 1203  GLUCAP 167* 92 265* 150* 82    Recent Results (from the past 240 hour(s))  Novel Coronavirus, NAA (Hosp order, Send-out to Ref Lab; TAT 18-24 hrs     Status: None   Collection Time: 10/04/18  6:33 PM   Specimen: Nasopharyngeal Swab; Respiratory  Result Value Ref Range Status   SARS-CoV-2, NAA NOT DETECTED NOT DETECTED Final    Comment: (NOTE) This nucleic acid amplification test was developed and its performance characteristics determined by [employer]. Nucleic acid amplification tests include PCR and TMA. This test has not been FDA cleared or approved. This test has been authorized by FDA under an Emergency Use Authorization (EUA). This test is only authorized for the duration of  time the declaration that circumstances exist justifying the authorization of the emergency use of in vitro diagnostic tests for detection of SARS-CoV-2 virus and/or diagnosis of COVID-19 infection under section 564(b)(1) of the Act, 21 U.S.C. [id number]) (1), unless the authorization is terminated or revoked sooner. When diagnostic testing is negative, the possibility of a false negative result should be considered in the context of a patient's recent exposures and the presence of clinical signs and symptoms consistent with COVID-19. An individual without symptoms of COVID- 19 and who is not shedding SARS-CoV-2 vi rus would expect to have a negative (not detected) result in this assay. Performed At: [facility] [place], [place] [id number] Rush Farmer MD [id number]    [place]  Final    Comment: Performed at [place] [facility] [address], [place], [place] [address]  Urine culture     Status: None   Collection Time: 10/06/18 12:54 AM   Specimen:  Urine, Random  Result Value Ref Range Status   Specimen Description   Final    URINE, RANDOM Performed at [facility] [address] [place], [place] [address]    Special Requests   Final    NONE Performed at [facility], [place] [address], [place], [place] [address]    Culture   Final    NO GROWTH Performed at [facility], [place] [address], [place], [place] [address]    Report Status 10/07/2018 FINAL  Final  Urine culture     Status: None   Collection Time: 10/06/18  7:11 AM   Specimen: Urine, Clean Catch  Result Value Ref Range Status   Specimen Description   Final    URINE, CLEAN CATCH Performed at [facility], [place] [address] [place], [place] [address]    Special Requests   Final    NONE Performed at [facility], [place] [address], [place], [place] [address]    Culture    Final    NO GROWTH Performed at [facility], [place] [address], [place],  [address]    Report Status 10/07/2018 FINAL  Final     Studies: No results found.  Scheduled Meds: . docusate sodium  100 mg Oral BID  . enoxaparin (LOVENOX) injection  40 mg Subcutaneous Q24H  . insulin pump   Subcutaneous TID AC, HS, 0200  . lisinopril  2.5 mg Oral Daily  . metoCLOPramide (REGLAN) injection  5 mg Intravenous Q8H  . ondansetron (ZOFRAN) IV  4 mg Intravenous Q8H  . pantoprazole (PROTONIX) IV  40 mg Intravenous Q24H  . scopolamine  1 patch Transdermal Q72H  . sodium chloride flush  10-40 mL Intracatheter Q12H   Continuous Infusions: . sodium chloride 100 mL/hr at 10/09/18 0011  . cefTRIAXone (ROCEPHIN)  IV 2 g (10/08/18 1205)  . erythromycin      Principal Problem:   Intractable nausea and vomiting Active Problems:   Uncontrolled type 1 diabetes mellitus (HCC)   Sinus tachycardia   Hypertension   Gastroparesis due to DM ([place])   Hydronephrosis of right kidney   Acute pyelonephritis      Desiree Hane  [facility]

## 2018-10-10 LAB — GLUCOSE, CAPILLARY
Glucose-Capillary: 128 mg/dL — ABNORMAL HIGH (ref 70–99)
Glucose-Capillary: 137 mg/dL — ABNORMAL HIGH (ref 70–99)
Glucose-Capillary: 159 mg/dL — ABNORMAL HIGH (ref 70–99)
Glucose-Capillary: 171 mg/dL — ABNORMAL HIGH (ref 70–99)
Glucose-Capillary: 238 mg/dL — ABNORMAL HIGH (ref 70–99)
Glucose-Capillary: 360 mg/dL — ABNORMAL HIGH (ref 70–99)

## 2018-10-10 LAB — BASIC METABOLIC PANEL
Anion gap: 9 (ref 5–15)
BUN: 9 mg/dL (ref 6–20)
CO2: 21 mmol/L — ABNORMAL LOW (ref 22–32)
Calcium: 8.7 mg/dL — ABNORMAL LOW (ref 8.9–10.3)
Chloride: 106 mmol/L (ref 98–111)
Creatinine, Ser: 0.57 mg/dL (ref 0.44–1.00)
GFR calc Af Amer: >60 mL/min (ref >60)
GFR calc non Af Amer: >60 mL/min (ref >60)
Glucose, Bld: 133 mg/dL — ABNORMAL HIGH (ref 70–99)
Potassium: 3.4 mmol/L — ABNORMAL LOW (ref 3.5–5.1)
Sodium: 136 mmol/L (ref 135–145)

## 2018-10-10 MED ORDER — CHLORHEXIDINE GLUCONATE CLOTH 2 % EX PADS
6.0000 | MEDICATED_PAD | Freq: Every day | CUTANEOUS | Status: DC
  Administered 2018-10-10 – 2018-10-16 (×7): 6 via TOPICAL

## 2018-10-10 MED ORDER — SODIUM CHLORIDE 0.9 % IV SOLN
INTRAVENOUS | Status: DC
  Administered 2018-10-10 – 2018-10-11 (×2): via INTRAVENOUS

## 2018-10-10 NOTE — Progress Notes (Signed)
Patient now is back to [place] at this time.

## 2018-10-10 NOTE — Progress Notes (Signed)
[facility]  PROGRESS NOTE  Irish Kupka [id number] DOB: [birth year] DOA: 10/05/2018 PCP: Vicenta Aly, FNP  Brief History    Vaniya Chagoya is a 28 y.o. year old female with medical history significant for type 1 diabetes complicated by frequent gastroparesis flares ( with significantly delayed gastric emptying study on, 06/2016) who presented on 10/05/2018 with nausea/vomiting, crampy abdominal pain since most recent discharge from the hospital (9/15-9-16) on 9/16 and was found to have intractable nausea vomiting presumed secondary to gastroparesis complicated by hypoglycemia episodes in a type I diabetic.  A & P     Intractable nausea and vomiting, presumed secondary to severe diabetic gastroparesis, improving.  Symptoms typical of her previous gastroparesis flares.  Doubt SBO/partial obstruction given still passing flatus.  Recent CT abdomen on 9/15 showed no acute abnormalities.  With addition of scheduled erythromycin not able to tolerate clear liquids with minimal vomiting, monitor and advance as tolerated, daily EKG (monitor QTC), good Reglan, decreased frequency of IV opioids  Encouraged  PRN Bentyl,    Sinus tachycardia, persists.  Likely due to dehydration in setting of diminished oral intake/persistent nausea vomiting.  We will continue IV maintenance fluids given diminished p.o. intake Closely monitor.   Type I diabetic with persistent hypoglycemia,A1c 8.7 (09/2018).  No hypoglycemic episodes in 24 hours, CBG 120s-130s, tolerating liquid diet.   suspend insulin pump for no more than 30 minutes while diminished p.o. intake, close monitoring of CBG.  Has intermittently required dextrose for hypoglycemia in the setting of poor oral intake/vomiting.    Right-sided hydronephrosis with over distended bladder.  Urology evaluated imaging on 9/16 (previous admission) recommended continuing Foley catheter, outpatient urodynamic evaluation.  Closely monitor output.  Currently  on day 5 of ceftriaxone given some concern for potential pyelonephritis patient is afebrile, no white count currently ( elevated on admission), UA wnl ( abnormal on the 15th).   Hypertension stable.  At goal.  Continue home lisinopril.     DVT prophylaxis: Lovenox Code Status: Full code Family Communication: No family at bedside Disposition Plan: Needs continued inpatient stay given inability to tolerate oral diet, intractable nausea/vomiting requiring IV pain control, scheduled IV antiemetics, IV dextrose for persistent hypoglycemia, close monitoring of CBGs    [facility] Direct contact: see [email or web address] (further directions at bottom of note if needed) 7PM-7AM contact night coverage as at bottom of note 10/10/2018, 5:15 PM  LOS: 4 days   Consultants  . none  Procedures  . None  Antibiotics  . Ceftriaxone  Interval History/Subjective  No vomiting since early this morning Feels added erythromycin has helped significantly Ready to continue trying liquid diet today  Objective   Vitals:  Vitals:   10/10/18 1507 10/10/18 1714  BP: (!) 150/95 132/89  Pulse: (!) 122 (!) 114  Resp: 17 16  Temp: 97.8 F (36.6 C) 98.9 F (37.2 C)  SpO2: 100% 100%    Exam:  Awake Alert, Oriented X 3, No new F.N deficits,  dry oral mucosa Symmetrical Chest wall movement Tachycardic Diminished bowel sounds, no rigidity, no guarding, no rebound     I have personally reviewed the following:   Data Reviewed: Basic Metabolic Panel: Recent Labs  Lab 10/06/18 1214 10/07/18 0505 10/08/18 0601 10/09/18 0421 10/10/18 1316  NA 139 140 137 136 136  K 3.8 2.9* 3.4* 4.0 3.4*  CL 109 108 102 107 106  CO2 22 23 21* 15* 21*  GLUCOSE 66* 42* 243* 264* 133*  BUN 9 5* 6 9 9  CREATININE 0.52 0.45 0.62 0.72 0.57  CALCIUM 8.5* 8.4* 8.6* 8.5* 8.7*  MG 1.7  --  1.9  --   --   PHOS 2.1*  --   --   --   --    Liver Function Tests: Recent Labs  Lab 10/04/18 0033 10/04/18 1351 10/06/18  0054 10/06/18 1214 10/07/18 0505  AST 22 19 29   --  26  ALT 13 16 19   --  17  ALKPHOS 99 121 89  --  79  BILITOT 1.2 0.8 0.7  --  0.7  PROT 8.5* 9.8* 7.7  --  6.5  ALBUMIN 4.4 5.4* 4.3 3.6 3.7   Recent Labs  Lab 10/04/18 0033 10/04/18 1351 10/06/18 0054  LIPASE 19 20 13    No results for input(s): AMMONIA in the last 168 hours. CBC: Recent Labs  Lab 10/04/18 1351 10/05/18 0027 10/06/18 0054 10/06/18 1214 10/07/18 0505 10/08/18 0601  WBC 20.1* 16.8* 15.3* 10.0 8.2 8.8  NEUTROABS 18.0*  --  13.2* 7.4  --   --   HGB 13.8 11.7* 13.3 11.7* 12.1 13.0  HCT 43.0 36.6 40.9 35.4* 36.9 40.7  MCV 89.2 91.0 89.3 86.6 88.3 89.1  PLT 458* 342 337 326 294 320   Cardiac Enzymes: No results for input(s): CKTOTAL, CKMB, CKMBINDEX, TROPONINI in the last 168 hours. BNP (last 3 results) No results for input(s): BNP in the last 8760 hours.  ProBNP (last 3 results) No results for input(s): PROBNP in the last 8760 hours.  CBG: Recent Labs  Lab 10/10/18 0031 10/10/18 0609 10/10/18 0733 10/10/18 1133 10/10/18 1630  GLUCAP 238* 171* 159* 137* 128*    Recent Results (from the past 240 hour(s))  Novel Coronavirus, NAA (Hosp order, Send-out to Ref Lab; TAT 18-24 hrs     Status: None   Collection Time: 10/04/18  6:33 PM   Specimen: Nasopharyngeal Swab; Respiratory  Result Value Ref Range Status   SARS-CoV-2, NAA NOT DETECTED NOT DETECTED Final    Comment: (NOTE) This nucleic acid amplification test was developed and its performance characteristics determined by [employer]. Nucleic acid amplification tests include PCR and TMA. This test has not been FDA cleared or approved. This test has been authorized by FDA under an Emergency Use Authorization (EUA). This test is only authorized for the duration of time the declaration that circumstances exist justifying the authorization of the emergency use of in vitro diagnostic tests for detection of SARS-CoV-2 virus and/or  diagnosis of COVID-19 infection under section 564(b)(1) of the Act, 21 U.S.C. [id number]) (1), unless the authorization is terminated or revoked sooner. When diagnostic testing is negative, the possibility of a false negative result should be considered in the context of a patient's recent exposures and the presence of clinical signs and symptoms consistent with COVID-19. An individual without symptoms of COVID- 19 and who is not shedding SARS-CoV-2 vi rus would expect to have a negative (not detected) result in this assay. Performed At: [facility] [place], [place] [id number] Rush Farmer MD [id number]    [place]  Final    Comment: Performed at [facility] [address], [place], [place] [address]  Urine culture     Status: None   Collection Time: 10/06/18 12:54 AM   Specimen: Urine, Random  Result Value Ref Range Status   Specimen Description   Final    URINE, RANDOM Performed at [facility] [address] [place], [place] [address]  Special Requests   Final    NONE Performed at [facility], [place] [address], [place], [place] [address]    Culture   Final    NO GROWTH Performed at [facility], [place] [address], [place], [place] [address]    Report Status 10/07/2018 FINAL  Final  Urine culture     Status: None   Collection Time: 10/06/18  7:11 AM   Specimen: Urine, Clean Catch  Result Value Ref Range Status   Specimen Description   Final    URINE, CLEAN CATCH Performed at [facility], [place] [address] [place], [place] [address]    Special Requests   Final    NONE Performed at [facility], [place] [address], [place], [place] [address]    Culture   Final    NO GROWTH Performed at [facility], [place] [address], [place],  [address]    Report Status 10/07/2018 FINAL  Final     Studies: No results  found.  Scheduled Meds: . Chlorhexidine Gluconate Cloth  6 each Topical Daily  . dicyclomine  10 mg Oral TID AC  . docusate sodium  100 mg Oral BID  . enoxaparin (LOVENOX) injection  40 mg Subcutaneous Q24H  . insulin pump   Subcutaneous TID AC, HS, 0200  . lisinopril  2.5 mg Oral Daily  . metoCLOPramide (REGLAN) injection  5 mg Intravenous Q8H  . ondansetron (ZOFRAN) IV  4 mg Intravenous Q8H  . pantoprazole (PROTONIX) IV  40 mg Intravenous Q24H  . scopolamine  1 patch Transdermal Q72H  . sodium chloride flush  10-40 mL Intracatheter Q12H   Continuous Infusions: . erythromycin 250 mg (10/10/18 1715)    Principal Problem:   Intractable nausea and vomiting Active Problems:   Uncontrolled type 1 diabetes mellitus ([place])   Sinus tachycardia   Hypertension   Gastroparesis due to DM ([place])   Hydronephrosis of right kidney   Acute pyelonephritis      Desiree Hane  [facility]

## 2018-10-10 NOTE — Progress Notes (Signed)
   Vital Signs MEWS/VS Documentation      10/09/2018 1227 10/09/2018 1627 10/09/2018 2142 10/10/2018 0040   MEWS Score:  2  1  1  2    MEWS Score Color:  Yellow  Green  Green  Yellow   Resp:  20  18  17  19    Pulse:  (!) 118  (!) 106  (!) 110  (!) 115   BP:  (!) 152/103  136/86  (!) 145/99  125/88   Temp:  99.2 F (37.3 C)  98.7 F (37.1 C)  98.9 F (37.2 C)  98.9 F (37.2 C)   O2 Device:  Room [employer]  -  -           Roderick Pee 10/10/2018,12:51 AM  Patient again has a yellow MEWS score. This is not a change from baseline. Her HR has been elevated and MD aware. Her heart rate is 115 at this time. Will continue to monitor.Roderick Pee

## 2018-10-11 ENCOUNTER — Inpatient Hospital Stay (HOSPITAL_COMMUNITY): Payer: Medicaid Other

## 2018-10-11 LAB — GLUCOSE, CAPILLARY
Glucose-Capillary: 149 mg/dL — ABNORMAL HIGH (ref 70–99)
Glucose-Capillary: 51 mg/dL — ABNORMAL LOW (ref 70–99)
Glucose-Capillary: 63 mg/dL — ABNORMAL LOW (ref 70–99)
Glucose-Capillary: 147 mg/dL — ABNORMAL HIGH (ref 70–99)
Glucose-Capillary: 179 mg/dL — ABNORMAL HIGH (ref 70–99)
Glucose-Capillary: 98 mg/dL (ref 70–99)
Glucose-Capillary: 109 mg/dL — ABNORMAL HIGH (ref 70–99)
Glucose-Capillary: 85 mg/dL (ref 70–99)

## 2018-10-11 LAB — BASIC METABOLIC PANEL
Anion gap: 7 (ref 5–15)
BUN: 10 mg/dL (ref 6–20)
CO2: 23 mmol/L (ref 22–32)
Calcium: 8.4 mg/dL — ABNORMAL LOW (ref 8.9–10.3)
Chloride: 107 mmol/L (ref 98–111)
Creatinine, Ser: 0.6 mg/dL (ref 0.44–1.00)
GFR calc Af Amer: >60 mL/min (ref >60)
GFR calc non Af Amer: >60 mL/min (ref >60)
Glucose, Bld: 76 mg/dL (ref 70–99)
Potassium: 3.1 mmol/L — ABNORMAL LOW (ref 3.5–5.1)
Sodium: 137 mmol/L (ref 135–145)

## 2018-10-11 MED ORDER — SODIUM CHLORIDE 0.9 % IV SOLN
250.0000 mg | Freq: Three times a day (TID) | INTRAVENOUS | Status: DC
  Administered 2018-10-12 (×2): 250 mg via INTRAVENOUS
  Filled 2018-10-11 (×4): qty 5

## 2018-10-11 MED ORDER — ERYTHROMYCIN BASE 250 MG PO TABS
250.0000 mg | ORAL_TABLET | Freq: Three times a day (TID) | ORAL | Status: DC
  Filled 2018-10-11 (×2): qty 1

## 2018-10-11 MED ORDER — METOCLOPRAMIDE HCL 10 MG PO TABS
10.0000 mg | ORAL_TABLET | Freq: Three times a day (TID) | ORAL | Status: DC
  Administered 2018-10-12 (×2): 10 mg via ORAL
  Filled 2018-10-11 (×3): qty 1

## 2018-10-11 MED ORDER — PROMETHAZINE HCL 25 MG/ML IJ SOLN
12.5000 mg | Freq: Four times a day (QID) | INTRAMUSCULAR | Status: DC | PRN
  Administered 2018-10-11 – 2018-10-14 (×11): 12.5 mg via INTRAVENOUS
  Filled 2018-10-11 (×10): qty 1

## 2018-10-11 MED ORDER — PANTOPRAZOLE SODIUM 40 MG PO TBEC
40.0000 mg | DELAYED_RELEASE_TABLET | Freq: Every day | ORAL | Status: DC
  Administered 2018-10-12 – 2018-10-16 (×3): 40 mg via ORAL
  Filled 2018-10-11 (×4): qty 1

## 2018-10-11 MED ORDER — POTASSIUM CHLORIDE 20 MEQ PO PACK
40.0000 meq | PACK | Freq: Once | ORAL | Status: AC
  Administered 2018-10-12: 40 meq via ORAL
  Filled 2018-10-11 (×4): qty 2

## 2018-10-11 NOTE — Progress Notes (Signed)
Hypoglycemic Event  CBG: 51  Treatment:Juice 8oz  Symptoms: No symptoms  Follow-up CBG: Time: around 0605am CBG Result: 63  Possible Reasons for Event: Brittle diabetic  Comments/MD notified:N/A; protocol followed.     [name]

## 2018-10-11 NOTE — Progress Notes (Signed)
[facility]  PROGRESS NOTE  [name] [id number] DOB: 03-01-1990 DOA: 10/05/2018 PCP: Vicenta Aly, FNP  Brief History    [name] is a 28 y.o. year old female with medical history significant for type 1 diabetes complicated by frequent gastroparesis flares ( with significantly delayed gastric emptying study on, 06/2016) who presented on 10/05/2018 with nausea/vomiting, crampy abdominal pain since most recent discharge from the hospital (9/15-9-16) on 9/16 and was found to have intractable nausea vomiting presumed secondary to gastroparesis complicated by hypoglycemia episodes in a type I diabetic.  A & P     Intractable nausea and vomiting, presumed secondary to severe diabetic gastroparesis, improving.  Symptoms typical of her previous gastroparesis flares.  Doubt SBO/partial obstruction given still passing flatus.  Recent CT abdomen on 9/15 showed no acute abnormalities.  Addition of scheduled IV erythromycin to IV Reglan has greatly improved her ability to tolerate clear liquids, will advance to regular diet and change to scheduled oral erythromycin and Reglan.  If maintains adequate diet without recurrent hypoglycemia/intractable nausea vomiting anticipate discharge next 24 hours.   daily EKG (monitor QTC), decreased frequency of IV opioids  Encouraged  PRN Bentyl,    Sinus tachycardia, resolved.  Likely due to dehydration in setting of diminished oral intake/persistent nausea vomiting.  Can discontinue IV fluids given inability to tolerate clear liquids and moving to oral intake.   Type I diabetic with persistent hypoglycemia,A1c 8.7 (09/2018).   tolerating liquid diet but had episode of hypoglycemia this morning to 51.  Very brittle type I diabetic.  Diabetes coordinator consulted, patient advised to continue to cover any carbohydrate intake with her insulin pump to avoid resultant hypoglycemia, continue insulin pump order set.   suspend insulin pump for no more  than 30 minutes while diminished p.o. intake, close monitoring of CBG.  Has intermittently required dextrose for hypoglycemia in the setting of poor oral intake/vomiting.    Right-sided hydronephrosis with over distended bladder.  Urology evaluated imaging on 9/16 (previous admission) recommended continuing Foley catheter, outpatient urodynamic evaluation.  Closely monitor output.  Completed 5 days of ceftriaxone given some concern for potential pyelonephritis patient is afebrile, no white count currently ( elevated on admission), UA wnl ( abnormal on the 15th).   Hypertension stable.  At goal.  Continue home lisinopril.     DVT prophylaxis: Lovenox Code Status: Full code Family Communication: No family at bedside Disposition Plan: Needs continued inpatient stay to monitor ability to tolerate regular diet and maintain adequate liquid nutrition without signs of dehydration/sinus tachycardia/intractable nausea/vomiting.  Will likely need erythromycin in addition to Reglan for severe diabetic gastroparesis   [facility] Direct contact: see [email or web address] (further directions at bottom of note if needed) 7PM-7AM contact night coverage as at bottom of note 10/11/2018, 1:59 PM  LOS: 5 days   Consultants  . none  Procedures  . None  Antibiotics  . Ceftriaxone  Interval History/Subjective  No vomiting since early yesterday morning Ready to try regular diet Belly pain minimal  Objective   Vitals:  Vitals:   10/11/18 1147 10/11/18 1257  BP: 101/76 111/75  Pulse: 94 93  Resp: 16 19  Temp: 97.9 F (36.6 C) 98.5 F (36.9 C)  SpO2: 100% 100%    Exam:  [name], sitting in bedside chair awake Alert, Oriented X 3, No new F.N deficits,  Moist oral mucosa Symmetrical Chest wall movement Normal rate, normal rhythm, no edema Diminished bowel sounds, no rigidity, no guarding, no rebound  I have personally reviewed the following:   Data Reviewed: Basic  Metabolic Panel: Recent Labs  Lab 10/06/18 1214 10/07/18 0505 10/08/18 0601 10/09/18 0421 10/10/18 1316 10/11/18 0526  NA 139 140 137 136 136 137  K 3.8 2.9* 3.4* 4.0 3.4* 3.1*  CL 109 108 102 107 106 107  CO2 22 23 21* 15* 21* 23  GLUCOSE 66* 42* 243* 264* 133* 76  BUN 9 5* 6 9 9 10   CREATININE 0.52 0.45 0.62 0.72 0.57 0.60  CALCIUM 8.5* 8.4* 8.6* 8.5* 8.7* 8.4*  MG 1.7  --  1.9  --   --   --   PHOS 2.1*  --   --   --   --   --    Liver Function Tests: Recent Labs  Lab 10/06/18 0054 10/06/18 1214 10/07/18 0505  AST 29  --  26  ALT 19  --  17  ALKPHOS 89  --  79  BILITOT 0.7  --  0.7  PROT 7.7  --  6.5  ALBUMIN 4.3 3.6 3.7   Recent Labs  Lab 10/06/18 0054  LIPASE 13   No results for input(s): AMMONIA in the last 168 hours. CBC: Recent Labs  Lab 10/05/18 0027 10/06/18 0054 10/06/18 1214 10/07/18 0505 10/08/18 0601  WBC 16.8* 15.3* 10.0 8.2 8.8  NEUTROABS  --  13.2* 7.4  --   --   HGB 11.7* 13.3 11.7* 12.1 13.0  HCT 36.6 40.9 35.4* 36.9 40.7  MCV 91.0 89.3 86.6 88.3 89.1  PLT 342 337 326 294 320   Cardiac Enzymes: No results for input(s): CKTOTAL, CKMB, CKMBINDEX, TROPONINI in the last 168 hours. BNP (last 3 results) No results for input(s): BNP in the last 8760 hours.  ProBNP (last 3 results) No results for input(s): PROBNP in the last 8760 hours.  CBG: Recent Labs  Lab 10/11/18 0541 10/11/18 0608 10/11/18 0643 10/11/18 0718 10/11/18 1142  GLUCAP 51* 63* 147* 179* 98    Recent Results (from the past 240 hour(s))  Novel Coronavirus, NAA (Hosp order, Send-out to Ref Lab; TAT 18-24 hrs     Status: None   Collection Time: 10/04/18  6:33 PM   Specimen: Nasopharyngeal Swab; Respiratory  Result Value Ref Range Status   SARS-CoV-2, NAA NOT DETECTED NOT DETECTED Final    Comment: (NOTE) This nucleic acid amplification test was developed and its performance characteristics determined by [employer]. Nucleic acid amplification tests  include PCR and TMA. This test has not been FDA cleared or approved. This test has been authorized by FDA under an Emergency Use Authorization (EUA). This test is only authorized for the duration of time the declaration that circumstances exist justifying the authorization of the emergency use of in vitro diagnostic tests for detection of SARS-CoV-2 virus and/or diagnosis of COVID-19 infection under section 564(b)(1) of the Act, 21 U.S.C. [id number]) (1), unless the authorization is terminated or revoked sooner. When diagnostic testing is negative, the possibility of a false negative result should be considered in the context of a patient's recent exposures and the presence of clinical signs and symptoms consistent with COVID-19. An individual without symptoms of COVID- 19 and who is not shedding SARS-CoV-2 vi rus would expect to have a negative (not detected) result in this assay. Performed At: [facility] [place], [place] [id number] Rush Farmer MD [id number]    [place]  Final    Comment: Performed at [place] [address]  [place], [place] [address]  Urine culture     Status: None   Collection Time: 10/06/18 12:54 AM   Specimen: Urine, Random  Result Value Ref Range Status   Specimen Description   Final    URINE, RANDOM Performed at [place] [address] [place], [place] [address]    Special Requests   Final    NONE Performed at [facility], [place] [address] [place], [place] [address]    Culture   Final    NO GROWTH Performed at [facility], [place] [address] [place], [place] [address]    Report Status 10/07/2018 FINAL  Final  Urine culture     Status: None   Collection Time: 10/06/18  7:11 AM   Specimen: Urine, Clean Catch  Result Value Ref Range Status   Specimen Description   Final    URINE, CLEAN CATCH Performed at [facility], [place] [address], [place], [place] [address]    Special Requests   Final    NONE Performed at [facility], [place] [address], [place], [place] [address]    Culture   Final    NO GROWTH Performed at [facility], [place] [address], [place], [place] [address]    Report Status 10/07/2018 FINAL  Final     Studies: No results found.  Scheduled Meds: . Chlorhexidine Gluconate Cloth  6 each Topical Daily  . dicyclomine  10 mg Oral TID AC  . docusate sodium  100 mg Oral BID  . enoxaparin (LOVENOX) injection  40 mg Subcutaneous Q24H  . erythromycin  250 mg Oral TID WC & HS  . insulin pump   Subcutaneous TID AC, HS, 0200  . lisinopril  2.5 mg Oral Daily  . metoCLOPramide  10 mg Oral TID WC  . ondansetron (ZOFRAN) IV  4 mg Intravenous Q8H  . pantoprazole  40 mg Oral Daily  . potassium chloride  40 mEq Oral Once  . scopolamine  1 patch Transdermal Q72H  . sodium chloride flush  10-40 mL Intracatheter Q12H   Continuous Infusions: . sodium chloride 100 mL/hr at 10/11/18 [id number]    Principal Problem:   Intractable nausea and vomiting Active Problems:   Uncontrolled type 1 diabetes mellitus ([place])   Sinus tachycardia   Hypertension   Gastroparesis due to DM ([place])   Hydronephrosis of right kidney   Acute pyelonephritis      Desiree Hane  [facility]

## 2018-10-11 NOTE — Progress Notes (Signed)
Inpatient Diabetes Program Recommendations  AACE/ADA: New Consensus Statement on Inpatient Glycemic Control (2015)  Target Ranges:  Prepandial:   less than 140 mg/dL      Peak postprandial:   less than 180 mg/dL (1-2 hours)      Critically ill patients:  140 - 180 mg/dL   Lab Results  Component Value Date   GLUCAP 179 (H) 10/11/2018   HGBA1C 8.7 (H) 10/07/2018    Review of Glycemic Control  Diabetes history: DM 1 Outpatient Diabetes medications: medtronic insulin pump Current orders for Inpatient glycemic control: insulin pump via order set  Inpatient Diabetes Program Recommendations:    Pt is brittle with her glucose trends due to lack on glucose control in the past. Pt very familiar to our team. Pt having erratic blood sugars inpatient.  Pt needs to make sure to cover all carbohydrates consumed with insulin regardless of percentage of meal intake to prevent hyperglycemia. This will decrease the hypoglycemia response after the pt covers her Hyperglycemia.  Thanks,  Tama Headings RN, MSN, BC-ADM Inpatient Diabetes Coordinator Team Pager [phone] (8a-5p)

## 2018-10-12 ENCOUNTER — Inpatient Hospital Stay: Payer: Self-pay

## 2018-10-12 LAB — GLUCOSE, CAPILLARY
Glucose-Capillary: 408 mg/dL — ABNORMAL HIGH (ref 70–99)
Glucose-Capillary: 209 mg/dL — ABNORMAL HIGH (ref 70–99)
Glucose-Capillary: 243 mg/dL — ABNORMAL HIGH (ref 70–99)
Glucose-Capillary: 99 mg/dL (ref 70–99)
Glucose-Capillary: 127 mg/dL — ABNORMAL HIGH (ref 70–99)

## 2018-10-12 LAB — BASIC METABOLIC PANEL
Anion gap: 10 (ref 5–15)
BUN: 12 mg/dL (ref 6–20)
CO2: 20 mmol/L — ABNORMAL LOW (ref 22–32)
Calcium: 8.7 mg/dL — ABNORMAL LOW (ref 8.9–10.3)
Chloride: 105 mmol/L (ref 98–111)
Creatinine, Ser: 0.86 mg/dL (ref 0.44–1.00)
GFR calc Af Amer: >60 mL/min (ref >60)
GFR calc non Af Amer: >60 mL/min (ref >60)
Glucose, Bld: 315 mg/dL — ABNORMAL HIGH (ref 70–99)
Potassium: 3.3 mmol/L — ABNORMAL LOW (ref 3.5–5.1)
Sodium: 135 mmol/L (ref 135–145)

## 2018-10-12 LAB — CBC
HCT: 35.9 % — ABNORMAL LOW (ref 36.0–46.0)
Hemoglobin: 11.8 g/dL — ABNORMAL LOW (ref 12.0–15.0)
MCH: 28.9 pg (ref 26.0–34.0)
MCHC: 32.9 g/dL (ref 30.0–36.0)
MCV: 87.8 fL (ref 80.0–100.0)
Platelets: 361 10*3/uL (ref 150–400)
RBC: 4.09 MIL/uL (ref 3.87–5.11)
RDW: 13.5 % (ref 11.5–15.5)
WBC: 12.2 10*3/uL — ABNORMAL HIGH (ref 4.0–10.5)
nRBC: 0 % (ref 0.0–0.2)

## 2018-10-12 MED ORDER — SODIUM CHLORIDE 0.9% FLUSH: 10.0000 mL | INTRAVENOUS | Status: DC | PRN

## 2018-10-12 MED ORDER — SODIUM CHLORIDE 0.9 % IV SOLN
INTRAVENOUS | Status: DC
  Administered 2018-10-12 – 2018-10-14 (×4): via INTRAVENOUS

## 2018-10-12 MED ORDER — METOCLOPRAMIDE HCL 10 MG PO TABS
10.0000 mg | ORAL_TABLET | Freq: Three times a day (TID) | ORAL | Status: DC
  Administered 2018-10-13 – 2018-10-16 (×8): 10 mg via ORAL
  Filled 2018-10-12 (×10): qty 1

## 2018-10-12 MED ORDER — POTASSIUM CHLORIDE CRYS ER 20 MEQ PO TBCR
40.0000 meq | EXTENDED_RELEASE_TABLET | Freq: Once | ORAL | Status: AC
  Administered 2018-10-12: 40 meq via ORAL
  Filled 2018-10-12: qty 2

## 2018-10-12 MED ORDER — METOCLOPRAMIDE HCL 5 MG/ML IJ SOLN: 5.0000 mg | Freq: Three times a day (TID) | INTRAMUSCULAR | Status: DC

## 2018-10-12 MED ORDER — ONDANSETRON HCL 4 MG/2ML IJ SOLN
4.0000 mg | INTRAMUSCULAR | Status: DC | PRN
  Administered 2018-10-13 – 2018-10-14 (×5): 4 mg via INTRAVENOUS
  Filled 2018-10-12 (×6): qty 2

## 2018-10-12 MED ORDER — SODIUM CHLORIDE 0.9% FLUSH
10.0000 mL | Freq: Two times a day (BID) | INTRAVENOUS | Status: DC
  Administered 2018-10-12 – 2018-10-15 (×3): 10 mL

## 2018-10-12 MED ORDER — POTASSIUM CHLORIDE 10 MEQ/100ML IV SOLN: 10.0000 meq | INTRAVENOUS | Status: DC

## 2018-10-12 NOTE — Progress Notes (Signed)
Peripherally Inserted Central Catheter/Midline Placement  The IV Nurse has discussed with the patient and/or persons authorized to consent for the patient, the purpose of this procedure and the potential benefits and risks involved with this procedure.  The benefits include less needle sticks, lab draws from the catheter, and the patient may be discharged home with the catheter. Risks include, but not limited to, infection, bleeding, blood clot (thrombus formation), and puncture of an artery; nerve damage and irregular heartbeat and possibility to perform a PICC exchange if needed/ordered by physician.  Alternatives to this procedure were also discussed.  Bard Power PICC patient education guide, fact sheet on infection prevention and patient information card has been provided to patient /or left at bedside.    PICC/Midline Placement Documentation  PICC Single Lumen 10/12/18 PICC Right Brachial 33 cm 0 cm (Active)  Indication for Insertion or Continuance of Line Poor Vasculature-patient has had multiple peripheral attempts or PIVs lasting less than 24 hours 10/12/18 1843  Exposed Catheter (cm) 0 cm 10/12/18 1843  Site Assessment Clean;Dry;Intact 10/12/18 1843  Line Status Flushed;Blood return noted 10/12/18 1843  Dressing Type Transparent 10/12/18 1843  Dressing Status Clean;Dry;Intact;Antimicrobial disc in place 10/12/18 1843  Dressing Intervention New dressing 10/12/18 1843  Dressing Change Due 10/19/18 10/12/18 1843       [name] 10/12/2018, 6:45 PM

## 2018-10-12 NOTE — Progress Notes (Signed)
PROGRESS NOTE    [name]  [id number] DOB: 1990-11-20 DOA: 10/05/2018 PCP: Vicenta Aly, FNP    Brief Narrative:  28 year old female who presented with nausea.  She does have significant past medical history for type 1 diabetes mellitus and gastroparesis.  Reported intractable nausea and vomiting for about 3 days.  She had multiple visits to the emergency room without improvement.  On her initial physical examination blood pressure 136/95, heart rate 103, respiratory rate 14, oxygen saturation 99%, her lungs are clear to auscultation bilaterally, heart S1-S2 present and rhythmic, abdomen soft and nontender, positive bowel sounds, no lower extremity edema.  CT of the abdomen and pelvis with severe right hydroureteronephrosis, distended bladder with mild focal areas of bladder wall thickening.  New since November 2019.  Dilated right ureter extending to the bladder without obstructing cause identified.  Wedge-shaped areas of decreased attenuation with right kidney of uncertain chronicity, possible pyelonephritis.  Patient was admitted to the hospital with the working diagnosis of intractable nausea and vomiting likely due to gastroparesis complicated by possible right pyelonephritis with obstructive uropathy.  Assessment & Plan:   Principal Problem:   Intractable nausea and vomiting Active Problems:   Uncontrolled type 1 diabetes mellitus ([facility])   Sinus tachycardia   Hypertension   Gastroparesis due to DM ([facility])   Hydronephrosis of right kidney   Acute pyelonephritis   1. Acute flare of diabetic gastroparesis. Patient continue to have intractable nausea and vomiting. Will change metoclopramide to tid schedule, continue clears for now. Continue pain control and IV fluids.   2. T1DM./ uncontrolled/ hypoglycemia. Will continue glucose control, patient with insulin therapy per pump.   3. Urinary retention with right hydronephrosis. Patient had with foley catheter in place,  follow renal US with resolved hydronephrosis. Will dc home with catheter and follow up with urology as outpatient.   4. HTN. Continue blood pressure monitoring.   Patient loss her IV, unable to get peripheral access and need of IV medications to control her symptoms consulted IV team for PICC line.   DVT prophylaxis: enoxaparin   Code Status: full Family Communication:  No family at the bedside  Disposition Plan/ discharge barriers: pending clinical improvement.   Body mass index is 23.82 kg/m. Malnutrition Type:      Malnutrition Characteristics:      Nutrition Interventions:     RN Pressure Injury Documentation:     Consultants:     Procedures:     Antimicrobials:       Subjective: Patient continue to have significant nausea and vomiting, not tolerating po well. Associated abdominal pain.   Objective: Vitals:   10/11/18 1257 10/11/18 2000 10/12/18 0658 10/12/18 0916  BP: 111/75  122/69 114/83  Pulse: 93  (!) 115 (!) 113  Resp: 19  18 18   Temp: 98.5 F (36.9 C)  97.8 F (36.6 C) 99.3 F (37.4 C)  TempSrc: Oral Other (Comment) Oral Oral  SpO2: 100%  99% 97%  Weight:      Height:        Intake/Output Summary (Last 24 hours) at 10/12/2018 1207 Last data filed at 10/12/2018 0742 Gross per 24 hour  Intake 240 ml  Output 2401 ml  Net -2161 ml   Filed Weights   10/06/18 1614  Weight: 61 kg    Examination:   General: Not in pain or dyspnea, deconditioned and ill looking appearing.  Neurology: Awake and alert, non focal  E ENT: mild pallor, no icterus, oral mucosa moist Cardiovascular: No  JVD. S1-S2 present, rhythmic, no gallops, rubs, or murmurs. No lower extremity edema. Pulmonary: positive breath sounds bilaterally, adequate air movement, no wheezing, rhonchi or rales. Gastrointestinal. Abdomen mild distention with no organomegaly, non tender, no rebound or guarding Skin. No rashes Musculoskeletal: no joint deformities     Data  Reviewed: I have personally reviewed following labs and imaging studies  CBC: Recent Labs  Lab 10/06/18 0054 10/06/18 1214 10/07/18 0505 10/08/18 0601 10/12/18 0520  WBC 15.3* 10.0 8.2 8.8 12.2*  NEUTROABS 13.2* 7.4  --   --   --   HGB 13.3 11.7* 12.1 13.0 11.8*  HCT 40.9 35.4* 36.9 40.7 35.9*  MCV 89.3 86.6 88.3 89.1 87.8  PLT 337 326 294 320 [id number]   Basic Metabolic Panel: Recent Labs  Lab 10/06/18 1214  10/08/18 0601 10/09/18 0421 10/10/18 1316 10/11/18 0526 10/12/18 0520  NA 139   < > 137 136 136 137 135  K 3.8   < > 3.4* 4.0 3.4* 3.1* 3.3*  CL 109   < > 102 107 106 107 105  CO2 22   < > 21* 15* 21* 23 20*  GLUCOSE 66*   < > 243* 264* 133* 76 315*  BUN 9   < > 6 9 9 10 12   CREATININE 0.52   < > 0.62 0.72 0.57 0.60 0.86  CALCIUM 8.5*   < > 8.6* 8.5* 8.7* 8.4* 8.7*  MG 1.7  --  1.9  --   --   --   --   PHOS 2.1*  --   --   --   --   --   --    < > = values in this interval not displayed.   GFR: Estimated Creatinine Clearance: 80.6 mL/min (by C-G formula based on SCr of 0.86 mg/dL). Liver Function Tests: Recent Labs  Lab 10/06/18 0054 10/06/18 1214 10/07/18 0505  AST 29  --  26  ALT 19  --  17  ALKPHOS 89  --  79  BILITOT 0.7  --  0.7  PROT 7.7  --  6.5  ALBUMIN 4.3 3.6 3.7   Recent Labs  Lab 10/06/18 0054  LIPASE 13   No results for input(s): AMMONIA in the last 168 hours. Coagulation Profile: No results for input(s): INR, PROTIME in the last 168 hours. Cardiac Enzymes: No results for input(s): CKTOTAL, CKMB, CKMBINDEX, TROPONINI in the last 168 hours. BNP (last 3 results) No results for input(s): PROBNP in the last 8760 hours. HbA1C: No results for input(s): HGBA1C in the last 72 hours. CBG: Recent Labs  Lab 10/11/18 1829 10/12/18 0146 10/12/18 0654 10/12/18 0736 10/12/18 1153  GLUCAP 85 408* 209* 243* 99   Lipid Profile: No results for input(s): CHOL, HDL, LDLCALC, TRIG, CHOLHDL, LDLDIRECT in the last 72 hours. Thyroid Function Tests:  No results for input(s): TSH, T4TOTAL, FREET4, T3FREE, THYROIDAB in the last 72 hours. Anemia Panel: No results for input(s): VITAMINB12, FOLATE, FERRITIN, TIBC, IRON, RETICCTPCT in the last 72 hours.    Radiology Studies: I have reviewed all of the imaging during this hospital visit personally     Scheduled Meds: . Chlorhexidine Gluconate Cloth  6 each Topical Daily  . dicyclomine  10 mg Oral TID AC  . docusate sodium  100 mg Oral BID  . enoxaparin (LOVENOX) injection  40 mg Subcutaneous Q24H  . insulin pump   Subcutaneous TID AC, HS, 0200  . lisinopril  2.5 mg Oral Daily  . metoCLOPramide  10 mg Oral TID WC  . ondansetron (ZOFRAN) IV  4 mg Intravenous Q8H  . pantoprazole  40 mg Oral Daily  . scopolamine  1 patch Transdermal Q72H  . sodium chloride flush  10-40 mL Intracatheter Q12H   Continuous Infusions: . erythromycin 250 mg (10/12/18 0949)     LOS: 6 days        Mauricio Gerome Apley, MD

## 2018-10-12 NOTE — Progress Notes (Signed)
Patient is triggering yellow for MEWS due to increased pulse rate. This is not an acute change for the patient. Patient's pulse has been going up & down since admission. Patient's other vitals are stable and shows no signs of distress.When patient is in pain pulse tends to increase. Dr. Cathlean Sauer still notified of vitals.

## 2018-10-13 LAB — BASIC METABOLIC PANEL
Anion gap: 10 (ref 5–15)
BUN: 15 mg/dL (ref 6–20)
CO2: 23 mmol/L (ref 22–32)
Calcium: 8.6 mg/dL — ABNORMAL LOW (ref 8.9–10.3)
Chloride: 104 mmol/L (ref 98–111)
Creatinine, Ser: 0.62 mg/dL (ref 0.44–1.00)
GFR calc Af Amer: >60 mL/min (ref >60)
GFR calc non Af Amer: >60 mL/min (ref >60)
Glucose, Bld: 83 mg/dL (ref 70–99)
Potassium: 3 mmol/L — ABNORMAL LOW (ref 3.5–5.1)
Sodium: 137 mmol/L (ref 135–145)

## 2018-10-13 LAB — GLUCOSE, CAPILLARY
Glucose-Capillary: 102 mg/dL — ABNORMAL HIGH (ref 70–99)
Glucose-Capillary: 109 mg/dL — ABNORMAL HIGH (ref 70–99)
Glucose-Capillary: 145 mg/dL — ABNORMAL HIGH (ref 70–99)
Glucose-Capillary: 168 mg/dL — ABNORMAL HIGH (ref 70–99)
Glucose-Capillary: 252 mg/dL — ABNORMAL HIGH (ref 70–99)
Glucose-Capillary: 55 mg/dL — ABNORMAL LOW (ref 70–99)
Glucose-Capillary: 56 mg/dL — ABNORMAL LOW (ref 70–99)
Glucose-Capillary: 92 mg/dL (ref 70–99)

## 2018-10-13 MED ORDER — POTASSIUM CHLORIDE CRYS ER 20 MEQ PO TBCR
40.0000 meq | EXTENDED_RELEASE_TABLET | ORAL | Status: AC
  Filled 2018-10-13: qty 2

## 2018-10-13 NOTE — Progress Notes (Addendum)
MEWS SCORE BACK TO GREEN - Will do another check in 2 hours, then every 4 hours x 4

## 2018-10-13 NOTE — Progress Notes (Signed)
Mews Score is back to yellow due to fluctuating pulse - this is not an acute change - pulse has been up and down since admission.

## 2018-10-13 NOTE — Progress Notes (Signed)
PROGRESS NOTE    [name]  [id number] DOB: Apr 13, 1990 DOA: 10/05/2018 PCP: Vicenta Aly, FNP    Brief Narrative:  28 year old female who presented with nausea.  She does have significant past medical history for type 1 diabetes mellitus and gastroparesis.  Reported intractable nausea and vomiting for about 3 days.  She had multiple visits to the emergency room without improvement.  On her initial physical examination blood pressure 136/95, heart rate 103, respiratory rate 14, oxygen saturation 99%, her lungs are clear to auscultation bilaterally, heart S1-S2 present and rhythmic, abdomen soft and nontender, positive bowel sounds, no lower extremity edema.  CT of the abdomen and pelvis with severe right hydroureteronephrosis, distended bladder with mild focal areas of bladder wall thickening.  New since November 2019.  Dilated right ureter extending to the bladder without obstructing cause identified.  Wedge-shaped areas of decreased attenuation with right kidney of uncertain chronicity, possible pyelonephritis.  Patient was admitted to the hospital with the working diagnosis of intractable nausea and vomiting likely due to gastroparesis complicated by possible right pyelonephritis with obstructive uropathy.  Patient completed 5 days of IV antibiotic therapy with ceftriaxone. Continue to have nausea and vomiting with very poor oral intake.   Assessment & Plan:   Principal Problem:   Intractable nausea and vomiting Active Problems:   Uncontrolled type 1 diabetes mellitus (HCC)   Sinus tachycardia   Hypertension   Gastroparesis due to DM (HCC)   Hydronephrosis of right kidney   Acute pyelonephritis    1. Acute flare of diabetic gastroparesis. Intermittent symptoms, still not yet back to baseline, continue to have nausea and vomiting. Continue tid metoclopramide for nausea control and will change diet to soft for now. Continue antiacid therapy. Now patient with PICC line in  place. Reactive leukocytosis, will check cbc in am.   2. T1DM./ uncontrolled/ hypoglycemia. Capillary glucose 99, 127, 145, 92, 56. Patient using her insulin pump, will continue glucose cover and monitoring. Diet changed to soft.   3. Urinary retention with right hydronephrosis. follow renal US with resolved hydronephrosis. Plan to continue foley catheter at discharge.  4. HTN. Continue blood pressure monitoring.   5. Hypokalemia. Likely due to gastrointestinal losses, will continue K correction with Kcl, 80 meq today, will follow on renal function in am. Continue IV isotonic saline.    DVT prophylaxis: enoxaparin   Code Status: full Family Communication:  No family at the bedside  Disposition Plan/ discharge barriers: pending clinical improvement.    Body mass index is 23.82 kg/m. Malnutrition Type:      Malnutrition Characteristics:      Nutrition Interventions:     RN Pressure Injury Documentation:     Consultants:     Procedures:     Antimicrobials:       Subjective: Yesterday her nausea and vomiting improved but is recurrent this am, no chest pain or dyspnea, her appetite is improving.   Objective: Vitals:   10/13/18 0339 10/13/18 0349 10/13/18 0614 10/13/18 1033  BP: 115/79  117/81 (!) 137/92  Pulse: (!) 121 91 (!) 113 (!) 128  Resp: 18  17 18   Temp: 98.6 F (37 C)  98.8 F (37.1 C) 98.2 F (36.8 C)  TempSrc: Oral  Oral Oral  SpO2: 98%  100% 100%  Weight:      Height:        Intake/Output Summary (Last 24 hours) at 10/13/2018 1118 Last data filed at 10/13/2018 0443 Gross per 24 hour  Intake 1040 ml  Output 725 ml  Net 315 ml   Filed Weights   10/06/18 1614  Weight: 61 kg    Examination:   General: Not in pain or dyspnea, deconditioned  Neurology: Awake and alert, non focal  E ENT: mild pallor, no icterus, oral mucosa moist Cardiovascular: No JVD. S1-S2 present, rhythmic, no gallops, rubs, or murmurs. No lower extremity  edema. Pulmonary: positive breath sounds bilaterally, adequate air movement, no wheezing, rhonchi or rales. Gastrointestinal. Abdomen with mild distention, no organomegaly, non tender, no rebound or guarding Skin. No rashes Musculoskeletal: no joint deformities     Data Reviewed: I have personally reviewed following labs and imaging studies  CBC: Recent Labs  Lab 10/06/18 1214 10/07/18 0505 10/08/18 0601 10/12/18 0520  WBC 10.0 8.2 8.8 12.2*  NEUTROABS 7.4  --   --   --   HGB 11.7* 12.1 13.0 11.8*  HCT 35.4* 36.9 40.7 35.9*  MCV 86.6 88.3 89.1 87.8  PLT 326 294 320 [id number]   Basic Metabolic Panel: Recent Labs  Lab 10/06/18 1214  10/08/18 0601 10/09/18 0421 10/10/18 1316 10/11/18 0526 10/12/18 0520 10/13/18 0530  NA 139   < > 137 136 136 137 135 137  K 3.8   < > 3.4* 4.0 3.4* 3.1* 3.3* 3.0*  CL 109   < > 102 107 106 107 105 104  CO2 22   < > 21* 15* 21* 23 20* 23  GLUCOSE 66*   < > 243* 264* 133* 76 315* 83  BUN 9   < > 6 9 9 10 12 15   CREATININE 0.52   < > 0.62 0.72 0.57 0.60 0.86 0.62  CALCIUM 8.5*   < > 8.6* 8.5* 8.7* 8.4* 8.7* 8.6*  MG 1.7  --  1.9  --   --   --   --   --   PHOS 2.1*  --   --   --   --   --   --   --    < > = values in this interval not displayed.   GFR: Estimated Creatinine Clearance: 86.6 mL/min (by C-G formula based on SCr of 0.62 mg/dL). Liver Function Tests: Recent Labs  Lab 10/06/18 1214 10/07/18 0505  AST  --  26  ALT  --  17  ALKPHOS  --  79  BILITOT  --  0.7  PROT  --  6.5  ALBUMIN 3.6 3.7   No results for input(s): LIPASE, AMYLASE in the last 168 hours. No results for input(s): AMMONIA in the last 168 hours. Coagulation Profile: No results for input(s): INR, PROTIME in the last 168 hours. Cardiac Enzymes: No results for input(s): CKTOTAL, CKMB, CKMBINDEX, TROPONINI in the last 168 hours. BNP (last 3 results) No results for input(s): PROBNP in the last 8760 hours. HbA1C: No results for input(s): HGBA1C in the last 72  hours. CBG: Recent Labs  Lab 10/12/18 1153 10/12/18 1611 10/13/18 0037 10/13/18 0338 10/13/18 0804  GLUCAP 99 127* 145* 92 56*   Lipid Profile: No results for input(s): CHOL, HDL, LDLCALC, TRIG, CHOLHDL, LDLDIRECT in the last 72 hours. Thyroid Function Tests: No results for input(s): TSH, T4TOTAL, FREET4, T3FREE, THYROIDAB in the last 72 hours. Anemia Panel: No results for input(s): VITAMINB12, FOLATE, FERRITIN, TIBC, IRON, RETICCTPCT in the last 72 hours.    Radiology Studies: I have reviewed all of the imaging during this hospital visit personally     Scheduled Meds: . Chlorhexidine Gluconate Cloth  6 each Topical Daily  .  docusate sodium  100 mg Oral BID  . enoxaparin (LOVENOX) injection  40 mg Subcutaneous Q24H  . insulin pump   Subcutaneous TID AC, HS, 0200  . metoCLOPramide  10 mg Oral TID AC  . pantoprazole  40 mg Oral Daily  . scopolamine  1 patch Transdermal Q72H  . sodium chloride flush  10-40 mL Intracatheter Q12H  . sodium chloride flush  10-40 mL Intracatheter Q12H   Continuous Infusions: . sodium chloride 75 mL/hr at 10/13/18 0540     LOS: 7 days        Rutilio Yellowhair Gerome Apley, MD

## 2018-10-13 NOTE — Progress Notes (Addendum)
At shift change patient was 10/10 pain in abdomen and laying across the bed. The off-going nurse reported that she may need to be bladder scanned due to low output in foley bag - I had NT bladder scan patient and saw she add greater than 550 in bladder. Due to the catheter touching the bed / gown etc, I could not manipulate it to see whether it was in far enough or not so I removed the foley. Once foley was removed, patient was able to void 400 and had instant pain relief.   I informed patient that I will leave the foley out for now to see if she can void on her own - if not, I will have to reinsert a new foley.  Patient also has a yellow mews score - will follow proper procedure - I feel as if muse was triggered due to the severity of her pain at shift change - no acute changes

## 2018-10-14 DIAGNOSIS — E876 Hypokalemia: Secondary | ICD-10-CM

## 2018-10-14 LAB — CBC WITH DIFFERENTIAL/PLATELET
Abs Immature Granulocytes: 0.02 10*3/uL (ref 0.00–0.07)
Basophils Absolute: 0 10*3/uL (ref 0.0–0.1)
Basophils Relative: 0 %
Eosinophils Absolute: 0.2 10*3/uL (ref 0.0–0.5)
Eosinophils Relative: 2 %
HCT: 34.2 % — ABNORMAL LOW (ref 36.0–46.0)
Hemoglobin: 11 g/dL — ABNORMAL LOW (ref 12.0–15.0)
Immature Granulocytes: 0 %
Lymphocytes Relative: 14 %
Lymphs Abs: 1.4 10*3/uL (ref 0.7–4.0)
MCH: 28.6 pg (ref 26.0–34.0)
MCHC: 32.2 g/dL (ref 30.0–36.0)
MCV: 88.8 fL (ref 80.0–100.0)
Monocytes Absolute: 1 10*3/uL (ref 0.1–1.0)
Monocytes Relative: 10 %
Neutro Abs: 7.4 10*3/uL (ref 1.7–7.7)
Neutrophils Relative %: 74 %
Platelets: 324 10*3/uL (ref 150–400)
RBC: 3.85 MIL/uL — ABNORMAL LOW (ref 3.87–5.11)
RDW: 13.5 % (ref 11.5–15.5)
WBC: 10.1 10*3/uL (ref 4.0–10.5)
nRBC: 0 % (ref 0.0–0.2)

## 2018-10-14 LAB — URINALYSIS, ROUTINE W REFLEX MICROSCOPIC
Bacteria, UA: NONE SEEN
Bilirubin Urine: NEGATIVE
Glucose, UA: >=500 mg/dL — AB
Ketones, ur: 80 mg/dL — AB
Nitrite: NEGATIVE
Protein, ur: >=300 mg/dL — AB
RBC / HPF: >50 RBC/hpf — ABNORMAL HIGH (ref 0–5)
Specific Gravity, Urine: 1.016 (ref 1.005–1.030)
pH: 6 (ref 5.0–8.0)

## 2018-10-14 LAB — BASIC METABOLIC PANEL
Anion gap: 12 (ref 5–15)
BUN: 14 mg/dL (ref 6–20)
CO2: 22 mmol/L (ref 22–32)
Calcium: 8.4 mg/dL — ABNORMAL LOW (ref 8.9–10.3)
Chloride: 105 mmol/L (ref 98–111)
Creatinine, Ser: 0.64 mg/dL (ref 0.44–1.00)
GFR calc Af Amer: >60 mL/min (ref >60)
GFR calc non Af Amer: >60 mL/min (ref >60)
Glucose, Bld: 156 mg/dL — ABNORMAL HIGH (ref 70–99)
Potassium: 2.9 mmol/L — ABNORMAL LOW (ref 3.5–5.1)
Sodium: 139 mmol/L (ref 135–145)

## 2018-10-14 LAB — GLUCOSE, CAPILLARY
Glucose-Capillary: 136 mg/dL — ABNORMAL HIGH (ref 70–99)
Glucose-Capillary: 180 mg/dL — ABNORMAL HIGH (ref 70–99)

## 2018-10-14 LAB — MAGNESIUM: Magnesium: 1.6 mg/dL — ABNORMAL LOW (ref 1.7–2.4)

## 2018-10-14 MED ORDER — MAGNESIUM SULFATE 2 GM/50ML IV SOLN
2.0000 g | Freq: Once | INTRAVENOUS | Status: AC
  Administered 2018-10-14: 12:00:00 2 g via INTRAVENOUS
  Filled 2018-10-14: qty 50

## 2018-10-14 MED ORDER — POTASSIUM CHLORIDE CRYS ER 20 MEQ PO TBCR
40.0000 meq | EXTENDED_RELEASE_TABLET | ORAL | Status: AC
  Administered 2018-10-14: 22:00:00 40 meq via ORAL
  Filled 2018-10-14: qty 2

## 2018-10-14 NOTE — Progress Notes (Signed)
PROGRESS NOTE    [name]  [id number] DOB: May 06, 1990 DOA: 10/05/2018 PCP: Vicenta Aly, FNP    Brief Narrative:  28 year old female who presented with nausea. She does have significant past medical history for type 1 diabetes mellitus and gastroparesis. Reported intractable nausea and vomiting for about 3 days. She had multiple visits to the emergency room without improvement. On her initial physical examination blood pressure 136/95, heart rate 103, respiratory rate 14, oxygen saturation 99%, her lungs are clear to auscultation bilaterally, heart S1-S2 present and rhythmic, abdomen soft and nontender, positive bowel sounds, no lower extremity edema. CT of the abdomen and pelvis with severe right hydroureteronephrosis, distended bladder with mild focal areas of bladder wall thickening. New since November 2019. Dilated right ureter extending to the bladder without obstructing cause identified. Wedge-shaped areas of decreased attenuation with right kidney of uncertain chronicity, possible pyelonephritis.  Patient was admitted to the hospitalwith theworking diagnosis of intractable nausea and vomiting likely due to gastroparesis complicated by possible right pyelonephritis with obstructive uropathy.  Patient completed 5 days of IV antibiotic therapy with ceftriaxone. Continue to have nausea and vomiting with very poor oral intake.   Persistent hypokalemia and hypomagnesemia.    Assessment & Plan:   Principal Problem:   Intractable nausea and vomiting Active Problems:   Uncontrolled type 1 diabetes mellitus (HCC)   Sinus tachycardia   Hypertension   Gastroparesis due to DM (HCC)   Hydronephrosis of right kidney   Acute pyelonephritis     1. Acute flare of diabetic gastroparesis. Patient still not yet back to baseline, persistent nausea and vomiting. Will increase from 5 to 10 mg tid metoclopramide. Continue antiacid therapy and soft diet. Out of bed as  tolerated.    2. T1DM./ uncontrolled/ hypoglycemia. Controlled glucose 99, 127, 145, 92, 56. Continue with insulin pump.   3. Urinary retention with right hydronephrosis. follow renal US with resolved hydronephrosis. Continue foley catheter at discharge per urology recommendations.  4. HTN. Blood pressure systolic 0000000 mmHg, will continue close monitoring.   5. Hypokalemia. K down to 2,9 and Mg at 1,6 with serum bicarbonate at 22. Will continue k correction with Kcl 40 meq tid and Mg sulfate 2 grams.   DVT prophylaxis:enoxaparin Code Status:full Family Communication:No family at the bedside Disposition Plan/ discharge barriers:pending clinical improvement.   Body mass index is 23.82 kg/m. Malnutrition Type:      Malnutrition Characteristics:      Nutrition Interventions:     RN Pressure Injury Documentation:     Consultants:     Procedures:     Antimicrobials:       Subjective: Patient continue to have nausea and vomiting, has improved in intensity but not back to baseline, continue to be very weak and deconditioned.   Objective: Vitals:   10/13/18 2128 10/14/18 0238 10/14/18 0652 10/14/18 0944  BP: 116/77 122/85 104/75 (!) 138/99  Pulse: (!) 118 (!) 118 (!) 110 (!) 118  Resp: 20 20 16 18   Temp: 99.2 F (37.3 C) 99.3 F (37.4 C) 99 F (37.2 C) 99.1 F (37.3 C)  TempSrc: Oral Oral Oral Oral  SpO2: 100% 100% 98% 100%  Weight:      Height:        Intake/Output Summary (Last 24 hours) at 10/14/2018 1115 Last data filed at 10/14/2018 0700 Gross per 24 hour  Intake 1957.56 ml  Output 1100 ml  Net 857.56 ml   Filed Weights   10/06/18 1614  Weight: 61 kg  Examination:   General: Not in pain or dyspnea, deconditioned  Neurology: Awake and alert, non focal  E ENT: mild pallor, no icterus, oral mucosa moist Cardiovascular: No JVD. S1-S2 present, rhythmic, no gallops, rubs, or murmurs. No lower extremity edema. Pulmonary:  positive breath sounds bilaterally, adequate air movement, no wheezing, rhonchi or rales. Gastrointestinal. Abdomen with mild distention with no organomegaly, non tender, no rebound or guarding Skin. No rashes Musculoskeletal: no joint deformities     Data Reviewed: I have personally reviewed following labs and imaging studies  CBC: Recent Labs  Lab 10/08/18 0601 10/12/18 0520 10/14/18 0545  WBC 8.8 12.2* 10.1  NEUTROABS  --   --  7.4  HGB 13.0 11.8* 11.0*  HCT 40.7 35.9* 34.2*  MCV 89.1 87.8 88.8  PLT 320 361 0000000   Basic Metabolic Panel: Recent Labs  Lab 10/08/18 0601  10/10/18 1316 10/11/18 0526 10/12/18 0520 10/13/18 0530 10/14/18 0545  NA 137   < > 136 137 135 137 139  K 3.4*   < > 3.4* 3.1* 3.3* 3.0* 2.9*  CL 102   < > 106 107 105 104 105  CO2 21*   < > 21* 23 20* 23 22  GLUCOSE 243*   < > 133* 76 315* 83 156*  BUN 6   < > 9 10 12 15 14   CREATININE 0.62   < > 0.57 0.60 0.86 0.62 0.64  CALCIUM 8.6*   < > 8.7* 8.4* 8.7* 8.6* 8.4*  MG 1.9  --   --   --   --   --  1.6*   < > = values in this interval not displayed.   GFR: Estimated Creatinine Clearance: 86.6 mL/min (by C-G formula based on SCr of 0.64 mg/dL). Liver Function Tests: No results for input(s): AST, ALT, ALKPHOS, BILITOT, PROT, ALBUMIN in the last 168 hours. No results for input(s): LIPASE, AMYLASE in the last 168 hours. No results for input(s): AMMONIA in the last 168 hours. Coagulation Profile: No results for input(s): INR, PROTIME in the last 168 hours. Cardiac Enzymes: No results for input(s): CKTOTAL, CKMB, CKMBINDEX, TROPONINI in the last 168 hours. BNP (last 3 results) No results for input(s): PROBNP in the last 8760 hours. HbA1C: No results for input(s): HGBA1C in the last 72 hours. CBG: Recent Labs  Lab 10/13/18 1309 10/13/18 1757 10/13/18 2124 10/14/18 0003 10/14/18 0450  GLUCAP 252* 168* 109* 180* 136*   Lipid Profile: No results for input(s): CHOL, HDL, LDLCALC, TRIG,  CHOLHDL, LDLDIRECT in the last 72 hours. Thyroid Function Tests: No results for input(s): TSH, T4TOTAL, FREET4, T3FREE, THYROIDAB in the last 72 hours. Anemia Panel: No results for input(s): VITAMINB12, FOLATE, FERRITIN, TIBC, IRON, RETICCTPCT in the last 72 hours.    Radiology Studies: I have reviewed all of the imaging during this hospital visit personally     Scheduled Meds: . Chlorhexidine Gluconate Cloth  6 each Topical Daily  . docusate sodium  100 mg Oral BID  . enoxaparin (LOVENOX) injection  40 mg Subcutaneous Q24H  . insulin pump   Subcutaneous TID AC, HS, 0200  . metoCLOPramide  10 mg Oral TID AC  . pantoprazole  40 mg Oral Daily  . potassium chloride  40 mEq Oral Q4H  . scopolamine  1 patch Transdermal Q72H  . sodium chloride flush  10-40 mL Intracatheter Q12H  . sodium chloride flush  10-40 mL Intracatheter Q12H   Continuous Infusions: . sodium chloride 75 mL/hr at 10/14/18 0523  .  magnesium sulfate bolus IVPB       LOS: 8 days         Gerome Apley, MD

## 2018-10-14 NOTE — Progress Notes (Signed)
Pt's urine reddish/brown with sediment. Paged floor coverage.

## 2018-10-15 LAB — MAGNESIUM: Magnesium: 2 mg/dL (ref 1.7–2.4)

## 2018-10-15 LAB — GLUCOSE, CAPILLARY
Glucose-Capillary: 109 mg/dL — ABNORMAL HIGH (ref 70–99)
Glucose-Capillary: 132 mg/dL — ABNORMAL HIGH (ref 70–99)
Glucose-Capillary: 81 mg/dL (ref 70–99)
Glucose-Capillary: 278 mg/dL — ABNORMAL HIGH (ref 70–99)
Glucose-Capillary: 204 mg/dL — ABNORMAL HIGH (ref 70–99)
Glucose-Capillary: 132 mg/dL — ABNORMAL HIGH (ref 70–99)
Glucose-Capillary: 162 mg/dL — ABNORMAL HIGH (ref 70–99)
Glucose-Capillary: 282 mg/dL — ABNORMAL HIGH (ref 70–99)
Glucose-Capillary: 355 mg/dL — ABNORMAL HIGH (ref 70–99)

## 2018-10-15 LAB — BASIC METABOLIC PANEL
Anion gap: 6 (ref 5–15)
BUN: 11 mg/dL (ref 6–20)
CO2: 28 mmol/L (ref 22–32)
Calcium: 8.5 mg/dL — ABNORMAL LOW (ref 8.9–10.3)
Chloride: 104 mmol/L (ref 98–111)
Creatinine, Ser: 0.5 mg/dL (ref 0.44–1.00)
GFR calc Af Amer: >60 mL/min (ref >60)
GFR calc non Af Amer: >60 mL/min (ref >60)
Glucose, Bld: 144 mg/dL — ABNORMAL HIGH (ref 70–99)
Potassium: 2.8 mmol/L — ABNORMAL LOW (ref 3.5–5.1)
Sodium: 138 mmol/L (ref 135–145)

## 2018-10-15 MED ORDER — POTASSIUM CHLORIDE CRYS ER 20 MEQ PO TBCR
40.0000 meq | EXTENDED_RELEASE_TABLET | ORAL | Status: AC
  Administered 2018-10-15 (×3): 40 meq via ORAL
  Filled 2018-10-15 (×3): qty 2

## 2018-10-15 NOTE — Progress Notes (Addendum)
PROGRESS NOTE    [name]  [id number] DOB: 12-05-1990 DOA: 10/05/2018 PCP: Vicenta Aly, FNP    Brief Narrative:  28 year old female who presented with nausea. She does have significant past medical history for type 1 diabetes mellitus and gastroparesis. Reported intractable nausea and vomiting for about 3 days. She had multiple visits to the emergency room without improvement. On her initial physical examination blood pressure 136/95, heart rate 103, respiratory rate 14, oxygen saturation 99%, her lungs are clear to auscultation bilaterally, heart S1-S2 present and rhythmic, abdomen soft and nontender, positive bowel sounds, no lower extremity edema. CT of the abdomen and pelvis with severe right hydroureteronephrosis, distended bladder with mild focal areas of bladder wall thickening. New since November 2019. Dilated right ureter extending to the bladder without obstructing cause identified. Wedge-shaped areas of decreased attenuation with right kidney of uncertain chronicity, possible pyelonephritis.  Patient was admitted to the hospitalwith theworking diagnosis of intractable nausea and vomiting likely due to gastroparesis complicated by possible right pyelonephritis with obstructive uropathy.  Patient completed 5 days of IV antibiotic therapy with ceftriaxone. Continue to have nausea and vomiting with very poor oral intake.  Persistent hypokalemia and hypomagnesemia, related to GI losses.     Assessment & Plan:   Principal Problem:   Intractable nausea and vomiting Active Problems:   Uncontrolled type 1 diabetes mellitus (HCC)   Sinus tachycardia   Hypertension   Gastroparesis due to DM (HCC)   Hydronephrosis of right kidney   Acute pyelonephritis    1. Acute flare of diabetic gastroparesis. Her nausea and vomiting are improved today, will continue with soft diet and 10 mg tid metoclopramide with meals. Continue pantoprazole for acid suppression.    2. T1DM./ uncontrolled/ hypoglycemia.Continue insulin pump for glucose cover and monitoring, her glucose has been well controlled.  3. Urinary retention with right hydronephrosis. follow renal US with resolved hydronephrosis.Will dc foley catheter for now and continue to monitor bladder scan.   4. HTN. Blood pressure has been well controlled.    5. Hypokalemia. Persistent hypokalemia, today down to 2.8, serum cr 0.50, Mg at 2,0. Will continue K correction with Kcl 40 meq tid and follow with renal panel in am.   DVT prophylaxis:enoxaparin Code Status:full Family Communication:No family at the bedside Disposition Plan/ discharge barriers:pending clinical improvement.      Body mass index is 23.82 kg/m. Malnutrition Type:      Malnutrition Characteristics:      Nutrition Interventions:     RN Pressure Injury Documentation:     Consultants:     Procedures:     Antimicrobials:       Subjective: Nausea and vomiting have been improving, tolerating better po, no dyspnea, no chest pain. Has been out of bed. Continue to have low K.   Objective: Vitals:   10/14/18 1338 10/14/18 1717 10/14/18 2024 10/15/18 0634  BP: 109/73 122/83 115/69 109/72  Pulse: (!) 107 (!) 121 (!) 104 92  Resp: 18 20 16 16   Temp: 99.5 F (37.5 C) 99.4 F (37.4 C) 99.2 F (37.3 C) 98.9 F (37.2 C)  TempSrc: Oral Oral Oral Oral  SpO2: 99% 93% 100% 100%  Weight:      Height:        Intake/Output Summary (Last 24 hours) at 10/15/2018 1318 Last data filed at 10/15/2018 0900 Gross per 24 hour  Intake 960 ml  Output 1150 ml  Net -190 ml   Filed Weights   10/06/18 1614  Weight: 61 kg  Examination:   General: Not in pain or dyspnea, deconditioned  Neurology: Awake and alert, non focal  E ENT: no pallor, no icterus, oral mucosa moist Cardiovascular: No JVD. S1-S2 present, rhythmic, no gallops, rubs, or murmurs. No lower extremity edema. Pulmonary:  vesicular breath sounds bilaterally, adequate air movement, no wheezing, rhonchi or rales. Gastrointestinal. Abdomen with no organomegaly, non tender, no rebound or guarding Skin. No rashes Musculoskeletal: no joint deformities     Data Reviewed: I have personally reviewed following labs and imaging studies  CBC: Recent Labs  Lab 10/12/18 0520 10/14/18 0545  WBC 12.2* 10.1  NEUTROABS  --  7.4  HGB 11.8* 11.0*  HCT 35.9* 34.2*  MCV 87.8 88.8  PLT 361 0000000   Basic Metabolic Panel: Recent Labs  Lab 10/11/18 0526 10/12/18 0520 10/13/18 0530 10/14/18 0545 10/15/18 0556  NA 137 135 137 139 138  K 3.1* 3.3* 3.0* 2.9* 2.8*  CL 107 105 104 105 104  CO2 23 20* 23 22 28   GLUCOSE 76 315* 83 156* 144*  BUN 10 12 15 14 11   CREATININE 0.60 0.86 0.62 0.64 0.50  CALCIUM 8.4* 8.7* 8.6* 8.4* 8.5*  MG  --   --   --  1.6* 2.0   GFR: Estimated Creatinine Clearance: 86.6 mL/min (by C-G formula based on SCr of 0.5 mg/dL). Liver Function Tests: No results for input(s): AST, ALT, ALKPHOS, BILITOT, PROT, ALBUMIN in the last 168 hours. No results for input(s): LIPASE, AMYLASE in the last 168 hours. No results for input(s): AMMONIA in the last 168 hours. Coagulation Profile: No results for input(s): INR, PROTIME in the last 168 hours. Cardiac Enzymes: No results for input(s): CKTOTAL, CKMB, CKMBINDEX, TROPONINI in the last 168 hours. BNP (last 3 results) No results for input(s): PROBNP in the last 8760 hours. HbA1C: No results for input(s): HGBA1C in the last 72 hours. CBG: Recent Labs  Lab 10/14/18 1556 10/14/18 2038 10/15/18 0220 10/15/18 0718 10/15/18 1144  GLUCAP 81 278* 204* 132* 162*   Lipid Profile: No results for input(s): CHOL, HDL, LDLCALC, TRIG, CHOLHDL, LDLDIRECT in the last 72 hours. Thyroid Function Tests: No results for input(s): TSH, T4TOTAL, FREET4, T3FREE, THYROIDAB in the last 72 hours. Anemia Panel: No results for input(s): VITAMINB12, FOLATE, FERRITIN,  TIBC, IRON, RETICCTPCT in the last 72 hours.    Radiology Studies: I have reviewed all of the imaging during this hospital visit personally     Scheduled Meds: . Chlorhexidine Gluconate Cloth  6 each Topical Daily  . docusate sodium  100 mg Oral BID  . enoxaparin (LOVENOX) injection  40 mg Subcutaneous Q24H  . insulin pump   Subcutaneous TID AC, HS, 0200  . metoCLOPramide  10 mg Oral TID AC  . pantoprazole  40 mg Oral Daily  . potassium chloride  40 mEq Oral Q4H  . scopolamine  1 patch Transdermal Q72H  . sodium chloride flush  10-40 mL Intracatheter Q12H  . sodium chloride flush  10-40 mL Intracatheter Q12H   Continuous Infusions:   LOS: 9 days        Mauricio Gerome Apley, MD

## 2018-10-16 LAB — BASIC METABOLIC PANEL
Anion gap: 6 (ref 5–15)
BUN: 9 mg/dL (ref 6–20)
CO2: 29 mmol/L (ref 22–32)
Calcium: 8.5 mg/dL — ABNORMAL LOW (ref 8.9–10.3)
Chloride: 105 mmol/L (ref 98–111)
Creatinine, Ser: 0.55 mg/dL (ref 0.44–1.00)
GFR calc Af Amer: >60 mL/min (ref >60)
GFR calc non Af Amer: >60 mL/min (ref >60)
Glucose, Bld: 151 mg/dL — ABNORMAL HIGH (ref 70–99)
Potassium: 3.5 mmol/L (ref 3.5–5.1)
Sodium: 140 mmol/L (ref 135–145)

## 2018-10-16 LAB — URINE CULTURE: Culture: 30000 — AB

## 2018-10-16 LAB — GLUCOSE, CAPILLARY
Glucose-Capillary: 149 mg/dL — ABNORMAL HIGH (ref 70–99)
Glucose-Capillary: 159 mg/dL — ABNORMAL HIGH (ref 70–99)
Glucose-Capillary: 180 mg/dL — ABNORMAL HIGH (ref 70–99)
Glucose-Capillary: 231 mg/dL — ABNORMAL HIGH (ref 70–99)

## 2018-10-16 MED ORDER — PANTOPRAZOLE SODIUM 40 MG PO TBEC: 40.0000 mg | DELAYED_RELEASE_TABLET | Freq: Every day | ORAL | 0 refills | Status: DC

## 2018-10-16 MED ORDER — ONDANSETRON 4 MG PO TBDP: 4.0000 mg | ORAL_TABLET | Freq: Three times a day (TID) | ORAL | 0 refills | Status: DC | PRN

## 2018-10-16 MED ORDER — PROMETHAZINE HCL 25 MG RE SUPP: 25.0000 mg | Freq: Four times a day (QID) | RECTAL | 0 refills | Status: DC | PRN

## 2018-10-16 NOTE — Discharge Summary (Signed)
Physician Discharge Summary  [name] MVH:[id number] DOB: 1990/10/23 DOA: 10/05/2018  PCP: Vicenta Aly, FNP  Admit date: 10/05/2018 Discharge date: 10/16/2018  Admitted From: Home  Disposition:  Home   Recommendations for Outpatient Follow-up and new medication changes:  1. Follow up with Vicenta Aly FNP in 7 days.  2. Continue antiemetics and antiacid therapy.  3. Continue glucose control with insulin pump.  4. Follow up with urology in 7 days.   Home Health: no   Equipment/Devices: no    Discharge Condition: stable  CODE STATUS: full  Diet recommendation: diabetic prudent.   Brief/Interim Summary: 28 year old female who presented with nausea. She does have significant past medical history for type 1 diabetes mellitus and gastroparesis. Reported intractable nausea and vomiting for about 3 days. She had multiple visits to the emergency room without improvement. On her initial physical examination blood pressure 136/95, heart rate 103, respiratory rate 14, oxygen saturation 99%, her lungs were clear to auscultation bilaterally, heart S1-S2 present and rhythmic, abdomen soft and nontender, positive bowel sounds, no lower extremity edema.   \Sodium 140, potassium 3.6, chloride 104, bicarb 21, glucose 93, BUN 14, creatinine 0.63, lipase 13, AST 29, ALT 19, white count 15.3, hemoglobin 13.3, hematocrit 40.9, platelets 337.  COVID-19 was negative.  Urinalysis more than 500 glucose, specific gravity 1.014, 21-50 red cells, 21-50 white cells.  CT of the abdomen and pelvis with severe right hydroureteronephrosis, distended bladder with mild focal areas of bladder wall thickening. New since November 2019. Dilated right ureter extending to the bladder without obstructing cause identified. Wedge-shaped areas of decreased attenuation with right kidney of uncertain chronicity, possible pyelonephritis.  EKG 102 bpm, normal axis, normal intervals, sinus rhythm, J-point elevation  V4 to V6, no T wave changes.  Patient was admitted to the hospitalwith theworking diagnosis of intractable nausea and vomiting likely due to gastroparesis complicated by possible right pyelonephritis with obstructive uropathy.  Patient completed 5 days of IV antibiotic therapy with ceftriaxone. Continue to have nausea and vomiting with very poor oral intake.  Persistent hypokalemia and hypomagnesemia, related to GI losses. Her symptoms have slowly improved. After foley catheter placement, resolved hydronephrosis per [place].   1.  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  Patient was admitted to the medical ward, patient was placed on IV antibiotic therapy with ceftriaxone.  Urine cultures from September 17 were no growth, 09.25th had 30,000 colony-forming units of yeast.  After Foley catheter was placed ultrasonography showed normal bilateral kidneys with no hydronephrosis.  Patient was seen by urology September 15, recommendations to do a voiding trial within 7 days.  Foley catheter has been removed on September 26 with no further urinary retention.  Will need outpatient follow-up with urology for diabetic cystopathy.   2.  Acute flare of diabetic gastroparesis.  Patient had a prolonged hospital course, slow to response to medical therapy.  Patient was placed on intravenous fluids, intravenous antiemetics and antiacids.  By the time of discharge she was able to tolerate soft diet with no major complications.  Will recommend to continue metoclopramide with meals.  3.  Type 1 diabetes mellitus, uncontrolled, hypoglycemia.  Patient had very poor oral intake while hospitalized, it slowly has improved after nausea and vomiting resolved.  Patient was continued on insulin pump for glucose control. Resume low dose lisinopril for proteinuria.   4.  Severe hypokalemia and hypomagnesemia.  Likely related to GI losses, patient received potassium  chloride and magnesium sulfate with good outcomes.  Her discharge potassium is 3.5, magnesium 2.0, the rest of her chemistry includes sodium 140, chloride 105, bicarb 29, BUN 9 and creatinine 0.55.   Discharge Diagnoses:  Principal Problem:   Intractable nausea and vomiting Active Problems:   Uncontrolled type 1 diabetes mellitus (HCC)   Sinus tachycardia   Hypertension   Gastroparesis due to DM ([place])   Hydronephrosis of right kidney   Acute pyelonephritis    Discharge Instructions   Allergies as of 10/16/2018      Reactions   Peanut-containing Drug Products Swelling, Other (See Comments)   Reaction:  Swelling of mouth and lips    Food Swelling, Other (See Comments)   Pt is allergic to strawberries.   Reaction:  Swelling of mouth and lips    Ultram [tramadol] Itching      Medication List    STOP taking these medications   scopolamine 1 MG/3DAYS Commonly known as: TRANSDERM-SCOP     TAKE these medications   albuterol 108 (90 Base) MCG/ACT inhaler Commonly known as: VENTOLIN HFA Inhale 1-2 puffs into the lungs every 6 (six) hours as needed for wheezing or shortness of breath.   blood glucose meter kit and supplies Dispense based on patient and insurance preference. Use up to four times daily as directed. (FOR ICD-10 E10.9, E11.9).   glucagon 1 MG injection Commonly known as: Glucagon Emergency Inject 1 mg into the muscle once as needed (For very low blood sugars. May repeat in 15-20 minutes if needed.).   Insulin Pen Needle 31G X 5 MM Misc 30 Units by Does not apply route at bedtime.   insulin pump Soln Inject 1 each into the skin continuous. Novolog insulin. Pt puts in her insulin pump: Medtronic 630G. Carb ratio 1-10, insulin sensitive 66. 1.10 from 12 to 12. Every 3 days needs to be replaced.   lisinopril 2.5 MG tablet Commonly known as: ZESTRIL Take 2.5 mg by mouth daily.   metoCLOPramide 10 MG tablet Commonly known as: REGLAN Take 1 tablet (10 mg total) by  mouth 3 (three) times daily with meals. What changed:   when to take this  reasons to take this   ondansetron 4 MG disintegrating tablet Commonly known as: Zofran ODT Take 1 tablet (4 mg total) by mouth every 8 (eight) hours as needed for nausea or vomiting.   pantoprazole 40 MG tablet Commonly known as: PROTONIX Take 1 tablet (40 mg total) by mouth daily for 7 days.   promethazine 25 MG suppository Commonly known as: PHENERGAN Place 1 suppository (25 mg total) rectally every 6 (six) hours as needed for nausea or vomiting.   trimethoprim 100 MG tablet Commonly known as: TRIMPEX Take 100 mg by mouth daily. UTI prophylaxis.       Allergies  Allergen Reactions  . Peanut-Containing Drug Products Swelling and Other (See Comments)    Reaction:  Swelling of mouth and lips   . 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   Reaction:  Swelling of mouth and lips   . Ultram [Tramadol] Itching    Consultations:  Urology    Procedures/Studies: Ct Abdomen Pelvis W Contrast  Result Date: 10/04/2018 CLINICAL DATA:  28 year old female with acute abdominal and pelvic pain for 2 days. EXAM: CT ABDOMEN AND PELVIS WITH CONTRAST TECHNIQUE: Multidetector CT imaging of the abdomen and pelvis was performed using the standard protocol following bolus administration of intravenous contrast. CONTRAST:  147m OMNIPAQUE IOHEXOL 300 MG/ML  SOLN COMPARISON:  11/26/2017 CT and prior studies FINDINGS: Lower chest: No acute abnormality. Hepatobiliary: Very subtle hypodense lesions within both the RIGHT and LEFT lobes are unchanged from 10/23/2016. No new hepatic abnormalities are present. No biliary dilatation. The patient is status post cholecystectomy. Pancreas: Unremarkable Spleen: Unremarkable Adrenals/Urinary Tract: New severe RIGHT hydroureteronephrosis to the bladder is noted without definite obstructing cause identified. Wedge-shaped areas of decreased RIGHT renal  enhancement noted and of uncertain chronicity but may represent infection/pyelonephritis. The bladder is distended with mild focal areas of bladder wall thickening. The LEFT kidney and adrenal glands are unremarkable. Stomach/Bowel: Stomach is within normal limits. No evidence of bowel wall thickening, distention, or inflammatory changes. Vascular/Lymphatic: No significant vascular findings are present. No enlarged abdominal or pelvic lymph nodes. Reproductive: Uterus and bilateral adnexa are unremarkable. Other: No ascites, focal collection or pneumoperitoneum. Musculoskeletal: No acute or suspicious bony abnormalities are identified. IMPRESSION: 1. Severe RIGHT hydroureteronephrosis to a distended bladder with mild focal areas of bladder wall thickening, of uncertain chronicity but new since 11/26/2017. Dilated RIGHT ureter extends to the bladder without obstructing cause identified. Urology consultation is recommended. 2. Wedge shaped areas of decreased attenuation within the RIGHT kidney of uncertain chronicity but pyelonephritis is not excluded. No evidence of renal abscess. Electronically Signed   By: Margarette Canada M.D.   On: 10/04/2018 16:26   US Renal  Result Date: 10/06/2018 CLINICAL DATA:  Assess for hydronephrosis EXAM: RENAL / URINARY TRACT ULTRASOUND COMPLETE COMPARISON:  None. FINDINGS: Right Kidney: Renal measurements: 10.3 x 5 x 5.3 cm = volume: 144 mL . Echogenicity within normal limits. No mass or hydronephrosis visualized. Left Kidney: Renal measurements: 10.4 x 4.6 x 4.5 cm = volume: 112 mL. Echogenicity within normal limits. No mass or hydronephrosis visualized. Bladder: Evaluation of the bladder is limited.  The bladder is empty. IMPRESSION: Normal bilateral kidneys.  No hydronephrosis bilaterally. Electronically Signed   By: Abelardo Diesel M.D.   On: 10/06/2018 12:25   Dg Abd Portable 1v  Result Date: 10/11/2018 CLINICAL DATA:  Nausea, vomiting EXAM: PORTABLE ABDOMEN - 1 VIEW COMPARISON:   11/17/2017 FINDINGS: Prior cholecystectomy. There is a non obstructive bowel gas pattern. No supine evidence of free air. No organomegaly or suspicious calcification. No acute bony abnormality. IMPRESSION: Negative. Electronically Signed   By: Rolm Baptise M.D.   On: 10/11/2018 19:55   [place] Ekg Site Rite  Result Date: 10/12/2018 If Site Rite image not attached, placement could not be confirmed due to current cardiac rhythm.     Procedures:   Subjective: Patient is feeling well, no further nausea or vomiting, tolerating soft diet. No chest pain or dyspnea, no urinary retention.   Discharge Exam: Vitals:   10/15/18 2015 10/16/18 0431  BP: 110/70 119/87  Pulse: (!) 108 99  Resp: 16 18  Temp: 98.9 F (37.2 C) 98.7 F (37.1 C)  SpO2: 100% 100%   Vitals:   10/15/18 0634 10/15/18 1338 10/15/18 2015 10/16/18 0431  BP: 109/72 110/72 110/70 119/87  Pulse: 92 (!) 102 (!) 108 99  Resp: 16 16 16 18   Temp: 98.9 F (37.2 C) 98.6 F (37 C) 98.9 F (37.2 C) 98.7 F (37.1 C)  TempSrc: Oral Oral Oral Oral  SpO2: 100% 100% 100% 100%  Weight:      Height:        General: Not in pain or dyspnea  Neurology: Awake and alert, non focal  E ENT: no pallor, no icterus, oral mucosa moist Cardiovascular: No JVD. S1-S2 present,  rhythmic, no gallops, rubs, or murmurs. No lower extremity edema. Pulmonary: positive breath sounds bilaterally, adequate air movement, no wheezing, rhonchi or rales. Gastrointestinal. Abdomen with no organomegaly, non tender, no rebound or guarding Skin. No rashes Musculoskeletal: no joint deformities   The results of significant diagnostics from this hospitalization (including imaging, microbiology, ancillary and laboratory) are listed below for reference.     Microbiology: Recent Results (from the past 240 hour(s))  Culture, Urine     Status: Abnormal (Preliminary result)   Collection Time: 10/14/18  6:46 PM   Specimen: Urine, Clean Catch  Result Value Ref Range  Status   Specimen Description   Final    URINE, CLEAN CATCH Performed at [facility], [place] [address] [place], [place] [address]    Special Requests   Final    NONE Performed at [facility], [place] [address], [place], [place] [address]    Culture (A)  Final    30,000 COLONIES/mL YEAST CULTURE REINCUBATED FOR BETTER GROWTH Performed at [facility], [place] [address] [place], [place] [address]    Report Status PENDING  Incomplete     Labs: BNP (last 3 results) No results for input(s): BNP in the last 8760 hours. Basic Metabolic Panel: Recent Labs  Lab 10/12/18 0520 10/13/18 0530 10/14/18 0545 10/15/18 0556 10/16/18 0420  NA 135 137 139 138 140  K 3.3* 3.0* 2.9* 2.8* 3.5  CL 105 104 105 104 105  CO2 20* 23 22 28 29   GLUCOSE 315* 83 156* 144* 151*  BUN 12 15 14 11 9   CREATININE 0.86 0.62 0.64 0.50 0.55  CALCIUM 8.7* 8.6* 8.4* 8.5* 8.5*  MG  --   --  1.6* 2.0  --    Liver Function Tests: No results for input(s): AST, ALT, ALKPHOS, BILITOT, PROT, ALBUMIN in the last 168 hours. No results for input(s): LIPASE, AMYLASE in the last 168 hours. No results for input(s): AMMONIA in the last 168 hours. CBC: Recent Labs  Lab 10/12/18 0520 10/14/18 0545  WBC 12.2* 10.1  NEUTROABS  --  7.4  HGB 11.8* 11.0*  HCT 35.9* 34.2*  MCV 87.8 88.8  PLT 361 324   Cardiac Enzymes: No results for input(s): CKTOTAL, CKMB, CKMBINDEX, TROPONINI in the last 168 hours. BNP: Invalid input(s): POCBNP CBG: Recent Labs  Lab 10/15/18 1621 10/15/18 2008 10/16/18 0035 10/16/18 0428 10/16/18 0716  GLUCAP 282* 355* 159* 149* 180*   D-Dimer No results for input(s): DDIMER in the last 72 hours. Hgb A1c No results for input(s): HGBA1C in the last 72 hours. Lipid Profile No results for input(s): CHOL, HDL, LDLCALC, TRIG, CHOLHDL, LDLDIRECT in the last 72 hours. Thyroid function studies No results for input(s): TSH, T4TOTAL, T3FREE, THYROIDAB in  the last 72 hours.  Invalid input(s): FREET3 Anemia work up No results for input(s): VITAMINB12, FOLATE, FERRITIN, TIBC, IRON, RETICCTPCT in the last 72 hours. Urinalysis    Component Value Date/Time   COLORURINE RED (A) 10/14/2018 0456   APPEARANCEUR TURBID (A) 10/14/2018 0456   LABSPEC 1.016 10/14/2018 0456   PHURINE 6.0 10/14/2018 0456   GLUCOSEU >=500 (A) 10/14/2018 0456   GLUCOSEU >=1000 11/07/2012 1205   HGBUR LARGE (A) 10/14/2018 0456   BILIRUBINUR NEGATIVE 10/14/2018 0456   KETONESUR 80 (A) 10/14/2018 0456   PROTEINUR >=300 (A) 10/14/2018 0456   UROBILINOGEN 0.2 11/24/2014 1045   NITRITE NEGATIVE 10/14/2018 0456   LEUKOCYTESUR MODERATE (A) 10/14/2018 0456   Sepsis Labs Invalid input(s): PROCALCITONIN,  WBC,  LACTICIDVEN Microbiology Recent Results (from the past 240 hour(s))  Culture, Urine     Status: Abnormal (Preliminary result)   Collection Time: 10/14/18  6:46 PM   Specimen: Urine, Clean Catch  Result Value Ref Range Status   Specimen Description   Final    URINE, CLEAN CATCH Performed at [facility], [place] [address] [place], [place] [address]    Special Requests   Final    NONE Performed at [facility], [place] [address], [place], [place] [address]    Culture (A)  Final    30,000 COLONIES/mL YEAST CULTURE REINCUBATED FOR BETTER GROWTH Performed at [facility], [place] [address] [place], [place] [address]    Report Status PENDING  Incomplete     Time coordinating discharge: 45 minutes  SIGNED:   Tawni Millers, MD  [facility] 10/16/2018, 9:48 AM

## 2018-11-04 ENCOUNTER — Encounter (HOSPITAL_COMMUNITY): Payer: Self-pay | Admitting: *Deleted

## 2018-11-04 ENCOUNTER — Observation Stay (HOSPITAL_COMMUNITY): Payer: Medicaid Other

## 2018-11-04 ENCOUNTER — Inpatient Hospital Stay (HOSPITAL_COMMUNITY)
Admission: EM | Admit: 2018-11-04 | Discharge: 2018-11-11 | DRG: 638 | Disposition: A | Discharge status: Home / Self Care | Payer: Medicaid Other | Attending: Internal Medicine | Admitting: Internal Medicine

## 2018-11-04 DIAGNOSIS — E10649 Type 1 diabetes mellitus with hypoglycemia without coma: Secondary | ICD-10-CM | POA: Diagnosis not present

## 2018-11-04 DIAGNOSIS — K3184 Gastroparesis: Secondary | ICD-10-CM | POA: Diagnosis present

## 2018-11-04 DIAGNOSIS — F419 Anxiety disorder, unspecified: Secondary | ICD-10-CM | POA: Diagnosis present

## 2018-11-04 DIAGNOSIS — Z91010 Allergy to peanuts: Secondary | ICD-10-CM

## 2018-11-04 DIAGNOSIS — E1010 Type 1 diabetes mellitus with ketoacidosis without coma: Principal | ICD-10-CM | POA: Diagnosis present

## 2018-11-04 DIAGNOSIS — B3749 Other urogenital candidiasis: Secondary | ICD-10-CM | POA: Diagnosis present

## 2018-11-04 DIAGNOSIS — R112 Nausea with vomiting, unspecified: Secondary | ICD-10-CM | POA: Diagnosis not present

## 2018-11-04 DIAGNOSIS — E1143 Type 2 diabetes mellitus with diabetic autonomic (poly)neuropathy: Secondary | ICD-10-CM | POA: Diagnosis present

## 2018-11-04 DIAGNOSIS — Z833 Family history of diabetes mellitus: Secondary | ICD-10-CM

## 2018-11-04 DIAGNOSIS — E1043 Type 1 diabetes mellitus with diabetic autonomic (poly)neuropathy: Secondary | ICD-10-CM | POA: Diagnosis present

## 2018-11-04 DIAGNOSIS — I959 Hypotension, unspecified: Secondary | ICD-10-CM | POA: Diagnosis not present

## 2018-11-04 DIAGNOSIS — R011 Cardiac murmur, unspecified: Secondary | ICD-10-CM | POA: Diagnosis present

## 2018-11-04 DIAGNOSIS — Z794 Long term (current) use of insulin: Secondary | ICD-10-CM

## 2018-11-04 DIAGNOSIS — E1065 Type 1 diabetes mellitus with hyperglycemia: Secondary | ICD-10-CM | POA: Diagnosis present

## 2018-11-04 DIAGNOSIS — Z20828 Contact with and (suspected) exposure to other viral communicable diseases: Secondary | ICD-10-CM | POA: Diagnosis present

## 2018-11-04 DIAGNOSIS — I10 Essential (primary) hypertension: Secondary | ICD-10-CM | POA: Diagnosis not present

## 2018-11-04 DIAGNOSIS — Z8249 Family history of ischemic heart disease and other diseases of the circulatory system: Secondary | ICD-10-CM

## 2018-11-04 DIAGNOSIS — K219 Gastro-esophageal reflux disease without esophagitis: Secondary | ICD-10-CM | POA: Diagnosis present

## 2018-11-04 DIAGNOSIS — M1990 Unspecified osteoarthritis, unspecified site: Secondary | ICD-10-CM | POA: Diagnosis present

## 2018-11-04 DIAGNOSIS — N3090 Cystitis, unspecified without hematuria: Secondary | ICD-10-CM | POA: Diagnosis present

## 2018-11-04 DIAGNOSIS — D649 Anemia, unspecified: Secondary | ICD-10-CM | POA: Diagnosis present

## 2018-11-04 DIAGNOSIS — J45909 Unspecified asthma, uncomplicated: Secondary | ICD-10-CM | POA: Diagnosis present

## 2018-11-04 DIAGNOSIS — R000 Tachycardia, unspecified: Secondary | ICD-10-CM | POA: Diagnosis present

## 2018-11-04 DIAGNOSIS — Z79899 Other long term (current) drug therapy: Secondary | ICD-10-CM

## 2018-11-04 DIAGNOSIS — E876 Hypokalemia: Secondary | ICD-10-CM | POA: Diagnosis not present

## 2018-11-04 DIAGNOSIS — Z888 Allergy status to other drugs, medicaments and biological substances status: Secondary | ICD-10-CM

## 2018-11-04 DIAGNOSIS — Z91018 Allergy to other foods: Secondary | ICD-10-CM

## 2018-11-04 DIAGNOSIS — N136 Pyonephrosis: Secondary | ICD-10-CM | POA: Diagnosis present

## 2018-11-04 DIAGNOSIS — IMO0002 Reserved for concepts with insufficient information to code with codable children: Secondary | ICD-10-CM | POA: Diagnosis present

## 2018-11-04 DIAGNOSIS — R339 Retention of urine, unspecified: Secondary | ICD-10-CM

## 2018-11-04 LAB — COMPREHENSIVE METABOLIC PANEL
ALT: 11 U/L (ref 0–44)
AST: 15 U/L (ref 15–41)
Albumin: 4 g/dL (ref 3.5–5.0)
Alkaline Phosphatase: 92 U/L (ref 38–126)
Anion gap: 10 (ref 5–15)
BUN: 14 mg/dL (ref 6–20)
CO2: 24 mmol/L (ref 22–32)
Calcium: 9.4 mg/dL (ref 8.9–10.3)
Chloride: 106 mmol/L (ref 98–111)
Creatinine, Ser: 0.72 mg/dL (ref 0.44–1.00)
GFR calc Af Amer: >60 mL/min (ref >60)
GFR calc non Af Amer: >60 mL/min (ref >60)
Glucose, Bld: 191 mg/dL — ABNORMAL HIGH (ref 70–99)
Potassium: 3.6 mmol/L (ref 3.5–5.1)
Sodium: 140 mmol/L (ref 135–145)
Total Bilirubin: 1.3 mg/dL — ABNORMAL HIGH (ref 0.3–1.2)
Total Protein: 7.9 g/dL (ref 6.5–8.1)

## 2018-11-04 LAB — CBC WITH DIFFERENTIAL/PLATELET
Abs Immature Granulocytes: 0.02 10*3/uL (ref 0.00–0.07)
Basophils Absolute: 0 10*3/uL (ref 0.0–0.1)
Basophils Relative: 0 %
Eosinophils Absolute: 0.3 10*3/uL (ref 0.0–0.5)
Eosinophils Relative: 3 %
HCT: 37.2 % (ref 36.0–46.0)
Hemoglobin: 11.9 g/dL — ABNORMAL LOW (ref 12.0–15.0)
Immature Granulocytes: 0 %
Lymphocytes Relative: 18 %
Lymphs Abs: 1.7 10*3/uL (ref 0.7–4.0)
MCH: 28.8 pg (ref 26.0–34.0)
MCHC: 32 g/dL (ref 30.0–36.0)
MCV: 90.1 fL (ref 80.0–100.0)
Monocytes Absolute: 0.7 10*3/uL (ref 0.1–1.0)
Monocytes Relative: 7 %
Neutro Abs: 6.8 10*3/uL (ref 1.7–7.7)
Neutrophils Relative %: 72 %
Platelets: 487 10*3/uL — ABNORMAL HIGH (ref 150–400)
RBC: 4.13 MIL/uL (ref 3.87–5.11)
RDW: 13.6 % (ref 11.5–15.5)
WBC: 9.5 10*3/uL (ref 4.0–10.5)
nRBC: 0 % (ref 0.0–0.2)

## 2018-11-04 LAB — I-STAT CHEM 8, ED
BUN: 13 mg/dL (ref 6–20)
Calcium, Ion: 1.17 mmol/L (ref 1.15–1.40)
Chloride: 104 mmol/L (ref 98–111)
Creatinine, Ser: 0.6 mg/dL (ref 0.44–1.00)
Glucose, Bld: 187 mg/dL — ABNORMAL HIGH (ref 70–99)
HCT: 37 % (ref 36.0–46.0)
Hemoglobin: 12.6 g/dL (ref 12.0–15.0)
Potassium: 3.7 mmol/L (ref 3.5–5.1)
Sodium: 141 mmol/L (ref 135–145)
TCO2: 23 mmol/L (ref 22–32)

## 2018-11-04 LAB — CBG MONITORING, ED: Glucose-Capillary: 152 mg/dL — ABNORMAL HIGH (ref 70–99)

## 2018-11-04 LAB — LIPASE, BLOOD: Lipase: 13 U/L (ref 11–51)

## 2018-11-04 LAB — I-STAT BETA HCG BLOOD, ED (MC, WL, AP ONLY): I-stat hCG, quantitative: <5 m[IU]/mL (ref <5)

## 2018-11-04 MED ORDER — ONDANSETRON HCL 4 MG/2ML IJ SOLN
4.0000 mg | Freq: Once | INTRAMUSCULAR | Status: AC
  Filled 2018-11-04: qty 2

## 2018-11-04 MED ORDER — METOCLOPRAMIDE HCL 5 MG/ML IJ SOLN
10.0000 mg | Freq: Once | INTRAMUSCULAR | Status: AC
  Administered 2018-11-04: 10 mg via INTRAVENOUS
  Filled 2018-11-04: qty 2

## 2018-11-04 MED ORDER — SODIUM CHLORIDE 0.9 % IV BOLUS
1000.0000 mL | Freq: Once | INTRAVENOUS | Status: AC
  Administered 2018-11-04: 1000 mL via INTRAVENOUS

## 2018-11-04 MED ORDER — MORPHINE SULFATE (PF) 2 MG/ML IV SOLN
1.0000 mg | INTRAVENOUS | Status: DC | PRN
  Administered 2018-11-05 (×3): 1 mg via INTRAVENOUS
  Filled 2018-11-04 (×3): qty 1

## 2018-11-04 MED ORDER — KETOROLAC TROMETHAMINE 15 MG/ML IJ SOLN
15.0000 mg | Freq: Once | INTRAMUSCULAR | Status: AC
  Administered 2018-11-04: 15 mg via INTRAVENOUS
  Filled 2018-11-04: qty 1

## 2018-11-04 MED ORDER — ONDANSETRON HCL 4 MG PO TABS
4.0000 mg | ORAL_TABLET | Freq: Four times a day (QID) | ORAL | Status: DC | PRN
  Administered 2018-11-06: 4 mg via ORAL
  Filled 2018-11-04: qty 1

## 2018-11-04 MED ORDER — ONDANSETRON HCL 4 MG/2ML IJ SOLN
4.0000 mg | Freq: Once | INTRAMUSCULAR | Status: AC
  Administered 2018-11-04: 4 mg via INTRAVENOUS
  Filled 2018-11-04: qty 2

## 2018-11-04 MED ORDER — KETOROLAC TROMETHAMINE 30 MG/ML IJ SOLN
30.0000 mg | Freq: Once | INTRAMUSCULAR | Status: AC
  Administered 2018-11-04: 18:00:00 30 mg via INTRAVENOUS
  Filled 2018-11-04: qty 1

## 2018-11-04 MED ORDER — ACETAMINOPHEN 325 MG PO TABS
650.0000 mg | ORAL_TABLET | Freq: Four times a day (QID) | ORAL | Status: DC | PRN
  Filled 2018-11-04: qty 2

## 2018-11-04 MED ORDER — ENOXAPARIN SODIUM 40 MG/0.4ML ~~LOC~~ SOLN
40.0000 mg | Freq: Every day | SUBCUTANEOUS | Status: DC
  Administered 2018-11-05 – 2018-11-07 (×3): 40 mg via SUBCUTANEOUS
  Filled 2018-11-04 (×3): qty 0.4

## 2018-11-04 MED ORDER — PROMETHAZINE HCL 25 MG/ML IJ SOLN
12.5000 mg | Freq: Once | INTRAMUSCULAR | Status: AC
  Administered 2018-11-04: 12.5 mg via INTRAVENOUS
  Filled 2018-11-04: qty 1

## 2018-11-04 MED ORDER — ACETAMINOPHEN 650 MG RE SUPP
650.0000 mg | Freq: Four times a day (QID) | RECTAL | Status: DC | PRN
  Administered 2018-11-07: 650 mg via RECTAL
  Filled 2018-11-04: qty 1

## 2018-11-04 MED ORDER — ONDANSETRON HCL 4 MG/2ML IJ SOLN
4.0000 mg | Freq: Four times a day (QID) | INTRAMUSCULAR | Status: DC | PRN
  Administered 2018-11-05 – 2018-11-10 (×18): 4 mg via INTRAVENOUS
  Filled 2018-11-04 (×18): qty 2

## 2018-11-04 MED ORDER — HALOPERIDOL LACTATE 5 MG/ML IJ SOLN
5.0000 mg | Freq: Once | INTRAMUSCULAR | Status: AC
  Administered 2018-11-04: 5 mg via INTRAVENOUS
  Filled 2018-11-04: qty 1

## 2018-11-04 MED ORDER — KETOROLAC TROMETHAMINE 15 MG/ML IJ SOLN
15.0000 mg | Freq: Once | INTRAMUSCULAR | Status: AC
  Administered 2018-11-05: 15 mg via INTRAVENOUS
  Filled 2018-11-04: qty 1

## 2018-11-04 MED ORDER — PANTOPRAZOLE SODIUM 40 MG IV SOLR
40.0000 mg | Freq: Every day | INTRAVENOUS | Status: DC
  Administered 2018-11-05 – 2018-11-10 (×7): 40 mg via INTRAVENOUS
  Filled 2018-11-04 (×7): qty 40

## 2018-11-04 MED ORDER — ALBUTEROL SULFATE (2.5 MG/3ML) 0.083% IN NEBU: 3.0000 mL | INHALATION_SOLUTION | Freq: Four times a day (QID) | RESPIRATORY_TRACT | Status: DC | PRN

## 2018-11-04 MED ORDER — PROMETHAZINE HCL 25 MG/ML IJ SOLN
12.5000 mg | Freq: Four times a day (QID) | INTRAMUSCULAR | Status: DC | PRN
  Administered 2018-11-05 (×2): 12.5 mg via INTRAVENOUS
  Filled 2018-11-04 (×2): qty 1

## 2018-11-04 MED ORDER — DIPHENHYDRAMINE HCL 50 MG/ML IJ SOLN
50.0000 mg | Freq: Once | INTRAMUSCULAR | Status: AC
  Administered 2018-11-04: 50 mg via INTRAVENOUS
  Filled 2018-11-04: qty 1

## 2018-11-04 MED ORDER — LACTATED RINGERS IV SOLN
INTRAVENOUS | Status: AC
  Administered 2018-11-05 (×2): via INTRAVENOUS

## 2018-11-04 MED ORDER — PROMETHAZINE HCL 25 MG/ML IJ SOLN
25.0000 mg | Freq: Once | INTRAMUSCULAR | Status: AC
  Administered 2018-11-04: 25 mg via INTRAVENOUS
  Filled 2018-11-04: qty 1

## 2018-11-04 MED ORDER — INSULIN ASPART 100 UNIT/ML ~~LOC~~ SOLN
0.0000 [IU] | SUBCUTANEOUS | Status: DC
  Administered 2018-11-04 – 2018-11-06 (×2): 2 [IU] via SUBCUTANEOUS
  Administered 2018-11-06: 5 [IU] via SUBCUTANEOUS
  Administered 2018-11-06: 2 [IU] via SUBCUTANEOUS
  Administered 2018-11-06 – 2018-11-07 (×5): 3 [IU] via SUBCUTANEOUS
  Administered 2018-11-07 (×2): 2 [IU] via SUBCUTANEOUS
  Administered 2018-11-07: 3 [IU] via SUBCUTANEOUS
  Filled 2018-11-04: qty 0.09

## 2018-11-04 NOTE — ED Notes (Signed)
Pt keeps ripping BP cuff, EKG leads and pulse ox off despite being told that they needs to stay on.

## 2018-11-04 NOTE — ED Notes (Signed)
Attending gave verbal orders for nausea and pain meds for patient and instructed to slow fluid bolus from 999 mL an hour to 125 mL an hour.

## 2018-11-04 NOTE — ED Provider Notes (Signed)
[facility] DEPT Provider Note   CSN: [id number] Arrival date & time: 11/04/18  1618     History   Chief Complaint No chief complaint on file.   HPI [name] is a 28 y.o. female hx of DM with diabetic gastroparesis, cyclic vomiting syndrome, here presenting with vomiting.  She started vomiting since yesterday states that she has diffuse pain as well.  Patient denies any fevers.  She recently had Foley placed in her right hydronephrosis but no longer has a Foley catheter.  Denies any flank pain.  Patient was recently admitted for intractable vomiting secondary to gastroparesis.     The history is provided by the patient.    Past Medical History:  Diagnosis Date  . Anxiety   . Arthritis    "hands, feet, knees" (12/18/2016)  . Asthma   . Diabetic gastroparesis ([place])    Per gastric emptying study 07/09/16 which showed significant delayed gastric emptying.  . Gallstones   . Gastroparesis   . GERD (gastroesophageal reflux disease)   . Heart murmur   . Hepatic steatosis 11/26/2014   and hepatomegaly  . Hypertension    hx (12/18/2016)  . Intractable cyclical vomiting syndrome    Archie Endo 12/18/2016  . Liver mass 11/26/2014  . Pancreatitis, acute 11/26/2014  . Pneumonia    "as a teen X 1" (12/18/2016)  . Type I diabetes mellitus ([place]) 2007   IDDM.  poorly controlled, multiple admits with DKA    Patient Active Problem List   Diagnosis Date Noted  . Acute pyelonephritis 10/07/2018  . Hydronephrosis of right kidney 10/05/2018  . Diffuse pain   . Elevated blood pressure reading 12/03/2017  . Tachycardia 11/30/2017  . Hypernatremia 11/02/2017  . Nausea and vomiting 01/22/2017  . Nausea & vomiting 01/21/2017  . Intractable cyclical vomiting syndrome 12/18/2016  . Leukocytosis 10/24/2016  . N&V (nausea and vomiting) 10/23/2016  . DKA, type 1 ([place]) 09/26/2016  . Thrombocytosis ([place]) 07/08/2016  . Candiduria, asymptomatic 06/23/2016  .  Hyponatremia 06/13/2016  . Hematuria 05/13/2016  . UTI (urinary tract infection) 01/22/2016  . Diarrhea 11/09/2015  . Acute urinary retention   . Gastroparesis due to DM ([place]) 07/10/2015  . GERD (gastroesophageal reflux disease) 07/10/2015  . Depression with anxiety 07/10/2015  . Gastroparesis 06/22/2015  . Altered mental status 06/22/2015  . Volume depletion 06/10/2015  . Protein-calorie malnutrition, severe 06/10/2015  . Hyperglycemia   . Elevated bilirubin   . Hematemesis with nausea   . Intractable nausea and vomiting 05/28/2015  . Abdominal pain in female   . Abdominal pain 05/24/2015  . Hypertension 05/24/2015  . Dehydration   . Diabetic gastroparesis ([place])   . Chronic diastolic heart failure ([place]) 04/11/2015  . Hematemesis 04/08/2015  . DKA (diabetic ketoacidoses) ([place]) 03/22/2015  . S/P laparoscopic cholecystectomy 02/11/2015  . Postextubation stridor   . Pancreatitis, acute 11/26/2014  . Volume overload 11/26/2014  . Hypokalemia 11/26/2014  . Hepatic steatosis 11/26/2014  . Liver mass 11/26/2014  . Sepsis ([place]) 11/25/2014  . Sinus tachycardia 11/25/2014  . Hypomagnesemia 11/25/2014  . Hypophosphatemia 11/25/2014  . Elevated LFTs 11/24/2014  . AKI (acute kidney injury) ([place]) 11/24/2014  . Migraine headache 11/24/2014  . Asthma 06/29/2012  . Uncontrolled type 1 diabetes mellitus ([place]) 06/19/2010  . Goiter, unspecified 06/19/2010    Past Surgical History:  Procedure Laterality Date  . CHOLECYSTECTOMY N/A 02/11/2015   Procedure: LAPAROSCOPIC CHOLECYSTECTOMY WITH INTRAOPERATIVE CHOLANGIOGRAM;  Surgeon: Greer Pickerel, MD;  Location: [facility]S;  Service: General;  Laterality: N/A;  . ESOPHAGOGASTRODUODENOSCOPY (EGD) WITH PROPOFOL Left 09/20/2014   Procedure: ESOPHAGOGASTRODUODENOSCOPY (EGD) WITH PROPOFOL;  Surgeon: Arta Silence, MD;  Location: [facility] ENDOSCOPY;  Service: Endoscopy;  Laterality: Left;  . WISDOM TOOTH EXTRACTION       OB History    Gravida  2   Para  1    Term  0   Preterm  1   AB  1   Living  1     SAB  0   TAB  1   Ectopic  0   Multiple  0   Live Births  1            Home Medications    Prior to Admission medications   Medication Sig Start Date End Date Taking? Authorizing Provider  albuterol (PROVENTIL HFA;VENTOLIN HFA) 108 (90 Base) MCG/ACT inhaler Inhale 1-2 puffs into the lungs every 6 (six) hours as needed for wheezing or shortness of breath.    [provider]  blood glucose meter kit and supplies Dispense based on patient and insurance preference. Use up to four times daily as directed. (FOR ICD-10 E10.9, E11.9). 12/07/17   Eugenie Filler, MD  glucagon (GLUCAGON EMERGENCY) 1 MG injection Inject 1 mg into the muscle once as needed (For very low blood sugars. May repeat in 15-20 minutes if needed.). 12/20/16   Hongalgi, Lenis Dickinson, MD  Insulin Human (INSULIN PUMP) SOLN Inject 1 each into the skin continuous. Novolog insulin. Pt puts in her insulin pump: Medtronic 630G. Carb ratio 1-10, insulin sensitive 66. 1.10 from 12 to 12. Every 3 days needs to be replaced.    [provider]  Insulin Pen Needle 31G X 5 MM MISC 30 Units by Does not apply route at bedtime. 12/07/17   Eugenie Filler, MD  lisinopril (ZESTRIL) 2.5 MG tablet Take 2.5 mg by mouth daily.    [provider]  metoCLOPramide (REGLAN) 10 MG tablet Take 1 tablet (10 mg total) by mouth 3 (three) times daily with meals. Patient taking differently: Take 10 mg by mouth 3 (three) times daily as needed for nausea or vomiting.  11/07/17 11/07/18  Debbe Odea, MD  ondansetron (ZOFRAN ODT) 4 MG disintegrating tablet Take 1 tablet (4 mg total) by mouth every 8 (eight) hours as needed for nausea or vomiting. 10/16/18   Arrien, Jimmy Picket, MD  pantoprazole (PROTONIX) 40 MG tablet Take 1 tablet (40 mg total) by mouth daily for 7 days. 10/16/18 10/23/18  Arrien, Jimmy Picket, MD  promethazine (PHENERGAN) 25 MG suppository Place 1  suppository (25 mg total) rectally every 6 (six) hours as needed for nausea or vomiting. 10/16/18   Arrien, Jimmy Picket, MD  trimethoprim (TRIMPEX) 100 MG tablet Take 100 mg by mouth daily. UTI prophylaxis. 09/09/18   [provider]    Family History Family History  Problem Relation Age of Onset  . Heart disease Maternal Grandmother   . Heart disease Maternal Grandfather   . Diabetes Mother   . Hyperlipidemia Mother   . Hypertension Father   . Heart disease Father   . Hypertension Paternal Grandmother   . Cancer Paternal Grandfather     Social History Social History   Tobacco Use  . Smoking status: Never Smoker  . Smokeless tobacco: Never Used  Substance Use Topics  . Alcohol use: No  . Drug use: Yes    Types: Marijuana    Comment: 12/18/2016 q few days"     Allergies  Peanut-containing drug products, Food, and Ultram [tramadol]   Review of Systems Review of Systems  Gastrointestinal: Positive for abdominal pain and vomiting.  All other systems reviewed and are negative.    Physical Exam Updated Vital Signs BP 107/76   Pulse (!) 118   Temp (!) 96.6 F (35.9 C) (Axillary)   Resp (!) 22   Ht 5' 3"  (1.6 m)   Wt 59 kg   LMP 10/02/2018   SpO2 97%   BMI 23.03 kg/m   Physical Exam Vitals signs and nursing note reviewed.  Constitutional:      Comments: Uncomfortable   HENT:     Head: Normocephalic.     Nose: Nose normal.     Mouth/Throat:     Mouth: Mucous membranes are dry.  Eyes:     Extraocular Movements: Extraocular movements intact.     Pupils: Pupils are equal, round, and reactive to light.  Neck:     Musculoskeletal: Normal range of motion.  Cardiovascular:     Pulses: Normal pulses.     Comments: Tachycardic  Pulmonary:     Effort: Pulmonary effort is normal.  Abdominal:     General: Abdomen is flat.     Comments: + mild diffuse tenderness, no rebound   Musculoskeletal: Normal range of motion.  Skin:    General: Skin is  warm.     Capillary Refill: Capillary refill takes less than 2 seconds.  Neurological:     General: No focal deficit present.  Psychiatric:        Mood and Affect: Mood normal.      ED Treatments / Results  Labs (all labs ordered are listed, but only abnormal results are displayed) Labs Reviewed  CBC WITH DIFFERENTIAL/PLATELET - Abnormal; Notable for the following components:      Result Value   Hemoglobin 11.9 (*)    Platelets 487 (*)    All other components within normal limits  COMPREHENSIVE METABOLIC PANEL - Abnormal; Notable for the following components:   Glucose, Bld 191 (*)    Total Bilirubin 1.3 (*)    All other components within normal limits  I-STAT CHEM 8, ED - Abnormal; Notable for the following components:   Glucose, Bld 187 (*)    All other components within normal limits  SARS CORONAVIRUS 2 (TAT 6-24 HRS)  LIPASE, BLOOD  URINALYSIS, ROUTINE W REFLEX MICROSCOPIC  RAPID URINE DRUG SCREEN, HOSP PERFORMED  I-STAT BETA HCG BLOOD, ED ([facility], [facility], [facility] ONLY)    EKG None  Radiology No results found.  Procedures Procedures (including critical care time)  Angiocath insertion Performed by: Wandra Arthurs  Consent: Verbal consent obtained. Risks and benefits: risks, benefits and alternatives were discussed Time out: Immediately prior to procedure a "time out" was called to verify the correct patient, procedure, equipment, support staff and site/side marked as required.  Preparation: Patient was prepped and draped in the usual sterile fashion.  Vein Location: R brachial  Ultrasound Guided  Gauge: 20 long   Normal blood return and flush without difficulty Patient tolerance: Patient tolerated the procedure well with no immediate complications.     Medications Ordered in ED Medications  sodium chloride 0.9 % bolus 1,000 mL (has no administration in time range)  ondansetron (ZOFRAN) injection 4 mg (has no administration in time range)  sodium chloride 0.9 %  bolus 1,000 mL (1,000 mLs Intravenous New Bag/Given 11/04/18 1750)  promethazine (PHENERGAN) injection 25 mg (25 mg Intravenous Given 11/04/18 1746)  haloperidol lactate (  HALDOL) injection 5 mg (5 mg Intravenous Given 11/04/18 1745)  metoCLOPramide (REGLAN) injection 10 mg (10 mg Intravenous Given 11/04/18 1745)  diphenhydrAMINE (BENADRYL) injection 50 mg (50 mg Intravenous Given 11/04/18 1745)  ketorolac (TORADOL) 30 MG/ML injection 30 mg (30 mg Intravenous Given 11/04/18 1745)     Initial Impression / Assessment and Plan / ED Course  I have reviewed the triage vital signs and the nursing notes.  Pertinent labs & imaging results that were available during my care of the patient were reviewed by me and considered in my medical decision making (see chart for details).       [name] is a 28 y.o. female here with abdominal pain, vomiting. Hx of gastroparesis and hx of cyclical vomiting. I suspect this is recurrent problem. Will give nausea meds. Will hydrate and reassess.   7:18 PM Chemistry showed glucose 187. Still dry heaving and unable to keep anything down. Will admit for intractable cyclic vomiting syndrome.   Final Clinical Impressions(s) / ED Diagnoses   Final diagnoses:  None    ED Discharge Orders    None       Drenda Freeze, MD 11/04/18 1919

## 2018-11-04 NOTE — ED Triage Notes (Signed)
Pt states her gastroparesis has been acting up since yesterday.  Pt states pain 10/10

## 2018-11-04 NOTE — H&P (Signed)
History and Physical    [name] QBH:[id number] DOB: 1990-08-28 DOA: 11/04/2018  PCP: Vicenta Aly, FNP  Patient coming from: Home.  Chief Complaint: Nausea vomiting.  HPI: [name] is a 28 y.o. female with history of diabetes mellitus type 1 and admissions for diabetic gastroparesis recently admitted for pyelonephritis with obstructive uropathy which improved with placement of Foley at that time patient also had gastroparesis with nausea vomiting presents to the ER with complaints of having intractable nausea vomiting with diffuse abdominal cramping pain characteristic of her diabetic gastroparesis.  Denies any blood in the vomitus.  Denies any diarrhea.  Denies any fever chills chest pain or productive cough.  ED Course: In the ER abdomen appears benign patient has been having persistent vomiting.  Labs show creatinine 1.7 LFTs were largely unremarkable.  Lipase was normal.  CBC shows hemoglobin 11.9 WBC 9.5 platelets 47 patient's anion gap was 10.  Since patient had hydronephrosis last time sonogram abdomen was done which was unremarkable.  Patient admitted for intractable nausea vomiting likely from diabetic gastroparesis.  UA and urine drug screen is pending.  COVID-19 is negative.  Review of Systems: As per HPI, rest all negative.   Past Medical History:  Diagnosis Date  . Anxiety   . Arthritis    "hands, feet, knees" (12/18/2016)  . Asthma   . Diabetic gastroparesis ([place])    Per gastric emptying study 07/09/16 which showed significant delayed gastric emptying.  . Gallstones   . Gastroparesis   . GERD (gastroesophageal reflux disease)   . Heart murmur   . Hepatic steatosis 11/26/2014   and hepatomegaly  . Hypertension    hx (12/18/2016)  . Intractable cyclical vomiting syndrome    Archie Endo 12/18/2016  . Liver mass 11/26/2014  . Pancreatitis, acute 11/26/2014  . Pneumonia    "as a teen X 1" (12/18/2016)  . Type I diabetes mellitus ([place]) 2007   IDDM.   poorly controlled, multiple admits with DKA    Past Surgical History:  Procedure Laterality Date  . CHOLECYSTECTOMY N/A 02/11/2015   Procedure: LAPAROSCOPIC CHOLECYSTECTOMY WITH INTRAOPERATIVE CHOLANGIOGRAM;  Surgeon: Greer Pickerel, MD;  Location: [facility] ORS;  Service: General;  Laterality: N/A;  . ESOPHAGOGASTRODUODENOSCOPY (EGD) WITH PROPOFOL Left 09/20/2014   Procedure: ESOPHAGOGASTRODUODENOSCOPY (EGD) WITH PROPOFOL;  Surgeon: Arta Silence, MD;  Location: [facility] ENDOSCOPY;  Service: Endoscopy;  Laterality: Left;  . WISDOM TOOTH EXTRACTION       reports that she has never smoked. She has never used smokeless tobacco. She reports current drug use. Drug: Marijuana. She reports that she does not drink alcohol.  Allergies  Allergen Reactions  . Peanut-Containing Drug Products Swelling and Other (See Comments)    Reaction:  Swelling of mouth and lips   . 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   Reaction:  Swelling of mouth and lips   . Ultram [Tramadol] Itching    Family History  Problem Relation Age of Onset  . Heart disease Maternal Grandmother   . Heart disease Maternal Grandfather   . Diabetes Mother   . Hyperlipidemia Mother   . Hypertension Father   . Heart disease Father   . Hypertension Paternal Grandmother   . Cancer Paternal Grandfather     Prior to Admission medications   Medication Sig Start Date End Date Taking? Authorizing Provider  Insulin Human (INSULIN PUMP) SOLN Inject 1 each into the skin continuous. Novolog insulin. Pt puts in her insulin pump: Medtronic 630G.  Carb ratio 1-10, insulin sensitive 66. 1.10 from 12 to 12. Every 3 days needs to be replaced.   Yes [provider]  lisinopril (ZESTRIL) 2.5 MG tablet Take 2.5 mg by mouth daily.   Yes [provider]  metoCLOPramide (REGLAN) 10 MG tablet Take 1 tablet (10 mg total) by mouth 3 (three) times daily with meals. Patient taking differently: Take 10 mg by mouth 3  (three) times daily as needed for nausea or vomiting.  11/07/17 11/07/18 Yes Debbe Odea, MD  ondansetron (ZOFRAN ODT) 4 MG disintegrating tablet Take 1 tablet (4 mg total) by mouth every 8 (eight) hours as needed for nausea or vomiting. 10/16/18  Yes Arrien, Jimmy Picket, MD  albuterol (PROVENTIL HFA;VENTOLIN HFA) 108 (90 Base) MCG/ACT inhaler Inhale 1-2 puffs into the lungs every 6 (six) hours as needed for wheezing or shortness of breath.    [provider]  blood glucose meter kit and supplies Dispense based on patient and insurance preference. Use up to four times daily as directed. (FOR ICD-10 E10.9, E11.9). 12/07/17   Eugenie Filler, MD  glucagon (GLUCAGON EMERGENCY) 1 MG injection Inject 1 mg into the muscle once as needed (For very low blood sugars. May repeat in 15-20 minutes if needed.). 12/20/16   Hongalgi, Lenis Dickinson, MD  Insulin Pen Needle 31G X 5 MM MISC 30 Units by Does not apply route at bedtime. 12/07/17   Eugenie Filler, MD  pantoprazole (PROTONIX) 40 MG tablet Take 1 tablet (40 mg total) by mouth daily for 7 days. Patient not taking: Reported on 11/04/2018 10/16/18 10/23/18  Arrien, Jimmy Picket, MD  promethazine (PHENERGAN) 25 MG suppository Place 1 suppository (25 mg total) rectally every 6 (six) hours as needed for nausea or vomiting. Patient not taking: Reported on 11/04/2018 10/16/18   Arrien, Jimmy Picket, MD    Physical Exam: Constitutional: Moderately built and nourished. Vitals:   11/04/18 2009 11/04/18 2010 11/04/18 2117 11/04/18 2210  BP: 126/89  (!) 136/91 (!) 138/96  Pulse:  (!) 113 (!) 110 (!) 111  Resp:  (!) 29 20 16   Temp:    98.9 F (37.2 C)  TempSrc:    Oral  SpO2:  100% 100% 100%  Weight:      Height:       Eyes: Anicteric no pallor. ENMT: No discharge from the ears eyes nose and mouth. Neck: No mass felt.  No neck rigidity. Respiratory: No rhonchi or crepitations. Cardiovascular: S1-S2 heard. Abdomen: Soft nontender bowel  sounds present. Musculoskeletal: No edema. Skin: No rash. Neurologic: Alert awake oriented to time place and person.  Moves all extremities. Psychiatric: Appears normal per normal affect.   Labs on Admission: I have personally reviewed following labs and imaging studies  CBC: Recent Labs  Lab 11/04/18 1752 11/04/18 1759  WBC 9.5  --   NEUTROABS 6.8  --   HGB 11.9* 12.6  HCT 37.2 37.0  MCV 90.1  --   PLT 487*  --    Basic Metabolic Panel: Recent Labs  Lab 11/04/18 1752 11/04/18 1759  NA 140 141  K 3.6 3.7  CL 106 104  CO2 24  --   GLUCOSE 191* 187*  BUN 14 13  CREATININE 0.72 0.60  CALCIUM 9.4  --    GFR: Estimated Creatinine Clearance: 85.8 mL/min (by C-G formula based on SCr of 0.6 mg/dL). Liver Function Tests: Recent Labs  Lab 11/04/18 1752  AST 15  ALT 11  ALKPHOS 92  BILITOT 1.3*  PROT 7.9  ALBUMIN 4.0   Recent Labs  Lab 11/04/18 1752  LIPASE 13   No results for input(s): AMMONIA in the last 168 hours. Coagulation Profile: No results for input(s): INR, PROTIME in the last 168 hours. Cardiac Enzymes: No results for input(s): CKTOTAL, CKMB, CKMBINDEX, TROPONINI in the last 168 hours. BNP (last 3 results) No results for input(s): PROBNP in the last 8760 hours. HbA1C: No results for input(s): HGBA1C in the last 72 hours. CBG: Recent Labs  Lab 11/04/18 2001  GLUCAP 152*   Lipid Profile: No results for input(s): CHOL, HDL, LDLCALC, TRIG, CHOLHDL, LDLDIRECT in the last 72 hours. Thyroid Function Tests: No results for input(s): TSH, T4TOTAL, FREET4, T3FREE, THYROIDAB in the last 72 hours. Anemia Panel: No results for input(s): VITAMINB12, FOLATE, FERRITIN, TIBC, IRON, RETICCTPCT in the last 72 hours. Urine analysis:    Component Value Date/Time   COLORURINE RED (A) 10/14/2018 0456   APPEARANCEUR TURBID (A) 10/14/2018 0456   LABSPEC 1.016 10/14/2018 0456   PHURINE 6.0 10/14/2018 0456   GLUCOSEU >=500 (A) 10/14/2018 0456   GLUCOSEU >=1000  11/07/2012 1205   HGBUR LARGE (A) 10/14/2018 0456   BILIRUBINUR NEGATIVE 10/14/2018 0456   KETONESUR 80 (A) 10/14/2018 0456   PROTEINUR >=300 (A) 10/14/2018 0456   UROBILINOGEN 0.2 11/24/2014 1045   NITRITE NEGATIVE 10/14/2018 0456   LEUKOCYTESUR MODERATE (A) 10/14/2018 0456   Sepsis Labs: @LABRCNTIP (procalcitonin:4,lacticidven:4) )No results found for this or any previous visit (from the past 240 hour(s)).   Radiological Exams on Admission: US Abdomen Complete  Result Date: 11/04/2018 CLINICAL DATA:  Nausea and vomiting. EXAM: ABDOMEN ULTRASOUND COMPLETE COMPARISON:  CT 10/04/2018 FINDINGS: Gallbladder: Surgically absent. Common bile duct: Diameter: 3 mm. Liver: Subtle liver lesions on prior CT are not defined sonographically. Within normal limits in parenchymal echogenicity. Portal vein is patent on color Doppler imaging with normal direction of blood flow towards the liver. IVC: No abnormality visualized. Pancreas: Visualized portion unremarkable. Spleen: Size and appearance within normal limits. Right Kidney: Length: 10.5 cm. Hydronephrosis on prior exam has resolved. Echogenicity within normal limits. Left Kidney: Length: 10.5 cm. Echogenicity within normal limits. No mass or hydronephrosis visualized. Abdominal aorta: No aneurysm visualized. Other findings: None. IMPRESSION: 1. Post cholecystectomy without biliary dilatation. 2. Resolved right hydronephrosis from CT last month. 3. Liver lesions on prior CT are not defined sonographically. Electronically Signed   By: Keith Rake M.D.   On: 11/04/2018 21:33     Assessment/Plan Active Problems:   Uncontrolled type 1 diabetes mellitus (H[facility])   Hypertension   Intractable nausea and vomiting   Nausea & vomiting    1. Intractable nausea vomiting likely from diabetic gastroparesis.  Last hemoglobin A1c last month was around 8.7.  We will keep patient on full liquid diet and IV fluids pain relief medications and antiemetics. 2.  Diabetes mellitus type 1 patient states she only takes sliding scale coverage.  Closely follow CBGs. 3. Chronic anemia follow CBC.  UA and urine drug screen are pending.   DVT prophylaxis: Lovenox. Code Status: Full code. Family Communication: Discussed with patient. Disposition Plan: Home. Consults called: None. Admission status: Observation.   Rise Patience MD [facility] Pager [phone].  If 7PM-7AM, please contact night-coverage [email or web address] Password [email or web address]  11/04/2018, 11:55 PM

## 2018-11-04 NOTE — ED Notes (Signed)
Attempted to call and give report, nurse stated she would call back.

## 2018-11-04 NOTE — ED Notes (Signed)
Pt informed, again, that a urine sample is needed. Pt just keeps asking for pain and nausea meds. Admitting physician notified.

## 2018-11-05 ENCOUNTER — Other Ambulatory Visit: Payer: Self-pay

## 2018-11-05 DIAGNOSIS — E10649 Type 1 diabetes mellitus with hypoglycemia without coma: Secondary | ICD-10-CM | POA: Diagnosis not present

## 2018-11-05 DIAGNOSIS — N390 Urinary tract infection, site not specified: Secondary | ICD-10-CM | POA: Diagnosis not present

## 2018-11-05 DIAGNOSIS — R011 Cardiac murmur, unspecified: Secondary | ICD-10-CM | POA: Diagnosis present

## 2018-11-05 DIAGNOSIS — B3749 Other urogenital candidiasis: Secondary | ICD-10-CM | POA: Diagnosis present

## 2018-11-05 DIAGNOSIS — E872 Acidosis: Secondary | ICD-10-CM | POA: Diagnosis not present

## 2018-11-05 DIAGNOSIS — D649 Anemia, unspecified: Secondary | ICD-10-CM | POA: Diagnosis present

## 2018-11-05 DIAGNOSIS — M1990 Unspecified osteoarthritis, unspecified site: Secondary | ICD-10-CM | POA: Diagnosis present

## 2018-11-05 DIAGNOSIS — N3090 Cystitis, unspecified without hematuria: Secondary | ICD-10-CM | POA: Diagnosis present

## 2018-11-05 DIAGNOSIS — Z794 Long term (current) use of insulin: Secondary | ICD-10-CM | POA: Diagnosis not present

## 2018-11-05 DIAGNOSIS — Z888 Allergy status to other drugs, medicaments and biological substances status: Secondary | ICD-10-CM | POA: Diagnosis not present

## 2018-11-05 DIAGNOSIS — Z833 Family history of diabetes mellitus: Secondary | ICD-10-CM | POA: Diagnosis not present

## 2018-11-05 DIAGNOSIS — E876 Hypokalemia: Secondary | ICD-10-CM | POA: Diagnosis not present

## 2018-11-05 DIAGNOSIS — R000 Tachycardia, unspecified: Secondary | ICD-10-CM | POA: Diagnosis present

## 2018-11-05 DIAGNOSIS — Z20828 Contact with and (suspected) exposure to other viral communicable diseases: Secondary | ICD-10-CM | POA: Diagnosis present

## 2018-11-05 DIAGNOSIS — R112 Nausea with vomiting, unspecified: Secondary | ICD-10-CM | POA: Diagnosis present

## 2018-11-05 DIAGNOSIS — K3184 Gastroparesis: Secondary | ICD-10-CM | POA: Diagnosis present

## 2018-11-05 DIAGNOSIS — Z8249 Family history of ischemic heart disease and other diseases of the circulatory system: Secondary | ICD-10-CM | POA: Diagnosis not present

## 2018-11-05 DIAGNOSIS — K219 Gastro-esophageal reflux disease without esophagitis: Secondary | ICD-10-CM | POA: Diagnosis present

## 2018-11-05 DIAGNOSIS — I10 Essential (primary) hypertension: Secondary | ICD-10-CM | POA: Diagnosis present

## 2018-11-05 DIAGNOSIS — N136 Pyonephrosis: Secondary | ICD-10-CM | POA: Diagnosis present

## 2018-11-05 DIAGNOSIS — E1143 Type 2 diabetes mellitus with diabetic autonomic (poly)neuropathy: Secondary | ICD-10-CM | POA: Diagnosis not present

## 2018-11-05 DIAGNOSIS — Z79899 Other long term (current) drug therapy: Secondary | ICD-10-CM | POA: Diagnosis not present

## 2018-11-05 DIAGNOSIS — I517 Cardiomegaly: Secondary | ICD-10-CM | POA: Diagnosis not present

## 2018-11-05 DIAGNOSIS — E1043 Type 1 diabetes mellitus with diabetic autonomic (poly)neuropathy: Secondary | ICD-10-CM | POA: Diagnosis present

## 2018-11-05 DIAGNOSIS — J45909 Unspecified asthma, uncomplicated: Secondary | ICD-10-CM | POA: Diagnosis present

## 2018-11-05 DIAGNOSIS — E1010 Type 1 diabetes mellitus with ketoacidosis without coma: Secondary | ICD-10-CM | POA: Diagnosis present

## 2018-11-05 DIAGNOSIS — F419 Anxiety disorder, unspecified: Secondary | ICD-10-CM | POA: Diagnosis present

## 2018-11-05 DIAGNOSIS — Z91018 Allergy to other foods: Secondary | ICD-10-CM | POA: Diagnosis not present

## 2018-11-05 DIAGNOSIS — Z91010 Allergy to peanuts: Secondary | ICD-10-CM | POA: Diagnosis not present

## 2018-11-05 LAB — SARS CORONAVIRUS 2 (TAT 6-24 HRS): SARS Coronavirus 2: NEGATIVE

## 2018-11-05 LAB — BASIC METABOLIC PANEL
Anion gap: 13 (ref 5–15)
BUN: 15 mg/dL (ref 6–20)
CO2: 23 mmol/L (ref 22–32)
Calcium: 9.7 mg/dL (ref 8.9–10.3)
Chloride: 108 mmol/L (ref 98–111)
Creatinine, Ser: 0.69 mg/dL (ref 0.44–1.00)
GFR calc Af Amer: >60 mL/min (ref >60)
GFR calc non Af Amer: >60 mL/min (ref >60)
Glucose, Bld: 93 mg/dL (ref 70–99)
Potassium: 3.7 mmol/L (ref 3.5–5.1)
Sodium: 144 mmol/L (ref 135–145)

## 2018-11-05 LAB — HEPATIC FUNCTION PANEL
ALT: 11 U/L (ref 0–44)
AST: 17 U/L (ref 15–41)
Albumin: 4.6 g/dL (ref 3.5–5.0)
Alkaline Phosphatase: 107 U/L (ref 38–126)
Bilirubin, Direct: 0.2 mg/dL (ref 0.0–0.2)
Indirect Bilirubin: 0.6 mg/dL (ref 0.3–0.9)
Total Bilirubin: 0.8 mg/dL (ref 0.3–1.2)
Total Protein: 8.9 g/dL — ABNORMAL HIGH (ref 6.5–8.1)

## 2018-11-05 LAB — GLUCOSE, CAPILLARY
Glucose-Capillary: 104 mg/dL — ABNORMAL HIGH (ref 70–99)
Glucose-Capillary: 107 mg/dL — ABNORMAL HIGH (ref 70–99)
Glucose-Capillary: 208 mg/dL — ABNORMAL HIGH (ref 70–99)
Glucose-Capillary: 35 mg/dL — CL (ref 70–99)
Glucose-Capillary: 48 mg/dL — ABNORMAL LOW (ref 70–99)
Glucose-Capillary: 83 mg/dL (ref 70–99)
Glucose-Capillary: 89 mg/dL (ref 70–99)
Glucose-Capillary: 95 mg/dL (ref 70–99)

## 2018-11-05 LAB — CBC
HCT: 41.4 % (ref 36.0–46.0)
Hemoglobin: 12.7 g/dL (ref 12.0–15.0)
MCH: 28.4 pg (ref 26.0–34.0)
MCHC: 30.7 g/dL (ref 30.0–36.0)
MCV: 92.6 fL (ref 80.0–100.0)
Platelets: 326 10*3/uL (ref 150–400)
RBC: 4.47 MIL/uL (ref 3.87–5.11)
RDW: 13.5 % (ref 11.5–15.5)
WBC: 7.4 10*3/uL (ref 4.0–10.5)
nRBC: 0 % (ref 0.0–0.2)

## 2018-11-05 MED ORDER — MORPHINE SULFATE (PF) 2 MG/ML IV SOLN
1.0000 mg | INTRAVENOUS | Status: DC | PRN
  Administered 2018-11-05 – 2018-11-10 (×25): 1 mg via INTRAVENOUS
  Filled 2018-11-05 (×26): qty 1

## 2018-11-05 MED ORDER — PROMETHAZINE HCL 25 MG/ML IJ SOLN
12.5000 mg | Freq: Four times a day (QID) | INTRAMUSCULAR | Status: DC | PRN
  Administered 2018-11-05 – 2018-11-09 (×12): 25 mg via INTRAVENOUS
  Administered 2018-11-09: 12.5 mg via INTRAVENOUS
  Administered 2018-11-09 – 2018-11-10 (×2): 25 mg via INTRAVENOUS
  Filled 2018-11-05 (×15): qty 1

## 2018-11-05 MED ORDER — METOCLOPRAMIDE HCL 10 MG/10ML PO SOLN
5.0000 mg | Freq: Three times a day (TID) | ORAL | Status: DC
  Administered 2018-11-05 (×3): 5 mg via ORAL
  Filled 2018-11-05 (×5): qty 10

## 2018-11-05 NOTE — Progress Notes (Signed)
Patient was c/o nausea and pain rated 10/10.Patient has had Phenergan and Zofran recently.  PCP was notified.

## 2018-11-05 NOTE — Progress Notes (Signed)
PROGRESS NOTE                                                                                                                                                                                                             Patient Demographics:    [name], is a 28 y.o. female, DOB - 1990/03/24, UA:[id number]  Admit date - 11/04/2018   Admitting Physician Rise Patience, MD  Outpatient Primary MD for the patient is Vicenta Aly, [place]  LOS - 0   No chief complaint on file.      Brief Narrative 28 year old female with uncontrolled type 1 diabetes mellitus with multiple hospitalization for diabetic gastroparesis, recently admitted for pyelonephritis with obstructive uropathy presented to the ED with intractable nausea and vomiting with diffuse abdominal cramping for past few days. In the ED patient had persistent vomiting .  Admitted for severe diabetic gastroparesis.    Subjective:   Patient reports he vomited all night and unable to keep anything down.  Complains of diffuse abdominal pain.  Assessment  & Plan :   Principal problem   Gastroparesis due to DM (HCC) Intractable nausea and vomiting Unable to keep anything down.  Keep n.p.o.  IV hydration with Ringer's lactate.  Continue PRN Zofran and Phenergan for nausea and vomiting.  Placed on scheduled Reglan 5 mg TID AC and HS. Monitor electrolytes and replenish.  Minimize narcotics for pain.   Active Problems:   Uncontrolled type 1 diabetes mellitus (HCC) Monitor CBG every 4 hours on sliding scale coverage.  Lantus 10 units at bedtime  Essential hypertension Lisinopril held.  Placed on PRN hydralazine.  Patient status: Inpatient Patient presented with intractable nausea and vomiting secondary to severe diabetic gastroparesis.  Has persistent nausea and vomiting, unable to keep anything down with severe dehydration.  Patient needs to be monitored with  aggressive IV hydration, IV antiemetics with high risk for deterioration of her symptoms including severe dehydration and hypo-and hyperglycemia.  Code Status : Full code  Family Communication  : None  Disposition Plan  : Home once symptoms improve  Barriers For Discharge : Active symptoms  Consults  : None  Procedures  : Renal ultrasound  DVT Prophylaxis  : Subcu Lovenox  Lab Results  Component Value Date   PLT 326 11/05/2018    Antibiotics  :  Anti-infectives (From  admission, onward)   None        Objective:   Vitals:   11/04/18 2117 11/04/18 2210 11/05/18 0436 11/05/18 0629  BP: (!) 136/91 (!) 138/96 (!) 139/95 (!) 138/96  Pulse: (!) 110 (!) 111 (!) 116 (!) 118  Resp: 20 16 16 14   Temp:  98.9 F (37.2 C) 98.9 F (37.2 C) 98.4 F (36.9 C)  TempSrc:  Oral Oral Oral  SpO2: 100% 100% 100% 100%  Weight:      Height:        Wt Readings from Last 3 Encounters:  11/04/18 59 kg  10/06/18 61 kg  10/04/18 61.6 kg     Intake/Output Summary (Last 24 hours) at 11/05/2018 1102 Last data filed at 11/05/2018 0601 Gross per 24 hour  Intake 2000 ml  Output --  Net 2000 ml     Physical Exam  Gen: Young female appears fatigued in some distress HEENT: Dry mucosa, supple neck Chest: Clear CVs: S1-S2 tachycardic, no murmurs GI: Soft, mild diffuse tenderness, nondistended, bowel sounds present Musculoskeletal: Warm, no edema    Data Review:    CBC Recent Labs  Lab 11/04/18 1752 11/04/18 1759 11/05/18 0652  WBC 9.5  --  7.4  HGB 11.9* 12.6 12.7  HCT 37.2 37.0 41.4  PLT 487*  --  326  MCV 90.1  --  92.6  MCH 28.8  --  28.4  MCHC 32.0  --  30.7  RDW 13.6  --  13.5  LYMPHSABS 1.7  --   --   MONOABS 0.7  --   --   EOSABS 0.3  --   --   BASOSABS 0.0  --   --     Chemistries  Recent Labs  Lab 11/04/18 1752 11/04/18 1759 11/05/18 0652  NA 140 141 144  K 3.6 3.7 3.7  CL 106 104 108  CO2 24  --  23  GLUCOSE 191* 187* 93  BUN 14 13 15    CREATININE 0.72 0.60 0.69  CALCIUM 9.4  --  9.7  AST 15  --  17  ALT 11  --  11  ALKPHOS 92  --  107  BILITOT 1.3*  --  0.8   ------------------------------------------------------------------------------------------------------------------ No results for input(s): CHOL, HDL, LDLCALC, TRIG, CHOLHDL, LDLDIRECT in the last 72 hours.  Lab Results  Component Value Date   HGBA1C 8.7 (H) 10/07/2018   ------------------------------------------------------------------------------------------------------------------ No results for input(s): TSH, T4TOTAL, T3FREE, THYROIDAB in the last 72 hours.  Invalid input(s): FREET3 ------------------------------------------------------------------------------------------------------------------ No results for input(s): VITAMINB12, FOLATE, FERRITIN, TIBC, IRON, RETICCTPCT in the last 72 hours.  Coagulation profile No results for input(s): INR, PROTIME in the last 168 hours.  No results for input(s): DDIMER in the last 72 hours.  Cardiac Enzymes No results for input(s): CKMB, TROPONINI, MYOGLOBIN in the last 168 hours.  Invalid input(s): CK ------------------------------------------------------------------------------------------------------------------    Component Value Date/Time   BNP 6.9 07/11/2015 0353    Inpatient Medications  Scheduled Meds:  enoxaparin (LOVENOX) injection  40 mg Subcutaneous Daily   insulin aspart  0-9 Units Subcutaneous Q4H   pantoprazole (PROTONIX) IV  40 mg Intravenous QHS   Continuous Infusions:  lactated ringers 100 mL/hr at 11/05/18 0030   PRN Meds:.acetaminophen **OR** acetaminophen, albuterol, morphine injection, ondansetron **OR** ondansetron (ZOFRAN) IV, promethazine  Micro Results Recent Results (from the past 240 hour(s))  SARS CORONAVIRUS 2 (TAT 6-24 HRS) Nasopharyngeal Nasopharyngeal Swab     Status: None   Collection Time: 11/04/18  8:14  PM   Specimen: Nasopharyngeal Swab  Result Value Ref  Range Status   SARS Coronavirus 2 NEGATIVE NEGATIVE Final    Comment: (NOTE) SARS-CoV-2 target nucleic acids are NOT DETECTED. The SARS-CoV-2 RNA is generally detectable in upper and lower respiratory specimens during the acute phase of infection. Negative results do not preclude SARS-CoV-2 infection, do not rule out co-infections with other pathogens, and should not be used as the sole basis for treatment or other patient management decisions. Negative results must be combined with clinical observations, patient history, and epidemiological information. The expected result is Negative. Fact Sheet for Patients: SugarRoll.be Fact Sheet for Healthcare Providers: https://www.woods-mathews.com/ This test is not yet approved or cleared by the [place] FDA and  has been authorized for detection and/or diagnosis of SARS-CoV-2 by FDA under an Emergency Use Authorization (EUA). This EUA will remain  in effect (meaning this test can be used) for the duration of the COVID-19 declaration under Section 56 4(b)(1) of the Act, 21 U.S.C. section 360bbb-3(b)(1), unless the authorization is terminated or revoked sooner. Performed at [facility], [place] [address] [place], [place] [address]     Radiology Reports US Abdomen Complete  Result Date: 11/04/2018 CLINICAL DATA:  Nausea and vomiting. EXAM: ABDOMEN ULTRASOUND COMPLETE COMPARISON:  CT 10/04/2018 FINDINGS: Gallbladder: Surgically absent. Common bile duct: Diameter: 3 mm. Liver: Subtle liver lesions on prior CT are not defined sonographically. Within normal limits in parenchymal echogenicity. Portal vein is patent on color Doppler imaging with normal direction of blood flow towards the liver. IVC: No abnormality visualized. Pancreas: Visualized portion unremarkable. Spleen: Size and appearance within normal limits. Right Kidney: Length: 10.5 cm. Hydronephrosis on prior exam has resolved.  Echogenicity within normal limits. Left Kidney: Length: 10.5 cm. Echogenicity within normal limits. No mass or hydronephrosis visualized. Abdominal aorta: No aneurysm visualized. Other findings: None. IMPRESSION: 1. Post cholecystectomy without biliary dilatation. 2. Resolved right hydronephrosis from CT last month. 3. Liver lesions on prior CT are not defined sonographically. Electronically Signed   By: Keith Rake M.D.   On: 11/04/2018 21:33   US Renal  Result Date: 10/06/2018 CLINICAL DATA:  Assess for hydronephrosis EXAM: RENAL / URINARY TRACT ULTRASOUND COMPLETE COMPARISON:  None. FINDINGS: Right Kidney: Renal measurements: 10.3 x 5 x 5.3 cm = volume: 144 mL . Echogenicity within normal limits. No mass or hydronephrosis visualized. Left Kidney: Renal measurements: 10.4 x 4.6 x 4.5 cm = volume: 112 mL. Echogenicity within normal limits. No mass or hydronephrosis visualized. Bladder: Evaluation of the bladder is limited.  The bladder is empty. IMPRESSION: Normal bilateral kidneys.  No hydronephrosis bilaterally. Electronically Signed   By: Abelardo Diesel M.D.   On: 10/06/2018 12:25   Dg Abd Portable 1v  Result Date: 10/11/2018 CLINICAL DATA:  Nausea, vomiting EXAM: PORTABLE ABDOMEN - 1 VIEW COMPARISON:  11/17/2017 FINDINGS: Prior cholecystectomy. There is a non obstructive bowel gas pattern. No supine evidence of free air. No organomegaly or suspicious calcification. No acute bony abnormality. IMPRESSION: Negative. Electronically Signed   By: Rolm Baptise M.D.   On: 10/11/2018 19:55   [place] Ekg Site Rite  Result Date: 10/12/2018 If Site Rite image not attached, placement could not be confirmed due to current cardiac rhythm.   Time Spent in minutes  25   Contina Strain M.D on 11/05/2018 at 11:02 AM  Between 7am to 7pm - Pager - [phone]  After 7pm go to [email or web address] - password [facility]  [facility] -  Office  [phone]

## 2018-11-06 LAB — BASIC METABOLIC PANEL
Anion gap: 15 (ref 5–15)
BUN: 16 mg/dL (ref 6–20)
CO2: 17 mmol/L — ABNORMAL LOW (ref 22–32)
Calcium: 8.9 mg/dL (ref 8.9–10.3)
Chloride: 106 mmol/L (ref 98–111)
Creatinine, Ser: 0.75 mg/dL (ref 0.44–1.00)
GFR calc Af Amer: >60 mL/min (ref >60)
GFR calc non Af Amer: >60 mL/min (ref >60)
Glucose, Bld: 216 mg/dL — ABNORMAL HIGH (ref 70–99)
Potassium: 3.7 mmol/L (ref 3.5–5.1)
Sodium: 138 mmol/L (ref 135–145)

## 2018-11-06 LAB — GLUCOSE, CAPILLARY
Glucose-Capillary: 172 mg/dL — ABNORMAL HIGH (ref 70–99)
Glucose-Capillary: 198 mg/dL — ABNORMAL HIGH (ref 70–99)
Glucose-Capillary: 247 mg/dL — ABNORMAL HIGH (ref 70–99)
Glucose-Capillary: 261 mg/dL — ABNORMAL HIGH (ref 70–99)
Glucose-Capillary: 212 mg/dL — ABNORMAL HIGH (ref 70–99)

## 2018-11-06 LAB — HIV ANTIBODY (ROUTINE TESTING W REFLEX): HIV Screen 4th Generation wRfx: NONREACTIVE

## 2018-11-06 MED ORDER — LACTATED RINGERS IV SOLN
INTRAVENOUS | Status: DC
  Administered 2018-11-06 – 2018-11-07 (×2): via INTRAVENOUS

## 2018-11-06 MED ORDER — METOCLOPRAMIDE HCL 10 MG/10ML PO SOLN
10.0000 mg | Freq: Three times a day (TID) | ORAL | Status: DC
  Administered 2018-11-06 – 2018-11-07 (×4): 10 mg via ORAL
  Filled 2018-11-06 (×6): qty 10

## 2018-11-06 NOTE — Progress Notes (Signed)
PROGRESS NOTE                                                                                                                                                                                                             Patient Demographics:    [name], is a 28 y.o. female, DOB - 1990/09/05, UA:[id number]  Admit date - 11/04/2018   Admitting Physician Rise Patience, MD  Outpatient Primary MD for the patient is Vicenta Aly, [place]  LOS - 1   No chief complaint on file.      Brief Narrative 28 year old female with uncontrolled type 1 diabetes mellitus with multiple hospitalization for diabetic gastroparesis, recently admitted for pyelonephritis with obstructive uropathy presented to the ED with intractable nausea and vomiting with diffuse abdominal cramping for past few days. In the ED patient had persistent vomiting .  Admitted for severe diabetic gastroparesis.    Subjective:   Again had several episodes of vomiting overnight and complain of abdominal pain.  Unable to tolerate anything down.  Assessment  & Plan :   Principal problem   Gastroparesis due to DM (HCC) Intractable nausea and vomiting Persistent symptoms.  Continue n.p.o, IV hydration with Ringer's lactate.  Increase scheduled Reglan to 10 mg 3 times daily.  Monitor on telemetry.  Continue PRN Phenergan and Zofran. Monitor electrolytes and replenish.  Minimize narcotics for pain.   Active Problems:   Uncontrolled type 1 diabetes mellitus (HCC) Monitor CBG every 4 hours on sliding scale coverage.  Continue Lantus 10 units at bedtime  Essential hypertension Lisinopril held.  Placed on PRN hydralazine.   Code Status : Full code  Family Communication  : None  Disposition Plan  : Home once symptoms improve and tolerating diet, possibly in the next 48-72 hours  Barriers For Discharge : Active symptoms  Consults  : None  Procedures   : Renal ultrasound  DVT Prophylaxis  : Subcu Lovenox  Lab Results  Component Value Date   PLT 326 11/05/2018    Antibiotics  :  Anti-infectives (From admission, onward)   None        Objective:   Vitals:   11/06/18 0448 11/06/18 0525 11/06/18 0650 11/06/18 0837  BP: (!) 141/84 125/72 137/77 126/75  Pulse: (!) 127 (!) 120 (!) 118 (!) 128  Resp: 19 16  19  Temp: 98.9 F (37.2 C)   98.5 F (36.9 C)  TempSrc: Oral   Oral  SpO2: 100% 100% 100% 100%  Weight:      Height:        Wt Readings from Last 3 Encounters:  11/04/18 59 kg  10/06/18 61 kg  10/04/18 61.6 kg     Intake/Output Summary (Last 24 hours) at 11/06/2018 1114 Last data filed at 11/06/2018 0952 Gross per 24 hour  Intake 240 ml  Output 1502 ml  Net -1262 ml    Physical exam Young female, fatigued HEENT: Dry mucosa, supple neck Chest: Clear CVs: S1-S2 tachycardic, no murmurs GI: Soft, nondistended, moderate diffuse tenderness, bowel sounds present Musculoskeletal: Warm, no edema     Data Review:    CBC Recent Labs  Lab 11/04/18 1752 11/04/18 1759 11/05/18 0652  WBC 9.5  --  7.4  HGB 11.9* 12.6 12.7  HCT 37.2 37.0 41.4  PLT 487*  --  326  MCV 90.1  --  92.6  MCH 28.8  --  28.4  MCHC 32.0  --  30.7  RDW 13.6  --  13.5  LYMPHSABS 1.7  --   --   MONOABS 0.7  --   --   EOSABS 0.3  --   --   BASOSABS 0.0  --   --     Chemistries  Recent Labs  Lab 11/04/18 1752 11/04/18 1759 11/05/18 0652 11/06/18 0607  NA 140 141 144 138  K 3.6 3.7 3.7 3.7  CL 106 104 108 106  CO2 24  --  23 17*  GLUCOSE 191* 187* 93 216*  BUN 14 13 15 16   CREATININE 0.72 0.60 0.69 0.75  CALCIUM 9.4  --  9.7 8.9  AST 15  --  17  --   ALT 11  --  11  --   ALKPHOS 92  --  107  --   BILITOT 1.3*  --  0.8  --    ------------------------------------------------------------------------------------------------------------------ No results for input(s): CHOL, HDL, LDLCALC, TRIG, CHOLHDL, LDLDIRECT in the last  72 hours.  Lab Results  Component Value Date   HGBA1C 8.7 (H) 10/07/2018   ------------------------------------------------------------------------------------------------------------------ No results for input(s): TSH, T4TOTAL, T3FREE, THYROIDAB in the last 72 hours.  Invalid input(s): FREET3 ------------------------------------------------------------------------------------------------------------------ No results for input(s): VITAMINB12, FOLATE, FERRITIN, TIBC, IRON, RETICCTPCT in the last 72 hours.  Coagulation profile No results for input(s): INR, PROTIME in the last 168 hours.  No results for input(s): DDIMER in the last 72 hours.  Cardiac Enzymes No results for input(s): CKMB, TROPONINI, MYOGLOBIN in the last 168 hours.  Invalid input(s): CK ------------------------------------------------------------------------------------------------------------------    Component Value Date/Time   BNP 6.9 07/11/2015 0353    Inpatient Medications  Scheduled Meds: . enoxaparin (LOVENOX) injection  40 mg Subcutaneous Daily  . insulin aspart  0-9 Units Subcutaneous Q4H  . metoCLOPramide  10 mg Oral Q8H  . pantoprazole (PROTONIX) IV  40 mg Intravenous QHS   Continuous Infusions:  PRN Meds:.acetaminophen **OR** acetaminophen, albuterol, morphine injection, ondansetron **OR** ondansetron (ZOFRAN) IV, promethazine  Micro Results Recent Results (from the past 240 hour(s))  SARS CORONAVIRUS 2 (TAT 6-24 HRS) Nasopharyngeal Nasopharyngeal Swab     Status: None   Collection Time: 11/04/18  8:14 PM   Specimen: Nasopharyngeal Swab  Result Value Ref Range Status   SARS Coronavirus 2 NEGATIVE NEGATIVE Final    Comment: (NOTE) SARS-CoV-2 target nucleic acids are NOT DETECTED. The SARS-CoV-2 RNA is generally detectable in upper  and lower respiratory specimens during the acute phase of infection. Negative results do not preclude SARS-CoV-2 infection, do not rule out co-infections with  other pathogens, and should not be used as the sole basis for treatment or other patient management decisions. Negative results must be combined with clinical observations, patient history, and epidemiological information. The expected result is Negative. Fact Sheet for Patients: SugarRoll.be Fact Sheet for Healthcare Providers: https://www.woods-mathews.com/ This test is not yet approved or cleared by the [place] FDA and  has been authorized for detection and/or diagnosis of SARS-CoV-2 by FDA under an Emergency Use Authorization (EUA). This EUA will remain  in effect (meaning this test can be used) for the duration of the COVID-19 declaration under Section 56 4(b)(1) of the Act, 21 U.S.C. section 360bbb-3(b)(1), unless the authorization is terminated or revoked sooner. Performed at [facility], [place] [address] [place], [place] [address]     Radiology Reports US Abdomen Complete  Result Date: 11/04/2018 CLINICAL DATA:  Nausea and vomiting. EXAM: ABDOMEN ULTRASOUND COMPLETE COMPARISON:  CT 10/04/2018 FINDINGS: Gallbladder: Surgically absent. Common bile duct: Diameter: 3 mm. Liver: Subtle liver lesions on prior CT are not defined sonographically. Within normal limits in parenchymal echogenicity. Portal vein is patent on color Doppler imaging with normal direction of blood flow towards the liver. IVC: No abnormality visualized. Pancreas: Visualized portion unremarkable. Spleen: Size and appearance within normal limits. Right Kidney: Length: 10.5 cm. Hydronephrosis on prior exam has resolved. Echogenicity within normal limits. Left Kidney: Length: 10.5 cm. Echogenicity within normal limits. No mass or hydronephrosis visualized. Abdominal aorta: No aneurysm visualized. Other findings: None. IMPRESSION: 1. Post cholecystectomy without biliary dilatation. 2. Resolved right hydronephrosis from CT last month. 3. Liver lesions on prior CT are not  defined sonographically. Electronically Signed   By: Keith Rake M.D.   On: 11/04/2018 21:33   Dg Abd Portable 1v  Result Date: 10/11/2018 CLINICAL DATA:  Nausea, vomiting EXAM: PORTABLE ABDOMEN - 1 VIEW COMPARISON:  11/17/2017 FINDINGS: Prior cholecystectomy. There is a non obstructive bowel gas pattern. No supine evidence of free air. No organomegaly or suspicious calcification. No acute bony abnormality. IMPRESSION: Negative. Electronically Signed   By: Rolm Baptise M.D.   On: 10/11/2018 19:55   [place] Ekg Site Rite  Result Date: 10/12/2018 If Site Rite image not attached, placement could not be confirmed due to current cardiac rhythm.   Time Spent in minutes 35   Jamaia Brum M.D on 11/06/2018 at 11:14 AM  Between 7am to 7pm - Pager - [phone]  After 7pm go to [email or web address] - password [facility]  [facility] -  Office  [phone]

## 2018-11-06 NOTE — Progress Notes (Signed)
Received report from Pam at approximately 0415, pt had recently been given PRN morphine and PRN zofran.  PT 4am CBG 172, gave pt 2 units insulin per sliding scale, no complaints at that time.  Lab came to try to get AM ordered labs PT began to vomit.  PT had 300 mL out clear emesis that looked like straight water.  PT currently has a full liquid diet ordered and had 2 styrofoam cups nearly empty and 2 cans of unopened soda at bedside.  This nurse removed those liquids from the patients reach until which time the symptoms subside.  Pt medicated with PRN phenergan

## 2018-11-07 ENCOUNTER — Inpatient Hospital Stay (HOSPITAL_COMMUNITY): Payer: Medicaid Other

## 2018-11-07 ENCOUNTER — Inpatient Hospital Stay: Payer: Self-pay

## 2018-11-07 DIAGNOSIS — N390 Urinary tract infection, site not specified: Secondary | ICD-10-CM

## 2018-11-07 DIAGNOSIS — E872 Acidosis: Secondary | ICD-10-CM

## 2018-11-07 LAB — URINALYSIS, ROUTINE W REFLEX MICROSCOPIC
Bilirubin Urine: NEGATIVE
Glucose, UA: >=500 mg/dL — AB
Ketones, ur: 80 mg/dL — AB
Nitrite: NEGATIVE
Protein, ur: 100 mg/dL — AB
RBC / HPF: >50 RBC/hpf — ABNORMAL HIGH (ref 0–5)
Specific Gravity, Urine: 1.012 (ref 1.005–1.030)
WBC, UA: >50 WBC/hpf — ABNORMAL HIGH (ref 0–5)
pH: 6 (ref 5.0–8.0)

## 2018-11-07 LAB — BASIC METABOLIC PANEL
Anion gap: 15 (ref 5–15)
Anion gap: 18 — ABNORMAL HIGH (ref 5–15)
Anion gap: 16 — ABNORMAL HIGH (ref 5–15)
Anion gap: 16 — ABNORMAL HIGH (ref 5–15)
BUN: 16 mg/dL (ref 6–20)
BUN: 12 mg/dL (ref 6–20)
BUN: 10 mg/dL (ref 6–20)
BUN: 10 mg/dL (ref 6–20)
CO2: 11 mmol/L — ABNORMAL LOW (ref 22–32)
CO2: 12 mmol/L — ABNORMAL LOW (ref 22–32)
CO2: 8 mmol/L — ABNORMAL LOW (ref 22–32)
CO2: 8 mmol/L — ABNORMAL LOW (ref 22–32)
Calcium: 8.7 mg/dL — ABNORMAL LOW (ref 8.9–10.3)
Calcium: 9 mg/dL (ref 8.9–10.3)
Calcium: 8.5 mg/dL — ABNORMAL LOW (ref 8.9–10.3)
Calcium: 8.2 mg/dL — ABNORMAL LOW (ref 8.9–10.3)
Chloride: 106 mmol/L (ref 98–111)
Chloride: 105 mmol/L (ref 98–111)
Chloride: 110 mmol/L (ref 98–111)
Chloride: 110 mmol/L (ref 98–111)
Creatinine, Ser: 0.85 mg/dL (ref 0.44–1.00)
Creatinine, Ser: 0.92 mg/dL (ref 0.44–1.00)
Creatinine, Ser: 0.84 mg/dL (ref 0.44–1.00)
Creatinine, Ser: 0.93 mg/dL (ref 0.44–1.00)
GFR calc Af Amer: >60 mL/min (ref >60)
GFR calc Af Amer: >60 mL/min (ref >60)
GFR calc Af Amer: >60 mL/min (ref >60)
GFR calc Af Amer: >60 mL/min (ref >60)
GFR calc non Af Amer: >60 mL/min (ref >60)
GFR calc non Af Amer: >60 mL/min (ref >60)
GFR calc non Af Amer: >60 mL/min (ref >60)
GFR calc non Af Amer: >60 mL/min (ref >60)
Glucose, Bld: 248 mg/dL — ABNORMAL HIGH (ref 70–99)
Glucose, Bld: 232 mg/dL — ABNORMAL HIGH (ref 70–99)
Glucose, Bld: 236 mg/dL — ABNORMAL HIGH (ref 70–99)
Glucose, Bld: 265 mg/dL — ABNORMAL HIGH (ref 70–99)
Potassium: 4.1 mmol/L (ref 3.5–5.1)
Potassium: 4.7 mmol/L (ref 3.5–5.1)
Potassium: 4.5 mmol/L (ref 3.5–5.1)
Potassium: 3.9 mmol/L (ref 3.5–5.1)
Sodium: 132 mmol/L — ABNORMAL LOW (ref 135–145)
Sodium: 135 mmol/L (ref 135–145)
Sodium: 134 mmol/L — ABNORMAL LOW (ref 135–145)
Sodium: 134 mmol/L — ABNORMAL LOW (ref 135–145)

## 2018-11-07 LAB — RAPID URINE DRUG SCREEN, HOSP PERFORMED
Amphetamines: NOT DETECTED
Barbiturates: NOT DETECTED
Benzodiazepines: NOT DETECTED
Cocaine: NOT DETECTED
Opiates: POSITIVE — AB
Tetrahydrocannabinol: POSITIVE — AB

## 2018-11-07 LAB — GLUCOSE, CAPILLARY
Glucose-Capillary: 185 mg/dL — ABNORMAL HIGH (ref 70–99)
Glucose-Capillary: 192 mg/dL — ABNORMAL HIGH (ref 70–99)
Glucose-Capillary: 217 mg/dL — ABNORMAL HIGH (ref 70–99)
Glucose-Capillary: 222 mg/dL — ABNORMAL HIGH (ref 70–99)
Glucose-Capillary: 237 mg/dL — ABNORMAL HIGH (ref 70–99)
Glucose-Capillary: 247 mg/dL — ABNORMAL HIGH (ref 70–99)
Glucose-Capillary: 253 mg/dL — ABNORMAL HIGH (ref 70–99)
Glucose-Capillary: 271 mg/dL — ABNORMAL HIGH (ref 70–99)

## 2018-11-07 LAB — KETONES, URINE: Ketones, ur: 80 mg/dL — AB

## 2018-11-07 LAB — MRSA PCR SCREENING: MRSA by PCR: NEGATIVE

## 2018-11-07 MED ORDER — SODIUM CHLORIDE 0.9% FLUSH
10.0000 mL | Freq: Two times a day (BID) | INTRAVENOUS | Status: DC
  Administered 2018-11-07 – 2018-11-11 (×7): 10 mL

## 2018-11-07 MED ORDER — INSULIN REGULAR(HUMAN) IN NACL 100-0.9 UT/100ML-% IV SOLN
INTRAVENOUS | Status: AC
  Administered 2018-11-07: 2.1 [IU]/h via INTRAVENOUS
  Administered 2018-11-09: 3.2 [IU]/h via INTRAVENOUS
  Administered 2018-11-10: 4.1 [IU]/h via INTRAVENOUS
  Filled 2018-11-07 (×4): qty 100

## 2018-11-07 MED ORDER — SODIUM CHLORIDE 0.9 % IV SOLN
1.0000 g | INTRAVENOUS | Status: DC
  Administered 2018-11-07 – 2018-11-09 (×3): 1 g via INTRAVENOUS
  Filled 2018-11-07 (×3): qty 1

## 2018-11-07 MED ORDER — METOCLOPRAMIDE HCL 5 MG/ML IJ SOLN
10.0000 mg | Freq: Three times a day (TID) | INTRAMUSCULAR | Status: DC
  Administered 2018-11-07 – 2018-11-11 (×11): 10 mg via INTRAVENOUS
  Filled 2018-11-07 (×11): qty 2

## 2018-11-07 MED ORDER — CHLORHEXIDINE GLUCONATE CLOTH 2 % EX PADS
6.0000 | MEDICATED_PAD | Freq: Every day | CUTANEOUS | Status: DC
  Administered 2018-11-07 – 2018-11-11 (×5): 6 via TOPICAL

## 2018-11-07 MED ORDER — SODIUM CHLORIDE 0.9 % IV BOLUS
1000.0000 mL | Freq: Once | INTRAVENOUS | Status: AC
  Administered 2018-11-07: 1000 mL via INTRAVENOUS

## 2018-11-07 MED ORDER — SODIUM CHLORIDE 0.9% FLUSH: 10.0000 mL | INTRAVENOUS | Status: DC | PRN

## 2018-11-07 MED ORDER — SODIUM CHLORIDE 0.9 % IV SOLN
INTRAVENOUS | Status: DC
  Administered 2018-11-07: 23:00:00 via INTRAVENOUS

## 2018-11-07 MED ORDER — SODIUM CHLORIDE 0.9 % IV SOLN: INTRAVENOUS | Status: AC

## 2018-11-07 MED ORDER — DEXTROSE-NACL 5-0.45 % IV SOLN
INTRAVENOUS | Status: DC
  Administered 2018-11-07 – 2018-11-10 (×7): via INTRAVENOUS

## 2018-11-07 MED ORDER — SODIUM CHLORIDE 0.9 % IV SOLN
INTRAVENOUS | Status: AC
  Administered 2018-11-07: 23:00:00 via INTRAVENOUS

## 2018-11-07 MED ORDER — INSULIN GLARGINE 100 UNIT/ML ~~LOC~~ SOLN
15.0000 [IU] | Freq: Every day | SUBCUTANEOUS | Status: DC
  Administered 2018-11-07: 15 [IU] via SUBCUTANEOUS
  Filled 2018-11-07: qty 0.15

## 2018-11-07 MED ORDER — ENOXAPARIN SODIUM 40 MG/0.4ML ~~LOC~~ SOLN
40.0000 mg | SUBCUTANEOUS | Status: DC
  Administered 2018-11-08: 40 mg via SUBCUTANEOUS
  Filled 2018-11-07: qty 0.4

## 2018-11-07 NOTE — Progress Notes (Signed)
Patient retaining urine and feeling pressure again - was able to void 350 cc but bladder scan showing >999.  I&O cath done - resulting in 1000 cc.

## 2018-11-07 NOTE — TOC Progression Note (Signed)
Transition of Care [facility]) - Progression Note    Patient Details  Name: [name] MRN: [id number] Date of Birth: December 21, 1990  Transition of Care [facility]) CM/SW Contact  Purcell Mouton, RN Phone Number: 11/07/2018, 4:36 PM  Clinical Narrative:    Pt states there are no needs at present time when she discharge home. TOC will continue to follow for discharge needs.    Expected Discharge Plan: Home/Self Care    Expected Discharge Plan and Services Expected Discharge Plan: Home/Self Care       Living arrangements for the past 2 months: Single Family Home                                       Social Determinants of Health (SDOH) Interventions    Readmission Risk Interventions No flowsheet data found.

## 2018-11-07 NOTE — Progress Notes (Signed)
1000 cc cloudy yellow urine resulted from I&O cath.  Specimens sent

## 2018-11-07 NOTE — Progress Notes (Signed)
Peripherally Inserted Central Catheter/Midline Placement  The IV Nurse has discussed with the patient and/or persons authorized to consent for the patient, the purpose of this procedure and the potential benefits and risks involved with this procedure.  The benefits include less needle sticks, lab draws from the catheter, and the patient may be discharged home with the catheter. Risks include, but not limited to, infection, bleeding, blood clot (thrombus formation), and puncture of an artery; nerve damage and irregular heartbeat and possibility to perform a PICC exchange if needed/ordered by physician.  Alternatives to this procedure were also discussed.  Bard Power PICC patient education guide, fact sheet on infection prevention and patient information card has been provided to patient /or left at bedside.    PICC/Midline Placement Documentation  PICC Double Lumen 11/07/18 PICC Right Brachial 34 cm 0 cm (Active)  Indication for Insertion or Continuance of Line Limited venous access - need for IV therapy >5 days (PICC only);Poor Vasculature-patient has had multiple peripheral attempts or PIVs lasting less than 24 hours 11/07/18 2131  Exposed Catheter (cm) 0 cm 11/07/18 2131  Site Assessment Clean;Dry;Intact 11/07/18 2131  Lumen #1 Status Flushed;Saline locked;Blood return noted 11/07/18 2131  Lumen #2 Status Flushed;Saline locked;Blood return noted 11/07/18 2131  Dressing Type Transparent 11/07/18 2131  Dressing Status Clean;Dry;Intact;Antimicrobial disc in place 11/07/18 2131  Dressing Change Due 11/14/18 11/07/18 2131       Gordan Payment 11/07/2018, 9:32 PM

## 2018-11-07 NOTE — Progress Notes (Signed)
Patient complaining of pressure after urinating.  Initial bladder scan showing greater than 900cc.  Patient up to void - voided 250cc.  Post void bladder scan still showing 866cc.  Dr. Clementeen Graham made aware - will initiate I&O cath protocol and send UA and culture

## 2018-11-07 NOTE — Progress Notes (Signed)
Patient transferring to [place] - report given to [facility]

## 2018-11-07 NOTE — Progress Notes (Signed)
PROGRESS NOTE                                                                                                                                                                                                             Patient Demographics:    [name], is a 28 y.o. female, DOB - 1990-11-23, UA:[id number]  Admit date - 11/04/2018   Admitting Physician Rise Patience, MD  Outpatient Primary MD for the patient is Vicenta Aly, [place]  LOS - 2   No chief complaint on file.      Brief Narrative 28 year old female with uncontrolled type 1 diabetes mellitus with multiple hospitalization for diabetic gastroparesis, recently admitted for pyelonephritis with obstructive uropathy presented to the ED with intractable nausea and vomiting with diffuse abdominal cramping for past few days. In the ED patient had persistent vomiting .  Admitted for severe diabetic gastroparesis.    Subjective:   Continues to vomit.  Still having abdominal pain.  Tachycardic in the 110s-120s on the monitor.  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  Her a.m. lab shows low bicarb at 11 with anion gap of 15.  Patient did not received insulin as she was n.p.o.  I am concerned she may be going into DKA.  Ordered 15 units of Lantus.  Will check stat BMET and start her on aggressive IV hydration and insulin drip if needed. Continue IV fluids, scheduled Reglan 10 mg 3 times daily, empiric Phenergan and Zofran.   Active Problems:   Uncontrolled type 1 diabetes mellitus (HCC) Acute metabolic acidosis. Concern for going into DKA.  Repeat stat BMET and plan as above.  Will start her on IV normal saline bolus.  UTI Patient complaining of dysuria with cloudy urine.  UA suggestive of UTI.  Urine culture sent and placed on empiric IV  Rocephin.   Essential hypertension Lisinopril held.  Continue as needed hydralazine.    Code Status : Full code  Family Communication  : None  Disposition Plan  : Home once symptoms improve and tolerating diet, possibly in the next 48-72 hours  Barriers For Discharge : Active symptoms  Consults  : None  Procedures  : Renal ultrasound  DVT Prophylaxis  : Subcu Lovenox  Lab Results  Component Value Date   PLT 326 11/05/2018    Antibiotics  :  Anti-infectives (From admission, onward)   Start     Dose/Rate Route Frequency Ordered Stop   11/07/18 1030  cefTRIAXone (ROCEPHIN) 1 g in sodium chloride 0.9 % 100 mL IVPB     1 g 200 mL/hr over 30 Minutes Intravenous Every 24 hours 11/07/18 0924          Objective:   Vitals:   11/06/18 1621 11/06/18 1622 11/06/18 2055 11/07/18 0434  BP: (!) 142/104  (!) 149/90 131/80  Pulse: (!) 132 85  (!) 128  Resp: 19  19 20   Temp: 98.9 F (37.2 C)  98.6 F (37 C) 98.9 F (37.2 C)  TempSrc: Oral  Oral Oral  SpO2: 100%  100% 100%  Weight:      Height:        Wt Readings from Last 3 Encounters:  11/04/18 59 kg  10/06/18 61 kg  10/04/18 61.6 kg     Intake/Output Summary (Last 24 hours) at 11/07/2018 1311 Last data filed at 11/07/2018 [id number] Gross per 24 hour  Intake 2476.48 ml  Output 1550 ml  Net 926.48 ml   Physical exam Fatigued, in some distress with pain HEENT: Dry mucosa, supple neck Chest: Clear bilaterally CVs: S1-S2 tachycardic GI: Soft, nondistended, moderate tenderness, bowel sounds present Musculoskeletal: Warm, no edema     Data Review:    CBC Recent Labs  Lab 11/04/18 1752 11/04/18 1759 11/05/18 0652  WBC 9.5  --  7.4  HGB 11.9* 12.6 12.7  HCT 37.2 37.0 41.4  PLT 487*  --  326  MCV 90.1  --  92.6  MCH 28.8  --  28.4  MCHC 32.0  --  30.7  RDW 13.6  --  13.5  LYMPHSABS 1.7  --   --   MONOABS 0.7  --   --   EOSABS 0.3  --   --   BASOSABS 0.0  --   --     Chemistries  Recent Labs  Lab  11/04/18 1752 11/04/18 1759 11/05/18 0652 11/06/18 0607 11/07/18 0404  NA 140 141 144 138 132*  K 3.6 3.7 3.7 3.7 4.1  CL 106 104 108 106 106  CO2 24  --  23 17* 11*  GLUCOSE 191* 187* 93 216* 248*  BUN 14 13 15 16 16   CREATININE 0.72 0.60 0.69 0.75 0.85  CALCIUM 9.4  --  9.7 8.9 8.7*  AST 15  --  17  --   --   ALT 11  --  11  --   --   ALKPHOS 92  --  107  --   --   BILITOT 1.3*  --  0.8  --   --    ------------------------------------------------------------------------------------------------------------------ No results for input(s): CHOL, HDL, LDLCALC, TRIG, CHOLHDL, LDLDIRECT in the last 72 hours.  Lab Results  Component Value Date   HGBA1C 8.7 (H) 10/07/2018   ------------------------------------------------------------------------------------------------------------------ No results for input(s): TSH, T4TOTAL, T3FREE, THYROIDAB in the last 72 hours.  Invalid input(s): FREET3 ------------------------------------------------------------------------------------------------------------------ No results for input(s): VITAMINB12, FOLATE, FERRITIN, TIBC, IRON, RETICCTPCT in the last 72 hours.  Coagulation profile No results for input(s): INR, PROTIME in the last 168 hours.  No results for input(s): DDIMER in the last 72 hours.  Cardiac Enzymes No results for input(s): CKMB, TROPONINI, MYOGLOBIN in the last 168 hours.  Invalid input(s): CK ------------------------------------------------------------------------------------------------------------------    Component Value Date/Time  BNP 6.9 07/11/2015 0353    Inpatient Medications  Scheduled Meds: . enoxaparin (LOVENOX) injection  40 mg Subcutaneous Daily  . insulin aspart  0-9 Units Subcutaneous Q4H  . insulin glargine  15 Units Subcutaneous Daily  . metoCLOPramide  10 mg Oral Q8H  . pantoprazole (PROTONIX) IV  40 mg Intravenous QHS   Continuous Infusions: . cefTRIAXone (ROCEPHIN)  IV 1 g (11/07/18 1056)  .  lactated ringers 125 mL/hr at 11/07/18 1058   PRN Meds:.acetaminophen **OR** acetaminophen, albuterol, morphine injection, ondansetron **OR** ondansetron (ZOFRAN) IV, promethazine  Micro Results Recent Results (from the past 240 hour(s))  SARS CORONAVIRUS 2 (TAT 6-24 HRS) Nasopharyngeal Nasopharyngeal Swab     Status: None   Collection Time: 11/04/18  8:14 PM   Specimen: Nasopharyngeal Swab  Result Value Ref Range Status   SARS Coronavirus 2 NEGATIVE NEGATIVE Final    Comment: (NOTE) SARS-CoV-2 target nucleic acids are NOT DETECTED. The SARS-CoV-2 RNA is generally detectable in upper and lower respiratory specimens during the acute phase of infection. Negative results do not preclude SARS-CoV-2 infection, do not rule out co-infections with other pathogens, and should not be used as the sole basis for treatment or other patient management decisions. Negative results must be combined with clinical observations, patient history, and epidemiological information. The expected result is Negative. Fact Sheet for Patients: SugarRoll.be Fact Sheet for Healthcare Providers: https://www.woods-mathews.com/ This test is not yet approved or cleared by the [place] FDA and  has been authorized for detection and/or diagnosis of SARS-CoV-2 by FDA under an Emergency Use Authorization (EUA). This EUA will remain  in effect (meaning this test can be used) for the duration of the COVID-19 declaration under Section 56 4(b)(1) of the Act, 21 U.S.C. section 360bbb-3(b)(1), unless the authorization is terminated or revoked sooner. Performed at [facility], [place] [address], [place], [place] [address]     Radiology Reports US Abdomen Complete  Result Date: 11/04/2018 CLINICAL DATA:  Nausea and vomiting. EXAM: ABDOMEN ULTRASOUND COMPLETE COMPARISON:  CT 10/04/2018 FINDINGS: Gallbladder: Surgically absent. Common bile duct: Diameter: 3 mm. Liver: Subtle  liver lesions on prior CT are not defined sonographically. Within normal limits in parenchymal echogenicity. Portal vein is patent on color Doppler imaging with normal direction of blood flow towards the liver. IVC: No abnormality visualized. Pancreas: Visualized portion unremarkable. Spleen: Size and appearance within normal limits. Right Kidney: Length: 10.5 cm. Hydronephrosis on prior exam has resolved. Echogenicity within normal limits. Left Kidney: Length: 10.5 cm. Echogenicity within normal limits. No mass or hydronephrosis visualized. Abdominal aorta: No aneurysm visualized. Other findings: None. IMPRESSION: 1. Post cholecystectomy without biliary dilatation. 2. Resolved right hydronephrosis from CT last month. 3. Liver lesions on prior CT are not defined sonographically. Electronically Signed   By: Keith Rake M.D.   On: 11/04/2018 21:33   Dg Abd Portable 1v  Result Date: 10/11/2018 CLINICAL DATA:  Nausea, vomiting EXAM: PORTABLE ABDOMEN - 1 VIEW COMPARISON:  11/17/2017 FINDINGS: Prior cholecystectomy. There is a non obstructive bowel gas pattern. No supine evidence of free air. No organomegaly or suspicious calcification. No acute bony abnormality. IMPRESSION: Negative. Electronically Signed   By: Rolm Baptise M.D.   On: 10/11/2018 19:55   [place] Ekg Site Rite  Result Date: 10/12/2018 If Site Rite image not attached, placement could not be confirmed due to current cardiac rhythm.   Time Spent in minutes 35   Shallen Luedke M.D on 11/07/2018 at 1:11 PM  Between 7am to 7pm -  Pager - [phone]  After 7pm go to [email or web address] - password [facility]  [facility] -  Office  [phone]

## 2018-11-07 NOTE — Progress Notes (Signed)
Patient has  metabolic acidosis with AG of 18  Suggestive of DKA. Glucose stabilizer protocol initiated with IV NS bolus ( ordered 3rd liter) followed by NS@150  CC/ hr. Start on insulin drip. Monitor BMET Q4 hrs until AG closed.  Continue tele monitoring

## 2018-11-07 NOTE — Progress Notes (Signed)
Admitted pt to the unit at 2000. Awaiting IV access from IV team to begin insulin gtt. Pt still c/o nausea and vomiting; unable to give antiemetic d/t lack of access. Will continue to monitor pt.

## 2018-11-08 ENCOUNTER — Inpatient Hospital Stay (HOSPITAL_COMMUNITY): Payer: Medicaid Other

## 2018-11-08 DIAGNOSIS — E1010 Type 1 diabetes mellitus with ketoacidosis without coma: Principal | ICD-10-CM

## 2018-11-08 DIAGNOSIS — I517 Cardiomegaly: Secondary | ICD-10-CM

## 2018-11-08 LAB — BASIC METABOLIC PANEL
Anion gap: 13 (ref 5–15)
Anion gap: 10 (ref 5–15)
Anion gap: 10 (ref 5–15)
Anion gap: 10 (ref 5–15)
Anion gap: 10 (ref 5–15)
Anion gap: 10 (ref 5–15)
BUN: 10 mg/dL (ref 6–20)
BUN: 11 mg/dL (ref 6–20)
BUN: 10 mg/dL (ref 6–20)
BUN: 7 mg/dL (ref 6–20)
BUN: 5 mg/dL — ABNORMAL LOW (ref 6–20)
BUN: <5 mg/dL — ABNORMAL LOW (ref 6–20)
CO2: 10 mmol/L — ABNORMAL LOW (ref 22–32)
CO2: 13 mmol/L — ABNORMAL LOW (ref 22–32)
CO2: 14 mmol/L — ABNORMAL LOW (ref 22–32)
CO2: 15 mmol/L — ABNORMAL LOW (ref 22–32)
CO2: 15 mmol/L — ABNORMAL LOW (ref 22–32)
CO2: 17 mmol/L — ABNORMAL LOW (ref 22–32)
Calcium: 8.5 mg/dL — ABNORMAL LOW (ref 8.9–10.3)
Calcium: 8.5 mg/dL — ABNORMAL LOW (ref 8.9–10.3)
Calcium: 8.4 mg/dL — ABNORMAL LOW (ref 8.9–10.3)
Calcium: 8.2 mg/dL — ABNORMAL LOW (ref 8.9–10.3)
Calcium: 7.7 mg/dL — ABNORMAL LOW (ref 8.9–10.3)
Calcium: 7.8 mg/dL — ABNORMAL LOW (ref 8.9–10.3)
Chloride: 114 mmol/L — ABNORMAL HIGH (ref 98–111)
Chloride: 113 mmol/L — ABNORMAL HIGH (ref 98–111)
Chloride: 111 mmol/L (ref 98–111)
Chloride: 109 mmol/L (ref 98–111)
Chloride: 109 mmol/L (ref 98–111)
Chloride: 108 mmol/L (ref 98–111)
Creatinine, Ser: 0.73 mg/dL (ref 0.44–1.00)
Creatinine, Ser: 0.74 mg/dL (ref 0.44–1.00)
Creatinine, Ser: 0.79 mg/dL (ref 0.44–1.00)
Creatinine, Ser: 0.61 mg/dL (ref 0.44–1.00)
Creatinine, Ser: 0.61 mg/dL (ref 0.44–1.00)
Creatinine, Ser: 0.49 mg/dL (ref 0.44–1.00)
GFR calc Af Amer: >60 mL/min (ref >60)
GFR calc Af Amer: >60 mL/min (ref >60)
GFR calc Af Amer: >60 mL/min (ref >60)
GFR calc Af Amer: >60 mL/min (ref >60)
GFR calc Af Amer: >60 mL/min (ref >60)
GFR calc Af Amer: >60 mL/min (ref >60)
GFR calc non Af Amer: >60 mL/min (ref >60)
GFR calc non Af Amer: >60 mL/min (ref >60)
GFR calc non Af Amer: >60 mL/min (ref >60)
GFR calc non Af Amer: >60 mL/min (ref >60)
GFR calc non Af Amer: >60 mL/min (ref >60)
GFR calc non Af Amer: >60 mL/min (ref >60)
Glucose, Bld: 193 mg/dL — ABNORMAL HIGH (ref 70–99)
Glucose, Bld: 158 mg/dL — ABNORMAL HIGH (ref 70–99)
Glucose, Bld: 162 mg/dL — ABNORMAL HIGH (ref 70–99)
Glucose, Bld: 176 mg/dL — ABNORMAL HIGH (ref 70–99)
Glucose, Bld: 218 mg/dL — ABNORMAL HIGH (ref 70–99)
Glucose, Bld: 192 mg/dL — ABNORMAL HIGH (ref 70–99)
Potassium: 3.8 mmol/L (ref 3.5–5.1)
Potassium: 3.6 mmol/L (ref 3.5–5.1)
Potassium: 3.5 mmol/L (ref 3.5–5.1)
Potassium: 3.9 mmol/L (ref 3.5–5.1)
Potassium: 3.7 mmol/L (ref 3.5–5.1)
Potassium: 3.1 mmol/L — ABNORMAL LOW (ref 3.5–5.1)
Sodium: 137 mmol/L (ref 135–145)
Sodium: 136 mmol/L (ref 135–145)
Sodium: 135 mmol/L (ref 135–145)
Sodium: 134 mmol/L — ABNORMAL LOW (ref 135–145)
Sodium: 134 mmol/L — ABNORMAL LOW (ref 135–145)
Sodium: 135 mmol/L (ref 135–145)

## 2018-11-08 LAB — ECHOCARDIOGRAM COMPLETE
Height: 63 in
Weight: 2123.47 oz

## 2018-11-08 LAB — GLUCOSE, CAPILLARY
Glucose-Capillary: 134 mg/dL — ABNORMAL HIGH (ref 70–99)
Glucose-Capillary: 134 mg/dL — ABNORMAL HIGH (ref 70–99)
Glucose-Capillary: 135 mg/dL — ABNORMAL HIGH (ref 70–99)
Glucose-Capillary: 141 mg/dL — ABNORMAL HIGH (ref 70–99)
Glucose-Capillary: 148 mg/dL — ABNORMAL HIGH (ref 70–99)
Glucose-Capillary: 148 mg/dL — ABNORMAL HIGH (ref 70–99)
Glucose-Capillary: 150 mg/dL — ABNORMAL HIGH (ref 70–99)
Glucose-Capillary: 150 mg/dL — ABNORMAL HIGH (ref 70–99)
Glucose-Capillary: 152 mg/dL — ABNORMAL HIGH (ref 70–99)
Glucose-Capillary: 153 mg/dL — ABNORMAL HIGH (ref 70–99)
Glucose-Capillary: 153 mg/dL — ABNORMAL HIGH (ref 70–99)
Glucose-Capillary: 157 mg/dL — ABNORMAL HIGH (ref 70–99)
Glucose-Capillary: 159 mg/dL — ABNORMAL HIGH (ref 70–99)
Glucose-Capillary: 162 mg/dL — ABNORMAL HIGH (ref 70–99)
Glucose-Capillary: 163 mg/dL — ABNORMAL HIGH (ref 70–99)
Glucose-Capillary: 170 mg/dL — ABNORMAL HIGH (ref 70–99)
Glucose-Capillary: 171 mg/dL — ABNORMAL HIGH (ref 70–99)
Glucose-Capillary: 171 mg/dL — ABNORMAL HIGH (ref 70–99)
Glucose-Capillary: 177 mg/dL — ABNORMAL HIGH (ref 70–99)
Glucose-Capillary: 196 mg/dL — ABNORMAL HIGH (ref 70–99)
Glucose-Capillary: 198 mg/dL — ABNORMAL HIGH (ref 70–99)
Glucose-Capillary: 201 mg/dL — ABNORMAL HIGH (ref 70–99)
Glucose-Capillary: 216 mg/dL — ABNORMAL HIGH (ref 70–99)
Glucose-Capillary: 225 mg/dL — ABNORMAL HIGH (ref 70–99)

## 2018-11-08 LAB — URINE CULTURE: Culture: 40000 — AB

## 2018-11-08 LAB — TSH: TSH: 1.208 u[IU]/mL (ref 0.350–4.500)

## 2018-11-08 LAB — MAGNESIUM: Magnesium: 1.8 mg/dL (ref 1.7–2.4)

## 2018-11-08 MED ORDER — MAGNESIUM SULFATE 2 GM/50ML IV SOLN
2.0000 g | Freq: Once | INTRAVENOUS | Status: AC
  Administered 2018-11-08: 2 g via INTRAVENOUS
  Filled 2018-11-08: qty 50

## 2018-11-08 MED ORDER — SODIUM CHLORIDE 0.9 % IV BOLUS
2000.0000 mL | Freq: Once | INTRAVENOUS | Status: AC
  Administered 2018-11-08: 2000 mL via INTRAVENOUS

## 2018-11-08 MED ORDER — SODIUM CHLORIDE 0.9 % IV BOLUS
1000.0000 mL | Freq: Once | INTRAVENOUS | Status: AC
  Administered 2018-11-08: 1000 mL via INTRAVENOUS

## 2018-11-08 MED ORDER — POTASSIUM CHLORIDE 10 MEQ/100ML IV SOLN
10.0000 meq | INTRAVENOUS | Status: AC
  Administered 2018-11-08 (×4): 10 meq via INTRAVENOUS
  Filled 2018-11-08 (×4): qty 100

## 2018-11-08 NOTE — Progress Notes (Addendum)
PROGRESS NOTE                                                                                                                                                                                                             Patient Demographics:    [name], is a 28 y.o. female, DOB - 04/11/1990, UA:[id number]  Admit date - 11/04/2018   Admitting Physician Rise Patience, MD  Outpatient Primary MD for the patient is Vicenta Aly, [place]  LOS - 3   No chief complaint on file.      Brief Narrative 28 year old female with uncontrolled type 1 diabetes mellitus with multiple hospitalization for diabetic gastroparesis, recently admitted for pyelonephritis with obstructive uropathy presented to the ED with intractable nausea and vomiting with diffuse abdominal cramping for past few days. In the ED patient had persistent vomiting .  Admitted for severe diabetic gastroparesis.    Subjective:   Still nauseous and vomiting off and on.  Was transferred to stepdown unit last evening went into DKA with significantly low bicarb.  PICC line placed for poor IV access.  Noted to have urinary retention since yesterday along with dysuria.  Assessment  & Plan :   Principal problem   Gastroparesis due to DM (HCC) Intractable nausea and vomiting Persistent symptoms.  Remains n.p.o.  Getting aggressive IV hydration, scheduled Reglan, as needed Phenergan and Zofran.  Serial abdominal exam. Multiple ED visits and hospitalization for the same.   Active Problems:   Uncontrolled type 1 diabetes mellitus with DKA ([place]) On 10/19 patient found to be in DKA with significant low bicarb, anion gap acidosis with elevated ketones. Started on IV fluid bolus (received 3 L followed by IV normal saline), insulin drip and transferred to stepdown unit.  Anion gap closed but bicarb still at 14.  CBG stable.  Continue insulin drip with D5 half NS.   Monitor BMET q4 hrs, replace k and mg. Still tachycardic on the monitor.  Sinus tachycardia Suspect secondary to dehydration and DKA.  However this has been persistent since admission.  EKG showed sinus tachycardia with LVH.  Check TSH and 2D echo.  UTI with acute urinary retention Patient complaining of dysuria with cloudy urine.  UA suggestive of UTI.  Urine culture sent and started on empiric Rocephin.  Noted for significant postvoid residual since yesterday.  Renal ultrasound showed thickened bladder suggestive of cystitis with moderate right-sided hydronephrosis.  Foley placed.  Monitor strict I's/O.  Hypokalemia/hypomagnesium Replenished  Essential hypertension Lisinopril on hold as patient n.p.o.  Continue as needed hydralazine.     Code Status : Full code  Family Communication  : None  Disposition Plan  : Home once symptoms improved.  Barriers For Discharge : Active symptoms  Consults  : None  Procedures  : Renal ultrasound, 2D echo  DVT Prophylaxis  : Subcu Lovenox  Lab Results  Component Value Date   PLT 326 11/05/2018    Antibiotics  :  Anti-infectives (From admission, onward)   Start     Dose/Rate Route Frequency Ordered Stop   11/07/18 1030  cefTRIAXone (ROCEPHIN) 1 g in sodium chloride 0.9 % 100 mL IVPB     1 g 200 mL/hr over 30 Minutes Intravenous Every 24 hours 11/07/18 0924          Objective:   Vitals:   11/08/18 0900 11/08/18 1000 11/08/18 1100 11/08/18 1129  BP: 129/84 (!) 156/107 134/87   Pulse: (!) 118 (!) 127  (!) 122  Resp: (!) 21 15 (!) 21 17  Temp:    98.3 F (36.8 C)  TempSrc:      SpO2: 99% 100%  98%  Weight:      Height:        Wt Readings from Last 3 Encounters:  11/07/18 60.2 kg  10/06/18 61 kg  10/04/18 61.6 kg     Intake/Output Summary (Last 24 hours) at 11/08/2018 1247 Last data filed at 11/08/2018 0428 Gross per 24 hour  Intake 3254.2 ml  Output 2300 ml  Net 954.2 ml   Physical exam Middle-aged female  appears fatigued, not in distress HEENT: Dry mucosa, supple neck Chest: Clear to auscultation bilaterally CVs: S1-S2 tachycardic, no murmurs GI: Soft, nondistended, bowel sound present, mild tenderness diffusely Musculoskeletal: Warm, no edema   Data Review:    CBC Recent Labs  Lab 11/04/18 1752 11/04/18 1759 11/05/18 0652  WBC 9.5  --  7.4  HGB 11.9* 12.6 12.7  HCT 37.2 37.0 41.4  PLT 487*  --  326  MCV 90.1  --  92.6  MCH 28.8  --  28.4  MCHC 32.0  --  30.7  RDW 13.6  --  13.5  LYMPHSABS 1.7  --   --   MONOABS 0.7  --   --   EOSABS 0.3  --   --   BASOSABS 0.0  --   --     Chemistries  Recent Labs  Lab 11/04/18 1752  11/05/18 0652  11/07/18 1848 11/07/18 2208 11/08/18 0206 11/08/18 0558 11/08/18 1018  NA 140   < > 144   < > 134* 134* 137 136 135  K 3.6   < > 3.7   < > 4.5 3.9 3.8 3.6 3.5  CL 106   < > 108   < > 110 110 114* 113* 111  CO2 24  --  23   < > 8* 8* 10* 13* 14*  GLUCOSE 191*   < > 93   < > 236* 265* 193* 158* 162*  BUN 14   < > 15   < > 10 10 10 11 10   CREATININE 0.72   < > 0.69   < > 0.84 0.93 0.73 0.74 0.79  CALCIUM 9.4  --  9.7   < > 8.5* 8.2* 8.5* 8.5* 8.4*  MG  --   --   --   --   --   --   --   --  1.8  AST 15  --  17  --   --   --   --   --   --   ALT 11  --  11  --   --   --   --   --   --   ALKPHOS 92  --  107  --   --   --   --   --   --   BILITOT 1.3*  --  0.8  --   --   --   --   --   --    < > = values in this interval not displayed.   ------------------------------------------------------------------------------------------------------------------ No results for input(s): CHOL, HDL, LDLCALC, TRIG, CHOLHDL, LDLDIRECT in the last 72 hours.  Lab Results  Component Value Date   HGBA1C 8.7 (H) 10/07/2018   ------------------------------------------------------------------------------------------------------------------ No results for input(s): TSH, T4TOTAL, T3FREE, THYROIDAB in the last 72 hours.  Invalid input(s):  FREET3 ------------------------------------------------------------------------------------------------------------------ No results for input(s): VITAMINB12, FOLATE, FERRITIN, TIBC, IRON, RETICCTPCT in the last 72 hours.  Coagulation profile No results for input(s): INR, PROTIME in the last 168 hours.  No results for input(s): DDIMER in the last 72 hours.  Cardiac Enzymes No results for input(s): CKMB, TROPONINI, MYOGLOBIN in the last 168 hours.  Invalid input(s): CK ------------------------------------------------------------------------------------------------------------------    Component Value Date/Time   BNP 6.9 07/11/2015 0353    Inpatient Medications  Scheduled Meds:  Chlorhexidine Gluconate Cloth  6 each Topical Daily   enoxaparin (LOVENOX) injection  40 mg Subcutaneous Q24H   metoCLOPramide (REGLAN) injection  10 mg Intravenous Q8H   pantoprazole (PROTONIX) IV  40 mg Intravenous QHS   sodium chloride flush  10-40 mL Intracatheter Q12H   Continuous Infusions:  sodium chloride Stopped (11/07/18 2357)   cefTRIAXone (ROCEPHIN)  IV 1 g (11/08/18 1130)   dextrose 5 % and 0.45% NaCl 125 mL/hr at 11/08/18 1128   insulin 4.1 mL/hr at 11/08/18 0400   magnesium sulfate bolus IVPB     potassium chloride 10 mEq (11/08/18 1128)   PRN Meds:.acetaminophen **OR** acetaminophen, albuterol, morphine injection, ondansetron **OR** ondansetron (ZOFRAN) IV, promethazine, sodium chloride flush  Micro Results Recent Results (from the past 240 hour(s))  SARS CORONAVIRUS 2 (TAT 6-24 HRS) Nasopharyngeal Nasopharyngeal Swab     Status: None   Collection Time: 11/04/18  8:14 PM   Specimen: Nasopharyngeal Swab  Result Value Ref Range Status   SARS Coronavirus 2 NEGATIVE NEGATIVE Final    Comment: (NOTE) SARS-CoV-2 target nucleic acids are NOT DETECTED. The SARS-CoV-2 RNA is generally detectable in upper and lower respiratory specimens during the acute phase of infection.  Negative results do not preclude SARS-CoV-2 infection, do not rule out co-infections with other pathogens, and should not be used as the sole basis for treatment or other patient management decisions. Negative results must be combined with clinical observations, patient history, and epidemiological information. The expected result is Negative. Fact Sheet for Patients: SugarRoll.be Fact Sheet for Healthcare Providers: https://www.woods-mathews.com/ This test is not yet approved or cleared by the [place] FDA and  has been authorized for detection and/or diagnosis of SARS-CoV-2 by FDA under an Emergency Use Authorization (EUA). This EUA will remain  in effect (meaning this test can be used) for the duration of the COVID-19 declaration under Section 56 4(b)(1) of the Act, 21 U.S.C. section 360bbb-3(b)(1), unless the authorization is terminated or revoked sooner. Performed at [facility], [place] [address], [place], [place] [address]   MRSA  PCR Screening     Status: None   Collection Time: 11/07/18  7:56 PM   Specimen: Nasal Mucosa; Nasopharyngeal  Result Value Ref Range Status   MRSA by PCR NEGATIVE NEGATIVE Final    Comment:        The GeneXpert MRSA Assay (FDA approved for NASAL specimens only), is one component of a comprehensive MRSA colonization surveillance program. It is not intended to diagnose MRSA infection nor to guide or monitor treatment for MRSA infections. Performed at [facility], [place] [address], [place], [place] [address]     Radiology Reports US Abdomen Complete  Result Date: 11/04/2018 CLINICAL DATA:  Nausea and vomiting. EXAM: ABDOMEN ULTRASOUND COMPLETE COMPARISON:  CT 10/04/2018 FINDINGS: Gallbladder: Surgically absent. Common bile duct: Diameter: 3 mm. Liver: Subtle liver lesions on prior CT are not defined sonographically. Within normal limits in parenchymal echogenicity. Portal vein  is patent on color Doppler imaging with normal direction of blood flow towards the liver. IVC: No abnormality visualized. Pancreas: Visualized portion unremarkable. Spleen: Size and appearance within normal limits. Right Kidney: Length: 10.5 cm. Hydronephrosis on prior exam has resolved. Echogenicity within normal limits. Left Kidney: Length: 10.5 cm. Echogenicity within normal limits. No mass or hydronephrosis visualized. Abdominal aorta: No aneurysm visualized. Other findings: None. IMPRESSION: 1. Post cholecystectomy without biliary dilatation. 2. Resolved right hydronephrosis from CT last month. 3. Liver lesions on prior CT are not defined sonographically. Electronically Signed   By: Keith Rake M.D.   On: 11/04/2018 21:33   US Renal  Result Date: 11/07/2018 CLINICAL DATA:  Urinary retention, RIGHT-side pain, history type I diabetes mellitus, hypertension EXAM: RENAL / URINARY TRACT ULTRASOUND COMPLETE COMPARISON:  Renal ultrasound 10/06/2018 FINDINGS: Right Kidney: Renal measurements: 11.7 x 5.1 x 5.0 cm = volume: 156 mL. Normal cortical thickness. Increased cortical echogenicity. Mild RIGHT hydronephrosis. Mild thickening of the wall of the renal pelvis, 3 mm thick. No renal mass or shadowing calcification. Left Kidney: Renal measurements: 10.0 x 5.3 x 4.4 cm = volume: 125 mL. Normal cortical thickness. Increased cortical echogenicity. No mass, hydronephrosis, or shadowing calcification. Bladder: Bladder wall appears diffusely thickened and irregular. Scattered debris within bladder. No focal bladder mass. Other: N/A IMPRESSION: Diffuse bladder wall thickening and irregularity, associated with significant debris within bladder, question cystitis; recommend correlation with urinalysis. Mild RIGHT hydronephrosis with thickening of the wall of the RIGHT renal pelvis, can be seen with pyelonephritis/chronic infection. Medical renal disease changes of both kidneys, suspect related to history of type I  diabetes mellitus. Electronically Signed   By: Lavonia Dana M.D.   On: 11/07/2018 18:09   Dg Abd Portable 1v  Result Date: 10/11/2018 CLINICAL DATA:  Nausea, vomiting EXAM: PORTABLE ABDOMEN - 1 VIEW COMPARISON:  11/17/2017 FINDINGS: Prior cholecystectomy. There is a non obstructive bowel gas pattern. No supine evidence of free air. No organomegaly or suspicious calcification. No acute bony abnormality. IMPRESSION: Negative. Electronically Signed   By: Rolm Baptise M.D.   On: 10/11/2018 19:55   [place] Ekg Site Rite  Result Date: 11/07/2018 If Site Rite image not attached, placement could not be confirmed due to current cardiac rhythm.  [place] Ekg Site Rite  Result Date: 10/12/2018 If [facility] image not attached, placement could not be confirmed due to current cardiac rhythm.   Time Spent in minutes 35   Marcquis Ridlon M.D on 11/08/2018 at 12:47 PM  Between 7am to 7pm - Pager - [phone]  After 7pm go to [email or web address] - password [facility]  [facility] -  Office  [phone]

## 2018-11-08 NOTE — Progress Notes (Signed)
Echocardiogram 2D Echocardiogram has been performed.  Oneal Deputy Mailey Landstrom 11/08/2018, 2:05 PM

## 2018-11-08 NOTE — TOC Progression Note (Signed)
Transition of Care [facility]rol) - Progression Note    Patient Details  Name: [name] MRN: [id number] Date of Birth: May 06, 1990  Transition of Care [facility] CM/SW [place], [place] Phone Number: 11/08/2018, 12:07 PM  Clinical Narrative:   Met with patient to gain more information due to high risk of re-addmission. She states she lives with her mother, her brother and her 13 year old daughter. They have internet access for her daughter to attend school virtually.  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 She has transportation, sees Vicenta Aly at [facility] for PCP needs, and states her medications are affordable.  TOC will continue to follow during the course of hospitalization.    Expected Discharge Plan: Home/Self Care    Expected Discharge Plan and Services Expected Discharge Plan: Home/Self Care       Living arrangements for the past 2 months: Single Family Home                                       Social Determinants of Health (SDOH) Interventions    Readmission Risk Interventions No flowsheet data found.

## 2018-11-08 NOTE — Progress Notes (Signed)
Patient ambulated to bathroom to void. States that she feels "pressure" after voiding. Bladder scan reading 898. MD notified. Order for foley placed. 77F foley placed using sterile technique. 1L of cloud, yellow urine immediately returned. Will continue to monitor at this time

## 2018-11-09 DIAGNOSIS — N139 Obstructive and reflux uropathy, unspecified: Secondary | ICD-10-CM

## 2018-11-09 DIAGNOSIS — B3749 Other urogenital candidiasis: Secondary | ICD-10-CM

## 2018-11-09 DIAGNOSIS — N1330 Unspecified hydronephrosis: Secondary | ICD-10-CM

## 2018-11-09 DIAGNOSIS — N10 Acute tubulo-interstitial nephritis: Secondary | ICD-10-CM

## 2018-11-09 LAB — GLUCOSE, CAPILLARY
Glucose-Capillary: 103 mg/dL — ABNORMAL HIGH (ref 70–99)
Glucose-Capillary: 115 mg/dL — ABNORMAL HIGH (ref 70–99)
Glucose-Capillary: 132 mg/dL — ABNORMAL HIGH (ref 70–99)
Glucose-Capillary: 137 mg/dL — ABNORMAL HIGH (ref 70–99)
Glucose-Capillary: 141 mg/dL — ABNORMAL HIGH (ref 70–99)
Glucose-Capillary: 149 mg/dL — ABNORMAL HIGH (ref 70–99)
Glucose-Capillary: 151 mg/dL — ABNORMAL HIGH (ref 70–99)
Glucose-Capillary: 155 mg/dL — ABNORMAL HIGH (ref 70–99)
Glucose-Capillary: 156 mg/dL — ABNORMAL HIGH (ref 70–99)
Glucose-Capillary: 156 mg/dL — ABNORMAL HIGH (ref 70–99)
Glucose-Capillary: 159 mg/dL — ABNORMAL HIGH (ref 70–99)
Glucose-Capillary: 160 mg/dL — ABNORMAL HIGH (ref 70–99)
Glucose-Capillary: 164 mg/dL — ABNORMAL HIGH (ref 70–99)
Glucose-Capillary: 165 mg/dL — ABNORMAL HIGH (ref 70–99)
Glucose-Capillary: 167 mg/dL — ABNORMAL HIGH (ref 70–99)
Glucose-Capillary: 169 mg/dL — ABNORMAL HIGH (ref 70–99)
Glucose-Capillary: 173 mg/dL — ABNORMAL HIGH (ref 70–99)
Glucose-Capillary: 184 mg/dL — ABNORMAL HIGH (ref 70–99)
Glucose-Capillary: 184 mg/dL — ABNORMAL HIGH (ref 70–99)
Glucose-Capillary: 193 mg/dL — ABNORMAL HIGH (ref 70–99)
Glucose-Capillary: 194 mg/dL — ABNORMAL HIGH (ref 70–99)

## 2018-11-09 LAB — MAGNESIUM
Magnesium: 1.6 mg/dL — ABNORMAL LOW (ref 1.7–2.4)
Magnesium: 1.5 mg/dL — ABNORMAL LOW (ref 1.7–2.4)
Magnesium: 1.2 mg/dL — ABNORMAL LOW (ref 1.7–2.4)

## 2018-11-09 LAB — BASIC METABOLIC PANEL
Anion gap: 5 (ref 5–15)
Anion gap: 10 (ref 5–15)
Anion gap: 7 (ref 5–15)
Anion gap: 8 (ref 5–15)
Anion gap: 6 (ref 5–15)
Anion gap: 5 (ref 5–15)
BUN: <5 mg/dL — ABNORMAL LOW (ref 6–20)
BUN: <5 mg/dL — ABNORMAL LOW (ref 6–20)
BUN: <5 mg/dL — ABNORMAL LOW (ref 6–20)
BUN: <5 mg/dL — ABNORMAL LOW (ref 6–20)
BUN: <5 mg/dL — ABNORMAL LOW (ref 6–20)
BUN: <5 mg/dL — ABNORMAL LOW (ref 6–20)
CO2: 20 mmol/L — ABNORMAL LOW (ref 22–32)
CO2: 17 mmol/L — ABNORMAL LOW (ref 22–32)
CO2: 20 mmol/L — ABNORMAL LOW (ref 22–32)
CO2: 21 mmol/L — ABNORMAL LOW (ref 22–32)
CO2: 22 mmol/L (ref 22–32)
CO2: 19 mmol/L — ABNORMAL LOW (ref 22–32)
Calcium: 7.9 mg/dL — ABNORMAL LOW (ref 8.9–10.3)
Calcium: 7.5 mg/dL — ABNORMAL LOW (ref 8.9–10.3)
Calcium: 7.9 mg/dL — ABNORMAL LOW (ref 8.9–10.3)
Calcium: 8.1 mg/dL — ABNORMAL LOW (ref 8.9–10.3)
Calcium: 8.3 mg/dL — ABNORMAL LOW (ref 8.9–10.3)
Calcium: 6.5 mg/dL — ABNORMAL LOW (ref 8.9–10.3)
Chloride: 111 mmol/L (ref 98–111)
Chloride: 107 mmol/L (ref 98–111)
Chloride: 106 mmol/L (ref 98–111)
Chloride: 107 mmol/L (ref 98–111)
Chloride: 108 mmol/L (ref 98–111)
Chloride: 116 mmol/L — ABNORMAL HIGH (ref 98–111)
Creatinine, Ser: 0.5 mg/dL (ref 0.44–1.00)
Creatinine, Ser: 0.46 mg/dL (ref 0.44–1.00)
Creatinine, Ser: 0.45 mg/dL (ref 0.44–1.00)
Creatinine, Ser: 0.48 mg/dL (ref 0.44–1.00)
Creatinine, Ser: 0.44 mg/dL (ref 0.44–1.00)
Creatinine, Ser: 0.35 mg/dL — ABNORMAL LOW (ref 0.44–1.00)
GFR calc Af Amer: >60 mL/min (ref >60)
GFR calc Af Amer: >60 mL/min (ref >60)
GFR calc Af Amer: >60 mL/min (ref >60)
GFR calc Af Amer: >60 mL/min (ref >60)
GFR calc Af Amer: >60 mL/min (ref >60)
GFR calc Af Amer: >60 mL/min (ref >60)
GFR calc non Af Amer: >60 mL/min (ref >60)
GFR calc non Af Amer: >60 mL/min (ref >60)
GFR calc non Af Amer: >60 mL/min (ref >60)
GFR calc non Af Amer: >60 mL/min (ref >60)
GFR calc non Af Amer: >60 mL/min (ref >60)
GFR calc non Af Amer: >60 mL/min (ref >60)
Glucose, Bld: 149 mg/dL — ABNORMAL HIGH (ref 70–99)
Glucose, Bld: 200 mg/dL — ABNORMAL HIGH (ref 70–99)
Glucose, Bld: 175 mg/dL — ABNORMAL HIGH (ref 70–99)
Glucose, Bld: 182 mg/dL — ABNORMAL HIGH (ref 70–99)
Glucose, Bld: 123 mg/dL — ABNORMAL HIGH (ref 70–99)
Glucose, Bld: 119 mg/dL — ABNORMAL HIGH (ref 70–99)
Potassium: 3.2 mmol/L — ABNORMAL LOW (ref 3.5–5.1)
Potassium: 2.9 mmol/L — ABNORMAL LOW (ref 3.5–5.1)
Potassium: 3.3 mmol/L — ABNORMAL LOW (ref 3.5–5.1)
Potassium: 3.3 mmol/L — ABNORMAL LOW (ref 3.5–5.1)
Potassium: 3.5 mmol/L (ref 3.5–5.1)
Potassium: 2.5 mmol/L — CL (ref 3.5–5.1)
Sodium: 136 mmol/L (ref 135–145)
Sodium: 134 mmol/L — ABNORMAL LOW (ref 135–145)
Sodium: 133 mmol/L — ABNORMAL LOW (ref 135–145)
Sodium: 136 mmol/L (ref 135–145)
Sodium: 136 mmol/L (ref 135–145)
Sodium: 140 mmol/L (ref 135–145)

## 2018-11-09 MED ORDER — POTASSIUM CHLORIDE 10 MEQ/100ML IV SOLN
10.0000 meq | INTRAVENOUS | Status: AC
  Administered 2018-11-09 (×6): 10 meq via INTRAVENOUS
  Filled 2018-11-09 (×6): qty 100

## 2018-11-09 MED ORDER — ALTEPLASE 2 MG IJ SOLR
2.0000 mg | Freq: Once | INTRAMUSCULAR | Status: AC
  Administered 2018-11-09: 2 mg

## 2018-11-09 MED ORDER — POTASSIUM CHLORIDE 10 MEQ/100ML IV SOLN
10.0000 meq | INTRAVENOUS | Status: AC
  Administered 2018-11-09 (×4): 10 meq via INTRAVENOUS
  Filled 2018-11-09 (×4): qty 100

## 2018-11-09 MED ORDER — FLUCONAZOLE IN SODIUM CHLORIDE 200-0.9 MG/100ML-% IV SOLN
200.0000 mg | INTRAVENOUS | Status: DC
  Administered 2018-11-09 – 2018-11-11 (×3): 200 mg via INTRAVENOUS
  Filled 2018-11-09 (×3): qty 100

## 2018-11-09 NOTE — Progress Notes (Signed)
PROGRESS NOTE    [name]  [id number] DOB: Feb 12, 1990 DOA: 11/04/2018 PCP: Vicenta Aly, FNP    Brief Narrative:   [name] is a 28 year old female with uncontrolled type 1 diabetes mellitus with multiple hospitalizations for diabetic gastroparesis, recently admitted for pyelonephritis with obstructive uropathy presented to the ED with intractable nausea and vomiting with diffuse abdominal cramping for past few days.  In the ED patient had persistent vomiting .  Admitted for severe diabetic gastroparesis.   Assessment & Plan:   Active Problems:   Uncontrolled type 1 diabetes mellitus (HCC)   Hypertension   Intractable nausea and vomiting   Gastroparesis due to DM (HCC)   Nausea & vomiting  Gastroparesis due to poorly controlled type 1 diabetes mellitus Intractable nausea and vomiting Patient with history of poorly controlled type 1 diabetes mellitus presenting to ED with intractable nausea and vomiting and diffuse abdominal cramping over the preceding few days.  Ultrasound abdomen notable for post cholecystectomy without biliary dilation, resolved right hydronephrosis.  Renal ultrasound on 11/07/2018 notable for diffuse bladder wall thickening associated with significant debris's possible cystitis, mild right hydronephrosis with thickening of the right renal pelvis, medical renal disease bilateral kidneys. --Nausea improving --Continue IV fluid hydration with D5 half-normal saline at 125 mL's per hour --Continue insulin drip until tolerating diet --Will attempt a clear liquid diet this afternoon --Continue scheduled Reglan --Phenergan and Zofran as needed --Supportive care  Uncontrolled type 1 diabetes mellitus with DKA Last hemoglobin A1c 8.7 on 10/07/2018.  On 11/07/2018, patient was found to be in DKA with significant anion gap metabolic acidosis with ketonuria. --Continue insulin drip; although DKA has now resolved, continues with poor oral  intake --Metabolic acidosis improving, CO2 up to 20 this afternoon --Continue to monitor BMET q4h and replacement of potassium and magnesium --Continue to monitor on telemetry --We will attempt clear liquid diet this afternoon, if tolerates may be able to transition off of insulin drip.  Sinus tachycardia: Resolved Suspect secondary to dehydration and DKA.  However this has been persistent since admission.  EKG showed sinus tachycardia with LVH.    TSH 1.208, within normal limits.  Echocardiogram with EF 60-65%. --Continue to monitor on telemetry  Yeast UTI with acute urinary retention Right pyelonephritis Hydronephrosis Patient complaining of dysuria with cloudy urine.  UA suggestive of UTI. Noted for significant postvoid residual 10/20.  Renal ultrasound showed thickened bladder suggestive of cystitis with moderate right-sided hydronephrosis.   --Urine culture with greater than 40 K yeast, will discontinue ceftriaxone --Start fluconazole 200 mg IV daily, plan 2 week treatment given cystitis/pyelonephritis --Continue Foley catheter  --Monitor strict I's and O's  Hypokalemia/hypomagnesium --Potassium 2.9 this morning, replete --Continue to monitor electrolytes closely to include magnesium  Essential hypertension Lisinopril on hold as patient n.p.o.  Continue as needed hydralazine.   DVT prophylaxis: Lovenox Code Status: Full code Family Communication: None Disposition Plan: Remain in SDU on insulin drip, attempt clear liquid diet this afternoon, further depending on clinical course   Consultants:   none  Procedures:  Transthoracic echocardiogram 11/08/2018: IMPRESSIONS    1. Left ventricular ejection fraction, by visual estimation, is 60 to 65%. The left ventricle has normal function. Normal left ventricular size. There is no left ventricular hypertrophy.  2. Global right ventricle has normal systolic function.The right ventricular size is normal. No increase in right  ventricular wall thickness.  3. Left atrial size was normal.  4. Right atrial size was normal.  5. The mitral valve is normal in  structure. No evidence of mitral valve regurgitation. No evidence of mitral stenosis.  6. The tricuspid valve is normal in structure. Tricuspid valve regurgitation is trivial.  7. The aortic valve is normal in structure. Aortic valve regurgitation was not visualized by color flow Doppler. Structurally normal aortic valve, with no evidence of sclerosis or stenosis.  8. The pulmonic valve was normal in structure. Pulmonic valve regurgitation is not visualized by color flow Doppler.  9. Normal pulmonary artery systolic pressure. 10. The tricuspid regurgitant velocity is 2.38 m/s, and with an assumed right atrial pressure of 3 mmHg, the estimated right ventricular systolic pressure is normal at 25.7 mmHg. 11. The inferior vena cava is normal in size with greater than 50% respiratory variability, suggesting right atrial pressure of 3 mmHg. 12. The interatrial septum appears to be lipomatous.  Antimicrobials:   Ceftriaxone -DC'd 11/09/2018  Fluconazole 200 mg IV q24: 10/21>>   Subjective: Patient seen and examined bedside, resting comfortably.  Sleeping but easily arousable.  Patient states her nausea is slightly improved and would like to attempt a clear liquid diet this afternoon.  Continues on insulin drip.  Metabolic acidosis improving.  Urine culture notable for greater than 40 K yeast, will discontinue Rocephin in favor of fluconazole IV given her cystitis and pyelonephritis appreciated on imaging studies.  Objective: Vitals:   11/09/18 0430 11/09/18 0600 11/09/18 0800 11/09/18 1000  BP:  (!) 148/107 99/62 123/81  Pulse: (!) 111 (!) 104 95 91  Resp: 17 18 19 18   Temp:   99 F (37.2 C)   TempSrc:   Oral   SpO2: 100% 99% 98% 100%  Weight:      Height:        Intake/Output Summary (Last 24 hours) at 11/09/2018 1119 Last data filed at 11/09/2018 1114 Gross  per 24 hour  Intake 4971.08 ml  Output 5850 ml  Net -878.92 ml   Filed Weights   11/04/18 1637 11/07/18 1958  Weight: 59 kg 60.2 kg    Examination:  General exam: Appears calm and comfortable  Respiratory system: Clear to auscultation. Respiratory effort normal. Cardiovascular system: S1 & S2 heard, RRR. No JVD, murmurs, rubs, gallops or clicks. No pedal edema. Gastrointestinal system: Abdomen is nondistended, soft and nontender. No organomegaly or masses felt. Normal bowel sounds heard. Central nervous system: Alert and oriented. No focal neurological deficits. Extremities: Symmetric 5 x 5 power. Skin: No rashes, lesions or ulcers Psychiatry: Judgement and insight appear normal. Mood & affect appropriate.     Data Reviewed: I have personally reviewed following labs and imaging studies  CBC: Recent Labs  Lab 11/04/18 1752 11/04/18 1759 11/05/18 0652  WBC 9.5  --  7.4  NEUTROABS 6.8  --   --   HGB 11.9* 12.6 12.7  HCT 37.2 37.0 41.4  MCV 90.1  --  92.6  PLT 487*  --  [id number]   Basic Metabolic Panel: Recent Labs  Lab 11/08/18 1018  11/08/18 1812 11/08/18 2145 11/09/18 0222 11/09/18 0544 11/09/18 0945  NA 135   < > 134* 135 136 134* 133*  K 3.5   < > 3.7 3.1* 3.2* 2.9* 3.3*  CL 111   < > 109 108 111 107 106  CO2 14*   < > 15* 17* 20* 17* 20*  GLUCOSE 162*   < > 218* 192* 149* 200* 175*  BUN 10   < > 5* <5* <5* <5* <5*  CREATININE 0.79   < > 0.61 0.49 0.50 0.46  0.45  CALCIUM 8.4*   < > 7.7* 7.8* 7.9* 7.5* 7.9*  MG 1.8  --   --   --   --   --   --    < > = values in this interval not displayed.   GFR: Estimated Creatinine Clearance: 85.8 mL/min (by C-G formula based on SCr of 0.45 mg/dL). Liver Function Tests: Recent Labs  Lab 11/04/18 1752 11/05/18 0652  AST 15 17  ALT 11 11  ALKPHOS 92 107  BILITOT 1.3* 0.8  PROT 7.9 8.9*  ALBUMIN 4.0 4.6   Recent Labs  Lab 11/04/18 1752  LIPASE 13   No results for input(s): AMMONIA in the last 168  hours. Coagulation Profile: No results for input(s): INR, PROTIME in the last 168 hours. Cardiac Enzymes: No results for input(s): CKTOTAL, CKMB, CKMBINDEX, TROPONINI in the last 168 hours. BNP (last 3 results) No results for input(s): PROBNP in the last 8760 hours. HbA1C: No results for input(s): HGBA1C in the last 72 hours. CBG: Recent Labs  Lab 11/09/18 0538 11/09/18 0657 11/09/18 0806 11/09/18 0911 11/09/18 1019  GLUCAP 194* 184* 173* 169* 141*   Lipid Profile: No results for input(s): CHOL, HDL, LDLCALC, TRIG, CHOLHDL, LDLDIRECT in the last 72 hours. Thyroid Function Tests: Recent Labs    11/08/18 1353  TSH 1.208   Anemia Panel: No results for input(s): VITAMINB12, FOLATE, FERRITIN, TIBC, IRON, RETICCTPCT in the last 72 hours. Sepsis Labs: No results for input(s): PROCALCITON, LATICACIDVEN in the last 168 hours.  Recent Results (from the past 240 hour(s))  SARS CORONAVIRUS 2 (TAT 6-24 HRS) Nasopharyngeal Nasopharyngeal Swab     Status: None   Collection Time: 11/04/18  8:14 PM   Specimen: Nasopharyngeal Swab  Result Value Ref Range Status   SARS Coronavirus 2 NEGATIVE NEGATIVE Final    Comment: (NOTE) SARS-CoV-2 target nucleic acids are NOT DETECTED. The SARS-CoV-2 RNA is generally detectable in upper and lower respiratory specimens during the acute phase of infection. Negative results do not preclude SARS-CoV-2 infection, do not rule out co-infections with other pathogens, and should not be used as the sole basis for treatment or other patient management decisions. Negative results must be combined with clinical observations, patient history, and epidemiological information. The expected result is Negative. Fact Sheet for Patients: SugarRoll.be Fact Sheet for Healthcare Providers: https://www.woods-mathews.com/ This test is not yet approved or cleared by the [place] FDA and  has been authorized for detection  and/or diagnosis of SARS-CoV-2 by FDA under an Emergency Use Authorization (EUA). This EUA will remain  in effect (meaning this test can be used) for the duration of the COVID-19 declaration under Section 56 4(b)(1) of the Act, 21 U.S.C. section 360bbb-3(b)(1), unless the authorization is terminated or revoked sooner. Performed at [facility], [place] [address], [place], [place] [address]   Culture, Urine     Status: Abnormal   Collection Time: 11/07/18  8:28 AM   Specimen: Urine, Random  Result Value Ref Range Status   Specimen Description   Final    URINE, RANDOM Performed at [facility] [address] [place], [place] [address]    Special Requests   Final    NONE Performed at [facility], [place] [address], [place], [place] [address]    Culture 40,000 COLONIES/mL YEAST (A)  Final   Report Status 11/08/2018 FINAL  Final  MRSA PCR Screening     Status: None   Collection Time: 11/07/18  7:56  PM   Specimen: Nasal Mucosa; Nasopharyngeal  Result Value Ref Range Status   MRSA by PCR NEGATIVE NEGATIVE Final    Comment:        The GeneXpert MRSA Assay (FDA approved for NASAL specimens only), is one component of a comprehensive MRSA colonization surveillance program. It is not intended to diagnose MRSA infection nor to guide or monitor treatment for MRSA infections. Performed at [facility], [place] [address], [place], [place] [address]   Culture, blood (routine x 2)     Status: None (Preliminary result)   Collection Time: 11/08/18  2:25 PM   Specimen: BLOOD  Result Value Ref Range Status   Specimen Description   Final    BLOOD LEFT ARM Performed at [facility] [address] [place], [place] [address]    Special Requests   Final    BOTTLES DRAWN AEROBIC ONLY Blood Culture results may not be optimal due to an inadequate volume of blood received in culture bottles Performed at [facility] [address] [place], [place] [address]    Culture   Final    NO GROWTH < 24 HOURS Performed at [facility] [address] [place], [place] [address]    Report Status PENDING  Incomplete  Culture, blood (routine x 2)     Status: None (Preliminary result)   Collection Time: 11/08/18  2:25 PM   Specimen: BLOOD  Result Value Ref Range Status   Specimen Description   Final    BLOOD LEFT ARM Performed at [facility] [address] [place], [place] [address]    Special Requests   Final    BOTTLES DRAWN AEROBIC ONLY Blood Culture results may not be optimal due to an inadequate volume of blood received in culture bottles Performed at [facility] [address] [place], [place] [address]    Culture   Final    NO GROWTH < 24 HOURS Performed at [facility] [address], [place], [place] [address]    Report Status PENDING  Incomplete         Radiology Studies: US Renal  Result Date: 11/07/2018 CLINICAL DATA:  Urinary retention, RIGHT-side pain, history type I diabetes mellitus, hypertension EXAM: RENAL / URINARY TRACT ULTRASOUND COMPLETE COMPARISON:  Renal ultrasound 10/06/2018 FINDINGS: Right Kidney: Renal measurements: 11.7 x 5.1 x 5.0 cm = volume: 156 mL. Normal cortical thickness. Increased cortical echogenicity. Mild RIGHT hydronephrosis. Mild thickening of the wall of the renal pelvis, 3 mm thick. No renal mass or shadowing calcification. Left Kidney: Renal measurements: 10.0 x 5.3 x 4.4 cm = volume: 125 mL. Normal cortical thickness. Increased cortical echogenicity. No mass, hydronephrosis, or shadowing calcification. Bladder: Bladder wall appears diffusely thickened and irregular. Scattered debris within bladder. No focal bladder mass. Other: N/A IMPRESSION: Diffuse bladder wall thickening and irregularity, associated with significant debris within bladder, question cystitis; recommend correlation  with urinalysis. Mild RIGHT hydronephrosis with thickening of the wall of the RIGHT renal pelvis, can be seen with pyelonephritis/chronic infection. Medical renal disease changes of both kidneys, suspect related to history of type I diabetes mellitus. Electronically Signed   By: Lavonia Dana M.D.   On: 11/07/2018 18:09   [place] Ekg Site Rite  Result Date: 11/07/2018 If Site Rite image not attached, placement could not be confirmed due to current cardiac rhythm.       Scheduled Meds:  Chlorhexidine Gluconate Cloth  6  each Topical Daily   enoxaparin (LOVENOX) injection  40 mg Subcutaneous Q24H   metoCLOPramide (REGLAN) injection  10 mg Intravenous Q8H   pantoprazole (PROTONIX) IV  40 mg Intravenous QHS   sodium chloride flush  10-40 mL Intracatheter Q12H   Continuous Infusions:  sodium chloride Stopped (11/07/18 2357)   cefTRIAXone (ROCEPHIN)  IV Stopped (11/09/18 1052)   dextrose 5 % and 0.45% NaCl 125 mL/hr at 11/09/18 1114   insulin 2.4 mL/hr at 11/09/18 1114   potassium chloride 100 mL/hr at 11/09/18 1114     LOS: 4 days    Time spent: 39 minutes spent on chart review, discussion with nursing staff, consultants, updating family and interview/physical exam; more than 50% of that time was spent in counseling and/or coordination of care.    Caoilainn Sacks J [place] DO [facility] Pager [phone]  If 7PM-7AM, please contact night-coverage [email or web address] Password [email or web address] 11/09/2018, 11:19 AM

## 2018-11-09 NOTE — Progress Notes (Addendum)
Anion gap 10; CO2 20; last 4 CBGs between 140 & 180. Paged K. Schorr. K. Schorr stated that we are unable to start the discontinue insulin drip due to active nausea/vomiting.   Also, new current CBG is 194.   Will continue to monitor.

## 2018-11-09 NOTE — Progress Notes (Signed)
One of the PICC's lumen clotted. Notified IV team through order. Will continue to monitor.

## 2018-11-09 NOTE — Progress Notes (Signed)
Potassium 3.2. Paged K. Schorr. Will continue to monitor.

## 2018-11-10 LAB — BASIC METABOLIC PANEL
Anion gap: 6 (ref 5–15)
Anion gap: 8 (ref 5–15)
Anion gap: 6 (ref 5–15)
BUN: <5 mg/dL — ABNORMAL LOW (ref 6–20)
BUN: <5 mg/dL — ABNORMAL LOW (ref 6–20)
BUN: <5 mg/dL — ABNORMAL LOW (ref 6–20)
CO2: 23 mmol/L (ref 22–32)
CO2: 24 mmol/L (ref 22–32)
CO2: 21 mmol/L — ABNORMAL LOW (ref 22–32)
Calcium: 8.3 mg/dL — ABNORMAL LOW (ref 8.9–10.3)
Calcium: 8.5 mg/dL — ABNORMAL LOW (ref 8.9–10.3)
Calcium: 7.9 mg/dL — ABNORMAL LOW (ref 8.9–10.3)
Chloride: 107 mmol/L (ref 98–111)
Chloride: 104 mmol/L (ref 98–111)
Chloride: 104 mmol/L (ref 98–111)
Creatinine, Ser: 0.57 mg/dL (ref 0.44–1.00)
Creatinine, Ser: 0.48 mg/dL (ref 0.44–1.00)
Creatinine, Ser: 0.62 mg/dL (ref 0.44–1.00)
GFR calc Af Amer: >60 mL/min (ref >60)
GFR calc Af Amer: >60 mL/min (ref >60)
GFR calc Af Amer: >60 mL/min (ref >60)
GFR calc non Af Amer: >60 mL/min (ref >60)
GFR calc non Af Amer: >60 mL/min (ref >60)
GFR calc non Af Amer: >60 mL/min (ref >60)
Glucose, Bld: 163 mg/dL — ABNORMAL HIGH (ref 70–99)
Glucose, Bld: 164 mg/dL — ABNORMAL HIGH (ref 70–99)
Glucose, Bld: 377 mg/dL — ABNORMAL HIGH (ref 70–99)
Potassium: 3.9 mmol/L (ref 3.5–5.1)
Potassium: 3.4 mmol/L — ABNORMAL LOW (ref 3.5–5.1)
Potassium: 3.9 mmol/L (ref 3.5–5.1)
Sodium: 136 mmol/L (ref 135–145)
Sodium: 136 mmol/L (ref 135–145)
Sodium: 131 mmol/L — ABNORMAL LOW (ref 135–145)

## 2018-11-10 LAB — GLUCOSE, CAPILLARY
Glucose-Capillary: 125 mg/dL — ABNORMAL HIGH (ref 70–99)
Glucose-Capillary: 127 mg/dL — ABNORMAL HIGH (ref 70–99)
Glucose-Capillary: 128 mg/dL — ABNORMAL HIGH (ref 70–99)
Glucose-Capillary: 138 mg/dL — ABNORMAL HIGH (ref 70–99)
Glucose-Capillary: 140 mg/dL — ABNORMAL HIGH (ref 70–99)
Glucose-Capillary: 152 mg/dL — ABNORMAL HIGH (ref 70–99)
Glucose-Capillary: 163 mg/dL — ABNORMAL HIGH (ref 70–99)
Glucose-Capillary: 164 mg/dL — ABNORMAL HIGH (ref 70–99)
Glucose-Capillary: 172 mg/dL — ABNORMAL HIGH (ref 70–99)
Glucose-Capillary: 181 mg/dL — ABNORMAL HIGH (ref 70–99)
Glucose-Capillary: 185 mg/dL — ABNORMAL HIGH (ref 70–99)
Glucose-Capillary: 206 mg/dL — ABNORMAL HIGH (ref 70–99)
Glucose-Capillary: 216 mg/dL — ABNORMAL HIGH (ref 70–99)
Glucose-Capillary: 251 mg/dL — ABNORMAL HIGH (ref 70–99)
Glucose-Capillary: 331 mg/dL — ABNORMAL HIGH (ref 70–99)

## 2018-11-10 LAB — MAGNESIUM
Magnesium: 1.7 mg/dL (ref 1.7–2.4)
Magnesium: 2.3 mg/dL (ref 1.7–2.4)

## 2018-11-10 MED ORDER — INSULIN ASPART 100 UNIT/ML ~~LOC~~ SOLN: 0.0000 [IU] | Freq: Every day | SUBCUTANEOUS | Status: DC

## 2018-11-10 MED ORDER — INSULIN ASPART 100 UNIT/ML ~~LOC~~ SOLN
0.0000 [IU] | Freq: Three times a day (TID) | SUBCUTANEOUS | Status: DC
  Administered 2018-11-11: 11 [IU] via SUBCUTANEOUS
  Administered 2018-11-11: 7 [IU] via SUBCUTANEOUS

## 2018-11-10 MED ORDER — POTASSIUM CHLORIDE 10 MEQ/100ML IV SOLN
10.0000 meq | INTRAVENOUS | Status: AC
  Administered 2018-11-10 (×4): 10 meq via INTRAVENOUS
  Filled 2018-11-10 (×4): qty 100

## 2018-11-10 MED ORDER — INSULIN ASPART 100 UNIT/ML ~~LOC~~ SOLN
4.0000 [IU] | Freq: Three times a day (TID) | SUBCUTANEOUS | Status: DC
  Administered 2018-11-10 – 2018-11-11 (×3): 4 [IU] via SUBCUTANEOUS

## 2018-11-10 MED ORDER — ORAL CARE MOUTH RINSE
15.0000 mL | Freq: Two times a day (BID) | OROMUCOSAL | Status: DC
  Administered 2018-11-10 (×2): 15 mL via OROMUCOSAL

## 2018-11-10 MED ORDER — INSULIN ASPART 100 UNIT/ML ~~LOC~~ SOLN
0.0000 [IU] | Freq: Every day | SUBCUTANEOUS | Status: DC
  Administered 2018-11-10: 3 [IU] via SUBCUTANEOUS

## 2018-11-10 MED ORDER — INSULIN GLARGINE 100 UNIT/ML ~~LOC~~ SOLN
26.0000 [IU] | Freq: Every day | SUBCUTANEOUS | Status: DC
  Administered 2018-11-11: 26 [IU] via SUBCUTANEOUS
  Filled 2018-11-10: qty 0.26

## 2018-11-10 MED ORDER — INSULIN ASPART 100 UNIT/ML ~~LOC~~ SOLN
0.0000 [IU] | Freq: Three times a day (TID) | SUBCUTANEOUS | Status: DC
  Administered 2018-11-10: 7 [IU] via SUBCUTANEOUS

## 2018-11-10 MED ORDER — MAGNESIUM SULFATE 2 GM/50ML IV SOLN
2.0000 g | Freq: Once | INTRAVENOUS | Status: AC
  Administered 2018-11-10: 2 g via INTRAVENOUS
  Filled 2018-11-10: qty 50

## 2018-11-10 MED ORDER — INSULIN GLARGINE 100 UNIT/ML ~~LOC~~ SOLN
20.0000 [IU] | Freq: Every day | SUBCUTANEOUS | Status: DC
  Administered 2018-11-10: 20 [IU] via SUBCUTANEOUS
  Filled 2018-11-10: qty 0.2

## 2018-11-10 NOTE — Progress Notes (Signed)
Inpatient Diabetes Program Recommendations  AACE/ADA: New Consensus Statement on Inpatient Glycemic Control (2015)  Target Ranges:  Prepandial:   less than 140 mg/dL      Peak postprandial:   less than 180 mg/dL (1-2 hours)      Critically ill patients:  140 - 180 mg/dL   Lab Results  Component Value Date   GLUCAP 128 (H) 11/10/2018   HGBA1C 8.7 (H) 10/07/2018    Review of Glycemic Control  Diabetes history: DM1 Outpatient Diabetes medications: Insulin pump (see settings below) Current orders for Inpatient glycemic control: IV insulin  Pump Settings: Basal rate - 1.4 units/Hour for total of 33.6 units/day Bolus - CHO ratio 1:10    Insulin sensitivity 66  HgbA1C - 8.7% - up from 8.1% Very familiar with pt from previous admissions. Endo is Dr. Hartford Poli for her Type 1 DM.  Ready to transition to SQ insulin. Would prefer pt transition to Lantus and Novolog. If insistent on insulin pump, pt will need to have all supplies and change site.  Lantus 20 units Q24H Novolog 0-9 units tidwc and 0-5 units QHS Novolog 4 units tidwc for meal coverage insulin.  Will follow closely.  Thank you. Lorenda Peck, RD, LDN, CDE Inpatient Diabetes Coordinator [phone]

## 2018-11-10 NOTE — Progress Notes (Addendum)
CRITICAL VALUE ALERT  Critical Value: potassium 2.5  Date & Time Notied:  11/10/18 0002  Provider Notified: Bodenheimer  Orders Received/Actions taken: new order for 4 more runs potassium

## 2018-11-10 NOTE — Progress Notes (Signed)
PROGRESS NOTE  [name] [id number] DOB: 1990-05-28 DOA: 11/04/2018 PCP: Vicenta Aly, FNP  Brief History   [name] is a 28 year old female with uncontrolled type 1 diabetes mellitus with multiple hospitalizations for diabetic gastroparesis, recently admitted for pyelonephritis with obstructive uropathy presented to the ED with intractable nausea and vomiting with diffuse abdominal cramping for past few days.  In the ED patient had persistent vomiting . Admitted for severe diabetic gastroparesis and DKA.  The patient's glucoses have been fairly well controlled over the past 24 hours, and her anion gap is closed. The patient states that she feels ready for clear liquids.  Consultants  . Diabetic coordinator  Procedures  . None  Antibiotics   Anti-infectives (From admission, onward)   Start     Dose/Rate Route Frequency Ordered Stop   11/09/18 1200  fluconazole (DIFLUCAN) IVPB 200 mg     200 mg 100 mL/hr over 60 Minutes Intravenous Every 24 hours 11/09/18 1136 11/23/18 1159   11/07/18 1030  cefTRIAXone (ROCEPHIN) 1 g in sodium chloride 0.9 % 100 mL IVPB  Status:  Discontinued     1 g 200 mL/hr over 30 Minutes Intravenous Every 24 hours 11/07/18 0924 11/09/18 1136    .  Subjective  The patient is resting comfortably. No new complaints. She stated that she had a BM this am. She states that she has an appetite and would like to start a clear liquid diet. She had some hypotension early this morning.  Objective   Vitals:  Vitals:   11/10/18 1000 11/10/18 1200  BP: 110/74 (!) 118/92  Pulse: 100 (!) 104  Resp: (!) 30 17  Temp:  98.7 F (37.1 C)  SpO2: 100% 92%   Exam:  Constitutional:  . The patient is awake, alert, and oriented x 3. No acute distress. Respiratory:  . No increased work of breathing. . No wheezes, rales, or rhonchi . No tactile fremitus Cardiovascular:  . Regular rate and rhythm . No murmurs, ectopy, or gallups. . No lateral  PMI. No thrills. Abdomen:  . Abdomen is soft, non-tender, non-distended . No hernias, masses, or organomegaly . Normoactive bowel sounds.  Musculoskeletal:  . No cyanosis, clubbing, or edema Skin:  . No rashes, lesions, ulcers . palpation of skin: no induration or nodules Neurologic:  . CN 2-12 intact . Sensation all 4 extremities intact Psychiatric:  . Mental status o Mood, affect appropriate o Orientation to person, place, time  . judgment and insight appear intact  I have personally reviewed the following:   Today's Data  . Glucoses, BMP, Vitals  Micro Data  . Blood cultures: No growth in 48 hours.  Scheduled Meds: . Chlorhexidine Gluconate Cloth  6 each Topical Daily  . enoxaparin (LOVENOX) injection  40 mg Subcutaneous Q24H  . insulin aspart  0-5 Units Subcutaneous QHS  . insulin aspart  0-9 Units Subcutaneous TID WC  . insulin aspart  4 Units Subcutaneous TID WC  . insulin glargine  20 Units Subcutaneous Daily  . mouth rinse  15 mL Mouth Rinse BID  . metoCLOPramide (REGLAN) injection  10 mg Intravenous Q8H  . pantoprazole (PROTONIX) IV  40 mg Intravenous QHS  . sodium chloride flush  10-40 mL Intracatheter Q12H   Continuous Infusions: . sodium chloride Stopped (11/07/18 2357)  . dextrose 5 % and 0.45% NaCl 125 mL/hr at 11/10/18 0800  . fluconazole (DIFLUCAN) IV 200 mg (11/10/18 1209)  . insulin 3.7 mL/hr at 11/10/18 0800    Active Problems:  Uncontrolled type 1 diabetes mellitus (HCC)   Hypertension   Intractable nausea and vomiting   Gastroparesis due to DM (HCC)   Nausea & vomiting   LOS: 5 days   A & P   Intractable nausea and vomiting due: Due to diabetic gastroparesis. The patient has been NPO. She states that she has an appetite this morning. She had a BM this morning. Clear liquid diet has been started. Lantus will be started if she tolerates clears well. She can be weaned off of IV insulin 2 hours after lantus is in. I have discussed this with  nursing.  DKA: Pt was admitted with ketonuria and anion gap acidosis. Gap is not closed, metabolic acidosis is resolved. Clear liquid diet has been started. Lantus will be started if she tolerates clears well. She can be weaned off of IV insulin 2 hours after lantus is in. I have discussed this with nursing.  Hypokalemia/hypomagnesemia: Supplemented. Monitor.  Sinus tachycardia: Resolved. Continue to monitor on telemetry.  Yeast UTI with urinary retention: Continue IV fluconazole and foley catheter.  Essential Hypertension: Blood pressures are well controlled on no antihypertensives with a blood pressure with systolic of 88 this morning. Continue to monitor.  I have seen and examined this patient myself. I have spent 36 minutes in her evaluation and care.  Marceline Napierala, DO [facility] Direct contact: see [email or web address]  7PM-7AM contact night coverage as above 11/10/2018, 12:38 PM  LOS: 5 days

## 2018-11-10 NOTE — Progress Notes (Addendum)
Magnesium 1.2. Paged Bodenheimer. New order for IV mag. Will continue to monitor.

## 2018-11-11 ENCOUNTER — Encounter (HOSPITAL_COMMUNITY): Payer: Self-pay

## 2018-11-11 LAB — BASIC METABOLIC PANEL
Anion gap: 8 (ref 5–15)
BUN: 7 mg/dL (ref 6–20)
CO2: 23 mmol/L (ref 22–32)
Calcium: 8.4 mg/dL — ABNORMAL LOW (ref 8.9–10.3)
Chloride: 105 mmol/L (ref 98–111)
Creatinine, Ser: 0.68 mg/dL (ref 0.44–1.00)
GFR calc Af Amer: >60 mL/min (ref >60)
GFR calc non Af Amer: >60 mL/min (ref >60)
Glucose, Bld: 303 mg/dL — ABNORMAL HIGH (ref 70–99)
Potassium: 3.7 mmol/L (ref 3.5–5.1)
Sodium: 136 mmol/L (ref 135–145)

## 2018-11-11 LAB — GLUCOSE, CAPILLARY
Glucose-Capillary: 292 mg/dL — ABNORMAL HIGH (ref 70–99)
Glucose-Capillary: 225 mg/dL — ABNORMAL HIGH (ref 70–99)

## 2018-11-11 MED ORDER — FLUCONAZOLE 100 MG PO TABS: 100.0000 mg | ORAL_TABLET | Freq: Every day | ORAL | 0 refills | Status: AC

## 2018-11-11 NOTE — Discharge Summary (Signed)
Physician Discharge Summary  [name] QPR:[id number] DOB: 1990/08/02 DOA: 11/04/2018  PCP: Stefanie Aly, FNP  Admit date: 11/04/2018 Discharge date: 11/11/2018  Recommendations for Outpatient Follow-up:  1. Resume insulin pump. 2. Follow up with PCP in 7-10 days. 3. Check glucoses twice daily. Record those numbers and take them in when you visit your PCP.  Discharge Diagnoses: Principal diagnosis is #1 1. DKA 2. Intractable nausea and vomiting 3. Diabetic gastroparesis 4. Hypokalemia 5. Hypomagnesemia 6. Sinus tachycardia 7. Essential hypertension  Discharge Condition: Fair  Disposition: Home  Diet recommendation: Carbohydrate controlled  Filed Weights   11/04/18 1637 11/07/18 1958  Weight: 59 kg 60.2 kg    History of present illness: [name] is a 28 y.o. female with history of diabetes mellitus type 1 and admissions for diabetic gastroparesis recently admitted for pyelonephritis with obstructive uropathy which improved with placement of Foley at that time patient also had gastroparesis with nausea vomiting presents to the ER with complaints of having intractable nausea vomiting with diffuse abdominal cramping pain characteristic of her diabetic gastroparesis.  Denies any blood in the vomitus.  Denies any diarrhea.  Denies any fever chills chest pain or productive cough.  ED Course: In the ER abdomen appears benign patient has been having persistent vomiting.  Labs show creatinine 1.7 LFTs were largely unremarkable.  Lipase was normal.  CBC shows hemoglobin 11.9 WBC 9.5 platelets 47 patient's anion gap was 10.  Since patient had hydronephrosis last time sonogram abdomen was done which was unremarkable.  Patient admitted for intractable nausea vomiting likely from diabetic gastroparesis.  UA and urine drug screen is pending.  COVID-19 is negative. Hospital Course:  The patient was admitted to a telemetry bed. She was placed on a DKA protocol. Her pump was  turned off. She was kept NPO initially and given IV Reglan. She able to be transitioned off of the insulin drip onto Lantus with prandial and correction insulin.Her diet was advanced. This morning she has been tolerating her carbohydrate controlled diet. The patient's urine grew out yeast. She was given IV diflucan. That will be transitioned to oral for discharge. She will resume the use of her pump on discharge. She is instructed to check her glucoses twice daily and report those numbers to her PCP.  Today's assessment: S: The patient is resting comfortably. No new complaints. O: Vitals:  Vitals:   11/11/18 1000 11/11/18 1228  BP: 111/82   Pulse: (!) 114   Resp: 19   Temp:  98.9 F (37.2 C)  SpO2: 92%    Constitutional:   The patient is awake, alert, and oriented x 3. No acute distress. Respiratory:   No increased work of breathing.  No wheezes, rales, or rhonchi  No tactile fremitus Cardiovascular:   Regular rate and rhythm  No murmurs, ectopy, or gallups.  No lateral PMI. No thrills. Abdomen:   Abdomen is soft, non-tender, non-distended  No hernias, masses, or organomegaly  Normoactive bowel sounds.  Musculoskeletal:   No cyanosis, clubbing, or edema Skin:   No rashes, lesions, ulcers  palpation of skin: no induration or nodules Neurologic:   CN 2-12 intact  Sensation all 4 extremities intact Psychiatric:   Mental status ? Mood, affect appropriate ? Orientation to person, place, time   judgment and insight appear intact    Discharge Instructions  Discharge Instructions    Activity as tolerated - No restrictions   Complete by: As directed    Call MD for:  persistant nausea and vomiting  Complete by: As directed    Call MD for:  severe uncontrolled pain   Complete by: As directed    Diet Carb Modified   Complete by: As directed    Discharge instructions   Complete by: As directed    Resume insulin pump. Follow up with PCP in 7-10  days. Check glucoses twice daily. Record those numbers and take them in when you visit your PCP.   Increase activity slowly   Complete by: As directed      Allergies as of 11/11/2018      Reactions   Peanut-containing Drug Products Swelling, Other (See Comments)   Reaction:  Swelling of mouth and lips    Food Swelling, Other (See Comments)   Pt is allergic to strawberries.   Reaction:  Swelling of mouth and lips    Ultram [tramadol] Itching      Medication List    TAKE these medications   albuterol 108 (90 Base) MCG/ACT inhaler Commonly known as: VENTOLIN HFA Inhale 1-2 puffs into the lungs every 6 (six) hours as needed for wheezing or shortness of breath.   blood glucose meter kit and supplies Dispense based on patient and insurance preference. Use up to four times daily as directed. (FOR ICD-10 E10.9, E11.9).   fluconazole 100 MG tablet Commonly known as: Diflucan Take 1 tablet (100 mg total) by mouth daily for 8 days.   glucagon 1 MG injection Commonly known as: Glucagon Emergency Inject 1 mg into the muscle once as needed (For very low blood sugars. May repeat in 15-20 minutes if needed.).   Insulin Pen Needle 31G X 5 MM Misc 30 Units by Does not apply route at bedtime.   insulin pump Soln Inject 1 each into the skin continuous. Novolog insulin. Pt puts in her insulin pump: Medtronic 630G. Carb ratio 1-10, insulin sensitive 66. 1.10 from 12 to 12. Every 3 days needs to be replaced.   lisinopril 2.5 MG tablet Commonly known as: ZESTRIL Take 2.5 mg by mouth daily.   metoCLOPramide 10 MG tablet Commonly known as: REGLAN Take 1 tablet (10 mg total) by mouth 3 (three) times daily with meals. What changed:   when to take this  reasons to take this   ondansetron 4 MG disintegrating tablet Commonly known as: Zofran ODT Take 1 tablet (4 mg total) by mouth every 8 (eight) hours as needed for nausea or vomiting.   pantoprazole 40 MG tablet Commonly known as:  PROTONIX Take 1 tablet (40 mg total) by mouth daily for 7 days.   promethazine 25 MG suppository Commonly known as: PHENERGAN Place 1 suppository (25 mg total) rectally every 6 (six) hours as needed for nausea or vomiting.      Allergies  Allergen Reactions   Peanut-Containing Drug Products Swelling and Other (See Comments)    Reaction:  Swelling of mouth and lips    Food Swelling and Other (See Comments)    Pt is allergic to strawberries.   Reaction:  Swelling of mouth and lips    Ultram [Tramadol] Itching    The results of significant diagnostics from this hospitalization (including imaging, microbiology, ancillary and laboratory) are listed below for reference.    Significant Diagnostic Studies: US Abdomen Complete  Result Date: 11/04/2018 CLINICAL DATA:  Nausea and vomiting. EXAM: ABDOMEN ULTRASOUND COMPLETE COMPARISON:  CT 10/04/2018 FINDINGS: Gallbladder: Surgically absent. Common bile duct: Diameter: 3 mm. Liver: Subtle liver lesions on prior CT are not defined sonographically. Within normal limits in parenchymal  echogenicity. Portal vein is patent on color Doppler imaging with normal direction of blood flow towards the liver. IVC: No abnormality visualized. Pancreas: Visualized portion unremarkable. Spleen: Size and appearance within normal limits. Right Kidney: Length: 10.5 cm. Hydronephrosis on prior exam has resolved. Echogenicity within normal limits. Left Kidney: Length: 10.5 cm. Echogenicity within normal limits. No mass or hydronephrosis visualized. Abdominal aorta: No aneurysm visualized. Other findings: None. IMPRESSION: 1. Post cholecystectomy without biliary dilatation. 2. Resolved right hydronephrosis from CT last month. 3. Liver lesions on prior CT are not defined sonographically. Electronically Signed   By: Keith Rake M.D.   On: 11/04/2018 21:33   US Renal  Result Date: 11/07/2018 CLINICAL DATA:  Urinary retention, RIGHT-side pain, history type I diabetes  mellitus, hypertension EXAM: RENAL / URINARY TRACT ULTRASOUND COMPLETE COMPARISON:  Renal ultrasound 10/06/2018 FINDINGS: Right Kidney: Renal measurements: 11.7 x 5.1 x 5.0 cm = volume: 156 mL. Normal cortical thickness. Increased cortical echogenicity. Mild RIGHT hydronephrosis. Mild thickening of the wall of the renal pelvis, 3 mm thick. No renal mass or shadowing calcification. Left Kidney: Renal measurements: 10.0 x 5.3 x 4.4 cm = volume: 125 mL. Normal cortical thickness. Increased cortical echogenicity. No mass, hydronephrosis, or shadowing calcification. Bladder: Bladder wall appears diffusely thickened and irregular. Scattered debris within bladder. No focal bladder mass. Other: N/A IMPRESSION: Diffuse bladder wall thickening and irregularity, associated with significant debris within bladder, question cystitis; recommend correlation with urinalysis. Mild RIGHT hydronephrosis with thickening of the wall of the RIGHT renal pelvis, can be seen with pyelonephritis/chronic infection. Medical renal disease changes of both kidneys, suspect related to history of type I diabetes mellitus. Electronically Signed   By: Lavonia Dana M.D.   On: 11/07/2018 18:09   [place] Ekg Site Rite  Result Date: 11/07/2018 If Site Rite image not attached, placement could not be confirmed due to current cardiac rhythm.  [place] Ekg Site Rite  Result Date: 10/12/2018 If [facility] image not attached, placement could not be confirmed due to current cardiac rhythm.   Microbiology: Recent Results (from the past 240 hour(s))  SARS CORONAVIRUS 2 (TAT 6-24 HRS) Nasopharyngeal Nasopharyngeal Swab     Status: None   Collection Time: 11/04/18  8:14 PM   Specimen: Nasopharyngeal Swab  Result Value Ref Range Status   SARS Coronavirus 2 NEGATIVE NEGATIVE Final    Comment: (NOTE) SARS-CoV-2 target nucleic acids are NOT DETECTED. The SARS-CoV-2 RNA is generally detectable in upper and lower respiratory specimens during the acute phase of  infection. Negative results do not preclude SARS-CoV-2 infection, do not rule out co-infections with other pathogens, and should not be used as the sole basis for treatment or other patient management decisions. Negative results must be combined with clinical observations, patient history, and epidemiological information. The expected result is Negative. Fact Sheet for Patients: SugarRoll.be Fact Sheet for Healthcare Providers: https://www.woods-mathews.com/ This test is not yet approved or cleared by the [place] FDA and  has been authorized for detection and/or diagnosis of SARS-CoV-2 by FDA under an Emergency Use Authorization (EUA). This EUA will remain  in effect (meaning this test can be used) for the duration of the COVID-19 declaration under Section 56 4(b)(1) of the Act, 21 U.S.C. section 360bbb-3(b)(1), unless the authorization is terminated or revoked sooner. Performed at [facility], [place] [address] [place], [place] [address]   Culture, Urine     Status: Abnormal   Collection Time: 11/07/18  8:28 AM   Specimen: Urine, Random  Result Value Ref Range Status   Specimen Description   Final    URINE, RANDOM Performed at [facility] [address], [place], [place] [address]    Special Requests   Final    NONE Performed at [facility], [place] [address], [place], [place] [address]    Culture 40,000 COLONIES/mL YEAST (A)  Final   Report Status 11/08/2018 FINAL  Final  MRSA PCR Screening     Status: None   Collection Time: 11/07/18  7:56 PM   Specimen: Nasal Mucosa; Nasopharyngeal  Result Value Ref Range Status   MRSA by PCR NEGATIVE NEGATIVE Final    Comment:        The GeneXpert MRSA Assay (FDA approved for NASAL specimens only), is one component of a comprehensive MRSA colonization surveillance program. It is not intended to diagnose MRSA infection nor to guide or monitor  treatment for MRSA infections. Performed at [facility], [place] [address], [place], [place] [address]   Culture, blood (routine x 2)     Status: None (Preliminary result)   Collection Time: 11/08/18  2:25 PM   Specimen: BLOOD  Result Value Ref Range Status   Specimen Description   Final    BLOOD LEFT ARM Performed at [facility] [address] [place], [place] [address]    Special Requests   Final    BOTTLES DRAWN AEROBIC ONLY Blood Culture results may not be optimal due to an inadequate volume of blood received in culture bottles Performed at [facility] [address], [place], [place] [address]    Culture   Final    NO GROWTH 3 DAYS Performed at [facility], [place] [address] [place], [place] [address]    Report Status PENDING  Incomplete  Culture, blood (routine x 2)     Status: None (Preliminary result)   Collection Time: 11/08/18  2:25 PM   Specimen: BLOOD  Result Value Ref Range Status   Specimen Description   Final    BLOOD LEFT ARM Performed at [facility] [address] [place], [place] [address]    Special Requests   Final    BOTTLES DRAWN AEROBIC ONLY Blood Culture results may not be optimal due to an inadequate volume of blood received in culture bottles Performed at [facility] [address], [place], [place] [address]    Culture   Final    NO GROWTH 3 DAYS Performed at [facility], [place] [address] [place], [place] [address]    Report Status PENDING  Incomplete     Labs: Basic Metabolic Panel: Recent Labs  Lab 11/09/18 1347 11/09/18 1755 11/09/18 2311 11/10/18 0245 11/10/18 0653 11/10/18 1616 11/11/18 0430  NA 136 136 140 136 136 131* 136  K 3.3* 3.5 2.5* 3.9 3.4* 3.9 3.7  CL 107 108 116* 107 104 104 105  CO2 21* 22 19* 23 24 21* 23  GLUCOSE 182* 123* 119* 163* 164* 377* 303*  BUN <5* <5* <5* <5* <5* <5* 7  CREATININE 0.48 0.44  0.35* 0.57 0.48 0.62 0.68  CALCIUM 8.1* 8.3* 6.5* 8.3* 8.5* 7.9* 8.4*  MG 1.6* 1.5* 1.2* 1.7 2.3  --   --    Liver Function Tests: Recent Labs  Lab 11/04/18 1752 11/05/18 0652  AST 15 17  ALT 11 11  ALKPHOS 92 107  BILITOT 1.3* 0.8  PROT 7.9 8.9*  ALBUMIN 4.0  4.6   Recent Labs  Lab 11/04/18 1752  LIPASE 13   No results for input(s): AMMONIA in the last 168 hours. CBC: Recent Labs  Lab 11/04/18 1752 11/04/18 1759 11/05/18 0652  WBC 9.5  --  7.4  NEUTROABS 6.8  --   --   HGB 11.9* 12.6 12.7  HCT 37.2 37.0 41.4  MCV 90.1  --  92.6  PLT 487*  --  326   Cardiac Enzymes: No results for input(s): CKTOTAL, CKMB, CKMBINDEX, TROPONINI in the last 168 hours. BNP: BNP (last 3 results) No results for input(s): BNP in the last 8760 hours.  ProBNP (last 3 results) No results for input(s): PROBNP in the last 8760 hours.  CBG: Recent Labs  Lab 11/10/18 1304 11/10/18 1619 11/10/18 2025 11/11/18 0801 11/11/18 1214  GLUCAP 216* 331* 251* 292* 225*    Active Problems:   Uncontrolled type 1 diabetes mellitus (HCC)   Hypertension   Intractable nausea and vomiting   Gastroparesis due to DM (HCC)   Nausea & vomiting   Time coordinating discharge: 38 minutes.  Signed:        Dean Wonder, DO [facility]  11/11/2018, 1:16 PM

## 2018-11-11 NOTE — Progress Notes (Signed)
Pt discharged home at [place]. RN went over instructions w/ pt and wheeled pt out, where sister was waiting to pick her up.

## 2018-11-12 ENCOUNTER — Other Ambulatory Visit: Payer: Self-pay

## 2018-11-12 ENCOUNTER — Encounter (HOSPITAL_COMMUNITY): Payer: Self-pay

## 2018-11-12 ENCOUNTER — Emergency Department (HOSPITAL_COMMUNITY)
Admission: EM | Admit: 2018-11-12 | Discharge: 2018-11-13 | Disposition: A | Discharge status: Home / Self Care | Payer: Medicaid Other | Source: Home / Self Care | Attending: Emergency Medicine | Admitting: Emergency Medicine

## 2018-11-12 DIAGNOSIS — R112 Nausea with vomiting, unspecified: Secondary | ICD-10-CM | POA: Insufficient documentation

## 2018-11-12 DIAGNOSIS — R109 Unspecified abdominal pain: Secondary | ICD-10-CM | POA: Insufficient documentation

## 2018-11-12 DIAGNOSIS — Z91010 Allergy to peanuts: Secondary | ICD-10-CM | POA: Insufficient documentation

## 2018-11-12 DIAGNOSIS — R000 Tachycardia, unspecified: Secondary | ICD-10-CM | POA: Insufficient documentation

## 2018-11-12 DIAGNOSIS — J45909 Unspecified asthma, uncomplicated: Secondary | ICD-10-CM | POA: Insufficient documentation

## 2018-11-12 DIAGNOSIS — I110 Hypertensive heart disease with heart failure: Secondary | ICD-10-CM | POA: Insufficient documentation

## 2018-11-12 DIAGNOSIS — Z8639 Personal history of other endocrine, nutritional and metabolic disease: Secondary | ICD-10-CM

## 2018-11-12 DIAGNOSIS — Z79899 Other long term (current) drug therapy: Secondary | ICD-10-CM | POA: Insufficient documentation

## 2018-11-12 DIAGNOSIS — I5032 Chronic diastolic (congestive) heart failure: Secondary | ICD-10-CM | POA: Insufficient documentation

## 2018-11-12 DIAGNOSIS — Z794 Long term (current) use of insulin: Secondary | ICD-10-CM | POA: Insufficient documentation

## 2018-11-12 DIAGNOSIS — R1084 Generalized abdominal pain: Secondary | ICD-10-CM | POA: Insufficient documentation

## 2018-11-12 LAB — I-STAT BETA HCG BLOOD, ED (MC, WL, AP ONLY): I-stat hCG, quantitative: <5 m[IU]/mL (ref <5)

## 2018-11-12 MED ORDER — SODIUM CHLORIDE 0.9% FLUSH
3.0000 mL | Freq: Once | INTRAVENOUS | Status: AC
  Administered 2018-11-13: 3 mL via INTRAVENOUS

## 2018-11-12 MED ORDER — SODIUM CHLORIDE 0.9 % IV BOLUS
1000.0000 mL | Freq: Once | INTRAVENOUS | Status: AC
  Administered 2018-11-13: 1000 mL via INTRAVENOUS

## 2018-11-12 NOTE — ED Triage Notes (Signed)
Pt reports N/V since last night. She states that she was just recently discharged from inpatient here. States that she has been unable to keep any medication down. States that her sugars have been running in the 90s.

## 2018-11-12 NOTE — ED Provider Notes (Signed)
[facility] DEPT Provider Note   CSN: [id number] Arrival date & time: 11/12/18  2031     History   Chief Complaint Chief Complaint  Patient presents with  . Emesis  . Abdominal Pain    HPI [name] is a 28 y.o. female.     Patient with history of diabetic gastroparesis, G2XB HTN, cyclical vomiting, pancreatitis, returns to the hospital after discharge yesterday for recurrent, uncontrolled nausea, vomiting and abdominal pain. No fever. Recent admission was for diagnosis DKA and intractable vomiting. She notes her insulin pump was resumed prior to discharge and her blood sugar has been running in the 80's and 90's. No hematemesis. No diarrhea.    Emesis Associated symptoms: abdominal pain   Associated symptoms: no chills and no fever   Abdominal Pain Associated symptoms: nausea and vomiting   Associated symptoms: no chills and no fever     Past Medical History:  Diagnosis Date  . Anxiety   . Arthritis    "hands, feet, knees" (12/18/2016)  . Asthma   . Diabetic gastroparesis ([place])    Per gastric emptying study 07/09/16 which showed significant delayed gastric emptying.  . Gallstones   . Gastroparesis   . GERD (gastroesophageal reflux disease)   . Heart murmur   . Hepatic steatosis 11/26/2014   and hepatomegaly  . Hypertension    hx (12/18/2016)  . Intractable cyclical vomiting syndrome    Archie Endo 12/18/2016  . Liver mass 11/26/2014  . Pancreatitis, acute 11/26/2014  . Pneumonia    "as a teen X 1" (12/18/2016)  . Type I diabetes mellitus ([place]) 2007   IDDM.  poorly controlled, multiple admits with DKA    Patient Active Problem List   Diagnosis Date Noted  . Acute pyelonephritis 10/07/2018  . Hydronephrosis of right kidney 10/05/2018  . Diffuse pain   . Elevated blood pressure reading 12/03/2017  . Tachycardia 11/30/2017  . Hypernatremia 11/02/2017  . Nausea and vomiting 01/22/2017  . Nausea & vomiting 01/21/2017  .  Intractable cyclical vomiting syndrome 12/18/2016  . Leukocytosis 10/24/2016  . N&V (nausea and vomiting) 10/23/2016  . DKA, type 1 ([place]) 09/26/2016  . Thrombocytosis ([place]) 07/08/2016  . Candiduria, asymptomatic 06/23/2016  . Hyponatremia 06/13/2016  . Hematuria 05/13/2016  . UTI (urinary tract infection) 01/22/2016  . Diarrhea 11/09/2015  . Acute urinary retention   . Gastroparesis due to DM ([place]) 07/10/2015  . GERD (gastroesophageal reflux disease) 07/10/2015  . Depression with anxiety 07/10/2015  . Gastroparesis 06/22/2015  . Altered mental status 06/22/2015  . Volume depletion 06/10/2015  . Protein-calorie malnutrition, severe 06/10/2015  . Hyperglycemia   . Elevated bilirubin   . Hematemesis with nausea   . Intractable nausea and vomiting 05/28/2015  . Abdominal pain in female   . Abdominal pain 05/24/2015  . Hypertension 05/24/2015  . Dehydration   . Diabetic gastroparesis ([place])   . Chronic diastolic heart failure ([place]) 04/11/2015  . Hematemesis 04/08/2015  . DKA (diabetic ketoacidoses) ([place]) 03/22/2015  . S/P laparoscopic cholecystectomy 02/11/2015  . Postextubation stridor   . Pancreatitis, acute 11/26/2014  . Volume overload 11/26/2014  . Hypokalemia 11/26/2014  . Hepatic steatosis 11/26/2014  . Liver mass 11/26/2014  . Sepsis ([place]) 11/25/2014  . Sinus tachycardia 11/25/2014  . Hypomagnesemia 11/25/2014  . Hypophosphatemia 11/25/2014  . Elevated LFTs 11/24/2014  . AKI (acute kidney injury) ([place]) 11/24/2014  . Migraine headache 11/24/2014  . Asthma 06/29/2012  . Uncontrolled type 1 diabetes mellitus ([place]) 06/19/2010  .  Goiter, unspecified 06/19/2010    Past Surgical History:  Procedure Laterality Date  . CHOLECYSTECTOMY N/A 02/11/2015   Procedure: LAPAROSCOPIC CHOLECYSTECTOMY WITH INTRAOPERATIVE CHOLANGIOGRAM;  Surgeon: Greer Pickerel, MD;  Location: [facility];  Service: General;  Laterality: N/A;  . ESOPHAGOGASTRODUODENOSCOPY (EGD) WITH PROPOFOL Left 09/20/2014    Procedure: ESOPHAGOGASTRODUODENOSCOPY (EGD) WITH PROPOFOL;  Surgeon: Arta Silence, MD;  Location: [facility] ENDOSCOPY;  Service: Endoscopy;  Laterality: Left;  . WISDOM TOOTH EXTRACTION       OB History    Gravida  2   Para  1   Term  0   Preterm  1   AB  1   Living  1     SAB  0   TAB  1   Ectopic  0   Multiple  0   Live Births  1            Home Medications    Prior to Admission medications   Medication Sig Start Date End Date Taking? Authorizing Provider  albuterol (PROVENTIL HFA;VENTOLIN HFA) 108 (90 Base) MCG/ACT inhaler Inhale 1-2 puffs into the lungs every 6 (six) hours as needed for wheezing or shortness of breath.    [provider]  blood glucose meter kit and supplies Dispense based on patient and insurance preference. Use up to four times daily as directed. (FOR ICD-10 E10.9, E11.9). 12/07/17   Eugenie Filler, MD  fluconazole (DIFLUCAN) 100 MG tablet Take 1 tablet (100 mg total) by mouth daily for 8 days. 11/11/18 11/19/18  Swayze, Ava, DO  glucagon (GLUCAGON EMERGENCY) 1 MG injection Inject 1 mg into the muscle once as needed (For very low blood sugars. May repeat in 15-20 minutes if needed.). 12/20/16   Hongalgi, Lenis Dickinson, MD  Insulin Human (INSULIN PUMP) SOLN Inject 1 each into the skin continuous. Novolog insulin. Pt puts in her insulin pump: Medtronic 630G. Carb ratio 1-10, insulin sensitive 66. 1.10 from 12 to 12. Every 3 days needs to be replaced.    [provider]  Insulin Pen Needle 31G X 5 MM MISC 30 Units by Does not apply route at bedtime. 12/07/17   Eugenie Filler, MD  lisinopril (ZESTRIL) 2.5 MG tablet Take 2.5 mg by mouth daily.    [provider]  metoCLOPramide (REGLAN) 10 MG tablet Take 1 tablet (10 mg total) by mouth 3 (three) times daily with meals. Patient taking differently: Take 10 mg by mouth 3 (three) times daily as needed for nausea or vomiting.  11/07/17 11/07/18  Debbe Odea, MD  ondansetron (ZOFRAN  ODT) 4 MG disintegrating tablet Take 1 tablet (4 mg total) by mouth every 8 (eight) hours as needed for nausea or vomiting. 10/16/18   Arrien, Jimmy Picket, MD  pantoprazole (PROTONIX) 40 MG tablet Take 1 tablet (40 mg total) by mouth daily for 7 days. Patient not taking: Reported on 11/04/2018 10/16/18 10/23/18  Arrien, Jimmy Picket, MD  promethazine (PHENERGAN) 25 MG suppository Place 1 suppository (25 mg total) rectally every 6 (six) hours as needed for nausea or vomiting. Patient not taking: Reported on 11/04/2018 10/16/18   Arrien, Jimmy Picket, MD    Family History Family History  Problem Relation Age of Onset  . Heart disease Maternal Grandmother   . Heart disease Maternal Grandfather   . Diabetes Mother   . Hyperlipidemia Mother   . Hypertension Father   . Heart disease Father   . Hypertension Paternal Grandmother   . Cancer Paternal Grandfather  Social History Social History   Tobacco Use  . Smoking status: Never Smoker  . Smokeless tobacco: Never Used  Substance Use Topics  . Alcohol use: No  . Drug use: Yes    Types: Marijuana    Comment: 12/18/2016 q few days"     Allergies   Peanut-containing drug products, Food, and Ultram [tramadol]   Review of Systems Review of Systems  Constitutional: Negative for chills and fever.  HENT: Negative.   Respiratory: Negative.   Cardiovascular: Negative.   Gastrointestinal: Positive for abdominal pain, nausea and vomiting.  Genitourinary: Negative for decreased urine volume.  Musculoskeletal: Negative.   Skin: Negative.   Neurological: Negative.      Physical Exam Updated Vital Signs BP (!) 120/98 (BP Location: Right Arm)   Pulse (!) 124   Temp 99.1 F (37.3 C) (Oral)   Resp 18   Ht _0  (1.6 m)   Wt 59 kg   SpO2 100%   BMI 23.03 kg/m   Physical Exam Vitals signs and nursing note reviewed.  Constitutional:      General: She is not in acute distress. HENT:     Mouth/Throat:     Mouth: Mucous  membranes are dry.  Cardiovascular:     Rate and Rhythm: Regular rhythm. Tachycardia present.     Heart sounds: No murmur.  Pulmonary:     Breath sounds: No wheezing, rhonchi or rales.  Abdominal:     General: Bowel sounds are decreased. There is no distension.     Palpations: Abdomen is soft.     Tenderness: There is generalized abdominal tenderness.  Skin:    General: Skin is warm and dry.  Neurological:     General: No focal deficit present.     Mental Status: She is alert and oriented to person, place, and time.      ED Treatments / Results  Labs (all labs ordered are listed, but only abnormal results are displayed) Labs Reviewed  LIPASE, BLOOD  COMPREHENSIVE METABOLIC PANEL  CBC  URINALYSIS, ROUTINE W REFLEX MICROSCOPIC  I-STAT BETA HCG BLOOD, ED ([facility], [facility], [facility] ONLY)    EKG None  Radiology No results found.  Procedures Procedures (including critical care time)  Medications Ordered in ED Medications  sodium chloride flush (NS) 0.9 % injection 3 mL (has no administration in time range)     Initial Impression / Assessment and Plan / ED Course  I have reviewed the triage vital signs and the nursing notes.  Pertinent labs & imaging results that were available during my care of the patient were reviewed by me and considered in my medical decision making (see chart for details).        Patient to ED with recurrent/chronic nausea, vomiting and generalized abdominal pain uncontrolled with home medications. No fever.   2:15 - she feels better after single dose IV phenergan. States she is ready to try PO challenge which is provided.   3:15 - patient feeling better. Tachycardia resolved. She is tolerating PO fluids and states she feels better enough for discharge home.    Final Clinical Impressions(s) / ED Diagnoses   Final diagnoses:  None   1. Nausea and vomiting 2. History of gastroparesis  ED Discharge Orders    None       Dennie Bible  11/13/18 0320    Palumbo, April, MD 11/13/18 [phone]

## 2018-11-12 NOTE — ED Notes (Signed)
Pt requesting to wait for IV placement for blood draw.

## 2018-11-13 ENCOUNTER — Inpatient Hospital Stay (HOSPITAL_COMMUNITY): Payer: Medicaid Other

## 2018-11-13 ENCOUNTER — Inpatient Hospital Stay (HOSPITAL_COMMUNITY)
Admission: EM | Admit: 2018-11-13 | Discharge: 2018-11-18 | DRG: 074 | Disposition: A | Discharge status: Home / Self Care | Payer: Medicaid Other | Attending: Internal Medicine | Admitting: Internal Medicine

## 2018-11-13 ENCOUNTER — Encounter (HOSPITAL_COMMUNITY): Payer: Self-pay

## 2018-11-13 DIAGNOSIS — Z91010 Allergy to peanuts: Secondary | ICD-10-CM

## 2018-11-13 DIAGNOSIS — Z8249 Family history of ischemic heart disease and other diseases of the circulatory system: Secondary | ICD-10-CM | POA: Diagnosis not present

## 2018-11-13 DIAGNOSIS — F1210 Cannabis abuse, uncomplicated: Secondary | ICD-10-CM | POA: Diagnosis not present

## 2018-11-13 DIAGNOSIS — E119 Type 2 diabetes mellitus without complications: Secondary | ICD-10-CM

## 2018-11-13 DIAGNOSIS — Z20828 Contact with and (suspected) exposure to other viral communicable diseases: Secondary | ICD-10-CM | POA: Diagnosis present

## 2018-11-13 DIAGNOSIS — M170 Bilateral primary osteoarthritis of knee: Secondary | ICD-10-CM | POA: Diagnosis present

## 2018-11-13 DIAGNOSIS — K3184 Gastroparesis: Secondary | ICD-10-CM | POA: Diagnosis present

## 2018-11-13 DIAGNOSIS — R112 Nausea with vomiting, unspecified: Secondary | ICD-10-CM

## 2018-11-13 DIAGNOSIS — Z794 Long term (current) use of insulin: Secondary | ICD-10-CM | POA: Diagnosis not present

## 2018-11-13 DIAGNOSIS — J45909 Unspecified asthma, uncomplicated: Secondary | ICD-10-CM | POA: Diagnosis present

## 2018-11-13 DIAGNOSIS — Z809 Family history of malignant neoplasm, unspecified: Secondary | ICD-10-CM

## 2018-11-13 DIAGNOSIS — K219 Gastro-esophageal reflux disease without esophagitis: Secondary | ICD-10-CM | POA: Diagnosis present

## 2018-11-13 DIAGNOSIS — Z885 Allergy status to narcotic agent status: Secondary | ICD-10-CM | POA: Diagnosis not present

## 2018-11-13 DIAGNOSIS — E1043 Type 1 diabetes mellitus with diabetic autonomic (poly)neuropathy: Principal | ICD-10-CM | POA: Diagnosis present

## 2018-11-13 DIAGNOSIS — M19041 Primary osteoarthritis, right hand: Secondary | ICD-10-CM | POA: Diagnosis present

## 2018-11-13 DIAGNOSIS — Z9641 Presence of insulin pump (external) (internal): Secondary | ICD-10-CM | POA: Diagnosis present

## 2018-11-13 DIAGNOSIS — M19072 Primary osteoarthritis, left ankle and foot: Secondary | ICD-10-CM | POA: Diagnosis present

## 2018-11-13 DIAGNOSIS — Z8349 Family history of other endocrine, nutritional and metabolic diseases: Secondary | ICD-10-CM

## 2018-11-13 DIAGNOSIS — M19071 Primary osteoarthritis, right ankle and foot: Secondary | ICD-10-CM | POA: Diagnosis present

## 2018-11-13 DIAGNOSIS — M79609 Pain in unspecified limb: Secondary | ICD-10-CM | POA: Diagnosis not present

## 2018-11-13 DIAGNOSIS — M19042 Primary osteoarthritis, left hand: Secondary | ICD-10-CM | POA: Diagnosis present

## 2018-11-13 DIAGNOSIS — E1010 Type 1 diabetes mellitus with ketoacidosis without coma: Secondary | ICD-10-CM | POA: Diagnosis present

## 2018-11-13 DIAGNOSIS — I808 Phlebitis and thrombophlebitis of other sites: Secondary | ICD-10-CM | POA: Diagnosis not present

## 2018-11-13 DIAGNOSIS — I10 Essential (primary) hypertension: Secondary | ICD-10-CM | POA: Diagnosis present

## 2018-11-13 DIAGNOSIS — Z452 Encounter for adjustment and management of vascular access device: Secondary | ICD-10-CM

## 2018-11-13 DIAGNOSIS — I159 Secondary hypertension, unspecified: Secondary | ICD-10-CM

## 2018-11-13 DIAGNOSIS — Z833 Family history of diabetes mellitus: Secondary | ICD-10-CM

## 2018-11-13 DIAGNOSIS — M7989 Other specified soft tissue disorders: Secondary | ICD-10-CM | POA: Diagnosis not present

## 2018-11-13 LAB — CBC
HCT: 39.7 % (ref 36.0–46.0)
Hemoglobin: 12.8 g/dL (ref 12.0–15.0)
MCH: 28.5 pg (ref 26.0–34.0)
MCHC: 32.2 g/dL (ref 30.0–36.0)
MCV: 88.4 fL (ref 80.0–100.0)
Platelets: 376 10*3/uL (ref 150–400)
RBC: 4.49 MIL/uL (ref 3.87–5.11)
RDW: 13.7 % (ref 11.5–15.5)
WBC: 10.4 10*3/uL (ref 4.0–10.5)
nRBC: 0 % (ref 0.0–0.2)

## 2018-11-13 LAB — CBC WITH DIFFERENTIAL/PLATELET
Abs Immature Granulocytes: 0.09 10*3/uL — ABNORMAL HIGH (ref 0.00–0.07)
Basophils Absolute: 0 10*3/uL (ref 0.0–0.1)
Basophils Relative: 0 %
Eosinophils Absolute: 0.2 10*3/uL (ref 0.0–0.5)
Eosinophils Relative: 1 %
HCT: 40.2 % (ref 36.0–46.0)
Hemoglobin: 13.1 g/dL (ref 12.0–15.0)
Immature Granulocytes: 1 %
Lymphocytes Relative: 11 %
Lymphs Abs: 2 10*3/uL (ref 0.7–4.0)
MCH: 28.4 pg (ref 26.0–34.0)
MCHC: 32.6 g/dL (ref 30.0–36.0)
MCV: 87 fL (ref 80.0–100.0)
Monocytes Absolute: 0.7 10*3/uL (ref 0.1–1.0)
Monocytes Relative: 4 %
Neutro Abs: 15.2 10*3/uL — ABNORMAL HIGH (ref 1.7–7.7)
Neutrophils Relative %: 83 %
Platelets: 385 10*3/uL (ref 150–400)
RBC: 4.62 MIL/uL (ref 3.87–5.11)
RDW: 13.7 % (ref 11.5–15.5)
WBC: 18.1 10*3/uL — ABNORMAL HIGH (ref 4.0–10.5)
nRBC: 0 % (ref 0.0–0.2)

## 2018-11-13 LAB — COMPREHENSIVE METABOLIC PANEL
ALT: 10 U/L (ref 0–44)
ALT: 12 U/L (ref 0–44)
AST: 17 U/L (ref 15–41)
AST: 21 U/L (ref 15–41)
Albumin: 3.4 g/dL — ABNORMAL LOW (ref 3.5–5.0)
Albumin: 4.1 g/dL (ref 3.5–5.0)
Alkaline Phosphatase: 107 U/L (ref 38–126)
Alkaline Phosphatase: 93 U/L (ref 38–126)
Anion gap: 12 (ref 5–15)
Anion gap: 19 — ABNORMAL HIGH (ref 5–15)
BUN: 8 mg/dL (ref 6–20)
BUN: 9 mg/dL (ref 6–20)
CO2: 12 mmol/L — ABNORMAL LOW (ref 22–32)
CO2: 29 mmol/L (ref 22–32)
Calcium: 10 mg/dL (ref 8.9–10.3)
Calcium: 8.8 mg/dL — ABNORMAL LOW (ref 8.9–10.3)
Chloride: 103 mmol/L (ref 98–111)
Chloride: 98 mmol/L (ref 98–111)
Creatinine, Ser: 0.84 mg/dL (ref 0.44–1.00)
Creatinine, Ser: 0.91 mg/dL (ref 0.44–1.00)
GFR calc Af Amer: >60 mL/min (ref >60)
GFR calc Af Amer: >60 mL/min (ref >60)
GFR calc non Af Amer: >60 mL/min (ref >60)
GFR calc non Af Amer: >60 mL/min (ref >60)
Glucose, Bld: 403 mg/dL — ABNORMAL HIGH (ref 70–99)
Glucose, Bld: 90 mg/dL (ref 70–99)
Potassium: 3 mmol/L — ABNORMAL LOW (ref 3.5–5.1)
Potassium: 5.3 mmol/L — ABNORMAL HIGH (ref 3.5–5.1)
Sodium: 134 mmol/L — ABNORMAL LOW (ref 135–145)
Sodium: 139 mmol/L (ref 135–145)
Total Bilirubin: 0.5 mg/dL (ref 0.3–1.2)
Total Bilirubin: 1 mg/dL (ref 0.3–1.2)
Total Protein: 6.7 g/dL (ref 6.5–8.1)
Total Protein: 8.5 g/dL — ABNORMAL HIGH (ref 6.5–8.1)

## 2018-11-13 LAB — CULTURE, BLOOD (ROUTINE X 2)
Culture: NO GROWTH
Culture: NO GROWTH

## 2018-11-13 LAB — LIPASE, BLOOD
Lipase: 13 U/L (ref 11–51)
Lipase: 19 U/L (ref 11–51)

## 2018-11-13 LAB — GLUCOSE, CAPILLARY
Glucose-Capillary: 401 mg/dL — ABNORMAL HIGH (ref 70–99)
Glucose-Capillary: 408 mg/dL — ABNORMAL HIGH (ref 70–99)

## 2018-11-13 LAB — MAGNESIUM: Magnesium: 1.5 mg/dL — ABNORMAL LOW (ref 1.7–2.4)

## 2018-11-13 MED ORDER — MORPHINE SULFATE (PF) 2 MG/ML IV SOLN
2.0000 mg | Freq: Once | INTRAVENOUS | Status: AC
  Administered 2018-11-13: 2 mg via INTRAVENOUS
  Filled 2018-11-13: qty 1

## 2018-11-13 MED ORDER — LORAZEPAM 2 MG/ML IJ SOLN
1.0000 mg | Freq: Once | INTRAMUSCULAR | Status: AC
  Administered 2018-11-13: 23:00:00 1 mg via INTRAMUSCULAR

## 2018-11-13 MED ORDER — ONDANSETRON HCL 4 MG/2ML IJ SOLN
INTRAMUSCULAR | Status: AC
  Administered 2018-11-13: 4 mg
  Filled 2018-11-13: qty 2

## 2018-11-13 MED ORDER — METOCLOPRAMIDE HCL 5 MG/ML IJ SOLN
10.0000 mg | Freq: Four times a day (QID) | INTRAMUSCULAR | Status: DC
  Administered 2018-11-14 – 2018-11-18 (×19): 10 mg via INTRAVENOUS
  Filled 2018-11-13 (×19): qty 2

## 2018-11-13 MED ORDER — FLUCONAZOLE 100MG IVPB
100.0000 mg | INTRAVENOUS | Status: AC
  Administered 2018-11-14 – 2018-11-17 (×5): 100 mg via INTRAVENOUS
  Filled 2018-11-13 (×7): qty 50

## 2018-11-13 MED ORDER — ONDANSETRON HCL 4 MG PO TABS: 4.0000 mg | ORAL_TABLET | Freq: Four times a day (QID) | ORAL | Status: DC | PRN

## 2018-11-13 MED ORDER — KETOROLAC TROMETHAMINE 30 MG/ML IJ SOLN
30.0000 mg | Freq: Once | INTRAMUSCULAR | Status: AC
  Administered 2018-11-13: 12:00:00 30 mg via INTRAVENOUS
  Filled 2018-11-13: qty 1

## 2018-11-13 MED ORDER — PROMETHAZINE HCL 25 MG/ML IJ SOLN
12.5000 mg | Freq: Once | INTRAMUSCULAR | Status: AC
  Administered 2018-11-13: 11:00:00 12.5 mg via INTRAVENOUS
  Filled 2018-11-13: qty 1

## 2018-11-13 MED ORDER — CHLORHEXIDINE GLUCONATE CLOTH 2 % EX PADS
6.0000 | MEDICATED_PAD | Freq: Every day | CUTANEOUS | Status: DC
  Administered 2018-11-13 – 2018-11-18 (×5): 6 via TOPICAL

## 2018-11-13 MED ORDER — LORAZEPAM 2 MG/ML IJ SOLN
1.0000 mg | Freq: Once | INTRAMUSCULAR | Status: DC
  Filled 2018-11-13: qty 1

## 2018-11-13 MED ORDER — SODIUM CHLORIDE 0.9 % IV SOLN
INTRAVENOUS | Status: DC
  Administered 2018-11-14: via INTRAVENOUS

## 2018-11-13 MED ORDER — INSULIN REGULAR(HUMAN) IN NACL 100-0.9 UT/100ML-% IV SOLN
INTRAVENOUS | Status: DC
  Administered 2018-11-14: 3 [IU]/h via INTRAVENOUS
  Administered 2018-11-15: 1.7 [IU]/h via INTRAVENOUS
  Filled 2018-11-13 (×2): qty 100

## 2018-11-13 MED ORDER — SODIUM CHLORIDE 0.9 % IV BOLUS
1000.0000 mL | Freq: Once | INTRAVENOUS | Status: AC
  Administered 2018-11-13: 10:00:00 1000 mL via INTRAVENOUS

## 2018-11-13 MED ORDER — INSULIN ASPART 100 UNIT/ML ~~LOC~~ SOLN: 10.0000 [IU] | Freq: Once | SUBCUTANEOUS | Status: DC

## 2018-11-13 MED ORDER — ONDANSETRON HCL 4 MG/2ML IJ SOLN
4.0000 mg | Freq: Four times a day (QID) | INTRAMUSCULAR | Status: DC | PRN
  Administered 2018-11-15 – 2018-11-16 (×3): 4 mg via INTRAVENOUS
  Filled 2018-11-13 (×4): qty 2

## 2018-11-13 MED ORDER — ACETAMINOPHEN 650 MG RE SUPP: 650.0000 mg | Freq: Four times a day (QID) | RECTAL | Status: DC | PRN

## 2018-11-13 MED ORDER — INSULIN PUMP
1.0000 | SUBCUTANEOUS | Status: DC
  Filled 2018-11-13: qty 1

## 2018-11-13 MED ORDER — SODIUM CHLORIDE 0.9 % IV SOLN
INTRAVENOUS | Status: AC
  Administered 2018-11-14: 01:00:00 via INTRAVENOUS

## 2018-11-13 MED ORDER — PROMETHAZINE HCL 25 MG/ML IJ SOLN: 12.5000 mg | Freq: Once | INTRAMUSCULAR | Status: DC

## 2018-11-13 MED ORDER — ALBUTEROL SULFATE (2.5 MG/3ML) 0.083% IN NEBU: 3.0000 mL | INHALATION_SOLUTION | Freq: Four times a day (QID) | RESPIRATORY_TRACT | Status: DC | PRN

## 2018-11-13 MED ORDER — TRAZODONE HCL 50 MG PO TABS
25.0000 mg | ORAL_TABLET | Freq: Every evening | ORAL | Status: DC | PRN
  Administered 2018-11-15 – 2018-11-18 (×2): 25 mg via ORAL
  Filled 2018-11-13 (×2): qty 1

## 2018-11-13 MED ORDER — SODIUM CHLORIDE 0.9 % IV BOLUS: 1000.0000 mL | Freq: Once | INTRAVENOUS | Status: AC

## 2018-11-13 MED ORDER — SODIUM CHLORIDE 0.9 % IV BOLUS
1000.0000 mL | Freq: Once | INTRAVENOUS | Status: AC
  Administered 2018-11-13: 1000 mL via INTRAVENOUS

## 2018-11-13 MED ORDER — METOCLOPRAMIDE HCL 5 MG/ML IJ SOLN
10.0000 mg | Freq: Once | INTRAMUSCULAR | Status: AC
  Administered 2018-11-13: 12:00:00 10 mg via INTRAVENOUS
  Filled 2018-11-13: qty 2

## 2018-11-13 MED ORDER — PROMETHAZINE HCL 25 MG/ML IJ SOLN
12.5000 mg | Freq: Once | INTRAMUSCULAR | Status: AC
  Administered 2018-11-13: 12.5 mg via INTRAVENOUS
  Filled 2018-11-13: qty 1

## 2018-11-13 MED ORDER — DEXTROSE-NACL 5-0.45 % IV SOLN
INTRAVENOUS | Status: DC
  Administered 2018-11-14 – 2018-11-15 (×3): via INTRAVENOUS

## 2018-11-13 MED ORDER — ACETAMINOPHEN 325 MG PO TABS
650.0000 mg | ORAL_TABLET | Freq: Four times a day (QID) | ORAL | Status: DC | PRN
  Administered 2018-11-14 – 2018-11-15 (×2): 650 mg via ORAL
  Filled 2018-11-13 (×2): qty 2

## 2018-11-13 MED ORDER — ONDANSETRON HCL 4 MG/2ML IJ SOLN: 4.0000 mg | Freq: Once | INTRAMUSCULAR | Status: AC

## 2018-11-13 MED ORDER — PROMETHAZINE HCL 25 MG/ML IJ SOLN
25.0000 mg | Freq: Once | INTRAMUSCULAR | Status: AC
  Administered 2018-11-13: 16:00:00 25 mg via INTRAVENOUS
  Filled 2018-11-13: qty 1

## 2018-11-13 MED ORDER — PROMETHAZINE HCL 25 MG RE SUPP
25.0000 mg | Freq: Four times a day (QID) | RECTAL | Status: DC | PRN
  Filled 2018-11-13: qty 1

## 2018-11-13 MED ORDER — HEPARIN SODIUM (PORCINE) 5000 UNIT/ML IJ SOLN
5000.0000 [IU] | Freq: Three times a day (TID) | INTRAMUSCULAR | Status: DC
  Administered 2018-11-14 – 2018-11-18 (×15): 5000 [IU] via SUBCUTANEOUS
  Filled 2018-11-13 (×14): qty 1

## 2018-11-13 MED ORDER — POTASSIUM CHLORIDE IN NACL 40-0.9 MEQ/L-% IV SOLN
INTRAVENOUS | Status: DC
  Administered 2018-11-14: 125 mL/h via INTRAVENOUS
  Filled 2018-11-13: qty 1000

## 2018-11-13 MED ORDER — LORAZEPAM 1 MG PO TABS: 1.0000 mg | ORAL_TABLET | Freq: Once | ORAL | Status: AC

## 2018-11-13 MED ORDER — DIPHENHYDRAMINE HCL 50 MG/ML IJ SOLN
25.0000 mg | Freq: Once | INTRAMUSCULAR | Status: AC
  Administered 2018-11-13: 25 mg via INTRAVENOUS
  Filled 2018-11-13: qty 1

## 2018-11-13 NOTE — Progress Notes (Signed)
Pt blood sugar was 401. MD paged. MD noted bicarb was off and pt needed to be transferred to stepdown. IV team unable to get line established, and stated pt will need central line placed when she gets to the second floor.

## 2018-11-13 NOTE — ED Provider Notes (Signed)
  Face-to-face evaluation   History: Patient presenting with persistent vomiting despite treatment overnight, and home medications.  She reports these are her typical gastroparesis symptoms.  She has not seen her GI doctor in 2 months even though she has been hospitalized for multiple visits in the last 6 weeks.  Physical exam: Alert, walks with a hunched gait.  Mucous memories are moist.  Continuously spitting saliva and occasionally vomiting green emesis.  5:22 PM-plan: Hospitalist to admit however labs not done yet.  Will order them and consult hospitalist for admission for intractable vomiting with history of gastroparesis, patient HAS iddm.  Medical screening examination/treatment/procedure(s) were conducted as a shared visit with non-physician practitioner(s) and myself.  I personally evaluated the patient during the encounter    Daleen Bo, MD 11/14/18 1418

## 2018-11-13 NOTE — H&P (Signed)
History and Physical    [name] KTG:[id number] DOB: [birth year] DOA: 11/13/2018  PCP: Vicenta Aly, FNP  Patient coming from: home  I have personally briefly reviewed patient's old medical records in [place]  Chief Complaint: intractable N/V  HPI: [name] is a 28 y.o. female with medical history significant for IDDM on insulin pump recently discharged 10/23 after treatment for DKA, intractable nausea and vomiting with diabetic gastroparesis and electrolyte abnormalities.  Patient represents to the ER today with recurrent nausea and vomiting.  Patient was having significant emesis witnessed in the emergency room.  Patient reports he felt stable when she was discharged home 2 days ago but recurred again today.  She reports abdominal pain.  With vomiting that started began progressively worse today.  Nothing is made it better and patient continues to have vomiting.   ED Course: Patient is well-known.  She received Benadryl, Toradol, Reglan, morphine, Phenergan x2 and normal saline bolus labs were obtained overnight showed a potassium of 3.0 she is not acidotic, white count 10.4 hemoglobin 12.8 BUN and creatinine of 8 and 0.84 with a CO2 of 29  Review of Systems: As per HPI otherwise 10 point review of systems done notable for intractable nausea and vomiting all other reviewed and are negative Past Medical History:  Diagnosis Date  . Anxiety   . Arthritis    "hands, feet, knees" (12/18/2016)  . Asthma   . Diabetic gastroparesis ([place])    Per gastric emptying study 07/09/16 which showed significant delayed gastric emptying.  . Gallstones   . Gastroparesis   . GERD (gastroesophageal reflux disease)   . Heart murmur   . Hepatic steatosis 11/26/2014   and hepatomegaly  . Hypertension    hx (12/18/2016)  . Intractable cyclical vomiting syndrome    Archie Endo 12/18/2016  . Liver mass 11/26/2014  . Pancreatitis, acute 11/26/2014  . Pneumonia    "as a teen X 1"  (12/18/2016)  . Type I diabetes mellitus ([place]) 2007   IDDM.  poorly controlled, multiple admits with DKA    Past Surgical History:  Procedure Laterality Date  . CHOLECYSTECTOMY N/A 02/11/2015   Procedure: LAPAROSCOPIC CHOLECYSTECTOMY WITH INTRAOPERATIVE CHOLANGIOGRAM;  Surgeon: Greer Pickerel, MD;  Location: [facility];  Service: General;  Laterality: N/A;  . ESOPHAGOGASTRODUODENOSCOPY (EGD) WITH PROPOFOL Left 09/20/2014   Procedure: ESOPHAGOGASTRODUODENOSCOPY (EGD) WITH PROPOFOL;  Surgeon: Arta Silence, MD;  Location[facility] END[facility]COPY;  Service: Endoscopy;  Laterality: Left;  . WISDOM TOOTH EXTRACTION       reports that she has never smoked. She has never used smokeless tobacco. She reports current drug use. Drug: Marijuana. She reports that she does not drink alcohol.  Allergies  Allergen Reactions  . Peanut-Containing Drug Products Swelling and Other (See Comments)    Reaction:  Swelling of mouth and lips   . 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   Reaction:  Swelling of mouth and lips   . Ultram [Tramadol] Itching    Family History  Problem Relation Age of Onset  . Heart disease Maternal Grandmother   . Heart disease Maternal Grandfather   . Diabetes Mother   . Hyperlipidemia Mother   . Hypertension Father   . Heart disease Father   . Hypertension Paternal Grandmother   . Cancer Paternal Grandfather      Prior to Admission medications   Medication Sig Start Date End Date Taking? Authorizing Provider  albuterol (PROVENTIL HFA;VENTOLIN HFA) 108 (90 Base)  MCG/ACT inhaler Inhale 1-2 puffs into the lungs every 6 (six) hours as needed for wheezing or shortness of breath.    [provider]  blood glucose meter kit and supplies Dispense based on patient and insurance preference. Use up to four times daily as directed. (FOR ICD-10 E10.9, E11.9). 12/07/17   Eugenie Filler, MD  fluconazole (DIFLUCAN) 100 MG tablet Take 1 tablet (100 mg total)  by mouth daily for 8 days. 11/11/18 11/19/18  Swayze, Ava, DO  glucagon (GLUCAGON EMERGENCY) 1 MG injection Inject 1 mg into the muscle once as needed (For very low blood sugars. May repeat in 15-20 minutes if needed.). 12/20/16   Hongalgi, Lenis Dickinson, MD  Insulin Human (INSULIN PUMP) SOLN Inject 1 each into the skin continuous. Novolog insulin. Pt puts in her insulin pump: Medtronic 630G. Carb ratio 1-10, insulin sensitive 66. 1.10 from 12 to 12. Every 3 days needs to be replaced.    [provider]  Insulin Pen Needle 31G X 5 MM MISC 30 Units by Does not apply route at bedtime. 12/07/17   Eugenie Filler, MD  lisinopril (ZESTRIL) 2.5 MG tablet Take 2.5 mg by mouth daily.    [provider]  metoCLOPramide (REGLAN) 10 MG tablet Take 1 tablet (10 mg total) by mouth 3 (three) times daily with meals. Patient taking differently: Take 10 mg by mouth 3 (three) times daily as needed for nausea or vomiting.  11/07/17 11/07/18  Debbe Odea, MD  ondansetron (ZOFRAN ODT) 4 MG disintegrating tablet Take 1 tablet (4 mg total) by mouth every 8 (eight) hours as needed for nausea or vomiting. 10/16/18   Arrien, Jimmy Picket, MD  pantoprazole (PROTONIX) 40 MG tablet Take 1 tablet (40 mg total) by mouth daily for 7 days. Patient not taking: Reported on 11/04/2018 10/16/18 10/23/18  Arrien, Jimmy Picket, MD  promethazine (PHENERGAN) 25 MG suppository Place 1 suppository (25 mg total) rectally every 6 (six) hours as needed for nausea or vomiting. Patient not taking: Reported on 11/04/2018 10/16/18   Tawni Millers, MD    Physical Exam: Vitals:   11/13/18 1330 11/13/18 1430 11/13/18 1500 11/13/18 1710  BP: 139/90 (!) 136/93 (!) 149/104 (!) 148/108  Pulse: 100 (!) 101 97 (!) 118  Resp: 18 18 18 19   Temp:      TempSrc:      SpO2: 98% 99% 99% 98%  Weight:        Constitutional: NAD, calm, comfortable Vitals:   11/13/18 1330 11/13/18 1430 11/13/18 1500 11/13/18 1710  BP: 139/90 (!)  136/93 (!) 149/104 (!) 148/108  Pulse: 100 (!) 101 97 (!) 118  Resp: 18 18 18 19   Temp:      TempSrc:      SpO2: 98% 99% 99% 98%  Weight:       Eyes: PERRL, lids and conjunctivae normal ENMT: Mucous membranes are moist. Posterior pharynx clear of any exudate or lesions.Normal dentition.  Neck: normal, supple, no masses, no thyromegaly Respiratory: clear to auscultation bilaterally, no wheezing, no crackles. Normal respiratory effort. No accessory muscle use.  Cardiovascular: Regular rate and rhythm, no murmurs / rubs / gallops. No extremity edema. 2+ pedal pulses. No carotid bruits.  Abdomen: Tender to palpation diffusely no focal findings consistent with musculoskeletal pain from intractable nausea and vomiting, positive bowel sounds no rebound no guarding Musculoskeletal: no clubbing / cyanosis. No joint deformity upper and lower extremities. Good ROM, no contractures. Normal muscle tone.  Skin: no rashes, lesions, ulcers.  No induration Neurologic: CN 2-12 grossly intact. Sensation intact, DTR normal. Strength 5/5 in all 4.  Psychiatric: Normal judgment and insight. Alert and oriented x 3. Normal mood.    Labs on Admission: I have personally reviewed following labs and imaging studies  CBC: Recent Labs  Lab 11/12/18 2341  WBC 10.4  HGB 12.8  HCT 39.7  MCV 88.4  PLT 300   Basic Metabolic Panel: Recent Labs  Lab 11/09/18 1347 11/09/18 1755 11/09/18 2311 11/10/18 0245 11/10/18 0653 11/10/18 1616 11/11/18 0430 11/12/18 2341  NA 136 136 140 136 136 131* 136 139  K 3.3* 3.5 2.5* 3.9 3.4* 3.9 3.7 3.0*  CL 107 108 116* 107 104 104 105 98  CO2 21* 22 19* 23 24 21* 23 29  GLUCOSE 182* 123* 119* 163* 164* 377* 303* 90  BUN <5* <5* <5* <5* <5* <5* 7 8  CREATININE 0.48 0.44 0.35* 0.57 0.48 0.62 0.68 0.84  CALCIUM 8.1* 8.3* 6.5* 8.3* 8.5* 7.9* 8.4* 10.0  MG 1.6* 1.5* 1.2* 1.7 2.3  --   --   --    GFR: Estimated Creatinine Clearance: 81.7 mL/min (by C-G formula based on SCr  of 0.84 mg/dL). Liver Function Tests: Recent Labs  Lab 11/12/18 2341  AST 17  ALT 10  ALKPHOS 107  BILITOT 0.5  PROT 8.5*  ALBUMIN 4.1   Recent Labs  Lab 11/12/18 2341  LIPASE 19   No results for input(s): AMMONIA in the last 168 hours. Coagulation Profile: No results for input(s): INR, PROTIME in the last 168 hours. Cardiac Enzymes: No results for input(s): CKTOTAL, CKMB, CKMBINDEX, TROPONINI in the last 168 hours. BNP (last 3 results) No results for input(s): PROBNP in the last 8760 hours. HbA1C: No results for input(s): HGBA1C in the last 72 hours. CBG: Recent Labs  Lab 11/10/18 1304 11/10/18 1619 11/10/18 2025 11/11/18 0801 11/11/18 1214  GLUCAP 216* 331* 251* 292* 225*   Lipid Profile: No results for input(s): CHOL, HDL, LDLCALC, TRIG, CHOLHDL, LDLDIRECT in the last 72 hours. Thyroid Function Tests: No results for input(s): TSH, T4TOTAL, FREET4, T3FREE, THYROIDAB in the last 72 hours. Anemia Panel: No results for input(s): VITAMINB12, FOLATE, FERRITIN, TIBC, IRON, RETICCTPCT in the last 72 hours. Urine analysis:    Component Value Date/Time   COLORURINE YELLOW 11/07/2018 0829   APPEARANCEUR TURBID (A) 11/07/2018 0829   LABSPEC 1.012 11/07/2018 0829   PHURINE 6.0 11/07/2018 0829   GLUCOSEU >=500 (A) 11/07/2018 0829   GLUCOSEU >=1000 11/07/2012 1205   HGBUR MODERATE (A) 11/07/2018 0829   BILIRUBINUR NEGATIVE 11/07/2018 0829   KETONESUR 80 (A) 11/07/2018 0829   KETONESUR 80 (A) 11/07/2018 0829   PROTEINUR 100 (A) 11/07/2018 0829   UROBILINOGEN 0.2 11/24/2014 1045   NITRITE NEGATIVE 11/07/2018 0829   LEUKOCYTESUR LARGE (A) 11/07/2018 0829    Radiological Exams on Admission: No results found.  EKG: Independently reviewed.  None  Assessment/Plan Active Problems:   Intractable nausea and vomiting   HTN   IDDM    Gastroparesis: Patient with a long history of gastroparesis with intractable nausea and vomiting.  We will treat with antiemetics  aggressively, fluid resuscitation, electrolyte monitoring.  Insulin-dependent diabetes check blood glucose every 6 hours, will leave patient's insulin pump in place, clear liquid diet as tolerated  Hypertension we will hold patient's antihypertensive.  Currently she is on low-dose lisinopril 2.5 mg given patient's volume loss will hold for now  Marijuana use.  Hyperemesis, differential for marijuana use.  Patient denies  any recent use will check UDS  DVT prophylaxis: Heparin SQ  Code Status: Full Code Status History    Date Active Date Inactive Code Status Order ID Comments User Context   11/07/2018 [place] 11/11/2018 1922 Full Code [id number]  Louellen Molder, MD Inpatient   11/04/2018 2355 11/07/2018 1647 Full Code [id number]  Rise Patience, MD Inpatient   10/06/2018 1127 10/16/2018 1441 Full Code [id number]  Jonnie Finner, DO ED   10/04/2018 2002 10/05/2018 1405 Full Code [id number]  Orene Desanctis, DO ED   12/22/2017 1757 12/27/2017 2015 Full Code [id number]  Tawni Millers, MD Inpatient   12/19/2017 1737 12/21/2017 1403 Full Code [id number]  Cristal Deer, MD Inpatient   12/08/2017 1340 12/11/2017 1618 Full Code [id number]  Damita Lack, MD ED   11/29/2017 0635 12/07/2017 1840 Full Code [id number]  Rise Patience, MD Inpatient   11/15/2017 0503 11/21/2017 1747 Full Code [id number]  Rise Patience, MD Inpatient   11/02/2017 1757 11/07/2017 1354 Full Code [id number]  Charlynne Cousins, MD Inpatient   01/21/2017 2304 01/29/2017 1728 Full Code [id number]  Rise Patience, MD ED   12/18/2016 0704 12/20/2016 1727 Full Code [id number]  Rondel Jumbo, PA-C ED   11/05/2016 1525 11/11/2016 1456 Full Code [id number]  Phillips Grout, MD Inpatient   10/23/2016 1342 10/29/2016 2103 Full Code [id number]  Elwin Mocha, MD ED   09/26/2016 1503 09/29/2016 1533 Full Code [id number]  Georgette Shell, MD ED   07/15/2016 1712 07/18/2016 2035 Full Code [id number]  Damita Lack, MD  Inpatient   07/05/2016 1658 07/11/2016 1620 Full Code [id number]  Bonnielee Haff, MD Inpatient   06/23/2016 0856 06/28/2016 1346 Full Code [id number]  Janece Canterbury, MD Inpatient   06/12/2016 2137 06/14/2016 1723 Full Code [id number]  Sid Falcon, MD Inpatient   06/03/2016 2044 06/06/2016 2041 Full Code [id number]  Gardiner Barefoot, NP Inpatient   05/31/2016 2348 06/03/2016 2044 Full Code [id number]  Rise Patience, MD Inpatient   05/14/2016 0212 05/16/2016 1718 Full Code [id number]  Toy Baker, MD Inpatient   05/04/2016 2140 05/10/2016 2004 Full Code [id number]  Orson Eva, MD Inpatient   02/29/2016 2201 03/04/2016 1646 Full Code [id number]  Toy Baker, MD Inpatient   02/20/2016 1441 02/25/2016 1723 Full Code [id number]  Elgergawy, Silver Huguenin, MD Inpatient   01/22/2016 1021 01/25/2016 1607 Full Code [id number]  Orson Eva, MD Inpatient   01/17/2016 0634 01/19/2016 1944 Full Code [id number]  Etta Quill, DO ED   11/18/2015 2238 11/23/2015 1231 Full Code [id number]  Bonnielee Haff, MD Inpatient   11/09/2015 2048 11/12/2015 1535 Full Code [id number]  Toy Baker, MD Inpatient   11/09/2015 2048 11/09/2015 2048 Full Code [id number]  Toy Baker, MD Inpatient   11/03/2015 0238 11/06/2015 1301 Full Code [id number]  Jani Gravel, MD Inpatient   11/02/2015 0128 11/03/2015 0238 Full Code [id number]  Phillips Grout, MD Inpatient   07/10/2015 1645 07/13/2015 1431 Full Code [id number]  Ivor Costa, MD ED   07/01/2015 2203 07/06/2015 1522 Full Code [id number]  Gennaro Africa, MD ED   06/22/2015 0624 06/24/2015 1531 Full Code [id number]  Etta Quill, DO Inpatient   06/10/2015 0156 06/11/2015 1519 Full Code [id number]  Reubin Milan, MD Inpatient   05/28/2015 1609 06/03/2015 1932 Full Code [id number]  Barton Dubois, MD Inpatient   05/24/2015 2359 05/25/2015 1858 Full Code [id number]  Theressa Millard, MD Inpatient   04/21/2015 0931 04/23/2015 1445 Full Code [id number]  Gennaro Africa,  MD ED   04/08/2015 2229  04/15/2015 1605 Full Code [id number]  Reubin Milan, MD ED   03/22/2015 0243 03/25/2015 1811 Full Code [id number]  Rise Patience, MD ED   02/11/2015 1530 02/12/2015 1805 Full Code [id number]  Greer Pickerel, MD Inpatient   11/24/2014 1309 11/29/2014 1929 Full Code [id number]  Rama, Venetia Maxon, MD Inpatient   09/16/2014 0530 09/21/2014 2007 Full Code [id number]  Etta Quill, DO ED   06/29/2012 1400 07/01/2012 1517 Full Code [id number]  Velvet Bathe, MD Inpatient   12/08/2011 1543 12/12/2011 1450 Full Code [id number]  Chyrel Masson, RN Inpatient   Advance Care Planning Activity     Family Communication: No family at bedside discussed in detail with patient Disposition Plan:   Patient been inpatient for fluid resuscitation, IV  Antiemetics, and electrolyte monitoring.  Patient is not safe to medically discharge and require inpatient stay anticipate longer than 2 days Consults called: None Admission status: Inpatient   Nicolette Bang MD [facility]   If 7PM-7AM, please contact night-coverage   11/13/2018, 5:34 PM

## 2018-11-13 NOTE — Discharge Instructions (Addendum)
Continue your current medications at home. Return here with any worsening symptoms or new concerns.

## 2018-11-13 NOTE — ED Triage Notes (Signed)
Pt returns after being discharged at 0300. Pt states that since d/c, she has had 5 episodes of emesis. Pt c/o generalized abd pain.

## 2018-11-13 NOTE — Progress Notes (Signed)
In Progress  11/13/18 9:10 PM [place], Vida Roller, RN  IV team consulted for PIV. Patient stuck multiple times in ED and by this RN with the assistance of [place]. Unsuccessful. Patient has had PICCs in the past. Recommend central line placement.

## 2018-11-13 NOTE — ED Notes (Signed)
ED TO INPATIENT HANDOFF REPORT  ED Nurse Name and Phone #:   S Name/Age/Gender [name] [age] y.o. female Room/Bed: WA19/WA19  Code Status   Code Status: Prior  Home/SNF/Other Home Patient oriented to: self, place, time and situation Is this baseline? Yes   Triage Complete: Triage complete  Chief Complaint nausea/emesis/pain  Triage Note Pt returns after being discharged at 0300. Pt states that since d/c, she has had 5 episodes of emesis. Pt c/o generalized abd pain.   Allergies Allergies  Allergen Reactions  . Peanut-Containing Drug Products Swelling and Other (See Comments)    Reaction:  Swelling of mouth and lips   . 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   Reaction:  Swelling of mouth and lips   . Ultram [Tramadol] Itching    Level of Care/Admitting Diagnosis ED Disposition    ED Disposition Condition [place] [facility]: [place] [100102]  Level of Care: Med-Surg [16]  Covid Evaluation: Asymptomatic Screening Protocol (No Symptoms)  Diagnosis: Intractable nausea and vomiting [id number]  Admitting Physician: Marcell Anger [id number]  Attending Physician: Marcell Anger [id number]  Estimated length of stay: past midnight tomorrow  Certification:: I certify this patient will need inpatient services for at least 2 midnights  PT Class (Do Not Modify): Inpatient [101]  PT Acc Code (Do Not Modify): Private [1]       B Medical/Surgery History Past Medical History:  Diagnosis Date  . Anxiety   . Arthritis    "hands, feet, knees" (12/18/2016)  . Asthma   . Diabetic gastroparesis ([place])    Per gastric emptying study 07/09/16 which showed significant delayed gastric emptying.  . Gallstones   . Gastroparesis   . GERD (gastroesophageal reflux disease)   . Heart murmur   . Hepatic steatosis 11/26/2014   and hepatomegaly  . Hypertension    hx (12/18/2016)  . Intractable  cyclical vomiting syndrome    Archie Endo 12/18/2016  . Liver mass 11/26/2014  . Pancreatitis, acute 11/26/2014  . Pneumonia    "as a teen X 1" (12/18/2016)  . Type I diabetes mellitus ([place]) 2007   IDDM.  poorly controlled, multiple admits with DKA   Past Surgical History:  Procedure Laterality Date  . CHOLECYSTECTOMY N/A 02/11/2015   Procedure: LAPAROSCOPIC CHOLECYSTECTOMY WITH INTRAOPERATIVE CHOLANGIOGRAM;  Surgeon: Greer Pickerel, MD;  Location: [facility];  Service: General;  Laterality: N/A;  . ESOPHAGOGASTRODUODENOSCOPY (EGD) WITH PROPOFOL Left 09/20/2014   Procedure: ESOPHAGOGASTRODUODENOSCOPY (EGD) WITH PROPOFOL;  Surgeon: Arta Silence, MD;  [facility] ENDOSCOPY;  Service: Endoscopy;  Laterality: Left;  . WISDOM TOOTH EXTRACTION       A IV Location/Drains/Wounds Patient Lines/Drains/Airways Status   Active Line/Drains/Airways    None          Intake/Output Last 24 hours No intake or output data in the 24 hours ending 11/13/18 1805  Labs/Imaging Results for orders placed or performed during the hospital encounter of 11/12/18 (from the past 48 hour(s))  Lipase, blood     Status: None   Collection Time: 11/12/18 11:41 PM  Result Value Ref Range   Lipase 19 11 - 51 U/L    Comment: Performed at [facility], [facility] [address], [place], [place] [address]  Comprehensive metabolic panel     Status: Abnormal   Collection Time: 11/12/18 11:41 PM  Result Value Ref Range   Sodium 139 135 - 145 mmol/L   Potassium  3.0 (L) 3.5 - 5.1 mmol/L    Comment: DELTA CHECK NOTED   Chloride 98 98 - 111 mmol/L   CO2 29 22 - 32 mmol/L   Glucose, Bld 90 70 - 99 mg/dL   BUN 8 6 - 20 mg/dL   Creatinine, Ser 0.84 0.44 - 1.00 mg/dL   Calcium 10.0 8.9 - 10.3 mg/dL   Total Protein 8.5 (H) 6.5 - 8.1 g/dL   Albumin 4.1 3.5 - 5.0 g/dL   AST 17 15 - 41 U/L   ALT 10 0 - 44 U/L   Alkaline Phosphatase 107 38 - 126 U/L   Total Bilirubin 0.5 0.3 - 1.2 mg/dL   GFR calc non Af Amer >60 >60  mL/min   GFR calc Af Amer >60 >60 mL/min   Anion gap 12 5 - 15    Comment: Performed at [facility], [place] [address], [place], [place] [address]  CBC     Status: None   Collection Time: 11/12/18 11:41 PM  Result Value Ref Range   WBC 10.4 4.0 - 10.5 K/uL   RBC 4.49 3.87 - 5.11 MIL/uL   Hemoglobin 12.8 12.0 - 15.0 g/dL   HCT 39.7 36.0 - 46.0 %   MCV 88.4 80.0 - 100.0 fL   MCH 28.5 26.0 - 34.0 pg   MCHC 32.2 30.0 - 36.0 g/dL   RDW 13.7 11.5 - 15.5 %   Platelets 376 150 - 400 K/uL   nRBC 0.0 0.0 - 0.2 %    Comment: Performed at [facility], [place] [address], [place], [place] [address]  I-Stat beta hCG blood, ED     Status: None   Collection Time: 11/12/18 11:49 PM  Result Value Ref Range   I-stat hCG, quantitative <5.0 <5 mIU/mL   Comment 3            Comment:   GEST. AGE      CONC.  (mIU/mL)   <=1 WEEK        5 - 50     2 WEEKS       50 - 500     3 WEEKS       100 - 10,000     4 WEEKS     1,000 - 30,000        FEMALE AND NON-PREGNANT FEMALE:     LESS THAN 5 mIU/mL    No results found.  Pending Labs Unresulted Labs (From admission, onward)    Start     Ordered   11/13/18 1743  Urine rapid drug screen (hosp performed)  ONCE - STAT,   STAT     11/13/18 1747   11/13/18 1727  Magnesium  ONCE - STAT,   STAT     11/13/18 1726   11/13/18 1724  Comprehensive metabolic panel  Once,   STAT     11/13/18 1723   11/13/18 1724  CBC with Differential  Once,   STAT     11/13/18 1723   11/13/18 1724  Lipase, blood  Once,   STAT     11/13/18 1723   11/13/18 1659  SARS CORONAVIRUS 2 (TAT 6-24 HRS) Nasopharyngeal Nasopharyngeal Swab  (Asymptomatic/Tier 2 Patients Labs)  Once,   STAT    Question Answer Comment  Is this test for diagnosis or screening Screening   Symptomatic for COVID-19 as defined by CDC No   Hospitalized for COVID-19 No   Admitted to ICU for COVID-19 No  Previously tested for COVID-19 Yes   Resident in a congregate (group) care  setting No   Employed in healthcare setting No   Pregnant No      11/13/18 1658   Signed and Held  CBC  (heparin)  Once,   R    Comments: Baseline for heparin therapy IF NOT ALREADY DRAWN.  Notify MD if PLT < 100 K.    Signed and Held   Signed and Held  Creatinine, serum  (heparin)  Once,   R    Comments: Baseline for heparin therapy IF NOT ALREADY DRAWN.    Signed and Held   Signed and Held  Basic metabolic panel  Tomorrow morning,   R     Signed and Held   Signed and Held  CBC  Tomorrow morning,   R     Signed and Held          Vitals/Pain Today's Vitals   11/13/18 1430 11/13/18 1500 11/13/18 1710 11/13/18 1730  BP: (!) 136/93 (!) 149/104 (!) 148/108 (!) 158/109  Pulse: (!) 101 97 (!) 118 (!) 101  Resp: 18 18 19 20   Temp:      TempSrc:      SpO2: 99% 99% 98% 99%  Weight:      PainSc:        Isolation Precautions No active isolations  Medications Medications  sodium chloride 0.9 % bolus 1,000 mL (has no administration in time range)  promethazine (PHENERGAN) injection 12.5 mg (12.5 mg Intravenous Given 11/13/18 1046)  sodium chloride 0.9 % bolus 1,000 mL (0 mLs Intravenous Stopped 11/13/18 1227)  metoCLOPramide (REGLAN) injection 10 mg (10 mg Intravenous Given 11/13/18 1225)  diphenhydrAMINE (BENADRYL) injection 25 mg (25 mg Intravenous Given 11/13/18 1225)  ketorolac (TORADOL) 30 MG/ML injection 30 mg (30 mg Intravenous Given 11/13/18 1225)  morphine 2 MG/ML injection 2 mg (2 mg Intravenous Given 11/13/18 1403)  promethazine (PHENERGAN) injection 25 mg (25 mg Intravenous Given 11/13/18 1601)    Mobility walks Low fall risk   Focused Assessments    R Recommendations: See Admitting Provider Note  Report given to:   Additional Notes:

## 2018-11-13 NOTE — ED Notes (Signed)
Pt given diet gingerale as PO challenge.

## 2018-11-13 NOTE — Progress Notes (Addendum)
Report given ro second floor RN and pt transported via wheel chair.   Spoke to pt mother, [name]. Updated her on situation and that pt was transferred for a higher level of care.

## 2018-11-13 NOTE — ED Provider Notes (Signed)
[facility] DEPT Provider Note   CSN: [id number] Arrival date & time: 11/13/18  1749     History   Chief Complaint Chief Complaint  Patient presents with  . Emesis    HPI [name] is a 28 y.o. female with past medical history significant for type 1 diabetes, gastroparesis, cyclical vomiting, and recent DKA admission to the hospital (11/04/2018 to 11/11/2018) who presents to the ED for continued episodes of nausea and vomiting as well as continued generalized abdominal pain since her discharge from ED 6 hours ago for same symptoms.  She reports that she ran out of her Zofran ODT and Phenergan at home.  She states that in the past she has needed admission for IV medication to resolve her episodes of gastroparesis.  She denies any new or different symptoms from her previous episodes.  Patient was tested for COVID-19 on 11/04/2018 and resulted negative.  She denies any fevers, chills, lightheadedness, dizziness, headache, chest pain, shortness of breath, urinary symptoms, changes in bowels, or hematemesis.     HPI  Past Medical History:  Diagnosis Date  . Anxiety   . Arthritis    "hands, feet, knees" (12/18/2016)  . Asthma   . Diabetic gastroparesis ([place])    Per gastric emptying study 07/09/16 which showed significant delayed gastric emptying.  . Gallstones   . Gastroparesis   . GERD (gastroesophageal reflux disease)   . Heart murmur   . Hepatic steatosis 11/26/2014   and hepatomegaly  . Hypertension    hx (12/18/2016)  . Intractable cyclical vomiting syndrome    Archie Endo 12/18/2016  . Liver mass 11/26/2014  . Pancreatitis, acute 11/26/2014  . Pneumonia    "as a teen X 1" (12/18/2016)  . Type I diabetes mellitus ([place]) 2007   IDDM.  poorly controlled, multiple admits with DKA    Patient Active Problem List   Diagnosis Date Noted  . Diabetes mellitus ([place]) 11/13/2018  . HTN (hypertension) 11/13/2018  . Marijuana abuse 11/13/2018  .  Acute pyelonephritis 10/07/2018  . Hydronephrosis of right kidney 10/05/2018  . Diffuse pain   . Elevated blood pressure reading 12/03/2017  . Tachycardia 11/30/2017  . Hypernatremia 11/02/2017  . Nausea and vomiting 01/22/2017  . Nausea & vomiting 01/21/2017  . Intractable cyclical vomiting syndrome 12/18/2016  . Leukocytosis 10/24/2016  . N&V (nausea and vomiting) 10/23/2016  . DKA, type 1 ([place]) 09/26/2016  . Thrombocytosis ([place]) 07/08/2016  . Candiduria, asymptomatic 06/23/2016  . Hyponatremia 06/13/2016  . Hematuria 05/13/2016  . UTI (urinary tract infection) 01/22/2016  . Diarrhea 11/09/2015  . Acute urinary retention   . Gastroparesis due to DM ([place]) 07/10/2015  . GERD (gastroesophageal reflux disease) 07/10/2015  . Depression with anxiety 07/10/2015  . Gastroparesis 06/22/2015  . Altered mental status 06/22/2015  . Volume depletion 06/10/2015  . Protein-calorie malnutrition, severe 06/10/2015  . Hyperglycemia   . Elevated bilirubin   . Hematemesis with nausea   . Intractable nausea and vomiting 05/28/2015  . Abdominal pain in female   . Abdominal pain 05/24/2015  . Hypertension 05/24/2015  . Dehydration   . Diabetic gastroparesis ([place])   . Chronic diastolic heart failure ([place]) 04/11/2015  . Hematemesis 04/08/2015  . DKA (diabetic ketoacidoses) ([place]) 03/22/2015  . S/P laparoscopic cholecystectomy 02/11/2015  . Postextubation stridor   . Pancreatitis, acute 11/26/2014  . Volume overload 11/26/2014  . Hypokalemia 11/26/2014  . Hepatic steatosis 11/26/2014  . Liver mass 11/26/2014  . Sepsis ([place]) 11/25/2014  . Sinus  tachycardia 11/25/2014  . Hypomagnesemia 11/25/2014  . Hypophosphatemia 11/25/2014  . Elevated LFTs 11/24/2014  . AKI (acute kidney injury) ([place]) 11/24/2014  . Migraine headache 11/24/2014  . Asthma 06/29/2012  . Uncontrolled type 1 diabetes mellitus ([place]) 06/19/2010  . Goiter, unspecified 06/19/2010    Past Surgical History:  Procedure  Laterality Date  . CHOLECYSTECTOMY N/A 02/11/2015   Procedure: LAPAROSCOPIC CHOLECYSTECTOMY WITH INTRAOPERATIVE CHOLANGIOGRAM;  Surgeon: Greer Pickerel, MD;  Location: [facility] ORS;  Service: General;  Laterality: N/A;  . ESOPHAGOGASTRODUODENOSCOPY (EGD) WITH PROPOFOL Left 09/20/2014   Procedure: ESOPHAGOGASTRODUODENOSCOPY (EGD) WITH PROPOFOL;  Surgeon: Arta Silence, MD;  Location: [facility] ENDOSCOPY;  Service: Endoscopy;  Laterality: Left;  . WISDOM TOOTH EXTRACTION       OB History    Gravida  2   Para  1   Term  0   Preterm  1   AB  1   Living  1     SAB  0   TAB  1   Ectopic  0   Multiple  0   Live Births  1            Home Medications    Prior to Admission medications   Medication Sig Start Date End Date Taking? Authorizing Provider  albuterol (PROVENTIL HFA;VENTOLIN HFA) 108 (90 Base) MCG/ACT inhaler Inhale 1-2 puffs into the lungs every 6 (six) hours as needed for wheezing or shortness of breath.    [provider]  blood glucose meter kit and supplies Dispense based on patient and insurance preference. Use up to four times daily as directed. (FOR ICD-10 E10.9, E11.9). 12/07/17   Eugenie Filler, MD  fluconazole (DIFLUCAN) 100 MG tablet Take 1 tablet (100 mg total) by mouth daily for 8 days. 11/11/18 11/19/18  Swayze, Ava, DO  glucagon (GLUCAGON EMERGENCY) 1 MG injection Inject 1 mg into the muscle once as needed (For very low blood sugars. May repeat in 15-20 minutes if needed.). 12/20/16   Hongalgi, Lenis Dickinson, MD  Insulin Human (INSULIN PUMP) SOLN Inject 1 each into the skin continuous. Novolog insulin. Pt puts in her insulin pump: Medtronic 630G. Carb ratio 1-10, insulin sensitive 66. 1.10 from 12 to 12. Every 3 days needs to be replaced.    [provider]  Insulin Pen Needle 31G X 5 MM MISC 30 Units by Does not apply route at bedtime. 12/07/17   Eugenie Filler, MD  lisinopril (ZESTRIL) 2.5 MG tablet Take 2.5 mg by mouth daily.    [provider]  metoCLOPramide (REGLAN) 10 MG tablet Take 1 tablet (10 mg total) by mouth 3 (three) times daily with meals. Patient taking differently: Take 10 mg by mouth 3 (three) times daily as needed for nausea or vomiting.  11/07/17 11/07/18  Debbe Odea, MD  ondansetron (ZOFRAN ODT) 4 MG disintegrating tablet Take 1 tablet (4 mg total) by mouth every 8 (eight) hours as needed for nausea or vomiting. 10/16/18   Arrien, Jimmy Picket, MD  pantoprazole (PROTONIX) 40 MG tablet Take 1 tablet (40 mg total) by mouth daily for 7 days. Patient not taking: Reported on 11/04/2018 10/16/18 10/23/18  Arrien, Jimmy Picket, MD  promethazine (PHENERGAN) 25 MG suppository Place 1 suppository (25 mg total) rectally every 6 (six) hours as needed for nausea or vomiting. Patient not taking: Reported on 11/04/2018 10/16/18   Arrien, Jimmy Picket, MD    Family History Family History  Problem Relation Age of Onset  . Heart disease Maternal Grandmother   .  Heart disease Maternal Grandfather   . Diabetes Mother   . Hyperlipidemia Mother   . Hypertension Father   . Heart disease Father   . Hypertension Paternal Grandmother   . Cancer Paternal Grandfather     Social History Social History   Tobacco Use  . Smoking status: Never Smoker  . Smokeless tobacco: Never Used  Substance Use Topics  . Alcohol use: No  . Drug use: Yes    Types: Marijuana    Comment: 12/18/2016 q few days"     Allergies   Peanut-containing drug products, Food, and Ultram [tramadol]   Review of Systems Review of Systems  All other systems reviewed and are negative.    Physical Exam Updated Vital Signs BP (!) 158/109   Pulse (!) 101   Temp 98.8 F (37.1 C) (Oral)   Resp 20   Wt 59 kg   SpO2 99%   BMI 23.04 kg/m   Physical Exam Vitals signs and nursing note reviewed. Exam conducted with a chaperone present.  Constitutional:      Appearance: Normal appearance.     Comments: Rocking in discomfort.  Active emesis.   HENT:     Head: Normocephalic and atraumatic.  Eyes:     General: No scleral icterus.    Conjunctiva/sclera: Conjunctivae normal.  Neck:     Musculoskeletal: Normal range of motion and neck supple.  Cardiovascular:     Rate and Rhythm: Normal rate and regular rhythm.     Pulses: Normal pulses.     Heart sounds: Normal heart sounds.  Pulmonary:     Effort: Pulmonary effort is normal. No respiratory distress.     Breath sounds: Normal breath sounds.  Abdominal:     Comments: Abdomen soft, nondistended.  Diffuse mild tenderness to palpation.  No guarding.  No obvious masses appreciated.  Skin:    General: Skin is dry.  Neurological:     Mental Status: She is alert.     GCS: GCS eye subscore is 4. GCS verbal subscore is 5. GCS motor subscore is 6.  Psychiatric:        Mood and Affect: Mood normal.        Behavior: Behavior normal.        Thought Content: Thought content normal.      ED Treatments / Results  Labs (all labs ordered are listed, but only abnormal results are displayed) Labs Reviewed  SARS CORONAVIRUS 2 (TAT 6-24 HRS)  COMPREHENSIVE METABOLIC PANEL  CBC WITH DIFFERENTIAL/PLATELET  LIPASE, BLOOD  MAGNESIUM  RAPID URINE DRUG SCREEN, HOSP PERFORMED    EKG None  Radiology No results found.  Procedures Procedures (including critical care time)  Medications Ordered in ED Medications  sodium chloride 0.9 % bolus 1,000 mL (has no administration in time range)  promethazine (PHENERGAN) injection 12.5 mg (12.5 mg Intravenous Given 11/13/18 1046)  sodium chloride 0.9 % bolus 1,000 mL (0 mLs Intravenous Stopped 11/13/18 1227)  metoCLOPramide (REGLAN) injection 10 mg (10 mg Intravenous Given 11/13/18 1225)  diphenhydrAMINE (BENADRYL) injection 25 mg (25 mg Intravenous Given 11/13/18 1225)  ketorolac (TORADOL) 30 MG/ML injection 30 mg (30 mg Intravenous Given 11/13/18 1225)  morphine 2 MG/ML injection 2 mg (2 mg Intravenous Given 11/13/18 1403)  promethazine  (PHENERGAN) injection 25 mg (25 mg Intravenous Given 11/13/18 1601)     Initial Impression / Assessment and Plan / ED Course  I have reviewed the triage vital signs and the nursing notes.  Pertinent labs & imaging results  that were available during my care of the patient were reviewed by me and considered in my medical decision making (see chart for details).    Patient returns to the ED for same nausea, vomiting, abdominal discomfort that brought her to the ED last night.  She reports that it was resolved entirely with Phenergan, but while in my care it is proven to be adequate.  Her lab work from roughly 14 hours ago was reviewed and showed mild hypokalemia, but is otherwise unremarkable.  She has a relatively benign physical exam as her abdomen is nondistended, soft, and has no focal areas of tenderness.  She continues to complain of significant, 9 out of 10 generalized abdominal discomfort, so added Reglan, Benadryl, and Toradol.  Shortly thereafter, patient is still writhing in discomfort on examination.  She has received numerous IV antiemetics and Toradol for pain relief and she continues to complain of gastroparesis related abdominal discomfort and nausea.  Will provide her with morphine as she states that it has helped her with her discomfort in the past.   6:23 PM Patient was sleeping comfortably in her room.  Spoke with her and her abdominal discomfort is much improved after receiving morphine.  She still reports mild nausea and would appreciate another round of Phenergan.  6:23 PM Patient is still actively vomiting and complaining of continued generalized abdominal discomfort. At this point she has received a plethora of antiemetics and analgesics, with little relief. She cannot tolerate food or liquid by mouth and I believe that she will be admitted for her symptoms.   Hospitalist was consulted and she will be admitted for uncontrolled nausea and vomiting.       Final Clinical  Impressions(s) / ED Diagnoses   Final diagnoses:  Intractable nausea and vomiting    ED Discharge Orders    None       Corena Herter, PA-C 11/13/18 1823    Daleen Bo, MD 11/14/18 1416

## 2018-11-14 ENCOUNTER — Inpatient Hospital Stay (HOSPITAL_COMMUNITY): Payer: Medicaid Other

## 2018-11-14 DIAGNOSIS — E1010 Type 1 diabetes mellitus with ketoacidosis without coma: Secondary | ICD-10-CM

## 2018-11-14 LAB — GLUCOSE, CAPILLARY
Glucose-Capillary: 107 mg/dL — ABNORMAL HIGH (ref 70–99)
Glucose-Capillary: 108 mg/dL — ABNORMAL HIGH (ref 70–99)
Glucose-Capillary: 125 mg/dL — ABNORMAL HIGH (ref 70–99)
Glucose-Capillary: 129 mg/dL — ABNORMAL HIGH (ref 70–99)
Glucose-Capillary: 129 mg/dL — ABNORMAL HIGH (ref 70–99)
Glucose-Capillary: 142 mg/dL — ABNORMAL HIGH (ref 70–99)
Glucose-Capillary: 146 mg/dL — ABNORMAL HIGH (ref 70–99)
Glucose-Capillary: 147 mg/dL — ABNORMAL HIGH (ref 70–99)
Glucose-Capillary: 150 mg/dL — ABNORMAL HIGH (ref 70–99)
Glucose-Capillary: 154 mg/dL — ABNORMAL HIGH (ref 70–99)
Glucose-Capillary: 156 mg/dL — ABNORMAL HIGH (ref 70–99)
Glucose-Capillary: 172 mg/dL — ABNORMAL HIGH (ref 70–99)
Glucose-Capillary: 176 mg/dL — ABNORMAL HIGH (ref 70–99)
Glucose-Capillary: 183 mg/dL — ABNORMAL HIGH (ref 70–99)
Glucose-Capillary: 184 mg/dL — ABNORMAL HIGH (ref 70–99)
Glucose-Capillary: 197 mg/dL — ABNORMAL HIGH (ref 70–99)
Glucose-Capillary: 201 mg/dL — ABNORMAL HIGH (ref 70–99)
Glucose-Capillary: 216 mg/dL — ABNORMAL HIGH (ref 70–99)
Glucose-Capillary: 237 mg/dL — ABNORMAL HIGH (ref 70–99)
Glucose-Capillary: 358 mg/dL — ABNORMAL HIGH (ref 70–99)
Glucose-Capillary: 362 mg/dL — ABNORMAL HIGH (ref 70–99)
Glucose-Capillary: 96 mg/dL (ref 70–99)

## 2018-11-14 LAB — BASIC METABOLIC PANEL
Anion gap: 18 — ABNORMAL HIGH (ref 5–15)
Anion gap: 11 (ref 5–15)
Anion gap: 11 (ref 5–15)
Anion gap: 12 (ref 5–15)
Anion gap: 9 (ref 5–15)
Anion gap: 10 (ref 5–15)
BUN: 13 mg/dL (ref 6–20)
BUN: 14 mg/dL (ref 6–20)
BUN: 15 mg/dL (ref 6–20)
BUN: 14 mg/dL (ref 6–20)
BUN: 13 mg/dL (ref 6–20)
BUN: 11 mg/dL (ref 6–20)
CO2: 10 mmol/L — ABNORMAL LOW (ref 22–32)
CO2: 16 mmol/L — ABNORMAL LOW (ref 22–32)
CO2: 17 mmol/L — ABNORMAL LOW (ref 22–32)
CO2: 15 mmol/L — ABNORMAL LOW (ref 22–32)
CO2: 18 mmol/L — ABNORMAL LOW (ref 22–32)
CO2: 16 mmol/L — ABNORMAL LOW (ref 22–32)
Calcium: 8.4 mg/dL — ABNORMAL LOW (ref 8.9–10.3)
Calcium: 8.6 mg/dL — ABNORMAL LOW (ref 8.9–10.3)
Calcium: 8.6 mg/dL — ABNORMAL LOW (ref 8.9–10.3)
Calcium: 8.3 mg/dL — ABNORMAL LOW (ref 8.9–10.3)
Calcium: 8.3 mg/dL — ABNORMAL LOW (ref 8.9–10.3)
Calcium: 7.8 mg/dL — ABNORMAL LOW (ref 8.9–10.3)
Chloride: 108 mmol/L (ref 98–111)
Chloride: 112 mmol/L — ABNORMAL HIGH (ref 98–111)
Chloride: 110 mmol/L (ref 98–111)
Chloride: 107 mmol/L (ref 98–111)
Chloride: 107 mmol/L (ref 98–111)
Chloride: 105 mmol/L (ref 98–111)
Creatinine, Ser: 0.97 mg/dL (ref 0.44–1.00)
Creatinine, Ser: 0.92 mg/dL (ref 0.44–1.00)
Creatinine, Ser: 0.87 mg/dL (ref 0.44–1.00)
Creatinine, Ser: 0.8 mg/dL (ref 0.44–1.00)
Creatinine, Ser: 0.76 mg/dL (ref 0.44–1.00)
Creatinine, Ser: 0.65 mg/dL (ref 0.44–1.00)
GFR calc Af Amer: >60 mL/min (ref >60)
GFR calc Af Amer: >60 mL/min (ref >60)
GFR calc Af Amer: >60 mL/min (ref >60)
GFR calc Af Amer: >60 mL/min (ref >60)
GFR calc Af Amer: >60 mL/min (ref >60)
GFR calc Af Amer: >60 mL/min (ref >60)
GFR calc non Af Amer: >60 mL/min (ref >60)
GFR calc non Af Amer: >60 mL/min (ref >60)
GFR calc non Af Amer: >60 mL/min (ref >60)
GFR calc non Af Amer: >60 mL/min (ref >60)
GFR calc non Af Amer: >60 mL/min (ref >60)
GFR calc non Af Amer: >60 mL/min (ref >60)
Glucose, Bld: 351 mg/dL — ABNORMAL HIGH (ref 70–99)
Glucose, Bld: 180 mg/dL — ABNORMAL HIGH (ref 70–99)
Glucose, Bld: 120 mg/dL — ABNORMAL HIGH (ref 70–99)
Glucose, Bld: 212 mg/dL — ABNORMAL HIGH (ref 70–99)
Glucose, Bld: 152 mg/dL — ABNORMAL HIGH (ref 70–99)
Glucose, Bld: 180 mg/dL — ABNORMAL HIGH (ref 70–99)
Potassium: 4.2 mmol/L (ref 3.5–5.1)
Potassium: 4 mmol/L (ref 3.5–5.1)
Potassium: 4.2 mmol/L (ref 3.5–5.1)
Potassium: 4 mmol/L (ref 3.5–5.1)
Potassium: 3.6 mmol/L (ref 3.5–5.1)
Potassium: 3.5 mmol/L (ref 3.5–5.1)
Sodium: 136 mmol/L (ref 135–145)
Sodium: 139 mmol/L (ref 135–145)
Sodium: 138 mmol/L (ref 135–145)
Sodium: 134 mmol/L — ABNORMAL LOW (ref 135–145)
Sodium: 134 mmol/L — ABNORMAL LOW (ref 135–145)
Sodium: 131 mmol/L — ABNORMAL LOW (ref 135–145)

## 2018-11-14 LAB — RAPID URINE DRUG SCREEN, HOSP PERFORMED
Amphetamines: NOT DETECTED
Barbiturates: NOT DETECTED
Benzodiazepines: NOT DETECTED
Cocaine: NOT DETECTED
Opiates: POSITIVE — AB
Tetrahydrocannabinol: NOT DETECTED

## 2018-11-14 LAB — CBC
HCT: 32 % — ABNORMAL LOW (ref 36.0–46.0)
Hemoglobin: 9.9 g/dL — ABNORMAL LOW (ref 12.0–15.0)
MCH: 28.5 pg (ref 26.0–34.0)
MCHC: 30.9 g/dL (ref 30.0–36.0)
MCV: 92.2 fL (ref 80.0–100.0)
Platelets: 381 10*3/uL (ref 150–400)
RBC: 3.47 MIL/uL — ABNORMAL LOW (ref 3.87–5.11)
RDW: 14.2 % (ref 11.5–15.5)
WBC: 17 10*3/uL — ABNORMAL HIGH (ref 4.0–10.5)
nRBC: 0 % (ref 0.0–0.2)

## 2018-11-14 LAB — SARS CORONAVIRUS 2 (TAT 6-24 HRS): SARS Coronavirus 2: NEGATIVE

## 2018-11-14 MED ORDER — HYDROCODONE-ACETAMINOPHEN 5-325 MG PO TABS
1.0000 | ORAL_TABLET | Freq: Once | ORAL | Status: AC
  Administered 2018-11-14: 1 via ORAL
  Filled 2018-11-14: qty 1

## 2018-11-14 MED ORDER — POTASSIUM CHLORIDE 10 MEQ/100ML IV SOLN
10.0000 meq | INTRAVENOUS | Status: AC
  Administered 2018-11-15 (×4): 10 meq via INTRAVENOUS
  Filled 2018-11-14 (×5): qty 100

## 2018-11-14 MED ORDER — SODIUM CHLORIDE 0.9 % IV BOLUS
1000.0000 mL | Freq: Once | INTRAVENOUS | Status: AC
  Administered 2018-11-14: 1000 mL via INTRAVENOUS

## 2018-11-14 MED ORDER — SODIUM CHLORIDE 0.9% FLUSH: 10.0000 mL | INTRAVENOUS | Status: DC | PRN

## 2018-11-14 MED ORDER — SODIUM CHLORIDE 0.9% FLUSH
10.0000 mL | Freq: Two times a day (BID) | INTRAVENOUS | Status: DC
  Administered 2018-11-14 – 2018-11-18 (×9): 10 mL

## 2018-11-14 NOTE — Progress Notes (Signed)
Xcover DKA   A/P DKA Pt transferred to stepdown earlier.  Cont IV ns Cont insulin gtt Bmp q4h x5 Consulted PCCM for access   Abdominal cramping Trial of Norco 5/325mg  po x1

## 2018-11-14 NOTE — Procedures (Signed)
Central Venous Catheter Insertion Procedure Note Timarie Banicki RJ:[id number] [birth year]  Procedure: Insertion of Central Venous Catheter Indications: Drug and/or fluid administration  Procedure Details Consent: Risks of procedure as well as the alternatives and risks of each were explained to the (patient/caregiver).  Consent for procedure obtained. Time Out: Verified patient identification, verified procedure, site/side was marked, verified correct patient position, special equipment/implants available, medications/allergies/relevent history reviewed, required imaging and test results available.  Performed  Maximum sterile technique was used including antiseptics, cap, gloves, gown, hand hygiene, mask and sheet. Skin prep: Chlorhexidine; local anesthetic administered A antimicrobial bonded/coated triple lumen catheter was placed in the right internal jugular vein using the Seldinger technique. IJ very small, even with patient in trendelenburg. US guidance used.  Wire placed without resistance.  Catheter over wire to confirm placement, blood drew back easily, non pulsatile. Wire replaced, easily passed until about 15 cm.   Line placed easily over wire, Resistance to further advancement at 15cm  All three ports drawing dark blood back easily, flushing easily.   Evaluation Blood flow good Complications: No apparent complications Patient did tolerate procedure well. Chest X-ray ordered to verify placement.  CXR: pending.  Collier Bullock 11/14/2018, 12:05 AM

## 2018-11-14 NOTE — Progress Notes (Signed)
NP on call notified of glucostabilizer recommending to turn off insulin drip. Verbal order to keep insulin drip on lowest setting until next BMP is due at 2145. Drip continued at 0.64mL/hour. Will continue to monitor.

## 2018-11-14 NOTE — Progress Notes (Signed)
The patient's mother came to me in the hall and said that her daughter had a question for me. The patient asked me why her left hand and arm felt tight, and was swelling. Upon inspection the arm is warm to the touch, and has non-pitting edema with slight erythema to the forearm. The patient says that the arm does not hurt, just that the skin feels tight. Physician was paged concerning this and wrote an order to ultrasound the extremity. Ultrasound has not been completed as of end of shift. Swelling and pain has not increased. Will pass on to oncoming nurse to continue to assess change in status or condition.

## 2018-11-14 NOTE — Progress Notes (Signed)
PROGRESS NOTE    [name]  [id number] DOB: 06/01/1990 DOA: 11/13/2018 PCP: Vicenta Aly, FNP   Brief Narrative:  Per admitting HPI: [name] is a 28 y.o. female with medical history significant for IDDM on insulin pump recently discharged 10/23 after treatment for DKA, intractable nausea and vomiting with diabetic gastroparesis and electrolyte abnormalities.  Patient represents to the ER today with recurrent nausea and vomiting.  Patient was having significant emesis witnessed in the emergency room.  Patient reports he felt stable when she was discharged home 2 days ago but recurred again today.  She reports abdominal pain.  With vomiting that started began progressively worse today.  Nothing is made it better and patient continues to have vomiting.  Later in the evening patient's labs came back consistent with DKA.  Cross cover with the patient in stepdown with central line placement and insulin drip   Assessment & Plan:   Active Problems:   Intractable nausea and vomiting   Diabetes mellitus (HCC)   HTN (hypertension)   Marijuana abuse   Gastroparesis: Patient with a long history of gastroparesis with intractable nausea and vomiting.    She was treated with antiemetics, aggressively fluid resuscitated, currently not having nausea or vomiting.  DKA with insulin-dependent diabetes initially patient was admitted with previous labs not showing any evidence of DKA.  Follow-up labs did show DKA with a gap and Acidosis.  Patient was placed on insulin drip transfer down to the stepdown unit.  Currently acid-base is improving, gap is closed, awaiting afternoon BMP to proceed with DKA protocol.  Hypertension we will continue to hold patient's antihypertensive.  Currently she is on low-dose lisinopril 2.5 mg given patient's volume loss will hold for now  Marijuana use.  Hyperemesis, differential for marijuana use.  Patient denies any recent use, pending UDS  DVT  prophylaxis: Heparin SQ  Code Status: Full code    Code Status Orders  (From admission, onward)         Start     Ordered   11/13/18 1913  Full code  Continuous     11/13/18 1912        Code Status History    Date Active Date Inactive Code Status Order ID Comments User Context   11/07/2018 1647 11/11/2018 1922 Full Code [id number]  Louellen Molder, MD Inpatient   11/04/2018 2355 11/07/2018 1647 Full Code [id number]  Rise Patience, MD Inpatient   10/06/2018 1127 10/16/2018 1441 Full Code [id number]  Jonnie Finner, DO ED   10/04/2018 2002 10/05/2018 1405 Full Code [id number]  Orene Desanctis, DO ED   12/22/2017 1757 12/27/2017 2015 Full Code [id number]  Tawni Millers, MD Inpatient   12/19/2017 1737 12/21/2017 1403 Full Code [id number]  Cristal Deer, MD Inpatient   12/08/2017 1340 12/11/2017 1618 Full Code [id number]  Damita Lack, MD ED   11/29/2017 0635 12/07/2017 1840 Full Code [id number]  Rise Patience, MD Inpatient   11/15/2017 0503 11/21/2017 1747 Full Code [id number]  Rise Patience, MD Inpatient   11/02/2017 1757 11/07/2017 1354 Full Code [id number]  Charlynne Cousins, MD Inpatient   01/21/2017 2304 01/29/2017 1728 Full Code [id number]  Rise Patience, MD ED   12/18/2016 0704 12/20/2016 1727 Full Code [id number]  Rondel Jumbo, PA-C ED   11/05/2016 1525 11/11/2016 1456 Full Code [id number]  Phillips Grout, MD Inpatient   10/23/2016 1342 10/29/2016 2103 Full Code [id number]  Elwin Mocha, MD ED   09/26/2016 1503 09/29/2016  [place] Full Code [id number]  Georgette Shell, MD ED   07/15/2016 1712 07/18/2016 2035 Full Code [id number]  Damita Lack, MD Inpatient   07/05/2016 1658 07/11/2016 1620 Full Code [id number]  Bonnielee Haff, MD Inpatient   06/23/2016 0856 06/28/2016 1346 Full Code [id number]  Janece Canterbury, MD Inpatient   06/12/2016 2137 06/14/2016 1723 Full Code [id number]  Sid Falcon, MD Inpatient   06/03/2016 2044 06/06/2016 2041 Full Code  [id number]  Gardiner Barefoot, NP Inpatient   05/31/2016 2348 06/03/2016 2044 Full Code [id number]  Rise Patience, MD Inpatient   05/14/2016 0212 05/16/2016 1718 Full Code [id number]  Toy Baker, MD Inpatient   05/04/2016 2140 05/10/2016 2004 Full Code [id number]  Orson Eva, MD Inpatient   02/29/2016 2201 03/04/2016 1646 Full Code [id number]  Toy Baker, MD Inpatient   02/20/2016 1441 02/25/2016 1723 Full Code [id number]  Elgergawy, Silver Huguenin, MD Inpatient   01/22/2016 1021 01/25/2016 1607 Full Code [id number]  Orson Eva, MD Inpatient   01/17/2016 0634 01/19/2016 1944 Full Code [id number]  Etta Quill, DO ED   11/18/2015 2238 11/23/2015 1231 Full Code [id number]  Bonnielee Haff, MD Inpatient   11/09/2015 2048 11/12/2015 1535 Full Code [id number]  Toy Baker, MD Inpatient   11/09/2015 2048 11/09/2015 2048 Full Code [id number]  Toy Baker, MD Inpatient   11/03/2015 0238 11/06/2015 1301 Full Code [id number]  Jani Gravel, MD Inpatient   11/02/2015 0128 11/03/2015 0238 Full Code [id number]  Phillips Grout, MD Inpatient   07/10/2015 1645 07/13/2015 1431 Full Code [id number]  Ivor Costa, MD ED   07/01/2015 2203 07/06/2015 1522 Full Code [id number]  Gennaro Africa, MD ED   06/22/2015 0624 06/24/2015 1531 Full Code [id number]  Etta Quill, DO Inpatient   06/10/2015 0156 06/11/2015 1519 Full Code [id number]  Reubin Milan, MD Inpatient   05/28/2015 1609 06/03/2015 1932 Full Code [id number]  Barton Dubois, MD Inpatient   05/24/2015 2359 05/25/2015 1858 Full Code [id number]  Theressa Millard, MD Inpatient   04/21/2015 0931 04/23/2015 1445 Full Code [id number]  Gennaro Africa, MD ED   04/08/2015 2229 04/15/2015 1605 Full Code [id number]  Reubin Milan, MD ED   03/22/2015 0243 03/25/2015 1811 Full Code [id number]  Rise Patience, MD ED   02/11/2015 1530 02/12/2015 1805 Full Code [id number]  Greer Pickerel, MD Inpatient   11/24/2014 1309 11/29/2014 1929 Full Code [id number]  Rama, Venetia Maxon, MD  Inpatient   09/16/2014 0530 09/21/2014 2007 Full Code [id number]  Etta Quill, DO ED   06/29/2012 1400 07/01/2012 1517 Full Code [id number]  Velvet Bathe, MD Inpatient   12/08/2011 1543 12/12/2011 1450 Full Code [id number]  Chyrel Masson, RN Inpatient   Advance Care Planning Activity     Family Communication: Discussed in detail with patient Disposition Plan:   Patient will remain in the ICU on insulin drip for treatment of DKA.  Patient not yet ready or medically stable for discharge  consults called: None Admission status: Inpatient   Consultants:   None  Procedures:  US Abdomen Complete  Result Date: 11/04/2018 CLINICAL DATA:  Nausea and vomiting. EXAM: ABDOMEN ULTRASOUND COMPLETE COMPARISON:  CT 10/04/2018 FINDINGS: Gallbladder: Surgically absent. Common bile duct: Diameter: 3 mm. Liver: Subtle liver lesions on prior CT are not defined sonographically. Within normal limits in parenchymal echogenicity. Portal vein is patent on color Doppler imaging with normal direction of blood flow towards the liver. IVC: No abnormality visualized. Pancreas: Visualized portion unremarkable. Spleen: Size and appearance within normal  limits. Right Kidney: Length: 10.5 cm. Hydronephrosis on prior exam has resolved. Echogenicity within normal limits. Left Kidney: Length: 10.5 cm. Echogenicity within normal limits. No mass or hydronephrosis visualized. Abdominal aorta: No aneurysm visualized. Other findings: None. IMPRESSION: 1. Post cholecystectomy without biliary dilatation. 2. Resolved right hydronephrosis from CT last month. 3. Liver lesions on prior CT are not defined sonographically. Electronically Signed   By: Keith Rake M.D.   On: 11/04/2018 21:33   US Renal  Result Date: 11/07/2018 CLINICAL DATA:  Urinary retention, RIGHT-side pain, history type I diabetes mellitus, hypertension EXAM: RENAL / URINARY TRACT ULTRASOUND COMPLETE COMPARISON:  Renal ultrasound 10/06/2018 FINDINGS: Right Kidney:  Renal measurements: 11.7 x 5.1 x 5.0 cm = volume: 156 mL. Normal cortical thickness. Increased cortical echogenicity. Mild RIGHT hydronephrosis. Mild thickening of the wall of the renal pelvis, 3 mm thick. No renal mass or shadowing calcification. Left Kidney: Renal measurements: 10.0 x 5.3 x 4.4 cm = volume: 125 mL. Normal cortical thickness. Increased cortical echogenicity. No mass, hydronephrosis, or shadowing calcification. Bladder: Bladder wall appears diffusely thickened and irregular. Scattered debris within bladder. No focal bladder mass. Other: N/A IMPRESSION: Diffuse bladder wall thickening and irregularity, associated with significant debris within bladder, question cystitis; recommend correlation with urinalysis. Mild RIGHT hydronephrosis with thickening of the wall of the RIGHT renal pelvis, can be seen with pyelonephritis/chronic infection. Medical renal disease changes of both kidneys, suspect related to history of type I diabetes mellitus. Electronically Signed   By: Lavonia Dana M.D.   On: 11/07/2018 18:09   Dg Chest Portable 2 Views  Result Date: 11/14/2018 CLINICAL DATA:  Status post central line placement EXAM: PORTABLE CHEST 1 VIEW COMPARISON:  07/15/2016 FINDINGS: Cardiac shadows within normal limits. Right jugular central line is noted with catheter tip in the mid superior vena cava. No pneumothorax is seen. No focal infiltrate is noted. IMPRESSION: Noted thorax following central line placement is Electronically Signed   By: Inez Catalina M.D.   On: 11/14/2018 00:18   Dg Chest Port 1 View  Result Date: 11/14/2018 CLINICAL DATA:  Order for encounter for central line. EXAM: PORTABLE CHEST 1 VIEW COMPARISON:  Chest radiograph 11/14/2018 FINDINGS: Stable right central venous catheter with tip projecting over the mid SVC. Normal cardiomediastinal contours. The lungs are clear. No pneumothorax or large pleural effusion. No acute finding in the visualized skeleton. IMPRESSION: Stable  appearance of a right central venous catheter. Electronically Signed   By: Audie Pinto M.D.   On: 11/14/2018 09:48   [place] Ekg Site Rite  Result Date: 11/07/2018 If Site Rite image not attached, placement could not be confirmed due to current cardiac rhythm.    Antimicrobials:   None   Subjective: Significant improvement from admission Nursing staff reviewed insulin pump with patient noted needle for insertion was bent  Objective: Vitals:   11/14/18 0600 11/14/18 0630 11/14/18 0730 11/14/18 0830  BP: (!) 129/40 (!) 125/52  139/72  Pulse: (!) 108 (!) 110    Resp: 18 17    Temp:   98.6 F (37 C)   TempSrc:   Oral   SpO2: 99%     Weight:      Height:        Intake/Output Summary (Last 24 hours) at 11/14/2018 1202 Last data filed at 11/14/2018 0907 Gross per 24 hour  Intake 1042.76 ml  Output 1000 ml  Net 42.76 ml   Filed Weights   11/13/18 0943 11/13/18 2300  Weight: 59 kg 45.3  kg    Examination:  General exam: Appears calm and comfortable  Respiratory system: Clear to auscultation. Respiratory effort normal. Cardiovascular system: S1 & S2 heard, RRR. No JVD, murmurs, rubs, gallops or clicks. No pedal edema. Gastrointestinal system: Abdomen is nondistended, soft and nontender. No organomegaly or masses felt. Normal bowel sounds heard. Central nervous system: Alert and oriented. No focal neurological deficits. Extremities: Warm well perfused no edema skin: No rashes, lesions or ulcers Psychiatry: Judgement and insight are poor. Mood & affect flat.     Data Reviewed: I have personally reviewed following labs and imaging studies  CBC: Recent Labs  Lab 11/12/18 2341 11/13/18 1925 11/14/18 0443  WBC 10.4 18.1* 17.0*  NEUTROABS  --  15.2*  --   HGB 12.8 13.1 9.9*  HCT 39.7 40.2 32.0*  MCV 88.4 87.0 92.2  PLT 376 385 [id number]   Basic Metabolic Panel: Recent Labs  Lab 11/09/18 1755 11/09/18 2311 11/10/18 0245 11/10/18 0653  11/12/18 2341 11/13/18 1925  11/14/18 0043 11/14/18 0443 11/14/18 0942  NA 136 140 136 136   < > 139 134* 136 139 138  K 3.5 2.5* 3.9 3.4*   < > 3.0* 5.3* 4.2 4.0 4.2  CL 108 116* 107 104   < > 98 103 108 112* 110  CO2 22 19* 23 24   < > 29 12* 10* 16* 17*  GLUCOSE 123* 119* 163* 164*   < > 90 403* 351* 180* 120*  BUN <5* <5* <5* <5*   < > 8 9 13 14 15   CREATININE 0.44 0.35* 0.57 0.48   < > 0.84 0.91 0.97 0.92 0.87  CALCIUM 8.3* 6.5* 8.3* 8.5*   < > 10.0 8.8* 8.4* 8.6* 8.6*  MG 1.5* 1.2* 1.7 2.3  --   --  1.5*  --   --   --    < > = values in this interval not displayed.   GFR: Estimated Creatinine Clearance: 68.2 mL/min (by C-G formula based on SCr of 0.87 mg/dL). Liver Function Tests: Recent Labs  Lab 11/12/18 2341 11/13/18 1925  AST 17 21  ALT 10 12  ALKPHOS 107 93  BILITOT 0.5 1.0  PROT 8.5* 6.7  ALBUMIN 4.1 3.4*   Recent Labs  Lab 11/12/18 2341 11/13/18 1925  LIPASE 19 13   No results for input(s): AMMONIA in the last 168 hours. Coagulation Profile: No results for input(s): INR, PROTIME in the last 168 hours. Cardiac Enzymes: No results for input(s): CKTOTAL, CKMB, CKMBINDEX, TROPONINI in the last 168 hours. BNP (last 3 results) No results for input(s): PROBNP in the last 8760 hours. HbA1C: No results for input(s): HGBA1C in the last 72 hours. CBG: Recent Labs  Lab 11/14/18 0537 11/14/18 0643 11/14/18 0745 11/14/18 0905 11/14/18 1012  GLUCAP 154* 129* 108* 96 107*   Lipid Profile: No results for input(s): CHOL, HDL, LDLCALC, TRIG, CHOLHDL, LDLDIRECT in the last 72 hours. Thyroid Function Tests: No results for input(s): TSH, T4TOTAL, FREET4, T3FREE, THYROIDAB in the last 72 hours. Anemia Panel: No results for input(s): VITAMINB12, FOLATE, FERRITIN, TIBC, IRON, RETICCTPCT in the last 72 hours. Sepsis Labs: No results for input(s): PROCALCITON, LATICACIDVEN in the last 168 hours.  Recent Results (from the past 240 hour(s))  SARS CORONAVIRUS 2 (TAT 6-24 HRS) Nasopharyngeal  Nasopharyngeal Swab     Status: None   Collection Time: 11/04/18  8:14 PM   Specimen: Nasopharyngeal Swab  Result Value Ref Range Status   SARS Coronavirus 2 NEGATIVE NEGATIVE  Final    Comment: (NOTE) SARS-CoV-2 target nucleic acids are NOT DETECTED. The SARS-CoV-2 RNA is generally detectable in upper and lower respiratory specimens during the acute phase of infection. Negative results do not preclude SARS-CoV-2 infection, do not rule out co-infections with other pathogens, and should not be used as the sole basis for treatment or other patient management decisions. Negative results must be combined with clinical observations, patient history, and epidemiological information. The expected result is Negative. Fact Sheet for Patients: SugarRoll.be Fact Sheet for Healthcare Providers: https://www.woods-mathews.com/ This test is not yet approved or cleared by the [place] FDA and  has been authorized for detection and/or diagnosis of SARS-CoV-2 by FDA under an Emergency Use Authorization (EUA). This EUA will remain  in effect (meaning this test can be used) for the duration of the COVID-19 declaration under Section 56 4(b)(1) of the Act, 21 U.S.C. section 360bbb-3(b)(1), unless the authorization is terminated or revoked sooner. Performed at [facility], [place] [address], [place], [place] [address]   Culture, Urine     Status: Abnormal   Collection Time: 11/07/18  8:28 AM   Specimen: Urine, Random  Result Value Ref Range Status   Specimen Description   Final    URINE, RANDOM Performed at [facility] [address], [place], [place] [address]    Special Requests   Final    NONE Performed at [facility], [place] [address] [place], [place] [address]    Culture 40,000 COLONIES/mL YEAST (A)  Final   Report Status 11/08/2018 FINAL  Final  MRSA PCR Screening     Status: None   Collection Time:  11/07/18  7:56 PM   Specimen: Nasal Mucosa; Nasopharyngeal  Result Value Ref Range Status   MRSA by PCR NEGATIVE NEGATIVE Final    Comment:        The GeneXpert MRSA Assay (FDA approved for NASAL specimens only), is one component of a comprehensive MRSA colonization surveillance program. It is not intended to diagnose MRSA infection nor to guide or monitor treatment for MRSA infections. Performed at [facility], [place] [address], [place], [place] [address]   Culture, blood (routine x 2)     Status: None   Collection Time: 11/08/18  2:25 PM   Specimen: BLOOD  Result Value Ref Range Status   Specimen Description   Final    BLOOD LEFT ARM Performed at [facility] [address], [place], [place] [address]    Special Requests   Final    BOTTLES DRAWN AEROBIC ONLY Blood Culture results may not be optimal due to an inadequate volume of blood received in culture bottles Performed at [facility] [address] [place], [place] [address]    Culture   Final    NO GROWTH 5 DAYS Performed at [facility], [place] [address] [place], [place] [address]    Report Status 11/13/2018 FINAL  Final  Culture, blood (routine x 2)     Status: None   Collection Time: 11/08/18  2:25 PM   Specimen: BLOOD  Result Value Ref Range Status   Specimen Description   Final    BLOOD LEFT ARM Performed at [facility] [address] [place], [place] [address]    Special Requests   Final    BOTTLES DRAWN AEROBIC ONLY Blood Culture results may not be optimal due to an inadequate volume of blood received in culture bottles  Performed at [facility], [place] [address] [place], [place] [address]    Culture   Final    NO GROWTH 5 DAYS Performed at [facility], [place] [address] [place], [place] [address]    Report Status 11/13/2018 FINAL  Final  SARS CORONAVIRUS 2 (TAT 6-24 HRS) Nasopharyngeal  Nasopharyngeal Swab     Status: None   Collection Time: 11/13/18  4:59 PM   Specimen: Nasopharyngeal Swab  Result Value Ref Range Status   SARS Coronavirus 2 NEGATIVE NEGATIVE Final    Comment: (NOTE) SARS-CoV-2 target nucleic acids are NOT DETECTED. The SARS-CoV-2 RNA is generally detectable in upper and lower respiratory specimens during the acute phase of infection. Negative results do not preclude SARS-CoV-2 infection, do not rule out co-infections with other pathogens, and should not be used as the sole basis for treatment or other patient management decisions. Negative results must be combined with clinical observations, patient history, and epidemiological information. The expected result is Negative. Fact Sheet for Patients: SugarRoll.be Fact Sheet for Healthcare Providers: https://www.woods-mathews.com/ This test is not yet approved or cleared by the [place] FDA and  has been authorized for detection and/or diagnosis of SARS-CoV-2 by FDA under an Emergency Use Authorization (EUA). This EUA will remain  in effect (meaning this test can be used) for the duration of the COVID-19 declaration under Section 56 4(b)(1) of the Act, 21 U.S.C. section 360bbb-3(b)(1), unless the authorization is terminated or revoked sooner. Performed at [facility], [place] [address], [place], [place] [address]          Radiology Studies: Dg Chest Portable 2 Views  Result Date: 11/14/2018 CLINICAL DATA:  Status post central line placement EXAM: PORTABLE CHEST 1 VIEW COMPARISON:  07/15/2016 FINDINGS: Cardiac shadows within normal limits. Right jugular central line is noted with catheter tip in the mid superior vena cava. No pneumothorax is seen. No focal infiltrate is noted. IMPRESSION: Noted thorax following central line placement is Electronically Signed   By: Inez Catalina M.D.   On: 11/14/2018 00:18   Dg Chest Port 1 View  Result Date:  11/14/2018 CLINICAL DATA:  Order for encounter for central line. EXAM: PORTABLE CHEST 1 VIEW COMPARISON:  Chest radiograph 11/14/2018 FINDINGS: Stable right central venous catheter with tip projecting over the mid SVC. Normal cardiomediastinal contours. The lungs are clear. No pneumothorax or large pleural effusion. No acute finding in the visualized skeleton. IMPRESSION: Stable appearance of a right central venous catheter. Electronically Signed   By: Audie Pinto M.D.   On: 11/14/2018 09:48        Scheduled Meds: . Chlorhexidine Gluconate Cloth  6 each Topical Daily  . heparin  5,000 Units Subcutaneous Q8H  . LORazepam  1 mg Intravenous Once  . metoCLOPramide (REGLAN) injection  10 mg Intravenous Q6H  . sodium chloride flush  10-40 mL Intracatheter Q12H   Continuous Infusions: . sodium chloride Stopped (11/14/18 0231)  . dextrose 5 % and 0.45% NaCl 50 mL/hr at 11/14/18 0907  . fluconazole (DIFLUCAN) IV Stopped (11/14/18 0430)  . insulin pump    . insulin 0.5 Units/hr (11/14/18 1128)     LOS: 1 day    Time spent: 84 min    Nicolette Bang, MD [facility]  If 7PM-7AM, please contact night-coverage  11/14/2018, 12:02 PM

## 2018-11-15 ENCOUNTER — Inpatient Hospital Stay (HOSPITAL_COMMUNITY): Payer: Medicaid Other

## 2018-11-15 DIAGNOSIS — M7989 Other specified soft tissue disorders: Secondary | ICD-10-CM | POA: Diagnosis not present

## 2018-11-15 DIAGNOSIS — M79609 Pain in unspecified limb: Secondary | ICD-10-CM | POA: Diagnosis not present

## 2018-11-15 LAB — BASIC METABOLIC PANEL
Anion gap: 5 (ref 5–15)
Anion gap: 6 (ref 5–15)
Anion gap: 13 (ref 5–15)
Anion gap: 12 (ref 5–15)
Anion gap: 10 (ref 5–15)
Anion gap: 8 (ref 5–15)
BUN: 11 mg/dL (ref 6–20)
BUN: 7 mg/dL (ref 6–20)
BUN: 5 mg/dL — ABNORMAL LOW (ref 6–20)
BUN: <5 mg/dL — ABNORMAL LOW (ref 6–20)
BUN: <5 mg/dL — ABNORMAL LOW (ref 6–20)
BUN: <5 mg/dL — ABNORMAL LOW (ref 6–20)
CO2: 18 mmol/L — ABNORMAL LOW (ref 22–32)
CO2: 18 mmol/L — ABNORMAL LOW (ref 22–32)
CO2: 16 mmol/L — ABNORMAL LOW (ref 22–32)
CO2: 18 mmol/L — ABNORMAL LOW (ref 22–32)
CO2: 20 mmol/L — ABNORMAL LOW (ref 22–32)
CO2: 21 mmol/L — ABNORMAL LOW (ref 22–32)
Calcium: 7.3 mg/dL — ABNORMAL LOW (ref 8.9–10.3)
Calcium: 7.5 mg/dL — ABNORMAL LOW (ref 8.9–10.3)
Calcium: 8.3 mg/dL — ABNORMAL LOW (ref 8.9–10.3)
Calcium: 8.4 mg/dL — ABNORMAL LOW (ref 8.9–10.3)
Calcium: 8.7 mg/dL — ABNORMAL LOW (ref 8.9–10.3)
Calcium: 8.1 mg/dL — ABNORMAL LOW (ref 8.9–10.3)
Chloride: 108 mmol/L (ref 98–111)
Chloride: 111 mmol/L (ref 98–111)
Chloride: 104 mmol/L (ref 98–111)
Chloride: 104 mmol/L (ref 98–111)
Chloride: 104 mmol/L (ref 98–111)
Chloride: 105 mmol/L (ref 98–111)
Creatinine, Ser: 0.64 mg/dL (ref 0.44–1.00)
Creatinine, Ser: 0.52 mg/dL (ref 0.44–1.00)
Creatinine, Ser: 0.62 mg/dL (ref 0.44–1.00)
Creatinine, Ser: 0.56 mg/dL (ref 0.44–1.00)
Creatinine, Ser: 0.52 mg/dL (ref 0.44–1.00)
Creatinine, Ser: 0.51 mg/dL (ref 0.44–1.00)
GFR calc Af Amer: >60 mL/min (ref >60)
GFR calc Af Amer: >60 mL/min (ref >60)
GFR calc Af Amer: >60 mL/min (ref >60)
GFR calc Af Amer: >60 mL/min (ref >60)
GFR calc Af Amer: >60 mL/min (ref >60)
GFR calc Af Amer: >60 mL/min (ref >60)
GFR calc non Af Amer: >60 mL/min (ref >60)
GFR calc non Af Amer: >60 mL/min (ref >60)
GFR calc non Af Amer: >60 mL/min (ref >60)
GFR calc non Af Amer: >60 mL/min (ref >60)
GFR calc non Af Amer: >60 mL/min (ref >60)
GFR calc non Af Amer: >60 mL/min (ref >60)
Glucose, Bld: 191 mg/dL — ABNORMAL HIGH (ref 70–99)
Glucose, Bld: 130 mg/dL — ABNORMAL HIGH (ref 70–99)
Glucose, Bld: 262 mg/dL — ABNORMAL HIGH (ref 70–99)
Glucose, Bld: 187 mg/dL — ABNORMAL HIGH (ref 70–99)
Glucose, Bld: 165 mg/dL — ABNORMAL HIGH (ref 70–99)
Glucose, Bld: 148 mg/dL — ABNORMAL HIGH (ref 70–99)
Potassium: 3.7 mmol/L (ref 3.5–5.1)
Potassium: 3.9 mmol/L (ref 3.5–5.1)
Potassium: 4 mmol/L (ref 3.5–5.1)
Potassium: 3.9 mmol/L (ref 3.5–5.1)
Potassium: 3.6 mmol/L (ref 3.5–5.1)
Potassium: 3.4 mmol/L — ABNORMAL LOW (ref 3.5–5.1)
Sodium: 131 mmol/L — ABNORMAL LOW (ref 135–145)
Sodium: 135 mmol/L (ref 135–145)
Sodium: 133 mmol/L — ABNORMAL LOW (ref 135–145)
Sodium: 134 mmol/L — ABNORMAL LOW (ref 135–145)
Sodium: 134 mmol/L — ABNORMAL LOW (ref 135–145)
Sodium: 134 mmol/L — ABNORMAL LOW (ref 135–145)

## 2018-11-15 LAB — GLUCOSE, CAPILLARY
Glucose-Capillary: 128 mg/dL — ABNORMAL HIGH (ref 70–99)
Glucose-Capillary: 128 mg/dL — ABNORMAL HIGH (ref 70–99)
Glucose-Capillary: 130 mg/dL — ABNORMAL HIGH (ref 70–99)
Glucose-Capillary: 131 mg/dL — ABNORMAL HIGH (ref 70–99)
Glucose-Capillary: 139 mg/dL — ABNORMAL HIGH (ref 70–99)
Glucose-Capillary: 140 mg/dL — ABNORMAL HIGH (ref 70–99)
Glucose-Capillary: 146 mg/dL — ABNORMAL HIGH (ref 70–99)
Glucose-Capillary: 149 mg/dL — ABNORMAL HIGH (ref 70–99)
Glucose-Capillary: 149 mg/dL — ABNORMAL HIGH (ref 70–99)
Glucose-Capillary: 151 mg/dL — ABNORMAL HIGH (ref 70–99)
Glucose-Capillary: 152 mg/dL — ABNORMAL HIGH (ref 70–99)
Glucose-Capillary: 158 mg/dL — ABNORMAL HIGH (ref 70–99)
Glucose-Capillary: 162 mg/dL — ABNORMAL HIGH (ref 70–99)
Glucose-Capillary: 162 mg/dL — ABNORMAL HIGH (ref 70–99)
Glucose-Capillary: 169 mg/dL — ABNORMAL HIGH (ref 70–99)
Glucose-Capillary: 177 mg/dL — ABNORMAL HIGH (ref 70–99)
Glucose-Capillary: 179 mg/dL — ABNORMAL HIGH (ref 70–99)
Glucose-Capillary: 179 mg/dL — ABNORMAL HIGH (ref 70–99)
Glucose-Capillary: 181 mg/dL — ABNORMAL HIGH (ref 70–99)
Glucose-Capillary: 212 mg/dL — ABNORMAL HIGH (ref 70–99)
Glucose-Capillary: 225 mg/dL — ABNORMAL HIGH (ref 70–99)
Glucose-Capillary: 240 mg/dL — ABNORMAL HIGH (ref 70–99)

## 2018-11-15 MED ORDER — MORPHINE SULFATE (PF) 2 MG/ML IV SOLN
2.0000 mg | INTRAVENOUS | Status: DC | PRN
  Administered 2018-11-15 – 2018-11-17 (×11): 2 mg via INTRAVENOUS
  Filled 2018-11-15 (×11): qty 1

## 2018-11-15 MED ORDER — LORAZEPAM 2 MG/ML PO CONC: 1.0000 mg | ORAL | Status: DC | PRN

## 2018-11-15 MED ORDER — PROMETHAZINE HCL 25 MG/ML IJ SOLN
12.5000 mg | Freq: Four times a day (QID) | INTRAMUSCULAR | Status: DC | PRN
  Administered 2018-11-15 – 2018-11-16 (×4): 12.5 mg via INTRAVENOUS
  Filled 2018-11-15 (×5): qty 1

## 2018-11-15 MED ORDER — SODIUM CHLORIDE 0.9 % IV BOLUS
1000.0000 mL | Freq: Once | INTRAVENOUS | Status: AC
  Administered 2018-11-15: 1000 mL via INTRAVENOUS

## 2018-11-15 MED ORDER — POTASSIUM CHLORIDE 10 MEQ/100ML IV SOLN
10.0000 meq | INTRAVENOUS | Status: AC
  Administered 2018-11-15 (×2): 10 meq via INTRAVENOUS
  Filled 2018-11-15: qty 100

## 2018-11-15 NOTE — Progress Notes (Signed)
Left upper extremity venous duplex has been completed. Preliminary results can be found in CV Proc through chart review.  Results were given to the patient's nurse, Mel Almond.  11/15/18 8:48 AM [name] RVT

## 2018-11-15 NOTE — Progress Notes (Signed)
PROGRESS NOTE    [name]  [id number] DOB: 1990-12-06 DOA: 11/13/2018 PCP: Vicenta Aly, FNP   Brief Narrative:  Per admitting HPI: Stefanie Moreheadis a 28 y.o.femalewith medical history significant forIDDM on insulin pump recently discharged 10/23after treatment for DKA, intractable nausea and vomiting with diabetic gastroparesis and electrolyte abnormalities. Patient represents to the ER today with recurrent nausea and vomiting. Patient was having significant emesis witnessed in the emergency room. Patient reports he felt stable when she was discharged home 2 days ago but recurred again today. She reports abdominal pain. With vomiting that started began progressively worse today. Nothing is made it better and patient continues to have vomiting.  Later in the evening patient's labs came back consistent with DKA.  Cross cover with the patient in stepdown with central line placement and insulin drip   Assessment & Plan:   Active Problems:   Intractable nausea and vomiting   Diabetes mellitus (HCC)   HTN (hypertension)   Marijuana abuse   Gastroparesis: Patient with a long history of gastroparesis with intractable nausea and vomiting.   She was treated with antiemetics, aggressively fluid resuscitated, patient was improving, unfortunately patient with recurrence of nausea and vomiting this morning  DKA with insulin-dependent diabetes initially patient was admitted with previous labs not showing any evidence of DKA.  Follow-up labs in the evening did show DKA with a gap and Acidosis.  Patient was placed on insulin drip transferred down to the stepdown unit.    Patient still remains acidotic, remains on insulin drip per protocol, follow BMPs titrate off per protocol, restart home insulin once patient becomes stable with nausea and vomiting controlled  Hypertension we will continue to hold patient's antihypertensive. Currently she is on low-dose lisinopril 2.5  mg given patient's volume loss will continue to hold for now  Marijuana use. Hyperemesis, differential for marijuana use. Patient denies any recent use,  UDS negative for marijuana   DVT prophylaxis: Heparin SQ  Code Status: Full    Code Status Orders  (From admission, onward)         Start     Ordered   11/13/18 1913  Full code  Continuous     11/13/18 1912        Code Status History    Date Active Date Inactive Code Status Order ID Comments User Context   11/07/2018 1647 11/11/2018 1922 Full Code [id number]  Louellen Molder, MD Inpatient   11/04/2018 2355 11/07/2018 1647 Full Code [id number]  Rise Patience, MD Inpatient   10/06/2018 1127 10/16/2018 1441 Full Code [id number]  Jonnie Finner, DO ED   10/04/2018 2002 10/05/2018 1405 Full Code [id number]  Orene Desanctis, DO ED   12/22/2017 1757 12/27/2017 2015 Full Code [id number]  Tawni Millers, MD Inpatient   12/19/2017 1737 12/21/2017 1403 Full Code [id number]  Cristal Deer, MD Inpatient   12/08/2017 1340 12/11/2017 1618 Full Code [id number]  Damita Lack, MD ED   11/29/2017 0635 12/07/2017 1840 Full Code [id number]  Rise Patience, MD Inpatient   11/15/2017 0503 11/21/2017 1747 Full Code [id number]  Rise Patience, MD Inpatient   11/02/2017 1757 11/07/2017 1354 Full Code [id number]  Charlynne Cousins, MD Inpatient   01/21/2017 2304 01/29/2017 1728 Full Code [id number]  Rise Patience, MD ED   12/18/2016 0704 12/20/2016 1727 Full Code [id number]  Rondel Jumbo, PA-C ED   11/05/2016 1525 11/11/2016 1456 Full Code [id number]  Phillips Grout, MD Inpatient   10/23/2016 1342 10/29/2016  2103 Full Code [id number]  Elwin Mocha, MD ED   09/26/2016 1503 09/29/2016 1533 Full Code [id number]  Georgette Shell, MD ED   07/15/2016 1712 07/18/2016 2035 Full Code [id number]  Damita Lack, MD Inpatient   07/05/2016 1658 07/11/2016 1620 Full Code [id number]  Bonnielee Haff, MD Inpatient   06/23/2016 0856 06/28/2016  1346 Full Code [id number]  Janece Canterbury, MD Inpatient   06/12/2016 2137 06/14/2016 1723 Full Code [id number]  Sid Falcon, MD Inpatient   06/03/2016 2044 06/06/2016 2041 Full Code [id number]  Gardiner Barefoot, NP Inpatient   05/31/2016 2348 06/03/2016 2044 Full Code [id number]  Rise Patience, MD Inpatient   05/14/2016 0212 05/16/2016 1718 Full Code [id number]  Toy Baker, MD Inpatient   05/04/2016 2140 05/10/2016 2004 Full Code [id number]  Orson Eva, MD Inpatient   02/29/2016 2201 03/04/2016 1646 Full Code [id number]  Toy Baker, MD Inpatient   02/20/2016 1441 02/25/2016 1723 Full Code [id number]  Elgergawy, Silver Huguenin, MD Inpatient   01/22/2016 1021 01/25/2016 1607 Full Code [id number]  Orson Eva, MD Inpatient   01/17/2016 0634 01/19/2016 1944 Full Code [id number]  Etta Quill, DO ED   11/18/2015 2238 11/23/2015 1231 Full Code [id number]  Bonnielee Haff, MD Inpatient   11/09/2015 2048 11/12/2015 1535 Full Code [id number]  Toy Baker, MD Inpatient   11/09/2015 2048 11/09/2015 2048 Full Code [id number]  Toy Baker, MD Inpatient   11/03/2015 0238 11/06/2015 1301 Full Code [id number]  Jani Gravel, MD Inpatient   11/02/2015 0128 11/03/2015 0238 Full Code [id number]  Phillips Grout, MD Inpatient   07/10/2015 1645 07/13/2015 1431 Full Code [id number]  Ivor Costa, MD ED   07/01/2015 2203 07/06/2015 1522 Full Code [id number]  Gennaro Africa, MD ED   06/22/2015 0624 06/24/2015 1531 Full Code [id number]  Etta Quill, DO Inpatient   06/10/2015 0156 06/11/2015 1519 Full Code [id number]  Reubin Milan, MD Inpatient   05/28/2015 1609 06/03/2015 1932 Full Code [id number]  Barton Dubois, MD Inpatient   05/24/2015 2359 05/25/2015 1858 Full Code [id number]  Theressa Millard, MD Inpatient   04/21/2015 0931 04/23/2015 1445 Full Code [id number]  Gennaro Africa, MD ED   04/08/2015 2229 04/15/2015 1605 Full Code [id number]  Reubin Milan, MD ED   03/22/2015 0243 03/25/2015 1811 Full Code [id number]   Rise Patience, MD ED   02/11/2015 1530 02/12/2015 1805 Full Code [id number]  Greer Pickerel, MD Inpatient   11/24/2014 1309 11/29/2014 1929 Full Code [id number]  Rama, Venetia Maxon, MD Inpatient   09/16/2014 0530 09/21/2014 2007 Full Code [id number]  Etta Quill, DO ED   06/29/2012 1400 07/01/2012 1517 Full Code [id number]  Velvet Bathe, MD Inpatient   12/08/2011 1543 12/12/2011 1450 Full Code [id number]  Chyrel Masson, RN Inpatient   Advance Care Planning Activity     Family Communication: Discussed in detail with patient Disposition Plan: Patient with uncontrolled nausea and vomiting in the setting of gastroparesis and metabolic acidosis in the setting of DKA slow to improve.  Patient not yet ready for discharge   Consults called: None Admission status: Inpatient   Consultants:   None  Procedures:  US Abdomen Complete  Result Date: 11/04/2018 CLINICAL DATA:  Nausea and vomiting. EXAM: ABDOMEN ULTRASOUND COMPLETE COMPARISON:  CT 10/04/2018 FINDINGS: Gallbladder: Surgically absent. Common bile duct: Diameter: 3 mm. Liver: Subtle liver lesions on prior CT are not defined sonographically. Within normal limits in parenchymal echogenicity. Portal vein is patent on color Doppler imaging with normal direction  of blood flow towards the liver. IVC: No abnormality visualized. Pancreas: Visualized portion unremarkable. Spleen: Size and appearance within normal limits. Right Kidney: Length: 10.5 cm. Hydronephrosis on prior exam has resolved. Echogenicity within normal limits. Left Kidney: Length: 10.5 cm. Echogenicity within normal limits. No mass or hydronephrosis visualized. Abdominal aorta: No aneurysm visualized. Other findings: None. IMPRESSION: 1. Post cholecystectomy without biliary dilatation. 2. Resolved right hydronephrosis from CT last month. 3. Liver lesions on prior CT are not defined sonographically. Electronically Signed   By: Keith Rake M.D.   On: 11/04/2018 21:33   US  Renal  Result Date: 11/07/2018 CLINICAL DATA:  Urinary retention, RIGHT-side pain, history type I diabetes mellitus, hypertension EXAM: RENAL / URINARY TRACT ULTRASOUND COMPLETE COMPARISON:  Renal ultrasound 10/06/2018 FINDINGS: Right Kidney: Renal measurements: 11.7 x 5.1 x 5.0 cm = volume: 156 mL. Normal cortical thickness. Increased cortical echogenicity. Mild RIGHT hydronephrosis. Mild thickening of the wall of the renal pelvis, 3 mm thick. No renal mass or shadowing calcification. Left Kidney: Renal measurements: 10.0 x 5.3 x 4.4 cm = volume: 125 mL. Normal cortical thickness. Increased cortical echogenicity. No mass, hydronephrosis, or shadowing calcification. Bladder: Bladder wall appears diffusely thickened and irregular. Scattered debris within bladder. No focal bladder mass. Other: N/A IMPRESSION: Diffuse bladder wall thickening and irregularity, associated with significant debris within bladder, question cystitis; recommend correlation with urinalysis. Mild RIGHT hydronephrosis with thickening of the wall of the RIGHT renal pelvis, can be seen with pyelonephritis/chronic infection. Medical renal disease changes of both kidneys, suspect related to history of type I diabetes mellitus. Electronically Signed   By: Lavonia Dana M.D.   On: 11/07/2018 18:09   Dg Chest Portable 2 Views  Result Date: 11/14/2018 CLINICAL DATA:  Status post central line placement EXAM: PORTABLE CHEST 1 VIEW COMPARISON:  07/15/2016 FINDINGS: Cardiac shadows within normal limits. Right jugular central line is noted with catheter tip in the mid superior vena cava. No pneumothorax is seen. No focal infiltrate is noted. IMPRESSION: Noted thorax following central line placement is Electronically Signed   By: Inez Catalina M.D.   On: 11/14/2018 00:18   Dg Chest Port 1 View  Result Date: 11/14/2018 CLINICAL DATA:  Order for encounter for central line. EXAM: PORTABLE CHEST 1 VIEW COMPARISON:  Chest radiograph 11/14/2018 FINDINGS:  Stable right central venous catheter with tip projecting over the mid SVC. Normal cardiomediastinal contours. The lungs are clear. No pneumothorax or large pleural effusion. No acute finding in the visualized skeleton. IMPRESSION: Stable appearance of a right central venous catheter. Electronically Signed   By: Audie Pinto M.D.   On: 11/14/2018 09:48   Vas [place] Upper Extremity Venous Duplex  Result Date: 11/15/2018 UPPER VENOUS STUDY  Indications: Pain, and Swelling Risk Factors: None identified. Limitations: Poor ultrasound/tissue interface. Comparison Study: No prior studies. Performing Technologist: Oliver Hum RVT  Examination Guidelines: A complete evaluation includes B-mode imaging, spectral Doppler, color Doppler, and power Doppler as needed of all accessible portions of each vessel. Bilateral testing is considered an integral part of a complete examination. Limited examinations for reoccurring indications may be performed as noted.  Right Findings: +----------+------------+---------+-----------+----------+-------+  RIGHT      Compressible Phasicity Spontaneous Properties Summary  +----------+------------+---------+-----------+----------+-------+  Subclavian     Full        Yes        Yes                         +----------+------------+---------+-----------+----------+-------+  Left Findings: +----------+------------+---------+-----------+----------+-----------------+  LEFT       Compressible Phasicity Spontaneous Properties      Summary       +----------+------------+---------+-----------+----------+-----------------+  IJV            Full        Yes        Yes                                   +----------+------------+---------+-----------+----------+-----------------+  Subclavian     Full        Yes        Yes                                   +----------+------------+---------+-----------+----------+-----------------+  Axillary       Full        Yes        Yes                                    +----------+------------+---------+-----------+----------+-----------------+  Brachial       Full        Yes        Yes                                   +----------+------------+---------+-----------+----------+-----------------+  Radial         Full                                                         +----------+------------+---------+-----------+----------+-----------------+  Ulnar          Full                                                         +----------+------------+---------+-----------+----------+-----------------+  Cephalic       None                                      Age Indeterminate  +----------+------------+---------+-----------+----------+-----------------+  Basilic        Full                                                         +----------+------------+---------+-----------+----------+-----------------+  Summary:  Right: No evidence of thrombosis in the subclavian.  Left: No evidence of deep vein thrombosis in the upper extremity. Findings consistent with age indeterminate superficial vein thrombosis involving the left cephalic vein.  *See table(s) above for measurements and observations.  Diagnosing physician: Deitra Mayo MD Electronically signed by Deitra Mayo MD on 11/15/2018 at 8:56:14 AM.    Final    [place] Ekg Site Rite  Result  Date: 11/07/2018 If Site Rite image not attached, placement could not be confirmed due to current cardiac rhythm.    Antimicrobials:   none    Subjective: Patient with no nausea and vomiting this morning, witnessed by staff and myself Reported recurrence of abdominal pain  Objective: Vitals:   11/15/18 0334 11/15/18 0400 11/15/18 0800 11/15/18 1200  BP:  107/65  (!) 161/89  Pulse:  87  (!) 118  Resp:  18  (!) 22  Temp: 98.6 F (37 C)  99 F (37.2 C) 99 F (37.2 C)  TempSrc: Oral  Oral Oral  SpO2:  100%  100%  Weight:      Height:        Intake/Output Summary (Last 24 hours) at 11/15/2018 1307 Last data  filed at 11/15/2018 1200 Gross per 24 hour  Intake 3933.06 ml  Output 3175 ml  Net 758.06 ml   Filed Weights   11/13/18 0943 11/13/18 2300  Weight: 59 kg 45.3 kg    Examination:  General exam: Appears calm but uncomfortable with abdominal pain Respiratory system: Clear to auscultation. Respiratory effort normal. Cardiovascular system: S1 & S2 heard, RRR. No JVD, murmurs, rubs, gallops or clicks. No pedal edema. Gastrointestinal system: Limited exam secondary to vomiting, not distended soft tender to palpation diffusely no rebound no guarding Central nervous system: Alert and oriented. No focal neurological deficits. Extremities: Warm well perfused, moves all 4 extremities freely, neurovascularly intact Skin: No rashes, lesions or ulcers Psychiatry: Judgement and insight appear normal. Mood & affect appropriate.     Data Reviewed: I have personally reviewed following labs and imaging studies  CBC: Recent Labs  Lab 11/12/18 2341 11/13/18 1925 11/14/18 0443  WBC 10.4 18.1* 17.0*  NEUTROABS  --  15.2*  --   HGB 12.8 13.1 9.9*  HCT 39.7 40.2 32.0*  MCV 88.4 87.0 92.2  PLT 376 385 [id number]   Basic Metabolic Panel: Recent Labs  Lab 11/09/18 1755 11/09/18 2311 11/10/18 0245 11/10/18 0653  11/13/18 1925  11/14/18 1724 11/14/18 2127 11/15/18 0128 11/15/18 0522 11/15/18 0822  NA 136 140 136 136   < > 134*   < > 134* 131* 131* 135 133*  K 3.5 2.5* 3.9 3.4*   < > 5.3*   < > 3.6 3.5 3.7 3.9 4.0  CL 108 116* 107 104   < > 103   < > 107 105 108 111 104  CO2 22 19* 23 24   < > 12*   < > 18* 16* 18* 18* 16*  GLUCOSE 123* 119* 163* 164*   < > 403*   < > 152* 180* 191* 130* 262*  BUN <5* <5* <5* <5*   < > 9   < > 13 11 11 7  5*  CREATININE 0.44 0.35* 0.57 0.48   < > 0.91   < > 0.76 0.65 0.64 0.52 0.62  CALCIUM 8.3* 6.5* 8.3* 8.5*   < > 8.8*   < > 8.3* 7.8* 7.3* 7.5* 8.3*  MG 1.5* 1.2* 1.7 2.3  --  1.5*  --   --   --   --   --   --    < > = values in this interval not displayed.    GFR: Estimated Creatinine Clearance: 74.2 mL/min (by C-G formula based on SCr of 0.62 mg/dL). Liver Function Tests: Recent Labs  Lab 11/12/18 2341 11/13/18 1925  AST 17 21  ALT 10 12  ALKPHOS 107 93  BILITOT 0.5 1.0  PROT 8.5* 6.7  ALBUMIN 4.1 3.4*   Recent Labs  Lab 11/12/18 2341 11/13/18 1925  LIPASE 19 13   No results for input(s): AMMONIA in the last 168 hours. Coagulation Profile: No results for input(s): INR, PROTIME in the last 168 hours. Cardiac Enzymes: No results for input(s): CKTOTAL, CKMB, CKMBINDEX, TROPONINI in the last 168 hours. BNP (last 3 results) No results for input(s): PROBNP in the last 8760 hours. HbA1C: No results for input(s): HGBA1C in the last 72 hours. CBG: Recent Labs  Lab 11/15/18 0848 11/15/18 0937 11/15/18 1047 11/15/18 1145 11/15/18 1243  GLUCAP 225* 240* 177* 158* 151*   Lipid Profile: No results for input(s): CHOL, HDL, LDLCALC, TRIG, CHOLHDL, LDLDIRECT in the last 72 hours. Thyroid Function Tests: No results for input(s): TSH, T4TOTAL, FREET4, T3FREE, THYROIDAB in the last 72 hours. Anemia Panel: No results for input(s): VITAMINB12, FOLATE, FERRITIN, TIBC, IRON, RETICCTPCT in the last 72 hours. Sepsis Labs: No results for input(s): PROCALCITON, LATICACIDVEN in the last 168 hours.  Recent Results (from the past 240 hour(s))  Culture, Urine     Status: Abnormal   Collection Time: 11/07/18  8:28 AM   Specimen: Urine, Random  Result Value Ref Range Status   Specimen Description   Final    URINE, RANDOM Performed at [facility] [address] [place], [place] [address]    Special Requests   Final    NONE Performed at [facility], [employer] [address] [place], [place] [address]    Culture 40,000 COLONIES/mL YEAST (A)  Final   Report Status 11/08/2018 FINAL  Final  MRSA PCR Screening     Status: None   Collection Time: 11/07/18  7:56 PM   Specimen: Nasal Mucosa; Nasopharyngeal   Result Value Ref Range Status   MRSA by PCR NEGATIVE NEGATIVE Final    Comment:        The GeneXpert MRSA Assay (FDA approved for NASAL specimens only), is one component of a comprehensive MRSA colonization surveillance program. It is not intended to diagnose MRSA infection nor to guide or monitor treatment for MRSA infections. Performed at [facility], [place] [address], [place], [place] [address]   Culture, blood (routine x 2)     Status: None   Collection Time: 11/08/18  2:25 PM   Specimen: BLOOD  Result Value Ref Range Status   Specimen Description   Final    BLOOD LEFT ARM Performed at [facility] [address], [place], [place] [address]    Special Requests   Final    BOTTLES DRAWN AEROBIC ONLY Blood Culture results may not be optimal due to an inadequate volume of blood received in culture bottles Performed at [facility] [address], [place], [place] [address]    Culture   Final    NO GROWTH 5 DAYS Performed at [facility], [place] [address], [place], [place] [address]    Report Status 11/13/2018 FINAL  Final  Culture, blood (routine x 2)     Status: None   Collection Time: 11/08/18  2:25 PM   Specimen: BLOOD  Result Value Ref Range Status   Specimen Description   Final    BLOOD LEFT ARM Performed at [facility] [address] [place], [place] [address]    Special Requests   Final    BOTTLES DRAWN AEROBIC ONLY Blood Culture results may not be optimal due to an inadequate volume  of blood received in culture bottles Performed at [facility], [place] [address], [place], [place] [address]    Culture   Final    NO GROWTH 5 DAYS Performed at [facility], [place] [address] [place], [place] [address]    Report Status 11/13/2018 FINAL  Final  SARS CORONAVIRUS 2 (TAT 6-24 HRS) Nasopharyngeal Nasopharyngeal Swab     Status: None   Collection Time: 11/13/18   4:59 PM   Specimen: Nasopharyngeal Swab  Result Value Ref Range Status   SARS Coronavirus 2 NEGATIVE NEGATIVE Final    Comment: (NOTE) SARS-CoV-2 target nucleic acids are NOT DETECTED. The SARS-CoV-2 RNA is generally detectable in upper and lower respiratory specimens during the acute phase of infection. Negative results do not preclude SARS-CoV-2 infection, do not rule out co-infections with other pathogens, and should not be used as the sole basis for treatment or other patient management decisions. Negative results must be combined with clinical observations, patient history, and epidemiological information. The expected result is Negative. Fact Sheet for Patients: SugarRoll.be Fact Sheet for Healthcare Providers: https://www.woods-mathews.com/ This test is not yet approved or cleared by the [place] FDA and  has been authorized for detection and/or diagnosis of SARS-CoV-2 by FDA under an Emergency Use Authorization (EUA). This EUA will remain  in effect (meaning this test can be used) for the duration of the COVID-19 declaration under Section 56 4(b)(1) of the Act, 21 U.S.C. section 360bbb-3(b)(1), unless the authorization is terminated or revoked sooner. Performed at [facility], [place] [address], [place],  [address]          Radiology Studies: Dg Chest Portable 2 Views  Result Date: 11/14/2018 CLINICAL DATA:  Status post central line placement EXAM: PORTABLE CHEST 1 VIEW COMPARISON:  07/15/2016 FINDINGS: Cardiac shadows within normal limits. Right jugular central line is noted with catheter tip in the mid superior vena cava. No pneumothorax is seen. No focal infiltrate is noted. IMPRESSION: Noted thorax following central line placement is Electronically Signed   By: Inez Catalina M.D.   On: 11/14/2018 00:18   Dg Chest Port 1 View  Result Date: 11/14/2018 CLINICAL DATA:  Order for encounter for central line.  EXAM: PORTABLE CHEST 1 VIEW COMPARISON:  Chest radiograph 11/14/2018 FINDINGS: Stable right central venous catheter with tip projecting over the mid SVC. Normal cardiomediastinal contours. The lungs are clear. No pneumothorax or large pleural effusion. No acute finding in the visualized skeleton. IMPRESSION: Stable appearance of a right central venous catheter. Electronically Signed   By: Audie Pinto M.D.   On: 11/14/2018 09:48   Vas [place] Upper Extremity Venous Duplex  Result Date: 11/15/2018 UPPER VENOUS STUDY  Indications: Pain, and Swelling Risk Factors: None identified. Limitations: Poor ultrasound/tissue interface. Comparison Study: No prior studies. Performing Technologist: Oliver Hum RVT  Examination Guidelines: A complete evaluation includes B-mode imaging, spectral Doppler, color Doppler, and power Doppler as needed of all accessible portions of each vessel. Bilateral testing is considered an integral part of a complete examination. Limited examinations for reoccurring indications may be performed as noted.  Right Findings: +----------+------------+---------+-----------+----------+-------+  RIGHT      Compressible Phasicity Spontaneous Properties Summary  +----------+------------+---------+-----------+----------+-------+  Subclavian     Full        Yes        Yes                         +----------+------------+---------+-----------+----------+-------+  Left  Findings: +----------+------------+---------+-----------+----------+-----------------+  LEFT       Compressible Phasicity Spontaneous Properties      Summary       +----------+------------+---------+-----------+----------+-----------------+  IJV            Full        Yes        Yes                                   +----------+------------+---------+-----------+----------+-----------------+  Subclavian     Full        Yes        Yes                                    +----------+------------+---------+-----------+----------+-----------------+  Axillary       Full        Yes        Yes                                   +----------+------------+---------+-----------+----------+-----------------+  Brachial       Full        Yes        Yes                                   +----------+------------+---------+-----------+----------+-----------------+  Radial         Full                                                         +----------+------------+---------+-----------+----------+-----------------+  Ulnar          Full                                                         +----------+------------+---------+-----------+----------+-----------------+  Cephalic       None                                      Age Indeterminate  +----------+------------+---------+-----------+----------+-----------------+  Basilic        Full                                                         +----------+------------+---------+-----------+----------+-----------------+  Summary:  Right: No evidence of thrombosis in the subclavian.  Left: No evidence of deep vein thrombosis in the upper extremity. Findings consistent with age indeterminate superficial vein thrombosis involving the left cephalic vein.  *See table(s) above for measurements and observations.  Diagnosing physician: Deitra Mayo MD Electronically signed by Deitra Mayo MD on 11/15/2018 at 8:56:14 AM.    Final         Scheduled Meds:  Chlorhexidine Gluconate Cloth  6 each Topical Daily   heparin  5,000 Units Subcutaneous Q8H   LORazepam  1 mg Intravenous Once   metoCLOPramide (REGLAN) injection  10 mg Intravenous Q6H   sodium chloride flush  10-40 mL Intracatheter Q12H   Continuous Infusions:  sodium chloride Stopped (11/14/18 0231)   dextrose 5 % and 0.45% NaCl 50 mL/hr at 11/15/18 1200   fluconazole (DIFLUCAN) IV Stopped (11/14/18 2115)   insulin pump     insulin 2 mL/hr at 11/15/18 1200      LOS: 2 days    Time spent: 35 min    Nicolette Bang, MD [facility]  If 7PM-7AM, please contact night-coverage  11/15/2018, 1:07 PM

## 2018-11-15 NOTE — Progress Notes (Signed)
Inpatient Diabetes Program Recommendations  AACE/ADA: New Consensus Statement on Inpatient Glycemic Control (2015)  Target Ranges:  Prepandial:   less than 140 mg/dL      Peak postprandial:   less than 180 mg/dL (1-2 hours)      Critically ill patients:  140 - 180 mg/dL   Lab Results  Component Value Date   GLUCAP 177 (H) 11/15/2018   HGBA1C 8.7 (H) 10/07/2018    Review of Glycemic Control  Diabetes history: DM2 Outpatient Diabetes medications: Insulin pump Current orders for Inpatient glycemic control: IV insulin   AG closed CO2 - 16 Not ready for transition to insulin pump  Pump Settings: Basal rate - 1.4 units/Hour for total of 33.6 units/day Bolus - CHO ratio 1:10 Insulin sensitivity 66  Inpatient Diabetes Program Recommendations:     Continue with drip until criteria met for discontinuation. Restart insulin pump only if pt has new insertion kit. Begin drip 2 hours prior to d/cing IV insulin. If pt does not have supplies, begin:  Lantus 20 units Q24H Novolog 0-9 units tidwc and 0-5 units QHS Novolog 4 units tidwc for meal coverage insulin  Spoke with pt on 10/26 about what happened with going into DKA. Pt states her insertion needle was bent when she removed it. Reviewed importance of checking blood sugars frequently if increasing on insulin pump and be ready to change site and/or give SQ insulin to avoid DKA. Pt instructed to call MD if this happens again.  Continue to follow.  Thank you. Lorenda Peck, RD, LDN, CDE Inpatient Diabetes Coordinator [phone]

## 2018-11-16 DIAGNOSIS — R112 Nausea with vomiting, unspecified: Secondary | ICD-10-CM

## 2018-11-16 LAB — BASIC METABOLIC PANEL
Anion gap: 5 (ref 5–15)
Anion gap: 7 (ref 5–15)
BUN: <5 mg/dL — ABNORMAL LOW (ref 6–20)
BUN: 5 mg/dL — ABNORMAL LOW (ref 6–20)
CO2: 22 mmol/L (ref 22–32)
CO2: 21 mmol/L — ABNORMAL LOW (ref 22–32)
Calcium: 7.9 mg/dL — ABNORMAL LOW (ref 8.9–10.3)
Calcium: 8.2 mg/dL — ABNORMAL LOW (ref 8.9–10.3)
Chloride: 107 mmol/L (ref 98–111)
Chloride: 106 mmol/L (ref 98–111)
Creatinine, Ser: 0.55 mg/dL (ref 0.44–1.00)
Creatinine, Ser: 0.59 mg/dL (ref 0.44–1.00)
GFR calc Af Amer: >60 mL/min (ref >60)
GFR calc Af Amer: >60 mL/min (ref >60)
GFR calc non Af Amer: >60 mL/min (ref >60)
GFR calc non Af Amer: >60 mL/min (ref >60)
Glucose, Bld: 158 mg/dL — ABNORMAL HIGH (ref 70–99)
Glucose, Bld: 134 mg/dL — ABNORMAL HIGH (ref 70–99)
Potassium: 3 mmol/L — ABNORMAL LOW (ref 3.5–5.1)
Potassium: 3.8 mmol/L (ref 3.5–5.1)
Sodium: 134 mmol/L — ABNORMAL LOW (ref 135–145)
Sodium: 134 mmol/L — ABNORMAL LOW (ref 135–145)

## 2018-11-16 LAB — GLUCOSE, CAPILLARY
Glucose-Capillary: 104 mg/dL — ABNORMAL HIGH (ref 70–99)
Glucose-Capillary: 111 mg/dL — ABNORMAL HIGH (ref 70–99)
Glucose-Capillary: 133 mg/dL — ABNORMAL HIGH (ref 70–99)
Glucose-Capillary: 135 mg/dL — ABNORMAL HIGH (ref 70–99)
Glucose-Capillary: 147 mg/dL — ABNORMAL HIGH (ref 70–99)
Glucose-Capillary: 156 mg/dL — ABNORMAL HIGH (ref 70–99)
Glucose-Capillary: 158 mg/dL — ABNORMAL HIGH (ref 70–99)
Glucose-Capillary: 183 mg/dL — ABNORMAL HIGH (ref 70–99)
Glucose-Capillary: 188 mg/dL — ABNORMAL HIGH (ref 70–99)
Glucose-Capillary: 209 mg/dL — ABNORMAL HIGH (ref 70–99)
Glucose-Capillary: 235 mg/dL — ABNORMAL HIGH (ref 70–99)
Glucose-Capillary: 88 mg/dL (ref 70–99)

## 2018-11-16 MED ORDER — INSULIN ASPART 100 UNIT/ML ~~LOC~~ SOLN
4.0000 [IU] | Freq: Three times a day (TID) | SUBCUTANEOUS | Status: DC
  Administered 2018-11-16 – 2018-11-18 (×6): 4 [IU] via SUBCUTANEOUS

## 2018-11-16 MED ORDER — POTASSIUM CHLORIDE CRYS ER 20 MEQ PO TBCR
40.0000 meq | EXTENDED_RELEASE_TABLET | Freq: Once | ORAL | Status: DC
  Filled 2018-11-16: qty 2

## 2018-11-16 MED ORDER — POTASSIUM CHLORIDE 10 MEQ/100ML IV SOLN
10.0000 meq | INTRAVENOUS | Status: AC
  Administered 2018-11-16 (×4): 10 meq via INTRAVENOUS
  Filled 2018-11-16 (×4): qty 100

## 2018-11-16 MED ORDER — INSULIN ASPART 100 UNIT/ML ~~LOC~~ SOLN
0.0000 [IU] | Freq: Three times a day (TID) | SUBCUTANEOUS | Status: DC
  Administered 2018-11-16 (×2): 3 [IU] via SUBCUTANEOUS
  Administered 2018-11-17 (×2): 8 [IU] via SUBCUTANEOUS
  Administered 2018-11-18: 2 [IU] via SUBCUTANEOUS
  Administered 2018-11-18: 11 [IU] via SUBCUTANEOUS

## 2018-11-16 MED ORDER — INSULIN GLARGINE 100 UNIT/ML ~~LOC~~ SOLN
21.0000 [IU] | Freq: Every day | SUBCUTANEOUS | Status: DC
  Administered 2018-11-16 (×2): 21 [IU] via SUBCUTANEOUS
  Filled 2018-11-16 (×3): qty 0.21

## 2018-11-16 NOTE — Progress Notes (Signed)
PROGRESS NOTE    [name]  [id number] DOB: 11-03-1990 DOA: 11/13/2018 PCP: Vicenta Aly, FNP   Brief Narrative:  Per admitting HPI: Stefanie Moreheadis a 28 y.o.femalewith medical history significant forIDDM on insulin pump recently discharged 10/23after treatment for DKA, intractable nausea and vomiting with diabetic gastroparesis and electrolyte abnormalities. Patient represents to the ER today with recurrent nausea and vomiting. Patient was having significant emesis witnessed in the emergency room. Patient reports he felt stable when she was discharged home 2 days ago but recurred again today. She reports abdominal pain. With vomiting that started began progressively worse today. Nothing is made it better and patient continues to have vomiting.  Later in the evening patient's labs came back consistent with DKA. Cross cover with the patient in stepdown with central line placement and insulin drip   Assessment & Plan:   Active Problems:   Intractable nausea and vomiting   Diabetes mellitus (HCC)   HTN (hypertension)   Marijuana abuse   Gastroparesis: Patient with a long history of gastroparesis with intractable nausea and vomiting.She was treated with antiemetics, aggressively fluid resuscitated, patient was improving, unfortunately patient with recurrence of nausea and vomiting yesterday.  It appears to be improving again no vomiting this morning although does report persistent nausea with no p.o. intake  DKA with insulin-dependent diabetesinitially patient was admitted with previous labs not showing any evidence of DKA. Follow-up labs in the evening did show DKA with a gap andAcidosis. Patient was placed on insulin drip transferred down to the stepdown unit.    Patient's acidosis has improved, gap is closed, blood glucose controlled, she been taken off her insulin drip, transition to Lantus, sliding scale insulin and mealtime  NovoLog..  Hypertension we willcontinue tohold patient's antihypertensive. Currently she is on low-dose lisinopril 2.5 mg, given patient's recurrent nausea and vomiting will continue to hold for now, last SBP 125/90 with prior 110/68  Marijuana use. Hyperemesis, differential for marijuana use. Patient denies any recent use, UDS negative for marijuana  Superficial thrombophlebitis: Elevate arm, warm compresses, neurovascularly intact  DVT prophylaxis: Heparin SQ  Code Status: Code    Code Status Orders  (From admission, onward)         Start     Ordered   11/13/18 1913  Full code  Continuous     11/13/18 1912        Code Status History    Date Active Date Inactive Code Status Order ID Comments User Context   11/07/2018 1647 11/11/2018 1922 Full Code [id number]  Stefanie Molder, MD Inpatient   11/04/2018 2355 11/07/2018 1647 Full Code [id number]  Stefanie Patience, MD Inpatient   10/06/2018 1127 10/16/2018 1441 Full Code [id number]  Stefanie Finner, DO ED   10/04/2018 2002 10/05/2018 1405 Full Code [id number]  Stefanie Desanctis, DO ED   12/22/2017 1757 12/27/2017 2015 Full Code [id number]  Stefanie Millers, MD Inpatient   12/19/2017 1737 12/21/2017 1403 Full Code [id number]  Stefanie Deer, MD Inpatient   12/08/2017 1340 12/11/2017 1618 Full Code [id number]  Stefanie Lack, MD ED   11/29/2017 0635 12/07/2017 1840 Full Code [id number]  Stefanie Patience, MD Inpatient   11/15/2017 0503 11/21/2017 1747 Full Code [id number]  Stefanie Patience, MD Inpatient   11/02/2017 1757 11/07/2017 1354 Full Code [id number]  Stefanie Cousins, MD Inpatient   01/21/2017 2304 01/29/2017 1728 Full Code [id number]  Stefanie Patience, MD ED   12/18/2016 0704 12/20/2016 1727 Full Code [id number]  Rondel Jumbo,  PA-C ED   11/05/2016 1525 11/11/2016 1456 Full Code [id number]  Phillips Grout, MD Inpatient   10/23/2016 1342 10/29/2016 2103 Full Code [id number]  Stefanie Mocha, MD ED   09/26/2016 1503  09/29/2016 1533 Full Code [id number]  Stefanie Shell, MD ED   07/15/2016 1712 07/18/2016 2035 Full Code [id number]  Stefanie Lack, MD Inpatient   07/05/2016 1658 07/11/2016 1620 Full Code [id number]  Stefanie Haff, MD Inpatient   06/23/2016 0856 06/28/2016 1346 Full Code [id number]  Stefanie Canterbury, MD Inpatient   06/12/2016 2137 06/14/2016 1723 Full Code [id number]  Stefanie Falcon, MD Inpatient   06/03/2016 2044 06/06/2016 2041 Full Code [id number]  Stefanie Barefoot, NP Inpatient   05/31/2016 2348 06/03/2016 2044 Full Code [id number]  Stefanie Patience, MD Inpatient   05/14/2016 0212 05/16/2016 1718 Full Code [id number]  Stefanie Baker, MD Inpatient   05/04/2016 2140 05/10/2016 2004 Full Code [id number]  Stefanie Eva, MD Inpatient   02/29/2016 2201 03/04/2016 1646 Full Code [id number]  Stefanie Baker, MD Inpatient   02/20/2016 1441 02/25/2016 1723 Full Code [id number]  Elgergawy, Silver Huguenin, MD Inpatient   01/22/2016 1021 01/25/2016 1607 Full Code [id number]  Stefanie Eva, MD Inpatient   01/17/2016 0634 01/19/2016 1944 Full Code [id number]  Stefanie Quill, DO ED   11/18/2015 2238 11/23/2015 1231 Full Code [id number]  Stefanie Haff, MD Inpatient   11/09/2015 2048 11/12/2015 1535 Full Code [id number]  Stefanie Baker, MD Inpatient   11/09/2015 2048 11/09/2015 2048 Full Code [id number]  Stefanie Baker, MD Inpatient   11/03/2015 0238 11/06/2015 1301 Full Code [id number]  Jani Gravel, MD Inpatient   11/02/2015 0128 11/03/2015 0238 Full Code [id number]  Phillips Grout, MD Inpatient   07/10/2015 1645 07/13/2015 1431 Full Code [id number]  Ivor Costa, MD ED   07/01/2015 2203 07/06/2015 1522 Full Code [id number]  Gennaro Africa, MD ED   06/22/2015 0624 06/24/2015 1531 Full Code [id number]  Stefanie Quill, DO Inpatient   06/10/2015 0156 06/11/2015 1519 Full Code [id number]  Stefanie Milan, MD Inpatient   05/28/2015 1609 06/03/2015 1932 Full Code [id number]  Barton Dubois, MD Inpatient   05/24/2015 2359 05/25/2015 1858 Full  Code [id number]  Stefanie Millard, MD Inpatient   04/21/2015 0931 04/23/2015 1445 Full Code [id number]  Gennaro Africa, MD ED   04/08/2015 2229 04/15/2015 1605 Full Code [id number]  Stefanie Milan, MD ED   03/22/2015 0243 03/25/2015 1811 Full Code [id number]  Stefanie Patience, MD ED   02/11/2015 1530 02/12/2015 1805 Full Code [id number]  Stefanie Pickerel, MD Inpatient   11/24/2014 1309 11/29/2014 1929 Full Code [id number]  Rama, Venetia Maxon, MD Inpatient   09/16/2014 0530 09/21/2014 2007 Full Code [id number]  Stefanie Quill, DO ED   06/29/2012 1400 07/01/2012 1517 Full Code [id number]  Velvet Bathe, MD Inpatient   12/08/2011 1543 12/12/2011 1450 Full Code [id number]  Chyrel Masson, RN Inpatient   Advance Care Planning Activity     Family Communication: Called mother LM Disposition Plan:   Patient remained inpatient, requiring IV antiemetics for control of nausea and vomiting, IV pain meds.  Patient not yet medically ready for discharge Consults called: None Admission status: Inpatient   Consultants:   None  Procedures:  US Abdomen Complete  Result Date: 11/04/2018 CLINICAL DATA:  Nausea and vomiting. EXAM: ABDOMEN ULTRASOUND COMPLETE COMPARISON:  CT 10/04/2018 FINDINGS: Gallbladder: Surgically absent. Common bile duct: Diameter: 3 mm. Liver: Subtle liver lesions on prior CT are not defined sonographically. Within normal limits  in parenchymal echogenicity. Portal vein is patent on color Doppler imaging with normal direction of blood flow towards the liver. IVC: No abnormality visualized. Pancreas: Visualized portion unremarkable. Spleen: Size and appearance within normal limits. Right Kidney: Length: 10.5 cm. Hydronephrosis on prior exam has resolved. Echogenicity within normal limits. Left Kidney: Length: 10.5 cm. Echogenicity within normal limits. No mass or hydronephrosis visualized. Abdominal aorta: No aneurysm visualized. Other findings: None. IMPRESSION: 1. Post cholecystectomy without biliary  dilatation. 2. Resolved right hydronephrosis from CT last month. 3. Liver lesions on prior CT are not defined sonographically. Electronically Signed   By: Keith Rake M.D.   On: 11/04/2018 21:33   US Renal  Result Date: 11/07/2018 CLINICAL DATA:  Urinary retention, RIGHT-side pain, history type I diabetes mellitus, hypertension EXAM: RENAL / URINARY TRACT ULTRASOUND COMPLETE COMPARISON:  Renal ultrasound 10/06/2018 FINDINGS: Right Kidney: Renal measurements: 11.7 x 5.1 x 5.0 cm = volume: 156 mL. Normal cortical thickness. Increased cortical echogenicity. Mild RIGHT hydronephrosis. Mild thickening of the wall of the renal pelvis, 3 mm thick. No renal mass or shadowing calcification. Left Kidney: Renal measurements: 10.0 x 5.3 x 4.4 cm = volume: 125 mL. Normal cortical thickness. Increased cortical echogenicity. No mass, hydronephrosis, or shadowing calcification. Bladder: Bladder wall appears diffusely thickened and irregular. Scattered debris within bladder. No focal bladder mass. Other: N/A IMPRESSION: Diffuse bladder wall thickening and irregularity, associated with significant debris within bladder, question cystitis; recommend correlation with urinalysis. Mild RIGHT hydronephrosis with thickening of the wall of the RIGHT renal pelvis, can be seen with pyelonephritis/chronic infection. Medical renal disease changes of both kidneys, suspect related to history of type I diabetes mellitus. Electronically Signed   By: Lavonia Dana M.D.   On: 11/07/2018 18:09   Dg Chest Portable 2 Views  Result Date: 11/14/2018 CLINICAL DATA:  Status post central line placement EXAM: PORTABLE CHEST 1 VIEW COMPARISON:  07/15/2016 FINDINGS: Cardiac shadows within normal limits. Right jugular central line is noted with catheter tip in the mid superior vena cava. No pneumothorax is seen. No focal infiltrate is noted. IMPRESSION: Noted thorax following central line placement is Electronically Signed   By: Inez Catalina M.D.    On: 11/14/2018 00:18   Dg Chest Port 1 View  Result Date: 11/14/2018 CLINICAL DATA:  Order for encounter for central line. EXAM: PORTABLE CHEST 1 VIEW COMPARISON:  Chest radiograph 11/14/2018 FINDINGS: Stable right central venous catheter with tip projecting over the mid SVC. Normal cardiomediastinal contours. The lungs are clear. No pneumothorax or large pleural effusion. No acute finding in the visualized skeleton. IMPRESSION: Stable appearance of a right central venous catheter. Electronically Signed   By: Audie Pinto M.D.   On: 11/14/2018 09:48   Vas [place] Upper Extremity Venous Duplex  Result Date: 11/15/2018 UPPER VENOUS STUDY  Indications: Pain, and Swelling Risk Factors: None identified. Limitations: Poor ultrasound/tissue interface. Comparison Study: No prior studies. Performing Technologist: Oliver Hum RVT  Examination Guidelines: A complete evaluation includes B-mode imaging, spectral Doppler, color Doppler, and power Doppler as needed of all accessible portions of each vessel. Bilateral testing is considered an integral part of a complete examination. Limited examinations for reoccurring indications may be performed as noted.  Right Findings: +----------+------------+---------+-----------+----------+-------+  RIGHT      Compressible Phasicity Spontaneous Properties Summary  +----------+------------+---------+-----------+----------+-------+  Subclavian     Full        Yes        Yes                         +----------+------------+---------+-----------+----------+-------+  Left Findings: +----------+------------+---------+-----------+----------+-----------------+  LEFT       Compressible Phasicity Spontaneous Properties      Summary       +----------+------------+---------+-----------+----------+-----------------+  IJV            Full        Yes        Yes                                   +----------+------------+---------+-----------+----------+-----------------+  Subclavian     Full         Yes        Yes                                   +----------+------------+---------+-----------+----------+-----------------+  Axillary       Full        Yes        Yes                                   +----------+------------+---------+-----------+----------+-----------------+  Brachial       Full        Yes        Yes                                   +----------+------------+---------+-----------+----------+-----------------+  Radial         Full                                                         +----------+------------+---------+-----------+----------+-----------------+  Ulnar          Full                                                         +----------+------------+---------+-----------+----------+-----------------+  Cephalic       None                                      Age Indeterminate  +----------+------------+---------+-----------+----------+-----------------+  Basilic        Full                                                         +----------+------------+---------+-----------+----------+-----------------+  Summary:  Right: No evidence of thrombosis in the subclavian.  Left: No evidence of deep vein thrombosis in the upper extremity. Findings consistent with age indeterminate superficial vein thrombosis involving the left cephalic vein.  *See table(s) above for measurements and observations.  Diagnosing physician: Deitra Mayo MD Electronically signed by Deitra Mayo MD on 11/15/2018 at 8:56:14 AM.    Final    [place] Ekg Site Rite  Result  Date: 11/07/2018 If Site Rite image not attached, placement could not be confirmed due to current cardiac rhythm.    Antimicrobials:   None   Subjective: Vomiting has improved, patient still nauseous Has been weaned off insulin drip  Objective: Vitals:   11/16/18 0301 11/16/18 0400 11/16/18 0800 11/16/18 1200  BP:  (!) 133/94 110/68 125/90  Pulse:  (!) 101 96 (!) 115  Resp:  16 15 13   Temp: 98.6 F (37 C)  98.7 F  (37.1 C) 99.8 F (37.7 C)  TempSrc: Oral  Oral Oral  SpO2:  100% 99% 100%  Weight:      Height:        Intake/Output Summary (Last 24 hours) at 11/16/2018 1318 Last data filed at 11/16/2018 0735 Gross per 24 hour  Intake 1327.16 ml  Output 850 ml  Net 477.16 ml   Filed Weights   11/13/18 0943 11/13/18 2300  Weight: 59 kg 45.3 kg    Examination:  General exam: Appears calm  Respiratory system: Clear to auscultation. Respiratory effort normal. Cardiovascular system: S1 & S2 heard, RRR. No JVD, murmurs, rubs, gallops or clicks. No pedal edema. Gastrointestinal system: Abdomen is nondistended, soft and nontender. No organomegaly or masses felt. Normal bowel sounds heard. Central nervous system: Alert and oriented. No focal neurological deficits. Extremities: Neurovascularly intact, left upper extremity mild asymmetrical swelling consistent with known thrombophlebitis, warm well perfused Skin: No rashes, lesions or ulcers Psychiatry: Judgement and insight appear normal. Mood & affect appropriate.     Data Reviewed: I have personally reviewed following labs and imaging studies  CBC: Recent Labs  Lab 11/12/18 2341 11/13/18 1925 11/14/18 0443  WBC 10.4 18.1* 17.0*  NEUTROABS  --  15.2*  --   HGB 12.8 13.1 9.9*  HCT 39.7 40.2 32.0*  MCV 88.4 87.0 92.2  PLT 376 385 [id number]   Basic Metabolic Panel: Recent Labs  Lab 11/09/18 1755 11/09/18 2311 11/10/18 0245 11/10/18 0653  11/13/18 1925  11/15/18 1338 11/15/18 1800 11/15/18 2132 11/16/18 0209 11/16/18 0500  NA 136 140 136 136   < > 134*   < > 134* 134* 134* 134* 134*  K 3.5 2.5* 3.9 3.4*   < > 5.3*   < > 3.9 3.6 3.4* 3.0* 3.8  CL 108 116* 107 104   < > 103   < > 104 104 105 107 106  CO2 22 19* 23 24   < > 12*   < > 18* 20* 21* 22 21*  GLUCOSE 123* 119* 163* 164*   < > 403*   < > 187* 165* 148* 158* 134*  BUN <5* <5* <5* <5*   < > 9   < > <5* <5* <5* <5* 5*  CREATININE 0.44 0.35* 0.57 0.48   < > 0.91   < > 0.56 0.52  0.51 0.55 0.59  CALCIUM 8.3* 6.5* 8.3* 8.5*   < > 8.8*   < > 8.4* 8.7* 8.1* 7.9* 8.2*  MG 1.5* 1.2* 1.7 2.3  --  1.5*  --   --   --   --   --   --    < > = values in this interval not displayed.   GFR: Estimated Creatinine Clearance: 74.2 mL/min (by C-G formula based on SCr of 0.59 mg/dL). Liver Function Tests: Recent Labs  Lab 11/12/18 2341 11/13/18 1925  AST 17 21  ALT 10 12  ALKPHOS 107 93  BILITOT 0.5 1.0  PROT 8.5* 6.7  ALBUMIN 4.1  3.4*   Recent Labs  Lab 11/12/18 2341 11/13/18 1925  LIPASE 19 13   No results for input(s): AMMONIA in the last 168 hours. Coagulation Profile: No results for input(s): INR, PROTIME in the last 168 hours. Cardiac Enzymes: No results for input(s): CKTOTAL, CKMB, CKMBINDEX, TROPONINI in the last 168 hours. BNP (last 3 results) No results for input(s): PROBNP in the last 8760 hours. HbA1C: No results for input(s): HGBA1C in the last 72 hours. CBG: Recent Labs  Lab 11/16/18 0259 11/16/18 0358 11/16/18 0457 11/16/18 0832 11/16/18 1201  GLUCAP 135* 111* 133* 156* 104*   Lipid Profile: No results for input(s): CHOL, HDL, LDLCALC, TRIG, CHOLHDL, LDLDIRECT in the last 72 hours. Thyroid Function Tests: No results for input(s): TSH, T4TOTAL, FREET4, T3FREE, THYROIDAB in the last 72 hours. Anemia Panel: No results for input(s): VITAMINB12, FOLATE, FERRITIN, TIBC, IRON, RETICCTPCT in the last 72 hours. Sepsis Labs: No results for input(s): PROCALCITON, LATICACIDVEN in the last 168 hours.  Recent Results (from the past 240 hour(s))  Culture, Urine     Status: Abnormal   Collection Time: 11/07/18  8:28 AM   Specimen: Urine, Random  Result Value Ref Range Status   Specimen Description   Final    URINE, RANDOM Performed at [facility] [address] [place], [place] [address]    Special Requests   Final    NONE Performed at [facility], [place] [address], [place], [place] [address]    Culture  40,000 COLONIES/mL YEAST (A)  Final   Report Status 11/08/2018 FINAL  Final  MRSA PCR Screening     Status: None   Collection Time: 11/07/18  7:56 PM   Specimen: Nasal Mucosa; Nasopharyngeal  Result Value Ref Range Status   MRSA by PCR NEGATIVE NEGATIVE Final    Comment:        The GeneXpert MRSA Assay (FDA approved for NASAL specimens only), is one component of a comprehensive MRSA colonization surveillance program. It is not intended to diagnose MRSA infection nor to guide or monitor treatment for MRSA infections. Performed at [facility], [place] [address] [place], [place] [address]   Culture, blood (routine x 2)     Status: None   Collection Time: 11/08/18  2:25 PM   Specimen: BLOOD  Result Value Ref Range Status   Specimen Description   Final    BLOOD LEFT ARM Performed at [facility] [address], [place], [place] [address]    Special Requests   Final    BOTTLES DRAWN AEROBIC ONLY Blood Culture results may not be optimal due to an inadequate volume of blood received in culture bottles Performed at [facility] [address] [place], [place] [address]    Culture   Final    NO GROWTH 5 DAYS Performed at [facility], [place] [address], [place], [place] [address]    Report Status 11/13/2018 FINAL  Final  Culture, blood (routine x 2)     Status: None   Collection Time: 11/08/18  2:25 PM   Specimen: BLOOD  Result Value Ref Range Status   Specimen Description   Final    BLOOD LEFT ARM Performed at [facility] [address] [place], [place] [address]    Special Requests   Final    BOTTLES DRAWN AEROBIC ONLY Blood Culture results may not be optimal due to an inadequate volume of blood received in culture bottles  Performed at [facility], [place] [address], [place], [place] [address]    Culture   Final    NO GROWTH 5 DAYS Performed at [facility], [place] [address] [place], [place] [address]    Report Status 11/13/2018 FINAL  Final  SARS CORONAVIRUS 2 (TAT 6-24 HRS) Nasopharyngeal Nasopharyngeal Swab     Status: None   Collection Time: 11/13/18  4:59 PM   Specimen: Nasopharyngeal Swab  Result Value Ref Range Status   SARS Coronavirus 2 NEGATIVE NEGATIVE Final    Comment: (NOTE) SARS-CoV-2 target nucleic acids are NOT DETECTED. The SARS-CoV-2 RNA is generally detectable in upper and lower respiratory specimens during the acute phase of infection. Negative results do not preclude SARS-CoV-2 infection, do not rule out co-infections with other pathogens, and should not be used as the sole basis for treatment or other patient management decisions. Negative results must be combined with clinical observations, patient history, and epidemiological information. The expected result is Negative. Fact Sheet for Patients: SugarRoll.be Fact Sheet for Healthcare Providers: https://www.woods-mathews.com/ This test is not yet approved or cleared by the [place] FDA and  has been authorized for detection and/or diagnosis of SARS-CoV-2 by FDA under an Emergency Use Authorization (EUA). This EUA will remain  in effect (meaning this test can be used) for the duration of the COVID-19 declaration under Section 56 4(b)(1) of the Act, 21 U.S.C. section 360bbb-3(b)(1), unless the authorization is terminated or revoked sooner. Performed at [facility], [place] [address] [place], [place] [address]          Radiology Studies: Vas [place] Upper Extremity Venous Duplex  Result Date: 11/15/2018 UPPER VENOUS STUDY  Indications: Pain, and Swelling Risk Factors: None identified. Limitations: Poor ultrasound/tissue interface. Comparison Study: No prior studies. Performing Technologist: Oliver Hum RVT  Examination Guidelines: A complete evaluation includes B-mode imaging, spectral Doppler, color Doppler, and power  Doppler as needed of all accessible portions of each vessel. Bilateral testing is considered an integral part of a complete examination. Limited examinations for reoccurring indications may be performed as noted.  Right Findings: +----------+------------+---------+-----------+----------+-------+  RIGHT      Compressible Phasicity Spontaneous Properties Summary  +----------+------------+---------+-----------+----------+-------+  Subclavian     Full        Yes        Yes                         +----------+------------+---------+-----------+----------+-------+  Left Findings: +----------+------------+---------+-----------+----------+-----------------+  LEFT       Compressible Phasicity Spontaneous Properties      Summary       +----------+------------+---------+-----------+----------+-----------------+  IJV            Full        Yes        Yes                                   +----------+------------+---------+-----------+----------+-----------------+  Subclavian     Full        Yes        Yes                                   +----------+------------+---------+-----------+----------+-----------------+  Axillary       Full        Yes  Yes                                   +----------+------------+---------+-----------+----------+-----------------+  Brachial       Full        Yes        Yes                                   +----------+------------+---------+-----------+----------+-----------------+  Radial         Full                                                         +----------+------------+---------+-----------+----------+-----------------+  Ulnar          Full                                                         +----------+------------+---------+-----------+----------+-----------------+  Cephalic       None                                      Age Indeterminate  +----------+------------+---------+-----------+----------+-----------------+  Basilic        Full                                                          +----------+------------+---------+-----------+----------+-----------------+  Summary:  Right: No evidence of thrombosis in the subclavian.  Left: No evidence of deep vein thrombosis in the upper extremity. Findings consistent with age indeterminate superficial vein thrombosis involving the left cephalic vein.  *See table(s) above for measurements and observations.  Diagnosing physician: Deitra Mayo MD Electronically signed by Deitra Mayo MD on 11/15/2018 at 8:56:14 AM.    Final         Scheduled Meds:  Chlorhexidine Gluconate Cloth  6 each Topical Daily   heparin  5,000 Units Subcutaneous Q8H   insulin aspart  0-15 Units Subcutaneous TID WC   insulin aspart  4 Units Subcutaneous TID WC   insulin glargine  21 Units Subcutaneous QHS   LORazepam  1 mg Intravenous Once   metoCLOPramide (REGLAN) injection  10 mg Intravenous Q6H   sodium chloride flush  10-40 mL Intracatheter Q12H   Continuous Infusions:  sodium chloride Stopped (11/14/18 0231)   dextrose 5 % and 0.45% NaCl Stopped (11/16/18 0458)   fluconazole (DIFLUCAN) IV Stopped (11/15/18 2059)   insulin pump     insulin Stopped (11/16/18 0458)     LOS: 3 days    Time spent: [place]    Nicolette Bang, MD [facility]  If 7PM-7AM, please contact night-coverage  11/16/2018, 1:18 PM

## 2018-11-16 NOTE — Progress Notes (Signed)
AG closed, will transition to [place] insulin:  Patient reports normally being on basal lantus at home of 1.4-1.6 u/hr.  Looks like at DC they had her up at 26u lantus last admit earlier this month.  Will do 21u of lantus now (~2/3rds home basal dose) as per DKA phase 2 pathway.  Mod scale SSI and 4u novolog mealtime.  Dc insulin gtt 2h after lantus given as per pathway.

## 2018-11-17 LAB — BASIC METABOLIC PANEL
Anion gap: 5 (ref 5–15)
BUN: 6 mg/dL (ref 6–20)
CO2: 21 mmol/L — ABNORMAL LOW (ref 22–32)
Calcium: 8.4 mg/dL — ABNORMAL LOW (ref 8.9–10.3)
Chloride: 107 mmol/L (ref 98–111)
Creatinine, Ser: 0.72 mg/dL (ref 0.44–1.00)
GFR calc Af Amer: >60 mL/min (ref >60)
GFR calc non Af Amer: >60 mL/min (ref >60)
Glucose, Bld: 48 mg/dL — ABNORMAL LOW (ref 70–99)
Potassium: 3.5 mmol/L (ref 3.5–5.1)
Sodium: 133 mmol/L — ABNORMAL LOW (ref 135–145)

## 2018-11-17 LAB — GLUCOSE, CAPILLARY
Glucose-Capillary: 101 mg/dL — ABNORMAL HIGH (ref 70–99)
Glucose-Capillary: 186 mg/dL — ABNORMAL HIGH (ref 70–99)
Glucose-Capillary: 198 mg/dL — ABNORMAL HIGH (ref 70–99)
Glucose-Capillary: 261 mg/dL — ABNORMAL HIGH (ref 70–99)
Glucose-Capillary: 268 mg/dL — ABNORMAL HIGH (ref 70–99)
Glucose-Capillary: 44 mg/dL — CL (ref 70–99)
Glucose-Capillary: 49 mg/dL — ABNORMAL LOW (ref 70–99)
Glucose-Capillary: 67 mg/dL — ABNORMAL LOW (ref 70–99)
Glucose-Capillary: 99 mg/dL (ref 70–99)

## 2018-11-17 MED ORDER — DEXTROSE 50 % IV SOLN
25.0000 g | INTRAVENOUS | Status: AC
  Administered 2018-11-17: 06:00:00 25 g via INTRAVENOUS
  Filled 2018-11-17: qty 50

## 2018-11-17 MED ORDER — HYDROCODONE-ACETAMINOPHEN 5-325 MG PO TABS
1.0000 | ORAL_TABLET | Freq: Four times a day (QID) | ORAL | Status: DC | PRN
  Administered 2018-11-17 – 2018-11-18 (×3): 1 via ORAL
  Filled 2018-11-17 (×3): qty 1

## 2018-11-17 MED ORDER — INSULIN GLARGINE 100 UNIT/ML ~~LOC~~ SOLN
18.0000 [IU] | Freq: Every day | SUBCUTANEOUS | Status: DC
  Administered 2018-11-17: 18 [IU] via SUBCUTANEOUS
  Filled 2018-11-17 (×2): qty 0.18

## 2018-11-17 NOTE — Progress Notes (Signed)
PROGRESS NOTE    [name]  [id number] DOB: [birth year] DOA: 11/13/2018 PCP: Vicenta Aly, FNP   Brief Narrative:  Per admitting HPI: Stefanie Moreheadis a 28 y.o.femalewith medical history significant forIDDM on insulin pump recently discharged 10/23after treatment for DKA, intractable nausea and vomiting with diabetic gastroparesis and electrolyte abnormalities. Patient represents to the ER today with recurrent nausea and vomiting. Patient was having significant emesis witnessed in the emergency room. Patient reports he felt stable when she was discharged home 2 days ago but recurred again today. She reports abdominal pain. With vomiting that started began progressively worse today. Nothing is made it better and patient continues to have vomiting.  Later in the evening after admission patient's labs came back she is now in DKA, she was transferred down to the stepdown unit placed on DKA protocol, central line was placed.  Patient is very slow to progress with recurrent nausea and vomiting.  Appears more stable this morning 10/29 and transferred out to floor   Assessment & Plan:   Active Problems:   Intractable nausea and vomiting   Diabetes mellitus (HCC)   HTN (hypertension)   Marijuana abuse   Gastroparesis: Patient with a long history of gastroparesis with intractable nausea and vomiting.She was treated with antiemetics, aggressively fluid resuscitated,patient was improving, unfortunately patient with recurrence of nausea and vomiting yesterday morning which kept patient in the ICU.    She appears to be improving again no vomiting this morning although does report intermittent nausea which appears to be improving.Clear liquids today advance as tolerated  DKA with insulin-dependent diabetesinitially patient was admitted with previous labs not showing any evidence of DKA. Follow-up labsin the eveningdid show DKA with a gap andAcidosis. Patient was  placed on insulin drip transferreddown to the stepdown unit.   Patient's acidosis has improved, gap is closed, blood glucose controlled, she been taken off her insulin drip DKA protocol, transitioned to Lantus, sliding scale insulin and mealtime NovoLog..  Hypertension we willcontinue tohold patient's antihypertensive. Prior to admission she is on low-dose lisinopril 2.5 mg, given patient's recurrent nausea and vomiting willcontinue tohold for now, last SBP 103/81  Marijuana use. Hyperemesis, differential for marijuana use. Patient denies any recent use,UDS negative for marijuana  Superficial thrombophlebitis: Elevate arm, warm compresses, neurovascularly intact  DVT prophylaxis: Heparin SQ  Code Status: Full    Code Status Orders  (From admission, onward)         Start     Ordered   11/13/18 1913  Full code  Continuous     11/13/18 1912        Code Status History    Date Active Date Inactive Code Status Order ID Comments User Context   11/07/2018 [id number] 11/11/2018 1922 Full Code [id number]  Louellen Molder, MD Inpatient   11/04/2018 2355 11/07/2018 1647 Full Code [id number]  Rise Patience, MD Inpatient   10/06/2018 1127 10/16/2018 1441 Full Code [id number]  Jonnie Finner, DO ED   10/04/2018 2002 10/05/2018 1405 Full Code [id number]  Orene Desanctis, DO ED   12/22/2017 1757 12/27/2017 2015 Full Code [id number]  Tawni Millers, MD Inpatient   12/19/2017 1737 12/21/2017 1403 Full Code [id number]  Cristal Deer, MD Inpatient   12/08/2017 1340 12/11/2017 1618 Full Code [id number]  Damita Lack, MD ED   11/29/2017 0635 12/07/2017 1840 Full Code [id number]  Rise Patience, MD Inpatient   11/15/2017 0503 11/21/2017 1747 Full Code [id number]  Rise Patience, MD Inpatient   11/02/2017 1757 11/07/2017  [place] Full Code [id number]  Charlynne Cousins, MD Inpatient   01/21/2017 2304 01/29/2017 1728 Full Code [id number]  Rise Patience, MD ED   12/18/2016 0704  12/20/2016 1727 Full Code [id number]  Rondel Jumbo, PA-C ED   11/05/2016 1525 11/11/2016 1456 Full Code [id number]  Phillips Grout, MD Inpatient   10/23/2016 1342 10/29/2016 2103 Full Code [id number]  Elwin Mocha, MD ED   09/26/2016 1503 09/29/2016 1533 Full Code [id number]  Georgette Shell, MD ED   07/15/2016 1712 07/18/2016 2035 Full Code [id number]  Damita Lack, MD Inpatient   07/05/2016 1658 07/11/2016 1620 Full Code [id number]  Bonnielee Haff, MD Inpatient   06/23/2016 0856 06/28/2016 1346 Full Code [id number]  Janece Canterbury, MD Inpatient   06/12/2016 2137 06/14/2016 1723 Full Code [id number]  Sid Falcon, MD Inpatient   06/03/2016 2044 06/06/2016 2041 Full Code [id number]  Gardiner Barefoot, NP Inpatient   05/31/2016 2348 06/03/2016 2044 Full Code [id number]  Rise Patience, MD Inpatient   05/14/2016 0212 05/16/2016 1718 Full Code [id number]  Toy Baker, MD Inpatient   05/04/2016 2140 05/10/2016 2004 Full Code [id number]  Orson Eva, MD Inpatient   02/29/2016 2201 03/04/2016 1646 Full Code [id number]  Toy Baker, MD Inpatient   02/20/2016 1441 02/25/2016 1723 Full Code [id number]  Elgergawy, Silver Huguenin, MD Inpatient   01/22/2016 1021 01/25/2016 1607 Full Code [id number]  Orson Eva, MD Inpatient   01/17/2016 0634 01/19/2016 1944 Full Code [id number]  Etta Quill, DO ED   11/18/2015 2238 11/23/2015 1231 Full Code [id number]  Bonnielee Haff, MD Inpatient   11/09/2015 2048 11/12/2015 1535 Full Code [id number]  Toy Baker, MD Inpatient   11/09/2015 2048 11/09/2015 2048 Full Code [id number]  Toy Baker, MD Inpatient   11/03/2015 0238 11/06/2015 1301 Full Code [id number]  Jani Gravel, MD Inpatient   11/02/2015 0128 11/03/2015 0238 Full Code [id number]  Phillips Grout, MD Inpatient   07/10/2015 1645 07/13/2015 1431 Full Code [id number]  Ivor Costa, MD ED   07/01/2015 2203 07/06/2015 1522 Full Code [id number]  Gennaro Africa, MD ED   06/22/2015 0624 06/24/2015 1531 Full Code  [id number]  Etta Quill, DO Inpatient   06/10/2015 0156 06/11/2015 1519 Full Code [id number]  Reubin Milan, MD Inpatient   05/28/2015 1609 06/03/2015 1932 Full Code [id number]  Barton Dubois, MD Inpatient   05/24/2015 2359 05/25/2015 1858 Full Code [id number]  Theressa Millard, MD Inpatient   04/21/2015 0931 04/23/2015 1445 Full Code [id number]  Gennaro Africa, MD ED   04/08/2015 2229 04/15/2015 1605 Full Code [id number]  Reubin Milan, MD ED   03/22/2015 0243 03/25/2015 1811 Full Code [id number]  Rise Patience, MD ED   02/11/2015 1530 02/12/2015 1805 Full Code [id number]  Greer Pickerel, MD Inpatient   11/24/2014 1309 11/29/2014 1929 Full Code [id number]  Rama, Venetia Maxon, MD Inpatient   09/16/2014 0530 09/21/2014 2007 Full Code [id number]  Etta Quill, DO ED   06/29/2012 1400 07/01/2012 1517 Full Code [id number]  Velvet Bathe, MD Inpatient   12/08/2011 1543 12/12/2011 1450 Full Code [id number]  Chyrel Masson, RN Inpatient   Advance Care Planning Activity     Family Communication: Left message with mother yesterday discussed in full detail with patient today Disposition Plan:   Patient will remain inpatient for continued treatment with IV antiemetics as she has had recurrent nausea.  Slowly advance diet as tolerated.  Patient not stable for medical discharge Consults called: None Admission status: Inpatient  Consultants:   None  Procedures:  US Abdomen Complete  Result Date: 11/04/2018 CLINICAL DATA:  Nausea and vomiting. EXAM: ABDOMEN ULTRASOUND COMPLETE COMPARISON:  CT 10/04/2018 FINDINGS: Gallbladder: Surgically absent. Common bile duct: Diameter: 3 mm. Liver: Subtle liver lesions on prior CT are not defined sonographically. Within normal limits in parenchymal echogenicity. Portal vein is patent on color Doppler imaging with normal direction of blood flow towards the liver. IVC: No abnormality visualized. Pancreas: Visualized portion unremarkable. Spleen: Size and appearance within normal  limits. Right Kidney: Length: 10.5 cm. Hydronephrosis on prior exam has resolved. Echogenicity within normal limits. Left Kidney: Length: 10.5 cm. Echogenicity within normal limits. No mass or hydronephrosis visualized. Abdominal aorta: No aneurysm visualized. Other findings: None. IMPRESSION: 1. Post cholecystectomy without biliary dilatation. 2. Resolved right hydronephrosis from CT last month. 3. Liver lesions on prior CT are not defined sonographically. Electronically Signed   By: Keith Rake M.D.   On: 11/04/2018 21:33   US Renal  Result Date: 11/07/2018 CLINICAL DATA:  Urinary retention, RIGHT-side pain, history type I diabetes mellitus, hypertension EXAM: RENAL / URINARY TRACT ULTRASOUND COMPLETE COMPARISON:  Renal ultrasound 10/06/2018 FINDINGS: Right Kidney: Renal measurements: 11.7 x 5.1 x 5.0 cm = volume: 156 mL. Normal cortical thickness. Increased cortical echogenicity. Mild RIGHT hydronephrosis. Mild thickening of the wall of the renal pelvis, 3 mm thick. No renal mass or shadowing calcification. Left Kidney: Renal measurements: 10.0 x 5.3 x 4.4 cm = volume: 125 mL. Normal cortical thickness. Increased cortical echogenicity. No mass, hydronephrosis, or shadowing calcification. Bladder: Bladder wall appears diffusely thickened and irregular. Scattered debris within bladder. No focal bladder mass. Other: N/A IMPRESSION: Diffuse bladder wall thickening and irregularity, associated with significant debris within bladder, question cystitis; recommend correlation with urinalysis. Mild RIGHT hydronephrosis with thickening of the wall of the RIGHT renal pelvis, can be seen with pyelonephritis/chronic infection. Medical renal disease changes of both kidneys, suspect related to history of type I diabetes mellitus. Electronically Signed   By: Lavonia Dana M.D.   On: 11/07/2018 18:09   Dg Chest Portable 2 Views  Result Date: 11/14/2018 CLINICAL DATA:  Status post central line placement EXAM: PORTABLE  CHEST 1 VIEW COMPARISON:  07/15/2016 FINDINGS: Cardiac shadows within normal limits. Right jugular central line is noted with catheter tip in the mid superior vena cava. No pneumothorax is seen. No focal infiltrate is noted. IMPRESSION: Noted thorax following central line placement is Electronically Signed   By: Inez Catalina M.D.   On: 11/14/2018 00:18   Dg Chest Port 1 View  Result Date: 11/14/2018 CLINICAL DATA:  Order for encounter for central line. EXAM: PORTABLE CHEST 1 VIEW COMPARISON:  Chest radiograph 11/14/2018 FINDINGS: Stable right central venous catheter with tip projecting over the mid SVC. Normal cardiomediastinal contours. The lungs are clear. No pneumothorax or large pleural effusion. No acute finding in the visualized skeleton. IMPRESSION: Stable appearance of a right central venous catheter. Electronically Signed   By: Audie Pinto M.D.   On: 11/14/2018 09:48   Vas [place] Upper Extremity Venous Duplex  Result Date: 11/15/2018 UPPER VENOUS STUDY  Indications: Pain, and Swelling Risk Factors: None identified. Limitations: Poor ultrasound/tissue interface. Comparison Study: No prior studies. Performing Technologist: Oliver Hum RVT  Examination Guidelines: A complete evaluation includes B-mode imaging, spectral Doppler, color Doppler, and power Doppler as needed of all accessible portions of each vessel. Bilateral testing is considered an integral part of a complete examination. Limited examinations for reoccurring indications may be performed as  noted.  Right Findings: +----------+------------+---------+-----------+----------+-------+  RIGHT      Compressible Phasicity Spontaneous Properties Summary  +----------+------------+---------+-----------+----------+-------+  Subclavian     Full        Yes        Yes                         +----------+------------+---------+-----------+----------+-------+  Left Findings:  +----------+------------+---------+-----------+----------+-----------------+  LEFT       Compressible Phasicity Spontaneous Properties      Summary       +----------+------------+---------+-----------+----------+-----------------+  IJV            Full        Yes        Yes                                   +----------+------------+---------+-----------+----------+-----------------+  Subclavian     Full        Yes        Yes                                   +----------+------------+---------+-----------+----------+-----------------+  Axillary       Full        Yes        Yes                                   +----------+------------+---------+-----------+----------+-----------------+  Brachial       Full        Yes        Yes                                   +----------+------------+---------+-----------+----------+-----------------+  Radial         Full                                                         +----------+------------+---------+-----------+----------+-----------------+  Ulnar          Full                                                         +----------+------------+---------+-----------+----------+-----------------+  Cephalic       None                                      Age Indeterminate  +----------+------------+---------+-----------+----------+-----------------+  Basilic        Full                                                         +----------+------------+---------+-----------+----------+-----------------+  Summary:  Right: No evidence  of thrombosis in the subclavian.  Left: No evidence of deep vein thrombosis in the upper extremity. Findings consistent with age indeterminate superficial vein thrombosis involving the left cephalic vein.  *See table(s) above for measurements and observations.  Diagnosing physician: Deitra Mayo MD Electronically signed by Deitra Mayo MD on 11/15/2018 at 8:56:14 AM.    Final    [place] Ekg Site Rite  Result Date: 11/07/2018 If Site Rite  image not attached, placement could not be confirmed due to current cardiac rhythm.    Antimicrobials:   None   Subjective: Patient clinically looks better this morning. Reports vomiting has resolved still with some nausea Wants to try to advance diet  Objective: Vitals:   11/17/18 0800 11/17/18 0954 11/17/18 1151 11/17/18 1305  BP: 107/70   103/81  Pulse: (!) 105 (!) 105  (!) 103  Resp: 15 11  16   Temp:   98.9 F (37.2 C) 98.6 F (37 C)  TempSrc:   Oral Oral  SpO2: 100% 100%  100%  Weight:      Height:        Intake/Output Summary (Last 24 hours) at 11/17/2018 1445 Last data filed at 11/17/2018 0844 Gross per 24 hour  Intake 50 ml  Output 150 ml  Net -100 ml   Filed Weights   11/13/18 0943 11/13/18 2300  Weight: 59 kg 45.3 kg    Examination:  General exam: Appears calm and comfortable  Respiratory system: Clear to auscultation. Respiratory effort normal. Cardiovascular system: S1 & S2 heard, RRR. No JVD, murmurs, rubs, gallops or clicks. No pedal edema. Gastrointestinal system: Abdomen is nondistended, soft although mildly tender nonfocal. No organomegaly or masses felt. Normal bowel sounds heard. Central nervous system: Alert and oriented. No focal neurological deficits. Extremities: Warm well perfused no edema no contractures Skin: No rashes, lesions or ulcers Psychiatry: Judgement and insight appear normal. Mood & affect appropriate.     Data Reviewed: I have personally reviewed following labs and imaging studies  CBC: Recent Labs  Lab 11/12/18 2341 11/13/18 1925 11/14/18 0443  WBC 10.4 18.1* 17.0*  NEUTROABS  --  15.2*  --   HGB 12.8 13.1 9.9*  HCT 39.7 40.2 32.0*  MCV 88.4 87.0 92.2  PLT 376 385 [id number]   Basic Metabolic Panel: Recent Labs  Lab 11/13/18 1925  11/15/18 1800 11/15/18 2132 11/16/18 0209 11/16/18 0500 11/17/18 0506  NA 134*   < > 134* 134* 134* 134* 133*  K 5.3*   < > 3.6 3.4* 3.0* 3.8 3.5  CL 103   < > 104 105 107 106 107   CO2 12*   < > 20* 21* 22 21* 21*  GLUCOSE 403*   < > 165* 148* 158* 134* 48*  BUN 9   < > <5* <5* <5* 5* 6  CREATININE 0.91   < > 0.52 0.51 0.55 0.59 0.72  CALCIUM 8.8*   < > 8.7* 8.1* 7.9* 8.2* 8.4*  MG 1.5*  --   --   --   --   --   --    < > = values in this interval not displayed.   GFR: Estimated Creatinine Clearance: 74.2 mL/min (by C-G formula based on SCr of 0.72 mg/dL). Liver Function Tests: Recent Labs  Lab 11/12/18 2341 11/13/18 1925  AST 17 21  ALT 10 12  ALKPHOS 107 93  BILITOT 0.5 1.0  PROT 8.5* 6.7  ALBUMIN 4.1 3.4*   Recent Labs  Lab 11/12/18 2341 11/13/18 1925  LIPASE 19 13   No results for input(s): AMMONIA in the last 168 hours. Coagulation Profile: No results for input(s): INR, PROTIME in the last 168 hours. Cardiac Enzymes: No results for input(s): CKTOTAL, CKMB, CKMBINDEX, TROPONINI in the last 168 hours. BNP (last 3 results) No results for input(s): PROBNP in the last 8760 hours. HbA1C: No results for input(s): HGBA1C in the last 72 hours. CBG: Recent Labs  Lab 11/17/18 0540 11/17/18 0556 11/17/18 0617 11/17/18 0742 11/17/18 1116  GLUCAP 49* 67* 186* 268* 101*   Lipid Profile: No results for input(s): CHOL, HDL, LDLCALC, TRIG, CHOLHDL, LDLDIRECT in the last 72 hours. Thyroid Function Tests: No results for input(s): TSH, T4TOTAL, FREET4, T3FREE, THYROIDAB in the last 72 hours. Anemia Panel: No results for input(s): VITAMINB12, FOLATE, FERRITIN, TIBC, IRON, RETICCTPCT in the last 72 hours. Sepsis Labs: No results for input(s): PROCALCITON, LATICACIDVEN in the last 168 hours.  Recent Results (from the past 240 hour(s))  MRSA PCR Screening     Status: None   Collection Time: 11/07/18  7:56 PM   Specimen: Nasal Mucosa; Nasopharyngeal  Result Value Ref Range Status   MRSA by PCR NEGATIVE NEGATIVE Final    Comment:        The GeneXpert MRSA Assay (FDA approved for NASAL specimens only), is one component of a comprehensive MRSA  colonization surveillance program. It is not intended to diagnose MRSA infection nor to guide or monitor treatment for MRSA infections. Performed at [facility], [place] [address] [place], [place] [address]   Culture, blood (routine x 2)     Status: None   Collection Time: 11/08/18  2:25 PM   Specimen: BLOOD  Result Value Ref Range Status   Specimen Description   Final    BLOOD LEFT ARM Performed at [place] [address] [place], [place] [address]    Special Requests   Final    BOTTLES DRAWN AEROBIC ONLY Blood Culture results may not be optimal due to an inadequate volume of blood received in culture bottles Performed at [place] [address] [place], [place] [address]    Culture   Final    NO GROWTH 5 DAYS Performed at [facility], [place] [address] [place], [place] [address]    Report Status 11/13/2018 FINAL  Final  Culture, blood (routine x 2)     Status: None   Collection Time: 11/08/18  2:25 PM   Specimen: BLOOD  Result Value Ref Range Status   Specimen Description   Final    BLOOD LEFT ARM Performed at [place] [address] [place], [place] [address]    Special Requests   Final    BOTTLES DRAWN AEROBIC ONLY Blood Culture results may not be optimal due to an inadequate volume of blood received in culture bottles Performed at [place] [address], [place], [place] [address]    Culture   Final    NO GROWTH 5 DAYS Performed at [facility], [place] [address], [place], [place] [address]    Report Status 11/13/2018 FINAL  Final  SARS CORONAVIRUS 2 (TAT 6-24 HRS) Nasopharyngeal Nasopharyngeal Swab     Status: None   Collection Time: 11/13/18  4:59 PM   Specimen: Nasopharyngeal Swab  Result Value Ref Range Status   SARS Coronavirus 2 NEGATIVE NEGATIVE Final    Comment: (NOTE) SARS-CoV-2 target nucleic acids are NOT DETECTED. The  SARS-CoV-2 RNA  is generally detectable in upper and lower respiratory specimens during the acute phase of infection. Negative results do not preclude SARS-CoV-2 infection, do not rule out co-infections with other pathogens, and should not be used as the sole basis for treatment or other patient management decisions. Negative results must be combined with clinical observations, patient history, and epidemiological information. The expected result is Negative. Fact Sheet for Patients: SugarRoll.be Fact Sheet for Healthcare Providers: https://www.woods-mathews.com/ This test is not yet approved or cleared by the [place] FDA and  has been authorized for detection and/or diagnosis of SARS-CoV-2 by FDA under an Emergency Use Authorization (EUA). This EUA will remain  in effect (meaning this test can be used) for the duration of the COVID-19 declaration under Section 56 4(b)(1) of the Act, 21 U.S.C. section 360bbb-3(b)(1), unless the authorization is terminated or revoked sooner. Performed at [facility], [place] [address] [place], [place] [address]          Radiology Studies: No results found.      Scheduled Meds:  Chlorhexidine Gluconate Cloth  6 each Topical Daily   heparin  5,000 Units Subcutaneous Q8H   insulin aspart  0-15 Units Subcutaneous TID WC   insulin aspart  4 Units Subcutaneous TID WC   insulin glargine  21 Units Subcutaneous QHS   LORazepam  1 mg Intravenous Once   metoCLOPramide (REGLAN) injection  10 mg Intravenous Q6H   sodium chloride flush  10-40 mL Intracatheter Q12H   Continuous Infusions:  sodium chloride Stopped (11/14/18 0231)   dextrose 5 % and 0.45% NaCl Stopped (11/16/18 0458)   fluconazole (DIFLUCAN) IV Stopped (11/16/18 2038)   insulin pump     insulin Stopped (11/16/18 0458)     LOS: 4 days    Time spent: 35 min    Nicolette Bang, MD [facility]  If  7PM-7AM, please contact night-coverage  11/17/2018, 2:45 PM

## 2018-11-17 NOTE — TOC Initial Note (Signed)
[facility] - Initial/Assessment Note    Patient Details  Name: [name] MRN: [id number] Date of Birth: 1990/01/30  [facility]) CM/SW Contact:    Trish Mage, LCSW Phone Number: 11/17/2018, 1:55 PM  Clinical Narrative:    [name] was seen due to high risk of re-admission. She was difficult to engaged due to complaints of nausea, gave minimal information. She states she lives with her mother, her brother and her 17 year old daughter. They have internet access for her daughter to attend school virtually.  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 She has transportation, sees Vicenta Aly at [facility] for PCP needs, and states her medications are affordable. [name] expressed no needs, nor did she ask for any help in particular.  TOC will continue to follow during the course of hospitalization.               Expected Discharge Plan: Home/Self Care Barriers to Discharge: Continued Medical Work up   Patient Goals and CMS Choice Patient states their goals for this hospitalization and ongoing recovery are:: to get better      Expected Discharge Plan and Services Expected Discharge Plan: Home/Self Care       Living arrangements for the past 2 months: Single Family Home                                      Prior Living Arrangements/Services Living arrangements for the past 2 months: Single Family Home Lives with:: Self Patient language and need for interpreter reviewed:: Yes Do you feel safe going back to the place where you live?: Yes      Need for Family Participation in Patient Care: No (Comment) Care giver support system in place?: Yes (comment)   Criminal Activity/Legal Involvement Pertinent to Current Situation/Hospitalization: No - Comment as needed  Activities of Daily Living Home Assistive Devices/Equipment: Eyeglasses, CBG Meter, Insulin Pump ADL Screening (condition at time of  admission) Patient's cognitive ability adequate to safely complete daily activities?: Yes Is the patient deaf or have difficulty hearing?: No Does the patient have difficulty seeing, even when wearing glasses/contacts?: No Does the patient have difficulty concentrating, remembering, or making decisions?: No Patient able to express need for assistance with ADLs?: Yes Does the patient have difficulty dressing or bathing?: No Independently performs ADLs?: Yes (appropriate for developmental age) Does the patient have difficulty walking or climbing stairs?: No Weakness of Legs: None Weakness of Arms/Hands: None  Permission Sought/Granted                  Emotional Assessment Appearance:: Appears stated age Attitude/Demeanor/Rapport: Engaged Affect (typically observed): Accepting Orientation: : Oriented to Place, Oriented to  Time, Oriented to Situation, Oriented to Self   Psych Involvement: No (comment)  Admission diagnosis:  Intractable nausea and vomiting [R11.2] Patient Active Problem List   Diagnosis Date Noted  . Diabetes mellitus ([place]) 11/13/2018  . HTN (hypertension) 11/13/2018  . Marijuana abuse 11/13/2018  . Acute pyelonephritis 10/07/2018  . Hydronephrosis of right kidney 10/05/2018  . Diffuse pain   . Elevated blood pressure reading 12/03/2017  . Tachycardia 11/30/2017  . Hypernatremia 11/02/2017  . Nausea and vomiting 01/22/2017  . Nausea & vomiting 01/21/2017  . Intractable cyclical vomiting syndrome 12/18/2016  . Leukocytosis 10/24/2016  . N&V (nausea and vomiting) 10/23/2016  . DKA, type 1 ([place])  09/26/2016  . Thrombocytosis ([place]) 07/08/2016  . Candiduria, asymptomatic 06/23/2016  . Hyponatremia 06/13/2016  . Hematuria 05/13/2016  . UTI (urinary tract infection) 01/22/2016  . Diarrhea 11/09/2015  . Acute urinary retention   . Gastroparesis due to DM ([place]) 07/10/2015  . GERD (gastroesophageal reflux disease) 07/10/2015  . Depression with anxiety  07/10/2015  . Gastroparesis 06/22/2015  . Altered mental status 06/22/2015  . Volume depletion 06/10/2015  . Protein-calorie malnutrition, severe 06/10/2015  . Hyperglycemia   . Elevated bilirubin   . Hematemesis with nausea   . Intractable nausea and vomiting 05/28/2015  . Abdominal pain in female   . Abdominal pain 05/24/2015  . Hypertension 05/24/2015  . Dehydration   . Diabetic gastroparesis ([place])   . Chronic diastolic heart failure ([place]) 04/11/2015  . Hematemesis 04/08/2015  . DKA (diabetic ketoacidoses) ([place]) 03/22/2015  . S/P laparoscopic cholecystectomy 02/11/2015  . Postextubation stridor   . Pancreatitis, acute 11/26/2014  . Volume overload 11/26/2014  . Hypokalemia 11/26/2014  . Hepatic steatosis 11/26/2014  . Liver mass 11/26/2014  . Sepsis ([place]) 11/25/2014  . Sinus tachycardia 11/25/2014  . Hypomagnesemia 11/25/2014  . Hypophosphatemia 11/25/2014  . Elevated LFTs 11/24/2014  . AKI (acute kidney injury) ([place]) 11/24/2014  . Migraine headache 11/24/2014  . Asthma 06/29/2012  . Uncontrolled type 1 diabetes mellitus ([place]) 06/19/2010  . Goiter, unspecified 06/19/2010   PCP:  Vicenta Aly, [place] [facility]:   [id number] [place], [address] [place] [address] Phone: [phone] Fax: [phone]     Social Determinants of Health ([place]) Interventions    Readmission Risk Interventions Readmission Risk Prevention Plan 11/14/2018  Transportation Screening Complete  Medication Review (RN Care Manager) Complete  PCP or Specialist appointment within 3-5 days of discharge Not Complete  PCP/Specialist Appt Not Complete comments not ready for dc  HRI or [place] Complete  SW Recovery Care/Counseling Consult Complete  [place] Not Applicable  Some recent data might be hidden

## 2018-11-17 NOTE — Progress Notes (Signed)
Hypoglycemic Event  CBG: 44  Treatment: 8 oz cran-grape juice  Symptoms: cold, clammy  Follow-up CBG: Time: 0540 CBG Result: 49  Treatment :Will repeat 8 oz of juice   Possible Reasons for Event: Pt has not eaten solid food since they have been off of drip due to nausea.   Comments/MD notified: Yes     Camie Patience

## 2018-11-17 NOTE — Progress Notes (Signed)
Hypoglycemic Event  CBG: 67  Treatment: Dextrose 50 given   Symptoms: Clammy, cold  Follow-up CBG: Time: 0615 CBG Result: 186  Comments/MD notified: Yes    Alias [name]

## 2018-11-17 NOTE — Progress Notes (Signed)
Inpatient Diabetes Program Recommendations  AACE/ADA: New Consensus Statement on Inpatient Glycemic Control (2015)  Target Ranges:  Prepandial:   less than 140 mg/dL      Peak postprandial:   less than 180 mg/dL (1-2 hours)      Critically ill patients:  140 - 180 mg/dL   Lab Results  Component Value Date   GLUCAP 268 (H) 11/17/2018   HGBA1C 8.7 (H) 10/07/2018    Review of Glycemic Control  Blood sugars very labile. Had hypoglycemia this am with CBG 44-67 mg/dL. D50 given.   Inpatient Diabetes Program Recommendations:     Reduce Lantus to 18 units QHS Decrease Novolog to 0-9 units tidwc and 0-5 units QHS Continue with Novolog 4 units tidwc.  Will continue to follow.  Thank you. Lorenda Peck, RD, LDN, CDE Inpatient Diabetes Coordinator [phone]

## 2018-11-18 LAB — GLUCOSE, CAPILLARY
Glucose-Capillary: 321 mg/dL — ABNORMAL HIGH (ref 70–99)
Glucose-Capillary: 143 mg/dL — ABNORMAL HIGH (ref 70–99)
Glucose-Capillary: 220 mg/dL — ABNORMAL HIGH (ref 70–99)

## 2018-11-18 LAB — BASIC METABOLIC PANEL
Anion gap: 6 (ref 5–15)
BUN: 8 mg/dL (ref 6–20)
CO2: 25 mmol/L (ref 22–32)
Calcium: 8.4 mg/dL — ABNORMAL LOW (ref 8.9–10.3)
Chloride: 102 mmol/L (ref 98–111)
Creatinine, Ser: 0.69 mg/dL (ref 0.44–1.00)
GFR calc Af Amer: >60 mL/min (ref >60)
GFR calc non Af Amer: >60 mL/min (ref >60)
Glucose, Bld: 310 mg/dL — ABNORMAL HIGH (ref 70–99)
Potassium: 4.1 mmol/L (ref 3.5–5.1)
Sodium: 133 mmol/L — ABNORMAL LOW (ref 135–145)

## 2018-11-18 MED ORDER — METOCLOPRAMIDE HCL 10 MG PO TABS: 10.0000 mg | ORAL_TABLET | Freq: Three times a day (TID) | ORAL | 1 refills | Status: DC

## 2018-11-18 MED ORDER — ONDANSETRON 4 MG PO TBDP: 4.0000 mg | ORAL_TABLET | Freq: Three times a day (TID) | ORAL | 0 refills | Status: DC | PRN

## 2018-11-18 NOTE — Discharge Summary (Signed)
Physician Discharge Summary  [name] [id number] DOB: Oct 04, 1990 DOA: 11/13/2018  PCP: Vicenta Aly, FNP  Admit date: 11/13/2018 Discharge date: 11/18/2018  Admitted From: Home Disposition: Home  Recommendations for Outpatient Follow-up:  1. Follow up with PCP in 1-2 weeks 2. Please obtain BMP/CBC in one week 3. Patient has been advised to follow-up with her primary endocrinologist carcinoma next 1 week.  Discharge Condition: Stable CODE STATUS: Full code Diet recommendation: Heart healthy carb modified  Brief/Interim Summary: 28 year old female with a history of insulin-dependent diabetes who is on an insulin pump, was admitted to the hospital with nausea, vomiting due to diabetic gastroparesis as well as diabetic ketoacidosis.  She reports that prior to admission, her insulin pump was not functioning correctly.  She reports that catheter that injects the insulin was not placed accurately.  The patient was treated with intravenous insulin in the hospital and subsequently transitioned to subcutaneous insulin.  Overall blood sugars have improved.  Anion gap has resolved.  Her diet was slowly advanced, she is now tolerating a solid diet.  She is anxious to discharge home.  She will be continued on her home dose of Reglan for gastroparesis.  She reports tolerating a cheeseburger at this time.  The patient will follow-up with her primary endocrinologist in the next week.  She says that she has all her supplies and medications for insulin pump.  Discharge Diagnoses:  Active Problems:   Intractable nausea and vomiting   Diabetes mellitus (HCC)   HTN (hypertension)   Marijuana abuse    Discharge Instructions  Discharge Instructions    Diet - low sodium heart healthy   Complete by: As directed    Increase activity slowly   Complete by: As directed      Allergies as of 11/18/2018      Reactions   Peanut-containing Drug Products Swelling, Other (See Comments)    Reaction:  Swelling of mouth and lips    Food Swelling, Other (See Comments)   Pt is allergic to strawberries.   Reaction:  Swelling of mouth and lips    Ultram [tramadol] Itching      Medication List    STOP taking these medications   pantoprazole 40 MG tablet Commonly known as: PROTONIX     TAKE these medications   albuterol 108 (90 Base) MCG/ACT inhaler Commonly known as: VENTOLIN HFA Inhale 1-2 puffs into the lungs every 6 (six) hours as needed for wheezing or shortness of breath.   blood glucose meter kit and supplies Dispense based on patient and insurance preference. Use up to four times daily as directed. (FOR ICD-10 E10.9, E11.9).   fluconazole 100 MG tablet Commonly known as: Diflucan Take 1 tablet (100 mg total) by mouth daily for 8 days.   glucagon 1 MG injection Commonly known as: Glucagon Emergency Inject 1 mg into the muscle once as needed (For very low blood sugars. May repeat in 15-20 minutes if needed.).   Insulin Pen Needle 31G X 5 MM Misc 30 Units by Does not apply route at bedtime.   insulin pump Soln Inject 1 each into the skin continuous. Novolog insulin. Pt puts in her insulin pump: Medtronic 630G. Carb ratio 1-10, insulin sensitive 66. 1.10 from 12 to 12. Every 3 days needs to be replaced.   lisinopril 2.5 MG tablet Commonly known as: ZESTRIL Take 2.5 mg by mouth daily.   metoCLOPramide 10 MG tablet Commonly known as: REGLAN Take 1 tablet (10 mg total) by mouth 3 (three) times  daily with meals. What changed:   when to take this  reasons to take this   ondansetron 4 MG disintegrating tablet Commonly known as: Zofran ODT Take 1 tablet (4 mg total) by mouth every 8 (eight) hours as needed for nausea or vomiting.   promethazine 25 MG suppository Commonly known as: PHENERGAN Place 1 suppository (25 mg total) rectally every 6 (six) hours as needed for nausea or vomiting.       Allergies  Allergen Reactions  . Peanut-Containing Drug  Products Swelling and Other (See Comments)    Reaction:  Swelling of mouth and lips   . 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   Reaction:  Swelling of mouth and lips   . Ultram [Tramadol] Itching    Consultations:     Procedures/Studies: US Abdomen Complete  Result Date: 11/04/2018 CLINICAL DATA:  Nausea and vomiting. EXAM: ABDOMEN ULTRASOUND COMPLETE COMPARISON:  CT 10/04/2018 FINDINGS: Gallbladder: Surgically absent. Common bile duct: Diameter: 3 mm. Liver: Subtle liver lesions on prior CT are not defined sonographically. Within normal limits in parenchymal echogenicity. Portal vein is patent on color Doppler imaging with normal direction of blood flow towards the liver. IVC: No abnormality visualized. Pancreas: Visualized portion unremarkable. Spleen: Size and appearance within normal limits. Right Kidney: Length: 10.5 cm. Hydronephrosis on prior exam has resolved. Echogenicity within normal limits. Left Kidney: Length: 10.5 cm. Echogenicity within normal limits. No mass or hydronephrosis visualized. Abdominal aorta: No aneurysm visualized. Other findings: None. IMPRESSION: 1. Post cholecystectomy without biliary dilatation. 2. Resolved right hydronephrosis from CT last month. 3. Liver lesions on prior CT are not defined sonographically. Electronically Signed   By: Keith Rake M.D.   On: 11/04/2018 21:33   US Renal  Result Date: 11/07/2018 CLINICAL DATA:  Urinary retention, RIGHT-side pain, history type I diabetes mellitus, hypertension EXAM: RENAL / URINARY TRACT ULTRASOUND COMPLETE COMPARISON:  Renal ultrasound 10/06/2018 FINDINGS: Right Kidney: Renal measurements: 11.7 x 5.1 x 5.0 cm = volume: 156 mL. Normal cortical thickness. Increased cortical echogenicity. Mild RIGHT hydronephrosis. Mild thickening of the wall of the renal pelvis, 3 mm thick. No renal mass or shadowing calcification. Left Kidney: Renal measurements: 10.0 x 5.3 x 4.4 cm =  volume: 125 mL. Normal cortical thickness. Increased cortical echogenicity. No mass, hydronephrosis, or shadowing calcification. Bladder: Bladder wall appears diffusely thickened and irregular. Scattered debris within bladder. No focal bladder mass. Other: N/A IMPRESSION: Diffuse bladder wall thickening and irregularity, associated with significant debris within bladder, question cystitis; recommend correlation with urinalysis. Mild RIGHT hydronephrosis with thickening of the wall of the RIGHT renal pelvis, can be seen with pyelonephritis/chronic infection. Medical renal disease changes of both kidneys, suspect related to history of type I diabetes mellitus. Electronically Signed   By: Lavonia Dana M.D.   On: 11/07/2018 18:09   Dg Chest Portable 2 Views  Result Date: 11/14/2018 CLINICAL DATA:  Status post central line placement EXAM: PORTABLE CHEST 1 VIEW COMPARISON:  07/15/2016 FINDINGS: Cardiac shadows within normal limits. Right jugular central line is noted with catheter tip in the mid superior vena cava. No pneumothorax is seen. No focal infiltrate is noted. IMPRESSION: Noted thorax following central line placement is Electronically Signed   By: Inez Catalina M.D.   On: 11/14/2018 00:18   Dg Chest Port 1 View  Result Date: 11/14/2018 CLINICAL DATA:  Order for encounter for central line. EXAM: PORTABLE CHEST 1 VIEW COMPARISON:  Chest radiograph 11/14/2018 FINDINGS: Stable  right central venous catheter with tip projecting over the mid SVC. Normal cardiomediastinal contours. The lungs are clear. No pneumothorax or large pleural effusion. No acute finding in the visualized skeleton. IMPRESSION: Stable appearance of a right central venous catheter. Electronically Signed   By: Audie Pinto M.D.   On: 11/14/2018 09:48   Vas [place] Upper Extremity Venous Duplex  Result Date: 11/15/2018 UPPER VENOUS STUDY  Indications: Pain, and Swelling Risk Factors: None identified. Limitations: Poor ultrasound/tissue  interface. Comparison Study: No prior studies. Performing Technologist: Oliver Hum RVT  Examination Guidelines: A complete evaluation includes B-mode imaging, spectral Doppler, color Doppler, and power Doppler as needed of all accessible portions of each vessel. Bilateral testing is considered an integral part of a complete examination. Limited examinations for reoccurring indications may be performed as noted.  Right Findings: +----------+------------+---------+-----------+----------+-------+ RIGHT     CompressiblePhasicitySpontaneousPropertiesSummary +----------+------------+---------+-----------+----------+-------+ Subclavian    Full       Yes       Yes                      +----------+------------+---------+-----------+----------+-------+  Left Findings: +----------+------------+---------+-----------+----------+-----------------+ LEFT      CompressiblePhasicitySpontaneousProperties     Summary      +----------+------------+---------+-----------+----------+-----------------+ IJV           Full       Yes       Yes                                +----------+------------+---------+-----------+----------+-----------------+ Subclavian    Full       Yes       Yes                                +----------+------------+---------+-----------+----------+-----------------+ Axillary      Full       Yes       Yes                                +----------+------------+---------+-----------+----------+-----------------+ Brachial      Full       Yes       Yes                                +----------+------------+---------+-----------+----------+-----------------+ Radial        Full                                                    +----------+------------+---------+-----------+----------+-----------------+ Ulnar         Full                                                    +----------+------------+---------+-----------+----------+-----------------+  Cephalic      None                                  Age Indeterminate +----------+------------+---------+-----------+----------+-----------------+ Basilic  Full                                                    +----------+------------+---------+-----------+----------+-----------------+  Summary:  Right: No evidence of thrombosis in the subclavian.  Left: No evidence of deep vein thrombosis in the upper extremity. Findings consistent with age indeterminate superficial vein thrombosis involving the left cephalic vein.  *See table(s) above for measurements and observations.  Diagnosing physician: Deitra Mayo MD Electronically signed by Deitra Mayo MD on 11/15/2018 at 8:56:14 AM.    Final    [place] Ekg Site Rite  Result Date: 11/07/2018 If Site Rite image not attached, placement could not be confirmed due to current cardiac rhythm.      Subjective: Patient is feeling better.  No nausea or vomiting.  Tolerating solid diet.  Wants to go home  Discharge Exam: Vitals:   11/18/18 0343 11/18/18 0735 11/18/18 1203 11/18/18 1559  BP: 107/74 119/83 99/76 107/72  Pulse: 88 (!) 102 99 (!) 108  Resp: 16 16 16 18   Temp: 98.9 F (37.2 C) 99 F (37.2 C) 98.6 F (37 C) 98.8 F (37.1 C)  TempSrc: Oral Oral Oral Oral  SpO2: 99% 100% 100% 100%  Weight:      Height:        General: Pt is alert, awake, not in acute distress Cardiovascular: RRR, S1/S2 +, no rubs, no gallops Respiratory: CTA bilaterally, no wheezing, no rhonchi Abdominal: Soft, NT, ND, bowel sounds + Extremities: no edema, no cyanosis    The results of significant diagnostics from this hospitalization (including imaging, microbiology, ancillary and laboratory) are listed below for reference.     Microbiology: Recent Results (from the past 240 hour(s))  SARS CORONAVIRUS 2 (TAT 6-24 HRS) Nasopharyngeal Nasopharyngeal Swab     Status: None   Collection Time: 11/13/18  4:59 PM   Specimen:  Nasopharyngeal Swab  Result Value Ref Range Status   SARS Coronavirus 2 NEGATIVE NEGATIVE Final    Comment: (NOTE) SARS-CoV-2 target nucleic acids are NOT DETECTED. The SARS-CoV-2 RNA is generally detectable in upper and lower respiratory specimens during the acute phase of infection. Negative results do not preclude SARS-CoV-2 infection, do not rule out co-infections with other pathogens, and should not be used as the sole basis for treatment or other patient management decisions. Negative results must be combined with clinical observations, patient history, and epidemiological information. The expected result is Negative. Fact Sheet for Patients: SugarRoll.be Fact Sheet for Healthcare Providers: https://www.woods-mathews.com/ This test is not yet approved or cleared by the [place] FDA and  has been authorized for detection and/or diagnosis of SARS-CoV-2 by FDA under an Emergency Use Authorization (EUA). This EUA will remain  in effect (meaning this test can be used) for the duration of the COVID-19 declaration under Section 56 4(b)(1) of the Act, 21 U.S.C. section 360bbb-3(b)(1), unless the authorization is terminated or revoked sooner. Performed at [facility], [place] [address] [place], [place] [address]      Labs: BNP (last 3 results) No results for input(s): BNP in the last 8760 hours. Basic Metabolic Panel: Recent Labs  Lab 11/13/18 1925  11/15/18 2132 11/16/18 0209 11/16/18 0500 11/17/18 0506 11/18/18 0330  NA 134*   < > 134* 134* 134* 133* 133*  K 5.3*   < > 3.4* 3.0* 3.8 3.5 4.1  CL 103   < > 105 107 106 107 102  CO2 12*   < > 21* 22 21* 21* 25  GLUCOSE 403*   < > 148* 158* 134* 48* 310*  BUN 9   < > <5* <5* 5* 6 8  CREATININE 0.91   < > 0.51 0.55 0.59 0.72 0.69  CALCIUM 8.8*   < > 8.1* 7.9* 8.2* 8.4* 8.4*  MG 1.5*  --   --   --   --   --   --    < > = values in this interval not displayed.   Liver  Function Tests: Recent Labs  Lab 11/12/18 2341 11/13/18 1925  AST 17 21  ALT 10 12  ALKPHOS 107 93  BILITOT 0.5 1.0  PROT 8.5* 6.7  ALBUMIN 4.1 3.4*   Recent Labs  Lab 11/12/18 2341 11/13/18 1925  LIPASE 19 13   No results for input(s): AMMONIA in the last 168 hours. CBC: Recent Labs  Lab 11/12/18 2341 11/13/18 1925 11/14/18 0443  WBC 10.4 18.1* 17.0*  NEUTROABS  --  15.2*  --   HGB 12.8 13.1 9.9*  HCT 39.7 40.2 32.0*  MCV 88.4 87.0 92.2  PLT 376 385 381   Cardiac Enzymes: No results for input(s): CKTOTAL, CKMB, CKMBINDEX, TROPONINI in the last 168 hours. BNP: Invalid input(s): POCBNP CBG: Recent Labs  Lab 11/17/18 1702 11/17/18 2109 11/18/18 0803 11/18/18 1200 11/18/18 1648  GLUCAP 261* 198* 321* 143* 220*   D-Dimer No results for input(s): DDIMER in the last 72 hours. Hgb A1c No results for input(s): HGBA1C in the last 72 hours. Lipid Profile No results for input(s): CHOL, HDL, LDLCALC, TRIG, CHOLHDL, LDLDIRECT in the last 72 hours. Thyroid function studies No results for input(s): TSH, T4TOTAL, T3FREE, THYROIDAB in the last 72 hours.  Invalid input(s): FREET3 Anemia work up No results for input(s): VITAMINB12, FOLATE, FERRITIN, TIBC, IRON, RETICCTPCT in the last 72 hours. Urinalysis    Component Value Date/Time   COLORURINE YELLOW 11/07/2018 0829   APPEARANCEUR TURBID (A) 11/07/2018 0829   LABSPEC 1.012 11/07/2018 0829   PHURINE 6.0 11/07/2018 0829   GLUCOSEU >=500 (A) 11/07/2018 0829   GLUCOSEU >=1000 11/07/2012 1205   HGBUR MODERATE (A) 11/07/2018 0829   BILIRUBINUR NEGATIVE 11/07/2018 0829   KETONESUR 80 (A) 11/07/2018 0829   KETONESUR 80 (A) 11/07/2018 0829   PROTEINUR 100 (A) 11/07/2018 0829   UROBILINOGEN 0.2 11/24/2014 1045   NITRITE NEGATIVE 11/07/2018 0829   LEUKOCYTESUR LARGE (A) 11/07/2018 0829   Sepsis Labs Invalid input(s): PROCALCITONIN,  WBC,  LACTICIDVEN Microbiology Recent Results (from the past 240 hour(s))  SARS  CORONAVIRUS 2 (TAT 6-24 HRS) Nasopharyngeal Nasopharyngeal Swab     Status: None   Collection Time: 11/13/18  4:59 PM   Specimen: Nasopharyngeal Swab  Result Value Ref Range Status   SARS Coronavirus 2 NEGATIVE NEGATIVE Final    Comment: (NOTE) SARS-CoV-2 target nucleic acids are NOT DETECTED. The SARS-CoV-2 RNA is generally detectable in upper and lower respiratory specimens during the acute phase of infection. Negative results do not preclude SARS-CoV-2 infection, do not rule out co-infections with other pathogens, and should not be used as the sole basis for treatment or other patient management decisions. Negative results must be combined with clinical observations, patient history, and epidemiological information. The expected result is Negative. Fact Sheet for Patients: SugarRoll.be Fact Sheet for Healthcare Providers: https://www.woods-mathews.com/ This test is not yet approved or cleared by the [place] FDA and  has been authorized for detection and/or diagnosis of SARS-CoV-2 by FDA under an Emergency Use Authorization (EUA). This EUA will remain  in effect (meaning this test can be used) for the duration of the COVID-19 declaration under Section 56 4(b)(1) of the Act, 21 U.S.C. section 360bbb-3(b)(1), unless the authorization is terminated or revoked sooner. Performed at [facility], [place] [address], [place], [place] [address]      Time coordinating discharge: 34mns  SIGNED:   JKathie Dike MD  [facility] 11/18/2018, 10:02 PM   If 7PM-7AM, please contact night-coverage [email or web address]

## 2018-11-19 ENCOUNTER — Encounter (HOSPITAL_COMMUNITY): Payer: Self-pay | Admitting: Emergency Medicine

## 2018-11-19 ENCOUNTER — Emergency Department (HOSPITAL_COMMUNITY)
Admission: EM | Admit: 2018-11-19 | Discharge: 2018-11-20 | Disposition: A | Discharge status: Home / Self Care | Payer: Medicaid Other | Attending: Emergency Medicine | Admitting: Emergency Medicine

## 2018-11-19 ENCOUNTER — Other Ambulatory Visit: Payer: Self-pay

## 2018-11-19 DIAGNOSIS — R1115 Cyclical vomiting syndrome unrelated to migraine: Secondary | ICD-10-CM | POA: Diagnosis not present

## 2018-11-19 DIAGNOSIS — Z79899 Other long term (current) drug therapy: Secondary | ICD-10-CM | POA: Insufficient documentation

## 2018-11-19 DIAGNOSIS — R1010 Upper abdominal pain, unspecified: Secondary | ICD-10-CM | POA: Diagnosis present

## 2018-11-19 DIAGNOSIS — F1210 Cannabis abuse, uncomplicated: Secondary | ICD-10-CM | POA: Diagnosis not present

## 2018-11-19 DIAGNOSIS — J45909 Unspecified asthma, uncomplicated: Secondary | ICD-10-CM | POA: Insufficient documentation

## 2018-11-19 DIAGNOSIS — I5032 Chronic diastolic (congestive) heart failure: Secondary | ICD-10-CM | POA: Insufficient documentation

## 2018-11-19 DIAGNOSIS — E119 Type 2 diabetes mellitus without complications: Secondary | ICD-10-CM | POA: Diagnosis not present

## 2018-11-19 DIAGNOSIS — Z794 Long term (current) use of insulin: Secondary | ICD-10-CM | POA: Insufficient documentation

## 2018-11-19 DIAGNOSIS — Z91010 Allergy to peanuts: Secondary | ICD-10-CM | POA: Diagnosis not present

## 2018-11-19 DIAGNOSIS — I110 Hypertensive heart disease with heart failure: Secondary | ICD-10-CM | POA: Diagnosis not present

## 2018-11-19 MED ORDER — SODIUM CHLORIDE 0.9 % IV BOLUS
1000.0000 mL | Freq: Once | INTRAVENOUS | Status: AC
  Administered 2018-11-20: 1000 mL via INTRAVENOUS

## 2018-11-19 MED ORDER — ONDANSETRON HCL 4 MG/2ML IJ SOLN
4.0000 mg | Freq: Once | INTRAMUSCULAR | Status: AC
  Administered 2018-11-20: 4 mg via INTRAVENOUS
  Filled 2018-11-19: qty 2

## 2018-11-19 MED ORDER — PROMETHAZINE HCL 25 MG/ML IJ SOLN
25.0000 mg | Freq: Once | INTRAMUSCULAR | Status: AC
  Administered 2018-11-20: 25 mg via INTRAVENOUS
  Filled 2018-11-19: qty 1

## 2018-11-19 MED ORDER — LORAZEPAM 2 MG/ML IJ SOLN
1.0000 mg | Freq: Once | INTRAMUSCULAR | Status: AC
  Administered 2018-11-20: 01:00:00 1 mg via INTRAVENOUS
  Filled 2018-11-19: qty 1

## 2018-11-19 MED ORDER — SODIUM CHLORIDE 0.9% FLUSH: 3.0000 mL | Freq: Once | INTRAVENOUS | Status: DC

## 2018-11-19 MED ORDER — ONDANSETRON 4 MG PO TBDP: 4.0000 mg | ORAL_TABLET | Freq: Once | ORAL | Status: DC | PRN

## 2018-11-19 NOTE — ED Triage Notes (Signed)
Patient complaining of nausea, vomiting, and abdominal pain since she was discharged. Patient states she took her medication and it is not working.

## 2018-11-20 LAB — COMPREHENSIVE METABOLIC PANEL
ALT: 11 U/L (ref 0–44)
AST: 13 U/L — ABNORMAL LOW (ref 15–41)
Albumin: 3.7 g/dL (ref 3.5–5.0)
Alkaline Phosphatase: 88 U/L (ref 38–126)
Anion gap: 12 (ref 5–15)
BUN: 10 mg/dL (ref 6–20)
CO2: 26 mmol/L (ref 22–32)
Calcium: 9.5 mg/dL (ref 8.9–10.3)
Chloride: 103 mmol/L (ref 98–111)
Creatinine, Ser: 0.67 mg/dL (ref 0.44–1.00)
GFR calc Af Amer: >60 mL/min (ref >60)
GFR calc non Af Amer: >60 mL/min (ref >60)
Glucose, Bld: 116 mg/dL — ABNORMAL HIGH (ref 70–99)
Potassium: 3.3 mmol/L — ABNORMAL LOW (ref 3.5–5.1)
Sodium: 141 mmol/L (ref 135–145)
Total Bilirubin: 0.5 mg/dL (ref 0.3–1.2)
Total Protein: 7.6 g/dL (ref 6.5–8.1)

## 2018-11-20 LAB — CBC
HCT: 34.4 % — ABNORMAL LOW (ref 36.0–46.0)
Hemoglobin: 10.8 g/dL — ABNORMAL LOW (ref 12.0–15.0)
MCH: 28.4 pg (ref 26.0–34.0)
MCHC: 31.4 g/dL (ref 30.0–36.0)
MCV: 90.5 fL (ref 80.0–100.0)
Platelets: 478 10*3/uL — ABNORMAL HIGH (ref 150–400)
RBC: 3.8 MIL/uL — ABNORMAL LOW (ref 3.87–5.11)
RDW: 14 % (ref 11.5–15.5)
WBC: 11.4 10*3/uL — ABNORMAL HIGH (ref 4.0–10.5)
nRBC: 0 % (ref 0.0–0.2)

## 2018-11-20 LAB — LIPASE, BLOOD: Lipase: 16 U/L (ref 11–51)

## 2018-11-20 MED ORDER — MORPHINE SULFATE (PF) 4 MG/ML IV SOLN
4.0000 mg | Freq: Once | INTRAVENOUS | Status: AC
  Administered 2018-11-20: 4 mg via INTRAVENOUS
  Filled 2018-11-20: qty 1

## 2018-11-20 NOTE — ED Notes (Signed)
Pt states she is unable to give a urine sample.

## 2018-11-20 NOTE — ED Provider Notes (Addendum)
[facility] DEPT Provider Note   CSN: [id number] Arrival date & time: 11/19/18  2107     History   Chief Complaint Chief Complaint  Patient presents with  . Abdominal Pain  . Nausea  . Emesis    HPI [name] is a 28 y.o. female.     Patient presents to the emergency department for evaluation of abdominal pain with nausea and vomiting.  Patient has a history of cyclic vomiting syndrome with frequent visits to the emergency department.  She was hospitalized within the past week for this.  Patient reports that she had onset of nausea and vomiting this evening and has not been able to hold anything down.  Patient complains of severe upper abdominal pain associated with vomiting.     Past Medical History:  Diagnosis Date  . Anxiety   . Arthritis    "hands, feet, knees" (12/18/2016)  . Asthma   . Diabetic gastroparesis ([place])    Per gastric emptying study 07/09/16 which showed significant delayed gastric emptying.  . Gallstones   . Gastroparesis   . GERD (gastroesophageal reflux disease)   . Heart murmur   . Hepatic steatosis 11/26/2014   and hepatomegaly  . Hypertension    hx (12/18/2016)  . Intractable cyclical vomiting syndrome    Archie Endo 12/18/2016  . Liver mass 11/26/2014  . Pancreatitis, acute 11/26/2014  . Pneumonia    "as a teen X 1" (12/18/2016)  . Type I diabetes mellitus ([place]) 2007   IDDM.  poorly controlled, multiple admits with DKA    Patient Active Problem List   Diagnosis Date Noted  . Diabetes mellitus ([place]) 11/13/2018  . HTN (hypertension) 11/13/2018  . Marijuana abuse 11/13/2018  . Acute pyelonephritis 10/07/2018  . Hydronephrosis of right kidney 10/05/2018  . Diffuse pain   . Elevated blood pressure reading 12/03/2017  . Tachycardia 11/30/2017  . Hypernatremia 11/02/2017  . Nausea and vomiting 01/22/2017  . Nausea & vomiting 01/21/2017  . Intractable cyclical vomiting syndrome 12/18/2016  . Leukocytosis  10/24/2016  . N&V (nausea and vomiting) 10/23/2016  . DKA, type 1 ([place]) 09/26/2016  . Thrombocytosis ([place]) 07/08/2016  . Candiduria, asymptomatic 06/23/2016  . Hyponatremia 06/13/2016  . Hematuria 05/13/2016  . UTI (urinary tract infection) 01/22/2016  . Diarrhea 11/09/2015  . Acute urinary retention   . Gastroparesis due to DM ([place]) 07/10/2015  . GERD (gastroesophageal reflux disease) 07/10/2015  . Depression with anxiety 07/10/2015  . Gastroparesis 06/22/2015  . Altered mental status 06/22/2015  . Volume depletion 06/10/2015  . Protein-calorie malnutrition, severe 06/10/2015  . Hyperglycemia   . Elevated bilirubin   . Hematemesis with nausea   . Intractable nausea and vomiting 05/28/2015  . Abdominal pain in female   . Abdominal pain 05/24/2015  . Hypertension 05/24/2015  . Dehydration   . Diabetic gastroparesis ([place])   . Chronic diastolic heart failure ([place]) 04/11/2015  . Hematemesis 04/08/2015  . DKA (diabetic ketoacidoses) ([place]) 03/22/2015  . S/P laparoscopic cholecystectomy 02/11/2015  . Postextubation stridor   . Pancreatitis, acute 11/26/2014  . Volume overload 11/26/2014  . Hypokalemia 11/26/2014  . Hepatic steatosis 11/26/2014  . Liver mass 11/26/2014  . Sepsis ([place]) 11/25/2014  . Sinus tachycardia 11/25/2014  . Hypomagnesemia 11/25/2014  . Hypophosphatemia 11/25/2014  . Elevated LFTs 11/24/2014  . AKI (acute kidney injury) ([place]) 11/24/2014  . Migraine headache 11/24/2014  . Asthma 06/29/2012  . Uncontrolled type 1 diabetes mellitus ([place]) 06/19/2010  . Goiter, unspecified 06/19/2010  Past Surgical History:  Procedure Laterality Date  . CHOLECYSTECTOMY N/A 02/11/2015   Procedure: LAPAROSCOPIC CHOLECYSTECTOMY WITH INTRAOPERATIVE CHOLANGIOGRAM;  Surgeon: Greer Pickerel, MD;  Location: [facility] ORS;  Service: General;  Laterality: N/A;  . ESOPHAGOGASTRODUODENOSCOPY (EGD) WITH PROPOFOL Left 09/20/2014   Procedure: ESOPHAGOGASTRODUODENOSCOPY (EGD) WITH PROPOFOL;   Surgeon: Arta Silence, MD;  Location: [facility] ENDOSCOPY;  Service: Endoscopy;  Laterality: Left;  . WISDOM TOOTH EXTRACTION       OB History    Gravida  2   Para  1   Term  0   Preterm  1   AB  1   Living  1     SAB  0   TAB  1   Ectopic  0   Multiple  0   Live Births  1            Home Medications    Prior to Admission medications   Medication Sig Start Date End Date Taking? Authorizing Provider  fluconazole (DIFLUCAN) 100 MG tablet Take 1 tablet (100 mg total) by mouth daily for 8 days. 11/11/18 11/20/18 Yes Swayze, Ava, DO  lisinopril (ZESTRIL) 2.5 MG tablet Take 2.5 mg by mouth daily.   Yes [provider]  metoCLOPramide (REGLAN) 10 MG tablet Take 1 tablet (10 mg total) by mouth 3 (three) times daily with meals. 11/18/18 11/18/19 Yes Kathie Dike, MD  ondansetron (ZOFRAN ODT) 4 MG disintegrating tablet Take 1 tablet (4 mg total) by mouth every 8 (eight) hours as needed for nausea or vomiting. 11/18/18  Yes Kathie Dike, MD  promethazine (PHENERGAN) 25 MG suppository Place 1 suppository (25 mg total) rectally every 6 (six) hours as needed for nausea or vomiting. 10/16/18  Yes Arrien, Jimmy Picket, MD  trimethoprim (TRIMPEX) 100 MG tablet Take 100 mg by mouth daily. 11/11/18  Yes [provider]  albuterol (PROVENTIL HFA;VENTOLIN HFA) 108 (90 Base) MCG/ACT inhaler Inhale 1-2 puffs into the lungs every 6 (six) hours as needed for wheezing or shortness of breath.    [provider]  blood glucose meter kit and supplies Dispense based on patient and insurance preference. Use up to four times daily as directed. (FOR ICD-10 E10.9, E11.9). 12/07/17   Eugenie Filler, MD  glucagon (GLUCAGON EMERGENCY) 1 MG injection Inject 1 mg into the muscle once as needed (For very low blood sugars. May repeat in 15-20 minutes if needed.). 12/20/16   Hongalgi, Lenis Dickinson, MD  Insulin Human (INSULIN PUMP) SOLN Inject 1 each into the skin continuous. Novolog  insulin. Pt puts in her insulin pump: Medtronic 630G. Carb ratio 1-10, insulin sensitive 66. 1.10 from 12 to 12. Every 3 days needs to be replaced.    [provider]  Insulin Pen Needle 31G X 5 MM MISC 30 Units by Does not apply route at bedtime. 12/07/17   Eugenie Filler, MD    Family History Family History  Problem Relation Age of Onset  . Heart disease Maternal Grandmother   . Heart disease Maternal Grandfather   . Diabetes Mother   . Hyperlipidemia Mother   . Hypertension Father   . Heart disease Father   . Hypertension Paternal Grandmother   . Cancer Paternal Grandfather     Social History Social History   Tobacco Use  . Smoking status: Never Smoker  . Smokeless tobacco: Never Used  Substance Use Topics  . Alcohol use: No  . Drug use: Yes    Types: Marijuana    Comment: 12/18/2016 q  few days"     Allergies   Peanut-containing drug products, Food, and Ultram [tramadol]   Review of Systems Review of Systems  Gastrointestinal: Positive for abdominal pain, nausea and vomiting.  All other systems reviewed and are negative.    Physical Exam Updated Vital Signs BP 111/81   Pulse (!) 108   Temp 99.5 F (37.5 C) (Oral)   Resp (!) 24   Ht 5' 3"  (1.6 m)   Wt 44.9 kg   LMP 11/08/2018   SpO2 99%   BMI 17.54 kg/m   Physical Exam Vitals signs and nursing note reviewed.  Constitutional:      General: She is not in acute distress.    Appearance: Normal appearance. She is well-developed.  HENT:     Head: Normocephalic and atraumatic.     Right Ear: Hearing normal.     Left Ear: Hearing normal.     Nose: Nose normal.  Eyes:     Conjunctiva/sclera: Conjunctivae normal.     Pupils: Pupils are equal, round, and reactive to light.  Neck:     Musculoskeletal: Normal range of motion and neck supple.  Cardiovascular:     Rate and Rhythm: Regular rhythm.     Heart sounds: S1 normal and S2 normal. No murmur. No friction rub. No gallop.   Pulmonary:      Effort: Pulmonary effort is normal. No respiratory distress.     Breath sounds: Normal breath sounds.  Chest:     Chest wall: No tenderness.  Abdominal:     General: Bowel sounds are normal.     Palpations: Abdomen is soft.     Tenderness: There is abdominal tenderness in the epigastric area. There is no guarding or rebound. Negative signs include Murphy's sign and McBurney's sign.     Hernia: No hernia is present.  Musculoskeletal: Normal range of motion.  Skin:    General: Skin is warm and dry.     Findings: No rash.  Neurological:     Mental Status: She is alert and oriented to person, place, and time.     GCS: GCS eye subscore is 4. GCS verbal subscore is 5. GCS motor subscore is 6.     Cranial Nerves: No cranial nerve deficit.     Sensory: No sensory deficit.     Coordination: Coordination normal.  Psychiatric:        Speech: Speech normal.        Behavior: Behavior normal.        Thought Content: Thought content normal.      ED Treatments / Results  Labs (all labs ordered are listed, but only abnormal results are displayed) Labs Reviewed  COMPREHENSIVE METABOLIC PANEL - Abnormal; Notable for the following components:      Result Value   Potassium 3.3 (*)    Glucose, Bld 116 (*)    AST 13 (*)    All other components within normal limits  CBC - Abnormal; Notable for the following components:   WBC 11.4 (*)    RBC 3.80 (*)    Hemoglobin 10.8 (*)    HCT 34.4 (*)    Platelets 478 (*)    All other components within normal limits  LIPASE, BLOOD    EKG EKG Interpretation  Date/Time:  Sunday November 20 2018 00:48:31 EST Ventricular Rate:  120 PR Interval:    QRS Duration: 79 QT Interval:  312 QTC Calculation: 441 R Axis:   74 Text Interpretation: Sinus tachycardia LAE, consider  biatrial enlargement Nonspecific T abnormalities, anterior leads No significant change since last tracing Confirmed by Orpah Greek [phone]) on 11/20/2018 6:18:20 AM    Radiology No results found.  Procedures Procedures (including critical care time)  Medications Ordered in ED Medications  sodium chloride 0.9 % bolus 1,000 mL (0 mLs Intravenous Stopped 11/20/18 0230)  ondansetron (ZOFRAN) injection 4 mg (4 mg Intravenous Given 11/20/18 0039)  promethazine (PHENERGAN) injection 25 mg (25 mg Intravenous Given 11/20/18 0036)  LORazepam (ATIVAN) injection 1 mg (1 mg Intravenous Given 11/20/18 0040)  morphine 4 MG/ML injection 4 mg (4 mg Intravenous Given 11/20/18 0306)     Initial Impression / Assessment and Plan / ED Course  I have reviewed the triage vital signs and the nursing notes.  Pertinent labs & imaging results that were available during my care of the patient were reviewed by me and considered in my medical decision making (see chart for details).        Patient presents to the emergency department for evaluation of nausea, vomiting and abdominal pain.  Patient has a history of cyclic vomiting syndrome and frequent ER visits as well as hospitalizations.  In the past her hospitalizations have been precipitated by DKA.  No evidence of DKA tonight.  Patient administered multiple antiemetics and has improved.  No vomiting while here in the ER.  She continues to complain of abdominal pain but has a very benign and soft abdominal exam with normal labs.  She has been hydrated and does not require repeat hospitalization.  Presentation is identical to previous presentations, does not require imaging.  Final Clinical Impressions(s) / ED Diagnoses   Final diagnoses:  Cyclic vomiting syndrome    ED Discharge Orders    None       Orpah Greek, MD 11/20/18 1660    Orpah Greek, MD 11/20/18 [phone]

## 2018-11-21 ENCOUNTER — Encounter (HOSPITAL_COMMUNITY): Payer: Self-pay | Admitting: *Deleted

## 2018-11-21 ENCOUNTER — Other Ambulatory Visit: Payer: Self-pay

## 2018-11-21 ENCOUNTER — Observation Stay (HOSPITAL_COMMUNITY)
Admission: EM | Admit: 2018-11-21 | Discharge: 2018-11-23 | Disposition: A | Discharge status: Home / Self Care | Payer: BC Managed Care – PPO | Attending: Internal Medicine | Admitting: Internal Medicine

## 2018-11-21 DIAGNOSIS — I5032 Chronic diastolic (congestive) heart failure: Secondary | ICD-10-CM | POA: Insufficient documentation

## 2018-11-21 DIAGNOSIS — Z888 Allergy status to other drugs, medicaments and biological substances status: Secondary | ICD-10-CM

## 2018-11-21 DIAGNOSIS — R112 Nausea with vomiting, unspecified: Secondary | ICD-10-CM | POA: Diagnosis present

## 2018-11-21 DIAGNOSIS — I110 Hypertensive heart disease with heart failure: Secondary | ICD-10-CM | POA: Diagnosis not present

## 2018-11-21 DIAGNOSIS — Z885 Allergy status to narcotic agent status: Secondary | ICD-10-CM | POA: Diagnosis not present

## 2018-11-21 DIAGNOSIS — Z794 Long term (current) use of insulin: Secondary | ICD-10-CM | POA: Insufficient documentation

## 2018-11-21 DIAGNOSIS — Z833 Family history of diabetes mellitus: Secondary | ICD-10-CM | POA: Diagnosis not present

## 2018-11-21 DIAGNOSIS — Z87440 Personal history of urinary (tract) infections: Secondary | ICD-10-CM

## 2018-11-21 DIAGNOSIS — N136 Pyonephrosis: Secondary | ICD-10-CM | POA: Diagnosis present

## 2018-11-21 DIAGNOSIS — Z9641 Presence of insulin pump (external) (internal): Secondary | ICD-10-CM | POA: Diagnosis present

## 2018-11-21 DIAGNOSIS — E1043 Type 1 diabetes mellitus with diabetic autonomic (poly)neuropathy: Secondary | ICD-10-CM | POA: Diagnosis not present

## 2018-11-21 DIAGNOSIS — Z20828 Contact with and (suspected) exposure to other viral communicable diseases: Secondary | ICD-10-CM | POA: Diagnosis not present

## 2018-11-21 DIAGNOSIS — N390 Urinary tract infection, site not specified: Secondary | ICD-10-CM | POA: Insufficient documentation

## 2018-11-21 DIAGNOSIS — Z79899 Other long term (current) drug therapy: Secondary | ICD-10-CM | POA: Diagnosis not present

## 2018-11-21 DIAGNOSIS — E1143 Type 2 diabetes mellitus with diabetic autonomic (poly)neuropathy: Secondary | ICD-10-CM

## 2018-11-21 DIAGNOSIS — Z91010 Allergy to peanuts: Secondary | ICD-10-CM

## 2018-11-21 DIAGNOSIS — R109 Unspecified abdominal pain: Secondary | ICD-10-CM | POA: Diagnosis present

## 2018-11-21 DIAGNOSIS — Z8249 Family history of ischemic heart disease and other diseases of the circulatory system: Secondary | ICD-10-CM | POA: Diagnosis not present

## 2018-11-21 DIAGNOSIS — K3184 Gastroparesis: Secondary | ICD-10-CM | POA: Diagnosis not present

## 2018-11-21 DIAGNOSIS — J449 Chronic obstructive pulmonary disease, unspecified: Secondary | ICD-10-CM | POA: Diagnosis present

## 2018-11-21 DIAGNOSIS — K219 Gastro-esophageal reflux disease without esophagitis: Secondary | ICD-10-CM | POA: Diagnosis present

## 2018-11-21 DIAGNOSIS — E1343 Other specified diabetes mellitus with diabetic autonomic (poly)neuropathy: Secondary | ICD-10-CM | POA: Diagnosis present

## 2018-11-21 DIAGNOSIS — M1990 Unspecified osteoarthritis, unspecified site: Secondary | ICD-10-CM | POA: Diagnosis present

## 2018-11-21 DIAGNOSIS — Z91018 Allergy to other foods: Secondary | ICD-10-CM

## 2018-11-21 LAB — URINALYSIS, ROUTINE W REFLEX MICROSCOPIC
Bilirubin Urine: NEGATIVE
Glucose, UA: NEGATIVE mg/dL
Ketones, ur: 15 mg/dL — AB
Nitrite: NEGATIVE
Protein, ur: 30 mg/dL — AB
Specific Gravity, Urine: 1.02 (ref 1.005–1.030)
pH: 8.5 — ABNORMAL HIGH (ref 5.0–8.0)

## 2018-11-21 LAB — CBC
HCT: 41.3 % (ref 36.0–46.0)
Hemoglobin: 13.2 g/dL (ref 12.0–15.0)
MCH: 28.8 pg (ref 26.0–34.0)
MCHC: 32 g/dL (ref 30.0–36.0)
MCV: 90.2 fL (ref 80.0–100.0)
Platelets: 579 10*3/uL — ABNORMAL HIGH (ref 150–400)
RBC: 4.58 MIL/uL (ref 3.87–5.11)
RDW: 13.9 % (ref 11.5–15.5)
WBC: 8.5 10*3/uL (ref 4.0–10.5)
nRBC: 0 % (ref 0.0–0.2)

## 2018-11-21 LAB — I-STAT BETA HCG BLOOD, ED (MC, WL, AP ONLY): I-stat hCG, quantitative: <5 m[IU]/mL (ref <5)

## 2018-11-21 LAB — GLUCOSE, CAPILLARY: Glucose-Capillary: 101 mg/dL — ABNORMAL HIGH (ref 70–99)

## 2018-11-21 LAB — COMPREHENSIVE METABOLIC PANEL
ALT: 11 U/L (ref 0–44)
AST: 16 U/L (ref 15–41)
Albumin: 4.7 g/dL (ref 3.5–5.0)
Alkaline Phosphatase: 109 U/L (ref 38–126)
Anion gap: 14 (ref 5–15)
BUN: 8 mg/dL (ref 6–20)
CO2: 27 mmol/L (ref 22–32)
Calcium: 9.8 mg/dL (ref 8.9–10.3)
Chloride: 98 mmol/L (ref 98–111)
Creatinine, Ser: 0.72 mg/dL (ref 0.44–1.00)
GFR calc Af Amer: >60 mL/min (ref >60)
GFR calc non Af Amer: >60 mL/min (ref >60)
Glucose, Bld: 151 mg/dL — ABNORMAL HIGH (ref 70–99)
Potassium: 3.7 mmol/L (ref 3.5–5.1)
Sodium: 139 mmol/L (ref 135–145)
Total Bilirubin: 1 mg/dL (ref 0.3–1.2)
Total Protein: 9.3 g/dL — ABNORMAL HIGH (ref 6.5–8.1)

## 2018-11-21 LAB — CBG MONITORING, ED: Glucose-Capillary: 128 mg/dL — ABNORMAL HIGH (ref 70–99)

## 2018-11-21 LAB — URINALYSIS, MICROSCOPIC (REFLEX)
Bacteria, UA: NONE SEEN
WBC, UA: >50 WBC/hpf (ref 0–5)

## 2018-11-21 LAB — LIPASE, BLOOD: Lipase: 17 U/L (ref 11–51)

## 2018-11-21 MED ORDER — LISINOPRIL 5 MG PO TABS
2.5000 mg | ORAL_TABLET | Freq: Every day | ORAL | Status: DC
  Administered 2018-11-22 – 2018-11-23 (×2): 2.5 mg via ORAL
  Filled 2018-11-21 (×2): qty 1

## 2018-11-21 MED ORDER — MORPHINE SULFATE (PF) 2 MG/ML IV SOLN
2.0000 mg | Freq: Once | INTRAVENOUS | Status: AC
  Administered 2018-11-21: 12:00:00 2 mg via INTRAVENOUS
  Filled 2018-11-21: qty 1

## 2018-11-21 MED ORDER — SODIUM CHLORIDE 0.45 % IV SOLN
INTRAVENOUS | Status: AC
  Administered 2018-11-21: 21:00:00 via INTRAVENOUS

## 2018-11-21 MED ORDER — ALBUTEROL SULFATE HFA 108 (90 BASE) MCG/ACT IN AERS: 1.0000 | INHALATION_SPRAY | Freq: Four times a day (QID) | RESPIRATORY_TRACT | Status: DC | PRN

## 2018-11-21 MED ORDER — ACETAMINOPHEN 650 MG RE SUPP: 650.0000 mg | Freq: Four times a day (QID) | RECTAL | Status: DC | PRN

## 2018-11-21 MED ORDER — CIPROFLOXACIN IN D5W 200 MG/100ML IV SOLN
200.0000 mg | Freq: Two times a day (BID) | INTRAVENOUS | Status: DC
  Administered 2018-11-21 – 2018-11-22 (×2): 200 mg via INTRAVENOUS
  Filled 2018-11-21 (×2): qty 100

## 2018-11-21 MED ORDER — INSULIN ASPART 100 UNIT/ML ~~LOC~~ SOLN
0.0000 [IU] | Freq: Three times a day (TID) | SUBCUTANEOUS | Status: DC
  Administered 2018-11-22: 12:00:00 3 [IU] via SUBCUTANEOUS
  Administered 2018-11-22: 5 [IU] via SUBCUTANEOUS
  Filled 2018-11-21: qty 0.15

## 2018-11-21 MED ORDER — SODIUM CHLORIDE 0.9% FLUSH
3.0000 mL | Freq: Once | INTRAVENOUS | Status: AC
  Administered 2018-11-21: 12:00:00 3 mL via INTRAVENOUS

## 2018-11-21 MED ORDER — ENOXAPARIN SODIUM 40 MG/0.4ML ~~LOC~~ SOLN
40.0000 mg | Freq: Every day | SUBCUTANEOUS | Status: DC
  Administered 2018-11-21 – 2018-11-22 (×2): 40 mg via SUBCUTANEOUS
  Filled 2018-11-21 (×2): qty 0.4

## 2018-11-21 MED ORDER — HALOPERIDOL LACTATE 5 MG/ML IJ SOLN
5.0000 mg | Freq: Once | INTRAMUSCULAR | Status: AC
  Administered 2018-11-21: 5 mg via INTRAVENOUS
  Filled 2018-11-21: qty 1

## 2018-11-21 MED ORDER — KETOROLAC TROMETHAMINE 15 MG/ML IJ SOLN
15.0000 mg | Freq: Four times a day (QID) | INTRAMUSCULAR | Status: DC | PRN
  Administered 2018-11-22 – 2018-11-23 (×6): 15 mg via INTRAVENOUS
  Filled 2018-11-21 (×6): qty 1

## 2018-11-21 MED ORDER — SODIUM CHLORIDE 0.9 % IV BOLUS
1000.0000 mL | Freq: Once | INTRAVENOUS | Status: AC
  Administered 2018-11-21: 15:00:00 1000 mL via INTRAVENOUS

## 2018-11-21 MED ORDER — HYDROMORPHONE HCL 1 MG/ML IJ SOLN
0.5000 mg | Freq: Once | INTRAMUSCULAR | Status: AC
  Administered 2018-11-21: 0.5 mg via INTRAVENOUS
  Filled 2018-11-21: qty 1

## 2018-11-21 MED ORDER — ONDANSETRON HCL 4 MG/2ML IJ SOLN
4.0000 mg | Freq: Once | INTRAMUSCULAR | Status: AC
  Administered 2018-11-21: 14:00:00 4 mg via INTRAVENOUS
  Filled 2018-11-21: qty 2

## 2018-11-21 MED ORDER — ONDANSETRON 4 MG PO TBDP: 4.0000 mg | ORAL_TABLET | Freq: Three times a day (TID) | ORAL | Status: DC | PRN

## 2018-11-21 MED ORDER — METOCLOPRAMIDE HCL 10 MG PO TABS
10.0000 mg | ORAL_TABLET | Freq: Three times a day (TID) | ORAL | Status: DC
  Administered 2018-11-22 – 2018-11-23 (×5): 10 mg via ORAL
  Filled 2018-11-21 (×5): qty 1

## 2018-11-21 MED ORDER — SODIUM CHLORIDE 0.9 % IV BOLUS
500.0000 mL | Freq: Once | INTRAVENOUS | Status: AC
  Administered 2018-11-21: 500 mL via INTRAVENOUS

## 2018-11-21 MED ORDER — ACETAMINOPHEN 325 MG PO TABS
650.0000 mg | ORAL_TABLET | Freq: Four times a day (QID) | ORAL | Status: DC | PRN
  Administered 2018-11-22: 650 mg via ORAL
  Filled 2018-11-21: qty 2

## 2018-11-21 MED ORDER — TRIMETHOPRIM 100 MG PO TABS: 100.0000 mg | ORAL_TABLET | Freq: Every day | ORAL | Status: DC

## 2018-11-21 MED ORDER — PROMETHAZINE HCL 25 MG/ML IJ SOLN
12.5000 mg | INTRAMUSCULAR | Status: DC
  Administered 2018-11-21 – 2018-11-23 (×11): 12.5 mg via INTRAVENOUS
  Filled 2018-11-21 (×11): qty 1

## 2018-11-21 MED ORDER — PROMETHAZINE HCL 25 MG/ML IJ SOLN
12.5000 mg | Freq: Once | INTRAMUSCULAR | Status: AC
  Administered 2018-11-21: 12.5 mg via INTRAVENOUS
  Filled 2018-11-21: qty 1

## 2018-11-21 MED ORDER — ONDANSETRON HCL 4 MG/2ML IJ SOLN
4.0000 mg | INTRAMUSCULAR | Status: DC
  Administered 2018-11-21 – 2018-11-23 (×11): 4 mg via INTRAVENOUS
  Filled 2018-11-21 (×11): qty 2

## 2018-11-21 NOTE — ED Notes (Signed)
ED Provider at bedside. 

## 2018-11-21 NOTE — ED Notes (Signed)
Pt stated that she is not able to urinate at the moment.

## 2018-11-21 NOTE — ED Triage Notes (Signed)
Nausea, vomiting with abd pain since 0200.

## 2018-11-21 NOTE — H&P (Addendum)
History and Physical    [name] DXI:[id number] DOB: March 05, 1990 DOA: 11/21/2018  PCP: Vicenta Aly, FNP (Confirm with patient/family/NH records and if not entered, this has to be entered at [facility] point of entry) Patient coming from: Patient is coming from home  I have personally briefly reviewed patient's old medical records in [place]  Chief Complaint: Refractory nausea and vomiting, abdominal pain  HPI: [name] is a 28 y.o. female with medical history significant of severe gastroparesis.  Patient's has had 57 ED visits or admissions to the hospital in the last 10 months.  Post recent ED visit 11/19/2018 and most recent admission 11/13/2018.  Patient has insulin-dependent diabetes and is followed by Dr. Zigmund [name] at a [place] clinic.  Her last visit was January 26, 2018.  She has been diagnosed with severe gastroparesis and has been tried on multiple outpatient oral regimens  including a combination of scopolamine patch, Phenergan, Zofran and erythromycin.  She is seen Dr. Paulita Fujita for GI consultation.  She says she has tried to enroll in a gastroparesis study but has been rejected.  Also with history of recurrent UTI including a recent hospitalization for pyelonephritis with urinary retention. since her discharge from the ED 10/31 patient has continued to have nausea, vomiting, abdominal pain and poor p.o. intake.  She has continued with her insulin pump.  Last hemoglobin A1c was greater than 90 days ago and was 8.7%.  Do to her refractory nausea and vomiting she represents to the [facility] long emergency department  ED Course: Patient is hemodynamically stable in the ED.  He has been given rounds of IV Phenergan, IV Zofran, Dilaudid and morphine for pain.  Laboratory results were normal for renal function, CBC.  Urinalysis was positive greater than 50 WBCs per high-powered field, WBCs in clumps.  Patient is referred to [facility] for admission management of refractory nausea vomiting.   Review of Systems: As per HPI otherwise 10 point review of systems negative.    Past Medical History:  Diagnosis Date  . Anxiety   . Arthritis    "hands, feet, knees" (12/18/2016)  . Asthma   . Diabetic gastroparesis ([place])    Per gastric emptying study 07/09/16 which showed significant delayed gastric emptying.  . Gallstones   . Gastroparesis   . GERD (gastroesophageal reflux disease)   . Heart murmur   . Hepatic steatosis 11/26/2014   and hepatomegaly  . Hypertension    hx (12/18/2016)  . Intractable cyclical vomiting syndrome    Archie Endo 12/18/2016  . Liver mass 11/26/2014  . Pancreatitis, acute 11/26/2014  . Pneumonia    "as a teen X 1" (12/18/2016)  . Type I diabetes mellitus ([place]) 2007   IDDM.  poorly controlled, multiple admits with DKA    Past Surgical History:  Procedure Laterality Date  . CHOLECYSTECTOMY N/A 02/11/2015   Procedure: LAPAROSCOPIC CHOLECYSTECTOMY WITH INTRAOPERATIVE CHOLANGIOGRAM;  Surgeon: Greer Pickerel, MD;  Location: [facility];  Service: General;  Laterality: N/A;  . ESOPHAGOGASTRODUODENOSCOPY (EGD) WITH PROPOFOL Left 09/20/2014   Procedure: ESOPHAGOGASTRODUODENOSCOPY (EGD) WITH PROPOFOL;  Surgeon: Arta Silence, MD;  Location: [facility] ENDOSCOPY;  Service: Endoscopy;  Laterality: Left;  . WISDOM TOOTH EXTRACTION     Soc Hx -  HSG, a little college. Single mother of 1 daughter. She lives with her mother, brother and daughter. She works for [employer] as a Sports coach.   reports that she has never smoked. She has never used smokeless tobacco. She reports current drug use. Drug: Marijuana. She reports  that she does not drink alcohol.  Allergies  Allergen Reactions  . Peanut-Containing Drug Products Swelling and Other (See Comments)    Reaction:  Swelling of mouth and lips   . 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   Reaction:  Swelling of mouth and lips   . Ultram [Tramadol] Itching    Family History  Problem Relation Age  of Onset  . Heart disease Maternal Grandmother   . Heart disease Maternal Grandfather   . Diabetes Mother   . Hyperlipidemia Mother   . Hypertension Father   . Heart disease Father   . Hypertension Paternal Grandmother   . Cancer Paternal Grandfather       Prior to Admission medications   Medication Sig Start Date End Date Taking? Authorizing Provider  albuterol (PROVENTIL HFA;VENTOLIN HFA) 108 (90 Base) MCG/ACT inhaler Inhale 1-2 puffs into the lungs every 6 (six) hours as needed for wheezing or shortness of breath.   Yes [provider]  glucagon (GLUCAGON EMERGENCY) 1 MG injection Inject 1 mg into the muscle once as needed (For very low blood sugars. May repeat in 15-20 minutes if needed.). 12/20/16  Yes Hongalgi, Lenis Dickinson, MD  Insulin Human (INSULIN PUMP) SOLN Inject 1 each into the skin continuous. Novolog insulin. Pt puts in her insulin pump: Medtronic 630G. Carb ratio 1-10, insulin sensitive 66. 1.10 from 12 to 12. Every 3 days needs to be replaced.   Yes [provider]  lisinopril (ZESTRIL) 2.5 MG tablet Take 2.5 mg by mouth daily.   Yes [provider]  metoCLOPramide (REGLAN) 10 MG tablet Take 1 tablet (10 mg total) by mouth 3 (three) times daily with meals. 11/18/18 11/18/19 Yes Kathie Dike, MD  ondansetron (ZOFRAN ODT) 4 MG disintegrating tablet Take 1 tablet (4 mg total) by mouth every 8 (eight) hours as needed for nausea or vomiting. 11/18/18  Yes Kathie Dike, MD  trimethoprim (TRIMPEX) 100 MG tablet Take 100 mg by mouth daily. 11/11/18  Yes [provider]  blood glucose meter kit and supplies Dispense based on patient and insurance preference. Use up to four times daily as directed. (FOR ICD-10 E10.9, E11.9). 12/07/17   Eugenie Filler, MD  Insulin Pen Needle 31G X 5 MM MISC 30 Units by Does not apply route at bedtime. 12/07/17   Eugenie Filler, MD  promethazine (PHENERGAN) 25 MG suppository Place 1 suppository (25 mg total)  rectally every 6 (six) hours as needed for nausea or vomiting. Patient not taking: Reported on 11/21/2018 10/16/18   Tawni Millers, MD    Physical Exam: Vitals:   11/21/18 1730 11/21/18 1800 11/21/18 1830 11/21/18 1900  BP: 102/65 101/70 102/66 106/65  Pulse: (!) 104 (!) 103 (!) 101 (!) 101  Resp: 16 17 17 17   Temp:      TempSrc:      SpO2: 96% 100% 99% 98%  Weight:      Height:        Constitutional: NAD, calm, comfortable Vitals:   11/21/18 1730 11/21/18 1800 11/21/18 1830 11/21/18 1900  BP: 102/65 101/70 102/66 106/65  Pulse: (!) 104 (!) 103 (!) 101 (!) 101  Resp: 16 17 17 17   Temp:      TempSrc:      SpO2: 96% 100% 99% 98%  Weight:      Height:       General appearance -well-nourished well-developed woman lying in bed somewhat somnolent but in no  distress  eyes: PERRL, lids and conjunctivae normal ENMT: Mucous membranes are moist. Posterior pharynx clear of any exudate or lesions.Normal dentition.  Neck: normal, supple, no masses, no thyromegaly Respiratory: clear to auscultation bilaterally, no wheezing, no crackles. Normal respiratory effort. No accessory muscle use.  Cardiovascular: Regular rate and rhythm, no murmurs / rubs / gallops. No extremity edema. 2+ pedal pulses. No carotid bruits.  Abdomen: Mild diffuse tenderness, no masses palpated. No hepatosplenomegaly. Bowel sounds hypoactive Musculoskeletal: no clubbing / cyanosis. No joint deformity upper and lower extremities. Good ROM, no contractures. Normal muscle tone.  Skin: no rashes, lesions, ulcers. No induration notable tattoos  neurologic: CN 2-12 grossly intact. Sensation intact. Strength 5/5 in all 4.  Psychiatric: Normal judgment and insight. Alert and oriented x 3. Normal mood.     Labs on Admission: I have personally reviewed following labs and imaging studies  CBC: Recent Labs  Lab 11/20/18 0001 11/21/18 1013  WBC 11.4* 8.5  HGB 10.8* 13.2  HCT 34.4* 41.3  MCV 90.5 90.2  PLT 478*  409*   Basic Metabolic Panel: Recent Labs  Lab 11/16/18 0500 11/17/18 0506 11/18/18 0330 11/20/18 0001 11/21/18 1013  NA 134* 133* 133* 141 139  K 3.8 3.5 4.1 3.3* 3.7  CL 106 107 102 103 98  CO2 21* 21* 25 26 27   GLUCOSE 134* 48* 310* 116* 151*  BUN 5* 6 8 10 8   CREATININE 0.59 0.72 0.69 0.67 0.72  CALCIUM 8.2* 8.4* 8.4* 9.5 9.8   GFR: Estimated Creatinine Clearance: 73.7 mL/min (by C-G formula based on SCr of 0.72 mg/dL). Liver Function Tests: Recent Labs  Lab 11/20/18 0001 11/21/18 1013  AST 13* 16  ALT 11 11  ALKPHOS 88 109  BILITOT 0.5 1.0  PROT 7.6 9.3*  ALBUMIN 3.7 4.7   Recent Labs  Lab 11/20/18 0001 11/21/18 1013  LIPASE 16 17   No results for input(s): AMMONIA in the last 168 hours. Coagulation Profile: No results for input(s): INR, PROTIME in the last 168 hours. Cardiac Enzymes: No results for input(s): CKTOTAL, CKMB, CKMBINDEX, TROPONINI in the last 168 hours. BNP (last 3 results) No results for input(s): PROBNP in the last 8760 hours. HbA1C: No results for input(s): HGBA1C in the last 72 hours. CBG: Recent Labs  Lab 11/17/18 2109 11/18/18 0803 11/18/18 1200 11/18/18 1648 11/21/18 1149  GLUCAP 198* 321* 143* 220* 128*   Lipid Profile: No results for input(s): CHOL, HDL, LDLCALC, TRIG, CHOLHDL, LDLDIRECT in the last 72 hours. Thyroid Function Tests: No results for input(s): TSH, T4TOTAL, FREET4, T3FREE, THYROIDAB in the last 72 hours. Anemia Panel: No results for input(s): VITAMINB12, FOLATE, FERRITIN, TIBC, IRON, RETICCTPCT in the last 72 hours. Urine analysis:    Component Value Date/Time   COLORURINE YELLOW 11/21/2018 1013   APPEARANCEUR TURBID (A) 11/21/2018 1013   LABSPEC 1.020 11/21/2018 1013   PHURINE 8.5 (H) 11/21/2018 1013   GLUCOSEU NEGATIVE 11/21/2018 1013   GLUCOSEU >=1000 11/07/2012 1205   HGBUR MODERATE (A) 11/21/2018 1013   BILIRUBINUR NEGATIVE 11/21/2018 1013   KETONESUR 15 (A) 11/21/2018 1013   PROTEINUR 30 (A)  11/21/2018 1013   UROBILINOGEN 0.2 11/24/2014 1045   NITRITE NEGATIVE 11/21/2018 1013   LEUKOCYTESUR LARGE (A) 11/21/2018 1013    Radiological Exams on Admission: No results found.  EKG: Independently reviewed.  EKG revealed a sinus tachycardia, possible left atrial enlargement.  No acute changes noted  Assessment/Plan Active Problems:   Diabetic gastroparesis (HCC)   Intractable nausea and vomiting  Chronic diastolic heart failure (HCC)   Abdominal pain   Gastroparesis   Gastroparesis due to secondary diabetes ([place])  (please populate well all problems here in Problem List. (For example, if patient is on BP meds at home and you resume or decide to hold them, it is a problem that needs to be her. Same for CAD, COPD, HLD and so on)   1.  Diabetic gastroparesis -this is a chronic problem for this patient.  She has been very difficult to control spite multiple outpatient attempts at multidrug regimens.  She has been seen by GI who has no significant additions to her current regimen.  She has had numerous hospitalizations.  By her report she is adherent to her medical regimen. Plan duration admission  IV fluids for rehydration  IV Phenergan every 4 hours alternating with IV Zofran every 4 hours, continue p.o. Reglan 10 mg 3   times daily   2.  Abdominal pain -directly related to her gastroparesis.  Acute abdominal symptoms.  Will avoid narcotics. Plan ketorolac 15 mg IV every 6 as needed  3. Insulin-dependent diabetes -last hemoglobin A1c greater than 90 days ago 8.7%.  She reports she does continue to use her insulin pump denies having any problems with this recently.  He does follow with Dr. Zigmund [name] for endocrinology last visit in January 2020 Plan off insulin pump while inpatient  Lantus 20 units every 24 hours  Adding scale coverage while inpatient  4.  Chronic diastolic heart failure -patient is asymptomatic.  Examinations unremarkable.  2D echo November 08, 2018 revealed a left  ventricular ejection fraction of 60 to 65%, mild tricuspid valve regurgitation and otherwise normal.  No evidence of impaired relaxation phase  5. UTI - positive U/A Plan Cipro 200 mg IV q 12 x 6 doses - convert to po if discharged before completion.  DVT prophylaxis: Lovenox (Lovenox/Heparin/SCD's/anticoagulated/None (if comfort care) Code Status: Code (Full/Partial (specify details) Family Communication: spoke with mother: to encourage small meals. (Specify name, relationship. Do not write "discussed with patient". Specify tel # if discussed over the phone) Disposition Plan: home when stable (specify when and where you expect patient to be discharged) Consults called: none (with names) Admission status: observation (inpatient / obs / tele / medical floor / SDU)   Adella Hare MD [facility] Pager [phone]  If 7PM-7AM, please contact night-coverage [email or web address] Password [facility]  11/21/2018, 7:56 PM

## 2018-11-21 NOTE — ED Notes (Signed)
ED TO INPATIENT HANDOFF REPORT  ED Nurse Name and Phone #:   S Name/Age/Gender [name] [age] y.o. female Room/Bed: WA10/WA10  Code Status   Code Status: Full Code  Home/SNF/Other Home Patient oriented to: self, place, time and situation Is this baseline? Yes   Triage Complete: Triage complete  Chief Complaint emesis   Triage Note Nausea, vomiting with abd pain since 0200.   Allergies Allergies  Allergen Reactions  . Peanut-Containing Drug Products Swelling and Other (See Comments)    Reaction:  Swelling of mouth and lips   . 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   Reaction:  Swelling of mouth and lips   . Ultram [Tramadol] Itching    Level of Care/Admitting Diagnosis ED Disposition    ED Disposition Condition [facility] Area: [place] [[id number]]  Level of Care: Med-Surg [16]  Covid Evaluation: Asymptomatic Screening Protocol (No Symptoms)  Diagnosis: Gastroparesis due to secondary diabetes [facility]) [id number]  Admitting Physician: Neena Rhymes [[id number]]  Attending Physician: Adella Hare E [[id number]]  PT Class (Do Not Modify): Observation [104]  PT Acc Code (Do Not Modify): Observation [10022]       B Medical/Surgery History Past Medical History:  Diagnosis Date  . Anxiety   . Arthritis    "hands, feet, knees" (12/18/2016)  . Asthma   . Diabetic gastroparesis ([place])    Per gastric emptying study 07/09/16 which showed significant delayed gastric emptying.  . Gallstones   . Gastroparesis   . GERD (gastroesophageal reflux disease)   . Heart murmur   . Hepatic steatosis 11/26/2014   and hepatomegaly  . Hypertension    hx (12/18/2016)  . Intractable cyclical vomiting syndrome    Archie Endo 12/18/2016  . Liver mass 11/26/2014  . Pancreatitis, acute 11/26/2014  . Pneumonia    "as a teen X 1" (12/18/2016)  . Type I diabetes mellitus ([place]) 2007   IDDM.  poorly controlled, multiple  admits with DKA   Past Surgical History:  Procedure Laterality Date  . CHOLECYSTECTOMY N/A 02/11/2015   Procedure: LAPAROSCOPIC CHOLECYSTECTOMY WITH INTRAOPERATIVE CHOLANGIOGRAM;  Surgeon: Greer Pickerel, MD;  Location: [facility]S;  Service: General;  Laterality: N/A;  . ESOPHAGOGASTRODUODENOSCOPY (EGD) WITH PROPOFOL Left 09/20/2014   Procedure: ESOPHAGOGASTRODUODENOSCOPY (EGD) WITH PROPOFOL;  Surgeon: Arta Silence, MD;  Location: [facility] ENDOSCOPY;  Service: Endoscopy;  Laterality: Left;  . WISDOM TOOTH EXTRACTION       A IV Location/Drains/Wounds Patient Lines/Drains/Airways Status   Active Line/Drains/Airways    Name:   Placement date:   Placement time:   Site:   Days:   Peripheral IV Right Arm   -    -    Arm             Intake/Output Last 24 hours No intake or output data in the 24 hours ending 11/21/18 2103  Labs/Imaging Results for orders placed or performed during the hospital encounter of 11/21/18 (from the past 48 hour(s))  Lipase, blood     Status: None   Collection Time: 11/21/18 10:13 AM  Result Value Ref Range   Lipase 17 11 - 51 U/L    Comment: Performed at [facility], [place] [address] [place], [place] [address]  Comprehensive metabolic panel     Status: Abnormal   Collection Time: 11/21/18 10:13 AM  Result Value Ref Range   Sodium 139 135 - 145 mmol/L   Potassium 3.7 3.5 -  5.1 mmol/L   Chloride 98 98 - 111 mmol/L   CO2 27 22 - 32 mmol/L   Glucose, Bld 151 (H) 70 - 99 mg/dL   BUN 8 6 - 20 mg/dL   Creatinine, Ser 0.72 0.44 - 1.00 mg/dL   Calcium 9.8 8.9 - 10.3 mg/dL   Total Protein 9.3 (H) 6.5 - 8.1 g/dL   Albumin 4.7 3.5 - 5.0 g/dL   AST 16 15 - 41 U/L   ALT 11 0 - 44 U/L   Alkaline Phosphatase 109 38 - 126 U/L   Total Bilirubin 1.0 0.3 - 1.2 mg/dL   GFR calc non Af Amer >60 >60 mL/min   GFR calc Af Amer >60 >60 mL/min   Anion gap 14 5 - 15    Comment: Performed at [facility], [place] [address] [place], [place] [address]   CBC     Status: Abnormal   Collection Time: 11/21/18 10:13 AM  Result Value Ref Range   WBC 8.5 4.0 - 10.5 K/uL   RBC 4.58 3.87 - 5.11 MIL/uL   Hemoglobin 13.2 12.0 - 15.0 g/dL   HCT 41.3 36.0 - 46.0 %   MCV 90.2 80.0 - 100.0 fL   MCH 28.8 26.0 - 34.0 pg   MCHC 32.0 30.0 - 36.0 g/dL   RDW 13.9 11.5 - 15.5 %   Platelets 579 (H) 150 - 400 K/uL   nRBC 0.0 0.0 - 0.2 %    Comment: Performed at [facility], [place] [address], [place], [place] [address]  Urinalysis, Routine w reflex microscopic     Status: Abnormal   Collection Time: 11/21/18 10:13 AM  Result Value Ref Range   Color, Urine YELLOW YELLOW   APPearance TURBID (A) CLEAR   Specific Gravity, Urine 1.020 1.005 - 1.030   pH 8.5 (H) 5.0 - 8.0   Glucose, UA NEGATIVE NEGATIVE mg/dL   Hgb urine dipstick MODERATE (A) NEGATIVE   Bilirubin Urine NEGATIVE NEGATIVE   Ketones, ur 15 (A) NEGATIVE mg/dL   Protein, ur 30 (A) NEGATIVE mg/dL   Nitrite NEGATIVE NEGATIVE   Leukocytes,Ua LARGE (A) NEGATIVE    Comment: Performed at [facility], [place] [address], [place], [place] [address]  Urinalysis, Microscopic (reflex)     Status: Abnormal   Collection Time: 11/21/18 10:13 AM  Result Value Ref Range   RBC / HPF 11-20 0 - 5 RBC/hpf   WBC, UA >50 0 - 5 WBC/hpf   Bacteria, UA NONE SEEN NONE SEEN   Squamous Epithelial / LPF 6-10 0 - 5   Non Squamous Epithelial PRESENT (A) NONE SEEN   WBC Clumps PRESENT     Comment: Performed at [facility], [place] [address] [place], [place] [address]  I-Stat beta hCG blood, ED     Status: None   Collection Time: 11/21/18 11:15 AM  Result Value Ref Range   I-stat hCG, quantitative <5.0 <5 mIU/mL   Comment 3            Comment:   GEST. AGE      CONC.  (mIU/mL)   <=1 WEEK        5 - 50     2 WEEKS       50 - 500     3 WEEKS       100 - 10,000     4 WEEKS     1,000 - 30,000  FEMALE AND NON-PREGNANT FEMALE:     LESS THAN 5 mIU/mL   POC CBG, ED      Status: Abnormal   Collection Time: 11/21/18 11:49 AM  Result Value Ref Range   Glucose-Capillary 128 (H) 70 - 99 mg/dL   No results found.  Pending Labs Unresulted Labs (From admission, onward)    Start     Ordered   11/28/18 0500  Creatinine, serum  (enoxaparin (LOVENOX)    CrCl >/= 30 ml/min)  Weekly,   R    Comments: while on enoxaparin therapy    11/21/18 1949   11/22/18 [id number]  Basic metabolic panel  Tomorrow morning,   R     11/21/18 1949   11/22/18 0500  Hemoglobin A1c  Tomorrow morning,   R    Comments: To assess prior glycemic control    11/21/18 1949   11/21/18 1812  SARS CORONAVIRUS 2 (TAT 6-24 HRS) Nasopharyngeal Nasopharyngeal Swab  (Asymptomatic/Tier 2 Patients Labs)  Once,   STAT    Question Answer Comment  Is this test for diagnosis or screening Screening   Symptomatic for COVID-19 as defined by CDC No   Hospitalized for COVID-19 No   Admitted to ICU for COVID-19 No   Previously tested for COVID-19 Yes   Resident in a congregate (group) care setting No   Employed in healthcare setting No   Pregnant No      11/21/18 1811          Vitals/Pain Today's Vitals   11/21/18 1830 11/21/18 1900 11/21/18 1930 11/21/18 2000  BP: 102/66 106/65 98/61 105/70  Pulse: (!) 101 (!) 101 100 95  Resp: 17 17 17 15   Temp:      TempSrc:      SpO2: 99% 98% 92% 100%  Weight:      Height:      PainSc:        Isolation Precautions No active isolations  Medications Medications  lisinopril (ZESTRIL) tablet 2.5 mg (has no administration in time range)  metoCLOPramide (REGLAN) tablet 10 mg (has no administration in time range)  albuterol (VENTOLIN HFA) 108 (90 Base) MCG/ACT inhaler 1-2 puff (has no administration in time range)  enoxaparin (LOVENOX) injection 40 mg (has no administration in time range)  0.45 % sodium chloride infusion (has no administration in time range)  acetaminophen (TYLENOL) tablet 650 mg (has no administration in time range)    Or  acetaminophen  (TYLENOL) suppository 650 mg (has no administration in time range)  insulin aspart (novoLOG) injection 0-15 Units (has no administration in time range)  promethazine (PHENERGAN) injection 12.5 mg (has no administration in time range)  ondansetron (ZOFRAN) injection 4 mg (has no administration in time range)  ketorolac (TORADOL) 15 MG/ML injection 15 mg (has no administration in time range)  ciprofloxacin (CIPRO) IVPB 200 mg (has no administration in time range)  sodium chloride flush (NS) 0.9 % injection 3 mL (3 mLs Intravenous Given 11/21/18 1140)  sodium chloride 0.9 % bolus 500 mL (0 mLs Intravenous Stopped 11/21/18 1356)  promethazine (PHENERGAN) injection 12.5 mg (12.5 mg Intravenous Given 11/21/18 1139)  morphine 2 MG/ML injection 2 mg (2 mg Intravenous Given 11/21/18 1139)  HYDROmorphone (DILAUDID) injection 0.5 mg (0.5 mg Intravenous Given 11/21/18 1356)  ondansetron (ZOFRAN) injection 4 mg (4 mg Intravenous Given 11/21/18 1356)  sodium chloride 0.9 % bolus 1,000 mL (0 mLs Intravenous Stopped 11/21/18 1733)  haloperidol lactate (HALDOL) injection 5 mg (5 mg Intravenous Given 11/21/18 1707)  Mobility walks Low fall risk   Focused Assessments    R Recommendations: See Admitting Provider Note  Report given to:   Additional Notes:

## 2018-11-21 NOTE — ED Provider Notes (Signed)
28yo female with history of gastroparesis, needs PO challenge and vitals recheck.  Physical Exam  BP 101/70   Pulse (!) 103   Temp 98.2 F (36.8 C) (Oral)   Resp 17   Ht 5\' 3"  (1.6 m)   Wt 45 kg   LMP 11/08/2018   SpO2 100%   BMI 17.57 kg/m   Physical Exam Patient rocking in bed, asking for medicine for nausea and pain. Abdomen is non tender. ED Course/Procedures     Procedures  MDM  Patient has been given Phenergan, Zofran, morphine, Dilaudid without improvement in her symptoms.  Patient mains tachycardic and continues to have vomiting.  Plan is to try a dose of Haldol, if patient continues to vomit plan is to admit for intractable vomiting.  Plan of care discussed with patient's nurse. Patient continues to vomit after Haldol.  Patient discussed with Dr. Tomi Bamberger, ER attending, plan is to consult for admission for intractable vomiting. Case discussed with hospitalist, Dr. Linda Hedges, who will consult for admission.       Tacy Learn, PA-C 11/21/18 1831    Dorie Rank, MD 11/22/18 [phone]

## 2018-11-21 NOTE — ED Provider Notes (Signed)
[facility] DEPT Provider Note   CSN: [id number] Arrival date & time: 11/21/18  7711     History   Chief Complaint Chief Complaint  Patient presents with   Abdominal Pain    HPI [name] is a 28 y.o. female history of diabetes, gastroparesis, hypertension, GERD, pancreatitis, liver mass.  Patient presents today for "gastroparesis" she reports that she developed sudden onset abdominal pain, nausea and vomiting at 2 AM this morning.  She reports history of diabetic gastroparesis and reports this feels the same.  She reports abdominal pain as a severe aching constant worsened with movement palpation and without alleviating factors no radiation of pain.  Patient was here on 11/20/18 for similar symptoms.  Denies fever/chills, headache/vision changes, chest pain/shortness of breath, neck pain, hematemesis, bilious emesis, diarrhea, blood in the stool, dysuria/hematuria, vaginal bleeding/discharge, fall/injury or any additional concerns.  She denies any drug or alcohol use including THC.     HPI  Past Medical History:  Diagnosis Date   Anxiety    Arthritis    "hands, feet, knees" (12/18/2016)   Asthma    Diabetic gastroparesis ([place])    Per gastric emptying study 07/09/16 which showed significant delayed gastric emptying.   Gallstones    Gastroparesis    GERD (gastroesophageal reflux disease)    Heart murmur    Hepatic steatosis 11/26/2014   and hepatomegaly   Hypertension    hx (12/18/2016)   Intractable cyclical vomiting syndrome    /notes 12/18/2016   Liver mass 11/26/2014   Pancreatitis, acute 11/26/2014   Pneumonia    "as a teen X 1" (12/18/2016)   Type I diabetes mellitus ([place]) 2007   IDDM.  poorly controlled, multiple admits with DKA    Patient Active Problem List   Diagnosis Date Noted   Diabetes mellitus ([facility]) 11/13/2018   HTN (hypertension) 11/13/2018   Marijuana abuse 11/13/2018   Acute pyelonephritis  10/07/2018   Hydronephrosis of right kidney 10/05/2018   Diffuse pain    Elevated blood pressure reading 12/03/2017   Tachycardia 11/30/2017   Hypernatremia 11/02/2017   Nausea and vomiting 01/22/2017   Nausea & vomiting 01/21/2017   Intractable cyclical vomiting syndrome 12/18/2016   Leukocytosis 10/24/2016   N&V (nausea and vomiting) 10/23/2016   DKA, type 1 ([place]) 09/26/2016   Thrombocytosis ([place]) 07/08/2016   Candiduria, asymptomatic 06/23/2016   Hyponatremia 06/13/2016   Hematuria 05/13/2016   UTI (urinary tract infection) 01/22/2016   Diarrhea 11/09/2015   Acute urinary retention    Gastroparesis due to DM ([place]) 07/10/2015   GERD (gastroesophageal reflux disease) 07/10/2015   Depression with anxiety 07/10/2015   Gastroparesis 06/22/2015   Altered mental status 06/22/2015   Volume depletion 06/10/2015   Protein-calorie malnutrition, severe 06/10/2015   Hyperglycemia    Elevated bilirubin    Hematemesis with nausea    Intractable nausea and vomiting 05/28/2015   Abdominal pain in female    Abdominal pain 05/24/2015   Hypertension 05/24/2015   Dehydration    Diabetic gastroparesis ([facility])    Chronic diastolic heart failure ([place]) 04/11/2015   Hematemesis 04/08/2015   DKA (diabetic ketoacidoses) ([place]) 03/22/2015   S/P laparoscopic cholecystectomy 02/11/2015   Postextubation stridor    Pancreatitis, acute 11/26/2014   Volume overload 11/26/2014   Hypokalemia 11/26/2014   Hepatic steatosis 11/26/2014   Liver mass 11/26/2014   Sepsis ([place]) 11/25/2014   Sinus tachycardia 11/25/2014   Hypomagnesemia 11/25/2014   Hypophosphatemia 11/25/2014   Elevated LFTs 11/24/2014  AKI (acute kidney injury) ([place]) 11/24/2014   Migraine headache 11/24/2014   Asthma 06/29/2012   Uncontrolled type 1 diabetes mellitus ([place]) 06/19/2010   Goiter, unspecified 06/19/2010    Past Surgical History:  Procedure Laterality Date    CHOLECYSTECTOMY N/A 02/11/2015   Procedure: LAPAROSCOPIC CHOLECYSTECTOMY WITH INTRAOPERATIVE CHOLANGIOGRAM;  Surgeon: Greer Pickerel, MD;  Location: [facility];  Service: General;  Laterality: N/A;   ESOPHAGOGASTRODUODENOSCOPY (EGD) WITH PROPOFOL Left 09/20/2014   Procedure: ESOPHAGOGASTRODUODENOSCOPY (EGD) WITH PROPOFOL;  Surgeon: Arta Silence, MD;  Location: [facility];  Service: Endoscopy;  Laterality: Left;   WISDOM TOOTH EXTRACTION       OB History    Gravida  2   Para  1   Term  0   Preterm  1   AB  1   Living  1     SAB  0   TAB  1   Ectopic  0   Multiple  0   Live Births  1            Home Medications    Prior to Admission medications   Medication Sig Start Date End Date Taking? Authorizing Provider  albuterol (PROVENTIL HFA;VENTOLIN HFA) 108 (90 Base) MCG/ACT inhaler Inhale 1-2 puffs into the lungs every 6 (six) hours as needed for wheezing or shortness of breath.    [provider]  blood glucose meter kit and supplies Dispense based on patient and insurance preference. Use up to four times daily as directed. (FOR ICD-10 E10.9, E11.9). 12/07/17   Eugenie Filler, MD  glucagon (GLUCAGON EMERGENCY) 1 MG injection Inject 1 mg into the muscle once as needed (For very low blood sugars. May repeat in 15-20 minutes if needed.). 12/20/16   Hongalgi, Lenis Dickinson, MD  Insulin Human (INSULIN PUMP) SOLN Inject 1 each into the skin continuous. Novolog insulin. Pt puts in her insulin pump: Medtronic 630G. Carb ratio 1-10, insulin sensitive 66. 1.10 from 12 to 12. Every 3 days needs to be replaced.    [provider]  Insulin Pen Needle 31G X 5 MM MISC 30 Units by Does not apply route at bedtime. 12/07/17   Eugenie Filler, MD  lisinopril (ZESTRIL) 2.5 MG tablet Take 2.5 mg by mouth daily.    [provider]  metoCLOPramide (REGLAN) 10 MG tablet Take 1 tablet (10 mg total) by mouth 3 (three) times daily with meals. 11/18/18 11/18/19  Kathie Dike,  MD  ondansetron (ZOFRAN ODT) 4 MG disintegrating tablet Take 1 tablet (4 mg total) by mouth every 8 (eight) hours as needed for nausea or vomiting. 11/18/18   Kathie Dike, MD  promethazine (PHENERGAN) 25 MG suppository Place 1 suppository (25 mg total) rectally every 6 (six) hours as needed for nausea or vomiting. 10/16/18   Arrien, Jimmy Picket, MD  trimethoprim (TRIMPEX) 100 MG tablet Take 100 mg by mouth daily. 11/11/18   [provider]    Family History Family History  Problem Relation Age of Onset   Heart disease Maternal Grandmother    Heart disease Maternal Grandfather    Diabetes Mother    Hyperlipidemia Mother    Hypertension Father    Heart disease Father    Hypertension Paternal Grandmother    Cancer Paternal Grandfather     Social History Social History   Tobacco Use   Smoking status: Never Smoker   Smokeless tobacco: Never Used  Substance Use Topics   Alcohol use: No   Drug use: Yes  Types: Marijuana    Comment: 12/18/2016 q few days"     Allergies   Peanut-containing drug products, Food, and Ultram [tramadol]   Review of Systems Review of Systems Ten systems are reviewed and are negative for acute change except as noted in the HPI   Physical Exam Updated Vital Signs BP (!) 131/97    Pulse (!) 127    Temp 98.2 F (36.8 C) (Oral)    Resp 20    Ht 5' 3"  (1.6 m)    Wt 45 kg    LMP 11/08/2018    SpO2 93%    BMI 17.57 kg/m   Physical Exam Constitutional:      General: She is not in acute distress.    Appearance: Normal appearance. She is well-developed. She is obese. She is not ill-appearing or diaphoretic.     Comments: Uncomfortable appearing  HENT:     Head: Normocephalic and atraumatic.     Right Ear: External ear normal.     Left Ear: External ear normal.     Nose: Nose normal.  Eyes:     General: Vision grossly intact. Gaze aligned appropriately.     Pupils: Pupils are equal, round, and reactive to light.  Neck:       Musculoskeletal: Normal range of motion.     Trachea: Trachea and phonation normal. No tracheal deviation.  Cardiovascular:     Rate and Rhythm: Regular rhythm. Tachycardia present.     Heart sounds: Normal heart sounds.  Pulmonary:     Effort: Pulmonary effort is normal. No respiratory distress.  Abdominal:     General: There is no distension.     Palpations: Abdomen is soft.     Tenderness: There is abdominal tenderness. There is no guarding or rebound.  Musculoskeletal: Normal range of motion.  Skin:    General: Skin is warm and dry.  Neurological:     Mental Status: She is alert.     GCS: GCS eye subscore is 4. GCS verbal subscore is 5. GCS motor subscore is 6.     Comments: Speech is clear and goal oriented, follows commands Major Cranial nerves without deficit, no facial droop Moves extremities without ataxia, coordination intact  Psychiatric:        Behavior: Behavior normal.      ED Treatments / Results  Labs (all labs ordered are listed, but only abnormal results are displayed) Labs Reviewed  COMPREHENSIVE METABOLIC PANEL - Abnormal; Notable for the following components:      Result Value   Glucose, Bld 151 (*)    Total Protein 9.3 (*)    All other components within normal limits  CBC - Abnormal; Notable for the following components:   Platelets 579 (*)    All other components within normal limits  CBG MONITORING, ED - Abnormal; Notable for the following components:   Glucose-Capillary 128 (*)    All other components within normal limits  LIPASE, BLOOD  URINALYSIS, ROUTINE W REFLEX MICROSCOPIC  I-STAT BETA HCG BLOOD, ED ([facility], [facility], [facility] ONLY)    EKG None  Radiology No results found.  Procedures Procedures (including critical care time)  Medications Ordered in ED Medications  sodium chloride flush (NS) 0.9 % injection 3 mL (3 mLs Intravenous Given 11/21/18 1140)  sodium chloride 0.9 % bolus 500 mL (0 mLs Intravenous Stopped 11/21/18 1356)  promethazine  (PHENERGAN) injection 12.5 mg (12.5 mg Intravenous Given 11/21/18 1139)  morphine 2 MG/ML injection 2 mg (2 mg Intravenous  Given 11/21/18 1139)  HYDROmorphone (DILAUDID) injection 0.5 mg (0.5 mg Intravenous Given 11/21/18 1356)  ondansetron (ZOFRAN) injection 4 mg (4 mg Intravenous Given 11/21/18 1356)     Initial Impression / Assessment and Plan / ED Course  I have reviewed the triage vital signs and the nursing notes.  Pertinent labs & imaging results that were available during my care of the patient were reviewed by me and considered in my medical decision making (see chart for details).     Beta-hCG CBG 128 Lipase within normal limit CBC nonacute CMP nonacute, no evidence of DKA Urinalysis pending - Patient reassessed after she received Phenergan and morphine.  She reports that pain and nausea and initially improved however have since returned.  She is requesting additional medication to help with her symptoms today. - 2:54 PM: Patient reassessed sleeping easily arousable to voice.  She reports improvement in pain and nausea following Dilaudid and Zofran.  Abdomen is soft nontender and without peritoneal signs on reassessment.  Will attempt p.o. challenge and continue to give IV fluids for suspected dehydration and reassess. - Care handoff given to Suella Broad, PA-C at shift change, plan of care is to await completion of fluid bolus, p.o. challenge.  Reassess, anticipate discharge.  Disposition per oncoming team.  Note: Portions of this report may have been transcribed using voice recognition software. Every effort was made to ensure accuracy; however, inadvertent computerized transcription errors may still be present. Final Clinical Impressions(s) / ED Diagnoses   Final diagnoses:  None    ED Discharge Orders    None       Gari Crown 11/21/18 1517    Lacretia Leigh, MD 11/22/18 0730

## 2018-11-21 NOTE — ED Notes (Signed)
Ginger ale and crackers offered to patient.

## 2018-11-22 DIAGNOSIS — I110 Hypertensive heart disease with heart failure: Secondary | ICD-10-CM | POA: Diagnosis present

## 2018-11-22 DIAGNOSIS — E1143 Type 2 diabetes mellitus with diabetic autonomic (poly)neuropathy: Secondary | ICD-10-CM | POA: Diagnosis not present

## 2018-11-22 DIAGNOSIS — Z20828 Contact with and (suspected) exposure to other viral communicable diseases: Secondary | ICD-10-CM | POA: Diagnosis present

## 2018-11-22 DIAGNOSIS — M1990 Unspecified osteoarthritis, unspecified site: Secondary | ICD-10-CM | POA: Diagnosis present

## 2018-11-22 DIAGNOSIS — J449 Chronic obstructive pulmonary disease, unspecified: Secondary | ICD-10-CM | POA: Diagnosis present

## 2018-11-22 DIAGNOSIS — Z87440 Personal history of urinary (tract) infections: Secondary | ICD-10-CM | POA: Diagnosis not present

## 2018-11-22 DIAGNOSIS — I5032 Chronic diastolic (congestive) heart failure: Secondary | ICD-10-CM | POA: Diagnosis present

## 2018-11-22 DIAGNOSIS — R112 Nausea with vomiting, unspecified: Secondary | ICD-10-CM | POA: Diagnosis present

## 2018-11-22 DIAGNOSIS — Z91010 Allergy to peanuts: Secondary | ICD-10-CM | POA: Diagnosis not present

## 2018-11-22 DIAGNOSIS — Z79899 Other long term (current) drug therapy: Secondary | ICD-10-CM | POA: Diagnosis not present

## 2018-11-22 DIAGNOSIS — Z9641 Presence of insulin pump (external) (internal): Secondary | ICD-10-CM | POA: Diagnosis present

## 2018-11-22 DIAGNOSIS — Z833 Family history of diabetes mellitus: Secondary | ICD-10-CM | POA: Diagnosis not present

## 2018-11-22 DIAGNOSIS — K219 Gastro-esophageal reflux disease without esophagitis: Secondary | ICD-10-CM | POA: Diagnosis present

## 2018-11-22 DIAGNOSIS — Z91018 Allergy to other foods: Secondary | ICD-10-CM | POA: Diagnosis not present

## 2018-11-22 DIAGNOSIS — Z8249 Family history of ischemic heart disease and other diseases of the circulatory system: Secondary | ICD-10-CM | POA: Diagnosis not present

## 2018-11-22 DIAGNOSIS — Z794 Long term (current) use of insulin: Secondary | ICD-10-CM | POA: Diagnosis not present

## 2018-11-22 DIAGNOSIS — R109 Unspecified abdominal pain: Secondary | ICD-10-CM | POA: Diagnosis not present

## 2018-11-22 DIAGNOSIS — K3184 Gastroparesis: Secondary | ICD-10-CM | POA: Diagnosis present

## 2018-11-22 DIAGNOSIS — N136 Pyonephrosis: Secondary | ICD-10-CM | POA: Diagnosis present

## 2018-11-22 DIAGNOSIS — Z888 Allergy status to other drugs, medicaments and biological substances status: Secondary | ICD-10-CM | POA: Diagnosis not present

## 2018-11-22 DIAGNOSIS — E1043 Type 1 diabetes mellitus with diabetic autonomic (poly)neuropathy: Secondary | ICD-10-CM | POA: Diagnosis not present

## 2018-11-22 LAB — HEMOGLOBIN A1C
Hgb A1c MFr Bld: 7.6 % — ABNORMAL HIGH (ref 4.8–5.6)
Mean Plasma Glucose: 171.42 mg/dL

## 2018-11-22 LAB — GLUCOSE, CAPILLARY
Glucose-Capillary: 239 mg/dL — ABNORMAL HIGH (ref 70–99)
Glucose-Capillary: 167 mg/dL — ABNORMAL HIGH (ref 70–99)
Glucose-Capillary: 97 mg/dL (ref 70–99)
Glucose-Capillary: 228 mg/dL — ABNORMAL HIGH (ref 70–99)

## 2018-11-22 LAB — BASIC METABOLIC PANEL
Anion gap: 10 (ref 5–15)
BUN: 7 mg/dL (ref 6–20)
CO2: 25 mmol/L (ref 22–32)
Calcium: 8.8 mg/dL — ABNORMAL LOW (ref 8.9–10.3)
Chloride: 103 mmol/L (ref 98–111)
Creatinine, Ser: 0.71 mg/dL (ref 0.44–1.00)
GFR calc Af Amer: >60 mL/min (ref >60)
GFR calc non Af Amer: >60 mL/min (ref >60)
Glucose, Bld: 204 mg/dL — ABNORMAL HIGH (ref 70–99)
Potassium: 3.8 mmol/L (ref 3.5–5.1)
Sodium: 138 mmol/L (ref 135–145)

## 2018-11-22 LAB — SARS CORONAVIRUS 2 (TAT 6-24 HRS): SARS Coronavirus 2: NEGATIVE

## 2018-11-22 MED ORDER — INSULIN GLARGINE 100 UNIT/ML ~~LOC~~ SOLN
20.0000 [IU] | Freq: Every day | SUBCUTANEOUS | Status: DC
  Administered 2018-11-22: 10:00:00 20 [IU] via SUBCUTANEOUS
  Filled 2018-11-22 (×2): qty 0.2

## 2018-11-22 NOTE — Progress Notes (Signed)
HR = 118; BP= 144/97. Patient complained about pain at 7 (0-10) at abdomen upper. Pain medication was given. History of DM1, gastroparesis. Will monitor patient as yellow MEWS protocol.

## 2018-11-22 NOTE — Progress Notes (Signed)
   11/22/18 0523  Vitals  Temp 98.4 F (36.9 C)  BP (!) 147/106  MAP (mmHg) 113  BP Location Left Arm  BP Method Automatic  Patient Position (if appropriate) Sitting  Pulse Rate (!) 115  Pulse Rate Source Monitor  Resp 16  Oxygen Therapy  SpO2 100 %  O2 Device Room Air  MEWS Score  MEWS RR 0  MEWS Pulse 2  MEWS Systolic 0  MEWS LOC 0  MEWS Temp 0  MEWS Score 2  MEWS Score Color Yellow  mews initiated

## 2018-11-22 NOTE — Progress Notes (Addendum)
Inpatient Diabetes Program Recommendations  AACE/ADA: New Consensus Statement on Inpatient Glycemic Control (2015)  Target Ranges:  Prepandial:   less than 140 mg/dL      Peak postprandial:   less than 180 mg/dL (1-2 hours)      Critically ill patients:  140 - 180 mg/dL   Results for [name], [name] (MRN [id number]) as of 11/22/2018 08:17  Ref. Range 11/21/2018 11:49 11/21/2018 21:30 11/22/2018 07:40  Glucose-Capillary Latest Ref Range: 70 - 99 mg/dL 128 (H) 101 (H) 239 (H)  5 units NOVOLOG     Admit with: Gastroparesis  History: Type 1 Diabetes, Gastroparesis  Home DM Meds: Insulin Pump  Current Orders: Novolog Moderate Correction Scale/ SSI (0-15 units) TID AC    MD- Per RN this AM, patient has turned her Insulin Pump off.  Will need Basal insulin while pump off to prevent DKA since patient with Type 1 diabetes.  Please consider placing orders for Lantus 20 units Daily (start this AM)  This would be 70% of her total basal she gets on her pump in a 24 hour period.    Admit 10/25 through 10/30 for intractable N/V.  Endocrinologist: D. Francetta Found with [facility]--Last seen January 2020 Insulin Pump Settings Basal Rate: 1.4 units/hr Total Basal per 24 hours= 33.6 Carbohydrate Ratio= 1 unit for every 10 grams Carbs Correction Factor= 1 unit for every 66 mg/dl above Target CBG    --Will follow patient during hospitalization--  Wyn Quaker RN, MSN, CDE Diabetes Coordinator Inpatient Glycemic Control Team Team Pager: [phone] (8a-5p)

## 2018-11-22 NOTE — Progress Notes (Signed)
PROGRESS NOTE  [name] [id number] DOB: 1990/12/16 DOA: 11/21/2018 PCP: Vicenta Aly, [place] Hospital Course/Subjective: Unfortunate 28yo female with Type I DM and severe gastroparesis who has had mulitple hospital visits for gastroparesis and is again admitted with abdominal pain, vomiting. She has been maintained on alternating IV Phenergan and IV Zofran but says she is still vomiting and unable to keep liquids down. She is getting IV toradol for abdominal pain and requesting IV morphine.  Assessment/Plan: 1.  Diabetic gastroparesis -this is a chronic problem for this patient.  She has been very difficult to control spite multiple outpatient attempts at multidrug regimens.  She has been seen by GI who has no significant additions to her current regimen.  She has had numerous hospitalizations.  By her report she is adherent to her medical regimen.             IV fluids for rehydration             IV Phenergan every 4 hours alternating with IV Zofran every 4 hours, continue p.o. Reglan 10 mg 3              times daily              2.  Abdominal pain -directly related to her gastroparesis.  Acute abdominal symptoms.  Will continue to avoid narcotics, and explained this to patient this AM. Plan     ketorolac 15 mg IV every 6 as needed  3. Insulin-dependent diabetes -last hemoglobin A1c greater than 90 days ago 8.7%.  She reports she does continue to use her insulin pump denies having any problems with this recently.  He does follow with Dr. Zigmund [name] for endocrinology last visit in January 2020 Plan     off insulin pump while inpatient             Lantus 20 units every 24 hours             Adding scale coverage while inpatient  4.  Chronic diastolic heart failure -patient is asymptomatic.  Examination is unremarkable.  2D echo November 08, 2018 revealed a left ventricular ejection fraction of 60 to 65%, mild tricuspid valve regurgitation and otherwise normal.  No evidence of  impaired relaxation phase  5. UTI - positive U/A Plan     Cipro 200 mg IV q 12 x 6 doses - convert to po if discharged before completion.  DVT prophylaxis: Lovenox  Code Status: FULL  Objective: Vitals:   11/21/18 2100 11/21/18 2128 11/22/18 0523 11/22/18 0744  BP: (!) 129/102 118/84 (!) 147/106 (!) 144/97  Pulse: 98 (!) 104 (!) 115 (!) 118  Resp: (!) 22 16 16 16   Temp:  98.7 F (37.1 C) 98.4 F (36.9 C) 99.2 F (37.3 C)  TempSrc:  Oral    SpO2: 99% 98% 100% 100%  Weight:      Height:        Intake/Output Summary (Last 24 hours) at 11/22/2018 [id number] Last data filed at 11/22/2018 0800 Gross per 24 hour  Intake 566.75 ml  Output --  Net 566.75 ml   Filed Weights   11/21/18 1009  Weight: 45 kg     Exam: General:  Alert, oriented, calm, looks tired but not in acute pain Eyes: EOMI, clear sclerea Neck: supple, no masses, trachea mildline  Cardiovascular: RRR, no murmurs or rubs, no peripheral edema  Respiratory: clear to auscultation bilaterally, no wheezes, no crackles  Abdomen: soft, tender, nondistended, normal bowel  tones heard  Skin: dry, no rashes  Musculoskeletal: no joint effusions, normal range of motion  Psychiatric: appropriate affect, normal speech  Neurologic: extraocular muscles intact, clear speech, moving all extremities with intact sensorium    Data Reviewed: CBC: Recent Labs  Lab 11/20/18 0001 11/21/18 1013  WBC 11.4* 8.5  HGB 10.8* 13.2  HCT 34.4* 41.3  MCV 90.5 90.2  PLT 478* [id number]*   Basic Metabolic Panel: Recent Labs  Lab 11/17/18 0506 11/18/18 0330 11/20/18 0001 11/21/18 1013 11/22/18 0531  NA 133* 133* 141 139 138  K 3.5 4.1 3.3* 3.7 3.8  CL 107 102 103 98 103  CO2 21* 25 26 27 25   GLUCOSE 48* 310* 116* 151* 204*  BUN 6 8 10 8 7   CREATININE 0.72 0.69 0.67 0.72 0.71  CALCIUM 8.4* 8.4* 9.5 9.8 8.8*   GFR: Estimated Creatinine Clearance: 73.7 mL/min (by C-G formula based on SCr of 0.71 mg/dL). Liver Function Tests: Recent  Labs  Lab 11/20/18 0001 11/21/18 1013  AST 13* 16  ALT 11 11  ALKPHOS 88 109  BILITOT 0.5 1.0  PROT 7.6 9.3*  ALBUMIN 3.7 4.7   Recent Labs  Lab 11/20/18 0001 11/21/18 1013  LIPASE 16 17   No results for input(s): AMMONIA in the last 168 hours. Coagulation Profile: No results for input(s): INR, PROTIME in the last 168 hours. Cardiac Enzymes: No results for input(s): CKTOTAL, CKMB, CKMBINDEX, TROPONINI in the last 168 hours. BNP (last 3 results) No results for input(s): PROBNP in the last 8760 hours. HbA1C: No results for input(s): HGBA1C in the last 72 hours. CBG: Recent Labs  Lab 11/18/18 1200 11/18/18 1648 11/21/18 1149 11/21/18 2130 11/22/18 0740  GLUCAP 143* 220* 128* 101* 239*   Lipid Profile: No results for input(s): CHOL, HDL, LDLCALC, TRIG, CHOLHDL, LDLDIRECT in the last 72 hours. Thyroid Function Tests: No results for input(s): TSH, T4TOTAL, FREET4, T3FREE, THYROIDAB in the last 72 hours. Anemia Panel: No results for input(s): VITAMINB12, FOLATE, FERRITIN, TIBC, IRON, RETICCTPCT in the last 72 hours. Urine analysis:    Component Value Date/Time   COLORURINE YELLOW 11/21/2018 1013   APPEARANCEUR TURBID (A) 11/21/2018 1013   LABSPEC 1.020 11/21/2018 1013   PHURINE 8.5 (H) 11/21/2018 1013   GLUCOSEU NEGATIVE 11/21/2018 1013   GLUCOSEU >=1000 11/07/2012 1205   HGBUR MODERATE (A) 11/21/2018 1013   BILIRUBINUR NEGATIVE 11/21/2018 1013   KETONESUR 15 (A) 11/21/2018 1013   PROTEINUR 30 (A) 11/21/2018 1013   UROBILINOGEN 0.2 11/24/2014 1045   NITRITE NEGATIVE 11/21/2018 1013   LEUKOCYTESUR LARGE (A) 11/21/2018 1013   Sepsis Labs: @LABRCNTIP (procalcitonin:4,lacticidven:4)  ) Recent Results (from the past 240 hour(s))  SARS CORONAVIRUS 2 (TAT 6-24 HRS) Nasopharyngeal Nasopharyngeal Swab     Status: None   Collection Time: 11/13/18  4:59 PM   Specimen: Nasopharyngeal Swab  Result Value Ref Range Status   SARS Coronavirus 2 NEGATIVE NEGATIVE Final     Comment: (NOTE) SARS-CoV-2 target nucleic acids are NOT DETECTED. The SARS-CoV-2 RNA is generally detectable in upper and lower respiratory specimens during the acute phase of infection. Negative results do not preclude SARS-CoV-2 infection, do not rule out co-infections with other pathogens, and should not be used as the sole basis for treatment or other patient management decisions. Negative results must be combined with clinical observations, patient history, and epidemiological information. The expected result is Negative. Fact Sheet for Patients: SugarRoll.be Fact Sheet for Healthcare Providers: https://www.woods-mathews.com/ This test is not yet approved or cleared by  the [employer] and  has been authorized for detection and/or diagnosis of SARS-CoV-2 by FDA under an Emergency Use Authorization (EUA). This EUA will remain  in effect (meaning this test can be used) for the duration of the COVID-19 declaration under Section 56 4(b)(1) of the Act, 21 U.S.C. section 360bbb-3(b)(1), unless the authorization is terminated or revoked sooner. Performed at [facility], [place] [address] [place], [place] [address]      Studies: No results found.  Scheduled Meds:  enoxaparin (LOVENOX) injection  40 mg Subcutaneous QHS   insulin aspart  0-15 Units Subcutaneous TID WC   lisinopril  2.5 mg Oral Daily   metoCLOPramide  10 mg Oral TID WC   ondansetron (ZOFRAN) IV  4 mg Intravenous Q4H   promethazine  12.5 mg Intravenous Q4H    Continuous Infusions:  ciprofloxacin Stopped (11/22/18 0000)     LOS: 0 days   Time spent: 26 minutes  Shavanna Furnari Marry Guan, MD [facility] Pager [phone]  If 7PM-7AM, please contact night-coverage [email or web address] Password [email or web address] 11/22/2018, 8:29 AM

## 2018-11-23 DIAGNOSIS — E1143 Type 2 diabetes mellitus with diabetic autonomic (poly)neuropathy: Secondary | ICD-10-CM | POA: Diagnosis not present

## 2018-11-23 DIAGNOSIS — K3184 Gastroparesis: Secondary | ICD-10-CM | POA: Diagnosis not present

## 2018-11-23 LAB — GLUCOSE, CAPILLARY
Glucose-Capillary: 84 mg/dL (ref 70–99)
Glucose-Capillary: 24 mg/dL — CL (ref 70–99)
Glucose-Capillary: 61 mg/dL — ABNORMAL LOW (ref 70–99)
Glucose-Capillary: 205 mg/dL — ABNORMAL HIGH (ref 70–99)

## 2018-11-23 MED ORDER — SODIUM CHLORIDE 0.9 % IV SOLN
INTRAVENOUS | Status: DC | PRN
  Administered 2018-11-23: 04:00:00 250 mL via INTRAVENOUS

## 2018-11-23 NOTE — Discharge Instructions (Signed)
Return to Work _______________________[name]____________________________ was treated at our facility. Injury or illness was: ___Work-related. _x__Not work-related. ___Undetermined if work-related. Return to work  Employee may return to work on __11/06/2020____________________.  Employee may return to modified work on ______________________. Work activity restrictions This person is not able to do the following activities: ___Bend ___Sit for a prolonged time  This person should not sit for more than ____ hours at a time.  This person should not sit for more than ____ hours during an 8-hour workday. ___Lift more than ________ lb ___Squat ___ Stand for a prolonged time  ___ This person should not stand for more than ____ hours at a time.  ___ This person should not stand for more than ____ hours during an 8-hour workday. ___Climb ___Reach ___Push and pull with the ___ right hand ___ left hand ___ Walk  ___ This person should not walk for more than ____ hours at a time.  ___ This person should not walk for more than ____ hours during an 8-hour workday. ___ Drive or operate a motor vehicle at work ___ [employer] with the ___ right hand ___ left hand _x_Other ____no restrictions_____________________________________________________________ These restrictions are effective until ______n/a________________ or until a recheck appointment on ______________________. Health care provider name (printed): _____Deborah Simeon Craft, rn____________________________________ Health care provider (signature): _________________________________________ Date: ___________________11/04/2020_____________________ How to use this form Show this Return to Work statement to your supervisor at work as soon as possible. Your employer should be aware of your condition and may be able to help with the necessary work activity restrictions. Contact your health care provider if:  You wish to return to work  sooner than the date that is listed above.  You have problems that make it difficult for you to return at that time. This information is not intended to replace advice given to you by your health care provider. Make sure you discuss any questions you have with your health care provider. Document Released: 01/05/2005 Document Revised: 12/31/2016 Document Reviewed: 12/31/2016 Elsevier Patient Education  2020 [employer].

## 2018-11-23 NOTE — Discharge Summary (Signed)
Discharge Summary  [name] JOI:[id number] DOB: [birth year]  PCP: Vicenta Aly, FNP  Admit date: 11/21/2018 Discharge date: 11/23/2018   Recommendations for Outpatient Follow-up:  1. PCP 2 weeks 2. 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    Discharge Condition: Stable   Diet recommendation: Diabetic, multiple small meals.  Vitals:   11/23/18 0017 11/23/18 0540  BP: (!) 138/93 114/79  Pulse: (!) 113 (!) 101  Resp: 16 16  Temp: 98.4 F (36.9 C) 98.6 F (37 C)  SpO2: 100% 99%    HPI and Brief Hospital Course:  This is an unfortunate 28 year old female with type 1 diabetes and severe gastroparesis who has had multiple hospital and emergency room visits for gastroparesis and is again admitted with abdominal pain, nausea and vomiting.  Since admission to the hospital, she has been maintained on alternating IV Phenergan and IV Zofran, as well as IV fluids.  She has not vomited now for nearly 24 hours, she has been able to keep liquids down now, she says.  She is also getting IV Toradol for abdominal pain.  This morning, the patient says that she is feeling little bit better, she is agreeable to try and discharge home from the hospital today.  As such, we will see if we can get the patient home from the hospital today.  Due to her severe gastroparesis, she is certainly high risk for repeat ER visits and hospital admission.  She was not given any narcotics since hospital admission.  Discharge details, plan of care and follow up instructions were discussed with patient and any available family or care providers. Patient  and family are in agreement with discharge from the hospital today and all questions were answered to their satisfaction.  Discharge Exam: BP 114/79 (BP Location: Right Arm)    Pulse (!) 101    Temp 98.6 F (37 C) (Oral)    Resp 16    Ht 5' 3" (1.6 m)    Wt 45 kg    LMP 11/08/2018    SpO2 99%    BMI 17.57 kg/m  General:  Alert, oriented, calm, in no acute distress  Eyes: EOMI, clear sclerea Neck: supple, no masses, trachea mildline  Cardiovascular: RRR, no murmurs or rubs, no peripheral edema  Respiratory: clear to auscultation bilaterally, no wheezes, no crackles  Abdomen: soft, nontender, nondistended, normal bowel tones heard  Skin: dry, no rashes  Musculoskeletal: no joint effusions, normal range of motion  Psychiatric: appropriate affect, normal speech  Neurologic: extraocular muscles intact, clear speech, moving all extremities with intact sensorium    Discharge Instructions You were cared for by a hospitalist during your hospital stay. If you have any questions about your discharge medications or the care you received while you were in the hospital after you are discharged, you can call the unit and asked to speak with the hospitalist on call if the hospitalist that took care of you is not available. Once you are discharged, your primary care physician will handle any further medical issues. Please note that NO REFILLS for any discharge medications will be authorized once you are discharged, as it is imperative  that you return to your primary care physician (or establish a relationship with a primary care physician if you do not have one) for your aftercare needs so that they can reassess your need for medications and monitor your lab values.  Discharge Instructions    Diet - low sodium heart healthy   Complete by: As directed    Increase activity slowly   Complete by: As directed      Allergies as of 11/23/2018      Reactions   Peanut-containing Drug Products Swelling, Other (See  Comments)   Reaction:  Swelling of mouth and lips    Food Swelling, Other (See Comments)   Pt is allergic to strawberries.   Reaction:  Swelling of mouth and lips    Ultram [tramadol] Itching      Medication List    STOP taking these medications   promethazine 25 MG suppository Commonly known as: PHENERGAN     TAKE these medications   albuterol 108 (90 Base) MCG/ACT inhaler Commonly known as: VENTOLIN HFA Inhale 1-2 puffs into the lungs every 6 (six) hours as needed for wheezing or shortness of breath.   blood glucose meter kit and supplies Dispense based on patient and insurance preference. Use up to four times daily as directed. (FOR ICD-10 E10.9, E11.9).   glucagon 1 MG injection Commonly known as: Glucagon Emergency Inject 1 mg into the muscle once as needed (For very low blood sugars. May repeat in 15-20 minutes if needed.).   Insulin Pen Needle 31G X 5 MM Misc 30 Units by Does not apply route at bedtime.   insulin pump Soln Inject 1 each into the skin continuous. Novolog insulin. Pt puts in her insulin pump: Medtronic 630G. Carb ratio 1-10, insulin sensitive 66. 1.10 from 12 to 12. Every 3 days needs to be replaced.   lisinopril 2.5 MG tablet Commonly known as: ZESTRIL Take 2.5 mg by mouth daily.   metoCLOPramide 10 MG tablet Commonly known as: REGLAN Take 1 tablet (10 mg total) by mouth 3 (three) times daily with meals.   ondansetron 4 MG disintegrating tablet Commonly known as: Zofran ODT Take 1 tablet (4 mg total) by mouth every 8 (eight) hours as needed for nausea or vomiting.   trimethoprim 100 MG tablet Commonly known as: TRIMPEX Take 100 mg by mouth daily.      Allergies  Allergen Reactions   Peanut-Containing Drug Products Swelling and Other (See Comments)    Reaction:  Swelling of mouth and lips    Food Swelling and Other (See Comments)    Pt is allergic to strawberries.   Reaction:  Swelling of mouth and lips    Ultram [Tramadol] Itching       The results of significant diagnostics from this hospitalization (including imaging, microbiology, ancillary and laboratory) are listed below for reference.    Significant Diagnostic Studies: US Abdomen Complete  Result Date: 11/04/2018 CLINICAL DATA:  Nausea and vomiting. EXAM: ABDOMEN ULTRASOUND COMPLETE COMPARISON:  CT 10/04/2018 FINDINGS: Gallbladder: Surgically absent. Common bile duct: Diameter: 3 mm. Liver: Subtle liver lesions on prior CT are not defined sonographically. Within normal limits in parenchymal echogenicity. Portal vein is patent on color Doppler imaging with normal direction of blood flow towards the liver. IVC: No abnormality visualized. Pancreas: Visualized portion unremarkable. Spleen: Size and appearance within normal limits. Right Kidney: Length: 10.5 cm. Hydronephrosis on prior exam has resolved. Echogenicity within normal limits. Left Kidney: Length: 10.5 cm. Echogenicity within normal limits. No mass or  hydronephrosis visualized. Abdominal aorta: No aneurysm visualized. Other findings: None. IMPRESSION: 1. Post cholecystectomy without biliary dilatation. 2. Resolved right hydronephrosis from CT last month. 3. Liver lesions on prior CT are not defined sonographically. Electronically Signed   By: Keith Rake M.D.   On: 11/04/2018 21:33   US Renal  Result Date: 11/07/2018 CLINICAL DATA:  Urinary retention, RIGHT-side pain, history type I diabetes mellitus, hypertension EXAM: RENAL / URINARY TRACT ULTRASOUND COMPLETE COMPARISON:  Renal ultrasound 10/06/2018 FINDINGS: Right Kidney: Renal measurements: 11.7 x 5.1 x 5.0 cm = volume: 156 mL. Normal cortical thickness. Increased cortical echogenicity. Mild RIGHT hydronephrosis. Mild thickening of the wall of the renal pelvis, 3 mm thick. No renal mass or shadowing calcification. Left Kidney: Renal measurements: 10.0 x 5.3 x 4.4 cm = volume: 125 mL. Normal cortical thickness. Increased cortical echogenicity. No mass,  hydronephrosis, or shadowing calcification. Bladder: Bladder wall appears diffusely thickened and irregular. Scattered debris within bladder. No focal bladder mass. Other: N/A IMPRESSION: Diffuse bladder wall thickening and irregularity, associated with significant debris within bladder, question cystitis; recommend correlation with urinalysis. Mild RIGHT hydronephrosis with thickening of the wall of the RIGHT renal pelvis, can be seen with pyelonephritis/chronic infection. Medical renal disease changes of both kidneys, suspect related to history of type I diabetes mellitus. Electronically Signed   By: Lavonia Dana M.D.   On: 11/07/2018 18:09   Dg Chest Portable 2 Views  Result Date: 11/14/2018 CLINICAL DATA:  Status post central line placement EXAM: PORTABLE CHEST 1 VIEW COMPARISON:  07/15/2016 FINDINGS: Cardiac shadows within normal limits. Right jugular central line is noted with catheter tip in the mid superior vena cava. No pneumothorax is seen. No focal infiltrate is noted. IMPRESSION: Noted thorax following central line placement is Electronically Signed   By: Inez Catalina M.D.   On: 11/14/2018 00:18   Dg Chest Port 1 View  Result Date: 11/14/2018 CLINICAL DATA:  Order for encounter for central line. EXAM: PORTABLE CHEST 1 VIEW COMPARISON:  Chest radiograph 11/14/2018 FINDINGS: Stable right central venous catheter with tip projecting over the mid SVC. Normal cardiomediastinal contours. The lungs are clear. No pneumothorax or large pleural effusion. No acute finding in the visualized skeleton. IMPRESSION: Stable appearance of a right central venous catheter. Electronically Signed   By: Audie Pinto M.D.   On: 11/14/2018 09:48   Vas [place] Upper Extremity Venous Duplex  Result Date: 11/15/2018 UPPER VENOUS STUDY  Indications: Pain, and Swelling Risk Factors: None identified. Limitations: Poor ultrasound/tissue interface. Comparison Study: No prior studies. Performing Technologist: Oliver Hum  RVT  Examination Guidelines: A complete evaluation includes B-mode imaging, spectral Doppler, color Doppler, and power Doppler as needed of all accessible portions of each vessel. Bilateral testing is considered an integral part of a complete examination. Limited examinations for reoccurring indications may be performed as noted.  Right Findings: +----------+------------+---------+-----------+----------+-------+  RIGHT      Compressible Phasicity Spontaneous Properties Summary  +----------+------------+---------+-----------+----------+-------+  Subclavian     Full        Yes        Yes                         +----------+------------+---------+-----------+----------+-------+  Left Findings: +----------+------------+---------+-----------+----------+-----------------+  LEFT       Compressible Phasicity Spontaneous Properties      Summary       +----------+------------+---------+-----------+----------+-----------------+  IJV            Full  Yes        Yes                                   +----------+------------+---------+-----------+----------+-----------------+  Subclavian     Full        Yes        Yes                                   +----------+------------+---------+-----------+----------+-----------------+  Axillary       Full        Yes        Yes                                   +----------+------------+---------+-----------+----------+-----------------+  Brachial       Full        Yes        Yes                                   +----------+------------+---------+-----------+----------+-----------------+  Radial         Full                                                         +----------+------------+---------+-----------+----------+-----------------+  Ulnar          Full                                                         +----------+------------+---------+-----------+----------+-----------------+  Cephalic       None                                      Age Indeterminate   +----------+------------+---------+-----------+----------+-----------------+  Basilic        Full                                                         +----------+------------+---------+-----------+----------+-----------------+  Summary:  Right: No evidence of thrombosis in the subclavian.  Left: No evidence of deep vein thrombosis in the upper extremity. Findings consistent with age indeterminate superficial vein thrombosis involving the left cephalic vein.  *See table(s) above for measurements and observations.  Diagnosing physician: Deitra Mayo MD Electronically signed by Deitra Mayo MD on 11/15/2018 at 8:56:14 AM.    Final    [place] Ekg Site Rite  Result Date: 11/07/2018 If Site Rite image not attached, placement could not be confirmed due to current cardiac rhythm.   Microbiology: Recent Results (from the past 240 hour(s))  SARS CORONAVIRUS 2 (TAT 6-24 HRS) Nasopharyngeal Nasopharyngeal Swab     Status: None   Collection Time: 11/13/18  4:59 PM   Specimen: Nasopharyngeal Swab  Result Value Ref Range Status   SARS Coronavirus 2 NEGATIVE NEGATIVE Final    Comment: (NOTE) SARS-CoV-2 target nucleic acids are NOT DETECTED. The SARS-CoV-2 RNA is generally detectable in upper and lower respiratory specimens during the acute phase of infection. Negative results do not preclude SARS-CoV-2 infection, do not rule out co-infections with other pathogens, and should not be used as the sole basis for treatment or other patient management decisions. Negative results must be combined with clinical observations, patient history, and epidemiological information. The expected result is Negative. Fact Sheet for Patients: SugarRoll.be Fact Sheet for Healthcare Providers: https://www.woods-mathews.com/ This test is not yet approved or cleared by the [place] FDA and  has been authorized for detection and/or diagnosis of SARS-CoV-2 by FDA under  an Emergency Use Authorization (EUA). This EUA will remain  in effect (meaning this test can be used) for the duration of the COVID-19 declaration under Section 56 4(b)(1) of the Act, 21 U.S.C. section 360bbb-3(b)(1), unless the authorization is terminated or revoked sooner. Performed at [facility], [place] [address] [place], [place] [address]   SARS CORONAVIRUS 2 (TAT 6-24 HRS) Nasopharyngeal Nasopharyngeal Swab     Status: None   Collection Time: 11/21/18  6:40 PM   Specimen: Nasopharyngeal Swab  Result Value Ref Range Status   SARS Coronavirus 2 NEGATIVE NEGATIVE Final    Comment: (NOTE) SARS-CoV-2 target nucleic acids are NOT DETECTED. The SARS-CoV-2 RNA is generally detectable in upper and lower respiratory specimens during the acute phase of infection. Negative results do not preclude SARS-CoV-2 infection, do not rule out co-infections with other pathogens, and should not be used as the sole basis for treatment or other patient management decisions. Negative results must be combined with clinical observations, patient history, and epidemiological information. The expected result is Negative. Fact Sheet for Patients: SugarRoll.be Fact Sheet for Healthcare Providers: https://www.woods-mathews.com/ This test is not yet approved or cleared by the [place] FDA and  has been authorized for detection and/or diagnosis of SARS-CoV-2 by FDA under an Emergency Use Authorization (EUA). This EUA will remain  in effect (meaning this test can be used) for the duration of the COVID-19 declaration under Section 56 4(b)(1) of the Act, 21 U.S.C. section 360bbb-3(b)(1), unless the authorization is terminated or revoked sooner. Performed at [facility], [place] [address], [place], [place] [address]      Labs: Basic Metabolic Panel: Recent Labs  Lab 11/17/18 0506 11/18/18 0330 11/20/18 0001 11/21/18 1013 11/22/18 0531  NA 133*  133* 141 139 138  K 3.5 4.1 3.3* 3.7 3.8  CL 107 102 103 98 103  CO2 21* _0 GLUCOSE 48* 310* 116* 151* 204*  BUN _1 CREATININE 0.72 0.69 0.67 0.72 0.71  CALCIUM 8.4* 8.4* 9.5 9.8 8.8*   Liver Function Tests: Recent Labs  Lab 11/20/18 0001 11/21/18 1013  AST 13* 16  ALT 11 11  ALKPHOS 88 109  BILITOT 0.5 1.0  PROT 7.6 9.3*  ALBUMIN 3.7 4.7   Recent Labs  Lab 11/20/18 0001 11/21/18 1013  LIPASE 16 17   No results for input(s): AMMONIA in the last 168 hours. CBC: Recent Labs  Lab 11/20/18 0001 11/21/18 1013  WBC 11.4* 8.5  HGB 10.8* 13.2  HCT 34.4* 41.3  MCV 90.5 90.2  PLT 478* 579*   Cardiac Enzymes: No results for input(s): CKTOTAL, CKMB, CKMBINDEX, TROPONINI in the  last 168 hours. BNP: BNP (last 3 results) No results for input(s): BNP in the last 8760 hours.  ProBNP (last 3 results) No results for input(s): PROBNP in the last 8760 hours.  CBG: Recent Labs  Lab 11/22/18 0740 11/22/18 1137 11/22/18 1707 11/22/18 2038 11/23/18 0732  GLUCAP 239* 167* 97 228* 84    Time spent: 31 minutes were spent in preparing this discharge including medication reconciliation, counseling, and coordination of care.  Signed:   Marry Guan, MD  [facility] 11/23/2018, 9:35 AM

## 2018-11-23 NOTE — Progress Notes (Signed)
[facility] home in stable condition.  No vomiting noted today.

## 2018-12-12 ENCOUNTER — Emergency Department (HOSPITAL_COMMUNITY)
Admission: EM | Admit: 2018-12-12 | Discharge: 2018-12-12 | Disposition: A | Discharge status: Home / Self Care | Payer: BC Managed Care – PPO | Attending: Emergency Medicine | Admitting: Emergency Medicine

## 2018-12-12 ENCOUNTER — Other Ambulatory Visit: Payer: Self-pay

## 2018-12-12 ENCOUNTER — Emergency Department (HOSPITAL_BASED_OUTPATIENT_CLINIC_OR_DEPARTMENT_OTHER)
Admit: 2018-12-12 | Discharge: 2018-12-12 | Disposition: A | Discharge status: Home / Self Care | Payer: BC Managed Care – PPO | Attending: Emergency Medicine | Admitting: Emergency Medicine

## 2018-12-12 ENCOUNTER — Encounter (HOSPITAL_COMMUNITY): Payer: Self-pay

## 2018-12-12 DIAGNOSIS — Z794 Long term (current) use of insulin: Secondary | ICD-10-CM | POA: Insufficient documentation

## 2018-12-12 DIAGNOSIS — J45909 Unspecified asthma, uncomplicated: Secondary | ICD-10-CM | POA: Insufficient documentation

## 2018-12-12 DIAGNOSIS — Z79899 Other long term (current) drug therapy: Secondary | ICD-10-CM | POA: Diagnosis not present

## 2018-12-12 DIAGNOSIS — I82612 Acute embolism and thrombosis of superficial veins of left upper extremity: Secondary | ICD-10-CM | POA: Diagnosis not present

## 2018-12-12 DIAGNOSIS — R2232 Localized swelling, mass and lump, left upper limb: Secondary | ICD-10-CM | POA: Diagnosis present

## 2018-12-12 DIAGNOSIS — Z91010 Allergy to peanuts: Secondary | ICD-10-CM | POA: Diagnosis not present

## 2018-12-12 DIAGNOSIS — I10 Essential (primary) hypertension: Secondary | ICD-10-CM | POA: Diagnosis not present

## 2018-12-12 DIAGNOSIS — E109 Type 1 diabetes mellitus without complications: Secondary | ICD-10-CM | POA: Insufficient documentation

## 2018-12-12 DIAGNOSIS — M7989 Other specified soft tissue disorders: Secondary | ICD-10-CM

## 2018-12-12 MED ORDER — NAPROXEN 375 MG PO TABS: 375.0000 mg | ORAL_TABLET | Freq: Two times a day (BID) | ORAL | 0 refills | Status: DC | PRN

## 2018-12-12 NOTE — Discharge Instructions (Addendum)
You have been seen today for arm swelling. Please read and follow all provided instructions. Return to the emergency room for worsening condition or new concerning symptoms.    The ultrasound today showed you still have a superficial venous thrombosis.  It is unchanged from the prior ultrasound.  1. Medications:  Prescription sent to your pharmacy for naproxen.  This is an anti-inflammatory medication.  Please take as prescribed.  Take with food as it can cause upset stomach.  Do not take other anti-inflammatory medications while taking naproxen such as ibuprofen, Aleve, Motrin, Goody powders. Continue usual home medications Take medications as prescribed. Please review all of the medicines and only take them if you do not have an allergy to them.   2. Treatment: rest, drink plenty of fluids.  Elevate the arm for swelling.  3. Follow Up: Please follow up with your primary doctor in 2-5 days for discussion of your diagnoses and further evaluation after today's visit; Call today to arrange your follow up.    ?

## 2018-12-12 NOTE — ED Provider Notes (Signed)
[facility] DEPT Provider Note   CSN: [id number] Arrival date & time: 12/12/18  6384     History   Chief Complaint Chief Complaint  Patient presents with   Arm Swelling    HPI [name] is a 28 y.o. female with past medical history significant for anxiety, arthritis, type 1 diabetes, diabetic gastroparesis, hypertension presents to emergency department today with chief complaint of left arm swelling x3 days.  Patient had similar presentation of left arm swelling on last hospital admission.  She had a DVT study on 11/15/2018 that was negative for DVT but did show a superficial vein thrombosis involving left cephalic vein of indeterminate age.  Patient states the swelling went away without intervention.  She denies any associated pain.  She has not taken any medications for symptoms prior to arrival.  She states the swelling is worse in the morning and then usually decreases throughout the day.  She noticed yesterday that her fingers in the left hand were swollen. Denies injury to left arm.  She denies fever, chills, chest pain, shortness of breath abdominal pain, nausea, vomiting, lower extremity edema, numbness, weakness, tingling.  Patient does not take birth control. She is not anticoagulated.  Past Medical History:  Diagnosis Date   Anxiety    Arthritis    "hands, feet, knees" (12/18/2016)   Asthma    Diabetic gastroparesis ([place])    Per gastric emptying study 07/09/16 which showed significant delayed gastric emptying.   Gallstones    Gastroparesis    GERD (gastroesophageal reflux disease)    Heart murmur    Hepatic steatosis 11/26/2014   and hepatomegaly   Hypertension    hx (12/18/2016)   Intractable cyclical vomiting syndrome    /notes 12/18/2016   Liver mass 11/26/2014   Pancreatitis, acute 11/26/2014   Pneumonia    "as a teen X 1" (12/18/2016)   Type I diabetes mellitus ([place]) 2007   IDDM.  poorly controlled,  multiple admits with DKA    Patient Active Problem List   Diagnosis Date Noted   Gastroparesis due to secondary diabetes ([place]) 11/21/2018   Diabetes mellitus ([place]) 11/13/2018   HTN (hypertension) 11/13/2018   Marijuana abuse 11/13/2018   Acute pyelonephritis 10/07/2018   Hydronephrosis of right kidney 10/05/2018   Diffuse pain    Elevated blood pressure reading 12/03/2017   Tachycardia 11/30/2017   Hypernatremia 11/02/2017   Nausea and vomiting 01/22/2017   Nausea & vomiting 01/21/2017   Intractable cyclical vomiting syndrome 12/18/2016   Leukocytosis 10/24/2016   N&V (nausea and vomiting) 10/23/2016   DKA, type 1 ([place]) 09/26/2016   Thrombocytosis ([place]) 07/08/2016   Candiduria, asymptomatic 06/23/2016   Hyponatremia 06/13/2016   Hematuria 05/13/2016   UTI (urinary tract infection) 01/22/2016   Diarrhea 11/09/2015   Acute urinary retention    Gastroparesis due to DM ([place]) 07/10/2015   GERD (gastroesophageal reflux disease) 07/10/2015   Depression with anxiety 07/10/2015   Gastroparesis 06/22/2015   Altered mental status 06/22/2015   Volume depletion 06/10/2015   Protein-calorie malnutrition, severe 06/10/2015   Hyperglycemia    Elevated bilirubin    Hematemesis with nausea    Intractable nausea and vomiting 05/28/2015   Abdominal pain in female    Abdominal pain 05/24/2015   Hypertension 05/24/2015   Dehydration    Diabetic gastroparesis ([place])    Chronic diastolic heart failure ([place]) 04/11/2015   Hematemesis 04/08/2015   DKA (diabetic ketoacidoses) ([place]) 03/22/2015   S/P laparoscopic cholecystectomy 02/11/2015  Postextubation stridor    Pancreatitis, acute 11/26/2014   Volume overload 11/26/2014   Hypokalemia 11/26/2014   Hepatic steatosis 11/26/2014   Liver mass 11/26/2014   Sepsis ([place]) 11/25/2014   Sinus tachycardia 11/25/2014   Hypomagnesemia 11/25/2014   Hypophosphatemia 11/25/2014   Elevated LFTs  11/24/2014   AKI (acute kidney injury) ([place]) 11/24/2014   Migraine headache 11/24/2014   Asthma 06/29/2012   Uncontrolled type 1 diabetes mellitus ([place]) 06/19/2010   Goiter, unspecified 06/19/2010    Past Surgical History:  Procedure Laterality Date   CHOLECYSTECTOMY N/A 02/11/2015   Procedure: LAPAROSCOPIC CHOLECYSTECTOMY WITH INTRAOPERATIVE CHOLANGIOGRAM;  Surgeon: Greer Pickerel, MD;  Location: [facility] ORS;  Service: General;  Laterality: N/A;   ESOPHAGOGASTRODUODENOSCOPY (EGD) WITH PROPOFOL Left 09/20/2014   Procedure: ESOPHAGOGASTRODUODENOSCOPY (EGD) WITH PROPOFOL;  Surgeon: Arta Silence, MD;  Location: [facility] ENDOSCOPY;  Service: Endoscopy;  Laterality: Left;   WISDOM TOOTH EXTRACTION       OB History    Gravida  2   Para  1   Term  0   Preterm  1   AB  1   Living  1     SAB  0   TAB  1   Ectopic  0   Multiple  0   Live Births  1            Home Medications    Prior to Admission medications   Medication Sig Start Date End Date Taking? Authorizing Provider  albuterol (PROVENTIL HFA;VENTOLIN HFA) 108 (90 Base) MCG/ACT inhaler Inhale 1-2 puffs into the lungs every 6 (six) hours as needed for wheezing or shortness of breath.    [provider]  blood glucose meter kit and supplies Dispense based on patient and insurance preference. Use up to four times daily as directed. (FOR ICD-10 E10.9, E11.9). 12/07/17   Eugenie Filler, MD  glucagon (GLUCAGON EMERGENCY) 1 MG injection Inject 1 mg into the muscle once as needed (For very low blood sugars. May repeat in 15-20 minutes if needed.). 12/20/16   Hongalgi, Lenis Dickinson, MD  Insulin Human (INSULIN PUMP) SOLN Inject 1 each into the skin continuous. Novolog insulin. Pt puts in her insulin pump: Medtronic 630G. Carb ratio 1-10, insulin sensitive 66. 1.10 from 12 to 12. Every 3 days needs to be replaced.    [provider]  Insulin Pen Needle 31G X 5 MM MISC 30 Units by Does not apply route at bedtime.  12/07/17   Eugenie Filler, MD  lisinopril (ZESTRIL) 2.5 MG tablet Take 2.5 mg by mouth daily.    [provider]  metoCLOPramide (REGLAN) 10 MG tablet Take 1 tablet (10 mg total) by mouth 3 (three) times daily with meals. 11/18/18 11/18/19  Kathie Dike, MD  naproxen (NAPROSYN) 375 MG tablet Take 1 tablet (375 mg total) by mouth 2 (two) times daily as needed. 12/12/18   Donie Moulton E, PA-C  ondansetron (ZOFRAN ODT) 4 MG disintegrating tablet Take 1 tablet (4 mg total) by mouth every 8 (eight) hours as needed for nausea or vomiting. 11/18/18   Kathie Dike, MD  trimethoprim (TRIMPEX) 100 MG tablet Take 100 mg by mouth daily. 11/11/18   [provider]    Family History Family History  Problem Relation Age of Onset   Heart disease Maternal Grandmother    Heart disease Maternal Grandfather    Diabetes Mother    Hyperlipidemia Mother    Hypertension Father    Heart disease Father    Hypertension  Paternal Grandmother    Cancer Paternal Grandfather     Social History Social History   Tobacco Use   Smoking status: Never Smoker   Smokeless tobacco: Never Used  Substance Use Topics   Alcohol use: No   Drug use: Yes    Types: Marijuana     Allergies   Peanut-containing drug products, Food, and Ultram [tramadol]   Review of Systems Review of Systems  Constitutional: Negative for chills and fever.  Respiratory: Negative for cough and shortness of breath.   Cardiovascular: Negative for chest pain and leg swelling.  Gastrointestinal: Negative for abdominal pain, nausea and vomiting.  Musculoskeletal: Positive for joint swelling. Negative for neck pain and neck stiffness.  Skin: Negative for rash and wound.  Allergic/Immunologic: Positive for immunocompromised state (type 1 diabetic).  Neurological: Negative for weakness and numbness.     Physical Exam Updated Vital Signs BP 121/83 (BP Location: Right Arm)    Pulse 96    Temp 98.7 F  (37.1 C) (Oral)    Resp 16    Ht 5' 3"  (1.6 m)    Wt 54.4 kg    SpO2 100%    BMI 21.26 kg/m   Physical Exam Vitals signs and nursing note reviewed.  Constitutional:      Appearance: She is well-developed. She is not ill-appearing or toxic-appearing.  HENT:     Head: Normocephalic and atraumatic.     Nose: Nose normal.  Eyes:     General: No scleral icterus.       Right eye: No discharge.        Left eye: No discharge.     Conjunctiva/sclera: Conjunctivae normal.  Neck:     Musculoskeletal: Normal range of motion.     Vascular: No JVD.  Cardiovascular:     Rate and Rhythm: Normal rate and regular rhythm.     Pulses: Normal pulses.     Heart sounds: Normal heart sounds.  Pulmonary:     Effort: Pulmonary effort is normal.     Breath sounds: Normal breath sounds.     Comments: Lungs clear to auscultation all fields.  Symmetric chest rise, normal work of breathing.  No wheezing, rales or rhonchi noted. Chest:     Chest wall: No tenderness.  Abdominal:     General: There is no distension.  Musculoskeletal: Normal range of motion.     Left shoulder: Normal.     Left elbow: Normal.     Left wrist: Normal.       Arms:     Comments: Homans sign absent bilaterally, no lower extremity edema, no palpable cords, compartments are soft  Skin:    General: Skin is warm and dry.     Capillary Refill: Capillary refill takes less than 2 seconds.  Neurological:     Mental Status: She is oriented to person, place, and time.     GCS: GCS eye subscore is 4. GCS verbal subscore is 5. GCS motor subscore is 6.     Comments: Fluent speech, no facial droop.  Psychiatric:        Behavior: Behavior normal.      ED Treatments / Results  Labs (all labs ordered are listed, but only abnormal results are displayed) Labs Reviewed - No data to display  EKG None  Radiology Ue Venous Duplex ([facility] Only)  Result Date: 12/12/2018 UPPER VENOUS STUDY  Indications: Swelling Limitations: Small  caliber vessels and body habitus. Comparison Study: 11/15/2018 age indeterminate left  cephalic vein thrombus. Performing Technologist: Maudry Mayhew MHA, RDMS, RVT, RDCS  Examination Guidelines: A complete evaluation includes B-mode imaging, spectral Doppler, color Doppler, and power Doppler as needed of all accessible portions of each vessel. Bilateral testing is considered an integral part of a complete examination. Limited examinations for reoccurring indications may be performed as noted.  Right Findings: +----------+------------+---------+-----------+----------+-------+  RIGHT      Compressible Phasicity Spontaneous Properties Summary  +----------+------------+---------+-----------+----------+-------+  Subclavian                 Yes        Yes                         +----------+------------+---------+-----------+----------+-------+  Left Findings: +----------+------------+---------+-----------+----------+-----------------+  LEFT       Compressible Phasicity Spontaneous Properties      Summary       +----------+------------+---------+-----------+----------+-----------------+  IJV            Full        Yes        Yes                                   +----------+------------+---------+-----------+----------+-----------------+  Subclavian     Full        Yes        Yes                                   +----------+------------+---------+-----------+----------+-----------------+  Axillary       Full        Yes        Yes                                   +----------+------------+---------+-----------+----------+-----------------+  Brachial       Full        Yes        Yes                                   +----------+------------+---------+-----------+----------+-----------------+  Radial         Full                                                         +----------+------------+---------+-----------+----------+-----------------+  Ulnar          Full                                                          +----------+------------+---------+-----------+----------+-----------------+  Cephalic       None                                      Age Indeterminate  +----------+------------+---------+-----------+----------+-----------------+  Basilic        Full                                                         +----------+------------+---------+-----------+----------+-----------------+  Summary:  Right: No evidence of thrombosis in the subclavian.  Left: No evidence of deep vein thrombosis in the upper extremity. Findings consistent with age indeterminate superficial vein thrombosis involving the left cephalic vein. No significant change when compared to prior study.  *See table(s) above for measurements and observations.    Preliminary     Procedures Procedures (including critical care time)  Medications Ordered in ED Medications - No data to display   Initial Impression / Assessment and Plan / ED Course  I have reviewed the triage vital signs and the nursing notes.  Pertinent labs & imaging results that were available during my care of the patient were reviewed by me and considered in my medical decision making (see chart for details).  Patient seen and examined. Patient nontoxic appearing, in no apparent distress, vitals WNL.  She has normal work of breathing, lungs are clear to auscultation all fields.  No chest pain, no pleuritic pain.  On exam she has very mild swelling the left forearm without overlying erythema or wound.  She had similar swelling to left forearm during last hospital admission.  She had DVT study on 11/15/2018 that showed superficial vein thrombosis involving cephalic vein of indeterminate age.  DVT study today shows no significant change from prior, still showing superficial vein thrombosis involving cephalic vein.  Discussed results with patient.  Will discharge with anti-inflammatory medications and recommend PCP follow-up if symptoms persist. The patient appears reasonably  screened and/or stabilized for discharge and I doubt any other medical condition or other [facility] requiring further screening, evaluation, or treatment in the ED at this time prior to discharge. The patient is safe for discharge with strict return precautions discussed. Findings and plan of care discussed with supervising physician Dr. Sedonia Small.    Portions of this note were generated with [employer]. Dictation errors may occur despite best attempts at proofreading.   Final Clinical Impressions(s) / ED Diagnoses   Final diagnoses:  Superficial venous thrombosis of arm, left    ED Discharge Orders         Ordered    naproxen (NAPROSYN) 375 MG tablet  2 times daily PRN     12/12/18 1208           Cherre Robins, PA-C 12/12/18 1226    Maudie Flakes, MD 12/14/18 [id number]

## 2018-12-12 NOTE — Progress Notes (Signed)
Left upper extremity venous duplex completed. Refer to "CV Proc" under chart review to view preliminary results.  12/12/2018 11:57 AM Kelby Aline., MHA, RVT, RDCS, RDMS

## 2018-12-12 NOTE — ED Triage Notes (Signed)
Patient c/o left arm swelling x 3 days. Patient states her left hand swells sometimes.

## 2018-12-21 ENCOUNTER — Other Ambulatory Visit: Payer: Self-pay

## 2018-12-21 ENCOUNTER — Encounter (HOSPITAL_COMMUNITY): Payer: Self-pay | Admitting: Family Medicine

## 2018-12-21 ENCOUNTER — Emergency Department (HOSPITAL_COMMUNITY): Payer: BC Managed Care – PPO

## 2018-12-21 ENCOUNTER — Emergency Department (HOSPITAL_COMMUNITY)
Admission: EM | Admit: 2018-12-21 | Discharge: 2018-12-21 | Disposition: A | Discharge status: Home / Self Care | Payer: BC Managed Care – PPO | Attending: Emergency Medicine | Admitting: Emergency Medicine

## 2018-12-21 DIAGNOSIS — R1013 Epigastric pain: Secondary | ICD-10-CM | POA: Diagnosis not present

## 2018-12-21 DIAGNOSIS — Z91010 Allergy to peanuts: Secondary | ICD-10-CM | POA: Insufficient documentation

## 2018-12-21 DIAGNOSIS — J45909 Unspecified asthma, uncomplicated: Secondary | ICD-10-CM | POA: Insufficient documentation

## 2018-12-21 DIAGNOSIS — I10 Essential (primary) hypertension: Secondary | ICD-10-CM | POA: Insufficient documentation

## 2018-12-21 DIAGNOSIS — Z79899 Other long term (current) drug therapy: Secondary | ICD-10-CM | POA: Diagnosis not present

## 2018-12-21 DIAGNOSIS — E109 Type 1 diabetes mellitus without complications: Secondary | ICD-10-CM | POA: Insufficient documentation

## 2018-12-21 DIAGNOSIS — R1010 Upper abdominal pain, unspecified: Secondary | ICD-10-CM

## 2018-12-21 LAB — COMPREHENSIVE METABOLIC PANEL
ALT: 10 U/L (ref 0–44)
AST: 16 U/L (ref 15–41)
Albumin: 4.6 g/dL (ref 3.5–5.0)
Alkaline Phosphatase: 127 U/L — ABNORMAL HIGH (ref 38–126)
Anion gap: 14 (ref 5–15)
BUN: 16 mg/dL (ref 6–20)
CO2: 17 mmol/L — ABNORMAL LOW (ref 22–32)
Calcium: 10 mg/dL (ref 8.9–10.3)
Chloride: 107 mmol/L (ref 98–111)
Creatinine, Ser: 0.77 mg/dL (ref 0.44–1.00)
GFR calc Af Amer: >60 mL/min (ref >60)
GFR calc non Af Amer: >60 mL/min (ref >60)
Glucose, Bld: 99 mg/dL (ref 70–99)
Potassium: 3.9 mmol/L (ref 3.5–5.1)
Sodium: 138 mmol/L (ref 135–145)
Total Bilirubin: 0.6 mg/dL (ref 0.3–1.2)
Total Protein: 9.1 g/dL — ABNORMAL HIGH (ref 6.5–8.1)

## 2018-12-21 LAB — CBC WITH DIFFERENTIAL/PLATELET
Abs Immature Granulocytes: 0.03 10*3/uL (ref 0.00–0.07)
Basophils Absolute: 0 10*3/uL (ref 0.0–0.1)
Basophils Relative: 0 %
Eosinophils Absolute: 0.2 10*3/uL (ref 0.0–0.5)
Eosinophils Relative: 2 %
HCT: 40.7 % (ref 36.0–46.0)
Hemoglobin: 12.7 g/dL (ref 12.0–15.0)
Immature Granulocytes: 0 %
Lymphocytes Relative: 11 %
Lymphs Abs: 1.2 10*3/uL (ref 0.7–4.0)
MCH: 27.5 pg (ref 26.0–34.0)
MCHC: 31.2 g/dL (ref 30.0–36.0)
MCV: 88.3 fL (ref 80.0–100.0)
Monocytes Absolute: 0.7 10*3/uL (ref 0.1–1.0)
Monocytes Relative: 6 %
Neutro Abs: 8.9 10*3/uL — ABNORMAL HIGH (ref 1.7–7.7)
Neutrophils Relative %: 81 %
Platelets: 562 10*3/uL — ABNORMAL HIGH (ref 150–400)
RBC: 4.61 MIL/uL (ref 3.87–5.11)
RDW: 14.1 % (ref 11.5–15.5)
WBC: 11.1 10*3/uL — ABNORMAL HIGH (ref 4.0–10.5)
nRBC: 0 % (ref 0.0–0.2)

## 2018-12-21 LAB — I-STAT BETA HCG BLOOD, ED (MC, WL, AP ONLY): I-stat hCG, quantitative: <5 m[IU]/mL (ref <5)

## 2018-12-21 LAB — LIPASE, BLOOD: Lipase: 16 U/L (ref 11–51)

## 2018-12-21 LAB — CBG MONITORING, ED
Glucose-Capillary: 58 mg/dL — ABNORMAL LOW (ref 70–99)
Glucose-Capillary: 113 mg/dL — ABNORMAL HIGH (ref 70–99)

## 2018-12-21 MED ORDER — PROMETHAZINE HCL 25 MG/ML IJ SOLN
12.5000 mg | Freq: Once | INTRAMUSCULAR | Status: AC
  Administered 2018-12-21: 21:00:00 12.5 mg via INTRAVENOUS
  Filled 2018-12-21: qty 1

## 2018-12-21 MED ORDER — HYDROCODONE-ACETAMINOPHEN 5-325 MG PO TABS: 1.0000 | ORAL_TABLET | Freq: Four times a day (QID) | ORAL | 0 refills | Status: DC | PRN

## 2018-12-21 MED ORDER — SODIUM CHLORIDE 0.9 % IV BOLUS
1000.0000 mL | Freq: Once | INTRAVENOUS | Status: AC
  Administered 2018-12-21: 21:00:00 1000 mL via INTRAVENOUS

## 2018-12-21 MED ORDER — DEXTROSE 50 % IV SOLN
25.0000 mL | Freq: Once | INTRAVENOUS | Status: DC
  Filled 2018-12-21: qty 50

## 2018-12-21 MED ORDER — HYDROMORPHONE HCL 1 MG/ML IJ SOLN
1.0000 mg | Freq: Once | INTRAMUSCULAR | Status: AC
  Administered 2018-12-21: 23:00:00 1 mg via INTRAVENOUS
  Filled 2018-12-21: qty 1

## 2018-12-21 MED ORDER — HYDROMORPHONE HCL 1 MG/ML IJ SOLN
1.0000 mg | Freq: Once | INTRAMUSCULAR | Status: AC
  Administered 2018-12-21: 21:00:00 1 mg via INTRAVENOUS
  Filled 2018-12-21: qty 1

## 2018-12-21 NOTE — ED Triage Notes (Signed)
Patient is complaining of abd pain with nausea and vomiting. This episode started today.

## 2018-12-21 NOTE — ED Provider Notes (Signed)
[facility] DEPT Provider Note   CSN: [id number] Arrival date & time: 12/21/18  1909     History   Chief Complaint Chief Complaint  Patient presents with  . Abdominal Pain  . Emesis    HPI [name] is a 28 y.o. female.     Patient has a history of gastroparesis and chronic abdominal pain.  Patient complains of vomiting and abdominal pain  The history is provided by the patient. No language interpreter was used.  Abdominal Pain Pain location:  Epigastric Pain quality: aching   Pain radiates to:  Does not radiate Pain severity:  Moderate Onset quality:  Sudden Timing:  Constant Progression:  Waxing and waning Chronicity:  New Context: not alcohol use   Relieved by:  Nothing Worsened by:  Nothing Associated symptoms: vomiting   Associated symptoms: no chest pain, no cough, no diarrhea, no fatigue and no hematuria   Emesis Associated symptoms: abdominal pain   Associated symptoms: no cough, no diarrhea and no headaches     Past Medical History:  Diagnosis Date  . Anxiety   . Arthritis    "hands, feet, knees" (12/18/2016)  . Asthma   . Diabetic gastroparesis ([place])    Per gastric emptying study 07/09/16 which showed significant delayed gastric emptying.  . Gallstones   . Gastroparesis   . GERD (gastroesophageal reflux disease)   . Heart murmur   . Hepatic steatosis 11/26/2014   and hepatomegaly  . Hypertension    hx (12/18/2016)  . Intractable cyclical vomiting syndrome    Archie Endo 12/18/2016  . Liver mass 11/26/2014  . Pancreatitis, acute 11/26/2014  . Pneumonia    "as a teen X 1" (12/18/2016)  . Type I diabetes mellitus ([place]) 2007   IDDM.  poorly controlled, multiple admits with DKA    Patient Active Problem List   Diagnosis Date Noted  . Gastroparesis due to secondary diabetes ([place]) 11/21/2018  . Diabetes mellitus ([place]) 11/13/2018  . HTN (hypertension) 11/13/2018  . Marijuana abuse 11/13/2018  . Acute  pyelonephritis 10/07/2018  . Hydronephrosis of right kidney 10/05/2018  . Diffuse pain   . Elevated blood pressure reading 12/03/2017  . Tachycardia 11/30/2017  . Hypernatremia 11/02/2017  . Nausea and vomiting 01/22/2017  . Nausea & vomiting 01/21/2017  . Intractable cyclical vomiting syndrome 12/18/2016  . Leukocytosis 10/24/2016  . N&V (nausea and vomiting) 10/23/2016  . DKA, type 1 ([place]) 09/26/2016  . Thrombocytosis ([place]) 07/08/2016  . Candiduria, asymptomatic 06/23/2016  . Hyponatremia 06/13/2016  . Hematuria 05/13/2016  . UTI (urinary tract infection) 01/22/2016  . Diarrhea 11/09/2015  . Acute urinary retention   . Gastroparesis due to DM ([place]) 07/10/2015  . GERD (gastroesophageal reflux disease) 07/10/2015  . Depression with anxiety 07/10/2015  . Gastroparesis 06/22/2015  . Altered mental status 06/22/2015  . Volume depletion 06/10/2015  . Protein-calorie malnutrition, severe 06/10/2015  . Hyperglycemia   . Elevated bilirubin   . Hematemesis with nausea   . Intractable nausea and vomiting 05/28/2015  . Abdominal pain in female   . Abdominal pain 05/24/2015  . Hypertension 05/24/2015  . Dehydration   . Diabetic gastroparesis ([place])   . Chronic diastolic heart failure ([place]) 04/11/2015  . Hematemesis 04/08/2015  . DKA (diabetic ketoacidoses) ([place]) 03/22/2015  . S/P laparoscopic cholecystectomy 02/11/2015  . Postextubation stridor   . Pancreatitis, acute 11/26/2014  . Volume overload 11/26/2014  . Hypokalemia 11/26/2014  . Hepatic steatosis 11/26/2014  . Liver mass 11/26/2014  . Sepsis ([place])  11/25/2014  . Sinus tachycardia 11/25/2014  . Hypomagnesemia 11/25/2014  . Hypophosphatemia 11/25/2014  . Elevated LFTs 11/24/2014  . AKI (acute kidney injury) ([place]) 11/24/2014  . Migraine headache 11/24/2014  . Asthma 06/29/2012  . Uncontrolled type 1 diabetes mellitus ([place]) 06/19/2010  . Goiter, unspecified 06/19/2010    Past Surgical History:  Procedure Laterality  Date  . CHOLECYSTECTOMY N/A 02/11/2015   Procedure: LAPAROSCOPIC CHOLECYSTECTOMY WITH INTRAOPERATIVE CHOLANGIOGRAM;  Surgeon: Greer Pickerel, MD;  Location: [facility] ORS;  Service: General;  Laterality: N/A;  . ESOPHAGOGASTRODUODENOSCOPY (EGD) WITH PROPOFOL Left 09/20/2014   Procedure: ESOPHAGOGASTRODUODENOSCOPY (EGD) WITH PROPOFOL;  Surgeon: Arta Silence, MD;  Location: [facility] ENDOSCOPY;  Service: Endoscopy;  Laterality: Left;  . WISDOM TOOTH EXTRACTION       OB History    Gravida  2   Para  1   Term  0   Preterm  1   AB  1   Living  1     SAB  0   TAB  1   Ectopic  0   Multiple  0   Live Births  1            Home Medications    Prior to Admission medications   Medication Sig Start Date End Date Taking? Authorizing Provider  albuterol (PROVENTIL HFA;VENTOLIN HFA) 108 (90 Base) MCG/ACT inhaler Inhale 1-2 puffs into the lungs every 6 (six) hours as needed for wheezing or shortness of breath.   Yes [provider]  lisinopril (ZESTRIL) 2.5 MG tablet Take 2.5 mg by mouth daily.   Yes [provider]  metoCLOPramide (REGLAN) 10 MG tablet Take 1 tablet (10 mg total) by mouth 3 (three) times daily with meals. 11/18/18 11/18/19 Yes Kathie Dike, MD  naproxen (NAPROSYN) 375 MG tablet Take 1 tablet (375 mg total) by mouth 2 (two) times daily as needed. Patient taking differently: Take 375 mg by mouth 2 (two) times daily as needed for moderate pain.  12/12/18  Yes Albrizze, Kaitlyn E, PA-C  NOVOLOG 100 UNIT/ML injection Inject 50 Units into the skin as directed. Inject up to 50 units/day. 12/08/18  Yes [provider]  ondansetron (ZOFRAN ODT) 4 MG disintegrating tablet Take 1 tablet (4 mg total) by mouth every 8 (eight) hours as needed for nausea or vomiting. 11/18/18  Yes Kathie Dike, MD  promethazine (PHENERGAN) 25 MG tablet Take 25 mg by mouth every 8 (eight) hours as needed for nausea or vomiting.  11/30/18  Yes [provider]  traZODone  (DESYREL) 50 MG tablet Take 25-50 mg by mouth at bedtime as needed for sleep.  11/30/18  Yes [provider]  trimethoprim (TRIMPEX) 100 MG tablet Take 100 mg by mouth daily. 11/11/18  Yes [provider]  blood glucose meter kit and supplies Dispense based on patient and insurance preference. Use up to four times daily as directed. (FOR ICD-10 E10.9, E11.9). 12/07/17   Eugenie Filler, MD  glucagon (GLUCAGON EMERGENCY) 1 MG injection Inject 1 mg into the muscle once as needed (For very low blood sugars. May repeat in 15-20 minutes if needed.). 12/20/16   Hongalgi, Lenis Dickinson, MD  HYDROcodone-acetaminophen (NORCO/VICODIN) 5-325 MG tablet Take 1 tablet by mouth every 6 (six) hours as needed for moderate pain. 12/21/18   Milton Ferguson, MD  Insulin Pen Needle 31G X 5 MM MISC 30 Units by Does not apply route at bedtime. 12/07/17   Eugenie Filler, MD    Family History Family History  Problem  Relation Age of Onset  . Heart disease Maternal Grandmother   . Heart disease Maternal Grandfather   . Diabetes Mother   . Hyperlipidemia Mother   . Hypertension Father   . Heart disease Father   . Hypertension Paternal Grandmother   . Cancer Paternal Grandfather     Social History Social History   Tobacco Use  . Smoking status: Never Smoker  . Smokeless tobacco: Never Used  Substance Use Topics  . Alcohol use: No  . Drug use: Yes    Types: Marijuana     Allergies   Peanut-containing drug products, Food, and Ultram [tramadol]   Review of Systems Review of Systems  Constitutional: Negative for appetite change and fatigue.  HENT: Negative for congestion, ear discharge and sinus pressure.   Eyes: Negative for discharge.  Respiratory: Negative for cough.   Cardiovascular: Negative for chest pain.  Gastrointestinal: Positive for abdominal pain and vomiting. Negative for diarrhea.  Genitourinary: Negative for frequency and hematuria.  Musculoskeletal: Negative for back  pain.  Skin: Negative for rash.  Neurological: Negative for seizures and headaches.  Psychiatric/Behavioral: Negative for hallucinations.     Physical Exam Updated Vital Signs BP (!) 145/95   Pulse (!) 115   Temp 98.1 F (36.7 C) (Oral)   Resp 20   LMP 12/18/2018 Comment: neg preg test  SpO2 100%   Physical Exam Vitals signs reviewed.  Constitutional:      Appearance: She is well-developed.  HENT:     Head: Normocephalic.     Nose: Nose normal.  Eyes:     General: No scleral icterus.    Conjunctiva/sclera: Conjunctivae normal.  Neck:     Musculoskeletal: Neck supple.     Thyroid: No thyromegaly.  Cardiovascular:     Rate and Rhythm: Normal rate and regular rhythm.     Heart sounds: No murmur. No friction rub. No gallop.   Pulmonary:     Breath sounds: No stridor. No wheezing or rales.  Chest:     Chest wall: No tenderness.  Abdominal:     General: There is no distension.     Tenderness: There is abdominal tenderness. There is no rebound.  Musculoskeletal: Normal range of motion.  Lymphadenopathy:     Cervical: No cervical adenopathy.  Skin:    Findings: No erythema or rash.  Neurological:     Mental Status: She is oriented to person, place, and time.     Motor: No abnormal muscle tone.     Coordination: Coordination normal.  Psychiatric:        Behavior: Behavior normal.      ED Treatments / Results  Labs (all labs ordered are listed, but only abnormal results are displayed) Labs Reviewed  CBC WITH DIFFERENTIAL/PLATELET - Abnormal; Notable for the following components:      Result Value   WBC 11.1 (*)    Platelets 562 (*)    Neutro Abs 8.9 (*)    All other components within normal limits  COMPREHENSIVE METABOLIC PANEL - Abnormal; Notable for the following components:   CO2 17 (*)    Total Protein 9.1 (*)    Alkaline Phosphatase 127 (*)    All other components within normal limits  CBG MONITORING, ED - Abnormal; Notable for the following  components:   Glucose-Capillary 58 (*)    All other components within normal limits  CBG MONITORING, ED - Abnormal; Notable for the following components:   Glucose-Capillary 113 (*)    All other components  within normal limits  LIPASE, BLOOD  I-STAT BETA HCG BLOOD, ED ([facility], [facility], [facility] ONLY)    EKG None  Radiology Acute Abd Series  Result Date: 12/21/2018 CLINICAL DATA:  Vomiting EXAM: DG ABDOMEN ACUTE W/ 1V CHEST COMPARISON:  10/11/2018 FINDINGS: Prior cholecystectomy. The bowel gas pattern is normal. There is no evidence of free intraperitoneal air. No suspicious radio-opaque calculi or other significant radiographic abnormality is seen. Heart size and mediastinal contours are within normal limits. Both lungs are clear. IMPRESSION: Negative abdominal radiographs.  No acute cardiopulmonary disease. Electronically Signed   By: Rolm Baptise M.D.   On: 12/21/2018 21:29    Procedures Procedures (including critical care time)  Medications Ordered in ED Medications  dextrose 50 % solution 25 mL (0 mLs Intravenous Hold 12/21/18 2043)  sodium chloride 0.9 % bolus 1,000 mL (0 mLs Intravenous Stopped 12/21/18 2207)  promethazine (PHENERGAN) injection 12.5 mg (12.5 mg Intravenous Given 12/21/18 2032)  HYDROmorphone (DILAUDID) injection 1 mg (1 mg Intravenous Given 12/21/18 2033)  HYDROmorphone (DILAUDID) injection 1 mg (1 mg Intravenous Given 12/21/18 2249)     Initial Impression / Assessment and Plan / ED Course  I have reviewed the triage vital signs and the nursing notes.  Pertinent labs & imaging results that were available during my care of the patient were reviewed by me and considered in my medical decision making (see chart for details).   Patient with gastroparesis.  Patient improved with treatment.  Patient will follow-up as needed and is given pain medicine to take at home and she has nausea medicine      Final Clinical Impressions(s) / ED Diagnoses   Final diagnoses:  Pain of  upper abdomen    ED Discharge Orders         Ordered    HYDROcodone-acetaminophen (NORCO/VICODIN) 5-325 MG tablet  Every 6 hours PRN,   Status:  Discontinued     12/21/18 2321    HYDROcodone-acetaminophen (NORCO/VICODIN) 5-325 MG tablet  Every 6 hours PRN     12/21/18 2322           Milton Ferguson, MD 12/21/18 2326

## 2018-12-21 NOTE — ED Notes (Signed)
Pt verbalized discharge instructions and follow up care. Alert and ambulatory. No IV.  

## 2018-12-21 NOTE — ED Notes (Signed)
Provided patient orange juice and milk immediately after blood sugar resulted low.

## 2018-12-21 NOTE — Discharge Instructions (Addendum)
Take your nausea medicine you have at home and follow-up with your doctor if any problem

## 2018-12-21 NOTE — ED Notes (Signed)
ED Provider at bedside. 

## 2018-12-24 ENCOUNTER — Encounter (HOSPITAL_COMMUNITY): Payer: Self-pay

## 2018-12-24 ENCOUNTER — Observation Stay (HOSPITAL_COMMUNITY): Payer: BC Managed Care – PPO

## 2018-12-24 ENCOUNTER — Other Ambulatory Visit: Payer: Self-pay

## 2018-12-24 ENCOUNTER — Inpatient Hospital Stay (HOSPITAL_COMMUNITY)
Admission: EM | Admit: 2018-12-24 | Discharge: 2018-12-29 | DRG: 638 | Disposition: A | Discharge status: Home / Self Care | Payer: BC Managed Care – PPO | Attending: Internal Medicine | Admitting: Internal Medicine

## 2018-12-24 DIAGNOSIS — E1010 Type 1 diabetes mellitus with ketoacidosis without coma: Secondary | ICD-10-CM | POA: Diagnosis not present

## 2018-12-24 DIAGNOSIS — Z8719 Personal history of other diseases of the digestive system: Secondary | ICD-10-CM

## 2018-12-24 DIAGNOSIS — K219 Gastro-esophageal reflux disease without esophagitis: Secondary | ICD-10-CM | POA: Diagnosis present

## 2018-12-24 DIAGNOSIS — Z794 Long term (current) use of insulin: Secondary | ICD-10-CM

## 2018-12-24 DIAGNOSIS — R112 Nausea with vomiting, unspecified: Secondary | ICD-10-CM | POA: Diagnosis not present

## 2018-12-24 DIAGNOSIS — T85694A Other mechanical complication of insulin pump, initial encounter: Secondary | ICD-10-CM | POA: Diagnosis present

## 2018-12-24 DIAGNOSIS — J45909 Unspecified asthma, uncomplicated: Secondary | ICD-10-CM | POA: Diagnosis present

## 2018-12-24 DIAGNOSIS — E1110 Type 2 diabetes mellitus with ketoacidosis without coma: Secondary | ICD-10-CM

## 2018-12-24 DIAGNOSIS — Z9049 Acquired absence of other specified parts of digestive tract: Secondary | ICD-10-CM

## 2018-12-24 DIAGNOSIS — K3184 Gastroparesis: Secondary | ICD-10-CM | POA: Diagnosis present

## 2018-12-24 DIAGNOSIS — Z885 Allergy status to narcotic agent status: Secondary | ICD-10-CM

## 2018-12-24 DIAGNOSIS — E876 Hypokalemia: Secondary | ICD-10-CM | POA: Diagnosis not present

## 2018-12-24 DIAGNOSIS — Z8249 Family history of ischemic heart disease and other diseases of the circulatory system: Secondary | ICD-10-CM

## 2018-12-24 DIAGNOSIS — Z8349 Family history of other endocrine, nutritional and metabolic diseases: Secondary | ICD-10-CM

## 2018-12-24 DIAGNOSIS — Z20828 Contact with and (suspected) exposure to other viral communicable diseases: Secondary | ICD-10-CM | POA: Diagnosis present

## 2018-12-24 DIAGNOSIS — E1043 Type 1 diabetes mellitus with diabetic autonomic (poly)neuropathy: Secondary | ICD-10-CM | POA: Diagnosis present

## 2018-12-24 DIAGNOSIS — I10 Essential (primary) hypertension: Secondary | ICD-10-CM | POA: Diagnosis present

## 2018-12-24 DIAGNOSIS — Z833 Family history of diabetes mellitus: Secondary | ICD-10-CM

## 2018-12-24 DIAGNOSIS — K760 Fatty (change of) liver, not elsewhere classified: Secondary | ICD-10-CM | POA: Diagnosis present

## 2018-12-24 DIAGNOSIS — D473 Essential (hemorrhagic) thrombocythemia: Secondary | ICD-10-CM | POA: Diagnosis present

## 2018-12-24 LAB — BASIC METABOLIC PANEL
Anion gap: 18 — ABNORMAL HIGH (ref 5–15)
Anion gap: 17 — ABNORMAL HIGH (ref 5–15)
BUN: 15 mg/dL (ref 6–20)
BUN: 14 mg/dL (ref 6–20)
CO2: 15 mmol/L — ABNORMAL LOW (ref 22–32)
CO2: 13 mmol/L — ABNORMAL LOW (ref 22–32)
Calcium: 9.1 mg/dL (ref 8.9–10.3)
Calcium: 9.3 mg/dL (ref 8.9–10.3)
Chloride: 105 mmol/L (ref 98–111)
Chloride: 111 mmol/L (ref 98–111)
Creatinine, Ser: 0.99 mg/dL (ref 0.44–1.00)
Creatinine, Ser: 0.83 mg/dL (ref 0.44–1.00)
GFR calc Af Amer: >60 mL/min (ref >60)
GFR calc Af Amer: >60 mL/min (ref >60)
GFR calc non Af Amer: >60 mL/min (ref >60)
GFR calc non Af Amer: >60 mL/min (ref >60)
Glucose, Bld: 360 mg/dL — ABNORMAL HIGH (ref 70–99)
Glucose, Bld: 224 mg/dL — ABNORMAL HIGH (ref 70–99)
Potassium: 5.4 mmol/L — ABNORMAL HIGH (ref 3.5–5.1)
Potassium: 4.3 mmol/L (ref 3.5–5.1)
Sodium: 138 mmol/L (ref 135–145)
Sodium: 141 mmol/L (ref 135–145)

## 2018-12-24 LAB — CBC WITH DIFFERENTIAL/PLATELET
Abs Immature Granulocytes: 0.02 10*3/uL (ref 0.00–0.07)
Basophils Absolute: 0 10*3/uL (ref 0.0–0.1)
Basophils Relative: 0 %
Eosinophils Absolute: 0.1 10*3/uL (ref 0.0–0.5)
Eosinophils Relative: 1 %
HCT: 43.1 % (ref 36.0–46.0)
Hemoglobin: 13.8 g/dL (ref 12.0–15.0)
Immature Granulocytes: 0 %
Lymphocytes Relative: 15 %
Lymphs Abs: 1.4 10*3/uL (ref 0.7–4.0)
MCH: 27.5 pg (ref 26.0–34.0)
MCHC: 32 g/dL (ref 30.0–36.0)
MCV: 86 fL (ref 80.0–100.0)
Monocytes Absolute: 0.3 10*3/uL (ref 0.1–1.0)
Monocytes Relative: 3 %
Neutro Abs: 7.2 10*3/uL (ref 1.7–7.7)
Neutrophils Relative %: 81 %
Platelets: 616 10*3/uL — ABNORMAL HIGH (ref 150–400)
RBC: 5.01 MIL/uL (ref 3.87–5.11)
RDW: 13.7 % (ref 11.5–15.5)
WBC: 9 10*3/uL (ref 4.0–10.5)
nRBC: 0 % (ref 0.0–0.2)

## 2018-12-24 LAB — BLOOD GAS, VENOUS
Acid-base deficit: 3.6 mmol/L — ABNORMAL HIGH (ref 0.0–2.0)
Bicarbonate: 22 mmol/L (ref 20.0–28.0)
O2 Saturation: 45.6 %
Patient temperature: 98.6
pCO2, Ven: 44.7 mmHg (ref 44.0–60.0)
pH, Ven: 7.313 (ref 7.250–7.430)
pO2, Ven: <31 mmHg — CL (ref 32.0–45.0)

## 2018-12-24 LAB — COMPREHENSIVE METABOLIC PANEL
ALT: 13 U/L (ref 0–44)
AST: 18 U/L (ref 15–41)
Albumin: 5.1 g/dL — ABNORMAL HIGH (ref 3.5–5.0)
Alkaline Phosphatase: 142 U/L — ABNORMAL HIGH (ref 38–126)
Anion gap: 20 — ABNORMAL HIGH (ref 5–15)
BUN: 14 mg/dL (ref 6–20)
CO2: 20 mmol/L — ABNORMAL LOW (ref 22–32)
Calcium: 10.5 mg/dL — ABNORMAL HIGH (ref 8.9–10.3)
Chloride: 99 mmol/L (ref 98–111)
Creatinine, Ser: 0.78 mg/dL (ref 0.44–1.00)
GFR calc Af Amer: >60 mL/min (ref >60)
GFR calc non Af Amer: >60 mL/min (ref >60)
Glucose, Bld: 271 mg/dL — ABNORMAL HIGH (ref 70–99)
Potassium: 3.7 mmol/L (ref 3.5–5.1)
Sodium: 139 mmol/L (ref 135–145)
Total Bilirubin: 0.9 mg/dL (ref 0.3–1.2)
Total Protein: 10.3 g/dL — ABNORMAL HIGH (ref 6.5–8.1)

## 2018-12-24 LAB — URINALYSIS, MICROSCOPIC (REFLEX)
Bacteria, UA: NONE SEEN
Squamous Epithelial / HPF: NONE SEEN (ref 0–5)

## 2018-12-24 LAB — CBG MONITORING, ED
Glucose-Capillary: 215 mg/dL — ABNORMAL HIGH (ref 70–99)
Glucose-Capillary: 317 mg/dL — ABNORMAL HIGH (ref 70–99)

## 2018-12-24 LAB — URINALYSIS, ROUTINE W REFLEX MICROSCOPIC
Bilirubin Urine: NEGATIVE
Glucose, UA: >=500 mg/dL — AB
Ketones, ur: >80 mg/dL — AB
Nitrite: NEGATIVE
Protein, ur: 100 mg/dL — AB
Specific Gravity, Urine: 1.025 (ref 1.005–1.030)
pH: 6 (ref 5.0–8.0)

## 2018-12-24 LAB — LACTIC ACID, PLASMA: Lactic Acid, Venous: 1.4 mmol/L (ref 0.5–1.9)

## 2018-12-24 LAB — SARS CORONAVIRUS 2 (TAT 6-24 HRS): SARS Coronavirus 2: NEGATIVE

## 2018-12-24 LAB — GLUCOSE, CAPILLARY
Glucose-Capillary: 177 mg/dL — ABNORMAL HIGH (ref 70–99)
Glucose-Capillary: 187 mg/dL — ABNORMAL HIGH (ref 70–99)
Glucose-Capillary: 195 mg/dL — ABNORMAL HIGH (ref 70–99)
Glucose-Capillary: 201 mg/dL — ABNORMAL HIGH (ref 70–99)
Glucose-Capillary: 202 mg/dL — ABNORMAL HIGH (ref 70–99)
Glucose-Capillary: 209 mg/dL — ABNORMAL HIGH (ref 70–99)
Glucose-Capillary: 271 mg/dL — ABNORMAL HIGH (ref 70–99)
Glucose-Capillary: 379 mg/dL — ABNORMAL HIGH (ref 70–99)

## 2018-12-24 LAB — MRSA PCR SCREENING: MRSA by PCR: NEGATIVE

## 2018-12-24 LAB — LIPASE, BLOOD: Lipase: 16 U/L (ref 11–51)

## 2018-12-24 LAB — BETA-HYDROXYBUTYRIC ACID: Beta-Hydroxybutyric Acid: 2.88 mmol/L — ABNORMAL HIGH (ref 0.05–0.27)

## 2018-12-24 MED ORDER — INSULIN REGULAR(HUMAN) IN NACL 100-0.9 UT/100ML-% IV SOLN
INTRAVENOUS | Status: DC
  Administered 2018-12-24: 8 [IU]/h via INTRAVENOUS
  Filled 2018-12-24: qty 100

## 2018-12-24 MED ORDER — METOCLOPRAMIDE HCL 5 MG/ML IJ SOLN
10.0000 mg | Freq: Four times a day (QID) | INTRAMUSCULAR | Status: DC
  Administered 2018-12-24 – 2018-12-29 (×20): 10 mg via INTRAVENOUS
  Filled 2018-12-24 (×20): qty 2

## 2018-12-24 MED ORDER — TRAZODONE HCL 50 MG PO TABS: 25.0000 mg | ORAL_TABLET | Freq: Every evening | ORAL | Status: DC | PRN

## 2018-12-24 MED ORDER — HYDROCODONE-ACETAMINOPHEN 5-325 MG PO TABS
1.0000 | ORAL_TABLET | Freq: Four times a day (QID) | ORAL | Status: DC | PRN
  Administered 2018-12-24 – 2018-12-27 (×5): 1 via ORAL
  Filled 2018-12-24 (×6): qty 1

## 2018-12-24 MED ORDER — PROMETHAZINE HCL 25 MG/ML IJ SOLN
12.5000 mg | Freq: Once | INTRAMUSCULAR | Status: AC
  Administered 2018-12-24: 12.5 mg via INTRAVENOUS
  Filled 2018-12-24: qty 1

## 2018-12-24 MED ORDER — CHLORHEXIDINE GLUCONATE CLOTH 2 % EX PADS
6.0000 | MEDICATED_PAD | Freq: Every day | CUTANEOUS | Status: DC
  Administered 2018-12-24 – 2018-12-29 (×3): 6 via TOPICAL

## 2018-12-24 MED ORDER — POTASSIUM CHLORIDE 10 MEQ/100ML IV SOLN
10.0000 meq | INTRAVENOUS | Status: AC
  Administered 2018-12-24: 10 meq via INTRAVENOUS
  Filled 2018-12-24 (×2): qty 100

## 2018-12-24 MED ORDER — ONDANSETRON HCL 4 MG/2ML IJ SOLN
4.0000 mg | Freq: Four times a day (QID) | INTRAMUSCULAR | Status: DC | PRN
  Administered 2018-12-24 – 2018-12-28 (×11): 4 mg via INTRAVENOUS
  Filled 2018-12-24 (×11): qty 2

## 2018-12-24 MED ORDER — PROMETHAZINE HCL 25 MG/ML IJ SOLN
12.5000 mg | Freq: Four times a day (QID) | INTRAMUSCULAR | Status: DC | PRN
  Administered 2018-12-24 – 2018-12-28 (×12): 12.5 mg via INTRAVENOUS
  Filled 2018-12-24 (×13): qty 1

## 2018-12-24 MED ORDER — SODIUM CHLORIDE 0.9 % IV SOLN
INTRAVENOUS | Status: DC
  Administered 2018-12-24: 16:00:00 via INTRAVENOUS

## 2018-12-24 MED ORDER — ORAL CARE MOUTH RINSE
15.0000 mL | Freq: Two times a day (BID) | OROMUCOSAL | Status: DC
  Administered 2018-12-24 – 2018-12-29 (×5): 15 mL via OROMUCOSAL

## 2018-12-24 MED ORDER — DEXTROSE-NACL 5-0.45 % IV SOLN
INTRAVENOUS | Status: DC
  Administered 2018-12-24 – 2018-12-25 (×2): via INTRAVENOUS

## 2018-12-24 MED ORDER — ACETAMINOPHEN 325 MG PO TABS
650.0000 mg | ORAL_TABLET | ORAL | Status: DC | PRN
  Administered 2018-12-24: 650 mg via ORAL
  Filled 2018-12-24: qty 2

## 2018-12-24 MED ORDER — METOCLOPRAMIDE HCL 5 MG/ML IJ SOLN
10.0000 mg | Freq: Once | INTRAMUSCULAR | Status: AC
  Administered 2018-12-24: 10 mg via INTRAVENOUS
  Filled 2018-12-24: qty 2

## 2018-12-24 MED ORDER — HYDROMORPHONE HCL 1 MG/ML IJ SOLN
1.0000 mg | Freq: Once | INTRAMUSCULAR | Status: AC
  Administered 2018-12-24: 1 mg via INTRAVENOUS
  Filled 2018-12-24: qty 1

## 2018-12-24 MED ORDER — DEXTROSE 50 % IV SOLN: 0.0000 mL | INTRAVENOUS | Status: DC | PRN

## 2018-12-24 MED ORDER — ENOXAPARIN SODIUM 40 MG/0.4ML ~~LOC~~ SOLN
40.0000 mg | SUBCUTANEOUS | Status: DC
  Administered 2018-12-24 – 2018-12-28 (×4): 40 mg via SUBCUTANEOUS
  Filled 2018-12-24 (×5): qty 0.4

## 2018-12-24 MED ORDER — SODIUM CHLORIDE 0.9 % IV BOLUS
1000.0000 mL | Freq: Once | INTRAVENOUS | Status: AC
  Administered 2018-12-24: 1000 mL via INTRAVENOUS

## 2018-12-24 MED ORDER — MORPHINE SULFATE (PF) 2 MG/ML IV SOLN
1.0000 mg | INTRAVENOUS | Status: DC | PRN
  Administered 2018-12-24 – 2018-12-26 (×11): 2 mg via INTRAVENOUS
  Administered 2018-12-26: 1 mg via INTRAVENOUS
  Administered 2018-12-26 – 2018-12-27 (×3): 2 mg via INTRAVENOUS
  Administered 2018-12-27: 1 mg via INTRAVENOUS
  Filled 2018-12-24 (×16): qty 1

## 2018-12-24 NOTE — Progress Notes (Signed)
Consult placed to IV Team; pt has one PIV, RN requesting a 2nd site;  Ultrasound used to scan both arms, upper and lower;  No suitable, compressible veins noted;  Able to place another PIV on the 3rd attempt with help from Phyliis, from the lab;  Pt moving all over in the bed;  Very poor veins; suggest central access;  Very hard to thread catheters.  RN aware.

## 2018-12-24 NOTE — ED Triage Notes (Signed)
Pt arrives with mother. Pt has had emesis since 0430. Pt has hx of gastroparesis.

## 2018-12-24 NOTE — ED Provider Notes (Signed)
[facility] DEPT Provider Note   CSN: [id number] Arrival date & time: 12/24/18  8101     History   Chief Complaint Chief Complaint  Patient presents with  . Emesis    HPI [name] is a 28 y.o. female past medical history significant for anxiety, gastroparesis, type 1 diabetes presents to emergency room today with chief complaint of emesis x5 hours.  Patient states this feels like her gastroparesis.  She has had over 15 episodes of nonbloody nonbilious emesis since symptom onset.  She reports generalized abdominal tenderness.  She rates the pain 5 out of 10 in severity.  She denies any suspicious food intake.  She denies any fever, chills, URI symptoms or recent illness, chest pain, shortness of breath, urinary symptoms, diarrhea.  She has not tried any medications for symptoms prior to arrival. History provided by patient with additional history obtained from chart review. Abdominal surgical history includes cholecystectomy.     Past Medical History:  Diagnosis Date  . Anxiety   . Arthritis    "hands, feet, knees" (12/18/2016)  . Asthma   . Diabetic gastroparesis ([place])    Per gastric emptying study 07/09/16 which showed significant delayed gastric emptying.  . Gallstones   . Gastroparesis   . GERD (gastroesophageal reflux disease)   . Heart murmur   . Hepatic steatosis 11/26/2014   and hepatomegaly  . Hypertension    hx (12/18/2016)  . Intractable cyclical vomiting syndrome    Archie Endo 12/18/2016  . Liver mass 11/26/2014  . Pancreatitis, acute 11/26/2014  . Pneumonia    "as a teen X 1" (12/18/2016)  . Type I diabetes mellitus ([place]) 2007   IDDM.  poorly controlled, multiple admits with DKA    Patient Active Problem List   Diagnosis Date Noted  . Gastroparesis due to secondary diabetes ([place]) 11/21/2018  . Diabetes mellitus ([place]) 11/13/2018  . HTN (hypertension) 11/13/2018  . Marijuana abuse 11/13/2018  . Acute pyelonephritis  10/07/2018  . Hydronephrosis of right kidney 10/05/2018  . Diffuse pain   . Elevated blood pressure reading 12/03/2017  . Tachycardia 11/30/2017  . Hypernatremia 11/02/2017  . Nausea and vomiting 01/22/2017  . Nausea & vomiting 01/21/2017  . Intractable cyclical vomiting syndrome 12/18/2016  . Leukocytosis 10/24/2016  . N&V (nausea and vomiting) 10/23/2016  . DKA, type 1 ([place]) 09/26/2016  . Thrombocytosis ([place]) 07/08/2016  . Candiduria, asymptomatic 06/23/2016  . Hyponatremia 06/13/2016  . Hematuria 05/13/2016  . UTI (urinary tract infection) 01/22/2016  . Diarrhea 11/09/2015  . Acute urinary retention   . Gastroparesis due to DM ([place]) 07/10/2015  . GERD (gastroesophageal reflux disease) 07/10/2015  . Depression with anxiety 07/10/2015  . Gastroparesis 06/22/2015  . Altered mental status 06/22/2015  . Volume depletion 06/10/2015  . Protein-calorie malnutrition, severe 06/10/2015  . Hyperglycemia   . Elevated bilirubin   . Hematemesis with nausea   . Intractable nausea and vomiting 05/28/2015  . Abdominal pain in female   . Abdominal pain 05/24/2015  . Hypertension 05/24/2015  . Dehydration   . Diabetic gastroparesis ([place])   . Chronic diastolic heart failure ([place]) 04/11/2015  . Hematemesis 04/08/2015  . DKA (diabetic ketoacidoses) ([place]) 03/22/2015  . S/P laparoscopic cholecystectomy 02/11/2015  . Postextubation stridor   . Pancreatitis, acute 11/26/2014  . Volume overload 11/26/2014  . Hypokalemia 11/26/2014  . Hepatic steatosis 11/26/2014  . Liver mass 11/26/2014  . Sepsis ([place]) 11/25/2014  . Sinus tachycardia 11/25/2014  . Hypomagnesemia 11/25/2014  . Hypophosphatemia  11/25/2014  . Elevated LFTs 11/24/2014  . AKI (acute kidney injury) ([place]) 11/24/2014  . Migraine headache 11/24/2014  . Asthma 06/29/2012  . Uncontrolled type 1 diabetes mellitus ([place]) 06/19/2010  . Goiter, unspecified 06/19/2010    Past Surgical History:  Procedure Laterality Date  .  CHOLECYSTECTOMY N/A 02/11/2015   Procedure: LAPAROSCOPIC CHOLECYSTECTOMY WITH INTRAOPERATIVE CHOLANGIOGRAM;  Surgeon: Greer Pickerel, MD;  Location: [facility] ORS;  Service: General;  Laterality: N/A;  . ESOPHAGOGASTRODUODENOSCOPY (EGD) WITH PROPOFOL Left 09/20/2014   Procedure: ESOPHAGOGASTRODUODENOSCOPY (EGD) WITH PROPOFOL;  Surgeon: Arta Silence, MD;  Location: [facility] ENDOSCOPY;  Service: Endoscopy;  Laterality: Left;  . WISDOM TOOTH EXTRACTION       OB History    Gravida  2   Para  1   Term  0   Preterm  1   AB  1   Living  1     SAB  0   TAB  1   Ectopic  0   Multiple  0   Live Births  1            Home Medications    Prior to Admission medications   Medication Sig Start Date End Date Taking? Authorizing Provider  albuterol (PROVENTIL HFA;VENTOLIN HFA) 108 (90 Base) MCG/ACT inhaler Inhale 1-2 puffs into the lungs every 6 (six) hours as needed for wheezing or shortness of breath.   Yes [provider]  HYDROcodone-acetaminophen (NORCO/VICODIN) 5-325 MG tablet Take 1 tablet by mouth every 6 (six) hours as needed for moderate pain. 12/21/18  Yes Milton Ferguson, MD  lisinopril (ZESTRIL) 2.5 MG tablet Take 2.5 mg by mouth daily.   Yes [provider]  metoCLOPramide (REGLAN) 10 MG tablet Take 1 tablet (10 mg total) by mouth 3 (three) times daily with meals. 11/18/18 11/18/19 Yes Kathie Dike, MD  naproxen (NAPROSYN) 375 MG tablet Take 1 tablet (375 mg total) by mouth 2 (two) times daily as needed. Patient taking differently: Take 375 mg by mouth 2 (two) times daily as needed for moderate pain.  12/12/18  Yes Albrizze, Kaitlyn E, PA-C  NOVOLOG 100 UNIT/ML injection Inject 50 Units into the skin as directed. Inject up to 50 units/day. 12/08/18  Yes [provider]  ondansetron (ZOFRAN ODT) 4 MG disintegrating tablet Take 1 tablet (4 mg total) by mouth every 8 (eight) hours as needed for nausea or vomiting. 11/18/18  Yes Kathie Dike, MD  promethazine  (PHENERGAN) 25 MG tablet Take 25 mg by mouth every 8 (eight) hours as needed for nausea or vomiting.  11/30/18  Yes [provider]  traZODone (DESYREL) 50 MG tablet Take 25-50 mg by mouth at bedtime as needed for sleep.  11/30/18  Yes [provider]  trimethoprim (TRIMPEX) 100 MG tablet Take 100 mg by mouth daily. 11/11/18  Yes [provider]  blood glucose meter kit and supplies Dispense based on patient and insurance preference. Use up to four times daily as directed. (FOR ICD-10 E10.9, E11.9). 12/07/17   Eugenie Filler, MD  glucagon (GLUCAGON EMERGENCY) 1 MG injection Inject 1 mg into the muscle once as needed (For very low blood sugars. May repeat in 15-20 minutes if needed.). 12/20/16   Hongalgi, Lenis Dickinson, MD  Insulin Pen Needle 31G X 5 MM MISC 30 Units by Does not apply route at bedtime. 12/07/17   Eugenie Filler, MD    Family History Family History  Problem Relation Age of Onset  . Heart disease Maternal Grandmother   .  Heart disease Maternal Grandfather   . Diabetes Mother   . Hyperlipidemia Mother   . Hypertension Father   . Heart disease Father   . Hypertension Paternal Grandmother   . Cancer Paternal Grandfather     Social History Social History   Tobacco Use  . Smoking status: Never Smoker  . Smokeless tobacco: Never Used  Substance Use Topics  . Alcohol use: No  . Drug use: Yes    Types: Marijuana     Allergies   Peanut-containing drug products, Food, and Ultram [tramadol]   Review of Systems Review of Systems All other systems are reviewed and are negative for acute change except as noted in the HPI.   Physical Exam Updated Vital Signs BP (!) 155/109   Pulse (!) 130   Resp (!) 22   LMP 12/18/2018 Comment: neg preg test  SpO2 98%   Physical Exam Vitals signs and nursing note reviewed.  Constitutional:      General: She is not in acute distress.    Appearance: She is not ill-appearing.     Comments: Patient is  actively vomiting during exam.  She is comfortable appearing, rolling around on the stretcher.  HENT:     Head: Normocephalic and atraumatic.     Right Ear: Tympanic membrane and external ear normal.     Left Ear: Tympanic membrane and external ear normal.     Nose: Nose normal.     Mouth/Throat:     Mouth: Mucous membranes are dry.     Pharynx: Oropharynx is clear.  Eyes:     General: No scleral icterus.       Right eye: No discharge.        Left eye: No discharge.     Extraocular Movements: Extraocular movements intact.     Conjunctiva/sclera: Conjunctivae normal.     Pupils: Pupils are equal, round, and reactive to light.  Neck:     Musculoskeletal: Normal range of motion.     Vascular: No JVD.  Cardiovascular:     Rate and Rhythm: Regular rhythm. Tachycardia present.     Pulses: Normal pulses.          Radial pulses are 2+ on the right side and 2+ on the left side.     Heart sounds: Normal heart sounds.  Pulmonary:     Comments: Lungs clear to auscultation in all fields. Symmetric chest rise. No wheezing, rales, or rhonchi. Abdominal:     Tenderness: There is generalized abdominal tenderness. There is no right CVA tenderness or left CVA tenderness.     Comments: Abdomen is soft, non-distended. No rigidity, no guarding. No peritoneal signs.  Musculoskeletal: Normal range of motion.  Skin:    General: Skin is warm and dry.     Capillary Refill: Capillary refill takes less than 2 seconds.  Neurological:     Mental Status: She is oriented to person, place, and time.     GCS: GCS eye subscore is 4. GCS verbal subscore is 5. GCS motor subscore is 6.     Comments: Fluent speech, no facial droop.  Psychiatric:        Behavior: Behavior normal.      ED Treatments / Results  Labs (all labs ordered are listed, but only abnormal results are displayed) Labs Reviewed  CBC WITH DIFFERENTIAL/PLATELET - Abnormal; Notable for the following components:      Result Value   Platelets  616 (*)    All other components within normal  limits  COMPREHENSIVE METABOLIC PANEL - Abnormal; Notable for the following components:   CO2 20 (*)    Glucose, Bld 271 (*)    Calcium 10.5 (*)    Total Protein 10.3 (*)    Albumin 5.1 (*)    Alkaline Phosphatase 142 (*)    Anion gap 20 (*)    All other components within normal limits  URINALYSIS, ROUTINE W REFLEX MICROSCOPIC - Abnormal; Notable for the following components:   Color, Urine BROWN (*)    APPearance TURBID (*)    Glucose, UA >=500 (*)    Hgb urine dipstick LARGE (*)    Ketones, ur >80 (*)    Protein, ur 100 (*)    Leukocytes,Ua MODERATE (*)    All other components within normal limits  BETA-HYDROXYBUTYRIC ACID - Abnormal; Notable for the following components:   Beta-Hydroxybutyric Acid 2.88 (*)    All other components within normal limits  BLOOD GAS, VENOUS - Abnormal; Notable for the following components:   pO2, Ven <31.0 (*)    Acid-base deficit 3.6 (*)    All other components within normal limits  CBG MONITORING, ED - Abnormal; Notable for the following components:   Glucose-Capillary 215 (*)    All other components within normal limits  SARS CORONAVIRUS 2 (TAT 6-24 HRS)  LIPASE, BLOOD  LACTIC ACID, PLASMA  URINALYSIS, MICROSCOPIC (REFLEX)  BASIC METABOLIC PANEL  BASIC METABOLIC PANEL  BASIC METABOLIC PANEL  BASIC METABOLIC PANEL  I-STAT VENOUS BLOOD GAS, ED  CBG MONITORING, ED    EKG None  Radiology No results found.  Procedures .Critical Care Performed by: Cherre Robins, PA-C Authorized by: Cherre Robins, PA-C   Critical care provider statement:    Critical care time (minutes):  36   Critical care time was exclusive of:  Separately billable procedures and treating other patients and teaching time   Critical care was time spent personally by me on the following activities:  Blood draw for specimens, development of treatment plan with patient or surrogate, discussions with consultants,  evaluation of patient's response to treatment, examination of patient, obtaining history from patient or surrogate, ordering and performing treatments and interventions, ordering and review of laboratory studies, ordering and review of radiographic studies, pulse oximetry, re-evaluation of patient's condition and review of old charts   I assumed direction of critical care for this patient from another provider in my specialty: no     (including critical care time)  Medications Ordered in ED Medications  insulin regular, human (MYXREDLIN) 100 units/ 100 mL infusion (has no administration in time range)  0.9 %  sodium chloride infusion (has no administration in time range)  dextrose 5 %-0.45 % sodium chloride infusion (has no administration in time range)  dextrose 50 % solution 0-50 mL (has no administration in time range)  potassium chloride 10 mEq in 100 mL IVPB (has no administration in time range)  sodium chloride 0.9 % bolus 1,000 mL (0 mLs Intravenous Stopped 12/24/18 1157)  promethazine (PHENERGAN) injection 12.5 mg (12.5 mg Intravenous Given 12/24/18 1023)  HYDROmorphone (DILAUDID) injection 1 mg (1 mg Intravenous Given 12/24/18 1023)  promethazine (PHENERGAN) injection 12.5 mg (12.5 mg Intravenous Given 12/24/18 1157)  HYDROmorphone (DILAUDID) injection 1 mg (1 mg Intravenous Given 12/24/18 1156)  metoCLOPramide (REGLAN) injection 10 mg (10 mg Intravenous Given 12/24/18 1434)     Initial Impression / Assessment and Plan / ED Course  I have reviewed the triage vital signs and the nursing notes.  Pertinent labs & imaging results that were available during my care of the patient were reviewed by me and considered in my medical decision making (see chart for details).  Patient is actively vomiting during exam.  She is afebrile. She looks to be uncomfortable and is thrashing around on the stretcher however is nonseptic appearing.  She was noted to be tachycardic in triage to 130. Mucus  membranes appear dry. She has generalized abdominal tenderness without rebound or guarding.  No peritoneal signs.  CBC without leukocytosis, no anemia. Lipase within normal range. CMP shows bicarb of 20, glucose 271, elevated anion gap of 20. Lactic acid 1.4 within normal range. UA with ketones >80. Beta hydroxybutyric acid is 2.88.   Patient given IVF, IV Phenergan and IV Dilaudid. On reassessment she continues to have pain and vomiting. Tachycardia has improved 107. Labs consistent with ketosis. Findings and plan of care discussed with supervising physician Dr. Billy Fischer who agrees with plan to admit.  Spoke with Dr. Karleen Hampshire with hospitalist service who agrees to assume care of patient and bring into the hospital for further evaluation and management.  She asked for glucose stabilizer to be started. Orders initiated.   Portions of this note were generated with [employer]. Dictation errors may occur despite best attempts at proofreading.   Final Clinical Impressions(s) / ED Diagnoses   Final diagnoses:  Diabetic ketoacidosis without coma associated with type 1 diabetes mellitus [facility])    ED Discharge Orders    None       Cherre Robins, PA-C 12/24/18 1818    Gareth Morgan, MD 12/26/18 1312

## 2018-12-24 NOTE — ED Notes (Signed)
[id number] 

## 2018-12-24 NOTE — H&P (Signed)
History and Physical    [name] QMG:[id number] DOB: 1990/02/05 DOA: 12/24/2018  PCP: Vicenta Aly, FNP  Patient coming from: Home.   I have personally briefly reviewed patient's old medical records in [place]  Chief Complaint: nausea, vomiting and abdominal pain since 1 day.   HPI: [name] is a 28 y.o. female with medical history significant of type 1 diabetes, known compliance to medications, GERD, asthma, diabetic gastroparesis, hypertension, history of intractable cyclical vomiting syndrome, pancreatitis presents to ED with complaints of nausea vomiting since 1 day.  She reports her symptoms were similar to a gastroparesis.  She denies any fevers or chills or diarrhea she denies any chest pain shortness of breath or cough.  ED Course: On arrival to ED be afebrile, tachycardic with heart rate in between 100-1 20s, tachypneic with respiratory rate of 22/min and slightly hypotensive with blood pressure of 95/61 with oxygen sats of 98 200% on room air.  Labs revealed anion gap of 20, CBG of 271, bicarb of 20, alk phos of 142, lipase of 16, lactic acid of 1.4, platelet count of 616, WBC count of 9, hemoglobin of 13.8, beta hydroxybutyrate uric acid of 2.88,.  COVID-19 screening test is negative, urine analysis shows glucose of greater than 500, large hemoglobin, ketones, leukocytosis present.   She was referred to [facility] for admission for acute DKA and was started on glucose stabilizer.  Review of Systems: As per HPI otherwise "All others reviewed and are negative," . No headaches, dizziness or syncope. No dysuria, or hematochezia.   Past Medical History:  Diagnosis Date  . Anxiety   . Arthritis    "hands, feet, knees" (12/18/2016)  . Asthma   . Diabetic gastroparesis ([place])    Per gastric emptying study 07/09/16 which showed significant delayed gastric emptying.  . Gallstones   . Gastroparesis   . GERD (gastroesophageal reflux disease)   . Heart murmur   .  Hepatic steatosis 11/26/2014   and hepatomegaly  . Hypertension    hx (12/18/2016)  . Intractable cyclical vomiting syndrome    Archie Endo 12/18/2016  . Liver mass 11/26/2014  . Pancreatitis, acute 11/26/2014  . Pneumonia    "as a teen X 1" (12/18/2016)  . Type I diabetes mellitus ([place]) 2007   IDDM.  poorly controlled, multiple admits with DKA    Past Surgical History:  Procedure Laterality Date  . CHOLECYSTECTOMY N/A 02/11/2015   Procedure: LAPAROSCOPIC CHOLECYSTECTOMY WITH INTRAOPERATIVE CHOLANGIOGRAM;  Surgeon: Greer Pickerel, MD;  Location: [facility] ORS;  Service: General;  Laterality: N/A;  . ESOPHAGOGASTRODUODENOSCOPY (EGD) WITH PROPOFOL Left 09/20/2014   Procedure: ESOPHAGOGASTRODUODENOSCOPY (EGD) WITH PROPOFOL;  Surgeon: Arta Silence, MD;  Location: [facility]COPY;  Service: Endoscopy;  Laterality: Left;  . WISDOM TOOTH EXTRACTION     Social history:    reports that she has never smoked. She has never used smokeless tobacco. She reports current drug use. Drug: Marijuana. She reports that she does not drink alcohol.  Allergies  Allergen Reactions  . Peanut-Containing Drug Products Swelling and Other (See Comments)    Reaction:  Swelling of mouth and lips   . 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   Reaction:  Swelling of mouth and lips   . Ultram [Tramadol] Itching    Family History  Problem Relation Age of Onset  . Heart disease Maternal Grandmother   . Heart disease Maternal Grandfather   . Diabetes Mother   . Hyperlipidemia Mother   .  Hypertension Father   . Heart disease Father   . Hypertension Paternal Grandmother   . Cancer Paternal Grandfather    Family history reviewed and pertinent.   Prior to Admission medications   Medication Sig Start Date End Date Taking? Authorizing Provider  albuterol (PROVENTIL HFA;VENTOLIN HFA) 108 (90 Base) MCG/ACT inhaler Inhale 1-2 puffs into the lungs every 6 (six) hours as needed for wheezing or  shortness of breath.   Yes [provider]  HYDROcodone-acetaminophen (NORCO/VICODIN) 5-325 MG tablet Take 1 tablet by mouth every 6 (six) hours as needed for moderate pain. 12/21/18  Yes Milton Ferguson, MD  lisinopril (ZESTRIL) 2.5 MG tablet Take 2.5 mg by mouth daily.   Yes [provider]  metoCLOPramide (REGLAN) 10 MG tablet Take 1 tablet (10 mg total) by mouth 3 (three) times daily with meals. 11/18/18 11/18/19 Yes Kathie Dike, MD  naproxen (NAPROSYN) 375 MG tablet Take 1 tablet (375 mg total) by mouth 2 (two) times daily as needed. Patient taking differently: Take 375 mg by mouth 2 (two) times daily as needed for moderate pain.  12/12/18  Yes Albrizze, Kaitlyn E, PA-C  NOVOLOG 100 UNIT/ML injection Inject 50 Units into the skin as directed. Inject up to 50 units/day. 12/08/18  Yes [provider]  ondansetron (ZOFRAN ODT) 4 MG disintegrating tablet Take 1 tablet (4 mg total) by mouth every 8 (eight) hours as needed for nausea or vomiting. 11/18/18  Yes Kathie Dike, MD  promethazine (PHENERGAN) 25 MG tablet Take 25 mg by mouth every 8 (eight) hours as needed for nausea or vomiting.  11/30/18  Yes [provider]  traZODone (DESYREL) 50 MG tablet Take 25-50 mg by mouth at bedtime as needed for sleep.  11/30/18  Yes [provider]  trimethoprim (TRIMPEX) 100 MG tablet Take 100 mg by mouth daily. 11/11/18  Yes [provider]  blood glucose meter kit and supplies Dispense based on patient and insurance preference. Use up to four times daily as directed. (FOR ICD-10 E10.9, E11.9). 12/07/17   Eugenie Filler, MD  glucagon (GLUCAGON EMERGENCY) 1 MG injection Inject 1 mg into the muscle once as needed (For very low blood sugars. May repeat in 15-20 minutes if needed.). 12/20/16   Hongalgi, Lenis Dickinson, MD  Insulin Pen Needle 31G X 5 MM MISC 30 Units by Does not apply route at bedtime. 12/07/17   Eugenie Filler, MD    Physical Exam:  Vitals:   12/24/18 1429 12/24/18 1430 12/24/18 1608 12/24/18 1612  BP:    125/77  Pulse:  (!) 127  (!) 129  Resp:      Temp: 97.8 F (36.6 C)   (!) 97.4 F (36.3 C)  TempSrc: Axillary   Axillary  SpO2:  99%  100%  Weight:   52.9 kg   Height:   5' 3"  (1.6 m)     Constitutional: anxious and in mild distress.  Vitals:   12/24/18 1429 12/24/18 1430 12/24/18 1608 12/24/18 1612  BP:    125/77  Pulse:  (!) 127  (!) 129  Resp:      Temp: 97.8 F (36.6 C)   (!) 97.4 F (36.3 C)  TempSrc: Axillary   Axillary  SpO2:  99%  100%  Weight:   52.9 kg   Height:   5' 3"  (1.6 m)    Eyes: PERRL, lids and conjunctivae normal ENMT: Mucous membranes are dry  Neck: normal, supple, no masses, no thyromegaly Respiratory: clear to auscultation bilaterally,  no wheezing, no crackles. Normal respiratory effort. Cardiovascular: tachycardic. No extremity edema. 2+ pedal pulses. No carotid bruits.  Abdomen: soft, mild gen tenderness.  Musculoskeletal: no clubbing / cyanosis. No joint deformity upper and lower extremities. Good ROM, no contractures. Normal muscle tone.  Skin: no rashes, lesions, ulcers. No induration Neurologic: CN 2-12 grossly intact. Sensation intact, DTR normal. Strength 5/5 in all 4.  Psychiatric: anxious.    Labs on Admission: I have personally reviewed following labs and imaging studies  CBC: Recent Labs  Lab 12/21/18 2004 12/24/18 1022  WBC 11.1* 9.0  NEUTROABS 8.9* 7.2  HGB 12.7 13.8  HCT 40.7 43.1  MCV 88.3 86.0  PLT 562* 537*   Basic Metabolic Panel: Recent Labs  Lab 12/21/18 2004 12/24/18 1022 12/24/18 1657  NA 138 139 138  K 3.9 3.7 5.4*  CL 107 99 105  CO2 17* 20* 15*  GLUCOSE 99 271* 360*  BUN 16 14 15   CREATININE 0.77 0.78 0.99  CALCIUM 10.0 10.5* 9.1   GFR: Estimated Creatinine Clearance: 69.4 mL/min (by C-G formula based on SCr of 0.99 mg/dL). Liver Function Tests: Recent Labs  Lab 12/21/18 2004 12/24/18 1022  AST 16 18  ALT 10 13   ALKPHOS 127* 142*  BILITOT 0.6 0.9  PROT 9.1* 10.3*  ALBUMIN 4.6 5.1*   Recent Labs  Lab 12/21/18 2004 12/24/18 1022  LIPASE 16 16   No results for input(s): AMMONIA in the last 168 hours. Coagulation Profile: No results for input(s): INR, PROTIME in the last 168 hours. Cardiac Enzymes: No results for input(s): CKTOTAL, CKMB, CKMBINDEX, TROPONINI in the last 168 hours. BNP (last 3 results) No results for input(s): PROBNP in the last 8760 hours. HbA1C: No results for input(s): HGBA1C in the last 72 hours. CBG: Recent Labs  Lab 12/21/18 2038 12/24/18 0846 12/24/18 1535 12/24/18 1620 12/24/18 1727  GLUCAP 113* 215* 317* 379* 271*   Lipid Profile: No results for input(s): CHOL, HDL, LDLCALC, TRIG, CHOLHDL, LDLDIRECT in the last 72 hours. Thyroid Function Tests: No results for input(s): TSH, T4TOTAL, FREET4, T3FREE, THYROIDAB in the last 72 hours. Anemia Panel: No results for input(s): VITAMINB12, FOLATE, FERRITIN, TIBC, IRON, RETICCTPCT in the last 72 hours. Urine analysis:    Component Value Date/Time   COLORURINE BROWN (A) 12/24/2018 0922   APPEARANCEUR TURBID (A) 12/24/2018 0922   LABSPEC 1.025 12/24/2018 0922   PHURINE 6.0 12/24/2018 0922   GLUCOSEU >=500 (A) 12/24/2018 0922   GLUCOSEU >=1000 11/07/2012 1205   HGBUR LARGE (A) 12/24/2018 0922   BILIRUBINUR NEGATIVE 12/24/2018 0922   KETONESUR >80 (A) 12/24/2018 0922   PROTEINUR 100 (A) 12/24/2018 0922   UROBILINOGEN 0.2 11/24/2014 1045   NITRITE NEGATIVE 12/24/2018 0922   LEUKOCYTESUR MODERATE (A) 12/24/2018 0922    Radiological Exams on Admission: No results found.  EKG: sinus tachycardia.   Assessment/Plan Active Problems:   DKA (diabetic ketoacidoses) ([place])    DKA;  Admit to SDU for closer monitoring and insulin gtt.  Continue with insulin gtt till GAP is closed and bicarb is wnl.  Her last A1c is 7. 8, last month.  Rule out infection, get CXR and urine cultures.  Keep potassium greater than 4.   Check BMP every 4 hours.   Nausea, vomiting and abdominal pain:  Probably secondary to gastroparesis vs cyclical vomiting syndrome.  Symptomatic management with IV fluids , IV anti emetics and pain control.  Started on clears and advance as tolerated.    GERD:  Start the  patient on PPI.   Mild thrombocytosis Possibly from dehydration.   Asthma: No wheezing heard.     DVT prophylaxis: Lovenox Code Status: Full code Family Communication: None at bedside  disposition Plan: Pending clinical improvement Consults called: None Admission status: Observation   Hosie Poisson MD [facility]  12/24/2018, 5:39 PM

## 2018-12-24 NOTE — ED Notes (Signed)
Asked patient if she is able to give a urine sample and patient say she couldn't right now.

## 2018-12-24 NOTE — Progress Notes (Signed)
Patients Potassium is 5.4 patient currently has a Potassium run infusing, infusion stopped.  Dr. Karleen Hampshire updated on patients current lab, RN directed not to give the two potassium runs that were ordered and to wait for next BMP.

## 2018-12-25 DIAGNOSIS — Z794 Long term (current) use of insulin: Secondary | ICD-10-CM | POA: Diagnosis not present

## 2018-12-25 DIAGNOSIS — I10 Essential (primary) hypertension: Secondary | ICD-10-CM | POA: Diagnosis present

## 2018-12-25 DIAGNOSIS — Z9049 Acquired absence of other specified parts of digestive tract: Secondary | ICD-10-CM | POA: Diagnosis not present

## 2018-12-25 DIAGNOSIS — K219 Gastro-esophageal reflux disease without esophagitis: Secondary | ICD-10-CM | POA: Diagnosis present

## 2018-12-25 DIAGNOSIS — Z20828 Contact with and (suspected) exposure to other viral communicable diseases: Secondary | ICD-10-CM | POA: Diagnosis present

## 2018-12-25 DIAGNOSIS — E876 Hypokalemia: Secondary | ICD-10-CM | POA: Diagnosis not present

## 2018-12-25 DIAGNOSIS — E1043 Type 1 diabetes mellitus with diabetic autonomic (poly)neuropathy: Secondary | ICD-10-CM | POA: Diagnosis present

## 2018-12-25 DIAGNOSIS — Z8249 Family history of ischemic heart disease and other diseases of the circulatory system: Secondary | ICD-10-CM | POA: Diagnosis not present

## 2018-12-25 DIAGNOSIS — Z833 Family history of diabetes mellitus: Secondary | ICD-10-CM | POA: Diagnosis not present

## 2018-12-25 DIAGNOSIS — T85694A Other mechanical complication of insulin pump, initial encounter: Secondary | ICD-10-CM | POA: Diagnosis present

## 2018-12-25 DIAGNOSIS — E1010 Type 1 diabetes mellitus with ketoacidosis without coma: Secondary | ICD-10-CM | POA: Diagnosis present

## 2018-12-25 DIAGNOSIS — J45909 Unspecified asthma, uncomplicated: Secondary | ICD-10-CM | POA: Diagnosis present

## 2018-12-25 DIAGNOSIS — K760 Fatty (change of) liver, not elsewhere classified: Secondary | ICD-10-CM | POA: Diagnosis present

## 2018-12-25 DIAGNOSIS — R112 Nausea with vomiting, unspecified: Secondary | ICD-10-CM | POA: Diagnosis present

## 2018-12-25 DIAGNOSIS — D473 Essential (hemorrhagic) thrombocythemia: Secondary | ICD-10-CM | POA: Diagnosis present

## 2018-12-25 DIAGNOSIS — Z885 Allergy status to narcotic agent status: Secondary | ICD-10-CM | POA: Diagnosis not present

## 2018-12-25 DIAGNOSIS — Z8349 Family history of other endocrine, nutritional and metabolic diseases: Secondary | ICD-10-CM | POA: Diagnosis not present

## 2018-12-25 DIAGNOSIS — Z8719 Personal history of other diseases of the digestive system: Secondary | ICD-10-CM | POA: Diagnosis not present

## 2018-12-25 DIAGNOSIS — K3184 Gastroparesis: Secondary | ICD-10-CM | POA: Diagnosis present

## 2018-12-25 LAB — BASIC METABOLIC PANEL
Anion gap: 11 (ref 5–15)
Anion gap: 14 (ref 5–15)
BUN: 14 mg/dL (ref 6–20)
BUN: 14 mg/dL (ref 6–20)
CO2: 16 mmol/L — ABNORMAL LOW (ref 22–32)
CO2: 18 mmol/L — ABNORMAL LOW (ref 22–32)
Calcium: 9 mg/dL (ref 8.9–10.3)
Calcium: 9 mg/dL (ref 8.9–10.3)
Chloride: 111 mmol/L (ref 98–111)
Chloride: 107 mmol/L (ref 98–111)
Creatinine, Ser: 0.84 mg/dL (ref 0.44–1.00)
Creatinine, Ser: 0.83 mg/dL (ref 0.44–1.00)
GFR calc Af Amer: >60 mL/min (ref >60)
GFR calc Af Amer: >60 mL/min (ref >60)
GFR calc non Af Amer: >60 mL/min (ref >60)
GFR calc non Af Amer: >60 mL/min (ref >60)
Glucose, Bld: 202 mg/dL — ABNORMAL HIGH (ref 70–99)
Glucose, Bld: 189 mg/dL — ABNORMAL HIGH (ref 70–99)
Potassium: 4.3 mmol/L (ref 3.5–5.1)
Potassium: 3.7 mmol/L (ref 3.5–5.1)
Sodium: 138 mmol/L (ref 135–145)
Sodium: 139 mmol/L (ref 135–145)

## 2018-12-25 LAB — GLUCOSE, CAPILLARY
Glucose-Capillary: 171 mg/dL — ABNORMAL HIGH (ref 70–99)
Glucose-Capillary: 134 mg/dL — ABNORMAL HIGH (ref 70–99)
Glucose-Capillary: 142 mg/dL — ABNORMAL HIGH (ref 70–99)
Glucose-Capillary: 153 mg/dL — ABNORMAL HIGH (ref 70–99)
Glucose-Capillary: 166 mg/dL — ABNORMAL HIGH (ref 70–99)
Glucose-Capillary: 170 mg/dL — ABNORMAL HIGH (ref 70–99)
Glucose-Capillary: 171 mg/dL — ABNORMAL HIGH (ref 70–99)
Glucose-Capillary: 179 mg/dL — ABNORMAL HIGH (ref 70–99)
Glucose-Capillary: 188 mg/dL — ABNORMAL HIGH (ref 70–99)
Glucose-Capillary: 199 mg/dL — ABNORMAL HIGH (ref 70–99)
Glucose-Capillary: 260 mg/dL — ABNORMAL HIGH (ref 70–99)
Glucose-Capillary: 197 mg/dL — ABNORMAL HIGH (ref 70–99)

## 2018-12-25 LAB — CBC
HCT: 33 % — ABNORMAL LOW (ref 36.0–46.0)
Hemoglobin: 10.8 g/dL — ABNORMAL LOW (ref 12.0–15.0)
MCH: 27.3 pg (ref 26.0–34.0)
MCHC: 32.7 g/dL (ref 30.0–36.0)
MCV: 83.3 fL (ref 80.0–100.0)
Platelets: 510 10*3/uL — ABNORMAL HIGH (ref 150–400)
RBC: 3.96 MIL/uL (ref 3.87–5.11)
RDW: 13.9 % (ref 11.5–15.5)
WBC: 13 10*3/uL — ABNORMAL HIGH (ref 4.0–10.5)
nRBC: 0 % (ref 0.0–0.2)

## 2018-12-25 MED ORDER — LISINOPRIL 2.5 MG PO TABS
2.5000 mg | ORAL_TABLET | Freq: Every day | ORAL | Status: DC
  Administered 2018-12-26 – 2018-12-29 (×4): 2.5 mg via ORAL
  Filled 2018-12-25 (×4): qty 1

## 2018-12-25 MED ORDER — INSULIN ASPART 100 UNIT/ML ~~LOC~~ SOLN: 0.0000 [IU] | Freq: Every day | SUBCUTANEOUS | Status: DC

## 2018-12-25 MED ORDER — INSULIN ASPART 100 UNIT/ML ~~LOC~~ SOLN
0.0000 [IU] | Freq: Three times a day (TID) | SUBCUTANEOUS | Status: DC
  Administered 2018-12-25: 5 [IU] via SUBCUTANEOUS
  Administered 2018-12-25: 1 [IU] via SUBCUTANEOUS
  Administered 2018-12-26: 5 [IU] via SUBCUTANEOUS

## 2018-12-25 MED ORDER — INSULIN GLARGINE 100 UNIT/ML ~~LOC~~ SOLN
30.0000 [IU] | Freq: Every day | SUBCUTANEOUS | Status: DC
  Administered 2018-12-25: 30 [IU] via SUBCUTANEOUS
  Filled 2018-12-25 (×2): qty 0.3

## 2018-12-25 MED ORDER — SODIUM CHLORIDE 0.9 % IV SOLN
INTRAVENOUS | Status: DC
  Administered 2018-12-25 – 2018-12-28 (×7): via INTRAVENOUS

## 2018-12-25 NOTE — Progress Notes (Signed)
PROGRESS NOTE    [name]  [id number] DOB: 11/17/1990 DOA: 12/24/2018 PCP: Vicenta Aly, FNP   Brief Narrative:  Patient is a 28 year old female with history of type 1 diabetes, GERD, asthma, diarrhea gastroparesis, hypertension, intractable cyclic vomiting syndrome, pancreatitis who presents to the emergency department with complaints of nausea, vomiting.  On arrival she was found to be tachycardic, slightly hypertensive.  Found to have elevated anion gap, hyperglycemic.  She was admitted for the management of DKA.  Started on insulin drip.  Insulin drip has been stopped and she has been started on Lantus today.  She is  still complaining of abdominal pain, nausea so could not be discharged.  Assessment & Plan:   Active Problems:   DKA (diabetic ketoacidoses) ([facility])   DKA/DM1: Started on insulin drip.  It has been stopped and she is now on sliding scale and long-acting insulin.  She follows with endocrinologist.  Currently she is on Levemir 30 units daily with sliding scale.  She was previously on insulin pump which is not functioning at present.  Her endocrinologist is arranging a new with insulin pump for her. Continue IV fluids.  I have requested consult for diabetic coordinator. Hemoglobin A1c was 7.6 on 11/21/2018  Nausea/vomiting/abdominal pain: Secondary to gastroparesis.  Continue IV fluids, antiemetics.  Currently on clear liquid diet  Hypertension: Continue lisinopril  Asthma: Currently stable.  No wheezing.           DVT prophylaxis: Lovenox Code Status: Full Family Communication: None present at the bed side Disposition Plan: Home tomorrow   Consultants: None  Procedures:None  Antimicrobials:  Anti-infectives (From admission, onward)   None      Subjective: Patient seen and examined the bedside this morning.  Hemodynamically stable.  Her insulin drip has been stopped.  She is still complaining of nausea and abdominal pain.  Objective:  Vitals:   12/25/18 0500 12/25/18 0600 12/25/18 0800 12/25/18 0900  BP: 120/84 122/78 104/65 (!) 153/93  Pulse: (!) 106 (!) 108 95 (!) 108  Resp: 20 13 13 16   Temp:      TempSrc:      SpO2: 98% 99% 100% 100%  Weight:      Height:        Intake/Output Summary (Last 24 hours) at 12/25/2018 1039 Last data filed at 12/25/2018 0630 Gross per 24 hour  Intake 1037.22 ml  Output 500 ml  Net 537.22 ml   Filed Weights   12/24/18 1608  Weight: 52.9 kg    Examination:  General exam:Not in distress,average built HEENT:PERRL,Oral mucosa moist, Ear/Nose normal on gross exam Respiratory system: Bilateral equal air entry, normal vesicular breath sounds, no wheezes or crackles  Cardiovascular system: S1 & S2 heard, RRR. No JVD, murmurs, rubs, gallops or clicks. No pedal edema. Gastrointestinal system: Abdomen is nondistended, soft and nontender. No organomegaly or masses felt. Normal bowel sounds heard. Central nervous system: Alert and oriented. No focal neurological deficits. Extremities: No edema, no clubbing ,no cyanosis, distal peripheral pulses palpable. Skin: No rashes, lesions or ulcers,no icterus ,no pallor,tattos    Data Reviewed: I have personally reviewed following labs and imaging studies  CBC: Recent Labs  Lab 12/21/18 2004 12/24/18 1022 12/25/18 0541  WBC 11.1* 9.0 13.0*  NEUTROABS 8.9* 7.2  --   HGB 12.7 13.8 10.8*  HCT 40.7 43.1 33.0*  MCV 88.3 86.0 83.3  PLT 562* 616* [id number]*   Basic Metabolic Panel: Recent Labs  Lab 12/24/18 1022 12/24/18 1657 12/24/18 2106 12/25/18 0045  12/25/18 0541  NA 139 138 141 138 139  K 3.7 5.4* 4.3 4.3 3.7  CL 99 105 111 111 107  CO2 20* 15* 13* 16* 18*  GLUCOSE 271* 360* 224* 202* 189*  BUN 14 15 14 14 14   CREATININE 0.78 0.99 0.83 0.84 0.83  CALCIUM 10.5* 9.1 9.3 9.0 9.0   GFR: Estimated Creatinine Clearance: 82.7 mL/min (by C-G formula based on SCr of 0.83 mg/dL). Liver Function Tests: Recent Labs  Lab 12/21/18 2004  12/24/18 1022  AST 16 18  ALT 10 13  ALKPHOS 127* 142*  BILITOT 0.6 0.9  PROT 9.1* 10.3*  ALBUMIN 4.6 5.1*   Recent Labs  Lab 12/21/18 2004 12/24/18 1022  LIPASE 16 16   No results for input(s): AMMONIA in the last 168 hours. Coagulation Profile: No results for input(s): INR, PROTIME in the last 168 hours. Cardiac Enzymes: No results for input(s): CKTOTAL, CKMB, CKMBINDEX, TROPONINI in the last 168 hours. BNP (last 3 results) No results for input(s): PROBNP in the last 8760 hours. HbA1C: No results for input(s): HGBA1C in the last 72 hours. CBG: Recent Labs  Lab 12/24/18 2027 12/24/18 2136 12/24/18 2247 12/24/18 2351 12/25/18 0340  GLUCAP 209* 195* 187* 177* 171*   Lipid Profile: No results for input(s): CHOL, HDL, LDLCALC, TRIG, CHOLHDL, LDLDIRECT in the last 72 hours. Thyroid Function Tests: No results for input(s): TSH, T4TOTAL, FREET4, T3FREE, THYROIDAB in the last 72 hours. Anemia Panel: No results for input(s): VITAMINB12, FOLATE, FERRITIN, TIBC, IRON, RETICCTPCT in the last 72 hours. Sepsis Labs: Recent Labs  Lab 12/24/18 1125  LATICACIDVEN 1.4    Recent Results (from the past 240 hour(s))  SARS CORONAVIRUS 2 (TAT 6-24 HRS) Nasopharyngeal Nasopharyngeal Swab     Status: None   Collection Time: 12/24/18  2:18 PM   Specimen: Nasopharyngeal Swab  Result Value Ref Range Status   SARS Coronavirus 2 NEGATIVE NEGATIVE Final    Comment: (NOTE) SARS-CoV-2 target nucleic acids are NOT DETECTED. The SARS-CoV-2 RNA is generally detectable in upper and lower respiratory specimens during the acute phase of infection. Negative results do not preclude SARS-CoV-2 infection, do not rule out co-infections with other pathogens, and should not be used as the sole basis for treatment or other patient management decisions. Negative results must be combined with clinical observations, patient history, and epidemiological information. The expected result is Negative. Fact  Sheet for Patients: SugarRoll.be Fact Sheet for Healthcare Providers: https://www.woods-mathews.com/ This test is not yet approved or cleared by the [place] FDA and  has been authorized for detection and/or diagnosis of SARS-CoV-2 by FDA under an Emergency Use Authorization (EUA). This EUA will remain  in effect (meaning this test can be used) for the duration of the COVID-19 declaration under Section 56 4(b)(1) of the Act, 21 U.S.C. section 360bbb-3(b)(1), unless the authorization is terminated or revoked sooner. Performed at [facility], [place] [address] [place], [place] [address]   MRSA PCR Screening     Status: None   Collection Time: 12/24/18  4:13 PM   Specimen: Nasal Mucosa; Nasopharyngeal  Result Value Ref Range Status   MRSA by PCR NEGATIVE NEGATIVE Final    Comment:        The GeneXpert MRSA Assay (FDA approved for NASAL specimens only), is one component of a comprehensive MRSA colonization surveillance program. It is not intended to diagnose MRSA infection nor to guide or monitor treatment for MRSA infections. Performed at [facility], [address] [place], [place] [address]          Radiology Studies: Dg Chest Port 1 View  Result Date: 12/24/2018 CLINICAL DATA:  Emesis since 430 this morning with abdominal pain, history of gastric paresis. EXAM: PORTABLE CHEST 1 VIEW COMPARISON:  11/14/2018 FINDINGS: The heart size and mediastinal contours are within normal limits. Both lungs are clear. The visualized skeletal structures are unremarkable. IMPRESSION: No acute cardiopulmonary disease. Electronically Signed   By: Zetta Bills M.D.   On: 12/24/2018 18:40        Scheduled Meds: . Chlorhexidine Gluconate Cloth  6 each Topical Daily  . enoxaparin (LOVENOX) injection  40 mg Subcutaneous Q24H  . insulin aspart  0-5 Units Subcutaneous QHS  . insulin aspart  0-9 Units Subcutaneous TID WC   . insulin glargine  30 Units Subcutaneous Daily  . mouth rinse  15 mL Mouth Rinse BID  . metoCLOPramide (REGLAN) injection  10 mg Intravenous Q6H   Continuous Infusions: . sodium chloride 75 mL/hr at 12/24/18 1744     LOS: 0 days    Time spent: 25 mins.More than 50% of that time was spent in counseling and/or coordination of care.      Shelly Coss, MD [facility] Pager [phone]  If 7PM-7AM, please contact night-coverage [email or web address] Password [email or web address] 12/25/2018, 10:39 AM

## 2018-12-26 LAB — BASIC METABOLIC PANEL
Anion gap: 15 (ref 5–15)
Anion gap: 16 — ABNORMAL HIGH (ref 5–15)
Anion gap: 13 (ref 5–15)
Anion gap: 11 (ref 5–15)
BUN: 11 mg/dL (ref 6–20)
BUN: 10 mg/dL (ref 6–20)
BUN: 10 mg/dL (ref 6–20)
BUN: 9 mg/dL (ref 6–20)
CO2: 15 mmol/L — ABNORMAL LOW (ref 22–32)
CO2: 16 mmol/L — ABNORMAL LOW (ref 22–32)
CO2: 16 mmol/L — ABNORMAL LOW (ref 22–32)
CO2: 20 mmol/L — ABNORMAL LOW (ref 22–32)
Calcium: 8.6 mg/dL — ABNORMAL LOW (ref 8.9–10.3)
Calcium: 8.9 mg/dL (ref 8.9–10.3)
Calcium: 8.8 mg/dL — ABNORMAL LOW (ref 8.9–10.3)
Calcium: 8.7 mg/dL — ABNORMAL LOW (ref 8.9–10.3)
Chloride: 106 mmol/L (ref 98–111)
Chloride: 105 mmol/L (ref 98–111)
Chloride: 108 mmol/L (ref 98–111)
Chloride: 106 mmol/L (ref 98–111)
Creatinine, Ser: 0.69 mg/dL (ref 0.44–1.00)
Creatinine, Ser: 0.72 mg/dL (ref 0.44–1.00)
Creatinine, Ser: 0.64 mg/dL (ref 0.44–1.00)
Creatinine, Ser: 0.62 mg/dL (ref 0.44–1.00)
GFR calc Af Amer: >60 mL/min (ref >60)
GFR calc Af Amer: >60 mL/min (ref >60)
GFR calc Af Amer: >60 mL/min (ref >60)
GFR calc Af Amer: >60 mL/min (ref >60)
GFR calc non Af Amer: >60 mL/min (ref >60)
GFR calc non Af Amer: >60 mL/min (ref >60)
GFR calc non Af Amer: >60 mL/min (ref >60)
GFR calc non Af Amer: >60 mL/min (ref >60)
Glucose, Bld: 248 mg/dL — ABNORMAL HIGH (ref 70–99)
Glucose, Bld: 276 mg/dL — ABNORMAL HIGH (ref 70–99)
Glucose, Bld: 170 mg/dL — ABNORMAL HIGH (ref 70–99)
Glucose, Bld: 180 mg/dL — ABNORMAL HIGH (ref 70–99)
Potassium: 4 mmol/L (ref 3.5–5.1)
Potassium: 3.8 mmol/L (ref 3.5–5.1)
Potassium: 3.8 mmol/L (ref 3.5–5.1)
Potassium: 3.9 mmol/L (ref 3.5–5.1)
Sodium: 136 mmol/L (ref 135–145)
Sodium: 137 mmol/L (ref 135–145)
Sodium: 137 mmol/L (ref 135–145)
Sodium: 137 mmol/L (ref 135–145)

## 2018-12-26 LAB — URINE CULTURE: Culture: >=100000 — AB

## 2018-12-26 LAB — GLUCOSE, CAPILLARY
Glucose-Capillary: 140 mg/dL — ABNORMAL HIGH (ref 70–99)
Glucose-Capillary: 148 mg/dL — ABNORMAL HIGH (ref 70–99)
Glucose-Capillary: 156 mg/dL — ABNORMAL HIGH (ref 70–99)
Glucose-Capillary: 160 mg/dL — ABNORMAL HIGH (ref 70–99)
Glucose-Capillary: 160 mg/dL — ABNORMAL HIGH (ref 70–99)
Glucose-Capillary: 163 mg/dL — ABNORMAL HIGH (ref 70–99)
Glucose-Capillary: 174 mg/dL — ABNORMAL HIGH (ref 70–99)
Glucose-Capillary: 181 mg/dL — ABNORMAL HIGH (ref 70–99)
Glucose-Capillary: 194 mg/dL — ABNORMAL HIGH (ref 70–99)
Glucose-Capillary: 244 mg/dL — ABNORMAL HIGH (ref 70–99)
Glucose-Capillary: 262 mg/dL — ABNORMAL HIGH (ref 70–99)

## 2018-12-26 MED ORDER — POTASSIUM CHLORIDE 10 MEQ/100ML IV SOLN
10.0000 meq | INTRAVENOUS | Status: AC
  Administered 2018-12-26 (×4): 10 meq via INTRAVENOUS
  Filled 2018-12-26 (×4): qty 100

## 2018-12-26 MED ORDER — SODIUM CHLORIDE 0.9 % IV SOLN: INTRAVENOUS | Status: DC

## 2018-12-26 MED ORDER — INSULIN REGULAR(HUMAN) IN NACL 100-0.9 UT/100ML-% IV SOLN
INTRAVENOUS | Status: DC
  Administered 2018-12-26: 5 [IU]/h via INTRAVENOUS
  Filled 2018-12-26: qty 100

## 2018-12-26 MED ORDER — INSULIN GLARGINE 100 UNIT/ML ~~LOC~~ SOLN
28.0000 [IU] | Freq: Every day | SUBCUTANEOUS | Status: DC
  Administered 2018-12-26 – 2018-12-29 (×4): 28 [IU] via SUBCUTANEOUS
  Filled 2018-12-26 (×4): qty 0.28

## 2018-12-26 MED ORDER — DEXTROSE-NACL 5-0.45 % IV SOLN
INTRAVENOUS | Status: DC
  Administered 2018-12-26: 75 mL/h via INTRAVENOUS

## 2018-12-26 MED ORDER — INSULIN ASPART 100 UNIT/ML ~~LOC~~ SOLN
0.0000 [IU] | SUBCUTANEOUS | Status: DC
  Administered 2018-12-26 – 2018-12-27 (×3): 1 [IU] via SUBCUTANEOUS
  Administered 2018-12-27 – 2018-12-28 (×3): 2 [IU] via SUBCUTANEOUS
  Administered 2018-12-28: 1 [IU] via SUBCUTANEOUS
  Administered 2018-12-28 – 2018-12-29 (×2): 2 [IU] via SUBCUTANEOUS

## 2018-12-26 MED ORDER — DEXTROSE 50 % IV SOLN
0.0000 mL | INTRAVENOUS | Status: DC | PRN
  Administered 2018-12-27: 25 mL via INTRAVENOUS
  Filled 2018-12-26: qty 50

## 2018-12-26 MED ORDER — INSULIN ASPART 100 UNIT/ML ~~LOC~~ SOLN: 0.0000 [IU] | SUBCUTANEOUS | Status: DC

## 2018-12-26 NOTE — Progress Notes (Signed)
Dr. Tawanna Solo made aware of patiens most recent BMET. Verbal orders were given to stop D51/2NS gtt two hours after Lantus has been given and then put patient back on the 182ml/hr of NS. Will report off to oncoming nurse of MD's orders and place a nursing note in orders

## 2018-12-26 NOTE — Progress Notes (Signed)
PROGRESS NOTE    [name]  [id number] DOB: Nov 09, 1990 DOA: 12/24/2018 PCP: Vicenta Aly, FNP   Brief Narrative:  Patient is a 28 year old female with history of type 1 diabetes, GERD, asthma, diarrhea gastroparesis, hypertension, intractable cyclic vomiting syndrome, pancreatitis who presents to the emergency department with complaints of nausea, vomiting.  On arrival she was found to be tachycardic, slightly hypertensive.  Found to have elevated anion gap, hyperglycemic.  She was admitted for the management of DKA.  Started on insulin drip.  Insulin drip was stopped and she has been started on Lantus today.  She is  still complaining of abdominal pain, nausea .  CO2 decreased.  She remains tachycardic.  Restarted insulin drip.   Assessment & Plan:   Active Problems:   DKA (diabetic ketoacidoses) (HCC)   DKA/DM1: Started on insulin drip.  It has been stopped and she is now on sliding scale and long-acting insulin.  She follows with endocrinologist.  Currently she is on Levemir 30 units daily with sliding scale.  She was previously on insulin pump which is not functioning at present.  Her endocrinologist is arranging a new with insulin pump for her. Continue IV fluids.  I have requested consult for diabetic coordinator. Hemoglobin A1c was 7.6 on 11/21/2018 She is  still complaining of abdominal pain, nausea .  CO2 decreased.  She remains tachycardic.  Restarted insulin drip.  Nausea/vomiting/abdominal pain: Secondary to gastroparesis.  Continue IV fluids, antiemetics.  Currently on clear liquid diet  Hypertension: Continue lisinopril  Asthma: Currently stable.  No wheezing.           DVT prophylaxis: Lovenox Code Status: Full Family Communication: None present at the bed side Disposition Plan: Home after clinical improvement    Consultants: None  Procedures:None  Antimicrobials:  Anti-infectives (From admission, onward)   None      Subjective:  Patient seen and examined the bedside this morning.  Complaining of increased nausea, abdominal pain.  She might be back in DKA.  Restarted insulin drip.  Objective: Vitals:   12/26/18 0700 12/26/18 0800 12/26/18 0908 12/26/18 1000  BP: (!) 146/101 (!) 144/94 (!) 165/97 (!) 141/79  Pulse: (!) 120 (!) 117  (!) 119  Resp: (!) 23 (!) 23  17  Temp:  98.7 F (37.1 C)    TempSrc:  Oral    SpO2: 100% 100%  98%  Weight:      Height:        Intake/Output Summary (Last 24 hours) at 12/26/2018 1138 Last data filed at 12/26/2018 1022 Gross per 24 hour  Intake 2347.08 ml  Output 1750 ml  Net 597.08 ml   Filed Weights   12/24/18 1608  Weight: 52.9 kg    Examination:  General exam:Not in distress,average built HEENT:PERRL,Oral mucosa moist, Ear/Nose normal on gross exam Respiratory system: Bilateral equal air entry, normal vesicular breath sounds, no wheezes or crackles  Cardiovascular system: S1 & S2 heard, RRR. No JVD, murmurs, rubs, gallops or clicks. No pedal edema. Gastrointestinal system: Abdomen is nondistended, soft and nontender. No organomegaly or masses felt. Normal bowel sounds heard. Central nervous system: Alert and oriented. No focal neurological deficits. Extremities: No edema, no clubbing ,no cyanosis, distal peripheral pulses palpable. Skin: No rashes, lesions or ulcers,no icterus ,no pallor,tattos    Data Reviewed: I have personally reviewed following labs and imaging studies  CBC: Recent Labs  Lab 12/21/18 2004 12/24/18 1022 12/25/18 0541  WBC 11.1* 9.0 13.0*  NEUTROABS 8.9* 7.2  --  HGB 12.7 13.8 10.8*  HCT 40.7 43.1 33.0*  MCV 88.3 86.0 83.3  PLT 562* 616* 99991111*   Basic Metabolic Panel: Recent Labs  Lab 12/24/18 2106 12/25/18 0045 12/25/18 0541 12/26/18 0203 12/26/18 0900  NA 141 138 139 136 137  K 4.3 4.3 3.7 4.0 3.8  CL 111 111 107 106 105  CO2 13* 16* 18* 15* 16*  GLUCOSE 224* 202* 189* 248* 276*  BUN 14 14 14 11 10   CREATININE 0.83 0.84  0.83 0.69 0.72  CALCIUM 9.3 9.0 9.0 8.6* 8.9   GFR: Estimated Creatinine Clearance: 85.8 mL/min (by C-G formula based on SCr of 0.72 mg/dL). Liver Function Tests: Recent Labs  Lab 12/21/18 2004 12/24/18 1022  AST 16 18  ALT 10 13  ALKPHOS 127* 142*  BILITOT 0.6 0.9  PROT 9.1* 10.3*  ALBUMIN 4.6 5.1*   Recent Labs  Lab 12/21/18 2004 12/24/18 1022  LIPASE 16 16   No results for input(s): AMMONIA in the last 168 hours. Coagulation Profile: No results for input(s): INR, PROTIME in the last 168 hours. Cardiac Enzymes: No results for input(s): CKTOTAL, CKMB, CKMBINDEX, TROPONINI in the last 168 hours. BNP (last 3 results) No results for input(s): PROBNP in the last 8760 hours. HbA1C: No results for input(s): HGBA1C in the last 72 hours. CBG: Recent Labs  Lab 12/25/18 1627 12/25/18 2126 12/26/18 0814 12/26/18 0857 12/26/18 1019  GLUCAP 260* 197* 262* 244* 194*   Lipid Profile: No results for input(s): CHOL, HDL, LDLCALC, TRIG, CHOLHDL, LDLDIRECT in the last 72 hours. Thyroid Function Tests: No results for input(s): TSH, T4TOTAL, FREET4, T3FREE, THYROIDAB in the last 72 hours. Anemia Panel: No results for input(s): VITAMINB12, FOLATE, FERRITIN, TIBC, IRON, RETICCTPCT in the last 72 hours. Sepsis Labs: Recent Labs  Lab 12/24/18 1125  LATICACIDVEN 1.4    Recent Results (from the past 240 hour(s))  Culture, Urine     Status: Abnormal   Collection Time: 12/24/18  1:09 PM   Specimen: Urine, Random  Result Value Ref Range Status   Specimen Description   Final    URINE, RANDOM Performed at [facility] [address], [place], [place] [address]    Special Requests   Final    NONE Performed at [facility] [place] [address], [place], [place] [address]    Culture >=100,000 COLONIES/mL YEAST (A)  Final   Report Status 12/26/2018 FINAL  Final  SARS CORONAVIRUS 2 (TAT 6-24 HRS) Nasopharyngeal Nasopharyngeal Swab     Status: None    Collection Time: 12/24/18  2:18 PM   Specimen: Nasopharyngeal Swab  Result Value Ref Range Status   SARS Coronavirus 2 NEGATIVE NEGATIVE Final    Comment: (NOTE) SARS-CoV-2 target nucleic acids are NOT DETECTED. The SARS-CoV-2 RNA is generally detectable in upper and lower respiratory specimens during the acute phase of infection. Negative results do not preclude SARS-CoV-2 infection, do not rule out co-infections with other pathogens, and should not be used as the sole basis for treatment or other patient management decisions. Negative results must be combined with clinical observations, patient history, and epidemiological information. The expected result is Negative. Fact Sheet for Patients: SugarRoll.be Fact Sheet for Healthcare Providers: https://www.woods-mathews.com/ This test is not yet approved or cleared by the [place] FDA and  has been authorized for detection and/or diagnosis of SARS-CoV-2 by FDA under an Emergency Use Authorization (EUA). This EUA will remain  in effect (meaning this test can be used) for the duration  of the COVID-19 declaration under Section 56 4(b)(1) of the Act, 21 U.S.C. section 360bbb-3(b)(1), unless the authorization is terminated or revoked sooner. Performed at [facility], [place] [address], [place], [place] [address]   MRSA PCR Screening     Status: None   Collection Time: 12/24/18  4:13 PM   Specimen: Nasal Mucosa; Nasopharyngeal  Result Value Ref Range Status   MRSA by PCR NEGATIVE NEGATIVE Final    Comment:        The GeneXpert MRSA Assay (FDA approved for NASAL specimens only), is one component of a comprehensive MRSA colonization surveillance program. It is not intended to diagnose MRSA infection nor to guide or monitor treatment for MRSA infections. Performed at [facility], [place] [address] [place], [place] [address]          Radiology Studies: Dg  Chest Port 1 View  Result Date: 12/24/2018 CLINICAL DATA:  Emesis since 430 this morning with abdominal pain, history of gastric paresis. EXAM: PORTABLE CHEST 1 VIEW COMPARISON:  11/14/2018 FINDINGS: The heart size and mediastinal contours are within normal limits. Both lungs are clear. The visualized skeletal structures are unremarkable. IMPRESSION: No acute cardiopulmonary disease. Electronically Signed   By: Zetta Bills M.D.   On: 12/24/2018 18:40        Scheduled Meds: . Chlorhexidine Gluconate Cloth  6 each Topical Daily  . enoxaparin (LOVENOX) injection  40 mg Subcutaneous Q24H  . lisinopril  2.5 mg Oral Daily  . mouth rinse  15 mL Mouth Rinse BID  . metoCLOPramide (REGLAN) injection  10 mg Intravenous Q6H   Continuous Infusions: . sodium chloride Stopped (12/26/18 0926)  . dextrose 5 % and 0.45% NaCl 75 mL/hr at 12/26/18 1022  . insulin 2.8 mL/hr at 12/26/18 1022  . potassium chloride 10 mEq (12/26/18 1125)     LOS: 1 day    Time spent: 25 mins.More than 50% of that time was spent in counseling and/or coordination of care.      Shelly Coss, MD [facility] Pager [phone]  If 7PM-7AM, please contact night-coverage [email or web address] Password [facility] 12/26/2018, 11:38 AM

## 2018-12-26 NOTE — TOC Initial Note (Addendum)
Transition of Care [facility]) - Initial/Assessment Note    Patient Details  Name: Burnis Bressette MRN: [id number] Date of Birth: 07/17/1990  [facility] CM/SW Contact:    Nila Nephew, LCSW Phone Number: 12/26/2018, 10:20 AM  Clinical Narrative:         Completed high readmission risk screening due to score 42%.          Activities of Daily Living Home Assistive Devices/Equipment: CBG Meter ADL Screening (condition at time of admission) Patient's cognitive ability adequate to safely complete daily activities?: Yes Is the patient deaf or have difficulty hearing?: No Does the patient have difficulty seeing, even when wearing glasses/contacts?: No Does the patient have difficulty concentrating, remembering, or making decisions?: No Patient able to express need for assistance with ADLs?: Yes Does the patient have difficulty dressing or bathing?: No Independently performs ADLs?: Yes (appropriate for developmental age) Does the patient have difficulty walking or climbing stairs?: No Weakness of Legs: None Weakness of Arms/Hands: None   Admission diagnosis:  Diabetic ketoacidosis without coma associated with type 1 diabetes mellitus ([place]) [E10.10] DKA (diabetic ketoacidoses) ([place]) [E11.10] Patient Active Problem List   Diagnosis Date Noted  . Gastroparesis due to secondary diabetes ([place]) 11/21/2018  . Diabetes mellitus ([place]) 11/13/2018  . HTN (hypertension) 11/13/2018  . Marijuana abuse 11/13/2018  . Acute pyelonephritis 10/07/2018  . Hydronephrosis of right kidney 10/05/2018  . Diffuse pain   . Elevated blood pressure reading 12/03/2017  . Tachycardia 11/30/2017  . Hypernatremia 11/02/2017  . Nausea and vomiting 01/22/2017  . Nausea & vomiting 01/21/2017  . Intractable cyclical vomiting syndrome 12/18/2016  . Leukocytosis 10/24/2016  . N&V (nausea and vomiting) 10/23/2016  . DKA, type 1 ([place]) 09/26/2016  . Thrombocytosis ([place]) 07/08/2016  . Candiduria,  asymptomatic 06/23/2016  . Hyponatremia 06/13/2016  . Hematuria 05/13/2016  . UTI (urinary tract infection) 01/22/2016  . Diarrhea 11/09/2015  . Acute urinary retention   . Gastroparesis due to DM ([place]) 07/10/2015  . GERD (gastroesophageal reflux disease) 07/10/2015  . Depression with anxiety 07/10/2015  . Gastroparesis 06/22/2015  . Altered mental status 06/22/2015  . Volume depletion 06/10/2015  . Protein-calorie malnutrition, severe 06/10/2015  . Hyperglycemia   . Elevated bilirubin   . Hematemesis with nausea   . Intractable nausea and vomiting 05/28/2015  . Abdominal pain in female   . Abdominal pain 05/24/2015  . Hypertension 05/24/2015  . Dehydration   . Diabetic gastroparesis ([place])   . Chronic diastolic heart failure ([place]) 04/11/2015  . Hematemesis 04/08/2015  . DKA (diabetic ketoacidoses) ([place]) 03/22/2015  . S/P laparoscopic cholecystectomy 02/11/2015  . Postextubation stridor   . Pancreatitis, acute 11/26/2014  . Volume overload 11/26/2014  . Hypokalemia 11/26/2014  . Hepatic steatosis 11/26/2014  . Liver mass 11/26/2014  . Sepsis ([place]) 11/25/2014  . Sinus tachycardia 11/25/2014  . Hypomagnesemia 11/25/2014  . Hypophosphatemia 11/25/2014  . Elevated LFTs 11/24/2014  . AKI (acute kidney injury) ([place]) 11/24/2014  . Migraine headache 11/24/2014  . Asthma 06/29/2012  . Uncontrolled type 1 diabetes mellitus ([place]) 06/19/2010  . Goiter, unspecified 06/19/2010   PCP:  Vicenta Aly, [place] Pharmacy:   [id number] [place], [place] [address] [place] [address] Phone: [phone] Fax: [phone]     Readmission Risk Interventions Readmission Risk Prevention Plan 12/26/2018 11/14/2018  Transportation Screening Complete Complete  Medication Review (RN Care Manager) Complete Complete  PCP or Specialist appointment within 3-5 days of discharge Complete Not  Complete  PCP/Specialist Appt Not Complete comments - not  ready for dc  HRI or Home Care Consult Not Complete Complete  HRI or Home Care Consult Pt Refusal Comments n/a -  SW Recovery Care/Counseling Consult Not Complete Complete  SW Consult Not Complete Comments pending need -  Palliative Care Screening Not Complete Not Applicable  Comments pending need -  [place] Not Applicable Not Applicable  Some recent data might be hidden

## 2018-12-26 NOTE — Progress Notes (Signed)
Inpatient Diabetes Program Recommendations  AACE/ADA: New Consensus Statement on Inpatient Glycemic Control (2015)  Target Ranges:  Prepandial:   less than 140 mg/dL      Peak postprandial:   less than 180 mg/dL (1-2 hours)      Critically ill patients:  140 - 180 mg/dL   Lab Results  Component Value Date   GLUCAP 163 (H) 12/26/2018   HGBA1C 7.6 (H) 11/21/2018    Review of Glycemic Control  Diabetes history: DM1 Outpatient Diabetes medications: Insulin pump Current orders for Inpatient glycemic control: IV insulin per [facility] - stable with glucose checks Q2H.  Spoke with pt who states pump has been malfunctioning. States she is "starting the process" of ordering a new one.  Will need to be discharged on Lantus and Novolog.  Endocrinologist: D. Francetta Found with [facility]--Last seen January 2020 Insulin Pump Settings Basal Rate: 1.4 units/hr Total Basal per 24 hours= 33.6 Carbohydrate Ratio= 1 unit for every 10 grams Carbs Correction Factor= 1 unit for every 66 mg/dl above Target CBG  Inpatient Diabetes Program Recommendations:     For transitioning to SQ insulin:  Lantus 28 units Q24H (give 2 hours prior to discontinuation of drip) Novolog 0-9 units Q4H x 12H, then tidwc and hs If eating will need meal coverage - 4-6 units tidwc.  Continue with insulin drip until CO2 > 20. AG is 13.  CO2 is 16.   Will follow.  Thank you. Lorenda Peck, RD, LDN, CDE Inpatient Diabetes Coordinator [phone]

## 2018-12-27 LAB — BASIC METABOLIC PANEL
Anion gap: 11 (ref 5–15)
Anion gap: 7 (ref 5–15)
BUN: 7 mg/dL (ref 6–20)
BUN: 7 mg/dL (ref 6–20)
CO2: 18 mmol/L — ABNORMAL LOW (ref 22–32)
CO2: 22 mmol/L (ref 22–32)
Calcium: 8.7 mg/dL — ABNORMAL LOW (ref 8.9–10.3)
Calcium: 8.4 mg/dL — ABNORMAL LOW (ref 8.9–10.3)
Chloride: 110 mmol/L (ref 98–111)
Chloride: 110 mmol/L (ref 98–111)
Creatinine, Ser: 0.62 mg/dL (ref 0.44–1.00)
Creatinine, Ser: 0.57 mg/dL (ref 0.44–1.00)
GFR calc Af Amer: >60 mL/min (ref >60)
GFR calc Af Amer: >60 mL/min (ref >60)
GFR calc non Af Amer: >60 mL/min (ref >60)
GFR calc non Af Amer: >60 mL/min (ref >60)
Glucose, Bld: 143 mg/dL — ABNORMAL HIGH (ref 70–99)
Glucose, Bld: 128 mg/dL — ABNORMAL HIGH (ref 70–99)
Potassium: 3.5 mmol/L (ref 3.5–5.1)
Potassium: 3.1 mmol/L — ABNORMAL LOW (ref 3.5–5.1)
Sodium: 139 mmol/L (ref 135–145)
Sodium: 139 mmol/L (ref 135–145)

## 2018-12-27 LAB — GLUCOSE, CAPILLARY
Glucose-Capillary: 111 mg/dL — ABNORMAL HIGH (ref 70–99)
Glucose-Capillary: 142 mg/dL — ABNORMAL HIGH (ref 70–99)
Glucose-Capillary: 151 mg/dL — ABNORMAL HIGH (ref 70–99)
Glucose-Capillary: 87 mg/dL (ref 70–99)
Glucose-Capillary: 118 mg/dL — ABNORMAL HIGH (ref 70–99)
Glucose-Capillary: 65 mg/dL — ABNORMAL LOW (ref 70–99)
Glucose-Capillary: 115 mg/dL — ABNORMAL HIGH (ref 70–99)

## 2018-12-27 MED ORDER — MORPHINE SULFATE (PF) 2 MG/ML IV SOLN
2.0000 mg | INTRAVENOUS | Status: DC | PRN
  Administered 2018-12-27 – 2018-12-28 (×6): 2 mg via INTRAVENOUS
  Filled 2018-12-27 (×6): qty 1

## 2018-12-27 MED ORDER — LABETALOL HCL 5 MG/ML IV SOLN
5.0000 mg | Freq: Four times a day (QID) | INTRAVENOUS | Status: DC | PRN
  Administered 2018-12-27 – 2018-12-28 (×2): 5 mg via INTRAVENOUS
  Filled 2018-12-27 (×2): qty 4

## 2018-12-27 NOTE — Progress Notes (Signed)
Patient walked with nursing tech around half of unit. Heartrate increased to 135, O2 100% on room air. Will continue to monitor.

## 2018-12-27 NOTE — Progress Notes (Signed)
   12/27/18 2141  Vitals  BP (!) 163/111  MAP (mmHg) 123  Pulse Rate (!) 106  ECG Heart Rate (!) 106  Resp (!) 9  Oxygen Therapy  SpO2 97 %  MEWS Score  MEWS RR 1  MEWS Pulse 1  MEWS Systolic 0  MEWS LOC 0  MEWS Temp 1  MEWS Score 3  MEWS Score Color Yellow   Provider made aware of change in VS. Pt asleep and in no acute distress. Awaiting orders

## 2018-12-27 NOTE — Progress Notes (Signed)
PROGRESS NOTE    [name]  [id number] DOB: Aug 12, 1990 DOA: 12/24/2018 PCP: Vicenta Aly, FNP   Brief Narrative:  Patient is a 28 year old female with history of type 1 diabetes, GERD, asthma, diarrhea gastroparesis, hypertension, intractable cyclic vomiting syndrome, pancreatitis who presents to the emergency department with complaints of nausea, vomiting.  On arrival she was found to be tachycardic, slightly hypertensive.  Found to have elevated anion gap, hyperglycemic.  She was admitted for the management of DKA.  Started on insulin drip.  Insulin drip was stopped and she has been started on Lantus .  She is  still complaining of abdominal pain, nausea .   Assessment & Plan:   Active Problems:   DKA (diabetic ketoacidoses) (HCC)   DKA/DM1: Started on insulin drip.  It has been stopped and she is now on sliding scale and long-acting insulin.  She follows with endocrinologist.  Currently she is on Levemir 30 units daily with sliding scale.  She was previously on insulin pump which is not functioning at present.  Her endocrinologist is arranging a new with insulin pump for her. Continue IV fluids.  I have requested consult for diabetic coordinator. Hemoglobin A1c was 7.6 on 11/21/2018 She is  still complaining of abdominal pain, nausea . She remains tachycardic.  We will check BMP later today.  Nausea/vomiting/abdominal pain: Secondary to gastroparesis.  Continue IV fluids, antiemetics.On reglan.  Currently on clear liquid diet  Hypertension: Continue lisinopril  Asthma: Currently stable.  No wheezing.           DVT prophylaxis: Lovenox Code Status: Full Family Communication: None present at the bed side Disposition Plan: Home after clinical improvement    Consultants: None  Procedures:None  Antimicrobials:  Anti-infectives (From admission, onward)   None      Subjective: Patient seen and examined at bedside this morning.  Hemodynamically stable but  in sinus tachycardia.  Complains of vomiting and abdominal pain again today.  Discharge canceled.  Objective: Vitals:   12/27/18 0800 12/27/18 0810 12/27/18 1000 12/27/18 1210  BP: 117/73  (!) 158/117   Pulse: (!) 102  (!) 129   Resp: 18  (!) 21   Temp:  98.5 F (36.9 C)  98.2 F (36.8 C)  TempSrc:  Oral  Oral  SpO2: 98%  100%   Weight:      Height:        Intake/Output Summary (Last 24 hours) at 12/27/2018 1339 Last data filed at 12/27/2018 0600 Gross per 24 hour  Intake 2213.89 ml  Output 1050 ml  Net 1163.89 ml   Filed Weights   12/24/18 1608  Weight: 52.9 kg    Examination:  General exam: Uncomfortable due to abdomen pain, nausea,average built HEENT:PERRL,Oral mucosa moist, Ear/Nose normal on gross exam Respiratory system: Bilateral equal air entry, normal vesicular breath sounds, no wheezes or crackles  Cardiovascular system: Sinus tachycardia. No JVD, murmurs, rubs, gallops or clicks. No pedal edema. Gastrointestinal system: Abdomen is nondistended, soft and nontender. No organomegaly or masses felt. Normal bowel sounds heard. Central nervous system: Alert and oriented. No focal neurological deficits. Extremities: No edema, no clubbing ,no cyanosis, distal peripheral pulses palpable. Skin: No rashes, lesions or ulcers,no icterus ,no pallor,tattos    Data Reviewed: I have personally reviewed following labs and imaging studies  CBC: Recent Labs  Lab 12/21/18 2004 12/24/18 1022 12/25/18 0541  WBC 11.1* 9.0 13.0*  NEUTROABS 8.9* 7.2  --   HGB 12.7 13.8 10.8*  HCT 40.7 43.1 33.0*  MCV 88.3 86.0 83.3  PLT 562* 616* [id number]*   Basic Metabolic Panel: Recent Labs  Lab 12/26/18 0203 12/26/18 0900 12/26/18 1320 12/26/18 1712 12/27/18 0253  NA 136 137 137 137 139  K 4.0 3.8 3.8 3.9 3.5  CL 106 105 108 106 110  CO2 15* 16* 16* 20* 18*  GLUCOSE 248* 276* 170* 180* 143*  BUN 11 10 10 9 7   CREATININE 0.69 0.72 0.64 0.62 0.62  CALCIUM 8.6* 8.9 8.8* 8.7* 8.7*    GFR: Estimated Creatinine Clearance: 85.8 mL/min (by C-G formula based on SCr of 0.62 mg/dL). Liver Function Tests: Recent Labs  Lab 12/21/18 2004 12/24/18 1022  AST 16 18  ALT 10 13  ALKPHOS 127* 142*  BILITOT 0.6 0.9  PROT 9.1* 10.3*  ALBUMIN 4.6 5.1*   Recent Labs  Lab 12/21/18 2004 12/24/18 1022  LIPASE 16 16   No results for input(s): AMMONIA in the last 168 hours. Coagulation Profile: No results for input(s): INR, PROTIME in the last 168 hours. Cardiac Enzymes: No results for input(s): CKTOTAL, CKMB, CKMBINDEX, TROPONINI in the last 168 hours. BNP (last 3 results) No results for input(s): PROBNP in the last 8760 hours. HbA1C: No results for input(s): HGBA1C in the last 72 hours. CBG: Recent Labs  Lab 12/26/18 2042 12/26/18 2348 12/27/18 0304 12/27/18 0819 12/27/18 1216  GLUCAP 148* 140* 111* 142* 151*   Lipid Profile: No results for input(s): CHOL, HDL, LDLCALC, TRIG, CHOLHDL, LDLDIRECT in the last 72 hours. Thyroid Function Tests: No results for input(s): TSH, T4TOTAL, FREET4, T3FREE, THYROIDAB in the last 72 hours. Anemia Panel: No results for input(s): VITAMINB12, FOLATE, FERRITIN, TIBC, IRON, RETICCTPCT in the last 72 hours. Sepsis Labs: Recent Labs  Lab 12/24/18 1125  LATICACIDVEN 1.4    Recent Results (from the past 240 hour(s))  Culture, Urine     Status: Abnormal   Collection Time: 12/24/18  1:09 PM   Specimen: Urine, Random  Result Value Ref Range Status   Specimen Description   Final    URINE, RANDOM Performed at [facility] [address] [place], [place] [address]    Special Requests   Final    NONE Performed at [facility], [place] [address], [place], [place] [address]    Culture >=100,000 COLONIES/mL YEAST (A)  Final   Report Status 12/26/2018 FINAL  Final  SARS CORONAVIRUS 2 (TAT 6-24 HRS) Nasopharyngeal Nasopharyngeal Swab     Status: None   Collection Time: 12/24/18  2:18 PM   Specimen:  Nasopharyngeal Swab  Result Value Ref Range Status   SARS Coronavirus 2 NEGATIVE NEGATIVE Final    Comment: (NOTE) SARS-CoV-2 target nucleic acids are NOT DETECTED. The SARS-CoV-2 RNA is generally detectable in upper and lower respiratory specimens during the acute phase of infection. Negative results do not preclude SARS-CoV-2 infection, do not rule out co-infections with other pathogens, and should not be used as the sole basis for treatment or other patient management decisions. Negative results must be combined with clinical observations, patient history, and epidemiological information. The expected result is Negative. Fact Sheet for Patients: SugarRoll.be Fact Sheet for Healthcare Providers: https://www.woods-mathews.com/ This test is not yet approved or cleared by the [place] FDA and  has been authorized for detection and/or diagnosis of SARS-CoV-2 by FDA under an Emergency Use Authorization (EUA). This EUA will remain  in effect (meaning this test can be used) for the duration of the COVID-19 declaration under Section 56 4(b)(1) of the  Act, 21 U.S.C. section 360bbb-3(b)(1), unless the authorization is terminated or revoked sooner. Performed at [facility], [place] [address] [place], [place] [address]   MRSA PCR Screening     Status: None   Collection Time: 12/24/18  4:13 PM   Specimen: Nasal Mucosa; Nasopharyngeal  Result Value Ref Range Status   MRSA by PCR NEGATIVE NEGATIVE Final    Comment:        The GeneXpert MRSA Assay (FDA approved for NASAL specimens only), is one component of a comprehensive MRSA colonization surveillance program. It is not intended to diagnose MRSA infection nor to guide or monitor treatment for MRSA infections. Performed at [facility], [place] [address] [place], [place] [address]          Radiology Studies: No results found.      Scheduled Meds: .  Chlorhexidine Gluconate Cloth  6 each Topical Daily  . enoxaparin (LOVENOX) injection  40 mg Subcutaneous Q24H  . insulin aspart  0-9 Units Subcutaneous Q4H  . insulin glargine  28 Units Subcutaneous Daily  . lisinopril  2.5 mg Oral Daily  . mouth rinse  15 mL Mouth Rinse BID  . metoCLOPramide (REGLAN) injection  10 mg Intravenous Q6H   Continuous Infusions: . sodium chloride 125 mL/hr at 12/27/18 0919     LOS: 2 days    Time spent: 25 mins.More than 50% of that time was spent in counseling and/or coordination of care.      Shelly Coss, MD [facility] Pager [phone]  If 7PM-7AM, please contact night-coverage [email or web address] Password [email or web address] 12/27/2018, 1:39 PM

## 2018-12-28 ENCOUNTER — Inpatient Hospital Stay (HOSPITAL_COMMUNITY): Payer: BC Managed Care – PPO

## 2018-12-28 LAB — BASIC METABOLIC PANEL
Anion gap: 14 (ref 5–15)
BUN: 5 mg/dL — ABNORMAL LOW (ref 6–20)
CO2: 19 mmol/L — ABNORMAL LOW (ref 22–32)
Calcium: 8.6 mg/dL — ABNORMAL LOW (ref 8.9–10.3)
Chloride: 107 mmol/L (ref 98–111)
Creatinine, Ser: 0.63 mg/dL (ref 0.44–1.00)
GFR calc Af Amer: >60 mL/min (ref >60)
GFR calc non Af Amer: >60 mL/min (ref >60)
Glucose, Bld: 130 mg/dL — ABNORMAL HIGH (ref 70–99)
Potassium: 2.9 mmol/L — ABNORMAL LOW (ref 3.5–5.1)
Sodium: 140 mmol/L (ref 135–145)

## 2018-12-28 LAB — GLUCOSE, CAPILLARY
Glucose-Capillary: 111 mg/dL — ABNORMAL HIGH (ref 70–99)
Glucose-Capillary: 154 mg/dL — ABNORMAL HIGH (ref 70–99)
Glucose-Capillary: 126 mg/dL — ABNORMAL HIGH (ref 70–99)
Glucose-Capillary: 100 mg/dL — ABNORMAL HIGH (ref 70–99)
Glucose-Capillary: 157 mg/dL — ABNORMAL HIGH (ref 70–99)
Glucose-Capillary: 155 mg/dL — ABNORMAL HIGH (ref 70–99)

## 2018-12-28 LAB — HCG, QUANTITATIVE, PREGNANCY: hCG, Beta Chain, Quant, S: <1 m[IU]/mL (ref <5)

## 2018-12-28 LAB — MAGNESIUM: Magnesium: 1.5 mg/dL — ABNORMAL LOW (ref 1.7–2.4)

## 2018-12-28 MED ORDER — MAGNESIUM SULFATE 2 GM/50ML IV SOLN
2.0000 g | Freq: Once | INTRAVENOUS | Status: AC
  Administered 2018-12-28: 2 g via INTRAVENOUS
  Filled 2018-12-28: qty 50

## 2018-12-28 MED ORDER — POTASSIUM CHLORIDE 20 MEQ PO PACK
40.0000 meq | PACK | Freq: Once | ORAL | Status: AC
  Administered 2018-12-28: 40 meq via ORAL
  Filled 2018-12-28: qty 2

## 2018-12-28 MED ORDER — SODIUM CHLORIDE 0.9 % IV SOLN
INTRAVENOUS | Status: DC | PRN
  Administered 2018-12-28: 250 mL via INTRAVENOUS

## 2018-12-28 MED ORDER — IOHEXOL 300 MG/ML  SOLN
100.0000 mL | Freq: Once | INTRAMUSCULAR | Status: AC | PRN
  Administered 2018-12-28: 80 mL via INTRAVENOUS

## 2018-12-28 MED ORDER — IOHEXOL 9 MG/ML PO SOLN: 500.0000 mL | ORAL | Status: AC

## 2018-12-28 MED ORDER — HYDROMORPHONE HCL 1 MG/ML IJ SOLN
0.5000 mg | INTRAMUSCULAR | Status: DC | PRN
  Administered 2018-12-28 (×4): 0.5 mg via INTRAVENOUS
  Filled 2018-12-28 (×4): qty 1

## 2018-12-28 MED ORDER — IOHEXOL 9 MG/ML PO SOLN
ORAL | Status: AC
  Filled 2018-12-28: qty 500

## 2018-12-28 MED ORDER — ONDANSETRON HCL 4 MG/2ML IJ SOLN
4.0000 mg | Freq: Four times a day (QID) | INTRAMUSCULAR | Status: DC | PRN
  Administered 2018-12-28 (×2): 4 mg via INTRAVENOUS
  Filled 2018-12-28 (×2): qty 2

## 2018-12-28 MED ORDER — POTASSIUM CHLORIDE 10 MEQ/100ML IV SOLN
10.0000 meq | INTRAVENOUS | Status: AC
  Administered 2018-12-28 (×5): 10 meq via INTRAVENOUS
  Filled 2018-12-28 (×5): qty 100

## 2018-12-28 NOTE — Progress Notes (Signed)
PROGRESS NOTE    [name]  [id number] DOB: 1990/05/15 DOA: 12/24/2018 PCP: Vicenta Aly, FNP   Brief Narrative:  Patient is a 28 year old female with history of type 1 diabetes, GERD, asthma, diarrhea gastroparesis, hypertension, intractable cyclic vomiting syndrome, pancreatitis who presents to the emergency department with complaints of nausea, vomiting.  On arrival she was found to be tachycardic, slightly hypertensive.  Found to have elevated anion gap, hyperglycemic.  She was admitted for the management of DKA.  Started on insulin drip.  Insulin drip was stopped and she has been started on Lantus .  She is  still complaining of abdominal pain, nausea and has vomiting.   Assessment & Plan:   Active Problems:   DKA (diabetic ketoacidoses) (HCC)   DKA/DM1: Started on insulin drip.  It has been stopped and she is now on sliding scale and long-acting insulin.  She follows with endocrinologist.  Currently she is on Levemir 30 units daily with sliding scale.  She was previously on insulin pump which is not functioning at present.  Her endocrinologist is arranging a new with insulin pump for her. Continue IV fluids.  I have requested consult for diabetic coordinator. Hemoglobin A1c was 7.6 on 11/21/2018 She is  still complaining of abdominal pain, nausea and vomiting . She remains tachycardic.    Nausea/vomiting/abdominal pain: Secondary to gastroparesis.  Continue IV fluids, antiemetics.On reglan.  Will check CT abd/pelvis.  Hypertension: Continue lisinopril  Asthma: Currently stable.  No wheezing.  Hypokalemia: We will supplement.  Check magnesium           DVT prophylaxis: Lovenox Code Status: Full Family Communication: None present at the bed side Disposition Plan: Home after clinical improvement    Consultants: None  Procedures:None  Antimicrobials:  Anti-infectives (From admission, onward)   None      Subjective: Patient seen and examined the  bedside this morning.  This morning she looked okay and states she feels more comfortable.  I was notified by RN that she again had abdominal pain and started vomiting.  Objective: Vitals:   12/28/18 0327 12/28/18 0500 12/28/18 0855 12/28/18 1000  BP:  (!) 140/93  (!) 150/110  Pulse:  (!) 104  (!) 114  Resp:  (!) 22  17  Temp: 99.3 F (37.4 C)  98.4 F (36.9 C)   TempSrc: Oral  Oral   SpO2:  98%  99%  Weight:      Height:        Intake/Output Summary (Last 24 hours) at 12/28/2018 1045 Last data filed at 12/28/2018 [id number] Gross per 24 hour  Intake 3712.51 ml  Output 350 ml  Net 3362.51 ml   Filed Weights   12/24/18 1608  Weight: 52.9 kg    Examination:  General exam: Not in distress HEENT:PERRL,Oral mucosa moist, Ear/Nose normal on gross exam Respiratory system: Bilateral equal air entry, normal vesicular breath sounds, no wheezes or crackles  Cardiovascular system: Sinus tachycardia. No JVD, murmurs, rubs, gallops or clicks. No pedal edema. Gastrointestinal system: Abdomen is nondistended, soft and nontender. No organomegaly or masses felt. Normal bowel sounds heard. Central nervous system: Alert and oriented. No focal neurological deficits. Extremities: No edema, no clubbing ,no cyanosis, distal peripheral pulses palpable. Skin: No rashes, lesions or ulcers,no icterus ,no pallor,tattos    Data Reviewed: I have personally reviewed following labs and imaging studies  CBC: Recent Labs  Lab 12/21/18 2004 12/24/18 1022 12/25/18 0541  WBC 11.1* 9.0 13.0*  NEUTROABS 8.9* 7.2  --  HGB 12.7 13.8 10.8*  HCT 40.7 43.1 33.0*  MCV 88.3 86.0 83.3  PLT 562* 616* [id number]*   Basic Metabolic Panel: Recent Labs  Lab 12/26/18 1320 12/26/18 1712 12/27/18 0253 12/27/18 1446 12/28/18 0156  NA 137 137 139 139 140  K 3.8 3.9 3.5 3.1* 2.9*  CL 108 106 110 110 107  CO2 16* 20* 18* 22 19*  GLUCOSE 170* 180* 143* 128* 130*  BUN 10 9 7 7  5*  CREATININE 0.64 0.62 0.62 0.57 0.63   CALCIUM 8.8* 8.7* 8.7* 8.4* 8.6*   GFR: Estimated Creatinine Clearance: 85.8 mL/min (by C-G formula based on SCr of 0.63 mg/dL). Liver Function Tests: Recent Labs  Lab 12/21/18 2004 12/24/18 1022  AST 16 18  ALT 10 13  ALKPHOS 127* 142*  BILITOT 0.6 0.9  PROT 9.1* 10.3*  ALBUMIN 4.6 5.1*   Recent Labs  Lab 12/21/18 2004 12/24/18 1022  LIPASE 16 16   No results for input(s): AMMONIA in the last 168 hours. Coagulation Profile: No results for input(s): INR, PROTIME in the last 168 hours. Cardiac Enzymes: No results for input(s): CKTOTAL, CKMB, CKMBINDEX, TROPONINI in the last 168 hours. BNP (last 3 results) No results for input(s): PROBNP in the last 8760 hours. HbA1C: No results for input(s): HGBA1C in the last 72 hours. CBG: Recent Labs  Lab 12/27/18 1934 12/27/18 2023 12/27/18 2322 12/28/18 0325 12/28/18 0809  GLUCAP 65* 118* 115* 111* 154*   Lipid Profile: No results for input(s): CHOL, HDL, LDLCALC, TRIG, CHOLHDL, LDLDIRECT in the last 72 hours. Thyroid Function Tests: No results for input(s): TSH, T4TOTAL, FREET4, T3FREE, THYROIDAB in the last 72 hours. Anemia Panel: No results for input(s): VITAMINB12, FOLATE, FERRITIN, TIBC, IRON, RETICCTPCT in the last 72 hours. Sepsis Labs: Recent Labs  Lab 12/24/18 1125  LATICACIDVEN 1.4    Recent Results (from the past 240 hour(s))  Culture, Urine     Status: Abnormal   Collection Time: 12/24/18  1:09 PM   Specimen: Urine, Random  Result Value Ref Range Status   Specimen Description   Final    URINE, RANDOM Performed at [facility] [address], [place], [place] [address]    Special Requests   Final    NONE Performed at [facility], [place] [address], [place], [place] [address]    Culture >=100,000 COLONIES/mL YEAST (A)  Final   Report Status 12/26/2018 FINAL  Final  SARS CORONAVIRUS 2 (TAT 6-24 HRS) Nasopharyngeal Nasopharyngeal Swab     Status: None   Collection  Time: 12/24/18  2:18 PM   Specimen: Nasopharyngeal Swab  Result Value Ref Range Status   SARS Coronavirus 2 NEGATIVE NEGATIVE Final    Comment: (NOTE) SARS-CoV-2 target nucleic acids are NOT DETECTED. The SARS-CoV-2 RNA is generally detectable in upper and lower respiratory specimens during the acute phase of infection. Negative results do not preclude SARS-CoV-2 infection, do not rule out co-infections with other pathogens, and should not be used as the sole basis for treatment or other patient management decisions. Negative results must be combined with clinical observations, patient history, and epidemiological information. The expected result is Negative. Fact Sheet for Patients: SugarRoll.be Fact Sheet for Healthcare Providers: https://www.woods-mathews.com/ This test is not yet approved or cleared by the [place] FDA and  has been authorized for detection and/or diagnosis of SARS-CoV-2 by FDA under an Emergency Use Authorization (EUA). This EUA will remain  in effect (meaning this test can be used) for the duration  of the COVID-19 declaration under Section 56 4(b)(1) of the Act, 21 U.S.C. section 360bbb-3(b)(1), unless the authorization is terminated or revoked sooner. Performed at [facility], [place] [address], [place], [place] [address]   MRSA PCR Screening     Status: None   Collection Time: 12/24/18  4:13 PM   Specimen: Nasal Mucosa; Nasopharyngeal  Result Value Ref Range Status   MRSA by PCR NEGATIVE NEGATIVE Final    Comment:        The GeneXpert MRSA Assay (FDA approved for NASAL specimens only), is one component of a comprehensive MRSA colonization surveillance program. It is not intended to diagnose MRSA infection nor to guide or monitor treatment for MRSA infections. Performed at [facility], [place] [address] [place], [place] [address]          Radiology Studies: No results found.       Scheduled Meds: . Chlorhexidine Gluconate Cloth  6 each Topical Daily  . enoxaparin (LOVENOX) injection  40 mg Subcutaneous Q24H  . insulin aspart  0-9 Units Subcutaneous Q4H  . insulin glargine  28 Units Subcutaneous Daily  . lisinopril  2.5 mg Oral Daily  . mouth rinse  15 mL Mouth Rinse BID  . metoCLOPramide (REGLAN) injection  10 mg Intravenous Q6H   Continuous Infusions: . sodium chloride 125 mL/hr at 12/28/18 0901  . sodium chloride 250 mL (12/28/18 0904)  . potassium chloride 10 mEq (12/28/18 0956)     LOS: 3 days    Time spent: 25 mins.More than 50% of that time was spent in counseling and/or coordination of care.      Shelly Coss, MD [facility] Pager [phone]  If 7PM-7AM, please contact night-coverage [email or web address] Password [email or web address] 12/28/2018, 10:45 AM

## 2018-12-29 LAB — CBC
HCT: 34.8 % — ABNORMAL LOW (ref 36.0–46.0)
Hemoglobin: 10.9 g/dL — ABNORMAL LOW (ref 12.0–15.0)
MCH: 26.9 pg (ref 26.0–34.0)
MCHC: 31.3 g/dL (ref 30.0–36.0)
MCV: 85.9 fL (ref 80.0–100.0)
Platelets: 498 10*3/uL — ABNORMAL HIGH (ref 150–400)
RBC: 4.05 MIL/uL (ref 3.87–5.11)
RDW: 13.8 % (ref 11.5–15.5)
WBC: 7.8 10*3/uL (ref 4.0–10.5)
nRBC: 0 % (ref 0.0–0.2)

## 2018-12-29 LAB — URINALYSIS, ROUTINE W REFLEX MICROSCOPIC
Bilirubin Urine: NEGATIVE
Glucose, UA: NEGATIVE mg/dL
Ketones, ur: 20 mg/dL — AB
Nitrite: NEGATIVE
Protein, ur: 100 mg/dL — AB
RBC / HPF: >50 RBC/hpf — ABNORMAL HIGH (ref 0–5)
Specific Gravity, Urine: 1.029 (ref 1.005–1.030)
WBC, UA: >50 WBC/hpf — ABNORMAL HIGH (ref 0–5)
pH: 6 (ref 5.0–8.0)

## 2018-12-29 LAB — BASIC METABOLIC PANEL
Anion gap: 8 (ref 5–15)
BUN: <5 mg/dL — ABNORMAL LOW (ref 6–20)
CO2: 23 mmol/L (ref 22–32)
Calcium: 8.4 mg/dL — ABNORMAL LOW (ref 8.9–10.3)
Chloride: 107 mmol/L (ref 98–111)
Creatinine, Ser: 0.53 mg/dL (ref 0.44–1.00)
GFR calc Af Amer: >60 mL/min (ref >60)
GFR calc non Af Amer: >60 mL/min (ref >60)
Glucose, Bld: 196 mg/dL — ABNORMAL HIGH (ref 70–99)
Potassium: 3 mmol/L — ABNORMAL LOW (ref 3.5–5.1)
Sodium: 138 mmol/L (ref 135–145)

## 2018-12-29 LAB — GLUCOSE, CAPILLARY
Glucose-Capillary: 186 mg/dL — ABNORMAL HIGH (ref 70–99)
Glucose-Capillary: 77 mg/dL (ref 70–99)
Glucose-Capillary: 116 mg/dL — ABNORMAL HIGH (ref 70–99)

## 2018-12-29 LAB — HEPARIN ANTI-XA: Heparin LMW: 0.19 IU/mL

## 2018-12-29 LAB — MAGNESIUM: Magnesium: 1.8 mg/dL (ref 1.7–2.4)

## 2018-12-29 MED ORDER — POTASSIUM CHLORIDE 20 MEQ PO PACK
40.0000 meq | PACK | ORAL | Status: AC
  Administered 2018-12-29 (×2): 40 meq via ORAL
  Filled 2018-12-29 (×2): qty 2

## 2018-12-29 MED ORDER — POTASSIUM CHLORIDE ER 20 MEQ PO TBCR: 20.0000 meq | EXTENDED_RELEASE_TABLET | Freq: Every day | ORAL | 0 refills | Status: DC

## 2018-12-29 NOTE — Discharge Summary (Signed)
Physician Discharge Summary  [name] TDV:[id number] DOB: 01-05-1991 DOA: 12/24/2018  PCP: Vicenta Aly, FNP  Admit date: 12/24/2018 Discharge date: 12/29/2018  Admitted From: Home Disposition:  Home  Discharge Condition:Stable CODE STATUS:FULL Diet recommendation: Carb Modified    Brief/Interim Summary:  Patient is a 28 year old female with history of type 1 diabetes, GERD, asthma, diarrhea gastroparesis, hypertension, intractable cyclic vomiting syndrome, pancreatitis who presents to the emergency department with complaints of nausea, vomiting.  On arrival she was found to be tachycardic, slightly hypertensive.  Found to have elevated anion gap, hyperglycemic.  She was admitted for the management of DKA.  Started on insulin drip.  Insulin drip was stopped and she has been started on Lantus .  Hospital course remarkable with persistent abdominal pain, nausea and has vomiting.  CT abdomen/pelvis did not show any acute intra-abdominal abnormalities.  Her nausea vomiting and abdominal pain have resolved today.  She is hemodynamically stable for discharge home today.  She has been currently taking Levemir 30 units a day along with sliding scale and she has enough  supplies at home.  She is follow-up with her endocrinologist soon.  Following problems were addressed during her hospitalization:  DKA/DM1: Started on insulin drip.  It has been stopped and she is now on sliding scale and long-acting insulin.  She follows with endocrinologist.  Currently she is on Levemir 30 units daily with sliding scale.  She was previously on insulin pump which is not functioning at present.  Her endocrinologist is arranging a new with insulin pump for her. Hemoglobin A1c was 7.6 on 11/21/2018 No abdominal pain, nausea and vomiting today.  Hemodynamically stable   Nausea/vomiting/abdominal pain: Secondary to gastroparesis. On reglan.   CT abd/pelvis did not show any acute intra-abdominal abnormalities. CT  abdomen/pelvis showed possible pyelonephritis but she did not have any dysuria, no fever or leukocytosis costovertebral angle tenderness so antibiotics not indicated.  Urine culture had grown Candida.  Hypertension: Continue lisinopril  Asthma: Currently stable.  No wheezing.  Hypokalemia: Continue supplementation.    Discharge Diagnoses:  Active Problems:   DKA (diabetic ketoacidoses) [facility])    Discharge Instructions  Discharge Instructions    Diet - low sodium heart healthy   Complete by: As directed    Discharge instructions   Complete by: As directed    1)Please continue insulin at home.Monitor your blood sugars. 2)Follow up with your endocrinologist in a week.   Increase activity slowly   Complete by: As directed      Allergies as of 12/29/2018      Reactions   Peanut-containing Drug Products Swelling, Other (See Comments)   Reaction:  Swelling of mouth and lips    Food Swelling, Other (See Comments)   Pt is allergic to strawberries.   Reaction:  Swelling of mouth and lips    Ultram [tramadol] Itching      Medication List    TAKE these medications   albuterol 108 (90 Base) MCG/ACT inhaler Commonly known as: VENTOLIN HFA Inhale 1-2 puffs into the lungs every 6 (six) hours as needed for wheezing or shortness of breath.   blood glucose meter kit and supplies Dispense based on patient and insurance preference. Use up to four times daily as directed. (FOR ICD-10 E10.9, E11.9).   glucagon 1 MG injection Commonly known as: Glucagon Emergency Inject 1 mg into the muscle once as needed (For very low blood sugars. May repeat in 15-20 minutes if needed.).   HYDROcodone-acetaminophen 5-325 MG tablet Commonly known as:  NORCO/VICODIN Take 1 tablet by mouth every 6 (six) hours as needed for moderate pain.   Insulin Pen Needle 31G X 5 MM Misc 30 Units by Does not apply route at bedtime.   lisinopril 2.5 MG tablet Commonly known as: ZESTRIL Take 2.5 mg by mouth  daily.   metoCLOPramide 10 MG tablet Commonly known as: REGLAN Take 1 tablet (10 mg total) by mouth 3 (three) times daily with meals.   naproxen 375 MG tablet Commonly known as: NAPROSYN Take 1 tablet (375 mg total) by mouth 2 (two) times daily as needed. What changed: reasons to take this   NovoLOG 100 UNIT/ML injection Generic drug: insulin aspart Inject 50 Units into the skin as directed. Inject up to 50 units/day.   ondansetron 4 MG disintegrating tablet Commonly known as: Zofran ODT Take 1 tablet (4 mg total) by mouth every 8 (eight) hours as needed for nausea or vomiting.   Potassium Chloride ER 20 MEQ Tbcr Take 20 mEq by mouth daily for 5 days. Start taking on: December 30, 2018   promethazine 25 MG tablet Commonly known as: PHENERGAN Take 25 mg by mouth every 8 (eight) hours as needed for nausea or vomiting.   traZODone 50 MG tablet Commonly known as: DESYREL Take 25-50 mg by mouth at bedtime as needed for sleep.   trimethoprim 100 MG tablet Commonly known as: TRIMPEX Take 100 mg by mouth daily.      Follow-up Information    Vicenta Aly, [place]. Schedule an appointment as soon as possible for a visit in 1 week(s).   Specialty: Nurse Practitioner Contact information: [place] [id number] [phone]          Allergies  Allergen Reactions  . Peanut-Containing Drug Products Swelling and Other (See Comments)    Reaction:  Swelling of mouth and lips   . 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   Reaction:  Swelling of mouth and lips   . Ultram [Tramadol] Itching    Consultations:  None   Procedures/Studies: CT ABDOMEN PELVIS W CONTRAST  Result Date: 12/28/2018 CLINICAL DATA:  Nausea and vomiting, DKA EXAM: CT ABDOMEN AND PELVIS WITH CONTRAST TECHNIQUE: Multidetector CT imaging of the abdomen and pelvis was performed using the standard protocol following bolus administration of  intravenous contrast. CONTRAST:  70m OMNIPAQUE IOHEXOL 300 MG/ML  SOLN COMPARISON:  10/04/2018 CT, 11/07/2018 ultrasound of the kidneys FINDINGS: Lower chest: No acute abnormality. Hepatobiliary: No focal liver abnormality is seen. Status post cholecystectomy. No biliary dilatation. Pancreas: Unremarkable. No pancreatic ductal dilatation or surrounding inflammatory changes. Spleen: Normal in size without focal abnormality. Adrenals/Urinary Tract: Adrenal glands are within normal limits. The left kidney demonstrates a normal enhancement pattern. No renal calculi or obstructive changes are seen. The bladder is well distended with wall thickening slightly increased when compare with the prior exam. Correlate for underlying UTI. The right kidney demonstrates evidence of hydronephrosis and prominent extrarenal pelvis with hydroureter. No definitive obstructing stone is noted. The overall appearance has progressed somewhat in the interval from the prior CT examination. The degree of dilatation has worsened in the interval from a recent ultrasound examination from October of 2020. enhancement pattern in the right kidney demonstrates some focal decreased attenuation in the upper lobe medially slightly more prominent than that seen on the prior exam suspicious for underlying pyelonephritis. Stomach/Bowel: The appendix is not well visualized. No inflammatory changes are seen to suggest appendicitis. Small  sliding-type hiatal hernia is noted. Stomach is otherwise within normal limits. Fluid-filled loops of small bowel are seen although no obstructive changes are noted. Vascular/Lymphatic: No significant vascular findings are present. No enlarged abdominal or pelvic lymph nodes. Reproductive: The uterus demonstrates an enhancing lesion posteriorly likely representing a small fibroid. No adnexal mass is seen. Other: No abdominal wall hernia or abnormality. No abdominopelvic ascites. Musculoskeletal: No acute or significant  osseous findings. IMPRESSION: Right-sided hydronephrosis similar to that seen on prior CT examination likely chronic in nature related to bladder wall thickening and a distended bladder. No obstructing stone or lesion is seen. The insertion site on the bladder may be low and contribute to these findings. Patchy enhancement pattern in the right kidney somewhat worsened when compared with the prior exam. This raises suspicion for underlying pyelonephritis. Correlation with urinalysis and white blood cell count is recommended. Small uterine fibroid. Electronically Signed   By: Inez Catalina M.D.   On: 12/28/2018 18:45   DG CHEST PORT 1 VIEW  Result Date: 12/24/2018 CLINICAL DATA:  Emesis since 430 this morning with abdominal pain, history of gastric paresis. EXAM: PORTABLE CHEST 1 VIEW COMPARISON:  11/14/2018 FINDINGS: The heart size and mediastinal contours are within normal limits. Both lungs are clear. The visualized skeletal structures are unremarkable. IMPRESSION: No acute cardiopulmonary disease. Electronically Signed   By: Zetta Bills M.D.   On: 12/24/2018 18:40   Acute Abd Series  Result Date: 12/21/2018 CLINICAL DATA:  Vomiting EXAM: DG ABDOMEN ACUTE W/ 1V CHEST COMPARISON:  10/11/2018 FINDINGS: Prior cholecystectomy. The bowel gas pattern is normal. There is no evidence of free intraperitoneal air. No suspicious radio-opaque calculi or other significant radiographic abnormality is seen. Heart size and mediastinal contours are within normal limits. Both lungs are clear. IMPRESSION: Negative abdominal radiographs.  No acute cardiopulmonary disease. Electronically Signed   By: Rolm Baptise M.D.   On: 12/21/2018 21:29   UE Venous Duplex ([facility] and [facility] ONLY)  Result Date: 12/12/2018 UPPER VENOUS STUDY  Indications: Swelling Limitations: Small caliber vessels and body habitus. Comparison Study: 11/15/2018 age indeterminate left cephalic vein thrombus. Performing Technologist: Maudry Mayhew MHA,  RDMS, RVT, RDCS  Examination Guidelines: A complete evaluation includes B-mode imaging, spectral Doppler, color Doppler, and power Doppler as needed of all accessible portions of each vessel. Bilateral testing is considered an integral part of a complete examination. Limited examinations for reoccurring indications may be performed as noted.  Right Findings: +----------+------------+---------+-----------+----------+-------+ RIGHT     CompressiblePhasicitySpontaneousPropertiesSummary +----------+------------+---------+-----------+----------+-------+ Subclavian               Yes       Yes                      +----------+------------+---------+-----------+----------+-------+  Left Findings: +----------+------------+---------+-----------+----------+-----------------+ LEFT      CompressiblePhasicitySpontaneousProperties     Summary      +----------+------------+---------+-----------+----------+-----------------+ IJV           Full       Yes       Yes                                +----------+------------+---------+-----------+----------+-----------------+ Subclavian    Full       Yes       Yes                                +----------+------------+---------+-----------+----------+-----------------+  Axillary      Full       Yes       Yes                                +----------+------------+---------+-----------+----------+-----------------+ Brachial      Full       Yes       Yes                                +----------+------------+---------+-----------+----------+-----------------+ Radial        Full                                                    +----------+------------+---------+-----------+----------+-----------------+ Ulnar         Full                                                    +----------+------------+---------+-----------+----------+-----------------+ Cephalic      None                                  Age Indeterminate  +----------+------------+---------+-----------+----------+-----------------+ Basilic       Full                                                    +----------+------------+---------+-----------+----------+-----------------+  Summary:  Right: No evidence of thrombosis in the subclavian.  Left: No evidence of deep vein thrombosis in the upper extremity. Findings consistent with age indeterminate superficial vein thrombosis involving the left cephalic vein. No significant change when compared to prior study.  *See table(s) above for measurements and observations.  Diagnosing physician: Curt Jews MD Electronically signed by Curt Jews MD on 12/12/2018 at 1:31:11 PM.    Final        Subjective: Patient seen and examined the bedside this morning.  Hemodynamically stable for  discharge.  Discharge Exam: Vitals:   12/29/18 0700 12/29/18 0800  BP: 122/78 128/86  Pulse: 81 98  Resp: 12 10  Temp:  98.8 F (37.1 C)  SpO2: 100% 100%   Vitals:   12/29/18 0500 12/29/18 0600 12/29/18 0700 12/29/18 0800  BP: (!) 142/72 100/69 122/78 128/86  Pulse: 82 85 81 98  Resp: 12 14 12 10   Temp:    98.8 F (37.1 C)  TempSrc:    Oral  SpO2: 100% 100% 100% 100%  Weight:      Height:        General: Pt is alert, awake, not in acute distress Cardiovascular: RRR, S1/S2 +, no rubs, no gallops Respiratory: CTA bilaterally, no wheezing, no rhonchi Abdominal: Soft, NT, ND, bowel sounds + Extremities: no edema, no cyanosis    The results of significant diagnostics from this hospitalization (including imaging, microbiology, ancillary and laboratory) are listed below for reference.     Microbiology: Recent Results (from the past 240 hour(s))  Culture, Urine     Status: Abnormal   Collection Time: 12/24/18  1:09 PM   Specimen: Urine, Random  Result Value Ref Range Status   Specimen Description   Final    URINE, RANDOM Performed at [place] [address], [place],  [place] [address]    Special Requests   Final    NONE Performed at [facility], [place] [address], [place], [place] [address]    Culture >=100,000 COLONIES/mL YEAST (A)  Final   Report Status 12/26/2018 FINAL  Final  SARS CORONAVIRUS 2 (TAT 6-24 HRS) Nasopharyngeal Nasopharyngeal Swab     Status: None   Collection Time: 12/24/18  2:18 PM   Specimen: Nasopharyngeal Swab  Result Value Ref Range Status   SARS Coronavirus 2 NEGATIVE NEGATIVE Final    Comment: (NOTE) SARS-CoV-2 target nucleic acids are NOT DETECTED. The SARS-CoV-2 RNA is generally detectable in upper and lower respiratory specimens during the acute phase of infection. Negative results do not preclude SARS-CoV-2 infection, do not rule out co-infections with other pathogens, and should not be used as the sole basis for treatment or other patient management decisions. Negative results must be combined with clinical observations, patient history, and epidemiological information. The expected result is Negative. Fact Sheet for Patients: SugarRoll.be Fact Sheet for Healthcare Providers: https://www.woods-mathews.com/ This test is not yet approved or cleared by the [place] FDA and  has been authorized for detection and/or diagnosis of SARS-CoV-2 by FDA under an Emergency Use Authorization (EUA). This EUA will remain  in effect (meaning this test can be used) for the duration of the COVID-19 declaration under Section 56 4(b)(1) of the Act, 21 U.S.C. section 360bbb-3(b)(1), unless the authorization is terminated or revoked sooner. Performed at [facility], [place] [address], [place], [place] [address]   MRSA PCR Screening     Status: None   Collection Time: 12/24/18  4:13 PM   Specimen: Nasal Mucosa; Nasopharyngeal  Result Value Ref Range Status   MRSA by PCR NEGATIVE NEGATIVE Final    Comment:        The GeneXpert MRSA Assay (FDA approved for NASAL  specimens only), is one component of a comprehensive MRSA colonization surveillance program. It is not intended to diagnose MRSA infection nor to guide or monitor treatment for MRSA infections. Performed at [facility], [place] [address], [place],  [address]      Labs: BNP (last 3 results) No results for input(s): BNP in the last 8760 hours. Basic Metabolic Panel: Recent Labs  Lab 12/26/18 1712 12/27/18 0253 12/27/18 1446 12/28/18 0156 12/29/18 0259  NA 137 139 139 140 138  K 3.9 3.5 3.1* 2.9* 3.0*  CL 106 110 110 107 107  CO2 20* 18* 22 19* 23  GLUCOSE 180* 143* 128* 130* 196*  BUN 9 7 7  5* <5*  CREATININE 0.62 0.62 0.57 0.63 0.53  CALCIUM 8.7* 8.7* 8.4* 8.6* 8.4*  MG  --   --   --  1.5* 1.8   Liver Function Tests: Recent Labs  Lab 12/24/18 1022  AST 18  ALT 13  ALKPHOS 142*  BILITOT 0.9  PROT 10.3*  ALBUMIN 5.1*   Recent Labs  Lab 12/24/18 1022  LIPASE 16   No results for input(s): AMMONIA in the last 168 hours. CBC: Recent Labs  Lab 12/24/18 1022 12/25/18 0541 12/29/18 0259  WBC 9.0 13.0* 7.8  NEUTROABS 7.2  --   --   HGB 13.8  10.8* 10.9*  HCT 43.1 33.0* 34.8*  MCV 86.0 83.3 85.9  PLT 616* 510* 498*   Cardiac Enzymes: No results for input(s): CKTOTAL, CKMB, CKMBINDEX, TROPONINI in the last 168 hours. BNP: Invalid input(s): POCBNP CBG: Recent Labs  Lab 12/28/18 1559 12/28/18 1953 12/28/18 2321 12/29/18 0326 12/29/18 0759  GLUCAP 100* 157* 155* 186* 77   D-Dimer No results for input(s): DDIMER in the last 72 hours. Hgb A1c No results for input(s): HGBA1C in the last 72 hours. Lipid Profile No results for input(s): CHOL, HDL, LDLCALC, TRIG, CHOLHDL, LDLDIRECT in the last 72 hours. Thyroid function studies No results for input(s): TSH, T4TOTAL, T3FREE, THYROIDAB in the last 72 hours.  Invalid input(s): FREET3 Anemia work up No results for input(s): VITAMINB12, FOLATE, FERRITIN, TIBC, IRON, RETICCTPCT in the  last 72 hours. Urinalysis    Component Value Date/Time   COLORURINE AMBER (A) 12/28/2018 2312   APPEARANCEUR CLOUDY (A) 12/28/2018 2312   LABSPEC 1.029 12/28/2018 2312   PHURINE 6.0 12/28/2018 2312   GLUCOSEU NEGATIVE 12/28/2018 2312   GLUCOSEU >=1000 11/07/2012 1205   HGBUR LARGE (A) 12/28/2018 2312   BILIRUBINUR NEGATIVE 12/28/2018 2312   KETONESUR 20 (A) 12/28/2018 2312   PROTEINUR 100 (A) 12/28/2018 2312   UROBILINOGEN 0.2 11/24/2014 1045   NITRITE NEGATIVE 12/28/2018 2312   LEUKOCYTESUR LARGE (A) 12/28/2018 2312   Sepsis Labs Invalid input(s): PROCALCITONIN,  WBC,  LACTICIDVEN Microbiology Recent Results (from the past 240 hour(s))  Culture, Urine     Status: Abnormal   Collection Time: 12/24/18  1:09 PM   Specimen: Urine, Random  Result Value Ref Range Status   Specimen Description   Final    URINE, RANDOM Performed at [facility], [place] [address], [place], [place] [address]    Special Requests   Final    NONE Performed at [facility], [place] [address] [place], [place] [address]    Culture >=100,000 COLONIES/mL YEAST (A)  Final   Report Status 12/26/2018 FINAL  Final  SARS CORONAVIRUS 2 (TAT 6-24 HRS) Nasopharyngeal Nasopharyngeal Swab     Status: None   Collection Time: 12/24/18  2:18 PM   Specimen: Nasopharyngeal Swab  Result Value Ref Range Status   SARS Coronavirus 2 NEGATIVE NEGATIVE Final    Comment: (NOTE) SARS-CoV-2 target nucleic acids are NOT DETECTED. The SARS-CoV-2 RNA is generally detectable in upper and lower respiratory specimens during the acute phase of infection. Negative results do not preclude SARS-CoV-2 infection, do not rule out co-infections with other pathogens, and should not be used as the sole basis for treatment or other patient management decisions. Negative results must be combined with clinical observations, patient history, and epidemiological information. The expected result is  Negative. Fact Sheet for Patients: SugarRoll.be Fact Sheet for Healthcare Providers: https://www.woods-mathews.com/ This test is not yet approved or cleared by the [place] FDA and  has been authorized for detection and/or diagnosis of SARS-CoV-2 by FDA under an Emergency Use Authorization (EUA). This EUA will remain  in effect (meaning this test can be used) for the duration of the COVID-19 declaration under Section 56 4(b)(1) of the Act, 21 U.S.C. section 360bbb-3(b)(1), unless the authorization is terminated or revoked sooner. Performed at [facility], [place] [address] [place],  [address]   MRSA PCR Screening     Status: None   Collection Time: 12/24/18  4:13 PM   Specimen: Nasal Mucosa; Nasopharyngeal  Result Value Ref Range Status   MRSA by  PCR NEGATIVE NEGATIVE Final    Comment:        The GeneXpert MRSA Assay (FDA approved for NASAL specimens only), is one component of a comprehensive MRSA colonization surveillance program. It is not intended to diagnose MRSA infection nor to guide or monitor treatment for MRSA infections. Performed at [facility], [place] [address] [place], [place] [address]     Please note: You were cared for by a hospitalist during your hospital stay. Once you are discharged, your primary care physician will handle any further medical issues. Please note that NO REFILLS for any discharge medications will be authorized once you are discharged, as it is imperative that you return to your primary care physician (or establish a relationship with a primary care physician if you do not have one) for your post hospital discharge needs so that they can reassess your need for medications and monitor your lab values.    Time coordinating discharge: 40 minutes  SIGNED:   Shelly Coss, MD  [facility] 12/29/2018, 10:21 AM Pager [phone]  If 7PM-7AM, please contact  night-coverage [email or web address] Password [email or web address]

## 2018-12-31 ENCOUNTER — Emergency Department (HOSPITAL_COMMUNITY)
Admission: EM | Admit: 2018-12-31 | Discharge: 2018-12-31 | Disposition: A | Discharge status: Home / Self Care | Payer: BC Managed Care – PPO | Attending: Emergency Medicine | Admitting: Emergency Medicine

## 2018-12-31 ENCOUNTER — Other Ambulatory Visit: Payer: Self-pay

## 2018-12-31 ENCOUNTER — Encounter (HOSPITAL_COMMUNITY): Payer: Self-pay | Admitting: Emergency Medicine

## 2018-12-31 DIAGNOSIS — I5032 Chronic diastolic (congestive) heart failure: Secondary | ICD-10-CM | POA: Diagnosis not present

## 2018-12-31 DIAGNOSIS — I110 Hypertensive heart disease with heart failure: Secondary | ICD-10-CM | POA: Diagnosis not present

## 2018-12-31 DIAGNOSIS — E109 Type 1 diabetes mellitus without complications: Secondary | ICD-10-CM | POA: Diagnosis not present

## 2018-12-31 DIAGNOSIS — Z79899 Other long term (current) drug therapy: Secondary | ICD-10-CM | POA: Diagnosis not present

## 2018-12-31 DIAGNOSIS — R112 Nausea with vomiting, unspecified: Secondary | ICD-10-CM | POA: Diagnosis present

## 2018-12-31 DIAGNOSIS — Z91010 Allergy to peanuts: Secondary | ICD-10-CM | POA: Diagnosis not present

## 2018-12-31 DIAGNOSIS — Z794 Long term (current) use of insulin: Secondary | ICD-10-CM | POA: Diagnosis not present

## 2018-12-31 DIAGNOSIS — J45909 Unspecified asthma, uncomplicated: Secondary | ICD-10-CM | POA: Insufficient documentation

## 2018-12-31 LAB — CBC WITH DIFFERENTIAL/PLATELET
Abs Immature Granulocytes: 0.02 10*3/uL (ref 0.00–0.07)
Basophils Absolute: 0 10*3/uL (ref 0.0–0.1)
Basophils Relative: 0 %
Eosinophils Absolute: 0.2 10*3/uL (ref 0.0–0.5)
Eosinophils Relative: 2 %
HCT: 36.8 % (ref 36.0–46.0)
Hemoglobin: 11.8 g/dL — ABNORMAL LOW (ref 12.0–15.0)
Immature Granulocytes: 0 %
Lymphocytes Relative: 17 %
Lymphs Abs: 1.7 10*3/uL (ref 0.7–4.0)
MCH: 27.1 pg (ref 26.0–34.0)
MCHC: 32.1 g/dL (ref 30.0–36.0)
MCV: 84.4 fL (ref 80.0–100.0)
Monocytes Absolute: 0.5 10*3/uL (ref 0.1–1.0)
Monocytes Relative: 5 %
Neutro Abs: 7.7 10*3/uL (ref 1.7–7.7)
Neutrophils Relative %: 76 %
Platelets: 679 10*3/uL — ABNORMAL HIGH (ref 150–400)
RBC: 4.36 MIL/uL (ref 3.87–5.11)
RDW: 13.8 % (ref 11.5–15.5)
WBC: 10.1 10*3/uL (ref 4.0–10.5)
nRBC: 0 % (ref 0.0–0.2)

## 2018-12-31 LAB — URINALYSIS, ROUTINE W REFLEX MICROSCOPIC
Bilirubin Urine: NEGATIVE
Glucose, UA: >=500 mg/dL — AB
Ketones, ur: 80 mg/dL — AB
Nitrite: NEGATIVE
Protein, ur: 30 mg/dL — AB
RBC / HPF: >50 RBC/hpf — ABNORMAL HIGH (ref 0–5)
Specific Gravity, Urine: 1.013 (ref 1.005–1.030)
pH: 7 (ref 5.0–8.0)

## 2018-12-31 LAB — COMPREHENSIVE METABOLIC PANEL
ALT: 14 U/L (ref 0–44)
AST: 26 U/L (ref 15–41)
Albumin: 3.8 g/dL (ref 3.5–5.0)
Alkaline Phosphatase: 94 U/L (ref 38–126)
Anion gap: 13 (ref 5–15)
BUN: 5 mg/dL — ABNORMAL LOW (ref 6–20)
CO2: 29 mmol/L (ref 22–32)
Calcium: 9 mg/dL (ref 8.9–10.3)
Chloride: 95 mmol/L — ABNORMAL LOW (ref 98–111)
Creatinine, Ser: 0.71 mg/dL (ref 0.44–1.00)
GFR calc Af Amer: >60 mL/min (ref >60)
GFR calc non Af Amer: >60 mL/min (ref >60)
Glucose, Bld: 300 mg/dL — ABNORMAL HIGH (ref 70–99)
Potassium: 3.6 mmol/L (ref 3.5–5.1)
Sodium: 137 mmol/L (ref 135–145)
Total Bilirubin: 1 mg/dL (ref 0.3–1.2)
Total Protein: 7.8 g/dL (ref 6.5–8.1)

## 2018-12-31 LAB — CBG MONITORING, ED: Glucose-Capillary: 274 mg/dL — ABNORMAL HIGH (ref 70–99)

## 2018-12-31 LAB — I-STAT BETA HCG BLOOD, ED (MC, WL, AP ONLY): I-stat hCG, quantitative: <5 m[IU]/mL (ref <5)

## 2018-12-31 LAB — LIPASE, BLOOD: Lipase: 17 U/L (ref 11–51)

## 2018-12-31 MED ORDER — SODIUM CHLORIDE 0.9 % IV BOLUS
1000.0000 mL | Freq: Once | INTRAVENOUS | Status: AC
  Administered 2018-12-31: 12:00:00 1000 mL via INTRAVENOUS

## 2018-12-31 MED ORDER — LORAZEPAM 2 MG/ML IJ SOLN
1.0000 mg | Freq: Once | INTRAMUSCULAR | Status: AC
  Administered 2018-12-31: 1 mg via INTRAVENOUS
  Filled 2018-12-31: qty 1

## 2018-12-31 MED ORDER — HYDROMORPHONE HCL 1 MG/ML IJ SOLN: 1.0000 mg | Freq: Once | INTRAMUSCULAR | Status: DC

## 2018-12-31 MED ORDER — PROMETHAZINE HCL 25 MG/ML IJ SOLN
12.5000 mg | Freq: Once | INTRAMUSCULAR | Status: AC
  Administered 2018-12-31: 12.5 mg via INTRAVENOUS
  Filled 2018-12-31: qty 1

## 2018-12-31 MED ORDER — SODIUM CHLORIDE 0.9 % IV BOLUS
1000.0000 mL | Freq: Once | INTRAVENOUS | Status: AC
  Administered 2018-12-31: 1000 mL via INTRAVENOUS

## 2018-12-31 MED ORDER — HYDROMORPHONE HCL 1 MG/ML IJ SOLN: 0.5000 mg | Freq: Once | INTRAMUSCULAR | Status: DC

## 2018-12-31 MED ORDER — ONDANSETRON HCL 4 MG/2ML IJ SOLN
4.0000 mg | Freq: Once | INTRAMUSCULAR | Status: AC
  Administered 2018-12-31: 4 mg via INTRAVENOUS
  Filled 2018-12-31: qty 2

## 2018-12-31 MED ORDER — SODIUM CHLORIDE 0.9 % IV SOLN
1.0000 g | Freq: Once | INTRAVENOUS | Status: DC
  Filled 2018-12-31: qty 10

## 2018-12-31 MED ORDER — DROPERIDOL 2.5 MG/ML IJ SOLN
1.2500 mg | Freq: Once | INTRAMUSCULAR | Status: AC
  Administered 2018-12-31: 1.25 mg via INTRAVENOUS
  Filled 2018-12-31: qty 2

## 2018-12-31 MED ORDER — PROMETHAZINE HCL 12.5 MG PO TABS: 12.5000 mg | ORAL_TABLET | Freq: Four times a day (QID) | ORAL | 0 refills | Status: DC | PRN

## 2018-12-31 MED ORDER — DICYCLOMINE HCL 20 MG PO TABS: 20.0000 mg | ORAL_TABLET | Freq: Three times a day (TID) | ORAL | 0 refills | Status: DC | PRN

## 2018-12-31 MED ORDER — DICYCLOMINE HCL 10 MG/ML IM SOLN
20.0000 mg | Freq: Once | INTRAMUSCULAR | Status: AC
  Administered 2018-12-31: 20 mg via INTRAMUSCULAR
  Filled 2018-12-31: qty 2

## 2018-12-31 NOTE — ED Notes (Signed)
PT state she is incontinent

## 2018-12-31 NOTE — Discharge Instructions (Addendum)
You were seen in the emergency department today for vomiting and abdominal pain.  Your work-up was overall reassuring.  Your blood sugar was elevated, please be sure to monitor this closely at home.  We are sending home with Phenergan to take every 6 hours as needed for nausea and vomiting and Bentyl to take every 8 hours as needed for abdominal cramping  We have prescribed you new medication(s) today. Discuss the medications prescribed today with your pharmacist as they can have adverse effects and interactions with your other medicines including over the counter and prescribed medications. Seek medical evaluation if you start to experience new or abnormal symptoms after taking one of these medicines, seek care immediately if you start to experience difficulty breathing, feeling of your throat closing, facial swelling, or rash as these could be indications of a more serious allergic reaction Please follow-up closely with your primary care provider within 48 hours.  Return to the ER for new or worsening symptoms including but not limited to inability to keep fluids down, increased pain, blood in your vomit, blood in your stool, fever, or any other concerns.

## 2018-12-31 NOTE — ED Provider Notes (Signed)
[facility] DEPT Provider Note   CSN: [id number] Arrival date & time: 12/31/18  0941     History Chief Complaint  Patient presents with  . Abdominal Pain  . Emesis    [name] is a 28 y.o. female with a history of T1DM, gastroparesis, pancreatitis, hepatic steatosis, GERD, hypertension, cholecystectomy, and intractable cyclic vomiting syndrome who presents to the ED with complaints of N/V & abdominal pain that began last night. Patient states she has had too numerous to count episodes of emesis with associated generalized abdominal cramping. Symptoms are constant. No alleviating/aggravating factors. She states this feels like her usual gastroparesis. She is requesting phenergan & dilaudid. Denies marijuana use recently- last used in September. Denies fever, chills, hematemesis, melena, hematochezia, diarrhea, constipation, dysuria, vaginal bleeding, or vaginal discharge.   HPI     Past Medical History:  Diagnosis Date  . Anxiety   . Arthritis    "hands, feet, knees" (12/18/2016)  . Asthma   . Diabetic gastroparesis ([place])    Per gastric emptying study 07/09/16 which showed significant delayed gastric emptying.  . Gallstones   . Gastroparesis   . GERD (gastroesophageal reflux disease)   . Heart murmur   . Hepatic steatosis 11/26/2014   and hepatomegaly  . Hypertension    hx (12/18/2016)  . Intractable cyclical vomiting syndrome    Archie Endo 12/18/2016  . Liver mass 11/26/2014  . Pancreatitis, acute 11/26/2014  . Pneumonia    "as a teen X 1" (12/18/2016)  . Type I diabetes mellitus ([place]) 2007   IDDM.  poorly controlled, multiple admits with DKA    Patient Active Problem List   Diagnosis Date Noted  . Gastroparesis due to secondary diabetes ([place]) 11/21/2018  . Diabetes mellitus ([place]) 11/13/2018  . HTN (hypertension) 11/13/2018  . Marijuana abuse 11/13/2018  . Acute pyelonephritis 10/07/2018  . Hydronephrosis of right kidney  10/05/2018  . Diffuse pain   . Elevated blood pressure reading 12/03/2017  . Tachycardia 11/30/2017  . Hypernatremia 11/02/2017  . Nausea and vomiting 01/22/2017  . Nausea & vomiting 01/21/2017  . Intractable cyclical vomiting syndrome 12/18/2016  . Leukocytosis 10/24/2016  . N&V (nausea and vomiting) 10/23/2016  . DKA, type 1 ([place]) 09/26/2016  . Thrombocytosis ([place]) 07/08/2016  . Candiduria, asymptomatic 06/23/2016  . Hyponatremia 06/13/2016  . Hematuria 05/13/2016  . UTI (urinary tract infection) 01/22/2016  . Diarrhea 11/09/2015  . Acute urinary retention   . Gastroparesis due to DM ([place]) 07/10/2015  . GERD (gastroesophageal reflux disease) 07/10/2015  . Depression with anxiety 07/10/2015  . Gastroparesis 06/22/2015  . Altered mental status 06/22/2015  . Volume depletion 06/10/2015  . Protein-calorie malnutrition, severe 06/10/2015  . Hyperglycemia   . Elevated bilirubin   . Hematemesis with nausea   . Intractable nausea and vomiting 05/28/2015  . Abdominal pain in female   . Abdominal pain 05/24/2015  . Hypertension 05/24/2015  . Dehydration   . Diabetic gastroparesis ([place])   . Chronic diastolic heart failure ([place]) 04/11/2015  . Hematemesis 04/08/2015  . DKA (diabetic ketoacidoses) ([place]) 03/22/2015  . S/P laparoscopic cholecystectomy 02/11/2015  . Postextubation stridor   . Pancreatitis, acute 11/26/2014  . Volume overload 11/26/2014  . Hypokalemia 11/26/2014  . Hepatic steatosis 11/26/2014  . Liver mass 11/26/2014  . Sepsis ([place]) 11/25/2014  . Sinus tachycardia 11/25/2014  . Hypomagnesemia 11/25/2014  . Hypophosphatemia 11/25/2014  . Elevated LFTs 11/24/2014  . AKI (acute kidney injury) ([place]) 11/24/2014  . Migraine headache 11/24/2014  .  Asthma 06/29/2012  . Uncontrolled type 1 diabetes mellitus ([place]) 06/19/2010  . Goiter, unspecified 06/19/2010    Past Surgical History:  Procedure Laterality Date  . CHOLECYSTECTOMY N/A 02/11/2015   Procedure:  LAPAROSCOPIC CHOLECYSTECTOMY WITH INTRAOPERATIVE CHOLANGIOGRAM;  Surgeon: Greer Pickerel, MD;  Location: [facility];  Service: General;  Laterality: N/A;  . ESOPHAGOGASTRODUODENOSCOPY (EGD) WITH PROPOFOL Left 09/20/2014   Procedure: ESOPHAGOGASTRODUODENOSCOPY (EGD) WITH PROPOFOL;  Surgeon: Arta Silence, MD;  Location: [facility] ENDOSCOPY;  Service: Endoscopy;  Laterality: Left;  . WISDOM TOOTH EXTRACTION       OB History    Gravida  2   Para  1   Term  0   Preterm  1   AB  1   Living  1     SAB  0   TAB  1   Ectopic  0   Multiple  0   Live Births  1           Family History  Problem Relation Age of Onset  . Heart disease Maternal Grandmother   . Heart disease Maternal Grandfather   . Diabetes Mother   . Hyperlipidemia Mother   . Hypertension Father   . Heart disease Father   . Hypertension Paternal Grandmother   . Cancer Paternal Grandfather     Social History   Tobacco Use  . Smoking status: Never Smoker  . Smokeless tobacco: Never Used  Substance Use Topics  . Alcohol use: No  . Drug use: Yes    Types: Marijuana    Home Medications Prior to Admission medications   Medication Sig Start Date End Date Taking? Authorizing Provider  albuterol (PROVENTIL HFA;VENTOLIN HFA) 108 (90 Base) MCG/ACT inhaler Inhale 1-2 puffs into the lungs every 6 (six) hours as needed for wheezing or shortness of breath.    [provider]  blood glucose meter kit and supplies Dispense based on patient and insurance preference. Use up to four times daily as directed. (FOR ICD-10 E10.9, E11.9). 12/07/17   Eugenie Filler, MD  glucagon (GLUCAGON EMERGENCY) 1 MG injection Inject 1 mg into the muscle once as needed (For very low blood sugars. May repeat in 15-20 minutes if needed.). 12/20/16   Hongalgi, Lenis Dickinson, MD  HYDROcodone-acetaminophen (NORCO/VICODIN) 5-325 MG tablet Take 1 tablet by mouth every 6 (six) hours as needed for moderate pain. 12/21/18   Milton Ferguson, MD  Insulin Pen  Needle 31G X 5 MM MISC 30 Units by Does not apply route at bedtime. 12/07/17   Eugenie Filler, MD  lisinopril (ZESTRIL) 2.5 MG tablet Take 2.5 mg by mouth daily.    [provider]  metoCLOPramide (REGLAN) 10 MG tablet Take 1 tablet (10 mg total) by mouth 3 (three) times daily with meals. 11/18/18 11/18/19  Kathie Dike, MD  naproxen (NAPROSYN) 375 MG tablet Take 1 tablet (375 mg total) by mouth 2 (two) times daily as needed. Patient taking differently: Take 375 mg by mouth 2 (two) times daily as needed for moderate pain.  12/12/18   Albrizze, Kaitlyn E, PA-C  NOVOLOG 100 UNIT/ML injection Inject 50 Units into the skin as directed. Inject up to 50 units/day. 12/08/18   [provider]  ondansetron (ZOFRAN ODT) 4 MG disintegrating tablet Take 1 tablet (4 mg total) by mouth every 8 (eight) hours as needed for nausea or vomiting. 11/18/18   Kathie Dike, MD  potassium chloride 20 MEQ TBCR Take 20 mEq by mouth daily for 5 days. 12/30/18 01/04/19  Adhikari,  Amrit, MD  promethazine (PHENERGAN) 25 MG tablet Take 25 mg by mouth every 8 (eight) hours as needed for nausea or vomiting.  11/30/18   [provider]  traZODone (DESYREL) 50 MG tablet Take 25-50 mg by mouth at bedtime as needed for sleep.  11/30/18   [provider]  trimethoprim (TRIMPEX) 100 MG tablet Take 100 mg by mouth daily. 11/11/18   [provider]    Allergies    Peanut-containing drug products, Food, and Ultram [tramadol]  Review of Systems   Review of Systems  Constitutional: Negative for chills and fever.  Respiratory: Negative for shortness of breath.   Cardiovascular: Negative for chest pain.  Gastrointestinal: Positive for abdominal pain, nausea and vomiting. Negative for anal bleeding, blood in stool, constipation and diarrhea.  Genitourinary: Negative for dysuria, vaginal bleeding and vaginal discharge.  Neurological: Negative for syncope.  All other systems reviewed  and are negative.   Physical Exam Updated Vital Signs BP (!) 132/115   Pulse (!) 116   Temp 98.4 F (36.9 C) (Oral)   Resp 18   LMP 12/18/2018 Comment: neg preg test  SpO2 99%   Physical Exam Vitals and nursing note reviewed.  Constitutional:      Appearance: She is well-developed. She is not toxic-appearing.     Comments: Actively retching on exam.   HENT:     Head: Normocephalic and atraumatic.  Eyes:     General:        Right eye: No discharge.        Left eye: No discharge.     Conjunctiva/sclera: Conjunctivae normal.  Cardiovascular:     Rate and Rhythm: Regular rhythm. Tachycardia present.  Pulmonary:     Effort: Pulmonary effort is normal. No respiratory distress.     Breath sounds: Normal breath sounds. No wheezing, rhonchi or rales.  Abdominal:     General: There is no distension.     Palpations: Abdomen is soft.     Tenderness: There is abdominal tenderness (mild, generalized). There is no guarding or rebound.  Musculoskeletal:     Cervical back: Neck supple.  Skin:    General: Skin is warm and dry.     Findings: No rash.  Neurological:     Mental Status: She is alert.     Comments: Clear speech.   Psychiatric:        Behavior: Behavior normal.     ED Results / Procedures / Treatments   Labs (all labs ordered are listed, but only abnormal results are displayed) Labs Reviewed  COMPREHENSIVE METABOLIC PANEL - Abnormal; Notable for the following components:      Result Value   Chloride 95 (*)    Glucose, Bld 300 (*)    BUN 5 (*)    All other components within normal limits  CBC WITH DIFFERENTIAL/PLATELET - Abnormal; Notable for the following components:   Hemoglobin 11.8 (*)    Platelets 679 (*)    All other components within normal limits  URINALYSIS, ROUTINE W REFLEX MICROSCOPIC - Abnormal; Notable for the following components:   APPearance CLOUDY (*)    Glucose, UA >=500 (*)    Hgb urine dipstick MODERATE (*)    Ketones, ur 80 (*)     Protein, ur 30 (*)    Leukocytes,Ua LARGE (*)    RBC / HPF >50 (*)    Bacteria, UA RARE (*)    All other components within normal limits  CBG MONITORING, ED - Abnormal; Notable  for the following components:   Glucose-Capillary 274 (*)    All other components within normal limits  URINE CULTURE  LIPASE, BLOOD  I-STAT BETA HCG BLOOD, ED ([facility], [facility], [facility] ONLY)  CBG MONITORING, ED    EKG EKG Interpretation  Date/Time:  Saturday December 31 2018 11:20:41 EST Ventricular Rate:  103 PR Interval:    QRS Duration: 83 QT Interval:  340 QTC Calculation: 445 R Axis:   70 Text Interpretation: Sinus tachycardia Right atrial enlargement LVH by voltage Baseline wander in lead(s) V4 V6 similar to Nov 21 2018 Confirmed by Sherwood Gambler [phone]) on 12/31/2018 11:43:46 AM   Radiology No results found.  Procedures Procedures (including critical care time)  Medications Ordered in ED Medications - No data to display  ED Course/MDM I have reviewed the triage vital signs and the nursing notes.  Pertinent labs & imaging results that were available during my care of the patient were reviewed by me and considered in my medical decision making (see chart for details).  Patient with history of T1DM and gastroparesis with multiple ED visits and admissions for nausea, vomiting, and abdominal pain presents to the emergency department with similar symptoms.  Most recent admission was earlier this month. Patient is actively vomiting on exam, she is mildly tachycardic and hypertensive, doubt hypertensive emergency.  She is afebrile.  Abdomen has mild generalized tenderness but is soft and without peritoneal signs.  CBG 274.  Will place IV, start fluids, give Phenergan and IM Bentyl.  Will avoid narcotics at this time.  Check EKG as I anticipate several rounds of antiemetics.  CBC: Anemia similar to prior. No leukocytosis/leukopenia.  CMP: Hyperglycemia without acidosis or anion gap elevation.  No significant  electrolyte derangement.  Renal function preserved.  LFTs WNL. Lipase: WNL Pregnancy test: Negative EKG: Similar to prior. QTc 445  14:42: RE-EVAL: persistent pain/nausea, only has received 500 ccs of fluids thus far, nursing staff has requested patient keep her arm straight to allow fluids to be administered multiple times. Will administer droperidol to assist with symptomatic control.   Urinalysis: Several abnormalities, very similar to her prior multiple UAs on record. Given similarity without urinary symptoms will not treat for UTI currently, will send for culture.  Patient sleeping on multiple reassessments status post droperidol.  She has been able to tolerate p.o without difficulty.  Appears appropriate for discharge home at this time. I discussed results, treatment plan, need for follow-up, and return precautions with the patient. Provided opportunity for questions, patient confirmed understanding and is in agreement with plan.   Findings and plan of care discussed with supervising physician Dr. Regenia Skeeter who is in agreement.   Final Clinical Impression(s) / ED Diagnoses Final diagnoses:  Non-intractable vomiting with nausea, unspecified vomiting type    Rx / DC Orders ED Discharge Orders         Ordered    dicyclomine (BENTYL) 20 MG tablet  Every 8 hours PRN     12/31/18 1724    promethazine (PHENERGAN) 12.5 MG tablet  Every 6 hours PRN     12/31/18 [address] [place] R, PA-C 12/31/18 1746    Sherwood Gambler, MD 01/01/19 [phone]

## 2018-12-31 NOTE — ED Triage Notes (Signed)
Pt reports has gastroparesis and last night started having abd pains with n/v.

## 2018-12-31 NOTE — ED Notes (Signed)
Attempted to place [place] IV in right upper arm. Unsuccessful attempt. IV Team consult order put in.

## 2019-01-03 LAB — URINE CULTURE: Culture: 20000 — AB

## 2019-01-04 ENCOUNTER — Telehealth: Payer: Self-pay | Admitting: Emergency Medicine

## 2019-01-04 NOTE — Telephone Encounter (Signed)
Post ED Visit - Positive Culture Follow-up  Culture report reviewed by antimicrobial stewardship pharmacist: [facility] Team []  Elenor Quinones, Pharm.D. []  Heide Guile, Pharm.D., BCPS AQ-ID []  Parks Neptune, Pharm.D., BCPS []  Alycia Rossetti, Pharm.D., BCPS []  [place], Pharm.D., BCPS, AAHIVP []  Legrand Como, Pharm.D., BCPS, AAHIVP []  Salome Arnt, PharmD, BCPS []  Johnnette Gourd, PharmD, BCPS []  Hughes Better, PharmD, BCPS []  Leeroy Cha, PharmD []  Laqueta Linden, PharmD, BCPS []  Albertina Parr, PharmD  [place] Team []  Leodis Sias, PharmD []  Lindell Spar, PharmD [x]  Royetta Asal, PharmD []  Graylin Shiver, Rph []  Rema Fendt) Glennon Mac, PharmD []  Arlyn Dunning, PharmD []  Netta Cedars, PharmD []  Dia Sitter, PharmD []  Leone Haven, PharmD []  Gretta Arab, PharmD []  Theodis Shove, PharmD []  Peggyann Juba, PharmD []  Reuel Boom, PharmD   Positive urine culture Treated with none,asymptomatic,no further patient follow-up is required at this time.  Hazle Nordmann 01/04/2019, 4:04 PM

## 2019-01-14 ENCOUNTER — Encounter (HOSPITAL_COMMUNITY): Payer: Self-pay

## 2019-01-14 ENCOUNTER — Observation Stay (HOSPITAL_COMMUNITY)
Admission: EM | Admit: 2019-01-14 | Discharge: 2019-01-20 | Disposition: A | Discharge status: Home / Self Care | Payer: BC Managed Care – PPO | Attending: Internal Medicine | Admitting: Internal Medicine

## 2019-01-14 ENCOUNTER — Other Ambulatory Visit: Payer: Self-pay

## 2019-01-14 DIAGNOSIS — K3184 Gastroparesis: Secondary | ICD-10-CM | POA: Diagnosis not present

## 2019-01-14 DIAGNOSIS — E1010 Type 1 diabetes mellitus with ketoacidosis without coma: Secondary | ICD-10-CM | POA: Insufficient documentation

## 2019-01-14 DIAGNOSIS — Z794 Long term (current) use of insulin: Secondary | ICD-10-CM | POA: Insufficient documentation

## 2019-01-14 DIAGNOSIS — Z888 Allergy status to other drugs, medicaments and biological substances status: Secondary | ICD-10-CM | POA: Diagnosis not present

## 2019-01-14 DIAGNOSIS — E876 Hypokalemia: Secondary | ICD-10-CM | POA: Insufficient documentation

## 2019-01-14 DIAGNOSIS — N179 Acute kidney failure, unspecified: Secondary | ICD-10-CM | POA: Insufficient documentation

## 2019-01-14 DIAGNOSIS — Z8249 Family history of ischemic heart disease and other diseases of the circulatory system: Secondary | ICD-10-CM | POA: Insufficient documentation

## 2019-01-14 DIAGNOSIS — J45909 Unspecified asthma, uncomplicated: Secondary | ICD-10-CM | POA: Diagnosis not present

## 2019-01-14 DIAGNOSIS — R319 Hematuria, unspecified: Secondary | ICD-10-CM | POA: Insufficient documentation

## 2019-01-14 DIAGNOSIS — K219 Gastro-esophageal reflux disease without esophagitis: Secondary | ICD-10-CM | POA: Diagnosis present

## 2019-01-14 DIAGNOSIS — E1069 Type 1 diabetes mellitus with other specified complication: Secondary | ICD-10-CM

## 2019-01-14 DIAGNOSIS — F419 Anxiety disorder, unspecified: Secondary | ICD-10-CM | POA: Diagnosis present

## 2019-01-14 DIAGNOSIS — Z833 Family history of diabetes mellitus: Secondary | ICD-10-CM | POA: Diagnosis not present

## 2019-01-14 DIAGNOSIS — R112 Nausea with vomiting, unspecified: Secondary | ICD-10-CM | POA: Diagnosis present

## 2019-01-14 DIAGNOSIS — Z91010 Allergy to peanuts: Secondary | ICD-10-CM

## 2019-01-14 DIAGNOSIS — M19042 Primary osteoarthritis, left hand: Secondary | ICD-10-CM | POA: Diagnosis present

## 2019-01-14 DIAGNOSIS — M19041 Primary osteoarthritis, right hand: Secondary | ICD-10-CM | POA: Diagnosis present

## 2019-01-14 DIAGNOSIS — Z79899 Other long term (current) drug therapy: Secondary | ICD-10-CM | POA: Diagnosis not present

## 2019-01-14 DIAGNOSIS — T85614A Breakdown (mechanical) of insulin pump, initial encounter: Secondary | ICD-10-CM | POA: Diagnosis present

## 2019-01-14 DIAGNOSIS — Z9049 Acquired absence of other specified parts of digestive tract: Secondary | ICD-10-CM | POA: Diagnosis not present

## 2019-01-14 DIAGNOSIS — M170 Bilateral primary osteoarthritis of knee: Secondary | ICD-10-CM | POA: Diagnosis present

## 2019-01-14 DIAGNOSIS — Z20828 Contact with and (suspected) exposure to other viral communicable diseases: Secondary | ICD-10-CM | POA: Diagnosis not present

## 2019-01-14 DIAGNOSIS — E1143 Type 2 diabetes mellitus with diabetic autonomic (poly)neuropathy: Secondary | ICD-10-CM | POA: Diagnosis not present

## 2019-01-14 DIAGNOSIS — Z8701 Personal history of pneumonia (recurrent): Secondary | ICD-10-CM

## 2019-01-14 DIAGNOSIS — Z20822 Contact with and (suspected) exposure to covid-19: Secondary | ICD-10-CM | POA: Diagnosis present

## 2019-01-14 DIAGNOSIS — M19071 Primary osteoarthritis, right ankle and foot: Secondary | ICD-10-CM | POA: Diagnosis present

## 2019-01-14 DIAGNOSIS — M19072 Primary osteoarthritis, left ankle and foot: Secondary | ICD-10-CM | POA: Diagnosis present

## 2019-01-14 DIAGNOSIS — E1043 Type 1 diabetes mellitus with diabetic autonomic (poly)neuropathy: Secondary | ICD-10-CM | POA: Diagnosis not present

## 2019-01-14 DIAGNOSIS — Z885 Allergy status to narcotic agent status: Secondary | ICD-10-CM

## 2019-01-14 DIAGNOSIS — I10 Essential (primary) hypertension: Secondary | ICD-10-CM | POA: Diagnosis not present

## 2019-01-14 LAB — CBC
HCT: 42.8 % (ref 36.0–46.0)
Hemoglobin: 12.7 g/dL (ref 12.0–15.0)
MCH: 26.3 pg (ref 26.0–34.0)
MCHC: 29.7 g/dL — ABNORMAL LOW (ref 30.0–36.0)
MCV: 88.6 fL (ref 80.0–100.0)
Platelets: 489 10*3/uL — ABNORMAL HIGH (ref 150–400)
RBC: 4.83 MIL/uL (ref 3.87–5.11)
RDW: 15 % (ref 11.5–15.5)
WBC: 8.3 10*3/uL (ref 4.0–10.5)
nRBC: 0 % (ref 0.0–0.2)

## 2019-01-14 LAB — COMPREHENSIVE METABOLIC PANEL
ALT: 12 U/L (ref 0–44)
AST: 18 U/L (ref 15–41)
Albumin: 4.4 g/dL (ref 3.5–5.0)
Alkaline Phosphatase: 109 U/L (ref 38–126)
Anion gap: 16 — ABNORMAL HIGH (ref 5–15)
BUN: 22 mg/dL — ABNORMAL HIGH (ref 6–20)
CO2: 21 mmol/L — ABNORMAL LOW (ref 22–32)
Calcium: 9.6 mg/dL (ref 8.9–10.3)
Chloride: 97 mmol/L — ABNORMAL LOW (ref 98–111)
Creatinine, Ser: 0.74 mg/dL (ref 0.44–1.00)
GFR calc Af Amer: >60 mL/min (ref >60)
GFR calc non Af Amer: >60 mL/min (ref >60)
Glucose, Bld: 156 mg/dL — ABNORMAL HIGH (ref 70–99)
Potassium: 3.7 mmol/L (ref 3.5–5.1)
Sodium: 134 mmol/L — ABNORMAL LOW (ref 135–145)
Total Bilirubin: 0.6 mg/dL (ref 0.3–1.2)
Total Protein: 8.9 g/dL — ABNORMAL HIGH (ref 6.5–8.1)

## 2019-01-14 LAB — I-STAT BETA HCG BLOOD, ED (MC, WL, AP ONLY): I-stat hCG, quantitative: <5 m[IU]/mL (ref <5)

## 2019-01-14 LAB — LIPASE, BLOOD: Lipase: 16 U/L (ref 11–51)

## 2019-01-14 MED ORDER — ONDANSETRON 4 MG PO TBDP
4.0000 mg | ORAL_TABLET | Freq: Once | ORAL | Status: DC
  Filled 2019-01-14: qty 1

## 2019-01-14 MED ORDER — SODIUM CHLORIDE 0.9% FLUSH
3.0000 mL | Freq: Once | INTRAVENOUS | Status: AC
  Administered 2019-01-15: 3 mL via INTRAVENOUS

## 2019-01-14 NOTE — ED Notes (Signed)
I was attempting to update her vital signs in the cubby hole out in the lobby. She was falling over out of the chair saying that she was going to pass out. [name] called her name to bring her back to a room and she got up all on her own and walked into the triage area.

## 2019-01-14 NOTE — ED Triage Notes (Signed)
Pt states that she is having nausea and vomiting. Pt states generalized abd pain as well.

## 2019-01-15 DIAGNOSIS — E1010 Type 1 diabetes mellitus with ketoacidosis without coma: Secondary | ICD-10-CM | POA: Diagnosis not present

## 2019-01-15 DIAGNOSIS — R112 Nausea with vomiting, unspecified: Secondary | ICD-10-CM | POA: Diagnosis not present

## 2019-01-15 DIAGNOSIS — K3184 Gastroparesis: Secondary | ICD-10-CM

## 2019-01-15 DIAGNOSIS — E1143 Type 2 diabetes mellitus with diabetic autonomic (poly)neuropathy: Secondary | ICD-10-CM | POA: Diagnosis not present

## 2019-01-15 LAB — RAPID URINE DRUG SCREEN, HOSP PERFORMED
Amphetamines: NOT DETECTED
Barbiturates: NOT DETECTED
Benzodiazepines: NOT DETECTED
Cocaine: NOT DETECTED
Opiates: NOT DETECTED
Tetrahydrocannabinol: NOT DETECTED

## 2019-01-15 LAB — BASIC METABOLIC PANEL
Anion gap: 11 (ref 5–15)
Anion gap: 7 (ref 5–15)
BUN: 29 mg/dL — ABNORMAL HIGH (ref 6–20)
BUN: 29 mg/dL — ABNORMAL HIGH (ref 6–20)
CO2: 18 mmol/L — ABNORMAL LOW (ref 22–32)
CO2: 21 mmol/L — ABNORMAL LOW (ref 22–32)
Calcium: 8.6 mg/dL — ABNORMAL LOW (ref 8.9–10.3)
Calcium: 8.5 mg/dL — ABNORMAL LOW (ref 8.9–10.3)
Chloride: 109 mmol/L (ref 98–111)
Chloride: 112 mmol/L — ABNORMAL HIGH (ref 98–111)
Creatinine, Ser: 0.82 mg/dL (ref 0.44–1.00)
Creatinine, Ser: 0.8 mg/dL (ref 0.44–1.00)
GFR calc Af Amer: >60 mL/min (ref >60)
GFR calc Af Amer: >60 mL/min (ref >60)
GFR calc non Af Amer: >60 mL/min (ref >60)
GFR calc non Af Amer: >60 mL/min (ref >60)
Glucose, Bld: 168 mg/dL — ABNORMAL HIGH (ref 70–99)
Glucose, Bld: 102 mg/dL — ABNORMAL HIGH (ref 70–99)
Potassium: 4.1 mmol/L (ref 3.5–5.1)
Potassium: 3.9 mmol/L (ref 3.5–5.1)
Sodium: 138 mmol/L (ref 135–145)
Sodium: 140 mmol/L (ref 135–145)

## 2019-01-15 LAB — SARS CORONAVIRUS 2 (TAT 6-24 HRS): SARS Coronavirus 2: NEGATIVE

## 2019-01-15 LAB — URINALYSIS, ROUTINE W REFLEX MICROSCOPIC
Bilirubin Urine: NEGATIVE
Glucose, UA: >=500 mg/dL — AB
Ketones, ur: 80 mg/dL — AB
Nitrite: NEGATIVE
Protein, ur: 100 mg/dL — AB
RBC / HPF: >50 RBC/hpf — ABNORMAL HIGH (ref 0–5)
Specific Gravity, Urine: 1.023 (ref 1.005–1.030)
WBC, UA: >50 WBC/hpf — ABNORMAL HIGH (ref 0–5)
pH: 6 (ref 5.0–8.0)

## 2019-01-15 LAB — I-STAT CHEM 8, ED
BUN: 25 mg/dL — ABNORMAL HIGH (ref 6–20)
Calcium, Ion: 1.13 mmol/L — ABNORMAL LOW (ref 1.15–1.40)
Chloride: 111 mmol/L (ref 98–111)
Creatinine, Ser: 0.7 mg/dL (ref 0.44–1.00)
Glucose, Bld: 441 mg/dL — ABNORMAL HIGH (ref 70–99)
HCT: 33 % — ABNORMAL LOW (ref 36.0–46.0)
Hemoglobin: 11.2 g/dL — ABNORMAL LOW (ref 12.0–15.0)
Potassium: 4.4 mmol/L (ref 3.5–5.1)
Sodium: 139 mmol/L (ref 135–145)
TCO2: 15 mmol/L — ABNORMAL LOW (ref 22–32)

## 2019-01-15 LAB — CBG MONITORING, ED
Glucose-Capillary: 124 mg/dL — ABNORMAL HIGH (ref 70–99)
Glucose-Capillary: 133 mg/dL — ABNORMAL HIGH (ref 70–99)
Glucose-Capillary: 139 mg/dL — ABNORMAL HIGH (ref 70–99)
Glucose-Capillary: 140 mg/dL — ABNORMAL HIGH (ref 70–99)
Glucose-Capillary: 197 mg/dL — ABNORMAL HIGH (ref 70–99)
Glucose-Capillary: 202 mg/dL — ABNORMAL HIGH (ref 70–99)
Glucose-Capillary: 256 mg/dL — ABNORMAL HIGH (ref 70–99)
Glucose-Capillary: 365 mg/dL — ABNORMAL HIGH (ref 70–99)
Glucose-Capillary: 391 mg/dL — ABNORMAL HIGH (ref 70–99)
Glucose-Capillary: 391 mg/dL — ABNORMAL HIGH (ref 70–99)
Glucose-Capillary: 409 mg/dL — ABNORMAL HIGH (ref 70–99)
Glucose-Capillary: 78 mg/dL (ref 70–99)
Glucose-Capillary: 92 mg/dL (ref 70–99)

## 2019-01-15 LAB — BLOOD GAS, VENOUS
Acid-base deficit: 13.4 mmol/L — ABNORMAL HIGH (ref 0.0–2.0)
Bicarbonate: 13.5 mmol/L — ABNORMAL LOW (ref 20.0–28.0)
O2 Saturation: 62 %
Patient temperature: 98.6
pCO2, Ven: 36.8 mmHg — ABNORMAL LOW (ref 44.0–60.0)
pH, Ven: 7.191 — CL (ref 7.250–7.430)
pO2, Ven: 44.5 mmHg (ref 32.0–45.0)

## 2019-01-15 LAB — BETA-HYDROXYBUTYRIC ACID: Beta-Hydroxybutyric Acid: 6.06 mmol/L — ABNORMAL HIGH (ref 0.05–0.27)

## 2019-01-15 LAB — GLUCOSE, CAPILLARY: Glucose-Capillary: 139 mg/dL — ABNORMAL HIGH (ref 70–99)

## 2019-01-15 MED ORDER — ONDANSETRON HCL 4 MG/2ML IJ SOLN
4.0000 mg | Freq: Four times a day (QID) | INTRAMUSCULAR | Status: DC | PRN
  Administered 2019-01-15 – 2019-01-18 (×7): 4 mg via INTRAVENOUS
  Filled 2019-01-15 (×10): qty 2

## 2019-01-15 MED ORDER — DICYCLOMINE HCL 20 MG PO TABS
20.0000 mg | ORAL_TABLET | Freq: Four times a day (QID) | ORAL | Status: DC | PRN
  Administered 2019-01-15 – 2019-01-17 (×6): 20 mg via ORAL
  Filled 2019-01-15 (×8): qty 1

## 2019-01-15 MED ORDER — MORPHINE SULFATE (PF) 2 MG/ML IV SOLN
2.0000 mg | INTRAVENOUS | Status: AC | PRN
  Administered 2019-01-15 – 2019-01-16 (×2): 4 mg via INTRAVENOUS
  Administered 2019-01-16: 3 mg via INTRAVENOUS
  Filled 2019-01-15: qty 2
  Filled 2019-01-15: qty 1
  Filled 2019-01-15: qty 2

## 2019-01-15 MED ORDER — ACETAMINOPHEN 325 MG PO TABS
650.0000 mg | ORAL_TABLET | Freq: Four times a day (QID) | ORAL | Status: DC | PRN
  Administered 2019-01-15 – 2019-01-17 (×5): 650 mg via ORAL
  Filled 2019-01-15 (×5): qty 2

## 2019-01-15 MED ORDER — SODIUM CHLORIDE 0.9 % IV SOLN: INTRAVENOUS | Status: DC

## 2019-01-15 MED ORDER — DEXTROSE-NACL 5-0.45 % IV SOLN: INTRAVENOUS | Status: DC

## 2019-01-15 MED ORDER — KETOROLAC TROMETHAMINE 30 MG/ML IJ SOLN
30.0000 mg | Freq: Once | INTRAMUSCULAR | Status: AC
  Administered 2019-01-15: 03:00:00 30 mg via INTRAMUSCULAR

## 2019-01-15 MED ORDER — HALOPERIDOL LACTATE 5 MG/ML IJ SOLN
2.0000 mg | Freq: Once | INTRAMUSCULAR | Status: AC | PRN
  Administered 2019-01-15: 2 mg via INTRAVENOUS
  Filled 2019-01-15: qty 1

## 2019-01-15 MED ORDER — SODIUM CHLORIDE 0.9 % IV SOLN: Freq: Once | INTRAVENOUS | Status: AC

## 2019-01-15 MED ORDER — TRAZODONE HCL 50 MG PO TABS
25.0000 mg | ORAL_TABLET | Freq: Every evening | ORAL | Status: DC | PRN
  Administered 2019-01-17: 22:00:00 50 mg via ORAL
  Filled 2019-01-15: qty 1

## 2019-01-15 MED ORDER — CHLORHEXIDINE GLUCONATE CLOTH 2 % EX PADS
6.0000 | MEDICATED_PAD | Freq: Every day | CUTANEOUS | Status: DC
  Administered 2019-01-15 – 2019-01-20 (×4): 6 via TOPICAL

## 2019-01-15 MED ORDER — ACETAMINOPHEN 650 MG RE SUPP: 650.0000 mg | Freq: Four times a day (QID) | RECTAL | Status: DC | PRN

## 2019-01-15 MED ORDER — ONDANSETRON HCL 4 MG PO TABS
4.0000 mg | ORAL_TABLET | Freq: Four times a day (QID) | ORAL | Status: DC | PRN
  Administered 2019-01-16: 4 mg via ORAL
  Filled 2019-01-15 (×2): qty 1

## 2019-01-15 MED ORDER — KETOROLAC TROMETHAMINE 30 MG/ML IJ SOLN
30.0000 mg | Freq: Once | INTRAMUSCULAR | Status: DC
  Filled 2019-01-15: qty 1

## 2019-01-15 MED ORDER — DEXTROSE 50 % IV SOLN: 0.0000 mL | INTRAVENOUS | Status: DC | PRN

## 2019-01-15 MED ORDER — PANTOPRAZOLE SODIUM 40 MG IV SOLR
40.0000 mg | Freq: Once | INTRAVENOUS | Status: AC
  Administered 2019-01-15: 40 mg via INTRAVENOUS
  Filled 2019-01-15: qty 40

## 2019-01-15 MED ORDER — LORAZEPAM 2 MG/ML IJ SOLN
2.0000 mg | Freq: Once | INTRAMUSCULAR | Status: AC
  Administered 2019-01-15: 2 mg via INTRAMUSCULAR

## 2019-01-15 MED ORDER — POTASSIUM CHLORIDE 10 MEQ/100ML IV SOLN
10.0000 meq | INTRAVENOUS | Status: AC
  Administered 2019-01-15 (×2): 10 meq via INTRAVENOUS
  Filled 2019-01-15: qty 100

## 2019-01-15 MED ORDER — ALBUTEROL SULFATE HFA 108 (90 BASE) MCG/ACT IN AERS: 1.0000 | INHALATION_SPRAY | Freq: Four times a day (QID) | RESPIRATORY_TRACT | Status: DC | PRN

## 2019-01-15 MED ORDER — INSULIN ASPART 100 UNIT/ML ~~LOC~~ SOLN
0.0000 [IU] | Freq: Every day | SUBCUTANEOUS | Status: DC
  Administered 2019-01-17: 2 [IU] via SUBCUTANEOUS
  Administered 2019-01-19: 3 [IU] via SUBCUTANEOUS
  Filled 2019-01-15: qty 0.05

## 2019-01-15 MED ORDER — ENOXAPARIN SODIUM 40 MG/0.4ML ~~LOC~~ SOLN
40.0000 mg | SUBCUTANEOUS | Status: DC
  Administered 2019-01-15 – 2019-01-20 (×6): 40 mg via SUBCUTANEOUS
  Filled 2019-01-15 (×7): qty 0.4

## 2019-01-15 MED ORDER — INSULIN REGULAR(HUMAN) IN NACL 100-0.9 UT/100ML-% IV SOLN
INTRAVENOUS | Status: DC
  Administered 2019-01-15: 8 [IU]/h via INTRAVENOUS
  Filled 2019-01-15: qty 100

## 2019-01-15 MED ORDER — LORAZEPAM 2 MG/ML IJ SOLN
1.0000 mg | Freq: Once | INTRAMUSCULAR | Status: DC
  Filled 2019-01-15: qty 1

## 2019-01-15 MED ORDER — INSULIN GLARGINE 100 UNIT/ML ~~LOC~~ SOLN
25.0000 [IU] | Freq: Every day | SUBCUTANEOUS | Status: DC
  Administered 2019-01-15: 25 [IU] via SUBCUTANEOUS
  Filled 2019-01-15 (×2): qty 0.25

## 2019-01-15 MED ORDER — KETOROLAC TROMETHAMINE 30 MG/ML IJ SOLN
15.0000 mg | Freq: Four times a day (QID) | INTRAMUSCULAR | Status: DC | PRN
  Administered 2019-01-15 (×3): 15 mg via INTRAMUSCULAR
  Filled 2019-01-15 (×3): qty 1

## 2019-01-15 MED ORDER — INSULIN ASPART 100 UNIT/ML ~~LOC~~ SOLN
0.0000 [IU] | Freq: Three times a day (TID) | SUBCUTANEOUS | Status: DC
  Administered 2019-01-16: 3 [IU] via SUBCUTANEOUS
  Administered 2019-01-16: 2 [IU] via SUBCUTANEOUS
  Administered 2019-01-17: 3 [IU] via SUBCUTANEOUS
  Administered 2019-01-17: 5 [IU] via SUBCUTANEOUS
  Administered 2019-01-18: 09:00:00 2 [IU] via SUBCUTANEOUS
  Administered 2019-01-18: 5 [IU] via SUBCUTANEOUS
  Filled 2019-01-15: qty 0.15

## 2019-01-15 MED ORDER — DICYCLOMINE HCL 10 MG/ML IM SOLN
10.0000 mg | Freq: Once | INTRAMUSCULAR | Status: AC
  Administered 2019-01-15: 06:00:00 10 mg via INTRAMUSCULAR
  Filled 2019-01-15: qty 2

## 2019-01-15 MED ORDER — METOCLOPRAMIDE HCL 5 MG/ML IJ SOLN
10.0000 mg | Freq: Three times a day (TID) | INTRAMUSCULAR | Status: DC
  Administered 2019-01-15 – 2019-01-19 (×14): 10 mg via INTRAVENOUS
  Filled 2019-01-15 (×16): qty 2

## 2019-01-15 MED ORDER — PROMETHAZINE HCL 25 MG/ML IJ SOLN
12.5000 mg | Freq: Four times a day (QID) | INTRAMUSCULAR | Status: DC | PRN
  Administered 2019-01-15 – 2019-01-19 (×10): 12.5 mg via INTRAVENOUS
  Filled 2019-01-15 (×11): qty 1

## 2019-01-15 MED ORDER — SODIUM CHLORIDE 0.9 % IV BOLUS
2000.0000 mL | Freq: Once | INTRAVENOUS | Status: AC
  Administered 2019-01-15: 2000 mL via INTRAVENOUS

## 2019-01-15 MED ORDER — SODIUM CHLORIDE 0.9 % IV BOLUS: 1000.0000 mL | Freq: Once | INTRAVENOUS | Status: DC

## 2019-01-15 MED ORDER — ALBUTEROL SULFATE (2.5 MG/3ML) 0.083% IN NEBU: 2.5000 mg | INHALATION_SOLUTION | Freq: Four times a day (QID) | RESPIRATORY_TRACT | Status: DC | PRN

## 2019-01-15 MED ORDER — PANTOPRAZOLE SODIUM 40 MG IV SOLR
40.0000 mg | INTRAVENOUS | Status: DC
  Administered 2019-01-16 – 2019-01-19 (×4): 40 mg via INTRAVENOUS
  Filled 2019-01-15 (×5): qty 40

## 2019-01-15 MED ORDER — PROMETHAZINE HCL 25 MG/ML IJ SOLN
12.5000 mg | INTRAMUSCULAR | Status: AC
  Administered 2019-01-15: 12.5 mg via INTRAVENOUS
  Filled 2019-01-15: qty 1

## 2019-01-15 NOTE — ED Notes (Signed)
Pt asked to provide urine specimen but states she is unable to void at this time.

## 2019-01-15 NOTE — ED Notes (Signed)
Pt denies needing anything at this time. Pt has call bell and phone within reach.

## 2019-01-15 NOTE — ED Notes (Signed)
Gave pt new bed linens due to sheets being soiled. Pt ambulated to BR with steady gait.

## 2019-01-15 NOTE — H&P (Addendum)
History and Physical    [name] ZNB:[id number] DOB: January 23, 1990 DOA: 01/14/2019  Referring Braun/NP/PA: EDP PCP: [name], GI Dr. Paulita Braun Patient coming from:   Chief Complaint: Nausea and vomiting  HPI: [name] is a 28 y.o. female with medical history significant of type 1 diabetes mellitus, long history of diabetic gastroparesis, asthma, anxiety, frequent hospitalizations for nausea vomiting/gastroparesis and sometimes DKA presents to the ED with worsening nausea and vomiting starting on Friday 12/25. -Recently hospitalized few weeks ago for same, CT abdomen pelvis at the time was concerning for possible pyelonephritis however urine culture and clinical symptoms did not correlate with same. -When her symptoms started 2 days back she attempted to take Phenergan and Zofran, without relief of her symptoms and eventually ended up coming to the ED overnight. ED Course: Labs noted hyperglycemia with mildly elevated anion gap of 16, with bicarb of 21, in the course of 6 hours in the ED she had worsening of her bicarb down to 15, anion gap was 15, beta hydroxybutyric acid was elevated at 6.0, she was subsequently started on an insulin drip and [facility] was consulted, UA was cloudy with large leukocyte esterase greater than 500 glucose rare bacteria, greater than 50 RBCs  Review of Systems: As per HPI otherwise 14 point review of systems negative.   Past Medical History:  Diagnosis Date  . Anxiety   . Arthritis    "hands, feet, knees" (12/18/2016)  . Asthma   . Diabetic gastroparesis ([place])    Per gastric emptying study 07/09/16 which showed significant delayed gastric emptying.  . Gallstones   . Gastroparesis   . GERD (gastroesophageal reflux disease)   . Heart murmur   . Hepatic steatosis 11/26/2014   and hepatomegaly  . Hypertension    hx (12/18/2016)  . Intractable cyclical vomiting syndrome    [name] 12/18/2016  . Liver mass 11/26/2014  . Pancreatitis, acute 11/26/2014   . Pneumonia    "as a teen X 1" (12/18/2016)  . Type I diabetes mellitus ([place]) 2007   IDDM.  poorly controlled, multiple admits with DKA    Past Surgical History:  Procedure Laterality Date  . CHOLECYSTECTOMY N/A 02/11/2015   Procedure: LAPAROSCOPIC CHOLECYSTECTOMY WITH INTRAOPERATIVE CHOLANGIOGRAM;  Surgeon: Stefanie Pickerel, Braun;  Location: [facility];  Service: General;  Laterality: N/A;  . ESOPHAGOGASTRODUODENOSCOPY (EGD) WITH PROPOFOL Left 09/20/2014   Procedure: ESOPHAGOGASTRODUODENOSCOPY (EGD) WITH PROPOFOL;  Surgeon: Stefanie Silence, Braun;  Location: [facility] ENDOSCOPY;  Service: Endoscopy;  Laterality: Left;  . WISDOM TOOTH EXTRACTION       reports that she has never smoked. She has never used smokeless tobacco. She reports current drug use. Drug: Marijuana. She reports that she does not drink alcohol.  Allergies  Allergen Reactions  . Peanut-Containing Drug Products Swelling and Other (See Comments)    Reaction:  Swelling of mouth and lips   . 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   Reaction:  Swelling of mouth and lips   . Ultram [Tramadol] Itching    Family History  Problem Relation Age of Onset  . Heart disease Maternal Grandmother   . Heart disease Maternal Grandfather   . Diabetes Mother   . Hyperlipidemia Mother   . Hypertension Father   . Heart disease Father   . Hypertension Paternal Grandmother   . Cancer Paternal Grandfather      Prior to Admission medications   Medication Sig Start Date End Date Taking? Authorizing Provider  albuterol (  PROVENTIL HFA;VENTOLIN HFA) 108 (90 Base) MCG/ACT inhaler Inhale 1-2 puffs into the lungs every 6 (six) hours as needed for wheezing or shortness of breath.    Provider, Historical, Braun  blood glucose meter kit and supplies Dispense based on patient and insurance preference. Use up to four times daily as directed. (FOR ICD-10 E10.9, E11.9). 12/07/17   Stefanie Filler, Braun  dicyclomine (BENTYL) 20 MG  tablet Take 1 tablet (20 mg total) by mouth every 8 (eight) hours as needed for spasms. 12/31/18   [name], Stefanie R, PA-C  glucagon (GLUCAGON EMERGENCY) 1 MG injection Inject 1 mg into the muscle once as needed (For very low blood sugars. May repeat in 15-20 minutes if needed.). 12/20/16   [name]  HYDROcodone-acetaminophen (NORCO/VICODIN) 5-325 MG tablet Take 1 tablet by mouth every 6 (six) hours as needed for moderate pain. 12/21/18   Stefanie Ferguson, Braun  Insulin Pen Needle 31G X 5 MM MISC 30 Units by Does not apply route at bedtime. 12/07/17   Stefanie Filler, Braun  lisinopril (ZESTRIL) 2.5 MG tablet Take 2.5 mg by mouth daily.    Provider, Historical, Braun  metoCLOPramide (REGLAN) 10 MG tablet Take 1 tablet (10 mg total) by mouth 3 (three) times daily with meals. 11/18/18 11/18/19  Stefanie Dike, Braun  naproxen (NAPROSYN) 375 MG tablet Take 1 tablet (375 mg total) by mouth 2 (two) times daily as needed. Patient taking differently: Take 375 mg by mouth 2 (two) times daily as needed for moderate pain.  12/12/18   Albrizze, Kaitlyn E, PA-C  NOVOLOG 100 UNIT/ML injection Inject 50 Units into the skin as directed. Inject up to 50 units/day. 12/08/18   Provider, Historical, Braun  ondansetron (ZOFRAN ODT) 4 MG disintegrating tablet Take 1 tablet (4 mg total) by mouth every 8 (eight) hours as needed for nausea or vomiting. 11/18/18   Stefanie Dike, Braun  potassium chloride 20 MEQ TBCR Take 20 mEq by mouth daily for 5 days. 12/30/18 01/04/19  Stefanie Coss, Braun  promethazine (PHENERGAN) 12.5 MG tablet Take 1 tablet (12.5 mg total) by mouth every 6 (six) hours as needed for nausea or vomiting. 12/31/18   [name], Glynda Jaeger, PA-C  traZODone (DESYREL) 50 MG tablet Take 25-50 mg by mouth at bedtime as needed for sleep.  11/30/18   Provider, Historical, Braun  trimethoprim (TRIMPEX) 100 MG tablet Take 100 mg by mouth daily. 11/11/18   Provider, Historical, Braun    Physical Exam: Vitals:    01/15/19 0515 01/15/19 0600 01/15/19 0633 01/15/19 0645  BP: (!) 147/97 (!) 121/52  (!) 101/43  Pulse:  (!) 131    Resp:  16  18  Temp:   98.3 F (36.8 C)   TempSrc:   Oral   SpO2:  100%    Weight:      Height:          Constitutional: Chronically ill African-American female laying in bed, uncomfortable appearing twisting and turning Vitals:   01/15/19 0515 01/15/19 0600 01/15/19 0633 01/15/19 0645  BP: (!) 147/97 (!) 121/52  (!) 101/43  Pulse:  (!) 131    Resp:  16  18  Temp:   98.3 F (36.8 C)   TempSrc:   Oral   SpO2:  100%    Weight:      Height:       HEENT: Pupils equal reactive oral mucosa moist and pink Respiratory: Clear Cardiovascular: S1-S2, regular rhythm, tachycardic  abdomen: soft, non tender, Bowel  sounds positive.  Musculoskeletal: No joint deformity upper and lower extremities. Ext: No edema Skin: no rashes, lesions, ulcers.  Neurologic: Moves all extremities, no localizing signs Psychiatric: Flat affect  Labs on Admission: I have personally reviewed following labs and imaging studies  CBC: Recent Labs  Lab 01/14/19 1952 01/15/19 0626  WBC 8.3  --   HGB 12.7 11.2*  HCT 42.8 33.0*  MCV 88.6  --   PLT 489*  --    Basic Metabolic Panel: Recent Labs  Lab 01/14/19 1952 01/15/19 0626  NA 134* 139  K 3.7 4.4  CL 97* 111  CO2 21*  --   GLUCOSE 156* 441*  BUN 22* 25*  CREATININE 0.74 0.70  CALCIUM 9.6  --    GFR: Estimated Creatinine Clearance: 85.8 mL/min (by C-G formula based on SCr of 0.7 mg/dL). Liver Function Tests: Recent Labs  Lab 01/14/19 1952  AST 18  ALT 12  ALKPHOS 109  BILITOT 0.6  PROT 8.9*  ALBUMIN 4.4   Recent Labs  Lab 01/14/19 1952  LIPASE 16   No results for input(s): AMMONIA in the last 168 hours. Coagulation Profile: No results for input(s): INR, PROTIME in the last 168 hours. Cardiac Enzymes: No results for input(s): CKTOTAL, CKMB, CKMBINDEX, TROPONINI in the last 168 hours. BNP (last 3 results) No  results for input(s): PROBNP in the last 8760 hours. HbA1C: No results for input(s): HGBA1C in the last 72 hours. CBG: Recent Labs  Lab 01/15/19 0047 01/15/19 0508 01/15/19 0656  GLUCAP 391* 409* 391*   Lipid Profile: No results for input(s): CHOL, HDL, LDLCALC, TRIG, CHOLHDL, LDLDIRECT in the last 72 hours. Thyroid Function Tests: No results for input(s): TSH, T4TOTAL, FREET4, T3FREE, THYROIDAB in the last 72 hours. Anemia Panel: No results for input(s): VITAMINB12, FOLATE, FERRITIN, TIBC, IRON, RETICCTPCT in the last 72 hours. Urine analysis:    Component Value Date/Time   COLORURINE YELLOW 01/15/2019 0042   APPEARANCEUR CLOUDY (A) 01/15/2019 0042   LABSPEC 1.023 01/15/2019 0042   PHURINE 6.0 01/15/2019 0042   GLUCOSEU >=500 (A) 01/15/2019 0042   GLUCOSEU >=1000 11/07/2012 1205   HGBUR MODERATE (A) 01/15/2019 0042   BILIRUBINUR NEGATIVE 01/15/2019 0042   KETONESUR 80 (A) 01/15/2019 0042   PROTEINUR 100 (A) 01/15/2019 0042   UROBILINOGEN 0.2 11/24/2014 1045   NITRITE NEGATIVE 01/15/2019 0042   LEUKOCYTESUR LARGE (A) 01/15/2019 0042   Sepsis Labs: _0 (procalcitonin:4,lacticidven:4) )No results found for this or any previous visit (from the past 240 hour(s)).   Radiological Exams on Admission: No results found.   Assessment/Plan  Recurrent nausea and vomiting -Likely secondary to severe diabetic gastroparesis and mild DKA -Long history of same with frequent hospitalizations, last gastric emptying study in 2018 noted severe gastroparesis, abdominal exam is benign, CT few weeks ago was relatively unremarkable -Treat DKA, supportive care with scheduled IV Reglan, as needed Zofran/Phenergan -IV Toradol, PPI, antispasmodics-dicyclomine -Clear liquids, advance as tolerated  Mild DKA -Anion gap is minimally elevated, already started on insulin drip in the ED, anticipate immediate correction of DKA, continue IV fluids/insulin drip per Braun tool protocol -Bmet every  4 hours  Type 1 diabetes mellitus -Hemoglobin A1c was 7.6 in November -At home takes Levemir 30 units and NovoLog sliding scale, currently on insulin drip  Hematuria -Etiology unclear, no symptoms of UTI at this time, urine culture few weeks ago grew 20,000 colonies of strep, no indication for antibiotics at this time, if she develops symptoms start antibiotics and follow urine cultures  Essential hypertension -Hold lisinopril, BP is soft at this time  COVID-19 PCR screening is pending at this time  DVT prophylaxis: Lovenox Code Status: Full code Family Communication: No family at bedside Disposition Plan: home pending clinical improvement Consults called: none Admission status: observation  Domenic Polite Braun [facility]   01/15/2019, 7:50 AM

## 2019-01-15 NOTE — ED Notes (Signed)
Date and time results received: 01/15/19 6:33 AM  (use smartphrase ".now" to insert current time)  Test: VBG pH Critical Value: 7.1  Name of Provider Notified: Fredderick Phenix. PA  Orders Received? Or Actions Taken?:none

## 2019-01-15 NOTE — Plan of Care (Signed)
28 yo female with asthma,  hypertension,  dm1, w gastroparesis, gerd, w recent admission for DKA  apparently presents with c/o n/v, and noted to be in DKA  Wbc 8.3, Hgb 12.7, Plt 489 Na 134, K 3.7, Bun 22, Creatinine 0.74 AG   16, Hco3 21  Urinalysis + ketone, Wbc >50, rbc > 50,    ABG pH 7.191,  ED requesting admission for acute lower uti, n/v, and DKA.

## 2019-01-15 NOTE — ED Notes (Addendum)
IV team still at bedside

## 2019-01-15 NOTE — ED Provider Notes (Signed)
[place] DEPT Provider Note   CSN: [id number] Arrival date & time: 01/14/19  1546     History Chief Complaint  Patient presents with  . Emesis    [name] is a 28 y.o. female.  28 y/o female with hx of IDDM, diabetic gastroparesis, HTN, GERD presents to the emergency department complaining of symptoms consistent with past episodes of gastroparesis.  Reports symptom onset yesterday.  Reports pain, nausea, vomiting have remained constant.  Abdominal pain is generalized.  Emesis streaked with blood at times; TNTC episodes today.  She tried taking Phenergan for symptoms without relief.  Did have a normal bowel movement yesterday.  Denies fevers, dysuria, hematuria, melena, hematochezia.  Last menstrual period in November.  Endorses history of irregular menses.  Abdominal surgical history significant for cholecystectomy.  She is followed by Dr. Paulita Fujita with gastroenterology.    Last assessed in the ED on 12/31/18 for similar complaints.  Had abdominal CT on 12/28/18 which was negative for acute/emergent process.  The history is provided by the patient. No language interpreter was used.  Emesis      Past Medical History:  Diagnosis Date  . Anxiety   . Arthritis    "hands, feet, knees" (12/18/2016)  . Asthma   . Diabetic gastroparesis ([place])    Per gastric emptying study 07/09/16 which showed significant delayed gastric emptying.  . Gallstones   . Gastroparesis   . GERD (gastroesophageal reflux disease)   . Heart murmur   . Hepatic steatosis 11/26/2014   and hepatomegaly  . Hypertension    hx (12/18/2016)  . Intractable cyclical vomiting syndrome    Archie Endo 12/18/2016  . Liver mass 11/26/2014  . Pancreatitis, acute 11/26/2014  . Pneumonia    "as a teen X 1" (12/18/2016)  . Type I diabetes mellitus ([place]) 2007   IDDM.  poorly controlled, multiple admits with DKA    Patient Active Problem List   Diagnosis Date Noted  . Gastroparesis due  to secondary diabetes ([place]) 11/21/2018  . Diabetes mellitus ([place]) 11/13/2018  . HTN (hypertension) 11/13/2018  . Marijuana abuse 11/13/2018  . Acute pyelonephritis 10/07/2018  . Hydronephrosis of right kidney 10/05/2018  . Diffuse pain   . Elevated blood pressure reading 12/03/2017  . Tachycardia 11/30/2017  . Hypernatremia 11/02/2017  . Nausea and vomiting 01/22/2017  . Nausea & vomiting 01/21/2017  . Intractable cyclical vomiting syndrome 12/18/2016  . Leukocytosis 10/24/2016  . N&V (nausea and vomiting) 10/23/2016  . DKA, type 1 ([place]) 09/26/2016  . Thrombocytosis ([place]) 07/08/2016  . Candiduria, asymptomatic 06/23/2016  . Hyponatremia 06/13/2016  . Hematuria 05/13/2016  . UTI (urinary tract infection) 01/22/2016  . Diarrhea 11/09/2015  . Acute urinary retention   . Gastroparesis due to DM ([place]) 07/10/2015  . GERD (gastroesophageal reflux disease) 07/10/2015  . Depression with anxiety 07/10/2015  . Gastroparesis 06/22/2015  . Altered mental status 06/22/2015  . Volume depletion 06/10/2015  . Protein-calorie malnutrition, severe 06/10/2015  . Hyperglycemia   . Elevated bilirubin   . Hematemesis with nausea   . Intractable nausea and vomiting 05/28/2015  . Abdominal pain in female   . Abdominal pain 05/24/2015  . Hypertension 05/24/2015  . Dehydration   . Diabetic gastroparesis ([place])   . Chronic diastolic heart failure ([place]) 04/11/2015  . Hematemesis 04/08/2015  . DKA (diabetic ketoacidoses) ([place]) 03/22/2015  . S/P laparoscopic cholecystectomy 02/11/2015  . Postextubation stridor   . Pancreatitis, acute 11/26/2014  . Volume overload 11/26/2014  . Hypokalemia 11/26/2014  .  Hepatic steatosis 11/26/2014  . Liver mass 11/26/2014  . Sepsis ([place]) 11/25/2014  . Sinus tachycardia 11/25/2014  . Hypomagnesemia 11/25/2014  . Hypophosphatemia 11/25/2014  . Elevated LFTs 11/24/2014  . AKI (acute kidney injury) ([place]) 11/24/2014  . Migraine headache 11/24/2014  . Asthma  06/29/2012  . Uncontrolled type 1 diabetes mellitus ([place]) 06/19/2010  . Goiter, unspecified 06/19/2010    Past Surgical History:  Procedure Laterality Date  . CHOLECYSTECTOMY N/A 02/11/2015   Procedure: LAPAROSCOPIC CHOLECYSTECTOMY WITH INTRAOPERATIVE CHOLANGIOGRAM;  Surgeon: Greer Pickerel, MD;  Location: [facility];  Service: General;  Laterality: N/A;  . ESOPHAGOGASTRODUODENOSCOPY (EGD) WITH PROPOFOL Left 09/20/2014   Procedure: ESOPHAGOGASTRODUODENOSCOPY (EGD) WITH PROPOFOL;  Surgeon: Arta Silence, MD;  Location: [facility] ENDOSCOPY;  Service: Endoscopy;  Laterality: Left;  . WISDOM TOOTH EXTRACTION       OB History    Gravida  2   Para  1   Term  0   Preterm  1   AB  1   Living  1     SAB  0   TAB  1   Ectopic  0   Multiple  0   Live Births  1           Family History  Problem Relation Age of Onset  . Heart disease Maternal Grandmother   . Heart disease Maternal Grandfather   . Diabetes Mother   . Hyperlipidemia Mother   . Hypertension Father   . Heart disease Father   . Hypertension Paternal Grandmother   . Cancer Paternal Grandfather     Social History   Tobacco Use  . Smoking status: Never Smoker  . Smokeless tobacco: Never Used  Substance Use Topics  . Alcohol use: No  . Drug use: Yes    Types: Marijuana    Home Medications Prior to Admission medications   Medication Sig Start Date End Date Taking? Authorizing Provider  albuterol (PROVENTIL HFA;VENTOLIN HFA) 108 (90 Base) MCG/ACT inhaler Inhale 1-2 puffs into the lungs every 6 (six) hours as needed for wheezing or shortness of breath.    [provider]  blood glucose meter kit and supplies Dispense based on patient and insurance preference. Use up to four times daily as directed. (FOR ICD-10 E10.9, E11.9). 12/07/17   Eugenie Filler, MD  dicyclomine (BENTYL) 20 MG tablet Take 1 tablet (20 mg total) by mouth every 8 (eight) hours as needed for spasms. 12/31/18   Petrucelli, Samantha R, PA-C   glucagon (GLUCAGON EMERGENCY) 1 MG injection Inject 1 mg into the muscle once as needed (For very low blood sugars. May repeat in 15-20 minutes if needed.). 12/20/16   Hongalgi, Lenis Dickinson, MD  HYDROcodone-acetaminophen (NORCO/VICODIN) 5-325 MG tablet Take 1 tablet by mouth every 6 (six) hours as needed for moderate pain. 12/21/18   Milton Ferguson, MD  Insulin Pen Needle 31G X 5 MM MISC 30 Units by Does not apply route at bedtime. 12/07/17   Eugenie Filler, MD  lisinopril (ZESTRIL) 2.5 MG tablet Take 2.5 mg by mouth daily.    [provider]  metoCLOPramide (REGLAN) 10 MG tablet Take 1 tablet (10 mg total) by mouth 3 (three) times daily with meals. 11/18/18 11/18/19  Kathie Dike, MD  naproxen (NAPROSYN) 375 MG tablet Take 1 tablet (375 mg total) by mouth 2 (two) times daily as needed. Patient taking differently: Take 375 mg by mouth 2 (two) times daily as needed for moderate pain.  12/12/18   Albrizze, Harley Hallmark, PA-C  NOVOLOG 100 UNIT/ML injection Inject 50 Units into the skin as directed. Inject up to 50 units/day. 12/08/18   [provider]  ondansetron (ZOFRAN ODT) 4 MG disintegrating tablet Take 1 tablet (4 mg total) by mouth every 8 (eight) hours as needed for nausea or vomiting. 11/18/18   Kathie Dike, MD  potassium chloride 20 MEQ TBCR Take 20 mEq by mouth daily for 5 days. 12/30/18 01/04/19  Shelly Coss, MD  promethazine (PHENERGAN) 12.5 MG tablet Take 1 tablet (12.5 mg total) by mouth every 6 (six) hours as needed for nausea or vomiting. 12/31/18   Petrucelli, Glynda Jaeger, PA-C  traZODone (DESYREL) 50 MG tablet Take 25-50 mg by mouth at bedtime as needed for sleep.  11/30/18   [provider]  trimethoprim (TRIMPEX) 100 MG tablet Take 100 mg by mouth daily. 11/11/18   [provider]    Allergies    Peanut-containing drug products, Food, and Ultram [tramadol]  Review of Systems   Review of Systems  Gastrointestinal: Positive for vomiting.   Ten systems reviewed and are negative for acute change, except as noted in the HPI.    Physical Exam Updated Vital Signs BP (!) 121/52 (BP Location: Left Arm)   Pulse (!) 131   Temp 98.3 F (36.8 C) (Oral)   Resp (!) 21   Ht 5' 3"  (1.6 m)   Wt 52.9 kg   LMP 12/18/2018 Comment: neg preg test  SpO2 100%   BMI 20.66 kg/m   Physical Exam Vitals and nursing note reviewed.  Constitutional:      General: She is not in acute distress.    Appearance: She is well-developed. She is not diaphoretic.     Comments: Writhing around in the exam room bed. Dry heaves intermittently.  HENT:     Head: Normocephalic and atraumatic.  Eyes:     General: No scleral icterus.    Conjunctiva/sclera: Conjunctivae normal.  Pulmonary:     Effort: Pulmonary effort is normal. No respiratory distress.     Comments: Respirations even and unlabored Abdominal:     Palpations: Abdomen is soft.     Tenderness: There is no guarding.     Comments: Soft, nondistended abdomen. No focal TTP. No guarding on exam.  Musculoskeletal:        General: Normal range of motion.     Cervical back: Normal range of motion.  Skin:    General: Skin is warm and dry.     Coloration: Skin is not pale.     Findings: No erythema or rash.  Neurological:     Mental Status: She is alert and oriented to person, place, and time.     Coordination: Coordination normal.     Comments: Moves extremities vigorously  Psychiatric:        Mood and Affect: Affect is inappropriate.        Speech: Speech normal.        Behavior: Behavior is uncooperative and agitated.     ED Results / Procedures / Treatments   Labs (all labs ordered are listed, but only abnormal results are displayed) Labs Reviewed  COMPREHENSIVE METABOLIC PANEL - Abnormal; Notable for the following components:      Result Value   Sodium 134 (*)    Chloride 97 (*)    CO2 21 (*)    Glucose, Bld 156 (*)    BUN 22 (*)    Total Protein 8.9 (*)    Anion gap 16 (*)  All other components within normal limits  CBC - Abnormal; Notable for the following components:   MCHC 29.7 (*)    Platelets 489 (*)    All other components within normal limits  URINALYSIS, ROUTINE W REFLEX MICROSCOPIC - Abnormal; Notable for the following components:   APPearance CLOUDY (*)    Glucose, UA >=500 (*)    Hgb urine dipstick MODERATE (*)    Ketones, ur 80 (*)    Protein, ur 100 (*)    Leukocytes,Ua LARGE (*)    RBC / HPF >50 (*)    WBC, UA >50 (*)    Bacteria, UA RARE (*)    Non Squamous Epithelial 0-5 (*)    All other components within normal limits  BLOOD GAS, VENOUS - Abnormal; Notable for the following components:   pH, Ven 7.191 (*)    pCO2, Ven 36.8 (*)    Bicarbonate 13.5 (*)    Acid-base deficit 13.4 (*)    All other components within normal limits  CBG MONITORING, ED - Abnormal; Notable for the following components:   Glucose-Capillary 391 (*)    All other components within normal limits  CBG MONITORING, ED - Abnormal; Notable for the following components:   Glucose-Capillary 409 (*)    All other components within normal limits  I-STAT CHEM 8, ED - Abnormal; Notable for the following components:   BUN 25 (*)    Glucose, Bld 441 (*)    Calcium, Ion 1.13 (*)    TCO2 15 (*)    Hemoglobin 11.2 (*)    HCT 33.0 (*)    All other components within normal limits  URINE CULTURE  LIPASE, BLOOD  RAPID URINE DRUG SCREEN, HOSP PERFORMED  BETA-HYDROXYBUTYRIC ACID  BETA-HYDROXYBUTYRIC ACID  BETA-HYDROXYBUTYRIC ACID  I-STAT BETA HCG BLOOD, ED ([facility], [facility], [facility] ONLY)  CBG MONITORING, ED    EKG None  Radiology No results found.  Procedures .Critical Care Performed by: Antonietta Breach, PA-C Authorized by: Antonietta Breach, PA-C   Critical care provider statement:    Critical care time (minutes):  45   Critical care was time spent personally by me on the following activities:  Discussions with consultants, evaluation of patient's response to treatment, examination  of patient, ordering and performing treatments and interventions, ordering and review of laboratory studies, ordering and review of radiographic studies, pulse oximetry, re-evaluation of patient's condition, obtaining history from patient or surrogate and review of old charts   (including critical care time)  Medications Ordered in ED Medications  insulin regular, human (MYXREDLIN) 100 units/ 100 mL infusion (has no administration in time range)  0.9 %  sodium chloride infusion (has no administration in time range)  dextrose 5 %-0.45 % sodium chloride infusion (has no administration in time range)  dextrose 50 % solution 0-50 mL (has no administration in time range)  potassium chloride 10 mEq in 100 mL IVPB (10 mEq Intravenous New Bag/Given 01/15/19 0606)  sodium chloride flush (NS) 0.9 % injection 3 mL (3 mLs Intravenous Given 01/15/19 0423)  pantoprazole (PROTONIX) injection 40 mg (40 mg Intravenous Given 01/15/19 0411)  sodium chloride 0.9 % bolus 2,000 mL (2,000 mLs Intravenous New Bag/Given 01/15/19 0411)  ketorolac (TORADOL) 30 MG/ML injection 30 mg (30 mg Intramuscular Given 01/15/19 0306)  LORazepam (ATIVAN) injection 2 mg (2 mg Intramuscular Given 01/15/19 0304)  promethazine (PHENERGAN) injection 12.5 mg (12.5 mg Intravenous Given 01/15/19 0423)  dicyclomine (BENTYL) injection 10 mg (10 mg Intramuscular Given 01/15/19 0546)  haloperidol lactate (HALDOL)  injection 2 mg (2 mg Intravenous Given 01/15/19 0546)    ED Course  I have reviewed the triage vital signs and the nursing notes.  Pertinent labs & imaging results that were available during my care of the patient were reviewed by me and considered in my medical decision making (see chart for details).  See A/P for detailed course.     MDM Rules/Calculators/A&P                       12:35 AM Patient presenting c/o abdominal pain, N/V associated with diabetic gastroparesis. No focal abdominal TTP. Labs generally reassuring.  Plan for IVF hydration, IV medications for symptom control.   Consult placed to IV team for peripheral access as RN unable to obtain IV at this time.   2:00 AM IV team at bedside. RN reports IV team having difficulty obtaining peripheral site. Encouraged charge RN to be notified who is also trained in US guided IVs.  Toradol and ativan orders changed from IV to IM pending access.  2:58 AM Patient ambulatory to bathroom. Carrying full emesis bag in tow. Reminder sent to RN to give ordered IM medications.  4:21 AM IV established. IVF infusing. Patient requesting narcotics for pain medication. She has been told that she will not be receiving narcotics for pain.  5:14 AM Repeat CBG is worsening despite IVF. Will start on insulin gtt given ketones on urinalysis. Urine culture added to assess for infection.  Hx of similar UAs in the past.  Patient denies dysuria, hematuria.  States she is not on her period.  Will hold abx pending culture results.  Orders placed for venous blood gas and repeat chem8 to assess for developing DKA.  6:15 AM RN at bedside obtaining repeat labs.  6:30 AM Anion gap is 13 based on chem-8, but bicarb has gone from 21 down to 15. Pending VBG to assess for acidosis; however patient persistently tachycardic to 130's despite IVF and nausea subsiding. Do not feel she will clinically improve enough for discharge from the ED. Would benefit from observation. Consult placed to [facility] for admission.  6:33 AM Venous pH on blood gas 7.1 confirming need for admission for further DKA management.  6:39 AM Dr. Maudie Mercury aware of patient. Patient to be assessed by AM hospitalist team.   Final Clinical Impression(s) / ED Diagnoses Final diagnoses:  Diabetic gastroparesis ([place])  Type 1 diabetes mellitus with ketoacidosis without coma [facility])    Rx / DC Orders ED Discharge Orders    None       Antonietta Breach, PA-C 35/32/99 [id number]    Delora Fuel, MD 83/41/96 [phone]

## 2019-01-15 NOTE — ED Notes (Signed)
We had placed her in a hospital bed with the skin treatment setting ON at about 1000 hours today. She has been much more comfortable since having received the last round of antiemetics. She remains in no distress.

## 2019-01-15 NOTE — ED Notes (Signed)
Pt ambulating with no difficulty in room, but when pt gets back in the bed, pt starts rolling around and flailing her limbs.

## 2019-01-15 NOTE — ED Notes (Signed)
Pt ambulatory from triage to room 14. Pt able to change into a gown on her own.

## 2019-01-15 NOTE — ED Notes (Signed)
Pt unable to sit still while Probation officer obtained vital signs. Pt states she is in too much pain to sit still.

## 2019-01-15 NOTE — ED Notes (Signed)
Dr. Broadus John instructs me to keep insulin drip stopped; and to cease the Endo tool also.

## 2019-01-15 NOTE — ED Notes (Signed)
Pt asked again to provide urine specimen again, but pt denies being able to void at this time.

## 2019-01-15 NOTE — ED Notes (Signed)
Pt voided on herself again after not being able to ambulate to BR to provide urine specimen. Pt encouraged to ambulate to BR to attempt and provide urine specimen. Pt able to void after ambulating to BR. Pt provided with new linens.

## 2019-01-15 NOTE — ED Notes (Signed)
Pt has been asked to provide a urine specimen multiple times, but pt states she had an accident in the bed. Pt found ambulating in the room and the bed soaked with urine. Pt given new linens and encouraged to provide urine specimen when able.

## 2019-01-15 NOTE — ED Notes (Signed)
Pt's belongings moved to ICU with pt. Pt had belongings bag with her clothes in it and crocs at bedside. Pt also had her phone but denies having a phone charger with her.

## 2019-01-15 NOTE — ED Notes (Signed)
IV team attempts unsuccessful

## 2019-01-15 NOTE — ED Notes (Signed)
Orders rec'd. From Dr. Broadus John.

## 2019-01-15 NOTE — ED Notes (Signed)
IV team at bedside 

## 2019-01-15 NOTE — ED Notes (Signed)
Awaiting IV placement for med administration

## 2019-01-15 NOTE — ED Notes (Signed)
Pharmacy tech attempted to call room phone to speak with pt- pt awake but states she does not feel like speaking to anyone at this time. Pt has phone within reach.

## 2019-01-16 DIAGNOSIS — T85614A Breakdown (mechanical) of insulin pump, initial encounter: Secondary | ICD-10-CM | POA: Diagnosis present

## 2019-01-16 DIAGNOSIS — M19041 Primary osteoarthritis, right hand: Secondary | ICD-10-CM | POA: Diagnosis present

## 2019-01-16 DIAGNOSIS — I10 Essential (primary) hypertension: Secondary | ICD-10-CM | POA: Diagnosis present

## 2019-01-16 DIAGNOSIS — Z91010 Allergy to peanuts: Secondary | ICD-10-CM | POA: Diagnosis not present

## 2019-01-16 DIAGNOSIS — E1043 Type 1 diabetes mellitus with diabetic autonomic (poly)neuropathy: Secondary | ICD-10-CM | POA: Diagnosis present

## 2019-01-16 DIAGNOSIS — M170 Bilateral primary osteoarthritis of knee: Secondary | ICD-10-CM | POA: Diagnosis present

## 2019-01-16 DIAGNOSIS — M19072 Primary osteoarthritis, left ankle and foot: Secondary | ICD-10-CM | POA: Diagnosis present

## 2019-01-16 DIAGNOSIS — E1010 Type 1 diabetes mellitus with ketoacidosis without coma: Secondary | ICD-10-CM | POA: Diagnosis not present

## 2019-01-16 DIAGNOSIS — M19071 Primary osteoarthritis, right ankle and foot: Secondary | ICD-10-CM | POA: Diagnosis present

## 2019-01-16 DIAGNOSIS — Z833 Family history of diabetes mellitus: Secondary | ICD-10-CM | POA: Diagnosis not present

## 2019-01-16 DIAGNOSIS — F419 Anxiety disorder, unspecified: Secondary | ICD-10-CM | POA: Diagnosis present

## 2019-01-16 DIAGNOSIS — R112 Nausea with vomiting, unspecified: Secondary | ICD-10-CM | POA: Diagnosis not present

## 2019-01-16 DIAGNOSIS — K219 Gastro-esophageal reflux disease without esophagitis: Secondary | ICD-10-CM | POA: Diagnosis present

## 2019-01-16 DIAGNOSIS — Z20822 Contact with and (suspected) exposure to covid-19: Secondary | ICD-10-CM | POA: Diagnosis present

## 2019-01-16 DIAGNOSIS — K3184 Gastroparesis: Secondary | ICD-10-CM | POA: Diagnosis not present

## 2019-01-16 DIAGNOSIS — E1143 Type 2 diabetes mellitus with diabetic autonomic (poly)neuropathy: Secondary | ICD-10-CM | POA: Diagnosis not present

## 2019-01-16 DIAGNOSIS — M19042 Primary osteoarthritis, left hand: Secondary | ICD-10-CM | POA: Diagnosis present

## 2019-01-16 DIAGNOSIS — Z8249 Family history of ischemic heart disease and other diseases of the circulatory system: Secondary | ICD-10-CM | POA: Diagnosis not present

## 2019-01-16 DIAGNOSIS — E876 Hypokalemia: Secondary | ICD-10-CM | POA: Diagnosis present

## 2019-01-16 DIAGNOSIS — J45909 Unspecified asthma, uncomplicated: Secondary | ICD-10-CM | POA: Diagnosis present

## 2019-01-16 DIAGNOSIS — Z794 Long term (current) use of insulin: Secondary | ICD-10-CM | POA: Diagnosis not present

## 2019-01-16 DIAGNOSIS — Z885 Allergy status to narcotic agent status: Secondary | ICD-10-CM | POA: Diagnosis not present

## 2019-01-16 DIAGNOSIS — R319 Hematuria, unspecified: Secondary | ICD-10-CM | POA: Diagnosis present

## 2019-01-16 DIAGNOSIS — Z8701 Personal history of pneumonia (recurrent): Secondary | ICD-10-CM | POA: Diagnosis not present

## 2019-01-16 DIAGNOSIS — Z79899 Other long term (current) drug therapy: Secondary | ICD-10-CM | POA: Diagnosis not present

## 2019-01-16 LAB — CBC
HCT: 34 % — ABNORMAL LOW (ref 36.0–46.0)
Hemoglobin: 9.9 g/dL — ABNORMAL LOW (ref 12.0–15.0)
MCH: 26 pg (ref 26.0–34.0)
MCHC: 29.1 g/dL — ABNORMAL LOW (ref 30.0–36.0)
MCV: 89.2 fL (ref 80.0–100.0)
Platelets: 408 10*3/uL — ABNORMAL HIGH (ref 150–400)
RBC: 3.81 MIL/uL — ABNORMAL LOW (ref 3.87–5.11)
RDW: 15.4 % (ref 11.5–15.5)
WBC: 5.9 10*3/uL (ref 4.0–10.5)
nRBC: 0 % (ref 0.0–0.2)

## 2019-01-16 LAB — GLUCOSE, CAPILLARY
Glucose-Capillary: 147 mg/dL — ABNORMAL HIGH (ref 70–99)
Glucose-Capillary: 181 mg/dL — ABNORMAL HIGH (ref 70–99)
Glucose-Capillary: 99 mg/dL (ref 70–99)
Glucose-Capillary: 156 mg/dL — ABNORMAL HIGH (ref 70–99)

## 2019-01-16 LAB — BASIC METABOLIC PANEL
Anion gap: 11 (ref 5–15)
BUN: 25 mg/dL — ABNORMAL HIGH (ref 6–20)
CO2: 18 mmol/L — ABNORMAL LOW (ref 22–32)
Calcium: 8.6 mg/dL — ABNORMAL LOW (ref 8.9–10.3)
Chloride: 108 mmol/L (ref 98–111)
Creatinine, Ser: 0.8 mg/dL (ref 0.44–1.00)
GFR calc Af Amer: >60 mL/min (ref >60)
GFR calc non Af Amer: >60 mL/min (ref >60)
Glucose, Bld: 190 mg/dL — ABNORMAL HIGH (ref 70–99)
Potassium: 3.9 mmol/L (ref 3.5–5.1)
Sodium: 137 mmol/L (ref 135–145)

## 2019-01-16 LAB — URINE CULTURE

## 2019-01-16 MED ORDER — KETOROLAC TROMETHAMINE 30 MG/ML IJ SOLN
15.0000 mg | Freq: Four times a day (QID) | INTRAMUSCULAR | Status: AC | PRN
  Administered 2019-01-16 – 2019-01-17 (×3): 15 mg via INTRAVENOUS
  Filled 2019-01-16 (×3): qty 1

## 2019-01-16 MED ORDER — INSULIN GLARGINE 100 UNIT/ML ~~LOC~~ SOLN
25.0000 [IU] | Freq: Every day | SUBCUTANEOUS | Status: DC
  Administered 2019-01-16 – 2019-01-18 (×3): 25 [IU] via SUBCUTANEOUS
  Filled 2019-01-16 (×3): qty 0.25

## 2019-01-16 MED ORDER — KETOROLAC TROMETHAMINE 30 MG/ML IJ SOLN: 15.0000 mg | Freq: Four times a day (QID) | INTRAMUSCULAR | Status: DC | PRN

## 2019-01-16 MED ORDER — MORPHINE SULFATE (PF) 2 MG/ML IV SOLN
0.5000 mg | INTRAVENOUS | Status: DC | PRN
  Administered 2019-01-16 – 2019-01-17 (×3): 0.5 mg via INTRAVENOUS
  Filled 2019-01-16 (×3): qty 1

## 2019-01-16 NOTE — Progress Notes (Signed)
PROGRESS NOTE    [name]  [id number] DOB: [birth year] DOA: 01/14/2019 PCP: Vicenta Aly, FNP  Brief Narrative: [name] is a 28 y.o. female with medical history significant of type 1 diabetes mellitus, long history of diabetic gastroparesis, asthma, anxiety, frequent hospitalizations for nausea vomiting/gastroparesis and sometimes DKA presented to the ED with worsening nausea and vomiting starting on Friday 12/25. -Recently hospitalized few weeks ago for same, CT abdomen pelvis at the time was concerning for possible pyelonephritis however urine culture and clinical symptoms did not correlate with same. -In the ED she was noted to have hyperglycemia with mild DKA, and hematuria on urinalysis  Assessment & Plan:   Recurrent nausea and vomiting - secondary to severe diabetic gastroparesis and mild DKA -Long history of same with frequent hospitalizations, last gastric emptying study in 2018 noted severe gastroparesis, abdominal exam is benign, CT few weeks ago was relatively unremarkable -DKA has corrected however continues to have severe nausea, had a few ice chips only last night, continue clear liquids today, advance diet as tolerated -Continue scheduled Reglan, IV Phenergan as needed -Continue as needed Toradol and antispasmodics/dicyclomine, educated patient again regarding importance of avoiding narcotics with severe gastroparesis, overnight got high-dose IV morphine, will add low dose today  -Continue IV fluids until p.o. intake improves  Diabetic ketoacidosis -Corrected with IV fluids and insulin drip per Endo tool protocol -Of insulin drip at this time -Resume Lantus, continue 25 units daily for now due to poor p.o. intake, may need meal coverage once oral intake and nausea has improved -Continue sliding scale insulin  Type 1 diabetes mellitus -Hemoglobin A1c was 7.6 in November -As above  Hematuria -Etiology unclear, no symptoms of UTI at this time,  urine culture few weeks ago grew 20,000 colonies of strep, no indication for antibiotics at this time, if she develops symptoms start antibiotics and follow urine cultures  Essential hypertension -Hold lisinopril, BP is soft at this time  COVID-19 PCR screening is pending at this time  DVT prophylaxis: Lovenox Code Status: Full code Family Communication: No family at bedside Disposition Plan: home pending clinical improvement   Procedures:   Antimicrobials:    Subjective: -Complains of abdominal pain, and nausea, unable to tolerate much p.o., had a few ice chips last night, requests IV morphine, overnight night coverage card called and she got 2 doses of 4 mg of IV morphine -No vomiting this morning so far  Objective: Vitals:   01/16/19 0700 01/16/19 0730 01/16/19 0800 01/16/19 0957  BP: (!) 96/42  (!) 148/91 115/62  Pulse: (!) 109  (!) 121 (!) 111  Resp: 17  15 16   Temp:  97.7 F (36.5 C)    TempSrc:  Oral    SpO2: 99%  100% 100%  Weight:      Height:        Intake/Output Summary (Last 24 hours) at 01/16/2019 1154 Last data filed at 01/16/2019 0800 Gross per 24 hour  Intake 1113.61 ml  Output 300 ml  Net 813.61 ml   Filed Weights   01/14/19 2308  Weight: 52.9 kg    Examination:  Gen: Thinly built African-American female, sitting up in bed, AAOx3, slightly uncomfortable appearing HEENT: PERRLA, Neck supple, no JVD Lungs: Clear  CVS: S1-S2, regular rhythm, mildly tachycardic  abd: Soft, minimal diffuse tenderness, no rigidity or rebound, bowel sounds present  extremities: No edema Skin: no new rashes Psychiatry: Flat affect    Data Reviewed:   CBC: Recent Labs  Lab 01/14/19 1952  01/15/19 0626 01/16/19 0254  WBC 8.3  --  5.9  HGB 12.7 11.2* 9.9*  HCT 42.8 33.0* 34.0*  MCV 88.6  --  89.2  PLT 489*  --  123XX123*   Basic Metabolic Panel: Recent Labs  Lab 01/14/19 1952 01/15/19 0626 01/15/19 1401 01/15/19 1656 01/16/19 0254  NA 134* 139 138  140 137  K 3.7 4.4 4.1 3.9 3.9  CL 97* 111 109 112* 108  CO2 21*  --  18* 21* 18*  GLUCOSE 156* 441* 168* 102* 190*  BUN 22* 25* 29* 29* 25*  CREATININE 0.74 0.70 0.82 0.80 0.80  CALCIUM 9.6  --  8.6* 8.5* 8.6*   GFR: Estimated Creatinine Clearance: 85.8 mL/min (by C-G formula based on SCr of 0.8 mg/dL). Liver Function Tests: Recent Labs  Lab 01/14/19 1952  AST 18  ALT 12  ALKPHOS 109  BILITOT 0.6  PROT 8.9*  ALBUMIN 4.4   Recent Labs  Lab 01/14/19 1952  LIPASE 16   No results for input(s): AMMONIA in the last 168 hours. Coagulation Profile: No results for input(s): INR, PROTIME in the last 168 hours. Cardiac Enzymes: No results for input(s): CKTOTAL, CKMB, CKMBINDEX, TROPONINI in the last 168 hours. BNP (last 3 results) No results for input(s): PROBNP in the last 8760 hours. HbA1C: No results for input(s): HGBA1C in the last 72 hours. CBG: Recent Labs  Lab 01/15/19 1806 01/15/19 1938 01/15/19 2309 01/16/19 0726 01/16/19 1134  GLUCAP 124* 92 139* 147* 181*   Lipid Profile: No results for input(s): CHOL, HDL, LDLCALC, TRIG, CHOLHDL, LDLDIRECT in the last 72 hours. Thyroid Function Tests: No results for input(s): TSH, T4TOTAL, FREET4, T3FREE, THYROIDAB in the last 72 hours. Anemia Panel: No results for input(s): VITAMINB12, FOLATE, FERRITIN, TIBC, IRON, RETICCTPCT in the last 72 hours. Urine analysis:    Component Value Date/Time   COLORURINE YELLOW 01/15/2019 0042   APPEARANCEUR CLOUDY (A) 01/15/2019 0042   LABSPEC 1.023 01/15/2019 0042   PHURINE 6.0 01/15/2019 0042   GLUCOSEU >=500 (A) 01/15/2019 0042   GLUCOSEU >=1000 11/07/2012 1205   HGBUR MODERATE (A) 01/15/2019 0042   BILIRUBINUR NEGATIVE 01/15/2019 0042   KETONESUR 80 (A) 01/15/2019 0042   PROTEINUR 100 (A) 01/15/2019 0042   UROBILINOGEN 0.2 11/24/2014 1045   NITRITE NEGATIVE 01/15/2019 0042   LEUKOCYTESUR LARGE (A) 01/15/2019 0042   Sepsis  Labs: @LABRCNTIP (procalcitonin:4,lacticidven:4)  ) Recent Results (from the past 240 hour(s))  Urine culture     Status: Abnormal   Collection Time: 01/15/19 12:42 AM   Specimen: Urine, Clean Catch  Result Value Ref Range Status   Specimen Description   Final    URINE, CLEAN CATCH Performed at [facility], [place] [address], [place], [place] [address]    Special Requests   Final    NONE Performed at [facility], [place] [address], [place],  [address]    Culture MULTIPLE SPECIES PRESENT, SUGGEST RECOLLECTION (A)  Final   Report Status 01/16/2019 FINAL  Final  SARS CORONAVIRUS 2 (TAT 6-24 HRS) Nasopharyngeal Nasopharyngeal Swab     Status: None   Collection Time: 01/15/19  7:49 AM   Specimen: Nasopharyngeal Swab  Result Value Ref Range Status   SARS Coronavirus 2 NEGATIVE NEGATIVE Final    Comment: (NOTE) SARS-CoV-2 target nucleic acids are NOT DETECTED. The SARS-CoV-2 RNA is generally detectable in upper and lower respiratory specimens during the acute phase of infection. Negative results do not preclude SARS-CoV-2 infection, do not rule out  co-infections with other pathogens, and should not be used as the sole basis for treatment or other patient management decisions. Negative results must be combined with clinical observations, patient history, and epidemiological information. The expected result is Negative. Fact Sheet for Patients: SugarRoll.be Fact Sheet for Healthcare Providers: https://www.woods-mathews.com/ This test is not yet approved or cleared by the [place] FDA and  has been authorized for detection and/or diagnosis of SARS-CoV-2 by FDA under an Emergency Use Authorization (EUA). This EUA will remain  in effect (meaning this test can be used) for the duration of the COVID-19 declaration under Section 56 4(b)(1) of the Act, 21 U.S.C. section 360bbb-3(b)(1), unless the  authorization is terminated or revoked sooner. Performed at [facility], [place] [address], [place], [place] [address]          Radiology Studies: No results found.      Scheduled Meds: . Chlorhexidine Gluconate Cloth  6 each Topical Daily  . enoxaparin (LOVENOX) injection  40 mg Subcutaneous Q24H  . insulin aspart  0-15 Units Subcutaneous TID WC  . insulin aspart  0-5 Units Subcutaneous QHS  . insulin glargine  25 Units Subcutaneous QHS  . metoCLOPramide (REGLAN) injection  10 mg Intravenous TID AC  . pantoprazole (PROTONIX) IV  40 mg Intravenous Q24H   Continuous Infusions: . sodium chloride 75 mL/hr at 01/16/19 0800     LOS: 0 days    Time spent: 21min  Domenic Polite, MD [facility]  01/16/2019, 11:54 AM

## 2019-01-16 NOTE — Progress Notes (Signed)
Inpatient Diabetes Program Recommendations  AACE/ADA: New Consensus Statement on Inpatient Glycemic Control (2015)  Target Ranges:  Prepandial:   less than 140 mg/dL      Peak postprandial:   less than 180 mg/dL (1-2 hours)      Critically ill patients:  140 - 180 mg/dL   Lab Results  Component Value Date   GLUCAP 181 (H) 01/16/2019   HGBA1C 7.6 (H) 11/21/2018    Review of Glycemic Control  Diabetes history: DM1 Outpatient Diabetes medications: Tresiba 16 units QD, Novolog s/s Current orders for Inpatient glycemic control: Lantus 25 units QHS, Novolog 0-15 units tidwc and 0-5 units QHS  HgbA1C - 7.6%   Inpatient Diabetes Program Recommendations:     Decrease Novolog to 0-9 units tidwc and 0-5 units QHS When diet advanced to [place] med, add Novolog 4 units tidwc for meal coverage insulin if pt eats > 50% meal.  Do not restart insulin pump for 24H after last dose of Lantus.  Follow.  Thank you. Lorenda Peck, RD, LDN, CDE Inpatient Diabetes Coordinator [phone]

## 2019-01-17 DIAGNOSIS — K3184 Gastroparesis: Secondary | ICD-10-CM | POA: Diagnosis not present

## 2019-01-17 DIAGNOSIS — E1143 Type 2 diabetes mellitus with diabetic autonomic (poly)neuropathy: Secondary | ICD-10-CM | POA: Diagnosis not present

## 2019-01-17 DIAGNOSIS — E1010 Type 1 diabetes mellitus with ketoacidosis without coma: Secondary | ICD-10-CM | POA: Diagnosis not present

## 2019-01-17 LAB — BASIC METABOLIC PANEL
Anion gap: 10 (ref 5–15)
BUN: 17 mg/dL (ref 6–20)
CO2: 18 mmol/L — ABNORMAL LOW (ref 22–32)
Calcium: 9.1 mg/dL (ref 8.9–10.3)
Chloride: 110 mmol/L (ref 98–111)
Creatinine, Ser: 0.81 mg/dL (ref 0.44–1.00)
GFR calc Af Amer: >60 mL/min (ref >60)
GFR calc non Af Amer: >60 mL/min (ref >60)
Glucose, Bld: 160 mg/dL — ABNORMAL HIGH (ref 70–99)
Potassium: 5 mmol/L (ref 3.5–5.1)
Sodium: 138 mmol/L (ref 135–145)

## 2019-01-17 LAB — GLUCOSE, CAPILLARY
Glucose-Capillary: 158 mg/dL — ABNORMAL HIGH (ref 70–99)
Glucose-Capillary: 205 mg/dL — ABNORMAL HIGH (ref 70–99)
Glucose-Capillary: 66 mg/dL — ABNORMAL LOW (ref 70–99)
Glucose-Capillary: 95 mg/dL (ref 70–99)
Glucose-Capillary: 204 mg/dL — ABNORMAL HIGH (ref 70–99)

## 2019-01-17 MED ORDER — MORPHINE SULFATE (PF) 2 MG/ML IV SOLN
1.0000 mg | INTRAVENOUS | Status: DC | PRN
  Administered 2019-01-17 – 2019-01-18 (×4): 1 mg via INTRAVENOUS
  Filled 2019-01-17 (×5): qty 1

## 2019-01-17 NOTE — Progress Notes (Signed)
PROGRESS NOTE    [name]  [id number] DOB: Aug 17, 1990 DOA: 01/14/2019 PCP: Vicenta Aly, FNP  Brief Narrative: [name] is a 28 y.o. female with medical history significant of type 1 diabetes mellitus, long history of diabetic gastroparesis, asthma, anxiety, frequent hospitalizations for nausea vomiting/gastroparesis and sometimes DKA presented to the ED with worsening nausea and vomiting starting on Friday 12/25. -Recently hospitalized few weeks ago for same, CT abdomen pelvis at the time was concerning for possible pyelonephritis however urine culture and clinical symptoms did not correlate with same. -In the ED she was noted to have hyperglycemia with mild DKA, and hematuria on urinalysis  Assessment & Plan:   Recurrent nausea and vomiting - secondary to severe diabetic gastroparesis and mild DKA -Long history of same with frequent hospitalizations, last gastric emptying study in 2018 noted severe gastroparesis, abdominal exam is benign, CT few weeks ago was relatively unremarkable -DKA has corrected however continues to have severe nausea,, episode of vomiting overnight and early this morning, also complains of severe abdominal pain and cramps -Continue scheduled Reglan, IV Phenergan as needed -Dicyclomine/antispasmodics, only low-dose morphine as needed -Continue IV fluids today, clear liquid diet, advance as tolerated  Diabetic ketoacidosis -Corrected with IV fluids and insulin drip per Endo tool protocol -Of insulin drip at this time -Continue Lantus 25 units daily, once p.o. intake improves will add meal coverage -Continue sliding scale insulin  Type 1 diabetes mellitus -Hemoglobin A1c was 7.6 in November -As above  Hematuria -Etiology unclear, no symptoms of UTI at this time, urine culture few weeks ago grew 20,000 colonies of strep, no indication for antibiotics at this time, if she develops symptoms start antibiotics and follow urine  cultures  Essential hypertension -Hold lisinopril  COVID-19 PCR negative  DVT prophylaxis: Lovenox Code Status: Full code Family Communication: No family at bedside Disposition Plan: home pending clinical improvement   Procedures:   Antimicrobials:    Subjective: -Continues to complain of abdominal pain,, rolling over in bed and pain, begging for IV pain meds, long discussion how narcotics can be counterproductive  Objective: Vitals:   01/16/19 0957 01/16/19 1410 01/16/19 2024 01/17/19 0440  BP: 115/62 114/77 (!) 139/99 (!) 152/97  Pulse: (!) 111 (!) 106 (!) 110 (!) 108  Resp: 16 16 16 15   Temp:  97.9 F (36.6 C) 100 F (37.8 C) 99.6 F (37.6 C)  TempSrc:  Oral Oral Oral  SpO2: 100% 99% 100% 100%  Weight:      Height:        Intake/Output Summary (Last 24 hours) at 01/17/2019 1134 Last data filed at 01/17/2019 [id number] Gross per 24 hour  Intake 1263 ml  Output 0 ml  Net 1263 ml   Filed Weights   01/14/19 2308  Weight: 52.9 kg    Examination:  Gen: thinly built, chronically ill-appearing African-American female, bent over in bed, uncomfortable appearing HEENT: PERRLA, Neck supple, no JVD Lungs: Clear  CVS: S1-S2, rhythm, mildly tachycardic Abd: Soft, nondistended, mild epigastric tenderness, bowel sounds present  extremities: No Cyanosis, Clubbing or edema Skin: no new rashes Psychiatry: Flat affect    Data Reviewed:   CBC: Recent Labs  Lab 01/14/19 1952 01/15/19 0626 01/16/19 0254  WBC 8.3  --  5.9  HGB 12.7 11.2* 9.9*  HCT 42.8 33.0* 34.0*  MCV 88.6  --  89.2  PLT 489*  --  123XX123*   Basic Metabolic Panel: Recent Labs  Lab 01/14/19 1952 01/15/19 0626 01/15/19 1401 01/15/19 1656 01/16/19 0254 01/17/19  0447  NA 134* 139 138 140 137 138  K 3.7 4.4 4.1 3.9 3.9 5.0  CL 97* 111 109 112* 108 110  CO2 21*  --  18* 21* 18* 18*  GLUCOSE 156* 441* 168* 102* 190* 160*  BUN 22* 25* 29* 29* 25* 17  CREATININE 0.74 0.70 0.82 0.80 0.80 0.81   CALCIUM 9.6  --  8.6* 8.5* 8.6* 9.1   GFR: Estimated Creatinine Clearance: 84.8 mL/min (by C-G formula based on SCr of 0.81 mg/dL). Liver Function Tests: Recent Labs  Lab 01/14/19 1952  AST 18  ALT 12  ALKPHOS 109  BILITOT 0.6  PROT 8.9*  ALBUMIN 4.4   Recent Labs  Lab 01/14/19 1952  LIPASE 16   No results for input(s): AMMONIA in the last 168 hours. Coagulation Profile: No results for input(s): INR, PROTIME in the last 168 hours. Cardiac Enzymes: No results for input(s): CKTOTAL, CKMB, CKMBINDEX, TROPONINI in the last 168 hours. BNP (last 3 results) No results for input(s): PROBNP in the last 8760 hours. HbA1C: No results for input(s): HGBA1C in the last 72 hours. CBG: Recent Labs  Lab 01/16/19 0726 01/16/19 1134 01/16/19 1647 01/16/19 2125 01/17/19 0748  GLUCAP 147* 181* 99 156* 158*   Lipid Profile: No results for input(s): CHOL, HDL, LDLCALC, TRIG, CHOLHDL, LDLDIRECT in the last 72 hours. Thyroid Function Tests: No results for input(s): TSH, T4TOTAL, FREET4, T3FREE, THYROIDAB in the last 72 hours. Anemia Panel: No results for input(s): VITAMINB12, FOLATE, FERRITIN, TIBC, IRON, RETICCTPCT in the last 72 hours. Urine analysis:    Component Value Date/Time   COLORURINE YELLOW 01/15/2019 0042   APPEARANCEUR CLOUDY (A) 01/15/2019 0042   LABSPEC 1.023 01/15/2019 0042   PHURINE 6.0 01/15/2019 0042   GLUCOSEU >=500 (A) 01/15/2019 0042   GLUCOSEU >=1000 11/07/2012 1205   HGBUR MODERATE (A) 01/15/2019 0042   BILIRUBINUR NEGATIVE 01/15/2019 0042   KETONESUR 80 (A) 01/15/2019 0042   PROTEINUR 100 (A) 01/15/2019 0042   UROBILINOGEN 0.2 11/24/2014 1045   NITRITE NEGATIVE 01/15/2019 0042   LEUKOCYTESUR LARGE (A) 01/15/2019 0042   Sepsis Labs: @[email or web address] (procalcitonin:4,lacticidven:4)  ) Recent Results (from the past 240 hour(s))  Urine culture     Status: Abnormal   Collection Time: 01/15/19 12:42 AM   Specimen: Urine, Clean Catch  Result Value Ref Range  Status   Specimen Description   Final    URINE, CLEAN CATCH Performed at [facility], [place] [address], [place], [place] [address]    Special Requests   Final    NONE Performed at [facility], [place] [address], [place], [place] [address]    Culture MULTIPLE SPECIES PRESENT, SUGGEST RECOLLECTION (A)  Final   Report Status 01/16/2019 FINAL  Final  SARS CORONAVIRUS 2 (TAT 6-24 HRS) Nasopharyngeal Nasopharyngeal Swab     Status: None   Collection Time: 01/15/19  7:49 AM   Specimen: Nasopharyngeal Swab  Result Value Ref Range Status   SARS Coronavirus 2 NEGATIVE NEGATIVE Final    Comment: (NOTE) SARS-CoV-2 target nucleic acids are NOT DETECTED. The SARS-CoV-2 RNA is generally detectable in upper and lower respiratory specimens during the acute phase of infection. Negative results do not preclude SARS-CoV-2 infection, do not rule out co-infections with other pathogens, and should not be used as the sole basis for treatment or other patient management decisions. Negative results must be combined with clinical observations, patient history, and epidemiological information. The expected result is Negative. Fact Sheet for Patients: SugarRoll.be Fact Sheet  for Healthcare Providers: https://www.woods-mathews.com/ This test is not yet approved or cleared by the [place] and  has been authorized for detection and/or diagnosis of SARS-CoV-2 by FDA under an Emergency Use Authorization (EUA). This EUA will remain  in effect (meaning this test can be used) for the duration of the COVID-19 declaration under Section 56 4(b)(1) of the Act, 21 U.S.C. section 360bbb-3(b)(1), unless the authorization is terminated or revoked sooner. Performed at [facility], [place] [address], [place], [place] [address]          Radiology Studies: No results found.      Scheduled Meds: . Chlorhexidine Gluconate  Cloth  6 each Topical Daily  . enoxaparin (LOVENOX) injection  40 mg Subcutaneous Q24H  . insulin aspart  0-15 Units Subcutaneous TID WC  . insulin aspart  0-5 Units Subcutaneous QHS  . insulin glargine  25 Units Subcutaneous QHS  . metoCLOPramide (REGLAN) injection  10 mg Intravenous TID AC  . pantoprazole (PROTONIX) IV  40 mg Intravenous Q24H   Continuous Infusions: . sodium chloride 75 mL/hr at 01/16/19 1958     LOS: 1 day    Time spent: 52min  Domenic Polite, MD [facility]  01/17/2019, 11:34 AM

## 2019-01-17 NOTE — Plan of Care (Signed)
  Problem: Education: Goal: Knowledge of General Education information will improve Description: Including pain rating scale, medication(s)/side effects and non-pharmacologic comfort measures Outcome: Progressing   Problem: Pain Managment: Goal: General experience of comfort will improve Outcome: Progressing   

## 2019-01-18 DIAGNOSIS — R112 Nausea with vomiting, unspecified: Secondary | ICD-10-CM | POA: Diagnosis not present

## 2019-01-18 LAB — GLUCOSE, CAPILLARY
Glucose-Capillary: 150 mg/dL — ABNORMAL HIGH (ref 70–99)
Glucose-Capillary: 141 mg/dL — ABNORMAL HIGH (ref 70–99)
Glucose-Capillary: 69 mg/dL — ABNORMAL LOW (ref 70–99)
Glucose-Capillary: 71 mg/dL (ref 70–99)
Glucose-Capillary: 138 mg/dL — ABNORMAL HIGH (ref 70–99)
Glucose-Capillary: 228 mg/dL — ABNORMAL HIGH (ref 70–99)
Glucose-Capillary: 93 mg/dL (ref 70–99)

## 2019-01-18 LAB — BASIC METABOLIC PANEL
Anion gap: 9 (ref 5–15)
BUN: 9 mg/dL (ref 6–20)
CO2: 20 mmol/L — ABNORMAL LOW (ref 22–32)
Calcium: 8.7 mg/dL — ABNORMAL LOW (ref 8.9–10.3)
Chloride: 108 mmol/L (ref 98–111)
Creatinine, Ser: 0.53 mg/dL (ref 0.44–1.00)
GFR calc Af Amer: >60 mL/min (ref >60)
GFR calc non Af Amer: >60 mL/min (ref >60)
Glucose, Bld: 141 mg/dL — ABNORMAL HIGH (ref 70–99)
Potassium: 3.7 mmol/L (ref 3.5–5.1)
Sodium: 137 mmol/L (ref 135–145)

## 2019-01-18 MED ORDER — MORPHINE SULFATE (PF) 2 MG/ML IV SOLN
2.0000 mg | INTRAVENOUS | Status: DC | PRN
  Administered 2019-01-18 – 2019-01-19 (×6): 2 mg via INTRAVENOUS
  Filled 2019-01-18 (×7): qty 1

## 2019-01-18 NOTE — Progress Notes (Addendum)
PROGRESS NOTE    [name]  [id number] DOB: 1990-08-01 DOA: 01/14/2019 PCP: Vicenta Aly, FNP   Brief Narrative: Patient is a 28 year old female with history of type 1 diabetes, GERD, asthma, diabetic  gastroparesis, hypertension, intractable cyclic vomiting syndrome, pancreatitis who presents to the emergency department with complaints of nausea, vomiting. On arrival she was found to be tachycardic, slightly hypertensive. She was admitted, managed and discharged on 12/29/2018 for the same problem.   Hospital course remarkable with persistent abdominal pain, nausea and has vomiting.  This morning she still complains of vomiting and abdominal pain.  Assessment & Plan:   Active Problems:   Nausea and vomiting    DKA/DM1: Started on insulin drip and it has been stopped now.She follows with endocrinologist. At home,she is Currently she is on Levemir 30 units daily with sliding scale. She was previously on insulin pump which is not functioning at present. Her endocrinologist is arranging a new with insulin pump for her. Hemoglobin A1c was 7.6 on 11/21/2018 Continue current insulin regimen.  Nausea/vomiting/abdominal pain: Secondary to gastroparesis. On reglan.  CT abd/pelvis on her last admission did not show any acute intra-abdominal abnormalities. CT abdomen/pelvis showed possible pyelonephritis but she did not have any dysuria, no fever or leukocytosis or  costovertebral angle tenderness so antibiotics not indicated at that time.  Urine culture had grown Candida. Continues to complain of abdominal pain, nausea and vomiting.  This is secondary to her gastroparesis.  Continue IV Reglan, IV pain medicines.  Hypertension:On lisinopril at home which is on hold  Asthma: Currently stable. No wheezing.  Hypokalemia: Continue supplementation as needed        DVT prophylaxis:Lovenox Code Status: Full Family Communication: None present at the bedside Disposition  Plan: Home when able to tolerate diet, nausea/vomiting abdomen pain resolves   Consultants: None  Procedures: None  Antimicrobials:  Anti-infectives (From admission, onward)   None      Subjective:  Patient seen and examined the bedside this morning.  Hemodynamically stable.  Continues to complain of abdominal pain, she vomited this morning.  Requested for pain medicines.  Objective: Vitals:   01/17/19 2136 01/17/19 2141 01/17/19 2306 01/18/19 0528  BP: (!) 144/95  (!) 137/97 (!) 155/85  Pulse: (!) 108  91 (!) 109  Resp: 18  18 18   Temp: (!) 100.5 F (38.1 C)  99.3 F (37.4 C) 99 F (37.2 C)  TempSrc: Oral  Oral Oral  SpO2: 100% 100% 99%   Weight:      Height:        Intake/Output Summary (Last 24 hours) at 01/18/2019 0840 Last data filed at 01/18/2019 0600 Gross per 24 hour  Intake 1861.77 ml  Output 900 ml  Net 961.77 ml   Filed Weights   01/14/19 2308  Weight: 52.9 kg    Examination:  General exam: Appears calm and comfortable ,Not in distress,average built HEENT:PERRL,Oral mucosa moist, Ear/Nose normal on gross exam Respiratory system: Bilateral equal air entry, normal vesicular breath sounds, no wheezes or crackles  Cardiovascular system: S1 & S2 heard, RRR. No JVD, murmurs, rubs, gallops or clicks. No pedal edema. Gastrointestinal system: Abdomen is nondistended, soft and is tender in epigastric region. No organomegaly or masses felt. Normal bowel sounds heard. Central nervous system: Alert and oriented. No focal neurological deficits. Extremities: No edema, no clubbing ,no cyanosis, distal peripheral pulses palpable. Skin: No rashes, lesions or ulcers,no icterus ,no pallor   Data Reviewed: I have personally reviewed following labs and imaging studies  CBC: Recent Labs  Lab 01/14/19 1952 01/15/19 0626 01/16/19 0254  WBC 8.3  --  5.9  HGB 12.7 11.2* 9.9*  HCT 42.8 33.0* 34.0*  MCV 88.6  --  89.2  PLT 489*  --  123XX123*   Basic Metabolic  Panel: Recent Labs  Lab 01/15/19 1401 01/15/19 1656 01/16/19 0254 01/17/19 0447 01/18/19 0507  NA 138 140 137 138 137  K 4.1 3.9 3.9 5.0 3.7  CL 109 112* 108 110 108  CO2 18* 21* 18* 18* 20*  GLUCOSE 168* 102* 190* 160* 141*  BUN 29* 29* 25* 17 9  CREATININE 0.82 0.80 0.80 0.81 0.53  CALCIUM 8.6* 8.5* 8.6* 9.1 8.7*   GFR: Estimated Creatinine Clearance: 85.8 mL/min (by C-G formula based on SCr of 0.53 mg/dL). Liver Function Tests: Recent Labs  Lab 01/14/19 1952  AST 18  ALT 12  ALKPHOS 109  BILITOT 0.6  PROT 8.9*  ALBUMIN 4.4   Recent Labs  Lab 01/14/19 1952  LIPASE 16   No results for input(s): AMMONIA in the last 168 hours. Coagulation Profile: No results for input(s): INR, PROTIME in the last 168 hours. Cardiac Enzymes: No results for input(s): CKTOTAL, CKMB, CKMBINDEX, TROPONINI in the last 168 hours. BNP (last 3 results) No results for input(s): PROBNP in the last 8760 hours. HbA1C: No results for input(s): HGBA1C in the last 72 hours. CBG: Recent Labs  Lab 01/17/19 1713 01/17/19 1748 01/17/19 2245 01/18/19 0223 01/18/19 0744  GLUCAP 66* 95 204* 150* 141*   Lipid Profile: No results for input(s): CHOL, HDL, LDLCALC, TRIG, CHOLHDL, LDLDIRECT in the last 72 hours. Thyroid Function Tests: No results for input(s): TSH, T4TOTAL, FREET4, T3FREE, THYROIDAB in the last 72 hours. Anemia Panel: No results for input(s): VITAMINB12, FOLATE, FERRITIN, TIBC, IRON, RETICCTPCT in the last 72 hours. Sepsis Labs: No results for input(s): PROCALCITON, LATICACIDVEN in the last 168 hours.  Recent Results (from the past 240 hour(s))  Urine culture     Status: Abnormal   Collection Time: 01/15/19 12:42 AM   Specimen: Urine, Clean Catch  Result Value Ref Range Status   Specimen Description   Final    URINE, CLEAN CATCH Performed at [facility], [place] [address] [place], [place] [address]    Special Requests   Final    NONE Performed at [facility], [place] [address], [place], [place] [address]    Culture MULTIPLE SPECIES PRESENT, SUGGEST RECOLLECTION (A)  Final   Report Status 01/16/2019 FINAL  Final  SARS CORONAVIRUS 2 (TAT 6-24 HRS) Nasopharyngeal Nasopharyngeal Swab     Status: None   Collection Time: 01/15/19  7:49 AM   Specimen: Nasopharyngeal Swab  Result Value Ref Range Status   SARS Coronavirus 2 NEGATIVE NEGATIVE Final    Comment: (NOTE) SARS-CoV-2 target nucleic acids are NOT DETECTED. The SARS-CoV-2 RNA is generally detectable in upper and lower respiratory specimens during the acute phase of infection. Negative results do not preclude SARS-CoV-2 infection, do not rule out co-infections with other pathogens, and should not be used as the sole basis for treatment or other patient management decisions. Negative results must be combined with clinical observations, patient history, and epidemiological information. The expected result is Negative. Fact Sheet for Patients: SugarRoll.be Fact Sheet for Healthcare Providers: https://www.woods-mathews.com/ This test is not yet approved or cleared by the [place] FDA and  has been authorized for detection and/or diagnosis of SARS-CoV-2 by FDA under an Emergency Use Authorization (EUA). This  EUA will remain  in effect (meaning this test can be used) for the duration of the COVID-19 declaration under Section 56 4(b)(1) of the Act, 21 U.S.C. section 360bbb-3(b)(1), unless the authorization is terminated or revoked sooner. Performed at [facility], [place] [address], [place], [place] [address]          Radiology Studies: No results found.      Scheduled Meds: . Chlorhexidine Gluconate Cloth  6 each Topical Daily  . enoxaparin (LOVENOX) injection  40 mg Subcutaneous Q24H  . insulin aspart  0-15 Units Subcutaneous TID WC  . insulin aspart  0-5 Units Subcutaneous QHS  . insulin glargine  25 Units  Subcutaneous QHS  . metoCLOPramide (REGLAN) injection  10 mg Intravenous TID AC  . pantoprazole (PROTONIX) IV  40 mg Intravenous Q24H   Continuous Infusions: . sodium chloride 75 mL/hr at 01/18/19 0424     LOS: 2 days    Time spent: 25 mins, More than 50% of that time was spent in counseling and/or coordination of care.      Shelly Coss, MD [facility] Pager [phone]  If 7PM-7AM, please contact night-coverage [email or web address] Password [email or web address] 01/18/2019, 8:40 AM

## 2019-01-18 NOTE — Progress Notes (Signed)
Hypoglycemic Event  CBG: 69  Treatment: 4 oz juice/soda  Symptoms: None  Follow-up CBG: Time:1232 CBG Result:138  Possible Reasons for Event: Inadequate meal intake  Comments/MD notified:   Will continue to monitor patient. No acute change. No symptoms.     Franki Monte, RN

## 2019-01-18 NOTE — Progress Notes (Signed)
CBG 71. Patient has poor oral intake. MD aware.   Patient on clear liquid diet.    Given 2oz (56mL) orange juice and recheck blood sugar to assure patient glucose does not continue to drop.    Will continue to monitor patient.     [place], Therapist, sports

## 2019-01-18 NOTE — Progress Notes (Signed)
Patient requesting additional pain medication or increased dosage. Paged M. Sharlet Salina. Patient had emesis episodes overnight.

## 2019-01-19 DIAGNOSIS — R112 Nausea with vomiting, unspecified: Secondary | ICD-10-CM | POA: Diagnosis not present

## 2019-01-19 LAB — BASIC METABOLIC PANEL
Anion gap: 10 (ref 5–15)
BUN: 8 mg/dL (ref 6–20)
CO2: 19 mmol/L — ABNORMAL LOW (ref 22–32)
Calcium: 8.6 mg/dL — ABNORMAL LOW (ref 8.9–10.3)
Chloride: 110 mmol/L (ref 98–111)
Creatinine, Ser: 0.66 mg/dL (ref 0.44–1.00)
GFR calc Af Amer: >60 mL/min (ref >60)
GFR calc non Af Amer: >60 mL/min (ref >60)
Glucose, Bld: 103 mg/dL — ABNORMAL HIGH (ref 70–99)
Potassium: 5.1 mmol/L (ref 3.5–5.1)
Sodium: 139 mmol/L (ref 135–145)

## 2019-01-19 LAB — GLUCOSE, CAPILLARY
Glucose-Capillary: 106 mg/dL — ABNORMAL HIGH (ref 70–99)
Glucose-Capillary: 47 mg/dL — ABNORMAL LOW (ref 70–99)
Glucose-Capillary: 137 mg/dL — ABNORMAL HIGH (ref 70–99)
Glucose-Capillary: 82 mg/dL (ref 70–99)
Glucose-Capillary: 253 mg/dL — ABNORMAL HIGH (ref 70–99)

## 2019-01-19 MED ORDER — DEXTROSE 50 % IV SOLN
INTRAVENOUS | Status: AC
  Administered 2019-01-19: 50 mL
  Filled 2019-01-19: qty 50

## 2019-01-19 MED ORDER — INSULIN ASPART 100 UNIT/ML ~~LOC~~ SOLN
4.0000 [IU] | Freq: Three times a day (TID) | SUBCUTANEOUS | Status: DC
  Administered 2019-01-19 – 2019-01-20 (×2): 4 [IU] via SUBCUTANEOUS

## 2019-01-19 MED ORDER — INSULIN ASPART 100 UNIT/ML ~~LOC~~ SOLN
0.0000 [IU] | Freq: Three times a day (TID) | SUBCUTANEOUS | Status: DC
  Administered 2019-01-19: 2 [IU] via SUBCUTANEOUS
  Administered 2019-01-20: 5 [IU] via SUBCUTANEOUS

## 2019-01-19 NOTE — Plan of Care (Signed)
Continue current POC 

## 2019-01-19 NOTE — Progress Notes (Signed)
PROGRESS NOTE    [name]  [id number] DOB: Mar 13, 1990 DOA: 01/14/2019 PCP: Vicenta Aly, FNP   Brief Narrative: Patient is a 28 year old female with history of type 1 diabetes, GERD, asthma, diabetic  gastroparesis, hypertension, intractable cyclic vomiting syndrome, pancreatitis who presents to the emergency department with complaints of nausea, vomiting. On arrival she was found to be tachycardic, slightly hypertensive. She was admitted, managed and discharged on 12/29/2018 for the same problem.  Hospital course remarkable with persistent abdominal pain, nausea and has vomiting.  This morning she feels better.  Diet advanced to carbohydrate consistent.  Plan for discharge tomorrow.  Assessment & Plan:   Active Problems:   Nausea and vomiting    DKA/DM1: Started on insulin drip and it has been stopped now.She follows with endocrinologist. At home,she is Currently she is on Levemir 30 units daily with sliding scale. She was previously on insulin pump which is not functioning at present. Her endocrinologist is arranging a new with insulin pump for her. Hemoglobin A1c was 7.6 on 11/21/2018 Continue current insulin regimen.  Nausea/vomiting/abdominal pain: Secondary to gastroparesis. On reglan.  CT abd/pelvis on her last admission did not show any acute intra-abdominal abnormalities. CT abdomen/pelvis showed possible pyelonephritis but she did not have any dysuria, no fever or leukocytosis or  costovertebral angle tenderness so antibiotics not indicated at that time.  Urine culture had grown Candida. Continues to complain of abdominal pain, nausea and vomiting.  This is secondary to her gastroparesis.  Continue IV Reglan, IV pain medicines.  Hypertension:On lisinopril at home which is on hold  Asthma: Currently stable. No wheezing.  Hypokalemia: Continue supplementation as needed        DVT prophylaxis:Lovenox Code Status: Full Family Communication: None  present at the bedside Disposition Plan: Home  tomorrow  Consultants: None  Procedures: None  Antimicrobials:  Anti-infectives (From admission, onward)   None      Subjective:  Patient seen and examined the bedside this morning.  Hemodynamically stable.  She feels much better today, no vomiting this morning.  Wants to advance diet.  Objective: Vitals:   01/18/19 0528 01/18/19 1422 01/18/19 2205 01/19/19 0510  BP: (!) 155/85 (!) 135/97 126/90 124/89  Pulse: (!) 109 (!) 103 (!) 102 95  Resp: 18 15 19 16   Temp: 99 F (37.2 C) 98.4 F (36.9 C) 98.7 F (37.1 C) 98.3 F (36.8 C)  TempSrc: Oral Oral Oral Oral  SpO2:  100% 100% 100%  Weight:      Height:        Intake/Output Summary (Last 24 hours) at 01/19/2019 1335 Last data filed at 01/19/2019 1000 Gross per 24 hour  Intake 2369.75 ml  Output 0 ml  Net 2369.75 ml   Filed Weights   01/14/19 2308  Weight: 52.9 kg    Examination:  General exam: Appears calm and comfortable ,Not in distress,average built HEENT:PERRL,Oral mucosa moist, Ear/Nose normal on gross exam Respiratory system: Bilateral equal air entry, normal vesicular breath sounds, no wheezes or crackles  Cardiovascular system: S1 & S2 heard, RRR. No JVD, murmurs, rubs, gallops or clicks. No pedal edema. Gastrointestinal system: Abdomen is nondistended, soft and is tender in epigastric region. No organomegaly or masses felt. Normal bowel sounds heard. Central nervous system: Alert and oriented. No focal neurological deficits. Extremities: No edema, no clubbing ,no cyanosis, distal peripheral pulses palpable. Skin: No rashes, lesions or ulcers,no icterus ,no pallor   Data Reviewed: I have personally reviewed following labs and imaging studies  CBC:  Recent Labs  Lab 01/14/19 1952 01/15/19 0626 01/16/19 0254  WBC 8.3  --  5.9  HGB 12.7 11.2* 9.9*  HCT 42.8 33.0* 34.0*  MCV 88.6  --  89.2  PLT 489*  --  123XX123*   Basic Metabolic Panel: Recent Labs    Lab 01/15/19 1656 01/16/19 0254 01/17/19 0447 01/18/19 0507 01/19/19 0509  NA 140 137 138 137 139  K 3.9 3.9 5.0 3.7 5.1  CL 112* 108 110 108 110  CO2 21* 18* 18* 20* 19*  GLUCOSE 102* 190* 160* 141* 103*  BUN 29* 25* 17 9 8   CREATININE 0.80 0.80 0.81 0.53 0.66  CALCIUM 8.5* 8.6* 9.1 8.7* 8.6*   GFR: Estimated Creatinine Clearance: 85.8 mL/min (by C-G formula based on SCr of 0.66 mg/dL). Liver Function Tests: Recent Labs  Lab 01/14/19 1952  AST 18  ALT 12  ALKPHOS 109  BILITOT 0.6  PROT 8.9*  ALBUMIN 4.4   Recent Labs  Lab 01/14/19 1952  LIPASE 16   No results for input(s): AMMONIA in the last 168 hours. Coagulation Profile: No results for input(s): INR, PROTIME in the last 168 hours. Cardiac Enzymes: No results for input(s): CKTOTAL, CKMB, CKMBINDEX, TROPONINI in the last 168 hours. BNP (last 3 results) No results for input(s): PROBNP in the last 8760 hours. HbA1C: No results for input(s): HGBA1C in the last 72 hours. CBG: Recent Labs  Lab 01/18/19 1635 01/18/19 2207 01/19/19 0743 01/19/19 0808 01/19/19 1205  GLUCAP 228* 93 47* 106* 137*   Lipid Profile: No results for input(s): CHOL, HDL, LDLCALC, TRIG, CHOLHDL, LDLDIRECT in the last 72 hours. Thyroid Function Tests: No results for input(s): TSH, T4TOTAL, FREET4, T3FREE, THYROIDAB in the last 72 hours. Anemia Panel: No results for input(s): VITAMINB12, FOLATE, FERRITIN, TIBC, IRON, RETICCTPCT in the last 72 hours. Sepsis Labs: No results for input(s): PROCALCITON, LATICACIDVEN in the last 168 hours.  Recent Results (from the past 240 hour(s))  Urine culture     Status: Abnormal   Collection Time: 01/15/19 12:42 AM   Specimen: Urine, Clean Catch  Result Value Ref Range Status   Specimen Description   Final    URINE, CLEAN CATCH Performed at [facility], [place] [address], [place], [place] [address]    Special Requests   Final    NONE Performed at [facility], [place] [address], [place], [place] [address]    Culture MULTIPLE SPECIES PRESENT, SUGGEST RECOLLECTION (A)  Final   Report Status 01/16/2019 FINAL  Final  SARS CORONAVIRUS 2 (TAT 6-24 HRS) Nasopharyngeal Nasopharyngeal Swab     Status: None   Collection Time: 01/15/19  7:49 AM   Specimen: Nasopharyngeal Swab  Result Value Ref Range Status   SARS Coronavirus 2 NEGATIVE NEGATIVE Final    Comment: (NOTE) SARS-CoV-2 target nucleic acids are NOT DETECTED. The SARS-CoV-2 RNA is generally detectable in upper and lower respiratory specimens during the acute phase of infection. Negative results do not preclude SARS-CoV-2 infection, do not rule out co-infections with other pathogens, and should not be used as the sole basis for treatment or other patient management decisions. Negative results must be combined with clinical observations, patient history, and epidemiological information. The expected result is Negative. Fact Sheet for Patients: SugarRoll.be Fact Sheet for Healthcare Providers: https://www.woods-mathews.com/ This test is not yet approved or cleared by the [place] FDA and  has been authorized for detection and/or diagnosis of SARS-CoV-2 by FDA under an Emergency Use Authorization (EUA). This  EUA will remain  in effect (meaning this test can be used) for the duration of the COVID-19 declaration under Section 56 4(b)(1) of the Act, 21 U.S.C. section 360bbb-3(b)(1), unless the authorization is terminated or revoked sooner. Performed at [facility], [place] [address] [place], [place] [address]          Radiology Studies: No results found.      Scheduled Meds: . Chlorhexidine Gluconate Cloth  6 each Topical Daily  . enoxaparin (LOVENOX) injection  40 mg Subcutaneous Q24H  . insulin aspart  0-5 Units Subcutaneous QHS  . insulin aspart  0-9 Units Subcutaneous TID WC  . insulin aspart  4 Units Subcutaneous TID  WC  . metoCLOPramide (REGLAN) injection  10 mg Intravenous TID AC  . pantoprazole (PROTONIX) IV  40 mg Intravenous Q24H   Continuous Infusions: . sodium chloride 75 mL/hr at 01/19/19 0708     LOS: 3 days    Time spent: 25 mins, More than 50% of that time was spent in counseling and/or coordination of care.      Shelly Coss, MD [facility] Pager [phone]  If 7PM-7AM, please contact night-coverage [email or web address] Password [email or web address] 01/19/2019, 1:35 PM

## 2019-01-19 NOTE — Progress Notes (Signed)
Inpatient Diabetes Program Recommendations  AACE/ADA: New Consensus Statement on Inpatient Glycemic Control (2015)  Target Ranges:  Prepandial:   less than 140 mg/dL      Peak postprandial:   less than 180 mg/dL (1-2 hours)      Critically ill patients:  140 - 180 mg/dL   Lab Results  Component Value Date   GLUCAP 106 (H) 01/19/2019   HGBA1C 7.6 (H) 11/21/2018    Review of Glycemic Control  Diabetes history: DM1 Outpatient Diabetes medications: Tresiba 16 units QD, Novolog s/s Current orders for Inpatient glycemic control: Lantus 25 units QHS (d/ced 12/31), Novolog 0-9 units tidwc + 4 units tidwc  HgbA1C - 7.6% Hypoglycemia this am - 47 mg/dL  Inpatient Diabetes Program Recommendations:    Add Lantus 16 units QD  Will follow closely.  Thank you. Lorenda Peck, RD, LDN, CDE Inpatient Diabetes Coordinator [phone]

## 2019-01-20 DIAGNOSIS — R112 Nausea with vomiting, unspecified: Secondary | ICD-10-CM | POA: Diagnosis not present

## 2019-01-20 LAB — BASIC METABOLIC PANEL
Anion gap: 7 (ref 5–15)
BUN: 8 mg/dL (ref 6–20)
CO2: 22 mmol/L (ref 22–32)
Calcium: 8.1 mg/dL — ABNORMAL LOW (ref 8.9–10.3)
Chloride: 109 mmol/L (ref 98–111)
Creatinine, Ser: 0.64 mg/dL (ref 0.44–1.00)
GFR calc Af Amer: >60 mL/min (ref >60)
GFR calc non Af Amer: >60 mL/min (ref >60)
Glucose, Bld: 298 mg/dL — ABNORMAL HIGH (ref 70–99)
Potassium: 4.7 mmol/L (ref 3.5–5.1)
Sodium: 138 mmol/L (ref 135–145)

## 2019-01-20 LAB — GLUCOSE, CAPILLARY: Glucose-Capillary: 246 mg/dL — ABNORMAL HIGH (ref 70–99)

## 2019-01-20 MED ORDER — INSULIN ASPART 100 UNIT/ML ~~LOC~~ SOLN: 0.0000 [IU] | Freq: Three times a day (TID) | SUBCUTANEOUS | 11 refills | Status: DC

## 2019-01-20 MED ORDER — INSULIN GLARGINE 100 UNIT/ML ~~LOC~~ SOLN: 30.0000 [IU] | Freq: Every day | SUBCUTANEOUS | 3 refills | Status: DC

## 2019-01-20 NOTE — Discharge Summary (Signed)
Physician Discharge Summary  [name] JSH:[id number] DOB: 10-Aug-1990 DOA: 01/14/2019  PCP: Vicenta Aly, FNP  Admit date: 01/14/2019 Discharge date: 01/20/2019  Admitted From: Home Disposition:  Home  Discharge Condition:Stable CODE STATUS:FULL Diet recommendation: Carb modified  Brief/Interim Summary:  Patient is a 29 year old female with history of type 1 diabetes, GERD, asthma, diabetic  gastroparesis, hypertension, intractable cyclic vomiting syndrome, pancreatitis who presents to the emergency department with complaints of nausea, vomiting. On arrival she was found to be tachycardic, slightly hypertensive. She was admitted, managed and discharged on 12/29/2018 for the same problem.  She has numerous admissions in the past.  She states she is compliant with her insulin regimen at home.  Currently she is taking Lantus 30 units daily and sliding scale insulin.  Hospital course remarkable with persistentabdominal pain, nausea and has vomiting due to gastric gastroparesis.  This morning she feels much better and she is tolerating solid diet.  She has enough supply of insulin and Reglan at home.  She is stable for discharge to home today.  I have recommended her to follow-up with her endocrinologist as soon as possible and take small volume frequent meals for her diabetic gastroparesis.  Given her several admissions in the past, she has high risk of readmission in the future.  Following problems were addressed during his hospitalization:  DKA/DM1: Started on insulin drip and it has been stopped now.She follows with endocrinologist. At home,she is Currently she is on Levemir 30 units daily with sliding scale. She was previously on insulin pump which is not functioning at present. Her endocrinologist is arranging a new with insulin pump for her. Hemoglobin A1c was 7.6 on 11/21/2018 Continue current insulin regimen.  Nausea/vomiting/abdominal pain: Secondary to gastroparesis. On  reglan.  On her last admission,CT abdomen/pelvis showed possible pyelonephritis but she did not have any dysuria, no fever or leukocytosis or  costovertebral angle tenderness so antibiotics not indicated at that time. Urine culture had grown Candida. Presented here with abdominal pain, nausea and vomiting.  This is secondary to her gastroparesis.    Continue Reglan at home.  I have advised her to take small volume frequent meals.  Hypertension:On lisinopril at home   Asthma: Currently stable. No wheezing.  Hypokalemia:Stable now  Discharge Diagnoses:  Active Problems:   Nausea and vomiting    Discharge Instructions  Discharge Instructions    Diet Carb Modified   Complete by: As directed    Discharge instructions   Complete by: As directed    1)Please continue to monitor your blood sugars at home. 2)Continue taking Lantus and sliding scale insulin at home 3)Follow up with your endocrinologist as soon as possible. 4) You have diabetic gastroparesis.  Take small volume frequent meals. Large meal can trigger abdomen pain, nausea or vomiting.   Increase activity slowly   Complete by: As directed      Allergies as of 01/20/2019      Reactions   Peanut-containing Drug Products Swelling, Other (See Comments)   Reaction:  Swelling of mouth and lips    Food Swelling, Other (See Comments)   Pt is allergic to strawberries.   Reaction:  Swelling of mouth and lips    Ultram [tramadol] Itching      Medication List    STOP taking these medications   HYDROcodone-acetaminophen 5-325 MG tablet Commonly known as: NORCO/VICODIN   Tyler Aas FlexTouch 100 UNIT/ML Sopn FlexTouch Pen Generic drug: insulin degludec     TAKE these medications   albuterol 108 (90 Base)  MCG/ACT inhaler Commonly known as: VENTOLIN HFA Inhale 1-2 puffs into the lungs every 6 (six) hours as needed for wheezing or shortness of breath.   blood glucose meter kit and supplies Dispense based on patient and  insurance preference. Use up to four times daily as directed. (FOR ICD-10 E10.9, E11.9).   dicyclomine 20 MG tablet Commonly known as: BENTYL Take 1 tablet (20 mg total) by mouth every 8 (eight) hours as needed for spasms.   glucagon 1 MG injection Inject 1 mg into the muscle once as needed (For very low blood sugars. May repeat in 15-20 minutes if needed.).   insulin aspart 100 UNIT/ML injection Commonly known as: novoLOG Inject 0-9 Units into the skin 3 (three) times daily with meals. What changed:   how much to take  when to take this  additional instructions   insulin glargine 100 UNIT/ML injection Commonly known as: LANTUS Inject 0.3 mLs (30 Units total) into the skin daily.   Insulin Pen Needle 31G X 5 MM Misc 30 Units by Does not apply route at bedtime.   lisinopril 2.5 MG tablet Commonly known as: ZESTRIL Take 2.5 mg by mouth daily.   metoCLOPramide 10 MG tablet Commonly known as: REGLAN Take 1 tablet (10 mg total) by mouth 3 (three) times daily with meals.   naproxen 375 MG tablet Commonly known as: NAPROSYN Take 1 tablet (375 mg total) by mouth 2 (two) times daily as needed. What changed: reasons to take this   ondansetron 4 MG disintegrating tablet Commonly known as: Zofran ODT Take 1 tablet (4 mg total) by mouth every 8 (eight) hours as needed for nausea or vomiting.   promethazine 12.5 MG tablet Commonly known as: PHENERGAN Take 1 tablet (12.5 mg total) by mouth every 6 (six) hours as needed for nausea or vomiting.   traZODone 50 MG tablet Commonly known as: DESYREL Take 25-50 mg by mouth at bedtime as needed for sleep.   trimethoprim 100 MG tablet Commonly known as: TRIMPEX Take 100 mg by mouth daily.      Follow-up Information    Vicenta Aly, [place]. Schedule an appointment as soon as possible for a visit in 1 week(s).   Specialty: Nurse Practitioner Contact information: [place]  [phone]          Allergies  Allergen Reactions  . Peanut-Containing Drug Products Swelling and Other (See Comments)    Reaction:  Swelling of mouth and lips   . 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   Reaction:  Swelling of mouth and lips   . Ultram [Tramadol] Itching    Consultations:  None   Procedures/Studies: CT ABDOMEN PELVIS W CONTRAST  Result Date: 12/28/2018 CLINICAL DATA:  Nausea and vomiting, DKA EXAM: CT ABDOMEN AND PELVIS WITH CONTRAST TECHNIQUE: Multidetector CT imaging of the abdomen and pelvis was performed using the standard protocol following bolus administration of intravenous contrast. CONTRAST:  34m OMNIPAQUE IOHEXOL 300 MG/ML  SOLN COMPARISON:  10/04/2018 CT, 11/07/2018 ultrasound of the kidneys FINDINGS: Lower chest: No acute abnormality. Hepatobiliary: No focal liver abnormality is seen. Status post cholecystectomy. No biliary dilatation. Pancreas: Unremarkable. No pancreatic ductal dilatation or surrounding inflammatory changes. Spleen: Normal in size without focal abnormality. Adrenals/Urinary Tract: Adrenal glands are within normal limits. The left kidney demonstrates a normal enhancement pattern. No renal calculi or obstructive changes are seen. The bladder is well distended with wall thickening slightly increased  when compare with the prior exam. Correlate for underlying UTI. The right kidney demonstrates evidence of hydronephrosis and prominent extrarenal pelvis with hydroureter. No definitive obstructing stone is noted. The overall appearance has progressed somewhat in the interval from the prior CT examination. The degree of dilatation has worsened in the interval from a recent ultrasound examination from October of 2020. enhancement pattern in the right kidney demonstrates some focal decreased attenuation in the upper lobe medially slightly more prominent than that seen on the prior exam suspicious for  underlying pyelonephritis. Stomach/Bowel: The appendix is not well visualized. No inflammatory changes are seen to suggest appendicitis. Small sliding-type hiatal hernia is noted. Stomach is otherwise within normal limits. Fluid-filled loops of small bowel are seen although no obstructive changes are noted. Vascular/Lymphatic: No significant vascular findings are present. No enlarged abdominal or pelvic lymph nodes. Reproductive: The uterus demonstrates an enhancing lesion posteriorly likely representing a small fibroid. No adnexal mass is seen. Other: No abdominal wall hernia or abnormality. No abdominopelvic ascites. Musculoskeletal: No acute or significant osseous findings. IMPRESSION: Right-sided hydronephrosis similar to that seen on prior CT examination likely chronic in nature related to bladder wall thickening and a distended bladder. No obstructing stone or lesion is seen. The insertion site on the bladder may be low and contribute to these findings. Patchy enhancement pattern in the right kidney somewhat worsened when compared with the prior exam. This raises suspicion for underlying pyelonephritis. Correlation with urinalysis and white blood cell count is recommended. Small uterine fibroid. Electronically Signed   By: Inez Catalina M.D.   On: 12/28/2018 18:45   DG CHEST PORT 1 VIEW  Result Date: 12/24/2018 CLINICAL DATA:  Emesis since 430 this morning with abdominal pain, history of gastric paresis. EXAM: PORTABLE CHEST 1 VIEW COMPARISON:  11/14/2018 FINDINGS: The heart size and mediastinal contours are within normal limits. Both lungs are clear. The visualized skeletal structures are unremarkable. IMPRESSION: No acute cardiopulmonary disease. Electronically Signed   By: Zetta Bills M.D.   On: 12/24/2018 18:40   Acute Abd Series  Result Date: 12/21/2018 CLINICAL DATA:  Vomiting EXAM: DG ABDOMEN ACUTE W/ 1V CHEST COMPARISON:  10/11/2018 FINDINGS: Prior cholecystectomy. The bowel gas pattern is  normal. There is no evidence of free intraperitoneal air. No suspicious radio-opaque calculi or other significant radiographic abnormality is seen. Heart size and mediastinal contours are within normal limits. Both lungs are clear. IMPRESSION: Negative abdominal radiographs.  No acute cardiopulmonary disease. Electronically Signed   By: Rolm Baptise M.D.   On: 12/21/2018 21:29       Subjective:  Patient seen and examined the bedside this morning.  Hemodynamically stable for discharge today. Discharge Exam: Vitals:   01/19/19 2110 01/20/19 0516  BP: 125/76 (!) 142/98  Pulse: (!) 107 95  Resp: 20 18  Temp: 99.3 F (37.4 C) 98.4 F (36.9 C)  SpO2: 100% 100%   Vitals:   01/19/19 0510 01/19/19 1402 01/19/19 2110 01/20/19 0516  BP: 124/89 125/78 125/76 (!) 142/98  Pulse: 95 (!) 108 (!) 107 95  Resp: _0 Temp: 98.3 F (36.8 C) 99.9 F (37.7 C) 99.3 F (37.4 C) 98.4 F (36.9 C)  TempSrc: Oral Oral Oral Oral  SpO2: 100% 100% 100% 100%  Weight:      Height:        General: Pt is alert, awake, not in acute distress Cardiovascular: RRR, S1/S2 +, no rubs, no gallops Respiratory: CTA bilaterally, no wheezing, no rhonchi Abdominal: Soft,  NT, ND, bowel sounds + Extremities: no edema, no cyanosis    The results of significant diagnostics from this hospitalization (including imaging, microbiology, ancillary and laboratory) are listed below for reference.     Microbiology: Recent Results (from the past 240 hour(s))  Urine culture     Status: Abnormal   Collection Time: 01/15/19 12:42 AM   Specimen: Urine, Clean Catch  Result Value Ref Range Status   Specimen Description   Final    URINE, CLEAN CATCH Performed at [facility], [place] [address], [place], [place] [address]    Special Requests   Final    NONE Performed at [facility], [place] [address] [place], [place] [address]    Culture MULTIPLE SPECIES PRESENT, SUGGEST RECOLLECTION  (A)  Final   Report Status 01/16/2019 FINAL  Final  SARS CORONAVIRUS 2 (TAT 6-24 HRS) Nasopharyngeal Nasopharyngeal Swab     Status: None   Collection Time: 01/15/19  7:49 AM   Specimen: Nasopharyngeal Swab  Result Value Ref Range Status   SARS Coronavirus 2 NEGATIVE NEGATIVE Final    Comment: (NOTE) SARS-CoV-2 target nucleic acids are NOT DETECTED. The SARS-CoV-2 RNA is generally detectable in upper and lower respiratory specimens during the acute phase of infection. Negative results do not preclude SARS-CoV-2 infection, do not rule out co-infections with other pathogens, and should not be used as the sole basis for treatment or other patient management decisions. Negative results must be combined with clinical observations, patient history, and epidemiological information. The expected result is Negative. Fact Sheet for Patients: SugarRoll.be Fact Sheet for Healthcare Providers: https://www.woods-mathews.com/ This test is not yet approved or cleared by the [place] FDA and  has been authorized for detection and/or diagnosis of SARS-CoV-2 by FDA under an Emergency Use Authorization (EUA). This EUA will remain  in effect (meaning this test can be used) for the duration of the COVID-19 declaration under Section 56 4(b)(1) of the Act, 21 U.S.C. section 360bbb-3(b)(1), unless the authorization is terminated or revoked sooner. Performed at [facility], [place] [address], [place], [place] [address]      Labs: BNP (last 3 results) No results for input(s): BNP in the last 8760 hours. Basic Metabolic Panel: Recent Labs  Lab 01/16/19 0254 01/17/19 0447 01/18/19 0507 01/19/19 0509 01/20/19 0415  NA 137 138 137 139 138  K 3.9 5.0 3.7 5.1 4.7  CL 108 110 108 110 109  CO2 18* 18* 20* 19* 22  GLUCOSE 190* 160* 141* 103* 298*  BUN 25* _0 CREATININE 0.80 0.81 0.53 0.66 0.64  CALCIUM 8.6* 9.1 8.7* 8.6* 8.1*   Liver Function  Tests: Recent Labs  Lab 01/14/19 1952  AST 18  ALT 12  ALKPHOS 109  BILITOT 0.6  PROT 8.9*  ALBUMIN 4.4   Recent Labs  Lab 01/14/19 1952  LIPASE 16   No results for input(s): AMMONIA in the last 168 hours. CBC: Recent Labs  Lab 01/14/19 1952 01/15/19 0626 01/16/19 0254  WBC 8.3  --  5.9  HGB 12.7 11.2* 9.9*  HCT 42.8 33.0* 34.0*  MCV 88.6  --  89.2  PLT 489*  --  408*   Cardiac Enzymes: No results for input(s): CKTOTAL, CKMB, CKMBINDEX, TROPONINI in the last 168 hours. BNP: Invalid input(s): POCBNP CBG: Recent Labs  Lab 01/19/19 0808 01/19/19 1205 01/19/19 1723 01/19/19 2111 01/20/19 0730  GLUCAP 106* 137* 82 253* 246*   D-Dimer No results for input(s): DDIMER  in the last 72 hours. Hgb A1c No results for input(s): HGBA1C in the last 72 hours. Lipid Profile No results for input(s): CHOL, HDL, LDLCALC, TRIG, CHOLHDL, LDLDIRECT in the last 72 hours. Thyroid function studies No results for input(s): TSH, T4TOTAL, T3FREE, THYROIDAB in the last 72 hours.  Invalid input(s): FREET3 Anemia work up No results for input(s): VITAMINB12, FOLATE, FERRITIN, TIBC, IRON, RETICCTPCT in the last 72 hours. Urinalysis    Component Value Date/Time   COLORURINE YELLOW 01/15/2019 0042   APPEARANCEUR CLOUDY (A) 01/15/2019 0042   LABSPEC 1.023 01/15/2019 0042   PHURINE 6.0 01/15/2019 0042   GLUCOSEU >=500 (A) 01/15/2019 0042   GLUCOSEU >=1000 11/07/2012 1205   HGBUR MODERATE (A) 01/15/2019 0042   BILIRUBINUR NEGATIVE 01/15/2019 0042   KETONESUR 80 (A) 01/15/2019 0042   PROTEINUR 100 (A) 01/15/2019 0042   UROBILINOGEN 0.2 11/24/2014 1045   NITRITE NEGATIVE 01/15/2019 0042   LEUKOCYTESUR LARGE (A) 01/15/2019 0042   Sepsis Labs Invalid input(s): PROCALCITONIN,  WBC,  LACTICIDVEN Microbiology Recent Results (from the past 240 hour(s))  Urine culture     Status: Abnormal   Collection Time: 01/15/19 12:42 AM   Specimen: Urine, Clean Catch  Result Value Ref Range Status    Specimen Description   Final    URINE, CLEAN CATCH Performed at [facility], [place] [address], [place], [place] [address]    Special Requests   Final    NONE Performed at [facility], [place] [address] [place], [place] [address]    Culture MULTIPLE SPECIES PRESENT, SUGGEST RECOLLECTION (A)  Final   Report Status 01/16/2019 FINAL  Final  SARS CORONAVIRUS 2 (TAT 6-24 HRS) Nasopharyngeal Nasopharyngeal Swab     Status: None   Collection Time: 01/15/19  7:49 AM   Specimen: Nasopharyngeal Swab  Result Value Ref Range Status   SARS Coronavirus 2 NEGATIVE NEGATIVE Final    Comment: (NOTE) SARS-CoV-2 target nucleic acids are NOT DETECTED. The SARS-CoV-2 RNA is generally detectable in upper and lower respiratory specimens during the acute phase of infection. Negative results do not preclude SARS-CoV-2 infection, do not rule out co-infections with other pathogens, and should not be used as the sole basis for treatment or other patient management decisions. Negative results must be combined with clinical observations, patient history, and epidemiological information. The expected result is Negative. Fact Sheet for Patients: SugarRoll.be Fact Sheet for Healthcare Providers: https://www.woods-mathews.com/ This test is not yet approved or cleared by the [place] FDA and  has been authorized for detection and/or diagnosis of SARS-CoV-2 by FDA under an Emergency Use Authorization (EUA). This EUA will remain  in effect (meaning this test can be used) for the duration of the COVID-19 declaration under Section 56 4(b)(1) of the Act, 21 U.S.C. section 360bbb-3(b)(1), unless the authorization is terminated or revoked sooner. Performed at [facility], [place] [address] [place], [place] [address]     Please note: You were cared for by a hospitalist during your hospital stay. Once you are discharged, your  primary care physician will handle any further medical issues. Please note that NO REFILLS for any discharge medications will be authorized once you are discharged, as it is imperative that you return to your primary care physician (or establish a relationship with a primary care physician if you do not have one) for your post hospital discharge needs so that they can reassess your need for medications and monitor your lab values.    Time coordinating discharge:  40 minutes  SIGNED:   Shelly Coss, MD  [facility] 01/20/2019, 10:25 AM Pager [phone]  If 7PM-7AM, please contact night-coverage [email or web address] Password [email or web address]

## 2019-01-20 NOTE — Plan of Care (Signed)
Pt ready for DC home 

## 2019-01-20 NOTE — Progress Notes (Signed)
Pt's right hand became "puffy" from her IV, IV removed, Pt request IV not be restarted as she plans on D/C today.

## 2019-01-21 ENCOUNTER — Other Ambulatory Visit: Payer: Self-pay

## 2019-01-21 ENCOUNTER — Encounter (HOSPITAL_COMMUNITY): Payer: Self-pay

## 2019-01-21 DIAGNOSIS — J45909 Unspecified asthma, uncomplicated: Secondary | ICD-10-CM | POA: Insufficient documentation

## 2019-01-21 DIAGNOSIS — Z79899 Other long term (current) drug therapy: Secondary | ICD-10-CM | POA: Diagnosis not present

## 2019-01-21 DIAGNOSIS — E109 Type 1 diabetes mellitus without complications: Secondary | ICD-10-CM | POA: Diagnosis not present

## 2019-01-21 DIAGNOSIS — Z91010 Allergy to peanuts: Secondary | ICD-10-CM | POA: Insufficient documentation

## 2019-01-21 DIAGNOSIS — R112 Nausea with vomiting, unspecified: Secondary | ICD-10-CM | POA: Insufficient documentation

## 2019-01-21 DIAGNOSIS — I10 Essential (primary) hypertension: Secondary | ICD-10-CM | POA: Insufficient documentation

## 2019-01-21 DIAGNOSIS — R109 Unspecified abdominal pain: Secondary | ICD-10-CM | POA: Diagnosis not present

## 2019-01-21 MED ORDER — SODIUM CHLORIDE 0.9% FLUSH
3.0000 mL | Freq: Once | INTRAVENOUS | Status: AC
  Administered 2019-01-22: 3 mL via INTRAVENOUS

## 2019-01-21 NOTE — ED Triage Notes (Signed)
Pt reports generalized abdominal pain and vomiting starting this morning. States that she has been unable to keep any medication down. States sugars have been running in the 100s.

## 2019-01-22 ENCOUNTER — Emergency Department (HOSPITAL_COMMUNITY)
Admission: EM | Admit: 2019-01-22 | Discharge: 2019-01-22 | Disposition: A | Discharge status: Home / Self Care | Payer: BC Managed Care – PPO | Attending: Emergency Medicine | Admitting: Emergency Medicine

## 2019-01-22 DIAGNOSIS — R112 Nausea with vomiting, unspecified: Secondary | ICD-10-CM

## 2019-01-22 LAB — COMPREHENSIVE METABOLIC PANEL
ALT: 12 U/L (ref 0–44)
AST: 20 U/L (ref 15–41)
Albumin: 4.1 g/dL (ref 3.5–5.0)
Alkaline Phosphatase: 103 U/L (ref 38–126)
Anion gap: 15 (ref 5–15)
BUN: 8 mg/dL (ref 6–20)
CO2: 25 mmol/L (ref 22–32)
Calcium: 9.4 mg/dL (ref 8.9–10.3)
Chloride: 99 mmol/L (ref 98–111)
Creatinine, Ser: 0.76 mg/dL (ref 0.44–1.00)
GFR calc Af Amer: >60 mL/min (ref >60)
GFR calc non Af Amer: >60 mL/min (ref >60)
Glucose, Bld: 315 mg/dL — ABNORMAL HIGH (ref 70–99)
Potassium: 4 mmol/L (ref 3.5–5.1)
Sodium: 139 mmol/L (ref 135–145)
Total Bilirubin: 1 mg/dL (ref 0.3–1.2)
Total Protein: 8.3 g/dL — ABNORMAL HIGH (ref 6.5–8.1)

## 2019-01-22 LAB — BLOOD GAS, VENOUS
Bicarbonate: 27.8 mmol/L (ref 20.0–28.0)
O2 Saturation: 53.8 %
Patient temperature: 98.6
pCO2, Ven: 48.3 mmHg (ref 44.0–60.0)
pH, Ven: 7.378 (ref 7.250–7.430)
pO2, Ven: 31.3 mmHg — CL (ref 32.0–45.0)

## 2019-01-22 LAB — URINALYSIS, MICROSCOPIC (REFLEX): WBC, UA: >50 WBC/hpf (ref 0–5)

## 2019-01-22 LAB — I-STAT CHEM 8, ED
BUN: 7 mg/dL (ref 6–20)
Calcium, Ion: 1.1 mmol/L — ABNORMAL LOW (ref 1.15–1.40)
Chloride: 100 mmol/L (ref 98–111)
Creatinine, Ser: 0.6 mg/dL (ref 0.44–1.00)
Glucose, Bld: 316 mg/dL — ABNORMAL HIGH (ref 70–99)
HCT: 36 % (ref 36.0–46.0)
Hemoglobin: 12.2 g/dL (ref 12.0–15.0)
Potassium: 3.6 mmol/L (ref 3.5–5.1)
Sodium: 139 mmol/L (ref 135–145)
TCO2: 29 mmol/L (ref 22–32)

## 2019-01-22 LAB — CBC
HCT: 35.4 % — ABNORMAL LOW (ref 36.0–46.0)
Hemoglobin: 11 g/dL — ABNORMAL LOW (ref 12.0–15.0)
MCH: 26.3 pg (ref 26.0–34.0)
MCHC: 31.1 g/dL (ref 30.0–36.0)
MCV: 84.7 fL (ref 80.0–100.0)
Platelets: 462 10*3/uL — ABNORMAL HIGH (ref 150–400)
RBC: 4.18 MIL/uL (ref 3.87–5.11)
RDW: 14.5 % (ref 11.5–15.5)
WBC: 8.9 10*3/uL (ref 4.0–10.5)
nRBC: 0 % (ref 0.0–0.2)

## 2019-01-22 LAB — LIPASE, BLOOD: Lipase: 13 U/L (ref 11–51)

## 2019-01-22 LAB — URINALYSIS, ROUTINE W REFLEX MICROSCOPIC
Bilirubin Urine: NEGATIVE
Glucose, UA: >=500 mg/dL — AB
Ketones, ur: >80 mg/dL — AB
Nitrite: NEGATIVE
Protein, ur: 100 mg/dL — AB
Specific Gravity, Urine: 1.025 (ref 1.005–1.030)
pH: 6 (ref 5.0–8.0)

## 2019-01-22 LAB — I-STAT BETA HCG BLOOD, ED (MC, WL, AP ONLY): I-stat hCG, quantitative: <5 m[IU]/mL (ref <5)

## 2019-01-22 LAB — CBG MONITORING, ED: Glucose-Capillary: 288 mg/dL — ABNORMAL HIGH (ref 70–99)

## 2019-01-22 MED ORDER — MORPHINE SULFATE (PF) 4 MG/ML IV SOLN
4.0000 mg | Freq: Once | INTRAVENOUS | Status: AC
  Administered 2019-01-22: 4 mg via INTRAVENOUS
  Filled 2019-01-22: qty 1

## 2019-01-22 MED ORDER — PROMETHAZINE HCL 25 MG/ML IJ SOLN
25.0000 mg | Freq: Once | INTRAMUSCULAR | Status: AC
  Administered 2019-01-22: 25 mg via INTRAVENOUS
  Filled 2019-01-22: qty 1

## 2019-01-22 MED ORDER — HALOPERIDOL 1 MG PO TABS
2.0000 mg | ORAL_TABLET | Freq: Once | ORAL | Status: AC
  Administered 2019-01-22: 2 mg via ORAL
  Filled 2019-01-22: qty 2

## 2019-01-22 MED ORDER — SODIUM CHLORIDE 0.9 % IV BOLUS
1000.0000 mL | Freq: Once | INTRAVENOUS | Status: AC
  Administered 2019-01-22: 1000 mL via INTRAVENOUS

## 2019-01-22 MED ORDER — PROMETHAZINE HCL 25 MG RE SUPP: 25.0000 mg | Freq: Four times a day (QID) | RECTAL | 0 refills | Status: DC | PRN

## 2019-01-22 NOTE — ED Notes (Signed)
Urine culture sent to the lab. 

## 2019-01-22 NOTE — ED Notes (Signed)
Patient ambulatory to restroom without assistance. 

## 2019-01-22 NOTE — ED Notes (Signed)
Refused blood work in triage.

## 2019-01-22 NOTE — ED Notes (Signed)
Attempted IV x2 and was unsuccessful. Will consult IV team.

## 2019-01-22 NOTE — ED Provider Notes (Signed)
[facility] DEPT Provider Note   CSN: [id number] Arrival date & time: 01/21/19  1836     History Chief Complaint  Patient presents with  . Abdominal Pain    [name] is a 29 y.o. female.  Patient presents to the emergency department with a chief complaint of nausea and vomiting.  She was just discharged on 1/1 after having been admitted for DKA and persistent vomiting.  She also has history of gastroparesis.  She states that the vomiting began again today.  She denies any fevers.  Denies any successful treatments prior to arrival.  The history is provided by the patient. No language interpreter was used.       Past Medical History:  Diagnosis Date  . Anxiety   . Arthritis    "hands, feet, knees" (12/18/2016)  . Asthma   . Diabetic gastroparesis ([place])    Per gastric emptying study 07/09/16 which showed significant delayed gastric emptying.  . Gallstones   . Gastroparesis   . GERD (gastroesophageal reflux disease)   . Heart murmur   . Hepatic steatosis 11/26/2014   and hepatomegaly  . Hypertension    hx (12/18/2016)  . Intractable cyclical vomiting syndrome    Archie Endo 12/18/2016  . Liver mass 11/26/2014  . Pancreatitis, acute 11/26/2014  . Pneumonia    "as a teen X 1" (12/18/2016)  . Type I diabetes mellitus ([place]) 2007   IDDM.  poorly controlled, multiple admits with DKA    Patient Active Problem List   Diagnosis Date Noted  . Gastroparesis due to secondary diabetes ([place]) 11/21/2018  . Diabetes mellitus ([place]) 11/13/2018  . HTN (hypertension) 11/13/2018  . Marijuana abuse 11/13/2018  . Acute pyelonephritis 10/07/2018  . Hydronephrosis of right kidney 10/05/2018  . Diffuse pain   . Elevated blood pressure reading 12/03/2017  . Tachycardia 11/30/2017  . Hypernatremia 11/02/2017  . Nausea and vomiting 01/22/2017  . Nausea & vomiting 01/21/2017  . Intractable cyclical vomiting syndrome 12/18/2016  . Leukocytosis 10/24/2016  .  N&V (nausea and vomiting) 10/23/2016  . DKA, type 1 ([place]) 09/26/2016  . Thrombocytosis ([place]) 07/08/2016  . Candiduria, asymptomatic 06/23/2016  . Hyponatremia 06/13/2016  . Hematuria 05/13/2016  . UTI (urinary tract infection) 01/22/2016  . Diarrhea 11/09/2015  . Acute urinary retention   . Gastroparesis due to DM ([place]) 07/10/2015  . GERD (gastroesophageal reflux disease) 07/10/2015  . Depression with anxiety 07/10/2015  . Gastroparesis 06/22/2015  . Altered mental status 06/22/2015  . Volume depletion 06/10/2015  . Protein-calorie malnutrition, severe 06/10/2015  . Hyperglycemia   . Elevated bilirubin   . Hematemesis with nausea   . Intractable nausea and vomiting 05/28/2015  . Abdominal pain in female   . Abdominal pain 05/24/2015  . Hypertension 05/24/2015  . Dehydration   . Diabetic gastroparesis ([place])   . Chronic diastolic heart failure ([place]) 04/11/2015  . Hematemesis 04/08/2015  . DKA (diabetic ketoacidoses) ([place]) 03/22/2015  . S/P laparoscopic cholecystectomy 02/11/2015  . Postextubation stridor   . Pancreatitis, acute 11/26/2014  . Volume overload 11/26/2014  . Hypokalemia 11/26/2014  . Hepatic steatosis 11/26/2014  . Liver mass 11/26/2014  . Sepsis ([place]) 11/25/2014  . Sinus tachycardia 11/25/2014  . Hypomagnesemia 11/25/2014  . Hypophosphatemia 11/25/2014  . Elevated LFTs 11/24/2014  . AKI (acute kidney injury) ([place]) 11/24/2014  . Migraine headache 11/24/2014  . Asthma 06/29/2012  . Uncontrolled type 1 diabetes mellitus ([place]) 06/19/2010  . Goiter, unspecified 06/19/2010    Past Surgical History:  Procedure Laterality Date  . CHOLECYSTECTOMY N/A 02/11/2015   Procedure: LAPAROSCOPIC CHOLECYSTECTOMY WITH INTRAOPERATIVE CHOLANGIOGRAM;  Surgeon: Greer Pickerel, MD;  Location: [facility];  Service: General;  Laterality: N/A;  . ESOPHAGOGASTRODUODENOSCOPY (EGD) WITH PROPOFOL Left 09/20/2014   Procedure: ESOPHAGOGASTRODUODENOSCOPY (EGD) WITH PROPOFOL;  Surgeon: Arta Silence, MD;  Location: [facility] ENDOSCOPY;  Service: Endoscopy;  Laterality: Left;  . WISDOM TOOTH EXTRACTION       OB History    Gravida  2   Para  1   Term  0   Preterm  1   AB  1   Living  1     SAB  0   TAB  1   Ectopic  0   Multiple  0   Live Births  1           Family History  Problem Relation Age of Onset  . Heart disease Maternal Grandmother   . Heart disease Maternal Grandfather   . Diabetes Mother   . Hyperlipidemia Mother   . Hypertension Father   . Heart disease Father   . Hypertension Paternal Grandmother   . Cancer Paternal Grandfather     Social History   Tobacco Use  . Smoking status: Never Smoker  . Smokeless tobacco: Never Used  Substance Use Topics  . Alcohol use: No  . Drug use: Yes    Types: Marijuana    Home Medications Prior to Admission medications   Medication Sig Start Date End Date Taking? Authorizing Provider  albuterol (PROVENTIL HFA;VENTOLIN HFA) 108 (90 Base) MCG/ACT inhaler Inhale 1-2 puffs into the lungs every 6 (six) hours as needed for wheezing or shortness of breath.    [provider]  blood glucose meter kit and supplies Dispense based on patient and insurance preference. Use up to four times daily as directed. (FOR ICD-10 E10.9, E11.9). 12/07/17   Eugenie Filler, MD  dicyclomine (BENTYL) 20 MG tablet Take 1 tablet (20 mg total) by mouth every 8 (eight) hours as needed for spasms. 12/31/18   Petrucelli, Samantha R, PA-C  glucagon (GLUCAGON EMERGENCY) 1 MG injection Inject 1 mg into the muscle once as needed (For very low blood sugars. May repeat in 15-20 minutes if needed.). 12/20/16   Hongalgi, Lenis Dickinson, MD  insulin aspart (NOVOLOG) 100 UNIT/ML injection Inject 0-9 Units into the skin 3 (three) times daily with meals. 01/20/19   Shelly Coss, MD  insulin glargine (LANTUS) 100 UNIT/ML injection Inject 0.3 mLs (30 Units total) into the skin daily. 01/20/19   Shelly Coss, MD  Insulin Pen Needle 31G X 5 MM  MISC 30 Units by Does not apply route at bedtime. 12/07/17   Eugenie Filler, MD  lisinopril (ZESTRIL) 2.5 MG tablet Take 2.5 mg by mouth daily.    [provider]  metoCLOPramide (REGLAN) 10 MG tablet Take 1 tablet (10 mg total) by mouth 3 (three) times daily with meals. 11/18/18 11/18/19  Kathie Dike, MD  naproxen (NAPROSYN) 375 MG tablet Take 1 tablet (375 mg total) by mouth 2 (two) times daily as needed. Patient taking differently: Take 375 mg by mouth 2 (two) times daily as needed for moderate pain.  12/12/18   Albrizze, Kaitlyn E, PA-C  ondansetron (ZOFRAN ODT) 4 MG disintegrating tablet Take 1 tablet (4 mg total) by mouth every 8 (eight) hours as needed for nausea or vomiting. 11/18/18   Kathie Dike, MD  promethazine (PHENERGAN) 12.5 MG tablet Take 1 tablet (12.5 mg total) by mouth every  6 (six) hours as needed for nausea or vomiting. 12/31/18   Petrucelli, Glynda Jaeger, PA-C  traZODone (DESYREL) 50 MG tablet Take 25-50 mg by mouth at bedtime as needed for sleep.  11/30/18   [provider]  trimethoprim (TRIMPEX) 100 MG tablet Take 100 mg by mouth daily. 11/11/18   [provider]    Allergies    Peanut-containing drug products, Food, and Ultram [tramadol]  Review of Systems   Review of Systems  All other systems reviewed and are negative.   Physical Exam Updated Vital Signs BP (!) 120/95 (BP Location: Left Arm)   Pulse (!) 122   Temp 99.5 F (37.5 C) (Oral)   Resp 16   SpO2 98%   Physical Exam Vitals and nursing note reviewed.  Constitutional:      General: She is not in acute distress.    Appearance: She is well-developed.     Comments: vomiting  HENT:     Head: Normocephalic and atraumatic.  Eyes:     Conjunctiva/sclera: Conjunctivae normal.  Cardiovascular:     Rate and Rhythm: Normal rate and regular rhythm.     Heart sounds: No murmur.  Pulmonary:     Effort: Pulmonary effort is normal. No respiratory distress.     Breath  sounds: Normal breath sounds.  Abdominal:     Palpations: Abdomen is soft.     Tenderness: There is no abdominal tenderness.  Musculoskeletal:        General: Normal range of motion.     Cervical back: Neck supple.  Skin:    General: Skin is warm and dry.  Neurological:     Mental Status: She is alert and oriented to person, place, and time.  Psychiatric:        Mood and Affect: Mood normal.        Behavior: Behavior normal.     ED Results / Procedures / Treatments   Labs (all labs ordered are listed, but only abnormal results are displayed) Labs Reviewed  CBG MONITORING, ED - Abnormal; Notable for the following components:      Result Value   Glucose-Capillary 288 (*)    All other components within normal limits  I-STAT CHEM 8, ED - Abnormal; Notable for the following components:   Glucose, Bld 316 (*)    Calcium, Ion 1.10 (*)    All other components within normal limits  LIPASE, BLOOD  COMPREHENSIVE METABOLIC PANEL  CBC  URINALYSIS, ROUTINE W REFLEX MICROSCOPIC  BLOOD GAS, VENOUS  I-STAT BETA HCG BLOOD, ED ([facility], [facility], [facility] ONLY)    EKG None  Radiology No results found.  Procedures Ultrasound ED Peripheral IV (Provider)  Date/Time: 01/22/2019 3:57 AM Performed by: Montine Circle, PA-C Authorized by: Montine Circle, PA-C   Procedure details:    Indications: hydration, multiple failed IV attempts and poor IV access     Skin Prep: chlorhexidine gluconate     Location: right basilic.   Angiocath:  20 G   Bedside Ultrasound Guided: Yes     Images: not archived     Patient tolerated procedure without complications: Yes     Dressing applied: Yes     (including critical care time)  Medications Ordered in ED Medications  sodium chloride flush (NS) 0.9 % injection 3 mL (has no administration in time range)  sodium chloride 0.9 % bolus 1,000 mL (has no administration in time range)  promethazine (PHENERGAN) injection 25 mg (has no administration in time range)   morphine  4 MG/ML injection 4 mg (has no administration in time range)    ED Course  I have reviewed the triage vital signs and the nursing notes.  Pertinent labs & imaging results that were available during my care of the patient were reviewed by me and considered in my medical decision making (see chart for details).    MDM Rules/Calculators/A&P                      Patient recently admitted for DKA and vomiting, was discharged on 1/1.  Patient reports that she felt slightly improved, but began vomiting again today.  Her glucose noted be 316.  Her venous pH is 7.37, improved from 7.19 during her most recent admission.  She did not have a leukocytosis.  Her potassium is 4.0.  She is noted to be tachycardic, but afebrile.  Urinalysis is notable for ketones, large hemoglobin, moderate leuks, and greater than 50 white blood cells with few bacteria.  She just completed her menstrual cycle.  Question UTI.  Will send urine for culture.  She is noted to be tachycardic to the 110s and 120s.  Will give fluids.  Will give nausea medicine and some pain medicine.   Patient's heart rate is improving with fluids.  Lab work-up looks better than priors.  I do not think that the patient requires admission.  Seen by and discussed with Dr. Dina Rich, who agrees with plan for discharge.  Final Clinical Impression(s) / ED Diagnoses Final diagnoses:  Nausea and vomiting, intractability of vomiting not specified, unspecified vomiting type    Rx / DC Orders ED Discharge Orders    None       Montine Circle, PA-C 01/22/19 0549    Merryl Hacker, MD 01/23/19 [phone]

## 2019-01-23 LAB — URINE CULTURE

## 2019-02-05 DIAGNOSIS — D638 Anemia in other chronic diseases classified elsewhere: Secondary | ICD-10-CM | POA: Insufficient documentation

## 2019-02-16 ENCOUNTER — Emergency Department (HOSPITAL_COMMUNITY): Payer: BC Managed Care – PPO

## 2019-02-16 ENCOUNTER — Encounter (HOSPITAL_COMMUNITY): Payer: Self-pay | Admitting: Emergency Medicine

## 2019-02-16 ENCOUNTER — Emergency Department (HOSPITAL_COMMUNITY)
Admission: EM | Admit: 2019-02-16 | Discharge: 2019-02-16 | Disposition: A | Discharge status: Home / Self Care | Payer: BC Managed Care – PPO | Attending: Emergency Medicine | Admitting: Emergency Medicine

## 2019-02-16 DIAGNOSIS — I5032 Chronic diastolic (congestive) heart failure: Secondary | ICD-10-CM | POA: Insufficient documentation

## 2019-02-16 DIAGNOSIS — J45909 Unspecified asthma, uncomplicated: Secondary | ICD-10-CM | POA: Diagnosis not present

## 2019-02-16 DIAGNOSIS — E109 Type 1 diabetes mellitus without complications: Secondary | ICD-10-CM | POA: Insufficient documentation

## 2019-02-16 DIAGNOSIS — I110 Hypertensive heart disease with heart failure: Secondary | ICD-10-CM | POA: Insufficient documentation

## 2019-02-16 DIAGNOSIS — Z79899 Other long term (current) drug therapy: Secondary | ICD-10-CM | POA: Diagnosis not present

## 2019-02-16 DIAGNOSIS — R1084 Generalized abdominal pain: Secondary | ICD-10-CM | POA: Diagnosis present

## 2019-02-16 DIAGNOSIS — K3184 Gastroparesis: Secondary | ICD-10-CM | POA: Diagnosis not present

## 2019-02-16 DIAGNOSIS — G8929 Other chronic pain: Secondary | ICD-10-CM

## 2019-02-16 DIAGNOSIS — Z91010 Allergy to peanuts: Secondary | ICD-10-CM | POA: Diagnosis not present

## 2019-02-16 LAB — CBC WITH DIFFERENTIAL/PLATELET
Abs Immature Granulocytes: 0.02 10*3/uL (ref 0.00–0.07)
Basophils Absolute: 0 10*3/uL (ref 0.0–0.1)
Basophils Relative: 0 %
Eosinophils Absolute: 0.2 10*3/uL (ref 0.0–0.5)
Eosinophils Relative: 2 %
HCT: 31.4 % — ABNORMAL LOW (ref 36.0–46.0)
Hemoglobin: 9.6 g/dL — ABNORMAL LOW (ref 12.0–15.0)
Immature Granulocytes: 0 %
Lymphocytes Relative: 17 %
Lymphs Abs: 1.7 10*3/uL (ref 0.7–4.0)
MCH: 25.8 pg — ABNORMAL LOW (ref 26.0–34.0)
MCHC: 30.6 g/dL (ref 30.0–36.0)
MCV: 84.4 fL (ref 80.0–100.0)
Monocytes Absolute: 0.4 10*3/uL (ref 0.1–1.0)
Monocytes Relative: 4 %
Neutro Abs: 7.7 10*3/uL (ref 1.7–7.7)
Neutrophils Relative %: 77 %
Platelets: 473 10*3/uL — ABNORMAL HIGH (ref 150–400)
RBC: 3.72 MIL/uL — ABNORMAL LOW (ref 3.87–5.11)
RDW: 15.6 % — ABNORMAL HIGH (ref 11.5–15.5)
WBC: 10.1 10*3/uL (ref 4.0–10.5)
nRBC: 0 % (ref 0.0–0.2)

## 2019-02-16 LAB — BLOOD GAS, VENOUS
Acid-Base Excess: 4.6 mmol/L — ABNORMAL HIGH (ref 0.0–2.0)
Bicarbonate: 29.9 mmol/L — ABNORMAL HIGH (ref 20.0–28.0)
O2 Saturation: 65.6 %
Patient temperature: 98.6
pCO2, Ven: 51.3 mmHg (ref 44.0–60.0)
pH, Ven: 7.384 (ref 7.250–7.430)
pO2, Ven: 38.5 mmHg (ref 32.0–45.0)

## 2019-02-16 LAB — COMPREHENSIVE METABOLIC PANEL
ALT: 10 U/L (ref 0–44)
AST: 16 U/L (ref 15–41)
Albumin: 4 g/dL (ref 3.5–5.0)
Alkaline Phosphatase: 69 U/L (ref 38–126)
Anion gap: 7 (ref 5–15)
BUN: 20 mg/dL (ref 6–20)
CO2: 29 mmol/L (ref 22–32)
Calcium: 9.3 mg/dL (ref 8.9–10.3)
Chloride: 104 mmol/L (ref 98–111)
Creatinine, Ser: 0.69 mg/dL (ref 0.44–1.00)
GFR calc Af Amer: >60 mL/min (ref >60)
GFR calc non Af Amer: >60 mL/min (ref >60)
Glucose, Bld: 151 mg/dL — ABNORMAL HIGH (ref 70–99)
Potassium: 3.8 mmol/L (ref 3.5–5.1)
Sodium: 140 mmol/L (ref 135–145)
Total Bilirubin: 0.7 mg/dL (ref 0.3–1.2)
Total Protein: 7.2 g/dL (ref 6.5–8.1)

## 2019-02-16 LAB — LIPASE, BLOOD: Lipase: 15 U/L (ref 11–51)

## 2019-02-16 LAB — I-STAT BETA HCG BLOOD, ED (MC, WL, AP ONLY): I-stat hCG, quantitative: <5 m[IU]/mL (ref <5)

## 2019-02-16 MED ORDER — PROMETHAZINE HCL 25 MG/ML IJ SOLN
25.0000 mg | Freq: Once | INTRAMUSCULAR | Status: AC
  Administered 2019-02-16: 25 mg via INTRAVENOUS
  Filled 2019-02-16: qty 1

## 2019-02-16 MED ORDER — SENNOSIDES-DOCUSATE SODIUM 8.6-50 MG PO TABS
2.00 | ORAL_TABLET | ORAL | Status: DC
Start: 2019-02-14 — End: 2019-02-16

## 2019-02-16 MED ORDER — DIPHENHYDRAMINE HCL 50 MG/ML IJ SOLN
25.0000 mg | Freq: Once | INTRAMUSCULAR | Status: AC
  Administered 2019-02-16: 25 mg via INTRAVENOUS
  Filled 2019-02-16: qty 1

## 2019-02-16 MED ORDER — INSULIN LISPRO 100 UNIT/ML ~~LOC~~ SOLN
0.00 | SUBCUTANEOUS | Status: DC
Start: 2019-02-14 — End: 2019-02-16

## 2019-02-16 MED ORDER — INSULIN GLARGINE 100 UNIT/ML ~~LOC~~ SOLN
30.00 | SUBCUTANEOUS | Status: DC
Start: ? — End: 2019-02-16

## 2019-02-16 MED ORDER — OXYCODONE HCL 5 MG PO TABS
5.00 | ORAL_TABLET | ORAL | Status: DC
Start: ? — End: 2019-02-16

## 2019-02-16 MED ORDER — HYDROMORPHONE HCL 1 MG/ML IJ SOLN
0.50 | INTRAMUSCULAR | Status: DC
Start: ? — End: 2019-02-16

## 2019-02-16 MED ORDER — HEPARIN SOD (PORK) LOCK FLUSH 100 UNIT/ML IV SOLN
500.0000 [IU] | Freq: Once | INTRAVENOUS | Status: AC
  Administered 2019-02-16: 500 [IU]
  Filled 2019-02-16: qty 5

## 2019-02-16 MED ORDER — PROMETHAZINE HCL 25 MG/ML IJ SOLN
12.5000 mg | Freq: Once | INTRAMUSCULAR | Status: AC
  Administered 2019-02-16: 12.5 mg via INTRAVENOUS
  Filled 2019-02-16: qty 1

## 2019-02-16 MED ORDER — PANTOPRAZOLE SODIUM 40 MG PO TBEC
40.00 | DELAYED_RELEASE_TABLET | ORAL | Status: DC
Start: 2019-02-15 — End: 2019-02-16

## 2019-02-16 MED ORDER — HALOPERIDOL LACTATE 5 MG/ML IJ SOLN
2.0000 mg | Freq: Once | INTRAMUSCULAR | Status: AC
  Administered 2019-02-16: 2 mg via INTRAVENOUS
  Filled 2019-02-16: qty 1

## 2019-02-16 MED ORDER — ERYTHROMYCIN BASE 250 MG PO TBEC
250.00 | DELAYED_RELEASE_TABLET | ORAL | Status: DC
Start: 2019-02-14 — End: 2019-02-16

## 2019-02-16 MED ORDER — TRAZODONE HCL 50 MG PO TABS
25.00 | ORAL_TABLET | ORAL | Status: DC
Start: ? — End: 2019-02-16

## 2019-02-16 MED ORDER — HYDROMORPHONE HCL 1 MG/ML IJ SOLN
1.0000 mg | Freq: Once | INTRAMUSCULAR | Status: AC
  Administered 2019-02-16: 1 mg via INTRAVENOUS
  Filled 2019-02-16: qty 1

## 2019-02-16 MED ORDER — BISACODYL 10 MG RE SUPP
10.00 | RECTAL | Status: DC
Start: ? — End: 2019-02-16

## 2019-02-16 MED ORDER — DROPERIDOL 2.5 MG/ML IJ SOLN
1.2500 mg | Freq: Once | INTRAMUSCULAR | Status: AC
  Administered 2019-02-16: 1.25 mg via INTRAVENOUS
  Filled 2019-02-16: qty 2

## 2019-02-16 MED ORDER — LISINOPRIL 5 MG PO TABS
2.50 | ORAL_TABLET | ORAL | Status: DC
Start: 2019-02-15 — End: 2019-02-16

## 2019-02-16 MED ORDER — SODIUM CHLORIDE 0.9 % IV BOLUS
1000.0000 mL | Freq: Once | INTRAVENOUS | Status: AC
  Administered 2019-02-16: 1000 mL via INTRAVENOUS

## 2019-02-16 MED ORDER — LIDOCAINE HCL 1 % IJ SOLN
0.50 | INTRAMUSCULAR | Status: DC
Start: ? — End: 2019-02-16

## 2019-02-16 MED ORDER — POLYETHYLENE GLYCOL 3350 17 GM/SCOOP PO POWD
17.00 | ORAL | Status: DC
Start: 2019-02-15 — End: 2019-02-16

## 2019-02-16 MED ORDER — ACETAMINOPHEN 325 MG PO TABS
650.00 | ORAL_TABLET | ORAL | Status: DC
Start: ? — End: 2019-02-16

## 2019-02-16 MED ORDER — METOCLOPRAMIDE HCL 10 MG PO TABS
10.00 | ORAL_TABLET | ORAL | Status: DC
Start: 2019-02-14 — End: 2019-02-16

## 2019-02-16 MED ORDER — SODIUM CHLORIDE 0.9 % IV SOLN: INTRAVENOUS | Status: DC

## 2019-02-16 MED ORDER — PROMETHAZINE HCL 25 MG PO TABS
12.50 | ORAL_TABLET | ORAL | Status: DC
Start: ? — End: 2019-02-16

## 2019-02-16 MED ORDER — INSULIN LISPRO 100 UNIT/ML ~~LOC~~ SOLN
1.00 | SUBCUTANEOUS | Status: DC
Start: 2019-02-14 — End: 2019-02-16

## 2019-02-16 NOTE — ED Provider Notes (Signed)
[facility] DEPT Provider Note   CSN: [id number] Arrival date & time: 02/16/19  1246     History Chief Complaint  Patient presents with  . Emesis  . Abdominal Pain    [name] is a 29 y.o. female.  HPI   Patient presents to the ED with complaints of recurrent nausea vomiting and abdominal pain that started this morning.  Patient states she is having diffuse abdominal pain.  She has had multiple episodes of vomiting.  She is unable to get comfortable and this feels very similar to her prior bouts of gastroparesis.  She denies any trouble with fevers or chills.  Patient states usually she needs Dilaudid and Phenergan for her pain and nausea.  Her sugars have been fine in the 200s   Patient has a history of recurrent episodes of gastroparesis and abdominal pain associated with her diabetes.  She also has history of DKA.  Patient's had 15 ED visits in the last 6 months to the [facility].  According to the medical records the patient was also admitted to [facility] on January 21 and was discharged 2 days ago on January 26.  That hospitalization was also complicated by urinary retention.  Past Medical History:  Diagnosis Date  . Anxiety   . Arthritis    "hands, feet, knees" (12/18/2016)  . Asthma   . Diabetic gastroparesis ([place])    Per gastric emptying study 07/09/16 which showed significant delayed gastric emptying.  . Gallstones   . Gastroparesis   . GERD (gastroesophageal reflux disease)   . Heart murmur   . Hepatic steatosis 11/26/2014   and hepatomegaly  . Hypertension    hx (12/18/2016)  . Intractable cyclical vomiting syndrome    Archie Endo 12/18/2016  . Liver mass 11/26/2014  . Pancreatitis, acute 11/26/2014  . Pneumonia    "as a teen X 1" (12/18/2016)  . Type I diabetes mellitus ([place]) 2007   IDDM.  poorly controlled, multiple admits with DKA    Patient Active Problem List   Diagnosis Date Noted  . Gastroparesis due  to secondary diabetes ([place]) 11/21/2018  . Diabetes mellitus ([place]) 11/13/2018  . HTN (hypertension) 11/13/2018  . Marijuana abuse 11/13/2018  . Acute pyelonephritis 10/07/2018  . Hydronephrosis of right kidney 10/05/2018  . Diffuse pain   . Elevated blood pressure reading 12/03/2017  . Tachycardia 11/30/2017  . Hypernatremia 11/02/2017  . Nausea and vomiting 01/22/2017  . Nausea & vomiting 01/21/2017  . Intractable cyclical vomiting syndrome 12/18/2016  . Leukocytosis 10/24/2016  . N&V (nausea and vomiting) 10/23/2016  . DKA, type 1 ([place]) 09/26/2016  . Thrombocytosis ([place]) 07/08/2016  . Candiduria, asymptomatic 06/23/2016  . Hyponatremia 06/13/2016  . Hematuria 05/13/2016  . UTI (urinary tract infection) 01/22/2016  . Diarrhea 11/09/2015  . Acute urinary retention   . Gastroparesis due to DM ([place]) 07/10/2015  . GERD (gastroesophageal reflux disease) 07/10/2015  . Depression with anxiety 07/10/2015  . Gastroparesis 06/22/2015  . Altered mental status 06/22/2015  . Volume depletion 06/10/2015  . Protein-calorie malnutrition, severe 06/10/2015  . Hyperglycemia   . Elevated bilirubin   . Hematemesis with nausea   . Intractable nausea and vomiting 05/28/2015  . Abdominal pain in female   . Abdominal pain 05/24/2015  . Hypertension 05/24/2015  . Dehydration   . Diabetic gastroparesis ([place])   . Chronic diastolic heart failure ([place]) 04/11/2015  . Hematemesis 04/08/2015  . DKA (diabetic ketoacidoses) ([place]) 03/22/2015  . S/P laparoscopic cholecystectomy  02/11/2015  . Postextubation stridor   . Pancreatitis, acute 11/26/2014  . Volume overload 11/26/2014  . Hypokalemia 11/26/2014  . Hepatic steatosis 11/26/2014  . Liver mass 11/26/2014  . Sepsis ([place]) 11/25/2014  . Sinus tachycardia 11/25/2014  . Hypomagnesemia 11/25/2014  . Hypophosphatemia 11/25/2014  . Elevated LFTs 11/24/2014  . AKI (acute kidney injury) ([place]) 11/24/2014  . Migraine headache 11/24/2014  . Asthma  06/29/2012  . Uncontrolled type 1 diabetes mellitus ([place]) 06/19/2010  . Goiter, unspecified 06/19/2010    Past Surgical History:  Procedure Laterality Date  . CHOLECYSTECTOMY N/A 02/11/2015   Procedure: LAPAROSCOPIC CHOLECYSTECTOMY WITH INTRAOPERATIVE CHOLANGIOGRAM;  Surgeon: Greer Pickerel, MD;  Location: [facility] ORS;  Service: General;  Laterality: N/A;  . ESOPHAGOGASTRODUODENOSCOPY (EGD) WITH PROPOFOL Left 09/20/2014   Procedure: ESOPHAGOGASTRODUODENOSCOPY (EGD) WITH PROPOFOL;  Surgeon: Arta Silence, MD;  Location: [facility] ENDOSCOPY;  Service: Endoscopy;  Laterality: Left;  . WISDOM TOOTH EXTRACTION       OB History    Gravida  2   Para  1   Term  0   Preterm  1   AB  1   Living  1     SAB  0   TAB  1   Ectopic  0   Multiple  0   Live Births  1           Family History  Problem Relation Age of Onset  . Heart disease Maternal Grandmother   . Heart disease Maternal Grandfather   . Diabetes Mother   . Hyperlipidemia Mother   . Hypertension Father   . Heart disease Father   . Hypertension Paternal Grandmother   . Cancer Paternal Grandfather     Social History   Tobacco Use  . Smoking status: Never Smoker  . Smokeless tobacco: Never Used  Substance Use Topics  . Alcohol use: No  . Drug use: Yes    Types: Marijuana    Home Medications Prior to Admission medications   Medication Sig Start Date End Date Taking? Authorizing Provider  albuterol (PROVENTIL HFA;VENTOLIN HFA) 108 (90 Base) MCG/ACT inhaler Inhale 1-2 puffs into the lungs every 6 (six) hours as needed for wheezing or shortness of breath.    [provider]  blood glucose meter kit and supplies Dispense based on patient and insurance preference. Use up to four times daily as directed. (FOR ICD-10 E10.9, E11.9). 12/07/17   Eugenie Filler, MD  dicyclomine (BENTYL) 20 MG tablet Take 1 tablet (20 mg total) by mouth every 8 (eight) hours as needed for spasms. 12/31/18   Petrucelli, Samantha R, PA-C   glucagon (GLUCAGON EMERGENCY) 1 MG injection Inject 1 mg into the muscle once as needed (For very low blood sugars. May repeat in 15-20 minutes if needed.). 12/20/16   Hongalgi, Lenis Dickinson, MD  insulin aspart (NOVOLOG) 100 UNIT/ML injection Inject 0-9 Units into the skin 3 (three) times daily with meals. 01/20/19   Shelly Coss, MD  insulin glargine (LANTUS) 100 UNIT/ML injection Inject 0.3 mLs (30 Units total) into the skin daily. 01/20/19   Shelly Coss, MD  Insulin Pen Needle 31G X 5 MM MISC 30 Units by Does not apply route at bedtime. 12/07/17   Eugenie Filler, MD  lisinopril (ZESTRIL) 2.5 MG tablet Take 2.5 mg by mouth daily.    [provider]  metoCLOPramide (REGLAN) 10 MG tablet Take 1 tablet (10 mg total) by mouth 3 (three) times daily with meals. 11/18/18 11/18/19  Kathie Dike, MD  naproxen (NAPROSYN) 375 MG tablet Take 1 tablet (375 mg total) by mouth 2 (two) times daily as needed. Patient taking differently: Take 375 mg by mouth 2 (two) times daily as needed for moderate pain.  12/12/18   Albrizze, Kaitlyn E, PA-C  ondansetron (ZOFRAN ODT) 4 MG disintegrating tablet Take 1 tablet (4 mg total) by mouth every 8 (eight) hours as needed for nausea or vomiting. 11/18/18   Kathie Dike, MD  promethazine (PHENERGAN) 25 MG suppository Place 1 suppository (25 mg total) rectally every 6 (six) hours as needed for nausea or vomiting. 01/22/19   Montine Circle, PA-C  traZODone (DESYREL) 50 MG tablet Take 25-50 mg by mouth at bedtime as needed for sleep.  11/30/18   [provider]  trimethoprim (TRIMPEX) 100 MG tablet Take 100 mg by mouth daily. 11/11/18   [provider]    Allergies    Peanut-containing drug products, Food, and Ultram [tramadol]  Review of Systems   Review of Systems  All other systems reviewed and are negative.   Physical Exam Updated Vital Signs BP (!) 145/98   Pulse (!) 116   Temp 98.3 F (36.8 C) (Oral)   Resp (!) 22   LMP  01/18/2019   SpO2 93%   Physical Exam Vitals and nursing note reviewed.  Constitutional:      Appearance: She is ill-appearing.  HENT:     Head: Normocephalic and atraumatic.     Right Ear: External ear normal.     Left Ear: External ear normal.  Eyes:     General: No scleral icterus.       Right eye: No discharge.        Left eye: No discharge.     Conjunctiva/sclera: Conjunctivae normal.  Neck:     Trachea: No tracheal deviation.  Cardiovascular:     Rate and Rhythm: Normal rate and regular rhythm.  Pulmonary:     Effort: Pulmonary effort is normal. No respiratory distress.     Breath sounds: Normal breath sounds. No stridor. No wheezing or rales.  Abdominal:     General: Bowel sounds are normal. There is no distension.     Palpations: Abdomen is soft.     Tenderness: There is generalized abdominal tenderness. There is no guarding or rebound.     Comments: Abdomen soft, not distended, diffusely tender  Musculoskeletal:        General: No tenderness.     Cervical back: Neck supple.  Skin:    General: Skin is warm and dry.     Findings: No rash.  Neurological:     Mental Status: She is alert.     Cranial Nerves: No cranial nerve deficit (no facial droop, extraocular movements intact, no slurred speech).     Sensory: No sensory deficit.     Motor: No abnormal muscle tone or seizure activity.     Coordination: Coordination normal.     ED Results / Procedures / Treatments   Labs (all labs ordered are listed, but only abnormal results are displayed) Labs Reviewed  COMPREHENSIVE METABOLIC PANEL - Abnormal; Notable for the following components:      Result Value   Glucose, Bld 151 (*)    All other components within normal limits  CBC WITH DIFFERENTIAL/PLATELET - Abnormal; Notable for the following components:   RBC 3.72 (*)    Hemoglobin 9.6 (*)    HCT 31.4 (*)    MCH 25.8 (*)    RDW 15.6 (*)  Platelets 473 (*)    All other components within normal limits  BLOOD  GAS, VENOUS - Abnormal; Notable for the following components:   Bicarbonate 29.9 (*)    Acid-Base Excess 4.6 (*)    All other components within normal limits  LIPASE, BLOOD  URINALYSIS, ROUTINE W REFLEX MICROSCOPIC  I-STAT BETA HCG BLOOD, ED ([facility], [facility], [facility] ONLY)    EKG EKG Interpretation  Date/Time:  Thursday February 16 2019 13:45:41 EST Ventricular Rate:  124 PR Interval:    QRS Duration: 83 QT Interval:  304 QTC Calculation: 437 R Axis:   79 Text Interpretation: Sinus tachycardia Consider right atrial enlargement Borderline T abnormalities, inferior leads No significant change since last tracing Confirmed by Dorie Rank [phone]) on 02/16/2019 2:17:16 PM   Radiology DG Abd Acute W/Chest  Result Date: 02/16/2019 CLINICAL DATA:  Abdominal pain. EXAM: DG ABDOMEN ACUTE W/ 1V CHEST COMPARISON:  CT 12/28/2018.  Abdomen series 12/21/2018. FINDINGS: PowerPort catheter noted with tip over superior vena cava. No acute cardiopulmonary disease identified. Surgical clips right upper quadrant. Stool noted throughout the colon. No bowel distention or free air. Pelvic calcifications consistent phleboliths again noted. No acute bony abnormality identified. IMPRESSION: 1. PowerPort catheter with tip over superior vena cava. No acute cardiopulmonary disease. 2. No acute intra-abdominal abnormality identified. Stool noted throughout the colon. Electronically Signed   By: Marcello Moores  Register   On: 02/16/2019 14:40    Procedures Procedures (including critical care time)  Medications Ordered in ED Medications  sodium chloride 0.9 % bolus 1,000 mL (0 mLs Intravenous Stopped 02/16/19 1517)    And  0.9 %  sodium chloride infusion ( Intravenous New Bag/Given (Non-Interop) 02/16/19 1517)  promethazine (PHENERGAN) injection 12.5 mg (has no administration in time range)  HYDROmorphone (DILAUDID) injection 1 mg (has no administration in time range)  promethazine (PHENERGAN) injection 25 mg (25 mg Intravenous Given  02/16/19 1339)  diphenhydrAMINE (BENADRYL) injection 25 mg (25 mg Intravenous Given 02/16/19 1339)  haloperidol lactate (HALDOL) injection 2 mg (2 mg Intravenous Given 02/16/19 1409)  HYDROmorphone (DILAUDID) injection 1 mg (1 mg Intravenous Given 02/16/19 1625)  droperidol (INAPSINE) 2.5 MG/ML injection 1.25 mg (1.25 mg Intravenous Given 02/16/19 1625)    ED Course  I have reviewed the triage vital signs and the nursing notes.  Pertinent labs & imaging results that were available during my care of the patient were reviewed by me and considered in my medical decision making (see chart for details).  Clinical Course as of Feb 16 1656  Thu Feb 16, 2019  1417 Hemoglobin is stable compared to recent.  Venous pH without acidosis   [JK]  1552 Patient still uncomfortable.  No relief with initial pain treatment.   [IO]  0355 Labs reviewed.  No signs of DKA or other significant abnormalities   [JK]  1656 Patient with slight improvement in the although still appears uncomfortable.  Will give additional dose of meds.  No vomiting noted   [JK]    Clinical Course User Index [JK] Dorie Rank, MD   MDM Rules/Calculators/A&P                      Patient presented to ED for evaluation of recurrent abdominal pain and vomiting.  Patient was notably uncomfortable in the ED but no active emesis observed by me.  Patient's laboratory test fortunately did not show signs of recurrent diabetic ketoacidosis.  Labs are unremarkable.  X-rays do not show evidence of obstruction.  Patient  required several doses of antiemetics and pain medications ultimately requiring opiates.  She continued to have discomfort but did show signs of improvement.  I think it is reasonable for try to continue to manage her symptoms at home.  Warning signs and precautions discussed Final Clinical Impression(s) / ED Diagnoses Final diagnoses:  Chronic abdominal pain  Gastroparesis    Rx / DC Orders ED Discharge Orders    None         Dorie Rank, MD 02/16/19 1700

## 2019-02-16 NOTE — Discharge Instructions (Addendum)
Continue your medications that you have at home, follow-up with your doctor if your symptoms are not improving, return as needed for worsening symptoms

## 2019-02-16 NOTE — ED Triage Notes (Signed)
Patient reports abdominal pain with N/V since this morning. Hx gastroparesis. States she had port placed in the beginning of January.

## 2019-02-17 ENCOUNTER — Encounter (HOSPITAL_COMMUNITY): Payer: Self-pay

## 2019-02-17 ENCOUNTER — Other Ambulatory Visit: Payer: Self-pay

## 2019-02-17 ENCOUNTER — Emergency Department (HOSPITAL_COMMUNITY)
Admission: EM | Admit: 2019-02-17 | Discharge: 2019-02-18 | Disposition: A | Discharge status: Home / Self Care | Payer: BC Managed Care – PPO | Attending: Emergency Medicine | Admitting: Emergency Medicine

## 2019-02-17 DIAGNOSIS — Z79899 Other long term (current) drug therapy: Secondary | ICD-10-CM | POA: Insufficient documentation

## 2019-02-17 DIAGNOSIS — K3184 Gastroparesis: Secondary | ICD-10-CM | POA: Insufficient documentation

## 2019-02-17 DIAGNOSIS — Z91010 Allergy to peanuts: Secondary | ICD-10-CM | POA: Diagnosis not present

## 2019-02-17 DIAGNOSIS — R112 Nausea with vomiting, unspecified: Secondary | ICD-10-CM | POA: Diagnosis not present

## 2019-02-17 DIAGNOSIS — J45909 Unspecified asthma, uncomplicated: Secondary | ICD-10-CM | POA: Diagnosis not present

## 2019-02-17 DIAGNOSIS — E1065 Type 1 diabetes mellitus with hyperglycemia: Secondary | ICD-10-CM | POA: Insufficient documentation

## 2019-02-17 DIAGNOSIS — I10 Essential (primary) hypertension: Secondary | ICD-10-CM | POA: Insufficient documentation

## 2019-02-17 DIAGNOSIS — R109 Unspecified abdominal pain: Secondary | ICD-10-CM | POA: Insufficient documentation

## 2019-02-17 LAB — COMPREHENSIVE METABOLIC PANEL
ALT: 10 U/L (ref 0–44)
AST: 14 U/L — ABNORMAL LOW (ref 15–41)
Albumin: 4.3 g/dL (ref 3.5–5.0)
Alkaline Phosphatase: 72 U/L (ref 38–126)
Anion gap: 8 (ref 5–15)
BUN: 18 mg/dL (ref 6–20)
CO2: 28 mmol/L (ref 22–32)
Calcium: 9.5 mg/dL (ref 8.9–10.3)
Chloride: 103 mmol/L (ref 98–111)
Creatinine, Ser: 0.68 mg/dL (ref 0.44–1.00)
GFR calc Af Amer: >60 mL/min (ref >60)
GFR calc non Af Amer: >60 mL/min (ref >60)
Glucose, Bld: 84 mg/dL (ref 70–99)
Potassium: 3.8 mmol/L (ref 3.5–5.1)
Sodium: 139 mmol/L (ref 135–145)
Total Bilirubin: 0.8 mg/dL (ref 0.3–1.2)
Total Protein: 7.4 g/dL (ref 6.5–8.1)

## 2019-02-17 LAB — URINALYSIS, ROUTINE W REFLEX MICROSCOPIC
Bilirubin Urine: NEGATIVE
Glucose, UA: 150 mg/dL — AB
Ketones, ur: 20 mg/dL — AB
Nitrite: NEGATIVE
Protein, ur: 100 mg/dL — AB
RBC / HPF: >50 RBC/hpf — ABNORMAL HIGH (ref 0–5)
Specific Gravity, Urine: 1.017 (ref 1.005–1.030)
WBC, UA: >50 WBC/hpf — ABNORMAL HIGH (ref 0–5)
pH: 7 (ref 5.0–8.0)

## 2019-02-17 LAB — CBC
HCT: 32 % — ABNORMAL LOW (ref 36.0–46.0)
Hemoglobin: 9.7 g/dL — ABNORMAL LOW (ref 12.0–15.0)
MCH: 25.3 pg — ABNORMAL LOW (ref 26.0–34.0)
MCHC: 30.3 g/dL (ref 30.0–36.0)
MCV: 83.3 fL (ref 80.0–100.0)
Platelets: 489 10*3/uL — ABNORMAL HIGH (ref 150–400)
RBC: 3.84 MIL/uL — ABNORMAL LOW (ref 3.87–5.11)
RDW: 15.4 % (ref 11.5–15.5)
WBC: 8.4 10*3/uL (ref 4.0–10.5)
nRBC: 0 % (ref 0.0–0.2)

## 2019-02-17 LAB — LIPASE, BLOOD: Lipase: 14 U/L (ref 11–51)

## 2019-02-17 LAB — CBG MONITORING, ED
Glucose-Capillary: 160 mg/dL — ABNORMAL HIGH (ref 70–99)
Glucose-Capillary: 180 mg/dL — ABNORMAL HIGH (ref 70–99)
Glucose-Capillary: 31 mg/dL — CL (ref 70–99)
Glucose-Capillary: 97 mg/dL (ref 70–99)

## 2019-02-17 LAB — BLOOD GAS, VENOUS
Acid-Base Excess: 2.8 mmol/L — ABNORMAL HIGH (ref 0.0–2.0)
Bicarbonate: 27.8 mmol/L (ref 20.0–28.0)
O2 Saturation: 87 %
Patient temperature: 98.6
pCO2, Ven: 46.6 mmHg (ref 44.0–60.0)
pH, Ven: 7.392 (ref 7.250–7.430)
pO2, Ven: 56.7 mmHg — ABNORMAL HIGH (ref 32.0–45.0)

## 2019-02-17 LAB — I-STAT BETA HCG BLOOD, ED (MC, WL, AP ONLY): I-stat hCG, quantitative: <5 m[IU]/mL (ref <5)

## 2019-02-17 MED ORDER — DEXTROSE 50 % IV SOLN
INTRAVENOUS | Status: AC
  Administered 2019-02-17: 50 mL
  Filled 2019-02-17: qty 50

## 2019-02-17 MED ORDER — SODIUM CHLORIDE 0.9% FLUSH
3.0000 mL | Freq: Once | INTRAVENOUS | Status: AC
  Administered 2019-02-17: 3 mL via INTRAVENOUS

## 2019-02-17 MED ORDER — FENTANYL CITRATE (PF) 100 MCG/2ML IJ SOLN
50.0000 ug | Freq: Once | INTRAMUSCULAR | Status: AC
  Administered 2019-02-17: 23:00:00 50 ug via INTRAVENOUS
  Filled 2019-02-17: qty 2

## 2019-02-17 MED ORDER — PROMETHAZINE HCL 25 MG/ML IJ SOLN
12.5000 mg | Freq: Once | INTRAMUSCULAR | Status: AC
  Administered 2019-02-17: 12.5 mg via INTRAVENOUS
  Filled 2019-02-17: qty 1

## 2019-02-17 MED ORDER — SODIUM CHLORIDE 0.9 % IV BOLUS
1000.0000 mL | Freq: Once | INTRAVENOUS | Status: AC
  Administered 2019-02-17: 1000 mL via INTRAVENOUS

## 2019-02-17 MED ORDER — DROPERIDOL 2.5 MG/ML IJ SOLN
1.2500 mg | Freq: Once | INTRAMUSCULAR | Status: AC
  Administered 2019-02-17: 1.25 mg via INTRAVENOUS
  Filled 2019-02-17: qty 2

## 2019-02-17 MED ORDER — HYDROMORPHONE HCL 1 MG/ML IJ SOLN
1.0000 mg | Freq: Once | INTRAMUSCULAR | Status: AC
  Administered 2019-02-17: 1 mg via INTRAVENOUS
  Filled 2019-02-17: qty 1

## 2019-02-17 NOTE — Discharge Instructions (Addendum)
Please follow-up with your primary doctor regarding the symptoms you are experiencing today.  Recommend taking your previously prescribed antinausea medicines as needed.  Return to ER for fever, worsening abdominal pain, vomiting.

## 2019-02-17 NOTE — ED Triage Notes (Signed)
Patient c/o abdominal pain and vomiting since this AM. Patient states her CBGs at home have been in the 200's.  CBG in triage-160.

## 2019-02-17 NOTE — ED Notes (Signed)
Patient requesting lab draw from port. 

## 2019-02-18 ENCOUNTER — Emergency Department (HOSPITAL_COMMUNITY): Payer: BC Managed Care – PPO

## 2019-02-18 ENCOUNTER — Other Ambulatory Visit: Payer: Self-pay

## 2019-02-18 ENCOUNTER — Inpatient Hospital Stay (HOSPITAL_COMMUNITY)
Admission: EM | Admit: 2019-02-18 | Discharge: 2019-02-23 | DRG: 073 | Disposition: A | Discharge status: Home / Self Care | Payer: BC Managed Care – PPO | Attending: Family Medicine | Admitting: Family Medicine

## 2019-02-18 ENCOUNTER — Encounter (HOSPITAL_COMMUNITY): Payer: Self-pay | Admitting: Emergency Medicine

## 2019-02-18 DIAGNOSIS — E10649 Type 1 diabetes mellitus with hypoglycemia without coma: Secondary | ICD-10-CM | POA: Diagnosis not present

## 2019-02-18 DIAGNOSIS — E1043 Type 1 diabetes mellitus with diabetic autonomic (poly)neuropathy: Principal | ICD-10-CM | POA: Diagnosis present

## 2019-02-18 DIAGNOSIS — Z9641 Presence of insulin pump (external) (internal): Secondary | ICD-10-CM | POA: Diagnosis present

## 2019-02-18 DIAGNOSIS — Z833 Family history of diabetes mellitus: Secondary | ICD-10-CM

## 2019-02-18 DIAGNOSIS — E1065 Type 1 diabetes mellitus with hyperglycemia: Secondary | ICD-10-CM | POA: Diagnosis present

## 2019-02-18 DIAGNOSIS — M19042 Primary osteoarthritis, left hand: Secondary | ICD-10-CM | POA: Diagnosis present

## 2019-02-18 DIAGNOSIS — Z8349 Family history of other endocrine, nutritional and metabolic diseases: Secondary | ICD-10-CM

## 2019-02-18 DIAGNOSIS — K5900 Constipation, unspecified: Secondary | ICD-10-CM | POA: Diagnosis present

## 2019-02-18 DIAGNOSIS — D649 Anemia, unspecified: Secondary | ICD-10-CM | POA: Diagnosis present

## 2019-02-18 DIAGNOSIS — Z91010 Allergy to peanuts: Secondary | ICD-10-CM

## 2019-02-18 DIAGNOSIS — K859 Acute pancreatitis without necrosis or infection, unspecified: Secondary | ICD-10-CM | POA: Diagnosis present

## 2019-02-18 DIAGNOSIS — K2100 Gastro-esophageal reflux disease with esophagitis, without bleeding: Secondary | ICD-10-CM | POA: Diagnosis present

## 2019-02-18 DIAGNOSIS — I10 Essential (primary) hypertension: Secondary | ICD-10-CM | POA: Diagnosis present

## 2019-02-18 DIAGNOSIS — I5032 Chronic diastolic (congestive) heart failure: Secondary | ICD-10-CM | POA: Diagnosis present

## 2019-02-18 DIAGNOSIS — M170 Bilateral primary osteoarthritis of knee: Secondary | ICD-10-CM | POA: Diagnosis present

## 2019-02-18 DIAGNOSIS — N1330 Unspecified hydronephrosis: Secondary | ICD-10-CM | POA: Diagnosis present

## 2019-02-18 DIAGNOSIS — N319 Neuromuscular dysfunction of bladder, unspecified: Secondary | ICD-10-CM | POA: Diagnosis present

## 2019-02-18 DIAGNOSIS — R112 Nausea with vomiting, unspecified: Secondary | ICD-10-CM | POA: Diagnosis not present

## 2019-02-18 DIAGNOSIS — R109 Unspecified abdominal pain: Secondary | ICD-10-CM

## 2019-02-18 DIAGNOSIS — J45909 Unspecified asthma, uncomplicated: Secondary | ICD-10-CM | POA: Diagnosis present

## 2019-02-18 DIAGNOSIS — I110 Hypertensive heart disease with heart failure: Secondary | ICD-10-CM | POA: Diagnosis present

## 2019-02-18 DIAGNOSIS — Z888 Allergy status to other drugs, medicaments and biological substances status: Secondary | ICD-10-CM

## 2019-02-18 DIAGNOSIS — F419 Anxiety disorder, unspecified: Secondary | ICD-10-CM | POA: Diagnosis present

## 2019-02-18 DIAGNOSIS — K3184 Gastroparesis: Secondary | ICD-10-CM | POA: Diagnosis present

## 2019-02-18 DIAGNOSIS — Z794 Long term (current) use of insulin: Secondary | ICD-10-CM

## 2019-02-18 DIAGNOSIS — Z91018 Allergy to other foods: Secondary | ICD-10-CM

## 2019-02-18 DIAGNOSIS — Z20822 Contact with and (suspected) exposure to covid-19: Secondary | ICD-10-CM | POA: Diagnosis present

## 2019-02-18 DIAGNOSIS — Z8249 Family history of ischemic heart disease and other diseases of the circulatory system: Secondary | ICD-10-CM

## 2019-02-18 DIAGNOSIS — M19041 Primary osteoarthritis, right hand: Secondary | ICD-10-CM | POA: Diagnosis present

## 2019-02-18 DIAGNOSIS — Z79899 Other long term (current) drug therapy: Secondary | ICD-10-CM

## 2019-02-18 DIAGNOSIS — IMO0002 Reserved for concepts with insufficient information to code with codable children: Secondary | ICD-10-CM | POA: Diagnosis present

## 2019-02-18 DIAGNOSIS — R1084 Generalized abdominal pain: Secondary | ICD-10-CM | POA: Diagnosis present

## 2019-02-18 LAB — CBC WITH DIFFERENTIAL/PLATELET
Abs Immature Granulocytes: 0.02 10*3/uL (ref 0.00–0.07)
Basophils Absolute: 0 10*3/uL (ref 0.0–0.1)
Basophils Relative: 0 %
Eosinophils Absolute: 0.1 10*3/uL (ref 0.0–0.5)
Eosinophils Relative: 1 %
HCT: 31.4 % — ABNORMAL LOW (ref 36.0–46.0)
Hemoglobin: 9.7 g/dL — ABNORMAL LOW (ref 12.0–15.0)
Immature Granulocytes: 0 %
Lymphocytes Relative: 18 %
Lymphs Abs: 1.4 10*3/uL (ref 0.7–4.0)
MCH: 25.7 pg — ABNORMAL LOW (ref 26.0–34.0)
MCHC: 30.9 g/dL (ref 30.0–36.0)
MCV: 83.1 fL (ref 80.0–100.0)
Monocytes Absolute: 0.3 10*3/uL (ref 0.1–1.0)
Monocytes Relative: 4 %
Neutro Abs: 6.3 10*3/uL (ref 1.7–7.7)
Neutrophils Relative %: 77 %
Platelets: 464 10*3/uL — ABNORMAL HIGH (ref 150–400)
RBC: 3.78 MIL/uL — ABNORMAL LOW (ref 3.87–5.11)
RDW: 15.5 % (ref 11.5–15.5)
WBC: 8.1 10*3/uL (ref 4.0–10.5)
nRBC: 0 % (ref 0.0–0.2)

## 2019-02-18 LAB — URINALYSIS, ROUTINE W REFLEX MICROSCOPIC
Bilirubin Urine: NEGATIVE
Glucose, UA: >=500 mg/dL — AB
Ketones, ur: 20 mg/dL — AB
Nitrite: NEGATIVE
Protein, ur: 30 mg/dL — AB
RBC / HPF: >50 RBC/hpf — ABNORMAL HIGH (ref 0–5)
Specific Gravity, Urine: 1.014 (ref 1.005–1.030)
WBC, UA: >50 WBC/hpf — ABNORMAL HIGH (ref 0–5)
pH: 7 (ref 5.0–8.0)

## 2019-02-18 LAB — RAPID URINE DRUG SCREEN, HOSP PERFORMED
Amphetamines: NOT DETECTED
Barbiturates: NOT DETECTED
Benzodiazepines: NOT DETECTED
Cocaine: NOT DETECTED
Opiates: POSITIVE — AB
Tetrahydrocannabinol: NOT DETECTED

## 2019-02-18 LAB — COMPREHENSIVE METABOLIC PANEL
ALT: 10 U/L (ref 0–44)
AST: 15 U/L (ref 15–41)
Albumin: 3.8 g/dL (ref 3.5–5.0)
Alkaline Phosphatase: 67 U/L (ref 38–126)
Anion gap: 11 (ref 5–15)
BUN: 17 mg/dL (ref 6–20)
CO2: 26 mmol/L (ref 22–32)
Calcium: 9.1 mg/dL (ref 8.9–10.3)
Chloride: 99 mmol/L (ref 98–111)
Creatinine, Ser: 0.75 mg/dL (ref 0.44–1.00)
GFR calc Af Amer: >60 mL/min (ref >60)
GFR calc non Af Amer: >60 mL/min (ref >60)
Glucose, Bld: 306 mg/dL — ABNORMAL HIGH (ref 70–99)
Potassium: 3.8 mmol/L (ref 3.5–5.1)
Sodium: 136 mmol/L (ref 135–145)
Total Bilirubin: 0.7 mg/dL (ref 0.3–1.2)
Total Protein: 6.8 g/dL (ref 6.5–8.1)

## 2019-02-18 LAB — SARS CORONAVIRUS 2 (TAT 6-24 HRS): SARS Coronavirus 2: NEGATIVE

## 2019-02-18 LAB — I-STAT BETA HCG BLOOD, ED (MC, WL, AP ONLY): I-stat hCG, quantitative: <5 m[IU]/mL (ref <5)

## 2019-02-18 LAB — LIPASE, BLOOD: Lipase: 13 U/L (ref 11–51)

## 2019-02-18 MED ORDER — MORPHINE SULFATE (PF) 2 MG/ML IV SOLN
2.0000 mg | INTRAVENOUS | Status: DC | PRN
  Administered 2019-02-18 – 2019-02-20 (×9): 2 mg via INTRAVENOUS
  Filled 2019-02-18 (×9): qty 1

## 2019-02-18 MED ORDER — INSULIN GLARGINE 100 UNIT/ML ~~LOC~~ SOLN
30.0000 [IU] | Freq: Every day | SUBCUTANEOUS | Status: DC
  Filled 2019-02-18 (×2): qty 0.3

## 2019-02-18 MED ORDER — SODIUM CHLORIDE 0.9 % IV BOLUS
1000.0000 mL | Freq: Once | INTRAVENOUS | Status: AC
  Administered 2019-02-18: 13:00:00 1000 mL via INTRAVENOUS

## 2019-02-18 MED ORDER — SODIUM CHLORIDE (PF) 0.9 % IJ SOLN
INTRAMUSCULAR | Status: AC
  Filled 2019-02-18: qty 50

## 2019-02-18 MED ORDER — FERROUS SULFATE 325 (65 FE) MG PO TABS: 325.0000 mg | ORAL_TABLET | ORAL | Status: DC

## 2019-02-18 MED ORDER — HEPARIN SOD (PORK) LOCK FLUSH 100 UNIT/ML IV SOLN
INTRAVENOUS | Status: AC
  Administered 2019-02-18: 500 [IU]
  Filled 2019-02-18: qty 5

## 2019-02-18 MED ORDER — PANTOPRAZOLE SODIUM 40 MG PO TBEC
40.0000 mg | DELAYED_RELEASE_TABLET | Freq: Every day | ORAL | Status: DC
  Administered 2019-02-19 – 2019-02-23 (×5): 40 mg via ORAL
  Filled 2019-02-18 (×5): qty 1

## 2019-02-18 MED ORDER — TRAZODONE HCL 50 MG PO TABS
50.0000 mg | ORAL_TABLET | Freq: Every day | ORAL | Status: DC
  Administered 2019-02-19 – 2019-02-22 (×5): 50 mg via ORAL
  Filled 2019-02-18 (×5): qty 1

## 2019-02-18 MED ORDER — INSULIN ASPART 100 UNIT/ML ~~LOC~~ SOLN: 0.0000 [IU] | Freq: Three times a day (TID) | SUBCUTANEOUS | Status: DC

## 2019-02-18 MED ORDER — HYDROMORPHONE HCL 1 MG/ML IJ SOLN
1.0000 mg | Freq: Once | INTRAMUSCULAR | Status: AC
  Administered 2019-02-18: 14:00:00 1 mg via INTRAVENOUS
  Filled 2019-02-18: qty 1

## 2019-02-18 MED ORDER — ALBUTEROL SULFATE HFA 108 (90 BASE) MCG/ACT IN AERS: 1.0000 | INHALATION_SPRAY | Freq: Four times a day (QID) | RESPIRATORY_TRACT | Status: DC | PRN

## 2019-02-18 MED ORDER — HALOPERIDOL LACTATE 5 MG/ML IJ SOLN
5.0000 mg | Freq: Once | INTRAMUSCULAR | Status: AC
  Administered 2019-02-18: 13:00:00 5 mg via INTRAVENOUS
  Filled 2019-02-18: qty 1

## 2019-02-18 MED ORDER — ENOXAPARIN SODIUM 40 MG/0.4ML ~~LOC~~ SOLN
40.0000 mg | SUBCUTANEOUS | Status: DC
  Administered 2019-02-18: 22:00:00 40 mg via SUBCUTANEOUS
  Filled 2019-02-18: qty 0.4

## 2019-02-18 MED ORDER — PROMETHAZINE HCL 25 MG/ML IJ SOLN
12.5000 mg | Freq: Four times a day (QID) | INTRAMUSCULAR | Status: DC | PRN
  Administered 2019-02-19 – 2019-02-22 (×8): 12.5 mg via INTRAVENOUS
  Filled 2019-02-18 (×8): qty 1

## 2019-02-18 MED ORDER — IOHEXOL 300 MG/ML  SOLN
100.0000 mL | Freq: Once | INTRAMUSCULAR | Status: AC | PRN
  Administered 2019-02-18: 14:00:00 100 mL via INTRAVENOUS

## 2019-02-18 MED ORDER — LISINOPRIL 2.5 MG PO TABS
2.5000 mg | ORAL_TABLET | Freq: Every day | ORAL | Status: DC
  Administered 2019-02-19 – 2019-02-23 (×5): 2.5 mg via ORAL
  Filled 2019-02-18 (×6): qty 1

## 2019-02-18 MED ORDER — SODIUM CHLORIDE 0.9 % IV SOLN: Freq: Once | INTRAVENOUS | Status: AC

## 2019-02-18 MED ORDER — SODIUM CHLORIDE 0.9 % IV SOLN: INTRAVENOUS | Status: DC

## 2019-02-18 MED ORDER — INSULIN ASPART 100 UNIT/ML ~~LOC~~ SOLN: 0.0000 [IU] | Freq: Every day | SUBCUTANEOUS | Status: DC

## 2019-02-18 MED ORDER — METOCLOPRAMIDE HCL 5 MG/ML IJ SOLN
5.0000 mg | Freq: Four times a day (QID) | INTRAMUSCULAR | Status: DC | PRN
  Administered 2019-02-18: 18:00:00 5 mg via INTRAVENOUS
  Filled 2019-02-18: qty 2

## 2019-02-18 MED ORDER — ONDANSETRON HCL 4 MG/2ML IJ SOLN
4.0000 mg | Freq: Once | INTRAMUSCULAR | Status: AC
  Administered 2019-02-18: 15:00:00 4 mg via INTRAVENOUS
  Filled 2019-02-18: qty 2

## 2019-02-18 MED ORDER — HYDROMORPHONE HCL 1 MG/ML IJ SOLN
1.0000 mg | Freq: Once | INTRAMUSCULAR | Status: AC
  Administered 2019-02-18: 15:00:00 1 mg via INTRAVENOUS
  Filled 2019-02-18: qty 1

## 2019-02-18 NOTE — ED Notes (Signed)
ED TO INPATIENT HANDOFF REPORT  ED Nurse Name and Phone #: [phone]  S Name/Age/Gender [name] 29 y.o. female Room/Bed: WA12/WA12  Code Status   Code Status: Full Code  Home/SNF/Other Home Patient oriented to: self, place, time and situation Is this baseline? Yes   Triage Complete: Triage complete  Chief Complaint Intractable nausea and vomiting [R11.2]  Triage Note Patient c/o N/V and abdominal pain x3 days. Seen for same x2 days. Hx gastroparesis.    Allergies Allergies  Allergen Reactions  . Peanut-Containing Drug Products Swelling and Other (See Comments)    Reaction:  Swelling of mouth and lips   . 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   Reaction:  Swelling of mouth and lips   . Ultram [Tramadol] Itching    Level of Care/Admitting Diagnosis ED Disposition    ED Disposition Condition Comment   Admit  Hospital Area: [facility] [[id number]]  Level of Care: Med-Surg [16]  Covid Evaluation: Asymptomatic Screening Protocol (No Symptoms)  Diagnosis: Intractable nausea and vomiting [id number]  Admitting Physician: Shelly Coss [id number]  Attending Physician: Shelly Coss [id number]       B Medical/Surgery History Past Medical History:  Diagnosis Date  . Anxiety   . Arthritis    "hands, feet, knees" (12/18/2016)  . Asthma   . Diabetic gastroparesis ([place])    Per gastric emptying study 07/09/16 which showed significant delayed gastric emptying.  . Gallstones   . Gastroparesis   . GERD (gastroesophageal reflux disease)   . Heart murmur   . Hepatic steatosis 11/26/2014   and hepatomegaly  . Hypertension    hx (12/18/2016)  . Intractable cyclical vomiting syndrome    Archie Endo 12/18/2016  . Liver mass 11/26/2014  . Pancreatitis, acute 11/26/2014  . Pneumonia    "as a teen X 1" (12/18/2016)  . Type I diabetes mellitus ([place]) 2007   IDDM.  poorly controlled, multiple admits with DKA   Past  Surgical History:  Procedure Laterality Date  . CHOLECYSTECTOMY N/A 02/11/2015   Procedure: LAPAROSCOPIC CHOLECYSTECTOMY WITH INTRAOPERATIVE CHOLANGIOGRAM;  Surgeon: Greer Pickerel, MD;  Location: [facility] OR[facility];  Service: General;  Laterality: N/A;  . ESOPHAGOGASTRODUODENOSCOPY (EGD) WITH PROPOFOL Left 09/20/2014   Procedure: ESOPHAGOGASTRODUODENOSCOPY (EGD) WITH PROPOFOL;  Surgeon: Arta Silence, MD;  Location: [facility] ENDOSCOPY;  Service: Endoscopy;  Laterality: Left;  . WISDOM TOOTH EXTRACTION       A IV Location/Drains/Wounds Patient Lines/Drains/Airways Status   Active Line/Drains/Airways    Name:   Placement date:   Placement time:   Site:   Days:   Implanted Port 01/20/19   01/20/19    1337    --   29          Intake/Output Last 24 hours No intake or output data in the 24 hours ending 02/18/19 1734  Labs/Imaging Results for orders placed or performed during the hospital encounter of 02/18/19 (from the past 48 hour(s))  CBC with Differential     Status: Abnormal   Collection Time: 02/18/19 12:46 PM  Result Value Ref Range   WBC 8.1 4.0 - 10.5 K/uL   RBC 3.78 (L) 3.87 - 5.11 MIL/uL   Hemoglobin 9.7 (L) 12.0 - 15.0 g/dL   HCT 31.4 (L) 36.0 - 46.0 %   MCV 83.1 80.0 - 100.0 fL   MCH 25.7 (L) 26.0 - 34.0 pg   MCHC 30.9 30.0 - 36.0 g/dL   RDW 15.5 11.5 - 15.5 %  Platelets 464 (H) 150 - 400 K/uL   nRBC 0.0 0.0 - 0.2 %   Neutrophils Relative % 77 %   Neutro Abs 6.3 1.7 - 7.7 K/uL   Lymphocytes Relative 18 %   Lymphs Abs 1.4 0.7 - 4.0 K/uL   Monocytes Relative 4 %   Monocytes Absolute 0.3 0.1 - 1.0 K/uL   Eosinophils Relative 1 %   Eosinophils Absolute 0.1 0.0 - 0.5 K/uL   Basophils Relative 0 %   Basophils Absolute 0.0 0.0 - 0.1 K/uL   Immature Granulocytes 0 %   Abs Immature Granulocytes 0.02 0.00 - 0.07 K/uL    Comment: Performed at [facility], [place] [address] [place], [place] [address]  Comprehensive metabolic panel     Status: Abnormal   Collection Time:  02/18/19 12:46 PM  Result Value Ref Range   Sodium 136 135 - 145 mmol/L   Potassium 3.8 3.5 - 5.1 mmol/L   Chloride 99 98 - 111 mmol/L   CO2 26 22 - 32 mmol/L   Glucose, Bld 306 (H) 70 - 99 mg/dL   BUN 17 6 - 20 mg/dL   Creatinine, Ser 0.75 0.44 - 1.00 mg/dL   Calcium 9.1 8.9 - 10.3 mg/dL   Total Protein 6.8 6.5 - 8.1 g/dL   Albumin 3.8 3.5 - 5.0 g/dL   AST 15 15 - 41 U/L   ALT 10 0 - 44 U/L   Alkaline Phosphatase 67 38 - 126 U/L   Total Bilirubin 0.7 0.3 - 1.2 mg/dL   GFR calc non Af Amer >60 >60 mL/min   GFR calc Af Amer >60 >60 mL/min   Anion gap 11 5 - 15    Comment: Performed at [facility], [place] [address] [place], [place] [address]  Lipase, blood     Status: None   Collection Time: 02/18/19 12:46 PM  Result Value Ref Range   Lipase 13 11 - 51 U/L    Comment: Performed at [facility], [place] [address] [place], [place] [address]  Urinalysis, Routine w reflex microscopic     Status: Abnormal   Collection Time: 02/18/19 12:46 PM  Result Value Ref Range   Color, Urine YELLOW YELLOW   APPearance HAZY (A) CLEAR   Specific Gravity, Urine 1.014 1.005 - 1.030   pH 7.0 5.0 - 8.0   Glucose, UA >=500 (A) NEGATIVE mg/dL   Hgb urine dipstick MODERATE (A) NEGATIVE   Bilirubin Urine NEGATIVE NEGATIVE   Ketones, ur 20 (A) NEGATIVE mg/dL   Protein, ur 30 (A) NEGATIVE mg/dL   Nitrite NEGATIVE NEGATIVE   Leukocytes,Ua LARGE (A) NEGATIVE   RBC / HPF >50 (H) 0 - 5 RBC/hpf   WBC, UA >50 (H) 0 - 5 WBC/hpf   Bacteria, UA RARE (A) NONE SEEN   Squamous Epithelial / LPF 6-10 0 - 5   Mucus PRESENT    Non Squamous Epithelial 0-5 (A) NONE SEEN    Comment: Performed at [facility], [place] [address], [place], [place] [address]  Rapid urine drug screen (hospital performed)     Status: Abnormal   Collection Time: 02/18/19 12:46 PM  Result Value Ref Range   Opiates POSITIVE (A) NONE DETECTED   Cocaine NONE DETECTED NONE DETECTED    Benzodiazepines NONE DETECTED NONE DETECTED   Amphetamines NONE DETECTED NONE DETECTED   Tetrahydrocannabinol NONE DETECTED NONE DETECTED   Barbiturates NONE DETECTED NONE DETECTED    Comment: (NOTE) DRUG  SCREEN FOR MEDICAL PURPOSES ONLY.  IF CONFIRMATION IS NEEDED FOR ANY PURPOSE, NOTIFY LAB WITHIN 5 DAYS. LOWEST DETECTABLE LIMITS FOR URINE DRUG SCREEN Drug Class                     Cutoff (ng/mL) Amphetamine and metabolites    1000 Barbiturate and metabolites    200 Benzodiazepine                 [id number] Tricyclics and metabolites     300 Opiates and metabolites        300 Cocaine and metabolites        300 THC                            50 Performed at [facility], [place] [address], [place], [place] [address]   I-Stat Beta hCG blood, ED ([facility], [facility], [facility] only)     Status: None   Collection Time: 02/18/19 12:50 PM  Result Value Ref Range   I-stat hCG, quantitative <5.0 <5 mIU/mL   Comment 3            Comment:   GEST. AGE      CONC.  (mIU/mL)   <=1 WEEK        5 - 50     2 WEEKS       50 - 500     3 WEEKS       100 - 10,000     4 WEEKS     1,000 - 30,000        FEMALE AND NON-PREGNANT FEMALE:     LESS THAN 5 mIU/mL    CT ABDOMEN PELVIS W CONTRAST  Result Date: 02/18/2019 CLINICAL DATA:  Severe diffuse abdominal pain, nausea, and vomiting for 3 days. EXAM: CT ABDOMEN AND PELVIS WITH CONTRAST TECHNIQUE: Multidetector CT imaging of the abdomen and pelvis was performed using the standard protocol following bolus administration of intravenous contrast. CONTRAST:  133mL OMNIPAQUE IOHEXOL 300 MG/ML  SOLN COMPARISON:  12/28/2018 FINDINGS: Lower Chest: No acute findings. Hepatobiliary: No hepatic masses identified. Prior cholecystectomy. No evidence of biliary obstruction. Pancreas:  No mass or inflammatory changes. Spleen: Within normal limits in size and appearance. Adrenals/Urinary Tract: No masses identified. No evidence of hydronephrosis. Areas of decreased enhancement in  the right kidney decreased since previous study, consistent with resolving pyelonephritis. Previously seen right hydroureteronephrosis has resolved. Diffuse bladder wall thickening and mucosal enhancement show mild improvement, consistent with improving cystitis. Stomach/Bowel: Persistent wall thickening distal thoracic esophagus, consistent with esophagitis. No evidence of hiatal hernia. No evidence of bowel obstruction or wall thickening, or abnormal fluid collections. Vascular/Lymphatic: No pathologically enlarged lymph nodes. No abdominal aortic aneurysm. Reproductive: 1.7 cm posterior uterine fibroid noted. Adnexal regions are unremarkable. Other:  None. Musculoskeletal:  No suspicious bone lesions identified. IMPRESSION: 1. No acute findings. 2. Improving right pyelonephritis and resolution of right hydroureteronephrosis since previous study. 3. Interval decrease in diffuse bladder wall thickening, consistent with improving cystitis. 4. Stable wall thickening of distal thoracic esophagus, consistent with esophagitis. 5. 1.7 cm posterior uterine fibroid. Electronically Signed   By: Marlaine Hind M.D.   On: 02/18/2019 14:17    Pending Labs Unresulted Labs (From admission, onward)    Start     Ordered   02/19/19 [id number]  Basic metabolic panel  Tomorrow morning,   R     02/18/19 1733   02/19/19 0500  CBC  Tomorrow morning,   R     02/18/19 1733   02/18/19 1500  SARS CORONAVIRUS 2 (TAT 6-24 HRS) Nasopharyngeal Nasopharyngeal Swab  (Tier 3 (TAT 6-24 hrs))  Once,   STAT    Question Answer Comment  Is this test for diagnosis or screening Screening   Symptomatic for COVID-19 as defined by CDC No   Hospitalized for COVID-19 No   Admitted to ICU for COVID-19 No   Previously tested for COVID-19 Yes   Resident in a congregate (group) care setting No   Employed in healthcare setting No   Pregnant No      02/18/19 1459   02/18/19 1421  Urine culture  ONCE - STAT,   STAT     02/18/19 1420           Vitals/Pain Today's Vitals   02/18/19 1500 02/18/19 1515 02/18/19 1523 02/18/19 1633  BP: (!) 91/57   111/79  Pulse: 90 87  88  Resp:    15  Temp:      TempSrc:      SpO2: 97% 98%  100%  PainSc:   8      Isolation Precautions No active isolations  Medications Medications  sodium chloride (PF) 0.9 % injection (has no administration in time range)  0.9 %  sodium chloride infusion (has no administration in time range)  metoCLOPramide (REGLAN) injection 5 mg (has no administration in time range)  promethazine (PHENERGAN) injection 12.5 mg (has no administration in time range)  lisinopril (ZESTRIL) tablet 2.5 mg (has no administration in time range)  traZODone (DESYREL) tablet 50 mg (has no administration in time range)  pantoprazole (PROTONIX) EC tablet 40 mg (has no administration in time range)  ferrous sulfate TBEC 325 mg (has no administration in time range)  albuterol (VENTOLIN HFA) 108 (90 Base) MCG/ACT inhaler 1-2 puff (has no administration in time range)  enoxaparin (LOVENOX) injection 40 mg (has no administration in time range)  haloperidol lactate (HALDOL) injection 5 mg (5 mg Intravenous Given 02/18/19 1235)  sodium chloride 0.9 % bolus 1,000 mL (0 mLs Intravenous Stopped 02/18/19 1344)  iohexol (OMNIPAQUE) 300 MG/ML solution 100 mL (100 mLs Intravenous Contrast Given 02/18/19 1356)  HYDROmorphone (DILAUDID) injection 1 mg (1 mg Intravenous Given 02/18/19 1344)  HYDROmorphone (DILAUDID) injection 1 mg (1 mg Intravenous Given 02/18/19 1524)  ondansetron (ZOFRAN) injection 4 mg (4 mg Intravenous Given 02/18/19 1524)  0.9 %  sodium chloride infusion ( Intravenous New Bag/Given 02/18/19 1522)    Mobility walks Low fall risk   Focused Assessments .   R Recommendations: See Admitting Provider Note  Report given to:   Additional Notes: n/a

## 2019-02-18 NOTE — ED Triage Notes (Signed)
Patient c/o N/V and abdominal pain x3 days. Seen for same x2 days. Hx gastroparesis.

## 2019-02-18 NOTE — Progress Notes (Signed)
Pt arrived from ED. VS stable. Will continue to monitor.

## 2019-02-18 NOTE — ED Provider Notes (Signed)
[facility] DEPT Provider Note   CSN: [id number] Arrival date & time: 02/17/19  1541     History Chief Complaint  Patient presents with  . Abdominal Pain  . Emesis    [name] is a 29 y.o. female.  Past medical history diabetic gastroparesis, diabetes presented to ER with complaints of abdominal pain, vomiting since this morning.  States symptoms identical to presentation yesterday.  Cannot recall number of episodes of vomiting that she has had today.  No blood.  No dysuria or hematuria.  States feels similar to prior episodes of gastroparesis.  Reviewed chart, similar presentation yesterday.  Recent admission for DKA at [facility].  HPI     Past Medical History:  Diagnosis Date  . Anxiety   . Arthritis    "hands, feet, knees" (12/18/2016)  . Asthma   . Diabetic gastroparesis ([place])    Per gastric emptying study 07/09/16 which showed significant delayed gastric emptying.  . Gallstones   . Gastroparesis   . GERD (gastroesophageal reflux disease)   . Heart murmur   . Hepatic steatosis 11/26/2014   and hepatomegaly  . Hypertension    hx (12/18/2016)  . Intractable cyclical vomiting syndrome    Archie Endo 12/18/2016  . Liver mass 11/26/2014  . Pancreatitis, acute 11/26/2014  . Pneumonia    "as a teen X 1" (12/18/2016)  . Type I diabetes mellitus ([place]) 2007   IDDM.  poorly controlled, multiple admits with DKA    Patient Active Problem List   Diagnosis Date Noted  . Gastroparesis due to secondary diabetes ([place]) 11/21/2018  . Diabetes mellitus ([place]) 11/13/2018  . HTN (hypertension) 11/13/2018  . Marijuana abuse 11/13/2018  . Acute pyelonephritis 10/07/2018  . Hydronephrosis of right kidney 10/05/2018  . Diffuse pain   . Elevated blood pressure reading 12/03/2017  . Tachycardia 11/30/2017  . Hypernatremia 11/02/2017  . Nausea and vomiting 01/22/2017  . Nausea & vomiting 01/21/2017  . Intractable cyclical vomiting syndrome 12/18/2016  .  Leukocytosis 10/24/2016  . N&V (nausea and vomiting) 10/23/2016  . DKA, type 1 ([place]) 09/26/2016  . Thrombocytosis ([place]) 07/08/2016  . Candiduria, asymptomatic 06/23/2016  . Hyponatremia 06/13/2016  . Hematuria 05/13/2016  . UTI (urinary tract infection) 01/22/2016  . Diarrhea 11/09/2015  . Acute urinary retention   . Gastroparesis due to DM ([place]) 07/10/2015  . GERD (gastroesophageal reflux disease) 07/10/2015  . Depression with anxiety 07/10/2015  . Gastroparesis 06/22/2015  . Altered mental status 06/22/2015  . Volume depletion 06/10/2015  . Protein-calorie malnutrition, severe 06/10/2015  . Hyperglycemia   . Elevated bilirubin   . Hematemesis with nausea   . Intractable nausea and vomiting 05/28/2015  . Abdominal pain in female   . Abdominal pain 05/24/2015  . Hypertension 05/24/2015  . Dehydration   . Diabetic gastroparesis ([place])   . Chronic diastolic heart failure ([place]) 04/11/2015  . Hematemesis 04/08/2015  . DKA (diabetic ketoacidoses) ([place]) 03/22/2015  . S/P laparoscopic cholecystectomy 02/11/2015  . Postextubation stridor   . Pancreatitis, acute 11/26/2014  . Volume overload 11/26/2014  . Hypokalemia 11/26/2014  . Hepatic steatosis 11/26/2014  . Liver mass 11/26/2014  . Sepsis ([place]) 11/25/2014  . Sinus tachycardia 11/25/2014  . Hypomagnesemia 11/25/2014  . Hypophosphatemia 11/25/2014  . Elevated LFTs 11/24/2014  . AKI (acute kidney injury) ([place]) 11/24/2014  . Migraine headache 11/24/2014  . Asthma 06/29/2012  . Uncontrolled type 1 diabetes mellitus ([place]) 06/19/2010  . Goiter, unspecified 06/19/2010    Past Surgical History:  Procedure  Laterality Date  . CHOLECYSTECTOMY N/A 02/11/2015   Procedure: LAPAROSCOPIC CHOLECYSTECTOMY WITH INTRAOPERATIVE CHOLANGIOGRAM;  Surgeon: Greer Pickerel, MD;  Location: [facility] ORS;  Service: General;  Laterality: N/A;  . ESOPHAGOGASTRODUODENOSCOPY (EGD) WITH PROPOFOL Left 09/20/2014   Procedure: ESOPHAGOGASTRODUODENOSCOPY (EGD) WITH  PROPOFOL;  Surgeon: Arta Silence, MD;  Location: [facility] END[facility]COPY;  Service: Endoscopy;  Laterality: Left;  . WISDOM TOOTH EXTRACTION       OB History    Gravida  2   Para  1   Term  0   Preterm  1   AB  1   Living  1     SAB  0   TAB  1   Ectopic  0   Multiple  0   Live Births  1           Family History  Problem Relation Age of Onset  . Heart disease Maternal Grandmother   . Heart disease Maternal Grandfather   . Diabetes Mother   . Hyperlipidemia Mother   . Hypertension Father   . Heart disease Father   . Hypertension Paternal Grandmother   . Cancer Paternal Grandfather     Social History   Tobacco Use  . Smoking status: Never Smoker  . Smokeless tobacco: Never Used  Substance Use Topics  . Alcohol use: No  . Drug use: Not Currently    Types: Marijuana    Home Medications Prior to Admission medications   Medication Sig Start Date End Date Taking? Authorizing Provider  albuterol (PROVENTIL HFA;VENTOLIN HFA) 108 (90 Base) MCG/ACT inhaler Inhale 1-2 puffs into the lungs every 6 (six) hours as needed for wheezing or shortness of breath.   Yes [provider]  ferrous sulfate 324 (65 Fe) MG TBEC Take 1 tablet by mouth every other day. 02/08/19  Yes [provider]  glucagon (GLUCAGON EMERGENCY) 1 MG injection Inject 1 mg into the muscle once as needed (For very low blood sugars. May repeat in 15-20 minutes if needed.). 12/20/16  Yes Hongalgi, Lenis Dickinson, MD  Insulin Human (INSULIN PUMP) SOLN Inject 0-10 each into the skin as directed. Sliding Scale. Novolog   Yes [provider]  lisinopril (ZESTRIL) 2.5 MG tablet Take 2.5 mg by mouth daily.   Yes [provider]  metoCLOPramide (REGLAN) 10 MG tablet Take 1 tablet (10 mg total) by mouth 3 (three) times daily with meals. 11/18/18 11/18/19 Yes Kathie Dike, MD  naproxen (NAPROSYN) 375 MG tablet Take 1 tablet (375 mg total) by mouth 2 (two) times daily as needed. Patient  taking differently: Take 375 mg by mouth 2 (two) times daily as needed for moderate pain.  12/12/18  Yes Albrizze, Kaitlyn E, PA-C  pantoprazole (PROTONIX) 40 MG tablet Take 40 mg by mouth daily. 02/08/19  Yes [provider]  promethazine (PHENERGAN) 25 MG tablet Take 25 mg by mouth every 6 (six) hours as needed for nausea or vomiting.  02/08/19  Yes [provider]  traZODone (DESYREL) 50 MG tablet Take 50 mg by mouth at bedtime.  11/30/18  Yes [provider]  blood glucose meter kit and supplies Dispense based on patient and insurance preference. Use up to four times daily as directed. (FOR ICD-10 E10.9, E11.9). 12/07/17   Eugenie Filler, MD  dicyclomine (BENTYL) 20 MG tablet Take 1 tablet (20 mg total) by mouth every 8 (eight) hours as needed for spasms. Patient not taking: Reported on 02/17/2019 12/31/18   Petrucelli, Aldona Bar R, PA-C  insulin aspart (  NOVOLOG) 100 UNIT/ML injection Inject 0-9 Units into the skin 3 (three) times daily with meals. Patient not taking: Reported on 02/17/2019 01/20/19   Shelly Coss, MD  insulin glargine (LANTUS) 100 UNIT/ML injection Inject 0.3 mLs (30 Units total) into the skin daily. Patient not taking: Reported on 02/17/2019 01/20/19   Shelly Coss, MD  Insulin Pen Needle 31G X 5 MM MISC 30 Units by Does not apply route at bedtime. 12/07/17   Eugenie Filler, MD  ondansetron (ZOFRAN ODT) 4 MG disintegrating tablet Take 1 tablet (4 mg total) by mouth every 8 (eight) hours as needed for nausea or vomiting. Patient not taking: Reported on 02/17/2019 11/18/18   Kathie Dike, MD  promethazine (PHENERGAN) 25 MG suppository Place 1 suppository (25 mg total) rectally every 6 (six) hours as needed for nausea or vomiting. 01/22/19   Montine Circle, PA-C    Allergies    Peanut-containing drug products, Food, and Ultram [tramadol]  Review of Systems   Review of Systems  Constitutional: Negative for chills and fever.  HENT: Negative  for ear pain and sore throat.   Eyes: Negative for pain and visual disturbance.  Respiratory: Negative for cough and shortness of breath.   Cardiovascular: Negative for chest pain and palpitations.  Gastrointestinal: Positive for abdominal pain, nausea and vomiting.  Genitourinary: Negative for dysuria and hematuria.  Musculoskeletal: Negative for arthralgias and back pain.  Skin: Negative for color change and rash.  Neurological: Negative for seizures and syncope.  All other systems reviewed and are negative.   Physical Exam Updated Vital Signs BP (!) 141/97   Pulse (!) 114   Temp 99.3 F (37.4 C) (Oral)   Resp 16   Ht 5' 4" (1.626 m)   Wt 54.4 kg   SpO2 99%   BMI 20.60 kg/m   Physical Exam Vitals and nursing note reviewed.  Constitutional:      General: She is not in acute distress.    Appearance: She is well-developed.  HENT:     Head: Normocephalic and atraumatic.  Eyes:     Conjunctiva/sclera: Conjunctivae normal.  Cardiovascular:     Rate and Rhythm: Normal rate and regular rhythm.     Heart sounds: No murmur.  Pulmonary:     Effort: Pulmonary effort is normal. No respiratory distress.     Breath sounds: Normal breath sounds.  Abdominal:     General: Bowel sounds are normal.     Palpations: Abdomen is soft. There is no mass.     Tenderness: There is no right CVA tenderness, left CVA tenderness, guarding or rebound.     Comments: Mild epigastrium tenderness, no tenderness throughout remainder of abdomen  Musculoskeletal:     Cervical back: Neck supple.  Skin:    General: Skin is warm and dry.     Capillary Refill: Capillary refill takes less than 2 seconds.  Neurological:     Mental Status: She is alert.     ED Results / Procedures / Treatments   Labs (all labs ordered are listed, but only abnormal results are displayed) Labs Reviewed  COMPREHENSIVE METABOLIC PANEL - Abnormal; Notable for the following components:      Result Value   AST 14 (*)     All other components within normal limits  CBC - Abnormal; Notable for the following components:   RBC 3.84 (*)    Hemoglobin 9.7 (*)    HCT 32.0 (*)    MCH 25.3 (*)    Platelets 489 (*)  All other components within normal limits  URINALYSIS, ROUTINE W REFLEX MICROSCOPIC - Abnormal; Notable for the following components:   APPearance HAZY (*)    Glucose, UA 150 (*)    Hgb urine dipstick MODERATE (*)    Ketones, ur 20 (*)    Protein, ur 100 (*)    Leukocytes,Ua LARGE (*)    RBC / HPF >50 (*)    WBC, UA >50 (*)    Bacteria, UA RARE (*)    Non Squamous Epithelial 0-5 (*)    All other components within normal limits  BLOOD GAS, VENOUS - Abnormal; Notable for the following components:   pO2, Ven 56.7 (*)    Acid-Base Excess 2.8 (*)    All other components within normal limits  CBG MONITORING, ED - Abnormal; Notable for the following components:   Glucose-Capillary 160 (*)    All other components within normal limits  CBG MONITORING, ED - Abnormal; Notable for the following components:   Glucose-Capillary 31 (*)    All other components within normal limits  CBG MONITORING, ED - Abnormal; Notable for the following components:   Glucose-Capillary 180 (*)    All other components within normal limits  LIPASE, BLOOD  I-STAT BETA HCG BLOOD, ED ([facility], [facility], [facility] ONLY)  CBG MONITORING, ED    EKG None  Radiology DG Abd Acute W/Chest  Result Date: 02/16/2019 CLINICAL DATA:  Abdominal pain. EXAM: DG ABDOMEN ACUTE W/ 1V CHEST COMPARISON:  CT 12/28/2018.  Abdomen series 12/21/2018. FINDINGS: PowerPort catheter noted with tip over superior vena cava. No acute cardiopulmonary disease identified. Surgical clips right upper quadrant. Stool noted throughout the colon. No bowel distention or free air. Pelvic calcifications consistent phleboliths again noted. No acute bony abnormality identified. IMPRESSION: 1. PowerPort catheter with tip over superior vena cava. No acute cardiopulmonary disease. 2. No  acute intra-abdominal abnormality identified. Stool noted throughout the colon. Electronically Signed   By: Marcello Moores  Register   On: 02/16/2019 14:40    Procedures Procedures (including critical care time)  Medications Ordered in ED Medications  sodium chloride flush (NS) 0.9 % injection 3 mL (3 mLs Intravenous Given 02/17/19 2104)  promethazine (PHENERGAN) injection 12.5 mg (12.5 mg Intravenous Given 02/17/19 2103)  HYDROmorphone (DILAUDID) injection 1 mg (1 mg Intravenous Given 02/17/19 2104)  dextrose 50 % solution (50 mLs  Given 02/17/19 2154)  sodium chloride 0.9 % bolus 1,000 mL (0 mLs Intravenous Stopped 02/18/19 0017)  droperidol (INAPSINE) 2.5 MG/ML injection 1.25 mg (1.25 mg Intravenous Given 02/17/19 2224)  fentaNYL (SUBLIMAZE) injection 50 mcg (50 mcg Intravenous Given 02/17/19 2256)  heparin lock flush 100 UNIT/ML injection (500 Units  Given 02/18/19 0014)    ED Course  I have reviewed the triage vital signs and the nursing notes.  Pertinent labs & imaging results that were available during my care of the patient were reviewed by me and considered in my medical decision making (see chart for details).  Clinical Course as of Feb 17 22  Fri Feb 17, 2019  2332 Recheck patient, symptoms resolved, requesting [place] sandwich, and passed p.o. trial, will discharge home   [RD]    Clinical Course User Index [RD] Lucrezia Starch, MD   MDM Rules/Calculators/A&P                      29 year old presenting to ER with complaints of nausea and vomiting.  On exam she is well-appearing, no acute distress.  Noted mild tachycardia.  Based on  symptomatology, review of chart, suspect this is related to her chronic gastroparesis, I have a low suspicion for new acute abdominal pathology.  Labs were reassuring, no leukocytosis, no DKA.  After aggressive symptom control, patient symptoms resolved, she is tolerating p.o. without any further difficulty.  At this time, believe she is appropriate for  discharge and outpatient management however, reviewed strict return precautions with patient should her condition worsen.    After the discussed management above, the patient was determined to be safe for discharge.  The patient was in agreement with this plan and all questions regarding their care were answered.  ED return precautions were discussed and the patient will return to the ED with any significant worsening of condition.   Final Clinical Impression(s) / ED Diagnoses Final diagnoses:  Non-intractable vomiting with nausea, unspecified vomiting type  Gastroparesis    Rx / DC Orders ED Discharge Orders    None       Lucrezia Starch, MD 02/18/19 [phone]

## 2019-02-18 NOTE — ED Provider Notes (Signed)
[facility] DEPT Provider Note   CSN: [id number] Arrival date & time: 02/18/19  1142     History Chief Complaint  Patient presents with  . Emesis    [name] is a 29 y.o. female.  Patient with history of diabetic gastroparesis, was seen last night with similar symptoms.  Received pain medicine and nausea medicine.  Felt better and went home.  Symptoms got worse this morning.  History of the same.  Has had some abdominal cramping but mostly nausea and vomiting.  Unable to tolerate fluids at home.  The history is provided by the patient.  Emesis Severity:  Mild Timing:  Constant Progression:  Unchanged Chronicity:  Recurrent Relieved by:  Nothing Worsened by:  Nothing Associated symptoms: abdominal pain   Associated symptoms: no arthralgias, no chills, no cough, no fever and no sore throat   Risk factors: diabetes   Risk factors: no sick contacts        Past Medical History:  Diagnosis Date  . Anxiety   . Arthritis    "hands, feet, knees" (12/18/2016)  . Asthma   . Diabetic gastroparesis ([place])    Per gastric emptying study 07/09/16 which showed significant delayed gastric emptying.  . Gallstones   . Gastroparesis   . GERD (gastroesophageal reflux disease)   . Heart murmur   . Hepatic steatosis 11/26/2014   and hepatomegaly  . Hypertension    hx (12/18/2016)  . Intractable cyclical vomiting syndrome    Archie Endo 12/18/2016  . Liver mass 11/26/2014  . Pancreatitis, acute 11/26/2014  . Pneumonia    "as a teen X 1" (12/18/2016)  . Type I diabetes mellitus ([place]) 2007   IDDM.  poorly controlled, multiple admits with DKA    Patient Active Problem List   Diagnosis Date Noted  . Gastroparesis due to secondary diabetes ([place]) 11/21/2018  . Diabetes mellitus ([place]) 11/13/2018  . HTN (hypertension) 11/13/2018  . Marijuana abuse 11/13/2018  . Acute pyelonephritis 10/07/2018  . Hydronephrosis of right kidney 10/05/2018  . Diffuse pain    . Elevated blood pressure reading 12/03/2017  . Tachycardia 11/30/2017  . Hypernatremia 11/02/2017  . Nausea and vomiting 01/22/2017  . Nausea & vomiting 01/21/2017  . Intractable cyclical vomiting syndrome 12/18/2016  . Leukocytosis 10/24/2016  . N&V (nausea and vomiting) 10/23/2016  . DKA, type 1 ([place]) 09/26/2016  . Thrombocytosis ([place]) 07/08/2016  . Candiduria, asymptomatic 06/23/2016  . Hyponatremia 06/13/2016  . Hematuria 05/13/2016  . UTI (urinary tract infection) 01/22/2016  . Diarrhea 11/09/2015  . Acute urinary retention   . Gastroparesis due to DM ([place]) 07/10/2015  . GERD (gastroesophageal reflux disease) 07/10/2015  . Depression with anxiety 07/10/2015  . Gastroparesis 06/22/2015  . Altered mental status 06/22/2015  . Volume depletion 06/10/2015  . Protein-calorie malnutrition, severe 06/10/2015  . Hyperglycemia   . Elevated bilirubin   . Hematemesis with nausea   . Intractable nausea and vomiting 05/28/2015  . Abdominal pain in female   . Abdominal pain 05/24/2015  . Hypertension 05/24/2015  . Dehydration   . Diabetic gastroparesis ([place])   . Chronic diastolic heart failure ([place]) 04/11/2015  . Hematemesis 04/08/2015  . DKA (diabetic ketoacidoses) ([place]) 03/22/2015  . S/P laparoscopic cholecystectomy 02/11/2015  . Postextubation stridor   . Pancreatitis, acute 11/26/2014  . Volume overload 11/26/2014  . Hypokalemia 11/26/2014  . Hepatic steatosis 11/26/2014  . Liver mass 11/26/2014  . Sepsis ([place]) 11/25/2014  . Sinus tachycardia 11/25/2014  . Hypomagnesemia 11/25/2014  .  Hypophosphatemia 11/25/2014  . Elevated LFTs 11/24/2014  . AKI (acute kidney injury) ([place]) 11/24/2014  . Migraine headache 11/24/2014  . Asthma 06/29/2012  . Uncontrolled type 1 diabetes mellitus ([place]) 06/19/2010  . Goiter, unspecified 06/19/2010    Past Surgical History:  Procedure Laterality Date  . CHOLECYSTECTOMY N/A 02/11/2015   Procedure: LAPAROSCOPIC CHOLECYSTECTOMY WITH  INTRAOPERATIVE CHOLANGIOGRAM;  Surgeon: Greer Pickerel, MD;  Location: [facility];  Service: General;  Laterality: N/A;  . ESOPHAGOGASTRODUODENOSCOPY (EGD) WITH PROPOFOL Left 09/20/2014   Procedure: ESOPHAGOGASTRODUODENOSCOPY (EGD) WITH PROPOFOL;  Surgeon: Arta Silence, MD;  Location: [facility];  Service: Endoscopy;  Laterality: Left;  . WISDOM TOOTH EXTRACTION       OB History    Gravida  2   Para  1   Term  0   Preterm  1   AB  1   Living  1     SAB  0   TAB  1   Ectopic  0   Multiple  0   Live Births  1           Family History  Problem Relation Age of Onset  . Heart disease Maternal Grandmother   . Heart disease Maternal Grandfather   . Diabetes Mother   . Hyperlipidemia Mother   . Hypertension Father   . Heart disease Father   . Hypertension Paternal Grandmother   . Cancer Paternal Grandfather     Social History   Tobacco Use  . Smoking status: Never Smoker  . Smokeless tobacco: Never Used  Substance Use Topics  . Alcohol use: No  . Drug use: Not Currently    Types: Marijuana    Home Medications Prior to Admission medications   Medication Sig Start Date End Date Taking? Authorizing Provider  albuterol (PROVENTIL HFA;VENTOLIN HFA) 108 (90 Base) MCG/ACT inhaler Inhale 1-2 puffs into the lungs every 6 (six) hours as needed for wheezing or shortness of breath.   Yes [provider]  ferrous sulfate 324 (65 Fe) MG TBEC Take 1 tablet by mouth every other day. 02/08/19  Yes [provider]  glucagon (GLUCAGON EMERGENCY) 1 MG injection Inject 1 mg into the muscle once as needed (For very low blood sugars. May repeat in 15-20 minutes if needed.). 12/20/16  Yes Hongalgi, Lenis Dickinson, MD  Insulin Human (INSULIN PUMP) SOLN Inject 0-10 each into the skin continuous. Per sliding scale - Novolog 100 units/ml   Yes [provider]  lisinopril (ZESTRIL) 2.5 MG tablet Take 2.5 mg by mouth daily.   Yes [provider]  metoCLOPramide  (REGLAN) 10 MG tablet Take 1 tablet (10 mg total) by mouth 3 (three) times daily with meals. 11/18/18 11/18/19 Yes Kathie Dike, MD  pantoprazole (PROTONIX) 40 MG tablet Take 40 mg by mouth daily. 02/08/19  Yes [provider]  promethazine (PHENERGAN) 25 MG suppository Place 1 suppository (25 mg total) rectally every 6 (six) hours as needed for nausea or vomiting. 01/22/19  Yes Montine Circle, PA-C  promethazine (PHENERGAN) 25 MG tablet Take 25 mg by mouth every 6 (six) hours as needed for nausea or vomiting.  02/08/19  Yes [provider]  traZODone (DESYREL) 50 MG tablet Take 50 mg by mouth at bedtime.  11/30/18  Yes [provider]  blood glucose meter kit and supplies Dispense based on patient and insurance preference. Use up to four times daily as directed. (FOR ICD-10 E10.9, E11.9). 12/07/17   Eugenie Filler, MD  dicyclomine (BENTYL) 20 MG  tablet Take 1 tablet (20 mg total) by mouth every 8 (eight) hours as needed for spasms. Patient not taking: Reported on 02/17/2019 12/31/18   Petrucelli, Samantha R, PA-C  insulin aspart (NOVOLOG) 100 UNIT/ML injection Inject 0-9 Units into the skin 3 (three) times daily with meals. Patient not taking: Reported on 02/17/2019 01/20/19   Shelly Coss, MD  insulin glargine (LANTUS) 100 UNIT/ML injection Inject 0.3 mLs (30 Units total) into the skin daily. Patient not taking: Reported on 02/17/2019 01/20/19   Shelly Coss, MD  Insulin Pen Needle 31G X 5 MM MISC 30 Units by Does not apply route at bedtime. 12/07/17   Eugenie Filler, MD  naproxen (NAPROSYN) 375 MG tablet Take 1 tablet (375 mg total) by mouth 2 (two) times daily as needed. Patient not taking: Reported on 02/18/2019 12/12/18   Albrizze, Verline Lema E, PA-C  ondansetron (ZOFRAN ODT) 4 MG disintegrating tablet Take 1 tablet (4 mg total) by mouth every 8 (eight) hours as needed for nausea or vomiting. Patient not taking: Reported on 02/17/2019 11/18/18   Kathie Dike, MD     Allergies    Peanut-containing drug products, Food, and Ultram [tramadol]  Review of Systems   Review of Systems  Constitutional: Negative for chills and fever.  HENT: Negative for ear pain and sore throat.   Eyes: Negative for pain and visual disturbance.  Respiratory: Negative for cough and shortness of breath.   Cardiovascular: Negative for chest pain and palpitations.  Gastrointestinal: Positive for abdominal pain, nausea and vomiting.  Genitourinary: Negative for dysuria and hematuria.  Musculoskeletal: Negative for arthralgias and back pain.  Skin: Negative for color change and rash.  Neurological: Negative for seizures and syncope.  All other systems reviewed and are negative.   Physical Exam Updated Vital Signs BP (!) 91/57   Pulse 87   Temp 98.8 F (37.1 C) (Oral)   Resp 15   SpO2 98%   Physical Exam Vitals and nursing note reviewed.  Constitutional:      General: She is in acute distress.     Appearance: She is well-developed. She is ill-appearing.  HENT:     Head: Normocephalic and atraumatic.     Nose: Nose normal.  Eyes:     Conjunctiva/sclera: Conjunctivae normal.     Pupils: Pupils are equal, round, and reactive to light.  Cardiovascular:     Rate and Rhythm: Normal rate and regular rhythm.     Pulses: Normal pulses.     Heart sounds: Normal heart sounds. No murmur.  Pulmonary:     Effort: Pulmonary effort is normal. No respiratory distress.     Breath sounds: Normal breath sounds.  Abdominal:     General: There is no distension.     Palpations: Abdomen is soft.     Tenderness: There is abdominal tenderness (diffuse).  Musculoskeletal:     Cervical back: Neck supple.  Skin:    General: Skin is warm and dry.     Capillary Refill: Capillary refill takes less than 2 seconds.  Neurological:     General: No focal deficit present.     Mental Status: She is alert.  Psychiatric:        Mood and Affect: Mood normal.     ED Results / Procedures  / Treatments   Labs (all labs ordered are listed, but only abnormal results are displayed) Labs Reviewed  CBC WITH DIFFERENTIAL/PLATELET - Abnormal; Notable for the following components:      Result Value  RBC 3.78 (*)    Hemoglobin 9.7 (*)    HCT 31.4 (*)    MCH 25.7 (*)    Platelets 464 (*)    All other components within normal limits  COMPREHENSIVE METABOLIC PANEL - Abnormal; Notable for the following components:   Glucose, Bld 306 (*)    All other components within normal limits  URINALYSIS, ROUTINE W REFLEX MICROSCOPIC - Abnormal; Notable for the following components:   APPearance HAZY (*)    Glucose, UA >=500 (*)    Hgb urine dipstick MODERATE (*)    Ketones, ur 20 (*)    Protein, ur 30 (*)    Leukocytes,Ua LARGE (*)    RBC / HPF >50 (*)    WBC, UA >50 (*)    Bacteria, UA RARE (*)    Non Squamous Epithelial 0-5 (*)    All other components within normal limits  RAPID URINE DRUG SCREEN, HOSP PERFORMED - Abnormal; Notable for the following components:   Opiates POSITIVE (*)    All other components within normal limits  URINE CULTURE  SARS CORONAVIRUS 2 (TAT 6-24 HRS)  LIPASE, BLOOD  CBG MONITORING, ED  I-STAT BETA HCG BLOOD, ED ([facility], [facility], [facility] ONLY)    EKG None  Radiology CT ABDOMEN PELVIS W CONTRAST  Result Date: 02/18/2019 CLINICAL DATA:  Severe diffuse abdominal pain, nausea, and vomiting for 3 days. EXAM: CT ABDOMEN AND PELVIS WITH CONTRAST TECHNIQUE: Multidetector CT imaging of the abdomen and pelvis was performed using the standard protocol following bolus administration of intravenous contrast. CONTRAST:  17m OMNIPAQUE IOHEXOL 300 MG/ML  SOLN COMPARISON:  12/28/2018 FINDINGS: Lower Chest: No acute findings. Hepatobiliary: No hepatic masses identified. Prior cholecystectomy. No evidence of biliary obstruction. Pancreas:  No mass or inflammatory changes. Spleen: Within normal limits in size and appearance. Adrenals/Urinary Tract: No masses identified. No evidence of  hydronephrosis. Areas of decreased enhancement in the right kidney decreased since previous study, consistent with resolving pyelonephritis. Previously seen right hydroureteronephrosis has resolved. Diffuse bladder wall thickening and mucosal enhancement show mild improvement, consistent with improving cystitis. Stomach/Bowel: Persistent wall thickening distal thoracic esophagus, consistent with esophagitis. No evidence of hiatal hernia. No evidence of bowel obstruction or wall thickening, or abnormal fluid collections. Vascular/Lymphatic: No pathologically enlarged lymph nodes. No abdominal aortic aneurysm. Reproductive: 1.7 cm posterior uterine fibroid noted. Adnexal regions are unremarkable. Other:  None. Musculoskeletal:  No suspicious bone lesions identified. IMPRESSION: 1. No acute findings. 2. Improving right pyelonephritis and resolution of right hydroureteronephrosis since previous study. 3. Interval decrease in diffuse bladder wall thickening, consistent with improving cystitis. 4. Stable wall thickening of distal thoracic esophagus, consistent with esophagitis. 5. 1.7 cm posterior uterine fibroid. Electronically Signed   By: JMarlaine HindM.D.   On: 02/18/2019 14:17    Procedures Procedures (including critical care time)  Medications Ordered in ED Medications  sodium chloride (PF) 0.9 % injection (has no administration in time range)  haloperidol lactate (HALDOL) injection 5 mg (5 mg Intravenous Given 02/18/19 1235)  sodium chloride 0.9 % bolus 1,000 mL (0 mLs Intravenous Stopped 02/18/19 1344)  iohexol (OMNIPAQUE) 300 MG/ML solution 100 mL (100 mLs Intravenous Contrast Given 02/18/19 1356)  HYDROmorphone (DILAUDID) injection 1 mg (1 mg Intravenous Given 02/18/19 1344)  HYDROmorphone (DILAUDID) injection 1 mg (1 mg Intravenous Given 02/18/19 1524)  ondansetron (ZOFRAN) injection 4 mg (4 mg Intravenous Given 02/18/19 1524)  0.9 %  sodium chloride infusion ( Intravenous New Bag/Given 02/18/19 1522)      ED  Course  I have reviewed the triage vital signs and the nursing notes.  Pertinent labs & imaging results that were available during my care of the patient were reviewed by me and considered in my medical decision making (see chart for details).    MDM Rules/Calculators/A&P  Ellora Mozingo is a 29 year old female with history of diabetes, gastroparesis who presents to the ED with nausea and vomiting.  Patient states she was seen last night with similar symptoms.  Felt better after fluids and pain medicine last night.  However pain got worse this morning.  Continues to have nausea and vomiting and unable to tolerate fluids.  Appears uncomfortable on exam.  No focal abdominal tenderness on exam.  Abdomen is soft.  Suspect likely gastroparesis flare.  Labs last night were negative for DKA.  However will reevaluate with lab work, IV fluids.  Will start with IV Haldol.  Will reevaluate for further pain medicine and possible admission.  Patient with unremarkable CT scan.  No significant anemia, electrolyte abnormality, kidney injury.  Blood sugar 306 with patient is not in DKA.  No signs of bowel obstruction on CT scan.  Urinalysis with possible contamination.  Urine culture has been sent.  Will discuss need for antibiotic with hospitalist.  CT scan does mention improvement of bladder wall thickening.  Overall improving cystitis.  After several rounds of IV antiemetics and IV pain medications patient still uncomfortable.  Will talk with hospitalist about admission for likely cyclical vomiting syndrome versus gastroparesis.  This chart was dictated using voice recognition software.  Despite best efforts to proofread,  errors can occur which can change the documentation meaning.   Final Clinical Impression(s) / ED Diagnoses Final diagnoses:  Intractable vomiting with nausea, unspecified vomiting type  Abdominal pain, unspecified abdominal location    Rx / DC Orders ED Discharge Orders    None        Lennice Sites, DO 02/18/19 1537

## 2019-02-18 NOTE — H&P (Signed)
History and Physical    [name] HEN:[id number] DOB: 1990/05/03 DOA: 02/18/2019  PCP: Vicenta Aly, FNP   Patient coming from: Home    Chief Complaint: Intractable nausea and vomiting  HPI: [name] is a 29 y.o. female with medical history significant of type 1 diabetes, GERD, asthma, diabetic gastroparesis, hypertension, intractable cyclic vomiting syndrome, pancreatitis who presented with intractable nausea and vomiting along with abdominal pain.  This has been going on for last 3 days.  She was unable to tolerate anything by mouth.  Patient had numerous admissions with same complaints in the past and sometimes with DKA.  She had 15 ED visits in the last 6 months to [facility] health system.  She was recently admitted to [facility] on July 21 and was discharged on July 26.  She went to [facility] because she had to wait for long time in the emergency department here.  She was managed for DKA. She got a port on the right chest during that hospitalization. Patient is currently on insulin pump at home.  Her last presentation to the emergency  department was in 02/16/2019 and she was discharged home from ED. Patient seen and examined at the bedside in the emergency department.  Currently she is hemodynamically stable.  She complains of nausea, vomiting and abdominal pain like before.  She denies any fever, chills, chest pain, shortness of breath, diarrhea.  ED Course: Lab works did not show DKA at this time.  Since patient is unable to tolerate anything by mouth, decision was made to admit to treat with IV fluids and antiemetics  Review of Systems: As per HPI otherwise 10 point review of systems negative.    Past Medical History:  Diagnosis Date  . Anxiety   . Arthritis    "hands, feet, knees" (12/18/2016)  . Asthma   . Diabetic gastroparesis ([place])    Per gastric emptying study 07/09/16 which showed significant delayed gastric emptying.  . Gallstones   . Gastroparesis   . GERD  (gastroesophageal reflux disease)   . Heart murmur   . Hepatic steatosis 11/26/2014   and hepatomegaly  . Hypertension    hx (12/18/2016)  . Intractable cyclical vomiting syndrome    Archie Endo 12/18/2016  . Liver mass 11/26/2014  . Pancreatitis, acute 11/26/2014  . Pneumonia    "as a teen X 1" (12/18/2016)  . Type I diabetes mellitus ([place]) 2007   IDDM.  poorly controlled, multiple admits with DKA    Past Surgical History:  Procedure Laterality Date  . CHOLECYSTECTOMY N/A 02/11/2015   Procedure: LAPAROSCOPIC CHOLECYSTECTOMY WITH INTRAOPERATIVE CHOLANGIOGRAM;  Surgeon: Greer Pickerel, MD;  Location: [facility] ORS;  Service: General;  Laterality: N/A;  . ESOPHAGOGASTRODUODENOSCOPY (EGD) WITH PROPOFOL Left 09/20/2014   Procedure: ESOPHAGOGASTRODUODENOSCOPY (EGD) WITH PROPOFOL;  Surgeon: Arta Silence, MD;  Location: [facility] ENDOSCOPY;  Service: Endoscopy;  Laterality: Left;  . WISDOM TOOTH EXTRACTION       reports that she has never smoked. She has never used smokeless tobacco. She reports previous drug use. Drug: Marijuana. She reports that she does not drink alcohol.  Allergies  Allergen Reactions  . Peanut-Containing Drug Products Swelling and Other (See Comments)    Reaction:  Swelling of mouth and lips   . 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   Reaction:  Swelling of mouth and lips   . Ultram [Tramadol] Itching    Family History  Problem Relation Age of Onset  . Heart disease  Maternal Grandmother   . Heart disease Maternal Grandfather   . Diabetes Mother   . Hyperlipidemia Mother   . Hypertension Father   . Heart disease Father   . Hypertension Paternal Grandmother   . Cancer Paternal Grandfather      Prior to Admission medications   Medication Sig Start Date End Date Taking? Authorizing Provider  albuterol (PROVENTIL HFA;VENTOLIN HFA) 108 (90 Base) MCG/ACT inhaler Inhale 1-2 puffs into the lungs every 6 (six) hours as needed for wheezing or  shortness of breath.   Yes [provider]  ferrous sulfate 324 (65 Fe) MG TBEC Take 1 tablet by mouth every other day. 02/08/19  Yes [provider]  glucagon (GLUCAGON EMERGENCY) 1 MG injection Inject 1 mg into the muscle once as needed (For very low blood sugars. May repeat in 15-20 minutes if needed.). 12/20/16  Yes Hongalgi, Lenis Dickinson, MD  Insulin Human (INSULIN PUMP) SOLN Inject 0-10 each into the skin continuous. Per sliding scale - Novolog 100 units/ml   Yes [provider]  lisinopril (ZESTRIL) 2.5 MG tablet Take 2.5 mg by mouth daily.   Yes [provider]  metoCLOPramide (REGLAN) 10 MG tablet Take 1 tablet (10 mg total) by mouth 3 (three) times daily with meals. 11/18/18 11/18/19 Yes Kathie Dike, MD  pantoprazole (PROTONIX) 40 MG tablet Take 40 mg by mouth daily. 02/08/19  Yes [provider]  promethazine (PHENERGAN) 25 MG suppository Place 1 suppository (25 mg total) rectally every 6 (six) hours as needed for nausea or vomiting. 01/22/19  Yes Montine Circle, PA-C  promethazine (PHENERGAN) 25 MG tablet Take 25 mg by mouth every 6 (six) hours as needed for nausea or vomiting.  02/08/19  Yes [provider]  traZODone (DESYREL) 50 MG tablet Take 50 mg by mouth at bedtime.  11/30/18  Yes [provider]  blood glucose meter kit and supplies Dispense based on patient and insurance preference. Use up to four times daily as directed. (FOR ICD-10 E10.9, E11.9). 12/07/17   Eugenie Filler, MD  Insulin Pen Needle 31G X 5 MM MISC 30 Units by Does not apply route at bedtime. 12/07/17   Eugenie Filler, MD    Physical Exam: Vitals:   02/18/19 1445 02/18/19 1500 02/18/19 1515 02/18/19 1633  BP:  (!) 91/57  111/79  Pulse: 88 90 87 88  Resp:    15  Temp:      TempSrc:      SpO2: 98% 97% 98% 100%    Constitutional: Not in distress, generalized weakness Vitals:   02/18/19 1445 02/18/19 1500 02/18/19 1515 02/18/19 1633  BP:  (!)  91/57  111/79  Pulse: 88 90 87 88  Resp:    15  Temp:      TempSrc:      SpO2: 98% 97% 98% 100%   Eyes: PERRL, lids and conjunctivae normal Neck: normal, supple, no masses, no thyromegaly Respiratory: clear to auscultation bilaterally, no wheezing, no crackles. Normal respiratory effort. No accessory muscle use.  Port on the right chest Cardiovascular: Regular rate and rhythm, no murmurs / rubs / gallops. No extremity edema.   Abdomen: no tenderness, no masses palpated. No hepatosplenomegaly. Bowel sounds positive.  Musculoskeletal: no clubbing / cyanosis. No joint deformity upper and lower extremities.  Skin: no rashes, lesions, ulcers. No induration Neurologic: CN 2-12 grossly intact. S. Strength 5/5 in all 4.  Psychiatric: Normal judgment and insight. Alert and oriented x 3.   Foley Catheter:None  Labs on Admission: I have personally reviewed following labs and imaging studies  CBC: Recent Labs  Lab 02/16/19 1342 02/17/19 2100 02/18/19 1246  WBC 10.1 8.4 8.1  NEUTROABS 7.7  --  6.3  HGB 9.6* 9.7* 9.7*  HCT 31.4* 32.0* 31.4*  MCV 84.4 83.3 83.1  PLT 473* 489* 774*   Basic Metabolic Panel: Recent Labs  Lab 02/16/19 1342 02/17/19 2100 02/18/19 1246  NA 140 139 136  K 3.8 3.8 3.8  CL 104 103 99  CO2 29 28 26   GLUCOSE 151* 84 306*  BUN 20 18 17   CREATININE 0.69 0.68 0.75  CALCIUM 9.3 9.5 9.1   GFR: Estimated Creatinine Clearance: 89.1 mL/min (by C-G formula based on SCr of 0.75 mg/dL). Liver Function Tests: Recent Labs  Lab 02/16/19 1342 02/17/19 2100 02/18/19 1246  AST 16 14* 15  ALT 10 10 10   ALKPHOS 69 72 67  BILITOT 0.7 0.8 0.7  PROT 7.2 7.4 6.8  ALBUMIN 4.0 4.3 3.8   Recent Labs  Lab 02/16/19 1342 02/17/19 2100 02/18/19 1246  LIPASE 15 14 13    No results for input(s): AMMONIA in the last 168 hours. Coagulation Profile: No results for input(s): INR, PROTIME in the last 168 hours. Cardiac Enzymes: No results for input(s): CKTOTAL, CKMB,  CKMBINDEX, TROPONINI in the last 168 hours. BNP (last 3 results) No results for input(s): PROBNP in the last 8760 hours. HbA1C: No results for input(s): HGBA1C in the last 72 hours. CBG: Recent Labs  Lab 02/17/19 1605 02/17/19 2146 02/17/19 2211 02/17/19 2351  GLUCAP 160* 31* 180* 97   Lipid Profile: No results for input(s): CHOL, HDL, LDLCALC, TRIG, CHOLHDL, LDLDIRECT in the last 72 hours. Thyroid Function Tests: No results for input(s): TSH, T4TOTAL, FREET4, T3FREE, THYROIDAB in the last 72 hours. Anemia Panel: No results for input(s): VITAMINB12, FOLATE, FERRITIN, TIBC, IRON, RETICCTPCT in the last 72 hours. Urine analysis:    Component Value Date/Time   COLORURINE YELLOW 02/18/2019 1246   APPEARANCEUR HAZY (A) 02/18/2019 1246   LABSPEC 1.014 02/18/2019 1246   PHURINE 7.0 02/18/2019 1246   GLUCOSEU >=500 (A) 02/18/2019 1246   GLUCOSEU >=1000 11/07/2012 1205   HGBUR MODERATE (A) 02/18/2019 1246   BILIRUBINUR NEGATIVE 02/18/2019 1246   KETONESUR 20 (A) 02/18/2019 1246   PROTEINUR 30 (A) 02/18/2019 1246   UROBILINOGEN 0.2 11/24/2014 1045   NITRITE NEGATIVE 02/18/2019 1246   LEUKOCYTESUR LARGE (A) 02/18/2019 1246    Radiological Exams on Admission: CT ABDOMEN PELVIS W CONTRAST  Result Date: 02/18/2019 CLINICAL DATA:  Severe diffuse abdominal pain, nausea, and vomiting for 3 days. EXAM: CT ABDOMEN AND PELVIS WITH CONTRAST TECHNIQUE: Multidetector CT imaging of the abdomen and pelvis was performed using the standard protocol following bolus administration of intravenous contrast. CONTRAST:  146m OMNIPAQUE IOHEXOL 300 MG/ML  SOLN COMPARISON:  12/28/2018 FINDINGS: Lower Chest: No acute findings. Hepatobiliary: No hepatic masses identified. Prior cholecystectomy. No evidence of biliary obstruction. Pancreas:  No mass or inflammatory changes. Spleen: Within normal limits in size and appearance. Adrenals/Urinary Tract: No masses identified. No evidence of hydronephrosis. Areas of  decreased enhancement in the right kidney decreased since previous study, consistent with resolving pyelonephritis. Previously seen right hydroureteronephrosis has resolved. Diffuse bladder wall thickening and mucosal enhancement show mild improvement, consistent with improving cystitis. Stomach/Bowel: Persistent wall thickening distal thoracic esophagus, consistent with esophagitis. No evidence of hiatal hernia. No evidence of bowel obstruction or wall thickening, or abnormal fluid collections. Vascular/Lymphatic: No pathologically enlarged lymph nodes. No  abdominal aortic aneurysm. Reproductive: 1.7 cm posterior uterine fibroid noted. Adnexal regions are unremarkable. Other:  None. Musculoskeletal:  No suspicious bone lesions identified. IMPRESSION: 1. No acute findings. 2. Improving right pyelonephritis and resolution of right hydroureteronephrosis since previous study. 3. Interval decrease in diffuse bladder wall thickening, consistent with improving cystitis. 4. Stable wall thickening of distal thoracic esophagus, consistent with esophagitis. 5. 1.7 cm posterior uterine fibroid. Electronically Signed   By: Marlaine Hind M.D.   On: 02/18/2019 14:17     Assessment/Plan Principal Problem:   Intractable nausea and vomiting Active Problems:   Uncontrolled type 1 diabetes mellitus ([place])   Hypertension   Nausea/vomiting/abdominal pain: Secondary to diabetic gastroparesis.  Takes Reglan and Phenergan at home. Continue IV fluids, started on clear liquid diet.  Continue IV Reglan and Phenergan as needed. I have advised her to take small frequent meals at home.  Diabetes type 1: Has been admitted several times with DKA in the past.  Follows with endocrinologist.  On insulin pump at home.  Continue sliding-scale and Lantus here.  Hemoglobin A1c was 7.6 on 11/21/2018.  Presence of urine leukocytes, RBCs:  Chronic finding.  She denies any dysuria or specific lower abdominal pain.  CT abdomen/pelvis done today  showed improving pyonephritis, cystitis. On her last admission,CT abdomen/pelvis showed possible pyelonephritis but she did not have any dysuria, no fever or leukocytosis or costovertebral angle tenderness so antibiotics not indicated .Urine culture had grown mixed organisms, Candida. No indication of antibiotic therapy at present.  Hypertension:On lisinopril at home .  Continue  Asthma: Currently stable. No wheezing.     Severity of Illness: The appropriate patient status for this patient is OBSERVATION.    DVT prophylaxis: Lovenox Code Status: Full Family Communication: None present at the bedside Consults called: None     Shelly Coss MD [facility] Pager [phone]  If 7PM-7AM, please contact night-coverage [email or web address] Password [facility]  02/18/2019, 5:34 PM

## 2019-02-19 DIAGNOSIS — Z794 Long term (current) use of insulin: Secondary | ICD-10-CM | POA: Diagnosis not present

## 2019-02-19 DIAGNOSIS — N319 Neuromuscular dysfunction of bladder, unspecified: Secondary | ICD-10-CM | POA: Diagnosis present

## 2019-02-19 DIAGNOSIS — Z888 Allergy status to other drugs, medicaments and biological substances status: Secondary | ICD-10-CM | POA: Diagnosis not present

## 2019-02-19 DIAGNOSIS — M19041 Primary osteoarthritis, right hand: Secondary | ICD-10-CM | POA: Diagnosis present

## 2019-02-19 DIAGNOSIS — M19042 Primary osteoarthritis, left hand: Secondary | ICD-10-CM | POA: Diagnosis present

## 2019-02-19 DIAGNOSIS — Z91010 Allergy to peanuts: Secondary | ICD-10-CM | POA: Diagnosis not present

## 2019-02-19 DIAGNOSIS — Z833 Family history of diabetes mellitus: Secondary | ICD-10-CM | POA: Diagnosis not present

## 2019-02-19 DIAGNOSIS — K3184 Gastroparesis: Secondary | ICD-10-CM | POA: Diagnosis not present

## 2019-02-19 DIAGNOSIS — E10649 Type 1 diabetes mellitus with hypoglycemia without coma: Secondary | ICD-10-CM | POA: Diagnosis not present

## 2019-02-19 DIAGNOSIS — J45909 Unspecified asthma, uncomplicated: Secondary | ICD-10-CM | POA: Diagnosis present

## 2019-02-19 DIAGNOSIS — N1330 Unspecified hydronephrosis: Secondary | ICD-10-CM | POA: Diagnosis present

## 2019-02-19 DIAGNOSIS — K2100 Gastro-esophageal reflux disease with esophagitis, without bleeding: Secondary | ICD-10-CM | POA: Diagnosis present

## 2019-02-19 DIAGNOSIS — D649 Anemia, unspecified: Secondary | ICD-10-CM | POA: Diagnosis present

## 2019-02-19 DIAGNOSIS — Z91018 Allergy to other foods: Secondary | ICD-10-CM | POA: Diagnosis not present

## 2019-02-19 DIAGNOSIS — I5032 Chronic diastolic (congestive) heart failure: Secondary | ICD-10-CM | POA: Diagnosis present

## 2019-02-19 DIAGNOSIS — I10 Essential (primary) hypertension: Secondary | ICD-10-CM | POA: Diagnosis not present

## 2019-02-19 DIAGNOSIS — Z8249 Family history of ischemic heart disease and other diseases of the circulatory system: Secondary | ICD-10-CM | POA: Diagnosis not present

## 2019-02-19 DIAGNOSIS — Z20822 Contact with and (suspected) exposure to covid-19: Secondary | ICD-10-CM | POA: Diagnosis present

## 2019-02-19 DIAGNOSIS — K859 Acute pancreatitis without necrosis or infection, unspecified: Secondary | ICD-10-CM | POA: Diagnosis present

## 2019-02-19 DIAGNOSIS — Z9641 Presence of insulin pump (external) (internal): Secondary | ICD-10-CM | POA: Diagnosis present

## 2019-02-19 DIAGNOSIS — R112 Nausea with vomiting, unspecified: Secondary | ICD-10-CM | POA: Diagnosis not present

## 2019-02-19 DIAGNOSIS — E1043 Type 1 diabetes mellitus with diabetic autonomic (poly)neuropathy: Secondary | ICD-10-CM | POA: Diagnosis present

## 2019-02-19 DIAGNOSIS — M170 Bilateral primary osteoarthritis of knee: Secondary | ICD-10-CM | POA: Diagnosis present

## 2019-02-19 DIAGNOSIS — F419 Anxiety disorder, unspecified: Secondary | ICD-10-CM | POA: Diagnosis present

## 2019-02-19 DIAGNOSIS — K5900 Constipation, unspecified: Secondary | ICD-10-CM | POA: Diagnosis present

## 2019-02-19 LAB — BASIC METABOLIC PANEL
Anion gap: 5 (ref 5–15)
BUN: 11 mg/dL (ref 6–20)
CO2: 26 mmol/L (ref 22–32)
Calcium: 8.2 mg/dL — ABNORMAL LOW (ref 8.9–10.3)
Chloride: 105 mmol/L (ref 98–111)
Creatinine, Ser: 0.56 mg/dL (ref 0.44–1.00)
GFR calc Af Amer: >60 mL/min (ref >60)
GFR calc non Af Amer: >60 mL/min (ref >60)
Glucose, Bld: 213 mg/dL — ABNORMAL HIGH (ref 70–99)
Potassium: 3.9 mmol/L (ref 3.5–5.1)
Sodium: 136 mmol/L (ref 135–145)

## 2019-02-19 LAB — GLUCOSE, CAPILLARY
Glucose-Capillary: 129 mg/dL — ABNORMAL HIGH (ref 70–99)
Glucose-Capillary: 170 mg/dL — ABNORMAL HIGH (ref 70–99)
Glucose-Capillary: 170 mg/dL — ABNORMAL HIGH (ref 70–99)
Glucose-Capillary: 209 mg/dL — ABNORMAL HIGH (ref 70–99)
Glucose-Capillary: 266 mg/dL — ABNORMAL HIGH (ref 70–99)
Glucose-Capillary: 83 mg/dL (ref 70–99)

## 2019-02-19 LAB — HEMOGLOBIN A1C
Hgb A1c MFr Bld: 7.8 % — ABNORMAL HIGH (ref 4.8–5.6)
Mean Plasma Glucose: 177.16 mg/dL

## 2019-02-19 LAB — CBC
HCT: 25.5 % — ABNORMAL LOW (ref 36.0–46.0)
Hemoglobin: 7.5 g/dL — ABNORMAL LOW (ref 12.0–15.0)
MCH: 25.2 pg — ABNORMAL LOW (ref 26.0–34.0)
MCHC: 29.4 g/dL — ABNORMAL LOW (ref 30.0–36.0)
MCV: 85.6 fL (ref 80.0–100.0)
Platelets: 350 10*3/uL (ref 150–400)
RBC: 2.98 MIL/uL — ABNORMAL LOW (ref 3.87–5.11)
RDW: 15.4 % (ref 11.5–15.5)
WBC: 6.5 10*3/uL (ref 4.0–10.5)
nRBC: 0 % (ref 0.0–0.2)

## 2019-02-19 MED ORDER — SODIUM CHLORIDE 0.9 % IV SOLN
250.0000 mg | Freq: Three times a day (TID) | INTRAVENOUS | Status: DC
  Administered 2019-02-19 – 2019-02-23 (×13): 250 mg via INTRAVENOUS
  Filled 2019-02-19 (×17): qty 5

## 2019-02-19 MED ORDER — CHLORHEXIDINE GLUCONATE CLOTH 2 % EX PADS
6.0000 | MEDICATED_PAD | Freq: Every day | CUTANEOUS | Status: DC
  Administered 2019-02-19 – 2019-02-22 (×4): 6 via TOPICAL

## 2019-02-19 MED ORDER — INSULIN ASPART 100 UNIT/ML ~~LOC~~ SOLN
0.0000 [IU] | SUBCUTANEOUS | Status: DC
  Administered 2019-02-19: 21:00:00 3 [IU] via SUBCUTANEOUS
  Administered 2019-02-19: 17:00:00 5 [IU] via SUBCUTANEOUS
  Administered 2019-02-19 – 2019-02-20 (×2): 1 [IU] via SUBCUTANEOUS
  Administered 2019-02-20: 21:00:00 7 [IU] via SUBCUTANEOUS
  Administered 2019-02-20: 05:00:00 1 [IU] via SUBCUTANEOUS
  Administered 2019-02-20 (×2): 2 [IU] via SUBCUTANEOUS
  Administered 2019-02-21: 23:00:00 7 [IU] via SUBCUTANEOUS
  Administered 2019-02-21: 13:00:00 2 [IU] via SUBCUTANEOUS
  Administered 2019-02-21: 09:00:00 3 [IU] via SUBCUTANEOUS
  Administered 2019-02-21: 5 [IU] via SUBCUTANEOUS
  Administered 2019-02-21 – 2019-02-22 (×2): 2 [IU] via SUBCUTANEOUS
  Administered 2019-02-22: 1 [IU] via SUBCUTANEOUS
  Administered 2019-02-22: 05:00:00 7 [IU] via SUBCUTANEOUS
  Administered 2019-02-22: 18:00:00 2 [IU] via SUBCUTANEOUS
  Administered 2019-02-23: 01:00:00 5 [IU] via SUBCUTANEOUS
  Administered 2019-02-23: 09:00:00 2 [IU] via SUBCUTANEOUS
  Administered 2019-02-23: 12:00:00 9 [IU] via SUBCUTANEOUS
  Administered 2019-02-23: 05:00:00 3 [IU] via SUBCUTANEOUS

## 2019-02-19 MED ORDER — HYDROMORPHONE HCL 1 MG/ML IJ SOLN
1.0000 mg | Freq: Once | INTRAMUSCULAR | Status: AC
  Administered 2019-02-19: 08:00:00 1 mg via INTRAVENOUS
  Filled 2019-02-19: qty 1

## 2019-02-19 MED ORDER — ALBUTEROL SULFATE (2.5 MG/3ML) 0.083% IN NEBU: 2.5000 mg | INHALATION_SOLUTION | Freq: Four times a day (QID) | RESPIRATORY_TRACT | Status: DC | PRN

## 2019-02-19 MED ORDER — SODIUM CHLORIDE 0.9% FLUSH
10.0000 mL | Freq: Two times a day (BID) | INTRAVENOUS | Status: DC
  Administered 2019-02-20 – 2019-02-23 (×2): 10 mL

## 2019-02-19 MED ORDER — INSULIN GLARGINE 100 UNIT/ML ~~LOC~~ SOLN
10.0000 [IU] | Freq: Every day | SUBCUTANEOUS | Status: DC
  Administered 2019-02-19 – 2019-02-23 (×5): 10 [IU] via SUBCUTANEOUS
  Filled 2019-02-19 (×5): qty 0.1

## 2019-02-19 MED ORDER — OXYCODONE HCL 5 MG PO TABS
5.0000 mg | ORAL_TABLET | Freq: Four times a day (QID) | ORAL | Status: DC | PRN
  Administered 2019-02-19 – 2019-02-21 (×3): 5 mg via ORAL
  Filled 2019-02-19 (×6): qty 1

## 2019-02-19 MED ORDER — METOCLOPRAMIDE HCL 5 MG/ML IJ SOLN
10.0000 mg | Freq: Three times a day (TID) | INTRAMUSCULAR | Status: DC
  Administered 2019-02-19 – 2019-02-23 (×13): 10 mg via INTRAVENOUS
  Filled 2019-02-19 (×13): qty 2

## 2019-02-19 NOTE — Progress Notes (Signed)
PROGRESS NOTE    [name]  [id number] DOB: October 24, 1990 DOA: 02/18/2019 PCP: Vicenta Aly, FNP   Brief Narrative: 58 YOF with significant medical history with T1DM on insulin pump, GERD, asthma, diabetic gastroparesis/intractable cyclical vomiting, hypertension, history of previous DKA, pancreatitis presented again with similar episode of intractable nausea and vomiting and abdominal pain.  She has had multiple ER abuse and multiple admissions including ED visits yesterday ago and recent admission to [facility].She had 15 ED visits in the last 6 months to [facility] health system.  She was recently admitted to [place] on Jan 21 and was discharged on Jan 26.  She went to [facility] because she had to wait for long time in the emergency department here. In the ER found to have intractable nausea vomiting no DKA placed on IV fluids antiemetics and admitted for further management.  Subjective:  Multiple episodes of vomiting this am, pain not controlled by morphine iv Got dilaudid- feels some better. No dysurea, fever. Has generalized abd pain like before  Assessment & Plan:   Intractable nausea and vomiting/diabetic gastroparesis/esophagitis/abdominal pain: Has had multiple EGDs and multiple admissions for the same.  Followed by Dr. Paulita Fujita from GI, endocrine at [facility] and missed recent appointments because of being in ED.  This morning multiple vomiting episodes unable to tolerate p.o.  Patient recently responded well to IV Reglan, IV erythromycin at [facility].  Will start on IV Reglan 10 mg 3 times daily, erythromycin IV 20 mg 3 times daily, patient was on p.o. erythromycin at home supposed to be for 3 weeks from discharge.  Continue Phenergan, PPI, IV morphine and oxycodone as needed for pain control.  Continue IV fluids, advance diet as tolerated to FLD.  Uncontrolled type 1 diabetes mellitus on insulin pump/ history of prior DKA.  Patient A1c 7.8 1/30-stable. Blood sugar fairly stable.  She is not  eating well, hypoglycemia 31 last night, will cut down the Lantus to 10 units from 30 and continue sliding scale insulin q4h. patient missed her appointment with endocrinologist Dr. Oren Binet scheduled on 02/16/2019.?  Compliance issues Recent Labs  Lab 02/17/19 2146 02/17/19 2211 02/17/19 2351 02/19/19 0024 02/19/19 0809  GLUCAP 31* 180* 97 170* 83   Hypertension: BP stable on home lisinopril.  Asthma not in exacerbation.  Abnormal UA with urine leukocytes RBC, likely with neurogenic bladder and CIC q8 hr chronic finding no dysuria or fever.  No indication for antibiotics.  Anemia, on ferrous sulfate daily at home will hold for now, previous iron studies anemia from iron deficiency and chronic disease,B12 was normal.-Hemoglobin has dropped likely from IV fluid hydration.  Monitor closely, if further worsening will consult GI transfuse if less than 7 g.  Change Protonix to IV for now.  Baseline hemoglobin around 9 g. Recent Labs  Lab 02/16/19 1342 02/17/19 2100 02/18/19 1246 02/19/19 0556  HGB 9.6* 9.7* 9.7* 7.5*  HCT 31.4* 32.0* 31.4* 25.5*   Neurogenic bladder, on last discharge from date patient was placed on CIC every 8 hours whether or not she has voided - she has follow up arranged with outpatient urology 02/22/19 at 8:00 AM with Dr. Matilde Sprang [facility] [place]).  Will order bladder scan and as needed and out cath Abnormal UA with urine leukocytes RBC, likely with neurogenic bladder and CIC q8 hr chronic finding no dysuria or fever.  No indication for antibiotics.  Nutrition: CLD-ADAT to FLD as tolerated.  There is no height or weight on file to calculate BMI.   DVT  prophylaxis: lovenox Code Status:Full Family Communication: plan of care discussed with patient at bedside. Disposition Plan: Patient unable to tolerate diet, she will need at least 2 midnight stay will change her inpatient status due to inability to tolerate diet and for ongoing need of IV fluid hydration,  pain control with IV opiates.    Consultants: none Procedures:see note Microbiology: Antimicrobials: Anti-infectives (From admission, onward)   Start     Dose/Rate Route Frequency Ordered Stop   02/19/19 0900  erythromycin 250 mg in sodium chloride 0.9 % 100 mL IVPB     250 mg 100 mL/hr over 60 Minutes Intravenous Every 8 hours 02/19/19 0814 02/24/19 0859     Medications: Scheduled Meds: . Chlorhexidine Gluconate Cloth  6 each Topical Daily  . insulin aspart  0-9 Units Subcutaneous Q4H  . insulin glargine  10 Units Subcutaneous Daily  . lisinopril  2.5 mg Oral Daily  . metoCLOPramide (REGLAN) injection  10 mg Intravenous TID  . pantoprazole  40 mg Oral Daily  . sodium chloride flush  10-40 mL Intracatheter Q12H  . traZODone  50 mg Oral QHS   Continuous Infusions: . sodium chloride 100 mL/hr at 02/18/19 1803  . erythromycin      Objective: Vitals:   02/18/19 1700 02/18/19 1817 02/18/19 2027 02/19/19 0544  BP: (!) 126/109 127/79 107/74 109/65  Pulse: (!) 109 81 86 82  Resp:  18 14 16   Temp:  98.2 F (36.8 C) 99.1 F (37.3 C) 98.7 F (37.1 C)  TempSrc:  Oral Oral Oral  SpO2: 100% (!) 87% 100% 99%    Intake/Output Summary (Last 24 hours) at 02/19/2019 1048 Last data filed at 02/19/2019 0300 Gross per 24 hour  Intake 800 ml  Output --  Net 800 ml   There were no vitals filed for this visit. Weight change:   There is no height or weight on file to calculate BMI.  Intake/Output from previous day: 01/30 0701 - 01/31 0700 In: 800 [I.V.:800] Out: -  Intake/Output this shift: No intake/output data recorded.  Examination:  General exam: AAOx3,NAD, Weak appearing  older than stated age. HEENT:Oral mucosa moist, Ear/Nose WNL grossly, dentition normal. Respiratory system: Bilaterally clear breath sounds, no wheezing or crackles,no use of accessory muscle Cardiovascular system: S1 & S2 +, No JVD,. Gastrointestinal system: Abdomen soft, no significant tenderness,  BS+ Nervous System:Alert, awake, moving extremities and grossly nonfocal Extremities: No edema, distal peripheral pulses palpable.  Skin: No rashes,no icterus. MSK: Normal muscle bulk,tone, power   Data Reviewed: I have personally reviewed following labs and imaging studies  CBC: Recent Labs  Lab 02/16/19 1342 02/17/19 2100 02/18/19 1246 02/19/19 0556  WBC 10.1 8.4 8.1 6.5  NEUTROABS 7.7  --  6.3  --   HGB 9.6* 9.7* 9.7* 7.5*  HCT 31.4* 32.0* 31.4* 25.5*  MCV 84.4 83.3 83.1 85.6  PLT 473* 489* 464* [id number]   Basic Metabolic Panel: Recent Labs  Lab 02/16/19 1342 02/17/19 2100 02/18/19 1246 02/19/19 0556  NA 140 139 136 136  K 3.8 3.8 3.8 3.9  CL 104 103 99 105  CO2 29 28 26 26   GLUCOSE 151* 84 306* 213*  BUN 20 18 17 11   CREATININE 0.69 0.68 0.75 0.56  CALCIUM 9.3 9.5 9.1 8.2*   GFR: Estimated Creatinine Clearance: 89.1 mL/min (by C-G formula based on SCr of 0.56 mg/dL). Liver Function Tests: Recent Labs  Lab 02/16/19 1342 02/17/19 2100 02/18/19 1246  AST 16 14* 15  ALT 10 10  10  ALKPHOS 69 72 67  BILITOT 0.7 0.8 0.7  PROT 7.2 7.4 6.8  ALBUMIN 4.0 4.3 3.8   Recent Labs  Lab 02/16/19 1342 02/17/19 2100 02/18/19 1246  LIPASE 15 14 13    No results for input(s): AMMONIA in the last 168 hours. Coagulation Profile: No results for input(s): INR, PROTIME in the last 168 hours. Cardiac Enzymes: No results for input(s): CKTOTAL, CKMB, CKMBINDEX, TROPONINI in the last 168 hours. BNP (last 3 results) No results for input(s): PROBNP in the last 8760 hours. HbA1C: Recent Labs    02/18/19 1240  HGBA1C 7.8*   CBG: Recent Labs  Lab 02/17/19 2146 02/17/19 2211 02/17/19 2351 02/19/19 0024 02/19/19 0809  GLUCAP 31* 180* 97 170* 83   Lipid Profile: No results for input(s): CHOL, HDL, LDLCALC, TRIG, CHOLHDL, LDLDIRECT in the last 72 hours. Thyroid Function Tests: No results for input(s): TSH, T4TOTAL, FREET4, T3FREE, THYROIDAB in the last 72 hours. Anemia  Panel: No results for input(s): VITAMINB12, FOLATE, FERRITIN, TIBC, IRON, RETICCTPCT in the last 72 hours. Sepsis Labs: No results for input(s): PROCALCITON, LATICACIDVEN in the last 168 hours.  Recent Results (from the past 240 hour(s))  SARS CORONAVIRUS 2 (TAT 6-24 HRS) Nasopharyngeal Nasopharyngeal Swab     Status: None   Collection Time: 02/18/19  3:00 PM   Specimen: Nasopharyngeal Swab  Result Value Ref Range Status   SARS Coronavirus 2 NEGATIVE NEGATIVE Final    Comment: (NOTE) SARS-CoV-2 target nucleic acids are NOT DETECTED. The SARS-CoV-2 RNA is generally detectable in upper and lower respiratory specimens during the acute phase of infection. Negative results do not preclude SARS-CoV-2 infection, do not rule out co-infections with other pathogens, and should not be used as the sole basis for treatment or other patient management decisions. Negative results must be combined with clinical observations, patient history, and epidemiological information. The expected result is Negative. Fact Sheet for Patients: SugarRoll.be Fact Sheet for Healthcare Providers: https://www.woods-mathews.com/ This test is not yet approved or cleared by the [place] FDA and  has been authorized for detection and/or diagnosis of SARS-CoV-2 by FDA under an Emergency Use Authorization (EUA). This EUA will remain  in effect (meaning this test can be used) for the duration of the COVID-19 declaration under Section 56 4(b)(1) of the Act, 21 U.S.C. section 360bbb-3(b)(1), unless the authorization is terminated or revoked sooner. Performed at [facility], [place] [address], [place], [place] [address]       Radiology Studies: CT ABDOMEN PELVIS W CONTRAST  Result Date: 02/18/2019 CLINICAL DATA:  Severe diffuse abdominal pain, nausea, and vomiting for 3 days. EXAM: CT ABDOMEN AND PELVIS WITH CONTRAST TECHNIQUE: Multidetector CT imaging of the abdomen and  pelvis was performed using the standard protocol following bolus administration of intravenous contrast. CONTRAST:  130mL OMNIPAQUE IOHEXOL 300 MG/ML  SOLN COMPARISON:  12/28/2018 FINDINGS: Lower Chest: No acute findings. Hepatobiliary: No hepatic masses identified. Prior cholecystectomy. No evidence of biliary obstruction. Pancreas:  No mass or inflammatory changes. Spleen: Within normal limits in size and appearance. Adrenals/Urinary Tract: No masses identified. No evidence of hydronephrosis. Areas of decreased enhancement in the right kidney decreased since previous study, consistent with resolving pyelonephritis. Previously seen right hydroureteronephrosis has resolved. Diffuse bladder wall thickening and mucosal enhancement show mild improvement, consistent with improving cystitis. Stomach/Bowel: Persistent wall thickening distal thoracic esophagus, consistent with esophagitis. No evidence of hiatal hernia. No evidence of bowel obstruction or wall thickening, or abnormal fluid collections. Vascular/Lymphatic: No pathologically enlarged lymph  nodes. No abdominal aortic aneurysm. Reproductive: 1.7 cm posterior uterine fibroid noted. Adnexal regions are unremarkable. Other:  None. Musculoskeletal:  No suspicious bone lesions identified. IMPRESSION: 1. No acute findings. 2. Improving right pyelonephritis and resolution of right hydroureteronephrosis since previous study. 3. Interval decrease in diffuse bladder wall thickening, consistent with improving cystitis. 4. Stable wall thickening of distal thoracic esophagus, consistent with esophagitis. 5. 1.7 cm posterior uterine fibroid. Electronically Signed   By: Marlaine Hind M.D.   On: 02/18/2019 14:17      LOS: 0 days   Time spent: More than 50% of that time was spent in counseling and/or coordination of care.  Antonieta Pert, MD [facility]  02/19/2019, 10:48 AM

## 2019-02-20 ENCOUNTER — Encounter (HOSPITAL_COMMUNITY): Payer: Self-pay | Admitting: Internal Medicine

## 2019-02-20 LAB — IRON AND TIBC
Iron: 18 ug/dL — ABNORMAL LOW (ref 28–170)
Saturation Ratios: 6 % — ABNORMAL LOW (ref 10.4–31.8)
TIBC: 324 ug/dL (ref 250–450)
UIBC: 306 ug/dL

## 2019-02-20 LAB — BASIC METABOLIC PANEL
Anion gap: 6 (ref 5–15)
BUN: 10 mg/dL (ref 6–20)
CO2: 24 mmol/L (ref 22–32)
Calcium: 8.2 mg/dL — ABNORMAL LOW (ref 8.9–10.3)
Chloride: 108 mmol/L (ref 98–111)
Creatinine, Ser: 0.66 mg/dL (ref 0.44–1.00)
GFR calc Af Amer: >60 mL/min (ref >60)
GFR calc non Af Amer: >60 mL/min (ref >60)
Glucose, Bld: 150 mg/dL — ABNORMAL HIGH (ref 70–99)
Potassium: 3.8 mmol/L (ref 3.5–5.1)
Sodium: 138 mmol/L (ref 135–145)

## 2019-02-20 LAB — URINE CULTURE: Culture: <10000 — AB

## 2019-02-20 LAB — GLUCOSE, CAPILLARY
Glucose-Capillary: 117 mg/dL — ABNORMAL HIGH (ref 70–99)
Glucose-Capillary: 125 mg/dL — ABNORMAL HIGH (ref 70–99)
Glucose-Capillary: 142 mg/dL — ABNORMAL HIGH (ref 70–99)
Glucose-Capillary: 164 mg/dL — ABNORMAL HIGH (ref 70–99)
Glucose-Capillary: 286 mg/dL — ABNORMAL HIGH (ref 70–99)
Glucose-Capillary: 335 mg/dL — ABNORMAL HIGH (ref 70–99)

## 2019-02-20 LAB — CBC
HCT: 24.5 % — ABNORMAL LOW (ref 36.0–46.0)
Hemoglobin: 7.4 g/dL — ABNORMAL LOW (ref 12.0–15.0)
MCH: 25.4 pg — ABNORMAL LOW (ref 26.0–34.0)
MCHC: 30.2 g/dL (ref 30.0–36.0)
MCV: 84.2 fL (ref 80.0–100.0)
Platelets: 334 10*3/uL (ref 150–400)
RBC: 2.91 MIL/uL — ABNORMAL LOW (ref 3.87–5.11)
RDW: 15.3 % (ref 11.5–15.5)
WBC: 7.3 10*3/uL (ref 4.0–10.5)
nRBC: 0 % (ref 0.0–0.2)

## 2019-02-20 LAB — RETICULOCYTES
Immature Retic Fract: 15.2 % (ref 2.3–15.9)
RBC.: 2.93 MIL/uL — ABNORMAL LOW (ref 3.87–5.11)
Retic Count, Absolute: 36 10*3/uL (ref 19.0–186.0)
Retic Ct Pct: 1.2 % (ref 0.4–3.1)

## 2019-02-20 LAB — VITAMIN B12: Vitamin B-12: 272 pg/mL (ref 180–914)

## 2019-02-20 LAB — FERRITIN: Ferritin: 7 ng/mL — ABNORMAL LOW (ref 11–307)

## 2019-02-20 LAB — FOLATE: Folate: 8.7 ng/mL (ref >5.9)

## 2019-02-20 MED ORDER — GLUCERNA SHAKE PO LIQD
237.0000 mL | Freq: Three times a day (TID) | ORAL | Status: DC
  Filled 2019-02-20 (×10): qty 237

## 2019-02-20 MED ORDER — ADULT MULTIVITAMIN W/MINERALS CH
1.0000 | ORAL_TABLET | Freq: Every day | ORAL | Status: DC
  Administered 2019-02-20 – 2019-02-23 (×4): 1 via ORAL
  Filled 2019-02-20 (×4): qty 1

## 2019-02-20 MED ORDER — HYDROMORPHONE HCL 1 MG/ML IJ SOLN
0.5000 mg | INTRAMUSCULAR | Status: DC | PRN
  Administered 2019-02-20 – 2019-02-23 (×14): 0.5 mg via INTRAVENOUS
  Filled 2019-02-20 (×14): qty 0.5

## 2019-02-20 NOTE — Progress Notes (Addendum)
Initial Nutrition Assessment  INTERVENTION:   -Glucerna Shake po TID, each supplement provides 220 kcal and 10 grams of protein -Multivitamin with minerals daily  NUTRITION DIAGNOSIS:   Inadequate oral intake related to nausea, vomiting(gastroparesis) as evidenced by per patient/family report.  GOAL:   Patient will meet greater than or equal to 90% of their needs  MONITOR:   PO intake, Supplement acceptance, Weight trends, Labs, I & O's  REASON FOR ASSESSMENT:   Malnutrition Screening Tool    ASSESSMENT:   38 YOF with significant medical history with T1DM on insulin pump, GERD, asthma, diabetic gastroparesis/intractable cyclical vomiting, hypertension, history of previous DKA, pancreatitis presented again with similar episode of intractable nausea and vomiting and abdominal pain.  She has had multiple ER abuse and multiple admissions including ED visits yesterday ago and recent admission to [facility].She had 15 ED visits in the last 6 months to [facility] health system.  She was recently admitted to [facility] on Jan 21 and was discharged on Jan 26.  She went to [facility] because she had to wait for long time in the emergency department here.In the ER found to have intractable nausea vomiting no DKA placed on IV fluids antiemetics and admitted for further management.  **RD working remotely**  Patient reports N/V and abdominal pain for 3 days PTA. Pt was unable to tolerate PO intakes during this time.  Pt has had recurrent history of gastroparesis. Has been seen by nutrition team multiple times in the past for gastroparesis and diet education needs.  Pt now on soft diet. Will order Glucerna shakes for additional kcals and protein.  Per weight records, pt has lost 12 lbs since 10/19 (9% wt loss x 3.5 months, significant for time frame).  I/Os: +160 ml since admit UOP: 500 ml x 24 hrs  Medications: IV Reglan, IV Phenergan  Labs reviewed: CBGs: 117-125   NUTRITION - FOCUSED PHYSICAL  EXAM:  Working remotely.  Diet Order:   Diet Order            DIET SOFT Room service appropriate? Yes; Fluid consistency: Thin  Diet effective now              EDUCATION NEEDS:   No education needs have been identified at this time  Skin:  Skin Assessment: Reviewed RN Assessment  Last BM:  1/30  Height:   Ht Readings from Last 1 Encounters:  02/20/19 5' 4.02" (1.626 m)    Weight:   Wt Readings from Last 1 Encounters:  02/20/19 54.4 kg    Ideal Body Weight:  54.5 kg  BMI:  Body mass index is 20.59 kg/m.  Estimated Nutritional Needs:   Kcal:  1500-1700  Protein:  70-80g  Fluid:  1.6L/day   Clayton Bibles, MS, RD, LDN Inpatient Clinical Dietitian Pager: [phone] After Hours Pager: [phone]

## 2019-02-20 NOTE — TOC Initial Note (Signed)
Transition of Care [facility]) - Initial/Assessment Note    Patient Details  Name: [name] MRN: [id number] Date of Birth: March 12, 1990  Transition of [facility]) CM/SW Contact:    Trish Mage, LCSW Phone Number: 02/20/2019, 3:28 PM  Clinical Narrative:   Patient with intractable nausea and vomiting is seen due to high risk of readmission.  See my note of 11/17/2018 for more extensive details.   Her living situation is the same, her daughter, who is now 24, is attending classes in person at school, and she is still employed at [employer] despite her inability to string together a full week of work days at one time.  She identified no needs. TOC will continue to follow during the course of hospitalization.              Expected Discharge Plan: Home/Self Care Barriers to Discharge: No Barriers Identified   Patient Goals and CMS Choice Patient states their goals for this hospitalization and ongoing recovery are:: "Just hanging in there"      Expected Discharge Plan and Services Expected Discharge Plan: Home/Self Care   Discharge Planning Services: CM Consult   Living arrangements for the past 2 months: Single Family Home                                      Prior Living Arrangements/Services Living arrangements for the past 2 months: Single Family Home Lives with:: Minor Children, Parents Patient language and need for interpreter reviewed:: Yes Do you feel safe going back to the place where you live?: Yes      Need for Family Participation in Patient Care: Yes (Comment) Care giver support system in place?: Yes (comment)   Criminal Activity/Legal Involvement Pertinent to Current Situation/Hospitalization: No - Comment as needed  Activities of Daily Living Home Assistive Devices/Equipment: None ADL Screening (condition at time of admission) Patient's cognitive ability adequate to safely complete daily activities?: Yes Is the patient deaf or have  difficulty hearing?: No Does the patient have difficulty seeing, even when wearing glasses/contacts?: No Does the patient have difficulty concentrating, remembering, or making decisions?: No Patient able to express need for assistance with ADLs?: Yes Does the patient have difficulty dressing or bathing?: No Independently performs ADLs?: Yes (appropriate for developmental age) Does the patient have difficulty walking or climbing stairs?: No Weakness of Legs: None Weakness of Arms/Hands: None  Permission Sought/Granted                  Emotional Assessment Appearance:: Appears stated age Attitude/Demeanor/Rapport: Engaged Affect (typically observed): Appropriate Orientation: : Oriented to Self, Oriented to Place, Oriented to  Time, Oriented to Situation Alcohol / Substance Use: Not Applicable Psych Involvement: No (comment)  Admission diagnosis:  Intractable nausea and vomiting [R11.2] Abdominal pain, unspecified abdominal location [R10.9] Intractable vomiting with nausea, unspecified vomiting type [R11.2] Gastroparesis [K31.84] Patient Active Problem List   Diagnosis Date Noted  . Gastroparesis due to secondary diabetes ([place]) 11/21/2018  . Diabetes mellitus ([place]) 11/13/2018  . HTN (hypertension) 11/13/2018  . Marijuana abuse 11/13/2018  . Acute pyelonephritis 10/07/2018  . Hydronephrosis of right kidney 10/05/2018  . Diffuse pain   . Elevated blood pressure reading 12/03/2017  . Tachycardia 11/30/2017  . Hypernatremia 11/02/2017  . Nausea and vomiting 01/22/2017  . Nausea & vomiting 01/21/2017  . Intractable cyclical vomiting syndrome 12/18/2016  . Leukocytosis 10/24/2016  . N&V (nausea  and vomiting) 10/23/2016  . DKA, type 1 ([place]) 09/26/2016  . Thrombocytosis ([place]) 07/08/2016  . Candiduria, asymptomatic 06/23/2016  . Hyponatremia 06/13/2016  . Hematuria 05/13/2016  . UTI (urinary tract infection) 01/22/2016  . Diarrhea 11/09/2015  . Acute urinary retention   .  Gastroparesis due to DM ([place]) 07/10/2015  . GERD (gastroesophageal reflux disease) 07/10/2015  . Depression with anxiety 07/10/2015  . Gastroparesis 06/22/2015  . Altered mental status 06/22/2015  . Volume depletion 06/10/2015  . Protein-calorie malnutrition, severe 06/10/2015  . Hyperglycemia   . Elevated bilirubin   . Hematemesis with nausea   . Intractable nausea and vomiting 05/28/2015  . Abdominal pain in female   . Abdominal pain 05/24/2015  . Hypertension 05/24/2015  . Dehydration   . Diabetic gastroparesis ([place])   . Chronic diastolic heart failure ([place]) 04/11/2015  . Hematemesis 04/08/2015  . DKA (diabetic ketoacidoses) ([place]) 03/22/2015  . S/P laparoscopic cholecystectomy 02/11/2015  . Postextubation stridor   . Pancreatitis, acute 11/26/2014  . Volume overload 11/26/2014  . Hypokalemia 11/26/2014  . Hepatic steatosis 11/26/2014  . Liver mass 11/26/2014  . Sepsis ([place]) 11/25/2014  . Sinus tachycardia 11/25/2014  . Hypomagnesemia 11/25/2014  . Hypophosphatemia 11/25/2014  . Elevated LFTs 11/24/2014  . AKI (acute kidney injury) ([place]) 11/24/2014  . Migraine headache 11/24/2014  . Asthma 06/29/2012  . Uncontrolled type 1 diabetes mellitus ([place]) 06/19/2010  . Goiter, unspecified 06/19/2010   PCP:  Vicenta Aly, [place] Pharmacy:   [id number] [place], [address] [place] [address] Phone: [phone] Fax: [phone]     Social Determinants of Health (SDOH) Interventions    Readmission Risk Interventions Readmission Risk Prevention Plan 12/26/2018 11/14/2018  Transportation Screening Complete Complete  Medication Review Press photographer) Complete Complete  PCP or Specialist appointment within 3-5 days of discharge Complete Not Complete  PCP/Specialist Appt Not Complete comments - not ready for dc  HRI or [place] Not Complete Complete  HRI or Home Care Consult Pt Refusal Comments n/a -  SW  Recovery Care/Counseling Consult Not Complete Complete  SW Consult Not Complete Comments pending need -  Palliative Care Screening Not Complete Not Applicable  Comments pending need -  [place] Not Applicable Not Applicable  Some recent data might be hidden

## 2019-02-20 NOTE — Progress Notes (Signed)
Pt bladder scanned resulting in 549 mls. Retained. In and out performed  urine output 800 ml's, pt tolerated well.

## 2019-02-20 NOTE — Progress Notes (Signed)
PROGRESS NOTE    [name]  [id number] DOB: [birth year] DOA: 02/18/2019 PCP: Vicenta Aly, FNP   Brief Narrative: 49 YOF with significant medical history with T1DM on insulin pump, GERD, asthma, diabetic gastroparesis/intractable cyclical vomiting, hypertension, history of previous DKA, pancreatitis presented again with similar episode of intractable nausea and vomiting and abdominal pain.  She has had multiple ER abuse and multiple admissions including ED visits yesterday ago and recent admission to [facility].She had 15 ED visits in the last 6 months to [facility] system.  She was recently admitted to [facility] on Jan 21 and was discharged on Jan 26.  She went to [facility] because she had to wait for long time in the emergency department here. In the ER found to have intractable nausea vomiting no DKA placed on IV fluids antiemetics and admitted for further management.  Subjective: Vomited x2 this morning.  Asking for pain medication reports and Dilaudid is what works best for her and morphine is not helping.  Continues to need in and out catheterization. Has been ambulating. Diet was advanced to soft diet yesterday.  Assessment & Plan:   Intractable nausea and vomiting/diabetic gastroparesis/esophagitis/abdominal pain: Has had multiple EGDs and multiple admissions for the same.  Followed by Dr. Paulita Fujita from GI, endocrine at [facility] and missed recent appointments because of being in ED. vomited this morning and also having ongoing pain, switch morphine to IV Dilaudid, continue. Patient recently responded well to IV Reglan, IV erythromycin at [facility].  Cont IV Reglan 10 mg 3 times daily, erythromycin IV 250 mg 3 times daily, patient was on p.o. erythromycin at home supposed to be for 3 weeks from discharge.  Continue Phenergan, PPI. Continue IV fluids, continue soft diet.    Uncontrolled type 1 diabetes mellitus on insulin pump/ history of prior DKA.  Patient A1c 7.8 1/30-stable. Blood sugar  fairly stable.  She is not eating well, hypoglycemia 31 last night, we have cut down the Lantus to 10 units from 30 and continue sliding scale insulin q4h. patient missed her appointment with endocrinologist Dr. Oren Binet scheduled on 02/16/2019.?  Compliance issues. blood sugar remains a stable.. Recent Labs  Lab 02/19/19 1624 02/19/19 1957 02/19/19 2353 02/20/19 0458 02/20/19 0803  GLUCAP 266* 209* 170* 142* 125*   Hypertension: BP stable on home lisinopril.  Asthma not in exacerbation.  Abnormal UA with urine leukocytes RBC, likely with neurogenic bladder and CIC q8 hr chronic finding no dysuria or fever.No indication for antibiotics.  Anemia, on ferrous sulfate daily at home will hold for now, previous iron studies anemia from iron deficiency and chronic disease,B12 was normal.-Hemoglobin has dropped likely from IV fluid hydration.  Monitor closely, if further worsening will consult GI transfuse if less than 7 g.  Change Protonix to IV for now.  Baseline hemoglobin around 9 g.  Holding at mid 7 g range.  Monitor Recent Labs  Lab 02/16/19 1342 02/17/19 2100 02/18/19 1246 02/19/19 0556 02/20/19 0436  HGB 9.6* 9.7* 9.7* 7.5* 7.4*  HCT 31.4* 32.0* 31.4* 25.5* 24.5*   Neurogenic bladder, on last discharge from date patient was placed on CIC every 8 hours whether or not she has voided - she has follow up arranged with outpatient urology 02/22/19 at 8:00 AM with Dr. Matilde Sprang [facility], [place]).  Continue on 3 times daily bladder scan and I/o  Cath.  Abnormal UA with urine leukocytes RBC, likely with neurogenic bladder and CIC q8 hr chronic finding no dysuria or fever.No indication for antibiotics.  Nutrition: CLD-ADAT to FLD as tolerated.  Body mass index is 20.59 kg/m.   DVT prophylaxis: lovenox Code Status:Full Family Communication: plan of care discussed with patient at bedside. Disposition Plan: Home once tolerates diet and no more episodes of vomiting.   Consultants:  none Procedures:see note Microbiology: Antimicrobials: Anti-infectives (From admission, onward)   Start     Dose/Rate Route Frequency Ordered Stop   02/19/19 0900  erythromycin 250 mg in sodium chloride 0.9 % 100 mL IVPB     250 mg 100 mL/hr over 60 Minutes Intravenous Every 8 hours 02/19/19 0814 02/24/19 0859     Medications: Scheduled Meds: . Chlorhexidine Gluconate Cloth  6 each Topical Daily  . insulin aspart  0-9 Units Subcutaneous Q4H  . insulin glargine  10 Units Subcutaneous Daily  . lisinopril  2.5 mg Oral Daily  . metoCLOPramide (REGLAN) injection  10 mg Intravenous TID  . pantoprazole  40 mg Oral Daily  . sodium chloride flush  10-40 mL Intracatheter Q12H  . traZODone  50 mg Oral QHS   Continuous Infusions: . sodium chloride 100 mL/hr at 02/20/19 1100  . erythromycin 250 mg (02/20/19 0909)    Objective: Vitals:   02/19/19 1329 02/19/19 1958 02/20/19 0455 02/20/19 0946  BP: 110/82 119/78 117/74 117/74  Pulse: (!) 101 96 82 82  Resp: 16 14 16 16   Temp: 99.1 F (37.3 C) 99.5 F (37.5 C) 98 F (36.7 C) 98 F (36.7 C)  TempSrc: Oral Oral Oral Oral  SpO2: 98% 100% 100%   Weight:    54.4 kg  Height:    5' 4.02" (1.626 m)    Intake/Output Summary (Last 24 hours) at 02/20/2019 1122 Last data filed at 02/20/2019 0900 Gross per 24 hour  Intake 460 ml  Output 1100 ml  Net -640 ml   Filed Weights   02/20/19 0946  Weight: 54.4 kg   Weight change:   Body mass index is 20.59 kg/m.  Intake/Output from previous day: 01/31 0701 - 02/01 0700 In: 28 [P.O.:360; IV Piggyback:100] Out: 600 [Urine:600] Intake/Output this shift: Total I/O In: -  Out: 500 [Urine:500]  Examination:  General exam: AAOx3,NAD, older than stated age, weak and frail.   HEENT:Oral mucosa moist, Ear/Nose WNL grossly, dentition normal. Respiratory system: Bilaterally clear breath sounds, no wheezing or crackles,no use of accessory muscle Cardiovascular system: S1 & S2 +, No  JVD,. Gastrointestinal system: Abdomen soft, nonspecific generalized tenderness,BS+ Nervous System:Alert, awake, moving extremities and grossly nonfocal Extremities: No edema, distal peripheral pulses palpable.  Skin: No rashes,no icterus. MSK: Normal muscle bulk,tone, power   Data Reviewed: I have personally reviewed following labs and imaging studies  CBC: Recent Labs  Lab 02/16/19 1342 02/17/19 2100 02/18/19 1246 02/19/19 0556 02/20/19 0436  WBC 10.1 8.4 8.1 6.5 7.3  NEUTROABS 7.7  --  6.3  --   --   HGB 9.6* 9.7* 9.7* 7.5* 7.4*  HCT 31.4* 32.0* 31.4* 25.5* 24.5*  MCV 84.4 83.3 83.1 85.6 84.2  PLT 473* 489* 464* 350 [id number]   Basic Metabolic Panel: Recent Labs  Lab 02/16/19 1342 02/17/19 2100 02/18/19 1246 02/19/19 0556 02/20/19 0436  NA 140 139 136 136 138  K 3.8 3.8 3.8 3.9 3.8  CL 104 103 99 105 108  CO2 29 28 26 26 24   GLUCOSE 151* 84 306* 213* 150*  BUN 20 18 17 11 10   CREATININE 0.69 0.68 0.75 0.56 0.66  CALCIUM 9.3 9.5 9.1 8.2* 8.2*   GFR: Estimated Creatinine  Clearance: 89.1 mL/min (by C-G formula based on SCr of 0.66 mg/dL). Liver Function Tests: Recent Labs  Lab 02/16/19 1342 02/17/19 2100 02/18/19 1246  AST 16 14* 15  ALT 10 10 10   ALKPHOS 69 72 67  BILITOT 0.7 0.8 0.7  PROT 7.2 7.4 6.8  ALBUMIN 4.0 4.3 3.8   Recent Labs  Lab 02/16/19 1342 02/17/19 2100 02/18/19 1246  LIPASE 15 14 13    No results for input(s): AMMONIA in the last 168 hours. Coagulation Profile: No results for input(s): INR, PROTIME in the last 168 hours. Cardiac Enzymes: No results for input(s): CKTOTAL, CKMB, CKMBINDEX, TROPONINI in the last 168 hours. BNP (last 3 results) No results for input(s): PROBNP in the last 8760 hours. HbA1C: Recent Labs    02/18/19 1240  HGBA1C 7.8*   CBG: Recent Labs  Lab 02/19/19 1624 02/19/19 1957 02/19/19 2353 02/20/19 0458 02/20/19 0803  GLUCAP 266* 209* 170* 142* 125*   Lipid Profile: No results for input(s): CHOL, HDL,  LDLCALC, TRIG, CHOLHDL, LDLDIRECT in the last 72 hours. Thyroid Function Tests: No results for input(s): TSH, T4TOTAL, FREET4, T3FREE, THYROIDAB in the last 72 hours. Anemia Panel: No results for input(s): VITAMINB12, FOLATE, FERRITIN, TIBC, IRON, RETICCTPCT in the last 72 hours. Sepsis Labs: No results for input(s): PROCALCITON, LATICACIDVEN in the last 168 hours.  Recent Results (from the past 240 hour(s))  Urine culture     Status: Abnormal   Collection Time: 02/18/19  2:23 PM   Specimen: Urine, Clean Catch  Result Value Ref Range Status   Specimen Description   Final    URINE, CLEAN CATCH Performed at [facility], [place] [address] [place], [place] [address]    Special Requests   Final    NONE Performed at [facility], [place] [address], [place], [place] [address]    Culture (A)  Final    <10,000 COLONIES/mL INSIGNIFICANT GROWTH Performed at [place] [address], [place], [place] [address]    Report Status 02/20/2019 FINAL  Final  SARS CORONAVIRUS 2 (TAT 6-24 HRS) Nasopharyngeal Nasopharyngeal Swab     Status: None   Collection Time: 02/18/19  3:00 PM   Specimen: Nasopharyngeal Swab  Result Value Ref Range Status   SARS Coronavirus 2 NEGATIVE NEGATIVE Final    Comment: (NOTE) SARS-CoV-2 target nucleic acids are NOT DETECTED. The SARS-CoV-2 RNA is generally detectable in upper and lower respiratory specimens during the acute phase of infection. Negative results do not preclude SARS-CoV-2 infection, do not rule out co-infections with other pathogens, and should not be used as the sole basis for treatment or other patient management decisions. Negative results must be combined with clinical observations, patient history, and epidemiological information. The expected result is Negative. Fact Sheet for Patients: SugarRoll.be Fact Sheet for Healthcare  Providers: https://www.woods-mathews.com/ This test is not yet approved or cleared by the [place] FDA and  has been authorized for detection and/or diagnosis of SARS-CoV-2 by FDA under an Emergency Use Authorization (EUA). This EUA will remain  in effect (meaning this test can be used) for the duration of the COVID-19 declaration under Section 56 4(b)(1) of the Act, 21 U.S.C. section 360bbb-3(b)(1), unless the authorization is terminated or revoked sooner. Performed at [facility], [place] [address], [place],  [address]       Radiology Studies: CT ABDOMEN PELVIS W CONTRAST  Result Date: 02/18/2019 CLINICAL DATA:  Severe diffuse abdominal pain, nausea, and vomiting for 3 days.  EXAM: CT ABDOMEN AND PELVIS WITH CONTRAST TECHNIQUE: Multidetector CT imaging of the abdomen and pelvis was performed using the standard protocol following bolus administration of intravenous contrast. CONTRAST:  165mL OMNIPAQUE IOHEXOL 300 MG/ML  SOLN COMPARISON:  12/28/2018 FINDINGS: Lower Chest: No acute findings. Hepatobiliary: No hepatic masses identified. Prior cholecystectomy. No evidence of biliary obstruction. Pancreas:  No mass or inflammatory changes. Spleen: Within normal limits in size and appearance. Adrenals/Urinary Tract: No masses identified. No evidence of hydronephrosis. Areas of decreased enhancement in the right kidney decreased since previous study, consistent with resolving pyelonephritis. Previously seen right hydroureteronephrosis has resolved. Diffuse bladder wall thickening and mucosal enhancement show mild improvement, consistent with improving cystitis. Stomach/Bowel: Persistent wall thickening distal thoracic esophagus, consistent with esophagitis. No evidence of hiatal hernia. No evidence of bowel obstruction or wall thickening, or abnormal fluid collections. Vascular/Lymphatic: No pathologically enlarged lymph nodes. No abdominal aortic aneurysm. Reproductive: 1.7 cm  posterior uterine fibroid noted. Adnexal regions are unremarkable. Other:  None. Musculoskeletal:  No suspicious bone lesions identified. IMPRESSION: 1. No acute findings. 2. Improving right pyelonephritis and resolution of right hydroureteronephrosis since previous study. 3. Interval decrease in diffuse bladder wall thickening, consistent with improving cystitis. 4. Stable wall thickening of distal thoracic esophagus, consistent with esophagitis. 5. 1.7 cm posterior uterine fibroid. Electronically Signed   By: Marlaine Hind M.D.   On: 02/18/2019 14:17      LOS: 1 day   Time spent: More than 50% of that time was spent in counseling and/or coordination of care.  Antonieta Pert, MD [facility]  02/20/2019, 11:22 AM

## 2019-02-21 LAB — GLUCOSE, CAPILLARY
Glucose-Capillary: 192 mg/dL — ABNORMAL HIGH (ref 70–99)
Glucose-Capillary: 237 mg/dL — ABNORMAL HIGH (ref 70–99)
Glucose-Capillary: 176 mg/dL — ABNORMAL HIGH (ref 70–99)
Glucose-Capillary: 193 mg/dL — ABNORMAL HIGH (ref 70–99)
Glucose-Capillary: 308 mg/dL — ABNORMAL HIGH (ref 70–99)

## 2019-02-21 MED ORDER — HYDROMORPHONE HCL 1 MG/ML IJ SOLN
0.2500 mg | Freq: Once | INTRAMUSCULAR | Status: AC
  Administered 2019-02-21: 10:00:00 0.25 mg via INTRAVENOUS
  Filled 2019-02-21: qty 0.5

## 2019-02-21 MED ORDER — OXYCODONE HCL 5 MG PO TABS
5.0000 mg | ORAL_TABLET | ORAL | Status: DC | PRN
  Administered 2019-02-21 – 2019-02-22 (×4): 5 mg via ORAL
  Filled 2019-02-21 (×4): qty 1

## 2019-02-21 NOTE — Progress Notes (Signed)
Midnight bladder scan showed 524cc urine , pt reports voiding within the hour prior to scan, she was asked to return to the BR to void and a bladder scan was done to check post void residual and that was 111cc. Will not do I&O cath for this amt.  Continued to encourage pt to try toileting every few hours

## 2019-02-21 NOTE — Progress Notes (Signed)
Patient showed 532 on bladder scan, after patient had urinated.  In and out catheter returned 400 cc's with some leakage onto pad.

## 2019-02-21 NOTE — Progress Notes (Signed)
PROGRESS NOTE    [name]  [id number] DOB: 1991-01-17 DOA: 02/18/2019 PCP: Vicenta Aly, FNP   Brief Narrative: 51 YOF with significant medical history with T1DM on insulin pump, GERD, asthma, diabetic gastroparesis/intractable cyclical vomiting, hypertension, history of previous DKA, pancreatitis presented again with similar episode of intractable nausea and vomiting and abdominal pain.  She has had multiple ER abuse and multiple admissions including ED visits yesterday ago and recent admission to [facility].She had 15 ED visits in the last 6 months to [facility] health system.  She was recently admitted to [facility] on Jan 21 and was discharged on Jan 26.  She went to [facility] because she had to wait for long time in the emergency department here. In the ER,found to have intractable nausea vomiting no DKA placed on IV fluids antiemetics and admitted for further management.  Subjective: Vomited this morning while I was in the room, tossing on the bed with pain. Complains of ongoing abdominal pain On soft diet. Assessment & Plan:   Intractable nausea and vomiting/diabetic gastroparesis/esophagitis/abdominal pain: Patient has had multiple ED visits and admissions for the same.Followed by Dr. Paulita Fujita from GI, endocrine at [facility] and missed recent appointments because of being in ED. still having abdominal pain intermittent vomiting.  Continue IV Dilaudid, IV Reglan 10 mg TD, erythromycin IV 250 mg TID, IVF and supportive care.Patient recently responded well to IV Reglan, IV erythromycin at [facility] and was on p.o. erythromycin at home supposed to be for 3 weeks from discharge. Continue Phenergan, PPI. Continue soft diet.    Uncontrolled type 1 diabetes mellitus on insulin pump/ history of prior DKA.  Patient A1c 7.8 1/30-stable. Blood sugar fairly stable.  Episode of hypoglycemia-but no more recurrence of hypoglycemia.  Continue on Lantus 10 units , SSI q4h. patient missed her appointment with  endocrinologist Dr. Oren Binet scheduled on 02/16/2019.?  Compliance issues. blood sugar remains a stable.. Recent Labs  Lab 02/20/19 2031 02/20/19 2351 02/21/19 0421 02/21/19 0742 02/21/19 1147  GLUCAP 335* 286* 192* 237* 176*   Hypertension: BP stable on home lisinopril.  Asthma not in exacerbation.  Abnormal UA with urine leukocytes RBC, likely with neurogenic bladder and on clean intermittent catheterization  q8 hr at home-chronic finding no dysuria or fever.No indication for antibiotics. Neurogenic bladder, on last discharge from [facility] patient was placed on CIC every 8 hours whether or not she has voided - she has follow up arranged with outpatient urology 02/22/19 at 8:00 AM with Dr. Matilde Sprang ([facility], [facility])- WILL NEED TO RESCHEDULE if still here. Continue on 3 times daily bladder scan and I/o  Cath.  Anemia, likely from chronic disease, On ferrous sulfate daily at home will hold for now, previous iron studies anemia from iron deficiency and chronic disease,B12 was normal.  Ferritin low 7, iron low at 18, b12, foalte nl again. Hemoglobin has dropped likely from IV fluid hydration.Cont Protonix to IV. Baseline hemoglobin around 9 g. Hb in mid 7 gm. transfsue <7, fecal occult blood pending Recent Labs  Lab 02/16/19 1342 02/17/19 2100 02/18/19 1246 02/19/19 0556 02/20/19 0436  HGB 9.6* 9.7* 9.7* 7.5* 7.4*  HCT 31.4* 32.0* 31.4* 25.5* 24.5*   Nutrition: CLD-ADAT to FLD as tolerated.  Body mass index is 20.59 kg/m.   DVT prophylaxis: lovenox Code Status:Full Family Communication: plan of care discussed with patient at bedside. Disposition Plan: Home once tolerates diet and no more episodes of vomiting.  Hopefully in next day or 2  Consultants: none Procedures:see note Microbiology: Antimicrobials:  Anti-infectives (From admission, onward)   Start     Dose/Rate Route Frequency Ordered Stop   02/19/19 0900  erythromycin 250 mg in sodium chloride 0.9 % 100 mL IVPB       250 mg 100 mL/hr over 60 Minutes Intravenous Every 8 hours 02/19/19 0814 02/24/19 0859     Medications: Scheduled Meds: . Chlorhexidine Gluconate Cloth  6 each Topical Daily  . feeding supplement (GLUCERNA SHAKE)  237 mL Oral TID BM  . insulin aspart  0-9 Units Subcutaneous Q4H  . insulin glargine  10 Units Subcutaneous Daily  . lisinopril  2.5 mg Oral Daily  . metoCLOPramide (REGLAN) injection  10 mg Intravenous TID  . multivitamin with minerals  1 tablet Oral Daily  . pantoprazole  40 mg Oral Daily  . sodium chloride flush  10-40 mL Intracatheter Q12H  . traZODone  50 mg Oral QHS   Continuous Infusions: . sodium chloride 100 mL/hr at 02/20/19 2158  . erythromycin 250 mg (02/21/19 0854)    Objective: Vitals:   02/20/19 0946 02/20/19 1323 02/20/19 2029 02/21/19 0418  BP: 117/74 111/75 (!) 138/99 110/74  Pulse: 82 86 87 78  Resp: 16 18 17 17   Temp: 98 F (36.7 C) 98.6 F (37 C) 98.8 F (37.1 C) 98.4 F (36.9 C)  TempSrc: Oral Oral Oral Oral  SpO2:  98% 97% 99%  Weight: 54.4 kg     Height: 5' 4.02" (1.626 m)       Intake/Output Summary (Last 24 hours) at 02/21/2019 1340 Last data filed at 02/21/2019 0900 Gross per 24 hour  Intake 600 ml  Output --  Net 600 ml   Filed Weights   02/20/19 0946  Weight: 54.4 kg   Weight change:   Body mass index is 20.59 kg/m.  Intake/Output from previous day: 02/01 0701 - 02/02 0700 In: 360 [P.O.:360] Out: 500 [Urine:500] Intake/Output this shift: Total I/O In: 240 [P.O.:240] Out: -   Examination:  General exam: Alert awake, anxious, weak, older than stated age.   HEENT:Oral mucosa moist, Ear/Nose WNL grossly, dentition normal. Respiratory system: Bilaterally clear, no wheezing or crackles,no use of accessory muscle Cardiovascular system: S1 & S2 +, No JVD,. Gastrointestinal system: Abdomen soft, there is guarding present, mildly tender,BS+ Nervous System:Alert, awake, moving extremities and grossly  nonfocal Extremities: No edema, distal peripheral pulses palpable.  Skin: No rashes,no icterus. MSK: Normal muscle bulk,tone, power   Data Reviewed: I have personally reviewed following labs and imaging studies  CBC: Recent Labs  Lab 02/16/19 1342 02/17/19 2100 02/18/19 1246 02/19/19 0556 02/20/19 0436  WBC 10.1 8.4 8.1 6.5 7.3  NEUTROABS 7.7  --  6.3  --   --   HGB 9.6* 9.7* 9.7* 7.5* 7.4*  HCT 31.4* 32.0* 31.4* 25.5* 24.5*  MCV 84.4 83.3 83.1 85.6 84.2  PLT 473* 489* 464* 350 [id number]   Basic Metabolic Panel: Recent Labs  Lab 02/16/19 1342 02/17/19 2100 02/18/19 1246 02/19/19 0556 02/20/19 0436  NA 140 139 136 136 138  K 3.8 3.8 3.8 3.9 3.8  CL 104 103 99 105 108  CO2 29 28 26 26 24   GLUCOSE 151* 84 306* 213* 150*  BUN 20 18 17 11 10   CREATININE 0.69 0.68 0.75 0.56 0.66  CALCIUM 9.3 9.5 9.1 8.2* 8.2*   GFR: Estimated Creatinine Clearance: 89.1 mL/min (by C-G formula based on SCr of 0.66 mg/dL). Liver Function Tests: Recent Labs  Lab 02/16/19 1342 02/17/19 2100 02/18/19 1246  AST 16 14*  15  ALT 10 10 10   ALKPHOS 69 72 67  BILITOT 0.7 0.8 0.7  PROT 7.2 7.4 6.8  ALBUMIN 4.0 4.3 3.8   Recent Labs  Lab 02/16/19 1342 02/17/19 2100 02/18/19 1246  LIPASE 15 14 13    No results for input(s): AMMONIA in the last 168 hours. Coagulation Profile: No results for input(s): INR, PROTIME in the last 168 hours. Cardiac Enzymes: No results for input(s): CKTOTAL, CKMB, CKMBINDEX, TROPONINI in the last 168 hours. BNP (last 3 results) No results for input(s): PROBNP in the last 8760 hours. HbA1C: No results for input(s): HGBA1C in the last 72 hours. CBG: Recent Labs  Lab 02/20/19 2031 02/20/19 2351 02/21/19 0421 02/21/19 0742 02/21/19 1147  GLUCAP 335* 286* 192* 237* 176*   Lipid Profile: No results for input(s): CHOL, HDL, LDLCALC, TRIG, CHOLHDL, LDLDIRECT in the last 72 hours. Thyroid Function Tests: No results for input(s): TSH, T4TOTAL, FREET4, T3FREE,  THYROIDAB in the last 72 hours. Anemia Panel: Recent Labs    02/20/19 1206  VITAMINB12 272  FOLATE 8.7  FERRITIN 7*  TIBC 324  IRON 18*  RETICCTPCT 1.2   Sepsis Labs: No results for input(s): PROCALCITON, LATICACIDVEN in the last 168 hours.  Recent Results (from the past 240 hour(s))  Urine culture     Status: Abnormal   Collection Time: 02/18/19  2:23 PM   Specimen: Urine, Clean Catch  Result Value Ref Range Status   Specimen Description   Final    URINE, CLEAN CATCH Performed at [facility], [place] [address], [place], [place] [address]    Special Requests   Final    NONE Performed at [facility], [place] [address] [place], [place] [address]    Culture (A)  Final    <10,000 COLONIES/mL INSIGNIFICANT GROWTH Performed at [place] [address] [place], [place] [address]    Report Status 02/20/2019 FINAL  Final  SARS CORONAVIRUS 2 (TAT 6-24 HRS) Nasopharyngeal Nasopharyngeal Swab     Status: None   Collection Time: 02/18/19  3:00 PM   Specimen: Nasopharyngeal Swab  Result Value Ref Range Status   SARS Coronavirus 2 NEGATIVE NEGATIVE Final    Comment: (NOTE) SARS-CoV-2 target nucleic acids are NOT DETECTED. The SARS-CoV-2 RNA is generally detectable in upper and lower respiratory specimens during the acute phase of infection. Negative results do not preclude SARS-CoV-2 infection, do not rule out co-infections with other pathogens, and should not be used as the sole basis for treatment or other patient management decisions. Negative results must be combined with clinical observations, patient history, and epidemiological information. The expected result is Negative. Fact Sheet for Patients: SugarRoll.be Fact Sheet for Healthcare Providers: https://www.woods-mathews.com/ This test is not yet approved or cleared by the [place] FDA and  has been authorized for detection and/or  diagnosis of SARS-CoV-2 by FDA under an Emergency Use Authorization (EUA). This EUA will remain  in effect (meaning this test can be used) for the duration of the COVID-19 declaration under Section 56 4(b)(1) of the Act, 21 U.S.C. section 360bbb-3(b)(1), unless the authorization is terminated or revoked sooner. Performed at [facility], [place] [address], [place], [place] [address]       Radiology Studies: No results found.    LOS: 2 days   Time spent: More than 50% of that time was spent in counseling and/or coordination of care.  Antonieta Pert, MD [facility]  02/21/2019, 1:40 PM

## 2019-02-22 DIAGNOSIS — E10649 Type 1 diabetes mellitus with hypoglycemia without coma: Secondary | ICD-10-CM

## 2019-02-22 DIAGNOSIS — I10 Essential (primary) hypertension: Secondary | ICD-10-CM

## 2019-02-22 LAB — CBC
HCT: 25.7 % — ABNORMAL LOW (ref 36.0–46.0)
Hemoglobin: 7.9 g/dL — ABNORMAL LOW (ref 12.0–15.0)
MCH: 25.6 pg — ABNORMAL LOW (ref 26.0–34.0)
MCHC: 30.7 g/dL (ref 30.0–36.0)
MCV: 83.4 fL (ref 80.0–100.0)
Platelets: 376 10*3/uL (ref 150–400)
RBC: 3.08 MIL/uL — ABNORMAL LOW (ref 3.87–5.11)
RDW: 15.5 % (ref 11.5–15.5)
WBC: 6.4 10*3/uL (ref 4.0–10.5)
nRBC: 0 % (ref 0.0–0.2)

## 2019-02-22 LAB — GLUCOSE, CAPILLARY
Glucose-Capillary: 111 mg/dL — ABNORMAL HIGH (ref 70–99)
Glucose-Capillary: 127 mg/dL — ABNORMAL HIGH (ref 70–99)
Glucose-Capillary: 154 mg/dL — ABNORMAL HIGH (ref 70–99)
Glucose-Capillary: 161 mg/dL — ABNORMAL HIGH (ref 70–99)
Glucose-Capillary: 267 mg/dL — ABNORMAL HIGH (ref 70–99)
Glucose-Capillary: 306 mg/dL — ABNORMAL HIGH (ref 70–99)
Glucose-Capillary: 352 mg/dL — ABNORMAL HIGH (ref 70–99)
Glucose-Capillary: 401 mg/dL — ABNORMAL HIGH (ref 70–99)

## 2019-02-22 LAB — BASIC METABOLIC PANEL
Anion gap: 9 (ref 5–15)
BUN: 8 mg/dL (ref 6–20)
CO2: 26 mmol/L (ref 22–32)
Calcium: 8.3 mg/dL — ABNORMAL LOW (ref 8.9–10.3)
Chloride: 103 mmol/L (ref 98–111)
Creatinine, Ser: 0.66 mg/dL (ref 0.44–1.00)
GFR calc Af Amer: >60 mL/min (ref >60)
GFR calc non Af Amer: >60 mL/min (ref >60)
Glucose, Bld: 324 mg/dL — ABNORMAL HIGH (ref 70–99)
Potassium: 3.8 mmol/L (ref 3.5–5.1)
Sodium: 138 mmol/L (ref 135–145)

## 2019-02-22 MED ORDER — PROMETHAZINE HCL 25 MG/ML IJ SOLN
25.0000 mg | Freq: Four times a day (QID) | INTRAMUSCULAR | Status: DC | PRN
  Administered 2019-02-22 – 2019-02-23 (×4): 25 mg via INTRAVENOUS
  Filled 2019-02-22 (×4): qty 1

## 2019-02-22 MED ORDER — POLYETHYLENE GLYCOL 3350 17 G PO PACK
17.0000 g | PACK | Freq: Two times a day (BID) | ORAL | Status: DC
  Filled 2019-02-22 (×2): qty 1

## 2019-02-22 MED ORDER — SENNOSIDES-DOCUSATE SODIUM 8.6-50 MG PO TABS
2.0000 | ORAL_TABLET | Freq: Two times a day (BID) | ORAL | Status: DC
  Administered 2019-02-22 – 2019-02-23 (×2): 2 via ORAL
  Filled 2019-02-22 (×2): qty 2

## 2019-02-22 MED ORDER — INSULIN ASPART 100 UNIT/ML ~~LOC~~ SOLN
10.0000 [IU] | Freq: Once | SUBCUTANEOUS | Status: AC
  Administered 2019-02-22: 21:00:00 10 [IU] via SUBCUTANEOUS

## 2019-02-22 MED ORDER — BISACODYL 10 MG RE SUPP
10.0000 mg | Freq: Once | RECTAL | Status: AC
  Administered 2019-02-22: 10 mg via RECTAL
  Filled 2019-02-22: qty 1

## 2019-02-22 NOTE — Progress Notes (Signed)
Inpatient Diabetes Program Recommendations  AACE/ADA: New Consensus Statement on Inpatient Glycemic Control (2015)  Target Ranges:  Prepandial:   less than 140 mg/dL      Peak postprandial:   less than 180 mg/dL (1-2 hours)      Critically ill patients:  140 - 180 mg/dL   Lab Results  Component Value Date   GLUCAP 154 (H) 02/22/2019   HGBA1C 7.8 (H) 02/18/2019    Review of Glycemic Control  Diabetes history: DM1 Outpatient Diabetes medications: Insulin pump with Novolog Current orders for Inpatient glycemic control: Lantus 10 units QD, Novolog 0-9 units Q4H.  Inpatient Diabetes Program Recommendations:     Will likely need more basal insulin if FBS > 180 mg/dL Add Novolog 4 units tidwc for meal coverage insulin if pt eats > 50% meal.  Will speak with pt this afternoon regarding status of insulin pump in OP setting. Will need to f/u with PCP for diabetes management.  Continue to follow.  Thank you. Lorenda Peck, RD, LDN, CDE Inpatient Diabetes Coordinator [phone]

## 2019-02-22 NOTE — Progress Notes (Signed)
PROGRESS NOTE    [name]  [id number] DOB: [birth year] DOA: 02/18/2019 PCP: Stefanie Aly, FNP   Brief Narrative: [name] is a 29 y.o. medical history with T1DM on insulin pump, GERD, asthma, diabetic gastroparesis/intractable cyclical vomiting, hypertension, history of previous DKA, pancreatitis. Patient presented secondary to nausea/vomiting with concern for gastroparesis flare. Currently on antiemetics and erythromycin with some improvement.   Assessment & Plan:   Principal Problem:   Intractable nausea and vomiting Active Problems:   Uncontrolled type 1 diabetes mellitus (HCC)   Hypertension   Gastroparesis   Intractable nausea and vomiting Secondary to gastroparesis. Several admissions/ED visits for similar symptoms. Symptoms slightly improved this morning. -Increase to Phenergan 25 mg, continue Reglan IV, erythromycin  Abdominal pain Secondary to above. -Continue Dilaudid IV while intolerant of oral intake  Diabetes mellitus, type 1 Uncontrolled with hyperglycemia  Essential hypertension Patient is on lisinopril as an outpatient -Continue lisinopril  Asthma Stable. No wheezing  Neurogenic bladder Patient follows with urology. Patient self catheterizes. -Continue CIC  Anemia Chronic. Slight decrease. Iron panel suggests iron deficiency anemia. -Iron supplementation on discharge  Constipation -Suppository, MiraLAX, Senakot-S   DVT prophylaxis: SCDs Code Status:   Code Status: Full Code Family Communication: None at bedside Disposition Plan: Discharge pending improvement of nausea/vomiting   Consultants:   None  Procedures:   None  Antimicrobials:  Erythromycin    Subjective: Nausea and vomiting improved but still persistent  Objective: Vitals:   02/21/19 2238 02/22/19 0605 02/22/19 0636 02/22/19 1344  BP: (!) 130/98 (!) 149/112 119/69 109/78  Pulse: 89   94  Resp: 18 16  16   Temp: 97.8 F (36.6 C) 98.2 F  (36.8 C)  98.2 F (36.8 C)  TempSrc: Oral Oral  Oral  SpO2: 100% 98%  98%  Weight:      Height:        Intake/Output Summary (Last 24 hours) at 02/22/2019 1819 Last data filed at 02/22/2019 0600 Gross per 24 hour  Intake 120 ml  Output 350 ml  Net -230 ml   Filed Weights   02/20/19 0946  Weight: 54.4 kg    Examination:  General exam: Appears calm and comfortable Respiratory system: Clear to auscultation. Respiratory effort normal. Cardiovascular system: S1 & S2 heard, RRR. No murmurs, rubs, gallops or clicks. Gastrointestinal system: Abdomen is nondistended, soft and mildly tender. No organomegaly or masses felt. Normal bowel sounds heard. Central nervous system: Alert and oriented. No focal neurological deficits. Extremities: No edema. No calf tenderness Skin: No cyanosis. No rashes Psychiatry: Judgement and insight appear normal. Mood & affect appropriate.     Data Reviewed: I have personally reviewed following labs and imaging studies  CBC: Recent Labs  Lab 02/16/19 1342 02/16/19 1342 02/17/19 2100 02/18/19 1246 02/19/19 0556 02/20/19 0436 02/22/19 0508  WBC 10.1   < > 8.4 8.1 6.5 7.3 6.4  NEUTROABS 7.7  --   --  6.3  --   --   --   HGB 9.6*   < > 9.7* 9.7* 7.5* 7.4* 7.9*  HCT 31.4*   < > 32.0* 31.4* 25.5* 24.5* 25.7*  MCV 84.4   < > 83.3 83.1 85.6 84.2 83.4  PLT 473*   < > 489* 464* 350 334 376   < > = values in this interval not displayed.   Basic Metabolic Panel: Recent Labs  Lab 02/17/19 2100 02/18/19 1246 02/19/19 0556 02/20/19 0436 02/22/19 0508  NA 139 136 136 138 138  K 3.8 3.8  3.9 3.8 3.8  CL 103 99 105 108 103  CO2 28 26 26 24 26   GLUCOSE 84 306* 213* 150* 324*  BUN 18 17 11 10 8   CREATININE 0.68 0.75 0.56 0.66 0.66  CALCIUM 9.5 9.1 8.2* 8.2* 8.3*   GFR: Estimated Creatinine Clearance: 89.1 mL/min (by C-G formula based on SCr of 0.66 mg/dL). Liver Function Tests: Recent Labs  Lab 02/16/19 1342 02/17/19 2100 02/18/19 1246  AST 16  14* 15  ALT 10 10 10   ALKPHOS 69 72 67  BILITOT 0.7 0.8 0.7  PROT 7.2 7.4 6.8  ALBUMIN 4.0 4.3 3.8   Recent Labs  Lab 02/16/19 1342 02/17/19 2100 02/18/19 1246  LIPASE 15 14 13    No results for input(s): AMMONIA in the last 168 hours. Coagulation Profile: No results for input(s): INR, PROTIME in the last 168 hours. Cardiac Enzymes: No results for input(s): CKTOTAL, CKMB, CKMBINDEX, TROPONINI in the last 168 hours. BNP (last 3 results) No results for input(s): PROBNP in the last 8760 hours. HbA1C: No results for input(s): HGBA1C in the last 72 hours. CBG: Recent Labs  Lab 02/22/19 0012 02/22/19 0504 02/22/19 0734 02/22/19 1139 02/22/19 1558  GLUCAP 111* 306* 154* 127* 161*   Lipid Profile: No results for input(s): CHOL, HDL, LDLCALC, TRIG, CHOLHDL, LDLDIRECT in the last 72 hours. Thyroid Function Tests: No results for input(s): TSH, T4TOTAL, FREET4, T3FREE, THYROIDAB in the last 72 hours. Anemia Panel: Recent Labs    02/20/19 1206  VITAMINB12 272  FOLATE 8.7  FERRITIN 7*  TIBC 324  IRON 18*  RETICCTPCT 1.2   Sepsis Labs: No results for input(s): PROCALCITON, LATICACIDVEN in the last 168 hours.  Recent Results (from the past 240 hour(s))  Urine culture     Status: Abnormal   Collection Time: 02/18/19  2:23 PM   Specimen: Urine, Clean Catch  Result Value Ref Range Status   Specimen Description   Final    URINE, CLEAN CATCH Performed at [facility], [place] [address] [place], [place] [address]    Special Requests   Final    NONE Performed at [facility], [place] [address], [place], [place] [address]    Culture (A)  Final    <10,000 COLONIES/mL INSIGNIFICANT GROWTH Performed at [place] [address] [place], [place] [address]    Report Status 02/20/2019 FINAL  Final  SARS CORONAVIRUS 2 (TAT 6-24 HRS) Nasopharyngeal Nasopharyngeal Swab     Status: None   Collection Time: 02/18/19  3:00 PM   Specimen:  Nasopharyngeal Swab  Result Value Ref Range Status   SARS Coronavirus 2 NEGATIVE NEGATIVE Final    Comment: (NOTE) SARS-CoV-2 target nucleic acids are NOT DETECTED. The SARS-CoV-2 RNA is generally detectable in upper and lower respiratory specimens during the acute phase of infection. Negative results do not preclude SARS-CoV-2 infection, do not rule out co-infections with other pathogens, and should not be used as the sole basis for treatment or other patient management decisions. Negative results must be combined with clinical observations, patient history, and epidemiological information. The expected result is Negative. Fact Sheet for Patients: SugarRoll.be Fact Sheet for Healthcare Providers: https://www.woods-mathews.com/ This test is not yet approved or cleared by the [place] FDA and  has been authorized for detection and/or diagnosis of SARS-CoV-2 by FDA under an Emergency Use Authorization (EUA). This EUA will remain  in effect (meaning this test can be used) for the duration of the COVID-19 declaration under Section  56 4(b)(1) of the Act, 21 U.S.C. section 360bbb-3(b)(1), unless the authorization is terminated or revoked sooner. Performed at [facility], [place] [address] [place], [place] [address]          Radiology Studies: No results found.      Scheduled Meds: . Chlorhexidine Gluconate Cloth  6 each Topical Daily  . feeding supplement (GLUCERNA SHAKE)  237 mL Oral TID BM  . insulin aspart  0-9 Units Subcutaneous Q4H  . insulin glargine  10 Units Subcutaneous Daily  . lisinopril  2.5 mg Oral Daily  . metoCLOPramide (REGLAN) injection  10 mg Intravenous TID  . multivitamin with minerals  1 tablet Oral Daily  . pantoprazole  40 mg Oral Daily  . polyethylene glycol  17 g Oral BID  . senna-docusate  2 tablet Oral BID  . sodium chloride flush  10-40 mL Intracatheter Q12H  . traZODone  50 mg Oral QHS    Continuous Infusions: . sodium chloride 30 mL/hr at 02/21/19 1346  . erythromycin 250 mg (02/22/19 1748)     LOS: 3 days     Cordelia Poche, MD [facility] 02/22/2019, 6:19 PM  If 7PM-7AM, please contact night-coverage [email or web address]

## 2019-02-23 DIAGNOSIS — K3184 Gastroparesis: Secondary | ICD-10-CM

## 2019-02-23 LAB — BASIC METABOLIC PANEL
Anion gap: 5 (ref 5–15)
BUN: 15 mg/dL (ref 6–20)
CO2: 30 mmol/L (ref 22–32)
Calcium: 8.6 mg/dL — ABNORMAL LOW (ref 8.9–10.3)
Chloride: 103 mmol/L (ref 98–111)
Creatinine, Ser: 0.61 mg/dL (ref 0.44–1.00)
GFR calc Af Amer: >60 mL/min (ref >60)
GFR calc non Af Amer: >60 mL/min (ref >60)
Glucose, Bld: 235 mg/dL — ABNORMAL HIGH (ref 70–99)
Potassium: 3.4 mmol/L — ABNORMAL LOW (ref 3.5–5.1)
Sodium: 138 mmol/L (ref 135–145)

## 2019-02-23 LAB — CBC
HCT: 25.1 % — ABNORMAL LOW (ref 36.0–46.0)
Hemoglobin: 7.6 g/dL — ABNORMAL LOW (ref 12.0–15.0)
MCH: 25.2 pg — ABNORMAL LOW (ref 26.0–34.0)
MCHC: 30.3 g/dL (ref 30.0–36.0)
MCV: 83.1 fL (ref 80.0–100.0)
Platelets: 365 10*3/uL (ref 150–400)
RBC: 3.02 MIL/uL — ABNORMAL LOW (ref 3.87–5.11)
RDW: 15.6 % — ABNORMAL HIGH (ref 11.5–15.5)
WBC: 6.4 10*3/uL (ref 4.0–10.5)
nRBC: 0 % (ref 0.0–0.2)

## 2019-02-23 LAB — GLUCOSE, CAPILLARY
Glucose-Capillary: 237 mg/dL — ABNORMAL HIGH (ref 70–99)
Glucose-Capillary: 171 mg/dL — ABNORMAL HIGH (ref 70–99)
Glucose-Capillary: 351 mg/dL — ABNORMAL HIGH (ref 70–99)

## 2019-02-23 MED ORDER — POTASSIUM CHLORIDE CRYS ER 20 MEQ PO TBCR
40.0000 meq | EXTENDED_RELEASE_TABLET | Freq: Once | ORAL | Status: AC
  Administered 2019-02-23: 12:00:00 40 meq via ORAL
  Filled 2019-02-23: qty 2

## 2019-02-23 MED ORDER — PROMETHAZINE HCL 25 MG RE SUPP: 25.0000 mg | Freq: Four times a day (QID) | RECTAL | 0 refills | Status: DC | PRN

## 2019-02-23 MED ORDER — SODIUM CHLORIDE 0.9 % IV SOLN
510.0000 mg | Freq: Once | INTRAVENOUS | Status: AC
  Administered 2019-02-23: 12:00:00 510 mg via INTRAVENOUS
  Filled 2019-02-23: qty 17

## 2019-02-23 MED ORDER — HEPARIN SOD (PORK) LOCK FLUSH 100 UNIT/ML IV SOLN
500.0000 [IU] | INTRAVENOUS | Status: AC | PRN
  Administered 2019-02-23: 14:00:00 500 [IU]

## 2019-02-23 MED ORDER — PROMETHAZINE HCL 25 MG PO TABS: 25.0000 mg | ORAL_TABLET | Freq: Four times a day (QID) | ORAL | 0 refills | Status: DC | PRN

## 2019-02-23 NOTE — Progress Notes (Signed)
Inpatient Diabetes Program Recommendations  AACE/ADA: New Consensus Statement on Inpatient Glycemic Control (2015)  Target Ranges:  Prepandial:   less than 140 mg/dL      Peak postprandial:   less than 180 mg/dL (1-2 hours)      Critically ill patients:  140 - 180 mg/dL   Lab Results  Component Value Date   GLUCAP 171 (H) 02/23/2019   HGBA1C 7.8 (H) 02/18/2019    Review of Glycemic Control  Spoke with pt briefly yesterday afternoon, although pt stated she was very nauseated, c/o stomach pain and was rocking back and forth on the bed.  Said she has her new insulin pump and has been using it. Had to remove it when she presented to ED, and has no pump supplies with her. Will restart when discharged. Instructed to wait approx 24H from last Lantus dose, before turning on the basal rate. Can use bolus rate for meals and correction while basal is suspended. Pt voices understanding.  Instructed pt to f/u with Endo (Dr Hartford Poli) within a week of discharge.   Thank you. Lorenda Peck, RD, LDN, CDE Inpatient Diabetes Coordinator [phone]

## 2019-02-23 NOTE — Discharge Summary (Addendum)
Physician Discharge Summary  [name] ZHY:[id number] DOB: 1990-10-17 DOA: 02/18/2019  PCP: Vicenta Aly, FNP  Admit date: 02/18/2019 Discharge date: 02/23/2019  Admitted From: Home Disposition: Home  Recommendations for Outpatient Follow-up:  1. Follow up with PCP in 1 week 2. Please obtain BMP/CBC in one week 3. Please follow up on the following pending results: None  Home Health: None Equipment/Devices: None  Discharge Condition: Stable CODE STATUS: Full code Diet recommendation: Carb modified   Brief/Interim Summary:  Admission HPI written by Shelly Coss, MD   HPI: [name] is a 29 y.o. female with medical history significant of type 1 diabetes, GERD, asthma, diabetic gastroparesis, hypertension, intractable cyclic vomiting syndrome, pancreatitis who presented with intractable nausea and vomiting along with abdominal pain.  This has been going on for last 3 days.  She was unable to tolerate anything by mouth.  Patient had numerous admissions with same complaints in the past and sometimes with DKA.  She had 15 ED visits in the last 6 months to [facility] health system.  She was recently admitted to [facility] on July 21 and was discharged on July 26.  She went to [facility] because she had to wait for long time in the emergency department here.  She was managed for DKA. She got a port on the right chest during that hospitalization. Patient is currently on insulin pump at home.  Her last presentation to the emergency  department was in 02/16/2019 and she was discharged home from ED. Patient seen and examined at the bedside in the emergency department.  Currently she is hemodynamically stable.  She complains of nausea, vomiting and abdominal pain like before.  She denies any fever, chills, chest pain, shortness of breath, diarrhea.  ED Course: Lab works did not show DKA at this time.  Since patient is unable to tolerate anything by mouth, decision was made to admit to  treat with IV fluids and antiemetics    Hospital course:  Intractable nausea and vomiting Secondary to gastroparesis. Several admissions/ED visits for similar symptoms. Patient managed on IV phenergan, Reglan and erythromycin with improvement of nausea/vomiting. Discharged on home regimen. Refill supplied for Phenergan.  Abdominal pain Secondary to above. Improved.  Diabetes mellitus, type 1 Uncontrolled with hypoglycemia  Essential hypertension Patient is on lisinopril as an outpatient. Continue lisinopril  Asthma Stable. No wheezing  Neurogenic bladder Patient follows with urology. Patient self catheterizes. Continue intermittent catheterizations. Patient needs to follow-up with urologist on discharge.  Anemia Chronic. Slight decrease. Iron panel suggests iron deficiency anemia. Given one dose of Feraheme IV. Continue iron supplementation on discharge  Constipation Suppository, MiraLAX, Senakot-S given while inpatient.  Discharge Diagnoses:  Principal Problem:   Intractable nausea and vomiting Active Problems:   Uncontrolled type 1 diabetes mellitus ([place])   Hypertension   Gastroparesis    Discharge Instructions   Allergies as of 02/23/2019      Reactions   Peanut-containing Drug Products Swelling, Other (See Comments)   Reaction:  Swelling of mouth and lips    Food Swelling, Other (See Comments)   Pt is allergic to strawberries.   Reaction:  Swelling of mouth and lips    Ultram [tramadol] Itching      Medication List    TAKE these medications   albuterol 108 (90 Base) MCG/ACT inhaler Commonly known as: VENTOLIN HFA Inhale 1-2 puffs into the lungs every 6 (six) hours as needed for wheezing or shortness of breath.   blood glucose meter kit and  supplies Dispense based on patient and insurance preference. Use up to four times daily as directed. (FOR ICD-10 E10.9, E11.9).   ferrous sulfate 324 (65 Fe) MG Tbec Take 1 tablet by mouth every other day.     glucagon 1 MG injection Inject 1 mg into the muscle once as needed (For very low blood sugars. May repeat in 15-20 minutes if needed.).   Insulin Pen Needle 31G X 5 MM Misc 30 Units by Does not apply route at bedtime.   insulin pump Soln Inject 0-10 each into the skin continuous. Per sliding scale - Novolog 100 units/ml   lisinopril 2.5 MG tablet Commonly known as: ZESTRIL Take 2.5 mg by mouth daily.   metoCLOPramide 10 MG tablet Commonly known as: REGLAN Take 1 tablet (10 mg total) by mouth 3 (three) times daily with meals.   pantoprazole 40 MG tablet Commonly known as: PROTONIX Take 40 mg by mouth daily.   promethazine 25 MG tablet Commonly known as: PHENERGAN Take 1 tablet (25 mg total) by mouth every 6 (six) hours as needed for nausea or vomiting. What changed: Another medication with the same name was changed. Make sure you understand how and when to take each.   promethazine 25 MG suppository Commonly known as: PHENERGAN Place 1 suppository (25 mg total) rectally every 6 (six) hours as needed (If unable to take oral phenergan). What changed: reasons to take this   traZODone 50 MG tablet Commonly known as: DESYREL Take 50 mg by mouth at bedtime.      Follow-up Information    Vicenta Aly, FNP Follow up on 03/07/2019.   Specialty: Nurse Practitioner Why: Tuesday at 11:30 for a hospital follow up appointment.  This will be a virtual visit through my chart. Call the office for help if you do not currently have a my chart account. Contact information: [place] [address] [phone]          Allergies  Allergen Reactions  . Peanut-Containing Drug Products Swelling and Other (See Comments)    Reaction:  Swelling of mouth and lips   . 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   Reaction:  Swelling of mouth and lips   . Ultram [Tramadol] Itching     Consultations:  None   Procedures/Studies: CT ABDOMEN PELVIS W CONTRAST  Result Date: 02/18/2019 CLINICAL DATA:  Severe diffuse abdominal pain, nausea, and vomiting for 3 days. EXAM: CT ABDOMEN AND PELVIS WITH CONTRAST TECHNIQUE: Multidetector CT imaging of the abdomen and pelvis was performed using the standard protocol following bolus administration of intravenous contrast. CONTRAST:  160m OMNIPAQUE IOHEXOL 300 MG/ML  SOLN COMPARISON:  12/28/2018 FINDINGS: Lower Chest: No acute findings. Hepatobiliary: No hepatic masses identified. Prior cholecystectomy. No evidence of biliary obstruction. Pancreas:  No mass or inflammatory changes. Spleen: Within normal limits in size and appearance. Adrenals/Urinary Tract: No masses identified. No evidence of hydronephrosis. Areas of decreased enhancement in the right kidney decreased since previous study, consistent with resolving pyelonephritis. Previously seen right hydroureteronephrosis has resolved. Diffuse bladder wall thickening and mucosal enhancement show mild improvement, consistent with improving cystitis. Stomach/Bowel: Persistent wall thickening distal thoracic esophagus, consistent with esophagitis. No evidence of hiatal hernia. No evidence of bowel obstruction or wall thickening, or abnormal fluid collections. Vascular/Lymphatic: No pathologically enlarged lymph nodes. No abdominal aortic aneurysm. Reproductive: 1.7 cm posterior uterine fibroid noted. Adnexal regions are unremarkable. Other:  None. Musculoskeletal:  No suspicious bone  lesions identified. IMPRESSION: 1. No acute findings. 2. Improving right pyelonephritis and resolution of right hydroureteronephrosis since previous study. 3. Interval decrease in diffuse bladder wall thickening, consistent with improving cystitis. 4. Stable wall thickening of distal thoracic esophagus, consistent with esophagitis. 5. 1.7 cm posterior uterine fibroid. Electronically Signed   By: Marlaine Hind M.D.   On:  02/18/2019 14:17   DG Abd Acute W/Chest  Result Date: 02/16/2019 CLINICAL DATA:  Abdominal pain. EXAM: DG ABDOMEN ACUTE W/ 1V CHEST COMPARISON:  CT 12/28/2018.  Abdomen series 12/21/2018. FINDINGS: PowerPort catheter noted with tip over superior vena cava. No acute cardiopulmonary disease identified. Surgical clips right upper quadrant. Stool noted throughout the colon. No bowel distention or free air. Pelvic calcifications consistent phleboliths again noted. No acute bony abnormality identified. IMPRESSION: 1. PowerPort catheter with tip over superior vena cava. No acute cardiopulmonary disease. 2. No acute intra-abdominal abnormality identified. Stool noted throughout the colon. Electronically Signed   By: Marcello Moores  Register   On: 02/16/2019 14:40      Subjective: No nausea/vomiting  Discharge Exam: Vitals:   02/22/19 2056 02/23/19 0500  BP: (!) 139/92 (!) 143/93  Pulse: 94 90  Resp: 16 16  Temp: 99.3 F (37.4 C) 98.9 F (37.2 C)  SpO2: 100% 100%   Vitals:   02/22/19 0636 02/22/19 1344 02/22/19 2056 02/23/19 0500  BP: 119/69 109/78 (!) 139/92 (!) 143/93  Pulse:  94 94 90  Resp:  16 16 16   Temp:  98.2 F (36.8 C) 99.3 F (37.4 C) 98.9 F (37.2 C)  TempSrc:  Oral Oral Oral  SpO2:  98% 100% 100%  Weight:      Height:        General: Pt is alert, awake, not in acute distress Cardiovascular: RRR, S1/S2 +, no rubs, no gallops Respiratory: CTA bilaterally, no wheezing, no rhonchi Abdominal: Soft, NT, ND, bowel sounds + Extremities: no edema, no cyanosis    The results of significant diagnostics from this hospitalization (including imaging, microbiology, ancillary and laboratory) are listed below for reference.     Microbiology: Recent Results (from the past 240 hour(s))  Urine culture     Status: Abnormal   Collection Time: 02/18/19  2:23 PM   Specimen: Urine, Clean Catch  Result Value Ref Range Status   Specimen Description   Final    URINE, CLEAN CATCH Performed at  [facility], [place] [address], [place], [place] [address]    Special Requests   Final    NONE Performed at [facility], [place] [address] [place], [place] [address]    Culture (A)  Final    <10,000 COLONIES/mL INSIGNIFICANT GROWTH Performed at [place] [address], [place], [place] [address]    Report Status 02/20/2019 FINAL  Final  SARS CORONAVIRUS 2 (TAT 6-24 HRS) Nasopharyngeal Nasopharyngeal Swab     Status: None   Collection Time: 02/18/19  3:00 PM   Specimen: Nasopharyngeal Swab  Result Value Ref Range Status   SARS Coronavirus 2 NEGATIVE NEGATIVE Final    Comment: (NOTE) SARS-CoV-2 target nucleic acids are NOT DETECTED. The SARS-CoV-2 RNA is generally detectable in upper and lower respiratory specimens during the acute phase of infection. Negative results do not preclude SARS-CoV-2 infection, do not rule out co-infections with other pathogens, and should not be used as the sole basis for treatment or other patient management decisions. Negative results must be combined with clinical observations, patient history, and epidemiological information. The expected  result is Negative. Fact Sheet for Patients: SugarRoll.be Fact Sheet for Healthcare Providers: https://www.woods-mathews.com/ This test is not yet approved or cleared by the [place] FDA and  has been authorized for detection and/or diagnosis of SARS-CoV-2 by FDA under an Emergency Use Authorization (EUA). This EUA will remain  in effect (meaning this test can be used) for the duration of the COVID-19 declaration under Section 56 4(b)(1) of the Act, 21 U.S.C. section 360bbb-3(b)(1), unless the authorization is terminated or revoked sooner. Performed at [facility], [place] [address], [place], [place] [address]      Labs: BNP (last 3 results) No results for input(s): BNP in the last 8760 hours. Basic Metabolic  Panel: Recent Labs  Lab 02/18/19 1246 02/19/19 0556 02/20/19 0436 02/22/19 0508 02/23/19 0428  NA 136 136 138 138 138  K 3.8 3.9 3.8 3.8 3.4*  CL 99 105 108 103 103  CO2 26 26 24 26 30   GLUCOSE 306* 213* 150* 324* 235*  BUN 17 11 10 8 15   CREATININE 0.75 0.56 0.66 0.66 0.61  CALCIUM 9.1 8.2* 8.2* 8.3* 8.6*   Liver Function Tests: Recent Labs  Lab 02/16/19 1342 02/17/19 2100 02/18/19 1246  AST 16 14* 15  ALT 10 10 10   ALKPHOS 69 72 67  BILITOT 0.7 0.8 0.7  PROT 7.2 7.4 6.8  ALBUMIN 4.0 4.3 3.8   Recent Labs  Lab 02/16/19 1342 02/17/19 2100 02/18/19 1246  LIPASE 15 14 13    No results for input(s): AMMONIA in the last 168 hours. CBC: Recent Labs  Lab 02/16/19 1342 02/17/19 2100 02/18/19 1246 02/19/19 0556 02/20/19 0436 02/22/19 0508 02/23/19 0428  WBC 10.1   < > 8.1 6.5 7.3 6.4 6.4  NEUTROABS 7.7  --  6.3  --   --   --   --   HGB 9.6*   < > 9.7* 7.5* 7.4* 7.9* 7.6*  HCT 31.4*   < > 31.4* 25.5* 24.5* 25.7* 25.1*  MCV 84.4   < > 83.1 85.6 84.2 83.4 83.1  PLT 473*   < > 464* 350 334 376 365   < > = values in this interval not displayed.   Cardiac Enzymes: No results for input(s): CKTOTAL, CKMB, CKMBINDEX, TROPONINI in the last 168 hours. BNP: Invalid input(s): POCBNP CBG: Recent Labs  Lab 02/22/19 2158 02/22/19 2335 02/23/19 0502 02/23/19 0749 02/23/19 1152  GLUCAP 352* 267* 237* 171* 351*   D-Dimer No results for input(s): DDIMER in the last 72 hours. Hgb A1c No results for input(s): HGBA1C in the last 72 hours. Lipid Profile No results for input(s): CHOL, HDL, LDLCALC, TRIG, CHOLHDL, LDLDIRECT in the last 72 hours. Thyroid function studies No results for input(s): TSH, T4TOTAL, T3FREE, THYROIDAB in the last 72 hours.  Invalid input(s): FREET3 Anemia work up No results for input(s): VITAMINB12, FOLATE, FERRITIN, TIBC, IRON, RETICCTPCT in the last 72 hours. Urinalysis    Component Value Date/Time   COLORURINE YELLOW 02/18/2019 1246    APPEARANCEUR HAZY (A) 02/18/2019 1246   LABSPEC 1.014 02/18/2019 1246   PHURINE 7.0 02/18/2019 1246   GLUCOSEU >=500 (A) 02/18/2019 1246   GLUCOSEU >=1000 11/07/2012 1205   HGBUR MODERATE (A) 02/18/2019 1246   BILIRUBINUR NEGATIVE 02/18/2019 1246   KETONESUR 20 (A) 02/18/2019 1246   PROTEINUR 30 (A) 02/18/2019 1246   UROBILINOGEN 0.2 11/24/2014 1045   NITRITE NEGATIVE 02/18/2019 1246   LEUKOCYTESUR LARGE (A) 02/18/2019 1246   Sepsis Labs Invalid input(s): PROCALCITONIN,  WBC,  Cope Microbiology Recent Results (from the past 240 hour(s))  Urine culture     Status: Abnormal   Collection Time: 02/18/19  2:23 PM   Specimen: Urine, Clean Catch  Result Value Ref Range Status   Specimen Description   Final    URINE, CLEAN CATCH Performed at [facility], [place] [address] [place], [place] [address]    Special Requests   Final    NONE Performed at [facility], [place] [address] [place], [place] [address]    Culture (A)  Final    <10,000 COLONIES/mL INSIGNIFICANT GROWTH Performed at [place] [address], [place], [place] [address]    Report Status 02/20/2019 FINAL  Final  SARS CORONAVIRUS 2 (TAT 6-24 HRS) Nasopharyngeal Nasopharyngeal Swab     Status: None   Collection Time: 02/18/19  3:00 PM   Specimen: Nasopharyngeal Swab  Result Value Ref Range Status   SARS Coronavirus 2 NEGATIVE NEGATIVE Final    Comment: (NOTE) SARS-CoV-2 target nucleic acids are NOT DETECTED. The SARS-CoV-2 RNA is generally detectable in upper and lower respiratory specimens during the acute phase of infection. Negative results do not preclude SARS-CoV-2 infection, do not rule out co-infections with other pathogens, and should not be used as the sole basis for treatment or other patient management decisions. Negative results must be combined with clinical observations, patient history, and epidemiological information. The expected result is  Negative. Fact Sheet for Patients: SugarRoll.be Fact Sheet for Healthcare Providers: https://www.woods-mathews.com/ This test is not yet approved or cleared by the [place] FDA and  has been authorized for detection and/or diagnosis of SARS-CoV-2 by FDA under an Emergency Use Authorization (EUA). This EUA will remain  in effect (meaning this test can be used) for the duration of the COVID-19 declaration under Section 56 4(b)(1) of the Act, 21 U.S.C. section 360bbb-3(b)(1), unless the authorization is terminated or revoked sooner. Performed at [facility], [place] [address], [place], [place] [address]      Time coordinating discharge: 35 minutes  SIGNED:   Cordelia Poche, MD [facility] 02/23/2019, 12:41 PM

## 2019-02-23 NOTE — Discharge Instructions (Signed)
[name],  You were in the hospital because of nausea/vomiting possibly from gastroparesis. Please follow-up with your primary care physician. I have increased your phenergan but only for a short duration. You have recently received pain medication, so I have not prescribed more but you can use Tylenol over the counter. You received IV iron prior to being discharged; please continue your iron supplements.

## 2019-02-24 ENCOUNTER — Emergency Department (HOSPITAL_COMMUNITY)
Admission: EM | Admit: 2019-02-24 | Discharge: 2019-02-24 | Disposition: A | Discharge status: Home / Self Care | Payer: BC Managed Care – PPO | Attending: Emergency Medicine | Admitting: Emergency Medicine

## 2019-02-24 ENCOUNTER — Other Ambulatory Visit: Payer: Self-pay

## 2019-02-24 DIAGNOSIS — Z79899 Other long term (current) drug therapy: Secondary | ICD-10-CM | POA: Insufficient documentation

## 2019-02-24 DIAGNOSIS — I509 Heart failure, unspecified: Secondary | ICD-10-CM | POA: Diagnosis not present

## 2019-02-24 DIAGNOSIS — R112 Nausea with vomiting, unspecified: Secondary | ICD-10-CM | POA: Insufficient documentation

## 2019-02-24 DIAGNOSIS — E1065 Type 1 diabetes mellitus with hyperglycemia: Secondary | ICD-10-CM | POA: Insufficient documentation

## 2019-02-24 DIAGNOSIS — I10 Essential (primary) hypertension: Secondary | ICD-10-CM | POA: Diagnosis not present

## 2019-02-24 DIAGNOSIS — J45909 Unspecified asthma, uncomplicated: Secondary | ICD-10-CM | POA: Diagnosis not present

## 2019-02-24 DIAGNOSIS — R739 Hyperglycemia, unspecified: Secondary | ICD-10-CM

## 2019-02-24 LAB — CBC WITH DIFFERENTIAL/PLATELET
Abs Immature Granulocytes: 0.03 10*3/uL (ref 0.00–0.07)
Basophils Absolute: 0.1 10*3/uL (ref 0.0–0.1)
Basophils Relative: 0 %
Eosinophils Absolute: 0.2 10*3/uL (ref 0.0–0.5)
Eosinophils Relative: 2 %
HCT: 30.8 % — ABNORMAL LOW (ref 36.0–46.0)
Hemoglobin: 9.3 g/dL — ABNORMAL LOW (ref 12.0–15.0)
Immature Granulocytes: 0 %
Lymphocytes Relative: 13 %
Lymphs Abs: 1.6 10*3/uL (ref 0.7–4.0)
MCH: 25.1 pg — ABNORMAL LOW (ref 26.0–34.0)
MCHC: 30.2 g/dL (ref 30.0–36.0)
MCV: 83.2 fL (ref 80.0–100.0)
Monocytes Absolute: 0.5 10*3/uL (ref 0.1–1.0)
Monocytes Relative: 4 %
Neutro Abs: 9.4 10*3/uL — ABNORMAL HIGH (ref 1.7–7.7)
Neutrophils Relative %: 81 %
Platelets: 444 10*3/uL — ABNORMAL HIGH (ref 150–400)
RBC: 3.7 MIL/uL — ABNORMAL LOW (ref 3.87–5.11)
RDW: 15.9 % — ABNORMAL HIGH (ref 11.5–15.5)
WBC: 11.7 10*3/uL — ABNORMAL HIGH (ref 4.0–10.5)
nRBC: 0 % (ref 0.0–0.2)

## 2019-02-24 LAB — COMPREHENSIVE METABOLIC PANEL
ALT: 10 U/L (ref 0–44)
AST: 13 U/L — ABNORMAL LOW (ref 15–41)
Albumin: 3.9 g/dL (ref 3.5–5.0)
Alkaline Phosphatase: 77 U/L (ref 38–126)
Anion gap: 14 (ref 5–15)
BUN: 13 mg/dL (ref 6–20)
CO2: 22 mmol/L (ref 22–32)
Calcium: 9.4 mg/dL (ref 8.9–10.3)
Chloride: 101 mmol/L (ref 98–111)
Creatinine, Ser: 0.85 mg/dL (ref 0.44–1.00)
GFR calc Af Amer: >60 mL/min (ref >60)
GFR calc non Af Amer: >60 mL/min (ref >60)
Glucose, Bld: 499 mg/dL — ABNORMAL HIGH (ref 70–99)
Potassium: 4.2 mmol/L (ref 3.5–5.1)
Sodium: 137 mmol/L (ref 135–145)
Total Bilirubin: 1.1 mg/dL (ref 0.3–1.2)
Total Protein: 7.2 g/dL (ref 6.5–8.1)

## 2019-02-24 LAB — RAPID URINE DRUG SCREEN, HOSP PERFORMED
Amphetamines: NOT DETECTED
Barbiturates: NOT DETECTED
Benzodiazepines: NOT DETECTED
Cocaine: NOT DETECTED
Opiates: NOT DETECTED
Tetrahydrocannabinol: NOT DETECTED

## 2019-02-24 LAB — URINALYSIS, ROUTINE W REFLEX MICROSCOPIC
Bilirubin Urine: NEGATIVE
Glucose, UA: >=500 mg/dL — AB
Ketones, ur: 80 mg/dL — AB
Nitrite: NEGATIVE
Protein, ur: 30 mg/dL — AB
RBC / HPF: >50 RBC/hpf — ABNORMAL HIGH (ref 0–5)
Specific Gravity, Urine: 1.024 (ref 1.005–1.030)
WBC, UA: >50 WBC/hpf — ABNORMAL HIGH (ref 0–5)
pH: 6 (ref 5.0–8.0)

## 2019-02-24 LAB — I-STAT BETA HCG BLOOD, ED (MC, WL, AP ONLY): I-stat hCG, quantitative: <5 m[IU]/mL (ref <5)

## 2019-02-24 LAB — CBG MONITORING, ED
Glucose-Capillary: 437 mg/dL — ABNORMAL HIGH (ref 70–99)
Glucose-Capillary: 243 mg/dL — ABNORMAL HIGH (ref 70–99)

## 2019-02-24 LAB — LIPASE, BLOOD: Lipase: 14 U/L (ref 11–51)

## 2019-02-24 MED ORDER — INSULIN ASPART 100 UNIT/ML ~~LOC~~ SOLN
5.0000 [IU] | Freq: Once | SUBCUTANEOUS | Status: AC
  Administered 2019-02-24: 5 [IU] via SUBCUTANEOUS
  Filled 2019-02-24: qty 0.05

## 2019-02-24 MED ORDER — SODIUM CHLORIDE 0.9 % IV SOLN
1.0000 g | Freq: Once | INTRAVENOUS | Status: AC
  Administered 2019-02-24: 1 g via INTRAVENOUS
  Filled 2019-02-24: qty 10

## 2019-02-24 MED ORDER — HALOPERIDOL LACTATE 5 MG/ML IJ SOLN
5.0000 mg | Freq: Once | INTRAMUSCULAR | Status: AC
  Administered 2019-02-24: 5 mg via INTRAVENOUS
  Filled 2019-02-24: qty 1

## 2019-02-24 MED ORDER — HEPARIN SOD (PORK) LOCK FLUSH 100 UNIT/ML IV SOLN
500.0000 [IU] | Freq: Once | INTRAVENOUS | Status: AC
  Administered 2019-02-24: 500 [IU]
  Filled 2019-02-24: qty 5

## 2019-02-24 MED ORDER — PROMETHAZINE HCL 25 MG/ML IJ SOLN
12.5000 mg | Freq: Once | INTRAMUSCULAR | Status: AC
  Administered 2019-02-24: 12.5 mg via INTRAVENOUS
  Filled 2019-02-24: qty 1

## 2019-02-24 MED ORDER — SODIUM CHLORIDE 0.9 % IV BOLUS
1000.0000 mL | Freq: Once | INTRAVENOUS | Status: AC
  Administered 2019-02-24: 1000 mL via INTRAVENOUS

## 2019-02-24 NOTE — ED Provider Notes (Signed)
[facility] DEPT Provider Note   CSN: [id number] Arrival date & time: 02/24/19  0907     History Chief Complaint  Patient presents with  . Emesis  . Abdominal Pain    [name] is a 29 y.o. female.  The history is provided by the patient and medical records. No language interpreter was used.  Emesis Associated symptoms: abdominal pain   Abdominal Pain Associated symptoms: vomiting      29 year old female with history of uncontrolled type 1 diabetes, CHF, diabetic gastroparesis, pancreatitis, marijuana abuse presenting for evaluation of abdominal pain.  Patient was recently hospitalized for intractable nausea and vomiting and stay in hospital for approximate 4 days discharged yesterday.  She is here complaining of diffuse abdominal discomfort, nauseous, and persistent vomiting of nonbloody nonbilious content.  Abdominal pain is sharp and achy moderate in severity.  States she cannot keep anything down.  She tried taking her Phenergan at home without relief.  She is requesting for Dilaudid and Phenergan for her symptom.  She does not complain of any fever chills chest pain shortness of breath or productive cough.  No urinary symptom.  Patient has been seen in in the ED multiple times for similar complaint she denies any recent alcohol or drug use.  Denies any recent marijuana use.  Past Medical History:  Diagnosis Date  . Anxiety   . Arthritis    "hands, feet, knees" (12/18/2016)  . Asthma   . Diabetic gastroparesis ([place])    Per gastric emptying study 07/09/16 which showed significant delayed gastric emptying.  . Gallstones   . Gastroparesis   . GERD (gastroesophageal reflux disease)   . Heart murmur   . Hepatic steatosis 11/26/2014   and hepatomegaly  . Hypertension    hx (12/18/2016)  . Intractable cyclical vomiting syndrome    Archie Endo 12/18/2016  . Liver mass 11/26/2014  . Pancreatitis, acute 11/26/2014  . Pneumonia    "as a teen X 1"  (12/18/2016)  . Type I diabetes mellitus ([place]) 2007   IDDM.  poorly controlled, multiple admits with DKA    Patient Active Problem List   Diagnosis Date Noted  . Gastroparesis due to secondary diabetes ([place]) 11/21/2018  . Diabetes mellitus ([place]) 11/13/2018  . HTN (hypertension) 11/13/2018  . Marijuana abuse 11/13/2018  . Acute pyelonephritis 10/07/2018  . Hydronephrosis of right kidney 10/05/2018  . Diffuse pain   . Elevated blood pressure reading 12/03/2017  . Tachycardia 11/30/2017  . Hypernatremia 11/02/2017  . Nausea and vomiting 01/22/2017  . Nausea & vomiting 01/21/2017  . Intractable cyclical vomiting syndrome 12/18/2016  . Leukocytosis 10/24/2016  . N&V (nausea and vomiting) 10/23/2016  . DKA, type 1 ([place]) 09/26/2016  . Thrombocytosis ([place]) 07/08/2016  . Candiduria, asymptomatic 06/23/2016  . Hyponatremia 06/13/2016  . Hematuria 05/13/2016  . UTI (urinary tract infection) 01/22/2016  . Diarrhea 11/09/2015  . Acute urinary retention   . Gastroparesis due to DM ([place]) 07/10/2015  . GERD (gastroesophageal reflux disease) 07/10/2015  . Depression with anxiety 07/10/2015  . Gastroparesis 06/22/2015  . Altered mental status 06/22/2015  . Volume depletion 06/10/2015  . Protein-calorie malnutrition, severe 06/10/2015  . Hyperglycemia   . Elevated bilirubin   . Hematemesis with nausea   . Intractable nausea and vomiting 05/28/2015  . Abdominal pain in female   . Abdominal pain 05/24/2015  . Hypertension 05/24/2015  . Dehydration   . Diabetic gastroparesis ([place])   . Chronic diastolic heart failure ([place]) 04/11/2015  . Hematemesis  04/08/2015  . DKA (diabetic ketoacidoses) ([place]) 03/22/2015  . S/P laparoscopic cholecystectomy 02/11/2015  . Postextubation stridor   . Pancreatitis, acute 11/26/2014  . Volume overload 11/26/2014  . Hypokalemia 11/26/2014  . Hepatic steatosis 11/26/2014  . Liver mass 11/26/2014  . Sepsis ([place]) 11/25/2014  . Sinus tachycardia 11/25/2014    . Hypomagnesemia 11/25/2014  . Hypophosphatemia 11/25/2014  . Elevated LFTs 11/24/2014  . AKI (acute kidney injury) ([place]) 11/24/2014  . Migraine headache 11/24/2014  . Asthma 06/29/2012  . Uncontrolled type 1 diabetes mellitus ([place]) 06/19/2010  . Goiter, unspecified 06/19/2010    Past Surgical History:  Procedure Laterality Date  . CHOLECYSTECTOMY N/A 02/11/2015   Procedure: LAPAROSCOPIC CHOLECYSTECTOMY WITH INTRAOPERATIVE CHOLANGIOGRAM;  Surgeon: Greer Pickerel, MD;  Location: [facility] ORS;  Service: General;  Laterality: N/A;  . ESOPHAGOGASTRODUODENOSCOPY (EGD) WITH PROPOFOL Left 09/20/2014   Procedure: ESOPHAGOGASTRODUODENOSCOPY (EGD) WITH PROPOFOL;  Surgeon: Arta Silence, MD;  Location[facility];  Service: Endoscopy;  Laterality: Left;  . WISDOM TOOTH EXTRACTION       OB History    Gravida  2   Para  1   Term  0   Preterm  1   AB  1   Living  1     SAB  0   TAB  1   Ectopic  0   Multiple  0   Live Births  1           Family History  Problem Relation Age of Onset  . Heart disease Maternal Grandmother   . Heart disease Maternal Grandfather   . Diabetes Mother   . Hyperlipidemia Mother   . Hypertension Father   . Heart disease Father   . Hypertension Paternal Grandmother   . Cancer Paternal Grandfather     Social History   Tobacco Use  . Smoking status: Never Smoker  . Smokeless tobacco: Never Used  Substance Use Topics  . Alcohol use: No  . Drug use: Not Currently    Types: Marijuana    Home Medications Prior to Admission medications   Medication Sig Start Date End Date Taking? Authorizing Provider  albuterol (PROVENTIL HFA;VENTOLIN HFA) 108 (90 Base) MCG/ACT inhaler Inhale 1-2 puffs into the lungs every 6 (six) hours as needed for wheezing or shortness of breath.    [provider]  blood glucose meter kit and supplies Dispense based on patient and insurance preference. Use up to four times daily as directed. (FOR ICD-10 E10.9,  E11.9). 12/07/17   Eugenie Filler, MD  ferrous sulfate 324 (65 Fe) MG TBEC Take 1 tablet by mouth every other day. 02/08/19   [provider]  glucagon (GLUCAGON EMERGENCY) 1 MG injection Inject 1 mg into the muscle once as needed (For very low blood sugars. May repeat in 15-20 minutes if needed.). 12/20/16   Hongalgi, Lenis Dickinson, MD  Insulin Human (INSULIN PUMP) SOLN Inject 0-10 each into the skin continuous. Per sliding scale - Novolog 100 units/ml    [provider]  Insulin Pen Needle 31G X 5 MM MISC 30 Units by Does not apply route at bedtime. 12/07/17   Eugenie Filler, MD  lisinopril (ZESTRIL) 2.5 MG tablet Take 2.5 mg by mouth daily.    [provider]  metoCLOPramide (REGLAN) 10 MG tablet Take 1 tablet (10 mg total) by mouth 3 (three) times daily with meals. 11/18/18 11/18/19  Kathie Dike, MD  pantoprazole (PROTONIX) 40 MG tablet Take 40 mg by mouth daily. 02/08/19  [provider]  promethazine (PHENERGAN) 25 MG suppository Place 1 suppository (25 mg total) rectally every 6 (six) hours as needed (If unable to take oral phenergan). 02/23/19   Mariel Aloe, MD  promethazine (PHENERGAN) 25 MG tablet Take 1 tablet (25 mg total) by mouth every 6 (six) hours as needed for nausea or vomiting. 02/23/19   Mariel Aloe, MD  traZODone (DESYREL) 50 MG tablet Take 50 mg by mouth at bedtime.  11/30/18   [provider]    Allergies    Peanut-containing drug products, Food, and Ultram [tramadol]  Review of Systems   Review of Systems  Gastrointestinal: Positive for abdominal pain and vomiting.  All other systems reviewed and are negative.   Physical Exam Updated Vital Signs Pulse (!) 122   Temp 98.6 F (37 C) (Oral)   Resp 17   Ht 5' 4"  (1.626 m)   Wt 54.4 kg   LMP  (Within Months)   SpO2 100%   BMI 20.60 kg/m   Physical Exam Vitals and nursing note reviewed.  Constitutional:      Appearance: She is well-developed.     Comments:  Patient is squirming around the bed, moving about appears uncomfortable.  HENT:     Head: Atraumatic.  Eyes:     Conjunctiva/sclera: Conjunctivae normal.  Cardiovascular:     Rate and Rhythm: Tachycardia present.  Pulmonary:     Effort: Pulmonary effort is normal.     Breath sounds: Normal breath sounds.  Abdominal:     General: Abdomen is flat.     Palpations: Abdomen is soft.     Tenderness: There is generalized abdominal tenderness (Mild diffuse abdominal tenderness without guarding or rebound tenderness).  Musculoskeletal:     Cervical back: Neck supple.  Skin:    Findings: No rash.  Neurological:     Mental Status: She is alert. Mental status is at baseline.  Psychiatric:        Mood and Affect: Mood normal.     ED Results / Procedures / Treatments   Labs (all labs ordered are listed, but only abnormal results are displayed) Labs Reviewed  CBC WITH DIFFERENTIAL/PLATELET - Abnormal; Notable for the following components:      Result Value   WBC 11.7 (*)    RBC 3.70 (*)    Hemoglobin 9.3 (*)    HCT 30.8 (*)    MCH 25.1 (*)    RDW 15.9 (*)    Platelets 444 (*)    Neutro Abs 9.4 (*)    All other components within normal limits  COMPREHENSIVE METABOLIC PANEL - Abnormal; Notable for the following components:   Glucose, Bld 499 (*)    AST 13 (*)    All other components within normal limits  URINALYSIS, ROUTINE W REFLEX MICROSCOPIC - Abnormal; Notable for the following components:   Color, Urine STRAW (*)    APPearance HAZY (*)    Glucose, UA >=500 (*)    Hgb urine dipstick MODERATE (*)    Ketones, ur 80 (*)    Protein, ur 30 (*)    Leukocytes,Ua LARGE (*)    RBC / HPF >50 (*)    WBC, UA >50 (*)    Bacteria, UA RARE (*)    All other components within normal limits  CBG MONITORING, ED - Abnormal; Notable for the following components:   Glucose-Capillary 437 (*)    All other components within normal limits  CBG MONITORING, ED - Abnormal; Notable for  the following  components:   Glucose-Capillary 243 (*)    All other components within normal limits  URINE CULTURE  LIPASE, BLOOD  RAPID URINE DRUG SCREEN, HOSP PERFORMED  I-STAT BETA HCG BLOOD, ED ([facility], [facility], [facility] ONLY)    EKG None  Radiology No results found.  Procedures Procedures (including critical care time)  Medications Ordered in ED Medications  sodium chloride 0.9 % bolus 1,000 mL (1,000 mLs Intravenous New Bag/Given 02/24/19 1023)  haloperidol lactate (HALDOL) injection 5 mg (5 mg Intravenous Given 02/24/19 1023)  promethazine (PHENERGAN) injection 12.5 mg (12.5 mg Intravenous Given 02/24/19 1025)  insulin aspart (novoLOG) injection 5 Units (5 Units Subcutaneous Given 02/24/19 1030)  cefTRIAXone (ROCEPHIN) 1 g in sodium chloride 0.9 % 100 mL IVPB (0 g Intravenous Stopped 02/24/19 1310)    ED Course  I have reviewed the triage vital signs and the nursing notes.  Pertinent labs & imaging results that were available during my care of the patient were reviewed by me and considered in my medical decision making (see chart for details).    MDM Rules/Calculators/A&P                      BP 118/79   Pulse (!) 109   Temp 98.6 F (37 C) (Oral)   Resp 15   Ht 5' 4"  (1.626 m)   Wt 54.4 kg   LMP  (Within Months)   SpO2 97%   BMI 20.60 kg/m   Final Clinical Impression(s) / ED Diagnoses Final diagnoses:  Intractable vomiting with nausea, unspecified vomiting type    Rx / DC Orders ED Discharge Orders    None     10:02 AM Patient with history of uncontrolled diabetes, gastroparesis, intractable nausea and vomiting here with chronic pain and associate nausea and vomiting since this morning.  Was discharged from the hospital yesterday after 4-day stays for similar complaint.  UA shows large leukocyte esterase with greater than 50 WBC, moderate hemoglobin and urine dipsticks as well as 80 ketones.  She denies any dysuria however giving her significant comorbidity, will obtain urine culture and  will treat for suspected UTI with Rocephin.  1:50 PM CBG improved with IV fluid and with insulin.  When reviewing her previous UA, it appears that this is at her baseline.  Patient does have evidence of dehydration but now she tolerates p.o. and stable to be discharged home.  Urine culture sent.  Return precaution discussed.  No evidence of DKA.   Domenic Moras, PA-C 02/24/19 1351    Lacretia Leigh, MD 02/25/19 [phone]

## 2019-02-24 NOTE — Discharge Instructions (Addendum)
Continue taking your antinausea medication at home as needed and follow up closely with your doctor for further care.  Take your diabetic medications as your blood sugar is high today.

## 2019-02-24 NOTE — ED Triage Notes (Signed)
Patient reports nausea, vomiting and abd pain 10/10 starting this morning.

## 2019-02-26 ENCOUNTER — Encounter (HOSPITAL_COMMUNITY): Payer: Self-pay

## 2019-02-26 ENCOUNTER — Other Ambulatory Visit: Payer: Self-pay

## 2019-02-26 ENCOUNTER — Inpatient Hospital Stay (HOSPITAL_COMMUNITY)
Admission: EM | Admit: 2019-02-26 | Discharge: 2019-03-06 | DRG: 074 | Disposition: A | Discharge status: Home / Self Care | Payer: BC Managed Care – PPO | Attending: Family Medicine | Admitting: Family Medicine

## 2019-02-26 DIAGNOSIS — Z833 Family history of diabetes mellitus: Secondary | ICD-10-CM

## 2019-02-26 DIAGNOSIS — M1990 Unspecified osteoarthritis, unspecified site: Secondary | ICD-10-CM | POA: Diagnosis present

## 2019-02-26 DIAGNOSIS — N319 Neuromuscular dysfunction of bladder, unspecified: Secondary | ICD-10-CM | POA: Diagnosis present

## 2019-02-26 DIAGNOSIS — Z8349 Family history of other endocrine, nutritional and metabolic diseases: Secondary | ICD-10-CM

## 2019-02-26 DIAGNOSIS — R1115 Cyclical vomiting syndrome unrelated to migraine: Secondary | ICD-10-CM | POA: Diagnosis present

## 2019-02-26 DIAGNOSIS — E1043 Type 1 diabetes mellitus with diabetic autonomic (poly)neuropathy: Principal | ICD-10-CM | POA: Diagnosis present

## 2019-02-26 DIAGNOSIS — Z9641 Presence of insulin pump (external) (internal): Secondary | ICD-10-CM | POA: Diagnosis present

## 2019-02-26 DIAGNOSIS — Z91010 Allergy to peanuts: Secondary | ICD-10-CM

## 2019-02-26 DIAGNOSIS — Z20822 Contact with and (suspected) exposure to covid-19: Secondary | ICD-10-CM | POA: Diagnosis present

## 2019-02-26 DIAGNOSIS — K2100 Gastro-esophageal reflux disease with esophagitis, without bleeding: Secondary | ICD-10-CM | POA: Diagnosis present

## 2019-02-26 DIAGNOSIS — Z794 Long term (current) use of insulin: Secondary | ICD-10-CM

## 2019-02-26 DIAGNOSIS — R112 Nausea with vomiting, unspecified: Secondary | ICD-10-CM

## 2019-02-26 DIAGNOSIS — I110 Hypertensive heart disease with heart failure: Secondary | ICD-10-CM | POA: Diagnosis present

## 2019-02-26 DIAGNOSIS — R000 Tachycardia, unspecified: Secondary | ICD-10-CM | POA: Diagnosis present

## 2019-02-26 DIAGNOSIS — E1065 Type 1 diabetes mellitus with hyperglycemia: Secondary | ICD-10-CM | POA: Diagnosis not present

## 2019-02-26 DIAGNOSIS — J45909 Unspecified asthma, uncomplicated: Secondary | ICD-10-CM | POA: Diagnosis present

## 2019-02-26 DIAGNOSIS — E1069 Type 1 diabetes mellitus with other specified complication: Secondary | ICD-10-CM | POA: Diagnosis present

## 2019-02-26 DIAGNOSIS — Z8249 Family history of ischemic heart disease and other diseases of the circulatory system: Secondary | ICD-10-CM

## 2019-02-26 DIAGNOSIS — D509 Iron deficiency anemia, unspecified: Secondary | ICD-10-CM | POA: Diagnosis present

## 2019-02-26 DIAGNOSIS — Z885 Allergy status to narcotic agent status: Secondary | ICD-10-CM

## 2019-02-26 DIAGNOSIS — R160 Hepatomegaly, not elsewhere classified: Secondary | ICD-10-CM | POA: Diagnosis present

## 2019-02-26 DIAGNOSIS — Z809 Family history of malignant neoplasm, unspecified: Secondary | ICD-10-CM

## 2019-02-26 DIAGNOSIS — K3184 Gastroparesis: Secondary | ICD-10-CM | POA: Diagnosis present

## 2019-02-26 DIAGNOSIS — N3011 Interstitial cystitis (chronic) with hematuria: Secondary | ICD-10-CM | POA: Diagnosis present

## 2019-02-26 DIAGNOSIS — I5032 Chronic diastolic (congestive) heart failure: Secondary | ICD-10-CM | POA: Diagnosis present

## 2019-02-26 DIAGNOSIS — D638 Anemia in other chronic diseases classified elsewhere: Secondary | ICD-10-CM | POA: Diagnosis present

## 2019-02-26 DIAGNOSIS — E876 Hypokalemia: Secondary | ICD-10-CM | POA: Diagnosis not present

## 2019-02-26 DIAGNOSIS — K760 Fatty (change of) liver, not elsewhere classified: Secondary | ICD-10-CM | POA: Diagnosis present

## 2019-02-26 LAB — CBC WITH DIFFERENTIAL/PLATELET
Abs Immature Granulocytes: 0.02 10*3/uL (ref 0.00–0.07)
Basophils Absolute: 0 10*3/uL (ref 0.0–0.1)
Basophils Relative: 0 %
Eosinophils Absolute: 0.2 10*3/uL (ref 0.0–0.5)
Eosinophils Relative: 3 %
HCT: 28.4 % — ABNORMAL LOW (ref 36.0–46.0)
Hemoglobin: 8.8 g/dL — ABNORMAL LOW (ref 12.0–15.0)
Immature Granulocytes: 0 %
Lymphocytes Relative: 27 %
Lymphs Abs: 1.9 10*3/uL (ref 0.7–4.0)
MCH: 25.5 pg — ABNORMAL LOW (ref 26.0–34.0)
MCHC: 31 g/dL (ref 30.0–36.0)
MCV: 82.3 fL (ref 80.0–100.0)
Monocytes Absolute: 1 10*3/uL (ref 0.1–1.0)
Monocytes Relative: 13 %
Neutro Abs: 4 10*3/uL (ref 1.7–7.7)
Neutrophils Relative %: 57 %
Platelets: 388 10*3/uL (ref 150–400)
RBC: 3.45 MIL/uL — ABNORMAL LOW (ref 3.87–5.11)
RDW: 16.3 % — ABNORMAL HIGH (ref 11.5–15.5)
WBC: 7.2 10*3/uL (ref 4.0–10.5)
nRBC: 0 % (ref 0.0–0.2)

## 2019-02-26 LAB — COMPREHENSIVE METABOLIC PANEL
ALT: 9 U/L (ref 0–44)
AST: 12 U/L — ABNORMAL LOW (ref 15–41)
Albumin: 3.6 g/dL (ref 3.5–5.0)
Alkaline Phosphatase: 67 U/L (ref 38–126)
Anion gap: 12 (ref 5–15)
BUN: 13 mg/dL (ref 6–20)
CO2: 23 mmol/L (ref 22–32)
Calcium: 8.7 mg/dL — ABNORMAL LOW (ref 8.9–10.3)
Chloride: 100 mmol/L (ref 98–111)
Creatinine, Ser: 0.66 mg/dL (ref 0.44–1.00)
GFR calc Af Amer: >60 mL/min (ref >60)
GFR calc non Af Amer: >60 mL/min (ref >60)
Glucose, Bld: 223 mg/dL — ABNORMAL HIGH (ref 70–99)
Potassium: 3.4 mmol/L — ABNORMAL LOW (ref 3.5–5.1)
Sodium: 135 mmol/L (ref 135–145)
Total Bilirubin: 1.1 mg/dL (ref 0.3–1.2)
Total Protein: 6.5 g/dL (ref 6.5–8.1)

## 2019-02-26 LAB — URINALYSIS, ROUTINE W REFLEX MICROSCOPIC
Bacteria, UA: NONE SEEN
Bilirubin Urine: NEGATIVE
Glucose, UA: >=500 mg/dL — AB
Ketones, ur: 20 mg/dL — AB
Nitrite: NEGATIVE
Protein, ur: 100 mg/dL — AB
RBC / HPF: >50 RBC/hpf — ABNORMAL HIGH (ref 0–5)
Specific Gravity, Urine: 1.01 (ref 1.005–1.030)
WBC, UA: >50 WBC/hpf — ABNORMAL HIGH (ref 0–5)
pH: 6 (ref 5.0–8.0)

## 2019-02-26 LAB — URINE CULTURE: Culture: <10000 — AB

## 2019-02-26 LAB — I-STAT BETA HCG BLOOD, ED (MC, WL, AP ONLY): I-stat hCG, quantitative: <5 m[IU]/mL (ref <5)

## 2019-02-26 LAB — CBG MONITORING, ED
Glucose-Capillary: 63 mg/dL — ABNORMAL LOW (ref 70–99)
Glucose-Capillary: 101 mg/dL — ABNORMAL HIGH (ref 70–99)

## 2019-02-26 LAB — GLUCOSE, CAPILLARY
Glucose-Capillary: 174 mg/dL — ABNORMAL HIGH (ref 70–99)
Glucose-Capillary: 50 mg/dL — ABNORMAL LOW (ref 70–99)

## 2019-02-26 LAB — LIPASE, BLOOD: Lipase: 15 U/L (ref 11–51)

## 2019-02-26 MED ORDER — DICYCLOMINE HCL 10 MG PO CAPS
10.0000 mg | ORAL_CAPSULE | Freq: Once | ORAL | Status: AC
  Administered 2019-02-26: 10 mg via ORAL
  Filled 2019-02-26: qty 1

## 2019-02-26 MED ORDER — KETOROLAC TROMETHAMINE 15 MG/ML IJ SOLN
15.0000 mg | Freq: Four times a day (QID) | INTRAMUSCULAR | Status: AC | PRN
  Administered 2019-02-26 – 2019-03-01 (×3): 15 mg via INTRAVENOUS
  Filled 2019-02-26 (×3): qty 1

## 2019-02-26 MED ORDER — SENNA 8.6 MG PO TABS
1.0000 | ORAL_TABLET | Freq: Two times a day (BID) | ORAL | Status: DC
  Administered 2019-02-26 – 2019-03-06 (×10): 8.6 mg via ORAL
  Filled 2019-02-26 (×14): qty 1

## 2019-02-26 MED ORDER — KETOROLAC TROMETHAMINE 30 MG/ML IJ SOLN
15.0000 mg | Freq: Once | INTRAMUSCULAR | Status: AC
  Administered 2019-02-26: 15 mg via INTRAVENOUS
  Filled 2019-02-26: qty 1

## 2019-02-26 MED ORDER — ACETAMINOPHEN 325 MG PO TABS
650.0000 mg | ORAL_TABLET | Freq: Four times a day (QID) | ORAL | Status: DC | PRN
  Filled 2019-02-26: qty 2

## 2019-02-26 MED ORDER — METOCLOPRAMIDE HCL 10 MG PO TABS: 10.0000 mg | ORAL_TABLET | Freq: Four times a day (QID) | ORAL | 0 refills | Status: DC

## 2019-02-26 MED ORDER — DEXTROSE 50 % IV SOLN
25.0000 g | INTRAVENOUS | Status: AC
  Administered 2019-02-26: 25 g via INTRAVENOUS

## 2019-02-26 MED ORDER — INSULIN ASPART 100 UNIT/ML ~~LOC~~ SOLN
0.0000 [IU] | Freq: Every day | SUBCUTANEOUS | Status: DC
  Administered 2019-02-27: 2 [IU] via SUBCUTANEOUS
  Filled 2019-02-26: qty 0.05

## 2019-02-26 MED ORDER — SODIUM CHLORIDE 0.9 % IV SOLN
8.0000 mg | Freq: Once | INTRAVENOUS | Status: AC
  Administered 2019-02-26: 8 mg via INTRAVENOUS
  Filled 2019-02-26: qty 4

## 2019-02-26 MED ORDER — INSULIN DETEMIR 100 UNIT/ML ~~LOC~~ SOLN
10.0000 [IU] | Freq: Two times a day (BID) | SUBCUTANEOUS | Status: DC
  Filled 2019-02-26 (×2): qty 0.1

## 2019-02-26 MED ORDER — METOCLOPRAMIDE HCL 5 MG/ML IJ SOLN
10.0000 mg | Freq: Once | INTRAMUSCULAR | Status: AC
  Administered 2019-02-26: 10 mg via INTRAVENOUS
  Filled 2019-02-26: qty 2

## 2019-02-26 MED ORDER — INSULIN ASPART 100 UNIT/ML ~~LOC~~ SOLN
0.0000 [IU] | Freq: Three times a day (TID) | SUBCUTANEOUS | Status: DC
  Administered 2019-02-27: 12:00:00 2 [IU] via SUBCUTANEOUS
  Administered 2019-02-27: 3 [IU] via SUBCUTANEOUS
  Administered 2019-02-28: 1 [IU] via SUBCUTANEOUS
  Administered 2019-02-28: 5 [IU] via SUBCUTANEOUS
  Administered 2019-03-01: 7 [IU] via SUBCUTANEOUS
  Filled 2019-02-26: qty 0.09

## 2019-02-26 MED ORDER — HYDROMORPHONE HCL 1 MG/ML IJ SOLN
1.0000 mg | Freq: Once | INTRAMUSCULAR | Status: AC
  Administered 2019-02-26: 1 mg via INTRAVENOUS
  Filled 2019-02-26: qty 1

## 2019-02-26 MED ORDER — ONDANSETRON HCL 4 MG PO TABS: 4.0000 mg | ORAL_TABLET | Freq: Four times a day (QID) | ORAL | Status: DC | PRN

## 2019-02-26 MED ORDER — DEXTROSE 50 % IV SOLN
INTRAVENOUS | Status: AC
  Filled 2019-02-26: qty 50

## 2019-02-26 MED ORDER — SODIUM CHLORIDE 0.9 % IV SOLN
INTRAVENOUS | Status: DC
  Administered 2019-02-26 – 2019-02-27 (×2): 1000 mL via INTRAVENOUS

## 2019-02-26 MED ORDER — MAGNESIUM HYDROXIDE 400 MG/5ML PO SUSP: 30.0000 mL | Freq: Every day | ORAL | Status: DC | PRN

## 2019-02-26 MED ORDER — ONDANSETRON HCL 4 MG/2ML IJ SOLN
4.0000 mg | Freq: Four times a day (QID) | INTRAMUSCULAR | Status: DC | PRN
  Administered 2019-02-26 – 2019-02-27 (×2): 4 mg via INTRAVENOUS
  Filled 2019-02-26 (×2): qty 2

## 2019-02-26 MED ORDER — ENOXAPARIN SODIUM 40 MG/0.4ML ~~LOC~~ SOLN
40.0000 mg | SUBCUTANEOUS | Status: DC
  Administered 2019-03-01 – 2019-03-05 (×4): 40 mg via SUBCUTANEOUS
  Filled 2019-02-26 (×8): qty 0.4

## 2019-02-26 MED ORDER — METOCLOPRAMIDE HCL 5 MG/ML IJ SOLN
10.0000 mg | Freq: Three times a day (TID) | INTRAMUSCULAR | Status: DC | PRN
  Administered 2019-02-26 – 2019-02-27 (×2): 10 mg via INTRAVENOUS
  Filled 2019-02-26 (×2): qty 2

## 2019-02-26 MED ORDER — LORAZEPAM 2 MG/ML IJ SOLN
1.0000 mg | Freq: Once | INTRAMUSCULAR | Status: AC
  Administered 2019-02-26: 1 mg via INTRAVENOUS
  Filled 2019-02-26: qty 1

## 2019-02-26 MED ORDER — SODIUM CHLORIDE 0.9% FLUSH
3.0000 mL | Freq: Two times a day (BID) | INTRAVENOUS | Status: DC
  Administered 2019-02-26 – 2019-03-04 (×11): 3 mL via INTRAVENOUS

## 2019-02-26 MED ORDER — SORBITOL 70 % SOLN: 30.0000 mL | Freq: Every day | Status: DC | PRN

## 2019-02-26 MED ORDER — POTASSIUM CHLORIDE CRYS ER 20 MEQ PO TBCR
40.0000 meq | EXTENDED_RELEASE_TABLET | Freq: Once | ORAL | Status: AC
  Administered 2019-02-26: 40 meq via ORAL
  Filled 2019-02-26: qty 2

## 2019-02-26 MED ORDER — PROMETHAZINE HCL 25 MG/ML IJ SOLN
25.0000 mg | Freq: Once | INTRAMUSCULAR | Status: AC
  Administered 2019-02-26: 25 mg via INTRAVENOUS
  Filled 2019-02-26: qty 1

## 2019-02-26 MED ORDER — KETOROLAC TROMETHAMINE 30 MG/ML IJ SOLN
30.0000 mg | Freq: Once | INTRAMUSCULAR | Status: AC
  Administered 2019-02-26: 30 mg via INTRAVENOUS
  Filled 2019-02-26: qty 1

## 2019-02-26 MED ORDER — ACETAMINOPHEN 650 MG RE SUPP: 650.0000 mg | Freq: Four times a day (QID) | RECTAL | Status: DC | PRN

## 2019-02-26 MED ORDER — FLEET ENEMA 7-19 GM/118ML RE ENEM: 1.0000 | ENEMA | Freq: Once | RECTAL | Status: DC | PRN

## 2019-02-26 NOTE — ED Notes (Signed)
Pt and all belongings transported upstairs.  

## 2019-02-26 NOTE — ED Notes (Signed)
Patient is aware that urine sample is needed. 

## 2019-02-26 NOTE — ED Notes (Addendum)
Patient called out to report that she had thrown up after drinking water

## 2019-02-26 NOTE — ED Notes (Signed)
Pt unable to tolerate liquid PO. Small green emesis in bag.

## 2019-02-26 NOTE — H&P (Signed)
[facility] History and Physical  [address] ZOX:[id number] DOB: 29-Aug-1990 DOA: 02/26/2019 Referring physician: ED PCP: Vicenta Aly, FNP  Chief Complaint: Intractable nausea and vomiting Came from: Home ------------------------------------------------------------------------------------------------------ Assessment/Plan: Active Problems:   Intractable cyclical vomiting with nausea  Intractable cyclic vomiting with nausea -Has history of the same.  Most likely related to gastroparesis. -Multiple ER visits and hospitalization related to the same. -Also has a tendency of IV opiate seeking behavior.   -Monitor in observation overnight. -Avoid IV pain opiate.  Order for IV Toradol as needed. -I ordered for IV Reglan as needed. -Normal saline at 100 mL/h -Clear liquid diet for now.  Advance as tolerated.  Type 1 diabetes mellitus History of multiple episodes of DKA -Last A1c 7.8 on 1/30.  On insulin pump -Blood sugar mostly more than 200. -At this time, I started the patient on Lantus 10 units twice daily along with sliding scale insulin. -Follows up with endocrinologist as an outpatient.  Hypertension -Continue lisinopril.  Monitor blood pressure. -There is only one reading of blood pressure more than 190s in the ED, patient will because of vomiting.  Otherwise blood pressure is stable.  Neurogenic bladder -Patient performs CIC every 8 hours.  Abnormal urinalysis  -Urinalysis showed turbid urine with large amount of leukocytes and WBC clumps.   -She had similar UA in last admission. -Urine culture from last admission showed less than 10,000 CFU per mL of growth. -No fever, WBC count normal.  I would avoid antibiotics at this time.  Chronic disease anemia -Baseline hemoglobin 7-8. -Continue iron supplement  Mobility: Encourage ambulation Diet: Clear liquid diet for now DVT prophylaxis:  Lovenox subcu Code Status:  Full code Family Communication:  None at  bedside Disposition Plan:  Observation overnight.  Anticipate discharge home tomorrow if symptoms improve.  ----------------------------------------------------------------------------------------------------- History of Present Illness: Patient is a 29 year old female with PMH significant for T1DM on insulin pump, recurrent DKA, gastroparesis, intractable cyclic vomiting, multiple hospitalizations for the same, HTN, GERD, asthma. She was recently hospitalized 1/30-2/4 for similar complaint of intractable vomiting leading to DKA. Patient returns back to the ED again with multiple episodes of vomiting and inability to tolerate oral intake. Chart reviewed and case discussed with ED staff. Patient presented multiple times in the ED here at [facility] as well as [facility] and prefers IV pain medicines.  In the ED today, patient was afebrile, heart rate mostly 100, blood pressure mostly controlled in the 140s, breathing on room air. Blood sugar level in 200s Work-up showed sodium level 135, potassium 3.4, BUN/creatinine 13/0.66. WBC count normal at 7.5, hemoglobin low at 8.8 (baseline 7-8), platelet 388. Urinalysis showed turbid urine with large amount of leukocytes and WBC clumps. Urine culture from last admission showed less than 10,000 CFU per mL of growth.  Hospitalist service was called for further evaluation and management. At the time of my evaluation, patient was in her bed.  Alert, awake, oriented x3.  She was rocking constantly back-and-forth saying that she was in pain.  She is asking for IV Phenergan and IV Dilaudid.  Does not look dehydrated.  Review of Systems:  All systems were reviewed and were negative unless otherwise mentioned in the HPI   Past medical history: Past Medical History:  Diagnosis Date  . Anxiety   . Arthritis    "hands, feet, knees" (12/18/2016)  . Asthma   . Diabetic gastroparesis ([place])    Per gastric emptying study 07/09/16 which showed  significant delayed gastric  emptying.  . Gallstones   . Gastroparesis   . GERD (gastroesophageal reflux disease)   . Heart murmur   . Hepatic steatosis 11/26/2014   and hepatomegaly  . Hypertension    hx (12/18/2016)  . Intractable cyclical vomiting syndrome    Archie Endo 12/18/2016  . Liver mass 11/26/2014  . Pancreatitis, acute 11/26/2014  . Pneumonia    "as a teen X 1" (12/18/2016)  . Type I diabetes mellitus ([place]) 2007   IDDM.  poorly controlled, multiple admits with DKA    Past surgical history: Past Surgical History:  Procedure Laterality Date  . CHOLECYSTECTOMY N/A 02/11/2015   Procedure: LAPAROSCOPIC CHOLECYSTECTOMY WITH INTRAOPERATIVE CHOLANGIOGRAM;  Surgeon: Greer Pickerel, MD;  Location: [facility] ORS;  Service: General;  Laterality: N/A;  . ESOPHAGOGASTRODUODENOSCOPY (EGD) WITH PROPOFOL Left 09/20/2014   Procedure: ESOPHAGOGASTRODUODENOSCOPY (EGD) WITH PROPOFOL;  Surgeon: Arta Silence, MD;  Location: [facility] ENDOSCOPY;  Service: Endoscopy;  Laterality: Left;  . WISDOM TOOTH EXTRACTION      Social History:  reports that she has never smoked. She has never used smokeless tobacco. She reports previous drug use. Drug: Marijuana. She reports that she does not drink alcohol.  Allergies:  Allergies  Allergen Reactions  . Peanut-Containing Drug Products Swelling and Other (See Comments)    Reaction:  Swelling of mouth and lips   . 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   Reaction:  Swelling of mouth and lips   . Ultram [Tramadol] Itching    Family history:  Family History  Problem Relation Age of Onset  . Heart disease Maternal Grandmother   . Heart disease Maternal Grandfather   . Diabetes Mother   . Hyperlipidemia Mother   . Hypertension Father   . Heart disease Father   . Hypertension Paternal Grandmother   . Cancer Paternal Grandfather      Home Meds: Prior to Admission medications   Medication Sig Start Date End Date Taking? Authorizing  Provider  albuterol (PROVENTIL HFA;VENTOLIN HFA) 108 (90 Base) MCG/ACT inhaler Inhale 1-2 puffs into the lungs every 6 (six) hours as needed for wheezing or shortness of breath.    [provider]  blood glucose meter kit and supplies Dispense based on patient and insurance preference. Use up to four times daily as directed. (FOR ICD-10 E10.9, E11.9). 12/07/17   Eugenie Filler, MD  ferrous sulfate 324 (65 Fe) MG TBEC Take 1 tablet by mouth every other day. 02/08/19   [provider]  glucagon (GLUCAGON EMERGENCY) 1 MG injection Inject 1 mg into the muscle once as needed (For very low blood sugars. May repeat in 15-20 minutes if needed.). 12/20/16   Hongalgi, Lenis Dickinson, MD  Insulin Human (INSULIN PUMP) SOLN Inject 0-10 each into the skin continuous. Per sliding scale - Novolog 100 units/ml    [provider]  Insulin Pen Needle 31G X 5 MM MISC 30 Units by Does not apply route at bedtime. 12/07/17   Eugenie Filler, MD  lisinopril (ZESTRIL) 2.5 MG tablet Take 2.5 mg by mouth daily.    [provider]  metoCLOPramide (REGLAN) 10 MG tablet Take 1 tablet (10 mg total) by mouth every 6 (six) hours. 02/26/19   Couture, Cortni S, PA-C  pantoprazole (PROTONIX) 40 MG tablet Take 40 mg by mouth daily. 02/08/19   [provider]  promethazine (PHENERGAN) 25 MG suppository Place 1 suppository (25 mg total) rectally every 6 (six) hours as needed (If unable to  take oral phenergan). 02/23/19   Mariel Aloe, MD  promethazine (PHENERGAN) 25 MG tablet Take 1 tablet (25 mg total) by mouth every 6 (six) hours as needed for nausea or vomiting. 02/23/19   Mariel Aloe, MD  traZODone (DESYREL) 50 MG tablet Take 50 mg by mouth at bedtime.  11/30/18   [provider]    Physical Exam: Vitals:   02/26/19 1315 02/26/19 1403 02/26/19 1415 02/26/19 1430  BP:  (!) 136/93  (!) 192/116  Pulse:  (!) 121  99  Resp:  18    Temp:      TempSrc:      SpO2: 100% 100% 100%    Weight:      Height:       Wt Readings from Last 3 Encounters:  02/26/19 54.4 kg  02/24/19 54.4 kg  02/20/19 54.4 kg   Body mass index is 20.6 kg/m.  General exam: Does not look dehydrated.  Rocking back and forth in bed, states she is in pain Skin: No rashes, lesions or ulcers. HEENT: Atraumatic, normocephalic, supple neck, no obvious bleeding Lungs: Clear to auscultation bilaterally CVS: Regular rate and rhythm, no murmur GI/Abd soft, nondistended, mild tenderness diffuse, not consistent on multiple examination, bowel sound present CNS: Alert, awake, oriented x3 Psychiatry: Depressed look Extremities: No pedal edema, no calf tenderness     Consult Orders  (From admission, onward)         Start     Ordered   02/26/19 1628  Consult to hospitalist  ALL PATIENTS BEING ADMITTED/HAVING PROCEDURES NEED COVID-19 SCREENING  Once    Comments: ALL PATIENTS BEING ADMITTED/HAVING PROCEDURES NEED COVID-19 SCREENING  Provider:  (Not yet assigned)  Question Answer Comment  Place call to: [facility]   Reason for Consult Admit      02/26/19 1627          Labs on Admission:   CBC: Recent Labs  Lab 02/20/19 0436 02/22/19 0508 02/23/19 0428 02/24/19 0954 02/26/19 0958  WBC 7.3 6.4 6.4 11.7* 7.2  NEUTROABS  --   --   --  9.4* 4.0  HGB 7.4* 7.9* 7.6* 9.3* 8.8*  HCT 24.5* 25.7* 25.1* 30.8* 28.4*  MCV 84.2 83.4 83.1 83.2 82.3  PLT 334 376 365 444* 270    Basic Metabolic Panel: Recent Labs  Lab 02/20/19 0436 02/22/19 0508 02/23/19 0428 02/24/19 0954 02/26/19 0958  NA 138 138 138 137 135  K 3.8 3.8 3.4* 4.2 3.4*  CL 108 103 103 101 100  CO2 _0 GLUCOSE 150* 324* 235* 499* 223*  BUN _1 CREATININE 0.66 0.66 0.61 0.85 0.66  CALCIUM 8.2* 8.3* 8.6* 9.4 8.7*    Liver Function Tests: Recent Labs  Lab 02/24/19 0954 02/26/19 0958  AST 13* 12*  ALT 10 9  ALKPHOS 77 67  BILITOT 1.1 1.1  PROT 7.2 6.5  ALBUMIN 3.9 3.6   Recent Labs   Lab 02/24/19 0954 02/26/19 0958  LIPASE 14 15   No results for input(s): AMMONIA in the last 168 hours.  Cardiac Enzymes: No results for input(s): CKTOTAL, CKMB, CKMBINDEX, TROPONINI in the last 168 hours.  BNP (last 3 results) No results for input(s): BNP in the last 8760 hours.  ProBNP (last 3 results) No results for input(s): PROBNP in the last 8760 hours.  CBG: Recent Labs  Lab 02/23/19 0749 02/23/19 1152 02/24/19 0926 02/24/19 1312 02/26/19 1722  GLUCAP 171* 351* 437*  243* 63*    Lipase     Component Value Date/Time   LIPASE 15 02/26/2019 0958     Urinalysis    Component Value Date/Time   COLORURINE YELLOW 02/26/2019 1130   APPEARANCEUR TURBID (A) 02/26/2019 1130   LABSPEC 1.010 02/26/2019 1130   PHURINE 6.0 02/26/2019 1130   GLUCOSEU >=500 (A) 02/26/2019 1130   GLUCOSEU >=1000 11/07/2012 1205   HGBUR MODERATE (A) 02/26/2019 1130   BILIRUBINUR NEGATIVE 02/26/2019 1130   KETONESUR 20 (A) 02/26/2019 1130   PROTEINUR 100 (A) 02/26/2019 1130   UROBILINOGEN 0.2 11/24/2014 1045   NITRITE NEGATIVE 02/26/2019 1130   LEUKOCYTESUR LARGE (A) 02/26/2019 1130     Drugs of Abuse     Component Value Date/Time   LABOPIA NONE DETECTED 02/24/2019 0953   COCAINSCRNUR NONE DETECTED 02/24/2019 0953   LABBENZ NONE DETECTED 02/24/2019 0953   AMPHETMU NONE DETECTED 02/24/2019 0953   THCU NONE DETECTED 02/24/2019 0953   LABBARB NONE DETECTED 02/24/2019 0953      Radiological Exams on Admission: No results found. ----------------------------------------------------------------------------------------------------------------------------------------------------------- Severity of Illness: The appropriate patient status for this patient is OBSERVATION. Observation status is judged to be reasonable and necessary in order to provide the required intensity of service to ensure the patient's safety. The patient's presenting symptoms, physical exam findings, and initial  radiographic and laboratory data in the context of their medical condition is felt to place them at decreased risk for further clinical deterioration. Furthermore, it is anticipated that the patient will be medically stable for discharge from the hospital within 2 midnights of admission. The following factors support the patient status of observation.   " The patient's presenting symptoms include intractable nausea vomiting. " The physical exam findings include tender abdomen. " The initial radiographic and laboratory data are elevated blood glucose level.    Signed, Terrilee Croak, MD [facility] 02/26/2019

## 2019-02-26 NOTE — Progress Notes (Signed)
Hypoglycemic Event  CBG: 50  Treatment: Amp Dextrose  Symptoms: Vomiting  Follow-up CBG: Time:2222 CBG Result:174  Possible Reasons for Event: DM type 1 and gastroparesis  Comments/MD notified:MD notified    Roderick Pee

## 2019-02-26 NOTE — ED Notes (Signed)
Pt ambulatory to RR independently. Pt provided ginger ale

## 2019-02-26 NOTE — ED Notes (Signed)
Called to give report floor is receiving an admission atm will follow up

## 2019-02-26 NOTE — ED Notes (Signed)
Pt requests to speak to PA about pain management. PA Courtni made aware. This RN awaiting medications from pharmacy.

## 2019-02-26 NOTE — ED Triage Notes (Signed)
Patient c/o abdominal pain and N/V x 2 days.

## 2019-02-26 NOTE — Progress Notes (Signed)
Just now made aware of yellow MEWs score. This is a new admission from ED. Patient has elevated BP and Pulse. She is in pain and has been medicated. Will be rechecking vitals now that patient has been medicated for pain. Will monitor MEWS score and will let MD know.    Vital Signs MEWS/VS Documentation      02/26/2019 1403 02/26/2019 1430 02/26/2019 1804 02/26/2019 1853   MEWS Score:  2  1  0  2   MEWS Score Color:  Yellow  Green  Green  Yellow   Resp:  18  --  18  14   Pulse:  (!) 121  99  100  (!) 124   BP:  (!) 136/93  (!) 192/116  112/90  (!) 138/107   Temp:  --  --  --  98.8 F (37.1 C)   O2 Device:  Room Air  --  Room [employer]           Rocco Serene M 02/26/2019,8:07 PM

## 2019-02-26 NOTE — ED Notes (Signed)
Pt ambulatory to RR independently  

## 2019-02-26 NOTE — ED Notes (Signed)
Urine culture sent down to lab with urinalysis. 

## 2019-02-26 NOTE — ED Notes (Signed)
ED Pyxis out of Bentyl med called pharmacy to send up dose

## 2019-02-26 NOTE — ED Notes (Signed)
Pt provided apple juice and pack of sugar for glucose

## 2019-02-26 NOTE — Progress Notes (Signed)
BP and Pulse are starting to trend down. MD was notified regarding yellow MEWs. Will continue to follow patient closely.   Vital Signs MEWS/VS Documentation      02/26/2019 1430 02/26/2019 1804 02/26/2019 1853 02/26/2019 2032   MEWS Score:  1  0  2  1   MEWS Score Color:  Green  Green  Yellow  Green   Resp:  --  18  14  14    Pulse:  99  100  (!) 124  (!) 109   BP:  (!) 192/116  112/90  (!) 138/107  (!) 135/102   Temp:  --  --  98.8 F (37.1 C)  98.9 F (37.2 C)   O2 Device:  --  Room [employer]           Rocco Serene M 02/26/2019,9:18 PM

## 2019-02-26 NOTE — ED Provider Notes (Signed)
[facility] DEPT Provider Note   CSN: [id number] Arrival date & time: 02/26/19  1696     History Chief Complaint  Patient presents with   Abdominal Pain   Emesis    [name] is a 29 y.o. female.  HPI   29 year old female with history of anxiety, arthritis, asthma, T1DM, diabetic gastroparesis, GERD, cyclic vomiting syndrome, pancreatitis, who presents to the emergency department today complaining of nausea and vomiting that has been present for the last 2 days.  States she cannot keep anything down.  Emesis has been nonbloody.  She also reports diffuse abdominal pain.  She denies any diarrhea, constipation, fevers, dysuria, frequency or urgency.  States symptoms feel similar to when she has had gastroparesis in the past.  She has Zofran and Phenergan suppositories at home which have not improved her symptoms.  Reviewed records.  Patient seen in the ED multiple times for similar presentations.  She was just seen in the ED 2 days ago and was discharged after having resolution of her vomiting and reassuring work-up.  Past Medical History:  Diagnosis Date   Anxiety    Arthritis    "hands, feet, knees" (12/18/2016)   Asthma    Diabetic gastroparesis ([place])    Per gastric emptying study 07/09/16 which showed significant delayed gastric emptying.   Gallstones    Gastroparesis    GERD (gastroesophageal reflux disease)    Heart murmur    Hepatic steatosis 11/26/2014   and hepatomegaly   Hypertension    hx (12/18/2016)   Intractable cyclical vomiting syndrome    /notes 12/18/2016   Liver mass 11/26/2014   Pancreatitis, acute 11/26/2014   Pneumonia    "as a teen X 1" (12/18/2016)   Type I diabetes mellitus ([place]) 2007   IDDM.  poorly controlled, multiple admits with DKA    Patient Active Problem List   Diagnosis Date Noted   Gastroparesis due to secondary diabetes ([place]) 11/21/2018   Diabetes mellitus ([place]) 11/13/2018   HTN  (hypertension) 11/13/2018   Marijuana abuse 11/13/2018   Acute pyelonephritis 10/07/2018   Hydronephrosis of right kidney 10/05/2018   Diffuse pain    Elevated blood pressure reading 12/03/2017   Tachycardia 11/30/2017   Hypernatremia 11/02/2017   Nausea and vomiting 01/22/2017   Nausea & vomiting 01/21/2017   Intractable cyclical vomiting syndrome 12/18/2016   Leukocytosis 10/24/2016   N&V (nausea and vomiting) 10/23/2016   DKA, type 1 ([place]) 09/26/2016   Thrombocytosis ([place]) 07/08/2016   Candiduria, asymptomatic 06/23/2016   Hyponatremia 06/13/2016   Hematuria 05/13/2016   UTI (urinary tract infection) 01/22/2016   Diarrhea 11/09/2015   Acute urinary retention    Gastroparesis due to DM ([place]) 07/10/2015   GERD (gastroesophageal reflux disease) 07/10/2015   Depression with anxiety 07/10/2015   Gastroparesis 06/22/2015   Altered mental status 06/22/2015   Volume depletion 06/10/2015   Protein-calorie malnutrition, severe 06/10/2015   Hyperglycemia    Elevated bilirubin    Hematemesis with nausea    Intractable nausea and vomiting 05/28/2015   Abdominal pain in female    Abdominal pain 05/24/2015   Hypertension 05/24/2015   Dehydration    Diabetic gastroparesis ([facility])    Chronic diastolic heart failure ([place]) 04/11/2015   Hematemesis 04/08/2015   DKA (diabetic ketoacidoses) ([place]) 03/22/2015   S/P laparoscopic cholecystectomy 02/11/2015   Postextubation stridor    Pancreatitis, acute 11/26/2014   Volume overload 11/26/2014   Hypokalemia 11/26/2014   Hepatic steatosis 11/26/2014   Liver mass  11/26/2014   Sepsis ([place]) 11/25/2014   Sinus tachycardia 11/25/2014   Hypomagnesemia 11/25/2014   Hypophosphatemia 11/25/2014   Elevated LFTs 11/24/2014   AKI (acute kidney injury) ([place]) 11/24/2014   Migraine headache 11/24/2014   Asthma 06/29/2012   Uncontrolled type 1 diabetes mellitus ([place]) 06/19/2010   Goiter,  unspecified 06/19/2010    Past Surgical History:  Procedure Laterality Date   CHOLECYSTECTOMY N/A 02/11/2015   Procedure: LAPAROSCOPIC CHOLECYSTECTOMY WITH INTRAOPERATIVE CHOLANGIOGRAM;  Surgeon: Greer Pickerel, MD;  Location: [facility];  Service: General;  Laterality: N/A;   ESOPHAGOGASTRODUODENOSCOPY (EGD) WITH PROPOFOL Left 09/20/2014   Procedure: ESOPHAGOGASTRODUODENOSCOPY (EGD) WITH PROPOFOL;  Surgeon: Arta Silence, MD;  Location: [facility] ENDOSCOPY;  Service: Endoscopy;  Laterality: Left;   WISDOM TOOTH EXTRACTION       OB History    Gravida  2   Para  1   Term  0   Preterm  1   AB  1   Living  1     SAB  0   TAB  1   Ectopic  0   Multiple  0   Live Births  1           Family History  Problem Relation Age of Onset   Heart disease Maternal Grandmother    Heart disease Maternal Grandfather    Diabetes Mother    Hyperlipidemia Mother    Hypertension Father    Heart disease Father    Hypertension Paternal Grandmother    Cancer Paternal Grandfather     Social History   Tobacco Use   Smoking status: Never Smoker   Smokeless tobacco: Never Used  Substance Use Topics   Alcohol use: No   Drug use: Not Currently    Types: Marijuana    Home Medications Prior to Admission medications   Medication Sig Start Date End Date Taking? Authorizing Provider  albuterol (PROVENTIL HFA;VENTOLIN HFA) 108 (90 Base) MCG/ACT inhaler Inhale 1-2 puffs into the lungs every 6 (six) hours as needed for wheezing or shortness of breath.    [provider]  blood glucose meter kit and supplies Dispense based on patient and insurance preference. Use up to four times daily as directed. (FOR ICD-10 E10.9, E11.9). 12/07/17   Eugenie Filler, MD  ferrous sulfate 324 (65 Fe) MG TBEC Take 1 tablet by mouth every other day. 02/08/19   [provider]  glucagon (GLUCAGON EMERGENCY) 1 MG injection Inject 1 mg into the muscle once as needed (For very low blood  sugars. May repeat in 15-20 minutes if needed.). 12/20/16   Hongalgi, Lenis Dickinson, MD  Insulin Human (INSULIN PUMP) SOLN Inject 0-10 each into the skin continuous. Per sliding scale - Novolog 100 units/ml    [provider]  Insulin Pen Needle 31G X 5 MM MISC 30 Units by Does not apply route at bedtime. 12/07/17   Eugenie Filler, MD  lisinopril (ZESTRIL) 2.5 MG tablet Take 2.5 mg by mouth daily.    [provider]  metoCLOPramide (REGLAN) 10 MG tablet Take 1 tablet (10 mg total) by mouth every 6 (six) hours. 02/26/19   Linsy Ehresman S, PA-C  pantoprazole (PROTONIX) 40 MG tablet Take 40 mg by mouth daily. 02/08/19   [provider]  promethazine (PHENERGAN) 25 MG suppository Place 1 suppository (25 mg total) rectally every 6 (six) hours as needed (If unable to take oral phenergan). 02/23/19   Mariel Aloe, MD  promethazine (PHENERGAN) 25 MG tablet Take 1 tablet (  25 mg total) by mouth every 6 (six) hours as needed for nausea or vomiting. 02/23/19   Mariel Aloe, MD  traZODone (DESYREL) 50 MG tablet Take 50 mg by mouth at bedtime.  11/30/18   [provider]    Allergies    Peanut-containing drug products, Food, and Ultram [tramadol]  Review of Systems   Review of Systems  Constitutional: Negative for fever.  HENT: Negative for ear pain and sore throat.   Eyes: Negative for visual disturbance.  Respiratory: Negative for cough and shortness of breath.   Cardiovascular: Negative for chest pain.  Gastrointestinal: Positive for abdominal pain, nausea and vomiting. Negative for constipation and diarrhea.  Genitourinary: Negative for dysuria, frequency, hematuria and urgency.  Musculoskeletal: Negative for back pain.  Skin: Negative for color change and rash.  Neurological: Negative for headaches.  All other systems reviewed and are negative.   Physical Exam Updated Vital Signs BP (!) 192/116    Pulse 99    Temp 98.5 F (36.9 C) (Oral)    Resp 18    Ht _0   (1.626 m)    Wt 54.4 kg    LMP 02/26/2019    SpO2 100%    BMI 20.60 kg/m   Physical Exam Vitals and nursing note reviewed.  Constitutional:      General: She is not in acute distress.    Appearance: She is well-developed.  HENT:     Head: Normocephalic and atraumatic.  Eyes:     Conjunctiva/sclera: Conjunctivae normal.  Cardiovascular:     Rate and Rhythm: Normal rate and regular rhythm.     Heart sounds: Normal heart sounds. No murmur.  Pulmonary:     Effort: Pulmonary effort is normal. No respiratory distress.     Breath sounds: Normal breath sounds. No wheezing, rhonchi or rales.  Abdominal:     General: Bowel sounds are normal.     Palpations: Abdomen is soft.     Tenderness: There is no right CVA tenderness or left CVA tenderness.     Comments: Pt reporting generalized TTP, but she has no obvious discomfort during abd exam. No focal areas of TTP. No rebound, rigidity, guarding.   Musculoskeletal:     Cervical back: Neck supple.  Skin:    General: Skin is warm and dry.  Neurological:     Mental Status: She is alert.     Comments: Alert, moving all extremities      ED Results / Procedures / Treatments   Labs (all labs ordered are listed, but only abnormal results are displayed) Labs Reviewed  CBC WITH DIFFERENTIAL/PLATELET - Abnormal; Notable for the following components:      Result Value   RBC 3.45 (*)    Hemoglobin 8.8 (*)    HCT 28.4 (*)    MCH 25.5 (*)    RDW 16.3 (*)    All other components within normal limits  COMPREHENSIVE METABOLIC PANEL - Abnormal; Notable for the following components:   Potassium 3.4 (*)    Glucose, Bld 223 (*)    Calcium 8.7 (*)    AST 12 (*)    All other components within normal limits  URINALYSIS, ROUTINE W REFLEX MICROSCOPIC - Abnormal; Notable for the following components:   APPearance TURBID (*)    Glucose, UA >=500 (*)    Hgb urine dipstick MODERATE (*)    Ketones, ur 20 (*)    Protein, ur 100 (*)    Leukocytes,Ua LARGE  (*)  RBC / HPF >50 (*)    WBC, UA >50 (*)    All other components within normal limits  URINE CULTURE  SARS CORONAVIRUS 2 (TAT 6-24 HRS)  LIPASE, BLOOD  I-STAT BETA HCG BLOOD, ED ([facility], [facility], [facility] ONLY)    EKG EKG Interpretation  Date/Time:  _59  year old female with history of P3WU, cyclic vomiting syndrome, presenting for nausea, vomiting, abdominal pain.  Has had many presentations to the ED for similar symptoms.  Of note seen 2 days ago and had reassuring work-up and was discharged.  She  presents today with tachycardia, some hypertension, but otherwise she is afebrile with normal respiratory status.  Her exam is benign and her abdomen is revealing no peritoneal signs.  Will obtain labs and give fluids, antiemetics and pain medications.  CBC without leukocytosis.  Anemia present, grossly stable from prior. CMP with mild hypokalemia.  Elevated blood glucose at 223 but normal bicarb and no elevated anion gap.  Kidney function and liver function are normal.  BUN is notably normal. Lipase negative Beta-hCG negative UA with hematuria, ketonuria, proteinuria, large leukocytes, greater than 50 RBCs, greater than 50 WBCs, bacteria, white blood cell clumps noted.  This appears consistent with her baseline.  Urine culture reviewed from 2 days ago and had less than 10,000 colonies of growth.  2:15 PM Pt requesting repeat dose of dilaudid. On my prior observation of the pt, she was observed sleeping in no distress. I have ordered bentyl and toradol.   4:00 PM reassessed pt. She is laying comfortably in bed. She has been able to tolerate po at this time. Her VS have improved. Discussed findings and plan for d/c. Pt is requesting admission. Discussed risks and benefits of admission and informed pt that she currently does not meet criteria for admission.  4:25 PM I was informed by nursing staff that patient vomited again. She has received multiple rounds of antiemetics and continues to intractable NV. Will plan for admission for further tx.  4:41 PM  CONSULT With Dr. Pietro Cassis who accepts patient for admission.    Final Clinical Impression(s) / ED Diagnoses Final diagnoses:  Intractable nausea and vomiting    Rx / DC Orders ED Discharge Orders         Ordered    metoCLOPramide (REGLAN) 10 MG tablet  Every 6 hours     02/26/19 1614           Vedika Dumlao, [place], PA-C 02/26/19 1642    Valarie Merino, MD 03/03/19 1411

## 2019-02-26 NOTE — ED Notes (Addendum)
This RN calledfloor to give report nurse not availible at this time nor charge.Will call back at later time.  ED Charge and [facility] aware instruction given to wait and call report at later time.

## 2019-02-26 NOTE — ED Notes (Signed)
PA Courtni aware Pt unable to tolerate PO challenge

## 2019-02-27 DIAGNOSIS — D638 Anemia in other chronic diseases classified elsewhere: Secondary | ICD-10-CM | POA: Diagnosis present

## 2019-02-27 DIAGNOSIS — Z20822 Contact with and (suspected) exposure to covid-19: Secondary | ICD-10-CM | POA: Diagnosis present

## 2019-02-27 DIAGNOSIS — Z885 Allergy status to narcotic agent status: Secondary | ICD-10-CM | POA: Diagnosis not present

## 2019-02-27 DIAGNOSIS — Z8349 Family history of other endocrine, nutritional and metabolic diseases: Secondary | ICD-10-CM | POA: Diagnosis not present

## 2019-02-27 DIAGNOSIS — I110 Hypertensive heart disease with heart failure: Secondary | ICD-10-CM | POA: Diagnosis present

## 2019-02-27 DIAGNOSIS — D509 Iron deficiency anemia, unspecified: Secondary | ICD-10-CM | POA: Diagnosis present

## 2019-02-27 DIAGNOSIS — Z91010 Allergy to peanuts: Secondary | ICD-10-CM | POA: Diagnosis not present

## 2019-02-27 DIAGNOSIS — E1043 Type 1 diabetes mellitus with diabetic autonomic (poly)neuropathy: Secondary | ICD-10-CM | POA: Diagnosis present

## 2019-02-27 DIAGNOSIS — J45909 Unspecified asthma, uncomplicated: Secondary | ICD-10-CM | POA: Diagnosis present

## 2019-02-27 DIAGNOSIS — K2100 Gastro-esophageal reflux disease with esophagitis, without bleeding: Secondary | ICD-10-CM | POA: Diagnosis present

## 2019-02-27 DIAGNOSIS — N319 Neuromuscular dysfunction of bladder, unspecified: Secondary | ICD-10-CM | POA: Diagnosis present

## 2019-02-27 DIAGNOSIS — K3184 Gastroparesis: Secondary | ICD-10-CM | POA: Diagnosis present

## 2019-02-27 DIAGNOSIS — M1990 Unspecified osteoarthritis, unspecified site: Secondary | ICD-10-CM | POA: Diagnosis present

## 2019-02-27 DIAGNOSIS — Z809 Family history of malignant neoplasm, unspecified: Secondary | ICD-10-CM | POA: Diagnosis not present

## 2019-02-27 DIAGNOSIS — Z9641 Presence of insulin pump (external) (internal): Secondary | ICD-10-CM | POA: Diagnosis present

## 2019-02-27 DIAGNOSIS — R160 Hepatomegaly, not elsewhere classified: Secondary | ICD-10-CM | POA: Diagnosis present

## 2019-02-27 DIAGNOSIS — R000 Tachycardia, unspecified: Secondary | ICD-10-CM | POA: Diagnosis present

## 2019-02-27 DIAGNOSIS — E1065 Type 1 diabetes mellitus with hyperglycemia: Secondary | ICD-10-CM | POA: Diagnosis not present

## 2019-02-27 DIAGNOSIS — E1069 Type 1 diabetes mellitus with other specified complication: Secondary | ICD-10-CM | POA: Diagnosis present

## 2019-02-27 DIAGNOSIS — Z8249 Family history of ischemic heart disease and other diseases of the circulatory system: Secondary | ICD-10-CM | POA: Diagnosis not present

## 2019-02-27 DIAGNOSIS — Z794 Long term (current) use of insulin: Secondary | ICD-10-CM | POA: Diagnosis not present

## 2019-02-27 DIAGNOSIS — K760 Fatty (change of) liver, not elsewhere classified: Secondary | ICD-10-CM | POA: Diagnosis present

## 2019-02-27 DIAGNOSIS — I5032 Chronic diastolic (congestive) heart failure: Secondary | ICD-10-CM | POA: Diagnosis present

## 2019-02-27 DIAGNOSIS — R112 Nausea with vomiting, unspecified: Secondary | ICD-10-CM | POA: Diagnosis present

## 2019-02-27 DIAGNOSIS — R1115 Cyclical vomiting syndrome unrelated to migraine: Secondary | ICD-10-CM | POA: Diagnosis not present

## 2019-02-27 DIAGNOSIS — Z833 Family history of diabetes mellitus: Secondary | ICD-10-CM | POA: Diagnosis not present

## 2019-02-27 LAB — BASIC METABOLIC PANEL
Anion gap: 9 (ref 5–15)
BUN: 12 mg/dL (ref 6–20)
CO2: 25 mmol/L (ref 22–32)
Calcium: 9 mg/dL (ref 8.9–10.3)
Chloride: 106 mmol/L (ref 98–111)
Creatinine, Ser: 0.85 mg/dL (ref 0.44–1.00)
GFR calc Af Amer: >60 mL/min (ref >60)
GFR calc non Af Amer: >60 mL/min (ref >60)
Glucose, Bld: 130 mg/dL — ABNORMAL HIGH (ref 70–99)
Potassium: 3.4 mmol/L — ABNORMAL LOW (ref 3.5–5.1)
Sodium: 140 mmol/L (ref 135–145)

## 2019-02-27 LAB — URINE CULTURE: Culture: NO GROWTH

## 2019-02-27 LAB — CBC
HCT: 30.3 % — ABNORMAL LOW (ref 36.0–46.0)
Hemoglobin: 9.2 g/dL — ABNORMAL LOW (ref 12.0–15.0)
MCH: 25 pg — ABNORMAL LOW (ref 26.0–34.0)
MCHC: 30.4 g/dL (ref 30.0–36.0)
MCV: 82.3 fL (ref 80.0–100.0)
Platelets: 415 10*3/uL — ABNORMAL HIGH (ref 150–400)
RBC: 3.68 MIL/uL — ABNORMAL LOW (ref 3.87–5.11)
RDW: 16.4 % — ABNORMAL HIGH (ref 11.5–15.5)
WBC: 8.1 10*3/uL (ref 4.0–10.5)
nRBC: 0 % (ref 0.0–0.2)

## 2019-02-27 LAB — GLUCOSE, CAPILLARY
Glucose-Capillary: 179 mg/dL — ABNORMAL HIGH (ref 70–99)
Glucose-Capillary: 31 mg/dL — CL (ref 70–99)
Glucose-Capillary: 48 mg/dL — ABNORMAL LOW (ref 70–99)
Glucose-Capillary: 151 mg/dL — ABNORMAL HIGH (ref 70–99)
Glucose-Capillary: 208 mg/dL — ABNORMAL HIGH (ref 70–99)
Glucose-Capillary: 240 mg/dL — ABNORMAL HIGH (ref 70–99)

## 2019-02-27 LAB — SARS CORONAVIRUS 2 (TAT 6-24 HRS): SARS Coronavirus 2: NEGATIVE

## 2019-02-27 LAB — MAGNESIUM: Magnesium: 1.5 mg/dL — ABNORMAL LOW (ref 1.7–2.4)

## 2019-02-27 LAB — PHOSPHORUS: Phosphorus: 3.5 mg/dL (ref 2.5–4.6)

## 2019-02-27 MED ORDER — DEXTROSE 50 % IV SOLN
INTRAVENOUS | Status: AC
  Administered 2019-02-27: 08:00:00 50 mL via INTRAVENOUS
  Filled 2019-02-27: qty 50

## 2019-02-27 MED ORDER — MAGNESIUM SULFATE 2 GM/50ML IV SOLN
2.0000 g | Freq: Once | INTRAVENOUS | Status: AC
  Administered 2019-02-27: 2 g via INTRAVENOUS
  Filled 2019-02-27: qty 50

## 2019-02-27 MED ORDER — POTASSIUM CHLORIDE 10 MEQ/100ML IV SOLN
10.0000 meq | INTRAVENOUS | Status: AC
  Administered 2019-02-27 (×2): 10 meq via INTRAVENOUS
  Filled 2019-02-27 (×2): qty 100

## 2019-02-27 MED ORDER — HYDROMORPHONE HCL 1 MG/ML IJ SOLN
0.5000 mg | INTRAMUSCULAR | Status: DC | PRN
  Administered 2019-02-27 – 2019-02-28 (×6): 0.5 mg via INTRAVENOUS
  Filled 2019-02-27 (×7): qty 0.5

## 2019-02-27 MED ORDER — CHLORHEXIDINE GLUCONATE CLOTH 2 % EX PADS
6.0000 | MEDICATED_PAD | Freq: Every day | CUTANEOUS | Status: DC
  Administered 2019-02-27 – 2019-03-05 (×7): 6 via TOPICAL

## 2019-02-27 MED ORDER — PROMETHAZINE HCL 25 MG/ML IJ SOLN
12.5000 mg | INTRAMUSCULAR | Status: DC | PRN
  Administered 2019-02-27 – 2019-03-04 (×18): 12.5 mg via INTRAVENOUS
  Filled 2019-02-27 (×20): qty 1

## 2019-02-27 MED ORDER — PROMETHAZINE HCL 25 MG/ML IJ SOLN
25.0000 mg | Freq: Once | INTRAMUSCULAR | Status: AC
  Administered 2019-02-27: 25 mg via INTRAVENOUS
  Filled 2019-02-27: qty 1

## 2019-02-27 MED ORDER — ORAL CARE MOUTH RINSE
15.0000 mL | Freq: Two times a day (BID) | OROMUCOSAL | Status: DC
  Administered 2019-02-27 – 2019-03-05 (×13): 15 mL via OROMUCOSAL

## 2019-02-27 NOTE — Progress Notes (Signed)
Patient has green MEWS score at this time.  Vital Signs MEWS/VS Documentation      02/26/2019 2212 02/26/2019 2342 02/27/2019 0242 02/27/2019 0650   MEWS Score:  1  2  2  1    MEWS Score Color:  Green  Yellow  Yellow  Green   Resp:  --  18  16  18    Pulse:  --  (!) 122  (!) 117  (!) 108   BP:  --  (!) 134/94  (!) 138/96  116/82   Temp:  --  98.8 F (37.1 C)  98.8 F (37.1 C)  99.2 F (37.3 C)   O2 Device:  --  Room [employer]   Level of Consciousness:  Alert  --  --  --           Roderick Pee 02/27/2019,7:46 AM

## 2019-02-27 NOTE — Progress Notes (Addendum)
Patient's CBG-33. Alert and awake. She was given 2 glasses of orange juice(240 ml). Will recheck CBG in 15 minutes.Patient states her tongue goes numb when her glucose drops. Two more glasses of orange juice given.Tongue still numb.CBG-48. Dextrose 50%, 25 ml given. CBG-179. Patient vomited 300 ml orange, complaining of pain in her stomach and still feeling nauseated.

## 2019-02-27 NOTE — Progress Notes (Signed)
PROGRESS NOTE    [name]    Code Status: Full Code  MM:[id number] DOB: [birth year] DOA: 02/26/2019  PCP: Vicenta Aly, [facility] Summary  This is a 29 year old female with history of type 1 diabetes on insulin pump, recurrent DKA, multiple hospitalizations for complications of gastroparesis, intractable cyclical vomiting, hypertension, GERD, asthma with recent hospitalization from 1/30-2/4 for similar complaints and has received care from both [facility] health, [facility] and [facility] and was admitted on 2/7 for intractable nausea and vomiting.  A & P   Active Problems:   Intractable cyclical vomiting with nausea   1. Intractable cyclical nausea and vomiting with history of diabetic gastroparesis a. Gastric emptying study6/21/2018 with significantly delayed gastric emptying i. 2% emptied at 1 hr ( normal >= 10%) ii. 16% emptied at 2 hr ( normal >= 40%) iii. 20% emptied at 3 hr ( normal >= 70%) iv. 24% emptied at 4 hr ( normal >= 90%) b. UDS negative for THC or other substances c. I wonder if her recurrent symptoms are from chronic medications and/or withdrawal from opiates that she gets at frequent hospitalizations in setting of gastroparesis d. Discontinue Zofran and Reglan (states this does not help) e. Continue Phenergan and Dilaudid for now, consider very slow taper off these medications prior to discharge to see if this helps.  She does seem to have some pain seeking behavior (sleeping comfortably on my arrival -started wriggling in pain when I woke her up) f. Consider abdominal series to rule out obstruction if persistent symptoms 2. 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 Possibly exacerbated from anticholinergic effect of Phenergan b. Recommend taper as above c. Bladder scan and straight cath protocol 3. Hypoglycemia due to decreased p.o. intake and insulin a. Resolved with b. Hold long-acting c. Continue sliding scale  d. Diabetic coordinator consulted 4. Poorly controlled type 1 diabetes a. HA1C 7.8 b. Has insulin pump c. Diabetic coordinator consult 5. Abnormal UA likely interstitial cystitis a. Consider amitriptyline if symptomatic  DVT prophylaxis: Lovenox Diet: Clear liquid diet Family Communication: Patient updated at bedside Disposition Plan: Continue inpatient management of intractable nausea and vomiting  Consultants  None  Procedures  None  Antibiotics   Anti-infectives (From admission, onward)   None           Subjective   Patient with multiple episodes of hypoglycemia resolved with juice and D50.  Upon my arrival patient was sleeping comfortably.  Once I woke her she began to move persistently in pain and then sat up and was complaining of abdominal pain.  Admitted that she takes Reglan, Phenergan and Zofran on nearly a daily basis for the past several years.  Has typically resolution of symptoms when hospitalized but has difficulty managing symptoms once she is discharged.  Denies any chest pain, fever, chills, palpitations or any other complaints.  Objective   Vitals:   02/26/19 2342 02/27/19 0242 02/27/19 0650 02/27/19 1452  BP: (!) 134/94 (!) 138/96 116/82 (!) 156/109  Pulse: (!) 122 (!) 117 (!) 108 (!) 110  Resp: 18 16 18 16   Temp: 98.8 F (37.1 C) 98.8 F (37.1 C) 99.2 F (37.3 C) 98.3 F (36.8 C)  TempSrc: Oral Oral Oral Oral  SpO2: 100% 100% 99% 100%  Weight:      Height:        Intake/Output Summary (Last 24 hours) at 02/27/2019 1550 Last data filed at 02/27/2019 1100 Gross per 24 hour  Intake 2378.66 ml  Output 350 ml  Net 2028.66 ml   Filed Weights   02/26/19 0913  Weight: 54.4 kg    Examination:  Physical Exam Vitals and nursing note reviewed.  Constitutional:      Appearance: She is ill-appearing.  HENT:     Head: Normocephalic.  Cardiovascular:     Rate and Rhythm: Normal rate and regular rhythm.  Pulmonary:     Effort: Pulmonary  effort is normal. No respiratory distress.  Abdominal:     Palpations: Abdomen is soft.     Tenderness: There is generalized abdominal tenderness.  Neurological:     General: No focal deficit present.     Mental Status: She is alert.  Psychiatric:        Mood and Affect: Mood is anxious.     Data Reviewed: I have personally reviewed following labs and imaging studies  CBC: Recent Labs  Lab 02/22/19 0508 02/23/19 0428 02/24/19 0954 02/26/19 0958 02/27/19 0409  WBC 6.4 6.4 11.7* 7.2 8.1  NEUTROABS  --   --  9.4* 4.0  --   HGB 7.9* 7.6* 9.3* 8.8* 9.2*  HCT 25.7* 25.1* 30.8* 28.4* 30.3*  MCV 83.4 83.1 83.2 82.3 82.3  PLT 376 365 444* 388 [id number]*   Basic Metabolic Panel: Recent Labs  Lab 02/22/19 0508 02/23/19 0428 02/24/19 0954 02/26/19 0958 02/27/19 0409  NA 138 138 137 135 140  K 3.8 3.4* 4.2 3.4* 3.4*  CL 103 103 101 100 106  CO2 26 30 22 23 25   GLUCOSE 324* 235* 499* 223* 130*  BUN 8 15 13 13 12   CREATININE 0.66 0.61 0.85 0.66 0.85  CALCIUM 8.3* 8.6* 9.4 8.7* 9.0  MG  --   --   --   --  1.5*  PHOS  --   --   --   --  3.5   GFR: Estimated Creatinine Clearance: 83.9 mL/min (by C-G formula based on SCr of 0.85 mg/dL). Liver Function Tests: Recent Labs  Lab 02/24/19 0954 02/26/19 0958  AST 13* 12*  ALT 10 9  ALKPHOS 77 67  BILITOT 1.1 1.1  PROT 7.2 6.5  ALBUMIN 3.9 3.6   Recent Labs  Lab 02/24/19 0954 02/26/19 0958  LIPASE 14 15   No results for input(s): AMMONIA in the last 168 hours. Coagulation Profile: No results for input(s): INR, PROTIME in the last 168 hours. Cardiac Enzymes: No results for input(s): CKTOTAL, CKMB, CKMBINDEX, TROPONINI in the last 168 hours. BNP (last 3 results) No results for input(s): PROBNP in the last 8760 hours. HbA1C: No results for input(s): HGBA1C in the last 72 hours. CBG: Recent Labs  Lab 02/26/19 2222 02/27/19 0754 02/27/19 0811 02/27/19 0826 02/27/19 1212  GLUCAP 174* 31* 48* 179* 151*   Lipid Profile:  No results for input(s): CHOL, HDL, LDLCALC, TRIG, CHOLHDL, LDLDIRECT in the last 72 hours. Thyroid Function Tests: No results for input(s): TSH, T4TOTAL, FREET4, T3FREE, THYROIDAB in the last 72 hours. Anemia Panel: No results for input(s): VITAMINB12, FOLATE, FERRITIN, TIBC, IRON, RETICCTPCT in the last 72 hours. Sepsis Labs: No results for input(s): PROCALCITON, LATICACIDVEN in the last 168 hours.  Recent Results (from the past 240 hour(s))  Urine culture     Status: Abnormal   Collection Time: 02/18/19  2:23 PM   Specimen: Urine, Clean Catch  Result Value Ref Range Status   Specimen Description   Final    URINE, CLEAN CATCH Performed at [facility], [address] [place], [place]  [id number]    Special Requests   Final    NONE Performed at [facility], [place] [address] [place], [place] [address]    Culture (A)  Final    <10,000 COLONIES/mL INSIGNIFICANT GROWTH Performed at [place] [address] [place], [place] [address]    Report Status 02/20/2019 FINAL  Final  SARS CORONAVIRUS 2 (TAT 6-24 HRS) Nasopharyngeal Nasopharyngeal Swab     Status: None   Collection Time: 02/18/19  3:00 PM   Specimen: Nasopharyngeal Swab  Result Value Ref Range Status   SARS Coronavirus 2 NEGATIVE NEGATIVE Final    Comment: (NOTE) SARS-CoV-2 target nucleic acids are NOT DETECTED. The SARS-CoV-2 RNA is generally detectable in upper and lower respiratory specimens during the acute phase of infection. Negative results do not preclude SARS-CoV-2 infection, do not rule out co-infections with other pathogens, and should not be used as the sole basis for treatment or other patient management decisions. Negative results must be combined with clinical observations, patient history, and epidemiological information. The expected result is Negative. Fact Sheet for Patients: SugarRoll.be Fact Sheet for Healthcare Providers:  https://www.woods-mathews.com/ This test is not yet approved or cleared by the [place] FDA and  has been authorized for detection and/or diagnosis of SARS-CoV-2 by FDA under an Emergency Use Authorization (EUA). This EUA will remain  in effect (meaning this test can be used) for the duration of the COVID-19 declaration under Section 56 4(b)(1) of the Act, 21 U.S.C. section 360bbb-3(b)(1), unless the authorization is terminated or revoked sooner. Performed at [facility], [place] [address], [place], [place] [address]   Urine culture     Status: Abnormal   Collection Time: 02/24/19 10:04 AM   Specimen: Urine, Random  Result Value Ref Range Status   Specimen Description   Final    URINE, RANDOM Performed at [place] [address] [place], [place] [address]    Special Requests   Final    NONE Performed at [facility], [place] [address] [place], [place] [address]    Culture (A)  Final    <10,000 COLONIES/mL INSIGNIFICANT GROWTH Performed at [place] [address] [place], [place] [address]    Report Status 02/26/2019 FINAL  Final  Urine culture     Status: None   Collection Time: 02/26/19  4:26 PM   Specimen: Urine, Random  Result Value Ref Range Status   Specimen Description   Final    URINE, RANDOM Performed at [place] [address] [place], [place] [address]    Special Requests   Final    NONE Performed at [facility], [place] [address] [place],  [address]    Culture   Final    NO GROWTH Performed at [facility], [place] [address] [place],  [address]    Report Status 02/27/2019 FINAL  Final  SARS CORONAVIRUS 2 (TAT 6-24 HRS) Nasopharyngeal Nasopharyngeal Swab     Status: None   Collection Time: 02/26/19  4:32 PM   Specimen: Nasopharyngeal Swab  Result Value Ref Range Status   SARS Coronavirus 2 NEGATIVE NEGATIVE Final     Comment: (NOTE) SARS-CoV-2 target nucleic acids are NOT DETECTED. The SARS-CoV-2 RNA is generally detectable in upper and lower respiratory specimens during the acute phase of infection. Negative results do not preclude SARS-CoV-2 infection, do not rule out co-infections with other pathogens, and should not be used  as the sole basis for treatment or other patient management decisions. Negative results must be combined with clinical observations, patient history, and epidemiological information. The expected result is Negative. Fact Sheet for Patients: SugarRoll.be Fact Sheet for Healthcare Providers: https://www.woods-mathews.com/ This test is not yet approved or cleared by the [place] FDA and  has been authorized for detection and/or diagnosis of SARS-CoV-2 by FDA under an Emergency Use Authorization (EUA). This EUA will remain  in effect (meaning this test can be used) for the duration of the COVID-19 declaration under Section 56 4(b)(1) of the Act, 21 U.S.C. section 360bbb-3(b)(1), unless the authorization is terminated or revoked sooner. Performed at [facility], [place] [address], [place], [place] [address]          Radiology Studies: No results found.      Scheduled Meds: . Chlorhexidine Gluconate Cloth  6 each Topical Daily  . enoxaparin (LOVENOX) injection  40 mg Subcutaneous Q24H  . insulin aspart  0-5 Units Subcutaneous QHS  . insulin aspart  0-9 Units Subcutaneous TID WC  . mouth rinse  15 mL Mouth Rinse BID  . senna  1 tablet Oral BID  . sodium chloride flush  3 mL Intravenous Q12H   Continuous Infusions: . sodium chloride 1,000 mL (02/27/19 1440)     LOS: 0 days    Time spent: 30 minutes with over 50% of the time coordinating the patient's care    Harold Hedge, DO [facility] Pager [phone]  If 7PM-7AM, please contact night-coverage [email or web address] Password [email or web address] 02/27/2019, 3:50 PM

## 2019-02-27 NOTE — Progress Notes (Signed)
Initial Nutrition Assessment  INTERVENTION:   Once diet advanced: -Glucerna Shake po TID, each supplement provides 220 kcal and 10 grams of protein -Multivitamin with minerals daily  NUTRITION DIAGNOSIS:   Inadequate oral intake related to nausea, vomiting(gastroparesis) as evidenced by per patient/family report.  GOAL:   Patient will meet greater than or equal to 90% of their needs  MONITOR:   PO intake, Supplement acceptance, Labs, Weight trends, I & O's, Diet advancement  REASON FOR ASSESSMENT:   Malnutrition Screening Tool    ASSESSMENT:   29 year old female with PMH significant for T1DM on insulin pump, recurrent DKA, gastroparesis, intractable cyclic vomiting, multiple hospitalizations for the same, HTN, GERD, asthma.She was recently hospitalized 1/30-2/4 for similar complaint of intractable vomiting leading to DKA.Patient returns back to the ED again with multiple episodes of vomiting and inability to tolerate oral intake.  **RD working remotely**  Patient re-admitted following discharge on 2/4. Per chart review, pt was consuming 100% of meals by the time of discharge.  Pt continues to have difficulty controlling blood sugars and N/V. No PO documented for this admission yet. Pt on clear liquids. Once diet is advanced, recommend resuming protein supplements (Glucerna shakes).    Per weight records, pt with the same weight recorded for the last 4 dates.   I/Os: +2L since admit Emesis: 350 ml x 24 hrs  Medications: IV Mg sulfate, IV KCL, IV Phenergan Labs reviewed: CBGs: 151-179   NUTRITION - FOCUSED PHYSICAL EXAM:  Working remotely.  Diet Order:   Diet Order            Diet clear liquid Room service appropriate? Yes; Fluid consistency: Thin  Diet effective now              EDUCATION NEEDS:   No education needs have been identified at this time  Skin:  Skin Assessment: Reviewed RN Assessment  Last BM:  2/6  Height:   Ht Readings from Last 1  Encounters:  02/26/19 5\' 4"  (1.626 m)    Weight:   Wt Readings from Last 1 Encounters:  02/26/19 54.4 kg    Ideal Body Weight:  54.5 kg  BMI:  Body mass index is 20.6 kg/m.  Estimated Nutritional Needs:   Kcal:  1500-1700  Protein:  70-80g  Fluid:  1.6L/day  Mendel Ryder, MS, RD, LDN Inpatient Clinical Dietitian Contact information available via Amion

## 2019-02-27 NOTE — Progress Notes (Signed)
Pt ambulated x3 in the hall, tolerated well

## 2019-02-27 NOTE — Progress Notes (Signed)
Patient ambulating in the hall, tolerating well.

## 2019-02-27 NOTE — Progress Notes (Signed)
Patient has insulin pump. It is still attached, but patient reports it has been turned off. Patient educated to not use her pump while she is here.  Patient was told that we will check her sugars and administer insulin per MD orders. Patient understands.[name]

## 2019-02-27 NOTE — Progress Notes (Signed)
Patient continues to have N/V and pain unrelieved by ordered medications. On call provider has been notified multiple times tonight, but patient still has no relief.  Patient is wanting opiate pain medication and stronger N/V Meds.  On call provider was made aware.  MD per note trying to avoid opiates. Will continue to monitor vitals, low CBG, and pain/nausea. Roderick Pee

## 2019-02-28 LAB — COMPREHENSIVE METABOLIC PANEL
ALT: 10 U/L (ref 0–44)
AST: 12 U/L — ABNORMAL LOW (ref 15–41)
Albumin: 3.4 g/dL — ABNORMAL LOW (ref 3.5–5.0)
Alkaline Phosphatase: 77 U/L (ref 38–126)
Anion gap: 8 (ref 5–15)
BUN: 10 mg/dL (ref 6–20)
CO2: 22 mmol/L (ref 22–32)
Calcium: 8.7 mg/dL — ABNORMAL LOW (ref 8.9–10.3)
Chloride: 106 mmol/L (ref 98–111)
Creatinine, Ser: 0.8 mg/dL (ref 0.44–1.00)
GFR calc Af Amer: >60 mL/min (ref >60)
GFR calc non Af Amer: >60 mL/min (ref >60)
Glucose, Bld: 237 mg/dL — ABNORMAL HIGH (ref 70–99)
Potassium: 3.8 mmol/L (ref 3.5–5.1)
Sodium: 136 mmol/L (ref 135–145)
Total Bilirubin: 1 mg/dL (ref 0.3–1.2)
Total Protein: 6.7 g/dL (ref 6.5–8.1)

## 2019-02-28 LAB — CBC
HCT: 30.2 % — ABNORMAL LOW (ref 36.0–46.0)
Hemoglobin: 8.9 g/dL — ABNORMAL LOW (ref 12.0–15.0)
MCH: 25.1 pg — ABNORMAL LOW (ref 26.0–34.0)
MCHC: 29.5 g/dL — ABNORMAL LOW (ref 30.0–36.0)
MCV: 85.1 fL (ref 80.0–100.0)
Platelets: 372 10*3/uL (ref 150–400)
RBC: 3.55 MIL/uL — ABNORMAL LOW (ref 3.87–5.11)
RDW: 17.1 % — ABNORMAL HIGH (ref 11.5–15.5)
WBC: 8.4 10*3/uL (ref 4.0–10.5)
nRBC: 0 % (ref 0.0–0.2)

## 2019-02-28 LAB — GLUCOSE, CAPILLARY
Glucose-Capillary: 136 mg/dL — ABNORMAL HIGH (ref 70–99)
Glucose-Capillary: 70 mg/dL (ref 70–99)
Glucose-Capillary: 285 mg/dL — ABNORMAL HIGH (ref 70–99)
Glucose-Capillary: 147 mg/dL — ABNORMAL HIGH (ref 70–99)

## 2019-02-28 LAB — MAGNESIUM: Magnesium: 1.8 mg/dL (ref 1.7–2.4)

## 2019-02-28 MED ORDER — INSULIN GLARGINE 100 UNIT/ML ~~LOC~~ SOLN
12.0000 [IU] | Freq: Every day | SUBCUTANEOUS | Status: DC
  Administered 2019-02-28 – 2019-03-01 (×2): 12 [IU] via SUBCUTANEOUS
  Filled 2019-02-28 (×2): qty 0.12

## 2019-02-28 MED ORDER — HYDROMORPHONE HCL 1 MG/ML IJ SOLN
0.5000 mg | INTRAMUSCULAR | Status: DC | PRN
  Administered 2019-02-28 – 2019-03-01 (×6): 0.5 mg via INTRAVENOUS
  Filled 2019-02-28: qty 0.5
  Filled 2019-02-28: qty 1
  Filled 2019-02-28 (×6): qty 0.5

## 2019-02-28 NOTE — Progress Notes (Signed)
Inpatient Diabetes Program Recommendations  AACE/ADA: New Consensus Statement on Inpatient Glycemic Control (2015)  Target Ranges:  Prepandial:   less than 140 mg/dL      Peak postprandial:   less than 180 mg/dL (1-2 hours)      Critically ill patients:  140 - 180 mg/dL   Lab Results  Component Value Date   GLUCAP 70 02/28/2019   HGBA1C 7.8 (H) 02/18/2019    Review of Glycemic Control  Diabetes history: DM1 Outpatient Diabetes medications: Insulin pump Current orders for Inpatient glycemic control: Novolog 0-9 units tidwc and 0-5 units QHS  HgbA1C - 7.8%  Inpatient Diabetes Program Recommendations:     Lantus 12 units Q24H Change Novolog to 0-9 units Q4H If pt eats, add Novolog 4 units tidwc for meal coverage insulin  Will speak with pt this afternoon about status of insulin pump in OP setting.   Secure text to MD and RN.  Thank you. Lorenda Peck, RD, LDN, CDE Inpatient Diabetes Coordinator [phone]

## 2019-02-28 NOTE — Progress Notes (Signed)
Received report from [facility]. Introduced myself to patient, reassessed, no new changes with patient. Will continue to monitor. Coolidge Gossard Therapist, sports.

## 2019-02-28 NOTE — Progress Notes (Signed)
PROGRESS NOTE    [name]    Code Status: Full Code  UA:[id number] DOB: 1990/04/13 DOA: 02/26/2019  PCP: Vicenta Aly, [facility] Summary  This is a 29 year old female with history of type 1 diabetes on insulin pump, recurrent DKA, multiple hospitalizations for complications of gastroparesis, intractable cyclical vomiting, hypertension, GERD, asthma with recent hospitalization from 1/30-2/4 for similar complaints and has received care from both [facility], [facility] and was admitted on 2/7 for intractable nausea and vomiting.  A & P   Active Problems:   Gastroparesis   Intractable cyclical vomiting with nausea   1. Intractable cyclical nausea and vomiting with history of diabetic gastroparesis a. Gastric emptying study6/21/2018 with significantly delayed gastric emptying i. 2% emptied at 1 hr ( normal >= 10%) ii. 16% emptied at 2 hr ( normal >= 40%) iii. 20% emptied at 3 hr ( normal >= 70%) iv. 24% emptied at 4 hr ( normal >= 90%) b. UDS negative for THC or other substances c. I wonder if her recurrent symptoms are from gastroparesis exacerbated by chronic medication use and/or withdrawal from meds once she goes home (not getting IV) leading to frequent hospitalizations d. Continue Phenergan and Dilaudid for now, consider very slow taper off these medications prior to discharge to see if this helps.  - this was discussed with her and she agrees to try and slowly taper off e. Still not tolerating PO intake, Make NPO for now 2. 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 Possibly exacerbated from anticholinergic effect of Phenergan b. Recommend taper as above c. Bladder scan and straight cath protocol 3. Hypoglycemia due to decreased p.o. intake and insulin Resolved  4. Poorly controlled type 1 diabetes a. HA1C 7.8 b. Add on Lantus, Continue sliding scale c. Diabetic coordinator consult 5. Abnormal UA likely  interstitial cystitis a. Consider amitriptyline if symptomatic  DVT prophylaxis: Lovenox Diet: npo Family Communication: Patient updated at bedside Disposition Plan: Continue inpatient management for pain control, intractable N/V and advancement of diet.   Consultants  None  Procedures  None  Antibiotics   Anti-infectives (From admission, onward)   None           Subjective   No events overnight. Still not tolerating PO intake. I explained the plan as above and she agreed to try and wean off medications slowly  Objective   Vitals:   02/27/19 1452 02/27/19 2115 02/28/19 0632 02/28/19 1337  BP: (!) 156/109 124/84 112/87 112/67  Pulse: (!) 110 (!) 107 (!) 108 97  Resp: 16 16 14 16   Temp: 98.3 F (36.8 C) 100.1 F (37.8 C) 99.5 F (37.5 C) 98.8 F (37.1 C)  TempSrc: Oral Oral Oral Oral  SpO2: 100% 100% 98% 98%  Weight:      Height:       No intake or output data in the 24 hours ending 02/28/19 1443 Filed Weights   02/26/19 0913  Weight: 54.4 kg    Examination:  Physical Exam Vitals and nursing note reviewed.  Constitutional:      General: She is not in acute distress.    Appearance: She is not ill-appearing or toxic-appearing.  Cardiovascular:     Rate and Rhythm: Normal rate and regular rhythm.  Pulmonary:     Effort: Pulmonary effort is normal.     Breath sounds: Normal breath sounds.  Abdominal:     General: Abdomen is flat.     Tenderness: There  is generalized abdominal tenderness.  Neurological:     General: No focal deficit present.     Mental Status: She is alert.  Psychiatric:        Mood and Affect: Mood normal.        Behavior: Behavior normal.     Data Reviewed: I have personally reviewed following labs and imaging studies  CBC: Recent Labs  Lab 02/23/19 0428 02/24/19 0954 02/26/19 0958 02/27/19 0409 02/28/19 0545  WBC 6.4 11.7* 7.2 8.1 8.4  NEUTROABS  --  9.4* 4.0  --   --   HGB 7.6* 9.3* 8.8* 9.2* 8.9*  HCT 25.1* 30.8*  28.4* 30.3* 30.2*  MCV 83.1 83.2 82.3 82.3 85.1  PLT 365 444* 388 415* [id number]   Basic Metabolic Panel: Recent Labs  Lab 02/23/19 0428 02/24/19 0954 02/26/19 0958 02/27/19 0409 02/28/19 0545  NA 138 137 135 140 136  K 3.4* 4.2 3.4* 3.4* 3.8  CL 103 101 100 106 106  CO2 30 22 23 25 22   GLUCOSE 235* 499* 223* 130* 237*  BUN 15 13 13 12 10   CREATININE 0.61 0.85 0.66 0.85 0.80  CALCIUM 8.6* 9.4 8.7* 9.0 8.7*  MG  --   --   --  1.5* 1.8  PHOS  --   --   --  3.5  --    GFR: Estimated Creatinine Clearance: 89.1 mL/min (by C-G formula based on SCr of 0.8 mg/dL). Liver Function Tests: Recent Labs  Lab 02/24/19 0954 02/26/19 0958 02/28/19 0545  AST 13* 12* 12*  ALT 10 9 10   ALKPHOS 77 67 77  BILITOT 1.1 1.1 1.0  PROT 7.2 6.5 6.7  ALBUMIN 3.9 3.6 3.4*   Recent Labs  Lab 02/24/19 0954 02/26/19 0958  LIPASE 14 15   No results for input(s): AMMONIA in the last 168 hours. Coagulation Profile: No results for input(s): INR, PROTIME in the last 168 hours. Cardiac Enzymes: No results for input(s): CKTOTAL, CKMB, CKMBINDEX, TROPONINI in the last 168 hours. BNP (last 3 results) No results for input(s): PROBNP in the last 8760 hours. HbA1C: No results for input(s): HGBA1C in the last 72 hours. CBG: Recent Labs  Lab 02/27/19 1212 02/27/19 1648 02/27/19 2113 02/28/19 0730 02/28/19 1202  GLUCAP 151* 208* 240* 136* 70   Lipid Profile: No results for input(s): CHOL, HDL, LDLCALC, TRIG, CHOLHDL, LDLDIRECT in the last 72 hours. Thyroid Function Tests: No results for input(s): TSH, T4TOTAL, FREET4, T3FREE, THYROIDAB in the last 72 hours. Anemia Panel: No results for input(s): VITAMINB12, FOLATE, FERRITIN, TIBC, IRON, RETICCTPCT in the last 72 hours. Sepsis Labs: No results for input(s): PROCALCITON, LATICACIDVEN in the last 168 hours.  Recent Results (from the past 240 hour(s))  SARS CORONAVIRUS 2 (TAT 6-24 HRS) Nasopharyngeal Nasopharyngeal Swab     Status: None   Collection  Time: 02/18/19  3:00 PM   Specimen: Nasopharyngeal Swab  Result Value Ref Range Status   SARS Coronavirus 2 NEGATIVE NEGATIVE Final    Comment: (NOTE) SARS-CoV-2 target nucleic acids are NOT DETECTED. The SARS-CoV-2 RNA is generally detectable in upper and lower respiratory specimens during the acute phase of infection. Negative results do not preclude SARS-CoV-2 infection, do not rule out co-infections with other pathogens, and should not be used as the sole basis for treatment or other patient management decisions. Negative results must be combined with clinical observations, patient history, and epidemiological information. The expected result is Negative. Fact Sheet for Patients: SugarRoll.be Fact Sheet for Healthcare Providers: https://www.woods-mathews.com/  This test is not yet approved or cleared by the [place] and  has been authorized for detection and/or diagnosis of SARS-CoV-2 by FDA under an Emergency Use Authorization (EUA). This EUA will remain  in effect (meaning this test can be used) for the duration of the COVID-19 declaration under Section 56 4(b)(1) of the Act, 21 U.S.C. section 360bbb-3(b)(1), unless the authorization is terminated or revoked sooner. Performed at [facility], [place] [address], [place], [place] [address]   Urine culture     Status: Abnormal   Collection Time: 02/24/19 10:04 AM   Specimen: Urine, Random  Result Value Ref Range Status   Specimen Description   Final    URINE, RANDOM Performed at [facility] [address] [place], [place] [address]    Special Requests   Final    NONE Performed at [facility], [place] [address] [place], [place] [address]    Culture (A)  Final    <10,000 COLONIES/mL INSIGNIFICANT GROWTH Performed at [facility] [address] [place], [place] [address]    Report Status 02/26/2019 FINAL  Final  Urine  culture     Status: None   Collection Time: 02/26/19  4:26 PM   Specimen: Urine, Random  Result Value Ref Range Status   Specimen Description   Final    URINE, RANDOM Performed at [facility] [address], [place], [place] [address]    Special Requests   Final    NONE Performed at [facility], [place] [address] [place], [place] [address]    Culture   Final    NO GROWTH Performed at [facility], [place] [address] [place], [place] [address]    Report Status 02/27/2019 FINAL  Final  SARS CORONAVIRUS 2 (TAT 6-24 HRS) Nasopharyngeal Nasopharyngeal Swab     Status: None   Collection Time: 02/26/19  4:32 PM   Specimen: Nasopharyngeal Swab  Result Value Ref Range Status   SARS Coronavirus 2 NEGATIVE NEGATIVE Final    Comment: (NOTE) SARS-CoV-2 target nucleic acids are NOT DETECTED. The SARS-CoV-2 RNA is generally detectable in upper and lower respiratory specimens during the acute phase of infection. Negative results do not preclude SARS-CoV-2 infection, do not rule out co-infections with other pathogens, and should not be used as the sole basis for treatment or other patient management decisions. Negative results must be combined with clinical observations, patient history, and epidemiological information. The expected result is Negative. Fact Sheet for Patients: SugarRoll.be Fact Sheet for Healthcare Providers: https://www.woods-mathews.com/ This test is not yet approved or cleared by the [place] FDA and  has been authorized for detection and/or diagnosis of SARS-CoV-2 by FDA under an Emergency Use Authorization (EUA). This EUA will remain  in effect (meaning this test can be used) for the duration of the COVID-19 declaration under Section 56 4(b)(1) of the Act, 21 U.S.C. section 360bbb-3(b)(1), unless the authorization is terminated or revoked sooner. Performed at [facility],  [place] [address] [place], [place] [address]          Radiology Studies: No results found.      Scheduled Meds: . Chlorhexidine Gluconate Cloth  6 each Topical Daily  . enoxaparin (LOVENOX) injection  40 mg Subcutaneous Q24H  . insulin aspart  0-5 Units Subcutaneous QHS  . insulin aspart  0-9 Units Subcutaneous TID WC  . insulin glargine  12 Units Subcutaneous Daily  . mouth  rinse  15 mL Mouth Rinse BID  . senna  1 tablet Oral BID  . sodium chloride flush  3 mL Intravenous Q12H   Continuous Infusions: . sodium chloride 100 mL/hr at 02/28/19 1122     LOS: 1 day    Time spent: 28 minutes with over 50% of the time coordinating the patient's care    Harold Hedge, DO [facility] Pager [phone]  If 7PM-7AM, please contact night-coverage [email or web address] Password [facility] 02/28/2019, 2:43 PM

## 2019-03-01 DIAGNOSIS — K3184 Gastroparesis: Secondary | ICD-10-CM

## 2019-03-01 DIAGNOSIS — R1115 Cyclical vomiting syndrome unrelated to migraine: Secondary | ICD-10-CM

## 2019-03-01 LAB — COMPREHENSIVE METABOLIC PANEL
ALT: 11 U/L (ref 0–44)
AST: 10 U/L — ABNORMAL LOW (ref 15–41)
Albumin: 3.5 g/dL (ref 3.5–5.0)
Alkaline Phosphatase: 81 U/L (ref 38–126)
Anion gap: 17 — ABNORMAL HIGH (ref 5–15)
BUN: 12 mg/dL (ref 6–20)
CO2: 10 mmol/L — ABNORMAL LOW (ref 22–32)
Calcium: 8.7 mg/dL — ABNORMAL LOW (ref 8.9–10.3)
Chloride: 107 mmol/L (ref 98–111)
Creatinine, Ser: 0.93 mg/dL (ref 0.44–1.00)
GFR calc Af Amer: >60 mL/min (ref >60)
GFR calc non Af Amer: >60 mL/min (ref >60)
Glucose, Bld: 350 mg/dL — ABNORMAL HIGH (ref 70–99)
Potassium: 4.5 mmol/L (ref 3.5–5.1)
Sodium: 134 mmol/L — ABNORMAL LOW (ref 135–145)
Total Bilirubin: 1.3 mg/dL — ABNORMAL HIGH (ref 0.3–1.2)
Total Protein: 6.8 g/dL (ref 6.5–8.1)

## 2019-03-01 LAB — BASIC METABOLIC PANEL
Anion gap: 17 — ABNORMAL HIGH (ref 5–15)
Anion gap: 9 (ref 5–15)
BUN: 13 mg/dL (ref 6–20)
BUN: 12 mg/dL (ref 6–20)
CO2: 7 mmol/L — ABNORMAL LOW (ref 22–32)
CO2: 13 mmol/L — ABNORMAL LOW (ref 22–32)
Calcium: 8.6 mg/dL — ABNORMAL LOW (ref 8.9–10.3)
Calcium: 8.9 mg/dL (ref 8.9–10.3)
Chloride: 109 mmol/L (ref 98–111)
Chloride: 114 mmol/L — ABNORMAL HIGH (ref 98–111)
Creatinine, Ser: 0.93 mg/dL (ref 0.44–1.00)
Creatinine, Ser: 0.9 mg/dL (ref 0.44–1.00)
GFR calc Af Amer: >60 mL/min (ref >60)
GFR calc Af Amer: >60 mL/min (ref >60)
GFR calc non Af Amer: >60 mL/min (ref >60)
GFR calc non Af Amer: >60 mL/min (ref >60)
Glucose, Bld: 351 mg/dL — ABNORMAL HIGH (ref 70–99)
Glucose, Bld: 188 mg/dL — ABNORMAL HIGH (ref 70–99)
Potassium: 4.7 mmol/L (ref 3.5–5.1)
Potassium: 4.1 mmol/L (ref 3.5–5.1)
Sodium: 133 mmol/L — ABNORMAL LOW (ref 135–145)
Sodium: 136 mmol/L (ref 135–145)

## 2019-03-01 LAB — CBC
HCT: 31.1 % — ABNORMAL LOW (ref 36.0–46.0)
Hemoglobin: 9.1 g/dL — ABNORMAL LOW (ref 12.0–15.0)
MCH: 25.5 pg — ABNORMAL LOW (ref 26.0–34.0)
MCHC: 29.3 g/dL — ABNORMAL LOW (ref 30.0–36.0)
MCV: 87.1 fL (ref 80.0–100.0)
Platelets: 407 10*3/uL — ABNORMAL HIGH (ref 150–400)
RBC: 3.57 MIL/uL — ABNORMAL LOW (ref 3.87–5.11)
RDW: 17.1 % — ABNORMAL HIGH (ref 11.5–15.5)
WBC: 10.5 10*3/uL (ref 4.0–10.5)
nRBC: 0 % (ref 0.0–0.2)

## 2019-03-01 LAB — GLUCOSE, CAPILLARY
Glucose-Capillary: 324 mg/dL — ABNORMAL HIGH (ref 70–99)
Glucose-Capillary: 299 mg/dL — ABNORMAL HIGH (ref 70–99)
Glucose-Capillary: 331 mg/dL — ABNORMAL HIGH (ref 70–99)
Glucose-Capillary: 273 mg/dL — ABNORMAL HIGH (ref 70–99)
Glucose-Capillary: 165 mg/dL — ABNORMAL HIGH (ref 70–99)
Glucose-Capillary: 169 mg/dL — ABNORMAL HIGH (ref 70–99)
Glucose-Capillary: 141 mg/dL — ABNORMAL HIGH (ref 70–99)
Glucose-Capillary: 153 mg/dL — ABNORMAL HIGH (ref 70–99)
Glucose-Capillary: 85 mg/dL (ref 70–99)

## 2019-03-01 LAB — URINALYSIS, ROUTINE W REFLEX MICROSCOPIC
Bilirubin Urine: NEGATIVE
Glucose, UA: >=500 mg/dL — AB
Ketones, ur: 80 mg/dL — AB
Nitrite: NEGATIVE
Protein, ur: 100 mg/dL — AB
RBC / HPF: >50 RBC/hpf — ABNORMAL HIGH (ref 0–5)
Specific Gravity, Urine: 1.012 (ref 1.005–1.030)
WBC, UA: >50 WBC/hpf — ABNORMAL HIGH (ref 0–5)
pH: 6 (ref 5.0–8.0)

## 2019-03-01 LAB — BETA-HYDROXYBUTYRIC ACID: Beta-Hydroxybutyric Acid: 8.32 mmol/L — ABNORMAL HIGH (ref 0.05–0.27)

## 2019-03-01 LAB — BLOOD GAS, VENOUS
Acid-base deficit: 20.2 mmol/L — ABNORMAL HIGH (ref 0.0–2.0)
Acid-base deficit: 9.9 mmol/L — ABNORMAL HIGH (ref 0.0–2.0)
Bicarbonate: 15 mmol/L — ABNORMAL LOW (ref 20.0–28.0)
Bicarbonate: 7.6 mmol/L — ABNORMAL LOW (ref 20.0–28.0)
O2 Saturation: 85.6 %
O2 Saturation: 98.5 %
Patient temperature: 98.6
Patient temperature: 98.6
pCO2, Ven: 30.7 mmHg — ABNORMAL LOW (ref 44.0–60.0)
pCO2, Ven: 42.9 mmHg — ABNORMAL LOW (ref 44.0–60.0)
pH, Ven: 7.309 (ref 7.250–7.430)
pH, Ven: 7.408 (ref 7.250–7.430)
pO2, Ven: 42 mmHg (ref 32.0–45.0)
pO2, Ven: 53.7 mmHg — ABNORMAL HIGH (ref 32.0–45.0)

## 2019-03-01 LAB — MRSA PCR SCREENING: MRSA by PCR: NEGATIVE

## 2019-03-01 LAB — LACTIC ACID, PLASMA: Lactic Acid, Venous: 0.9 mmol/L (ref 0.5–1.9)

## 2019-03-01 MED ORDER — OXYCODONE HCL 5 MG PO TABS
5.0000 mg | ORAL_TABLET | ORAL | Status: DC | PRN
  Administered 2019-03-02 – 2019-03-05 (×10): 5 mg via ORAL
  Filled 2019-03-01 (×12): qty 1

## 2019-03-01 MED ORDER — SODIUM CHLORIDE 0.9% FLUSH
10.0000 mL | Freq: Two times a day (BID) | INTRAVENOUS | Status: DC
  Administered 2019-03-01 – 2019-03-04 (×6): 10 mL

## 2019-03-01 MED ORDER — LACTATED RINGERS IV BOLUS: 1000.0000 mL | Freq: Once | INTRAVENOUS | Status: DC

## 2019-03-01 MED ORDER — SODIUM CHLORIDE 0.9% FLUSH
10.0000 mL | INTRAVENOUS | Status: DC | PRN
  Administered 2019-03-06: 10 mL

## 2019-03-01 MED ORDER — INSULIN ASPART 100 UNIT/ML ~~LOC~~ SOLN
0.0000 [IU] | SUBCUTANEOUS | Status: DC
  Administered 2019-03-01: 21:00:00 2 [IU] via SUBCUTANEOUS

## 2019-03-01 MED ORDER — DEXTROSE 50 % IV SOLN: 0.0000 mL | INTRAVENOUS | Status: DC | PRN

## 2019-03-01 MED ORDER — LACTATED RINGERS IV BOLUS
500.0000 mL | Freq: Once | INTRAVENOUS | Status: AC
  Administered 2019-03-01: 500 mL via INTRAVENOUS

## 2019-03-01 MED ORDER — HYDROMORPHONE HCL 1 MG/ML IJ SOLN
0.5000 mg | INTRAMUSCULAR | Status: DC | PRN
  Administered 2019-03-01 – 2019-03-02 (×6): 0.5 mg via INTRAVENOUS
  Filled 2019-03-01 (×6): qty 1

## 2019-03-01 MED ORDER — LACTATED RINGERS IV SOLN: INTRAVENOUS | Status: DC

## 2019-03-01 MED ORDER — INSULIN ASPART 100 UNIT/ML ~~LOC~~ SOLN
0.0000 [IU] | SUBCUTANEOUS | Status: DC
  Administered 2019-03-01: 5 [IU] via SUBCUTANEOUS

## 2019-03-01 MED ORDER — METOCLOPRAMIDE HCL 5 MG/ML IJ SOLN
5.0000 mg | Freq: Four times a day (QID) | INTRAMUSCULAR | Status: DC
  Administered 2019-03-01 – 2019-03-02 (×6): 5 mg via INTRAVENOUS
  Filled 2019-03-01 (×6): qty 2

## 2019-03-01 MED ORDER — SODIUM CHLORIDE 0.9 % IV SOLN: INTRAVENOUS | Status: DC

## 2019-03-01 MED ORDER — HYDROMORPHONE HCL 1 MG/ML IJ SOLN
0.5000 mg | Freq: Once | INTRAMUSCULAR | Status: AC | PRN
  Administered 2019-03-01: 0.5 mg via INTRAVENOUS

## 2019-03-01 MED ORDER — INSULIN REGULAR(HUMAN) IN NACL 100-0.9 UT/100ML-% IV SOLN
INTRAVENOUS | Status: AC
  Administered 2019-03-01: 8 [IU]/h via INTRAVENOUS
  Filled 2019-03-01 (×2): qty 100

## 2019-03-01 MED ORDER — INSULIN GLARGINE 100 UNIT/ML ~~LOC~~ SOLN
12.0000 [IU] | Freq: Two times a day (BID) | SUBCUTANEOUS | Status: DC
  Administered 2019-03-01: 18:00:00 12 [IU] via SUBCUTANEOUS
  Filled 2019-03-01: qty 0.12

## 2019-03-01 MED ORDER — DEXTROSE-NACL 5-0.45 % IV SOLN: INTRAVENOUS | Status: DC

## 2019-03-01 NOTE — Progress Notes (Signed)
Inpatient Diabetes Program Recommendations  AACE/ADA: New Consensus Statement on Inpatient Glycemic Control (2015)  Target Ranges:  Prepandial:   less than 140 mg/dL      Peak postprandial:   less than 180 mg/dL (1-2 hours)      Critically ill patients:  140 - 180 mg/dL   Lab Results  Component Value Date   GLUCAP 299 (H) 03/01/2019   HGBA1C 7.8 (H) 02/18/2019    Review of Glycemic Control  Diabetes history: DM1 Outpatient Diabetes medications: insulin pump Current orders for Inpatient glycemic control: IV insulin per [facility] Previously on Lantus 12 units QD, Novolog 0-9 units Q4H  HgbA1C - 7.8% - doubtful this is accurate with low H/H when drawn on 02/18/19.  Back in DKA - CO2 this am 10, then 7 AG - 17. Very poor po intake - now NPO.  Inpatient Diabetes Program Recommendations:     Continue IV insulin until criteria met for transitioning to SQ insulin. Lantus dose at home is 30 units QD. Has been on Lantus 25 units and had hypoglycemia. Basal rate 1.4/H = 33.6 units/day I:C ratio - 10 CF-66  Pt has been instructed to f/u with her Endo, Dr Francetta Found, every 3 months. In 2020, she saw him twice - on 01/26/18 and 12/08/18. Per Dr Hartford Poli: "Arodkika's infrequent follow-ups with me and erratic blood sugar monitoring has made it difficult for me to give good recommendations concerning her diabetes management." Instructed her to do finger stick glucoses at least 6x/day.  When transitioning off insulin drip, give basal 1-2 hours prior to discontinuation of drip. Lantus 20-25 units Q24H. Novolog 0-9 units Q4H. When eating, will need meal coverage insulin. Her CHO ratio is 1:10, which calculates to 6 units tidwc (if pt ate 100% of CHO mod med diet.)  NOTE: Hospitalizations/ ED visits:  12//02/20 - [facility] 12/24/18 - 12/29/18 - [facility] 12/31/18 - 12/31/18 - [facility] 01/14/19 - 01/20/19 - [facility] 01/21/19 - 01/22/19 - [facility] 01/24/19 - 01/29/19 - [place] 01/31/19 - 02/08/19 - [place] 02/09/19 -  02/14/19 - [place] 02/16/19 - 02/23/19 - [facility] 02/24/19 - 02/24/19 - [facility] 02/26/19 - present - [facility]  Recommend psych consult.  Thank you. Lorenda Peck, RD, LDN, CDE Inpatient Diabetes Coordinator [phone]

## 2019-03-01 NOTE — Progress Notes (Signed)
Patient removed her insulin pump. Placed pump ina bag with her patient sticker on it and put it with patient belongings. Patient is aware of where the pump was put.

## 2019-03-01 NOTE — Progress Notes (Addendum)
PROGRESS NOTE    [name]  ZGY:[id number] DOB: Apr 30, 1990 DOA: 02/26/2019 PCP: Vicenta Aly, FNP   Brief Narrative:  This is [name] 29 year old female with history of type 1 diabetes on insulin pump, recurrent DKA, multiple hospitalizations for complications of gastroparesis, intractable cyclical vomiting, hypertension, GERD, asthma with recent hospitalization from 1/30-2/4 for similar complaints and has received care from both [facility] health, [facility] and was admitted on 2/7 for intractable nausea and vomiting.  Assessment & Plan:   Active Problems:   Gastroparesis   Intractable cyclical vomiting with nausea  1. Hyperglycemia with concern for DKA  Anion Gap Metabolic Acidosis 1. Worsening AGMA this morning -> ketones in urine, beta hydroxybutyric acid pending - concerning for DKA in setting of hyperglycemia, though no acidemia on VBG 2. Follow lactate 3. Will transfer to stepdown for insulin gtt, maintain NPO status 4. Serial BMP 5. Follow for resumption of home insulin regimen  2. Intractable cyclical nausea and vomiting with history of diabetic gastroparesis [name]. Gastric emptying study6/21/2018 with significantly delayed gastric emptying i. 2% emptied at 1 hr ( normal >= 10%) ii. 16% emptied at 2 hr ( normal >= 40%) iii. 20% emptied at 3 hr ( normal >= 70%) iv. 24% emptied at 4 hr ( normal >= 90%) b. UDS negative for THC or other substances c. I wonder if her recurrent symptoms are from gastroparesis exacerbated by chronic medication use and/or withdrawal from meds once she goes home (not getting IV) leading to frequent hospitalizations d. Continue Phenergan and Dilaudid for now, resume reglan  e. Still not tolerating PO intake, continue NPO for now  3. 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 Possibly exacerbated from anticholinergic effect of Phenergan b. Recommend taper as above c. Retaining >999 on bladder scan ->  place foley, consider trial of void within next 24-48 hrs d. Follow UA and culture  4. Hypoglycemia due to decreased p.o. intake and insulin Resolved   5. Poorly controlled type 1 diabetes [name]. HA1C 7.8 b. Insulin gtt as noted above c. Diabetic coordinator consult  6. Abnormal UA ?interstitial cystitis [name]. Consider amitriptyline if symptomatic b. Needs outpatient f/u for hematuria  DVT prophylaxis: lovenox Code Status: full Family Communication: none at bedside - mom, [name] Disposition Plan:  . Patient came from: home           . Anticipated d/c place:  . Barriers to d/c OR conditions which need to be met to effect [name] safe d/c:  Consultants:   none  Procedures:  none  Antimicrobials:  Anti-infectives (From admission, onward)   None     Subjective: C/o abdominal pain Doesn't feel well in general  Objective: Vitals:   02/28/19 1337 02/28/19 2227 03/01/19 0600 03/01/19 1240  BP: 112/67 (!) 138/99 121/77 111/69  Pulse: 97 (!) 110 (!) 109 (!) 102  Resp: 16 16 16 20   Temp: 98.8 F (37.1 C) 99.4 F (37.4 C) 98.3 F (36.8 C) (!) 97.5 F (36.4 C)  TempSrc: Oral Oral Oral Oral  SpO2: 98% 99% 100% 94%  Weight:      Height:        Intake/Output Summary (Last 24 hours) at 03/01/2019 1318 Last data filed at 03/01/2019 1200 Gross per 24 hour  Intake 2483.48 ml  Output 5200 ml  Net -2716.52 ml   Filed Weights   02/26/19 0913  Weight: 54.4 kg    Examination:  General exam: Appears calm and comfortable  Respiratory system: Clear  to auscultation. Respiratory effort normal. Cardiovascular system: S1 & S2 heard, RRR.  Gastrointestinal system: Abdomen is nondistended, soft and diffusely mildly tender. Central nervous system: Alert and oriented. No focal neurological deficits. Extremities: no LEE Skin: No rashes, lesions or ulcers Psychiatry: Judgement and insight appear normal. Mood & affect appropriate.     Data Reviewed: I have personally reviewed following  labs and imaging studies  CBC: Recent Labs  Lab 02/24/19 0954 02/26/19 0958 02/27/19 0409 02/28/19 0545 03/01/19 0654  WBC 11.7* 7.2 8.1 8.4 10.5  NEUTROABS 9.4* 4.0  --   --   --   HGB 9.3* 8.8* 9.2* 8.9* 9.1*  HCT 30.8* 28.4* 30.3* 30.2* 31.1*  MCV 83.2 82.3 82.3 85.1 87.1  PLT 444* 388 415* 372 681*   Basic Metabolic Panel: Recent Labs  Lab 02/26/19 0958 02/27/19 0409 02/28/19 0545 03/01/19 0654 03/01/19 1103  NA 135 140 136 134* 133*  K 3.4* 3.4* 3.8 4.5 4.7  CL 100 106 106 107 109  CO2 23 25 22  10* 7*  GLUCOSE 223* 130* 237* 350* 351*  BUN 13 12 10 12 13   CREATININE 0.66 0.85 0.80 0.93 0.93  CALCIUM 8.7* 9.0 8.7* 8.7* 8.6*  MG  --  1.5* 1.8  --   --   PHOS  --  3.5  --   --   --    GFR: Estimated Creatinine Clearance: 76.7 mL/min (by C-G formula based on SCr of 0.93 mg/dL). Liver Function Tests: Recent Labs  Lab 02/24/19 0954 02/26/19 0958 02/28/19 0545 03/01/19 0654  AST 13* 12* 12* 10*  ALT 10 9 10 11   ALKPHOS 77 67 77 81  BILITOT 1.1 1.1 1.0 1.3*  PROT 7.2 6.5 6.7 6.8  ALBUMIN 3.9 3.6 3.4* 3.5   Recent Labs  Lab 02/24/19 0954 02/26/19 0958  LIPASE 14 15   No results for input(s): AMMONIA in the last 168 hours. Coagulation Profile: No results for input(s): INR, PROTIME in the last 168 hours. Cardiac Enzymes: No results for input(s): CKTOTAL, CKMB, CKMBINDEX, TROPONINI in the last 168 hours. BNP (last 3 results) No results for input(s): PROBNP in the last 8760 hours. HbA1C: No results for input(s): HGBA1C in the last 72 hours. CBG: Recent Labs  Lab 02/28/19 1202 02/28/19 1724 02/28/19 2135 03/01/19 0746 03/01/19 1217  GLUCAP 70 285* 147* 324* 299*   Lipid Profile: No results for input(s): CHOL, HDL, LDLCALC, TRIG, CHOLHDL, LDLDIRECT in the last 72 hours. Thyroid Function Tests: No results for input(s): TSH, T4TOTAL, FREET4, T3FREE, THYROIDAB in the last 72 hours. Anemia Panel: No results for input(s): VITAMINB12, FOLATE,  FERRITIN, TIBC, IRON, RETICCTPCT in the last 72 hours. Sepsis Labs: No results for input(s): PROCALCITON, LATICACIDVEN in the last 168 hours.  Recent Results (from the past 240 hour(s))  Urine culture     Status: Abnormal   Collection Time: 02/24/19 10:04 AM   Specimen: Urine, Random  Result Value Ref Range Status   Specimen Description   Final    URINE, RANDOM Performed at [facility] [address] [place], [place] [address]    Special Requests   Final    NONE Performed at [facility], [place] [address] [place], [place] [address]    Culture ([place])  Final    <10,000 COLONIES/mL INSIGNIFICANT GROWTH Performed at [facility] [address], [place], [place] [address]    Report Status 02/26/2019 FINAL  Final  Urine culture     Status:  None   Collection Time: 02/26/19  4:26 PM   Specimen: Urine, Random  Result Value Ref Range Status   Specimen Description   Final    URINE, RANDOM Performed at [facility] [address] [place], [place] [address]    Special Requests   Final    NONE Performed at [facility], [place] [address], [place], [place] [address]    Culture   Final    NO GROWTH Performed at [facility], [place] [address] [place], [place] [address]    Report Status 02/27/2019 FINAL  Final  SARS CORONAVIRUS 2 (TAT 6-24 HRS) Nasopharyngeal Nasopharyngeal Swab     Status: None   Collection Time: 02/26/19  4:32 PM   Specimen: Nasopharyngeal Swab  Result Value Ref Range Status   SARS Coronavirus 2 NEGATIVE NEGATIVE Final    Comment: (NOTE) SARS-CoV-2 target nucleic acids are NOT DETECTED. The SARS-CoV-2 RNA is generally detectable in upper and lower respiratory specimens during the acute phase of infection. Negative results do not preclude SARS-CoV-2 infection, do not rule out co-infections with other pathogens, and should not be used as the sole basis for treatment or other patient  management decisions. Negative results must be combined with clinical observations, patient history, and epidemiological information. The expected result is Negative. Fact Sheet for Patients: SugarRoll.be Fact Sheet for Healthcare Providers: https://www.woods-mathews.com/ This test is not yet approved or cleared by the [place] FDA and  has been authorized for detection and/or diagnosis of SARS-CoV-2 by FDA under an Emergency Use Authorization (EUA). This EUA will remain  in effect (meaning this test can be used) for the duration of the COVID-19 declaration under Section 56 4(b)(1) of the Act, 21 U.S.C. section 360bbb-3(b)(1), unless the authorization is terminated or revoked sooner. Performed at [facility], [place] [address], [place], [place] [address]          Radiology Studies: No results found.      Scheduled Meds: . Chlorhexidine Gluconate Cloth  6 each Topical Daily  . enoxaparin (LOVENOX) injection  40 mg Subcutaneous Q24H  . mouth rinse  15 mL Mouth Rinse BID  . metoCLOPramide (REGLAN) injection  5 mg Intravenous Q6H  . senna  1 tablet Oral BID  . sodium chloride flush  3 mL Intravenous Q12H   Continuous Infusions: . sodium chloride 75 mL/hr at 03/01/19 1252  . dextrose 5 % and 0.45% NaCl    . insulin       LOS: 2 days    Time spent: over 30 min    Fayrene Helper, MD [facility]   To contact the attending provider between 7A-7P or the covering provider during after hours 7P-7A, please log into the web site [email or web address] and access using universal [place] password for that web site. If you do not have the password, please call the hospital operator.  03/01/2019, 1:18 PM

## 2019-03-02 DIAGNOSIS — R112 Nausea with vomiting, unspecified: Secondary | ICD-10-CM

## 2019-03-02 LAB — GLUCOSE, CAPILLARY
Glucose-Capillary: 108 mg/dL — ABNORMAL HIGH (ref 70–99)
Glucose-Capillary: 115 mg/dL — ABNORMAL HIGH (ref 70–99)
Glucose-Capillary: 119 mg/dL — ABNORMAL HIGH (ref 70–99)
Glucose-Capillary: 127 mg/dL — ABNORMAL HIGH (ref 70–99)
Glucose-Capillary: 139 mg/dL — ABNORMAL HIGH (ref 70–99)
Glucose-Capillary: 145 mg/dL — ABNORMAL HIGH (ref 70–99)
Glucose-Capillary: 151 mg/dL — ABNORMAL HIGH (ref 70–99)
Glucose-Capillary: 174 mg/dL — ABNORMAL HIGH (ref 70–99)
Glucose-Capillary: 71 mg/dL (ref 70–99)
Glucose-Capillary: 75 mg/dL (ref 70–99)
Glucose-Capillary: 82 mg/dL (ref 70–99)
Glucose-Capillary: 83 mg/dL (ref 70–99)
Glucose-Capillary: 85 mg/dL (ref 70–99)
Glucose-Capillary: 87 mg/dL (ref 70–99)
Glucose-Capillary: 99 mg/dL (ref 70–99)
Glucose-Capillary: 99 mg/dL (ref 70–99)

## 2019-03-02 LAB — COMPREHENSIVE METABOLIC PANEL
ALT: 9 U/L (ref 0–44)
AST: 8 U/L — ABNORMAL LOW (ref 15–41)
Albumin: 3 g/dL — ABNORMAL LOW (ref 3.5–5.0)
Alkaline Phosphatase: 67 U/L (ref 38–126)
Anion gap: 9 (ref 5–15)
BUN: 9 mg/dL (ref 6–20)
CO2: 16 mmol/L — ABNORMAL LOW (ref 22–32)
Calcium: 8.4 mg/dL — ABNORMAL LOW (ref 8.9–10.3)
Chloride: 111 mmol/L (ref 98–111)
Creatinine, Ser: 0.58 mg/dL (ref 0.44–1.00)
GFR calc Af Amer: >60 mL/min (ref >60)
GFR calc non Af Amer: >60 mL/min (ref >60)
Glucose, Bld: 119 mg/dL — ABNORMAL HIGH (ref 70–99)
Potassium: 3.6 mmol/L (ref 3.5–5.1)
Sodium: 136 mmol/L (ref 135–145)
Total Bilirubin: 0.9 mg/dL (ref 0.3–1.2)
Total Protein: 6.1 g/dL — ABNORMAL LOW (ref 6.5–8.1)

## 2019-03-02 LAB — CBC
HCT: 28.4 % — ABNORMAL LOW (ref 36.0–46.0)
Hemoglobin: 8.6 g/dL — ABNORMAL LOW (ref 12.0–15.0)
MCH: 25.6 pg — ABNORMAL LOW (ref 26.0–34.0)
MCHC: 30.3 g/dL (ref 30.0–36.0)
MCV: 84.5 fL (ref 80.0–100.0)
Platelets: 364 10*3/uL (ref 150–400)
RBC: 3.36 MIL/uL — ABNORMAL LOW (ref 3.87–5.11)
RDW: 17.3 % — ABNORMAL HIGH (ref 11.5–15.5)
WBC: 8.2 10*3/uL (ref 4.0–10.5)
nRBC: 0 % (ref 0.0–0.2)

## 2019-03-02 LAB — URINE CULTURE: Culture: NO GROWTH

## 2019-03-02 LAB — PHOSPHORUS: Phosphorus: 2.8 mg/dL (ref 2.5–4.6)

## 2019-03-02 LAB — MAGNESIUM: Magnesium: 1.5 mg/dL — ABNORMAL LOW (ref 1.7–2.4)

## 2019-03-02 MED ORDER — INSULIN ASPART 100 UNIT/ML ~~LOC~~ SOLN: 0.0000 [IU] | Freq: Every day | SUBCUTANEOUS | Status: DC

## 2019-03-02 MED ORDER — INSULIN REGULAR(HUMAN) IN NACL 100-0.9 UT/100ML-% IV SOLN
INTRAVENOUS | Status: AC
  Administered 2019-03-02: 02:00:00 0.3 [IU]/h via INTRAVENOUS
  Filled 2019-03-02: qty 100

## 2019-03-02 MED ORDER — INSULIN GLARGINE 100 UNIT/ML ~~LOC~~ SOLN
5.0000 [IU] | Freq: Every day | SUBCUTANEOUS | Status: DC
  Filled 2019-03-02 (×2): qty 0.05

## 2019-03-02 MED ORDER — DEXTROSE 50 % IV SOLN: 0.0000 mL | INTRAVENOUS | Status: DC | PRN

## 2019-03-02 MED ORDER — LACTATED RINGERS IV BOLUS: 500.0000 mL | Freq: Once | INTRAVENOUS | Status: DC

## 2019-03-02 MED ORDER — LACTATED RINGERS IV SOLN: INTRAVENOUS | Status: DC

## 2019-03-02 MED ORDER — INSULIN ASPART 100 UNIT/ML ~~LOC~~ SOLN
0.0000 [IU] | SUBCUTANEOUS | Status: DC
  Administered 2019-03-02: 2 [IU] via SUBCUTANEOUS
  Administered 2019-03-02: 1 [IU] via SUBCUTANEOUS
  Administered 2019-03-03: 5 [IU] via SUBCUTANEOUS
  Administered 2019-03-03 (×2): 3 [IU] via SUBCUTANEOUS
  Administered 2019-03-04: 2 [IU] via SUBCUTANEOUS
  Administered 2019-03-04: 3 [IU] via SUBCUTANEOUS
  Administered 2019-03-04: 2 [IU] via SUBCUTANEOUS
  Administered 2019-03-05: 17:00:00 5 [IU] via SUBCUTANEOUS
  Administered 2019-03-05: 2 [IU] via SUBCUTANEOUS
  Administered 2019-03-05: 01:00:00 5 [IU] via SUBCUTANEOUS
  Administered 2019-03-05: 04:00:00 2 [IU] via SUBCUTANEOUS
  Administered 2019-03-05: 5 [IU] via SUBCUTANEOUS
  Administered 2019-03-06: 01:00:00 7 [IU] via SUBCUTANEOUS

## 2019-03-02 MED ORDER — MAGNESIUM SULFATE 2 GM/50ML IV SOLN
2.0000 g | Freq: Once | INTRAVENOUS | Status: AC
  Administered 2019-03-02: 2 g via INTRAVENOUS
  Filled 2019-03-02: qty 50

## 2019-03-02 MED ORDER — INSULIN GLARGINE 100 UNIT/ML ~~LOC~~ SOLN
10.0000 [IU] | Freq: Two times a day (BID) | SUBCUTANEOUS | Status: DC
  Filled 2019-03-02: qty 0.1

## 2019-03-02 MED ORDER — METOCLOPRAMIDE HCL 5 MG/ML IJ SOLN
10.0000 mg | Freq: Three times a day (TID) | INTRAMUSCULAR | Status: DC
  Administered 2019-03-02 – 2019-03-05 (×8): 10 mg via INTRAVENOUS
  Filled 2019-03-02 (×8): qty 2

## 2019-03-02 MED ORDER — DEXTROSE-NACL 5-0.45 % IV SOLN: INTRAVENOUS | Status: DC

## 2019-03-02 MED ORDER — INSULIN GLARGINE 100 UNIT/ML ~~LOC~~ SOLN
8.0000 [IU] | Freq: Two times a day (BID) | SUBCUTANEOUS | Status: DC
  Administered 2019-03-02 (×2): 8 [IU] via SUBCUTANEOUS
  Filled 2019-03-02 (×3): qty 0.08

## 2019-03-02 MED ORDER — METOCLOPRAMIDE HCL 5 MG/ML IJ SOLN: 10.0000 mg | Freq: Four times a day (QID) | INTRAMUSCULAR | Status: DC

## 2019-03-02 MED ORDER — HYDROMORPHONE HCL 1 MG/ML IJ SOLN
0.5000 mg | INTRAMUSCULAR | Status: DC | PRN
  Administered 2019-03-02 – 2019-03-03 (×4): 0.5 mg via INTRAVENOUS
  Filled 2019-03-02 (×4): qty 1

## 2019-03-02 MED ORDER — INSULIN ASPART 100 UNIT/ML ~~LOC~~ SOLN: 0.0000 [IU] | Freq: Three times a day (TID) | SUBCUTANEOUS | Status: DC

## 2019-03-02 MED ORDER — MAGNESIUM OXIDE 400 (241.3 MG) MG PO TABS
400.0000 mg | ORAL_TABLET | Freq: Every day | ORAL | Status: AC
  Administered 2019-03-02: 400 mg via ORAL
  Filled 2019-03-02: qty 1

## 2019-03-02 MED ORDER — SODIUM BICARBONATE 650 MG PO TABS
650.0000 mg | ORAL_TABLET | Freq: Three times a day (TID) | ORAL | Status: DC
  Administered 2019-03-02 – 2019-03-03 (×3): 650 mg via ORAL
  Filled 2019-03-02 (×3): qty 1

## 2019-03-02 MED ORDER — SODIUM CHLORIDE 0.9 % IV SOLN: INTRAVENOUS | Status: DC

## 2019-03-02 NOTE — Progress Notes (Addendum)
PROGRESS NOTE    [name]  JHE:[id number] DOB: October 30, 1990 DOA: 02/26/2019 PCP: Vicenta Aly, FNP   Brief Narrative:  This is [name] 29 year old female with history of type 1 diabetes on insulin pump, recurrent DKA, multiple hospitalizations for complications of gastroparesis, intractable cyclical vomiting, hypertension, GERD, asthma with recent hospitalization from 1/30-2/4 for similar complaints and has received care from both [facility], [facility] and was admitted on 2/7 for intractable nausea and vomiting.  Assessment & Plan:   Active Problems:   Gastroparesis   Intractable cyclical vomiting with nausea  1. Hyperglycemia with concern for DKA  Anion Gap Metabolic Acidosis  Non Gap Metabolic Acidosis 1. Worsening AGMA this morning -> ketones in urine, beta hydroxybutyric acid elevated as well- concerning for DKA in setting of hyperglycemia, though no acidemia on VBG 2. BG improved with improved AG -> residual non anion gap acidosis is likely related to hyperchloremia from NS administration 3. Follow lactate (wnl) 4. Start lantus 8 units BID with sensitive SSI - adjust as necessary 5. Will try PO bicarb and follow  2. Intractable cyclical nausea and vomiting with history of diabetic gastroparesis [name]. Gastric emptying study6/21/2018 with significantly delayed gastric emptying i. 2% emptied at 1 hr ( normal >= 10%) ii. 16% emptied at 2 hr ( normal >= 40%) iii. 20% emptied at 3 hr ( normal >= 70%) iv. 24% emptied at 4 hr ( normal >= 90%) b. UDS negative for THC or other substances c. I wonder if her recurrent symptoms are from gastroparesis exacerbated by chronic medication use and/or withdrawal from meds once she goes home (not getting IV) leading to frequent hospitalizations d. Continue Phenergan and Dilaudid for now.  resume reglan.  Will need to work on minimizing narcotics over next few days.  Discussed with pt who understands.   e. Has carb mod diet  ordered - will follow if she's able to tolerate this  3. 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 Possibly exacerbated from anticholinergic effect of Phenergan b. Recommend taper as above c. Retaining >999 on bladder scan -> place foley, consider trial of void within next 24-48 hrs d. Follow UA (see below) and culture -> no growth  4. Hypoglycemia due to decreased p.o. intake and insulin Resolved   5. Poorly controlled type 1 diabetes [name]. HA1C 7.8 b. Insulin gtt as noted above c. Diabetic coordinator consult  6. Abnormal UA ?interstitial cystitis  Hematuria  Proteinuria  Pyuria [name]. Consider amitriptyline if symptomatic b. Needs outpatient f/u for hematuria  She's been in and out of hospital frequently over past several weeks to months, usually not home more than [name] day or 2 between ED visits or hospitalizations.  DVT prophylaxis: lovenox Code Status: full Family Communication: none at bedside - mom, [name] 2/11 Disposition Plan:  . Patient came from: home           . Anticipated d/c place:  . Barriers to d/c OR conditions which need to be met to effect Akshay Spang safe d/c:  Consultants:   none  Procedures:  none  Antimicrobials:  Anti-infectives (From admission, onward)   None     Subjective: Feels [name] little better than yesterday  Objective: Vitals:   03/02/19 1000 03/02/19 1100 03/02/19 1200 03/02/19 1300  BP: 116/78 118/84 (!) 86/43 120/72  Pulse: 100 (!) 104 97 100  Resp: 12 20 14 16   Temp:   99.1 F (37.3 C)   TempSrc:   Oral  SpO2: 98% 100% 98% 98%  Weight:      Height:        Intake/Output Summary (Last 24 hours) at 03/02/2019 1349 Last data filed at 03/02/2019 1200 Gross per 24 hour  Intake 2945.44 ml  Output 1275 ml  Net 1670.44 ml   Filed Weights   02/26/19 0913  Weight: 54.4 kg    Examination:  General: No acute distress.  Thin.  Cardiovascular: Heart sounds show Camala Talwar regular rate, and rhythm. Lungs: Clear to  auscultation bilaterally  Abdomen: Soft, nontender, nondistended  Neurological: Alert and oriented 3. Moves all extremities 4 . Cranial nerves II through XII grossly intact. Skin: Warm and dry. No rashes or lesions. Extremities: No clubbing or cyanosis. No edema.   Data Reviewed: I have personally reviewed following labs and imaging studies  CBC: Recent Labs  Lab 02/24/19 0954 02/24/19 0954 02/26/19 0958 02/27/19 0409 02/28/19 0545 03/01/19 0654 03/02/19 0440  WBC 11.7*   < > 7.2 8.1 8.4 10.5 8.2  NEUTROABS 9.4*  --  4.0  --   --   --   --   HGB 9.3*   < > 8.8* 9.2* 8.9* 9.1* 8.6*  HCT 30.8*   < > 28.4* 30.3* 30.2* 31.1* 28.4*  MCV 83.2   < > 82.3 82.3 85.1 87.1 84.5  PLT 444*   < > 388 415* 372 407* 364   < > = values in this interval not displayed.   Basic Metabolic Panel: Recent Labs  Lab 02/27/19 0409 02/27/19 0409 02/28/19 0545 03/01/19 0654 03/01/19 1103 03/01/19 1618 03/02/19 0440  NA 140   < > 136 134* 133* 136 136  K 3.4*   < > 3.8 4.5 4.7 4.1 3.6  CL 106   < > 106 107 109 114* 111  CO2 25   < > 22 10* 7* 13* 16*  GLUCOSE 130*   < > 237* 350* 351* 188* 119*  BUN 12   < > 10 12 13 12 9   CREATININE 0.85   < > 0.80 0.93 0.93 0.90 0.58  CALCIUM 9.0   < > 8.7* 8.7* 8.6* 8.9 8.4*  MG 1.5*  --  1.8  --   --   --  1.5*  PHOS 3.5  --   --   --   --   --  2.8   < > = values in this interval not displayed.   GFR: Estimated Creatinine Clearance: 89.1 mL/min (by C-G formula based on SCr of 0.58 mg/dL). Liver Function Tests: Recent Labs  Lab 02/24/19 0954 02/26/19 0958 02/28/19 0545 03/01/19 0654 03/02/19 0440  AST 13* 12* 12* 10* 8*  ALT 10 9 10 11 9   ALKPHOS 77 67 77 81 67  BILITOT 1.1 1.1 1.0 1.3* 0.9  PROT 7.2 6.5 6.7 6.8 6.1*  ALBUMIN 3.9 3.6 3.4* 3.5 3.0*   Recent Labs  Lab 02/24/19 0954 02/26/19 0958  LIPASE 14 15   No results for input(s): AMMONIA in the last 168 hours. Coagulation Profile: No results for input(s): INR, PROTIME in the last  168 hours. Cardiac Enzymes: No results for input(s): CKTOTAL, CKMB, CKMBINDEX, TROPONINI in the last 168 hours. BNP (last 3 results) No results for input(s): PROBNP in the last 8760 hours. HbA1C: No results for input(s): HGBA1C in the last 72 hours. CBG: Recent Labs  Lab 03/02/19 0853 03/02/19 0958 03/02/19 1058 03/02/19 1213 03/02/19 1323  GLUCAP 119* 83 82 71 127*   Lipid Profile: No results  for input(s): CHOL, HDL, LDLCALC, TRIG, CHOLHDL, LDLDIRECT in the last 72 hours. Thyroid Function Tests: No results for input(s): TSH, T4TOTAL, FREET4, T3FREE, THYROIDAB in the last 72 hours. Anemia Panel: No results for input(s): VITAMINB12, FOLATE, FERRITIN, TIBC, IRON, RETICCTPCT in the last 72 hours. Sepsis Labs: Recent Labs  Lab 03/01/19 1401  LATICACIDVEN 0.9    Recent Results (from the past 240 hour(s))  Urine culture     Status: Abnormal   Collection Time: 02/24/19 10:04 AM   Specimen: Urine, Random  Result Value Ref Range Status   Specimen Description   Final    URINE, RANDOM Performed at [facility] [address] [place], [place] [address]    Special Requests   Final    NONE Performed at [facility], [place] [address] [place], [place] [address]    Culture (Vertie Dibbern)  Final    <10,000 COLONIES/mL INSIGNIFICANT GROWTH Performed at [facility] [address] [place], [place] [address]    Report Status 02/26/2019 FINAL  Final  Urine culture     Status: None   Collection Time: 02/26/19  4:26 PM   Specimen: Urine, Random  Result Value Ref Range Status   Specimen Description   Final    URINE, RANDOM Performed at [facility] [address], [place], [place] [address]    Special Requests   Final    NONE Performed at [facility], [place] [address], [place], [place] [address]    Culture   Final    NO GROWTH Performed at [facility], [place] [address] [place], [place] [address]     Report Status 02/27/2019 FINAL  Final  SARS CORONAVIRUS 2 (TAT 6-24 HRS) Nasopharyngeal Nasopharyngeal Swab     Status: None   Collection Time: 02/26/19  4:32 PM   Specimen: Nasopharyngeal Swab  Result Value Ref Range Status   SARS Coronavirus 2 NEGATIVE NEGATIVE Final    Comment: (NOTE) SARS-CoV-2 target nucleic acids are NOT DETECTED. The SARS-CoV-2 RNA is generally detectable in upper and lower respiratory specimens during the acute phase of infection. Negative results do not preclude SARS-CoV-2 infection, do not rule out co-infections with other pathogens, and should not be used as the sole basis for treatment or other patient management decisions. Negative results must be combined with clinical observations, patient history, and epidemiological information. The expected result is Negative. Fact Sheet for Patients: SugarRoll.be Fact Sheet for Healthcare Providers: https://www.woods-mathews.com/ This test is not yet approved or cleared by the [place] FDA and  has been authorized for detection and/or diagnosis of SARS-CoV-2 by FDA under an Emergency Use Authorization (EUA). This EUA will remain  in effect (meaning this test can be used) for the duration of the COVID-19 declaration under Section 56 4(b)(1) of the Act, 21 U.S.C. section 360bbb-3(b)(1), unless the authorization is terminated or revoked sooner. Performed at [facility], [place] [address], [place], [place] [address]   Culture, Urine     Status: None   Collection Time: 03/01/19 10:25 AM   Specimen: Urine, Clean Catch  Result Value Ref Range Status   Specimen Description   Final    URINE, CLEAN CATCH Performed at [facility], [place] [address], [place], [place] [address]    Special Requests   Final    NONE Performed at [facility], [place] [address] [place], [place] [address]    Culture   Final  NO GROWTH Performed at [facility], [place] [address], [place], [place] [address]    Report Status 03/02/2019 FINAL  Final  MRSA PCR Screening     Status: None   Collection Time: 03/01/19  1:14 PM   Specimen: Nasopharyngeal  Result Value Ref Range Status   MRSA by PCR NEGATIVE NEGATIVE Final    Comment:        The GeneXpert MRSA Assay (FDA approved for NASAL specimens only), is one component of Analilia Geddis comprehensive MRSA colonization surveillance program. It is not intended to diagnose MRSA infection nor to guide or monitor treatment for MRSA infections. Performed at [facility], [place] [address] [place],  [address]          Radiology Studies: No results found.      Scheduled Meds: . Chlorhexidine Gluconate Cloth  6 each Topical Daily  . enoxaparin (LOVENOX) injection  40 mg Subcutaneous Q24H  . insulin aspart  0-9 Units Subcutaneous Q4H  . insulin glargine  8 Units Subcutaneous BID  . mouth rinse  15 mL Mouth Rinse BID  . metoCLOPramide (REGLAN) injection  5 mg Intravenous Q6H  . senna  1 tablet Oral BID  . sodium bicarbonate  650 mg Oral TID  . sodium chloride flush  10-40 mL Intracatheter Q12H  . sodium chloride flush  3 mL Intravenous Q12H   Continuous Infusions: . sodium chloride    . dextrose 5 % and 0.45% NaCl Stopped (03/02/19 0823)  . lactated ringers 75 mL/hr at 03/02/19 1200     LOS: 3 days    Time spent: over 30 min    Fayrene [place], MD [facility]   To contact the attending provider between 7A-7P or the covering provider during after hours 7P-7A, please log into the web site [email or web address] and access using universal [place] password for that web site. If you do not have the password, please call the hospital operator.  03/02/2019, 1:49 PM

## 2019-03-03 LAB — CBC WITH DIFFERENTIAL/PLATELET
Abs Immature Granulocytes: 0.03 10*3/uL (ref 0.00–0.07)
Basophils Absolute: 0 10*3/uL (ref 0.0–0.1)
Basophils Relative: 0 %
Eosinophils Absolute: 0.2 10*3/uL (ref 0.0–0.5)
Eosinophils Relative: 3 %
HCT: 26 % — ABNORMAL LOW (ref 36.0–46.0)
Hemoglobin: 8.1 g/dL — ABNORMAL LOW (ref 12.0–15.0)
Immature Granulocytes: 1 %
Lymphocytes Relative: 27 %
Lymphs Abs: 1.7 10*3/uL (ref 0.7–4.0)
MCH: 25.8 pg — ABNORMAL LOW (ref 26.0–34.0)
MCHC: 31.2 g/dL (ref 30.0–36.0)
MCV: 82.8 fL (ref 80.0–100.0)
Monocytes Absolute: 0.8 10*3/uL (ref 0.1–1.0)
Monocytes Relative: 13 %
Neutro Abs: 3.4 10*3/uL (ref 1.7–7.7)
Neutrophils Relative %: 56 %
Platelets: 300 10*3/uL (ref 150–400)
RBC: 3.14 MIL/uL — ABNORMAL LOW (ref 3.87–5.11)
RDW: 17.1 % — ABNORMAL HIGH (ref 11.5–15.5)
WBC: 6.1 10*3/uL (ref 4.0–10.5)
nRBC: 0 % (ref 0.0–0.2)

## 2019-03-03 LAB — GLUCOSE, CAPILLARY
Glucose-Capillary: 79 mg/dL (ref 70–99)
Glucose-Capillary: 66 mg/dL — ABNORMAL LOW (ref 70–99)
Glucose-Capillary: 52 mg/dL — ABNORMAL LOW (ref 70–99)
Glucose-Capillary: 109 mg/dL — ABNORMAL HIGH (ref 70–99)
Glucose-Capillary: 244 mg/dL — ABNORMAL HIGH (ref 70–99)
Glucose-Capillary: 291 mg/dL — ABNORMAL HIGH (ref 70–99)
Glucose-Capillary: 215 mg/dL — ABNORMAL HIGH (ref 70–99)

## 2019-03-03 LAB — COMPREHENSIVE METABOLIC PANEL
ALT: 11 U/L (ref 0–44)
AST: 15 U/L (ref 15–41)
Albumin: 2.6 g/dL — ABNORMAL LOW (ref 3.5–5.0)
Alkaline Phosphatase: 62 U/L (ref 38–126)
Anion gap: 5 (ref 5–15)
BUN: 7 mg/dL (ref 6–20)
CO2: 23 mmol/L (ref 22–32)
Calcium: 8.1 mg/dL — ABNORMAL LOW (ref 8.9–10.3)
Chloride: 106 mmol/L (ref 98–111)
Creatinine, Ser: 0.46 mg/dL (ref 0.44–1.00)
GFR calc Af Amer: >60 mL/min (ref >60)
GFR calc non Af Amer: >60 mL/min (ref >60)
Glucose, Bld: 96 mg/dL (ref 70–99)
Potassium: 3.2 mmol/L — ABNORMAL LOW (ref 3.5–5.1)
Sodium: 134 mmol/L — ABNORMAL LOW (ref 135–145)
Total Bilirubin: 0.6 mg/dL (ref 0.3–1.2)
Total Protein: 5.4 g/dL — ABNORMAL LOW (ref 6.5–8.1)

## 2019-03-03 LAB — MAGNESIUM: Magnesium: 1.6 mg/dL — ABNORMAL LOW (ref 1.7–2.4)

## 2019-03-03 LAB — PHOSPHORUS: Phosphorus: 2.9 mg/dL (ref 2.5–4.6)

## 2019-03-03 MED ORDER — FERROUS SULFATE 325 (65 FE) MG PO TABS
325.0000 mg | ORAL_TABLET | Freq: Every day | ORAL | Status: DC
  Administered 2019-03-04 – 2019-03-06 (×3): 325 mg via ORAL
  Filled 2019-03-03 (×3): qty 1

## 2019-03-03 MED ORDER — POTASSIUM CHLORIDE 10 MEQ/100ML IV SOLN
10.0000 meq | INTRAVENOUS | Status: AC
  Administered 2019-03-03 (×3): 10 meq via INTRAVENOUS
  Filled 2019-03-03 (×3): qty 100

## 2019-03-03 MED ORDER — SODIUM CHLORIDE 0.9 % IV SOLN
510.0000 mg | Freq: Once | INTRAVENOUS | Status: AC
  Administered 2019-03-03: 510 mg via INTRAVENOUS
  Filled 2019-03-03: qty 17

## 2019-03-03 MED ORDER — HYDROMORPHONE HCL 1 MG/ML IJ SOLN
0.5000 mg | Freq: Four times a day (QID) | INTRAMUSCULAR | Status: DC | PRN
  Administered 2019-03-03 – 2019-03-04 (×3): 0.5 mg via INTRAVENOUS
  Filled 2019-03-03 (×5): qty 0.5

## 2019-03-03 MED ORDER — POTASSIUM CHLORIDE CRYS ER 20 MEQ PO TBCR
20.0000 meq | EXTENDED_RELEASE_TABLET | Freq: Two times a day (BID) | ORAL | Status: AC
  Administered 2019-03-03 (×2): 20 meq via ORAL
  Filled 2019-03-03 (×2): qty 1

## 2019-03-03 MED ORDER — INSULIN ASPART 100 UNIT/ML ~~LOC~~ SOLN
4.0000 [IU] | Freq: Three times a day (TID) | SUBCUTANEOUS | Status: DC
  Administered 2019-03-03 – 2019-03-04 (×3): 4 [IU] via SUBCUTANEOUS

## 2019-03-03 MED ORDER — MAGNESIUM SULFATE 2 GM/50ML IV SOLN
2.0000 g | Freq: Once | INTRAVENOUS | Status: AC
  Administered 2019-03-03: 2 g via INTRAVENOUS
  Filled 2019-03-03: qty 50

## 2019-03-03 MED ORDER — POTASSIUM CHLORIDE 10 MEQ/100ML IV SOLN
10.0000 meq | Freq: Once | INTRAVENOUS | Status: AC
  Administered 2019-03-03: 10 meq via INTRAVENOUS

## 2019-03-03 MED ORDER — INSULIN GLARGINE 100 UNIT/ML ~~LOC~~ SOLN
6.0000 [IU] | Freq: Two times a day (BID) | SUBCUTANEOUS | Status: DC
  Administered 2019-03-03 (×2): 6 [IU] via SUBCUTANEOUS
  Filled 2019-03-03 (×3): qty 0.06

## 2019-03-03 NOTE — TOC Initial Note (Signed)
Transition of Care [facility]) - Initial/Assessment Note    Patient Details  Name: [name] MRN: [id number] Date of Birth: 05-19-1990  Transition of [facility]) CM/SW Contact:    Lia Hopping, [place] Phone Number: 03/03/2019, 10:42 AM  Clinical Narrative:                 Readmission risk Assessment completed 81%. CSW met with the patient at beside to discuss barriers to follow up care after discharge. Patient reports, "I do not have trouble making it to my appointments, when I do have problems getting to my appointment is because I am here." Patient reports she has adequate transportation. Patient reports she can afford her medication and has family support.  Per last week encounter the patient next PCP appointment is scheduled for 2/16  Patient denies barriers.  Expected Discharge Plan: Home/Self Care Barriers to Discharge: No Barriers Identified   Patient Goals and CMS Choice        Expected Discharge Plan and Services Expected Discharge Plan: Home/Self Care   Discharge Planning Services: CM Consult   Living arrangements for the past 2 months: Single Family Home                                      Prior Living Arrangements/Services Living arrangements for the past 2 months: Single Family Home Lives with:: Minor Children, Siblings, Parents                   Activities of Daily Living Home Assistive Devices/Equipment: CBG Meter, Insulin Pump ADL Screening (condition at time of admission) Patient's cognitive ability adequate to safely complete daily activities?: Yes Is the patient deaf or have difficulty hearing?: No Does the patient have difficulty seeing, even when wearing glasses/contacts?: No Does the patient have difficulty concentrating, remembering, or making decisions?: No Patient able to express need for assistance with ADLs?: Yes Does the patient have difficulty dressing or bathing?: No Independently performs ADLs?: Yes (appropriate for  developmental age) Does the patient have difficulty walking or climbing stairs?: No Weakness of Legs: None Weakness of Arms/Hands: None  Permission Sought/Granted                  Emotional Assessment              Admission diagnosis:  Intractable nausea and vomiting [T55.7] Intractable cyclical vomiting with nausea [R11.15] Gastroparesis [K31.84] Patient Active Problem List   Diagnosis Date Noted  . Intractable cyclical vomiting with nausea 02/26/2019  . Gastroparesis due to secondary diabetes ([place]) 11/21/2018  . Diabetes mellitus ([place]) 11/13/2018  . HTN (hypertension) 11/13/2018  . Marijuana abuse 11/13/2018  . Acute pyelonephritis 10/07/2018  . Hydronephrosis of right kidney 10/05/2018  . Diffuse pain   . Elevated blood pressure reading 12/03/2017  . Tachycardia 11/30/2017  . Hypernatremia 11/02/2017  . Nausea and vomiting 01/22/2017  . Nausea & vomiting 01/21/2017  . Intractable cyclical vomiting syndrome 12/18/2016  . Leukocytosis 10/24/2016  . N&V (nausea and vomiting) 10/23/2016  . DKA, type 1 ([place]) 09/26/2016  . Thrombocytosis ([place]) 07/08/2016  . Candiduria, asymptomatic 06/23/2016  . Hyponatremia 06/13/2016  . Hematuria 05/13/2016  . UTI (urinary tract infection) 01/22/2016  . Diarrhea 11/09/2015  . Acute urinary retention   . Gastroparesis due to DM ([place]) 07/10/2015  . GERD (gastroesophageal reflux disease) 07/10/2015  . Depression with anxiety 07/10/2015  . Gastroparesis 06/22/2015  .  Altered mental status 06/22/2015  . Volume depletion 06/10/2015  . Protein-calorie malnutrition, severe 06/10/2015  . Hyperglycemia   . Elevated bilirubin   . Hematemesis with nausea   . Intractable nausea and vomiting 05/28/2015  . Abdominal pain in female   . Abdominal pain 05/24/2015  . Hypertension 05/24/2015  . Dehydration   . Diabetic gastroparesis ([place])   . Chronic diastolic heart failure ([place]) 04/11/2015  . Hematemesis 04/08/2015  . DKA (diabetic  ketoacidoses) ([place]) 03/22/2015  . S/P laparoscopic cholecystectomy 02/11/2015  . Postextubation stridor   . Pancreatitis, acute 11/26/2014  . Volume overload 11/26/2014  . Hypokalemia 11/26/2014  . Hepatic steatosis 11/26/2014  . Liver mass 11/26/2014  . Sepsis ([place]) 11/25/2014  . Sinus tachycardia 11/25/2014  . Hypomagnesemia 11/25/2014  . Hypophosphatemia 11/25/2014  . Elevated LFTs 11/24/2014  . AKI (acute kidney injury) ([place]) 11/24/2014  . Migraine headache 11/24/2014  . Asthma 06/29/2012  . Uncontrolled type 1 diabetes mellitus ([place]) 06/19/2010  . Goiter, unspecified 06/19/2010   PCP:  Vicenta Aly, [place] Pharmacy:   [id number] [place] [place] [place]Phone: [phone]: [phone]    Social Determinants of Health (SDOH) Interventions    Readmission Risk Interventions Readmission Risk Prevention Plan 03/03/2019 02/23/2019 12/26/2018  Transportation Screening Complete Complete Complete  Medication Review (RN Care Manager) Complete Complete Complete  PCP or Specialist appointment within 3-5 days of discharge Complete Not Complete Complete  PCP/Specialist Appt Not Complete comments Patient next appointment is scheduled for 2/16. got earliest apointment avaliable which is 2/16-which is 9 business days from d/c date -  [place] Complete Not Complete  HRI or Home Care Consult Pt Refusal Comments - - n/a  SW Recovery Care/Counseling Consult Complete Complete Not Complete  SW Consult Not Complete Comments - - pending need  Palliative Care Screening Not Applicable Not Applicable Not Complete  Comments - - pending need  [place]cosa[place] Applicable Not Applicable Not Applicable  Some recent data might be hidden

## 2019-03-03 NOTE — Progress Notes (Signed)
19fr Foley catheter removed as ordered. No complications noted. Foley intact upon removal. Urine clear and yellow. Resting in bed with call light within reach. Will cont to mx output.

## 2019-03-03 NOTE — Progress Notes (Signed)
PROGRESS NOTE    [name]  NLG:[id number] DOB: 15-Mar-1990 DOA: 02/26/2019 PCP: Stefanie Aly, FNP   Brief Narrative:  This is [name] 29 year old female with history of type 1 diabetes on insulin pump, recurrent DKA, multiple hospitalizations for complications of gastroparesis, intractable cyclical vomiting, hypertension, GERD, asthma with recent hospitalization from 1/30-2/4 for similar complaints and has received care from both [facility], [facility] and was admitted on 2/7 for intractable nausea and vomiting.  Assessment & Plan:   Active Problems:   Gastroparesis   Intractable cyclical vomiting with nausea  1. Hyperglycemia with concern for DKA  Anion Gap Metabolic Acidosis  Non Gap Metabolic Acidosis 1. resolved 2. Follow lactate (wnl) 3. Continue insulin as noted below  2. Poorly controlled type 1 diabetes  Hypoglycemia [name]. HA1C 7.8 b. Lantus, mealtime insulin and q4 SSI c. Diabetic coordinator consult d. Follow with below, will need to be adjusted as PO intake improves - hypoglycemia noted today, lantus decreased  3. Intractable cyclical nausea and vomiting with history of diabetic gastroparesis [name]. Gastric emptying study6/21/2018 with significantly delayed gastric emptying i. 2% emptied at 1 hr ( normal >= 10%) ii. 16% emptied at 2 hr ( normal >= 40%) iii. 20% emptied at 3 hr ( normal >= 70%) iv. 24% emptied at 4 hr ( normal >= 90%) b. UDS negative for THC or other substances c. I wonder if her recurrent symptoms are from gastroparesis exacerbated by chronic medication use and/or withdrawal from meds once she goes home (not getting IV) leading to frequent hospitalizations d. Continue Phenergan and Dilaudid for now.  resume reglan.  Will need to work on minimizing narcotics over next few days.  Discussed with pt who understands.   e. Has carb mod diet ordered - will follow if she's able to tolerate this  4. 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 Possibly exacerbated from anticholinergic effect of Phenergan b. Recommend taper as above c. Retaining >999 on bladder scan -> foley in place -> will trial without foley today 2/12 d. Follow UA (see below) and culture -> no growth  5. Iron Def Anemia 1. IV iron today 2. No si/sx bleeding  6. Hypoglycemia due to decreased p.o. intake and insulin , as above  7. Hypokalemia  hypomagnesemia: 1. Replace and follow  8. Abnormal UA ?interstitial cystitis  Hematuria  Proteinuria  Pyuria [name]. Consider amitriptyline if symptomatic b. Needs outpatient f/u for hematuria  She's been in and out of hospital frequently over past several weeks to months, usually not home more than [name] day or 2 between ED visits or hospitalizations.  DVT prophylaxis: lovenox Code Status: full Family Communication: none at bedside - mom, [name] 2/11 Disposition Plan:  . Patient came from: home           . Anticipated d/c place:  . Barriers to d/c OR conditions which need to be met to effect [name] safe d/c:  Consultants:   none  Procedures:  none  Antimicrobials:  Anti-infectives (From admission, onward)   None     Subjective: Feels [name] little better Was able to tolerate PO last night, [name] little  Objective: Vitals:   03/03/19 0829 03/03/19 0900 03/03/19 1000 03/03/19 1140  BP:    (!) 133/94  Pulse:  88 100 98  Resp:  16 (!) 8 19  Temp: (!) 100.4 F (38 C)   98.5 F (36.9 C)  TempSrc: Oral   Oral  SpO2:  100%  100% 100%  Weight:      Height:        Intake/Output Summary (Last 24 hours) at 03/03/2019 1316 Last data filed at 03/03/2019 1000 Gross per 24 hour  Intake 1682.25 ml  Output 1050 ml  Net 632.25 ml   Filed Weights   02/26/19 0913  Weight: 54.4 kg    Examination:  General: No acute distress. Cardiovascular: Heart sounds show [name] regular rate, and rhythm.  Lungs: Clear to auscultation bilaterally  Abdomen: Soft, nontender, nondistended  Neurological:  Alert and oriented 3. Moves all extremities 4. Cranial nerves II through XII grossly intact. Skin: Warm and dry. No rashes or lesions. Extremities: No clubbing or cyanosis. No edema.   Data Reviewed: I have personally reviewed following labs and imaging studies  CBC: Recent Labs  Lab 02/26/19 0958 02/26/19 0958 02/27/19 0409 02/28/19 0545 03/01/19 0654 03/02/19 0440 03/03/19 0500  WBC 7.2   < > 8.1 8.4 10.5 8.2 6.1  NEUTROABS 4.0  --   --   --   --   --  3.4  HGB 8.8*   < > 9.2* 8.9* 9.1* 8.6* 8.1*  HCT 28.4*   < > 30.3* 30.2* 31.1* 28.4* 26.0*  MCV 82.3   < > 82.3 85.1 87.1 84.5 82.8  PLT 388   < > 415* 372 407* 364 300   < > = values in this interval not displayed.   Basic Metabolic Panel: Recent Labs  Lab 02/27/19 0409 02/27/19 0409 02/28/19 0545 02/28/19 0545 03/01/19 0654 03/01/19 1103 03/01/19 1618 03/02/19 0440 03/03/19 0500  NA 140   < > 136   < > 134* 133* 136 136 134*  K 3.4*   < > 3.8   < > 4.5 4.7 4.1 3.6 3.2*  CL 106   < > 106   < > 107 109 114* 111 106  CO2 25   < > 22   < > 10* 7* 13* 16* 23  GLUCOSE 130*   < > 237*   < > 350* 351* 188* 119* 96  BUN 12   < > 10   < > 12 13 12 9 7   CREATININE 0.85   < > 0.80   < > 0.93 0.93 0.90 0.58 0.46  CALCIUM 9.0   < > 8.7*   < > 8.7* 8.6* 8.9 8.4* 8.1*  MG 1.5*  --  1.8  --   --   --   --  1.5* 1.6*  PHOS 3.5  --   --   --   --   --   --  2.8 2.9   < > = values in this interval not displayed.   GFR: Estimated Creatinine Clearance: 89.1 mL/min (by C-G formula based on SCr of 0.46 mg/dL). Liver Function Tests: Recent Labs  Lab 02/26/19 0958 02/28/19 0545 03/01/19 0654 03/02/19 0440 03/03/19 0500  AST 12* 12* 10* 8* 15  ALT 9 10 11 9 11   ALKPHOS 67 77 81 67 62  BILITOT 1.1 1.0 1.3* 0.9 0.6  PROT 6.5 6.7 6.8 6.1* 5.4*  ALBUMIN 3.6 3.4* 3.5 3.0* 2.6*   Recent Labs  Lab 02/26/19 0958  LIPASE 15   No results for input(s): AMMONIA in the last 168 hours. Coagulation Profile: No results for input(s):  INR, PROTIME in the last 168 hours. Cardiac Enzymes: No results for input(s): CKTOTAL, CKMB, CKMBINDEX, TROPONINI in the last 168 hours. BNP (last 3 results) No results for input(s): PROBNP in  the last 8760 hours. HbA1C: No results for input(s): HGBA1C in the last 72 hours. CBG: Recent Labs  Lab 03/03/19 0309 03/03/19 0749 03/03/19 0900 03/03/19 0934 03/03/19 1137  GLUCAP 79 66* 52* 109* 244*   Lipid Profile: No results for input(s): CHOL, HDL, LDLCALC, TRIG, CHOLHDL, LDLDIRECT in the last 72 hours. Thyroid Function Tests: No results for input(s): TSH, T4TOTAL, FREET4, T3FREE, THYROIDAB in the last 72 hours. Anemia Panel: No results for input(s): VITAMINB12, FOLATE, FERRITIN, TIBC, IRON, RETICCTPCT in the last 72 hours. Sepsis Labs: Recent Labs  Lab 03/01/19 1401  LATICACIDVEN 0.9    Recent Results (from the past 240 hour(s))  Urine culture     Status: Abnormal   Collection Time: 02/24/19 10:04 AM   Specimen: Urine, Random  Result Value Ref Range Status   Specimen Description   Final    URINE, RANDOM Performed at [place] [address] [place], [place] [address]    Special Requests   Final    NONE Performed at [facility], [place] [address], [place], [place] [address]    Culture (Elnathan Fulford)  Final    <10,000 COLONIES/mL INSIGNIFICANT GROWTH Performed at [facility] [address], [place], [place] [address]    Report Status 02/26/2019 FINAL  Final  Urine culture     Status: None   Collection Time: 02/26/19  4:26 PM   Specimen: Urine, Random  Result Value Ref Range Status   Specimen Description   Final    URINE, RANDOM Performed at [place] [address], [place], [place] [address]    Special Requests   Final    NONE Performed at [facility], [place] [address], [place], [place] [address]    Culture   Final    NO GROWTH Performed at [facility], [place] [address],  [place], [place] [address]    Report Status 02/27/2019 FINAL  Final  SARS CORONAVIRUS 2 (TAT 6-24 HRS) Nasopharyngeal Nasopharyngeal Swab     Status: None   Collection Time: 02/26/19  4:32 PM   Specimen: Nasopharyngeal Swab  Result Value Ref Range Status   SARS Coronavirus 2 NEGATIVE NEGATIVE Final    Comment: (NOTE) SARS-CoV-2 target nucleic acids are NOT DETECTED. The SARS-CoV-2 RNA is generally detectable in upper and lower respiratory specimens during the acute phase of infection. Negative results do not preclude SARS-CoV-2 infection, do not rule out co-infections with other pathogens, and should not be used as the sole basis for treatment or other patient management decisions. Negative results must be combined with clinical observations, patient history, and epidemiological information. The expected result is Negative. Fact Sheet for Patients: SugarRoll.be Fact Sheet for Healthcare Providers: https://www.woods-mathews.com/ This test is not yet approved or cleared by the [place] FDA and  has been authorized for detection and/or diagnosis of SARS-CoV-2 by FDA under an Emergency Use Authorization (EUA). This EUA will remain  in effect (meaning this test can be used) for the duration of the COVID-19 declaration under Section 56 4(b)(1) of the Act, 21 U.S.C. section 360bbb-3(b)(1), unless the authorization is terminated or revoked sooner. Performed at [facility], [place] [address] [place], [place] [address]   Culture, Urine     Status: None   Collection Time: 03/01/19 10:25 AM   Specimen: Urine, Clean Catch  Result Value Ref Range Status   Specimen Description   Final    URINE, CLEAN CATCH Performed at [facility]  [facility], [place] [address] [place], [place] [address]    Special Requests   Final    NONE Performed at [facility], [place] [address] [place], [place] [address]    Culture   Final    NO  GROWTH Performed at [facility], [place] [address], [place], [place] [address]    Report Status 03/02/2019 FINAL  Final  MRSA PCR Screening     Status: None   Collection Time: 03/01/19  1:14 PM   Specimen: Nasopharyngeal  Result Value Ref Range Status   MRSA by PCR NEGATIVE NEGATIVE Final    Comment:        The GeneXpert MRSA Assay (FDA approved for NASAL specimens only), is one component of Khali Albanese comprehensive MRSA colonization surveillance program. It is not intended to diagnose MRSA infection nor to guide or monitor treatment for MRSA infections. Performed at [facility], [place] [address] [place],  [address]          Radiology Studies: No results found.      Scheduled Meds: . Chlorhexidine Gluconate Cloth  6 each Topical Daily  . enoxaparin (LOVENOX) injection  40 mg Subcutaneous Q24H  . insulin aspart  0-9 Units Subcutaneous Q4H  . insulin aspart  4 Units Subcutaneous TID WC  . insulin glargine  6 Units Subcutaneous BID  . mouth rinse  15 mL Mouth Rinse BID  . metoCLOPramide (REGLAN) injection  10 mg Intravenous Q8H  . potassium chloride  20 mEq Oral BID  . senna  1 tablet Oral BID  . sodium bicarbonate  650 mg Oral TID  . sodium chloride flush  10-40 mL Intracatheter Q12H  . sodium chloride flush  3 mL Intravenous Q12H   Continuous Infusions: . ferumoxytol    . lactated ringers 75 mL/hr at 03/03/19 1000     LOS: 4 days    Time spent: over 30 min    Fayrene Helper, MD [facility]   To contact the attending provider between 7A-7P or the covering provider during after hours 7P-7A, please log into the web site [email or web address] and access using universal [place] password for that web site. If you do not have the password, please call the hospital operator.  03/03/2019, 1:16 PM

## 2019-03-04 LAB — CBC WITH DIFFERENTIAL/PLATELET
Abs Immature Granulocytes: 0.04 10*3/uL (ref 0.00–0.07)
Basophils Absolute: 0 10*3/uL (ref 0.0–0.1)
Basophils Relative: 0 %
Eosinophils Absolute: 0.1 10*3/uL (ref 0.0–0.5)
Eosinophils Relative: 2 %
HCT: 26.5 % — ABNORMAL LOW (ref 36.0–46.0)
Hemoglobin: 8.4 g/dL — ABNORMAL LOW (ref 12.0–15.0)
Immature Granulocytes: 1 %
Lymphocytes Relative: 21 %
Lymphs Abs: 1.5 10*3/uL (ref 0.7–4.0)
MCH: 26.3 pg (ref 26.0–34.0)
MCHC: 31.7 g/dL (ref 30.0–36.0)
MCV: 82.8 fL (ref 80.0–100.0)
Monocytes Absolute: 0.8 10*3/uL (ref 0.1–1.0)
Monocytes Relative: 11 %
Neutro Abs: 4.4 10*3/uL (ref 1.7–7.7)
Neutrophils Relative %: 65 %
Platelets: 328 10*3/uL (ref 150–400)
RBC: 3.2 MIL/uL — ABNORMAL LOW (ref 3.87–5.11)
RDW: 16.8 % — ABNORMAL HIGH (ref 11.5–15.5)
WBC: 6.8 10*3/uL (ref 4.0–10.5)
nRBC: 0 % (ref 0.0–0.2)

## 2019-03-04 LAB — COMPREHENSIVE METABOLIC PANEL
ALT: 10 U/L (ref 0–44)
AST: 12 U/L — ABNORMAL LOW (ref 15–41)
Albumin: 2.6 g/dL — ABNORMAL LOW (ref 3.5–5.0)
Alkaline Phosphatase: 65 U/L (ref 38–126)
Anion gap: 5 (ref 5–15)
BUN: 5 mg/dL — ABNORMAL LOW (ref 6–20)
CO2: 26 mmol/L (ref 22–32)
Calcium: 8.1 mg/dL — ABNORMAL LOW (ref 8.9–10.3)
Chloride: 104 mmol/L (ref 98–111)
Creatinine, Ser: 0.54 mg/dL (ref 0.44–1.00)
GFR calc Af Amer: >60 mL/min (ref >60)
GFR calc non Af Amer: >60 mL/min (ref >60)
Glucose, Bld: 223 mg/dL — ABNORMAL HIGH (ref 70–99)
Potassium: 3.8 mmol/L (ref 3.5–5.1)
Sodium: 135 mmol/L (ref 135–145)
Total Bilirubin: 0.6 mg/dL (ref 0.3–1.2)
Total Protein: 5.4 g/dL — ABNORMAL LOW (ref 6.5–8.1)

## 2019-03-04 LAB — GLUCOSE, CAPILLARY
Glucose-Capillary: 100 mg/dL — ABNORMAL HIGH (ref 70–99)
Glucose-Capillary: 104 mg/dL — ABNORMAL HIGH (ref 70–99)
Glucose-Capillary: 182 mg/dL — ABNORMAL HIGH (ref 70–99)
Glucose-Capillary: 189 mg/dL — ABNORMAL HIGH (ref 70–99)
Glucose-Capillary: 206 mg/dL — ABNORMAL HIGH (ref 70–99)
Glucose-Capillary: 66 mg/dL — ABNORMAL LOW (ref 70–99)

## 2019-03-04 LAB — MAGNESIUM: Magnesium: 1.6 mg/dL — ABNORMAL LOW (ref 1.7–2.4)

## 2019-03-04 LAB — PHOSPHORUS: Phosphorus: 2 mg/dL — ABNORMAL LOW (ref 2.5–4.6)

## 2019-03-04 MED ORDER — INSULIN ASPART 100 UNIT/ML ~~LOC~~ SOLN
2.0000 [IU] | Freq: Three times a day (TID) | SUBCUTANEOUS | Status: DC
  Administered 2019-03-05 (×2): 2 [IU] via SUBCUTANEOUS

## 2019-03-04 MED ORDER — PROMETHAZINE HCL 25 MG PO TABS
25.0000 mg | ORAL_TABLET | Freq: Four times a day (QID) | ORAL | Status: DC | PRN
  Administered 2019-03-04 – 2019-03-05 (×3): 25 mg via ORAL
  Filled 2019-03-04 (×4): qty 1

## 2019-03-04 MED ORDER — INSULIN GLARGINE 100 UNIT/ML ~~LOC~~ SOLN
8.0000 [IU] | Freq: Two times a day (BID) | SUBCUTANEOUS | Status: DC
  Administered 2019-03-04 – 2019-03-06 (×5): 8 [IU] via SUBCUTANEOUS
  Filled 2019-03-04 (×6): qty 0.08

## 2019-03-04 MED ORDER — MAGNESIUM SULFATE 2 GM/50ML IV SOLN
2.0000 g | Freq: Once | INTRAVENOUS | Status: AC
  Administered 2019-03-04: 2 g via INTRAVENOUS
  Filled 2019-03-04: qty 50

## 2019-03-04 NOTE — Progress Notes (Signed)
PROGRESS NOTE    [name]  RAX:[id number] DOB: Dec 11, 1990 DOA: 02/26/2019 PCP: Vicenta Aly, FNP   Brief Narrative:  This is [name] 29 year old female with history of type 1 diabetes on insulin pump, recurrent DKA, multiple hospitalizations for complications of gastroparesis, intractable cyclical vomiting, hypertension, GERD, asthma with recent hospitalization from 1/30-2/4 for similar complaints and has received care from both [facility], [facility] and [facility] and was admitted on 2/7 for intractable nausea and vomiting.  Assessment & Plan:   Active Problems:   Gastroparesis   Intractable cyclical vomiting with nausea  1. Hyperglycemia with concern for DKA  Anion Gap Metabolic Acidosis  Non Gap Metabolic Acidosis 1. resolved 2. Follow lactate (wnl) 3. Continue insulin as noted below  2. Poorly controlled type 1 diabetes  Hypoglycemia [name]. HA1C 7.8 b. Lantus, mealtime insulin and q4 SSI - adjust as needed c. Diabetic coordinator consult d. Recurrent hypoglycemia, will adjust mealtime insulin  3. Intractable cyclical nausea and vomiting with history of diabetic gastroparesis [name]. Gastric emptying study6/21/2018 with significantly delayed gastric emptying i. 2% emptied at 1 hr ( normal >= 10%) ii. 16% emptied at 2 hr ( normal >= 40%) iii. 20% emptied at 3 hr ( normal >= 70%) iv. 24% emptied at 4 hr ( normal >= 90%) b. UDS negative for THC or other substances c. I wonder if her recurrent symptoms are from gastroparesis exacerbated by chronic medication use and/or withdrawal from meds once she goes home (not getting IV) leading to frequent hospitalizations d. D/c dilaudid.  Transition to PO phenergan.  Continue IV reglan for now.  Will continue to work on transitioning to oral medications. e. Has carb mod diet ordered - will follow if she's able to tolerate this  4. 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 Possibly exacerbated from  anticholinergic effect of Phenergan b. Recommend taper as above c. Retaining >999 on bladder scan -> foley in place -> will trial without foley 2/12 - follow bladder scans d. Follow UA (see below) and culture -> no growth  5. Iron Def Anemia 1. IV iron today 2. No si/sx bleeding  6. Hypoglycemia due to decreased p.o. intake and insulin , as above  7. Hypokalemia  hypomagnesemia: 1. Replace and follow  8. Abnormal UA ?interstitial cystitis  Hematuria  Proteinuria  Pyuria [name]. Consider amitriptyline if symptomatic b. Needs outpatient f/u for hematuria  She's been in and out of hospital frequently over past several weeks to months, usually not home more than [name] day or 2 between ED visits or hospitalizations.  DVT prophylaxis: lovenox Code Status: full Family Communication: none at bedside - mom, [name] 2/11 Disposition Plan:  . Patient came from: home           . Anticipated d/c place:  . Barriers to d/c OR conditions which need to be met to effect [name] safe d/c:  Consultants:   none  Procedures:  none  Antimicrobials:  Anti-infectives (From admission, onward)   None     Subjective: Feels [name] little better Episode of emesis yesterday  Objective: Vitals:   03/03/19 1000 03/03/19 1140 03/03/19 2045 03/04/19 0413  BP:  (!) 133/94 114/74 99/69  Pulse: 100 98 (!) 102 93  Resp: (!) _0 Temp:  98.5 F (36.9 C) 100 F (37.8 C) (!) 100.6 F (38.1 C)  TempSrc:  Oral Oral Oral  SpO2: 100% 100% 100% 98%  Weight:      Height:  Intake/Output Summary (Last 24 hours) at 03/04/2019 1224 Last data filed at 03/04/2019 1043 Gross per 24 hour  Intake 2693.05 ml  Output 2705 ml  Net -11.95 ml   Filed Weights   02/26/19 0913  Weight: 54.4 kg    Examination:  General: No acute distress. Cardiovascular: Heart sounds show [name] regular rate, and rhythm. Lungs: Clear to auscultation bilaterally Abdomen: Soft, nontender, nondistended  Neurological: Alert and  oriented 3. Moves all extremities 4 . Cranial nerves II through XII grossly intact. Skin: Warm and dry. No rashes or lesions. Extremities: No clubbing or cyanosis. No edema.   Data Reviewed: I have personally reviewed following labs and imaging studies  CBC: Recent Labs  Lab 02/26/19 0958 02/27/19 0409 02/28/19 0545 03/01/19 0654 03/02/19 0440 03/03/19 0500 03/04/19 0334  WBC 7.2   < > 8.4 10.5 8.2 6.1 6.8  NEUTROABS 4.0  --   --   --   --  3.4 4.4  HGB 8.8*   < > 8.9* 9.1* 8.6* 8.1* 8.4*  HCT 28.4*   < > 30.2* 31.1* 28.4* 26.0* 26.5*  MCV 82.3   < > 85.1 87.1 84.5 82.8 82.8  PLT 388   < > 372 407* 364 300 328   < > = values in this interval not displayed.   Basic Metabolic Panel: Recent Labs  Lab 02/27/19 0409 02/27/19 0409 02/28/19 0545 03/01/19 0654 03/01/19 1103 03/01/19 1618 03/02/19 0440 03/03/19 0500 03/04/19 0334  NA 140   < > 136   < > 133* 136 136 134* 135  K 3.4*   < > 3.8   < > 4.7 4.1 3.6 3.2* 3.8  CL 106   < > 106   < > 109 114* 111 106 104  CO2 25   < > 22   < > 7* 13* 16* 23 26  GLUCOSE 130*   < > 237*   < > 351* 188* 119* 96 223*  BUN 12   < > 10   < > _0 5*  CREATININE 0.85   < > 0.80   < > 0.93 0.90 0.58 0.46 0.54  CALCIUM 9.0   < > 8.7*   < > 8.6* 8.9 8.4* 8.1* 8.1*  MG 1.5*  --  1.8  --   --   --  1.5* 1.6* 1.6*  PHOS 3.5  --   --   --   --   --  2.8 2.9 2.0*   < > = values in this interval not displayed.   GFR: Estimated Creatinine Clearance: 89.1 mL/min (by C-G formula based on SCr of 0.54 mg/dL). Liver Function Tests: Recent Labs  Lab 02/28/19 0545 03/01/19 0654 03/02/19 0440 03/03/19 0500 03/04/19 0334  AST 12* 10* 8* 15 12*  ALT _1 ALKPHOS 77 81 67 62 65  BILITOT 1.0 1.3* 0.9 0.6 0.6  PROT 6.7 6.8 6.1* 5.4* 5.4*  ALBUMIN 3.4* 3.5 3.0* 2.6* 2.6*   Recent Labs  Lab 02/26/19 0958  LIPASE 15   No results for input(s): AMMONIA in the last 168 hours. Coagulation Profile: No results for input(s): INR,  PROTIME in the last 168 hours. Cardiac Enzymes: No results for input(s): CKTOTAL, CKMB, CKMBINDEX, TROPONINI in the last 168 hours. BNP (last 3 results) No results for input(s): PROBNP in the last 8760 hours. HbA1C: No results for input(s): HGBA1C in the last 72 hours. CBG: Recent Labs  Lab 03/03/19 2034 03/04/19 0043  03/04/19 0410 03/04/19 0732 03/04/19 1133  GLUCAP 215* 104* 189* 182* 66*   Lipid Profile: No results for input(s): CHOL, HDL, LDLCALC, TRIG, CHOLHDL, LDLDIRECT in the last 72 hours. Thyroid Function Tests: No results for input(s): TSH, T4TOTAL, FREET4, T3FREE, THYROIDAB in the last 72 hours. Anemia Panel: No results for input(s): VITAMINB12, FOLATE, FERRITIN, TIBC, IRON, RETICCTPCT in the last 72 hours. Sepsis Labs: Recent Labs  Lab 03/01/19 1401  LATICACIDVEN 0.9    Recent Results (from the past 240 hour(s))  Urine culture     Status: Abnormal   Collection Time: 02/24/19 10:04 AM   Specimen: Urine, Random  Result Value Ref Range Status   Specimen Description   Final    URINE, RANDOM Performed at [facility] [address] [place], [place] [address]    Special Requests   Final    NONE Performed at [facility], [place] [address] [place], [place] [address]    Culture (Felicita Nuncio)  Final    <10,000 COLONIES/mL INSIGNIFICANT GROWTH Performed at [facility] [address], [place], [place] [address]    Report Status 02/26/2019 FINAL  Final  Urine culture     Status: None   Collection Time: 02/26/19  4:26 PM   Specimen: Urine, Random  Result Value Ref Range Status   Specimen Description   Final    URINE, RANDOM Performed at [facility] [address], [place], [place] [address]    Special Requests   Final    NONE Performed at [facility], [place] [address] [place], [place] [address]    Culture   Final    NO GROWTH Performed at [facility], [place] [address]  [place], [place] [address]    Report Status 02/27/2019 FINAL  Final  SARS CORONAVIRUS 2 (TAT 6-24 HRS) Nasopharyngeal Nasopharyngeal Swab     Status: None   Collection Time: 02/26/19  4:32 PM   Specimen: Nasopharyngeal Swab  Result Value Ref Range Status   SARS Coronavirus 2 NEGATIVE NEGATIVE Final    Comment: (NOTE) SARS-CoV-2 target nucleic acids are NOT DETECTED. The SARS-CoV-2 RNA is generally detectable in upper and lower respiratory specimens during the acute phase of infection. Negative results do not preclude SARS-CoV-2 infection, do not rule out co-infections with other pathogens, and should not be used as the sole basis for treatment or other patient management decisions. Negative results must be combined with clinical observations, patient history, and epidemiological information. The expected result is Negative. Fact Sheet for Patients: SugarRoll.be Fact Sheet for Healthcare Providers: https://www.woods-mathews.com/ This test is not yet approved or cleared by the [place] FDA and  has been authorized for detection and/or diagnosis of SARS-CoV-2 by FDA under an Emergency Use Authorization (EUA). This EUA will remain  in effect (meaning this test can be used) for the duration of the COVID-19 declaration under Section 56 4(b)(1) of the Act, 21 U.S.C. section 360bbb-3(b)(1), unless the authorization is terminated or revoked sooner. Performed at [facility], [place] [address], [place], [place] [address]   Culture, Urine     Status: None   Collection Time: 03/01/19 10:25 AM   Specimen: Urine, Clean Catch  Result Value Ref Range Status   Specimen Description   Final    URINE, CLEAN CATCH Performed at [facility], [place] [address], [place], [place] [address]    Special Requests   Final    NONE Performed at [facility]  [place] [address] [place], [place] [address]    Culture   Final    NO  GROWTH Performed at [facility], [place] [address], [place], [place] [address]    Report Status 03/02/2019 FINAL  Final  MRSA PCR Screening     Status: None   Collection Time: 03/01/19  1:14 PM   Specimen: Nasopharyngeal  Result Value Ref Range Status   MRSA by PCR NEGATIVE NEGATIVE Final    Comment:        The GeneXpert MRSA Assay (FDA approved for NASAL specimens only), is one component of Lorann Tani comprehensive MRSA colonization surveillance program. It is not intended to diagnose MRSA infection nor to guide or monitor treatment for MRSA infections. Performed at [facility], [place] [address], [place], [place] [address]          Radiology Studies: No results found.      Scheduled Meds: . Chlorhexidine Gluconate Cloth  6 each Topical Daily  . enoxaparin (LOVENOX) injection  40 mg Subcutaneous Q24H  . ferrous sulfate  325 mg Oral Q breakfast  . insulin aspart  0-9 Units Subcutaneous Q4H  . insulin aspart  4 Units Subcutaneous TID WC  . insulin glargine  8 Units Subcutaneous BID  . mouth rinse  15 mL Mouth Rinse BID  . metoCLOPramide (REGLAN) injection  10 mg Intravenous Q8H  . senna  1 tablet Oral BID  . sodium chloride flush  10-40 mL Intracatheter Q12H  . sodium chloride flush  3 mL Intravenous Q12H   Continuous Infusions: . lactated ringers 75 mL/hr at 03/03/19 2112     LOS: 5 days    Time spent: over 30 min    Fayrene Helper, MD [facility]   To contact the attending provider between 7A-7P or the covering provider during after hours 7P-7A, please log into the web site [email or web address] and access using universal [place] password for that web site. If you do not have the password, please call the hospital operator.  03/04/2019, 12:24 PM

## 2019-03-04 NOTE — Progress Notes (Signed)
Pt voided 417mL this AM without complication. Urine clear and yellow.

## 2019-03-04 NOTE — Plan of Care (Addendum)
Pt a+ox4,VSS SR/ST, abdominal pain relieved with oxycodone and dilaudid prn. Pt had a moderate amount of emesis after drinking apple juice. Nausea relieved with scheduled reglan and prn phenergan. Pt has continued to void post foley removal yesterday. Up with standby assist in the room.POC reviewed with understanding verbalized. Will continue to monitor.  Problem: Clinical Measurements: Goal: Ability to maintain clinical measurements within normal limits will improve Outcome: Progressing Goal: Will remain free from infection Outcome: Progressing Goal: Respiratory complications will improve Outcome: Progressing Goal: Cardiovascular complication will be avoided Outcome: Progressing   Problem: Activity: Goal: Risk for activity intolerance will decrease Outcome: Progressing   Problem: Nutrition: Goal: Adequate nutrition will be maintained Outcome: Progressing   Problem: Coping: Goal: Level of anxiety will decrease Outcome: Progressing   Problem: Elimination: Goal: Will not experience complications related to bowel motility Outcome: Progressing Goal: Will not experience complications related to urinary retention Outcome: Progressing   Problem: Pain Managment: Goal: General experience of comfort will improve Outcome: Progressing   Problem: Safety: Goal: Ability to remain free from injury will improve Outcome: Progressing   Problem: Skin Integrity: Goal: Risk for impaired skin integrity will decrease Outcome: Progressing

## 2019-03-04 NOTE — Progress Notes (Signed)
Pt voided 346ml,  post void residual 425ml,no distention, no discomfort verbalized. MD paged, orders pending.

## 2019-03-05 LAB — COMPREHENSIVE METABOLIC PANEL
ALT: 8 U/L (ref 0–44)
AST: 9 U/L — ABNORMAL LOW (ref 15–41)
Albumin: 2.7 g/dL — ABNORMAL LOW (ref 3.5–5.0)
Alkaline Phosphatase: 70 U/L (ref 38–126)
Anion gap: 6 (ref 5–15)
BUN: 9 mg/dL (ref 6–20)
CO2: 29 mmol/L (ref 22–32)
Calcium: 8.6 mg/dL — ABNORMAL LOW (ref 8.9–10.3)
Chloride: 104 mmol/L (ref 98–111)
Creatinine, Ser: 0.51 mg/dL (ref 0.44–1.00)
GFR calc Af Amer: >60 mL/min (ref >60)
GFR calc non Af Amer: >60 mL/min (ref >60)
Glucose, Bld: 160 mg/dL — ABNORMAL HIGH (ref 70–99)
Potassium: 3.8 mmol/L (ref 3.5–5.1)
Sodium: 139 mmol/L (ref 135–145)
Total Bilirubin: 0.5 mg/dL (ref 0.3–1.2)
Total Protein: 5.5 g/dL — ABNORMAL LOW (ref 6.5–8.1)

## 2019-03-05 LAB — GLUCOSE, CAPILLARY
Glucose-Capillary: 115 mg/dL — ABNORMAL HIGH (ref 70–99)
Glucose-Capillary: 160 mg/dL — ABNORMAL HIGH (ref 70–99)
Glucose-Capillary: 172 mg/dL — ABNORMAL HIGH (ref 70–99)
Glucose-Capillary: 256 mg/dL — ABNORMAL HIGH (ref 70–99)
Glucose-Capillary: 275 mg/dL — ABNORMAL HIGH (ref 70–99)
Glucose-Capillary: 293 mg/dL — ABNORMAL HIGH (ref 70–99)

## 2019-03-05 LAB — CBC WITH DIFFERENTIAL/PLATELET
Abs Immature Granulocytes: 0.02 10*3/uL (ref 0.00–0.07)
Basophils Absolute: 0 10*3/uL (ref 0.0–0.1)
Basophils Relative: 0 %
Eosinophils Absolute: 0.2 10*3/uL (ref 0.0–0.5)
Eosinophils Relative: 3 %
HCT: 27.6 % — ABNORMAL LOW (ref 36.0–46.0)
Hemoglobin: 8.5 g/dL — ABNORMAL LOW (ref 12.0–15.0)
Immature Granulocytes: 0 %
Lymphocytes Relative: 42 %
Lymphs Abs: 2.2 10*3/uL (ref 0.7–4.0)
MCH: 25.8 pg — ABNORMAL LOW (ref 26.0–34.0)
MCHC: 30.8 g/dL (ref 30.0–36.0)
MCV: 83.6 fL (ref 80.0–100.0)
Monocytes Absolute: 0.8 10*3/uL (ref 0.1–1.0)
Monocytes Relative: 15 %
Neutro Abs: 2.1 10*3/uL (ref 1.7–7.7)
Neutrophils Relative %: 40 %
Platelets: 357 10*3/uL (ref 150–400)
RBC: 3.3 MIL/uL — ABNORMAL LOW (ref 3.87–5.11)
RDW: 17.1 % — ABNORMAL HIGH (ref 11.5–15.5)
WBC: 5.3 10*3/uL (ref 4.0–10.5)
nRBC: 0 % (ref 0.0–0.2)

## 2019-03-05 LAB — PHOSPHORUS: Phosphorus: 3.2 mg/dL (ref 2.5–4.6)

## 2019-03-05 LAB — MAGNESIUM: Magnesium: 1.9 mg/dL (ref 1.7–2.4)

## 2019-03-05 MED ORDER — METOCLOPRAMIDE HCL 10 MG PO TABS
10.0000 mg | ORAL_TABLET | Freq: Three times a day (TID) | ORAL | Status: DC
  Administered 2019-03-05 – 2019-03-06 (×4): 10 mg via ORAL
  Filled 2019-03-05 (×4): qty 1

## 2019-03-05 MED ORDER — POLYETHYLENE GLYCOL 3350 17 G PO PACK
17.0000 g | PACK | Freq: Two times a day (BID) | ORAL | Status: DC
  Administered 2019-03-05: 17 g via ORAL
  Filled 2019-03-05 (×3): qty 1

## 2019-03-05 MED ORDER — OXYCODONE HCL 5 MG PO TABS
5.0000 mg | ORAL_TABLET | Freq: Three times a day (TID) | ORAL | Status: DC | PRN
  Administered 2019-03-05 (×2): 5 mg via ORAL
  Filled 2019-03-05 (×2): qty 1

## 2019-03-05 NOTE — Plan of Care (Signed)
Patient tolerating carb mod diet, medicated for pain and nausea x 1 on 7 a to 7 p shift, no vomiting reported.  Patient up multiple times to urinate, voiding approximately 400-500 mls each time.  Post void bladder scan at 1200 241, scan at 1800 232.

## 2019-03-05 NOTE — Progress Notes (Signed)
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  Assessment & Pln:   Active Problems:   Gstropresis   Intrctble cyclicl vomiting with nuse  1. Hyperglycemi with concern for DKA  Anion Gp Metbolic Acidosis  Non Gp Metbolic Acidosis 1. resolved 2. Follow lctte (wnl) 3. Continue insulin s noted below  2. Poorly controlled type 1 dibetes  Hypoglycemi . HA1C 7.8 b. Lntus, meltime insulin nd q4 SSI - djust s needed c. Dibetic coordintor consult  3. Intrctble cyclicl nuse nd vomiting with history of dibetic gstropresis . Gstric emptying study6/21/2018 with significntly delyed gstric emptying i. 2% emptied t 1 hr ( norml >= 10%) ii. 16% emptied t 2 hr ( norml >= 40%) iii. 20% emptied t 3 hr ( norml >= 70%) iv. 24% emptied t 4 hr ( norml >= 90%) b. UDS negtive for THC or other substnces c. I wonder if her recurrent symptoms re from gstropresis excerbted by chronic mediction use nd/or withdrwl from meds once she goes home (not getting IV) leding to frequent hospitliztions d. D/c diludid.  Trnsition to PO phenergn.  Trnsition to PO regln tody.  If does well, mybe ble to consider d/c 2/15. e. Hs crb mod diet ordered - will follow if she's ble to tolerte this  4. 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 Possibly excerbted from nticholinergic effect of Phenergn b. Recommend tper s bove c.  Retining >999 on bldder scn -> foley in plce -> will tril without foley 2/12 - follow bldder scns d. Follow UA (see below) nd culture -> no growth  5. Iron Def Anemi 1. IV iron tody 2. No si/sx bleeding  6. Hypoglycemi due to decresed p.o. intke nd insulin , s bove  7. Hypoklemi  hypomgnesemi: 1. Replce nd follow  8. Abnorml UA ?interstitil cystitis  Hemturi  Proteinuri  Pyuri . Consider mitriptyline if symptomtic b. Needs outptient f/u for hemturi  She's been in nd out of hospitl frequently over pst severl weeks to months, usully not home more thn  dy or 2 between ED visits or hospitliztions.  DVT prophylxis: lovenox Code Sttus: full Fmily Communiction: none t bedside - mom, [name] 2/11 Disposition Pln:  . Ptient cme from: home           . Anticipted d/c plce:  . Brriers to d/c OR conditions which need to be met to effect  sfe d/c:  Consultnts:   none  Procedures:  none  Antimicrobils:  Anti-infectives (From dmission, onwrd)   None     Subjective: No emesis yesterdy No new complints  Objective: Vitls:   03/04/19 0413 03/04/19 1409 03/04/19 2008 03/05/19 0439  BP: 99/69 112/77 124/88 114/83  Pulse: 93 90 90 90  Resp: 20 12 16 16   Temp: (!) 100.6 F (38.1 C) 98.6 F (37 C) 98.8 F (37.1 C) 98.9 F (37.2 C)  TempSrc: Orl Orl Orl Orl  SpO2: 98% 99% 100% 100%  Weight:      Height:        Intke/Output Summry (Lst 24 **Note De-Identified vi Obfusction** hours) t 03/05/2019 1040 Lst dt filed t 03/05/2019 1000 Gross per 24 hour  Intke 2463.6 ml  Output 2500 ml  Net -36.4 ml   Filed Weights   02/26/19 0913  Weight: 54.4 kg    Exmintion:  Generl: No cute distress. Crdiovsculr: Hert sounds show  regulr rte, nd rhythm Lungs: Cler to usculttion bilterlly  Abdomen: Soft, nontender, nondistended Neurologicl: Alert nd oriented 3. Moves ll extremities 4. Crnil nerves II through XII grossly  intct. Skin: Wrm nd dry. No rshes or lesions. Extremities: No clubbing or cynosis. No edem.   Dt Reviewed: I hve personlly reviewed following lbs nd imging studies  CBC: Recent Lbs  Lb 03/01/19 0654 03/02/19 0440 03/03/19 0500 03/04/19 0334 03/05/19 0423  WBC 10.5 8.2 6.1 6.8 5.3  NEUTROABS  --   --  3.4 4.4 2.1  HGB 9.1* 8.6* 8.1* 8.4* 8.5*  HCT 31.1* 28.4* 26.0* 26.5* 27.6*  MCV 87.1 84.5 82.8 82.8 83.6  PLT 407* 364 300 328 686   Bsic Metbolic Pnel: Recent Lbs  Lb 02/27/19 0409 02/27/19 0409 02/28/19 0545 03/01/19 0654 03/01/19 1618 03/02/19 0440 03/03/19 0500 03/04/19 0334 03/05/19 0423  NA 140   < > 136   < > 136 136 134* 135 139  K 3.4*   < > 3.8   < > 4.1 3.6 3.2* 3.8 3.8  CL 106   < > 106   < > 114* 111 106 104 104  CO2 25   < > 22   < > 13* 16* 23 26 29   GLUCOSE 130*   < > 237*   < > 188* 119* 96 223* 160*  BUN 12   < > 10   < > 12 9 7  5* 9  CREATININE 0.85   < > 0.80   < > 0.90 0.58 0.46 0.54 0.51  CALCIUM 9.0   < > 8.7*   < > 8.9 8.4* 8.1* 8.1* 8.6*  MG 1.5*   < > 1.8  --   --  1.5* 1.6* 1.6* 1.9  PHOS 3.5  --   --   --   --  2.8 2.9 2.0* 3.2   < > = vlues in this intervl not displyed.   GFR: Estimted Cretinine Clernce: 89.1 mL/min (by [place] formul bsed on SCr of 0.51 mg/dL). Liver Function Tests: Recent Lbs  Lb 03/01/19 0654 03/02/19 0440 03/03/19 0500 03/04/19 0334 03/05/19 0423  AST 10* 8* 15 12* 9*  ALT 11 9 11 10 8   ALKPHOS 81 67 62 65 70  BILITOT 1.3* 0.9 0.6 0.6 0.5  PROT 6.8 6.1* 5.4* 5.4* 5.5*  ALBUMIN 3.5 3.0* 2.6* 2.6* 2.7*   No results for input(s): LIPASE, AMYLASE in the lst 168 hours. No results for input(s): AMMONIA in the lst 168 hours. Cogultion Profile: No results for input(s): INR, PROTIME in the lst 168 hours. Crdic Enzymes: No results for input(s): CKTOTAL, CKMB, CKMBINDEX, TROPONINI in the lst 168 hours. BNP (lst 3 results) No results for input(s): PROBNP in the lst 8760 hours.  HbA1C: No results for input(s): HGBA1C in the lst 72 hours. CBG: Recent Lbs  Lb 03/04/19 1605 03/04/19 2004 03/05/19 0019 03/05/19 0417 03/05/19 0753  GLUCAP 100* 206* 256* 160* 115*   Lipid Profile: No results for input(s): CHOL, HDL, LDLCALC, TRIG, CHOLHDL, LDLDIRECT in the lst 72 hours. Thyroid Function Tests: No results for input(s): TSH, T4TOTAL, FREET4, T3FREE, THYROIDAB in the lst 72 hours. Anemi Pnel: No results for **Note De-Identified via Obfuation** input(s): VITMINB12, FOLTE, FERRITIN, TIBC, IRON, RETICCTPCT in the last 72 hours. Sepsis Labs: Recent Labs  Lab 03/01/19 1401  LTICCIDVEN 0.9    Recent Results (from the past 240 hour(s))  Urine culture     Status: bnormal   Collection Time: 02/24/19 10:04 M   Specimen: Urine, Random  Result Value Ref Range Status   Specimen Deription   Final    URINE, RNDOM Performed at [place] [address], [place], [place] [address]    Special Requests   Final    NONE Performed at [facility], [place] [address], [place], [place] [address]    Culture ()  Final    <10,000 COLONIES/mL INSIGNIFICNT GROWTH Performed at [place] [address] [place], [place] [address]    Report Status 02/26/2019 FINL  Final  Urine culture     Status: None   Collection Time: 02/26/19  4:26 PM   Specimen: Urine, Random  Result Value Ref Range Status   Specimen Deription   Final    URINE, RNDOM Performed at [place] [address] [place], [place] [address]    Special Requests   Final    NONE Performed at [facility], [place] [address] [place], [place] [address]    Culture   Final    NO GROWTH Performed at [facility], [place] [address], [place], [place] [address]    Report Status 02/27/2019 FINL  Final  SRS CORONVIRUS 2 (TT 6-24 HRS) Nasopharyngeal Nasopharyngeal Swab     Status: None   Collection Time: 02/26/19  4:32 PM   Specimen:  Nasopharyngeal Swab  Result Value Ref Range Status   SRS Coronavirus 2 NEGTIVE NEGTIVE Final    Comment: (NOTE) SRS-CoV-2 target nucleic acids are NOT DETECTED. The SRS-CoV-2 RN is generally detectable in upper and lower respiratory specimens during the acute phase of infection. Negative results do not preclude SRS-CoV-2 infection, do not rule out co-infections with other pathogens, and should not be used as the sole basis for treatment or other patient management decisions. Negative results must be combined with clinical observations, patient history, and epidemiological information. The expected result is Negative. Fact Sheet for Patients: SugarRoll.be Fact Sheet for Healthcare Providers: https://www.woods-mathews.com/ This test is not yet approved or cleared by the [place] [facility] and  has been authorized for detection and/or diagnosis of SRS-CoV-2 by FD under an Emergency Use uthorization (EU). This EU will remain  in effect (meaning this test can be used) for the duration of the COVID-19 declaration under Section 56 4(b)(1) of the ct, 21 U.S.C. section 360bbb-3(b)(1), unless the authorization is terminated or revoked sooner. Performed at [facility], [place] [address], [place], [place] [address]   Culture, Urine     Status: None   Collection Time: 03/01/19 10:25 M   Specimen: Urine, Clean Catch  Result Value Ref Range Status   Specimen Deription   Final    URINE, CLEN CTCH Performed at [facility], [place] [address] [place], [place] [address]    Special Requests   Final    NONE Performed at [facility], [place] [address] [place], [place] [address]    Culture   Final    NO GROWTH Performed at [facility], [place] [address], [place],  [address]    Report Status 03/02/2019 FINL  Final  MRS PCR Screening     Status: None **Note De-Identified vi Obfusction** Collection Time: 03/01/19  1:14 PM    Specimen: Nsophryngel  Result Vlue Ref Rnge Sttus   MRSA by PCR NEGATIVE NEGATIVE Finl    Comment:        The GeneXpert MRSA Assy (FDA pproved for NASAL specimens only), is one component of  comprehensive MRSA coloniztion surveillnce progrm. It is not intended to dignose MRSA infection nor to guide or monitor tretment for MRSA infections. Performed t [facility], [place] [address], [place], [place] [address]          Rdiology Studies: No results found.      Scheduled Meds: . Chlorhexidine Gluconte Cloth  6 ech Topicl Dily  . enoxprin (LOVENOX) injection  40 mg Subcutneous Q24H  . ferrous sulfte  325 mg Orl Q brekfst  . insulin sprt  0-9 Units Subcutneous Q4H  . insulin sprt  2 Units Subcutneous TID WC  . insulin glrgine  8 Units Subcutneous BID  . mouth rinse  15 mL Mouth Rinse BID  . metoCLOPrmide  10 mg Orl TID AC & HS  . polyethylene glycol  17 g Orl BID  . senn  1 tblet Orl BID  . sodium chloride flush  10-40 mL Intrctheter Q12H  . sodium chloride flush  3 mL Intrvenous Q12H   Continuous Infusions: . lctted ringers 75 mL/hr t 03/05/19 0500     LOS: 6 dys    Time spent: over 30 min    Fyrene Helper, MD [facility]   To contct the ttending provider between 7A-7P or the covering provider during fter hours 7P-7A, plese log into the web site www.mion.com nd ccess using [facility] pssword for tht web site. If you do not hve the pssword, plese cll the hospitl opertor.  03/05/2019, 10:40 AM

## 2019-03-06 LAB — CBC WITH DIFFERENTIAL/PLATELET
Abs Immature Granulocytes: 0.07 10*3/uL (ref 0.00–0.07)
Basophils Absolute: 0 10*3/uL (ref 0.0–0.1)
Basophils Relative: 0 %
Eosinophils Absolute: 0.2 10*3/uL (ref 0.0–0.5)
Eosinophils Relative: 4 %
HCT: 27.1 % — ABNORMAL LOW (ref 36.0–46.0)
Hemoglobin: 8.4 g/dL — ABNORMAL LOW (ref 12.0–15.0)
Immature Granulocytes: 1 %
Lymphocytes Relative: 45 %
Lymphs Abs: 2.4 10*3/uL (ref 0.7–4.0)
MCH: 25.8 pg — ABNORMAL LOW (ref 26.0–34.0)
MCHC: 31 g/dL (ref 30.0–36.0)
MCV: 83.4 fL (ref 80.0–100.0)
Monocytes Absolute: 0.8 10*3/uL (ref 0.1–1.0)
Monocytes Relative: 14 %
Neutro Abs: 2 10*3/uL (ref 1.7–7.7)
Neutrophils Relative %: 36 %
Platelets: 380 10*3/uL (ref 150–400)
RBC: 3.25 MIL/uL — ABNORMAL LOW (ref 3.87–5.11)
RDW: 17 % — ABNORMAL HIGH (ref 11.5–15.5)
WBC: 5.4 10*3/uL (ref 4.0–10.5)
nRBC: 0 % (ref 0.0–0.2)

## 2019-03-06 LAB — COMPREHENSIVE METABOLIC PANEL
ALT: 9 U/L (ref 0–44)
AST: 9 U/L — ABNORMAL LOW (ref 15–41)
Albumin: 2.6 g/dL — ABNORMAL LOW (ref 3.5–5.0)
Alkaline Phosphatase: 67 U/L (ref 38–126)
Anion gap: 6 (ref 5–15)
BUN: 7 mg/dL (ref 6–20)
CO2: 30 mmol/L (ref 22–32)
Calcium: 8.7 mg/dL — ABNORMAL LOW (ref 8.9–10.3)
Chloride: 103 mmol/L (ref 98–111)
Creatinine, Ser: 0.43 mg/dL — ABNORMAL LOW (ref 0.44–1.00)
GFR calc Af Amer: >60 mL/min (ref >60)
GFR calc non Af Amer: >60 mL/min (ref >60)
Glucose, Bld: 65 mg/dL — ABNORMAL LOW (ref 70–99)
Potassium: 3.5 mmol/L (ref 3.5–5.1)
Sodium: 139 mmol/L (ref 135–145)
Total Bilirubin: 0.4 mg/dL (ref 0.3–1.2)
Total Protein: 5.7 g/dL — ABNORMAL LOW (ref 6.5–8.1)

## 2019-03-06 LAB — GLUCOSE, CAPILLARY
Glucose-Capillary: 212 mg/dL — ABNORMAL HIGH (ref 70–99)
Glucose-Capillary: 240 mg/dL — ABNORMAL HIGH (ref 70–99)
Glucose-Capillary: 307 mg/dL — ABNORMAL HIGH (ref 70–99)
Glucose-Capillary: 48 mg/dL — ABNORMAL LOW (ref 70–99)
Glucose-Capillary: 65 mg/dL — ABNORMAL LOW (ref 70–99)

## 2019-03-06 LAB — PHOSPHORUS: Phosphorus: 3.4 mg/dL (ref 2.5–4.6)

## 2019-03-06 LAB — MAGNESIUM: Magnesium: 1.7 mg/dL (ref 1.7–2.4)

## 2019-03-06 MED ORDER — HEPARIN SOD (PORK) LOCK FLUSH 100 UNIT/ML IV SOLN
500.0000 [IU] | INTRAVENOUS | Status: AC | PRN
  Administered 2019-03-06: 500 [IU]

## 2019-03-06 MED ORDER — INSULIN ASPART 100 UNIT/ML ~~LOC~~ SOLN: 0.0000 [IU] | Freq: Every day | SUBCUTANEOUS | Status: DC

## 2019-03-06 MED ORDER — INSULIN ASPART 100 UNIT/ML ~~LOC~~ SOLN: 4.0000 [IU] | Freq: Three times a day (TID) | SUBCUTANEOUS | Status: DC

## 2019-03-06 MED ORDER — METOCLOPRAMIDE HCL 10 MG PO TABS: 10.0000 mg | ORAL_TABLET | Freq: Three times a day (TID) | ORAL | 0 refills | Status: DC

## 2019-03-06 MED ORDER — PANTOPRAZOLE SODIUM 40 MG PO TBEC: 40.0000 mg | DELAYED_RELEASE_TABLET | Freq: Every day | ORAL | 0 refills | Status: DC

## 2019-03-06 MED ORDER — INSULIN ASPART 100 UNIT/ML ~~LOC~~ SOLN
0.0000 [IU] | Freq: Three times a day (TID) | SUBCUTANEOUS | Status: DC
  Administered 2019-03-06: 3 [IU] via SUBCUTANEOUS

## 2019-03-06 MED ORDER — POLYETHYLENE GLYCOL 3350 17 G PO PACK: 17.0000 g | PACK | Freq: Every day | ORAL | 0 refills | Status: DC

## 2019-03-06 MED ORDER — PROMETHAZINE HCL 25 MG PO TABS: 25.0000 mg | ORAL_TABLET | Freq: Four times a day (QID) | ORAL | 0 refills | Status: DC | PRN

## 2019-03-06 NOTE — Progress Notes (Signed)
Inpatient Diabetes Program Recommendations  AACE/ADA: New Consensus Statement on Inpatient Glycemic Control (2015)  Target Ranges:  Prepandial:   less than 140 mg/dL      Peak postprandial:   less than 180 mg/dL (1-2 hours)      Critically ill patients:  140 - 180 mg/dL   Lab Results  Component Value Date   GLUCAP 240 (H) 03/06/2019   HGBA1C 7.8 (H) 02/18/2019    Review of Glycemic Control  Diabetes history: DM1 Outpatient Diabetes medications: insulin pump Current orders for Inpatient glycemic control: Lantus 8 units bid Novolog 0-9 units tid + hs Novolog 2 units tid meal coverage  Inpatient Diabetes Program Recommendations:     Noted hypoglycemia this am in the 40's. May be in part to Novolog doses overnight. Correction now changed to tid + hs.  Consider increasing meal coverage insulin to Novolog 4 units tid if eating 50% of meals.  Thanks,  Tama Headings RN, MSN, BC-ADM Inpatient Diabetes Coordinator Team Pager [phone] (8a-5p)

## 2019-03-06 NOTE — IPOC Note (Signed)
Pt has a CBG of 48 - supplemented with 240 of OJ orally and Chicken Broth 114ml- lab draw and follow up CBG both are reading at 65. Lab was drawn prior to first CBG- patient is stable and well. Asymptomatic at this time, another 156ml  of OJ  was given as a supplement.

## 2019-03-06 NOTE — Discharge Summary (Signed)
Physician Discharge Summary  [name] NID:[id number] DOB: 1990-04-26 DOA: 02/26/2019  PCP: Vicenta Aly, FNP  Admit date: 02/26/2019 Discharge date: 03/06/2019  Time spent: 40 minutes  Recommendations for Outpatient Follow-up:  1. Follow outpatient CBC/CMP 2. Follow with endocrine/PCP for adjustments to BG regimen 3. Discharged with reglan -> continue to follow long term need, consider drug holiday/dose reductions as tolerated 4. Follow hematuria/proteinuria outpatient -> likely related to diabetes and intermittent catheterization  Discharge Diagnoses:  Active Problems:   Gastroparesis   Intractable cyclical vomiting with nausea   Discharge Condition: stable  Diet recommendation: diabetic  Filed Weights   02/26/19 0913  Weight: 54.4 kg    History of present illness:  This is [name] 29 year old female with history of type 1 diabetes on insulin pump, recurrent DKA, multiple hospitalizations for complications of gastroparesis, intractable cyclical vomiting, hypertension, GERD, asthma with recent hospitalization from 1/30-2/4 for similar complaints and has received care from both [facility], [facility] and was admitted on 2/7 for intractable nausea and vomiting.  She was admitted with recurrent nausea, vomiting 2/2 diabetic gastroparesis.  She's improved with antiemetics, scheduled reglan.  She had improved, tolerating PO intake x24 hrs prior to discharge.  She was discharged on 2/15 with instructions to f/u with PCP outpatient as well as endocrine.  Hospital Course:  1. Hyperglycemia with concern for DKA  Anion Gap Metabolic Acidosis  Non Gap Metabolic Acidosis 1. resolved 2. Follow lactate (wnl) 3. Continue insulin as noted below  2. Poorly controlled type 1 diabetes  Hypoglycemia [name]. HA1C 7.8 b. Lantus, mealtime insulin and q4 SSI - adjust as needed c. Resume insulin pump at home -> she notes her blood sugars are typically well controlled with  this d. Diabetic coordinator consult e. Hypoglycemia this AM, continue to monitor at hoem  3. Intractable cyclical nausea and vomiting with history of diabetic gastroparesis d. Gastric emptying study6/21/2018 with significantly delayed gastric emptying i. 2% emptied at 1 hr ( normal >= 10%) ii. 16% emptied at 2 hr ( normal >= 40%) iii. 20% emptied at 3 hr ( normal >= 70%) iv. 24% emptied at 4 hr ( normal >= 90%) e. UDS negative for THC or other substances f. I wonder if her recurrent symptoms are fromgastroparesis exacerbated bychronic medication useand/or withdrawal from meds once she goes home (not getting IV) leading tofrequent hospitalizations g. D/c dilaudid.  Transition to PO phenergan.  Continue PO reglan at discharge. h. Tolerated breakfast, lunch, dinner on 2/14.   4. 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 Iron Def Anemia 1. IV iron today 2. No si/sx bleeding  6. Hypoglycemia due to decreased p.o. intake and insulin, as above  7. Hypokalemia  hypomagnesemia: 1. Replace and follow  8. Abnormal UA ?interstitial cystitis  Hematuria  Proteinuria  Pyuria [name]. Consider amitriptyline if symptomatic b. Suspect this is related to intermittent catheterization c. Follow hematuria and proteinuria outpatient  She's been in and out of hospital frequently over past several weeks to months, usually not home more than [name] day or 2 between ED visits or hospitalizations.  Diabetic coordinator recommended considering psych c/s, discussed with pt, mood is good.  No indication for inpatient psych c/s at this time.  Discussed consider outpatient psych f/u (she doesn't seem interested in this).  Procedures:  none  Consultations:  none  Discharge Exam: Vitals:   03/05/19 2013 03/06/19 0452  BP: (!) 137/97 126/84  Pulse: 92  92  Resp: 18 18  Temp: 99.1 F (37.3 C) 99.1 F (37.3 C)  SpO2: (!) 71% 100%    Feels good, ready for discharge Discussed d/c plan with mother  General: No acute distress. Cardiovascular: Heart sounds show [name] regular rate, and rhythm Lungs: Clear to auscultation bilaterally  Abdomen: Soft, nontender, nondistended  Neurological: Alert and oriented 3. Moves all extremities 4. Cranial nerves II through XII grossly intact. Skin: Warm and dry. No rashes or lesions. Extremities: No clubbing or cyanosis. No edema.   Discharge Instructions   Discharge Instructions    Call MD for:  difficulty breathing, headache or visual disturbances   Complete by: As directed    Call MD for:  extreme fatigue   Complete by: As directed    Call MD for:  hives   Complete by: As directed    Call MD for:  persistant dizziness or light-headedness   Complete by: As directed    Call MD for:  persistant nausea and vomiting   Complete by: As directed    Call MD for:  redness, tenderness, or signs of infection (pain, swelling, redness, odor or green/yellow discharge around incision site)   Complete by: As directed    Call MD for:  severe uncontrolled pain   Complete by: As directed    Call MD for:  temperature >100.4   Complete by: As directed    Diet - low sodium heart healthy   Complete by: As directed    Discharge instructions   Complete by: As directed    You were seen for gastroparesis.  You've improved with reglan and antiemetics.  We'll prescribe reglan and phenergan at discharge for you.  Please follow up with your outpatient PCP and endocrinologist.   Resume your home insulin regimen.  Follow your blood sugars and let your PCP know how they're trending.  Return for new, recurrent, or worsening symptoms.  Please ask your PCP to request records from this hospitalization so they know what was done and what the next steps will be.   Increase activity slowly   Complete by: As directed      Allergies as of 03/06/2019      Reactions   Peanut-containing Drug Products  Swelling, Other (See Comments)   Reaction:  Swelling of mouth and lips    Food Swelling, Other (See Comments)   Pt is allergic to strawberries.   Reaction:  Swelling of mouth and lips    Ultram [tramadol] Itching      Medication List    TAKE these medications   albuterol 108 (90 Base) MCG/ACT inhaler Commonly known as: VENTOLIN HFA Inhale 1-2 puffs into the lungs every 6 (six) hours as needed for wheezing or shortness of breath.   blood glucose meter kit and supplies Dispense based on patient and insurance preference. Use up to four times daily as directed. (FOR ICD-10 E10.9, E11.9).   ferrous sulfate 324 (65 Fe) MG Tbec Take 1 tablet by mouth every other day.   glucagon 1 MG injection Inject 1 mg into the muscle once as needed (For very low blood sugars. May repeat in 15-20 minutes if needed.).   Insulin Pen Needle 31G X 5 MM Misc 30 Units by Does not apply route at bedtime.   insulin pump Soln Inject 0-10 each into the skin continuous. Per sliding scale - Novolog 100 units/ml   lisinopril 2.5 MG tablet Commonly known as: ZESTRIL Take 2.5 mg by mouth daily.   metoCLOPramide 10  MG tablet Commonly known as: REGLAN Take 1 tablet (10 mg total) by mouth 3 (three) times daily before meals. What changed: when to take this   pantoprazole 40 MG tablet Commonly known as: PROTONIX Take 1 tablet (40 mg total) by mouth daily.   polyethylene glycol 17 g packet Commonly known as: MIRALAX / GLYCOLAX Take 17 g by mouth daily for 14 days.   promethazine 25 MG suppository Commonly known as: PHENERGAN Place 1 suppository (25 mg total) rectally every 6 (six) hours as needed (If unable to take oral phenergan).   promethazine 25 MG tablet Commonly known as: PHENERGAN Take 1 tablet (25 mg total) by mouth every 6 (six) hours as needed for nausea or vomiting.   traZODone 50 MG tablet Commonly known as: DESYREL Take 50 mg by mouth at bedtime.      Allergies  Allergen Reactions  .  Peanut-Containing Drug Products Swelling and Other (See Comments)    Reaction:  Swelling of mouth and lips   . 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   Reaction:  Swelling of mouth and lips   . Ultram [Tramadol] Itching      The results of significant diagnostics from this hospitalization (including imaging, microbiology, ancillary and laboratory) are listed below for reference.    Significant Diagnostic Studies: CT ABDOMEN PELVIS W CONTRAST  Result Date: 02/18/2019 CLINICAL DATA:  Severe diffuse abdominal pain, nausea, and vomiting for 3 days. EXAM: CT ABDOMEN AND PELVIS WITH CONTRAST TECHNIQUE: Multidetector CT imaging of the abdomen and pelvis was performed using the standard protocol following bolus administration of intravenous contrast. CONTRAST:  156m OMNIPAQUE IOHEXOL 300 MG/ML  SOLN COMPARISON:  12/28/2018 FINDINGS: Lower Chest: No acute findings. Hepatobiliary: No hepatic masses identified. Prior cholecystectomy. No evidence of biliary obstruction. Pancreas:  No mass or inflammatory changes. Spleen: Within normal limits in size and appearance. Adrenals/Urinary Tract: No masses identified. No evidence of hydronephrosis. Areas of decreased enhancement in the right kidney decreased since previous study, consistent with resolving pyelonephritis. Previously seen right hydroureteronephrosis has resolved. Diffuse bladder wall thickening and mucosal enhancement show mild improvement, consistent with improving cystitis. Stomach/Bowel: Persistent wall thickening distal thoracic esophagus, consistent with esophagitis. No evidence of hiatal hernia. No evidence of bowel obstruction or wall thickening, or abnormal fluid collections. Vascular/Lymphatic: No pathologically enlarged lymph nodes. No abdominal aortic aneurysm. Reproductive: 1.7 cm posterior uterine fibroid noted. Adnexal regions are unremarkable. Other:  None. Musculoskeletal:  No suspicious bone lesions  identified. IMPRESSION: 1. No acute findings. 2. Improving right pyelonephritis and resolution of right hydroureteronephrosis since previous study. 3. Interval decrease in diffuse bladder wall thickening, consistent with improving cystitis. 4. Stable wall thickening of distal thoracic esophagus, consistent with esophagitis. 5. 1.7 cm posterior uterine fibroid. Electronically Signed   By: JMarlaine HindM.D.   On: 02/18/2019 14:17   DG Abd Acute W/Chest  Result Date: 02/16/2019 CLINICAL DATA:  Abdominal pain. EXAM: DG ABDOMEN ACUTE W/ 1V CHEST COMPARISON:  CT 12/28/2018.  Abdomen series 12/21/2018. FINDINGS: PowerPort catheter noted with tip over superior vena cava. No acute cardiopulmonary disease identified. Surgical clips right upper quadrant. Stool noted throughout the colon. No bowel distention or free air. Pelvic calcifications consistent phleboliths again noted. No acute bony abnormality identified. IMPRESSION: 1. PowerPort catheter with tip over superior vena cava. No acute cardiopulmonary disease. 2. No acute intra-abdominal abnormality identified. Stool noted throughout the colon. Electronically Signed   By: TMarcello Moores Register   On:  02/16/2019 14:40    Microbiology: Recent Results (from the past 240 hour(s))  Urine culture     Status: None   Collection Time: 02/26/19  4:26 PM   Specimen: Urine, Random  Result Value Ref Range Status   Specimen Description   Final    URINE, RANDOM Performed at [facility] [address] [place], [place] [address]    Special Requests   Final    NONE Performed at [facility], [place] [address], [place], [place] [address]    Culture   Final    NO GROWTH Performed at [facility], [place] [address] [place], [place] [address]    Report Status 02/27/2019 FINAL  Final  SARS CORONAVIRUS 2 (TAT 6-24 HRS) Nasopharyngeal Nasopharyngeal Swab     Status: None   Collection Time: 02/26/19  4:32 PM   Specimen: Nasopharyngeal  Swab  Result Value Ref Range Status   SARS Coronavirus 2 NEGATIVE NEGATIVE Final    Comment: (NOTE) SARS-CoV-2 target nucleic acids are NOT DETECTED. The SARS-CoV-2 RNA is generally detectable in upper and lower respiratory specimens during the acute phase of infection. Negative results do not preclude SARS-CoV-2 infection, do not rule out co-infections with other pathogens, and should not be used as the sole basis for treatment or other patient management decisions. Negative results must be combined with clinical observations, patient history, and epidemiological information. The expected result is Negative. Fact Sheet for Patients: SugarRoll.be Fact Sheet for Healthcare Providers: https://www.woods-mathews.com/ This test is not yet approved or cleared by the [place] FDA and  has been authorized for detection and/or diagnosis of SARS-CoV-2 by FDA under an Emergency Use Authorization (EUA). This EUA will remain  in effect (meaning this test can be used) for the duration of the COVID-19 declaration under Section 56 4(b)(1) of the Act, 21 U.S.C. section 360bbb-3(b)(1), unless the authorization is terminated or revoked sooner. Performed at [facility], [place] [address] [place], [place] [address]   Culture, Urine     Status: None   Collection Time: 03/01/19 10:25 AM   Specimen: Urine, Clean Catch  Result Value Ref Range Status   Specimen Description   Final    URINE, CLEAN CATCH Performed at [facility], [place] [address] [place], [place] [address]    Special Requests   Final    NONE Performed at [facility], [place] [address], [place], [place] [address]    Culture   Final    NO GROWTH Performed at [facility], [place] [address] [place], [place] [address]    Report Status 03/02/2019 FINAL  Final  MRSA PCR Screening     Status: None   Collection Time: 03/01/19  1:14 PM   Specimen:  Nasopharyngeal  Result Value Ref Range Status   MRSA by PCR NEGATIVE NEGATIVE Final    Comment:        The GeneXpert MRSA Assay (FDA approved for NASAL specimens only), is one component of [name] comprehensive MRSA colonization surveillance program. It is not intended to diagnose MRSA infection nor to guide or monitor treatment for MRSA infections. Performed at [facility], [place] [address], [place], [place] [address]      Labs: Basic Metabolic Panel: Recent Labs  Lab 03/02/19 0440 03/03/19 0500 03/04/19 0334 03/05/19 0423 03/06/19 0323  NA 136 134* 135 139 139  K 3.6 3.2* 3.8 3.8 3.5  CL 111 106 104 104 103  CO2 16* 23 26 29 30   GLUCOSE 119* 96 223* 160* 65*  BUN 9 7 5* 9 7  CREATININE 0.58 0.46 0.54 0.51 0.43*  CALCIUM 8.4* 8.1* 8.1* 8.6* 8.7*  MG 1.5* 1.6* 1.6* 1.9 1.7  PHOS 2.8 2.9 2.0* 3.2 3.4   Liver Function Tests: Recent Labs  Lab 03/02/19 0440 03/03/19 0500 03/04/19 0334 03/05/19 0423 03/06/19 0323  AST 8* 15 12* 9* 9*  ALT 9 11 10 8 9   ALKPHOS 67 62 65 70 67  BILITOT 0.9 0.6 0.6 0.5 0.4  PROT 6.1* 5.4* 5.4* 5.5* 5.7*  ALBUMIN 3.0* 2.6* 2.6* 2.7* 2.6*   No results for input(s): LIPASE, AMYLASE in the last 168 hours. No results for input(s): AMMONIA in the last 168 hours. CBC: Recent Labs  Lab 03/02/19 0440 03/03/19 0500 03/04/19 0334 03/05/19 0423 03/06/19 0323  WBC 8.2 6.1 6.8 5.3 5.4  NEUTROABS  --  3.4 4.4 2.1 2.0  HGB 8.6* 8.1* 8.4* 8.5* 8.4*  HCT 28.4* 26.0* 26.5* 27.6* 27.1*  MCV 84.5 82.8 82.8 83.6 83.4  PLT 364 300 328 357 380   Cardiac Enzymes: No results for input(s): CKTOTAL, CKMB, CKMBINDEX, TROPONINI in the last 168 hours. BNP: BNP (last 3 results) No results for input(s): BNP in the last 8760 hours.  ProBNP (last 3 results) No results for input(s): PROBNP in the last 8760 hours.  CBG: Recent Labs  Lab 03/06/19 0010 03/06/19 0419 03/06/19 0450 03/06/19 0729 03/06/19 0958  GLUCAP 307* 48* 65* 240*  212*       Signed:  Fayrene Helper MD.  [facility] 03/06/2019, 5:03 PM

## 2019-03-14 ENCOUNTER — Other Ambulatory Visit: Payer: Self-pay

## 2019-03-14 DIAGNOSIS — I110 Hypertensive heart disease with heart failure: Secondary | ICD-10-CM | POA: Insufficient documentation

## 2019-03-14 DIAGNOSIS — Z9049 Acquired absence of other specified parts of digestive tract: Secondary | ICD-10-CM | POA: Diagnosis not present

## 2019-03-14 DIAGNOSIS — R1110 Vomiting, unspecified: Secondary | ICD-10-CM | POA: Diagnosis present

## 2019-03-14 DIAGNOSIS — Z79899 Other long term (current) drug therapy: Secondary | ICD-10-CM | POA: Insufficient documentation

## 2019-03-14 DIAGNOSIS — Z91010 Allergy to peanuts: Secondary | ICD-10-CM | POA: Diagnosis not present

## 2019-03-14 DIAGNOSIS — K3184 Gastroparesis: Secondary | ICD-10-CM | POA: Diagnosis not present

## 2019-03-14 DIAGNOSIS — I5032 Chronic diastolic (congestive) heart failure: Secondary | ICD-10-CM | POA: Diagnosis not present

## 2019-03-14 DIAGNOSIS — J45909 Unspecified asthma, uncomplicated: Secondary | ICD-10-CM | POA: Insufficient documentation

## 2019-03-14 DIAGNOSIS — E109 Type 1 diabetes mellitus without complications: Secondary | ICD-10-CM | POA: Insufficient documentation

## 2019-03-14 DIAGNOSIS — E1043 Type 1 diabetes mellitus with diabetic autonomic (poly)neuropathy: Secondary | ICD-10-CM | POA: Insufficient documentation

## 2019-03-14 LAB — CBG MONITORING, ED: Glucose-Capillary: 229 mg/dL — ABNORMAL HIGH (ref 70–99)

## 2019-03-15 ENCOUNTER — Emergency Department (HOSPITAL_COMMUNITY)
Admission: EM | Admit: 2019-03-15 | Discharge: 2019-03-15 | Disposition: A | Discharge status: Home / Self Care | Payer: BC Managed Care – PPO | Source: Home / Self Care | Attending: Emergency Medicine | Admitting: Emergency Medicine

## 2019-03-15 ENCOUNTER — Encounter (HOSPITAL_COMMUNITY): Payer: Self-pay | Admitting: Emergency Medicine

## 2019-03-15 ENCOUNTER — Encounter (HOSPITAL_COMMUNITY): Payer: Self-pay

## 2019-03-15 ENCOUNTER — Emergency Department (HOSPITAL_COMMUNITY)
Admission: EM | Admit: 2019-03-15 | Discharge: 2019-03-15 | Disposition: A | Discharge status: Home / Self Care | Payer: BC Managed Care – PPO | Attending: Emergency Medicine | Admitting: Emergency Medicine

## 2019-03-15 ENCOUNTER — Other Ambulatory Visit: Payer: Self-pay

## 2019-03-15 DIAGNOSIS — K3184 Gastroparesis: Secondary | ICD-10-CM

## 2019-03-15 DIAGNOSIS — Z79899 Other long term (current) drug therapy: Secondary | ICD-10-CM | POA: Insufficient documentation

## 2019-03-15 DIAGNOSIS — Z794 Long term (current) use of insulin: Secondary | ICD-10-CM | POA: Insufficient documentation

## 2019-03-15 DIAGNOSIS — J45909 Unspecified asthma, uncomplicated: Secondary | ICD-10-CM | POA: Insufficient documentation

## 2019-03-15 DIAGNOSIS — E109 Type 1 diabetes mellitus without complications: Secondary | ICD-10-CM | POA: Insufficient documentation

## 2019-03-15 DIAGNOSIS — I5032 Chronic diastolic (congestive) heart failure: Secondary | ICD-10-CM | POA: Insufficient documentation

## 2019-03-15 DIAGNOSIS — I110 Hypertensive heart disease with heart failure: Secondary | ICD-10-CM | POA: Insufficient documentation

## 2019-03-15 LAB — CBG MONITORING, ED
Glucose-Capillary: 24 mg/dL — CL (ref 70–99)
Glucose-Capillary: 257 mg/dL — ABNORMAL HIGH (ref 70–99)
Glucose-Capillary: 289 mg/dL — ABNORMAL HIGH (ref 70–99)

## 2019-03-15 LAB — CBC WITH DIFFERENTIAL/PLATELET
Abs Immature Granulocytes: 0.04 10*3/uL (ref 0.00–0.07)
Basophils Absolute: 0 10*3/uL (ref 0.0–0.1)
Basophils Relative: 0 %
Eosinophils Absolute: 0.1 10*3/uL (ref 0.0–0.5)
Eosinophils Relative: 1 %
HCT: 33.3 % — ABNORMAL LOW (ref 36.0–46.0)
Hemoglobin: 10.4 g/dL — ABNORMAL LOW (ref 12.0–15.0)
Immature Granulocytes: 0 %
Lymphocytes Relative: 20 %
Lymphs Abs: 2.1 10*3/uL (ref 0.7–4.0)
MCH: 26.9 pg (ref 26.0–34.0)
MCHC: 31.2 g/dL (ref 30.0–36.0)
MCV: 86.3 fL (ref 80.0–100.0)
Monocytes Absolute: 0.5 10*3/uL (ref 0.1–1.0)
Monocytes Relative: 5 %
Neutro Abs: 7.8 10*3/uL — ABNORMAL HIGH (ref 1.7–7.7)
Neutrophils Relative %: 74 %
Platelets: 482 10*3/uL — ABNORMAL HIGH (ref 150–400)
RBC: 3.86 MIL/uL — ABNORMAL LOW (ref 3.87–5.11)
RDW: 18.3 % — ABNORMAL HIGH (ref 11.5–15.5)
WBC: 10.6 10*3/uL — ABNORMAL HIGH (ref 4.0–10.5)
nRBC: 0 % (ref 0.0–0.2)

## 2019-03-15 LAB — I-STAT BETA HCG BLOOD, ED (MC, WL, AP ONLY): I-stat hCG, quantitative: <5 m[IU]/mL (ref <5)

## 2019-03-15 LAB — COMPREHENSIVE METABOLIC PANEL
ALT: 8 U/L (ref 0–44)
AST: 13 U/L — ABNORMAL LOW (ref 15–41)
Albumin: 4 g/dL (ref 3.5–5.0)
Alkaline Phosphatase: 75 U/L (ref 38–126)
Anion gap: 13 (ref 5–15)
BUN: 25 mg/dL — ABNORMAL HIGH (ref 6–20)
CO2: 24 mmol/L (ref 22–32)
Calcium: 9.4 mg/dL (ref 8.9–10.3)
Chloride: 99 mmol/L (ref 98–111)
Creatinine, Ser: 0.91 mg/dL (ref 0.44–1.00)
GFR calc Af Amer: >60 mL/min (ref >60)
GFR calc non Af Amer: >60 mL/min (ref >60)
Glucose, Bld: 300 mg/dL — ABNORMAL HIGH (ref 70–99)
Potassium: 3.6 mmol/L (ref 3.5–5.1)
Sodium: 136 mmol/L (ref 135–145)
Total Bilirubin: 0.8 mg/dL (ref 0.3–1.2)
Total Protein: 7.6 g/dL (ref 6.5–8.1)

## 2019-03-15 LAB — LIPASE, BLOOD: Lipase: 14 U/L (ref 11–51)

## 2019-03-15 MED ORDER — DEXTROSE 50 % IV SOLN
1.0000 | Freq: Once | INTRAVENOUS | Status: AC
  Administered 2019-03-15: 10:00:00 50 mL via INTRAVENOUS
  Filled 2019-03-15: qty 50

## 2019-03-15 MED ORDER — OXYCODONE-ACETAMINOPHEN 5-325 MG PO TABS
1.0000 | ORAL_TABLET | Freq: Once | ORAL | Status: AC
  Administered 2019-03-15: 1 via ORAL
  Filled 2019-03-15: qty 1

## 2019-03-15 MED ORDER — HYDROMORPHONE HCL 1 MG/ML IJ SOLN
1.0000 mg | Freq: Once | INTRAMUSCULAR | Status: AC
  Administered 2019-03-15: 09:00:00 1 mg via INTRAMUSCULAR
  Filled 2019-03-15: qty 1

## 2019-03-15 MED ORDER — PROMETHAZINE HCL 25 MG RE SUPP: 25.0000 mg | Freq: Four times a day (QID) | RECTAL | 0 refills | Status: DC | PRN

## 2019-03-15 MED ORDER — LACTATED RINGERS IV BOLUS
2000.0000 mL | Freq: Once | INTRAVENOUS | Status: AC
  Administered 2019-03-15: 2000 mL via INTRAVENOUS

## 2019-03-15 MED ORDER — PROMETHAZINE HCL 25 MG PO TABS
50.0000 mg | ORAL_TABLET | Freq: Once | ORAL | Status: AC
  Administered 2019-03-15: 09:00:00 50 mg via ORAL
  Filled 2019-03-15: qty 2

## 2019-03-15 MED ORDER — DROPERIDOL 2.5 MG/ML IJ SOLN
1.2500 mg | Freq: Once | INTRAMUSCULAR | Status: AC
  Administered 2019-03-15: 1.25 mg via INTRAVENOUS
  Filled 2019-03-15: qty 2

## 2019-03-15 MED ORDER — GLUCOSE 40 % PO GEL
1.0000 | Freq: Once | ORAL | Status: AC
  Administered 2019-03-15: 10:00:00 37.5 g via ORAL
  Filled 2019-03-15: qty 1

## 2019-03-15 MED ORDER — FENTANYL CITRATE (PF) 100 MCG/2ML IJ SOLN
50.0000 ug | Freq: Once | INTRAMUSCULAR | Status: AC
  Administered 2019-03-15: 50 ug via INTRAVENOUS
  Filled 2019-03-15: qty 2

## 2019-03-15 MED ORDER — METOCLOPRAMIDE HCL 10 MG PO TABS: 10.0000 mg | ORAL_TABLET | Freq: Two times a day (BID) | ORAL | 0 refills | Status: DC

## 2019-03-15 MED ORDER — DROPERIDOL 2.5 MG/ML IJ SOLN
2.5000 mg | Freq: Once | INTRAMUSCULAR | Status: AC
  Administered 2019-03-15: 09:00:00 2.5 mg via INTRAMUSCULAR
  Filled 2019-03-15: qty 2

## 2019-03-15 MED ORDER — HEPARIN SOD (PORK) LOCK FLUSH 100 UNIT/ML IV SOLN
500.0000 [IU] | Freq: Once | INTRAVENOUS | Status: AC
  Administered 2019-03-15: 13:00:00 500 [IU]
  Filled 2019-03-15: qty 5

## 2019-03-15 MED ORDER — DIPHENHYDRAMINE HCL 50 MG/ML IJ SOLN
25.0000 mg | Freq: Once | INTRAMUSCULAR | Status: AC
  Administered 2019-03-15: 25 mg via INTRAVENOUS
  Filled 2019-03-15: qty 1

## 2019-03-15 MED ORDER — HYDROMORPHONE HCL 1 MG/ML IJ SOLN
1.0000 mg | Freq: Once | INTRAMUSCULAR | Status: AC
  Administered 2019-03-15: 1 mg via INTRAVENOUS
  Filled 2019-03-15: qty 1

## 2019-03-15 MED ORDER — HEPARIN SOD (PORK) LOCK FLUSH 100 UNIT/ML IV SOLN
INTRAVENOUS | Status: AC
  Filled 2019-03-15: qty 5

## 2019-03-15 MED ORDER — METOCLOPRAMIDE HCL 10 MG PO TABS
20.0000 mg | ORAL_TABLET | Freq: Once | ORAL | Status: AC
  Administered 2019-03-15: 11:00:00 20 mg via ORAL
  Filled 2019-03-15: qty 2

## 2019-03-15 MED ORDER — KETOROLAC TROMETHAMINE 15 MG/ML IJ SOLN
15.0000 mg | Freq: Once | INTRAMUSCULAR | Status: AC
  Administered 2019-03-15: 11:00:00 15 mg via INTRAVENOUS
  Filled 2019-03-15: qty 1

## 2019-03-15 NOTE — ED Triage Notes (Signed)
Pt reports she left here around 4am and when shegot home the symptoms started again of abd pains vomiting.

## 2019-03-15 NOTE — ED Notes (Signed)
Pt's CBG reported to be 24, patient is alert and oriented and appears in no active distress at this time. Patient given apple juice with extra sugar and is drinking appropriately. Patient given more apple juice at this time and is requesting saltine crackers and broth

## 2019-03-15 NOTE — ED Notes (Signed)
Patient given saltine crackers and broth. Patient has had no emesis at this time.  Patient assisted to the bathroom utilizing the Clarise Cruz steady to d/c risk of falls. Patient back in bed with no incident

## 2019-03-15 NOTE — ED Provider Notes (Signed)
[facility] DEPT Provider Note   CSN: [id number] Arrival date & time: 03/14/19  2343     History Chief Complaint  Patient presents with  . Abdominal Pain    [name] is a 29 y.o. female.  29 year old female who presents the emergency department with a chief complaint of vomiting.  Patient is well-known to this emergency department and has severe and chronic recurrent episodes of diabetic gastroparesis with intractable vomiting.  She states she has been vomiting for approximately 2 days nonstop, unable to hold down any foods or fluids.  She states her sugars have been running high.  She has severe abdominal pain which is the same as her normal pain with her gastroparesis episodes.  She denies any urinary symptoms, fevers, chills, diarrhea.  HPI     Past Medical History:  Diagnosis Date  . Anxiety   . Arthritis    "hands, feet, knees" (12/18/2016)  . Asthma   . Diabetic gastroparesis ([place])    Per gastric emptying study 07/09/16 which showed significant delayed gastric emptying.  . Gallstones   . Gastroparesis   . GERD (gastroesophageal reflux disease)   . Heart murmur   . Hepatic steatosis 11/26/2014   and hepatomegaly  . Hypertension    hx (12/18/2016)  . Intractable cyclical vomiting syndrome    Archie Endo 12/18/2016  . Liver mass 11/26/2014  . Pancreatitis, acute 11/26/2014  . Pneumonia    "as a teen X 1" (12/18/2016)  . Type I diabetes mellitus ([place]) 2007   IDDM.  poorly controlled, multiple admits with DKA    Patient Active Problem List   Diagnosis Date Noted  . Intractable cyclical vomiting with nausea 02/26/2019  . Gastroparesis due to secondary diabetes ([place]) 11/21/2018  . Diabetes mellitus ([place]) 11/13/2018  . HTN (hypertension) 11/13/2018  . Marijuana abuse 11/13/2018  . Acute pyelonephritis 10/07/2018  . Hydronephrosis of right kidney 10/05/2018  . Diffuse pain   . Elevated blood pressure reading 12/03/2017  .  Tachycardia 11/30/2017  . Hypernatremia 11/02/2017  . Nausea and vomiting 01/22/2017  . Nausea & vomiting 01/21/2017  . Intractable cyclical vomiting syndrome 12/18/2016  . Leukocytosis 10/24/2016  . N&V (nausea and vomiting) 10/23/2016  . DKA, type 1 ([place]) 09/26/2016  . Thrombocytosis ([place]) 07/08/2016  . Candiduria, asymptomatic 06/23/2016  . Hyponatremia 06/13/2016  . Hematuria 05/13/2016  . UTI (urinary tract infection) 01/22/2016  . Diarrhea 11/09/2015  . Acute urinary retention   . Gastroparesis due to DM ([place]) 07/10/2015  . GERD (gastroesophageal reflux disease) 07/10/2015  . Depression with anxiety 07/10/2015  . Gastroparesis 06/22/2015  . Altered mental status 06/22/2015  . Volume depletion 06/10/2015  . Protein-calorie malnutrition, severe 06/10/2015  . Hyperglycemia   . Elevated bilirubin   . Hematemesis with nausea   . Intractable nausea and vomiting 05/28/2015  . Abdominal pain in female   . Abdominal pain 05/24/2015  . Hypertension 05/24/2015  . Dehydration   . Diabetic gastroparesis ([place])   . Chronic diastolic heart failure ([place]) 04/11/2015  . Hematemesis 04/08/2015  . DKA (diabetic ketoacidoses) ([place]) 03/22/2015  . S/P laparoscopic cholecystectomy 02/11/2015  . Postextubation stridor   . Pancreatitis, acute 11/26/2014  . Volume overload 11/26/2014  . Hypokalemia 11/26/2014  . Hepatic steatosis 11/26/2014  . Liver mass 11/26/2014  . Sepsis ([place]) 11/25/2014  . Sinus tachycardia 11/25/2014  . Hypomagnesemia 11/25/2014  . Hypophosphatemia 11/25/2014  . Elevated LFTs 11/24/2014  . AKI (acute kidney injury) ([place]) 11/24/2014  . Migraine headache  11/24/2014  . Asthma 06/29/2012  . Uncontrolled type 1 diabetes mellitus ([place]) 06/19/2010  . Goiter, unspecified 06/19/2010    Past Surgical History:  Procedure Laterality Date  . CHOLECYSTECTOMY N/A 02/11/2015   Procedure: LAPAROSCOPIC CHOLECYSTECTOMY WITH INTRAOPERATIVE CHOLANGIOGRAM;  Surgeon: Greer Pickerel, MD;   Location: [facility] ORS;  Service: General;  Laterality: N/A;  . ESOPHAGOGASTRODUODENOSCOPY (EGD) WITH PROPOFOL Left 09/20/2014   Procedure: ESOPHAGOGASTRODUODENOSCOPY (EGD) WITH PROPOFOL;  Surgeon: Arta Silence, MD;  Location: [facility] ENDOSCOPY;  Service: Endoscopy;  Laterality: Left;  . WISDOM TOOTH EXTRACTION       OB History    Gravida  2   Para  1   Term  0   Preterm  1   AB  1   Living  1     SAB  0   TAB  1   Ectopic  0   Multiple  0   Live Births  1           Family History  Problem Relation Age of Onset  . Heart disease Maternal Grandmother   . Heart disease Maternal Grandfather   . Diabetes Mother   . Hyperlipidemia Mother   . Hypertension Father   . Heart disease Father   . Hypertension Paternal Grandmother   . Cancer Paternal Grandfather     Social History   Tobacco Use  . Smoking status: Never Smoker  . Smokeless tobacco: Never Used  Substance Use Topics  . Alcohol use: No  . Drug use: Not Currently    Types: Marijuana    Home Medications Prior to Admission medications   Medication Sig Start Date End Date Taking? Authorizing Provider  albuterol (PROVENTIL HFA;VENTOLIN HFA) 108 (90 Base) MCG/ACT inhaler Inhale 1-2 puffs into the lungs every 6 (six) hours as needed for wheezing or shortness of breath.    [provider]  blood glucose meter kit and supplies Dispense based on patient and insurance preference. Use up to four times daily as directed. (FOR ICD-10 E10.9, E11.9). 12/07/17   Eugenie Filler, MD  ferrous sulfate 324 (65 Fe) MG TBEC Take 1 tablet by mouth every other day. 02/08/19   [provider]  glucagon (GLUCAGON EMERGENCY) 1 MG injection Inject 1 mg into the muscle once as needed (For very low blood sugars. May repeat in 15-20 minutes if needed.). 12/20/16   Hongalgi, Lenis Dickinson, MD  Insulin Human (INSULIN PUMP) SOLN Inject 0-10 each into the skin continuous. Per sliding scale - Novolog 100 units/ml    [provider]  Insulin Pen Needle 31G X 5 MM MISC 30 Units by Does not apply route at bedtime. 12/07/17   Eugenie Filler, MD  lisinopril (ZESTRIL) 2.5 MG tablet Take 2.5 mg by mouth daily.    [provider]  metoCLOPramide (REGLAN) 10 MG tablet Take 1 tablet (10 mg total) by mouth 3 (three) times daily before meals. 03/06/19 04/05/19  Elodia Florence., MD  pantoprazole (PROTONIX) 40 MG tablet Take 1 tablet (40 mg total) by mouth daily. 03/06/19 04/05/19  Elodia Florence., MD  polyethylene glycol (MIRALAX / GLYCOLAX) 17 g packet Take 17 g by mouth daily for 14 days. 03/06/19 03/20/19  Elodia Florence., MD  promethazine (PHENERGAN) 25 MG suppository Place 1 suppository (25 mg total) rectally every 6 (six) hours as needed (If unable to take oral phenergan). 02/23/19   Mariel Aloe, MD  promethazine (PHENERGAN) 25 MG tablet Take 1 tablet (25 mg  total) by mouth every 6 (six) hours as needed for nausea or vomiting. 03/06/19   Elodia Florence., MD  traZODone (DESYREL) 50 MG tablet Take 50 mg by mouth at bedtime.  11/30/18   [provider]    Allergies    Peanut-containing drug products, Food, and Ultram [tramadol]  Review of Systems   Review of Systems Ten systems reviewed and are negative for acute change, except as noted in the HPI.   Physical Exam Updated Vital Signs BP 122/80 (BP Location: Right Arm)   Pulse (!) 130   Temp 98 F (36.7 C) (Oral)   Resp 18   LMP 02/26/2019   SpO2 99%   Physical Exam Vitals and nursing note reviewed.  Constitutional:      General: She is not in acute distress.    Appearance: She is well-developed. She is ill-appearing. She is not diaphoretic.     Comments: Patient writhing around in bed, sitting up and intermittently vomiting.  HENT:     Head: Normocephalic and atraumatic.  Eyes:     General: No scleral icterus.    Conjunctiva/sclera: Conjunctivae normal.  Cardiovascular:     Rate and Rhythm: Normal rate and  regular rhythm.     Heart sounds: Normal heart sounds. No murmur. No friction rub. No gallop.   Pulmonary:     Effort: Pulmonary effort is normal. No respiratory distress.     Breath sounds: Normal breath sounds.  Abdominal:     General: Abdomen is scaphoid. Bowel sounds are normal. There is no distension.     Palpations: Abdomen is soft. There is no mass.     Tenderness: There is abdominal tenderness (Diffuse abdominal tenderness). There is no guarding.    Musculoskeletal:     Cervical back: Normal range of motion.  Skin:    General: Skin is warm and dry.  Neurological:     Mental Status: She is alert and oriented to person, place, and time.  Psychiatric:        Behavior: Behavior normal.     ED Results / Procedures / Treatments   Labs (all labs ordered are listed, but only abnormal results are displayed) Labs Reviewed  CBG MONITORING, ED - Abnormal; Notable for the following components:      Result Value   Glucose-Capillary 229 (*)    All other components within normal limits    EKG EKG Interpretation  Date/Time:  Wednesday March 15 2019 01:39:50 EST Ventricular Rate:  135 PR Interval:    QRS Duration: 82 QT Interval:  418 QTC Calculation: 627 R Axis:   82 Text Interpretation: Sinus tachycardia Consider right atrial enlargement LVH by voltage Prolonged QT interval Confirmed by Ripley Fraise [phone]) on 03/15/2019 1:55:53 AM   Radiology No results found.  Procedures Procedures (including critical care time)  Medications Ordered in ED Medications - No data to display  ED Course  I have reviewed the triage vital signs and the nursing notes.  Pertinent labs & imaging results that were available during my care of the patient were reviewed by me and considered in my medical decision making (see chart for details).  Clinical Course as of Mar 15 439  Wed Mar 15, 2019  0308 Potassium: 3.6 [AH]    Clinical Course User Index [AH] Margarita Mail, PA-C    MDM Rules/Calculators/A&P                      JA:[id number] and abdominal pain VS:  Vitals:   03/15/19 0140 03/15/19 0200 03/15/19 0300 03/15/19 0430  BP: (!) 93/57 97/67 128/75 (!) 141/96  Pulse: (!) 134 (!) 111 (!) 112 (!) 105  Resp: 15 (!) _0 Temp:      TempSrc:      SpO2: 100% 95% 100% 100%   4:43 AM Cardiac monitoring reveals HR 98- NSR , as reviewed and interpreted by me. Cardiac monitoring was ordered due to tachycardia and to monitor patient for dysrhythmia.  TI:[id number] is gathered by patient  and EMR. DDX:The emergent differential diagnosis for vomiting includes, but is not limited to ACS/MI, Boerhaave's, DKA, Intracranial Hemorrhage, Ischemic bowel, Meningitis, Sepsis, Acute radiation syndrome, Acute gastric dilation, Acetaminophen toxicity, Adrenal insufficiency, Appendicitis, Aspirin toxicity, Bowel obstruction/ileus, Carbon monoxide poisoning, Cholecystitis, CNS tumor. Digoxin toxicity, Electrolyte abnormalities, Elevated ICP, Gastric outlet obstruction, Hyperemesis gravidarum, Pancreatitis, Peritonitis, Ruptured viscus, Testicular torsion/ovarian torsion, Theophyline toxicity, Biliary colic, Cannabinoid hyperemesis syndrome, Chemotherapy, Disulfiram effect, Erythromycin, ETOH, Gastritis, Gastroenteritis, Gastroparesis, Hepatitis, Ibuprofen, Ipecac toxicity, Labyrinthitis, Migraine, Motion sickness, Narcotic withdrawal, Thyroid, Pregnancy, Peptic ulcer disease, Renal colic, and UTI Labs/ mdm: I reviewed the labs which show elevated blood glucose without anion gap or other metabolic abnormality. CBC shows mildly elevated leukocytosis and anemia which is improved from baseline and not likely due to volume contraction. Patient lipase wnl. Negative pregnancy Patient has had no more vomiting and has tolerated po fluids. Patient still having pain and givne a dose of fentanyl pt dc. Pat Imaging:  EKG: Initial EKG tachycardic to 135 which showed a QTC of 670.  She was given  droperidol prior to EKG imaging.  Repeat EKG shows great improvement with a new QTC of 439 MDM: Patient with gastroparesis.  She has no active vomiting.  She appears appropriate for discharge at this time. Patient disposition:discharge Patient condition: improved. The patient appears reasonably screened and/or stabilized for discharge and I doubt any other medical condition or other [facility] requiring further screening, evaluation, or treatment in the ED at this time prior to discharge. I have discussed lab and/or imaging findings with the patient and answered all questions/concerns to the best of my ability. I have discussed return precautions and OP follow up.     Final Clinical Impression(s) / ED Diagnoses Final diagnoses:  Gastroparesis    Rx / DC Orders ED Discharge Orders    None       Margarita Mail, PA-C 03/15/19 0458    Ripley Fraise, MD 03/15/19 0700

## 2019-03-15 NOTE — ED Notes (Signed)
Patient's insulin pump has reportedly been stopped by the patient at this time.

## 2019-03-15 NOTE — Discharge Instructions (Signed)
We saw you in the ER for the vomiting and pain. The symptoms appear to be due to gastroparesis.  Please take the medication as prescribed and follow-up with your primary doctor.

## 2019-03-15 NOTE — Discharge Instructions (Addendum)
Get help right away if you: Have severe abdominal pain that does not improve with treatment. Have nausea that is severe or does not go away. Cannot drink fluids without vomiting.

## 2019-03-15 NOTE — ED Triage Notes (Signed)
Pt complains of abdominal pain since this morning, pt complains of vomiting no diarrhea

## 2019-03-16 ENCOUNTER — Encounter (HOSPITAL_COMMUNITY): Payer: Self-pay | Admitting: Emergency Medicine

## 2019-03-16 ENCOUNTER — Inpatient Hospital Stay (HOSPITAL_COMMUNITY)
Admission: EM | Admit: 2019-03-16 | Discharge: 2019-04-17 | DRG: 073 | Disposition: A | Discharge status: Home / Self Care | Payer: BC Managed Care – PPO | Attending: Internal Medicine | Admitting: Internal Medicine

## 2019-03-16 ENCOUNTER — Other Ambulatory Visit: Payer: Self-pay

## 2019-03-16 DIAGNOSIS — Z8349 Family history of other endocrine, nutritional and metabolic diseases: Secondary | ICD-10-CM

## 2019-03-16 DIAGNOSIS — Z20822 Contact with and (suspected) exposure to covid-19: Secondary | ICD-10-CM | POA: Diagnosis present

## 2019-03-16 DIAGNOSIS — Z681 Body mass index (BMI) 19 or less, adult: Secondary | ICD-10-CM

## 2019-03-16 DIAGNOSIS — R1084 Generalized abdominal pain: Secondary | ICD-10-CM | POA: Diagnosis not present

## 2019-03-16 DIAGNOSIS — Z4659 Encounter for fitting and adjustment of other gastrointestinal appliance and device: Secondary | ICD-10-CM

## 2019-03-16 DIAGNOSIS — R112 Nausea with vomiting, unspecified: Secondary | ICD-10-CM | POA: Diagnosis present

## 2019-03-16 DIAGNOSIS — E1065 Type 1 diabetes mellitus with hyperglycemia: Secondary | ICD-10-CM | POA: Diagnosis present

## 2019-03-16 DIAGNOSIS — K2100 Gastro-esophageal reflux disease with esophagitis, without bleeding: Secondary | ICD-10-CM | POA: Diagnosis present

## 2019-03-16 DIAGNOSIS — I340 Nonrheumatic mitral (valve) insufficiency: Secondary | ICD-10-CM | POA: Diagnosis not present

## 2019-03-16 DIAGNOSIS — E1143 Type 2 diabetes mellitus with diabetic autonomic (poly)neuropathy: Secondary | ICD-10-CM | POA: Diagnosis not present

## 2019-03-16 DIAGNOSIS — N3000 Acute cystitis without hematuria: Secondary | ICD-10-CM | POA: Diagnosis present

## 2019-03-16 DIAGNOSIS — J45909 Unspecified asthma, uncomplicated: Secondary | ICD-10-CM | POA: Diagnosis present

## 2019-03-16 DIAGNOSIS — K2210 Ulcer of esophagus without bleeding: Secondary | ICD-10-CM | POA: Diagnosis present

## 2019-03-16 DIAGNOSIS — E876 Hypokalemia: Secondary | ICD-10-CM | POA: Diagnosis not present

## 2019-03-16 DIAGNOSIS — K3184 Gastroparesis: Secondary | ICD-10-CM | POA: Diagnosis present

## 2019-03-16 DIAGNOSIS — E878 Other disorders of electrolyte and fluid balance, not elsewhere classified: Secondary | ICD-10-CM | POA: Diagnosis not present

## 2019-03-16 DIAGNOSIS — Z765 Malingerer [conscious simulation]: Secondary | ICD-10-CM

## 2019-03-16 DIAGNOSIS — Z9641 Presence of insulin pump (external) (internal): Secondary | ICD-10-CM | POA: Diagnosis present

## 2019-03-16 DIAGNOSIS — K5900 Constipation, unspecified: Secondary | ICD-10-CM

## 2019-03-16 DIAGNOSIS — E119 Type 2 diabetes mellitus without complications: Secondary | ICD-10-CM

## 2019-03-16 DIAGNOSIS — Z91018 Allergy to other foods: Secondary | ICD-10-CM

## 2019-03-16 DIAGNOSIS — E1043 Type 1 diabetes mellitus with diabetic autonomic (poly)neuropathy: Secondary | ICD-10-CM | POA: Diagnosis present

## 2019-03-16 DIAGNOSIS — R109 Unspecified abdominal pain: Secondary | ICD-10-CM

## 2019-03-16 DIAGNOSIS — T85598A Other mechanical complication of other gastrointestinal prosthetic devices, implants and grafts, initial encounter: Secondary | ICD-10-CM

## 2019-03-16 DIAGNOSIS — I319 Disease of pericardium, unspecified: Secondary | ICD-10-CM | POA: Diagnosis present

## 2019-03-16 DIAGNOSIS — Z9049 Acquired absence of other specified parts of digestive tract: Secondary | ICD-10-CM

## 2019-03-16 DIAGNOSIS — D509 Iron deficiency anemia, unspecified: Secondary | ICD-10-CM | POA: Diagnosis present

## 2019-03-16 DIAGNOSIS — R7881 Bacteremia: Secondary | ICD-10-CM | POA: Diagnosis present

## 2019-03-16 DIAGNOSIS — E10649 Type 1 diabetes mellitus with hypoglycemia without coma: Secondary | ICD-10-CM | POA: Diagnosis not present

## 2019-03-16 DIAGNOSIS — N319 Neuromuscular dysfunction of bladder, unspecified: Secondary | ICD-10-CM | POA: Diagnosis present

## 2019-03-16 DIAGNOSIS — D638 Anemia in other chronic diseases classified elsewhere: Secondary | ICD-10-CM | POA: Diagnosis present

## 2019-03-16 DIAGNOSIS — R000 Tachycardia, unspecified: Secondary | ICD-10-CM | POA: Diagnosis not present

## 2019-03-16 DIAGNOSIS — B957 Other staphylococcus as the cause of diseases classified elsewhere: Secondary | ICD-10-CM | POA: Diagnosis present

## 2019-03-16 DIAGNOSIS — E43 Unspecified severe protein-calorie malnutrition: Secondary | ICD-10-CM | POA: Diagnosis present

## 2019-03-16 DIAGNOSIS — Z885 Allergy status to narcotic agent status: Secondary | ICD-10-CM

## 2019-03-16 DIAGNOSIS — E860 Dehydration: Secondary | ICD-10-CM | POA: Diagnosis present

## 2019-03-16 DIAGNOSIS — Z79899 Other long term (current) drug therapy: Secondary | ICD-10-CM

## 2019-03-16 DIAGNOSIS — Z794 Long term (current) use of insulin: Secondary | ICD-10-CM

## 2019-03-16 DIAGNOSIS — Z8249 Family history of ischemic heart disease and other diseases of the circulatory system: Secondary | ICD-10-CM

## 2019-03-16 DIAGNOSIS — Z8719 Personal history of other diseases of the digestive system: Secondary | ICD-10-CM

## 2019-03-16 DIAGNOSIS — I10 Essential (primary) hypertension: Secondary | ICD-10-CM | POA: Diagnosis present

## 2019-03-16 DIAGNOSIS — Z833 Family history of diabetes mellitus: Secondary | ICD-10-CM

## 2019-03-16 LAB — URINALYSIS, ROUTINE W REFLEX MICROSCOPIC
Bilirubin Urine: NEGATIVE
Glucose, UA: 50 mg/dL — AB
Ketones, ur: 20 mg/dL — AB
Nitrite: NEGATIVE
Protein, ur: 100 mg/dL — AB
RBC / HPF: >50 RBC/hpf — ABNORMAL HIGH (ref 0–5)
Specific Gravity, Urine: 1.016 (ref 1.005–1.030)
WBC, UA: >50 WBC/hpf — ABNORMAL HIGH (ref 0–5)
pH: 5 (ref 5.0–8.0)

## 2019-03-16 LAB — CBC WITH DIFFERENTIAL/PLATELET
Abs Immature Granulocytes: 0.02 10*3/uL (ref 0.00–0.07)
Basophils Absolute: 0 10*3/uL (ref 0.0–0.1)
Basophils Relative: 0 %
Eosinophils Absolute: 0.1 10*3/uL (ref 0.0–0.5)
Eosinophils Relative: 1 %
HCT: 33.8 % — ABNORMAL LOW (ref 36.0–46.0)
Hemoglobin: 10.5 g/dL — ABNORMAL LOW (ref 12.0–15.0)
Immature Granulocytes: 0 %
Lymphocytes Relative: 14 %
Lymphs Abs: 1.5 10*3/uL (ref 0.7–4.0)
MCH: 26.6 pg (ref 26.0–34.0)
MCHC: 31.1 g/dL (ref 30.0–36.0)
MCV: 85.6 fL (ref 80.0–100.0)
Monocytes Absolute: 0.8 10*3/uL (ref 0.1–1.0)
Monocytes Relative: 8 %
Neutro Abs: 7.9 10*3/uL — ABNORMAL HIGH (ref 1.7–7.7)
Neutrophils Relative %: 77 %
Platelets: 476 10*3/uL — ABNORMAL HIGH (ref 150–400)
RBC: 3.95 MIL/uL (ref 3.87–5.11)
RDW: 18 % — ABNORMAL HIGH (ref 11.5–15.5)
WBC: 10.4 10*3/uL (ref 4.0–10.5)
nRBC: 0 % (ref 0.0–0.2)

## 2019-03-16 LAB — COMPREHENSIVE METABOLIC PANEL
ALT: 8 U/L (ref 0–44)
AST: 10 U/L — ABNORMAL LOW (ref 15–41)
Albumin: 3.7 g/dL (ref 3.5–5.0)
Alkaline Phosphatase: 80 U/L (ref 38–126)
Anion gap: 13 (ref 5–15)
BUN: 21 mg/dL — ABNORMAL HIGH (ref 6–20)
CO2: 25 mmol/L (ref 22–32)
Calcium: 9.5 mg/dL (ref 8.9–10.3)
Chloride: 99 mmol/L (ref 98–111)
Creatinine, Ser: 0.99 mg/dL (ref 0.44–1.00)
GFR calc Af Amer: >60 mL/min (ref >60)
GFR calc non Af Amer: >60 mL/min (ref >60)
Glucose, Bld: 200 mg/dL — ABNORMAL HIGH (ref 70–99)
Potassium: 3.6 mmol/L (ref 3.5–5.1)
Sodium: 137 mmol/L (ref 135–145)
Total Bilirubin: 1.1 mg/dL (ref 0.3–1.2)
Total Protein: 7.4 g/dL (ref 6.5–8.1)

## 2019-03-16 LAB — LACTIC ACID, PLASMA: Lactic Acid, Venous: 0.6 mmol/L (ref 0.5–1.9)

## 2019-03-16 LAB — I-STAT BETA HCG BLOOD, ED (MC, WL, AP ONLY): I-stat hCG, quantitative: <5 m[IU]/mL (ref <5)

## 2019-03-16 LAB — CBG MONITORING, ED
Glucose-Capillary: 159 mg/dL — ABNORMAL HIGH (ref 70–99)
Glucose-Capillary: 173 mg/dL — ABNORMAL HIGH (ref 70–99)

## 2019-03-16 LAB — MAGNESIUM: Magnesium: 1.9 mg/dL (ref 1.7–2.4)

## 2019-03-16 LAB — GLUCOSE, CAPILLARY
Glucose-Capillary: 279 mg/dL — ABNORMAL HIGH (ref 70–99)
Glucose-Capillary: 173 mg/dL — ABNORMAL HIGH (ref 70–99)

## 2019-03-16 LAB — RAPID URINE DRUG SCREEN, HOSP PERFORMED
Amphetamines: NOT DETECTED
Barbiturates: NOT DETECTED
Benzodiazepines: NOT DETECTED
Cocaine: NOT DETECTED
Opiates: POSITIVE — AB
Tetrahydrocannabinol: NOT DETECTED

## 2019-03-16 LAB — BLOOD GAS, VENOUS
Acid-Base Excess: 5.3 mmol/L — ABNORMAL HIGH (ref 0.0–2.0)
Bicarbonate: 27.9 mmol/L (ref 20.0–28.0)
O2 Saturation: 68.4 %
Patient temperature: 37
pCO2, Ven: 33.5 mmHg — ABNORMAL LOW (ref 44.0–60.0)
pH, Ven: 7.53 — ABNORMAL HIGH (ref 7.250–7.430)
pO2, Ven: 34.8 mmHg (ref 32.0–45.0)

## 2019-03-16 LAB — SARS CORONAVIRUS 2 (TAT 6-24 HRS): SARS Coronavirus 2: NEGATIVE

## 2019-03-16 LAB — LIPASE, BLOOD: Lipase: 14 U/L (ref 11–51)

## 2019-03-16 MED ORDER — SODIUM CHLORIDE 0.9% FLUSH
3.0000 mL | Freq: Two times a day (BID) | INTRAVENOUS | Status: DC
  Administered 2019-03-17 – 2019-03-20 (×5): 3 mL via INTRAVENOUS

## 2019-03-16 MED ORDER — PROMETHAZINE HCL 25 MG/ML IJ SOLN
12.5000 mg | Freq: Four times a day (QID) | INTRAMUSCULAR | Status: DC | PRN
  Administered 2019-03-17 – 2019-03-20 (×5): 12.5 mg via INTRAVENOUS
  Filled 2019-03-16 (×7): qty 1

## 2019-03-16 MED ORDER — INSULIN PUMP: Freq: Three times a day (TID) | SUBCUTANEOUS | Status: DC

## 2019-03-16 MED ORDER — ACETAMINOPHEN 325 MG PO TABS: 650.0000 mg | ORAL_TABLET | Freq: Four times a day (QID) | ORAL | Status: DC | PRN

## 2019-03-16 MED ORDER — INSULIN ASPART 100 UNIT/ML ~~LOC~~ SOLN
0.0000 [IU] | Freq: Three times a day (TID) | SUBCUTANEOUS | Status: DC
  Administered 2019-03-16: 19:00:00 5 [IU] via SUBCUTANEOUS
  Administered 2019-03-17: 12:00:00 2 [IU] via SUBCUTANEOUS
  Administered 2019-03-17: 1 [IU] via SUBCUTANEOUS
  Administered 2019-03-17: 08:00:00 7 [IU] via SUBCUTANEOUS
  Administered 2019-03-19 – 2019-03-20 (×3): 2 [IU] via SUBCUTANEOUS
  Administered 2019-03-20: 7 [IU] via SUBCUTANEOUS
  Administered 2019-03-21: 3 [IU] via SUBCUTANEOUS
  Administered 2019-03-22 (×2): 2 [IU] via SUBCUTANEOUS
  Filled 2019-03-16: qty 0.09

## 2019-03-16 MED ORDER — CHLORHEXIDINE GLUCONATE CLOTH 2 % EX PADS
6.0000 | MEDICATED_PAD | Freq: Every day | CUTANEOUS | Status: DC
  Administered 2019-03-18 – 2019-04-16 (×26): 6 via TOPICAL

## 2019-03-16 MED ORDER — ENOXAPARIN SODIUM 40 MG/0.4ML ~~LOC~~ SOLN
40.0000 mg | SUBCUTANEOUS | Status: DC
  Administered 2019-03-16 – 2019-03-18 (×2): 40 mg via SUBCUTANEOUS
  Filled 2019-03-16 (×5): qty 0.4

## 2019-03-16 MED ORDER — SODIUM CHLORIDE 0.9 % IV SOLN
1.0000 g | Freq: Once | INTRAVENOUS | Status: AC
  Administered 2019-03-16: 13:00:00 1 g via INTRAVENOUS
  Filled 2019-03-16: qty 10

## 2019-03-16 MED ORDER — FERROUS SULFATE 325 (65 FE) MG PO TABS
325.0000 mg | ORAL_TABLET | ORAL | Status: DC
  Administered 2019-03-16 – 2019-03-28 (×4): 325 mg via ORAL
  Filled 2019-03-16 (×6): qty 1

## 2019-03-16 MED ORDER — MAGNESIUM HYDROXIDE 400 MG/5ML PO SUSP
30.0000 mL | Freq: Every day | ORAL | Status: DC | PRN
  Administered 2019-03-30: 30 mL via ORAL
  Filled 2019-03-16: qty 30

## 2019-03-16 MED ORDER — INSULIN ASPART 100 UNIT/ML ~~LOC~~ SOLN
0.0000 [IU] | Freq: Every day | SUBCUTANEOUS | Status: DC
  Administered 2019-03-18 – 2019-03-29 (×3): 2 [IU] via SUBCUTANEOUS
  Administered 2019-03-30 – 2019-04-06 (×4): 3 [IU] via SUBCUTANEOUS

## 2019-03-16 MED ORDER — KETOROLAC TROMETHAMINE 30 MG/ML IJ SOLN
30.0000 mg | Freq: Four times a day (QID) | INTRAMUSCULAR | Status: AC | PRN
  Administered 2019-03-16 – 2019-03-19 (×7): 30 mg via INTRAVENOUS
  Filled 2019-03-16 (×8): qty 1

## 2019-03-16 MED ORDER — HYDROMORPHONE HCL 1 MG/ML IJ SOLN
1.0000 mg | Freq: Once | INTRAMUSCULAR | Status: AC
  Administered 2019-03-16: 1 mg via INTRAVENOUS
  Filled 2019-03-16: qty 1

## 2019-03-16 MED ORDER — SODIUM CHLORIDE 0.9 % IV SOLN
1.0000 g | Freq: Every day | INTRAVENOUS | Status: DC
  Administered 2019-03-17 – 2019-03-20 (×4): 1 g via INTRAVENOUS
  Filled 2019-03-16 (×4): qty 1

## 2019-03-16 MED ORDER — LACTATED RINGERS IV SOLN: INTRAVENOUS | Status: DC

## 2019-03-16 MED ORDER — SODIUM CHLORIDE 0.9 % IV BOLUS
1000.0000 mL | Freq: Once | INTRAVENOUS | Status: AC
  Administered 2019-03-16: 1000 mL via INTRAVENOUS

## 2019-03-16 MED ORDER — ONDANSETRON HCL 4 MG PO TABS
4.0000 mg | ORAL_TABLET | Freq: Four times a day (QID) | ORAL | Status: DC | PRN
  Administered 2019-03-16: 4 mg via ORAL
  Filled 2019-03-16: qty 1

## 2019-03-16 MED ORDER — KETOROLAC TROMETHAMINE 30 MG/ML IJ SOLN: 30.0000 mg | Freq: Once | INTRAMUSCULAR | Status: DC

## 2019-03-16 MED ORDER — ACETAMINOPHEN 650 MG RE SUPP: 650.0000 mg | Freq: Four times a day (QID) | RECTAL | Status: DC | PRN

## 2019-03-16 MED ORDER — ONDANSETRON HCL 4 MG/2ML IJ SOLN
4.0000 mg | Freq: Four times a day (QID) | INTRAMUSCULAR | Status: DC | PRN
  Administered 2019-03-17 – 2019-04-16 (×27): 4 mg via INTRAVENOUS
  Filled 2019-03-16 (×29): qty 2

## 2019-03-16 MED ORDER — METOCLOPRAMIDE HCL 5 MG/ML IJ SOLN
10.0000 mg | Freq: Three times a day (TID) | INTRAMUSCULAR | Status: DC
  Administered 2019-03-16 – 2019-03-21 (×14): 10 mg via INTRAVENOUS
  Filled 2019-03-16 (×14): qty 2

## 2019-03-16 MED ORDER — FLEET ENEMA 7-19 GM/118ML RE ENEM: 1.0000 | ENEMA | Freq: Once | RECTAL | Status: DC | PRN

## 2019-03-16 MED ORDER — PROMETHAZINE HCL 25 MG/ML IJ SOLN
25.0000 mg | Freq: Once | INTRAMUSCULAR | Status: AC
  Administered 2019-03-16: 25 mg via INTRAVENOUS
  Filled 2019-03-16: qty 1

## 2019-03-16 MED ORDER — DEXTROSE 50 % IV SOLN
1.0000 | Freq: Once | INTRAVENOUS | Status: AC
  Administered 2019-03-16: 15:00:00 50 mL via INTRAVENOUS
  Filled 2019-03-16: qty 50

## 2019-03-16 MED ORDER — ONDANSETRON HCL 4 MG/2ML IJ SOLN
4.0000 mg | Freq: Once | INTRAMUSCULAR | Status: AC
  Administered 2019-03-16: 4 mg via INTRAVENOUS
  Filled 2019-03-16: qty 2

## 2019-03-16 MED ORDER — KETOROLAC TROMETHAMINE 30 MG/ML IJ SOLN
30.0000 mg | Freq: Once | INTRAMUSCULAR | Status: AC
  Administered 2019-03-16: 30 mg via INTRAVENOUS
  Filled 2019-03-16: qty 1

## 2019-03-16 MED ORDER — SORBITOL 70 % SOLN: 30.0000 mL | Freq: Every day | Status: DC | PRN

## 2019-03-16 MED ORDER — INSULIN GLARGINE 100 UNIT/ML ~~LOC~~ SOLN
10.0000 [IU] | Freq: Two times a day (BID) | SUBCUTANEOUS | Status: DC
  Administered 2019-03-16 – 2019-03-19 (×6): 10 [IU] via SUBCUTANEOUS
  Filled 2019-03-16 (×9): qty 0.1

## 2019-03-16 MED ORDER — SENNA 8.6 MG PO TABS
1.0000 | ORAL_TABLET | Freq: Two times a day (BID) | ORAL | Status: DC
  Administered 2019-03-16 – 2019-04-07 (×25): 8.6 mg via ORAL
  Filled 2019-03-16 (×35): qty 1

## 2019-03-16 NOTE — ED Provider Notes (Signed)
[facility] DEPT Provider Note   CSN: [id number] Arrival date & time: 03/16/19  0909     History Chief Complaint  Patient presents with  . Abdominal Pain  . Emesis    [name] is a 29 y.o. female.  Pt presents to the ED today with n/v and abdominal pain.  Pt has a hx of diabetic gastroparesis and frequent visits to the ED.  Pt was here twice yesterday and was d/c home.  Pt said sx started again after eating applesauce when she arrived home.  Pt denies f/c.  No sob.  No known covid exposures.  Pt said she's been compliant with her insulin pump.        Past Medical History:  Diagnosis Date  . Anxiety   . Arthritis    "hands, feet, knees" (12/18/2016)  . Asthma   . Diabetic gastroparesis ([place])    Per gastric emptying study 07/09/16 which showed significant delayed gastric emptying.  . Gallstones   . Gastroparesis   . GERD (gastroesophageal reflux disease)   . Heart murmur   . Hepatic steatosis 11/26/2014   and hepatomegaly  . Hypertension    hx (12/18/2016)  . Intractable cyclical vomiting syndrome    Archie Endo 12/18/2016  . Liver mass 11/26/2014  . Pancreatitis, acute 11/26/2014  . Pneumonia    "as a teen X 1" (12/18/2016)  . Type I diabetes mellitus ([place]) 2007   IDDM.  poorly controlled, multiple admits with DKA    Patient Active Problem List   Diagnosis Date Noted  . Intractable cyclical vomiting with nausea 02/26/2019  . Gastroparesis due to secondary diabetes ([place]) 11/21/2018  . Diabetes mellitus ([place]) 11/13/2018  . HTN (hypertension) 11/13/2018  . Marijuana abuse 11/13/2018  . Acute pyelonephritis 10/07/2018  . Hydronephrosis of right kidney 10/05/2018  . Diffuse pain   . Elevated blood pressure reading 12/03/2017  . Tachycardia 11/30/2017  . Hypernatremia 11/02/2017  . Nausea and vomiting 01/22/2017  . Nausea & vomiting 01/21/2017  . Intractable cyclical vomiting syndrome 12/18/2016  . Leukocytosis 10/24/2016  .  N&V (nausea and vomiting) 10/23/2016  . DKA, type 1 ([place]) 09/26/2016  . Thrombocytosis ([place]) 07/08/2016  . Candiduria, asymptomatic 06/23/2016  . Hyponatremia 06/13/2016  . Hematuria 05/13/2016  . UTI (urinary tract infection) 01/22/2016  . Diarrhea 11/09/2015  . Acute urinary retention   . Gastroparesis due to DM ([place]) 07/10/2015  . GERD (gastroesophageal reflux disease) 07/10/2015  . Depression with anxiety 07/10/2015  . Gastroparesis 06/22/2015  . Altered mental status 06/22/2015  . Volume depletion 06/10/2015  . Protein-calorie malnutrition, severe 06/10/2015  . Hyperglycemia   . Elevated bilirubin   . Hematemesis with nausea   . Intractable nausea and vomiting 05/28/2015  . Abdominal pain in female   . Abdominal pain 05/24/2015  . Hypertension 05/24/2015  . Dehydration   . Diabetic gastroparesis ([place])   . Chronic diastolic heart failure ([place]) 04/11/2015  . Hematemesis 04/08/2015  . DKA (diabetic ketoacidoses) ([place]) 03/22/2015  . S/P laparoscopic cholecystectomy 02/11/2015  . Postextubation stridor   . Pancreatitis, acute 11/26/2014  . Volume overload 11/26/2014  . Hypokalemia 11/26/2014  . Hepatic steatosis 11/26/2014  . Liver mass 11/26/2014  . Sepsis ([place]) 11/25/2014  . Sinus tachycardia 11/25/2014  . Hypomagnesemia 11/25/2014  . Hypophosphatemia 11/25/2014  . Elevated LFTs 11/24/2014  . AKI (acute kidney injury) ([place]) 11/24/2014  . Migraine headache 11/24/2014  . Asthma 06/29/2012  . Uncontrolled type 1 diabetes mellitus ([place]) 06/19/2010  .  Goiter, unspecified 06/19/2010    Past Surgical History:  Procedure Laterality Date  . CHOLECYSTECTOMY N/A 02/11/2015   Procedure: LAPAROSCOPIC CHOLECYSTECTOMY WITH INTRAOPERATIVE CHOLANGIOGRAM;  Surgeon: Greer Pickerel, MD;  Location: [facility];  Service: General;  Laterality: N/A;  . ESOPHAGOGASTRODUODENOSCOPY (EGD) WITH PROPOFOL Left 09/20/2014   Procedure: ESOPHAGOGASTRODUODENOSCOPY (EGD) WITH PROPOFOL;  Surgeon: Arta Silence, MD;  Location: [facility] ENDOSCOPY;  Service: Endoscopy;  Laterality: Left;  . WISDOM TOOTH EXTRACTION       OB History    Gravida  2   Para  1   Term  0   Preterm  1   AB  1   Living  1     SAB  0   TAB  1   Ectopic  0   Multiple  0   Live Births  1           Family History  Problem Relation Age of Onset  . Heart disease Maternal Grandmother   . Heart disease Maternal Grandfather   . Diabetes Mother   . Hyperlipidemia Mother   . Hypertension Father   . Heart disease Father   . Hypertension Paternal Grandmother   . Cancer Paternal Grandfather     Social History   Tobacco Use  . Smoking status: Never Smoker  . Smokeless tobacco: Never Used  Substance Use Topics  . Alcohol use: No  . Drug use: Not Currently    Types: Marijuana    Home Medications Prior to Admission medications   Medication Sig Start Date End Date Taking? Authorizing Provider  albuterol (PROVENTIL HFA;VENTOLIN HFA) 108 (90 Base) MCG/ACT inhaler Inhale 1-2 puffs into the lungs every 6 (six) hours as needed for wheezing or shortness of breath.    [provider]  blood glucose meter kit and supplies Dispense based on patient and insurance preference. Use up to four times daily as directed. (FOR ICD-10 E10.9, E11.9). 12/07/17   Eugenie Filler, MD  ferrous sulfate 324 (65 Fe) MG TBEC Take 1 tablet by mouth every other day. 02/08/19   [provider]  glucagon (GLUCAGON EMERGENCY) 1 MG injection Inject 1 mg into the muscle once as needed (For very low blood sugars. May repeat in 15-20 minutes if needed.). 12/20/16   Hongalgi, Lenis Dickinson, MD  Insulin Human (INSULIN PUMP) SOLN Inject 0-10 each into the skin continuous. Per sliding scale - Novolog 100 units/ml    [provider]  Insulin Pen Needle 31G X 5 MM MISC 30 Units by Does not apply route at bedtime. 12/07/17   Eugenie Filler, MD  lisinopril (ZESTRIL) 2.5 MG tablet Take 2.5 mg by mouth daily.    [provider]  metoCLOPramide (REGLAN) 10 MG tablet Take 1 tablet (10 mg total) by mouth in the morning and at bedtime. 03/15/19   Varney Biles, MD  pantoprazole (PROTONIX) 40 MG tablet Take 1 tablet (40 mg total) by mouth daily. 03/06/19 04/05/19  Elodia Florence., MD  polyethylene glycol (MIRALAX / GLYCOLAX) 17 g packet Take 17 g by mouth daily for 14 days. Patient taking differently: Take 17 g by mouth daily as needed for mild constipation.  03/06/19 03/20/19  Elodia Florence., MD  promethazine (PHENERGAN) 25 MG suppository Place 1 suppository (25 mg total) rectally every 6 (six) hours as needed for nausea. 03/15/19   Varney Biles, MD  traZODone (DESYREL) 50 MG tablet Take 50 mg by mouth at bedtime.  11/30/18   [provider]    Allergies    Peanut-containing drug products, Food, and Ultram [tramadol]  Review of Systems   Review of Systems  Gastrointestinal: Positive for abdominal pain, nausea and vomiting.  All other systems reviewed and are negative.   Physical Exam Updated Vital Signs BP 124/90 (BP Location: Right Arm)   Pulse (!) 112   Temp 99.3 F (37.4 C)   Resp (!) 22   LMP 02/26/2019   SpO2 100%   Physical Exam Vitals and nursing note reviewed.  Constitutional:      Appearance: She is ill-appearing.  HENT:     Head: Normocephalic and atraumatic.     Mouth/Throat:     Mouth: Mucous membranes are dry.  Eyes:     Extraocular Movements: Extraocular movements intact.     Pupils: Pupils are equal, round, and reactive to light.  Cardiovascular:     Rate and Rhythm: Regular rhythm. Tachycardia present.  Pulmonary:     Effort: Pulmonary effort is normal.     Breath sounds: Normal breath sounds.  Abdominal:     Palpations: Abdomen is soft.     Tenderness: There is generalized abdominal tenderness.  Skin:    General: Skin is warm.     Capillary Refill: Capillary refill takes less than 2 seconds.  Neurological:     General: No focal deficit  present.     Mental Status: She is alert and oriented to person, place, and time.  Psychiatric:        Mood and Affect: Mood is anxious.     ED Results / Procedures / Treatments   Labs (all labs ordered are listed, but only abnormal results are displayed) Labs Reviewed  CBC WITH DIFFERENTIAL/PLATELET - Abnormal; Notable for the following components:      Result Value   Hemoglobin 10.5 (*)    HCT 33.8 (*)    RDW 18.0 (*)    Platelets 476 (*)    Neutro Abs 7.9 (*)    All other components within normal limits  COMPREHENSIVE METABOLIC PANEL - Abnormal; Notable for the following components:   Glucose, Bld 200 (*)    BUN 21 (*)    AST 10 (*)    All other components within normal limits  BLOOD GAS, VENOUS - Abnormal; Notable for the following components:   pH, Ven 7.530 (*)    pCO2, Ven 33.5 (*)    Acid-Base Excess 5.3 (*)    All other components within normal limits  CBG MONITORING, ED - Abnormal; Notable for the following components:   Glucose-Capillary 159 (*)    All other components within normal limits  URINE CULTURE  CULTURE, BLOOD (ROUTINE X 2)  CULTURE, BLOOD (ROUTINE X 2)  LIPASE, BLOOD  URINALYSIS, ROUTINE W REFLEX MICROSCOPIC  MAGNESIUM  LACTIC ACID, PLASMA  LACTIC ACID, PLASMA  RAPID URINE DRUG SCREEN, HOSP PERFORMED  URINALYSIS, ROUTINE W REFLEX MICROSCOPIC  I-STAT BETA HCG BLOOD, ED ([facility], [facility], [facility] ONLY)    EKG EKG Interpretation  Date/Time:  Thursday March 16 2019 09:35:49 EST Ventricular Rate:  131 PR Interval:    QRS Duration: 87 QT Interval:  305 QTC Calculation: 451 R Axis:   95 Text Interpretation: Right and left arm electrode reversal, interpretation assumes no reversal Sinus tachycardia Consider right atrial enlargement Borderline right axis deviation Nonspecific T abnormalities, lateral leads No significant change since last tracing Confirmed by Isla Pence [phone]) on 03/16/2019 9:59:23 AM   Radiology No results  found.  Procedures Procedures (including  critical care time)  Medications Ordered in ED Medications  sodium chloride 0.9 % bolus 1,000 mL (0 mLs Intravenous Stopped 03/16/19 1139)  HYDROmorphone (DILAUDID) injection 1 mg (1 mg Intravenous Given 03/16/19 0954)  promethazine (PHENERGAN) injection 25 mg (25 mg Intravenous Given 03/16/19 0955)  sodium chloride 0.9 % bolus 1,000 mL (1,000 mLs Intravenous New Bag/Given 03/16/19 1143)  ketorolac (TORADOL) 30 MG/ML injection 30 mg (30 mg Intravenous Given 03/16/19 1141)  ondansetron (ZOFRAN) injection 4 mg (4 mg Intravenous Given 03/16/19 1233)  cefTRIAXone (ROCEPHIN) 1 g in sodium chloride 0.9 % 100 mL IVPB (1 g Intravenous New Bag/Given 03/16/19 1231)    ED Course  I have reviewed the triage vital signs and the nursing notes.  Pertinent labs & imaging results that were available during my care of the patient were reviewed by me and considered in my medical decision making (see chart for details).    MDM Rules/Calculators/A&P                      Pt is not in DKA.  She is still tachycardic after 2L and is still feeling nauseous.  She has requested IV pain meds, so this may be a combination of withdrawal and gastroparesis.  However, her urine looks like green pus so that also may be contributing to her n/v.  Pt given 1g rocephin in ED.  Pt has failed outpatient treatment as this is her 3rd time here in 2 days.  Pt d/w Dr. Pietro Cassis (triad) for admission.  Final Clinical Impression(s) / ED Diagnoses Final diagnoses:  Diabetic gastroparesis ([place])  Dehydration  Acute cystitis without hematuria  Neurogenic bladder    Rx / DC Orders ED Discharge Orders    None       Isla Pence, MD 03/16/19 1308

## 2019-03-16 NOTE — ED Notes (Signed)
ED TO INPATIENT HANDOFF REPORT  Name/Age/Gender [name] 29 y.o. female  Code Status Code Status History    Date Active Date Inactive Code Status Order ID Comments User Context   02/26/2019 1858 03/06/2019 1629 Full Code [id number]  Terrilee Croak, MD Inpatient   02/18/2019 1733 02/23/2019 2246 Full Code [id number]  Shelly Coss, MD ED   01/15/2019 0827 01/20/2019 1701 Full Code [id number]  Domenic Polite, MD ED   12/24/2018 1635 12/29/2018 1616 Full Code [id number]  Hosie Poisson, MD Inpatient   11/21/2018 1949 11/23/2018 1726 Full Code [id number]  Neena Rhymes, MD ED   11/13/2018 1912 11/18/2018 2111 Full Code [id number]  Marcell Anger, MD Inpatient   11/07/2018 1647 11/11/2018 1922 Full Code [id number]  Louellen Molder, MD Inpatient   11/04/2018 2355 11/07/2018 1647 Full Code [id number]  Rise Patience, MD Inpatient   10/06/2018 1127 10/16/2018 1441 Full Code [id number]  Jonnie Finner, DO ED   10/04/2018 2002 10/05/2018 1405 Full Code [id number]  Orene Desanctis, DO ED   12/22/2017 1757 12/27/2017 2015 Full Code [id number]  Tawni Millers, MD Inpatient   12/19/2017 1737 12/21/2017 1403 Full Code [id number]  Cristal Deer, MD Inpatient   12/08/2017 1340 12/11/2017 1618 Full Code [id number]  Damita Lack, MD ED   11/29/2017 0635 12/07/2017 1840 Full Code [id number]  Rise Patience, MD Inpatient   11/15/2017 0503 11/21/2017 1747 Full Code [id number]  Rise Patience, MD Inpatient   11/02/2017 1757 11/07/2017 1354 Full Code [id number]  Charlynne Cousins, MD Inpatient   01/21/2017 2304 01/29/2017 1728 Full Code [id number]  Rise Patience, MD ED   12/18/2016 0704 12/20/2016 1727 Full Code [id number]  Rondel Jumbo, PA-C ED   11/05/2016 1525 11/11/2016 1456 Full Code [id number]  Phillips Grout, MD Inpatient   10/23/2016 1342 10/29/2016 2103 Full Code [id number]  Elwin Mocha, MD ED   09/26/2016 1503 09/29/2016 1533 Full Code [id number]  Georgette Shell, MD  ED   07/15/2016 1712 07/18/2016 2035 Full Code [id number]  Damita Lack, MD Inpatient   07/05/2016 1658 07/11/2016 1620 Full Code [id number]  Bonnielee Haff, MD Inpatient   06/23/2016 0856 06/28/2016 1346 Full Code [id number]  Janece Canterbury, MD Inpatient   06/12/2016 2137 06/14/2016 1723 Full Code [id number]  Sid Falcon, MD Inpatient   06/03/2016 2044 06/06/2016 2041 Full Code [id number]  Gardiner Barefoot, NP Inpatient   05/31/2016 2348 06/03/2016 2044 Full Code [id number]  Rise Patience, MD Inpatient   05/14/2016 0212 05/16/2016 1718 Full Code [id number]  Toy Baker, MD Inpatient   05/04/2016 2140 05/10/2016 2004 Full Code [id number]  Orson Eva, MD Inpatient   02/29/2016 2201 03/04/2016 1646 Full Code [id number]  Toy Baker, MD Inpatient   02/20/2016 1441 02/25/2016 1723 Full Code [id number]  Elgergawy, Silver Huguenin, MD Inpatient   01/22/2016 1021 01/25/2016 1607 Full Code [id number]  Orson Eva, MD Inpatient   01/17/2016 0634 01/19/2016 1944 Full Code [id number]  Etta Quill, DO ED   11/18/2015 2238 11/23/2015 1231 Full Code [id number]  Bonnielee Haff, MD Inpatient   11/09/2015 2048 11/12/2015 1535 Full Code [id number]  Toy Baker, MD Inpatient   11/09/2015 2048 11/09/2015 2048 Full Code [id number]  Toy Baker, MD Inpatient   11/03/2015 0238 11/06/2015 1301 Full Code [id number]  Jani Gravel, MD Inpatient   11/02/2015 0128 11/03/2015 0238 Full Code [id number]  Phillips Grout, MD Inpatient   07/10/2015 1645 07/13/2015 1431 Full Code [id number]  Ivor Costa, MD  ED   07/01/2015 2203 07/06/2015 1522 Full Code [id number]  Gennaro Africa, MD ED   06/22/2015 0624 06/24/2015 1531 Full Code [id number]  Etta Quill, DO Inpatient   06/10/2015 0156 06/11/2015 1519 Full Code [id number]  Reubin Milan, MD Inpatient   05/28/2015 1609 06/03/2015 1932 Full Code [id number]  Barton Dubois, MD Inpatient   05/24/2015 2359 05/25/2015 1858 Full Code [id number]  Theressa Millard, MD Inpatient   04/21/2015  0931 04/23/2015 1445 Full Code [id number]  Gennaro Africa, MD ED   04/08/2015 2229 04/15/2015 1605 Full Code [id number]  Reubin Milan, MD ED   03/22/2015 0243 03/25/2015 1811 Full Code [id number]  Rise Patience, MD ED   02/11/2015 1530 02/12/2015 1805 Full Code [id number]  Greer Pickerel, MD Inpatient   11/24/2014 1309 11/29/2014 1929 Full Code [id number]  Rama, Venetia Maxon, MD Inpatient   09/16/2014 0530 09/21/2014 2007 Full Code [id number]  Etta Quill, DO ED   06/29/2012 1400 07/01/2012 1517 Full Code [id number]  Velvet Bathe, MD Inpatient   12/08/2011 1543 12/12/2011 1450 Full Code [id number]  Chyrel Masson, RN Inpatient   Advance Care Planning Activity      Home/SNF/Other Home  Chief Complaint Intractable nausea and vomiting [R11.2]  Level of Care/Admitting Diagnosis ED Disposition    ED Disposition Condition [place] [facility]: [facility] [id number]  Level of Care: Med-Surg [16]  May admit patient to [facility] or [facility] if equivalent level of care is available:: Yes  Covid Evaluation: Asymptomatic Screening Protocol (No Symptoms)  Diagnosis: Intractable nausea and vomiting [id number]  Admitting Physician: Terrilee Croak [id number]  Attending Physician: Terrilee Croak [id number]  Estimated length of stay: past midnight tomorrow  Certification:: I certify this patient will need inpatient services for at least 2 midnights       Medical History Past Medical History:  Diagnosis Date  . Anxiety   . Arthritis    "hands, feet, knees" (12/18/2016)  . Asthma   . Diabetic gastroparesis ([place])    Per gastric emptying study 07/09/16 which showed significant delayed gastric emptying.  . Gallstones   . Gastroparesis   . GERD (gastroesophageal reflux disease)   . Heart murmur   . Hepatic steatosis 11/26/2014   and hepatomegaly  . Hypertension    hx (12/18/2016)  . Intractable cyclical vomiting syndrome    Archie Endo 12/18/2016  . Liver mass 11/26/2014  .  Pancreatitis, acute 11/26/2014  . Pneumonia    "as a teen X 1" (12/18/2016)  . Type I diabetes mellitus ([place]) 2007   IDDM.  poorly controlled, multiple admits with DKA    Allergies Allergies  Allergen Reactions  . Peanut-Containing Drug Products Swelling and Other (See Comments)    Reaction:  Swelling of mouth and lips   . 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   Reaction:  Swelling of mouth and lips   . Ultram [Tramadol] Itching    IV Location/Drains/Wounds Patient Lines/Drains/Airways Status   Active Line/Drains/Airways    Name:   Placement date:   Placement time:   Site:   Days:   Implanted Port 01/20/19   01/20/19    1337    --   55          Labs/Imaging Results for orders placed or performed during the hospital encounter of 03/16/19 (from the past 48 hour(s))  POC CBG, ED     Status: Abnormal  Collection Time: 03/16/19  9:41 AM  Result Value Ref Range   Glucose-Capillary 159 (H) 70 - 99 mg/dL    Comment: Glucose reference range applies only to samples taken after fasting for at least 8 hours.  CBC with Differential     Status: Abnormal   Collection Time: 03/16/19  9:59 AM  Result Value Ref Range   WBC 10.4 4.0 - 10.5 K/uL   RBC 3.95 3.87 - 5.11 MIL/uL   Hemoglobin 10.5 (L) 12.0 - 15.0 g/dL   HCT 33.8 (L) 36.0 - 46.0 %   MCV 85.6 80.0 - 100.0 fL   MCH 26.6 26.0 - 34.0 pg   MCHC 31.1 30.0 - 36.0 g/dL   RDW 18.0 (H) 11.5 - 15.5 %   Platelets 476 (H) 150 - 400 K/uL   nRBC 0.0 0.0 - 0.2 %   Neutrophils Relative % 77 %   Neutro Abs 7.9 (H) 1.7 - 7.7 K/uL   Lymphocytes Relative 14 %   Lymphs Abs 1.5 0.7 - 4.0 K/uL   Monocytes Relative 8 %   Monocytes Absolute 0.8 0.1 - 1.0 K/uL   Eosinophils Relative 1 %   Eosinophils Absolute 0.1 0.0 - 0.5 K/uL   Basophils Relative 0 %   Basophils Absolute 0.0 0.0 - 0.1 K/uL   Immature Granulocytes 0 %   Abs Immature Granulocytes 0.02 0.00 - 0.07 K/uL    Comment: Performed at [facility], [place] [address] [place], [place] [address]  Comprehensive metabolic panel     Status: Abnormal   Collection Time: 03/16/19  9:59 AM  Result Value Ref Range   Sodium 137 135 - 145 mmol/L   Potassium 3.6 3.5 - 5.1 mmol/L   Chloride 99 98 - 111 mmol/L   CO2 25 22 - 32 mmol/L   Glucose, Bld 200 (H) 70 - 99 mg/dL    Comment: Glucose reference range applies only to samples taken after fasting for at least 8 hours.   BUN 21 (H) 6 - 20 mg/dL   Creatinine, Ser 0.99 0.44 - 1.00 mg/dL   Calcium 9.5 8.9 - 10.3 mg/dL   Total Protein 7.4 6.5 - 8.1 g/dL   Albumin 3.7 3.5 - 5.0 g/dL   AST 10 (L) 15 - 41 U/L   ALT 8 0 - 44 U/L   Alkaline Phosphatase 80 38 - 126 U/L   Total Bilirubin 1.1 0.3 - 1.2 mg/dL   GFR calc non Af Amer >60 >60 mL/min   GFR calc Af Amer >60 >60 mL/min   Anion gap 13 5 - 15    Comment: Performed at [facility], [place] [address], [place], [place] [address]  Lipase, blood     Status: None   Collection Time: 03/16/19  9:59 AM  Result Value Ref Range   Lipase 14 11 - 51 U/L    Comment: Performed at [facility], [place] [address] [place], [place] [address]  Magnesium     Status: None   Collection Time: 03/16/19  9:59 AM  Result Value Ref Range   Magnesium 1.9 1.7 - 2.4 mg/dL    Comment: Performed at [facility], [place] [address], [place],  [address]  Blood gas, venous (at [facility] and [facility], not at [facility])     Status: Abnormal   Collection Time: 03/16/19  9:59 AM  Result Value Ref Range   pH, Ven 7.530 (H) 7.250 - 7.430   pCO2, Ven 33.5 (  L) 44.0 - 60.0 mmHg   pO2, Ven 34.8 32.0 - 45.0 mmHg   Bicarbonate 27.9 20.0 - 28.0 mmol/L   Acid-Base Excess 5.3 (H) 0.0 - 2.0 mmol/L   O2 Saturation 68.4 %   Patient temperature 37.0     Comment: Performed at [facility], [place] [address], [place], [place] [address]  I-Stat Beta hCG blood, ED ([facility], [facility], [facility] only)     Status: None   Collection Time:  03/16/19 10:05 AM  Result Value Ref Range   I-stat hCG, quantitative <5.0 <5 mIU/mL   Comment 3            Comment:   GEST. AGE      CONC.  (mIU/mL)   <=1 WEEK        5 - 50     2 WEEKS       50 - 500     3 WEEKS       100 - 10,000     4 WEEKS     1,000 - 30,000        FEMALE AND NON-PREGNANT FEMALE:     LESS THAN 5 mIU/mL   Rapid urine drug screen (hospital performed)     Status: Abnormal   Collection Time: 03/16/19 11:40 AM  Result Value Ref Range   Opiates POSITIVE (A) NONE DETECTED   Cocaine NONE DETECTED NONE DETECTED   Benzodiazepines NONE DETECTED NONE DETECTED   Amphetamines NONE DETECTED NONE DETECTED   Tetrahydrocannabinol NONE DETECTED NONE DETECTED   Barbiturates NONE DETECTED NONE DETECTED    Comment: (NOTE) DRUG SCREEN FOR MEDICAL PURPOSES ONLY.  IF CONFIRMATION IS NEEDED FOR ANY PURPOSE, NOTIFY LAB WITHIN 5 DAYS. LOWEST DETECTABLE LIMITS FOR URINE DRUG SCREEN Drug Class                     Cutoff (ng/mL) Amphetamine and metabolites    1000 Barbiturate and metabolites    200 Benzodiazepine                 [id number] Tricyclics and metabolites     300 Opiates and metabolites        300 Cocaine and metabolites        300 THC                            50 Performed at [facility], [place] [address] [place], [place] [address]   Urinalysis, Routine w reflex microscopic     Status: Abnormal   Collection Time: 03/16/19 11:40 AM  Result Value Ref Range   Color, Urine YELLOW YELLOW   APPearance TURBID (A) CLEAR   Specific Gravity, Urine 1.016 1.005 - 1.030   pH 5.0 5.0 - 8.0   Glucose, UA 50 (A) NEGATIVE mg/dL   Hgb urine dipstick SMALL (A) NEGATIVE   Bilirubin Urine NEGATIVE NEGATIVE   Ketones, ur 20 (A) NEGATIVE mg/dL   Protein, ur 100 (A) NEGATIVE mg/dL   Nitrite NEGATIVE NEGATIVE   Leukocytes,Ua SMALL (A) NEGATIVE   RBC / HPF >50 (H) 0 - 5 RBC/hpf   WBC, UA >50 (H) 0 - 5 WBC/hpf   Bacteria, UA MANY (A) NONE SEEN   WBC Clumps PRESENT      Comment: Performed at [facility], [place] [address], [place], [place] [address]  Urinalysis, Routine w reflex microscopic     Status: None   Collection Time:  03/16/19 11:43 AM  Result Value Ref Range   Color, Urine DUPLICATE REQUEST YELLOW    Comment: SEE ACC [id number] CORRECTED ON 02/25 AT 1302: PREVIOUSLY REPORTED AS YELLOW    APPearance DUPLICATE REQUEST CLEAR    Comment: SEE ACC [id number] CORRECTED ON 02/25 AT 1302: PREVIOUSLY REPORTED AS TURBID    Specific Gravity, Urine DUPLICATE REQUEST [id number] - 1.030    Comment: SEE ACC [id number] CORRECTED ON 02/25 AT 1302: PREVIOUSLY REPORTED AS [id number]    pH DUPLICATE REQUEST 5.0 - 8.0    Comment: SEE ACC [id number] CORRECTED ON 02/25 AT 1302: PREVIOUSLY REPORTED AS 6.0    Glucose, UA DUPLICATE REQUEST NEGATIVE mg/dL    Comment: SEE ACC [id number] CORRECTED ON 02/25 AT 1302: PREVIOUSLY REPORTED AS 50    Hgb urine dipstick DUPLICATE REQUEST NEGATIVE    Comment: SEE ACC [id number] CORRECTED ON 02/25 AT 1302: PREVIOUSLY REPORTED AS SMALL    Bilirubin Urine DUPLICATE REQUEST NEGATIVE    Comment: SEE ACC [id number] CORRECTED ON 02/25 AT 1302: PREVIOUSLY REPORTED AS NEGATIVE    Ketones, ur DUPLICATE REQUEST NEGATIVE mg/dL    Comment: SEE ACC [id number] CORRECTED ON 02/25 AT 1302: PREVIOUSLY REPORTED AS 20    Protein, ur DUPLICATE REQUEST NEGATIVE mg/dL    Comment: SEE ACC [id number] CORRECTED ON 02/25 AT 1302: PREVIOUSLY REPORTED AS [id number]    Nitrite DUPLICATE REQUEST NEGATIVE    Comment: SEE ACC [id number] CORRECTED ON 02/25 AT 1302: PREVIOUSLY REPORTED AS NEGATIVE    Leukocytes,Ua DUPLICATE REQUEST NEGATIVE    Comment: SEE ACC [id number] CORRECTED ON 02/25 AT 1302: PREVIOUSLY REPORTED AS SMALL    Urine-Other DUPLICATE REQUEST     Comment: SEE [place] [id number] Performed at [facility], [place] [address] [place], [place] [address]   Lactic acid, plasma     Status: None   Collection Time: 03/16/19 11:49 AM  Result Value Ref Range   Lactic  Acid, Venous 0.6 0.5 - 1.9 mmol/L    Comment: Performed at [facility], [place] [address] [place], [place] [address]  Culture, blood (routine x 2)     Status: None (Preliminary result)   Collection Time: 03/16/19 12:30 PM   Specimen: BLOOD  Result Value Ref Range   Specimen Description BLOOD RIGHT CHEST PORT    Special Requests      BOTTLES DRAWN AEROBIC AND ANAEROBIC Blood Culture results may not be optimal due to an inadequate volume of blood received in culture bottles Performed at [facility], [place] [address], [place], [place] [address]    Culture NO GROWTH <12 HOURS    Report Status PENDING   CBG monitoring, ED     Status: Abnormal   Collection Time: 03/16/19  3:02 PM  Result Value Ref Range   Glucose-Capillary 173 (H) 70 - 99 mg/dL    Comment: Glucose reference range applies only to samples taken after fasting for at least 8 hours.   No results found.  Pending Labs [employer] (From admission, onward)    Start     Ordered   03/16/19 1114  Urine culture  ONCE - STAT,   STAT     03/16/19 1113   Signed and Held  Basic metabolic panel  Daily,   R     Signed and Held   Signed and Held  CBC  Daily,   R     Signed and Held   Signed and Held  Magnesium  Add-on,   R  Signed and Held   Signed and Held  Phosphorus  Add-on,   R     Signed and Held          Vitals/Pain Today's Vitals   03/16/19 1230 03/16/19 1530 03/16/19 1600 03/16/19 1607  BP: 115/81 118/90 107/76   Pulse: (!) 111 (!) 112 (!) 121 (!) 105  Resp: 13 17 17 17   Temp:      SpO2: 99% 97% 100% 98%  PainSc:        Isolation Precautions No active isolations  Medications Medications  metoCLOPramide (REGLAN) injection 10 mg (has no administration in time range)  ferrous sulfate tablet 325 mg (has no administration in time range)  insulin glargine (LANTUS) injection 10 Units (has no administration in time range)  insulin aspart (novoLOG) injection 0-9 Units (has no  administration in time range)  insulin aspart (novoLOG) injection 0-5 Units (has no administration in time range)  sodium chloride 0.9 % bolus 1,000 mL (0 mLs Intravenous Stopped 03/16/19 1139)  HYDROmorphone (DILAUDID) injection 1 mg (1 mg Intravenous Given 03/16/19 0954)  promethazine (PHENERGAN) injection 25 mg (25 mg Intravenous Given 03/16/19 0955)  sodium chloride 0.9 % bolus 1,000 mL (0 mLs Intravenous Stopped 03/16/19 1433)  ketorolac (TORADOL) 30 MG/ML injection 30 mg (30 mg Intravenous Given 03/16/19 1141)  ondansetron (ZOFRAN) injection 4 mg (4 mg Intravenous Given 03/16/19 1233)  cefTRIAXone (ROCEPHIN) 1 g in sodium chloride 0.9 % 100 mL IVPB (0 g Intravenous Stopped 03/16/19 1433)  dextrose 50 % solution 50 mL (50 mLs Intravenous Given 03/16/19 1430)    Mobility walks

## 2019-03-16 NOTE — ED Notes (Signed)
Main phlebotomy called to draw second set of cultures.

## 2019-03-16 NOTE — ED Provider Notes (Signed)
[facility] DEPT Provider Note   CSN: [id number] Arrival date & time: 03/15/19  0732     History Chief Complaint  Patient presents with  . Abdominal Pain  . Emesis    [name] is a 29 y.o. female.  HPI     29 year old female with history of gastroparesis comes in with chief complaint of nausea, vomiting.  She also has history of pancreatitis, type 1 diabetes.  Patient was just seen in the ED earlier this morning.  She reports that she felt okay and went home, however soon after she started having vomiting and abdominal pain again.  She reports about 5+ episodes of emesis.  The emesis is mostly small-volume and clear to green in color.  No blood.  She denies any diarrhea.  Patient is having epigastric pain similar to her gastroparesis pain.   Past Medical History:  Diagnosis Date  . Anxiety   . Arthritis    "hands, feet, knees" (12/18/2016)  . Asthma   . Diabetic gastroparesis ([place])    Per gastric emptying study 07/09/16 which showed significant delayed gastric emptying.  . Gallstones   . Gastroparesis   . GERD (gastroesophageal reflux disease)   . Heart murmur   . Hepatic steatosis 11/26/2014   and hepatomegaly  . Hypertension    hx (12/18/2016)  . Intractable cyclical vomiting syndrome    Archie Endo 12/18/2016  . Liver mass 11/26/2014  . Pancreatitis, acute 11/26/2014  . Pneumonia    "as a teen X 1" (12/18/2016)  . Type I diabetes mellitus ([place]) 2007   IDDM.  poorly controlled, multiple admits with DKA    Patient Active Problem List   Diagnosis Date Noted  . Intractable cyclical vomiting with nausea 02/26/2019  . Gastroparesis due to secondary diabetes ([place]) 11/21/2018  . Diabetes mellitus ([place]) 11/13/2018  . HTN (hypertension) 11/13/2018  . Marijuana abuse 11/13/2018  . Acute pyelonephritis 10/07/2018  . Hydronephrosis of right kidney 10/05/2018  . Diffuse pain   . Elevated blood pressure reading 12/03/2017  . Tachycardia  11/30/2017  . Hypernatremia 11/02/2017  . Nausea and vomiting 01/22/2017  . Nausea & vomiting 01/21/2017  . Intractable cyclical vomiting syndrome 12/18/2016  . Leukocytosis 10/24/2016  . N&V (nausea and vomiting) 10/23/2016  . DKA, type 1 ([place]) 09/26/2016  . Thrombocytosis ([place]) 07/08/2016  . Candiduria, asymptomatic 06/23/2016  . Hyponatremia 06/13/2016  . Hematuria 05/13/2016  . UTI (urinary tract infection) 01/22/2016  . Diarrhea 11/09/2015  . Acute urinary retention   . Gastroparesis due to DM ([place]) 07/10/2015  . GERD (gastroesophageal reflux disease) 07/10/2015  . Depression with anxiety 07/10/2015  . Gastroparesis 06/22/2015  . Altered mental status 06/22/2015  . Volume depletion 06/10/2015  . Protein-calorie malnutrition, severe 06/10/2015  . Hyperglycemia   . Elevated bilirubin   . Hematemesis with nausea   . Intractable nausea and vomiting 05/28/2015  . Abdominal pain in female   . Abdominal pain 05/24/2015  . Hypertension 05/24/2015  . Dehydration   . Diabetic gastroparesis ([place])   . Chronic diastolic heart failure ([place]) 04/11/2015  . Hematemesis 04/08/2015  . DKA (diabetic ketoacidoses) ([place]) 03/22/2015  . S/P laparoscopic cholecystectomy 02/11/2015  . Postextubation stridor   . Pancreatitis, acute 11/26/2014  . Volume overload 11/26/2014  . Hypokalemia 11/26/2014  . Hepatic steatosis 11/26/2014  . Liver mass 11/26/2014  . Sepsis ([place]) 11/25/2014  . Sinus tachycardia 11/25/2014  . Hypomagnesemia 11/25/2014  . Hypophosphatemia 11/25/2014  . Elevated LFTs 11/24/2014  . AKI (  acute kidney injury) ([place]) 11/24/2014  . Migraine headache 11/24/2014  . Asthma 06/29/2012  . Uncontrolled type 1 diabetes mellitus ([place]) 06/19/2010  . Goiter, unspecified 06/19/2010    Past Surgical History:  Procedure Laterality Date  . CHOLECYSTECTOMY N/A 02/11/2015   Procedure: LAPAROSCOPIC CHOLECYSTECTOMY WITH INTRAOPERATIVE CHOLANGIOGRAM;  Surgeon: Greer Pickerel, MD;  Location:  [facility] ORS;  Service: General;  Laterality: N/A;  . ESOPHAGOGASTRODUODENOSCOPY (EGD) WITH PROPOFOL Left 09/20/2014   Procedure: ESOPHAGOGASTRODUODENOSCOPY (EGD) WITH PROPOFOL;  Surgeon: Arta Silence, MD;  Location: [facility] ENDOSCOPY;  Service: Endoscopy;  Laterality: Left;  . WISDOM TOOTH EXTRACTION       OB History    Gravida  2   Para  1   Term  0   Preterm  1   AB  1   Living  1     SAB  0   TAB  1   Ectopic  0   Multiple  0   Live Births  1           Family History  Problem Relation Age of Onset  . Heart disease Maternal Grandmother   . Heart disease Maternal Grandfather   . Diabetes Mother   . Hyperlipidemia Mother   . Hypertension Father   . Heart disease Father   . Hypertension Paternal Grandmother   . Cancer Paternal Grandfather     Social History   Tobacco Use  . Smoking status: Never Smoker  . Smokeless tobacco: Never Used  Substance Use Topics  . Alcohol use: No  . Drug use: Not Currently    Types: Marijuana    Home Medications Prior to Admission medications   Medication Sig Start Date End Date Taking? Authorizing Provider  albuterol (PROVENTIL HFA;VENTOLIN HFA) 108 (90 Base) MCG/ACT inhaler Inhale 1-2 puffs into the lungs every 6 (six) hours as needed for wheezing or shortness of breath.   Yes [provider]  ferrous sulfate 324 (65 Fe) MG TBEC Take 1 tablet by mouth every other day. 02/08/19  Yes [provider]  Insulin Human (INSULIN PUMP) SOLN Inject 0-10 each into the skin continuous. Per sliding scale - Novolog 100 units/ml   Yes [provider]  lisinopril (ZESTRIL) 2.5 MG tablet Take 2.5 mg by mouth daily.   Yes [provider]  pantoprazole (PROTONIX) 40 MG tablet Take 1 tablet (40 mg total) by mouth daily. 03/06/19 04/05/19 Yes Elodia Florence., MD  polyethylene glycol (MIRALAX / GLYCOLAX) 17 g packet Take 17 g by mouth daily for 14 days. Patient taking differently: Take 17 g by mouth daily as  needed for mild constipation.  03/06/19 03/20/19 Yes Elodia Florence., MD  traZODone (DESYREL) 50 MG tablet Take 50 mg by mouth at bedtime.  11/30/18  Yes [provider]  blood glucose meter kit and supplies Dispense based on patient and insurance preference. Use up to four times daily as directed. (FOR ICD-10 E10.9, E11.9). 12/07/17   Eugenie Filler, MD  glucagon (GLUCAGON EMERGENCY) 1 MG injection Inject 1 mg into the muscle once as needed (For very low blood sugars. May repeat in 15-20 minutes if needed.). 12/20/16   Hongalgi, Lenis Dickinson, MD  Insulin Pen Needle 31G X 5 MM MISC 30 Units by Does not apply route at bedtime. 12/07/17   Eugenie Filler, MD  metoCLOPramide (REGLAN) 10 MG tablet Take 1 tablet (10 mg total) by mouth in the morning and at bedtime. 03/15/19   Varney Biles, MD  promethazine (PHENERGAN) 25 MG suppository Place 1 suppository (25 mg total) rectally every 6 (six) hours as needed for nausea. 03/15/19   Varney Biles, MD    Allergies    Peanut-containing drug products, Food, and Ultram [tramadol]  Review of Systems   Review of Systems  Constitutional: Positive for activity change.  Gastrointestinal: Positive for abdominal pain, nausea and vomiting.  Neurological: Negative for syncope and light-headedness.  Hematological: Does not bruise/bleed easily.  All other systems reviewed and are negative.   Physical Exam Updated Vital Signs BP 124/83 (BP Location: Right Arm)   Pulse 98   Temp 98.1 F (36.7 C) (Oral)   Resp 18   LMP 02/26/2019   SpO2 98%   Physical Exam Vitals and nursing note reviewed.  Constitutional:      General: She is in acute distress.     Appearance: She is well-developed.     Comments: Patient is appearing distressed due to pain  HENT:     Head: Normocephalic and atraumatic.  Cardiovascular:     Rate and Rhythm: Normal rate.  Pulmonary:     Effort: Pulmonary effort is normal.  Abdominal:     General: Bowel sounds are  normal.     Tenderness: There is abdominal tenderness in the right upper quadrant, epigastric area and left upper quadrant. There is no guarding or rebound.  Musculoskeletal:     Cervical back: Normal range of motion and neck supple.  Skin:    General: Skin is warm and dry.  Neurological:     Mental Status: She is alert and oriented to person, place, and time.     ED Results / Procedures / Treatments   Labs (all labs ordered are listed, but only abnormal results are displayed) Labs Reviewed  CBG MONITORING, ED - Abnormal; Notable for the following components:      Result Value   Glucose-Capillary 24 (*)    All other components within normal limits  CBG MONITORING, ED - Abnormal; Notable for the following components:   Glucose-Capillary 257 (*)    All other components within normal limits  CBG MONITORING, ED - Abnormal; Notable for the following components:   Glucose-Capillary 289 (*)    All other components within normal limits    EKG None  Radiology No results found.  Procedures .Critical Care Performed by: Varney Biles, MD Authorized by: Varney Biles, MD   Critical care provider statement:    Critical care time (minutes):  36   Critical care was time spent personally by me on the following activities:  Discussions with consultants, evaluation of patient's response to treatment, examination of patient, ordering and performing treatments and interventions, ordering and review of laboratory studies, ordering and review of radiographic studies, pulse oximetry, re-evaluation of patient's condition, obtaining history from patient or surrogate and review of old charts   (including critical care time)  Medications Ordered in ED Medications  droperidol (INAPSINE) 2.5 MG/ML injection 2.5 mg (2.5 mg Intramuscular Given 03/15/19 0835)  HYDROmorphone (DILAUDID) injection 1 mg (1 mg Intramuscular Given 03/15/19 0837)  promethazine (PHENERGAN) tablet 50 mg (50 mg Oral Given  03/15/19 0834)  dextrose 50 % solution 50 mL (50 mLs Intravenous Given 03/15/19 1015)  dextrose (GLUTOSE) 40 % oral gel 37.5 g (37.5 g Oral Given 03/15/19 1005)  ketorolac (TORADOL) 15 MG/ML injection 15 mg (15 mg Intravenous Given 03/15/19 1113)  oxyCODONE-acetaminophen (PERCOCET/ROXICET) 5-325 MG per tablet 1 tablet (1 tablet Oral Given 03/15/19 1111)  metoCLOPramide (REGLAN) tablet 20  mg (20 mg Oral Given 03/15/19 1112)  heparin lock flush 100 unit/mL (500 Units Intracatheter Given 03/15/19 1243)    ED Course  I have reviewed the triage vital signs and the nursing notes.  Pertinent labs & imaging results that were available during my care of the patient were reviewed by me and considered in my medical decision making (see chart for details).  Clinical Course as of Mar 15 1537  Wed Mar 15, 2019  1000 Patient reassessed.  She looks more comfortable.  She is still requesting pain meds.  We will check her CBG.  If she is doing well then she will be discharged.   [AN]  1030 Patient's blood glucose came back at 24.  She has insulin pump on.  It appears that with the reduced p.o. intake in the setting of continuous insulin infusion led to the hypoglycemia.  We will put an IV and reassess,   [AN]    Clinical Course User Index [AN] Varney Biles, MD   MDM Rules/Calculators/A&P                      29 year old comes in a chief complaint of abdominal pain. She has history of type 1 diabetes and gastroparesis.  On exam she has epigastric tenderness without rebound or guarding.  Patient was just seen in the ER few hours back and had normal-appearing labs.  I do not think repeat labs are indicated.  Hemodynamically she is not showing any tachycardia, hypotension.  We will try to control her pain.    Final Clinical Impression(s) / ED Diagnoses Final diagnoses:  Gastroparesis    Rx / DC Orders ED Discharge Orders         Ordered    metoCLOPramide (REGLAN) 10 MG tablet  2 times daily      03/15/19 1227    promethazine (PHENERGAN) 25 MG suppository  Every 6 hours PRN     03/15/19 1227           Varney Biles, MD 03/16/19 1545

## 2019-03-16 NOTE — ED Triage Notes (Signed)
Pt reports that last night her stomach started hurting again with n/v.

## 2019-03-16 NOTE — ED Notes (Signed)
Verbal order from MD to start antibiotics after first cultures were drawn.

## 2019-03-16 NOTE — Progress Notes (Signed)
Received report from [facility] at [phone]. Will bring pt to the floor after COVID test is done.

## 2019-03-16 NOTE — ED Notes (Signed)
Pt sitting on edge of bed eating meal tray

## 2019-03-16 NOTE — H&P (Signed)
[facility] History and Physical  [name] RKY:[id number] DOB: [birth year] DOA: 03/16/2019 PCP: Vicenta Aly, FNP  [name] coming from: Home Chief Complaint: Intractable nausea, vomiting  History of Present Illness: [name] is a 29 year old female with PMH significant for T1DM on insulin pump, recurrent DKA, gastroparesis, intractable cyclic vomiting, multiple hospitalizations for the same, HTN, GERD, asthma. [name] presented to the ED with complaint of intractable nausea, vomiting. [name] was admitted recently to the hospital 2/7-2/15 for similar complaint.  She has a diagnosed history of diabetic gastroparesis because of which he has recurrent nausea, vomiting and abdominal pain leading to dehydration, DKA at times and hospital stay.  Over time, [name] seems to have grown tolerance to opioid.  On chart review, it is noted that [name] presented multiple times in the ED here at [facility] as well as [facility] and [facility] and prefers IV pain medicines. Gastric emptying study6/21/2018 with significantly delayed gastric emptying i. 2% emptied at 1 hr ( normal >= 10%) ii. 16% emptied at 2 hr ( normal >= 40%) iii. 20% emptied at 3 hr ( normal >= 70%) iv. 24% emptied at 4 hr ( normal >= 90%)  [name] presented yesterday to the ED for similar complaint.  Symptomatic control was achieved and she was discharged home. [name] returned back to the ED today with worsening nausea and vomiting. In the ED, [name] was afebrile, tachycardic to 125, blood pressure 125/106.  Breathing comfortably on room air. Blood gas with pH elevated to 7.53, PCO2 elevated to 33. Sodium 137, potassium 3.6, BUN/creatinine 21/0.99, liver enzymes normal, lactic acid normal, WBC count normal at 10.4.  Serum glucose level 200. Urinalysis showed turbid yellow urine with many bacteria more than 50 WBCs.  [name] has been asking for IV pain medicine as well as IV Phenergan in the ED. At the time of my  evaluation, [name] was sleeping with her face covered under blanket.  Opens eyes on verbal command, able to answer simple questions.  Prefers to fall back to sleep.  Not in acute distress at this time.   Review of Systems:  All systems were reviewed and were negative unless otherwise mentioned in the HPI   Past medical history: Past Medical History:  Diagnosis Date  . Anxiety   . Arthritis    "hands, feet, knees" (12/18/2016)  . Asthma   . Diabetic gastroparesis ([place])    Per gastric emptying study 07/09/16 which showed significant delayed gastric emptying.  . Gallstones   . Gastroparesis   . GERD (gastroesophageal reflux disease)   . Heart murmur   . Hepatic steatosis 11/26/2014   and hepatomegaly  . Hypertension    hx (12/18/2016)  . Intractable cyclical vomiting syndrome    Archie Endo 12/18/2016  . Liver mass 11/26/2014  . Pancreatitis, acute 11/26/2014  . Pneumonia    "as a teen X 1" (12/18/2016)  . Type I diabetes mellitus ([place]) 2007   IDDM.  poorly controlled, multiple admits with DKA    Past surgical history: Past Surgical History:  Procedure Laterality Date  . CHOLECYSTECTOMY N/A 02/11/2015   Procedure: LAPAROSCOPIC CHOLECYSTECTOMY WITH INTRAOPERATIVE CHOLANGIOGRAM;  Surgeon: Greer Pickerel, MD;  Location: [facility];  Service: General;  Laterality: N/A;  . ESOPHAGOGASTRODUODENOSCOPY (EGD) WITH PROPOFOL Left 09/20/2014   Procedure: ESOPHAGOGASTRODUODENOSCOPY (EGD) WITH PROPOFOL;  Surgeon: Arta Silence, MD;  Location: [facility] END[facility]COPY;  Service: Endoscopy;  Laterality: Left;  . WISDOM TOOTH EXTRACTION      Social History:  reports that she has never smoked. She  has never used smokeless tobacco. She reports previous drug use. Drug: Marijuana. She reports that she does not drink alcohol.  Allergies:  Allergies  Allergen Reactions  . Peanut-Containing Drug Products Swelling and Other (See Comments)    Reaction:  Swelling of mouth and lips   . Food Swelling and Other (See  Comments)    Pt is allergic to strawberries.   Reaction:  Swelling of mouth and lips   . Ultram [Tramadol] Itching    Family history:  Family History  Problem Relation Age of Onset  . Heart disease Maternal Grandmother   . Heart disease Maternal Grandfather   . Diabetes Mother   . Hyperlipidemia Mother   . Hypertension Father   . Heart disease Father   . Hypertension Paternal Grandmother   . Cancer Paternal Grandfather      Home Meds: Prior to Admission medications   Medication Sig Start Date End Date Taking? Authorizing Provider  albuterol (PROVENTIL HFA;VENTOLIN HFA) 108 (90 Base) MCG/ACT inhaler Inhale 1-2 puffs into the lungs every 6 (six) hours as needed for wheezing or shortness of breath.    [provider]  blood glucose meter kit and supplies Dispense based on [name] and insurance preference. Use up to four times daily as directed. (FOR ICD-10 E10.9, E11.9). 12/07/17   Eugenie Filler, MD  ferrous sulfate 324 (65 Fe) MG TBEC Take 1 tablet by mouth every other day. 02/08/19   [provider]  glucagon (GLUCAGON EMERGENCY) 1 MG injection Inject 1 mg into the muscle once as needed (For very low blood sugars. May repeat in 15-20 minutes if needed.). 12/20/16   Hongalgi, Lenis Dickinson, MD  Insulin Human (INSULIN PUMP) SOLN Inject 0-10 each into the skin continuous. Per sliding scale - Novolog 100 units/ml    [provider]  Insulin Pen Needle 31G X 5 MM MISC 30 Units by Does not apply route at bedtime. 12/07/17   Eugenie Filler, MD  lisinopril (ZESTRIL) 2.5 MG tablet Take 2.5 mg by mouth daily.    [provider]  metoCLOPramide (REGLAN) 10 MG tablet Take 1 tablet (10 mg total) by mouth in the morning and at bedtime. 03/15/19   Varney Biles, MD  pantoprazole (PROTONIX) 40 MG tablet Take 1 tablet (40 mg total) by mouth daily. 03/06/19 04/05/19  Elodia Florence., MD  polyethylene glycol (MIRALAX / GLYCOLAX) 17 g packet Take 17 g by mouth  daily for 14 days. [name] taking differently: Take 17 g by mouth daily as needed for mild constipation.  03/06/19 03/20/19  Elodia Florence., MD  promethazine (PHENERGAN) 25 MG suppository Place 1 suppository (25 mg total) rectally every 6 (six) hours as needed for nausea. 03/15/19   Varney Biles, MD  traZODone (DESYREL) 50 MG tablet Take 50 mg by mouth at bedtime.  11/30/18   [provider]    Physical Exam: Vitals:   03/16/19 0923 03/16/19 1132 03/16/19 1230  BP: (!) 125/106 124/90 115/81  Pulse: (!) 125 (!) 112 (!) 111  Resp: 17 (!) 22 13  Temp: 99.3 F (37.4 C)    SpO2: 100% 100% 99%   Wt Readings from Last 3 Encounters:  02/26/19 54.4 kg  02/24/19 54.4 kg  02/20/19 54.4 kg   There is no height or weight on file to calculate BMI.  General exam: Appears calm and comfortable at the time of my evaluation Skin: No rashes, lesions or ulcers. HEENT: Atraumatic, normocephalic, supple neck, no obvious bleeding  Lungs: Clear to auscultation bilaterally CVS: Regular rate and rhythm, no murmur GI/Abd soft, mild to moderate epigastric tenderness, nondistended, bowel sound present CNS: Alert, awake, oriented x3 Psychiatry: Mood appropriate Extremities: No pedal edema, no calf tenderness     Consult Orders  (From admission, onward)         Start     Ordered   03/16/19 1232  Consult to hospitalist  ALL PATIENTS BEING ADMITTED/HAVING PROCEDURES NEED COVID-19 SCREENING  Once    Comments: ALL PATIENTS BEING ADMITTED/HAVING PROCEDURES NEED COVID-19 SCREENING  Provider:  (Not yet assigned)  Question Answer Comment  Place call to: [facility]   Reason for Consult Admit      03/16/19 1232          Labs on Admission:   CBC: Recent Labs  Lab 03/15/19 0122 03/16/19 0959  WBC 10.6* 10.4  NEUTROABS 7.8* 7.9*  HGB 10.4* 10.5*  HCT 33.3* 33.8*  MCV 86.3 85.6  PLT 482* 476*    Basic Metabolic Panel: Recent Labs  Lab 03/15/19 0122 03/16/19 0959  NA  136 137  K 3.6 3.6  CL 99 99  CO2 24 25  GLUCOSE 300* 200*  BUN 25* 21*  CREATININE 0.91 0.99  CALCIUM 9.4 9.5  MG  --  1.9    Liver Function Tests: Recent Labs  Lab 03/15/19 0122 03/16/19 0959  AST 13* 10*  ALT 8 8  ALKPHOS 75 80  BILITOT 0.8 1.1  PROT 7.6 7.4  ALBUMIN 4.0 3.7   Recent Labs  Lab 03/15/19 0122 03/16/19 0959  LIPASE 14 14   No results for input(s): AMMONIA in the last 168 hours.  Cardiac Enzymes: No results for input(s): CKTOTAL, CKMB, CKMBINDEX, TROPONINI in the last 168 hours.  BNP (last 3 results) No results for input(s): BNP in the last 8760 hours.  ProBNP (last 3 results) No results for input(s): PROBNP in the last 8760 hours.  CBG: Recent Labs  Lab 03/14/19 2358 03/15/19 0950 03/15/19 1023 03/15/19 1120 03/16/19 0941  GLUCAP 229* 24* 257* 289* 159*    Lipase     Component Value Date/Time   LIPASE 14 03/16/2019 0959     Urinalysis    Component Value Date/Time   COLORURINE DUPLICATE REQUEST 84/69/6295 1143   APPEARANCEUR DUPLICATE REQUEST 28/41/3244 0102   LABSPEC DUPLICATE REQUEST 72/53/6644 0347   PHURINE DUPLICATE REQUEST 42/59/5638 7564   GLUCOSEU DUPLICATE REQUEST 33/29/5188 1143   GLUCOSEU >=1000 11/07/2012 4166   HGBUR DUPLICATE REQUEST 07/19/1599 1143   BILIRUBINUR DUPLICATE REQUEST 09/32/3557 3220   KETONESUR DUPLICATE REQUEST 25/42/7062 3762   PROTEINUR DUPLICATE REQUEST 83/15/1761 1143   UROBILINOGEN 0.2 11/24/2014 6073   NITRITE DUPLICATE REQUEST 71/06/2692 1143   LEUKOCYTESUR DUPLICATE REQUEST 85/46/2703 1143     Drugs of Abuse     Component Value Date/Time   LABOPIA POSITIVE (A) 03/16/2019 1140   COCAINSCRNUR NONE DETECTED 03/16/2019 1140   LABBENZ NONE DETECTED 03/16/2019 1140   AMPHETMU NONE DETECTED 03/16/2019 1140   THCU NONE DETECTED 03/16/2019 [place] DETECTED 03/16/2019 1140      Radiological Exams on Admission: No results  found.   ------------------------------------------------------------------------------------------------------ Assessment/Plan: Active Problems:   Intractable nausea and vomiting  Intractable nausea and vomiting -Recurrent history of the same.  Has established history of gastroparesis. -[name] states he takes Reglan 3 times a day at home.  Not sure of compliance.  Will start on IV Reglan at this time. -Normal saline, gentle hydration.  Encourage  oral appetite. -Start on clear liquid diet.  Advance as tolerated. -IV Toradol for pain.  I would avoid IV opioids at this time.  Type 1 diabetes mellitus History of multiple episodes of DKA -Last A1c 7.8 on 1/30.  On insulin pump -Since [name] does not have a dependable oral intake, we will switch her from insulin pump to Lantus 10 units twice daily with sliding scale. -Follows up with endocrinologist as an outpatient.  Hypertension -Keep lisinopril on hold. -Monitor blood pressure.  IV hydralazine as needed.  Neurogenic bladder -[name] performs CIC every 8 hours.  UTI -[name] always has abnormal urinalysis.  Today it is turbid green with many bacteria.  Unclear if it is true urine infection or her normal.  She has been started on IV Rocephin in the ED.  We will continue same for now.  Chronic disease anemia -Continue iron supplement  DVT prophylaxis:  Lovenox Antimicrobials:  IV Rocephin Fluid: Normal saline at 50 mils per hour Diet: Clear liquid diet.  Advance as tolerated  Code Status:  Full code Mobility: Encourage ambulation Family Communication:  None at bedside Discharge plan:  Anticipated date: Depending on [name]'s symptoms progression.    ----------------------------------------------------------------------------------------------------------------------------------------------------------- Severity of Illness: The appropriate [name] status for this [name] is INPATIENT. Inpatient status is judged to be  reasonable and necessary in order to provide the required intensity of service to ensure the [name]'s safety. The [name]'s presenting symptoms, physical exam findings, and initial radiographic and laboratory data in the context of their chronic comorbidities is felt to place them at high risk for further clinical deterioration. Furthermore, it is not anticipated that the [name] will be medically stable for discharge from the hospital within 2 midnights of admission. The following factors support the [name] status of inpatient.   " The [name]'s presenting symptoms include intractable nausea and vomiting. " The worrisome physical exam findings include abdominal tenderness. " The initial radiographic and laboratory data are worrisome because of dehydration. " The chronic co-morbidities include diabetes mellitus.   * I certify that at the point of admission it is my clinical judgment that the [name] will require inpatient hospital care spanning beyond 2 midnights from the point of admission due to high intensity of service, high risk for further deterioration and high frequency of surveillance required.*   -----------------------------------------------------------------------------------------------------  Cline Cools, MD [facility] Pager: [phone] (Secure Chat preferred). 03/16/2019

## 2019-03-16 NOTE — ED Notes (Signed)
Report called, RN requesting pt have a COVID test before admission. Dr. Pietro Cassis paged.

## 2019-03-16 NOTE — ED Notes (Signed)
Pt has disconnected insulin pump

## 2019-03-17 LAB — BASIC METABOLIC PANEL
Anion gap: 16 — ABNORMAL HIGH (ref 5–15)
BUN: 16 mg/dL (ref 6–20)
CO2: 17 mmol/L — ABNORMAL LOW (ref 22–32)
Calcium: 8.6 mg/dL — ABNORMAL LOW (ref 8.9–10.3)
Chloride: 100 mmol/L (ref 98–111)
Creatinine, Ser: 0.93 mg/dL (ref 0.44–1.00)
GFR calc Af Amer: >60 mL/min (ref >60)
GFR calc non Af Amer: >60 mL/min (ref >60)
Glucose, Bld: 336 mg/dL — ABNORMAL HIGH (ref 70–99)
Potassium: 4.3 mmol/L (ref 3.5–5.1)
Sodium: 133 mmol/L — ABNORMAL LOW (ref 135–145)

## 2019-03-17 LAB — URINE CULTURE: Culture: 70000 — AB

## 2019-03-17 LAB — GLUCOSE, CAPILLARY
Glucose-Capillary: 331 mg/dL — ABNORMAL HIGH (ref 70–99)
Glucose-Capillary: 194 mg/dL — ABNORMAL HIGH (ref 70–99)
Glucose-Capillary: 129 mg/dL — ABNORMAL HIGH (ref 70–99)
Glucose-Capillary: 98 mg/dL (ref 70–99)

## 2019-03-17 LAB — CBC
HCT: 30.7 % — ABNORMAL LOW (ref 36.0–46.0)
Hemoglobin: 9.3 g/dL — ABNORMAL LOW (ref 12.0–15.0)
MCH: 26.8 pg (ref 26.0–34.0)
MCHC: 30.3 g/dL (ref 30.0–36.0)
MCV: 88.5 fL (ref 80.0–100.0)
Platelets: 437 10*3/uL — ABNORMAL HIGH (ref 150–400)
RBC: 3.47 MIL/uL — ABNORMAL LOW (ref 3.87–5.11)
RDW: 17.6 % — ABNORMAL HIGH (ref 11.5–15.5)
WBC: 9 10*3/uL (ref 4.0–10.5)
nRBC: 0 % (ref 0.0–0.2)

## 2019-03-17 LAB — MAGNESIUM: Magnesium: 1.7 mg/dL (ref 1.7–2.4)

## 2019-03-17 LAB — PHOSPHORUS: Phosphorus: 3.2 mg/dL (ref 2.5–4.6)

## 2019-03-17 MED ORDER — HYDROMORPHONE HCL 1 MG/ML IJ SOLN
0.5000 mg | INTRAMUSCULAR | Status: DC | PRN
  Administered 2019-03-17 – 2019-03-18 (×5): 0.5 mg via INTRAVENOUS
  Filled 2019-03-17 (×5): qty 0.5

## 2019-03-17 MED ORDER — HYDROMORPHONE HCL 1 MG/ML IJ SOLN
0.5000 mg | Freq: Once | INTRAMUSCULAR | Status: AC
  Administered 2019-03-17: 0.5 mg via INTRAVENOUS
  Filled 2019-03-17: qty 0.5

## 2019-03-17 MED ORDER — MIRTAZAPINE 15 MG PO TABS
15.0000 mg | ORAL_TABLET | Freq: Every day | ORAL | Status: DC
  Administered 2019-03-17 – 2019-04-05 (×14): 15 mg via ORAL
  Filled 2019-03-17 (×16): qty 1

## 2019-03-17 NOTE — Progress Notes (Addendum)
PROGRESS NOTE  [name]  DOB: March 09, 1990  PCP: Vicenta Aly, [place] UA:[id number]  DOA: 03/16/2019 Admitted From: Home  LOS: 1 day   Chief Complaint  Patient presents with  . Abdominal Pain  . Emesis   Brief narrative: Patient is a 29 year old female with PMH significant forT1DM on insulin pump,recurrent DKA, gastroparesis, intractable cyclicvomiting, multiple hospitalizations for the same, HTN,GERD, asthma. Patient presented to the ED on 2/25 with complaint of intractable nausea, vomiting. Patient had presented to the ED for similar complaint.  Symptomatic control was achieved and she was discharged home.  Chart reviewed. Patient was admitted recently to the hospital 2/7-2/15 for similar complaint.  She has a diagnosed history of diabetic gastroparesis because of which he has recurrent nausea, vomiting and abdominal pain leading to dehydration, DKA at times and hospital stay. Over time, patient seems to have grown tolerance to opioid.  On chart review, it is noted that patient presented multiple times in the ED here at [facility] as well as [facility] and prefers IV pain medicines. Gastric emptying study6/21/2018 with significantly delayed gastric emptying i. 2% emptied at 1 hr ( normal >= 10%) ii. 16% emptied at 2 hr ( normal >= 40%) iii. 20% emptied at 3 hr ( normal >= 70%) iv. 24% emptied at 4 hr ( normal >= 90%)   In the ED, patient was afebrile, tachycardic to 125, blood pressure 125/106.  Breathing comfortably on room air. Blood gas with pH elevated to 7.53, PCO2 elevated to 33. Sodium 137, potassium 3.6, BUN/creatinine 21/0.99, liver enzymes normal, lactic acid normal, WBC count normal at 10.4.  Serum glucose level 200. Urinalysis showed turbid yellow urine with many bacteria more than 50 WBCs.  Patient was admitted to hospitalist medicine service for further evaluation and management.  Subjective: Patient was seen and examined this morning.   [name].  Looks comfortable at the moment somebody enters the room.  Patient demonstrates grimacing and rocking her whole body complaining of severe abdominal pain.  She is asking for IV pain medicine. I had a long conversation with her about her propensity to use IV pain medicine and frequent ED visits to multiple hospitals.  She says she does not have a pain management doctor and has never been recommended to have one. I examined her in room with distraction I was able to palpate her abdomen ' without eliciting any tenderness or grimacing.'  Patient is however having nausea and vomiting for real.  She has severe gastroparesis and tends to have chronic vomiting.  Assessment/Plan:  Intractable nausea and vomiting -Recurrent history of the same.  Has established history of gastroparesis. -Patient states he takes Reglan 3 times a day at home.  Not sure of compliance.   -Currently on IV Reglan and Phenergan.  Continues to have vomiting.  He had an episode this morning and another 1 in the evening today.   -Continue IV hydration with normal saline.  Encourage oral appetite. -Keep on clear liquid diet for now. Advance as tolerated. -IV Toradol for pain.   -I had a long conversation with patient's mother as well this afternoon.  She thinks patient has true pain but she may be also gradually drifting towards pain medicine seeking behavior.  When she visited her daughter this afternoon, she was in tears.  Mom requests to put on low-dose pain medicine and I strongly counseled her about abuse potential.  I also added Remeron 15 mg nightly for sleeping aid.   Chronic  opiate seeking behavior. -I had a long conversation with her about her propensity to ask for IV pain medicine and frequent ED visits to multiple hospitals.  She says she does not have a pain management doctor and has never been recommended to have one. I examined her in room with distraction I was able to palpate her abdomen  ' without eliciting any tenderness or grimacing.'  Patient is however having nausea and vomiting for real.  She has severe gastroparesis and tends to have chronic vomiting.  Type 1 diabetes mellitus History of multiple episodes of DKA -Last A1c 7.8 on 1/30. On insulin pump -Since patient does not have a dependable oral intake, currently insulin pump is off and patient is on Lantus twice daily as well as sliding scale insulin with Accu-Cheks. -Follows up with endocrinologist as an outpatient.  Sinus tachycardia -Persistent.  Likely due to dehydration.  Continue IV hydration.  Hypertension -Keep lisinopril on hold. -Monitor blood pressure.  IV hydralazine as needed.  Neurogenic bladder -Patient performs CIC every 8 hours.  UTI -Patient always has abnormal urinalysis.  In this admission, urine is turbid green with many bacteria.  Unclear if it is true urine infection or her normal.  She has been started on IV Rocephin in the ED.  We will continue same for now.  Chronic disease anemia -Continue iron supplement  DVT prophylaxis: Lovenox Antimicrobials: IV Rocephin Fluid: Normal saline at 50 mils per hour Diet: Clear liquid diet.  Advance as tolerated  Code Status: Full code Mobility: Encourage ambulation Family Communication: None at bedside Discharge plan:  Anticipated date: Depending on patient's symptoms progression.    Antimicrobials: Anti-infectives (From admission, onward)   Start     Dose/Rate Route Frequency Ordered Stop   03/17/19 1000  cefTRIAXone (ROCEPHIN) 1 g in sodium chloride 0.9 % 100 mL IVPB     1 g 200 mL/hr over 30 Minutes Intravenous Daily 03/16/19 1708     03/16/19 1200  cefTRIAXone (ROCEPHIN) 1 g in sodium chloride 0.9 % 100 mL IVPB     1 g 200 mL/hr over 30 Minutes Intravenous  Once 03/16/19 1149 03/16/19 1433        Code Status: Full Code   Diet Order            Diet clear liquid Room service appropriate? Yes; Fluid consistency:  Thin  Diet effective now              Infusions:  . cefTRIAXone (ROCEPHIN)  IV Stopped (03/17/19 1127)  . lactated ringers 100 mL/hr at 03/17/19 1524    Scheduled Meds: . Chlorhexidine Gluconate Cloth  6 each Topical Daily  . enoxaparin (LOVENOX) injection  40 mg Subcutaneous Q24H  . ferrous sulfate  325 mg Oral QODAY  . insulin aspart  0-5 Units Subcutaneous QHS  . insulin aspart  0-9 Units Subcutaneous TID WC  . insulin glargine  10 Units Subcutaneous BID  . metoCLOPramide (REGLAN) injection  10 mg Intravenous Q8H  . mirtazapine  15 mg Oral QHS  . senna  1 tablet Oral BID  . sodium chloride flush  3 mL Intravenous Q12H    PRN meds: acetaminophen **OR** acetaminophen, HYDROmorphone (DILAUDID) injection, ketorolac, magnesium hydroxide, ondansetron **OR** ondansetron (ZOFRAN) IV, promethazine, sodium phosphate, sorbitol   Objective: Vitals:   03/17/19 0537 03/17/19 1408  BP: 126/90 (!) 129/99  Pulse: (!) 125 (!) 120  Resp: 18 16  Temp: 97.8 F (36.6 C) 98.9 F (37.2 C)  SpO2: 100% 100%  Intake/Output Summary (Last 24 hours) at 03/17/2019 1836 Last data filed at 03/17/2019 1253 Gross per 24 hour  Intake 1917.9 ml  Output --  Net 1917.9 ml   Filed Weights   03/17/19 0537  Weight: 51.6 kg   Weight change:  Body mass index is 19.53 kg/m.   Physical Exam: General exam: [name], not oriented.  Complains of severe abdominal pain. Skin: No rashes, lesions or ulcers. HEENT: Atraumatic, normocephalic, supple neck, no obvious bleeding Lungs: Clear to auscultation bilaterally CVS: Regular rhythm, tachycardic, no murmur GI/Abd soft, nontender, nondistended, bowel sound present CNS: Alert, awake, oriented x3 Psychiatry: Tearful, asking for pain medicine Extremities: No pedal edema, no calf tenderness  Data Review: I have personally reviewed the laboratory data and studies available.  Recent Labs  Lab 03/15/19 0122 03/16/19 0959  03/17/19 0420  WBC 10.6* 10.4 9.0  NEUTROABS 7.8* 7.9*  --   HGB 10.4* 10.5* 9.3*  HCT 33.3* 33.8* 30.7*  MCV 86.3 85.6 88.5  PLT 482* 476* 437*   Recent Labs  Lab 03/15/19 0122 03/16/19 0959 03/17/19 0420  NA 136 137 133*  K 3.6 3.6 4.3  CL 99 99 100  CO2 24 25 17*  GLUCOSE 300* 200* 336*  BUN 25* 21* 16  CREATININE 0.91 0.99 0.93  CALCIUM 9.4 9.5 8.6*  MG  --  1.9 1.7  PHOS  --   --  3.2    Signed, Terrilee Croak, MD [facility] Pager: [phone] (Secure Chat preferred). 03/17/2019

## 2019-03-17 NOTE — Progress Notes (Signed)
PHARMACY - PHYSICIAN COMMUNICATION CRITICAL VALUE ALERT - BLOOD CULTURE IDENTIFICATION (BCID)  [name] is an 29 y.o. female who presented to [facility] on 03/16/2019 with a chief complaint of Intractable nausea, vomiting  Assessment: 2/25 1 set BCx drawn from [facility]: anaerobic bottle only= gram positive cocci in clusters. ? contaminant. on CTX for UTI.  Name of physician (or Provider) Contacted: [place] via [place]  Current antibiotics: Ceftriaxone for UTI   Changes to prescribed antibiotics recommended: none   No results found for this or any previous visit.  Eudelia Bunch, Pharm.D 03/17/2019 10:56 AM

## 2019-03-17 NOTE — Progress Notes (Signed)
   Vital Signs MEWS/VS Documentation      03/16/2019 1948 03/16/2019 2110 03/16/2019 2129 03/17/2019 0122   MEWS Score:  2  2  1  2    MEWS Score Color:  Yellow  Yellow  Green  Yellow   Resp:  16  --  17  18   Pulse:  (!) 122  --  (!) 102  (!) 128   BP:  102/72  --  109/68  (!) 147/112   Temp:  99.3 F (37.4 C)  --  98.5 F (36.9 C)  --   O2 Device:  --  --  Room Air  Room Air   Level of Consciousness:  --  Alert  --  --       No acute change allogorthms in placed. Will continue to monitor patient.    Melrose Nakayama 03/17/2019,1:57 AM

## 2019-03-17 NOTE — Progress Notes (Signed)
   Vital Signs MEWS/VS Documentation      03/16/2019 2110 03/16/2019 2129 03/17/2019 0122 03/17/2019 0537   MEWS Score:  2  1  2  2    MEWS Score Color:  Yellow  Green  Yellow  Yellow   Resp:  --  17  18  18    Pulse:  --  (!) 102  (!) 128  (!) 125   BP:  --  109/68  (!) 147/112  126/90   Temp:  --  98.5 F (36.9 C)  --  97.8 F (36.6 C)   O2 Device:  --  Room [employer]  Room Air   Level of Consciousness:  Alert  --  --  --      No acute change.      Melrose Nakayama 03/17/2019,6:43 AM

## 2019-03-18 LAB — GLUCOSE, CAPILLARY
Glucose-Capillary: 57 mg/dL — ABNORMAL LOW (ref 70–99)
Glucose-Capillary: 139 mg/dL — ABNORMAL HIGH (ref 70–99)
Glucose-Capillary: 110 mg/dL — ABNORMAL HIGH (ref 70–99)
Glucose-Capillary: 67 mg/dL — ABNORMAL LOW (ref 70–99)
Glucose-Capillary: 173 mg/dL — ABNORMAL HIGH (ref 70–99)
Glucose-Capillary: 249 mg/dL — ABNORMAL HIGH (ref 70–99)

## 2019-03-18 LAB — BASIC METABOLIC PANEL
Anion gap: 8 (ref 5–15)
BUN: 13 mg/dL (ref 6–20)
CO2: 22 mmol/L (ref 22–32)
Calcium: 8.3 mg/dL — ABNORMAL LOW (ref 8.9–10.3)
Chloride: 109 mmol/L (ref 98–111)
Creatinine, Ser: 0.69 mg/dL (ref 0.44–1.00)
GFR calc Af Amer: >60 mL/min (ref >60)
GFR calc non Af Amer: >60 mL/min (ref >60)
Glucose, Bld: 83 mg/dL (ref 70–99)
Potassium: 3.6 mmol/L (ref 3.5–5.1)
Sodium: 139 mmol/L (ref 135–145)

## 2019-03-18 LAB — CBC
HCT: 26.5 % — ABNORMAL LOW (ref 36.0–46.0)
Hemoglobin: 8 g/dL — ABNORMAL LOW (ref 12.0–15.0)
MCH: 26.4 pg (ref 26.0–34.0)
MCHC: 30.2 g/dL (ref 30.0–36.0)
MCV: 87.5 fL (ref 80.0–100.0)
Platelets: 380 10*3/uL (ref 150–400)
RBC: 3.03 MIL/uL — ABNORMAL LOW (ref 3.87–5.11)
RDW: 17.9 % — ABNORMAL HIGH (ref 11.5–15.5)
WBC: 5.5 10*3/uL (ref 4.0–10.5)
nRBC: 0 % (ref 0.0–0.2)

## 2019-03-18 MED ORDER — DEXTROSE 50 % IV SOLN
12.5000 g | INTRAVENOUS | Status: AC
  Administered 2019-03-18: 12.5 g via INTRAVENOUS
  Filled 2019-03-18: qty 50

## 2019-03-18 MED ORDER — DEXTROSE 5 % IV SOLN: INTRAVENOUS | Status: DC

## 2019-03-18 MED ORDER — HYDROMORPHONE HCL 1 MG/ML IJ SOLN
0.5000 mg | Freq: Four times a day (QID) | INTRAMUSCULAR | Status: DC | PRN
  Administered 2019-03-18 – 2019-03-22 (×15): 0.5 mg via INTRAVENOUS
  Filled 2019-03-18 (×15): qty 0.5

## 2019-03-18 NOTE — Progress Notes (Signed)
Hypoglycemic Event  CBG: 67  Treatment: Pt waited for and ate supper Symptoms: None  Follow-up CBG: Time:1841 CBG Result:173  Possible Reasons for Event: Vomiting and Inadequate meal intake  Comments/MD notified: Dr. Pietro Cassis notified. New order given.  [name]

## 2019-03-18 NOTE — Progress Notes (Signed)
PROGRESS NOTE  [name]  DOB: 11-Jun-1990  PCP: [name], [name] UA:[id number]  DOA: 03/16/2019 Admitted From: Home  LOS: 2 days   Chief Complaint  Patient presents with  . Abdominal Pain  . Emesis   Brief narrative: Patient is a 29 year old female with PMH significant forT1DM on insulin pump,recurrent DKA, gastroparesis, intractable cyclicvomiting, multiple hospitalizations for the same, HTN,GERD, asthma. Patient presented to the ED on 2/25 with complaint of intractable nausea, vomiting. Patient had presented to the ED for similar complaint.  Symptomatic control was achieved and she was discharged home.  Chart reviewed. Patient was admitted recently to the hospital 2/7-2/15 for similar complaint.  She has a diagnosed history of diabetic gastroparesis because of which he has recurrent nausea, vomiting and abdominal pain leading to dehydration, DKA at times and hospital stay. Over time, patient seems to have grown tolerance to opioid.  On chart review, it is noted that patient presented multiple times [name] and prefers IV pain medicines. Gastric emptying study6/21/2018 with significantly delayed gastric emptying i. 2% emptied at 1 hr ( normal >= 10%) ii. 16% emptied at 2 hr ( normal >= 40%) iii. 20% emptied at 3 hr ( normal >= 70%) iv. 24% emptied at 4 hr ( normal >= 90%)  In the ED, patient was afebrile, tachycardic to 125, blood pressure 125/106.  Breathing comfortably on room air. Blood gas with pH elevated to 7.53, PCO2 elevated to 33. Sodium 137, potassium 3.6, BUN/creatinine 21/0.99, liver enzymes normal, lactic acid normal, WBC count normal at 10.4.  Serum glucose level 200. Urinalysis showed turbid yellow urine with many bacteria more than 50 WBCs.  Patient was admitted to hospitalist medicine service for further evaluation and management.  Subjective: Patient was seen and examined this morning.   [name].  She was walking on the hallway stooped forward.  She states it helps her pain.  Later I saw her rocking on the bed again. Says the low dose IV Dilaudid is helping her.  Reports one episode of vomiting this morning.  I asked her to sleep to nurse every time she had a vomiting.  Assessment/Plan: Intractable nausea and vomiting Intractable abdominal pain. -Recurrent history of the same.  Has established history of gastroparesis. -Patient states he takes Reglan 3 times a day at home.  Not sure of compliance.   -Currently on IV Reglan and Phenergan.  Continues to have vomiting.  He had an episode this morning and another 1 in the evening today.   -Continue IV hydration with normal saline.  Encourage oral appetite. -Advance diet to full liquid. -IV Toradol for pain.  Low-dose IV Dilaudid.  I would reduce the frequency today.  Chronic opiate seeking behavior. -I had multiple long conversations with patient and her mother about her propensity to ask for IV pain medicine and frequent ED visits to multiple hospitals.  She says she does not have a pain management doctor and has never been recommended to have one. On my examination with distraction, distraction I have been able to palpate her abdomen without eliciting any tenderness or grimacing.   Patient is however having nausea and vomiting for real.  She has severe gastroparesis and tends to have chronic vomiting.  Type 1 diabetes mellitus History of multiple episodes of DKA -Last A1c 7.8 on 1/30. On insulin pump at home.  Currently off. -Since patient does not have a dependable oral intake, currently insulin  pump is off and patient is on Lantus twice daily as well as sliding scale insulin with Accu-Cheks. -Follows up with endocrinologist as an outpatient.  Sinus tachycardia -Persistent.  Likely due to dehydration and pain.  Continue IV hydration.  Hypertension -Keep lisinopril on hold. -Monitor blood pressure.   IV hydralazine as needed.  Neurogenic bladder -Patient performs CIC every 8 hours.  Abnormal urinalysis/UTI -Patient always has abnormal urinalysis.  In this admission, urine is turbid green with many bacteria.  Unclear if it is true urine infection or her normal.  She has been started on IV Rocephin in the ED.  We will continue same for now.  Chronic disease anemia -Continue iron supplement  DVT prophylaxis: Lovenox Antimicrobials: IV Rocephin Fluid:  Continue normal saline at 50 mils per hour Diet: Clear liquid diet.  Advance as tolerated  Code Status: Full code Mobility: Encourage ambulation Family Communication: None at bedside Discharge plan:  Anticipated date:  Plan to discharge to home tomorrow. Antimicrobials: Anti-infectives (From admission, onward)   Start     Dose/Rate Route Frequency Ordered Stop   03/17/19 1000  cefTRIAXone (ROCEPHIN) 1 g in sodium chloride 0.9 % 100 mL IVPB     1 g 200 mL/hr over 30 Minutes Intravenous Daily 03/16/19 1708     03/16/19 1200  cefTRIAXone (ROCEPHIN) 1 g in sodium chloride 0.9 % 100 mL IVPB     1 g 200 mL/hr over 30 Minutes Intravenous  Once 03/16/19 1149 03/16/19 1433        Code Status: Full Code   Diet Order            Diet full liquid Room service appropriate? Yes; Fluid consistency: Thin  Diet effective now              Infusions:  . cefTRIAXone (ROCEPHIN)  IV 1 g (03/18/19 1008)  . lactated ringers 100 mL/hr at 03/18/19 1042    Scheduled Meds: . Chlorhexidine Gluconate Cloth  6 each Topical Daily  . enoxaparin (LOVENOX) injection  40 mg Subcutaneous Q24H  . ferrous sulfate  325 mg Oral QODAY  . insulin aspart  0-5 Units Subcutaneous QHS  . insulin aspart  0-9 Units Subcutaneous TID WC  . insulin glargine  10 Units Subcutaneous BID  . metoCLOPramide (REGLAN) injection  10 mg Intravenous Q8H  . mirtazapine  15 mg Oral QHS  . senna  1 tablet Oral BID  . sodium chloride flush  3 mL Intravenous Q12H     PRN meds: acetaminophen **OR** acetaminophen, HYDROmorphone (DILAUDID) injection, ketorolac, magnesium hydroxide, ondansetron **OR** ondansetron (ZOFRAN) IV, promethazine, sodium phosphate, sorbitol   Objective: Vitals:   03/18/19 0407 03/18/19 1343  BP: 114/69 100/75  Pulse: 100 100  Resp: 16 16  Temp: 98.3 F (36.8 C) 98.2 F (36.8 C)  SpO2: 100% 98%   No intake or output data in the 24 hours ending 03/18/19 1533 Filed Weights   03/17/19 0537  Weight: 51.6 kg   Weight change:  Body mass index is 19.53 kg/m.   Physical Exam: General exam: [name], not oriented.  Continues to complain of abdominal pain  skin: No rashes, lesions or ulcers. HEENT: Atraumatic, normocephalic, supple neck, no obvious bleeding Lungs: Clear to auscultation bilaterally CVS: Regular rhythm, tachycardic, no murmur GI/Abd soft, no tenderness on palpation with distraction, nondistended, bowel sound present CNS: Alert, awake, oriented x3 Psychiatry: Tearful, asking for pain medicine Extremities: No pedal edema, no calf tenderness  Data Review: I have personally  reviewed the laboratory data and studies available.  Recent Labs  Lab 03/15/19 0122 03/16/19 0959 03/17/19 0420 03/18/19 0606  WBC 10.6* 10.4 9.0 5.5  NEUTROABS 7.8* 7.9*  --   --   HGB 10.4* 10.5* 9.3* 8.0*  HCT 33.3* 33.8* 30.7* 26.5*  MCV 86.3 85.6 88.5 87.5  PLT 482* 476* 437* 380   Recent Labs  Lab 03/15/19 0122 03/16/19 0959 03/17/19 0420 03/18/19 0606  NA 136 137 133* 139  K 3.6 3.6 4.3 3.6  CL 99 99 100 109  CO2 24 25 17* 22  GLUCOSE 300* 200* 336* 83  BUN 25* 21* 16 13  CREATININE 0.91 0.99 0.93 0.69  CALCIUM 9.4 9.5 8.6* 8.3*  MG  --  1.9 1.7  --   PHOS  --   --  3.2  --     Signed, Terrilee Croak, MD [facility] Pager: [phone] (Secure Chat preferred). 03/18/2019

## 2019-03-18 NOTE — Progress Notes (Signed)
Hypoglycemic Event  CBG: 57 at 0757  Treatment: D50 25 mL (12.5 gm)  Symptoms: None  Follow-up CBG: [id number] CBG Result:139  Possible Reasons for Event: Vomiting and Inadequate meal intake  [name]

## 2019-03-19 LAB — GLUCOSE, CAPILLARY
Glucose-Capillary: 41 mg/dL — CL (ref 70–99)
Glucose-Capillary: 43 mg/dL — CL (ref 70–99)
Glucose-Capillary: 207 mg/dL — ABNORMAL HIGH (ref 70–99)
Glucose-Capillary: 157 mg/dL — ABNORMAL HIGH (ref 70–99)
Glucose-Capillary: 81 mg/dL (ref 70–99)
Glucose-Capillary: 168 mg/dL — ABNORMAL HIGH (ref 70–99)
Glucose-Capillary: 148 mg/dL — ABNORMAL HIGH (ref 70–99)

## 2019-03-19 LAB — CBC
HCT: 26.4 % — ABNORMAL LOW (ref 36.0–46.0)
Hemoglobin: 8.1 g/dL — ABNORMAL LOW (ref 12.0–15.0)
MCH: 26.6 pg (ref 26.0–34.0)
MCHC: 30.7 g/dL (ref 30.0–36.0)
MCV: 86.6 fL (ref 80.0–100.0)
Platelets: 397 10*3/uL (ref 150–400)
RBC: 3.05 MIL/uL — ABNORMAL LOW (ref 3.87–5.11)
RDW: 17.3 % — ABNORMAL HIGH (ref 11.5–15.5)
WBC: 4.7 10*3/uL (ref 4.0–10.5)
nRBC: 0 % (ref 0.0–0.2)

## 2019-03-19 LAB — BASIC METABOLIC PANEL
Anion gap: 6 (ref 5–15)
BUN: 8 mg/dL (ref 6–20)
CO2: 26 mmol/L (ref 22–32)
Calcium: 8.2 mg/dL — ABNORMAL LOW (ref 8.9–10.3)
Chloride: 104 mmol/L (ref 98–111)
Creatinine, Ser: 0.63 mg/dL (ref 0.44–1.00)
GFR calc Af Amer: >60 mL/min (ref >60)
GFR calc non Af Amer: >60 mL/min (ref >60)
Glucose, Bld: 212 mg/dL — ABNORMAL HIGH (ref 70–99)
Potassium: 3.5 mmol/L (ref 3.5–5.1)
Sodium: 136 mmol/L (ref 135–145)

## 2019-03-19 LAB — CULTURE, BLOOD (ROUTINE X 2)

## 2019-03-19 MED ORDER — POLYETHYLENE GLYCOL 3350 17 G PO PACK
17.0000 g | PACK | Freq: Every day | ORAL | Status: DC | PRN
  Administered 2019-03-19: 17 g via ORAL
  Filled 2019-03-19: qty 1

## 2019-03-19 MED ORDER — INSULIN GLARGINE 100 UNIT/ML ~~LOC~~ SOLN
10.0000 [IU] | Freq: Every day | SUBCUTANEOUS | Status: DC
  Administered 2019-03-20 – 2019-03-28 (×9): 10 [IU] via SUBCUTANEOUS
  Filled 2019-03-19 (×10): qty 0.1

## 2019-03-19 MED ORDER — DEXTROSE 50 % IV SOLN
25.0000 g | INTRAVENOUS | Status: AC
  Administered 2019-03-19: 25 g via INTRAVENOUS

## 2019-03-19 MED ORDER — DEXTROSE 50 % IV SOLN
INTRAVENOUS | Status: AC
  Filled 2019-03-19: qty 50

## 2019-03-19 NOTE — Progress Notes (Signed)
PROGRESS NOTE  [name]  DOB: [birth year]  PCP: Vicenta Aly, [place] MM:[id number]  DOA: 03/16/2019 Admitted From: Home  LOS: 3 days   Chief Complaint  Patient presents with  . Abdominal Pain  . Emesis   Brief narrative: Patient is a 29 year old female with PMH significant forT1DM on insulin pump,recurrent DKA, gastroparesis, intractable cyclicvomiting, multiple hospitalizations for the same, HTN,GERD, asthma. Patient presented to the ED on 2/25 with complaint of intractable nausea, vomiting. Patient had presented to the ED for similar complaint.  Symptomatic control was achieved and she was discharged home.  Chart reviewed. Patient was admitted recently to the hospital 2/7-2/15 for similar complaint.  She has a diagnosed history of diabetic gastroparesis because of which he has recurrent nausea, vomiting and abdominal pain leading to dehydration, DKA at times and hospital stay. Over time, patient seems to have grown tolerance to opioid.  On chart review, it is noted that patient presented multiple times in the ED [facility] at [facility] well as [facility] and prefers IV pain medicines. Gastric emptying study6/21/2018 with significantly delayed gastric emptying i. 2% emptied at 1 hr ( normal >= 10%) ii. 16% emptied at 2 hr ( normal >= 40%) iii. 20% emptied at 3 hr ( normal >= 70%) iv. 24% emptied at 4 hr ( normal >= 90%)  In the ED, patient was afebrile, tachycardic to 125, blood pressure 125/106.  Breathing comfortably on room air. Blood gas with pH elevated to 7.53, PCO2 elevated to 33. Sodium 137, potassium 3.6, BUN/creatinine 21/0.99, liver enzymes normal, lactic acid normal, WBC count normal at 10.4.  Serum glucose level 200. Urinalysis showed turbid yellow urine with many bacteria more than 50 WBCs.  Patient was admitted to hospitalist medicine service for further evaluation and management.  Subjective: Patient was seen and examined this morning.     Lying down in bed.  Not in distress.  Did not complain of pain to me this morning.  No vomiting.  Patient wants to advance to soft diet today.    Assessment/Plan: Intractable nausea and vomiting Intractable abdominal pain. -Recurrent history of the same.  Has established history of gastroparesis. -Patient states he takes Reglan 3 times a day at home.  Not sure of compliance.   -Currently on IV Reglan and Phenergan.  Vomiting and abdominal pain seem to be improving. -Continue IV fluids and symptomatic management. -Advance diet to soft diet today.  Chronic opiate seeking behavior. -In this hospitalization, I had multiple long conversations with patient and her mother about her propensity to ask for IV pain medicine and frequent ED visits to multiple hospitals.  She says she does not have a pain management doctor and has never been recommended to have one. On my examination with distraction, distraction I have been able to palpate her abdomen without eliciting any tenderness or grimacing.   Patient is however having nausea and vomiting for real.  She has severe gastroparesis and tends to have chronic vomiting. -Minimize the use of IV opiates.  Type 1 diabetes mellitus with hypoglycemia History of multiple episodes of DKA -Last A1c 7.8 on 1/30. On insulin pump at home.  Currently off. -Since patient does not have a dependable oral intake, currently insulin pump is off and patient is on Lantus twice daily as well as sliding scale insulin with Accu-Cheks. -Patient blood sugar dropped significantly couple times in the last 48 hours.  I started her on dextrose drip yesterday.  Diet has been advanced.  I  will stop dextrose drip.  I would minimize use of Lantus today and continue to monitor. -Follows up with endocrinologist as an outpatient.  Sinus tachycardia -Persistent.  Likely due to dehydration and pain.  Improving with hydration and pain control.  Hypertension -Keep lisinopril on  hold. -Monitor blood pressure.  IV hydralazine as needed.  Neurogenic bladder -Patient performs CIC every 8 hours.  Abnormal urinalysis/UTI -Patient always has abnormal urinalysis.  In this admission, urine is turbid green with many bacteria.  Unclear if it is true urine infection or her normal.  She has been started on IV Rocephin in the ED.  We will continue same for now.  Chronic disease anemia -Continue iron supplement  DVT prophylaxis: Lovenox Antimicrobials: IV Rocephin Fluid:  Okay to stop IV fluid today Diet: Clear liquid diet.  Advance as tolerated  Code Status: Full code Mobility: Encourage ambulation Family Communication: None at bedside Discharge plan:  Anticipated date:  Diet advanced today.  If able to tolerate, plan to discharge to home tomorrow.  Antimicrobials: Anti-infectives (From admission, onward)   Start     Dose/Rate Route Frequency Ordered Stop   03/17/19 1000  cefTRIAXone (ROCEPHIN) 1 g in sodium chloride 0.9 % 100 mL IVPB     1 g 200 mL/hr over 30 Minutes Intravenous Daily 03/16/19 1708     03/16/19 1200  cefTRIAXone (ROCEPHIN) 1 g in sodium chloride 0.9 % 100 mL IVPB     1 g 200 mL/hr over 30 Minutes Intravenous  Once 03/16/19 1149 03/16/19 1433        Code Status: Full Code   Diet Order            DIET SOFT Room service appropriate? Yes; Fluid consistency: Thin  Diet effective now              Infusions:  . cefTRIAXone (ROCEPHIN)  IV Stopped (03/19/19 1126)  . dextrose 50 mL/hr at 03/19/19 1329  . lactated ringers Stopped (03/18/19 1842)    Scheduled Meds: . Chlorhexidine Gluconate Cloth  6 each Topical Daily  . enoxaparin (LOVENOX) injection  40 mg Subcutaneous Q24H  . ferrous sulfate  325 mg Oral QODAY  . insulin aspart  0-5 Units Subcutaneous QHS  . insulin aspart  0-9 Units Subcutaneous TID WC  . insulin glargine  10 Units Subcutaneous BID  . metoCLOPramide (REGLAN) injection  10 mg Intravenous Q8H  . mirtazapine  15  mg Oral QHS  . senna  1 tablet Oral BID  . sodium chloride flush  3 mL Intravenous Q12H    PRN meds: acetaminophen **OR** acetaminophen, HYDROmorphone (DILAUDID) injection, ketorolac, magnesium hydroxide, ondansetron **OR** ondansetron (ZOFRAN) IV, polyethylene glycol, promethazine, sodium phosphate, sorbitol   Objective: Vitals:   03/19/19 0549 03/19/19 1347  BP: 119/82 96/63  Pulse: 97 91  Resp: 15 14  Temp: 98 F (36.7 C) 98.8 F (37.1 C)  SpO2: 100% 99%    Intake/Output Summary (Last 24 hours) at 03/19/2019 1437 Last data filed at 03/19/2019 1329 Gross per 24 hour  Intake 2681.21 ml  Output --  Net 2681.21 ml   Filed Weights   03/17/19 0537  Weight: 51.6 kg   Weight change:  Body mass index is 19.53 kg/m.   Physical Exam: General exam: [name], not oriented.  Improving  skin: No rashes, lesions or ulcers. HEENT: Atraumatic, normocephalic, supple neck, no obvious bleeding Lungs: Clear to auscultation bilaterally CVS: Regular rhythm, tachycardic, no murmur GI/Abd soft, no tenderness on palpation with  distraction, nondistended, bowel sound present CNS: Alert, awake, oriented x3 Psychiatry: Tearful, asking for pain medicine Extremities: No pedal edema, no calf tenderness  Data Review: I have personally reviewed the laboratory data and studies available.  Recent Labs  Lab 03/15/19 0122 03/16/19 0959 03/17/19 0420 03/18/19 0606 03/19/19 0650  WBC 10.6* 10.4 9.0 5.5 4.7  NEUTROABS 7.8* 7.9*  --   --   --   HGB 10.4* 10.5* 9.3* 8.0* 8.1*  HCT 33.3* 33.8* 30.7* 26.5* 26.4*  MCV 86.3 85.6 88.5 87.5 86.6  PLT 482* 476* 437* 380 397   Recent Labs  Lab 03/15/19 0122 03/16/19 0959 03/17/19 0420 03/18/19 0606 03/19/19 0650  NA 136 137 133* 139 136  K 3.6 3.6 4.3 3.6 3.5  CL 99 99 100 109 104  CO2 24 25 17* 22 26  GLUCOSE 300* 200* 336* 83 212*  BUN 25* 21* 16 13 8   CREATININE 0.91 0.99 0.93 0.69 0.63  CALCIUM 9.4 9.5 8.6* 8.3* 8.2*   MG  --  1.9 1.7  --   --   PHOS  --   --  3.2  --   --     Signed, Terrilee Croak, MD [facility] Pager: [phone] (Secure Chat preferred). 03/19/2019

## 2019-03-19 NOTE — Progress Notes (Signed)
Patient at this time has a yellow MEWS score. BP and Pulse are elevated. Patient is fidgeting around in bed, which is making vitals difficult to obtain. Patient reports pain unrelieved from dilaudid and Toradol.  On call provider was notified.  Orders received that no more pain medication will be given at this time. Will follow yellow MEWS protocol.Roderick Pee   Vital Signs MEWS/VS Documentation      03/19/2019 [id number] 03/19/2019 1347 03/19/2019 2224 03/19/2019 2309   MEWS Score:  0  1  2  2    MEWS Score Color:  Green  Green  Yellow  Yellow   Resp:  --  14  18  14    Pulse:  --  91  (!) 115  (!) 117   BP:  --  96/63  (!) 155/102  (!) 139/100   Temp:  --  98.8 F (37.1 C)  99.7 F (37.6 C)  99.3 F (37.4 C)   O2 Device:  --  Room [employer]   Level of Consciousness:  Alert  --  --  --           Roderick Pee 03/19/2019,11:23 PM

## 2019-03-19 NOTE — Progress Notes (Signed)
Hypoglycemic Event  CBG: 41  Treatment:4 oz juice  Symptoms: none  Follow-up CBG: Time: 0611 CBG Result:43  Possible Reasons for Event: Brittle DM 1  Comments/MD notified: N/A; protocol followed.    Patient then given Dextrose amp 25g at 0624  Follow up CBG: Time 0643 CBG Result: [address] M

## 2019-03-20 LAB — GLUCOSE, CAPILLARY
Glucose-Capillary: 335 mg/dL — ABNORMAL HIGH (ref 70–99)
Glucose-Capillary: 164 mg/dL — ABNORMAL HIGH (ref 70–99)
Glucose-Capillary: 100 mg/dL — ABNORMAL HIGH (ref 70–99)
Glucose-Capillary: 137 mg/dL — ABNORMAL HIGH (ref 70–99)

## 2019-03-20 MED ORDER — INSULIN ASPART 100 UNIT/ML ~~LOC~~ SOLN
3.0000 [IU] | Freq: Three times a day (TID) | SUBCUTANEOUS | Status: DC
  Administered 2019-03-22: 3 [IU] via SUBCUTANEOUS

## 2019-03-20 MED ORDER — PROMETHAZINE HCL 25 MG/ML IJ SOLN
25.0000 mg | Freq: Four times a day (QID) | INTRAMUSCULAR | Status: DC | PRN
  Administered 2019-03-20 – 2019-03-21 (×4): 25 mg via INTRAVENOUS
  Filled 2019-03-20 (×4): qty 1

## 2019-03-20 NOTE — Progress Notes (Signed)
Inpatient Diabetes Program Recommendations  AACE/ADA: New Consensus Statement on Inpatient Glycemic Control (2015)  Target Ranges:  Prepandial:   less than 140 mg/dL      Peak postprandial:   less than 180 mg/dL (1-2 hours)      Critically ill patients:  140 - 180 mg/dL   Lab Results  Component Value Date   GLUCAP 335 (H) 03/20/2019   HGBA1C 7.8 (H) 02/18/2019    Review of Glycemic Control  Diabetes history: DM1 Outpatient Diabetes medications: Insulin pump Current orders for Inpatient glycemic control: Lantus 10 units QD, Novolog 0-9 units tidwc and 0-5 units QHS  Insulin pump is off. Glucose 335 mg/dL this am. Worrisome for pt going back into DKA since no basal insulin in 2 days.  Added Lantus 10 units QD this am. Will need small amount of meal coverage if pt is consuming CHOs, since Type 1 and makes no insulin.  Inpatient Diabetes Program Recommendations:     Add Novolog 3 units tidwc for meal coverage insulin if pt eats > 50% meal.  Will continue to follow. Resume insulin pump at discharge.  Thank you. Lorenda Peck, RD, LDN, CDE Inpatient Diabetes Coordinator [phone]

## 2019-03-20 NOTE — Progress Notes (Signed)
PROGRESS NOTE  [name]  DOB: 05/29/1990  PCP: Vicenta Aly, [place] MM:[id number]  DOA: 03/16/2019 Admitted From: Home  LOS: 4 days   Chief Complaint  Patient presents with  . Abdominal Pain  . Emesis   Brief narrative: Patient is a 29 year old female with PMH significant forT1DM on insulin pump,recurrent DKA, gastroparesis, intractable cyclicvomiting, multiple hospitalizations for the same, HTN,GERD, asthma. Patient presented to the ED on 2/25 with complaint of intractable nausea, vomiting. Patient had presented to the ED for similar complaint.  Symptomatic control was achieved and she was discharged home.  Chart reviewed. Patient was admitted recently to the hospital 2/7-2/15 for similar complaint.  She has a diagnosed history of diabetic gastroparesis because of which he has recurrent nausea, vomiting and abdominal pain leading to dehydration, DKA at times and hospital stay. Over time, patient seems to have grown tolerance to opioid.  On chart review, it is noted that patient presented multiple times in the ED here at [facility] as well as [facility] and prefers IV pain medicines. Gastric emptying study6/21/2018 with significantly delayed gastric emptying i. 2% emptied at 1 hr ( normal >= 10%) ii. 16% emptied at 2 hr ( normal >= 40%) iii. 20% emptied at 3 hr ( normal >= 70%) iv. 24% emptied at 4 hr ( normal >= 90%)  In the ED, patient was afebrile, tachycardic to 125, blood pressure 125/106.  Breathing comfortably on room air. Blood gas with pH elevated to 7.53, PCO2 elevated to 33. Sodium 137, potassium 3.6, BUN/creatinine 21/0.99, liver enzymes normal, lactic acid normal, WBC count normal at 10.4.  Serum glucose level 200. Urinalysis showed turbid yellow urine with many bacteria more than 50 WBCs.  Patient was admitted to hospitalist medicine service for further evaluation and management.  Subjective: Patient was seen and examined this morning.     Patient was actively having more episodes of nausea and vomiting in last 24 hours. He was advanced to soft diet yesterday which he was unable to tolerate.   Assessment/Plan: Intractable nausea and vomiting Intractable abdominal pain. -Recurrent history of the same. Has established history of gastroparesis. -Patient states he takes Reglan 3 times a day at home.  Not sure of compliance.   -Currently on IV Reglan and IV Phenergan.  I increased the dose of IV fentanyl this morning. -I had advanced her diet to soft yesterday.  Retract to liquid diet today. -continue LR at 100/h.  Chronic opiate seeking behavior -In this hospitalization, I had multiple long conversations with patient and her mother about her propensity to ask for IV pain medicine and frequent ED visits to multiple hospitals.  She says she does not have a pain management doctor and has never been recommended to have one. On my examination with distraction, I have been able to palpate her abdomen without eliciting any tenderness or grimacing.   Patient is however having nausea and vomiting for real.  She has severe gastroparesis and tends to have chronic vomiting. -Minimize the use of IV opiates.  Type 1 diabetes mellitus with hypoglycemia History of multiple episodes of DKA -Last A1c 7.8 on 1/30. On insulin pump at home.  Currently off. -Oral intake is not dependable.  Persistent nausea and vomiting. -Patient had low blood sugar episodes dextrose drip which I stopped yesterday. -Without insulin, she is at risk of sliding down to DKA. -Diabetes coordinator consult appreciated. -We will add NovoLog 3 units 3 times daily with meals if patient needs more than 50%  of her meal. -Follows up with endocrinologist as an outpatient.  Sinus tachycardia -Persistent.  Likely due to dehydration and pain.  Tends to improve when vomiting improves.  Hypertension -Keep lisinopril on hold. -Monitor blood pressure.  IV hydralazine as  needed.  Neurogenic bladder -Patient performs CIC every 8 hours.  Abnormal urinalysis/UTI -Patient always has abnormal urinalysis.  In this admission, urine is turbid green with many bacteria.  Unclear if it is true urine infection or her normal.  She has been started on IV Rocephin in the ED.   was started today.  Chronic disease anemia -Continue iron supplement  DVT prophylaxis: Lovenox Antimicrobials:  Stop IV Rocephin today Fluid:  LR at 100  Diet: Clear liquid diet.  Advance as tolerated  Code Status: Full code Mobility: Encourage ambulation Family Communication: None at bedside.  I spoke to patient's mom on 2/28. Discharge plan:  Anticipated date:  Hold discharge today because patient is again having significant nausea and vomiting.  Hopefully in next 1 to 2 days.  Antimicrobials: Anti-infectives (From admission, onward)   Start     Dose/Rate Route Frequency Ordered Stop   03/17/19 1000  cefTRIAXone (ROCEPHIN) 1 g in sodium chloride 0.9 % 100 mL IVPB  Status:  Discontinued     1 g 200 mL/hr over 30 Minutes Intravenous Daily 03/16/19 1708 03/20/19 1716   03/16/19 1200  cefTRIAXone (ROCEPHIN) 1 g in sodium chloride 0.9 % 100 mL IVPB     1 g 200 mL/hr over 30 Minutes Intravenous  Once 03/16/19 1149 03/16/19 1433        Code Status: Full Code   Diet Order            Diet full liquid Room service appropriate? Yes; Fluid consistency: Thin  Diet effective now              Infusions:  . lactated ringers 100 mL/hr at 03/20/19 0330    Scheduled Meds: . Chlorhexidine Gluconate Cloth  6 each Topical Daily  . enoxaparin (LOVENOX) injection  40 mg Subcutaneous Q24H  . ferrous sulfate  325 mg Oral QODAY  . insulin aspart  0-5 Units Subcutaneous QHS  . insulin aspart  0-9 Units Subcutaneous TID WC  . insulin aspart  3 Units Subcutaneous TID WC  . insulin glargine  10 Units Subcutaneous Daily  . metoCLOPramide (REGLAN) injection  10 mg Intravenous Q8H  .  mirtazapine  15 mg Oral QHS  . senna  1 tablet Oral BID  . sodium chloride flush  3 mL Intravenous Q12H    PRN meds: acetaminophen **OR** acetaminophen, HYDROmorphone (DILAUDID) injection, ketorolac, magnesium hydroxide, ondansetron **OR** ondansetron (ZOFRAN) IV, polyethylene glycol, promethazine, sodium phosphate, sorbitol   Objective: Vitals:   03/20/19 0456 03/20/19 1342  BP: 135/89 (!) 115/93  Pulse: (!) 119 (!) 119  Resp: 14 16  Temp: 98.2 F (36.8 C) 98.4 F (36.9 C)  SpO2: 100% 98%    Intake/Output Summary (Last 24 hours) at 03/20/2019 1719 Last data filed at 03/20/2019 1300 Gross per 24 hour  Intake 762.35 ml  Output 350 ml  Net 412.35 ml   Filed Weights   03/17/19 0537  Weight: 51.6 kg   Weight change:  Body mass index is 19.53 kg/m.   Physical Exam: General exam: [name].  Sitting up in bed.  Rocking her body which she says she usually does while in pain. skin: No rashes, lesions or ulcers. HEENT: Atraumatic, normocephalic, supple neck, no obvious bleeding Lungs:  Clear to auscultation bilaterally CVS: Regular rhythm, tachycardic, no murmur GI/Abd soft, no tenderness on palpation with distraction, nondistended, bowel sound present CNS: Alert, awake, oriented x3 Psychiatry: Depressed look Extremities: No pedal edema, no calf tenderness  Data Review: I have personally reviewed the laboratory data and studies available.  Recent Labs  Lab 03/15/19 0122 03/16/19 0959 03/17/19 0420 03/18/19 0606 03/19/19 0650  WBC 10.6* 10.4 9.0 5.5 4.7  NEUTROABS 7.8* 7.9*  --   --   --   HGB 10.4* 10.5* 9.3* 8.0* 8.1*  HCT 33.3* 33.8* 30.7* 26.5* 26.4*  MCV 86.3 85.6 88.5 87.5 86.6  PLT 482* 476* 437* 380 397   Recent Labs  Lab 03/15/19 0122 03/16/19 0959 03/17/19 0420 03/18/19 0606 03/19/19 0650  NA 136 137 133* 139 136  K 3.6 3.6 4.3 3.6 3.5  CL 99 99 100 109 104  CO2 24 25 17* 22 26  GLUCOSE 300* 200* 336* 83 212*  BUN 25* 21* 16 13  8   CREATININE 0.91 0.99 0.93 0.69 0.63  CALCIUM 9.4 9.5 8.6* 8.3* 8.2*  MG  --  1.9 1.7  --   --   PHOS  --   --  3.2  --   --     Signed, Terrilee Croak, MD [facility] Pager: [phone] (Secure Chat preferred). 03/20/2019

## 2019-03-21 LAB — BASIC METABOLIC PANEL
Anion gap: 9 (ref 5–15)
BUN: 13 mg/dL (ref 6–20)
CO2: 24 mmol/L (ref 22–32)
Calcium: 8.5 mg/dL — ABNORMAL LOW (ref 8.9–10.3)
Chloride: 104 mmol/L (ref 98–111)
Creatinine, Ser: 0.72 mg/dL (ref 0.44–1.00)
GFR calc Af Amer: >60 mL/min (ref >60)
GFR calc non Af Amer: >60 mL/min (ref >60)
Glucose, Bld: 255 mg/dL — ABNORMAL HIGH (ref 70–99)
Potassium: 3.8 mmol/L (ref 3.5–5.1)
Sodium: 137 mmol/L (ref 135–145)

## 2019-03-21 LAB — PHOSPHORUS: Phosphorus: 3.3 mg/dL (ref 2.5–4.6)

## 2019-03-21 LAB — MAGNESIUM: Magnesium: 1.6 mg/dL — ABNORMAL LOW (ref 1.7–2.4)

## 2019-03-21 LAB — GLUCOSE, CAPILLARY
Glucose-Capillary: 213 mg/dL — ABNORMAL HIGH (ref 70–99)
Glucose-Capillary: 119 mg/dL — ABNORMAL HIGH (ref 70–99)
Glucose-Capillary: 77 mg/dL (ref 70–99)
Glucose-Capillary: 100 mg/dL — ABNORMAL HIGH (ref 70–99)

## 2019-03-21 MED ORDER — MAGNESIUM SULFATE 4 GM/100ML IV SOLN
4.0000 g | Freq: Once | INTRAVENOUS | Status: AC
  Administered 2019-03-21: 4 g via INTRAVENOUS
  Filled 2019-03-21: qty 100

## 2019-03-21 MED ORDER — METOCLOPRAMIDE HCL 5 MG/ML IJ SOLN
10.0000 mg | Freq: Four times a day (QID) | INTRAMUSCULAR | Status: DC
  Administered 2019-03-21 – 2019-04-17 (×106): 10 mg via INTRAVENOUS
  Filled 2019-03-21 (×105): qty 2

## 2019-03-21 MED ORDER — PROMETHAZINE HCL 25 MG/ML IJ SOLN
25.0000 mg | INTRAMUSCULAR | Status: DC | PRN
  Administered 2019-03-21 – 2019-04-11 (×66): 25 mg via INTRAVENOUS
  Filled 2019-03-21 (×71): qty 1

## 2019-03-21 MED ORDER — METOCLOPRAMIDE HCL 5 MG/ML IJ SOLN: 10.0000 mg | Freq: Three times a day (TID) | INTRAMUSCULAR | Status: DC | PRN

## 2019-03-21 MED ORDER — PROMETHAZINE HCL 25 MG/ML IJ SOLN: 25.0000 mg | INTRAMUSCULAR | Status: DC | PRN

## 2019-03-21 NOTE — Progress Notes (Signed)
PROGRESS NOTE  [name]  DOB: 1990/08/30  PCP: Vicenta Aly, [place] UA:[id number]  DOA: 03/16/2019 Admitted From: Home  LOS: 5 days   Chief Complaint  Patient presents with  . Abdominal Pain  . Emesis   Brief narrative: Patient is a 29 year old female with PMH significant forT1DM on insulin pump,recurrent DKA, gastroparesis, intractable cyclicvomiting, multiple hospitalizations for the same, HTN,GERD, asthma. Patient presented to the ED on 2/25 with complaint of intractable nausea, vomiting. Patient had presented to the ED for similar complaint.  Symptomatic control was achieved and she was discharged home.  Chart reviewed. Patient was admitted recently to the hospital 2/7-2/15 for similar complaint.  She has a diagnosed history of diabetic gastroparesis because of which he has recurrent nausea, vomiting and abdominal pain leading to dehydration, DKA at times and hospital stay. Over time, patient seems to have grown tolerance to opioid.  On chart review, it is noted that patient presented multiple times in the ED here at [facility] as well as [facility] and prefers IV pain medicines. Gastric emptying study6/21/2018 with significantly delayed gastric emptying i. 2% emptied at 1 hr ( normal >= 10%) ii. 16% emptied at 2 hr ( normal >= 40%) iii. 20% emptied at 3 hr ( normal >= 70%) iv. 24% emptied at 4 hr ( normal >= 90%)  In the ED, patient was afebrile, tachycardic to 125, blood pressure 125/106.  Breathing comfortably on room air. Blood gas with pH elevated to 7.53, PCO2 elevated to 33. Sodium 137, potassium 3.6, BUN/creatinine 21/0.99, liver enzymes normal, lactic acid normal, WBC count normal at 10.4.  Serum glucose level 200. Urinalysis showed turbid yellow urine with many bacteria more than 50 WBCs.  Patient was admitted to hospitalist medicine service for further evaluation and management.  Subjective: Patient was seen and examined this morning.     Roaming around the room. States that she has been vomiting all night long. Asking to increase Phenergan frequency.  Assessment/Plan: Intractable nausea and vomiting Intractable abdominal pain. -Recurrent history of the same. Has established history of gastroparesis. -Patient states she takes Reglan 3 times a day at home.  Not sure of compliance.   -Currently on IV Reglan scheduled and IV Phenergan.  Patient is still having persistent vomiting.  I increased the frequency of IV Phenergan 50 mg every 6 to every 4 hours -Switch diet to only clear liquid diet. -continue LR at 100/h.  Chronic opiate seeking behavior -In this hospitalization, I had multiple long conversations with patient and her mother about her propensity to ask for IV pain medicine and frequent ED visits to multiple hospitals.  She says she does not have a pain management doctor and has never been recommended to have one. On my examination with distraction, I have been able to palpate her abdomen without eliciting any tenderness or grimacing.   Patient is however having nausea and vomiting for real.  She has severe gastroparesis and tends to have chronic vomiting. -Minimize the use of IV opiates.  Currently on Dilaudid 0.5 mg every 6 hours as needed.  Type 1 diabetes mellitus with hypoglycemia History of multiple episodes of DKA -Last A1c 7.8 on 1/30. On insulin pump at home.  Currently off. -Oral intake is not dependable.  She has persistent nausea and vomiting. -Patient had low blood sugar episodes and was shortly started on dextrose drip. -Without insulin, she is at risk of sliding down to DKA. -Diabetes coordinator consult appreciated. -Currently on NovoLog 3 units 3  times daily with meals if patient needs more than 50% of her meal.  Continue Lantus as scheduled.  Does not assessment based on intake, improving vomiting and blood sugar response. -Follows up with endocrinologist as an outpatient.  Sinus  tachycardia -Persistent.  Likely due to dehydration and pain.  Tends to improve when vomiting improves.  Hypertension -Lisinopril remains on hold. -Monitor blood pressure. IV hydralazine as needed.  Neurogenic bladder -Patient performs CIC every 8 hours.  Abnormal urinalysis/UTI -Patient always has abnormal urinalysis.  In this admission, urine is turbid green with many bacteria.  Unclear if it is true urine infection or her normal.    She has completed 5-day course of IV Rocephin.  Chronic disease anemia -Continue iron supplement  Hypomagnesemia -Magnesium level low at 1.6.  4 g IV replacement ordered.  Constipation -No bowel movement in the last for 5 days.  Probably also affecting GI transit.  MiraLAX daily as needed ordered.  1 dose encouraged today.  DVT prophylaxis: Lovenox Antimicrobials:  Stop IV Rocephin Fluid:  LR at 100  Diet: Clear liquid diet.  Advance as tolerated  Code Status: Full code Mobility: Encourage ambulation Family Communication: None at bedside. I spoke to patient's mom on 2/28. Discharge plan:  Anticipated date:  Unable to discharge because of persistent nausea, vomiting, poor oral tolerance, blood sugar fluctuation in this patient with recurrent hospitalization.  Antimicrobials: Anti-infectives (From admission, onward)   Start     Dose/Rate Route Frequency Ordered Stop   03/17/19 1000  cefTRIAXone (ROCEPHIN) 1 g in sodium chloride 0.9 % 100 mL IVPB  Status:  Discontinued     1 g 200 mL/hr over 30 Minutes Intravenous Daily 03/16/19 1708 03/20/19 1716   03/16/19 1200  cefTRIAXone (ROCEPHIN) 1 g in sodium chloride 0.9 % 100 mL IVPB     1 g 200 mL/hr over 30 Minutes Intravenous  Once 03/16/19 1149 03/16/19 1433        Code Status: Full Code   Diet Order            Diet clear liquid Room service appropriate? Yes; Fluid consistency: Thin  Diet effective now              Infusions:  . lactated ringers 100 mL/hr at 03/21/19 1018     Scheduled Meds: . Chlorhexidine Gluconate Cloth  6 each Topical Daily  . enoxaparin (LOVENOX) injection  40 mg Subcutaneous Q24H  . ferrous sulfate  325 mg Oral QODAY  . insulin aspart  0-5 Units Subcutaneous QHS  . insulin aspart  0-9 Units Subcutaneous TID WC  . insulin aspart  3 Units Subcutaneous TID WC  . insulin glargine  10 Units Subcutaneous Daily  . metoCLOPramide (REGLAN) injection  10 mg Intravenous Q6H  . mirtazapine  15 mg Oral QHS  . senna  1 tablet Oral BID  . sodium chloride flush  3 mL Intravenous Q12H    PRN meds: acetaminophen **OR** acetaminophen, HYDROmorphone (DILAUDID) injection, ketorolac, magnesium hydroxide, ondansetron **OR** ondansetron (ZOFRAN) IV, polyethylene glycol, promethazine, sodium phosphate, sorbitol   Objective: Vitals:   03/20/19 2044 03/21/19 0611  BP: (!) 138/96 (!) 136/110  Pulse: (!) 104 (!) 108  Resp: 17 16  Temp: 99.1 F (37.3 C) 98.9 F (37.2 C)  SpO2: 96% 100%    Intake/Output Summary (Last 24 hours) at 03/21/2019 1343 Last data filed at 03/21/2019 0900 Gross per 24 hour  Intake 1065.81 ml  Output 400 ml  Net 665.81 ml   Danley Danker  Weights   03/17/19 0537  Weight: 51.6 kg   Weight change:  Body mass index is 19.53 kg/m.   Physical Exam: General exam: [name].  Walking in the room.  Holding emesis bag.  Thin built Skin: No rashes, lesions or ulcers. HEENT: Atraumatic, normocephalic, supple neck, no obvious bleeding Lungs: Clear to auscultation bilaterally CVS: Regular rhythm, tachycardic, no murmur GI/Abd soft, nondistended, bowel sound present CNS: Alert, awake, oriented x3 Psychiatry: Depressed look Extremities: No pedal edema, no calf tenderness  Data Review: I have personally reviewed the laboratory data and studies available.  Recent Labs  Lab 03/15/19 0122 03/16/19 0959 03/17/19 0420 03/18/19 0606 03/19/19 0650  WBC 10.6* 10.4 9.0 5.5 4.7  NEUTROABS 7.8* 7.9*  --   --   --   HGB  10.4* 10.5* 9.3* 8.0* 8.1*  HCT 33.3* 33.8* 30.7* 26.5* 26.4*  MCV 86.3 85.6 88.5 87.5 86.6  PLT 482* 476* 437* 380 397   Recent Labs  Lab 03/16/19 0959 03/17/19 0420 03/18/19 0606 03/19/19 0650 03/21/19 0611  NA 137 133* 139 136 137  K 3.6 4.3 3.6 3.5 3.8  CL 99 100 109 104 104  CO2 25 17* 22 26 24   GLUCOSE 200* 336* 83 212* 255*  BUN 21* 16 13 8 13   CREATININE 0.99 0.93 0.69 0.63 0.72  CALCIUM 9.5 8.6* 8.3* 8.2* 8.5*  MG 1.9 1.7  --   --  1.6*  PHOS  --  3.2  --   --  3.3    Signed, Terrilee Croak, MD [facility] Pager: [phone] (Secure Chat preferred). 03/21/2019

## 2019-03-22 DIAGNOSIS — E10649 Type 1 diabetes mellitus with hypoglycemia without coma: Secondary | ICD-10-CM

## 2019-03-22 DIAGNOSIS — R1084 Generalized abdominal pain: Secondary | ICD-10-CM

## 2019-03-22 DIAGNOSIS — K3184 Gastroparesis: Secondary | ICD-10-CM

## 2019-03-22 DIAGNOSIS — E1143 Type 2 diabetes mellitus with diabetic autonomic (poly)neuropathy: Secondary | ICD-10-CM

## 2019-03-22 LAB — BASIC METABOLIC PANEL
Anion gap: 8 (ref 5–15)
BUN: 8 mg/dL (ref 6–20)
CO2: 28 mmol/L (ref 22–32)
Calcium: 8.3 mg/dL — ABNORMAL LOW (ref 8.9–10.3)
Chloride: 100 mmol/L (ref 98–111)
Creatinine, Ser: 0.58 mg/dL (ref 0.44–1.00)
GFR calc Af Amer: >60 mL/min (ref >60)
GFR calc non Af Amer: >60 mL/min (ref >60)
Glucose, Bld: 155 mg/dL — ABNORMAL HIGH (ref 70–99)
Potassium: 3.3 mmol/L — ABNORMAL LOW (ref 3.5–5.1)
Sodium: 136 mmol/L (ref 135–145)

## 2019-03-22 LAB — GLUCOSE, CAPILLARY
Glucose-Capillary: 170 mg/dL — ABNORMAL HIGH (ref 70–99)
Glucose-Capillary: 85 mg/dL (ref 70–99)
Glucose-Capillary: 63 mg/dL — ABNORMAL LOW (ref 70–99)
Glucose-Capillary: 172 mg/dL — ABNORMAL HIGH (ref 70–99)
Glucose-Capillary: 56 mg/dL — ABNORMAL LOW (ref 70–99)
Glucose-Capillary: 178 mg/dL — ABNORMAL HIGH (ref 70–99)
Glucose-Capillary: 192 mg/dL — ABNORMAL HIGH (ref 70–99)
Glucose-Capillary: 53 mg/dL — ABNORMAL LOW (ref 70–99)

## 2019-03-22 MED ORDER — SODIUM CHLORIDE 0.9% FLUSH: 10.0000 mL | INTRAVENOUS | Status: DC | PRN

## 2019-03-22 MED ORDER — HYDROMORPHONE HCL 1 MG/ML IJ SOLN
0.5000 mg | INTRAMUSCULAR | Status: DC | PRN
  Administered 2019-03-22 – 2019-03-23 (×5): 0.5 mg via INTRAVENOUS
  Filled 2019-03-22 (×5): qty 0.5

## 2019-03-22 MED ORDER — DEXTROSE 50 % IV SOLN
12.5000 g | INTRAVENOUS | Status: AC
  Administered 2019-03-22: 12.5 g via INTRAVENOUS

## 2019-03-22 MED ORDER — DEXTROSE 50 % IV SOLN
1.0000 | INTRAVENOUS | Status: DC | PRN
  Administered 2019-03-22 – 2019-04-11 (×3): 50 mL via INTRAVENOUS
  Administered 2019-04-16: 25 mL via INTRAVENOUS
  Filled 2019-03-22 (×4): qty 50

## 2019-03-22 MED ORDER — DEXTROSE 50 % IV SOLN
INTRAVENOUS | Status: AC
  Filled 2019-03-22: qty 50

## 2019-03-22 MED ORDER — SODIUM CHLORIDE 0.9% FLUSH
10.0000 mL | Freq: Two times a day (BID) | INTRAVENOUS | Status: DC
  Administered 2019-03-22 – 2019-04-12 (×17): 10 mL
  Administered 2019-04-13: 20 mL
  Administered 2019-04-14 – 2019-04-17 (×6): 10 mL

## 2019-03-22 NOTE — Progress Notes (Addendum)
PROGRESS NOTE    [name]  [id number] DOB: 12/17/1990 DOA: 03/16/2019 PCP: Vicenta Aly, FNP   Brief Narrative: [name] is a 29 y.o. medical history with T1DM on insulin pump, GERD, asthma, diabetic gastroparesis/intractable cyclical vomiting, hypertension, history of previous DKA, pancreatitis. Patient presented secondary to nausea/vomiting with concern for gastroparesis flare. Currently on antiemetics, IV fluids and IV analgesics.   Assessment & Plan:   Active Problems:   Intractable nausea and vomiting   Intractable nausea and vomiting Secondary to gastroparesis. Several admissions/ED visits for similar symptoms. Bad night last night with recurrent vomiting. Patient is supposed to be following up with [facility] but is never well enough to get there. I discussed that when she is improved, she should promptly attempt follow-up with her doctors at [facility]. -Continue Reglan IV scheduled and Phenergan IV prn -Continue Clear liquid diet in case she is able to tolerate a diet  Abdominal pain Secondary to above. -Continue Dilaudid; increase frequency to q3 hours prn  Diabetes mellitus, type 1 Uncontrolled with hyperglycemia and hypoglycemia. Insulin pump off. -Continue Lantus 10 units daily, Novolog 3 units TID WC and SSI  Essential hypertension Patient is on lisinopril as an outpatient. Held secondary to poor oral tolerance  Asthma Stable. No wheezing  Neurogenic bladder Patient follows with urology. Patient self catheterizes. -Continue CIC q8 hours  Iron deficiency anemia Chronic. Stable. Patient is on iron supplementation as an outpatient. -Continue iron supplementation as able  Constipation -Suppository, MiraLAX, Senakot-S   DVT prophylaxis: SCDs Code Status:   Code Status: Full Code Family Communication: None at bedside Disposition Plan: Discharge pending improvement of nausea/vomiting   Consultants:   None  Procedures:   None   Antimicrobials:  None   Subjective: Significant vomiting overnight  Objective: Vitals:   03/21/19 1423 03/21/19 2143 03/22/19 0548 03/22/19 1301  BP: 116/84 113/75 (!) 136/95 (!) 137/95  Pulse: 94 88 (!) 107 92  Resp: 16 19 16 18   Temp: 99 F (37.2 C) 98.5 F (36.9 C) 98.8 F (37.1 C) 97.8 F (36.6 C)  TempSrc: Oral Oral Oral Oral  SpO2: 100% 100% 100% 100%  Weight:      Height:        Intake/Output Summary (Last 24 hours) at 03/22/2019 1320 Last data filed at 03/22/2019 0600 Gross per 24 hour  Intake 2872.65 ml  Output 400 ml  Net 2472.65 ml   Filed Weights   03/17/19 0537  Weight: 51.6 kg    Examination:  General exam: Appears calm and comfortable Respiratory system: Clear to auscultation. Respiratory effort normal. Cardiovascular system: S1 & S2 heard, RRR. No murmurs, rubs, gallops or clicks. Gastrointestinal system: Abdomen is nondistended, soft and mildly tender. No organomegaly or masses felt. Normal bowel sounds heard. Central nervous system: Alert and oriented. No focal neurological deficits. Extremities: No edema. No calf tenderness Skin: No cyanosis. No rashes Psychiatry: Judgement and insight appear normal. Mood & affect appropriate.     Data Reviewed: I have personally reviewed following labs and imaging studies  CBC: Recent Labs  Lab 03/16/19 0959 03/17/19 0420 03/18/19 0606 03/19/19 0650  WBC 10.4 9.0 5.5 4.7  NEUTROABS 7.9*  --   --   --   HGB 10.5* 9.3* 8.0* 8.1*  HCT 33.8* 30.7* 26.5* 26.4*  MCV 85.6 88.5 87.5 86.6  PLT 476* 437* 380 [id number]   Basic Metabolic Panel: Recent Labs  Lab 03/16/19 0959 03/16/19 0959 03/17/19 0420 03/18/19 0606 03/19/19 0650 03/21/19 0611 03/22/19 0542  NA 137   < >  133* 139 136 137 136  K 3.6   < > 4.3 3.6 3.5 3.8 3.3*  CL 99   < > 100 109 104 104 100  CO2 25   < > 17* 22 26 24 28   GLUCOSE 200*   < > 336* 83 212* 255* 155*  BUN 21*   < > 16 13 8 13 8   CREATININE 0.99   < > 0.93 0.69 0.63 0.72 0.58   CALCIUM 9.5   < > 8.6* 8.3* 8.2* 8.5* 8.3*  MG 1.9  --  1.7  --   --  1.6*  --   PHOS  --   --  3.2  --   --  3.3  --    < > = values in this interval not displayed.   GFR: Estimated Creatinine Clearance: 84.5 mL/min (by C-G formula based on SCr of 0.58 mg/dL). Liver Function Tests: Recent Labs  Lab 03/16/19 0959  AST 10*  ALT 8  ALKPHOS 80  BILITOT 1.1  PROT 7.4  ALBUMIN 3.7   Recent Labs  Lab 03/16/19 0959  LIPASE 14   No results for input(s): AMMONIA in the last 168 hours. Coagulation Profile: No results for input(s): INR, PROTIME in the last 168 hours. Cardiac Enzymes: No results for input(s): CKTOTAL, CKMB, CKMBINDEX, TROPONINI in the last 168 hours. BNP (last 3 results) No results for input(s): PROBNP in the last 8760 hours. HbA1C: No results for input(s): HGBA1C in the last 72 hours. CBG: Recent Labs  Lab 03/21/19 1226 03/21/19 1631 03/21/19 2158 03/22/19 0742 03/22/19 1144  GLUCAP 119* 77 100* 170* 85   Lipid Profile: No results for input(s): CHOL, HDL, LDLCALC, TRIG, CHOLHDL, LDLDIRECT in the last 72 hours. Thyroid Function Tests: No results for input(s): TSH, T4TOTAL, FREET4, T3FREE, THYROIDAB in the last 72 hours. Anemia Panel: No results for input(s): VITAMINB12, FOLATE, FERRITIN, TIBC, IRON, RETICCTPCT in the last 72 hours. Sepsis Labs: Recent Labs  Lab 03/16/19 1149  LATICACIDVEN 0.6    Recent Results (from the past 240 hour(s))  Urine culture     Status: Abnormal   Collection Time: 03/16/19 11:43 AM   Specimen: Urine, Random  Result Value Ref Range Status   Specimen Description   Final    URINE, RANDOM Performed at [facility] [address] [place], [place] [address]    Special Requests   Final    NONE Performed at [facility], [place] [address] [place], [place] [address]    Culture (A)  Final    70,000 COLONIES/mL CORYNEBACTERIUM SPECIES Standardized susceptibility testing for this  organism is not available. Performed at [facility], [place] [address], [place], [place] [address]    Report Status 03/17/2019 FINAL  Final  Culture, blood (routine x 2)     Status: Abnormal   Collection Time: 03/16/19 12:30 PM   Specimen: BLOOD  Result Value Ref Range Status   Specimen Description   Final    BLOOD RIGHT CHEST PORT Performed at [facility] [address] [place], [place] [address]    Special Requests   Final    BOTTLES DRAWN AEROBIC AND ANAEROBIC Blood Culture results may not be optimal due to an inadequate volume of blood received in culture bottles Performed at [facility] [address], [place], [place] [address]    Culture  Setup Time   Final    GRAM POSITIVE COCCI IN CLUSTERS ANAEROBIC BOTTLE ONLY  CRITICAL RESULT CALLED TO, READ BACK BY AND VERIFIED WITH: PHARMD T. GREEN [id number] FCP    Culture (A)  Final    STAPHYLOCOCCUS SPECIES (COAGULASE NEGATIVE) THE SIGNIFICANCE OF ISOLATING THIS ORGANISM FROM A SINGLE SET OF BLOOD CULTURES WHEN MULTIPLE SETS ARE DRAWN IS UNCERTAIN. PLEASE NOTIFY THE MICROBIOLOGY DEPARTMENT WITHIN ONE WEEK IF SPECIATION AND SENSITIVITIES ARE REQUIRED. Performed at [facility], [place] [address], [place], [place] [address]    Report Status 03/19/2019 FINAL  Final  SARS CORONAVIRUS 2 (TAT 6-24 HRS) Nasopharyngeal Nasopharyngeal Swab     Status: None   Collection Time: 03/16/19  4:25 PM   Specimen: Nasopharyngeal Swab  Result Value Ref Range Status   SARS Coronavirus 2 NEGATIVE NEGATIVE Final    Comment: (NOTE) SARS-CoV-2 target nucleic acids are NOT DETECTED. The SARS-CoV-2 RNA is generally detectable in upper and lower respiratory specimens during the acute phase of infection. Negative results do not preclude SARS-CoV-2 infection, do not rule out co-infections with other pathogens, and should not be used as the sole basis for treatment or other patient management decisions.  Negative results must be combined with clinical observations, patient history, and epidemiological information. The expected result is Negative. Fact Sheet for Patients: SugarRoll.be Fact Sheet for Healthcare Providers: https://www.woods-mathews.com/ This test is not yet approved or cleared by the [place] FDA and  has been authorized for detection and/or diagnosis of SARS-CoV-2 by FDA under an Emergency Use Authorization (EUA). This EUA will remain  in effect (meaning this test can be used) for the duration of the COVID-19 declaration under Section 56 4(b)(1) of the Act, 21 U.S.C. section 360bbb-3(b)(1), unless the authorization is terminated or revoked sooner. Performed at [facility], [place] [address], [place], [place] [address]          Radiology Studies: No results found.      Scheduled Meds: . Chlorhexidine Gluconate Cloth  6 each Topical Daily  . enoxaparin (LOVENOX) injection  40 mg Subcutaneous Q24H  . ferrous sulfate  325 mg Oral QODAY  . insulin aspart  0-5 Units Subcutaneous QHS  . insulin aspart  0-9 Units Subcutaneous TID WC  . insulin aspart  3 Units Subcutaneous TID WC  . insulin glargine  10 Units Subcutaneous Daily  . metoCLOPramide (REGLAN) injection  10 mg Intravenous Q6H  . mirtazapine  15 mg Oral QHS  . senna  1 tablet Oral BID  . sodium chloride flush  10-40 mL Intracatheter Q12H  . sodium chloride flush  3 mL Intravenous Q12H   Continuous Infusions: . lactated ringers 50 mL/hr at 03/22/19 0500     LOS: 6 days     Cordelia Poche, MD [facility] 03/22/2019, 1:20 PM  If 7PM-7AM, please contact night-coverage [email or web address]

## 2019-03-22 NOTE — Progress Notes (Signed)
Hypoglycemic Event  CBG: 63  Treatment: 4 oz juice/soda  Symptoms: None  Follow-up CBG: Time:1535 CBG Result:56  Possible Reasons for Event: Inadequate meal intake  Comments/MD notified:12.5mg  of Dextrose IV given. Follow up CBG 172    Wilkie Aye Dionne

## 2019-03-23 LAB — BASIC METABOLIC PANEL
Anion gap: 8 (ref 5–15)
BUN: 7 mg/dL (ref 6–20)
CO2: 29 mmol/L (ref 22–32)
Calcium: 8.3 mg/dL — ABNORMAL LOW (ref 8.9–10.3)
Chloride: 101 mmol/L (ref 98–111)
Creatinine, Ser: 0.58 mg/dL (ref 0.44–1.00)
GFR calc Af Amer: >60 mL/min (ref >60)
GFR calc non Af Amer: >60 mL/min (ref >60)
Glucose, Bld: 124 mg/dL — ABNORMAL HIGH (ref 70–99)
Potassium: 3.4 mmol/L — ABNORMAL LOW (ref 3.5–5.1)
Sodium: 138 mmol/L (ref 135–145)

## 2019-03-23 LAB — GLUCOSE, CAPILLARY
Glucose-Capillary: 138 mg/dL — ABNORMAL HIGH (ref 70–99)
Glucose-Capillary: 215 mg/dL — ABNORMAL HIGH (ref 70–99)
Glucose-Capillary: 115 mg/dL — ABNORMAL HIGH (ref 70–99)
Glucose-Capillary: 209 mg/dL — ABNORMAL HIGH (ref 70–99)

## 2019-03-23 MED ORDER — INSULIN ASPART 100 UNIT/ML ~~LOC~~ SOLN
0.0000 [IU] | Freq: Three times a day (TID) | SUBCUTANEOUS | Status: DC
  Administered 2019-03-23 – 2019-03-24 (×2): 2 [IU] via SUBCUTANEOUS
  Administered 2019-03-24 – 2019-03-25 (×2): 1 [IU] via SUBCUTANEOUS
  Administered 2019-03-25: 5 [IU] via SUBCUTANEOUS
  Administered 2019-03-25: 2 [IU] via SUBCUTANEOUS
  Administered 2019-03-26: 1 [IU] via SUBCUTANEOUS
  Administered 2019-03-26: 08:00:00 4 [IU] via SUBCUTANEOUS
  Administered 2019-03-26: 2 [IU] via SUBCUTANEOUS
  Administered 2019-03-28: 1 [IU] via SUBCUTANEOUS
  Administered 2019-03-29: 3 [IU] via SUBCUTANEOUS
  Administered 2019-03-29: 4 [IU] via SUBCUTANEOUS
  Administered 2019-03-30: 2 [IU] via SUBCUTANEOUS
  Administered 2019-03-30: 17:00:00 1 [IU] via SUBCUTANEOUS
  Administered 2019-03-30: 3 [IU] via SUBCUTANEOUS
  Administered 2019-03-31: 4 [IU] via SUBCUTANEOUS
  Administered 2019-03-31 – 2019-04-01 (×2): 2 [IU] via SUBCUTANEOUS
  Administered 2019-04-01 – 2019-04-02 (×2): 1 [IU] via SUBCUTANEOUS
  Administered 2019-04-02: 2 [IU] via SUBCUTANEOUS
  Administered 2019-04-03: 1 [IU] via SUBCUTANEOUS
  Administered 2019-04-03: 3 [IU] via SUBCUTANEOUS
  Administered 2019-04-05: 1 [IU] via SUBCUTANEOUS
  Administered 2019-04-05: 4 [IU] via SUBCUTANEOUS
  Administered 2019-04-05: 1 [IU] via SUBCUTANEOUS
  Administered 2019-04-06: 3 [IU] via SUBCUTANEOUS

## 2019-03-23 MED ORDER — HYDROMORPHONE HCL 1 MG/ML IJ SOLN
0.5000 mg | INTRAMUSCULAR | Status: DC | PRN
  Administered 2019-03-23 – 2019-03-24 (×5): 0.5 mg via INTRAVENOUS
  Filled 2019-03-23 (×5): qty 0.5

## 2019-03-23 MED ORDER — PANTOPRAZOLE SODIUM 40 MG IV SOLR
40.0000 mg | Freq: Two times a day (BID) | INTRAVENOUS | Status: DC
  Administered 2019-03-23 – 2019-04-17 (×51): 40 mg via INTRAVENOUS
  Filled 2019-03-23 (×51): qty 40

## 2019-03-23 MED ORDER — HYDROMORPHONE HCL 1 MG/ML IJ SOLN
0.5000 mg | Freq: Once | INTRAMUSCULAR | Status: AC
  Administered 2019-03-23: 09:00:00 0.5 mg via INTRAVENOUS
  Filled 2019-03-23: qty 0.5

## 2019-03-23 NOTE — Progress Notes (Signed)
PROGRESS NOTE    [name]  [id number] DOB: January 02, 1991 DOA: 03/16/2019 PCP: Vicenta Aly, FNP   Brief Narrative: [name] is a 29 y.o. medical history with T1DM on insulin pump, GERD, asthma, diabetic gastroparesis/intractable cyclical vomiting, hypertension, history of previous DKA, pancreatitis. Patient presented secondary to nausea/vomiting with concern for gastroparesis flare. Currently on antiemetics, IV fluids and IV analgesics.   Assessment & Plan:   Active Problems:   Intractable nausea and vomiting   Intractable nausea and vomiting Secondary to gastroparesis. Several admissions/ED visits for similar symptoms. Bad night last night with recurrent vomiting. Patient is supposed to be following up with [facility] but is never well enough to get there. I discussed that when she is improved, she should promptly attempt follow-up with her doctors at [facility] -Continue Reglan IV scheduled and Phenergan IV prn -Continue Clear liquid diet in case she is able to tolerate a diet -Will consult gastroenterology for assistance  Abdominal pain Secondary to above. -Continue Dilaudid; increase frequency to q4 hours prn  Diabetes mellitus, type 1 Uncontrolled with hyperglycemia and hypoglycemia. Insulin pump off. -Continue Lantus 10 units daily and switch to very sensitive SSI -Discontinue Novolog 3 units TID WC  Essential hypertension Patient is on lisinopril as an outpatient. Held secondary to poor oral tolerance  Asthma Stable. No wheezing  Neurogenic bladder Patient follows with urology. Patient self catheterizes. -Continue CIC q8 hours  Iron deficiency anemia Chronic. Stable. Patient is on iron supplementation as an outpatient. -Continue iron supplementation as able  Constipation -Suppository, MiraLAX, Senakot-S   DVT prophylaxis: SCDs Code Status:   Code Status: Full Code Family Communication: None at bedside Disposition Plan: Discharge pending  improvement of nausea/vomiting and ability to tolerate oral nutrition   Consultants:   Gastroenterology  Procedures:   None  Antimicrobials:  None   Subjective: Continues to have vomiting and abdominal pain.  Objective: Vitals:   03/21/19 2143 03/22/19 0548 03/22/19 1301 03/22/19 2033  BP: 113/75 (!) 136/95 (!) 137/95 109/80  Pulse: 88 (!) 107 92 91  Resp: 19 16 18 14   Temp: 98.5 F (36.9 C) 98.8 F (37.1 C) 97.8 F (36.6 C) 98.4 F (36.9 C)  TempSrc: Oral Oral Oral Oral  SpO2: 100% 100% 100% 100%  Weight:      Height:        Intake/Output Summary (Last 24 hours) at 03/23/2019 0900 Last data filed at 03/23/2019 0300 Gross per 24 hour  Intake 2140.06 ml  Output --  Net 2140.06 ml   Filed Weights   03/17/19 0537  Weight: 51.6 kg    Examination:  General exam: Appears calm and comfortable Respiratory system: Clear to auscultation. Respiratory effort normal. Cardiovascular system: S1 & S2 heard, RRR. No murmurs, rubs, gallops or clicks. Gastrointestinal system: Abdomen is nondistended, soft and mildly tender. No organomegaly or masses felt. Normal bowel sounds heard. Central nervous system: Alert and oriented. No focal neurological deficits. Extremities: No edema. No calf tenderness Skin: No cyanosis. No rashes Psychiatry: Judgement and insight appear normal. Mood & affect appropriate.     Data Reviewed: I have personally reviewed following labs and imaging studies  CBC: Recent Labs  Lab 03/16/19 0959 03/17/19 0420 03/18/19 0606 03/19/19 0650  WBC 10.4 9.0 5.5 4.7  NEUTROABS 7.9*  --   --   --   HGB 10.5* 9.3* 8.0* 8.1*  HCT 33.8* 30.7* 26.5* 26.4*  MCV 85.6 88.5 87.5 86.6  PLT 476* 437* 380 [id number]   Basic Metabolic Panel: Recent Labs  Lab 03/16/19 0959 03/16/19 0959 03/17/19 0420 03/17/19 0420 03/18/19 0606 03/19/19 0650 03/21/19 0611 03/22/19 0542 03/23/19 0556  NA 137   < > 133*   < > 139 136 137 136 138  K 3.6   < > 4.3   < > 3.6 3.5  3.8 3.3* 3.4*  CL 99   < > 100   < > 109 104 104 100 101  CO2 25   < > 17*   < > 22 26 24 28 29   GLUCOSE 200*   < > 336*   < > 83 212* 255* 155* 124*  BUN 21*   < > 16   < > 13 8 13 8 7   CREATININE 0.99   < > 0.93   < > 0.69 0.63 0.72 0.58 0.58  CALCIUM 9.5   < > 8.6*   < > 8.3* 8.2* 8.5* 8.3* 8.3*  MG 1.9  --  1.7  --   --   --  1.6*  --   --   PHOS  --   --  3.2  --   --   --  3.3  --   --    < > = values in this interval not displayed.   GFR: Estimated Creatinine Clearance: 84.5 mL/min (by C-G formula based on SCr of 0.58 mg/dL). Liver Function Tests: Recent Labs  Lab 03/16/19 0959  AST 10*  ALT 8  ALKPHOS 80  BILITOT 1.1  PROT 7.4  ALBUMIN 3.7   Recent Labs  Lab 03/16/19 0959  LIPASE 14   No results for input(s): AMMONIA in the last 168 hours. Coagulation Profile: No results for input(s): INR, PROTIME in the last 168 hours. Cardiac Enzymes: No results for input(s): CKTOTAL, CKMB, CKMBINDEX, TROPONINI in the last 168 hours. BNP (last 3 results) No results for input(s): PROBNP in the last 8760 hours. HbA1C: No results for input(s): HGBA1C in the last 72 hours. CBG: Recent Labs  Lab 03/22/19 1602 03/22/19 1755 03/22/19 2030 03/22/19 2125 03/23/19 0734  GLUCAP 172* 178* 53* 192* 138*   Lipid Profile: No results for input(s): CHOL, HDL, LDLCALC, TRIG, CHOLHDL, LDLDIRECT in the last 72 hours. Thyroid Function Tests: No results for input(s): TSH, T4TOTAL, FREET4, T3FREE, THYROIDAB in the last 72 hours. Anemia Panel: No results for input(s): VITAMINB12, FOLATE, FERRITIN, TIBC, IRON, RETICCTPCT in the last 72 hours. Sepsis Labs: Recent Labs  Lab 03/16/19 1149  LATICACIDVEN 0.6    Recent Results (from the past 240 hour(s))  Urine culture     Status: Abnormal   Collection Time: 03/16/19 11:43 AM   Specimen: Urine, Random  Result Value Ref Range Status   Specimen Description   Final    URINE, RANDOM Performed at [facility]  [address], [place], [place] [address]    Special Requests   Final    NONE Performed at [facility], [place] [address], [place], [place] [address]    Culture (A)  Final    70,000 COLONIES/mL CORYNEBACTERIUM SPECIES Standardized susceptibility testing for this organism is not available. Performed at [facility], [place] [address], [place], [place] [address]    Report Status 03/17/2019 FINAL  Final  Culture, blood (routine x 2)     Status: Abnormal   Collection Time: 03/16/19 12:30 PM   Specimen: BLOOD  Result Value Ref Range Status   Specimen Description   Final    BLOOD RIGHT CHEST PORT Performed at [facility]  [place] [address] [place], [place] [address]    Special Requests   Final    BOTTLES DRAWN AEROBIC AND ANAEROBIC Blood Culture results may not be optimal due to an inadequate volume of blood received in culture bottles Performed at [facility] [address], [place], [place] [address]    Culture  Setup Time   Final    GRAM POSITIVE COCCI IN CLUSTERS ANAEROBIC BOTTLE ONLY CRITICAL RESULT CALLED TO, READ BACK BY AND VERIFIED WITH: PHARMD T. GREEN [id number] FCP    Culture (A)  Final    STAPHYLOCOCCUS SPECIES (COAGULASE NEGATIVE) THE SIGNIFICANCE OF ISOLATING THIS ORGANISM FROM A SINGLE SET OF BLOOD CULTURES WHEN MULTIPLE SETS ARE DRAWN IS UNCERTAIN. PLEASE NOTIFY THE MICROBIOLOGY DEPARTMENT WITHIN ONE WEEK IF SPECIATION AND SENSITIVITIES ARE REQUIRED. Performed at [facility], [place] [address] [place], [place] [address]    Report Status 03/19/2019 FINAL  Final  SARS CORONAVIRUS 2 (TAT 6-24 HRS) Nasopharyngeal Nasopharyngeal Swab     Status: None   Collection Time: 03/16/19  4:25 PM   Specimen: Nasopharyngeal Swab  Result Value Ref Range Status   SARS Coronavirus 2 NEGATIVE NEGATIVE Final    Comment: (NOTE) SARS-CoV-2 target nucleic acids are NOT DETECTED. The SARS-CoV-2 RNA is generally detectable in upper  and lower respiratory specimens during the acute phase of infection. Negative results do not preclude SARS-CoV-2 infection, do not rule out co-infections with other pathogens, and should not be used as the sole basis for treatment or other patient management decisions. Negative results must be combined with clinical observations, patient history, and epidemiological information. The expected result is Negative. Fact Sheet for Patients: SugarRoll.be Fact Sheet for Healthcare Providers: https://www.woods-mathews.com/ This test is not yet approved or cleared by the [place] FDA and  has been authorized for detection and/or diagnosis of SARS-CoV-2 by FDA under an Emergency Use Authorization (EUA). This EUA will remain  in effect (meaning this test can be used) for the duration of the COVID-19 declaration under Section 56 4(b)(1) of the Act, 21 U.S.C. section 360bbb-3(b)(1), unless the authorization is terminated or revoked sooner. Performed at [facility], [place] [address], [place], [place] [address]          Radiology Studies: No results found.      Scheduled Meds: . Chlorhexidine Gluconate Cloth  6 each Topical Daily  . enoxaparin (LOVENOX) injection  40 mg Subcutaneous Q24H  . ferrous sulfate  325 mg Oral QODAY  . insulin aspart  0-5 Units Subcutaneous QHS  . insulin aspart  0-6 Units Subcutaneous TID WC  . insulin glargine  10 Units Subcutaneous Daily  . metoCLOPramide (REGLAN) injection  10 mg Intravenous Q6H  . mirtazapine  15 mg Oral QHS  . pantoprazole (PROTONIX) IV  40 mg Intravenous Q12H  . senna  1 tablet Oral BID  . sodium chloride flush  10-40 mL Intracatheter Q12H  . sodium chloride flush  3 mL Intravenous Q12H   Continuous Infusions: . lactated ringers 100 mL/hr at 03/23/19 0834     LOS: 7 days     Cordelia Poche, MD [facility] 03/23/2019, 9:00 AM  If 7PM-7AM, please contact  night-coverage [email or web address]

## 2019-03-23 NOTE — Consult Note (Signed)
Referring Provider:  Dr. Cordelia Poche Primary Care Physician:  Vicenta Aly, FNP Primary Gastroenterologist:  Dr. Paulita Fujita  Reason for Consultation: Recurrent nausea and vomiting  HPI: [name] is a 29 y.o. female with a roughly 4-year history of cyclical vomiting, associated over that time with perhaps a 15 pound weight loss.  She is a type I diabetic on an insulin pump.  She indicates that, over the past year, she went for about 6 months without any upper tract symptoms, able to eat normally.  However, the past 6 months have been very symptomatic, sometimes with nausea and vomiting for months at a time.  She has been on multiple medications including metoclopramide, Zofran, Phenergan, and erythromycin, but without consistent or sustained benefit.  Here in the hospital, she is getting frequent Dilaudid injections because of "pain all through her body" but in particular in her abdomen.  Today, she vomited once this morning but since then has been able to eat solid food without getting sick.  At this time, her dinner tray is about 80% consumed, including a hamburger.  A gastric emptying scan about 3 years ago showed significant delay of gastric emptying, 24% at 4 hours (normal being greater than 90%).  Her most recent endoscopy on record was 5 years ago, showing some diffuse gastritis.  More recently, she has been seen at [facility].  No history of THC usage and this is corroborated by multiple tox screens over the past 6 months during hospital admissions.   Past Medical History:  Diagnosis Date  . Anxiety   . Arthritis    "hands, feet, knees" (12/18/2016)  . Asthma   . Diabetic gastroparesis ([place])    Per gastric emptying study 07/09/16 which showed significant delayed gastric emptying.  . Gallstones   . Gastroparesis   . GERD (gastroesophageal reflux disease)   . Heart murmur   . Hepatic steatosis 11/26/2014   and hepatomegaly  . Hypertension    hx (12/18/2016)  .  Intractable cyclical vomiting syndrome    Archie Endo 12/18/2016  . Liver mass 11/26/2014  . Pancreatitis, acute 11/26/2014  . Pneumonia    "as a teen X 1" (12/18/2016)  . Type I diabetes mellitus ([place]) 2007   IDDM.  poorly controlled, multiple admits with DKA    Past Surgical History:  Procedure Laterality Date  . CHOLECYSTECTOMY N/A 02/11/2015   Procedure: LAPAROSCOPIC CHOLECYSTECTOMY WITH INTRAOPERATIVE CHOLANGIOGRAM;  Surgeon: Greer Pickerel, MD;  Location: [facility];  Service: General;  Laterality: N/A;  . ESOPHAGOGASTRODUODENOSCOPY (EGD) WITH PROPOFOL Left 09/20/2014   Procedure: ESOPHAGOGASTRODUODENOSCOPY (EGD) WITH PROPOFOL;  Surgeon: Arta Silence, MD;  Location: [facility] ENDOSCOPY;  Service: Endoscopy;  Laterality: Left;  . WISDOM TOOTH EXTRACTION      Prior to Admission medications   Medication Sig Start Date End Date Taking? Authorizing Provider  albuterol (PROVENTIL HFA;VENTOLIN HFA) 108 (90 Base) MCG/ACT inhaler Inhale 1-2 puffs into the lungs every 6 (six) hours as needed for wheezing or shortness of breath.    [provider]  blood glucose meter kit and supplies Dispense based on patient and insurance preference. Use up to four times daily as directed. (FOR ICD-10 E10.9, E11.9). 12/07/17   Eugenie Filler, MD  ferrous sulfate 324 (65 Fe) MG TBEC Take 1 tablet by mouth every other day. 02/08/19   [provider]  glucagon (GLUCAGON EMERGENCY) 1 MG injection Inject 1 mg into the muscle once as needed (For very low blood sugars. May repeat in 15-20 minutes if needed.).  12/20/16   Hongalgi, Lenis Dickinson, MD  Insulin Human (INSULIN PUMP) SOLN Inject 0-10 each into the skin continuous. Per sliding scale - Novolog 100 units/ml    [provider]  Insulin Pen Needle 31G X 5 MM MISC 30 Units by Does not apply route at bedtime. 12/07/17   Eugenie Filler, MD  lisinopril (ZESTRIL) 2.5 MG tablet Take 2.5 mg by mouth daily.    [provider]  metoCLOPramide (REGLAN) 10  MG tablet Take 1 tablet (10 mg total) by mouth in the morning and at bedtime. 03/15/19   Varney Biles, MD  pantoprazole (PROTONIX) 40 MG tablet Take 1 tablet (40 mg total) by mouth daily. 03/06/19 04/05/19  Elodia Florence., MD  promethazine (PHENERGAN) 25 MG suppository Place 1 suppository (25 mg total) rectally every 6 (six) hours as needed for nausea. 03/15/19   Varney Biles, MD  traZODone (DESYREL) 50 MG tablet Take 50 mg by mouth at bedtime.  11/30/18   [provider]    Current Facility-Administered Medications  Medication Dose Route Frequency Provider Last Rate Last Admin  . acetaminophen (TYLENOL) tablet 650 mg  650 mg Oral Q6H PRN Dahal, Marlowe Aschoff, MD       Or  . acetaminophen (TYLENOL) suppository 650 mg  650 mg Rectal Q6H PRN Dahal, Binaya, MD      . Chlorhexidine Gluconate Cloth 2 % PADS 6 each  6 each Topical Daily Dahal, Binaya, MD   6 each at 03/23/19 1000  . dextrose 50 % solution 50 mL  1 ampule Intravenous PRN Gardiner Barefoot, NP   50 mL at 03/22/19 2050  . enoxaparin (LOVENOX) injection 40 mg  40 mg Subcutaneous Q24H Dahal, Marlowe Aschoff, MD   40 mg at 03/18/19 2158  . ferrous sulfate tablet 325 mg  325 mg Oral Irena Reichmann, MD   325 mg at 03/18/19 0957  . HYDROmorphone (DILAUDID) injection 0.5 mg  0.5 mg Intravenous Q4H PRN Mariel Aloe, MD   0.5 mg at 03/23/19 1652  . insulin aspart (novoLOG) injection 0-5 Units  0-5 Units Subcutaneous QHS Terrilee Croak, MD   2 Units at 03/18/19 2158  . insulin aspart (novoLOG) injection 0-6 Units  0-6 Units Subcutaneous TID WC Mariel Aloe, MD   2 Units at 03/23/19 1236  . insulin glargine (LANTUS) injection 10 Units  10 Units Subcutaneous Daily Dahal, Marlowe Aschoff, MD   10 Units at 03/23/19 1228  . lactated ringers infusion   Intravenous Continuous Terrilee Croak, MD 100 mL/hr at 03/23/19 0834 New Bag at 03/23/19 0834  . magnesium hydroxide (MILK OF MAGNESIA) suspension 30 mL  30 mL Oral Daily PRN Dahal, Binaya, MD       . metoCLOPramide (REGLAN) injection 10 mg  10 mg Intravenous Q6H Dahal, Binaya, MD   10 mg at 03/23/19 1808  . mirtazapine (REMERON) tablet 15 mg  15 mg Oral QHS Terrilee Croak, MD   15 mg at 03/22/19 2211  . ondansetron (ZOFRAN) tablet 4 mg  4 mg Oral Q6H PRN Dahal, Marlowe Aschoff, MD   4 mg at 03/16/19 1806   Or  . ondansetron (ZOFRAN) injection 4 mg  4 mg Intravenous Q6H PRN Terrilee Croak, MD   4 mg at 03/23/19 1652  . pantoprazole (PROTONIX) injection 40 mg  40 mg Intravenous Q12H Mariel Aloe, MD   40 mg at 03/23/19 1227  . polyethylene glycol (MIRALAX / GLYCOLAX) packet 17 g  17 g Oral Daily PRN Dahal, Binaya,  MD   17 g at 03/19/19 1232  . promethazine (PHENERGAN) injection 25 mg  25 mg Intravenous Q4H PRN Terrilee Croak, MD   25 mg at 03/23/19 1227  . senna (SENOKOT) tablet 8.6 mg  1 tablet Oral BID Terrilee Croak, MD   8.6 mg at 03/23/19 1227  . sodium chloride flush (NS) 0.9 % injection 10-40 mL  10-40 mL Intracatheter Q12H Mariel Aloe, MD   10 mL at 03/22/19 2101  . sodium chloride flush (NS) 0.9 % injection 10-40 mL  10-40 mL Intracatheter PRN Mariel Aloe, MD      . sodium chloride flush (NS) 0.9 % injection 3 mL  3 mL Intravenous Q12H Dahal, Marlowe Aschoff, MD   3 mL at 03/20/19 1033  . sodium phosphate (FLEET) 7-19 GM/118ML enema 1 enema  1 enema Rectal Once PRN Dahal, Binaya, MD      . sorbitol 70 % solution 30 mL  30 mL Oral Daily PRN Terrilee Croak, MD        Allergies as of 03/16/2019 - Review Complete 03/16/2019  Allergen Reaction Noted  . Peanut-containing drug products Swelling and Other (See Comments) 12/04/2011  . Food Swelling and Other (See Comments) 05/13/2016  . Ultram [tramadol] Itching 06/23/2015    Family History  Problem Relation Age of Onset  . Heart disease Maternal Grandmother   . Heart disease Maternal Grandfather   . Diabetes Mother   . Hyperlipidemia Mother   . Hypertension Father   . Heart disease Father   . Hypertension Paternal Grandmother   . Cancer  Paternal Grandfather     Social History   Socioeconomic History  . Marital status: Single    Spouse name: Not on file  . Number of children: 1  . Years of education: 47  . Highest education level: Not on file  Occupational History  . Occupation: Unemployed.  Tobacco Use  . Smoking status: Never Smoker  . Smokeless tobacco: Never Used  Substance and Sexual Activity  . Alcohol use: No  . Drug use: Not Currently    Types: Marijuana  . Sexual activity: Never  Other Topics Concern  . Not on file  Social History Narrative   Lives with her mother.  Unemployed.  Trying to qualify for disability.   Social Determinants of Health   Financial Resource Strain:   . Difficulty of Paying Living Expenses: Not on file  Food Insecurity:   . Worried About Charity fundraiser in the Last Year: Not on file  . Ran Out of Food in the Last Year: Not on file  Transportation Needs:   . Lack of Transportation (Medical): Not on file  . Lack of Transportation (Non-Medical): Not on file  Physical Activity:   . Days of Exercise per Week: Not on file  . Minutes of Exercise per Session: Not on file  Stress:   . Feeling of Stress : Not on file  Social Connections:   . Frequency of Communication with Friends and Family: Not on file  . Frequency of Social Gatherings with Friends and Family: Not on file  . Attends Religious Services: Not on file  . Active Member of Clubs or Organizations: Not on file  . Attends Archivist Meetings: Not on file  . Marital Status: Not on file  Intimate Partner Violence:   . Fear of Current or Ex-Partner: Not on file  . Emotionally Abused: Not on file  . Physically Abused: Not on file  . Sexually  Abused: Not on file     Physical Exam: Vital signs in last 24 hours: Temp:  [98.2 F (36.8 C)-98.5 F (36.9 C)] 98.2 F (36.8 C) (03/04 2105) Pulse Rate:  [88-97] 97 (03/04 2105) Resp:  [15] 15 (03/04 2105) BP: (106-121)/(75-93) 121/93 (03/04 2105) SpO2:   [98 %-100 %] 100 % (03/04 2105) Last BM Date: 03/16/19  This is a pleasant, thin but not cachectic appearing African-American female with multiple tattoos.  Chest clear, heart normal.  Abdomen has normal active bowel sounds, no succussion splash, no distention, no mass-effect or tenderness.  No peripheral edema.  No evident focal neurologic deficits.  Mood is normal, no overt anxiety or depression.  Intake/Output from previous day: 03/03 0701 - 03/04 0700 In: 2140.1 [I.V.:2140.1] Out: -  Intake/Output this shift: No intake/output data recorded.  Lab Results: No results for input(s): WBC, HGB, HCT, PLT in the last 72 hours. BMET Recent Labs    03/21/19 0611 03/22/19 0542 03/23/19 0556  NA 137 136 138  K 3.8 3.3* 3.4*  CL 104 100 101  CO2 _0 GLUCOSE 255* 155* 124*  BUN _1 CREATININE 0.72 0.58 0.58  CALCIUM 8.5* 8.3* 8.3*   LFT No results for input(s): PROT, ALBUMIN, AST, ALT, ALKPHOS, BILITOT, BILIDIR, IBILI in the last 72 hours. PT/INR No results for input(s): LABPROT, INR in the last 72 hours.  Studies/Results: No results found.  Impression: Cyclical vomiting of 4 years duration in a type I diabetic.  My best guess is that this is not diabetic gastroparesis, since the patient has had very long symptom-free episodes.  Rather, it sounds more like cyclical vomiting syndrome, most commonly seen in young females.  Plan: 1.  I would advocate minimization of use of opiate analgesics, which inhibit gastric motility.  Rationale discussed with patient.  Could try using a K pad since radiant heat is often quite effective for abdominal/visceral pain.  2.  Patient was instructed in a gastroparesis diet using frequent small aliquots of liquids, when she is symptomatic.  Since she is currently tolerating solid food, I see no need to institute that at the present time, but it could be used for home therapy to hopefully prevent ER visits and hospitalizations by preventing  dehydration.  3.  At present, I do not think there would be likely benefit to doing updated testing such as upper endoscopy or a gastric emptying scan.  For now, at least, I would tend to follow the patient clinically.  4.  Given the absence of specific suggestions beyond those mentioned above, we will plan to follow the patient at a distance, so please call us at any time if more immediate input is desired.   LOS: 7 days   [place]  03/23/2019, 9:45 PM   Pager [phone] If no answer or after 5 PM call [phone]

## 2019-03-24 LAB — GLUCOSE, CAPILLARY
Glucose-Capillary: 197 mg/dL — ABNORMAL HIGH (ref 70–99)
Glucose-Capillary: 209 mg/dL — ABNORMAL HIGH (ref 70–99)
Glucose-Capillary: 90 mg/dL (ref 70–99)
Glucose-Capillary: 217 mg/dL — ABNORMAL HIGH (ref 70–99)

## 2019-03-24 MED ORDER — INSULIN ASPART 100 UNIT/ML ~~LOC~~ SOLN
1.0000 [IU] | Freq: Three times a day (TID) | SUBCUTANEOUS | Status: DC
  Administered 2019-03-24 – 2019-03-25 (×2): 1 [IU] via SUBCUTANEOUS

## 2019-03-24 MED ORDER — HYDROCODONE-ACETAMINOPHEN 5-325 MG PO TABS
1.0000 | ORAL_TABLET | ORAL | Status: DC | PRN
  Administered 2019-03-24 (×2): 1 via ORAL
  Administered 2019-03-25 – 2019-03-26 (×4): 2 via ORAL
  Administered 2019-03-26: 1 via ORAL
  Administered 2019-03-26 – 2019-03-27 (×2): 2 via ORAL
  Filled 2019-03-24: qty 1
  Filled 2019-03-24 (×2): qty 2
  Filled 2019-03-24: qty 1
  Filled 2019-03-24 (×7): qty 2

## 2019-03-24 MED ORDER — HYDROMORPHONE HCL 1 MG/ML IJ SOLN
0.5000 mg | INTRAMUSCULAR | Status: DC | PRN
  Administered 2019-03-24 – 2019-03-25 (×4): 0.5 mg via INTRAVENOUS
  Filled 2019-03-24 (×5): qty 0.5

## 2019-03-24 NOTE — Progress Notes (Addendum)
PROGRESS NOTE    [name]  [id number] DOB: 10/04/1990 DOA: 03/16/2019 PCP: Vicenta Aly, FNP   Brief Narrative: [name] is a 29 y.o. medical history with T1DM on insulin pump, GERD, asthma, diabetic gastroparesis/intractable cyclical vomiting, hypertension, history of previous DKA, pancreatitis. Patient presented secondary to nausea/vomiting with concern for gastroparesis flare. Currently on antiemetics, IV fluids and IV analgesics.   Assessment & Plan:   Active Problems:   Diabetic gastroparesis (HCC)   Intractable nausea and vomiting   Diabetes mellitus (HCC)   Intractable nausea and vomiting Secondary to gastroparesis. Several admissions/ED visits for similar symptoms. Patient is supposed to be following up with [facility] but is never well enough to get there. I discussed that when she is improved, she should promptly attempt follow-up with her doctors at [facility]. No vomiting overnight. Tolerated her dinner. It is possible some of her symptoms may be related to hypoglycemia. -Continue Reglan IV scheduled and Phenergan IV prn -Advance diet to soft  Abdominal pain Secondary to above. -Norco prn -Dilaudid for breakthrough only  Diabetes mellitus, type 1 Uncontrolled with hyperglycemia and hypoglycemia. Insulin pump off. -Continue Lantus 10 units daily and continue very sensitive SSI -Novolog 1 unit TID WC  Essential hypertension Patient is on lisinopril as an outpatient. Held secondary to poor oral tolerance  Asthma Stable. No wheezing  Neurogenic bladder Patient follows with urology. Patient self catheterizes. -Continue CIC q8 hours  Iron deficiency anemia Chronic. Stable. Patient is on iron supplementation as an outpatient. -Continue iron supplementation as able  Constipation -Suppository, MiraLAX, Senakot-S   DVT prophylaxis: SCDs Code Status:   Code Status: Full Code Family Communication: None at bedside Disposition Plan: Discharge pending  improvement of nausea/vomiting and ability to tolerate oral nutrition   Consultants:   Gastroenterology  Procedures:   None  Antimicrobials:  None   Subjective: Better night. No vomiting overnight.   Objective: Vitals:   03/22/19 2033 03/23/19 1348 03/23/19 2105 03/24/19 0605  BP: 109/80 106/75 (!) 121/93 123/81  Pulse: 91 88 97 84  Resp: 14 15 15 16   Temp: 98.4 F (36.9 C) 98.5 F (36.9 C) 98.2 F (36.8 C) 98.3 F (36.8 C)  TempSrc: Oral Oral Oral Oral  SpO2: 100% 98% 100% 96%  Weight:      Height:        Intake/Output Summary (Last 24 hours) at 03/24/2019 1105 Last data filed at 03/24/2019 0300 Gross per 24 hour  Intake 2095.35 ml  Output --  Net 2095.35 ml   Filed Weights   03/17/19 0537  Weight: 51.6 kg    Examination:  General exam: Appears calm and comfortable Respiratory system: Clear to auscultation. Respiratory effort normal. Cardiovascular system: S1 & S2 heard, RRR. No murmurs, rubs, gallops or clicks. Gastrointestinal system: Abdomen is nondistended, soft and nontender. No organomegaly or masses felt. Normal bowel sounds heard. Central nervous system: Alert and oriented. No focal neurological deficits. Extremities: No edema. No calf tenderness Skin: No cyanosis. No rashes Psychiatry: Judgement and insight appear normal. Mood & affect appropriate.     Data Reviewed: I have personally reviewed following labs and imaging studies  CBC: Recent Labs  Lab 03/18/19 0606 03/19/19 0650  WBC 5.5 4.7  HGB 8.0* 8.1*  HCT 26.5* 26.4*  MCV 87.5 86.6  PLT 380 [id number]   Basic Metabolic Panel: Recent Labs  Lab 03/18/19 0606 03/19/19 0650 03/21/19 0611 03/22/19 0542 03/23/19 0556  NA 139 136 137 136 138  K 3.6 3.5 3.8 3.3* 3.4*  CL 109 104 104 100 101  CO2 22 26 24 28 29   GLUCOSE 83 212* 255* 155* 124*  BUN 13 8 13 8 7   CREATININE 0.69 0.63 0.72 0.58 0.58  CALCIUM 8.3* 8.2* 8.5* 8.3* 8.3*  MG  --   --  1.6*  --   --   PHOS  --   --  3.3  --    --    GFR: Estimated Creatinine Clearance: 84.5 mL/min (by C-G formula based on SCr of 0.58 mg/dL). Liver Function Tests: No results for input(s): AST, ALT, ALKPHOS, BILITOT, PROT, ALBUMIN in the last 168 hours. No results for input(s): LIPASE, AMYLASE in the last 168 hours. No results for input(s): AMMONIA in the last 168 hours. Coagulation Profile: No results for input(s): INR, PROTIME in the last 168 hours. Cardiac Enzymes: No results for input(s): CKTOTAL, CKMB, CKMBINDEX, TROPONINI in the last 168 hours. BNP (last 3 results) No results for input(s): PROBNP in the last 8760 hours. HbA1C: No results for input(s): HGBA1C in the last 72 hours. CBG: Recent Labs  Lab 03/23/19 0734 03/23/19 1150 03/23/19 1621 03/23/19 2106 03/24/19 0736  GLUCAP 138* 215* 115* 209* 197*   Lipid Profile: No results for input(s): CHOL, HDL, LDLCALC, TRIG, CHOLHDL, LDLDIRECT in the last 72 hours. Thyroid Function Tests: No results for input(s): TSH, T4TOTAL, FREET4, T3FREE, THYROIDAB in the last 72 hours. Anemia Panel: No results for input(s): VITAMINB12, FOLATE, FERRITIN, TIBC, IRON, RETICCTPCT in the last 72 hours. Sepsis Labs: No results for input(s): PROCALCITON, LATICACIDVEN in the last 168 hours.  Recent Results (from the past 240 hour(s))  Urine culture     Status: Abnormal   Collection Time: 03/16/19 11:43 AM   Specimen: Urine, Random  Result Value Ref Range Status   Specimen Description   Final    URINE, RANDOM Performed at [facility] [address] [place], [place] [address]    Special Requests   Final    NONE Performed at [facility], [place] [address], [place], [place] [address]    Culture (A)  Final    70,000 COLONIES/mL CORYNEBACTERIUM SPECIES Standardized susceptibility testing for this organism is not available. Performed at [facility], [place] [address] [place], [place] [address]    Report Status 03/17/2019 FINAL  Final   Culture, blood (routine x 2)     Status: Abnormal   Collection Time: 03/16/19 12:30 PM   Specimen: BLOOD  Result Value Ref Range Status   Specimen Description   Final    BLOOD RIGHT CHEST PORT Performed at [facility] [address], [place], [place] [address]    Special Requests   Final    BOTTLES DRAWN AEROBIC AND ANAEROBIC Blood Culture results may not be optimal due to an inadequate volume of blood received in culture bottles Performed at [facility] [address] [place], [place] [address]    Culture  Setup Time   Final    GRAM POSITIVE COCCI IN CLUSTERS ANAEROBIC BOTTLE ONLY CRITICAL RESULT CALLED TO, READ BACK BY AND VERIFIED WITH: PHARMD T. GREEN [id number] FCP    Culture (A)  Final    STAPHYLOCOCCUS SPECIES (COAGULASE NEGATIVE) THE SIGNIFICANCE OF ISOLATING THIS ORGANISM FROM A SINGLE SET OF BLOOD CULTURES WHEN MULTIPLE SETS ARE DRAWN IS UNCERTAIN. PLEASE NOTIFY THE MICROBIOLOGY DEPARTMENT WITHIN ONE WEEK IF SPECIATION AND SENSITIVITIES ARE REQUIRED. Performed at [facility], [place] [address] [place], [place]  [id number]    Report Status 03/19/2019 FINAL  Final  SARS CORONAVIRUS 2 (TAT 6-24 HRS) Nasopharyngeal Nasopharyngeal Swab     Status: None   Collection Time: 03/16/19  4:25 PM   Specimen: Nasopharyngeal Swab  Result Value Ref Range Status   SARS Coronavirus 2 NEGATIVE NEGATIVE Final    Comment: (NOTE) SARS-CoV-2 target nucleic acids are NOT DETECTED. The SARS-CoV-2 RNA is generally detectable in upper and lower respiratory specimens during the acute phase of infection. Negative results do not preclude SARS-CoV-2 infection, do not rule out co-infections with other pathogens, and should not be used as the sole basis for treatment or other patient management decisions. Negative results must be combined with clinical observations, patient history, and epidemiological information. The expected result is Negative. Fact  Sheet for Patients: SugarRoll.be Fact Sheet for Healthcare Providers: https://www.woods-mathews.com/ This test is not yet approved or cleared by the [place] FDA and  has been authorized for detection and/or diagnosis of SARS-CoV-2 by FDA under an Emergency Use Authorization (EUA). This EUA will remain  in effect (meaning this test can be used) for the duration of the COVID-19 declaration under Section 56 4(b)(1) of the Act, 21 U.S.C. section 360bbb-3(b)(1), unless the authorization is terminated or revoked sooner. Performed at [facility], [place] [address], [place], [place] [address]          Radiology Studies: No results found.      Scheduled Meds: . Chlorhexidine Gluconate Cloth  6 each Topical Daily  . enoxaparin (LOVENOX) injection  40 mg Subcutaneous Q24H  . ferrous sulfate  325 mg Oral QODAY  . insulin aspart  0-5 Units Subcutaneous QHS  . insulin aspart  0-6 Units Subcutaneous TID WC  . insulin glargine  10 Units Subcutaneous Daily  . metoCLOPramide (REGLAN) injection  10 mg Intravenous Q6H  . mirtazapine  15 mg Oral QHS  . pantoprazole (PROTONIX) IV  40 mg Intravenous Q12H  . senna  1 tablet Oral BID  . sodium chloride flush  10-40 mL Intracatheter Q12H  . sodium chloride flush  3 mL Intravenous Q12H   Continuous Infusions: . lactated ringers 100 mL/hr at 03/24/19 0550     LOS: 8 days     Cordelia Poche, MD [facility] 03/24/2019, 11:05 AM  If 7PM-7AM, please contact night-coverage [email or web address]

## 2019-03-24 NOTE — TOC Initial Note (Signed)
Transition of Care (TOC) - Initial/Assessment Note    Patient Details  Name: [name] MRN: [id number] Date of Birth: 01/16/1991  Transition of Care [facility]) CM/SW Contact:    Farhana Fellows, Marjie Skiff, RN Phone Number: 03/24/2019, 12:44 PM  Clinical Narrative:                   Expected Discharge Plan: Home/Self Care Barriers to Discharge: Continued Medical Work up   Expected Discharge Plan and Services Expected Discharge Plan: Home/Self Care       Living arrangements for the past 2 months: Single Family Home Expected Discharge Date: (unknown)                   Prior Living Arrangements/Services Living arrangements for the past 2 months: Single Family Home Lives with:: Minor Children, Parents                   Activities of Daily Living Home Assistive Devices/Equipment: Insulin Pump, CBG Meter, Eyeglasses ADL Screening (condition at time of admission) Patient's cognitive ability adequate to safely complete daily activities?: Yes Is the patient deaf or have difficulty hearing?: No Does the patient have difficulty seeing, even when wearing glasses/contacts?: No Does the patient have difficulty concentrating, remembering, or making decisions?: No Patient able to express need for assistance with ADLs?: Yes Does the patient have difficulty dressing or bathing?: No Independently performs ADLs?: Yes (appropriate for developmental age) Does the patient have difficulty walking or climbing stairs?: No Weakness of Legs: None Weakness of Arms/Hands: None         Admission diagnosis:  Dehydration [E86.0] Diabetic gastroparesis ([place]) [E11.43, K31.84] Neurogenic bladder [N31.9] Acute cystitis without hematuria [N30.00] Intractable nausea and vomiting [R11.2] Patient Active Problem List   Diagnosis Date Noted  . Intractable cyclical vomiting with nausea 02/26/2019  . Gastroparesis due to secondary diabetes ([place]) 11/21/2018  . Diabetes mellitus ([place]) 11/13/2018  . HTN  (hypertension) 11/13/2018  . Marijuana abuse 11/13/2018  . Acute pyelonephritis 10/07/2018  . Hydronephrosis of right kidney 10/05/2018  . Diffuse pain   . Elevated blood pressure reading 12/03/2017  . Tachycardia 11/30/2017  . Hypernatremia 11/02/2017  . Nausea and vomiting 01/22/2017  . Nausea & vomiting 01/21/2017  . Intractable cyclical vomiting syndrome 12/18/2016  . Leukocytosis 10/24/2016  . N&V (nausea and vomiting) 10/23/2016  . DKA, type 1 ([place]) 09/26/2016  . Thrombocytosis ([place]) 07/08/2016  . Candiduria, asymptomatic 06/23/2016  . Hyponatremia 06/13/2016  . Hematuria 05/13/2016  . UTI (urinary tract infection) 01/22/2016  . Diarrhea 11/09/2015  . Acute urinary retention   . Gastroparesis due to DM ([place]) 07/10/2015  . GERD (gastroesophageal reflux disease) 07/10/2015  . Depression with anxiety 07/10/2015  . Gastroparesis 06/22/2015  . Altered mental status 06/22/2015  . Volume depletion 06/10/2015  . Protein-calorie malnutrition, severe 06/10/2015  . Hyperglycemia   . Elevated bilirubin   . Hematemesis with nausea   . Intractable nausea and vomiting 05/28/2015  . Abdominal pain in female   . Abdominal pain 05/24/2015  . Hypertension 05/24/2015  . Dehydration   . Diabetic gastroparesis ([place])   . Chronic diastolic heart failure ([place]) 04/11/2015  . Hematemesis 04/08/2015  . DKA (diabetic ketoacidoses) ([place]) 03/22/2015  . S/P laparoscopic cholecystectomy 02/11/2015  . Postextubation stridor   . Pancreatitis, acute 11/26/2014  . Volume overload 11/26/2014  . Hypokalemia 11/26/2014  . Hepatic steatosis 11/26/2014  . Liver mass 11/26/2014  . Sepsis ([place]) 11/25/2014  . Sinus tachycardia 11/25/2014  . Hypomagnesemia  11/25/2014  . Hypophosphatemia 11/25/2014  . Elevated LFTs 11/24/2014  . AKI (acute kidney injury) ([place]) 11/24/2014  . Migraine headache 11/24/2014  . Asthma 06/29/2012  . Uncontrolled type 1 diabetes mellitus ([place]) 06/19/2010  . Goiter,  unspecified 06/19/2010   PCP:  Vicenta Aly, [place] Pharmacy:   [id number] - [place], [place] [address] [place] [address] Phone: [phone] Fax: [phone]     Social Determinants of Health ([place]) Interventions    Readmission Risk Interventions Readmission Risk Prevention Plan 03/24/2019 03/03/2019 02/23/2019  Transportation Screening Complete Complete Complete  Medication Review (RN Care Manager) Complete Complete Complete  PCP or Specialist appointment within 3-5 days of discharge Complete Complete Not Complete  PCP/Specialist Appt Not Complete comments - Patient next appointment is scheduled for 2/16. got earliest apointment avaliable which is 2/16-which is 9 business days from d/c date  [place] or [place] Complete Complete Complete  HRI or Home Care Consult Pt Refusal Comments - - -  SW Recovery Care/Counseling Consult Complete Complete Complete  SW Consult Not Complete Comments - - -  Palliative Care Screening Not Applicable Not Applicable Not Applicable  Comments - - -  [place] Not Applicable Not Applicable Not Applicable  Some recent data might be hidden

## 2019-03-25 LAB — BASIC METABOLIC PANEL
Anion gap: 5 (ref 5–15)
BUN: <5 mg/dL — ABNORMAL LOW (ref 6–20)
CO2: 29 mmol/L (ref 22–32)
Calcium: 8.1 mg/dL — ABNORMAL LOW (ref 8.9–10.3)
Chloride: 100 mmol/L (ref 98–111)
Creatinine, Ser: 0.59 mg/dL (ref 0.44–1.00)
GFR calc Af Amer: >60 mL/min (ref >60)
GFR calc non Af Amer: >60 mL/min (ref >60)
Glucose, Bld: 263 mg/dL — ABNORMAL HIGH (ref 70–99)
Potassium: 3.5 mmol/L (ref 3.5–5.1)
Sodium: 134 mmol/L — ABNORMAL LOW (ref 135–145)

## 2019-03-25 LAB — GLUCOSE, CAPILLARY
Glucose-Capillary: 184 mg/dL — ABNORMAL HIGH (ref 70–99)
Glucose-Capillary: 243 mg/dL — ABNORMAL HIGH (ref 70–99)
Glucose-Capillary: 378 mg/dL — ABNORMAL HIGH (ref 70–99)
Glucose-Capillary: 120 mg/dL — ABNORMAL HIGH (ref 70–99)
Glucose-Capillary: 63 mg/dL — ABNORMAL LOW (ref 70–99)

## 2019-03-25 LAB — CBC
HCT: 29.4 % — ABNORMAL LOW (ref 36.0–46.0)
Hemoglobin: 9 g/dL — ABNORMAL LOW (ref 12.0–15.0)
MCH: 26.6 pg (ref 26.0–34.0)
MCHC: 30.6 g/dL (ref 30.0–36.0)
MCV: 87 fL (ref 80.0–100.0)
Platelets: 361 10*3/uL (ref 150–400)
RBC: 3.38 MIL/uL — ABNORMAL LOW (ref 3.87–5.11)
RDW: 16 % — ABNORMAL HIGH (ref 11.5–15.5)
WBC: 5.9 10*3/uL (ref 4.0–10.5)
nRBC: 0 % (ref 0.0–0.2)

## 2019-03-25 MED ORDER — INSULIN ASPART 100 UNIT/ML ~~LOC~~ SOLN
2.0000 [IU] | Freq: Three times a day (TID) | SUBCUTANEOUS | Status: DC
  Administered 2019-03-25 – 2019-04-05 (×8): 2 [IU] via SUBCUTANEOUS

## 2019-03-25 MED ORDER — HYDROMORPHONE HCL 1 MG/ML IJ SOLN
0.5000 mg | Freq: Three times a day (TID) | INTRAMUSCULAR | Status: DC | PRN
  Administered 2019-03-25 – 2019-03-28 (×9): 0.5 mg via INTRAVENOUS
  Filled 2019-03-25 (×8): qty 0.5

## 2019-03-25 NOTE — Progress Notes (Addendum)
Patient reports several episodes of vomiting last night.    Patient remains on continuous, frequent dosing of opiate analgesics.  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  On exam, the patient is lying in bed in absolutely no distress.  Recommendations:  1.  I will ask that the nurses record the volume, frequency, and appearance of all emeses  2.  Patient reminded to avoid vegetables for now, and instead, have frequent small snacks, especially of starchy foods which are easy to digest from the gastric motility perspective.  3.  I would strongly recommend gradually tapering narcotics to zero, given the absence of any objective identifiable source of abdominal pain within the abdomen.  This would very possibly have the twofold benefit of (paradoxically) reducing the patient's abdominal pain, and improving her gastric motility and thereby reducing her nausea and vomiting. Discussed with Dr. Lonny Prude.  Interestingly, patient seemed quite open to this approach--she did not exhibit hostility or resistance as is sometimes seen in drug-seeking patients when decreases in their opiates are being proposed.  Cleotis Nipper, M.D. Pager [phone] If no answer or after 5 PM call [phone]

## 2019-03-25 NOTE — Progress Notes (Addendum)
PROGRESS NOTE    [name]  [id number] DOB: 31-Jul-1990 DOA: 03/16/2019 PCP: Vicenta Aly, FNP   Brief Narrative: [name] is a 29 y.o. medical history with T1DM on insulin pump, GERD, asthma, diabetic gastroparesis/intractable cyclical vomiting, hypertension, history of previous DKA, pancreatitis. Patient presented secondary to nausea/vomiting with concern for gastroparesis flare. Currently on antiemetics, IV fluids and IV analgesics.   Assessment & Plan:   Active Problems:   Diabetic gastroparesis (HCC)   Intractable nausea and vomiting   Diabetes mellitus (HCC)   Intractable nausea and vomiting Secondary to gastroparesis. Several admissions/ED visits for similar symptoms. Patient is supposed to be following up with [facility] but is never well enough to get there. I discussed that when she is improved, she should promptly attempt follow-up with her doctors at [facility]. Had recurrent emesis overnight unfortunately. -Continue Reglan IV scheduled and Phenergan IV prn -Continue soft diet  Abdominal pain Secondary to above. -Norco prn -Dilaudid for breakthrough and if not tolerating oral intake  Diabetes mellitus, type 1 Uncontrolled with hyperglycemia and hypoglycemia. Insulin pump off. -Continue Lantus 10 units daily and continue very sensitive SSI -Novolog 2 unit TID WC  Essential hypertension Patient is on lisinopril as an outpatient. Held secondary to poor oral tolerance  Asthma Stable. No wheezing  Neurogenic bladder Patient follows with urology. Patient self catheterizes. -Continue CIC q8 hours  Iron deficiency anemia Chronic. Stable. Patient is on iron supplementation as an outpatient. -Continue iron supplementation as able  Constipation -Suppository, MiraLAX, Senakot-S   DVT prophylaxis: SCDs Code Status:   Code Status: Full Code Family Communication: None at bedside Disposition Plan: Discharge pending improvement of nausea/vomiting and  ability to tolerate oral nutrition   Consultants:   Gastroenterology  Procedures:   None  Antimicrobials:  None   Subjective: Unfortunately had a set back yesterday afternoon/evening with emesis. She has been receiving IV analgesics because of recurrent emesis.  Objective: Vitals:   03/24/19 0605 03/24/19 1349 03/24/19 2110 03/25/19 0517  BP: 123/81 (!) 155/113 (!) 155/87 (!) 126/92  Pulse: 84 (!) 102 (!) 102 80  Resp: 16 16 14 19   Temp: 98.3 F (36.8 C) 99 F (37.2 C) 98.9 F (37.2 C) 98.4 F (36.9 C)  TempSrc: Oral Oral Oral Oral  SpO2: 96% 100% 98% 96%  Weight:      Height:        Intake/Output Summary (Last 24 hours) at 03/25/2019 0901 Last data filed at 03/24/2019 1300 Gross per 24 hour  Intake --  Output 400 ml  Net -400 ml   Filed Weights   03/17/19 0537  Weight: 51.6 kg    Examination:  General exam: Appears calm and comfortable Respiratory system: Clear to auscultation. Respiratory effort normal. Cardiovascular system: S1 & S2 heard, RRR. No murmurs, rubs, gallops or clicks. Gastrointestinal system: Abdomen is nondistended, soft and nontender. No organomegaly or masses felt. Normal bowel sounds heard. Central nervous system: Alert and oriented. No focal neurological deficits. Extremities: No edema. No calf tenderness Skin: No cyanosis. No rashes Psychiatry: Judgement and insight appear normal. Mood & affect appropriate.     Data Reviewed: I have personally reviewed following labs and imaging studies  CBC: Recent Labs  Lab 03/19/19 0650  WBC 4.7  HGB 8.1*  HCT 26.4*  MCV 86.6  PLT 99991111   Basic Metabolic Panel: Recent Labs  Lab 03/19/19 0650 03/21/19 0611 03/22/19 0542 03/23/19 0556  NA 136 137 136 138  K 3.5 3.8 3.3* 3.4*  CL 104 104  100 101  CO2 26 24 28 29   GLUCOSE 212* 255* 155* 124*  BUN 8 13 8 7   CREATININE 0.63 0.72 0.58 0.58  CALCIUM 8.2* 8.5* 8.3* 8.3*  MG  --  1.6*  --   --   PHOS  --  3.3  --   --     GFR: Estimated Creatinine Clearance: 84.5 mL/min (by C-G formula based on SCr of 0.58 mg/dL). Liver Function Tests: No results for input(s): AST, ALT, ALKPHOS, BILITOT, PROT, ALBUMIN in the last 168 hours. No results for input(s): LIPASE, AMYLASE in the last 168 hours. No results for input(s): AMMONIA in the last 168 hours. Coagulation Profile: No results for input(s): INR, PROTIME in the last 168 hours. Cardiac Enzymes: No results for input(s): CKTOTAL, CKMB, CKMBINDEX, TROPONINI in the last 168 hours. BNP (last 3 results) No results for input(s): PROBNP in the last 8760 hours. HbA1C: No results for input(s): HGBA1C in the last 72 hours. CBG: Recent Labs  Lab 03/24/19 0736 03/24/19 1127 03/24/19 1652 03/24/19 2030 03/25/19 0802  GLUCAP 197* 209* 90 217* 184*   Lipid Profile: No results for input(s): CHOL, HDL, LDLCALC, TRIG, CHOLHDL, LDLDIRECT in the last 72 hours. Thyroid Function Tests: No results for input(s): TSH, T4TOTAL, FREET4, T3FREE, THYROIDAB in the last 72 hours. Anemia Panel: No results for input(s): VITAMINB12, FOLATE, FERRITIN, TIBC, IRON, RETICCTPCT in the last 72 hours. Sepsis Labs: No results for input(s): PROCALCITON, LATICACIDVEN in the last 168 hours.  Recent Results (from the past 240 hour(s))  Urine culture     Status: Abnormal   Collection Time: 03/16/19 11:43 AM   Specimen: Urine, Random  Result Value Ref Range Status   Specimen Description   Final    URINE, RANDOM Performed at [facility] [address], [place], [place] [address]    Special Requests   Final    NONE Performed at [facility], [place] [address], [place], [place] [address]    Culture (A)  Final    70,000 COLONIES/mL CORYNEBACTERIUM SPECIES Standardized susceptibility testing for this organism is not available. Performed at [facility], [place] [address] [place], [place] [address]    Report Status 03/17/2019 FINAL  Final  Culture,  blood (routine x 2)     Status: Abnormal   Collection Time: 03/16/19 12:30 PM   Specimen: BLOOD  Result Value Ref Range Status   Specimen Description   Final    BLOOD RIGHT CHEST PORT Performed at [facility] [address], [place], [place] [address]    Special Requests   Final    BOTTLES DRAWN AEROBIC AND ANAEROBIC Blood Culture results may not be optimal due to an inadequate volume of blood received in culture bottles Performed at [facility] [address], [place], [place] [address]    Culture  Setup Time   Final    GRAM POSITIVE COCCI IN CLUSTERS ANAEROBIC BOTTLE ONLY CRITICAL RESULT CALLED TO, READ BACK BY AND VERIFIED WITH: PHARMD T. GREEN [id number] FCP    Culture (A)  Final    STAPHYLOCOCCUS SPECIES (COAGULASE NEGATIVE) THE SIGNIFICANCE OF ISOLATING THIS ORGANISM FROM A SINGLE SET OF BLOOD CULTURES WHEN MULTIPLE SETS ARE DRAWN IS UNCERTAIN. PLEASE NOTIFY THE MICROBIOLOGY DEPARTMENT WITHIN ONE WEEK IF SPECIATION AND SENSITIVITIES ARE REQUIRED. Performed at [facility], [place] [address] [place], [place] [address]    Report Status 03/19/2019 FINAL  Final  SARS CORONAVIRUS 2 (TAT  6-24 HRS) Nasopharyngeal Nasopharyngeal Swab     Status: None   Collection Time: 03/16/19  4:25 PM   Specimen: Nasopharyngeal Swab  Result Value Ref Range Status   SARS Coronavirus 2 NEGATIVE NEGATIVE Final    Comment: (NOTE) SARS-CoV-2 target nucleic acids are NOT DETECTED. The SARS-CoV-2 RNA is generally detectable in upper and lower respiratory specimens during the acute phase of infection. Negative results do not preclude SARS-CoV-2 infection, do not rule out co-infections with other pathogens, and should not be used as the sole basis for treatment or other patient management decisions. Negative results must be combined with clinical observations, patient history, and epidemiological information. The expected result is Negative. Fact Sheet for  Patients: SugarRoll.be Fact Sheet for Healthcare Providers: https://www.woods-mathews.com/ This test is not yet approved or cleared by the [place] FDA and  has been authorized for detection and/or diagnosis of SARS-CoV-2 by FDA under an Emergency Use Authorization (EUA). This EUA will remain  in effect (meaning this test can be used) for the duration of the COVID-19 declaration under Section 56 4(b)(1) of the Act, 21 U.S.C. section 360bbb-3(b)(1), unless the authorization is terminated or revoked sooner. Performed at [facility], [place] [address], [place], [place] [address]          Radiology Studies: No results found.      Scheduled Meds: . Chlorhexidine Gluconate Cloth  6 each Topical Daily  . enoxaparin (LOVENOX) injection  40 mg Subcutaneous Q24H  . ferrous sulfate  325 mg Oral QODAY  . insulin aspart  0-5 Units Subcutaneous QHS  . insulin aspart  0-6 Units Subcutaneous TID WC  . insulin aspart  2 Units Subcutaneous TID WC  . insulin glargine  10 Units Subcutaneous Daily  . metoCLOPramide (REGLAN) injection  10 mg Intravenous Q6H  . mirtazapine  15 mg Oral QHS  . pantoprazole (PROTONIX) IV  40 mg Intravenous Q12H  . senna  1 tablet Oral BID  . sodium chloride flush  10-40 mL Intracatheter Q12H  . sodium chloride flush  3 mL Intravenous Q12H   Continuous Infusions: . lactated ringers 100 mL/hr at 03/25/19 [id number]     LOS: 9 days     Cordelia Poche, MD [facility] 03/25/2019, 9:01 AM  If 7PM-7AM, please contact night-coverage [email or web address]

## 2019-03-25 NOTE — Progress Notes (Signed)
Pt ate 100% of lunch tray (grilled cheese, potato wedges) and beverage around 1400. Called for nausea meds at 1700. RN went and administered Reglan. Pt then sat up and completely filled emesis basis with 700ccs of undigested yellow/orange vomit. Phenergan administered shortly after. 100% of dinner tray consumed at 1830 (grilled cheese and potato soup) and beverage. Oncoming RN notified. Will continue to monitor.

## 2019-03-26 ENCOUNTER — Inpatient Hospital Stay (HOSPITAL_COMMUNITY): Payer: BC Managed Care – PPO

## 2019-03-26 LAB — GLUCOSE, CAPILLARY
Glucose-Capillary: 342 mg/dL — ABNORMAL HIGH (ref 70–99)
Glucose-Capillary: 206 mg/dL — ABNORMAL HIGH (ref 70–99)
Glucose-Capillary: 169 mg/dL — ABNORMAL HIGH (ref 70–99)
Glucose-Capillary: 114 mg/dL — ABNORMAL HIGH (ref 70–99)

## 2019-03-26 MED ORDER — FLEET ENEMA 7-19 GM/118ML RE ENEM
1.0000 | ENEMA | Freq: Once | RECTAL | Status: DC
  Filled 2019-03-26: qty 1

## 2019-03-26 MED ORDER — POLYETHYLENE GLYCOL 3350 17 G PO PACK
17.0000 g | PACK | Freq: Every day | ORAL | Status: DC
  Administered 2019-03-26 – 2019-04-06 (×7): 17 g via ORAL
  Filled 2019-03-26 (×9): qty 1

## 2019-03-26 NOTE — Progress Notes (Signed)
Pt didn't order any meals today. Had two episodes of 140ml of green bilious emesis.

## 2019-03-26 NOTE — Progress Notes (Signed)
PROGRESS NOTE    [name]  [id number] DOB: 1990/06/02 DOA: 03/16/2019 PCP: Vicenta Aly, FNP   Brief Narrative: [name] is a 29 y.o. medical history with T1DM on insulin pump, GERD, asthma, diabetic gastroparesis/intractable cyclical vomiting, hypertension, history of previous DKA, pancreatitis. Patient presented secondary to nausea/vomiting with concern for gastroparesis flare. Currently on antiemetics, IV fluids and IV analgesics.   Assessment & Plan:   Active Problems:   Diabetic gastroparesis (HCC)   Intractable nausea and vomiting   Diabetes mellitus (HCC)   Intractable nausea and vomiting Secondary to gastroparesis. Several admissions/ED visits for similar symptoms. Patient is supposed to be following up with [facility] but is never well enough to get there. I discussed that when she is improved, she should promptly attempt follow-up with her doctors at [facility]. Continues to have recurrent emesis -Continue Reglan IV scheduled and Phenergan IV prn -Continue soft diet  Abdominal pain Secondary to above in addition to concern for opiate induced pain. Discussed with GI and plan to decrease narcotic dosing. Patient also has not had a bowel movement but is passing gas. No abdominal distention -Abdominal x-ray -Wean narcotics -Kpad  Diabetes mellitus, type 1 Uncontrolled with hyperglycemia and hypoglycemia. Insulin pump off. Recurrent hypoglycemia overnight. Blood sugar significantly elevated this morning secondary to D50 administration. -Continue Lantus 10 units daily and continue very sensitive SSI -Novolog 2 unit TID WC  Essential hypertension Patient is on lisinopril as an outpatient. Held secondary to poor oral tolerance  Asthma Stable. No wheezing  Neurogenic bladder Patient follows with urology. Patient self catheterizes. -Continue CIC q8 hours  Iron deficiency anemia Chronic. Stable. Patient is on iron supplementation as an outpatient. -Continue  iron supplementation as able  Constipation -Suppository, MiraLAX, Senakot-S   DVT prophylaxis: SCDs Code Status:   Code Status: Full Code Family Communication: None at bedside Disposition Plan: Discharge pending improvement of nausea/vomiting and ability to tolerate oral nutrition   Consultants:   Gastroenterology  Procedures:   None  Antimicrobials:  None   Subjective: Some emesis last night. Abdominal pain. Asking for increased pain meds  Objective: Vitals:   03/25/19 0517 03/25/19 1349 03/25/19 2123 03/26/19 0631  BP: (!) 126/92 105/73 (!) 137/99 139/83  Pulse: 80 91 (!) 102 (!) 110  Resp: 19 20 20    Temp: 98.4 F (36.9 C) 98.6 F (37 C) 98.6 F (37 C) 99.8 F (37.7 C)  TempSrc: Oral Oral Oral Oral  SpO2: 96% 95% 95% 97%  Weight:      Height:        Intake/Output Summary (Last 24 hours) at 03/26/2019 1220 Last data filed at 03/25/2019 2100 Gross per 24 hour  Intake 240 ml  Output 600 ml  Net -360 ml   Filed Weights   03/17/19 0537  Weight: 51.6 kg    Examination:  General exam: Appears uncomfortable. Rocking back and forth Respiratory system: Clear to auscultation. Respiratory effort normal. Cardiovascular system: S1 & S2 heard, RRR. No murmurs, rubs, gallops or clicks. Gastrointestinal system: Abdomen is nondistended, soft and nontender. Normal bowel sounds heard. Central nervous system: Alert and oriented. No focal neurological deficits. Extremities: No edema. No calf tenderness Skin: No cyanosis. No rashes Psychiatry: Judgement and insight appear normal. Mood & affect appropriate.     Data Reviewed: I have personally reviewed following labs and imaging studies  CBC: Recent Labs  Lab 03/25/19 0844  WBC 5.9  HGB 9.0*  HCT 29.4*  MCV 87.0  PLT [id number]   Basic Metabolic Panel:  Recent Labs  Lab 03/21/19 0611 03/22/19 0542 03/23/19 0556 03/25/19 0844  NA 137 136 138 134*  K 3.8 3.3* 3.4* 3.5  CL 104 100 101 100  CO2 24 28 29 29     GLUCOSE 255* 155* 124* 263*  BUN 13 8 7  <5*  CREATININE 0.72 0.58 0.58 0.59  CALCIUM 8.5* 8.3* 8.3* 8.1*  MG 1.6*  --   --   --   PHOS 3.3  --   --   --    GFR: Estimated Creatinine Clearance: 84.5 mL/min (by C-G formula based on SCr of 0.59 mg/dL). Liver Function Tests: No results for input(s): AST, ALT, ALKPHOS, BILITOT, PROT, ALBUMIN in the last 168 hours. No results for input(s): LIPASE, AMYLASE in the last 168 hours. No results for input(s): AMMONIA in the last 168 hours. Coagulation Profile: No results for input(s): INR, PROTIME in the last 168 hours. Cardiac Enzymes: No results for input(s): CKTOTAL, CKMB, CKMBINDEX, TROPONINI in the last 168 hours. BNP (last 3 results) No results for input(s): PROBNP in the last 8760 hours. HbA1C: No results for input(s): HGBA1C in the last 72 hours. CBG: Recent Labs  Lab 03/25/19 1643 03/25/19 1943 03/25/19 2015 03/26/19 0734 03/26/19 1149  GLUCAP 378* 63* 120* 342* 206*   Lipid Profile: No results for input(s): CHOL, HDL, LDLCALC, TRIG, CHOLHDL, LDLDIRECT in the last 72 hours. Thyroid Function Tests: No results for input(s): TSH, T4TOTAL, FREET4, T3FREE, THYROIDAB in the last 72 hours. Anemia Panel: No results for input(s): VITAMINB12, FOLATE, FERRITIN, TIBC, IRON, RETICCTPCT in the last 72 hours. Sepsis Labs: No results for input(s): PROCALCITON, LATICACIDVEN in the last 168 hours.  Recent Results (from the past 240 hour(s))  Culture, blood (routine x 2)     Status: Abnormal   Collection Time: 03/16/19 12:30 PM   Specimen: BLOOD  Result Value Ref Range Status   Specimen Description   Final    BLOOD RIGHT CHEST PORT Performed at [facility] [address], [place], [place] [address]    Special Requests   Final    BOTTLES DRAWN AEROBIC AND ANAEROBIC Blood Culture results may not be optimal due to an inadequate volume of blood received in culture bottles Performed at [facility] [address], [place], [place] [address]    Culture  Setup Time   Final    GRAM POSITIVE COCCI IN CLUSTERS ANAEROBIC BOTTLE ONLY CRITICAL RESULT CALLED TO, READ BACK BY AND VERIFIED WITH: PHARMD T. GREEN [id number] FCP    Culture (A)  Final    STAPHYLOCOCCUS SPECIES (COAGULASE NEGATIVE) THE SIGNIFICANCE OF ISOLATING THIS ORGANISM FROM A SINGLE SET OF BLOOD CULTURES WHEN MULTIPLE SETS ARE DRAWN IS UNCERTAIN. PLEASE NOTIFY THE MICROBIOLOGY DEPARTMENT WITHIN ONE WEEK IF SPECIATION AND SENSITIVITIES ARE REQUIRED. Performed at [facility], [place] [address] [place], [place] [address]    Report Status 03/19/2019 FINAL  Final  SARS CORONAVIRUS 2 (TAT 6-24 HRS) Nasopharyngeal Nasopharyngeal Swab     Status: None   Collection Time: 03/16/19  4:25 PM   Specimen: Nasopharyngeal Swab  Result Value Ref Range Status   SARS Coronavirus 2 NEGATIVE NEGATIVE Final    Comment: (NOTE) SARS-CoV-2 target nucleic acids are NOT DETECTED. The SARS-CoV-2 RNA is generally detectable in upper and lower respiratory specimens during the acute phase of infection. Negative results do not preclude SARS-CoV-2 infection, do not rule out co-infections with other pathogens, and should not be used as the sole basis for  treatment or other patient management decisions. Negative results must be combined with clinical observations, patient history, and epidemiological information. The expected result is Negative. Fact Sheet for Patients: SugarRoll.be Fact Sheet for Healthcare Providers: https://www.woods-mathews.com/ This test is not yet approved or cleared by the [place] FDA and  has been authorized for detection and/or diagnosis of SARS-CoV-2 by FDA under an Emergency Use Authorization (EUA). This EUA will remain  in effect (meaning this test can be used) for the duration of the COVID-19 declaration under Section 56 4(b)(1) of the Act, 21 U.S.C. section  360bbb-3(b)(1), unless the authorization is terminated or revoked sooner. Performed at [facility], [place] [address] [place], [place] [address]          Radiology Studies: DG Abd Portable 1V  Result Date: 03/26/2019 CLINICAL DATA:  Abdominal pain and nausea. Last bowel movement was 1 week ago. EXAM: PORTABLE ABDOMEN - 1 VIEW COMPARISON:  02/16/2019 FINDINGS: Moderate colonic stool burden without evidence of enteric obstruction. Nondiagnostic evaluation for pneumoperitoneum secondary to supine positioning and exclusion of the lower thorax. No pneumatosis or portal venous gas. Post cholecystectomy. Punctate phleboliths overlies the left lower pelvis, similar to the 01/2019 examination. Otherwise, no definitive abnormal intra-abdominal calcifications. No acute osseous abnormalities. IMPRESSION: Moderate colonic stool burden without evidence of enteric obstruction. Electronically Signed   By: Sandi Mariscal M.D.   On: 03/26/2019 11:06        Scheduled Meds: . Chlorhexidine Gluconate Cloth  6 each Topical Daily  . enoxaparin (LOVENOX) injection  40 mg Subcutaneous Q24H  . ferrous sulfate  325 mg Oral QODAY  . insulin aspart  0-5 Units Subcutaneous QHS  . insulin aspart  0-6 Units Subcutaneous TID WC  . insulin aspart  2 Units Subcutaneous TID WC  . insulin glargine  10 Units Subcutaneous Daily  . metoCLOPramide (REGLAN) injection  10 mg Intravenous Q6H  . mirtazapine  15 mg Oral QHS  . pantoprazole (PROTONIX) IV  40 mg Intravenous Q12H  . senna  1 tablet Oral BID  . sodium chloride flush  10-40 mL Intracatheter Q12H  . sodium chloride flush  3 mL Intravenous Q12H  . sodium phosphate  1 enema Rectal Once   Continuous Infusions: . lactated ringers 100 mL/hr at 03/26/19 0257     LOS: 10 days     Cordelia Poche, MD [facility] 03/26/2019, 12:20 PM  If 7PM-7AM, please contact night-coverage [email or web address]

## 2019-03-26 NOTE — Progress Notes (Signed)
Patient was told and given basin at the beginning of the shift  report emesis. At 2230 pt called for nausea med and reported she vomited at the bathroom. Patient was reinforced to use basin for emesis collection.

## 2019-03-27 LAB — GLUCOSE, CAPILLARY
Glucose-Capillary: 110 mg/dL — ABNORMAL HIGH (ref 70–99)
Glucose-Capillary: 102 mg/dL — ABNORMAL HIGH (ref 70–99)

## 2019-03-27 MED ORDER — HYDROCODONE-ACETAMINOPHEN 5-325 MG PO TABS
1.0000 | ORAL_TABLET | Freq: Three times a day (TID) | ORAL | Status: DC | PRN
  Administered 2019-03-27 – 2019-04-01 (×12): 2 via ORAL
  Filled 2019-03-27 (×14): qty 2

## 2019-03-27 MED ORDER — RIVAROXABAN 10 MG PO TABS
10.0000 mg | ORAL_TABLET | Freq: Every day | ORAL | Status: DC
  Administered 2019-03-27 – 2019-04-17 (×21): 10 mg via ORAL
  Filled 2019-03-27 (×24): qty 1

## 2019-03-27 NOTE — Progress Notes (Signed)
Unfortunately, no real progress.  Still having periodic emesis, less so over the past 24 hours (nurses notes appreciated).  Still on opiate narcotics.  On exam, the patient is in no distress, but sort of curled up in bed and somewhat emotionally withdrawn.  No succussion splash, bowel sounds are quiet.  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  Recommendations: As before, would try to taper opiates  Cleotis Nipper, M.D. Pager [phone] If no answer or after 5 PM call [phone]

## 2019-03-27 NOTE — Progress Notes (Signed)
PROGRESS NOTE    [name]  [id number] DOB: [birth year] DOA: 03/16/2019 PCP: Vicenta Aly, FNP   Brief Narrative: [name] is a 29 y.o. medical history with T1DM on insulin pump, GERD, asthma, diabetic gastroparesis/intractable cyclical vomiting, hypertension, history of previous DKA, pancreatitis. Patient presented secondary to nausea/vomiting with concern for gastroparesis flare. Currently on antiemetics, IV fluids and IV analgesics.   Assessment & Plan:   Active Problems:   Diabetic gastroparesis (HCC)   Intractable nausea and vomiting   Diabetes mellitus (HCC)   Intractable nausea and vomiting Secondary to gastroparesis. Several admissions/ED visits for similar symptoms. Patient is supposed to be following up with [facility] but is never well enough to get there. I discussed that when she is improved, she should promptly attempt follow-up with her doctors at [facility]. Continues to have recurrent emesis. Emesis yesterday but none this morning. -Continue Reglan IV scheduled and Phenergan IV prn -Continue soft diet  Abdominal pain Secondary to above in addition to concern for opiate induced pain. Discussed with GI and plan to decrease narcotic dosing. Patient also has not had a bowel movement but is passing gas. No abdominal distention. Pain improved today thankfully -Wean narcotics -Kpad  Diabetes mellitus, type 1 Uncontrolled with hyperglycemia and hypoglycemia. Insulin pump off. Recurrent hypoglycemia overnight. Blood sugar significantly elevated this morning secondary to D50 administration. -Continue Lantus 10 units daily and continue very sensitive SSI -Novolog 2 unit TID WC  Essential hypertension Patient is on lisinopril as an outpatient. Held secondary to poor oral tolerance  Asthma Stable. No wheezing  Neurogenic bladder Patient follows with urology. Patient self catheterizes. -Continue CIC q8 hours  Iron deficiency anemia Chronic. Stable. Patient  is on iron supplementation as an outpatient. -Continue iron supplementation as able  Constipation -Suppository, MiraLAX, Senakot-S   DVT prophylaxis: SCDs Code Status:   Code Status: Full Code Family Communication: None at bedside Disposition Plan: Discharge pending improvement of nausea/vomiting and ability to tolerate oral nutrition   Consultants:   Gastroenterology  Procedures:   None  Antimicrobials:  None   Subjective: Patient reports that she feels better today. No emesis this morning. She is not hungry this morning.  Objective: Vitals:   03/26/19 1359 03/26/19 2035 03/27/19 0609 03/27/19 1252  BP: (!) 132/91 115/67 (!) 133/97 110/76  Pulse: (!) 109 87 90 85  Resp: 14 20 17 14   Temp: 98.8 F (37.1 C) 98.7 F (37.1 C) 98.2 F (36.8 C) (!) 97.4 F (36.3 C)  TempSrc: Oral Oral Oral Oral  SpO2: 96% 97% 99% 96%  Weight:      Height:        Intake/Output Summary (Last 24 hours) at 03/27/2019 1330 Last data filed at 03/27/2019 1012 Gross per 24 hour  Intake 7066.84 ml  Output 100 ml  Net 6966.84 ml   Filed Weights   03/17/19 0537  Weight: 51.6 kg    Examination:  General exam: Appears calm and comfortable  Respiratory system: Clear to auscultation. Respiratory effort normal. Cardiovascular system: S1 & S2 heard, RRR. No murmurs, rubs, gallops or clicks. Gastrointestinal system: Abdomen is nondistended, soft and nontender. No organomegaly or masses felt. Normal bowel sounds heard. Central nervous system: Alert and oriented. No focal neurological deficits. Extremities: No edema. No calf tenderness Skin: No cyanosis. No rashes. Port site appears normal Psychiatry: Judgement and insight appear normal.      Data Reviewed: I have personally reviewed following labs and imaging studies  CBC: Recent Labs  Lab 03/25/19 0844  WBC 5.9  HGB 9.0*  HCT 29.4*  MCV 87.0  PLT [id number]   Basic Metabolic Panel: Recent Labs  Lab 03/21/19 0611 03/22/19 0542  03/23/19 0556 03/25/19 0844  NA 137 136 138 134*  K 3.8 3.3* 3.4* 3.5  CL 104 100 101 100  CO2 24 28 29 29   GLUCOSE 255* 155* 124* 263*  BUN 13 8 7  <5*  CREATININE 0.72 0.58 0.58 0.59  CALCIUM 8.5* 8.3* 8.3* 8.1*  MG 1.6*  --   --   --   PHOS 3.3  --   --   --    GFR: Estimated Creatinine Clearance: 84.5 mL/min (by C-G formula based on SCr of 0.59 mg/dL). Liver Function Tests: No results for input(s): AST, ALT, ALKPHOS, BILITOT, PROT, ALBUMIN in the last 168 hours. No results for input(s): LIPASE, AMYLASE in the last 168 hours. No results for input(s): AMMONIA in the last 168 hours. Coagulation Profile: No results for input(s): INR, PROTIME in the last 168 hours. Cardiac Enzymes: No results for input(s): CKTOTAL, CKMB, CKMBINDEX, TROPONINI in the last 168 hours. BNP (last 3 results) No results for input(s): PROBNP in the last 8760 hours. HbA1C: No results for input(s): HGBA1C in the last 72 hours. CBG: Recent Labs  Lab 03/26/19 1149 03/26/19 1653 03/26/19 2134 03/27/19 0732 03/27/19 1135  GLUCAP 206* 169* 114* 110* 102*   Lipid Profile: No results for input(s): CHOL, HDL, LDLCALC, TRIG, CHOLHDL, LDLDIRECT in the last 72 hours. Thyroid Function Tests: No results for input(s): TSH, T4TOTAL, FREET4, T3FREE, THYROIDAB in the last 72 hours. Anemia Panel: No results for input(s): VITAMINB12, FOLATE, FERRITIN, TIBC, IRON, RETICCTPCT in the last 72 hours. Sepsis Labs: No results for input(s): PROCALCITON, LATICACIDVEN in the last 168 hours.  No results found for this or any previous visit (from the past 240 hour(s)).       Radiology Studies: DG Abd Portable 1V  Result Date: 03/26/2019 CLINICAL DATA:  Abdominal pain and nausea. Last bowel movement was 1 week ago. EXAM: PORTABLE ABDOMEN - 1 VIEW COMPARISON:  02/16/2019 FINDINGS: Moderate colonic stool burden without evidence of enteric obstruction. Nondiagnostic evaluation for pneumoperitoneum secondary to supine  positioning and exclusion of the lower thorax. No pneumatosis or portal venous gas. Post cholecystectomy. Punctate phleboliths overlies the left lower pelvis, similar to the 01/2019 examination. Otherwise, no definitive abnormal intra-abdominal calcifications. No acute osseous abnormalities. IMPRESSION: Moderate colonic stool burden without evidence of enteric obstruction. Electronically Signed   By: Sandi Mariscal M.D.   On: 03/26/2019 11:06        Scheduled Meds: . Chlorhexidine Gluconate Cloth  6 each Topical Daily  . ferrous sulfate  325 mg Oral QODAY  . insulin aspart  0-5 Units Subcutaneous QHS  . insulin aspart  0-6 Units Subcutaneous TID WC  . insulin aspart  2 Units Subcutaneous TID WC  . insulin glargine  10 Units Subcutaneous Daily  . metoCLOPramide (REGLAN) injection  10 mg Intravenous Q6H  . mirtazapine  15 mg Oral QHS  . pantoprazole (PROTONIX) IV  40 mg Intravenous Q12H  . polyethylene glycol  17 g Oral Daily  . rivaroxaban  10 mg Oral Daily  . senna  1 tablet Oral BID  . sodium chloride flush  10-40 mL Intracatheter Q12H  . sodium chloride flush  3 mL Intravenous Q12H  . sodium phosphate  1 enema Rectal Once   Continuous Infusions: . lactated ringers 100 mL/hr at 03/27/19 0950     LOS: 11 days  Cordelia Poche, MD [facility] 03/27/2019, 1:30 PM  If 7PM-7AM, please contact night-coverage [email or web address]

## 2019-03-27 NOTE — Progress Notes (Signed)
Pt did not eat any of her meals today and had 386ml of green bilious emesis.

## 2019-03-28 LAB — CBC
HCT: 30.9 % — ABNORMAL LOW (ref 36.0–46.0)
Hemoglobin: 9.5 g/dL — ABNORMAL LOW (ref 12.0–15.0)
MCH: 26.7 pg (ref 26.0–34.0)
MCHC: 30.7 g/dL (ref 30.0–36.0)
MCV: 86.8 fL (ref 80.0–100.0)
Platelets: 335 10*3/uL (ref 150–400)
RBC: 3.56 MIL/uL — ABNORMAL LOW (ref 3.87–5.11)
RDW: 16.2 % — ABNORMAL HIGH (ref 11.5–15.5)
WBC: 5.4 10*3/uL (ref 4.0–10.5)
nRBC: 0 % (ref 0.0–0.2)

## 2019-03-28 LAB — GLUCOSE, CAPILLARY
Glucose-Capillary: 180 mg/dL — ABNORMAL HIGH (ref 70–99)
Glucose-Capillary: 150 mg/dL — ABNORMAL HIGH (ref 70–99)
Glucose-Capillary: 129 mg/dL — ABNORMAL HIGH (ref 70–99)
Glucose-Capillary: 109 mg/dL — ABNORMAL HIGH (ref 70–99)

## 2019-03-28 LAB — BASIC METABOLIC PANEL
Anion gap: 9 (ref 5–15)
BUN: 11 mg/dL (ref 6–20)
CO2: 25 mmol/L (ref 22–32)
Calcium: 8.4 mg/dL — ABNORMAL LOW (ref 8.9–10.3)
Chloride: 102 mmol/L (ref 98–111)
Creatinine, Ser: 0.66 mg/dL (ref 0.44–1.00)
GFR calc Af Amer: >60 mL/min (ref >60)
GFR calc non Af Amer: >60 mL/min (ref >60)
Glucose, Bld: 174 mg/dL — ABNORMAL HIGH (ref 70–99)
Potassium: 3.6 mmol/L (ref 3.5–5.1)
Sodium: 136 mmol/L (ref 135–145)

## 2019-03-28 LAB — MAGNESIUM: Magnesium: 1.4 mg/dL — ABNORMAL LOW (ref 1.7–2.4)

## 2019-03-28 MED ORDER — HYDROMORPHONE HCL 1 MG/ML IJ SOLN
0.5000 mg | Freq: Two times a day (BID) | INTRAMUSCULAR | Status: DC | PRN
  Administered 2019-03-29 – 2019-04-01 (×7): 0.5 mg via INTRAVENOUS
  Filled 2019-03-28 (×7): qty 0.5

## 2019-03-28 MED ORDER — MAGNESIUM SULFATE 2 GM/50ML IV SOLN
2.0000 g | Freq: Once | INTRAVENOUS | Status: AC
  Administered 2019-03-28: 2 g via INTRAVENOUS
  Filled 2019-03-28: qty 50

## 2019-03-28 NOTE — TOC Progression Note (Signed)
Transition of Care [facility]) - Progression Note    Patient Details  Name: [name] MRN: [id number] Date of Birth: [birth year]  Transition of Care [facility]) CM/SW Contact  Evellyn Tuff, Marjie Skiff, RN Phone Number: 03/28/2019, 11:44 AM  Clinical Narrative:    TOC continuing to follow for DC needs.   Expected Discharge Plan: Home/Self Care Barriers to Discharge: Continued Medical Work up  Expected Discharge Plan and Services Expected Discharge Plan: Home/Self Care       Living arrangements for the past 2 months: Single Family Home Expected Discharge Date: (unknown)                                     Social Determinants of Health (SDOH) Interventions    Readmission Risk Interventions Readmission Risk Prevention Plan 03/24/2019 03/03/2019 02/23/2019  Transportation Screening Complete Complete Complete  Medication Review (RN Care Manager) Complete Complete Complete  PCP or Specialist appointment within 3-5 days of discharge Complete Complete Not Complete  PCP/Specialist Appt Not Complete comments - Patient next appointment is scheduled for 2/16. got earliest apointment avaliable which is 2/16-which is 9 business days from d/c date  [place] or [place] Complete Complete Complete  HRI or Home Care Consult Pt Refusal Comments - - -  SW Recovery Care/Counseling Consult Complete Complete Complete  SW Consult Not Complete Comments - - -  Palliative Care Screening Not Applicable Not Applicable Not Applicable  Comments - - -  [place] Not Applicable Not Applicable Not Applicable  Some recent data might be hidden

## 2019-03-28 NOTE — Progress Notes (Signed)
50 mLs of green bilious emesis at 0820

## 2019-03-28 NOTE — Progress Notes (Signed)
PROGRESS NOTE    [name]  [id number] DOB: July 01, 1990 DOA: 03/16/2019 PCP: Vicenta Aly, FNP   Brief Narrative: [name] is a 29 y.o. medical history with T1DM on insulin pump, GERD, asthma, diabetic gastroparesis/intractable cyclical vomiting, hypertension, history of previous DKA, pancreatitis. Patient presented secondary to nausea/vomiting with concern for gastroparesis flare. Currently on antiemetics, IV fluids and IV analgesics.   Assessment & Plan:   Active Problems:   Diabetic gastroparesis (HCC)   Intractable nausea and vomiting   Diabetes mellitus (HCC)   Intractable nausea and vomiting Secondary to gastroparesis. Several admissions/ED visits for similar symptoms. Patient is supposed to be following up with [facility] but is never well enough to get there. I discussed that when she is improved, she should promptly attempt follow-up with her doctors at [facility]. Patient's symptoms appear to wax and wane but she has not tolerated any food for the past two days. -Continue Reglan IV scheduled and Phenergan IV prn -Continue soft diet -GI recommendations: antiemetics. Wean narcotics  Abdominal pain Secondary to above in addition to concern for opiate induced pain. Discussed with GI and plan to decrease narcotic dosing. Patient also has not had a bowel movement but is passing gas. No abdominal distention. Pain improved today thankfully -Weaning narcotics per GI recommendations -Kpad  Diabetes mellitus, type 1 Uncontrolled with hyperglycemia and hypoglycemia. Insulin pump off. Recurrent hypoglycemia overnight. -Continue Lantus 10 units daily and continue very sensitive SSI -Novolog 2 unit TID WC  Essential hypertension Patient is on lisinopril as an outpatient. Held secondary to poor oral tolerance  Asthma Stable. No wheezing  Neurogenic bladder Patient follows with urology. Patient self catheterizes. -Continue CIC q8 hours  Iron deficiency  anemia Chronic. Stable. Patient is on iron supplementation as an outpatient. -Continue iron supplementation as able  Constipation -Suppository, MiraLAX, Senakot-S   DVT prophylaxis: SCDs Code Status:   Code Status: Full Code Family Communication: None at bedside Disposition Plan: Discharge pending improvement of nausea/vomiting and ability to tolerate oral nutrition   Consultants:   Gastroenterology  Procedures:   None  Antimicrobials:  None   Subjective: Pain is returned.   Objective: Vitals:   03/27/19 1252 03/27/19 2021 03/28/19 0509 03/28/19 1256  BP: 110/76 (!) 137/99 (!) 120/93 123/88  Pulse: 85 (!) 107 (!) 103 (!) 104  Resp: 14 18 18 20   Temp: (!) 97.4 F (36.3 C) 99.2 F (37.3 C) 99.2 F (37.3 C) 99 F (37.2 C)  TempSrc: Oral Oral Oral Oral  SpO2: 96% 100% 99% 98%  Weight:      Height:        Intake/Output Summary (Last 24 hours) at 03/28/2019 1557 Last data filed at 03/28/2019 0500 Gross per 24 hour  Intake 1358.52 ml  Output 200 ml  Net 1158.52 ml   Filed Weights   03/17/19 0537  Weight: 51.6 kg    Examination:  General exam: Appears uncomfortable and in pain Respiratory system: Clear to auscultation. Respiratory effort normal. Cardiovascular system: S1 & S2 heard, RRR. No murmurs, rubs, gallops or clicks. Gastrointestinal system: Abdomen is nondistended, soft and mildly tender with no guarding. Normal bowel sounds heard. Central nervous system: Alert and oriented. No focal neurological deficits. Extremities: No edema. No calf tenderness Skin: No cyanosis. No rashes Psychiatry: Judgement and insight appear normal. Mood & affect appropriate.      Data Reviewed: I have personally reviewed following labs and imaging studies  CBC: Recent Labs  Lab 03/25/19 0844 03/28/19 0600  WBC 5.9 5.4  HGB 9.0* 9.5*  HCT 29.4* 30.9*  MCV 87.0 86.8  PLT 361 [id number]   Basic Metabolic Panel: Recent Labs  Lab 03/22/19 0542 03/23/19 0556  03/25/19 0844 03/28/19 0600  NA 136 138 134* 136  K 3.3* 3.4* 3.5 3.6  CL 100 101 100 102  CO2 28 29 29 25   GLUCOSE 155* 124* 263* 174*  BUN 8 7 <5* 11  CREATININE 0.58 0.58 0.59 0.66  CALCIUM 8.3* 8.3* 8.1* 8.4*  MG  --   --   --  1.4*   GFR: Estimated Creatinine Clearance: 84.5 mL/min (by C-G formula based on SCr of 0.66 mg/dL). Liver Function Tests: No results for input(s): AST, ALT, ALKPHOS, BILITOT, PROT, ALBUMIN in the last 168 hours. No results for input(s): LIPASE, AMYLASE in the last 168 hours. No results for input(s): AMMONIA in the last 168 hours. Coagulation Profile: No results for input(s): INR, PROTIME in the last 168 hours. Cardiac Enzymes: No results for input(s): CKTOTAL, CKMB, CKMBINDEX, TROPONINI in the last 168 hours. BNP (last 3 results) No results for input(s): PROBNP in the last 8760 hours. HbA1C: No results for input(s): HGBA1C in the last 72 hours. CBG: Recent Labs  Lab 03/27/19 0732 03/27/19 1135 03/28/19 0735 03/28/19 1208 03/28/19 1550  GLUCAP 110* 102* 180* 150* 129*   Lipid Profile: No results for input(s): CHOL, HDL, LDLCALC, TRIG, CHOLHDL, LDLDIRECT in the last 72 hours. Thyroid Function Tests: No results for input(s): TSH, T4TOTAL, FREET4, T3FREE, THYROIDAB in the last 72 hours. Anemia Panel: No results for input(s): VITAMINB12, FOLATE, FERRITIN, TIBC, IRON, RETICCTPCT in the last 72 hours. Sepsis Labs: No results for input(s): PROCALCITON, LATICACIDVEN in the last 168 hours.  No results found for this or any previous visit (from the past 240 hour(s)).       Radiology Studies: No results found.      Scheduled Meds: . Chlorhexidine Gluconate Cloth  6 each Topical Daily  . ferrous sulfate  325 mg Oral QODAY  . insulin aspart  0-5 Units Subcutaneous QHS  . insulin aspart  0-6 Units Subcutaneous TID WC  . insulin aspart  2 Units Subcutaneous TID WC  . insulin glargine  10 Units Subcutaneous Daily  . metoCLOPramide (REGLAN)  injection  10 mg Intravenous Q6H  . mirtazapine  15 mg Oral QHS  . pantoprazole (PROTONIX) IV  40 mg Intravenous Q12H  . polyethylene glycol  17 g Oral Daily  . rivaroxaban  10 mg Oral Daily  . senna  1 tablet Oral BID  . sodium chloride flush  10-40 mL Intracatheter Q12H  . sodium phosphate  1 enema Rectal Once   Continuous Infusions: . lactated ringers 100 mL/hr at 03/28/19 0710     LOS: 12 days     Cordelia Poche, MD [facility] 03/28/2019, 3:57 PM  If 7PM-7AM, please contact night-coverage [email or web address]

## 2019-03-29 LAB — URINALYSIS, ROUTINE W REFLEX MICROSCOPIC
Bilirubin Urine: NEGATIVE
Glucose, UA: >=500 mg/dL — AB
Ketones, ur: 80 mg/dL — AB
Nitrite: NEGATIVE
Protein, ur: 30 mg/dL — AB
RBC / HPF: >50 RBC/hpf — ABNORMAL HIGH (ref 0–5)
Specific Gravity, Urine: 1.018 (ref 1.005–1.030)
pH: 6 (ref 5.0–8.0)

## 2019-03-29 LAB — GLUCOSE, CAPILLARY
Glucose-Capillary: 100 mg/dL — ABNORMAL HIGH (ref 70–99)
Glucose-Capillary: 104 mg/dL — ABNORMAL HIGH (ref 70–99)
Glucose-Capillary: 90 mg/dL (ref 70–99)
Glucose-Capillary: 216 mg/dL — ABNORMAL HIGH (ref 70–99)
Glucose-Capillary: 45 mg/dL — ABNORMAL LOW (ref 70–99)
Glucose-Capillary: 270 mg/dL — ABNORMAL HIGH (ref 70–99)
Glucose-Capillary: 302 mg/dL — ABNORMAL HIGH (ref 70–99)
Glucose-Capillary: 348 mg/dL — ABNORMAL HIGH (ref 70–99)

## 2019-03-29 LAB — CBC
HCT: 30.1 % — ABNORMAL LOW (ref 36.0–46.0)
Hemoglobin: 9.3 g/dL — ABNORMAL LOW (ref 12.0–15.0)
MCH: 26.7 pg (ref 26.0–34.0)
MCHC: 30.9 g/dL (ref 30.0–36.0)
MCV: 86.5 fL (ref 80.0–100.0)
Platelets: 308 10*3/uL (ref 150–400)
RBC: 3.48 MIL/uL — ABNORMAL LOW (ref 3.87–5.11)
RDW: 16.3 % — ABNORMAL HIGH (ref 11.5–15.5)
WBC: 5.3 10*3/uL (ref 4.0–10.5)
nRBC: 0 % (ref 0.0–0.2)

## 2019-03-29 LAB — BASIC METABOLIC PANEL
Anion gap: 6 (ref 5–15)
BUN: 11 mg/dL (ref 6–20)
CO2: 27 mmol/L (ref 22–32)
Calcium: 8.1 mg/dL — ABNORMAL LOW (ref 8.9–10.3)
Chloride: 103 mmol/L (ref 98–111)
Creatinine, Ser: 0.63 mg/dL (ref 0.44–1.00)
GFR calc Af Amer: >60 mL/min (ref >60)
GFR calc non Af Amer: >60 mL/min (ref >60)
Glucose, Bld: 56 mg/dL — ABNORMAL LOW (ref 70–99)
Potassium: 3.3 mmol/L — ABNORMAL LOW (ref 3.5–5.1)
Sodium: 136 mmol/L (ref 135–145)

## 2019-03-29 LAB — MAGNESIUM: Magnesium: 1.9 mg/dL (ref 1.7–2.4)

## 2019-03-29 MED ORDER — VANCOMYCIN HCL IN DEXTROSE 1-5 GM/200ML-% IV SOLN
1000.0000 mg | Freq: Once | INTRAVENOUS | Status: AC
  Administered 2019-03-29: 1000 mg via INTRAVENOUS
  Filled 2019-03-29: qty 200

## 2019-03-29 MED ORDER — VANCOMYCIN HCL 750 MG/150ML IV SOLN
750.0000 mg | Freq: Two times a day (BID) | INTRAVENOUS | Status: AC
  Administered 2019-03-30 – 2019-04-03 (×10): 750 mg via INTRAVENOUS
  Filled 2019-03-29 (×10): qty 150

## 2019-03-29 MED ORDER — INSULIN GLARGINE 100 UNIT/ML ~~LOC~~ SOLN
8.0000 [IU] | Freq: Every day | SUBCUTANEOUS | Status: DC
  Administered 2019-03-29 – 2019-03-30 (×2): 8 [IU] via SUBCUTANEOUS
  Filled 2019-03-29 (×2): qty 0.08

## 2019-03-29 MED ORDER — SODIUM CHLORIDE 0.9 % IV SOLN: INTRAVENOUS | Status: DC

## 2019-03-29 MED ORDER — POTASSIUM CHLORIDE CRYS ER 20 MEQ PO TBCR
40.0000 meq | EXTENDED_RELEASE_TABLET | Freq: Once | ORAL | Status: AC
  Administered 2019-03-29: 40 meq via ORAL
  Filled 2019-03-29: qty 2

## 2019-03-29 NOTE — Progress Notes (Signed)
PROGRESS NOTE    [name]  [id number] DOB: 06/23/1990 DOA: 03/16/2019 PCP: Vicenta Aly, FNP   Brief Narrative: 29 year old with past medical significant for type 1 diabetes on insulin pump, GERD, asthma, diabetes gastroparesis, intractable cyclical vomiting, hypertension, history of previous DKA and pancreatitis who presents secondary to nausea vomiting with concern of gastroparesis flare.  Patient currently receiving antiemetics, IV fluids and IV analgesic.  Plan was to proceed with opioid weaning to prevent worsening of diabetes gastroparesis.    Assessment & Plan:   Active Problems:   Diabetic gastroparesis (HCC)   Intractable nausea and vomiting   Diabetes mellitus (HCC)  1-Intractable nausea and vomiting: Not to be related to gastroparesis.  Prior history of cholecystectomy. Several admissions and ED visits for similar symptoms. Patient need to follow-up at at discharge. Continue with IV Reglan, and as needed Phenergan. Continue to vomit and have abdominal pain. Weaning narcotics. Plan for endoscopy on Friday.  2-abdominal pain: Secondary to above, #1.  Also concern for opioid induced pain.  Plan to taper off of narcotics.  3-diabetes type 1: With hypoglycemia Insulin pump off. Decrease Lantus to 8 units.  Continue with meal coverage  Hypokalemia: Replete orally.  Anemia, iron deficiency: Continue with iron supplements  Constipation: Continue with MiraLAX Senokot and suppository KUB with moderate colonic stool burden on 3/7  Neurogenic bladder: She performed self catheterization. Bladder scan.   Blood culture positive for staphylococcus Coagulase negative. From port catheter.  Will repeat blood culture.  Consult ID/.   Hypokalemia; replete orally.   Estimated body mass index is 19.53 kg/m as calculated from the following:   Height as of this encounter: 5\' 4"  (1.626 m).   Weight as of this encounter: 51.6 kg.   DVT prophylaxis:  Code  Status:  Family Communication: Disposition Plan:  Patient is from: Home  Anticipated d/c date: Home when tolerates diet.  Barriers to d/c or necessity for inpatient status: still with persistent nausea and vomiting. Not tolerating oral intake  Consultants:   GI  Procedures:   none  Antimicrobials:    Subjective: Alert, complaining of abdominal pain , vomited earlier this am  Objective: Vitals:   03/28/19 0509 03/28/19 1256 03/28/19 2049 03/29/19 1203  BP: (!) 120/93 123/88 (!) 140/95 (!) 122/91  Pulse: (!) 103 (!) 104 100 90  Resp: 18 20 17 20   Temp: 99.2 F (37.3 C) 99 F (37.2 C) 98.8 F (37.1 C) 98.8 F (37.1 C)  TempSrc: Oral Oral Oral Oral  SpO2: 99% 98% 98% 100%  Weight:      Height:        Intake/Output Summary (Last 24 hours) at 03/29/2019 1544 Last data filed at 03/29/2019 0500 Gross per 24 hour  Intake 2220.64 ml  Output -  Net 2220.64 ml   Filed Weights   03/17/19 0537  Weight: 51.6 kg    Examination:  General exam: Appears calm and comfortable  Respiratory system: Clear to auscultation. Respiratory effort normal. Cardiovascular system: S1 & S2 heard, RRR. No JVD, murmurs, rubs, gallops or clicks. No pedal edema. Gastrointestinal system: Abdomen is nondistended, soft and nontender. No organomegaly or masses felt. Normal bowel sounds heard. Central nervous system: Alert and oriented. No focal neurological deficits. Extremities: Symmetric 5 x 5 power. Skin: No rashes, lesions or ulcers Psychiatry: Judgement and insight appear normal. Mood & affect appropriate.     Data Reviewed: I have personally reviewed following labs and imaging studies  CBC: Recent Labs  Lab 03/25/19 0844 03/28/19  0600 03/29/19 0525  WBC 5.9 5.4 5.3  HGB 9.0* 9.5* 9.3*  HCT 29.4* 30.9* 30.1*  MCV 87.0 86.8 86.5  PLT 361 335 [id number]   Basic Metabolic Panel: Recent Labs  Lab 03/23/19 0556 03/25/19 0844 03/28/19 0600 03/29/19 0525  NA 138 134* 136 136  K 3.4*  3.5 3.6 3.3*  CL 101 100 102 103  CO2 29 29 25 27   GLUCOSE 124* 263* 174* 56*  BUN 7 <5* 11 11  CREATININE 0.58 0.59 0.66 0.63  CALCIUM 8.3* 8.1* 8.4* 8.1*  MG  --   --  1.4* 1.9   GFR: Estimated Creatinine Clearance: 84.5 mL/min (by C-G formula based on SCr of 0.63 mg/dL). Liver Function Tests: No results for input(s): AST, ALT, ALKPHOS, BILITOT, PROT, ALBUMIN in the last 168 hours. No results for input(s): LIPASE, AMYLASE in the last 168 hours. No results for input(s): AMMONIA in the last 168 hours. Coagulation Profile: No results for input(s): INR, PROTIME in the last 168 hours. Cardiac Enzymes: No results for input(s): CKTOTAL, CKMB, CKMBINDEX, TROPONINI in the last 168 hours. BNP (last 3 results) No results for input(s): PROBNP in the last 8760 hours. HbA1C: No results for input(s): HGBA1C in the last 72 hours. CBG: Recent Labs  Lab 03/28/19 1709 03/28/19 2051 03/29/19 0741 03/29/19 0818 03/29/19 1205  GLUCAP 109* 90 45* 216* 270*   Lipid Profile: No results for input(s): CHOL, HDL, LDLCALC, TRIG, CHOLHDL, LDLDIRECT in the last 72 hours. Thyroid Function Tests: No results for input(s): TSH, T4TOTAL, FREET4, T3FREE, THYROIDAB in the last 72 hours. Anemia Panel: No results for input(s): VITAMINB12, FOLATE, FERRITIN, TIBC, IRON, RETICCTPCT in the last 72 hours. Sepsis Labs: No results for input(s): PROCALCITON, LATICACIDVEN in the last 168 hours.  No results found for this or any previous visit (from the past 240 hour(s)).       Radiology Studies: No results found.      Scheduled Meds: . Chlorhexidine Gluconate Cloth  6 each Topical Daily  . ferrous sulfate  325 mg Oral QODAY  . insulin aspart  0-5 Units Subcutaneous QHS  . insulin aspart  0-6 Units Subcutaneous TID WC  . insulin aspart  2 Units Subcutaneous TID WC  . insulin glargine  8 Units Subcutaneous Daily  . metoCLOPramide (REGLAN) injection  10 mg Intravenous Q6H  . mirtazapine  15 mg Oral QHS   . pantoprazole (PROTONIX) IV  40 mg Intravenous Q12H  . polyethylene glycol  17 g Oral Daily  . rivaroxaban  10 mg Oral Daily  . senna  1 tablet Oral BID  . sodium chloride flush  10-40 mL Intracatheter Q12H  . sodium phosphate  1 enema Rectal Once   Continuous Infusions: . lactated ringers 100 mL/hr at 03/29/19 0500     LOS: 13 days    Time spent: 35 minutes     A , MD [facility]   If 7PM-7AM, please contact night-coverage [email or web address]  03/29/2019, 3:44 PM

## 2019-03-29 NOTE — Progress Notes (Signed)
Pharmacy Antibiotic Note  [name] is a 29 y.o. female admitted on 03/16/2019 with potential CNS bacteremia.  Pharmacy has been consulted for vanc dosing. Note that vanc is only to continue for 3 to 5 days per Md  Plan: Vancomycin 1g IV x 1 then 750mg  IV q12 - goal AUC 400-550  Height: 5\' 4"  (162.6 cm) Weight: 113 lb 12.1 oz (51.6 kg) IBW/kg (Calculated) : 54.7  Temp (24hrs), Avg:98.8 F (37.1 C), Min:98.8 F (37.1 C), Max:98.8 F (37.1 C)  Recent Labs  Lab 03/23/19 0556 03/25/19 0844 03/28/19 0600 03/29/19 0525  WBC  --  5.9 5.4 5.3  CREATININE 0.58 0.59 0.66 0.63    Estimated Creatinine Clearance: 84.5 mL/min (by C-G formula based on SCr of 0.63 mg/dL).    Allergies  Allergen Reactions  . Peanut-Containing Drug Products Swelling and Other (See Comments)    Reaction:  Swelling of mouth and lips   . 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   Reaction:  Swelling of mouth and lips   . Ultram [Tramadol] Itching      Thank you for allowing pharmacy to be a part of this patient's care.  Kara Mead 03/29/2019 4:42 PM

## 2019-03-29 NOTE — Progress Notes (Signed)
Significant reduction in opiate administration over the past 24 hours, consistent with plan for weaning.  Given the patient's very poor oral intake, I wonder if a core track tube placement with intermittent small bolus tube feedings or very slow constant infusion would be appropriate at this point.  In the meantime, however, I see where the patient has not had endoscopic evaluation for 5 years.  Before placing a feeding tube, I think it would be prudent to confirm that there is no anatomic lesion accounting for the patient's nausea and vomiting (doubt), so I have arranged for the patient to have endoscopy on Friday (first available time on the endoscopy schedule).  I reviewed the nature, purpose and risks of endoscopy with the patient and she is agreeable to this plan of action.  Cleotis Nipper, M.D. Pager [phone] If no answer or after 5 PM call [phone]

## 2019-03-29 NOTE — Progress Notes (Signed)
CRITICAL VALUE ALERT  Critical Value:  K 3.3  Date & Time Notied:  03/29/19 [id number]  Provider Notified: Dr Tyrell Antonio  Orders Received/Actions taken:

## 2019-03-29 NOTE — Progress Notes (Signed)
Addendum to previous note:  Have stopped pt's ferrous sulfate.  I doubt it's causing her n/v, but it might be contributing to it to some degree.  Cleotis Nipper, M.D. Pager [phone] If no answer or after 5 PM call [phone]

## 2019-03-29 NOTE — Progress Notes (Signed)
Hypoglycemic Event  CBG: 45  Treatment:PRN ampule of D50 given  Symptoms: N/A patient alert and oriented  Follow-up CBG: [id number] CBG Result: 216  Possible Reasons for Event: Poor oral intake; patient not wanted to eat.  Comments/MD notified:Regalado, MD    Lucy Chris

## 2019-03-30 LAB — BASIC METABOLIC PANEL
Anion gap: 6 (ref 5–15)
BUN: 8 mg/dL (ref 6–20)
CO2: 25 mmol/L (ref 22–32)
Calcium: 8.2 mg/dL — ABNORMAL LOW (ref 8.9–10.3)
Chloride: 103 mmol/L (ref 98–111)
Creatinine, Ser: 0.63 mg/dL (ref 0.44–1.00)
GFR calc Af Amer: >60 mL/min (ref >60)
GFR calc non Af Amer: >60 mL/min (ref >60)
Glucose, Bld: 335 mg/dL — ABNORMAL HIGH (ref 70–99)
Potassium: 4.1 mmol/L (ref 3.5–5.1)
Sodium: 134 mmol/L — ABNORMAL LOW (ref 135–145)

## 2019-03-30 LAB — GLUCOSE, CAPILLARY
Glucose-Capillary: 294 mg/dL — ABNORMAL HIGH (ref 70–99)
Glucose-Capillary: 244 mg/dL — ABNORMAL HIGH (ref 70–99)
Glucose-Capillary: 190 mg/dL — ABNORMAL HIGH (ref 70–99)
Glucose-Capillary: 280 mg/dL — ABNORMAL HIGH (ref 70–99)

## 2019-03-30 MED ORDER — BISACODYL 10 MG RE SUPP
10.0000 mg | Freq: Once | RECTAL | Status: AC
  Administered 2019-03-30: 10 mg via RECTAL
  Filled 2019-03-30: qty 1

## 2019-03-30 MED ORDER — INSULIN GLARGINE 100 UNIT/ML ~~LOC~~ SOLN
10.0000 [IU] | Freq: Every day | SUBCUTANEOUS | Status: DC
  Administered 2019-03-31 – 2019-04-01 (×2): 10 [IU] via SUBCUTANEOUS
  Filled 2019-03-30 (×2): qty 0.1

## 2019-03-30 NOTE — Progress Notes (Signed)
Patient reports no change in the way her stomach feels.  Blood culture, drawn through her port, shows coagulase-negative staph, being treated with transient IV Vanco as probable contaminant. No fever, no leukocytosis.  Potassium normal this morning.  Plan: endoscopic evaluation tomorrow.  If negative, consider core trak feeding tube for nutrition support.  Cleotis Nipper, M.D. Pager [phone] If no answer or after 5 PM call [phone]

## 2019-03-30 NOTE — Progress Notes (Signed)
PROGRESS NOTE    [name]  [id number] DOB: 1990-11-15 DOA: 03/16/2019 PCP: Vicenta Aly, FNP   Brief Narrative: 29 year old with past medical significant for type 1 diabetes on insulin pump, GERD, asthma, diabetes gastroparesis, intractable cyclical vomiting, hypertension, history of previous DKA and pancreatitis who presents secondary to nausea vomiting with concern of gastroparesis flare.  Patient currently receiving antiemetics, IV fluids and IV analgesic.  Plan was to proceed with opioid weaning to prevent worsening of diabetes gastroparesis.    Assessment & Plan:   Active Problems:   Diabetic gastroparesis (HCC)   Intractable nausea and vomiting   Diabetes mellitus (HCC)  1-Intractable nausea and vomiting: Not to be related to gastroparesis.  Prior history of cholecystectomy. Several admissions and ED visits for similar symptoms. Patient need to follow-up at at discharge. Continue with IV Reglan, and as needed Phenergan. Continue to vomit and have abdominal pain. Weaning narcotics. Plan for endoscopy tomorrow.   2-Abdominal pain: Secondary to above, #1.  Also concern for opioid induced pain.  Plan to taper off of narcotics.  3-Diabetes type 1: With hypoglycemia Insulin pump off. Continue with meal coverage Hyperglycemia, increase lantus to 10 units.   Hypokalemia: Replaced.   Anemia, iron deficiency: Continue with iron supplements  Constipation: Continue with MiraLAX Senokot and suppository KUB with moderate colonic stool burden on 3/7 No BM, try suppository.   Neurogenic bladder: She performed self catheterization. Bladder scan. Patient report she has been urinating.   Pyuria;  Check urine culture.   Blood culture positive for staphylococcus Coagulase negative. From port catheter.  Repeated blood culture; No growth to date.  Discussed with ID, plan to treat with IV vancomycin for 3-- 5 five days.    Estimated body mass index is 18.16 kg/m  as calculated from the following:   Height as of this encounter: 5\' 4"  (1.626 m).   Weight as of this encounter: 48 kg.   DVT prophylaxis:  Code Status:  Family Communication: Disposition Plan:  Patient is from: Home  Anticipated d/c date: Home when tolerates diet.  Barriers to d/c or necessity for inpatient status: still with persistent nausea and vomiting. Not tolerating oral intake  Consultants:   GI  Procedures:   none  Antimicrobials:    Subjective: Rocking in the bed, complaints of abdominal pain. Asking for pain medications.  Objective: Vitals:   03/29/19 2201 03/30/19 0607 03/30/19 0700 03/30/19 1216  BP: 97/81 (!) 147/99  103/63  Pulse: 83 91  (!) 107  Resp: 16 17  20   Temp: 98.2 F (36.8 C) 98.6 F (37 C)  98.7 F (37.1 C)  TempSrc: Oral Oral  Oral  SpO2: 97% 97%  100%  Weight:   48 kg   Height:        Intake/Output Summary (Last 24 hours) at 03/30/2019 1601 Last data filed at 03/30/2019 1518 Gross per 24 hour  Intake 4002.07 ml  Output 750 ml  Net 3252.07 ml   Filed Weights   03/17/19 0537 03/30/19 0700  Weight: 51.6 kg 48 kg    Examination:  General exam: NAD Respiratory system: CTA Cardiovascular system: S 1, S 2 RRR Gastrointestinal system: BS present, soft, nt Central nervous system: non focal.  Extremities: no edema    Data Reviewed: I have personally reviewed following labs and imaging studies  CBC: Recent Labs  Lab 03/25/19 0844 03/28/19 0600 03/29/19 0525  WBC 5.9 5.4 5.3  HGB 9.0* 9.5* 9.3*  HCT 29.4* 30.9* 30.1*  MCV 87.0 86.8  86.5  PLT 361 335 [id number]   Basic Metabolic Panel: Recent Labs  Lab 03/25/19 0844 03/28/19 0600 03/29/19 0525 03/30/19 0437  NA 134* 136 136 134*  K 3.5 3.6 3.3* 4.1  CL 100 102 103 103  CO2 29 25 27 25   GLUCOSE 263* 174* 56* 335*  BUN <5* 11 11 8   CREATININE 0.59 0.66 0.63 0.63  CALCIUM 8.1* 8.4* 8.1* 8.2*  MG  --  1.4* 1.9  --    GFR: Estimated Creatinine Clearance: 78.6 mL/min (by  C-G formula based on SCr of 0.63 mg/dL). Liver Function Tests: No results for input(s): AST, ALT, ALKPHOS, BILITOT, PROT, ALBUMIN in the last 168 hours. No results for input(s): LIPASE, AMYLASE in the last 168 hours. No results for input(s): AMMONIA in the last 168 hours. Coagulation Profile: No results for input(s): INR, PROTIME in the last 168 hours. Cardiac Enzymes: No results for input(s): CKTOTAL, CKMB, CKMBINDEX, TROPONINI in the last 168 hours. BNP (last 3 results) No results for input(s): PROBNP in the last 8760 hours. HbA1C: No results for input(s): HGBA1C in the last 72 hours. CBG: Recent Labs  Lab 03/29/19 1205 03/29/19 1649 03/29/19 2201 03/30/19 0751 03/30/19 1145  GLUCAP 270* 302* 348* 294* 244*   Lipid Profile: No results for input(s): CHOL, HDL, LDLCALC, TRIG, CHOLHDL, LDLDIRECT in the last 72 hours. Thyroid Function Tests: No results for input(s): TSH, T4TOTAL, FREET4, T3FREE, THYROIDAB in the last 72 hours. Anemia Panel: No results for input(s): VITAMINB12, FOLATE, FERRITIN, TIBC, IRON, RETICCTPCT in the last 72 hours. Sepsis Labs: No results for input(s): PROCALCITON, LATICACIDVEN in the last 168 hours.  Recent Results (from the past 240 hour(s))  Culture, blood (routine x 2)     Status: None (Preliminary result)   Collection Time: 03/29/19  4:18 PM   Specimen: BLOOD  Result Value Ref Range Status   Specimen Description   Final    BLOOD LEFT ARM Performed at [facility], [place] [address] [place], [place] [address]    Special Requests   Final    BOTTLES DRAWN AEROBIC ONLY Blood Culture results may not be optimal due to an inadequate volume of blood received in culture bottles Performed at [facility] [address], [place], [place] [address]    Culture   Final    NO GROWTH < 24 HOURS Performed at [facility] [address], [place], [place] [address]    Report Status PENDING  Incomplete  Culture,  blood (routine x 2)     Status: None (Preliminary result)   Collection Time: 03/29/19  4:18 PM   Specimen: BLOOD LEFT HAND  Result Value Ref Range Status   Specimen Description   Final    BLOOD LEFT HAND Performed at [facility] [address] [place], [place] [address]    Special Requests   Final    BOTTLES DRAWN AEROBIC ONLY Blood Culture adequate volume Performed at [facility] [address] [place], [place] [address]    Culture   Final    NO GROWTH < 24 HOURS Performed at [facility] [address], [place], [place] [address]    Report Status PENDING  Incomplete         Radiology Studies: No results found.      Scheduled Meds: . Chlorhexidine Gluconate Cloth  6 each Topical Daily  . insulin aspart  0-5 Units Subcutaneous QHS  . insulin aspart  0-6  Units Subcutaneous TID WC  . insulin aspart  2 Units Subcutaneous TID WC  . [START ON 03/31/2019] insulin glargine  10 Units Subcutaneous Daily  . metoCLOPramide (REGLAN) injection  10 mg Intravenous Q6H  . mirtazapine  15 mg Oral QHS  . pantoprazole (PROTONIX) IV  40 mg Intravenous Q12H  . polyethylene glycol  17 g Oral Daily  . rivaroxaban  10 mg Oral Daily  . senna  1 tablet Oral BID  . sodium chloride flush  10-40 mL Intracatheter Q12H  . sodium phosphate  1 enema Rectal Once   Continuous Infusions: . sodium chloride 100 mL/hr at 03/30/19 1518  . vancomycin Stopped (03/30/19 0721)     LOS: 14 days    Time spent: 35 minutes    Romona Murdy A Shakthi Scipio, MD [facility]   If 7PM-7AM, please contact night-coverage [email or web address]  03/30/2019, 4:01 PM

## 2019-03-31 ENCOUNTER — Encounter (HOSPITAL_COMMUNITY): Payer: Self-pay | Admitting: Internal Medicine

## 2019-03-31 ENCOUNTER — Inpatient Hospital Stay (HOSPITAL_COMMUNITY): Payer: BC Managed Care – PPO | Admitting: Certified Registered"

## 2019-03-31 ENCOUNTER — Other Ambulatory Visit: Payer: Self-pay

## 2019-03-31 ENCOUNTER — Encounter (HOSPITAL_COMMUNITY)
Admission: EM | Disposition: A | Discharge status: Home / Self Care | Payer: Self-pay | Source: Home / Self Care | Attending: Internal Medicine

## 2019-03-31 HISTORY — PX: ESOPHAGOGASTRODUODENOSCOPY (EGD) WITH PROPOFOL: SHX5813

## 2019-03-31 LAB — COMPREHENSIVE METABOLIC PANEL
ALT: 7 U/L (ref 0–44)
AST: 9 U/L — ABNORMAL LOW (ref 15–41)
Albumin: 2.9 g/dL — ABNORMAL LOW (ref 3.5–5.0)
Alkaline Phosphatase: 58 U/L (ref 38–126)
Anion gap: 8 (ref 5–15)
BUN: 8 mg/dL (ref 6–20)
CO2: 23 mmol/L (ref 22–32)
Calcium: 7.9 mg/dL — ABNORMAL LOW (ref 8.9–10.3)
Chloride: 102 mmol/L (ref 98–111)
Creatinine, Ser: 0.59 mg/dL (ref 0.44–1.00)
GFR calc Af Amer: >60 mL/min (ref >60)
GFR calc non Af Amer: >60 mL/min (ref >60)
Glucose, Bld: 336 mg/dL — ABNORMAL HIGH (ref 70–99)
Potassium: 3.9 mmol/L (ref 3.5–5.1)
Sodium: 133 mmol/L — ABNORMAL LOW (ref 135–145)
Total Bilirubin: 0.9 mg/dL (ref 0.3–1.2)
Total Protein: 5.3 g/dL — ABNORMAL LOW (ref 6.5–8.1)

## 2019-03-31 LAB — GLUCOSE, CAPILLARY
Glucose-Capillary: 310 mg/dL — ABNORMAL HIGH (ref 70–99)
Glucose-Capillary: 208 mg/dL — ABNORMAL HIGH (ref 70–99)
Glucose-Capillary: 202 mg/dL — ABNORMAL HIGH (ref 70–99)
Glucose-Capillary: 148 mg/dL — ABNORMAL HIGH (ref 70–99)
Glucose-Capillary: 279 mg/dL — ABNORMAL HIGH (ref 70–99)

## 2019-03-31 LAB — CREATININE, SERUM
Creatinine, Ser: 0.63 mg/dL (ref 0.44–1.00)
GFR calc Af Amer: >60 mL/min (ref >60)
GFR calc non Af Amer: >60 mL/min (ref >60)

## 2019-03-31 LAB — CBC
HCT: 28.7 % — ABNORMAL LOW (ref 36.0–46.0)
Hemoglobin: 8.8 g/dL — ABNORMAL LOW (ref 12.0–15.0)
MCH: 27.1 pg (ref 26.0–34.0)
MCHC: 30.7 g/dL (ref 30.0–36.0)
MCV: 88.3 fL (ref 80.0–100.0)
Platelets: 291 10*3/uL (ref 150–400)
RBC: 3.25 MIL/uL — ABNORMAL LOW (ref 3.87–5.11)
RDW: 16 % — ABNORMAL HIGH (ref 11.5–15.5)
WBC: 7.7 10*3/uL (ref 4.0–10.5)
nRBC: 0 % (ref 0.0–0.2)

## 2019-03-31 LAB — URINE CULTURE: Culture: NO GROWTH

## 2019-03-31 SURGERY — ESOPHAGOGASTRODUODENOSCOPY (EGD) WITH PROPOFOL
Anesthesia: Monitor Anesthesia Care

## 2019-03-31 MED ORDER — PROPOFOL 500 MG/50ML IV EMUL
INTRAVENOUS | Status: DC | PRN
  Administered 2019-03-31: 135 ug/kg/min via INTRAVENOUS

## 2019-03-31 MED ORDER — PROPOFOL 10 MG/ML IV BOLUS
INTRAVENOUS | Status: DC | PRN
  Administered 2019-03-31 (×2): 20 mg via INTRAVENOUS
  Administered 2019-03-31: 10 mg via INTRAVENOUS

## 2019-03-31 MED ORDER — MAGNESIUM HYDROXIDE 400 MG/5ML PO SUSP
30.0000 mL | Freq: Once | ORAL | Status: AC
  Administered 2019-03-31: 30 mL via ORAL
  Filled 2019-03-31: qty 30

## 2019-03-31 MED ORDER — PROPOFOL 500 MG/50ML IV EMUL
INTRAVENOUS | Status: AC
  Filled 2019-03-31: qty 100

## 2019-03-31 MED ORDER — LACTATED RINGERS IV SOLN
INTRAVENOUS | Status: AC | PRN
  Administered 2019-03-31: 1000 mL via INTRAVENOUS

## 2019-03-31 SURGICAL SUPPLY — 14 items

## 2019-03-31 NOTE — Progress Notes (Signed)
Inpatient Diabetes Program Recommendations  AACE/ADA: New Consensus Statement on Inpatient Glycemic Control (2015)  Target Ranges:  Prepandial:   less than 140 mg/dL      Peak postprandial:   less than 180 mg/dL (1-2 hours)      Critically ill patients:  140 - 180 mg/dL   Lab Results  Component Value Date   GLUCAP 202 (H) 03/31/2019   HGBA1C 7.8 (H) 02/18/2019    Review of Glycemic Control Results for [name], [name] (MRN [id number]) as of 03/31/2019 13:08  Ref. Range 03/30/2019 07:51 03/30/2019 11:45 03/30/2019 16:32 03/30/2019 21:55 03/31/2019 07:46 03/31/2019 10:31 03/31/2019 11:58  Glucose-Capillary Latest Ref Range: 70 - 99 mg/dL 294 (H) 244 (H) 190 (H) 280 (H) 310 (H) 208 (H) 202 (H)   Diabetes history: DM type 1 Outpatient Diabetes medications: insulin pump Current orders for Inpatient glycemic control:  Lantus 10 units Daily Novolog 0-6 units tid + hs Novolog 2 units tid meal coverage  Inpatient Diabetes Program Recommendations:    Fasting glucose > 300 this am. Consider increasing Lantus to 12 units.  Thanks,  Tama Headings RN, MSN, BC-ADM Inpatient Diabetes Coordinator Team Pager [phone] (8a-5p)

## 2019-03-31 NOTE — Interval H&P Note (Signed)
History and Physical Interval Note:  03/31/2019 9:36 AM  [name]  has presented today for surgery, with the diagnosis of Nausea and vomiting.  The various methods of treatment have been discussed with the patient. After consideration of risks, benefits and other options for treatment, the patient has consented to  Procedure(s): ESOPHAGOGASTRODUODENOSCOPY (EGD) WITH PROPOFOL (N/A) as a surgical intervention.  The patient's history has been reviewed, patient examined, no change in status, stable for surgery.  I have reviewed the patient's chart and labs.  Questions were answered to the patient's satisfaction.     [name]

## 2019-03-31 NOTE — Transfer of Care (Signed)
Immediate Anesthesia Transfer of Care Note  [name]  Procedure(s) Performed: ESOPHAGOGASTRODUODENOSCOPY (EGD) WITH PROPOFOL (N/A )  Patient Location: PACU  Anesthesia Type:MAC  Level of Consciousness: awake, alert  and oriented  Airway & Oxygen Therapy: Patient Spontanous Breathing and Patient connected to face mask oxygen  Post-op Assessment: Report given to RN, Post -op Vital signs reviewed and stable and Patient moving all extremities X 4  Post vital signs: Reviewed and stable  Last Vitals:  Vitals Value Taken Time  BP    Temp    Pulse    Resp    SpO2      Last Pain:  Vitals:   03/31/19 0833  TempSrc: Oral  PainSc: 7       Patients Stated Pain Goal: 2 (16/10/96 0454)  Complications: No apparent anesthesia complications

## 2019-03-31 NOTE — Op Note (Signed)
[facility] Patient Name: [name] Procedure Date: 03/31/2019 MRN: [id number] Attending MD: Ronald Lobo , MD Date of Birth: [birth year] CSN: [id number] Age: 29 Admit Type: Outpatient Procedure:                Upper GI endoscopy Indications:              Nausea with vomiting Providers:                Ronald Lobo, MD, Cleda Daub, RN, Corie Chiquito,                            Technician, Maudry Diego, CRNA Referring MD:              Medicines:                Monitored Anesthesia Care Complications:            No immediate complications. Estimated Blood Loss:     Estimated blood loss: none. Procedure:                Pre-Anesthesia Assessment:                           - Prior to the procedure, a History and Physical                            was performed, and patient medications and                            allergies were reviewed. The patient's tolerance of                            previous anesthesia was also reviewed. The risks                            and benefits of the procedure and the sedation                            options and risks were discussed with the patient.                            All questions were answered, and informed consent                            was obtained. Prior Anticoagulants: The patient has                            taken no previous anticoagulant or antiplatelet                            agents. ASA Grade Assessment: III - A patient with                            severe systemic disease. After reviewing the risks  and benefits, the patient was deemed in                            satisfactory condition to undergo the procedure.                           After obtaining informed consent, the endoscope was                            passed under direct vision. Throughout the                            procedure, the patient's blood pressure, pulse, and     oxygen saturations were monitored continuously. The                            [id number]) Olympus gastroscope was                            introduced through the mouth, and advanced to the                            second part of duodenum. The upper GI endoscopy was                            accomplished without difficulty. The patient                            tolerated the procedure well. Scope In: Scope Out: Findings:      LA Grade A (one or more mucosal breaks less than 5 mm, not extending       between tops of 2 mucosal folds) esophagitis with no bleeding was found,       consistent with patient's recent vomiting. No evidence of infectious       esophagitis.      A large amount of food (residue) was found on the greater curvature of       the stomach.      The exam of the stomach was otherwise normal. Motility seemed to be       normal, with nice stripping waves toward the pylorus.      The cardia and gastric fundus were normal on retroflexion.      There is no endoscopic evidence of stenosis in the pylorus.      The examined duodenum was normal. Biopsies were taken with a cold       forceps for histology.      Biopsies were taken with a cold forceps in the gastric antrum for       histology. Impression:               - LA Grade A erosive esophagitis with no bleeding.                           - A large amount of food (residue) in the stomach.                           -  Normal examined duodenum. Biopsied.                           - Biopsies were taken with a cold forceps for                            histology in the gastric antrum.                           - The overall picture is consistent with diabetic                            gastroparesis. No anatomic cause for nausea and                            vomiting seen. Moderate Sedation:      This patient was sedated with monitored anesthesia care, not moderate       sedation. Recommendation:           -  Await pathology results to check for possible                            infectious or inflammatory causes of nausea and                            vomiting.                           - Continue present medications.                           - Observe patient's clinical course. Procedure Code(s):        --- Professional ---                           [phone], Esophagogastroduodenoscopy, flexible,                            transoral; with biopsy, single or multiple Diagnosis Code(s):        --- Professional ---                           K20.80, Other esophagitis without bleeding                           R11.2, Nausea with vomiting, unspecified CPT copyright 2019 [employer] Medical Association. All rights reserved. The codes documented in this report are preliminary and upon coder review may  be revised to meet current compliance requirements. Ronald Lobo, MD 03/31/2019 10:23:23 AM This report has been signed electronically. Number of Addenda: 0

## 2019-03-31 NOTE — Progress Notes (Signed)
PROGRESS NOTE    [name]  [id number] DOB: Sep 20, 1990 DOA: 03/16/2019 PCP: Vicenta Aly, FNP   Brief Narrative: 29 year old with past medical significant for type 1 diabetes on insulin pump, GERD, asthma, diabetes gastroparesis, intractable cyclical vomiting, hypertension, history of previous DKA and pancreatitis who presents secondary to nausea vomiting with concern of gastroparesis flare.  Patient currently receiving antiemetics, IV fluids and IV analgesic.  Plan was to proceed with opioid weaning to prevent worsening of diabetes gastroparesis.    Assessment & Plan:   Active Problems:   Diabetic gastroparesis (HCC)   Intractable nausea and vomiting   Diabetes mellitus (HCC)  1-Intractable nausea and vomiting: Not to be related to gastroparesis.  Prior history of cholecystectomy. Several admissions and ED visits for similar symptoms. Patient need to follow-up at at discharge. Continue with IV Reglan, and as needed Phenergan. Continue to vomit and have abdominal pain. Weaning narcotics. Endoscopy showed retain food stomach, no anatomical cause for nausea, vomiting.  Plan to start diet, continue with Reglan.   2-Abdominal pain: Secondary to above, #1.  Also concern for opioid induced pain.  Plan to taper off of narcotics.  3-Diabetes type 1: With hypoglycemia Insulin pump off. Continue with meal coverage Hyperglycemia, increase lantus to 10 units.   Hypokalemia: Replaced.   Anemia, iron deficiency: Continue with iron supplements  Constipation: Continue with MiraLAX Senokot and suppository KUB with moderate colonic stool burden on 3/7 Had BM.   Neurogenic bladder: She performed self catheterization. Bladder scan. Patient report she has been urinating.   Pyuria;  Urine culture no growth to date,.   Blood culture positive for staphylococcus Coagulase negative. From port catheter.  Repeated blood culture; No growth to date.  Discussed with ID, plan to  treat with IV vancomycin for 3-- 5 five days.  Day 2.   Estimated body mass index is 18.32 kg/m as calculated from the following:   Height as of this encounter: 5\' 4"  (1.626 m).   Weight as of this encounter: 48.4 kg.   DVT prophylaxis:  Code Status:  Family Communication: Disposition Plan:  Patient is from: Home  Anticipated d/c date: Home when tolerates diet.  Barriers to d/c or necessity for inpatient status: still with persistent nausea and vomiting. Not tolerating oral intake  Consultants:   GI  Procedures:   none  Antimicrobials:    Subjective: Looks better. Feeling better.  She wants to try to eat  Objective: Vitals:   03/31/19 1031 03/31/19 1040 03/31/19 1316 03/31/19 1527  BP: (!) 131/105 (!) 154/93 (!) 144/92 104/70  Pulse: 86 85 (!) 117 87  Resp: 11 13 20 16   Temp:   98.9 F (37.2 C) 98.8 F (37.1 C)  TempSrc:   Oral Oral  SpO2: 100% 100% 100% 97%  Weight:      Height:        Intake/Output Summary (Last 24 hours) at 03/31/2019 1618 Last data filed at 03/31/2019 1017 Gross per 24 hour  Intake 1600 ml  Output 1250 ml  Net 350 ml   Filed Weights   03/17/19 0537 03/30/19 0700 03/31/19 0603  Weight: 51.6 kg 48 kg 48.4 kg    Examination:  General exam: NAD Respiratory system: CTA Cardiovascular system: S 1, S 2 RRR Gastrointestinal system: BS present, soft, nt Central nervous system: Non focal  Extremities: no edema    Data Reviewed: I have personally reviewed following labs and imaging studies  CBC: Recent Labs  Lab 03/25/19 0844 03/28/19 0600 03/29/19 0525  03/31/19 0650  WBC 5.9 5.4 5.3 7.7  HGB 9.0* 9.5* 9.3* 8.8*  HCT 29.4* 30.9* 30.1* 28.7*  MCV 87.0 86.8 86.5 88.3  PLT 361 335 308 [id number]   Basic Metabolic Panel: Recent Labs  Lab 03/25/19 0844 03/25/19 0844 03/28/19 0600 03/29/19 0525 03/30/19 0437 03/31/19 0540 03/31/19 0650  NA 134*  --  136 136 134*  --  133*  K 3.5  --  3.6 3.3* 4.1  --  3.9  CL 100  --  102 103  103  --  102  CO2 29  --  25 27 25   --  23  GLUCOSE 263*  --  174* 56* 335*  --  336*  BUN <5*  --  11 11 8   --  8  CREATININE 0.59   < > 0.66 0.63 0.63 0.63 0.59  CALCIUM 8.1*  --  8.4* 8.1* 8.2*  --  7.9*  MG  --   --  1.4* 1.9  --   --   --    < > = values in this interval not displayed.   GFR: Estimated Creatinine Clearance: 79.3 mL/min (by C-G formula based on SCr of 0.59 mg/dL). Liver Function Tests: Recent Labs  Lab 03/31/19 0650  AST 9*  ALT 7  ALKPHOS 58  BILITOT 0.9  PROT 5.3*  ALBUMIN 2.9*   No results for input(s): LIPASE, AMYLASE in the last 168 hours. No results for input(s): AMMONIA in the last 168 hours. Coagulation Profile: No results for input(s): INR, PROTIME in the last 168 hours. Cardiac Enzymes: No results for input(s): CKTOTAL, CKMB, CKMBINDEX, TROPONINI in the last 168 hours. BNP (last 3 results) No results for input(s): PROBNP in the last 8760 hours. HbA1C: No results for input(s): HGBA1C in the last 72 hours. CBG: Recent Labs  Lab 03/30/19 1632 03/30/19 2155 03/31/19 0746 03/31/19 1031 03/31/19 1158  GLUCAP 190* 280* 310* 208* 202*   Lipid Profile: No results for input(s): CHOL, HDL, LDLCALC, TRIG, CHOLHDL, LDLDIRECT in the last 72 hours. Thyroid Function Tests: No results for input(s): TSH, T4TOTAL, FREET4, T3FREE, THYROIDAB in the last 72 hours. Anemia Panel: No results for input(s): VITAMINB12, FOLATE, FERRITIN, TIBC, IRON, RETICCTPCT in the last 72 hours. Sepsis Labs: No results for input(s): PROCALCITON, LATICACIDVEN in the last 168 hours.  Recent Results (from the past 240 hour(s))  Culture, blood (routine x 2)     Status: None (Preliminary result)   Collection Time: 03/29/19  4:18 PM   Specimen: BLOOD  Result Value Ref Range Status   Specimen Description   Final    BLOOD LEFT ARM Performed at [facility], [place] [address], [place], [place] [address]    Special Requests   Final    BOTTLES DRAWN AEROBIC ONLY  Blood Culture results may not be optimal due to an inadequate volume of blood received in culture bottles Performed at [facility] [address], [place], [place] [address]    Culture   Final    NO GROWTH 2 DAYS Performed at [facility] [address] [place], [place] [address]    Report Status PENDING  Incomplete  Culture, blood (routine x 2)     Status: None (Preliminary result)   Collection Time: 03/29/19  4:18 PM   Specimen: BLOOD LEFT HAND  Result Value Ref Range Status   Specimen Description   Final    BLOOD LEFT HAND Performed at [facility]  [address] [place], [place] [address]    Special Requests   Final    BOTTLES DRAWN AEROBIC ONLY Blood Culture adequate volume Performed at [facility] [address], [place], [place] [address]    Culture   Final    NO GROWTH 2 DAYS Performed at [facility] [address] [place], [place] [address]    Report Status PENDING  Incomplete  Urine Culture     Status: None   Collection Time: 03/30/19  5:07 PM   Specimen: Urine, Random  Result Value Ref Range Status   Specimen Description   Final    URINE, RANDOM Performed at [facility] [address] [place], [place] [address]    Special Requests   Final    NONE Performed at [facility], [place] [address], [place], [place] [address]    Culture   Final    NO GROWTH Performed at [facility], [place] [address] [place], [place] [address]    Report Status 03/31/2019 FINAL  Final         Radiology Studies: No results found.      Scheduled Meds: . Chlorhexidine Gluconate Cloth  6 each Topical Daily  . insulin aspart  0-5 Units Subcutaneous QHS  . insulin aspart  0-6 Units Subcutaneous TID WC  . insulin aspart  2 Units Subcutaneous TID WC  . insulin glargine  10 Units Subcutaneous Daily  . metoCLOPramide (REGLAN) injection  10 mg Intravenous Q6H    . mirtazapine  15 mg Oral QHS  . pantoprazole (PROTONIX) IV  40 mg Intravenous Q12H  . polyethylene glycol  17 g Oral Daily  . rivaroxaban  10 mg Oral Daily  . senna  1 tablet Oral BID  . sodium chloride flush  10-40 mL Intracatheter Q12H  . sodium phosphate  1 enema Rectal Once   Continuous Infusions: . sodium chloride 100 mL/hr at 03/30/19 1518  . vancomycin 750 mg (03/31/19 0553)     LOS: 15 days    Time spent: 35 minutes    Katrine Radich A Ayiden Milliman, MD [facility]   If 7PM-7AM, please contact night-coverage [email or web address]  03/31/2019, 4:18 PM

## 2019-03-31 NOTE — Plan of Care (Signed)
  Problem: Clinical Measurements: Goal: Ability to maintain clinical measurements within normal limits will improve Outcome: Progressing Goal: Will remain free from infection Outcome: Progressing Goal: Diagnostic test results will improve Outcome: Progressing Goal: Respiratory complications will improve Outcome: Progressing Goal: Cardiovascular complication will be avoided Outcome: Progressing   Problem: Activity: Goal: Risk for activity intolerance will decrease Outcome: Progressing   Problem: Coping: Goal: Level of anxiety will decrease Outcome: Progressing   Problem: Elimination: Goal: Will not experience complications related to urinary retention Outcome: Progressing   Problem: Safety: Goal: Ability to remain free from injury will improve Outcome: Progressing   Problem: Skin Integrity: Goal: Risk for impaired skin integrity will decrease Outcome: Progressing   Problem: Education: Goal: Knowledge of General Education information will improve Description: Including pain rating scale, medication(s)/side effects and non-pharmacologic comfort measures Outcome: Not Progressing   Problem: Health Behavior/Discharge Planning: Goal: Ability to manage health-related needs will improve Outcome: Not Progressing   Problem: Nutrition: Goal: Adequate nutrition will be maintained Outcome: Not Progressing   Problem: Elimination: Goal: Will not experience complications related to bowel motility Outcome: Not Progressing   Problem: Pain Managment: Goal: General experience of comfort will improve Outcome: Not Progressing

## 2019-03-31 NOTE — Anesthesia Postprocedure Evaluation (Signed)
Anesthesia Post Note  [name]  Procedure(s) Performed: ESOPHAGOGASTRODUODENOSCOPY (EGD) WITH PROPOFOL (N/A )     Patient location during evaluation: PACU Anesthesia Type: MAC Level of consciousness: awake and alert and oriented Pain management: pain level controlled Vital Signs Assessment: post-procedure vital signs reviewed and stable Respiratory status: spontaneous breathing, nonlabored ventilation and respiratory function stable Cardiovascular status: blood pressure returned to baseline Postop Assessment: no apparent nausea or vomiting Anesthetic complications: no    Last Vitals:  Vitals:   03/31/19 1019 03/31/19 1020  BP: 126/83 (!) 146/97  Pulse:  83  Resp: 19 15  Temp:  36.6 C  SpO2: 100% 100%    Last Pain:  Vitals:   03/31/19 1020  TempSrc: Oral  PainSc:                  Brennan Bailey

## 2019-03-31 NOTE — Anesthesia Procedure Notes (Signed)
Procedure Name: MAC Date/Time: 03/31/2019 9:54 AM Performed by: Niel Hummer, CRNA Pre-anesthesia Checklist: Patient identified, Emergency Drugs available, Suction available and Patient being monitored Oxygen Delivery Method: Simple face mask

## 2019-03-31 NOTE — Progress Notes (Signed)
Patient's EGD was well-tolerated and showed no anatomic cause for nausea and vomiting, but a lot of retained food in the stomach, consistent with diabetic gastroparesis.  The patient indicates to me that she tolerated dinner last night and that she feels able to continue on a diet.  Accordingly, I have ordered a soft diet for her.  If this is not tolerated, I think the next step would be placement of a nasoenteric feeding tube into the small intestine.  Cleotis Nipper, M.D. Pager [phone] If no answer or after 5 PM call [phone]

## 2019-03-31 NOTE — Anesthesia Preprocedure Evaluation (Addendum)
Anesthesia Evaluation  Patient identified by MRN, date of birth, ID band Patient awake    Reviewed: Allergy & Precautions, NPO status , Patient's Chart, lab work & pertinent test results  History of Anesthesia Complications Negative for: history of anesthetic complications  Airway Mallampati: II  TM Distance: >3 FB Neck ROM: Full    Dental  (+) Chipped,    Pulmonary asthma ,    Pulmonary exam normal        Cardiovascular hypertension, Pt. on medications Normal cardiovascular exam     Neuro/Psych  Headaches, Anxiety Depression    GI/Hepatic Neg liver ROS, GERD  Medicated and Controlled,gastroparesis   Endo/Other  negative endocrine ROSdiabetes, Poorly Controlled, Type 1, Insulin Dependent  Renal/GU negative Renal ROS  negative genitourinary   Musculoskeletal  (+) Arthritis ,   Abdominal   Peds  Hematology negative hematology ROS (+)   Anesthesia Other Findings Day of surgery medications reviewed with patient.  Reproductive/Obstetrics negative OB ROS                            Anesthesia Physical Anesthesia Plan  ASA: III  Anesthesia Plan: MAC   Post-op Pain Management:    Induction:   PONV Risk Score and Plan: 2 and Treatment may vary due to age or medical condition and Propofol infusion  Airway Management Planned: Natural Airway and Nasal Cannula  Additional Equipment: None  Intra-op Plan:   Post-operative Plan:   Informed Consent: I have reviewed the patients History and Physical, chart, labs and discussed the procedure including the risks, benefits and alternatives for the proposed anesthesia with the patient or authorized representative who has indicated his/her understanding and acceptance.       Plan Discussed with: CRNA  Anesthesia Plan Comments:        Anesthesia Quick Evaluation

## 2019-04-01 LAB — GLUCOSE, CAPILLARY
Glucose-Capillary: 243 mg/dL — ABNORMAL HIGH (ref 70–99)
Glucose-Capillary: 154 mg/dL — ABNORMAL HIGH (ref 70–99)
Glucose-Capillary: 73 mg/dL (ref 70–99)
Glucose-Capillary: 284 mg/dL — ABNORMAL HIGH (ref 70–99)

## 2019-04-01 LAB — BASIC METABOLIC PANEL
Anion gap: 6 (ref 5–15)
BUN: 6 mg/dL (ref 6–20)
CO2: 26 mmol/L (ref 22–32)
Calcium: 8.2 mg/dL — ABNORMAL LOW (ref 8.9–10.3)
Chloride: 104 mmol/L (ref 98–111)
Creatinine, Ser: 0.47 mg/dL (ref 0.44–1.00)
GFR calc Af Amer: >60 mL/min (ref >60)
GFR calc non Af Amer: >60 mL/min (ref >60)
Glucose, Bld: 175 mg/dL — ABNORMAL HIGH (ref 70–99)
Potassium: 3.7 mmol/L (ref 3.5–5.1)
Sodium: 136 mmol/L (ref 135–145)

## 2019-04-01 LAB — CREATININE, SERUM
Creatinine, Ser: 0.46 mg/dL (ref 0.44–1.00)
GFR calc Af Amer: >60 mL/min (ref >60)
GFR calc non Af Amer: >60 mL/min (ref >60)

## 2019-04-01 MED ORDER — HYDROCODONE-ACETAMINOPHEN 5-325 MG PO TABS
1.0000 | ORAL_TABLET | Freq: Three times a day (TID) | ORAL | Status: DC | PRN
  Administered 2019-04-01 – 2019-04-06 (×7): 1 via ORAL
  Filled 2019-04-01 (×7): qty 1

## 2019-04-01 MED ORDER — SODIUM CHLORIDE 0.9 % IV SOLN
250.0000 mg | Freq: Three times a day (TID) | INTRAVENOUS | Status: DC
  Administered 2019-04-01 – 2019-04-17 (×48): 250 mg via INTRAVENOUS
  Filled 2019-04-01 (×50): qty 5

## 2019-04-01 MED ORDER — INSULIN GLARGINE 100 UNIT/ML ~~LOC~~ SOLN
12.0000 [IU] | Freq: Every day | SUBCUTANEOUS | Status: DC
  Administered 2019-04-02 – 2019-04-09 (×8): 12 [IU] via SUBCUTANEOUS
  Filled 2019-04-01 (×9): qty 0.12

## 2019-04-01 MED ORDER — HYDROMORPHONE HCL 1 MG/ML IJ SOLN
0.5000 mg | Freq: Every day | INTRAMUSCULAR | Status: DC | PRN
  Administered 2019-04-01: 22:00:00 0.5 mg via INTRAVENOUS
  Filled 2019-04-01: qty 0.5

## 2019-04-01 NOTE — Progress Notes (Addendum)
PROGRESS NOTE    [name]  [id number] DOB: 23-Aug-1990 DOA: 03/16/2019 PCP: Vicenta Aly, FNP   Brief Narrative: 29 year old with past medical significant for type 1 diabetes on insulin pump, GERD, asthma, diabetes gastroparesis, intractable cyclical vomiting, hypertension, history of previous DKA and pancreatitis who presents secondary to nausea vomiting with concern of gastroparesis flare.  Patient currently receiving antiemetics, IV fluids and IV analgesic.  Plan was to proceed with opioid weaning to prevent worsening of diabetes gastroparesis.    Assessment & Plan:   Active Problems:   Diabetic gastroparesis (HCC)   Intractable nausea and vomiting   Diabetes mellitus (HCC)  1-Intractable nausea and vomiting: Not to be related to gastroparesis.  Prior history of cholecystectomy. Several admissions and ED visits for similar symptoms. Continue with IV Reglan, and as needed Phenergan. Weaning narcotics. Will change dilaudid to daily today plan to stop IV soon.  Endoscopy showed retain food stomach, no anatomical cause for nausea, vomiting.  Plan to start diet, continue with Reglan.  Started on Erythromycin 3-13.  2-Abdominal pain: Secondary to above, #1.  Also concern for opioid induced pain.  Plan to taper off of narcotics.  3-Diabetes type 1: With hypoglycemia Insulin pump off. Continue with meal coverage Hyperglycemia, increase lantus to 12 units.   Hypokalemia: Replaced.   Anemia, iron deficiency: Continue with iron supplements  Constipation: Continue with MiraLAX Senokot and suppository KUB with moderate colonic stool burden on 3/7 Had BM.   Neurogenic bladder: She performed self catheterization. Bladder scan. Patient report she has been urinating.   Pyuria;  Urine culture no growth to date,.   Blood culture positive for staphylococcus Coagulase negative. From port catheter.  Repeated blood culture; No growth to date.  Discussed with ID, plan  to treat with IV vancomycin for 3-- 5 five days.  Day 3.   Estimated body mass index is 17.97 kg/m as calculated from the following:   Height as of this encounter: 5\' 4"  (1.626 m).   Weight as of this encounter: 47.5 kg.   DVT prophylaxis: On xarelto , patient refuse Lovenox. Not on anticoagulation prior to admission Code Status: Full code Family Communication: Care discussed with Mother 3-12 Disposition Plan:  Patient is from: Home  Anticipated d/c date: Home when tolerates diet.  Barriers to d/c or necessity for inpatient status: still with persistent nausea and vomiting. Not tolerating oral intake  Consultants:   GI  Procedures:   none  Antimicrobials:    Subjective: She vomited this am.  Discussed with patient plan to continue weaning narcotics.   Objective: Vitals:   03/31/19 2325 04/01/19 0628 04/01/19 0843 04/01/19 1332  BP: 111/81  108/88 (!) 127/97  Pulse: 86  (!) 102 93  Resp: 20  20 14   Temp: 98.6 F (37 C)  98.7 F (37.1 C) 98.8 F (37.1 C)  TempSrc: Oral  Oral Oral  SpO2: 98%  96% 99%  Weight:  47.5 kg    Height:        Intake/Output Summary (Last 24 hours) at 04/01/2019 1556 Last data filed at 04/01/2019 1300 Gross per 24 hour  Intake 2025.24 ml  Output 20 ml  Net 2005.24 ml   Filed Weights   03/30/19 0700 03/31/19 0603 04/01/19 0628  Weight: 48 kg 48.4 kg 47.5 kg    Examination:  General exam: NAD Respiratory system: CTA Cardiovascular system: S 1, S 2 RRR Gastrointestinal system: BS present, soft, nt Central nervous system: Non focal.  Extremities: No edema  Data Reviewed: I have personally reviewed following labs and imaging studies  CBC: Recent Labs  Lab 03/28/19 0600 03/29/19 0525 03/31/19 0650  WBC 5.4 5.3 7.7  HGB 9.5* 9.3* 8.8*  HCT 30.9* 30.1* 28.7*  MCV 86.8 86.5 88.3  PLT 335 308 [id number]   Basic Metabolic Panel: Recent Labs  Lab 03/28/19 0600 03/28/19 0600 03/29/19 0525 03/29/19 0525 03/30/19 0437  03/31/19 0540 03/31/19 0650 04/01/19 0547 04/01/19 1143  NA 136  --  136  --  134*  --  133*  --  136  K 3.6  --  3.3*  --  4.1  --  3.9  --  3.7  CL 102  --  103  --  103  --  102  --  104  CO2 25  --  27  --  25  --  23  --  26  GLUCOSE 174*  --  56*  --  335*  --  336*  --  175*  BUN 11  --  11  --  8  --  8  --  6  CREATININE 0.66   < > 0.63   < > 0.63 0.63 0.59 0.46 0.47  CALCIUM 8.4*  --  8.1*  --  8.2*  --  7.9*  --  8.2*  MG 1.4*  --  1.9  --   --   --   --   --   --    < > = values in this interval not displayed.   GFR: Estimated Creatinine Clearance: 77.8 mL/min (by C-G formula based on SCr of 0.47 mg/dL). Liver Function Tests: Recent Labs  Lab 03/31/19 0650  AST 9*  ALT 7  ALKPHOS 58  BILITOT 0.9  PROT 5.3*  ALBUMIN 2.9*   No results for input(s): LIPASE, AMYLASE in the last 168 hours. No results for input(s): AMMONIA in the last 168 hours. Coagulation Profile: No results for input(s): INR, PROTIME in the last 168 hours. Cardiac Enzymes: No results for input(s): CKTOTAL, CKMB, CKMBINDEX, TROPONINI in the last 168 hours. BNP (last 3 results) No results for input(s): PROBNP in the last 8760 hours. HbA1C: No results for input(s): HGBA1C in the last 72 hours. CBG: Recent Labs  Lab 03/31/19 1158 03/31/19 1706 03/31/19 2129 04/01/19 0731 04/01/19 1150  GLUCAP 202* 148* 279* 243* 154*   Lipid Profile: No results for input(s): CHOL, HDL, LDLCALC, TRIG, CHOLHDL, LDLDIRECT in the last 72 hours. Thyroid Function Tests: No results for input(s): TSH, T4TOTAL, FREET4, T3FREE, THYROIDAB in the last 72 hours. Anemia Panel: No results for input(s): VITAMINB12, FOLATE, FERRITIN, TIBC, IRON, RETICCTPCT in the last 72 hours. Sepsis Labs: No results for input(s): PROCALCITON, LATICACIDVEN in the last 168 hours.  Recent Results (from the past 240 hour(s))  Culture, blood (routine x 2)     Status: None (Preliminary result)   Collection Time: 03/29/19  4:18 PM    Specimen: BLOOD  Result Value Ref Range Status   Specimen Description   Final    BLOOD LEFT ARM Performed at [facility], [place] [address], [place], [place] [address]    Special Requests   Final    BOTTLES DRAWN AEROBIC ONLY Blood Culture results may not be optimal due to an inadequate volume of blood received in culture bottles Performed at [facility] [address], [place],  [address]    Culture   Final    NO GROWTH 3 DAYS Performed at [facility]  [facility], [place] [address], [place], [place] [address]    Report Status PENDING  Incomplete  Culture, blood (routine x 2)     Status: None (Preliminary result)   Collection Time: 03/29/19  4:18 PM   Specimen: BLOOD LEFT HAND  Result Value Ref Range Status   Specimen Description   Final    BLOOD LEFT HAND Performed at [place] [address], [place], [place] [address]    Special Requests   Final    BOTTLES DRAWN AEROBIC ONLY Blood Culture adequate volume Performed at [place] [address] [place], [place] [address]    Culture   Final    NO GROWTH 3 DAYS Performed at [facility], [place] [address], [place], [place] [address]    Report Status PENDING  Incomplete  Urine Culture     Status: None   Collection Time: 03/30/19  5:07 PM   Specimen: Urine, Random  Result Value Ref Range Status   Specimen Description   Final    URINE, RANDOM Performed at [employer] [facility] [address], [place], [place] [address]    Special Requests   Final    NONE Performed at [facility], [place] [address], [place], [place] [address]    Culture   Final    NO GROWTH Performed at [facility], [place] [address] [place],  [address]    Report Status 03/31/2019 FINAL  Final         Radiology Studies: No results found.      Scheduled Meds: . Chlorhexidine Gluconate Cloth  6 each Topical Daily  .  insulin aspart  0-5 Units Subcutaneous QHS  . insulin aspart  0-6 Units Subcutaneous TID WC  . insulin aspart  2 Units Subcutaneous TID WC  . [START ON 04/02/2019] insulin glargine  12 Units Subcutaneous Daily  . metoCLOPramide (REGLAN) injection  10 mg Intravenous Q6H  . mirtazapine  15 mg Oral QHS  . pantoprazole (PROTONIX) IV  40 mg Intravenous Q12H  . polyethylene glycol  17 g Oral Daily  . rivaroxaban  10 mg Oral Daily  . senna  1 tablet Oral BID  . sodium chloride flush  10-40 mL Intracatheter Q12H  . sodium phosphate  1 enema Rectal Once   Continuous Infusions: . sodium chloride 100 mL/hr at 04/01/19 1300  . erythromycin Stopped (04/01/19 [id number])  . vancomycin Stopped (04/01/19 0711)     LOS: 16 days    Time spent: 35 minutes    Ameriah Lint A Norma Montemurro, MD [facility]   If 7PM-7AM, please contact night-coverage [email or web address]  04/01/2019, 3:56 PM

## 2019-04-01 NOTE — Progress Notes (Signed)
Subjective: The patient was seen and examined at bedside. She states she ate grilled chicken and mashed potatoes for lunch and dinner yesterday. Today morning she vomited large amount of food. She has been having loose bowel movements, last bowel movement was today. She denies nausea currently. She wants to continue eating solid food.  Objective: Vital signs in last 24 hours: Temp:  [97.8 F (36.6 C)-98.9 F (37.2 C)] 98.6 F (37 C) (03/12 2325) Pulse Rate:  [83-117] 86 (03/12 2325) Resp:  [11-20] 20 (03/12 2325) BP: (104-154)/(70-105) 111/81 (03/12 2325) SpO2:  [96 %-100 %] 98 % (03/12 2325) Weight:  [47.5 kg] 47.5 kg (03/13 0628) Weight change: -0.9 kg Last BM Date: 03/30/19  PE: Thinly built, Port-A-Cath over right chest GENERAL: Not in distress ABDOMEN: Soft, nondistended, nontender, normoactive bowel sounds EXTREMITIES: No deformity  Lab Results: Results for orders placed or performed during the hospital encounter of 03/16/19 (from the past 48 hour(s))  Glucose, capillary     Status: Abnormal   Collection Time: 03/30/19  7:51 AM  Result Value Ref Range   Glucose-Capillary 294 (H) 70 - 99 mg/dL    Comment: Glucose reference range applies only to samples taken after fasting for at least 8 hours.   Comment 1 Notify RN    Comment 2 Document in Chart   Glucose, capillary     Status: Abnormal   Collection Time: 03/30/19 11:45 AM  Result Value Ref Range   Glucose-Capillary 244 (H) 70 - 99 mg/dL    Comment: Glucose reference range applies only to samples taken after fasting for at least 8 hours.   Comment 1 Notify RN    Comment 2 Document in Chart   Glucose, capillary     Status: Abnormal   Collection Time: 03/30/19  4:32 PM  Result Value Ref Range   Glucose-Capillary 190 (H) 70 - 99 mg/dL    Comment: Glucose reference range applies only to samples taken after fasting for at least 8 hours.   Comment 1 Notify RN    Comment 2 Document in Chart   Urine Culture     Status:  None   Collection Time: 03/30/19  5:07 PM   Specimen: Urine, Random  Result Value Ref Range   Specimen Description      URINE, RANDOM Performed at [place] [address], [place], [place] [address]    Special Requests      NONE Performed at [facility], [place] [address], [place], [place] [address]    Culture      NO GROWTH Performed at [facility], [place] [address], [place], [place] [address]    Report Status 03/31/2019 FINAL   Glucose, capillary     Status: Abnormal   Collection Time: 03/30/19  9:55 PM  Result Value Ref Range   Glucose-Capillary 280 (H) 70 - 99 mg/dL    Comment: Glucose reference range applies only to samples taken after fasting for at least 8 hours.  Creatinine, serum     Status: None   Collection Time: 03/31/19  5:40 AM  Result Value Ref Range   Creatinine, Ser 0.63 0.44 - 1.00 mg/dL   GFR calc non Af Amer >60 >60 mL/min   GFR calc Af Amer >60 >60 mL/min    Comment: Performed at [facility], [place] [address], [place], [place] [address]  CBC     Status: Abnormal   Collection Time: 03/31/19  6:50 AM  Result Value  Ref Range   WBC 7.7 4.0 - 10.5 K/uL   RBC 3.25 (L) 3.87 - 5.11 MIL/uL   Hemoglobin 8.8 (L) 12.0 - 15.0 g/dL   HCT 28.7 (L) 36.0 - 46.0 %   MCV 88.3 80.0 - 100.0 fL   MCH 27.1 26.0 - 34.0 pg   MCHC 30.7 30.0 - 36.0 g/dL   RDW 16.0 (H) 11.5 - 15.5 %   Platelets 291 150 - 400 K/uL   nRBC 0.0 0.0 - 0.2 %    Comment: Performed at [facility], [place] [address], [place], [place] [address]  Comprehensive metabolic panel     Status: Abnormal   Collection Time: 03/31/19  6:50 AM  Result Value Ref Range   Sodium 133 (L) 135 - 145 mmol/L   Potassium 3.9 3.5 - 5.1 mmol/L   Chloride 102 98 - 111 mmol/L   CO2 23 22 - 32 mmol/L   Glucose, Bld 336 (H) 70 - 99 mg/dL    Comment: Glucose reference range applies only to samples taken after fasting for at least 8 hours.   BUN 8  6 - 20 mg/dL   Creatinine, Ser 0.59 0.44 - 1.00 mg/dL   Calcium 7.9 (L) 8.9 - 10.3 mg/dL   Total Protein 5.3 (L) 6.5 - 8.1 g/dL   Albumin 2.9 (L) 3.5 - 5.0 g/dL   AST 9 (L) 15 - 41 U/L   ALT 7 0 - 44 U/L   Alkaline Phosphatase 58 38 - 126 U/L   Total Bilirubin 0.9 0.3 - 1.2 mg/dL   GFR calc non Af Amer >60 >60 mL/min   GFR calc Af Amer >60 >60 mL/min   Anion gap 8 5 - 15    Comment: Performed at [place] [address], [place], [place] [address]  Glucose, capillary     Status: Abnormal   Collection Time: 03/31/19  7:46 AM  Result Value Ref Range   Glucose-Capillary 310 (H) 70 - 99 mg/dL    Comment: Glucose reference range applies only to samples taken after fasting for at least 8 hours.   Comment 1 Notify RN   Glucose, capillary     Status: Abnormal   Collection Time: 03/31/19 10:31 AM  Result Value Ref Range   Glucose-Capillary 208 (H) 70 - 99 mg/dL    Comment: Glucose reference range applies only to samples taken after fasting for at least 8 hours.  Glucose, capillary     Status: Abnormal   Collection Time: 03/31/19 11:58 AM  Result Value Ref Range   Glucose-Capillary 202 (H) 70 - 99 mg/dL    Comment: Glucose reference range applies only to samples taken after fasting for at least 8 hours.  Glucose, capillary     Status: Abnormal   Collection Time: 03/31/19  5:06 PM  Result Value Ref Range   Glucose-Capillary 148 (H) 70 - 99 mg/dL    Comment: Glucose reference range applies only to samples taken after fasting for at least 8 hours.  Glucose, capillary     Status: Abnormal   Collection Time: 03/31/19  9:29 PM  Result Value Ref Range   Glucose-Capillary 279 (H) 70 - 99 mg/dL    Comment: Glucose reference range applies only to samples taken after fasting for at least 8 hours.  Glucose, capillary     Status: Abnormal   Collection Time: 04/01/19  7:31 AM  Result Value Ref Range   Glucose-Capillary 243 (H) 70 - 99 mg/dL  Comment: Glucose reference  range applies only to samples taken after fasting for at least 8 hours.   Comment 1 Notify RN     Studies/Results: No results found.  Medications: I have reviewed the patient's current medications.  Assessment: Large amount of fluid noted on endoscopy consistent with gastroparesis likely related to underlying diabetes Prior gastric emptying scan from 06/2016 is also compatible with gastroparesis Blood sugar remains elevated and is 243 today morning  Normocytic anemia  Malnutrition, albumin 2.9, total protein 5.3  Increased stool burden noted on CAT scan, for which patient is on senna twice a day, has received 1 Fleet enema, MiraLAX once a day, had received milk of magnesia x1 on 03/31/2019  Coagulase-negative positive blood culture from Port-A-Cath, on IV vancomycin  Plan: Patient wants to continue soft diet-recommend small, frequent meals of low fiber, low residue diet and patient needs good blood sugar control Recommend avoid narcotics, which will worsen gastroparesis, currently she is on Dilaudid 0.5 mg every 12 hours as needed and hydrocodone 1 to 2 tablets every 6 hours as needed. Continue Reglan 10 mg every 6 hours, she is also on Phenergan 25 mg IV every 4 hours as needed. We will start patient on erythromycin 250 mg 3 times daily, to see if that helps as a prokinetic agent for gastroparesis.    Ronnette Juniper, MD 04/01/2019, 7:41 AM

## 2019-04-01 NOTE — Progress Notes (Signed)
Pharmacy Antibiotic Note  [name] is a 29 y.o. female admitted on 03/16/2019 with potential CoNS bacteremia.  Pharmacy was consulted for Vancomycin dosing. Note that vanc planned for 3 to 5 days per ID recommendation.  Plan: Day 4 antibiotics Vancomycin 1g IV x 1 then 750mg  IV q12, AUC 542, using SCr 0.8 and TBW SCr remains low  Height: 5\' 4"  (162.6 cm) Weight: 104 lb 11.5 oz (47.5 kg) IBW/kg (Calculated) : 54.7  Temp (24hrs), Avg:98.5 F (36.9 C), Min:97.8 F (36.6 C), Max:98.9 F (37.2 C)  Recent Labs  Lab 03/25/19 0844 03/25/19 0844 03/28/19 0600 03/29/19 0525 03/30/19 0437 03/31/19 0540 03/31/19 0650  WBC 5.9  --  5.4 5.3  --   --  7.7  CREATININE 0.59   < > 0.66 0.63 0.63 0.63 0.59   < > = values in this interval not displayed.    Estimated Creatinine Clearance: 77.8 mL/min (by C-G formula based on SCr of 0.59 mg/dL).    Allergies  Allergen Reactions  . Peanut-Containing Drug Products Swelling and Other (See Comments)    Reaction:  Swelling of mouth and lips   . 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   Reaction:  Swelling of mouth and lips   . Ultram [Tramadol] Itching    Microbiology results:  2/25 BCx (R chest PAC): anaerobic bottle only: CoNS - d/w [place]>  prob contaminant 2/25 UCx: 70K Corynebacterium sp  3/10 repeat BCx: ngtd 3/11 UCx: ng-final  Thank you for allowing pharmacy to be a part of this patient's care.  Minda Ditto PharmD 04/01/2019 7:46 AM

## 2019-04-02 ENCOUNTER — Encounter: Payer: Self-pay | Admitting: *Deleted

## 2019-04-02 LAB — GLUCOSE, CAPILLARY
Glucose-Capillary: 194 mg/dL — ABNORMAL HIGH (ref 70–99)
Glucose-Capillary: 234 mg/dL — ABNORMAL HIGH (ref 70–99)
Glucose-Capillary: 81 mg/dL (ref 70–99)
Glucose-Capillary: 84 mg/dL (ref 70–99)

## 2019-04-02 LAB — CREATININE, SERUM
Creatinine, Ser: 0.5 mg/dL (ref 0.44–1.00)
GFR calc Af Amer: >60 mL/min (ref >60)
GFR calc non Af Amer: >60 mL/min (ref >60)

## 2019-04-02 MED ORDER — HYDROMORPHONE HCL 2 MG/ML IJ SOLN
0.5000 mg | Freq: Once | INTRAMUSCULAR | Status: AC
  Administered 2019-04-02: 0.5 mg via INTRAVENOUS
  Filled 2019-04-02: qty 1

## 2019-04-02 NOTE — Progress Notes (Signed)
Subjective: Patient states she had nausea and vomiting today morning as well. She is on soft diet, which she had chicken and mashed potato for dinner yesterday, and apple juice today morning.  Objective: Vital signs in last 24 hours: Temp:  [98.5 F (36.9 C)-99.7 F (37.6 C)] 99 F (37.2 C) (03/14 1254) Pulse Rate:  [93-104] 100 (03/14 1254) Resp:  [14-20] 20 (03/14 1254) BP: (122-151)/(89-110) 122/89 (03/14 1254) SpO2:  [96 %-99 %] 99 % (03/14 1254) Weight change:  Last BM Date: 03/31/19  PE: Thinly built, appears malnourished GENERAL: Prominent pallor, no icterus ABDOMEN: Soft, nondistended, nontender EXTREMITIES: no deformity  Lab Results: Results for orders placed or performed during the hospital encounter of 03/16/19 (from the past 48 hour(s))  Glucose, capillary     Status: Abnormal   Collection Time: 03/31/19  5:06 PM  Result Value Ref Range   Glucose-Capillary 148 (H) 70 - 99 mg/dL    Comment: Glucose reference range applies only to samples taken after fasting for at least 8 hours.  Glucose, capillary     Status: Abnormal   Collection Time: 03/31/19  9:29 PM  Result Value Ref Range   Glucose-Capillary 279 (H) 70 - 99 mg/dL    Comment: Glucose reference range applies only to samples taken after fasting for at least 8 hours.  Creatinine, serum     Status: None   Collection Time: 04/01/19  5:47 AM  Result Value Ref Range   Creatinine, Ser 0.46 0.44 - 1.00 mg/dL   GFR calc non Af Amer >60 >60 mL/min   GFR calc Af Amer >60 >60 mL/min    Comment: Performed at [facility], [place] [address], [place], [place] [address]  Glucose, capillary     Status: Abnormal   Collection Time: 04/01/19  7:31 AM  Result Value Ref Range   Glucose-Capillary 243 (H) 70 - 99 mg/dL    Comment: Glucose reference range applies only to samples taken after fasting for at least 8 hours.   Comment 1 Notify RN   Basic metabolic panel     Status: Abnormal   Collection Time:  04/01/19 11:43 AM  Result Value Ref Range   Sodium 136 135 - 145 mmol/L   Potassium 3.7 3.5 - 5.1 mmol/L   Chloride 104 98 - 111 mmol/L   CO2 26 22 - 32 mmol/L   Glucose, Bld 175 (H) 70 - 99 mg/dL    Comment: Glucose reference range applies only to samples taken after fasting for at least 8 hours.   BUN 6 6 - 20 mg/dL   Creatinine, Ser 0.47 0.44 - 1.00 mg/dL   Calcium 8.2 (L) 8.9 - 10.3 mg/dL   GFR calc non Af Amer >60 >60 mL/min   GFR calc Af Amer >60 >60 mL/min   Anion gap 6 5 - 15    Comment: Performed at [facility], [place] [address] [place], [place] [address]  Glucose, capillary     Status: Abnormal   Collection Time: 04/01/19 11:50 AM  Result Value Ref Range   Glucose-Capillary 154 (H) 70 - 99 mg/dL    Comment: Glucose reference range applies only to samples taken after fasting for at least 8 hours.   Comment 1 Notify RN   Glucose, capillary     Status: None   Collection Time: 04/01/19  5:13 PM  Result Value Ref Range   Glucose-Capillary 73 70 - 99 mg/dL    Comment: Glucose reference range applies only  to samples taken after fasting for at least 8 hours.  Glucose, capillary     Status: Abnormal   Collection Time: 04/01/19  9:32 PM  Result Value Ref Range   Glucose-Capillary 284 (H) 70 - 99 mg/dL    Comment: Glucose reference range applies only to samples taken after fasting for at least 8 hours.  Creatinine, serum     Status: None   Collection Time: 04/02/19  6:22 AM  Result Value Ref Range   Creatinine, Ser 0.50 0.44 - 1.00 mg/dL   GFR calc non Af Amer >60 >60 mL/min   GFR calc Af Amer >60 >60 mL/min    Comment: Performed at [facility], [place] [address] [place], [place] [address]  Glucose, capillary     Status: Abnormal   Collection Time: 04/02/19  7:51 AM  Result Value Ref Range   Glucose-Capillary 234 (H) 70 - 99 mg/dL    Comment: Glucose reference range applies only to samples taken after fasting for at least 8 hours.   Glucose, capillary     Status: Abnormal   Collection Time: 04/02/19 12:05 PM  Result Value Ref Range   Glucose-Capillary 194 (H) 70 - 99 mg/dL    Comment: Glucose reference range applies only to samples taken after fasting for at least 8 hours.    Studies/Results: No results found.  Medications: I have reviewed the patient's current medications.  Assessment: Gastroparesis-amount of food residue noted on EGD from 03/31/2019, biopsies pending Normocytic anemia Malnutrition, albumin 2.9, total protein 5.3  Plan: I have discussed with patient that she will benefit from a nasojejunal feeding tube placement by IR, as I am worried about her nutritional status with ongoing nausea and vomiting on a daily basis.  Continue erythromycin to 50 mg IV every 8 hours, started on 04/01/2019,  continue Reglan 10 mg every 6 hours, started on 03/21/2019  along with Zofran as needed and Phenergan as needed.  Agree with Dr. Paulina Fusi recommendation to wean off narcotics, currently on hydrocodone/acetaminophen 1 tablet every 8 hours as needed  Ronnette Juniper, MD 04/02/2019, 1:16 PM

## 2019-04-02 NOTE — Progress Notes (Signed)
PROGRESS NOTE    [name]  [id number] DOB: Sep 24, 1990 DOA: 03/16/2019 PCP: Vicenta Aly, FNP   Brief Narrative: 29 year old with past medical significant for type 1 diabetes on insulin pump, GERD, asthma, diabetes gastroparesis, intractable cyclical vomiting, hypertension, history of previous DKA and pancreatitis who presents secondary to nausea vomiting with concern of gastroparesis flare.  Patient currently receiving antiemetics, IV fluids and IV analgesic.  Plan was to proceed with opioid weaning to prevent worsening of diabetes gastroparesis.    Assessment & Plan:   Active Problems:   Diabetic gastroparesis (HCC)   Intractable nausea and vomiting   Diabetes mellitus (HCC)  1-Intractable nausea and vomiting: Not to be related to gastroparesis.  Prior history of cholecystectomy. Several admissions and ED visits for similar symptoms. Continue with IV Reglan, and as needed Phenergan. Weaning narcotics. Plan to stop dilaudid today.  Endoscopy showed retain food stomach, no anatomical cause for nausea, vomiting.  Plan to start diet, continue with Reglan.  Started on Erythromycin 3-13.  2-Abdominal pain: Secondary to above, #1.  Also concern for opioid induced pain.  Plan to taper off of narcotics.  3-Diabetes type 1: With hypoglycemia Insulin pump off. Continue with meal coverage Hyperglycemia, increase lantus to 12 units.   Hypokalemia: Replaced.   Anemia, iron deficiency: Continue with iron supplements  Constipation: Continue with MiraLAX Senokot and suppository KUB with moderate colonic stool burden on 3/7 Had BM.   Neurogenic bladder: She performed self catheterization. Bladder scan. Patient report she has been urinating.   Pyuria;  Urine culture no growth to date,.   Blood culture positive for staphylococcus Coagulase negative. From port catheter.  Repeated blood culture; No growth to date.  Discussed with ID, plan to treat with IV vancomycin  for 3-- 5 five days.  Day 4./5   Estimated body mass index is 17.97 kg/m as calculated from the following:   Height as of this encounter: 5\' 4"  (1.626 m).   Weight as of this encounter: 47.5 kg.   DVT prophylaxis: On xarelto , patient refuse Lovenox. Not on anticoagulation prior to admission Code Status: Full code Family Communication: Care discussed with Mother 3-12 Disposition Plan:  Patient is from: Home  Anticipated d/c date: Home when tolerates diet.  Barriers to d/c or necessity for inpatient status: still with persistent nausea and vomiting. Not tolerating oral intake  Consultants:   GI  Procedures:   none  Antimicrobials:    Subjective: She is complaining of abdominal pain, asking for IV dilaudid. Discussed with her, will give one time dose today and stop it.  She vomited this am,  Objective: Vitals:   04/01/19 1332 04/01/19 2133 04/02/19 0502 04/02/19 1254  BP: (!) 127/97 (!) 151/110 (!) 133/97 122/89  Pulse: 93 (!) 104 (!) 101 100  Resp: 14 18 18 20   Temp: 98.8 F (37.1 C) 99.7 F (37.6 C) 98.5 F (36.9 C) 99 F (37.2 C)  TempSrc: Oral Oral Oral Oral  SpO2: 99% 96% 98% 99%  Weight:      Height:       No intake or output data in the 24 hours ending 04/02/19 1543 Filed Weights   03/30/19 0700 03/31/19 0603 04/01/19 0628  Weight: 48 kg 48.4 kg 47.5 kg    Examination:  General exam: NAD Respiratory system: CTA Cardiovascular system: S 1, S 2 RRR Gastrointestinal system: BS present, soft Central nervous system: Non focal.  Extremities: No edema    Data Reviewed: I have personally reviewed following labs and  imaging studies  CBC: Recent Labs  Lab 03/28/19 0600 03/29/19 0525 03/31/19 0650  WBC 5.4 5.3 7.7  HGB 9.5* 9.3* 8.8*  HCT 30.9* 30.1* 28.7*  MCV 86.8 86.5 88.3  PLT 335 308 [id number]   Basic Metabolic Panel: Recent Labs  Lab 03/28/19 0600 03/28/19 0600 03/29/19 0525 03/29/19 0525 03/30/19 0437 03/30/19 0437 03/31/19 0540  03/31/19 0650 04/01/19 0547 04/01/19 1143 04/02/19 0622  NA 136  --  136  --  134*  --   --  133*  --  136  --   K 3.6  --  3.3*  --  4.1  --   --  3.9  --  3.7  --   CL 102  --  103  --  103  --   --  102  --  104  --   CO2 25  --  27  --  25  --   --  23  --  26  --   GLUCOSE 174*  --  56*  --  335*  --   --  336*  --  175*  --   BUN 11  --  11  --  8  --   --  8  --  6  --   CREATININE 0.66   < > 0.63   < > 0.63   < > 0.63 0.59 0.46 0.47 0.50  CALCIUM 8.4*  --  8.1*  --  8.2*  --   --  7.9*  --  8.2*  --   MG 1.4*  --  1.9  --   --   --   --   --   --   --   --    < > = values in this interval not displayed.   GFR: Estimated Creatinine Clearance: 77.8 mL/min (by C-G formula based on SCr of 0.5 mg/dL). Liver Function Tests: Recent Labs  Lab 03/31/19 0650  AST 9*  ALT 7  ALKPHOS 58  BILITOT 0.9  PROT 5.3*  ALBUMIN 2.9*   No results for input(s): LIPASE, AMYLASE in the last 168 hours. No results for input(s): AMMONIA in the last 168 hours. Coagulation Profile: No results for input(s): INR, PROTIME in the last 168 hours. Cardiac Enzymes: No results for input(s): CKTOTAL, CKMB, CKMBINDEX, TROPONINI in the last 168 hours. BNP (last 3 results) No results for input(s): PROBNP in the last 8760 hours. HbA1C: No results for input(s): HGBA1C in the last 72 hours. CBG: Recent Labs  Lab 04/01/19 1150 04/01/19 1713 04/01/19 2132 04/02/19 0751 04/02/19 1205  GLUCAP 154* 73 284* 234* 194*   Lipid Profile: No results for input(s): CHOL, HDL, LDLCALC, TRIG, CHOLHDL, LDLDIRECT in the last 72 hours. Thyroid Function Tests: No results for input(s): TSH, T4TOTAL, FREET4, T3FREE, THYROIDAB in the last 72 hours. Anemia Panel: No results for input(s): VITAMINB12, FOLATE, FERRITIN, TIBC, IRON, RETICCTPCT in the last 72 hours. Sepsis Labs: No results for input(s): PROCALCITON, LATICACIDVEN in the last 168 hours.  Recent Results (from the past 240 hour(s))  Culture, blood (routine x  2)     Status: None (Preliminary result)   Collection Time: 03/29/19  4:18 PM   Specimen: BLOOD  Result Value Ref Range Status   Specimen Description   Final    BLOOD LEFT ARM Performed at [facility], [place] [address], [place], [place] [address]    Special Requests   Final    BOTTLES DRAWN AEROBIC ONLY  Blood Culture results may not be optimal due to an inadequate volume of blood received in culture bottles Performed at [facility] [address] [place], [place] [address]    Culture   Final    NO GROWTH 4 DAYS Performed at [facility], [place] [address] [place], [place] [address]    Report Status PENDING  Incomplete  Culture, blood (routine x 2)     Status: None (Preliminary result)   Collection Time: 03/29/19  4:18 PM   Specimen: BLOOD LEFT HAND  Result Value Ref Range Status   Specimen Description   Final    BLOOD LEFT HAND Performed at [facility] [address] [place], [place] [address]    Special Requests   Final    BOTTLES DRAWN AEROBIC ONLY Blood Culture adequate volume Performed at [facility] [address] [place], [place] [address]    Culture   Final    NO GROWTH 4 DAYS Performed at [facility], [place] [address] [place], [place] [address]    Report Status PENDING  Incomplete  Urine Culture     Status: None   Collection Time: 03/30/19  5:07 PM   Specimen: Urine, Random  Result Value Ref Range Status   Specimen Description   Final    URINE, RANDOM Performed at [facility] [address], [place], [place] [address]    Special Requests   Final    NONE Performed at [facility], [place] [address], [place], [place] [address]    Culture   Final    NO GROWTH Performed at [facility], [place] [address] [place], [place] [address]    Report Status 03/31/2019 FINAL  Final         Radiology Studies: No results  found.      Scheduled Meds: . Chlorhexidine Gluconate Cloth  6 each Topical Daily  . insulin aspart  0-5 Units Subcutaneous QHS  . insulin aspart  0-6 Units Subcutaneous TID WC  . insulin aspart  2 Units Subcutaneous TID WC  . insulin glargine  12 Units Subcutaneous Daily  . metoCLOPramide (REGLAN) injection  10 mg Intravenous Q6H  . mirtazapine  15 mg Oral QHS  . pantoprazole (PROTONIX) IV  40 mg Intravenous Q12H  . polyethylene glycol  17 g Oral Daily  . rivaroxaban  10 mg Oral Daily  . senna  1 tablet Oral BID  . sodium chloride flush  10-40 mL Intracatheter Q12H  . sodium phosphate  1 enema Rectal Once   Continuous Infusions: . sodium chloride 100 mL/hr at 04/02/19 0621  . erythromycin 250 mg (04/02/19 0815)  . vancomycin 750 mg (04/02/19 0631)     LOS: 17 days    Time spent: 35 minutes    Raquan Iannone A Apolinar Bero, MD [facility]   If 7PM-7AM, please contact night-coverage [email or web address]  04/02/2019, 3:43 PM

## 2019-04-03 ENCOUNTER — Other Ambulatory Visit: Payer: Self-pay

## 2019-04-03 ENCOUNTER — Inpatient Hospital Stay (HOSPITAL_COMMUNITY): Payer: BC Managed Care – PPO

## 2019-04-03 LAB — CBC
HCT: 31.3 % — ABNORMAL LOW (ref 36.0–46.0)
Hemoglobin: 9.7 g/dL — ABNORMAL LOW (ref 12.0–15.0)
MCH: 27.2 pg (ref 26.0–34.0)
MCHC: 31 g/dL (ref 30.0–36.0)
MCV: 87.7 fL (ref 80.0–100.0)
Platelets: 359 10*3/uL (ref 150–400)
RBC: 3.57 MIL/uL — ABNORMAL LOW (ref 3.87–5.11)
RDW: 16.7 % — ABNORMAL HIGH (ref 11.5–15.5)
WBC: 6.3 10*3/uL (ref 4.0–10.5)
nRBC: 0 % (ref 0.0–0.2)

## 2019-04-03 LAB — BASIC METABOLIC PANEL
Anion gap: 12 (ref 5–15)
BUN: 7 mg/dL (ref 6–20)
CO2: 19 mmol/L — ABNORMAL LOW (ref 22–32)
Calcium: 8.4 mg/dL — ABNORMAL LOW (ref 8.9–10.3)
Chloride: 102 mmol/L (ref 98–111)
Creatinine, Ser: 0.64 mg/dL (ref 0.44–1.00)
GFR calc Af Amer: >60 mL/min (ref >60)
GFR calc non Af Amer: >60 mL/min (ref >60)
Glucose, Bld: 280 mg/dL — ABNORMAL HIGH (ref 70–99)
Potassium: 3.6 mmol/L (ref 3.5–5.1)
Sodium: 133 mmol/L — ABNORMAL LOW (ref 135–145)

## 2019-04-03 LAB — TROPONIN I (HIGH SENSITIVITY)
Troponin I (High Sensitivity): <2 ng/L (ref <18)
Troponin I (High Sensitivity): 2 ng/L (ref <18)

## 2019-04-03 LAB — GLUCOSE, CAPILLARY
Glucose-Capillary: 267 mg/dL — ABNORMAL HIGH (ref 70–99)
Glucose-Capillary: 165 mg/dL — ABNORMAL HIGH (ref 70–99)
Glucose-Capillary: 76 mg/dL (ref 70–99)
Glucose-Capillary: 37 mg/dL — CL (ref 70–99)
Glucose-Capillary: 167 mg/dL — ABNORMAL HIGH (ref 70–99)

## 2019-04-03 LAB — CULTURE, BLOOD (ROUTINE X 2)
Culture: NO GROWTH
Culture: NO GROWTH
Special Requests: ADEQUATE

## 2019-04-03 MED ORDER — SODIUM CHLORIDE 0.9 % IV BOLUS
500.0000 mL | Freq: Once | INTRAVENOUS | Status: AC
  Administered 2019-04-03: 500 mL via INTRAVENOUS

## 2019-04-03 MED ORDER — SODIUM CHLORIDE 0.9 % IV BOLUS: 500.0000 mL | Freq: Once | INTRAVENOUS | Status: AC

## 2019-04-03 MED ORDER — DEXTROSE 50 % IV SOLN
25.0000 g | INTRAVENOUS | Status: AC
  Administered 2019-04-03: 25 g via INTRAVENOUS
  Filled 2019-04-03: qty 50

## 2019-04-03 MED ORDER — HYDROMORPHONE HCL 2 MG/ML IJ SOLN
0.5000 mg | Freq: Once | INTRAMUSCULAR | Status: AC
  Administered 2019-04-03: 0.5 mg via INTRAVENOUS
  Filled 2019-04-03: qty 1

## 2019-04-03 MED ORDER — KETOROLAC TROMETHAMINE 15 MG/ML IJ SOLN
15.0000 mg | Freq: Four times a day (QID) | INTRAMUSCULAR | Status: AC | PRN
  Administered 2019-04-03 – 2019-04-07 (×10): 15 mg via INTRAVENOUS
  Filled 2019-04-03 (×11): qty 1

## 2019-04-03 NOTE — Progress Notes (Signed)
NASOJEJUNAL FEEDING TUBE placed by ICU nurse, in RT nare. X-ray taken and showed feeding tube is not in stomach. Spoke with Dominica Severin in radiology and he said they were going to wait over night to see if the tube advanced itself into the stomach. X-ray will be repeated in AM. Dr. Tyrell Antonio informed. Wire has not been removed from feeding tube.

## 2019-04-03 NOTE — Progress Notes (Signed)
PROGRESS NOTE    [name]  [id number] DOB: 04-30-1990 DOA: 03/16/2019 PCP: Vicenta Aly, FNP   Brief Narrative: 29 year old with past medical significant for type 1 diabetes on insulin pump, GERD, asthma, diabetes gastroparesis, intractable cyclical vomiting, hypertension, history of previous DKA and pancreatitis who presents secondary to nausea vomiting with concern of gastroparesis flare.  Patient currently receiving antiemetics, IV fluids and IV analgesic.  Plan was to proceed with opioid weaning to prevent worsening of diabetes gastroparesis.    Assessment & Plan:   Active Problems:   Diabetic gastroparesis (HCC)   Intractable nausea and vomiting   Diabetes mellitus (HCC)  1-Intractable nausea and vomiting: Not to be related to gastroparesis.  Prior history of cholecystectomy. Several admissions and ED visits for similar symptoms. Continue with IV Reglan, and as needed Phenergan. Weaning narcotics. Plan to stop dilaudid today.  Endoscopy showed retain food stomach, no anatomical cause for nausea, vomiting.  Plan to start diet, continue with Reglan.  Started on Erythromycin 3-13. Plan for Jejunal feeding. Plan to start tube feeding.   2-Abnormal EKG, Tachycardia, ST elevation;  EKG read as pericarditis, Patient denies chest pain.  Check ECHO and Troponin.   Abdominal pain: Secondary to above, #1.  Also concern for opioid induced pain.  Plan to taper off of narcotics.  3-Diabetes type 1: With hypoglycemia Insulin pump off. Continue with meal coverage Continue with  lantus to 12 units.   Hypokalemia: Replaced.   Anemia, iron deficiency: Continue with iron supplements  Constipation: Continue with MiraLAX Senokot and suppository KUB with moderate colonic stool burden on 3/7 Had BM.   Neurogenic bladder: She performed self catheterization. Bladder scan. Patient report she has been urinating.   Pyuria;  Urine culture no growth to date,.   Blood  culture positive for staphylococcus Coagulase negative. From port catheter.  Repeated blood culture; No growth to date.  Discussed with ID, plan to treat with IV vancomycin for 3-- 5 five days.  Day 5./5   Estimated body mass index is 17.97 kg/m as calculated from the following:   Height as of this encounter: 5\' 4"  (1.626 m).   Weight as of this encounter: 47.5 kg.   DVT prophylaxis: On xarelto , patient refuse Lovenox. Not on anticoagulation prior to admission Code Status: Full code Family Communication: Care discussed with Mother 3-15 Disposition Plan:  Patient is from: Home  Anticipated d/c date: Home when tolerates diet.  Barriers to d/c or necessity for inpatient status: still with persistent nausea and vomiting. Not tolerating oral intake  Consultants:   GI  Procedures:   none  Antimicrobials:    Subjective: She vomited twice today.  Complaining of abdominal pain.  Denies chest pain.   Objective: Vitals:   04/03/19 0654 04/03/19 0902 04/03/19 1100 04/03/19 1102  BP: (!) 138/96 124/77 (!) 124/94   Pulse: (!) 113 (!) 111 (!) 114 (!) 121  Resp: 18 16 14 15   Temp: 99.7 F (37.6 C) 98.7 F (37.1 C) 99.7 F (37.6 C)   TempSrc: Oral Oral Oral   SpO2: 98% (!) 89%  99%  Weight:      Height:        Intake/Output Summary (Last 24 hours) at 04/03/2019 1357 Last data filed at 04/03/2019 1105 Gross per 24 hour  Intake 5468.79 ml  Output 1 ml  Net 5467.79 ml   Filed Weights   03/30/19 0700 03/31/19 0603 04/01/19 0628  Weight: 48 kg 48.4 kg 47.5 kg    Examination:  General exam: NAD Respiratory system: CTA Cardiovascular system: S 1, S 2 RRR Gastrointestinal system: BS present, soft, nt Central nervous system: Non focal.  Extremities; no edema    Data Reviewed: I have personally reviewed following labs and imaging studies  CBC: Recent Labs  Lab 03/28/19 0600 03/29/19 0525 03/31/19 0650 04/03/19 0600  WBC 5.4 5.3 7.7 6.3  HGB 9.5* 9.3* 8.8* 9.7*   HCT 30.9* 30.1* 28.7* 31.3*  MCV 86.8 86.5 88.3 87.7  PLT 335 308 291 [id number]   Basic Metabolic Panel: Recent Labs  Lab 03/28/19 0600 03/28/19 0600 03/29/19 0525 03/29/19 0525 03/30/19 0437 03/31/19 0540 03/31/19 0650 04/01/19 0547 04/01/19 1143 04/02/19 0622 04/03/19 0600  NA 136   < > 136  --  134*  --  133*  --  136  --  133*  K 3.6   < > 3.3*  --  4.1  --  3.9  --  3.7  --  3.6  CL 102   < > 103  --  103  --  102  --  104  --  102  CO2 25   < > 27  --  25  --  23  --  26  --  19*  GLUCOSE 174*   < > 56*  --  335*  --  336*  --  175*  --  280*  BUN 11   < > 11  --  8  --  8  --  6  --  7  CREATININE 0.66   < > 0.63   < > 0.63   < > 0.59 0.46 0.47 0.50 0.64  CALCIUM 8.4*   < > 8.1*  --  8.2*  --  7.9*  --  8.2*  --  8.4*  MG 1.4*  --  1.9  --   --   --   --   --   --   --   --    < > = values in this interval not displayed.   GFR: Estimated Creatinine Clearance: 77.8 mL/min (by C-G formula based on SCr of 0.64 mg/dL). Liver Function Tests: Recent Labs  Lab 03/31/19 0650  AST 9*  ALT 7  ALKPHOS 58  BILITOT 0.9  PROT 5.3*  ALBUMIN 2.9*   No results for input(s): LIPASE, AMYLASE in the last 168 hours. No results for input(s): AMMONIA in the last 168 hours. Coagulation Profile: No results for input(s): INR, PROTIME in the last 168 hours. Cardiac Enzymes: No results for input(s): CKTOTAL, CKMB, CKMBINDEX, TROPONINI in the last 168 hours. BNP (last 3 results) No results for input(s): PROBNP in the last 8760 hours. HbA1C: No results for input(s): HGBA1C in the last 72 hours. CBG: Recent Labs  Lab 04/02/19 1205 04/02/19 1658 04/02/19 2336 04/03/19 0745 04/03/19 1222  GLUCAP 194* 81 84 267* 165*   Lipid Profile: No results for input(s): CHOL, HDL, LDLCALC, TRIG, CHOLHDL, LDLDIRECT in the last 72 hours. Thyroid Function Tests: No results for input(s): TSH, T4TOTAL, FREET4, T3FREE, THYROIDAB in the last 72 hours. Anemia Panel: No results for input(s):  VITAMINB12, FOLATE, FERRITIN, TIBC, IRON, RETICCTPCT in the last 72 hours. Sepsis Labs: No results for input(s): PROCALCITON, LATICACIDVEN in the last 168 hours.  Recent Results (from the past 240 hour(s))  Culture, blood (routine x 2)     Status: None   Collection Time: 03/29/19  4:18 PM   Specimen: BLOOD  Result Value Ref Range Status   Specimen  Description BLOOD LEFT ARM  Final   Special Requests   Final    BOTTLES DRAWN AEROBIC ONLY Blood Culture results may not be optimal due to an inadequate volume of blood received in culture bottles Performed at [facility] [address] [place], [place] [address]    Culture NO GROWTH 5 DAYS  Final   Report Status 04/03/2019 FINAL  Final  Culture, blood (routine x 2)     Status: None   Collection Time: 03/29/19  4:18 PM   Specimen: BLOOD LEFT HAND  Result Value Ref Range Status   Specimen Description BLOOD LEFT HAND  Final   Special Requests   Final    BOTTLES DRAWN AEROBIC ONLY Blood Culture adequate volume Performed at [facility] [address], [place], [place] [address]    Culture NO GROWTH 5 DAYS  Final   Report Status 04/03/2019 FINAL  Final  Urine Culture     Status: None   Collection Time: 03/30/19  5:07 PM   Specimen: Urine, Random  Result Value Ref Range Status   Specimen Description   Final    URINE, RANDOM Performed at [facility] [address], [place], [place] [address]    Special Requests   Final    NONE Performed at [facility], [place] [address] [place], [place] [address]    Culture   Final    NO GROWTH Performed at [facility], [place] [address], [place], [place] [address]    Report Status 03/31/2019 FINAL  Final         Radiology Studies: No results found.      Scheduled Meds: . Chlorhexidine Gluconate Cloth  6 each Topical Daily  . insulin aspart  0-5 Units Subcutaneous QHS  . insulin aspart  0-6 Units  Subcutaneous TID WC  . insulin aspart  2 Units Subcutaneous TID WC  . insulin glargine  12 Units Subcutaneous Daily  . metoCLOPramide (REGLAN) injection  10 mg Intravenous Q6H  . mirtazapine  15 mg Oral QHS  . pantoprazole (PROTONIX) IV  40 mg Intravenous Q12H  . polyethylene glycol  17 g Oral Daily  . rivaroxaban  10 mg Oral Daily  . senna  1 tablet Oral BID  . sodium chloride flush  10-40 mL Intracatheter Q12H   Continuous Infusions: . sodium chloride 100 mL/hr at 04/02/19 1900  . erythromycin 250 mg (04/03/19 1319)  . vancomycin 750 mg (04/03/19 0606)     LOS: 18 days    Time spent: 35 minutes    Whitlee Sluder A Gorgeous Newlun, MD [facility]   If 7PM-7AM, please contact night-coverage [email or web address]  04/03/2019, 1:57 PM

## 2019-04-03 NOTE — Progress Notes (Addendum)
Emmogene Hellberg scored a Yellow 2 Mews this am. [facility], [place], Cassie Freer, MD notified of the above. See Mews flowsheet.

## 2019-04-03 NOTE — Progress Notes (Signed)
[facility] Progress Note  [name] 29 y.o. October 23, 1990   Subjective: Patient shaking all over complaining of severe abdominal pain and recurrent vomiting. Reports loose stools. Nurse tech chaperone during my evaluation.  Objective: Vital signs: Vitals:   04/03/19 0654 04/03/19 0902  BP: (!) 138/96 124/77  Pulse: (!) 113 (!) 111  Resp: 18 16  Temp: 99.7 F (37.6 C) 98.7 F (37.1 C)  SpO2: 98% (!) 89%    Physical Exam: Gen: lethargic, cachetic, +acute distress  HEENT: anicteric sclera CV: RRR Chest: CTA B Abd: diffuse tenderness with guarding, flat, +BS Ext: no edema  Lab Results: Recent Labs    04/01/19 1143 04/01/19 1143 04/02/19 0622 04/03/19 0600  NA 136  --   --  133*  K 3.7  --   --  3.6  CL 104  --   --  102  CO2 26  --   --  19*  GLUCOSE 175*  --   --  280*  BUN 6  --   --  7  CREATININE 0.47   < > 0.50 0.64  CALCIUM 8.2*  --   --  8.4*   < > = values in this interval not displayed.   No results for input(s): AST, ALT, ALKPHOS, BILITOT, PROT, ALBUMIN in the last 72 hours. Recent Labs    04/03/19 0600  WBC 6.3  HGB 9.7*  HCT 31.3*  MCV 87.7  PLT 359      Assessment/Plan: Diabetic gastroparesis with malnutrition - needs nasojejunal feeding placed by IR to help with nutrition and she understands that she will need to lie still during the procedure. Continue supportive care and continue Reglan and Erythromycin. Avoid narcotics.    [name] 04/03/2019, 9:29 AM  Questions please call [phone] ID: [name], female   DOB: 05-12-1990, 29 y.o.   MRN: [id number]

## 2019-04-04 ENCOUNTER — Inpatient Hospital Stay (HOSPITAL_COMMUNITY): Payer: BC Managed Care – PPO

## 2019-04-04 DIAGNOSIS — I340 Nonrheumatic mitral (valve) insufficiency: Secondary | ICD-10-CM

## 2019-04-04 LAB — COMPREHENSIVE METABOLIC PANEL
ALT: 9 U/L (ref 0–44)
AST: 10 U/L — ABNORMAL LOW (ref 15–41)
Albumin: 3.4 g/dL — ABNORMAL LOW (ref 3.5–5.0)
Alkaline Phosphatase: 64 U/L (ref 38–126)
Anion gap: 9 (ref 5–15)
BUN: 7 mg/dL (ref 6–20)
CO2: 20 mmol/L — ABNORMAL LOW (ref 22–32)
Calcium: 8.4 mg/dL — ABNORMAL LOW (ref 8.9–10.3)
Chloride: 108 mmol/L (ref 98–111)
Creatinine, Ser: 0.66 mg/dL (ref 0.44–1.00)
GFR calc Af Amer: >60 mL/min (ref >60)
GFR calc non Af Amer: >60 mL/min (ref >60)
Glucose, Bld: 106 mg/dL — ABNORMAL HIGH (ref 70–99)
Potassium: 3.3 mmol/L — ABNORMAL LOW (ref 3.5–5.1)
Sodium: 137 mmol/L (ref 135–145)
Total Bilirubin: 0.6 mg/dL (ref 0.3–1.2)
Total Protein: 6.3 g/dL — ABNORMAL LOW (ref 6.5–8.1)

## 2019-04-04 LAB — CBC
HCT: 30.7 % — ABNORMAL LOW (ref 36.0–46.0)
Hemoglobin: 9.5 g/dL — ABNORMAL LOW (ref 12.0–15.0)
MCH: 26.8 pg (ref 26.0–34.0)
MCHC: 30.9 g/dL (ref 30.0–36.0)
MCV: 86.7 fL (ref 80.0–100.0)
Platelets: 383 10*3/uL (ref 150–400)
RBC: 3.54 MIL/uL — ABNORMAL LOW (ref 3.87–5.11)
RDW: 17 % — ABNORMAL HIGH (ref 11.5–15.5)
WBC: 6.2 10*3/uL (ref 4.0–10.5)
nRBC: 0 % (ref 0.0–0.2)

## 2019-04-04 LAB — GLUCOSE, CAPILLARY
Glucose-Capillary: 119 mg/dL — ABNORMAL HIGH (ref 70–99)
Glucose-Capillary: 147 mg/dL — ABNORMAL HIGH (ref 70–99)
Glucose-Capillary: 112 mg/dL — ABNORMAL HIGH (ref 70–99)
Glucose-Capillary: 179 mg/dL — ABNORMAL HIGH (ref 70–99)

## 2019-04-04 LAB — ECHOCARDIOGRAM COMPLETE
Height: 64 in
Weight: 1675.5 oz

## 2019-04-04 LAB — SURGICAL PATHOLOGY

## 2019-04-04 MED ORDER — FENTANYL CITRATE (PF) 100 MCG/2ML IJ SOLN
25.0000 ug | Freq: Once | INTRAMUSCULAR | Status: AC
  Administered 2019-04-04: 25 ug via INTRAVENOUS
  Filled 2019-04-04: qty 2

## 2019-04-04 MED ORDER — POTASSIUM CHLORIDE 10 MEQ/100ML IV SOLN
10.0000 meq | INTRAVENOUS | Status: AC
  Administered 2019-04-04 (×4): 10 meq via INTRAVENOUS
  Filled 2019-04-04 (×3): qty 100

## 2019-04-04 MED ORDER — HYDROMORPHONE HCL 2 MG/ML IJ SOLN
0.5000 mg | Freq: Once | INTRAMUSCULAR | Status: AC
  Administered 2019-04-04: 0.5 mg via INTRAVENOUS
  Filled 2019-04-04: qty 1

## 2019-04-04 MED ORDER — SODIUM CHLORIDE 0.9 % IV BOLUS
1000.0000 mL | Freq: Once | INTRAVENOUS | Status: AC
  Administered 2019-04-04: 1000 mL via INTRAVENOUS

## 2019-04-04 MED ORDER — OSMOLITE 1.2 CAL PO LIQD: 1000.0000 mL | ORAL | Status: DC

## 2019-04-04 MED ORDER — SODIUM CHLORIDE 0.9 % IV BOLUS
500.0000 mL | Freq: Once | INTRAVENOUS | Status: AC
  Administered 2019-04-04: 500 mL via INTRAVENOUS

## 2019-04-04 NOTE — Progress Notes (Signed)
[facility] Progress Note  [name] 29 y.o. Jun 15, 1990   Subjective: No N/V. Abdominal pain a little better since pain meds. Nurse in room. Nasoenteric feeding tube noted.  Objective: Vital signs: Vitals:   04/04/19 0409 04/04/19 0410  BP: (!) 132/94 (!) 125/92  Pulse: (!) 111 (!) 110  Resp: 16   Temp: 99.2 F (37.3 C)   SpO2: 99% 99%    Physical Exam: Gen: lethargic, thin, no acute distress  HEENT: anicteric sclera CV: RRR Chest: CTA B Abd: diffuse tenderness with guarding, flat, nondistended, +BS Ext: no edema  Lab Results: Recent Labs    04/03/19 0600 04/04/19 0454  NA 133* 137  K 3.6 3.3*  CL 102 108  CO2 19* 20*  GLUCOSE 280* 106*  BUN 7 7  CREATININE 0.64 0.66  CALCIUM 8.4* 8.4*   Recent Labs    04/04/19 0454  AST 10*  ALT 9  ALKPHOS 64  BILITOT 0.6  PROT 6.3*  ALBUMIN 3.4*   Recent Labs    04/03/19 0600 04/04/19 0454  WBC 6.3 6.2  HGB 9.7* 9.5*  HCT 31.3* 30.7*  MCV 87.7 86.7  PLT 359 383      Assessment/Plan: Diabetic gastroparesis with malnutrition - needs nasoenteric feeding tube advanced into the jejunum by IR and then start TFs per nutrition. Supportive care. Continue E-mycin and Reglan for now. Avoid narcotics pain meds. No new GI recs. Will follow from a distance. Call us back if questions.   [name] 04/04/2019, 12:00 PM  Questions please call [phone] ID: [name], female   DOB: 1990/07/18, 29 y.o.   MRN: [id number]

## 2019-04-04 NOTE — Progress Notes (Addendum)
PROGRESS NOTE    [name]  [id number] DOB: 05-10-1990 DOA: 03/16/2019 PCP: Vicenta Aly, FNP   Brief Narrative: 29 year old with past medical significant for type 1 diabetes on insulin pump, GERD, asthma, diabetes gastroparesis, intractable cyclical vomiting, hypertension, history of previous DKA and pancreatitis who presents secondary to nausea vomiting with concern of gastroparesis flare.  Patient currently receiving antiemetics, IV fluids and IV analgesic.  Plan was to proceed with opioid weaning to prevent worsening of diabetes gastroparesis. Nasal Jejuna tube placement.     Assessment & Plan:   Active Problems:   Diabetic gastroparesis (HCC)   Intractable nausea and vomiting   Diabetes mellitus ([place])  1-Intractable nausea and vomiting: To be related to gastroparesis.  Prior history of cholecystectomy. Several admissions and ED visits for similar symptoms. Weaning narcotics. Plan to stop dilaudid.  Endoscopy showed retain food stomach, no anatomical cause for nausea, vomiting.  Continue with Reglan.  Started on Erythromycin 3-13.  Plan for Jejunal tube placement. Plan to start tube feeding when in place.  Fluoro Jejunal tube placement: The feeding tube was advanced to the level of the antro pyloric junction. Despite multiple attempts the tip of the feeding tube would not advance beyond the antro pyloric junction. Will discussed with Dr Michail Sermon next step in management.  Discussed with Schooler; plan to hold on starting tube feeding. He will order GI series for tomorrow.   2-Abnormal EKG, Tachycardia, ST elevation; no chest pain  EKG read as pericarditis, Patient denies chest pain.  ECHO; no pericardial effusion, evidence of low volume. Will give IV fluids.  Troponin negative.  Discussed with cardiology, if EKG is still abnormal patient will require follow up with cardiology outpatient. Will repeat EKG  Abdominal pain: Secondary to above, #1.  Also concern  for opioid induced pain.  Plan to taper off of narcotics.  3-Diabetes type 1: With hypoglycemia Insulin pump off. Continue with meal coverage Continue with  lantus to 12 units.   Hypokalemia: Replete with IV kcl.   Anemia, iron deficiency: Continue with iron supplements  Constipation: Continue with MiraLAX Senokot and suppository KUB with moderate colonic stool burden on 3/7 Had BM.   Neurogenic bladder: She performed self catheterization. Bladder scan. Patient report she has been urinating.   Pyuria;  Urine culture no growth to date,.   Blood culture positive for staphylococcus Coagulase negative. From port catheter.  Repeated blood culture; No growth to date.  Discussed with ID, plan to treat with IV vancomycin for 3-- 5 five days.  Day 5./5   Estimated body mass index is 17.97 kg/m as calculated from the following:   Height as of this encounter: 5\' 4"  (1.626 m).   Weight as of this encounter: 47.5 kg.   DVT prophylaxis: On xarelto , patient refuse Lovenox. Not on anticoagulation prior to admission Code Status: Full code Family Communication: Care discussed with Mother 3-15 Disposition Plan:  Patient is from: Home  Anticipated d/c date: Home when tolerates diet.  Barriers to d/c or necessity for inpatient status: still with persistent nausea and vomiting. Not tolerating oral intake  Consultants:   GI  Procedures:   none  Antimicrobials:    Subjective: Still complaining of abdominal pain.  Asking for dilaudid.   Objective: Vitals:   04/03/19 1859 04/03/19 2300 04/04/19 0409 04/04/19 0410  BP: 117/84 (!) 119/94 (!) 132/94 (!) 125/92  Pulse: 97 (!) 102 (!) 111 (!) 110  Resp: 16 16 16    Temp: 99.5 F (37.5 C) 99 F (  37.2 C) 99.2 F (37.3 C)   TempSrc: Oral Oral Oral   SpO2: 97% 100% 99% 99%  Weight:      Height:        Intake/Output Summary (Last 24 hours) at 04/04/2019 0934 Last data filed at 04/04/2019 0546 Gross per 24 hour  Intake 8172.87 ml    Output 311 ml  Net 7861.87 ml   Filed Weights   03/30/19 0700 03/31/19 0603 04/01/19 0628  Weight: 48 kg 48.4 kg 47.5 kg    Examination:  General exam: NAD, ng tube Respiratory system: CTA Cardiovascular system: S 1, S 2 RRR Gastrointestinal system: BS present, soft  Central nervous system: non focal.  Extremities; no edema    Data Reviewed: I have personally reviewed following labs and imaging studies  CBC: Recent Labs  Lab 03/29/19 0525 03/31/19 0650 04/03/19 0600 04/04/19 0454  WBC 5.3 7.7 6.3 6.2  HGB 9.3* 8.8* 9.7* 9.5*  HCT 30.1* 28.7* 31.3* 30.7*  MCV 86.5 88.3 87.7 86.7  PLT 308 291 359 [id number]   Basic Metabolic Panel: Recent Labs  Lab 03/29/19 0525 03/29/19 0525 03/30/19 0437 03/31/19 0540 03/31/19 0650 03/31/19 0650 04/01/19 0547 04/01/19 1143 04/02/19 0622 04/03/19 0600 04/04/19 0454  NA 136   < > 134*  --  133*  --   --  136  --  133* 137  K 3.3*   < > 4.1  --  3.9  --   --  3.7  --  3.6 3.3*  CL 103   < > 103  --  102  --   --  104  --  102 108  CO2 27   < > 25  --  23  --   --  26  --  19* 20*  GLUCOSE 56*   < > 335*  --  336*  --   --  175*  --  280* 106*  BUN 11   < > 8  --  8  --   --  6  --  7 7  CREATININE 0.63   < > 0.63   < > 0.59   < > 0.46 0.47 0.50 0.64 0.66  CALCIUM 8.1*   < > 8.2*  --  7.9*  --   --  8.2*  --  8.4* 8.4*  MG 1.9  --   --   --   --   --   --   --   --   --   --    < > = values in this interval not displayed.   GFR: Estimated Creatinine Clearance: 77.8 mL/min (by C-G formula based on SCr of 0.66 mg/dL). Liver Function Tests: Recent Labs  Lab 03/31/19 0650 04/04/19 0454  AST 9* 10*  ALT 7 9  ALKPHOS 58 64  BILITOT 0.9 0.6  PROT 5.3* 6.3*  ALBUMIN 2.9* 3.4*   No results for input(s): LIPASE, AMYLASE in the last 168 hours. No results for input(s): AMMONIA in the last 168 hours. Coagulation Profile: No results for input(s): INR, PROTIME in the last 168 hours. Cardiac Enzymes: No results for input(s):  CKTOTAL, CKMB, CKMBINDEX, TROPONINI in the last 168 hours. BNP (last 3 results) No results for input(s): PROBNP in the last 8760 hours. HbA1C: No results for input(s): HGBA1C in the last 72 hours. CBG: Recent Labs  Lab 04/03/19 1222 04/03/19 1655 04/03/19 2007 04/03/19 2055 04/04/19 0744  GLUCAP 165* 76 37* 167* 119*   Lipid Profile: No  results for input(s): CHOL, HDL, LDLCALC, TRIG, CHOLHDL, LDLDIRECT in the last 72 hours. Thyroid Function Tests: No results for input(s): TSH, T4TOTAL, FREET4, T3FREE, THYROIDAB in the last 72 hours. Anemia Panel: No results for input(s): VITAMINB12, FOLATE, FERRITIN, TIBC, IRON, RETICCTPCT in the last 72 hours. Sepsis Labs: No results for input(s): PROCALCITON, LATICACIDVEN in the last 168 hours.  Recent Results (from the past 240 hour(s))  Culture, blood (routine x 2)     Status: None   Collection Time: 03/29/19  4:18 PM   Specimen: BLOOD  Result Value Ref Range Status   Specimen Description BLOOD LEFT ARM  Final   Special Requests   Final    BOTTLES DRAWN AEROBIC ONLY Blood Culture results may not be optimal due to an inadequate volume of blood received in culture bottles Performed at [facility], [address] [place], [place] [address]    Culture NO GROWTH 5 DAYS  Final   Report Status 04/03/2019 FINAL  Final  Culture, blood (routine x 2)     Status: None   Collection Time: 03/29/19  4:18 PM   Specimen: BLOOD LEFT HAND  Result Value Ref Range Status   Specimen Description BLOOD LEFT HAND  Final   Special Requests   Final    BOTTLES DRAWN AEROBIC ONLY Blood Culture adequate volume Performed at [facility] [address] [place], [place] [address]    Culture NO GROWTH 5 DAYS  Final   Report Status 04/03/2019 FINAL  Final  Urine Culture     Status: None   Collection Time: 03/30/19  5:07 PM   Specimen: Urine, Random  Result Value Ref Range Status   Specimen Description   Final    URINE,  RANDOM Performed at [facility] [address], [place], [place] [address]    Special Requests   Final    NONE Performed at [facility], [place] [address] [place], [place] [address]    Culture   Final    NO GROWTH Performed at [facility], [place] [address], [place],  [address]    Report Status 03/31/2019 FINAL  Final         Radiology Studies: DG Chest Port 1 View  Result Date: 04/03/2019 CLINICAL DATA:  Status post feeding tube placement. EXAM: PORTABLE CHEST 1 VIEW COMPARISON:  December 24, 2018. FINDINGS: The heart size and mediastinal contours are within normal limits. Both lungs are clear. No pneumothorax or pleural effusion is noted. Dobbhoff tube tip is seen in the stomach. Right internal jugular Port-A-Cath is noted with distal tip in expected position of cavoatrial junction. The visualized skeletal structures are unremarkable. IMPRESSION: Dobbhoff tube tip seen in stomach. Right internal jugular Port-A-Cath seen with distal tip in expected position of cavoatrial junction. No acute abnormality seen. Electronically Signed   By: Marijo Conception M.D.   On: 04/03/2019 14:55        Scheduled Meds: . Chlorhexidine Gluconate Cloth  6 each Topical Daily  . insulin aspart  0-5 Units Subcutaneous QHS  . insulin aspart  0-6 Units Subcutaneous TID WC  . insulin aspart  2 Units Subcutaneous TID WC  . insulin glargine  12 Units Subcutaneous Daily  . metoCLOPramide (REGLAN) injection  10 mg Intravenous Q6H  . mirtazapine  15 mg Oral QHS  . pantoprazole (PROTONIX) IV  40 mg Intravenous Q12H  . polyethylene glycol  17 g Oral Daily  . rivaroxaban  10  mg Oral Daily  . senna  1 tablet Oral BID  . sodium chloride flush  10-40 mL Intracatheter Q12H   Continuous Infusions: . sodium chloride Stopped (04/04/19 0450)  . erythromycin 250 mg (04/04/19 0450)  . potassium chloride    . sodium chloride       LOS: 19 days    Time spent: 35  minutes    Arianna Delsanto A Jearld Hemp, MD [facility]   If 7PM-7AM, please contact night-coverage [email or web address]  04/04/2019, 9:34 AM

## 2019-04-04 NOTE — Progress Notes (Signed)
Nutrition Follow-up  DOCUMENTATION CODES:   Severe malnutrition in context of chronic illness  INTERVENTION:  Pending X-ray confirmation on NJT placement, initiate; -Osmolite 1.2 @ 15 ml/hr, advance as tolerated 10 ml every 8 hrs to goal rate 55 ml/hr (1320 ml/day)  Tube feed regimen at goal rate 55 ml/hr (1320 ml/day) will provide 1584 kcal, 73 grams of protein, and 1082 ml free water   NUTRITION DIAGNOSIS:   Severe Malnutrition related to chronic illness(diabetic gastroparesis) as evidenced by energy intake < or equal to 75% for > or equal to 1 month, percent weight loss, moderate fat depletion, severe fat depletion, moderate muscle depletion, severe muscle depletion, edema.   GOAL:   Patient will meet greater than or equal to 90% of their needs    MONITOR:   PO intake, Labs, Weight trends, I & O's, TF tolerance  REASON FOR ASSESSMENT:   Consult Enteral/tube feeding initiation and management  ASSESSMENT:  29 year old female with past medical history significant for T1DM on insulin pump, recurrent DKA, gastroparesis, history of multiple hospitalizations due to intractable cyclic vomiting, HTN, GERD, asthma, presented with worsening nausea and vomiting.  Patient admitted on 2/25 for diabetic gastroparesis and intractable nausea and vomiting.  Patient with persistent nausea and vomiting, not tolerating oral intake.    Per notes: - EGD on 3/12 showed no anatomic cause for n/v; retained food in stomach consistent wit diabetic gastroparesis - opioid weaning to prevent worsening of gastroparesis - stool burden on CAT scan s/p fleet enema, milk of magnesia on 3/12, last BM yesterday -erythromycin as prokinetic -blood culture positive for staphylococcus coagulase negative from port catheter, completed 5/5 IV  Vancomycin on 3/15  3/15 - NJT placed by ICU nurse, X-ray showed feeding tube was not in stomach. Per IR, plans to see if tube advanced itself into stomach over night,  repeat x-ray this morning pending. Will initiate trickle feeds with confirmation.  Patient awake, curled onto left side and shaking this morning at RD visit. Unable to obtain nutrition history, patient provided limited verbal responses. She nodded her head yes when asked if she continued to feel nauseas and endorsed episode of vomiting, noted the emesis basin on bedside tray, lined with papertowels contained a small amount of liquid.  Patient responded no when asked if she had anything to eat for dinner last night or breakfast this morning. Per meal history, on 3/6 she ate 75-100% x 2 meals on 3/8 she at 0% of 3 documented meals.   Non-pitting BLE edema noted per RN assessment  UBW 125 lbs per pt report Admit wt 113.52 lbs Current wt 104.5 lbs Per review of weight history, on 11/23 pt wt 54.4 kg (119.68 lbs), on 12/26 pt wt 52.9 kg (116.38 lbs), on 1/29 pt wt 54.4 kg (119.68 lbs). Patient has lost 15.18 lbs over the past 4 months (12.7%) and 9 lbs (7.9%) since admission which is severe for time frame.  Medications reviewed and include: SSI, Lantus, Reglan, Remeron, Protonix, Miralax NaCl Erythromycin  Labs: CBGs 119-167 x 24 hrs, K 3.3 (L) Lab Results  Component Value Date   HGBA1C 7.8 (H) 02/18/2019    NUTRITION - FOCUSED PHYSICAL EXAM:    Most Recent Value  Orbital Region  Moderate depletion  Upper Arm Region  Moderate depletion  Thoracic and Lumbar Region  Severe depletion  Buccal Region  Severe depletion  Temple Region  Moderate depletion  Clavicle Bone Region  Severe depletion  Clavicle and Acromion Bone Region  Severe depletion  Scapular Bone Region  Unable to assess  Dorsal Hand  Moderate depletion  Patellar Region  Severe depletion  Anterior Thigh Region  Unable to assess  Posterior Calf Region  Moderate depletion  Edema (RD Assessment)  Mild  Hair  Unable to assess [wearing a head wrap]  Eyes  Reviewed  Mouth  Reviewed  Skin  Reviewed  Nails  Reviewed        Diet Order:   Diet Order            DIET SOFT Room service appropriate? Yes; Fluid consistency: Thin  Diet effective now              EDUCATION NEEDS:   Not appropriate for education at this time  Skin:  Skin Assessment: Reviewed RN Assessment  Last BM:  3/15  Height:   Ht Readings from Last 1 Encounters:  03/16/19 5\' 4"  (1.626 m)    Weight:   Wt Readings from Last 1 Encounters:  04/01/19 47.5 kg    Ideal Body Weight:  54.5 kg  BMI:  Body mass index is 17.97 kg/m.  Estimated Nutritional Needs:   Kcal:  1500-1700  Protein:  70-80  Fluid:  >/= 1.5 L/day   Lajuan Lines, RD, LDN Clinical Nutrition After Hours/Weekend Pager # in [place]

## 2019-04-04 NOTE — Progress Notes (Signed)
  Echocardiogram 2D Echocardiogram has been performed.  [name] [name] 04/04/2019, 9:03 AM

## 2019-04-04 NOTE — Progress Notes (Signed)
Hypoglycemic Event  CBG: 37  Treatment:   dextrose 50 % solution 25 g : Dose 25 g : Intravenous : STAT      Symptoms: Patient denied symptoms   Follow-up CBG: Time: 2045 CBG Result:167  Possible Reasons for Event: Patient unable to eat - type one diabetic  Comments/MD notified: Followed standing order protocol     Tennis Ship "Dorris Fetch" RN BSN

## 2019-04-05 ENCOUNTER — Inpatient Hospital Stay (HOSPITAL_COMMUNITY): Payer: BC Managed Care – PPO

## 2019-04-05 LAB — GLUCOSE, CAPILLARY
Glucose-Capillary: 313 mg/dL — ABNORMAL HIGH (ref 70–99)
Glucose-Capillary: 179 mg/dL — ABNORMAL HIGH (ref 70–99)
Glucose-Capillary: 159 mg/dL — ABNORMAL HIGH (ref 70–99)
Glucose-Capillary: 143 mg/dL — ABNORMAL HIGH (ref 70–99)

## 2019-04-05 LAB — CBC
HCT: 28.1 % — ABNORMAL LOW (ref 36.0–46.0)
Hemoglobin: 8.8 g/dL — ABNORMAL LOW (ref 12.0–15.0)
MCH: 27.5 pg (ref 26.0–34.0)
MCHC: 31.3 g/dL (ref 30.0–36.0)
MCV: 87.8 fL (ref 80.0–100.0)
Platelets: 326 10*3/uL (ref 150–400)
RBC: 3.2 MIL/uL — ABNORMAL LOW (ref 3.87–5.11)
RDW: 16.7 % — ABNORMAL HIGH (ref 11.5–15.5)
WBC: 5.1 10*3/uL (ref 4.0–10.5)
nRBC: 0 % (ref 0.0–0.2)

## 2019-04-05 LAB — BASIC METABOLIC PANEL
Anion gap: 5 (ref 5–15)
BUN: 7 mg/dL (ref 6–20)
CO2: 21 mmol/L — ABNORMAL LOW (ref 22–32)
Calcium: 8 mg/dL — ABNORMAL LOW (ref 8.9–10.3)
Chloride: 109 mmol/L (ref 98–111)
Creatinine, Ser: 0.55 mg/dL (ref 0.44–1.00)
GFR calc Af Amer: >60 mL/min (ref >60)
GFR calc non Af Amer: >60 mL/min (ref >60)
Glucose, Bld: 277 mg/dL — ABNORMAL HIGH (ref 70–99)
Potassium: 3.5 mmol/L (ref 3.5–5.1)
Sodium: 135 mmol/L (ref 135–145)

## 2019-04-05 LAB — MAGNESIUM: Magnesium: 1.4 mg/dL — ABNORMAL LOW (ref 1.7–2.4)

## 2019-04-05 MED ORDER — POTASSIUM CHLORIDE IN NACL 20-0.9 MEQ/L-% IV SOLN
INTRAVENOUS | Status: DC
  Filled 2019-04-05 (×2): qty 1000

## 2019-04-05 MED ORDER — HYDROMORPHONE HCL 1 MG/ML IJ SOLN
0.5000 mg | Freq: Every day | INTRAMUSCULAR | Status: DC | PRN
  Administered 2019-04-05 – 2019-04-07 (×3): 0.5 mg via INTRAVENOUS
  Filled 2019-04-05 (×4): qty 0.5

## 2019-04-05 MED ORDER — MAGNESIUM SULFATE 2 GM/50ML IV SOLN
2.0000 g | Freq: Once | INTRAVENOUS | Status: AC
  Administered 2019-04-05: 2 g via INTRAVENOUS
  Filled 2019-04-05: qty 50

## 2019-04-05 NOTE — Progress Notes (Signed)
Patient messing with tape with panda tube. Educated patient not to mess with her tube or tape

## 2019-04-05 NOTE — Progress Notes (Signed)
PROGRESS NOTE  [name]  [id number] DOB: 04-Nov-1990 DOA: 03/16/2019 PCP: Vicenta Aly, FNP   Brief Narrative: [name] is a 29 y.o. female with a history of T1DM on insulin pump, diabetic gastroparesis, pancreatitis, cyclic vomiting syndrome, HTN, asthma, and GERD who presented with intractable nausea and vomiting with abdominal pain which has failed to improve with supportive treatments including IV fluids, antiemetics, and tapering narcotic pain medications. NJT placement was attempted but unsuccessful on multiple attempts.  Assessment & Plan: Active Problems:   Diabetic gastroparesis (HCC)   Intractable nausea and vomiting   Diabetes mellitus (HCC)  Gastroparesis, intractable nausea and vomiting: Recurrent admissions for same. Endoscopy showed retain food stomach, no anatomical cause for nausea, vomiting.  - Minimize narcotics - Continue with Reglan. Started erythromycin IV 3/13.  - GI consulted but signed off 3/16. NGT placed 3/16 and subsequent upper GI series showed no cause for ongoing symptoms. Will trial diet.   Abnormal ECG, Tachycardia, ST elevation: No chest pain, no pericardial effusion on echocardiogram to suggest pericarditis or WMA.   - Follow up with cardiology as an outpatient.  Abdominal pain: Secondary to above, #1.  Also concern for opioid induced pain.  - Continue toradol in effort to minimize narcotics. Ok to give low dose dilaudid once daily if necessary. Discussed rationale for stopping this with patient today.   Severe protein calorie malnutrition:  - Attempted to introduce NJT but unsuccessful to get postpyloric positioning in IR.  - Nausea improving. Will introduce full liquid diet with continue antiemetics. If fails, would need to consider replacement of tube vs. TPN. - Monitor phos and electrolytes to check for refeeding syndrome.  T1DM: With hypoglycemia.  - DC insulin pump, now on basal-bolus insulin which we'll continue.    Hypokalemia: Due to poor intake.  - Add to IVF  Hypomagnesemia:  - Continue checking and supplement today by IV.   Iron deficiency anemia: Exacerbated by ongoing malnutrition.  - Continue iron supplementation.  Constipation: Ongoing issue.  - Continue bowel regimen which patient is intermittently declining.   Neurogenic bladder: She performed self catheterization. - Voiding spontaneously.  Pyuria: Urine culture negative   CoNS bacteremia: Cx from port with repeat NGTD s/p treatment with vancomycin x5 days.   DVT prophylaxis: Xarelto (pt declined lovenox) Code Status: Full Family Communication: None Disposition Plan: Continues to require IV fluids and medications. Hopeful to return to home environment once intractable vomiting improved.   Consultants:   GI  Procedures:  EGD 3/12: Impression:       - LA Grade A erosive esophagitis with no bleeding.                           - A large amount of food (residue) in the stomach.                           - Normal examined duodenum. Biopsied.                           - Biopsies were taken with a cold forceps for                            histology in the gastric antrum.                           -  The overall picture is consistent with diabetic                            gastroparesis. No anatomic cause for nausea and                             vomiting seen.  Antimicrobials:  Erythromycin  Vancomycin   Subjective: No nausea x2 days, wants to start diet. Abdominal pain remains severe, constant, diffuse, better with dilaudid, not better with fentanyl, refusing po hydrocodone.   Objective: Vitals:   04/04/19 0410 04/04/19 1512 04/04/19 2025 04/05/19 0521  BP: (!) 125/92 (!) 133/96 117/75 (!) 136/92  Pulse: (!) 110 94 88 81  Resp:  19 18   Temp:  98.4 F (36.9 C) 99.3 F (37.4 C) 98.6 F (37 C)  TempSrc:  Oral Oral Oral  SpO2: 99% 100% 99% 99%  Weight:      Height:        Intake/Output Summary (Last  24 hours) at 04/05/2019 1446 Last data filed at 04/05/2019 0944 Gross per 24 hour  Intake 10 ml  Output --  Net 10 ml   Filed Weights   03/30/19 0700 03/31/19 0603 04/01/19 0628  Weight: 48 kg 48.4 kg 47.5 kg    Gen: 29 y.o. female in no distress HEENT: Right nare tube Pulm: Non-labored breathing room air. Clear to auscultation bilaterally.  CV: Regular rate and rhythm. No murmur, rub, or gallop. No JVD, no pedal edema. GI: Abdomen soft, minimally diffusely tender, non-distended, with normoactive bowel sounds. No organomegaly or masses felt. Ext: Warm, no deformities Skin: No rashes, lesions or ulcers Neuro: Alert and oriented. No focal neurological deficits. Psych: Judgement and insight appear normal. Mood & affect appropriate.   Data Reviewed: I have personally reviewed following labs and imaging studies  CBC: Recent Labs  Lab 03/31/19 0650 04/03/19 0600 04/04/19 0454 04/05/19 0520  WBC 7.7 6.3 6.2 5.1  HGB 8.8* 9.7* 9.5* 8.8*  HCT 28.7* 31.3* 30.7* 28.1*  MCV 88.3 87.7 86.7 87.8  PLT 291 359 383 [id number]   Basic Metabolic Panel: Recent Labs  Lab 03/31/19 0650 04/01/19 0547 04/01/19 1143 04/02/19 0622 04/03/19 0600 04/04/19 0454 04/05/19 0520  NA 133*  --  136  --  133* 137 135  K 3.9  --  3.7  --  3.6 3.3* 3.5  CL 102  --  104  --  102 108 109  CO2 23  --  26  --  19* 20* 21*  GLUCOSE 336*  --  175*  --  280* 106* 277*  BUN 8  --  6  --  7 7 7   CREATININE 0.59   < > 0.47 0.50 0.64 0.66 0.55  CALCIUM 7.9*  --  8.2*  --  8.4* 8.4* 8.0*  MG  --   --   --   --   --   --  1.4*   < > = values in this interval not displayed.   GFR: Estimated Creatinine Clearance: 77.8 mL/min (by C-G formula based on SCr of 0.55 mg/dL). Liver Function Tests: Recent Labs  Lab 03/31/19 0650 04/04/19 0454  AST 9* 10*  ALT 7 9  ALKPHOS 58 64  BILITOT 0.9 0.6  PROT 5.3* 6.3*  ALBUMIN 2.9* 3.4*   No results for input(s): LIPASE, AMYLASE in the last 168 hours. No results for  input(s):  AMMONIA in the last 168 hours. Coagulation Profile: No results for input(s): INR, PROTIME in the last 168 hours. Cardiac Enzymes: No results for input(s): CKTOTAL, CKMB, CKMBINDEX, TROPONINI in the last 168 hours. BNP (last 3 results) No results for input(s): PROBNP in the last 8760 hours. HbA1C: No results for input(s): HGBA1C in the last 72 hours. CBG: Recent Labs  Lab 04/04/19 1211 04/04/19 1750 04/04/19 2023 04/05/19 0730 04/05/19 1118  GLUCAP 147* 112* 179* 313* 179*   Lipid Profile: No results for input(s): CHOL, HDL, LDLCALC, TRIG, CHOLHDL, LDLDIRECT in the last 72 hours. Thyroid Function Tests: No results for input(s): TSH, T4TOTAL, FREET4, T3FREE, THYROIDAB in the last 72 hours. Anemia Panel: No results for input(s): VITAMINB12, FOLATE, FERRITIN, TIBC, IRON, RETICCTPCT in the last 72 hours. Urine analysis:    Component Value Date/Time   COLORURINE YELLOW 03/29/2019 1602   APPEARANCEUR HAZY (A) 03/29/2019 1602   LABSPEC 1.018 03/29/2019 1602   PHURINE 6.0 03/29/2019 1602   GLUCOSEU >=500 (A) 03/29/2019 1602   GLUCOSEU >=1000 11/07/2012 1205   HGBUR LARGE (A) 03/29/2019 1602   BILIRUBINUR NEGATIVE 03/29/2019 1602   KETONESUR 80 (A) 03/29/2019 1602   PROTEINUR 30 (A) 03/29/2019 1602   UROBILINOGEN 0.2 11/24/2014 1045   NITRITE NEGATIVE 03/29/2019 1602   LEUKOCYTESUR LARGE (A) 03/29/2019 1602   Recent Results (from the past 240 hour(s))  Culture, blood (routine x 2)     Status: None   Collection Time: 03/29/19  4:18 PM   Specimen: BLOOD  Result Value Ref Range Status   Specimen Description BLOOD LEFT ARM  Final   Special Requests   Final    BOTTLES DRAWN AEROBIC ONLY Blood Culture results may not be optimal due to an inadequate volume of blood received in culture bottles Performed at [facility], [place] [address], [place], [place] [address]    Culture NO GROWTH 5 DAYS  Final   Report Status 04/03/2019 FINAL  Final  Culture, blood  (routine x 2)     Status: None   Collection Time: 03/29/19  4:18 PM   Specimen: BLOOD LEFT HAND  Result Value Ref Range Status   Specimen Description BLOOD LEFT HAND  Final   Special Requests   Final    BOTTLES DRAWN AEROBIC ONLY Blood Culture adequate volume Performed at [facility] [address], [place], [place] [address]    Culture NO GROWTH 5 DAYS  Final   Report Status 04/03/2019 FINAL  Final  Urine Culture     Status: None   Collection Time: 03/30/19  5:07 PM   Specimen: Urine, Random  Result Value Ref Range Status   Specimen Description   Final    URINE, RANDOM Performed at [facility] [address] [place], [place] [address]    Special Requests   Final    NONE Performed at [facility], [place] [address], [place], [place] [address]    Culture   Final    NO GROWTH Performed at [facility], [place] [address], [place],  [address]    Report Status 03/31/2019 FINAL  Final      Radiology Studies: DG Abd Portable 1V  Result Date: 04/04/2019 CLINICAL DATA:  Tube placement EXAM: PORTABLE ABDOMEN - 1 VIEW COMPARISON:  04/03/2019, 03/26/2019 FINDINGS: Weighted enteric feeding tube is positioned with tip over the gastric body, not significantly changed. Metallic stylette remains in position. Severely overpenetrated abdominal radiograph with limited detail of the  bowel gas pattern. IMPRESSION: Weighted enteric feeding tube is positioned with tip over the gastric body, not significantly changed. Metallic stylette remains in position. Electronically Signed   By: Eddie Candle M.D.   On: 04/04/2019 09:35   DG Loyce Dys Tube Plc W/Fl W/Rad  Result Date: 04/04/2019 CLINICAL DATA:  Request for advancement of feeding tube EXAM: NASO G TUBE PLACEMENT WITH FL AND WITH RAD CONTRAST:  10 cc Gastrografin FLUOROSCOPY TIME:  Fluoroscopy Time:  5 minutes 8 second Radiation Exposure Index (if provided by the fluoroscopic device):  33.7 mGy Number of Acquired Spot Images: 0 COMPARISON:  None. FINDINGS: The existing, indwelling feeding tube tip was in the proximal esophagus. Under intermittent fluoroscopic guidance the feeding tube was advanced into the stomach into the level of the antro pyloric junction. Multiple attempts to further advance the tube beyond the pylorus were unsuccessful. A total of 5 minutes and 8 seconds of fluoro time was employed. IMPRESSION: 1. The feeding tube was advanced to the level of the antro pyloric junction. Despite multiple attempts the tip of the feeding tube would not advance beyond the antro pyloric junction Electronically Signed   By: Kerby Moors M.D.   On: 04/04/2019 15:19   DG INTRO LONG GI TUBE  Result Date: 04/05/2019 CLINICAL DATA:  Nausea and vomiting. EXAM: UPPER GI SERIES WITH KUB TECHNIQUE: After obtaining a scout radiograph a routine upper GI series was performed using thin barium. FLUOROSCOPY TIME:  Fluoroscopy Time:  2 minutes 6 seconds Radiation Exposure Index (if provided by the fluoroscopic device): 14.5 mGy Number of Acquired Spot Images: 7 COMPARISON:  None. FINDINGS: Scout view of the abdomen shows retained oral contrast in the colon. Surgical clips in the right upper quadrant. Patient drank barium from a cup. Normal esophageal motility. No esophageal fold thickening, stricture or obstruction. There is suboptimal distension of the stomach as the patient could not drink more than approximately 1/2 to 2/3 of a cup of contrast. Stomach is grossly unremarkable. Duodenum was not visualized. During the exam, it was noted that the feeding tube tip was in the upper esophagus. The wire was in place and therefore the tube was advanced under fluoroscopy into the stomach, to the pylorus. IMPRESSION: 1. No findings to explain the patient's nausea and vomiting. Assessment of the duodenal bulb is difficult due to poor opacification. 2. Feeding tube was advanced under fluoroscopy into the stomach,  terminating at the pylorus. Electronically Signed   By: Lorin Picket M.D.   On: 04/05/2019 14:00   DG UGI W SINGLE CM (SOL OR THIN BA)  Result Date: 04/05/2019 CLINICAL DATA:  Nausea and vomiting. EXAM: UPPER GI SERIES WITH KUB TECHNIQUE: After obtaining a scout radiograph a routine upper GI series was performed using thin barium. FLUOROSCOPY TIME:  Fluoroscopy Time:  2 minutes 6 seconds Radiation Exposure Index (if provided by the fluoroscopic device): 14.5 mGy Number of Acquired Spot Images: 7 COMPARISON:  None. FINDINGS: Scout view of the abdomen shows retained oral contrast in the colon. Surgical clips in the right upper quadrant. Patient drank barium from a cup. Normal esophageal motility. No esophageal fold thickening, stricture or obstruction. There is suboptimal distension of the stomach as the patient could not drink more than approximately 1/2 to 2/3 of a cup of contrast. Stomach is grossly unremarkable. Duodenum was not visualized. During the exam, it was noted that the feeding tube tip was in the upper esophagus. The wire was in place and therefore the tube was advanced  under fluoroscopy into the stomach, to the pylorus. IMPRESSION: 1. No findings to explain the patient's nausea and vomiting. Assessment of the duodenal bulb is difficult due to poor opacification. 2. Feeding tube was advanced under fluoroscopy into the stomach, terminating at the pylorus. Electronically Signed   By: Lorin Picket M.D.   On: 04/05/2019 14:00   ECHOCARDIOGRAM COMPLETE  Result Date: 04/04/2019    ECHOCARDIOGRAM REPORT   Patient Name:   [name] Date of Exam: 04/04/2019 Medical Rec #:  [id number]         Height:       64.0 in Accession #:    [id number]        Weight:       104.7 lb Date of Birth:  Jan 17, 1991         BSA:          1.486 m Patient Age:    28 years          BP:           125/92 mmHg Patient Gender: F                 HR:           101 bpm. Exam Location:  Inpatient Procedure: 2D Echo, Cardiac  Doppler and Color Doppler Indications:    R94.31 Abnormal EKG  History:        Patient has prior history of Echocardiogram examinations, most                 recent 11/08/2018. Signs/Symptoms:Murmur; Risk                 Factors:Hypertension, Diabetes and GERD.  Sonographer:    Jonelle Sidle Dance Referring Phys: [id number] BELKYS A REGALADO IMPRESSIONS  1. Left ventricular ejection fraction, by estimation, is 65 to 70%. The left ventricle has normal function. The left ventricle has no regional wall motion abnormalities. There is mild left ventricular hypertrophy. Left ventricular diastolic parameters were normal.  2. Right ventricular systolic function is normal. The right ventricular size is normal.  3. Moderate pleural effusion in the left lateral region.  4. The mitral valve is abnormal. Mild mitral valve regurgitation.  5. The aortic valve is tricuspid. Aortic valve regurgitation is not visualized.  6. The inferior vena cava is small, <1.2 cm and spontaneously collapses, suggesting volume depletion and a low RA pressure <3 mmHg. FINDINGS  Left Ventricle: Left ventricular ejection fraction, by estimation, is 65 to 70%. The left ventricle has normal function. The left ventricle has no regional wall motion abnormalities. The left ventricular internal cavity size was normal in size. There is  mild left ventricular hypertrophy. Left ventricular diastolic parameters were normal. Right Ventricle: The right ventricular size is normal. No increase in right ventricular wall thickness. Right ventricular systolic function is normal. Left Atrium: Left atrial size was normal in size. Right Atrium: Right atrial size was normal in size. Pericardium: There is no evidence of pericardial effusion. Mitral Valve: The mitral valve is abnormal. There is mild thickening of the mitral valve leaflet(s). Mild mitral valve regurgitation. Tricuspid Valve: The tricuspid valve is grossly normal. Tricuspid valve regurgitation is trivial. Aortic Valve:  The aortic valve is tricuspid. Aortic valve regurgitation is not visualized. Pulmonic Valve: The pulmonic valve was grossly normal. Pulmonic valve regurgitation is trivial. Aorta: The aortic root, ascending aorta, aortic arch and descending aorta are all structurally normal, with no evidence of dilitation or obstruction. Venous: The inferior vena cava  is small, <1.2 cm and spontaneously collapses, suggesting volume depletion and a low RA pressure <3 mmHg. IAS/Shunts: No atrial level shunt detected by color flow Doppler. Additional Comments: There is a moderate pleural effusion in the left lateral region.  LEFT VENTRICLE PLAX 2D LVIDd:         3.20 cm  Diastology LVIDs:         2.10 cm  LV e' lateral:   14.30 cm/s LV PW:         0.90 cm  LV E/e' lateral: 6.4 LV IVS:        1.20 cm  LV e' medial:    12.80 cm/s LVOT diam:     1.50 cm  LV E/e' medial:  7.2 LV SV:         43 LV SV Index:   29 LVOT Area:     1.77 cm  RIGHT VENTRICLE             IVC RV Basal diam:  1.90 cm     IVC diam: 1.20 cm RV S prime:     17.20 cm/s TAPSE (M-mode): 2.2 cm LEFT ATRIUM             Index       RIGHT ATRIUM          Index LA diam:        3.10 cm 2.09 cm/m  RA Area:     8.62 cm LA Vol (A2C):   32.3 ml 21.74 ml/m RA Volume:   15.50 ml 10.43 ml/m LA Vol (A4C):   26.6 ml 17.90 ml/m LA Biplane Vol: 30.6 ml 20.59 ml/m  AORTIC VALVE LVOT Vmax:   134.00 cm/s LVOT Vmean:  81.000 cm/s LVOT VTI:    0.241 m  AORTA Ao Root diam: 2.50 cm Ao Asc diam:  2.30 cm MITRAL VALVE MV Area (PHT): 4.15 cm    SHUNTS MV Decel Time: 183 msec    Systemic VTI:  0.24 m MV E velocity: 92.00 cm/s  Systemic Diam: 1.50 cm MV A velocity: 86.60 cm/s MV E/A ratio:  1.06 Lyman Bishop MD Electronically signed by Lyman Bishop MD Signature Date/Time: 04/04/2019/11:05:05 AM    Final     Scheduled Meds: . Chlorhexidine Gluconate Cloth  6 each Topical Daily  . insulin aspart  0-5 Units Subcutaneous QHS  . insulin aspart  0-6 Units Subcutaneous TID WC  . insulin  aspart  2 Units Subcutaneous TID WC  . insulin glargine  12 Units Subcutaneous Daily  . metoCLOPramide (REGLAN) injection  10 mg Intravenous Q6H  . mirtazapine  15 mg Oral QHS  . pantoprazole (PROTONIX) IV  40 mg Intravenous Q12H  . polyethylene glycol  17 g Oral Daily  . rivaroxaban  10 mg Oral Daily  . senna  1 tablet Oral BID  . sodium chloride flush  10-40 mL Intracatheter Q12H   Continuous Infusions: . 0.9 % NaCl with KCl 20 mEq / L 125 mL/hr at 04/05/19 1346  . erythromycin 250 mg (04/05/19 0515)  . magnesium sulfate bolus IVPB 2 g (04/05/19 1350)     LOS: 20 days   Time spent: 35 minutes.  Patrecia Pour, MD [facility] [email or web address] 04/05/2019, 2:46 PM

## 2019-04-06 ENCOUNTER — Inpatient Hospital Stay (HOSPITAL_COMMUNITY): Payer: BC Managed Care – PPO

## 2019-04-06 LAB — MAGNESIUM: Magnesium: 1.6 mg/dL — ABNORMAL LOW (ref 1.7–2.4)

## 2019-04-06 LAB — GLUCOSE, CAPILLARY
Glucose-Capillary: 106 mg/dL — ABNORMAL HIGH (ref 70–99)
Glucose-Capillary: 106 mg/dL — ABNORMAL HIGH (ref 70–99)
Glucose-Capillary: 292 mg/dL — ABNORMAL HIGH (ref 70–99)
Glucose-Capillary: 265 mg/dL — ABNORMAL HIGH (ref 70–99)

## 2019-04-06 LAB — COMPREHENSIVE METABOLIC PANEL
ALT: 7 U/L (ref 0–44)
AST: 9 U/L — ABNORMAL LOW (ref 15–41)
Albumin: 2.8 g/dL — ABNORMAL LOW (ref 3.5–5.0)
Alkaline Phosphatase: 58 U/L (ref 38–126)
Anion gap: 4 — ABNORMAL LOW (ref 5–15)
BUN: 5 mg/dL — ABNORMAL LOW (ref 6–20)
CO2: 23 mmol/L (ref 22–32)
Calcium: 8.2 mg/dL — ABNORMAL LOW (ref 8.9–10.3)
Chloride: 115 mmol/L — ABNORMAL HIGH (ref 98–111)
Creatinine, Ser: 0.42 mg/dL — ABNORMAL LOW (ref 0.44–1.00)
GFR calc Af Amer: >60 mL/min (ref >60)
GFR calc non Af Amer: >60 mL/min (ref >60)
Glucose, Bld: 122 mg/dL — ABNORMAL HIGH (ref 70–99)
Potassium: 3.5 mmol/L (ref 3.5–5.1)
Sodium: 142 mmol/L (ref 135–145)
Total Bilirubin: 0.7 mg/dL (ref 0.3–1.2)
Total Protein: 5.3 g/dL — ABNORMAL LOW (ref 6.5–8.1)

## 2019-04-06 LAB — PHOSPHORUS: Phosphorus: 3.3 mg/dL (ref 2.5–4.6)

## 2019-04-06 MED ORDER — MIRTAZAPINE 15 MG PO TABS
15.0000 mg | ORAL_TABLET | Freq: Every day | ORAL | Status: DC
  Administered 2019-04-06 – 2019-04-16 (×11): 15 mg
  Filled 2019-04-06 (×11): qty 1

## 2019-04-06 MED ORDER — POTASSIUM CHLORIDE IN NACL 40-0.9 MEQ/L-% IV SOLN
INTRAVENOUS | Status: DC
  Administered 2019-04-06: 125 mL/h via INTRAVENOUS
  Filled 2019-04-06 (×3): qty 1000

## 2019-04-06 MED ORDER — MAGNESIUM HYDROXIDE 400 MG/5ML PO SUSP
15.0000 mL | Freq: Every day | ORAL | Status: DC
  Administered 2019-04-07 – 2019-04-09 (×3): 15 mL
  Filled 2019-04-06 (×3): qty 30

## 2019-04-06 MED ORDER — MAGNESIUM HYDROXIDE 400 MG/5ML PO SUSP
15.0000 mL | Freq: Every day | ORAL | Status: DC
  Administered 2019-04-06: 15 mL via ORAL
  Filled 2019-04-06: qty 30

## 2019-04-06 MED ORDER — MAGNESIUM SULFATE 2 GM/50ML IV SOLN
2.0000 g | Freq: Once | INTRAVENOUS | Status: AC
  Administered 2019-04-06: 2 g via INTRAVENOUS
  Filled 2019-04-06: qty 50

## 2019-04-06 MED ORDER — SORBITOL 70 % SOLN: 15.0000 mL | Freq: Every day | Status: DC | PRN

## 2019-04-06 MED ORDER — OSMOLITE 1.2 CAL PO LIQD
1000.0000 mL | ORAL | Status: DC
  Administered 2019-04-06 – 2019-04-15 (×8): 1000 mL

## 2019-04-06 MED ORDER — IOHEXOL 300 MG/ML  SOLN
100.0000 mL | Freq: Once | INTRAMUSCULAR | Status: AC | PRN
  Administered 2019-04-06: 100 mL

## 2019-04-06 MED ORDER — HYDROCODONE-ACETAMINOPHEN 5-325 MG PO TABS
1.0000 | ORAL_TABLET | Freq: Three times a day (TID) | ORAL | Status: DC | PRN
  Administered 2019-04-06 – 2019-04-12 (×12): 1
  Filled 2019-04-06 (×13): qty 1

## 2019-04-06 NOTE — Progress Notes (Signed)
PROGRESS NOTE  [name]  [id number] DOB: December 08, 1990 DOA: 03/16/2019 PCP: Vicenta Aly, FNP   Brief Narrative: [name] is a 29 y.o. female with a history of T1DM on insulin pump, diabetic gastroparesis, pancreatitis, cyclic vomiting syndrome, HTN, asthma, and GERD who presented with intractable nausea and vomiting with abdominal pain which has failed to improve with supportive treatments including IV fluids, antiemetics, and tapering narcotic pain medications. NJT placement was attempted but unsuccessful on multiple attempts.  Assessment & Plan: Active Problems:   Diabetic gastroparesis (HCC)   Intractable nausea and vomiting   Diabetes mellitus (HCC)  Gastroparesis, intractable nausea and vomiting: Recurrent admissions for same. Endoscopy showed retain food stomach, no anatomical cause for nausea, vomiting.  - Minimize narcotics - Continue with Reglan. Started erythromycin IV 3/13.  - GI consulted but signed off 3/16. NGT placed 3/16 and subsequent upper GI series showed no cause for ongoing symptoms. Will trial diet.  - D/w IR, will reattempt postpyloric tube placement. 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  Abnormal ECG, Tachycardia, ST elevation: No chest pain, no pericardial effusion on echocardiogram to suggest pericarditis or WMA.   - Follow up with cardiology as an outpatient.  Abdominal pain: Secondary to above, #1.  Also concern for opioid induced pain.  - Continue toradol in effort to minimize narcotics. Ok to give low dose dilaudid once daily if necessary. Discussed rationale for stopping this with patient today.   Severe protein calorie malnutrition:  - Tube feeds to start 3/18. - Nausea improving, pain remains - Monitor phos and electrolytes to check  for refeeding syndrome.  T1DM: With hypoglycemia.  - DC insulin pump, now on basal-bolus insulin which we'll continue.   Hypokalemia: Due to poor intake.  - Add to IVF, increase  Hypomagnesemia:  - Continue checking and supplement again today by IV.   Iron deficiency anemia: Exacerbated by ongoing malnutrition.  - Continue iron supplementation.  Constipation: Ongoing issue.  - Continue bowel regimen which patient is intermittently declining.   Neurogenic bladder: She performed self catheterization. - Voiding spontaneously.  Pyuria: Urine culture negative   CoNS bacteremia: Cx from port with repeat NGTD s/p treatment with vancomycin x5 days.   DVT prophylaxis: Xarelto (pt declined lovenox) Code Status: Full Family Communication: None Disposition Plan: Continues to require IV fluids, tube feeding and medications. Hopeful to return to home environment once intractable vomiting improved but unknown when that will be. Letter written for work, custodian at [employer].   Consultants:   GI  IR  Procedures:  EGD 3/12: Impression:       - LA Grade A erosive esophagitis with no bleeding.                           - A large amount of food (residue) in the stomach.                           - Normal examined duodenum. Biopsied.                           - Biopsies were taken with a cold forceps for  histology in the gastric antrum.                           - The overall picture is consistent with diabetic                            gastroparesis. No anatomic cause for nausea and                             vomiting seen.  Antimicrobials:  Erythromycin  Vancomycin   Subjective: Wants to try to advance diet but also wants increased pain medications, specifically IV dilaudid. Nausea has improved, pain remains severe.   Objective: Vitals:   04/04/19 2025 04/05/19 0521 04/05/19 2233 04/06/19 0651  BP: 117/75 (!) 136/92 105/79 108/74    Pulse: 88 81 77 80  Resp: 18  16 15   Temp:  98.6 F (37 C) 98.1 F (36.7 C) 98.4 F (36.9 C)  TempSrc: Oral Oral Oral Oral  SpO2: 99% 99% 99% 96%  Weight:      Height:        Intake/Output Summary (Last 24 hours) at 04/06/2019 1125 Last data filed at 04/06/2019 0300 Gross per 24 hour  Intake 686.45 ml  Output --  Net 686.45 ml   Filed Weights   03/30/19 0700 03/31/19 0603 04/01/19 0628  Weight: 48 kg 48.4 kg 47.5 kg   Gen: 29 y.o. female in no distress HEENT: Tube in right nare Pulm: Nonlabored breathing room air. Clear. CV: Regular rate and rhythm. No murmur, rub, or gallop. No JVD, no dependent edema. GI: Abdomen soft, diffusely tender, non-distended, with hypoactive bowel sounds.  Ext: Warm, no deformities Skin: No rashes, lesions or ulcers on visualized skin. Neuro: Alert and oriented. No focal neurological deficits. Psych: Judgement and insight appear fair. Mood euthymic & affect congruent. Behavior is appropriate.    Data Reviewed: I have personally reviewed following labs and imaging studies  CBC: Recent Labs  Lab 03/31/19 0650 04/03/19 0600 04/04/19 0454 04/05/19 0520  WBC 7.7 6.3 6.2 5.1  HGB 8.8* 9.7* 9.5* 8.8*  HCT 28.7* 31.3* 30.7* 28.1*  MCV 88.3 87.7 86.7 87.8  PLT 291 359 383 [id number]   Basic Metabolic Panel: Recent Labs  Lab 04/01/19 1143 04/01/19 1143 04/02/19 0622 04/03/19 0600 04/04/19 0454 04/05/19 0520 04/06/19 0450  NA 136  --   --  133* 137 135 142  K 3.7  --   --  3.6 3.3* 3.5 3.5  CL 104  --   --  102 108 109 115*  CO2 26  --   --  19* 20* 21* 23  GLUCOSE 175*  --   --  280* 106* 277* 122*  BUN 6  --   --  7 7 7  5*  CREATININE 0.47   < > 0.50 0.64 0.66 0.55 0.42*  CALCIUM 8.2*  --   --  8.4* 8.4* 8.0* 8.2*  MG  --   --   --   --   --  1.4* 1.6*  PHOS  --   --   --   --   --   --  3.3   < > = values in this interval not displayed.   GFR: Estimated Creatinine Clearance: 77.8 mL/min (A) (by C-G formula based on SCr of 0.42 mg/dL  (L)). Liver Function Tests: Recent Labs  Lab 03/31/19 0650 04/04/19 0454 04/06/19 0450  AST 9* 10* 9*  ALT 7 9 7   ALKPHOS 58 64 58  BILITOT 0.9 0.6 0.7  PROT 5.3* 6.3* 5.3*  ALBUMIN 2.9* 3.4* 2.8*   No results for input(s): LIPASE, AMYLASE in the last 168 hours. No results for input(s): AMMONIA in the last 168 hours. Coagulation Profile: No results for input(s): INR, PROTIME in the last 168 hours. Cardiac Enzymes: No results for input(s): CKTOTAL, CKMB, CKMBINDEX, TROPONINI in the last 168 hours. BNP (last 3 results) No results for input(s): PROBNP in the last 8760 hours. HbA1C: No results for input(s): HGBA1C in the last 72 hours. CBG: Recent Labs  Lab 04/05/19 1118 04/05/19 1727 04/05/19 2231 04/06/19 0733 04/06/19 1107  GLUCAP 179* 159* 143* 106* 106*   Lipid Profile: No results for input(s): CHOL, HDL, LDLCALC, TRIG, CHOLHDL, LDLDIRECT in the last 72 hours. Thyroid Function Tests: No results for input(s): TSH, T4TOTAL, FREET4, T3FREE, THYROIDAB in the last 72 hours. Anemia Panel: No results for input(s): VITAMINB12, FOLATE, FERRITIN, TIBC, IRON, RETICCTPCT in the last 72 hours. Urine analysis:    Component Value Date/Time   COLORURINE YELLOW 03/29/2019 1602   APPEARANCEUR HAZY (A) 03/29/2019 1602   LABSPEC 1.018 03/29/2019 1602   PHURINE 6.0 03/29/2019 1602   GLUCOSEU >=500 (A) 03/29/2019 1602   GLUCOSEU >=1000 11/07/2012 1205   HGBUR LARGE (A) 03/29/2019 1602   BILIRUBINUR NEGATIVE 03/29/2019 1602   KETONESUR 80 (A) 03/29/2019 1602   PROTEINUR 30 (A) 03/29/2019 1602   UROBILINOGEN 0.2 11/24/2014 1045   NITRITE NEGATIVE 03/29/2019 1602   LEUKOCYTESUR LARGE (A) 03/29/2019 1602   Recent Results (from the past 240 hour(s))  Culture, blood (routine x 2)     Status: None   Collection Time: 03/29/19  4:18 PM   Specimen: BLOOD  Result Value Ref Range Status   Specimen Description BLOOD LEFT ARM  Final   Special Requests   Final    BOTTLES DRAWN AEROBIC  ONLY Blood Culture results may not be optimal due to an inadequate volume of blood received in culture bottles Performed at [facility], [place] [address], [place], [place] [address]    Culture NO GROWTH 5 DAYS  Final   Report Status 04/03/2019 FINAL  Final  Culture, blood (routine x 2)     Status: None   Collection Time: 03/29/19  4:18 PM   Specimen: BLOOD LEFT HAND  Result Value Ref Range Status   Specimen Description BLOOD LEFT HAND  Final   Special Requests   Final    BOTTLES DRAWN AEROBIC ONLY Blood Culture adequate volume Performed at [place] [address] [place], [place] [address]    Culture NO GROWTH 5 DAYS  Final   Report Status 04/03/2019 FINAL  Final  Urine Culture     Status: None   Collection Time: 03/30/19  5:07 PM   Specimen: Urine, Random  Result Value Ref Range Status   Specimen Description   Final    URINE, RANDOM Performed at [place] [address], [place], [place] [address]    Special Requests   Final    NONE Performed at [facility], [place] [address] [place], [place] [address]    Culture   Final    NO GROWTH Performed at [facility], [place] [address] [place], [place] [address]    Report Status 03/31/2019 FINAL  Final      Radiology  Studies: DG Naso G Tube Plc W/Fl W/Rad  Result Date: 04/04/2019 CLINICAL DATA:  Request for advancement of feeding tube EXAM: NASO G TUBE PLACEMENT WITH FL AND WITH RAD CONTRAST:  10 cc Gastrografin FLUOROSCOPY TIME:  Fluoroscopy Time:  5 minutes 8 second Radiation Exposure Index (if provided by the fluoroscopic device): 33.7 mGy Number of Acquired Spot Images: 0 COMPARISON:  None. FINDINGS: The existing, indwelling feeding tube tip was in the proximal esophagus. Under intermittent fluoroscopic guidance the feeding tube was advanced into the stomach into the level of the antro pyloric junction. Multiple attempts to further advance the  tube beyond the pylorus were unsuccessful. A total of 5 minutes and 8 seconds of fluoro time was employed. IMPRESSION: 1. The feeding tube was advanced to the level of the antro pyloric junction. Despite multiple attempts the tip of the feeding tube would not advance beyond the antro pyloric junction Electronically Signed   By: Kerby Moors M.D.   On: 04/04/2019 15:19   DG INTRO LONG GI TUBE  Result Date: 04/05/2019 CLINICAL DATA:  Nausea and vomiting. EXAM: UPPER GI SERIES WITH KUB TECHNIQUE: After obtaining a scout radiograph a routine upper GI series was performed using thin barium. FLUOROSCOPY TIME:  Fluoroscopy Time:  2 minutes 6 seconds Radiation Exposure Index (if provided by the fluoroscopic device): 14.5 mGy Number of Acquired Spot Images: 7 COMPARISON:  None. FINDINGS: Scout view of the abdomen shows retained oral contrast in the colon. Surgical clips in the right upper quadrant. Patient drank barium from a cup. Normal esophageal motility. No esophageal fold thickening, stricture or obstruction. There is suboptimal distension of the stomach as the patient could not drink more than approximately 1/2 to 2/3 of a cup of contrast. Stomach is grossly unremarkable. Duodenum was not visualized. During the exam, it was noted that the feeding tube tip was in the upper esophagus. The wire was in place and therefore the tube was advanced under fluoroscopy into the stomach, to the pylorus. IMPRESSION: 1. No findings to explain the patient's nausea and vomiting. Assessment of the duodenal bulb is difficult due to poor opacification. 2. Feeding tube was advanced under fluoroscopy into the stomach, terminating at the pylorus. Electronically Signed   By: Lorin Picket M.D.   On: 04/05/2019 14:00   DG UGI W SINGLE CM (SOL OR THIN BA)  Result Date: 04/05/2019 CLINICAL DATA:  Nausea and vomiting. EXAM: UPPER GI SERIES WITH KUB TECHNIQUE: After obtaining a scout radiograph a routine upper GI series was performed  using thin barium. FLUOROSCOPY TIME:  Fluoroscopy Time:  2 minutes 6 seconds Radiation Exposure Index (if provided by the fluoroscopic device): 14.5 mGy Number of Acquired Spot Images: 7 COMPARISON:  None. FINDINGS: Scout view of the abdomen shows retained oral contrast in the colon. Surgical clips in the right upper quadrant. Patient drank barium from a cup. Normal esophageal motility. No esophageal fold thickening, stricture or obstruction. There is suboptimal distension of the stomach as the patient could not drink more than approximately 1/2 to 2/3 of a cup of contrast. Stomach is grossly unremarkable. Duodenum was not visualized. During the exam, it was noted that the feeding tube tip was in the upper esophagus. The wire was in place and therefore the tube was advanced under fluoroscopy into the stomach, to the pylorus. IMPRESSION: 1. No findings to explain the patient's nausea and vomiting. Assessment of the duodenal bulb is difficult due to poor opacification. 2. Feeding tube was advanced under fluoroscopy into the  stomach, terminating at the pylorus. Electronically Signed   By: Lorin Picket M.D.   On: 04/05/2019 14:00    Scheduled Meds: . Chlorhexidine Gluconate Cloth  6 each Topical Daily  . insulin aspart  0-5 Units Subcutaneous QHS  . insulin aspart  0-6 Units Subcutaneous TID WC  . insulin aspart  2 Units Subcutaneous TID WC  . insulin glargine  12 Units Subcutaneous Daily  . magnesium hydroxide  15 mL Oral Daily  . metoCLOPramide (REGLAN) injection  10 mg Intravenous Q6H  . mirtazapine  15 mg Oral QHS  . pantoprazole (PROTONIX) IV  40 mg Intravenous Q12H  . rivaroxaban  10 mg Oral Daily  . senna  1 tablet Oral BID  . sodium chloride flush  10-40 mL Intracatheter Q12H   Continuous Infusions: . 0.9 % NaCl with KCl 40 mEq / L    . erythromycin 250 mg (04/06/19 0451)  . magnesium sulfate bolus IVPB       LOS: 21 days   Time spent: 35 minutes.  Patrecia Pour, MD [facility] [email or web address] 04/06/2019, 11:25 AM

## 2019-04-06 NOTE — Progress Notes (Signed)
[facility] Gastroenterology Progress Note  Lateesha Musch 29 y.o. October 19, 1990   Subjective: Complaining of abdominal pain. Denies N/V. Nurse in room.  Objective: Vital signs: Vitals:   04/05/19 2233 04/06/19 0651  BP: 105/79 108/74  Pulse: 77 80  Resp: 16 15  Temp: 98.1 F (36.7 C) 98.4 F (36.9 C)  SpO2: 99% 96%    Physical Exam: Gen: lethargic, thin, no acute distress  HEENT: anicteric sclera CV: RRR Chest: CTA B Abd: diffuse tenderness with guarding, soft, nondistended, +BS Ext: no edema  Lab Results: Recent Labs    04/05/19 0520 04/06/19 0450  NA 135 142  K 3.5 3.5  CL 109 115*  CO2 21* 23  GLUCOSE 277* 122*  BUN 7 5*  CREATININE 0.55 0.42*  CALCIUM 8.0* 8.2*  MG 1.4* 1.6*  PHOS  --  3.3   Recent Labs    04/04/19 0454 04/06/19 0450  AST 10* 9*  ALT 9 7  ALKPHOS 64 58  BILITOT 0.6 0.7  PROT 6.3* 5.3*  ALBUMIN 3.4* 2.8*   Recent Labs    04/04/19 0454 04/05/19 0520  WBC 6.2 5.1  HGB 9.5* 8.8*  HCT 30.7* 28.1*  MCV 86.7 87.8  PLT 383 326      Assessment/Plan: Diabetic gastroparesis with malnutrition - needs repeat attempt at advancing nasoenteric feeding tube into jejunum under fluoro by IR and if still not successful hopefully tube will at least be in the duodenum and then will need to start TFs at a trickle to see if she can tolerate and slowly advance per nutrition recs. She cannot go home until her nutrition is better. Continue Reglan and E-mycin. Will sign off. Dr. Paulita Fujita on call this weekend if needed.   [name] 04/06/2019, 9:55 AM  Questions please call [phone] ID: [name], female   DOB: [birth year], 29 y.o.   MRN: [id number]

## 2019-04-07 LAB — COMPREHENSIVE METABOLIC PANEL
ALT: 8 U/L (ref 0–44)
AST: 15 U/L (ref 15–41)
Albumin: 2.9 g/dL — ABNORMAL LOW (ref 3.5–5.0)
Alkaline Phosphatase: 57 U/L (ref 38–126)
Anion gap: 4 — ABNORMAL LOW (ref 5–15)
BUN: <5 mg/dL — ABNORMAL LOW (ref 6–20)
CO2: 24 mmol/L (ref 22–32)
Calcium: 8.2 mg/dL — ABNORMAL LOW (ref 8.9–10.3)
Chloride: 112 mmol/L — ABNORMAL HIGH (ref 98–111)
Creatinine, Ser: 0.4 mg/dL — ABNORMAL LOW (ref 0.44–1.00)
GFR calc Af Amer: >60 mL/min (ref >60)
GFR calc non Af Amer: >60 mL/min (ref >60)
Glucose, Bld: 129 mg/dL — ABNORMAL HIGH (ref 70–99)
Potassium: 4.2 mmol/L (ref 3.5–5.1)
Sodium: 140 mmol/L (ref 135–145)
Total Bilirubin: 0.6 mg/dL (ref 0.3–1.2)
Total Protein: 5.7 g/dL — ABNORMAL LOW (ref 6.5–8.1)

## 2019-04-07 LAB — GLUCOSE, CAPILLARY
Glucose-Capillary: 140 mg/dL — ABNORMAL HIGH (ref 70–99)
Glucose-Capillary: 286 mg/dL — ABNORMAL HIGH (ref 70–99)
Glucose-Capillary: 215 mg/dL — ABNORMAL HIGH (ref 70–99)
Glucose-Capillary: 177 mg/dL — ABNORMAL HIGH (ref 70–99)

## 2019-04-07 LAB — MAGNESIUM: Magnesium: 1.8 mg/dL (ref 1.7–2.4)

## 2019-04-07 LAB — PHOSPHORUS: Phosphorus: 3.4 mg/dL (ref 2.5–4.6)

## 2019-04-07 MED ORDER — HYDROMORPHONE HCL 1 MG/ML IJ SOLN
0.5000 mg | Freq: Two times a day (BID) | INTRAMUSCULAR | Status: DC | PRN
  Administered 2019-04-07 – 2019-04-09 (×4): 0.5 mg via INTRAVENOUS
  Filled 2019-04-07 (×4): qty 0.5

## 2019-04-07 MED ORDER — INSULIN ASPART 100 UNIT/ML ~~LOC~~ SOLN
0.0000 [IU] | SUBCUTANEOUS | Status: DC
  Administered 2019-04-07: 2 [IU] via SUBCUTANEOUS
  Administered 2019-04-07: 5 [IU] via SUBCUTANEOUS
  Administered 2019-04-07 – 2019-04-08 (×2): 3 [IU] via SUBCUTANEOUS
  Administered 2019-04-08: 7 [IU] via SUBCUTANEOUS
  Administered 2019-04-08 (×2): 3 [IU] via SUBCUTANEOUS
  Administered 2019-04-08 (×2): 2 [IU] via SUBCUTANEOUS
  Administered 2019-04-09: 5 [IU] via SUBCUTANEOUS
  Administered 2019-04-09: 3 [IU] via SUBCUTANEOUS
  Administered 2019-04-09: 9 [IU] via SUBCUTANEOUS
  Administered 2019-04-09 (×2): 3 [IU] via SUBCUTANEOUS
  Administered 2019-04-09 – 2019-04-10 (×2): 1 [IU] via SUBCUTANEOUS
  Administered 2019-04-10: 9 [IU] via SUBCUTANEOUS
  Administered 2019-04-10 (×2): 7 [IU] via SUBCUTANEOUS
  Administered 2019-04-10 (×2): 5 [IU] via SUBCUTANEOUS
  Administered 2019-04-11: 2 [IU] via SUBCUTANEOUS
  Administered 2019-04-11: 7 [IU] via SUBCUTANEOUS
  Administered 2019-04-11: 9 [IU] via SUBCUTANEOUS
  Administered 2019-04-12: 2 [IU] via SUBCUTANEOUS
  Administered 2019-04-12 – 2019-04-13 (×3): 9 [IU] via SUBCUTANEOUS
  Administered 2019-04-13: 2 [IU] via SUBCUTANEOUS
  Administered 2019-04-13: 7 [IU] via SUBCUTANEOUS
  Administered 2019-04-13 (×2): 3 [IU] via SUBCUTANEOUS
  Administered 2019-04-14: 7 [IU] via SUBCUTANEOUS
  Administered 2019-04-14: 3 [IU] via SUBCUTANEOUS
  Administered 2019-04-14: 2 [IU] via SUBCUTANEOUS

## 2019-04-07 MED ORDER — POTASSIUM CHLORIDE IN NACL 20-0.45 MEQ/L-% IV SOLN
INTRAVENOUS | Status: DC
  Filled 2019-04-07 (×5): qty 1000

## 2019-04-07 NOTE — Progress Notes (Signed)
PROGRESS NOTE  [name]  [id number] DOB: Feb 24, 1990 DOA: 03/16/2019 PCP: Vicenta Aly, FNP   Brief Narrative: [name] is a 29 y.o. female with a history of T1DM on insulin pump, diabetic gastroparesis, pancreatitis, cyclic vomiting syndrome, HTN, asthma, and GERD who presented with intractable nausea and vomiting with abdominal pain which has failed to improve with supportive treatments including IV fluids, antiemetics, and tapering narcotic pain medications. NJT placement was attempted but initially unsuccessful in achieving postpyloric positioning. On 2nd reattempt, this was advanced into small bowel and tube feedings begun. Pain remains severe and associated with nausea, vomiting.  Assessment & Plan: Active Problems:   Diabetic gastroparesis (HCC)   Intractable nausea and vomiting   Diabetes mellitus (HCC)  Gastroparesis, intractable nausea and vomiting: Recurrent admissions for same. Severe gastroparesis as evidenced by gastric scintigraphy study in June 2018 showing 2% emptied at 1 hr (normal >= 10%) and only 24% emptied at 4 hr (normal >= 90%)   Endoscopy showed retain food stomach, no anatomical cause for nausea, vomiting.  - Minimize narcotics. This is a constant struggle for the patient, will continue once daily dilaudid prn. - Continue with Reglan. Started erythromycin IV 3/13.  - GI consulted but signed off 3/16. NGT placed 3/16 and subsequent upper GI series showed no cause for ongoing symptoms. - postpyloric feeding tube successfully adjusted 3/18, started tube feeds. D/w RN titration every 8 hours. Go 65ml > 75ml/hr now, goal 27ml/hr.  - Milk of mag sub for miralax per pt report if improved efficacy.  Abnormal ECG, Tachycardia, ST elevation: No chest pain, no pericardial effusion on echocardiogram to suggest pericarditis or WMA.   - Follow up with cardiology as an outpatient.  Abdominal pain: Secondary to gastroparesis.  Also concern for opioid  induced pain.  - Continue toradol in effort to minimize narcotics. Ok to give low dose dilaudid once daily if necessary. Discussed rationale for stopping this with patient today.   Severe protein calorie malnutrition:  - Tube feeds started 3/18. - Nausea improving, pain remains - Monitor phos and electrolytes daily to check for refeeding syndrome.  T1DM: With hypoglycemia.  - DC'ed insulin pump, now on basal-bolus insulin which we'll continue. With tube feeds, CBGs have increased. Will change to q4h.  Hypokalemia: Due to poor intake.  - Added to IVF, will continue monitoring. Decrease concentration.   Hyperchloremia:  - Change to maintenance fluids  Hypomagnesemia:  - Continue checking and supplement again today by IV.   Iron deficiency anemia and AOCD: Exacerbated by ongoing malnutrition. B12 was normal. - Continue iron supplementation.  Constipation: Ongoing issue.  - Continue bowel regimen which patient is intermittently declining.   Neurogenic bladder: She performed self catheterization. - Voiding spontaneously.  Pyuria: Urine culture negative   CoNS bacteremia: Cx from port with repeat NGTD s/p treatment with vancomycin x5 days.   DVT prophylaxis: Xarelto (pt declined lovenox) Code Status: Full Family Communication: None Disposition Plan: Continues to require IV fluids, tube feeding and IV medications. Hopeful to return to home environment once intractable vomiting improved but unknown when that will be.  Consultants:   GI  IR  Procedures:  EGD 3/12: Impression:       - LA Grade A erosive esophagitis with no bleeding.                           - A large amount of food (residue) in the stomach.                           -  Normal examined duodenum. Biopsied.                           - Biopsies were taken with a cold forceps for                            histology in the gastric antrum.                           - The overall picture is consistent with  diabetic                            gastroparesis. No anatomic cause for nausea and                             vomiting seen.  Antimicrobials:  Erythromycin  Vancomycin   Subjective: Pain remains severe, constant, waxing/waning throughout the abdomen associated with nausea and an episode of thin liquid emesis this morning.  Objective: Vitals:   04/06/19 0651 04/06/19 1341 04/06/19 2011 04/07/19 0524  BP: 108/74 102/68 90/64 110/81  Pulse: 80 90 80 85  Resp: 15 18 20 20   Temp: 98.4 F (36.9 C) 98.2 F (36.8 C) 98.4 F (36.9 C) 98.1 F (36.7 C)  TempSrc: Oral Oral Oral Oral  SpO2: 96% 100% 97% 98%  Weight:      Height:        Intake/Output Summary (Last 24 hours) at 04/07/2019 0901 Last data filed at 04/07/2019 0300 Gross per 24 hour  Intake 300 ml  Output 500 ml  Net -200 ml   Filed Weights   03/30/19 0700 03/31/19 0603 04/01/19 0628  Weight: 48 kg 48.4 kg 47.5 kg   Gen: 29 y.o. female in no distress HEENT: Right nare with tube in situ Pulm: Nonlabored breathing room air. Clear. CV: Regular rate and rhythm. No murmur, rub, or gallop. No JVD, no dependent edema. GI: Abdomen soft, diffusely tender, non-distended, with normoactive bowel sounds.  Ext: Warm, no deformities Skin: No rashes, lesions or ulcers on visualized skin. Neuro: Alert and oriented. No focal neurological deficits. Psych: Judgement and insight appear fair. Mood euthymic & affect congruent. Behavior is appropriate.    Data Reviewed: I have personally reviewed following labs and imaging studies  CBC: Recent Labs  Lab 04/03/19 0600 04/04/19 0454 04/05/19 0520  WBC 6.3 6.2 5.1  HGB 9.7* 9.5* 8.8*  HCT 31.3* 30.7* 28.1*  MCV 87.7 86.7 87.8  PLT 359 383 [id number]   Basic Metabolic Panel: Recent Labs  Lab 04/03/19 0600 04/04/19 0454 04/05/19 0520 04/06/19 0450 04/07/19 0551  NA 133* 137 135 142 140  K 3.6 3.3* 3.5 3.5 4.2  CL 102 108 109 115* 112*  CO2 19* 20* 21* 23 24  GLUCOSE 280* 106*  277* 122* 129*  BUN 7 7 7  5* <5*  CREATININE 0.64 0.66 0.55 0.42* 0.40*  CALCIUM 8.4* 8.4* 8.0* 8.2* 8.2*  MG  --   --  1.4* 1.6* 1.8  PHOS  --   --   --  3.3 3.4   GFR: Estimated Creatinine Clearance: 77.8 mL/min (A) (by C-G formula based on SCr of 0.4 mg/dL (L)). Liver Function Tests: Recent Labs  Lab 04/04/19 0454 04/06/19 0450 04/07/19 0551  AST 10* 9* 15  ALT 9 7  8  ALKPHOS 64 58 57  BILITOT 0.6 0.7 0.6  PROT 6.3* 5.3* 5.7*  ALBUMIN 3.4* 2.8* 2.9*   No results for input(s): LIPASE, AMYLASE in the last 168 hours. No results for input(s): AMMONIA in the last 168 hours. Coagulation Profile: No results for input(s): INR, PROTIME in the last 168 hours. Cardiac Enzymes: No results for input(s): CKTOTAL, CKMB, CKMBINDEX, TROPONINI in the last 168 hours. BNP (last 3 results) No results for input(s): PROBNP in the last 8760 hours. HbA1C: No results for input(s): HGBA1C in the last 72 hours. CBG: Recent Labs  Lab 04/06/19 0733 04/06/19 1107 04/06/19 1844 04/06/19 2007 04/07/19 0738  GLUCAP 106* 106* 292* 265* 140*   Lipid Profile: No results for input(s): CHOL, HDL, LDLCALC, TRIG, CHOLHDL, LDLDIRECT in the last 72 hours. Thyroid Function Tests: No results for input(s): TSH, T4TOTAL, FREET4, T3FREE, THYROIDAB in the last 72 hours. Anemia Panel: No results for input(s): VITAMINB12, FOLATE, FERRITIN, TIBC, IRON, RETICCTPCT in the last 72 hours. Urine analysis:    Component Value Date/Time   COLORURINE YELLOW 03/29/2019 1602   APPEARANCEUR HAZY (A) 03/29/2019 1602   LABSPEC 1.018 03/29/2019 1602   PHURINE 6.0 03/29/2019 1602   GLUCOSEU >=500 (A) 03/29/2019 1602   GLUCOSEU >=1000 11/07/2012 1205   HGBUR LARGE (A) 03/29/2019 1602   BILIRUBINUR NEGATIVE 03/29/2019 1602   KETONESUR 80 (A) 03/29/2019 1602   PROTEINUR 30 (A) 03/29/2019 1602   UROBILINOGEN 0.2 11/24/2014 1045   NITRITE NEGATIVE 03/29/2019 1602   LEUKOCYTESUR LARGE (A) 03/29/2019 1602   Recent Results  (from the past 240 hour(s))  Culture, blood (routine x 2)     Status: None   Collection Time: 03/29/19  4:18 PM   Specimen: BLOOD  Result Value Ref Range Status   Specimen Description BLOOD LEFT ARM  Final   Special Requests   Final    BOTTLES DRAWN AEROBIC ONLY Blood Culture results may not be optimal due to an inadequate volume of blood received in culture bottles Performed at [facility], [place] [address] [place], [place] [address]    Culture NO GROWTH 5 DAYS  Final   Report Status 04/03/2019 FINAL  Final  Culture, blood (routine x 2)     Status: None   Collection Time: 03/29/19  4:18 PM   Specimen: BLOOD LEFT HAND  Result Value Ref Range Status   Specimen Description BLOOD LEFT HAND  Final   Special Requests   Final    BOTTLES DRAWN AEROBIC ONLY Blood Culture adequate volume Performed at [place] [address] [place], [place] [address]    Culture NO GROWTH 5 DAYS  Final   Report Status 04/03/2019 FINAL  Final  Urine Culture     Status: None   Collection Time: 03/30/19  5:07 PM   Specimen: Urine, Random  Result Value Ref Range Status   Specimen Description   Final    URINE, RANDOM Performed at [place] [address] [place], [place] [address]    Special Requests   Final    NONE Performed at [facility], [place] [address] [place], [place] [address]    Culture   Final    NO GROWTH Performed at [facility], [place] [address], [place], [place] [address]    Report Status 03/31/2019 FINAL  Final      Radiology Studies: DG Loyce Dys Tube Plc W/Fl W/Rad  Result Date: 04/06/2019 CLINICAL DATA:  Diabetes,  malnutrition.  Intractable vomiting. EXAM: NASO G TUBE PLACEMENT WITH FL AND WITH RAD CONTRAST:  100 mL Omnipaque FLUOROSCOPY TIME:  Fluoroscopy Time:  6 minutes 24 seconds Radiation Exposure Index (if provided by the fluoroscopic device): Number of Acquired Spot Images: 4 COMPARISON:   None. FINDINGS: Feeding tube with weighted tip in the distal stomach. The guidewire was removed. Contrast and air were injected into the distal stomach and proximal duodenum ot visualize the pylorus. An Amplatz guidewire was placed in the feeding tube. Feeding tube was advanced into the second portion duodenum and confirmed by contrast administration. Guidewire was removed. IMPRESSION: Feeding tube advanced into second portion duodenum. Guidewire removed. Feeding tube ready for gradual advancement of nutrition Electronically Signed   By: Suzy Bouchard M.D.   On: 04/06/2019 12:18   DG INTRO LONG GI TUBE  Result Date: 04/05/2019 CLINICAL DATA:  Nausea and vomiting. EXAM: UPPER GI SERIES WITH KUB TECHNIQUE: After obtaining a scout radiograph a routine upper GI series was performed using thin barium. FLUOROSCOPY TIME:  Fluoroscopy Time:  2 minutes 6 seconds Radiation Exposure Index (if provided by the fluoroscopic device): 14.5 mGy Number of Acquired Spot Images: 7 COMPARISON:  None. FINDINGS: Scout view of the abdomen shows retained oral contrast in the colon. Surgical clips in the right upper quadrant. Patient drank barium from a cup. Normal esophageal motility. No esophageal fold thickening, stricture or obstruction. There is suboptimal distension of the stomach as the patient could not drink more than approximately 1/2 to 2/3 of a cup of contrast. Stomach is grossly unremarkable. Duodenum was not visualized. During the exam, it was noted that the feeding tube tip was in the upper esophagus. The wire was in place and therefore the tube was advanced under fluoroscopy into the stomach, to the pylorus. IMPRESSION: 1. No findings to explain the patient's nausea and vomiting. Assessment of the duodenal bulb is difficult due to poor opacification. 2. Feeding tube was advanced under fluoroscopy into the stomach, terminating at the pylorus. Electronically Signed   By: Lorin Picket M.D.   On: 04/05/2019 14:00   DG  UGI W SINGLE CM (SOL OR THIN BA)  Result Date: 04/05/2019 CLINICAL DATA:  Nausea and vomiting. EXAM: UPPER GI SERIES WITH KUB TECHNIQUE: After obtaining a scout radiograph a routine upper GI series was performed using thin barium. FLUOROSCOPY TIME:  Fluoroscopy Time:  2 minutes 6 seconds Radiation Exposure Index (if provided by the fluoroscopic device): 14.5 mGy Number of Acquired Spot Images: 7 COMPARISON:  None. FINDINGS: Scout view of the abdomen shows retained oral contrast in the colon. Surgical clips in the right upper quadrant. Patient drank barium from a cup. Normal esophageal motility. No esophageal fold thickening, stricture or obstruction. There is suboptimal distension of the stomach as the patient could not drink more than approximately 1/2 to 2/3 of a cup of contrast. Stomach is grossly unremarkable. Duodenum was not visualized. During the exam, it was noted that the feeding tube tip was in the upper esophagus. The wire was in place and therefore the tube was advanced under fluoroscopy into the stomach, to the pylorus. IMPRESSION: 1. No findings to explain the patient's nausea and vomiting. Assessment of the duodenal bulb is difficult due to poor opacification. 2. Feeding tube was advanced under fluoroscopy into the stomach, terminating at the pylorus. Electronically Signed   By: Lorin Picket M.D.   On: 04/05/2019 14:00    Scheduled Meds: . Chlorhexidine Gluconate Cloth  6 each Topical Daily  .  insulin aspart  0-5 Units Subcutaneous QHS  . insulin aspart  0-6 Units Subcutaneous TID WC  . insulin aspart  2 Units Subcutaneous TID WC  . insulin glargine  12 Units Subcutaneous Daily  . magnesium hydroxide  15 mL Per Tube Daily  . metoCLOPramide (REGLAN) injection  10 mg Intravenous Q6H  . mirtazapine  15 mg Per Tube QHS  . pantoprazole (PROTONIX) IV  40 mg Intravenous Q12H  . rivaroxaban  10 mg Oral Daily  . senna  1 tablet Oral BID  . sodium chloride flush  10-40 mL Intracatheter Q12H     Continuous Infusions: . 0.9 % NaCl with KCl 40 mEq / L Stopped (04/06/19 1410)  . erythromycin 250 mg (04/07/19 0407)  . feeding supplement (OSMOLITE 1.2 CAL) 25 mL/hr at 04/07/19 0827     LOS: 22 days   Time spent: 35 minutes.  Patrecia Pour, MD [facility] [email or web address] 04/07/2019, 9:01 AM

## 2019-04-08 LAB — COMPREHENSIVE METABOLIC PANEL
ALT: 12 U/L (ref 0–44)
AST: 18 U/L (ref 15–41)
Albumin: 3.4 g/dL — ABNORMAL LOW (ref 3.5–5.0)
Alkaline Phosphatase: 72 U/L (ref 38–126)
Anion gap: 7 (ref 5–15)
BUN: <5 mg/dL — ABNORMAL LOW (ref 6–20)
CO2: 28 mmol/L (ref 22–32)
Calcium: 8.9 mg/dL (ref 8.9–10.3)
Chloride: 104 mmol/L (ref 98–111)
Creatinine, Ser: 0.45 mg/dL (ref 0.44–1.00)
GFR calc Af Amer: >60 mL/min (ref >60)
GFR calc non Af Amer: >60 mL/min (ref >60)
Glucose, Bld: 141 mg/dL — ABNORMAL HIGH (ref 70–99)
Potassium: 4.2 mmol/L (ref 3.5–5.1)
Sodium: 139 mmol/L (ref 135–145)
Total Bilirubin: 0.4 mg/dL (ref 0.3–1.2)
Total Protein: 6.5 g/dL (ref 6.5–8.1)

## 2019-04-08 LAB — PHOSPHORUS: Phosphorus: 3.1 mg/dL (ref 2.5–4.6)

## 2019-04-08 LAB — GLUCOSE, CAPILLARY
Glucose-Capillary: 327 mg/dL — ABNORMAL HIGH (ref 70–99)
Glucose-Capillary: 169 mg/dL — ABNORMAL HIGH (ref 70–99)
Glucose-Capillary: 216 mg/dL — ABNORMAL HIGH (ref 70–99)
Glucose-Capillary: 223 mg/dL — ABNORMAL HIGH (ref 70–99)
Glucose-Capillary: 195 mg/dL — ABNORMAL HIGH (ref 70–99)
Glucose-Capillary: 254 mg/dL — ABNORMAL HIGH (ref 70–99)

## 2019-04-08 LAB — MAGNESIUM: Magnesium: 1.7 mg/dL (ref 1.7–2.4)

## 2019-04-08 MED ORDER — MAGNESIUM SULFATE 2 GM/50ML IV SOLN
2.0000 g | Freq: Once | INTRAVENOUS | Status: AC
  Administered 2019-04-08: 2 g via INTRAVENOUS
  Filled 2019-04-08: qty 50

## 2019-04-08 MED ORDER — SENNOSIDES 8.8 MG/5ML PO SYRP
5.0000 mL | ORAL_SOLUTION | Freq: Two times a day (BID) | ORAL | Status: DC
  Administered 2019-04-08 – 2019-04-11 (×6): 5 mL via ORAL
  Filled 2019-04-08 (×7): qty 5

## 2019-04-08 NOTE — Progress Notes (Signed)
PROGRESS NOTE  [name]  [id number] DOB: 08/30/1990 DOA: 03/16/2019 PCP: Vicenta Aly, FNP   Brief Narrative: [name] is a 29 y.o. female with a history of T1DM on insulin pump, diabetic gastroparesis, pancreatitis, cyclic vomiting syndrome, HTN, asthma, and GERD who presented with intractable nausea and vomiting with abdominal pain which has failed to improve with supportive treatments including IV fluids, antiemetics, and tapering narcotic pain medications. NJT placement was attempted but initially unsuccessful in achieving postpyloric positioning. On 2nd reattempt, this was advanced into small bowel and tube feedings begun. Pain remains severe and associated with nausea, vomiting.  Assessment & Plan: Active Problems:   Diabetic gastroparesis (HCC)   Intractable nausea and vomiting   Diabetes mellitus (HCC)  Gastroparesis, intractable nausea and vomiting: Recurrent admissions for same. Severe gastroparesis as evidenced by gastric scintigraphy study in June 2018 showing 2% emptied at 1 hr (normal >= 10%) and only 24% emptied at 4 hr (normal >= 90%). Endoscopy showed retain food stomach, no anatomical cause for nausea, vomiting.  - Minimize narcotics. This is a constant struggle for the patient, will continue once daily dilaudid prn. - Continue with Reglan. Started erythromycin IV 3/13.  - GI consulted but signed off 3/16. NGT placed 3/16 and subsequent upper GI series showed no cause for ongoing symptoms. - Postpyloric feeding tube successfully adjusted 3/18, started tube feeds. Today will increase to goal 2ml/hr. D/w RN. - Milk of mag sub for miralax per pt report if improved efficacy.  Abnormal ECG, Tachycardia, ST elevation: No chest pain, no pericardial effusion on echocardiogram to suggest pericarditis or WMA.   - Follow up with cardiology as an outpatient.  Abdominal pain: Secondary to gastroparesis.  Also concern for opioid induced pain.  - Continue  non-narcotic medications as much as possible. Will continue IV dilaudid if needed due to impaired absorption of po. While giving this, will keep NPO.  Severe protein calorie malnutrition:  - Tube feeds started 3/18, at goal as of 3/20. Dietitian recommendations are appreciated. - Monitor phos and electrolytes daily to check for refeeding syndrome.  T1DM: With hypoglycemia.  - DC'ed insulin pump, now on basal-bolus insulin which we'll continue. With tube feeds, CBGs have increased. Will change to q4h.  Hypokalemia: Due to poor intake.  - Added to IVF, stable w/current concentration. Will continue monitoring daily.   Hyperchloremia:  - Improved on hypotonic saline.   Hypomagnesemia:  - Continue checking and supplement again today by IV.   Iron deficiency anemia and AOCD: Exacerbated by ongoing malnutrition. B12 was normal. - Continue iron supplementation.  Constipation: Ongoing issue.  - Continue bowel regimen, has had BM. Anticipate possible improvement now that postpyloric feedings are started.  Neurogenic bladder: She performs self catheterization. - Voiding spontaneously.  - I/O catheterize prn.  Pyuria: Urine culture negative   CoNS bacteremia: Cx from port with repeat NGTD s/p treatment with vancomycin x5 days.   DVT prophylaxis: Xarelto (pt declined lovenox) Code Status: Full Family Communication: None Disposition Plan: Continues to require IV fluids, tube feeding and IV medications. Hopeful to return to home environment once intractable vomiting improved but unknown when that will be.  Consultants:   GI  IR  Procedures:  EGD 3/12: Impression:       - LA Grade A erosive esophagitis with no bleeding.                           - A large amount of food (residue)  in the stomach.                           - Normal examined duodenum. Biopsied.                           - Biopsies were taken with a cold forceps for                            histology in the  gastric antrum.                           - The overall picture is consistent with diabetic                            gastroparesis. No anatomic cause for nausea and                             vomiting seen.  Antimicrobials:  Erythromycin 3/13 >>   Vancomycin 3/10 - 3/15  Subjective: Pain worsened yesterday requiring an additional dose of dilaudid. Made NPO and pain is minimal at time of interview though she feels hungry.   Objective: Vitals:   04/07/19 0524 04/07/19 1332 04/07/19 2028 04/08/19 0600  BP: 110/81 (!) 123/96 108/79 110/86  Pulse: 85 95 86 92  Resp: 20 16 20 20   Temp: 98.1 F (36.7 C) 99.9 F (37.7 C) 99.6 F (37.6 C) 98.9 F (37.2 C)  TempSrc: Oral Oral Oral Oral  SpO2: 98% 100% 100% 100%  Weight:      Height:        Intake/Output Summary (Last 24 hours) at 04/08/2019 1325 Last data filed at 04/08/2019 0300 Gross per 24 hour  Intake --  Output 3301 ml  Net -3301 ml   Filed Weights   03/30/19 0700 03/31/19 0603 04/01/19 0628  Weight: 48 kg 48.4 kg 47.5 kg   Gen: 29 y.o. female in no distress Pulm: Nonlabored breathing room air. Clear. CV: Regular rate and rhythm. No murmur, rub, or gallop. No JVD, no dependent edema. GI: Abdomen soft, not currently significantly tender, non-distended, with normoactive bowel sounds.  Ext: Warm, no deformities Skin: No rashes, lesions or ulcers on visualized skin. Neuro: Alert and oriented. No focal neurological deficits. Psych: Judgement and insight appear fair. Mood euthymic & affect congruent. Behavior is appropriate.    Data Reviewed: I have personally reviewed following labs and imaging studies  CBC: Recent Labs  Lab 04/03/19 0600 04/04/19 0454 04/05/19 0520  WBC 6.3 6.2 5.1  HGB 9.7* 9.5* 8.8*  HCT 31.3* 30.7* 28.1*  MCV 87.7 86.7 87.8  PLT 359 383 [id number]   Basic Metabolic Panel: Recent Labs  Lab 04/04/19 0454 04/05/19 0520 04/06/19 0450 04/07/19 0551 04/08/19 0634  NA 137 135 142 140 139  K  3.3* 3.5 3.5 4.2 4.2  CL 108 109 115* 112* 104  CO2 20* 21* 23 24 28   GLUCOSE 106* 277* 122* 129* 141*  BUN 7 7 5* <5* <5*  CREATININE 0.66 0.55 0.42* 0.40* 0.45  CALCIUM 8.4* 8.0* 8.2* 8.2* 8.9  MG  --  1.4* 1.6* 1.8 1.7  PHOS  --   --  3.3 3.4 3.1   GFR: Estimated Creatinine Clearance: 77.8 mL/min (by C-G formula  based on SCr of 0.45 mg/dL). Liver Function Tests: Recent Labs  Lab 04/04/19 0454 04/06/19 0450 04/07/19 0551 04/08/19 0634  AST 10* 9* 15 18  ALT 9 7 8 12   ALKPHOS 64 58 57 72  BILITOT 0.6 0.7 0.6 0.4  PROT 6.3* 5.3* 5.7* 6.5  ALBUMIN 3.4* 2.8* 2.9* 3.4*   No results for input(s): LIPASE, AMYLASE in the last 168 hours. No results for input(s): AMMONIA in the last 168 hours. Coagulation Profile: No results for input(s): INR, PROTIME in the last 168 hours. Cardiac Enzymes: No results for input(s): CKTOTAL, CKMB, CKMBINDEX, TROPONINI in the last 168 hours. BNP (last 3 results) No results for input(s): PROBNP in the last 8760 hours. HbA1C: No results for input(s): HGBA1C in the last 72 hours. CBG: Recent Labs  Lab 04/07/19 2026 04/08/19 0126 04/08/19 0417 04/08/19 0738 04/08/19 1129  GLUCAP 177* 327* 169* 216* 223*   Lipid Profile: No results for input(s): CHOL, HDL, LDLCALC, TRIG, CHOLHDL, LDLDIRECT in the last 72 hours. Thyroid Function Tests: No results for input(s): TSH, T4TOTAL, FREET4, T3FREE, THYROIDAB in the last 72 hours. Anemia Panel: No results for input(s): VITAMINB12, FOLATE, FERRITIN, TIBC, IRON, RETICCTPCT in the last 72 hours. Urine analysis:    Component Value Date/Time   COLORURINE YELLOW 03/29/2019 1602   APPEARANCEUR HAZY (A) 03/29/2019 1602   LABSPEC 1.018 03/29/2019 1602   PHURINE 6.0 03/29/2019 1602   GLUCOSEU >=500 (A) 03/29/2019 1602   GLUCOSEU >=1000 11/07/2012 1205   HGBUR LARGE (A) 03/29/2019 1602   BILIRUBINUR NEGATIVE 03/29/2019 1602   KETONESUR 80 (A) 03/29/2019 1602   PROTEINUR 30 (A) 03/29/2019 1602    UROBILINOGEN 0.2 11/24/2014 1045   NITRITE NEGATIVE 03/29/2019 1602   LEUKOCYTESUR LARGE (A) 03/29/2019 1602   Recent Results (from the past 240 hour(s))  Culture, blood (routine x 2)     Status: None   Collection Time: 03/29/19  4:18 PM   Specimen: BLOOD  Result Value Ref Range Status   Specimen Description BLOOD LEFT ARM  Final   Special Requests   Final    BOTTLES DRAWN AEROBIC ONLY Blood Culture results may not be optimal due to an inadequate volume of blood received in culture bottles Performed at [facility], [place] [address] [place], [place] [address]    Culture NO GROWTH 5 DAYS  Final   Report Status 04/03/2019 FINAL  Final  Culture, blood (routine x 2)     Status: None   Collection Time: 03/29/19  4:18 PM   Specimen: BLOOD LEFT HAND  Result Value Ref Range Status   Specimen Description BLOOD LEFT HAND  Final   Special Requests   Final    BOTTLES DRAWN AEROBIC ONLY Blood Culture adequate volume Performed at [place] [address] [place], [place] [address]    Culture NO GROWTH 5 DAYS  Final   Report Status 04/03/2019 FINAL  Final  Urine Culture     Status: None   Collection Time: 03/30/19  5:07 PM   Specimen: Urine, Random  Result Value Ref Range Status   Specimen Description   Final    URINE, RANDOM Performed at [place] [address] [place], [place] [address]    Special Requests   Final    NONE Performed at [facility], [place] [address] [place], [place] [address]    Culture   Final    NO GROWTH Performed at [facility], [place]  [address], [place], [place] [address]    Report Status 03/31/2019 FINAL  Final      Radiology Studies: No results found.  Scheduled Meds: . Chlorhexidine Gluconate Cloth  6 each Topical Daily  . insulin aspart  0-9 Units Subcutaneous Q4H  . insulin glargine  12 Units Subcutaneous Daily  . magnesium hydroxide  15 mL Per Tube Daily  .  metoCLOPramide (REGLAN) injection  10 mg Intravenous Q6H  . mirtazapine  15 mg Per Tube QHS  . pantoprazole (PROTONIX) IV  40 mg Intravenous Q12H  . rivaroxaban  10 mg Oral Daily  . sennosides  5 mL Oral BID  . sodium chloride flush  10-40 mL Intracatheter Q12H   Continuous Infusions: . 0.45 % NaCl with KCl 20 mEq / L 100 mL/hr at 04/08/19 0117  . erythromycin 250 mg (04/08/19 1118)  . feeding supplement (OSMOLITE 1.2 CAL) 1,000 mL (04/08/19 0650)     LOS: 23 days   Time spent: 35 minutes.  Patrecia Pour, MD [facility] [email or web address] 04/08/2019, 1:25 PM

## 2019-04-09 LAB — CBC
HCT: 30.1 % — ABNORMAL LOW (ref 36.0–46.0)
Hemoglobin: 9.3 g/dL — ABNORMAL LOW (ref 12.0–15.0)
MCH: 27.3 pg (ref 26.0–34.0)
MCHC: 30.9 g/dL (ref 30.0–36.0)
MCV: 88.3 fL (ref 80.0–100.0)
Platelets: 377 10*3/uL (ref 150–400)
RBC: 3.41 MIL/uL — ABNORMAL LOW (ref 3.87–5.11)
RDW: 16.9 % — ABNORMAL HIGH (ref 11.5–15.5)
WBC: 4.7 10*3/uL (ref 4.0–10.5)
nRBC: 0 % (ref 0.0–0.2)

## 2019-04-09 LAB — COMPREHENSIVE METABOLIC PANEL
ALT: 9 U/L (ref 0–44)
AST: 14 U/L — ABNORMAL LOW (ref 15–41)
Albumin: 2.8 g/dL — ABNORMAL LOW (ref 3.5–5.0)
Alkaline Phosphatase: 61 U/L (ref 38–126)
Anion gap: 5 (ref 5–15)
BUN: 8 mg/dL (ref 6–20)
CO2: 30 mmol/L (ref 22–32)
Calcium: 8.5 mg/dL — ABNORMAL LOW (ref 8.9–10.3)
Chloride: 103 mmol/L (ref 98–111)
Creatinine, Ser: 0.43 mg/dL — ABNORMAL LOW (ref 0.44–1.00)
GFR calc Af Amer: >60 mL/min (ref >60)
GFR calc non Af Amer: >60 mL/min (ref >60)
Glucose, Bld: 152 mg/dL — ABNORMAL HIGH (ref 70–99)
Potassium: 4.5 mmol/L (ref 3.5–5.1)
Sodium: 138 mmol/L (ref 135–145)
Total Bilirubin: 0.3 mg/dL (ref 0.3–1.2)
Total Protein: 5.3 g/dL — ABNORMAL LOW (ref 6.5–8.1)

## 2019-04-09 LAB — PHOSPHORUS: Phosphorus: 4.5 mg/dL (ref 2.5–4.6)

## 2019-04-09 LAB — GLUCOSE, CAPILLARY
Glucose-Capillary: 125 mg/dL — ABNORMAL HIGH (ref 70–99)
Glucose-Capillary: 170 mg/dL — ABNORMAL HIGH (ref 70–99)
Glucose-Capillary: 222 mg/dL — ABNORMAL HIGH (ref 70–99)
Glucose-Capillary: 238 mg/dL — ABNORMAL HIGH (ref 70–99)
Glucose-Capillary: 296 mg/dL — ABNORMAL HIGH (ref 70–99)
Glucose-Capillary: 382 mg/dL — ABNORMAL HIGH (ref 70–99)

## 2019-04-09 LAB — MAGNESIUM: Magnesium: 1.8 mg/dL (ref 1.7–2.4)

## 2019-04-09 MED ORDER — MAGNESIUM HYDROXIDE 400 MG/5ML PO SUSP: 15.0000 mL | Freq: Every day | ORAL | Status: DC | PRN

## 2019-04-09 NOTE — Progress Notes (Signed)
PROGRESS NOTE  [name]  [id number] DOB: [birth year] DOA: 03/16/2019 PCP: Vicenta Aly, FNP   Brief Narrative: [name] is a 29 y.o. female with a history of T1DM on insulin pump, diabetic gastroparesis, pancreatitis, cyclic vomiting syndrome, HTN, asthma, and GERD who presented with intractable nausea and vomiting with abdominal pain which has failed to improve with supportive treatments including IV fluids, antiemetics, and tapering narcotic pain medications. NJT placement was attempted but initially unsuccessful in achieving postpyloric positioning. On 2nd reattempt, this was advanced into small bowel and tube feedings begun. Pain remains severe and associated with nausea, vomiting.  Assessment & Plan: Active Problems:   Diabetic gastroparesis (HCC)   Intractable nausea and vomiting   Diabetes mellitus (HCC)  Gastroparesis, intractable nausea and vomiting: Recurrent admissions for same. Severe gastroparesis as evidenced by gastric scintigraphy study in June 2018 showing 2% emptied at 1 hr (normal >= 10%) and only 24% emptied at 4 hr (normal >= 90%). Endoscopy showed retain food stomach, no anatomical cause for nausea, vomiting.  - Minimize narcotics. With improved symptoms, will stop IV dilaudid, advance to clear liquids. - Continue with Reglan. Started erythromycin IV 3/13.  - GI consulted but signed off 3/16. NGT placed 3/16 and subsequent upper GI series showed no cause for ongoing symptoms. - Postpyloric feeding tube successfully adjusted 3/18, started tube feeds, continue at 55cc/hr. - Stop bowel regimen with loose stools after initiating tube feeding  Abnormal ECG, Tachycardia, ST elevation: No chest pain, no pericardial effusion on echocardiogram to suggest pericarditis or WMA.   - Follow up with cardiology as an outpatient.  Abdominal pain: Secondary to gastroparesis.  Also concern for opioid induced pain.  - Continue non-narcotic medications as much as  possible.   Severe protein calorie malnutrition:  - Tube feeds started 3/18, at goal as of 3/20. Dietitian recommendations are appreciated. - Monitor phos and electrolytes daily to check for refeeding syndrome. Doing well.  T1DM: With hypoglycemia.  - DC'ed insulin pump, now on basal-bolus insulin which we'll continue.   Hypokalemia: Due to poor intake.  - Added to IVF, stable w/current concentration, decrease rate with initiation of clear liquid diet. K 4.5. Will continue monitoring daily.   Hyperchloremia:  - Improved on hypotonic saline.   Hypomagnesemia:  - Continue checking and supplement again today by IV.   Iron deficiency anemia and AOCD: Exacerbated by ongoing malnutrition. B12 was normal. - Continue iron supplementation.  Constipation: Ongoing issue resolved with tube feeds.  - Continue senna.  Neurogenic bladder: She performs self catheterization. - Voiding spontaneously.  - I/O catheterize prn.  Pyuria: Urine culture negative   CoNS bacteremia: Cx from port with repeat NGTD s/p treatment with vancomycin x5 days.   DVT prophylaxis: Xarelto (pt declined lovenox) Code Status: Full Family Communication: None Disposition Plan: Continues to require IV fluids, tube feeding and IV medications. Hopeful to return to home environment once intractable vomiting improved but unknown when that will be.  Consultants:   GI  IR  Procedures:  EGD 3/12: Impression:       - LA Grade A erosive esophagitis with no bleeding.                           - A large amount of food (residue) in the stomach.                           - Normal examined duodenum.  Biopsied.                           - Biopsies were taken with a cold forceps for                            histology in the gastric antrum.                           - The overall picture is consistent with diabetic                            gastroparesis. No anatomic cause for nausea and                              vomiting seen.  Antimicrobials:  Erythromycin 3/13 >>   Vancomycin 3/10 - 3/15  Subjective: Pain better, absent this morning. Nausea improved, waxing/waning without provocative factors, improved with medication. Having regular stools. Wants to eat/drink.   Objective: Vitals:   04/08/19 0600 04/08/19 1415 04/08/19 2113 04/09/19 0609  BP: 110/86 (!) 125/91 109/82 98/77  Pulse: 92 99 85 86  Resp: 20 18 16 16   Temp: 98.9 F (37.2 C) 100 F (37.8 C) 98.2 F (36.8 C) 98.4 F (36.9 C)  TempSrc: Oral Oral Oral Oral  SpO2: 100% 97% 97% 100%  Weight:      Height:        Intake/Output Summary (Last 24 hours) at 04/09/2019 1227 Last data filed at 04/08/2019 2234 Gross per 24 hour  Intake 40 ml  Output --  Net 40 ml   Filed Weights   03/30/19 0700 03/31/19 0603 04/01/19 0628  Weight: 48 kg 48.4 kg 47.5 kg   Gen: 29 y.o. female in no distress Pulm: Nonlabored breathing room air. Clear. CV: Regular rate and rhythm. No murmur, rub, or gallop. No JVD, no dependent edema. GI: Abdomen soft, not at all tender, non-distended, with normoactive bowel sounds.  Ext: Warm, no deformities Skin: No rashes, lesions or ulcers on visualized skin. Neuro: Alert and oriented. No focal neurological deficits. Psych: Judgement and insight appear fair. Mood euthymic & affect congruent. Behavior is appropriate.    Data Reviewed: I have personally reviewed following labs and imaging studies  CBC: Recent Labs  Lab 04/03/19 0600 04/04/19 0454 04/05/19 0520 04/09/19 0516  WBC 6.3 6.2 5.1 4.7  HGB 9.7* 9.5* 8.8* 9.3*  HCT 31.3* 30.7* 28.1* 30.1*  MCV 87.7 86.7 87.8 88.3  PLT 359 383 326 [id number]   Basic Metabolic Panel: Recent Labs  Lab 04/05/19 0520 04/06/19 0450 04/07/19 0551 04/08/19 0634 04/09/19 0516  NA 135 142 140 139 138  K 3.5 3.5 4.2 4.2 4.5  CL 109 115* 112* 104 103  CO2 21* 23 24 28 30   GLUCOSE 277* 122* 129* 141* 152*  BUN 7 5* <5* <5* 8  CREATININE 0.55 0.42* 0.40* 0.45 0.43*   CALCIUM 8.0* 8.2* 8.2* 8.9 8.5*  MG 1.4* 1.6* 1.8 1.7 1.8  PHOS  --  3.3 3.4 3.1 4.5   GFR: Estimated Creatinine Clearance: 77.8 mL/min (A) (by C-G formula based on SCr of 0.43 mg/dL (L)). Liver Function Tests: Recent Labs  Lab 04/04/19 0454 04/06/19 0450 04/07/19 0551 04/08/19 0634 04/09/19 0516  AST 10* 9* 15 18 14*  ALT 9 7 8 12 9   ALKPHOS 64 58 57 72 61  BILITOT 0.6 0.7 0.6 0.4 0.3  PROT 6.3* 5.3* 5.7* 6.5 5.3*  ALBUMIN 3.4* 2.8* 2.9* 3.4* 2.8*   No results for input(s): LIPASE, AMYLASE in the last 168 hours. No results for input(s): AMMONIA in the last 168 hours. Coagulation Profile: No results for input(s): INR, PROTIME in the last 168 hours. Cardiac Enzymes: No results for input(s): CKTOTAL, CKMB, CKMBINDEX, TROPONINI in the last 168 hours. BNP (last 3 results) No results for input(s): PROBNP in the last 8760 hours. HbA1C: No results for input(s): HGBA1C in the last 72 hours. CBG: Recent Labs  Lab 04/08/19 1956 04/09/19 0004 04/09/19 0355 04/09/19 0737 04/09/19 1201  GLUCAP 254* 222* 170* 125* 296*   Lipid Profile: No results for input(s): CHOL, HDL, LDLCALC, TRIG, CHOLHDL, LDLDIRECT in the last 72 hours. Thyroid Function Tests: No results for input(s): TSH, T4TOTAL, FREET4, T3FREE, THYROIDAB in the last 72 hours. Anemia Panel: No results for input(s): VITAMINB12, FOLATE, FERRITIN, TIBC, IRON, RETICCTPCT in the last 72 hours. Urine analysis:    Component Value Date/Time   COLORURINE YELLOW 03/29/2019 1602   APPEARANCEUR HAZY (A) 03/29/2019 1602   LABSPEC 1.018 03/29/2019 1602   PHURINE 6.0 03/29/2019 1602   GLUCOSEU >=500 (A) 03/29/2019 1602   GLUCOSEU >=1000 11/07/2012 1205   HGBUR LARGE (A) 03/29/2019 1602   BILIRUBINUR NEGATIVE 03/29/2019 1602   KETONESUR 80 (A) 03/29/2019 1602   PROTEINUR 30 (A) 03/29/2019 1602   UROBILINOGEN 0.2 11/24/2014 1045   NITRITE NEGATIVE 03/29/2019 1602   LEUKOCYTESUR LARGE (A) 03/29/2019 1602   Recent Results  (from the past 240 hour(s))  Urine Culture     Status: None   Collection Time: 03/30/19  5:07 PM   Specimen: Urine, Random  Result Value Ref Range Status   Specimen Description   Final    URINE, RANDOM Performed at [facility] [address], [place], [place] [address]    Special Requests   Final    NONE Performed at [facility], [place] [address], [place], [place] [address]    Culture   Final    NO GROWTH Performed at [facility], [place] [address] [place],  [address]    Report Status 03/31/2019 FINAL  Final      Radiology Studies: No results found.  Scheduled Meds: . Chlorhexidine Gluconate Cloth  6 each Topical Daily  . insulin aspart  0-9 Units Subcutaneous Q4H  . insulin glargine  12 Units Subcutaneous Daily  . magnesium hydroxide  15 mL Per Tube Daily  . metoCLOPramide (REGLAN) injection  10 mg Intravenous Q6H  . mirtazapine  15 mg Per Tube QHS  . pantoprazole (PROTONIX) IV  40 mg Intravenous Q12H  . rivaroxaban  10 mg Oral Daily  . sennosides  5 mL Oral BID  . sodium chloride flush  10-40 mL Intracatheter Q12H   Continuous Infusions: . 0.45 % NaCl with KCl 20 mEq / L 100 mL/hr at 04/09/19 0108  . erythromycin 250 mg (04/09/19 1102)  . feeding supplement (OSMOLITE 1.2 CAL) 55 mL/hr at 04/08/19 1000     LOS: 24 days   Time spent: 25 minutes.  Patrecia Pour, MD [facility] [email or web address] 04/09/2019, 12:27 PM

## 2019-04-10 LAB — COMPREHENSIVE METABOLIC PANEL
ALT: 10 U/L (ref 0–44)
AST: 14 U/L — ABNORMAL LOW (ref 15–41)
Albumin: 3.2 g/dL — ABNORMAL LOW (ref 3.5–5.0)
Alkaline Phosphatase: 67 U/L (ref 38–126)
Anion gap: 7 (ref 5–15)
BUN: 11 mg/dL (ref 6–20)
CO2: 31 mmol/L (ref 22–32)
Calcium: 8.8 mg/dL — ABNORMAL LOW (ref 8.9–10.3)
Chloride: 98 mmol/L (ref 98–111)
Creatinine, Ser: 0.55 mg/dL (ref 0.44–1.00)
GFR calc Af Amer: >60 mL/min (ref >60)
GFR calc non Af Amer: >60 mL/min (ref >60)
Glucose, Bld: 299 mg/dL — ABNORMAL HIGH (ref 70–99)
Potassium: 5.3 mmol/L — ABNORMAL HIGH (ref 3.5–5.1)
Sodium: 136 mmol/L (ref 135–145)
Total Bilirubin: 0.6 mg/dL (ref 0.3–1.2)
Total Protein: 5.9 g/dL — ABNORMAL LOW (ref 6.5–8.1)

## 2019-04-10 LAB — GLUCOSE, CAPILLARY
Glucose-Capillary: 128 mg/dL — ABNORMAL HIGH (ref 70–99)
Glucose-Capillary: 273 mg/dL — ABNORMAL HIGH (ref 70–99)
Glucose-Capillary: 304 mg/dL — ABNORMAL HIGH (ref 70–99)
Glucose-Capillary: 305 mg/dL — ABNORMAL HIGH (ref 70–99)
Glucose-Capillary: 325 mg/dL — ABNORMAL HIGH (ref 70–99)
Glucose-Capillary: 500 mg/dL — ABNORMAL HIGH (ref 70–99)

## 2019-04-10 LAB — CBC
HCT: 31.6 % — ABNORMAL LOW (ref 36.0–46.0)
Hemoglobin: 9.8 g/dL — ABNORMAL LOW (ref 12.0–15.0)
MCH: 27.2 pg (ref 26.0–34.0)
MCHC: 31 g/dL (ref 30.0–36.0)
MCV: 87.8 fL (ref 80.0–100.0)
Platelets: 401 10*3/uL — ABNORMAL HIGH (ref 150–400)
RBC: 3.6 MIL/uL — ABNORMAL LOW (ref 3.87–5.11)
RDW: 17 % — ABNORMAL HIGH (ref 11.5–15.5)
WBC: 4.9 10*3/uL (ref 4.0–10.5)
nRBC: 0 % (ref 0.0–0.2)

## 2019-04-10 LAB — MAGNESIUM: Magnesium: 1.8 mg/dL (ref 1.7–2.4)

## 2019-04-10 LAB — PHOSPHORUS: Phosphorus: 5.6 mg/dL — ABNORMAL HIGH (ref 2.5–4.6)

## 2019-04-10 MED ORDER — HYDROMORPHONE HCL 1 MG/ML IJ SOLN
0.5000 mg | Freq: Two times a day (BID) | INTRAMUSCULAR | Status: DC | PRN
  Administered 2019-04-10 – 2019-04-12 (×5): 0.5 mg via INTRAVENOUS
  Filled 2019-04-10 (×5): qty 0.5

## 2019-04-10 MED ORDER — INSULIN GLARGINE 100 UNIT/ML ~~LOC~~ SOLN
16.0000 [IU] | Freq: Every day | SUBCUTANEOUS | Status: DC
  Administered 2019-04-10: 16 [IU] via SUBCUTANEOUS
  Filled 2019-04-10: qty 0.16

## 2019-04-10 MED ORDER — INSULIN ASPART 100 UNIT/ML ~~LOC~~ SOLN
2.0000 [IU] | SUBCUTANEOUS | Status: DC
  Administered 2019-04-10 – 2019-04-11 (×7): 2 [IU] via SUBCUTANEOUS

## 2019-04-10 NOTE — Progress Notes (Signed)
Inpatient Diabetes Program Recommendations  AACE/ADA: New Consensus Statement on Inpatient Glycemic Control (2015)  Target Ranges:  Prepandial:   less than 140 mg/dL      Peak postprandial:   less than 180 mg/dL (1-2 hours)      Critically ill patients:  140 - 180 mg/dL   Lab Results  Component Value Date   GLUCAP 325 (H) 04/10/2019   HGBA1C 7.8 (H) 02/18/2019    Review of Glycemic Control  Diabetes history: DM1 Outpatient Diabetes medications: Insulin pump Current orders for Inpatient glycemic control: Lantus 16 units QD, Novolog 0-9 units Q4H + 2 units Q4H.  Blood sugars very labile. Added Novolog 2 units Q4H for TF coverage CBGs today: 128, 500, 304, 325, 305 mg/dL  Inpatient Diabetes Program Recommendations:     Agree with orders. May need to titrate TF coverage daily.  Follow closely.  Thank you. Lorenda Peck, RD, LDN, CDE Inpatient Diabetes Coordinator [phone]

## 2019-04-10 NOTE — Progress Notes (Addendum)
PROGRESS NOTE  [name]  [id number] DOB: 12/26/1990 DOA: 03/16/2019 PCP: Vicenta Aly, FNP   Brief Narrative: [name] is a 29 y.o. female with a history of T1DM on insulin pump, diabetic gastroparesis, pancreatitis, cyclic vomiting syndrome, HTN, asthma, and GERD who presented with intractable nausea and vomiting with abdominal pain which has failed to improve with supportive treatments including IV fluids, antiemetics, and tapering narcotic pain medications. NJT placement was attempted but initially unsuccessful in achieving postpyloric positioning. On 2nd reattempt, this was advanced into small bowel and tube feedings begun. Pain remains severe and associated with nausea, vomiting.  Assessment & Plan: Active Problems:   Diabetic gastroparesis (HCC)   Intractable nausea and vomiting   Diabetes mellitus (HCC)  Gastroparesis, intractable nausea and vomiting: Recurrent admissions for same. Severe gastroparesis as evidenced by gastric scintigraphy study in June 2018 showing 2% emptied at 1 hr (normal >= 10%) and only 24% emptied at 4 hr (normal >= 90%). Endoscopy showed retain food stomach, no anatomical cause for nausea, vomiting.  - Minimize narcotics. With pain, will make NPO except ice chips and restart prn dilaudid. - Continue with Reglan. Started erythromycin IV 3/13.  - GI consulted but signed off 3/16. NGT placed 3/16 and subsequent upper GI series showed no cause for ongoing symptoms. - Postpyloric feeding tube successfully adjusted 3/18, started tube feeds, continue at 55cc/hr. - Stopped bowel regimen with loose stools after initiating tube feeding  Abnormal ECG, Tachycardia, ST elevation: No chest pain, no pericardial effusion on echocardiogram to suggest pericarditis or WMA.   - Follow up with cardiology as an outpatient.  Abdominal pain: Secondary to gastroparesis.  Also concern for opioid induced pain.  - Continue non-narcotic medications as much as  possible.   Severe protein calorie malnutrition:  - Tube feeds started 3/18, at goal as of 3/20. Dietitian recommendations are appreciated. - Monitor phos and electrolytes daily to check for refeeding syndrome. Doing well.  T1DM: With hypoglycemia, worsening hyperglycemia once tube feeds at goal.  - DC'ed insulin pump. - Increase lantus to 16u daily and add 2u q4h novolog to SSI.  Low grade fever:  - Bladder scan.   Hypokalemia: Due to poor intake. K a bit high this morning in setting of hyperglycemia and K in IVF.  - Stil K in IVF, continue monitoring. Tx w/insulin.   Hyperchloremia:  - Resolved.  Hypomagnesemia:  - Continue checking and supplement as needed  Iron deficiency anemia and AOCD: Exacerbated by ongoing malnutrition. B12 was normal. - Continue iron supplementation.  Constipation: Ongoing issue resolved with tube feeds.   Neurogenic bladder: She performs self catheterization. - Voiding spontaneously.  - I/O catheterize prn.  Pyuria: Urine culture negative   CoNS bacteremia: Cx from port with repeat NGTD s/p treatment with vancomycin x5 days.   DVT prophylaxis: Xarelto (pt declined lovenox) Code Status: Full Family Communication: None. Spoke with mother at length 3/21. Disposition Plan: Continues to require IV fluids, tube feeding and IV medications. Hopeful to return to home environment once intractable vomiting improved but unknown when that will be.  Consultants:   GI  IR  Procedures:  EGD 3/12: Impression:       - LA Grade A erosive esophagitis with no bleeding.                           - A large amount of food (residue) in the stomach.                           -  Normal examined duodenum. Biopsied.                           - Biopsies were taken with a cold forceps for                            histology in the gastric antrum.                           - The overall picture is consistent with diabetic                             gastroparesis. No anatomic cause for nausea and                             vomiting seen.  Antimicrobials:  Erythromycin 3/13 >>   Vancomycin 3/10 - 3/15  Subjective: Pain worse, throughout abdomen, aching, sharp, constant, unchanged from previous pains but now worse.   Objective: Vitals:   04/09/19 0609 04/09/19 1402 04/09/19 2116 04/10/19 0557  BP: 98/77 129/90 95/69 (!) 126/94  Pulse: 86 (!) 101 94 (!) 103  Resp: 16 18 17 16   Temp: 98.4 F (36.9 C) 99.6 F (37.6 C) 98.6 F (37 C) 98.4 F (36.9 C)  TempSrc: Oral Oral Oral Oral  SpO2: 100% 99% 97% 96%  Weight:      Height:        Intake/Output Summary (Last 24 hours) at 04/10/2019 1413 Last data filed at 04/10/2019 1120 Gross per 24 hour  Intake 660 ml  Output 600 ml  Net 60 ml   Filed Weights   03/30/19 0700 03/31/19 0603 04/01/19 0628  Weight: 48 kg 48.4 kg 47.5 kg   Gen: 29 y.o. female in no distress Pulm: Nonlabored breathing room air. Clear. CV: Regular rate and rhythm. No murmur, rub, or gallop. No JVD, no dependent edema. GI: Abdomen soft, moderately tender throughout without masses, non-distended, with normoactive bowel sounds.  Ext: Warm, no deformities Skin: No rashes, lesions or ulcers on visualized skin. Neuro: Alert and oriented. No focal neurological deficits. Psych: Judgement and insight appear fair. Mood euthymic & affect congruent. Behavior is appropriate.   Data Reviewed: I have personally reviewed following labs and imaging studies  CBC: Recent Labs  Lab 04/04/19 0454 04/05/19 0520 04/09/19 0516 04/10/19 0603  WBC 6.2 5.1 4.7 4.9  HGB 9.5* 8.8* 9.3* 9.8*  HCT 30.7* 28.1* 30.1* 31.6*  MCV 86.7 87.8 88.3 87.8  PLT 383 326 377 [id number]*   Basic Metabolic Panel: Recent Labs  Lab 04/06/19 0450 04/07/19 0551 04/08/19 0634 04/09/19 0516 04/10/19 0603  NA 142 140 139 138 136  K 3.5 4.2 4.2 4.5 5.3*  CL 115* 112* 104 103 98  CO2 23 24 28 30 31   GLUCOSE 122* 129* 141* 152* 299*  BUN 5*  <5* <5* 8 11  CREATININE 0.42* 0.40* 0.45 0.43* 0.55  CALCIUM 8.2* 8.2* 8.9 8.5* 8.8*  MG 1.6* 1.8 1.7 1.8 1.8  PHOS 3.3 3.4 3.1 4.5 5.6*   GFR: Estimated Creatinine Clearance: 77.8 mL/min (by C-G formula based on SCr of 0.55 mg/dL). Liver Function Tests: Recent Labs  Lab 04/06/19 0450 04/07/19 0551 04/08/19 0634 04/09/19 0516 04/10/19 0603  AST 9* 15 18 14* 14*  ALT 7 8 12  9 10  ALKPHOS 58 57 72 61 67  BILITOT 0.7 0.6 0.4 0.3 0.6  PROT 5.3* 5.7* 6.5 5.3* 5.9*  ALBUMIN 2.8* 2.9* 3.4* 2.8* 3.2*   No results for input(s): LIPASE, AMYLASE in the last 168 hours. No results for input(s): AMMONIA in the last 168 hours. Coagulation Profile: No results for input(s): INR, PROTIME in the last 168 hours. Cardiac Enzymes: No results for input(s): CKTOTAL, CKMB, CKMBINDEX, TROPONINI in the last 168 hours. BNP (last 3 results) No results for input(s): PROBNP in the last 8760 hours. HbA1C: No results for input(s): HGBA1C in the last 72 hours. CBG: Recent Labs  Lab 04/10/19 0001 04/10/19 0415 04/10/19 0743 04/10/19 1019 04/10/19 1209  GLUCAP 273* 128* 500* 304* 325*   Lipid Profile: No results for input(s): CHOL, HDL, LDLCALC, TRIG, CHOLHDL, LDLDIRECT in the last 72 hours. Thyroid Function Tests: No results for input(s): TSH, T4TOTAL, FREET4, T3FREE, THYROIDAB in the last 72 hours. Anemia Panel: No results for input(s): VITAMINB12, FOLATE, FERRITIN, TIBC, IRON, RETICCTPCT in the last 72 hours. Urine analysis:    Component Value Date/Time   COLORURINE YELLOW 03/29/2019 1602   APPEARANCEUR HAZY (A) 03/29/2019 1602   LABSPEC 1.018 03/29/2019 1602   PHURINE 6.0 03/29/2019 1602   GLUCOSEU >=500 (A) 03/29/2019 1602   GLUCOSEU >=1000 11/07/2012 1205   HGBUR LARGE (A) 03/29/2019 1602   BILIRUBINUR NEGATIVE 03/29/2019 1602   KETONESUR 80 (A) 03/29/2019 1602   PROTEINUR 30 (A) 03/29/2019 1602   UROBILINOGEN 0.2 11/24/2014 1045   NITRITE NEGATIVE 03/29/2019 1602   LEUKOCYTESUR  LARGE (A) 03/29/2019 1602   No results found for this or any previous visit (from the past 240 hour(s)).    Radiology Studies: No results found.  Scheduled Meds: . Chlorhexidine Gluconate Cloth  6 each Topical Daily  . insulin aspart  0-9 Units Subcutaneous Q4H  . insulin aspart  2 Units Subcutaneous Q4H  . insulin glargine  16 Units Subcutaneous Daily  . metoCLOPramide (REGLAN) injection  10 mg Intravenous Q6H  . mirtazapine  15 mg Per Tube QHS  . pantoprazole (PROTONIX) IV  40 mg Intravenous Q12H  . rivaroxaban  10 mg Oral Daily  . sennosides  5 mL Oral BID  . sodium chloride flush  10-40 mL Intracatheter Q12H   Continuous Infusions: . erythromycin 250 mg (04/10/19 1226)  . feeding supplement (OSMOLITE 1.2 CAL) 55 mL/hr at 04/08/19 1000     LOS: 25 days   Time spent: 25 minutes.  Patrecia Pour, MD [facility] [email or web address] 04/10/2019, 2:13 PM

## 2019-04-10 NOTE — Progress Notes (Signed)
Nutrition Follow-up  DOCUMENTATION CODES:   Severe malnutrition in context of chronic illness  INTERVENTION:  Continue Osmolite 1.2 @ 55 ml/hr (1320 ml/day)  This regimen is providing 1584 kcal, (100% of estimated needs), 73 grams of protein, and 1082 ml free water  Will continue to monitor CBGs   NUTRITION DIAGNOSIS:   Severe Malnutrition related to chronic illness(diabetic gastroparesis) as evidenced by energy intake < or equal to 75% for > or equal to 1 month, percent weight loss, moderate fat depletion, severe fat depletion, moderate muscle depletion, severe muscle depletion, edema.  Ongoing  GOAL:   Patient will meet greater than or equal to 90% of their needs  Met via tube feedings  MONITOR:   PO intake, Labs, Weight trends, I & O's, TF tolerance  REASON FOR ASSESSMENT:   Consult Enteral/tube feeding initiation and management  ASSESSMENT:  29 year old female with past medical history significant for T1DM on insulin pump, recurrent DKA, gastroparesis, history of multiple hospitalizations due to intractable cyclic vomiting, HTN, GERD, asthma, presented with worsening nausea and vomiting.  3/18 postpyloric tube successfully adjusted, trickle feeds initiated  Per notes: -continue non-narcotic medications as much as possible -continue Reglan -worsening hyperglycemia with tube feeds at goal; Lantus to 16 units and add 2 u q4h novolog to SSI  Patient sitting up in bed at RD visit this afternoon. She reports tolerating tube feedings. Patient endorses intermittent nausea, but overall improving, denies episodes emesis, recalls having a loose stool yesterday.   Medications reviewed and include: SSI, Lantus, Reglan, Remeron, Xarelto IVPB: Erythromycin Labs: CBGs 305,325,304,500, K 5.3 (H), P 5.6 (H), Mg 1.8 (WNL)   Diet Order:   Diet Order    None      EDUCATION NEEDS:   Not appropriate for education at this time  Skin:  Skin Assessment: Reviewed RN  Assessment  Last BM:  3/21 per pt report type 7  Height:   Ht Readings from Last 1 Encounters:  03/16/19 _0  (1.626 m)    Weight:   Wt Readings from Last 1 Encounters:  04/01/19 47.5 kg    Ideal Body Weight:  54.5 kg  BMI:  Body mass index is 17.97 kg/m.  Estimated Nutritional Needs:   Kcal:  1500-1700  Protein:  70-80  Fluid:  >/= 1.5 L/day   Lajuan Lines, RD, LDN Clinical Nutrition After Hours/Weekend Pager # in [place]

## 2019-04-10 NOTE — TOC Progression Note (Signed)
Transition of Care [facility]) - Progression Note    Patient Details  Name: [name] MRN: [id number] Date of Birth: 11-10-1990  Transition of Care [facility]) CM/SW Contact  Cole Eastridge, Marjie Skiff, RN Phone Number: 04/10/2019, 10:44 AM  Clinical Narrative:    TOC continues to follow for DC needs. Pt still not medically stable for dc   Expected Discharge Plan: Home/Self Care Barriers to Discharge: Continued Medical Work up  Expected Discharge Plan and Services Expected Discharge Plan: Home/Self Care       Living arrangements for the past 2 months: Single Family Home Expected Discharge Date: (unknown)                                     Social Determinants of Health (SDOH) Interventions    Readmission Risk Interventions Readmission Risk Prevention Plan 03/24/2019 03/03/2019 02/23/2019  Transportation Screening Complete Complete Complete  Medication Review (RN Care Manager) Complete Complete Complete  PCP or Specialist appointment within 3-5 days of discharge Complete Complete Not Complete  PCP/Specialist Appt Not Complete comments - Patient next appointment is scheduled for 2/16. got earliest apointment avaliable which is 2/16-which is 9 business days from d/c date  [place] or [place] Complete Complete Complete  HRI or Home Care Consult Pt Refusal Comments - - -  SW Recovery Care/Counseling Consult Complete Complete Complete  SW Consult Not Complete Comments - - -  Palliative Care Screening Not Applicable Not Applicable Not Applicable  Comments - - -  [place] Not Applicable Not Applicable Not Applicable  Some recent data might be hidden

## 2019-04-11 LAB — COMPREHENSIVE METABOLIC PANEL
ALT: 9 U/L (ref 0–44)
AST: 14 U/L — ABNORMAL LOW (ref 15–41)
Albumin: 3 g/dL — ABNORMAL LOW (ref 3.5–5.0)
Alkaline Phosphatase: 72 U/L (ref 38–126)
Anion gap: 7 (ref 5–15)
BUN: 17 mg/dL (ref 6–20)
CO2: 30 mmol/L (ref 22–32)
Calcium: 8.5 mg/dL — ABNORMAL LOW (ref 8.9–10.3)
Chloride: 97 mmol/L — ABNORMAL LOW (ref 98–111)
Creatinine, Ser: 0.61 mg/dL (ref 0.44–1.00)
GFR calc Af Amer: >60 mL/min (ref >60)
GFR calc non Af Amer: >60 mL/min (ref >60)
Glucose, Bld: 287 mg/dL — ABNORMAL HIGH (ref 70–99)
Potassium: 4.4 mmol/L (ref 3.5–5.1)
Sodium: 134 mmol/L — ABNORMAL LOW (ref 135–145)
Total Bilirubin: 0.3 mg/dL (ref 0.3–1.2)
Total Protein: 5.9 g/dL — ABNORMAL LOW (ref 6.5–8.1)

## 2019-04-11 LAB — GLUCOSE, CAPILLARY
Glucose-Capillary: 137 mg/dL — ABNORMAL HIGH (ref 70–99)
Glucose-Capillary: 173 mg/dL — ABNORMAL HIGH (ref 70–99)
Glucose-Capillary: 200 mg/dL — ABNORMAL HIGH (ref 70–99)
Glucose-Capillary: 277 mg/dL — ABNORMAL HIGH (ref 70–99)
Glucose-Capillary: 311 mg/dL — ABNORMAL HIGH (ref 70–99)
Glucose-Capillary: 365 mg/dL — ABNORMAL HIGH (ref 70–99)
Glucose-Capillary: 404 mg/dL — ABNORMAL HIGH (ref 70–99)
Glucose-Capillary: 428 mg/dL — ABNORMAL HIGH (ref 70–99)
Glucose-Capillary: 433 mg/dL — ABNORMAL HIGH (ref 70–99)
Glucose-Capillary: 66 mg/dL — ABNORMAL LOW (ref 70–99)

## 2019-04-11 LAB — MAGNESIUM: Magnesium: 1.7 mg/dL (ref 1.7–2.4)

## 2019-04-11 LAB — PHOSPHORUS: Phosphorus: 5.3 mg/dL — ABNORMAL HIGH (ref 2.5–4.6)

## 2019-04-11 MED ORDER — INSULIN ASPART 100 UNIT/ML ~~LOC~~ SOLN
8.0000 [IU] | Freq: Once | SUBCUTANEOUS | Status: AC
  Administered 2019-04-11: 8 [IU] via SUBCUTANEOUS

## 2019-04-11 MED ORDER — INSULIN GLARGINE 100 UNIT/ML ~~LOC~~ SOLN
20.0000 [IU] | Freq: Every day | SUBCUTANEOUS | Status: DC
  Administered 2019-04-11 – 2019-04-12 (×2): 20 [IU] via SUBCUTANEOUS
  Filled 2019-04-11 (×2): qty 0.2

## 2019-04-11 MED ORDER — INSULIN ASPART 100 UNIT/ML ~~LOC~~ SOLN
3.0000 [IU] | SUBCUTANEOUS | Status: DC
  Administered 2019-04-11: 3 [IU] via SUBCUTANEOUS

## 2019-04-11 MED ORDER — INSULIN ASPART 100 UNIT/ML ~~LOC~~ SOLN
10.0000 [IU] | Freq: Once | SUBCUTANEOUS | Status: AC
  Administered 2019-04-12: 10 [IU] via SUBCUTANEOUS

## 2019-04-11 MED ORDER — SENNOSIDES 8.8 MG/5ML PO SYRP
5.0000 mL | ORAL_SOLUTION | Freq: Two times a day (BID) | ORAL | Status: DC | PRN
  Filled 2019-04-11: qty 5

## 2019-04-11 MED ORDER — PHENOL 1.4 % MT LIQD
1.0000 | OROMUCOSAL | Status: DC | PRN
  Filled 2019-04-11: qty 177

## 2019-04-11 MED ORDER — INSULIN ASPART 100 UNIT/ML ~~LOC~~ SOLN
10.0000 [IU] | Freq: Once | SUBCUTANEOUS | Status: AC
  Administered 2019-04-11: 10 [IU] via SUBCUTANEOUS

## 2019-04-11 NOTE — Progress Notes (Signed)
Inpatient Diabetes Program Recommendations  AACE/ADA: New Consensus Statement on Inpatient Glycemic Control (2015)  Target Ranges:  Prepandial:   less than 140 mg/dL      Peak postprandial:   less than 180 mg/dL (1-2 hours)      Critically ill patients:  140 - 180 mg/dL   Lab Results  Component Value Date   GLUCAP 365 (H) 04/11/2019   HGBA1C 7.8 (H) 02/18/2019    Review of Glycemic Control  Blood sugars this am - 200, 287, 365 mg/dL Lantus increased from 16 to 20 units QD  Inpatient Diabetes Program Recommendations:     Increase Novolog to 3 units Q4H for TF coverage.  Continue to follow.  Thank you. Lorenda Peck, RD, LDN, CDE Inpatient Diabetes Coordinator [phone]

## 2019-04-11 NOTE — Progress Notes (Addendum)
PROGRESS NOTE  [name]  [id number] DOB: 01-31-1990 DOA: 03/16/2019 PCP: Vicenta Aly, FNP   Brief Narrative: [name] is a 29 y.o. female with a history of T1DM on insulin pump, diabetic gastroparesis, pancreatitis, cyclic vomiting syndrome, HTN, asthma, and GERD who presented with intractable nausea and vomiting with abdominal pain which has failed to improve with supportive treatments including IV fluids, antiemetics, and tapering narcotic pain medications. NJT placement was attempted but initially unsuccessful in achieving postpyloric positioning. On 2nd reattempt, this was advanced into small bowel and tube feedings begun. Pain remains severe and associated with nausea, vomiting.  Assessment & Plan: Active Problems:   Diabetic gastroparesis (HCC)   Intractable nausea and vomiting   Diabetes mellitus (HCC)  Gastroparesis, intractable nausea and vomiting: Recurrent admissions for same. Severe gastroparesis as evidenced by gastric scintigraphy study in June 2018 showing 2% emptied at 1 hr (normal >= 10%) and only 24% emptied at 4 hr (normal >= 90%). Endoscopy showed retain food stomach, no anatomical cause for nausea, vomiting.  - Minimize narcotics.  - Continue with Reglan. Started erythromycin IV 3/13.  - GI consulted but signed off 3/16. NGT placed 3/16 and subsequent upper GI series showed no cause for ongoing symptoms. - Postpyloric feeding tube successfully adjusted 3/18, started tube feeds, continue at 55cc/hr. - Stopped bowel regimen with loose stools after initiating tube feeding.  Abnormal ECG, Tachycardia, ST elevation: No chest pain, no pericardial effusion on echocardiogram to suggest pericarditis or WMA.   - Follow up with cardiology as an outpatient.  Abdominal pain: Secondary to gastroparesis.  Also concern for opioid induced pain.  - Continue non-narcotic medications as much as possible.   Severe protein calorie malnutrition:  - Tube feeds  started 3/18, at goal as of 3/20. Dietitian recommendations are appreciated. - Monitor phos and electrolytes daily to check for refeeding syndrome. Doing well.  T1DM: With hypoglycemia, worsening hyperglycemia once tube feeds at goal.  - DC'ed insulin pump. - Increase lantus 16 > 20u today, increase to 3u q4h novolog to SSI.  Low grade fever: Resolved.  Hypokalemia: Due to poor intake.  - Continue monitoring. Tx w/insulin.   Hyperchloremia:  - Resolved.  Hypomagnesemia:  - Continue checking and supplement as needed  Iron deficiency anemia and AOCD: Exacerbated by ongoing malnutrition. B12 was normal. - Continue iron supplementation once able to give po.  Constipation: Ongoing issue resolved with tube feeds.   Neurogenic bladder with urine retention: Chronically, confirmed on bladder scan 3/22.  - Continue q8h self-catheterization to avoid UTI.   Pyuria: Urine culture negative   CoNS bacteremia: Cx from port with repeat NGTD s/p treatment with vancomycin x5 days.   DVT prophylaxis: Xarelto (pt declined lovenox) Code Status: Full Family Communication: None. Spoke with mother again by phone today. Disposition Plan: Continues to require IV fluids, tube feeding and IV medications. Hopeful to return to home environment once intractable vomiting improved but unknown when that will be.  Consultants:   GI  IR  Procedures:  EGD 3/12: Impression:       - LA Grade A erosive esophagitis with no bleeding.                           - A large amount of food (residue) in the stomach.                           - Normal examined duodenum.  Biopsied.                           - Biopsies were taken with a cold forceps for                            histology in the gastric antrum.                           - The overall picture is consistent with diabetic                            gastroparesis. No anatomic cause for nausea and                             vomiting  seen.  Antimicrobials:  Erythromycin 3/13 >>   Vancomycin 3/10 - 3/15  Subjective: Pain remains constant, severe, aching but sharp. Improved with dilaudid, currently improved. No nausea or vomiting.  Objective: Vitals:   04/10/19 2200 04/11/19 0155 04/11/19 0557 04/11/19 1004  BP: 106/72 96/65 113/77 102/70  Pulse: 96 86 (!) 106 96  Resp: 16 16 14 19   Temp: 98.6 F (37 C) 98.3 F (36.8 C) 98.7 F (37.1 C) 98.7 F (37.1 C)  TempSrc: Oral Oral Oral Oral  SpO2: 97% 97% 97% 98%  Weight:      Height:        Intake/Output Summary (Last 24 hours) at 04/11/2019 1027 Last data filed at 04/11/2019 0402 Gross per 24 hour  Intake --  Output 2200 ml  Net -2200 ml   Filed Weights   03/30/19 0700 03/31/19 0603 04/01/19 0628  Weight: 48 kg 48.4 kg 47.5 kg   Gen: 29 y.o. female in no distress Pulm: Nonlabored breathing room air. Clear. CV: Regular rate and rhythm. No murmur, rub, or gallop. No JVD, no dependent edema. GI: Abdomen soft, not significantly tender, non-distended, with normoactive bowel sounds.  Ext: Warm, no deformities Skin: No rashes, lesions or ulcers on visualized skin. Neuro: Alert and oriented. No focal neurological deficits. Psych: Judgement and insight appear fair. Mood euthymic & affect congruent. Behavior is appropriate.    Data Reviewed: I have personally reviewed following labs and imaging studies  CBC: Recent Labs  Lab 04/05/19 0520 04/09/19 0516 04/10/19 0603  WBC 5.1 4.7 4.9  HGB 8.8* 9.3* 9.8*  HCT 28.1* 30.1* 31.6*  MCV 87.8 88.3 87.8  PLT 326 377 [id number]*   Basic Metabolic Panel: Recent Labs  Lab 04/07/19 0551 04/08/19 0634 04/09/19 0516 04/10/19 0603 04/11/19 0540  NA 140 139 138 136 134*  K 4.2 4.2 4.5 5.3* 4.4  CL 112* 104 103 98 97*  CO2 24 28 30 31 30   GLUCOSE 129* 141* 152* 299* 287*  BUN <5* <5* 8 11 17   CREATININE 0.40* 0.45 0.43* 0.55 0.61  CALCIUM 8.2* 8.9 8.5* 8.8* 8.5*  MG 1.8 1.7 1.8 1.8 1.7  PHOS 3.4 3.1 4.5 5.6*  5.3*   GFR: Estimated Creatinine Clearance: 77.8 mL/min (by C-G formula based on SCr of 0.61 mg/dL). Liver Function Tests: Recent Labs  Lab 04/07/19 0551 04/08/19 0634 04/09/19 0516 04/10/19 0603 04/11/19 0540  AST 15 18 14* 14* 14*  ALT 8 12 9 10 9   ALKPHOS 57 72 61 67 72  BILITOT 0.6 0.4  0.3 0.6 0.3  PROT 5.7* 6.5 5.3* 5.9* 5.9*  ALBUMIN 2.9* 3.4* 2.8* 3.2* 3.0*   No results for input(s): LIPASE, AMYLASE in the last 168 hours. No results for input(s): AMMONIA in the last 168 hours. Coagulation Profile: No results for input(s): INR, PROTIME in the last 168 hours. Cardiac Enzymes: No results for input(s): CKTOTAL, CKMB, CKMBINDEX, TROPONINI in the last 168 hours. BNP (last 3 results) No results for input(s): PROBNP in the last 8760 hours. HbA1C: No results for input(s): HGBA1C in the last 72 hours. CBG: Recent Labs  Lab 04/10/19 2033 04/11/19 0000 04/11/19 0002 04/11/19 0343 04/11/19 0747  GLUCAP 277* 433* 428* 200* 365*   Lipid Profile: No results for input(s): CHOL, HDL, LDLCALC, TRIG, CHOLHDL, LDLDIRECT in the last 72 hours. Thyroid Function Tests: No results for input(s): TSH, T4TOTAL, FREET4, T3FREE, THYROIDAB in the last 72 hours. Anemia Panel: No results for input(s): VITAMINB12, FOLATE, FERRITIN, TIBC, IRON, RETICCTPCT in the last 72 hours. Urine analysis:    Component Value Date/Time   COLORURINE YELLOW 03/29/2019 1602   APPEARANCEUR HAZY (A) 03/29/2019 1602   LABSPEC 1.018 03/29/2019 1602   PHURINE 6.0 03/29/2019 1602   GLUCOSEU >=500 (A) 03/29/2019 1602   GLUCOSEU >=1000 11/07/2012 1205   HGBUR LARGE (A) 03/29/2019 1602   BILIRUBINUR NEGATIVE 03/29/2019 1602   KETONESUR 80 (A) 03/29/2019 1602   PROTEINUR 30 (A) 03/29/2019 1602   UROBILINOGEN 0.2 11/24/2014 1045   NITRITE NEGATIVE 03/29/2019 1602   LEUKOCYTESUR LARGE (A) 03/29/2019 1602   No results found for this or any previous visit (from the past 240 hour(s)).    Radiology Studies: No  results found.  Scheduled Meds: . Chlorhexidine Gluconate Cloth  6 each Topical Daily  . insulin aspart  0-9 Units Subcutaneous Q4H  . insulin aspart  2 Units Subcutaneous Q4H  . insulin glargine  20 Units Subcutaneous Daily  . metoCLOPramide (REGLAN) injection  10 mg Intravenous Q6H  . mirtazapine  15 mg Per Tube QHS  . pantoprazole (PROTONIX) IV  40 mg Intravenous Q12H  . rivaroxaban  10 mg Oral Daily  . sennosides  5 mL Oral BID  . sodium chloride flush  10-40 mL Intracatheter Q12H   Continuous Infusions: . erythromycin 250 mg (04/11/19 0402)  . feeding supplement (OSMOLITE 1.2 CAL) 1,000 mL (04/10/19 1619)     LOS: 26 days   Time spent: 25 minutes.  Patrecia Pour, MD [facility] [email or web address] 04/11/2019, 10:27 AM

## 2019-04-11 NOTE — Progress Notes (Addendum)
CRITICAL VALUE ALERT  Critical Value: CBG 404, recheck 458, CBG 2339 is 501.  Date & Time Notied:  04/11/19 2059, 04/11/19 @2339  for 2nd event  Provider Notified:Kirby, NP, floor coverage  Orders Received/Actions taken: 10u admin

## 2019-04-11 NOTE — Progress Notes (Signed)
Hypoglycemic Event  CBG: 66  Treatment: D50 50 mL (25 gm)  Symptoms: Hungry  Follow-up CBG: Time:1636 CBG Result:173  Possible Reasons for Event: Unknown  Comments/MD notified:MD made aware    Wilkie Aye Dionne

## 2019-04-12 LAB — PHOSPHORUS: Phosphorus: 5.2 mg/dL — ABNORMAL HIGH (ref 2.5–4.6)

## 2019-04-12 LAB — COMPREHENSIVE METABOLIC PANEL
ALT: 9 U/L (ref 0–44)
AST: 13 U/L — ABNORMAL LOW (ref 15–41)
Albumin: 3.2 g/dL — ABNORMAL LOW (ref 3.5–5.0)
Alkaline Phosphatase: 62 U/L (ref 38–126)
Anion gap: 10 (ref 5–15)
BUN: 22 mg/dL — ABNORMAL HIGH (ref 6–20)
CO2: 28 mmol/L (ref 22–32)
Calcium: 9 mg/dL (ref 8.9–10.3)
Chloride: 100 mmol/L (ref 98–111)
Creatinine, Ser: 0.58 mg/dL (ref 0.44–1.00)
GFR calc Af Amer: >60 mL/min (ref >60)
GFR calc non Af Amer: >60 mL/min (ref >60)
Glucose, Bld: 232 mg/dL — ABNORMAL HIGH (ref 70–99)
Potassium: 4.4 mmol/L (ref 3.5–5.1)
Sodium: 138 mmol/L (ref 135–145)
Total Bilirubin: 0.2 mg/dL — ABNORMAL LOW (ref 0.3–1.2)
Total Protein: 6.2 g/dL — ABNORMAL LOW (ref 6.5–8.1)

## 2019-04-12 LAB — GLUCOSE, CAPILLARY
Glucose-Capillary: 108 mg/dL — ABNORMAL HIGH (ref 70–99)
Glucose-Capillary: 181 mg/dL — ABNORMAL HIGH (ref 70–99)
Glucose-Capillary: 250 mg/dL — ABNORMAL HIGH (ref 70–99)
Glucose-Capillary: 348 mg/dL — ABNORMAL HIGH (ref 70–99)
Glucose-Capillary: 416 mg/dL — ABNORMAL HIGH (ref 70–99)
Glucose-Capillary: 458 mg/dL — ABNORMAL HIGH (ref 70–99)
Glucose-Capillary: 467 mg/dL — ABNORMAL HIGH (ref 70–99)
Glucose-Capillary: 501 mg/dL (ref 70–99)

## 2019-04-12 LAB — MAGNESIUM: Magnesium: 1.9 mg/dL (ref 1.7–2.4)

## 2019-04-12 MED ORDER — INSULIN GLARGINE 100 UNIT/ML ~~LOC~~ SOLN
25.0000 [IU] | Freq: Every day | SUBCUTANEOUS | Status: DC
  Filled 2019-04-12: qty 0.25

## 2019-04-12 MED ORDER — HYDROMORPHONE HCL 1 MG/ML IJ SOLN
0.5000 mg | Freq: Every day | INTRAMUSCULAR | Status: DC | PRN
  Administered 2019-04-13: 0.5 mg via INTRAVENOUS
  Filled 2019-04-12: qty 0.5

## 2019-04-12 MED ORDER — INSULIN ASPART 100 UNIT/ML ~~LOC~~ SOLN
22.0000 [IU] | Freq: Once | SUBCUTANEOUS | Status: AC
  Administered 2019-04-12: 22 [IU] via SUBCUTANEOUS

## 2019-04-12 MED ORDER — INSULIN ASPART 100 UNIT/ML ~~LOC~~ SOLN
3.0000 [IU] | SUBCUTANEOUS | Status: DC
  Administered 2019-04-12 – 2019-04-15 (×17): 3 [IU] via SUBCUTANEOUS

## 2019-04-12 MED ORDER — PROMETHAZINE HCL 25 MG/ML IJ SOLN
25.0000 mg | INTRAMUSCULAR | Status: DC | PRN
  Administered 2019-04-13 – 2019-04-15 (×9): 25 mg via INTRAVENOUS
  Filled 2019-04-12 (×9): qty 1

## 2019-04-12 NOTE — Progress Notes (Signed)
CBG 467. MD notified. Orders received

## 2019-04-12 NOTE — Progress Notes (Signed)
Subjective: Called by hospitalist to revisit given patient's ongoing nausea. Reports ongoing, but somewhat improved, nausea.  Objective: Vital signs in last 24 hours: Temp:  [98.4 F (36.9 C)-98.8 F (37.1 C)] 98.5 F (36.9 C) (03/24 0501) Pulse Rate:  [91-101] 91 (03/24 0501) Resp:  [16-18] 16 (03/24 0501) BP: (103-116)/(67-82) 103/78 (03/24 0501) SpO2:  [98 %-99 %] 99 % (03/24 0501) Weight change:  Last BM Date: 04/10/19  PE: GEN:  Noticeably thinner since last time I saw her (at least few months ago) HEENT:  Nasoenteric feeding tube in place (55 mL/hr) ABD:  Soft, mild periumbilical tenderness, no peritonitis  Lab Results: CBC    Component Value Date/Time   WBC 4.9 04/10/2019 0603   RBC 3.60 (L) 04/10/2019 0603   HGB 9.8 (L) 04/10/2019 0603   HCT 31.6 (L) 04/10/2019 0603   PLT 401 (H) 04/10/2019 0603   MCV 87.8 04/10/2019 0603   MCH 27.2 04/10/2019 0603   MCHC 31.0 04/10/2019 0603   RDW 17.0 (H) 04/10/2019 0603   LYMPHSABS 1.5 03/16/2019 0959   MONOABS 0.8 03/16/2019 0959   EOSABS 0.1 03/16/2019 0959   BASOSABS 0.0 03/16/2019 0959   CMP     Component Value Date/Time   NA 138 04/12/2019 0550   K 4.4 04/12/2019 0550   CL 100 04/12/2019 0550   CO2 28 04/12/2019 0550   GLUCOSE 232 (H) 04/12/2019 0550   BUN 22 (H) 04/12/2019 0550   CREATININE 0.58 04/12/2019 0550   CALCIUM 9.0 04/12/2019 0550   PROT 6.2 (L) 04/12/2019 0550   ALBUMIN 3.2 (L) 04/12/2019 0550   AST 13 (L) 04/12/2019 0550   ALT 9 04/12/2019 0550   ALKPHOS 62 04/12/2019 0550   BILITOT 0.2 (L) 04/12/2019 0550   GFRNONAA >60 04/12/2019 0550   GFRAA >60 04/12/2019 0550   Assessment:  1.  Diabetic gastroparesis.   2.  Diabetes, poor control.  Patient reports frequent values mid-to-high 200s during this hospitalization. 3.  Feeding difficulties, post-pyloric nasoenteric feeding tube in place.  Plan:  1.  Consistent improvement of blood sugar in 100s range will help patient's nausea. 2.  My  chart review shows administration of 2-4 total (PO + IV) doses of narcotics daily; decreasing use of narcotics will help patient's nausea. 3.  Continue metoclopramide and erythromycin. 4.  Mobilize patient (OOBTC and ambulate halls as tolerated) will help patient's nausea. 5.  Continue nasoenteric feeds (do not decrease rate), and do not advance peroral diet at this time. 6.  As outpatient, could consider to [facility] (Dr. Derrill Kay there is gastroparesis expert) for consideration of other testing and therapies.  However, unless/until blood sugar is more consistently in low to mid 100s and unless/until patient does not consume nearly as many narcotics, I doubt there would be consideration of further testing or work-up. 7.  [facility] will follow along at a distance.  Thank you for the consultation.   Landry Dyke 04/12/2019, 11:40 AM   Cell [phone] If no answer or after 5 PM call [phone]

## 2019-04-12 NOTE — Progress Notes (Signed)
[name], female   DOB: [birth year], 29 y.o.   MRN: [id number]  PROGRESS NOTE    [name]  [id number] DOB: [birth year] DOA: 03/16/2019 PCP: Vicenta Aly, FNP   Brief Narrative:  29 year old female with history of type 1 diabetes mellitus on insulin pump, diabetic gastroparesis, pancreatitis, cyclic vomiting syndrome, hypertension, asthma and GERD presented with intractable nausea and vomiting with abdominal pain which has failed to improve with supportive treatments including IV fluids, antiemetics and tapering narcotic pain medications.  GI was consulted.  Patient has been started on postpyloric tube feeding.  Assessment & Plan:   Gastritis, intractable nausea and vomiting: Recurrent admissions for the same Abdominal pain -Severe gastroparesis as evidenced by gastric scintigraphy study in June 2018 showing 2% emptied at 1 hr (normal >= 10%) and only 24% emptied at 4 hr (normal >= 90%). Endoscopy showed retain food stomach, no anatomical cause for nausea, vomiting.  -Minimize narcotics.  IV narcotics was being tapered off but patient complained of severe abdominal pain and currently is on Dilaudid IV twice a day as needed.  Will change it to once a day as needed and subsequently stop it.  There is a concern for opiate-induced pain. -GI consulted but had signed off on 04/04/2019. -Postpyloric feeding tube successfully adjusted on 04/06/2019, started tube feedings, continue at 55 cc an hour -Continue Reglan and as needed Zofran.  Minimize the use of Phenergan -Still complains of intermittent nausea.  Unsure about feeding plan.  Will start clear liquid diet.  Reconsult GI.  Abnormal EKG, tachycardia, ST elevation -No chest pain, no pericardial effusion on echo to suggest pericarditis or wall motion abnormality Follow-up with cardiology as an outpatient  Severe protein calorie malnutrition -Tube feeds started on 04/06/2019.  Follow dietary  recommendations  Diabetes mellitus type 1 with hypoglycemia and hyperglycemia -Currently off insulin pump -Now having intermittent hypoglycemia and hypoglycemia.  Increase Lantus to 25 units daily.  Continue CBGs with SSI.  Add NovoLog 3 units every 4 hours  Low-grade fever -Resolved  Iron deficiency anemia/anemia of chronic disease -Exacerbated by ongoing malnutrition.  Continue iron supplementation once able to give p.o.  Constipation -Ongoing issue resolved with tube feeds  Neurogenic bladder with urinary tension -Continue every 8 self-catheterization to avoid UTI  Pyuria -Urine culture negative.  Monitor off antibiotics  Coagulase-negative staph bacteremia -Culture from port with repeat cultures negative so far status post treatment with vancomycin x5 days   DVT prophylaxis: Xarelto Code Status: Full Family Communication: Spoke to patient at bedside Disposition Plan: Remain inpatient till able to start oral feeding.  Consultants: GI/IR  Procedures:  EGD 3/12: Impression: - LA Grade A erosive esophagitis with no bleeding. - A large amount of food (residue) in the stomach. - Normal examined duodenum. Biopsied. - Biopsies were taken with a cold forceps for  histology in the gastric antrum. - The overall picture is consistent with diabetic  gastroparesis. No anatomic cause for nausea and   vomiting seen.  Antimicrobials:   Erythromycin 3/13 >>   Vancomycin 3/10 - 3/15   Subjective: Patient seen and examined at bedside. Still complains of intermittent nausea. No vomiting. No overnight fever, chest pains.  Objective: Vitals:   04/11/19 1004 04/11/19 1351 04/11/19 2031 04/12/19 0501  BP: 102/70 115/82 116/67 103/78  Pulse: 96 91 (!) 101 91  Resp: 19 18 16 16   Temp: 98.7 F  (37.1 C) 98.4 F (36.9 C) 98.8 F (37.1 C) 98.5 F (36.9 C)  TempSrc: Oral Oral  Oral Oral  SpO2: 98% 98% 99% 99%  Weight:      Height:        Intake/Output Summary (Last 24 hours) at 04/12/2019 1255 Last data filed at 04/12/2019 0105 Gross per 24 hour  Intake --  Output 1900 ml  Net -1900 ml   Filed Weights   03/30/19 0700 03/31/19 0603 04/01/19 0628  Weight: 48 kg 48.4 kg 47.5 kg    Examination:  General exam: Appears calm and comfortable. Poor historian. Looks chronically ill. ENT: NG tube present Respiratory system: Bilateral decreased breath sounds at bases with scattered crackles Cardiovascular system: S1 & S2 heard, Rate controlled Gastrointestinal system: Abdomen is nondistended, soft and nontender. Normal bowel sounds heard. Extremities: No cyanosis, clubbing, edema  Central nervous system: Alert and oriented. Poor historian. No focal neurological deficits. Moving extremities Skin: No rashes, lesions or ulcers Psychiatry: very flat affect     Data Reviewed: I have personally reviewed following labs and imaging studies  CBC: Recent Labs  Lab 04/09/19 0516 04/10/19 0603  WBC 4.7 4.9  HGB 9.3* 9.8*  HCT 30.1* 31.6*  MCV 88.3 87.8  PLT 377 123XX123*   Basic Metabolic Panel: Recent Labs  Lab 04/08/19 0634 04/09/19 0516 04/10/19 0603 04/11/19 0540 04/12/19 0550  NA 139 138 136 134* 138  K 4.2 4.5 5.3* 4.4 4.4  CL 104 103 98 97* 100  CO2 28 30 31 30 28   GLUCOSE 141* 152* 299* 287* 232*  BUN <5* 8 11 17  22*  CREATININE 0.45 0.43* 0.55 0.61 0.58  CALCIUM 8.9 8.5* 8.8* 8.5* 9.0  MG 1.7 1.8 1.8 1.7 1.9  PHOS 3.1 4.5 5.6* 5.3* 5.2*   GFR: Estimated Creatinine Clearance: 77.8 mL/min (by C-G formula based on SCr of 0.58 mg/dL). Liver Function Tests: Recent Labs  Lab 04/08/19 0634 04/09/19 0516 04/10/19 0603 04/11/19 0540 04/12/19 0550  AST 18 14* 14* 14* 13*  ALT 12 9 10 9 9   ALKPHOS 72 61 67 72 62  BILITOT 0.4 0.3 0.6 0.3 0.2*  PROT 6.5 5.3* 5.9*  5.9* 6.2*  ALBUMIN 3.4* 2.8* 3.2* 3.0* 3.2*   No results for input(s): LIPASE, AMYLASE in the last 168 hours. No results for input(s): AMMONIA in the last 168 hours. Coagulation Profile: No results for input(s): INR, PROTIME in the last 168 hours. Cardiac Enzymes: No results for input(s): CKTOTAL, CKMB, CKMBINDEX, TROPONINI in the last 168 hours. BNP (last 3 results) No results for input(s): PROBNP in the last 8760 hours. HbA1C: No results for input(s): HGBA1C in the last 72 hours. CBG: Recent Labs  Lab 04/11/19 2107 04/11/19 2331 04/12/19 0453 04/12/19 0722 04/12/19 1137  GLUCAP 458* 501* 181* 108* 467*   Lipid Profile: No results for input(s): CHOL, HDL, LDLCALC, TRIG, CHOLHDL, LDLDIRECT in the last 72 hours. Thyroid Function Tests: No results for input(s): TSH, T4TOTAL, FREET4, T3FREE, THYROIDAB in the last 72 hours. Anemia Panel: No results for input(s): VITAMINB12, FOLATE, FERRITIN, TIBC, IRON, RETICCTPCT in the last 72 hours. Sepsis Labs: No results for input(s): PROCALCITON, LATICACIDVEN in the last 168 hours.  No results found for this or any previous visit (from the past 240 hour(s)).       Radiology Studies: No results found.      Scheduled Meds: . Chlorhexidine Gluconate Cloth  6 each Topical Daily  . insulin aspart  0-9 Units Subcutaneous Q4H  . insulin glargine  20 Units Subcutaneous Daily  . metoCLOPramide (REGLAN) injection  10 mg Intravenous Q6H  .  mirtazapine  15 mg Per Tube QHS  . pantoprazole (PROTONIX) IV  40 mg Intravenous Q12H  . rivaroxaban  10 mg Oral Daily  . sodium chloride flush  10-40 mL Intracatheter Q12H   Continuous Infusions: . erythromycin 250 mg (04/12/19 1148)  . feeding supplement (OSMOLITE 1.2 CAL) 1,000 mL (04/10/19 1619)          Aline August, MD [facility] 04/12/2019, 12:55 PM

## 2019-04-13 LAB — COMPREHENSIVE METABOLIC PANEL
ALT: 9 U/L (ref 0–44)
AST: 14 U/L — ABNORMAL LOW (ref 15–41)
Albumin: 3.1 g/dL — ABNORMAL LOW (ref 3.5–5.0)
Alkaline Phosphatase: 62 U/L (ref 38–126)
Anion gap: 7 (ref 5–15)
BUN: 21 mg/dL — ABNORMAL HIGH (ref 6–20)
CO2: 29 mmol/L (ref 22–32)
Calcium: 8.7 mg/dL — ABNORMAL LOW (ref 8.9–10.3)
Chloride: 100 mmol/L (ref 98–111)
Creatinine, Ser: 0.57 mg/dL (ref 0.44–1.00)
GFR calc Af Amer: >60 mL/min (ref >60)
GFR calc non Af Amer: >60 mL/min (ref >60)
Glucose, Bld: 278 mg/dL — ABNORMAL HIGH (ref 70–99)
Potassium: 4.6 mmol/L (ref 3.5–5.1)
Sodium: 136 mmol/L (ref 135–145)
Total Bilirubin: 0.3 mg/dL (ref 0.3–1.2)
Total Protein: 5.8 g/dL — ABNORMAL LOW (ref 6.5–8.1)

## 2019-04-13 LAB — GLUCOSE, CAPILLARY
Glucose-Capillary: 247 mg/dL — ABNORMAL HIGH (ref 70–99)
Glucose-Capillary: 163 mg/dL — ABNORMAL HIGH (ref 70–99)
Glucose-Capillary: 390 mg/dL — ABNORMAL HIGH (ref 70–99)
Glucose-Capillary: 402 mg/dL — ABNORMAL HIGH (ref 70–99)
Glucose-Capillary: 232 mg/dL — ABNORMAL HIGH (ref 70–99)

## 2019-04-13 LAB — CBC WITH DIFFERENTIAL/PLATELET
Abs Immature Granulocytes: 0.01 10*3/uL (ref 0.00–0.07)
Basophils Absolute: 0.1 10*3/uL (ref 0.0–0.1)
Basophils Relative: 1 %
Eosinophils Absolute: 0.3 10*3/uL (ref 0.0–0.5)
Eosinophils Relative: 7 %
HCT: 28.9 % — ABNORMAL LOW (ref 36.0–46.0)
Hemoglobin: 8.8 g/dL — ABNORMAL LOW (ref 12.0–15.0)
Immature Granulocytes: 0 %
Lymphocytes Relative: 44 %
Lymphs Abs: 2.1 10*3/uL (ref 0.7–4.0)
MCH: 27.6 pg (ref 26.0–34.0)
MCHC: 30.4 g/dL (ref 30.0–36.0)
MCV: 90.6 fL (ref 80.0–100.0)
Monocytes Absolute: 0.4 10*3/uL (ref 0.1–1.0)
Monocytes Relative: 9 %
Neutro Abs: 1.8 10*3/uL (ref 1.7–7.7)
Neutrophils Relative %: 39 %
Platelets: 307 10*3/uL (ref 150–400)
RBC: 3.19 MIL/uL — ABNORMAL LOW (ref 3.87–5.11)
RDW: 17 % — ABNORMAL HIGH (ref 11.5–15.5)
WBC: 4.7 10*3/uL (ref 4.0–10.5)
nRBC: 0 % (ref 0.0–0.2)

## 2019-04-13 LAB — MAGNESIUM: Magnesium: 1.7 mg/dL (ref 1.7–2.4)

## 2019-04-13 LAB — PHOSPHORUS: Phosphorus: 4.2 mg/dL (ref 2.5–4.6)

## 2019-04-13 MED ORDER — TRAMADOL HCL 50 MG PO TABS
50.0000 mg | ORAL_TABLET | Freq: Four times a day (QID) | ORAL | Status: DC | PRN
  Administered 2019-04-13 – 2019-04-14 (×3): 50 mg via ORAL
  Filled 2019-04-13 (×3): qty 1

## 2019-04-13 MED ORDER — INSULIN GLARGINE 100 UNIT/ML ~~LOC~~ SOLN
30.0000 [IU] | Freq: Every day | SUBCUTANEOUS | Status: DC
  Administered 2019-04-13: 30 [IU] via SUBCUTANEOUS
  Filled 2019-04-13 (×2): qty 0.3

## 2019-04-13 MED ORDER — GABAPENTIN 100 MG PO CAPS
100.0000 mg | ORAL_CAPSULE | Freq: Three times a day (TID) | ORAL | Status: DC
  Administered 2019-04-13 – 2019-04-17 (×13): 100 mg via ORAL
  Filled 2019-04-13 (×13): qty 1

## 2019-04-13 NOTE — Progress Notes (Signed)
[name], female   DOB: Aug 03, 1990, 29 y.o.   MRN: [id number]  PROGRESS NOTE    Kinzlee Roes  [id number] DOB: 11/18/1990 DOA: 03/16/2019 PCP: Vicenta Aly, FNP   Brief Narrative:  29 year old female with history of type 1 diabetes mellitus on insulin pump, diabetic gastroparesis, pancreatitis, cyclic vomiting syndrome, hypertension, asthma and GERD presented with intractable nausea and vomiting with abdominal pain which has failed to improve with supportive treatments including IV fluids, antiemetics and tapering narcotic pain medications.  GI was consulted.  Patient has been started on postpyloric tube feeding.  Assessment & Plan:   Gastroparesis/intractable nausea and vomiting: Recurrent admissions for the same Abdominal pain -Severe gastroparesis as evidenced by gastric scintigraphy study in June 2018 showing 2% emptied at 1 hr (normal >= 10%) and only 24% emptied at 4 hr (normal >= 90%). Endoscopy showed retain food stomach, no anatomical cause for nausea, vomiting.  -Minimize narcotics.  IV narcotics was being tapered off but patient complained of severe abdominal pain and was put back on IV Dilaudid.  IV Dilaudid has been switched to once a day as needed; will try and stop it and try to stop the oral opiate as well.  There is a concern for opiate-induced pain.  We will try oral tramadol as needed and start gabapentin 100 mg 3 times a day. -GI consulted but had signed off on 04/04/2019. -Postpyloric feeding tube successfully adjusted on 04/06/2019, started tube feedings, continue at 55 cc an hour -Continue Reglan and as needed Zofran.  Minimize the use of Phenergan -Still complains of intermittent nausea. Reconsulted GI on 04/12/2019. -Currently on full liquid diet.  Abnormal EKG, tachycardia, ST elevation -No chest pain, no pericardial effusion on echo to suggest pericarditis or wall motion abnormality Follow-up with cardiology as an outpatient  Severe  protein calorie malnutrition -Tube feeds started on 04/06/2019.  Follow dietary recommendations  Diabetes mellitus type 1 with hypoglycemia and hyperglycemia -Currently off insulin pump -Now having intermittent hypoglycemia and hypoglycemia.  -Increase Lantus to 30 units daily.  Continue CBGs with SSI.  Continue NovoLog 3 units every 4 hours  Low-grade fever -Resolved  Iron deficiency anemia/anemia of chronic disease -Exacerbated by ongoing malnutrition.  Continue iron supplementation once able to give p.o.  Constipation -Ongoing issue resolved with tube feeds  Neurogenic bladder with urinary tension -Continue every 8 self-catheterization to avoid UTI  Pyuria -Urine culture negative.  Monitor off antibiotics  Coagulase-negative staph bacteremia -Culture from port with repeat cultures negative so far status post treatment with vancomycin x5 days   DVT prophylaxis: Xarelto Code Status: Full Family Communication: Spoke to patient at bedside Disposition Plan: Remain inpatient till able to start oral feeding.  Consultants: GI/IR  Procedures:  EGD 3/12: Impression: - LA Grade A erosive esophagitis with no bleeding. - A large amount of food (residue) in the stomach. - Normal examined duodenum. Biopsied. - Biopsies were taken with a cold forceps for  histology in the gastric antrum. - The overall picture is consistent with diabetic  gastroparesis. No anatomic cause for nausea and   vomiting seen.  Antimicrobials:   Erythromycin 3/13 >>   Vancomycin 3/10 - 3/15   Subjective: Patient seen and examined at bedside.  Poor historian.  Complains of intermittent pains and intermittent nausea.  No overnight fever or vomiting. Objective: Vitals:   04/12/19 0501 04/12/19 1345 04/12/19 2122  04/13/19 0635  BP: 103/78 100/71 101/70 114/69  Pulse: 91 100 100 85  Resp: 16 18 16  16  Temp: 98.5 F (36.9 C) 99.1 F (37.3 C) 98.9 F (37.2 C) 98.4 F (36.9 C)  TempSrc: Oral Oral Oral Oral  SpO2: 99% 95% 97% 100%  Weight:      Height:        Intake/Output Summary (Last 24 hours) at 04/13/2019 0735 Last data filed at 04/13/2019 0616 Gross per 24 hour  Intake 1239.34 ml  Output 2650 ml  Net -1410.66 ml   Filed Weights   03/30/19 0700 03/31/19 0603 04/01/19 0628  Weight: 48 kg 48.4 kg 47.5 kg    Examination:  General exam: Appears calm and comfortable.  No acute distress.  Very poor historian.  Looks chronically ill. ENT: Still has NG tube Respiratory system: Bilateral decreased breath sounds at bases with some crackles.  No wheezing  cardiovascular system: Rate controlled, S1-S2 heard Gastrointestinal system: Abdomen is nondistended, soft and nontender.  Bowel sounds are heard  extremities: No clubbing or edema Central nervous system: Awake and alert.  Poor historian. No focal neurological deficits. Moving extremities Skin: No lesions, rashes or ulcers Psychiatry: Has extremely flat affect    Data Reviewed: I have personally reviewed following labs and imaging studies  CBC: Recent Labs  Lab 04/09/19 0516 04/10/19 0603 04/13/19 0405  WBC 4.7 4.9 4.7  NEUTROABS  --   --  1.8  HGB 9.3* 9.8* 8.8*  HCT 30.1* 31.6* 28.9*  MCV 88.3 87.8 90.6  PLT 377 401* [id number]   Basic Metabolic Panel: Recent Labs  Lab 04/09/19 0516 04/10/19 0603 04/11/19 0540 04/12/19 0550 04/13/19 0405  NA 138 136 134* 138 136  K 4.5 5.3* 4.4 4.4 4.6  CL 103 98 97* 100 100  CO2 30 31 30 28 29   GLUCOSE 152* 299* 287* 232* 278*  BUN 8 11 17  22* 21*  CREATININE 0.43* 0.55 0.61 0.58 0.57  CALCIUM 8.5* 8.8* 8.5* 9.0 8.7*  MG 1.8 1.8 1.7 1.9 1.7  PHOS 4.5 5.6* 5.3* 5.2* 4.2   GFR: Estimated Creatinine Clearance: 77.8 mL/min (by C-G formula based on SCr of 0.57 mg/dL). Liver Function  Tests: Recent Labs  Lab 04/09/19 0516 04/10/19 0603 04/11/19 0540 04/12/19 0550 04/13/19 0405  AST 14* 14* 14* 13* 14*  ALT 9 10 9 9 9   ALKPHOS 61 67 72 62 62  BILITOT 0.3 0.6 0.3 0.2* 0.3  PROT 5.3* 5.9* 5.9* 6.2* 5.8*  ALBUMIN 2.8* 3.2* 3.0* 3.2* 3.1*   No results for input(s): LIPASE, AMYLASE in the last 168 hours. No results for input(s): AMMONIA in the last 168 hours. Coagulation Profile: No results for input(s): INR, PROTIME in the last 168 hours. Cardiac Enzymes: No results for input(s): CKTOTAL, CKMB, CKMBINDEX, TROPONINI in the last 168 hours. BNP (last 3 results) No results for input(s): PROBNP in the last 8760 hours. HbA1C: No results for input(s): HGBA1C in the last 72 hours. CBG: Recent Labs  Lab 04/12/19 1137 04/12/19 1658 04/12/19 2100 04/12/19 2355 04/13/19 0339  GLUCAP 467* 416* 250* 348* 247*   Lipid Profile: No results for input(s): CHOL, HDL, LDLCALC, TRIG, CHOLHDL, LDLDIRECT in the last 72 hours. Thyroid Function Tests: No results for input(s): TSH, T4TOTAL, FREET4, T3FREE, THYROIDAB in the last 72 hours. Anemia Panel: No results for input(s): VITAMINB12, FOLATE, FERRITIN, TIBC, IRON, RETICCTPCT in the last 72 hours. Sepsis Labs: No results for input(s): PROCALCITON, LATICACIDVEN in the last 168 hours.  No results found for this or any previous visit (from the past 240 hour(s)).       Radiology  Studies: No results found.      Scheduled Meds: . Chlorhexidine Gluconate Cloth  6 each Topical Daily  . insulin aspart  0-9 Units Subcutaneous Q4H  . insulin aspart  3 Units Subcutaneous Q4H  . insulin glargine  25 Units Subcutaneous Daily  . metoCLOPramide (REGLAN) injection  10 mg Intravenous Q6H  . mirtazapine  15 mg Per Tube QHS  . pantoprazole (PROTONIX) IV  40 mg Intravenous Q12H  . rivaroxaban  10 mg Oral Daily  . sodium chloride flush  10-40 mL Intracatheter Q12H   Continuous Infusions: . erythromycin Stopped (04/13/19 0503)  .  feeding supplement (OSMOLITE 1.2 CAL) 1,000 mL (04/13/19 0630)          Aline August, MD [facility] 04/13/2019, 7:35 AM

## 2019-04-14 LAB — BASIC METABOLIC PANEL
Anion gap: 7 (ref 5–15)
BUN: 18 mg/dL (ref 6–20)
CO2: 28 mmol/L (ref 22–32)
Calcium: 8.5 mg/dL — ABNORMAL LOW (ref 8.9–10.3)
Chloride: 102 mmol/L (ref 98–111)
Creatinine, Ser: 0.56 mg/dL (ref 0.44–1.00)
GFR calc Af Amer: >60 mL/min (ref >60)
GFR calc non Af Amer: >60 mL/min (ref >60)
Glucose, Bld: 286 mg/dL — ABNORMAL HIGH (ref 70–99)
Potassium: 4.3 mmol/L (ref 3.5–5.1)
Sodium: 137 mmol/L (ref 135–145)

## 2019-04-14 LAB — GLUCOSE, CAPILLARY
Glucose-Capillary: 213 mg/dL — ABNORMAL HIGH (ref 70–99)
Glucose-Capillary: 102 mg/dL — ABNORMAL HIGH (ref 70–99)
Glucose-Capillary: 309 mg/dL — ABNORMAL HIGH (ref 70–99)
Glucose-Capillary: 176 mg/dL — ABNORMAL HIGH (ref 70–99)
Glucose-Capillary: 477 mg/dL — ABNORMAL HIGH (ref 70–99)
Glucose-Capillary: 455 mg/dL — ABNORMAL HIGH (ref 70–99)
Glucose-Capillary: 240 mg/dL — ABNORMAL HIGH (ref 70–99)

## 2019-04-14 LAB — MAGNESIUM: Magnesium: 1.7 mg/dL (ref 1.7–2.4)

## 2019-04-14 MED ORDER — INSULIN ASPART 100 UNIT/ML ~~LOC~~ SOLN
15.0000 [IU] | Freq: Once | SUBCUTANEOUS | Status: AC
  Administered 2019-04-14: 15 [IU] via SUBCUTANEOUS

## 2019-04-14 MED ORDER — INSULIN ASPART 100 UNIT/ML ~~LOC~~ SOLN
0.0000 [IU] | SUBCUTANEOUS | Status: DC
  Administered 2019-04-14: 5 [IU] via SUBCUTANEOUS
  Administered 2019-04-15: 3 [IU] via SUBCUTANEOUS
  Administered 2019-04-15: 2 [IU] via SUBCUTANEOUS
  Administered 2019-04-15: 3 [IU] via SUBCUTANEOUS
  Administered 2019-04-15: 2 [IU] via SUBCUTANEOUS
  Administered 2019-04-16: 5 [IU] via SUBCUTANEOUS

## 2019-04-14 MED ORDER — INSULIN GLARGINE 100 UNIT/ML ~~LOC~~ SOLN
35.0000 [IU] | Freq: Every day | SUBCUTANEOUS | Status: DC
  Administered 2019-04-14 – 2019-04-15 (×2): 35 [IU] via SUBCUTANEOUS
  Filled 2019-04-14 (×3): qty 0.35

## 2019-04-14 NOTE — Progress Notes (Signed)
Nutrition Follow-up  DOCUMENTATION CODES:   Severe malnutrition in context of chronic illness  INTERVENTION:  Continue Osmolite 1.2 @ 55 ml/hr (1320 ml/day)  This regimen is providing 1584 kcal, (100% of estimated needs), 73 grams of protein, and 1082 ml free water  Obtain new weight to assess trends  Continue to monitor CBGs  Diet advancement per GI   NUTRITION DIAGNOSIS:   Severe Malnutrition related to chronic illness(diabetic gastroparesis) as evidenced by energy intake < or equal to 75% for > or equal to 1 month, percent weight loss, moderate fat depletion, severe fat depletion, moderate muscle depletion, severe muscle depletion, edema.  Ongoing  GOAL:   Patient will meet greater than or equal to 90% of their needs  Met with tube feeding  MONITOR:   PO intake, Labs, Weight trends, I & O's, TF tolerance  REASON FOR ASSESSMENT:   Consult Enteral/tube feeding initiation and management  ASSESSMENT:  RD working remotely.  29 year old female with past medical history significant for T1DM on insulin pump, recurrent DKA, gastroparesis, history of multiple hospitalizations due to intractable cyclic vomiting, HTN, GERD, asthma, presented with worsening nausea and vomiting.   2/25 Admit 3/12 EGD - LA Grade A erosive esophagitis with no bleeding, large amount of food residue in stomach consistent with diabetic gastroparesis  3/18 postpyloric tube successfully adjusted, trickle feeds initiated 3/20 Tube feeding at goal 3/25 IV Dilaudid and oral opiate stopped  Per notes, patient reporting intermittent pain and nausea, no worsening fever, vomiting, or shortness of breath.   Per GI recommendations on 3/24 - continue with tube feedings at current rate  - do not advance peroral diet at this time - decrease use of narcotics - patient mobilization (OOBTC and ambulate halls as tolerated) - with consistent improvement of blood sugar in 100s would expect improvements to  nausea.   Patient is on a full liquid diet, per flowsheets she has eaten 0% of meals from 3/24-3/25.   No new weights since 3/13, will order new weight to assess trends.   Medications reviewed and include: Gabapentin, SSI, Lantus, Remeron, Protonix, Xarelto IVPB erythromycin  Labs: CBGs 176,309,102,213   Diet Order:   Diet Order            Diet full liquid Room service appropriate? Yes; Fluid consistency: Thin  Diet effective now              EDUCATION NEEDS:   Not appropriate for education at this time  Skin:  Skin Assessment: Reviewed RN Assessment  Last BM:  3/26 type 4  Height:   Ht Readings from Last 1 Encounters:  03/16/19 5' 4"  (1.626 m)    Weight:   Wt Readings from Last 1 Encounters:  04/01/19 47.5 kg    Ideal Body Weight:  54.5 kg  BMI:  Body mass index is 17.97 kg/m.  Estimated Nutritional Needs:   Kcal:  1500-1700  Protein:  70-80  Fluid:  >/= 1.5 L/day   Lajuan Lines, RD, LDN Clinical Nutrition After Hours/Weekend Pager # in [phone]

## 2019-04-14 NOTE — Progress Notes (Signed)
[name], female   DOB: May 03, 1990, 29 y.o.   MRN: [id number]  PROGRESS NOTE    [name]  [id number] DOB: 04-11-1990 DOA: 03/16/2019 PCP: Vicenta Aly, FNP   Brief Narrative:  29 year old female with history of type 1 diabetes mellitus on insulin pump, diabetic gastroparesis, pancreatitis, cyclic vomiting syndrome, hypertension, asthma and GERD presented with intractable nausea and vomiting with abdominal pain which has failed to improve with supportive treatments including IV fluids, antiemetics and tapering narcotic pain medications.  GI was consulted.  Patient has been started on postpyloric tube feeding.  Assessment & Plan:   Gastroparesis/intractable nausea and vomiting: Recurrent admissions for the same Abdominal pain -Severe gastroparesis as evidenced by gastric scintigraphy study in June 2018 showing 2% emptied at 1 hr (normal >= 10%) and only 24% emptied at 4 hr (normal >= 90%). Endoscopy showed retain food stomach, no anatomical cause for nausea, vomiting.  -Minimize narcotics.  IV narcotics was being tapered off but patient complained of severe abdominal pain and was put back on IV Dilaudid.  IV Dilaudid and oral opiate were stopped on 04/13/2019.  There is a concern for opiate-induced pain.  Continue gabapentin and as needed tramadol. -GI consulted but had signed off on 04/04/2019. -Postpyloric feeding tube successfully adjusted on 04/06/2019, started tube feedings, continue at 55 cc an hour -Continue Reglan and as needed Zofran.  Minimize the use of Phenergan -Still complains of intermittent nausea. Reconsulted GI on 04/12/2019 and is following at a distance.  We will try and follow-up with GI regarding timing of NG tube removal and advancement of diet. -Currently on full liquid diet.  Abnormal EKG, tachycardia, ST elevation -No chest pain, no pericardial effusion on echo to suggest pericarditis or wall motion abnormality Follow-up with cardiology  as an outpatient  Severe protein calorie malnutrition -Tube feeds started on 04/06/2019.  Follow dietary recommendations  Diabetes mellitus type 1 with hypoglycemia and hyperglycemia -Currently off insulin pump -Now having intermittent hypoglycemia and hypoglycemia.  -Increase Lantus to 35 units daily.  Continue CBGs with SSI.  Continue NovoLog 3 units every 4 hours  Low-grade fever -Resolved  Iron deficiency anemia/anemia of chronic disease -Exacerbated by ongoing malnutrition.  Continue iron supplementation once able to give p.o.  Constipation -Ongoing issue resolved with tube feeds  Neurogenic bladder with urinary tension -Continue every 8 self-catheterization to avoid UTI  Pyuria -Urine culture negative.  Monitor off antibiotics  Coagulase-negative staph bacteremia -Culture from port with repeat cultures negative so far status post treatment with vancomycin x5 days   DVT prophylaxis: Xarelto Code Status: Full Family Communication: Spoke to patient at bedside Disposition Plan: Remain inpatient till able to start oral feeding.  Consultants: GI/IR  Procedures:  EGD 3/12: Impression: - LA Grade A erosive esophagitis with no bleeding. - A large amount of food (residue) in the stomach. - Normal examined duodenum. Biopsied. - Biopsies were taken with a cold forceps for  histology in the gastric antrum. - The overall picture is consistent with diabetic  gastroparesis. No anatomic cause for nausea and   vomiting seen.  Antimicrobials:   Erythromycin 3/13 >>   Vancomycin 3/10 - 3/15   Subjective: Patient seen and examined at bedside.  Poor historian.  Still has intermittent pain in nausea.  No worsening fever, vomiting, shortness of breath chest pain.  Patient is wondering when the  NG tube can be removed. Objective: Vitals:   04/13/19 0635 04/13/19 1444 04/13/19 2038 04/14/19 0533  BP: 114/69 106/71 109/74 100/66  Pulse: 85 (!) 107 (!) 105 96  Resp: 16 18 18 19   Temp: 98.4 F (36.9 C) 98.7 F (37.1 C) 98.8 F (37.1 C) 98.2 F (36.8 C)  TempSrc: Oral Oral Oral Oral  SpO2: 100% 98% 100% 100%  Weight:      Height:        Intake/Output Summary (Last 24 hours) at 04/14/2019 0734 Last data filed at 04/13/2019 1945 Gross per 24 hour  Intake --  Output 800 ml  Net -800 ml   Filed Weights   03/30/19 0700 03/31/19 0603 04/01/19 0628  Weight: 48 kg 48.4 kg 47.5 kg    Examination:  General exam: Appears calm and comfortable.  No distress.  Very poor historian.  Looks chronically ill. ENT: NG tube present with ongoing feeding Respiratory system: Bilateral decreased breath sounds at bases with scattered crackles  cardiovascular system: S1-S2 heard, intermittently tachycardic Gastrointestinal system: Abdomen is nondistended, soft and nontender.  Normal bowel sounds heard  extremities: No cyanosis or clubbing Central nervous system: Alert and awake.  Extremity poor historian. No focal neurological deficits. Moving extremities Skin: No rashes or lesions Psychiatry: Very flat affect.  Does not communicate much    Data Reviewed: I have personally reviewed following labs and imaging studies  CBC: Recent Labs  Lab 04/09/19 0516 04/10/19 0603 04/13/19 0405  WBC 4.7 4.9 4.7  NEUTROABS  --   --  1.8  HGB 9.3* 9.8* 8.8*  HCT 30.1* 31.6* 28.9*  MCV 88.3 87.8 90.6  PLT 377 401* [id number]   Basic Metabolic Panel: Recent Labs  Lab 04/09/19 0516 04/09/19 0516 04/10/19 0603 04/11/19 0540 04/12/19 0550 04/13/19 0405 04/14/19 0613  NA 138   < > 136 134* 138 136 137  K 4.5   < > 5.3* 4.4 4.4 4.6 4.3  CL 103   < > 98 97* 100 100 102  CO2 30   < > 31 30 28 29 28   GLUCOSE 152*   < > 299* 287* 232* 278* 286*  BUN 8   < > 11 17 22* 21* 18  CREATININE 0.43*   < >  0.55 0.61 0.58 0.57 0.56  CALCIUM 8.5*   < > 8.8* 8.5* 9.0 8.7* 8.5*  MG 1.8   < > 1.8 1.7 1.9 1.7 1.7  PHOS 4.5  --  5.6* 5.3* 5.2* 4.2  --    < > = values in this interval not displayed.   GFR: Estimated Creatinine Clearance: 77.8 mL/min (by C-G formula based on SCr of 0.56 mg/dL). Liver Function Tests: Recent Labs  Lab 04/09/19 0516 04/10/19 0603 04/11/19 0540 04/12/19 0550 04/13/19 0405  AST 14* 14* 14* 13* 14*  ALT 9 10 9 9 9   ALKPHOS 61 67 72 62 62  BILITOT 0.3 0.6 0.3 0.2* 0.3  PROT 5.3* 5.9* 5.9* 6.2* 5.8*  ALBUMIN 2.8* 3.2* 3.0* 3.2* 3.1*   No results for input(s): LIPASE, AMYLASE in the last 168 hours. No results for input(s): AMMONIA in the last 168 hours. Coagulation Profile: No results for input(s): INR, PROTIME in the last 168 hours. Cardiac Enzymes: No results for input(s): CKTOTAL, CKMB, CKMBINDEX, TROPONINI in the last 168 hours. BNP (last 3 results) No results for input(s): PROBNP in the last 8760 hours. HbA1C: No results for input(s): HGBA1C in the last 72 hours. CBG: Recent Labs  Lab 04/13/19 1201 04/13/19 1653 04/13/19 2037 04/14/19 0100 04/14/19 0414  GLUCAP 390* 402* 232* 213* 102*   Lipid Profile: No results for  input(s): CHOL, HDL, LDLCALC, TRIG, CHOLHDL, LDLDIRECT in the last 72 hours. Thyroid Function Tests: No results for input(s): TSH, T4TOTAL, FREET4, T3FREE, THYROIDAB in the last 72 hours. Anemia Panel: No results for input(s): VITAMINB12, FOLATE, FERRITIN, TIBC, IRON, RETICCTPCT in the last 72 hours. Sepsis Labs: No results for input(s): PROCALCITON, LATICACIDVEN in the last 168 hours.  No results found for this or any previous visit (from the past 240 hour(s)).       Radiology Studies: No results found.      Scheduled Meds: . Chlorhexidine Gluconate Cloth  6 each Topical Daily  . gabapentin  100 mg Oral TID  . insulin aspart  0-9 Units Subcutaneous Q4H  . insulin aspart  3 Units Subcutaneous Q4H  . insulin glargine   30 Units Subcutaneous Daily  . metoCLOPramide (REGLAN) injection  10 mg Intravenous Q6H  . mirtazapine  15 mg Per Tube QHS  . pantoprazole (PROTONIX) IV  40 mg Intravenous Q12H  . rivaroxaban  10 mg Oral Daily  . sodium chloride flush  10-40 mL Intracatheter Q12H   Continuous Infusions: . erythromycin 250 mg (04/14/19 0505)  . feeding supplement (OSMOLITE 1.2 CAL) 1,000 mL (04/14/19 0114)          Aline August, MD [facility] 04/14/2019, 7:34 AM

## 2019-04-15 LAB — GLUCOSE, CAPILLARY
Glucose-Capillary: 129 mg/dL — ABNORMAL HIGH (ref 70–99)
Glucose-Capillary: 137 mg/dL — ABNORMAL HIGH (ref 70–99)
Glucose-Capillary: 137 mg/dL — ABNORMAL HIGH (ref 70–99)
Glucose-Capillary: 153 mg/dL — ABNORMAL HIGH (ref 70–99)
Glucose-Capillary: 160 mg/dL — ABNORMAL HIGH (ref 70–99)
Glucose-Capillary: 201 mg/dL — ABNORMAL HIGH (ref 70–99)
Glucose-Capillary: 230 mg/dL — ABNORMAL HIGH (ref 70–99)
Glucose-Capillary: 62 mg/dL — ABNORMAL LOW (ref 70–99)
Glucose-Capillary: 89 mg/dL (ref 70–99)

## 2019-04-15 LAB — MAGNESIUM: Magnesium: 1.8 mg/dL (ref 1.7–2.4)

## 2019-04-15 LAB — BASIC METABOLIC PANEL
Anion gap: 9 (ref 5–15)
BUN: 21 mg/dL — ABNORMAL HIGH (ref 6–20)
CO2: 29 mmol/L (ref 22–32)
Calcium: 9 mg/dL (ref 8.9–10.3)
Chloride: 105 mmol/L (ref 98–111)
Creatinine, Ser: 0.5 mg/dL (ref 0.44–1.00)
GFR calc Af Amer: >60 mL/min (ref >60)
GFR calc non Af Amer: >60 mL/min (ref >60)
Glucose, Bld: 94 mg/dL (ref 70–99)
Potassium: 4.3 mmol/L (ref 3.5–5.1)
Sodium: 143 mmol/L (ref 135–145)

## 2019-04-15 NOTE — Progress Notes (Signed)
[name], female   DOB: 03/15/1990, 29 y.o.   MRN: [id number]  PROGRESS NOTE    Shannell Maricle  [id number] DOB: 1990/10/24 DOA: 03/16/2019 PCP: Vicenta Aly, FNP   Brief Narrative:  29 year old female with history of type 1 diabetes mellitus on insulin pump, diabetic gastroparesis, pancreatitis, cyclic vomiting syndrome, hypertension, asthma and GERD presented with intractable nausea and vomiting with abdominal pain which has failed to improve with supportive treatments including IV fluids, antiemetics and tapering narcotic pain medications.  GI was consulted.  Patient has been started on postpyloric tube feeding.  Assessment & Plan:   Gastroparesis/intractable nausea and vomiting: Recurrent admissions for the same Abdominal pain -Severe gastroparesis as evidenced by gastric scintigraphy study in June 2018 showing 2% emptied at 1 hr (normal >= 10%) and only 24% emptied at 4 hr (normal >= 90%). Endoscopy showed retain food stomach, no anatomical cause for nausea, vomiting.  -Minimize narcotics.  IV narcotics was being tapered off but patient complained of severe abdominal pain and was put back on IV Dilaudid.  IV Dilaudid and oral opiate were stopped on 04/13/2019.  There is a concern for opiate-induced pain.  Continue gabapentin and as needed tramadol. -GI consulted but had signed off on 04/04/2019. -Postpyloric feeding tube successfully adjusted on 04/06/2019, started tube feedings, continue at 55 cc an hour -Continue Reglan and as needed Zofran.  Minimize the use of Phenergan.  Will DC IV Phenergan today. -Still complains of intermittent nausea. Reconsulted GI on 04/12/2019 and is following at a distance.  GI will probably follow-up with the patient today regarding timing of NG tube removal and advancement of diet. -Currently on full liquid diet.  Abnormal EKG, tachycardia, ST elevation -No chest pain, no pericardial effusion on echo to suggest pericarditis or wall  motion abnormality Follow-up with cardiology as an outpatient  Severe protein calorie malnutrition -Tube feeds started on 04/06/2019.  Follow dietary recommendations  Diabetes mellitus type 1 with hypoglycemia and hyperglycemia -Currently off insulin pump -Now having intermittent hypoglycemia and hypoglycemia.  -Had an episode of hypoglycemia with blood sugar of 62 earlier this morning. -Continue Lantus.  Continue CBGs with SSI.  Continue NovoLog 3 units every 4 hours  Low-grade fever -Resolved  Iron deficiency anemia/anemia of chronic disease -Exacerbated by ongoing malnutrition.  Continue iron supplementation once able to give p.o.  Constipation -Ongoing issue resolved with tube feeds  Neurogenic bladder with urinary tension -Continue every 8 self-catheterization to avoid UTI  Pyuria -Urine culture negative.  Monitor off antibiotics  Coagulase-negative staph bacteremia -Culture from port with repeat cultures negative so far status post treatment with vancomycin x5 days   DVT prophylaxis: Xarelto Code Status: Full Family Communication: Spoke to patient at bedside Disposition Plan: Remain inpatient till able to start oral feeding.  Probable home in 2 to 3 days once NG tube is removed and once cleared by GI.  Consultants: GI/IR  Procedures:  EGD 3/12: Impression: - LA Grade A erosive esophagitis with no bleeding. - A large amount of food (residue) in the stomach. - Normal examined duodenum. Biopsied. - Biopsies were taken with a cold forceps for  histology in the gastric antrum. - The overall picture is consistent with diabetic  gastroparesis. No anatomic cause for nausea and   vomiting seen.  Antimicrobials:   Erythromycin 3/13 >>   Vancomycin 3/10 - 3/15   Subjective:  Patient seen and examined at bedside.  Poor historian.  Still has intermittent nausea and requiring Reglan, Phenergan, Zofran.  No  overnight fever or vomiting.  No worsening shortness of breath.  Still asking when NG tube can be removed. Objective: Vitals:   04/14/19 0533 04/14/19 1521 04/14/19 2019 04/15/19 0624  BP: 100/66 128/83 103/68 109/72  Pulse: 96 (!) 103 (!) 104 94  Resp: 19 16 14 14   Temp: 98.2 F (36.8 C) 98.8 F (37.1 C) 98.6 F (37 C) 98.3 F (36.8 C)  TempSrc: Oral Oral Oral Oral  SpO2: 100% 100% 100% 100%  Weight:      Height:        Intake/Output Summary (Last 24 hours) at 04/15/2019 0750 Last data filed at 04/15/2019 0631 Gross per 24 hour  Intake -  Output 750 ml  Net -750 ml   Filed Weights   03/30/19 0700 03/31/19 0603 04/01/19 0628  Weight: 48 kg 48.4 kg 47.5 kg    Examination:  General exam: Appears calm and comfortable.  No acute distress.  Looks older than stated age..  Very poor historian.  Looks chronically ill. ENT: Still has NG tube with ongoing feeding Respiratory system: Bilateral decreased breath sounds at bases with some scattered crackles.  No wheezing  cardiovascular system: S1-S2 heard, tachycardic Gastrointestinal system: Abdomen is nondistended, soft and nontender.  Bowel sounds are heard  extremities: No cyanosis or clubbing.  Trace lower extremity edema present Central nervous system: Awake and alert.  Extremity poor historian.  No obvious focal deficits present  skin: No lesions, ulcers or rashes Psychiatry: Extremely flat affect.  Does not communicate much    Data Reviewed: I have personally reviewed following labs and imaging studies  CBC: Recent Labs  Lab 04/09/19 0516 04/10/19 0603 04/13/19 0405  WBC 4.7 4.9 4.7  NEUTROABS  --   --  1.8  HGB 9.3* 9.8* 8.8*  HCT 30.1* 31.6* 28.9*  MCV 88.3 87.8 90.6  PLT 377 401* [id number]   Basic Metabolic Panel: Recent Labs  Lab 04/09/19 0516 04/09/19 0516 04/10/19 0603 04/10/19  0603 04/11/19 0540 04/12/19 0550 04/13/19 0405 04/14/19 0613 04/15/19 0500  NA 138   < > 136   < > 134* 138 136 137 143  K 4.5   < > 5.3*   < > 4.4 4.4 4.6 4.3 4.3  CL 103   < > 98   < > 97* 100 100 102 105  CO2 30   < > 31   < > 30 28 29 28 29   GLUCOSE 152*   < > 299*   < > 287* 232* 278* 286* 94  BUN 8   < > 11   < > 17 22* 21* 18 21*  CREATININE 0.43*   < > 0.55   < > 0.61 0.58 0.57 0.56 0.50  CALCIUM 8.5*   < > 8.8*   < > 8.5* 9.0 8.7* 8.5* 9.0  MG 1.8   < > 1.8   < > 1.7 1.9 1.7 1.7 1.8  PHOS 4.5  --  5.6*  --  5.3* 5.2* 4.2  --   --    < > = values in this interval not displayed.   GFR: Estimated Creatinine Clearance: 77.8 mL/min (by C-G formula based on SCr of 0.5 mg/dL). Liver Function Tests: Recent Labs  Lab 04/09/19 0516 04/10/19 0603 04/11/19 0540 04/12/19 0550 04/13/19 0405  AST 14* 14* 14* 13* 14*  ALT 9 10 9 9 9   ALKPHOS 61 67 72 62 62  BILITOT 0.3 0.6 0.3 0.2* 0.3  PROT 5.3* 5.9* 5.9* 6.2* 5.8*  ALBUMIN 2.8* 3.2* 3.0* 3.2* 3.1*   No results for input(s): LIPASE, AMYLASE in the last 168 hours. No results for input(s): AMMONIA in the last 168 hours. Coagulation Profile: No results for input(s): INR, PROTIME in the last 168 hours. Cardiac Enzymes: No results for input(s): CKTOTAL, CKMB, CKMBINDEX, TROPONINI in the last 168 hours. BNP (last 3 results) No results for input(s): PROBNP in the last 8760 hours. HbA1C: No results for input(s): HGBA1C in the last 72 hours. CBG: Recent Labs  Lab 04/14/19 2017 04/15/19 0023 04/15/19 0416 04/15/19 0623 04/15/19 0738  GLUCAP 240* 89 62* 137* 137*   Lipid Profile: No results for input(s): CHOL, HDL, LDLCALC, TRIG, CHOLHDL, LDLDIRECT in the last 72 hours. Thyroid Function Tests: No results for input(s): TSH, T4TOTAL, FREET4, T3FREE, THYROIDAB in the last 72 hours. Anemia Panel: No results for input(s): VITAMINB12, FOLATE, FERRITIN, TIBC, IRON, RETICCTPCT in the last 72 hours. Sepsis Labs: No results for  input(s): PROCALCITON, LATICACIDVEN in the last 168 hours.  No results found for this or any previous visit (from the past 240 hour(s)).       Radiology Studies: No results found.      Scheduled Meds: . Chlorhexidine Gluconate Cloth  6 each Topical Daily  . gabapentin  100 mg Oral TID  . insulin aspart  0-15 Units Subcutaneous Q4H  . insulin aspart  3 Units Subcutaneous Q4H  . insulin glargine  35 Units Subcutaneous Daily  . metoCLOPramide (REGLAN) injection  10 mg Intravenous Q6H  . mirtazapine  15 mg Per Tube QHS  . pantoprazole (PROTONIX) IV  40 mg Intravenous Q12H  . rivaroxaban  10 mg Oral Daily  . sodium chloride flush  10-40 mL Intracatheter Q12H   Continuous Infusions: . erythromycin 250 mg (04/15/19 0456)  . feeding supplement (OSMOLITE 1.2 CAL) 1,000 mL (04/14/19 1842)          Aline August, MD [facility] 04/15/2019, 7:50 AM

## 2019-04-16 ENCOUNTER — Inpatient Hospital Stay (HOSPITAL_COMMUNITY): Payer: BC Managed Care – PPO

## 2019-04-16 LAB — BASIC METABOLIC PANEL
Anion gap: 8 (ref 5–15)
BUN: 14 mg/dL (ref 6–20)
CO2: 25 mmol/L (ref 22–32)
Calcium: 8.3 mg/dL — ABNORMAL LOW (ref 8.9–10.3)
Chloride: 107 mmol/L (ref 98–111)
Creatinine, Ser: 0.49 mg/dL (ref 0.44–1.00)
GFR calc Af Amer: >60 mL/min (ref >60)
GFR calc non Af Amer: >60 mL/min (ref >60)
Glucose, Bld: 112 mg/dL — ABNORMAL HIGH (ref 70–99)
Potassium: 3.9 mmol/L (ref 3.5–5.1)
Sodium: 140 mmol/L (ref 135–145)

## 2019-04-16 LAB — GLUCOSE, CAPILLARY
Glucose-Capillary: 110 mg/dL — ABNORMAL HIGH (ref 70–99)
Glucose-Capillary: 124 mg/dL — ABNORMAL HIGH (ref 70–99)
Glucose-Capillary: 161 mg/dL — ABNORMAL HIGH (ref 70–99)
Glucose-Capillary: 166 mg/dL — ABNORMAL HIGH (ref 70–99)
Glucose-Capillary: 187 mg/dL — ABNORMAL HIGH (ref 70–99)
Glucose-Capillary: 23 mg/dL — CL (ref 70–99)
Glucose-Capillary: 235 mg/dL — ABNORMAL HIGH (ref 70–99)
Glucose-Capillary: 264 mg/dL — ABNORMAL HIGH (ref 70–99)
Glucose-Capillary: 50 mg/dL — ABNORMAL LOW (ref 70–99)
Glucose-Capillary: 75 mg/dL (ref 70–99)
Glucose-Capillary: 90 mg/dL (ref 70–99)

## 2019-04-16 LAB — MAGNESIUM: Magnesium: 1.6 mg/dL — ABNORMAL LOW (ref 1.7–2.4)

## 2019-04-16 MED ORDER — DEXTROSE-NACL 5-0.45 % IV SOLN: INTRAVENOUS | Status: DC

## 2019-04-16 MED ORDER — INSULIN ASPART 100 UNIT/ML ~~LOC~~ SOLN: 0.0000 [IU] | Freq: Every day | SUBCUTANEOUS | Status: DC

## 2019-04-16 MED ORDER — PROMETHAZINE HCL 25 MG/ML IJ SOLN
25.0000 mg | Freq: Once | INTRAMUSCULAR | Status: AC
  Administered 2019-04-16: 25 mg via INTRAVENOUS
  Filled 2019-04-16: qty 1

## 2019-04-16 MED ORDER — INSULIN GLARGINE 100 UNIT/ML ~~LOC~~ SOLN
20.0000 [IU] | Freq: Every day | SUBCUTANEOUS | Status: DC
  Administered 2019-04-16 – 2019-04-17 (×2): 20 [IU] via SUBCUTANEOUS
  Filled 2019-04-16 (×2): qty 0.2

## 2019-04-16 MED ORDER — MAGNESIUM SULFATE 2 GM/50ML IV SOLN
2.0000 g | Freq: Once | INTRAVENOUS | Status: AC
  Administered 2019-04-16: 2 g via INTRAVENOUS
  Filled 2019-04-16: qty 50

## 2019-04-16 MED ORDER — INSULIN ASPART 100 UNIT/ML ~~LOC~~ SOLN
0.0000 [IU] | Freq: Three times a day (TID) | SUBCUTANEOUS | Status: DC
  Administered 2019-04-16: 2 [IU] via SUBCUTANEOUS
  Administered 2019-04-16 – 2019-04-17 (×2): 5 [IU] via SUBCUTANEOUS

## 2019-04-16 NOTE — Progress Notes (Addendum)
(  No Charge)  Biopsies from 03/31/19 egd came back neg for H. Pylori or other significant pathology.  (from brief review of progress notes, it wasn't clear if that had been noted)  Cleotis Nipper, M.D. Pager [phone] If no answer or after 5 PM call [phone]

## 2019-04-16 NOTE — Progress Notes (Signed)
Hypoglycemic event, blood sugar 50, pt. Asymptomatic. Protocol implemented, B, Kyere notified, BS will be reassessed in 15 minutes. No pt. Distress noted. Will continue to monitor pt closely. Neomia Dear, RN

## 2019-04-16 NOTE — Progress Notes (Signed)
[name], female   DOB: October 14, 1990, 29 y.o.   MRN: [id number]  PROGRESS NOTE    Alzora Eulberg  [id number] DOB: Apr 26, 1990 DOA: 03/16/2019 PCP: Vicenta Aly, FNP   Brief Narrative:  29 year old female with history of type 1 diabetes mellitus on insulin pump, diabetic gastroparesis, pancreatitis, cyclic vomiting syndrome, hypertension, asthma and GERD presented with intractable nausea and vomiting with abdominal pain which has failed to improve with supportive treatments including IV fluids, antiemetics and tapering narcotic pain medications.  GI was consulted.  Patient has been started on postpyloric tube feeding.  Assessment & Plan:   Gastroparesis/intractable nausea and vomiting: Recurrent admissions for the same Abdominal pain -Severe gastroparesis as evidenced by gastric scintigraphy study in June 2018 showing 2% emptied at 1 hr (normal >= 10%) and only 24% emptied at 4 hr (normal >= 90%). Endoscopy showed retain food stomach, no anatomical cause for nausea, vomiting.  -Minimize narcotics.  IV narcotics was being tapered off but patient complained of severe abdominal pain and was put back on IV Dilaudid.  IV Dilaudid and oral opiate were stopped on 04/13/2019.  There is a concern for opiate-induced pain.  Continue gabapentin and as needed tramadol. -GI consulted but had signed off on 04/04/2019. -Postpyloric feeding tube successfully adjusted on 04/06/2019, started tube feedings, continue at 55 cc an hour -Continue Reglan and as needed Zofran.  DC'd Phenergan on 04/15/2019 -Still complains of intermittent nausea. Reconsulted GI on 04/12/2019 and is following at a distance.  Follow further GI recommendations.  Patient NG tube has clogged overnight of 04/15/2019.  Patient does not want to have this fixed and wants it removed and wants to go home.  I have notified Dr. Wolfgang Phoenix regarding the same. -Currently on full liquid diet.  Will await further GI evaluation and  recommendations before advancing diet.  Hypomagnesemia-replace.  Repeat a.m. labs  Abnormal EKG, tachycardia, ST elevation -No chest pain, no pericardial effusion on echo to suggest pericarditis or wall motion abnormality Follow-up with cardiology as an outpatient  Severe protein calorie malnutrition -Tube feeds started on 04/06/2019.  Follow dietary recommendations  Diabetes mellitus type 1 with hypoglycemia and hyperglycemia -Currently off insulin pump -Now having intermittent hypoglycemia and hypoglycemia.  -Had episodes of hypoglycemia again last night. -Decrease dose of Lantus.  Continue CBGs with SSI.  DC NovoLog.  Low-grade fever -Resolved  Iron deficiency anemia/anemia of chronic disease -Exacerbated by ongoing malnutrition.  Continue iron supplementation once able to give p.o.  Constipation -Ongoing issue resolved with tube feeds  Neurogenic bladder with urinary tension -Continue every 8 self-catheterization to avoid UTI  Pyuria -Urine culture negative.  Monitor off antibiotics  Coagulase-negative staph bacteremia -Culture from port with repeat cultures negative so far status post treatment with vancomycin x5 days   DVT prophylaxis: Xarelto Code Status: Full Family Communication: Spoke to patient at bedside Disposition Plan: Remain inpatient till able to tolerate advanced feeding.  Will need further GI input prior to discharge.  Consultants: GI/IR  Procedures:  EGD 3/12: Impression: - LA Grade A erosive esophagitis with no bleeding. - A large amount of food (residue) in the stomach. - Normal examined duodenum. Biopsied. - Biopsies were taken with a cold forceps for  histology in the gastric antrum. - The overall picture is consistent with diabetic  gastroparesis. No anatomic cause for nausea and    vomiting seen.  Antimicrobials:   Erythromycin 3/13 >>   Vancomycin 3/10 - 3/15   Subjective: Patient seen and examined at bedside.  Poor historian.  Nursing staff reports that patient her NG tube has clogged overnight and is not working anymore.  Had episodes of hypoglycemia overnight.  No overnight fever.  Still complains of intermittent nausea.  Patient wants her NG tube removed and does not want NG feeding anymore.  She wants her diet advanced. Objective: Vitals:   04/15/19 0624 04/15/19 1344 04/15/19 2022 04/16/19 0512  BP: 109/72 117/73 107/70 114/77  Pulse: 94 (!) 107 96 93  Resp: 14 16 14 14   Temp: 98.3 F (36.8 C) 98.9 F (37.2 C) 98.7 F (37.1 C) 98.8 F (37.1 C)  TempSrc: Oral Oral Oral Oral  SpO2: 100% 97% 96% 99%  Weight:      Height:        Intake/Output Summary (Last 24 hours) at 04/16/2019 0750 Last data filed at 04/16/2019 0700 Gross per 24 hour  Intake 2090 ml  Output --  Net 2090 ml   Filed Weights   03/30/19 0700 03/31/19 0603 04/01/19 0628  Weight: 48 kg 48.4 kg 47.5 kg    Examination:  General exam: No distress.  Looks older than stated age.poor historian.  Looks chronically ill. ENT: NG tube currently still present.   Respiratory system: Bilateral decreased breath sounds at bases with some crackles  cardiovascular system: Rate controlled S1-S2 heard Gastrointestinal system: Abdomen is nondistended, soft and nontender.  Normal bowel sounds are heard extremities: Trace lower extremity edema present.  No clubbing Central nervous system: Alert and awake.  Extremity poor historian.  No focal deficits present.   Skin: No ulcers, lesions or rashes Psychiatry: Has flat affect.  Does not communicate much    Data Reviewed: I have personally reviewed following labs and imaging studies  CBC: Recent Labs  Lab 04/10/19 0603 04/13/19 0405  WBC 4.9 4.7  NEUTROABS  --  1.8  HGB 9.8* 8.8*  HCT 31.6* 28.9*  MCV 87.8  90.6  PLT 401* [id number]   Basic Metabolic Panel: Recent Labs  Lab 04/10/19 0603 04/10/19 0603 04/11/19 0540 04/11/19 0540 04/12/19 0550 04/13/19 0405 04/14/19 0613 04/15/19 0500 04/16/19 0320  NA 136   < > 134*   < > 138 136 137 143 140  K 5.3*   < > 4.4   < > 4.4 4.6 4.3 4.3 3.9  CL 98   < > 97*   < > 100 100 102 105 107  CO2 31   < > 30   < > 28 29 28 29 25   GLUCOSE 299*   < > 287*   < > 232* 278* 286* 94 112*  BUN 11   < > 17   < > 22* 21* 18 21* 14  CREATININE 0.55   < > 0.61   < > 0.58 0.57 0.56 0.50 0.49  CALCIUM 8.8*   < > 8.5*   < > 9.0 8.7* 8.5* 9.0 8.3*  MG 1.8   < > 1.7   < > 1.9 1.7 1.7 1.8 1.6*  PHOS 5.6*  --  5.3*  --  5.2* 4.2  --   --   --    < > = values in this interval not displayed.   GFR: Estimated Creatinine Clearance: 77.8 mL/min (by C-G formula based on SCr of 0.49 mg/dL). Liver Function Tests: Recent Labs  Lab 04/10/19 0603 04/11/19 0540 04/12/19 0550 04/13/19 0405  AST 14* 14* 13* 14*  ALT 10 9 9 9   ALKPHOS 67 72 62 62  BILITOT 0.6 0.3 0.2* 0.3  PROT 5.9* 5.9* 6.2* 5.8*  ALBUMIN 3.2* 3.0* 3.2* 3.1*   No results for input(s): LIPASE, AMYLASE in the last 168 hours. No results for input(s): AMMONIA in the last 168 hours. Coagulation Profile: No results for input(s): INR, PROTIME in the last 168 hours. Cardiac Enzymes: No results for input(s): CKTOTAL, CKMB, CKMBINDEX, TROPONINI in the last 168 hours. BNP (last 3 results) No results for input(s): PROBNP in the last 8760 hours. HbA1C: No results for input(s): HGBA1C in the last 72 hours. CBG: Recent Labs  Lab 04/16/19 0229 04/16/19 0251 04/16/19 0308 04/16/19 0359 04/16/19 0510  GLUCAP 50* 23* 110* 75 90   Lipid Profile: No results for input(s): CHOL, HDL, LDLCALC, TRIG, CHOLHDL, LDLDIRECT in the last 72 hours. Thyroid Function Tests: No results for input(s): TSH, T4TOTAL, FREET4, T3FREE, THYROIDAB in the last 72 hours. Anemia Panel: No results for input(s): VITAMINB12, FOLATE,  FERRITIN, TIBC, IRON, RETICCTPCT in the last 72 hours. Sepsis Labs: No results for input(s): PROCALCITON, LATICACIDVEN in the last 168 hours.  No results found for this or any previous visit (from the past 240 hour(s)).       Radiology Studies: DG Abd Portable 1V  Result Date: 04/16/2019 CLINICAL DATA:  Clogged feeding tube EXAM: PORTABLE ABDOMEN - 1 VIEW COMPARISON:  None. FINDINGS: Feeding tube is seen within the proximal stomach. The bowel gas pattern is normal. Surgical clips in the right upper quadrant. No radio-opaque calculi or other significant radiographic abnormality are seen. IMPRESSION: Feeding tube tip within the proximal stomach. Electronically Signed   By: Prudencio Pair M.D.   On: 04/16/2019 03:17        Scheduled Meds: . Chlorhexidine Gluconate Cloth  6 each Topical Daily  . gabapentin  100 mg Oral TID  . insulin aspart  0-15 Units Subcutaneous Q4H  . insulin aspart  3 Units Subcutaneous Q4H  . insulin glargine  20 Units Subcutaneous Daily  . metoCLOPramide (REGLAN) injection  10 mg Intravenous Q6H  . mirtazapine  15 mg Per Tube QHS  . pantoprazole (PROTONIX) IV  40 mg Intravenous Q12H  . rivaroxaban  10 mg Oral Daily  . sodium chloride flush  10-40 mL Intracatheter Q12H   Continuous Infusions: . dextrose 5 % and 0.45% NaCl 30 mL/hr at 04/16/19 0316  . erythromycin 250 mg (04/16/19 0438)  . feeding supplement (OSMOLITE 1.2 CAL) 1,000 mL (04/15/19 1436)  . magnesium sulfate bolus IVPB            Aline August, MD [facility] 04/16/2019, 7:50 AM

## 2019-04-16 NOTE — Progress Notes (Signed)
Feels good. No more nausea, no vomiting. Eating. Wants the tube out and wants to go home. Ok to send hoe on Reglan 10 mg po qid.

## 2019-04-16 NOTE — Progress Notes (Signed)
Patient ambulating in the hall several times today. She ate chicken salad for dinner only about 20% and became nauseous, medicated with Zofran.

## 2019-04-16 NOTE — Progress Notes (Signed)
Upon entering room, Postpyloric feeding tube was disconnected from pump, attempted to flush tube several times without any success. Pt. States she doesn't want another tube. Provider Notified, Lorra Hals NP, floor coverage. No new orders obtained at this time. Neomia Dear, RN

## 2019-04-17 LAB — GLUCOSE, CAPILLARY: Glucose-Capillary: 255 mg/dL — ABNORMAL HIGH (ref 70–99)

## 2019-04-17 LAB — BASIC METABOLIC PANEL
Anion gap: 8 (ref 5–15)
BUN: 11 mg/dL (ref 6–20)
CO2: 26 mmol/L (ref 22–32)
Calcium: 9.1 mg/dL (ref 8.9–10.3)
Chloride: 106 mmol/L (ref 98–111)
Creatinine, Ser: 0.56 mg/dL (ref 0.44–1.00)
GFR calc Af Amer: >60 mL/min (ref >60)
GFR calc non Af Amer: >60 mL/min (ref >60)
Glucose, Bld: 215 mg/dL — ABNORMAL HIGH (ref 70–99)
Potassium: 4 mmol/L (ref 3.5–5.1)
Sodium: 140 mmol/L (ref 135–145)

## 2019-04-17 LAB — MAGNESIUM: Magnesium: 1.8 mg/dL (ref 1.7–2.4)

## 2019-04-17 MED ORDER — HEPARIN SOD (PORK) LOCK FLUSH 100 UNIT/ML IV SOLN
500.0000 [IU] | Freq: Once | INTRAVENOUS | Status: AC
  Administered 2019-04-17: 500 [IU] via INTRAVENOUS
  Filled 2019-04-17: qty 5

## 2019-04-17 MED ORDER — POLYETHYLENE GLYCOL 3350 17 G PO PACK: 17.0000 g | PACK | Freq: Every day | ORAL | 0 refills | Status: AC | PRN

## 2019-04-17 MED ORDER — INSULIN GLARGINE 100 UNIT/ML SOLOSTAR PEN: 16.0000 [IU] | PEN_INJECTOR | Freq: Every day | SUBCUTANEOUS | 0 refills | Status: DC

## 2019-04-17 MED ORDER — INSULIN ASPART 100 UNIT/ML FLEXPEN: 4.0000 [IU] | PEN_INJECTOR | Freq: Three times a day (TID) | SUBCUTANEOUS | 0 refills | Status: DC

## 2019-04-17 MED ORDER — METOCLOPRAMIDE HCL 10 MG PO TABS: 10.0000 mg | ORAL_TABLET | Freq: Three times a day (TID) | ORAL | 0 refills | Status: DC

## 2019-04-17 MED ORDER — GABAPENTIN 100 MG PO CAPS: 100.0000 mg | ORAL_CAPSULE | Freq: Three times a day (TID) | ORAL | 0 refills | Status: DC

## 2019-04-17 MED ORDER — PANTOPRAZOLE SODIUM 40 MG PO TBEC: 40.0000 mg | DELAYED_RELEASE_TABLET | Freq: Every day | ORAL | 0 refills | Status: DC

## 2019-04-17 NOTE — Progress Notes (Signed)
Patient was given discharge instructions with no immediate questions or concerns. Patient will be transported via wheelchair for discharge.

## 2019-04-17 NOTE — Discharge Summary (Addendum)
Discharge Summary  [name] DOB: August 10, 1990  PCP: Vicenta Aly, FNP  Admit date: 03/16/2019 Discharge date: 04/17/2019  Time spent: 35 minutes  Recommendations for Outpatient Follow-up:  1. Follow-up with endocrinology in 1 week. 2. Follow-up with GI in 1 to 2 weeks 3. Follow-up with your PCP 4. Please obtain referral to cardiology from your PCP for an abnormal twelve-lead EKG. 5. Take your medications as prescribed  Discharge Diagnoses:  Active Hospital Problems   Diagnosis Date Noted  . Diabetes mellitus ([place]) 11/13/2018  . Intractable nausea and vomiting 05/28/2015  . Diabetic gastroparesis [facility])     Resolved Hospital Problems  No resolved problems to display.    Discharge Condition: Stable  Diet recommendation: Resume previous diet with small frequent meals.  Vitals:   04/16/19 2138 04/17/19 0524  BP: 126/86 116/82  Pulse: (!) 108 89  Resp: 16 16  Temp: 99 F (37.2 C) 97.8 F (36.6 C)  SpO2: 98% 98%    History of present illness:  29 year old female with history of type 1 diabetes mellitus on insulin pump, diabetic gastroparesis, pancreatitis, cyclic vomiting syndrome, hypertension, asthma and GERD presented with intractable nausea and vomiting with abdominal pain which has failed to improve with supportive treatments including IV fluids, antiemetics and tapering narcotic pain medications.  GI was consulted.  Patient received postpyloric tube feeding.  Had an EGD done on 03/31/19 by Dr. Cristina Gong which revealed: - LA Grade A erosive esophagitis with no bleeding. - A large amount of food (residue) in the stomach. - Normal examined duodenum. Biopsied. - Biopsies were taken with a cold forceps for histology in the gastric antrum. - The overall picture is consistent with diabetic gastroparesis. No anatomic cause for nausea and vomiting seen. Biopsies from 03/31/19 egd came back neg for H. Pylori or other significant pathology.  Postpyloric  tube feeding removed on 04/16/2019.  She has been able to tolerate oral intake.  She denies any nausea or abdominal pain.  Last bowel movement was yesterday.  She has no new complaints and wants to go home.  Vital signs and labs reviewed and are stable.  On the day of discharge, the patient was hemodynamically stable.  She will need to follow-up with her endocrinologist and GI posthospitalization.  Patient understands and agrees to plan.  Hospital Course:  Active Problems:   Diabetic gastroparesis ([facility])   Intractable nausea and vomiting   Diabetes mellitus ([place])  -Severe gastroparesis in the setting of poorly controlled type 1 diabetes with hyperglycemia. As evidenced by gastric scintigraphy study in June 2018 showing 2% emptied at 1 hr (normal >= 10%) and only 24% emptied at 4 hr (normal >= 90%). Endoscopy showed retain food stomach, no anatomical cause for nausea, vomiting.  -Minimize narcotics.  There is a concern for opiate-induced pain.  Continue gabapentin  -Per GI Dr. Penelope Coop on 04/16/2019: ok to send home on Reglan 10 mg po qid As outpatient, could consider to [facility] (Dr. Derrill Kay there is gastroparesis expert) for consideration of other testing and therapies.  -Postpyloric feeding tube successfully adjusted on 04/06/2019, started tube feedings, discontinued on 04/16/2019. -Tolerating oral feedings well. -Currently on carb modified diet. -Follow-up with GI and endocrinology outpatient.  LA grade A erosive esophagitis with no bleeding Continue PPI, Protonix 40 mg daily Biopsies came back negative for H. pylori Follow-up with GI  Resolved post repletion: Hypomagnesemia Magnesium 1.8  Abnormal EKG, tachycardia, ST elevation -No chest pain, no pericardial effusion on echo to suggest pericarditis or wall  motion abnormality Follow-up with cardiology as an outpatient. Obtain referral from your PCP. Denies any anginal symptoms.  Severe protein calorie malnutrition -Tube feeds  started on 04/06/2019>> 04/16/2019.   -Followed dietary recommendations -Recommend small frequent meals.  Diabetes mellitus type 1 with hypoglycemia and hyperglycemia -Currently off insulin pump -Continue Lantus 16 units daily and NovoLog 4 units 3 times daily. -Follow-up with your endocrinologist in 1 week.  Iron deficiency anemia/anemia of chronic disease -Exacerbated by ongoing malnutrition.   Continue iron supplementation   Constipation -Self-reported positive bowel movement yesterday. -Continue bowel regimen as needed  Resolved urinary retention -Passing urine on her own  Pyuria -Urine culture negative.   Coagulase-negative staph bacteremia -Culture from port with repeat cultures negative so far status post treatment with vancomycin x5 days   DVT prophylaxis: Xarelto   Consultants: GI  Procedures:  EGD 3/12: Impression: - LA Grade A erosive esophagitis with no bleeding. - A large amount of food (residue) in the stomach. - Normal examined duodenum. Biopsied. - Biopsies were taken with a cold forceps for  histology in the gastric antrum. - The overall picture is consistent with diabetic  gastroparesis. No anatomic cause for nausea and   vomiting seen.  Antimicrobials:   Erythromycin 3/13 >>3/19  Vancomycin 3/10 - 3/15     Discharge Exam: BP 116/82 (BP Location: Left Arm)   Pulse 89   Temp 97.8 F (36.6 C) (Oral)   Resp 16   Ht _0  (1.626 m)   Wt 47.5 kg   LMP 03/26/2019   SpO2 98%   BMI 17.97 kg/m  . General: 29 y.o. year-old female well developed well nourished in no acute distress.  Alert and oriented x3. . Cardiovascular: Regular rate and rhythm with no rubs or gallops.  No thyromegaly or JVD noted.   [name] Respiratory: Clear to auscultation with no  wheezes or rales. Good inspiratory effort. . Abdomen: Soft nontender nondistended with normal bowel sounds x4 quadrants. . Musculoskeletal: No lower extremity edema. 2/4 pulses in all 4 extremities. . Skin: No ulcerative lesions noted or rashes, . Psychiatry: Mood is appropriate for condition and setting  Discharge Instructions You were cared for by a hospitalist during your hospital stay. If you have any questions about your discharge medications or the care you received while you were in the hospital after you are discharged, you can call the unit and asked to speak with the hospitalist on call if the hospitalist that took care of you is not available. Once you are discharged, your primary care physician will handle any further medical issues. Please note that NO REFILLS for any discharge medications will be authorized once you are discharged, as it is imperative that you return to your primary care physician (or establish a relationship with a primary care physician if you do not have one) for your aftercare needs so that they can reassess your need for medications and monitor your lab values.   Allergies as of 04/17/2019      Reactions   Peanut-containing Drug Products Swelling, Other (See Comments)   Reaction:  Swelling of mouth and lips    Food Swelling, Other (See Comments)   Pt is allergic to strawberries.   Reaction:  Swelling of mouth and lips    Ultram [tramadol] Itching      Medication List    STOP taking these medications   insulin pump Soln   lisinopril 2.5 MG tablet Commonly known as: ZESTRIL     TAKE these medications  albuterol 108 (90 Base) MCG/ACT inhaler Commonly known as: VENTOLIN HFA Inhale 1-2 puffs into the lungs every 6 (six) hours as needed for wheezing or shortness of breath.   blood glucose meter kit and supplies Dispense based on patient and insurance preference. Use up to four times daily as directed. (FOR ICD-10 E10.9, E11.9).   ferrous sulfate 324  (65 Fe) MG Tbec Take 1 tablet by mouth every other day.   gabapentin 100 MG capsule Commonly known as: NEURONTIN Take 1 capsule (100 mg total) by mouth 3 (three) times daily.   glucagon 1 MG injection Inject 1 mg into the muscle once as needed (For very low blood sugars. May repeat in 15-20 minutes if needed.).   insulin aspart 100 UNIT/ML FlexPen Commonly known as: NOVOLOG Inject 4 Units into the skin 3 (three) times daily with meals.   insulin glargine 100 UNIT/ML Solostar Pen Commonly known as: LANTUS Inject 16 Units into the skin daily.   Insulin Pen Needle 31G X 5 MM Misc 30 Units by Does not apply route at bedtime.   metoCLOPramide 10 MG tablet Commonly known as: REGLAN Take 1 tablet (10 mg total) by mouth 4 (four) times daily -  before meals and at bedtime. What changed: when to take this   pantoprazole 40 MG tablet Commonly known as: PROTONIX Take 1 tablet (40 mg total) by mouth daily.   polyethylene glycol 17 g packet Commonly known as: MIRALAX / GLYCOLAX Take 17 g by mouth daily as needed for up to 14 days for mild constipation or moderate constipation. What changed:   when to take this  reasons to take this   promethazine 25 MG suppository Commonly known as: PHENERGAN Place 1 suppository (25 mg total) rectally every 6 (six) hours as needed for nausea.   traZODone 50 MG tablet Commonly known as: DESYREL Take 50 mg by mouth at bedtime.      Allergies  Allergen Reactions  . Peanut-Containing Drug Products Swelling and Other (See Comments)    Reaction:  Swelling of mouth and lips   . 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   Reaction:  Swelling of mouth and lips   . Ultram [Tramadol] Itching   Follow-up Information    Vicenta Aly, FNP Follow up.   Specialty: Nurse Practitioner Why: Follow up with PCP. If you no longer go to her call the phone number on the back of your insurance card to find out the area PCPs  that take your insurance plan. Contact information: [place] [address] [phone]        Gerome Apley, MD. Call in 1 day(s).   Specialty: Endocrinology Why: Please call for a post hospital follow-up appointment. Contact information: [address] [place] [address] [phone]        Ronald Lobo, MD. Call in 1 day(s).   Specialty: Gastroenterology Why: Please call for a post hospital follow-up appointment. Contact information: [address] [place] [address] [phone]        Scherrie November, MD. Call in 1 day(s).   Specialty: Internal Medicine Why: Please call for a post hospital follow-up appointment. Contact information: [address] [place] [address] [phone]            The results of significant diagnostics from this hospitalization (including imaging, microbiology, ancillary and laboratory) are listed below for reference.  Significant Diagnostic Studies: DG Chest Port 1 View  Result Date: 04/03/2019 CLINICAL DATA:  Status post feeding tube placement. EXAM: PORTABLE CHEST 1 VIEW COMPARISON:  December 24, 2018. FINDINGS: The heart size and mediastinal contours are within normal limits. Both lungs are clear. No pneumothorax or pleural effusion is noted. Dobbhoff tube tip is seen in the stomach. Right internal jugular Port-A-Cath is noted with distal tip in expected position of cavoatrial junction. The visualized skeletal structures are unremarkable. IMPRESSION: Dobbhoff tube tip seen in stomach. Right internal jugular Port-A-Cath seen with distal tip in expected position of cavoatrial junction. No acute abnormality seen. Electronically Signed   By: Marijo Conception M.D.   On: 04/03/2019 14:55   DG Abd Portable 1V  Result Date: 04/16/2019 CLINICAL DATA:  Clogged feeding tube EXAM: PORTABLE ABDOMEN - 1 VIEW COMPARISON:  None. FINDINGS: Feeding tube is seen  within the proximal stomach. The bowel gas pattern is normal. Surgical clips in the right upper quadrant. No radio-opaque calculi or other significant radiographic abnormality are seen. IMPRESSION: Feeding tube tip within the proximal stomach. Electronically Signed   By: Prudencio Pair M.D.   On: 04/16/2019 03:17   DG Abd Portable 1V  Result Date: 04/04/2019 CLINICAL DATA:  Tube placement EXAM: PORTABLE ABDOMEN - 1 VIEW COMPARISON:  04/03/2019, 03/26/2019 FINDINGS: Weighted enteric feeding tube is positioned with tip over the gastric body, not significantly changed. Metallic stylette remains in position. Severely overpenetrated abdominal radiograph with limited detail of the bowel gas pattern. IMPRESSION: Weighted enteric feeding tube is positioned with tip over the gastric body, not significantly changed. Metallic stylette remains in position. Electronically Signed   By: Eddie Candle M.D.   On: 04/04/2019 09:35   DG Abd Portable 1V  Result Date: 03/26/2019 CLINICAL DATA:  Abdominal pain and nausea. Last bowel movement was 1 week ago. EXAM: PORTABLE ABDOMEN - 1 VIEW COMPARISON:  02/16/2019 FINDINGS: Moderate colonic stool burden without evidence of enteric obstruction. Nondiagnostic evaluation for pneumoperitoneum secondary to supine positioning and exclusion of the lower thorax. No pneumatosis or portal venous gas. Post cholecystectomy. Punctate phleboliths overlies the left lower pelvis, similar to the 01/2019 examination. Otherwise, no definitive abnormal intra-abdominal calcifications. No acute osseous abnormalities. IMPRESSION: Moderate colonic stool burden without evidence of enteric obstruction. Electronically Signed   By: Sandi Mariscal M.D.   On: 03/26/2019 11:06   DG Naso G Tube Plc W/Fl W/Rad  Result Date: 04/06/2019 CLINICAL DATA:  Diabetes, malnutrition.  Intractable vomiting. EXAM: NASO G TUBE PLACEMENT WITH FL AND WITH RAD CONTRAST:  100 mL Omnipaque FLUOROSCOPY TIME:  Fluoroscopy Time:  6  minutes 24 seconds Radiation Exposure Index (if provided by the fluoroscopic device): Number of Acquired Spot Images: 4 COMPARISON:  None. FINDINGS: Feeding tube with weighted tip in the distal stomach. The guidewire was removed. Contrast and air were injected into the distal stomach and proximal duodenum ot visualize the pylorus. An Amplatz guidewire was placed in the feeding tube. Feeding tube was advanced into the second portion duodenum and confirmed by contrast administration. Guidewire was removed. IMPRESSION: Feeding tube advanced into second portion duodenum. Guidewire removed. Feeding tube ready for gradual advancement of nutrition Electronically Signed   By: Suzy Bouchard M.D.   On: 04/06/2019 12:18   DG Naso G Tube Plc W/Fl W/Rad  Result Date: 04/04/2019 CLINICAL DATA:  Request for advancement of feeding tube EXAM: NASO G TUBE PLACEMENT WITH FL AND WITH RAD CONTRAST:  10 cc Gastrografin FLUOROSCOPY TIME:  Fluoroscopy Time:  5 minutes 8 second Radiation Exposure Index (if provided by the fluoroscopic device): 33.7 mGy Number of Acquired Spot Images: 0 COMPARISON:  None. FINDINGS: The existing, indwelling feeding tube tip was in the proximal esophagus. Under intermittent fluoroscopic guidance the feeding tube was advanced into the stomach into the level of the antro pyloric junction. Multiple attempts to further advance the tube beyond the pylorus were unsuccessful. A total of 5 minutes and 8 seconds of fluoro time was employed. IMPRESSION: 1. The feeding tube was advanced to the level of the antro pyloric junction. Despite multiple attempts the tip of the feeding tube would not advance beyond the antro pyloric junction Electronically Signed   By: Kerby Moors M.D.   On: 04/04/2019 15:19   DG INTRO LONG GI TUBE  Result Date: 04/05/2019 CLINICAL DATA:  Nausea and vomiting. EXAM: UPPER GI SERIES WITH KUB TECHNIQUE: After obtaining a scout radiograph a routine upper GI series was performed using  thin barium. FLUOROSCOPY TIME:  Fluoroscopy Time:  2 minutes 6 seconds Radiation Exposure Index (if provided by the fluoroscopic device): 14.5 mGy Number of Acquired Spot Images: 7 COMPARISON:  None. FINDINGS: Scout view of the abdomen shows retained oral contrast in the colon. Surgical clips in the right upper quadrant. Patient drank barium from a cup. Normal esophageal motility. No esophageal fold thickening, stricture or obstruction. There is suboptimal distension of the stomach as the patient could not drink more than approximately 1/2 to 2/3 of a cup of contrast. Stomach is grossly unremarkable. Duodenum was not visualized. During the exam, it was noted that the feeding tube tip was in the upper esophagus. The wire was in place and therefore the tube was advanced under fluoroscopy into the stomach, to the pylorus. IMPRESSION: 1. No findings to explain the patient's nausea and vomiting. Assessment of the duodenal bulb is difficult due to poor opacification. 2. Feeding tube was advanced under fluoroscopy into the stomach, terminating at the pylorus. Electronically Signed   By: Lorin Picket M.D.   On: 04/05/2019 14:00   DG UGI W SINGLE CM (SOL OR THIN BA)  Result Date: 04/05/2019 CLINICAL DATA:  Nausea and vomiting. EXAM: UPPER GI SERIES WITH KUB TECHNIQUE: After obtaining a scout radiograph a routine upper GI series was performed using thin barium. FLUOROSCOPY TIME:  Fluoroscopy Time:  2 minutes 6 seconds Radiation Exposure Index (if provided by the fluoroscopic device): 14.5 mGy Number of Acquired Spot Images: 7 COMPARISON:  None. FINDINGS: Scout view of the abdomen shows retained oral contrast in the colon. Surgical clips in the right upper quadrant. Patient drank barium from a cup. Normal esophageal motility. No esophageal fold thickening, stricture or obstruction. There is suboptimal distension of the stomach as the patient could not drink more than approximately 1/2 to 2/3 of a cup of contrast.  Stomach is grossly unremarkable. Duodenum was not visualized. During the exam, it was noted that the feeding tube tip was in the upper esophagus. The wire was in place and therefore the tube was advanced under fluoroscopy into the stomach, to the pylorus. IMPRESSION: 1. No findings to explain the patient's nausea and vomiting. Assessment of the duodenal bulb is difficult due to poor opacification. 2. Feeding tube was advanced under fluoroscopy into the stomach, terminating at the pylorus. Electronically Signed   By: Lorin Picket M.D.   On: 04/05/2019 14:00   ECHOCARDIOGRAM COMPLETE  Result Date: 04/04/2019    ECHOCARDIOGRAM REPORT   Patient Name:   KIRSTIN KUGLER Date of  Exam: 04/04/2019 Medical Rec #:  [id number]         Height:       64.0 in Accession #:    [id number]        Weight:       104.7 lb Date of Birth:  1990-02-15         BSA:          1.486 m Patient Age:    28 years          BP:           125/92 mmHg Patient Gender: F                 HR:           101 bpm. Exam Location:  Inpatient Procedure: 2D Echo, Cardiac Doppler and Color Doppler Indications:    R94.31 Abnormal EKG  History:        Patient has prior history of Echocardiogram examinations, most                 recent 11/08/2018. Signs/Symptoms:Murmur; Risk                 Factors:Hypertension, Diabetes and GERD.  Sonographer:    Jonelle Sidle Dance Referring Phys: [id number] BELKYS A REGALADO IMPRESSIONS  1. Left ventricular ejection fraction, by estimation, is 65 to 70%. The left ventricle has normal function. The left ventricle has no regional wall motion abnormalities. There is mild left ventricular hypertrophy. Left ventricular diastolic parameters were normal.  2. Right ventricular systolic function is normal. The right ventricular size is normal.  3. Moderate pleural effusion in the left lateral region.  4. The mitral valve is abnormal. Mild mitral valve regurgitation.  5. The aortic valve is tricuspid. Aortic valve regurgitation is not  visualized.  6. The inferior vena cava is small, <1.2 cm and spontaneously collapses, suggesting volume depletion and a low RA pressure <3 mmHg. FINDINGS  Left Ventricle: Left ventricular ejection fraction, by estimation, is 65 to 70%. The left ventricle has normal function. The left ventricle has no regional wall motion abnormalities. The left ventricular internal cavity size was normal in size. There is  mild left ventricular hypertrophy. Left ventricular diastolic parameters were normal. Right Ventricle: The right ventricular size is normal. No increase in right ventricular wall thickness. Right ventricular systolic function is normal. Left Atrium: Left atrial size was normal in size. Right Atrium: Right atrial size was normal in size. Pericardium: There is no evidence of pericardial effusion. Mitral Valve: The mitral valve is abnormal. There is mild thickening of the mitral valve leaflet(s). Mild mitral valve regurgitation. Tricuspid Valve: The tricuspid valve is grossly normal. Tricuspid valve regurgitation is trivial. Aortic Valve: The aortic valve is tricuspid. Aortic valve regurgitation is not visualized. Pulmonic Valve: The pulmonic valve was grossly normal. Pulmonic valve regurgitation is trivial. Aorta: The aortic root, ascending aorta, aortic arch and descending aorta are all structurally normal, with no evidence of dilitation or obstruction. Venous: The inferior vena cava is small, <1.2 cm and spontaneously collapses, suggesting volume depletion and a low RA pressure <3 mmHg. IAS/Shunts: No atrial level shunt detected by color flow Doppler. Additional Comments: There is a moderate pleural effusion in the left lateral region.  LEFT VENTRICLE PLAX 2D LVIDd:         3.20 cm  Diastology LVIDs:         2.10 cm  LV e' lateral:   14.30 cm/s LV PW:  0.90 cm  LV E/e' lateral: 6.4 LV IVS:        1.20 cm  LV e' medial:    12.80 cm/s LVOT diam:     1.50 cm  LV E/e' medial:  7.2 LV SV:         43 LV SV Index:    29 LVOT Area:     1.77 cm  RIGHT VENTRICLE             IVC RV Basal diam:  1.90 cm     IVC diam: 1.20 cm RV S prime:     17.20 cm/s TAPSE (M-mode): 2.2 cm LEFT ATRIUM             Index       RIGHT ATRIUM          Index LA diam:        3.10 cm 2.09 cm/m  RA Area:     8.62 cm LA Vol (A2C):   32.3 ml 21.74 ml/m RA Volume:   15.50 ml 10.43 ml/m LA Vol (A4C):   26.6 ml 17.90 ml/m LA Biplane Vol: 30.6 ml 20.59 ml/m  AORTIC VALVE LVOT Vmax:   134.00 cm/s LVOT Vmean:  81.000 cm/s LVOT VTI:    0.241 m  AORTA Ao Root diam: 2.50 cm Ao Asc diam:  2.30 cm MITRAL VALVE MV Area (PHT): 4.15 cm    SHUNTS MV Decel Time: 183 msec    Systemic VTI:  0.24 m MV E velocity: 92.00 cm/s  Systemic Diam: 1.50 cm MV A velocity: 86.60 cm/s MV E/A ratio:  1.06 Lyman Bishop MD Electronically signed by Lyman Bishop MD Signature Date/Time: 04/04/2019/11:05:05 AM    Final     Microbiology: No results found for this or any previous visit (from the past 240 hour(s)).   Labs: Basic Metabolic Panel: Recent Labs  Lab 04/11/19 0540 04/11/19 0540 04/12/19 0550 04/12/19 0550 04/13/19 0405 04/14/19 0613 04/15/19 0500 04/16/19 0320 04/17/19 0502  NA 134*   < > 138   < > 136 137 143 140 140  K 4.4   < > 4.4   < > 4.6 4.3 4.3 3.9 4.0  CL 97*   < > 100   < > 100 102 105 107 106  CO2 30   < > 28   < > _0 GLUCOSE 287*   < > 232*   < > 278* 286* 94 112* 215*  BUN 17   < > 22*   < > 21* 18 21* 14 11  CREATININE 0.61   < > 0.58   < > 0.57 0.56 0.50 0.49 0.56  CALCIUM 8.5*   < > 9.0   < > 8.7* 8.5* 9.0 8.3* 9.1  MG 1.7   < > 1.9   < > 1.7 1.7 1.8 1.6* 1.8  PHOS 5.3*  --  5.2*  --  4.2  --   --   --   --    < > = values in this interval not displayed.   Liver Function Tests: Recent Labs  Lab 04/11/19 0540 04/12/19 0550 04/13/19 0405  AST 14* 13* 14*  ALT _1 ALKPHOS 72 62 62  BILITOT 0.3 0.2* 0.3  PROT 5.9* 6.2* 5.8*  ALBUMIN 3.0* 3.2* 3.1*   No results for input(s): LIPASE, AMYLASE in the last 168  hours. No results for input(s): AMMONIA in the last 168 hours. CBC: Recent Labs  Lab 04/13/19 0405  WBC 4.7  NEUTROABS 1.8  HGB 8.8*  HCT 28.9*  MCV 90.6  PLT 307   Cardiac Enzymes: No results for input(s): CKTOTAL, CKMB, CKMBINDEX, TROPONINI in the last 168 hours. BNP: BNP (last 3 results) No results for input(s): BNP in the last 8760 hours.  ProBNP (last 3 results) No results for input(s): PROBNP in the last 8760 hours.  CBG: Recent Labs  Lab 04/16/19 1147 04/16/19 1550 04/16/19 2006 04/16/19 2348 04/17/19 0736  GLUCAP 264* 187* 161* 235* 255*       Signed:  Kayleen Memos, MD [facility] 04/17/2019, 10:05 AM

## 2019-04-17 NOTE — Discharge Instructions (Signed)
Gastroparesis  Gastroparesis is a condition in which food takes longer than normal to empty from the stomach. The condition is usually long-lasting (chronic). It may also be called delayed gastric emptying. There is no cure, but there are treatments and things that you can do at home to help relieve symptoms. Treating the underlying condition that causes gastroparesis can also help relieve symptoms. What are the causes? In many cases, the cause of this condition is not known. Possible causes include:  A hormone (endocrine) disorder, such as hypothyroidism or diabetes.  A nervous system disease, such as Parkinson's disease or multiple sclerosis.  Cancer, infection, or surgery that affects the stomach or vagus nerve. The vagus nerve runs from your chest, through your neck, to the lower part of your brain.  A connective tissue disorder, such as scleroderma.  Certain medicines. What increases the risk? You are more likely to develop this condition if you:  Have certain disorders or diseases, including: ? An endocrine disorder. ? An eating disorder. ? Amyloidosis. ? Scleroderma. ? Parkinson's disease. ? Multiple sclerosis. ? Cancer or infection of the stomach or the vagus nerve.  Have had surgery on the stomach or vagus nerve.  Take certain medicines.  Are female. What are the signs or symptoms? Symptoms of this condition include:  Feeling full after eating very little.  Nausea.  Vomiting.  Heartburn.  Abdominal bloating.  Inconsistent blood sugar (glucose) levels on blood tests.  Lack of appetite.  Weight loss.  Acid from the stomach coming up into the esophagus (gastroesophageal reflux).  Sudden tightening (spasm) of the stomach, which can be painful. Symptoms may come and go. Some people may not notice any symptoms. How is this diagnosed? This condition is diagnosed with tests, such as:  Tests that check how long it takes food to move through the stomach and  intestines. These tests include: ? Upper gastrointestinal (GI) series. For this test, you drink a liquid that shows up well on X-rays, and then X-rays will be taken of your intestines. ? Gastric emptying scintigraphy. For this test, you eat food that contains a small amount of radioactive material, and then scans are taken. ? Wireless capsule GI monitoring system. For this test, you swallow a pill (capsule) that records information about how foods and fluid move through your stomach.  Gastric manometry. For this test, a tube is passed down your throat and into your stomach to measure electrical and muscular activity.  Endoscopy. For this test, a long, thin tube is passed down your throat and into your stomach to check for problems in your stomach lining.  Ultrasound. This test uses sound waves to create images of inside the body. This can help rule out gallbladder disease or pancreatitis as a cause of your symptoms. How is this treated? There is no cure for gastroparesis. Treatment may include:  Treating the underlying cause.  Managing your symptoms by making changes to your diet and exercise habits.  Taking medicines to control nausea and vomiting and to stimulate stomach muscles.  Getting food through a feeding tube in the hospital. This may be done in severe cases.  Having surgery to insert a device into your body that helps improve stomach emptying and control nausea and vomiting (gastric neurostimulator). Follow these instructions at home:  Take over-the-counter and prescription medicines only as told by your health care provider.  Follow instructions from your health care provider about eating or drinking restrictions. Your health care provider may recommend that you: ? Eat   smaller meals more often. ? Eat low-fat foods. ? Eat low-fiber forms of high-fiber foods. For example, eat cooked vegetables instead of raw vegetables. ? Have only liquid foods instead of solid foods. Liquid  foods are easier to digest.  Drink enough fluid to keep your urine pale yellow.  Exercise as often as told by your health care provider.  Keep all follow-up visits as told by your health care provider. This is important. Contact a health care provider if you:  Notice that your symptoms do not improve with treatment.  Have new symptoms. Get help right away if you:  Have severe abdominal pain that does not improve with treatment.  Have nausea that is severe or does not go away.  Cannot drink fluids without vomiting. Summary  Gastroparesis is a chronic condition in which food takes longer than normal to empty from the stomach.  Symptoms include nausea, vomiting, heartburn, abdominal bloating, and loss of appetite.  Eating smaller portions, and low-fat, low-fiber foods may help you manage your symptoms.  Get help right away if you have severe abdominal pain. This information is not intended to replace advice given to you by your health care provider. Make sure you discuss any questions you have with your health care provider. Document Revised: 04/05/2017 Document Reviewed: 11/10/2016 Elsevier Patient Education  2020 Elsevier Inc.  

## 2019-06-30 ENCOUNTER — Emergency Department (HOSPITAL_COMMUNITY)
Admission: EM | Admit: 2019-06-30 | Discharge: 2019-06-30 | Disposition: A | Discharge status: Home / Self Care | Payer: BC Managed Care – PPO | Attending: Emergency Medicine | Admitting: Emergency Medicine

## 2019-06-30 DIAGNOSIS — Z794 Long term (current) use of insulin: Secondary | ICD-10-CM | POA: Diagnosis not present

## 2019-06-30 DIAGNOSIS — J45909 Unspecified asthma, uncomplicated: Secondary | ICD-10-CM | POA: Insufficient documentation

## 2019-06-30 DIAGNOSIS — Z79899 Other long term (current) drug therapy: Secondary | ICD-10-CM | POA: Insufficient documentation

## 2019-06-30 DIAGNOSIS — I5032 Chronic diastolic (congestive) heart failure: Secondary | ICD-10-CM | POA: Insufficient documentation

## 2019-06-30 DIAGNOSIS — Z452 Encounter for adjustment and management of vascular access device: Secondary | ICD-10-CM | POA: Insufficient documentation

## 2019-06-30 DIAGNOSIS — Z91010 Allergy to peanuts: Secondary | ICD-10-CM | POA: Insufficient documentation

## 2019-06-30 DIAGNOSIS — I110 Hypertensive heart disease with heart failure: Secondary | ICD-10-CM | POA: Diagnosis not present

## 2019-06-30 DIAGNOSIS — E119 Type 2 diabetes mellitus without complications: Secondary | ICD-10-CM | POA: Insufficient documentation

## 2019-06-30 MED ORDER — HEPARIN SOD (PORK) LOCK FLUSH 10 UNIT/ML IV SOLN: 10.0000 [IU] | INTRAVENOUS | 0 refills | Status: DC

## 2019-06-30 MED ORDER — HEPARIN SOD (PORK) LOCK FLUSH 100 UNIT/ML IV SOLN
500.0000 [IU] | Freq: Once | INTRAVENOUS | Status: AC
  Administered 2019-06-30: 500 [IU]
  Filled 2019-06-30: qty 5

## 2019-06-30 NOTE — Progress Notes (Addendum)
TOC CM spoke to pt at bedside. Pt states [facility] placed portacath due to gastroparesis and frequent need for IV fluids. States she discussed with her PCP and their office does not flush line. She needs to have line flushed once per month. Contacted [facility] and pt can come to have flushed. Will need order completed. Will fax CM order form. Jonnie Finner RN CCM, [facility] TOC CM [phone]  Appt scheduled for [facility] for 7/12 at 1 pm to have catheter flushed. Faxed order to [facility]. Will mail orders form to pt and called her with appt information. Velma, [place] ED TOC CM [phone]

## 2019-06-30 NOTE — ED Provider Notes (Signed)
[facility] DEPT Provider Note   CSN: [id number] Arrival date & time: 06/30/19  1022     History Chief Complaint  Patient presents with  . port flushed    [name] is a 29 y.o. female.  [name] is a 29 y.o. female with a history of diabetic gastroparesis, GERD, anxiety, intractable cyclic vomiting, hypertension, who presents to the ED needing a port flush.  She states that she had a Port-A-Cath placed because of her severe gastroparesis and repeated difficulties with IV access.  She states she has been having trouble getting her set up to have this flushed as an outpatient and it has been 2 weeks since she has had her port flushed last.  She denies any pain redness or swelling over the area.  No fevers or chills.  She is not currently having any issues with nausea or vomiting.  States that she called her doctor who recommended she come here for help.  No other aggravating or relieving factors.        Past Medical History:  Diagnosis Date  . Anxiety   . Arthritis    "hands, feet, knees" (12/18/2016)  . Asthma   . Diabetic gastroparesis ([place])    Per gastric emptying study 07/09/16 which showed significant delayed gastric emptying.  . Gallstones   . Gastroparesis   . GERD (gastroesophageal reflux disease)   . Heart murmur   . Hepatic steatosis 11/26/2014   and hepatomegaly  . Hypertension    hx (12/18/2016)  . Intractable cyclical vomiting syndrome    Archie Endo 12/18/2016  . Liver mass 11/26/2014  . Pancreatitis, acute 11/26/2014  . Pneumonia    "as a teen X 1" (12/18/2016)  . Type I diabetes mellitus ([place]) 2007   IDDM.  poorly controlled, multiple admits with DKA    Patient Active Problem List   Diagnosis Date Noted  . Intractable cyclical vomiting with nausea 02/26/2019  . Gastroparesis due to secondary diabetes ([place]) 11/21/2018  . Diabetes mellitus ([place]) 11/13/2018  . HTN (hypertension) 11/13/2018  . Marijuana abuse  11/13/2018  . Acute pyelonephritis 10/07/2018  . Hydronephrosis of right kidney 10/05/2018  . Diffuse pain   . Elevated blood pressure reading 12/03/2017  . Tachycardia 11/30/2017  . Hypernatremia 11/02/2017  . Nausea and vomiting 01/22/2017  . Nausea & vomiting 01/21/2017  . Intractable cyclical vomiting syndrome 12/18/2016  . Leukocytosis 10/24/2016  . N&V (nausea and vomiting) 10/23/2016  . DKA, type 1 ([place]) 09/26/2016  . Thrombocytosis ([place]) 07/08/2016  . Candiduria, asymptomatic 06/23/2016  . Hyponatremia 06/13/2016  . Hematuria 05/13/2016  . UTI (urinary tract infection) 01/22/2016  . Diarrhea 11/09/2015  . Acute urinary retention   . Gastroparesis due to DM ([place]) 07/10/2015  . GERD (gastroesophageal reflux disease) 07/10/2015  . Depression with anxiety 07/10/2015  . Gastroparesis 06/22/2015  . Altered mental status 06/22/2015  . Volume depletion 06/10/2015  . Protein-calorie malnutrition, severe 06/10/2015  . Hyperglycemia   . Elevated bilirubin   . Hematemesis with nausea   . Intractable nausea and vomiting 05/28/2015  . Abdominal pain in female   . Abdominal pain 05/24/2015  . Hypertension 05/24/2015  . Dehydration   . Diabetic gastroparesis ([place])   . Chronic diastolic heart failure ([place]) 04/11/2015  . Hematemesis 04/08/2015  . DKA (diabetic ketoacidoses) ([place]) 03/22/2015  . S/P laparoscopic cholecystectomy 02/11/2015  . Postextubation stridor   . Pancreatitis, acute 11/26/2014  . Volume overload 11/26/2014  . Hypokalemia 11/26/2014  . Hepatic steatosis  11/26/2014  . Liver mass 11/26/2014  . Sepsis ([place]) 11/25/2014  . Sinus tachycardia 11/25/2014  . Hypomagnesemia 11/25/2014  . Hypophosphatemia 11/25/2014  . Elevated LFTs 11/24/2014  . AKI (acute kidney injury) ([place]) 11/24/2014  . Migraine headache 11/24/2014  . Asthma 06/29/2012  . Uncontrolled type 1 diabetes mellitus ([place]) 06/19/2010  . Goiter, unspecified 06/19/2010    Past Surgical History:    Procedure Laterality Date  . CHOLECYSTECTOMY N/A 02/11/2015   Procedure: LAPAROSCOPIC CHOLECYSTECTOMY WITH INTRAOPERATIVE CHOLANGIOGRAM;  Surgeon: Greer Pickerel, MD;  Location: [facility] ORS;  Service: General;  Laterality: N/A;  . ESOPHAGOGASTRODUODENOSCOPY (EGD) WITH PROPOFOL Left 09/20/2014   Procedure: ESOPHAGOGASTRODUODENOSCOPY (EGD) WITH PROPOFOL;  Surgeon: Arta Silence, MD;  Location[facility] ENDOSCOPY;  Service: Endoscopy;  Laterality: Left;  . ESOPHAGOGASTRODUODENOSCOPY (EGD) WITH PROPOFOL N/A 03/31/2019   Procedure: ESOPHAGOGASTRODUODENOSCOPY (EGD) WITH PROPOFOL;  Surgeon: Ronald Lobo, MD;  Location: [facility] ENDOSCOPY;  Service: Endoscopy;  Laterality: N/A;  . WISDOM TOOTH EXTRACTION       OB History    Gravida  2   Para  1   Term  0   Preterm  1   AB  1   Living  1     SAB  0   TAB  1   Ectopic  0   Multiple  0   Live Births  1           Family History  Problem Relation Age of Onset  . Heart disease Maternal Grandmother   . Heart disease Maternal Grandfather   . Diabetes Mother   . Hyperlipidemia Mother   . Hypertension Father   . Heart disease Father   . Hypertension Paternal Grandmother   . Cancer Paternal Grandfather     Social History   Tobacco Use  . Smoking status: Never Smoker  . Smokeless tobacco: Never Used  Vaping Use  . Vaping Use: Never used  Substance Use Topics  . Alcohol use: No  . Drug use: Not Currently    Types: Marijuana    Home Medications Prior to Admission medications   Medication Sig Start Date End Date Taking? Authorizing Provider  albuterol (PROVENTIL HFA;VENTOLIN HFA) 108 (90 Base) MCG/ACT inhaler Inhale 1-2 puffs into the lungs every 6 (six) hours as needed for wheezing or shortness of breath.    [provider]  blood glucose meter kit and supplies Dispense based on patient and insurance preference. Use up to four times daily as directed. (FOR ICD-10 E10.9, E11.9). 12/07/17   Eugenie Filler, MD  ferrous sulfate  324 (65 Fe) MG TBEC Take 1 tablet by mouth every other day. 02/08/19   [provider]  gabapentin (NEURONTIN) 100 MG capsule Take 1 capsule (100 mg total) by mouth 3 (three) times daily. 04/17/19 05/17/19  Kayleen Memos, DO  glucagon (GLUCAGON EMERGENCY) 1 MG injection Inject 1 mg into the muscle once as needed (For very low blood sugars. May repeat in 15-20 minutes if needed.). 12/20/16   Hongalgi, Lenis Dickinson, MD  insulin aspart (NOVOLOG) 100 UNIT/ML FlexPen Inject 4 Units into the skin 3 (three) times daily with meals. 04/17/19   Kayleen Memos, DO  insulin glargine (LANTUS) 100 UNIT/ML Solostar Pen Inject 16 Units into the skin daily. 04/17/19   Kayleen Memos, DO  Insulin Pen Needle 31G X 5 MM MISC 30 Units by Does not apply route at bedtime. 12/07/17   Eugenie Filler, MD  metoCLOPramide (REGLAN) 10 MG tablet Take 1 tablet (10  mg total) by mouth 4 (four) times daily -  before meals and at bedtime. 04/17/19   Darlin Drop, DO  pantoprazole (PROTONIX) 40 MG tablet Take 1 tablet (40 mg total) by mouth daily. 04/17/19 05/17/19  Darlin Drop, DO  promethazine (PHENERGAN) 25 MG suppository Place 1 suppository (25 mg total) rectally every 6 (six) hours as needed for nausea. 03/15/19   Derwood Kaplan, MD  traZODone (DESYREL) 50 MG tablet Take 50 mg by mouth at bedtime.  11/30/18   [provider]    Allergies    Peanut-containing drug products, Food, and Ultram [tramadol]  Review of Systems   Review of Systems  Constitutional: Negative for chills and fever.  Skin: Negative for color change.    Physical Exam Updated Vital Signs BP (!) 132/98 (BP Location: Left Arm)   Pulse 91   Temp 98.1 F (36.7 C) (Oral)   SpO2 100%   Physical Exam Vitals and nursing note reviewed.  Constitutional:      General: She is not in acute distress.    Appearance: Normal appearance. She is well-developed and normal weight. She is not diaphoretic.     Comments: Well-appearing and in no distress   HENT:     Head: Normocephalic and atraumatic.  Eyes:     General:        Right eye: No discharge.        Left eye: No discharge.  Pulmonary:     Effort: Pulmonary effort is normal. No respiratory distress.     Comments: Port present in the right upper chest with no overlying erythema, surrounding swelling or tenderness Musculoskeletal:        General: No deformity.  Skin:    General: Skin is warm and dry.  Neurological:     Mental Status: She is alert.     Coordination: Coordination normal.  Psychiatric:        Mood and Affect: Mood normal.        Behavior: Behavior normal.     ED Results / Procedures / Treatments   Labs (all labs ordered are listed, but only abnormal results are displayed) Labs Reviewed - No data to display  EKG None  Radiology No results found.  Procedures Procedures (including critical care time)  Medications Ordered in ED Medications  heparin lock flush 100 unit/mL (has no administration in time range)    ED Course  I have reviewed the triage vital signs and the nursing notes.  Pertinent labs & imaging results that were available during my care of the patient were reviewed by me and considered in my medical decision making (see chart for details).    MDM Rules/Calculators/A&P                         29 year old female presents requesting that her Port-A-Cath be flushed, it has been 2 weeks since she has been having trouble having this done as an outpatient.  Port is nontender without swelling or erythema.  Will have port flushed with heparin and I have placed social work consult for them to help patient with resources for this.   Port has been flushed and patient discharged home in good condition.  At this time there does not appear to be any evidence of an acute emergency medical condition and the patient appears stable for discharge with appropriate outpatient follow up.Diagnosis was discussed with patient who verbalizes understanding and is  agreeable to discharge.  Final Clinical Impression(s) / ED Diagnoses Final diagnoses:  Encounter for care related to Port-a-Cath    Rx / DC Orders ED Discharge Orders    None       Janet Berlin 06/30/19 1138    Lucrezia Starch, MD 07/04/19 [phone]

## 2019-06-30 NOTE — ED Triage Notes (Signed)
Per pt, states she needs to have port flushed-has not been done in 2 weeks-having trouble finding resources to have it done outpatient-was told by MD to come to ED for assistance

## 2019-06-30 NOTE — Discharge Instructions (Signed)
Social work will reach out to you to help with outpatient care for report

## 2019-07-31 ENCOUNTER — Other Ambulatory Visit: Payer: Self-pay

## 2019-07-31 ENCOUNTER — Ambulatory Visit (HOSPITAL_COMMUNITY)
Admission: RE | Admit: 2019-07-31 | Discharge: 2019-07-31 | Disposition: A | Discharge status: Home / Self Care | Payer: BC Managed Care – PPO | Source: Ambulatory Visit | Attending: Internal Medicine | Admitting: Internal Medicine

## 2019-07-31 DIAGNOSIS — Z452 Encounter for adjustment and management of vascular access device: Secondary | ICD-10-CM | POA: Diagnosis not present

## 2019-07-31 MED ORDER — HEPARIN SOD (PORK) LOCK FLUSH 100 UNIT/ML IV SOLN
500.0000 [IU] | INTRAVENOUS | Status: AC | PRN
  Administered 2019-07-31: 500 [IU]
  Filled 2019-07-31: qty 5

## 2019-07-31 MED ORDER — SODIUM CHLORIDE 0.9% FLUSH
10.0000 mL | INTRAVENOUS | Status: AC | PRN
  Administered 2019-07-31: 10 mL

## 2019-07-31 NOTE — Progress Notes (Signed)
PATIENT CARE CENTER NOTE    Provider: Noemi Chapel, MD   Procedure: Port-a-cath flush   Note: Patient's PAC accessed and flushed with 0.9% NS and Heparin. Sterile technique used. Patient tolerated well. AVS given. Patient alert, oriented and ambulatory at discharge.

## 2019-08-31 ENCOUNTER — Ambulatory Visit (HOSPITAL_COMMUNITY)
Admission: RE | Admit: 2019-08-31 | Discharge: 2019-08-31 | Disposition: A | Discharge status: Home / Self Care | Payer: BC Managed Care – PPO | Source: Ambulatory Visit | Attending: Internal Medicine | Admitting: Internal Medicine

## 2019-08-31 DIAGNOSIS — Z452 Encounter for adjustment and management of vascular access device: Secondary | ICD-10-CM | POA: Diagnosis present

## 2019-08-31 MED ORDER — SODIUM CHLORIDE 0.9% FLUSH
10.0000 mL | INTRAVENOUS | Status: AC | PRN
  Administered 2019-08-31: 10 mL

## 2019-08-31 MED ORDER — HEPARIN SOD (PORK) LOCK FLUSH 100 UNIT/ML IV SOLN
500.0000 [IU] | INTRAVENOUS | Status: AC | PRN
  Administered 2019-08-31: 500 [IU]
  Filled 2019-08-31: qty 5

## 2019-08-31 NOTE — Progress Notes (Signed)
PATIENT CARE CENTER NOTE  Provider: Noemi Chapel, MD   Procedure: Port-a-cath flush   Note:  Patient's PAC accessed and flushed with 0.9% NS and 500 units of heparin. Sterile technique used. Patient tolerated well, refused printed AVS, alert, oriented and ambulatory at discharge.

## 2019-11-10 ENCOUNTER — Other Ambulatory Visit: Payer: Self-pay

## 2019-11-10 ENCOUNTER — Ambulatory Visit (HOSPITAL_COMMUNITY)
Admission: RE | Admit: 2019-11-10 | Discharge: 2019-11-10 | Disposition: A | Discharge status: Home / Self Care | Payer: Medicaid Other | Source: Ambulatory Visit | Attending: Internal Medicine | Admitting: Internal Medicine

## 2019-11-10 DIAGNOSIS — Z452 Encounter for adjustment and management of vascular access device: Secondary | ICD-10-CM | POA: Diagnosis not present

## 2019-11-10 MED ORDER — SODIUM CHLORIDE 0.9% FLUSH
10.0000 mL | INTRAVENOUS | Status: AC | PRN
  Administered 2019-11-10: 10 mL

## 2019-11-10 MED ORDER — HEPARIN SOD (PORK) LOCK FLUSH 100 UNIT/ML IV SOLN
500.0000 [IU] | INTRAVENOUS | Status: AC | PRN
  Administered 2019-11-10: 500 [IU]

## 2019-11-10 NOTE — Progress Notes (Signed)
PATIENT CARE CENTER NOTE    Provider: Noemi Chapel, MD   Procedure: Port-a-cath flush   Note: Patient's PAC accessed and flushed with 0.9% NS and Heparin. Sterile technique used. Patient tolerated well. Refused AVS. Patient alert, oriented and ambulatory at discharge.

## 2019-12-11 ENCOUNTER — Ambulatory Visit (HOSPITAL_COMMUNITY): Payer: Medicaid Other | Attending: Internal Medicine

## 2020-01-04 ENCOUNTER — Ambulatory Visit (HOSPITAL_COMMUNITY)
Admission: RE | Admit: 2020-01-04 | Discharge: 2020-01-04 | Disposition: A | Discharge status: Home / Self Care | Payer: Medicaid Other | Source: Ambulatory Visit | Attending: Internal Medicine | Admitting: Internal Medicine

## 2020-01-04 ENCOUNTER — Other Ambulatory Visit: Payer: Self-pay

## 2020-01-04 DIAGNOSIS — Z452 Encounter for adjustment and management of vascular access device: Secondary | ICD-10-CM | POA: Diagnosis not present

## 2020-01-04 MED ORDER — SODIUM CHLORIDE 0.9% FLUSH: 10.0000 mL | INTRAVENOUS | Status: DC | PRN

## 2020-01-04 MED ORDER — HEPARIN SOD (PORK) LOCK FLUSH 100 UNIT/ML IV SOLN
500.0000 [IU] | INTRAVENOUS | Status: AC | PRN
  Administered 2020-01-04: 500 [IU]
  Filled 2020-01-04: qty 5

## 2020-01-04 MED ORDER — HEPARIN SOD (PORK) LOCK FLUSH 100 UNIT/ML IV SOLN: 500.0000 [IU] | INTRAVENOUS | Status: DC | PRN

## 2020-01-04 MED ORDER — SODIUM CHLORIDE 0.9% FLUSH
10.0000 mL | INTRAVENOUS | Status: AC | PRN
  Administered 2020-01-04: 10 mL

## 2020-01-04 NOTE — Progress Notes (Signed)
  Provider: Noemi Chapel MD     Procedure: Port-a-cath flush     Note: Patient's PAC accessed, flushed with 10 cc 0.9% sodium chloride and heparin. Blood return noted. PAC de-accessed, pt declined bandaid and gauze over site. Patient tolerated well. Pt instructed to make next monthly port flush appointment with scheduler, verbalized understanding.  Alert, oriented and ambulatory at discharge.

## 2020-02-05 ENCOUNTER — Encounter (HOSPITAL_COMMUNITY): Payer: Medicaid Other

## 2020-02-07 ENCOUNTER — Telehealth: Payer: Self-pay | Admitting: *Deleted

## 2020-02-07 ENCOUNTER — Telehealth (HOSPITAL_COMMUNITY): Payer: Self-pay | Admitting: General Practice

## 2020-02-07 ENCOUNTER — Encounter (HOSPITAL_COMMUNITY): Payer: Self-pay

## 2020-02-07 ENCOUNTER — Non-Acute Institutional Stay (HOSPITAL_COMMUNITY): Admission: RE | Admit: 2020-02-07 | Payer: Medicaid Other | Source: Ambulatory Visit

## 2020-02-07 ENCOUNTER — Other Ambulatory Visit: Payer: Self-pay

## 2020-02-07 NOTE — Telephone Encounter (Signed)
Patient came for an implanted port flush. RN could not find an active order for this patient. Contacted the primary care provider, with no success. Patient promised to sort it out, will call the [facility] before 16:30 today with an update. Today, port a cath was not flushed.

## 2020-02-07 NOTE — Telephone Encounter (Signed)
Patient [name] called regarding flushing pt port-a-cath.  RNCM educated that ED does not manage the catheter and to contact PCP or oncology office.

## 2020-03-02 IMAGING — DX DG ABDOMEN 2V
2 series · 2 of 2 positions shown · non-contrast
Comparison: 11/29/2017

CLINICAL DATA: Emesis

EXAM:
ABDOMEN - 2 VIEW

[abdomen erect]
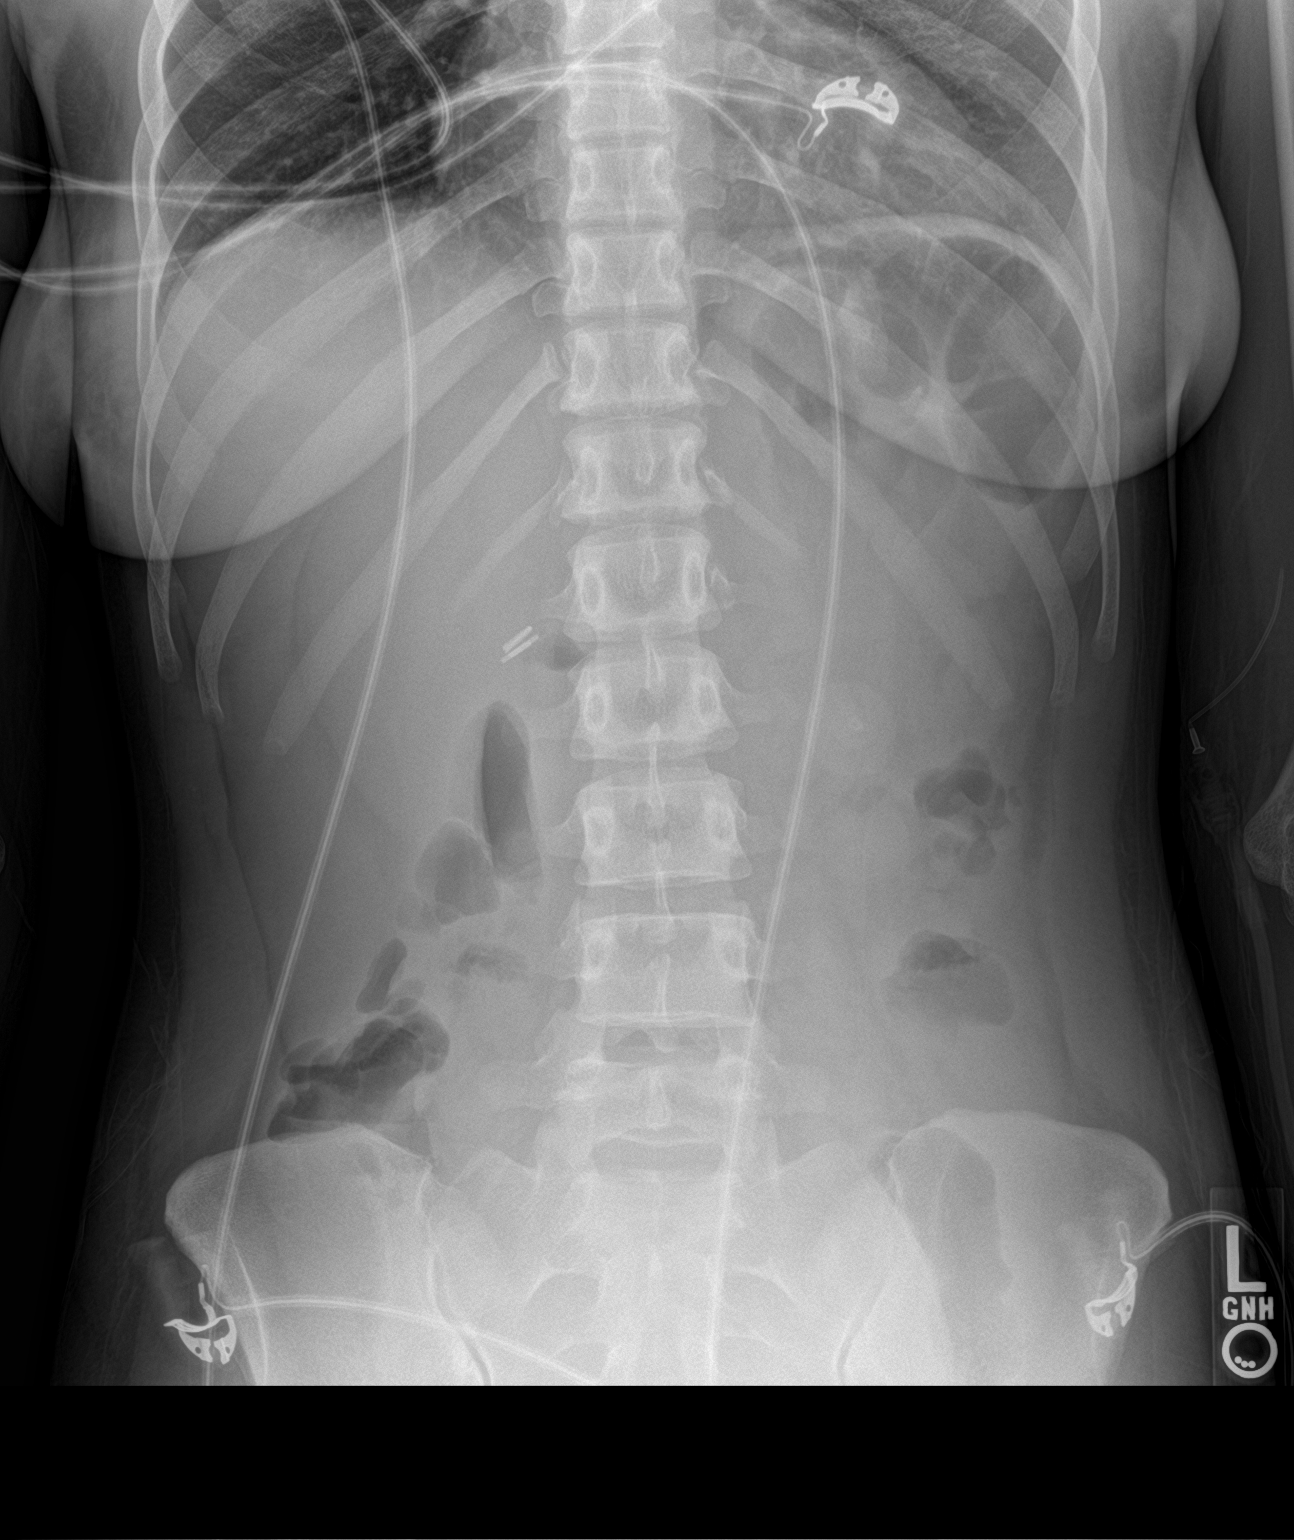

[abdomen supine]
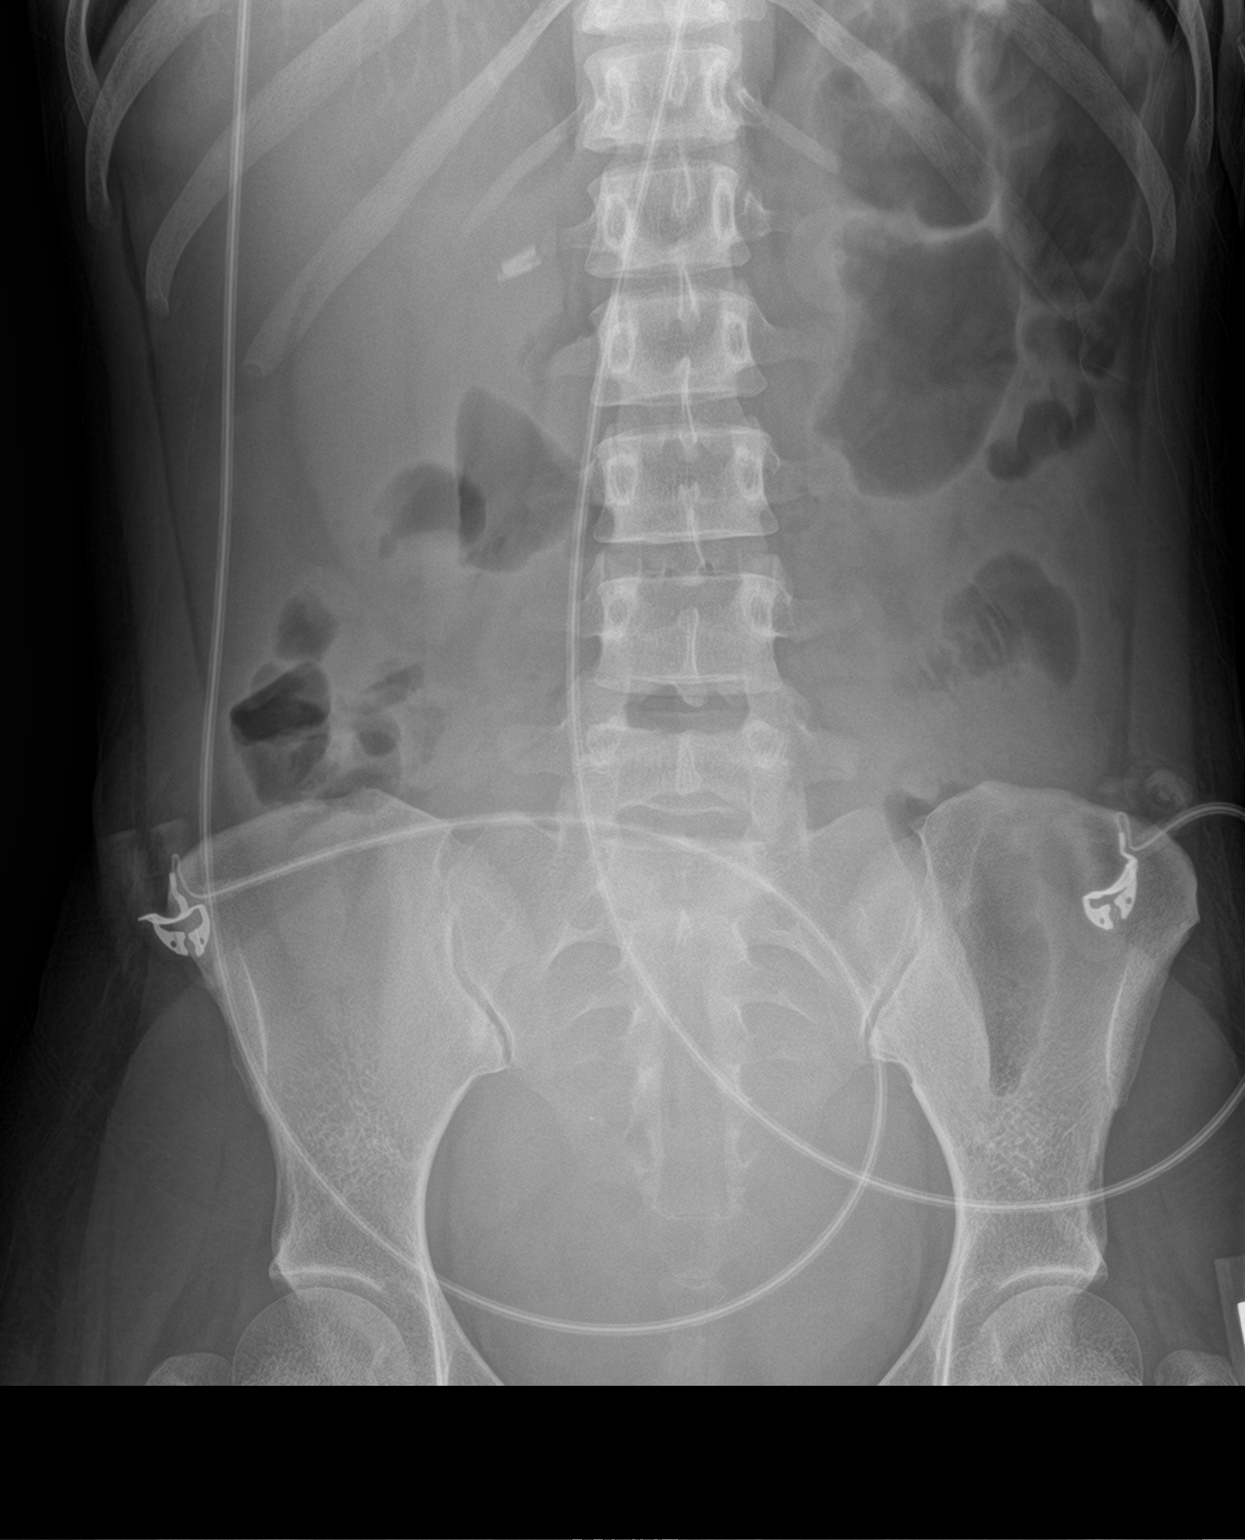

[2 of 2 positions shown; findings below may reference images not displayed]

FINDINGS: Visualized lung bases clear.

No free air.

Normal bowel gas pattern.  Cholecystectomy clips.

Stable small left pelvic phlebolith.

Regional bones unremarkable.
IMPRESSION: Negative.

## 2021-02-01 IMAGING — US US ABDOMEN COMPLETE
1 series · 14 of 25 positions shown · non-contrast
Comparison: CT 10/04/2018

CLINICAL DATA: Nausea and vomiting.

EXAM:
ABDOMEN ULTRASOUND COMPLETE

[Series 1: us abdomen complete · 14 of 49 slices shown]
[im 1/49]
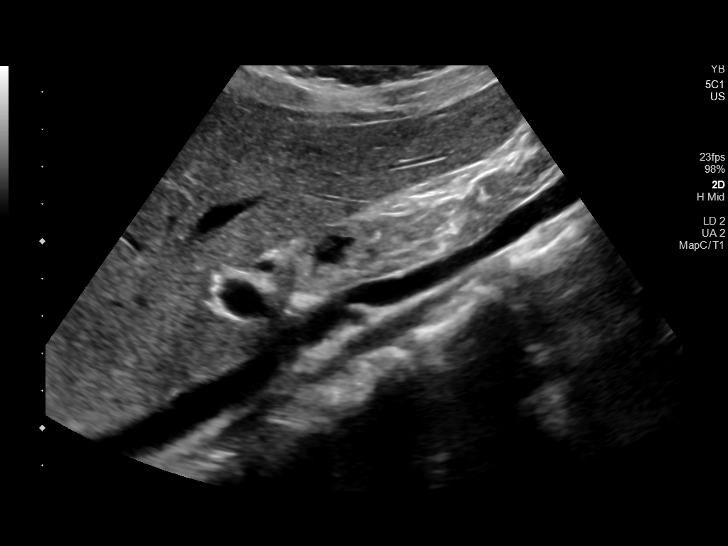
[im 5/49]
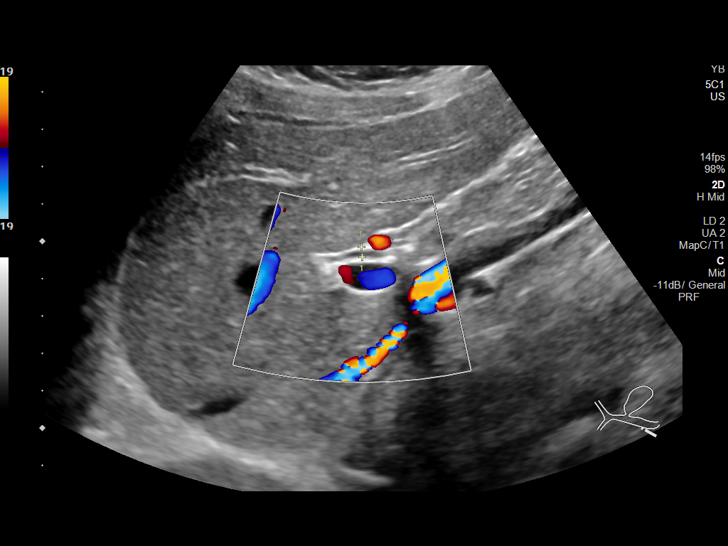
[im 9/49]
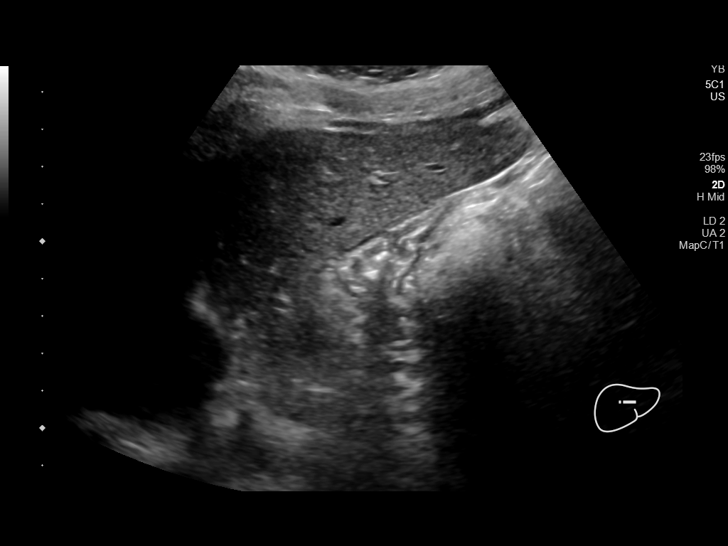
[im 13/49]
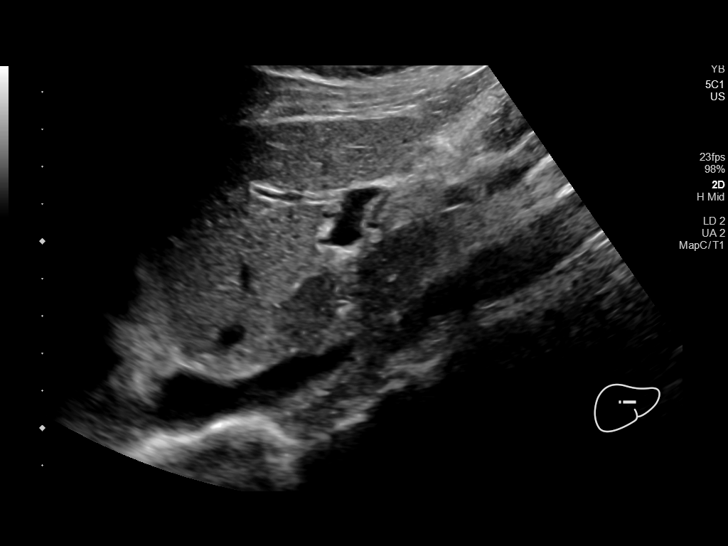
[im 17/49]
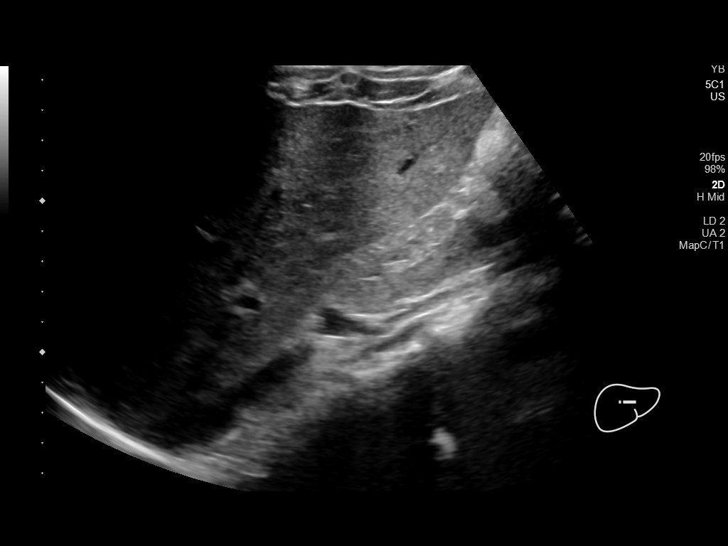
[im 19/49]
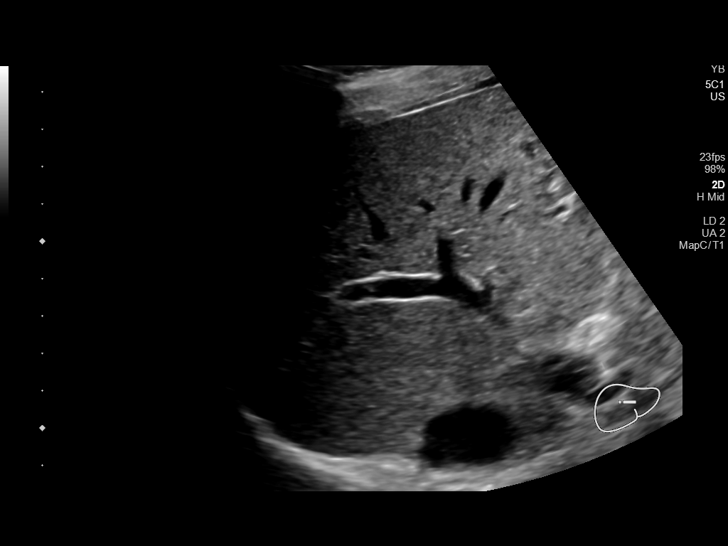
[im 23/49]
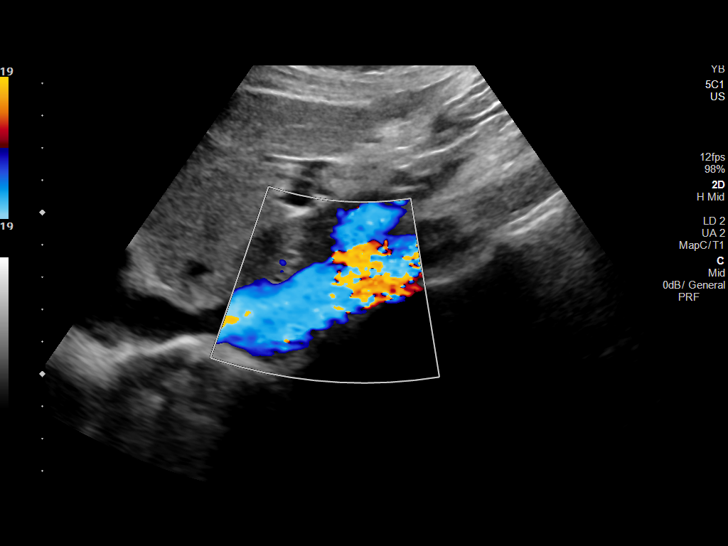
[im 27/49]
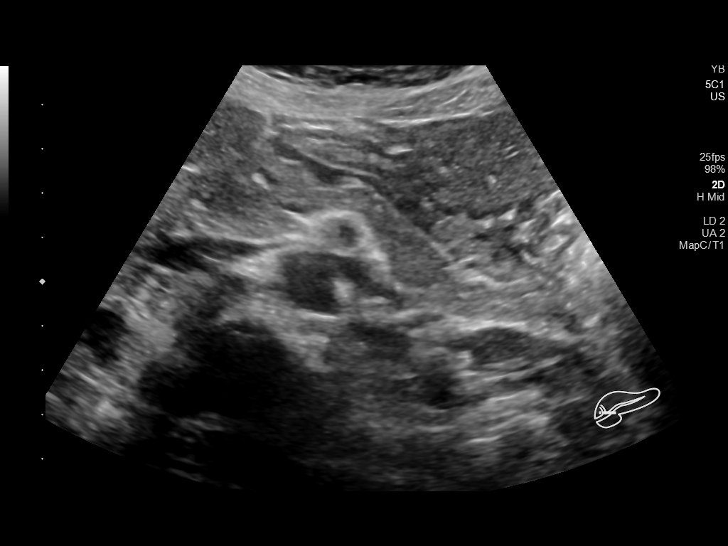
[im 31/49]
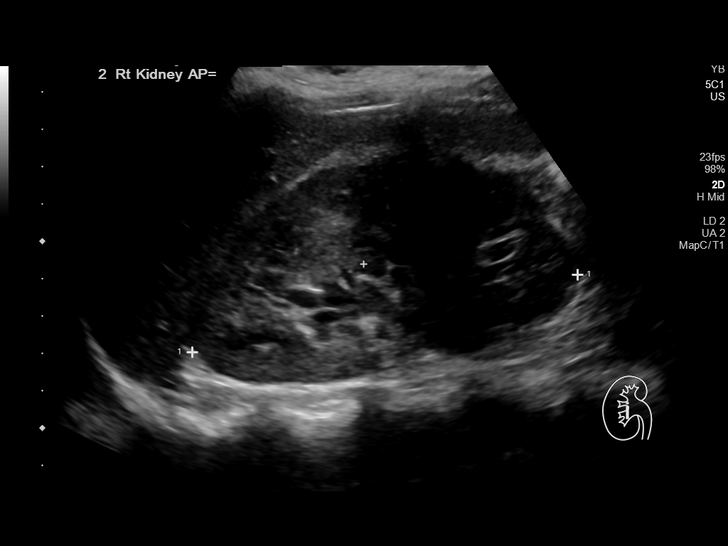
[im 33/49]
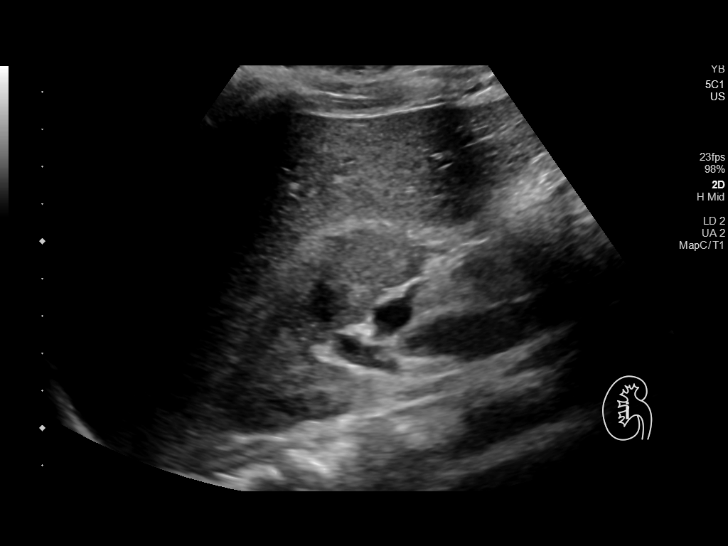
[im 37/49]
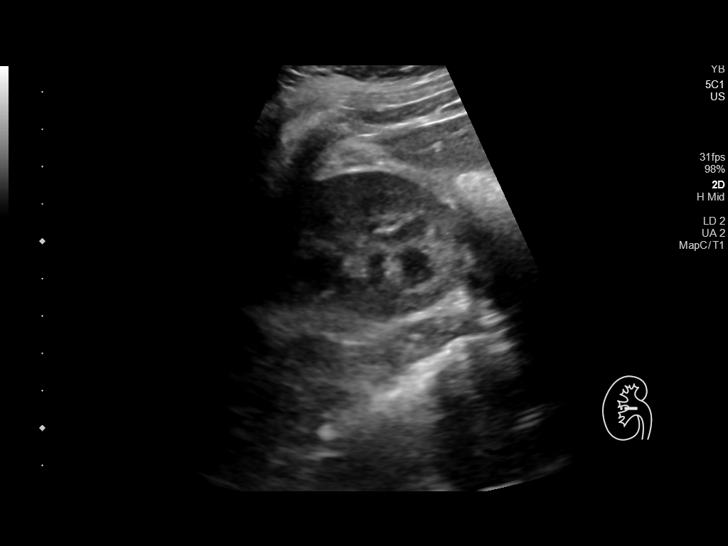
[im 41/49]
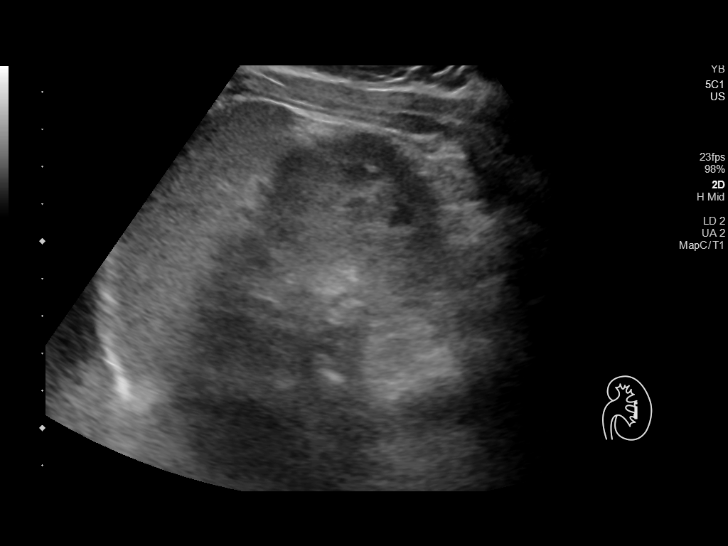
[im 45/49]
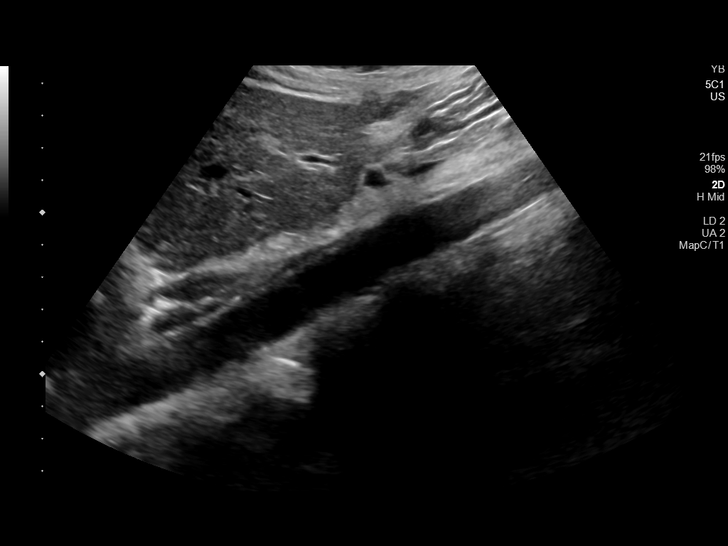
[im 49/49]
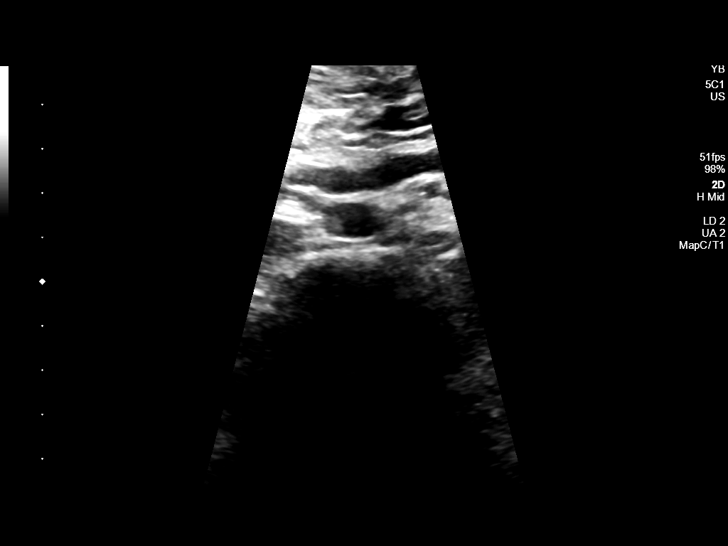

[14 of 25 positions shown; findings below may reference images not displayed]

FINDINGS: Gallbladder: Surgically absent.

Common bile duct: Diameter: 3 mm.

Liver: Subtle liver lesions on prior CT are not defined
sonographically. Within normal limits in parenchymal echogenicity.
Portal vein is patent on color Doppler imaging with normal direction
of blood flow towards the liver.

IVC: No abnormality visualized.

Pancreas: Visualized portion unremarkable.

Spleen: Size and appearance within normal limits.

Right Kidney: Length: 10.5 cm. Hydronephrosis on prior exam has
resolved. Echogenicity within normal limits.

Left Kidney: Length: 10.5 cm. Echogenicity within normal limits. No
mass or hydronephrosis visualized.

Abdominal aorta: No aneurysm visualized.

Other findings: None.
IMPRESSION: 1. Post cholecystectomy without biliary dilatation.
2. Resolved right hydronephrosis from CT last month.
3. Liver lesions on prior CT are not defined sonographically.

## 2021-02-04 IMAGING — US US RENAL
1 series · 13 of 25 positions shown · non-contrast
Comparison: Renal ultrasound 10/06/2018

CLINICAL DATA: Urinary retention, RIGHT-side pain, history type I
diabetes mellitus, hypertension

EXAM:
RENAL / URINARY TRACT ULTRASOUND COMPLETE

[Series 1: us renal · 13 of 88 slices shown]
[im 1/88]
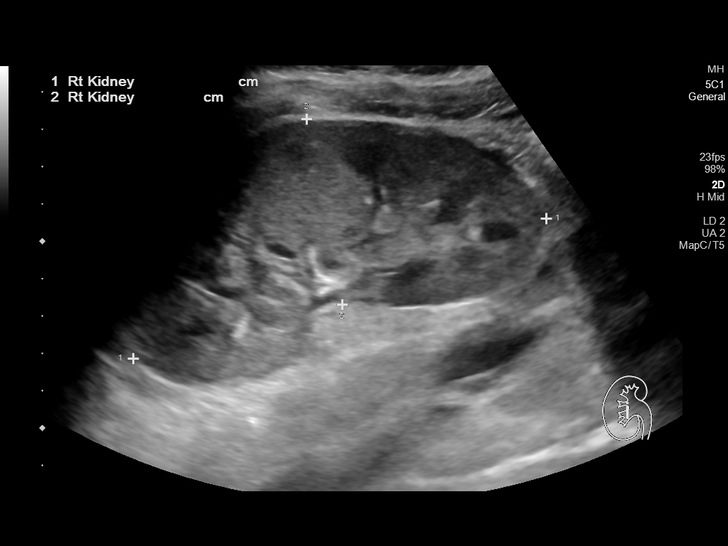
[im 8/88]
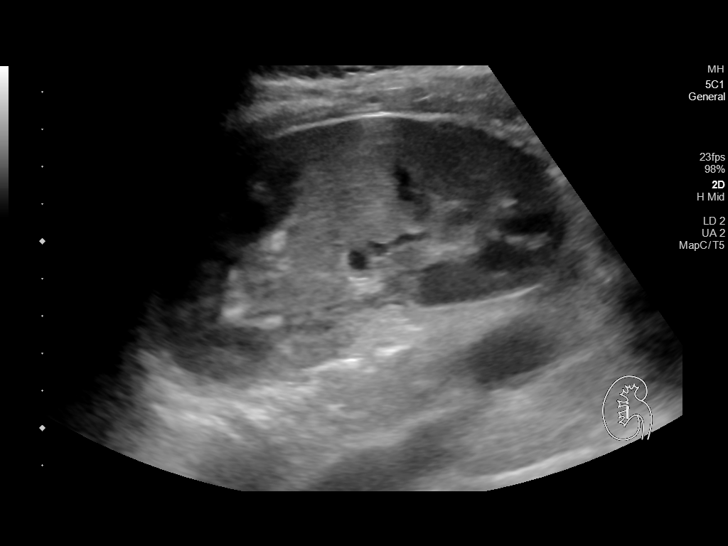
[im 15/88]
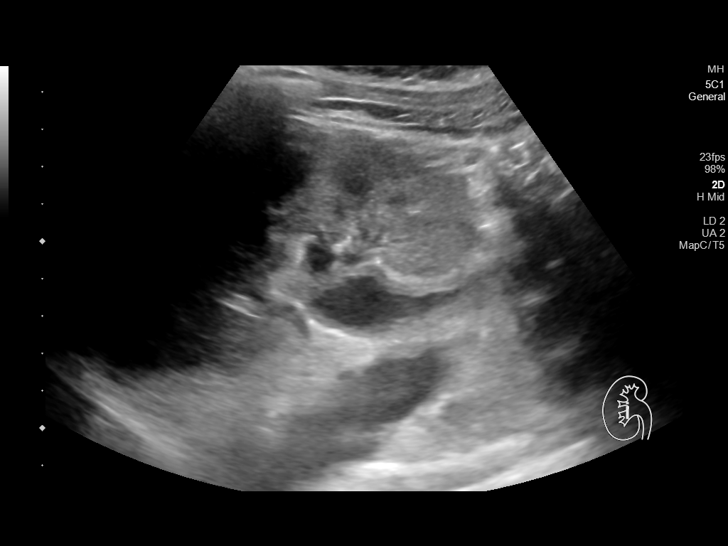
[im 22/88]
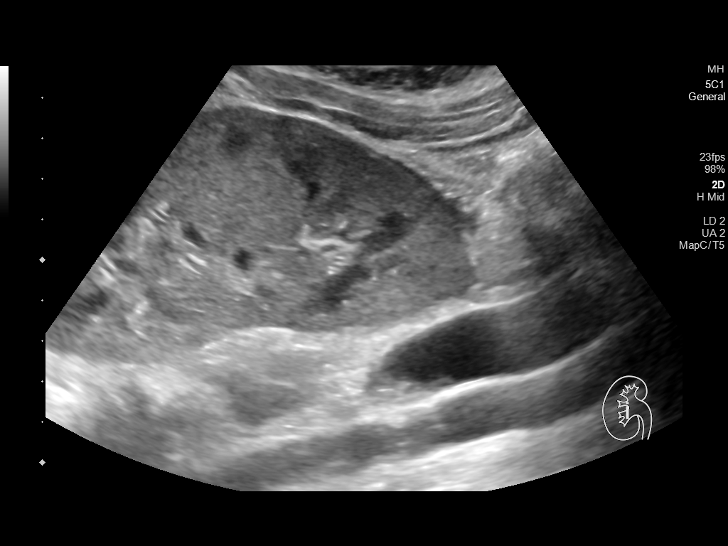
[im 30/88]
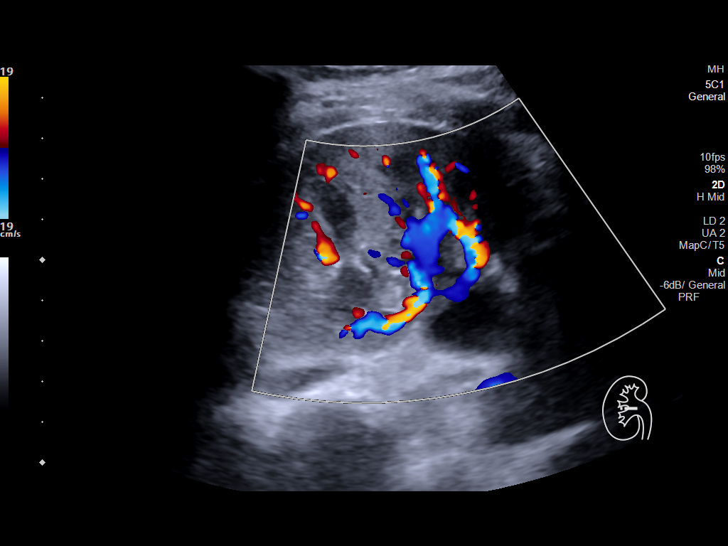
[im 37/88]
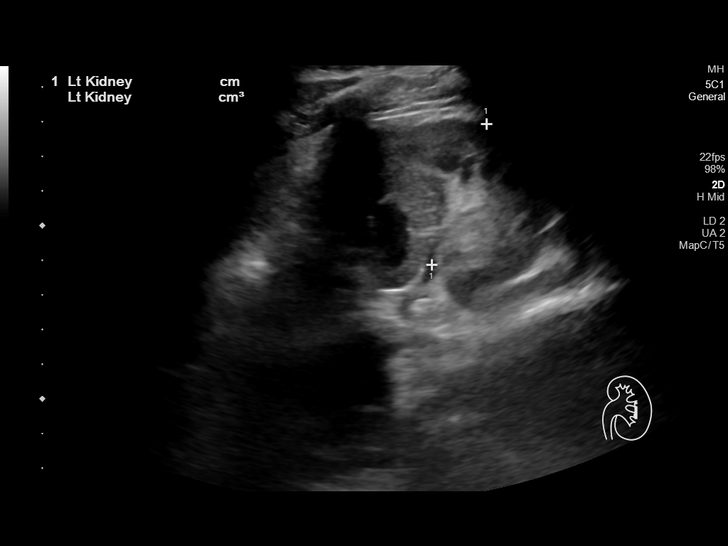
[im 44/88]
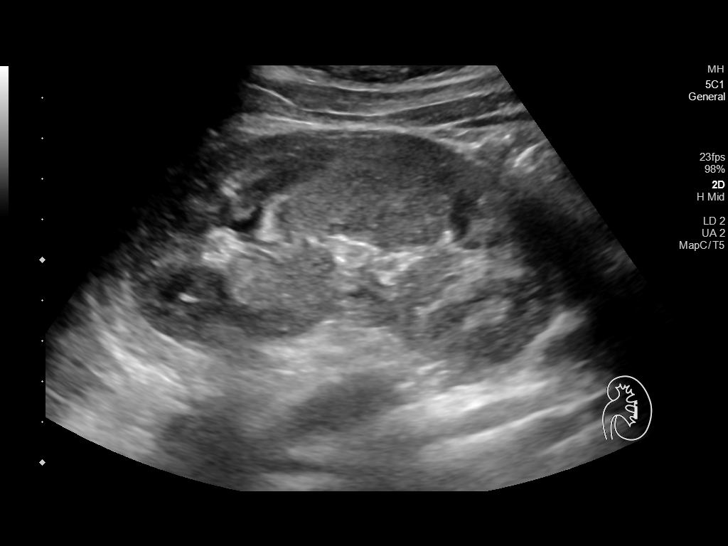
[im 51/88]
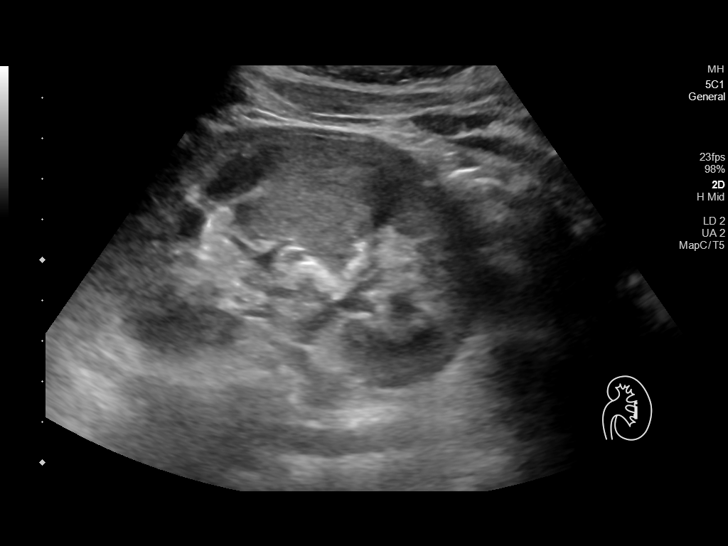
[im 59/88]
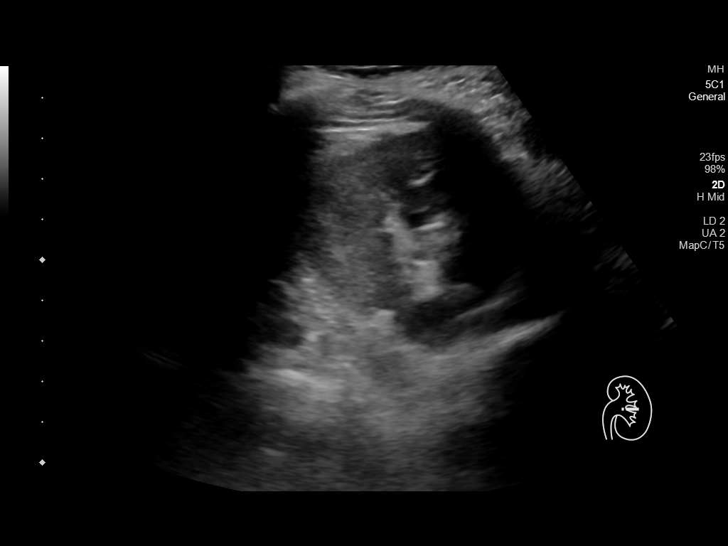
[im 66/88]
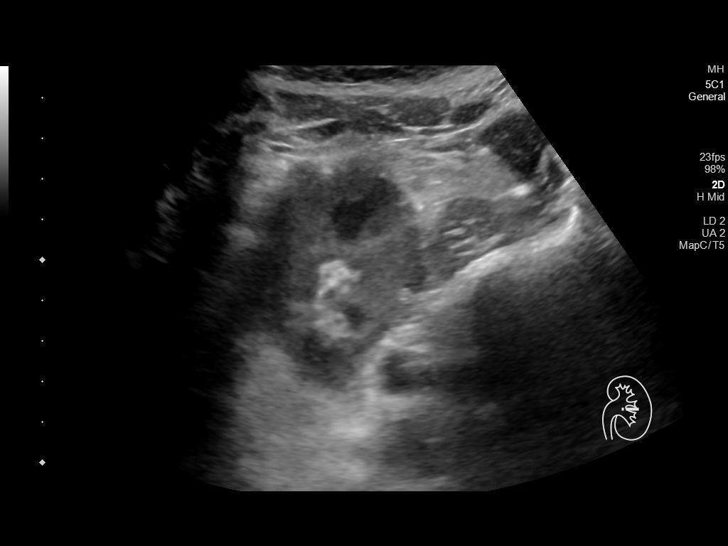
[im 73/88]
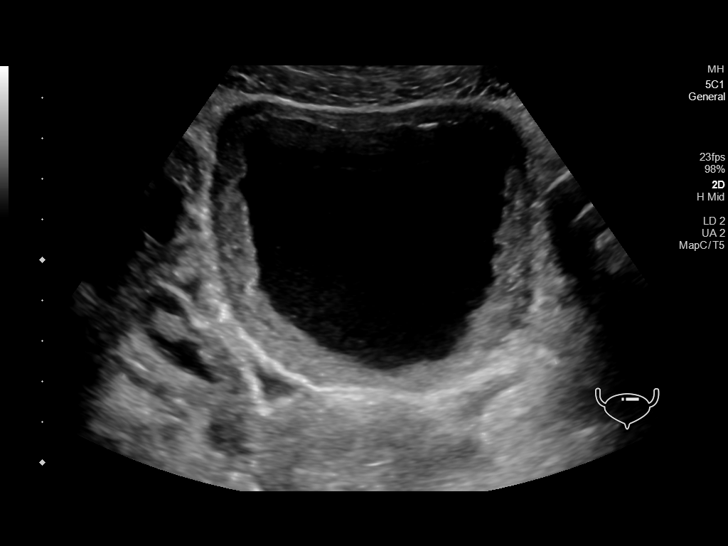
[im 80/88]
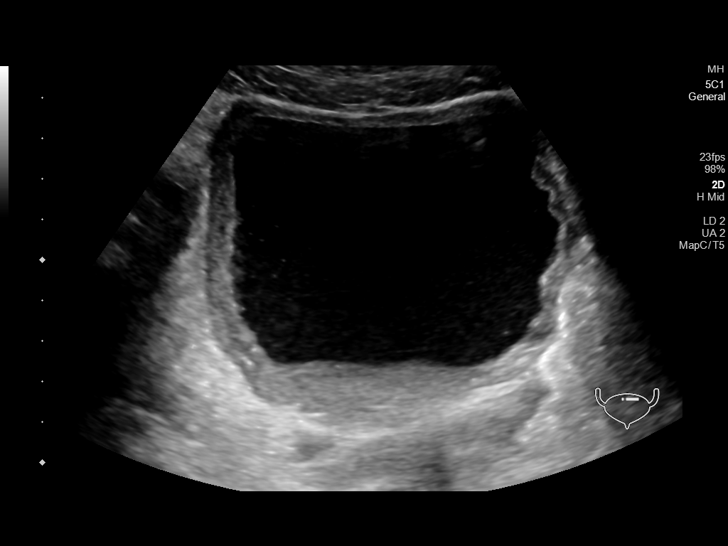
[im 88/88]
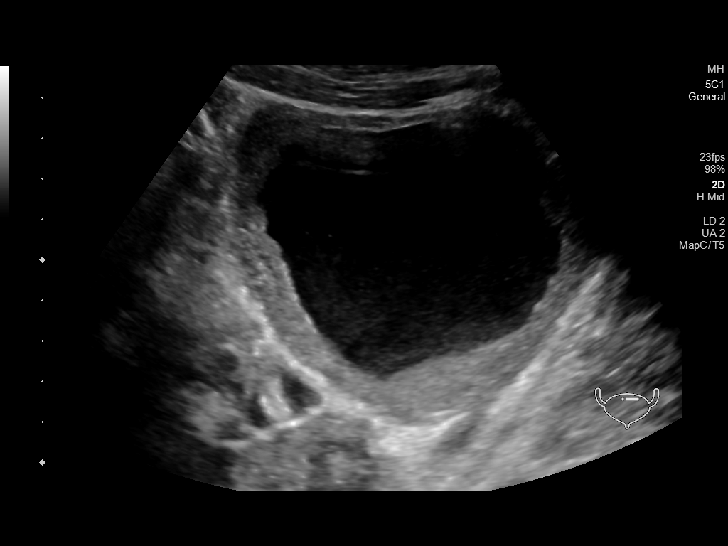

[13 of 25 positions shown; findings below may reference images not displayed]

FINDINGS: Right Kidney:

Renal measurements: 11.7 x 5.1 x 5.0 cm = volume: 156 mL. Normal
cortical thickness. Increased cortical echogenicity. Mild RIGHT
hydronephrosis. Mild thickening of the wall of the renal pelvis, 3
mm thick. No renal mass or shadowing calcification.

Left Kidney:

Renal measurements: 10.0 x 5.3 x 4.4 cm = volume: 125 mL. Normal
cortical thickness. Increased cortical echogenicity. No mass,
hydronephrosis, or shadowing calcification.

Bladder:

Bladder wall appears diffusely thickened and irregular. Scattered
debris within bladder. No focal bladder mass.

Other:

N/A
IMPRESSION: Diffuse bladder wall thickening and irregularity, associated with
significant debris within bladder, question cystitis; recommend
correlation with urinalysis.

Mild RIGHT hydronephrosis with thickening of the wall of the RIGHT
renal pelvis, can be seen with pyelonephritis/chronic infection.

Medical renal disease changes of both kidneys, suspect related to
history of type I diabetes mellitus.

## 2022-03-17 ENCOUNTER — Emergency Department (HOSPITAL_COMMUNITY)
Admission: EM | Admit: 2022-03-17 | Discharge: 2022-03-17 | Disposition: A | Discharge status: Home / Self Care | Payer: Medicaid Other | Attending: Emergency Medicine | Admitting: Emergency Medicine

## 2022-03-17 ENCOUNTER — Other Ambulatory Visit: Payer: Self-pay

## 2022-03-17 DIAGNOSIS — E109 Type 1 diabetes mellitus without complications: Secondary | ICD-10-CM | POA: Insufficient documentation

## 2022-03-17 DIAGNOSIS — I110 Hypertensive heart disease with heart failure: Secondary | ICD-10-CM | POA: Diagnosis not present

## 2022-03-17 DIAGNOSIS — R112 Nausea with vomiting, unspecified: Secondary | ICD-10-CM | POA: Insufficient documentation

## 2022-03-17 DIAGNOSIS — Z1152 Encounter for screening for COVID-19: Secondary | ICD-10-CM | POA: Diagnosis not present

## 2022-03-17 DIAGNOSIS — J45909 Unspecified asthma, uncomplicated: Secondary | ICD-10-CM | POA: Insufficient documentation

## 2022-03-17 DIAGNOSIS — I5030 Unspecified diastolic (congestive) heart failure: Secondary | ICD-10-CM | POA: Diagnosis not present

## 2022-03-17 DIAGNOSIS — Z794 Long term (current) use of insulin: Secondary | ICD-10-CM | POA: Insufficient documentation

## 2022-03-17 DIAGNOSIS — Z8616 Personal history of COVID-19: Secondary | ICD-10-CM | POA: Diagnosis not present

## 2022-03-17 DIAGNOSIS — Z91010 Allergy to peanuts: Secondary | ICD-10-CM | POA: Diagnosis not present

## 2022-03-17 LAB — CBG MONITORING, ED: Glucose-Capillary: 111 mg/dL — ABNORMAL HIGH (ref 70–99)

## 2022-03-17 LAB — RESP PANEL BY RT-PCR (RSV, FLU A&B, COVID)  RVPGX2
Influenza A by PCR: NEGATIVE
Influenza B by PCR: NEGATIVE
Resp Syncytial Virus by PCR: NEGATIVE
SARS Coronavirus 2 by RT PCR: NEGATIVE

## 2022-03-17 MED ORDER — ONDANSETRON 4 MG PO TBDP
4.0000 mg | ORAL_TABLET | Freq: Once | ORAL | Status: AC
  Administered 2022-03-17: 4 mg via ORAL
  Filled 2022-03-17: qty 1

## 2022-03-17 MED ORDER — HYDROMORPHONE HCL 1 MG/ML IJ SOLN
1.0000 mg | Freq: Once | INTRAMUSCULAR | Status: AC
  Administered 2022-03-17: 1 mg via INTRAMUSCULAR
  Filled 2022-03-17: qty 1

## 2022-03-17 MED ORDER — PROMETHAZINE HCL 25 MG/ML IJ SOLN
12.5000 mg | Freq: Four times a day (QID) | INTRAMUSCULAR | Status: DC | PRN
  Administered 2022-03-17: 12.5 mg via INTRAMUSCULAR
  Filled 2022-03-17: qty 1

## 2022-03-17 MED ORDER — PROMETHAZINE HCL 25 MG PO TABS: 25.0000 mg | ORAL_TABLET | Freq: Four times a day (QID) | ORAL | 0 refills | Status: DC | PRN

## 2022-03-17 MED ORDER — SODIUM CHLORIDE 0.9 % IV SOLN
12.5000 mg | Freq: Four times a day (QID) | INTRAVENOUS | Status: DC | PRN
  Filled 2022-03-17: qty 0.5

## 2022-03-17 MED ORDER — SODIUM CHLORIDE 0.9 % IV BOLUS: 1000.0000 mL | Freq: Once | INTRAVENOUS | Status: DC

## 2022-03-17 NOTE — ED Provider Notes (Signed)
[place] [facility] AT [facility] Provider Note   CSN: [id number] Arrival date & time: 03/17/22  1524     History  Chief Complaint  Patient presents with   Emesis    [name] is a 32 y.o. female with a past medical history significant for diastolic heart failure, asthma, hypertension, history of gastroparesis, type 1 diabetes with insulin pump who presents to the ED due to myalgias, chills, subjective fever, headache, nausea, and vomiting that started earlier today.  Patient states her insulin pump is working correctly with baseline glucose levels in the 100s.  Patient denies sick contacts.  Patient states she feels similar to when she had COVID in December.  No chest pain or shortness of breath.  Admits to numerous episodes of nonbloody, nonbilious emesis earlier today.  No diarrhea.  Denies abdominal pain.  No urinary symptoms.  Denies vaginal discharge.  History obtained from patient and past medical records. No interpreter used during encounter.       Home Medications Prior to Admission medications   Medication Sig Start Date End Date Taking? Authorizing Provider  promethazine (PHENERGAN) 25 MG tablet Take 1 tablet (25 mg total) by mouth every 6 (six) hours as needed for nausea or vomiting. 03/17/22  Yes Aitana Burry, Druscilla Brownie, PA-C  albuterol (PROVENTIL HFA;VENTOLIN HFA) 108 (90 Base) MCG/ACT inhaler Inhale 1-2 puffs into the lungs every 6 (six) hours as needed for wheezing or shortness of breath.    [provider]  blood glucose meter kit and supplies Dispense based on patient and insurance preference. Use up to four times daily as directed. (FOR ICD-10 E10.9, E11.9). 12/07/17   Eugenie Filler, MD  ferrous sulfate 324 (65 Fe) MG TBEC Take 1 tablet by mouth every other day. 02/08/19   [provider]  gabapentin (NEURONTIN) 100 MG capsule Take 1 capsule (100 mg total) by mouth 3 (three) times daily. 04/17/19 05/17/19  Kayleen Memos,  DO  glucagon (GLUCAGON EMERGENCY) 1 MG injection Inject 1 mg into the muscle once as needed (For very low blood sugars. May repeat in 15-20 minutes if needed.). 12/20/16   Hongalgi, Lenis Dickinson, MD  Heparin Lock Flush (HEPARIN FLUSH) 10 UNIT/ML SOLN injection 1 mL (10 Units total) by Intracatheter route every 30 (thirty) days. Pt needs to have port flushed with heparin once a month, please follow with PCP Nonda Lou for further instruction 06/30/19   Jacqlyn Larsen, PA-C  insulin aspart (NOVOLOG) 100 UNIT/ML FlexPen Inject 4 Units into the skin 3 (three) times daily with meals. 04/17/19   Kayleen Memos, DO  insulin glargine (LANTUS) 100 UNIT/ML Solostar Pen Inject 16 Units into the skin daily. 04/17/19   Kayleen Memos, DO  Insulin Pen Needle 31G X 5 MM MISC 30 Units by Does not apply route at bedtime. 12/07/17   Eugenie Filler, MD  metoCLOPramide (REGLAN) 10 MG tablet Take 1 tablet (10 mg total) by mouth 4 (four) times daily -  before meals and at bedtime. 04/17/19   Kayleen Memos, DO  pantoprazole (PROTONIX) 40 MG tablet Take 1 tablet (40 mg total) by mouth daily. 04/17/19 05/17/19  Kayleen Memos, DO  promethazine (PHENERGAN) 25 MG suppository Place 1 suppository (25 mg total) rectally every 6 (six) hours as needed for nausea. 03/15/19   Varney Biles, MD  traZODone (DESYREL) 50 MG tablet Take 50 mg by mouth at bedtime.  11/30/18   [provider]  Allergies    Peanut-containing drug products, Food, and Ultram [tramadol]    Review of Systems   Review of Systems  Constitutional:  Positive for chills and fever.  Respiratory:  Negative for shortness of breath.   Cardiovascular:  Negative for chest pain.  Gastrointestinal:  Positive for nausea and vomiting. Negative for abdominal pain and diarrhea.  Genitourinary:  Negative for dysuria and vaginal discharge.  Musculoskeletal:  Positive for myalgias. Negative for back pain.  All other systems reviewed and are  negative.   Physical Exam Updated Vital Signs BP 121/79 (BP Location: Right Arm)   Pulse 100   Temp 97.9 F (36.6 C) (Oral)   Resp 18   Wt 47 kg   SpO2 100%   BMI 17.79 kg/m  Physical Exam Vitals and nursing note reviewed.  Constitutional:      General: She is not in acute distress.    Appearance: She is not ill-appearing.  HENT:     Head: Normocephalic.  Eyes:     Pupils: Pupils are equal, round, and reactive to light.  Cardiovascular:     Rate and Rhythm: Regular rhythm. Tachycardia present.     Pulses: Normal pulses.     Heart sounds: Normal heart sounds. No murmur heard.    No friction rub. No gallop.  Pulmonary:     Effort: Pulmonary effort is normal.     Breath sounds: Normal breath sounds.  Abdominal:     General: Abdomen is flat. There is no distension.     Palpations: Abdomen is soft.     Tenderness: There is no abdominal tenderness. There is no guarding or rebound.  Musculoskeletal:        General: Normal range of motion.     Cervical back: Neck supple.  Skin:    General: Skin is warm and dry.  Neurological:     General: No focal deficit present.     Mental Status: She is alert.  Psychiatric:        Mood and Affect: Mood normal.        Behavior: Behavior normal.     ED Results / Procedures / Treatments   Labs (all labs ordered are listed, but only abnormal results are displayed) Labs Reviewed  CBG MONITORING, ED - Abnormal; Notable for the following components:      Result Value   Glucose-Capillary 111 (*)    All other components within normal limits  RESP PANEL BY RT-PCR (RSV, FLU A&B, COVID)  [id number]  CBC WITH DIFFERENTIAL/PLATELET  COMPREHENSIVE METABOLIC PANEL  LIPASE, BLOOD  URINALYSIS, ROUTINE W REFLEX MICROSCOPIC  I-STAT BETA HCG BLOOD, ED ([facility], [facility], [facility] ONLY)    EKG None  Radiology No results found.  Procedures Procedures    Medications Ordered in ED Medications  sodium chloride 0.9 % bolus 1,000 mL (1,000 mLs Intravenous Not  Given 03/17/22 1725)  promethazine (PHENERGAN) injection 12.5 mg (12.5 mg Intramuscular Given 03/17/22 1654)  HYDROmorphone (DILAUDID) injection 1 mg (1 mg Intramuscular Given 03/17/22 1720)  ondansetron (ZOFRAN-ODT) disintegrating tablet 4 mg (4 mg Oral Given 03/17/22 1834)    ED Course/ Medical Decision Making/ A&P Clinical Course as of 03/17/22 1921  Tue Mar 17, 2022  1712 IV attempted IV and was unsuccessful. ED attending attempted IV with no success. Will give IM medications and have patient drink water. RVP negative. Possible gastroparesis given patient's history. Phenergan and Dilaudid given. [CA]  1831 Glucose-Capillary(!): 111 CBG 111, insulin pump working correctly. Low suspicion for DKA. Patient  had another episode of emesis, zofran ODT given. Still unable to obtain IV access. [CA]    Clinical Course User Index [CA] Suzy Bouchard, PA-C                             Medical Decision Making Amount and/or Complexity of Data Reviewed External Data Reviewed: notes. Labs: ordered.  Risk Prescription drug management.   This patient presents to the ED for concern of myalgias, HA, N/V, this involves an extensive number of treatment options, and is a complaint that carries with it a high risk of complications and morbidity.  The differential diagnosis includes viral process, pneumonia, DKA, sepsis, etc  32 year old female presents to the ED due to myalgias, headache, nausea, and vomiting that started earlier today.  History of type 1 diabetes with insulin pump.  Patient notes insulin pump is working correctly with baseline glucose in the 100s.  Also endorses subjective fever.  Notes it feels similar to when she had COVID in December.  Upon arrival, patient afebrile and tachycardic at 115.  Patient in no acute distress.  Reassuring physical exam.  Abdomen soft, nondistended, nontender.  Low suspicion for acute abdomen so we will hold off on CT abdomen at this time.  Lungs clear to  auscultation bilaterally.  Low suspicion for pneumonia.  Routine labs ordered to rule electrolyte abnormalities or evidence of infection.  RVP to rule out infection. High suspicion for viral etiology. IV fluids and Phenergan given.  Will give dose of Toradol after negative pregnancy test for headache.  Unable to obtain IV access during patient's ED stay. Medication switched to IM. See notes above.  7:16 PM reassessed patient at bedside.  Patient tolerating p.o.  Offered admission for observation however, patient notes she would prefer to go home and return if symptoms return.  Unfortunately we were unable to get IV access while patient was here in the ED so no labs were obtained.  CBG at 111.  Insulin pump working.  Low suspicion for DKA.  Suspect symptoms related to possible gastroparesis versus viral etiology.  Patient discharged with Phenergan.  Advised patient to follow-up with GI doctor this week for further evaluation.  Oral hydration discussed with patient. Strict ED precautions discussed with patient. Patient states understanding and agrees to plan. Patient discharged home in no acute distress and stable vitals.  Discussed with Dr. Rogene Houston who agrees with assessment and plan.  Has PCP        Final Clinical Impression(s) / ED Diagnoses Final diagnoses:  Nausea and vomiting, unspecified vomiting type    Rx / DC Orders ED Discharge Orders          Ordered    promethazine (PHENERGAN) 25 MG tablet  Every 6 hours PRN        03/17/22 1919              Karie Kirks 03/17/22 Orlan Leavens, MD 03/18/22 [phone]

## 2022-03-17 NOTE — ED Notes (Signed)
Unsuccessful IV attempt x2.  

## 2022-03-17 NOTE — Discharge Instructions (Addendum)
It was a pleasure taking care of you today.  You were seen for nausea and vomiting.  Your COVID and flu test were negative.  Your blood glucose was 111.  Continue using your insulin pump.  I am discharging you with nausea medication.  Take as needed.  We were unable to obtain labs today.  Please follow-up with your GI doctor this week for further evaluation.  Return to the ER for any worsening symptoms.

## 2022-03-17 NOTE — ED Notes (Signed)
IV Team unable to get IV access

## 2022-03-17 NOTE — ED Notes (Signed)
Pt provided with diet ginger ale

## 2022-03-17 NOTE — ED Triage Notes (Signed)
Body aches, chills, headache, N/v that started today upon waking.  Hx DM [id number]

## 2022-12-08 ENCOUNTER — Other Ambulatory Visit: Payer: Self-pay

## 2022-12-08 ENCOUNTER — Encounter (HOSPITAL_COMMUNITY): Payer: Self-pay | Admitting: Emergency Medicine

## 2022-12-08 ENCOUNTER — Inpatient Hospital Stay (HOSPITAL_COMMUNITY)
Admission: EM | Admit: 2022-12-08 | Discharge: 2022-12-11 | DRG: 074 | Disposition: A | Discharge status: Home / Self Care | Payer: Medicaid Other | Attending: Internal Medicine | Admitting: Internal Medicine

## 2022-12-08 DIAGNOSIS — R112 Nausea with vomiting, unspecified: Secondary | ICD-10-CM | POA: Diagnosis present

## 2022-12-08 DIAGNOSIS — K529 Noninfective gastroenteritis and colitis, unspecified: Secondary | ICD-10-CM | POA: Diagnosis present

## 2022-12-08 DIAGNOSIS — I509 Heart failure, unspecified: Secondary | ICD-10-CM | POA: Diagnosis present

## 2022-12-08 DIAGNOSIS — Z888 Allergy status to other drugs, medicaments and biological substances status: Secondary | ICD-10-CM

## 2022-12-08 DIAGNOSIS — E1010 Type 1 diabetes mellitus with ketoacidosis without coma: Secondary | ICD-10-CM | POA: Diagnosis present

## 2022-12-08 DIAGNOSIS — Z83438 Family history of other disorder of lipoprotein metabolism and other lipidemia: Secondary | ICD-10-CM

## 2022-12-08 DIAGNOSIS — K760 Fatty (change of) liver, not elsewhere classified: Secondary | ICD-10-CM | POA: Diagnosis present

## 2022-12-08 DIAGNOSIS — Z9641 Presence of insulin pump (external) (internal): Secondary | ICD-10-CM | POA: Diagnosis present

## 2022-12-08 DIAGNOSIS — K219 Gastro-esophageal reflux disease without esophagitis: Secondary | ICD-10-CM | POA: Diagnosis present

## 2022-12-08 DIAGNOSIS — K3184 Gastroparesis: Secondary | ICD-10-CM | POA: Diagnosis present

## 2022-12-08 DIAGNOSIS — Z794 Long term (current) use of insulin: Secondary | ICD-10-CM

## 2022-12-08 DIAGNOSIS — Z91010 Allergy to peanuts: Secondary | ICD-10-CM

## 2022-12-08 DIAGNOSIS — N390 Urinary tract infection, site not specified: Secondary | ICD-10-CM | POA: Diagnosis not present

## 2022-12-08 DIAGNOSIS — Z885 Allergy status to narcotic agent status: Secondary | ICD-10-CM

## 2022-12-08 DIAGNOSIS — E1043 Type 1 diabetes mellitus with diabetic autonomic (poly)neuropathy: Principal | ICD-10-CM | POA: Diagnosis present

## 2022-12-08 DIAGNOSIS — Z9049 Acquired absence of other specified parts of digestive tract: Secondary | ICD-10-CM

## 2022-12-08 DIAGNOSIS — Z8249 Family history of ischemic heart disease and other diseases of the circulatory system: Secondary | ICD-10-CM

## 2022-12-08 DIAGNOSIS — Z79899 Other long term (current) drug therapy: Secondary | ICD-10-CM

## 2022-12-08 DIAGNOSIS — R1084 Generalized abdominal pain: Principal | ICD-10-CM

## 2022-12-08 DIAGNOSIS — Z833 Family history of diabetes mellitus: Secondary | ICD-10-CM

## 2022-12-08 DIAGNOSIS — Z8701 Personal history of pneumonia (recurrent): Secondary | ICD-10-CM

## 2022-12-08 DIAGNOSIS — J45909 Unspecified asthma, uncomplicated: Secondary | ICD-10-CM | POA: Diagnosis present

## 2022-12-08 DIAGNOSIS — I110 Hypertensive heart disease with heart failure: Secondary | ICD-10-CM | POA: Diagnosis present

## 2022-12-08 LAB — CBC WITH DIFFERENTIAL/PLATELET
Abs Immature Granulocytes: 0.08 10*3/uL — ABNORMAL HIGH (ref 0.00–0.07)
Basophils Absolute: 0.1 10*3/uL (ref 0.0–0.1)
Basophils Relative: 0 %
Eosinophils Absolute: 0 10*3/uL (ref 0.0–0.5)
Eosinophils Relative: 0 %
HCT: 37.7 % (ref 36.0–46.0)
Hemoglobin: 12.5 g/dL (ref 12.0–15.0)
Immature Granulocytes: 0 %
Lymphocytes Relative: 6 %
Lymphs Abs: 1.3 10*3/uL (ref 0.7–4.0)
MCH: 30.5 pg (ref 26.0–34.0)
MCHC: 33.2 g/dL (ref 30.0–36.0)
MCV: 92 fL (ref 80.0–100.0)
Monocytes Absolute: 0.9 10*3/uL (ref 0.1–1.0)
Monocytes Relative: 4 %
Neutro Abs: 18.5 10*3/uL — ABNORMAL HIGH (ref 1.7–7.7)
Neutrophils Relative %: 90 %
Platelets: 280 10*3/uL (ref 150–400)
RBC: 4.1 MIL/uL (ref 3.87–5.11)
RDW: 13 % (ref 11.5–15.5)
WBC: 20.8 10*3/uL — ABNORMAL HIGH (ref 4.0–10.5)
nRBC: 0 % (ref 0.0–0.2)

## 2022-12-08 LAB — BASIC METABOLIC PANEL
Anion gap: 13 (ref 5–15)
BUN: 14 mg/dL (ref 6–20)
CO2: 20 mmol/L — ABNORMAL LOW (ref 22–32)
Calcium: 9.2 mg/dL (ref 8.9–10.3)
Chloride: 103 mmol/L (ref 98–111)
Creatinine, Ser: 0.71 mg/dL (ref 0.44–1.00)
GFR, Estimated: >60 mL/min (ref >60)
Glucose, Bld: 302 mg/dL — ABNORMAL HIGH (ref 70–99)
Potassium: 3.9 mmol/L (ref 3.5–5.1)
Sodium: 136 mmol/L (ref 135–145)

## 2022-12-08 LAB — CBG MONITORING, ED: Glucose-Capillary: 203 mg/dL — ABNORMAL HIGH (ref 70–99)

## 2022-12-08 MED ORDER — SODIUM CHLORIDE 0.9 % IV SOLN
12.5000 mg | Freq: Four times a day (QID) | INTRAVENOUS | Status: DC | PRN
  Administered 2022-12-09: 12.5 mg via INTRAVENOUS
  Filled 2022-12-08: qty 12.5

## 2022-12-08 MED ORDER — MORPHINE SULFATE (PF) 4 MG/ML IV SOLN
4.0000 mg | Freq: Once | INTRAVENOUS | Status: AC
  Administered 2022-12-09: 4 mg via INTRAVENOUS
  Filled 2022-12-08: qty 1

## 2022-12-08 MED ORDER — METOCLOPRAMIDE HCL 5 MG/ML IJ SOLN
10.0000 mg | Freq: Once | INTRAMUSCULAR | Status: AC
  Administered 2022-12-08: 10 mg via INTRAVENOUS
  Filled 2022-12-08: qty 2

## 2022-12-08 MED ORDER — SODIUM CHLORIDE 0.9 % IV BOLUS
1000.0000 mL | Freq: Once | INTRAVENOUS | Status: AC
  Administered 2022-12-08: 1000 mL via INTRAVENOUS

## 2022-12-08 MED ORDER — HYDROMORPHONE HCL 1 MG/ML IJ SOLN
1.0000 mg | Freq: Once | INTRAMUSCULAR | Status: AC
  Administered 2022-12-08: 1 mg via INTRAMUSCULAR
  Filled 2022-12-08: qty 1

## 2022-12-08 MED ORDER — SODIUM CHLORIDE 0.9 % IV BOLUS
1000.0000 mL | Freq: Once | INTRAVENOUS | Status: AC
  Administered 2022-12-09: 1000 mL via INTRAVENOUS

## 2022-12-08 MED ORDER — DROPERIDOL 2.5 MG/ML IJ SOLN
1.2500 mg | Freq: Once | INTRAMUSCULAR | Status: AC
  Administered 2022-12-08: 1.25 mg via INTRAVENOUS
  Filled 2022-12-08: qty 2

## 2022-12-08 MED ORDER — PROMETHAZINE HCL 25 MG/ML IJ SOLN
25.0000 mg | Freq: Once | INTRAMUSCULAR | Status: AC
  Administered 2022-12-08: 25 mg via INTRAMUSCULAR
  Filled 2022-12-08: qty 1

## 2022-12-08 NOTE — ED Provider Notes (Signed)
[place] EMERGENCY DEPARTMENT AT [facility] Provider Note   CSN: [id number] Arrival date & time: 12/08/22  1921     History  Chief Complaint  Patient presents with   Emesis    [name] is a 32 y.o. female.  HPI Patient with multiple medical including gastroparesis, diabetes, now presents with 1 hour of nausea, vomiting, generalized discomfort.  She notes that this accommodation of similar she is experienced on prior ED visits.  She was well prior to the onset.  No obvious precipitant.  Discomfort is diffuse.  No ability to take anything for relief.    Home Medications Prior to Admission medications   Medication Sig Start Date End Date Taking? Authorizing Provider  albuterol (PROVENTIL HFA;VENTOLIN HFA) 108 (90 Base) MCG/ACT inhaler Inhale 1-2 puffs into the lungs every 6 (six) hours as needed for wheezing or shortness of breath.    [provider]  blood glucose meter kit and supplies Dispense based on patient and insurance preference. Use up to four times daily as directed. (FOR ICD-10 E10.9, E11.9). 12/07/17   Rodolph Bong, MD  ferrous sulfate 324 (65 Fe) MG TBEC Take 1 tablet by mouth every other day. 02/08/19   [provider]  gabapentin (NEURONTIN) 100 MG capsule Take 1 capsule (100 mg total) by mouth 3 (three) times daily. 04/17/19 05/17/19  Darlin Drop, DO  glucagon (GLUCAGON EMERGENCY) 1 MG injection Inject 1 mg into the muscle once as needed (For very low blood sugars. May repeat in 15-20 minutes if needed.). 12/20/16   Hongalgi, Maximino Greenland, MD  Heparin Lock Flush (HEPARIN FLUSH) 10 UNIT/ML SOLN injection 1 mL (10 Units total) by Intracatheter route every 30 (thirty) days. Pt needs to have port flushed with heparin once a month, please follow with PCP Cherylann Ratel for further instruction 06/30/19   Simeon Craft, PA-C  insulin aspart (NOVOLOG) 100 UNIT/ML FlexPen Inject 4 Units into the skin 3 (three) times daily with meals.  04/17/19   Darlin Drop, DO  insulin glargine (LANTUS) 100 UNIT/ML Solostar Pen Inject 16 Units into the skin daily. 04/17/19   Darlin Drop, DO  Insulin Pen Needle 31G X 5 MM MISC 30 Units by Does not apply route at bedtime. 12/07/17   Rodolph Bong, MD  metoCLOPramide (REGLAN) 10 MG tablet Take 1 tablet (10 mg total) by mouth 4 (four) times daily -  before meals and at bedtime. 04/17/19   Darlin Drop, DO  pantoprazole (PROTONIX) 40 MG tablet Take 1 tablet (40 mg total) by mouth daily. 04/17/19 05/17/19  Darlin Drop, DO  promethazine (PHENERGAN) 25 MG suppository Place 1 suppository (25 mg total) rectally every 6 (six) hours as needed for nausea. 03/15/19   Derwood Kaplan, MD  promethazine (PHENERGAN) 25 MG tablet Take 1 tablet (25 mg total) by mouth every 6 (six) hours as needed for nausea or vomiting. 03/17/22   Mannie Stabile, PA-C  traZODone (DESYREL) 50 MG tablet Take 50 mg by mouth at bedtime.  11/30/18   [provider]      Allergies    Peanut-containing drug products, Food, and Ultram [tramadol]    Review of Systems   Review of Systems  Physical Exam Updated Vital Signs BP (!) 134/93   Pulse 91   Temp (!) 96.7 F (35.9 C) (Rectal)   Resp 20   Ht 5\' 3"  (1.6 m)   Wt 63.5 kg   SpO2 100%   BMI  24.80 kg/m  Physical Exam Vitals and nursing note reviewed.  Constitutional:      Appearance: She is well-developed. She is ill-appearing.     Comments: Uncomfortable appearing thin adult female awake and alert  HENT:     Head: Normocephalic and atraumatic.  Eyes:     Conjunctiva/sclera: Conjunctivae normal.  Cardiovascular:     Rate and Rhythm: Normal rate and regular rhythm.  Pulmonary:     Effort: Tachypnea present.     Breath sounds: Normal breath sounds. No stridor.  Abdominal:     General: There is no distension.  Skin:    General: Skin is warm and dry.  Neurological:     Mental Status: She is alert and oriented to person, place, and time.      Cranial Nerves: No cranial nerve deficit.  Psychiatric:        Mood and Affect: Mood normal.     ED Results / Procedures / Treatments   Labs (all labs ordered are listed, but only abnormal results are displayed) Labs Reviewed  BASIC METABOLIC PANEL - Abnormal; Notable for the following components:      Result Value   CO2 20 (*)    Glucose, Bld 302 (*)    All other components within normal limits  CBC WITH DIFFERENTIAL/PLATELET - Abnormal; Notable for the following components:   WBC 20.8 (*)    Neutro Abs 18.5 (*)    Abs Immature Granulocytes 0.08 (*)    All other components within normal limits  CBG MONITORING, ED - Abnormal; Notable for the following components:   Glucose-Capillary 203 (*)    All other components within normal limits  URINALYSIS, ROUTINE W REFLEX MICROSCOPIC  PREGNANCY, URINE    EKG None  Radiology No results found.  Procedures Procedures    Medications Ordered in ED Medications  sodium chloride 0.9 % bolus 1,000 mL (has no administration in time range)  morphine (PF) 4 MG/ML injection 4 mg (has no administration in time range)  promethazine (PHENERGAN) 12.5 mg in sodium chloride 0.9 % 50 mL IVPB (has no administration in time range)  promethazine (PHENERGAN) injection 25 mg (25 mg Intramuscular Given 12/08/22 2002)  HYDROmorphone (DILAUDID) injection 1 mg (1 mg Intramuscular Given 12/08/22 2003)  sodium chloride 0.9 % bolus 1,000 mL (1,000 mLs Intravenous New Bag/Given 12/08/22 2150)  droperidol (INAPSINE) 2.5 MG/ML injection 1.25 mg (1.25 mg Intravenous Given 12/08/22 2150)  metoCLOPramide (REGLAN) injection 10 mg (10 mg Intravenous Given 12/08/22 2240)    ED Course/ Medical Decision Making/ A&P                                 Medical Decision Making Adult female with history of gastroparesis, intractable nausea, vomiting, as well as asthma, heart failure and hypertension presents with nausea, vomiting, 1 hour.  Patient is awake, alert,  afebrile.  Concern for gastroparesis versus other infectious etiology.  Patient had IM meds as IV access was difficult.   Pulse ox 95% room air normal Cardiac 95 sinus normal   Amount and/or Complexity of Data Reviewed Labs: ordered.  Risk Prescription drug management.   Update Patient with ongoing vomiting, vital signs otherwise stable, initial labs with leukocytosis, mild hyperglycemia.  Additional labs pending, patient receiving fluid resuscitation, droperidol  11:18 PM Patient awake, alert, answering questions much better, feeling better, heart rate now slightly more than 100, suspicion for acute on chronic abdominal pain given the  otherwise reassuring abdominal exam, absence of fever, preserved mental status.  She and I discussed resuscitation thus far, meds, patient will continue fluid resuscitation, receive 1 additional round of medications, will follow-up with her primary care physician.       Final Clinical Impression(s) / ED Diagnoses Final diagnoses:  Generalized abdominal pain    Rx / DC Orders ED Discharge Orders     None         Gerhard Munch, MD 12/08/22 2330

## 2022-12-08 NOTE — ED Triage Notes (Signed)
Pt called EMS for having nausea and vomiting over an hour.

## 2022-12-08 NOTE — Discharge Instructions (Signed)
Be sure to follow-up with your physician tomorrow.

## 2022-12-09 DIAGNOSIS — J45909 Unspecified asthma, uncomplicated: Secondary | ICD-10-CM | POA: Diagnosis present

## 2022-12-09 DIAGNOSIS — I110 Hypertensive heart disease with heart failure: Secondary | ICD-10-CM | POA: Diagnosis present

## 2022-12-09 DIAGNOSIS — Z885 Allergy status to narcotic agent status: Secondary | ICD-10-CM | POA: Diagnosis not present

## 2022-12-09 DIAGNOSIS — E1010 Type 1 diabetes mellitus with ketoacidosis without coma: Secondary | ICD-10-CM | POA: Diagnosis present

## 2022-12-09 DIAGNOSIS — K760 Fatty (change of) liver, not elsewhere classified: Secondary | ICD-10-CM | POA: Diagnosis present

## 2022-12-09 DIAGNOSIS — Z8249 Family history of ischemic heart disease and other diseases of the circulatory system: Secondary | ICD-10-CM | POA: Diagnosis not present

## 2022-12-09 DIAGNOSIS — K3184 Gastroparesis: Secondary | ICD-10-CM | POA: Diagnosis present

## 2022-12-09 DIAGNOSIS — Z888 Allergy status to other drugs, medicaments and biological substances status: Secondary | ICD-10-CM | POA: Diagnosis not present

## 2022-12-09 DIAGNOSIS — E1065 Type 1 diabetes mellitus with hyperglycemia: Secondary | ICD-10-CM | POA: Diagnosis not present

## 2022-12-09 DIAGNOSIS — R1084 Generalized abdominal pain: Secondary | ICD-10-CM | POA: Diagnosis present

## 2022-12-09 DIAGNOSIS — N390 Urinary tract infection, site not specified: Secondary | ICD-10-CM | POA: Diagnosis not present

## 2022-12-09 DIAGNOSIS — Z79899 Other long term (current) drug therapy: Secondary | ICD-10-CM | POA: Diagnosis not present

## 2022-12-09 DIAGNOSIS — Z8701 Personal history of pneumonia (recurrent): Secondary | ICD-10-CM | POA: Diagnosis not present

## 2022-12-09 DIAGNOSIS — K529 Noninfective gastroenteritis and colitis, unspecified: Secondary | ICD-10-CM | POA: Diagnosis present

## 2022-12-09 DIAGNOSIS — Z9641 Presence of insulin pump (external) (internal): Secondary | ICD-10-CM | POA: Diagnosis present

## 2022-12-09 DIAGNOSIS — I509 Heart failure, unspecified: Secondary | ICD-10-CM | POA: Diagnosis present

## 2022-12-09 DIAGNOSIS — Z91010 Allergy to peanuts: Secondary | ICD-10-CM | POA: Diagnosis not present

## 2022-12-09 DIAGNOSIS — R112 Nausea with vomiting, unspecified: Secondary | ICD-10-CM | POA: Diagnosis not present

## 2022-12-09 DIAGNOSIS — K219 Gastro-esophageal reflux disease without esophagitis: Secondary | ICD-10-CM | POA: Diagnosis present

## 2022-12-09 DIAGNOSIS — E1043 Type 1 diabetes mellitus with diabetic autonomic (poly)neuropathy: Secondary | ICD-10-CM | POA: Diagnosis present

## 2022-12-09 DIAGNOSIS — Z794 Long term (current) use of insulin: Secondary | ICD-10-CM | POA: Diagnosis not present

## 2022-12-09 DIAGNOSIS — Z83438 Family history of other disorder of lipoprotein metabolism and other lipidemia: Secondary | ICD-10-CM | POA: Diagnosis not present

## 2022-12-09 DIAGNOSIS — Z833 Family history of diabetes mellitus: Secondary | ICD-10-CM | POA: Diagnosis not present

## 2022-12-09 DIAGNOSIS — Z9049 Acquired absence of other specified parts of digestive tract: Secondary | ICD-10-CM | POA: Diagnosis not present

## 2022-12-09 LAB — URINALYSIS, ROUTINE W REFLEX MICROSCOPIC
Bilirubin Urine: NEGATIVE
Glucose, UA: >=500 mg/dL — AB
Hgb urine dipstick: NEGATIVE
Ketones, ur: 80 mg/dL — AB
Leukocytes,Ua: NEGATIVE
Nitrite: NEGATIVE
Protein, ur: NEGATIVE mg/dL
Specific Gravity, Urine: 1.018 (ref 1.005–1.030)
pH: 6 (ref 5.0–8.0)

## 2022-12-09 LAB — PREGNANCY, URINE: Preg Test, Ur: NEGATIVE

## 2022-12-09 LAB — CBC WITH DIFFERENTIAL/PLATELET
Abs Immature Granulocytes: 0.14 10*3/uL — ABNORMAL HIGH (ref 0.00–0.07)
Basophils Absolute: 0 10*3/uL (ref 0.0–0.1)
Basophils Relative: 0 %
Eosinophils Absolute: 0 10*3/uL (ref 0.0–0.5)
Eosinophils Relative: 0 %
HCT: 44.3 % (ref 36.0–46.0)
Hemoglobin: 14.4 g/dL (ref 12.0–15.0)
Immature Granulocytes: 1 %
Lymphocytes Relative: 2 %
Lymphs Abs: 0.6 10*3/uL — ABNORMAL LOW (ref 0.7–4.0)
MCH: 30.9 pg (ref 26.0–34.0)
MCHC: 32.5 g/dL (ref 30.0–36.0)
MCV: 95.1 fL (ref 80.0–100.0)
Monocytes Absolute: 0.3 10*3/uL (ref 0.1–1.0)
Monocytes Relative: 1 %
Neutro Abs: 23.9 10*3/uL — ABNORMAL HIGH (ref 1.7–7.7)
Neutrophils Relative %: 96 %
Platelets: 298 10*3/uL (ref 150–400)
RBC: 4.66 MIL/uL (ref 3.87–5.11)
RDW: 13.2 % (ref 11.5–15.5)
WBC: 25 10*3/uL — ABNORMAL HIGH (ref 4.0–10.5)
nRBC: 0 % (ref 0.0–0.2)

## 2022-12-09 LAB — COMPREHENSIVE METABOLIC PANEL
ALT: 14 U/L (ref 0–44)
AST: 26 U/L (ref 15–41)
Albumin: 4.7 g/dL (ref 3.5–5.0)
Alkaline Phosphatase: 107 U/L (ref 38–126)
Anion gap: 16 — ABNORMAL HIGH (ref 5–15)
BUN: 16 mg/dL (ref 6–20)
CO2: 13 mmol/L — ABNORMAL LOW (ref 22–32)
Calcium: 9.1 mg/dL (ref 8.9–10.3)
Chloride: 107 mmol/L (ref 98–111)
Creatinine, Ser: 0.87 mg/dL (ref 0.44–1.00)
GFR, Estimated: >60 mL/min (ref >60)
Glucose, Bld: 333 mg/dL — ABNORMAL HIGH (ref 70–99)
Potassium: 4.7 mmol/L (ref 3.5–5.1)
Sodium: 136 mmol/L (ref 135–145)
Total Bilirubin: 1.2 mg/dL — ABNORMAL HIGH (ref <1.2)
Total Protein: 7.8 g/dL (ref 6.5–8.1)

## 2022-12-09 LAB — HEMOGLOBIN A1C
Hgb A1c MFr Bld: 7.4 % — ABNORMAL HIGH (ref 4.8–5.6)
Mean Plasma Glucose: 165.68 mg/dL

## 2022-12-09 LAB — CBG MONITORING, ED
Glucose-Capillary: 271 mg/dL — ABNORMAL HIGH (ref 70–99)
Glucose-Capillary: 308 mg/dL — ABNORMAL HIGH (ref 70–99)
Glucose-Capillary: 278 mg/dL — ABNORMAL HIGH (ref 70–99)
Glucose-Capillary: 401 mg/dL — ABNORMAL HIGH (ref 70–99)

## 2022-12-09 LAB — GLUCOSE, CAPILLARY
Glucose-Capillary: 320 mg/dL — ABNORMAL HIGH (ref 70–99)
Glucose-Capillary: 211 mg/dL — ABNORMAL HIGH (ref 70–99)
Glucose-Capillary: 191 mg/dL — ABNORMAL HIGH (ref 70–99)
Glucose-Capillary: 151 mg/dL — ABNORMAL HIGH (ref 70–99)

## 2022-12-09 LAB — MAGNESIUM: Magnesium: 1.8 mg/dL (ref 1.7–2.4)

## 2022-12-09 MED ORDER — ACETAMINOPHEN 650 MG RE SUPP: 650.0000 mg | Freq: Four times a day (QID) | RECTAL | Status: DC | PRN

## 2022-12-09 MED ORDER — LORAZEPAM 2 MG/ML IJ SOLN
1.0000 mg | Freq: Once | INTRAMUSCULAR | Status: AC
  Administered 2022-12-09: 1 mg via INTRAVENOUS
  Filled 2022-12-09: qty 1

## 2022-12-09 MED ORDER — ONDANSETRON HCL 4 MG/2ML IJ SOLN: 4.0000 mg | Freq: Four times a day (QID) | INTRAMUSCULAR | Status: DC | PRN

## 2022-12-09 MED ORDER — HYDROMORPHONE HCL 1 MG/ML IJ SOLN
0.5000 mg | INTRAMUSCULAR | Status: DC | PRN
  Administered 2022-12-09 – 2022-12-11 (×13): 1 mg via INTRAVENOUS
  Filled 2022-12-09 (×13): qty 1

## 2022-12-09 MED ORDER — METOCLOPRAMIDE HCL 5 MG/ML IJ SOLN
5.0000 mg | Freq: Once | INTRAMUSCULAR | Status: AC
  Administered 2022-12-09: 5 mg via INTRAVENOUS
  Filled 2022-12-09: qty 2

## 2022-12-09 MED ORDER — ALBUTEROL SULFATE (2.5 MG/3ML) 0.083% IN NEBU: 2.5000 mg | INHALATION_SOLUTION | RESPIRATORY_TRACT | Status: DC | PRN

## 2022-12-09 MED ORDER — INSULIN PUMP
Freq: Three times a day (TID) | SUBCUTANEOUS | Status: DC
  Administered 2022-12-10: 1.1 via SUBCUTANEOUS
  Administered 2022-12-10: 2.3 via SUBCUTANEOUS
  Administered 2022-12-10: 1.37 via SUBCUTANEOUS
  Filled 2022-12-09: qty 1

## 2022-12-09 MED ORDER — ONDANSETRON HCL 4 MG/2ML IJ SOLN
4.0000 mg | Freq: Once | INTRAMUSCULAR | Status: AC
  Administered 2022-12-09: 4 mg via INTRAVENOUS
  Filled 2022-12-09: qty 2

## 2022-12-09 MED ORDER — PANTOPRAZOLE SODIUM 40 MG IV SOLR
40.0000 mg | INTRAVENOUS | Status: DC
  Administered 2022-12-09 – 2022-12-11 (×3): 40 mg via INTRAVENOUS
  Filled 2022-12-09 (×3): qty 10

## 2022-12-09 MED ORDER — METOCLOPRAMIDE HCL 5 MG/ML IJ SOLN: 10.0000 mg | Freq: Four times a day (QID) | INTRAMUSCULAR | Status: DC | PRN

## 2022-12-09 MED ORDER — ACETAMINOPHEN 325 MG PO TABS: 650.0000 mg | ORAL_TABLET | Freq: Four times a day (QID) | ORAL | Status: DC | PRN

## 2022-12-09 MED ORDER — LORAZEPAM 2 MG/ML IJ SOLN: 1.0000 mg | INTRAMUSCULAR | Status: DC | PRN

## 2022-12-09 MED ORDER — ACETAMINOPHEN 650 MG RE SUPP
650.0000 mg | Freq: Four times a day (QID) | RECTAL | Status: DC | PRN
Start: 2022-12-09 — End: 2022-12-09

## 2022-12-09 MED ORDER — INSULIN ASPART 100 UNIT/ML IJ SOLN
0.0000 [IU] | INTRAMUSCULAR | Status: DC
Start: 2022-12-09 — End: 2022-12-09
  Administered 2022-12-09: 7 [IU] via SUBCUTANEOUS
  Administered 2022-12-09 (×2): 5 [IU] via SUBCUTANEOUS
  Administered 2022-12-09: 9 [IU] via SUBCUTANEOUS
  Filled 2022-12-09: qty 0.09

## 2022-12-09 MED ORDER — ACETAMINOPHEN 325 MG PO TABS
650.0000 mg | ORAL_TABLET | Freq: Four times a day (QID) | ORAL | Status: DC | PRN
Start: 2022-12-09 — End: 2022-12-09

## 2022-12-09 MED ORDER — PROMETHAZINE (PHENERGAN) 6.25MG IN NS 50ML IVPB
6.2500 mg | Freq: Four times a day (QID) | INTRAVENOUS | Status: DC | PRN
  Administered 2022-12-09 – 2022-12-10 (×3): 6.25 mg via INTRAVENOUS
  Filled 2022-12-09: qty 50
  Filled 2022-12-09 (×3): qty 6.25

## 2022-12-09 MED ORDER — LACTATED RINGERS IV SOLN: INTRAVENOUS | Status: AC

## 2022-12-09 MED ORDER — ENOXAPARIN SODIUM 40 MG/0.4ML IJ SOSY
40.0000 mg | PREFILLED_SYRINGE | INTRAMUSCULAR | Status: DC
  Administered 2022-12-09 – 2022-12-11 (×3): 40 mg via SUBCUTANEOUS
  Filled 2022-12-09 (×3): qty 0.4

## 2022-12-09 MED ORDER — HYDRALAZINE HCL 20 MG/ML IJ SOLN: 5.0000 mg | Freq: Four times a day (QID) | INTRAMUSCULAR | Status: DC | PRN

## 2022-12-09 NOTE — Plan of Care (Signed)
  Problem: Coping: Goal: Ability to adjust to condition or change in health will improve Outcome: Progressing   Problem: Health Behavior/Discharge Planning: Goal: Ability to identify and utilize available resources and services will improve Outcome: Progressing Goal: Ability to manage health-related needs will improve Outcome: Progressing   Problem: Skin Integrity: Goal: Risk for impaired skin integrity will decrease Outcome: Progressing   Problem: Tissue Perfusion: Goal: Adequacy of tissue perfusion will improve Outcome: Progressing   Problem: Clinical Measurements: Goal: Will remain free from infection Outcome: Progressing

## 2022-12-09 NOTE — Inpatient Diabetes Management (Addendum)
Inpatient Diabetes Program Recommendations  AACE/ADA: New Consensus Statement on Inpatient Glycemic Control (2015)  Target Ranges:  Prepandial:   less than 140 mg/dL      Peak postprandial:   less than 180 mg/dL (1-2 hours)      Critically ill patients:  140 - 180 mg/dL   Lab Results  Component Value Date   GLUCAP 320 (H) 12/09/2022   HGBA1C 7.4 (H) 12/09/2022    Review of Glycemic Control  Diabetes history: DM1 Outpatient Diabetes medications: Tandum T-slim Insulin pump with Dexcom CGM Current orders for Inpatient glycemic control: Novolog 0-9 Q4H  HgbA1C - 7.4% Endo - Crista Curb, MD  Insulin pump settings: Basal 0.95 I:C - 9 ISF 40  On admission, CO2 13, AG 16, No BHB Will need basal insulin if pump is not restarted in next couple of hours.   Inpatient Diabetes Program Recommendations:    Please order "Insulin pump order set"  Spoke with pt at bedside regarding her diabetes and insulin pump. Pt states when she was nauseated earlier today, her pump came off in the ED. Said her family will bring her supplies to restart pump in the next few hours. Pt has hx gastroparesis. Has fairly good control glycemic control with insulin pump and Dexcom. Has no issues with getting supplies.  Secure text sent to MD regarding orders for insulin pump. Informed RN that pt's family will be bringing her pump supplies and MD will likely place insulin pump order set.   If pump cannot be restarted for any reason, pt will need Semglee 16 units Q24H starting tonight. Pt is Type 1 and needs both basal and bolus insulin if not on insulin pump.  F/U in am.  Thank you. Ailene Ards, RD, LDN, CDCES Inpatient Diabetes Coordinator [phone]

## 2022-12-09 NOTE — H&P (Signed)
History and Physical  [name] ONG:[id number] DOB: 01/20/1990 DOA: 12/08/2022  PCP: Elizabeth Palau, FNP   Chief Complaint: Abdominal pain, vomiting  HPI: [name] is a 32 y.o. female with medical history significant for type 1 diabetes with insulin pump, GERD, gastroparesis, hypertension being admitted to the hospital with abdominal pain and intractable nausea and vomiting likely due to gastroparesis.  Patient states that she has been having diffuse nonradiating epigastric abdominal pain with associated nausea and vomiting for the last couple of days.  Has been unable to keep anything down.  Denies any fever, having some diarrhea.  No sick contacts.  States that her symptoms are very reminiscent of her gastroparesis.  During her vomiting, her insulin pump came off here in the emergency department.  Review of Systems: Please see HPI for pertinent positives and negatives. A complete 10 system review of systems are otherwise negative.  Past Medical History:  Diagnosis Date   Anxiety    Arthritis    "hands, feet, knees" (12/18/2016)   Asthma    Diabetic gastroparesis (HCC)    Per gastric emptying study 07/09/16 which showed significant delayed gastric emptying.   Gallstones    Gastroparesis    GERD (gastroesophageal reflux disease)    Heart murmur    Hepatic steatosis 11/26/2014   and hepatomegaly   Hypertension    hx (12/18/2016)   Intractable cyclical vomiting syndrome    /notes 12/18/2016   Liver mass 11/26/2014   Pancreatitis, acute 11/26/2014   Pneumonia    "as a teen X 1" (12/18/2016)   Type I diabetes mellitus (HCC) 2007   IDDM.  poorly controlled, multiple admits with DKA   Past Surgical History:  Procedure Laterality Date   CHOLECYSTECTOMY N/A 02/11/2015   Procedure: LAPAROSCOPIC CHOLECYSTECTOMY WITH INTRAOPERATIVE CHOLANGIOGRAM;  Surgeon: Gaynelle Adu, MD;  Location: [facility];  Service: General;  Laterality: N/A;   ESOPHAGOGASTRODUODENOSCOPY (EGD) WITH  PROPOFOL Left 09/20/2014   Procedure: ESOPHAGOGASTRODUODENOSCOPY (EGD) WITH PROPOFOL;  Surgeon: Willis Modena, MD;  Location: [facility] ENDOSCOPY;  Service: Endoscopy;  Laterality: Left;   ESOPHAGOGASTRODUODENOSCOPY (EGD) WITH PROPOFOL N/A 03/31/2019   Procedure: ESOPHAGOGASTRODUODENOSCOPY (EGD) WITH PROPOFOL;  Surgeon: Bernette Redbird, MD;  Location: [facility] ENDOSCOPY;  Service: Endoscopy;  Laterality: N/A;   WISDOM TOOTH EXTRACTION      Social History:  reports that she has never smoked. She has never used smokeless tobacco. She reports that she does not currently use drugs after having used the following drugs: Marijuana. She reports that she does not drink alcohol.   Allergies  Allergen Reactions   Peanut-Containing Drug Products Swelling and Other (See Comments)    Reaction:  Swelling of mouth and lips    Food Swelling and Other (See Comments)    Pt is allergic to strawberries.   Reaction:  Swelling of mouth and lips    Ultram [Tramadol] Itching    Family History  Problem Relation Age of Onset   Heart disease Maternal Grandmother    Heart disease Maternal Grandfather    Diabetes Mother    Hyperlipidemia Mother    Hypertension Father    Heart disease Father    Hypertension Paternal Grandmother    Cancer Paternal Grandfather      Prior to Admission medications   Medication Sig Start Date End Date Taking? Authorizing Provider  albuterol (PROVENTIL HFA;VENTOLIN HFA) 108 (90 Base) MCG/ACT inhaler Inhale 1-2 puffs into the lungs every 6 (six) hours as needed for wheezing or shortness of breath.   Yes [provider]  ferrous sulfate 324 (65 Fe) MG TBEC Take 1 tablet by mouth every other day. 02/08/19  Yes [provider]  loperamide (IMODIUM) 2 MG capsule Take 2 mg by mouth 4 (four) times daily as needed for diarrhea or loose stools. 10/29/22  Yes [provider]  blood glucose meter kit and supplies Dispense based on patient and insurance preference. Use up to four  times daily as directed. (FOR ICD-10 E10.9, E11.9). 12/07/17   Rodolph Bong, MD  gabapentin (NEURONTIN) 100 MG capsule Take 1 capsule (100 mg total) by mouth 3 (three) times daily. 04/17/19 05/17/19  Darlin Drop, DO  glucagon (GLUCAGON EMERGENCY) 1 MG injection Inject 1 mg into the muscle once as needed (For very low blood sugars. May repeat in 15-20 minutes if needed.). 12/20/16   Hongalgi, Maximino Greenland, MD  Heparin Lock Flush (HEPARIN FLUSH) 10 UNIT/ML SOLN injection 1 mL (10 Units total) by Intracatheter route every 30 (thirty) days. Pt needs to have port flushed with heparin once a month, please follow with PCP Cherylann Ratel for further instruction 06/30/19   Simeon Craft, PA-C  insulin aspart (NOVOLOG) 100 UNIT/ML FlexPen Inject 4 Units into the skin 3 (three) times daily with meals. 04/17/19   Darlin Drop, DO  insulin glargine (LANTUS) 100 UNIT/ML Solostar Pen Inject 16 Units into the skin daily. 04/17/19   Darlin Drop, DO  Insulin Pen Needle 31G X 5 MM MISC 30 Units by Does not apply route at bedtime. 12/07/17   Rodolph Bong, MD  lisinopril (ZESTRIL) 2.5 MG tablet Take 2.5 mg by mouth daily.    [provider]  metoCLOPramide (REGLAN) 10 MG tablet Take 1 tablet (10 mg total) by mouth 4 (four) times daily -  before meals and at bedtime. 04/17/19   Darlin Drop, DO  pantoprazole (PROTONIX) 40 MG tablet Take 1 tablet (40 mg total) by mouth daily. 04/17/19 05/17/19  Darlin Drop, DO  promethazine (PHENERGAN) 25 MG suppository Place 1 suppository (25 mg total) rectally every 6 (six) hours as needed for nausea. 03/15/19   Derwood Kaplan, MD  promethazine (PHENERGAN) 25 MG tablet Take 1 tablet (25 mg total) by mouth every 6 (six) hours as needed for nausea or vomiting. 03/17/22   Mannie Stabile, PA-C  traZODone (DESYREL) 50 MG tablet Take 50 mg by mouth at bedtime.  11/30/18   [provider]    Physical Exam: BP 121/73 (BP Location: Left Arm)   Pulse (!) 114    Temp 99 F (37.2 C) (Oral)   Resp 20   Ht 5\' 3"  (1.6 m)   Wt 63.5 kg   SpO2 100%   BMI 24.80 kg/m   General:  Alert, oriented, calm, in no acute distress, holding emesis bag Cardiovascular: RRR, no murmurs or rubs, no peripheral edema  Respiratory: clear to auscultation bilaterally, no wheezes, no crackles  Abdomen: soft, tender with voluntary guarding, nondistended, normal bowel tones heard  Skin: dry, no rashes  Musculoskeletal: no joint effusions, normal range of motion  Psychiatric: appropriate affect, normal speech  Neurologic: extraocular muscles intact, clear speech, moving all extremities with intact sensorium         Labs on Admission:  Basic Metabolic Panel: Recent Labs  Lab 12/08/22 2109 12/09/22 0552  NA 136 136  K 3.9 4.7  CL 103 107  CO2 20* 13*  GLUCOSE 302* 333*  BUN 14 16  CREATININE 0.71 0.87  CALCIUM 9.2 9.1  MG  --  1.8   Liver Function Tests: Recent Labs  Lab 12/09/22 0552  AST 26  ALT 14  ALKPHOS 107  BILITOT 1.2*  PROT 7.8  ALBUMIN 4.7   No results for input(s): "LIPASE", "AMYLASE" in the last 168 hours. No results for input(s): "AMMONIA" in the last 168 hours. CBC: Recent Labs  Lab 12/08/22 2109 12/09/22 0552  WBC 20.8* 25.0*  NEUTROABS 18.5* 23.9*  HGB 12.5 14.4  HCT 37.7 44.3  MCV 92.0 95.1  PLT 280 298   Cardiac Enzymes: No results for input(s): "CKTOTAL", "CKMB", "CKMBINDEX", "TROPONINI" in the last 168 hours.  BNP (last 3 results) No results for input(s): "BNP" in the last 8760 hours.  ProBNP (last 3 results) No results for input(s): "PROBNP" in the last 8760 hours.  CBG: Recent Labs  Lab 12/08/22 1935 12/09/22 0440 12/09/22 0515  GLUCAP 203* 308* 271*    Radiological Exams on Admission: No results found.  Assessment/Plan [name] is a 32 y.o. female with medical history significant for type 1 diabetes with insulin pump, GERD, gastroparesis, hypertension being admitted to the hospital with  abdominal pain and intractable nausea and vomiting likely due to gastroparesis.   Intractable nausea and vomiting-without complicating or concerning factors, likely due to flare of her gastroparesis.  -Observation admission -IV fluids -Pain and nausea control as needed -Oral diet as tolerated, clear liquids for now  Type 1 diabetes-not well-controlled, last A1c 7.8 in 2021.  Currently with hyperglycemia but no evidence of acidosis. -Update A1c -Carb controlled diet when eating -Monitor every 4 hour insulin, with sliding scale  Leukocytosis-likely reactive, monitor with daily labs  GERD-IV Protonix until able to tolerate p.o.  Hypertension-hold lisinopril while actively vomiting, IV hydralazine as needed  DVT prophylaxis: Lovenox     Code Status: Full Code  Consults called: None  Admission status: Observation  Time spent: 46 minutes  Kerstie Agent Sharlette Dense MD [facility] Pager [phone]  If 7PM-7AM, please contact night-coverage [email or web address] Password [facility]  12/09/2022, 7:46 AM

## 2022-12-09 NOTE — ED Notes (Signed)
ED TO INPATIENT HANDOFF REPORT  Name/Age/Gender [name] 32 y.o. female  Code Status    Code Status Orders  (From admission, onward)           Start     Ordered   12/09/22 0746  Full code  Continuous       Question:  By:  Answer:  Consent: discussion documented in EHR   12/09/22 0746           Code Status History     Date Active Date Inactive Code Status Order ID Comments User Context   12/09/2022 0500 12/09/2022 0746 Full Code [id number]  Angie Fava, DO ED   03/16/2019 1708 04/17/2019 1601 Full Code [id number]  Lorin Glass, MD Inpatient   02/26/2019 1858 03/06/2019 1629 Full Code [id number]  Lorin Glass, MD Inpatient   02/18/2019 1733 02/23/2019 2246 Full Code [id number]  Burnadette Pop, MD ED   01/15/2019 0827 01/20/2019 1701 Full Code [id number]  Zannie Cove, MD ED   12/24/2018 1635 12/29/2018 1616 Full Code [id number]  Kathlen Mody, MD Inpatient   11/21/2018 1949 11/23/2018 1726 Full Code [id number]  Jacques Navy, MD ED   11/13/2018 1912 11/18/2018 2111 Full Code [id number]  Marzetta Board, MD Inpatient   11/07/2018 1647 11/11/2018 1922 Full Code [id number]  Eddie North, MD Inpatient   11/04/2018 2355 11/07/2018 1647 Full Code [id number]  Eduard Clos, MD Inpatient   10/06/2018 1127 10/16/2018 1441 Full Code [id number]  Teddy Spike, DO ED   10/04/2018 2002 10/05/2018 1405 Full Code [id number]  Anselm Jungling, DO ED   12/22/2017 1757 12/27/2017 2015 Full Code [id number]  Coralie Keens, MD Inpatient   12/19/2017 1737 12/21/2017 1403 Full Code [id number]  Myrtie Neither, MD Inpatient   12/08/2017 1340 12/11/2017 1618 Full Code [id number]  Dimple Nanas, MD ED   11/29/2017 0635 12/07/2017 1840 Full Code [id number]  Eduard Clos, MD Inpatient   11/15/2017 0503 11/21/2017 1747 Full Code [id number]  Eduard Clos, MD Inpatient   11/02/2017 1757 11/07/2017 1354 Full Code [id number]  Marinda Elk, MD Inpatient    01/21/2017 2304 01/29/2017 1728 Full Code [id number]  Eduard Clos, MD ED   12/18/2016 0704 12/20/2016 1727 Full Code [id number]  Marcos Eke, PA-C ED   11/05/2016 1525 11/11/2016 1456 Full Code [id number]  Haydee Monica, MD Inpatient   10/23/2016 1342 10/29/2016 2103 Full Code [id number]  Haydee Salter, MD ED   09/26/2016 1503 09/29/2016 1533 Full Code [id number]  Alwyn Ren, MD ED   07/15/2016 1712 07/18/2016 2035 Full Code [id number]  Dimple Nanas, MD Inpatient   07/05/2016 1658 07/11/2016 1620 Full Code [id number]  Osvaldo Shipper, MD Inpatient   06/23/2016 0856 06/28/2016 1346 Full Code [id number]  Renae Fickle, MD Inpatient   06/12/2016 2137 06/14/2016 1723 Full Code [id number]  Inez Catalina, MD Inpatient   06/03/2016 2044 06/06/2016 2041 Full Code [id number]  Leda Gauze, NP Inpatient   05/31/2016 2348 06/03/2016 2044 Full Code [id number]  Eduard Clos, MD Inpatient   05/14/2016 0212 05/16/2016 1718 Full Code [id number]  Therisa Doyne, MD Inpatient   05/04/2016 2140 05/10/2016 2004 Full Code [id number]  Catarina Hartshorn, MD Inpatient   02/29/2016 2201 03/04/2016 1646 Full Code [id number]  Therisa Doyne, MD Inpatient   02/20/2016 1441 02/25/2016 1723 Full Code [id number]  Elgergawy, Leana Roe, MD Inpatient   01/22/2016 1021 01/25/2016 1607 Full Code [id number]  Catarina Hartshorn, MD Inpatient   01/17/2016  6578 01/19/2016 1944 Full Code [id number]  Hillary Bow, DO ED   11/18/2015 2238 11/23/2015 1231 Full Code [id number]  Osvaldo Shipper, MD Inpatient   11/09/2015 2048 11/12/2015 1535 Full Code [id number]  Therisa Doyne, MD Inpatient   11/09/2015 2048 11/09/2015 2048 Full Code [id number]  Therisa Doyne, MD Inpatient   11/03/2015 0238 11/06/2015 1301 Full Code [id number]  Pearson Grippe, MD Inpatient   11/02/2015 0128 11/03/2015 0238 Full Code [id number]  Haydee Monica, MD Inpatient   07/10/2015 1645 07/13/2015 1431 Full Code [id number]  Lorretta Harp, MD ED   07/01/2015 2203  07/06/2015 1522 Full Code [id number]  Eston Esters, MD ED   06/22/2015 0624 06/24/2015 1531 Full Code [id number]  Hillary Bow, DO Inpatient   06/10/2015 0156 06/11/2015 1519 Full Code [id number]  Bobette Mo, MD Inpatient   05/28/2015 1609 06/03/2015 1932 Full Code [id number]  Vassie Loll, MD Inpatient   05/24/2015 2359 05/25/2015 1858 Full Code [id number]  Ron Parker, MD Inpatient   04/21/2015 0931 04/23/2015 1445 Full Code [id number]  Eston Esters, MD ED   04/08/2015 2229 04/15/2015 1605 Full Code [id number]  Bobette Mo, MD ED   03/22/2015 0243 03/25/2015 1811 Full Code [id number]  Eduard Clos, MD ED   02/11/2015 1530 02/12/2015 1805 Full Code [id number]  Gaynelle Adu, MD Inpatient   11/24/2014 1309 11/29/2014 1929 Full Code [id number]  Rama, Maryruth Bun, MD Inpatient   09/16/2014 0530 09/21/2014 2007 Full Code [id number]  Hillary Bow, DO ED   06/29/2012 1400 07/01/2012 1517 Full Code [id number]  Penny Pia, MD Inpatient   12/08/2011 1543 12/12/2011 1450 Full Code [id number]  Evans Lance, RN Inpatient       Home/SNF/Other Home  Chief Complaint Intractable nausea and vomiting [R11.2]  Level of Care/Admitting Diagnosis ED Disposition     ED Disposition  Admit   Condition  --   Comment  Hospital Area: [facility] [[id number]]  Level of Care: Telemetry [5]  Admit to tele based on following criteria: Monitor for Ischemic changes  May admit patient to [facility] or [facility] if equivalent level of care is available:: No  Covid Evaluation: Asymptomatic - no recent exposure (last 10 days) testing not required  Diagnosis: Intractable nausea and vomiting [720114]  Admitting Physician: Angie Fava [[id number]]  Attending Physician: [facility] [place] [[id number]]  Certification:: I certify this patient will need inpatient services for at least 2 midnights  Expected Medical Readiness: 12/11/2022          Medical History Past Medical History:  Diagnosis  Date   Anxiety    Arthritis    "hands, feet, knees" (12/18/2016)   Asthma    Diabetic gastroparesis (HCC)    Per gastric emptying study 07/09/16 which showed significant delayed gastric emptying.   Gallstones    Gastroparesis    GERD (gastroesophageal reflux disease)    Heart murmur    Hepatic steatosis 11/26/2014   and hepatomegaly   Hypertension    hx (12/18/2016)   Intractable cyclical vomiting syndrome    /notes 12/18/2016   Liver mass 11/26/2014   Pancreatitis, acute 11/26/2014   Pneumonia    "as a teen X 1" (12/18/2016)   Type I diabetes mellitus (HCC) 2007   IDDM.  poorly controlled, multiple admits with DKA    Allergies Allergies  Allergen Reactions   Peanut-Containing Drug Products Swelling and Other (See Comments)    Reaction:  Swelling of mouth and lips  Food Swelling and Other (See Comments)    Pt is allergic to strawberries.   Reaction:  Swelling of mouth and lips    Ultram [Tramadol] Itching    IV Location/Drains/Wounds Patient Lines/Drains/Airways Status     Active Line/Drains/Airways     Name Placement date Placement time Site Days   Peripheral IV 12/08/22 20 G 1.88" Anterior;Right;Upper Arm 12/08/22  2116  Arm  1   Peripheral IV 12/08/22 20 G Anterior;Left Forearm 12/08/22  2225  Forearm  1   Peripheral IV 12/09/22 20 G Right Forearm 12/09/22  0600  Forearm  less than 1            Labs/Imaging Results for orders placed or performed during the hospital encounter of 12/08/22 (from the past 48 hour(s))  Pregnancy, urine     Status: None   Collection Time: 12/08/22 11:50 AM  Result Value Ref Range   Preg Test, Ur NEGATIVE NEGATIVE    Comment:        THE SENSITIVITY OF THIS METHODOLOGY IS >25 mIU/mL. Performed at [facility], [address], [place], [place] [address]   CBG monitoring, ED     Status: Abnormal   Collection Time: 12/08/22  7:35 PM  Result Value Ref Range   Glucose-Capillary 203 (H) 70 - 99 mg/dL     Comment: Glucose reference range applies only to samples taken after fasting for at least 8 hours.  Basic metabolic panel     Status: Abnormal   Collection Time: 12/08/22  9:09 PM  Result Value Ref Range   Sodium 136 135 - 145 mmol/L   Potassium 3.9 3.5 - 5.1 mmol/L   Chloride 103 98 - 111 mmol/L   CO2 20 (L) 22 - 32 mmol/L   Glucose, Bld 302 (H) 70 - 99 mg/dL    Comment: Glucose reference range applies only to samples taken after fasting for at least 8 hours.   BUN 14 6 - 20 mg/dL   Creatinine, Ser 1.09 0.44 - 1.00 mg/dL   Calcium 9.2 8.9 - 32.3 mg/dL   GFR, Estimated >55 >73 mL/min    Comment: (NOTE) Calculated using the CKD-EPI Creatinine Equation (2021)    Anion gap 13 5 - 15    Comment: Performed at [facility], [address], [place], [place] [address]  CBC with Differential     Status: Abnormal   Collection Time: 12/08/22  9:09 PM  Result Value Ref Range   WBC 20.8 (H) 4.0 - 10.5 K/uL   RBC 4.10 3.87 - 5.11 MIL/uL   Hemoglobin 12.5 12.0 - 15.0 g/dL   HCT 42.7 06.2 - 37.6 %   MCV 92.0 80.0 - 100.0 fL   MCH 30.5 26.0 - 34.0 pg   MCHC 33.2 30.0 - 36.0 g/dL   RDW 28.3 15.1 - 76.1 %   Platelets 280 150 - 400 K/uL   nRBC 0.0 0.0 - 0.2 %   Neutrophils Relative % 90 %   Neutro Abs 18.5 (H) 1.7 - 7.7 K/uL   Lymphocytes Relative 6 %   Lymphs Abs 1.3 0.7 - 4.0 K/uL   Monocytes Relative 4 %   Monocytes Absolute 0.9 0.1 - 1.0 K/uL   Eosinophils Relative 0 %   Eosinophils Absolute 0.0 0.0 - 0.5 K/uL   Basophils Relative 0 %   Basophils Absolute 0.1 0.0 - 0.1 K/uL   Immature Granulocytes 0 %   Abs Immature Granulocytes 0.08 (H) 0.00 - 0.07 K/uL  Comment: Performed at [facility], [address], [place], [place] [address]  Urinalysis, Routine w reflex microscopic -Urine, Clean Catch     Status: Abnormal   Collection Time: 12/08/22 11:50 PM  Result Value Ref Range   Color, Urine STRAW (A) YELLOW   APPearance CLEAR CLEAR   Specific  Gravity, Urine 1.018 1.005 - 1.030   pH 6.0 5.0 - 8.0   Glucose, UA >=500 (A) NEGATIVE mg/dL   Hgb urine dipstick NEGATIVE NEGATIVE   Bilirubin Urine NEGATIVE NEGATIVE   Ketones, ur 80 (A) NEGATIVE mg/dL   Protein, ur NEGATIVE NEGATIVE mg/dL   Nitrite NEGATIVE NEGATIVE   Leukocytes,Ua NEGATIVE NEGATIVE   RBC / HPF 0-5 0 - 5 RBC/hpf   WBC, UA 0-5 0 - 5 WBC/hpf   Bacteria, UA RARE (A) NONE SEEN   Squamous Epithelial / HPF 0-5 0 - 5 /HPF    Comment: Performed at [facility], [address] [place], [place] [address]  CBG monitoring, ED     Status: Abnormal   Collection Time: 12/09/22  4:40 AM  Result Value Ref Range   Glucose-Capillary 308 (H) 70 - 99 mg/dL    Comment: Glucose reference range applies only to samples taken after fasting for at least 8 hours.  CBG monitoring, ED     Status: Abnormal   Collection Time: 12/09/22  5:15 AM  Result Value Ref Range   Glucose-Capillary 271 (H) 70 - 99 mg/dL    Comment: Glucose reference range applies only to samples taken after fasting for at least 8 hours.  Magnesium     Status: None   Collection Time: 12/09/22  5:52 AM  Result Value Ref Range   Magnesium 1.8 1.7 - 2.4 mg/dL    Comment: Performed at [facility], [address] [place], [place] [address]  Hemoglobin A1c     Status: Abnormal   Collection Time: 12/09/22  5:52 AM  Result Value Ref Range   Hgb A1c MFr Bld 7.4 (H) 4.8 - 5.6 %    Comment: (NOTE) Pre diabetes:          5.7%-6.4%  Diabetes:              >6.4%  Glycemic control for   <7.0% adults with diabetes    Mean Plasma Glucose 165.68 mg/dL    Comment: Performed at [facility], [address] [place], [place] [address]  Comprehensive metabolic panel     Status: Abnormal   Collection Time: 12/09/22  5:52 AM  Result Value Ref Range   Sodium 136 135 - 145 mmol/L   Potassium 4.7 3.5 - 5.1 mmol/L   Chloride 107 98 - 111 mmol/L   CO2 13 (L) 22 - 32 mmol/L   Glucose, Bld  333 (H) 70 - 99 mg/dL    Comment: Glucose reference range applies only to samples taken after fasting for at least 8 hours.   BUN 16 6 - 20 mg/dL   Creatinine, Ser 9.51 0.44 - 1.00 mg/dL   Calcium 9.1 8.9 - 88.4 mg/dL   Total Protein 7.8 6.5 - 8.1 g/dL   Albumin 4.7 3.5 - 5.0 g/dL   AST 26 15 - 41 U/L   ALT 14 0 - 44 U/L   Alkaline Phosphatase 107 38 - 126 U/L   Total Bilirubin 1.2 (H) <1.2 mg/dL   GFR, Estimated >16 >60 mL/min    Comment: (NOTE) Calculated using the CKD-EPI Creatinine Equation (  2021)    Anion gap 16 (H) 5 - 15    Comment: Performed at [facility], [address], [place], [place] [address]  CBC with Differential/Platelet     Status: Abnormal   Collection Time: 12/09/22  5:52 AM  Result Value Ref Range   WBC 25.0 (H) 4.0 - 10.5 K/uL   RBC 4.66 3.87 - 5.11 MIL/uL   Hemoglobin 14.4 12.0 - 15.0 g/dL   HCT 32.4 40.1 - 02.7 %   MCV 95.1 80.0 - 100.0 fL   MCH 30.9 26.0 - 34.0 pg   MCHC 32.5 30.0 - 36.0 g/dL   RDW 25.3 66.4 - 40.3 %   Platelets 298 150 - 400 K/uL   nRBC 0.0 0.0 - 0.2 %   Neutrophils Relative % 96 %   Neutro Abs 23.9 (H) 1.7 - 7.7 K/uL   Lymphocytes Relative 2 %   Lymphs Abs 0.6 (L) 0.7 - 4.0 K/uL   Monocytes Relative 1 %   Monocytes Absolute 0.3 0.1 - 1.0 K/uL   Eosinophils Relative 0 %   Eosinophils Absolute 0.0 0.0 - 0.5 K/uL   Basophils Relative 0 %   Basophils Absolute 0.0 0.0 - 0.1 K/uL   Immature Granulocytes 1 %   Abs Immature Granulocytes 0.14 (H) 0.00 - 0.07 K/uL    Comment: Performed at [facility], [address] [place], [place] [address]  CBG monitoring, ED     Status: Abnormal   Collection Time: 12/09/22  8:20 AM  Result Value Ref Range   Glucose-Capillary 278 (H) 70 - 99 mg/dL    Comment: Glucose reference range applies only to samples taken after fasting for at least 8 hours.  CBG monitoring, ED     Status: Abnormal   Collection Time: 12/09/22  2:26 PM  Result Value Ref Range    Glucose-Capillary 401 (H) 70 - 99 mg/dL    Comment: Glucose reference range applies only to samples taken after fasting for at least 8 hours.   No results found.  Pending Labs Unresulted Labs (From admission, onward)     Start     Ordered   12/10/22 0500  Basic metabolic panel  Tomorrow morning,   R        12/09/22 0746   12/10/22 0500  CBC  Tomorrow morning,   R        12/09/22 0746            Vitals/Pain Today's Vitals   12/09/22 1127 12/09/22 1144 12/09/22 1414 12/09/22 1415  BP: 104/62   105/77  Pulse: (!) 119   (!) 114  Resp: 19   17  Temp:   98.7 F (37.1 C)   TempSrc:   Oral   SpO2: 100%   100%  Weight:      Height:      PainSc:  9       Isolation Precautions No active isolations  Medications Medications  LORazepam (ATIVAN) injection 1 mg (has no administration in time range)  lactated ringers infusion (0 mLs Intravenous Stopped 12/09/22 1349)  insulin aspart (novoLOG) injection 0-9 Units (9 Units Subcutaneous Given 12/09/22 1433)  pantoprazole (PROTONIX) injection 40 mg (40 mg Intravenous Given 12/09/22 0917)  enoxaparin (LOVENOX) injection 40 mg (40 mg Subcutaneous Given 12/09/22 0928)  acetaminophen (TYLENOL) tablet 650 mg (has no administration in time range)    Or  acetaminophen (TYLENOL) suppository 650 mg (has no administration in time range)  HYDROmorphone (DILAUDID) injection  0.5-1 mg (1 mg Intravenous Given 12/09/22 1148)  albuterol (PROVENTIL) (2.5 MG/3ML) 0.083% nebulizer solution 2.5 mg (has no administration in time range)  hydrALAZINE (APRESOLINE) injection 5 mg (has no administration in time range)  promethazine (PHENERGAN) 6.25 mg/NS 50 mL IVPB (6.25 mg Intravenous New Bag/Given 12/09/22 1351)  promethazine (PHENERGAN) injection 25 mg (25 mg Intramuscular Given 12/08/22 2002)  HYDROmorphone (DILAUDID) injection 1 mg (1 mg Intramuscular Given 12/08/22 2003)  sodium chloride 0.9 % bolus 1,000 mL (0 mLs Intravenous Stopped 12/09/22 0000)   droperidol (INAPSINE) 2.5 MG/ML injection 1.25 mg (1.25 mg Intravenous Given 12/08/22 2150)  sodium chloride 0.9 % bolus 1,000 mL (0 mLs Intravenous Stopped 12/09/22 0118)  metoCLOPramide (REGLAN) injection 10 mg (10 mg Intravenous Given 12/08/22 2240)  morphine (PF) 4 MG/ML injection 4 mg (4 mg Intravenous Given 12/09/22 0003)  LORazepam (ATIVAN) injection 1 mg (1 mg Intravenous Given 12/09/22 0219)  metoCLOPramide (REGLAN) injection 5 mg (5 mg Intravenous Given 12/09/22 0219)  ondansetron (ZOFRAN) injection 4 mg (4 mg Intravenous Given 12/09/22 0219)    Mobility walks

## 2022-12-09 NOTE — Progress Notes (Signed)
  Carryover admission to the Day Admitter.  I discussed this case with the EDP, Dr. Blinda Leatherwood.  Per these discussions:   This is a 32 year old female with type 1 diabetes complicated by gastroparesis, who is being admitted for intractable nausea/vomiting.   Blood sugar running slightly high in the low 300s, but no evidence of anion gap metabolic acidosis on initial CMP.  Continues to vomit and unable to tolerate p.o. after several doses of antiemetics in the ED.   Normally uses an insulin pump, although it is been shut off in the ED.  I have placed an order for observation to med/tele for further evaluation management of the above.  I have placed some additional preliminary admit orders via the adult multi-morbid admission order set. I have also ordered prn IV Zofran along with prn IV Ativan for nausea/vomiting refractory to Zofran.  Also ordered lactated Ringer's at 125 cc/h x 12 hours, hemoglobin A1c level, every 4 hours CBG monitoring with associated sliding scale insulin.  Have ordered updated CMP, including to evaluate for any interval development of anion gap metabolic acidosis, along with CBC and have added on a magnesium level.    Newton Pigg, DO Hospitalist

## 2022-12-09 NOTE — ED Notes (Signed)
MD notified of cbg of 401. Instructed to give 9 units insulin and recheck in an hour

## 2022-12-10 DIAGNOSIS — K529 Noninfective gastroenteritis and colitis, unspecified: Secondary | ICD-10-CM | POA: Diagnosis not present

## 2022-12-10 DIAGNOSIS — E1065 Type 1 diabetes mellitus with hyperglycemia: Secondary | ICD-10-CM

## 2022-12-10 LAB — CBC
HCT: 39.5 % (ref 36.0–46.0)
Hemoglobin: 12.8 g/dL (ref 12.0–15.0)
MCH: 30.1 pg (ref 26.0–34.0)
MCHC: 32.4 g/dL (ref 30.0–36.0)
MCV: 92.9 fL (ref 80.0–100.0)
Platelets: 276 10*3/uL (ref 150–400)
RBC: 4.25 MIL/uL (ref 3.87–5.11)
RDW: 13.6 % (ref 11.5–15.5)
WBC: 13.9 10*3/uL — ABNORMAL HIGH (ref 4.0–10.5)
nRBC: 0 % (ref 0.0–0.2)

## 2022-12-10 LAB — GLUCOSE, CAPILLARY
Glucose-Capillary: 115 mg/dL — ABNORMAL HIGH (ref 70–99)
Glucose-Capillary: 126 mg/dL — ABNORMAL HIGH (ref 70–99)
Glucose-Capillary: 154 mg/dL — ABNORMAL HIGH (ref 70–99)
Glucose-Capillary: 176 mg/dL — ABNORMAL HIGH (ref 70–99)
Glucose-Capillary: 183 mg/dL — ABNORMAL HIGH (ref 70–99)
Glucose-Capillary: 86 mg/dL (ref 70–99)

## 2022-12-10 LAB — BASIC METABOLIC PANEL
Anion gap: 10 (ref 5–15)
BUN: 19 mg/dL (ref 6–20)
CO2: 18 mmol/L — ABNORMAL LOW (ref 22–32)
Calcium: 9.1 mg/dL (ref 8.9–10.3)
Chloride: 108 mmol/L (ref 98–111)
Creatinine, Ser: 0.74 mg/dL (ref 0.44–1.00)
GFR, Estimated: >60 mL/min (ref >60)
Glucose, Bld: 168 mg/dL — ABNORMAL HIGH (ref 70–99)
Potassium: 3.8 mmol/L (ref 3.5–5.1)
Sodium: 136 mmol/L (ref 135–145)

## 2022-12-10 MED ORDER — SODIUM CHLORIDE 0.9 % IV SOLN: INTRAVENOUS | Status: AC

## 2022-12-10 MED ORDER — METOCLOPRAMIDE HCL 5 MG/ML IJ SOLN
10.0000 mg | Freq: Four times a day (QID) | INTRAMUSCULAR | Status: DC
  Administered 2022-12-10 – 2022-12-11 (×4): 10 mg via INTRAVENOUS
  Filled 2022-12-10 (×4): qty 2

## 2022-12-10 MED ORDER — SODIUM CHLORIDE 0.9 % IV BOLUS
500.0000 mL | Freq: Once | INTRAVENOUS | Status: AC
  Administered 2022-12-10: 500 mL via INTRAVENOUS

## 2022-12-10 NOTE — Plan of Care (Signed)

## 2022-12-10 NOTE — Plan of Care (Signed)
  Problem: Education: Goal: Ability to describe self-care measures that may prevent or decrease complications (Diabetes Survival Skills Education) will improve Outcome: Progressing   Problem: Coping: Goal: Ability to adjust to condition or change in health will improve Outcome: Progressing   Problem: Fluid Volume: Goal: Ability to maintain a balanced intake and output will improve Outcome: Progressing   Problem: Metabolic: Goal: Ability to maintain appropriate glucose levels will improve Outcome: Progressing   Problem: Tissue Perfusion: Goal: Adequacy of tissue perfusion will improve Outcome: Progressing   Problem: Education: Goal: Knowledge of General Education information will improve Description: Including pain rating scale, medication(s)/side effects and non-pharmacologic comfort measures Outcome: Progressing   Problem: Clinical Measurements: Goal: Will remain free from infection Outcome: Progressing Goal: Diagnostic test results will improve Outcome: Progressing

## 2022-12-10 NOTE — Progress Notes (Signed)
[facility] PROGRESS NOTE   [name] LKG:[id number] DOB: 05/05/1990 DOA: 12/08/2022  PCP: Elizabeth Palau, FNP  Brief History: 32 y.o. female with medical history significant for type 1 diabetes with insulin pump, GERD, gastroparesis, hypertension being admitted to the hospital with abdominal pain and intractable nausea and vomiting along with diarrhea.  Initially thought to be gastroparesis.  She likely had gastroenteritis to begin with.    Consultants: None  Procedures: None    Subjective/Interval History: Has not had any vomiting since last night.  No diarrhea since last night.  Abdominal pain is on and off.    Assessment/Plan:  Nausea vomiting and diarrhea, likely acute gastroenteritis/possible diabetic gastroparesis Diarrhea appears to be subsiding.  Abdomen is benign to examination.  Vomiting is also subsiding.  She is willing to try liquid diet today. She patient mentions that she does have gastroparesis but her symptoms have been much better so much so that she took herself off of metoclopramide a long time ago. Her acute gastroenteritis may have triggered gastroparesis will place her on metoclopramide for now.  This can be addressed in the outpatient setting.  Diabetes mellitus type 1, mild DKA Initial blood work did suggest DKA.  She has insulin pump.  She was hydrated.  Insulin pump was resumed.  Blood work from this morning shows improvement in anion gap and bicarbonate levels.  Did not require IV insulin infusion. We will give her IV fluids for the next 12 hours. Continue SSI.  Monitor CBGs as per insulin pump order set and contract.  Leukocytosis Could be reactive.  The UA did not show any infection.  She is afebrile.  Improvement noted today.  Continue to monitor for now.  Essential hypertension Holding lisinopril.  Borderline low blood pressures noted.   DVT Prophylaxis: Lovenox Code Status: Full code Family Communication: Discussed with  patient Disposition Plan: Anticipate discharge in 24 to 48 hours  Status is: Inpatient Remains inpatient appropriate because: Intractable nausea vomiting along with diarrhea      Medications: Scheduled:  enoxaparin (LOVENOX) injection  40 mg Subcutaneous Q24H   insulin pump   Subcutaneous TID WC, HS, 0200   metoCLOPramide (REGLAN) injection  10 mg Intravenous Q6H   pantoprazole (PROTONIX) IV  40 mg Intravenous Q24H   Continuous:  promethazine (PHENERGAN) injection (IM or IVPB) 6.25 mg (12/10/22 0849)   GUY:[id number] **OR** acetaminophen, albuterol, hydrALAZINE, HYDROmorphone (DILAUDID) injection, LORazepam, promethazine (PHENERGAN) injection (IM or IVPB)  Antibiotics: Anti-infectives (From admission, onward)    None       Objective:  Vital Signs  Vitals:   12/09/22 1617 12/09/22 2007 12/10/22 0016 12/10/22 0409  BP: 105/64 124/74 91/63 (!) 101/59  Pulse: (!) 105 (!) 106  100  Resp: 16 16 16 16   Temp: 98.7 F (37.1 C) 98.7 F (37.1 C) 98.4 F (36.9 C) 98.8 F (37.1 C)  TempSrc: Oral Oral Oral Oral  SpO2: 100% 100% 99% 99%  Weight: 58.4 kg     Height: 5\' 3"  (1.6 m)       Intake/Output Summary (Last 24 hours) at 12/10/2022 1021 Last data filed at 12/10/2022 0356 Gross per 24 hour  Intake 1265.57 ml  Output --  Net 1265.57 ml   Filed Weights   12/08/22 1952 12/09/22 1617  Weight: 63.5 kg 58.4 kg    General appearance: Awake alert.  In no distress Resp: Clear to auscultation bilaterally.  Normal effort Cardio: S1-S2 is normal regular.  No S3-S4.  No rubs murmurs or bruit  GI: Abdomen is soft.  Nontender nondistended.  Bowel sounds are present normal.  No masses organomegaly Extremities: No edema.  Full range of motion of lower extremities. Neurologic: Alert and oriented x3.  No focal neurological deficits.    Lab Results:  Data Reviewed: I have personally reviewed following labs and reports of the imaging studies  CBC: Recent Labs  Lab  12/08/22 2109 12/09/22 0552 12/10/22 0747  WBC 20.8* 25.0* 13.9*  NEUTROABS 18.5* 23.9*  --   HGB 12.5 14.4 12.8  HCT 37.7 44.3 39.5  MCV 92.0 95.1 92.9  PLT 280 298 276    Basic Metabolic Panel: Recent Labs  Lab 12/08/22 2109 12/09/22 0552 12/10/22 0747  NA 136 136 136  K 3.9 4.7 3.8  CL 103 107 108  CO2 20* 13* 18*  GLUCOSE 302* 333* 168*  BUN 14 16 19   CREATININE 0.71 0.87 0.74  CALCIUM 9.2 9.1 9.1  MG  --  1.8  --     GFR: Estimated Creatinine Clearance: 82.7 mL/min (by C-G formula based on SCr of 0.74 mg/dL).  Liver Function Tests: Recent Labs  Lab 12/09/22 0552  AST 26  ALT 14  ALKPHOS 107  BILITOT 1.2*  PROT 7.8  ALBUMIN 4.7    HbA1C: Recent Labs    12/09/22 0552  HGBA1C 7.4*    CBG: Recent Labs  Lab 12/09/22 2010 12/09/22 2210 12/10/22 0014 12/10/22 0410 12/10/22 0722  GLUCAP 191* 151* 115* 86 154*      Radiology Studies: No results found.     LOS: 1 day   Dolph Tavano [employer] on [email or web address]  12/10/2022, 10:21 AM

## 2022-12-11 ENCOUNTER — Telehealth (HOSPITAL_COMMUNITY): Payer: Self-pay | Admitting: Pharmacy Technician

## 2022-12-11 ENCOUNTER — Other Ambulatory Visit (HOSPITAL_COMMUNITY): Payer: Self-pay

## 2022-12-11 DIAGNOSIS — R112 Nausea with vomiting, unspecified: Secondary | ICD-10-CM | POA: Diagnosis not present

## 2022-12-11 LAB — BASIC METABOLIC PANEL
Anion gap: 5 (ref 5–15)
BUN: 14 mg/dL (ref 6–20)
CO2: 20 mmol/L — ABNORMAL LOW (ref 22–32)
Calcium: 8.2 mg/dL — ABNORMAL LOW (ref 8.9–10.3)
Chloride: 112 mmol/L — ABNORMAL HIGH (ref 98–111)
Creatinine, Ser: 0.49 mg/dL (ref 0.44–1.00)
GFR, Estimated: >60 mL/min (ref >60)
Glucose, Bld: 121 mg/dL — ABNORMAL HIGH (ref 70–99)
Potassium: 3.5 mmol/L (ref 3.5–5.1)
Sodium: 137 mmol/L (ref 135–145)

## 2022-12-11 LAB — URINALYSIS, ROUTINE W REFLEX MICROSCOPIC
Bilirubin Urine: NEGATIVE
Glucose, UA: 50 mg/dL — AB
Ketones, ur: 20 mg/dL — AB
Nitrite: NEGATIVE
Protein, ur: 30 mg/dL — AB
Specific Gravity, Urine: 1.021 (ref 1.005–1.030)
pH: 6 (ref 5.0–8.0)

## 2022-12-11 LAB — CBC
HCT: 36.8 % (ref 36.0–46.0)
Hemoglobin: 11.8 g/dL — ABNORMAL LOW (ref 12.0–15.0)
MCH: 30.1 pg (ref 26.0–34.0)
MCHC: 32.1 g/dL (ref 30.0–36.0)
MCV: 93.9 fL (ref 80.0–100.0)
Platelets: 255 10*3/uL (ref 150–400)
RBC: 3.92 MIL/uL (ref 3.87–5.11)
RDW: 13.3 % (ref 11.5–15.5)
WBC: 8.1 10*3/uL (ref 4.0–10.5)
nRBC: 0 % (ref 0.0–0.2)

## 2022-12-11 LAB — GLUCOSE, CAPILLARY
Glucose-Capillary: 164 mg/dL — ABNORMAL HIGH (ref 70–99)
Glucose-Capillary: 100 mg/dL — ABNORMAL HIGH (ref 70–99)

## 2022-12-11 MED ORDER — PANTOPRAZOLE SODIUM 40 MG PO TBEC: 40.0000 mg | DELAYED_RELEASE_TABLET | Freq: Every day | ORAL | Status: DC

## 2022-12-11 MED ORDER — PANTOPRAZOLE SODIUM 40 MG PO TBEC: 40.0000 mg | DELAYED_RELEASE_TABLET | Freq: Every day | ORAL | 0 refills | Status: DC

## 2022-12-11 MED ORDER — CEFPODOXIME PROXETIL 200 MG PO TABS: 200.0000 mg | ORAL_TABLET | Freq: Two times a day (BID) | ORAL | 0 refills | Status: DC

## 2022-12-11 MED ORDER — CEPHALEXIN 500 MG PO CAPS: 500.0000 mg | ORAL_CAPSULE | Freq: Three times a day (TID) | ORAL | Status: DC

## 2022-12-11 MED ORDER — METOCLOPRAMIDE HCL 10 MG PO TABS: 10.0000 mg | ORAL_TABLET | Freq: Three times a day (TID) | ORAL | 0 refills | Status: DC

## 2022-12-11 MED ORDER — POTASSIUM CHLORIDE CRYS ER 20 MEQ PO TBCR
40.0000 meq | EXTENDED_RELEASE_TABLET | Freq: Once | ORAL | Status: AC
  Administered 2022-12-11: 40 meq via ORAL
  Filled 2022-12-11: qty 2

## 2022-12-11 NOTE — Telephone Encounter (Signed)
Pharmacy Patient Advocate Encounter  Received notification from [facility] that Prior Authorization for Cefpodoxime Proxetil 200MG  tablet has been APPROVED from 12/11/2022 to 12/10/2023. Ran test claim, Copay is $4.00. This test claim was processed through [facility]- copay amounts may vary at other pharmacies due to pharmacy/plan contracts, or as the patient moves through the different stages of their insurance plan.   PA #/Case ID/Reference #: [id number] Key: [id number]

## 2022-12-11 NOTE — Discharge Summary (Signed)
[facility]  Physician Discharge Summary   [name] MRN: [id number] DOB/AGE: [birth year] 32 y.o.  Admit date: 12/08/2022 Discharge date: 12/11/2022    PCP: Elizabeth Palau, FNP  DISCHARGE DIAGNOSES:  Acute gastroenteritis versus gastroparesis Diabetes mellitus type 1   RECOMMENDATIONS FOR OUTPATIENT FOLLOW UP: Patient told to follow-up with her outpatient providers    Home Health: None Equipment/Devices: None  CODE STATUS: Full code  DISCHARGE CONDITION: fair  Diet recommendation: Modified carbohydrate  INITIAL HISTORY: 32 y.o. female with medical history significant for type 1 diabetes with insulin pump, GERD, gastroparesis, hypertension being admitted to the hospital with abdominal pain and intractable nausea and vomiting along with diarrhea.  Initially thought to be gastroparesis.  She likely had gastroenteritis to begin with.    HOSPITAL COURSE:   Nausea vomiting and diarrhea, likely acute gastroenteritis/possible diabetic gastroparesis Patient presented with nausea vomiting and diarrhea.  Likely had gastroenteritis which may have exacerbated her gastroparesis.  Was given IV Reglan.  Given symptomatic treatment primarily.  Given IV fluids.  Improvement noted.  Diet was advanced.  She will be discharged on oral metoclopramide.  She may discuss further management with her primary care provider.     Diabetes mellitus type 1, mild DKA Initial blood work did suggest DKA.  She has insulin pump.  She was hydrated.  Insulin pump was resumed.  Subsequent blood work shows resolution of her DKA.  Labs are stable this morning.  She can go home with her usual insulin pump regimen.     Leukocytosis Possibly reactive.  Initial UA did not show any infection.  However see below for repeat UA results.    Urinary tract infection This morning patient mentioned cloudy urine and dysuria.  Initial UA did not suggest infection.  UA was repeated and showed  moderate leukocytes with 21-50 WBC.  Due to her symptoms she was started on antibiotics.  She mentioned that Keflex usually does not help her.  So she was discharged on cefpodoxime.  Culture data is pending.   Essential hypertension  Patient is stable.  Okay for discharge home today.  PERTINENT LABS:  The results of significant diagnostics from this hospitalization (including imaging, microbiology, ancillary and laboratory) are listed below for reference.    Labs:   Basic Metabolic Panel: Recent Labs  Lab 12/08/22 2109 12/09/22 0552 12/10/22 0747 12/11/22 0601  NA 136 136 136 137  K 3.9 4.7 3.8 3.5  CL 103 107 108 112*  CO2 20* 13* 18* 20*  GLUCOSE 302* 333* 168* 121*  BUN 14 16 19 14   CREATININE 0.71 0.87 0.74 0.49  CALCIUM 9.2 9.1 9.1 8.2*  MG  --  1.8  --   --    Liver Function Tests: Recent Labs  Lab 12/09/22 0552  AST 26  ALT 14  ALKPHOS 107  BILITOT 1.2*  PROT 7.8  ALBUMIN 4.7   CBC: Recent Labs  Lab 12/08/22 2109 12/09/22 0552 12/10/22 0747 12/11/22 0601  WBC 20.8* 25.0* 13.9* 8.1  NEUTROABS 18.5* 23.9*  --   --   HGB 12.5 14.4 12.8 11.8*  HCT 37.7 44.3 39.5 36.8  MCV 92.0 95.1 92.9 93.9  PLT 280 298 276 255    CBG: Recent Labs  Lab 12/10/22 1125 12/10/22 1652 12/10/22 2043 12/11/22 0246 12/11/22 0747  GLUCAP 176* 183* 126* 164* 100*     IMAGING STUDIES No results found.  DISCHARGE EXAMINATION: Vitals:   12/10/22 0409 12/10/22 1411 12/10/22 2041 12/11/22 0440  BP: [name])  101/59 110/80 101/62 (!) 104/59  Pulse: 100 95 94 95  Resp: 16 18 16 16   Temp: 98.8 F (37.1 C) 98.7 F (37.1 C) 98.8 F (37.1 C) 98.2 F (36.8 C)  TempSrc: Oral Oral Oral Oral  SpO2: 99% 100% 100% 99%  Weight:      Height:       General appearance: Awake alert.  In no distress Resp: Clear to auscultation bilaterally.  Normal effort Cardio: S1-S2 is normal regular.  No S3-S4.  No rubs murmurs or bruit GI: Abdomen is soft.  Nontender nondistended.  Bowel  sounds are present normal.  No masses organomegaly Extremities: No edema.  Full range of motion of lower extremities. Neurologic: Alert and oriented x3.  No focal neurological deficits.    DISPOSITION: Home  Discharge Instructions     Call MD for:  difficulty breathing, headache or visual disturbances   Complete by: As directed    Call MD for:  extreme fatigue   Complete by: As directed    Call MD for:  persistant dizziness or light-headedness   Complete by: As directed    Call MD for:  persistant nausea and vomiting   Complete by: As directed    Call MD for:  severe uncontrolled pain   Complete by: As directed    Call MD for:  temperature >100.4   Complete by: As directed    Diet Carb Modified   Complete by: As directed    Discharge instructions   Complete by: As directed    Please be sure to follow-up with your primary care provider within 1 week.  Seek attention if your urinary symptoms do not improve with the antibiotics prescribed.  You were cared for by a hospitalist during your hospital stay. If you have any questions about your discharge medications or the care you received while you were in the hospital after you are discharged, you can call the unit and asked to speak with the hospitalist on call if the hospitalist that took care of you is not available. Once you are discharged, your primary care physician will handle any further medical issues. Please note that NO REFILLS for any discharge medications will be authorized once you are discharged, as it is imperative that you return to your primary care physician (or establish a relationship with a primary care physician if you do not have one) for your aftercare needs so that they can reassess your need for medications and monitor your lab values. If you do not have a primary care physician, you can call [phone] for a physician referral.   Increase activity slowly   Complete by: As directed          Allergies as of  12/11/2022       Reactions   Peanut-containing Drug Products Swelling, Other (See Comments)   Reaction:  Swelling of mouth and lips    Food Swelling, Other (See Comments)   Pt is allergic to strawberries.   Reaction:  Swelling of mouth and lips    Ultram [tramadol] Itching        Medication List     STOP taking these medications    loperamide 2 MG capsule Commonly known as: IMODIUM       TAKE these medications    albuterol 108 (90 Base) MCG/ACT inhaler Commonly known as: VENTOLIN HFA Inhale 1-2 puffs into the lungs every 6 (six) hours as needed for wheezing or shortness of breath.   blood glucose meter kit and  supplies Dispense based on patient and insurance preference. Use up to four times daily as directed. (FOR ICD-10 E10.9, E11.9).   cefpodoxime 200 MG tablet Commonly known as: VANTIN Take 1 tablet (200 mg total) by mouth 2 (two) times daily for 5 days.   insulin aspart 100 UNIT/ML FlexPen Commonly known as: NOVOLOG Inject 4 Units into the skin 3 (three) times daily with meals.   Insulin Pen Needle 31G X 5 MM Misc 30 Units by Does not apply route at bedtime.   lisinopril 2.5 MG tablet Commonly known as: ZESTRIL Take 2.5 mg by mouth daily.   metoCLOPramide 10 MG tablet Commonly known as: REGLAN Take 1 tablet (10 mg total) by mouth 3 (three) times daily with meals.   pantoprazole 40 MG tablet Commonly known as: PROTONIX Take 1 tablet (40 mg total) by mouth daily for 14 days.   promethazine 25 MG tablet Commonly known as: PHENERGAN Take 25 mg by mouth every 6 (six) hours as needed for nausea or vomiting.          Follow-up Information     Elizabeth Palau, FNP. Schedule an appointment as soon as possible for a visit in 1 week(s).   Specialty: Nurse Practitioner Why: post hospitalization follow up Contact information: [address] [place] [address] [phone]         Willis Modena, MD Follow up.   Specialty:  Gastroenterology Contact information: [address] [place] [address] [phone]                 TOTAL DISCHARGE TIME: 35 minutes  Osa Campoli Rito Ehrlich  [facility] Pager on [email or web address]  12/12/2022, 10:40 AM

## 2022-12-12 LAB — URINE CULTURE: Culture: <10000 — AB

## 2022-12-15 ENCOUNTER — Encounter (HOSPITAL_COMMUNITY): Payer: Self-pay | Admitting: Emergency Medicine

## 2022-12-15 ENCOUNTER — Other Ambulatory Visit: Payer: Self-pay

## 2022-12-15 ENCOUNTER — Emergency Department (HOSPITAL_COMMUNITY): Payer: Medicaid Other

## 2022-12-15 ENCOUNTER — Inpatient Hospital Stay (HOSPITAL_COMMUNITY)
Admission: EM | Admit: 2022-12-15 | Discharge: 2022-12-17 | DRG: 074 | Disposition: A | Discharge status: Home / Self Care | Payer: Medicaid Other | Attending: Internal Medicine | Admitting: Internal Medicine

## 2022-12-15 DIAGNOSIS — R112 Nausea with vomiting, unspecified: Principal | ICD-10-CM | POA: Diagnosis present

## 2022-12-15 DIAGNOSIS — I10 Essential (primary) hypertension: Secondary | ICD-10-CM | POA: Diagnosis present

## 2022-12-15 DIAGNOSIS — Z91010 Allergy to peanuts: Secondary | ICD-10-CM

## 2022-12-15 DIAGNOSIS — Z794 Long term (current) use of insulin: Secondary | ICD-10-CM

## 2022-12-15 DIAGNOSIS — Z8249 Family history of ischemic heart disease and other diseases of the circulatory system: Secondary | ICD-10-CM

## 2022-12-15 DIAGNOSIS — Z888 Allergy status to other drugs, medicaments and biological substances status: Secondary | ICD-10-CM

## 2022-12-15 DIAGNOSIS — K3184 Gastroparesis: Secondary | ICD-10-CM | POA: Diagnosis present

## 2022-12-15 DIAGNOSIS — K2100 Gastro-esophageal reflux disease with esophagitis, without bleeding: Secondary | ICD-10-CM | POA: Diagnosis present

## 2022-12-15 DIAGNOSIS — Z9049 Acquired absence of other specified parts of digestive tract: Secondary | ICD-10-CM

## 2022-12-15 DIAGNOSIS — K209 Esophagitis, unspecified without bleeding: Secondary | ICD-10-CM

## 2022-12-15 DIAGNOSIS — Z79899 Other long term (current) drug therapy: Secondary | ICD-10-CM

## 2022-12-15 DIAGNOSIS — Z833 Family history of diabetes mellitus: Secondary | ICD-10-CM

## 2022-12-15 DIAGNOSIS — Z83438 Family history of other disorder of lipoprotein metabolism and other lipidemia: Secondary | ICD-10-CM

## 2022-12-15 DIAGNOSIS — Z9641 Presence of insulin pump (external) (internal): Secondary | ICD-10-CM | POA: Diagnosis present

## 2022-12-15 DIAGNOSIS — E1043 Type 1 diabetes mellitus with diabetic autonomic (poly)neuropathy: Principal | ICD-10-CM | POA: Diagnosis present

## 2022-12-15 LAB — GLUCOSE, CAPILLARY
Glucose-Capillary: 403 mg/dL — ABNORMAL HIGH (ref 70–99)
Glucose-Capillary: 265 mg/dL — ABNORMAL HIGH (ref 70–99)

## 2022-12-15 LAB — URINALYSIS, ROUTINE W REFLEX MICROSCOPIC
Bacteria, UA: NONE SEEN
Bilirubin Urine: NEGATIVE
Glucose, UA: >=500 mg/dL — AB
Hgb urine dipstick: NEGATIVE
Ketones, ur: 80 mg/dL — AB
Leukocytes,Ua: NEGATIVE
Nitrite: NEGATIVE
Protein, ur: NEGATIVE mg/dL
Specific Gravity, Urine: 1.021 (ref 1.005–1.030)
pH: 6 (ref 5.0–8.0)

## 2022-12-15 LAB — COMPREHENSIVE METABOLIC PANEL
ALT: 20 U/L (ref 0–44)
AST: 28 U/L (ref 15–41)
Albumin: 5 g/dL (ref 3.5–5.0)
Alkaline Phosphatase: 94 U/L (ref 38–126)
Anion gap: 13 (ref 5–15)
BUN: 17 mg/dL (ref 6–20)
CO2: 23 mmol/L (ref 22–32)
Calcium: 10.1 mg/dL (ref 8.9–10.3)
Chloride: 102 mmol/L (ref 98–111)
Creatinine, Ser: 0.75 mg/dL (ref 0.44–1.00)
GFR, Estimated: >60 mL/min (ref >60)
Glucose, Bld: 140 mg/dL — ABNORMAL HIGH (ref 70–99)
Potassium: 3.4 mmol/L — ABNORMAL LOW (ref 3.5–5.1)
Sodium: 138 mmol/L (ref 135–145)
Total Bilirubin: 0.9 mg/dL (ref <1.2)
Total Protein: 9 g/dL — ABNORMAL HIGH (ref 6.5–8.1)

## 2022-12-15 LAB — CBC WITH DIFFERENTIAL/PLATELET
Abs Immature Granulocytes: 0.03 10*3/uL (ref 0.00–0.07)
Basophils Absolute: 0 10*3/uL (ref 0.0–0.1)
Basophils Relative: 0 %
Eosinophils Absolute: 0 10*3/uL (ref 0.0–0.5)
Eosinophils Relative: 0 %
HCT: 32.7 % — ABNORMAL LOW (ref 36.0–46.0)
Hemoglobin: 11.2 g/dL — ABNORMAL LOW (ref 12.0–15.0)
Immature Granulocytes: 0 %
Lymphocytes Relative: 8 %
Lymphs Abs: 0.9 10*3/uL (ref 0.7–4.0)
MCH: 31.1 pg (ref 26.0–34.0)
MCHC: 34.3 g/dL (ref 30.0–36.0)
MCV: 90.8 fL (ref 80.0–100.0)
Monocytes Absolute: 0.3 10*3/uL (ref 0.1–1.0)
Monocytes Relative: 3 %
Neutro Abs: 9.3 10*3/uL — ABNORMAL HIGH (ref 1.7–7.7)
Neutrophils Relative %: 89 %
Platelets: 257 10*3/uL (ref 150–400)
RBC: 3.6 MIL/uL — ABNORMAL LOW (ref 3.87–5.11)
RDW: 13.2 % (ref 11.5–15.5)
WBC: 10.5 10*3/uL (ref 4.0–10.5)
nRBC: 0 % (ref 0.0–0.2)

## 2022-12-15 LAB — CREATININE, SERUM
Creatinine, Ser: 0.82 mg/dL (ref 0.44–1.00)
GFR, Estimated: >60 mL/min (ref >60)

## 2022-12-15 LAB — CBC
HCT: 39 % (ref 36.0–46.0)
Hemoglobin: 12.9 g/dL (ref 12.0–15.0)
MCH: 30.7 pg (ref 26.0–34.0)
MCHC: 33.1 g/dL (ref 30.0–36.0)
MCV: 92.9 fL (ref 80.0–100.0)
Platelets: 313 10*3/uL (ref 150–400)
RBC: 4.2 MIL/uL (ref 3.87–5.11)
RDW: 13.2 % (ref 11.5–15.5)
WBC: 18.2 10*3/uL — ABNORMAL HIGH (ref 4.0–10.5)
nRBC: 0 % (ref 0.0–0.2)

## 2022-12-15 LAB — HIV ANTIBODY (ROUTINE TESTING W REFLEX): HIV Screen 4th Generation wRfx: NONREACTIVE

## 2022-12-15 LAB — CBG MONITORING, ED: Glucose-Capillary: 137 mg/dL — ABNORMAL HIGH (ref 70–99)

## 2022-12-15 LAB — LIPASE, BLOOD: Lipase: 23 U/L (ref 11–51)

## 2022-12-15 LAB — HCG, SERUM, QUALITATIVE: Preg, Serum: NEGATIVE

## 2022-12-15 MED ORDER — IOHEXOL 300 MG/ML  SOLN
100.0000 mL | Freq: Once | INTRAMUSCULAR | Status: AC | PRN
  Administered 2022-12-15: 100 mL via INTRAVENOUS

## 2022-12-15 MED ORDER — INSULIN ASPART 100 UNIT/ML IJ SOLN
7.0000 [IU] | Freq: Once | INTRAMUSCULAR | Status: AC
  Administered 2022-12-15: 7 [IU] via SUBCUTANEOUS

## 2022-12-15 MED ORDER — SODIUM CHLORIDE 0.9 % IV BOLUS
1000.0000 mL | Freq: Once | INTRAVENOUS | Status: AC
  Administered 2022-12-15: 1000 mL via INTRAVENOUS

## 2022-12-15 MED ORDER — ALBUTEROL SULFATE (2.5 MG/3ML) 0.083% IN NEBU: 2.5000 mg | INHALATION_SOLUTION | RESPIRATORY_TRACT | Status: DC | PRN

## 2022-12-15 MED ORDER — ACETAMINOPHEN 650 MG RE SUPP: 650.0000 mg | Freq: Four times a day (QID) | RECTAL | Status: DC | PRN

## 2022-12-15 MED ORDER — ENOXAPARIN SODIUM 40 MG/0.4ML IJ SOSY
40.0000 mg | PREFILLED_SYRINGE | Freq: Every day | INTRAMUSCULAR | Status: DC
  Administered 2022-12-15 – 2022-12-17 (×3): 40 mg via SUBCUTANEOUS
  Filled 2022-12-15 (×3): qty 0.4

## 2022-12-15 MED ORDER — TRAZODONE HCL 50 MG PO TABS: 25.0000 mg | ORAL_TABLET | Freq: Every evening | ORAL | Status: DC | PRN

## 2022-12-15 MED ORDER — HYDROMORPHONE HCL 1 MG/ML IJ SOLN
1.0000 mg | INTRAMUSCULAR | Status: DC | PRN
  Administered 2022-12-15 – 2022-12-17 (×10): 1 mg via INTRAVENOUS
  Filled 2022-12-15 (×11): qty 1

## 2022-12-15 MED ORDER — HYDRALAZINE HCL 20 MG/ML IJ SOLN: 5.0000 mg | Freq: Four times a day (QID) | INTRAMUSCULAR | Status: DC | PRN

## 2022-12-15 MED ORDER — INSULIN GLARGINE-YFGN 100 UNIT/ML ~~LOC~~ SOLN
10.0000 [IU] | Freq: Every day | SUBCUTANEOUS | Status: DC
  Administered 2022-12-15 – 2022-12-17 (×3): 10 [IU] via SUBCUTANEOUS
  Filled 2022-12-15 (×3): qty 0.1

## 2022-12-15 MED ORDER — SODIUM CHLORIDE 0.9 % IV SOLN
12.5000 mg | Freq: Four times a day (QID) | INTRAVENOUS | Status: DC | PRN
  Administered 2022-12-15 – 2022-12-17 (×5): 12.5 mg via INTRAVENOUS
  Filled 2022-12-15 (×3): qty 12.5
  Filled 2022-12-15: qty 0.5
  Filled 2022-12-15: qty 12.5

## 2022-12-15 MED ORDER — METOCLOPRAMIDE HCL 5 MG/ML IJ SOLN
10.0000 mg | Freq: Four times a day (QID) | INTRAMUSCULAR | Status: DC | PRN
  Administered 2022-12-15 (×3): 10 mg via INTRAVENOUS
  Filled 2022-12-15 (×3): qty 2

## 2022-12-15 MED ORDER — HYDROMORPHONE HCL 1 MG/ML IJ SOLN
1.0000 mg | Freq: Once | INTRAMUSCULAR | Status: AC
Start: 2022-12-15 — End: 2022-12-15
  Administered 2022-12-15: 1 mg via INTRAVENOUS
  Filled 2022-12-15: qty 1

## 2022-12-15 MED ORDER — PANTOPRAZOLE SODIUM 40 MG IV SOLR
40.0000 mg | Freq: Once | INTRAVENOUS | Status: AC
  Administered 2022-12-15: 40 mg via INTRAVENOUS
  Filled 2022-12-15: qty 10

## 2022-12-15 MED ORDER — HYDROMORPHONE HCL 1 MG/ML IJ SOLN
0.5000 mg | Freq: Once | INTRAMUSCULAR | Status: AC
  Administered 2022-12-15: 0.5 mg via INTRAVENOUS
  Filled 2022-12-15: qty 0.5

## 2022-12-15 MED ORDER — HYDROMORPHONE HCL 1 MG/ML IJ SOLN
0.5000 mg | Freq: Once | INTRAMUSCULAR | Status: AC
  Administered 2022-12-15: 0.5 mg via INTRAVENOUS
  Filled 2022-12-15: qty 1

## 2022-12-15 MED ORDER — DROPERIDOL 2.5 MG/ML IJ SOLN
2.5000 mg | Freq: Once | INTRAMUSCULAR | Status: AC
  Administered 2022-12-15: 2.5 mg via INTRAVENOUS
  Filled 2022-12-15: qty 2

## 2022-12-15 MED ORDER — DIAZEPAM 5 MG/ML IJ SOLN
5.0000 mg | Freq: Once | INTRAMUSCULAR | Status: AC
  Administered 2022-12-15: 5 mg via INTRAVENOUS
  Filled 2022-12-15: qty 2

## 2022-12-15 MED ORDER — INSULIN ASPART 100 UNIT/ML IJ SOLN
0.0000 [IU] | INTRAMUSCULAR | Status: DC
  Administered 2022-12-15: 5 [IU] via SUBCUTANEOUS
  Administered 2022-12-16: 9 [IU] via SUBCUTANEOUS
  Administered 2022-12-16: 1 [IU] via SUBCUTANEOUS
  Administered 2022-12-16: 7 [IU] via SUBCUTANEOUS
  Administered 2022-12-16 – 2022-12-17 (×2): 2 [IU] via SUBCUTANEOUS
  Administered 2022-12-17: 5 [IU] via SUBCUTANEOUS

## 2022-12-15 MED ORDER — KETOROLAC TROMETHAMINE 15 MG/ML IJ SOLN
15.0000 mg | Freq: Once | INTRAMUSCULAR | Status: AC
  Administered 2022-12-15: 15 mg via INTRAVENOUS
  Filled 2022-12-15: qty 1

## 2022-12-15 MED ORDER — ACETAMINOPHEN 325 MG PO TABS: 650.0000 mg | ORAL_TABLET | Freq: Four times a day (QID) | ORAL | Status: DC | PRN

## 2022-12-15 MED ORDER — INSULIN GLARGINE-YFGN 100 UNIT/ML ~~LOC~~ SOLN
10.0000 [IU] | Freq: Every day | SUBCUTANEOUS | Status: DC
  Filled 2022-12-15: qty 0.1

## 2022-12-15 MED ORDER — INSULIN ASPART 100 UNIT/ML IJ SOLN
0.0000 [IU] | INTRAMUSCULAR | Status: DC
  Filled 2022-12-15: qty 0.15

## 2022-12-15 MED ORDER — PROMETHAZINE HCL 25 MG/ML IJ SOLN
12.5000 mg | Freq: Four times a day (QID) | INTRAMUSCULAR | Status: DC | PRN
  Administered 2022-12-15: 12.5 mg via INTRAMUSCULAR
  Filled 2022-12-15: qty 1

## 2022-12-15 MED ORDER — SODIUM CHLORIDE 0.9 % IV SOLN: INTRAVENOUS | Status: AC

## 2022-12-15 MED ORDER — POTASSIUM CHLORIDE 10 MEQ/100ML IV SOLN
10.0000 meq | Freq: Once | INTRAVENOUS | Status: AC
  Administered 2022-12-15: 10 meq via INTRAVENOUS
  Filled 2022-12-15: qty 100

## 2022-12-15 MED ORDER — PANTOPRAZOLE SODIUM 40 MG IV SOLR
40.0000 mg | INTRAVENOUS | Status: DC
  Administered 2022-12-16: 40 mg via INTRAVENOUS
  Filled 2022-12-15: qty 10

## 2022-12-15 NOTE — Inpatient Diabetes Management (Addendum)
Inpatient Diabetes Program Recommendations  AACE/ADA: New Consensus Statement on Inpatient Glycemic Control (2015)  Target Ranges:  Prepandial:   less than 140 mg/dL      Peak postprandial:   less than 180 mg/dL (1-2 hours)      Critically ill patients:  140 - 180 mg/dL   Lab Results  Component Value Date   GLUCAP 137 (H) 12/15/2022   HGBA1C 7.4 (H) 12/09/2022    Review of Glycemic Control  Latest Reference Range & Units 12/15/22 05:42  Glucose-Capillary 70 - 99 mg/dL 098 (H)   Diabetes history: DM1 Outpatient Diabetes medications: Tandum T-slim Insulin pump with Dexcom CGM Insulin pump settings: Basal 0.95 I:C - 9 ISF 40 Current orders for Inpatient glycemic control:  Novolog moderate q 4 hours  Inpatient Diabetes Program Recommendations:    Note patient has insulin pump- Recommend continuing insulin pump with q 4 hour monitoring - Please order insulin pump orders and d/c Novolog correction q 4 hours.   Addendum:  1191- Notified by MD that patient states she stopped insulin pump in the ED b/c it ran out of battery.  Up to see patient at bedside and CGM is reading >400.  RN checked blood sugar and it was 403 mg/dL.  Explained to patient that she cannot remove pump without basal/bolus insulin order.   Recommend Novolog 7 units x1 and Semglee 10 units daily (first dose now). Recommend Novolog correction sensitive (0-9 units) q 4 hours as well.  Communicated with MD and RN regarding my recommendations.    Thanks,  Beryl Meager, RN, BC-ADM Inpatient Diabetes Coordinator Pager [phone]  (8a-5p)

## 2022-12-15 NOTE — ED Notes (Signed)
ED TO INPATIENT HANDOFF REPORT  ED Nurse Name and Phone #: Giara Mcgaughey N  S Name/Age/Gender [name] 32 y.o. female Room/Bed: WA12/WA12  Code Status   Code Status: Full Code  Home/SNF/Other Home Patient oriented to: self, place, time, and situation Is this baseline? Yes   Triage Complete: Triage complete  Chief Complaint Intractable vomiting with nausea [R11.2]  Triage Note Patient reports nausea and vomiting starting tonight. Hx of gastroparesis. Recently admitted for same and discharged on 11/22. Type 1 diabetic but reports cbg has been normal.    Allergies Allergies  Allergen Reactions   Peanut-Containing Drug Products Swelling and Other (See Comments)    Reaction:  Swelling of mouth and lips    Food Swelling and Other (See Comments)    Pt is allergic to strawberries.   Reaction:  Swelling of mouth and lips    Ultram [Tramadol] Itching    Level of Care/Admitting Diagnosis ED Disposition     ED Disposition  Admit   Condition  --   Comment  Hospital [facility] [[id number]]  Level of Care: Med-Surg [16]  May place patient in observation at [facility] or [facility] if equivalent level of care is available:: Yes  Covid Evaluation: Asymptomatic - no recent exposure (last 10 days) testing not required  Diagnosis: Intractable vomiting with nausea [1610960]  Admitting Physician: Maryln Gottron [[id number]]  Attending Physician: Kirby Crigler, MIR M [[id number]]          B Medical/Surgery History Past Medical History:  Diagnosis Date   Anxiety    Arthritis    "hands, feet, knees" (12/18/2016)   Asthma    Diabetic gastroparesis (HCC)    Per gastric emptying study 07/09/16 which showed significant delayed gastric emptying.   Gallstones    Gastroparesis    GERD (gastroesophageal reflux disease)    Heart murmur    Hepatic steatosis 11/26/2014   and hepatomegaly   Hypertension    hx (12/18/2016)   Intractable cyclical vomiting  syndrome    /notes 12/18/2016   Liver mass 11/26/2014   Pancreatitis, acute 11/26/2014   Pneumonia    "as a teen X 1" (12/18/2016)   Type I diabetes mellitus (HCC) 2007   IDDM.  poorly controlled, multiple admits with DKA   Past Surgical History:  Procedure Laterality Date   CHOLECYSTECTOMY N/A 02/11/2015   Procedure: LAPAROSCOPIC CHOLECYSTECTOMY WITH INTRAOPERATIVE CHOLANGIOGRAM;  Surgeon: Gaynelle Adu, MD;  Location: [facility] ORS;  Service: General;  Laterality: N/A;   ESOPHAGOGASTRODUODENOSCOPY (EGD) WITH PROPOFOL Left 09/20/2014   Procedure: ESOPHAGOGASTRODUODENOSCOPY (EGD) WITH PROPOFOL;  Surgeon: Willis Modena, MD;  Location: [facility] ENDOSCOPY;  Service: Endoscopy;  Laterality: Left;   ESOPHAGOGASTRODUODENOSCOPY (EGD) WITH PROPOFOL N/A 03/31/2019   Procedure: ESOPHAGOGASTRODUODENOSCOPY (EGD) WITH PROPOFOL;  Surgeon: Bernette Redbird, MD;  Location: [facility] ENDOSCOPY;  Service: Endoscopy;  Laterality: N/A;   WISDOM TOOTH EXTRACTION       A IV Location/Drains/Wounds Patient Lines/Drains/Airways Status     Active Line/Drains/Airways     Name Placement date Placement time Site Days   Peripheral IV 12/15/22 20 G 1.25" Anterior;Left Forearm 12/15/22  0650  Forearm  less than 1            Intake/Output Last 24 hours No intake or output data in the 24 hours ending 12/15/22 1417  Labs/Imaging Results for orders placed or performed during the hospital encounter of 12/15/22 (from the past 48 hour(s))  CBG monitoring, ED     Status: Abnormal   Collection Time:  12/15/22  5:42 AM  Result Value Ref Range   Glucose-Capillary 137 (H) 70 - 99 mg/dL    Comment: Glucose reference range applies only to samples taken after fasting for at least 8 hours.  Comprehensive metabolic panel     Status: Abnormal   Collection Time: 12/15/22  6:30 AM  Result Value Ref Range   Sodium 138 135 - 145 mmol/L   Potassium 3.4 (L) 3.5 - 5.1 mmol/L   Chloride 102 98 - 111 mmol/L   CO2 23 22 - 32 mmol/L   Glucose, Bld 140  (H) 70 - 99 mg/dL    Comment: Glucose reference range applies only to samples taken after fasting for at least 8 hours.   Braun 17 6 - 20 mg/dL   Creatinine, Ser 9.60 0.44 - 1.00 mg/dL   Calcium 45.4 8.9 - 09.8 mg/dL   Total Protein 9.0 (H) 6.5 - 8.1 g/dL   Albumin 5.0 3.5 - 5.0 g/dL   AST 28 15 - 41 U/L   ALT 20 0 - 44 U/L   Alkaline Phosphatase 94 38 - 126 U/L   Total Bilirubin 0.9 <1.2 mg/dL   GFR, Estimated >11 >91 mL/min    Comment: (NOTE) Calculated using the CKD-EPI Creatinine Equation (2021)    Anion gap 13 5 - 15    Comment: Performed at [facility], [address], [place], [place] [address]  Lipase, blood     Status: None   Collection Time: 12/15/22  6:30 AM  Result Value Ref Range   Lipase 23 11 - 51 U/L    Comment: Performed at [facility], [address], [place], [place] [address]  hCG, serum, qualitative     Status: None   Collection Time: 12/15/22  6:30 AM  Result Value Ref Range   Preg, Serum NEGATIVE NEGATIVE    Comment:        THE SENSITIVITY OF THIS METHODOLOGY IS >10 mIU/mL. Performed at [facility], [address] [place], [place] [address]   CBC with Differential/Platelet     Status: Abnormal   Collection Time: 12/15/22  9:00 AM  Result Value Ref Range   WBC 10.5 4.0 - 10.5 K/uL   RBC 3.60 (L) 3.87 - 5.11 MIL/uL   Hemoglobin 11.2 (L) 12.0 - 15.0 g/dL   HCT 84.6 (L) 96.2 - 95.2 %   MCV 90.8 80.0 - 100.0 fL   MCH 31.1 26.0 - 34.0 pg   MCHC 34.3 30.0 - 36.0 g/dL   RDW 84.1 32.4 - 40.1 %   Platelets 257 150 - 400 K/uL   nRBC 0.0 0.0 - 0.2 %   Neutrophils Relative % 89 %   Neutro Abs 9.3 (H) 1.7 - 7.7 K/uL   Lymphocytes Relative 8 %   Lymphs Abs 0.9 0.7 - 4.0 K/uL   Monocytes Relative 3 %   Monocytes Absolute 0.3 0.1 - 1.0 K/uL   Eosinophils Relative 0 %   Eosinophils Absolute 0.0 0.0 - 0.5 K/uL   Basophils Relative 0 %   Basophils Absolute 0.0 0.0 - 0.1 K/uL   Immature Granulocytes 0 %    Abs Immature Granulocytes 0.03 0.00 - 0.07 K/uL    Comment: Performed at [facility], [address], [place], [place] [address]   CT ABDOMEN PELVIS W CONTRAST  Result Date: 12/15/2022 CLINICAL DATA:  History of gastroparesis with abdominal pain, nausea and vomiting EXAM: CT ABDOMEN AND PELVIS WITH CONTRAST TECHNIQUE:  Multidetector CT imaging of the abdomen and pelvis was performed using the standard protocol following bolus administration of intravenous contrast. RADIATION DOSE REDUCTION: This exam was performed according to the departmental dose-optimization program which includes automated exposure control, adjustment of the mA and/or kV according to patient size and/or use of iterative reconstruction technique. CONTRAST:  OMNIPAQUE IOHEXOL 300 MG/ML  SOLN COMPARISON:  CT abdomen and pelvis dated 02/18/2019 FINDINGS: Decreased sensitivity and specificity for detailed findings due to motion artifact. Lower chest: No focal consolidation or pulmonary nodule in the lung bases. No pleural effusion or pneumothorax demonstrated. Partially imaged heart size is normal. Hepatobiliary: No focal hepatic lesions. No intra or extrahepatic biliary ductal dilation. Cholecystectomy. Pancreas: Atrophic pancreas. Spleen: Normal in size without focal abnormality. Adrenals/Urinary Tract: No adrenal nodules. Right renal cortical scarring. No suspicious renal mass, calculi or hydronephrosis. No focal bladder wall thickening. Stomach/Bowel: Circumferential mural thickening of the distal esophagus. Normal appearance of the stomach. No evidence of bowel wall thickening, distention, or inflammatory changes. Normal appendix. Vascular/Lymphatic: No significant vascular findings are present. No enlarged abdominal or pelvic lymph nodes. Reproductive: No adnexal masses. Rounded enhancing mass along the posterior uterine body measures 3.3 x 2.7 cm, likely leiomyoma, previously 1.7 x 1.5 cm. Other: No free  fluid, fluid collection, or free air. Musculoskeletal: No acute or abnormal lytic or blastic osseous lesions. IMPRESSION: 1. Circumferential mural thickening of the distal esophagus, which may be seen in the setting of esophagitis. 2. Increased size of rounded enhancing mass along the posterior uterine body measuring 3.3 x 2.7 cm, likely leiomyoma. Electronically Signed   By: Agustin Cree M.D.   On: 12/15/2022 09:02    Pending Labs Unresulted Labs (From admission, onward)     Start     Ordered   12/22/22 0500  Creatinine, serum  (enoxaparin (LOVENOX)    CrCl >/= 30 ml/min)  Weekly,   R     Comments: while on enoxaparin therapy    12/15/22 1337   12/16/22 0500  Basic metabolic panel  Tomorrow morning,   R        12/15/22 1337   12/16/22 0500  CBC  Tomorrow morning,   R        12/15/22 1337   12/15/22 1335  HIV Antibody (routine testing w rflx)  (HIV Antibody (Routine testing w reflex) panel)  Once,   R        12/15/22 1337   12/15/22 1335  CBC  (enoxaparin (LOVENOX)    CrCl >/= 30 ml/min)  Once,   R       Comments: Baseline for enoxaparin therapy IF NOT ALREADY DRAWN.  Notify MD if PLT < 100 K.    12/15/22 1337   12/15/22 1335  Creatinine, serum  (enoxaparin (LOVENOX)    CrCl >/= 30 ml/min)  Once,   R       Comments: Baseline for enoxaparin therapy IF NOT ALREADY DRAWN.    12/15/22 1337   12/15/22 0605  CBC with Differential  (ED Abdominal Pain)  Once,   STAT        12/15/22 0604   12/15/22 0605  Urinalysis, Routine w reflex microscopic -Urine, Clean Catch  (ED Abdominal Pain)  Once,   URGENT       Question:  Specimen Source  Answer:  Urine, Clean Catch   12/15/22 0604            Vitals/Pain Today's Vitals   12/15/22 0922 12/15/22 1056 12/15/22 1145 12/15/22  1330  BP:   (!) 142/94 127/86  Pulse:   (!) 116 (!) 112  Resp:   15 (!) 21  Temp:  98 F (36.7 C)    TempSrc:  Oral    SpO2:   100% 100%  Weight:      Height:      PainSc: 8        Isolation Precautions No active  isolations  Medications Medications  promethazine (PHENERGAN) 12.5 mg in sodium chloride 0.9 % 50 mL IVPB (12.5 mg Intravenous New Bag/Given 12/15/22 0818)  pantoprazole (PROTONIX) injection 40 mg (has no administration in time range)  enoxaparin (LOVENOX) injection 40 mg (has no administration in time range)  acetaminophen (TYLENOL) tablet 650 mg (has no administration in time range)    Or  acetaminophen (TYLENOL) suppository 650 mg (has no administration in time range)  traZODone (DESYREL) tablet 25 mg (has no administration in time range)  albuterol (PROVENTIL) (2.5 MG/3ML) 0.083% nebulizer solution 2.5 mg (has no administration in time range)  metoCLOPramide (REGLAN) injection 10 mg (has no administration in time range)  HYDROmorphone (DILAUDID) injection 1 mg (has no administration in time range)  hydrALAZINE (APRESOLINE) injection 5 mg (has no administration in time range)  insulin aspart (novoLOG) injection 0-15 Units (has no administration in time range)  sodium chloride 0.9 % bolus 1,000 mL (1,000 mLs Intravenous Bolus 12/15/22 0707)  HYDROmorphone (DILAUDID) injection 1 mg (1 mg Intravenous Given 12/15/22 0654)  iohexol (OMNIPAQUE) 300 MG/ML solution 100 mL (100 mLs Intravenous Contrast Given 12/15/22 0852)  HYDROmorphone (DILAUDID) injection 0.5 mg (0.5 mg Intravenous Given 12/15/22 0817)  pantoprazole (PROTONIX) injection 40 mg (40 mg Intravenous Given 12/15/22 0916)  droperidol (INAPSINE) 2.5 MG/ML injection 2.5 mg (2.5 mg Intravenous Given 12/15/22 1058)  diazepam (VALIUM) injection 5 mg (5 mg Intravenous Given 12/15/22 1058)  potassium chloride 10 mEq in 100 mL IVPB (10 mEq Intravenous New Bag/Given 12/15/22 1059)  ketorolac (TORADOL) 15 MG/ML injection 15 mg (15 mg Intravenous Given 12/15/22 1325)    Mobility walks     Focused Assessments Gastro- intractable vomiting and esophagitis    R Recommendations: See Admitting Provider Note  Report given to:    Additional Notes: Aox4, 20 left forearm saline locked, ambulatory, continent

## 2022-12-15 NOTE — H&P (Signed)
History and Physical  Chetana Scopel OZH:[id number] DOB: [birth year] DOA: 12/15/2022  PCP: Elizabeth Palau, FNP   Chief Complaint: Abdominal pain, vomiting  HPI: [name] is a 32 y.o. female with medical history significant for insulin-dependent type 1 diabetes, gastroparesis, intractable cyclical vomiting syndrome who was recently just discharged from this facility with the same on 11/22 and now returns with recurrent abdominal pain, intractable vomiting likely due to exacerbation of gastroparesis.  Patient states that she is having her typical symptoms with vomiting, which started early this morning.  When she threw up, she threw up everything she has eaten essentially since she left the hospital.  Denies any fevers or chills, no chest pain, no diarrhea, no hematemesis or melena.  Workup in the emergency department is relatively benign.  Patient states that she took off her insulin pump.  She wants to know if she can have some IV Phenergan and IV morphine right now.  Review of Systems: Please see HPI for pertinent positives and negatives. A complete 10 system review of systems are otherwise negative.  Past Medical History:  Diagnosis Date   Anxiety    Arthritis    "hands, feet, knees" (12/18/2016)   Asthma    Diabetic gastroparesis (HCC)    Per gastric emptying study 07/09/16 which showed significant delayed gastric emptying.   Gallstones    Gastroparesis    GERD (gastroesophageal reflux disease)    Heart murmur    Hepatic steatosis 11/26/2014   and hepatomegaly   Hypertension    hx (12/18/2016)   Intractable cyclical vomiting syndrome    /notes 12/18/2016   Liver mass 11/26/2014   Pancreatitis, acute 11/26/2014   Pneumonia    "as a teen X 1" (12/18/2016)   Type I diabetes mellitus (HCC) 2007   IDDM.  poorly controlled, multiple admits with DKA   Past Surgical History:  Procedure Laterality Date   CHOLECYSTECTOMY N/A 02/11/2015   Procedure: LAPAROSCOPIC  CHOLECYSTECTOMY WITH INTRAOPERATIVE CHOLANGIOGRAM;  Surgeon: Gaynelle Adu, MD;  Location: [facility];  Service: General;  Laterality: N/A;   ESOPHAGOGASTRODUODENOSCOPY (EGD) WITH PROPOFOL Left 09/20/2014   Procedure: ESOPHAGOGASTRODUODENOSCOPY (EGD) WITH PROPOFOL;  Surgeon: Willis Modena, MD;  Location: [facility] ENDOSCOPY;  Service: Endoscopy;  Laterality: Left;   ESOPHAGOGASTRODUODENOSCOPY (EGD) WITH PROPOFOL N/A 03/31/2019   Procedure: ESOPHAGOGASTRODUODENOSCOPY (EGD) WITH PROPOFOL;  Surgeon: Bernette Redbird, MD;  Location: [facility] ENDOSCOPY;  Service: Endoscopy;  Laterality: N/A;   WISDOM TOOTH EXTRACTION      Social History:  reports that she has never smoked. She has never used smokeless tobacco. She reports that she does not currently use drugs after having used the following drugs: Marijuana. She reports that she does not drink alcohol.   Allergies  Allergen Reactions   Peanut-Containing Drug Products Swelling and Other (See Comments)    Reaction:  Swelling of mouth and lips    Food Swelling and Other (See Comments)    Pt is allergic to strawberries.   Reaction:  Swelling of mouth and lips    Ultram [Tramadol] Itching    Family History  Problem Relation Age of Onset   Heart disease Maternal Grandmother    Heart disease Maternal Grandfather    Diabetes Mother    Hyperlipidemia Mother    Hypertension Father    Heart disease Father    Hypertension Paternal Grandmother    Cancer Paternal Grandfather      Prior to Admission medications   Medication Sig Start Date End Date Taking? Authorizing Provider  albuterol (PROVENTIL HFA;VENTOLIN  HFA) 108 (90 Base) MCG/ACT inhaler Inhale 1-2 puffs into the lungs every 6 (six) hours as needed for wheezing or shortness of breath.   Yes [provider]  cefpodoxime (VANTIN) 200 MG tablet Take 1 tablet (200 mg total) by mouth 2 (two) times daily for 5 days. 12/11/22 12/16/22 Yes Osvaldo Shipper, MD  insulin aspart (NOVOLOG) 100 UNIT/ML FlexPen Inject 4  Units into the skin 3 (three) times daily with meals. 04/17/19  Yes Hall, Carole N, DO  lisinopril (ZESTRIL) 2.5 MG tablet Take 2.5 mg by mouth daily.   Yes [provider]  metoCLOPramide (REGLAN) 10 MG tablet Take 1 tablet (10 mg total) by mouth 3 (three) times daily with meals. 12/11/22 01/10/23 Yes Osvaldo Shipper, MD  pantoprazole (PROTONIX) 40 MG tablet Take 1 tablet (40 mg total) by mouth daily for 14 days. 12/12/22 12/26/22 Yes Osvaldo Shipper, MD  promethazine (PHENERGAN) 25 MG tablet Take 25 mg by mouth every 6 (six) hours as needed for nausea or vomiting.   Yes [provider]  blood glucose meter kit and supplies Dispense based on patient and insurance preference. Use up to four times daily as directed. (FOR ICD-10 E10.9, E11.9). 12/07/17   Rodolph Bong, MD  Insulin Pen Needle 31G X 5 MM MISC 30 Units by Does not apply route at bedtime. 12/07/17   Rodolph Bong, MD    Physical Exam: BP (!) 142/94   Pulse (!) 116   Temp 98 F (36.7 C) (Oral)   Resp 15   Ht 5\' 3"  (1.6 m)   Wt 58.4 kg   SpO2 100%   BMI 22.81 kg/m   General:  Alert, oriented, generally uncomfortable, writhing in the bed, in no acute distress  Eyes: EOMI, clear conjuctivae, white sclerea Cardiovascular: RRR, no murmurs or rubs, no peripheral edema  Respiratory: clear to auscultation bilaterally, no wheezes, no crackles  Abdomen: soft, voluntary guarding, nondistended, normal bowel tones heard  Skin: dry, no rashes  Musculoskeletal: no joint effusions, normal range of motion  Psychiatric: appropriate affect, normal speech  Neurologic: extraocular muscles intact, clear speech, moving all extremities with intact sensorium         Labs on Admission:  Basic Metabolic Panel: Recent Labs  Lab 12/08/22 2109 12/09/22 0552 12/10/22 0747 12/11/22 0601 12/15/22 0630  NA 136 136 136 137 138  K 3.9 4.7 3.8 3.5 3.4*  CL 103 107 108 112* 102  CO2 20* 13* 18* 20* 23  GLUCOSE 302* 333*  168* 121* 140*  BUN 14 16 19 14 17   CREATININE 0.71 0.87 0.74 0.49 0.75  CALCIUM 9.2 9.1 9.1 8.2* 10.1  MG  --  1.8  --   --   --    Liver Function Tests: Recent Labs  Lab 12/09/22 0552 12/15/22 0630  AST 26 28  ALT 14 20  ALKPHOS 107 94  BILITOT 1.2* 0.9  PROT 7.8 9.0*  ALBUMIN 4.7 5.0   Recent Labs  Lab 12/15/22 0630  LIPASE 23   No results for input(s): "AMMONIA" in the last 168 hours. CBC: Recent Labs  Lab 12/08/22 2109 12/09/22 0552 12/10/22 0747 12/11/22 0601 12/15/22 0900  WBC 20.8* 25.0* 13.9* 8.1 10.5  NEUTROABS 18.5* 23.9*  --   --  9.3*  HGB 12.5 14.4 12.8 11.8* 11.2*  HCT 37.7 44.3 39.5 36.8 32.7*  MCV 92.0 95.1 92.9 93.9 90.8  PLT 280 298 276 255 257   Cardiac Enzymes: No results for input(s): "CKTOTAL", "CKMB", "  CKMBINDEX", "TROPONINI" in the last 168 hours.  BNP (last 3 results) No results for input(s): "BNP" in the last 8760 hours.  ProBNP (last 3 results) No results for input(s): "PROBNP" in the last 8760 hours.  CBG: Recent Labs  Lab 12/10/22 1652 12/10/22 2043 12/11/22 0246 12/11/22 0747 12/15/22 0542  GLUCAP 183* 126* 164* 100* 137*    Radiological Exams on Admission: CT ABDOMEN PELVIS W CONTRAST  Result Date: 12/15/2022 CLINICAL DATA:  History of gastroparesis with abdominal pain, nausea and vomiting EXAM: CT ABDOMEN AND PELVIS WITH CONTRAST TECHNIQUE: Multidetector CT imaging of the abdomen and pelvis was performed using the standard protocol following bolus administration of intravenous contrast. RADIATION DOSE REDUCTION: This exam was performed according to the departmental dose-optimization program which includes automated exposure control, adjustment of the mA and/or kV according to patient size and/or use of iterative reconstruction technique. CONTRAST:  OMNIPAQUE IOHEXOL 300 MG/ML  SOLN COMPARISON:  CT abdomen and pelvis dated 02/18/2019 FINDINGS: Decreased sensitivity and specificity for detailed findings due to motion  artifact. Lower chest: No focal consolidation or pulmonary nodule in the lung bases. No pleural effusion or pneumothorax demonstrated. Partially imaged heart size is normal. Hepatobiliary: No focal hepatic lesions. No intra or extrahepatic biliary ductal dilation. Cholecystectomy. Pancreas: Atrophic pancreas. Spleen: Normal in size without focal abnormality. Adrenals/Urinary Tract: No adrenal nodules. Right renal cortical scarring. No suspicious renal mass, calculi or hydronephrosis. No focal bladder wall thickening. Stomach/Bowel: Circumferential mural thickening of the distal esophagus. Normal appearance of the stomach. No evidence of bowel wall thickening, distention, or inflammatory changes. Normal appendix. Vascular/Lymphatic: No significant vascular findings are present. No enlarged abdominal or pelvic lymph nodes. Reproductive: No adnexal masses. Rounded enhancing mass along the posterior uterine body measures 3.3 x 2.7 cm, likely leiomyoma, previously 1.7 x 1.5 cm. Other: No free fluid, fluid collection, or free air. Musculoskeletal: No acute or abnormal lytic or blastic osseous lesions. IMPRESSION: 1. Circumferential mural thickening of the distal esophagus, which may be seen in the setting of esophagitis. 2. Increased size of rounded enhancing mass along the posterior uterine body measuring 3.3 x 2.7 cm, likely leiomyoma. Electronically Signed   By: Agustin Cree M.D.   On: 12/15/2022 09:02    Assessment/Plan Athena Lancellotti is a 32 y.o. female with medical history significant for insulin-dependent type 1 diabetes, gastroparesis, intractable cyclical vomiting syndrome who was recently just discharged from this facility with the same on 11/22 and now returns with recurrent abdominal pain, intractable vomiting likely due to exacerbation of gastroparesis.   Gastroparesis-likely flare with intractable vomiting, diffuse abdominal pain.  No concerning symptoms, CT scan as above without any acutely concerning  findings. -Observation admission -Keep n.p.o. -Pain and nausea medication as needed  Esophagitis-likely due to recent severe vomiting, she does take oral proton pump inhibitor at home -IV Protonix  Type 1 diabetes-currently blood sugars are well-controlled, last hemoglobin A1c this month 7.4 -Carb controlled diet when eating -Monitor every 4 hour insulin, with sliding scale  Hypertension-hold lisinopril while actively vomiting, IV hydralazine as needed in the meantime  DVT prophylaxis: Lovenox     Code Status: Full Code  Consults called: None  Admission status: Observation  Time spent: 49 minutes  Liana Camerer Sharlette Dense MD [facility] Pager [phone]  If 7PM-7AM, please contact night-coverage [email or web address] Password [facility]  12/15/2022, 1:38 PM

## 2022-12-15 NOTE — Plan of Care (Signed)
  Problem: Fluid Volume: Goal: Ability to maintain a balanced intake and output will improve Outcome: Progressing   Problem: Coping: Goal: Ability to adjust to condition or change in health will improve Outcome: Progressing   Problem: Metabolic: Goal: Ability to maintain appropriate glucose levels will improve Outcome: Progressing   Problem: Skin Integrity: Goal: Risk for impaired skin integrity will decrease Outcome: Progressing   Problem: Education: Goal: Knowledge of General Education information will improve Description: Including pain rating scale, medication(s)/side effects and non-pharmacologic comfort measures Outcome: Progressing   Problem: Pain Management: Goal: General experience of comfort will improve Outcome: Progressing   Problem: Elimination: Goal: Will not experience complications related to bowel motility Outcome: Progressing Goal: Will not experience complications related to urinary retention Outcome: Progressing

## 2022-12-15 NOTE — ED Provider Triage Note (Addendum)
Emergency Medicine Provider Triage Evaluation Note  [name] , a 32 y.o. female  was evaluated in triage.  Pt complains of abdominal pain and emesis.  Started all of a sudden at 4 AM this morning.  Abdominal pain is all over.  Denies diarrhea.  Denies fever.  Recent admission for abdominal pain.  Has history of gastroparesis and type 1 diabetes.  Denies recent alcohol use or marijuana use.  Review of Systems  Positive: See above Negative: See above  Physical Exam  Pulse (!) 113   Resp (!) 24   Ht 5\' 3"  (1.6 m)   Wt 58.4 kg   SpO2 100%   BMI 22.81 kg/m  Gen:   Awake, no distress   Resp:  Normal effort  MSK:   Moves extremities without difficulty  Other:    Medical Decision Making  Medically screening exam initiated at 6:02 AM.  Appropriate orders placed.  [name] was informed that the remainder of the evaluation will be completed by another provider, this initial triage assessment does not replace that evaluation, and the importance of remaining in the ED until their evaluation is complete.  Work up started       Gareth Eagle, [place] 12/15/22 [phone]

## 2022-12-15 NOTE — ED Notes (Signed)
Pt in extreme pain, unable to stop moving. Could not complete EKG

## 2022-12-15 NOTE — ED Triage Notes (Addendum)
Patient reports nausea and vomiting starting tonight. Hx of gastroparesis. Recently admitted for same and discharged on 11/22. Type 1 diabetic but reports cbg has been normal.

## 2022-12-15 NOTE — ED Notes (Signed)
Unable to obtain temperature or blood pressure on patient at this time. Patient is unable to sit still due to vomiting.

## 2022-12-15 NOTE — ED Provider Notes (Signed)
[name] Provider Note   CSN: [id number] Arrival date & time: 12/15/22  0533     History  Chief Complaint  Patient presents with   Emesis    [name] is a 32 y.o. female.  Patient with history of T1DM, Gastroparesis, GERD, hepatic steatosis, hypertension, intractable vomiting syndrome, pancreatitis presents today with complaints of abdominal pain, nausea, and vomiting.  States that same began this morning and has been persistent since then.  She was just recently admitted for similar symptoms.  States this feels the same.  She does states specifically that the only thing that helps her symptoms are Dilaudid and Phenergan.  She is requesting both of these by name. She denies diarrhea, fevers, or chills. Denies alcohol or marijuana use.   The history is provided by the patient. No language interpreter was used.  Emesis Associated symptoms: abdominal pain        Home Medications Prior to Admission medications   Medication Sig Start Date End Date Taking? Authorizing Provider  albuterol (PROVENTIL HFA;VENTOLIN HFA) 108 (90 Base) MCG/ACT inhaler Inhale 1-2 puffs into the lungs every 6 (six) hours as needed for wheezing or shortness of breath.    [provider]  blood glucose meter kit and supplies Dispense based on patient and insurance preference. Use up to four times daily as directed. (FOR ICD-10 E10.9, E11.9). 12/07/17   Rodolph Bong, MD  cefpodoxime (VANTIN) 200 MG tablet Take 1 tablet (200 mg total) by mouth 2 (two) times daily for 5 days. 12/11/22 12/16/22  Osvaldo Shipper, MD  insulin aspart (NOVOLOG) 100 UNIT/ML FlexPen Inject 4 Units into the skin 3 (three) times daily with meals. 04/17/19   Darlin Drop, DO  Insulin Pen Needle 31G X 5 MM MISC 30 Units by Does not apply route at bedtime. 12/07/17   Rodolph Bong, MD  lisinopril (ZESTRIL) 2.5 MG tablet Take 2.5 mg by mouth daily.    [provider]  metoCLOPramide (REGLAN) 10 MG tablet Take 1 tablet (10 mg total) by mouth 3 (three) times daily with meals. 12/11/22 01/10/23  Osvaldo Shipper, MD  pantoprazole (PROTONIX) 40 MG tablet Take 1 tablet (40 mg total) by mouth daily for 14 days. 12/12/22 12/26/22  Osvaldo Shipper, MD  promethazine (PHENERGAN) 25 MG tablet Take 25 mg by mouth every 6 (six) hours as needed for nausea or vomiting.    [provider]      Allergies    Peanut-containing drug products, Food, and Ultram [tramadol]    Review of Systems   Review of Systems  Gastrointestinal:  Positive for abdominal pain, nausea and vomiting.  All other systems reviewed and are negative.   Physical Exam Updated Vital Signs Pulse (!) 116   Resp (!) 25   Ht 5\' 3"  (1.6 m)   Wt 58.4 kg   SpO2 100%   BMI 22.81 kg/m  Physical Exam Vitals and nursing note reviewed.  Constitutional:      General: She is not in acute distress.    Appearance: Normal appearance. She is normal weight. She is not ill-appearing, toxic-appearing or diaphoretic.  HENT:     Head: Normocephalic and atraumatic.  Cardiovascular:     Rate and Rhythm: Normal rate.  Pulmonary:     Effort: Pulmonary effort is normal. No respiratory distress.  Abdominal:     General: Abdomen is flat.     Palpations: Abdomen is soft.     Comments: Generalized  abdominal tenderness to palpation with no rebound or guarding  Musculoskeletal:        General: Normal range of motion.     Cervical back: Normal range of motion.  Skin:    General: Skin is warm and dry.  Neurological:     General: No focal deficit present.     Mental Status: She is alert.  Psychiatric:        Mood and Affect: Mood normal.        Behavior: Behavior normal.     ED Results / Procedures / Treatments   Labs (all labs ordered are listed, but only abnormal results are displayed) Labs Reviewed  COMPREHENSIVE METABOLIC PANEL - Abnormal; Notable for the following components:       Result Value   Potassium 3.4 (*)    Glucose, Bld 140 (*)    Total Protein 9.0 (*)    All other components within normal limits  CBC WITH DIFFERENTIAL/PLATELET - Abnormal; Notable for the following components:   RBC 3.60 (*)    Hemoglobin 11.2 (*)    HCT 32.7 (*)    Neutro Abs 9.3 (*)    All other components within normal limits  CBG MONITORING, ED - Abnormal; Notable for the following components:   Glucose-Capillary 137 (*)    All other components within normal limits  LIPASE, BLOOD  HCG, SERUM, QUALITATIVE  CBC WITH DIFFERENTIAL/PLATELET  URINALYSIS, ROUTINE W REFLEX MICROSCOPIC    EKG EKG Interpretation Date/Time:  Tuesday December 15 2022 07:08:14 EST Ventricular Rate:  102 PR Interval:  155 QRS Duration:  92 QT Interval:  344 QTC Calculation: 449 R Axis:   77  Text Interpretation: Sinus tachycardia Confirmed by Alvester Chou [phone]) on 12/15/2022 7:36:45 AM  Radiology CT ABDOMEN PELVIS W CONTRAST  Result Date: 12/15/2022 CLINICAL DATA:  History of gastroparesis with abdominal pain, nausea and vomiting EXAM: CT ABDOMEN AND PELVIS WITH CONTRAST TECHNIQUE: Multidetector CT imaging of the abdomen and pelvis was performed using the standard protocol following bolus administration of intravenous contrast. RADIATION DOSE REDUCTION: This exam was performed according to the departmental dose-optimization program which includes automated exposure control, adjustment of the mA and/or kV according to patient size and/or use of iterative reconstruction technique. CONTRAST:  OMNIPAQUE IOHEXOL 300 MG/ML  SOLN COMPARISON:  CT abdomen and pelvis dated 02/18/2019 FINDINGS: Decreased sensitivity and specificity for detailed findings due to motion artifact. Lower chest: No focal consolidation or pulmonary nodule in the lung bases. No pleural effusion or pneumothorax demonstrated. Partially imaged heart size is normal. Hepatobiliary: No focal hepatic lesions. No intra or extrahepatic  biliary ductal dilation. Cholecystectomy. Pancreas: Atrophic pancreas. Spleen: Normal in size without focal abnormality. Adrenals/Urinary Tract: No adrenal nodules. Right renal cortical scarring. No suspicious renal mass, calculi or hydronephrosis. No focal bladder wall thickening. Stomach/Bowel: Circumferential mural thickening of the distal esophagus. Normal appearance of the stomach. No evidence of bowel wall thickening, distention, or inflammatory changes. Normal appendix. Vascular/Lymphatic: No significant vascular findings are present. No enlarged abdominal or pelvic lymph nodes. Reproductive: No adnexal masses. Rounded enhancing mass along the posterior uterine body measures 3.3 x 2.7 cm, likely leiomyoma, previously 1.7 x 1.5 cm. Other: No free fluid, fluid collection, or free air. Musculoskeletal: No acute or abnormal lytic or blastic osseous lesions. IMPRESSION: 1. Circumferential mural thickening of the distal esophagus, which may be seen in the setting of esophagitis. 2. Increased size of rounded enhancing mass along the posterior uterine body measuring 3.3 x 2.7 cm, likely  leiomyoma. Electronically Signed   By: Agustin Cree M.D.   On: 12/15/2022 09:02    Procedures Procedures    Medications Ordered in ED Medications  promethazine (PHENERGAN) 12.5 mg in sodium chloride 0.9 % 50 mL IVPB (12.5 mg Intravenous New Bag/Given 12/15/22 0818)  ketorolac (TORADOL) 15 MG/ML injection 15 mg (has no administration in time range)  sodium chloride 0.9 % bolus 1,000 mL (1,000 mLs Intravenous Bolus 12/15/22 0707)  HYDROmorphone (DILAUDID) injection 1 mg (1 mg Intravenous Given 12/15/22 0654)  iohexol (OMNIPAQUE) 300 MG/ML solution 100 mL (100 mLs Intravenous Contrast Given 12/15/22 0852)  HYDROmorphone (DILAUDID) injection 0.5 mg (0.5 mg Intravenous Given 12/15/22 0817)  pantoprazole (PROTONIX) injection 40 mg (40 mg Intravenous Given 12/15/22 0916)  droperidol (INAPSINE) 2.5 MG/ML injection 2.5 mg (2.5 mg  Intravenous Given 12/15/22 1058)  diazepam (VALIUM) injection 5 mg (5 mg Intravenous Given 12/15/22 1058)  potassium chloride 10 mEq in 100 mL IVPB (10 mEq Intravenous New Bag/Given 12/15/22 1059)    ED Course/ Medical Decision Making/ A&P                                 Medical Decision Making Risk Prescription drug management. Decision regarding hospitalization.   This patient is a 32 y.o. female who presents to the ED for concern of abdominal pain, nausea, vomiting, this involves an extensive number of treatment options, and is a complaint that carries with it a high risk of complications and morbidity. The emergent differential diagnosis prior to evaluation includes, but is not limited to, AAA, gastroenteritis, appendicitis, Bowel obstruction, Bowel perforation. Gastroparesis, DKA, Hernia, Inflammatory bowel disease, mesenteric ischemia, pancreatitis, peritonitis SBP, volvulus.   This is not an exhaustive differential.   Past Medical History / Co-morbidities / Social History:  has a past medical history of Anxiety, Arthritis, Asthma, Diabetic gastroparesis (HCC), Gallstones, Gastroparesis, GERD (gastroesophageal reflux disease), Heart murmur, Hepatic steatosis (11/26/2014), Hypertension, Intractable cyclical vomiting syndrome, Liver mass (11/26/2014), Pancreatitis, acute (11/26/2014), Pneumonia, and Type I diabetes mellitus (HCC) (2007).  Additional history: Chart reviewed. Pertinent results include: just recently admitted with concern for DKA as well as intractable nausea and vomiting  Physical Exam: Physical exam performed. The pertinent findings include: vomiting on exam, generalized abdominal TTP  Lab Tests: I ordered, and personally interpreted labs.  The pertinent results include:  hgb 11.2, K 3.4, glucose 140, anion gap WNL   Imaging Studies: I ordered imaging studies including Ct abdomen pelvis. I independently visualized and interpreted imaging which showed   1.  Circumferential mural thickening of the distal esophagus, which may be seen in the setting of esophagitis. 2. Increased size of rounded enhancing mass along the posterior uterine body measuring 3.3 x 2.7 cm, likely leiomyoma.  I agree with the radiologist interpretation.   Cardiac Monitoring:  The patient was maintained on a cardiac monitor.  My attending physician Dr. Renaye Rakers viewed and interpreted the cardiac monitored which showed an underlying rhythm of: sinus tachycardia. I agree with this interpretation.   Medications: I ordered medication including droperidol, valium, dilaudid, phenergan  for pain, nausea, vomiting. Reevaluation of the patient after these medicines showed that the patient improved. I have reviewed the patients home medicines and have made adjustments as needed.  Disposition: After consideration of the diagnostic results and the patients response to treatment, I feel that patient will require admission for intractable nausea and vomiting.  After numerous rounds of antiemetics and pain medication,  patient still states she is nauseous and having pain.  Symptoms likely gastroparesis versus already diagnosed cyclical vomiting syndrome.  She has no signs of DKA at this time.  Discussed with patient is understanding agreement.  Discussed patient with hospitalist Dr. Kirby Crigler who accepts patient for admission.  Findings and plan of care discussed with supervising physician Dr. Renaye Rakers who is in agreement.    Final Clinical Impression(s) / ED Diagnoses Final diagnoses:  Intractable nausea and vomiting  Esophagitis    Rx / DC Orders ED Discharge Orders     None         Vear Clock 12/15/22 1341    Terald Sleeper, MD 12/16/22 [phone]

## 2022-12-16 DIAGNOSIS — Z833 Family history of diabetes mellitus: Secondary | ICD-10-CM | POA: Diagnosis not present

## 2022-12-16 DIAGNOSIS — K3184 Gastroparesis: Secondary | ICD-10-CM | POA: Diagnosis present

## 2022-12-16 DIAGNOSIS — Z79899 Other long term (current) drug therapy: Secondary | ICD-10-CM | POA: Diagnosis not present

## 2022-12-16 DIAGNOSIS — Z8249 Family history of ischemic heart disease and other diseases of the circulatory system: Secondary | ICD-10-CM | POA: Diagnosis not present

## 2022-12-16 DIAGNOSIS — Z888 Allergy status to other drugs, medicaments and biological substances status: Secondary | ICD-10-CM | POA: Diagnosis not present

## 2022-12-16 DIAGNOSIS — I10 Essential (primary) hypertension: Secondary | ICD-10-CM | POA: Diagnosis present

## 2022-12-16 DIAGNOSIS — Z9641 Presence of insulin pump (external) (internal): Secondary | ICD-10-CM | POA: Diagnosis present

## 2022-12-16 DIAGNOSIS — Z83438 Family history of other disorder of lipoprotein metabolism and other lipidemia: Secondary | ICD-10-CM | POA: Diagnosis not present

## 2022-12-16 DIAGNOSIS — R112 Nausea with vomiting, unspecified: Secondary | ICD-10-CM

## 2022-12-16 DIAGNOSIS — Z91010 Allergy to peanuts: Secondary | ICD-10-CM | POA: Diagnosis not present

## 2022-12-16 DIAGNOSIS — E1043 Type 1 diabetes mellitus with diabetic autonomic (poly)neuropathy: Secondary | ICD-10-CM | POA: Diagnosis present

## 2022-12-16 DIAGNOSIS — K2100 Gastro-esophageal reflux disease with esophagitis, without bleeding: Secondary | ICD-10-CM | POA: Diagnosis present

## 2022-12-16 DIAGNOSIS — Z794 Long term (current) use of insulin: Secondary | ICD-10-CM | POA: Diagnosis not present

## 2022-12-16 DIAGNOSIS — Z9049 Acquired absence of other specified parts of digestive tract: Secondary | ICD-10-CM | POA: Diagnosis not present

## 2022-12-16 LAB — GLUCOSE, CAPILLARY
Glucose-Capillary: 114 mg/dL — ABNORMAL HIGH (ref 70–99)
Glucose-Capillary: 141 mg/dL — ABNORMAL HIGH (ref 70–99)
Glucose-Capillary: 161 mg/dL — ABNORMAL HIGH (ref 70–99)
Glucose-Capillary: 301 mg/dL — ABNORMAL HIGH (ref 70–99)
Glucose-Capillary: 362 mg/dL — ABNORMAL HIGH (ref 70–99)
Glucose-Capillary: 92 mg/dL (ref 70–99)

## 2022-12-16 LAB — CBC
HCT: 34.5 % — ABNORMAL LOW (ref 36.0–46.0)
Hemoglobin: 11.4 g/dL — ABNORMAL LOW (ref 12.0–15.0)
MCH: 30.2 pg (ref 26.0–34.0)
MCHC: 33 g/dL (ref 30.0–36.0)
MCV: 91.5 fL (ref 80.0–100.0)
Platelets: 263 10*3/uL (ref 150–400)
RBC: 3.77 MIL/uL — ABNORMAL LOW (ref 3.87–5.11)
RDW: 13.5 % (ref 11.5–15.5)
WBC: 12.4 10*3/uL — ABNORMAL HIGH (ref 4.0–10.5)
nRBC: 0 % (ref 0.0–0.2)

## 2022-12-16 LAB — BASIC METABOLIC PANEL
Anion gap: 9 (ref 5–15)
BUN: 19 mg/dL (ref 6–20)
CO2: 23 mmol/L (ref 22–32)
Calcium: 8.8 mg/dL — ABNORMAL LOW (ref 8.9–10.3)
Chloride: 105 mmol/L (ref 98–111)
Creatinine, Ser: 0.72 mg/dL (ref 0.44–1.00)
GFR, Estimated: >60 mL/min (ref >60)
Glucose, Bld: 90 mg/dL (ref 70–99)
Potassium: 3.1 mmol/L — ABNORMAL LOW (ref 3.5–5.1)
Sodium: 137 mmol/L (ref 135–145)

## 2022-12-16 NOTE — Plan of Care (Signed)
  Problem: Coping: Goal: Ability to adjust to condition or change in health will improve Outcome: Progressing   Problem: Health Behavior/Discharge Planning: Goal: Ability to identify and utilize available resources and services will improve Outcome: Progressing   Problem: Metabolic: Goal: Ability to maintain appropriate glucose levels will improve Outcome: Progressing   Problem: Nutritional: Goal: Maintenance of adequate nutrition will improve Outcome: Progressing   Problem: Tissue Perfusion: Goal: Adequacy of tissue perfusion will improve Outcome: Progressing   Problem: Education: Goal: Knowledge of General Education information will improve Description: Including pain rating scale, medication(s)/side effects and non-pharmacologic comfort measures Outcome: Progressing   Problem: Clinical Measurements: Goal: Ability to maintain clinical measurements within normal limits will improve Outcome: Progressing Goal: Respiratory complications will improve Outcome: Progressing Goal: Cardiovascular complication will be avoided Outcome: Progressing   Problem: Nutrition: Goal: Adequate nutrition will be maintained Outcome: Progressing   Problem: Elimination: Goal: Will not experience complications related to bowel motility Outcome: Progressing

## 2022-12-16 NOTE — Progress Notes (Signed)
Mobility Specialist - Progress Note   12/16/22 1458  Mobility  Activity Ambulated with assistance in hallway  Level of Assistance Independent  Assistive Device None  Distance Ambulated (ft) 500 ft  Range of Motion/Exercises Active  Activity Response Tolerated well  Mobility Referral Yes  $Mobility charge 1 Mobility  Mobility Specialist Start Time (ACUTE ONLY) 1430  Mobility Specialist Stop Time (ACUTE ONLY) 1440  Mobility Specialist Time Calculation (min) (ACUTE ONLY) 10 min   Received in bed and agreed to mobility. No issues throughout session. Returned to bed with all needs met.  Marilynne Halsted Mobility Specialist

## 2022-12-16 NOTE — Progress Notes (Signed)
PROGRESS NOTE    [name]  ACZ:[id number] DOB: [birth year] DOA: 12/15/2022 PCP: Colleen Kotlarz Palau, FNP    Brief Narrative: 32 y.o. female with medical history significant for insulin-dependent type 1 diabetes, gastroparesis, intractable cyclical vomiting syndrome who was recently just discharged from this facility with the same on 11/22 and now returns with recurrent abdominal pain, intractable vomiting likely due to exacerbation of gastroparesis.  Patient states that she is having her typical symptoms with vomiting, which started early this morning.  When she threw up, she threw up everything she has eaten essentially since she left the hospital.  Denies any fevers or chills, no chest pain, no diarrhea, no hematemesis or melena.  Workup in the emergency department is relatively benign.  Patient states that she took off her insulin pump.  She wants to know if she can have some IV Phenergan and IV morphine right now.   Assessment & Plan:   Active Problems:   Intractable vomiting with nausea  Gastroparesis-with intractable nausea and vomiting, diffuse abdominal pain.  No concerning symptoms, CT scan as above without any acutely concerning findings. Advance diet as tolerated.  Phenergan and Zofran as needed.  Continue IV fluids. Patient is still not able to tolerate p.o. intake in spite of Phenergan.   Esophagitis-likely due to recent severe vomiting, she does take oral proton pump inhibitor at home -IV Protonix   Type 1 diabetes-currently blood sugars are well-controlled, last hemoglobin A1c this month 7.4 -Carb controlled diet when eating -Monitor every 4 hour insulin, with sliding scale   Hypertension-hold lisinopril while actively vomiting, IV hydralazine as needed in the meantime   Estimated body mass index is 22.81 kg/m as calculated from the following:   Height as of this encounter: 5\' 3"  (1.6 m).   Weight as of this encounter: 58.4 kg.  DVT prophylaxis: lovenox Code  Status: full Family Communication:none Disposition Plan:  Status is: Inpatient Remains inpatient appropriate because: intractable nausea and vomiting   Consultants:  none  Procedures:none Antimicrobials: none  Subjective: Patient was not able to tolerate her lunch did not eat lunch had a bite of breakfast with Phenergan still nauseous continued on IV fluids  Objective: Vitals:   12/16/22 0527 12/16/22 0942 12/16/22 0943 12/16/22 1210  BP: 122/78 (!) 153/87 132/79 117/79  Pulse: 95 (!) 109 (!) 108 94  Resp: 19 18  17   Temp: 98 F (36.7 C) 98.7 F (37.1 C)  98.6 F (37 C)  TempSrc: Oral Oral  Oral  SpO2: 99% 100%  98%  Weight:      Height:        Intake/Output Summary (Last 24 hours) at 12/16/2022 1455 Last data filed at 12/16/2022 1230 Gross per 24 hour  Intake 330.95 ml  Output --  Net 330.95 ml   Filed Weights   12/15/22 0542  Weight: 58.4 kg    Examination:  General exam: Appears in mild distress due to nausea Respiratory system: Clear to auscultation. Respiratory effort normal. Cardiovascular system: S1 & S2 heard, RRR. No JVD, murmurs, rubs, gallops or clicks. No pedal edema. Gastrointestinal system: Abdomen is nondistended, soft and nontender. No organomegaly or masses felt. Normal bowel sounds heard. Central nervous system: Alert and oriented. No focal neurological deficits. Extremities: No edema   Data Reviewed: I have personally reviewed following labs and imaging studies  CBC: Recent Labs  Lab 12/10/22 0747 12/11/22 0601 12/15/22 0900 12/15/22 1542 12/16/22 0701  WBC 13.9* 8.1 10.5 18.2* 12.4*  NEUTROABS  --   --  9.3*  --   --   HGB 12.8 11.8* 11.2* 12.9 11.4*  HCT 39.5 36.8 32.7* 39.0 34.5*  MCV 92.9 93.9 90.8 92.9 91.5  PLT 276 255 257 313 263   Basic Metabolic Panel: Recent Labs  Lab 12/10/22 0747 12/11/22 0601 12/15/22 0630 12/15/22 1542 12/16/22 0701  NA 136 137 138  --  137  K 3.8 3.5 3.4*  --  3.1*  CL 108 112* 102  --   105  CO2 18* 20* 23  --  23  GLUCOSE 168* 121* 140*  --  90  BUN 19 14 17   --  19  CREATININE 0.74 0.49 0.75 0.82 0.72  CALCIUM 9.1 8.2* 10.1  --  8.8*   GFR: Estimated Creatinine Clearance: 82.7 mL/min (by C-G formula based on SCr of 0.72 mg/dL). Liver Function Tests: Recent Labs  Lab 12/15/22 0630  AST 28  ALT 20  ALKPHOS 94  BILITOT 0.9  PROT 9.0*  ALBUMIN 5.0   Recent Labs  Lab 12/15/22 0630  LIPASE 23   No results for input(s): "AMMONIA" in the last 168 hours. Coagulation Profile: No results for input(s): "INR", "PROTIME" in the last 168 hours. Cardiac Enzymes: No results for input(s): "CKTOTAL", "CKMB", "CKMBINDEX", "TROPONINI" in the last 168 hours. BNP (last 3 results) No results for input(s): "PROBNP" in the last 8760 hours. HbA1C: No results for input(s): "HGBA1C" in the last 72 hours. CBG: Recent Labs  Lab 12/15/22 2001 12/16/22 0002 12/16/22 0403 12/16/22 0733 12/16/22 1211  GLUCAP 265* 114* 141* 92 362*   Lipid Profile: No results for input(s): "CHOL", "HDL", "LDLCALC", "TRIG", "CHOLHDL", "LDLDIRECT" in the last 72 hours. Thyroid Function Tests: No results for input(s): "TSH", "T4TOTAL", "FREET4", "T3FREE", "THYROIDAB" in the last 72 hours. Anemia Panel: No results for input(s): "VITAMINB12", "FOLATE", "FERRITIN", "TIBC", "IRON", "RETICCTPCT" in the last 72 hours. Sepsis Labs: No results for input(s): "PROCALCITON", "LATICACIDVEN" in the last 168 hours.  Recent Results (from the past 240 hour(s))  Urine Culture (for pregnant, neutropenic or urologic patients or patients with an indwelling urinary catheter)     Status: Abnormal   Collection Time: 12/11/22 10:15 AM   Specimen: Urine, Clean Catch  Result Value Ref Range Status   Specimen Description   Final    URINE, CLEAN CATCH Performed at [facility], [address] [place], [place] [address]    Special Requests   Final    NONE Performed at [facility], [address] [place], [place] [address]    Culture (A)  Final    <10,000 COLONIES/mL INSIGNIFICANT GROWTH Performed at [facility], [address] [place], [place] [address]    Report Status 12/12/2022 FINAL  Final         Radiology Studies: CT ABDOMEN PELVIS W CONTRAST  Result Date: 12/15/2022 CLINICAL DATA:  History of gastroparesis with abdominal pain, nausea and vomiting EXAM: CT ABDOMEN AND PELVIS WITH CONTRAST TECHNIQUE: Multidetector CT imaging of the abdomen and pelvis was performed using the standard protocol following bolus administration of intravenous contrast. RADIATION DOSE REDUCTION: This exam was performed according to the departmental dose-optimization program which includes automated exposure control, adjustment of the mA and/or kV according to patient size and/or use of iterative reconstruction technique. CONTRAST:  OMNIPAQUE IOHEXOL 300 MG/ML  SOLN COMPARISON:  CT abdomen and pelvis dated 02/18/2019 FINDINGS: Decreased sensitivity and specificity for detailed findings due to motion artifact. Lower chest: No focal consolidation or pulmonary nodule  in the lung bases. No pleural effusion or pneumothorax demonstrated. Partially imaged heart size is normal. Hepatobiliary: No focal hepatic lesions. No intra or extrahepatic biliary ductal dilation. Cholecystectomy. Pancreas: Atrophic pancreas. Spleen: Normal in size without focal abnormality. Adrenals/Urinary Tract: No adrenal nodules. Right renal cortical scarring. No suspicious renal mass, calculi or hydronephrosis. No focal bladder wall thickening. Stomach/Bowel: Circumferential mural thickening of the distal esophagus. Normal appearance of the stomach. No evidence of bowel wall thickening, distention, or inflammatory changes. Normal appendix. Vascular/Lymphatic: No significant vascular findings are present. No enlarged abdominal or pelvic lymph nodes. Reproductive: No adnexal masses. Rounded enhancing  mass along the posterior uterine body measures 3.3 x 2.7 cm, likely leiomyoma, previously 1.7 x 1.5 cm. Other: No free fluid, fluid collection, or free air. Musculoskeletal: No acute or abnormal lytic or blastic osseous lesions. IMPRESSION: 1. Circumferential mural thickening of the distal esophagus, which may be seen in the setting of esophagitis. 2. Increased size of rounded enhancing mass along the posterior uterine body measuring 3.3 x 2.7 cm, likely leiomyoma. Electronically Signed   By: Agustin Cree M.D.   On: 12/15/2022 09:02        Scheduled Meds:  enoxaparin (LOVENOX) injection  40 mg Subcutaneous Daily   insulin aspart  0-9 Units Subcutaneous Q4H   insulin glargine-yfgn  10 Units Subcutaneous Daily   pantoprazole (PROTONIX) IV  40 mg Intravenous Q24H   Continuous Infusions:  promethazine (PHENERGAN) injection (IM or IVPB) 12.5 mg (12/16/22 1042)     LOS: 0 days    Time spent: 38 min  Alwyn Ren, MD 12/16/2022, 2:55 PM

## 2022-12-16 NOTE — Inpatient Diabetes Management (Signed)
Inpatient Diabetes Program Recommendations  AACE/ADA: New Consensus Statement on Inpatient Glycemic Control   Target Ranges:  Prepandial:   less than 140 mg/dL      Peak postprandial:   less than 180 mg/dL (1-2 hours)      Critically ill patients:  140 - 180 mg/dL    Latest Reference Range & Units 12/16/22 00:02 12/16/22 04:03 12/16/22 07:33  Glucose-Capillary 70 - 99 mg/dL 010 (H) 932 (H) 92    Latest Reference Range & Units 12/09/22 05:52  Hemoglobin A1C 4.8 - 5.6 % 7.4 (H)    Review of Glycemic Control  Diabetes history: DM1 Outpatient Diabetes medications: Tandem T-Slim pump with Dexcom CGM Current orders for Inpatient glycemic control: Semglee 10 units daily, Novolog 0-9 units Q4H  Inpatient Diabetes Program Recommendations:    Insulin: While inpatient, if provider wants patient to resume her insulin pump, please let patient know so she can have someone bring all needed pump supplies to her here at the hospital.   NOTE: Spoke with patient at bedside. Patient reports that she does not have all needed insulin pump supplies here at the hospital. Patient states she has someone who can bring all needed pump supplies if attending provider wants her to resume her insulin pump while inpatient. Patient is using a Dexcom CGM and currently has CGM on left arm. Patient reports that she is fine with using SQ insulin regimen while inpatient. Patient states that she was able to eat some eggs at breakfast and has kept it down. Patient states if she is able to keep food down with advanced diet, she may be able to be discharged today or tomorrow. Discussed that if she receives the Semglee 10 units SQ that she will need to wait about 24 hours from last dose of Semglee before resuming her insulin pump. Patient states she is aware and she notes that if she is discharged tomorrow morning she will decline taking the [facility] tomorrow morning. Patient states she has no questions or concerns at this time related  to DM.  Thanks, Orlando Penner, RN, MSN, CDCES Diabetes Coordinator Inpatient Diabetes Program [phone] (Team Pager from 8am to 5pm)

## 2022-12-16 NOTE — Progress Notes (Signed)
   12/16/22 1459  TOC Brief Assessment  Insurance and Status Reviewed  Patient has primary care physician Yes  Home environment has been reviewed Single family home  Prior level of function: Independent  Prior/Current Home Services No current home services  Social Determinants of Health Reivew SDOH reviewed no interventions necessary  Readmission risk has been reviewed Yes  Transition of care needs no transition of care needs at this time

## 2022-12-17 DIAGNOSIS — R112 Nausea with vomiting, unspecified: Secondary | ICD-10-CM | POA: Diagnosis not present

## 2022-12-17 LAB — GLUCOSE, CAPILLARY
Glucose-Capillary: 127 mg/dL — ABNORMAL HIGH (ref 70–99)
Glucose-Capillary: 195 mg/dL — ABNORMAL HIGH (ref 70–99)
Glucose-Capillary: 291 mg/dL — ABNORMAL HIGH (ref 70–99)

## 2022-12-17 MED ORDER — ACETAMINOPHEN 325 MG PO TABS: 650.0000 mg | ORAL_TABLET | Freq: Four times a day (QID) | ORAL | Status: AC | PRN

## 2022-12-17 MED ORDER — PROMETHAZINE HCL 25 MG PO TABS: 25.0000 mg | ORAL_TABLET | Freq: Four times a day (QID) | ORAL | 0 refills | Status: AC | PRN

## 2022-12-17 MED ORDER — METOCLOPRAMIDE HCL 10 MG PO TABS: 10.0000 mg | ORAL_TABLET | Freq: Three times a day (TID) | ORAL | 0 refills | Status: DC | PRN

## 2022-12-17 NOTE — Discharge Summary (Signed)
Physician Discharge Summary  [name] QMV:[id number] DOB: [birth year] DOA: 12/15/2022  PCP: Austynn Pridmore Palau, FNP  Admit date: 12/15/2022 Discharge date: 12/17/2022  Admitted From: Home Disposition: Home   Recommendations for Outpatient Follow-up:  Follow up with PCP in 1-2 weeks Please obtain BMP/CBC in one week  Home Health: None Equipment/Devices: None   Discharge Condition: Stable CODE STATUS: Full code Diet recommendation: Carb modified small portions  Brief/Interim Summary:32 y.o. female with medical history significant for insulin-dependent type 1 diabetes, gastroparesis, intractable cyclical vomiting syndrome who was recently just discharged from this facility with the same on 11/22 and now returns with recurrent abdominal pain, intractable vomiting likely due to exacerbation of gastroparesis.  Patient states that she is having her typical symptoms with vomiting, which started early this morning.  When she threw up, she threw up everything she has eaten essentially since she left the hospital.  Denies any fevers or chills, no chest pain, no diarrhea, no hematemesis or melena.  Workup in the emergency department is relatively benign.   Discharge Diagnoses:  Active Problems:   Intractable vomiting with nausea  Gastroparesis-patient was admitted with intractable nausea and vomiting, diffuse abdominal pain.  CT abdomen did not reveal any acute findings.  Basically she was admitted to the floor treated symptomatically and supportively given IV fluids Phenergan and Zofran and morphine she improved and she was able to tolerate a diet prior to discharge she ambulated prior to discharge.   Esophagitis-continue PPI   Type 1 diabetes-continue insulin pump.     Hypertension-lisinopril 2.5 mg daily  Estimated body mass index is 22.81 kg/m as calculated from the following:   Height as of this encounter: 5\' 3"  (1.6 m).   Weight as of this encounter: 58.4 kg.  Discharge  Instructions  Discharge Instructions     Diet - low sodium heart healthy   Complete by: As directed    Increase activity slowly   Complete by: As directed       Allergies as of 12/17/2022       Reactions   Peanut-containing Drug Products Swelling, Other (See Comments)   Reaction:  Swelling of mouth and lips    Food Swelling, Other (See Comments)   Pt is allergic to strawberries.   Reaction:  Swelling of mouth and lips    Ultram [tramadol] Itching        Medication List     STOP taking these medications    cefpodoxime 200 MG tablet Commonly known as: VANTIN       TAKE these medications    acetaminophen 325 MG tablet Commonly known as: TYLENOL Take 2 tablets (650 mg total) by mouth every 6 (six) hours as needed for mild pain (pain score 1-3) (or Fever >/= 101).   albuterol 108 (90 Base) MCG/ACT inhaler Commonly known as: VENTOLIN HFA Inhale 1-2 puffs into the lungs every 6 (six) hours as needed for wheezing or shortness of breath.   blood glucose meter kit and supplies Dispense based on patient and insurance preference. Use up to four times daily as directed. (FOR ICD-10 E10.9, E11.9).   insulin aspart 100 UNIT/ML FlexPen Commonly known as: NOVOLOG Inject 4 Units into the skin 3 (three) times daily with meals.   Insulin Pen Needle 31G X 5 MM Misc 30 Units by Does not apply route at bedtime.   lisinopril 2.5 MG tablet Commonly known as: ZESTRIL Take 2.5 mg by mouth daily.   metoCLOPramide 10 MG tablet Commonly known as: REGLAN Take 1  tablet (10 mg total) by mouth every 8 (eight) hours as needed for nausea. What changed:  when to take this reasons to take this   pantoprazole 40 MG tablet Commonly known as: PROTONIX Take 1 tablet (40 mg total) by mouth daily for 14 days.   promethazine 25 MG tablet Commonly known as: PHENERGAN Take 1 tablet (25 mg total) by mouth every 6 (six) hours as needed for nausea or vomiting.        Follow-up Information      Bena Kobel Palau, FNP Follow up.   Specialty: Nurse Practitioner Contact information: [address] [place] [address] [phone]                Allergies  Allergen Reactions   Peanut-Containing Drug Products Swelling and Other (See Comments)    Reaction:  Swelling of mouth and lips    Food Swelling and Other (See Comments)    Pt is allergic to strawberries.   Reaction:  Swelling of mouth and lips    Ultram [Tramadol] Itching    Consultations: None   Procedures/Studies: CT ABDOMEN PELVIS W CONTRAST  Result Date: 12/15/2022 CLINICAL DATA:  History of gastroparesis with abdominal pain, nausea and vomiting EXAM: CT ABDOMEN AND PELVIS WITH CONTRAST TECHNIQUE: Multidetector CT imaging of the abdomen and pelvis was performed using the standard protocol following bolus administration of intravenous contrast. RADIATION DOSE REDUCTION: This exam was performed according to the departmental dose-optimization program which includes automated exposure control, adjustment of the mA and/or kV according to patient size and/or use of iterative reconstruction technique. CONTRAST:  OMNIPAQUE IOHEXOL 300 MG/ML  SOLN COMPARISON:  CT abdomen and pelvis dated 02/18/2019 FINDINGS: Decreased sensitivity and specificity for detailed findings due to motion artifact. Lower chest: No focal consolidation or pulmonary nodule in the lung bases. No pleural effusion or pneumothorax demonstrated. Partially imaged heart size is normal. Hepatobiliary: No focal hepatic lesions. No intra or extrahepatic biliary ductal dilation. Cholecystectomy. Pancreas: Atrophic pancreas. Spleen: Normal in size without focal abnormality. Adrenals/Urinary Tract: No adrenal nodules. Right renal cortical scarring. No suspicious renal mass, calculi or hydronephrosis. No focal bladder wall thickening. Stomach/Bowel: Circumferential mural thickening of the distal esophagus. Normal appearance of the stomach.  No evidence of bowel wall thickening, distention, or inflammatory changes. Normal appendix. Vascular/Lymphatic: No significant vascular findings are present. No enlarged abdominal or pelvic lymph nodes. Reproductive: No adnexal masses. Rounded enhancing mass along the posterior uterine body measures 3.3 x 2.7 cm, likely leiomyoma, previously 1.7 x 1.5 cm. Other: No free fluid, fluid collection, or free air. Musculoskeletal: No acute or abnormal lytic or blastic osseous lesions. IMPRESSION: 1. Circumferential mural thickening of the distal esophagus, which may be seen in the setting of esophagitis. 2. Increased size of rounded enhancing mass along the posterior uterine body measuring 3.3 x 2.7 cm, likely leiomyoma. Electronically Signed   By: Agustin Cree M.D.   On: 12/15/2022 09:02   (Echo, Carotid, EGD, Colonoscopy, ERCP)    Subjective:  She is resting in bed anxious to go home ordered breakfast which she tolerated. Discharge Exam: Vitals:   12/16/22 1925 12/17/22 0525  BP: 125/83 115/73  Pulse: 94 87  Resp: 20 19  Temp: 98.2 F (36.8 C) 98.4 F (36.9 C)  SpO2: 99% 98%   Vitals:   12/16/22 0943 12/16/22 1210 12/16/22 1925 12/17/22 0525  BP: 132/79 117/79 125/83 115/73  Pulse: (!) 108 94 94 87  Resp:  17 20 19   Temp:  98.6 F (37 C) 98.2 F (36.8 C) 98.4 F (36.9 C)  TempSrc:  Oral Oral Oral  SpO2:  98% 99% 98%  Weight:      Height:        General: Pt is alert, awake, not in acute distress Cardiovascular: RRR, S1/S2 +, no rubs, no gallops Respiratory: CTA bilaterally, no wheezing, no rhonchi Abdominal: Soft, NT, ND, bowel sounds + Extremities: no edema, no cyanosis    The results of significant diagnostics from this hospitalization (including imaging, microbiology, ancillary and laboratory) are listed below for reference.     Microbiology: Recent Results (from the past 240 hour(s))  Urine Culture (for pregnant, neutropenic or urologic patients or patients with an indwelling  urinary catheter)     Status: Abnormal   Collection Time: 12/11/22 10:15 AM   Specimen: Urine, Clean Catch  Result Value Ref Range Status   Specimen Description   Final    URINE, CLEAN CATCH Performed at [facility], [address] [place], [place] [address]    Special Requests   Final    NONE Performed at [facility], [address], [place], [place] [address]    Culture (A)  Final    <10,000 COLONIES/mL INSIGNIFICANT GROWTH Performed at [facility], [address], [place], [place] [address]    Report Status 12/12/2022 FINAL  Final     Labs: BNP (last 3 results) No results for input(s): "BNP" in the last 8760 hours. Basic Metabolic Panel: Recent Labs  Lab 12/11/22 0601 12/15/22 0630 12/15/22 1542 12/16/22 0701  NA 137 138  --  137  K 3.5 3.4*  --  3.1*  CL 112* 102  --  105  CO2 20* 23  --  23  GLUCOSE 121* 140*  --  90  BUN 14 17  --  19  CREATININE 0.49 0.75 0.82 0.72  CALCIUM 8.2* 10.1  --  8.8*   Liver Function Tests: Recent Labs  Lab 12/15/22 0630  AST 28  ALT 20  ALKPHOS 94  BILITOT 0.9  PROT 9.0*  ALBUMIN 5.0   Recent Labs  Lab 12/15/22 0630  LIPASE 23   No results for input(s): "AMMONIA" in the last 168 hours. CBC: Recent Labs  Lab 12/11/22 0601 12/15/22 0900 12/15/22 1542 12/16/22 0701  WBC 8.1 10.5 18.2* 12.4*  NEUTROABS  --  9.3*  --   --   HGB 11.8* 11.2* 12.9 11.4*  HCT 36.8 32.7* 39.0 34.5*  MCV 93.9 90.8 92.9 91.5  PLT 255 257 313 263   Cardiac Enzymes: No results for input(s): "CKTOTAL", "CKMB", "CKMBINDEX", "TROPONINI" in the last 168 hours. BNP: Invalid input(s): "POCBNP" CBG: Recent Labs  Lab 12/16/22 1617 12/16/22 2009 12/17/22 0004 12/17/22 0358 12/17/22 0743  GLUCAP 301* 161* 291* 195* 127*   D-Dimer No results for input(s): "DDIMER" in the last 72 hours. Hgb A1c No results for input(s): "HGBA1C" in the last 72 hours. Lipid Profile No results for input(s):  "CHOL", "HDL", "LDLCALC", "TRIG", "CHOLHDL", "LDLDIRECT" in the last 72 hours. Thyroid function studies No results for input(s): "TSH", "T4TOTAL", "T3FREE", "THYROIDAB" in the last 72 hours.  Invalid input(s): "FREET3" Anemia work up No results for input(s): "VITAMINB12", "FOLATE", "FERRITIN", "TIBC", "IRON", "RETICCTPCT" in the last 72 hours. Urinalysis    Component Value Date/Time   COLORURINE COLORLESS (A) 12/15/2022 1434   APPEARANCEUR CLEAR 12/15/2022 1434   LABSPEC 1.021 12/15/2022 1434   PHURINE 6.0 12/15/2022 1434   GLUCOSEU >=  500 (A) 12/15/2022 1434   GLUCOSEU >=1000 11/07/2012 1205   HGBUR NEGATIVE 12/15/2022 1434   BILIRUBINUR NEGATIVE 12/15/2022 1434   KETONESUR 80 (A) 12/15/2022 1434   PROTEINUR NEGATIVE 12/15/2022 1434   UROBILINOGEN 0.2 11/24/2014 1045   NITRITE NEGATIVE 12/15/2022 1434   LEUKOCYTESUR NEGATIVE 12/15/2022 1434   Sepsis Labs Recent Labs  Lab 12/11/22 0601 12/15/22 0900 12/15/22 1542 12/16/22 0701  WBC 8.1 10.5 18.2* 12.4*   Microbiology Recent Results (from the past 240 hour(s))  Urine Culture (for pregnant, neutropenic or urologic patients or patients with an indwelling urinary catheter)     Status: Abnormal   Collection Time: 12/11/22 10:15 AM   Specimen: Urine, Clean Catch  Result Value Ref Range Status   Specimen Description   Final    URINE, CLEAN CATCH Performed at [facility], [address], [place], [place] [address]    Special Requests   Final    NONE Performed at [facility], [address], [place], [place] [address]    Culture (A)  Final    <10,000 COLONIES/mL INSIGNIFICANT GROWTH Performed at [facility], [address], [place], [place] [address]    Report Status 12/12/2022 FINAL  Final     Time coordinating discharge: 37 minutes  SIGNED:  Alwyn Ren, MD  [facility] 12/17/2022, 4:16 PM

## 2022-12-19 ENCOUNTER — Inpatient Hospital Stay (HOSPITAL_COMMUNITY)
Admission: EM | Admit: 2022-12-19 | Discharge: 2022-12-22 | DRG: 074 | Disposition: A | Discharge status: Home / Self Care | Payer: Medicaid Other | Attending: Internal Medicine | Admitting: Internal Medicine

## 2022-12-19 DIAGNOSIS — Z882 Allergy status to sulfonamides status: Secondary | ICD-10-CM

## 2022-12-19 DIAGNOSIS — F1210 Cannabis abuse, uncomplicated: Secondary | ICD-10-CM | POA: Diagnosis present

## 2022-12-19 DIAGNOSIS — M159 Polyosteoarthritis, unspecified: Secondary | ICD-10-CM | POA: Diagnosis present

## 2022-12-19 DIAGNOSIS — R197 Diarrhea, unspecified: Secondary | ICD-10-CM | POA: Diagnosis present

## 2022-12-19 DIAGNOSIS — Z83438 Family history of other disorder of lipoprotein metabolism and other lipidemia: Secondary | ICD-10-CM

## 2022-12-19 DIAGNOSIS — R1110 Vomiting, unspecified: Principal | ICD-10-CM

## 2022-12-19 DIAGNOSIS — Z79899 Other long term (current) drug therapy: Secondary | ICD-10-CM

## 2022-12-19 DIAGNOSIS — Z91010 Allergy to peanuts: Secondary | ICD-10-CM

## 2022-12-19 DIAGNOSIS — E1043 Type 1 diabetes mellitus with diabetic autonomic (poly)neuropathy: Principal | ICD-10-CM | POA: Diagnosis present

## 2022-12-19 DIAGNOSIS — K2100 Gastro-esophageal reflux disease with esophagitis, without bleeding: Secondary | ICD-10-CM | POA: Diagnosis present

## 2022-12-19 DIAGNOSIS — Z794 Long term (current) use of insulin: Secondary | ICD-10-CM

## 2022-12-19 DIAGNOSIS — Z833 Family history of diabetes mellitus: Secondary | ICD-10-CM

## 2022-12-19 DIAGNOSIS — I10 Essential (primary) hypertension: Secondary | ICD-10-CM | POA: Diagnosis present

## 2022-12-19 DIAGNOSIS — R1115 Cyclical vomiting syndrome unrelated to migraine: Principal | ICD-10-CM | POA: Diagnosis present

## 2022-12-19 DIAGNOSIS — K3184 Gastroparesis: Secondary | ICD-10-CM | POA: Diagnosis present

## 2022-12-19 DIAGNOSIS — E1343 Other specified diabetes mellitus with diabetic autonomic (poly)neuropathy: Secondary | ICD-10-CM | POA: Diagnosis present

## 2022-12-19 DIAGNOSIS — R112 Nausea with vomiting, unspecified: Secondary | ICD-10-CM | POA: Diagnosis present

## 2022-12-19 DIAGNOSIS — F419 Anxiety disorder, unspecified: Secondary | ICD-10-CM | POA: Diagnosis present

## 2022-12-19 DIAGNOSIS — K219 Gastro-esophageal reflux disease without esophagitis: Secondary | ICD-10-CM | POA: Diagnosis present

## 2022-12-19 DIAGNOSIS — K760 Fatty (change of) liver, not elsewhere classified: Secondary | ICD-10-CM | POA: Diagnosis present

## 2022-12-19 DIAGNOSIS — J45909 Unspecified asthma, uncomplicated: Secondary | ICD-10-CM | POA: Diagnosis present

## 2022-12-19 DIAGNOSIS — D259 Leiomyoma of uterus, unspecified: Secondary | ICD-10-CM | POA: Diagnosis present

## 2022-12-19 DIAGNOSIS — Z8249 Family history of ischemic heart disease and other diseases of the circulatory system: Secondary | ICD-10-CM

## 2022-12-19 DIAGNOSIS — E876 Hypokalemia: Secondary | ICD-10-CM | POA: Diagnosis present

## 2022-12-19 DIAGNOSIS — Z8701 Personal history of pneumonia (recurrent): Secondary | ICD-10-CM

## 2022-12-20 ENCOUNTER — Other Ambulatory Visit: Payer: Self-pay

## 2022-12-20 ENCOUNTER — Encounter (HOSPITAL_COMMUNITY): Payer: Self-pay

## 2022-12-20 DIAGNOSIS — R112 Nausea with vomiting, unspecified: Secondary | ICD-10-CM

## 2022-12-20 LAB — CBC WITH DIFFERENTIAL/PLATELET
Abs Immature Granulocytes: 0.02 10*3/uL (ref 0.00–0.07)
Basophils Absolute: 0 10*3/uL (ref 0.0–0.1)
Basophils Relative: 0 %
Eosinophils Absolute: 0.1 10*3/uL (ref 0.0–0.5)
Eosinophils Relative: 1 %
HCT: 37.7 % (ref 36.0–46.0)
Hemoglobin: 12.3 g/dL (ref 12.0–15.0)
Immature Granulocytes: 0 %
Lymphocytes Relative: 20 %
Lymphs Abs: 1.3 10*3/uL (ref 0.7–4.0)
MCH: 29.9 pg (ref 26.0–34.0)
MCHC: 32.6 g/dL (ref 30.0–36.0)
MCV: 91.7 fL (ref 80.0–100.0)
Monocytes Absolute: 0.2 10*3/uL (ref 0.1–1.0)
Monocytes Relative: 4 %
Neutro Abs: 5 10*3/uL (ref 1.7–7.7)
Neutrophils Relative %: 75 %
Platelets: 163 10*3/uL (ref 150–400)
RBC: 4.11 MIL/uL (ref 3.87–5.11)
RDW: 13.2 % (ref 11.5–15.5)
WBC: 6.7 10*3/uL (ref 4.0–10.5)
nRBC: 0 % (ref 0.0–0.2)

## 2022-12-20 LAB — HCG, SERUM, QUALITATIVE: Preg, Serum: NEGATIVE

## 2022-12-20 LAB — COMPREHENSIVE METABOLIC PANEL
ALT: 19 U/L (ref 0–44)
AST: 24 U/L (ref 15–41)
Albumin: 4.1 g/dL (ref 3.5–5.0)
Alkaline Phosphatase: 74 U/L (ref 38–126)
Anion gap: 10 (ref 5–15)
BUN: 12 mg/dL (ref 6–20)
CO2: 23 mmol/L (ref 22–32)
Calcium: 8.7 mg/dL — ABNORMAL LOW (ref 8.9–10.3)
Chloride: 106 mmol/L (ref 98–111)
Creatinine, Ser: 0.7 mg/dL (ref 0.44–1.00)
GFR, Estimated: >60 mL/min (ref >60)
Glucose, Bld: 177 mg/dL — ABNORMAL HIGH (ref 70–99)
Potassium: 2.9 mmol/L — ABNORMAL LOW (ref 3.5–5.1)
Sodium: 139 mmol/L (ref 135–145)
Total Bilirubin: 0.5 mg/dL (ref <1.2)
Total Protein: 7 g/dL (ref 6.5–8.1)

## 2022-12-20 LAB — URINALYSIS, ROUTINE W REFLEX MICROSCOPIC
Bacteria, UA: NONE SEEN
Bilirubin Urine: NEGATIVE
Glucose, UA: >=500 mg/dL — AB
Hgb urine dipstick: NEGATIVE
Ketones, ur: 80 mg/dL — AB
Leukocytes,Ua: NEGATIVE
Nitrite: NEGATIVE
Protein, ur: NEGATIVE mg/dL
Specific Gravity, Urine: 1.015 (ref 1.005–1.030)
pH: 7 (ref 5.0–8.0)

## 2022-12-20 LAB — LIPASE, BLOOD: Lipase: 18 U/L (ref 11–51)

## 2022-12-20 LAB — RAPID URINE DRUG SCREEN, HOSP PERFORMED
Amphetamines: NOT DETECTED
Barbiturates: NOT DETECTED
Benzodiazepines: POSITIVE — AB
Cocaine: NOT DETECTED
Opiates: POSITIVE — AB
Tetrahydrocannabinol: POSITIVE — AB

## 2022-12-20 LAB — PHOSPHORUS: Phosphorus: 1.3 mg/dL — ABNORMAL LOW (ref 2.5–4.6)

## 2022-12-20 LAB — GLUCOSE, CAPILLARY: Glucose-Capillary: 320 mg/dL — ABNORMAL HIGH (ref 70–99)

## 2022-12-20 MED ORDER — POTASSIUM CHLORIDE 10 MEQ/100ML IV SOLN
10.0000 meq | INTRAVENOUS | Status: AC
Start: 2022-12-20 — End: 2022-12-20
  Administered 2022-12-20 (×3): 10 meq via INTRAVENOUS
  Filled 2022-12-20 (×3): qty 100

## 2022-12-20 MED ORDER — MORPHINE SULFATE (PF) 4 MG/ML IV SOLN
4.0000 mg | Freq: Once | INTRAVENOUS | Status: AC
  Administered 2022-12-20: 4 mg via INTRAVENOUS
  Filled 2022-12-20: qty 1

## 2022-12-20 MED ORDER — INSULIN ASPART 100 UNIT/ML IJ SOLN: 0.0000 [IU] | Freq: Three times a day (TID) | INTRAMUSCULAR | Status: DC

## 2022-12-20 MED ORDER — ACETAMINOPHEN 325 MG PO TABS: 650.0000 mg | ORAL_TABLET | Freq: Four times a day (QID) | ORAL | Status: DC | PRN

## 2022-12-20 MED ORDER — BOOST / RESOURCE BREEZE PO LIQD CUSTOM
1.0000 | Freq: Three times a day (TID) | ORAL | Status: DC
  Administered 2022-12-20: 237 mL via ORAL
  Administered 2022-12-21 (×2): 1 via ORAL

## 2022-12-20 MED ORDER — POTASSIUM CHLORIDE IN NACL 20-0.9 MEQ/L-% IV SOLN: INTRAVENOUS | Status: DC

## 2022-12-20 MED ORDER — PANTOPRAZOLE SODIUM 40 MG IV SOLR
40.0000 mg | Freq: Two times a day (BID) | INTRAVENOUS | Status: DC
  Administered 2022-12-20 – 2022-12-22 (×4): 40 mg via INTRAVENOUS
  Filled 2022-12-20 (×4): qty 10

## 2022-12-20 MED ORDER — SODIUM CHLORIDE 0.9 % IV BOLUS
1000.0000 mL | Freq: Once | INTRAVENOUS | Status: AC
  Administered 2022-12-20: 1000 mL via INTRAVENOUS

## 2022-12-20 MED ORDER — POTASSIUM CHLORIDE IN NACL 20-0.9 MEQ/L-% IV SOLN
INTRAVENOUS | Status: AC
  Administered 2022-12-21: 82 mL/h via INTRAVENOUS
  Filled 2022-12-20 (×2): qty 1000

## 2022-12-20 MED ORDER — MAGNESIUM SULFATE 2 GM/50ML IV SOLN
2.0000 g | Freq: Once | INTRAVENOUS | Status: AC
  Administered 2022-12-20: 2 g via INTRAVENOUS
  Filled 2022-12-20: qty 50

## 2022-12-20 MED ORDER — INSULIN ASPART 100 UNIT/ML IJ SOLN: 0.0000 [IU] | Freq: Every day | INTRAMUSCULAR | Status: DC

## 2022-12-20 MED ORDER — ORAL CARE MOUTH RINSE: 15.0000 mL | OROMUCOSAL | Status: DC | PRN

## 2022-12-20 MED ORDER — DROPERIDOL 2.5 MG/ML IJ SOLN
1.2500 mg | Freq: Once | INTRAMUSCULAR | Status: AC
  Administered 2022-12-20: 1.25 mg via INTRAMUSCULAR
  Filled 2022-12-20: qty 2

## 2022-12-20 MED ORDER — PROCHLORPERAZINE EDISYLATE 10 MG/2ML IJ SOLN
10.0000 mg | Freq: Four times a day (QID) | INTRAMUSCULAR | Status: DC | PRN
  Administered 2022-12-20: 10 mg via INTRAVENOUS
  Filled 2022-12-20 (×2): qty 2

## 2022-12-20 MED ORDER — ENOXAPARIN SODIUM 40 MG/0.4ML IJ SOSY
40.0000 mg | PREFILLED_SYRINGE | INTRAMUSCULAR | Status: DC
Start: 2022-12-20 — End: 2022-12-22
  Administered 2022-12-20 – 2022-12-21 (×2): 40 mg via SUBCUTANEOUS
  Filled 2022-12-20 (×2): qty 0.4

## 2022-12-20 MED ORDER — PROCHLORPERAZINE EDISYLATE 10 MG/2ML IJ SOLN
10.0000 mg | Freq: Four times a day (QID) | INTRAMUSCULAR | Status: DC | PRN
Start: 2022-12-20 — End: 2022-12-20

## 2022-12-20 MED ORDER — ACETAMINOPHEN 650 MG RE SUPP: 650.0000 mg | Freq: Four times a day (QID) | RECTAL | Status: DC | PRN

## 2022-12-20 MED ORDER — PROMETHAZINE HCL 25 MG/ML IJ SOLN
25.0000 mg | Freq: Once | INTRAMUSCULAR | Status: AC
  Administered 2022-12-20: 25 mg via INTRAMUSCULAR
  Filled 2022-12-20: qty 1

## 2022-12-20 MED ORDER — SODIUM CHLORIDE 0.9% FLUSH: 10.0000 mL | INTRAVENOUS | Status: DC | PRN

## 2022-12-20 MED ORDER — POTASSIUM PHOSPHATES 15 MMOLE/5ML IV SOLN
30.0000 mmol | Freq: Once | INTRAVENOUS | Status: AC
  Administered 2022-12-20: 30 mmol via INTRAVENOUS
  Filled 2022-12-20: qty 10

## 2022-12-20 MED ORDER — PROCHLORPERAZINE EDISYLATE 10 MG/2ML IJ SOLN
10.0000 mg | Freq: Once | INTRAMUSCULAR | Status: AC
  Administered 2022-12-20: 10 mg via INTRAVENOUS
  Filled 2022-12-20: qty 2

## 2022-12-20 MED ORDER — POTASSIUM CHLORIDE CRYS ER 20 MEQ PO TBCR
40.0000 meq | EXTENDED_RELEASE_TABLET | Freq: Once | ORAL | Status: DC
  Filled 2022-12-20: qty 2

## 2022-12-20 MED ORDER — MORPHINE SULFATE (PF) 4 MG/ML IV SOLN
4.0000 mg | INTRAVENOUS | Status: DC | PRN
  Administered 2022-12-20 – 2022-12-21 (×8): 4 mg via INTRAVENOUS
  Filled 2022-12-20 (×8): qty 1

## 2022-12-20 MED ORDER — PANTOPRAZOLE SODIUM 40 MG IV SOLR
40.0000 mg | Freq: Once | INTRAVENOUS | Status: AC
  Administered 2022-12-20: 40 mg via INTRAVENOUS
  Filled 2022-12-20: qty 10

## 2022-12-20 MED ORDER — PROMETHAZINE HCL 25 MG PO TABS: 25.0000 mg | ORAL_TABLET | Freq: Once | ORAL | Status: DC

## 2022-12-20 MED ORDER — ONDANSETRON HCL 4 MG PO TABS: 4.0000 mg | ORAL_TABLET | Freq: Four times a day (QID) | ORAL | Status: DC | PRN

## 2022-12-20 MED ORDER — ONDANSETRON HCL 4 MG/2ML IJ SOLN
4.0000 mg | Freq: Four times a day (QID) | INTRAMUSCULAR | Status: DC | PRN
  Administered 2022-12-20 (×2): 4 mg via INTRAVENOUS
  Filled 2022-12-20 (×3): qty 2

## 2022-12-20 MED ORDER — METOCLOPRAMIDE HCL 5 MG/ML IJ SOLN
10.0000 mg | Freq: Four times a day (QID) | INTRAMUSCULAR | Status: DC
  Administered 2022-12-20 – 2022-12-22 (×9): 10 mg via INTRAVENOUS
  Filled 2022-12-20 (×8): qty 2

## 2022-12-20 MED ORDER — INSULIN PUMP
Freq: Three times a day (TID) | SUBCUTANEOUS | Status: DC
  Administered 2022-12-20: 1 via SUBCUTANEOUS
  Administered 2022-12-21: 0.34 via SUBCUTANEOUS
  Filled 2022-12-20: qty 1

## 2022-12-20 NOTE — H&P (Signed)
History and Physical    [name] VHQ:[id number] DOB: [birth year] DOA: 12/19/2022 DOS: the patient was seen and examined on 12/20/2022 PCP: Elizabeth Palau, FNP  Patient coming from: Home  Chief Complaint:  Chief Complaint  Patient presents with   Abdominal Pain   HPI: [name] is a 32 y.o. female with medical history significant of poorly controlled IDDM, diabetic gastroparesis, multiple episodes of intractable cyclical vomiting, GERD, hepatic steatosis, history of liver mass, history of acute pancreatitis, history of pneumonia hypertension, asthma, osteoarthritis, heart murmur, hypertension, anxiety, vitamin D deficiency who was recently admitted and discharged twice in the past 2 weeks due to nausea and vomiting in the setting of's observation of gastroparesis. No hematemesis, melena or hematochezia. She denied fever, chills, rhinorrhea, sore throat, wheezing or hemoptysis. No chest pain, palpitations, diaphoresis, PND, orthopnea or pitting edema of the lower extremities. No flank pain, dysuria, frequency or hematuria.  No polyuria, polydipsia, polyphagia or blurred vision.   Lab work: Her urine analysis was hazy with greater than 500 glucose and ketones of 80 mg/dL.  UDS was positive for opiates, benzodiazepines and THC.  CBC was normal.  Serum pregnancy test was negative.  Lipase was normal.  CMP showed a potassium of 2.9 mmol/L, glucose of 177 and calcium of 8.7 mg/dL.  The rest of the CMP measurements were normal.  Phosphorus level was 1.3 mg/dL.  Imaging: CT abdomen/pelvis done on 12/15/2022 6 shows circumferential mural thickening of the distal esophagus, which may be seen in the setting of esophagitis.  Uterine 3.3 x 2.7 rounded mass, which is presumed to be a leiomyoma.   ED course: Initial vital signs were temperature 98 F, pulse 114 respirations 22, BP 135/75 mmHg O2 sat 100% on room air.  The patient received droperidol 1.25 mg IVP, promethazine 25 mg IVPB,  magnesium sulfate 2 g IVPB, morphine 4 mg IVP, prochlorperazine 10 mg IVP, KCl 100 mEq IVPB x 3 and 1000 mL of normal saline bolus.  I added pantoprazole 40 mg IVP.  Review of Systems: As mentioned in the history of present illness. All other systems reviewed and are negative. Past Medical History:  Diagnosis Date   Anxiety    Arthritis    "hands, feet, knees" (12/18/2016)   Asthma    Diabetic gastroparesis (HCC)    Per gastric emptying study 07/09/16 which showed significant delayed gastric emptying.   Gallstones    Gastroparesis    GERD (gastroesophageal reflux disease)    Heart murmur    Hepatic steatosis 11/26/2014   and hepatomegaly   Hypertension    hx (12/18/2016)   Intractable cyclical vomiting syndrome    /notes 12/18/2016   Liver mass 11/26/2014   Pancreatitis, acute 11/26/2014   Pneumonia    "as a teen X 1" (12/18/2016)   Type I diabetes mellitus (HCC) 2007   IDDM.  poorly controlled, multiple admits with DKA   Past Surgical History:  Procedure Laterality Date   CHOLECYSTECTOMY N/A 02/11/2015   Procedure: LAPAROSCOPIC CHOLECYSTECTOMY WITH INTRAOPERATIVE CHOLANGIOGRAM;  Surgeon: Gaynelle Adu, MD;  Location: [facility];  Service: General;  Laterality: N/A;   ESOPHAGOGASTRODUODENOSCOPY (EGD) WITH PROPOFOL Left 09/20/2014   Procedure: ESOPHAGOGASTRODUODENOSCOPY (EGD) WITH PROPOFOL;  Surgeon: Willis Modena, MD;  Location: [facility] ENDOSCOPY;  Service: Endoscopy;  Laterality: Left;   ESOPHAGOGASTRODUODENOSCOPY (EGD) WITH PROPOFOL N/A 03/31/2019   Procedure: ESOPHAGOGASTRODUODENOSCOPY (EGD) WITH PROPOFOL;  Surgeon: Bernette Redbird, MD;  Location: [facility] ENDOSCOPY;  Service: Endoscopy;  Laterality: N/A;   WISDOM TOOTH EXTRACTION  Social History:  reports that she has never smoked. She has never used smokeless tobacco. She reports that she does not currently use drugs after having used the following drugs: Marijuana. She reports that she does not drink alcohol.  Allergies  Allergen Reactions    Peanut-Containing Drug Products Swelling and Other (See Comments)    Reaction:  Swelling of mouth and lips    Food Swelling and Other (See Comments)    Pt is allergic to strawberries.   Reaction:  Swelling of mouth and lips    Ultram [Tramadol] Itching    Family History  Problem Relation Age of Onset   Heart disease Maternal Grandmother    Heart disease Maternal Grandfather    Diabetes Mother    Hyperlipidemia Mother    Hypertension Father    Heart disease Father    Hypertension Paternal Grandmother    Cancer Paternal Grandfather     Prior to Admission medications   Medication Sig Start Date End Date Taking? Authorizing Provider  insulin aspart (NOVOLOG) 100 UNIT/ML injection 50 Units daily. VIA PUMP 12/15/22  Yes [provider]  acetaminophen (TYLENOL) 325 MG tablet Take 2 tablets (650 mg total) by mouth every 6 (six) hours as needed for mild pain (pain score 1-3) (or Fever >/= 101). 12/17/22   Alwyn Ren, MD  albuterol (PROVENTIL HFA;VENTOLIN HFA) 108 (90 Base) MCG/ACT inhaler Inhale 1-2 puffs into the lungs every 6 (six) hours as needed for wheezing or shortness of breath.    [provider]  blood glucose meter kit and supplies Dispense based on patient and insurance preference. Use up to four times daily as directed. (FOR ICD-10 E10.9, E11.9). 12/07/17   Rodolph Bong, MD  insulin aspart (NOVOLOG) 100 UNIT/ML FlexPen Inject 4 Units into the skin 3 (three) times daily with meals. 04/17/19   Darlin Drop, DO  Insulin Pen Needle 31G X 5 MM MISC 30 Units by Does not apply route at bedtime. 12/07/17   Rodolph Bong, MD  lisinopril (ZESTRIL) 2.5 MG tablet Take 2.5 mg by mouth daily.    [provider]  metoCLOPramide (REGLAN) 10 MG tablet Take 1 tablet (10 mg total) by mouth every 8 (eight) hours as needed for nausea. 12/17/22 01/16/23  Alwyn Ren, MD  pantoprazole (PROTONIX) 40 MG tablet Take 1 tablet (40 mg total) by mouth  daily for 14 days. 12/12/22 12/26/22  Osvaldo Shipper, MD  promethazine (PHENERGAN) 25 MG tablet Take 1 tablet (25 mg total) by mouth every 6 (six) hours as needed for nausea or vomiting. 12/17/22   Alwyn Ren, MD    Physical Exam: Vitals:   12/20/22 0530 12/20/22 0600 12/20/22 0630 12/20/22 0700  BP: 131/68 137/81 122/71 134/78  Pulse: (!) 105 (!) 109 (!) 107 (!) 105  Resp:  18 15   Temp:      TempSrc:      SpO2: 100% 100% 99% 99%   Physical Exam Vitals and nursing note reviewed.  Constitutional:      General: She is awake. She is not in acute distress.    Appearance: She is well-developed and normal weight. She is ill-appearing.  HENT:     Head: Normocephalic.     Nose: No rhinorrhea.     Mouth/Throat:     Mouth: Mucous membranes are dry.  Eyes:     General: No scleral icterus.    Pupils: Pupils are equal, round, and reactive to light.  Neck:  Vascular: No JVD.  Cardiovascular:     Rate and Rhythm: Regular rhythm. Tachycardia present.     Heart sounds: S1 normal and S2 normal.  Pulmonary:     Effort: Pulmonary effort is normal.     Breath sounds: Normal breath sounds. No wheezing, rhonchi or rales.  Abdominal:     General: There is no distension.     Palpations: Abdomen is soft.     Tenderness: There is abdominal tenderness in the epigastric area. There is no right CVA tenderness, left CVA tenderness, guarding or rebound.  Musculoskeletal:     Cervical back: Neck supple.     Right lower leg: No edema.     Left lower leg: No edema.  Skin:    General: Skin is warm and dry.  Neurological:     General: No focal deficit present.     Mental Status: She is alert and oriented to person, place, and time.  Psychiatric:        Behavior: Behavior is cooperative.     Data Reviewed:  Results are pending, will review when available. 04/04/2019 transthoracic echocardiogram IMPRESSIONS:   1. Left ventricular ejection fraction, by estimation, is 65 to 70%. The   left ventricle has normal function. The left ventricle has no regional  wall motion abnormalities. There is mild left ventricular hypertrophy.  Left ventricular diastolic parameters  were normal.   2. Right ventricular systolic function is normal. The right ventricular  size is normal.   3. Moderate pleural effusion in the left lateral region.   4. The mitral valve is abnormal. Mild mitral valve regurgitation.   5. The aortic valve is tricuspid. Aortic valve regurgitation is not  visualized.   6. The inferior vena cava is small, <1.2 cm and spontaneously collapses,  suggesting volume depletion and a low RA pressure <3 mmHg.   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 Continue IV fluids. Clear liquid diet. Analgesics as needed. Antiemetics as needed. Switch metoclopramide to IV form. Pantoprazole 40 mg IVP daily. Follow CBC, CMP and lipase in AM.  Active Problems:   Asthma Bronchodilators as needed.    Hypophosphatemia Replacing.    Hypocalcemia Recheck calcium level in AM. Given hypophosphatemia, check vitamin D level.    Hypokalemia Replacing. Follow potassium level. Magnesium was supplemented.    Hepatic steatosis history LFTs are normal. Hepatic imaging unremarkable.    GERD (gastroesophageal reflux disease) On parenteral PPI.    HTN (hypertension) Blood pressure is soft. Hold lisinopril for now.    Advance Care Planning:   Code Status: Full Code   Consults:   Family Communication:   Severity of Illness: The appropriate patient status for this patient is OBSERVATION. Observation status is judged to be reasonable and necessary in order to provide the required intensity of service to ensure the patient's safety. The patient's presenting symptoms, physical exam findings, and initial radiographic and laboratory data in the context of  their medical condition is felt to place them at decreased risk for further clinical deterioration. Furthermore, it is anticipated that the patient will be medically stable for discharge from the hospital within 2 midnights of admission.   Author: Bobette Mo, MD 12/20/2022 8:35 AM  For on call review [email or web address].   This document was prepared using Dragon voice recognition software and may contain some unintended transcription errors.

## 2022-12-20 NOTE — ED Provider Notes (Signed)
[place] EMERGENCY DEPARTMENT AT [facility] Provider Note   CSN: [id number] Arrival date & time: 12/19/22  2353     History  Chief Complaint  Patient presents with   Abdominal Pain    [name] is a 32 y.o. female.  32 year old female with history of gastroparesis, insulin dependent diabetes, asthma, pancreatitis, hepatic steatosis, cyclic vomiting syndrome presents with mom from home with vomiting. Reports recurrent in nature, with abdominal pain, no changes in chronic symptoms. Denies fevers. States morphine and phenergan is the only thing that helps.        Home Medications Prior to Admission medications   Medication Sig Start Date End Date Taking? Authorizing Provider  insulin aspart (NOVOLOG) 100 UNIT/ML injection 50 Units daily. VIA PUMP 12/15/22  Yes [provider]  acetaminophen (TYLENOL) 325 MG tablet Take 2 tablets (650 mg total) by mouth every 6 (six) hours as needed for mild pain (pain score 1-3) (or Fever >/= 101). 12/17/22   Alwyn Ren, MD  albuterol (PROVENTIL HFA;VENTOLIN HFA) 108 (90 Base) MCG/ACT inhaler Inhale 1-2 puffs into the lungs every 6 (six) hours as needed for wheezing or shortness of breath.    [provider]  blood glucose meter kit and supplies Dispense based on patient and insurance preference. Use up to four times daily as directed. (FOR ICD-10 E10.9, E11.9). 12/07/17   Rodolph Bong, MD  insulin aspart (NOVOLOG) 100 UNIT/ML FlexPen Inject 4 Units into the skin 3 (three) times daily with meals. 04/17/19   Darlin Drop, DO  Insulin Pen Needle 31G X 5 MM MISC 30 Units by Does not apply route at bedtime. 12/07/17   Rodolph Bong, MD  lisinopril (ZESTRIL) 2.5 MG tablet Take 2.5 mg by mouth daily.    [provider]  metoCLOPramide (REGLAN) 10 MG tablet Take 1 tablet (10 mg total) by mouth every 8 (eight) hours as needed for nausea. 12/17/22 01/16/23  Alwyn Ren, MD   pantoprazole (PROTONIX) 40 MG tablet Take 1 tablet (40 mg total) by mouth daily for 14 days. 12/12/22 12/26/22  Osvaldo Shipper, MD  promethazine (PHENERGAN) 25 MG tablet Take 1 tablet (25 mg total) by mouth every 6 (six) hours as needed for nausea or vomiting. 12/17/22   Alwyn Ren, MD      Allergies    Peanut-containing drug products, Food, and Ultram [tramadol]    Review of Systems   Review of Systems Negative except as per HPI Physical Exam Updated Vital Signs BP 138/82 (BP Location: Left Arm)   Pulse (!) 105   Temp 97.9 F (36.6 C) (Oral)   Resp 16   SpO2 99%  Physical Exam Vitals and nursing note reviewed.  Constitutional:      General: She is not in acute distress.    Appearance: She is well-developed. She is not diaphoretic.     Comments: Actively vomiting  HENT:     Head: Normocephalic and atraumatic.  Cardiovascular:     Rate and Rhythm: Normal rate and regular rhythm.     Heart sounds: Normal heart sounds.  Pulmonary:     Effort: Pulmonary effort is normal.     Breath sounds: Normal breath sounds.  Abdominal:     Palpations: Abdomen is soft.     Tenderness: There is generalized abdominal tenderness.  Skin:    General: Skin is warm and dry.     Findings: No erythema or rash.  Neurological:  Mental Status: She is alert and oriented to person, place, and time.  Psychiatric:        Behavior: Behavior normal.     ED Results / Procedures / Treatments   Labs (all labs ordered are listed, but only abnormal results are displayed) Labs Reviewed  COMPREHENSIVE METABOLIC PANEL - Abnormal; Notable for the following components:      Result Value   Potassium 2.9 (*)    Glucose, Bld 177 (*)    Calcium 8.7 (*)    All other components within normal limits  CBC WITH DIFFERENTIAL/PLATELET  LIPASE, BLOOD  HCG, SERUM, QUALITATIVE  URINALYSIS, ROUTINE W REFLEX MICROSCOPIC  RAPID URINE DRUG SCREEN, HOSP PERFORMED    EKG None  Radiology No results  found.  Procedures .Critical Care  Performed by: Jeannie Fend, PA-C Authorized by: Jeannie Fend, PA-C   Critical care provider statement:    Critical care time (minutes):  30   Critical care was necessary to treat or prevent imminent or life-threatening deterioration of the following conditions:  Metabolic crisis   Critical care was time spent personally by me on the following activities:  Development of treatment plan with patient or surrogate, discussions with consultants, evaluation of patient's response to treatment, examination of patient, ordering and review of laboratory studies, ordering and review of radiographic studies, ordering and performing treatments and interventions, pulse oximetry, re-evaluation of patient's condition and review of old charts     Medications Ordered in ED Medications  sodium chloride flush (NS) 0.9 % injection 10-40 mL (has no administration in time range)  potassium chloride 10 mEq in 100 mL IVPB (10 mEq Intravenous New Bag/Given 12/20/22 0525)  potassium chloride SA (KLOR-CON M) CR tablet 40 mEq (40 mEq Oral Patient Refused/Not Given 12/20/22 0338)  droperidol (INAPSINE) 2.5 MG/ML injection 1.25 mg (1.25 mg Intramuscular Given 12/20/22 0046)  sodium chloride 0.9 % bolus 1,000 mL (0 mLs Intravenous Stopped 12/20/22 0520)  prochlorperazine (COMPAZINE) injection 10 mg (10 mg Intravenous Given 12/20/22 0244)  morphine (PF) 4 MG/ML injection 4 mg (4 mg Intravenous Given 12/20/22 0243)  magnesium sulfate IVPB 2 g 50 mL (2 g Intravenous New Bag/Given 12/20/22 0457)  morphine (PF) 4 MG/ML injection 4 mg (4 mg Intravenous Given 12/20/22 0435)  promethazine (PHENERGAN) injection 25 mg (25 mg Intramuscular Given 12/20/22 0553)    ED Course/ Medical Decision Making/ A&P                                 Medical Decision Making Amount and/or Complexity of Data Reviewed Labs: ordered.  Risk Prescription drug management. Decision regarding  hospitalization.   This patient presents to the ED for concern of vomiting, this involves an extensive number of treatment options, and is a complaint that carries with it a high risk of complications and morbidity.  The differential diagnosis includes but not limited to diabetic gastroparesis, cannabinol hyperemesis, metabolic or electrolyte disturbance, DKA.   Co morbidities that complicate the patient evaluation  As reviewed in HPI   Additional history obtained:  Additional history obtained from mom at bedside who contributes to history as above External records from outside source obtained and reviewed including EKG dated 12/15/2022 with normal QT.  CT report from 12/15/2022 with esophagitis and uterine fibroid.   Lab Tests:  I Ordered, and personally interpreted labs.  The pertinent results include: CMP with potassium of 2.9, gradual downtrend over prior admissions.  Not in DKA.  Lipase normal.  hCG negative.  CBC without significant findings.   Consultations Obtained:  I requested consultation with the hospitalist, Dr. Antionette Char,  and discussed lab and imaging findings as well as pertinent plan - they recommend: will consult for admission, likely dayteam to admit    Problem List / ED Course / Critical interventions / Medication management  32 year old female with history of type 1 diabetes, gastroparesis, cyclic vomiting returns to the ER with ongoing vomiting.  Patient was admitted to the hospital November 19 through 22 for same, return to the emergency room and was again admitted November 26 through the 28th.  She had a CT abdomen pelvis with contrast December 15, 2022 which showed esophagitis and likely uterine fibroid.  She returns to the ER today with ongoing nonbloody emesis.  She states only thing that seems to help with her symptoms is morphine and Phenergan.  Patient is very challenging to obtain IV access on.  Access was eventually achieved, she was found to be hypokalemic  with a potassium of 2.9.  She is provided with IM droperidol with limited relief, vomiting resumed.  She was then provided with Compazine which also provided brief relief before vomiting again resumed.  Plan is to admit for intractable vomiting, IM Phenergan has been ordered.  Patient is unable to tolerate p.o.'s, is provided with IV potassium replacement as well as magnesium. Reevaluation of the patient after these medicines showed that the patient stayed the same I have reviewed the patients home medicines and have made adjustments as needed   Social Determinants of Health:  Lives at home   Test / Admission - Considered:  admit         Final Clinical Impression(s) / ED Diagnoses Final diagnoses:  Intractable vomiting  Gastroparesis  Hypokalemia    Rx / DC Orders ED Discharge Orders     None         Alden Hipp 12/20/22 1610    Palumbo, April, MD 12/20/22 [phone]

## 2022-12-20 NOTE — ED Notes (Addendum)
12:50 AM  RN attempted to start IV and get labs. Patient is a difficult stick. RN unsuccessful. Order for IV team placed. Patient actively vomiting and flailing in stretcher. Reports abdominal pains. Gerome Apley Eulah Pont, PA-C notified that RN unable to get labs and start IV and PA-C notified of patient's active vomiting. Awaiting orders.   1:09 AM  IV team present at bedside.   3:30 AM  RN attempted to give patient po potassium, but patient declines stating, "I don't want to swallow it. It's going to make me sick and I'm going to start vomiting again." Patient informed that her potassium is low at 2.9 and that she needs both IV potassium and po potassium to increase the levels.  4:23 AM  IV team present at bedside.  5:50 AM  RN attempted to po challenge patient. Patient actively vomiting at bedside. Failed po challenge. Gerome Apley Eulah Pont, PA-C at bedside and aware.   6:05 AM  Insulin pump removed.

## 2022-12-20 NOTE — ED Notes (Signed)
1:09 AM  IV team present at bedside.

## 2022-12-20 NOTE — ED Notes (Signed)
ED TO INPATIENT HANDOFF REPORT  ED Nurse Name and Phone #: Richarda Osmond Name/Age/Gender [name] 32 y.o. female Room/Bed: WA12/WA12  Code Status   Code Status: Full Code  Home/SNF/Other Home Patient oriented to: self, place, time, and situation Is this baseline? Yes   Triage Complete: Triage complete  Chief Complaint Intractable nausea and vomiting [R11.2]  Triage Note Pt to ED with c/o NVD which began an hour ago. Pt has gastroparesis and states that a recent food poisoning episode has triggered her symptoms. Rates pain 10/10.    Allergies Allergies  Allergen Reactions   Peanut-Containing Drug Products Swelling and Other (See Comments)    Reaction:  Swelling of mouth and lips    Food Swelling and Other (See Comments)    Pt is allergic to strawberries.   Reaction:  Swelling of mouth and lips    Ultram [Tramadol] Itching    Level of Care/Admitting Diagnosis ED Disposition     ED Disposition  Admit   Condition  --   Comment  Hospital Area: [facility] [[id number]]  Level of Care: Telemetry [5]  Admit to tele based on following criteria: Monitor QTC interval  May place patient in observation at [facility] or [facility] if equivalent level of care is available:: Yes  Covid Evaluation: Asymptomatic - no recent exposure (last 10 days) testing not required  Diagnosis: Intractable nausea and vomiting [720114]  Admitting Physician: Briscoe Deutscher [[id number]]  Attending Physician: Briscoe Deutscher [[id number]]          B Medical/Surgery History Past Medical History:  Diagnosis Date   Anxiety    Arthritis    "hands, feet, knees" (12/18/2016)   Asthma    Diabetic gastroparesis (HCC)    Per gastric emptying study 07/09/16 which showed significant delayed gastric emptying.   Gallstones    Gastroparesis    GERD (gastroesophageal reflux disease)    Heart murmur    Hepatic steatosis 11/26/2014   and hepatomegaly   Hypertension    hx  (12/18/2016)   Intractable cyclical vomiting syndrome    /notes 12/18/2016   Liver mass 11/26/2014   Pancreatitis, acute 11/26/2014   Pneumonia    "as a teen X 1" (12/18/2016)   Type I diabetes mellitus (HCC) 2007   IDDM.  poorly controlled, multiple admits with DKA   Past Surgical History:  Procedure Laterality Date   CHOLECYSTECTOMY N/A 02/11/2015   Procedure: LAPAROSCOPIC CHOLECYSTECTOMY WITH INTRAOPERATIVE CHOLANGIOGRAM;  Surgeon: Gaynelle Adu, MD;  Location: [facility];  Service: General;  Laterality: N/A;   ESOPHAGOGASTRODUODENOSCOPY (EGD) WITH PROPOFOL Left 09/20/2014   Procedure: ESOPHAGOGASTRODUODENOSCOPY (EGD) WITH PROPOFOL;  Surgeon: Willis Modena, MD;  Location: [facility]COPY;  Service: Endoscopy;  Laterality: Left;   ESOPHAGOGASTRODUODENOSCOPY (EGD) WITH PROPOFOL N/A 03/31/2019   Procedure: ESOPHAGOGASTRODUODENOSCOPY (EGD) WITH PROPOFOL;  Surgeon: Bernette Redbird, MD;  Location: [facility];  Service: Endoscopy;  Laterality: N/A;   WISDOM TOOTH EXTRACTION       A IV Location/Drains/Wounds Patient Lines/Drains/Airways Status     Active Line/Drains/Airways     Name Placement date Placement time Site Days   Peripheral IV 12/20/22 20 G 2.5" Anterior;Right Forearm 12/20/22  0449  Forearm  less than 1   Midline Single Lumen 12/20/22 Right Basilic 10 cm 0 cm 12/20/22  1914  Basilic  less than 1            Intake/Output Last 24 hours No intake or output data in the 24 hours ending 12/20/22 1238  Labs/Imaging Results for orders placed or performed during the hospital encounter of 12/19/22 (from the past 48 hour(s))  CBC with Differential     Status: None   Collection Time: 12/20/22  1:41 AM  Result Value Ref Range   WBC 6.7 4.0 - 10.5 K/uL   RBC 4.11 3.87 - 5.11 MIL/uL   Hemoglobin 12.3 12.0 - 15.0 g/dL   HCT 86.5 78.4 - 69.6 %   MCV 91.7 80.0 - 100.0 fL   MCH 29.9 26.0 - 34.0 pg   MCHC 32.6 30.0 - 36.0 g/dL   RDW 29.5 28.4 - 13.2 %   Platelets 163 150 - 400 K/uL     Comment: SPECIMEN CHECKED FOR CLOTS REPEATED TO VERIFY    nRBC 0.0 0.0 - 0.2 %   Neutrophils Relative % 75 %   Neutro Abs 5.0 1.7 - 7.7 K/uL   Lymphocytes Relative 20 %   Lymphs Abs 1.3 0.7 - 4.0 K/uL   Monocytes Relative 4 %   Monocytes Absolute 0.2 0.1 - 1.0 K/uL   Eosinophils Relative 1 %   Eosinophils Absolute 0.1 0.0 - 0.5 K/uL   Basophils Relative 0 %   Basophils Absolute 0.0 0.0 - 0.1 K/uL   Immature Granulocytes 0 %   Abs Immature Granulocytes 0.02 0.00 - 0.07 K/uL    Comment: Performed at [facility], [address] [place], [place] [address]  Comprehensive metabolic panel     Status: Abnormal   Collection Time: 12/20/22  1:41 AM  Result Value Ref Range   Sodium 139 135 - 145 mmol/L   Potassium 2.9 (L) 3.5 - 5.1 mmol/L   Chloride 106 98 - 111 mmol/L   CO2 23 22 - 32 mmol/L   Glucose, Bld 177 (H) 70 - 99 mg/dL    Comment: Glucose reference range applies only to samples taken after fasting for at least 8 hours.   Braun 12 6 - 20 mg/dL   Creatinine, Ser 2.72 0.44 - 1.00 mg/dL   Calcium 8.7 (L) 8.9 - 10.3 mg/dL   Total Protein 7.0 6.5 - 8.1 g/dL   Albumin 4.1 3.5 - 5.0 g/dL   AST 24 15 - 41 U/L   ALT 19 0 - 44 U/L   Alkaline Phosphatase 74 38 - 126 U/L   Total Bilirubin 0.5 <1.2 mg/dL   GFR, Estimated >53 >66 mL/min    Comment: (NOTE) Calculated using the CKD-EPI Creatinine Equation (2021)    Anion gap 10 5 - 15    Comment: Performed at [facility], [address] [place], [place] [address]  Lipase, blood     Status: None   Collection Time: 12/20/22  1:41 AM  Result Value Ref Range   Lipase 18 11 - 51 U/L    Comment: Performed at [facility], [address], [place], [place] [address]  hCG, serum, qualitative     Status: None   Collection Time: 12/20/22  1:41 AM  Result Value Ref Range   Preg, Serum NEGATIVE NEGATIVE    Comment:        THE SENSITIVITY OF THIS METHODOLOGY IS >10 mIU/mL. Performed at  [facility], [address] [place], [place] [address]   Phosphorus     Status: Abnormal   Collection Time: 12/20/22  1:41 AM  Result Value Ref Range   Phosphorus 1.3 (L) 2.5 - 4.6 mg/dL    Comment: Performed at [facility], [address], [place], [place] [address]  Urinalysis, Routine w reflex microscopic -Urine, Clean Catch     Status: Abnormal   Collection Time: 12/20/22  7:31 AM  Result Value Ref Range   Color, Urine YELLOW YELLOW   APPearance HAZY (A) CLEAR   Specific Gravity, Urine 1.015 1.005 - 1.030   pH 7.0 5.0 - 8.0   Glucose, UA >=500 (A) NEGATIVE mg/dL   Hgb urine dipstick NEGATIVE NEGATIVE   Bilirubin Urine NEGATIVE NEGATIVE   Ketones, ur 80 (A) NEGATIVE mg/dL   Protein, ur NEGATIVE NEGATIVE mg/dL   Nitrite NEGATIVE NEGATIVE   Leukocytes,Ua NEGATIVE NEGATIVE   RBC / HPF 0-5 0 - 5 RBC/hpf   WBC, UA 0-5 0 - 5 WBC/hpf   Bacteria, UA NONE SEEN NONE SEEN   Squamous Epithelial / HPF 0-5 0 - 5 /HPF   Mucus PRESENT     Comment: Performed at [facility], [address] [place], [place] [address]  Urine rapid drug screen (hosp performed)     Status: Abnormal   Collection Time: 12/20/22  7:31 AM  Result Value Ref Range   Opiates POSITIVE (A) NONE DETECTED   Cocaine NONE DETECTED NONE DETECTED   Benzodiazepines POSITIVE (A) NONE DETECTED   Amphetamines NONE DETECTED NONE DETECTED   Tetrahydrocannabinol POSITIVE (A) NONE DETECTED   Barbiturates NONE DETECTED NONE DETECTED    Comment: (NOTE) DRUG SCREEN FOR MEDICAL PURPOSES ONLY.  IF CONFIRMATION IS NEEDED FOR ANY PURPOSE, NOTIFY LAB WITHIN 5 DAYS.  LOWEST DETECTABLE LIMITS FOR URINE DRUG SCREEN Drug Class                     Cutoff (ng/mL) Amphetamine and metabolites    1000 Barbiturate and metabolites    200 Benzodiazepine                 200 Opiates and metabolites        300 Cocaine and metabolites        300 THC                             50 Performed at [facility], [address], [place], [place] [address]    No results found.  Pending Labs Unresulted Labs (From admission, onward)     Start     Ordered   12/21/22 0500  Comprehensive metabolic panel  Tomorrow morning,   R        12/20/22 0923   12/21/22 0500  CBC  Tomorrow morning,   R        12/20/22 0923            Vitals/Pain Today's Vitals   12/20/22 0728 12/20/22 0838 12/20/22 0848 12/20/22 1035  BP:    108/67  Pulse:    83  Resp:    17  Temp:   98 F (36.7 C) 98 F (36.7 C)  TempSrc:   Oral Oral  SpO2:    91%  PainSc: 3  10-Worst pain ever      Isolation Precautions No active isolations  Medications Medications  sodium chloride flush (NS) 0.9 % injection 10-40 mL (has no administration in time range)  0.9 % NaCl with KCl 20 mEq/ L  infusion (0 mL/hr Intravenous Hold 12/20/22 0840)  potassium PHOSPHATE 30 mmol in dextrose 5 % 500 mL infusion (has no administration in time range)  morphine (PF) 4 MG/ML injection 4  mg (4 mg Intravenous Given 12/20/22 1155)  metoCLOPramide (REGLAN) injection 10 mg (10 mg Intravenous Given 12/20/22 1156)  enoxaparin (LOVENOX) injection 40 mg (has no administration in time range)  acetaminophen (TYLENOL) tablet 650 mg (has no administration in time range)    Or  acetaminophen (TYLENOL) suppository 650 mg (has no administration in time range)  ondansetron (ZOFRAN) tablet 4 mg (has no administration in time range)    Or  ondansetron (ZOFRAN) injection 4 mg (has no administration in time range)  prochlorperazine (COMPAZINE) injection 10 mg (has no administration in time range)  pantoprazole (PROTONIX) injection 40 mg (has no administration in time range)  droperidol (INAPSINE) 2.5 MG/ML injection 1.25 mg (1.25 mg Intramuscular Given 12/20/22 0046)  sodium chloride 0.9 % bolus 1,000 mL (0 mLs Intravenous Stopped 12/20/22 0520)  prochlorperazine (COMPAZINE) injection 10 mg (10 mg Intravenous Given  12/20/22 0244)  morphine (PF) 4 MG/ML injection 4 mg (4 mg Intravenous Given 12/20/22 0243)  potassium chloride 10 mEq in 100 mL IVPB (0 mEq Intravenous Stopped 12/20/22 0649)  magnesium sulfate IVPB 2 g 50 mL (0 g Intravenous Stopped 12/20/22 0622)  morphine (PF) 4 MG/ML injection 4 mg (4 mg Intravenous Given 12/20/22 0435)  promethazine (PHENERGAN) injection 25 mg (25 mg Intramuscular Given 12/20/22 0553)  pantoprazole (PROTONIX) injection 40 mg (40 mg Intravenous Given 12/20/22 0853)    Mobility walks     Focused Assessments    R Recommendations: See Admitting Provider Note  Report given to:   Additional Notes:

## 2022-12-20 NOTE — ED Notes (Signed)
Unable to obtain temperature and blood pressure due to patient vomiting aggressively.

## 2022-12-20 NOTE — ED Triage Notes (Signed)
Pt to ED with c/o NVD which began an hour ago. Pt has gastroparesis and states that a recent food poisoning episode has triggered her symptoms. Rates pain 10/10.

## 2022-12-20 NOTE — Plan of Care (Signed)

## 2022-12-21 DIAGNOSIS — K760 Fatty (change of) liver, not elsewhere classified: Secondary | ICD-10-CM | POA: Diagnosis present

## 2022-12-21 DIAGNOSIS — Z8701 Personal history of pneumonia (recurrent): Secondary | ICD-10-CM | POA: Diagnosis not present

## 2022-12-21 DIAGNOSIS — Z794 Long term (current) use of insulin: Secondary | ICD-10-CM | POA: Diagnosis not present

## 2022-12-21 DIAGNOSIS — Z79899 Other long term (current) drug therapy: Secondary | ICD-10-CM | POA: Diagnosis not present

## 2022-12-21 DIAGNOSIS — J45909 Unspecified asthma, uncomplicated: Secondary | ICD-10-CM | POA: Diagnosis present

## 2022-12-21 DIAGNOSIS — Z91010 Allergy to peanuts: Secondary | ICD-10-CM | POA: Diagnosis not present

## 2022-12-21 DIAGNOSIS — F419 Anxiety disorder, unspecified: Secondary | ICD-10-CM | POA: Diagnosis present

## 2022-12-21 DIAGNOSIS — I10 Essential (primary) hypertension: Secondary | ICD-10-CM | POA: Diagnosis present

## 2022-12-21 DIAGNOSIS — E876 Hypokalemia: Secondary | ICD-10-CM | POA: Diagnosis present

## 2022-12-21 DIAGNOSIS — Z882 Allergy status to sulfonamides status: Secondary | ICD-10-CM | POA: Diagnosis not present

## 2022-12-21 DIAGNOSIS — Z833 Family history of diabetes mellitus: Secondary | ICD-10-CM | POA: Diagnosis not present

## 2022-12-21 DIAGNOSIS — D259 Leiomyoma of uterus, unspecified: Secondary | ICD-10-CM | POA: Diagnosis present

## 2022-12-21 DIAGNOSIS — E1043 Type 1 diabetes mellitus with diabetic autonomic (poly)neuropathy: Secondary | ICD-10-CM | POA: Diagnosis present

## 2022-12-21 DIAGNOSIS — M159 Polyosteoarthritis, unspecified: Secondary | ICD-10-CM | POA: Diagnosis present

## 2022-12-21 DIAGNOSIS — Z8249 Family history of ischemic heart disease and other diseases of the circulatory system: Secondary | ICD-10-CM | POA: Diagnosis not present

## 2022-12-21 DIAGNOSIS — F1210 Cannabis abuse, uncomplicated: Secondary | ICD-10-CM | POA: Diagnosis present

## 2022-12-21 DIAGNOSIS — R112 Nausea with vomiting, unspecified: Secondary | ICD-10-CM | POA: Diagnosis present

## 2022-12-21 DIAGNOSIS — K2100 Gastro-esophageal reflux disease with esophagitis, without bleeding: Secondary | ICD-10-CM | POA: Diagnosis present

## 2022-12-21 DIAGNOSIS — Z83438 Family history of other disorder of lipoprotein metabolism and other lipidemia: Secondary | ICD-10-CM | POA: Diagnosis not present

## 2022-12-21 DIAGNOSIS — R1115 Cyclical vomiting syndrome unrelated to migraine: Secondary | ICD-10-CM | POA: Diagnosis present

## 2022-12-21 DIAGNOSIS — R197 Diarrhea, unspecified: Secondary | ICD-10-CM | POA: Diagnosis present

## 2022-12-21 DIAGNOSIS — K3184 Gastroparesis: Secondary | ICD-10-CM | POA: Diagnosis present

## 2022-12-21 LAB — CBC
HCT: 30.7 % — ABNORMAL LOW (ref 36.0–46.0)
Hemoglobin: 10.4 g/dL — ABNORMAL LOW (ref 12.0–15.0)
MCH: 30.9 pg (ref 26.0–34.0)
MCHC: 33.9 g/dL (ref 30.0–36.0)
MCV: 91.1 fL (ref 80.0–100.0)
Platelets: 310 10*3/uL (ref 150–400)
RBC: 3.37 MIL/uL — ABNORMAL LOW (ref 3.87–5.11)
RDW: 13.9 % (ref 11.5–15.5)
WBC: 11.6 10*3/uL — ABNORMAL HIGH (ref 4.0–10.5)
nRBC: 0 % (ref 0.0–0.2)

## 2022-12-21 LAB — GLUCOSE, CAPILLARY
Glucose-Capillary: 69 mg/dL — ABNORMAL LOW (ref 70–99)
Glucose-Capillary: 83 mg/dL (ref 70–99)
Glucose-Capillary: 142 mg/dL — ABNORMAL HIGH (ref 70–99)
Glucose-Capillary: 144 mg/dL — ABNORMAL HIGH (ref 70–99)
Glucose-Capillary: 157 mg/dL — ABNORMAL HIGH (ref 70–99)

## 2022-12-21 LAB — COMPREHENSIVE METABOLIC PANEL
ALT: 17 U/L (ref 0–44)
AST: 21 U/L (ref 15–41)
Albumin: 3.2 g/dL — ABNORMAL LOW (ref 3.5–5.0)
Alkaline Phosphatase: 62 U/L (ref 38–126)
Anion gap: 3 — ABNORMAL LOW (ref 5–15)
BUN: 14 mg/dL (ref 6–20)
CO2: 21 mmol/L — ABNORMAL LOW (ref 22–32)
Calcium: 8 mg/dL — ABNORMAL LOW (ref 8.9–10.3)
Chloride: 110 mmol/L (ref 98–111)
Creatinine, Ser: 0.76 mg/dL (ref 0.44–1.00)
GFR, Estimated: >60 mL/min (ref >60)
Glucose, Bld: 72 mg/dL (ref 70–99)
Potassium: 4.1 mmol/L (ref 3.5–5.1)
Sodium: 134 mmol/L — ABNORMAL LOW (ref 135–145)
Total Bilirubin: 0.6 mg/dL (ref <1.2)
Total Protein: 5.5 g/dL — ABNORMAL LOW (ref 6.5–8.1)

## 2022-12-21 LAB — VITAMIN D 25 HYDROXY (VIT D DEFICIENCY, FRACTURES): Vit D, 25-Hydroxy: 8.46 ng/mL — ABNORMAL LOW (ref 30–100)

## 2022-12-21 MED ORDER — VITAMIN D (ERGOCALCIFEROL) 1.25 MG (50000 UNIT) PO CAPS
50000.0000 [IU] | ORAL_CAPSULE | ORAL | Status: DC
  Administered 2022-12-21: 50000 [IU] via ORAL
  Filled 2022-12-21: qty 1

## 2022-12-21 MED ORDER — SODIUM PHOSPHATES 45 MMOLE/15ML IV SOLN
30.0000 mmol | Freq: Once | INTRAVENOUS | Status: AC
  Administered 2022-12-21: 30 mmol via INTRAVENOUS
  Filled 2022-12-21: qty 10

## 2022-12-21 MED ORDER — ONDANSETRON HCL 4 MG PO TABS
4.0000 mg | ORAL_TABLET | Freq: Three times a day (TID) | ORAL | Status: DC
  Administered 2022-12-21 – 2022-12-22 (×4): 4 mg via ORAL
  Filled 2022-12-21 (×4): qty 1

## 2022-12-21 MED ORDER — MORPHINE SULFATE (PF) 2 MG/ML IV SOLN
2.0000 mg | INTRAVENOUS | Status: DC | PRN
  Administered 2022-12-21 – 2022-12-22 (×4): 2 mg via INTRAVENOUS
  Filled 2022-12-21 (×5): qty 1

## 2022-12-21 MED ORDER — ONDANSETRON HCL 4 MG/2ML IJ SOLN: 4.0000 mg | Freq: Three times a day (TID) | INTRAMUSCULAR | Status: DC

## 2022-12-21 NOTE — Plan of Care (Signed)

## 2022-12-21 NOTE — Consult Note (Signed)
[facility] Consultation Note  Referring Provider: [facility] Primary Care Physician:  Elizabeth Palau, FNP Primary Gastroenterologist:  [name]  Reason for Consultation:  nausea and vomiting  HPI: [name] is a 32 y.o. female admitted nausea/vomiting.  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  Multiple prior hospitalizations over the years.  However had been doing well for the past 3 years (with intermittent flares, not requiring hospitalization, in the interim) until about 3 weeks ago.  At that time, she has acute onset nausea, vomiting diarrhea.  Has been in and out of hospital and ED a few times since then.  Imaging (CT) done showing leiomyoma uterus as well as thickening distal esophagus likely esophagitis.  Couple prior endoscopies, last in 2021.   Past Medical History:  Diagnosis Date   Anxiety    Arthritis    "hands, feet, knees" (12/18/2016)   Asthma    Diabetic gastroparesis (HCC)    Per gastric emptying study 07/09/16 which showed significant delayed gastric emptying.   Gallstones    Gastroparesis    GERD (gastroesophageal reflux disease)    Heart murmur    Hepatic steatosis 11/26/2014   and hepatomegaly   Hypertension    hx (12/18/2016)   Intractable cyclical vomiting syndrome    /notes 12/18/2016   Liver mass 11/26/2014   Pancreatitis, acute 11/26/2014   Pneumonia    "as a teen X 1" (12/18/2016)   Type I diabetes mellitus (HCC) 2007   IDDM.  poorly controlled, multiple admits with DKA   Vitamin D deficiency 07/24/2016    Past Surgical History:  Procedure Laterality Date   CHOLECYSTECTOMY N/A 02/11/2015   Procedure: LAPAROSCOPIC CHOLECYSTECTOMY WITH INTRAOPERATIVE CHOLANGIOGRAM;  Surgeon: Gaynelle Adu, MD;  Location: [facility];  Service: General;  Laterality: N/A;   ESOPHAGOGASTRODUODENOSCOPY (EGD) WITH PROPOFOL Left 09/20/2014   Procedure: ESOPHAGOGASTRODUODENOSCOPY (EGD) WITH PROPOFOL;  Surgeon: Willis Modena, MD;   Location: [facility];  Service: Endoscopy;  Laterality: Left;   ESOPHAGOGASTRODUODENOSCOPY (EGD) WITH PROPOFOL N/A 03/31/2019   Procedure: ESOPHAGOGASTRODUODENOSCOPY (EGD) WITH PROPOFOL;  Surgeon: Bernette Redbird, MD;  Location: [facility];  Service: Endoscopy;  Laterality: N/A;   WISDOM TOOTH EXTRACTION      Prior to Admission medications   Medication Sig Start Date End Date Taking? Authorizing Provider  acetaminophen (TYLENOL) 325 MG tablet Take 2 tablets (650 mg total) by mouth every 6 (six) hours as needed for mild pain (pain score 1-3) (or Fever >/= 101). 12/17/22  Yes Alwyn Ren, MD  albuterol (PROVENTIL HFA;VENTOLIN HFA) 108 (90 Base) MCG/ACT inhaler Inhale 1-2 puffs into the lungs every 6 (six) hours as needed for wheezing or shortness of breath.   Yes [provider]  insulin aspart (NOVOLOG) 100 UNIT/ML FlexPen Inject 4 Units into the skin 3 (three) times daily with meals. Patient taking differently: Inject 4 Units into the skin 3 (three) times daily as needed for high blood sugar. 04/17/19  Yes Hall, Carole N, DO  insulin aspart (NOVOLOG) 100 UNIT/ML injection 50 Units daily. VIA PUMP 12/15/22  Yes [provider]  Insulin Pen Needle 31G X 5 MM MISC 30 Units by Does not apply route at bedtime. 12/07/17  Yes Rodolph Bong, MD  lisinopril (ZESTRIL) 2.5 MG tablet Take 2.5 mg by mouth daily.   Yes [provider]  metoCLOPramide (REGLAN) 10 MG tablet Take 1 tablet (10 mg total) by mouth every 8 (eight) hours as needed for nausea. 12/17/22 01/16/23 Yes Alwyn Ren, MD  pantoprazole (  PROTONIX) 40 MG tablet Take 1 tablet (40 mg total) by mouth daily for 14 days. 12/12/22 12/26/22 Yes Osvaldo Shipper, MD  promethazine (PHENERGAN) 25 MG tablet Take 1 tablet (25 mg total) by mouth every 6 (six) hours as needed for nausea or vomiting. 12/17/22  Yes Alwyn Ren, MD  blood glucose meter kit and supplies Dispense based on patient and  insurance preference. Use up to four times daily as directed. (FOR ICD-10 E10.9, E11.9). 12/07/17   Rodolph Bong, MD    Current Facility-Administered Medications  Medication Dose Route Frequency Provider Last Rate Last Admin   0.9 % NaCl with KCl 20 mEq/ L  infusion   Intravenous Continuous Bobette Mo, MD 82 mL/hr at 12/21/22 0640 Infusion Verify at 12/21/22 0640   acetaminophen (TYLENOL) tablet 650 mg  650 mg Oral Q6H PRN Bobette Mo, MD       Or   acetaminophen (TYLENOL) suppository 650 mg  650 mg Rectal Q6H PRN Bobette Mo, MD       enoxaparin (LOVENOX) injection 40 mg  40 mg Subcutaneous Q24H Bobette Mo, MD   40 mg at 12/20/22 2126   feeding supplement (BOOST / RESOURCE BREEZE) liquid 1 Container  1 Container Oral TID BM Bobette Mo, MD   1 Container at 12/21/22 [phone]   insulin pump   Subcutaneous TID WC, HS, 0200 Luiz Iron, NP   1 each at 12/20/22 2127   metoCLOPramide (REGLAN) injection 10 mg  10 mg Intravenous Q6H Bobette Mo, MD   10 mg at 12/21/22 1215   morphine (PF) 2 MG/ML injection 2 mg  2 mg Intravenous Q4H PRN Alwyn Ren, MD       ondansetron [facility]) tablet 4 mg  4 mg Oral Q6H PRN Bobette Mo, MD       Or   ondansetron [facility]) injection 4 mg  4 mg Intravenous Q6H PRN Bobette Mo, MD   4 mg at 12/20/22 2126   Oral care mouth rinse  15 mL Mouth Rinse PRN Bobette Mo, MD       pantoprazole (PROTONIX) injection 40 mg  40 mg Intravenous Q12H Bobette Mo, MD   40 mg at 12/21/22 0941   prochlorperazine (COMPAZINE) injection 10 mg  10 mg Intravenous Q6H PRN Bobette Mo, MD   10 mg at 12/20/22 1448   sodium chloride flush (NS) 0.9 % injection 10-40 mL  10-40 mL Intracatheter PRN Palumbo, April, MD        Allergies as of 12/19/2022 - Review Complete 12/17/2022  Allergen Reaction Noted   Peanut-containing drug products Swelling and Other (See Comments) 12/04/2011   Food  Swelling and Other (See Comments) 05/13/2016   Ultram [tramadol] Itching 06/23/2015    Family History  Problem Relation Age of Onset   Heart disease Maternal Grandmother    Heart disease Maternal Grandfather    Diabetes Mother    Hyperlipidemia Mother    Hypertension Father    Heart disease Father    Hypertension Paternal Grandmother    Cancer Paternal Grandfather     Social History   Socioeconomic History   Marital status: Single    Spouse name: Not on file   Number of children: 1   Years of education: 12   Highest education level: Not on file  Occupational History   Occupation: Unemployed.  Tobacco Use   Smoking status: Never   Smokeless tobacco: Never  Vaping Use  Vaping status: Never Used  Substance and Sexual Activity   Alcohol use: No   Drug use: Not Currently    Types: Marijuana   Sexual activity: Never  Other Topics Concern   Not on file  Social History Narrative   Lives with her mother.  Unemployed.  Trying to qualify for disability.   Social Determinants of Health   Financial Resource Strain: Low Risk  (05/25/2022)   Received from [facility]   Overall Financial Resource Strain (CARDIA)    Difficulty of Paying Living Expenses: Not hard at all  Food Insecurity: No Food Insecurity (12/20/2022)   Hunger Vital Sign    Worried About Running Out of Food in the Last Year: Never true    Ran Out of Food in the Last Year: Never true  Transportation Needs: No Transportation Needs (12/20/2022)   PRAPARE - Administrator, Civil Service (Medical): No    Lack of Transportation (Non-Medical): No  Physical Activity: Inactive (01/15/2021)   Received from [facility], [facility]   Exercise Vital Sign    Days of Exercise per Week: 0 days    Minutes of Exercise per Session: 0 min  Stress: Unknown (01/15/2021)   Received from [facility], [facility] - Occupational Stress Questionnaire    Feeling of  Stress : Patient declined  Social Connections: Unknown (05/23/2021)   Received from [facility], [facility]   Social Network    Social Network: Not on file  Intimate Partner Violence: Not At Risk (12/20/2022)   Humiliation, Afraid, Rape, and Kick questionnaire    Fear of Current or Ex-Partner: No    Emotionally Abused: No    Physically Abused: No    Sexually Abused: No    Review of Systems: As per HPI, all others negative  Physical Exam: Vital signs in last 24 hours: Temp:  [97.8 F (36.6 C)-99.1 F (37.3 C)] 98.2 F (36.8 C) (12/02 0635) Pulse Rate:  [93-109] 93 (12/02 0635) Resp:  [16-18] 16 (12/02 0635) BP: (92-141)/(66-81) 117/77 (12/02 0635) SpO2:  [98 %-100 %] 100 % (12/02 0635) Weight:  [56 kg] 56 kg (12/01 1308)   General:   Alert,  Well-developed, well-nourished, pleasant and cooperative in NAD Head:  Normocephalic and atraumatic. Eyes:  Sclera clear, no icterus.   Conjunctiva pink. Ears:  Normal auditory acuity. Nose:  No deformity, discharge,  or lesions. Mouth:  No deformity or lesions.  Oropharynx pink & moist. Neck:  Supple; no masses or thyromegaly. Lungs:  No obvious respiratory distress Abdomen:  Soft, nontender and nondistended. No masses, hepatosplenomegaly or hernias noted. Normal bowel sounds, without guarding, and without rebound.     Msk:  Symmetrical without gross deformities. Normal posture. Pulses:  Normal pulses noted. Extremities:  Without clubbing or edema. Neurologic:  Alert and  oriented x4;  grossly normal neurologically. Skin:  Intact without significant lesions or rashes. Psych:  Alert and cooperative. Normal mood and affect.   Lab Results: Recent Labs    12/20/22 0141 12/21/22 0500  WBC 6.7 11.6*  HGB 12.3 10.4*  HCT 37.7 30.7*  PLT 163 310   BMET Recent Labs    12/20/22 0141 12/21/22 0500  NA 139 134*  K 2.9* 4.1  CL 106 110  CO2 23 21*  GLUCOSE 177* 72  BUN 12 14  CREATININE 0.70 0.76  CALCIUM 8.7* 8.0*    LFT Recent Labs    12/21/22 0500  PROT 5.5*  ALBUMIN 3.2*  AST 21  ALT 17  ALKPHOS 62  BILITOT 0.6   PT/INR No results for input(s): "LABPROT", "INR" in the last 72 hours.  Studies/Results: No results found.  Impression:   Nausea and vomiting, recurrent, likely gastroparesis (diabetes).  Has THC on urine drug screen; she denies using herself but states she may have had passive exposure. Esophageal wall thickening on CT scan, likely esophagitis from vomiting (previously seen on prior endoscopy).   Plan:   Supportive care.  Will change ondansetron IV to scheduled. Metoclopramide scheduled for now, mindful to try to wean down/off in near future given extrapyramidal side effect profile. PPI IV BID. Full liquid diet (acidity of clear liquids not likely going to be well tolerated). IV fluids, replete potassium. If ongoing symptoms after a few days, consider endoscopy. [facility] will follow.   LOS: 0 days   Ayeden Gladman M  12/21/2022, 12:47 PM  Cell [phone] If no answer or after 5 PM call [phone]

## 2022-12-21 NOTE — Inpatient Diabetes Management (Signed)
Inpatient Diabetes Program Recommendations  AACE/ADA: New Consensus Statement on Inpatient Glycemic Control (2015)  Target Ranges:  Prepandial:   less than 140 mg/dL      Peak postprandial:   less than 180 mg/dL (1-2 hours)      Critically ill patients:  140 - 180 mg/dL   Lab Results  Component Value Date   GLUCAP 83 12/21/2022   HGBA1C 7.4 (H) 12/09/2022    Review of Glycemic Control  Latest Reference Range & Units 12/20/22 20:35 12/21/22 01:55 12/21/22 07:38  Glucose-Capillary 70 - 99 mg/dL 244 (H) 69 (L) 83   Diabetes history: DM1 Outpatient Diabetes medications: Tandum T-slim Insulin pump with Dexcom CGM Insulin pump settings: Basal 0.95 I:C - 9 ISF 40  Boost tid between meals  Current orders for Inpatient glycemic control:  Insulin pump order set  Glucose trends wnl. Pt familiar to our team.   Thanks, Christena Deem RN, MSN, BC-ADM Inpatient Diabetes Coordinator Team Pager [phone] (8a-5p)

## 2022-12-21 NOTE — Progress Notes (Signed)
PROGRESS NOTE    [name]  WGN:[id number] DOB: 21-Feb-1990 DOA: 12/19/2022 PCP: Jaia Alonge Palau, FNP   Brief Narrative:  32 y.o. female with medical history significant of poorly controlled IDDM, diabetic gastroparesis, multiple episodes of intractable cyclical vomiting, GERD, hepatic steatosis, history of liver mass, history of acute pancreatitis, history of pneumonia hypertension, asthma, osteoarthritis, heart murmur, hypertension, anxiety, vitamin D deficiency who was recently admitted and discharged twice in the past 2 weeks due to nausea and vomiting in the setting of's observation of gastroparesis. No hematemesis, melena or hematochezia. She denied fever, chills, rhinorrhea, sore throat, wheezing or hemoptysis. No chest pain, palpitations, diaphoresis, PND, orthopnea or pitting edema of the lower extremities. No flank pain, dysuria, frequency or hematuria.  No polyuria, polydipsia, polyphagia or blurred vision.    Lab work: Her urine analysis was hazy with greater than 500 glucose and ketones of 80 mg/dL.  UDS was positive for opiates, benzodiazepines and THC.  CBC was normal.  Serum pregnancy test was negative.  Lipase was normal.  CMP showed a potassium of 2.9 mmol/L, glucose of 177 and calcium of 8.7 mg/dL.  The rest of the CMP measurements were normal.  Phosphorus level was 1.3 mg/dL.   Imaging: CT abdomen/pelvis done on 12/15/2022 6 shows circumferential mural thickening of the distal esophagus, which may be seen in the setting of esophagitis.  Uterine 3.3 x 2.7 rounded mass, which is presumed to be a leiomyoma.   ED course: Initial vital signs were temperature 98 F, pulse 114 respirations 22, BP 135/75 mmHg O2 sat 100% on room air.  The patient received droperidol 1.25 mg IVP, promethazine 25 mg IVPB, magnesium sulfate 2 g IVPB, morphine 4 mg IVP, prochlorperazine 10 mg IVP, KCl 100 mEq IVPB x 3 and 1000 mL of normal saline bolus.  I added pantoprazole 40 mg IVP.   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  She reports she eats small portion diet at home with nausea medications however she continues to have nausea vomiting and diarrhea this admission. Continue Reglan standing order, Zofran standing order, full liquid diet recommended by GI. Continue PPI twice daily Urine drug screen positive   Active Problems:   Asthma continue home bronchodilator     Hypophosphatemia low at 1.3 replete and recheck in a.m.     Hypocalcemia/vitamin D deficiency severe Replete    Hypokalemia resolved     Hepatic steatosis history LFTs are normal. Hepatic imaging unremarkable.     GERD (gastroesophageal reflux disease) On parenteral PPI.     HTN (hypertension) Blood pressure is soft. Hold lisinopril for now.  Estimated body mass index is 21.87 kg/m as calculated from the following:   Height as of this encounter: 5\' 3"  (1.6 m).   Weight as of this encounter: 56 kg.  DVT prophylaxis: lovenox Code Status: full Family Communication: none Disposition Plan:  Status is: Inpatient Remains inpatient appropriate because: nv   Consultants:  gi  Procedures:none Antimicrobials:none  Subjective: Complains of nausea persistent Appreciate GI input  Objective: Vitals:   12/20/22 1730 12/20/22 2144 12/21/22  0159 12/21/22 0635  BP: 106/68 92/66 130/77 117/77  Pulse: (!) 109 93 99 93  Resp: 18 16 17 16   Temp: 98.5 F (36.9 C) 98.3 F (36.8 C) 97.8 F (36.6 C) 98.2 F (36.8 C)  TempSrc: Oral Oral Oral Oral  SpO2: 98% 98% 100% 100%  Weight:      Height:         Intake/Output Summary (Last 24 hours) at 12/21/2022 1239 Last data filed at 12/21/2022 0640 Gross per 24 hour  Intake 2464.91 ml  Output 600 ml  Net 1864.91 ml   Filed Weights   12/20/22 1308  Weight: 56 kg    Examination:  General exam: Appears in nad  Respiratory system: Clear to auscultation. Respiratory effort normal. Cardiovascular system: S1 & S2 heard, RRR. No JVD, murmurs, rubs, gallops or clicks. No pedal edema. Gastrointestinal system: Abdomen is nondistended, soft and nontender. No organomegaly or masses felt. Normal bowel sounds heard. Central nervous system: Alert and oriented. Extremities: no edema  Data Reviewed: I have personally reviewed following labs and imaging studies  CBC: Recent Labs  Lab 12/15/22 0900 12/15/22 1542 12/16/22 0701 12/20/22 0141 12/21/22 0500  WBC 10.5 18.2* 12.4* 6.7 11.6*  NEUTROABS 9.3*  --   --  5.0  --   HGB 11.2* 12.9 11.4* 12.3 10.4*  HCT 32.7* 39.0 34.5* 37.7 30.7*  MCV 90.8 92.9 91.5 91.7 91.1  PLT 257 313 263 163 310   Basic Metabolic Panel: Recent Labs  Lab 12/15/22 0630 12/15/22 1542 12/16/22 0701 12/20/22 0141 12/21/22 0500  NA 138  --  137 139 134*  K 3.4*  --  3.1* 2.9* 4.1  CL 102  --  105 106 110  CO2 23  --  23 23 21*  GLUCOSE 140*  --  90 177* 72  BUN 17  --  19 12 14   CREATININE 0.75 0.82 0.72 0.70 0.76  CALCIUM 10.1  --  8.8* 8.7* 8.0*  PHOS  --   --   --  1.3*  --    GFR: Estimated Creatinine Clearance: 82.7 mL/min (by C-G formula based on SCr of 0.76 mg/dL). Liver Function Tests: Recent Labs  Lab 12/15/22 0630 12/20/22 0141 12/21/22 0500  AST 28 24 21   ALT 20 19 17   ALKPHOS 94 74 62  BILITOT 0.9 0.5 0.6  PROT 9.0* 7.0 5.5*  ALBUMIN 5.0 4.1 3.2*   Recent Labs  Lab 12/15/22 0630 12/20/22 0141  LIPASE 23 18   No results for input(s): "AMMONIA" in the last 168 hours. Coagulation Profile: No results for input(s): "INR", "PROTIME" in the last 168 hours. Cardiac Enzymes: No  results for input(s): "CKTOTAL", "CKMB", "CKMBINDEX", "TROPONINI" in the last 168 hours. BNP (last 3 results) No results for input(s): "PROBNP" in the last 8760 hours. HbA1C: No results for input(s): "HGBA1C" in the last 72 hours. CBG: Recent Labs  Lab 12/17/22 0743 12/20/22 2035 12/21/22 0155 12/21/22 0738 12/21/22 1142  GLUCAP 127* 320* 69* 83 142*   Lipid Profile: No results for input(s): "CHOL", "HDL", "LDLCALC", "TRIG", "CHOLHDL", "LDLDIRECT" in the last 72 hours. Thyroid Function Tests: No results for input(s): "TSH", "T4TOTAL", "FREET4", "T3FREE", "THYROIDAB" in the last 72 hours. Anemia Panel: No results for input(s): "VITAMINB12", "FOLATE", "FERRITIN", "TIBC", "IRON", "RETICCTPCT" in the last 72 hours. Sepsis Labs: No results for input(s): "PROCALCITON", "LATICACIDVEN" in the last 168 hours.  No results found for this or any previous visit (from the past 240 hour(s)).  Radiology Studies: No results found.      Scheduled Meds:  enoxaparin (LOVENOX) injection  40 mg Subcutaneous Q24H   feeding supplement  1 Container Oral TID BM   insulin pump   Subcutaneous TID WC, HS, 0200   metoCLOPramide (REGLAN) injection  10 mg Intravenous Q6H   pantoprazole (PROTONIX) IV  40 mg Intravenous Q12H   Continuous Infusions:  0.9 % NaCl with KCl 20 mEq / L 82 mL/hr at 12/21/22 0640     LOS: 0 days    Time spent: 37 min   Alwyn Ren, MD 12/21/2022, 12:39 PM

## 2022-12-22 DIAGNOSIS — R112 Nausea with vomiting, unspecified: Secondary | ICD-10-CM | POA: Diagnosis not present

## 2022-12-22 LAB — COMPREHENSIVE METABOLIC PANEL
ALT: 20 U/L (ref 0–44)
AST: 27 U/L (ref 15–41)
Albumin: 3.4 g/dL — ABNORMAL LOW (ref 3.5–5.0)
Alkaline Phosphatase: 62 U/L (ref 38–126)
Anion gap: 7 (ref 5–15)
BUN: 7 mg/dL (ref 6–20)
CO2: 20 mmol/L — ABNORMAL LOW (ref 22–32)
Calcium: 8.1 mg/dL — ABNORMAL LOW (ref 8.9–10.3)
Chloride: 109 mmol/L (ref 98–111)
Creatinine, Ser: 0.51 mg/dL (ref 0.44–1.00)
GFR, Estimated: >60 mL/min (ref >60)
Glucose, Bld: 126 mg/dL — ABNORMAL HIGH (ref 70–99)
Potassium: 3.6 mmol/L (ref 3.5–5.1)
Sodium: 136 mmol/L (ref 135–145)
Total Bilirubin: 0.8 mg/dL (ref <1.2)
Total Protein: 5.9 g/dL — ABNORMAL LOW (ref 6.5–8.1)

## 2022-12-22 LAB — CBC
HCT: 31 % — ABNORMAL LOW (ref 36.0–46.0)
Hemoglobin: 10.5 g/dL — ABNORMAL LOW (ref 12.0–15.0)
MCH: 30.8 pg (ref 26.0–34.0)
MCHC: 33.9 g/dL (ref 30.0–36.0)
MCV: 90.9 fL (ref 80.0–100.0)
Platelets: 271 10*3/uL (ref 150–400)
RBC: 3.41 MIL/uL — ABNORMAL LOW (ref 3.87–5.11)
RDW: 13.8 % (ref 11.5–15.5)
WBC: 6.2 10*3/uL (ref 4.0–10.5)
nRBC: 0 % (ref 0.0–0.2)

## 2022-12-22 LAB — PHOSPHORUS: Phosphorus: 3.1 mg/dL (ref 2.5–4.6)

## 2022-12-22 LAB — GLUCOSE, CAPILLARY
Glucose-Capillary: 113 mg/dL — ABNORMAL HIGH (ref 70–99)
Glucose-Capillary: 104 mg/dL — ABNORMAL HIGH (ref 70–99)
Glucose-Capillary: 148 mg/dL — ABNORMAL HIGH (ref 70–99)

## 2022-12-22 MED ORDER — OXYCODONE HCL 5 MG PO TABS
5.0000 mg | ORAL_TABLET | ORAL | Status: DC | PRN
  Administered 2022-12-22: 5 mg via ORAL
  Filled 2022-12-22: qty 1

## 2022-12-22 MED ORDER — ONDANSETRON HCL 4 MG PO TABS: 4.0000 mg | ORAL_TABLET | Freq: Three times a day (TID) | ORAL | 0 refills | Status: DC

## 2022-12-22 MED ORDER — PANTOPRAZOLE SODIUM 40 MG PO TBEC: 40.0000 mg | DELAYED_RELEASE_TABLET | Freq: Two times a day (BID) | ORAL | Status: DC

## 2022-12-22 MED ORDER — VITAMIN D (ERGOCALCIFEROL) 1.25 MG (50000 UNIT) PO CAPS: 50000.0000 [IU] | ORAL_CAPSULE | ORAL | 4 refills | Status: DC

## 2022-12-22 NOTE — Discharge Summary (Signed)
Physician Discharge Summary  [name] ZOX:[id number] DOB: Jan 30, 1990 DOA: 12/19/2022  PCP: Stefanie Bong Palau, FNP  Admit date: 12/19/2022 Discharge date: 12/22/2022  Admitted From: Home Disposition: Home  Recommendations for Outpatient Follow-up:  Follow up with PCP in 1-2 weeks Please obtain BMP/CBC in one week Please follow up [name]  Home Health: None Equipment/Devices: None   Discharge Condition:stable CODE STATUS:full Diet recommendation: cardiac  Brief/Interim Summary:32 y.o. female with medical history significant of poorly controlled IDDM, diabetic gastroparesis, multiple episodes of intractable cyclical vomiting, GERD, hepatic steatosis, history of liver mass, history of acute pancreatitis, history of pneumonia hypertension, asthma, osteoarthritis, heart murmur, hypertension, anxiety, vitamin D deficiency who was recently admitted and discharged twice in the past 2 weeks due to nausea and vomiting in the setting of's observation of gastroparesis. No hematemesis, melena or hematochezia. She denied fever, chills, rhinorrhea, sore throat, wheezing or hemoptysis. No chest pain, palpitations, diaphoresis, PND, orthopnea or pitting edema of the lower extremities. No flank pain, dysuria, frequency or hematuria.  No polyuria, polydipsia, polyphagia or blurred vision.    Lab work: Her urine analysis was hazy with greater than 500 glucose and ketones of 80 mg/dL.  UDS was positive for opiates, benzodiazepines and THC.  CBC was normal.  Serum pregnancy test was negative.  Lipase was normal.  CMP showed a potassium of 2.9 mmol/L, glucose of 177 and calcium of 8.7 mg/dL.  The rest of the CMP measurements were normal.  Phosphorus level was 1.3 mg/dL.   Imaging: CT abdomen/pelvis done on 12/15/2022 6 shows circumferential mural thickening of the distal esophagus, which may be seen in the setting of esophagitis.  Uterine 3.3 x 2.7 rounded mass, which is presumed to be a leiomyoma.   ED  course: Initial vital signs were temperature 98 F, pulse 114 respirations 22, BP 135/75 mmHg O2 sat 100% on room air.  The patient received droperidol 1.25 mg IVP, promethazine 25 mg IVPB, magnesium sulfate 2 g IVPB, morphine 4 mg IVP, prochlorperazine 10 mg IVP, KCl 100 mEq IVPB x 3 and 1000 mL of normal saline bolus.  I added pantoprazole 40 mg IVP.     Discharge Diagnoses:  Principal Problem:   Intractable nausea and vomiting Active Problems:   Asthma   Hypophosphatemia   Hypokalemia   Hepatic steatosis   GERD (gastroesophageal reflux disease)   HTN (hypertension)   Gastroparesis due to secondary diabetes (HCC)   Hypocalcemia  Intractable nausea and vomiting   Gastroparesis vs cannabis hyperemesis -she had 3 admissions in the last 2 weeks for the same.  She reports she eats small portion diet at home with nausea medications however she continues to have nausea vomiting and diarrhea this admission.  She was treated with Reglan and Zofran for liquid diet and then soft diet.  She was able to tolerate prior to discharge. Continue PPI twice daily Urine drug screen positive THC Follow up with dr outlaw who saw the patient in hospital.     Asthma continue home bronchodilator     Hypophosphatemia RESOLVED     Hypocalcemia/vitamin D deficiency severe discharged on D2     Hypokalemia resolved     Hepatic steatosis history LFTs are normal. Hepatic imaging unremarkable.     GERD (gastroesophageal reflux disease) On parenteral PPI.     HTN (hypertension) continue ace  Estimated body mass index is 21.87 kg/m as calculated from the following:   Height as of this encounter: 5\' 3"  (1.6 m).   Weight as of this  encounter: 56 kg.  Discharge Instructions  Discharge Instructions     Diet - low sodium heart healthy   Complete by: As directed    Increase activity slowly   Complete by: As directed       Allergies as of 12/22/2022       Reactions   Peanut-containing Drug Products  Swelling, Other (See Comments)   Reaction:  Swelling of mouth and lips    Food Swelling, Other (See Comments)   Pt is allergic to strawberries.   Reaction:  Swelling of mouth and lips    Ultram [tramadol] Itching        Medication List     TAKE these medications    acetaminophen 325 MG tablet Commonly known as: TYLENOL Take 2 tablets (650 mg total) by mouth every 6 (six) hours as needed for mild pain (pain score 1-3) (or Fever >/= 101).   albuterol 108 (90 Base) MCG/ACT inhaler Commonly known as: VENTOLIN HFA Inhale 1-2 puffs into the lungs every 6 (six) hours as needed for wheezing or shortness of breath.   blood glucose meter kit and supplies Dispense based on patient and insurance preference. Use up to four times daily as directed. (FOR ICD-10 E10.9, E11.9).   insulin aspart 100 UNIT/ML FlexPen Commonly known as: NOVOLOG Inject 4 Units into the skin 3 (three) times daily with meals. What changed:  when to take this reasons to take this   insulin aspart 100 UNIT/ML injection Commonly known as: novoLOG 50 Units daily. VIA PUMP What changed: Another medication with the same name was changed. Make sure you understand how and when to take each.   Insulin Pen Needle 31G X 5 MM Misc 30 Units by Does not apply route at bedtime.   lisinopril 2.5 MG tablet Commonly known as: ZESTRIL Take 2.5 mg by mouth daily.   metoCLOPramide 10 MG tablet Commonly known as: REGLAN Take 1 tablet (10 mg total) by mouth every 8 (eight) hours as needed for nausea.   ondansetron 4 MG tablet Commonly known as: ZOFRAN Take 1 tablet (4 mg total) by mouth every 8 (eight) hours.   pantoprazole 40 MG tablet Commonly known as: PROTONIX Take 1 tablet (40 mg total) by mouth daily for 14 days.   promethazine 25 MG tablet Commonly known as: PHENERGAN Take 1 tablet (25 mg total) by mouth every 6 (six) hours as needed for nausea or vomiting.   Vitamin D (Ergocalciferol) 1.25 MG (50000 UNIT) Caps  capsule Commonly known as: DRISDOL Take 1 capsule (50,000 Units total) by mouth every 7 (seven) days. Start taking on: December 28, 2022        Follow-up Information     Stefanie Aries Palau, FNP Follow up.   Specialty: Nurse Practitioner Contact information: [address] [place] [address] [phone]         Willis Modena, MD Follow up.   Specialty: Gastroenterology Contact information: [address] [place] [address] [phone]                Allergies  Allergen Reactions   Peanut-Containing Drug Products Swelling and Other (See Comments)    Reaction:  Swelling of mouth and lips    Food Swelling and Other (See Comments)    Pt is allergic to strawberries.   Reaction:  Swelling of mouth and lips    Ultram [Tramadol] Itching    Consultations: Gi dr outlaw  Procedures/Studies: CT ABDOMEN PELVIS W CONTRAST  Result  Date: 12/15/2022 CLINICAL DATA:  History of gastroparesis with abdominal pain, nausea and vomiting EXAM: CT ABDOMEN AND PELVIS WITH CONTRAST TECHNIQUE: Multidetector CT imaging of the abdomen and pelvis was performed using the standard protocol following bolus administration of intravenous contrast. RADIATION DOSE REDUCTION: This exam was performed according to the departmental dose-optimization program which includes automated exposure control, adjustment of the mA and/or kV according to patient size and/or use of iterative reconstruction technique. CONTRAST:  OMNIPAQUE IOHEXOL 300 MG/ML  SOLN COMPARISON:  CT abdomen and pelvis dated 02/18/2019 FINDINGS: Decreased sensitivity and specificity for detailed findings due to motion artifact. Lower chest: No focal consolidation or pulmonary nodule in the lung bases. No pleural effusion or pneumothorax demonstrated. Partially imaged heart size is normal. Hepatobiliary: No focal hepatic lesions. No intra or extrahepatic biliary ductal dilation. Cholecystectomy.  Pancreas: Atrophic pancreas. Spleen: Normal in size without focal abnormality. Adrenals/Urinary Tract: No adrenal nodules. Right renal cortical scarring. No suspicious renal mass, calculi or hydronephrosis. No focal bladder wall thickening. Stomach/Bowel: Circumferential mural thickening of the distal esophagus. Normal appearance of the stomach. No evidence of bowel wall thickening, distention, or inflammatory changes. Normal appendix. Vascular/Lymphatic: No significant vascular findings are present. No enlarged abdominal or pelvic lymph nodes. Reproductive: No adnexal masses. Rounded enhancing mass along the posterior uterine body measures 3.3 x 2.7 cm, likely leiomyoma, previously 1.7 x 1.5 cm. Other: No free fluid, fluid collection, or free air. Musculoskeletal: No acute or abnormal lytic or blastic osseous lesions. IMPRESSION: 1. Circumferential mural thickening of the distal esophagus, which may be seen in the setting of esophagitis. 2. Increased size of rounded enhancing mass along the posterior uterine body measuring 3.3 x 2.7 cm, likely leiomyoma. Electronically Signed   By: Agustin Cree M.D.   On: 12/15/2022 09:02   (Echo, Carotid, EGD, Colonoscopy, ERCP)    Subjective: Tolerated a diet  Discharge Exam: Vitals:   12/22/22 0557 12/22/22 1324  BP: 112/69 137/88  Pulse: 85 100  Resp: 14 16  Temp: 98.7 F (37.1 C) 98.8 F (37.1 C)  SpO2: 99% 100%   Vitals:   12/21/22 1416 12/21/22 2131 12/22/22 0557 12/22/22 1324  BP: 120/88 122/83 112/69 137/88  Pulse: 93 90 85 100  Resp: 18 14 14 16   Temp: 99.2 F (37.3 C) 99.2 F (37.3 C) 98.7 F (37.1 C) 98.8 F (37.1 C)  TempSrc: Oral Oral Oral Oral  SpO2: 100% 100% 99% 100%  Weight:      Height:        General: Pt is alert, awake, not in acute distress Cardiovascular: RRR, S1/S2 +, no rubs, no gallops Respiratory: CTA bilaterally, no wheezing, no rhonchi Abdominal: Soft, NT, ND, bowel sounds + Extremities: no edema, no cyanosis    The  results of significant diagnostics from this hospitalization (including imaging, microbiology, ancillary and laboratory) are listed below for reference.     Microbiology: No results found for this or any previous visit (from the past 240 hour(s)).   Labs: BNP (last 3 results) No results for input(s): "BNP" in the last 8760 hours. Basic Metabolic Panel: Recent Labs  Lab 12/16/22 0701 12/20/22 0141 12/21/22 0500 12/22/22 0825  NA 137 139 134* 136  K 3.1* 2.9* 4.1 3.6  CL 105 106 110 109  CO2 23 23 21* 20*  GLUCOSE 90 177* 72 126*  BUN 19 12 14 7   CREATININE 0.72 0.70 0.76 0.51  CALCIUM 8.8* 8.7* 8.0* 8.1*  PHOS  --  1.3*  --  3.1   Liver Function Tests: Recent Labs  Lab 12/20/22 0141 12/21/22 0500 12/22/22 0825  AST 24 21 27   ALT 19 17 20   ALKPHOS 74 62 62  BILITOT 0.5 0.6 0.8  PROT 7.0 5.5* 5.9*  ALBUMIN 4.1 3.2* 3.4*   Recent Labs  Lab 12/20/22 0141  LIPASE 18   No results for input(s): "AMMONIA" in the last 168 hours. CBC: Recent Labs  Lab 12/16/22 0701 12/20/22 0141 12/21/22 0500 12/22/22 0704  WBC 12.4* 6.7 11.6* 6.2  NEUTROABS  --  5.0  --   --   HGB 11.4* 12.3 10.4* 10.5*  HCT 34.5* 37.7 30.7* 31.0*  MCV 91.5 91.7 91.1 90.9  PLT 263 163 310 271   Cardiac Enzymes: No results for input(s): "CKTOTAL", "CKMB", "CKMBINDEX", "TROPONINI" in the last 168 hours. BNP: Invalid input(s): "POCBNP" CBG: Recent Labs  Lab 12/21/22 1636 12/21/22 2132 12/22/22 0157 12/22/22 0719 12/22/22 1126  GLUCAP 144* 157* 113* 104* 148*   D-Dimer No results for input(s): "DDIMER" in the last 72 hours. Hgb A1c No results for input(s): "HGBA1C" in the last 72 hours. Lipid Profile No results for input(s): "CHOL", "HDL", "LDLCALC", "TRIG", "CHOLHDL", "LDLDIRECT" in the last 72 hours. Thyroid function studies No results for input(s): "TSH", "T4TOTAL", "T3FREE", "THYROIDAB" in the last 72 hours.  Invalid input(s): "FREET3" Anemia work up No results for input(s):  "VITAMINB12", "FOLATE", "FERRITIN", "TIBC", "IRON", "RETICCTPCT" in the last 72 hours. Urinalysis    Component Value Date/Time   COLORURINE YELLOW 12/20/2022 0731   APPEARANCEUR HAZY (A) 12/20/2022 0731   LABSPEC 1.015 12/20/2022 0731   PHURINE 7.0 12/20/2022 0731   GLUCOSEU >=500 (A) 12/20/2022 0731   GLUCOSEU >=1000 11/07/2012 1205   HGBUR NEGATIVE 12/20/2022 0731   BILIRUBINUR NEGATIVE 12/20/2022 0731   KETONESUR 80 (A) 12/20/2022 0731   PROTEINUR NEGATIVE 12/20/2022 0731   UROBILINOGEN 0.2 11/24/2014 1045   NITRITE NEGATIVE 12/20/2022 0731   LEUKOCYTESUR NEGATIVE 12/20/2022 0731   Sepsis Labs Recent Labs  Lab 12/16/22 0701 12/20/22 0141 12/21/22 0500 12/22/22 0704  WBC 12.4* 6.7 11.6* 6.2   Microbiology No results found for this or any previous visit (from the past 240 hour(s)).   Time coordinating discharge: 38 minutes  SIGNED: Alwyn Ren, MD  [facility] 12/22/2022, 4:35 PM

## 2022-12-22 NOTE — Progress Notes (Signed)
Subjective: Feels much better.  Objective: Vital signs in last 24 hours: Temp:  [98.7 F (37.1 C)-99.2 F (37.3 C)] 98.7 F (37.1 C) (12/03 0557) Pulse Rate:  [85-93] 85 (12/03 0557) Resp:  [14-18] 14 (12/03 0557) BP: (112-122)/(69-88) 112/69 (12/03 0557) SpO2:  [99 %-100 %] 99 % (12/03 0557) Weight change:  Last BM Date : 12/21/22  PE: GEN:  NAD NEURO:  No encephalopathy  Lab Results: CBC    Component Value Date/Time   WBC 6.2 12/22/2022 0704   RBC 3.41 (L) 12/22/2022 0704   HGB 10.5 (L) 12/22/2022 0704   HCT 31.0 (L) 12/22/2022 0704   PLT 271 12/22/2022 0704   MCV 90.9 12/22/2022 0704   MCH 30.8 12/22/2022 0704   MCHC 33.9 12/22/2022 0704   RDW 13.8 12/22/2022 0704   LYMPHSABS 1.3 12/20/2022 0141   MONOABS 0.2 12/20/2022 0141   EOSABS 0.1 12/20/2022 0141   BASOSABS 0.0 12/20/2022 0141  CMP     Component Value Date/Time   NA 136 12/22/2022 0825   K 3.6 12/22/2022 0825   CL 109 12/22/2022 0825   CO2 20 (L) 12/22/2022 0825   GLUCOSE 126 (H) 12/22/2022 0825   BUN 7 12/22/2022 0825   CREATININE 0.51 12/22/2022 0825   CALCIUM 8.1 (L) 12/22/2022 0825   PROT 5.9 (L) 12/22/2022 0825   ALBUMIN 3.4 (L) 12/22/2022 0825   AST 27 12/22/2022 0825   ALT 20 12/22/2022 0825   ALKPHOS 62 12/22/2022 0825   BILITOT 0.8 12/22/2022 0825   GFR 164.55 08/10/2013 0924   GFRNONAA >60 12/22/2022 0825   Assessment:   Nausea and vomiting, recurrent, likely gastroparesis (diabetes).  Has THC on urine drug screen; she denies using herself but states she may have had passive exposure.  Much improved over past 24 hours. Esophageal wall thickening on CT scan, likely esophagitis from vomiting (previously seen on prior endoscopy).  Plan:   Soft diet. Change to po PPI and antiemetics. If does better, consider discharge home later today or tomorrow morning. Patient can follow-up with me as outpatient. [facility] will sign-off; please call with questions; thank you for the  consultation. Case reviewed with hospitalist team.   Freddy Jaksch 12/22/2022, 10:59 AM   Cell [phone] If no answer or after 5 PM call [phone]

## 2022-12-22 NOTE — Plan of Care (Signed)

## 2023-03-08 ENCOUNTER — Encounter (HOSPITAL_COMMUNITY): Payer: Self-pay

## 2023-03-08 ENCOUNTER — Emergency Department (HOSPITAL_COMMUNITY): Payer: Medicaid Other

## 2023-03-08 ENCOUNTER — Inpatient Hospital Stay (HOSPITAL_COMMUNITY)
Admission: EM | Admit: 2023-03-08 | Discharge: 2023-03-14 | DRG: 074 | Disposition: A | Discharge status: Home / Self Care | Payer: Medicaid Other | Attending: Internal Medicine | Admitting: Internal Medicine

## 2023-03-08 ENCOUNTER — Other Ambulatory Visit: Payer: Self-pay

## 2023-03-08 DIAGNOSIS — R6510 Systemic inflammatory response syndrome (SIRS) of non-infectious origin without acute organ dysfunction: Secondary | ICD-10-CM | POA: Diagnosis present

## 2023-03-08 DIAGNOSIS — E1043 Type 1 diabetes mellitus with diabetic autonomic (poly)neuropathy: Principal | ICD-10-CM | POA: Diagnosis present

## 2023-03-08 DIAGNOSIS — I10 Essential (primary) hypertension: Secondary | ICD-10-CM | POA: Diagnosis present

## 2023-03-08 DIAGNOSIS — D259 Leiomyoma of uterus, unspecified: Secondary | ICD-10-CM | POA: Diagnosis present

## 2023-03-08 DIAGNOSIS — Z794 Long term (current) use of insulin: Secondary | ICD-10-CM

## 2023-03-08 DIAGNOSIS — Z809 Family history of malignant neoplasm, unspecified: Secondary | ICD-10-CM

## 2023-03-08 DIAGNOSIS — J45909 Unspecified asthma, uncomplicated: Secondary | ICD-10-CM | POA: Diagnosis present

## 2023-03-08 DIAGNOSIS — M19042 Primary osteoarthritis, left hand: Secondary | ICD-10-CM | POA: Diagnosis present

## 2023-03-08 DIAGNOSIS — Z8249 Family history of ischemic heart disease and other diseases of the circulatory system: Secondary | ICD-10-CM

## 2023-03-08 DIAGNOSIS — K529 Noninfective gastroenteritis and colitis, unspecified: Secondary | ICD-10-CM | POA: Diagnosis present

## 2023-03-08 DIAGNOSIS — K2100 Gastro-esophageal reflux disease with esophagitis, without bleeding: Secondary | ICD-10-CM | POA: Diagnosis present

## 2023-03-08 DIAGNOSIS — Z1152 Encounter for screening for COVID-19: Secondary | ICD-10-CM

## 2023-03-08 DIAGNOSIS — Z5320 Procedure and treatment not carried out because of patient's decision for unspecified reasons: Secondary | ICD-10-CM | POA: Diagnosis present

## 2023-03-08 DIAGNOSIS — Z79899 Other long term (current) drug therapy: Secondary | ICD-10-CM

## 2023-03-08 DIAGNOSIS — Z833 Family history of diabetes mellitus: Secondary | ICD-10-CM

## 2023-03-08 DIAGNOSIS — M170 Bilateral primary osteoarthritis of knee: Secondary | ICD-10-CM | POA: Diagnosis present

## 2023-03-08 DIAGNOSIS — E872 Acidosis, unspecified: Secondary | ICD-10-CM | POA: Diagnosis present

## 2023-03-08 DIAGNOSIS — F39 Unspecified mood [affective] disorder: Secondary | ICD-10-CM | POA: Diagnosis present

## 2023-03-08 DIAGNOSIS — M19072 Primary osteoarthritis, left ankle and foot: Secondary | ICD-10-CM | POA: Diagnosis present

## 2023-03-08 DIAGNOSIS — Z885 Allergy status to narcotic agent status: Secondary | ICD-10-CM

## 2023-03-08 DIAGNOSIS — R112 Nausea with vomiting, unspecified: Secondary | ICD-10-CM | POA: Diagnosis not present

## 2023-03-08 DIAGNOSIS — E1065 Type 1 diabetes mellitus with hyperglycemia: Secondary | ICD-10-CM | POA: Diagnosis present

## 2023-03-08 DIAGNOSIS — K760 Fatty (change of) liver, not elsewhere classified: Secondary | ICD-10-CM | POA: Diagnosis present

## 2023-03-08 DIAGNOSIS — Z888 Allergy status to other drugs, medicaments and biological substances status: Secondary | ICD-10-CM

## 2023-03-08 DIAGNOSIS — K3184 Gastroparesis: Secondary | ICD-10-CM | POA: Diagnosis not present

## 2023-03-08 DIAGNOSIS — Z91010 Allergy to peanuts: Secondary | ICD-10-CM

## 2023-03-08 DIAGNOSIS — E861 Hypovolemia: Secondary | ICD-10-CM | POA: Diagnosis not present

## 2023-03-08 DIAGNOSIS — E876 Hypokalemia: Secondary | ICD-10-CM | POA: Diagnosis present

## 2023-03-08 DIAGNOSIS — Z9049 Acquired absence of other specified parts of digestive tract: Secondary | ICD-10-CM

## 2023-03-08 DIAGNOSIS — M19071 Primary osteoarthritis, right ankle and foot: Secondary | ICD-10-CM | POA: Diagnosis present

## 2023-03-08 DIAGNOSIS — Z8349 Family history of other endocrine, nutritional and metabolic diseases: Secondary | ICD-10-CM

## 2023-03-08 DIAGNOSIS — E860 Dehydration: Secondary | ICD-10-CM | POA: Diagnosis present

## 2023-03-08 DIAGNOSIS — M19041 Primary osteoarthritis, right hand: Secondary | ICD-10-CM | POA: Diagnosis present

## 2023-03-08 DIAGNOSIS — Z9641 Presence of insulin pump (external) (internal): Secondary | ICD-10-CM | POA: Diagnosis present

## 2023-03-08 LAB — COMPREHENSIVE METABOLIC PANEL
ALT: 34 U/L (ref 0–44)
AST: 56 U/L — ABNORMAL HIGH (ref 15–41)
Albumin: 4.1 g/dL (ref 3.5–5.0)
Alkaline Phosphatase: 95 U/L (ref 38–126)
Anion gap: 11 (ref 5–15)
BUN: 18 mg/dL (ref 6–20)
CO2: 18 mmol/L — ABNORMAL LOW (ref 22–32)
Calcium: 8.9 mg/dL (ref 8.9–10.3)
Chloride: 109 mmol/L (ref 98–111)
Creatinine, Ser: 0.8 mg/dL (ref 0.44–1.00)
GFR, Estimated: >60 mL/min (ref >60)
Glucose, Bld: 236 mg/dL — ABNORMAL HIGH (ref 70–99)
Potassium: 4.3 mmol/L (ref 3.5–5.1)
Sodium: 138 mmol/L (ref 135–145)
Total Bilirubin: 1.1 mg/dL (ref 0.0–1.2)
Total Protein: 7.7 g/dL (ref 6.5–8.1)

## 2023-03-08 LAB — CBC
HCT: 39.6 % (ref 36.0–46.0)
Hemoglobin: 13.4 g/dL (ref 12.0–15.0)
MCH: 30 pg (ref 26.0–34.0)
MCHC: 33.8 g/dL (ref 30.0–36.0)
MCV: 88.8 fL (ref 80.0–100.0)
Platelets: 263 10*3/uL (ref 150–400)
RBC: 4.46 MIL/uL (ref 3.87–5.11)
RDW: 12.5 % (ref 11.5–15.5)
WBC: 12.5 10*3/uL — ABNORMAL HIGH (ref 4.0–10.5)
nRBC: 0 % (ref 0.0–0.2)

## 2023-03-08 LAB — HCG, SERUM, QUALITATIVE: Preg, Serum: NEGATIVE

## 2023-03-08 LAB — RESP PANEL BY RT-PCR (RSV, FLU A&B, COVID)  RVPGX2
Influenza A by PCR: NEGATIVE
Influenza B by PCR: NEGATIVE
Resp Syncytial Virus by PCR: NEGATIVE
SARS Coronavirus 2 by RT PCR: NEGATIVE

## 2023-03-08 LAB — LIPASE, BLOOD: Lipase: 19 U/L (ref 11–51)

## 2023-03-08 MED ORDER — LACTATED RINGERS IV BOLUS
1000.0000 mL | Freq: Once | INTRAVENOUS | Status: AC
  Administered 2023-03-08: 1000 mL via INTRAVENOUS

## 2023-03-08 MED ORDER — PROMETHAZINE (PHENERGAN) 6.25MG IN NS 50ML IVPB
6.2500 mg | Freq: Once | INTRAVENOUS | Status: AC
  Administered 2023-03-08: 6.25 mg via INTRAVENOUS
  Filled 2023-03-08: qty 6.25

## 2023-03-08 MED ORDER — ALUM & MAG HYDROXIDE-SIMETH 200-200-20 MG/5ML PO SUSP
30.0000 mL | Freq: Once | ORAL | Status: AC
Start: 2023-03-08 — End: 2023-03-09
  Administered 2023-03-09: 30 mL via ORAL
  Filled 2023-03-08: qty 30

## 2023-03-08 MED ORDER — IOHEXOL 300 MG/ML  SOLN
100.0000 mL | Freq: Once | INTRAMUSCULAR | Status: AC | PRN
  Administered 2023-03-09: 80 mL via INTRAVENOUS

## 2023-03-08 MED ORDER — INSULIN ASPART 100 UNIT/ML IJ SOLN
0.0000 [IU] | Freq: Every day | INTRAMUSCULAR | Status: DC
  Administered 2023-03-09: 3 [IU] via SUBCUTANEOUS
  Administered 2023-03-11: 2 [IU] via SUBCUTANEOUS
  Filled 2023-03-08: qty 0.05

## 2023-03-08 MED ORDER — PROMETHAZINE HCL 25 MG RE SUPP
25.0000 mg | Freq: Four times a day (QID) | RECTAL | Status: DC | PRN
  Administered 2023-03-09 (×2): 25 mg via RECTAL
  Filled 2023-03-08 (×4): qty 1

## 2023-03-08 MED ORDER — INSULIN ASPART 100 UNIT/ML IJ SOLN
0.0000 [IU] | Freq: Three times a day (TID) | INTRAMUSCULAR | Status: DC
  Administered 2023-03-09: 3 [IU] via SUBCUTANEOUS
  Administered 2023-03-09: 7 [IU] via SUBCUTANEOUS
  Administered 2023-03-09 – 2023-03-10 (×2): 5 [IU] via SUBCUTANEOUS
  Administered 2023-03-10: 3 [IU] via SUBCUTANEOUS
  Administered 2023-03-10 – 2023-03-12 (×5): 2 [IU] via SUBCUTANEOUS
  Administered 2023-03-12: 1 [IU] via SUBCUTANEOUS
  Administered 2023-03-12 – 2023-03-13 (×2): 2 [IU] via SUBCUTANEOUS
  Administered 2023-03-13 – 2023-03-14 (×3): 3 [IU] via SUBCUTANEOUS
  Administered 2023-03-14: 8 [IU] via SUBCUTANEOUS
  Filled 2023-03-08: qty 0.09

## 2023-03-08 MED ORDER — FENTANYL CITRATE PF 50 MCG/ML IJ SOSY
25.0000 ug | PREFILLED_SYRINGE | Freq: Once | INTRAMUSCULAR | Status: AC
  Administered 2023-03-08: 25 ug via INTRAVENOUS
  Filled 2023-03-08: qty 1

## 2023-03-08 MED ORDER — PROMETHAZINE HCL 25 MG RE SUPP
25.0000 mg | Freq: Once | RECTAL | Status: AC
  Administered 2023-03-09: 25 mg via RECTAL
  Filled 2023-03-08: qty 1

## 2023-03-08 MED ORDER — LORAZEPAM 2 MG/ML IJ SOLN
0.5000 mg | Freq: Four times a day (QID) | INTRAMUSCULAR | Status: DC | PRN
  Administered 2023-03-09 (×3): 0.5 mg via INTRAVENOUS
  Filled 2023-03-08 (×3): qty 1

## 2023-03-08 MED ORDER — KETOROLAC TROMETHAMINE 30 MG/ML IJ SOLN
30.0000 mg | Freq: Once | INTRAMUSCULAR | Status: AC
  Administered 2023-03-08: 30 mg via INTRAVENOUS
  Filled 2023-03-08: qty 1

## 2023-03-08 MED ORDER — INSULIN GLARGINE-YFGN 100 UNIT/ML ~~LOC~~ SOLN
10.0000 [IU] | Freq: Every day | SUBCUTANEOUS | Status: DC
  Administered 2023-03-09: 10 [IU] via SUBCUTANEOUS
  Filled 2023-03-08: qty 0.1

## 2023-03-08 MED ORDER — ALBUTEROL SULFATE (2.5 MG/3ML) 0.083% IN NEBU: 2.5000 mg | INHALATION_SOLUTION | RESPIRATORY_TRACT | Status: DC | PRN

## 2023-03-08 MED ORDER — PANTOPRAZOLE SODIUM 40 MG IV SOLR
40.0000 mg | Freq: Two times a day (BID) | INTRAVENOUS | Status: DC
  Administered 2023-03-09 – 2023-03-14 (×12): 40 mg via INTRAVENOUS
  Filled 2023-03-08 (×12): qty 10

## 2023-03-08 MED ORDER — METOCLOPRAMIDE HCL 5 MG/ML IJ SOLN
10.0000 mg | Freq: Once | INTRAMUSCULAR | Status: AC
  Administered 2023-03-08: 10 mg via INTRAVENOUS
  Filled 2023-03-08: qty 2

## 2023-03-08 MED ORDER — LIDOCAINE VISCOUS HCL 2 % MT SOLN: 15.0000 mL | Freq: Three times a day (TID) | OROMUCOSAL | Status: DC | PRN

## 2023-03-08 MED ORDER — LACTATED RINGERS IV BOLUS: 500.0000 mL | Freq: Once | INTRAVENOUS | Status: DC

## 2023-03-08 MED ORDER — LACTATED RINGERS IV BOLUS
1000.0000 mL | Freq: Once | INTRAVENOUS | Status: AC
  Administered 2023-03-09: 1000 mL via INTRAVENOUS

## 2023-03-08 MED ORDER — FENTANYL CITRATE PF 50 MCG/ML IJ SOSY
50.0000 ug | PREFILLED_SYRINGE | Freq: Once | INTRAMUSCULAR | Status: AC
  Administered 2023-03-08: 50 ug via INTRAVENOUS
  Filled 2023-03-08: qty 1

## 2023-03-08 MED ORDER — ONDANSETRON HCL 4 MG/2ML IJ SOLN
4.0000 mg | Freq: Four times a day (QID) | INTRAMUSCULAR | Status: DC | PRN
  Administered 2023-03-09 – 2023-03-13 (×8): 4 mg via INTRAVENOUS
  Filled 2023-03-08 (×8): qty 2

## 2023-03-08 MED ORDER — MORPHINE SULFATE (PF) 2 MG/ML IV SOLN
2.0000 mg | Freq: Once | INTRAVENOUS | Status: AC
  Administered 2023-03-09: 2 mg via INTRAVENOUS
  Filled 2023-03-08: qty 1

## 2023-03-08 MED ORDER — ONDANSETRON HCL 4 MG/2ML IJ SOLN: 4.0000 mg | Freq: Once | INTRAMUSCULAR | Status: DC

## 2023-03-08 MED ORDER — LACTATED RINGERS IV SOLN: INTRAVENOUS | Status: AC

## 2023-03-08 MED ORDER — METOCLOPRAMIDE HCL 5 MG/ML IJ SOLN
10.0000 mg | Freq: Three times a day (TID) | INTRAMUSCULAR | Status: DC
  Administered 2023-03-09 – 2023-03-13 (×14): 10 mg via INTRAVENOUS
  Filled 2023-03-08 (×15): qty 2

## 2023-03-08 MED ORDER — ENOXAPARIN SODIUM 40 MG/0.4ML IJ SOSY
40.0000 mg | PREFILLED_SYRINGE | INTRAMUSCULAR | Status: DC
Start: 2023-03-09 — End: 2023-03-14
  Administered 2023-03-09 – 2023-03-14 (×6): 40 mg via SUBCUTANEOUS
  Filled 2023-03-08 (×6): qty 0.4

## 2023-03-08 MED ORDER — SUCRALFATE 1 GM/10ML PO SUSP
1.0000 g | Freq: Three times a day (TID) | ORAL | Status: DC
  Administered 2023-03-09 – 2023-03-14 (×21): 1 g via ORAL
  Filled 2023-03-08 (×22): qty 10

## 2023-03-08 NOTE — ED Notes (Signed)
Pt refusing to go to CT due to n/v

## 2023-03-08 NOTE — ED Provider Notes (Addendum)
EMERGENCY DEPARTMENT AT [facility] Provider Note   CSN: [id number] Arrival date & time: 03/08/23  1418     History  Chief Complaint  Patient presents with  . Emesis    [name] is a 33 y.o. female with history of diabetes, diabetic gastroparesis, multiple episodes of intractable cyclical vomiting, GERD, pancreatitis, presents with concern for nonbloody emesis, nonbloody diarrhea, and nausea that started this morning.  States this feels similar to previous episodes of gastroparesis.  She tried taking her home Phenergan and states she threw this back up.  She has not been able to keep any food or liquids down at home.  Reports some bodyaches and cough that started a couple days ago.  Also reports ear pain that she was started on amoxicillin for.  Denies any recent travel.   Emesis      Home Medications Prior to Admission medications   Medication Sig Start Date End Date Taking? Authorizing Provider  acetaminophen (TYLENOL) 325 MG tablet Take 2 tablets (650 mg total) by mouth every 6 (six) hours as needed for mild pain (pain score 1-3) (or Fever >/= 101). 12/17/22   Alwyn Ren, MD  albuterol (PROVENTIL HFA;VENTOLIN HFA) 108 (90 Base) MCG/ACT inhaler Inhale 1-2 puffs into the lungs every 6 (six) hours as needed for wheezing or shortness of breath.    [provider]  blood glucose meter kit and supplies Dispense based on patient and insurance preference. Use up to four times daily as directed. (FOR ICD-10 E10.9, E11.9). 12/07/17   Rodolph Bong, MD  insulin aspart (NOVOLOG) 100 UNIT/ML FlexPen Inject 4 Units into the skin 3 (three) times daily with meals. Patient taking differently: Inject 4 Units into the skin 3 (three) times daily as needed for high blood sugar. 04/17/19   Darlin Drop, DO  insulin aspart (NOVOLOG) 100 UNIT/ML injection 50 Units daily. VIA PUMP 12/15/22   [provider]  Insulin Pen Needle 31G X 5 MM MISC  30 Units by Does not apply route at bedtime. 12/07/17   Rodolph Bong, MD  lisinopril (ZESTRIL) 2.5 MG tablet Take 2.5 mg by mouth daily.    [provider]  metoCLOPramide (REGLAN) 10 MG tablet Take 1 tablet (10 mg total) by mouth every 8 (eight) hours as needed for nausea. 12/17/22 01/16/23  Alwyn Ren, MD  ondansetron (ZOFRAN) 4 MG tablet Take 1 tablet (4 mg total) by mouth every 8 (eight) hours. 12/22/22   Alwyn Ren, MD  pantoprazole (PROTONIX) 40 MG tablet Take 1 tablet (40 mg total) by mouth daily for 14 days. 12/12/22 12/26/22  Osvaldo Shipper, MD  promethazine (PHENERGAN) 25 MG tablet Take 1 tablet (25 mg total) by mouth every 6 (six) hours as needed for nausea or vomiting. 12/17/22   Alwyn Ren, MD  Vitamin D, Ergocalciferol, (DRISDOL) 1.25 MG (50000 UNIT) CAPS capsule Take 1 capsule (50,000 Units total) by mouth every 7 (seven) days. 12/28/22   Alwyn Ren, MD      Allergies    Peanut-containing drug products, Food, and Ultram [tramadol]    Review of Systems   Review of Systems  Gastrointestinal:  Positive for vomiting.    Physical Exam Updated Vital Signs BP (!) 134/96   Pulse (!) 110   Temp 97.6 F (36.4 C)   Resp 18   Ht 5\' 3"  (1.6 m)   Wt 56 kg   SpO2 100%   BMI 21.87 kg/m  Physical  Exam Vitals and nursing note reviewed.  Constitutional:      General: She is not in acute distress.    Appearance: She is well-developed.     Comments: Patient rolling around all over her bed when myself or nursing walk into the room, but then sits still when nobody in the room  Patient does have a couple episodes of emesis  HENT:     Head: Normocephalic and atraumatic.  Eyes:     Conjunctiva/sclera: Conjunctivae normal.  Cardiovascular:     Rate and Rhythm: Regular rhythm. Tachycardia present.     Heart sounds: No murmur heard. Pulmonary:     Effort: Pulmonary effort is normal. No respiratory distress.     Breath sounds: Normal  breath sounds.  Abdominal:     Palpations: Abdomen is soft.     Tenderness: There is no abdominal tenderness.     Comments: Abdomen soft and nontender to palpation  Musculoskeletal:        General: No swelling.     Cervical back: Neck supple.  Skin:    General: Skin is warm and dry.     Capillary Refill: Capillary refill takes less than 2 seconds.  Neurological:     Mental Status: She is alert.  Psychiatric:        Mood and Affect: Mood normal.     ED Results / Procedures / Treatments   Labs (all labs ordered are listed, but only abnormal results are displayed) Labs Reviewed  COMPREHENSIVE METABOLIC PANEL - Abnormal; Notable for the following components:      Result Value   CO2 18 (*)    Glucose, Bld 236 (*)    AST 56 (*)    All other components within normal limits  CBC - Abnormal; Notable for the following components:   WBC 12.5 (*)    All other components within normal limits  RESP PANEL BY RT-PCR (RSV, FLU A&B, COVID)  [id number]  LIPASE, BLOOD  HCG, SERUM, QUALITATIVE  URINALYSIS, ROUTINE W REFLEX MICROSCOPIC  RAPID URINE DRUG SCREEN, HOSP PERFORMED    EKG None  Radiology No results found.  Procedures Procedures    Medications Ordered in ED Medications  iohexol (OMNIPAQUE) 300 MG/ML solution 100 mL (has no administration in time range)  lactated ringers bolus 1,000 mL (1,000 mLs Intravenous New Bag/Given 03/08/23 1632)  metoCLOPramide (REGLAN) injection 10 mg (10 mg Intravenous Given 03/08/23 1632)  fentaNYL (SUBLIMAZE) injection 25 mcg (25 mcg Intravenous Given 03/08/23 1632)  fentaNYL (SUBLIMAZE) injection 50 mcg (50 mcg Intravenous Given 03/08/23 1837)  ketorolac (TORADOL) 30 MG/ML injection 30 mg (30 mg Intravenous Given 03/08/23 1837)  promethazine (PHENERGAN) 6.25 mg/NS 50 mL IVPB (6.25 mg Intravenous New Bag/Given 03/08/23 1919)    ED Course/ Medical Decision Making/ A&P Clinical Course as of 03/08/23 2052  Mon Mar 08, 2023  2049 I was notified by RN  that patient refused CT abdomen and pelvis. I also talked to patient and explained that given her reported uncontrolled abdominal pain I would like to further evaluate for this. She refused CT again.  [AF]    Clinical Course User Index [AF] Arabella Merles, PA-C                                 Medical Decision Making Amount and/or Complexity of Data Reviewed Labs: ordered. Radiology: ordered.  Risk Prescription drug management. Decision regarding hospitalization.     Differential diagnosis  includes but is not limited to Cholelithiasis, cholangitis, choledocholithiasis, peptic ulcer, gastritis, gastroenteritis, appendicitis, IBS, IBD, DKA, nephrolithiasis, UTI, pyelonephritis, pancreatitis, diverticulitis, mesenteric ischemia, abdominal aortic aneurysm, small bowel obstruction, volvulus, testicular torsion in males, ovarian torsion and pregnancy related concerns in females of childbearing age    ED Course:  Upon arrival, patient slightly tachycardic to 112, with active emesis.  Patient reports concern for gastroparesis.  IV was started and she was given metoclopramide for nausea, fentanyl for abdominal pain, and started on LR bolus for dehydration. I Ordered, and personally interpreted labs.  The pertinent results include:   CBC with leukocytosis of 12.5 CMP with elevated glucose of 236.  Mildly elevated AST at 56.  No other LFT elevations.  No elevation in creatinine or other electrolytes.  No anion gap Lipase within normal limits Pregnancy negative COVID, flu, RSV negative Patient does meet SIRS criteria with leukocytosis and tachycardia, but feel this is likely due to a gastroenteritis/ gastroparesis cause given she has been seen multiple times in the past for the same complaint and patient states this is similar and feels this is due to her gastroparesis. No fevers, chills, other sick symptoms, low concern for a bacterial cause that would benefit from antibiotics.  No abdominal  tenderness palpation, normal LFTs and creatinine, low concern for acute abdominal pathology at this time.  Will hold on antibiotics/sepsis protocol for now. Upon re-evaluation, patient stated the medications given before did not help with her symptoms.  She was given additional Toradol and fentanyl for pain, and Phenergan for nausea. Upon reevaluation, patient states medications have not helped and she request morphine.  Discussed that she has already gotten quite a bit of pain medication.  I discussed getting CT abdomen pelvis for further evaluation of her symptoms.  Patient refused CT. Patient does not feel comfortable going home, will consult hospitalist for admission  Impression: Nausea, vomiting, diarrhea Elevated glucose  Disposition:  Admission with Dr. Lazarus Salines  Imaging Studies ordered: I ordered imaging studies including CT abdomen pelvis, patient refused imaging   Cardiac Monitoring: / EKG: The patient was maintained on a cardiac monitor.  I personally viewed and interpreted the cardiac monitored which showed an underlying rhythm of: Sinus tachycardia   Consultations Obtained: I requested consultation with the hospitalist Dr. Lazarus Salines,  and discussed lab and imaging findings as well as pertinent plan - they recommend: admission for further management   External records from outside source obtained and reviewed including discharge summary from 01/18/2023 where patient was admitted for nausea and vomiting in the setting of gastroparesis              Final Clinical Impression(s) / ED Diagnoses Final diagnoses:  Nausea vomiting and diarrhea    Rx / DC Orders ED Discharge Orders     None         Arabella Merles, PA-C 03/08/23 2045    Arabella Merles, PA-C 03/08/23 2045    Arabella Merles, PA-C 03/08/23 2046    Loetta Rough, MD 03/08/23 [phone]

## 2023-03-08 NOTE — ED Triage Notes (Signed)
Pt has gastroparesis, started vomiting this AM. Took at home phenergan and vomited it up. Pt does not have suppositories at home. Pt is continuing to vomit in triage

## 2023-03-08 NOTE — ED Notes (Signed)
ED TO INPATIENT HANDOFF REPORT  ED Nurse Name and Phone #:   S Name/Age/Gender [name] 33 y.o. female Room/Bed: WA17/WA17  Code Status   Code Status: Full Code  Home/SNF/Other Home Patient oriented to: self, place, time, and situation Is this baseline? Yes   Triage Complete: Triage complete  Chief Complaint Gastroparesis [K31.84]  Triage Note Pt has gastroparesis, started vomiting this AM. Took at home phenergan and vomited it up. Pt does not have suppositories at home. Pt is continuing to vomit in triage   Allergies Allergies  Allergen Reactions   Peanut-Containing Drug Products Swelling and Other (See Comments)    Reaction:  Swelling of mouth and lips    Food Swelling and Other (See Comments)    Pt is allergic to strawberries.   Reaction:  Swelling of mouth and lips    Ultram [Tramadol] Itching    Level of Care/Admitting Diagnosis ED Disposition     ED Disposition  Admit   Condition  --   Comment  Hospital [facility]: [facility] [[id number]]  Level of Care: Med-Surg [16]  May place patient in observation at [facility] or [facility] if equivalent level of care is available:: No  Covid Evaluation: Asymptomatic - no recent exposure (last 10 days) testing not required  Diagnosis: Gastroparesis [536.3.ICD-9-CM]  Admitting Physician: Dolly Rias [[id number]]  Attending Physician: Dolly Rias [[id number]]          B Medical/Surgery History Past Medical History:  Diagnosis Date   Anxiety    Arthritis    "hands, feet, knees" (12/18/2016)   Asthma    Diabetic gastroparesis (HCC)    Per gastric emptying study 07/09/16 which showed significant delayed gastric emptying.   Gallstones    Gastroparesis    GERD (gastroesophageal reflux disease)    Heart murmur    Hepatic steatosis 11/26/2014   and hepatomegaly   Hypertension    hx (12/18/2016)   Intractable cyclical vomiting syndrome    /notes 12/18/2016   Liver mass 11/26/2014    Pancreatitis, acute 11/26/2014   Pneumonia    "as a teen X 1" (12/18/2016)   Type I diabetes mellitus (HCC) 2007   IDDM.  poorly controlled, multiple admits with DKA   Vitamin D deficiency 07/24/2016   Past Surgical History:  Procedure Laterality Date   CHOLECYSTECTOMY N/A 02/11/2015   Procedure: LAPAROSCOPIC CHOLECYSTECTOMY WITH INTRAOPERATIVE CHOLANGIOGRAM;  Surgeon: Gaynelle Adu, MD;  Location: [facility] ORS;  Service: General;  Laterality: N/A;   ESOPHAGOGASTRODUODENOSCOPY (EGD) WITH PROPOFOL Left 09/20/2014   Procedure: ESOPHAGOGASTRODUODENOSCOPY (EGD) WITH PROPOFOL;  Surgeon: Willis Modena, MD;  Location: [facility] ENDOSCOPY;  Service: Endoscopy;  Laterality: Left;   ESOPHAGOGASTRODUODENOSCOPY (EGD) WITH PROPOFOL N/A 03/31/2019   Procedure: ESOPHAGOGASTRODUODENOSCOPY (EGD) WITH PROPOFOL;  Surgeon: Bernette Redbird, MD;  Location: [facility] ENDOSCOPY;  Service: Endoscopy;  Laterality: N/A;   WISDOM TOOTH EXTRACTION       A IV Location/Drains/Wounds Patient Lines/Drains/Airways Status     Active Line/Drains/Airways     Name Placement date Placement time Site Days   Peripheral IV 03/08/23 22 G 2.5" Anterior;Left;Upper Arm 03/08/23  1609  Arm  less than 1            Intake/Output Last 24 hours No intake or output data in the 24 hours ending 03/08/23 2238  Labs/Imaging Results for orders placed or performed during the hospital encounter of 03/08/23 (from the past 48 hours)  Lipase, blood     Status: None   Collection Time: 03/08/23  3:34  PM  Result Value Ref Range   Lipase 19 11 - 51 U/L    Comment: Performed at [facility], [address], [place], [place] [address]  Comprehensive metabolic panel     Status: Abnormal   Collection Time: 03/08/23  3:34 PM  Result Value Ref Range   Sodium 138 135 - 145 mmol/L   Potassium 4.3 3.5 - 5.1 mmol/L    Comment: HEMOLYSIS AT THIS LEVEL MAY AFFECT RESULT   Chloride 109 98 - 111 mmol/L   CO2 18 (L) 22 - 32 mmol/L   Glucose, Bld 236  (H) 70 - 99 mg/dL    Comment: Glucose reference range applies only to samples taken after fasting for at least 8 hours.   BUN 18 6 - 20 mg/dL   Creatinine, Ser 4.40 0.44 - 1.00 mg/dL   Calcium 8.9 8.9 - 10.2 mg/dL   Total Protein 7.7 6.5 - 8.1 g/dL   Albumin 4.1 3.5 - 5.0 g/dL   AST 56 (H) 15 - 41 U/L    Comment: HEMOLYSIS AT THIS LEVEL MAY AFFECT RESULT   ALT 34 0 - 44 U/L    Comment: HEMOLYSIS AT THIS LEVEL MAY AFFECT RESULT   Alkaline Phosphatase 95 38 - 126 U/L   Total Bilirubin 1.1 0.0 - 1.2 mg/dL    Comment: HEMOLYSIS AT THIS LEVEL MAY AFFECT RESULT   GFR, Estimated >60 >60 mL/min    Comment: (NOTE) Calculated using the CKD-EPI Creatinine Equation (2021)    Anion gap 11 5 - 15    Comment: Performed at [facility], [address], [place], [place] [address]  CBC     Status: Abnormal   Collection Time: 03/08/23  3:34 PM  Result Value Ref Range   WBC 12.5 (H) 4.0 - 10.5 K/uL   RBC 4.46 3.87 - 5.11 MIL/uL   Hemoglobin 13.4 12.0 - 15.0 g/dL   HCT 64.4 03.4 - 74.2 %   MCV 88.8 80.0 - 100.0 fL   MCH 30.0 26.0 - 34.0 pg   MCHC 33.8 30.0 - 36.0 g/dL   RDW 59.5 63.8 - 75.6 %   Platelets 263 150 - 400 K/uL   nRBC 0.0 0.0 - 0.2 %    Comment: Performed at [facility], [address], [place], [place] [address]  hCG, serum, qualitative     Status: None   Collection Time: 03/08/23  3:34 PM  Result Value Ref Range   Preg, Serum NEGATIVE NEGATIVE    Comment:        THE SENSITIVITY OF THIS METHODOLOGY IS >10 mIU/mL. Performed at [facility], [address] [place], [place] [address]   Resp panel by RT-PCR (RSV, Flu A&B, Covid) Anterior Nasal Swab     Status: None   Collection Time: 03/08/23  6:40 PM   Specimen: Anterior Nasal Swab  Result Value Ref Range   SARS Coronavirus 2 by RT PCR NEGATIVE NEGATIVE    Comment: (NOTE) SARS-CoV-2 target nucleic acids are NOT DETECTED.  The SARS-CoV-2 RNA is generally detectable in  upper respiratory specimens during the acute phase of infection. The lowest concentration of SARS-CoV-2 viral copies this assay can detect is 138 copies/mL. A negative result does not preclude SARS-Cov-2 infection and should not be used as the sole basis for treatment or other patient management decisions. A negative result may occur with  improper specimen collection/handling, submission of specimen other than nasopharyngeal swab, presence of viral mutation(s)  within the areas targeted by this assay, and inadequate number of viral copies(<138 copies/mL). A negative result must be combined with clinical observations, patient history, and epidemiological information. The expected result is Negative.  Fact Sheet for Patients:  BloggerCourse.com  Fact Sheet for Healthcare Providers:  SeriousBroker.it  This test is no t yet approved or cleared by the [place] FDA and  has been authorized for detection and/or diagnosis of SARS-CoV-2 by FDA under an Emergency Use Authorization (EUA). This EUA will remain  in effect (meaning this test can be used) for the duration of the COVID-19 declaration under Section 564(b)(1) of the Act, 21 U.S.C.section 360bbb-3(b)(1), unless the authorization is terminated  or revoked sooner.       Influenza A by PCR NEGATIVE NEGATIVE   Influenza B by PCR NEGATIVE NEGATIVE    Comment: (NOTE) The Xpert Xpress SARS-CoV-2/FLU/RSV plus assay is intended as an aid in the diagnosis of influenza from Nasopharyngeal swab specimens and should not be used as a sole basis for treatment. Nasal washings and aspirates are unacceptable for Xpert Xpress SARS-CoV-2/FLU/RSV testing.  Fact Sheet for Patients: BloggerCourse.com  Fact Sheet for Healthcare Providers: SeriousBroker.it  This test is not yet approved or cleared by the [place] FDA and has been authorized  for detection and/or diagnosis of SARS-CoV-2 by FDA under an Emergency Use Authorization (EUA). This EUA will remain in effect (meaning this test can be used) for the duration of the COVID-19 declaration under Section 564(b)(1) of the Act, 21 U.S.C. section 360bbb-3(b)(1), unless the authorization is terminated or revoked.     Resp Syncytial Virus by PCR NEGATIVE NEGATIVE    Comment: (NOTE) Fact Sheet for Patients: BloggerCourse.com  Fact Sheet for Healthcare Providers: SeriousBroker.it  This test is not yet approved or cleared by the [place] FDA and has been authorized for detection and/or diagnosis of SARS-CoV-2 by FDA under an Emergency Use Authorization (EUA). This EUA will remain in effect (meaning this test can be used) for the duration of the COVID-19 declaration under Section 564(b)(1) of the Act, 21 U.S.C. section 360bbb-3(b)(1), unless the authorization is terminated or revoked.  Performed at [facility], [address], [place], [place] [address]    No results found.  Pending Labs Unresulted Labs (From admission, onward)     Start     Ordered   03/09/23 0500  Basic metabolic panel  Tomorrow morning,   R       Question:  Specimen collection method  Answer:  IV Team=IV Team collect   03/08/23 2212   03/09/23 0500  CBC  Tomorrow morning,   R       Question:  Specimen collection method  Answer:  IV Team=IV Team collect   03/08/23 2212   03/09/23 0500  Magnesium  Tomorrow morning,   R       Question:  Specimen collection method  Answer:  IV Team=IV Team collect   03/08/23 2212   03/09/23 0500  Phosphorus  Tomorrow morning,   R       Question:  Specimen collection method  Answer:  IV Team=IV Team collect   03/08/23 2212   03/08/23 2226  Opiate, quantitative, urine  Once,   R        03/08/23 2225   03/08/23 2212  Rapid urine drug screen (hospital performed)  ONCE - STAT,   STAT         03/08/23 2212   03/08/23 2208  Lactic acid, plasma  (  Lactic Acid)  ONCE - STAT,   STAT       Question:  Specimen collection method  Answer:  IV Team=IV Team collect   03/08/23 2212   03/08/23 1800  Rapid urine drug screen (hospital performed)  Add-on,   AD        03/08/23 1800   03/08/23 1428  Urinalysis, Routine w reflex microscopic -Urine, Clean Catch  Once,   URGENT       Question:  Specimen Source  Answer:  Urine, Clean Catch   03/08/23 1427            Vitals/Pain Today's Vitals   03/08/23 2013 03/08/23 2030 03/08/23 2045 03/08/23 2047  BP: (!) 134/96 (!) 157/86    Pulse: (!) 110 (!) 107    Resp: 18     Temp:    98.9 F (37.2 C)  TempSrc:    Oral  SpO2: 100%  100%   Weight:      Height:      PainSc:        Isolation Precautions No active isolations  Medications Medications  iohexol (OMNIPAQUE) 300 MG/ML solution 100 mL (has no administration in time range)  enoxaparin (LOVENOX) injection 40 mg (has no administration in time range)  metoCLOPramide (REGLAN) injection 10 mg (has no administration in time range)  ondansetron (ZOFRAN) injection 4 mg (has no administration in time range)  LORazepam (ATIVAN) injection 0.5 mg (has no administration in time range)  alum & mag hydroxide-simeth (MAALOX/MYLANTA) 200-200-20 MG/5ML suspension 30 mL (has no administration in time range)  morphine (PF) 2 MG/ML injection 2 mg (has no administration in time range)  lidocaine (XYLOCAINE) 2 % viscous mouth solution 15 mL (has no administration in time range)  pantoprazole (PROTONIX) injection 40 mg (has no administration in time range)  sucralfate (CARAFATE) 1 GM/10ML suspension 1 g (has no administration in time range)  lactated ringers bolus 500 mL (has no administration in time range)  lactated ringers infusion (has no administration in time range)  promethazine (PHENERGAN) suppository 25 mg (has no administration in time range)  insulin glargine-yfgn (SEMGLEE) injection 10 Units  (has no administration in time range)  insulin aspart (novoLOG) injection 0-9 Units (has no administration in time range)  insulin aspart (novoLOG) injection 0-5 Units (has no administration in time range)  lactated ringers bolus 1,000 mL (1,000 mLs Intravenous New Bag/Given 03/08/23 1632)  metoCLOPramide (REGLAN) injection 10 mg (10 mg Intravenous Given 03/08/23 1632)  fentaNYL (SUBLIMAZE) injection 25 mcg (25 mcg Intravenous Given 03/08/23 1632)  fentaNYL (SUBLIMAZE) injection 50 mcg (50 mcg Intravenous Given 03/08/23 1837)  ketorolac (TORADOL) 30 MG/ML injection 30 mg (30 mg Intravenous Given 03/08/23 1837)  promethazine (PHENERGAN) 6.25 mg/NS 50 mL IVPB (0 mg Intravenous Stopped 03/08/23 2126)    Mobility walks     Focused Assessments    R Recommendations: See Admitting Provider Note  Report given to:   Additional Notes:

## 2023-03-08 NOTE — H&P (Signed)
History and Physical    [name] YQM:[id number] DOB: 1990-03-04 DOA: 03/08/2023  PCP: Elizabeth Palau, FNP   Patient coming from: Home   Chief Complaint:  Chief Complaint  Patient presents with   Emesis    HPI: History limited due to distress [name] is a 33 y.o. 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 cyclical vomiting, HTN, hepatic steatosis, uterine fibroid, asthma, mood d/o, THC use who presents with acute onset of N/V this AM. States this is similar to gastroparesis, unclear what caused it to worsen. She is typically on insulin pump but this is currently off, she does not know her off-pump insulin dosing. Using sliding scale. Denies any hematemesis. Per ED had also had diarrhea. She is writhing on the bed and asking for morphine or dilaudid    Review of Systems:  ROS complete and negative except as marked above   Allergies  Allergen Reactions   Peanut-Containing Drug Products Swelling and Other (See Comments)    Reaction:  Swelling of mouth and lips    Food Swelling and Other (See Comments)    Pt is allergic to strawberries.   Reaction:  Swelling of mouth and lips    Ultram [Tramadol] Itching    Prior to Admission medications   Medication Sig Start Date End Date Taking? Authorizing Provider  acetaminophen (TYLENOL) 325 MG tablet Take 2 tablets (650 mg total) by mouth every 6 (six) hours as needed for mild pain (pain score 1-3) (or Fever >/= 101). 12/17/22   Alwyn Ren, MD  albuterol (PROVENTIL HFA;VENTOLIN HFA) 108 (90 Base) MCG/ACT inhaler Inhale 1-2 puffs into the lungs every 6 (six) hours as needed for wheezing or shortness of breath.    [provider]  blood glucose meter kit and supplies Dispense based on patient and insurance preference. Use up to four times daily as directed. (FOR ICD-10 E10.9, E11.9). 12/07/17   Rodolph Bong, MD  insulin aspart (NOVOLOG) 100 UNIT/ML FlexPen Inject 4 Units into the  skin 3 (three) times daily with meals. Patient taking differently: Inject 4 Units into the skin 3 (three) times daily as needed for high blood sugar. 04/17/19   Darlin Drop, DO  insulin aspart (NOVOLOG) 100 UNIT/ML injection 50 Units daily. VIA PUMP 12/15/22   [provider]  Insulin Pen Needle 31G X 5 MM MISC 30 Units by Does not apply route at bedtime. 12/07/17   Rodolph Bong, MD  lisinopril (ZESTRIL) 2.5 MG tablet Take 2.5 mg by mouth daily.    [provider]  metoCLOPramide (REGLAN) 10 MG tablet Take 1 tablet (10 mg total) by mouth every 8 (eight) hours as needed for nausea. 12/17/22 01/16/23  Alwyn Ren, MD  ondansetron (ZOFRAN) 4 MG tablet Take 1 tablet (4 mg total) by mouth every 8 (eight) hours. 12/22/22   Alwyn Ren, MD  pantoprazole (PROTONIX) 40 MG tablet Take 1 tablet (40 mg total) by mouth daily for 14 days. 12/12/22 12/26/22  Osvaldo Shipper, MD  promethazine (PHENERGAN) 25 MG tablet Take 1 tablet (25 mg total) by mouth every 6 (six) hours as needed for nausea or vomiting. 12/17/22   Alwyn Ren, MD  Vitamin D, Ergocalciferol, (DRISDOL) 1.25 MG (50000 UNIT) CAPS capsule Take 1 capsule (50,000 Units total) by mouth every 7 (seven) days. 12/28/22   Alwyn Ren, MD    Past Medical History:  Diagnosis Date   Anxiety    Arthritis    "  hands, feet, knees" (12/18/2016)   Asthma    Diabetic gastroparesis (HCC)    Per gastric emptying study 07/09/16 which showed significant delayed gastric emptying.   Gallstones    Gastroparesis    GERD (gastroesophageal reflux disease)    Heart murmur    Hepatic steatosis 11/26/2014   and hepatomegaly   Hypertension    hx (12/18/2016)   Intractable cyclical vomiting syndrome    /notes 12/18/2016   Liver mass 11/26/2014   Pancreatitis, acute 11/26/2014   Pneumonia    "as a teen X 1" (12/18/2016)   Type I diabetes mellitus (HCC) 2007   IDDM.  poorly controlled, multiple admits with  DKA   Vitamin D deficiency 07/24/2016    Past Surgical History:  Procedure Laterality Date   CHOLECYSTECTOMY N/A 02/11/2015   Procedure: LAPAROSCOPIC CHOLECYSTECTOMY WITH INTRAOPERATIVE CHOLANGIOGRAM;  Surgeon: Gaynelle Adu, MD;  Location: [facility] ORS;  Service: General;  Laterality: N/A;   ESOPHAGOGASTRODUODENOSCOPY (EGD) WITH PROPOFOL Left 09/20/2014   Procedure: ESOPHAGOGASTRODUODENOSCOPY (EGD) WITH PROPOFOL;  Surgeon: Willis Modena, MD;  Location: [facility] ENDOSCOPY;  Service: Endoscopy;  Laterality: Left;   ESOPHAGOGASTRODUODENOSCOPY (EGD) WITH PROPOFOL N/A 03/31/2019   Procedure: ESOPHAGOGASTRODUODENOSCOPY (EGD) WITH PROPOFOL;  Surgeon: Bernette Redbird, MD;  Location: [facility] ENDOSCOPY;  Service: Endoscopy;  Laterality: N/A;   WISDOM TOOTH EXTRACTION       reports that she has never smoked. She has never used smokeless tobacco. She reports that she does not currently use drugs after having used the following drugs: Marijuana. She reports that she does not drink alcohol.  Family History  Problem Relation Age of Onset   Heart disease Maternal Grandmother    Heart disease Maternal Grandfather    Diabetes Mother    Hyperlipidemia Mother    Hypertension Father    Heart disease Father    Hypertension Paternal Grandmother    Cancer Paternal Grandfather      Physical Exam: Vitals:   03/08/23 2013 03/08/23 2030 03/08/23 2045 03/08/23 2047  BP: (!) 134/96 (!) 157/86    Pulse: (!) 110 (!) 107    Resp: 18     Temp:    98.9 F (37.2 C)  TempSrc:    Oral  SpO2: 100%  100%   Weight:      Height:        Gen: Awake, alert, in distress from pain constantly writhing on bed, yelling; distress is out of proportion to abd exam.  CV: Regular, normal S1, S2, no murmurs  Resp: Normal WOB, CTAB  Abd: Flat, hypoactive, mild tenderness.; Actively wretching + large amount of clear / bilious emesis in emesis basin + on the floor.  MSK: Symmetric, no edema  Skin: No rashes or lesions to exposed skin  Neuro:  Alert and interactive  Psych: Writhing in distress    Data review:   Labs reviewed, notable for:   Bicarb 18, no anion gap Glucose 236 AST 56, other LFT normal.  Lipase within normal limit WBC 12 hCG negative   Micro:  Results for orders placed or performed during the hospital encounter of 03/08/23  Resp panel by RT-PCR (RSV, Flu A&B, Covid) Anterior Nasal Swab     Status: None   Collection Time: 03/08/23  6:40 PM   Specimen: Anterior Nasal Swab  Result Value Ref Range Status   SARS Coronavirus 2 by RT PCR NEGATIVE NEGATIVE Final    Comment: (NOTE) SARS-CoV-2 target nucleic acids are NOT DETECTED.  The SARS-CoV-2 RNA is generally detectable in upper respiratory  specimens during the acute phase of infection. The lowest concentration of SARS-CoV-2 viral copies this assay can detect is 138 copies/mL. A negative result does not preclude SARS-Cov-2 infection and should not be used as the sole basis for treatment or other patient management decisions. A negative result may occur with  improper specimen collection/handling, submission of specimen other than nasopharyngeal swab, presence of viral mutation(s) within the areas targeted by this assay, and inadequate number of viral copies(<138 copies/mL). A negative result must be combined with clinical observations, patient history, and epidemiological information. The expected result is Negative.  Fact Sheet for Patients:  BloggerCourse.com  Fact Sheet for Healthcare Providers:  SeriousBroker.it  This test is no t yet approved or cleared by the [place] FDA and  has been authorized for detection and/or diagnosis of SARS-CoV-2 by FDA under an Emergency Use Authorization (EUA). This EUA will remain  in effect (meaning this test can be used) for the duration of the COVID-19 declaration under Section 564(b)(1) of the Act, 21 U.S.C.section 360bbb-3(b)(1), unless the  authorization is terminated  or revoked sooner.       Influenza A by PCR NEGATIVE NEGATIVE Final   Influenza B by PCR NEGATIVE NEGATIVE Final    Comment: (NOTE) The Xpert Xpress SARS-CoV-2/FLU/RSV plus assay is intended as an aid in the diagnosis of influenza from Nasopharyngeal swab specimens and should not be used as a sole basis for treatment. Nasal washings and aspirates are unacceptable for Xpert Xpress SARS-CoV-2/FLU/RSV testing.  Fact Sheet for Patients: BloggerCourse.com  Fact Sheet for Healthcare Providers: SeriousBroker.it  This test is not yet approved or cleared by the [place] FDA and has been authorized for detection and/or diagnosis of SARS-CoV-2 by FDA under an Emergency Use Authorization (EUA). This EUA will remain in effect (meaning this test can be used) for the duration of the COVID-19 declaration under Section 564(b)(1) of the Act, 21 U.S.C. section 360bbb-3(b)(1), unless the authorization is terminated or revoked.     Resp Syncytial Virus by PCR NEGATIVE NEGATIVE Final    Comment: (NOTE) Fact Sheet for Patients: BloggerCourse.com  Fact Sheet for Healthcare Providers: SeriousBroker.it  This test is not yet approved or cleared by the [place] FDA and has been authorized for detection and/or diagnosis of SARS-CoV-2 by FDA under an Emergency Use Authorization (EUA). This EUA will remain in effect (meaning this test can be used) for the duration of the COVID-19 declaration under Section 564(b)(1) of the Act, 21 U.S.C. section 360bbb-3(b)(1), unless the authorization is terminated or revoked.  Performed at [facility], [address] [place], [place] [address]     Imaging reviewed:  No results found.   ED Course:  Treated with fentanyl, Toradol, Reglan, Zofran, Phenergan, 1 L IV fluid   Assessment/Plan:  33 y.o.  female with hx type 1 DM, C/B gastroparesis, esophagitis, ? cyclical vomiting, HTN, hepatic steatosis, uterine fibroid, asthma, mood d/o, THC use who presents with acute onset of N/V this AM.  Acute N/V/?D, abd pain  Hypovolemic ? Gastroparesis flare, ? Esophagitis, ? Cyclic vomiting Onset this AM, with copious clear / bilious emesis noted on evaluation. Tachycardic in the 110s, other vs wnl.  Exam with distress that is out of proportion to abd exam, writhing and yelling and asking for morphine / dilaudid. Imaging pending improved pain control + ability to lay flat. Etiology suspect gastroparesis v esophagitis v other acute gastroenteritis. Less likely but would consider withdrawal syndrome.  - Give morphine 2 mg IV  and take for CT abdomen pelvis - S/p 1 L IV fluid, given additional 1 L, then continue maintenance IV fluid at 75 cc an hour x 13 hours then reassess - Start pantoprazole 40 mg IV every 12 hours, sucralfate 1 g 4 times daily empirically for likely esophagitis - For gastroparesis schedule Reglan 10 mg IV every 8 hour -Additional symptomatic management Phenergan suppository 3 times daily as needed, Zofran as needed, Ativan second line for nausea vomiting,  - Overall would avoid opiate medications or use as sparingly as possible (pending result of CT for additional pathology) due to her hx of gastroparesis.  - Consider GI consult in the morning, not contacted overnight  Non-anion gap acidosis May be related to diarrhea.  -Check lactate  Question of withdrawal syndrome  -Send urine drug screen, send urine opiate quantitative  Chronic medical problems: Medication management: Pharm med history not completed at time of admission, med rec completed based on fill hx / discussion with patient. Attention to pharm rec once completed  Type 1 diabetes: Currently off insulin pump, (Basal is 0.95 = approx 22 U daily -> However per 11/27 note was on Semglee 10 with controlled sugars); Start at  [facility] 10 and esclate as needed, SSI for sensitive.  Do not hold basal if NPO.  Hypertension: No recent fill of antihypertensives Asthma: Albuterol as needed Mood disorder: Unclear if she is still taking bupropion/fluoxetine, follow-up pharmacy rec   Body mass index is 21.87 kg/m.    DVT prophylaxis:  Lovenox Code Status:  Full Code Diet:  Diet Orders (From admission, onward)     Start     Ordered   03/08/23 2208  Diet clear liquid Room service appropriate? Yes; Fluid consistency: Thin  Diet effective now       Question Answer Comment  Room service appropriate? Yes   Fluid consistency: Thin      03/08/23 2212           Family Communication:  No   Consults:  None   Admission status:   Observation, Med-Surg  Severity of Illness: The appropriate patient status for this patient is OBSERVATION. Observation status is judged to be reasonable and necessary in order to provide the required intensity of service to ensure the patient's safety. The patient's presenting symptoms, physical exam findings, and initial radiographic and laboratory data in the context of their medical condition is felt to place them at decreased risk for further clinical deterioration. Furthermore, it is anticipated that the patient will be medically stable for discharge from the hospital within 2 midnights of admission.    Dolly Rias, MD [facility]  How to contact the [facility] Attending or Consulting provider 7A - 7P or covering provider during after hours 7P -7A, for this patient.  Check the care team in [facility] and look for a) attending/consulting [facility] provider listed and b) the [facility] team listed Log into [email or web address] and use [place]'s universal password to access. If you do not have the password, please contact the hospital operator. Locate the [facility] provider you are looking for under [facility] and page to a number that you can be directly reached. If you still have difficulty reaching the  provider, please page the [facility] (Director on Call) for the Hospitalists listed on amion for assistance.  03/08/2023, 10:26 PM

## 2023-03-08 NOTE — ED Notes (Signed)
Pt refusing to go to CT due to pain, provider notified

## 2023-03-09 ENCOUNTER — Observation Stay (HOSPITAL_COMMUNITY): Payer: Medicaid Other

## 2023-03-09 DIAGNOSIS — M19072 Primary osteoarthritis, left ankle and foot: Secondary | ICD-10-CM | POA: Diagnosis present

## 2023-03-09 DIAGNOSIS — J45909 Unspecified asthma, uncomplicated: Secondary | ICD-10-CM | POA: Diagnosis present

## 2023-03-09 DIAGNOSIS — K3184 Gastroparesis: Secondary | ICD-10-CM | POA: Diagnosis present

## 2023-03-09 DIAGNOSIS — E861 Hypovolemia: Secondary | ICD-10-CM | POA: Diagnosis present

## 2023-03-09 DIAGNOSIS — M19041 Primary osteoarthritis, right hand: Secondary | ICD-10-CM | POA: Diagnosis present

## 2023-03-09 DIAGNOSIS — Z9049 Acquired absence of other specified parts of digestive tract: Secondary | ICD-10-CM | POA: Diagnosis not present

## 2023-03-09 DIAGNOSIS — E876 Hypokalemia: Secondary | ICD-10-CM | POA: Diagnosis present

## 2023-03-09 DIAGNOSIS — R6510 Systemic inflammatory response syndrome (SIRS) of non-infectious origin without acute organ dysfunction: Secondary | ICD-10-CM | POA: Diagnosis present

## 2023-03-09 DIAGNOSIS — M170 Bilateral primary osteoarthritis of knee: Secondary | ICD-10-CM | POA: Diagnosis present

## 2023-03-09 DIAGNOSIS — M19042 Primary osteoarthritis, left hand: Secondary | ICD-10-CM | POA: Diagnosis present

## 2023-03-09 DIAGNOSIS — E1065 Type 1 diabetes mellitus with hyperglycemia: Secondary | ICD-10-CM | POA: Diagnosis present

## 2023-03-09 DIAGNOSIS — E872 Acidosis, unspecified: Secondary | ICD-10-CM | POA: Diagnosis present

## 2023-03-09 DIAGNOSIS — E860 Dehydration: Secondary | ICD-10-CM | POA: Diagnosis present

## 2023-03-09 DIAGNOSIS — I10 Essential (primary) hypertension: Secondary | ICD-10-CM | POA: Diagnosis present

## 2023-03-09 DIAGNOSIS — E1043 Type 1 diabetes mellitus with diabetic autonomic (poly)neuropathy: Secondary | ICD-10-CM | POA: Diagnosis not present

## 2023-03-09 DIAGNOSIS — Z5320 Procedure and treatment not carried out because of patient's decision for unspecified reasons: Secondary | ICD-10-CM | POA: Diagnosis present

## 2023-03-09 DIAGNOSIS — K529 Noninfective gastroenteritis and colitis, unspecified: Secondary | ICD-10-CM | POA: Diagnosis present

## 2023-03-09 DIAGNOSIS — K2100 Gastro-esophageal reflux disease with esophagitis, without bleeding: Secondary | ICD-10-CM | POA: Diagnosis present

## 2023-03-09 DIAGNOSIS — Z1152 Encounter for screening for COVID-19: Secondary | ICD-10-CM | POA: Diagnosis not present

## 2023-03-09 DIAGNOSIS — K760 Fatty (change of) liver, not elsewhere classified: Secondary | ICD-10-CM | POA: Diagnosis present

## 2023-03-09 DIAGNOSIS — M19071 Primary osteoarthritis, right ankle and foot: Secondary | ICD-10-CM | POA: Diagnosis present

## 2023-03-09 DIAGNOSIS — Z809 Family history of malignant neoplasm, unspecified: Secondary | ICD-10-CM | POA: Diagnosis not present

## 2023-03-09 DIAGNOSIS — F39 Unspecified mood [affective] disorder: Secondary | ICD-10-CM | POA: Diagnosis present

## 2023-03-09 DIAGNOSIS — D259 Leiomyoma of uterus, unspecified: Secondary | ICD-10-CM | POA: Diagnosis present

## 2023-03-09 LAB — CBG MONITORING, ED: Glucose-Capillary: 521 mg/dL (ref 70–99)

## 2023-03-09 LAB — BASIC METABOLIC PANEL
Anion gap: 16 — ABNORMAL HIGH (ref 5–15)
BUN: 21 mg/dL — ABNORMAL HIGH (ref 6–20)
CO2: 18 mmol/L — ABNORMAL LOW (ref 22–32)
Calcium: 9.2 mg/dL (ref 8.9–10.3)
Chloride: 107 mmol/L (ref 98–111)
Creatinine, Ser: 1.01 mg/dL — ABNORMAL HIGH (ref 0.44–1.00)
GFR, Estimated: >60 mL/min (ref >60)
Glucose, Bld: 316 mg/dL — ABNORMAL HIGH (ref 70–99)
Potassium: 3.4 mmol/L — ABNORMAL LOW (ref 3.5–5.1)
Sodium: 141 mmol/L (ref 135–145)

## 2023-03-09 LAB — CBC
HCT: 39.7 % (ref 36.0–46.0)
Hemoglobin: 12.6 g/dL (ref 12.0–15.0)
MCH: 30.1 pg (ref 26.0–34.0)
MCHC: 31.7 g/dL (ref 30.0–36.0)
MCV: 95 fL (ref 80.0–100.0)
Platelets: 237 10*3/uL (ref 150–400)
RBC: 4.18 MIL/uL (ref 3.87–5.11)
RDW: 12.8 % (ref 11.5–15.5)
WBC: 15.3 10*3/uL — ABNORMAL HIGH (ref 4.0–10.5)
nRBC: 0 % (ref 0.0–0.2)

## 2023-03-09 LAB — MAGNESIUM: Magnesium: 1.9 mg/dL (ref 1.7–2.4)

## 2023-03-09 LAB — GLUCOSE, CAPILLARY
Glucose-Capillary: 431 mg/dL — ABNORMAL HIGH (ref 70–99)
Glucose-Capillary: 332 mg/dL — ABNORMAL HIGH (ref 70–99)
Glucose-Capillary: 288 mg/dL — ABNORMAL HIGH (ref 70–99)
Glucose-Capillary: 293 mg/dL — ABNORMAL HIGH (ref 70–99)
Glucose-Capillary: 347 mg/dL — ABNORMAL HIGH (ref 70–99)
Glucose-Capillary: 213 mg/dL — ABNORMAL HIGH (ref 70–99)
Glucose-Capillary: 276 mg/dL — ABNORMAL HIGH (ref 70–99)

## 2023-03-09 LAB — LACTIC ACID, PLASMA
Lactic Acid, Venous: 3.2 mmol/L (ref 0.5–1.9)
Lactic Acid, Venous: 2.4 mmol/L (ref 0.5–1.9)

## 2023-03-09 LAB — PHOSPHORUS: Phosphorus: 3.5 mg/dL (ref 2.5–4.6)

## 2023-03-09 MED ORDER — METHOCARBAMOL 500 MG PO TABS
1000.0000 mg | ORAL_TABLET | Freq: Four times a day (QID) | ORAL | Status: DC | PRN
  Administered 2023-03-09 – 2023-03-13 (×7): 1000 mg via ORAL
  Filled 2023-03-09 (×8): qty 2

## 2023-03-09 MED ORDER — HYDROXYZINE HCL 25 MG PO TABS
50.0000 mg | ORAL_TABLET | Freq: Every morning | ORAL | Status: DC
  Administered 2023-03-10 – 2023-03-14 (×5): 50 mg via ORAL
  Filled 2023-03-09 (×6): qty 2

## 2023-03-09 MED ORDER — MORPHINE SULFATE (PF) 2 MG/ML IV SOLN
2.0000 mg | Freq: Once | INTRAVENOUS | Status: AC
  Administered 2023-03-09: 2 mg via INTRAVENOUS
  Filled 2023-03-09: qty 1

## 2023-03-09 MED ORDER — INSULIN ASPART 100 UNIT/ML IJ SOLN
10.0000 [IU] | Freq: Once | INTRAMUSCULAR | Status: DC
  Filled 2023-03-09: qty 0.1

## 2023-03-09 MED ORDER — LACTATED RINGERS IV BOLUS
500.0000 mL | Freq: Once | INTRAVENOUS | Status: AC
  Administered 2023-03-09: 500 mL via INTRAVENOUS

## 2023-03-09 MED ORDER — ACETAMINOPHEN 10 MG/ML IV SOLN
1000.0000 mg | Freq: Four times a day (QID) | INTRAVENOUS | Status: AC | PRN
  Administered 2023-03-09: 1000 mg via INTRAVENOUS
  Filled 2023-03-09: qty 100

## 2023-03-09 MED ORDER — INSULIN GLARGINE-YFGN 100 UNIT/ML ~~LOC~~ SOLN
20.0000 [IU] | Freq: Every day | SUBCUTANEOUS | Status: DC
  Filled 2023-03-09: qty 0.2

## 2023-03-09 MED ORDER — INSULIN GLARGINE-YFGN 100 UNIT/ML ~~LOC~~ SOLN
20.0000 [IU] | Freq: Every day | SUBCUTANEOUS | Status: DC
  Administered 2023-03-09 – 2023-03-13 (×5): 20 [IU] via SUBCUTANEOUS
  Filled 2023-03-09 (×6): qty 0.2

## 2023-03-09 MED ORDER — INSULIN ASPART 100 UNIT/ML IJ SOLN
10.0000 [IU] | Freq: Once | INTRAMUSCULAR | Status: AC
  Administered 2023-03-09: 10 [IU] via SUBCUTANEOUS
  Filled 2023-03-09: qty 0.1

## 2023-03-09 MED ORDER — FLUOXETINE HCL 20 MG PO CAPS
20.0000 mg | ORAL_CAPSULE | Freq: Every morning | ORAL | Status: DC
  Administered 2023-03-10 – 2023-03-14 (×5): 20 mg via ORAL
  Filled 2023-03-09 (×6): qty 1

## 2023-03-09 MED ORDER — INSULIN GLARGINE-YFGN 100 UNIT/ML ~~LOC~~ SOLN
10.0000 [IU] | Freq: Once | SUBCUTANEOUS | Status: AC
  Administered 2023-03-09: 10 [IU] via SUBCUTANEOUS
  Filled 2023-03-09: qty 0.1

## 2023-03-09 MED ORDER — SODIUM CHLORIDE 0.9 % IV BOLUS
500.0000 mL | Freq: Once | INTRAVENOUS | Status: AC
Start: 2023-03-09 — End: 2023-03-09
  Administered 2023-03-09: 500 mL via INTRAVENOUS

## 2023-03-09 MED ORDER — DICYCLOMINE HCL 20 MG PO TABS
20.0000 mg | ORAL_TABLET | Freq: Three times a day (TID) | ORAL | Status: DC | PRN
  Administered 2023-03-09 – 2023-03-12 (×3): 20 mg via ORAL
  Filled 2023-03-09 (×5): qty 1

## 2023-03-09 NOTE — Progress Notes (Signed)
Offered patient due medications and patient refused

## 2023-03-09 NOTE — Progress Notes (Signed)
Provider notified of BG 431. Patient is asymptomatic of findings. Will repeat blood sugar at 0330 according to provider instructions.

## 2023-03-09 NOTE — Plan of Care (Signed)

## 2023-03-09 NOTE — Progress Notes (Signed)
Patient rocking back and forth asking for nausea pain. Requesting for nausea pain such as phenergan.  Administered ativan. Refusing to take po medications.  Will attempt later.

## 2023-03-09 NOTE — Progress Notes (Signed)
PROGRESS NOTE [name]  MVH:[id number] DOB: 10-04-1990 DOA: 03/08/2023 PCP: Elizabeth Palau, FNP  Brief Narrative/Hospital Course:  33 y.o. 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 cyclical vomiting, HTN, hepatic steatosis, uterine fibroid, asthma, mood d/o, THC use who presents with acute onset of N/V this AM. States this is similar to gastroparesis, unclear what caused it to worsen. She is typically on insulin pump but this is currently off, she does not know her off-pump insulin dosing. Using sliding scale. Denies any hematemesis. Per ED had also had diarrhea. She is writhing on the bed and asking for morphine or dilaudid. In the ED vitals are stable besides mild tachycardia afebrile.  Labs with hyperglycemia lactic acidosis, leukocytosis COVID RSV negative, pregnancy hCG serum negative    Subjective: Seen and examined this morning Less nauseous intermittent abdominal pain Overall feeling better Overnight afebrile BP stable Labs shows mild hypokalemia hyperglycemia in 300, lactic acid 3.2 and WBC 15.3   Assessment and Plan: Principal Problem:   Gastroparesis Active Problems:   Hypovolemia   Nausea and vomiting   Intractable nausea vomiting diarrhea Question gastroparesis ?  Cyclical vomiting Hypovolemia Esophagitis: Patient presenting with acute onset nausea vomiting abdominal pain.  Workup with normal LFTs lipase and normal CT abdomen pelvis with contrast-except stable distal esophageal thickening.  Tolerating clear liquid diet advance to full liquid and ADA T, continue scheduled Reglan, IV PPI, Carafate prn Phenergan/antiemetics.  Avoid narcotics. ?gi eval if persistent symptom  Non-anion gap acidosis Lactic acidosis: Likely from dehydration encourage po-given IV fluid boluses this morning will repeat lactic acid to ensure it has resolved   Question of withdrawal syndrome  Urine drug screen pending   Type 1 diabetes:  On insulin pump DM  coordinator following continue long-acting insulin SSI for now  Hypertension: BP stable. no recent fill of antihypertensives  Asthma: Cont prn albuterol  Mood disorder: Unclear if she is still taking bupropion/fluoxetine, follow-up pharmacy rec   DVT prophylaxis: enoxaparin (LOVENOX) injection 40 mg Start: 03/09/23 1000 Code Status:   Code Status: Full Code Family Communication: plan of care discussed with patient at bedside. Patient status is: Remains hospitalized because of severity of illness Level of care: Med-Surg   Dispo: The patient is from: Home            Anticipated disposition: TBD Objective: Vitals last 24 hrs: Vitals:   03/09/23 0052 03/09/23 0155 03/09/23 0558 03/09/23 1001  BP:  128/73 (!) 95/56 110/63  Pulse:  (!) 109 91 (!) 110  Resp:  18 20 20   Temp: 98.2 F (36.8 C) 98.8 F (37.1 C) 98.3 F (36.8 C) (!) 97.5 F (36.4 C)  TempSrc: Oral Oral Oral Oral  SpO2:  100% 100% 100%  Weight:      Height:       Weight change:   Physical Examination: General exam: alert awake,at baseline, older than stated age HEENT:Oral mucosa moist, Ear/Nose WNL grossly Respiratory system: Bilaterally clear BS,no use of accessory muscle Cardiovascular system: S1 & S2 +, No JVD. Gastrointestinal system: Abdomen soft,NT,ND, BS+ Nervous System: Alert, awake, moving all extremities,and following commands. Extremities: LE edema neg,distal peripheral pulses palpable and warm.  Skin: No rashes,no icterus. MSK: Normal muscle bulk,tone, power   Medications reviewed:  Scheduled Meds:  enoxaparin (LOVENOX) injection  40 mg Subcutaneous Q24H   insulin aspart  0-5 Units Subcutaneous QHS   insulin aspart  0-9 Units Subcutaneous TID WC   insulin aspart  10 Units Subcutaneous Once  insulin glargine-yfgn  20 Units Subcutaneous QHS   metoCLOPramide (REGLAN) injection  10 mg Intravenous Q8H   pantoprazole (PROTONIX) IV  40 mg Intravenous Q12H   sucralfate  1 g Oral TID WC & HS    Continuous Infusions:  acetaminophen Stopped (03/09/23 1040)      Diet Order             Diet full liquid Room service appropriate? Yes; Fluid consistency: Thin  Diet effective now                            Intake/Output Summary (Last 24 hours) at 03/09/2023 1227 Last data filed at 03/09/2023 0600 Gross per 24 hour  Intake 683.28 ml  Output --  Net 683.28 ml   Net IO Since Admission: 683.28 mL [03/09/23 1227]  Wt Readings from Last 3 Encounters:  03/08/23 56 kg  12/20/22 56 kg  12/15/22 58.4 kg     Unresulted Labs (From admission, onward)     Start     Ordered   03/10/23 0500  Basic metabolic panel  Daily,   R     Question:  Specimen collection method  Answer:  IV Team=IV Team collect   03/09/23 0935   03/10/23 0500  CBC  Daily,   R     Question:  Specimen collection method  Answer:  IV Team=IV Team collect   03/09/23 0935   03/09/23 1130  Lactic acid, plasma  (Lactic Acid)  Once-Timed,   TIMED       Question:  Specimen collection method  Answer:  IV Team=IV Team collect   03/09/23 1051   03/08/23 2226  Opiate, quantitative, urine  Once,   R        03/08/23 2225   03/08/23 2212  Rapid urine drug screen (hospital performed)  ONCE - STAT,   STAT        03/08/23 2212   03/08/23 1800  Rapid urine drug screen (hospital performed)  Add-on,   AD        03/08/23 1800   03/08/23 1428  Urinalysis, Routine w reflex microscopic -Urine, Clean Catch  Once,   URGENT       Question:  Specimen Source  Answer:  Urine, Clean Catch   03/08/23 1427          Data Reviewed: I have personally reviewed following labs and imaging studies CBC: Recent Labs  Lab 03/08/23 1534 03/09/23 0453  WBC 12.5* 15.3*  HGB 13.4 12.6  HCT 39.6 39.7  MCV 88.8 95.0  PLT 263 237   Basic Metabolic Panel:  Recent Labs  Lab 03/08/23 1534 03/09/23 0453  NA 138 141  K 4.3 3.4*  CL 109 107  CO2 18* 18*  GLUCOSE 236* 316*  BUN 18 21*  CREATININE 0.80 1.01*  CALCIUM 8.9 9.2  MG   --  1.9  PHOS  --  3.5   GFR: Estimated Creatinine Clearance: 65.5 mL/min (A) (by C-G formula based on SCr of 1.01 mg/dL (H)). Liver Function Tests:  Recent Labs  Lab 03/08/23 1534  AST 56*  ALT 34  ALKPHOS 95  BILITOT 1.1  PROT 7.7  ALBUMIN 4.1   Recent Labs  Lab 03/08/23 1534  LIPASE 19   No results for input(s): "HGBA1C" in the last 72 hours. Recent Labs  Lab 03/09/23 0205 03/09/23 0331 03/09/23 0502 03/09/23 0840 03/09/23 1215  GLUCAP 431* 332* 288* 293* 347*  Recent Labs  Lab 03/09/23 0453  LATICACIDVEN 3.2*   Recent Results (from the past 240 hours)  Resp panel by RT-PCR (RSV, Flu A&B, Covid) Anterior Nasal Swab     Status: None   Collection Time: 03/08/23  6:40 PM   Specimen: Anterior Nasal Swab  Result Value Ref Range Status   SARS Coronavirus 2 by RT PCR NEGATIVE NEGATIVE Final    Comment: (NOTE) SARS-CoV-2 target nucleic acids are NOT DETECTED.  The SARS-CoV-2 RNA is generally detectable in upper respiratory specimens during the acute phase of infection. The lowest concentration of SARS-CoV-2 viral copies this assay can detect is 138 copies/mL. A negative result does not preclude SARS-Cov-2 infection and should not be used as the sole basis for treatment or other patient management decisions. A negative result may occur with  improper specimen collection/handling, submission of specimen other than nasopharyngeal swab, presence of viral mutation(s) within the areas targeted by this assay, and inadequate number of viral copies(<138 copies/mL). A negative result must be combined with clinical observations, patient history, and epidemiological information. The expected result is Negative.  Fact Sheet for Patients:  BloggerCourse.com  Fact Sheet for Healthcare Providers:  SeriousBroker.it  This test is no t yet approved or cleared by the [place] FDA and  has been authorized for detection and/or  diagnosis of SARS-CoV-2 by FDA under an Emergency Use Authorization (EUA). This EUA will remain  in effect (meaning this test can be used) for the duration of the COVID-19 declaration under Section 564(b)(1) of the Act, 21 U.S.C.section 360bbb-3(b)(1), unless the authorization is terminated  or revoked sooner.       Influenza A by PCR NEGATIVE NEGATIVE Final   Influenza B by PCR NEGATIVE NEGATIVE Final    Comment: (NOTE) The Xpert Xpress SARS-CoV-2/FLU/RSV plus assay is intended as an aid in the diagnosis of influenza from Nasopharyngeal swab specimens and should not be used as a sole basis for treatment. Nasal washings and aspirates are unacceptable for Xpert Xpress SARS-CoV-2/FLU/RSV testing.  Fact Sheet for Patients: BloggerCourse.com  Fact Sheet for Healthcare Providers: SeriousBroker.it  This test is not yet approved or cleared by the [place] FDA and has been authorized for detection and/or diagnosis of SARS-CoV-2 by FDA under an Emergency Use Authorization (EUA). This EUA will remain in effect (meaning this test can be used) for the duration of the COVID-19 declaration under Section 564(b)(1) of the Act, 21 U.S.C. section 360bbb-3(b)(1), unless the authorization is terminated or revoked.     Resp Syncytial Virus by PCR NEGATIVE NEGATIVE Final    Comment: (NOTE) Fact Sheet for Patients: BloggerCourse.com  Fact Sheet for Healthcare Providers: SeriousBroker.it  This test is not yet approved or cleared by the [place] FDA and has been authorized for detection and/or diagnosis of SARS-CoV-2 by FDA under an Emergency Use Authorization (EUA). This EUA will remain in effect (meaning this test can be used) for the duration of the COVID-19 declaration under Section 564(b)(1) of the Act, 21 U.S.C. section 360bbb-3(b)(1), unless the authorization is terminated  or revoked.  Performed at [facility], [address], [place], [place] [address]     Antimicrobials/Microbiology: Anti-infectives (From admission, onward)    None         Component Value Date/Time   SDES  12/11/2022 1015    URINE, CLEAN CATCH Performed at [facility], [address], [place], [place] [address]    SPECREQUEST  12/11/2022 1015    NONE Performed at  [facility], [address], [place], [place] [address]    CULT (A) 12/11/2022 1015    <10,000 COLONIES/mL INSIGNIFICANT GROWTH Performed at [facility], [address], [place], [place] [address]    REPTSTATUS 12/12/2022 FINAL 12/11/2022 1015     Radiology Studies: CT ABDOMEN PELVIS W CONTRAST Result Date: 03/09/2023 CLINICAL DATA:  Abdominal pain and vomiting, history of gastroparesis EXAM: CT ABDOMEN AND PELVIS WITH CONTRAST TECHNIQUE: Multidetector CT imaging of the abdomen and pelvis was performed using the standard protocol following bolus administration of intravenous contrast. RADIATION DOSE REDUCTION: This exam was performed according to the departmental dose-optimization program which includes automated exposure control, adjustment of the mA and/or kV according to patient size and/or use of iterative reconstruction technique. CONTRAST:  80mL OMNIPAQUE IOHEXOL 300 MG/ML  SOLN COMPARISON:  12/15/2022 FINDINGS: Lower chest: No acute abnormality. Hepatobiliary: No focal liver abnormality is seen. Status post cholecystectomy. No biliary dilatation. Pancreas: Unremarkable. No pancreatic ductal dilatation or surrounding inflammatory changes. Spleen: Normal in size without focal abnormality. Adrenals/Urinary Tract: Adrenal glands are within normal limits. Kidneys demonstrate a normal enhancement pattern bilaterally. No renal calculi or obstructive changes are seen. The bladder is well distended. Stomach/Bowel: No obstructive or inflammatory changes of the  colon are seen. The appendix is not well visualized. No inflammatory changes to suggest appendicitis are noted. Small bowel and stomach are within normal limits. Stable thickening of the distal esophagus is noted unchanged from the prior exam. Vascular/Lymphatic: No significant vascular findings are present. No enlarged abdominal or pelvic lymph nodes. Reproductive: Uterus is well visualized. Rounded hyperdensity is noted measuring up to 3.9 cm consistent with uterine fibroid. This is stable from the prior exam. Mass is seen. Other: No abdominal wall hernia or abnormality. No abdominopelvic ascites. Musculoskeletal: No acute or significant osseous findings. IMPRESSION: Stable leiomyoma. Stable distal esophageal thickening. No acute abnormality to correspond with the given clinical history is noted. Electronically Signed   By: Alcide Clever M.D.   On: 03/09/2023 01:47     LOS: 0 days   Total time spent in review of labs and imaging, patient evaluation, formulation of plan, documentation and communication with family: 35 minutes  Lanae Boast, MD  [facility]  03/09/2023, 12:27 PM

## 2023-03-09 NOTE — Progress Notes (Signed)
administered robaxin and Bentyl at 1448 patient threw it up 10 min later. She stated she had nausea so I have her phenergan suppository. at 1503. She just came out her room to the nursing station and is requesting for more pain medications. She states the pain is getting worse.  Lanae Boast MD notified

## 2023-03-09 NOTE — Progress Notes (Signed)
Restarted IV Tylenol. Established IV

## 2023-03-09 NOTE — Inpatient Diabetes Management (Signed)
Inpatient Diabetes Program Recommendations  AACE/ADA: New Consensus Statement on Inpatient Glycemic Control (2015)  Target Ranges:  Prepandial:   less than 140 mg/dL      Peak postprandial:   less than 180 mg/dL (1-2 hours)      Critically ill patients:  140 - 180 mg/dL   Lab Results  Component Value Date   GLUCAP 293 (H) 03/09/2023   HGBA1C 7.4 (H) 12/09/2022    Review of Glycemic Control  Diabetes history: DM1  Outpatient Diabetes medications:  Tandem insulin pump with the Dexcom G7 Levy-last visit was 04/22/22 Tandem pump. BASAL 0.95 (22.8 total). Insulin:carb ratio 1 units/9 carbs. ISF 40. G7  Current orders for Inpatient glycemic control: Semglee 20 units every day, Novolog 0-9 units TID and 0-5 units at bedtime, 10 units TID  Met with patient at bedside.  She confirms use of insulin pump.  Sees endocrinology, Dr. Shawnee Knapp.  She has an appointment with him in March.  She wears her Dexcom G7.  Her pump settings are listed above.  No changes in pump settings since last visit with Dr. Shawnee Knapp last year.    Will continue to follow while inpatient.  Thank you, Dulce Sellar, MSN, CDCES Diabetes Coordinator Inpatient Diabetes Program [phone] (team pager from 8a-5p)

## 2023-03-09 NOTE — Progress Notes (Incomplete)
mid

## 2023-03-09 NOTE — Hospital Course (Addendum)
 33 y.o. 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 cyclical vomiting, HTN, hepatic steatosis, uterine fibroid, asthma, mood d/o, THC use who presents with acute onset of N/V this AM. States this is similar to gastroparesis, unclear what caused it to worsen. She is typically on insulin pump but this is currently off, she does not know her off-pump insulin dosing. Using sliding scale. Denies any hematemesis. Per ED had also had diarrhea. She is writhing on the bed and asking for morphine or dilaudid. In the ED vitals are stable besides mild tachycardia afebrile.  Labs with hyperglycemia lactic acidosis, leukocytosis COVID RSV negative, pregnancy hCG serum negative.  Managing IV fluid hydration and DIET ADAT Continued on Reglan scheduled along with as needed pain medication Phenergan-QTc monitored stable at 428 on 2/22 At this time patient feels improved. She is tolerating diet she is requesting for discharge home today

## 2023-03-09 NOTE — TOC CM/SW Note (Signed)
Transition of Care [facility]) - Inpatient Brief Assessment   Patient Details  Name: [name] MRN: [id number] Date of Birth: 08-10-1990  Transition of Care [facility]) CM/SW Contact:    Larrie Kass, LCSW Phone Number: 03/09/2023, 3:11 PM   Clinical Narrative:  Pt has PCP and no SDOH concerns. TOC sign off.   Transition of Care Asessment: Insurance and Status: Insurance coverage has been reviewed Patient has primary care physician: Yes Home environment has been reviewed: home with self Prior level of function:: independent Prior/Current Home Services: No current home services Social Drivers of Health Review: SDOH reviewed no interventions necessary Readmission risk has been reviewed: Yes Transition of care needs: no transition of care needs at this time

## 2023-03-10 DIAGNOSIS — K3184 Gastroparesis: Secondary | ICD-10-CM | POA: Diagnosis not present

## 2023-03-10 LAB — GLUCOSE, CAPILLARY
Glucose-Capillary: 227 mg/dL — ABNORMAL HIGH (ref 70–99)
Glucose-Capillary: 268 mg/dL — ABNORMAL HIGH (ref 70–99)
Glucose-Capillary: 186 mg/dL — ABNORMAL HIGH (ref 70–99)
Glucose-Capillary: 199 mg/dL — ABNORMAL HIGH (ref 70–99)

## 2023-03-10 LAB — RAPID URINE DRUG SCREEN, HOSP PERFORMED
Amphetamines: NOT DETECTED
Barbiturates: NOT DETECTED
Benzodiazepines: NOT DETECTED
Cocaine: NOT DETECTED
Opiates: POSITIVE — AB
Tetrahydrocannabinol: NOT DETECTED

## 2023-03-10 LAB — BASIC METABOLIC PANEL
Anion gap: 8 (ref 5–15)
BUN: 17 mg/dL (ref 6–20)
CO2: 19 mmol/L — ABNORMAL LOW (ref 22–32)
Calcium: 8.5 mg/dL — ABNORMAL LOW (ref 8.9–10.3)
Chloride: 108 mmol/L (ref 98–111)
Creatinine, Ser: 0.81 mg/dL (ref 0.44–1.00)
GFR, Estimated: >60 mL/min (ref >60)
Glucose, Bld: 262 mg/dL — ABNORMAL HIGH (ref 70–99)
Potassium: 3.5 mmol/L (ref 3.5–5.1)
Sodium: 135 mmol/L (ref 135–145)

## 2023-03-10 LAB — URINALYSIS, ROUTINE W REFLEX MICROSCOPIC
Bacteria, UA: NONE SEEN
Bilirubin Urine: NEGATIVE
Glucose, UA: >=500 mg/dL — AB
Ketones, ur: 80 mg/dL — AB
Nitrite: NEGATIVE
Protein, ur: 30 mg/dL — AB
Specific Gravity, Urine: 1.025 (ref 1.005–1.030)
pH: 6 (ref 5.0–8.0)

## 2023-03-10 LAB — CBC
HCT: 36.8 % (ref 36.0–46.0)
Hemoglobin: 12 g/dL (ref 12.0–15.0)
MCH: 29.2 pg (ref 26.0–34.0)
MCHC: 32.6 g/dL (ref 30.0–36.0)
MCV: 89.5 fL (ref 80.0–100.0)
Platelets: 243 10*3/uL (ref 150–400)
RBC: 4.11 MIL/uL (ref 3.87–5.11)
RDW: 12.9 % (ref 11.5–15.5)
WBC: 10.5 10*3/uL (ref 4.0–10.5)
nRBC: 0 % (ref 0.0–0.2)

## 2023-03-10 MED ORDER — MORPHINE SULFATE (PF) 2 MG/ML IV SOLN
2.0000 mg | Freq: Four times a day (QID) | INTRAVENOUS | Status: DC | PRN
  Administered 2023-03-10 – 2023-03-12 (×9): 2 mg via INTRAVENOUS
  Filled 2023-03-10 (×9): qty 1

## 2023-03-10 MED ORDER — LACTATED RINGERS IV SOLN: INTRAVENOUS | Status: AC

## 2023-03-10 MED ORDER — PROMETHAZINE (PHENERGAN) 6.25MG IN NS 50ML IVPB
6.2500 mg | Freq: Four times a day (QID) | INTRAVENOUS | Status: DC | PRN
  Administered 2023-03-10 – 2023-03-13 (×4): 6.25 mg via INTRAVENOUS
  Filled 2023-03-10 (×4): qty 6.25

## 2023-03-10 NOTE — Plan of Care (Signed)
  Problem: Education: Goal: Ability to describe self-care measures that may prevent or decrease complications (Diabetes Survival Skills Education) will improve Outcome: Progressing   Problem: Fluid Volume: Goal: Ability to maintain a balanced intake and output will improve Outcome: Progressing

## 2023-03-10 NOTE — Plan of Care (Signed)
  Problem: Coping: Goal: Ability to adjust to condition or change in health will improve Outcome: Progressing   Problem: Metabolic: Goal: Ability to maintain appropriate glucose levels will improve Outcome: Progressing   Problem: Clinical Measurements: Goal: Diagnostic test results will improve Outcome: Progressing Goal: Respiratory complications will improve Outcome: Progressing Goal: Cardiovascular complication will be avoided Outcome: Progressing   Problem: Pain Managment: Goal: General experience of comfort will improve and/or be controlled Outcome: Progressing

## 2023-03-10 NOTE — Progress Notes (Signed)
PROGRESS NOTE [name]  ZOX:[id number] DOB: 02/14/1990 DOA: 03/08/2023 PCP: Elizabeth Palau, FNP  Brief Narrative/Hospital Course:  33 y.o. 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 cyclical vomiting, HTN, hepatic steatosis, uterine fibroid, asthma, mood d/o, THC use who presents with acute onset of N/V this AM. States this is similar to gastroparesis, unclear what caused it to worsen. She is typically on insulin pump but this is currently off, she does not know her off-pump insulin dosing. Using sliding scale. Denies any hematemesis. Per ED had also had diarrhea. She is writhing on the bed and asking for morphine or dilaudid. In the ED vitals are stable besides mild tachycardia afebrile.  Labs with hyperglycemia lactic acidosis, leukocytosis COVID RSV negative, pregnancy hCG serum negative   Subjective: Patient seen examined this morning Still complains of nausea difficulty tolerating diet and abdominal pain requesting for Phenergan  And also pain medication.   Overnight afebrile BP stable Labs with blood sugar in 220s   Assessment and Plan: Principal Problem:   Gastroparesis Active Problems:   Hypovolemia   Nausea and vomiting   Intractable nausea vomiting diarrhea Question gastroparesis ?Cyclical vomiting Hypovolemia w/ ketonuria Esophagitis: Patient presenting with acute onset nausea vomiting abdominal pain.  Workup with normal LFTs lipase and normal CT abdomen pelvis with contrast-except stable distal esophageal thickening.  Was tolerating clear liquid diet advanced to full liquid and ADAT, ongoing nausea and pain.  Continue Reglan scheduled, add Phenergan, morphine PRN.discussed the side effect opiates and WILL try to minimize opiate use.    Non-anion gap acidosis Lactic acidosis: Likely from dehydration encourage po-given IV fluid boluses-lactic acid downtrending, will keep on IV fluid hydration due to poor diet tolerance   Question of withdrawal  syndrome  Urine drug screen pending   Type 1 diabetes control hyperglycemia:  On insulin pump DM coordinator following continue long-acting insulin 20 units, SSI for now.  Add NovoLog 30 Histade with meals if continues to eat at least 50% Recent Labs  Lab 03/09/23 0840 03/09/23 1215 03/09/23 1625 03/09/23 2118 03/10/23 0800  GLUCAP 293* 347* 213* 276* 227*    Hypertension: BP stable. no recent fill of antihypertensives  Asthma: Not wheezing.  Cont prn albuterol  Mood disorder: Unclear if she is still taking bupropion/fluoxetine, follow-up pharmacy rec  DVT prophylaxis: enoxaparin (LOVENOX) injection 40 mg Start: 03/09/23 1000 Code Status:   Code Status: Full Code Family Communication: plan of care discussed with patient at bedside. Patient status is: Remains hospitalized because of severity of illness Level of care: Med-Surg   Dispo: The patient is from: Home            Anticipated disposition: Anticipate discharge tomorrow if tolerating diet  Objective: Vitals last 24 hrs: Vitals:   03/09/23 1442 03/09/23 1747 03/09/23 2044 03/10/23 0512  BP: 101/62 (!) 146/94 (!) 141/90 113/62  Pulse: 80 (!) 102 (!) 109 96  Resp:   16 16  Temp:  98.6 F (37 C) 98.2 F (36.8 C) 98.9 F (37.2 C)  TempSrc:  Oral Oral Oral  SpO2: 100% 100% 100% 100%  Weight:      Height:       Weight change:   Physical Examination: General exam: alert awake, oriented at baseline, older than stated age HEENT:Oral mucosa moist, Ear/Nose WNL grossly Respiratory system: Bilaterally clear BS,no use of accessory muscle Cardiovascular system: S1 & S2 +, No JVD. Gastrointestinal system: Abdomen soft mild generalized tenderness with voluntary guarding,ND, BS+ Nervous System: Alert, awake,  moving all extremities,and following commands. Extremities: LE edema neg,distal peripheral pulses palpable and warm.  Skin: No rashes,no icterus. MSK: Normal muscle bulk,tone, power   Medications reviewed:   Scheduled Meds:  enoxaparin (LOVENOX) injection  40 mg Subcutaneous Q24H   FLUoxetine  20 mg Oral q morning   hydrOXYzine  50 mg Oral q morning   insulin aspart  0-5 Units Subcutaneous QHS   insulin aspart  0-9 Units Subcutaneous TID WC   insulin aspart  10 Units Subcutaneous Once   insulin glargine-yfgn  20 Units Subcutaneous QHS   metoCLOPramide (REGLAN) injection  10 mg Intravenous Q8H   pantoprazole (PROTONIX) IV  40 mg Intravenous Q12H   sucralfate  1 g Oral TID WC & HS   Continuous Infusions:  promethazine (PHENERGAN) injection (IM or IVPB)        Diet Order             Diet full liquid Room service appropriate? Yes; Fluid consistency: Thin  Diet effective now                  Intake/Output Summary (Last 24 hours) at 03/10/2023 1204 Last data filed at 03/09/2023 1905 Gross per 24 hour  Intake 27.18 ml  Output --  Net 27.18 ml   Net IO Since Admission: 710.46 mL [03/10/23 1204]  Wt Readings from Last 3 Encounters:  03/08/23 56 kg  12/20/22 56 kg  12/15/22 58.4 kg     Unresulted Labs (From admission, onward)     Start     Ordered   03/10/23 1140  Rapid urine drug screen (hospital performed)  ONCE - STAT,   STAT        03/10/23 1139   03/10/23 1139  Opiate, quantitative, urine  Once,   R        03/10/23 1139   03/10/23 1139  Rapid urine drug screen (hospital performed)  Add-on,   AD        03/10/23 1139   03/10/23 0500  Basic metabolic panel  Daily,   R     Question:  Specimen collection method  Answer:  IV Team=IV Team collect   03/09/23 0935   03/10/23 0500  CBC  Daily,   R     Question:  Specimen collection method  Answer:  IV Team=IV Team collect   03/09/23 0935          Data Reviewed: I have personally reviewed following labs and imaging studies CBC: Recent Labs  Lab 03/08/23 1534 03/09/23 0453 03/10/23 0506  WBC 12.5* 15.3* 10.5  HGB 13.4 12.6 12.0  HCT 39.6 39.7 36.8  MCV 88.8 95.0 89.5  PLT 263 237 243   Basic Metabolic Panel:   Recent Labs  Lab 03/08/23 1534 03/09/23 0453 03/10/23 0506  NA 138 141 135  K 4.3 3.4* 3.5  CL 109 107 108  CO2 18* 18* 19*  GLUCOSE 236* 316* 262*  BUN 18 21* 17  CREATININE 0.80 1.01* 0.81  CALCIUM 8.9 9.2 8.5*  MG  --  1.9  --   PHOS  --  3.5  --    GFR: Estimated Creatinine Clearance: 81.7 mL/min (by C-G formula based on SCr of 0.81 mg/dL). Liver Function Tests:  Recent Labs  Lab 03/08/23 1534  AST 56*  ALT 34  ALKPHOS 95  BILITOT 1.1  PROT 7.7  ALBUMIN 4.1   Recent Labs  Lab 03/08/23 1534  LIPASE 19   No results for input(s): "HGBA1C" in  the last 72 hours. Recent Labs  Lab 03/09/23 0840 03/09/23 1215 03/09/23 1625 03/09/23 2118 03/10/23 0800  GLUCAP 293* 347* 213* 276* 227*   Recent Labs  Lab 03/09/23 0453 03/09/23 1530  LATICACIDVEN 3.2* 2.4*   Recent Results (from the past 240 hours)  Resp panel by RT-PCR (RSV, Flu A&B, Covid) Anterior Nasal Swab     Status: None   Collection Time: 03/08/23  6:40 PM   Specimen: Anterior Nasal Swab  Result Value Ref Range Status   SARS Coronavirus 2 by RT PCR NEGATIVE NEGATIVE Final    Comment: (NOTE) SARS-CoV-2 target nucleic acids are NOT DETECTED.  The SARS-CoV-2 RNA is generally detectable in upper respiratory specimens during the acute phase of infection. The lowest concentration of SARS-CoV-2 viral copies this assay can detect is 138 copies/mL. A negative result does not preclude SARS-Cov-2 infection and should not be used as the sole basis for treatment or other patient management decisions. A negative result may occur with  improper specimen collection/handling, submission of specimen other than nasopharyngeal swab, presence of viral mutation(s) within the areas targeted by this assay, and inadequate number of viral copies(<138 copies/mL). A negative result must be combined with clinical observations, patient history, and epidemiological information. The expected result is Negative.  Fact Sheet for  Patients:  BloggerCourse.com  Fact Sheet for Healthcare Providers:  SeriousBroker.it  This test is no t yet approved or cleared by the [place] FDA and  has been authorized for detection and/or diagnosis of SARS-CoV-2 by FDA under an Emergency Use Authorization (EUA). This EUA will remain  in effect (meaning this test can be used) for the duration of the COVID-19 declaration under Section 564(b)(1) of the Act, 21 U.S.C.section 360bbb-3(b)(1), unless the authorization is terminated  or revoked sooner.       Influenza A by PCR NEGATIVE NEGATIVE Final   Influenza B by PCR NEGATIVE NEGATIVE Final    Comment: (NOTE) The Xpert Xpress SARS-CoV-2/FLU/RSV plus assay is intended as an aid in the diagnosis of influenza from Nasopharyngeal swab specimens and should not be used as a sole basis for treatment. Nasal washings and aspirates are unacceptable for Xpert Xpress SARS-CoV-2/FLU/RSV testing.  Fact Sheet for Patients: BloggerCourse.com  Fact Sheet for Healthcare Providers: SeriousBroker.it  This test is not yet approved or cleared by the [place] FDA and has been authorized for detection and/or diagnosis of SARS-CoV-2 by FDA under an Emergency Use Authorization (EUA). This EUA will remain in effect (meaning this test can be used) for the duration of the COVID-19 declaration under Section 564(b)(1) of the Act, 21 U.S.C. section 360bbb-3(b)(1), unless the authorization is terminated or revoked.     Resp Syncytial Virus by PCR NEGATIVE NEGATIVE Final    Comment: (NOTE) Fact Sheet for Patients: BloggerCourse.com  Fact Sheet for Healthcare Providers: SeriousBroker.it  This test is not yet approved or cleared by the [place] FDA and has been authorized for detection and/or diagnosis of SARS-CoV-2 by FDA under an Emergency  Use Authorization (EUA). This EUA will remain in effect (meaning this test can be used) for the duration of the COVID-19 declaration under Section 564(b)(1) of the Act, 21 U.S.C. section 360bbb-3(b)(1), unless the authorization is terminated or revoked.  Performed at [facility], [address], [place], [place] [address]     Antimicrobials/Microbiology: Anti-infectives (From admission, onward)    None         Component Value Date/Time   SDES  12/11/2022 1015  URINE, CLEAN CATCH Performed at [facility], [address] [place], [place] [address]    SPECREQUEST  12/11/2022 1015    NONE Performed at [facility], [address], [place], [place] [address]    CULT (A) 12/11/2022 1015    <10,000 COLONIES/mL INSIGNIFICANT GROWTH Performed at [facility], [address] [place], [place] [address]    REPTSTATUS 12/12/2022 FINAL 12/11/2022 1015     Radiology Studies: CT ABDOMEN PELVIS W CONTRAST Result Date: 03/09/2023 CLINICAL DATA:  Abdominal pain and vomiting, history of gastroparesis EXAM: CT ABDOMEN AND PELVIS WITH CONTRAST TECHNIQUE: Multidetector CT imaging of the abdomen and pelvis was performed using the standard protocol following bolus administration of intravenous contrast. RADIATION DOSE REDUCTION: This exam was performed according to the departmental dose-optimization program which includes automated exposure control, adjustment of the mA and/or kV according to patient size and/or use of iterative reconstruction technique. CONTRAST:  80mL OMNIPAQUE IOHEXOL 300 MG/ML  SOLN COMPARISON:  12/15/2022 FINDINGS: Lower chest: No acute abnormality. Hepatobiliary: No focal liver abnormality is seen. Status post cholecystectomy. No biliary dilatation. Pancreas: Unremarkable. No pancreatic ductal dilatation or surrounding inflammatory changes. Spleen: Normal in size without focal abnormality. Adrenals/Urinary Tract:  Adrenal glands are within normal limits. Kidneys demonstrate a normal enhancement pattern bilaterally. No renal calculi or obstructive changes are seen. The bladder is well distended. Stomach/Bowel: No obstructive or inflammatory changes of the colon are seen. The appendix is not well visualized. No inflammatory changes to suggest appendicitis are noted. Small bowel and stomach are within normal limits. Stable thickening of the distal esophagus is noted unchanged from the prior exam. Vascular/Lymphatic: No significant vascular findings are present. No enlarged abdominal or pelvic lymph nodes. Reproductive: Uterus is well visualized. Rounded hyperdensity is noted measuring up to 3.9 cm consistent with uterine fibroid. This is stable from the prior exam. Mass is seen. Other: No abdominal wall hernia or abnormality. No abdominopelvic ascites. Musculoskeletal: No acute or significant osseous findings. IMPRESSION: Stable leiomyoma. Stable distal esophageal thickening. No acute abnormality to correspond with the given clinical history is noted. Electronically Signed   By: Alcide Clever M.D.   On: 03/09/2023 01:47     LOS: 1 day   Total time spent in review of labs and imaging, patient evaluation, formulation of plan, documentation and communication with family: 35 minutes  Lanae Boast, MD  [facility]  03/10/2023, 12:04 PM

## 2023-03-10 NOTE — Inpatient Diabetes Management (Addendum)
Inpatient Diabetes Program Recommendations  AACE/ADA: New Consensus Statement on Inpatient Glycemic Control (2015)  Target Ranges:  Prepandial:   less than 140 mg/dL      Peak postprandial:   less than 180 mg/dL (1-2 hours)      Critically ill patients:  140 - 180 mg/dL   Lab Results  Component Value Date   GLUCAP 227 (H) 03/10/2023   HGBA1C 7.4 (H) 12/09/2022    Review of Glycemic Control  Latest Reference Range & Units 03/09/23 08:40 03/09/23 12:15 03/09/23 16:25 03/09/23 21:18 03/10/23 08:00  Glucose-Capillary 70 - 99 mg/dL 161 (H) 096 (H) 045 (H) 276 (H) 227 (H)  (H): Data is abnormally high  Diabetes history: DM1   Outpatient Diabetes medications:  Tandem insulin pump with the Dexcom G7 Levy-last visit was 04/22/22 Tandem pump. BASAL 0.95 (22.8 total). Insulin:carb ratio 1 units/9 carbs. ISF 40. G7  Current inpatient diabetes medications:  Semglee 20 units at bedtime, Novolog 0-9 units TID and 0-5 units QHS  Inpatient Diabetes Program Recommendations:    Please consider:  Semglee 25 units at bedtime Novolog 3 units TID with meals if she consumes at least 50%  Will continue to follow while inpatient.  Thank you, Dulce Sellar, MSN, CDCES Diabetes Coordinator Inpatient Diabetes Program [phone] (team pager from 8a-5p)

## 2023-03-11 DIAGNOSIS — K3184 Gastroparesis: Secondary | ICD-10-CM | POA: Diagnosis not present

## 2023-03-11 LAB — GLUCOSE, CAPILLARY
Glucose-Capillary: 160 mg/dL — ABNORMAL HIGH (ref 70–99)
Glucose-Capillary: 180 mg/dL — ABNORMAL HIGH (ref 70–99)
Glucose-Capillary: 242 mg/dL — ABNORMAL HIGH (ref 70–99)
Glucose-Capillary: 202 mg/dL — ABNORMAL HIGH (ref 70–99)

## 2023-03-11 LAB — BASIC METABOLIC PANEL
Anion gap: 12 (ref 5–15)
BUN: 13 mg/dL (ref 6–20)
CO2: 22 mmol/L (ref 22–32)
Calcium: 8.9 mg/dL (ref 8.9–10.3)
Chloride: 103 mmol/L (ref 98–111)
Creatinine, Ser: 0.64 mg/dL (ref 0.44–1.00)
GFR, Estimated: >60 mL/min (ref >60)
Glucose, Bld: 232 mg/dL — ABNORMAL HIGH (ref 70–99)
Potassium: 3.3 mmol/L — ABNORMAL LOW (ref 3.5–5.1)
Sodium: 137 mmol/L (ref 135–145)

## 2023-03-11 LAB — CBC
HCT: 38.4 % (ref 36.0–46.0)
Hemoglobin: 12.5 g/dL (ref 12.0–15.0)
MCH: 29.8 pg (ref 26.0–34.0)
MCHC: 32.6 g/dL (ref 30.0–36.0)
MCV: 91.6 fL (ref 80.0–100.0)
Platelets: 244 10*3/uL (ref 150–400)
RBC: 4.19 MIL/uL (ref 3.87–5.11)
RDW: 12.7 % (ref 11.5–15.5)
WBC: 6.5 10*3/uL (ref 4.0–10.5)
nRBC: 0 % (ref 0.0–0.2)

## 2023-03-11 MED ORDER — METOCLOPRAMIDE HCL 10 MG PO TABS: 10.0000 mg | ORAL_TABLET | Freq: Three times a day (TID) | ORAL | 0 refills | Status: AC | PRN

## 2023-03-11 MED ORDER — PANTOPRAZOLE SODIUM 40 MG PO TBEC: 40.0000 mg | DELAYED_RELEASE_TABLET | Freq: Every day | ORAL | 0 refills | Status: AC

## 2023-03-11 MED ORDER — SUCRALFATE 1 GM/10ML PO SUSP: 1.0000 g | Freq: Three times a day (TID) | ORAL | 0 refills | Status: AC

## 2023-03-11 MED ORDER — POTASSIUM CHLORIDE CRYS ER 20 MEQ PO TBCR
40.0000 meq | EXTENDED_RELEASE_TABLET | Freq: Once | ORAL | Status: AC
  Administered 2023-03-11: 40 meq via ORAL
  Filled 2023-03-11: qty 2

## 2023-03-11 MED ORDER — LACTATED RINGERS IV SOLN: INTRAVENOUS | Status: AC

## 2023-03-11 NOTE — Progress Notes (Signed)
PROGRESS NOTE [name]  IEP:[id number] DOB: [birth year] DOA: 03/08/2023 PCP: Elizabeth Palau, FNP  Brief Narrative/Hospital Course:  33 y.o. 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 cyclical vomiting, HTN, hepatic steatosis, uterine fibroid, asthma, mood d/o, THC use who presents with acute onset of N/V this AM. States this is similar to gastroparesis, unclear what caused it to worsen. She is typically on insulin pump but this is currently off, she does not know her off-pump insulin dosing. Using sliding scale. Denies any hematemesis. Per ED had also had diarrhea. She is writhing on the bed and asking for morphine or dilaudid. In the ED vitals are stable besides mild tachycardia afebrile.  Labs with hyperglycemia lactic acidosis, leukocytosis COVID RSV negative, pregnancy hCG serum negative.  Managing IV fluid hydration diet slowly advanced continue with pain management.  This time tolerating diet.  If she does okay with lunch she will be discharged home with instruction to follow-up with her PCP and GI   Subjective: Patient seen examined this morning mild nausea overall feeling well, agreed for soft diet but nursing reports he did not tolerate    Assessment and Plan: Principal Problem:   Gastroparesis Active Problems:   Hypovolemia   Nausea and vomiting   Intractable nausea vomiting diarrhea Question gastroparesis ?Cyclical vomiting Hypovolemia w/ ketonuria Esophagitis: Patient presenting with acute onset nausea vomiting abdominal pain.  Workup with normal LFTs lipase and normal CT abdomen pelvis with contrast-except stable distal esophageal thickening.  Diet advanced to soft diet did not tolerate and vomited.  Will continue full liquid diet, IV fluid hydration and advance as tolerated continue with PPI pain management Reglan Phenergan .  Minimize opiate use.    Non-anion gap acidosis Lactic acidosis: Likely from dehydration encouraged po.  Patient fluid  boluses at this time doing well    Question of withdrawal syndrome : UDS positive for  opiate which she has been getting  Type 1 diabetes control hyperglycemia: On insulin pump DM coordinator following treating her long-acting insulin while here along with sliding scale,, she will resume her home insulin pump 24 hours post long-acting insulin dose.     Hypertension: BP stable. no recent fill of antihypertensives  Asthma: Not wheezing.  Cont prn albuterol  Mood disorder: Unclear if she is still taking bupropion/fluoxetine  IV infiltration, with arms and swelling Pain has improved  DVT prophylaxis: enoxaparin (LOVENOX) injection 40 mg Start: 03/09/23 1000 Code Status:   Code Status: Full Code Family Communication: plan of care discussed with patient at bedside. Patient status is: Remains hospitalized because of severity of illness Level of care: Med-Surg   Dispo: The patient is from: Home            Anticipated disposition: Anticipate discharge tomorrow if tolerating diet  Objective: Vitals last 24 hrs: Vitals:   03/10/23 0512 03/10/23 1211 03/10/23 2011 03/11/23 0419  BP: 113/62 (!) 149/96 (!) 153/96 (!) 142/92  Pulse: 96 (!) 102 98 91  Resp: 16 16 14 15   Temp: 98.9 F (37.2 C) 98 F (36.7 C) 98.1 F (36.7 C) 98.3 F (36.8 C)  TempSrc: Oral Oral Oral   SpO2: 100% 100% 100% 100%  Weight:      Height:       Weight change:   Physical Examination: General exam: alert awake, oriented  HEENT:Oral mucosa moist, Ear/Nose WNL grossly Respiratory system: Bilaterally clear BS,no use of accessory muscle Cardiovascular system: S1 & S2 +, No JVD. Gastrointestinal system: Abdomen soft,NT,ND, BS+  Nervous System: Alert, awake, moving all extremities,and following commands. Extremities: LE edema neg,distal peripheral pulses palpable and warm.  Skin: No rashes,no icterus. MSK: Normal muscle bulk,tone, power   Medications reviewed:  Scheduled Meds:  enoxaparin (LOVENOX)  injection  40 mg Subcutaneous Q24H   FLUoxetine  20 mg Oral q morning   hydrOXYzine  50 mg Oral q morning   insulin aspart  0-5 Units Subcutaneous QHS   insulin aspart  0-9 Units Subcutaneous TID WC   insulin aspart  10 Units Subcutaneous Once   insulin glargine-yfgn  20 Units Subcutaneous QHS   metoCLOPramide (REGLAN) injection  10 mg Intravenous Q8H   pantoprazole (PROTONIX) IV  40 mg Intravenous Q12H   sucralfate  1 g Oral TID WC & HS   Continuous Infusions:  lactated ringers 75 mL/hr at 03/11/23 0242   promethazine (PHENERGAN) injection (IM or IVPB) 6.25 mg (03/10/23 1317)      Diet Order             DIET SOFT Room service appropriate? Yes; Fluid consistency: Thin  Diet effective now                  Intake/Output Summary (Last 24 hours) at 03/11/2023 1204 Last data filed at 03/11/2023 0600 Gross per 24 hour  Intake 1804.3 ml  Output --  Net 1804.3 ml   Net IO Since Admission: 2,514.76 mL [03/11/23 1204]  Wt Readings from Last 3 Encounters:  03/08/23 56 kg  12/20/22 56 kg  12/15/22 58.4 kg     Unresulted Labs (From admission, onward)     Start     Ordered   03/10/23 1139  Opiate, quantitative, urine  Once,   R        03/10/23 1139   03/10/23 0500  Basic metabolic panel  Daily,   R     Question:  Specimen collection method  Answer:  IV Team=IV Team collect   03/09/23 0935   03/10/23 0500  CBC  Daily,   R     Question:  Specimen collection method  Answer:  IV Team=IV Team collect   03/09/23 0935          Data Reviewed: I have personally reviewed following labs and imaging studies CBC: Recent Labs  Lab 03/08/23 1534 03/09/23 0453 03/10/23 0506 03/11/23 0446  WBC 12.5* 15.3* 10.5 6.5  HGB 13.4 12.6 12.0 12.5  HCT 39.6 39.7 36.8 38.4  MCV 88.8 95.0 89.5 91.6  PLT 263 237 243 244   Basic Metabolic Panel:  Recent Labs  Lab 03/08/23 1534 03/09/23 0453 03/10/23 0506 03/11/23 0446  NA 138 141 135 137  K 4.3 3.4* 3.5 3.3*  CL 109 107 108 103   CO2 18* 18* 19* 22  GLUCOSE 236* 316* 262* 232*  BUN 18 21* 17 13  CREATININE 0.80 1.01* 0.81 0.64  CALCIUM 8.9 9.2 8.5* 8.9  MG  --  1.9  --   --   PHOS  --  3.5  --   --    GFR: Estimated Creatinine Clearance: 82.7 mL/min (by C-G formula based on SCr of 0.64 mg/dL). Liver Function Tests:  Recent Labs  Lab 03/08/23 1534  AST 56*  ALT 34  ALKPHOS 95  BILITOT 1.1  PROT 7.7  ALBUMIN 4.1   Recent Labs  Lab 03/08/23 1534  LIPASE 19   No results for input(s): "HGBA1C" in the last 72 hours. Recent Labs  Lab 03/09/23 2118 03/10/23 0800 03/10/23 1209 03/10/23  1611 03/10/23 2132  GLUCAP 276* 227* 268* 186* 199*   Recent Labs  Lab 03/09/23 0453 03/09/23 1530  LATICACIDVEN 3.2* 2.4*   Recent Results (from the past 240 hours)  Resp panel by RT-PCR (RSV, Flu A&B, Covid) Anterior Nasal Swab     Status: None   Collection Time: 03/08/23  6:40 PM   Specimen: Anterior Nasal Swab  Result Value Ref Range Status   SARS Coronavirus 2 by RT PCR NEGATIVE NEGATIVE Final    Comment: (NOTE) SARS-CoV-2 target nucleic acids are NOT DETECTED.  The SARS-CoV-2 RNA is generally detectable in upper respiratory specimens during the acute phase of infection. The lowest concentration of SARS-CoV-2 viral copies this assay can detect is 138 copies/mL. A negative result does not preclude SARS-Cov-2 infection and should not be used as the sole basis for treatment or other patient management decisions. A negative result may occur with  improper specimen collection/handling, submission of specimen other than nasopharyngeal swab, presence of viral mutation(s) within the areas targeted by this assay, and inadequate number of viral copies(<138 copies/mL). A negative result must be combined with clinical observations, patient history, and epidemiological information. The expected result is Negative.  Fact Sheet for Patients:  BloggerCourse.com  Fact Sheet for Healthcare  Providers:  SeriousBroker.it  This test is no t yet approved or cleared by the [place] FDA and  has been authorized for detection and/or diagnosis of SARS-CoV-2 by FDA under an Emergency Use Authorization (EUA). This EUA will remain  in effect (meaning this test can be used) for the duration of the COVID-19 declaration under Section 564(b)(1) of the Act, 21 U.S.C.section 360bbb-3(b)(1), unless the authorization is terminated  or revoked sooner.       Influenza A by PCR NEGATIVE NEGATIVE Final   Influenza B by PCR NEGATIVE NEGATIVE Final    Comment: (NOTE) The Xpert Xpress SARS-CoV-2/FLU/RSV plus assay is intended as an aid in the diagnosis of influenza from Nasopharyngeal swab specimens and should not be used as a sole basis for treatment. Nasal washings and aspirates are unacceptable for Xpert Xpress SARS-CoV-2/FLU/RSV testing.  Fact Sheet for Patients: BloggerCourse.com  Fact Sheet for Healthcare Providers: SeriousBroker.it  This test is not yet approved or cleared by the [place] FDA and has been authorized for detection and/or diagnosis of SARS-CoV-2 by FDA under an Emergency Use Authorization (EUA). This EUA will remain in effect (meaning this test can be used) for the duration of the COVID-19 declaration under Section 564(b)(1) of the Act, 21 U.S.C. section 360bbb-3(b)(1), unless the authorization is terminated or revoked.     Resp Syncytial Virus by PCR NEGATIVE NEGATIVE Final    Comment: (NOTE) Fact Sheet for Patients: BloggerCourse.com  Fact Sheet for Healthcare Providers: SeriousBroker.it  This test is not yet approved or cleared by the [place] FDA and has been authorized for detection and/or diagnosis of SARS-CoV-2 by FDA under an Emergency Use Authorization (EUA). This EUA will remain in effect (meaning this test can be  used) for the duration of the COVID-19 declaration under Section 564(b)(1) of the Act, 21 U.S.C. section 360bbb-3(b)(1), unless the authorization is terminated or revoked.  Performed at [facility], [address], [place], [place] [address]     Antimicrobials/Microbiology: Anti-infectives (From admission, onward)    None         Component Value Date/Time   SDES  12/11/2022 1015    URINE, CLEAN CATCH Performed at [facility], [address] [place],  [place] [address]    SPECREQUEST  12/11/2022 1015    NONE Performed at [facility], [address] [place], [place] [address]    CULT (A) 12/11/2022 1015    <10,000 COLONIES/mL INSIGNIFICANT GROWTH Performed at [facility], [address], [place], [place] [address]    REPTSTATUS 12/12/2022 FINAL 12/11/2022 1015     Radiology Studies: No results found.    LOS: 2 days   Total time spent in review of labs and imaging, patient evaluation, formulation of plan, documentation and communication with family: 35 minutes  Lanae Boast, MD  [facility]  03/11/2023, 12:04 PM

## 2023-03-11 NOTE — Plan of Care (Signed)
   Problem: Education: Goal: Ability to describe self-care measures that may prevent or decrease complications (Diabetes Survival Skills Education) will improve Outcome: Progressing

## 2023-03-11 NOTE — Plan of Care (Signed)
  Problem: Coping: Goal: Ability to adjust to condition or change in health will improve Outcome: Progressing   Problem: Fluid Volume: Goal: Ability to maintain a balanced intake and output will improve Outcome: Progressing   Problem: Clinical Measurements: Goal: Diagnostic test results will improve Outcome: Progressing Goal: Respiratory complications will improve Outcome: Progressing Goal: Cardiovascular complication will be avoided Outcome: Progressing   Problem: Pain Managment: Goal: General experience of comfort will improve and/or be controlled Outcome: Progressing

## 2023-03-12 DIAGNOSIS — K3184 Gastroparesis: Secondary | ICD-10-CM | POA: Diagnosis not present

## 2023-03-12 LAB — GLUCOSE, CAPILLARY
Glucose-Capillary: 147 mg/dL — ABNORMAL HIGH (ref 70–99)
Glucose-Capillary: 184 mg/dL — ABNORMAL HIGH (ref 70–99)
Glucose-Capillary: 192 mg/dL — ABNORMAL HIGH (ref 70–99)
Glucose-Capillary: 139 mg/dL — ABNORMAL HIGH (ref 70–99)
Glucose-Capillary: 160 mg/dL — ABNORMAL HIGH (ref 70–99)

## 2023-03-12 LAB — CBC
HCT: 39.9 % (ref 36.0–46.0)
Hemoglobin: 13.3 g/dL (ref 12.0–15.0)
MCH: 29.6 pg (ref 26.0–34.0)
MCHC: 33.3 g/dL (ref 30.0–36.0)
MCV: 88.9 fL (ref 80.0–100.0)
Platelets: 284 10*3/uL (ref 150–400)
RBC: 4.49 MIL/uL (ref 3.87–5.11)
RDW: 12.2 % (ref 11.5–15.5)
WBC: 6.3 10*3/uL (ref 4.0–10.5)
nRBC: 0 % (ref 0.0–0.2)

## 2023-03-12 LAB — BASIC METABOLIC PANEL
Anion gap: 15 (ref 5–15)
BUN: 8 mg/dL (ref 6–20)
CO2: 19 mmol/L — ABNORMAL LOW (ref 22–32)
Calcium: 9 mg/dL (ref 8.9–10.3)
Chloride: 101 mmol/L (ref 98–111)
Creatinine, Ser: 0.54 mg/dL (ref 0.44–1.00)
GFR, Estimated: >60 mL/min (ref >60)
Glucose, Bld: 133 mg/dL — ABNORMAL HIGH (ref 70–99)
Potassium: 3.2 mmol/L — ABNORMAL LOW (ref 3.5–5.1)
Sodium: 135 mmol/L (ref 135–145)

## 2023-03-12 MED ORDER — POTASSIUM CHLORIDE CRYS ER 20 MEQ PO TBCR
40.0000 meq | EXTENDED_RELEASE_TABLET | ORAL | Status: AC
  Administered 2023-03-12: 40 meq via ORAL
  Filled 2023-03-12 (×2): qty 2

## 2023-03-12 MED ORDER — MORPHINE SULFATE (PF) 2 MG/ML IV SOLN
2.0000 mg | INTRAVENOUS | Status: DC | PRN
  Administered 2023-03-12 – 2023-03-14 (×12): 2 mg via INTRAVENOUS
  Filled 2023-03-12 (×12): qty 1

## 2023-03-12 NOTE — Progress Notes (Signed)
PROGRESS NOTE [name]  WUJ:[id number] DOB: November 06, 1990 DOA: 03/08/2023 PCP: Elizabeth Palau, FNP  Brief Narrative/Hospital Course:  33 y.o. 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 cyclical vomiting, HTN, hepatic steatosis, uterine fibroid, asthma, mood d/o, THC use who presents with acute onset of N/V this AM. States this is similar to gastroparesis, unclear what caused it to worsen. She is typically on insulin pump but this is currently off, she does not know her off-pump insulin dosing. Using sliding scale. Denies any hematemesis. Per ED had also had diarrhea. She is writhing on the bed and asking for morphine or dilaudid. In the ED vitals are stable besides mild tachycardia afebrile.  Labs with hyperglycemia lactic acidosis, leukocytosis COVID RSV negative, pregnancy hCG serum negative.  Managing IV fluid hydration and DIET ADAT  Subjective: Overnight afebrile.  Patient reports ongoing pain she reports she threw up after eating.She is requesting her meds every 4 hours AS PUSH Phenergan which is not allowed She is able to ambulate around, last bowel movement 2/18   Assessment and Plan: Principal Problem:   Gastroparesis Active Problems:   Hypovolemia   Nausea and vomiting   Intractable nausea vomiting diarrhea Question gastroparesis w/ cyclical vomiting Hypovolemia w/ ketonuria Esophagitis: Patient presenting with acute onset nausea vomiting abdominal pain.  Workup with normal LFTs lipase and normal CT abdomen pelvis with contrast-except stable distal esophageal thickening.  Unable to tolerate diet well.  Continue Protonix PPI, IV morphine prn. Again discussed with him to minimize opiates with the patient.  Continue IV fluids and Reglan.  Non-anion gap acidosis Lactic acidosis Hypokalemia: Likely from dehydration encouraged po. Got bolus ivf, continue diet evaluation   Question of withdrawal syndrome : UDS positive for  opiate which she has been  getting  Type 1 diabetes control hyperglycemia: On insulin pump DM coordinator following treating her long-acting insulin while here along with sliding scale,she will resume her home insulin pump 24 hours post long-acting insulin dose.  Blood sugar remains fairly controlled as below. Recent Labs  Lab 03/11/23 1204 03/11/23 1615 03/11/23 2053 03/12/23 0757 03/12/23 1119  GLUCAP 180* 242* 202* 147* 184*      Hypertension: BP stable. no recent fill of antihypertensives  Asthma: Not wheezing.  Cont prn albuterol  Mood disorder: Unclear if she is still taking bupropion/fluoxetine  DVT prophylaxis: enoxaparin (LOVENOX) injection 40 mg Start: 03/09/23 1000 Code Status:   Code Status: Full Code Family Communication: plan of care discussed with patient at bedside. Patient status is: Remains hospitalized because of severity of illness Level of care: Med-Surg   Dispo: The patient is from: Home            Anticipated disposition: Anticipate discharge tomorrow if tolerating diet  Objective: Vitals last 24 hrs: Vitals:   03/11/23 2051 03/11/23 2141 03/12/23 0538 03/12/23 1122  BP: (!) 150/101 (!) 144/92 (!) 148/98 (!) 165/107  Pulse: (!) 105  (!) 102 (!) 102  Resp: 20  18 16   Temp: 98.2 F (36.8 C)  98.6 F (37 C) 98.8 F (37.1 C)  TempSrc:   Oral Oral  SpO2: 100%  100% 100%  Weight:      Height:       Weight change:   Physical Examination: General exam: alert awake appears chronically reviewed and HEENT:Oral mucosa moist, Ear/Nose WNL grossly Respiratory system: Bilaterally clear BS,no use of accessory muscle Cardiovascular system: S1 & S2 +, No JVD. Gastrointestinal system: Abdomen soft mild tenderness in the mid abdomen,ND,  BS+ Nervous System: Alert, awake, moving all extremities,and following commands. Extremities: LE edema neg,distal peripheral pulses palpable and warm.  Skin: No rashes,no icterus. MSK: Normal muscle bulk,tone, power   Medications reviewed:   Scheduled Meds:  enoxaparin (LOVENOX) injection  40 mg Subcutaneous Q24H   FLUoxetine  20 mg Oral q morning   hydrOXYzine  50 mg Oral q morning   insulin aspart  0-5 Units Subcutaneous QHS   insulin aspart  0-9 Units Subcutaneous TID WC   insulin aspart  10 Units Subcutaneous Once   insulin glargine-yfgn  20 Units Subcutaneous QHS   metoCLOPramide (REGLAN) injection  10 mg Intravenous Q8H   pantoprazole (PROTONIX) IV  40 mg Intravenous Q12H   potassium chloride  40 mEq Oral Q3H   sucralfate  1 g Oral TID WC & HS   Continuous Infusions:  lactated ringers 50 mL/hr at 03/11/23 1623   promethazine (PHENERGAN) injection (IM or IVPB) 6.25 mg (03/12/23 0102)      Diet Order             DIET SOFT Room service appropriate? Yes; Fluid consistency: Thin  Diet effective now                  Intake/Output Summary (Last 24 hours) at 03/12/2023 1205 Last data filed at 03/12/2023 0600 Gross per 24 hour  Intake 1602.17 ml  Output 300 ml  Net 1302.17 ml   Net IO Since Admission: 3,936.93 mL [03/12/23 1205]  Wt Readings from Last 3 Encounters:  03/08/23 56 kg  12/20/22 56 kg  12/15/22 58.4 kg     Unresulted Labs (From admission, onward)     Start     Ordered   03/10/23 1139  Opiate, quantitative, urine  Once,   R        03/10/23 1139   03/10/23 0500  Basic metabolic panel  Daily,   R     Question:  Specimen collection method  Answer:  IV Team=IV Team collect   03/09/23 0935   03/10/23 0500  CBC  Daily,   R     Question:  Specimen collection method  Answer:  IV Team=IV Team collect   03/09/23 0935          Data Reviewed: I have personally reviewed following labs and imaging studies CBC: Recent Labs  Lab 03/08/23 1534 03/09/23 0453 03/10/23 0506 03/11/23 0446 03/12/23 0427  WBC 12.5* 15.3* 10.5 6.5 6.3  HGB 13.4 12.6 12.0 12.5 13.3  HCT 39.6 39.7 36.8 38.4 39.9  MCV 88.8 95.0 89.5 91.6 88.9  PLT 263 237 243 244 284   Basic Metabolic Panel:  Recent Labs  Lab  03/08/23 1534 03/09/23 0453 03/10/23 0506 03/11/23 0446 03/12/23 0427  NA 138 141 135 137 135  K 4.3 3.4* 3.5 3.3* 3.2*  CL 109 107 108 103 101  CO2 18* 18* 19* 22 19*  GLUCOSE 236* 316* 262* 232* 133*  BUN 18 21* 17 13 8   CREATININE 0.80 1.01* 0.81 0.64 0.54  CALCIUM 8.9 9.2 8.5* 8.9 9.0  MG  --  1.9  --   --   --   PHOS  --  3.5  --   --   --    GFR: Estimated Creatinine Clearance: 82.7 mL/min (by C-G formula based on SCr of 0.54 mg/dL). Liver Function Tests:  Recent Labs  Lab 03/08/23 1534  AST 56*  ALT 34  ALKPHOS 95  BILITOT 1.1  PROT 7.7  ALBUMIN 4.1  Recent Labs  Lab 03/08/23 1534  LIPASE 19   No results for input(s): "HGBA1C" in the last 72 hours. Recent Labs  Lab 03/11/23 1204 03/11/23 1615 03/11/23 2053 03/12/23 0757 03/12/23 1119  GLUCAP 180* 242* 202* 147* 184*   Recent Labs  Lab 03/09/23 0453 03/09/23 1530  LATICACIDVEN 3.2* 2.4*   Recent Results (from the past 240 hours)  Resp panel by RT-PCR (RSV, Flu A&B, Covid) Anterior Nasal Swab     Status: None   Collection Time: 03/08/23  6:40 PM   Specimen: Anterior Nasal Swab  Result Value Ref Range Status   SARS Coronavirus 2 by RT PCR NEGATIVE NEGATIVE Final    Comment: (NOTE) SARS-CoV-2 target nucleic acids are NOT DETECTED.  The SARS-CoV-2 RNA is generally detectable in upper respiratory specimens during the acute phase of infection. The lowest concentration of SARS-CoV-2 viral copies this assay can detect is 138 copies/mL. A negative result does not preclude SARS-Cov-2 infection and should not be used as the sole basis for treatment or other patient management decisions. A negative result may occur with  improper specimen collection/handling, submission of specimen other than nasopharyngeal swab, presence of viral mutation(s) within the areas targeted by this assay, and inadequate number of viral copies(<138 copies/mL). A negative result must be combined with clinical observations,  patient history, and epidemiological information. The expected result is Negative.  Fact Sheet for Patients:  BloggerCourse.com  Fact Sheet for Healthcare Providers:  SeriousBroker.it  This test is no t yet approved or cleared by the [place] FDA and  has been authorized for detection and/or diagnosis of SARS-CoV-2 by FDA under an Emergency Use Authorization (EUA). This EUA will remain  in effect (meaning this test can be used) for the duration of the COVID-19 declaration under Section 564(b)(1) of the Act, 21 U.S.C.section 360bbb-3(b)(1), unless the authorization is terminated  or revoked sooner.       Influenza A by PCR NEGATIVE NEGATIVE Final   Influenza B by PCR NEGATIVE NEGATIVE Final    Comment: (NOTE) The Xpert Xpress SARS-CoV-2/FLU/RSV plus assay is intended as an aid in the diagnosis of influenza from Nasopharyngeal swab specimens and should not be used as a sole basis for treatment. Nasal washings and aspirates are unacceptable for Xpert Xpress SARS-CoV-2/FLU/RSV testing.  Fact Sheet for Patients: BloggerCourse.com  Fact Sheet for Healthcare Providers: SeriousBroker.it  This test is not yet approved or cleared by the [place] FDA and has been authorized for detection and/or diagnosis of SARS-CoV-2 by FDA under an Emergency Use Authorization (EUA). This EUA will remain in effect (meaning this test can be used) for the duration of the COVID-19 declaration under Section 564(b)(1) of the Act, 21 U.S.C. section 360bbb-3(b)(1), unless the authorization is terminated or revoked.     Resp Syncytial Virus by PCR NEGATIVE NEGATIVE Final    Comment: (NOTE) Fact Sheet for Patients: BloggerCourse.com  Fact Sheet for Healthcare Providers: SeriousBroker.it  This test is not yet approved or cleared by the [place] FDA and has been authorized for detection and/or diagnosis of SARS-CoV-2 by FDA under an Emergency Use Authorization (EUA). This EUA will remain in effect (meaning this test can be used) for the duration of the COVID-19 declaration under Section 564(b)(1) of the Act, 21 U.S.C. section 360bbb-3(b)(1), unless the authorization is terminated or revoked.  Performed at [facility], [address] [place], [place] [address]     Antimicrobials/Microbiology: Anti-infectives (From admission, onward)    None  Component Value Date/Time   SDES  12/11/2022 1015    URINE, CLEAN CATCH Performed at [facility], [address], [place], [place] [address]    SPECREQUEST  12/11/2022 1015    NONE Performed at [facility], [address] [place], [place] [address]    CULT (A) 12/11/2022 1015    <10,000 COLONIES/mL INSIGNIFICANT GROWTH Performed at [facility], [address], [place], [place] [address]    REPTSTATUS 12/12/2022 FINAL 12/11/2022 1015     Radiology Studies: No results found.    LOS: 3 days   Total time spent in review of labs and imaging, patient evaluation, formulation of plan, documentation and communication with family: 35 minutes  Lanae Boast, MD  [facility]  03/12/2023, 12:05 PM

## 2023-03-12 NOTE — Inpatient Diabetes Management (Signed)
Inpatient Diabetes Program Recommendations  AACE/ADA: New Consensus Statement on Inpatient Glycemic Control (2015)  Target Ranges:  Prepandial:   less than 140 mg/dL      Peak postprandial:   less than 180 mg/dL (1-2 hours)      Critically ill patients:  140 - 180 mg/dL    Latest Reference Range & Units 03/11/23 08:18 03/11/23 12:04 03/11/23 16:15 03/11/23 20:53  Glucose-Capillary 70 - 99 mg/dL 045 (H)  2 units Novolog  180 (H)  2 units Novolog  242 (H)  2 units Novolog  202 (H)  2 units Novolog  20 units Semglee  (H): Data is abnormally high  Latest Reference Range & Units 03/12/23 07:57 03/12/23 11:19  Glucose-Capillary 70 - 99 mg/dL 409 (H)  1 units Novolog  184 (H)  3 units Novolog   (H): Data is abnormally high     History: Type 1 diabetes  Home DM Meds: Tandem insulin pump with the Dexcom G7 ENDO: Dr. Herbie Drape visit was 04/22/22 Tandem Insulin pump BASAL 0.95 (22.8 total) BOLUS Insulin: Carb ratio 1 units/9 carbs--ISF 40 Dexcom G7 CGM  Current Orders: Semglee 20 units at HS     Novolog Sensitive Correction Scale/ SSI (0-9 units) TID AC + HS    MD- Please consider adding Novolog 2 units TID with meals for meal coverage HOLD if pt NPO HOLD if pt eats <50% meals   --Will follow patient during hospitalization--  Ambrose Finland RN, MSN, CDCES Diabetes Coordinator Inpatient Glycemic Control Team Team Pager: [phone] (8a-5p)

## 2023-03-12 NOTE — Plan of Care (Signed)

## 2023-03-12 NOTE — Plan of Care (Signed)
  Problem: Metabolic: Goal: Ability to maintain appropriate glucose levels will improve Outcome: Progressing   Problem: Clinical Measurements: Goal: Diagnostic test results will improve Outcome: Progressing Goal: Respiratory complications will improve Outcome: Progressing Goal: Cardiovascular complication will be avoided Outcome: Progressing   Problem: Coping: Goal: Level of anxiety will decrease Outcome: Progressing   Problem: Pain Managment: Goal: General experience of comfort will improve and/or be controlled Outcome: Progressing

## 2023-03-13 DIAGNOSIS — K3184 Gastroparesis: Secondary | ICD-10-CM | POA: Diagnosis not present

## 2023-03-13 LAB — LIPASE, BLOOD: Lipase: 41 U/L (ref 11–51)

## 2023-03-13 LAB — GLUCOSE, CAPILLARY
Glucose-Capillary: 214 mg/dL — ABNORMAL HIGH (ref 70–99)
Glucose-Capillary: 194 mg/dL — ABNORMAL HIGH (ref 70–99)
Glucose-Capillary: 204 mg/dL — ABNORMAL HIGH (ref 70–99)
Glucose-Capillary: 158 mg/dL — ABNORMAL HIGH (ref 70–99)

## 2023-03-13 LAB — BASIC METABOLIC PANEL
Anion gap: 16 — ABNORMAL HIGH (ref 5–15)
BUN: 10 mg/dL (ref 6–20)
CO2: 18 mmol/L — ABNORMAL LOW (ref 22–32)
Calcium: 9.1 mg/dL (ref 8.9–10.3)
Chloride: 100 mmol/L (ref 98–111)
Creatinine, Ser: 0.65 mg/dL (ref 0.44–1.00)
GFR, Estimated: >60 mL/min (ref >60)
Glucose, Bld: 211 mg/dL — ABNORMAL HIGH (ref 70–99)
Potassium: 3.2 mmol/L — ABNORMAL LOW (ref 3.5–5.1)
Sodium: 134 mmol/L — ABNORMAL LOW (ref 135–145)

## 2023-03-13 LAB — CBC
HCT: 42.6 % (ref 36.0–46.0)
Hemoglobin: 14.6 g/dL (ref 12.0–15.0)
MCH: 29.4 pg (ref 26.0–34.0)
MCHC: 34.3 g/dL (ref 30.0–36.0)
MCV: 85.7 fL (ref 80.0–100.0)
Platelets: 311 10*3/uL (ref 150–400)
RBC: 4.97 MIL/uL (ref 3.87–5.11)
RDW: 12.2 % (ref 11.5–15.5)
WBC: 7.1 10*3/uL (ref 4.0–10.5)
nRBC: 0 % (ref 0.0–0.2)

## 2023-03-13 MED ORDER — METOCLOPRAMIDE HCL 5 MG/ML IJ SOLN
5.0000 mg | Freq: Three times a day (TID) | INTRAMUSCULAR | Status: DC
  Administered 2023-03-13 – 2023-03-14 (×3): 5 mg via INTRAVENOUS
  Filled 2023-03-13 (×3): qty 2

## 2023-03-13 MED ORDER — LACTATED RINGERS IV SOLN: INTRAVENOUS | Status: AC

## 2023-03-13 MED ORDER — POTASSIUM CHLORIDE CRYS ER 20 MEQ PO TBCR
40.0000 meq | EXTENDED_RELEASE_TABLET | ORAL | Status: AC
  Administered 2023-03-13 (×2): 40 meq via ORAL
  Filled 2023-03-13 (×2): qty 2

## 2023-03-13 NOTE — Plan of Care (Signed)

## 2023-03-13 NOTE — Progress Notes (Signed)
 PROGRESS NOTE [name]  AVW:[id number] DOB: 10-Nov-1990 DOA: 03/08/2023 PCP: Elizabeth Palau, FNP  Brief Narrative/Hospital Course:  33 y.o. 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 cyclical vomiting, HTN, hepatic steatosis, uterine fibroid, asthma, mood d/o, THC use who presents with acute onset of N/V this AM. States this is similar to gastroparesis, unclear what caused it to worsen. She is typically on insulin pump but this is currently off, she does not know her off-pump insulin dosing. Using sliding scale. Denies any hematemesis. Per ED had also had diarrhea. She is writhing on the bed and asking for morphine or dilaudid. In the ED vitals are stable besides mild tachycardia afebrile.  Labs with hyperglycemia lactic acidosis, leukocytosis COVID RSV negative, pregnancy hCG serum negative.  Managing IV fluid hydration and DIET ADAT  Subjective:  Patient reports she has been doing broth but vomited last night and this morning but overall she feels she is getting better Afebrile overnight BP 140s-160s She has been ambulating having bowel movement last 2/20  Assessment and Plan: Principal Problem:   Gastroparesis Active Problems:   Hypovolemia   Nausea and vomiting   Intractable nausea vomiting diarrhea Question gastroparesis w/ cyclical vomiting Hypovolemia w/ ketonuria Esophagitis: Patient presenting with acute onset nausea vomiting abdominal pain.  Workup with normal LFTs lipase and normal CT abdomen pelvis with contrast-except stable distal esophageal thickening.  Complaints of vomiting on broth, does not feel ready to go home yet.  Having bowel movement, her abdomen is soft and benign on exam.  BMP shows anion gap slightly elevated 16 bicarb 18  We will continue IV fluid hydration, as needed morphine for pain management and Phenergan, Reglan (decrease dose to 5, check EKG for QTc) . Cot IV PPI, carafate.Advance diet as tolerated.  Non-anion gap  acidosis Lactic acidosis Hypokalemia: Will continue to replace electrolytes.  Continue IV hydration.     Question of withdrawal syndrome : UDS positive for  opiate which she has been getting  Type 1 diabetes control hyperglycemia: On insulin pump DM coordinator following treating her long-acting insulin -20 units bedtime, cont ssi  she will resume her home insulin pump 24 hours post long-acting insulin dose.  Blood sugar remains fairly controlled as below. Recent Labs  Lab 03/12/23 1119 03/12/23 1654 03/12/23 2032 03/12/23 2130 03/13/23 0736  GLUCAP 184* 192* 139* 160* 214*      Hypertension: BP stable. no recent fill of antihypertensives  Asthma: Not wheezing.  Cont prn albuterol  Mood disorder: Unclear if she is still taking bupropion/fluoxetine  DVT prophylaxis: enoxaparin (LOVENOX) injection 40 mg Start: 03/09/23 1000 Code Status:   Code Status: Full Code Family Communication: plan of care discussed with patient at bedside. Patient status is: Remains hospitalized because of severity of illness Level of care: Med-Surg   Dispo: The patient is from: Home            Anticipated disposition: Will discharge once tolerating diet   Objective: Vitals last 24 hrs: Vitals:   03/12/23 0538 03/12/23 1122 03/12/23 2010 03/13/23 0433  BP: (!) 148/98 (!) 165/107 (!) 160/105 (!) 148/98  Pulse: (!) 102 (!) 102 93 97  Resp: 18 16 18 18   Temp: 98.6 F (37 C) 98.8 F (37.1 C) 97.9 F (36.6 C) 98.8 F (37.1 C)  TempSrc: Oral Oral Oral Oral  SpO2: 100% 100% 100% 100%  Weight:      Height:       Weight change:   Physical Examination: General exam:  alert awake, oriented at baseline, appear older than stated age HEENT:Oral mucosa moist, Ear/Nose WNL grossly Respiratory system: Bilaterally clear BS,no use of accessory muscle Cardiovascular system: S1 & S2 +, No JVD. Gastrointestinal system: Abdomen soft, mildly tender centrally no guarding rigidity,ND, BS+ Nervous System:  Alert, awake, moving all extremities,and following commands. Extremities: LE edema neg,distal peripheral pulses palpable and warm.  Skin: No rashes,no icterus. MSK: Normal muscle bulk,tone, power   Medications reviewed:  Scheduled Meds:  enoxaparin (LOVENOX) injection  40 mg Subcutaneous Q24H   FLUoxetine  20 mg Oral q morning   hydrOXYzine  50 mg Oral q morning   insulin aspart  0-5 Units Subcutaneous QHS   insulin aspart  0-9 Units Subcutaneous TID WC   insulin aspart  10 Units Subcutaneous Once   insulin glargine-yfgn  20 Units Subcutaneous QHS   metoCLOPramide (REGLAN) injection  10 mg Intravenous Q8H   pantoprazole (PROTONIX) IV  40 mg Intravenous Q12H   potassium chloride  40 mEq Oral Q3H   sucralfate  1 g Oral TID WC & HS   Continuous Infusions:  lactated ringers     promethazine (PHENERGAN) injection (IM or IVPB) 6.25 mg (03/13/23 0618)      Diet Order             DIET SOFT Room service appropriate? Yes; Fluid consistency: Thin  Diet effective now                  Intake/Output Summary (Last 24 hours) at 03/13/2023 1017 Last data filed at 03/12/2023 1600 Gross per 24 hour  Intake 743.76 ml  Output --  Net 743.76 ml   Net IO Since Admission: 4,680.69 mL [03/13/23 1017]  Wt Readings from Last 3 Encounters:  03/08/23 56 kg  12/20/22 56 kg  12/15/22 58.4 kg     Unresulted Labs (From admission, onward)     Start     Ordered   03/10/23 1139  Opiate, quantitative, urine  Once,   R        03/10/23 1139   03/10/23 0500  Basic metabolic panel  Daily,   R     Question:  Specimen collection method  Answer:  IV Team=IV Team collect   03/09/23 0935   03/10/23 0500  CBC  Daily,   R     Question:  Specimen collection method  Answer:  IV Team=IV Team collect   03/09/23 0935          Data Reviewed: I have personally reviewed following labs and imaging studies CBC: Recent Labs  Lab 03/09/23 0453 03/10/23 0506 03/11/23 0446 03/12/23 0427 03/13/23 0525  WBC  15.3* 10.5 6.5 6.3 7.1  HGB 12.6 12.0 12.5 13.3 14.6  HCT 39.7 36.8 38.4 39.9 42.6  MCV 95.0 89.5 91.6 88.9 85.7  PLT 237 243 244 284 311   Basic Metabolic Panel:  Recent Labs  Lab 03/09/23 0453 03/10/23 0506 03/11/23 0446 03/12/23 0427 03/13/23 0525  NA 141 135 137 135 134*  K 3.4* 3.5 3.3* 3.2* 3.2*  CL 107 108 103 101 100  CO2 18* 19* 22 19* 18*  GLUCOSE 316* 262* 232* 133* 211*  BUN 21* 17 13 8 10   CREATININE 1.01* 0.81 0.64 0.54 0.65  CALCIUM 9.2 8.5* 8.9 9.0 9.1  MG 1.9  --   --   --   --   PHOS 3.5  --   --   --   --    GFR: Estimated Creatinine Clearance:  82.7 mL/min (by C-G formula based on SCr of 0.65 mg/dL). Liver Function Tests:  Recent Labs  Lab 03/08/23 1534  AST 56*  ALT 34  ALKPHOS 95  BILITOT 1.1  PROT 7.7  ALBUMIN 4.1   Recent Labs  Lab 03/08/23 1534  LIPASE 19   No results for input(s): "HGBA1C" in the last 72 hours. Recent Labs  Lab 03/12/23 1119 03/12/23 1654 03/12/23 2032 03/12/23 2130 03/13/23 0736  GLUCAP 184* 192* 139* 160* 214*   Recent Labs  Lab 03/09/23 0453 03/09/23 1530  LATICACIDVEN 3.2* 2.4*   Recent Results (from the past 240 hours)  Resp panel by RT-PCR (RSV, Flu A&B, Covid) Anterior Nasal Swab     Status: None   Collection Time: 03/08/23  6:40 PM   Specimen: Anterior Nasal Swab  Result Value Ref Range Status   SARS Coronavirus 2 by RT PCR NEGATIVE NEGATIVE Final    Comment: (NOTE) SARS-CoV-2 target nucleic acids are NOT DETECTED.  The SARS-CoV-2 RNA is generally detectable in upper respiratory specimens during the acute phase of infection. The lowest concentration of SARS-CoV-2 viral copies this assay can detect is 138 copies/mL. A negative result does not preclude SARS-Cov-2 infection and should not be used as the sole basis for treatment or other patient management decisions. A negative result may occur with  improper specimen collection/handling, submission of specimen other than nasopharyngeal swab,  presence of viral mutation(s) within the areas targeted by this assay, and inadequate number of viral copies(<138 copies/mL). A negative result must be combined with clinical observations, patient history, and epidemiological information. The expected result is Negative.  Fact Sheet for Patients:  BloggerCourse.com  Fact Sheet for Healthcare Providers:  SeriousBroker.it  This test is no t yet approved or cleared by the [place] FDA and  has been authorized for detection and/or diagnosis of SARS-CoV-2 by FDA under an Emergency Use Authorization (EUA). This EUA will remain  in effect (meaning this test can be used) for the duration of the COVID-19 declaration under Section 564(b)(1) of the Act, 21 U.S.C.section 360bbb-3(b)(1), unless the authorization is terminated  or revoked sooner.       Influenza A by PCR NEGATIVE NEGATIVE Final   Influenza B by PCR NEGATIVE NEGATIVE Final    Comment: (NOTE) The Xpert Xpress SARS-CoV-2/FLU/RSV plus assay is intended as an aid in the diagnosis of influenza from Nasopharyngeal swab specimens and should not be used as a sole basis for treatment. Nasal washings and aspirates are unacceptable for Xpert Xpress SARS-CoV-2/FLU/RSV testing.  Fact Sheet for Patients: BloggerCourse.com  Fact Sheet for Healthcare Providers: SeriousBroker.it  This test is not yet approved or cleared by the [place] FDA and has been authorized for detection and/or diagnosis of SARS-CoV-2 by FDA under an Emergency Use Authorization (EUA). This EUA will remain in effect (meaning this test can be used) for the duration of the COVID-19 declaration under Section 564(b)(1) of the Act, 21 U.S.C. section 360bbb-3(b)(1), unless the authorization is terminated or revoked.     Resp Syncytial Virus by PCR NEGATIVE NEGATIVE Final    Comment: (NOTE) Fact Sheet for  Patients: BloggerCourse.com  Fact Sheet for Healthcare Providers: SeriousBroker.it  This test is not yet approved or cleared by the [place] FDA and has been authorized for detection and/or diagnosis of SARS-CoV-2 by FDA under an Emergency Use Authorization (EUA). This EUA will remain in effect (meaning this test can be used) for the duration of the COVID-19 declaration under Section 564(b)(1) of the  Act, 21 U.S.C. section 360bbb-3(b)(1), unless the authorization is terminated or revoked.  Performed at [facility], [address], [place], [place] [address]     Antimicrobials/Microbiology: Anti-infectives (From admission, onward)    None         Component Value Date/Time   SDES  12/11/2022 1015    URINE, CLEAN CATCH Performed at [facility], [address], [place], [place] [address]    SPECREQUEST  12/11/2022 1015    NONE Performed at [facility], [address], [place], [place] [address]    CULT (A) 12/11/2022 1015    <10,000 COLONIES/mL INSIGNIFICANT GROWTH Performed at [facility], [address] [place], [place] [address]    REPTSTATUS 12/12/2022 FINAL 12/11/2022 1015     Radiology Studies: No results found.    LOS: 4 days   Total time spent in review of labs and imaging, patient evaluation, formulation of plan, documentation and communication with family: 35 minutes  Lanae Boast, MD  [facility]  03/13/2023, 10:17 AM

## 2023-03-14 DIAGNOSIS — K3184 Gastroparesis: Secondary | ICD-10-CM | POA: Diagnosis not present

## 2023-03-14 LAB — COMPREHENSIVE METABOLIC PANEL
ALT: 18 U/L (ref 0–44)
AST: 11 U/L — ABNORMAL LOW (ref 15–41)
Albumin: 3.1 g/dL — ABNORMAL LOW (ref 3.5–5.0)
Alkaline Phosphatase: 65 U/L (ref 38–126)
Anion gap: 7 (ref 5–15)
BUN: 11 mg/dL (ref 6–20)
CO2: 23 mmol/L (ref 22–32)
Calcium: 8.7 mg/dL — ABNORMAL LOW (ref 8.9–10.3)
Chloride: 103 mmol/L (ref 98–111)
Creatinine, Ser: 0.63 mg/dL (ref 0.44–1.00)
GFR, Estimated: >60 mL/min (ref >60)
Glucose, Bld: 264 mg/dL — ABNORMAL HIGH (ref 70–99)
Potassium: 3.2 mmol/L — ABNORMAL LOW (ref 3.5–5.1)
Sodium: 133 mmol/L — ABNORMAL LOW (ref 135–145)
Total Bilirubin: 0.6 mg/dL (ref 0.0–1.2)
Total Protein: 6 g/dL — ABNORMAL LOW (ref 6.5–8.1)

## 2023-03-14 LAB — GLUCOSE, CAPILLARY
Glucose-Capillary: 231 mg/dL — ABNORMAL HIGH (ref 70–99)
Glucose-Capillary: 332 mg/dL — ABNORMAL HIGH (ref 70–99)

## 2023-03-14 MED ORDER — POTASSIUM CHLORIDE CRYS ER 20 MEQ PO TBCR
40.0000 meq | EXTENDED_RELEASE_TABLET | Freq: Once | ORAL | Status: AC
  Administered 2023-03-14: 40 meq via ORAL
  Filled 2023-03-14: qty 2

## 2023-03-14 MED ORDER — POTASSIUM CHLORIDE 10 MEQ/100ML IV SOLN
10.0000 meq | INTRAVENOUS | Status: AC
  Administered 2023-03-14 (×4): 10 meq via INTRAVENOUS
  Filled 2023-03-14 (×4): qty 100

## 2023-03-14 NOTE — Discharge Instructions (Addendum)
 You may restart your insulin pump 24hrs after your last long acting insulin ( at 9 pm tonight, 03/14/2023)

## 2023-03-14 NOTE — Plan of Care (Signed)

## 2023-03-14 NOTE — Discharge Summary (Signed)
 Physician Discharge Summary  [name] ION:[id number] DOB: October 14, 1990 DOA: 03/08/2023  PCP: Elizabeth Palau, FNP  Admit date: 03/08/2023 Discharge date: 03/14/2023 Recommendations for Outpatient Follow-up:  Follow up with PCP in 1 weeks-call for appointment Please obtain BMP/CBC in one week  Discharge Dispo: Home to [facility] Discharge Condition: Stable Code Status:   Code Status: Full Code Diet recommendation:  Diet Order             DIET SOFT Room service appropriate? Yes; Fluid consistency: Thin  Diet effective now                    Brief/Interim Summary:  33 y.o. 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 cyclical vomiting, HTN, hepatic steatosis, uterine fibroid, asthma, mood d/o, THC use who presents with acute onset of N/V this AM. States this is similar to gastroparesis, unclear what caused it to worsen. She is typically on insulin pump but this is currently off, she does not know her off-pump insulin dosing. Using sliding scale. Denies any hematemesis. Per ED had also had diarrhea. She is writhing on the bed and asking for morphine or dilaudid. In the ED vitals are stable besides mild tachycardia afebrile.  Labs with hyperglycemia lactic acidosis, leukocytosis COVID RSV negative, pregnancy hCG serum negative.  Managing IV fluid hydration and DIET ADAT Continued on Reglan scheduled along with as needed pain medication Phenergan-QTc monitored stable at 428 on 2/22 At this time patient feels improved. She is tolerating diet she is requesting for discharge home today     Discharge Diagnoses:  Principal Problem:   Gastroparesis Active Problems:   Hypovolemia   Nausea and vomiting  Intractable nausea vomiting diarrhea Question gastroparesis w/ cyclical vomiting Hypovolemia w/ ketonuria Esophagitis: Patient presenting with acute onset nausea vomiting abdominal pain.  Workup with normal LFTs lipase and normal CT abdomen pelvis with contrast-except  stable distal esophageal thickening.  After several days of antiemetics pain management, antiemetics.  Patient has clinically improved.  She is requesting for discharge home today she will continue Phenergan PPI   Non-anion gap acidosis Lactic acidosis Hypokalemia: Potassium replaced. Replaced this morning prior to discharge    Question of withdrawal syndrome : UDS positive for  opiate    Type 1 diabetes control hyperglycemia: On insulin pump DM coordinator following treating her long-acting insulin -20 units bedtime, cont ssi  she will resume her home insulin pump 24 hours post long-acting insulin dose.  Blood sugar remains fairly controlled as below.  Hypertension: BP stable. no recent fill of antihypertensives   Asthma: Not wheezing.  Cont prn albuterol   Mood disorder: Unclear if she is still taking bupropion/fluoxetine  Consults: None Subjective: Aaox3 wants to go home today  Discharge Exam: Vitals:   03/13/23 1940 03/14/23 0444  BP: (!) 122/94 115/86  Pulse: 99 83  Resp: 17 17  Temp: 98 F (36.7 C) 98.2 F (36.8 C)  SpO2: 100% 100%   General: Pt is alert, awake, not in acute distress Cardiovascular: RRR, S1/S2 +, no rubs, no gallops Respiratory: CTA bilaterally, no wheezing, no rhonchi Abdominal: Soft, NT, ND, bowel sounds + Extremities: no edema, no cyanosis  Discharge Instructions  Discharge Instructions     Discharge instructions   Complete by: As directed    Please call call MD or return to ER for similar or worsening recurring problem that brought you to hospital or if any fever,nausea/vomiting,abdominal pain, uncontrolled pain, chest pain,  shortness of breath or any  other alarming symptoms.  Please follow-up your doctor as instructed in a week time and call the office for appointment.  Please avoid alcohol, smoking, or any other illicit substance and maintain healthy habits including taking your regular medications as prescribed.  You were cared  for by a hospitalist during your hospital stay. If you have any questions about your discharge medications or the care you received while you were in the hospital after you are discharged, you can call the unit and ask to speak with the hospitalist on call if the hospitalist that took care of you is not available.  Once you are discharged, your primary care physician will handle any further medical issues. Please note that NO REFILLS for any discharge medications will be authorized once you are discharged, as it is imperative that you return to your primary care physician (or establish a relationship with a primary care physician if you do not have one) for your aftercare needs so that they can reassess your need for medications and monitor your lab values   Increase activity slowly   Complete by: As directed       Allergies as of 03/14/2023       Reactions   Peanut-containing Drug Products Swelling, Other (See Comments)   Reaction:  Swelling of mouth and lips    Food Swelling, Other (See Comments)   Pt is allergic to strawberries.   Reaction:  Swelling of mouth and lips    Ultram [tramadol] Itching        Medication List     TAKE these medications    acetaminophen 325 MG tablet Commonly known as: TYLENOL Take 2 tablets (650 mg total) by mouth every 6 (six) hours as needed for mild pain (pain score 1-3) (or Fever >/= 101).   albuterol 108 (90 Base) MCG/ACT inhaler Commonly known as: VENTOLIN HFA Inhale 1-2 puffs into the lungs every 6 (six) hours as needed for wheezing or shortness of breath.   blood glucose meter kit and supplies Dispense based on patient and insurance preference. Use up to four times daily as directed. (FOR ICD-10 E10.9, E11.9).   FLUoxetine 20 MG capsule Commonly known as: PROZAC Take 20 mg by mouth every morning.   hydrOXYzine 50 MG tablet Commonly known as: ATARAX Take 50 mg by mouth every morning.   insulin aspart 100 UNIT/ML injection Commonly  known as: novoLOG Inject 50 Units into the skin daily. VIA PUMP   Insulin Pen Needle 31G X 5 MM Misc 30 Units by Does not apply route at bedtime.   lisinopril 2.5 MG tablet Commonly known as: ZESTRIL Take 2.5 mg by mouth daily.   metoCLOPramide 10 MG tablet Commonly known as: REGLAN Take 1 tablet (10 mg total) by mouth every 8 (eight) hours as needed for up to 15 doses for nausea.   pantoprazole 40 MG tablet Commonly known as: Protonix Take 1 tablet (40 mg total) by mouth daily.   promethazine 25 MG tablet Commonly known as: PHENERGAN Take 1 tablet (25 mg total) by mouth every 6 (six) hours as needed for nausea or vomiting.   sucralfate 1 GM/10ML suspension Commonly known as: CARAFATE Take 10 mLs (1 g total) by mouth 4 (four) times daily -  with meals and at bedtime.        Follow-up Information     Elizabeth Palau, FNP Follow up in 1 week(s).   Specialty: Nurse Practitioner Contact information: [address] [place] [address] [phone]  Allergies  Allergen Reactions   Peanut-Containing Drug Products Swelling and Other (See Comments)    Reaction:  Swelling of mouth and lips    Food Swelling and Other (See Comments)    Pt is allergic to strawberries.   Reaction:  Swelling of mouth and lips    Ultram [Tramadol] Itching    The results of significant diagnostics from this hospitalization (including imaging, microbiology, ancillary and laboratory) are listed below for reference.    Microbiology: Recent Results (from the past 240 hours)  Resp panel by RT-PCR (RSV, Flu A&B, Covid) Anterior Nasal Swab     Status: None   Collection Time: 03/08/23  6:40 PM   Specimen: Anterior Nasal Swab  Result Value Ref Range Status   SARS Coronavirus 2 by RT PCR NEGATIVE NEGATIVE Final    Comment: (NOTE) SARS-CoV-2 target nucleic acids are NOT DETECTED.  The SARS-CoV-2 RNA is generally detectable in upper respiratory specimens  during the acute phase of infection. The lowest concentration of SARS-CoV-2 viral copies this assay can detect is 138 copies/mL. A negative result does not preclude SARS-Cov-2 infection and should not be used as the sole basis for treatment or other patient management decisions. A negative result may occur with  improper specimen collection/handling, submission of specimen other than nasopharyngeal swab, presence of viral mutation(s) within the areas targeted by this assay, and inadequate number of viral copies(<138 copies/mL). A negative result must be combined with clinical observations, patient history, and epidemiological information. The expected result is Negative.  Fact Sheet for Patients:  BloggerCourse.com  Fact Sheet for Healthcare Providers:  SeriousBroker.it  This test is no t yet approved or cleared by the [place] FDA and  has been authorized for detection and/or diagnosis of SARS-CoV-2 by FDA under an Emergency Use Authorization (EUA). This EUA will remain  in effect (meaning this test can be used) for the duration of the COVID-19 declaration under Section 564(b)(1) of the Act, 21 U.S.C.section 360bbb-3(b)(1), unless the authorization is terminated  or revoked sooner.       Influenza A by PCR NEGATIVE NEGATIVE Final   Influenza B by PCR NEGATIVE NEGATIVE Final    Comment: (NOTE) The Xpert Xpress SARS-CoV-2/FLU/RSV plus assay is intended as an aid in the diagnosis of influenza from Nasopharyngeal swab specimens and should not be used as a sole basis for treatment. Nasal washings and aspirates are unacceptable for Xpert Xpress SARS-CoV-2/FLU/RSV testing.  Fact Sheet for Patients: BloggerCourse.com  Fact Sheet for Healthcare Providers: SeriousBroker.it  This test is not yet approved or cleared by the [place] FDA and has been authorized for detection  and/or diagnosis of SARS-CoV-2 by FDA under an Emergency Use Authorization (EUA). This EUA will remain in effect (meaning this test can be used) for the duration of the COVID-19 declaration under Section 564(b)(1) of the Act, 21 U.S.C. section 360bbb-3(b)(1), unless the authorization is terminated or revoked.     Resp Syncytial Virus by PCR NEGATIVE NEGATIVE Final    Comment: (NOTE) Fact Sheet for Patients: BloggerCourse.com  Fact Sheet for Healthcare Providers: SeriousBroker.it  This test is not yet approved or cleared by the [place] FDA and has been authorized for detection and/or diagnosis of SARS-CoV-2 by FDA under an Emergency Use Authorization (EUA). This EUA will remain in effect (meaning this test can be used) for the duration of the COVID-19 declaration under Section 564(b)(1) of the Act, 21 U.S.C. section 360bbb-3(b)(1), unless the authorization is terminated or revoked.  Performed at [facility], [address] [place], [place] [address]     Procedures/Studies: CT ABDOMEN PELVIS W CONTRAST Result Date: 03/09/2023 CLINICAL DATA:  Abdominal pain and vomiting, history of gastroparesis EXAM: CT ABDOMEN AND PELVIS WITH CONTRAST TECHNIQUE: Multidetector CT imaging of the abdomen and pelvis was performed using the standard protocol following bolus administration of intravenous contrast. RADIATION DOSE REDUCTION: This exam was performed according to the departmental dose-optimization program which includes automated exposure control, adjustment of the mA and/or kV according to patient size and/or use of iterative reconstruction technique. CONTRAST:  80mL OMNIPAQUE IOHEXOL 300 MG/ML  SOLN COMPARISON:  12/15/2022 FINDINGS: Lower chest: No acute abnormality. Hepatobiliary: No focal liver abnormality is seen. Status post cholecystectomy. No biliary dilatation. Pancreas: Unremarkable. No pancreatic ductal  dilatation or surrounding inflammatory changes. Spleen: Normal in size without focal abnormality. Adrenals/Urinary Tract: Adrenal glands are within normal limits. Kidneys demonstrate a normal enhancement pattern bilaterally. No renal calculi or obstructive changes are seen. The bladder is well distended. Stomach/Bowel: No obstructive or inflammatory changes of the colon are seen. The appendix is not well visualized. No inflammatory changes to suggest appendicitis are noted. Small bowel and stomach are within normal limits. Stable thickening of the distal esophagus is noted unchanged from the prior exam. Vascular/Lymphatic: No significant vascular findings are present. No enlarged abdominal or pelvic lymph nodes. Reproductive: Uterus is well visualized. Rounded hyperdensity is noted measuring up to 3.9 cm consistent with uterine fibroid. This is stable from the prior exam. Mass is seen. Other: No abdominal wall hernia or abnormality. No abdominopelvic ascites. Musculoskeletal: No acute or significant osseous findings. IMPRESSION: Stable leiomyoma. Stable distal esophageal thickening. No acute abnormality to correspond with the given clinical history is noted. Electronically Signed   By: Alcide Clever M.D.   On: 03/09/2023 01:47    Labs: BNP (last 3 results) No results for input(s): "BNP" in the last 8760 hours. Basic Metabolic Panel: Recent Labs  Lab 03/09/23 0453 03/10/23 0506 03/11/23 0446 03/12/23 0427 03/13/23 0525 03/14/23 0439  NA 141 135 137 135 134* 133*  K 3.4* 3.5 3.3* 3.2* 3.2* 3.2*  CL 107 108 103 101 100 103  CO2 18* 19* 22 19* 18* 23  GLUCOSE 316* 262* 232* 133* 211* 264*  BUN 21* 17 13 8 10 11   CREATININE 1.01* 0.81 0.64 0.54 0.65 0.63  CALCIUM 9.2 8.5* 8.9 9.0 9.1 8.7*  MG 1.9  --   --   --   --   --   PHOS 3.5  --   --   --   --   --    Liver Function Tests: Recent Labs  Lab 03/08/23 1534 03/14/23 0439  AST 56* 11*  ALT 34 18  ALKPHOS 95 65  BILITOT 1.1 0.6  PROT 7.7  6.0*  ALBUMIN 4.1 3.1*   Recent Labs  Lab 03/08/23 1534 03/13/23 0525  LIPASE 19 41   No results for input(s): "AMMONIA" in the last 168 hours. CBC: Recent Labs  Lab 03/09/23 0453 03/10/23 0506 03/11/23 0446 03/12/23 0427 03/13/23 0525  WBC 15.3* 10.5 6.5 6.3 7.1  HGB 12.6 12.0 12.5 13.3 14.6  HCT 39.7 36.8 38.4 39.9 42.6  MCV 95.0 89.5 91.6 88.9 85.7  PLT 237 243 244 284 311   Cardiac Enzymes: No results for input(s): "CKTOTAL", "CKMB", "CKMBINDEX", "TROPONINI" in the last 168 hours. BNP: Invalid input(s): "POCBNP" CBG: Recent Labs  Lab 03/13/23 1143 03/13/23 1637 03/13/23 2102 03/14/23 0732 03/14/23 1112  GLUCAP 194* 204*  158* 231* 332*   D-Dimer No results for input(s): "DDIMER" in the last 72 hours. Hgb A1c No results for input(s): "HGBA1C" in the last 72 hours. Lipid Profile No results for input(s): "CHOL", "HDL", "LDLCALC", "TRIG", "CHOLHDL", "LDLDIRECT" in the last 72 hours. Thyroid function studies No results for input(s): "TSH", "T4TOTAL", "T3FREE", "THYROIDAB" in the last 72 hours.  Invalid input(s): "FREET3" Anemia work up No results for input(s): "VITAMINB12", "FOLATE", "FERRITIN", "TIBC", "IRON", "RETICCTPCT" in the last 72 hours. Urinalysis    Component Value Date/Time   COLORURINE YELLOW 03/10/2023 1140   APPEARANCEUR HAZY (A) 03/10/2023 1140   LABSPEC 1.025 03/10/2023 1140   PHURINE 6.0 03/10/2023 1140   GLUCOSEU >=500 (A) 03/10/2023 1140   GLUCOSEU >=1000 11/07/2012 1205   HGBUR SMALL (A) 03/10/2023 1140   BILIRUBINUR NEGATIVE 03/10/2023 1140   KETONESUR 80 (A) 03/10/2023 1140   PROTEINUR 30 (A) 03/10/2023 1140   UROBILINOGEN 0.2 11/24/2014 1045   NITRITE NEGATIVE 03/10/2023 1140   LEUKOCYTESUR SMALL (A) 03/10/2023 1140   Sepsis Labs Recent Labs  Lab 03/10/23 0506 03/11/23 0446 03/12/23 0427 03/13/23 0525  WBC 10.5 6.5 6.3 7.1   Microbiology Recent Results (from the past 240 hours)  Resp panel by RT-PCR (RSV, Flu A&B,  Covid) Anterior Nasal Swab     Status: None   Collection Time: 03/08/23  6:40 PM   Specimen: Anterior Nasal Swab  Result Value Ref Range Status   SARS Coronavirus 2 by RT PCR NEGATIVE NEGATIVE Final    Comment: (NOTE) SARS-CoV-2 target nucleic acids are NOT DETECTED.  The SARS-CoV-2 RNA is generally detectable in upper respiratory specimens during the acute phase of infection. The lowest concentration of SARS-CoV-2 viral copies this assay can detect is 138 copies/mL. A negative result does not preclude SARS-Cov-2 infection and should not be used as the sole basis for treatment or other patient management decisions. A negative result may occur with  improper specimen collection/handling, submission of specimen other than nasopharyngeal swab, presence of viral mutation(s) within the areas targeted by this assay, and inadequate number of viral copies(<138 copies/mL). A negative result must be combined with clinical observations, patient history, and epidemiological information. The expected result is Negative.  Fact Sheet for Patients:  BloggerCourse.com  Fact Sheet for Healthcare Providers:  SeriousBroker.it  This test is no t yet approved or cleared by the [place] FDA and  has been authorized for detection and/or diagnosis of SARS-CoV-2 by FDA under an Emergency Use Authorization (EUA). This EUA will remain  in effect (meaning this test can be used) for the duration of the COVID-19 declaration under Section 564(b)(1) of the Act, 21 U.S.C.section 360bbb-3(b)(1), unless the authorization is terminated  or revoked sooner.       Influenza A by PCR NEGATIVE NEGATIVE Final   Influenza B by PCR NEGATIVE NEGATIVE Final    Comment: (NOTE) The Xpert Xpress SARS-CoV-2/FLU/RSV plus assay is intended as an aid in the diagnosis of influenza from Nasopharyngeal swab specimens and should not be used as a sole basis for treatment. Nasal  washings and aspirates are unacceptable for Xpert Xpress SARS-CoV-2/FLU/RSV testing.  Fact Sheet for Patients: BloggerCourse.com  Fact Sheet for Healthcare Providers: SeriousBroker.it  This test is not yet approved or cleared by the [place] FDA and has been authorized for detection and/or diagnosis of SARS-CoV-2 by FDA under an Emergency Use Authorization (EUA). This EUA will remain in effect (meaning this test can be used) for the duration of the COVID-19 declaration under Section  564(b)(1) of the Act, 21 U.S.C. section 360bbb-3(b)(1), unless the authorization is terminated or revoked.     Resp Syncytial Virus by PCR NEGATIVE NEGATIVE Final    Comment: (NOTE) Fact Sheet for Patients: BloggerCourse.com  Fact Sheet for Healthcare Providers: SeriousBroker.it  This test is not yet approved or cleared by the [place] FDA and has been authorized for detection and/or diagnosis of SARS-CoV-2 by FDA under an Emergency Use Authorization (EUA). This EUA will remain in effect (meaning this test can be used) for the duration of the COVID-19 declaration under Section 564(b)(1) of the Act, 21 U.S.C. section 360bbb-3(b)(1), unless the authorization is terminated or revoked.  Performed at [facility], [address], [place], [place] [address]      Time coordinating discharge: 25 Minutes  SIGNED: Lanae Boast, MD  [facility] 03/14/2023, 11:50 AM  If 7PM-7AM, please contact night-coverage [email or web address]

## 2023-03-14 NOTE — Care Plan (Signed)
 Pt received, educated on, and understands AVS and discharge instructions.  Pt has all belongings.  Pt received doctor's note for work.

## 2023-03-18 LAB — OPIATE, QUANTITATIVE, URINE
Codeine: NEGATIVE
Hydrocodone: NEGATIVE
Hydromorphone: NEGATIVE
Morphine, Confirm: 1019 ng/mL
Morphine: POSITIVE — AB
OXYCODONE+OXYMORPHONE UR QL SCN: NEGATIVE
Opiates: POSITIVE — AB
